# Patient Record
Sex: Male | Born: 1940 | Race: White | Hispanic: No | Marital: Married | Smoking: Never smoker
Health system: Southern US, Community
[De-identification: ages and names within clinical notes are randomized; demographics above are authoritative.]

## PROBLEM LIST (undated history)

## (undated) ENCOUNTER — Emergency Department (HOSPITAL_BASED_OUTPATIENT_CLINIC_OR_DEPARTMENT_OTHER): Payer: Medicare Other | Source: Home / Self Care

## (undated) DIAGNOSIS — I7789 Other specified disorders of arteries and arterioles: Secondary | ICD-10-CM

## (undated) DIAGNOSIS — I499 Cardiac arrhythmia, unspecified: Secondary | ICD-10-CM

## (undated) DIAGNOSIS — F528 Other sexual dysfunction not due to a substance or known physiological condition: Secondary | ICD-10-CM

## (undated) DIAGNOSIS — R0602 Shortness of breath: Secondary | ICD-10-CM

## (undated) DIAGNOSIS — I4891 Unspecified atrial fibrillation: Secondary | ICD-10-CM

## (undated) DIAGNOSIS — R7303 Prediabetes: Secondary | ICD-10-CM

## (undated) DIAGNOSIS — Z952 Presence of prosthetic heart valve: Secondary | ICD-10-CM

## (undated) DIAGNOSIS — I019 Acute rheumatic heart disease, unspecified: Secondary | ICD-10-CM

## (undated) DIAGNOSIS — K5790 Diverticulosis of intestine, part unspecified, without perforation or abscess without bleeding: Secondary | ICD-10-CM

## (undated) DIAGNOSIS — I080 Rheumatic disorders of both mitral and aortic valves: Secondary | ICD-10-CM

## (undated) DIAGNOSIS — G5620 Lesion of ulnar nerve, unspecified upper limb: Secondary | ICD-10-CM

## (undated) DIAGNOSIS — I509 Heart failure, unspecified: Secondary | ICD-10-CM

## (undated) DIAGNOSIS — I712 Thoracic aortic aneurysm, without rupture, unspecified: Secondary | ICD-10-CM

## (undated) DIAGNOSIS — E785 Hyperlipidemia, unspecified: Secondary | ICD-10-CM

## (undated) DIAGNOSIS — I639 Cerebral infarction, unspecified: Secondary | ICD-10-CM

## (undated) DIAGNOSIS — I010 Acute rheumatic pericarditis: Secondary | ICD-10-CM

## (undated) DIAGNOSIS — Z9889 Other specified postprocedural states: Secondary | ICD-10-CM

## (undated) DIAGNOSIS — K644 Residual hemorrhoidal skin tags: Secondary | ICD-10-CM

## (undated) HISTORY — PX: CARDIOVERSION: SHX1299

## (undated) HISTORY — DX: Shortness of breath: R06.02

## (undated) HISTORY — PX: COLONOSCOPY: SHX174

## (undated) HISTORY — DX: Lesion of ulnar nerve, unspecified upper limb: G56.20

## (undated) HISTORY — DX: Hyperlipidemia, unspecified: E78.5

## (undated) HISTORY — DX: Other specified disorders of arteries and arterioles: I77.89

## (undated) HISTORY — PX: AORTIC AND MITRAL VALVE REPLACEMENT: SHX5056

## (undated) HISTORY — DX: Other specified postprocedural states: Z98.890

## (undated) HISTORY — PX: TONSILLECTOMY: SUR1361

## (undated) HISTORY — DX: Diverticulosis of intestine, part unspecified, without perforation or abscess without bleeding: K57.90

## (undated) HISTORY — DX: Rheumatic disorders of both mitral and aortic valves: I08.0

## (undated) HISTORY — DX: Acute rheumatic pericarditis: I01.0

## (undated) HISTORY — DX: Cerebral infarction, unspecified: I63.9

## (undated) HISTORY — DX: Acute rheumatic heart disease, unspecified: I01.9

## (undated) HISTORY — DX: Unspecified atrial fibrillation: I48.91

## (undated) HISTORY — DX: Residual hemorrhoidal skin tags: K64.4

## (undated) HISTORY — DX: Other sexual dysfunction not due to a substance or known physiological condition: F52.8

## (undated) HISTORY — PX: OTHER SURGICAL HISTORY: SHX169

## (undated) SURGERY — CARDIOVERSION
Anesthesia: Monitor Anesthesia Care

---

## 1997-11-13 ENCOUNTER — Ambulatory Visit (HOSPITAL_COMMUNITY): Admission: RE | Admit: 1997-11-13 | Discharge: 1997-11-13 | Payer: Self-pay | Admitting: Cardiology

## 1999-02-10 ENCOUNTER — Other Ambulatory Visit: Admission: RE | Admit: 1999-02-10 | Discharge: 1999-02-10 | Payer: Self-pay | Admitting: Gastroenterology

## 1999-02-10 ENCOUNTER — Encounter (INDEPENDENT_AMBULATORY_CARE_PROVIDER_SITE_OTHER): Payer: Self-pay | Admitting: Specialist

## 2002-07-14 ENCOUNTER — Encounter: Payer: Self-pay | Admitting: Emergency Medicine

## 2002-07-14 ENCOUNTER — Inpatient Hospital Stay (HOSPITAL_COMMUNITY): Admission: EM | Admit: 2002-07-14 | Discharge: 2002-07-16 | Payer: Self-pay | Admitting: Emergency Medicine

## 2002-07-15 ENCOUNTER — Encounter: Payer: Self-pay | Admitting: Internal Medicine

## 2004-01-06 ENCOUNTER — Ambulatory Visit: Payer: Self-pay

## 2004-01-15 ENCOUNTER — Ambulatory Visit: Payer: Self-pay | Admitting: Internal Medicine

## 2004-01-20 ENCOUNTER — Emergency Department (HOSPITAL_COMMUNITY): Admission: EM | Admit: 2004-01-20 | Discharge: 2004-01-20 | Payer: Self-pay | Admitting: Emergency Medicine

## 2004-01-21 ENCOUNTER — Ambulatory Visit: Payer: Self-pay

## 2004-02-25 ENCOUNTER — Ambulatory Visit: Payer: Self-pay | Admitting: Internal Medicine

## 2004-03-09 ENCOUNTER — Ambulatory Visit: Payer: Self-pay

## 2004-03-23 ENCOUNTER — Ambulatory Visit: Payer: Self-pay | Admitting: Internal Medicine

## 2004-03-25 ENCOUNTER — Ambulatory Visit: Payer: Self-pay | Admitting: Cardiology

## 2004-04-01 ENCOUNTER — Ambulatory Visit: Payer: Self-pay | Admitting: Internal Medicine

## 2004-04-27 ENCOUNTER — Ambulatory Visit: Payer: Self-pay | Admitting: *Deleted

## 2004-04-30 ENCOUNTER — Ambulatory Visit: Payer: Self-pay

## 2004-05-11 ENCOUNTER — Ambulatory Visit: Payer: Self-pay | Admitting: Cardiovascular Disease

## 2004-05-27 ENCOUNTER — Ambulatory Visit: Payer: Self-pay | Admitting: Cardiology

## 2004-06-01 ENCOUNTER — Emergency Department (HOSPITAL_COMMUNITY): Admission: EM | Admit: 2004-06-01 | Discharge: 2004-06-02 | Payer: Self-pay | Admitting: Emergency Medicine

## 2004-06-02 ENCOUNTER — Emergency Department (HOSPITAL_COMMUNITY): Admission: EM | Admit: 2004-06-02 | Discharge: 2004-06-02 | Payer: Self-pay | Admitting: Emergency Medicine

## 2004-06-24 ENCOUNTER — Ambulatory Visit: Payer: Self-pay | Admitting: Internal Medicine

## 2004-06-26 ENCOUNTER — Emergency Department (HOSPITAL_COMMUNITY): Admission: EM | Admit: 2004-06-26 | Discharge: 2004-06-26 | Payer: Self-pay | Admitting: Emergency Medicine

## 2004-07-13 ENCOUNTER — Ambulatory Visit: Payer: Self-pay | Admitting: Cardiology

## 2004-07-26 ENCOUNTER — Ambulatory Visit: Payer: Self-pay | Admitting: *Deleted

## 2004-07-27 ENCOUNTER — Observation Stay (HOSPITAL_COMMUNITY): Admission: EM | Admit: 2004-07-27 | Discharge: 2004-07-28 | Payer: Self-pay | Admitting: Emergency Medicine

## 2004-07-28 ENCOUNTER — Encounter: Payer: Self-pay | Admitting: Cardiology

## 2004-08-04 ENCOUNTER — Ambulatory Visit: Payer: Self-pay | Admitting: Cardiology

## 2004-08-18 ENCOUNTER — Ambulatory Visit: Payer: Self-pay | Admitting: Cardiology

## 2004-08-19 ENCOUNTER — Ambulatory Visit: Payer: Self-pay

## 2004-08-25 ENCOUNTER — Ambulatory Visit: Payer: Self-pay | Admitting: Cardiology

## 2004-09-08 ENCOUNTER — Ambulatory Visit: Payer: Self-pay | Admitting: Internal Medicine

## 2004-09-13 ENCOUNTER — Ambulatory Visit: Payer: Self-pay | Admitting: Cardiology

## 2004-09-13 ENCOUNTER — Ambulatory Visit: Payer: Self-pay

## 2004-09-14 ENCOUNTER — Ambulatory Visit: Payer: Self-pay | Admitting: Cardiology

## 2004-09-30 ENCOUNTER — Ambulatory Visit: Payer: Self-pay | Admitting: Internal Medicine

## 2004-10-04 ENCOUNTER — Ambulatory Visit: Payer: Self-pay | Admitting: Internal Medicine

## 2004-10-07 ENCOUNTER — Ambulatory Visit: Payer: Self-pay | Admitting: Internal Medicine

## 2004-10-07 ENCOUNTER — Ambulatory Visit: Payer: Self-pay | Admitting: Cardiology

## 2004-10-12 ENCOUNTER — Ambulatory Visit: Payer: Self-pay | Admitting: Internal Medicine

## 2004-10-14 ENCOUNTER — Ambulatory Visit: Payer: Self-pay | Admitting: Internal Medicine

## 2004-10-18 ENCOUNTER — Ambulatory Visit: Payer: Self-pay | Admitting: Cardiology

## 2004-10-26 ENCOUNTER — Ambulatory Visit: Payer: Self-pay | Admitting: Internal Medicine

## 2004-10-27 ENCOUNTER — Ambulatory Visit: Payer: Self-pay | Admitting: Cardiology

## 2004-11-03 ENCOUNTER — Ambulatory Visit: Payer: Self-pay | Admitting: Cardiology

## 2004-11-03 ENCOUNTER — Ambulatory Visit: Payer: Self-pay | Admitting: *Deleted

## 2004-11-11 ENCOUNTER — Encounter (HOSPITAL_COMMUNITY): Admission: RE | Admit: 2004-11-11 | Discharge: 2005-01-29 | Payer: Self-pay | Admitting: Cardiology

## 2004-11-18 ENCOUNTER — Ambulatory Visit: Payer: Self-pay | Admitting: Internal Medicine

## 2004-12-02 ENCOUNTER — Ambulatory Visit: Payer: Self-pay | Admitting: Cardiology

## 2004-12-07 ENCOUNTER — Ambulatory Visit: Payer: Self-pay | Admitting: Cardiology

## 2004-12-15 ENCOUNTER — Ambulatory Visit: Payer: Self-pay | Admitting: Cardiology

## 2005-01-12 ENCOUNTER — Ambulatory Visit: Payer: Self-pay | Admitting: Cardiology

## 2005-02-16 ENCOUNTER — Ambulatory Visit: Payer: Self-pay | Admitting: Cardiology

## 2005-03-09 ENCOUNTER — Ambulatory Visit (HOSPITAL_COMMUNITY): Admission: RE | Admit: 2005-03-09 | Discharge: 2005-03-10 | Payer: Self-pay | Admitting: Neurosurgery

## 2005-03-14 ENCOUNTER — Encounter: Payer: Self-pay | Admitting: Cardiology

## 2005-03-14 ENCOUNTER — Ambulatory Visit: Payer: Self-pay

## 2005-03-16 ENCOUNTER — Ambulatory Visit: Payer: Self-pay | Admitting: Cardiology

## 2005-05-26 ENCOUNTER — Ambulatory Visit: Payer: Self-pay | Admitting: Cardiology

## 2005-07-28 ENCOUNTER — Ambulatory Visit: Payer: Self-pay | Admitting: Cardiology

## 2005-08-04 ENCOUNTER — Ambulatory Visit: Payer: Self-pay | Admitting: Cardiology

## 2006-02-09 ENCOUNTER — Ambulatory Visit: Payer: Self-pay | Admitting: Cardiology

## 2006-02-17 ENCOUNTER — Ambulatory Visit: Payer: Self-pay | Admitting: Cardiology

## 2006-03-14 ENCOUNTER — Ambulatory Visit: Payer: Self-pay

## 2006-03-14 ENCOUNTER — Encounter: Payer: Self-pay | Admitting: Cardiology

## 2006-08-31 ENCOUNTER — Ambulatory Visit: Payer: Self-pay | Admitting: Cardiology

## 2006-08-31 LAB — CONVERTED CEMR LAB
ALT: 64 units/L — ABNORMAL HIGH (ref 0–53)
AST: 48 units/L — ABNORMAL HIGH (ref 0–37)
Albumin: 4 g/dL (ref 3.5–5.2)
Alkaline Phosphatase: 64 units/L (ref 39–117)
Bilirubin, Direct: 0.1 mg/dL (ref 0.0–0.3)
Total Bilirubin: 1.2 mg/dL (ref 0.3–1.2)
Total Protein: 7.1 g/dL (ref 6.0–8.3)

## 2006-09-07 ENCOUNTER — Ambulatory Visit: Payer: Self-pay | Admitting: Cardiology

## 2006-09-11 ENCOUNTER — Ambulatory Visit: Payer: Self-pay | Admitting: Cardiology

## 2006-09-11 LAB — CONVERTED CEMR LAB
BUN: 22 mg/dL (ref 6–23)
Basophils Absolute: 0.1 10*3/uL (ref 0.0–0.1)
Basophils Relative: 1 % (ref 0.0–1.0)
CO2: 31 meq/L (ref 19–32)
Calcium: 8.9 mg/dL (ref 8.4–10.5)
Chloride: 111 meq/L (ref 96–112)
Creatinine, Ser: 1 mg/dL (ref 0.4–1.5)
Eosinophils Absolute: 0.2 10*3/uL (ref 0.0–0.6)
Eosinophils Relative: 3.1 % (ref 0.0–5.0)
GFR calc Af Amer: 97 mL/min
GFR calc non Af Amer: 80 mL/min
Glucose, Bld: 108 mg/dL — ABNORMAL HIGH (ref 70–99)
HCT: 41.6 % (ref 39.0–52.0)
Hemoglobin: 14.1 g/dL (ref 13.0–17.0)
Lymphocytes Relative: 28.6 % (ref 12.0–46.0)
MCHC: 33.8 g/dL (ref 30.0–36.0)
MCV: 93.3 fL (ref 78.0–100.0)
Monocytes Absolute: 0.6 10*3/uL (ref 0.2–0.7)
Monocytes Relative: 8.9 % (ref 3.0–11.0)
Neutro Abs: 3.6 10*3/uL (ref 1.4–7.7)
Neutrophils Relative %: 58.4 % (ref 43.0–77.0)
Platelets: 259 10*3/uL (ref 150–400)
Potassium: 4.4 meq/L (ref 3.5–5.1)
RBC: 4.46 M/uL (ref 4.22–5.81)
RDW: 11.7 % (ref 11.5–14.6)
Sodium: 145 meq/L (ref 135–145)
WBC: 6.3 10*3/uL (ref 4.5–10.5)

## 2007-03-14 ENCOUNTER — Ambulatory Visit: Payer: Self-pay

## 2007-03-20 ENCOUNTER — Ambulatory Visit: Payer: Self-pay | Admitting: Cardiology

## 2007-03-20 LAB — CONVERTED CEMR LAB
ALT: 49 units/L (ref 0–53)
AST: 43 units/L — ABNORMAL HIGH (ref 0–37)
Albumin: 4.3 g/dL (ref 3.5–5.2)
Alkaline Phosphatase: 78 units/L (ref 39–117)
BUN: 22 mg/dL (ref 6–23)
Bilirubin, Direct: 0.1 mg/dL (ref 0.0–0.3)
CO2: 24 meq/L (ref 19–32)
Calcium: 9.5 mg/dL (ref 8.4–10.5)
Chloride: 105 meq/L (ref 96–112)
Cholesterol: 134 mg/dL (ref 0–200)
Creatinine, Ser: 1.1 mg/dL (ref 0.4–1.5)
GFR calc Af Amer: 87 mL/min
GFR calc non Af Amer: 72 mL/min
Glucose, Bld: 91 mg/dL (ref 70–99)
HDL: 34.4 mg/dL — ABNORMAL LOW (ref >39.0)
LDL Cholesterol: 83 mg/dL (ref 0–99)
Potassium: 4.3 meq/L (ref 3.5–5.1)
Sodium: 139 meq/L (ref 135–145)
Total Bilirubin: 1.5 mg/dL — ABNORMAL HIGH (ref 0.3–1.2)
Total CHOL/HDL Ratio: 3.9
Total Protein: 7.9 g/dL (ref 6.0–8.3)
Triglycerides: 85 mg/dL (ref 0–149)
VLDL: 17 mg/dL (ref 0–40)

## 2007-03-26 ENCOUNTER — Ambulatory Visit: Payer: Self-pay | Admitting: Cardiology

## 2007-05-29 ENCOUNTER — Ambulatory Visit: Payer: Self-pay | Admitting: Gastroenterology

## 2007-06-29 ENCOUNTER — Encounter: Payer: Self-pay | Admitting: Internal Medicine

## 2007-06-29 ENCOUNTER — Ambulatory Visit: Payer: Self-pay | Admitting: Gastroenterology

## 2007-09-24 ENCOUNTER — Ambulatory Visit: Payer: Self-pay | Admitting: Internal Medicine

## 2007-09-24 DIAGNOSIS — I019 Acute rheumatic heart disease, unspecified: Secondary | ICD-10-CM | POA: Insufficient documentation

## 2007-09-24 DIAGNOSIS — I480 Paroxysmal atrial fibrillation: Secondary | ICD-10-CM | POA: Insufficient documentation

## 2007-09-25 ENCOUNTER — Ambulatory Visit: Payer: Self-pay | Admitting: Cardiology

## 2007-09-25 DIAGNOSIS — I080 Rheumatic disorders of both mitral and aortic valves: Secondary | ICD-10-CM | POA: Insufficient documentation

## 2007-09-25 DIAGNOSIS — K644 Residual hemorrhoidal skin tags: Secondary | ICD-10-CM | POA: Insufficient documentation

## 2007-09-25 DIAGNOSIS — G5620 Lesion of ulnar nerve, unspecified upper limb: Secondary | ICD-10-CM | POA: Insufficient documentation

## 2007-09-25 DIAGNOSIS — F528 Other sexual dysfunction not due to a substance or known physiological condition: Secondary | ICD-10-CM | POA: Insufficient documentation

## 2007-09-25 DIAGNOSIS — I010 Acute rheumatic pericarditis: Secondary | ICD-10-CM | POA: Insufficient documentation

## 2008-01-04 ENCOUNTER — Telehealth: Payer: Self-pay | Admitting: Internal Medicine

## 2008-01-09 ENCOUNTER — Ambulatory Visit: Payer: Self-pay | Admitting: Cardiology

## 2008-01-09 LAB — CONVERTED CEMR LAB
BUN: 20 mg/dL (ref 6–23)
CO2: 28 meq/L (ref 19–32)
Calcium: 9.4 mg/dL (ref 8.4–10.5)
Chloride: 106 meq/L (ref 96–112)
Creatinine, Ser: 1 mg/dL (ref 0.4–1.5)
GFR calc Af Amer: 96 mL/min
GFR calc non Af Amer: 80 mL/min
Glucose, Bld: 104 mg/dL — ABNORMAL HIGH (ref 70–99)
Potassium: 4.5 meq/L (ref 3.5–5.1)
Sodium: 140 meq/L (ref 135–145)
TSH: 2.13 microintl units/mL (ref 0.35–5.50)

## 2008-01-10 ENCOUNTER — Ambulatory Visit: Payer: Self-pay

## 2008-01-28 ENCOUNTER — Ambulatory Visit: Payer: Self-pay | Admitting: Cardiology

## 2008-03-27 ENCOUNTER — Encounter: Payer: Self-pay | Admitting: Internal Medicine

## 2008-05-05 ENCOUNTER — Ambulatory Visit: Payer: Self-pay | Admitting: Internal Medicine

## 2008-06-16 DIAGNOSIS — Z86010 Personal history of colonic polyps: Secondary | ICD-10-CM | POA: Insufficient documentation

## 2008-06-17 ENCOUNTER — Ambulatory Visit: Payer: Self-pay | Admitting: Gastroenterology

## 2008-06-17 DIAGNOSIS — K5730 Diverticulosis of large intestine without perforation or abscess without bleeding: Secondary | ICD-10-CM | POA: Insufficient documentation

## 2008-07-03 ENCOUNTER — Ambulatory Visit: Payer: Self-pay

## 2008-07-03 ENCOUNTER — Encounter: Payer: Self-pay | Admitting: Cardiology

## 2008-07-07 DIAGNOSIS — I471 Supraventricular tachycardia, unspecified: Secondary | ICD-10-CM | POA: Insufficient documentation

## 2008-07-07 DIAGNOSIS — Z9889 Other specified postprocedural states: Secondary | ICD-10-CM | POA: Insufficient documentation

## 2008-07-07 DIAGNOSIS — E785 Hyperlipidemia, unspecified: Secondary | ICD-10-CM | POA: Insufficient documentation

## 2008-07-08 ENCOUNTER — Ambulatory Visit: Payer: Self-pay | Admitting: Cardiology

## 2008-07-10 ENCOUNTER — Ambulatory Visit: Payer: Self-pay | Admitting: Cardiology

## 2008-07-10 DIAGNOSIS — I4891 Unspecified atrial fibrillation: Secondary | ICD-10-CM | POA: Insufficient documentation

## 2008-07-10 DIAGNOSIS — I4819 Other persistent atrial fibrillation: Secondary | ICD-10-CM | POA: Insufficient documentation

## 2008-07-10 LAB — CONVERTED CEMR LAB
ALT: 49 units/L (ref 0–53)
AST: 42 units/L — ABNORMAL HIGH (ref 0–37)
Albumin: 3.8 g/dL (ref 3.5–5.2)
Alkaline Phosphatase: 59 units/L (ref 39–117)
Bilirubin, Direct: 0.1 mg/dL (ref 0.0–0.3)
Cholesterol: 144 mg/dL (ref 0–200)
HDL: 39.5 mg/dL (ref >39.00)
LDL Cholesterol: 88 mg/dL (ref 0–99)
Total Bilirubin: 1.1 mg/dL (ref 0.3–1.2)
Total CHOL/HDL Ratio: 4
Total Protein: 6.7 g/dL (ref 6.0–8.3)
Triglycerides: 83 mg/dL (ref 0.0–149.0)
VLDL: 16.6 mg/dL (ref 0.0–40.0)

## 2008-12-25 ENCOUNTER — Ambulatory Visit: Payer: Self-pay | Admitting: Internal Medicine

## 2008-12-26 LAB — CONVERTED CEMR LAB
ALT: 50 units/L (ref 0–53)
AST: 35 units/L (ref 0–37)
Albumin: 4.1 g/dL (ref 3.5–5.2)
Alkaline Phosphatase: 56 units/L (ref 39–117)
BUN: 19 mg/dL (ref 6–23)
Bilirubin, Direct: 0.1 mg/dL (ref 0.0–0.3)
CO2: 28 meq/L (ref 19–32)
Calcium: 8.7 mg/dL (ref 8.4–10.5)
Chloride: 105 meq/L (ref 96–112)
Cholesterol: 147 mg/dL (ref 0–200)
Creatinine, Ser: 0.9 mg/dL (ref 0.4–1.5)
GFR calc non Af Amer: 89.67 mL/min (ref >60)
Glucose, Bld: 103 mg/dL — ABNORMAL HIGH (ref 70–99)
HDL: 43.3 mg/dL (ref >39.00)
LDL Cholesterol: 88 mg/dL (ref 0–99)
PSA: 2.87 ng/mL (ref 0.10–4.00)
Potassium: 4.2 meq/L (ref 3.5–5.1)
Sodium: 141 meq/L (ref 135–145)
TSH: 2.24 microintl units/mL (ref 0.35–5.50)
Total Bilirubin: 1.1 mg/dL (ref 0.3–1.2)
Total CHOL/HDL Ratio: 3
Total Protein: 7 g/dL (ref 6.0–8.3)
Triglycerides: 77 mg/dL (ref 0.0–149.0)
VLDL: 15.4 mg/dL (ref 0.0–40.0)

## 2009-01-21 ENCOUNTER — Ambulatory Visit: Payer: Self-pay | Admitting: Internal Medicine

## 2009-01-21 DIAGNOSIS — J069 Acute upper respiratory infection, unspecified: Secondary | ICD-10-CM | POA: Insufficient documentation

## 2009-01-23 ENCOUNTER — Ambulatory Visit: Payer: Self-pay | Admitting: Internal Medicine

## 2009-01-27 ENCOUNTER — Ambulatory Visit: Payer: Self-pay | Admitting: Cardiology

## 2009-04-10 ENCOUNTER — Encounter: Payer: Self-pay | Admitting: Internal Medicine

## 2009-04-10 ENCOUNTER — Ambulatory Visit: Payer: Self-pay | Admitting: Cardiology

## 2009-04-10 DIAGNOSIS — R5383 Other fatigue: Secondary | ICD-10-CM

## 2009-04-10 DIAGNOSIS — R5381 Other malaise: Secondary | ICD-10-CM | POA: Insufficient documentation

## 2009-04-10 LAB — CONVERTED CEMR LAB
Basophils Absolute: 0 10*3/uL (ref 0.0–0.1)
Basophils Relative: 0 % (ref 0–1)
Eosinophils Absolute: 0.2 10*3/uL (ref 0.0–0.7)
Eosinophils Relative: 3 % (ref 0–5)
HCT: 40.9 % (ref 39.0–52.0)
Hemoglobin: 13.7 g/dL (ref 13.0–17.0)
Lymphocytes Relative: 26 % (ref 12–46)
Lymphs Abs: 2.4 10*3/uL (ref 0.7–4.0)
MCHC: 33.5 g/dL (ref 30.0–36.0)
MCV: 92.1 fL (ref 78.0–100.0)
Monocytes Absolute: 0.9 10*3/uL (ref 0.1–1.0)
Monocytes Relative: 10 % (ref 3–12)
Neutro Abs: 5.6 10*3/uL (ref 1.7–7.7)
Neutrophils Relative %: 61 % (ref 43–77)
Platelets: 255 10*3/uL (ref 150–400)
RBC: 4.44 M/uL (ref 4.22–5.81)
RDW: 13.7 % (ref 11.5–15.5)
WBC: 9.2 10*3/uL (ref 4.0–10.5)

## 2009-05-05 ENCOUNTER — Telehealth: Payer: Self-pay | Admitting: Cardiology

## 2009-07-23 ENCOUNTER — Ambulatory Visit (HOSPITAL_COMMUNITY): Admission: RE | Admit: 2009-07-23 | Discharge: 2009-07-23 | Payer: Self-pay | Admitting: Cardiology

## 2009-07-23 ENCOUNTER — Encounter: Payer: Self-pay | Admitting: Cardiology

## 2009-07-23 ENCOUNTER — Ambulatory Visit: Payer: Self-pay | Admitting: Internal Medicine

## 2009-07-23 ENCOUNTER — Ambulatory Visit: Payer: Self-pay

## 2009-07-28 ENCOUNTER — Ambulatory Visit: Payer: Self-pay | Admitting: Cardiology

## 2009-08-11 ENCOUNTER — Ambulatory Visit: Payer: Self-pay | Admitting: Cardiology

## 2009-08-12 LAB — CONVERTED CEMR LAB
ALT: 34 units/L (ref 0–53)
AST: 33 units/L (ref 0–37)
Albumin: 4 g/dL (ref 3.5–5.2)
Alkaline Phosphatase: 55 units/L (ref 39–117)
BUN: 21 mg/dL (ref 6–23)
Basophils Absolute: 0.1 10*3/uL (ref 0.0–0.1)
Basophils Relative: 0.9 % (ref 0.0–3.0)
Bilirubin, Direct: 0.2 mg/dL (ref 0.0–0.3)
CO2: 26 meq/L (ref 19–32)
Calcium: 9.1 mg/dL (ref 8.4–10.5)
Chloride: 109 meq/L (ref 96–112)
Cholesterol: 168 mg/dL (ref 0–200)
Creatinine, Ser: 0.8 mg/dL (ref 0.4–1.5)
Eosinophils Absolute: 0.3 10*3/uL (ref 0.0–0.7)
Eosinophils Relative: 4.1 % (ref 0.0–5.0)
GFR calc non Af Amer: 102.53 mL/min (ref >60)
Glucose, Bld: 101 mg/dL — ABNORMAL HIGH (ref 70–99)
HCT: 39.5 % (ref 39.0–52.0)
HDL: 48.7 mg/dL (ref >39.00)
Hemoglobin: 13.7 g/dL (ref 13.0–17.0)
LDL Cholesterol: 99 mg/dL (ref 0–99)
Lymphocytes Relative: 25.2 % (ref 12.0–46.0)
Lymphs Abs: 1.9 10*3/uL (ref 0.7–4.0)
MCHC: 34.7 g/dL (ref 30.0–36.0)
MCV: 94.1 fL (ref 78.0–100.0)
Monocytes Absolute: 0.6 10*3/uL (ref 0.1–1.0)
Monocytes Relative: 7.9 % (ref 3.0–12.0)
Neutro Abs: 4.7 10*3/uL (ref 1.4–7.7)
Neutrophils Relative %: 61.9 % (ref 43.0–77.0)
Platelets: 225 10*3/uL (ref 150.0–400.0)
Potassium: 4.2 meq/L (ref 3.5–5.1)
RBC: 4.2 M/uL — ABNORMAL LOW (ref 4.22–5.81)
RDW: 13.3 % (ref 11.5–14.6)
Sodium: 143 meq/L (ref 135–145)
Total Bilirubin: 0.8 mg/dL (ref 0.3–1.2)
Total CHOL/HDL Ratio: 3
Total Protein: 6.3 g/dL (ref 6.0–8.3)
Triglycerides: 104 mg/dL (ref 0.0–149.0)
VLDL: 20.8 mg/dL (ref 0.0–40.0)
WBC: 7.6 10*3/uL (ref 4.5–10.5)

## 2010-01-05 ENCOUNTER — Ambulatory Visit: Payer: Self-pay | Admitting: Internal Medicine

## 2010-01-25 ENCOUNTER — Ambulatory Visit: Payer: Self-pay | Admitting: Cardiology

## 2010-03-12 ENCOUNTER — Telehealth: Payer: Self-pay | Admitting: Cardiology

## 2010-04-08 NOTE — Assessment & Plan Note (Signed)
Summary: f59m   Visit Type:  Follow-up Primary Provider:  Jacques Navy MD   History of Present Illness: Patient is 70 years old and return for management of valvular heart disease. 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 Tissue valves were used. The procedure was done for aortic and mitral insufficiency and recent onset CHF. He had a history of atrial fibrillation preoperatively but has not had any documented atrial fibrillation postoperatively. He has had some episodes of atrial tachycardia and short runs of nonsustained ventricular tachycardia. He has been managed with Toprol for this. We increased his dose to 200 but his heart rate got to slowly cut him back to 150 mg daily.  He has been feeling well and exercising regularly. He does have occasional runs of palpitations but these only last a short time and not very bothersome.  I talked to him about seeing Dr. Shirlee Latch next year after I retire.  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 Alford Highland nephew, Landis Gandy, was a partner in his firm in [place] office. The patient also notes a history of the [place] migration of the [place] to [place].  Current Medications (verified): 1)  Crestor 20 Mg Tabs (Rosuvastatin Calcium) .... Take One Tablet By Mouth Daily. 2)  Metoprolol Succinate 100 Mg Xr24h-Tab (Metoprolol Succinate) .... Take 1 & 1/2 Tabs By Mouth Once Daily 3)  Aspirin 81 Mg  Tabs (Aspirin) .... Take 1 Tablet By Mouth Once A Day 4)  Vitamin-B Complex   Tabs (B Complex Vitamins) .... Take 1 Tablet By Mouth Once A Day 5)  Coq- 10 300 Mg .... Take 1 Tablet By Mouth Once A Day 6)  Bilberry 100 Mg Caps (Bilberry (Vaccinium Myrtillus)) .... Take One Tab By Mouth Once Daily 7)  Saw Palmetto 160 Mg  Tabs (Saw Palmetto (Serenoa Repens)) .... Take One Tablet By Mouth Once A Day 8)  Multivitamins   Tabs (Multiple Vitamin) .... Take 1  Tablet By Mouth Once A Day 9)  Viagra 100 Mg Tabs (Sildenafil Citrate) .... 1/2 To 1 Once Daily As Needed 10)  Fish Oil 1000 Mg Caps (Omega-3 Fatty Acids) .... Take 1 Capsule By Mouth Three Times A Day 11)  Vitamin B-6 100 Mg Tabs (Pyridoxine Hcl) .... Take 1 Tablet By Mouth Once A Day 12)  Pramosone 1-2.5 % Crea (Pramoxine-Hc) .... Apply Two Times A Day As Needed For Hemorrhoids.  Allergies: 1)  ! Naprosyn  Past History:  Past Medical History: Reviewed history from 01/05/2010 and no changes required. 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  1. Atrial tachycardia and NSVT 05/2008 Consult [facility] 2. 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 3. History of atrial fibrillation prior to valvular heart surgery, now     off Coumadin. 4. Hyperlipidemia. 5. History of mild elevation of liver function tests. 6. History of remote pericarditis. 7. Status post lumbar disk surgery.   Physician Roster:  cardiology - Dr. Shirlee Latch               dermatology - Dr. Dorinda Hill               GI- Dr. Jarold Motto               Opthal - Sallie MIller OD               NS - Dr. Trey Sailors  Review of Systems       ROS is negative except as outlined in HPI.   Vital Signs:  Patient profile:   70 year old male Height:      68 inches Weight:      200 pounds BMI:     30.52 Pulse rate:   66 / minute BP sitting:   130 / 80  (left arm)  Vitals Entered By: Laurance Flatten CMA  (January 25, 2010 3:14 PM)  Physical Exam  Additional Exam:  Gen. Well-nourished, in no distress   Neck: No JVD, thyroid not enlarged, no carotid bruits Lungs: No tachypnea, clear without rales, rhonchi or wheezes Cardiovascular: Rhythm regular, PMI not displaced,  heart sounds  normal, grade 2/6 systolic ejection murmur at the left sternal edge, no peripheral edema, pulses normal in all 4 extremities. Abdomen: BS normal, abdomen soft and non-tender without masses or organomegaly, no hepatosplenomegaly. MS: No deformities, no cyanosis or clubbing   Neuro:  No focal sns   Skin:  no lesions    Impression & Recommendations:  Problem # 1:  HEART VALVE REPLACEMENT, HX OF (ICD-V15.1) the patient is status post aortic and mitral valve replacement at the [facility] clinic with tissue valves. His LV function was slightly depressed after surgery but has returned to normal. Is not having any symptoms and this problem appears stable.  Problem # 2:  ATRIAL FIBRILLATION (ICD-427.31) He had a history of atrial fibrillation prior to surgery. He had a Maze procedure during surgery. He has had some short episodes of SVT and very short runs of VT since surgery documented by monitoring. He's had no recent palpitations and this problem appears stable. He is off Coumadin. His updated medication list for this problem includes:    Metoprolol Succinate 100 Mg Xr24h-tab (Metoprolol succinate) .[name]... Take 1 & 1/2 tabs by mouth once daily    Aspirin 81 Mg Tabs (Aspirin) .[name]... Take 1 tablet by mouth once a day  Problem # 3:  HYPERLIPIDEMIA-MIXED (ICD-272.4) This is managed with Crestor. His updated medication list for this problem includes:    Crestor 20 Mg Tabs (Rosuvastatin calcium) .[name]... Take one tablet by mouth daily.  Other Orders: EKG w/ Interpretation (93000)  Patient Instructions: 1)  Your physician wants you to follow-up in: 6 months with Dr. Shirlee Latch.  You will receive a reminder letter in the mail two  months in advance. If you don't receive a letter, please call our office to schedule the follow-up appointment. 2)  Your physician recommends that you continue on your current medications as directed. Please refer to the Current Medication list given to you today. Prescriptions: METOPROLOL SUCCINATE 100 MG XR24H-TAB (METOPROLOL SUCCINATE) take 1 & 1/2 tabs by mouth once daily  #135 x 3   Entered by:   Sherri Rad, RN, BSN   Authorized by:   Lenoria Farrier, MD, [facility]   Signed by:   Sherri Rad, RN, BSN on 01/25/2010   Method used:   Faxed to .Marland KitchenMarland Kitchen  [facility] (mail-order)             , [place]         Ph: [phone]       Fax: [phone]   RxID:   [id number] CRESTOR 20 MG TABS (ROSUVASTATIN CALCIUM) Take one tablet by mouth daily.  #90 x 3   Entered by:   Sherri Rad, RN, BSN   Authorized by:   Lenoria Farrier, MD, [facility]   Signed by:   Sherri Rad, RN, BSN on 01/25/2010   Method used:   Faxed to ...       [facility] (mail-order)             , [place]         Ph: [phone]       Fax: [phone]   RxID:   [id number]

## 2010-04-08 NOTE — Progress Notes (Signed)
Summary: pt rtn call on medication   Phone Note Call from Patient Call back at [phone]   Caller: Patient Reason for Call: Talk to Nurse, Talk to Doctor Summary of Call: pt calling regarding medication. per pt Dr. Juanda Chance called him and asked him to get in touch with heather m/lg Initial call taken by: Omer Jack,  May 05, 2009 10:46 AM  Follow-up for Phone Call        I attempted to call the pt at the number left- VM has not been set up. I will c/b. Sherri Rad, RN, BSN  May 05, 2009 1:09 PM The pt called back today. He stated he did not end up getting the last refill we sent in on Vytorin since he recieved a letter stating that it was no longer covered. He does want to go ahead and change to Crestor. I will verify the dose with Dr. Juanda Chance and send in a new rx to [facility]. I advised the pt he will need a repeat lipid/liver profile 6 weeks after starting Crestor. The pt verbalizes understanding. Sherri Rad, RN, BSN  May 05, 2009 2:05 PM  Per Dr. Juanda Chance, the pt should be on Crestor 20mg  once daily. I will send this to [facility].  Follow-up by: Sherri Rad, RN, BSN,  May 06, 2009 11:04 AM  Additional Follow-up for Phone Call Additional follow up Details #1::        The pt is aware of the dose and that his RX has been sent to [facility]. Additional Follow-up by: Sherri Rad, RN, BSN,  May 06, 2009 11:07 AM    New/Updated Medications: CRESTOR 20 MG TABS (ROSUVASTATIN CALCIUM) Take one tablet by mouth daily. Prescriptions: CRESTOR 20 MG TABS (ROSUVASTATIN CALCIUM) Take one tablet by mouth daily.  #90 x 3   Entered by:   Sherri Rad, RN, BSN   Authorized by:   Lenoria Farrier, MD, [facility]   Signed by:   Sherri Rad, RN, BSN on 05/06/2009   Method used:   Electronically to        [facility] [facility] (mail-order)             ,          Ph: [phone]       Fax: [phone]   RxID:   [id number]

## 2010-04-08 NOTE — Assessment & Plan Note (Signed)
Summary: per check out   Visit Type:  Follow-up Primary Provider:  Jacques Navy MD  CC:  Intermittent episodes of heart racing.[name]  History of Present Illness: Patient is 70 years old and return for management of valvular heart disease. In 2006 he had aortic and mitral valve replacement and a maze procedure at the [facility] clinic. Tissue valves were used. The procedure was done for aortic and mitral insufficiency and recent onset CHF. He had a history of atrial fibrillation preoperatively but has not had any documented atrial fibrillation postoperatively. He has had some episodes of atrial tachycardia and short runs of nonsustained ventricular tachycardia. He has been managed with Toprol for this. We increased his dose to 200 but his heart rate got to slowly cut him back to 150 mg daily.  He has been feeling well and exercising regularly. He does have occasional runs of palpitations but these only last a short time and not very bothersome.  He and his wife are planning a cruise 2 and will stop in [place] on this fall.  I talked to him about seeing Dr. Shirlee Latch next year after I retire.  Current Medications (verified): 1)  Crestor 20 Mg Tabs (Rosuvastatin Calcium) .... Take One Tablet By Mouth Daily. 2)  Metoprolol Succinate 100 Mg Xr24h-Tab (Metoprolol Succinate) .... Take 1 & 1/2 Tabs By Mouth Once Daily 3)  Aspirin 81 Mg  Tabs (Aspirin) .... Take 1 Tablet By Mouth Once A Day 4)  Vitamin-B Complex   Tabs (B Complex Vitamins) .... Take 1 Tablet By Mouth Once A Day 5)  Coq- 10 300 Mg .... Take 1 Tablet By Mouth Once A Day 6)  Bilberry 100 Mg Caps (Bilberry (Vaccinium Myrtillus)) .... Take One Tab By Mouth Once Daily 7)  Saw Palmetto 160 Mg  Tabs (Saw Palmetto (Serenoa Repens)) .... Take One Tablet By Mouth Once A Day 8)  Multivitamins   Tabs (Multiple Vitamin) .... Take 1 Tablet By Mouth Once A Day 9)  Viagra 100 Mg Tabs (Sildenafil Citrate) .... 1/2 To 1 Once Daily As Needed 10)  Fish Oil  1000 Mg Caps (Omega-3 Fatty Acids) .... Take 1 Capsule By Mouth Three Times A Day 11)  Vitamin B-6 100 Mg Tabs (Pyridoxine Hcl) .... Take 1 Tablet By Mouth Once A Day  Allergies (verified): 1)  ! Naprosyn  Past History:  Past Medical History: Reviewed history from 07/07/2008 and no changes required. 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  1. Atrial tachycardia and NSVT 05/2008 Consult [facility] 2. Valvular heart disease status post aortic and mitral valve     replacements with tissue valve and a maze procedure to [facility] in July 2006. 3. History of atrial fibrillation prior to valvular heart surgery, now     off Coumadin. 4. Hyperlipidemia. 5. History of mild elevation of liver function tests. 6. History of remote pericarditis. 7. Status post lumbar disk surgery.   Review of Systems       ROS is negative except as outlined in HPI.   Vital Signs:  Patient profile:   70 year old male Height:      68 inches Weight:  186.50 pounds Pulse rate:   68 / minute BP sitting:   118 / 72  (left arm) Cuff size:   large  Vitals Entered By: Burnett Kanaris, CNA (Jul 28, 2009 11:20 AM)  Physical Exam  Additional Exam:  Gen. Well-nourished, in no distress   Neck: No JVD, thyroid not enlarged, no carotid bruits Lungs: No tachypnea, clear without rales, rhonchi or wheezes Cardiovascular: Rhythm regular, PMI not displaced,  heart sounds  normal, grade 1-2 systolic ejection murmur at the left sternal edge, no peripheral edema, pulses normal in all 4  extremities. Abdomen: BS normal, abdomen soft and non-tender without masses or organomegaly, no hepatosplenomegaly. MS: No deformities, no cyanosis or clubbing   Neuro:  No focal sns   Skin:  no lesions    Impression & Recommendations:  Problem # 1:  HEART VALVE REPLACEMENT, HX OF (ICD-V15.1) He has had aortic and mitral valve replacements with tissue valves and 2005. He was told at the [facility] clinic at the expected these bouts and last about 15 years. He is doing quite well has had no recent symptoms. An echocardiogram prior to this visit which showed an ejection fraction of 60-65% and good valve function. This polyp is stable.  Problem # 2:  ATRIAL FIBRILLATION (ICD-427.31) He has a history of atrial fibrillation preoperatively but none postoperatively. He has had some SVT and does have some symptoms of palpitations but these are not bothersome and are currently being managed with metoprolol. We will continue current therapy. His updated medication list for this problem includes:    Metoprolol Succinate 100 Mg Xr24h-tab (Metoprolol succinate) .[name]... Take 1 & 1/2 tabs by mouth once daily    Aspirin 81 Mg Tabs (Aspirin) .[name]... Take 1 tablet by mouth once a day  Orders: EKG w/ Interpretation (93000)  Problem # 3:  HYPERLIPIDEMIA-MIXED (ICD-272.4) We recently switched him from Vytorin to Crestor because of insurance reasons. We will get a repeat lipid and liver profile in 2 weeks. His updated medication list for this problem includes:    Crestor 20 Mg Tabs (Rosuvastatin calcium) .[name]... Take one tablet by mouth daily.  Patient Instructions: 1)  Your physician recommends that you return for FASTING lab work in 2 weeks- around the week of 6/6: lipid/liver/cbc/bmet (272.2;427.31) 2)  Your physician wants you to follow-up in: 6 months.  You will receive a reminder letter in the mail two months in advance. If you don't receive a letter, please call our office to schedule the follow-up  appointment. 3)  Your physician recommends that you continue on your current medications as directed. Please refer to the Current Medication list given to you today.

## 2010-04-08 NOTE — Progress Notes (Signed)
Summary: rx refill   Phone Note Refill Request Call back at [phone] Message from:  Patient on March 12, 2010 10:59 AM  Refills Requested: Medication #1:  METOPROLOL SUCCINATE 100 MG XR24H-TAB take 1 & 1/2 tabs by mouth once daily  Medication #2:  CRESTOR 20 MG TABS Take one tablet by mouth daily. [facility] #  [id number] 90 days supply and 3 refill. call pt when its done.   Method Requested: Fax to Local Pharmacy Initial call taken by: Roe Coombs,  March 12, 2010 10:59 AM Caller: Patient Initial call taken by: Roe Coombs,  March 12, 2010 10:57 AM    Prescriptions: METOPROLOL SUCCINATE 100 MG XR24H-TAB (METOPROLOL SUCCINATE) take 1 & 1/2 tabs by mouth once daily  #135 x 3   Entered by:   Caralee Ates CMA   Authorized by:   [name], MD   Signed by:   Caralee Ates CMA on 03/12/2010   Method used:   Faxed to ...       [facility] (mail-order)             , [place]         Ph:        Fax: [phone]   RxID:   [id number] CRESTOR 20 MG TABS (ROSUVASTATIN CALCIUM) Take one tablet by mouth daily.  #90 x 3   Entered by:   Caralee Ates CMA   Authorized by:   [name], MD   Signed by:   Caralee Ates CMA on 03/12/2010   Method used:   Faxed to ...       [facility] (mail-order)             , [place]         Ph:        Fax: [phone]   RxID:   [id number]

## 2010-04-08 NOTE — Letter (Signed)
Summary: Letter  Letter   Imported By: Kassie Mends 05/13/2009 09:37:39  _____________________________________________________________________  External Attachment:    Type:   Image     Comment:   External Document

## 2010-04-08 NOTE — Assessment & Plan Note (Signed)
Summary: FU---STC   Vital Signs:  Patient profile:   70 year old male Height:      68 inches Weight:      197 pounds BMI:     30.06 O2 Sat:      96 % on Room air Temp:     98.1 degrees F oral Pulse rate:   75 / minute BP sitting:   120 / 82  (left arm) Cuff size:   large  Vitals Entered By: Ami Bullins CMA (January 05, 2010 1:13 PM)  O2 Flow:  Room air CC: yearly/ ab  Does patient need assistance? Ambulation Normal Comments flu shot today/ ab   Primary Care Provider:  Jacques Navy MD  CC:  yearly/ ab.  History of Present Illness: Patient presents for a wellness exam. In the interval since last visit has been doing well. He has no particular complaints. He does have a flare of hemorrhoids and would like a refill on pramazone.  Patinet is 100% independent in his ADLs. Manages all his own affiars including reitrement accounts, investments and is socially engaged on many fronts. patient has no signs or symptoms of depression. He has had no falls and has no fall risks.   Preventive Screening-Counseling & Management  Alcohol-Tobacco     Alcohol drinks/day: <1     Alcohol type: wine     >5/day in last 3 mos: no     Smoking Status: never  Caffeine-Diet-Exercise     Caffeine use/day: none     Diet Comments: heart healthy     Does Patient Exercise: yes     Type of exercise: cardio     Exercise (avg: min/session): 30-60     Times/week: 7  Hep-HIV-STD-Contraception     Hepatitis Risk: no risk noted     HIV Risk: no risk noted     STD Risk: no risk noted     Dental Visit-last 6 months yes     TSE monthly: no     Sun Exposure-Excessive: no  Safety-Violence-Falls     Seat Belt Use: yes     Helmet Use: n/a     Firearms in the Home: firearms in the home     Smoke Detectors: yes     Violence in the Home: no risk noted     Sexual Abuse: no     Fall Risk: low fall risk      Sexual History:  currently monogamous.        Drug Use:  never.        Blood  Transfusions:  no.    Current Medications (verified): 1)  Crestor 20 Mg Tabs (Rosuvastatin Calcium) .... Take One Tablet By Mouth Daily. 2)  Metoprolol Succinate 100 Mg Xr24h-Tab (Metoprolol Succinate) .... Take 1 & 1/2 Tabs By Mouth Once Daily 3)  Aspirin 81 Mg  Tabs (Aspirin) .... Take 1 Tablet By Mouth Once A Day 4)  Vitamin-B Complex   Tabs (B Complex Vitamins) .... Take 1 Tablet By Mouth Once A Day 5)  Coq- 10 300 Mg .... Take 1 Tablet By Mouth Once A Day 6)  Bilberry 100 Mg Caps (Bilberry (Vaccinium Myrtillus)) .... Take One Tab By Mouth Once Daily 7)  Saw Palmetto 160 Mg  Tabs (Saw Palmetto (Serenoa Repens)) .... Take One Tablet By Mouth Once A Day 8)  Multivitamins   Tabs (Multiple Vitamin) .... Take 1 Tablet By Mouth Once A Day 9)  Viagra 100 Mg Tabs (Sildenafil Citrate) .Marland KitchenMarland KitchenMarland Kitchen  1/2 To 1 Once Daily As Needed 10)  Fish Oil 1000 Mg Caps (Omega-3 Fatty Acids) .... Take 1 Capsule By Mouth Three Times A Day 11)  Vitamin B-6 100 Mg Tabs (Pyridoxine Hcl) .... Take 1 Tablet By Mouth Once A Day 12)  Pramosone 1-2.5 % Crea (Pramoxine-Hc) .... Apply Two Times A Day As Needed For Hemorrhoids.  Allergies (verified): 1)  ! 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  1. Atrial tachycardia and NSVT 05/2008 Consult [facility] 2.  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 3. History of atrial fibrillation prior to valvular heart surgery, now     off Coumadin. 4. Hyperlipidemia. 5. History of mild elevation of liver function tests. 6. History of remote pericarditis. 7. Status post lumbar disk surgery.   Physician Roster:               cardiology - Dr. Shirlee Latch               dermatology - Dr. Dorinda Hill               GI- Dr. Mirna Mires [facility] OD               NS - Dr. 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 Patient has never smoked.  Alcohol Use - yes -occ. End-of-life: discussed issues and provided packet with the MOST form and out of facility order.  Caffeine use/day:  none Dental Care w/in 6 mos.:  yes Sun Exposure-Excessive:  no Seat Belt Use:  yes Fall Risk:  low fall risk Hepatitis Risk:  no risk noted HIV Risk:  no risk noted STD Risk:  no risk noted Sexual History:  currently monogamous Drug Use:  never Blood Transfusions:  no  Review of Systems       The patient complains of fever, weight gain, and decreased hearing.  The patient denies anorexia, weight loss, vision loss, chest pain, syncope, dyspnea on exertion, peripheral edema, prolonged cough, hemoptysis, abdominal pain, hematochezia, severe indigestion/heartburn, incontinence, muscle weakness, suspicious skin lesions, difficulty walking, depression, abnormal bleeding,  angioedema, and testicular masses.         limited illness with rigors on trip to [place]. Cruise related weight gain - temporary.Mild prostatism  Physical Exam  General:  Well-developed,well-nourished,in no acute distress;  alert,appropriate and cooperative throughout examination Head:  Normocephalic and atraumatic without obvious abnormalities. No apparent alopecia or balding. Eyes:  No corneal or conjunctival inflammation noted. EOMI. Perrla. Funduscopic exam benign, without hemorrhages, exudates or papilledema. Vision grossly normal. Ears:  External ear exam shows no significant lesions or deformities.  Otoscopic examination reveals clear canals, tympanic membranes are intact bilaterally without bulging, retraction, inflammation or discharge. Hearing is grossly normal bilaterally. Nose:  no external deformity and no external erythema.   Mouth:  Oral mucosa and oropharynx without lesions or exudates.  Teeth in good repair. Neck:  supple, full ROM, no thyromegaly, and no carotid bruits.   Chest Wall:  No deformities, masses, tenderness or gynecomastia noted. Lungs:  Normal respiratory effort, chest expands symmetrically. Lungs are clear to auscultation, no crackles or wheezes. Heart:  Normal rate and regular rhythm. S1 and S2 normal without gallop, murmur, click, rub or other extra sounds. Abdomen:  soft, non-tender, normal bowel sounds, no guarding, no abdominal hernia, and no hepatomegaly.   Prostate:  deferred Msk:  normal ROM, no joint tenderness, no joint swelling, no joint warmth, no redness over joints, and no joint instability.   Pulses:  2+radial and DP pulses Extremities:  No clubbing, cyanosis, edema, or deformity noted with normal full range of motion of all joints.   Neurologic:  alert & oriented X3, cranial nerves II-XII intact, strength normal in all extremities, gait normal, and DTRs symmetrical and normal.   Skin:  turgor normal, color normal, no rashes, no suspicious lesions,  and no ulcerations.   Cervical Nodes:  no anterior cervical adenopathy and no posterior cervical adenopathy.   Inguinal Nodes:  no R inguinal adenopathy and no L inguinal adenopathy.   Psych:  Oriented X3, normally interactive, good eye contact, and not anxious appearing.     Impression & Recommendations:  Problem # 1:  ATRIAL FIBRILLATION (ICD-427.31) stable and appeared to be in NSR on my exam  Plan - continue present medications          follow-up with cardiology as instructed.  His updated medication list for this problem includes:    Metoprolol Succinate 100 Mg Xr24h-tab (Metoprolol succinate) .[name]... Take 1 & 1/2 tabs by mouth once daily    Aspirin 81 Mg Tabs (Aspirin) .[name]... Take 1 tablet by mouth once a day  Problem # 2:  HYPERLIPIDEMIA-MIXED (ICD-272.4) Stable on present medications with an LDL = 99, at goal for person without CAD/  Plan - continue current medications.  His updated medication list for this problem includes:    Crestor 20 Mg Tabs (Rosuvastatin calcium) .[name]... Take one tablet by mouth daily.  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 Good heart sounds with no click, no murmur.  Problem # 4:  COLONIC POLYPS, ADENOMATOUS, HX OF (ICD-V12.72) Current with follow-up colonoscopy - last study April '09.  Plan - follow-up per GI protocol  Problem # 5:  ERECTILE DYSFUNCTION (ICD-302.72) Patient reports good response with viagra without any complications.  His updated medication list for this problem includes:    Viagra 100 Mg Tabs (Sildenafil citrate) .[name]... 1/2 to 1 once daily as needed  Problem # 6:  Preventive Health Care (ICD-V70.0)  Unremarkable interval history. Physical exam is normal. Lab results are all within normal limits. Current as stated with colorectal cancer screening. PSA is normal. Immunizations: Tetnu Octobet '10, Pneumovax and shingles July '09, flu shot today.  Patient  who  is not depressed, with full cognitive function, 100% independent in ADLs with no fall risk. He is counseled to continue his current medications and exercise regimen. He is counseled to loose 10-15lbs through smart food choices, portion size control and continued exercise. He is counseled on the subtlties of advanced directives and is provided a packet that will serve to stimulate discussion and decision making. He will inform me of any decisions that he makes and can bring documents for signature as appropriate.  In summary - a very nice man who is medically stable at this time. He will return as needed or 1 year.   Orders: Medicare -1st Annual Wellness Visit [phone])  Complete Medication List: 1)  Crestor 20 Mg Tabs (Rosuvastatin calcium) .... Take one tablet by mouth daily. 2)  Metoprolol Succinate 100 Mg Xr24h-tab (Metoprolol succinate) .... Take 1 & 1/2 tabs by mouth once daily 3)  Aspirin 81 Mg Tabs (Aspirin) .... Take 1 tablet by mouth once a day 4)  Vitamin-b Complex Tabs (B complex vitamins) .... Take 1 tablet by mouth once a day 5)  Coq- 10 300 Mg  .... Take 1 tablet by mouth once a day 6)  Bilberry 100 Mg Caps (Bilberry (vaccinium myrtillus)) .... Take one tab by mouth once daily 7)  Saw Palmetto 160 Mg Tabs (Saw palmetto (serenoa repens)) .... Take one tablet by mouth once a day 8)  Multivitamins Tabs (Multiple vitamin) .... Take 1 tablet by mouth once a day 9)  Viagra 100 Mg Tabs (Sildenafil citrate) .... 1/2 to 1 once daily as needed 10)  Fish Oil 1000 Mg Caps (Omega-3 fatty acids) .... Take 1 capsule by mouth three times a day 11)  Vitamin B-6 100 Mg Tabs (Pyridoxine hcl) .... Take 1 tablet by mouth once a day 12)  Pramosone 1-2.5 % Crea (Pramoxine-hc) .... Apply two times a day as needed for hemorrhoids.  Other Orders: Flu Vaccine 42yrs + MEDICARE PATIENTS (U0454) Administration Flu vaccine - MCR (U9811)  Patient: [name] [name] Note: All result statuses are Final unless  otherwise noted.  Tests: (1) Hepatic/Liver Function Panel (HEPATIC)   Total Bilirubin           0.8 mg/dL                   9.1-4.7   Direct Bilirubin          0.2 mg/dL                   8.2-9.5   Alkaline Phosphatase      55 U/L                      39-117   AST                       33 U/L                      0-37   ALT                       34 U/L                      0-53   Total Protein             6.3 g/dL                    6.2-1.3  Albumin                   4.0 g/dL                    7.8-2.9  Tests: (2) BMP (METABOL)   Sodium                    143 mEq/L                   135-145   Potassium                 4.2 mEq/L                   3.5-5.1   Chloride                  109 mEq/L                   96-112   Carbon Dioxide            26 mEq/L                    19-32   Glucose              [H]  101 mg/dL                   56-21   BUN                       21 mg/dL                    3-08   Creatinine                0.8 mg/dL                   6.5-7.8   Calcium                   9.1 mg/dL                   4.6-96.2   GFR                       102.53 mL/min               >60  Tests: (3) Lipid Panel (LIPID)   Cholesterol               168 mg/dL                   9-528     ATP III Classification            Desirable:  < 200 mg/dL                    Borderline High:  200 - 239 mg/dL               High:  > = 240 mg/dL   Triglycerides             104.0 mg/dL                 4.1-324.4     Normal:  <150 mg/dL     Borderline High:  010 - 199 mg/dL   HDL  48.70 mg/dL                 >78.29   VLDL Cholesterol          20.8 mg/dL                  5.6-21.3   LDL Cholesterol           99 mg/dL                    0-86  CHO/HDL Ratio:  CHD Risk                             3                    Men          Women     1/2 Average Risk     3.4          3.3     Average Risk          5.0          4.4     2X Average Risk          9.6          7.1     3X Average Risk           15.0          11.0                           Tests: (4) CBC Platelet w/Diff (CBCD)   White Cell Count          7.6 K/uL                    4.5-10.5   Red Cell Count       [L]  4.20 Mil/uL                 4.22-5.81   Hemoglobin                13.7 g/dL                   57.8-46.9   Hematocrit                39.5 %                      39.0-52.0   MCV                       94.1 fl                     78.0-100.0   MCHC                      34.7 g/dL                   62.9-52.8   RDW                       13.3 %                      11.5-14.6   Platelet Count            225.0 K/uL  150.0-400.0   Neutrophil %              61.9 %                      43.0-77.0   Lymphocyte %              25.2 %                      12.0-46.0   Monocyte %                7.9 %                       3.0-12.0   Eosinophils%              4.1 %                       0.0-5.0   Basophils %               0.9 %                       0.0-3.0   Neutrophill Absolute      4.7 K/uL                    1.4-7.7   Lymphocyte Absolute       1.9 K/uL                    0.7-4.0   Monocyte Absolute         0.6 K/uL                    0.1-1.0  Eosinophils, Absolute                             0.3 K/uL                    0.0-0.7   Basophils Absolute        0.1 K/uL                    0.0-0.1Prescriptions: VIAGRA 100 MG TABS (SILDENAFIL CITRATE) 1/2 to 1 once daily as needed  #6 x 12   Entered and Authorized by:   Jacques Navy MD   Signed by:   Jacques Navy MD on 01/05/2010   Method used:   Electronically to        [facility]* (retail)       [address]       [place], [place]  [id number]       Ph: [phone]       Fax: [phone]   RxID:   [id number] PRAMOSONE 1-2.5 % CREA (PRAMOXINE-HC) apply two times a day as needed for hemorrhoids.  #28.4g x 3   Entered and Authorized by:   Jacques Navy MD   Signed by:   Jacques Navy MD on 01/05/2010   Method used:    Electronically to        [facility]* (retail)       [address]       [place], [place]  [id number]       Ph: [phone]       Fax: [phone]   RxID:   [id number]  Orders Added: 1)  Flu Vaccine 38yrs + MEDICARE PATIENTS [Q2039] 2)  Administration Flu vaccine - MCR [G0008] 3)  Medicare -1st Annual Wellness Visit [G0438]  Flu Vaccine Consent Questions     Do you have a history of severe allergic reactions to this vaccine? no    Any prior history of allergic reactions to egg and/or gelatin? no    Do you have a sensitivity to the preservative Thimersol? no    Do you have a past history of Guillan-Barre Syndrome? no    Do you currently have an acute febrile illness? no    Have you ever had a severe reaction to latex? no    Vaccine information given and explained to patient? yes    Are you currently pregnant? no    Lot Number:[id number]   Exp Date:09/04/2010   Site Given  Left Deltoid IMt-CCC]          .[email or web address]

## 2010-04-08 NOTE — Assessment & Plan Note (Signed)
Summary: rov/appt time is 2:30pm/hm  Medications Added METOPROLOL SUCCINATE 100 MG XR24H-TAB (METOPROLOL SUCCINATE) take 1 & 1/2 tabs by mouth once daily FISH OIL 1000 MG CAPS (OMEGA-3 FATTY ACIDS) Take 1 capsule by mouth three times a day VITAMIN B-6 100 MG TABS (PYRIDOXINE HCL) Take 1 tablet by mouth once a day        Visit Type:  Follow-up Primary Provider:  Jacques Navy MD  CC:  Slow heart beat- Fatigue.  History of Present Illness: The patient is 70 years old and came in for an unscheduled visit today because of bradycardia and fatigue. He has valvular heart disease and underwent aortic and mitral valve replacement with tissue valves and he also had a Maze procedure at [facility] in July of 2006. Headache or fibrillation prior bowel surgery but none since. He did develop an episode of atrial tachycardia and was seen in consultation by Dr. Graciela Husbands. We increased his beta blocker slightly.  Over the past 5 days he has noted that his resting heart rate is slower in the range of 60-62. He also has noted symptoms of fatigue. This is not necessarily with exertion but he feels more fatigued even at rest. His exercise tolerance on the treadmill has not really changed. He has had occasional episodes of tachycardia palpitations which are short-lived but these have not changed over the months.  His other problems include hyperlipidemia and history of pericarditis. He also has a history of mildly elevated of her function tests.  Current Medications (verified): 1)  Vytorin 10-20 Mg  Tabs (Ezetimibe-Simvastatin) .... Take 1 Tablet By Mouth Once A Day 2)  Toprol Xl 200 Mg Xr24h-Tab (Metoprolol Succinate) .... Once Daily 3)  Aspirin 81 Mg  Tabs (Aspirin) .... Take 1 Tablet By Mouth Once A Day 4)  Vitamin-B Complex   Tabs (B Complex Vitamins) .... Take 1 Tablet By Mouth Once A Day 5)  Coq-10 200 Mg  Caps (Coenzyme Q10) .... Take 1 Tablet By Mouth Once A Day 6)  Bilberry 60 Mg  Caps  (Bilberry (Vaccinium Myrtillus)) .... Take 1 Tablet By Mouth Once A Day 7)  Saw Palmetto 160 Mg  Tabs (Saw Palmetto (Serenoa Repens)) .... Take 2 Tablet By Mouth Once A Day 8)  Multivitamins   Tabs (Multiple Vitamin) .... Take 1 Tablet By Mouth Once A Day 9)  Viagra 100 Mg Tabs (Sildenafil Citrate) .... 1/2 To 1 Once Daily As Needed 10)  Fish Oil 1000 Mg Caps (Omega-3 Fatty Acids) .... Take 1 Capsule By Mouth Three Times A Day 11)  Vitamin B-6 100 Mg Tabs (Pyridoxine Hcl) .... Take 1 Tablet By Mouth Once A Day  Allergies: 1)  ! Naprosyn  Past History:  Past Medical History: Reviewed history from 07/07/2008 and no changes required. 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  1. Atrial tachycardia and NSVT 05/2008 Consult [facility] 2. 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 3. History of atrial fibrillation prior to valvular heart surgery, now     off Coumadin. 4. Hyperlipidemia. 5.  History of mild elevation of liver function tests. 6. History of remote pericarditis. 7. Status post lumbar disk surgery.   Review of Systems       ROS is negative except as outlined in HPI.   Vital Signs:  Patient profile:   70 year old male Height:      68 inches Weight:      198.50 pounds BMI:     30.29 Pulse rate:   64 / minute Pulse rhythm:   irregular Resp:     18 per minute BP sitting:   128 / 80  (left arm) Cuff size:   large  Vitals Entered By: Vikki Ports (April 10, 2009 2:41 PM)  Physical Exam  Additional Exam:  Gen. Well-nourished, in no distress   Neck: JVP just at the clavicle, thyroid not enlarged, no carotid bruits Lungs: No tachypnea, clear without rales, rhonchi or wheezes Cardiovascular: Rhythm regular, PMI not displaced,  heart sounds  normal, no murmurs or gallops, no peripheral edema, pulses normal in all 4 extremities. Abdomen: BS normal, abdomen soft and non-tender without masses or organomegaly, no hepatosplenomegaly. MS: No deformities, no cyanosis or clubbing   Neuro:  No focal sns   Skin:  no lesions    Impression & Recommendations:  Problem # 1:  FATIGUE / MALAISE (ICD-780.79) He has developed more fatigue over the past 5 days associated with more bradycardia than usual. The symptoms do not appear to be related to exertion. His echocardiogram shows sinus rhythm with a rate of 64. The echocardiogram was otherwise normal.  I think the symptoms most probably are related to his beta blocker. We will cut back his metoprolol from 200 to 150 a day.  If he has persistent symptoms of fatigue and no worsening of tachycardia palpitations we may cut him back further to 100 mg per day.  We will get a CBC also to evaluate his symptoms of fatigue.  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 His heart valves sound normal and I believe his problem is stable.  Problem # 3:  ATRIAL FIBRILLATION (ICD-427.31) He has had no recurrence of a defibrillation that we know of since his Maze procedure in 2006. His updated medication list for this problem includes:    Metoprolol Succinate 100 Mg Xr24h-tab (Metoprolol succinate) .[name]... Take 1 & 1/2 tabs by mouth once daily    Aspirin 81 Mg Tabs (Aspirin) .[name]... Take 1 tablet by mouth once a day  Orders: EKG w/ Interpretation (93000) T-CBC w/Diff ([id number])  His updated medication list for this problem includes:    Metoprolol  Succinate 100 Mg Xr24h-tab (Metoprolol succinate) .[name]... Take 1 & 1/2 tabs by mouth once daily    Aspirin 81 Mg Tabs (Aspirin) .[name]... Take 1 tablet by mouth once a day  Patient Instructions: 1)  Your physician recommends that you schedule a follow-up appointment in: May 2011. 2)  Your physician recommends that you have lab work today: CBC (780.79;427.31) 3)  Your physician has recommended you make the following change in your medication: 1) decrease toprol to 150mg  once daily  Prescriptions: VYTORIN 10-20 MG  TABS (EZETIMIBE-SIMVASTATIN) Take 1 tablet by mouth once a day  #90 x 3   Entered by:   Sherri Rad, RN, BSN   Authorized by:   Lenoria Farrier, MD, [facility]   Signed by:   Sherri Rad, RN, BSN on 04/10/2009   Method used:  Electronically to        [facility]* (mail-order)             ,          Ph: [phone]       Fax: [phone]   RxID:   [id number] METOPROLOL SUCCINATE 100 MG XR24H-TAB (METOPROLOL SUCCINATE) take 1 & 1/2 tabs by mouth once daily  #135 x 3   Entered by:   Sherri Rad, RN, BSN   Authorized by:   Lenoria Farrier, MD, [facility]   Signed by:   Sherri Rad, RN, BSN on 04/10/2009   Method used:   Electronically to        [facility]* (mail-order)             ,          Ph: [phone]       Fax: [phone]   RxID:   [id number]

## 2010-07-20 NOTE — Assessment & Plan Note (Signed)
[facility]                            CARDIOLOGY OFFICE NOTE   NAME:[name] [name], [name] [name]                          MRN:          [id number]  DATE:01/09/2008                            DOB:          12/30/1940    This is a 70 year old white male patient of Dr. 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  He also has a history of paroxysmal atrial  fibrillation, but has not had problems since his maze procedure.   The patient now presents with several week history of recurrent  palpitations.  He says it occurs in the evening when he is sitting down.  Last night it started at 9:30 and lasted until midnight.  He describes  it as racing heart between 105 and 120 for several minutes and then it  dropped to the 60s for a few minutes and then went back up again.  This  happened off and on for a couple hours.  It does not happen every  evening, but about every other day.  He exercises daily, riding an  exercise bike or a treadmill for 30-45 minutes and has no symptoms  during exercise.  He has an occasional skip during the day, but nothing  sustained like he has in the evening.  He has not done anything  differently over the past several weeks other than help his son move.  He denies any caffeine intake.  He drinks a glass a wine every evening  which is not unusual.  He has not taken any supplements other than  listed in his medications and has never had any thyroid trouble that he  knows of.  He says he does not feel like this is atrial fibrillation  because it is a little different than in the past.   CURRENT MEDICATIONS:  1. Toprol 175 mg daily.  2. Vytorin 10/20 mg daily.  3. Altace 5 mg daily.  4. Multivitamin daily.  5. Fish oil 2000 mg daily.  6. Vitamin B daily.  7. CoQ10 daily.  8. Selenium vitamin daily.  9.  Bilberry vitamin daily.  10.Aspirin 81 mg daily.  11.Saw palmetto 320 mg daily.   PHYSICAL EXAMINATION:  GENERAL:  This is a pleasant 70 year old white  male in no acute distress.  VITAL SIGNS:  Blood pressure 132/72, pulse 68, weight 201.  NECK:  Without JVD, HJR, bruit, or thyroid enlargement.  LUNGS:  Clear anterior, posterior and lateral.  HEART:  Regular rate and rhythm at 66 beats per minute.  Normal S1 and  S2.  No murmur, rub, bruit, thrill, or heave noted.  ABDOMEN:  Soft without organomegaly, masses, lesions, or abnormal  tenderness.  EXTREMITIES:  Without cyanosis, clubbing, or edema.  He has good distal  pulses.   EKG normal sinus rhythm with PVC, no acute change.   IMPRESSION:  1. Tachy palpitations.  2. History paroxysmal atrial fibrillation status post maze procedure  in July 2006.  3. Valvular heart disease status post aortic and mitral valve      replacement with tissue valve in July 2006 at [facility].  4. History of normal left ventricular function.  5. Hyperlipidemia.  6. History of mildly elevated liver function tests.  7. Remote history of pericarditis.  8. Status post lumbar disk surgery.   PLAN:  We will check a BMET and TSH today.  We will place an event  recorder on him.  We will increase his Toprol to 200 mg daily.  He had  an echo in January, which looked good and we do not feel like he needs  repeat at this time.  Dr. Juanda Chance will see him back in 2-3 weeks for  followup.      Jacolyn Reedy, PA-C  Electronically Signed      Everardo Beals. Juanda Chance, MD, [facility]  Electronically Signed   ML/MedQ  DD: 01/09/2008  DT: 01/10/2008  Job #: [id number]

## 2010-07-20 NOTE — Assessment & Plan Note (Signed)
[facility]                         GASTROENTEROLOGY OFFICE NOTE   [name] [name], [name] [name]                          MRN:          [id number]  DATE:05/29/2007                            DOB:          [birth year]    [name] [name] is an extremely pleasant 70 year old white male with valvular  heart disease who had placement of two tissue valves at the [facility] in July, 2006 along with a Maze procedure because of recurrent  atrial fibrillation.  He has done well since that time and is off  Coumadin.  He is really having no cardiovascular symptoms whatsoever.   He has a long history of recurrent polyps with the removal of rather  prominent tubulovillous adenoma in 2000 but had a negative follow-up  colonoscopy five years ago.  He is now due for follow-up colonoscopy  exam.  He is having regular bowel movements without melena,  hematochezia, or any abdominal pain, upper GI, or hepatobiliary  problems.  His appetite is good, and his weight is stable.  He denies  any specific food intolerances.   Besides his valvular heart disease, the patient is in good health.  In  the past, he has had some borderline liver function tests related to  statin therapy.   He currently is taking Toprol 75 mg a day, Vytorin 10/20 mg a day,  Altace 5 mg a day, fish oil 1000 mg a day, vitamin B and coenzyme Q10  daily, and aspirin 81 mg a day.   FAMILY HISTORY:  Noncontributory, without any known colon polyps or  colon carcinoma.   SOCIAL HISTORY:  He is married and lives with his wife and currently  practicing administrative work.  He is a former Clinical research associate but retired.  He  does not smoke and uses ethanol socially.   REVIEW OF SYSTEMS:  Unremarkable without current cardiovascular or  pulmonary complaints.   PHYSICAL EXAMINATION:  He is a healthy-appearing white male appearing  younger than stated age in no acute distress.  He is 5 feet 8 and weighs 200 pounds.  Blood pressure  126/82, pulse 72  and regular.  There was no stigmata of chronic liver disease noted.  CHEST:  Entirely clear.  Regular rhythm without murmurs, rubs or gallops.  ABDOMEN:  No organomegaly, masses, or tenderness.  Bowel sounds were  normal.  Peripheral extremities showed no edema, phlebitis, or swollen joints.  MENTAL STATUS:  Clear.   ASSESSMENT:  1. Patient is scheduled for a follow-up colonoscopy for his history of      adenomatous colon polyps and seems to be well compensated      cardiovascular-wise at this time and is not on anticoagulants.  2. Bivalvular heart disease surgery because of his aortic and mitral      valves.  3. Status post Maze procedure for atrial fibrillation.  4. History of hyperlipidemia.  5. History of previous lumbosacral disk surgery.   RECOMMENDATIONS:  1. Proceed with colonoscopy, on salicylate therapy.  2. Close monitoring during his colonoscopy procedure.  3. We will ask Dr. Juanda Chance to recommend whether or not  he needs      preoperative antibiotics.  We will follow his recommendations. In      the GI literature, this issue is unclear.  4. Continue all other medications listed above.     Vania Rea. Jarold Motto, MD, [facility], FAGA  Electronically Signed    DRP/MedQ  DD: 05/29/2007  DT: 05/29/2007  Job #: [id number]   cc:   Everardo Beals. Juanda Chance, MD, [facility]  Rosalyn Gess. Norins, MD

## 2010-07-20 NOTE — Assessment & Plan Note (Signed)
[facility]                            CARDIOLOGY OFFICE NOTE   NAME:[name], [name]                          MRN:          [id number]  DATE:01/09/2008                            DOB:          1940/08/02    ADDENDUM:  The patient's Toprol; he was on 175 mg daily, and we are  increasing it to 200 mg daily.      Jacolyn Reedy, PA-C       Bruce R. Juanda Chance, MD, [facility]    ML/MedQ  DD: 01/09/2008  DT: 01/10/2008  Job #: [id number]

## 2010-07-20 NOTE — Letter (Signed)
May 05, 2008    [name] R. Juanda Chance, MD, [facility]  [address]  [place], [place] [address]   RE:  [name] [name], [name] [name]  MRN:  [id number]  /  DOB:  01-14-1941   Dear Smitty Cords:   It was a pleasure to see [name] [name] today at your request.  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  He had mitral and aortic valve  involvement and was followed very closely by you and in 2004 developed  atrial fibrillation requiring cardioversion.  This happened once here in  the [place] and once also in [place].  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   Since that time, he has done really very well.  There has been  occasional palpitations but nothing of note until the last month or two  where he has began to develop tachycardia, notable mostly in the  evenings.  He would take heart rate to be 125 beats per minute or so.  These episodes would initially last a few minutes than longer and now it  has been lasting up to 3-4 hours.  There were initially quite infrequent  and is becoming increasingly frequent occurring almost every day until  this last week where they have gradually abated again.  They are  associated with an awareness of the discomfort, but there is no specific  symptoms of lightheadedness, chest pain, or shortness of breath with  them.  He does not note them with exercise.   He is relatively fit.  He is able to exercise an hour or two a day.  He  walks on his treadmill 2-3 miles per hour achieving a peak heart rate of  100 beats per minute.  I should note that with  the first complaint of  this his Toprol, which is already relatively generous, it was further up  titrated up to 200 mg a day.   His related history is notable for pericarditis in the 1990s.   His noncardiac history is broadly negative apart from issues of back  pain for which he underwent laminectomy in 1978.   His review of systems across multiple organ systems is notable only for  his eyeglasses.   Social history is noted primarily as above.  He does not use cigarettes  or recreational drugs.  He drinks alcohol once or twice a day, but he is  not for the last few weeks since he embarked on a [employer].   His medications include:  1. Toprol 200 a day.  2. Fish oil.  3. Aspirin 81.  4. Altace 5.  5. Vytorin 10/20.  6. A variety of nutraceuticals.   On examination, he is an elderly Caucasian male appearing his stated age  of 30.  His blood pressure was 100/74.  His pulse was 72.  His weight  was 196, which represents a 6-pound weight loss over the last 3 months.  His HEENT exam demonstrate no icterus or no xanthoma.  The neck veins  were flat.  The carotids were brisk and full bilaterally without  bruits.  Back was without kyphosis or scoliosis.  Lungs were clear.  Heart sounds  were regular with a 2/6 systolic murmur heard at the right upper sternal  border.  The abdomen was soft with active bowel sounds without midline  pulsation or hepatomegaly.  Femoral pulses were 2+ and distal pulses  were intact.  There is no clubbing, cyanosis, or edema.  The  neurological exam was grossly normal.  The skin was warm and dry.   The heart recording that you obtained was notable for a number of  episodes of atrial tachycardia.  There appeared to be no VA linking,  which would suggest the absence of a 2:1 conduction pattern.  On October 08, 2007, there was also a run-off of 4 beats of wide-complex  tachycardia, which likely represents a nonsustained ventricular  tachycardia;  although, it has the appearance of aberration in channel 3,  which most likely represents lead V1.  Against aberration is the fact  that the preceding RR interval on the first beat, which was somewhat  diffuse and is indeed longer than the preceding RR interval.   His electrocardiogram from January 09, 2008, demonstrates sinus rhythm  at 66 with interval 0.20/0.10/0.38 with an axis of 38 degrees with an  occasional premature beat, which likely represents a PVC.   IMPRESSION:  1. Atrial tachycardia.  2. Prior aortic valve replacement/mitral valve replacement/CryoCath      with left atrial appendage closure from the inside.  3. 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  4. No history of pericarditis.   [name], Mr. Damon has what looks to be an atrial tachycardia in the  context of his prior complex valve replacement surgery and CryoCath  maze.  I would infer the mechanism that it is unlikely related to his  maze procedure, and more likely represents a singular focus.  I cannot  tell from the tracings whether it is the right atrial or a left atrial  focus.  Though with my observation with Channel 3, it is probably  related to the left atrium as it is positive, and what I am referring is  lead V1.  The wide-complex tachycardia is probably nonsustained VT,  although it could be aberration related to accelerated firing.  We see  it twice on page 3, once as couplet and once as nonsustained.  In the  event that it is ventricular tachycardia, its prognostic implications  are slight minimal in the setting of normal left ventricular function;  it does bother me some, however, that it is occurring in the context of  rapid rate.  This observation makes me think that it is more likely to  be more atrial arrhythmia with aberration; though as opposed to  tachycardia, which would be the other explanation.   As relates to anticoagulation, two observations I think  are important.  One is, at the atrial cycle length of this tachycardia and, if in fact  ,it is atrial tachycardia as suggested by the lack of VA linking, then  anticoagulation I do not think is necessary.  Furthermore, Dr. Velia Meyer  sewed the left atrial appendage close at the time of his surgery.  I  will try and contact Alwyn Ren, who is the head EP at [facility] and  ask him what the recurrence has been with left atrial thrombi in  patients whom Dr. Velia Meyer has closed this way.  I suspect that it is  exceedingly low, which  would also make the issue of thromboembolism  probably moot   As relates to the treatment for his tachycardia, then it comes down to  the symptoms.  At this point, he is not particularly bothered by it.  I  did give him prescription for verapamil 120 to take and the event that  his symptoms recurred.  Given the propensity towards his bradycardia,  aggravating his beta-blocker, I told him that if he were do that then  and he should decrease his Toprol to 150 mg a day.   Thanks very much for the consultation.    Sincerely,     Duke Salvia, MD, [facility]  Electronically Signed   SCK/MedQ  DD: 05/05/2008  DT: 05/06/2008  Job #: [id number]

## 2010-07-20 NOTE — Assessment & Plan Note (Signed)
[facility]                            CARDIOLOGY OFFICE NOTE   NAME:[name], [name]                          MRN:          [id number]  DATE:03/26/2007                            DOB:          Jul 19, 1940    PRIMARY CARE PHYSICIAN:  Rosalyn Gess. Norins, MD   CLINICAL HISTORY:  [name] [name] is 70 years old and returned for  management of his valvular heart disease.  He had rheumatic heart  disease and aortic and mitral valve regurgitation and underwent  placement of two tissue valves at [facility] in July 2006.  He has  done extremely well since that time and his heart size has returned to  normal and he has been doing well without any symptoms of chest pain,  shortness of breath or palpitations.   He also has a history of previous atrial fibrillation and he had a MAZE  procedure done at the time of this surgery.  He had no recurrence since  his surgery and we took him off Coumadin after 6 months following  surgery.   PAST MEDICAL HISTORY:  1. Significant for hyperlipidemia.  2. Remote pericarditis.  3. 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  4. He has had borderline elevated liver function tests.   CURRENT MEDICATIONS:  1. Toprol.  2. Altace.  3. Multivitamins.  4. Fish oil.  5. Aspirin.  6. Vytorin.   PHYSICAL EXAMINATION:  Today, blood pressure 113/72, pulse 74 and  regular.  There was no venous tension.  The carotid pulses were full  without bruits.  CHEST:  Was clear.  CARDIAC:  Rhythm is regular.  There were normal sounds of his prosthetic  valve.  There is a grade 2/6 systolic murmur  at the left sternal edge.  ABDOMEN: Was soft with normal bowel sounds.  There is no  hepatosplenomegaly.  Peripheral pulses were full and there was no peripheral edema.   Recent echocardiogram showed a normal LV function and normal prosthetic  valve function.  His left ventricular dimensions had decreased to normal  size.   IMPRESSION:  1. Valvular heart disease status post aortic and mitral valve      replacement tissue valves and a MAZE procedure July 2006 at      [name], now stable.  2. History of prior paroxysmal atrial fibrillation.  3. Hyperlipidemia.  4. Mild elevation of liver function tests.  5. History of pericarditis.  6. Status post lumbar disk surgery.   RECOMMENDATIONS:  I think [name] [name] is doing quite well.  We did not make  any change in his medications.  We talked about Altace and both felt  that keeping him on long-term was best strategy.  I will plan to see him  back in 6 months.  His HDL was somewhat low and he will work on  restricting the bad carbs and losing some weight.   ADDENDUM:  [name] [name] still does have some symptoms of fatigue related to  his bi-valvular heart disease and I think he should continue the same  restrictions as he  has had up to this point.  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     Bruce Elvera Lennox Juanda Chance, MD, [facility]  Electronically Signed    BRB/MedQ  DD: 03/26/2007  DT: 03/26/2007  Job #: [id number]   cc:   Rosalyn Gess. Norins, MD

## 2010-07-20 NOTE — Assessment & Plan Note (Signed)
[facility]                            CARDIOLOGY OFFICE NOTE   NAME:[name], [name]                          MRN:          [id number]  DATE:03/26/2007                            DOB:          03/14/1940    ADDENDUM:  [name] still does have some symptoms of fatigue related to  his bi-valvular heart disease and I think he should continue the same  restrictions as he has had up to this point.  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     Bruce Elvera Lennox Juanda Chance, MD, [facility]     BRB/MedQ  DD: 03/26/2007  DT: 03/26/2007  Job #: [id number]

## 2010-07-20 NOTE — Assessment & Plan Note (Signed)
[facility]                            CARDIOLOGY OFFICE NOTE   NAME:[name] [name], [name] [name]                          MRN:          [id number]  DATE:01/28/2008                            DOB:          15-Aug-1940    PRIMARY CARE PHYSICIAN:  Rosalyn Gess. Norins, MD   CLINICAL HISTORY:  [name] [name] returned for a followup visit for  evaluation and management of palpitations.  Three years ago, he had  aortic and mitral valve replacements with tissue valves by Dr. Sandria Manly in  the [facility].  He also had a maze procedure at that time.  He  had had atrial fibrillation prior to that.  He has done extremely well  since that time.  I saw him recently with symptoms of palpitations.  He  describes his heart as beating up and a heart rate about 110, which he  could monitor on a Watch heart rate monitor.  He also could feel his  heartbeat in his upper chest.  These would last about a minute and he  may have this 4-5 times a week.  We got a TSH and BMP, which were normal  and we put him on an event monitor.  So far, we have not detected any of  these events on the event monitor, but he was not clear about triggering  the monitor and the monitor does not auto trigger and so this may go up  to 130.  I talked to Windell Moulding today and she is going to scan the monitor in  the time period, where he thinks he may have had the arrhythmia such as  9-12 in the evening during the first couple weeks before we increased  his Toprol.  We did increase his Toprol from 175-200 day.   PAST MEDICAL HISTORY:  Significant for remote pericarditis and  hyperlipidemia.   CURRENT MEDICATIONS:  1. Vytorin 10/20.  2. Altace.  3. Aspirin.  4. Toprol 200 mg daily.   PHYSICAL EXAMINATION:  VITAL SIGNS:  The blood pressure was 116/76 and  pulse 70 and regular.  NECK:  There was no venous distension.  The carotid pulses were full  without bruits.  CHEST:  Clear.  HEART:  Rhythm was regular with a sharp  2/6 systolic murmur at the left  sternal edge.  ABDOMEN:  Soft.  Normal bowel sounds.  There was no hepatosplenomegaly.  EXTREMITIES:  Peripheral pulses were full with no peripheral edema.   I reviewed the monitor, I could only find a few isolated PVCs.   IMPRESSION:  1. Palpitations of uncertain etiology.  2. Valvular heart disease status post aortic and mitral valve      replacements with tissue valve and a maze procedure to [facility] in July 2006.  3. History of atrial fibrillation prior to valvular heart surgery, now      off Coumadin.  4. Hyperlipidemia.  5. History of mild elevation of liver function tests.  6. History of remote pericarditis.  7. Status post lumbar disk surgery.   RECOMMENDATIONS:  Unfortunately, we  are unable capture any arrhythmia so  far on the event monitor.  I will try and scan this and see if we can  document some rates in the 110s to see if we can identify what this is.  This may have been atrial fibrillation and may have changed because of  the maze procedure.  It sounds like his atrial fib risk burden will be  quite low and whether to put him back on Coumadin may be a difficult  decision.  We will make some discussion when we identify the arrhythmia.     Bruce Elvera Lennox Juanda Chance, MD, [facility]  Electronically Signed    BRB/MedQ  DD: 01/28/2008  DT: 01/29/2008  Job #: [id number]

## 2010-07-20 NOTE — Assessment & Plan Note (Signed)
[facility]                            CARDIOLOGY OFFICE NOTE   NAME:[name], [name]                          MRN:          [id number]  DATE:09/25/2007                            DOB:          01-30-1941    PRIMARY CARE PHYSICIAN:  Rosalyn Gess. Norins, MD   CLINICAL HISTORY:  [name] [name] returned for followup management of his  valvular heart disease.  Three years ago, he had aortic and mitral valve  replacements with tissue valve and a maze procedure performed by Dr.  Velia Meyer at the [facility].  He has done extremely well since that  time.  He is exercising regularly, and he has had no symptoms of chest  pain, shortness of breath, or palpitations.  As far he can tell, he has  not had any recurrence of his atrial fibrillation since his valve  surgery.   PAST MEDICAL HISTORY:  Significant for remote history of pericarditis.  There is a history of paroxysmal atrial fibrillation.  He also has  hyperlipidemia.   CURRENT MEDICATIONS:  1. Toprol 175 mg daily.  2. Vytorin 10/20 daily.  3. Altace 5 mg daily.  4. Aspirin.   PHYSICAL EXAMINATION:  VITAL SIGNS:  Blood pressure is 117/77 and pulse  76 and regular.  NECK:  There is no venous distension.  The carotid pulses were full  without bruits.  CHEST:  Clear.  CARDIAC:  Rhythm was regular.  I could hear no murmurs or gallops.  ABDOMEN:  Soft with normal bowel sounds.  EXTREMITIES:  Peripheral pulses were full with no peripheral edema.   Electrocardiogram showed sinus rhythm and was normal.   IMPRESSION:  1. Valvular heart disease status post aortic and mitral valve      replacements with tissue valves and a maze procedure, July 2006 at      [facility], stable  2. Good left ventricular function.  3. History of paroxysmal atrial fibrillation, now off Coumadin.  4. Hyperlipidemia.  5. History of mild elevation of liver function tests.  6. Remote history of pericarditis.  7. Status post  lumbar disk surgery.   RECOMMENDATIONS:  I think, [name] [name] is doing very well.  We did not make  any changes in his medications.  We will plan to recheck his valve  function and LV function prior to his visit in a  year.  I will see him back in 1 year.  There is no contraindication to  Viagra.  He is not taking nitroglycerin.     Bruce Elvera Lennox Juanda Chance, MD, [facility]  Electronically Signed    BRB/MedQ  DD: 09/25/2007  DT: 09/26/2007  Job #: [id number]

## 2010-07-20 NOTE — Assessment & Plan Note (Signed)
[facility]                            CARDIOLOGY OFFICE NOTE   NAME:[name], [name]                          MRN:          [id number]  DATE:09/07/2006                            DOB:          [birth year]    PRIMARY CARE PHYSICIAN:  Dr. 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 He also had a maze procedure.  Postoperatively he had some  left ventricular dysfunction but his last echogram in January of this  year showed an ejection fraction of 65% to 70% with good valve function.   He says he has been doing reasonably well.  He does have occasional  palpitations and he still has some symptoms of fatigue.  He has had no  atrial fibrillation that he has been able to recognize from his  symptoms.   PAST MEDICAL HISTORY:  Significant for hyperlipidemia.  History of  remote pericarditis.  He also had a perioperative ulnar nerve injury  following his surgery which resolved. He has also had borderline  elevated liver function tests on his statin therapy.   CURRENT MEDICATIONS:  Include:  1. Toprol 175 mg daily.  2. Vytorin.  3. Altace 5 mg daily.  4. Vitamins.  5. Aspirin.   EXAMINATION:  The blood pressure was 119/79, pulse 73 and regular.  There was no venous distension.  The carotid pulses were full without bruit.  CHEST:  Clear without rales or rhonchi.  CARDIAC:  Rhythm was regular.  There was normal bioprosthetic closure  sounds and there was a short 2/6 systolic ejection murmur  at left  sternal edge.  ABDOMEN:  Soft with normal bowel sounds.  There is no  hepatosplenomegaly.  Peripheral pulses were full and there was no peripheral edema.   An electrocardiogram showed sinus rhythm and was normal.   IMPRESSION:  1. 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  History of paroxysmal atrial      fibrillation, hyperlipidemia, history of remote pericarditis.      Ejection fraction 65% to 70%.  2. Status post lumbar disc surgery January 2007.  3. Mild elevation of liver function tests.   RECOMMENDATIONS:  I think [name] [name]'s cardiac condition is stable.  He still  has symptoms of fatigue and palpitations and with his bi-valvular heart  disease I think he still has the same restrictions as before.  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  I will plan to have him come back for a BNP.  We will  repeat his echocardiogram in 6 months followed by a visit at that time.     Bruce Elvera Lennox Juanda Chance, MD, [facility]  Electronically Signed    BRB/MedQ  DD: 09/07/2006  DT: 09/08/2006  Job #: [id number]

## 2010-07-23 NOTE — Op Note (Signed)
NAME:  [name] [name], [name] [name] NO.:  [id number]   MEDICAL RECORD NO.:  [id number]          PATIENT TYPE:  OIB   LOCATION:  3172                         FACILITY:  [facility]   PHYSICIAN:  Payton Doughty, M.D.      DATE OF BIRTH:  06/24/1940   DATE OF PROCEDURE:  03/09/2005  DATE OF DISCHARGE:                                 OPERATIVE REPORT   PREOPERATIVE DIAGNOSIS:  Herniated disk, L4-5, on the left.   POSTOPERATIVE DIAGNOSIS:  Herniated disk, L4-5, on the left.   OPERATIVE PROCEDURE:  Left L4-5 laminectomy and diskectomy.   SURGEON:  Payton Doughty, M.D.   NURSE ASSISTANT:  [facility].   DOCTOR ASSISTANT:  Clydene Fake, M.D.   ANESTHESIA:  General endotracheal.   PREPARATION:  Sterile Betadine prep and scrub with alcohol wipe.   COMPLICATIONS:  None.   BODY OF TEXT:  A 70 year old gentleman with a herniated disk at L4-5 on the  left.  Taken to the operating room and smoothly anesthetized and intubated,  placed prone on the operating table.  Following shave, prep and drape in the  usual sterile fashion, the skin was infiltrated with 1% lidocaine and  1:400,000 epinephrine.  A skin incision was made from mid-L4 to the top of  L5 and the lamina of L4 was exposed on the left side in the periosteal  plane.  Intraoperative x-ray confirmed correctness of the level.  Having  confirmed correctness of the level, hemisemilaminectomy was done using the  high-speed drill and the Kerrison.  It was carried out to the top of the  ligamentum flavum and it was removed in a retrograde fashion laterally to  the lateral margin of the thecal sac.  The thecal sac was retracted medially  and immediately obvious beneath it was a free fragment of disk.  The annular  fibers were divided over top of it and the fragment was grasped and removed  without difficulty.  The disk space was explored and all graspable fragments  removed.  The wound was irrigated and hemostasis assured, the nerve root  explored in all quadrants.  The laminectomy defect was filled with Depo-  Medrol-soaked.  The fascia and subcutaneous tissues were reapproximated with  2-0 Vicryl in interrupted fashion, the subcuticular tissue was  reapproximated with 3-0 Vicryl in interrupted fashion.  The skin was closed  with 4-0 Vicryl in a running subcuticular fashion.  Benzoin and Steri-Strips  were placed and made occlusive with Telfa and OpSite, and the patient  returned to the recovery room in good condition.          ______________________________  Payton Doughty, M.D.    MWR/MEDQ  D:  03/09/2005  T:  03/09/2005  Job:  [id number]

## 2010-07-23 NOTE — Discharge Summary (Signed)
NAME:  [name] [name], [name] [name]                           ACCOUNT NO.:  [id number]   MEDICAL RECORD NO.:  [id number]                   PATIENT TYPE:  INP   LOCATION:  2039                                 FACILITY:  [facility]   PHYSICIAN:  Charlton Haws, M.D.                  DATE OF BIRTH:  1940/12/05   DATE OF ADMISSION:  07/14/2002  DATE OF DISCHARGE:  07/16/2002                           DISCHARGE SUMMARY - REFERRING   PROCEDURES:  1. Transesophageal echocardiogram.  2. Direct current cardioversion.   HOSPITAL COURSE:  Mr. Glassburn is a 70 year old male patient well known to Dr.  Charlies Constable.  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  He also has a history of pericarditis in 1991  and believes he had some atrial fibrillation at that time.  At approximately  2 o'clock on the day of admission, he had a sudden onset of weakness and  tachypalpitations.  He had a relative take his pulse and it was found to be  rapid and irregular.  He came to the emergency room and was found to be in  atrial fibrillation with a rapid ventricular response rate of 146.  He was  admitted by cardiology for further evaluation and treatment.   Mr. Blancett was started on I.V. Cardizem at 10 mg an hour.  His rate was  fairly well controlled.  It was felt that he had poor tolerance for the  atrial fibrillation and TEE cardioversion was recommended with the emphasis  on maintaining sinus rhythm.  Additionally, he was started on heparin per  pharmacy for anticoagulation.   The TEE cardioversion was performed on Jul 15, 2002.  It showed left atrial  appendage, right atrium and left atrium, without masses.  There was patent  foramen ovale.  He had moderate MR and his aortic valve was trileaflet with  moderate AI.  His tricuspid valve was normal.  He had normal left  ventricular function with no significant atherosclerotic plaquing of the  thoracic aorta.  He was sedated with Versed  and fentanyl for the TEE and  then was cardioverted with 360 joules synchronized energy.  There were no  complications.  Post procedure, he was in sinus rhythm.  There was an  incomplete left bundle branch block noted on the EKG because of a slightly  widened QRS at 116 milliseconds, but otherwise no abnormalities.   Once he was cardioverted and in sinus rhythm, the Cardizem was changed to  p.o. and the eventual dosage was 180 mg q.d.  For anticoagulation, the use  of Lovenox instead of heparin was explored and it was found to be possible.  The patient agreed to give himself the injections and received instruction  on this.  He was successfully converted to Lovenox and is to continue on the  Coumadin with outpatient followup.  The patient has a history of  hyperlipidemia and cholesterol profile checked which showed a total  cholesterol of 175, triglycerides 84, HDL 61, LDL 96.  No medications were  changed.   Mr. Couser was ambulating without chest pain or shortness of breath and  maintaining sinus rhythm on Jul 16, 2002.  He was considered stable for  discharge with outpatient followup to be arranged.   LABORATORY VALUES:  Hemoglobin 12.8.  Hematocrit 37.7.  WBC 10.9.  Platelets  205.  INR at discharge 0.9.  Sodium 141.  Potassium 3.9.  Chloride 107.  CO2  26.  BUN 19.  Creatinine 1.0.  Glucose 125.  Serial CK MB and troponin I  negative for MI.  TSH 2.988 with a free T4 of 0.97.   DISCHARGE CONDITION:  Improved.    DISCHARGE DIAGNOSES:  1. Atrial fibrillation with rapid ventricular response, status post     transesophageal echocardiography cardioversion this admission.  2. 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  3. Hyperlipidemia.  4. Hypertension.  5. Allergy to NAPROSYN and ALEVE.  6. History of back surgery in 1978.  7. History of pericarditis with atrial fibrillation in 1991.  8. Status post tonsillectomy.   DISCHARGE INSTRUCTIONS:   His activity level is to be as tolerated.  He is to  stick to a low fat diet.  He is to use steroid cream as needed to any injury  from the cardioversion.  He is to follow up in the [facility] on Friday  at 2:45 p.m.  He is to get a 2-D echocardiogram on Friday at 1:30.  He is to  follow up with Dr. Debby Bud as scheduled.  He has a P.A. appointment for Dr.  Juanda Chance on May 18th at 4:30 p.m.   DISCHARGE MEDICATIONS:  1. Aspirin 81 mg q.d.  2. Altace 5 mg q.d.  3. Lipitor 40 mg q.d.  4. Coumadin 5 mg q.d. or as directed.  5. Cardizem CD 180 mg q.d.  6. Lovenox 80 mg subcu approximately q.12h until his INR is greater than or     equal to 2.0.     Lavella Hammock, P.A. [facility]                  Charlton Haws, M.D.    RG/MEDQ  D:  07/16/2002  T:  07/17/2002  Job:  [id number]   cc:   Charlies Constable, M.D.   Rosalyn Gess Norins, M.D. [facility]

## 2010-07-23 NOTE — Assessment & Plan Note (Signed)
[facility]                            CARDIOLOGY OFFICE NOTE   NAME:[name] [name], [name] [name]                          MRN:          [id number]  DATE:02/17/2006                            DOB:          [birth year]    PRIMARY CARE PHYSICIAN:  Rosalyn Gess. Norins, M.D.   CLINICAL HISTORY:  [name] [name] is 70 years old and in July 2006,  underwent aortic and mitral valve replacement with tissue valves at the  [facility].  Initially he had some mild left ventricular  dysfunction but this has normalized and on his last echocardiogram his  ejection fraction was 55-65%.  He says he has been doing fairly well.  He does have a history of paroxysmal atrial fibrillation and he has had  some palpations but no symptoms suggestive of fibrillation which he  recognized from his previous experience.  I believe he had a maze  procedure done with his valve replacements.  He has had no chest pain or  shortness of breath.   PAST MEDICAL HISTORY:  1. Hyperlipidemia.  2. He also has a history of pericarditis.  3. He had perioperative nerve injury which I think was the ulnar      nerve.   CURRENT MEDICATIONS:  Toprol, Vytorin, Altace, and multivitamin.   PHYSICAL EXAMINATION:  VITAL SIGNS:  Blood pressure is 119/81, pulse 67  and regular.  NECK:  There was no vein distention.  The carotid pulses were full  without bruits.  CHEST:  Clear.  HEART:  Rhythm was regular.  There were normal tissue prosthetic closure  valve sounds.  I could hear no significant murmur.  ABDOMEN:  Soft with normal bowel sounds.  There is no  hepatosplenomegaly.  EXTREMITIES:  Peripheral pulses are full.  There is no peripheral edema.   An electrocardiogram showed a sinus rhythm.  It was normal.   IMPRESSION:  1. Bi-valvular heart disease, status post aortic and mitral valve      replacement tissue valves and maze procedure, July 2006, at the      [facility].  2. History of paroxysmal atrial  fibrillation.  3. Hyperlipidemia.  4. History of remote pericarditis.  5. Ejection fraction 55-60%, improved by recent echocardiogram.  6. Status post lumbar disk surgery, January 2007.   RECOMMENDATIONS:  I think Noris's cardiac condition is stable.  We will  plan to get an echocardiogram to re-evaluate his valves in January.  We  will get a lipid profile today.  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  I will plan to see  him back in 6 months.     Bruce Elvera Lennox Juanda Chance, MD, [facility]  Electronically Signed    BRB/MedQ  DD: 02/17/2006  DT: 02/17/2006  Job #: [id number]

## 2010-07-23 NOTE — H&P (Signed)
NAME:  [name] [name], [name] [name]                 ACCOUNT NO.:  [id number]   MEDICAL RECORD NO.:  [id number]          PATIENT TYPE:  AMB   LOCATION:  SDS                          FACILITY:  [facility]   PHYSICIAN:  Payton Doughty, M.D.      DATE OF BIRTH:  May 27, 1940   DATE OF ADMISSION:  03/09/2005  DATE OF DISCHARGE:                                HISTORY & PHYSICAL   ADMITTING DIAGNOSES:  Herniated disk L4-5.   This is a 70 year old right-handed white gentleman who had diskectomy in the  late 70's at 5-1.  Did well.  For the past few months he has had increasing  pain in his back down to his left lower extremity from doing his cardiac  rehab after having 2 heart valves replaced.  It has gotten worse as far as  keeping him awake now.  MR is obtained shows a disk at 4-5 diskectomy.   MEDICAL HISTORY:  Both heart valves were replaced.   MEDICATIONS:  1.  Altace 5 mg a day.  2.  Toprol 150 mg a day.  3.  Vytorin 10/20 1 a day.  4.  Coumadin 5 and 7.5 on alternating days.  I talked to Dr. Juanda Chance who said      it is okay to stop his Coumadin.   ALLERGIES:  NAPROSYN.   PAST SURGICAL HISTORY:  Vasectomy.   SOCIAL HISTORY:  He does not smoke.  He is a social drinker.  Retired  Pensions consultant.   FAMILY HISTORY:  Both parents are deceased.  Father had stomach cancer.   REVIEW OF SYSTEMS:  Remarkable for glasses, leg pain and back pain.  HEENT:  Within normal limits.  He has reasonable range of motion in neck.  CHEST:  Clear.  CARDIAC:  Regular rate and rhythm with a slight murmur.  ABDOMEN:  Nontender with no hepatosplenomegaly.  EXTREMITIES:  Without cyanosis or  clubbing.  GU EXAM: Deferred.  Peripheral pulses are good.  NEUROLOGICAL:  He is awake, alert and oriented.  His cranial nerves appear to function  normally.  Motor exam shows 5/5 strength throughout the lower extremity.  The same for the dorsiflexion of left 5-/5.  His left L5 sensory  dysesthesia, reflexes are 2 at the knees, flicker at the ankles  bilaterally.  Straight leg raise is very positive on the left.  MR demonstrates a central  disk at L4-5.   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   PLAN:  Lumbar laminectomy, diskectomy.   He has been off of his Coumadin for 5 days.  His coags are alright.  We will  proceed today.  The risks and benefits have been discussed with him.  He  wishes to proceed.           ______________________________  Payton Doughty, M.D.     MWR/MEDQ  D:  03/09/2005  T:  03/09/2005  Job:  [id number]

## 2010-07-23 NOTE — H&P (Signed)
NAME:  [name] [name], [name] [name]                           ACCOUNT NO.:  [id number]   MEDICAL RECORD NO.:  [id number]                   PATIENT TYPE:  EMS   LOCATION:  MAJO                                 FACILITY:  [facility]   PHYSICIAN:  Charlton Haws, M.D.                  DATE OF BIRTH:  1941/01/14   DATE OF ADMISSION:  07/14/2002  DATE OF DISCHARGE:                                HISTORY & PHYSICAL   HISTORY OF PRESENT ILLNESS:  The patient is a 70 year old patient of Dr.  Charlies Constable.  He has known rheumatic heart disease with aortic and mitral  insufficiency.  He had a bout of pericarditis in 1991 which was fairly  involved, and at that time he may have had some atrial fibrillation.  He has  not had any recurrent problems.  He is not on any anticoagulants.  Approximately 2 o'clock today, he felt weak and somewhat flushed and noted  his pulse to be very fast.  His son, Augusto Gamble, who works for [employer], noted  that he was probably in atrial fibrillation at a rate of 140 and brought him  to the emergency room.  Dr. Juanda Chance has seen the patient twice a year.  His  regular echocardiograms, from the sound of it, he has fairly significant AR  and MR and knows that at some point in the future he may need valve surgery.  He has not had any fevers.  He does follow SBE prophylaxis.  He has not had  any significant chest pain.  He tells me he had a negative stress test in  the last two years.   ALLERGIES:  NAPROSYN and ALEVE which cause hives.   MEDICATIONS:  1. Altace 5 mg a day for blood pressure.  2. Lipitor 40 mg a day for hypercholesterolemia.   REVIEW OF SYSTEMS:  Otherwise benign.   SOCIAL HISTORY:  He is a Clinical research associate in town and enjoys walking, traveling, and  fly fishing.  He is happily married.  His son Augusto Gamble, who works for [employer],  was here in the emergency room with him and his wife.   PHYSICAL EXAMINATION:  VITAL SIGNS:  He is in atrial fibrillation at a rate  of 152.  Blood pressure  150/70.  LUNGS:  Clear.  NECK:  Carotids are normal.  CARDIAC:  There is an S1 and S2.  Heart sounds are somewhat difficult to  hear due to the tachycardia, but he did have an MR murmur.  ABDOMEN:  Benign.  EXTREMITIES:  Intact pulses, no edema.   LABORATORY DATA:  Electrocardiogram shows atrial fibrillation but no acute  changes.   Chest x-ray and labs are pending.   IMPRESSION:  The patient clearly states that he thinks the tachycardia  started at 2 o'clock today.  Theoretically, if he does not convert on his  own, he could be cardioverted without anticoagulation  tomorrow; however, I  would favor watching him and possibly doing a TEE cardioversion on Tuesday  since, on occasion, patients are misled at time of their onset of atrial  fibrillation.   He will be given Cardizem here in the emergency room.  The first 10 mg bolus  did slow him down into the 110 range, and he will be placed 10 mg per hour  drip.   Heparin will be started.   We will do a 2-D echocardiogram in the morning to further assess his  leakiness.  It may be that he will need to have a right and left heart  catheterization as onset of atrial fibrillation can be a sign of worsening  regurgitation.   I will let Dr. Juanda Chance decide in the morning what approach he wants to take  in terms of cardioverting the patient if he does not do it spontaneously.  We will follow through with rate control and anticoagulation today.  I  discussed this with the patient, his son, and his wife, and they are all in  agreement.                                               Charlton Haws, M.D.    PN/MEDQ  D:  07/14/2002  T:  07/14/2002  Job:  [id number]

## 2010-07-23 NOTE — Assessment & Plan Note (Signed)
[facility]                              CARDIOLOGY OFFICE NOTE   NAME:Stetzel, Kabe                          MRN:          [id number]  DATE:10/27/2005                            DOB:          Jul 21, 1940    ATTENDING PHYSICIAN STATEMENT FOR CONTINUED DISABILITY:   CLAIM NUMBER:  [id number].   DIAGNOSES:   A. ACTIVE DIAGNOSES:  1. Valvular heart disease, status post mitral and aortic valve replacement      with tissue valves in July 2006.  2. Paroxysmal atrial fibrillation.  3. Hyperlipidemia.   B. SYMPTOMS:  1. Fatigue and shortness of breath with exertion.  2. Episodic palpitations.   C. CLINICAL FINDINGS/DIAGNOSTIC TESTS:  There is normal valve function on  physical examination and on recent echocardiogram.   D.:  The patient's heart surgery was performed at the [facility].   TREATMENT PLAN:   A. CURRENT MEDICATIONS:  1. Toprol 175 mg daily.  2. Vytorin 10/20 daily.  3. Altace 5 mg daily.  4. Multivitamins.  5. Fish oil.   He is also on a salt-restricted diet.   B.:  The patient is compliant.   PROGRESS:  The patient has improved since surgery and is currently  clinically stable.   DATES AND FREQUENCY OF MEDICAL CARE:  The patient has been seen every 3-4  months.   ACTIVITY AND RESTRICTIONS:  A.  The patient is currently at limited  activities.  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  B.  We have recommended that the patient restrict his professional  activities because of his cardiac condition.  C.  Mr. Gettel can work part time in a non-stressful environment.  We do not  think he should be involved in stressful professional activities and should  not be involved in active law practice or administration.  D.  See progress notes.   LAST PROGRESS NOTE:  E.  The patient is doing part-time staff work at  his  law firm but is not involved in active practice of law and no longer has his  license to Audiological scientist, and has not been involved in administrative  activities at his law firm.   PROGNOSIS:  A.  I anticipate that the patient will have the current  restrictions for the remainder of his life.  B.  Not applicable.  C.  Yes.  The patient can work part time in a non-stressful environment.  D.  Not applicable.  E.  No.   The patient is capable of handling funds.   REMARKS:  The patient has now recovered from open heart surgery with  replacement of aortic and mitral valves with tissue valves and had a maze  procedure performed for the treatment of atrial fibrillation.  He is still  limited by fatigue and decreased exercise tolerance from his underlying  cardiac condition and still has the potential for recurrent cardiac  arrhythmia, specifically atrial fibrillation.  His  condition has stabilized,  and we do not expect it to either improve or deteriorate.  His current  condition does not permit him to be active in law practice or active in  administration of a Scientist, water quality. We have recommended that it would not be  in his medical best interest to be involved in these stressful activities.                               Bruce Elvera Lennox Juanda Chance, MD, [facility]    BRB/MedQ  DD:  10/26/2005  DT:  10/26/2005  Job #:  [id number]

## 2010-07-23 NOTE — H&P (Signed)
NAME:  [name] [name], [name] [name] NO.:  [id number]   MEDICAL RECORD NO.:  [id number]          PATIENT TYPE:  INP   LOCATION:  1824                         FACILITY:  [facility]   PHYSICIAN:  Rollene Rotunda, M.D.   DATE OF BIRTH:  03-31-1940   DATE OF ADMISSION:  07/26/2004  DATE OF DISCHARGE:                                HISTORY & PHYSICAL   PRIMARY CARE PHYSICIAN:  Dr. Debby Bud   CARDIOLOGIST:  Dr. Charlies Constable   REASON FOR ADMISSION:  Evaluate patient with atrial fibrillation.   HISTORY OF PRESENT ILLNESS:  The patient is a pleasant 70 year old gentleman  with a history of rheumatic heart disease and report of mitral  regurgitation/aortic insufficiency.  He has had atrial fibrillation in the  past and required cardioversion in 2004 in May and then again in September.  He has had very mild palpitations since then without any persistent  tachyarrhythmias and no documented atrial fibrillation.  He is maintained on  Coumadin and rate lowering drugs as described.  He is followed by Dr. Juanda Chance  routinely for his valve disease and had his most recent echocardiogram in  January.   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  When  he sat down he noticed that his heart was racing.  He felt fuzzy.  He did  not have presyncope or syncope.  He had no chest discomfort, neck  discomfort, arm discomfort, activity-induced nausea, vomiting, excessive  diaphoresis.  He is otherwise active and enjoys walking without any  limitations.   PAST MEDICAL HISTORY:  1.  Rheumatic heart disease as a child.  2.  Mitral regurgitation.  3.  Aortic insufficiency.  4.  Pericarditis in 1991.  5.  Marginal hypertension.   PAST SURGICAL HISTORY:  Back surgery.   ALLERGIES:  NAPROSYN and ALEVE caused hives.   MEDICATIONS:  1.  Cartia 240 mg daily.  2.  Metoprolol XL 50 mg daily.  3.  Altace 5 mg daily.  4.  Coumadin.   SOCIAL HISTORY:  Patient is married.  Has children,  but no grandchildren.  He is a retired Clinical research associate.  Enjoys history.  Does not smoke cigarettes or drink  alcohol routinely.   FAMILY HISTORY:  Noncontributory for early coronary artery disease.   REVIEW OF SYSTEMS:  As stated in the HPI and otherwise negative for all  other systems.   PHYSICAL EXAMINATION:  GENERAL:  Patient is in no distress.  VITAL SIGNS:  Blood pressure 140/65, heart rate 92 and irregular.  HEENT:  Eyelids unremarkable.  Pupils are equal, round, and reactive to  light.  Fundi not visualized.  Oral mucosa unremarkable.  NECK:  No jugular venous distention.  Wave form within normal limits.  Carotid upstroke brisk and symmetrical.  No bruits, thyromegaly.  LYMPHATICS:  No cervical, axillary, or inguinal adenopathy.  LUNGS:  Clear to auscultation bilaterally.  BACK:  No costovertebral angle tenderness.  CHEST:  Unremarkable.  HEART:  PMI not displaced or sustained.  S1, S2 within normal limits.  No  S3.  3/6 apical and axillary  holosystolic murmur.  No diastolic murmur is  appreciated.  ABDOMEN:  Flat.  Positive bowel sounds.  Normal in frequency and pitch.  No  bruits, rebound, guarding, midline pulse.  No mass, hepatomegaly,  splenomegaly.  SKIN:  No rashes.  No nodules.  EXTREMITIES:  2+ pulses throughout.  No edema.  No cyanosis.  No clubbing.  NEUROLOGIC:  Oriented to person, place, and time.  Cranial nerves II-XII  grossly intact.  Motor grossly intact.   EKG:  Atrial fibrillation, rate 90s, right axis deviation, poor anterior R-  wave progression with a possible anteroseptal myocardial infarction, no  acute ST-T wave changes.   LABORATORIES:  Sodium 139, potassium 4.1, chloride 109, BUN 21.  INR 3.6.  Point of care markers negative.  Creatinine 0.9.  Other laboratories  pending.  Chest x-ray:  Cardiomegaly, double density consistent with left  atrial enlargement.  No air space disease.   ASSESSMENT/PLAN:  1.  Atrial fibrillation.  The patient has recurrent  persistent atrial      fibrillation.  In the past he has needed to be cardioverted with this.      However, unfortunately, he has subtherapeutic INR less than three weeks      ago.  He will need a TEE guided cardioversion.  Will obtain his      echocardiogram from the office.  It was done in January.  He is now      therapeutic on his Coumadin and will remain on this.  He will remain on      his beta blocker and calcium channel blocker.  At this point I think he      has been doing well without specific antiarrhythmic therapy and I will      continue to not employ this.  2.  Dyslipidemia.  Continue his Vytorin.  3.  Hypertension.  He will continue the medications as listed.      JH/MEDQ  D:  07/27/2004  T:  07/27/2004  Job:  [id number]   cc:   Rosalyn Gess. Norins, M.D. [facility]   Charlies Constable, M.D. [facility]

## 2010-07-23 NOTE — Op Note (Signed)
NAME:  [name] [name], [name] [name]                 ACCOUNT NO.:  [id number]   MEDICAL RECORD NO.:  [id number]          PATIENT TYPE:  INP   LOCATION:  6533                         FACILITY:  [facility]   PHYSICIAN:  Olga Millers, M.D. LHCDATE OF BIRTH:  03-Apr-1940   DATE OF PROCEDURE:  07/28/2004  DATE OF DISCHARGE:                                 OPERATIVE REPORT   This is a procedure note concerning cardioversion of atrial fibrillation.  The patient was sedated by anesthesia with Pentothal.  Synchronized  cardioversion with 120 joules (biphasic) resulted in normal sinus rhythm.  There were no immediate complications.  We will recommend continuing his  Coumadin.      BC/MEDQ  D:  07/28/2004  T:  07/28/2004  Job:  [id number]

## 2010-07-23 NOTE — Assessment & Plan Note (Signed)
[facility]                            CARDIOLOGY OFFICE NOTE   NAME:[name], [name]                          MRN:          [id number]  DATE:10/18/2006                            DOB:          Jul 14, 1940    CARDIOLOGY DISABILITY STATEMENT:   DIAGNOSES:  A.  ACTIVE DIAGNOSES:  1. 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  2. Paroxysmal atrial fibrillation.  3. Hyperlipidemia.   B.  SYMPTOMS:  1. Fatigue and shortness of breath with exertion.  2. Episodic palpitations.   C.  CLINICAL  FINDINGS AND DIAGNOSTIC TESTS:  1. Echocardiography in January 2008 showed good left ventricular      function with good functioning aortic Carpentier-Edwards valve      prosthesis and good functioning Carpentier-Edwards mitral valve      prosthesis.  2. There is normal valve function on physical examination.   D.  The patient had heart surgery at [facility] in 2006.   TREATMENT PLAN:  A.  CURRENT MEDICATIONS:  1. Toprol 175 mg daily.  2. Vytorin 10/20 daily.  3. Altace 5 mg daily.  4. Multivitamins.  5. Fish oil 1000 mg daily.  6. Aspirin 81 mg daily.  7. Vitamins.   The patient also is on salt-restricted, heart-healthy diet.   B.  The patient is compliant.   PROGRESS:  His condition has improved.   DATE AND FREQUENCY OF MEDICAL CARE:  The patient has been seen every 3-6  months.   ACTIVITIES AND RESTRICTIONS:  A.  The patient is still at limited  activity. 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   B.  We have recommended the patient restrict his professional activities  because of his cardiac conditions.   C.  [name] [name] can still work part time in a nonstressful environment.  We do not think he should be in stressful professional activities and  should not be involved  in active law practice or administration.   D.  See above.   E.  The medical basis of these recommendations is the patient has  undergone double cardiac valve replacement with symptoms of fatigue and  symptoms of episodic palpitations related to paroxysmal atrial  fibrillation and the need for continued medications.   F.  [name] [name] is working part time but is not involved in active Museum/gallery conservator or administration.   PROGNOSIS:  A.  I anticipate the patient will have the current  restrictions permanently.   B.  Not applicable.   C.  Yes.  The patient can work part time in a nonstressful environment.   D.  Not applicable.   E.  No.   HANDLING FUNDS:  The patient is capable of handling funds.   REASON FOR ADMISSION:  [name] [name] has recovered from open heart surgery  with replacement of both his aortic and mitral valves with  tissue  valves.  He also had a maze procedure performed for the treatment of  atrial fibrillation.  He has a history of episodic palpitations and a  history of atrial fibrillation.  He is still limited by fatigue and  decreased exercise tolerance due to his underlying cardiac condition and  cardiac arrhythmia.  His condition is now stable, and I do not expect  any major improvement or deterioration in the near term.  His condition  does not permit him to be active in law practice or active in  administration of a Scientist, water quality.  We have recommended it would not be  in his medical best interest to be involved in these stressful  activities.     Bruce Elvera Lennox Juanda Chance, MD, [facility]  Electronically Signed    BRB/MedQ  DD: 10/18/2006  DT: 10/20/2006  Job #: [id number]   cc:   Carlyon Shadow, RN

## 2010-07-23 NOTE — Discharge Summary (Signed)
NAME:  [name] [name], VARMA NO.:  [id number]   MEDICAL RECORD NO.:  [id number]          PATIENT TYPE:  INP   LOCATION:  6533                         FACILITY:  [facility]   PHYSICIAN:  Olga Millers, M.D. LHCDATE OF BIRTH:  May 26, 1940   DATE OF ADMISSION:  07/26/2004  DATE OF DISCHARGE:  07/28/2004                           DISCHARGE SUMMARY - REFERRING   HISTORY OF PRESENT ILLNESS:  Mr. Gullion is a 70 year old male who has a  history of atrial fibrillation with cardioversion in 1994 x2 while giving a  talk at a historical meeting.  When he sat down, he noticed that his heart  was racing and he felt fuzzy.  He did not have any syncope or associated  symptoms otherwise.  His medical history is also notable for rheumatic heart  disease as a child, mitral regurgitation, aortic insufficiency, pericarditis  in 1991, marginal hypertension.   LABORATORY DATA:  Admission hemoglobin and hematocrit was 11.6 and 33.6,  normal indices, platelets 300,000.  WBC of 11.4.  PT 25.6, INR 3.4.  On Jul 28, 2004 at the time of discharge, PT was 24.6, INR 3.2.  Admission sodium  was 141, potassium 4.0, BUN 22, creatinine 0.9, glucose 111.  Liver function  tests were normal except for ALT that was slightly elevated at 46.  TSH was  3.164.  EKG on admission showed atrial fibrillation, left ventricular  hypertrophy, delayed R wave progression.  Subsequent EKG showed atrial  fibrillation.  Chest x-ray showed cardiomegaly and vascular congestion.   HOSPITAL COURSE:  Mr. Gahm was admitted to [facility] with  symptomatic atrial fibrillation.  Dr. Antoine Poche had noted that he had had a  subtherapeutic INR within the last three weeks ago, therefore it was felt  that he would need a TEE-guided cardioversion.  He was admitted on his home  medications.  Dr. Graciela Husbands saw the patient on Jul 27, 2004 and agreed with TEE  cardioversion.  He also commented that he may need ________________ when it  comes  time for valve surgery and possibly Mayes procedure.  On Jul 28, 2004,  Dr. Jens Som performed TEE cardioversion.  The TEE showed an ejection  fraction of 55%, moderate left atrial enlargement,  no thrombus or  pericardial effusion, thickened mitral valve with mild rheumatic deformity  of the anterior mitral valve leaflet and restricted posterior leaflet,  severe MR, mild MS, mild TR, trace PI.  Cardioversion was performed using  120 joules biphasic, restoring normal sinus rhythm without complications.  Dr. Jens Som noted that if the patient remained stable, that he would be  discharged home later that evening.  It was noted that while ambulating, he  did have a short episode of sinus tachycardia which was asymptomatic and  resolved within seconds.   DISCHARGE DIAGNOSES:  1.  Recurrent atrial fibrillation with rapid ventricular rate status post      TEE cardioversion.  2.  History as previously.   DISPOSITION:  The patient is discharged home.  His medications include:  1.  Coumadin 7.5 mg daily except for 10 mg Monday and Friday.  2.  Toprol XL 50 mg daily.  3.  Cardia XP 240 mg daily.  4.  Altace 5 mg daily.  5.  Vytorin 10/20 daily.  6.  Aspirin 81 daily.   He is asked to maintain a low salt, fat, cholesterol diet.  He will have a  PT INR checked on Wednesday, Aug 04, 2004 at 10:45 a.m.  He will see Dr.  Jens Som in the office on September 01, 2004 at 9:40 a.m. for follow up.  It is  noted that he is following up with Dr. Jens Som since Dr. Juanda Chance is out of  the office until some time in August.      EW/MEDQ  D:  07/28/2004  T:  07/28/2004  Job:  [id number]   cc:   Rosalyn Gess. Norins, M.D. [facility]   Charlies Constable, M.D. [facility]

## 2011-01-12 ENCOUNTER — Telehealth: Payer: Self-pay | Admitting: Cardiology

## 2011-01-12 NOTE — Telephone Encounter (Signed)
PT Signed Release, Called he will pick up records today 01/12/11/km

## 2011-02-08 ENCOUNTER — Telehealth: Payer: Self-pay | Admitting: Cardiology

## 2011-02-08 NOTE — Telephone Encounter (Signed)
Needs refill crestor 90 day supply, with 1 year refills, uses [facility]

## 2011-02-09 ENCOUNTER — Other Ambulatory Visit: Payer: Self-pay | Admitting: *Deleted

## 2011-02-09 MED ORDER — ROSUVASTATIN CALCIUM 20 MG PO TABS: 20.0000 mg | ORAL_TABLET | Freq: Every day | ORAL | Status: DC

## 2011-02-14 MED ORDER — ROSUVASTATIN CALCIUM 20 MG PO TABS: 20.0000 mg | ORAL_TABLET | Freq: Every day | ORAL | Status: DC

## 2011-02-14 NOTE — Telephone Encounter (Signed)
Fu call Pt said [facility] did not receive the refill request for crestor. He is upset. Please call him back when done.

## 2011-03-18 ENCOUNTER — Ambulatory Visit (INDEPENDENT_AMBULATORY_CARE_PROVIDER_SITE_OTHER): Payer: Medicare Other | Admitting: Cardiology

## 2011-03-18 ENCOUNTER — Encounter: Payer: Self-pay | Admitting: Cardiology

## 2011-03-18 DIAGNOSIS — I359 Nonrheumatic aortic valve disorder, unspecified: Secondary | ICD-10-CM | POA: Diagnosis not present

## 2011-03-18 DIAGNOSIS — Z9889 Other specified postprocedural states: Secondary | ICD-10-CM

## 2011-03-18 DIAGNOSIS — I4891 Unspecified atrial fibrillation: Secondary | ICD-10-CM

## 2011-03-18 DIAGNOSIS — I059 Rheumatic mitral valve disease, unspecified: Secondary | ICD-10-CM

## 2011-03-18 DIAGNOSIS — E785 Hyperlipidemia, unspecified: Secondary | ICD-10-CM | POA: Diagnosis not present

## 2011-03-18 LAB — LIPID PANEL
Cholesterol: 169 mg/dL (ref 0–200)
LDL Cholesterol: 101 mg/dL — ABNORMAL HIGH (ref 0–99)
Total CHOL/HDL Ratio: 4
Triglycerides: 106 mg/dL (ref 0.0–149.0)

## 2011-03-18 LAB — CBC WITH DIFFERENTIAL/PLATELET
Basophils Absolute: 0.1 10*3/uL (ref 0.0–0.1)
Basophils Relative: 0.9 % (ref 0.0–3.0)
Hemoglobin: 14.5 g/dL (ref 13.0–17.0)
Lymphocytes Relative: 20 % (ref 12.0–46.0)
Lymphs Abs: 2.1 10*3/uL (ref 0.7–4.0)
MCV: 95.7 fl (ref 78.0–100.0)
Monocytes Absolute: 0.5 10*3/uL (ref 0.1–1.0)
Monocytes Relative: 5.2 % (ref 3.0–12.0)
Neutro Abs: 7.4 10*3/uL (ref 1.4–7.7)
Neutrophils Relative %: 70.6 % (ref 43.0–77.0)
Platelets: 257 10*3/uL (ref 150.0–400.0)
RBC: 4.44 Mil/uL (ref 4.22–5.81)
RDW: 13.2 % (ref 11.5–14.6)
WBC: 10.4 10*3/uL (ref 4.5–10.5)

## 2011-03-18 LAB — HEPATIC FUNCTION PANEL
ALT: 36 U/L (ref 0–53)
Albumin: 4.2 g/dL (ref 3.5–5.2)
Alkaline Phosphatase: 60 U/L (ref 39–117)
Bilirubin, Direct: 0.1 mg/dL (ref 0.0–0.3)
Total Protein: 7.2 g/dL (ref 6.0–8.3)

## 2011-03-18 LAB — BASIC METABOLIC PANEL
Calcium: 9.2 mg/dL (ref 8.4–10.5)
Chloride: 108 mEq/L (ref 96–112)
Creatinine, Ser: 0.9 mg/dL (ref 0.4–1.5)
GFR: 95.14 mL/min (ref >60.00)
Glucose, Bld: 114 mg/dL — ABNORMAL HIGH (ref 70–99)
Potassium: 4.4 mEq/L (ref 3.5–5.1)
Sodium: 142 mEq/L (ref 135–145)

## 2011-03-18 LAB — TSH: TSH: 2.13 u[IU]/mL (ref 0.35–5.50)

## 2011-03-18 NOTE — Patient Instructions (Signed)
Your physician recommends that you have  a FASTING lipid profile /liver profile/BMET/CBC/TSH today.  Your physician has requested that you have an echocardiogram. Echocardiography is a painless test that uses sound waves to create images of your heart. It provides your doctor with information about the size and shape of your heart and how well your heart's chambers and valves are working. This procedure takes approximately one hour. There are no restrictions for this procedure. November 2013  Your physician wants you to follow-up in: November 2013 with Dr Shirlee Latch after you have the echo. You will receive a reminder letter in the mail two months in advance. If you don't receive a letter, please call our office to schedule the follow-up appointment.

## 2011-03-19 NOTE — Assessment & Plan Note (Signed)
Preserved EF and normal bioprosthetic aortic and mitral valves in 11/12 on echo at [facility].  He needs endocarditis prophylaxis with dental work.  Continue yearly monitoring of valve, next echo in 11/13.

## 2011-03-19 NOTE — Assessment & Plan Note (Signed)
Check lipids/LFTs on Crestor.  

## 2011-03-19 NOTE — Progress Notes (Signed)
PCP: Dr. Debby Bud  71 yo with history of rheumatic fever and AI/MR s/p bioprosthetic AVR and MVR at the [facility] as well as MAZE procedure all in 2006 presents for cardiology followup.  Patient has been seen by Dr. Juanda Chance in the past and is seen by me for the first time today.  He has been doing well symptomatically.  He went for a followup at the [facility] in 11/12 and had an echo that per report showed normal prosthetic valves.  He exercises almost every day (exercise bike, walking, yardwork).  No exertional dyspnea.  No chest pain. Very rare palpitations.  He has not had documented atrial fibrillation since his operation (has had atrial tachycardia).  He is not taking coumadin.    ECG: NSR at 67  PMH: 1. Atrial fibrillation: s/p MAZE and LAA ligation in 2006.  No documented atrial fibrillation since operation.  2. Atrial tachycardia 3. 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  Last echo here in 5/11 with EF 60-65%, normal bioprosthetic aortic valve, normal bioprosthetic mitral valve, moderate LAE.  Echo 11/12 at [facility] looked ok per report.  4. Hyperlipidemia 5. L-spine surgery 6. LHC with no significant coronary disease in 2006 pre-op.   SH: Married, retired Pensions consultant [name], [place], [place]).  2 sons.  Never smoked.   FH: No premature CAD.   ROS: All systems reviewed and negative except as per HPI.   Current Outpatient Prescriptions  Medication Sig Dispense Refill  . aspirin 81 MG tablet Take 160 mg by mouth daily.      . Bilberry 60 MG CAPS Take by mouth daily.      . Coenzyme Q10-Vitamin E 100-300 MG-UNIT WAFR Take by mouth daily.      . fish oil-omega-3 fatty acids 1000 MG capsule Take 3 g by mouth daily.      . metoprolol (TOPROL-XL) 100 MG 24 hr tablet Take 150 mg by mouth daily.      . multivitamin (THERAGRAN) per tablet Take 1 tablet by mouth daily.      . Probiotic Product (PROBIOTIC  FORMULA PO) Take by mouth every other day.      . pyridOXINE (VITAMIN B-6) 100 MG tablet Take 100 mg by mouth daily.      . rosuvastatin (CRESTOR) 20 MG tablet Take 1 tablet (20 mg total) by mouth at bedtime.  90 tablet  0  . sildenafil (VIAGRA) 100 MG tablet Take 100 mg by mouth as needed.      [name] Zn-Pyg Afri-Nettle-Saw Palmet (SAW PALMETTO COMPLEX PO) Take by mouth daily.        BP 136/88  Pulse 71  Ht 5\' 8"  (1.727 m)  Wt 89.812 kg (198 lb)  BMI 30.11 kg/m2 General: NAD Neck: No JVD, no thyromegaly or thyroid nodule.  Lungs: Clear to auscultation bilaterally with normal respiratory effort. CV: Nondisplaced PMI.  Heart regular S1/S2, no S3/S4, 2/6 early SEM RUSB.  No peripheral edema.  No carotid bruit.  Normal pedal pulses.  Abdomen: Soft, nontender, no hepatosplenomegaly, no distention.  Neurologic: Alert and oriented x 3.  Psych: Normal affect. Extremities: No clubbing or cyanosis.

## 2011-03-19 NOTE — Assessment & Plan Note (Signed)
No documented recurrence since his MAZE procedure in 2006.  He had left atrial appendage ligation.  At this time, he is only on aspirin.  He has had documented atrial tachycardia but no recent sustained palpitations.

## 2011-04-13 ENCOUNTER — Encounter: Payer: Self-pay | Admitting: Internal Medicine

## 2011-04-21 DIAGNOSIS — Z23 Encounter for immunization: Secondary | ICD-10-CM | POA: Diagnosis not present

## 2011-04-25 ENCOUNTER — Ambulatory Visit (INDEPENDENT_AMBULATORY_CARE_PROVIDER_SITE_OTHER): Payer: Medicare Other | Admitting: Internal Medicine

## 2011-04-25 ENCOUNTER — Encounter: Payer: Self-pay | Admitting: Internal Medicine

## 2011-04-25 DIAGNOSIS — E785 Hyperlipidemia, unspecified: Secondary | ICD-10-CM

## 2011-04-25 DIAGNOSIS — F528 Other sexual dysfunction not due to a substance or known physiological condition: Secondary | ICD-10-CM

## 2011-04-25 DIAGNOSIS — I4891 Unspecified atrial fibrillation: Secondary | ICD-10-CM

## 2011-04-25 DIAGNOSIS — Z0000 Encounter for general adult medical examination without abnormal findings: Secondary | ICD-10-CM

## 2011-04-25 MED ORDER — SILDENAFIL CITRATE 100 MG PO TABS: 100.0000 mg | ORAL_TABLET | ORAL | Status: DC | PRN

## 2011-04-25 NOTE — Progress Notes (Signed)
Subjective:    Patient ID: [name] [name], male    DOB: 27-Dec-1940, 71 y.o.   MRN: [id number]  HPI The patient is here for annual Medicare wellness examination and management of other chronic and acute problems.   The risk factors are reflected in the social history.  The roster of all physicians providing medical care to patient - is listed in the Snapshot section of the chart.  Activities of daily living:  The patient is 100% inedpendent in all ADLs: dressing, toileting, feeding as well as independent mobility  Home safety : The patient has smoke detectors in the home. Falls - no falls. Discussed fall safe environment, grabs in bathroom. They wear seatbelts. firearms are present in the home, kept in a safe fashion. There is no violence in the home.   There is no risks for hepatitis, STDs or HIV. There is no   history of blood transfusion. They have no travel history to infectious disease endemic areas of the world.  The patient has seen their dentist in the last six month. They have not seen their eye doctor in the last year but is scheduled for 04/26/11. They admit to minor hearing difficulty and have not had audiologic testing in the last year.  They do not  have excessive sun exposure. Discussed the need for sun protection: hats, long sleeves and use of sunscreen if there is significant sun exposure.   Diet: the importance of a healthy diet is discussed. They do have a healthy diet.  The patient has a regular exercise program: treadmill/ bike , 1 hr duration,  5 per week.  The benefits of regular aerobic exercise were discussed.  Depression screen: there are no signs or vegative symptoms of depression- irritability, change in appetite, anhedonia, sadness/tearfullness.  Cognitive assessment: the patient manages all their financial and personal affairs and is actively engaged.   The following portions of the patient's history were reviewed and updated as appropriate: allergies, current  medications, past family history, past medical history,  past surgical history, past social history  and problem list.  Vision, hearing, body mass index were assessed and reviewed.   During the course of the visit the patient was educated and counseled about appropriate screening and preventive services including : fall prevention , diabetes screening, nutrition counseling, colorectal cancer screening, and recommended immunizations.  Past Medical History  Diagnosis Date  . Psychosexual dysfunction with inhibited sexual excitement   . Other and unspecified hyperlipidemia   . Lesion of ulnar nerve   . Lesion of ulnar nerve   . External hemorrhoids without mention of complication   . Acute rheumatic pericarditis   . Acute rheumatic heart disease, unspecified     childhood, age  8 & 10  . 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 Atrial fibrillation     history   No past surgical history on file. Family History  Problem Relation Age of Onset  . Stroke Mother   . Vision loss Mother     macular degeneration  . Cancer Father     intestinal/GI  . Diabetes Paternal Aunt    History   Social History  . Marital Status: Married    Spouse Name: N/A    Number of Children: N/A  . Years of Education: N/A   Occupational History  . Not on file.   Social History Main Topics  . Smoking status: Never Smoker   . Smokeless tobacco: Never Used  . Alcohol Use:  Yes     wine occasionally  . Drug Use: No  . Sexually Active: Yes -- Male partner(s)   Other Topics Concern  . Not on file   Social History Narrative   [name] - BS, [place]. married - '67. 2 sons - '74, '77  one son is a Oceanographer for [employer] - [employer]; 4 grandchildren. 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 marriage in good health. End-of-life: discussed issues and provided packet with the MOST form and out of facility order.   Current  Outpatient Prescriptions on File Prior to Visit  Medication Sig Dispense Refill  . aspirin 81 MG tablet Take 160 mg by mouth daily.      . Bilberry 60 MG CAPS Take by mouth daily.      . Coenzyme Q10-Vitamin E 100-300 MG-UNIT WAFR Take by mouth daily.      . fish oil-omega-3 fatty acids 1000 MG capsule Take 3 g by mouth daily.      . metoprolol (TOPROL-XL) 100 MG 24 hr tablet Take 150 mg by mouth daily.      . multivitamin (THERAGRAN) per tablet Take 1 tablet by mouth daily.      . Probiotic Product (PROBIOTIC FORMULA PO) Take by mouth every other day.      . pyridOXINE (VITAMIN B-6) 100 MG tablet Take 100 mg by mouth daily.      . rosuvastatin (CRESTOR) 20 MG tablet Take 1 tablet (20 mg total) by mouth at bedtime.  90 tablet  0  . Zn-Pyg Afri-Nettle-Saw Palmet (SAW PALMETTO COMPLEX PO) Take by mouth daily.          Review of Systems Constitutional:  Negative for fever, chills, activity change and unexpected weight change.  HEENT:  Negative for hearing loss, ear pain, congestion, neck stiffness and postnasal drip. Negative for sore throat or swallowing problems. Negative for dental complaints.   Eyes: Negative for vision loss or change in visual acuity.  Respiratory: Negative for chest tightness and wheezing. Negative for DOE.   Cardiovascular: Negative for chest pain or palpitations. No decreased exercise tolerance Gastrointestinal: No change in bowel habit. No bloating or gas. No reflux or indigestion Genitourinary: Negative for urgency, frequency, flank pain and difficulty urinating.  Musculoskeletal: Negative for myalgias, back pain, arthralgias and gait problem.  Neurological: Negative for dizziness, tremors, weakness and headaches.  Hematological: Negative for adenopathy.  Psychiatric/Behavioral: Negative for behavioral problems and dysphoric mood.       Objective:   Physical Exam Filed Vitals:   04/25/11 1334  BP: 112/68  Pulse: 71  Temp: 97.1 F (36.2 C)  Resp: 16    Gen'l: Well nourished well developed     male in no acute distress  HEENT: Head: Normocephalic and atraumatic. Right Ear: External ear normal. EAC/TM nl. Left Ear: External ear normal.  EAC/TM nl. Nose: Nose normal. Mouth/Throat: Oropharynx is clear and moist. Dentition - native, in good repair. No buccal or palatal lesions. Posterior pharynx clear. Eyes: Conjunctivae and sclera clear. EOM intact. Pupils are equal, round, and reactive to light. Right eye exhibits no discharge. Left eye exhibits no discharge. Neck: Normal range of motion. Neck supple. No JVD present. No tracheal deviation present. No thyromegaly present.  Cardiovascular: Normal rate, regular rhythm, no gallop, no friction rub, no murmur heard.      Quiet precordium. 2+ radial and DP pulses . No carotid bruits Pulmonary/Chest: Effort normal. No respiratory distress or  increased WOB, no wheezes, no rales. No chest wall deformity or CVAT. Abdominal: Soft. Bowel sounds are normal in all quadrants. He exhibits no distension, no tenderness, no rebound or guarding, No heptosplenomegaly  Genitourinary:   Musculoskeletal: Normal range of motion. He exhibits no edema and no tenderness.       Small and large joints without redness, synovial thickening or deformity. Full range of motion preserved about all small, median and large joints.  Lymphadenopathy:    He has no cervical or supraclavicular adenopathy.  Neurological: He is alert and oriented to person, place, and time. CN II-XII intact. DTRs 2+ and symmetrical biceps, radial and patellar tendons. Cerebellar function normal with no tremor, rigidity, normal gait and station.  Skin: Skin is warm and dry. No rash noted. No erythema.  Psychiatric: He has a normal mood and affect. His behavior is normal. Thought content normal.   Lab Results  Component Value Date   WBC 10.4 03/18/2011   HGB 14.5 03/18/2011   HCT 42.5 03/18/2011   PLT 257.0 03/18/2011   GLUCOSE 114* 03/18/2011   CHOL 169  03/18/2011   TRIG 106.0 03/18/2011   HDL 46.70 03/18/2011   LDLCALC 101* 03/18/2011   ALT 36 03/18/2011   AST 32 03/18/2011   NA 142 03/18/2011   K 4.4 03/18/2011   CL 108 03/18/2011   CREATININE 0.9 03/18/2011   BUN 19 03/18/2011   CO2 25 03/18/2011   TSH 2.13 03/18/2011   PSA 2.87 12/26/2008        Assessment & Plan:

## 2011-04-26 ENCOUNTER — Encounter: Payer: Self-pay | Admitting: Internal Medicine

## 2011-04-26 DIAGNOSIS — H5210 Myopia, unspecified eye: Secondary | ICD-10-CM | POA: Diagnosis not present

## 2011-04-26 DIAGNOSIS — H52229 Regular astigmatism, unspecified eye: Secondary | ICD-10-CM | POA: Diagnosis not present

## 2011-04-26 DIAGNOSIS — H40019 Open angle with borderline findings, low risk, unspecified eye: Secondary | ICD-10-CM | POA: Diagnosis not present

## 2011-04-26 DIAGNOSIS — Z0000 Encounter for general adult medical examination without abnormal findings: Secondary | ICD-10-CM | POA: Insufficient documentation

## 2011-04-26 DIAGNOSIS — H25019 Cortical age-related cataract, unspecified eye: Secondary | ICD-10-CM | POA: Diagnosis not present

## 2011-04-26 NOTE — Assessment & Plan Note (Signed)
Lab Results  Component Value Date   CHOL 169 03/18/2011   HDL 46.70 03/18/2011   LDLCALC 101* 03/18/2011   TRIG 106.0 03/18/2011   CHOLHDL 4 03/18/2011   Good control. On present medication. Will continue the same.

## 2011-04-26 NOTE — Assessment & Plan Note (Signed)
Interval medical history is unremarkable. Physical exam is normal. Labs from earlier are in normal limits. He is current with colorectal cancer screening. Immunizations are all up to date.   IN summary - a very nice man who is medically stable at this time. He is encouraged to continue an exercise regimen. Discussed weight management: smart food choices. PORTION SIZE CONTROL, continued exercise. Target weight 175, goal to loose 1 lb per month.  He is asked to return in 1 year or as needed.

## 2011-04-26 NOTE — Assessment & Plan Note (Signed)
Does well with viagra. Discussed symptoms of testosterone deficiency - he does not have many symptoms. Will not test at this time.

## 2011-04-26 NOTE — Assessment & Plan Note (Signed)
Stable with apparent normal rhythm on exam vs good rate control.

## 2011-06-29 ENCOUNTER — Telehealth: Payer: Self-pay | Admitting: *Deleted

## 2011-06-29 ENCOUNTER — Telehealth: Payer: Self-pay | Admitting: Cardiology

## 2011-06-29 ENCOUNTER — Other Ambulatory Visit: Payer: Self-pay | Admitting: Cardiology

## 2011-06-29 NOTE — Telephone Encounter (Signed)
New Problem:     Patient called in requesting multiple refills for his CRESTOR 20 MG tablet and metoprolol succinate (TOPROL-XL) 100 MG 24 hr tablet.

## 2011-06-29 NOTE — Telephone Encounter (Signed)
Called pt to reassure him that his medications (metoprolol & crestor) were called in this morning to [facility]. He was glad and said thank you.    Jhana Giarratano CMA

## 2011-12-12 ENCOUNTER — Ambulatory Visit (INDEPENDENT_AMBULATORY_CARE_PROVIDER_SITE_OTHER): Payer: Medicare Other | Admitting: Cardiology

## 2011-12-12 ENCOUNTER — Telehealth: Payer: Self-pay | Admitting: *Deleted

## 2011-12-12 ENCOUNTER — Encounter: Payer: Self-pay | Admitting: Cardiology

## 2011-12-12 ENCOUNTER — Telehealth: Payer: Self-pay | Admitting: Cardiology

## 2011-12-12 VITALS — BP 144/86 | HR 100 | Ht 68.0 in | Wt 200.0 lb

## 2011-12-12 DIAGNOSIS — I359 Nonrheumatic aortic valve disorder, unspecified: Secondary | ICD-10-CM | POA: Diagnosis not present

## 2011-12-12 DIAGNOSIS — I019 Acute rheumatic heart disease, unspecified: Secondary | ICD-10-CM

## 2011-12-12 DIAGNOSIS — I4891 Unspecified atrial fibrillation: Secondary | ICD-10-CM

## 2011-12-12 DIAGNOSIS — E785 Hyperlipidemia, unspecified: Secondary | ICD-10-CM | POA: Diagnosis not present

## 2011-12-12 MED ORDER — WARFARIN SODIUM 5 MG PO TABS: ORAL_TABLET | ORAL | Status: DC

## 2011-12-12 MED ORDER — DRONEDARONE HCL 400 MG PO TABS: 400.0000 mg | ORAL_TABLET | Freq: Two times a day (BID) | ORAL | Status: DC

## 2011-12-12 NOTE — Telephone Encounter (Signed)
Message copied by Jeannine Kitten on Mon Dec 12, 2011 12:43 PM ------      Message from: Jacqlyn Krauss      Created: Mon Dec 12, 2011 12:35 PM       Start on warfarin 5mg  today. Needs weekly INRs for possible cardioversion. He has an appt 12/15/11 with you.

## 2011-12-12 NOTE — Telephone Encounter (Signed)
Spoke with pt. Pt states while in [place] last week he developed an irregular pulse. I have given him an appt today at 10 am with Dr Shirlee Latch.

## 2011-12-12 NOTE — Progress Notes (Signed)
Patient ID: [name] [name], male   DOB: 17-Sep-1940, 71 y.o.   MRN: [id number] PCP: Dr. 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  He went for a followup at the [facility] in 11/12 and had an echo that per report showed normal prosthetic valves.  He has been exercising almost every day (exercise bike, walking, yardwork).  No exertional dyspnea.  No chest pain. He and his wife were in [place] for most of September and just came back to the [place] last week.  It was a stressful return as his mother-in-law became ill and ultimately died on 2022-08-25.  Last 08/22/22, patient began to feel runs of tachypalpitations.  He felt his heart beating fast and irregular from time to time.  He thought that he was out of rhythm so came in today for evaluation.  He has felt more fatigued than usual and has an awareness of his heart beat.   ECG: Atypical atrial flutter versus atrial tachycardia with rate around 100 bpm, QTc normal.   PMH: 1. Atrial fibrillation: s/p MAZE and LAA ligation in 2006.  No documented atrial fibrillation since operation.  2. Atrial tachycardia 3. 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  Last echo here in 5/11 with EF 60-65%, normal bioprosthetic aortic valve, normal bioprosthetic mitral valve, moderate LAE.  Echo 11/12 at [facility] looked ok per report.  4. Hyperlipidemia 5. L-spine surgery 6. LHC with no significant coronary disease in 2006 pre-op.   SH: Married, retired Pensions consultant [name], [place], [place]).  2 sons.  Never smoked.   FH: No premature CAD.   ROS: All systems reviewed and negative except as per HPI.   Current Outpatient Prescriptions  Medication Sig Dispense Refill  . Bilberry 60 MG CAPS Take by mouth daily.      . Coenzyme Q10-Vitamin E 100-300 MG-UNIT WAFR Take  by mouth daily.      . CRESTOR 20 MG tablet TAKE 1 TABLET AT BEDTIME  90 tablet  2  . fish oil-omega-3 fatty acids 1000 MG capsule Take 3 g by mouth daily.      . multivitamin (THERAGRAN) per tablet Take 1 tablet by mouth daily.      . Probiotic Product (PROBIOTIC FORMULA PO) Take by mouth every other day.      . pyridOXINE (VITAMIN B-6) 100 MG tablet Take 100 mg by mouth daily.      . sildenafil (VIAGRA) 100 MG tablet Take 1 tablet (100 mg total) by mouth as needed.  10 tablet  6  . Zn-Pyg Afri-Nettle-Saw Palmet (SAW PALMETTO COMPLEX PO) Take by mouth daily.      [name] DISCONTD: metoprolol succinate (TOPROL-XL) 100 MG 24 hr tablet TAKE ONE AND ONE-HALF TABLETS ONCE DAILY  135 tablet  2  . dronedarone (MULTAQ) 400 MG tablet Take 1 tablet (400 mg total) by mouth 2 (two) times daily with a meal.  60 tablet  1  . metoprolol (TOPROL XL) 200 MG 24 hr tablet Pt is taking two 100mg  tablets      . warfarin (COUMADIN) 5 MG tablet 1 daily or as directed  30 tablet  0    BP 144/86  Pulse 100  Ht 5\' 8"  (1.727 m)  Wt 200 lb (90.719 kg)  BMI 30.41 kg/m2 General: NAD Neck: No JVD,  no thyromegaly or thyroid nodule.  Lungs: Clear to auscultation bilaterally with normal respiratory effort. CV: Nondisplaced PMI.  Heart mildly tachycardic, irregular S1/S2, no S3/S4, 2/6 early SEM RUSB.  No peripheral edema.  No carotid bruit.  Normal pedal pulses.  Abdomen: Soft, nontender, no hepatosplenomegaly, no distention.  Neurologic: Alert and oriented x 3.  Psych: Normal affect. Extremities: No clubbing or cyanosis.   Assessment/Plan: 1. 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  Since then, he has had rare episodes of AT versus atypical atrial flutter. This current episode has been more persistent and seems to have started on Thursday.  He is symptomatic but not particularly uncomfortable.   - I will start him on coumadin.   If he remains out of rhythm after > 3 wks therapeutic INR, I will plan on cardioverting him.  If he becomes more symptomatic in the interim, TEE-guided DCCV would be an option.  He is not a candidate for NOAC medications given his valvular heart disease.  I may not continue him on long-term anticoagulation.  He has had trouble with coumadin in the past and has had his left atrial appendage ligated.   - Increase Toprol XL to 200 mg daily.  - I will add an antiarrhythmic.  He may be in and out of NSR and the antiarrhythmic may hold him in NSR without the need for DCCV.  I would avoid class Ic agents given his known structural heart disease (replaced MV and AoV).  His QTc is normal.  I will start dronedarone 400 mg bid to be taken with food (no CHF symptoms).  If this is unsuccessful, dofetilide would be the next option but would require admission.  - Echocardiogram for LV and valvular function.  - Stop ASA when INR is therapeutic.  2. 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  As above, I will get an echocardiogram.  3. Hyperlipidemia: Lipids acceptable in 1/13.   [name] 12/12/2011 1:43 PM

## 2011-12-12 NOTE — Telephone Encounter (Signed)
New Problem:    Patient called in wanting to be seen today because he is having heart rhythm anomalies.  Please call back.

## 2011-12-12 NOTE — Patient Instructions (Addendum)
Start Multaq 400mg  two times a day. Take this with food.  Start warfarin(coumadin) 5mg  daily.  Increase Toprol XL to 200mg  daily. You can take two 100mg  tablets daily at the same time.  Schedule an appointment in the [facility](coumadin) Clinic Thursday October 10,2013. You will need weekly INRs for a cardioversion.  Discontinue aspirin when your INR is therapeutic.  Your physician has requested that you have an echocardiogram. Echocardiography is a painless test that uses sound waves to create images of your heart. It provides your doctor with information about the size and shape of your heart and how well your heart's chambers and valves are working. This procedure takes approximately one hour. There are no restrictions for this procedure.  Your physician recommends that you schedule a follow-up appointment in: 2 weeks with Dr Shirlee Latch.

## 2011-12-15 ENCOUNTER — Ambulatory Visit (INDEPENDENT_AMBULATORY_CARE_PROVIDER_SITE_OTHER): Payer: Medicare Other | Admitting: *Deleted

## 2011-12-15 DIAGNOSIS — I4891 Unspecified atrial fibrillation: Secondary | ICD-10-CM | POA: Diagnosis not present

## 2011-12-15 DIAGNOSIS — Z9889 Other specified postprocedural states: Secondary | ICD-10-CM | POA: Diagnosis not present

## 2011-12-15 DIAGNOSIS — Z7901 Long term (current) use of anticoagulants: Secondary | ICD-10-CM

## 2011-12-15 DIAGNOSIS — I080 Rheumatic disorders of both mitral and aortic valves: Secondary | ICD-10-CM | POA: Diagnosis not present

## 2011-12-15 LAB — POCT INR: INR: 1.2

## 2011-12-15 NOTE — Patient Instructions (Signed)

## 2011-12-20 ENCOUNTER — Ambulatory Visit (HOSPITAL_COMMUNITY): Payer: Medicare Other | Attending: Cardiovascular Disease | Admitting: Radiology

## 2011-12-20 ENCOUNTER — Ambulatory Visit (INDEPENDENT_AMBULATORY_CARE_PROVIDER_SITE_OTHER): Payer: Medicare Other | Admitting: Pharmacist

## 2011-12-20 ENCOUNTER — Other Ambulatory Visit: Payer: Self-pay

## 2011-12-20 DIAGNOSIS — Z7901 Long term (current) use of anticoagulants: Secondary | ICD-10-CM | POA: Diagnosis not present

## 2011-12-20 DIAGNOSIS — E119 Type 2 diabetes mellitus without complications: Secondary | ICD-10-CM | POA: Insufficient documentation

## 2011-12-20 DIAGNOSIS — I379 Nonrheumatic pulmonary valve disorder, unspecified: Secondary | ICD-10-CM | POA: Diagnosis not present

## 2011-12-20 DIAGNOSIS — I059 Rheumatic mitral valve disease, unspecified: Secondary | ICD-10-CM

## 2011-12-20 DIAGNOSIS — Z9889 Other specified postprocedural states: Secondary | ICD-10-CM

## 2011-12-20 DIAGNOSIS — I4891 Unspecified atrial fibrillation: Secondary | ICD-10-CM | POA: Insufficient documentation

## 2011-12-20 DIAGNOSIS — I10 Essential (primary) hypertension: Secondary | ICD-10-CM | POA: Diagnosis not present

## 2011-12-20 DIAGNOSIS — I369 Nonrheumatic tricuspid valve disorder, unspecified: Secondary | ICD-10-CM | POA: Diagnosis not present

## 2011-12-20 DIAGNOSIS — I080 Rheumatic disorders of both mitral and aortic valves: Secondary | ICD-10-CM

## 2011-12-20 DIAGNOSIS — E785 Hyperlipidemia, unspecified: Secondary | ICD-10-CM

## 2011-12-20 DIAGNOSIS — I359 Nonrheumatic aortic valve disorder, unspecified: Secondary | ICD-10-CM

## 2011-12-20 NOTE — Progress Notes (Signed)
Echocardiogram performed.  

## 2011-12-26 ENCOUNTER — Telehealth: Payer: Self-pay

## 2011-12-26 NOTE — Telephone Encounter (Signed)
Pt called requesting MD advisement on with he is a candidate for "high strength" flu vaccination (4 strain). Please advise.

## 2011-12-27 NOTE — Telephone Encounter (Signed)
Pt advised and will call back to schedule nurse visit for flu vaccination.

## 2011-12-27 NOTE — Telephone Encounter (Signed)
Fine to take either trivalent or quadravalent flu vaccine

## 2011-12-28 ENCOUNTER — Ambulatory Visit: Payer: Medicare Other | Admitting: Cardiology

## 2011-12-29 ENCOUNTER — Ambulatory Visit (INDEPENDENT_AMBULATORY_CARE_PROVIDER_SITE_OTHER): Payer: Medicare Other | Admitting: Cardiology

## 2011-12-29 ENCOUNTER — Encounter: Payer: Self-pay | Admitting: Cardiology

## 2011-12-29 ENCOUNTER — Ambulatory Visit (INDEPENDENT_AMBULATORY_CARE_PROVIDER_SITE_OTHER): Payer: Medicare Other | Admitting: *Deleted

## 2011-12-29 VITALS — BP 142/88 | HR 64 | Ht 68.0 in | Wt 199.8 lb

## 2011-12-29 DIAGNOSIS — I080 Rheumatic disorders of both mitral and aortic valves: Secondary | ICD-10-CM | POA: Diagnosis not present

## 2011-12-29 DIAGNOSIS — Z7901 Long term (current) use of anticoagulants: Secondary | ICD-10-CM | POA: Diagnosis not present

## 2011-12-29 DIAGNOSIS — I4891 Unspecified atrial fibrillation: Secondary | ICD-10-CM | POA: Diagnosis not present

## 2011-12-29 DIAGNOSIS — I359 Nonrheumatic aortic valve disorder, unspecified: Secondary | ICD-10-CM | POA: Diagnosis not present

## 2011-12-29 DIAGNOSIS — Z9889 Other specified postprocedural states: Secondary | ICD-10-CM

## 2011-12-29 LAB — POCT INR: INR: 2.3

## 2011-12-29 MED ORDER — ROSUVASTATIN CALCIUM 20 MG PO TABS: 20.0000 mg | ORAL_TABLET | Freq: Every day | ORAL | Status: DC

## 2011-12-29 MED ORDER — METOPROLOL SUCCINATE ER 100 MG PO TB24: ORAL_TABLET | ORAL | Status: DC

## 2011-12-29 MED ORDER — DRONEDARONE HCL 400 MG PO TABS: 400.0000 mg | ORAL_TABLET | Freq: Two times a day (BID) | ORAL | Status: DC

## 2011-12-29 NOTE — Progress Notes (Signed)
Patient ID: [name] [name], male   DOB: [birth year], 71 y.o.   MRN: [id number] PCP: Dr. 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  Earlier in 10/13, he developed palpitations and was found to have atypical atrial flutter on ECG during last office visit.  I started him on dronedarone and coumadin with plan for cardioversion after 4 wks of therapeutic anticoagulation if he did not convert back to NSR on his own.  He feels like he has been back in NSR for over 2 weeks now with no further palpitations.  He is in NSR today.  Only complaint is that he does not have as much energy since starting dronedarone and increasing Toprol XL.  No dyspnea or chest pain.  Echo this month showed EF 55-60% with normal bioprosthetic mitral and aortic valves.    ECG (12/12/11): Atypical atrial flutter versus atrial tachycardia with rate around 100 bpm, QTc normal.  ECG: (12/29/11): NSR, 1st degree AV block PR 242 msec.                                                                                                                                            PMH: 1. Atrial fibrillation: s/p MAZE and LAA ligation in 2006.  No documented atrial fibrillation since operation.  2. Atrial tachycardia 3. 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  Last echo here in 5/11 with EF 60-65%, normal bioprosthetic aortic valve, normal bioprosthetic mitral valve, moderate LAE.  Echo 11/12 at [facility] looked ok per report. Echo (10/13) with EF 55-60%, mild LVH, normal bioprosthetic aortic and mitral valves, moderate LAE, PA systolic pressure 35 mmHg.  4. Hyperlipidemia 5. L-spine surgery 6. LHC with no significant coronary disease in 2006 pre-op.  7. Atypical atrial flutter 10/13.  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  Never smoked.   FH: No premature CAD.   ROS: All systems reviewed and negative except as per HPI.   Current Outpatient Prescriptions  Medication Sig Dispense Refill  . Bilberry 60 MG CAPS Take by mouth daily.      . Coenzyme Q10-Vitamin E 100-300 MG-UNIT WAFR Take by mouth daily.      [name] dronedarone (MULTAQ) 400 MG tablet Take 1 tablet (400 mg total) by mouth 2 (two) times daily with a meal.  180 tablet  3  . fish oil-omega-3 fatty acids 1000 MG capsule Take 3 g by mouth daily.      . metoprolol succinate (TOPROL-XL) 100 MG 24 hr tablet 1 and 1/2 tablets daily  135 tablet  3  . multivitamin (THERAGRAN) per tablet Take 1 tablet by mouth daily.      . Probiotic  Product (PROBIOTIC FORMULA PO) Take by mouth every other day.      . pyridOXINE (VITAMIN B-6) 100 MG tablet Take 100 mg by mouth daily.      . rosuvastatin (CRESTOR) 20 MG tablet Take 1 tablet (20 mg total) by mouth daily.  90 tablet  3  . sildenafil (VIAGRA) 100 MG tablet Take 1 tablet (100 mg total) by mouth as needed.  10 tablet  6  . warfarin (COUMADIN) 5 MG tablet 1 daily or as directed  30 tablet  0  . Zn-Pyg Afri-Nettle-Saw Palmet (SAW PALMETTO COMPLEX PO) Take by mouth daily.      [name] DISCONTD: CRESTOR 20 MG tablet TAKE 1 TABLET AT BEDTIME  90 tablet  2  . DISCONTD: dronedarone (MULTAQ) 400 MG tablet Take 1 tablet (400 mg total) by mouth 2 (two) times daily with a meal.  60 tablet  1  . DISCONTD: metoprolol (TOPROL XL) 200 MG 24 hr tablet Pt is taking two 100mg  tablets      . DISCONTD: metoprolol succinate (TOPROL XL) 100 MG 24 hr tablet 1 and 1/2 tablets daily  45 tablet  11    BP 142/88  Pulse 64  Ht 5\' 8"  (1.727 m)  Wt 199 lb 12.8 oz (90.629 kg)  BMI 30.38 kg/m2 General: NAD Neck: No JVD, no thyromegaly or thyroid nodule.  Lungs: Clear to auscultation bilaterally with normal respiratory effort. CV: Nondisplaced PMI.  Heart mildly tachycardic, irregular S1/S2, no S3/S4, 2/6 early SEM RUSB.  No  peripheral edema.  No carotid bruit.  Normal pedal pulses.  Abdomen: Soft, nontender, no hepatosplenomegaly, no distention.  Neurologic: Alert and oriented x 3.  Psych: Normal affect. Extremities: No clubbing or cyanosis.   Assessment/Plan: 1. Atypical atrial flutter: Patient had a MAZE procedure with LAA ligation at the time of his valve replacement.  Since then, he has had rare episodes of AT versus atypical atrial flutter. The episode this month was more persistent than in the past.  At last appointment, I started him on coumadin in case we needed to cardiovert him to get him back in NSR. He is not a candidate for NOAC medications given his valvular heart disease.  For now, I am going to decrease his INR goal to 2-2.5 given history of severe epistaxis with coumadin.  I will need to review literature on risk of CVA after surgical LAA ligation.  Continue dronedarone to maintain NSR.  I will cut back on Toprol XL to 150 mg daily (prior dose) given increased fatigue.  2. 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  Valves looked normal on recent echo.   [name] [name] 12/29/2011 11:33 PM

## 2011-12-29 NOTE — Patient Instructions (Addendum)
Decrease your Metoprolol succinate to 150mg  daily  ( this will be 1 and 1/2 tablets)  Follow-up in 2 months with Dr. Shirlee Latch  Goal INR is 2-2.5

## 2012-01-05 ENCOUNTER — Other Ambulatory Visit: Payer: Self-pay | Admitting: Cardiology

## 2012-01-10 ENCOUNTER — Ambulatory Visit (INDEPENDENT_AMBULATORY_CARE_PROVIDER_SITE_OTHER): Payer: Medicare Other | Admitting: *Deleted

## 2012-01-10 ENCOUNTER — Ambulatory Visit: Payer: Medicare Other

## 2012-01-10 DIAGNOSIS — Z7901 Long term (current) use of anticoagulants: Secondary | ICD-10-CM

## 2012-01-10 DIAGNOSIS — I4891 Unspecified atrial fibrillation: Secondary | ICD-10-CM | POA: Diagnosis not present

## 2012-01-10 DIAGNOSIS — Z9889 Other specified postprocedural states: Secondary | ICD-10-CM | POA: Diagnosis not present

## 2012-01-10 DIAGNOSIS — I080 Rheumatic disorders of both mitral and aortic valves: Secondary | ICD-10-CM | POA: Diagnosis not present

## 2012-01-10 LAB — POCT INR: INR: 2.6

## 2012-01-17 ENCOUNTER — Ambulatory Visit (INDEPENDENT_AMBULATORY_CARE_PROVIDER_SITE_OTHER): Payer: Medicare Other | Admitting: *Deleted

## 2012-01-17 DIAGNOSIS — Z7901 Long term (current) use of anticoagulants: Secondary | ICD-10-CM

## 2012-01-17 DIAGNOSIS — I080 Rheumatic disorders of both mitral and aortic valves: Secondary | ICD-10-CM | POA: Diagnosis not present

## 2012-01-17 DIAGNOSIS — Z9889 Other specified postprocedural states: Secondary | ICD-10-CM

## 2012-01-17 DIAGNOSIS — I4891 Unspecified atrial fibrillation: Secondary | ICD-10-CM | POA: Diagnosis not present

## 2012-01-17 LAB — POCT INR: INR: 2.6

## 2012-01-31 ENCOUNTER — Encounter: Payer: Self-pay | Admitting: Internal Medicine

## 2012-01-31 ENCOUNTER — Ambulatory Visit (INDEPENDENT_AMBULATORY_CARE_PROVIDER_SITE_OTHER): Payer: Medicare Other | Admitting: Internal Medicine

## 2012-01-31 ENCOUNTER — Ambulatory Visit (INDEPENDENT_AMBULATORY_CARE_PROVIDER_SITE_OTHER): Payer: Medicare Other | Admitting: *Deleted

## 2012-01-31 VITALS — BP 138/84 | HR 77 | Temp 97.6°F | Resp 12 | Wt 198.0 lb

## 2012-01-31 DIAGNOSIS — I4891 Unspecified atrial fibrillation: Secondary | ICD-10-CM | POA: Diagnosis not present

## 2012-01-31 DIAGNOSIS — I080 Rheumatic disorders of both mitral and aortic valves: Secondary | ICD-10-CM

## 2012-01-31 DIAGNOSIS — Z9889 Other specified postprocedural states: Secondary | ICD-10-CM

## 2012-01-31 DIAGNOSIS — Z7901 Long term (current) use of anticoagulants: Secondary | ICD-10-CM | POA: Diagnosis not present

## 2012-01-31 DIAGNOSIS — J069 Acute upper respiratory infection, unspecified: Secondary | ICD-10-CM | POA: Diagnosis not present

## 2012-01-31 LAB — POCT INR: INR: 1.9

## 2012-01-31 NOTE — Progress Notes (Signed)
Patient ID: [name] [name], male   DOB: 1941-02-26, 71 y.o.   MRN: [id number]  HPI [name] [name] is a very pleasant 71 yo gentleman who presents with cough. The cough has been present for 2 weeks. He thinks he caught something from his grandchildren, who were diagnosed with walking pneumonia (1 granddaughter) and bronchitis (1 granddaughter + daughter in law) after spending a few days with the patient. At the time of interaction they had runny noses. His head feels congestive. It feels productive on the level of the sinuses, heavy mucous, but he does not feel like it is coming from his chest or lungs. He has had uncontrollable coughing fits over the last 2 weeks.  He has been taking Mucinex and it seems to help. He has not tried anything else. The coughing seems worse when lying down. Sitting up seems to help.   ROS -Denies fever, chills, itchy eyes, headache, palpitations +Cough, congestion +Clammy feeling +Irritative throat  Past Medical History  Diagnosis Date  . Psychosexual dysfunction with inhibited sexual excitement   . Other and unspecified hyperlipidemia   . Lesion of ulnar nerve   . Lesion of ulnar nerve   . External hemorrhoids without mention of complication   . Acute rheumatic pericarditis   . Acute rheumatic heart disease, unspecified     childhood, age  36 & 10  . 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 Atrial fibrillation     history   History reviewed. No pertinent past surgical history. Family History  Problem Relation Age of Onset  . Stroke Mother   . Vision loss Mother     macular degeneration  . Cancer Father     intestinal/GI  . Diabetes Paternal Aunt    History   Social History  . Marital Status: Married    Spouse Name: N/A    Number of Children: N/A  . Years of Education: N/A   Occupational History  . Not on file.   Social History Main Topics  . Smoking status: Never Smoker   . Smokeless tobacco: Never Used  . Alcohol Use: Yes   Comment: wine occasionally  . Drug Use: No  . Sexually Active: Yes -- Male partner(s)   Other Topics Concern  . Not on file   Social History Narrative   [name] [name] - BS, [place]. married - '67. 2 sons - '74, '77  one son is a Oceanographer for [employer] - [employer]; 4 grandchildren. 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 marriage in good health. End-of-life: discussed issues and provided packet with the MOST form and out of facility order.   Current Outpatient Prescriptions on File Prior to Visit  Medication Sig Dispense Refill  . Bilberry 60 MG CAPS Take by mouth daily.      . Coenzyme Q10-Vitamin E 100-300 MG-UNIT WAFR Take by mouth daily.      [name] dronedarone (MULTAQ) 400 MG tablet Take 1 tablet (400 mg total) by mouth 2 (two) times daily with a meal.  180 tablet  3  . fish oil-omega-3 fatty acids 1000 MG capsule Take 3 g by mouth daily.      . metoprolol succinate (TOPROL-XL) 100 MG 24 hr tablet 1 and 1/2 tablets daily  135 tablet  3  . multivitamin (THERAGRAN) per tablet Take 1 tablet by mouth daily.      . Probiotic Product (PROBIOTIC FORMULA PO) Take by mouth every other  day.      . pyridOXINE (VITAMIN B-6) 100 MG tablet Take 100 mg by mouth daily.      . rosuvastatin (CRESTOR) 20 MG tablet Take 1 tablet (20 mg total) by mouth daily.  90 tablet  3  . sildenafil (VIAGRA) 100 MG tablet Take 1 tablet (100 mg total) by mouth as needed.  10 tablet  6  . warfarin (COUMADIN) 5 MG tablet TAKE 1 TABLET DAILY OR AS DIRECTED  40 tablet  3  . Zn-Pyg Afri-Nettle-Saw Palmet (SAW PALMETTO COMPLEX PO) Take by mouth daily.       PE General: Pleasant man in NAD HEENT: Pupils round and reactive, ear canals clear, oral mucosa moist, oropharynx clear. Nontender sinuses. No lymphadenopathy.  CV: Regular rate and rhythm, S1 and S2 present. Pulse regular. Pulm: Lungs clear to auscultation in upper and lower lobes. Neuro: Alert and  appropriate. No focal deficits.  A/P  [name] is a 71 yo gentleman who presents with cough and congestion.  # Cough and Congestion - likely consistent with upper respiratory illness acquired from sick contacts without evidence of a bacterial infection. Afebrile, stable. 2 weeks duration. -Mucinex 1200mg  twice per day -Cough syrup over the counter -Claritin, Syrtec, Allegra -Hydrate -Call back for purulent drainage, fever, increased pain or sinus tenderness.   Patient interviewed and examined. Agree with assessment and plan per Ms. Tyrell Seifer, MS III

## 2012-01-31 NOTE — Patient Instructions (Addendum)
Thank you for coming to see [place]. Your symptoms are consistent with an upper respiratory viral infection.  Recommendations: 1. Mucinex 1200mg  twice per day 2. Cough syrup of choice 3. Claritin, Syrtec, Allegra 4. Hydrate, hydrate, hydrate!

## 2012-02-07 DIAGNOSIS — D239 Other benign neoplasm of skin, unspecified: Secondary | ICD-10-CM | POA: Diagnosis not present

## 2012-02-07 DIAGNOSIS — D1801 Hemangioma of skin and subcutaneous tissue: Secondary | ICD-10-CM | POA: Diagnosis not present

## 2012-02-07 DIAGNOSIS — L819 Disorder of pigmentation, unspecified: Secondary | ICD-10-CM | POA: Diagnosis not present

## 2012-02-07 DIAGNOSIS — D2320 Other benign neoplasm of skin of unspecified ear and external auricular canal: Secondary | ICD-10-CM | POA: Diagnosis not present

## 2012-02-07 DIAGNOSIS — L578 Other skin changes due to chronic exposure to nonionizing radiation: Secondary | ICD-10-CM | POA: Diagnosis not present

## 2012-02-22 ENCOUNTER — Ambulatory Visit (INDEPENDENT_AMBULATORY_CARE_PROVIDER_SITE_OTHER): Payer: Medicare Other | Admitting: Cardiology

## 2012-02-22 ENCOUNTER — Encounter: Payer: Self-pay | Admitting: Cardiology

## 2012-02-22 ENCOUNTER — Encounter (INDEPENDENT_AMBULATORY_CARE_PROVIDER_SITE_OTHER): Payer: Medicare Other | Admitting: *Deleted

## 2012-02-22 ENCOUNTER — Ambulatory Visit: Payer: Self-pay | Admitting: Cardiology

## 2012-02-22 VITALS — BP 124/72 | HR 66 | Ht 68.0 in | Wt 200.0 lb

## 2012-02-22 DIAGNOSIS — Z9889 Other specified postprocedural states: Secondary | ICD-10-CM | POA: Diagnosis not present

## 2012-02-22 DIAGNOSIS — R0989 Other specified symptoms and signs involving the circulatory and respiratory systems: Secondary | ICD-10-CM

## 2012-02-22 DIAGNOSIS — I359 Nonrheumatic aortic valve disorder, unspecified: Secondary | ICD-10-CM | POA: Diagnosis not present

## 2012-02-22 DIAGNOSIS — I4891 Unspecified atrial fibrillation: Secondary | ICD-10-CM

## 2012-02-22 DIAGNOSIS — I080 Rheumatic disorders of both mitral and aortic valves: Secondary | ICD-10-CM

## 2012-02-22 DIAGNOSIS — Z7901 Long term (current) use of anticoagulants: Secondary | ICD-10-CM

## 2012-02-22 MED ORDER — METOPROLOL SUCCINATE ER 100 MG PO TB24: 100.0000 mg | ORAL_TABLET | Freq: Every day | ORAL | Status: DC

## 2012-02-22 MED ORDER — DRONEDARONE HCL 400 MG PO TABS: 400.0000 mg | ORAL_TABLET | Freq: Two times a day (BID) | ORAL | Status: DC

## 2012-02-22 MED ORDER — ROSUVASTATIN CALCIUM 20 MG PO TABS: 20.0000 mg | ORAL_TABLET | Freq: Every day | ORAL | Status: DC

## 2012-02-22 NOTE — Patient Instructions (Addendum)
Decrease metoprolol succinate (Toprol XL) to 100mg  daily.  Stop coumadin.   Start aspirin 325mg  daily on Friday.   Your physician wants you to follow-up in: 4 months with Dr Shirlee Latch. (April 2014).  You will receive a reminder letter in the mail two months in advance. If you don't receive a letter, please call our office to schedule the follow-up appointment.

## 2012-02-23 NOTE — Progress Notes (Signed)
Patient ID: [name] [name], male   DOB: 11-23-1940, 71 y.o.   MRN: [id number] PCP: Dr. 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  I started him on dronedarone and coumadin with plan for cardioversion after 4 wks of therapeutic anticoagulation if he did not convert back to NSR on his own.  He went back into NSR spontaneously and feels like he has been in NSR since.  He is in NSR by ECG today.  No dyspnea or chest pain.  He feels like his energy level is down somewhat.   ECG (12/12/11): Atypical atrial flutter versus atrial tachycardia with rate around 100 bpm, QTc normal.  ECG (12/29/11): NSR, 1st degree AV block PR 242 msec.    ECG (12/13): NSR, 1st degree AV block PR 244 msec                                                                                                                                         PMH: 1. Atrial fibrillation: s/p MAZE and LAA ligation in 2006.  No documented atrial fibrillation since operation.  2. Atrial tachycardia/atypical atrial flutter 3. 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  Last echo here in 5/11 with EF 60-65%, normal bioprosthetic aortic valve, normal bioprosthetic mitral valve, moderate LAE.  Echo 11/12 at [facility] looked ok per report. Echo (10/13) with EF 55-60%, mild LVH, normal bioprosthetic aortic and mitral valves, moderate LAE, PA systolic pressure 35 mmHg.  4. Hyperlipidemia 5. L-spine surgery 6. LHC with no significant coronary disease in 2006 pre-op.   SH: Married, retired Pensions consultant [name] [name], [place], [place]).  2 sons.  Never smoked.   FH: No premature CAD.   ROS: All systems reviewed and negative except as per HPI.   Current Outpatient  Prescriptions  Medication Sig Dispense Refill  . amoxicillin (AMOXIL) 500 MG capsule       . Bilberry 60 MG CAPS Take by mouth daily.      . Coenzyme Q10-Vitamin E 100-300 MG-UNIT WAFR Take by mouth daily.      [name] dronedarone (MULTAQ) 400 MG tablet Take 1 tablet (400 mg total) by mouth 2 (two) times daily with a meal.  180 tablet  3  . fish oil-omega-3 fatty acids 1000 MG capsule Take 3 g by mouth daily.      . multivitamin (THERAGRAN) per tablet Take 1 tablet by mouth daily.      . Probiotic Product (PROBIOTIC FORMULA PO) Take by mouth every other day.      . pyridOXINE (VITAMIN B-6) 100 MG tablet Take 100 mg by mouth daily.      [name]  rosuvastatin (CRESTOR) 20 MG tablet Take 1 tablet (20 mg total) by mouth daily.  90 tablet  3  . sildenafil (VIAGRA) 100 MG tablet Take 1 tablet (100 mg total) by mouth as needed.  10 tablet  6  . Zn-Pyg Afri-Nettle-Saw Palmet (SAW PALMETTO COMPLEX PO) Take by mouth daily.      . metoprolol succinate (TOPROL-XL) 100 MG 24 hr tablet Take 1 tablet (100 mg total) by mouth daily. Take with or immediately following a meal.  90 tablet  3    BP 124/72  Pulse 66  Ht 5\' 8"  (1.727 m)  Wt 200 lb (90.719 kg)  BMI 30.41 kg/m2 General: NAD Neck: No JVD, no thyromegaly or thyroid nodule.  Lungs: Clear to auscultation bilaterally with normal respiratory effort. CV: Nondisplaced PMI.  Heart mildly tachycardic, regular S1/S2, no S3/S4, 2/6 early SEM RUSB.  No peripheral edema.  No carotid bruit.  Normal pedal pulses.  Abdomen: Soft, nontender, no hepatosplenomegaly, no distention.  Neurologic: Alert and oriented x 3.  Psych: Normal affect. Extremities: No clubbing or cyanosis.   Assessment/Plan: 1. Atypical atrial flutter: Patient had a MAZE procedure with LAA ligation at the time of his valve replacement.  Since then, he has had rare episodes of AT versus atypical atrial flutter. The episode in 10/13 was more persistent than in the past.  He converted back to NSR on  dronedarone and remains in NSR.  At a prior appointment, I started him on coumadin in case we needed to cardiovert him to get him back in NSR. He is not a candidate for NOAC medications given his valvular heart disease.  I reviewed the literature on LAA ligation.  There is not a lot of data available but it does seem to be protective from stroke.  As he is staying in NSR, I will let him stop coumadin at this time and go back on aspirin.  Continue dronedarone to maintain NSR.  I will cut back on Toprol XL to 100 mg daily given increased fatigue.  2. 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  Valves looked normal on recent echo.   [name] 02/23/2012 11:14 PM

## 2012-02-28 ENCOUNTER — Encounter: Payer: Self-pay | Admitting: Internal Medicine

## 2012-02-28 ENCOUNTER — Ambulatory Visit (INDEPENDENT_AMBULATORY_CARE_PROVIDER_SITE_OTHER): Payer: Medicare Other | Admitting: Internal Medicine

## 2012-02-28 VITALS — BP 112/68 | HR 96 | Temp 99.2°F

## 2012-02-28 DIAGNOSIS — J209 Acute bronchitis, unspecified: Secondary | ICD-10-CM

## 2012-02-28 MED ORDER — DOXYCYCLINE HYCLATE 100 MG PO TABS: 100.0000 mg | ORAL_TABLET | Freq: Two times a day (BID) | ORAL | Status: DC

## 2012-02-28 NOTE — Patient Instructions (Addendum)
Acute Bronchitis You have acute bronchitis. This means you have a chest cold. The airways in your lungs are red and sore (inflamed). Acute means it is sudden onset.  CAUSES Bronchitis is most often caused by the same virus that causes a cold. SYMPTOMS   Body aches.  Chest congestion.  Chills.  Cough.  Fever.  Shortness of breath.  Sore throat. TREATMENT  Acute bronchitis is usually treated with rest, fluids, and medicines for relief of fever or cough. Most symptoms should go away after a few days or a week. Increased fluids may help thin your secretions and will prevent dehydration. Your caregiver may give you an inhaler to improve your symptoms. The inhaler reduces shortness of breath and helps control cough. You can take over-the-counter pain relievers or cough medicine to decrease coughing, pain, or fever. A cool-air vaporizer may help thin bronchial secretions and make it easier to clear your chest. Antibiotics are usually not needed but can be prescribed if you smoke, are seriously ill, have chronic lung problems, are elderly, or you are at higher risk for developing complications.Allergies and asthma can make bronchitis worse. Repeated episodes of bronchitis may cause longstanding lung problems. Avoid smoking and secondhand smoke.Exposure to cigarette smoke or irritating chemicals will make bronchitis worse. If you are a cigarette smoker, consider using nicotine gum or skin patches to help control withdrawal symptoms. Quitting smoking will help your lungs heal faster. Recovery from bronchitis is often slow, but you should start feeling better after 2 to 3 days. Cough from bronchitis frequently lasts for 3 to 4 weeks. To prevent another bout of acute bronchitis:  Quit smoking.  Wash your hands frequently to get rid of viruses or use a hand sanitizer.  Avoid other people with cold or virus symptoms.  Try not to touch your hands to your mouth, nose, or eyes. SEEK IMMEDIATE  MEDICAL CARE IF:  You develop increased fever, chills, or chest pain.  You have severe shortness of breath or bloody sputum.  You develop dehydration, fainting, repeated vomiting, or a severe headache.  You have no improvement after 1 week of treatment or you get worse. MAKE SURE YOU:   Understand these instructions.  Will watch your condition.  Will get help right away if you are not doing well or get worse. Document Released: 03/31/2004 Document Revised: 05/16/2011 Document Reviewed: 06/16/2010 ExitCare Patient Information 2013 [employer].  

## 2012-02-28 NOTE — Progress Notes (Signed)
HPI  Pt presents to the clinic today with c/o cough, chest congestion, fever, chills and body aches. This started 1 week ago. He was recently seen in 01/2012 for a viral URI. That never really got better and has now progressed down into his chest. He has taken Mucinex and OTC cough syrup. He has been trying to stay well hydrated. He did have 1 episode of diarrhea this morning but took some imodium. He has not had sick contacts that he knows of. He denies any history of asthma or allergies.  Review of Systems    Past Medical History  Diagnosis Date  . Psychosexual dysfunction with inhibited sexual excitement   . Other and unspecified hyperlipidemia   . Lesion of ulnar nerve   . Lesion of ulnar nerve   . External hemorrhoids without mention of complication   . Acute rheumatic pericarditis   . Acute rheumatic heart disease, unspecified     childhood, age  86 & 10  . 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 Atrial fibrillation     history    Family History  Problem Relation Age of Onset  . Stroke Mother   . Vision loss Mother     macular degeneration  . Cancer Father     intestinal/GI  . Diabetes Paternal Aunt     History   Social History  . Marital Status: Married    Spouse Name: N/A    Number of Children: N/A  . Years of Education: N/A   Occupational History  . Not on file.   Social History Main Topics  . Smoking status: Never Smoker   . Smokeless tobacco: Never Used  . Alcohol Use: Yes     Comment: wine occasionally  . Drug Use: No  . Sexually Active: Yes -- Male partner(s)   Other Topics Concern  . Not on file   Social History Narrative   [name] - BS, [place]. married - '67. 2 sons - '74, '77  one son is a Oceanographer for [employer] - [employer]; 4 grandchildren. 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 marriage in good health. End-of-life: discussed issues and provided packet with the  MOST form and out of facility order.    Allergies  Allergen Reactions  . Naproxen     REACTION: Hives     Constitutional: Positive headache, fatigue and fever. Denies abrupt weight changes.  HEENT:  Positive  nasal congestion and sore throat. Denies eye redness,eye pain, pressure behind the eyes, facial pain, ear pain, ringing in the ears, wax buildup, runny nose or bloody nose. Respiratory: Positive cough and thick green sputum production. Denies difficulty breathing or shortness of breath.  Cardiovascular: Denies chest pain, chest tightness, palpitations or swelling in the hands or feet.   No other specific complaints in a complete review of systems (except as listed in HPI above).  Objective:    General: Appears his stated age, well developed, well nourished in NAD. HEENT: Head: normal shape and size; Eyes: sclera white, no icterus, conjunctiva pink, PERRLA and EOMs intact; Ears: Tm's gray and intact, normal light reflex; Nose: mucosa pink and moist, septum midline; Throat/Mouth: + PND. Teeth present, mucosa pink and moist, no exudate noted, no lesions or ulcerations noted.  Neck: Mild cervical lymphadenopathy. Neck supple, trachea midline. No massses, lumps or thyromegaly present.  Cardiovascular: Normal rate and rhythm. S1,S2 noted.  No murmur, rubs or gallops noted. No  JVD or BLE edema. No carotid bruits noted. Pulmonary/Chest: Normal effort and intermiitent inspiratory wheezing and fine rhonchi noted in the bases. No respiratory distress.      Assessment & Plan:   Acute bronchitis  Continue to take Mucinex and drink plenty of fluids Tylenol as needed for fever/body aches Doxycycline x 10 days  RTC as needed or if symptoms persist.

## 2012-03-02 NOTE — Addendum Note (Signed)
Addended by: Williemae Area on: 03/02/2012 01:18 PM   Modules accepted: Orders

## 2012-04-26 DIAGNOSIS — H2589 Other age-related cataract: Secondary | ICD-10-CM | POA: Diagnosis not present

## 2012-04-26 DIAGNOSIS — H40019 Open angle with borderline findings, low risk, unspecified eye: Secondary | ICD-10-CM | POA: Diagnosis not present

## 2012-04-27 ENCOUNTER — Encounter: Payer: Self-pay | Admitting: Gastroenterology

## 2012-05-15 ENCOUNTER — Telehealth: Payer: Self-pay | Admitting: Cardiology

## 2012-05-15 NOTE — Telephone Encounter (Signed)
Pt calling re heart rate running low, fatigue pls advise started about 2 days ago

## 2012-05-15 NOTE — Telephone Encounter (Signed)
Appt made to see Wende Mott on 05/16/2012.

## 2012-05-16 ENCOUNTER — Encounter: Payer: Self-pay | Admitting: Physician Assistant

## 2012-05-16 ENCOUNTER — Ambulatory Visit (INDEPENDENT_AMBULATORY_CARE_PROVIDER_SITE_OTHER): Payer: Medicare Other | Admitting: Physician Assistant

## 2012-05-16 VITALS — BP 140/80 | HR 62 | Ht 68.0 in | Wt 195.0 lb

## 2012-05-16 DIAGNOSIS — R5383 Other fatigue: Secondary | ICD-10-CM

## 2012-05-16 DIAGNOSIS — I498 Other specified cardiac arrhythmias: Secondary | ICD-10-CM

## 2012-05-16 DIAGNOSIS — I4891 Unspecified atrial fibrillation: Secondary | ICD-10-CM

## 2012-05-16 DIAGNOSIS — R5381 Other malaise: Secondary | ICD-10-CM | POA: Diagnosis not present

## 2012-05-16 DIAGNOSIS — E785 Hyperlipidemia, unspecified: Secondary | ICD-10-CM

## 2012-05-16 DIAGNOSIS — I359 Nonrheumatic aortic valve disorder, unspecified: Secondary | ICD-10-CM

## 2012-05-16 DIAGNOSIS — R001 Bradycardia, unspecified: Secondary | ICD-10-CM

## 2012-05-16 MED ORDER — METOPROLOL SUCCINATE ER 50 MG PO TB24: 50.0000 mg | ORAL_TABLET | Freq: Every day | ORAL | Status: DC

## 2012-05-16 NOTE — Patient Instructions (Addendum)
KEEP APPT WITH DR. Shirlee Latch 06/06/12  DECREASE TOPROL XL TO 50 MG DAILY; NEW RX SENT IN FOR THE 50 MG TAB  LABS 05/17/12; BMET, CBC W/DIFF, TSH, LFT, LIPID

## 2012-05-16 NOTE — Progress Notes (Signed)
[address], [address] [place], [place]  [address] Phone: [phone], Fax:  [phone]  Date:  05/16/2012   ID:  Hershy, Flenner 1941-01-06, MRN [id number]  PCP:  Illene Regulus, MD  Primary Cardiologist:  Dr. [name]     History of Present Illness: [name] [name] is a 72 y.o. male who returns for the evaluation of bradycardia.  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  He was started on dronedarone and coumadin with plans for cardioversion after 4 wks of therapeutic anticoagulation if he did not convert back to NSR on his own. He went back into NSR spontaneously. Ultimately came off of coumadin as he has undergone LAA ligation at the time of his surgery.  Lately he has felt lethargic and with decrease "stamina."  Notes his HR is in the 50s quite often.  He feels like he has to take deeper breaths with activity but denies significant DOE.  No chest pain.  No orthopnea, PND, edema.  No syncope.    Labs (1/13):  K 4.4, creatinine 0.9, ALT 36, Hgb 14.5, TSH 2.13  Wt Readings from Last 3 Encounters:  05/16/12 195 lb (88.451 kg)  02/22/12 200 lb (90.719 kg)  01/31/12 198 lb (89.812 kg)     Past Medical History: 1. Atrial fibrillation: s/p MAZE and LAA ligation in 2006. No documented atrial fibrillation since operation.  2. Atrial tachycardia/atypical atrial flutter  3. 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 Last echo here in 5/11 with EF 60-65%, normal bioprosthetic aortic valve, normal bioprosthetic mitral valve, moderate LAE. Echo 11/12 at [facility] looked ok per report. Echo (10/13) with EF 55-60%, mild LVH, normal bioprosthetic aortic and mitral valves, moderate LAE, PA systolic pressure 35 mmHg.  4.  Hyperlipidemia  5. L-spine surgery  6. LHC with no significant coronary disease in 2006 pre-op.   Current Outpatient Prescriptions  Medication Sig Dispense Refill  . Bilberry 60 MG CAPS Take by mouth daily.      . Coenzyme Q10-Vitamin E 100-300 MG-UNIT WAFR Take by mouth daily.      [name] dronedarone (MULTAQ) 400 MG tablet Take 1 tablet (400 mg total) by mouth 2 (two) times daily with a meal.  180 tablet  3  . fish oil-omega-3 fatty acids 1000 MG capsule Take 3 g by mouth daily.      . metoprolol succinate (TOPROL-XL) 100 MG 24 hr tablet Take 1 tablet (100 mg total) by mouth daily. Take with or immediately following a meal.  90 tablet  3  . multivitamin (THERAGRAN) per tablet Take 1 tablet by mouth daily.      . Probiotic Product (PROBIOTIC FORMULA PO) Take by mouth every other day.      . pyridOXINE (VITAMIN B-6) 100 MG tablet Take 100 mg by mouth daily.      . rosuvastatin (CRESTOR) 20 MG tablet Take 1 tablet (20 mg total) by mouth daily.  90 tablet  3  . sildenafil (VIAGRA) 100 MG tablet Take 1 tablet (100 mg total) by mouth as needed.  10 tablet  6  . Zn-Pyg Afri-Nettle-Saw Palmet (SAW PALMETTO COMPLEX PO) Take by mouth daily.       No current facility-administered medications for  this visit.    Allergies:    Allergies  Allergen Reactions  . Naproxen     REACTION: Hives    Social History:  The patient  reports that he has never smoked. He has never used smokeless tobacco. He reports that  drinks alcohol. He reports that he does not use illicit drugs.   ROS:  Please see the history of present illness.   Lost 5 lbs with dieting.   All other systems reviewed and negative.   PHYSICAL EXAM: VS:  BP 140/80  Pulse 62  Ht 5\' 8"  (1.727 m)  Wt 195 lb (88.451 kg)  BMI 29.66 kg/m2 Well nourished, well developed, in no acute distress HEENT: normal Neck: no JVD Cardiac:  normal S1, S2; RRR; 2/6 systolic murmur LSB Lungs:  clear to auscultation bilaterally, no wheezing, rhonchi or  rales Abd: soft, nontender, no hepatomegaly Ext: no edema Skin: warm and dry Neuro:  CNs 2-12 intact, no focal abnormalities noted  EKG:  NSR, HR 62, normal axis, 1st degree AVB, PR 226 ms, no change from prior     ASSESSMENT AND PLAN:  1. Bradycardia:  Suspect he is symptomatic from this.  Recommend we not change Dronedarone as he remains in NSR.  Decrease Toprol to 50 mg QD to see if this makes him feel better.  Keep follow up as planned with Dr. [name].  Check BMET, CBC, TSH, LFTs. 2. Atrial Flutter:  Remains in NSR.  Continue Dronedarone.   3. Rheumatic Heart Disease:  Last echo demonstrated well functioning bioprosthetic aortic and mitral valves. 4. Hypertension:  BP elevated today but he had soup last night.  Continue to monitor for now. 5. Hyperlipidemia:  Repeat Lipids. 6. Disposition:  Keep follow up next month with Dr. [name].  Luna Glasgow, PA-C  12:09 PM 05/16/2012

## 2012-05-17 ENCOUNTER — Other Ambulatory Visit (INDEPENDENT_AMBULATORY_CARE_PROVIDER_SITE_OTHER): Payer: Medicare Other

## 2012-05-17 ENCOUNTER — Telehealth: Payer: Self-pay | Admitting: *Deleted

## 2012-05-17 DIAGNOSIS — R5381 Other malaise: Secondary | ICD-10-CM | POA: Diagnosis not present

## 2012-05-17 DIAGNOSIS — I4891 Unspecified atrial fibrillation: Secondary | ICD-10-CM | POA: Diagnosis not present

## 2012-05-17 DIAGNOSIS — E785 Hyperlipidemia, unspecified: Secondary | ICD-10-CM

## 2012-05-17 DIAGNOSIS — R5383 Other fatigue: Secondary | ICD-10-CM

## 2012-05-17 LAB — BASIC METABOLIC PANEL
BUN: 21 mg/dL (ref 6–23)
CO2: 24 mEq/L (ref 19–32)
Chloride: 107 mEq/L (ref 96–112)
GFR: 81.42 mL/min (ref >60.00)
Glucose, Bld: 100 mg/dL — ABNORMAL HIGH (ref 70–99)
Potassium: 3.9 mEq/L (ref 3.5–5.1)

## 2012-05-17 LAB — CBC WITH DIFFERENTIAL/PLATELET
Eosinophils Relative: 3 % (ref 0.0–5.0)
HCT: 38.8 % — ABNORMAL LOW (ref 39.0–52.0)
Lymphs Abs: 2 10*3/uL (ref 0.7–4.0)
MCV: 92.4 fl (ref 78.0–100.0)
Monocytes Absolute: 0.7 10*3/uL (ref 0.1–1.0)
Platelets: 228 10*3/uL (ref 150.0–400.0)
RDW: 14.7 % — ABNORMAL HIGH (ref 11.5–14.6)

## 2012-05-17 LAB — HEPATIC FUNCTION PANEL
ALT: 62 U/L — ABNORMAL HIGH (ref 0–53)
Albumin: 3.9 g/dL (ref 3.5–5.2)
Total Bilirubin: 1 mg/dL (ref 0.3–1.2)

## 2012-05-17 LAB — LIPID PANEL
Cholesterol: 119 mg/dL (ref 0–200)
LDL Cholesterol: 64 mg/dL (ref 0–99)
Triglycerides: 88 mg/dL (ref 0.0–149.0)

## 2012-05-17 NOTE — Telephone Encounter (Signed)
Message copied by Tarri Fuller on Thu May 17, 2012  3:17 PM ------      Message from: [place], [place] T      Created: Thu May 17, 2012  2:09 PM       K+, creatinine, LFTs, lipids, Hgb ok.      Sugar borderline elevated.  Recommend he keep an eye on this with his PCP.      Continue with current treatment plan.      Tereso Newcomer, PA-C  2:08 PM 05/17/2012 ------

## 2012-05-17 NOTE — Telephone Encounter (Signed)
lmom labs ok except slightly elevated glucose and to f/u w/PCP for this

## 2012-06-05 ENCOUNTER — Encounter: Payer: Medicare Other | Admitting: Internal Medicine

## 2012-06-06 ENCOUNTER — Ambulatory Visit: Payer: Medicare Other | Admitting: Cardiology

## 2012-06-19 ENCOUNTER — Ambulatory Visit (INDEPENDENT_AMBULATORY_CARE_PROVIDER_SITE_OTHER): Payer: Medicare Other | Admitting: Cardiology

## 2012-06-19 ENCOUNTER — Encounter: Payer: Self-pay | Admitting: Cardiology

## 2012-06-19 VITALS — BP 142/64 | HR 68 | Ht 68.0 in | Wt 197.8 lb

## 2012-06-19 DIAGNOSIS — I019 Acute rheumatic heart disease, unspecified: Secondary | ICD-10-CM | POA: Diagnosis not present

## 2012-06-19 DIAGNOSIS — I471 Supraventricular tachycardia: Secondary | ICD-10-CM

## 2012-06-19 DIAGNOSIS — I10 Essential (primary) hypertension: Secondary | ICD-10-CM | POA: Diagnosis not present

## 2012-06-19 NOTE — Progress Notes (Signed)
Patient ID: [name] [name], male   DOB: 1940/06/07, 72 y.o.   MRN: [id number] PCP: Dr. 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  I started him on dronedarone and coumadin with plan for cardioversion after 4 wks of therapeutic anticoagulation if he did not convert back to NSR on his own.  He went back into NSR spontaneously and feels like he has been in NSR since.  He is in NSR today.  No dyspnea or chest pain.    He saw Tereso Newcomer in the office a couple of months ago with complaint of fatigue.  Toprol XL was decreased to 50 mg daily, and he says that he has been feeling better.  No chest pain.  Mild exertional dyspnea when walking up a hill but otherwise has been doing fine.  BP is running a bit high at 142/64.   ECG (12/12/11): Atypical atrial flutter versus atrial tachycardia with rate around 100 bpm, QTc normal.  ECG (12/29/11): NSR, 1st degree AV block PR 242 msec.    ECG (12/13): NSR, 1st degree AV block PR 244 msec     Labs (3/14): LDL 64, TSH normal, K 3.9, creatinine 1.0                                                                                                                                      PMH: 1. Atrial fibrillation: s/p MAZE and LAA ligation in 2006.  No documented atrial fibrillation since operation.  2. Atrial tachycardia/atypical atrial flutter 3. 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  Last echo here in 5/11 with EF 60-65%, normal bioprosthetic aortic valve, normal bioprosthetic mitral valve, moderate LAE.  Echo 11/12 at [facility] looked ok per report. Echo (10/13) with EF 55-60%, mild LVH, normal bioprosthetic aortic and mitral valves, moderate LAE, PA systolic pressure 35  mmHg.  4. Hyperlipidemia 5. L-spine surgery 6. LHC with no significant coronary disease in 2006 pre-op.   SH: Married, retired Pensions consultant [name] [name], [place], [place]).  2 sons.  Never smoked.   FH: No premature CAD.   ROS: All systems reviewed and negative except as per HPI.   Current Outpatient Prescriptions  Medication Sig Dispense Refill  . aspirin 325 MG tablet Take 325 mg by mouth daily.      . Bilberry 60 MG CAPS Take by mouth daily.      . Coenzyme Q10-Vitamin E 100-300 MG-UNIT WAFR Take by mouth daily.      [name] dronedarone (MULTAQ) 400 MG tablet Take 1 tablet (400 mg total) by mouth 2 (two) times daily with a meal.  180 tablet  3  .  fish oil-omega-3 fatty acids 1000 MG capsule Take 3 g by mouth daily.      . metoprolol succinate (TOPROL-XL) 50 MG 24 hr tablet Take 1 tablet (50 mg total) by mouth daily.    3  . multivitamin (THERAGRAN) per tablet Take 1 tablet by mouth daily.      . Probiotic Product (PROBIOTIC FORMULA PO) Take by mouth every other day.      . pyridOXINE (VITAMIN B-6) 100 MG tablet Take 100 mg by mouth daily.      . rosuvastatin (CRESTOR) 20 MG tablet Take 1 tablet (20 mg total) by mouth daily.  90 tablet  3  . sildenafil (VIAGRA) 100 MG tablet Take 1 tablet (100 mg total) by mouth as needed.  10 tablet  6  . Zn-Pyg Afri-Nettle-Saw Palmet (SAW PALMETTO COMPLEX PO) Take by mouth daily.       No current facility-administered medications for this visit.    BP 142/64  Pulse 68  Ht 5\' 8"  (1.727 m)  Wt 197 lb 12.8 oz (89.721 kg)  BMI 30.08 kg/m2  SpO2 96% General: NAD Neck: No JVD, no thyromegaly or thyroid nodule.  Lungs: Clear to auscultation bilaterally with normal respiratory effort. CV: Nondisplaced PMI.  Heart mildly tachycardic, regular S1/S2, no S3/S4, 2/6 early SEM RUSB.  No peripheral edema.  No carotid bruit.  Normal pedal pulses.  Abdomen: Soft, nontender, no hepatosplenomegaly, no distention.  Neurologic: Alert and oriented x 3.  Psych: Normal  affect. Extremities: No clubbing or cyanosis.   Assessment/Plan: 1. Atypical atrial flutter: Patient had a MAZE procedure with LAA ligation at the time of his valve replacement.  Since then, he has had rare episodes of AT versus atypical atrial flutter. The episode in 10/13 was more persistent than in the past.  He converted back to NSR on dronedarone and remains in NSR.  At a prior appointment, I started him on coumadin in case we needed to cardiovert him to get him back in NSR. He is not a candidate for NOAC medications given his valvular heart disease.  I reviewed the literature on LAA ligation.  There is not a lot of data available but it does seem to be protective from stroke.  I let him stop coumadin, and he is back on aspirin.  Continue dronedarone to maintain NSR.  Continue Toprol XL at 50 mg daily, he seems to be tolerating this dose.    2. 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  Valves looked normal on 10/13 echo.  Will repeat echo in 10/15.  3. HTN: BP elevated today (recently decreased Toprol XL).  I will have him get a BP cuff and check BP daily at home for the next 2 wks.  We will call to see what his BP is running. If consistently elevated, would add ACEI.   [name] 06/19/2012 9:10 AM

## 2012-06-19 NOTE — Patient Instructions (Addendum)
Your physician wants you to follow-up in: 6 MONTHS WITH DR [facility] You will receive a reminder letter in the mail two months in advance. If you don't receive a letter, please call our office to schedule the follow-up appointment.   MONITOR BLOOD PRESSURE ONCE DAILY FOR TWO WEEKS

## 2012-07-09 ENCOUNTER — Telehealth: Payer: Self-pay | Admitting: *Deleted

## 2012-07-09 NOTE — Telephone Encounter (Signed)
Pt advised.

## 2012-07-09 NOTE — Telephone Encounter (Signed)
Those are ok

## 2012-07-09 NOTE — Telephone Encounter (Signed)
06/19/12 HTN: BP elevated today (recently decreased Toprol XL). I will have him get a BP cuff and check BP daily at home for the next 2 wks. We will call to see what his BP is running. If consistently elevated, would add ACEI.  07/09/12 Spoke with patient--BP readings 4-5 days after office visit were in the 140/80 range. Recent BP readings better--07/03/12 128/78    07/04/12  127/ 74 ,115/68    07/05/12 123/75  07/07/12 136/81. Pt will continue to check BP a couple of times a week. I will forward to Dr Shirlee Latch for review.

## 2012-07-13 ENCOUNTER — Encounter: Payer: Self-pay | Admitting: Gastroenterology

## 2012-07-31 ENCOUNTER — Ambulatory Visit (AMBULATORY_SURGERY_CENTER): Payer: Medicare Other

## 2012-07-31 VITALS — Ht 68.0 in | Wt 203.4 lb

## 2012-07-31 DIAGNOSIS — Z1211 Encounter for screening for malignant neoplasm of colon: Secondary | ICD-10-CM

## 2012-07-31 DIAGNOSIS — Z86010 Personal history of colonic polyps: Secondary | ICD-10-CM

## 2012-07-31 MED ORDER — MOVIPREP 100 G PO SOLR: ORAL | Status: DC

## 2012-08-01 ENCOUNTER — Encounter: Payer: Self-pay | Admitting: Internal Medicine

## 2012-08-01 ENCOUNTER — Ambulatory Visit (INDEPENDENT_AMBULATORY_CARE_PROVIDER_SITE_OTHER): Payer: Medicare Other | Admitting: Internal Medicine

## 2012-08-01 VITALS — BP 138/80 | HR 71 | Temp 98.5°F | Ht 68.0 in | Wt 201.4 lb

## 2012-08-01 DIAGNOSIS — E785 Hyperlipidemia, unspecified: Secondary | ICD-10-CM

## 2012-08-01 DIAGNOSIS — I4891 Unspecified atrial fibrillation: Secondary | ICD-10-CM

## 2012-08-01 DIAGNOSIS — Z0000 Encounter for general adult medical examination without abnormal findings: Secondary | ICD-10-CM | POA: Diagnosis not present

## 2012-08-01 MED ORDER — SILDENAFIL CITRATE 100 MG PO TABS: 100.0000 mg | ORAL_TABLET | ORAL | Status: DC | PRN

## 2012-08-01 NOTE — Progress Notes (Signed)
Patient ID: [name] [name], male   DOB: October 06, 1940, 72 y.o.   MRN: [id number]  The patient is here for annual Medicare wellness examination and management of other chronic and acute problems.  Interval history since last annual exam he has been to [place] cut short by the illness of his mother in Social worker. He did develop Atrial fibrillation - he increased toprol and saw Dr. Shirlee Latch on his return to the [place], who put him on Multaq and reduced the dose of beta blocker. With valvular disease he was not a candidate for NOAC. He has held sinus rhythm for several months and is on ASA full dose.   He is enjoying the grandchildren   The risk factors are reflected in the social history.  The roster of all physicians providing medical care to patient - is listed in the Snapshot section of the chart.  Activities of daily living:  The patient is 100% inedpendent in all ADLs: dressing, toileting, feeding as well as independent mobility  Home safety : The patient has smoke detectors in the home. Falls - none. Home is fall safe - grab bar. They wear seatbelts. firearms are present in the home, kept in a safe fashion. There is no violence in the home.   There is no risks for hepatitis, STDs or HIV. There is no history of blood transfusion. They have no travel history to infectious disease endemic areas of the world.  The patient has seen their dentist in the last six month. They have seen their eye doctor in the last year. They deny any hearing difficulty and have not had audiologic testing in the last year.  They do not  have excessive sun exposure. Discussed the need for sun protection: hats, long sleeves and use of sunscreen if there is significant sun exposure.   Diet: the importance of a healthy diet is discussed. They do have a healthy (unhealthy-high fat/fast food) diet.  The patient has a regular exercise program: weight training, aerobics ,  120 min duration, 3_per week.  The benefits of regular aerobic exercise  were discussed.  Depression screen: there are no signs or vegative symptoms of depression- irritability, change in appetite, anhedonia, sadness/tearfullness.  Cognitive assessment: the patient manages all their financial and personal affairs and is actively engaged.   The following portions of the patient's history were reviewed and updated as appropriate: allergies, current medications, past family history, past medical history,  past surgical history, past social history  and problem list.  Vision, hearing, body mass index were assessed and reviewed.   During the course of the visit the patient was educated and counseled about appropriate screening and preventive services including : fall prevention , diabetes screening, nutrition counseling, colorectal cancer screening, and recommended immunizations.  Past Medical History  Diagnosis Date  . Psychosexual dysfunction with inhibited sexual excitement   . Other and unspecified hyperlipidemia   . 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 Lesion of ulnar nerve   . External hemorrhoids without mention of complication   . Acute rheumatic pericarditis   . Acute rheumatic heart disease, unspecified     childhood, age  3 & 10  . 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 Atrial fibrillation     history  . Diverticulosis    Past Surgical History  Procedure Laterality Date  . Laminectomies      10/1975 and in 03/2005  . Aortic and mitral valve replacement  09/2004  . Cardioversion      3 times from 2004-2006  . Tonsillectomy      1950  . Colonoscopy     Family History  Problem Relation Age of Onset  . Stroke Mother   . Vision loss Mother     macular degeneration  . Cancer Father     intestinal/GI  . Diabetes Paternal Aunt    History   Social History  . Marital Status: Married    Spouse Name: N/A    Number of Children: N/A  . Years of Education: N/A   Occupational History  . Not on file.   Social  History Main Topics  . Smoking status: Never Smoker   . Smokeless tobacco: Never Used  . Alcohol Use: 4.2 oz/week    7 Glasses of wine per week     Comment: wine occasionally  . Drug Use: No  . Sexually Active: Yes -- Male partner(s)   Other Topics Concern  . Not on file   Social History Narrative   [name] - BS, [place]. married - '67. 2 sons - '74, '77  one son is a Oceanographer for [employer] - [employer]; 4 grandchildren. 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 marriage in good health. End-of-life: discussed issues and provided packet with the MOST form and out of facility order.    Current Outpatient Prescriptions on File Prior to Visit  Medication Sig Dispense Refill  . aspirin 325 MG tablet Take 325 mg by mouth daily.      . Bilberry 60 MG CAPS Take by mouth daily.      . Coenzyme Q10-Vitamin E 100-300 MG-UNIT WAFR Take by mouth daily.      [name] dronedarone (MULTAQ) 400 MG tablet Take 1 tablet (400 mg total) by mouth 2 (two) times daily with a meal.  180 tablet  3  . fish oil-omega-3 fatty acids 1000 MG capsule Take 3 g by mouth daily.      [name] loratadine (CLARITIN) 10 MG tablet Take 10 mg by mouth as needed for allergies.      . metoprolol succinate (TOPROL-XL) 50 MG 24 hr tablet Take 1 tablet (50 mg total) by mouth daily.    3  . MOVIPREP 100 G SOLR Moviprep as directed/ no substitutions  1 kit  0  . multivitamin (THERAGRAN) per tablet Take 1 tablet by mouth daily.      . Probiotic Product (PROBIOTIC FORMULA PO) Take by mouth every other day.      . pyridOXINE (VITAMIN B-6) 100 MG tablet Take 100 mg by mouth daily.      . rosuvastatin (CRESTOR) 20 MG tablet Take 1 tablet (20 mg total) by mouth daily.  90 tablet  3  . sildenafil (VIAGRA) 100 MG tablet Take 1 tablet (100 mg total) by mouth as needed.  10 tablet  6  . Zn-Pyg Afri-Nettle-Saw Palmet (SAW PALMETTO COMPLEX PO) Take by mouth daily.       No current  facility-administered medications on file prior to visit.     Filed Vitals:   08/01/12 1343  BP: 138/80  Pulse: 71  Temp: 98.5 F (36.9 C)   Wt Readings from Last 3 Encounters:  08/01/12 201 lb 6.4 oz (91.354 kg)  07/31/12 203 lb 6.4 oz (92.262 kg)  06/19/12 197 lb 12.8 oz (89.721 kg)   Gen'l: Well nourished well developed white male in no acute distress  HEENT:  Head: Normocephalic and atraumatic. Right Ear: External ear normal. EAC/TM nl. Left Ear: External ear normal.  EAC/TM nl. Nose: Nose normal. Mouth/Throat: Oropharynx is clear and moist. Dentition - native, in good repair. No buccal or palatal lesions. Posterior pharynx clear. Eyes: Conjunctivae and sclera clear. EOM intact. Pupils are equal, round, and reactive to light. Right eye exhibits no discharge. Left eye exhibits no discharge. Neck: Normal range of motion. Neck supple. No JVD present. No tracheal deviation present. No thyromegaly present.  Cardiovascular: Normal rate, regular rhythm, no gallop, no friction rub, no murmur heard.      Quiet precordium. 2+ radial and DP pulses . No carotid bruits Pulmonary/Chest: Effort normal. No respiratory distress or increased WOB, no wheezes, no rales. No chest wall deformity or CVAT. Abdomen: Soft. Bowel sounds are normal in all quadrants. He exhibits no distension, no tenderness, no rebound or guarding, No heptosplenomegaly  Genitourinary:  deferred Musculoskeletal: Normal range of motion. He exhibits no edema and no tenderness.       Small and large joints without redness, synovial thickening or deformity. Full range of motion preserved about all small, median and large joints.  Lymphadenopathy:    He has no cervical or supraclavicular adenopathy.  Neurological: He is alert and oriented to person, place, and time. CN II-XII intact. DTRs 2+ and symmetrical biceps, radial and patellar tendons. Cerebellar function normal with no tremor, rigidity, normal gait and station.  Skin: Skin is  warm and dry. No rash noted. No erythema.  Psychiatric: He has a normal mood and affect. His behavior is normal. Thought content normal.   Recent Results (from the past 2160 hour(s))  CBC WITH DIFFERENTIAL     Status: Abnormal   Collection Time    05/17/12 10:37 AM      Result Value Range   WBC 8.9  4.5 - 10.5 K/uL   RBC 4.21 (*) 4.22 - 5.81 Mil/uL   Hemoglobin 13.2  13.0 - 17.0 g/dL   HCT 16.1 (*) 09.6 - 04.5 %   MCV 92.4  78.0 - 100.0 fl   MCHC 34.0  30.0 - 36.0 g/dL   RDW 40.9 (*) 81.1 - 91.4 %   Platelets 228.0  150.0 - 400.0 K/uL   Neutrophils Relative % 66.0  43.0 - 77.0 %   Lymphocytes Relative 22.3  12.0 - 46.0 %   Monocytes Relative 8.0  3.0 - 12.0 %   Eosinophils Relative 3.0  0.0 - 5.0 %   Basophils Relative 0.7  0.0 - 3.0 %   Neutro Abs 5.9  1.4 - 7.7 K/uL   Lymphs Abs 2.0  0.7 - 4.0 K/uL   Monocytes Absolute 0.7  0.1 - 1.0 K/uL   Eosinophils Absolute 0.3  0.0 - 0.7 K/uL   Basophils Absolute 0.1  0.0 - 0.1 K/uL  HEPATIC FUNCTION PANEL     Status: Abnormal   Collection Time    05/17/12 10:37 AM      Result Value Range   Total Bilirubin 1.0  0.3 - 1.2 mg/dL   Bilirubin, Direct 0.1  0.0 - 0.3 mg/dL   Alkaline Phosphatase 62  39 - 117 U/L   AST 36  0 - 37 U/L   ALT 62 (*) 0 - 53 U/L   Total Protein 7.0  6.0 - 8.3 g/dL   Albumin 3.9  3.5 - 5.2 g/dL  LIPID PANEL     Status: Abnormal   Collection Time    05/17/12 10:37 AM  Result Value Range   Cholesterol 119  0 - 200 mg/dL   Comment: ATP III Classification       Desirable:  < 200 mg/dL               Borderline High:  200 - 239 mg/dL          High:  > = 161 mg/dL   Triglycerides 09.6  0.0 - 149.0 mg/dL   Comment: Normal:  <045 mg/dLBorderline High:  150 - 199 mg/dL   HDL 40.98 (*) >11.91 mg/dL   VLDL 47.8  0.0 - 29.5 mg/dL   LDL Cholesterol 64  0 - 99 mg/dL   Total CHOL/HDL Ratio 3     Comment:                Men          Women1/2 Average Risk     3.4          3.3Average Risk          5.0          4.42X  Average Risk          9.6          7.13X Average Risk          15.0          11.0                      TSH     Status: None   Collection Time    05/17/12 10:37 AM      Result Value Range   TSH 1.08  0.35 - 5.50 uIU/mL  BASIC METABOLIC PANEL     Status: Abnormal   Collection Time    05/17/12 10:37 AM      Result Value Range   Sodium 139  135 - 145 mEq/L   Potassium 3.9  3.5 - 5.1 mEq/L   Chloride 107  96 - 112 mEq/L   CO2 24  19 - 32 mEq/L   Glucose, Bld 100 (*) 70 - 99 mg/dL   BUN 21  6 - 23 mg/dL   Creatinine, Ser 1.0  0.4 - 1.5 mg/dL   Calcium 8.6  8.4 - 62.1 mg/dL   GFR 30.86  >57.84 mL/min

## 2012-08-01 NOTE — Patient Instructions (Signed)
Thanks for coming in -----you are doing great (48 sec guarantee)

## 2012-08-02 ENCOUNTER — Telehealth: Payer: Self-pay | Admitting: Cardiology

## 2012-08-02 NOTE — Telephone Encounter (Signed)
New Problem  He has a colonscopy scheduled at GI & he is instructed for 2 days to having nothing but clear liquids. His multaq must be taken with food. He wants to know how to manage this?

## 2012-08-02 NOTE — Telephone Encounter (Signed)
I will forward to Dr Shirlee Latch for recommendations

## 2012-08-02 NOTE — Telephone Encounter (Signed)
Reviewed with Dr McLean 

## 2012-08-02 NOTE — Telephone Encounter (Signed)
Dr Shirlee Latch recommended continue multaq as prescribed, drinking fluids when he took the multaq.

## 2012-08-05 NOTE — Assessment & Plan Note (Signed)
Interval history notable for recurrent a. Fib otherwise unremarkable. He has been feeling well. Physical exam is normal. Previous labs reviewed - in normal range. He is current with colorectal cancer screening and has aged out of prostate screening. Immunizations are up to date.  In summary - a very nice man who is doing well and appears medically stable. He will return in 1 year or sooner as needed.

## 2012-08-05 NOTE — Assessment & Plan Note (Signed)
Tolerating statin therapy without adverse effects. LDL is well below goal; HDL is a goal.  Plan Continue present regimen

## 2012-08-05 NOTE — Assessment & Plan Note (Signed)
Had a relapse of atrial fibrillation that was medically managed and he has been holding sinus rhythm. Currently treated with Multaq.  Plan Per cardiology - he may possibly come off medication at his next cardiology visit.

## 2012-08-13 ENCOUNTER — Ambulatory Visit (AMBULATORY_SURGERY_CENTER): Payer: Medicare Other | Admitting: Gastroenterology

## 2012-08-13 ENCOUNTER — Encounter: Payer: Self-pay | Admitting: Gastroenterology

## 2012-08-13 VITALS — BP 126/79 | HR 57 | Temp 98.1°F | Resp 16 | Ht 68.0 in | Wt 203.0 lb

## 2012-08-13 DIAGNOSIS — K5730 Diverticulosis of large intestine without perforation or abscess without bleeding: Secondary | ICD-10-CM | POA: Diagnosis not present

## 2012-08-13 DIAGNOSIS — E669 Obesity, unspecified: Secondary | ICD-10-CM | POA: Diagnosis not present

## 2012-08-13 DIAGNOSIS — Z86010 Personal history of colonic polyps: Secondary | ICD-10-CM | POA: Diagnosis not present

## 2012-08-13 DIAGNOSIS — Z1211 Encounter for screening for malignant neoplasm of colon: Secondary | ICD-10-CM

## 2012-08-13 DIAGNOSIS — I4891 Unspecified atrial fibrillation: Secondary | ICD-10-CM | POA: Diagnosis not present

## 2012-08-13 MED ORDER — SODIUM CHLORIDE 0.9 % IV SOLN: 500.0000 mL | INTRAVENOUS | Status: DC

## 2012-08-13 NOTE — Progress Notes (Signed)
Procedure ends, to recovery, report given and VSS. 

## 2012-08-13 NOTE — Patient Instructions (Signed)
Diverticulosis seen today. Try to follow high fiber diet. Handouts given on diverticulosis and fiber diet. Repeat colonoscopy in 10 years. Resume current medications.  Call us with any questions or concerns. Thank you!!  YOU HAD AN ENDOSCOPIC PROCEDURE TODAY AT THE [facility]: Refer to the procedure report that was given to you for any specific questions about what was found during the examination.  If the procedure report does not answer your questions, please call your gastroenterologist to clarify.  If you requested that your care partner not be given the details of your procedure findings, then the procedure report has been included in a sealed envelope for you to review at your convenience later.  YOU SHOULD EXPECT: Some feelings of bloating in the abdomen. Passage of more gas than usual.  Walking can help get rid of the air that was put into your GI tract during the procedure and reduce the bloating. If you had a lower endoscopy (such as a colonoscopy or flexible sigmoidoscopy) you may notice spotting of blood in your stool or on the toilet paper. If you underwent a bowel prep for your procedure, then you may not have a normal bowel movement for a few days.  DIET: Your first meal following the procedure should be a light meal and then it is ok to progress to your normal diet.  A half-sandwich or bowl of soup is an example of a good first meal.  Heavy or fried foods are harder to digest and may make you feel nauseous or bloated.  Likewise meals heavy in dairy and vegetables can cause extra gas to form and this can also increase the bloating.  Drink plenty of fluids but you should avoid alcoholic beverages for 24 hours.  ACTIVITY: Your care partner should take you home directly after the procedure.  You should plan to take it easy, moving slowly for the rest of the day.  You can resume normal activity the day after the procedure however you should NOT DRIVE or use heavy machinery for 24  hours (because of the sedation medicines used during the test).    SYMPTOMS TO REPORT IMMEDIATELY: A gastroenterologist can be reached at any hour.  During normal business hours, 8:30 AM to 5:00 PM Monday through Friday, call [phone].  After hours and on weekends, please call the GI answering service at [phone] who will take a message and have the physician on call contact you.   Following lower endoscopy (colonoscopy or flexible sigmoidoscopy):  Excessive amounts of blood in the stool  Significant tenderness or worsening of abdominal pains  Swelling of the abdomen that is new, acute  Fever of 100F or higher  Following upper endoscopy (EGD)  Vomiting of blood or coffee ground material  New chest pain or pain under the shoulder blades  Painful or persistently difficult swallowing  New shortness of breath  Fever of 100F or higher  Black, tarry-looking stools  FOLLOW UP: If any biopsies were taken you will be contacted by phone or by letter within the next 1-3 weeks.  Call your gastroenterologist if you have not heard about the biopsies in 3 weeks.  Our staff will call the home number listed on your records the next business day following your procedure to check on you and address any questions or concerns that you may have at that time regarding the information given to you following your procedure. This is a courtesy call and so if there is no answer at the  home number and we have not heard from you through the emergency physician on call, we will assume that you have returned to your regular daily activities without incident.  SIGNATURES/CONFIDENTIALITY: You and/or your care partner have signed paperwork which will be entered into your electronic medical record.  These signatures attest to the fact that that the information above on your After Visit Summary has been reviewed and is understood.  Full responsibility of the confidentiality of this discharge information lies with  you and/or your care-partner.

## 2012-08-13 NOTE — Op Note (Signed)
[facility] [address] [place], [address]   COLONOSCOPY PROCEDURE REPORT  PATIENT: [name] [name], [name] [name]  MR#: [id number] BIRTHDATE: 16-Nov-1940 , 71  yrs. old GENDER: Male ENDOSCOPIST: Mardella Layman, MD, [facility] REFERRED BY: PROCEDURE DATE:  08/13/2012 PROCEDURE:   Colonoscopy, surveillance ASA CLASS:   Class II INDICATIONS:Patient's personal history of adenomatous colon polyps.  MEDICATIONS: Propofol (Diprivan) 170 mg IV  DESCRIPTION OF PROCEDURE:   After the risks and benefits and of the procedure were explained, informed consent was obtained.  A digital rectal exam revealed no abnormalities of the rectum.    The LB [id number]  endoscope was introduced through the anus and advanced to the    .  The quality of the prep was excellent, using MoviPrep .  The instrument was then slowly withdrawn as the colon was fully examined.     COLON FINDINGS: Moderate diverticulosis was noted in the descending colon and sigmoid colon.   The colon was otherwise normal.  There was no diverticulosis, inflammation, polyps or cancers unless previously stated.     Retroflexed views revealed no abnormalities. The scope was then withdrawn from the patient and the procedure completed.  COMPLICATIONS: There were no complications. ENDOSCOPIC IMPRESSION: 1.   Moderate diverticulosis was noted in the descending colon and sigmoid colon 2.   The colon was otherwise normal..no polyps noted.  RECOMMENDATIONS: 1.  High fiber diet 2.  Continue current colorectal screening recommendations for "routine risk" patients with a repeat colonoscopy in 10 years.   REPEAT EXAM:  IH:[id number] E Norins, MD  _______________________________ eSigned:  Mardella Layman, MD, [facility] 08/13/2012 1:54 PM

## 2012-08-13 NOTE — Progress Notes (Signed)
Patient did not experience any of the following events: a burn prior to discharge; a fall within the facility; wrong site/side/patient/procedure/implant event; or a hospital transfer or hospital admission upon discharge from the facility. (G8907) Patient did not have preoperative order for IV antibiotic SSI prophylaxis. (G8918)  

## 2012-08-14 ENCOUNTER — Telehealth: Payer: Self-pay

## 2012-08-14 ENCOUNTER — Telehealth: Payer: Self-pay | Admitting: *Deleted

## 2012-08-14 NOTE — Telephone Encounter (Signed)
  Follow up Call-  Call back number 08/13/2012  Post procedure Call Back phone  # [phone]  Permission to leave phone message Yes     Patient questions:  Do you have a fever, pain , or abdominal swelling? no Pain Score  0 *  Have you tolerated food without any problems? yes  Have you been able to return to your normal activities? yes  Do you have any questions about your discharge instructions: Diet   no Medications  no Follow up visit  no  Do you have questions or concerns about your Care? no  Actions: * If pain score is 4 or above: No action needed, pain <4.   

## 2012-08-14 NOTE — Telephone Encounter (Signed)
  Follow up Call-  Call back number 08/13/2012  Post procedure Call Back phone  # [phone]  Permission to leave phone message Yes     Patient questions:  Do you have a fever, pain , or abdominal swelling? no Pain Score  0 *  Have you tolerated food without any problems? yes  Have you been able to return to your normal activities? yes  Do you have any questions about your discharge instructions: Diet   no Medications  no Follow up visit  no  Do you have questions or concerns about your Care? no  Actions: * If pain score is 4 or above: No action needed, pain <4.

## 2012-12-05 DIAGNOSIS — Z23 Encounter for immunization: Secondary | ICD-10-CM | POA: Diagnosis not present

## 2012-12-26 ENCOUNTER — Encounter: Payer: Self-pay | Admitting: Cardiology

## 2012-12-26 ENCOUNTER — Ambulatory Visit (INDEPENDENT_AMBULATORY_CARE_PROVIDER_SITE_OTHER): Payer: Medicare Other | Admitting: Cardiology

## 2012-12-26 VITALS — BP 137/83 | HR 68 | Wt 207.0 lb

## 2012-12-26 DIAGNOSIS — I4891 Unspecified atrial fibrillation: Secondary | ICD-10-CM

## 2012-12-26 DIAGNOSIS — I10 Essential (primary) hypertension: Secondary | ICD-10-CM

## 2012-12-26 DIAGNOSIS — E785 Hyperlipidemia, unspecified: Secondary | ICD-10-CM | POA: Diagnosis not present

## 2012-12-26 DIAGNOSIS — Z9889 Other specified postprocedural states: Secondary | ICD-10-CM

## 2012-12-26 NOTE — Progress Notes (Signed)
Patient ID: [name] [name], male   DOB: Mar 12, 1940, 72 y.o.   MRN: [id number] PCP: Dr. 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  I started him on dronedarone and coumadin with plan for cardioversion after 4 wks of therapeutic anticoagulation if he did not convert back to NSR on his own.  He went back into NSR spontaneously and feels like he has been in NSR since.  He is in NSR today.  No dyspnea or chest pain.  He is significantly less fatigued since decreasing his beta blocker dose.  He rides his exercise bike 4-5 times a week.  No tachypalpitations or lightheadedness.     ECG (12/12/11): Atypical atrial flutter versus atrial tachycardia with rate around 100 bpm, QTc normal.  ECG (12/29/11): NSR, 1st degree AV block PR 242 msec.    ECG (12/13): NSR, 1st degree AV block PR 244 msec    ECG: (10/14): NSR, 1st degree AV block  Labs (3/14): LDL 64, TSH normal, K 3.9, creatinine 1.0                                                                                                                                      PMH: 1. Atrial fibrillation: s/p MAZE and LAA ligation in 2006.  No documented atrial fibrillation since operation.  2. Atrial tachycardia/atypical atrial flutter 3. 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  Last echo here in 5/11 with EF 60-65%, normal bioprosthetic aortic valve, normal bioprosthetic mitral valve, moderate LAE.  Echo 11/12 at [facility] looked ok per report. Echo (10/13) with EF 55-60%, mild LVH, normal bioprosthetic aortic and mitral valves, moderate LAE, PA systolic pressure 35 mmHg.  4. Hyperlipidemia 5. L-spine surgery 6. LHC with no significant coronary disease in 2006 pre-op.    SH: Married, retired Pensions consultant [name] [name], [name] [name], [name] [name]).  2 sons.  Never smoked.   FH: No premature CAD.   Current Outpatient Prescriptions  Medication Sig Dispense Refill  . aspirin 325 MG tablet Take 325 mg by mouth daily.      . Bilberry 60 MG CAPS Take by mouth daily.      . Coenzyme Q10-Vitamin E 100-300 MG-UNIT WAFR Take by mouth daily.      [name] dronedarone (MULTAQ) 400 MG tablet Take 1 tablet (400 mg total) by mouth 2 (two) times daily with a meal.  180 tablet  3  . fish oil-omega-3 fatty acids 1000 MG capsule Take 3 g by mouth daily.      [name] loratadine (CLARITIN) 10 MG tablet Take 10 mg by mouth as needed for allergies.      . metoprolol  succinate (TOPROL-XL) 50 MG 24 hr tablet Take 1 tablet (50 mg total) by mouth daily.    3  . multivitamin (THERAGRAN) per tablet Take 1 tablet by mouth daily.      . Probiotic Product (PROBIOTIC FORMULA PO) Take by mouth every other day.      . pyridOXINE (VITAMIN B-6) 100 MG tablet Take 100 mg by mouth daily.      . rosuvastatin (CRESTOR) 20 MG tablet Take 1 tablet (20 mg total) by mouth daily.  90 tablet  3  . sildenafil (VIAGRA) 100 MG tablet Take 1 tablet (100 mg total) by mouth as needed.  10 tablet  6  . Zn-Pyg Afri-Nettle-Saw Palmet (SAW PALMETTO COMPLEX PO) Take by mouth daily.       No current facility-administered medications for this visit.    BP 137/83  Pulse 68  Wt 93.895 kg (207 lb)  BMI 31.48 kg/m2 General: NAD Neck: No JVD, no thyromegaly or thyroid nodule.  Lungs: Clear to auscultation bilaterally with normal respiratory effort. CV: Nondisplaced PMI.  Heart mildly tachycardic, regular S1/S2, no S3/S4, 2/6 early SEM RUSB.  No peripheral edema.  No carotid bruit.  Normal pedal pulses.  Abdomen: Soft, nontender, no hepatosplenomegaly, no distention.  Neurologic: Alert and oriented x 3.  Psych: Normal affect. Extremities: No clubbing or cyanosis.   Assessment/Plan: 1. Atypical atrial flutter: Patient had a MAZE procedure  with LAA ligation at the time of his valve replacement.  Since then, he has had rare episodes of AT versus atypical atrial flutter. The episode in 10/13 was more persistent than in the past.  He converted back to NSR on dronedarone and remains in NSR.  At a prior appointment, I started him on coumadin in case we needed to cardiovert him to get him back in NSR. He is not a candidate for NOAC medications given his valvular heart disease.  I reviewed the literature on LAA ligation.  There is not a lot of data available but it does seem to be protective from stroke.  I let him stop coumadin, and he is back on aspirin.  Continue dronedarone to maintain NSR.  Continue Toprol XL at 50 mg daily, he seems to be tolerating this dose.    2. 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  Valves looked normal on 10/13 echo.  Will repeat echo in 10/15.  3. HTN: BP is reasonably controlled.  4. Hyperlipidemia: Patient is going to look into whether atorvastatin would be significantly less expensive for him than Crestor.  I would be ok with him changing if that is the case.   [name] [name] 12/26/2012 10:31 PM

## 2012-12-26 NOTE — Patient Instructions (Signed)
Your physician wants you to follow-up in: 6 months with Dr McLean. (April 2015). You will receive a reminder letter in the mail two months in advance. If you don't receive a letter, please call our office to schedule the follow-up appointment.  

## 2013-02-04 ENCOUNTER — Telehealth: Payer: Self-pay | Admitting: Cardiology

## 2013-02-04 ENCOUNTER — Other Ambulatory Visit: Payer: Self-pay | Admitting: *Deleted

## 2013-02-04 DIAGNOSIS — I4891 Unspecified atrial fibrillation: Secondary | ICD-10-CM

## 2013-02-04 DIAGNOSIS — E785 Hyperlipidemia, unspecified: Secondary | ICD-10-CM

## 2013-02-04 DIAGNOSIS — I10 Essential (primary) hypertension: Secondary | ICD-10-CM

## 2013-02-04 DIAGNOSIS — I359 Nonrheumatic aortic valve disorder, unspecified: Secondary | ICD-10-CM

## 2013-02-04 MED ORDER — DRONEDARONE HCL 400 MG PO TABS: 400.0000 mg | ORAL_TABLET | Freq: Two times a day (BID) | ORAL | Status: DC

## 2013-02-04 MED ORDER — ATORVASTATIN CALCIUM 80 MG PO TABS: 80.0000 mg | ORAL_TABLET | Freq: Every day | ORAL | Status: DC

## 2013-02-04 NOTE — Telephone Encounter (Signed)
Per Dr Shirlee Latch --pt can change from crestor 20mg  to atorvastatin 80mg  daily. Pt will continue his current supply of crestor (enough to last to mid- January )  then change to atorvastatin. He will call back to schedule fasting lipid/liver profile 2 months after he changes over to atorvastatin.

## 2013-02-04 NOTE — Telephone Encounter (Signed)
New Problem  Pt called -- Requests script change// He is requesting a Substitute Crestor to the generic form of  Lipitor.. Please assist

## 2013-02-11 DIAGNOSIS — D1801 Hemangioma of skin and subcutaneous tissue: Secondary | ICD-10-CM | POA: Diagnosis not present

## 2013-02-11 DIAGNOSIS — D239 Other benign neoplasm of skin, unspecified: Secondary | ICD-10-CM | POA: Diagnosis not present

## 2013-02-11 DIAGNOSIS — L821 Other seborrheic keratosis: Secondary | ICD-10-CM | POA: Diagnosis not present

## 2013-02-11 DIAGNOSIS — D2330 Other benign neoplasm of skin of unspecified part of face: Secondary | ICD-10-CM | POA: Diagnosis not present

## 2013-02-11 DIAGNOSIS — L570 Actinic keratosis: Secondary | ICD-10-CM | POA: Diagnosis not present

## 2013-03-08 ENCOUNTER — Telehealth: Payer: Self-pay | Admitting: Internal Medicine

## 2013-03-08 MED ORDER — OSELTAMIVIR PHOSPHATE 75 MG PO CAPS: 75.0000 mg | ORAL_CAPSULE | Freq: Every day | ORAL | Status: DC

## 2013-03-08 NOTE — Telephone Encounter (Signed)
Patient called stating he has been exposed to the flu and would like a prescription for Tamiflu  Please advise

## 2013-03-08 NOTE — Telephone Encounter (Signed)
Patient notified Tamiflu is being sent to [facility]

## 2013-03-08 NOTE — Telephone Encounter (Signed)
tamiflu 75 mg once a day for 10 days

## 2013-05-16 ENCOUNTER — Encounter: Payer: Self-pay | Admitting: Internal Medicine

## 2013-05-16 ENCOUNTER — Ambulatory Visit (INDEPENDENT_AMBULATORY_CARE_PROVIDER_SITE_OTHER): Payer: Medicare Other | Admitting: Internal Medicine

## 2013-05-16 VITALS — BP 140/86 | HR 74 | Temp 97.9°F | Wt 191.4 lb

## 2013-05-16 DIAGNOSIS — Z0000 Encounter for general adult medical examination without abnormal findings: Secondary | ICD-10-CM | POA: Diagnosis not present

## 2013-05-16 DIAGNOSIS — Z9889 Other specified postprocedural states: Secondary | ICD-10-CM | POA: Diagnosis not present

## 2013-05-16 DIAGNOSIS — F528 Other sexual dysfunction not due to a substance or known physiological condition: Secondary | ICD-10-CM | POA: Diagnosis not present

## 2013-05-16 NOTE — Progress Notes (Signed)
Subjective:    Patient ID: [name] [name], male    DOB: May 22, 1940, 73 y.o.   MRN: [id number]  HPI [name] [name] presents for routine follow up general medical problems. He reports he has been feeling well and doing well. He has had no major illness or  since his last visit and has followed with Dr. [name] for arrythmia. In June he did have a colonoscopy - moderate diverticulosis w/o polyps: f/u in 10 years.  ED - he reports that Viagra does not work as well as it used to. Discussed other drugs available and the lack of contra-indications.  Past Medical History  Diagnosis Date  . Psychosexual dysfunction with inhibited sexual excitement   . Other and unspecified hyperlipidemia   . 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 Lesion of ulnar nerve   . External hemorrhoids without mention of complication   . Acute rheumatic pericarditis   . Acute rheumatic heart disease, unspecified     childhood, age  78 & 39  . 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 Atrial fibrillation     history  . Diverticulosis    Past Surgical History  Procedure Laterality Date  . Laminectomies      10/1975 and in 03/2005  . 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 Cardioversion      3 times from 2004-2006  . Tonsillectomy      1950  . Colonoscopy     Family History  Problem Relation Age of Onset  . Stroke Mother   . Vision loss Mother     macular degeneration  . Cancer Father     intestinal/GI  . Diabetes Paternal Aunt    History   Social History  . Marital Status: Married    Spouse Name: N/A    Number of Children: N/A  . Years of Education: N/A   Occupational History  . Not on file.   Social History Main Topics  . Smoking status: Never Smoker   . Smokeless tobacco: Never Used  . Alcohol Use: 4.2 oz/week    7 Glasses of wine per week     Comment: wine occasionally  . Drug Use: No  . Sexual Activity: Yes    Partners: Female   Other Topics Concern  . Not on  file   Social History Narrative   [name] - BS, [place]. married - '67. 2 sons - '74, '77  one son is a Nurse, learning disability for [place]; 4 grandchildren. 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 marriage in good health. End-of-life: discussed issues and provided packet with the MOST form and out of facility order.     Current Outpatient Prescriptions on File Prior to Visit  Medication Sig Dispense Refill  . aspirin 325 MG tablet Take 325 mg by mouth daily.      [name] atorvastatin (LIPITOR) 80 MG tablet Take 1 tablet (80 mg total) by mouth daily.  90 tablet  3  . Bilberry 60 MG CAPS Take by mouth daily.      . Coenzyme Q10-Vitamin E 100-300 MG-UNIT WAFR Take by mouth daily.      [name] dronedarone (MULTAQ) 400 MG tablet Take 1 tablet (400 mg total) by mouth 2 (two) times daily with a meal.  180 tablet  1  . fish oil-omega-3 fatty acids 1000 MG capsule  Take 3 g by mouth daily.      [name] loratadine (CLARITIN) 10 MG tablet Take 10 mg by mouth as needed for allergies.      . metoprolol succinate (TOPROL-XL) 50 MG 24 hr tablet Take 1 tablet (50 mg total) by mouth daily.    3  . multivitamin (THERAGRAN) per tablet Take 1 tablet by mouth daily.      [name] oseltamivir (TAMIFLU) 75 MG capsule Take 1 capsule (75 mg total) by mouth daily.  10 capsule  0  . Probiotic Product (PROBIOTIC FORMULA PO) Take by mouth every other day.      . pyridOXINE (VITAMIN B-6) 100 MG tablet Take 100 mg by mouth daily.      . sildenafil (VIAGRA) 100 MG tablet Take 1 tablet (100 mg total) by mouth as needed.  10 tablet  6  . Zn-Pyg Afri-Nettle-Saw Palmet (SAW PALMETTO COMPLEX PO) Take by mouth daily.       No current facility-administered medications on file prior to visit.      Review of Systems Constitutional:  Negative for fever, chills, activity change and unexpected weight change.  HEENT:  Negative for hearing loss, ear pain, congestion, neck stiffness and  postnasal drip. Negative for sore throat or swallowing problems. Negative for dental complaints.   Eyes: Negative for vision loss or change in visual acuity.  Respiratory: Negative for chest tightness and wheezing. Negative for DOE.   Cardiovascular: Negative for chest pain or palpitations. No decreased exercise tolerance Gastrointestinal: No change in bowel habit. No bloating or gas. No reflux or indigestion Genitourinary: Negative for urgency, frequency, flank pain and difficulty urinating.  Musculoskeletal: Negative for myalgias, back pain, arthralgias and gait problem.  Neurological: Negative for dizziness, tremors, weakness and headaches.  Hematological: Negative for adenopathy.  Psychiatric/Behavioral: Negative for behavioral problems and dysphoric mood.       Objective:   Physical Exam Filed Vitals:   05/16/13 1320  BP: 140/86  Pulse: 74  Temp: 97.9 F (36.6 C)   Wt Readings from Last 3 Encounters:  05/17/13 192 lb (87.091 kg)  05/16/13 191 lb 6.4 oz (86.818 kg)  12/26/12 207 lb (93.895 kg)   BP Readings from Last 3 Encounters:  05/17/13 144/74  05/16/13 140/86  12/26/12 137/83   Gen'l- WNDWD man in no distress HEENT- C&S clear, PERRLA Cor 2+ radial, RRR PUlm - normal respirations, Lungs CTAP Neuro - A&O, CN II-XII grossly intact, normal gait.       Assessment & Plan:

## 2013-05-16 NOTE — Progress Notes (Signed)
Pre visit review using our clinic review tool, if applicable. No additional management support is needed unless otherwise documented below in the visit note. 

## 2013-05-16 NOTE — Patient Instructions (Signed)
Good to see you. For continuing care will refer to Dr. Ronnald Ramp.  ED - will try the levitra. If this doesn't work then a referral to Dr. Diona Fanti is appropriate to consider cavernous injections

## 2013-05-17 ENCOUNTER — Ambulatory Visit (INDEPENDENT_AMBULATORY_CARE_PROVIDER_SITE_OTHER): Payer: Medicare Other | Admitting: Cardiology

## 2013-05-17 ENCOUNTER — Encounter: Payer: Self-pay | Admitting: Cardiology

## 2013-05-17 ENCOUNTER — Telehealth: Payer: Self-pay | Admitting: Cardiology

## 2013-05-17 VITALS — BP 144/74 | HR 87 | Ht 68.0 in | Wt 192.0 lb

## 2013-05-17 DIAGNOSIS — I4891 Unspecified atrial fibrillation: Secondary | ICD-10-CM | POA: Diagnosis not present

## 2013-05-17 DIAGNOSIS — I4892 Unspecified atrial flutter: Secondary | ICD-10-CM | POA: Diagnosis not present

## 2013-05-17 DIAGNOSIS — Z9889 Other specified postprocedural states: Secondary | ICD-10-CM | POA: Diagnosis not present

## 2013-05-17 DIAGNOSIS — I10 Essential (primary) hypertension: Secondary | ICD-10-CM

## 2013-05-17 DIAGNOSIS — E782 Mixed hyperlipidemia: Secondary | ICD-10-CM | POA: Diagnosis not present

## 2013-05-17 NOTE — Telephone Encounter (Signed)
Returned call to patient he stated last night he felt like his heart went out of rhythm.Pulse 100 to 120 beats/min.Stated he is going out of town next Wednesday and would like appointment.Dr.McLean out of office.Spoke to DOD Dr.Taylor he advised take a extra 50 mg toprol now and schedule patient appointment next week.Patient requested to be seen today.Appointment scheduled with Dr.McLean 05/17/13.

## 2013-05-17 NOTE — Telephone Encounter (Signed)
New message   Pt called states that he is experiencing arrythmia.. Requests a same day appt

## 2013-05-17 NOTE — Patient Instructions (Signed)
Your physician recommends that you schedule a follow-up appointment in the first week of April of with Dr. Caryl Comes consult for possible ablation.  Your physician recommends that you schedule a follow-up appointment in: early June  With Dr. [name] with Fasting lipid/hepatic.  Follow up on 05/21/13 for Nurse Visit for EKG.

## 2013-05-17 NOTE — Progress Notes (Signed)
Patient ID: [name] [name], male   DOB: 17-Jan-1941, 73 y.o.   MRN: [id number] PCP: Dr. 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  I started him on dronedarone and coumadin with plan for cardioversion after 4 wks of therapeutic anticoagulation if he did not convert back to NSR on his own.  He went back into NSR spontaneously and later stopped coumadin.   He felt like he was in NSR until last night. Last night, he and his wife ate out with friends and he had 3 glasses of wine.  He had not drunk much alcohol at all for several months, so this was a significant amount for him.  On the drive home, he felt palpitations/irregular heart rhythm and thought that he must be in an atrial arrhythmia.  No lightheadedness, no dyspnea, no chest pain.  He has been taking dronedarone.  He came in for evaluation today and was found to be in rate-controlled atypical atrial flutter, similar to 10/13.    ECG (12/12/11): Atypical atrial flutter versus atrial tachycardia with rate around 100 bpm, QTc normal.  ECG (12/29/11): NSR, 1st degree AV block PR 242 msec.    ECG (12/13): NSR, 1st degree AV block PR 244 msec    ECG: (10/14): NSR, 1st degree AV block ECG: (3/15): Atypical atrial flutter with rate 87, inferior T wave inversions  Labs (3/14): LDL 64, TSH normal, K 3.9, creatinine 1.0                                                                                                                                       PMH: 1. Atrial fibrillation: s/p MAZE and LAA ligation in 2006.  No documented atrial fibrillation since operation.  2. Atypical atrial flutter: 10/13 and again 3/15.  3. 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  Last  echo here in 5/11 with EF 60-65%, normal bioprosthetic aortic valve, normal bioprosthetic mitral valve, moderate LAE.  Echo 11/12 at [facility] looked ok per report. Echo (10/13) with EF 55-60%, mild LVH, normal bioprosthetic aortic and mitral valves, moderate LAE, PA systolic pressure 35 mmHg.  4. Hyperlipidemia 5. L-spine surgery 6. LHC with no significant coronary disease in 2006 pre-op.   SH: Married, retired Forensic psychologist [name], [place], [place]).  2 sons.  Never smoked.   FH: No premature CAD.   ROS: All systems reviewed and negative except as per HPI.  Current Outpatient Prescriptions  Medication Sig Dispense Refill  . aspirin 325 MG tablet Take 325 mg by mouth daily.      [name] atorvastatin (LIPITOR) 80 MG tablet Take 1 tablet (80 mg total)  by mouth daily.  90 tablet  3  . Bilberry 60 MG CAPS Take by mouth daily.      . Coenzyme Q10-Vitamin E 100-300 MG-UNIT WAFR Take by mouth daily.      [name] dronedarone (MULTAQ) 400 MG tablet Take 1 tablet (400 mg total) by mouth 2 (two) times daily with a meal.  180 tablet  1  . fish oil-omega-3 fatty acids 1000 MG capsule Take 3 g by mouth daily.      [name] loratadine (CLARITIN) 10 MG tablet Take 10 mg by mouth as needed for allergies.      . metoprolol succinate (TOPROL-XL) 50 MG 24 hr tablet Take 1 tablet (50 mg total) by mouth daily.    3  . multivitamin (THERAGRAN) per tablet Take 1 tablet by mouth daily.      [name] oseltamivir (TAMIFLU) 75 MG capsule Take 1 capsule (75 mg total) by mouth daily.  10 capsule  0  . Probiotic Product (PROBIOTIC FORMULA PO) Take by mouth every other day.      . pyridOXINE (VITAMIN B-6) 100 MG tablet Take 100 mg by mouth daily.      . sildenafil (VIAGRA) 100 MG tablet Take 1 tablet (100 mg total) by mouth as needed.  10 tablet  6  . Zn-Pyg Afri-Nettle-Saw Palmet (SAW PALMETTO COMPLEX PO) Take by mouth daily.       No current facility-administered medications for this visit.    BP 144/74  Pulse 87  Ht 5\' 8"  (1.727 m)   Wt 87.091 kg (192 lb)  BMI 29.20 kg/m2 General: NAD Neck: No JVD, no thyromegaly or thyroid nodule.  Lungs: Clear to auscultation bilaterally with normal respiratory effort. CV: Nondisplaced PMI.  Heart irregular S1/S2, no S3/S4, 1/6 early SEM RUSB.  No peripheral edema.  No carotid bruit.  Normal pedal pulses.  Abdomen: Soft, nontender, no hepatosplenomegaly, no distention.  Neurologic: Alert and oriented x 3.  Psych: Normal affect. Extremities: No clubbing or cyanosis.   Assessment/Plan: 1. Atypical atrial flutter: Patient had a MAZE procedure with LAA ligation at the time of his valve replacement.  Since then, he has had rare episodes of atypical atrial flutter. He had an episode in 10/13 that converted back to NSR on dronedarone.  Since that time until today, he feels like he has been in NSR.  In 10/13, I started him on coumadin in case he needed to be cardioverted but he converted to NSR spontaneously and I let him stop coumadin.  He is not a NOAC candidate with valvular atrial fibrillation.  CHADSVASC = 1, but thrombotic risk may be higher given valve replacement history.  However, as mentioned above he had LAA ligation.  I have  reviewed the literature on LAA ligation.  There is not a lot of data available but it does seem to be protective from stroke.  He is currently on ASA only.  He is symptomatic with palpitations but does not get short of breath with exertion or lightheaded.  His HR is controlled. - We discussed starting coumadin again and cardioverting him if he does not convert spontaneously.  However, he is going to be traveling to [place] for 10 days then to [place] for 3 weeks back to back, so monitoring will be a problem.  Therefore, we will not start coumadin at this time.  - Increase Toprol XL to 100 mg daily and continue dronedarone.  Hopefully he will convert spontaneously.   - I am going to have him  evaluated by EP in April between his trips.  I would like atrial flutter  ablation to be considered.    - I will have him return next week for an ECG to see if he stays in atrial flutter and to make sure that rate is controlled.  2. 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  Valves looked normal on 10/13 echo.  Will repeat echo in 10/15.  3. HTN: BP is reasonably controlled.  4. Hyperlipidemia: Patient will start atorvastatin when he runs out of Crestor.  Check lipids in 6/15.    Loralie Champagne 05/17/2013 10:21 PM

## 2013-05-19 NOTE — Assessment & Plan Note (Signed)
Appears to be stable. He is pleased with tissue valves and not needing to be anticoagulated.

## 2013-05-19 NOTE — Assessment & Plan Note (Signed)
will try the levitra. If this doesn't work then a referral to Dr. Diona Fanti is appropriate to consider cavernous injections

## 2013-05-20 ENCOUNTER — Ambulatory Visit: Payer: Medicare Other | Admitting: Cardiology

## 2013-05-20 ENCOUNTER — Telehealth: Payer: Self-pay | Admitting: Cardiology

## 2013-05-20 NOTE — Telephone Encounter (Signed)
Pt advised,verbalized understanding. 

## 2013-05-20 NOTE — Telephone Encounter (Signed)
Pt states he is not going to [place] until 05/24/13 mid morning instead of 05/22/13.  He would like to reconsider starting warfarin today and having DCCV prior to trip to [place] on 07/03/13 for 1 month. He feels he is still in at flutter today. Since he is not leaving for [place] until Friday he asked me to schedule EKG for 3/19 AM instead of 3/17 AM. He would like to start warfarin today, plan  INR Friday AM before leaving for [place]. He would be back from [place] and would be able to get another INR in 10 days from this Friday 3/20.  After that he would then be able to get weekly INRs to try to schedule DCCV mid-late April before going to [place] for 1 month 07/03/13. Pt is asking if he should stop aspirin if he starts warfarin today.  If he has DCCV (and converts to [place])  in April before going to [place] for 1 month he is also asking if he would be able to stop warfarin before going to [place]. I will forward to Dr [name] for review.

## 2013-05-20 NOTE — Telephone Encounter (Signed)
He can go ahead and start warfarin.  Needs therapeutic INR x 3 wks at least before DCCV.  He needs warfarin at least 1 month post-DCCV.  He would stop aspirin when starting warfarin.

## 2013-05-20 NOTE — Telephone Encounter (Signed)
New message     Pt saw Dr [name] Friday----want to start coumadin and talk to a nurse about other things

## 2013-05-20 NOTE — Telephone Encounter (Signed)
That would be dicey.  Would depend on what his number are looking like.  If he has been stable, coumadin clinic has been checking out as far as 6 wks but he will have just started. Would discuss with Gay Filler.  If he needs it checked in [place] may be able to be arranged.

## 2013-05-20 NOTE — Telephone Encounter (Signed)
LMTCB

## 2013-05-20 NOTE — Telephone Encounter (Signed)
°  Patient is returning your call, please call back.  °

## 2013-05-20 NOTE — Telephone Encounter (Signed)
He is going to be in [place] for 1 month starting April 29. Can he go a month before checking INR if he checks it before he goes?

## 2013-05-23 ENCOUNTER — Encounter: Payer: Self-pay | Admitting: *Deleted

## 2013-05-23 ENCOUNTER — Ambulatory Visit (INDEPENDENT_AMBULATORY_CARE_PROVIDER_SITE_OTHER): Payer: Medicare Other | Admitting: *Deleted

## 2013-05-23 VITALS — BP 122/78 | HR 90 | Ht 68.0 in | Wt 190.1 lb

## 2013-05-23 DIAGNOSIS — I4892 Unspecified atrial flutter: Secondary | ICD-10-CM

## 2013-05-23 NOTE — Progress Notes (Signed)
Pt comes in today for ekg. Ekg performed & read by DOD Dr. Irish Lack  Atrial flutter rate controlled. Horton Chin RN

## 2013-05-24 ENCOUNTER — Ambulatory Visit (INDEPENDENT_AMBULATORY_CARE_PROVIDER_SITE_OTHER): Payer: Medicare Other | Admitting: Pharmacist

## 2013-05-24 DIAGNOSIS — I4891 Unspecified atrial fibrillation: Secondary | ICD-10-CM | POA: Diagnosis not present

## 2013-05-24 DIAGNOSIS — Z5181 Encounter for therapeutic drug level monitoring: Secondary | ICD-10-CM | POA: Insufficient documentation

## 2013-05-24 LAB — POCT INR: INR: 1.7

## 2013-05-24 MED ORDER — WARFARIN SODIUM 5 MG PO TABS: ORAL_TABLET | ORAL | Status: DC

## 2013-05-29 ENCOUNTER — Ambulatory Visit (INDEPENDENT_AMBULATORY_CARE_PROVIDER_SITE_OTHER): Payer: Medicare Other | Admitting: Pharmacist

## 2013-05-29 ENCOUNTER — Other Ambulatory Visit: Payer: Self-pay | Admitting: Cardiology

## 2013-05-29 DIAGNOSIS — Z5181 Encounter for therapeutic drug level monitoring: Secondary | ICD-10-CM

## 2013-05-29 DIAGNOSIS — I4891 Unspecified atrial fibrillation: Secondary | ICD-10-CM

## 2013-05-29 DIAGNOSIS — I4892 Unspecified atrial flutter: Secondary | ICD-10-CM | POA: Diagnosis not present

## 2013-05-29 LAB — PROTIME-INR: INR: 2.3 — AB (ref 0.9–1.1)

## 2013-06-04 ENCOUNTER — Ambulatory Visit (INDEPENDENT_AMBULATORY_CARE_PROVIDER_SITE_OTHER): Payer: Medicare Other | Admitting: Pharmacist Clinician (PhC)/ Clinical Pharmacy Specialist

## 2013-06-04 DIAGNOSIS — I4891 Unspecified atrial fibrillation: Secondary | ICD-10-CM

## 2013-06-04 DIAGNOSIS — Z5181 Encounter for therapeutic drug level monitoring: Secondary | ICD-10-CM

## 2013-06-04 LAB — POCT INR: INR: 3.8

## 2013-06-05 ENCOUNTER — Ambulatory Visit (INDEPENDENT_AMBULATORY_CARE_PROVIDER_SITE_OTHER): Payer: Medicare Other | Admitting: Internal Medicine

## 2013-06-05 ENCOUNTER — Encounter: Payer: Self-pay | Admitting: Internal Medicine

## 2013-06-05 VITALS — BP 141/88 | HR 64 | Ht 68.0 in | Wt 195.0 lb

## 2013-06-05 DIAGNOSIS — I4892 Unspecified atrial flutter: Secondary | ICD-10-CM

## 2013-06-05 MED ORDER — METOPROLOL SUCCINATE ER 50 MG PO TB24: 50.0000 mg | ORAL_TABLET | Freq: Every day | ORAL | Status: DC

## 2013-06-05 NOTE — Progress Notes (Signed)
ELECTROPHYSIOLOGY CONSULT NOTE  Patient ID: [name] [name], MRN: [id number], DOB/AGE: 05/18/1940 73 y.o. Admit date: (Not on file) Date of Consult: 06/05/2013  Primary Physician: Scarlette Calico, MD Primary Cardiologist: DM  Chief Complaint: Atrial Fluuter   HPI [name] [name] is a 73 y.o. male seen for consideration of treatment options in the setting of atrial flutter following cardiac surgery in 2006.  He is status post bioprosthetic aVR/MVR at the [facility] in conjunction with a Maze Operation and LAA ligation undertaken 2006 He was started on dronaderone and coumadin and then spontaneously reverted to sinus rhythm. It is his impression of late that he has been in out of atrial flutter. He notes this both by symptoms as well as palpation of his carotid pulse. There are only modest symptoms associated exercise intolerance. Is no prior history of a TIA.    Echo (10/13) with EF 55-60%, mild LVH, normal bioprosthetic aortic and mitral valves, moderate LAE, PA systolic pressure 35 mmHg  Past Medical History  Diagnosis Date  . Psychosexual dysfunction with inhibited sexual excitement   . Other and unspecified hyperlipidemia   . 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 Lesion of ulnar nerve   . External hemorrhoids without mention of complication   . Acute rheumatic pericarditis   . Acute rheumatic heart disease, unspecified     childhood, age  86 & 61  . 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 Atrial fibrillation     history  . Diverticulosis       Surgical History:  Past Surgical History  Procedure Laterality Date  . Laminectomies      10/1975 and in 03/2005  . 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 Cardioversion      3 times from 2004-2006  . Tonsillectomy      1950  . Colonoscopy       Home Meds: Prior to Admission medications   Medication Sig Start Date End Date Taking? Authorizing Provider  atorvastatin (LIPITOR) 80 MG  tablet Take 1 tablet (80 mg total) by mouth daily. 02/04/13  Yes Larey Dresser, MD  Bilberry 60 MG CAPS Take by mouth daily.   Yes Historical Provider, MD  Coenzyme Q10-Vitamin E 100-300 MG-UNIT WAFR Take by mouth daily.   Yes Historical Provider, MD  dronedarone (MULTAQ) 400 MG tablet Take 1 tablet (400 mg total) by mouth 2 (two) times daily with a meal. 02/04/13  Yes Larey Dresser, MD  fish oil-omega-3 fatty acids 1000 MG capsule Take 3 g by mouth daily.   Yes Historical Provider, MD  loratadine (CLARITIN) 10 MG tablet Take 10 mg by mouth as needed for allergies.   Yes Historical Provider, MD  metoprolol succinate (TOPROL-XL) 100 MG 24 hr tablet Take 100 mg by mouth daily. Take with or immediately following a meal.   Yes Historical Provider, MD  multivitamin [facility]) per tablet Take 1 tablet by mouth daily.   Yes Historical Provider, MD  Probiotic Product (PROBIOTIC FORMULA PO) Take 2 capsules by mouth daily.    Yes Historical Provider, MD  pyridOXINE (VITAMIN B-6) 100 MG tablet Take 100 mg by mouth daily.   Yes Historical Provider, MD  sildenafil (VIAGRA) 100 MG tablet Take 1 tablet (100 mg total) by mouth as needed. 08/01/12  Yes Neena Rhymes, MD  warfarin (COUMADIN) 5 MG tablet Take as directed by Coumadin Clinic. 05/24/13  Yes Larey Dresser, MD  Zn-Pyg Afri-Nettle-Saw Palmet (SAW PALMETTO COMPLEX PO) Take by mouth daily.   Yes Historical Provider, MD     Allergies:  Allergies  Allergen Reactions  . Naproxen     REACTION: Hives    History   Social History  . Marital Status: Married    Spouse Name: N/A    Number of Children: N/A  . Years of Education: N/A   Occupational History  . Not on file.   Social History Main Topics  . Smoking status: Never Smoker   . Smokeless tobacco: Never Used  . Alcohol Use: 4.2 oz/week    7 Glasses of wine per week     Comment: wine occasionally  . Drug Use: No  . Sexual Activity: Yes    Partners: Female   Other Topics Concern  .  Not on file   Social History Narrative   [name] - BS, [place]. married - '67. 2 sons - '74, '77  one son is a Nurse, learning disability for [place]; 4 grandchildren. 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 marriage in good health. End-of-life: discussed issues and provided packet with the MOST form and out of facility order.     Family History  Problem Relation Age of Onset  . Stroke Mother   . Vision loss Mother     macular degeneration  . Cancer Father     intestinal/GI  . Diabetes Paternal Aunt      ROS:  Please see the history of present illness.     All other systems reviewed and negative.    Physical Exam:   Blood pressure 141/88, pulse 64, height 5\' 8"  (1.727 m), weight 195 lb (88.451 kg). General: Well developed, well nourished male in no acute distress. Head: Normocephalic, atraumatic, sclera non-icteric, no xanthomas, nares are without discharge. EENT: normal Lymph Nodes:  none Back: without scoliosis/kyphosis , no CVA tendersness Neck: Negative for carotid bruits. JVD not elevated. Lungs: Clear bilaterally to auscultation without wheezes, rales, or rhonchi. Breathing is unlabored. Heart: RRR with S1 S2.  2/6 systolic murmur , rubs, or gallops appreciated. Abdomen: Soft, non-tender, non-distended with normoactive bowel sounds. No hepatomegaly. No rebound/guarding. No obvious abdominal masses. Msk:  Strength and tone appear normal for age. Extremities: No clubbing or cyanosis. No edema.  Distal pedal pulses are 2+ and equal bilaterally. Skin: Warm and Dry Neuro: Alert and oriented X 3. CN III-XII intact Grossly normal sensory and motor function . Psych:  Responds to questions appropriately with a normal affect.      Labs: Cardiac Enzymes No results found for this basename: CKTOTAL, CKMB, TROPONINI,  in the last 72 hours CBC Lab Results  Component Value Date   WBC 8.9 05/17/2012   HGB 13.2  05/17/2012   HCT 38.8* 05/17/2012   MCV 92.4 05/17/2012   PLT 228.0 05/17/2012   PROTIME:  Recent Labs  06/04/13 1027  INR 3.8   Chemistry No results found for this basename: NA, K, CL, CO2, BUN, CREATININE, CALCIUM, LABALBU, PROT, BILITOT, ALKPHOS, ALT, AST, GLUCOSE,  in the last 168 hours Lipids Lab Results  Component Value Date   CHOL 119 05/17/2012   HDL 37.80* 05/17/2012   LDLCALC 64 05/17/2012   TRIG 88.0 05/17/2012   BNP No results found for this basename: probnp   Miscellaneous No results found for this basename: DDIMER    Radiology/Studies:  No results found.  EKG:  sinus 64 .25/11/42 Otherwise normal  Assessment and Plan:  Atypical atrial flutter  AVR/MVR/MAZE/LAA ligation  Rheumatic heart disease  The patient has had frequent recurrences of atrial flutter interspersed with sinus rhythm over recent weeks following initiation of Coumadin with the anticipation of cardioversion. It is not clear about the utility of Coumadin in patients with prior left atrial appendage ligation is Dr. DM as noted and so in the absence of planning cardioversion we will have him hold his Coumadin.  Strategies to maintain sinus rhythm in the short-term would include alternative antiarrhythmic therapy or potentially addition of Ranexa to the dronaderone. In the intermediate term however, I would anticipate referral to [facility] for catheter ablation. He is agreeable with the aforementioned plan.    Virl Axe

## 2013-06-05 NOTE — Patient Instructions (Addendum)
Your physician has recommended you make the following change in your medication:  1) Decrease Toprol to 50 mg daily   Dr. Caryl Comes will contact the [facility]

## 2013-06-06 ENCOUNTER — Telehealth: Payer: Self-pay | Admitting: *Deleted

## 2013-06-06 NOTE — Telephone Encounter (Signed)
Patient called and was wanting to follow up concerning the medication that he thought that he was to start taking today, but he did not know the name of it. Please advise, and he can be reached on his cell phone. Thanks, MI

## 2013-06-10 NOTE — Telephone Encounter (Signed)
Dr. Caryl Comes called pt to discuss this. He informed patient that he was waiting to speak with Dr. Boyd Kerbs at [facility] clinic to talk in detail about possibly adding Ranexa. He informed pt that he also talked with Dr. [name] who is enthusiastic about the idea. He explained that we would contact him once discussion has taken place w/ Dr. Boyd Kerbs. Pt is agreeable to plan and thankful for help

## 2013-06-11 ENCOUNTER — Ambulatory Visit: Payer: Self-pay | Admitting: Pharmacist

## 2013-06-11 DIAGNOSIS — I4891 Unspecified atrial fibrillation: Secondary | ICD-10-CM

## 2013-06-11 DIAGNOSIS — Z5181 Encounter for therapeutic drug level monitoring: Secondary | ICD-10-CM

## 2013-06-12 ENCOUNTER — Ambulatory Visit: Payer: Medicare Other | Admitting: Physician Assistant

## 2013-06-19 ENCOUNTER — Telehealth: Payer: Self-pay | Admitting: Internal Medicine

## 2013-06-19 NOTE — Telephone Encounter (Signed)
New problem   Pt want to know status of dr Caryl Comes prescribing heart medication for him. Also status of scheduling appt at [facility] in late June. Please call pt

## 2013-06-20 NOTE — Telephone Encounter (Signed)
Informed pt that Dr. Caryl Comes did speak w/ [name] [facility] enthusiastic about Ranexa), but still waiting to talk w/ Dr. Boyd Kerbs.  He wants Caryl Comes to know that he seems to be in [place] since OV. Pt leaving at end of the month and will be out of the country until end of May but his cell phone will be working while gone. He is aware Caryl Comes is out of the country until the end of April. Pt will call once he returns

## 2013-07-04 NOTE — Telephone Encounter (Signed)
07/03/13, afternoon:  Dr. Caryl Comes called pt and left message asking  If Dr. Boyd Kerbs [facility]) had called them yet.

## 2013-08-05 ENCOUNTER — Inpatient Hospital Stay (HOSPITAL_COMMUNITY)
Admission: EM | Admit: 2013-08-05 | Discharge: 2013-08-07 | DRG: 065 | Disposition: A | Discharge status: Home / Self Care | Payer: Medicare Other | Attending: Internal Medicine | Admitting: Internal Medicine

## 2013-08-05 ENCOUNTER — Other Ambulatory Visit (INDEPENDENT_AMBULATORY_CARE_PROVIDER_SITE_OTHER): Payer: Medicare Other

## 2013-08-05 ENCOUNTER — Encounter (HOSPITAL_COMMUNITY): Payer: Self-pay | Admitting: Emergency Medicine

## 2013-08-05 ENCOUNTER — Emergency Department (HOSPITAL_COMMUNITY): Payer: Medicare Other

## 2013-08-05 ENCOUNTER — Telehealth: Payer: Self-pay | Admitting: Internal Medicine

## 2013-08-05 DIAGNOSIS — R29810 Facial weakness: Secondary | ICD-10-CM | POA: Diagnosis present

## 2013-08-05 DIAGNOSIS — E782 Mixed hyperlipidemia: Secondary | ICD-10-CM | POA: Diagnosis not present

## 2013-08-05 DIAGNOSIS — I6789 Other cerebrovascular disease: Secondary | ICD-10-CM | POA: Diagnosis not present

## 2013-08-05 DIAGNOSIS — Z823 Family history of stroke: Secondary | ICD-10-CM | POA: Diagnosis not present

## 2013-08-05 DIAGNOSIS — I719 Aortic aneurysm of unspecified site, without rupture: Secondary | ICD-10-CM | POA: Diagnosis not present

## 2013-08-05 DIAGNOSIS — Z952 Presence of prosthetic heart valve: Secondary | ICD-10-CM

## 2013-08-05 DIAGNOSIS — I099 Rheumatic heart disease, unspecified: Secondary | ICD-10-CM | POA: Diagnosis present

## 2013-08-05 DIAGNOSIS — R471 Dysarthria and anarthria: Secondary | ICD-10-CM | POA: Diagnosis present

## 2013-08-05 DIAGNOSIS — G8190 Hemiplegia, unspecified affecting unspecified side: Secondary | ICD-10-CM | POA: Diagnosis present

## 2013-08-05 DIAGNOSIS — Z7982 Long term (current) use of aspirin: Secondary | ICD-10-CM

## 2013-08-05 DIAGNOSIS — I4892 Unspecified atrial flutter: Secondary | ICD-10-CM | POA: Diagnosis present

## 2013-08-05 DIAGNOSIS — R4789 Other speech disturbances: Secondary | ICD-10-CM | POA: Diagnosis present

## 2013-08-05 DIAGNOSIS — I6350 Cerebral infarction due to unspecified occlusion or stenosis of unspecified cerebral artery: Principal | ICD-10-CM | POA: Diagnosis present

## 2013-08-05 DIAGNOSIS — G459 Transient cerebral ischemic attack, unspecified: Secondary | ICD-10-CM

## 2013-08-05 DIAGNOSIS — Z7901 Long term (current) use of anticoagulants: Secondary | ICD-10-CM | POA: Diagnosis not present

## 2013-08-05 DIAGNOSIS — I10 Essential (primary) hypertension: Secondary | ICD-10-CM | POA: Diagnosis not present

## 2013-08-05 DIAGNOSIS — I6340 Cerebral infarction due to embolism of unspecified cerebral artery: Secondary | ICD-10-CM | POA: Diagnosis not present

## 2013-08-05 DIAGNOSIS — E785 Hyperlipidemia, unspecified: Secondary | ICD-10-CM | POA: Diagnosis not present

## 2013-08-05 DIAGNOSIS — Z79899 Other long term (current) drug therapy: Secondary | ICD-10-CM | POA: Diagnosis not present

## 2013-08-05 DIAGNOSIS — I639 Cerebral infarction, unspecified: Secondary | ICD-10-CM

## 2013-08-05 DIAGNOSIS — R29818 Other symptoms and signs involving the nervous system: Secondary | ICD-10-CM | POA: Diagnosis not present

## 2013-08-05 DIAGNOSIS — I4891 Unspecified atrial fibrillation: Secondary | ICD-10-CM | POA: Diagnosis present

## 2013-08-05 DIAGNOSIS — I369 Nonrheumatic tricuspid valve disorder, unspecified: Secondary | ICD-10-CM | POA: Diagnosis not present

## 2013-08-05 DIAGNOSIS — R4701 Aphasia: Secondary | ICD-10-CM | POA: Diagnosis not present

## 2013-08-05 HISTORY — DX: Cerebral infarction, unspecified: I63.9

## 2013-08-05 LAB — COMPREHENSIVE METABOLIC PANEL
ALT: 50 U/L (ref 0–53)
AST: 35 U/L (ref 0–37)
Albumin: 3.9 g/dL (ref 3.5–5.2)
Alkaline Phosphatase: 78 U/L (ref 39–117)
BILIRUBIN TOTAL: 0.5 mg/dL (ref 0.3–1.2)
BUN: 30 mg/dL — ABNORMAL HIGH (ref 6–23)
CHLORIDE: 106 meq/L (ref 96–112)
CO2: 22 meq/L (ref 19–32)
Calcium: 9.1 mg/dL (ref 8.4–10.5)
Creatinine, Ser: 0.93 mg/dL (ref 0.50–1.35)
GFR calc Af Amer: >90 mL/min (ref >90)
GFR, EST NON AFRICAN AMERICAN: 83 mL/min — AB (ref >90)
GLUCOSE: 111 mg/dL — AB (ref 70–99)
Potassium: 4.3 mEq/L (ref 3.7–5.3)
SODIUM: 143 meq/L (ref 137–147)
Total Protein: 7.1 g/dL (ref 6.0–8.3)

## 2013-08-05 LAB — DIFFERENTIAL
BASOS ABS: 0 10*3/uL (ref 0.0–0.1)
Basophils Relative: 0 % (ref 0–1)
Eosinophils Absolute: 0.4 10*3/uL (ref 0.0–0.7)
Eosinophils Relative: 4 % (ref 0–5)
LYMPHS ABS: 2.4 10*3/uL (ref 0.7–4.0)
LYMPHS PCT: 25 % (ref 12–46)
Monocytes Absolute: 0.9 10*3/uL (ref 0.1–1.0)
Monocytes Relative: 10 % (ref 3–12)
NEUTROS ABS: 5.7 10*3/uL (ref 1.7–7.7)
Neutrophils Relative %: 61 % (ref 43–77)

## 2013-08-05 LAB — URINALYSIS, ROUTINE W REFLEX MICROSCOPIC
Bilirubin Urine: NEGATIVE
Glucose, UA: NEGATIVE mg/dL
Hgb urine dipstick: NEGATIVE
KETONES UR: NEGATIVE mg/dL
LEUKOCYTES UA: NEGATIVE
Nitrite: NEGATIVE
PH: 6 (ref 5.0–8.0)
Protein, ur: NEGATIVE mg/dL
SPECIFIC GRAVITY, URINE: 1.027 (ref 1.005–1.030)
Urobilinogen, UA: 1 mg/dL (ref 0.0–1.0)

## 2013-08-05 LAB — I-STAT CHEM 8, ED
BUN: 29 mg/dL — ABNORMAL HIGH (ref 6–23)
CALCIUM ION: 1.19 mmol/L (ref 1.13–1.30)
CHLORIDE: 105 meq/L (ref 96–112)
Creatinine, Ser: 1.1 mg/dL (ref 0.50–1.35)
Glucose, Bld: 104 mg/dL — ABNORMAL HIGH (ref 70–99)
HEMATOCRIT: 44 % (ref 39.0–52.0)
Hemoglobin: 15 g/dL (ref 13.0–17.0)
POTASSIUM: 4.1 meq/L (ref 3.7–5.3)
Sodium: 146 mEq/L (ref 137–147)
TCO2: 22 mmol/L (ref 0–100)

## 2013-08-05 LAB — I-STAT TROPONIN, ED: Troponin i, poc: 0 ng/mL (ref 0.00–0.08)

## 2013-08-05 LAB — CBC
HCT: 38.3 % — ABNORMAL LOW (ref 39.0–52.0)
HEMATOCRIT: 40.3 % (ref 39.0–52.0)
Hemoglobin: 13.2 g/dL (ref 13.0–17.0)
Hemoglobin: 13.7 g/dL (ref 13.0–17.0)
MCH: 32.2 pg (ref 26.0–34.0)
MCH: 32.5 pg (ref 26.0–34.0)
MCHC: 34 g/dL (ref 30.0–36.0)
MCHC: 34.5 g/dL (ref 30.0–36.0)
MCV: 94.8 fL (ref 78.0–100.0)
MCV: 94.3 fL (ref 78.0–100.0)
Platelets: 261 10*3/uL (ref 150–400)
Platelets: 268 10*3/uL (ref 150–400)
RBC: 4.25 MIL/uL (ref 4.22–5.81)
RBC: 4.06 MIL/uL — AB (ref 4.22–5.81)
RDW: 14.8 % (ref 11.5–15.5)
RDW: 14.8 % (ref 11.5–15.5)
WBC: 9.3 10*3/uL (ref 4.0–10.5)
WBC: 9.5 10*3/uL (ref 4.0–10.5)

## 2013-08-05 LAB — RAPID URINE DRUG SCREEN, HOSP PERFORMED
Amphetamines: NOT DETECTED
Barbiturates: NOT DETECTED
Benzodiazepines: NOT DETECTED
Cocaine: NOT DETECTED
Opiates: NOT DETECTED
Tetrahydrocannabinol: NOT DETECTED

## 2013-08-05 LAB — HEPATIC FUNCTION PANEL
ALT: 46 U/L (ref 0–53)
AST: 28 U/L (ref 0–37)
Albumin: 3.8 g/dL (ref 3.5–5.2)
Alkaline Phosphatase: 64 U/L (ref 39–117)
BILIRUBIN TOTAL: 1.3 mg/dL — AB (ref 0.2–1.2)
Bilirubin, Direct: 0.2 mg/dL (ref 0.0–0.3)
Total Protein: 6.6 g/dL (ref 6.0–8.3)

## 2013-08-05 LAB — PROTIME-INR
INR: 0.99 (ref 0.00–1.49)
PROTHROMBIN TIME: 12.9 s (ref 11.6–15.2)

## 2013-08-05 LAB — LIPID PANEL
Cholesterol: 111 mg/dL (ref 0–200)
HDL: 48.2 mg/dL (ref >39.00)
LDL Cholesterol: 54 mg/dL (ref 0–99)
TRIGLYCERIDES: 42 mg/dL (ref 0.0–149.0)
Total CHOL/HDL Ratio: 2
VLDL: 8.4 mg/dL (ref 0.0–40.0)

## 2013-08-05 LAB — APTT: APTT: 31 s (ref 24–37)

## 2013-08-05 LAB — ETHANOL: Alcohol, Ethyl (B): <11 mg/dL (ref 0–11)

## 2013-08-05 MED ORDER — ENOXAPARIN SODIUM 40 MG/0.4ML ~~LOC~~ SOLN
40.0000 mg | Freq: Every day | SUBCUTANEOUS | Status: DC
  Administered 2013-08-06 – 2013-08-07 (×2): 40 mg via SUBCUTANEOUS
  Filled 2013-08-05 (×2): qty 0.4

## 2013-08-05 MED ORDER — ASPIRIN 325 MG PO TABS
325.0000 mg | ORAL_TABLET | Freq: Every day | ORAL | Status: DC
  Administered 2013-08-06 (×2): 325 mg via ORAL
  Filled 2013-08-05 (×3): qty 1

## 2013-08-05 MED ORDER — DRONEDARONE HCL 400 MG PO TABS
400.0000 mg | ORAL_TABLET | Freq: Once | ORAL | Status: AC
  Administered 2013-08-05: 400 mg via ORAL
  Filled 2013-08-05: qty 1

## 2013-08-05 MED ORDER — OMEGA-3-ACID ETHYL ESTERS 1 G PO CAPS: 3.0000 g | ORAL_CAPSULE | Freq: Every day | ORAL | Status: DC

## 2013-08-05 MED ORDER — DRONEDARONE HCL 400 MG PO TABS
400.0000 mg | ORAL_TABLET | Freq: Two times a day (BID) | ORAL | Status: DC
  Administered 2013-08-06 – 2013-08-07 (×3): 400 mg via ORAL
  Filled 2013-08-05 (×5): qty 1

## 2013-08-05 MED ORDER — METOPROLOL SUCCINATE ER 50 MG PO TB24
50.0000 mg | ORAL_TABLET | Freq: Every day | ORAL | Status: DC
  Administered 2013-08-06 – 2013-08-07 (×2): 50 mg via ORAL
  Filled 2013-08-05 (×2): qty 1

## 2013-08-05 MED ORDER — ATORVASTATIN CALCIUM 80 MG PO TABS
80.0000 mg | ORAL_TABLET | Freq: Every day | ORAL | Status: DC
  Administered 2013-08-05 – 2013-08-06 (×2): 80 mg via ORAL
  Filled 2013-08-05 (×3): qty 1

## 2013-08-05 MED ORDER — OMEGA-3-ACID ETHYL ESTERS 1 G PO CAPS
1000.0000 mg | ORAL_CAPSULE | Freq: Two times a day (BID) | ORAL | Status: DC
  Administered 2013-08-06 – 2013-08-07 (×4): 1000 mg via ORAL
  Filled 2013-08-05 (×5): qty 1

## 2013-08-05 MED ORDER — VITAMIN B-6 100 MG PO TABS
100.0000 mg | ORAL_TABLET | Freq: Every day | ORAL | Status: DC
  Administered 2013-08-06 – 2013-08-07 (×2): 100 mg via ORAL
  Filled 2013-08-05 (×2): qty 1

## 2013-08-05 MED ORDER — SENNOSIDES-DOCUSATE SODIUM 8.6-50 MG PO TABS: 1.0000 | ORAL_TABLET | Freq: Every evening | ORAL | Status: DC | PRN

## 2013-08-05 MED ORDER — OMEGA-3 FATTY ACIDS 1000 MG PO CAPS: 3.0000 g | ORAL_CAPSULE | Freq: Every day | ORAL | Status: DC

## 2013-08-05 NOTE — Telephone Encounter (Signed)
Left message on machine for pt to contact the office.   

## 2013-08-05 NOTE — H&P (Addendum)
Hospitalist Admission History and Physical  Patient name: [name] [name] Medical record number: [id number] Date of birth: 1940/07/09 Age: 73 y.o. Gender: male  Primary Care Provider: Scarlette Calico, MD  Chief Complaint: TIA  History of Present Illness:This is a 73 y.o. year old male with prior hx/o Afib s/p ablative procedure as well as mitral and aortic valve replacement (bioprosthetic), HTN, presenting with TIA. Pt recently [place] from [place] trip. States that he was having conversation with his brother over the phone when he became acutely confused with R sided facial droop and weakness. Sxs lasted approx 10-15 mins and then self resolved. Pt noted have been recently in and out of afib/aflutter. Has been on antiarrhythmic dronedarone as well as metoprolol for management. Was supposed to be started on coumadin for anticoagulation w/in last 3 months, however was unable to be started because of monitoring as pt has been on several trips. Pt denies any CP, SOB, LE pain   On presentation, neuro sxs resolved. BPs 120s-150s/80s-100s. Labwork otherwise WNL. Head CT with a left parietal infarct that appeared to be subacute or chronic. No evidence of acute infarct. Initial fibrillation with 4-1 block on EKG. Neurology consult. Recommend medical admission.  Assessment and Plan: [name] [name] is a 73 y.o. year old male presenting with TIA  Active Problems:   TIA (transient ischemic attack)   TIA: We'll proceed on stroke pathway including MRI/MRA(bioprosthetic aortic and mitral valve should not be a contraindication to this-will defer to radiology), carotid Dopplers, 2-D echo, risk stratification labs. Appreciate neurology Rex. Continue full dose aspirin. Will need to have a discussion conjunction with cardiology as to appropriate anticoagulation for patient.  Atrial fibrillation: Continue metoprolol and antiarrhythmic. Cardiology consult. Telemetry bed.  FEN/GI: NPO pending bedside swallow eval.   Prophylaxis: lovenox  Disposition: pending further evaluation  Code Status:Full Code    Patient Active Problem List   Diagnosis Date Noted  . TIA (transient ischemic attack) 08/05/2013  . Atrial flutter 05/17/2013  . Essential hypertension 06/19/2012  . Routine health maintenance 04/26/2011  . FATIGUE / MALAISE 04/10/2009  . HYPERLIPIDEMIA-MIXED 07/07/2008  . SVT/ PSVT/ PAT 07/07/2008  . HEART VALVE REPLACEMENT, HX OF 07/07/2008  . DIVERTICULOSIS-COLON 06/17/2008  . COLONIC POLYPS, ADENOMATOUS, HX OF 06/16/2008  . ERECTILE DYSFUNCTION 09/25/2007  . LESION OF ULNAR NERVE 09/25/2007  . ACUTE RHEUMATIC PERICARDITIS 09/25/2007  . MULTIPLE INVOLVEMENT OF MITRAL AND AORTIC VALVES 09/25/2007  . EXTERNAL HEMORRHOIDS WITHOUT MENTION COMP 09/25/2007  . RHEUMATIC FEVER WITH HEART INVOLVEMENT 09/24/2007   Past Medical History: Past Medical History  Diagnosis Date  . Psychosexual dysfunction with inhibited sexual excitement   . Other and unspecified hyperlipidemia   . 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 Lesion of ulnar nerve   . External hemorrhoids without mention of complication   . Acute rheumatic pericarditis   . Acute rheumatic heart disease, unspecified     childhood, age  59 & 14  . 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 Atrial fibrillation     history  . Diverticulosis     Past Surgical History: Past Surgical History  Procedure Laterality Date  . Laminectomies      10/1975 and in 03/2005  . 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 Cardioversion      3 times from 2004-2006  . Tonsillectomy      1950  . Colonoscopy  Social History: History   Social History  . Marital Status: Married    Spouse Name: N/A    Number of Children: N/A  . Years of Education: N/A   Social History Main Topics  . Smoking status: Never Smoker   . Smokeless tobacco: Never Used  . Alcohol Use: 4.2 oz/week    7 Glasses of wine per week      Comment: wine occasionally  . Drug Use: No  . Sexual Activity: Yes    Partners: Female   Other Topics Concern  . None   Social History Narrative   [place] - BS, JD. married - '67. 2 sons - '74, '77  one son is a Nurse, learning disability for [employer] - [employer]; 4 grandchildren. 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 marriage in good health. End-of-life: discussed issues and provided packet with the MOST form and out of facility order.    Family History: Family History  Problem Relation Age of Onset  . Stroke Mother   . Vision loss Mother     macular degeneration  . Cancer Father     intestinal/GI  . Diabetes Paternal Aunt     Allergies: Allergies  Allergen Reactions  . Naproxen     REACTION: Hives    Current Facility-Administered Medications  Medication Dose Route Frequency Provider Last Rate Last Dose  . atorvastatin (LIPITOR) tablet 80 mg  80 mg Oral Daily Elwyn Lade, PA-C   80 mg at 08/05/13 2219  . [START ON 08/06/2013] dronedarone (MULTAQ) tablet 400 mg  400 mg Oral BID WC Elwyn Lade, PA-C      . enoxaparin (LOVENOX) injection 40 mg  40 mg Subcutaneous Q24H Shanda Howells, MD      . Derrill Memo ON 08/06/2013] metoprolol succinate (TOPROL-XL) 24 hr tablet 50 mg  50 mg Oral Daily Elwyn Lade, PA-C      . [START ON 08/06/2013] omega-3 acid ethyl esters (LOVAZA) capsule 3 g  3 g Oral Daily Veryl Speak, MD      . Derrill Memo ON 08/06/2013] pyridOXINE (VITAMIN B-6) tablet 100 mg  100 mg Oral Daily Hannah S Merrell, PA-C      . senna-docusate (Senokot-S) tablet 1 tablet  1 tablet Oral QHS PRN Shanda Howells, MD       Current Outpatient Prescriptions  Medication Sig Dispense Refill  . aspirin 325 MG tablet Take 325 mg by mouth daily.      [name] atorvastatin (LIPITOR) 80 MG tablet Take 1 tablet (80 mg total) by mouth daily.  90 tablet  3  . Bilberry 60 MG CAPS Take by mouth daily.      . Coenzyme Q10-Vitamin E 100-300  MG-UNIT WAFR Take by mouth daily.      . Cyanocobalamin (VITAMIN B 12 PO) Take 1 tablet by mouth daily.      [name] dronedarone (MULTAQ) 400 MG tablet Take 1 tablet (400 mg total) by mouth 2 (two) times daily with a meal.  180 tablet  1  . fish oil-omega-3 fatty acids 1000 MG capsule Take 3 g by mouth daily.      [name] loratadine (CLARITIN) 10 MG tablet Take 10 mg by mouth as needed for allergies.      . metoprolol succinate (TOPROL-XL) 50 MG 24 hr tablet Take 1 tablet (50 mg total) by mouth daily. Take with or immediately following a meal.  30 tablet  6  . multivitamin (THERAGRAN) per tablet  Take 1 tablet by mouth daily.      . Omega-3 Fatty Acids (FISH OIL) 1000 MG CAPS Take 1,000 mg by mouth 2 (two) times daily.      . Probiotic Product (PROBIOTIC FORMULA PO) Take 2 capsules by mouth daily.       [name] Zn-Pyg Afri-Nettle-Saw Palmet (SAW PALMETTO COMPLEX PO) Take by mouth daily.      . sildenafil (VIAGRA) 100 MG tablet Take 1 tablet (100 mg total) by mouth as needed.  10 tablet  6   Review Of Systems: 12 point ROS negative except as noted above in HPI.  Physical Exam: Filed Vitals:   08/05/13 2307  BP:   Pulse: 76  Temp:   Resp: 22    General: alert and cooperative HEENT: PERRLA and extra ocular movement intact Heart: S1, S2 normal, no murmur, rub or gallop, regular rate and rhythm Lungs: clear to auscultation, no wheezes or rales and unlabored breathing Abdomen: abdomen is soft without significant tenderness, masses, organomegaly or guarding Extremities: extremities normal, atraumatic, no cyanosis or edema and Homans sign is negative, no sign of DVT Skin:no rashes, no ecchymoses Neurology: normal without focal findings  Labs and Imaging: Lab Results  Component Value Date/Time   NA 146 08/05/2013  8:41 PM   K 4.1 08/05/2013  8:41 PM   CL 105 08/05/2013  8:41 PM   CO2 22 08/05/2013  8:34 PM   BUN 29* 08/05/2013  8:41 PM   CREATININE 1.10 08/05/2013  8:41 PM   GLUCOSE 104* 08/05/2013  8:41 PM   Lab  Results  Component Value Date   WBC 9.5 08/05/2013   HGB 15.0 08/05/2013   HCT 44.0 08/05/2013   MCV 94.8 08/05/2013   PLT 268 08/05/2013    Ct Head Wo Contrast  08/05/2013   CLINICAL DATA:  Right-sided phase through pain. Difficulty getting words out. Some right arm numbness. Symptoms currently resolved.  EXAM: CT HEAD WITHOUT CONTRAST  TECHNIQUE: Contiguous axial images were obtained from the base of the skull through the vertex without intravenous contrast.  COMPARISON:  None.  FINDINGS: Ventricles are normal in configuration. There is ventricular and sulcal enlargement reflecting mild atrophy. No hydrocephalus.  There is a well-defined area of hypoattenuation in the left parietal lobe involving portions of the gray-white junction. This is not acute, but could reflect a subacute infarct. It may be chronic.  No other evidence of an infarct. No parenchymal masses or mass effect. Mild areas of white matter hypoattenuation are noted most consistent with chronic microvascular ischemic change.  No extra-axial masses or abnormal fluid collections.  No intracranial hemorrhage.  Visualized sinuses and mastoid air cells are clear. No skull lesion.  IMPRESSION: 1. 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 There is no evidence of an acute infarct. 2. Mild atrophy and chronic microvascular ischemic change. 3. No other abnormalities.   Electronically Signed   By: Lajean Manes M.D.   On: 08/05/2013 21:40           Shanda Howells MD  Pager: [phone]

## 2013-08-05 NOTE — Consult Note (Signed)
Neurology Consultation Reason for Consult: TIA Referring Physician: Stark Jock, D  CC: Right sided weakness  History is obtained from:patient  HPI: [name] [name] is a 73 y.o. male history of rheumatic heart disease with involvement of the mitral and aortic valves as well as visual fibrillation. He has had the replacements, and has had intermittent atrial fibrillation most recently in March was planning on having DC cardioversion spontaneously reverted. His cardiologist noted that he had had a left atrial appendage ablation and therefore discontinued his Coumadin. He continues to be in and out of atrial fibrillation.  Tonight, he was talking with his brother when he had sudden onset of difficulty speaking with preserved understanding. He also had right facial droop and right-sided numbness. This completely resolved.   LKW: 6:30 PM tpa given?: no, symptoms resolve    ROS: A 14 point ROS was performed and is negative except as noted in the HPI.  Past Medical History  Diagnosis Date  . Psychosexual dysfunction with inhibited sexual excitement   . Other and unspecified hyperlipidemia   . 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 Lesion of ulnar nerve   . External hemorrhoids without mention of complication   . Acute rheumatic pericarditis   . Acute rheumatic heart disease, unspecified     childhood, age  73 & 25  . 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 Atrial fibrillation     history  . Diverticulosis     Family History: Mother-stroke  Social History: Tob: denies  Exam: Current vital signs: BP 138/82  Pulse 78  Temp(Src) 98.1 F (36.7 C) (Oral)  Resp 17  Ht 5\' 8"  (1.727 m)  Wt 87.091 kg (192 lb)  BMI 29.20 kg/m2  SpO2 98% Vital signs in last 24 hours: Temp:  [98.1 F (36.7 C)] 98.1 F (36.7 C) (06/01 2037) Pulse Rate:  [71-78] 78 (06/01 2002) Resp:  [14-17] 17 (06/01 2002) BP: (131-152)/(82-109) 138/82 mmHg (06/01 2002) SpO2:  [98 %] 98 % (06/01  2002) Weight:  [87.091 kg (192 lb)] 87.091 kg (192 lb) (06/01 1929)  General: in bed, NAD CV: regular rate and rhythm Mental Status: Patient is awake, alert, oriented to person, place, month, year, and situation. Immediate and remote memory are intact. Patient is able to give a clear and coherent history. No signs of aphasia or neglect Cranial Nerves: II: Visual Fields are full. Pupils are equal, round, and reactive to light.  Discs are difficult to visualize. III,IV, VI: EOMI without ptosis or diploplia.  V: Facial sensation is symmetric to temperature VII: Facial movement is symmetric.  VIII: hearing is intact to voice X: Uvula elevates symmetrically XI: Shoulder shrug is symmetric. XII: tongue is midline without atrophy or fasciculations.  Motor: Tone is normal. Bulk is normal. 5/5 strength was present in all four extremities.  Sensory: Sensation is symmetric to light touch and temperature in the arms and legs. Deep Tendon Reflexes: 2+ and symmetric in the biceps and patellae.  Plantars: Toes are downgoing bilaterally.  Cerebellar: FNF intact bilaterally Gait: Not assessed due to  multiple medical monitors in ED setting.    I have reviewed labs in epic and the results pertinent to this consultation are: CMP-unremarkable  I have reviewed the images obtained:CT head-negative  Impression: 73 year old male with a history of atrial fibrillation who had TIA earlier tonight. Given this, I am concerned that atrial fibrillation is the source and anticoagulation is indicated. MRI can help  confirm no stroke to help guide timing of therapy.  He has had a left atrial appendage ligation in the past, but with valvular disease I am not certain if this is as beneficial as those with non-valvular atrial fibrillation. I would, however, pursue a workup to make sure there are no other clear potential sources of of stroke.  Recommendations: 1. HgbA1c, fasting lipid panel 2. MRI, MRA  of the  brain without contrast 3. Frequent neuro checks 4. Echocardiogram 5. Carotid dopplers 6. Prophylactic therapy-Antiplatelet med: Aspirin - dose 325mg  PO or 300mg  PR 7. Risk factor modification 8. Telemetry monitoring 9. PT consult, OT consult, Speech consult    Roland Rack, MD [facility] [phone]  If 7pm- 7am, please page neurology on call as listed in [place].

## 2013-08-05 NOTE — ED Provider Notes (Signed)
CSN: [id number]     Arrival date & time 08/05/13  1921 History   First MD Initiated Contact with Patient 08/05/13 1946     Chief Complaint  Patient presents with  . Transient Ischemic Attack     (Consider location/radiation/quality/duration/timing/severity/associated sxs/prior Treatment) HPI Comments: Patient is a 73 year old male with history of atrial fibrillation, atrial flutter, hyperlipidemia, acute traumatic pericarditis who presents today after having an episode of right-sided facial droop, aphasia, right-sided weakness. This began around 6:30 PM and lasted approximately 10 minutes. He was on the phone with his brother when this began. He states that he knew he was he wanted to say, but could not get it out. He had facial droop and right sided weakness. He currently does not have a deficit. He was taken off Coumadin one month ago. 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 He had was cardioverted in March. He has never had a stroke in the past.   The history is provided by the patient. No language interpreter was used.    Past Medical History  Diagnosis Date  . Psychosexual dysfunction with inhibited sexual excitement   . Other and unspecified hyperlipidemia   . 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 Lesion of ulnar nerve   . External hemorrhoids without mention of complication   . Acute rheumatic pericarditis   . Acute rheumatic heart disease, unspecified     childhood, age  87 & 28  . 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 Atrial fibrillation     history  . Diverticulosis    Past Surgical History  Procedure Laterality Date  . Laminectomies      10/1975 and in 03/2005  . 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 Cardioversion      3 times from 2004-2006  . Tonsillectomy      1950  . Colonoscopy     Family History  Problem Relation Age of Onset  . Stroke Mother   . Vision loss Mother     macular degeneration  . Cancer Father      intestinal/GI  . Diabetes Paternal Aunt    History  Substance Use Topics  . Smoking status: Never Smoker   . Smokeless tobacco: Never Used  . Alcohol Use: 4.2 oz/week    7 Glasses of wine per week     Comment: wine occasionally    Review of Systems  Constitutional: Negative for fever and chills.  Respiratory: Negative for shortness of breath.   Cardiovascular: Negative for chest pain.  Gastrointestinal: Negative for nausea, vomiting and abdominal pain.  Neurological: Positive for weakness, numbness and headaches.  All other systems reviewed and are negative.     Allergies  Naproxen  Home Medications   Prior to Admission medications   Medication Sig Start Date End Date Taking? Authorizing Provider  atorvastatin (LIPITOR) 80 MG tablet Take 1 tablet (80 mg total) by mouth daily. 02/04/13   Larey Dresser, MD  Bilberry 60 MG CAPS Take by mouth daily.    Historical Provider, MD  Coenzyme Q10-Vitamin E 100-300 MG-UNIT WAFR Take by mouth daily.    Historical Provider, MD  dronedarone (MULTAQ) 400 MG tablet Take 1 tablet (400 mg total) by mouth 2 (two) times daily with a meal. 02/04/13   Larey Dresser, MD  fish oil-omega-3 fatty acids 1000 MG capsule Take 3 g by mouth daily.  Historical Provider, MD  loratadine (CLARITIN) 10 MG tablet Take 10 mg by mouth as needed for allergies.    Historical Provider, MD  metoprolol succinate (TOPROL-XL) 50 MG 24 hr tablet Take 1 tablet (50 mg total) by mouth daily. Take with or immediately following a meal. 06/05/13   Deboraha Sprang, MD  multivitamin [facility]) per tablet Take 1 tablet by mouth daily.    Historical Provider, MD  Probiotic Product (PROBIOTIC FORMULA PO) Take 2 capsules by mouth daily.     Historical Provider, MD  pyridOXINE (VITAMIN B-6) 100 MG tablet Take 100 mg by mouth daily.    Historical Provider, MD  sildenafil (VIAGRA) 100 MG tablet Take 1 tablet (100 mg total) by mouth as needed. 08/01/12   Neena Rhymes, MD  Zn-Pyg  Afri-Nettle-Saw Palmet (SAW PALMETTO COMPLEX PO) Take by mouth daily.    Historical Provider, MD   BP 138/82  Pulse 78  Temp(Src) 98.1 F (36.7 C) (Oral)  Resp 17  Ht 5\' 8"  (1.727 m)  Wt 192 lb (87.091 kg)  BMI 29.20 kg/m2  SpO2 98% Physical Exam  Nursing note and vitals reviewed. Constitutional: He is oriented to person, place, and time. He appears well-developed and well-nourished. No distress.  HENT:  Head: Normocephalic and atraumatic.  Right Ear: External ear normal.  Left Ear: External ear normal.  Nose: Nose normal.  Eyes: Conjunctivae and EOM are normal. Pupils are equal, round, and reactive to light.  Neck: Normal range of motion. No spinous process tenderness and no muscular tenderness present. No tracheal deviation present.  Cardiovascular: Normal rate, normal heart sounds and intact distal pulses.  An irregularly irregular rhythm present.  Pulses:      Radial pulses are 2+ on the right side, and 2+ on the left side.       Posterior tibial pulses are 2+ on the right side, and 2+ on the left side.  Pulmonary/Chest: Effort normal and breath sounds normal. No stridor.  Abdominal: Soft. He exhibits no distension. There is no tenderness.  Musculoskeletal: Normal range of motion.  Strength 5/5 in all extremities  Neurological: He is alert and oriented to person, place, and time. He has normal strength. No sensory deficit. Coordination and gait normal. GCS eye subscore is 4. GCS verbal subscore is 5. GCS motor subscore is 6.  Finger nose finger normal. Rapid alternating movements normal. No facial droop.   Skin: Skin is warm and dry. He is not diaphoretic.  Psychiatric: He has a normal mood and affect. His behavior is normal.    ED Course  Procedures (including critical care time) Labs Review Labs Reviewed  ETHANOL  APTT  CBC  DIFFERENTIAL  COMPREHENSIVE METABOLIC PANEL  URINE RAPID DRUG SCREEN (HOSP PERFORMED)  URINALYSIS, ROUTINE W REFLEX MICROSCOPIC  PROTIME-INR   I-STAT CHEM 8, ED  I-STAT TROPOININ, ED  Randolm Idol, ED    Imaging Review Dg Chest 2 View  08/06/2013   CLINICAL DATA:  Stroke.  EXAM: CHEST  2 VIEW  COMPARISON:  07/27/2004  FINDINGS: Since prior study, cardiac surgery and mitral and aortic valve replacement has been performed heart is top-normal in size. No mediastinal or hilar masses or evidence of adenopathy.  Lungs are clear.  No pleural effusion.  No pneumothorax.  Bony thorax is intact.  IMPRESSION: No acute cardiopulmonary disease.   Electronically Signed   By: Lajean Manes M.D.   On: 08/06/2013 00:32   Ct Head Wo Contrast  08/05/2013   CLINICAL DATA:  Right-sided phase through pain. Difficulty getting words out. Some right arm numbness. Symptoms currently resolved.  EXAM: CT HEAD WITHOUT CONTRAST  TECHNIQUE: Contiguous axial images were obtained from the base of the skull through the vertex without intravenous contrast.  COMPARISON:  None.  FINDINGS: Ventricles are normal in configuration. There is ventricular and sulcal enlargement reflecting mild atrophy. No hydrocephalus.  There is a well-defined area of hypoattenuation in the left parietal lobe involving portions of the gray-white junction. This is not acute, but could reflect a subacute infarct. It may be chronic.  No other evidence of an infarct. No parenchymal masses or mass effect. Mild areas of white matter hypoattenuation are noted most consistent with chronic microvascular ischemic change.  No extra-axial masses or abnormal fluid collections.  No intracranial hemorrhage.  Visualized sinuses and mastoid air cells are clear. No skull lesion.  IMPRESSION: 1. 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 There is no evidence of an acute infarct. 2. Mild atrophy and chronic microvascular ischemic change. 3. No other abnormalities.   Electronically Signed   By: Lajean Manes M.D.   On: 08/05/2013 21:40     EKG Interpretation None      MDM   Final diagnoses:  TIA  (transient ischemic attack)    Patient presents to the emergency department after TIA. CT head shows left parietal infarct which appears subacute or chronic. The patient currently does not have a deficit on neuro exam. Dr. Leonel Ramsay from neurology evaluated the patient and recommends medicine admission. MRI ordered. Medicine to admit patient. Consultation and admission appreciated. Patient is hemodynamically stable. Dr. Stark Jock evaluated patient and agrees with plan. Patient / Family / Caregiver informed of clinical course, understand medical decision-making process, and agree with plan.     Elwyn Lade, PA-C 08/06/13 [phone]

## 2013-08-05 NOTE — ED Notes (Signed)
Per EMS, pt was eating dinner and went to make a phone call when he began having some difficulty getting his words out and felt like he was having some right arm numbness and felt like the right side of his face was drooping. Pt has no symptoms at this time. NAD at this time. Pt alert x 4.

## 2013-08-05 NOTE — ED Notes (Signed)
Neurology at the bedside

## 2013-08-05 NOTE — ED Notes (Signed)
Pt being monitored by pulse ox, blood pressure, and 5 lead.

## 2013-08-05 NOTE — Telephone Encounter (Signed)
New message  Pt called requests a call back to discuss private information that he would like to pass on Dr. Caryl Comes in preparation for his appt with Dr. [name] on June 5th. Experiencing atrial flutter.. Would like to pursue an appt wuth Dr. Boyd Kerbs as well at the [facility]. Please call back to assist

## 2013-08-06 ENCOUNTER — Inpatient Hospital Stay (HOSPITAL_COMMUNITY): Payer: Medicare Other

## 2013-08-06 DIAGNOSIS — I4892 Unspecified atrial flutter: Secondary | ICD-10-CM | POA: Diagnosis not present

## 2013-08-06 DIAGNOSIS — E785 Hyperlipidemia, unspecified: Secondary | ICD-10-CM | POA: Diagnosis not present

## 2013-08-06 DIAGNOSIS — I10 Essential (primary) hypertension: Secondary | ICD-10-CM | POA: Diagnosis not present

## 2013-08-06 DIAGNOSIS — I719 Aortic aneurysm of unspecified site, without rupture: Secondary | ICD-10-CM | POA: Diagnosis not present

## 2013-08-06 DIAGNOSIS — G459 Transient cerebral ischemic attack, unspecified: Secondary | ICD-10-CM | POA: Diagnosis not present

## 2013-08-06 DIAGNOSIS — I6350 Cerebral infarction due to unspecified occlusion or stenosis of unspecified cerebral artery: Secondary | ICD-10-CM | POA: Diagnosis not present

## 2013-08-06 DIAGNOSIS — I369 Nonrheumatic tricuspid valve disorder, unspecified: Secondary | ICD-10-CM

## 2013-08-06 LAB — LIPID PANEL
CHOLESTEROL: 114 mg/dL (ref 0–200)
HDL: 48 mg/dL (ref >39)
LDL Cholesterol: 54 mg/dL (ref 0–99)
Total CHOL/HDL Ratio: 2.4 RATIO
Triglycerides: 62 mg/dL (ref <150)
VLDL: 12 mg/dL (ref 0–40)

## 2013-08-06 LAB — CREATININE, SERUM
CREATININE: 0.95 mg/dL (ref 0.50–1.35)
GFR calc Af Amer: >90 mL/min (ref >90)
GFR calc non Af Amer: 82 mL/min — ABNORMAL LOW (ref >90)

## 2013-08-06 LAB — HEMOGLOBIN A1C
Hgb A1c MFr Bld: 5.9 % — ABNORMAL HIGH (ref <5.7)
MEAN PLASMA GLUCOSE: 123 mg/dL — AB (ref <117)

## 2013-08-06 MED ORDER — STROKE: EARLY STAGES OF RECOVERY BOOK
Freq: Once | Status: AC
  Administered 2013-08-06: 04:00:00
  Filled 2013-08-06 (×2): qty 1

## 2013-08-06 MED ORDER — GADOBENATE DIMEGLUMINE 529 MG/ML IV SOLN
20.0000 mL | Freq: Once | INTRAVENOUS | Status: AC
  Administered 2013-08-06: 18 mL via INTRAVENOUS

## 2013-08-06 MED ORDER — RANOLAZINE ER 500 MG PO TB12
500.0000 mg | ORAL_TABLET | Freq: Two times a day (BID) | ORAL | Status: DC
Start: 2013-08-06 — End: 2013-08-07
  Administered 2013-08-06 – 2013-08-07 (×2): 500 mg via ORAL
  Filled 2013-08-06 (×4): qty 1

## 2013-08-06 NOTE — Progress Notes (Signed)
PT Cancellation Note  Patient Details Name: FINIS HENDRICKSEN MRN: [id number] DOB: 05-11-1940   Cancelled Treatment:    Reason Eval/Treat Not Completed: Patient at procedure or test/unavailable. Pt at MRI, will re-attempt as able.   Angelissa Supan M Clydia Nieves 08/06/2013, 10:37 AM  Kittie Plater, PT, DPT Pager #: [phone] Office #: [phone]

## 2013-08-06 NOTE — Progress Notes (Signed)
SLP Cancellation Note  Patient Details Name: [name] [name] MRN: [id number] DOB: [birth year]   Cancelled treatment:       Reason Eval/Treat Not Completed: SLP screened, no needs identified, will sign off. Pt and wife report that he has returned to his baseline, and that all symptoms have resolved. Pt spoke with fluent speech, with no overt evidence of aphasia or dysarthria.     Germain Osgood, M.A. CCC-SLP [phone]  Germain Osgood 08/06/2013, 1:51 PM

## 2013-08-06 NOTE — Progress Notes (Signed)
[facility] PROGRESS NOTE  [name] [name] EPP:[id number] DOB: 06/22/1940 DOA: 08/05/2013 PCP: Scarlette Calico, MD  Assessment/Plan: 1. TIA vs CVA 1. MRI, carotid dopplers, 2d echo pending 2. Neurology following 3. PT/OT/SLP 4. On 325mg  aspirin 2. HTN 1. BP stable and controlled 2. Cont metoprolol 3. Aflutter 1. On ASA 4. HLD 5. DVT prophylaixs 1. On lovenox  Code Status: Full Family Communication: pt and family in room Disposition Plan: Pending  Consultants:  Neurology  Procedures:    Antibiotics:    HPI/Subjective: No complaints. Family reports questionable residual slurred speech. Denies weakness or numbness. Tolerating regular diet  Objective: Filed Vitals:   08/06/13 0346 08/06/13 0400 08/06/13 0616 08/06/13 0800  BP: 113/77 109/83 113/82 143/90  Pulse: 70 83 84 84  Temp: 98.6 F (37 C) 98.5 F (36.9 C) 98.6 F (37 C) 97.5 F (36.4 C)  TempSrc: Oral Oral Oral Oral  Resp: 20     Height:      Weight:      SpO2: 100% 97% 97% 98%    Intake/Output Summary (Last 24 hours) at 08/06/13 1000 Last data filed at 08/06/13 0700  Gross per 24 hour  Intake    360 ml  Output      0 ml  Net    360 ml   Filed Weights   08/05/13 1929 08/06/13 0033  Weight: 87.091 kg (192 lb) 88.179 kg (194 lb 6.4 oz)    Exam:   General:  Awake, in nad  Cardiovascular: regular, s1, s2  Respiratory: irregularly irregular, s1, s2  Abdomen: soft, nondistended, pos BS  Musculoskeletal: perfused, no clubbing  Neuro: 5/5 strength throughout, sensation intact   Data Reviewed: Basic Metabolic Panel:  Recent Labs Lab 08/05/13 2034 08/05/13 2041 08/05/13 2331  NA 143 146  --   K 4.3 4.1  --   CL 106 105  --   CO2 22  --   --   GLUCOSE 111* 104*  --   BUN 30* 29*  --   CREATININE 0.93 1.10 0.95  CALCIUM 9.1  --   --    Liver Function Tests:  Recent Labs Lab 08/05/13 0957 08/05/13 2034  AST 28 35  ALT 46 50  ALKPHOS 64 78  BILITOT 1.3* 0.5  PROT 6.6  7.1  ALBUMIN 3.8 3.9   No results found for this basename: LIPASE, AMYLASE,  in the last 168 hours No results found for this basename: AMMONIA,  in the last 168 hours CBC:  Recent Labs Lab 08/05/13 2034 08/05/13 2041 08/05/13 2331  WBC 9.5  --  9.3  NEUTROABS 5.7  --   --   HGB 13.7 15.0 13.2  HCT 40.3 44.0 38.3*  MCV 94.8  --  94.3  PLT 268  --  261   Cardiac Enzymes: No results found for this basename: CKTOTAL, CKMB, CKMBINDEX, TROPONINI,  in the last 168 hours BNP (last 3 results) No results found for this basename: PROBNP,  in the last 8760 hours CBG: No results found for this basename: GLUCAP,  in the last 168 hours  No results found for this or any previous visit (from the past 240 hour(s)).   Studies: Dg Chest 2 View  08/06/2013   CLINICAL DATA:  Stroke.  EXAM: CHEST  2 VIEW  COMPARISON:  07/27/2004  FINDINGS: Since prior study, cardiac surgery and mitral and aortic valve replacement has been performed heart is top-normal in size. No mediastinal or hilar masses or evidence of adenopathy.  Lungs are clear.  No pleural effusion.  No pneumothorax.  Bony thorax is intact.  IMPRESSION: No acute cardiopulmonary disease.   Electronically Signed   By: Lajean Manes M.D.   On: 08/06/2013 00:32   Ct Head Wo Contrast  08/05/2013   CLINICAL DATA:  Right-sided phase through pain. Difficulty getting words out. Some right arm numbness. Symptoms currently resolved.  EXAM: CT HEAD WITHOUT CONTRAST  TECHNIQUE: Contiguous axial images were obtained from the base of the skull through the vertex without intravenous contrast.  COMPARISON:  None.  FINDINGS: Ventricles are normal in configuration. There is ventricular and sulcal enlargement reflecting mild atrophy. No hydrocephalus.  There is a well-defined area of hypoattenuation in the left parietal lobe involving portions of the gray-white junction. This is not acute, but could reflect a subacute infarct. It may be chronic.  No other evidence of an  infarct. No parenchymal masses or mass effect. Mild areas of white matter hypoattenuation are noted most consistent with chronic microvascular ischemic change.  No extra-axial masses or abnormal fluid collections.  No intracranial hemorrhage.  Visualized sinuses and mastoid air cells are clear. No skull lesion.  IMPRESSION: 1. 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 There is no evidence of an acute infarct. 2. Mild atrophy and chronic microvascular ischemic change. 3. No other abnormalities.   Electronically Signed   By: Lajean Manes M.D.   On: 08/05/2013 21:40    Scheduled Meds: . aspirin  325 mg Oral Daily  . atorvastatin  80 mg Oral Daily  . dronedarone  400 mg Oral BID WC  . enoxaparin (LOVENOX) injection  40 mg Subcutaneous Daily  . metoprolol succinate  50 mg Oral Daily  . omega-3 acid ethyl esters  1,000 mg Oral BID  . pyridOXINE  100 mg Oral Daily   Continuous Infusions:   Principal Problem:   TIA (transient ischemic attack) Active Problems:   HYPERLIPIDEMIA-MIXED   Essential hypertension   Atrial flutter  Time spent: 95min  Stephen K Chiu  [facility] Pager [phone]. If 7PM-7AM, please contact night-coverage at [email or web address], password [facility] 08/06/2013, 10:00 AM  LOS: 1 day

## 2013-08-06 NOTE — Progress Notes (Signed)
UR complete.  Elaine Middleton RN, MSN 

## 2013-08-06 NOTE — Progress Notes (Signed)
  Echocardiogram 2D Echocardiogram has been performed.  Basilia Jumbo 08/06/2013, 11:49 AM

## 2013-08-06 NOTE — Progress Notes (Signed)
VASCULAR LAB PRELIMINARY  PRELIMINARY  PRELIMINARY  PRELIMINARY  Carotid duplex completed.    Preliminary report:  Bilateral:  1-39% ICA stenosis.  Vertebral artery flow is antegrade.     [place], [place] 08/06/2013, 12:29 PM

## 2013-08-06 NOTE — Progress Notes (Signed)
OT Cancellation Note  Patient Details Name: [name] [name] MRN: [id number] DOB: 11/07/1940   Cancelled Treatment:    Reason Eval/Treat Not Completed: OT screened, no needs identified, will sign off OT screening following discussion with PT.  Hortencia Pilar 08/06/2013, 4:02 PM

## 2013-08-06 NOTE — ED Provider Notes (Signed)
Medical screening examination/treatment/procedure(s) were conducted as a shared visit with non-physician practitioner(s) and myself.  I personally evaluated the patient during the encounter. Patient is a 73 year old male with history of atrial fibrillation who presents with complaints of right-sided facial droop, difficulty speaking, and weakness that occurred this evening. His symptoms lasted for approximately 10 minutes then completely resolved. He has never had an episode like this before. He denies any chest pain, difficulty breathing, fevers, chills.  On exam, vitals are stable and the patient is afebrile. Head is atraumatic, normocephalic. Neck is supple. Heart is irregularly irregular. There are no murmurs. Lungs are clear and equal. Neurologically, cranial nerves II through XII are grossly intact without focal deficits. Strength is 5 out of 5 in the bilateral upper and lower extremities.  Patient arrived for evaluation after a 10 minute episode of what appears to be an expressive aphasia and facial droop that are highly suggestive of a TIA. Workup thus far including laboratory studies and CT of the head are unremarkable. In consultation with Dr. Leonel Ramsay, the patient will be admitted to the hospitalist service to undergo MRI and completion of his TIA workup.   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

## 2013-08-06 NOTE — Progress Notes (Signed)
Pt admitted from the ED with a diagnosis of TIA, alert and oriented,denies any discomfort,pt settled in bed with call light within reach, v/s stable, will continue to monitor. Salvatrice Morandi Efe [place]

## 2013-08-06 NOTE — Progress Notes (Signed)
Report received from off-going RN, patient stable. Wife at the bedside.

## 2013-08-06 NOTE — Progress Notes (Signed)
OT Cancellation Note  Patient Details Name: [name] [name] MRN: [id number] DOB: 03-27-1940   Cancelled Treatment:    Reason Eval/Treat Not Completed: Patient at procedure or test/ unavailable. Will reattempt.  Hortencia Pilar 08/06/2013, 9:45 AM

## 2013-08-06 NOTE — Evaluation (Signed)
Physical Therapy Evaluation/Discharge Patient Details Name: [name] [name] MRN: [id number] DOB: 03-Dec-1940 Today's Date: 08/06/2013   History of Present Illness  73 y.o. male admitted to [facility] on 08/05/13 with confusion, right sided facial drooping and weakness.  MRI revealed small acute nonhemorrhagic left frontal lobe infarct, and Subacute left parietal lobe infarct.  Pt with significant PMhx inculding A-fib, mitral and aortic valve replacement, and HTN.  Clinical Impression  Pt is independent with all mobility.  All upper and lower extremity strength is WNL.  He reports no difficulty speaking or cognitively that he can notice.  No changes in vision or balance.  He scored a perfect score on DGI test for dynamic walking balance.  He has no current or f/u PT or OT needs at this time (PT screened for OT needs, none needed, informed OT and signed off).      Follow Up Recommendations No PT follow up    Equipment Recommendations  None recommended by PT    Recommendations for Other Services   NA    Precautions / Restrictions   None     Mobility  Bed Mobility Overal bed mobility: Independent                Transfers Overall transfer level: Independent Equipment used: None                Ambulation/Gait Ambulation/Gait assistance: Independent Ambulation Distance (Feet): 350 Feet Assistive device: None Gait Pattern/deviations: WFL(Within Functional Limits)   Gait velocity interpretation: at or above normal speed for age/gender    Stairs Stairs: Yes Stairs assistance: Independent Stair Management: No rails;Alternating pattern;Forwards Number of Stairs: 5     Modified Rankin (Stroke Patients Only) Modified Rankin (Stroke Patients Only) Pre-Morbid Rankin Score: No symptoms Modified Rankin: No symptoms     Balance Overall balance assessment: Independent                               Standardized Balance Assessment Standardized Balance Assessment :  Dynamic Gait Index   Dynamic Gait Index Level Surface: Normal Change in Gait Speed: Normal Gait with Horizontal Head Turns: Normal Gait with Vertical Head Turns: Normal Gait and Pivot Turn: Normal Step Over Obstacle: Normal Step Around Obstacles: Normal Steps: Normal Total Score: 24       Pertinent Vitals/Pain HR max 116 while walking with PT and monitor reporting A-fib.  RN made aware. Pt was asymptomatic.      Home Living Family/patient expects to be discharged to:: Private residence Living Arrangements: Spouse/significant other Available Help at Discharge: Family Type of Home: House Home Access: Stairs to enter Entrance Stairs-Rails: None Entrance Stairs-Number of Steps: 2 Home Layout: One level Home Equipment: None      Prior Function Level of Independence: Independent         Comments: drives, is a retired Psychologist, forensic.      Hand Dominance   Dominant Hand: Right    Extremity/Trunk Assessment   Upper Extremity Assessment: Overall WFL for tasks assessed (strenth, sensation, and coordination testing WNL)           Lower Extremity Assessment: Overall WFL for tasks assessed (strenth, sensation, and coordination testing WNL)      Cervical / Trunk Assessment: Normal  Communication   Communication: No difficulties  Cognition Arousal/Alertness: Awake/alert Behavior During Therapy: WFL for tasks assessed/performed Overall Cognitive Status: Within Functional Limits for tasks assessed  General Comments General comments (skin integrity, edema, etc.): Educated re: stroke signs and symptoms and importance of getting check out quickly at first signs of a stroke.  Pt/wife verbalized understanding.           Assessment/Plan    PT Assessment Patent does not need any further PT services           PT Goals (Current goals can be found in the Care Plan section) Acute Rehab PT Goals PT Goal Formulation: No goals set, d/c  therapy     End of Session   Activity Tolerance: Patient tolerated treatment well Patient left: in bed;with call bell/phone within reach;with family/visitor present Nurse Communication: Mobility status         Time: 1252-1307 PT Time Calculation (min): 15 min   Charges:   PT Evaluation $Initial PT Evaluation Tier I: 1 Procedure PT Treatments $Gait Training: 8-22 mins        Jairus Tonne B. Maury Groninger, PT, DPT [phone]   08/06/2013, 1:19 PM

## 2013-08-06 NOTE — Consult Note (Addendum)
CARDIOLOGY CONSULT NOTE  Patient ID: [name] [name] MRN: [id number] DOB/AGE: Jul 27, 1940 73 y.o.  Admit date: 08/05/2013 Primary Cardiologist: [name] Reason for Consultation: Atrial flutter, CVA  HPI: 73 yo with history of rheumatic fever and AI/[name] s/p bioprosthetic AVR and MVR at the [facility] as well as MAZE procedure all in 2006 was admitted with CVA. 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 I started him on dronedarone and coumadin with plan for cardioversion after 4 wks of therapeutic anticoagulation if he did not convert back to NSR on his own. He went back into NSR spontaneously and later stopped coumadin.  He again went into atypical atrial flutter in 3/15 and was started on coumadin in preparation for possible cardioversion.  He again went back into NSR and coumadin was stopped prior to a recent long vacation.   He was in his usual state of health until 08/05/13.  At 630p, he was talking with his brother when he had sudden onset of difficulty speaking with preserved understanding. He also had right facial droop and right-sided numbness. This completely resolved. Patient was not administered TPA as symptoms resolved.  MRI of head showed a left frontal lobe small infarct.  Patient was noted to be in atrial flutter at admission.   Review of systems complete and found to be negative unless listed above in HPI  Past Medical History: 1. Atrial fibrillation: s/p MAZE and LAA ligation in 2006. No documented atrial fibrillation since operation.  2. Atypical atrial flutter: 10/13 and again 3/15.  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 Last echo here in 5/11 with EF 60-65%, normal bioprosthetic aortic valve, normal bioprosthetic mitral valve, moderate LAE. Echo 11/12 at [facility] looked ok per report. Echo (10/13) with EF 55-60%, mild LVH, normal bioprosthetic aortic  and mitral valves, moderate LAE, PA systolic pressure 35 mmHg.  Echo (6/15): EF 55-60%, normal-appearing aortic and mitral bioprostheses, moderate to severe LAE.  4. Hyperlipidemia  5. L-spine surgery  6. LHC with no significant coronary disease in 2006 pre-op.    Family History  Problem Relation Age of Onset  . Stroke Mother   . Vision loss Mother     macular degeneration  . Cancer Father     intestinal/GI  . Diabetes Paternal Aunt     History   Social History  . Marital Status: Married    Spouse Name: N/A    Number of Children: N/A  . Years of Education: N/A   Occupational History  . Not on file.   Social History Main Topics  . Smoking status: Never Smoker   . Smokeless tobacco: Never Used  . Alcohol Use: 4.2 oz/week    7 Glasses of wine per week     Comment: wine occasionally  . Drug Use: No  . Sexual Activity: Yes    Partners: Female   Other Topics Concern  . Not on file   Social History Narrative   [name] - BS, [place]. married - '67. 2 sons - '74, '77  one son is a Nurse, learning disability for [employer]; 4 grandchildren. 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 marriage in good health. End-of-life: discussed issues and provided packet with the MOST form and out of facility order.     Prescriptions prior to admission  Medication Sig Dispense Refill  .  aspirin 325 MG tablet Take 325 mg by mouth daily.      [name] atorvastatin (LIPITOR) 80 MG tablet Take 1 tablet (80 mg total) by mouth daily.  90 tablet  3  . Bilberry 60 MG CAPS Take by mouth daily.      . Coenzyme Q10-Vitamin E 100-300 MG-UNIT WAFR Take by mouth daily.      . Cyanocobalamin (VITAMIN B 12 PO) Take 1 tablet by mouth daily.      [name] dronedarone (MULTAQ) 400 MG tablet Take 1 tablet (400 mg total) by mouth 2 (two) times daily with a meal.  180 tablet  1  . fish oil-omega-3 fatty acids 1000 MG capsule Take 3 g by mouth daily.      [name]  loratadine (CLARITIN) 10 MG tablet Take 10 mg by mouth as needed for allergies.      . metoprolol succinate (TOPROL-XL) 50 MG 24 hr tablet Take 1 tablet (50 mg total) by mouth daily. Take with or immediately following a meal.  30 tablet  6  . multivitamin (THERAGRAN) per tablet Take 1 tablet by mouth daily.      . Omega-3 Fatty Acids (FISH OIL) 1000 MG CAPS Take 1,000 mg by mouth 2 (two) times daily.      . Probiotic Product (PROBIOTIC FORMULA PO) Take 2 capsules by mouth daily.       [name] Zn-Pyg Afri-Nettle-Saw Palmet (SAW PALMETTO COMPLEX PO) Take by mouth daily.      . sildenafil (VIAGRA) 100 MG tablet Take 1 tablet (100 mg total) by mouth as needed.  10 tablet  6    Physical exam Blood pressure 118/74, pulse 71, temperature 97.8 F (36.6 C), temperature source Oral, resp. rate 18, height 5\' 8"  (1.727 m), weight 88.179 kg (194 lb 6.4 oz), SpO2 96.00%. General: NAD Neck: No JVD, no thyromegaly or thyroid nodule.  Lungs: Clear to auscultation bilaterally with normal respiratory effort. CV: Nondisplaced PMI.  Heart irregular S1/S2, no S3/S4, no murmur.  No peripheral edema.  No carotid bruit.  Normal pedal pulses.  Abdomen: Soft, nontender, no hepatosplenomegaly, no distention.  Skin: Intact without lesions or rashes.  Neurologic: Alert and oriented x 3.  Psych: Normal affect. Extremities: No clubbing or cyanosis.  HEENT: Normal.   Labs:   Lab Results  Component Value Date   WBC 9.3 08/05/2013   HGB 13.2 08/05/2013   HCT 38.3* 08/05/2013   MCV 94.3 08/05/2013   PLT 261 08/05/2013    Recent Labs Lab 08/05/13 2034 08/05/13 2041 08/05/13 2331  NA 143 146  --   K 4.3 4.1  --   CL 106 105  --   CO2 22  --   --   BUN 30* 29*  --   CREATININE 0.93 1.10 0.95  CALCIUM 9.1  --   --   PROT 7.1  --   --   BILITOT 0.5  --   --   ALKPHOS 78  --   --   ALT 50  --   --   AST 35  --   --   GLUCOSE 111* 104*  --    No results found for this basename: CKTOTAL, CKMB, CKMBINDEX, TROPONINI    Lab  Results  Component Value Date   CHOL 114 08/06/2013   CHOL 111 08/05/2013   CHOL 119 05/17/2012   Lab Results  Component Value Date   HDL 48 08/06/2013   HDL 48.20 08/05/2013   HDL 37.80* 05/17/2012  Lab Results  Component Value Date   LDLCALC 54 08/06/2013   LDLCALC 54 08/05/2013   LDLCALC 64 05/17/2012   Lab Results  Component Value Date   TRIG 62 08/06/2013   TRIG 42.0 08/05/2013   TRIG 88.0 05/17/2012   Lab Results  Component Value Date   CHOLHDL 2.4 08/06/2013   CHOLHDL 2 08/05/2013   CHOLHDL 3 05/17/2012   No results found for this basename: LDLDIRECT      Radiology:  - MRI head: small acute left frontal lobe infarct.   EKG: Atypical atrial flutter, otherwise normal.   ASSESSMENT AND PLAN:  73 yo with history of rheumatic fever and AI/[name] s/p bioprosthetic AVR and MVR at the [facility] as well as MAZE procedure all in 2006 was admitted with CVA. 1. Valvular heart disease: Echo today showed normal-appearing bioprosthetic aortic and mitral valves.  2. Atrial flutter, history of atrial fibrillation: Patient is status post MAZE as well as LA appendage ligation at the time of his initial operation.  He has had runs of atypical atrial flutter since that time.  Given the CVA this admission, he will need long-term anticoagulation.   - I recommend use of warfarin rather than NOAC in this situation.  [name] [name] has "valvular" atrial fibrillation.  He has two bioprosthetic valves.  Patients with prosthetic valves were excluded from the large NOAC trials.  There is no definite evidence other than extrapolation that they can be safely used.  As such, in absence of contraindication would use warfarin. If [name] [name] has a lot of trouble maintaining stable INR with warfarin, could envision switch to NOAC, but would reserve this as second line.  Given the lack of data here, I discussed the situation with Dr. Caryl Comes ([name] [name] electrophysiologist).  He suggests use of warfarin as well.  Would begin warfarin  when deemed safe by neurology, suspect soon.  - I will start Ranexa as adjunct antiarrhythmic to dronedarone.  If patient does not convert to NSR spontaneously, would plan DCCV after 4 weeks therapeutic INR on warfarin.  - We are looking into getting the patient an appointment again at the [facility] for evaluation for atypical atrial flutter ablation.   Larey Dresser 08/06/2013

## 2013-08-06 NOTE — Progress Notes (Signed)
Stroke Team Progress Note  HISTORY [name] [name] is a 73 y.o. 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 He has had the replacements, and has had intermittent atrial fibrillation most recently in March was planning on having DC cardioversion spontaneously reverted. His cardiologist noted that he had had a left atrial appendage ablation and therefore discontinued his Coumadin. He continues to be in and out of atrial fibrillation.  Tonight 08/05/2013 at 630p, he was talking with his brother when he had sudden onset of difficulty speaking with preserved understanding. He also had right facial droop and right-sided numbness. This completely resolved. Patient was not administered TPA secondary to symptoms resolved. He was admitted for further evaluation and treatment.  SUBJECTIVE His son and wife are at the bedside.  The patient was never in the room - we came 3 different times to see him. Dr. [name] did speak extensively to the son and wife.  OBJECTIVE Most recent Vital Signs: Filed Vitals:   08/06/13 0346 08/06/13 0400 08/06/13 0616 08/06/13 0800  BP: 113/77 109/83 113/82 143/90  Pulse: 70 83 84 84  Temp: 98.6 F (37 C) 98.5 F (36.9 C) 98.6 F (37 C) 97.5 F (36.4 C)  TempSrc: Oral Oral Oral Oral  Resp: 20     Height:      Weight:      SpO2: 100% 97% 97% 98%   CBG (last 3)  No results found for this basename: GLUCAP,  in the last 72 hours  IV Fluid Intake:     MEDICATIONS  . aspirin  325 mg Oral Daily  . atorvastatin  80 mg Oral Daily  . dronedarone  400 mg Oral BID WC  . enoxaparin (LOVENOX) injection  40 mg Subcutaneous Daily  . metoprolol succinate  50 mg Oral Daily  . omega-3 acid ethyl esters  1,000 mg Oral BID  . pyridOXINE  100 mg Oral Daily   PRN:  senna-docusate  Diet:  Cardiac thin liquids Activity:   DVT Prophylaxis:  Lovenox 40 mg sq daily   CLINICALLY SIGNIFICANT STUDIES Basic Metabolic  Panel:   Recent Labs Lab 08/05/13 2034 08/05/13 2041 08/05/13 2331  NA 143 146  --   K 4.3 4.1  --   CL 106 105  --   CO2 22  --   --   GLUCOSE 111* 104*  --   BUN 30* 29*  --   CREATININE 0.93 1.10 0.95  CALCIUM 9.1  --   --    Liver Function Tests:   Recent Labs Lab 08/05/13 0957 08/05/13 2034  AST 28 35  ALT 46 50  ALKPHOS 64 78  BILITOT 1.3* 0.5  PROT 6.6 7.1  ALBUMIN 3.8 3.9   CBC:   Recent Labs Lab 08/05/13 2034 08/05/13 2041 08/05/13 2331  WBC 9.5  --  9.3  NEUTROABS 5.7  --   --   HGB 13.7 15.0 13.2  HCT 40.3 44.0 38.3*  MCV 94.8  --  94.3  PLT 268  --  261   Coagulation:   Recent Labs Lab 08/05/13 2034  LABPROT 12.9  INR 0.99   Cardiac Enzymes: No results found for this basename: CKTOTAL, CKMB, CKMBINDEX, TROPONINI,  in the last 168 hours Urinalysis:   Recent Labs Lab 08/05/13 2207  COLORURINE YELLOW  LABSPEC 1.027  PHURINE 6.0  [place]  UROBILINOGEN 1.0  NITRITE NEGATIVE  LEUKOCYTESUR NEGATIVE   Lipid Panel    Component Value Date/Time   CHOL 114 08/06/2013 0647   TRIG 62 08/06/2013 0647   HDL 48 08/06/2013 0647   CHOLHDL 2.4 08/06/2013 0647   VLDL 12 08/06/2013 0647   LDLCALC 54 08/06/2013 0647   HgbA1C  No results found for this basename: HGBA1C    Urine Drug Screen:     Component Value Date/Time   LABOPIA NONE DETECTED 08/05/2013 2207   COCAINSCRNUR NONE DETECTED 08/05/2013 2207   LABBENZ NONE DETECTED 08/05/2013 2207   AMPHETMU NONE DETECTED 08/05/2013 2207   THCU NONE DETECTED 08/05/2013 2207   LABBARB NONE DETECTED 08/05/2013 2207    Alcohol Level:   Recent Labs Lab 08/05/13 2034  ETH <11    CT of the brain  08/05/2013   1. 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 There is no evidence of an acute infarct. 2. Mild atrophy and chronic microvascular ischemic change. 3. No other abnormalities.   MRI of the brain  08/06/2013     Scattered small acute nonhemorrhagic left frontal lobe infarct.  Subacute left parietal lobe infarct with laminar necrosis (less likely gyriform petechial hemorrhage).  Tiny punctate region of blood breakdown products posterior left temporal lobe may reflect result of hemorrhage ischemia without intracranial hemorrhage otherwise noted.  Mild to moderate small vessel disease type changes.  Mild gyriform enhancement of the subacute left parietal lobe infarct otherwise no evidence of intracranial enhancing lesion or mass identified.  Mild mucosal thickening inferior aspect right maxillary sinus.  Transverse ligament hypertrophy with minimal anterior slippage of the C1 ring and mild posterior displacement of the dens contributing to moderate spinal stenosis and cord flattening greater on the left.    MRA of the brain  08/06/2013     Small caliber A2 segment of the left anterior cerebral artery with majority of the A2 segment supplied from the right.  Anterior circulation without medium or large size vessel significant stenosis or occlusion.  Mild middle cerebral artery branch vessel irregularity greater on the left.  Right posterior inferior cerebellar artery and anterior inferior cerebellar artery have a single origin from the proximal basilar artery. Similar appearance on the left slightly distal to this level.  No significant stenosis of the distal vertebral arteries or basilar artery.  Minimal irregularity distal posterior cerebral artery branch vessels.    MRA of the neck 08/06/2013    Ectatic 3 vessel aortic arch.  Ectasia great vessels.  Mild narrowing proximal right subclavian artery.  Minimal narrowing proximal right vertebral artery. Mild ectasia proximal left vertebral artery. No significant stenosis of the vertebral arteries.  No significant stenosis of either carotid artery. Prominent and ectasia of the vertical segment of the internal carotid arteries bilaterally.   2D Echocardiogram  EF 55-60% with no  source of embolus.   Carotid Doppler  No evidence of hemodynamically significant internal carotid artery stenosis. Vertebral artery flow is antegrade.   CXR  08/06/2013   No acute cardiopulmonary disease.   EKG  Atrial flutter, rate 72For complete results please see formal report.   Therapy Recommendations    Physical Exam  **patient was not seen or examined. Case review as documented**   ASSESSMENT [name] is a 73 y.o. male presenting with right hemiparesis and difficulty speaking. 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 Infarcts felt to be embolic secondary to known atrial fibrillation.  On aspirin 325 mg orally every day  prior to admission. Now on aspirin 325 mg orally every day for secondary stroke prevention. Stroke work up underway.  atrial fibrillation, spontaneously revered to normal sinus rhythm. Has had left atrial appendage ablation, therefore, cardiology discontinued his coumadin Acute rheumatic heart disease, unspecified, childhood age 44&8 s/p bioprosthetic valve replacements Hyperlipidemia, LDL 54, on omega 3s PTA, now on no statin, at goal LDL < 100. Statin not indicated given normal LDL.    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 Recommend change to NOAC for secondary stroke prevention  Will follow up tomorrow with patient   SIGNED Burnetta Sabin, MSN, RN, ANVP-BC, ANP-BC, GNP-BC [facility] Pager: [phone] 08/06/2013 6:17 PM   I have personally obtained a history,  , evaluated imaging results, and formulated the assessment and plan of care. I agree with the above. Antony Contras, MD   To contact Stroke Continuity provider, please refer to [email or web address] After hours, contact General Neurology

## 2013-08-07 DIAGNOSIS — I6340 Cerebral infarction due to embolism of unspecified cerebral artery: Secondary | ICD-10-CM | POA: Diagnosis not present

## 2013-08-07 DIAGNOSIS — I10 Essential (primary) hypertension: Secondary | ICD-10-CM | POA: Diagnosis not present

## 2013-08-07 DIAGNOSIS — G459 Transient cerebral ischemic attack, unspecified: Secondary | ICD-10-CM | POA: Diagnosis not present

## 2013-08-07 DIAGNOSIS — Z7901 Long term (current) use of anticoagulants: Secondary | ICD-10-CM

## 2013-08-07 DIAGNOSIS — I4892 Unspecified atrial flutter: Secondary | ICD-10-CM | POA: Diagnosis not present

## 2013-08-07 MED ORDER — RANOLAZINE ER 500 MG PO TB12: 500.0000 mg | ORAL_TABLET | Freq: Two times a day (BID) | ORAL | Status: DC

## 2013-08-07 MED ORDER — ASPIRIN 325 MG PO TABS: 325.0000 mg | ORAL_TABLET | Freq: Every day | ORAL | Status: DC

## 2013-08-07 MED ORDER — WARFARIN SODIUM 7.5 MG PO TABS: 7.5000 mg | ORAL_TABLET | Freq: Every day | ORAL | Status: DC

## 2013-08-07 NOTE — Progress Notes (Signed)
Patient d/ced this afternoon. Walked out accompanied by his wife. Assessments remained unchanged prior to discharge.

## 2013-08-07 NOTE — Progress Notes (Signed)
Patient ID: [name] [name], male   DOB: 1940/10/12, 73 y.o.   MRN: [id number]   SUBJECTIVE: Patient is doing well this morning, no residual neurological signs/symptoms.  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   Scheduled Meds: . aspirin  325 mg Oral Daily  . atorvastatin  80 mg Oral Daily  . dronedarone  400 mg Oral BID WC  . enoxaparin (LOVENOX) injection  40 mg Subcutaneous Daily  . metoprolol succinate  50 mg Oral Daily  . omega-3 acid ethyl esters  1,000 mg Oral BID  . pyridOXINE  100 mg Oral Daily  . ranolazine  500 mg Oral BID   Continuous Infusions:  PRN Meds:.senna-docusate    Filed Vitals:   08/06/13 1405 08/06/13 1745 08/06/13 2100 08/07/13 0525  BP: 125/82 118/74 121/73 126/70  Pulse: 71 71 88 82  Temp: 97.1 F (36.2 C) 97.8 F (36.6 C) 98 F (36.7 C) 97.7 F (36.5 C)  TempSrc: Oral Oral Oral Oral  Resp: 18 18 20 20   Height:      Weight:      SpO2: 96% 96% 95% 98%    Intake/Output Summary (Last 24 hours) at 08/07/13 0847 Last data filed at 08/06/13 1300  Gross per 24 hour  Intake    360 ml  Output      0 ml  Net    360 ml    LABS: Basic Metabolic Panel:  Recent Labs  08/05/13 2034 08/05/13 2041 08/05/13 2331  NA 143 146  --   K 4.3 4.1  --   CL 106 105  --   CO2 22  --   --   GLUCOSE 111* 104*  --   BUN 30* 29*  --   CREATININE 0.93 1.10 0.95  CALCIUM 9.1  --   --    Liver Function Tests:  Recent Labs  08/05/13 0957 08/05/13 2034  AST 28 35  ALT 46 50  ALKPHOS 64 78  BILITOT 1.3* 0.5  PROT 6.6 7.1  ALBUMIN 3.8 3.9   No results found for this basename: LIPASE, AMYLASE,  in the last 72 hours CBC:  Recent Labs  08/05/13 2034 08/05/13 2041 08/05/13 2331  WBC 9.5  --  9.3  NEUTROABS 5.7  --   --   HGB 13.7 15.0 13.2  HCT 40.3 44.0 38.3*  MCV 94.8  --  94.3  PLT 268  --  261   Cardiac Enzymes: No results found for this basename: CKTOTAL, CKMB, CKMBINDEX, TROPONINI,  in the last 72 hours BNP: No components found with this  basename: POCBNP,  D-Dimer: No results found for this basename: DDIMER,  in the last 72 hours Hemoglobin A1C:  Recent Labs  08/06/13 0647  HGBA1C 5.9*   Fasting Lipid Panel:  Recent Labs  08/06/13 0647  CHOL 114  HDL 48  LDLCALC 54  TRIG 62  CHOLHDL 2.4   Thyroid Function Tests: No results found for this basename: TSH, T4TOTAL, FREET3, T3FREE, THYROIDAB,  in the last 72 hours Anemia Panel: No results found for this basename: VITAMINB12, FOLATE, FERRITIN, TIBC, IRON, RETICCTPCT,  in the last 72 hours  RADIOLOGY: Dg Chest 2 View  08/06/2013   CLINICAL DATA:  Stroke.  EXAM: CHEST  2 VIEW  COMPARISON:  07/27/2004  FINDINGS: Since prior study, cardiac surgery and mitral and aortic valve replacement has been performed heart is top-normal in size. No mediastinal or hilar masses or evidence of adenopathy.  Lungs are clear.  No pleural  effusion.  No pneumothorax.  Bony thorax is intact.  IMPRESSION: No acute cardiopulmonary disease.   Electronically Signed   By: Lajean Manes M.D.   On: 08/06/2013 00:32   Ct Head Wo Contrast  08/05/2013   CLINICAL DATA:  Right-sided phase through pain. Difficulty getting words out. Some right arm numbness. Symptoms currently resolved.  EXAM: CT HEAD WITHOUT CONTRAST  TECHNIQUE: Contiguous axial images were obtained from the base of the skull through the vertex without intravenous contrast.  COMPARISON:  None.  FINDINGS: Ventricles are normal in configuration. There is ventricular and sulcal enlargement reflecting mild atrophy. No hydrocephalus.  There is a well-defined area of hypoattenuation in the left parietal lobe involving portions of the gray-white junction. This is not acute, but could reflect a subacute infarct. It may be chronic.  No other evidence of an infarct. No parenchymal masses or mass effect. Mild areas of white matter hypoattenuation are noted most consistent with chronic microvascular ischemic change.  No extra-axial masses or abnormal fluid  collections.  No intracranial hemorrhage.  Visualized sinuses and mastoid air cells are clear. No skull lesion.  IMPRESSION: 1. 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 There is no evidence of an acute infarct. 2. Mild atrophy and chronic microvascular ischemic change. 3. No other abnormalities.   Electronically Signed   By: Lajean Manes M.D.   On: 08/05/2013 21:40   Mr Angiogram Neck W Wo Contrast  08/06/2013   CLINICAL DATA:  73 year old male with history of rheumatic heart disease and atrial fibrillation post mitral and aortic valve replacement. Sudden onset of difficulty speaking and right facial droop with right-sided numbness status completely resolved.  MRI HEAD WITHOUT AND WITH CONTRAST AND MRA HEAD WITH CONTRAST AND MRI NECK WITHOUT AND WITH CONTRAST  TECHNIQUE: Multiplanar, multiecho pulse sequences of the brain and surrounding structures were obtained without and with intravenous contrast. Angiographic images of the head were obtained using MRA technique without contrast. Multiplanar, multiecho pulse sequences of the neck and surrounding structures were obtained without and with intravenous contrast.  CONTRAST:  57mL MULTIHANCE GADOBENATE DIMEGLUMINE 529 MG/ML IV SOLN  COMPARISON:  08/05/2013 head CT.  No comparison MR.  FINDINGS: MRI HEAD FINDINGS  Scattered small acute nonhemorrhagic left frontal lobe infarct.  Subacute left parietal lobe infarct with laminar necrosis (less likely gyriform petechial hemorrhage).  Tiny punctate region of blood breakdown products posterior left temporal lobe may reflect result of hemorrhage ischemia without intracranial hemorrhage otherwise noted.  Mild to moderate small vessel disease type changes.  Mild gyriform enhancement of the subacute left parietal lobe infarct otherwise no evidence of intracranial enhancing lesion or mass identified.  No hydrocephalus.  Major intracranial vascular structures are patent.  Mild mucosal thickening inferior aspect  right maxillary sinus.  Transverse ligament hypertrophy with minimal anterior slippage of the C1 ring and mild posterior displacement of the dens contributing to moderate spinal stenosis and cord flattening greater on the left.  MRA HEAD FINDINGS  Small caliber A2 segment of the left anterior cerebral artery with majority of the A2 segment supplied from the right.  Anterior circulation without medium or large size vessel significant stenosis or occlusion.  Mild middle cerebral artery branch vessel irregularity.  Right posterior inferior cerebellar artery and anterior inferior cerebellar artery have a single origin from the proximal basilar artery. Similar appearance on the left slightly distal to this level.  No significant stenosis of the distal vertebral arteries or basilar artery.  Minimal irregularity distal posterior cerebral  artery branch vessels.  No aneurysm noted.  MRI NECK FINDINGS  Ectatic 3 vessel aortic arch.  Ectasia great vessels.  Mild narrowing proximal right subclavian artery.  Minimal narrowing proximal right vertebral artery. Mild ectasia proximal left vertebral artery. No significant stenosis of the vertebral arteries.  No significant stenosis of either carotid artery. Prominent and ectasia of the vertical segment of the internal carotid arteries bilaterally.  IMPRESSION: MRI HEAD:  Scattered small acute nonhemorrhagic left frontal lobe infarct.  Subacute left parietal lobe infarct with laminar necrosis (less likely gyriform petechial hemorrhage).  Tiny punctate region of blood breakdown products posterior left temporal lobe may reflect result of hemorrhage ischemia without intracranial hemorrhage otherwise noted.  Mild to moderate small vessel disease type changes.  Mild gyriform enhancement of the subacute left parietal lobe infarct otherwise no evidence of intracranial enhancing lesion or mass identified.  Mild mucosal thickening inferior aspect right maxillary sinus.  Transverse ligament  hypertrophy with minimal anterior slippage of the C1 ring and mild posterior displacement of the dens contributing to moderate spinal stenosis and cord flattening greater on the left.  MRA HEAD:  Small caliber A2 segment of the left anterior cerebral artery with majority of the A2 segment supplied from the right.  Anterior circulation without medium or large size vessel significant stenosis or occlusion.  Mild middle cerebral artery branch vessel irregularity greater on the left.  Right posterior inferior cerebellar artery and anterior inferior cerebellar artery have a single origin from the proximal basilar artery. Similar appearance on the left slightly distal to this level.  No significant stenosis of the distal vertebral arteries or basilar artery.  Minimal irregularity distal posterior cerebral artery branch vessels.  MRI NECK :  Ectatic 3 vessel aortic arch.  Ectasia great vessels.  Mild narrowing proximal right subclavian artery.  Minimal narrowing proximal right vertebral artery. Mild ectasia proximal left vertebral artery. No significant stenosis of the vertebral arteries.  No significant stenosis of either carotid artery. Prominent and ectasia of the vertical segment of the internal carotid arteries bilaterally.   Electronically Signed   By: Chauncey Cruel M.D.   On: 08/06/2013 11:39   Mr Jeri Cos GU Contrast  08/06/2013   CLINICAL DATA:  73 year old male with history of rheumatic heart disease and atrial fibrillation post mitral and aortic valve replacement. Sudden onset of difficulty speaking and right facial droop with right-sided numbness status completely resolved.  MRI HEAD WITHOUT AND WITH CONTRAST AND MRA HEAD WITH CONTRAST AND MRI NECK WITHOUT AND WITH CONTRAST  TECHNIQUE: Multiplanar, multiecho pulse sequences of the brain and surrounding structures were obtained without and with intravenous contrast. Angiographic images of the head were obtained using MRA technique without contrast. Multiplanar,  multiecho pulse sequences of the neck and surrounding structures were obtained without and with intravenous contrast.  CONTRAST:  58mL MULTIHANCE GADOBENATE DIMEGLUMINE 529 MG/ML IV SOLN  COMPARISON:  08/05/2013 head CT.  No comparison MR.  FINDINGS: MRI HEAD FINDINGS  Scattered small acute nonhemorrhagic left frontal lobe infarct.  Subacute left parietal lobe infarct with laminar necrosis (less likely gyriform petechial hemorrhage).  Tiny punctate region of blood breakdown products posterior left temporal lobe may reflect result of hemorrhage ischemia without intracranial hemorrhage otherwise noted.  Mild to moderate small vessel disease type changes.  Mild gyriform enhancement of the subacute left parietal lobe infarct otherwise no evidence of intracranial enhancing lesion or mass identified.  No hydrocephalus.  Major intracranial vascular structures are patent.  Mild mucosal thickening inferior aspect  right maxillary sinus.  Transverse ligament hypertrophy with minimal anterior slippage of the C1 ring and mild posterior displacement of the dens contributing to moderate spinal stenosis and cord flattening greater on the left.  MRA HEAD FINDINGS  Small caliber A2 segment of the left anterior cerebral artery with majority of the A2 segment supplied from the right.  Anterior circulation without medium or large size vessel significant stenosis or occlusion.  Mild middle cerebral artery branch vessel irregularity.  Right posterior inferior cerebellar artery and anterior inferior cerebellar artery have a single origin from the proximal basilar artery. Similar appearance on the left slightly distal to this level.  No significant stenosis of the distal vertebral arteries or basilar artery.  Minimal irregularity distal posterior cerebral artery branch vessels.  No aneurysm noted.  MRI NECK FINDINGS  Ectatic 3 vessel aortic arch.  Ectasia great vessels.  Mild narrowing proximal right subclavian artery.  Minimal narrowing  proximal right vertebral artery. Mild ectasia proximal left vertebral artery. No significant stenosis of the vertebral arteries.  No significant stenosis of either carotid artery. Prominent and ectasia of the vertical segment of the internal carotid arteries bilaterally.  IMPRESSION: MRI HEAD:  Scattered small acute nonhemorrhagic left frontal lobe infarct.  Subacute left parietal lobe infarct with laminar necrosis (less likely gyriform petechial hemorrhage).  Tiny punctate region of blood breakdown products posterior left temporal lobe may reflect result of hemorrhage ischemia without intracranial hemorrhage otherwise noted.  Mild to moderate small vessel disease type changes.  Mild gyriform enhancement of the subacute left parietal lobe infarct otherwise no evidence of intracranial enhancing lesion or mass identified.  Mild mucosal thickening inferior aspect right maxillary sinus.  Transverse ligament hypertrophy with minimal anterior slippage of the C1 ring and mild posterior displacement of the dens contributing to moderate spinal stenosis and cord flattening greater on the left.  MRA HEAD:  Small caliber A2 segment of the left anterior cerebral artery with majority of the A2 segment supplied from the right.  Anterior circulation without medium or large size vessel significant stenosis or occlusion.  Mild middle cerebral artery branch vessel irregularity greater on the left.  Right posterior inferior cerebellar artery and anterior inferior cerebellar artery have a single origin from the proximal basilar artery. Similar appearance on the left slightly distal to this level.  No significant stenosis of the distal vertebral arteries or basilar artery.  Minimal irregularity distal posterior cerebral artery branch vessels.  MRI NECK :  Ectatic 3 vessel aortic arch.  Ectasia great vessels.  Mild narrowing proximal right subclavian artery.  Minimal narrowing proximal right vertebral artery. Mild ectasia proximal left  vertebral artery. No significant stenosis of the vertebral arteries.  No significant stenosis of either carotid artery. Prominent and ectasia of the vertical segment of the internal carotid arteries bilaterally.   Electronically Signed   By: Chauncey Cruel M.D.   On: 08/06/2013 11:39   Mr Mra Head/brain Wo Cm  08/06/2013   CLINICAL DATA:  73 year old male with history of rheumatic heart disease and atrial fibrillation post mitral and aortic valve replacement. Sudden onset of difficulty speaking and right facial droop with right-sided numbness status completely resolved.  MRI HEAD WITHOUT AND WITH CONTRAST AND MRA HEAD WITH CONTRAST AND MRI NECK WITHOUT AND WITH CONTRAST  TECHNIQUE: Multiplanar, multiecho pulse sequences of the brain and surrounding structures were obtained without and with intravenous contrast. Angiographic images of the head were obtained using MRA technique without contrast. Multiplanar, multiecho pulse sequences of the neck  and surrounding structures were obtained without and with intravenous contrast.  CONTRAST:  57mL MULTIHANCE GADOBENATE DIMEGLUMINE 529 MG/ML IV SOLN  COMPARISON:  08/05/2013 head CT.  No comparison MR.  FINDINGS: MRI HEAD FINDINGS  Scattered small acute nonhemorrhagic left frontal lobe infarct.  Subacute left parietal lobe infarct with laminar necrosis (less likely gyriform petechial hemorrhage).  Tiny punctate region of blood breakdown products posterior left temporal lobe may reflect result of hemorrhage ischemia without intracranial hemorrhage otherwise noted.  Mild to moderate small vessel disease type changes.  Mild gyriform enhancement of the subacute left parietal lobe infarct otherwise no evidence of intracranial enhancing lesion or mass identified.  No hydrocephalus.  Major intracranial vascular structures are patent.  Mild mucosal thickening inferior aspect right maxillary sinus.  Transverse ligament hypertrophy with minimal anterior slippage of the C1 ring and mild  posterior displacement of the dens contributing to moderate spinal stenosis and cord flattening greater on the left.  MRA HEAD FINDINGS  Small caliber A2 segment of the left anterior cerebral artery with majority of the A2 segment supplied from the right.  Anterior circulation without medium or large size vessel significant stenosis or occlusion.  Mild middle cerebral artery branch vessel irregularity.  Right posterior inferior cerebellar artery and anterior inferior cerebellar artery have a single origin from the proximal basilar artery. Similar appearance on the left slightly distal to this level.  No significant stenosis of the distal vertebral arteries or basilar artery.  Minimal irregularity distal posterior cerebral artery branch vessels.  No aneurysm noted.  MRI NECK FINDINGS  Ectatic 3 vessel aortic arch.  Ectasia great vessels.  Mild narrowing proximal right subclavian artery.  Minimal narrowing proximal right vertebral artery. Mild ectasia proximal left vertebral artery. No significant stenosis of the vertebral arteries.  No significant stenosis of either carotid artery. Prominent and ectasia of the vertical segment of the internal carotid arteries bilaterally.  IMPRESSION: MRI HEAD:  Scattered small acute nonhemorrhagic left frontal lobe infarct.  Subacute left parietal lobe infarct with laminar necrosis (less likely gyriform petechial hemorrhage).  Tiny punctate region of blood breakdown products posterior left temporal lobe may reflect result of hemorrhage ischemia without intracranial hemorrhage otherwise noted.  Mild to moderate small vessel disease type changes.  Mild gyriform enhancement of the subacute left parietal lobe infarct otherwise no evidence of intracranial enhancing lesion or mass identified.  Mild mucosal thickening inferior aspect right maxillary sinus.  Transverse ligament hypertrophy with minimal anterior slippage of the C1 ring and mild posterior displacement of the dens contributing  to moderate spinal stenosis and cord flattening greater on the left.  MRA HEAD:  Small caliber A2 segment of the left anterior cerebral artery with majority of the A2 segment supplied from the right.  Anterior circulation without medium or large size vessel significant stenosis or occlusion.  Mild middle cerebral artery branch vessel irregularity greater on the left.  Right posterior inferior cerebellar artery and anterior inferior cerebellar artery have a single origin from the proximal basilar artery. Similar appearance on the left slightly distal to this level.  No significant stenosis of the distal vertebral arteries or basilar artery.  Minimal irregularity distal posterior cerebral artery branch vessels.  MRI NECK :  Ectatic 3 vessel aortic arch.  Ectasia great vessels.  Mild narrowing proximal right subclavian artery.  Minimal narrowing proximal right vertebral artery. Mild ectasia proximal left vertebral artery. No significant stenosis of the vertebral arteries.  No significant stenosis of either carotid artery. Prominent and ectasia of  the vertical segment of the internal carotid arteries bilaterally.   Electronically Signed   By: Chauncey Cruel M.D.   On: 08/06/2013 11:39    PHYSICAL EXAM General: NAD Neck: No JVD, no thyromegaly or thyroid nodule.  Lungs: Clear to auscultation bilaterally with normal respiratory effort. CV: Nondisplaced PMI.  Heart irregular S1/S2, no S3/S4, no murmur.  No peripheral edema.  No carotid bruit.  Normal pedal pulses.  Abdomen: Soft, nontender, no hepatosplenomegaly, no distention.  Neurologic: Alert and oriented x 3.  Psych: Normal affect. Extremities: No clubbing or cyanosis.   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  1. Valvular heart disease: Echo yesterday  showed normal-appearing bioprosthetic aortic and mitral valves.  2. Atrial flutter, history of atrial fibrillation: Patient is status post MAZE as well as LA appendage ligation at the time of his initial operation. He has had runs of atypical atrial flutter since that time. Given the CVA this admission, he will need long-term anticoagulation.  - I recommend use of warfarin rather than NOAC in this situation. Mr Ra has "valvular" atrial fibrillation. He has two bioprosthetic valves. Patients with prosthetic valves were excluded from the large NOAC trials. There is no definite evidence other than extrapolation that they can be safely used. As such, in absence of contraindication would use warfarin. If Mr Stepanek has a lot of trouble maintaining stable INR with warfarin, could envision switch to NOAC, but would reserve this as second line. Given the lack of data here, I discussed the situation with Dr. Caryl Comes (Mr [facility] electrophysiologist). He suggests use of warfarin as well. Would begin warfarin when deemed safe by neurology, suspect he could start today.  - I will start Ranexa as adjunct antiarrhythmic to dronedarone. If patient does not convert to NSR spontaneously, would plan DCCV after 4 weeks therapeutic INR on warfarin.  - We are looking into getting the patient an appointment again at the [facility] for evaluation for atypical atrial flutter ablation.  3. Disposition: I suspect he can go home today.  I am arranging followup in our coumadin clinic and with me in 2 wks.   Larey Dresser 08/07/2013 8:49 AM

## 2013-08-07 NOTE — Discharge Summary (Signed)
Physician Discharge Summary  [name] [name] [id number] DOB: 1940/08/08 DOA: 08/05/2013  PCP: Scarlette Calico, MD  Admit date: 08/05/2013 Discharge date: 08/07/2013  Time spent: 35 minutes  Recommendations for Outpatient Follow-up:  1. Patient discharged on anticoagulation therapy with Coumadin. 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 year old male with prior hx/o Afib s/p ablative procedure as well as mitral and aortic valve replacement (bioprosthetic), HTN, presenting with TIA. Pt recently [place] from [place]. States that he was having conversation with his brother over the phone when he became acutely confused with R sided facial droop and weakness. Sxs lasted approx 10-15 mins and then self resolved. Pt noted have been recently in and out of afib/aflutter. Has been on antiarrhythmic dronedarone as well as metoprolol for management. Was supposed to be started on coumadin for anticoagulation w/in last 3 months, however was unable to be started because of monitoring as pt has been on several trips. Pt denies any CP, SOB, LE pain  On presentation, neuro sxs resolved. BPs 120s-150s/80s-100s. Labwork otherwise WNL. Head CT with a left parietal infarct that appeared to be subacute or chronic. No evidence of acute infarct. Initial fibrillation with 4-1 block on EKG. Neurology consult. Recommend medical admission.  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 Neurologic symptoms resolving within 24 hours. Patient was seen and evaluated by neurology who recommended continuing aspirin 325 mg by mouth daily. Neurology also recommended initiating anticoagulation therapy given history of A. fib. He was initially worked up with a CT scan of the brain without contrast which showed left parietal infarct appears to be subacute to chronic. There is no evidence of acute infarct. MRI of brain shows scattered small acute nonhemorrhagic left frontal lobe infarct, subacute left parietal lobe infarct with laminar necrosis, tiny punctate region of blood breakdown posterior left temporal lobe which could reflect results of hemorrhage ischemia without intracranial hemorrhage. During this hospitalization he was evaluated by cardiology who recommended utilization of warfarin rather than novel anticoagulants. He was also started on Ranexa as adjunct therapy. Given patient's clinical stability and resolution of neurologic symptoms he was discharged in stable condition on 08/07/2013. Case discussed with Dr. [name] of neurology who agreed with discharge. He was provided with a prescription for Coumadin instructed to followup at the Coumadin clinic in 2 days for a PT/INR check.  Procedures:  Transthoracic echocardiogram performed and 62,015 showed an ejection fraction of 55-60%. Normal appearing AV bile prosthesis, normal appearing mitral bioprosthesis.  Consultations:  Neurology  Cardiology  Discharge Exam: Filed Vitals:   08/07/13 1000  BP: 138/96  Pulse: 80  Temp: 97.9 F (36.6 C)  Resp: 18    General: Patient is awake alert oriented x3 in no acute distress Cardiovascular: Regular rate and rhythm normal S1-S2 Respiratory: Clear to auscultation bilaterally Neurological: Does  not have facial droop or  slurred speech, cranial nerves 2-12 are grossly intact, 5 of 5 muscle strength globally  Discharge Instructions You were cared for by a hospitalist during your hospital stay. If you have any questions about your discharge medications or the care you received while you were in the hospital after you are discharged, you can call the unit and asked to speak with the hospitalist on call if the hospitalist that took care of you is not available. Once you are discharged, your primary care physician will handle any further medical issues. Please note that NO REFILLS for any discharge medications will be authorized once you are discharged, as it is imperative that you return to your primary care physician (or establish a relationship with a primary care physician if you do not have one) for your aftercare needs so that they can reassess your need for medications and monitor your lab values.  Discharge Instructions   Call MD for:  difficulty breathing, headache or visual disturbances    Complete by:  As directed      Call MD for:  extreme fatigue    Complete by:  As directed      Call MD for:  hives    Complete by:  As directed      Call MD for:  persistant dizziness or light-headedness    Complete by:  As directed      Call MD for:  persistant nausea and vomiting    Complete by:  As directed      Call MD for:  redness, tenderness, or signs of infection (pain, swelling, redness, odor or green/yellow discharge around incision site)    Complete by:  As directed      Call MD for:  severe uncontrolled pain    Complete by:  As directed      Call MD for:  temperature >100.4    Complete by:  As directed      Diet - low sodium heart healthy    Complete by:  As directed      Discharge instructions    Complete by:  As directed   You are being discharged on Coumadin which will require period checks of the drug levels. Please follow up with the Coumadin Clinic at [facility] in 2 days to have your INR  checked. They will indicate any changes in dosage.     Increase activity slowly    Complete by:  As directed             Medication List    STOP taking these medications       SAW PALMETTO COMPLEX PO      TAKE these medications       aspirin 325 MG tablet  Take 1 tablet (325 mg total) by mouth daily.     atorvastatin 80 MG tablet  Commonly known as:  LIPITOR  Take 1 tablet (80 mg total) by mouth daily.     Bilberry 60 MG Caps  Take by mouth daily.     Coenzyme Q10-Vitamin E 100-300 MG-UNIT Wafr  Take by mouth daily.     dronedarone 400 MG tablet  Commonly known as:  MULTAQ  Take 1 tablet (400 mg total) by mouth 2 (two) times daily with a meal.     Fish Oil 1000 MG Caps  Take 1,000 mg by mouth 2 (two) times daily.     fish oil-omega-3 fatty acids 1000 MG capsule  Take 3 g by  mouth daily.     loratadine 10 MG tablet  Commonly known as:  CLARITIN  Take 10 mg by mouth as needed for allergies.     metoprolol succinate 50 MG 24 hr tablet  Commonly known as:  TOPROL-XL  Take 1 tablet (50 mg total) by mouth daily. Take with or immediately following a meal.     multivitamin per tablet  Take 1 tablet by mouth daily.     PROBIOTIC FORMULA PO  Take 2 capsules by mouth daily.     ranolazine 500 MG 12 hr tablet  Commonly known as:  RANEXA  Take 1 tablet (500 mg total) by mouth 2 (two) times daily.     sildenafil 100 MG tablet  Commonly known as:  VIAGRA  Take 1 tablet (100 mg total) by mouth as needed.     VITAMIN B 12 PO  Take 1 tablet by mouth daily.     warfarin 7.5 MG tablet  Commonly known as:  COUMADIN  Take 1 tablet (7.5 mg total) by mouth daily.       Allergies  Allergen Reactions  . Naproxen     REACTION: Hives       Follow-up Information   Follow up with Forbes Cellar, MD. Schedule an appointment as soon as possible for a visit in 2 months. (Stroke Clinic)    Specialties:  Neurology, Radiology   Contact information:   [place] [address] [phone]       Follow up with Scarlette Calico, MD In 2 weeks.   Specialty:  Internal Medicine   Contact information:   [address] [address] [phone]       Follow up with [facility] In 2 days.   Specialty:  Cardiology   Contact information:   [address] [place] [address] [phone]       The results of significant diagnostics from this hospitalization (including imaging, microbiology, ancillary and laboratory) are listed below for reference.    Significant Diagnostic Studies: Dg Chest 2 View  08/06/2013   CLINICAL DATA:  Stroke.  EXAM: CHEST  2 VIEW  COMPARISON:  07/27/2004  FINDINGS: Since prior study, cardiac surgery and mitral and aortic valve replacement has been performed heart is top-normal in size. No mediastinal or hilar masses or evidence of adenopathy.  Lungs are clear.  No pleural effusion.  No pneumothorax.  Bony thorax is intact.  IMPRESSION: No acute cardiopulmonary disease.   Electronically Signed   By: Lajean Manes M.D.   On: 08/06/2013 00:32   Ct Head Wo Contrast  08/05/2013   CLINICAL DATA:  Right-sided phase through pain. Difficulty getting words out. Some right arm numbness. Symptoms currently resolved.  EXAM: CT HEAD WITHOUT CONTRAST  TECHNIQUE: Contiguous axial images were obtained from the base of the skull through the vertex without intravenous contrast.  COMPARISON:  None.  FINDINGS: Ventricles are normal in configuration. There is ventricular and sulcal enlargement reflecting mild atrophy. No hydrocephalus.  There is a well-defined area of hypoattenuation in the left parietal lobe involving portions of the gray-white junction. This is not acute, but could reflect a subacute infarct. It may be chronic.  No other evidence of an infarct. No parenchymal masses or mass effect. Mild areas of white matter hypoattenuation are noted most consistent with  chronic microvascular ischemic change.  No extra-axial masses or abnormal fluid collections.  No intracranial hemorrhage.  Visualized  sinuses and mastoid air cells are clear. No skull lesion.  IMPRESSION: 1. 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 There is no evidence of an acute infarct. 2. Mild atrophy and chronic microvascular ischemic change. 3. No other abnormalities.   Electronically Signed   By: Lajean Manes M.D.   On: 08/05/2013 21:40   Mr Angiogram Neck W Wo Contrast  08/06/2013   CLINICAL DATA:  73 year old male with history of rheumatic heart disease and atrial fibrillation post mitral and aortic valve replacement. Sudden onset of difficulty speaking and right facial droop with right-sided numbness status completely resolved.  MRI HEAD WITHOUT AND WITH CONTRAST AND MRA HEAD WITH CONTRAST AND MRI NECK WITHOUT AND WITH CONTRAST  TECHNIQUE: Multiplanar, multiecho pulse sequences of the brain and surrounding structures were obtained without and with intravenous contrast. Angiographic images of the head were obtained using MRA technique without contrast. Multiplanar, multiecho pulse sequences of the neck and surrounding structures were obtained without and with intravenous contrast.  CONTRAST:  40mL MULTIHANCE GADOBENATE DIMEGLUMINE 529 MG/ML IV SOLN  COMPARISON:  08/05/2013 head CT.  No comparison MR.  FINDINGS: MRI HEAD FINDINGS  Scattered small acute nonhemorrhagic left frontal lobe infarct.  Subacute left parietal lobe infarct with laminar necrosis (less likely gyriform petechial hemorrhage).  Tiny punctate region of blood breakdown products posterior left temporal lobe may reflect result of hemorrhage ischemia without intracranial hemorrhage otherwise noted.  Mild to moderate small vessel disease type changes.  Mild gyriform enhancement of the subacute left parietal lobe infarct otherwise no evidence of intracranial enhancing lesion or mass identified.  No hydrocephalus.  Major  intracranial vascular structures are patent.  Mild mucosal thickening inferior aspect right maxillary sinus.  Transverse ligament hypertrophy with minimal anterior slippage of the C1 ring and mild posterior displacement of the dens contributing to moderate spinal stenosis and cord flattening greater on the left.  MRA HEAD FINDINGS  Small caliber A2 segment of the left anterior cerebral artery with majority of the A2 segment supplied from the right.  Anterior circulation without medium or large size vessel significant stenosis or occlusion.  Mild middle cerebral artery branch vessel irregularity.  Right posterior inferior cerebellar artery and anterior inferior cerebellar artery have a single origin from the proximal basilar artery. Similar appearance on the left slightly distal to this level.  No significant stenosis of the distal vertebral arteries or basilar artery.  Minimal irregularity distal posterior cerebral artery branch vessels.  No aneurysm noted.  MRI NECK FINDINGS  Ectatic 3 vessel aortic arch.  Ectasia great vessels.  Mild narrowing proximal right subclavian artery.  Minimal narrowing proximal right vertebral artery. Mild ectasia proximal left vertebral artery. No significant stenosis of the vertebral arteries.  No significant stenosis of either carotid artery. Prominent and ectasia of the vertical segment of the internal carotid arteries bilaterally.  IMPRESSION: MRI HEAD:  Scattered small acute nonhemorrhagic left frontal lobe infarct.  Subacute left parietal lobe infarct with laminar necrosis (less likely gyriform petechial hemorrhage).  Tiny punctate region of blood breakdown products posterior left temporal lobe may reflect result of hemorrhage ischemia without intracranial hemorrhage otherwise noted.  Mild to moderate small vessel disease type changes.  Mild gyriform enhancement of the subacute left parietal lobe infarct otherwise no evidence of intracranial enhancing lesion or mass identified.   Mild mucosal thickening inferior aspect right maxillary sinus.  Transverse ligament hypertrophy with minimal anterior slippage of the C1 ring and mild posterior displacement of the dens contributing to moderate spinal  stenosis and cord flattening greater on the left.  MRA HEAD:  Small caliber A2 segment of the left anterior cerebral artery with majority of the A2 segment supplied from the right.  Anterior circulation without medium or large size vessel significant stenosis or occlusion.  Mild middle cerebral artery branch vessel irregularity greater on the left.  Right posterior inferior cerebellar artery and anterior inferior cerebellar artery have a single origin from the proximal basilar artery. Similar appearance on the left slightly distal to this level.  No significant stenosis of the distal vertebral arteries or basilar artery.  Minimal irregularity distal posterior cerebral artery branch vessels.  MRI NECK :  Ectatic 3 vessel aortic arch.  Ectasia great vessels.  Mild narrowing proximal right subclavian artery.  Minimal narrowing proximal right vertebral artery. Mild ectasia proximal left vertebral artery. No significant stenosis of the vertebral arteries.  No significant stenosis of either carotid artery. Prominent and ectasia of the vertical segment of the internal carotid arteries bilaterally.   Electronically Signed   By: Chauncey Cruel M.D.   On: 08/06/2013 11:39   Mr Jeri Cos [id number] Contrast  08/06/2013   CLINICAL DATA:  73 year old male with history of rheumatic heart disease and atrial fibrillation post mitral and aortic valve replacement. Sudden onset of difficulty speaking and right facial droop with right-sided numbness status completely resolved.  MRI HEAD WITHOUT AND WITH CONTRAST AND MRA HEAD WITH CONTRAST AND MRI NECK WITHOUT AND WITH CONTRAST  TECHNIQUE: Multiplanar, multiecho pulse sequences of the brain and surrounding structures were obtained without and with intravenous contrast. Angiographic  images of the head were obtained using MRA technique without contrast. Multiplanar, multiecho pulse sequences of the neck and surrounding structures were obtained without and with intravenous contrast.  CONTRAST:  64mL MULTIHANCE GADOBENATE DIMEGLUMINE 529 MG/ML IV SOLN  COMPARISON:  08/05/2013 head CT.  No comparison MR.  FINDINGS: MRI HEAD FINDINGS  Scattered small acute nonhemorrhagic left frontal lobe infarct.  Subacute left parietal lobe infarct with laminar necrosis (less likely gyriform petechial hemorrhage).  Tiny punctate region of blood breakdown products posterior left temporal lobe may reflect result of hemorrhage ischemia without intracranial hemorrhage otherwise noted.  Mild to moderate small vessel disease type changes.  Mild gyriform enhancement of the subacute left parietal lobe infarct otherwise no evidence of intracranial enhancing lesion or mass identified.  No hydrocephalus.  Major intracranial vascular structures are patent.  Mild mucosal thickening inferior aspect right maxillary sinus.  Transverse ligament hypertrophy with minimal anterior slippage of the C1 ring and mild posterior displacement of the dens contributing to moderate spinal stenosis and cord flattening greater on the left.  MRA HEAD FINDINGS  Small caliber A2 segment of the left anterior cerebral artery with majority of the A2 segment supplied from the right.  Anterior circulation without medium or large size vessel significant stenosis or occlusion.  Mild middle cerebral artery branch vessel irregularity.  Right posterior inferior cerebellar artery and anterior inferior cerebellar artery have a single origin from the proximal basilar artery. Similar appearance on the left slightly distal to this level.  No significant stenosis of the distal vertebral arteries or basilar artery.  Minimal irregularity distal posterior cerebral artery branch vessels.  No aneurysm noted.  MRI NECK FINDINGS  Ectatic 3 vessel aortic arch.  Ectasia  great vessels.  Mild narrowing proximal right subclavian artery.  Minimal narrowing proximal right vertebral artery. Mild ectasia proximal left vertebral artery. No significant stenosis of the vertebral arteries.  No significant stenosis of  either carotid artery. Prominent and ectasia of the vertical segment of the internal carotid arteries bilaterally.  IMPRESSION: MRI HEAD:  Scattered small acute nonhemorrhagic left frontal lobe infarct.  Subacute left parietal lobe infarct with laminar necrosis (less likely gyriform petechial hemorrhage).  Tiny punctate region of blood breakdown products posterior left temporal lobe may reflect result of hemorrhage ischemia without intracranial hemorrhage otherwise noted.  Mild to moderate small vessel disease type changes.  Mild gyriform enhancement of the subacute left parietal lobe infarct otherwise no evidence of intracranial enhancing lesion or mass identified.  Mild mucosal thickening inferior aspect right maxillary sinus.  Transverse ligament hypertrophy with minimal anterior slippage of the C1 ring and mild posterior displacement of the dens contributing to moderate spinal stenosis and cord flattening greater on the left.  MRA HEAD:  Small caliber A2 segment of the left anterior cerebral artery with majority of the A2 segment supplied from the right.  Anterior circulation without medium or large size vessel significant stenosis or occlusion.  Mild middle cerebral artery branch vessel irregularity greater on the left.  Right posterior inferior cerebellar artery and anterior inferior cerebellar artery have a single origin from the proximal basilar artery. Similar appearance on the left slightly distal to this level.  No significant stenosis of the distal vertebral arteries or basilar artery.  Minimal irregularity distal posterior cerebral artery branch vessels.  MRI NECK :  Ectatic 3 vessel aortic arch.  Ectasia great vessels.  Mild narrowing proximal right subclavian  artery.  Minimal narrowing proximal right vertebral artery. Mild ectasia proximal left vertebral artery. No significant stenosis of the vertebral arteries.  No significant stenosis of either carotid artery. Prominent and ectasia of the vertical segment of the internal carotid arteries bilaterally.   Electronically Signed   By: Chauncey Cruel M.D.   On: 08/06/2013 11:39   Mr Mra Head/brain Wo Cm  08/06/2013   CLINICAL DATA:  73 year old male with history of rheumatic heart disease and atrial fibrillation post mitral and aortic valve replacement. Sudden onset of difficulty speaking and right facial droop with right-sided numbness status completely resolved.  MRI HEAD WITHOUT AND WITH CONTRAST AND MRA HEAD WITH CONTRAST AND MRI NECK WITHOUT AND WITH CONTRAST  TECHNIQUE: Multiplanar, multiecho pulse sequences of the brain and surrounding structures were obtained without and with intravenous contrast. Angiographic images of the head were obtained using MRA technique without contrast. Multiplanar, multiecho pulse sequences of the neck and surrounding structures were obtained without and with intravenous contrast.  CONTRAST:  93mL MULTIHANCE GADOBENATE DIMEGLUMINE 529 MG/ML IV SOLN  COMPARISON:  08/05/2013 head CT.  No comparison MR.  FINDINGS: MRI HEAD FINDINGS  Scattered small acute nonhemorrhagic left frontal lobe infarct.  Subacute left parietal lobe infarct with laminar necrosis (less likely gyriform petechial hemorrhage).  Tiny punctate region of blood breakdown products posterior left temporal lobe may reflect result of hemorrhage ischemia without intracranial hemorrhage otherwise noted.  Mild to moderate small vessel disease type changes.  Mild gyriform enhancement of the subacute left parietal lobe infarct otherwise no evidence of intracranial enhancing lesion or mass identified.  No hydrocephalus.  Major intracranial vascular structures are patent.  Mild mucosal thickening inferior aspect right maxillary sinus.   Transverse ligament hypertrophy with minimal anterior slippage of the C1 ring and mild posterior displacement of the dens contributing to moderate spinal stenosis and cord flattening greater on the left.  MRA HEAD FINDINGS  Small caliber A2 segment of the left anterior cerebral artery with majority of the  A2 segment supplied from the right.  Anterior circulation without medium or large size vessel significant stenosis or occlusion.  Mild middle cerebral artery branch vessel irregularity.  Right posterior inferior cerebellar artery and anterior inferior cerebellar artery have a single origin from the proximal basilar artery. Similar appearance on the left slightly distal to this level.  No significant stenosis of the distal vertebral arteries or basilar artery.  Minimal irregularity distal posterior cerebral artery branch vessels.  No aneurysm noted.  MRI NECK FINDINGS  Ectatic 3 vessel aortic arch.  Ectasia great vessels.  Mild narrowing proximal right subclavian artery.  Minimal narrowing proximal right vertebral artery. Mild ectasia proximal left vertebral artery. No significant stenosis of the vertebral arteries.  No significant stenosis of either carotid artery. Prominent and ectasia of the vertical segment of the internal carotid arteries bilaterally.  IMPRESSION: MRI HEAD:  Scattered small acute nonhemorrhagic left frontal lobe infarct.  Subacute left parietal lobe infarct with laminar necrosis (less likely gyriform petechial hemorrhage).  Tiny punctate region of blood breakdown products posterior left temporal lobe may reflect result of hemorrhage ischemia without intracranial hemorrhage otherwise noted.  Mild to moderate small vessel disease type changes.  Mild gyriform enhancement of the subacute left parietal lobe infarct otherwise no evidence of intracranial enhancing lesion or mass identified.  Mild mucosal thickening inferior aspect right maxillary sinus.  Transverse ligament hypertrophy with minimal  anterior slippage of the C1 ring and mild posterior displacement of the dens contributing to moderate spinal stenosis and cord flattening greater on the left.  MRA HEAD:  Small caliber A2 segment of the left anterior cerebral artery with majority of the A2 segment supplied from the right.  Anterior circulation without medium or large size vessel significant stenosis or occlusion.  Mild middle cerebral artery branch vessel irregularity greater on the left.  Right posterior inferior cerebellar artery and anterior inferior cerebellar artery have a single origin from the proximal basilar artery. Similar appearance on the left slightly distal to this level.  No significant stenosis of the distal vertebral arteries or basilar artery.  Minimal irregularity distal posterior cerebral artery branch vessels.  MRI NECK :  Ectatic 3 vessel aortic arch.  Ectasia great vessels.  Mild narrowing proximal right subclavian artery.  Minimal narrowing proximal right vertebral artery. Mild ectasia proximal left vertebral artery. No significant stenosis of the vertebral arteries.  No significant stenosis of either carotid artery. Prominent and ectasia of the vertical segment of the internal carotid arteries bilaterally.   Electronically Signed   By: Chauncey Cruel M.D.   On: 08/06/2013 11:39    Microbiology: No results found for this or any previous visit (from the past 240 hour(s)).   Labs: Basic Metabolic Panel:  Recent Labs Lab 08/05/13 2034 08/05/13 2041 08/05/13 2331  NA 143 146  --   K 4.3 4.1  --   CL 106 105  --   CO2 22  --   --   GLUCOSE 111* 104*  --   BUN 30* 29*  --   CREATININE 0.93 1.10 0.95  CALCIUM 9.1  --   --    Liver Function Tests:  Recent Labs Lab 08/05/13 0957 08/05/13 2034  AST 28 35  ALT 46 50  ALKPHOS 64 78  BILITOT 1.3* 0.5  PROT 6.6 7.1  ALBUMIN 3.8 3.9   No results found for this basename: LIPASE, AMYLASE,  in the last 168 hours No results found for this basename: AMMONIA,   in the last  168 hours CBC:  Recent Labs Lab 08/05/13 2034 08/05/13 2041 08/05/13 2331  WBC 9.5  --  9.3  NEUTROABS 5.7  --   --   HGB 13.7 15.0 13.2  HCT 40.3 44.0 38.3*  MCV 94.8  --  94.3  PLT 268  --  261   Cardiac Enzymes: No results found for this basename: CKTOTAL, CKMB, CKMBINDEX, TROPONINI,  in the last 168 hours BNP: BNP (last 3 results) No results found for this basename: PROBNP,  in the last 8760 hours CBG: No results found for this basename: GLUCAP,  in the last 168 hours     Signed:  Kelvin Cellar  [facility] 08/07/2013, 11:28 AM

## 2013-08-07 NOTE — Progress Notes (Signed)
Stroke Team Progress Note  HISTORY [name] [name] is a 73 y.o. 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 He has had the replacements, and has had intermittent atrial fibrillation most recently in March was planning on having DC cardioversion spontaneously reverted. His cardiologist noted that he had had a left atrial appendage ablation and therefore discontinued his Coumadin. He continues to be in and out of atrial fibrillation.  Tonight 08/05/2013 at 630p, he was talking with his brother when he had sudden onset of difficulty speaking with preserved understanding. He also had right facial droop and right-sided numbness. This completely resolved. Patient was not administered TPA secondary to symptoms resolved. He was admitted for further evaluation and treatment.  SUBJECTIVE His wife is at the bedside.  OBJECTIVE Most recent Vital Signs: Filed Vitals:   08/06/13 1405 08/06/13 1745 08/06/13 2100 08/07/13 0525  BP: 125/82 118/74 121/73 126/70  Pulse: 71 71 88 82  Temp: 97.1 F (36.2 C) 97.8 F (36.6 C) 98 F (36.7 C) 97.7 F (36.5 C)  TempSrc: Oral Oral Oral Oral  Resp: 18 18 20 20   Height:      Weight:      SpO2: 96% 96% 95% 98%   CBG (last 3)  No results found for this basename: GLUCAP,  in the last 72 hours  IV Fluid Intake:     MEDICATIONS  . aspirin  325 mg Oral Daily  . atorvastatin  80 mg Oral Daily  . dronedarone  400 mg Oral BID WC  . enoxaparin (LOVENOX) injection  40 mg Subcutaneous Daily  . metoprolol succinate  50 mg Oral Daily  . omega-3 acid ethyl esters  1,000 mg Oral BID  . pyridOXINE  100 mg Oral Daily  . ranolazine  500 mg Oral BID   PRN:  senna-docusate  Diet:  Cardiac thin liquids Activity:   DVT Prophylaxis:  Lovenox 40 mg sq daily   CLINICALLY SIGNIFICANT STUDIES Basic Metabolic Panel:   Recent Labs Lab 08/05/13 2034 08/05/13 2041 08/05/13 2331  NA 143 146  --   K 4.3  4.1  --   CL 106 105  --   CO2 22  --   --   GLUCOSE 111* 104*  --   BUN 30* 29*  --   CREATININE 0.93 1.10 0.95  CALCIUM 9.1  --   --    Liver Function Tests:   Recent Labs Lab 08/05/13 0957 08/05/13 2034  AST 28 35  ALT 46 50  ALKPHOS 64 78  BILITOT 1.3* 0.5  PROT 6.6 7.1  ALBUMIN 3.8 3.9   CBC:   Recent Labs Lab 08/05/13 2034 08/05/13 2041 08/05/13 2331  WBC 9.5  --  9.3  NEUTROABS 5.7  --   --   HGB 13.7 15.0 13.2  HCT 40.3 44.0 38.3*  MCV 94.8  --  94.3  PLT 268  --  261   Coagulation:   Recent Labs Lab 08/05/13 2034  LABPROT 12.9  INR 0.99   Cardiac Enzymes: No results found for this basename: CKTOTAL, CKMB, CKMBINDEX, TROPONINI,  in the last 168 hours Urinalysis:   Recent Labs Lab 08/05/13 2207  COLORURINE YELLOW  LABSPEC 1.027  PHURINE 6.0  GLUCOSEU NEGATIVE  HGBUR NEGATIVE  BILIRUBINUR NEGATIVE  KETONESUR NEGATIVE  PROTEINUR NEGATIVE  UROBILINOGEN 1.0  NITRITE NEGATIVE  LEUKOCYTESUR NEGATIVE   Lipid Panel    Component Value Date/Time   CHOL  114 08/06/2013 0647   TRIG 62 08/06/2013 0647   HDL 48 08/06/2013 0647   CHOLHDL 2.4 08/06/2013 0647   VLDL 12 08/06/2013 0647   LDLCALC 54 08/06/2013 0647   HgbA1C  Lab Results  Component Value Date   HGBA1C 5.9* 08/06/2013    Urine Drug Screen:     Component Value Date/Time   LABOPIA NONE DETECTED 08/05/2013 2207   COCAINSCRNUR NONE DETECTED 08/05/2013 2207   LABBENZ NONE DETECTED 08/05/2013 2207   AMPHETMU NONE DETECTED 08/05/2013 2207   THCU NONE DETECTED 08/05/2013 2207   LABBARB NONE DETECTED 08/05/2013 2207    Alcohol Level:   Recent Labs Lab 08/05/13 2034  ETH <11    CT of the brain  08/05/2013   1. 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 There is no evidence of an acute infarct. 2. Mild atrophy and chronic microvascular ischemic change. 3. No other abnormalities.   MRI of the brain  08/06/2013    Scattered small acute nonhemorrhagic left frontal lobe infarct.  Subacute left  parietal lobe infarct with laminar necrosis (less likely gyriform petechial hemorrhage).  Tiny punctate region of blood breakdown products posterior left temporal lobe may reflect result of hemorrhage ischemia without intracranial hemorrhage otherwise noted.  Mild to moderate small vessel disease type changes.  Mild gyriform enhancement of the subacute left parietal lobe infarct otherwise no evidence of intracranial enhancing lesion or mass identified.  Mild mucosal thickening inferior aspect right maxillary sinus.  Transverse ligament hypertrophy with minimal anterior slippage of the C1 ring and mild posterior displacement of the dens contributing to moderate spinal stenosis and cord flattening greater on the left.    MRA of the brain  08/06/2013     Small caliber A2 segment of the left anterior cerebral artery with majority of the A2 segment supplied from the right.  Anterior circulation without medium or large size vessel significant stenosis or occlusion.  Mild middle cerebral artery branch vessel irregularity greater on the left.  Right posterior inferior cerebellar artery and anterior inferior cerebellar artery have a single origin from the proximal basilar artery. Similar appearance on the left slightly distal to this level.  No significant stenosis of the distal vertebral arteries or basilar artery.  Minimal irregularity distal posterior cerebral artery branch vessels.    MRA of the neck 08/06/2013    Ectatic 3 vessel aortic arch.  Ectasia great vessels.  Mild narrowing proximal right subclavian artery.  Minimal narrowing proximal right vertebral artery. Mild ectasia proximal left vertebral artery. No significant stenosis of the vertebral arteries.  No significant stenosis of either carotid artery. Prominent and ectasia of the vertical segment of the internal carotid arteries bilaterally.   2D Echocardiogram  EF 55-60% with no source of embolus.   Carotid Doppler  No evidence of hemodynamically  significant internal carotid artery stenosis. Vertebral artery flow is antegrade.   CXR  08/06/2013   No acute cardiopulmonary disease.   EKG  Atrial flutter, rate 72For complete results please see formal report.   Therapy Recommendations no PT, no OT   Physical Exam   Awake alert. Afebrile. Head is nontraumatic. Neck is supple without bruit. Hearing is normal. Cardiac exam no murmur or gallop. Lungs are clear to auscultation. Distal pulses are well felt. Neurological Exam ;  Awake  Alert oriented x 3. Normal speech and language.eye movements full without nystagmus.fundi were not visualized. Vision acuity and fields appear normal. Hearing is normal. Palatal movements are normal. Face symmetric. Tongue  midline. Normal strength, tone, reflexes and coordination. Normal sensation. Gait deferred. ASSESSMENT [name] [name] is a 73 y.o. male presenting with right hemiparesis and difficulty speaking. 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 Infarcts felt to be embolic secondary to known atrial fibrillation.  On aspirin 325 mg orally every day prior to admission. Now on aspirin 325 mg orally every day for secondary stroke prevention. Stroke work up underway.  atrial fibrillation, spontaneously revered to normal sinus rhythm. S/p MAZE and left atrial appendage ablation, therefore, cardiology discontinued his coumadin. 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 Given valvular nature of atrial fibrillation, cardiology recommends warfarin over NOAC. Acute rheumatic heart disease, unspecified, childhood age 17&8 s/p bioprosthetic valve replacements Hyperlipidemia, LDL 54, on omega 3s PTA, now on no statin, at goal LDL < 100. Statin not indicated given normal LDL.  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   Family hx stroke (mother)  Hospital day # 2  TREATMENT/PLAN  Change to warfarin for secondary stroke prevention. If difficult to control, recommend NOAC  No therapy  needs  Ok for discharge from stroke standpoint  No further stroke workup indicated.  Patient has a 10-15% risk of having another stroke over the next year, the highest risk is within 2 weeks of the most recent stroke/TIA (risk of having a stroke following a stroke or TIA is the same).  Ongoing risk factor control by Primary Care Physician  Stroke Service will sign off. Please call should any needs arise.  Follow up with Dr. [name], [facility], in 2 months.   SIGNED Burnetta Sabin, MSN, RN, ANVP-BC, ANP-BC, GNP-BC [facility] Pager: [phone] 08/07/2013 9:27 AM   I have personally obtained a history, examined the patient, evaluated imaging results, and formulated the assessment and plan of care. I agree with the above. Antony Contras, MD   To contact Stroke Continuity provider, please refer to [email or web address] After hours, contact General Neurology

## 2013-08-08 ENCOUNTER — Ambulatory Visit (INDEPENDENT_AMBULATORY_CARE_PROVIDER_SITE_OTHER): Payer: Medicare Other | Admitting: *Deleted

## 2013-08-08 DIAGNOSIS — I6340 Cerebral infarction due to embolism of unspecified cerebral artery: Secondary | ICD-10-CM

## 2013-08-08 DIAGNOSIS — I4891 Unspecified atrial fibrillation: Secondary | ICD-10-CM

## 2013-08-08 DIAGNOSIS — I4892 Unspecified atrial flutter: Secondary | ICD-10-CM | POA: Diagnosis not present

## 2013-08-08 DIAGNOSIS — Z5181 Encounter for therapeutic drug level monitoring: Secondary | ICD-10-CM | POA: Diagnosis not present

## 2013-08-08 LAB — POCT INR: INR: 1

## 2013-08-08 MED ORDER — WARFARIN SODIUM 5 MG PO TABS: ORAL_TABLET | ORAL | Status: DC

## 2013-08-08 NOTE — Patient Instructions (Signed)

## 2013-08-09 ENCOUNTER — Ambulatory Visit: Payer: Medicare Other | Admitting: Cardiology

## 2013-08-13 ENCOUNTER — Ambulatory Visit (INDEPENDENT_AMBULATORY_CARE_PROVIDER_SITE_OTHER): Payer: Medicare Other | Admitting: *Deleted

## 2013-08-13 DIAGNOSIS — I4891 Unspecified atrial fibrillation: Secondary | ICD-10-CM

## 2013-08-13 DIAGNOSIS — I6340 Cerebral infarction due to embolism of unspecified cerebral artery: Secondary | ICD-10-CM

## 2013-08-13 DIAGNOSIS — I4892 Unspecified atrial flutter: Secondary | ICD-10-CM | POA: Diagnosis not present

## 2013-08-13 DIAGNOSIS — Z5181 Encounter for therapeutic drug level monitoring: Secondary | ICD-10-CM

## 2013-08-13 LAB — POCT INR: INR: 4.5

## 2013-08-16 ENCOUNTER — Ambulatory Visit (INDEPENDENT_AMBULATORY_CARE_PROVIDER_SITE_OTHER): Payer: Medicare Other

## 2013-08-16 DIAGNOSIS — Z5181 Encounter for therapeutic drug level monitoring: Secondary | ICD-10-CM | POA: Diagnosis not present

## 2013-08-16 DIAGNOSIS — I4892 Unspecified atrial flutter: Secondary | ICD-10-CM

## 2013-08-16 DIAGNOSIS — I4891 Unspecified atrial fibrillation: Secondary | ICD-10-CM | POA: Diagnosis not present

## 2013-08-16 DIAGNOSIS — I6340 Cerebral infarction due to embolism of unspecified cerebral artery: Secondary | ICD-10-CM | POA: Diagnosis not present

## 2013-08-16 LAB — POCT INR: INR: 1.7

## 2013-08-19 ENCOUNTER — Encounter: Payer: Self-pay | Admitting: Internal Medicine

## 2013-08-23 ENCOUNTER — Ambulatory Visit (INDEPENDENT_AMBULATORY_CARE_PROVIDER_SITE_OTHER): Payer: Medicare Other | Admitting: *Deleted

## 2013-08-23 DIAGNOSIS — I6340 Cerebral infarction due to embolism of unspecified cerebral artery: Secondary | ICD-10-CM | POA: Diagnosis not present

## 2013-08-23 DIAGNOSIS — Z5181 Encounter for therapeutic drug level monitoring: Secondary | ICD-10-CM | POA: Diagnosis not present

## 2013-08-23 DIAGNOSIS — I4891 Unspecified atrial fibrillation: Secondary | ICD-10-CM

## 2013-08-23 LAB — POCT INR: INR: 2.3

## 2013-08-26 ENCOUNTER — Ambulatory Visit (INDEPENDENT_AMBULATORY_CARE_PROVIDER_SITE_OTHER): Payer: Medicare Other | Admitting: Cardiology

## 2013-08-26 ENCOUNTER — Encounter: Payer: Self-pay | Admitting: Cardiology

## 2013-08-26 VITALS — BP 126/82 | HR 77 | Ht 68.0 in | Wt 193.0 lb

## 2013-08-26 DIAGNOSIS — I10 Essential (primary) hypertension: Secondary | ICD-10-CM

## 2013-08-26 DIAGNOSIS — I6340 Cerebral infarction due to embolism of unspecified cerebral artery: Secondary | ICD-10-CM | POA: Diagnosis not present

## 2013-08-26 DIAGNOSIS — I359 Nonrheumatic aortic valve disorder, unspecified: Secondary | ICD-10-CM | POA: Diagnosis not present

## 2013-08-26 DIAGNOSIS — Z9889 Other specified postprocedural states: Secondary | ICD-10-CM | POA: Diagnosis not present

## 2013-08-26 DIAGNOSIS — I4892 Unspecified atrial flutter: Secondary | ICD-10-CM | POA: Diagnosis not present

## 2013-08-26 MED ORDER — DRONEDARONE HCL 400 MG PO TABS: 400.0000 mg | ORAL_TABLET | Freq: Two times a day (BID) | ORAL | Status: DC

## 2013-08-26 MED ORDER — RANOLAZINE ER 500 MG PO TB12: 500.0000 mg | ORAL_TABLET | Freq: Two times a day (BID) | ORAL | Status: DC

## 2013-08-26 MED ORDER — METOPROLOL SUCCINATE ER 50 MG PO TB24: 50.0000 mg | ORAL_TABLET | Freq: Every day | ORAL | Status: DC

## 2013-08-26 NOTE — Patient Instructions (Signed)
You should have weekly pro-times. Once your pro times have been OK for 4 weeks, Dr [name] will schedule a cardioversion.   Your physician recommends that you schedule a follow-up appointment in: 2 months with Dr [name].

## 2013-08-27 NOTE — Progress Notes (Signed)
Patient ID: [name] [name], male   DOB: 10/02/1940, 73 y.o.   MRN: [id number] PCP: Dr. Linda Hedges  73 yo with history of rheumatic fever and AI/MR s/p bioprosthetic AVR and MVR at the [facility] as well as MAZE procedure all in 2006 presents for cardiology evaluation.  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  I started him on dronedarone and coumadin with plan for cardioversion after 4 wks of therapeutic anticoagulation if he did not convert back to NSR on his own.  He went back into NSR spontaneously and later stopped coumadin. Atrial flutter recurred in 3/15 and again resolved spontaneously.  In 6/15, patient was admitted with transient dysarthria (resolved in about 10 minutes).  However, he was found to have evidence for CVA by MRI.  Therefore, he was started on coumadin. He was noted to be in atypical atrial flutter again.  Ranolazine was added to dronedarone.    Today, patient remains in atrial flutter, rate-controlled.  He has had no further neurological symptoms.  He feels ok overall with no exertional dyspnea though he has not been very active.  No chest pain. No lightheadedness. INR was therapeutic on 6/19.    ECG (12/12/11): Atypical atrial flutter versus atrial tachycardia with rate around 100 bpm, QTc normal.  ECG (12/29/11): NSR, 1st degree AV block PR 242 msec.    ECG (12/13): NSR, 1st degree AV block PR 244 msec    ECG (10/14): NSR, 1st degree AV block ECG (3/15): Atypical atrial flutter with rate 87, inferior T wave inversions ECG (6/15): Atypical atrial flutter with rate 77  Labs (3/14): LDL 64, TSH normal, K 3.9, creatinine 1.0     Labs (6/15): LDL 54, HDL 48, creatinine 0.95                                                                                                                                   PMH: 1. Atrial fibrillation: s/p MAZE and LAA ligation in 2006.  No documented atrial fibrillation since operation.  2. Atypical  atrial flutter: 10/13, 3/15, and again in 6/15.  3. 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  Last echo here in 5/11 with EF 60-65%, normal bioprosthetic aortic valve, normal bioprosthetic mitral valve, moderate LAE.  Echo 11/12 at [facility] looked ok per report. Echo (10/13) with EF 55-60%, mild LVH, normal bioprosthetic aortic and mitral valves, moderate LAE, PA systolic pressure 35 mmHg.  Echo (6/15) with EF 60%, moderate LVH, normal-appearing bioprosthetic MV and AoV, PA systolic pressure 32 mmHg.  4. Hyperlipidemia 5. L-spine surgery 6. LHC with no significant coronary disease in 2006 pre-op.  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  SH: Married, retired Forensic psychologist [name], [place], [place]).  2 sons.  Never smoked.   FH: No premature CAD.   ROS: All systems reviewed and  negative except as per HPI.  Current Outpatient Prescriptions  Medication Sig Dispense Refill  . atorvastatin (LIPITOR) 80 MG tablet Take 1 tablet (80 mg total) by mouth daily.  90 tablet  3  . Bilberry 60 MG CAPS Take by mouth daily.      . Coenzyme Q10-Vitamin E 100-300 MG-UNIT WAFR Take by mouth daily.      . Cyanocobalamin (VITAMIN B 12 PO) Take 1 tablet by mouth daily.      [name] dronedarone (MULTAQ) 400 MG tablet Take 1 tablet (400 mg total) by mouth 2 (two) times daily with a meal.  180 tablet  3  . fish oil-omega-3 fatty acids 1000 MG capsule Take 3 g by mouth daily.      [name] loratadine (CLARITIN) 10 MG tablet Take 10 mg by mouth as needed for allergies.      . metoprolol succinate (TOPROL-XL) 50 MG 24 hr tablet Take 1 tablet (50 mg total) by mouth daily. Take with or immediately following a meal.  90 tablet  3  . multivitamin (THERAGRAN) per tablet Take 1 tablet by mouth daily.      . Omega-3 Fatty Acids (FISH OIL) 1000 MG CAPS Take 1,000 mg by mouth 2 (two) times daily.      . Probiotic Product (PROBIOTIC FORMULA PO) Take 2  capsules by mouth daily.       . ranolazine (RANEXA) 500 MG 12 hr tablet Take 1 tablet (500 mg total) by mouth 2 (two) times daily.  180 tablet  3  . sildenafil (VIAGRA) 100 MG tablet Take 1 tablet (100 mg total) by mouth as needed.  10 tablet  6  . warfarin (COUMADIN) 5 MG tablet Take as directed by coumadin clinic  50 tablet  3   No current facility-administered medications for this visit.    BP 126/82  Pulse 77  Ht 5\' 8"  (1.727 m)  Wt 87.544 kg (193 lb)  BMI 29.35 kg/m2  SpO2 96% General: NAD Neck: No JVD, no thyromegaly or thyroid nodule.  Lungs: Clear to auscultation bilaterally with normal respiratory effort. CV: Nondisplaced PMI.  Heart irregular S1/S2, no S3/S4, 1/6 early SEM RUSB.  No peripheral edema.  No carotid bruit.  Normal pedal pulses.  Abdomen: Soft, nontender, no hepatosplenomegaly, no distention.  Neurologic: Alert and oriented x 3.  Psych: Normal affect. Extremities: No clubbing or cyanosis.   Assessment/Plan: 1. Atypical atrial flutter: Patient had a MAZE procedure with LAA ligation at the time of his valve replacement.  Since then, he has had occasional episodes of atypical atrial flutter. He had episodes in 10/13 and 3/15 that converted back to NSR on dronedarone. He was noted to be back in atrial flutter in 6/15 in the setting of a CVA.  - Continue coumadin (not using NOAC due to valvular atrial fibrillation, do not have good data).  - Continue current Toprol XL. - He will continue dronedarone and ranolazine (ranolazine used for antiarrhythmic properties).  - Check INR weekly, DCCV after 3 more therapeutic INR readings.  - He would like to go to [facility] for atypical atrial flutter ablation (had valve replacements there). Dr. Caryl Comes is arranging.  2. 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  Valves looked normal on 6/15 echo.   3. HTN: BP is reasonably controlled.  4. Hyperlipidemia: Good lipids in 6/15 on atorvastatin.    5. CVA: Suspect this was related to atrial flutter (despite LA appendage ligation).  He is now on  coumadin. No residual neurologic deficits.   Loralie Champagne 08/27/2013

## 2013-08-29 ENCOUNTER — Ambulatory Visit (INDEPENDENT_AMBULATORY_CARE_PROVIDER_SITE_OTHER): Payer: Medicare Other | Admitting: *Deleted

## 2013-08-29 DIAGNOSIS — I6340 Cerebral infarction due to embolism of unspecified cerebral artery: Secondary | ICD-10-CM

## 2013-08-29 DIAGNOSIS — Z5181 Encounter for therapeutic drug level monitoring: Secondary | ICD-10-CM

## 2013-08-29 DIAGNOSIS — I4891 Unspecified atrial fibrillation: Secondary | ICD-10-CM

## 2013-08-29 LAB — POCT INR: INR: 3

## 2013-09-04 ENCOUNTER — Ambulatory Visit (INDEPENDENT_AMBULATORY_CARE_PROVIDER_SITE_OTHER): Payer: Medicare Other | Admitting: Pharmacist

## 2013-09-04 DIAGNOSIS — I4892 Unspecified atrial flutter: Secondary | ICD-10-CM

## 2013-09-04 DIAGNOSIS — I4891 Unspecified atrial fibrillation: Secondary | ICD-10-CM

## 2013-09-04 DIAGNOSIS — Z5181 Encounter for therapeutic drug level monitoring: Secondary | ICD-10-CM | POA: Diagnosis not present

## 2013-09-04 DIAGNOSIS — I6340 Cerebral infarction due to embolism of unspecified cerebral artery: Secondary | ICD-10-CM

## 2013-09-04 LAB — POCT INR: INR: 3.4

## 2013-09-05 LAB — PROTIME-INR
INR: 2.27 — ABNORMAL HIGH (ref 0.08–1.20)
PROTHROMBIN TIME: 24.6 s — AB (ref 11.5–14.9)

## 2013-09-05 NOTE — Telephone Encounter (Signed)
Dr. Caryl Comes spoke with Dr. Cletis Media office and patient this morning.  Faxing 6/23 12 lead EKG, imaging studies from hospital, and Dr. Elby Showers notes. Faxing to [phone]

## 2013-09-12 ENCOUNTER — Ambulatory Visit (INDEPENDENT_AMBULATORY_CARE_PROVIDER_SITE_OTHER): Payer: Medicare Other

## 2013-09-12 DIAGNOSIS — I4892 Unspecified atrial flutter: Secondary | ICD-10-CM

## 2013-09-12 DIAGNOSIS — Z5181 Encounter for therapeutic drug level monitoring: Secondary | ICD-10-CM

## 2013-09-12 DIAGNOSIS — I6340 Cerebral infarction due to embolism of unspecified cerebral artery: Secondary | ICD-10-CM

## 2013-09-12 DIAGNOSIS — I4891 Unspecified atrial fibrillation: Secondary | ICD-10-CM

## 2013-09-12 LAB — POCT INR: INR: 2.1

## 2013-09-17 ENCOUNTER — Telehealth: Payer: Self-pay | Admitting: *Deleted

## 2013-09-17 ENCOUNTER — Encounter (HOSPITAL_COMMUNITY): Payer: Self-pay | Admitting: Pharmacy Technician

## 2013-09-17 ENCOUNTER — Encounter: Payer: Self-pay | Admitting: *Deleted

## 2013-09-17 NOTE — Telephone Encounter (Signed)
DCCV scheduled for 09/26/13 10 AM, PT/INR 09/26/13 7:45AM. Pt advised.

## 2013-09-17 NOTE — Telephone Encounter (Signed)
Message copied by Katrine Coho on Tue Sep 17, 2013 12:13 PM ------      Message from: Larey Dresser      Created: Mon Sep 16, 2013  9:31 AM       That would be fine thanks.       ----- Message -----         From: Katrine Coho, RN         Sent: 09/16/2013   8:14 AM           To: Larey Dresser, MD            First INR > 2 was 08/23/13. He is scheduled for INR 09/19/13. Ysidro Evert said if that was >2 that would be #4. Do you want me to go ahead and schedule DCCV  for next week?       ----- Message -----         From: Larey Dresser, MD         Sent: 09/15/2013   2:45 PM           To: Katrine Coho, RN            Webb Silversmith,      Could you please set him up for DCCV this week.  Will need point of care INR in office before DCCV.  May need to check with Kevan Rosebush for scheduling this week.       ----- Message -----         From: Flossie Dibble, RN         Sent: 09/12/2013   9:25 AM           To: Larey Dresser, MD, Katrine Coho, RN            Pt has had 4 consecutive weekly INR's greater than 2.0, pending DCCV.                   ------

## 2013-09-19 ENCOUNTER — Ambulatory Visit (INDEPENDENT_AMBULATORY_CARE_PROVIDER_SITE_OTHER): Payer: Medicare Other

## 2013-09-19 DIAGNOSIS — Z5181 Encounter for therapeutic drug level monitoring: Secondary | ICD-10-CM | POA: Diagnosis not present

## 2013-09-19 DIAGNOSIS — I4892 Unspecified atrial flutter: Secondary | ICD-10-CM

## 2013-09-19 DIAGNOSIS — I4891 Unspecified atrial fibrillation: Secondary | ICD-10-CM | POA: Diagnosis not present

## 2013-09-19 DIAGNOSIS — I484 Atypical atrial flutter: Secondary | ICD-10-CM

## 2013-09-19 DIAGNOSIS — I6340 Cerebral infarction due to embolism of unspecified cerebral artery: Secondary | ICD-10-CM

## 2013-09-19 LAB — POCT INR: INR: 2.6

## 2013-09-26 ENCOUNTER — Encounter (HOSPITAL_COMMUNITY): Payer: Medicare Other | Admitting: Certified Registered Nurse Anesthetist

## 2013-09-26 ENCOUNTER — Ambulatory Visit (HOSPITAL_COMMUNITY): Payer: Medicare Other | Admitting: Certified Registered Nurse Anesthetist

## 2013-09-26 ENCOUNTER — Encounter (HOSPITAL_COMMUNITY): Payer: Self-pay | Admitting: *Deleted

## 2013-09-26 ENCOUNTER — Other Ambulatory Visit (HOSPITAL_COMMUNITY): Payer: Self-pay | Admitting: Cardiology

## 2013-09-26 ENCOUNTER — Ambulatory Visit (HOSPITAL_COMMUNITY)
Admission: RE | Admit: 2013-09-26 | Discharge: 2013-09-26 | Disposition: A | Discharge status: Home / Self Care | Payer: Medicare Other | Source: Ambulatory Visit | Attending: Cardiology | Admitting: Cardiology

## 2013-09-26 ENCOUNTER — Ambulatory Visit (INDEPENDENT_AMBULATORY_CARE_PROVIDER_SITE_OTHER): Payer: Medicare Other | Admitting: *Deleted

## 2013-09-26 ENCOUNTER — Encounter (HOSPITAL_COMMUNITY)
Admission: RE | Disposition: A | Discharge status: Home / Self Care | Payer: Self-pay | Source: Ambulatory Visit | Attending: Cardiology

## 2013-09-26 DIAGNOSIS — E785 Hyperlipidemia, unspecified: Secondary | ICD-10-CM | POA: Insufficient documentation

## 2013-09-26 DIAGNOSIS — I099 Rheumatic heart disease, unspecified: Secondary | ICD-10-CM | POA: Insufficient documentation

## 2013-09-26 DIAGNOSIS — I4892 Unspecified atrial flutter: Secondary | ICD-10-CM | POA: Diagnosis not present

## 2013-09-26 DIAGNOSIS — I6340 Cerebral infarction due to embolism of unspecified cerebral artery: Secondary | ICD-10-CM

## 2013-09-26 DIAGNOSIS — Z952 Presence of prosthetic heart valve: Secondary | ICD-10-CM | POA: Diagnosis not present

## 2013-09-26 DIAGNOSIS — Z5181 Encounter for therapeutic drug level monitoring: Secondary | ICD-10-CM

## 2013-09-26 DIAGNOSIS — Z79899 Other long term (current) drug therapy: Secondary | ICD-10-CM | POA: Insufficient documentation

## 2013-09-26 DIAGNOSIS — Z8673 Personal history of transient ischemic attack (TIA), and cerebral infarction without residual deficits: Secondary | ICD-10-CM | POA: Insufficient documentation

## 2013-09-26 DIAGNOSIS — I4891 Unspecified atrial fibrillation: Secondary | ICD-10-CM | POA: Diagnosis not present

## 2013-09-26 DIAGNOSIS — I10 Essential (primary) hypertension: Secondary | ICD-10-CM | POA: Insufficient documentation

## 2013-09-26 DIAGNOSIS — I739 Peripheral vascular disease, unspecified: Secondary | ICD-10-CM | POA: Diagnosis not present

## 2013-09-26 HISTORY — PX: CARDIOVERSION: SHX1299

## 2013-09-26 LAB — POCT INR: INR: 2.7

## 2013-09-26 SURGERY — CARDIOVERSION
Anesthesia: Monitor Anesthesia Care

## 2013-09-26 MED ORDER — PROPOFOL 10 MG/ML IV BOLUS
INTRAVENOUS | Status: DC | PRN
  Administered 2013-09-26: 70 mg via INTRAVENOUS

## 2013-09-26 MED ORDER — LIDOCAINE HCL (CARDIAC) 20 MG/ML IV SOLN
INTRAVENOUS | Status: DC | PRN
  Administered 2013-09-26: 20 mg via INTRAVENOUS

## 2013-09-26 MED ORDER — SODIUM CHLORIDE 0.9 % IV SOLN
INTRAVENOUS | Status: DC | PRN
  Administered 2013-09-26: 10:00:00 via INTRAVENOUS

## 2013-09-26 MED ORDER — SODIUM CHLORIDE 0.9 % IV SOLN: INTRAVENOUS | Status: DC

## 2013-09-26 NOTE — Discharge Instructions (Signed)

## 2013-09-26 NOTE — Procedures (Signed)
Electrical Cardioversion Procedure Note [name] [name] [id number] August 25, 1940  Procedure: Electrical Cardioversion Indications:  Atrial Flutter, atypical. Patient's INR today is 2.7 and has been therapeutic over the last month.   Procedure Details Consent: Risks of procedure as well as the alternatives and risks of each were explained to the (patient/caregiver).  Consent for procedure obtained. Time Out: Verified patient identification, verified procedure, site/side was marked, verified correct patient position, special equipment/implants available, medications/allergies/relevent history reviewed, required imaging and test results available.  Performed  Patient placed on cardiac monitor, pulse oximetry, supplemental oxygen as necessary.  Sedation given: Propofol per anesthesiology Pacer pads placed anterior and posterior chest.  Cardioverted 1 time(s).  Cardioverted at [place].  Evaluation Findings: Post procedure EKG shows: NSR Complications: None Patient did tolerate procedure well.   Loralie Champagne 09/26/2013, 10:19 AM

## 2013-09-26 NOTE — Interval H&P Note (Signed)
History and Physical Interval Note:  09/26/2013 10:06 AM  [name] [name]  has presented today for surgery, with the diagnosis of AFIB  The various methods of treatment have been discussed with the patient and family. After consideration of risks, benefits and other options for treatment, the patient has consented to  Procedure(s): CARDIOVERSION (N/A) as a surgical intervention .  The patient's history has been reviewed, patient examined, no change in status, stable for surgery.  I have reviewed the patient's chart and labs.  Questions were answered to the patient's satisfaction.     Dalton [employer]

## 2013-09-26 NOTE — Anesthesia Postprocedure Evaluation (Signed)
  Anesthesia Post-op Note  Patient: [name] [name]  Procedure(s) Performed: Procedure(s): CARDIOVERSION (N/A)  Patient Location: Endoscopy Unit  Anesthesia Type:MAC  Level of Consciousness: awake, alert , oriented and pateint cooperative  Airway and Oxygen Therapy: Patient Spontanous Breathing and Patient connected to nasal cannula oxygen  Post-op Pain: none  Post-op Assessment: Post-op Vital signs reviewed, Patient's Cardiovascular Status Stable, Respiratory Function Stable, Patent Airway and No signs of Nausea or vomiting  Post-op Vital Signs: Reviewed and stable  Last Vitals:  Filed Vitals:   09/26/13 0957  BP: 135/75  Pulse:   Temp:   Resp: 15    Complications: No apparent anesthesia complications

## 2013-09-26 NOTE — H&P (View-Only) (Signed)
Patient ID: [name] [name], male   DOB: March 18, 1940, 73 y.o.   MRN: [id number] PCP: Dr. 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  I started him on dronedarone and coumadin with plan for cardioversion after 4 wks of therapeutic anticoagulation if he did not convert back to NSR on his own.  He went back into NSR spontaneously and later stopped coumadin. Atrial flutter recurred in 3/15 and again resolved spontaneously.  In 6/15, patient was admitted with transient dysarthria (resolved in about 10 minutes).  However, he was found to have evidence for CVA by MRI.  Therefore, he was started on coumadin. He was noted to be in atypical atrial flutter again.  Ranolazine was added to dronedarone.    Today, patient remains in atrial flutter, rate-controlled.  He has had no further neurological symptoms.  He feels ok overall with no exertional dyspnea though he has not been very active.  No chest pain. No lightheadedness. INR was therapeutic on 6/19.    ECG (12/12/11): Atypical atrial flutter versus atrial tachycardia with rate around 100 bpm, QTc normal.  ECG (12/29/11): NSR, 1st degree AV block PR 242 msec.    ECG (12/13): NSR, 1st degree AV block PR 244 msec    ECG (10/14): NSR, 1st degree AV block ECG (3/15): Atypical atrial flutter with rate 87, inferior T wave inversions ECG (6/15): Atypical atrial flutter with rate 77  Labs (3/14): LDL 64, TSH normal, K 3.9, creatinine 1.0     Labs (6/15): LDL 54, HDL 48, creatinine 0.95                                                                                                                                   PMH: 1. Atrial fibrillation: s/p MAZE and LAA ligation in 2006.  No documented atrial fibrillation since operation.  2. Atypical  atrial flutter: 10/13, 3/15, and again in 6/15.  3. 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  Last echo here in 5/11 with EF 60-65%, normal bioprosthetic aortic valve, normal bioprosthetic mitral valve, moderate LAE.  Echo 11/12 at [facility] looked ok per report. Echo (10/13) with EF 55-60%, mild LVH, normal bioprosthetic aortic and mitral valves, moderate LAE, PA systolic pressure 35 mmHg.  Echo (6/15) with EF 60%, moderate LVH, normal-appearing bioprosthetic MV and AoV, PA systolic pressure 32 mmHg.  4. Hyperlipidemia 5. L-spine surgery 6. LHC with no significant coronary disease in 2006 pre-op.  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  SH: Married, retired Forensic psychologist [name] [name], [name] [name], [name] [name]).  2 sons.  Never smoked.   FH: No premature CAD.   ROS: All systems reviewed and  negative except as per HPI.  Current Outpatient Prescriptions  Medication Sig Dispense Refill  . atorvastatin (LIPITOR) 80 MG tablet Take 1 tablet (80 mg total) by mouth daily.  90 tablet  3  . Bilberry 60 MG CAPS Take by mouth daily.      . Coenzyme Q10-Vitamin E 100-300 MG-UNIT WAFR Take by mouth daily.      . Cyanocobalamin (VITAMIN B 12 PO) Take 1 tablet by mouth daily.      [name] dronedarone (MULTAQ) 400 MG tablet Take 1 tablet (400 mg total) by mouth 2 (two) times daily with a meal.  180 tablet  3  . fish oil-omega-3 fatty acids 1000 MG capsule Take 3 g by mouth daily.      [name] loratadine (CLARITIN) 10 MG tablet Take 10 mg by mouth as needed for allergies.      . metoprolol succinate (TOPROL-XL) 50 MG 24 hr tablet Take 1 tablet (50 mg total) by mouth daily. Take with or immediately following a meal.  90 tablet  3  . multivitamin (THERAGRAN) per tablet Take 1 tablet by mouth daily.      . Omega-3 Fatty Acids (FISH OIL) 1000 MG CAPS Take 1,000 mg by mouth 2 (two) times daily.      . Probiotic Product (PROBIOTIC FORMULA PO) Take 2  capsules by mouth daily.       . ranolazine (RANEXA) 500 MG 12 hr tablet Take 1 tablet (500 mg total) by mouth 2 (two) times daily.  180 tablet  3  . sildenafil (VIAGRA) 100 MG tablet Take 1 tablet (100 mg total) by mouth as needed.  10 tablet  6  . warfarin (COUMADIN) 5 MG tablet Take as directed by coumadin clinic  50 tablet  3   No current facility-administered medications for this visit.    BP 126/82  Pulse 77  Ht 5\' 8"  (1.727 m)  Wt 87.544 kg (193 lb)  BMI 29.35 kg/m2  SpO2 96% General: NAD Neck: No JVD, no thyromegaly or thyroid nodule.  Lungs: Clear to auscultation bilaterally with normal respiratory effort. CV: Nondisplaced PMI.  Heart irregular S1/S2, no S3/S4, 1/6 early SEM RUSB.  No peripheral edema.  No carotid bruit.  Normal pedal pulses.  Abdomen: Soft, nontender, no hepatosplenomegaly, no distention.  Neurologic: Alert and oriented x 3.  Psych: Normal affect. Extremities: No clubbing or cyanosis.   Assessment/Plan: 1. Atypical atrial flutter: Patient had a MAZE procedure with LAA ligation at the time of his valve replacement.  Since then, he has had occasional episodes of atypical atrial flutter. He had episodes in 10/13 and 3/15 that converted back to NSR on dronedarone. He was noted to be back in atrial flutter in 6/15 in the setting of a CVA.  - Continue coumadin (not using NOAC due to valvular atrial fibrillation, do not have good data).  - Continue current Toprol XL. - He will continue dronedarone and ranolazine (ranolazine used for antiarrhythmic properties).  - Check INR weekly, DCCV after 3 more therapeutic INR readings.  - He would like to go to [facility] for atypical atrial flutter ablation (had valve replacements there). Dr. Caryl Comes is arranging.  2. 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  Valves looked normal on 6/15 echo.   3. HTN: BP is reasonably controlled.  4. Hyperlipidemia: Good lipids in 6/15 on atorvastatin.    5. CVA: Suspect this was related to atrial flutter (despite LA appendage ligation).  He is now on  coumadin. No residual neurologic deficits.   Loralie Champagne 08/27/2013

## 2013-09-26 NOTE — Anesthesia Preprocedure Evaluation (Signed)
Anesthesia Evaluation  Patient identified by MRN, date of birth, ID band Patient awake    Reviewed: Allergy & Precautions, H&P , NPO status , Patient's Chart, lab work & pertinent test results, reviewed documented beta blocker date and time   History of Anesthesia Complications Negative for: history of anesthetic complications  Airway       Dental   Pulmonary neg pulmonary ROS,          Cardiovascular hypertension, Pt. on medications and Pt. on home beta blockers + Peripheral Vascular Disease + dysrhythmias Atrial Fibrillation     Neuro/Psych CVA, No Residual Symptoms    GI/Hepatic   Endo/Other    Renal/GU      Musculoskeletal   Abdominal   Peds  Hematology   Anesthesia Other Findings 7/06 s/p AVR and MVR  2015 echo:  EF 55-60%    Reproductive/Obstetrics                           Anesthesia Physical Anesthesia Plan  ASA: III  Anesthesia Plan: MAC   Post-op Pain Management:    Induction:   Airway Management Planned: Mask  Additional Equipment:   Intra-op Plan:   Post-operative Plan:   Informed Consent: I have reviewed the patients History and Physical, chart, labs and discussed the procedure including the risks, benefits and alternatives for the proposed anesthesia with the patient or authorized representative who has indicated his/her understanding and acceptance.   Dental advisory given  Plan Discussed with:   Anesthesia Plan Comments:         Anesthesia Quick Evaluation

## 2013-09-26 NOTE — Progress Notes (Signed)
Is it Dr Esperanza Heir at the [facility]?

## 2013-09-26 NOTE — Anesthesia Postprocedure Evaluation (Signed)
  Anesthesia Post-op Note  Patient: [name] [name]  Procedure(s) Performed: Procedure(s): CARDIOVERSION (N/A)  Patient Location: PACU  Anesthesia Type:General  Level of Consciousness: awake, alert  and oriented  Airway and Oxygen Therapy: Patient Spontanous Breathing and Patient connected to nasal cannula oxygen  Post-op Pain: mild  Post-op Assessment: Post-op Vital signs reviewed, Patient's Cardiovascular Status Stable, Respiratory Function Stable, Patent Airway and Pain level controlled  Post-op Vital Signs: stable  Last Vitals:  Filed Vitals:   09/26/13 1055  BP: 119/75  Pulse: 49  Temp:   Resp: 16    Complications: No apparent anesthesia complications

## 2013-09-26 NOTE — Transfer of Care (Signed)
Immediate Anesthesia Transfer of Care Note  Patient: [name] [name]  Procedure(s) Performed: Procedure(s): CARDIOVERSION (N/A)  Patient Location: Endoscopy Unit  Anesthesia Type:MAC  Level of Consciousness: awake, alert , oriented and patient cooperative  Airway & Oxygen Therapy: Patient Spontanous Breathing and Patient connected to nasal cannula oxygen  Post-op Assessment: Report given to PACU RN, Post -op Vital signs reviewed and stable and Patient moving all extremities  Post vital signs: Reviewed and stable  Complications: No apparent anesthesia complications

## 2013-09-27 ENCOUNTER — Encounter (HOSPITAL_COMMUNITY): Payer: Self-pay | Admitting: Cardiology

## 2013-10-07 ENCOUNTER — Ambulatory Visit (INDEPENDENT_AMBULATORY_CARE_PROVIDER_SITE_OTHER): Payer: Medicare Other | Admitting: Pharmacist

## 2013-10-07 DIAGNOSIS — Z5181 Encounter for therapeutic drug level monitoring: Secondary | ICD-10-CM | POA: Diagnosis not present

## 2013-10-07 DIAGNOSIS — I4891 Unspecified atrial fibrillation: Secondary | ICD-10-CM | POA: Diagnosis not present

## 2013-10-07 DIAGNOSIS — I6340 Cerebral infarction due to embolism of unspecified cerebral artery: Secondary | ICD-10-CM | POA: Diagnosis not present

## 2013-10-07 DIAGNOSIS — I4892 Unspecified atrial flutter: Secondary | ICD-10-CM | POA: Diagnosis not present

## 2013-10-07 LAB — POCT INR: INR: 2.5

## 2013-10-14 ENCOUNTER — Ambulatory Visit (INDEPENDENT_AMBULATORY_CARE_PROVIDER_SITE_OTHER): Payer: Medicare Other | Admitting: Neurology

## 2013-10-14 ENCOUNTER — Encounter: Payer: Self-pay | Admitting: Neurology

## 2013-10-14 VITALS — BP 112/78 | HR 76 | Ht 66.5 in | Wt 203.0 lb

## 2013-10-14 DIAGNOSIS — Z7901 Long term (current) use of anticoagulants: Secondary | ICD-10-CM | POA: Diagnosis not present

## 2013-10-14 DIAGNOSIS — I4891 Unspecified atrial fibrillation: Secondary | ICD-10-CM | POA: Diagnosis not present

## 2013-10-14 DIAGNOSIS — I10 Essential (primary) hypertension: Secondary | ICD-10-CM | POA: Diagnosis not present

## 2013-10-14 DIAGNOSIS — I482 Chronic atrial fibrillation, unspecified: Secondary | ICD-10-CM

## 2013-10-14 DIAGNOSIS — I6340 Cerebral infarction due to embolism of unspecified cerebral artery: Secondary | ICD-10-CM | POA: Diagnosis not present

## 2013-10-14 NOTE — Progress Notes (Signed)
STROKE NEUROLOGY FOLLOW UP NOTE  NAME: [name] [name] DOB: 10/28/1940  REASON FOR VISIT: stroke follow up HISTORY FROM:   Today we had the pleasure of seeing [name] [name] in follow-up at our Neurology Clinic. Pt was accompanied by no one.   History Summary [name] [name] is a 73 y.o. 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 He was off coumadin for a while as per cardiology before the admission on 08/05/13 due to acute onset expressive aphasia, right facial droop and right-sided numbness. This was completely resolved before ER arrival. Patient was not administered TPA secondary to symptoms resolved. In ER, he still showing in A flutter rhythm. After admission, MRI showed acute left frontal stroke but also with subacute left parietal infarct, consistent with embolic stroke. Cardiology was consulted and agreed with anticoagulation with coumadin. He was discharged with coumadin.   Interval History During the interval time, the patient has been doing well. He stated that he had cardioversion with [place] 3 weeks ago and for a week his heart rhythm is in sinus but for the last two weeks he was still in Afib or Aflutter. He is going to [place] clinic this week to see if he is a candidate for ablation to treat his heart rhythm but he is aware that he needs to take anticoagulation likely for life. He followed up with INR check and his INR was in good range between 2-3.   REVIEW OF SYSTEMS: Full 14 system review of systems performed and notable only for those listed below and in HPI above, all others are negative:  Constitutional: N/A  Cardiovascular: palpitation  Ear/Nose/Throat: N/A  Skin: N/A  Eyes: N/A  Respiratory: N/A  Gastroitestinal: N/A  Genitourinary: N/A Hematology/Lymphatic: N/A  Endocrine: N/A  Musculoskeletal:  N/A  Allergy/Immunology: N/A  Neurological: N/A  Psychiatric: N/A  The following represents the patient's updated allergies and side effects list: Allergies  Allergen Reactions  . Naproxen     REACTION: Hives    Labs since last visit of relevance include the following: Results for orders placed in visit on 10/07/13  POCT INR      Result Value Ref Range   INR 2.5      The neurologically relevant items on the patient's problem list were reviewed on today's visit.  Neurologic Examination  A problem focused neurological exam (12 or more points of the single system neurologic examination, vital signs counts as 1 point, cranial nerves count for 8 points) was performed.  Blood pressure 112/78, pulse 76, height 5' 6.5" (1.689 m), weight 203 lb (92.08 kg).  General - Well nourished, well developed, in no apparent distress.  Ophthalmologic - Sharp disc margins OU.  Cardiovascular - irregularly irregular heart rhythm.  Mental Status -  Level of arousal and orientation to time, place, and person were intact. Language including expression, naming, repetition, comprehension, reading, and writing was assessed and found intact. Attention span and concentration were normal. Recent and remote memory were 3/3 registration and 2/3 delayed recall. Fund of Knowledge was assessed and was intact.  Cranial Nerves II - XII -  II - Vision intact OU. III, IV, VI - Extraocular movements intact. V - Facial sensation intact bilaterally. VII - Facial movement intact bilaterally. VIII - Hearing & vestibular intact bilaterally. X - Palate elevates symmetrically. XI - Chin turning &  shoulder shrug intact bilaterally. XII - Tongue protrusion intact.  Motor Strength - The patient's strength was normal in all extremities and pronator drift was absent.  Bulk was normal and fasciculations were absent.   Motor Tone - Muscle tone was assessed at the neck and appendages and was normal.  Reflexes - The  patient's reflexes were normal in all extremities and he had no pathological reflexes.  Sensory - Light touch, temperature/pinprick, vibration and proprioception, and Romberg testing were assessed and were normal.    Coordination - The patient had normal movements in the hands and feet with no ataxia or dysmetria.  Tremor was absent.  Gait and Station - The patient's transfers, posture, gait, station, and turns were observed as normal.  Data reviewed: I personally reviewed the images and agree with the radiology interpretations.  CT of the brain 08/05/2013 1. 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 There is no evidence of an acute infarct. 2. Mild atrophy and chronic microvascular ischemic change. 3. No other abnormalities.  MRI of the brain 08/06/2013 Scattered small acute nonhemorrhagic left frontal lobe infarct. Subacute left parietal lobe infarct with laminar necrosis (less likely gyriform petechial hemorrhage). Tiny punctate region of blood breakdown products posterior left temporal lobe may reflect result of hemorrhage ischemia without intracranial hemorrhage otherwise noted. Mild to moderate small vessel disease type changes. Mild gyriform enhancement of the subacute left parietal lobe infarct otherwise no evidence of intracranial enhancing lesion or mass identified. Mild mucosal thickening inferior aspect right maxillary sinus. Transverse ligament hypertrophy with minimal anterior slippage of the C1 ring and mild posterior displacement of the dens contributing to moderate spinal stenosis and cord flattening greater on the left.  MRA of the brain 08/06/2013 Small caliber A2 segment of the left anterior cerebral artery with majority of the A2 segment supplied from the right. Anterior circulation without medium or large size vessel significant stenosis or occlusion. Mild middle cerebral artery branch vessel irregularity greater on the left. Right posterior inferior cerebellar artery and  anterior inferior cerebellar artery have a single origin from the proximal basilar artery. Similar appearance on the left slightly distal to this level. No significant stenosis of the distal vertebral arteries or basilar artery. Minimal irregularity distal posterior cerebral artery branch vessels.  MRA of the neck 08/06/2013 Ectatic 3 vessel aortic arch. Ectasia great vessels. Mild narrowing proximal right subclavian artery. Minimal narrowing proximal right vertebral artery. Mild ectasia proximal left vertebral artery. No significant stenosis of the vertebral arteries. No significant stenosis of either carotid artery. Prominent and ectasia of the vertical segment of the internal carotid arteries bilaterally.  2D Echocardiogram EF 55-60% with no source of embolus.  Carotid Doppler No evidence of hemodynamically significant internal carotid artery stenosis. Vertebral artery flow is antegrade.  CXR 08/06/2013 No acute cardiopulmonary disease.   Assessment: As you may recall, he is a 73 y.o. Caucasian male with a diagnosis of stroke followed up in clinic today. He presented to [facility] on 08/05/13 due to aphasia, right facial droop and right arm numbness. MRI showed acute left frontal stroke with subacute left parietal stroke, consistent with embolic stroke. Given his hx of rheumatic fever with mitral and aortic valve abnormality s/p valvular replacement and on afib with discontinued coumadin by cardiology, his stroke was high likely due to afib not on anticoagulation. Discussed with cardiology, he was put on coumadin. After discharge, he has been follow up with coumadin clinic and INR is on the goal between 2-3. He had cardioversion  3 weeks ago but currently he still in afib. He is going to [place] clinic this week for potential ablation but he understood that he needs coumadin likely for life. He also on high dose lipitor for a long time and his LDL last check was <70.   Plan:  - continue coumadin and INR 2-3.  -  continue high dose lipitor  - continue other meds - follow up with PCP and cardiologist regularly. - He will go to [place] clinic this week for catheter ablation but he needs anticoagulation for life with or without this procedure - check BP at home for stroke risk factor modification - RTC in 3 months.  Diagnoses from this visit: Long term (current) use of anticoagulants  Cerebral embolism with cerebral infarction  Essential hypertension  Chronic a-fib   Patient Instructions  1. Continue coumadin for stroke prevention. Check INR regularly 2. Continue high dose lipitor for stroke risk factor modification. 3. Continue other home meds 4. Check BP regularly at home 5. Continue follow up with cardiology and PCP at regular basis. 6. RTC in 3 months.   Rosalin Hawking, MD PhD [facility] [address] [place], [place] [address] [phone]

## 2013-10-14 NOTE — Patient Instructions (Signed)
1. Continue coumadin for stroke prevention. Check INR regularly 2. Continue high dose lipitor for stroke risk factor modification. 3. Continue other home meds 4. Check BP regularly at home 5. Continue follow up with cardiology and PCP at regular basis. 6. RTC in 3 months.

## 2013-10-17 ENCOUNTER — Encounter: Payer: Self-pay | Admitting: *Deleted

## 2013-10-17 DIAGNOSIS — R9431 Abnormal electrocardiogram [ECG] [EKG]: Secondary | ICD-10-CM | POA: Diagnosis not present

## 2013-10-17 DIAGNOSIS — I4892 Unspecified atrial flutter: Secondary | ICD-10-CM | POA: Diagnosis not present

## 2013-10-17 DIAGNOSIS — Z954 Presence of other heart-valve replacement: Secondary | ICD-10-CM | POA: Diagnosis not present

## 2013-10-17 DIAGNOSIS — I359 Nonrheumatic aortic valve disorder, unspecified: Secondary | ICD-10-CM | POA: Diagnosis not present

## 2013-10-17 DIAGNOSIS — I4891 Unspecified atrial fibrillation: Secondary | ICD-10-CM | POA: Diagnosis not present

## 2013-10-17 DIAGNOSIS — I059 Rheumatic mitral valve disease, unspecified: Secondary | ICD-10-CM | POA: Diagnosis not present

## 2013-10-17 DIAGNOSIS — Z8673 Personal history of transient ischemic attack (TIA), and cerebral infarction without residual deficits: Secondary | ICD-10-CM | POA: Diagnosis not present

## 2013-10-25 ENCOUNTER — Ambulatory Visit: Payer: Medicare Other | Admitting: Cardiology

## 2013-10-28 ENCOUNTER — Ambulatory Visit (INDEPENDENT_AMBULATORY_CARE_PROVIDER_SITE_OTHER): Payer: Medicare Other | Admitting: Cardiology

## 2013-10-28 ENCOUNTER — Encounter: Payer: Self-pay | Admitting: Cardiology

## 2013-10-28 ENCOUNTER — Ambulatory Visit (INDEPENDENT_AMBULATORY_CARE_PROVIDER_SITE_OTHER): Payer: Medicare Other | Admitting: Pharmacist

## 2013-10-28 VITALS — BP 118/78 | HR 82 | Ht 68.0 in | Wt 201.0 lb

## 2013-10-28 DIAGNOSIS — I4892 Unspecified atrial flutter: Secondary | ICD-10-CM

## 2013-10-28 DIAGNOSIS — Z5181 Encounter for therapeutic drug level monitoring: Secondary | ICD-10-CM | POA: Diagnosis not present

## 2013-10-28 DIAGNOSIS — Z9889 Other specified postprocedural states: Secondary | ICD-10-CM

## 2013-10-28 DIAGNOSIS — I4891 Unspecified atrial fibrillation: Secondary | ICD-10-CM | POA: Diagnosis not present

## 2013-10-28 DIAGNOSIS — I6340 Cerebral infarction due to embolism of unspecified cerebral artery: Secondary | ICD-10-CM

## 2013-10-28 LAB — POCT INR: INR: 2.5

## 2013-10-28 NOTE — Patient Instructions (Signed)
Your physician recommends that you schedule a follow-up appointment first of October.

## 2013-10-29 ENCOUNTER — Other Ambulatory Visit: Payer: Medicare Other

## 2013-10-29 ENCOUNTER — Encounter: Payer: Self-pay | Admitting: Nurse Practitioner

## 2013-10-29 ENCOUNTER — Ambulatory Visit (INDEPENDENT_AMBULATORY_CARE_PROVIDER_SITE_OTHER): Payer: Medicare Other | Admitting: Nurse Practitioner

## 2013-10-29 VITALS — BP 138/90 | HR 87 | Temp 98.5°F | Resp 18 | Wt 204.0 lb

## 2013-10-29 DIAGNOSIS — R300 Dysuria: Secondary | ICD-10-CM | POA: Diagnosis not present

## 2013-10-29 DIAGNOSIS — I6340 Cerebral infarction due to embolism of unspecified cerebral artery: Secondary | ICD-10-CM

## 2013-10-29 DIAGNOSIS — N138 Other obstructive and reflux uropathy: Secondary | ICD-10-CM | POA: Insufficient documentation

## 2013-10-29 DIAGNOSIS — R350 Frequency of micturition: Secondary | ICD-10-CM

## 2013-10-29 DIAGNOSIS — N400 Enlarged prostate without lower urinary tract symptoms: Secondary | ICD-10-CM | POA: Diagnosis not present

## 2013-10-29 DIAGNOSIS — N401 Enlarged prostate with lower urinary tract symptoms: Secondary | ICD-10-CM | POA: Insufficient documentation

## 2013-10-29 LAB — POCT URINALYSIS DIPSTICK
Bilirubin, UA: NEGATIVE
Glucose, UA: NEGATIVE
KETONES UA: NEGATIVE
Leukocytes, UA: NEGATIVE
Nitrite, UA: NEGATIVE
PROTEIN UA: 0.15
RBC UA: NEGATIVE
SPEC GRAV UA: 1.025
Urobilinogen, UA: >=8
pH, UA: 6

## 2013-10-29 MED ORDER — CEPHALEXIN 500 MG PO CAPS: 500.0000 mg | ORAL_CAPSULE | Freq: Four times a day (QID) | ORAL | Status: DC

## 2013-10-29 NOTE — Progress Notes (Signed)
Subjective:    Patient ID: [name] [name], male    DOB: 1940-11-12, 73 y.o.   MRN: [id number]  HPI  Patient is seen in office today for complaints of 4 day history of burning with urination.  He has noticed some frequency 2 days ago and mild pain in left lower pelvic area. Denies urgency, low back pain, fever or chills.  Denies nausea or vomiting.   Patient due to have ablation treatment for arrythmia within next 4 weeks.  History of enlarged prostate.    Review of Systems  Constitutional: Negative.  Negative for fever and chills.  Respiratory: Negative.  Negative for cough.   Cardiovascular: Positive for palpitations. Negative for chest pain.       Occasionally notices palpitations  Gastrointestinal: Positive for abdominal pain. Negative for nausea, vomiting and diarrhea.       Left lower pelvic pain noted intermittently.  Genitourinary: Positive for dysuria and urgency. Negative for frequency, flank pain and decreased urine volume.       Negative history for kidney stones. Denies hematuria  Skin: Negative.     Past Medical History  Diagnosis Date  . Psychosexual dysfunction with inhibited sexual excitement   . Other and unspecified hyperlipidemia   . 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 Lesion of ulnar nerve   . External hemorrhoids without mention of complication   . Acute rheumatic pericarditis   . Acute rheumatic heart disease, unspecified     childhood, age  25 & 27  . 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 Atrial fibrillation     history  . Diverticulosis     History   Social History  . Marital Status: Married    Spouse Name: N/A    Number of Children: 2  . Years of Education: BS   Occupational History  . retired    Social History Main Topics  . Smoking status: Never Smoker   . Smokeless tobacco: Never Used  . Alcohol Use: 4.2 oz/week    7 Glasses of wine per week     Comment: wine occasionally  . Drug Use: No  . Sexual Activity:  Yes    Partners: Female   Other Topics Concern  . Not on file   Social History Narrative   [name] - BS, [place]. married - '67. 2 sons - '74, '77  one son is a Nurse, learning disability for [employer]; 4 grandchildren. 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 marriage in good health. End-of-life: discussed issues and provided packet with the MOST form and out of facility order.    Past Surgical History  Procedure Laterality Date  . Laminectomies      10/1975 and in 03/2005  . 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 Cardioversion      3 times from 2004-2006  . Tonsillectomy      1950  . Colonoscopy    . Cardioversion N/A 09/26/2013    Procedure: CARDIOVERSION;  Surgeon: Larey Dresser, MD;  [facility] ENDOSCOPY;  Service: Cardiovascular;  Laterality: N/A;    Family History  Problem Relation Age of Onset  . Stroke Mother   . Macular degeneration Mother     macular degeneration  . Cancer Father     intestinal/GI  . Diabetes Paternal Aunt     Allergies  Allergen Reactions  .  Naproxen     REACTION: Hives    Current Outpatient Prescriptions on File Prior to Visit  Medication Sig Dispense Refill  . atorvastatin (LIPITOR) 80 MG tablet Take 1 tablet (80 mg total) by mouth daily.  90 tablet  3  . Bilberry 60 MG CAPS Take by mouth daily.      . Coenzyme Q10-Vitamin E 100-300 MG-UNIT WAFR Take by mouth daily.      . Cyanocobalamin (VITAMIN B 12 PO) Take 1 tablet by mouth daily.      [name] dronedarone (MULTAQ) 400 MG tablet Take 1 tablet (400 mg total) by mouth 2 (two) times daily with a meal.  180 tablet  3  . fish oil-omega-3 fatty acids 1000 MG capsule Take 3 g by mouth daily.      . fluocinonide cream (LIDEX) 8.50 % Apply 1 application topically 2 (two) times daily as needed (for rash).      . hydrocortisone cream 1 % Apply 1 application topically 2 (two) times daily as needed for itching.      .  loratadine (CLARITIN) 10 MG tablet Take 10 mg by mouth as needed for allergies.      . metoprolol succinate (TOPROL-XL) 50 MG 24 hr tablet Take 1 tablet (50 mg total) by mouth daily. Take with or immediately following a meal.  90 tablet  3  . multivitamin (THERAGRAN) per tablet Take 1 tablet by mouth daily.      . Probiotic Product (PROBIOTIC FORMULA PO) Take 2 capsules by mouth daily.       . ranolazine (RANEXA) 500 MG 12 hr tablet Take 1 tablet (500 mg total) by mouth 2 (two) times daily.  180 tablet  3  . saw palmetto 80 MG capsule Take 80 mg by mouth 2 (two) times daily.      . sildenafil (VIAGRA) 100 MG tablet Take 1 tablet (100 mg total) by mouth as needed.  10 tablet  6  . warfarin (COUMADIN) 5 MG tablet 5-7.5 mg daily at 6 PM. Takes 7.5 mg on Monday and Friday and 5 mg all other days       No current facility-administered medications on file prior to visit.    BP 138/90  Pulse 87  Temp(Src) 98.5 F (36.9 C) (Oral)  Resp 18  Wt 204 lb (92.534 kg)  SpO2 97%       Objective:   Physical Exam  Constitutional: He is oriented to person, place, and time. He appears well-developed and well-nourished. No distress.  HENT:  Head: Normocephalic.  Cardiovascular: Normal rate.   Rhythm irregular  Pulmonary/Chest: Effort normal and breath sounds normal. No respiratory distress. He has no wheezes. He has no rales.  Abdominal: Soft. Bowel sounds are normal. He exhibits no distension and no mass. There is tenderness. There is no rebound and no guarding.  Noted tenderness on palpation over left lower pelvic region pain level 1-2/10  Genitourinary:  Prostate firm, enlarged, non tender on palpation  Neurological: He is alert and oriented to person, place, and time.  Skin: Skin is warm and dry.  Psychiatric: He has a normal mood and affect. His behavior is normal.          Assessment & Plan:  1. Burning with urination Adequtte fluid intake - POCT urinalysis dipstick - Urine Culture;  Future Urine for culture and sensitivity - cephALEXin (KEFLEX) 500 MG capsule; Take 1 capsule (500 mg total) by mouth 4 (four) times daily.  Dispense: 40 capsule; Refill:  0 Consulted with Dr. Jenny Reichmann  Regarding best treatment plan for patient.  Verbal Order give patient Cephalexin 500mg  1 po qid #40.  By Dr. Jenny Reichmann   2. Frequency of urination See above  - cephALEXin (KEFLEX) 500 MG capsule; Take 1 capsule (500 mg total) by mouth 4 (four) times daily.  Dispense: 40 capsule; Refill: 0  3. Prostate enlargement As noted on physical exam.  Patient instructed to call clinic if symptoms worsen, develops chills, fever , nausea, vomiting, or not resolved within 10 days.

## 2013-10-29 NOTE — Patient Instructions (Signed)
Adequate fluid intake Take medications as prescribed Call clinic if symptoms worsen , not resolved.  If you develop a fever , chills, nausea, back pain, vomiting, or blood in urine.  Keep your scheduled appointment with your Primary Care Physicican Call clinic with questions or concerns  Urinary Tract Infection Urinary tract infections (UTIs) can develop anywhere along your urinary tract. Your urinary tract is your body's drainage system for removing wastes and extra water. Your urinary tract includes two kidneys, two ureters, a bladder, and a urethra. Your kidneys are a pair of bean-shaped organs. Each kidney is about the size of your fist. They are located below your ribs, one on each side of your spine. CAUSES Infections are caused by microbes, which are microscopic organisms, including fungi, viruses, and bacteria. These organisms are so small that they can only be seen through a microscope. Bacteria are the microbes that most commonly cause UTIs. SYMPTOMS  Symptoms of UTIs may vary by age and gender of the patient and by the location of the infection. Symptoms in young women typically include a frequent and intense urge to urinate and a painful, burning feeling in the bladder or urethra during urination. Older women and men are more likely to be tired, shaky, and weak and have muscle aches and abdominal pain. A fever may mean the infection is in your kidneys. Other symptoms of a kidney infection include pain in your back or sides below the ribs, nausea, and vomiting. DIAGNOSIS To diagnose a UTI, your caregiver will ask you about your symptoms. Your caregiver also will ask to provide a urine sample. The urine sample will be tested for bacteria and white blood cells. White blood cells are made by your body to help fight infection. TREATMENT  Typically, UTIs can be treated with medication. Because most UTIs are caused by a bacterial infection, they usually can be treated with the use of antibiotics.  The choice of antibiotic and length of treatment depend on your symptoms and the type of bacteria causing your infection. HOME CARE INSTRUCTIONS  If you were prescribed antibiotics, take them exactly as your caregiver instructs you. Finish the medication even if you feel better after you have only taken some of the medication.  Drink enough water and fluids to keep your urine clear or pale yellow.  Avoid caffeine, tea, and carbonated beverages. They tend to irritate your bladder.  Empty your bladder often. Avoid holding urine for long periods of time.  Empty your bladder before and after sexual intercourse.  After a bowel movement, women should cleanse from front to back. Use each tissue only once. SEEK MEDICAL CARE IF:   You have back pain.  You develop a fever.  Your symptoms do not begin to resolve within 3 days. SEEK IMMEDIATE MEDICAL CARE IF:   You have severe back pain or lower abdominal pain.  You develop chills.  You have nausea or vomiting.  You have continued burning or discomfort with urination. MAKE SURE YOU:   Understand these instructions.  Will watch your condition.  Will get help right away if you are not doing well or get worse. Document Released: 12/01/2004 Document Revised: 08/23/2011 Document Reviewed: 04/01/2011 [facility] Patient Information 2015 [place], [place]. This information is not intended to replace advice given to you by your health care provider. Make sure you discuss any questions you have with your health care provider.

## 2013-10-29 NOTE — Progress Notes (Signed)
Pre visit review using our clinic review tool, if applicable. No additional management support is needed unless otherwise documented below in the visit note. 

## 2013-10-29 NOTE — Progress Notes (Signed)
Patient ID: [name] [name], male   DOB: Apr 09, 1940, 73 y.o.   MRN: [id number] PCP: Dr. Linda Hedges  73 yo with history of rheumatic fever and AI/[name] s/p bioprosthetic AVR and MVR at the [facility] as well as MAZE procedure all in 2006 presents for cardiology evaluation.  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  I started him on dronedarone and coumadin with plan for cardioversion after 4 wks of therapeutic anticoagulation if he did not convert back to NSR on his own.  He went back into NSR spontaneously and later stopped coumadin. Atrial flutter recurred in 3/15 and again resolved spontaneously.  In 6/15, patient was admitted with transient dysarthria (resolved in about 10 minutes).  However, he was found to have evidence for CVA by MRI.  Therefore, he was started on coumadin. He was noted to be in atypical atrial flutter again.  Ranolazine was added to dronedarone.    In 8/15, [name] [name] was seen in consultation at the [facility] clinic.  Plan is going to be for him to have atrial flutter ablation there in 9/15.   Today, patient remains in atrial flutter, rate-controlled.  He has had no further neurological symptoms.  He feels ok overall with no exertional dyspnea though he has not been very active.  No chest pain. No lightheadedness.    ECG (12/12/11): Atypical atrial flutter versus atrial tachycardia with rate around 100 bpm, QTc normal.  ECG (12/29/11): NSR, 1st degree AV block PR 242 msec.    ECG (12/13): NSR, 1st degree AV block PR 244 msec    ECG (10/14): NSR, 1st degree AV block ECG (3/15): Atypical atrial flutter with rate 87, inferior T wave inversions ECG (6/15): Atypical atrial flutter with rate 77 ECG: (8/15): atypical atrial flutter rate 82  Labs (3/14): LDL 64, TSH normal, K 3.9, creatinine 1.0     Labs (6/15): LDL 54, HDL 48, creatinine 0.95                                    PMH: 1. Atrial fibrillation: s/p MAZE and LAA ligation in 2006.  No documented atrial fibrillation since operation.  2. Atypical atrial flutter: 10/13, 3/15, and again in 6/15.  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  Last echo here in 5/11 with EF 60-65%, normal bioprosthetic aortic valve, normal bioprosthetic mitral valve, moderate LAE.  Echo 11/12 at [facility] looked ok per report. Echo (10/13) with EF 55-60%, mild LVH, normal bioprosthetic aortic and mitral valves, moderate LAE, PA systolic pressure 35 mmHg.  Echo (6/15) with EF 60%, moderate LVH, normal-appearing bioprosthetic MV and AoV, PA systolic pressure 32 mmHg.  4. Hyperlipidemia 5. L-spine surgery 6. LHC with no significant coronary disease in 2006 pre-op.  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  SH: Married, retired Forensic psychologist [name] [name], [name] [name], [name] [name]).  2 sons.  Never smoked.   FH: No premature CAD.   ROS: All systems reviewed and negative except as per HPI.  Current Outpatient Prescriptions  Medication Sig Dispense Refill  . atorvastatin (LIPITOR) 80 MG tablet Take 1 tablet (80 mg total) by mouth daily.  90 tablet  3  . Bilberry 60 MG CAPS Take by mouth daily.      . Coenzyme Q10-Vitamin E 100-300 MG-UNIT WAFR Take by mouth daily.      [name]  Cyanocobalamin (VITAMIN B 12 PO) Take 1 tablet by mouth daily.      [name] dronedarone (MULTAQ) 400 MG tablet Take 1 tablet (400 mg total) by mouth 2 (two) times daily with a meal.  180 tablet  3  . fish oil-omega-3 fatty acids 1000 MG capsule Take 3 g by mouth daily.      . fluocinonide cream (LIDEX) 5.45 % Apply 1 application topically 2 (two) times daily as needed (for rash).      . hydrocortisone cream 1 % Apply 1 application topically 2 (two) times daily as needed for itching.      . loratadine (CLARITIN) 10 MG tablet Take 10 mg by mouth as needed for allergies.       . metoprolol succinate (TOPROL-XL) 50 MG 24 hr tablet Take 1 tablet (50 mg total) by mouth daily. Take with or immediately following a meal.  90 tablet  3  . multivitamin (THERAGRAN) per tablet Take 1 tablet by mouth daily.      . Probiotic Product (PROBIOTIC FORMULA PO) Take 2 capsules by mouth daily.       . ranolazine (RANEXA) 500 MG 12 hr tablet Take 1 tablet (500 mg total) by mouth 2 (two) times daily.  180 tablet  3  . saw palmetto 80 MG capsule Take 80 mg by mouth 2 (two) times daily.      . sildenafil (VIAGRA) 100 MG tablet Take 1 tablet (100 mg total) by mouth as needed.  10 tablet  6  . warfarin (COUMADIN) 5 MG tablet 5-7.5 mg daily at 6 PM. Takes 7.5 mg on Monday and Friday and 5 mg all other days       No current facility-administered medications for this visit.    BP 118/78  Pulse 82  Ht 5\' 8"  (1.727 m)  Wt 201 lb (91.173 kg)  BMI 30.57 kg/m2 General: NAD Neck: JVP 8 cm, no thyromegaly or thyroid nodule.  Lungs: Clear to auscultation bilaterally with normal respiratory effort. CV: Nondisplaced PMI.  Heart irregular S1/S2, no S3/S4, 2/6 early SEM RUSB.  No peripheral edema.  No carotid bruit.  Normal pedal pulses.  Abdomen: Soft, nontender, no hepatosplenomegaly, no distention.  Neurologic: Alert and oriented x 3.  Psych: Normal affect. Extremities: No clubbing or cyanosis.   Assessment/Plan: 1. Atypical atrial flutter: Patient had a MAZE procedure with LAA ligation at the time of his valve replacement.  Since then, he has had occasional episodes of atypical atrial flutter. He had episodes in 10/13 and 3/15 that converted back to NSR on dronedarone. He was noted to be back in atrial flutter in 6/15 in the setting of a CVA, this has been persistent.  - Continue coumadin (not using NOAC due to valvular atrial fibrillation, do not have good data).  - Continue current Toprol XL. - He will continue dronedarone and ranolazine (ranolazine used for antiarrhythmic properties) for  now.  - Check INR weekly, will have atrial flutter ablation at [facility] in 9/15.  2. 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  Valves looked normal on 6/15 echo.   3. HTN: BP is reasonably controlled.  4. Hyperlipidemia: Good lipids in 6/15 on atorvastatin.   5. CVA: Suspect this was related to atrial flutter (despite LA appendage ligation).  He is now on coumadin. No residual neurologic deficits.   Loralie Champagne 10/29/2013

## 2013-10-30 LAB — URINE CULTURE
COLONY COUNT: NO GROWTH
Organism ID, Bacteria: NO GROWTH

## 2013-10-31 ENCOUNTER — Ambulatory Visit: Payer: Medicare Other | Admitting: Nurse Practitioner

## 2013-11-04 ENCOUNTER — Ambulatory Visit (INDEPENDENT_AMBULATORY_CARE_PROVIDER_SITE_OTHER): Payer: Medicare Other

## 2013-11-04 DIAGNOSIS — I6340 Cerebral infarction due to embolism of unspecified cerebral artery: Secondary | ICD-10-CM | POA: Diagnosis not present

## 2013-11-04 DIAGNOSIS — I4891 Unspecified atrial fibrillation: Secondary | ICD-10-CM | POA: Diagnosis not present

## 2013-11-04 DIAGNOSIS — Z5181 Encounter for therapeutic drug level monitoring: Secondary | ICD-10-CM

## 2013-11-04 DIAGNOSIS — I4892 Unspecified atrial flutter: Secondary | ICD-10-CM | POA: Diagnosis not present

## 2013-11-04 LAB — POCT INR: INR: 3

## 2013-11-12 ENCOUNTER — Ambulatory Visit (INDEPENDENT_AMBULATORY_CARE_PROVIDER_SITE_OTHER): Payer: Medicare Other | Admitting: *Deleted

## 2013-11-12 DIAGNOSIS — I6340 Cerebral infarction due to embolism of unspecified cerebral artery: Secondary | ICD-10-CM | POA: Diagnosis not present

## 2013-11-12 DIAGNOSIS — Z5181 Encounter for therapeutic drug level monitoring: Secondary | ICD-10-CM | POA: Diagnosis not present

## 2013-11-12 DIAGNOSIS — I4891 Unspecified atrial fibrillation: Secondary | ICD-10-CM | POA: Diagnosis not present

## 2013-11-12 LAB — POCT INR: INR: 4

## 2013-11-19 ENCOUNTER — Ambulatory Visit (INDEPENDENT_AMBULATORY_CARE_PROVIDER_SITE_OTHER): Payer: Medicare Other | Admitting: *Deleted

## 2013-11-19 DIAGNOSIS — I4891 Unspecified atrial fibrillation: Secondary | ICD-10-CM | POA: Diagnosis not present

## 2013-11-19 DIAGNOSIS — Z5181 Encounter for therapeutic drug level monitoring: Secondary | ICD-10-CM

## 2013-11-19 DIAGNOSIS — I6340 Cerebral infarction due to embolism of unspecified cerebral artery: Secondary | ICD-10-CM | POA: Diagnosis not present

## 2013-11-19 LAB — POCT INR: INR: 3.3

## 2013-11-22 ENCOUNTER — Telehealth: Payer: Self-pay | Admitting: *Deleted

## 2013-11-22 ENCOUNTER — Ambulatory Visit (INDEPENDENT_AMBULATORY_CARE_PROVIDER_SITE_OTHER): Payer: Medicare Other | Admitting: Family Medicine

## 2013-11-22 ENCOUNTER — Telehealth: Payer: Self-pay | Admitting: Internal Medicine

## 2013-11-22 ENCOUNTER — Encounter: Payer: Self-pay | Admitting: Family Medicine

## 2013-11-22 VITALS — BP 122/80 | HR 110 | Temp 99.2°F | Ht 68.0 in | Wt 206.0 lb

## 2013-11-22 DIAGNOSIS — I6340 Cerebral infarction due to embolism of unspecified cerebral artery: Secondary | ICD-10-CM | POA: Diagnosis not present

## 2013-11-22 DIAGNOSIS — R300 Dysuria: Secondary | ICD-10-CM

## 2013-11-22 LAB — POCT URINALYSIS DIPSTICK
Bilirubin, UA: NEGATIVE
Glucose, UA: NEGATIVE
Ketones, UA: NEGATIVE
NITRITE UA: POSITIVE
Protein, UA: 30
Spec Grav, UA: <=1.005
UROBILINOGEN UA: 0.2
pH, UA: 6.5

## 2013-11-22 MED ORDER — CIPROFLOXACIN HCL 500 MG PO TABS: 500.0000 mg | ORAL_TABLET | Freq: Two times a day (BID) | ORAL | Status: DC

## 2013-11-22 NOTE — Telephone Encounter (Signed)
[facility] Triage Call Report Triage Record Num: [id number] Operator: Argie Ramming Patient Name: [name] [name] Call Date & Time: 11/21/2013 9:13:59PM Patient Phone: [phone] PCP: Scarlette Calico Patient Gender: Male PCP Fax : Patient DOB: [birth year] Practice Name: [facility] Reason for Call: Caller: Youssouf/Patient; PCP: Scarlette Calico (Adults only); CB#: [phone]; Onset: 11/21/13; Temp 99.9 Call regarding reoccuring UTI, last treated with Keflex a few weeks ago. Currently has a fever, burning when urinating and frequent urination. Triaged Urinary Symptoms. + for has one or more urinary tract symptoms and has not been previously evaluated. Disposition: See provider within 24 hours. Care advice: per gudielines. No appointments available for [place] office, advised patient may hae to use an urgent care. He states he will follow up tomorrow with the office. Protocol(s) Used: Urinary Symptoms - Male Recommended Outcome per Protocol: See Provider within 24 hours Reason for Outcome: Has one or more urinary tract symptoms AND has not been previously evaluated Care Advice: ~ Call provider if you develop flank or low back pain, fever, chills, or generally feel sick. Increase intake of fluids. Try to drink 8 oz. (.2 liter) every hour when awake, unless on restricted fluids for other medical reasons. Include at least two 8 oz. (.2 liter) glasses of unsweetened cranberry juice each day. Take sips of fluid or eat ice chips if nauseated or vomiting. ~ 09/

## 2013-11-22 NOTE — Telephone Encounter (Signed)
Pt called to ask how long should he stand on this medication ciprofloxacin (CIPRO) 500 MG tablet  Pt would like a call back

## 2013-11-22 NOTE — Patient Instructions (Signed)
-  As we discussed, we have prescribed a new medication for you at this appointment. We discussed the common and serious potential adverse effects of this medication and you can review these and more with the pharmacist when you pick up your medication.  Please follow the instructions for use carefully and notify us immediately if you have any problems taking this medication.  -We have ordered labs or studies at this visit. It can take up to 1-2 weeks for results and processing. We will contact you with instructions IF your results are abnormal. Normal results will be released to your [facility]. If you have not heard from [place] or can not find your results in [facility] in 2 weeks please contact our office.  -follow up with your doctor in the next 2-4 weeks to ensure resolution or sooner if needed, seek care immediately if any significant bleeding or if worsening

## 2013-11-22 NOTE — Progress Notes (Signed)
No chief complaint on file.   HPI:  Acute visit for:  1) Dysuria: -started a yesterday -reports treated for UTI a few weeks ago with keflex -on [facility] urine culture with no growth at the time -symptoms: dysuria, frequency, urgency, hematuria - mild -denies: fevers, nvd, sl hematuria, abd pr flank pain, rectal pain  ROS: See pertinent positives and negatives per HPI.  Past Medical History  Diagnosis Date  . Psychosexual dysfunction with inhibited sexual excitement   . Other and unspecified hyperlipidemia   . 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 Lesion of ulnar nerve   . External hemorrhoids without mention of complication   . Acute rheumatic pericarditis   . Acute rheumatic heart disease, unspecified     childhood, age  46 & 38  . 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 Atrial fibrillation     history  . Diverticulosis     Past Surgical History  Procedure Laterality Date  . Laminectomies      10/1975 and in 03/2005  . 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 Cardioversion      3 times from 2004-2006  . Tonsillectomy      1950  . Colonoscopy    . Cardioversion N/A 09/26/2013    Procedure: CARDIOVERSION;  Surgeon: Larey Dresser, MD;  Location: [facility] ENDOSCOPY;  Service: Cardiovascular;  Laterality: N/A;    Family History  Problem Relation Age of Onset  . Stroke Mother   . Macular degeneration Mother     macular degeneration  . Cancer Father     intestinal/GI  . Diabetes Paternal Aunt     History   Social History  . Marital Status: Married    Spouse Name: N/A    Number of Children: 2  . Years of Education: BS   Occupational History  . retired    Social History Main Topics  . Smoking status: Never Smoker   . Smokeless tobacco: Never Used  . Alcohol Use: 4.2 oz/week    7 Glasses of wine per week     Comment: wine occasionally  . Drug Use: No  . Sexual Activity: Yes    Partners: Female   Other Topics Concern  . None    Social History Narrative   [place] - BS, JD. married - '67. 2 sons - '74, '77  one son is a Nurse, learning disability for EMS - [employer]; 4 grandchildren. 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 marriage in good health. End-of-life: discussed issues and provided packet with the MOST form and out of facility order.    Current outpatient prescriptions:atorvastatin (LIPITOR) 80 MG tablet, Take 1 tablet (80 mg total) by mouth daily., Disp: 90 tablet, Rfl: 3;  Bilberry 60 MG CAPS, Take by mouth daily., Disp: , Rfl: ;  Coenzyme Q10-Vitamin E 100-300 MG-UNIT WAFR, Take by mouth daily., Disp: , Rfl: ;  Cyanocobalamin (VITAMIN B 12 PO), Take 1 tablet by mouth daily., Disp: , Rfl:  dronedarone (MULTAQ) 400 MG tablet, Take 1 tablet (400 mg total) by mouth 2 (two) times daily with a meal., Disp: 180 tablet, Rfl: 3;  fish oil-omega-3 fatty acids 1000 MG capsule, Take 3 g by mouth daily., Disp: , Rfl: ;  fluocinonide cream (LIDEX) 5.09 %, Apply 1 application topically 2 (two) times daily as needed (for rash)., Disp: , Rfl:  hydrocortisone  cream 1 %, Apply 1 application topically 2 (two) times daily as needed for itching., Disp: , Rfl: ;  loratadine (CLARITIN) 10 MG tablet, Take 10 mg by mouth as needed for allergies., Disp: , Rfl: ;  metoprolol succinate (TOPROL-XL) 50 MG 24 hr tablet, Take 1 tablet (50 mg total) by mouth daily. Take with or immediately following a meal., Disp: 90 tablet, Rfl: 3 multivitamin (THERAGRAN) per tablet, Take 1 tablet by mouth daily., Disp: , Rfl: ;  Probiotic Product (PROBIOTIC FORMULA PO), Take 2 capsules by mouth daily. , Disp: , Rfl: ;  ranolazine (RANEXA) 500 MG 12 hr tablet, Take 1 tablet (500 mg total) by mouth 2 (two) times daily., Disp: 180 tablet, Rfl: 3;  saw palmetto 80 MG capsule, Take 80 mg by mouth 2 (two) times daily., Disp: , Rfl:  sildenafil (VIAGRA) 100 MG tablet, Take 1 tablet (100 mg total) by mouth as  needed., Disp: 10 tablet, Rfl: 6;  warfarin (COUMADIN) 5 MG tablet, 5-7.5 mg daily at 6 PM. Takes 7.5 mg on Monday and Friday and 5 mg all other days, Disp: , Rfl: ;  ciprofloxacin (CIPRO) 500 MG tablet, Take 1 tablet (500 mg total) by mouth 2 (two) times daily., Disp: 6 tablet, Rfl: 0  EXAM:  Filed Vitals:   11/22/13 0843  BP: 122/80  Pulse: 110  Temp: 99.2 F (37.3 C)    Body mass index is 31.33 kg/(m^2).  GENERAL: vitals reviewed and listed above, alert, oriented, appears well hydrated and in no acute distress  HEENT: atraumatic, conjunttiva clear, no obvious abnormalities on inspection of external nose and ears  NECK: no obvious masses on inspection  LUNGS: clear to auscultation bilaterally, no wheezes, rales or rhonchi, good air movement  CV: HRRR, no peripheral edema  ABD: BS+, soft, NTTP, no CVA TTP  MS: moves all extremities without noticeable abnormality  PSYCH: pleasant and cooperative, no obvious depression or anxiety  ASSESSMENT AND PLAN:  Discussed the following assessment and plan:  Dysuria - Plan: POC Urinalysis Dipstick, Urine culture, ciprofloxacin (CIPRO) 500 MG tablet, CULTURE, URINE COMPREHENSIVE  -ua with bl, nit, leuks today -we discussed possible serious and likely etiologies, workup and treatment, treatment risks and return precautions [name] [name] opted for tx with cipro and cx pending - discussed common and serious side effects and potential interactions with his other medication and advised he notify his cardiologist and coumadin clinic of his illness and tx -advised needs to follow up with PCP if any further symptoms or if no bacteria on cx as may need urological evaluation -Patient advised to return or notify a doctor immediately if symptoms worsen or persist or new concerns arise.  Patient Instructions  -As we discussed, we have prescribed a new medication for you at this appointment. We discussed the common and serious potential adverse effects of  this medication and you can review these and more with the pharmacist when you pick up your medication.  Please follow the instructions for use carefully and notify us immediately if you have any problems taking this medication.  -We have ordered labs or studies at this visit. It can take up to 1-2 weeks for results and processing. We will contact you with instructions IF your results are abnormal. Normal results will be released to your [facility]. If you have not heard from [place] or can not find your results in [facility] in 2 weeks please contact our office.  -follow up with your doctor in the next 2-4 weeks to ensure  resolution or sooner if needed, seek care immediately if any significant bleeding or if worsening          [name] [name] R.

## 2013-11-22 NOTE — Progress Notes (Signed)
Pre visit review using our clinic review tool, if applicable. No additional management support is needed unless otherwise documented below in the visit note. 

## 2013-11-22 NOTE — Telephone Encounter (Signed)
I called the pt and he stated he wanted to have another day of Cipro to cover him on Monday while in [place] and I informed him per Dr Maudie Mercury he needs to take the Cipro for 3 days as this usually treats the infection and allows time for the culture to come back just in case he needs to be given another medication. I advised him I will call him Monday with results and he stated he will be in [place] and I advised him to call me on Monday and he agreed.

## 2013-11-26 DIAGNOSIS — Q268 Other congenital malformations of great veins: Secondary | ICD-10-CM | POA: Diagnosis not present

## 2013-11-26 DIAGNOSIS — I4891 Unspecified atrial fibrillation: Secondary | ICD-10-CM | POA: Diagnosis not present

## 2013-11-26 DIAGNOSIS — I712 Thoracic aortic aneurysm, without rupture, unspecified: Secondary | ICD-10-CM | POA: Diagnosis not present

## 2013-11-26 DIAGNOSIS — R9431 Abnormal electrocardiogram [ECG] [EKG]: Secondary | ICD-10-CM | POA: Diagnosis not present

## 2013-11-26 DIAGNOSIS — I714 Abdominal aortic aneurysm, without rupture, unspecified: Secondary | ICD-10-CM | POA: Diagnosis not present

## 2013-11-26 DIAGNOSIS — Z8673 Personal history of transient ischemic attack (TIA), and cerebral infarction without residual deficits: Secondary | ICD-10-CM | POA: Diagnosis not present

## 2013-11-26 DIAGNOSIS — I359 Nonrheumatic aortic valve disorder, unspecified: Secondary | ICD-10-CM | POA: Diagnosis not present

## 2013-11-26 DIAGNOSIS — R911 Solitary pulmonary nodule: Secondary | ICD-10-CM | POA: Diagnosis not present

## 2013-11-26 DIAGNOSIS — I4430 Unspecified atrioventricular block: Secondary | ICD-10-CM | POA: Diagnosis not present

## 2013-11-26 DIAGNOSIS — I5033 Acute on chronic diastolic (congestive) heart failure: Secondary | ICD-10-CM | POA: Diagnosis not present

## 2013-11-26 DIAGNOSIS — I4892 Unspecified atrial flutter: Secondary | ICD-10-CM | POA: Diagnosis not present

## 2013-11-26 DIAGNOSIS — E118 Type 2 diabetes mellitus with unspecified complications: Secondary | ICD-10-CM | POA: Diagnosis not present

## 2013-11-26 DIAGNOSIS — I050 Rheumatic mitral stenosis: Secondary | ICD-10-CM | POA: Diagnosis not present

## 2013-11-26 DIAGNOSIS — N390 Urinary tract infection, site not specified: Secondary | ICD-10-CM | POA: Diagnosis not present

## 2013-11-26 DIAGNOSIS — I059 Rheumatic mitral valve disease, unspecified: Secondary | ICD-10-CM | POA: Diagnosis not present

## 2013-11-27 ENCOUNTER — Telehealth: Payer: Self-pay | Admitting: *Deleted

## 2013-11-27 LAB — URINE CULTURE

## 2013-11-27 NOTE — Telephone Encounter (Signed)
Pt called and requesting notes & urine test to be sent to Dr. Karsten Ro. He has an appt tomorrow @ 12:00. Inform pt will fax over...Johny Chess

## 2013-11-28 ENCOUNTER — Telehealth: Payer: Self-pay | Admitting: Pharmacist

## 2013-11-28 DIAGNOSIS — N410 Acute prostatitis: Secondary | ICD-10-CM | POA: Diagnosis not present

## 2013-11-28 DIAGNOSIS — N452 Orchitis: Secondary | ICD-10-CM | POA: Diagnosis not present

## 2013-11-28 DIAGNOSIS — N390 Urinary tract infection, site not specified: Secondary | ICD-10-CM | POA: Diagnosis not present

## 2013-11-28 NOTE — Telephone Encounter (Signed)
Pt called to report his INR was 3.7 on 9/22.  He has been on Cipro for UTI which is likely the reason for elevation.  His ablation has been rescheduled to November.  Will have him hold Coumadin today then recheck INR on 9/30 as previously scheduled.

## 2013-12-02 ENCOUNTER — Telehealth: Payer: Self-pay | Admitting: Internal Medicine

## 2013-12-02 NOTE — Telephone Encounter (Signed)
Patient states urologist needs to know sensitivity on e coli for culture done at [facility] to be sent over to Urologist at [facility] (Dr. Kathaleen Grinder).  Patient states [facility] has called twice today.  Do not see any note in system of this.  Patient would like to know once this has happened.

## 2013-12-02 NOTE — Telephone Encounter (Signed)
Pt came in needing the abx sensitivities for the bacteria infection he had(s). I printed this information and handed to the pt.

## 2013-12-04 ENCOUNTER — Ambulatory Visit (INDEPENDENT_AMBULATORY_CARE_PROVIDER_SITE_OTHER): Payer: Medicare Other | Admitting: Pharmacist

## 2013-12-04 DIAGNOSIS — I6340 Cerebral infarction due to embolism of unspecified cerebral artery: Secondary | ICD-10-CM | POA: Diagnosis not present

## 2013-12-04 DIAGNOSIS — Z5181 Encounter for therapeutic drug level monitoring: Secondary | ICD-10-CM | POA: Diagnosis not present

## 2013-12-04 DIAGNOSIS — I4891 Unspecified atrial fibrillation: Secondary | ICD-10-CM | POA: Diagnosis not present

## 2013-12-04 LAB — POCT INR: INR: 3.1

## 2013-12-09 ENCOUNTER — Ambulatory Visit (INDEPENDENT_AMBULATORY_CARE_PROVIDER_SITE_OTHER): Payer: Medicare Other | Admitting: *Deleted

## 2013-12-09 DIAGNOSIS — I6340 Cerebral infarction due to embolism of unspecified cerebral artery: Secondary | ICD-10-CM

## 2013-12-09 DIAGNOSIS — Z5181 Encounter for therapeutic drug level monitoring: Secondary | ICD-10-CM | POA: Diagnosis not present

## 2013-12-09 DIAGNOSIS — I4891 Unspecified atrial fibrillation: Secondary | ICD-10-CM

## 2013-12-09 LAB — POCT INR: INR: 3.8

## 2013-12-11 ENCOUNTER — Telehealth: Payer: Self-pay | Admitting: Cardiology

## 2013-12-11 NOTE — Telephone Encounter (Signed)
Walk in Pt Form " sealed Envelope" Dropped off [name] [name] back on 10/8 will give to Her then   10.7.15/km

## 2013-12-12 ENCOUNTER — Encounter: Payer: Self-pay | Admitting: Cardiology

## 2013-12-12 ENCOUNTER — Ambulatory Visit (INDEPENDENT_AMBULATORY_CARE_PROVIDER_SITE_OTHER): Payer: Medicare Other | Admitting: Cardiology

## 2013-12-12 VITALS — BP 115/72 | HR 68 | Ht 68.0 in | Wt 195.6 lb

## 2013-12-12 DIAGNOSIS — I6340 Cerebral infarction due to embolism of unspecified cerebral artery: Secondary | ICD-10-CM

## 2013-12-12 DIAGNOSIS — Z9889 Other specified postprocedural states: Secondary | ICD-10-CM

## 2013-12-12 DIAGNOSIS — I4892 Unspecified atrial flutter: Secondary | ICD-10-CM

## 2013-12-12 MED ORDER — DRONEDARONE HCL 400 MG PO TABS: 400.0000 mg | ORAL_TABLET | Freq: Two times a day (BID) | ORAL | Status: DC

## 2013-12-12 NOTE — Patient Instructions (Signed)
Call when you get back from [place] so we can schedule an EKG. [phone]  You have an appointment with Dr [name] - December 4,2015 10:15PM

## 2013-12-13 ENCOUNTER — Ambulatory Visit: Payer: Medicare Other | Admitting: Internal Medicine

## 2013-12-13 NOTE — Progress Notes (Signed)
Patient ID: [name] [name], male   DOB: Feb 20, 1941, 73 y.o.   MRN: [id number] PCP: Dr. Maudie Mercury  73 yo with history of rheumatic fever and AI/[name] s/p bioprosthetic AVR and MVR at the [facility] as well as MAZE procedure all in 2006 presents for cardiology evaluation.  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  I started him on dronedarone and coumadin with plan for cardioversion after 4 wks of therapeutic anticoagulation if he did not convert back to NSR on his own.  He went back into NSR spontaneously and later stopped coumadin. Atrial flutter recurred in 3/15 and again resolved spontaneously.  In 6/15, patient was admitted with transient dysarthria (resolved in about 10 minutes).  However, he was found to have evidence for CVA by MRI.  Therefore, he was started on coumadin. He was noted to be in atypical atrial flutter again.  Ranolazine was added to dronedarone.    In 8/15, [name] [name] was seen in consultation at the [facility] clinic.  Atrial flutter ablation was planned in 9/15 but patient developed acute prostatitis requiring a prolonged antibiotic course so ablation was put off until 01/08/14.   Today, patient is back in NSR.  He thinks he has been in sinus rhythm for a week or 2.  He has had no further neurological symptoms.  No exertional dyspnea or chest pain.  He is working out at [employer].  No lightheadedness or syncope.  Weight is down 6 lbs.   I reviewed all his [facility] records today.   ECG (12/12/11): Atypical atrial flutter versus atrial tachycardia with rate around 100 bpm, QTc normal.  ECG (12/29/11): NSR, 1st degree AV block PR 242 msec.    ECG (12/13): NSR, 1st degree AV block PR 244 msec    ECG (10/14): NSR, 1st degree AV block ECG (3/15): Atypical atrial flutter with rate 87, inferior T wave inversions ECG (6/15): Atypical atrial flutter with rate 77 ECG (8/15): atypical atrial flutter rate 82 ECG (10/15): NSR, 1st degree AV  block  Labs (3/14): LDL 64, TSH normal, K 3.9, creatinine 1.0     Labs (6/15): LDL 54, HDL 48, creatinine 0.95                                                                                                                                   PMH: 1. Atrial fibrillation: s/p MAZE and LAA ligation in 2006.  No documented atrial fibrillation since operation.  2. Atypical atrial flutter: 10/13, 3/15, and again in 6/15.  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   - Echo 5/11 with EF 60-65%, normal bioprosthetic aortic valve, normal bioprosthetic mitral valve, moderate LAE.   - Echo 11/12 at [facility] looked ok per report.  - Echo (10/13) with EF 55-60%, mild LVH, normal bioprosthetic aortic and mitral  valves, moderate LAE, PA systolic pressure 35 mmHg.   - Echo (6/15) with EF 60%, moderate LVH, normal-appearing bioprosthetic MV and AoV, PA systolic pressure 32 mmHg.   - Echo 9/15 [facility]) with EF 55%, mild LVH, PASP 52 mmHg, severe LAE, bioprosthetic mitral valve with no [name] and mean gradient 11 mmHg, moderate TR, bioprosthetic aortic valve with no AI and mean gradient 9 mmHg, dilated ascending aorta to 5 cm.   4. Hyperlipidemia 5. L-spine surgery 6. LHC with no significant coronary disease in 2006 pre-op.  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  CTA chest 9/15 with dilated sinuses of valsalva to 5.0 cm and ascending aorta 4.9 cm.   SH: Married, retired Forensic psychologist [name] [name], [name] [name], [name] [name]).  2 sons.  Never smoked.   FH: No premature CAD.   ROS: All systems reviewed and negative except as per HPI.  Current Outpatient Prescriptions  Medication Sig Dispense Refill  . atorvastatin (LIPITOR) 80 MG tablet Take 1 tablet (80 mg total) by mouth daily.  90 tablet  3  . Bilberry 60 MG CAPS Take by mouth daily.      . Coenzyme  Q10-Vitamin E 100-300 MG-UNIT WAFR Take by mouth daily.      . Cyanocobalamin (VITAMIN B 12 PO) Take 1 tablet by mouth daily.      [name] dronedarone (MULTAQ) 400 MG tablet Take 1 tablet (400 mg total) by mouth 2 (two) times daily with a meal.  60 tablet  6  . fish oil-omega-3 fatty acids 1000 MG capsule Take 3 g by mouth daily.      . fluocinonide cream (LIDEX) 0.93 % Apply 1 application topically 2 (two) times daily as needed (for rash).      . hydrocortisone cream 1 % Apply 1 application topically 2 (two) times daily as needed for itching.      . loratadine (CLARITIN) 10 MG tablet Take 10 mg by mouth as needed for allergies.      . metoprolol succinate (TOPROL-XL) 50 MG 24 hr tablet Take 50 mg by mouth 2 (two) times daily at 8 am and 10 pm.      . multivitamin (THERAGRAN) per tablet Take 1 tablet by mouth daily.      . Probiotic Product (PROBIOTIC FORMULA PO) Take 2 capsules by mouth daily.       . ranolazine (RANEXA) 500 MG 12 hr tablet Take 1 tablet (500 mg total) by mouth 2 (two) times daily.  180 tablet  3  . saw palmetto 80 MG capsule Take 80 mg by mouth 2 (two) times daily.      . sildenafil (VIAGRA) 100 MG tablet Take 1 tablet (100 mg total) by mouth as needed.  10 tablet  6  . sulfamethoxazole-trimethoprim (BACTRIM DS) 800-160 MG per tablet       . warfarin (COUMADIN) 5 MG tablet 5-7.5 mg daily at 6 PM. Takes 7.5 mg on Monday and Friday and 5 mg all other days       No current facility-administered medications for this visit.    BP 115/72  Pulse 68  Ht 5\' 8"  (1.727 m)  Wt 195 lb 9.6 oz (88.724 kg)  BMI 29.75 kg/m2 General: NAD Neck: JVP 7 cm, no thyromegaly or thyroid nodule.  Lungs: Clear to auscultation bilaterally with normal respiratory effort. CV: Nondisplaced PMI.  Heart regular S1/S2, no S3/S4, 2/6 early SEM RUSB.  No peripheral edema.  No carotid bruit.  Normal pedal pulses.  Abdomen: Soft, nontender, no hepatosplenomegaly, no distention.  Neurologic: Alert and oriented x  3.  Psych: Normal affect. Extremities: No clubbing or cyanosis.   Assessment/Plan: 1. Atypical atrial flutter: Patient had a MAZE procedure with LAA ligation at the time of his valve replacement.  Since then, he has had occasional episodes of atypical atrial flutter. He had episodes in 10/13 and 3/15 that converted back to NSR on dronedarone. He was noted to be back in atrial flutter in 6/15 in the setting of a CVA, this had been persistent until the last few days when it appears he has gone back into NSR.  - Continue coumadin (not using NOAC due to valvular atrial fibrillation, do not have good data). This will need to be continued long term even with ablation given CVA.  - Continue current Toprol XL. - He will continue dronedarone and ranolazine (ranolazine used for antiarrhythmic properties) to maintain NSR.  Transitioning off dronedarone and ranolazine and onto dofetilide would be an option but would require hospitalization.  - Check INR weekly, will have atrial flutter ablation at [facility] in 11/15.  2. 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  Valves looked normal on 6/15 echo but most recent echo at [facility] in 9/15 showed an elevated mean gradient of 11 mmHg across the mitral valve (aortic valve still appeared relatively normal). There was no mitral regurgitation.  I will ask that a hard copy of the echo be sent here. He has no symptoms referrable to the mitral valve.  3. HTN: BP is reasonably controlled.  4. Hyperlipidemia: Good lipids in 6/15 on atorvastatin.   5. CVA: Suspect this was related to atrial flutter (despite LA appendage ligation).  He is now on coumadin. No residual neurologic deficits.  Continue long-term. 6. Thoracic aortic aneurysm: Dilated aortic root at sinuses of valsalva and ascending aorta to 5.0 cm.   - Continue Toprol XL given some evidence that beta blockers may attenuate thoracic aortic aneurysm growth.  - Repeat CTA  chest in 3/16 to follow thoracic aorta.   Loralie Champagne 12/13/2013

## 2013-12-16 ENCOUNTER — Ambulatory Visit (INDEPENDENT_AMBULATORY_CARE_PROVIDER_SITE_OTHER): Payer: Medicare Other | Admitting: *Deleted

## 2013-12-16 DIAGNOSIS — I4892 Unspecified atrial flutter: Secondary | ICD-10-CM | POA: Diagnosis not present

## 2013-12-16 DIAGNOSIS — I4891 Unspecified atrial fibrillation: Secondary | ICD-10-CM | POA: Diagnosis not present

## 2013-12-16 DIAGNOSIS — Z5181 Encounter for therapeutic drug level monitoring: Secondary | ICD-10-CM | POA: Diagnosis not present

## 2013-12-16 DIAGNOSIS — I6340 Cerebral infarction due to embolism of unspecified cerebral artery: Secondary | ICD-10-CM | POA: Diagnosis not present

## 2013-12-16 LAB — POCT INR: INR: 3.9

## 2013-12-19 ENCOUNTER — Other Ambulatory Visit (INDEPENDENT_AMBULATORY_CARE_PROVIDER_SITE_OTHER): Payer: Medicare Other

## 2013-12-19 ENCOUNTER — Encounter: Payer: Self-pay | Admitting: Internal Medicine

## 2013-12-19 ENCOUNTER — Ambulatory Visit (INDEPENDENT_AMBULATORY_CARE_PROVIDER_SITE_OTHER): Payer: Medicare Other | Admitting: Internal Medicine

## 2013-12-19 VITALS — BP 116/72 | HR 74 | Temp 98.3°F | Resp 16 | Ht 68.0 in | Wt 202.0 lb

## 2013-12-19 DIAGNOSIS — F528 Other sexual dysfunction not due to a substance or known physiological condition: Secondary | ICD-10-CM

## 2013-12-19 DIAGNOSIS — I6340 Cerebral infarction due to embolism of unspecified cerebral artery: Secondary | ICD-10-CM | POA: Diagnosis not present

## 2013-12-19 DIAGNOSIS — E785 Hyperlipidemia, unspecified: Secondary | ICD-10-CM

## 2013-12-19 DIAGNOSIS — R739 Hyperglycemia, unspecified: Secondary | ICD-10-CM | POA: Diagnosis not present

## 2013-12-19 DIAGNOSIS — R7303 Prediabetes: Secondary | ICD-10-CM | POA: Insufficient documentation

## 2013-12-19 DIAGNOSIS — Z0000 Encounter for general adult medical examination without abnormal findings: Secondary | ICD-10-CM

## 2013-12-19 DIAGNOSIS — Z23 Encounter for immunization: Secondary | ICD-10-CM | POA: Diagnosis not present

## 2013-12-19 DIAGNOSIS — Z86010 Personal history of colonic polyps: Secondary | ICD-10-CM | POA: Diagnosis not present

## 2013-12-19 DIAGNOSIS — N400 Enlarged prostate without lower urinary tract symptoms: Secondary | ICD-10-CM

## 2013-12-19 DIAGNOSIS — R972 Elevated prostate specific antigen [PSA]: Secondary | ICD-10-CM

## 2013-12-19 DIAGNOSIS — I483 Typical atrial flutter: Secondary | ICD-10-CM

## 2013-12-19 DIAGNOSIS — I10 Essential (primary) hypertension: Secondary | ICD-10-CM

## 2013-12-19 LAB — COMPREHENSIVE METABOLIC PANEL
ALBUMIN: 3.5 g/dL (ref 3.5–5.2)
ALT: 77
ALT: 64 U/L — ABNORMAL HIGH (ref 0–53)
AST: 63 U/L
AST: 45 U/L — ABNORMAL HIGH (ref 0–37)
Albumin: 3.6
Alkaline Phosphatase: 78 U/L
Alkaline Phosphatase: 57 U/L (ref 39–117)
BUN: 26 mg/dL — AB (ref 4–21)
BUN: 26 mg/dL — AB (ref 6–23)
CALCIUM: 9.2 mg/dL (ref 8.4–10.5)
CO2: 22 mmol/L
CO2: 25 mEq/L (ref 19–32)
CREATININE: 1.3 mg/dL (ref 0.4–1.5)
CREATININE: 1.34
Calcium: 8.5 mg/dL
Chloride: 104 mmol/L
Chloride: 103 mEq/L (ref 96–112)
GFR, Est African American: >60
GFR, Est Non African American: 53
GFR: 55.82 mL/min — AB (ref >60.00)
GLUCOSE: 105
GLUCOSE: 92 mg/dL (ref 70–99)
Potassium: 4.1 mmol/L
Potassium: 4.5 mEq/L (ref 3.5–5.1)
Protein: 7
Sodium: 141 mmol/L (ref 137–147)
Sodium: 137 mEq/L (ref 135–145)
Total Bilirubin: 0.7 mg/dL
Total Bilirubin: 0.7 mg/dL (ref 0.2–1.2)
Total Protein: 7.6 g/dL (ref 6.0–8.3)

## 2013-12-19 LAB — CBC WITH DIFFERENTIAL/PLATELET
BASOS PCT: 1.1 % (ref 0.0–3.0)
Basophils Absolute: 0.1 10*3/uL (ref 0.0–0.1)
EOS%: 1 %
Eosinophils Absolute: 0 /uL
Eosinophils Absolute: 0.2 10*3/uL (ref 0.0–0.7)
Eosinophils Relative: 2.6 % (ref 0.0–5.0)
HCT: 36 %
HCT: 38.2 % — ABNORMAL LOW (ref 39.0–52.0)
HEMOGLOBIN: 12.5 g/dL — AB (ref 13.0–17.0)
HGB: 12.1 g/dL
LYMPH%: 17 %
Lymphocytes Absolute: 2 /uL
Lymphocytes Relative: 27.2 % (ref 12.0–46.0)
Lymphs Abs: 2.2 10*3/uL (ref 0.7–4.0)
MCH: 30.7
MCHC: 33.8
MCHC: 32.8 g/dL (ref 30.0–36.0)
MCV: 90.9 fL
MCV: 93.3 fl (ref 78.0–100.0)
MONO%: 13 %
MONOS PCT: 9.4 % (ref 3.0–12.0)
MPV: 10.7 fL (ref 7.5–11.5)
Monocytes Absolute: 2 /uL
Monocytes Absolute: 0.8 10*3/uL (ref 0.1–1.0)
NEUT%: 68 %
NEUTROS ABS: 4.9 10*3/uL (ref 1.4–7.7)
NEUTROS ABS: 8.35
NEUTROS PCT: 59.7 % (ref 43.0–77.0)
Platelets: 242 10*3/uL
Platelets: 277 10*3/uL (ref 150.0–400.0)
RBC: 3.94
RBC: 4.09 Mil/uL — ABNORMAL LOW (ref 4.22–5.81)
RDW: 14.8
RDW: 14.7 % (ref 11.5–15.5)
WBC: 12.21
WBC: 8.1 10*3/uL (ref 4.0–10.5)

## 2013-12-19 LAB — TSH
A1c: 6.2
INR: 3.6
Magnesium: 2.2 mg/dL (ref 1.6–2.4)
PT: 39.6
TSH: 3.06

## 2013-12-19 LAB — LIPID PANEL
CHOLESTEROL, TOTAL: 102
HDL: 25 mg/dL — AB (ref 35–70)
LDL CALC: 58
Triglycerides: 93
VLDL: 19 mg/dL

## 2013-12-19 LAB — HEMOGLOBIN A1C: Hgb A1c MFr Bld: 6.1 % (ref 4.6–6.5)

## 2013-12-19 LAB — PSA: PSA: 10.3 ng/mL — ABNORMAL HIGH (ref 0.10–4.00)

## 2013-12-19 NOTE — Assessment & Plan Note (Signed)
I will check his PSA today to screen for cancer

## 2013-12-19 NOTE — Patient Instructions (Signed)
Hypertension Hypertension, commonly called high blood pressure, is when the force of blood pumping through your arteries is too strong. Your arteries are the blood vessels that carry blood from your heart throughout your body. A blood pressure reading consists of a higher number over a lower number, such as 110/72. The higher number (systolic) is the pressure inside your arteries when your heart pumps. The lower number (diastolic) is the pressure inside your arteries when your heart relaxes. Ideally you want your blood pressure below 120/80. Hypertension forces your heart to work harder to pump blood. Your arteries may become narrow or stiff. Having hypertension puts you at risk for heart disease, stroke, and other problems.  RISK FACTORS Some risk factors for high blood pressure are controllable. Others are not.  Risk factors you cannot control include:   Race. You may be at higher risk if you are African American.  Age. Risk increases with age.  Gender. Men are at higher risk than women before age 45 years. After age 65, women are at higher risk than men. Risk factors you can control include:  Not getting enough exercise or physical activity.  Being overweight.  Getting too much fat, sugar, calories, or salt in your diet.  Drinking too much alcohol. SIGNS AND SYMPTOMS Hypertension does not usually cause signs or symptoms. Extremely high blood pressure (hypertensive crisis) may cause headache, anxiety, shortness of breath, and nosebleed. DIAGNOSIS  To check if you have hypertension, your health care provider will measure your blood pressure while you are seated, with your arm held at the level of your heart. It should be measured at least twice using the same arm. Certain conditions can cause a difference in blood pressure between your right and left arms. A blood pressure reading that is higher than normal on one occasion does not mean that you need treatment. If one blood pressure reading  is high, ask your health care provider about having it checked again. TREATMENT  Treating high blood pressure includes making lifestyle changes and possibly taking medicine. Living a healthy lifestyle can help lower high blood pressure. You may need to change some of your habits. Lifestyle changes may include:  Following the DASH diet. This diet is high in fruits, vegetables, and whole grains. It is low in salt, red meat, and added sugars.  Getting at least 2 hours of brisk physical activity every week.  Losing weight if necessary.  Not smoking.  Limiting alcoholic beverages.  Learning ways to reduce stress. If lifestyle changes are not enough to get your blood pressure under control, your health care provider may prescribe medicine. You may need to take more than one. Work closely with your health care provider to understand the risks and benefits. HOME CARE INSTRUCTIONS  Have your blood pressure rechecked as directed by your health care provider.   Take medicines only as directed by your health care provider. Follow the directions carefully. Blood pressure medicines must be taken as prescribed. The medicine does not work as well when you skip doses. Skipping doses also puts you at risk for problems.   Do not smoke.   Monitor your blood pressure at home as directed by your health care provider. SEEK MEDICAL CARE IF:   You think you are having a reaction to medicines taken.  You have recurrent headaches or feel dizzy.  You have swelling in your ankles.  You have trouble with your vision. SEEK IMMEDIATE MEDICAL CARE IF:  You develop a severe headache or confusion.    You have unusual weakness, numbness, or feel faint.  You have severe chest or abdominal pain.  You vomit repeatedly.  You have trouble breathing. MAKE SURE YOU:   Understand these instructions.  Will watch your condition.  Will get help right away if you are not doing well or get worse. Document  Released: 02/21/2005 Document Revised: 07/08/2013 Document Reviewed: 12/14/2012 [employer] Patient Information 2015 [employer]. This information is not intended to replace advice given to you by your health care provider. Make sure you discuss any questions you have with your health care provider. Health Maintenance A healthy lifestyle and preventative care can promote health and wellness.  Maintain regular health, dental, and eye exams.  Eat a healthy diet. Foods like vegetables, fruits, whole grains, low-fat dairy products, and lean protein foods contain the nutrients you need and are low in calories. Decrease your intake of foods high in solid fats, added sugars, and salt. Get information about a proper diet from your health care provider, if necessary.  Regular physical exercise is one of the most important things you can do for your health. Most adults should get at least 150 minutes of moderate-intensity exercise (any activity that increases your heart rate and causes you to sweat) each week. In addition, most adults need muscle-strengthening exercises on 2 or more days a week.   Maintain a healthy weight. The body mass index (BMI) is a screening tool to identify possible weight problems. It provides an estimate of body fat based on height and weight. Your health care provider can find your BMI and can help you achieve or maintain a healthy weight. For males 20 years and older:  A BMI below 18.5 is considered underweight.  A BMI of 18.5 to 24.9 is normal.  A BMI of 25 to 29.9 is considered overweight.  A BMI of 30 and above is considered obese.  Maintain normal blood lipids and cholesterol by exercising and minimizing your intake of saturated fat. Eat a balanced diet with plenty of fruits and vegetables. Blood tests for lipids and cholesterol should begin at age 20 and be repeated every 5 years. If your lipid or cholesterol levels are high, you are over age 50, or you are at high risk  for heart disease, you may need your cholesterol levels checked more frequently.Ongoing high lipid and cholesterol levels should be treated with medicines if diet and exercise are not working.  If you smoke, find out from your health care provider how to quit. If you do not use tobacco, do not start.  Lung cancer screening is recommended for adults aged 55-80 years who are at high risk for developing lung cancer because of a history of smoking. A yearly low-dose CT scan of the lungs is recommended for people who have at least a 30-pack-year history of smoking and are current smokers or have quit within the past 15 years. A pack year of smoking is smoking an average of 1 pack of cigarettes a day for 1 year (for example, a 30-pack-year history of smoking could mean smoking 1 pack a day for 30 years or 2 packs a day for 15 years). Yearly screening should continue until the smoker has stopped smoking for at least 15 years. Yearly screening should be stopped for people who develop a health problem that would prevent them from having lung cancer treatment.  If you choose to drink alcohol, do not have more than 2 drinks per day. One drink is considered to be 12 oz (  360 mL) of beer, 5 oz (150 mL) of wine, or 1.5 oz (45 mL) of liquor.  Avoid the use of street drugs. Do not share needles with anyone. Ask for help if you need support or instructions about stopping the use of drugs.  High blood pressure causes heart disease and increases the risk of stroke. Blood pressure should be checked at least every 1-2 years. Ongoing high blood pressure should be treated with medicines if weight loss and exercise are not effective.  If you are 45-79 years old, ask your health care provider if you should take aspirin to prevent heart disease.  Diabetes screening involves taking a blood sample to check your fasting blood sugar level. This should be done once every 3 years after age 45 if you are at a normal weight and without  risk factors for diabetes. Testing should be considered at a younger age or be carried out more frequently if you are overweight and have at least 1 risk factor for diabetes.  Colorectal cancer can be detected and often prevented. Most routine colorectal cancer screening begins at the age of 50 and continues through age 75. However, your health care provider may recommend screening at an earlier age if you have risk factors for colon cancer. On a yearly basis, your health care provider may provide home test kits to check for hidden blood in the stool. A small camera at the end of a tube may be used to directly examine the colon (sigmoidoscopy or colonoscopy) to detect the earliest forms of colorectal cancer. Talk to your health care provider about this at age 50 when routine screening begins. A direct exam of the colon should be repeated every 5-10 years through age 75, unless early forms of precancerous polyps or small growths are found.  People who are at an increased risk for hepatitis B should be screened for this virus. You are considered at high risk for hepatitis B if:  You were born in a country where hepatitis B occurs often. Talk with your health care provider about which countries are considered high risk.  Your parents were born in a high-risk country and you have not received a shot to protect against hepatitis B (hepatitis B vaccine).  You have HIV or AIDS.  You use needles to inject street drugs.  You live with, or have sex with, someone who has hepatitis B.  You are a man who has sex with other men (MSM).  You get hemodialysis treatment.  You take certain medicines for conditions like cancer, organ transplantation, and autoimmune conditions.  Hepatitis C blood testing is recommended for all people born from 1945 through 1965 and any individual with known risk factors for hepatitis C.  Healthy men should no longer receive prostate-specific antigen (PSA) blood tests as part of  routine cancer screening. Talk to your health care provider about prostate cancer screening.  Testicular cancer screening is not recommended for adolescents or adult males who have no symptoms. Screening includes self-exam, a health care provider exam, and other screening tests. Consult with your health care provider about any symptoms you have or any concerns you have about testicular cancer.  Practice safe sex. Use condoms and avoid high-risk sexual practices to reduce the spread of sexually transmitted infections (STIs).  You should be screened for STIs, including gonorrhea and chlamydia if:  You are sexually active and are younger than 24 years.  You are older than 24 years, and your health care provider tells you   that you are at risk for this type of infection.  Your sexual activity has changed since you were last screened, and you are at an increased risk for chlamydia or gonorrhea. Ask your health care provider if you are at risk.  If you are at risk of being infected with HIV, it is recommended that you take a prescription medicine daily to prevent HIV infection. This is called pre-exposure prophylaxis (PrEP). You are considered at risk if:  You are a man who has sex with other men (MSM).  You are a heterosexual man who is sexually active with multiple partners.  You take drugs by injection.  You are sexually active with a partner who has HIV.  Talk with your health care provider about whether you are at high risk of being infected with HIV. If you choose to begin PrEP, you should first be tested for HIV. You should then be tested every 3 months for as long as you are taking PrEP.  Use sunscreen. Apply sunscreen liberally and repeatedly throughout the day. You should seek shade when your shadow is shorter than you. Protect yourself by wearing long sleeves, pants, a wide-brimmed hat, and sunglasses year round whenever you are outdoors.  Tell your health care provider of new moles  or changes in moles, especially if there is a change in shape or color. Also, tell your health care provider if a mole is larger than the size of a pencil eraser.  A one-time screening for abdominal aortic aneurysm (AAA) and surgical repair of large AAAs by ultrasound is recommended for men aged 65-75 years who are current or former smokers.  Stay current with your vaccines (immunizations). Document Released: 08/20/2007 Document Revised: 02/26/2013 Document Reviewed: 07/19/2010 [employer] Patient Information 2015 [employer]. This information is not intended to replace advice given to you by your health care provider. Make sure you discuss any questions you have with your health care provider.  

## 2013-12-19 NOTE — Progress Notes (Signed)
Pre visit review using our clinic review tool, if applicable. No additional management support is needed unless otherwise documented below in the visit note. 

## 2013-12-23 ENCOUNTER — Ambulatory Visit (INDEPENDENT_AMBULATORY_CARE_PROVIDER_SITE_OTHER): Payer: Medicare Other

## 2013-12-23 DIAGNOSIS — Z5181 Encounter for therapeutic drug level monitoring: Secondary | ICD-10-CM

## 2013-12-23 DIAGNOSIS — R972 Elevated prostate specific antigen [PSA]: Secondary | ICD-10-CM | POA: Insufficient documentation

## 2013-12-23 DIAGNOSIS — I4891 Unspecified atrial fibrillation: Secondary | ICD-10-CM

## 2013-12-23 DIAGNOSIS — I4892 Unspecified atrial flutter: Secondary | ICD-10-CM | POA: Diagnosis not present

## 2013-12-23 DIAGNOSIS — I6340 Cerebral infarction due to embolism of unspecified cerebral artery: Secondary | ICD-10-CM | POA: Diagnosis not present

## 2013-12-23 LAB — POCT INR: INR: 2.1

## 2013-12-23 NOTE — Progress Notes (Signed)
Subjective:    Patient ID: [name] [name], male    DOB: 03/12/1940, 73 y.o.   MRN: [id number]  Hypertension This is a chronic problem. The current episode started more than 1 year ago. The problem has been gradually improving since onset. The problem is controlled. Pertinent negatives include no anxiety, blurred vision, chest pain, headaches, malaise/fatigue, neck pain, orthopnea, palpitations, peripheral edema, PND, shortness of breath or sweats. There are no associated agents to hypertension. Past treatments include beta blockers. There are no compliance problems.  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      Review of Systems  Constitutional: Negative.  Negative for fever, chills, malaise/fatigue, diaphoresis and fatigue.  HENT: Negative.   Eyes: Negative.  Negative for blurred vision.  Respiratory: Negative.  Negative for cough, choking, chest tightness, shortness of breath and stridor.   Cardiovascular: Negative.  Negative for chest pain, palpitations, orthopnea, leg swelling and PND.  Gastrointestinal: Negative.  Negative for nausea, vomiting, abdominal pain, diarrhea, constipation and blood in stool.  Endocrine: Negative.   Genitourinary: Negative.  Negative for difficulty urinating.  Musculoskeletal: Negative.  Negative for arthralgias, back pain, myalgias, neck pain and neck stiffness.  Skin: Negative.  Negative for rash.  Allergic/Immunologic: Negative.   Neurological: Negative.  Negative for tremors, syncope, weakness, light-headedness and headaches.  Hematological: Negative.  Negative for adenopathy. Does not bruise/bleed easily.  Psychiatric/Behavioral: Negative.        Objective:   Physical Exam  Vitals reviewed. Constitutional: He is oriented to person, place, and time. He appears well-developed and well-nourished. No distress.  HENT:  Head: Normocephalic and atraumatic.  Mouth/Throat: Oropharynx is clear and moist. No  oropharyngeal exudate.  Eyes: Conjunctivae are normal. Right eye exhibits no discharge. Left eye exhibits no discharge. No scleral icterus.  Neck: Normal range of motion. Neck supple. No JVD present. No tracheal deviation present. No thyromegaly present.  Cardiovascular: Normal rate, regular rhythm, S1 normal, S2 normal and intact distal pulses.  Exam reveals no gallop, no S3, no S4 and no friction rub.   Murmur heard.  Systolic murmur is present with a grade of 1/6   Diastolic murmur is present with a grade of 1/6  Pulmonary/Chest: Effort normal and breath sounds normal. No stridor. No respiratory distress. He has no wheezes. He has no rales. He exhibits no tenderness.  Abdominal: Soft. Bowel sounds are normal. He exhibits no distension and no mass. There is no tenderness. There is no rebound and no guarding.  Musculoskeletal: Normal range of motion. He exhibits no edema and no tenderness.  Lymphadenopathy:    He has no cervical adenopathy.  Neurological: He is oriented to person, place, and time.  Skin: Skin is warm and dry. No rash noted. He is not diaphoretic. No erythema. No pallor.  Psychiatric: He has a normal mood and affect. His behavior is normal. Judgment and thought content normal.     Lab Results  Component Value Date   WBC 8.1 12/19/2013   HGB 12.5* 12/19/2013   HCT 38.2* 12/19/2013   PLT 277.0 12/19/2013   GLUCOSE 92 12/19/2013   CHOL 114 08/06/2013   TRIG 93 11/26/2013   HDL 25* 11/26/2013   LDLCALC 58 11/26/2013   ALT 64* 12/19/2013   AST 45* 12/19/2013   NA 137 12/19/2013   K 4.5 12/19/2013   CL 103 12/19/2013   CREATININE 1.3 12/19/2013   BUN 26* 12/19/2013   CO2 25 12/19/2013   TSH 3.060 11/26/2013  PSA 10.30* 12/19/2013   INR 3.9 12/16/2013   HGBA1C 6.1 12/19/2013       Assessment & Plan:

## 2013-12-23 NOTE — Assessment & Plan Note (Signed)
PSA elevation is most likely caused by the acute prostatitis Will cont Bactrim and recheck PSA in about 3 months

## 2013-12-23 NOTE — Assessment & Plan Note (Signed)
His BP is well controlled Lytes and renal function are stable 

## 2013-12-23 NOTE — Assessment & Plan Note (Signed)
He has good rate and rhythm control 

## 2013-12-23 NOTE — Assessment & Plan Note (Addendum)

## 2013-12-30 ENCOUNTER — Ambulatory Visit (INDEPENDENT_AMBULATORY_CARE_PROVIDER_SITE_OTHER): Payer: Medicare Other | Admitting: Pharmacist

## 2013-12-30 DIAGNOSIS — Z5181 Encounter for therapeutic drug level monitoring: Secondary | ICD-10-CM

## 2013-12-30 DIAGNOSIS — I4891 Unspecified atrial fibrillation: Secondary | ICD-10-CM

## 2013-12-30 DIAGNOSIS — I6340 Cerebral infarction due to embolism of unspecified cerebral artery: Secondary | ICD-10-CM | POA: Diagnosis not present

## 2013-12-30 DIAGNOSIS — I4892 Unspecified atrial flutter: Secondary | ICD-10-CM

## 2013-12-30 LAB — POCT INR: INR: 3.6

## 2013-12-31 DIAGNOSIS — N410 Acute prostatitis: Secondary | ICD-10-CM | POA: Diagnosis not present

## 2013-12-31 DIAGNOSIS — N390 Urinary tract infection, site not specified: Secondary | ICD-10-CM | POA: Diagnosis not present

## 2013-12-31 DIAGNOSIS — R972 Elevated prostate specific antigen [PSA]: Secondary | ICD-10-CM | POA: Diagnosis not present

## 2013-12-31 DIAGNOSIS — N453 Epididymo-orchitis: Secondary | ICD-10-CM | POA: Diagnosis not present

## 2014-01-06 DIAGNOSIS — I4891 Unspecified atrial fibrillation: Secondary | ICD-10-CM | POA: Diagnosis not present

## 2014-01-06 DIAGNOSIS — I359 Nonrheumatic aortic valve disorder, unspecified: Secondary | ICD-10-CM | POA: Diagnosis not present

## 2014-01-06 DIAGNOSIS — I77819 Aortic ectasia, unspecified site: Secondary | ICD-10-CM | POA: Diagnosis not present

## 2014-01-06 DIAGNOSIS — I499 Cardiac arrhythmia, unspecified: Secondary | ICD-10-CM | POA: Diagnosis not present

## 2014-01-06 DIAGNOSIS — Q268 Other congenital malformations of great veins: Secondary | ICD-10-CM | POA: Diagnosis not present

## 2014-01-07 DIAGNOSIS — I440 Atrioventricular block, first degree: Secondary | ICD-10-CM | POA: Diagnosis not present

## 2014-01-07 DIAGNOSIS — I061 Rheumatic aortic insufficiency: Secondary | ICD-10-CM | POA: Diagnosis not present

## 2014-01-07 DIAGNOSIS — I080 Rheumatic disorders of both mitral and aortic valves: Secondary | ICD-10-CM | POA: Diagnosis not present

## 2014-01-07 DIAGNOSIS — I484 Atypical atrial flutter: Secondary | ICD-10-CM | POA: Diagnosis not present

## 2014-01-07 DIAGNOSIS — I052 Rheumatic mitral stenosis with insufficiency: Secondary | ICD-10-CM | POA: Diagnosis not present

## 2014-01-07 DIAGNOSIS — I481 Persistent atrial fibrillation: Secondary | ICD-10-CM | POA: Diagnosis not present

## 2014-01-07 DIAGNOSIS — Z8673 Personal history of transient ischemic attack (TIA), and cerebral infarction without residual deficits: Secondary | ICD-10-CM | POA: Diagnosis not present

## 2014-01-07 DIAGNOSIS — R9431 Abnormal electrocardiogram [ECG] [EKG]: Secondary | ICD-10-CM | POA: Diagnosis not present

## 2014-01-07 DIAGNOSIS — Z952 Presence of prosthetic heart valve: Secondary | ICD-10-CM | POA: Diagnosis not present

## 2014-01-08 DIAGNOSIS — I484 Atypical atrial flutter: Secondary | ICD-10-CM | POA: Diagnosis not present

## 2014-01-08 DIAGNOSIS — E785 Hyperlipidemia, unspecified: Secondary | ICD-10-CM | POA: Diagnosis not present

## 2014-01-08 DIAGNOSIS — R9431 Abnormal electrocardiogram [ECG] [EKG]: Secondary | ICD-10-CM | POA: Diagnosis not present

## 2014-01-08 DIAGNOSIS — Z8673 Personal history of transient ischemic attack (TIA), and cerebral infarction without residual deficits: Secondary | ICD-10-CM | POA: Diagnosis not present

## 2014-01-08 DIAGNOSIS — Z7901 Long term (current) use of anticoagulants: Secondary | ICD-10-CM | POA: Diagnosis not present

## 2014-01-08 DIAGNOSIS — I491 Atrial premature depolarization: Secondary | ICD-10-CM | POA: Diagnosis not present

## 2014-01-08 DIAGNOSIS — I480 Paroxysmal atrial fibrillation: Secondary | ICD-10-CM | POA: Diagnosis not present

## 2014-01-08 DIAGNOSIS — I059 Rheumatic mitral valve disease, unspecified: Secondary | ICD-10-CM | POA: Diagnosis not present

## 2014-01-08 DIAGNOSIS — I493 Ventricular premature depolarization: Secondary | ICD-10-CM | POA: Diagnosis not present

## 2014-01-08 DIAGNOSIS — I719 Aortic aneurysm of unspecified site, without rupture: Secondary | ICD-10-CM | POA: Diagnosis not present

## 2014-01-08 DIAGNOSIS — I440 Atrioventricular block, first degree: Secondary | ICD-10-CM | POA: Diagnosis not present

## 2014-01-08 DIAGNOSIS — I359 Nonrheumatic aortic valve disorder, unspecified: Secondary | ICD-10-CM | POA: Diagnosis not present

## 2014-01-08 DIAGNOSIS — I10 Essential (primary) hypertension: Secondary | ICD-10-CM | POA: Diagnosis not present

## 2014-01-08 DIAGNOSIS — I4891 Unspecified atrial fibrillation: Secondary | ICD-10-CM | POA: Diagnosis not present

## 2014-01-08 DIAGNOSIS — Z952 Presence of prosthetic heart valve: Secondary | ICD-10-CM | POA: Diagnosis not present

## 2014-01-09 DIAGNOSIS — Z952 Presence of prosthetic heart valve: Secondary | ICD-10-CM | POA: Diagnosis not present

## 2014-01-09 DIAGNOSIS — I719 Aortic aneurysm of unspecified site, without rupture: Secondary | ICD-10-CM | POA: Diagnosis not present

## 2014-01-09 DIAGNOSIS — I10 Essential (primary) hypertension: Secondary | ICD-10-CM | POA: Diagnosis not present

## 2014-01-09 DIAGNOSIS — I484 Atypical atrial flutter: Secondary | ICD-10-CM | POA: Diagnosis not present

## 2014-01-09 DIAGNOSIS — I4891 Unspecified atrial fibrillation: Secondary | ICD-10-CM | POA: Diagnosis not present

## 2014-01-09 DIAGNOSIS — E785 Hyperlipidemia, unspecified: Secondary | ICD-10-CM | POA: Diagnosis not present

## 2014-01-09 DIAGNOSIS — I480 Paroxysmal atrial fibrillation: Secondary | ICD-10-CM | POA: Diagnosis not present

## 2014-01-14 ENCOUNTER — Other Ambulatory Visit: Payer: Self-pay | Admitting: Cardiology

## 2014-01-16 ENCOUNTER — Ambulatory Visit (INDEPENDENT_AMBULATORY_CARE_PROVIDER_SITE_OTHER): Payer: Medicare Other | Admitting: *Deleted

## 2014-01-16 DIAGNOSIS — I6340 Cerebral infarction due to embolism of unspecified cerebral artery: Secondary | ICD-10-CM

## 2014-01-16 DIAGNOSIS — I4891 Unspecified atrial fibrillation: Secondary | ICD-10-CM | POA: Diagnosis not present

## 2014-01-16 DIAGNOSIS — Z5181 Encounter for therapeutic drug level monitoring: Secondary | ICD-10-CM

## 2014-01-16 DIAGNOSIS — I4892 Unspecified atrial flutter: Secondary | ICD-10-CM | POA: Diagnosis not present

## 2014-01-16 LAB — POCT INR: INR: 2.2

## 2014-01-20 ENCOUNTER — Encounter: Payer: Self-pay | Admitting: Neurology

## 2014-01-20 ENCOUNTER — Ambulatory Visit (INDEPENDENT_AMBULATORY_CARE_PROVIDER_SITE_OTHER): Payer: Medicare Other | Admitting: Neurology

## 2014-01-20 VITALS — BP 140/82 | HR 75 | Ht 68.0 in | Wt 209.2 lb

## 2014-01-20 DIAGNOSIS — I10 Essential (primary) hypertension: Secondary | ICD-10-CM

## 2014-01-20 DIAGNOSIS — I482 Chronic atrial fibrillation, unspecified: Secondary | ICD-10-CM

## 2014-01-20 DIAGNOSIS — I6340 Cerebral infarction due to embolism of unspecified cerebral artery: Secondary | ICD-10-CM

## 2014-01-20 NOTE — Patient Instructions (Addendum)
-   continue coumadin and lipitor for stroke prevention - monitor INR goal 2-3, and bleeding precautions. - even you had ablation procedure, you still need long term coumadin use - check BP at home, avoid hypertension.  - follow up with cardiologist as scheduled - Follow up with your primary care physician for stroke risk factor modification. Recommend maintain blood pressure goal <130/80, diabetes with hemoglobin A1c goal below 6.5% and lipids with LDL cholesterol goal below 70 mg/dL.  - follow up in 6 months.

## 2014-01-20 NOTE — Progress Notes (Signed)
STROKE NEUROLOGY FOLLOW UP NOTE  NAME: [name] [name] DOB: Aug 16, 1940  REASON FOR VISIT: stroke follow up HISTORY FROM: pt and chart  Today we had the pleasure of seeing [name] [name] in follow-up at our Neurology Clinic. Pt was accompanied by no one.   History Summary [name] [name] is a 73 y.o. 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 He was off coumadin for a while as per cardiology before the admission on 08/05/13 due to acute onset expressive aphasia, right facial droop and right-sided numbness. This was completely resolved before ER arrival. Patient was not administered TPA secondary to symptoms resolved. In ER, he still showing in A flutter rhythm. After admission, MRI showed acute left frontal stroke but also with subacute left parietal infarct, consistent with embolic stroke. Cardiology was consulted and agreed with anticoagulation with coumadin. He was discharged with coumadin.   Follow up 10/14/13 - the patient has been doing well. He stated that he had cardioversion with [facility] 3 weeks ago and for a week his heart rhythm is in sinus but for the last two weeks he was still in Afib or Aflutter. He is going to [facility] this week to see if he is a candidate for ablation to treat his heart rhythm but he is aware that he needs to take anticoagulation likely for life. He followed up with INR check and his INR was in good range between 2-3.  Interval History During the interval time, pt was doing well. He had ablation procedure done in [facility] last in September. It was successful and he did not have any symptoms of palpitation or CP. His INR check 4 days ago was 2.2. His is compliant with coumadin. BP stable and today 140/82. He said his BP at home around 120s. He has appointment follow up with his  cardiologist Dr. [name] next month and also with his cardiologist in [place] [facility].   REVIEW OF SYSTEMS: Full 14 system review of systems performed and notable only for those listed below and in HPI above, all others are negative:  Constitutional: N/A  Cardiovascular: palpitation  Ear/Nose/Throat: N/A  Skin: N/A  Eyes: N/A  Respiratory: N/A  Gastroitestinal: N/A  Genitourinary: N/A Hematology/Lymphatic: N/A  Endocrine: N/A  Musculoskeletal: N/A  Allergy/Immunology: N/A  Neurological: N/A  Psychiatric: N/A  The following represents the patient's updated allergies and side effects list: Allergies  Allergen Reactions  . Naproxen     REACTION: Hives    Labs since last visit of relevance include the following: Results for orders placed or performed in visit on 01/16/14  POCT INR  Result Value Ref Range   INR 2.2     The neurologically relevant items on the patient's problem list were reviewed on today's visit.  Neurologic Examination  A problem focused neurological exam (12 or more points of the single system neurologic examination, vital signs counts as 1 point, cranial nerves count for 8 points) was performed.  Blood pressure 140/82, pulse 75, height 5\' 8"  (1.727 m), weight 209 lb 3.2 oz (94.892 kg).  General - Well nourished, well developed, in no apparent distress.  Ophthalmologic - Sharp disc margins OU.  Cardiovascular - regular heart rhythm.  Mental Status -  Level of arousal and orientation to time, place, and person were intact. Language including expression, naming, repetition, comprehension, reading,  and writing was assessed and found intact.  Cranial Nerves II - XII -  II - Vision intact OU. III, IV, VI - Extraocular movements intact. V - Facial sensation intact bilaterally. VII - Facial movement intact bilaterally. VIII - Hearing & vestibular intact bilaterally. X - Palate elevates symmetrically. XI - Chin turning & shoulder shrug intact  bilaterally. XII - Tongue protrusion intact.  Motor Strength - The patient's strength was normal in all extremities and pronator drift was absent.  Bulk was normal and fasciculations were absent.   Motor Tone - Muscle tone was assessed at the neck and appendages and was normal.  Reflexes - The patient's reflexes were normal in all extremities and he had no pathological reflexes.  Sensory - Light touch, temperature/pinprick, vibration and proprioception, and Romberg testing were assessed and were normal.    Coordination - The patient had normal movements in the hands and feet with no ataxia or dysmetria.  Tremor was absent.  Gait and Station - The patient's transfers, posture, gait, station, and turns were observed as normal.  Data reviewed: I personally reviewed the images and agree with the radiology interpretations.  CT of the brain 08/05/2013 1. 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 There is no evidence of an acute infarct. 2. Mild atrophy and chronic microvascular ischemic change. 3. No other abnormalities.  MRI of the brain 08/06/2013 Scattered small acute nonhemorrhagic left frontal lobe infarct. Subacute left parietal lobe infarct with laminar necrosis (less likely gyriform petechial hemorrhage). Tiny punctate region of blood breakdown products posterior left temporal lobe may reflect result of hemorrhage ischemia without intracranial hemorrhage otherwise noted. Mild to moderate small vessel disease type changes. Mild gyriform enhancement of the subacute left parietal lobe infarct otherwise no evidence of intracranial enhancing lesion or mass identified. Mild mucosal thickening inferior aspect right maxillary sinus. Transverse ligament hypertrophy with minimal anterior slippage of the C1 ring and mild posterior displacement of the dens contributing to moderate spinal stenosis and cord flattening greater on the left.  MRA of the brain 08/06/2013 Small caliber A2 segment of the  left anterior cerebral artery with majority of the A2 segment supplied from the right. Anterior circulation without medium or large size vessel significant stenosis or occlusion. Mild middle cerebral artery branch vessel irregularity greater on the left. Right posterior inferior cerebellar artery and anterior inferior cerebellar artery have a single origin from the proximal basilar artery. Similar appearance on the left slightly distal to this level. No significant stenosis of the distal vertebral arteries or basilar artery. Minimal irregularity distal posterior cerebral artery branch vessels.  MRA of the neck 08/06/2013 Ectatic 3 vessel aortic arch. Ectasia great vessels. Mild narrowing proximal right subclavian artery. Minimal narrowing proximal right vertebral artery. Mild ectasia proximal left vertebral artery. No significant stenosis of the vertebral arteries. No significant stenosis of either carotid artery. Prominent and ectasia of the vertical segment of the internal carotid arteries bilaterally.  2D Echocardiogram EF 55-60% with no source of embolus.  Carotid Doppler No evidence of hemodynamically significant internal carotid artery stenosis. Vertebral artery flow is antegrade.  CXR 08/06/2013 No acute cardiopulmonary disease.   Assessment: As you may recall, he is a 73 y.o. Caucasian male with a diagnosis of stroke followed up in clinic today. He presented to [facility] on 08/05/13 due to aphasia, right facial droop and right arm numbness. MRI showed acute left frontal stroke with subacute left parietal stroke, consistent with embolic stroke. Given his hx of rheumatic fever with  mitral and aortic valve abnormality s/p valvular replacement and on afib with discontinued coumadin by cardiology, his stroke was high likely due to afib not on anticoagulation. Discussed with cardiology, he was put on coumadin. After discharge, he has been follow up with coumadin clinic and INR is on the goal between 2-3. He had  cardioversion 3 weeks ago but currently he still in afib. He had ablation procedure in September and now he is in NSR. He continued on coumadin and INR last check 2.2.    Plan:  - continue coumadin and and lipitor for stroke prevention - goal INR 2-3.  - follow up with cardiologist as scheduled. - Follow up with your primary care physician for stroke risk factor modification. Recommend maintain blood pressure goal <130/80, diabetes with hemoglobin A1c goal below 6.5% and lipids with LDL cholesterol goal below 70 mg/dL.  - check BP at home and avoid high BP - RTC in 6 months.  Diagnoses from this visit: No diagnosis found.   Patient Instructions  - continue coumadin and lipitor for stroke prevention - monitor INR goal 2-3, and bleeding precautions. - even you had ablation procedure, you still need long term coumadin use - check BP at home, avoid hypertension.  - follow up with cardiologist as scheduled - Follow up with your primary care physician for stroke risk factor modification. Recommend maintain blood pressure goal <130/80, diabetes with hemoglobin A1c goal below 6.5% and lipids with LDL cholesterol goal below 70 mg/dL.  - follow up in 6 months.   Rosalin Hawking, MD PhD [facility] [address], [place], [place] [address] [phone]

## 2014-02-07 ENCOUNTER — Ambulatory Visit (INDEPENDENT_AMBULATORY_CARE_PROVIDER_SITE_OTHER): Payer: Medicare Other | Admitting: *Deleted

## 2014-02-07 ENCOUNTER — Encounter: Payer: Self-pay | Admitting: Cardiology

## 2014-02-07 ENCOUNTER — Ambulatory Visit (INDEPENDENT_AMBULATORY_CARE_PROVIDER_SITE_OTHER): Payer: Medicare Other | Admitting: Cardiology

## 2014-02-07 VITALS — BP 130/70 | HR 60 | Ht 68.0 in | Wt 205.8 lb

## 2014-02-07 DIAGNOSIS — I6340 Cerebral infarction due to embolism of unspecified cerebral artery: Secondary | ICD-10-CM

## 2014-02-07 DIAGNOSIS — E785 Hyperlipidemia, unspecified: Secondary | ICD-10-CM

## 2014-02-07 DIAGNOSIS — I4891 Unspecified atrial fibrillation: Secondary | ICD-10-CM | POA: Diagnosis not present

## 2014-02-07 DIAGNOSIS — I10 Essential (primary) hypertension: Secondary | ICD-10-CM | POA: Diagnosis not present

## 2014-02-07 DIAGNOSIS — I4892 Unspecified atrial flutter: Secondary | ICD-10-CM

## 2014-02-07 DIAGNOSIS — Z5181 Encounter for therapeutic drug level monitoring: Secondary | ICD-10-CM | POA: Diagnosis not present

## 2014-02-07 LAB — POCT INR: INR: 2.8

## 2014-02-07 NOTE — Patient Instructions (Signed)
Your physician recommends that you have lab work today --liver profile.  Your physician wants you to follow-up in: 5 months (May 2016) with Dr [name]. You will receive a reminder letter in the mail two months in advance. If you don't receive a letter, please call our office to schedule the follow-up appointment.

## 2014-02-09 DIAGNOSIS — I4891 Unspecified atrial fibrillation: Secondary | ICD-10-CM | POA: Diagnosis not present

## 2014-02-09 LAB — HEPATIC FUNCTION PANEL
ALK PHOS: 54 U/L (ref 39–117)
ALT: 45 U/L (ref 0–53)
AST: 35 U/L (ref 0–37)
Albumin: 4.2 g/dL (ref 3.5–5.2)
BILIRUBIN DIRECT: 0.1 mg/dL (ref 0.0–0.3)
BILIRUBIN TOTAL: 0.8 mg/dL (ref 0.2–1.2)
Total Protein: 7.2 g/dL (ref 6.0–8.3)

## 2014-02-09 NOTE — Progress Notes (Signed)
Patient ID: [name] [name], male   DOB: 1940-12-26, 73 y.o.   MRN: [id number] PCP: Dr. Maudie Mercury  73 yo with history of rheumatic fever and AI/[name] s/p bioprosthetic AVR and MVR at the [facility] as well as MAZE procedure all in 2006 presents for cardiology evaluation.  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  I started him on dronedarone and coumadin with plan for cardioversion after 4 wks of therapeutic anticoagulation if he did not convert back to NSR on his own.  He went back into NSR spontaneously and later stopped coumadin. Atrial flutter recurred in 3/15 and again resolved spontaneously.  In 6/15, patient was admitted with transient dysarthria (resolved in about 10 minutes).  However, he was found to have evidence for CVA by MRI.  Therefore, he was started on coumadin. He was noted to be in atypical atrial flutter again.  Ranolazine was added to dronedarone.    In 8/15, [name] [name] was seen in consultation at the [facility] clinic.  He had atrial flutter ablation at [facility] in 11/15.    Today, patient remains in NSR.  He has had no further neurological symptoms.  No exertional dyspnea or chest pain.  He is working out at [employer].  No lightheadedness or syncope.  Transaminases have been mildly elevated.   ECG (12/12/11): Atypical atrial flutter versus atrial tachycardia with rate around 100 bpm, QTc normal.  ECG (12/29/11): NSR, 1st degree AV block PR 242 msec.    ECG (12/13): NSR, 1st degree AV block PR 244 msec    ECG (10/14): NSR, 1st degree AV block ECG (3/15): Atypical atrial flutter with rate 87, inferior T wave inversions ECG (6/15): Atypical atrial flutter with rate 77 ECG (8/15): atypical atrial flutter rate 82 ECG (10/15): NSR, 1st degree AV block ECG (12/15): NSR, 1st degree AV block, QTc 410 msec  Labs (3/14): LDL 64, TSH normal, K 3.9, creatinine 1.0     Labs (6/15): LDL 54, HDL 48, creatinine 0.95   Labs (9/15): LDL 54, HDL  25 Labs (10/15): K 4.5, creatinine 1.3, AST 45, ALT 64                                                                                                                                 PMH: 1. Atrial fibrillation: s/p MAZE and LAA ligation in 2006.  No documented atrial fibrillation since operation.  2. Atypical atrial flutter: 10/13, 3/15, and again in 6/15.  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   - Echo 5/11 with EF 60-65%, normal bioprosthetic aortic valve, normal bioprosthetic mitral valve, moderate LAE.   - Echo 11/12 at [facility] looked ok per report.  - Echo (10/13) with EF 55-60%, mild LVH, normal bioprosthetic aortic and mitral valves, moderate LAE, PA systolic pressure 35 mmHg.   - Echo (  6/15) with EF 60%, moderate LVH, normal-appearing bioprosthetic MV and AoV, PA systolic pressure 32 mmHg.   - Echo 9/15 [facility]) with EF 55%, mild LVH, PASP 52 mmHg, severe LAE, bioprosthetic mitral valve with no [name] and mean gradient 11 mmHg, moderate TR, bioprosthetic aortic valve with no AI and mean gradient 9 mmHg, dilated ascending aorta to 5 cm.   4. Hyperlipidemia 5. L-spine surgery 6. LHC with no significant coronary disease in 2006 pre-op.  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  CTA chest 9/15 with dilated sinuses of valsalva to 5.0 cm and ascending aorta 4.9 cm.   SH: Married, retired Forensic psychologist [name], [place], [place]).  2 sons.  Never smoked.   FH: No premature CAD.   ROS: All systems reviewed and negative except as per HPI.  Current Outpatient Prescriptions  Medication Sig Dispense Refill  . atorvastatin (LIPITOR) 80 MG tablet Take 1 tablet by mouth  daily 90 tablet 1  . Coenzyme Q10-Vitamin E 100-300 MG-UNIT WAFR Take by mouth daily.    . Cyanocobalamin (VITAMIN B 12 PO) Take 1 tablet by mouth daily.     [name] dronedarone (MULTAQ) 400 MG tablet Take 1 tablet (400 mg total) by mouth 2 (two) times daily with a meal. 60 tablet 6  . fish oil-omega-3 fatty acids 1000 MG capsule Take 3 g by mouth daily.    . fluocinonide cream (LIDEX) 0.05 % Apply topically.    . hydrocortisone cream (RA ANTI-ITCH MAXIMUM STRENGTH) 1 % Apply topically.    [name] loratadine (CLARITIN) 10 MG tablet Take 10 mg by mouth as needed for allergies.    . metoprolol succinate (TOPROL-XL) 50 MG 24 hr tablet Take 50 mg by mouth 2 (two) times daily at 8 am and 10 pm.    . multivitamin (THERAGRAN) per tablet Take 1 tablet by mouth daily.    . Probiotic Product (PROBIOTIC FORMULA PO) Take 2 capsules by mouth daily.     . ranolazine (RANEXA) 500 MG 12 hr tablet Take 1 tablet (500 mg total) by mouth 2 (two) times daily. 180 tablet 3  . saw palmetto 80 MG capsule Take 80 mg by mouth 2 (two) times daily.    . sildenafil (VIAGRA) 100 MG tablet Take 1 tablet (100 mg total) by mouth as needed. 10 tablet 6  . warfarin (COUMADIN) 5 MG tablet 5-7.5 mg daily at 6 PM. Takes 7.5 mg on Monday and Friday and 5 mg all other days     No current facility-administered medications for this visit.    BP 130/70 mmHg  Pulse 60  Ht 5\' 8"  (1.727 m)  Wt 205 lb 12.8 oz (93.35 kg)  BMI 31.30 kg/m2  SpO2 97% General: NAD Neck: JVP 7 cm, no thyromegaly or thyroid nodule.  Lungs: Clear to auscultation bilaterally with normal respiratory effort. CV: Nondisplaced PMI.  Heart regular S1/S2, no S3/S4, 2/6 early SEM RUSB.  No peripheral edema.  No carotid bruit.  Normal pedal pulses.  Abdomen: Soft, nontender, no hepatosplenomegaly, no distention.  Neurologic: Alert and oriented x 3.  Psych: Normal affect. Extremities: No clubbing or cyanosis.   Assessment/Plan: 1. Atypical atrial flutter: Patient had a MAZE procedure with LAA ligation at the time of his valve replacement.  Since then, he has had occasional episodes of atypical atrial flutter. He had episodes in  10/13 and 3/15 that converted back to NSR on dronedarone. He was noted to  be back in atrial flutter in 6/15 in the setting of a CVA.  He had ablation at [facility] in 11/15 and is in NSR today. - Continue coumadin (not using NOAC due to valvular atrial fibrillation, do not have good data). This will need to be continued long term even with ablation given CVA.  - Continue current Toprol XL. - He will continue dronedarone and ranolazine (ranolazine used for antiarrhythmic properties) to maintain NSR.  QTc interval ok today.   2. 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  Valves looked normal on 6/15 echo but most recent echo at [facility] in 9/15 showed an elevated mean gradient of 11 mmHg across the mitral valve (aortic valve still appeared relatively normal). There was no mitral regurgitation.  He has no symptoms referrable to the mitral valve.  3. HTN: BP is reasonably controlled.  4. Hyperlipidemia: Good lipids in 9/15 on atorvastatin.  Mildly elevated transaminases when last checked, I will recheck today.  5. CVA: Suspect this was related to atrial flutter (despite LA appendage ligation).  He is now on coumadin. No residual neurologic deficits.  Continue long-term. 6. Thoracic aortic aneurysm: Dilated aortic root at sinuses of valsalva and ascending aorta to 5.0 cm.   - Continue Toprol XL given some evidence that beta blockers may attenuate thoracic aortic aneurysm growth.  - Repeat CTA chest to be done in 2/16 when he follows up at [facility].   Loralie Champagne 02/09/2014

## 2014-02-13 DIAGNOSIS — L812 Freckles: Secondary | ICD-10-CM | POA: Diagnosis not present

## 2014-02-13 DIAGNOSIS — L570 Actinic keratosis: Secondary | ICD-10-CM | POA: Diagnosis not present

## 2014-02-13 DIAGNOSIS — L821 Other seborrheic keratosis: Secondary | ICD-10-CM | POA: Diagnosis not present

## 2014-02-13 DIAGNOSIS — D225 Melanocytic nevi of trunk: Secondary | ICD-10-CM | POA: Diagnosis not present

## 2014-02-13 DIAGNOSIS — L280 Lichen simplex chronicus: Secondary | ICD-10-CM | POA: Diagnosis not present

## 2014-02-13 DIAGNOSIS — L738 Other specified follicular disorders: Secondary | ICD-10-CM | POA: Diagnosis not present

## 2014-02-13 DIAGNOSIS — D1801 Hemangioma of skin and subcutaneous tissue: Secondary | ICD-10-CM | POA: Diagnosis not present

## 2014-02-18 ENCOUNTER — Other Ambulatory Visit: Payer: Self-pay | Admitting: Cardiology

## 2014-02-21 DIAGNOSIS — H40013 Open angle with borderline findings, low risk, bilateral: Secondary | ICD-10-CM | POA: Diagnosis not present

## 2014-02-21 DIAGNOSIS — H5213 Myopia, bilateral: Secondary | ICD-10-CM | POA: Diagnosis not present

## 2014-02-21 DIAGNOSIS — H25013 Cortical age-related cataract, bilateral: Secondary | ICD-10-CM | POA: Diagnosis not present

## 2014-02-21 DIAGNOSIS — H52223 Regular astigmatism, bilateral: Secondary | ICD-10-CM | POA: Diagnosis not present

## 2014-03-10 ENCOUNTER — Ambulatory Visit (INDEPENDENT_AMBULATORY_CARE_PROVIDER_SITE_OTHER): Payer: Medicare Other | Admitting: Pharmacist

## 2014-03-10 DIAGNOSIS — I6340 Cerebral infarction due to embolism of unspecified cerebral artery: Secondary | ICD-10-CM | POA: Diagnosis not present

## 2014-03-10 DIAGNOSIS — I4892 Unspecified atrial flutter: Secondary | ICD-10-CM

## 2014-03-10 DIAGNOSIS — I4891 Unspecified atrial fibrillation: Secondary | ICD-10-CM | POA: Diagnosis not present

## 2014-03-10 DIAGNOSIS — Z5181 Encounter for therapeutic drug level monitoring: Secondary | ICD-10-CM

## 2014-03-10 LAB — POCT INR: INR: 2.3

## 2014-03-13 DIAGNOSIS — I4891 Unspecified atrial fibrillation: Secondary | ICD-10-CM | POA: Diagnosis not present

## 2014-03-18 DIAGNOSIS — R972 Elevated prostate specific antigen [PSA]: Secondary | ICD-10-CM | POA: Diagnosis not present

## 2014-04-14 DIAGNOSIS — I495 Sick sinus syndrome: Secondary | ICD-10-CM | POA: Diagnosis not present

## 2014-04-21 ENCOUNTER — Ambulatory Visit (INDEPENDENT_AMBULATORY_CARE_PROVIDER_SITE_OTHER): Payer: Medicare Other

## 2014-04-21 DIAGNOSIS — I6340 Cerebral infarction due to embolism of unspecified cerebral artery: Secondary | ICD-10-CM | POA: Diagnosis not present

## 2014-04-21 DIAGNOSIS — I4892 Unspecified atrial flutter: Secondary | ICD-10-CM

## 2014-04-21 DIAGNOSIS — I4891 Unspecified atrial fibrillation: Secondary | ICD-10-CM

## 2014-04-21 DIAGNOSIS — Z5181 Encounter for therapeutic drug level monitoring: Secondary | ICD-10-CM | POA: Diagnosis not present

## 2014-04-21 LAB — POCT INR: INR: 2.7

## 2014-04-24 ENCOUNTER — Other Ambulatory Visit: Payer: Self-pay

## 2014-04-24 MED ORDER — METOPROLOL SUCCINATE ER 50 MG PO TB24: 50.0000 mg | ORAL_TABLET | Freq: Two times a day (BID) | ORAL | Status: DC

## 2014-05-01 DIAGNOSIS — R9431 Abnormal electrocardiogram [ECG] [EKG]: Secondary | ICD-10-CM | POA: Diagnosis not present

## 2014-05-01 DIAGNOSIS — Z8679 Personal history of other diseases of the circulatory system: Secondary | ICD-10-CM | POA: Diagnosis not present

## 2014-05-01 DIAGNOSIS — I440 Atrioventricular block, first degree: Secondary | ICD-10-CM | POA: Diagnosis not present

## 2014-05-01 DIAGNOSIS — Z8673 Personal history of transient ischemic attack (TIA), and cerebral infarction without residual deficits: Secondary | ICD-10-CM | POA: Diagnosis not present

## 2014-05-01 DIAGNOSIS — I493 Ventricular premature depolarization: Secondary | ICD-10-CM | POA: Diagnosis not present

## 2014-05-01 DIAGNOSIS — I052 Rheumatic mitral stenosis with insufficiency: Secondary | ICD-10-CM | POA: Diagnosis not present

## 2014-05-01 DIAGNOSIS — I480 Paroxysmal atrial fibrillation: Secondary | ICD-10-CM | POA: Diagnosis not present

## 2014-05-01 DIAGNOSIS — I484 Atypical atrial flutter: Secondary | ICD-10-CM | POA: Diagnosis not present

## 2014-05-01 DIAGNOSIS — I77819 Aortic ectasia, unspecified site: Secondary | ICD-10-CM | POA: Diagnosis not present

## 2014-05-01 DIAGNOSIS — I491 Atrial premature depolarization: Secondary | ICD-10-CM | POA: Diagnosis not present

## 2014-05-01 DIAGNOSIS — Z952 Presence of prosthetic heart valve: Secondary | ICD-10-CM | POA: Diagnosis not present

## 2014-05-20 DIAGNOSIS — L239 Allergic contact dermatitis, unspecified cause: Secondary | ICD-10-CM | POA: Diagnosis not present

## 2014-06-06 ENCOUNTER — Ambulatory Visit (INDEPENDENT_AMBULATORY_CARE_PROVIDER_SITE_OTHER): Payer: Medicare Other | Admitting: *Deleted

## 2014-06-06 DIAGNOSIS — Z5181 Encounter for therapeutic drug level monitoring: Secondary | ICD-10-CM | POA: Diagnosis not present

## 2014-06-06 DIAGNOSIS — I6340 Cerebral infarction due to embolism of unspecified cerebral artery: Secondary | ICD-10-CM

## 2014-06-06 DIAGNOSIS — I4891 Unspecified atrial fibrillation: Secondary | ICD-10-CM | POA: Diagnosis not present

## 2014-06-06 DIAGNOSIS — I4892 Unspecified atrial flutter: Secondary | ICD-10-CM | POA: Diagnosis not present

## 2014-06-06 LAB — POCT INR: INR: 2.7

## 2014-06-09 ENCOUNTER — Telehealth: Payer: Self-pay | Admitting: Cardiology

## 2014-06-09 NOTE — Telephone Encounter (Signed)
Pt states he thinks he has been in at flutter for about 10 days.  Pt states his heart rate seems to be controlled, around 80, and he feels fine.  He states he sent a transmission to [facility] Friday 06/06/14. Pt states he was told by [facility] he was in at flutter, should probably have a cardioversion and recommended pt contact our office.   Pt given appt 06/10/14, 3PM  with Scott Weaver,PA,c  Pt thanked me for my help.

## 2014-06-09 NOTE — Telephone Encounter (Signed)
New Message       Pt calling stating that he is experiencing A-Flutter and thinks he needs to be considered for a cardioversion and wants to speak to Webb Silversmith about this. Please call back and advise.

## 2014-06-10 ENCOUNTER — Encounter: Payer: Self-pay | Admitting: Physician Assistant

## 2014-06-10 ENCOUNTER — Ambulatory Visit (INDEPENDENT_AMBULATORY_CARE_PROVIDER_SITE_OTHER): Payer: Medicare Other | Admitting: Physician Assistant

## 2014-06-10 VITALS — BP 110/72 | HR 82 | Ht 68.0 in | Wt 208.0 lb

## 2014-06-10 DIAGNOSIS — I10 Essential (primary) hypertension: Secondary | ICD-10-CM

## 2014-06-10 DIAGNOSIS — I099 Rheumatic heart disease, unspecified: Secondary | ICD-10-CM

## 2014-06-10 DIAGNOSIS — I6340 Cerebral infarction due to embolism of unspecified cerebral artery: Secondary | ICD-10-CM

## 2014-06-10 DIAGNOSIS — I484 Atypical atrial flutter: Secondary | ICD-10-CM | POA: Diagnosis not present

## 2014-06-10 DIAGNOSIS — E785 Hyperlipidemia, unspecified: Secondary | ICD-10-CM

## 2014-06-10 DIAGNOSIS — I7121 Aneurysm of the ascending aorta, without rupture: Secondary | ICD-10-CM

## 2014-06-10 DIAGNOSIS — Z8673 Personal history of transient ischemic attack (TIA), and cerebral infarction without residual deficits: Secondary | ICD-10-CM

## 2014-06-10 DIAGNOSIS — I712 Thoracic aortic aneurysm, without rupture: Secondary | ICD-10-CM

## 2014-06-10 DIAGNOSIS — Z954 Presence of other heart-valve replacement: Secondary | ICD-10-CM

## 2014-06-10 DIAGNOSIS — Z952 Presence of prosthetic heart valve: Secondary | ICD-10-CM

## 2014-06-10 NOTE — Progress Notes (Signed)
Cardiology Office Note   Date:  06/10/2014   ID:  Lenwood, Balsam Dec 10, 1940, MRN [id number]  PCP:  Scarlette Calico, MD  Cardiologist:  Dr. Loralie Champagne   Cardiologist at [facility]:  Dr. Festus Holts Electrophysiologist at [facility]:  Dr. Esperanza Heir   Chief Complaint  Patient presents with  . Atrial Fibrillation     History of Present Illness: FERMAN BASILIO is a 74 y.o. 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 He was started on dronedarone and coumadin with plans for cardioversion after 4 wks of therapeutic anticoagulation if he did not convert back to NSR on his own. He went back into NSR spontaneously. Ultimately came off of coumadin as he has undergone LAA ligation at the time of his surgery.  In 6/15, patient was admitted with transient dysarthria (resolved in about 10 minutes). However, he was found to have evidence for CVA by MRI. Therefore, he was started on coumadin. He was noted to be in atypical atrial flutter again. Ranolazine was added to dronedarone.   Last seen by Dr. [name] 02/2014. He had undergone RFCA for AFlutter at [facility] in 01/2014.  When he saw Dr. [name] he was in NSR.  Plan was to continue Coumadin even if though patient had an ablation given his history of stroke. He was continued on Dronedarone and ranolazine.  Of note, thoracic aortic aneurysm is followed at the [facility].  He was seen in follow-up at the [facility] in February. His cardiologist there felt that his Ranexa could be stopped as he remained in sinus rhythm after his ablation. Follow-up CT demonstrated stable aortic root aneurysmal dilatation ascending aorta at 5 cm.  Echocardiogram demonstrated normal LV function with stable mitral valve and aortic valve gradients. He went  hiking about 1-1/2 weeks ago. Around that time, he started to notice that his pulse was irregular. He denies any significant symptoms. He denies significant dyspnea with exertion. Overall, he is NYHA 2. He denies chest pain, syncope, orthopnea, PND or edema. He has been compliant with Coumadin. Last several INRs have been therapeutic.   Studies/Reports Reviewed Today:  Chest CT 05/01/14 (at [facility]) IMPRESSION: STABLE INTERVAL EXAM. 1. 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 The dimensions are stable on direct comparison the prior study. 2. Bioprosthetic aortic valve and mitral valve replacements, which appear well-positioned. 3. Normal pulmonary venous anatomy without pulmonary vein stenosis. 4. No left atrial or left atrial appendage thrombus.  5. Stable 3 mm nonspecific, noncalcified nodule in the left upper lobe. Based on current guidelines*, a follow-up unenhanced low-dose CT can be obtained in 12 months or at clinical discretion.  Echo 05/01/14 (at [facility]) - The left ventricle is normal in size. There is mild left ventricular hypertrophy. Left ventricular systolic function is normal. EF = 55  5% (2D biplane) Baseline left ventricular diastolic function was not evaluated due to prosthetic valve. - The right ventricle is normal in size. Right ventricular systolic function is normal. - The left atrial cavity is dilated. 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 Post mitral valve replacement. There is trivial mitral valve regurgitation. The peak gradient is 20 mmHg and the mean gradient is 4  mmHg. - Carpentier-Edwards prosthetic aortic valve (size #27). There is trivial aortic valve regurgitation. The peak gradient is 16 mmHg and the mean gradient is 7 mmHg. - The visualized aorta is dilated. Measurements - Sinus 4.6 cm. Mid ascending aorta 4.6 cm. - Compared with the  prior CC echocardiographic exam performed on 11/26/2013. Similar findings.  Echo 08/2013 - Moderate LVH. EF 55%-60%. - Aortic valve: Normal appearing AV bioprosthesis - Mitral valve: Normal appearing mitral bioprosthetic valve. - Left atrium: The atrium was moderately to severely dilated. - Atrial septum: No defect or patent foramen ovale was identified. - Pulmonary arteries: PA peak pressure: 32 mm Hg (S).  Carotid US 6/15 Bateral - 1% - 39% ICA stenosis.  Past Medical History: 1. Atrial fibrillation: s/p MAZE and LAA ligation in 2006. No documented atrial fibrillation since operation.  2. Atypical atrial flutter: 10/13, 3/15, and again in 6/15.  3. 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  - Echo 5/11 with EF 60-65%, normal bioprosthetic aortic valve, normal bioprosthetic mitral valve, moderate LAE.  - Echo 11/12 at [facility] looked ok per report.  - Echo (10/13) with EF 55-60%, mild LVH, normal bioprosthetic aortic and mitral valves, moderate LAE, PA systolic pressure 35 mmHg.  - Echo (6/15) with EF 60%, moderate LVH, normal-appearing bioprosthetic MV and AoV, PA systolic pressure 32 mmHg.  - Echo 9/15 [facility]) with EF 55%, mild LVH, PASP 52 mmHg, severe LAE, bioprosthetic mitral valve with no MR and mean gradient 11 mmHg, moderate TR, bioprosthetic aortic valve with no AI and mean gradient 9 mmHg, dilated ascending aorta to 5 cm.  4. Hyperlipidemia 5. L-spine surgery 6. LHC with no significant coronary disease in 2006 pre-op.  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 CTA chest 9/15 with dilated sinuses of valsalva to 5.0 cm and ascending aorta 4.9 cm.  Past Medical History  Diagnosis Date  . Psychosexual dysfunction with inhibited sexual excitement   . Other and unspecified hyperlipidemia   .  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 Lesion of ulnar nerve   . External hemorrhoids without mention of complication   . Acute rheumatic pericarditis   . Acute rheumatic heart disease, unspecified     childhood, age  60 & 26  . 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 Atrial fibrillation     history  . Diverticulosis     Past Surgical History  Procedure Laterality Date  . Laminectomies      10/1975 and in 03/2005  . 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 Cardioversion      3 times from 2004-2006  . Tonsillectomy      1950  . Colonoscopy    . Cardioversion N/A 09/26/2013    Procedure: CARDIOVERSION;  Surgeon: Larey Dresser, MD;  Location: [place];  Service: Cardiovascular;  Laterality: N/A;     Current Outpatient Prescriptions  Medication Sig Dispense Refill  . atorvastatin (LIPITOR) 80 MG tablet Take 1 tablet by mouth  daily 90 tablet 1  . clobetasol cream (TEMOVATE) 0.05 % as needed.    . Coenzyme Q10-Vitamin E 100-300 MG-UNIT WAFR Take by mouth daily.    . Cyanocobalamin (VITAMIN B 12 PO) Take 1 tablet by mouth daily.    [name] dronedarone (MULTAQ) 400 MG  tablet Take 1 tablet (400 mg total) by mouth 2 (two) times daily with a meal. 60 tablet 6  . fish oil-omega-3 fatty acids 1000 MG capsule Take 3 g by mouth daily.    . fluocinonide cream (LIDEX) 0.05 % Apply topically as needed (FOR ITCHING).     . hydrocortisone cream (RA ANTI-ITCH MAXIMUM STRENGTH) 1 % Apply 1 application topically as needed for itching.     . loratadine (CLARITIN) 10 MG tablet Take 10 mg by mouth as needed for allergies.    . metoprolol succinate (TOPROL-XL) 50 MG 24 hr tablet Take 1 tablet (50 mg total) by mouth 2 (two) times daily at 8 am and 10 pm. 180 tablet 2  . multivitamin (THERAGRAN) per tablet Take 1 tablet by mouth daily.    . Probiotic Product (PROBIOTIC FORMULA PO) Take 2 capsules by mouth daily.     . ranolazine (RANEXA) 500 MG 12 hr tablet Take 1 tablet  (500 mg total) by mouth 2 (two) times daily. 180 tablet 3  . saw palmetto 80 MG capsule Take 80 mg by mouth 2 (two) times daily.    . sildenafil (VIAGRA) 100 MG tablet Take 1 tablet (100 mg total) by mouth as needed. 10 tablet 6  . warfarin (COUMADIN) 5 MG tablet Take as directed by coumadin clinic 50 tablet 2   No current facility-administered medications for this visit.    Allergies:   Naproxen    Social History:  The patient  reports that he has never smoked. He has never used smokeless tobacco. He reports that he drinks alcohol. He reports that he does not use illicit drugs.   Family History:  The patient's family history includes Cancer in his father; Diabetes in his brother and paternal aunt; Heart attack in his maternal grandfather; Macular degeneration in his mother; Stroke in his mother.    ROS:   Please see the history of present illness.   Review of Systems  Cardiovascular: Positive for dyspnea on exertion and irregular heartbeat.  Respiratory: Positive for snoring.   All other systems reviewed and are negative.     PHYSICAL EXAM: VS:  BP 110/72 mmHg  Pulse 82  Ht 5\' 8"  (1.727 m)  Wt 208 lb (94.348 kg)  BMI 31.63 kg/m2    Wt Readings from Last 3 Encounters:  06/10/14 208 lb (94.348 kg)  02/07/14 205 lb 12.8 oz (93.35 kg)  01/20/14 209 lb 3.2 oz (94.892 kg)     GEN: Well nourished, well developed, in no acute distress HEENT: normal Neck: no JVD, no masses Cardiac:  Normal S1/S2, irregularly irregular rhythm; no murmur ,  no rubs or gallops, no edema  Respiratory:  clear to auscultation bilaterally, no wheezing, rhonchi or rales. GI: soft, nontender, nondistended, + BS MS: no deformity or atrophy Skin: warm and dry  Neuro:  CNs II-XII intact, Strength and sensation are intact Psych: Normal affect   EKG:  EKG is ordered today.  It demonstrates:   Atrial flutter, HR 82, variable AV block   Recent Labs: 11/26/2013: Magnesium 2.2; TSH 3.060 12/19/2013: BUN  26*; Creatinine 1.3; Hemoglobin 12.5*; Platelets 277.0; Potassium 4.5; Sodium 137 02/07/2014: ALT 45    Lipid Panel    Component Value Date/Time   CHOL 102 11/26/2013   CHOL 114 08/06/2013 0647   TRIG 93 11/26/2013   TRIG 62 08/06/2013 0647   HDL 25* 11/26/2013   CHOLHDL 2.4 08/06/2013 0647   VLDL 19 11/26/2013   LDLCALC 58 11/26/2013  LDLCALC 54 08/06/2013 0647     Lab Results  Component Value Date   INR 2.7 06/06/2014   INR 2.7 04/21/2014   INR 2.3 03/10/2014    ASSESSMENT AND PLAN:  Atypical atrial flutter He is back in atrial flutter. For the most part, he is asymptomatic. He was previously stable on a combination of Multaq and Ranexa. I reviewed his case by telephone today with Dr. [name]. We have elected to place him back on Ranexa. He will be brought back in close follow-up. We will plan on arranging a cardioversion next week as well. If he remains in atrial flutter upon follow-up, he will proceed with cardioversion the next day. We will obtain a basic metabolic panel and TSH when he returns. I will also obtain a follow-up INR at that time.  -  Start Ranexa 500 mg twice a day  -  Follow up next week with plans to proceed with cardioversion the next day if he remains in atrial flutter.  Rheumatic heart disease S/P MVR (mitral valve replacement) and S/P AVR (aortic valve replacement) Recent echocardiogram at the [facility] demonstrates stable aortic and mitral valve gradients. Continue SBE prophylaxis.  Essential hypertension Controlled.  Hyperlipidemia Continue statin.  History of CVA (cerebrovascular accident) Continue Coumadin, statin.  Thoracic ascending aortic aneurysm  This is followed by CT scan at the [facility]. Recent measurements have been stable.   Current medicines are reviewed at length with the patient today.  The patient does not have concerns regarding medicines.  The following changes have been made:    Start Ranexa 500 mg twice a  day   Labs/ tests ordered today include:  Orders Placed This Encounter  Procedures  . Basic metabolic panel  . TSH  . EKG 12-Lead    Disposition:   FU with me in 1 week.   Signed, Versie Starks, MHS 06/10/2014 3:40 PM    [facility] [place], [place], [place]  [address] Phone: [phone]; Fax: [phone]

## 2014-06-10 NOTE — Patient Instructions (Signed)
Your physician recommends that you schedule a follow-up appointment next week with Richardson Dopp, PA on a day when Dr [name]   Will also need a Coumadin appointment same day to check INR  Your physician has recommended you make the following change in your medication:  1) Resume Ranexa 500 mg twice daily  Your physician recommends that you return for lab work same day as follow up appointment with Richardson Dopp, PA: BMP/ TSH  Your physician has recommended that you have a Cardioversion (DCCV). Electrical Cardioversion uses a jolt of electricity to your heart either through paddles or wired patches attached to your chest. This is a controlled, usually prescheduled, procedure. Defibrillation is done under light anesthesia in the hospital, and you usually go home the day of the procedure. This is done to get your heart back into a normal rhythm. You are not awake for the procedure. Please see the instruction sheet given to you today.---will schedule this for the day after your follow up appointment.  You will receive instructions in regards to Cardioversion at your follow up

## 2014-06-11 ENCOUNTER — Other Ambulatory Visit: Payer: Self-pay

## 2014-06-11 ENCOUNTER — Telehealth: Payer: Self-pay | Admitting: Physician Assistant

## 2014-06-11 MED ORDER — WARFARIN SODIUM 5 MG PO TABS: ORAL_TABLET | ORAL | Status: DC

## 2014-06-11 NOTE — Telephone Encounter (Signed)
I called pt to schedule appt w/Scott W. PA 4/13, DCCV 4/14. Pt aware will go over DCCV instructions on 06/18/14. Pt agreeable to plan of care.

## 2014-06-11 NOTE — Telephone Encounter (Signed)
New Message  Pt calling to speak w/ Rn about cardioversion. Please call back and discuss.

## 2014-06-16 ENCOUNTER — Encounter: Payer: Self-pay | Admitting: *Deleted

## 2014-06-18 ENCOUNTER — Telehealth: Payer: Self-pay | Admitting: Physician Assistant

## 2014-06-18 ENCOUNTER — Encounter: Payer: Self-pay | Admitting: Physician Assistant

## 2014-06-18 ENCOUNTER — Ambulatory Visit (INDEPENDENT_AMBULATORY_CARE_PROVIDER_SITE_OTHER): Payer: Medicare Other | Admitting: Physician Assistant

## 2014-06-18 ENCOUNTER — Ambulatory Visit (INDEPENDENT_AMBULATORY_CARE_PROVIDER_SITE_OTHER): Payer: Medicare Other | Admitting: *Deleted

## 2014-06-18 VITALS — BP 128/80 | HR 72 | Ht 68.0 in | Wt 214.0 lb

## 2014-06-18 DIAGNOSIS — I10 Essential (primary) hypertension: Secondary | ICD-10-CM | POA: Diagnosis not present

## 2014-06-18 DIAGNOSIS — I6340 Cerebral infarction due to embolism of unspecified cerebral artery: Secondary | ICD-10-CM

## 2014-06-18 DIAGNOSIS — I484 Atypical atrial flutter: Secondary | ICD-10-CM

## 2014-06-18 DIAGNOSIS — Z5181 Encounter for therapeutic drug level monitoring: Secondary | ICD-10-CM

## 2014-06-18 DIAGNOSIS — Z954 Presence of other heart-valve replacement: Secondary | ICD-10-CM

## 2014-06-18 DIAGNOSIS — I4891 Unspecified atrial fibrillation: Secondary | ICD-10-CM | POA: Diagnosis not present

## 2014-06-18 DIAGNOSIS — I712 Thoracic aortic aneurysm, without rupture: Secondary | ICD-10-CM

## 2014-06-18 DIAGNOSIS — E785 Hyperlipidemia, unspecified: Secondary | ICD-10-CM

## 2014-06-18 DIAGNOSIS — Z8673 Personal history of transient ischemic attack (TIA), and cerebral infarction without residual deficits: Secondary | ICD-10-CM

## 2014-06-18 DIAGNOSIS — Z952 Presence of prosthetic heart valve: Secondary | ICD-10-CM

## 2014-06-18 DIAGNOSIS — I099 Rheumatic heart disease, unspecified: Secondary | ICD-10-CM

## 2014-06-18 DIAGNOSIS — I7121 Aneurysm of the ascending aorta, without rupture: Secondary | ICD-10-CM

## 2014-06-18 DIAGNOSIS — I4892 Unspecified atrial flutter: Secondary | ICD-10-CM | POA: Diagnosis not present

## 2014-06-18 LAB — POCT INR: INR: 3.2

## 2014-06-18 NOTE — Patient Instructions (Addendum)
Medication Instructions:  Your physician recommends that you continue on your current medications as directed. Please refer to the Current Medication list given to you today.   Labwork: STAT TODAY; BMET, CBC W/DIFF  Testing/Procedures: YOU HAVE BEEN SCHEDULED FOR A CARDIOVERSION 06/19/14; SEE INSTRUCTION SHEET  Follow-Up: 2 WEEK FOLLOW UP WITH [place], [facility] 07/03/14 @ 12:10   Any Other Special Instructions Will Be Listed Below (If Applicable).

## 2014-06-18 NOTE — Telephone Encounter (Signed)
    Called with results of stat labs drawn in office today. CBC and BMET essentially normal. I called to let the patient know. He is planning to have DCCV tomorrow.  Will forward to [employer] PA-C   Angelena Form PA-C  MHS

## 2014-06-18 NOTE — H&P (Signed)
History and Physical   Date:  06/18/2014   ID:  [name] [name], DOB 1940-05-13, MRN [id number]  PCP:  Scarlette Calico, MD  Cardiologist:  Dr. Loralie Champagne   Cardiologist at [facility]:  Dr. Festus Holts Electrophysiologist at [facility]  Dr. Esperanza Heir   Chief Complaint  Patient presents with  . Atrial Flutter     History of Present Illness: [name] [name] is a 74 y.o. 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 He was started on dronedarone and coumadin with plans for cardioversion after 4 wks of therapeutic anticoagulation if he did not convert back to NSR on his own. He went back into NSR spontaneously. Ultimately came off of coumadin as he has undergone LAA ligation at the time of his surgery.  In 6/15, patient was admitted with transient dysarthria (resolved in about 10 minutes). However, he was found to have evidence for CVA by MRI. Therefore, he was started on coumadin. He was noted to be in atypical atrial flutter again. Ranolazine was added to dronedarone.   Last seen by Dr. [name] 02/2014. He had undergone RFCA for AFlutter at [facility] in 01/2014.  When he saw Dr. [name] he was in NSR.  Plan was to continue Coumadin even though patient had an ablation given his history of stroke. He was continued on Dronedarone and ranolazine.  Of note, thoracic aortic aneurysm is followed at the [facility] clinic.  He was seen in follow-up at the [facility] in February. His cardiologist there felt that his Ranexa could be stopped as he remained in sinus rhythm after his ablation. Follow-up CT demonstrated stable aortic root aneurysmal dilatation ascending aorta at 5 cm.  Echocardiogram demonstrated normal LV function with stable mitral valve and aortic valve gradients.  I saw him last  week. He was in atrial flutter. Heart rate was controlled. I discussed his case with Dr. [name]. We elected to place him back on Ranexa. He was brought back today for follow-up with an eye towards proceeding with cardioversion tomorrow if he remains in atrial flutter.  He continues to do well.  The patient denies chest pain, shortness of breath, syncope, orthopnea, PND or significant pedal edema.    Studies/Reports Reviewed Today:  Chest CT 05/01/14 (at [facility]) IMPRESSION: STABLE INTERVAL EXAM. 1. 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 The dimensions are stable on direct comparison the prior study. 2. Bioprosthetic aortic valve and mitral valve replacements, which appear well-positioned. 3. Normal pulmonary venous anatomy without pulmonary vein stenosis. 4. No left atrial or left atrial appendage thrombus.  5. Stable 3 mm nonspecific, noncalcified nodule in the left upper lobe. Based on current guidelines*, a follow-up unenhanced low-dose CT can be obtained in 12 months or at clinical discretion.  Echo 05/01/14 (at [facility]) - The left ventricle is normal in size. There is mild left ventricular hypertrophy. Left ventricular systolic function is normal. EF = 55  5% (2D biplane) Baseline left ventricular diastolic function was not evaluated due to prosthetic valve. - The right ventricle is normal in size. Right ventricular systolic function is normal. - The left atrial cavity is dilated. 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 Post mitral valve replacement. There  is trivial mitral valve regurgitation. The peak gradient is 20 mmHg and the mean gradient is 4 mmHg. - Carpentier-Edwards prosthetic aortic valve (size #27). There is trivial aortic valve regurgitation. The peak gradient is 16 mmHg and the mean gradient is 7 mmHg. - The visualized aorta is dilated. Measurements -  Sinus 4.6 cm. Mid ascending aorta 4.6 cm. - Compared with the prior CC echocardiographic exam performed on 11/26/2013. Similar findings.  Echo 08/2013 - Moderate LVH. EF 55%-60%. - Aortic valve: Normal appearing AV bioprosthesis - Mitral valve: Normal appearing mitral bioprosthetic valve. - Left atrium: The atrium was moderately to severely dilated. - Atrial septum: No defect or patent foramen ovale was identified. - Pulmonary arteries: PA peak pressure: 32 mm Hg (S).  Carotid US 6/15 Bateral - 1% - 39% ICA stenosis.  Past Medical History: 1. Atrial fibrillation: s/p MAZE and LAA ligation in 2006. No documented atrial fibrillation since operation.  2. Atypical atrial flutter: 10/13, 3/15, and again in 6/15.  3. 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  - Echo 5/11 with EF 60-65%, normal bioprosthetic aortic valve, normal bioprosthetic mitral valve, moderate LAE.  - Echo 11/12 at [facility] looked ok per report.  - Echo (10/13) with EF 55-60%, mild LVH, normal bioprosthetic aortic and mitral valves, moderate LAE, PA systolic pressure 35 mmHg.  - Echo (6/15) with EF 60%, moderate LVH, normal-appearing bioprosthetic MV and AoV, PA systolic pressure 32 mmHg.  - Echo 9/15 [facility]) with EF 55%, mild LVH, PASP 52 mmHg, severe LAE, bioprosthetic mitral valve with no MR and mean gradient 11 mmHg, moderate TR, bioprosthetic aortic valve with no AI and mean gradient 9 mmHg, dilated ascending aorta to 5 cm.  4. Hyperlipidemia 5. L-spine surgery 6. LHC with no significant coronary disease in 2006 pre-op.  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 CTA chest 9/15 with dilated sinuses of valsalva to 5.0 cm and ascending aorta 4.9 cm.  Past Medical History  Diagnosis Date  . Psychosexual dysfunction with inhibited  sexual excitement   . Other and unspecified hyperlipidemia   . 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 Lesion of ulnar nerve   . External hemorrhoids without mention of complication   . Acute rheumatic pericarditis   . Acute rheumatic heart disease, unspecified     childhood, age  77 & 64  . 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 Atrial fibrillation     history  . Diverticulosis     Past Surgical History  Procedure Laterality Date  . Laminectomies      10/1975 and in 03/2005  . 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 Cardioversion      3 times from 2004-2006  . Tonsillectomy      1950  . Colonoscopy    . Cardioversion N/A 09/26/2013    Procedure: CARDIOVERSION;  Surgeon: Larey Dresser, MD;  Location: [place];  Service: Cardiovascular;  Laterality: N/A;     Current Outpatient Prescriptions  Medication Sig Dispense Refill  . atorvastatin (LIPITOR) 80 MG tablet Take 1 tablet by mouth  daily 90 tablet 1  . clobetasol cream (TEMOVATE) 0.05 % as needed.    . Coenzyme Q10-Vitamin E 100-300 MG-UNIT WAFR Take by mouth daily.    . Cyanocobalamin (VITAMIN  B 12 PO) Take 1 tablet by mouth daily.    [name] dronedarone (MULTAQ) 400 MG tablet Take 1 tablet (400 mg total) by mouth 2 (two) times daily with a meal. 60 tablet 6  . fish oil-omega-3 fatty acids 1000 MG capsule Take 3 g by mouth daily.    . fluocinonide cream (LIDEX) 0.05 % Apply topically as needed (FOR ITCHING).     . hydrocortisone cream (RA ANTI-ITCH MAXIMUM STRENGTH) 1 % Apply 1 application topically as needed for itching.     . loratadine (CLARITIN) 10 MG tablet Take 10 mg by mouth as needed for allergies.    . metoprolol succinate (TOPROL-XL) 50 MG 24 hr tablet Take 1 tablet (50 mg total) by mouth 2 (two) times daily at 8 am and 10 pm. 180 tablet 2  . multivitamin (THERAGRAN) per tablet Take 1 tablet by mouth daily.    . Probiotic Product (PROBIOTIC FORMULA PO) Take 2 capsules by mouth  daily.     . ranolazine (RANEXA) 500 MG 12 hr tablet Take 1 tablet (500 mg total) by mouth 2 (two) times daily. 180 tablet 3  . saw palmetto 80 MG capsule Take 80 mg by mouth 2 (two) times daily.    . sildenafil (VIAGRA) 100 MG tablet Take 1 tablet (100 mg total) by mouth as needed. 10 tablet 6  . warfarin (COUMADIN) 5 MG tablet Take as directed by coumadin clinic 120 tablet 1   No current facility-administered medications for this visit.    Allergies:   Naproxen    Social History:  The patient  reports that he has never smoked. He has never used smokeless tobacco. He reports that he drinks alcohol. He reports that he does not use illicit drugs.   Family History:  The patient's family history includes Cancer in his father; Diabetes in his brother and paternal aunt; Heart attack in his maternal grandfather; Macular degeneration in his mother; Stroke in his mother.    ROS:   Please see the history of present illness.   Review of Systems  Cardiovascular: Positive for dyspnea on exertion and irregular heartbeat.  Respiratory: Positive for snoring.   All other systems reviewed and are negative.     PHYSICAL EXAM: VS:  BP 128/80 mmHg  Pulse 72  Ht 5\' 8"  (1.727 m)  Wt 214 lb (97.07 kg)  BMI 32.55 kg/m2    Wt Readings from Last 3 Encounters:  06/10/14 208 lb (94.348 kg)  02/07/14 205 lb 12.8 oz (93.35 kg)  01/20/14 209 lb 3.2 oz (94.892 kg)     GEN: Well nourished, well developed, in no acute distress HEENT: normal Neck: no JVD, no masses Cardiac:  Normal S1/S2, irregularly irregular rhythm; no murmur ,  no rubs or gallops, no edema  Respiratory:  clear to auscultation bilaterally, no wheezing, rhonchi or rales. GI: soft, nontender, nondistended, + BS MS: no deformity or atrophy Skin: warm and dry  Neuro:  CNs II-XII intact, Strength and sensation are intact Psych: Normal affect   EKG:  EKG is ordered today.  It demonstrates:   Atrial flutter with variable AV block, HR  72   Recent Labs: 11/26/2013: Magnesium 2.2; TSH 3.060 12/19/2013: BUN 26*; Creatinine 1.3; Hemoglobin 12.5*; Platelets 277.0; Potassium 4.5; Sodium 137 02/07/2014: ALT 45    Lipid Panel    Component Value Date/Time   CHOL 102 11/26/2013   CHOL 114 08/06/2013 0647   TRIG 93 11/26/2013   TRIG 62 08/06/2013 0647   HDL  25* 11/26/2013   CHOLHDL 2.4 08/06/2013 0647   VLDL 19 11/26/2013   LDLCALC 58 11/26/2013   LDLCALC 54 08/06/2013 0647     Lab Results  Component Value Date   INR 3.2 06/18/2014   INR 2.7 06/06/2014   INR 2.7 04/21/2014    ASSESSMENT AND PLAN:  Atypical atrial flutter He remains in AFlutter.  Rate is controlled. He has been taking Ranexa as prescribed.  INR is therapeutic today. Will proceed with DCCV tomorrow as planned.  Risks and benefits have been discussed with the patient and he agrees to proceed.    Rheumatic heart disease S/P MVR (mitral valve replacement) and S/P AVR (aortic valve replacement) Recent echocardiogram at the [facility] demonstrates stable aortic and mitral valve gradients. Continue SBE prophylaxis.  Essential hypertension Controlled.  Hyperlipidemia Continue statin.  History of CVA (cerebrovascular accident) Continue Coumadin, statin.  Thoracic ascending aortic aneurysm  This is followed by CT scan at the [facility]. Recent measurements have been stable.   Current medicines are reviewed at length with the patient today.  The patient does not have concerns regarding medicines.  The following changes have been made:    None    Labs/ tests ordered today include:   Orders Placed This Encounter  Procedures  . Basic Metabolic Panel (BMET)  . CBC w/Diff  . EKG 12-Lead    Disposition:   FU with Dr. Loralie Champagne or me 2 weeks.    Signed, Versie Starks, MHS 06/18/2014 3:03 PM    [facility] [place], [place],   [address] Phone: [phone]; Fax: [phone]

## 2014-06-18 NOTE — Progress Notes (Signed)
Cardiology Office Note   Date:  06/18/2014   ID:  [name] [name] May 09, 1940, MRN [id number]  PCP:  Scarlette Calico, MD  Cardiologist:  Dr. Loralie Champagne   Cardiologist at [facility]:  Dr. Festus Holts Electrophysiologist at [facility]:  Dr. Esperanza Heir   Chief Complaint  Patient presents with  . Atrial Flutter     History of Present Illness: [name] [name] is a 74 y.o. 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 He was started on dronedarone and coumadin with plans for cardioversion after 4 wks of therapeutic anticoagulation if he did not convert back to NSR on his own. He went back into NSR spontaneously. Ultimately came off of coumadin as he has undergone LAA ligation at the time of his surgery.  In 6/15, patient was admitted with transient dysarthria (resolved in about 10 minutes). However, he was found to have evidence for CVA by MRI. Therefore, he was started on coumadin. He was noted to be in atypical atrial flutter again. Ranolazine was added to dronedarone.   Last seen by Dr. [name] 02/2014. He had undergone RFCA for AFlutter at [facility] in 01/2014.  When he saw Dr. [name] he was in NSR.  Plan was to continue Coumadin even though patient had an ablation given his history of stroke. He was continued on Dronedarone and ranolazine.  Of note, thoracic aortic aneurysm is followed at the [facility] clinic.  He was seen in follow-up at the [facility] in February. His cardiologist there felt that his Ranexa could be stopped as he remained in sinus rhythm after his ablation. Follow-up CT demonstrated stable aortic root aneurysmal dilatation ascending aorta at 5 cm.  Echocardiogram demonstrated normal LV function with stable mitral valve and aortic valve gradients.  I saw him last  week. He was in atrial flutter. Heart rate was controlled. I discussed his case with Dr. [name]. We elected to place him back on Ranexa. He was brought back today for follow-up with an eye towards proceeding with cardioversion tomorrow if he remains in atrial flutter.  He continues to do well.  The patient denies chest pain, shortness of breath, syncope, orthopnea, PND or significant pedal edema.    Studies/Reports Reviewed Today:  Chest CT 05/01/14 (at [facility]) IMPRESSION: STABLE INTERVAL EXAM. 1. 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 The dimensions are stable on direct comparison the prior study. 2. Bioprosthetic aortic valve and mitral valve replacements, which appear well-positioned. 3. Normal pulmonary venous anatomy without pulmonary vein stenosis. 4. No left atrial or left atrial appendage thrombus.  5. Stable 3 mm nonspecific, noncalcified nodule in the left upper lobe. Based on current guidelines*, a follow-up unenhanced low-dose CT can be obtained in 12 months or at clinical discretion.  Echo 05/01/14 (at [facility]) - The left ventricle is normal in size. There is mild left ventricular hypertrophy. Left ventricular systolic function is normal. EF = 55  5% (2D biplane) Baseline left ventricular diastolic function was not evaluated due to prosthetic valve. - The right ventricle is normal in size. Right ventricular systolic function is normal. - The left atrial cavity is dilated. 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 Post mitral valve replacement. There  is trivial mitral valve regurgitation. The peak gradient is 20 mmHg and the mean gradient is 4 mmHg. - Carpentier-Edwards prosthetic aortic valve (size #27). There is trivial aortic valve regurgitation. The peak gradient is 16 mmHg and the mean gradient is 7 mmHg. - The visualized aorta is dilated. Measurements -  Sinus 4.6 cm. Mid ascending aorta 4.6 cm. - Compared with the prior CC echocardiographic exam performed on 11/26/2013. Similar findings.  Echo 08/2013 - Moderate LVH. EF 55%-60%. - Aortic valve: Normal appearing AV bioprosthesis - Mitral valve: Normal appearing mitral bioprosthetic valve. - Left atrium: The atrium was moderately to severely dilated. - Atrial septum: No defect or patent foramen ovale was identified. - Pulmonary arteries: PA peak pressure: 32 mm Hg (S).  Carotid US 6/15 Bateral - 1% - 39% ICA stenosis.  Past Medical History: 1. Atrial fibrillation: s/p MAZE and LAA ligation in 2006. No documented atrial fibrillation since operation.  2. Atypical atrial flutter: 10/13, 3/15, and again in 6/15.  3. 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  - Echo 5/11 with EF 60-65%, normal bioprosthetic aortic valve, normal bioprosthetic mitral valve, moderate LAE.  - Echo 11/12 at [facility] looked ok per report.  - Echo (10/13) with EF 55-60%, mild LVH, normal bioprosthetic aortic and mitral valves, moderate LAE, PA systolic pressure 35 mmHg.  - Echo (6/15) with EF 60%, moderate LVH, normal-appearing bioprosthetic MV and AoV, PA systolic pressure 32 mmHg.  - Echo 9/15 [facility]) with EF 55%, mild LVH, PASP 52 mmHg, severe LAE, bioprosthetic mitral valve with no MR and mean gradient 11 mmHg, moderate TR, bioprosthetic aortic valve with no AI and mean gradient 9 mmHg, dilated ascending aorta to 5 cm.  4. Hyperlipidemia 5. L-spine surgery 6. LHC with no significant coronary disease in 2006 pre-op.  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 CTA chest 9/15 with dilated sinuses of valsalva to 5.0 cm and ascending aorta 4.9 cm.  Past Medical History  Diagnosis Date  . Psychosexual dysfunction with inhibited  sexual excitement   . Other and unspecified hyperlipidemia   . 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 Lesion of ulnar nerve   . External hemorrhoids without mention of complication   . Acute rheumatic pericarditis   . Acute rheumatic heart disease, unspecified     childhood, age  88 & 84  . 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 Atrial fibrillation     history  . Diverticulosis     Past Surgical History  Procedure Laterality Date  . Laminectomies      10/1975 and in 03/2005  . 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 Cardioversion      3 times from 2004-2006  . Tonsillectomy      1950  . Colonoscopy    . Cardioversion N/A 09/26/2013    Procedure: CARDIOVERSION;  Surgeon: Larey Dresser, MD;  Location: [place];  Service: Cardiovascular;  Laterality: N/A;     Current Outpatient Prescriptions  Medication Sig Dispense Refill  . atorvastatin (LIPITOR) 80 MG tablet Take 1 tablet by mouth  daily 90 tablet 1  . clobetasol cream (TEMOVATE) 0.05 % as needed.    . Coenzyme Q10-Vitamin E 100-300 MG-UNIT WAFR Take by mouth daily.    . Cyanocobalamin (VITAMIN  B 12 PO) Take 1 tablet by mouth daily.    [name] dronedarone (MULTAQ) 400 MG tablet Take 1 tablet (400 mg total) by mouth 2 (two) times daily with a meal. 60 tablet 6  . fish oil-omega-3 fatty acids 1000 MG capsule Take 3 g by mouth daily.    . fluocinonide cream (LIDEX) 0.05 % Apply topically as needed (FOR ITCHING).     . hydrocortisone cream (RA ANTI-ITCH MAXIMUM STRENGTH) 1 % Apply 1 application topically as needed for itching.     . loratadine (CLARITIN) 10 MG tablet Take 10 mg by mouth as needed for allergies.    . metoprolol succinate (TOPROL-XL) 50 MG 24 hr tablet Take 1 tablet (50 mg total) by mouth 2 (two) times daily at 8 am and 10 pm. 180 tablet 2  . multivitamin (THERAGRAN) per tablet Take 1 tablet by mouth daily.    . Probiotic Product (PROBIOTIC FORMULA PO) Take 2 capsules by mouth  daily.     . ranolazine (RANEXA) 500 MG 12 hr tablet Take 1 tablet (500 mg total) by mouth 2 (two) times daily. 180 tablet 3  . saw palmetto 80 MG capsule Take 80 mg by mouth 2 (two) times daily.    . sildenafil (VIAGRA) 100 MG tablet Take 1 tablet (100 mg total) by mouth as needed. 10 tablet 6  . warfarin (COUMADIN) 5 MG tablet Take as directed by coumadin clinic 120 tablet 1   No current facility-administered medications for this visit.    Allergies:   Naproxen    Social History:  The patient  reports that he has never smoked. He has never used smokeless tobacco. He reports that he drinks alcohol. He reports that he does not use illicit drugs.   Family History:  The patient's family history includes Cancer in his father; Diabetes in his brother and paternal aunt; Heart attack in his maternal grandfather; Macular degeneration in his mother; Stroke in his mother.    ROS:   Please see the history of present illness.   Review of Systems  Cardiovascular: Positive for dyspnea on exertion and irregular heartbeat.  Respiratory: Positive for snoring.   All other systems reviewed and are negative.     PHYSICAL EXAM: VS:  BP 128/80 mmHg  Pulse 72  Ht 5\' 8"  (1.727 m)  Wt 214 lb (97.07 kg)  BMI 32.55 kg/m2    Wt Readings from Last 3 Encounters:  06/10/14 208 lb (94.348 kg)  02/07/14 205 lb 12.8 oz (93.35 kg)  01/20/14 209 lb 3.2 oz (94.892 kg)     GEN: Well nourished, well developed, in no acute distress HEENT: normal Neck: no JVD, no masses Cardiac:  Normal S1/S2, irregularly irregular rhythm; no murmur ,  no rubs or gallops, no edema  Respiratory:  clear to auscultation bilaterally, no wheezing, rhonchi or rales. GI: soft, nontender, nondistended, + BS MS: no deformity or atrophy Skin: warm and dry  Neuro:  CNs II-XII intact, Strength and sensation are intact Psych: Normal affect   EKG:  EKG is ordered today.  It demonstrates:   Atrial flutter with variable AV block, HR  72   Recent Labs: 11/26/2013: Magnesium 2.2; TSH 3.060 12/19/2013: BUN 26*; Creatinine 1.3; Hemoglobin 12.5*; Platelets 277.0; Potassium 4.5; Sodium 137 02/07/2014: ALT 45    Lipid Panel    Component Value Date/Time   CHOL 102 11/26/2013   CHOL 114 08/06/2013 0647   TRIG 93 11/26/2013   TRIG 62 08/06/2013 0647   HDL  25* 11/26/2013   CHOLHDL 2.4 08/06/2013 0647   VLDL 19 11/26/2013   LDLCALC 58 11/26/2013   LDLCALC 54 08/06/2013 0647     Lab Results  Component Value Date   INR 3.2 06/18/2014   INR 2.7 06/06/2014   INR 2.7 04/21/2014    ASSESSMENT AND PLAN:  Atypical atrial flutter He remains in AFlutter.  Rate is controlled. He has been taking Ranexa as prescribed.  INR is therapeutic today. Will proceed with DCCV tomorrow as planned.  Risks and benefits have been discussed with the patient and he agrees to proceed.    Rheumatic heart disease S/P MVR (mitral valve replacement) and S/P AVR (aortic valve replacement) Recent echocardiogram at the [facility] demonstrates stable aortic and mitral valve gradients. Continue SBE prophylaxis.  Essential hypertension Controlled.  Hyperlipidemia Continue statin.  History of CVA (cerebrovascular accident) Continue Coumadin, statin.  Thoracic ascending aortic aneurysm  This is followed by CT scan at the [facility]. Recent measurements have been stable.   Current medicines are reviewed at length with the patient today.  The patient does not have concerns regarding medicines.  The following changes have been made:    None    Labs/ tests ordered today include:   Orders Placed This Encounter  Procedures  . Basic Metabolic Panel (BMET)  . CBC w/Diff  . EKG 12-Lead    Disposition:   FU with Dr. Loralie Champagne or me 2 weeks.    Signed, Versie Starks, MHS 06/18/2014 3:03 PM    [facility] [place], [place], [place]  [address] Phone: [phone]; Fax: [phone]

## 2014-06-19 ENCOUNTER — Ambulatory Visit (HOSPITAL_COMMUNITY): Payer: Medicare Other | Admitting: Anesthesiology

## 2014-06-19 ENCOUNTER — Encounter (HOSPITAL_COMMUNITY)
Admission: RE | Disposition: A | Discharge status: Home Health | Payer: Medicare Other | Source: Ambulatory Visit | Attending: Cardiology

## 2014-06-19 ENCOUNTER — Ambulatory Visit (HOSPITAL_COMMUNITY)
Admission: RE | Admit: 2014-06-19 | Discharge: 2014-06-19 | Disposition: A | Discharge status: Home Health | Payer: Medicare Other | Source: Ambulatory Visit | Attending: Cardiology | Admitting: Cardiology

## 2014-06-19 ENCOUNTER — Encounter (HOSPITAL_COMMUNITY): Payer: Self-pay | Admitting: *Deleted

## 2014-06-19 DIAGNOSIS — I712 Thoracic aortic aneurysm, without rupture: Secondary | ICD-10-CM | POA: Insufficient documentation

## 2014-06-19 DIAGNOSIS — I482 Chronic atrial fibrillation, unspecified: Secondary | ICD-10-CM | POA: Diagnosis present

## 2014-06-19 DIAGNOSIS — Z8673 Personal history of transient ischemic attack (TIA), and cerebral infarction without residual deficits: Secondary | ICD-10-CM | POA: Insufficient documentation

## 2014-06-19 DIAGNOSIS — I10 Essential (primary) hypertension: Secondary | ICD-10-CM | POA: Insufficient documentation

## 2014-06-19 DIAGNOSIS — Z952 Presence of prosthetic heart valve: Secondary | ICD-10-CM | POA: Insufficient documentation

## 2014-06-19 DIAGNOSIS — E785 Hyperlipidemia, unspecified: Secondary | ICD-10-CM | POA: Diagnosis not present

## 2014-06-19 DIAGNOSIS — Z7901 Long term (current) use of anticoagulants: Secondary | ICD-10-CM | POA: Insufficient documentation

## 2014-06-19 DIAGNOSIS — Z888 Allergy status to other drugs, medicaments and biological substances status: Secondary | ICD-10-CM | POA: Diagnosis not present

## 2014-06-19 DIAGNOSIS — I484 Atypical atrial flutter: Secondary | ICD-10-CM

## 2014-06-19 DIAGNOSIS — I099 Rheumatic heart disease, unspecified: Secondary | ICD-10-CM | POA: Diagnosis not present

## 2014-06-19 DIAGNOSIS — I4891 Unspecified atrial fibrillation: Secondary | ICD-10-CM | POA: Diagnosis present

## 2014-06-19 DIAGNOSIS — I4892 Unspecified atrial flutter: Secondary | ICD-10-CM | POA: Insufficient documentation

## 2014-06-19 HISTORY — PX: CARDIOVERSION: SHX1299

## 2014-06-19 LAB — BASIC METABOLIC PANEL
BUN: 21 mg/dL (ref 6–23)
CHLORIDE: 106 meq/L (ref 96–112)
CO2: 27 mEq/L (ref 19–32)
CREATININE: 1.28 mg/dL (ref 0.50–1.35)
Calcium: 8.8 mg/dL (ref 8.4–10.5)
Glucose, Bld: 101 mg/dL — ABNORMAL HIGH (ref 70–99)
Potassium: 4.3 mEq/L (ref 3.5–5.3)
Sodium: 141 mEq/L (ref 135–145)

## 2014-06-19 LAB — CBC WITH DIFFERENTIAL/PLATELET
BASOS ABS: 0 10*3/uL (ref 0.0–0.1)
Basophils Relative: 0 % (ref 0–1)
EOS PCT: 2 % (ref 0–5)
Eosinophils Absolute: 0.2 10*3/uL (ref 0.0–0.7)
HEMATOCRIT: 40.7 % (ref 39.0–52.0)
HEMOGLOBIN: 13.3 g/dL (ref 13.0–17.0)
LYMPHS ABS: 2.2 10*3/uL (ref 0.7–4.0)
Lymphocytes Relative: 23 % (ref 12–46)
MCH: 30.8 pg (ref 26.0–34.0)
MCHC: 32.7 g/dL (ref 30.0–36.0)
MCV: 94.2 fL (ref 78.0–100.0)
MONO ABS: 1.1 10*3/uL — AB (ref 0.1–1.0)
Monocytes Relative: 12 % (ref 3–12)
Neutro Abs: 6 10*3/uL (ref 1.7–7.7)
Neutrophils Relative %: 63 % (ref 43–77)
Platelets: 241 10*3/uL (ref 150–400)
RBC: 4.32 MIL/uL (ref 4.22–5.81)
RDW: 13.9 % (ref 11.5–15.5)
WBC: 9.5 10*3/uL (ref 4.0–10.5)

## 2014-06-19 SURGERY — CARDIOVERSION
Anesthesia: Monitor Anesthesia Care

## 2014-06-19 MED ORDER — SODIUM CHLORIDE 0.9 % IJ SOLN: 3.0000 mL | INTRAMUSCULAR | Status: DC | PRN

## 2014-06-19 MED ORDER — PROPOFOL 10 MG/ML IV BOLUS
INTRAVENOUS | Status: DC | PRN
  Administered 2014-06-19: 80 mg via INTRAVENOUS

## 2014-06-19 MED ORDER — LIDOCAINE HCL (CARDIAC) 20 MG/ML IV SOLN
INTRAVENOUS | Status: DC | PRN
  Administered 2014-06-19: 80 mg via INTRAVENOUS

## 2014-06-19 MED ORDER — SODIUM CHLORIDE 0.9 % IJ SOLN: 3.0000 mL | Freq: Two times a day (BID) | INTRAMUSCULAR | Status: DC

## 2014-06-19 MED ORDER — SODIUM CHLORIDE 0.9 % IV SOLN
250.0000 mL | INTRAVENOUS | Status: DC
  Administered 2014-06-19: 250 mL via INTRAVENOUS

## 2014-06-19 NOTE — Discharge Instructions (Signed)
Electrical Cardioversion, Care After °Refer to this sheet in the next few weeks. These instructions provide you with information on caring for yourself after your procedure. Your health care provider may also give you more specific instructions. Your treatment has been planned according to current medical practices, but problems sometimes occur. Call your health care provider if you have any problems or questions after your procedure. °WHAT TO EXPECT AFTER THE PROCEDURE °After your procedure, it is typical to have the following sensations: °· Some redness on the skin where the shocks were delivered. If this is tender, a sunburn lotion or hydrocortisone cream may help. °· Possible return of an abnormal heart rhythm within hours or days after the procedure. °HOME CARE INSTRUCTIONS °· Take medicines only as directed by your health care provider. Be sure you understand how and when to take your medicine. °· Learn how to feel your pulse and check it often. °· Limit your activity for 48 hours after the procedure or as directed by your health care provider. °· Avoid or minimize caffeine and other stimulants as directed by your health care provider. °SEEK MEDICAL CARE IF: °· You feel like your heart is beating too fast or your pulse is not regular. °· You have any questions about your medicines. °· You have bleeding that will not stop. °SEEK IMMEDIATE MEDICAL CARE IF: °· You are dizzy or feel faint. °· It is hard to breathe or you feel short of breath. °· There is a change in discomfort in your chest. °· Your speech is slurred or you have trouble moving an arm or leg on one side of your body. °· You get a serious muscle cramp that does not go away. °· Your fingers or toes turn cold or blue. °Document Released: 12/12/2012 Document Revised: 07/08/2013 Document Reviewed: 12/12/2012 °ExitCare® Patient Information ©2015 [employer]. This information is not intended to replace advice given to you by your health care provider.  Make sure you discuss any questions you have with your health care provider. ° ° °Conscious Sedation, Adult, Care After °Refer to this sheet in the next few weeks. These instructions provide you with information on caring for yourself after your procedure. Your health care provider may also give you more specific instructions. Your treatment has been planned according to current medical practices, but problems sometimes occur. Call your health care provider if you have any problems or questions after your procedure. °WHAT TO EXPECT AFTER THE PROCEDURE  °After your procedure: °· You may feel sleepy, clumsy, and have poor balance for several hours. °· Vomiting may occur if you eat too soon after the procedure. °HOME CARE INSTRUCTIONS °· Do not participate in any activities where you could become injured for at least 24 hours. Do not: °¨ Drive. °¨ Swim. °¨ Ride a bicycle. °¨ Operate heavy machinery. °¨ Cook. °¨ Use power tools. °¨ Climb ladders. °¨ Work from a high place. °· Do not make important decisions or sign legal documents until you are improved. °· If you vomit, drink water, juice, or soup when you can drink without vomiting. Make sure you have little or no nausea before eating solid foods. °· Only take over-the-counter or prescription medicines for pain, discomfort, or fever as directed by your health care provider. °· Make sure you and your family fully understand everything about the medicines given to you, including what side effects may occur. °· You should not drink alcohol, take sleeping pills, or take medicines that cause drowsiness for at least 24   hours. °· If you smoke, do not smoke without supervision. °· If you are feeling better, you may resume normal activities 24 hours after you were sedated. °· Keep all appointments with your health care provider. °SEEK MEDICAL CARE IF: °· Your skin is pale or bluish in color. °· You continue to feel nauseous or vomit. °· Your pain is getting worse and is not  helped by medicine. °· You have bleeding or swelling. °· You are still sleepy or feeling clumsy after 24 hours. °SEEK IMMEDIATE MEDICAL CARE IF: °· You develop a rash. °· You have difficulty breathing. °· You develop any type of allergic problem. °· You have a fever. °MAKE SURE YOU: °· Understand these instructions. °· Will watch your condition. °· Will get help right away if you are not doing well or get worse. °Document Released: 12/12/2012 Document Reviewed: 12/12/2012 °ExitCare® Patient Information ©2015 [employer]. This information is not intended to replace advice given to you by your health care provider. Make sure you discuss any questions you have with your health care provider. ° °

## 2014-06-19 NOTE — Anesthesia Postprocedure Evaluation (Signed)
  Anesthesia Post-op Note  Patient: [name] [name]  Procedure(s) Performed: Procedure(s) (LRB): CARDIOVERSION (N/A)  Patient Location: PACU  Anesthesia Type: MAC  Level of Consciousness: awake and alert   Airway and Oxygen Therapy: Patient Spontanous Breathing  Post-op Pain: mild  Post-op Assessment: Post-op Vital signs reviewed, Patient's Cardiovascular Status Stable, Respiratory Function Stable, Patent Airway and No signs of Nausea or vomiting  Last Vitals:  Filed Vitals:   06/19/14 1050  BP:   Pulse: 56  Temp:   Resp: 17    Post-op Vital Signs: stable   Complications: No apparent anesthesia complications

## 2014-06-19 NOTE — Anesthesia Preprocedure Evaluation (Addendum)
Anesthesia Evaluation  Patient identified by MRN, date of birth, ID band Patient awake    Reviewed: Allergy & Precautions, NPO status , Patient's Chart, lab work & pertinent test results  Airway Mallampati: II  TM Distance: >3 FB Neck ROM: Full    Dental no notable dental hx.    Pulmonary neg pulmonary ROS,  breath sounds clear to auscultation  Pulmonary exam normal       Cardiovascular hypertension, Rhythm:Irregular Rate:Normal  AORTIC AND MITRAL VALVE REPLACEMENT  Study Conclusions  - Left ventricle: The cavity size was normal. Wall thickness was increased in a pattern of moderate LVH. Systolic function was normal. The estimated ejection fraction was in the range of 55% to 60%. - Aortic valve: Normal appearing AV bioprosthesis - Mitral valve: Normal appearing mitral bioprosthetic valve. - Left atrium: The atrium was moderately to severely dilated. - Atrial septum: No defect or patent foramen ovale was identified. - Pulmonary arteries: PA peak pressure: 32 mm Hg (S).    Neuro/Psych CVA, No Residual Symptoms negative psych ROS   GI/Hepatic negative GI ROS, Neg liver ROS,   Endo/Other  negative endocrine ROS  Renal/GU negative Renal ROS  negative genitourinary   Musculoskeletal negative musculoskeletal ROS (+)   Abdominal   Peds negative pediatric ROS (+)  Hematology negative hematology ROS (+)   Anesthesia Other Findings   Reproductive/Obstetrics negative OB ROS                            Anesthesia Physical Anesthesia Plan  ASA: III  Anesthesia Plan: MAC   Post-op Pain Management:    Induction: Intravenous  Airway Management Planned: Mask  Additional Equipment:   Intra-op Plan:   Post-operative Plan:   Informed Consent: I have reviewed the patients History and Physical, chart, labs and discussed the procedure including the risks, benefits and alternatives for the  proposed anesthesia with the patient or authorized representative who has indicated his/her understanding and acceptance.   Dental advisory given  Plan Discussed with: CRNA and Surgeon  Anesthesia Plan Comments:         Anesthesia Quick Evaluation

## 2014-06-19 NOTE — Interval H&P Note (Signed)
History and Physical Interval Note:  06/19/2014 10:24 AM  [name] [name]  has presented today for surgery, with the diagnosis of aflutter  The various methods of treatment have been discussed with the patient and family. After consideration of risks, benefits and other options for treatment, the patient has consented to  Procedure(s): CARDIOVERSION (N/A) as a surgical intervention .  The patient's history has been reviewed, patient examined, no change in status, stable for surgery.  I have reviewed the patient's chart and labs.  Questions were answered to the patient's satisfaction.     Yigit Norkus

## 2014-06-19 NOTE — CV Procedure (Signed)
    Electrical Cardioversion Procedure Note [name] [name] [id number] 08-Aug-1940  Procedure: Electrical Cardioversion Indications:  Atrial Fibrillation  Time Out: Verified patient identification, verified procedure,medications/allergies/relevent history reviewed, required imaging and test results available.  Performed  Procedure Details  The patient was NPO after midnight. Anesthesia was administered at the beside  by Dr.Rose with 80mg  of propofol.  Cardioversion was performed with synchronized biphasic defibrillation via AP pads with 120 joules.  1 attempt(s) were performed.  The patient converted to normal sinus rhythm. The patient tolerated the procedure well   IMPRESSION:  Successful cardioversion of atrial fibrillation    [name] [name] 06/19/2014, 10:42 AM

## 2014-06-19 NOTE — Transfer of Care (Signed)
Immediate Anesthesia Transfer of Care Note  Patient: [name] [name]  Procedure(s) Performed: Procedure(s): CARDIOVERSION (N/A)  Patient Location: Endoscopy Unit  Anesthesia Type:MAC  Level of Consciousness: awake, alert  and oriented  Airway & Oxygen Therapy: Patient Spontanous Breathing and Patient connected to nasal cannula oxygen  Post-op Assessment: Report given to RN and Post -op Vital signs reviewed and stable  Post vital signs: Reviewed and stable  Last Vitals:  Filed Vitals:   06/19/14 0914  BP: 141/92  Pulse: 69  Resp: 21    Complications: No apparent anesthesia complications

## 2014-06-19 NOTE — Anesthesia Postprocedure Evaluation (Signed)
  Anesthesia Post-op Note  Patient: [name] [name]  Procedure(s) Performed: Procedure(s): CARDIOVERSION (N/A)  Patient Location: Endoscopy Unit  Anesthesia Type:MAC  Level of Consciousness: awake, alert  and oriented  Airway and Oxygen Therapy: Patient Spontanous Breathing and Patient connected to nasal cannula oxygen  Post-op Pain: none  Post-op Assessment: Post-op Vital signs reviewed, Patient's Cardiovascular Status Stable, Respiratory Function Stable, Patent Airway and No signs of Nausea or vomiting  Post-op Vital Signs: Reviewed and stable  Last Vitals:  Filed Vitals:   06/19/14 0914  BP: 141/92  Pulse: 69  Resp: 21    Complications: No apparent anesthesia complications

## 2014-06-20 ENCOUNTER — Encounter (HOSPITAL_COMMUNITY): Payer: Self-pay | Admitting: Cardiology

## 2014-06-23 ENCOUNTER — Telehealth: Payer: Self-pay | Admitting: *Deleted

## 2014-06-23 NOTE — Telephone Encounter (Signed)
pt notified about lab results with verbal understanding  

## 2014-06-27 ENCOUNTER — Ambulatory Visit (INDEPENDENT_AMBULATORY_CARE_PROVIDER_SITE_OTHER): Payer: Medicare Other | Admitting: Pharmacist

## 2014-06-27 DIAGNOSIS — Z5181 Encounter for therapeutic drug level monitoring: Secondary | ICD-10-CM

## 2014-06-27 DIAGNOSIS — I4892 Unspecified atrial flutter: Secondary | ICD-10-CM | POA: Diagnosis not present

## 2014-06-27 DIAGNOSIS — I6340 Cerebral infarction due to embolism of unspecified cerebral artery: Secondary | ICD-10-CM | POA: Diagnosis not present

## 2014-06-27 DIAGNOSIS — I4891 Unspecified atrial fibrillation: Secondary | ICD-10-CM | POA: Diagnosis not present

## 2014-06-27 LAB — POCT INR: INR: 3.7

## 2014-07-02 NOTE — Progress Notes (Signed)
Cardiology Office Note   Date:  07/03/2014   ID:  [name], [name] 1940/12/02, MRN [id number]  PCP:  Scarlette Calico, MD  Cardiologist:  Dr. Loralie Champagne   Cardiologist at [facility]:  Dr. Festus Holts Electrophysiologist at [facility]:  Dr. Esperanza Heir   Chief Complaint  Patient presents with  . Atrial Flutter     History of Present Illness: [name] [name] is a 74 y.o. 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 He was started on dronedarone and coumadin with plans for cardioversion after 4 wks of therapeutic anticoagulation if he did not convert back to NSR on his own. He went back into NSR spontaneously. Ultimately came off of coumadin as he had undergone LAA ligation at the time of his surgery.  In 6/15, patient was admitted with transient dysarthria (resolved in about 10 minutes). However, he was found to have evidence for CVA by MRI. Therefore, he was started on coumadin. He was noted to be in atypical atrial flutter again. Ranolazine was added to dronedarone.   Last seen by Dr. [name] 02/2014. He had undergone RFCA for AFlutter at [facility] in 01/2014.  When he saw Dr. [name] he was in NSR.  Plan was to continue Coumadin even though patient had an ablation given his history of stroke. He was continued on Dronedarone and ranolazine.  Of note, thoracic aortic aneurysm is followed at the [facility] clinic.  He was seen in follow-up at the [facility] in February. His cardiologist there felt that his Ranexa could be stopped as he remained in sinus rhythm after his ablation. Follow-up CT demonstrated stable aortic root aneurysmal dilatation ascending aorta at 5 cm.  Echocardiogram demonstrated normal LV function with stable mitral valve and aortic valve gradients.  I saw him last  week. He was in atrial flutter. Heart rate was controlled. I discussed his case with Dr. [name]. We elected to place him back on Ranexa. He was brought back for follow-up and remained in AFlutter.  We arranged DCCV for 06/19/14.  He returns for FU.  He remains in sinus rhythm. He is feeling well. The patient denies any chest pain, significant dyspnea, syncope, orthopnea, PND, edema.   Studies/Reports Reviewed Today:  Chest CT 05/01/14 (at [facility]) IMPRESSION: STABLE INTERVAL EXAM. 1. 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 The dimensions are stable on direct comparison the prior study. 2. Bioprosthetic aortic valve and mitral valve replacements, which appear well-positioned. 3. Normal pulmonary venous anatomy without pulmonary vein stenosis. 4. No left atrial or left atrial appendage thrombus.  5. Stable 3 mm nonspecific, noncalcified nodule in the left upper lobe. Based on current guidelines*, a follow-up unenhanced low-dose CT can be obtained in 12 months or at clinical discretion.  Echo 05/01/14 (at [facility]) - The left ventricle is normal in size. There is mild left ventricular hypertrophy. Left ventricular systolic function is normal. EF = 55  5% (2D biplane) Baseline left ventricular diastolic function was not evaluated due to prosthetic valve. - The right ventricle is normal in size. Right ventricular systolic function is normal. - The left atrial cavity is dilated. 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 Post mitral valve replacement. There is  trivial mitral valve regurgitation. The peak gradient is 20 mmHg and the mean gradient is 4 mmHg. - Carpentier-Edwards prosthetic aortic valve (size #27). There is trivial aortic valve regurgitation. The peak gradient is 16 mmHg and the mean gradient is 7 mmHg. - The visualized aorta is dilated. Measurements - Sinus 4.6 cm.  Mid ascending aorta 4.6 cm. - Compared with the prior CC echocardiographic exam performed on 11/26/2013. Similar findings.  Echo 08/2013 - Moderate LVH. EF 55%-60%. - Aortic valve: Normal appearing AV bioprosthesis - Mitral valve: Normal appearing mitral bioprosthetic valve. - Left atrium: The atrium was moderately to severely dilated. - Atrial septum: No defect or patent foramen ovale was identified. - Pulmonary arteries: PA peak pressure: 32 mm Hg (S).  Carotid US 6/15 Bateral - 1% - 39% ICA stenosis.  Past Medical History: 1. Atrial fibrillation: s/p MAZE and LAA ligation in 2006. No documented atrial fibrillation since operation.  2. Atypical atrial flutter: 10/13, 3/15, and again in 6/15.  3. 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  - Echo 5/11 with EF 60-65%, normal bioprosthetic aortic valve, normal bioprosthetic mitral valve, moderate LAE.  - Echo 11/12 at [facility] looked ok per report.  - Echo (10/13) with EF 55-60%, mild LVH, normal bioprosthetic aortic and mitral valves, moderate LAE, PA systolic pressure 35 mmHg.  - Echo (6/15) with EF 60%, moderate LVH, normal-appearing bioprosthetic MV and AoV, PA systolic pressure 32 mmHg.  - Echo 9/15 [facility]) with EF 55%, mild LVH, PASP 52 mmHg, severe LAE, bioprosthetic mitral valve with no MR and mean gradient 11 mmHg, moderate TR, bioprosthetic aortic valve with no AI and mean gradient 9 mmHg, dilated ascending aorta to 5 cm.  4. Hyperlipidemia 5. L-spine surgery 6. LHC with no significant coronary disease in 2006 pre-op.  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 CTA chest 9/15 with dilated sinuses of valsalva to 5.0 cm and ascending aorta 4.9 cm.  Past Medical History  Diagnosis Date  . Psychosexual dysfunction with inhibited sexual excitement    . Other and unspecified hyperlipidemia   . 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 Lesion of ulnar nerve   . External hemorrhoids without mention of complication   . Acute rheumatic pericarditis   . Acute rheumatic heart disease, unspecified     childhood, age  80 & 86  . 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 Atrial fibrillation     history  . Diverticulosis     Past Surgical History  Procedure Laterality Date  . Laminectomies      10/1975 and in 03/2005  . 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 Cardioversion      3 times from 2004-2006  . Tonsillectomy      1950  . Colonoscopy    . Cardioversion N/A 09/26/2013    Procedure: CARDIOVERSION;  Surgeon: Larey Dresser, MD;  Location: [facility];  Service: Cardiovascular;  Laterality: N/A;  . Cardioversion N/A 06/19/2014    Procedure: CARDIOVERSION;  Surgeon: Jerline Pain, MD;  Location: [facility] ENDOSCOPY;  Service: Cardiovascular;  Laterality: N/A;     Current Outpatient Prescriptions  Medication Sig Dispense Refill  . atorvastatin (LIPITOR) 80 MG tablet Take 1 tablet by mouth  daily 90 tablet 1  . clobetasol cream (  TEMOVATE) 0.05 % as needed (skin irritation).     . Coenzyme Q10-Vitamin E 100-300 MG-UNIT WAFR Take by mouth daily.    . Cyanocobalamin (VITAMIN B 12 PO) Take 1 tablet by mouth daily.    [name] dronedarone (MULTAQ) 400 MG tablet Take 1 tablet (400 mg total) by mouth 2 (two) times daily with a meal. 60 tablet 6  . fish oil-omega-3 fatty acids 1000 MG capsule Take 3 g by mouth daily.    . fluocinonide cream (LIDEX) 0.05 % Apply topically as needed (FOR ITCHING).     . hydrocortisone cream (RA ANTI-ITCH MAXIMUM STRENGTH) 1 % Apply 1 application topically as needed for itching.     . LevOCARNitine (CARNITINE PO) Take 750 mg by mouth daily.    [name] loratadine (CLARITIN) 10 MG tablet Take 10 mg by mouth as needed for allergies.    . Magnesium 400 MG TABS Take 400 mg by mouth daily.    .  metoprolol succinate (TOPROL-XL) 50 MG 24 hr tablet Take 1 tablet (50 mg total) by mouth 2 (two) times daily at 8 am and 10 pm. 180 tablet 2  . multivitamin (THERAGRAN) per tablet Take 1 tablet by mouth daily.    . Probiotic Product (PROBIOTIC FORMULA PO) Take 2 capsules by mouth daily.     . ranolazine (RANEXA) 500 MG 12 hr tablet Take 1 tablet (500 mg total) by mouth 2 (two) times daily. 180 tablet 3  . saw palmetto 80 MG capsule Take 80 mg by mouth 2 (two) times daily.    . sildenafil (VIAGRA) 100 MG tablet Take 1 tablet (100 mg total) by mouth as needed. (Patient taking differently: Take 100 mg by mouth as needed for erectile dysfunction. ) 10 tablet 6  . warfarin (COUMADIN) 5 MG tablet Take as directed by coumadin clinic 120 tablet 1   No current facility-administered medications for this visit.    Allergies:   Naproxen    Social History:  The patient  reports that he has never smoked. He has never used smokeless tobacco. He reports that he drinks alcohol. He reports that he does not use illicit drugs.   Family History:  The patient's family history includes Cancer in his father; Diabetes in his brother and paternal aunt; Heart attack in his maternal grandfather; Macular degeneration in his mother; Stroke in his mother.    ROS:   Please see the history of present illness.   Review of Systems  Respiratory: Positive for snoring.   All other systems reviewed and are negative.    PHYSICAL EXAM: VS:  BP 120/80 mmHg  Pulse 60  Ht 5\' 8"  (1.727 m)  Wt 203 lb 6.4 oz (92.262 kg)  BMI 30.93 kg/m2    Wt Readings from Last 3 Encounters:  07/03/14 203 lb 6.4 oz (92.262 kg)  06/18/14 214 lb (97.07 kg)  06/10/14 208 lb (94.348 kg)     GEN: Well nourished, well developed, in no acute distress HEENT: normal Neck: no JVD, no masses Cardiac:  Normal S1/S2, RRR; no murmur ,  no rubs or gallops, no edema  Respiratory:  clear to auscultation bilaterally, no wheezing, rhonchi or rales. GI:  soft, nontender, nondistended, + BS MS: no deformity or atrophy Skin: warm and dry  Neuro:  CNs II-XII intact, Strength and sensation are intact Psych: Normal affect   EKG:  EKG is ordered today.  It demonstrates:   NSR, HR 60, Normal axis, 1st degree AVB, PR 278, no change from  prior tracings.   Recent Labs: 11/26/2013: Magnesium 2.2; TSH 3.060 02/07/2014: ALT 45 06/18/2014: BUN 21; Creatinine 1.28; Hemoglobin 13.3; Platelets 241; Potassium 4.3; Sodium 141    Lipid Panel    Component Value Date/Time   CHOL 102 11/26/2013   CHOL 114 08/06/2013 0647   TRIG 93 11/26/2013   TRIG 62 08/06/2013 0647   HDL 25* 11/26/2013   CHOLHDL 2.4 08/06/2013 0647   VLDL 19 11/26/2013   LDLCALC 58 11/26/2013   LDLCALC 54 08/06/2013 0647     Lab Results  Component Value Date   INR 3.7 06/27/2014   INR 3.2 06/18/2014   INR 2.7 06/06/2014    ASSESSMENT AND PLAN:  Atypical atrial flutter Maintaining NSR after recent DCCV.  He is maintained on Ranolazine and Dronedarone.  Coumadin is managed in our CVRR clinic.    Rheumatic heart disease S/P MVR (mitral valve replacement) and S/P AVR (aortic valve replacement) Recent echocardiogram at the [facility] demonstrates stable aortic and mitral valve gradients. Continue SBE prophylaxis.     Essential hypertension Controlled.     Hyperlipidemia Continue statin.   LDL optimal in 9/15.  History of CVA (cerebrovascular accident) Continue Coumadin, statin.     Thoracic ascending aortic aneurysm  This is followed by CT [facility] at the [facility]. Recent measurements have been stable.      Current medicines are reviewed at length with the patient today.  The patient does not have concerns regarding medicines.  The following changes have been made:    None     Labs/ tests ordered today include:   Orders Placed This Encounter  Procedures  . EKG 12-Lead    Disposition:   FU Dr. Loralie Champagne next month as planned.     Signed, Versie Starks, MHS 07/03/2014 12:39 PM    [facility] [place], [place], [place]  [address] Phone: [phone]; Fax: [phone]

## 2014-07-03 ENCOUNTER — Ambulatory Visit (INDEPENDENT_AMBULATORY_CARE_PROVIDER_SITE_OTHER): Payer: Medicare Other | Admitting: Physician Assistant

## 2014-07-03 ENCOUNTER — Encounter: Payer: Self-pay | Admitting: Physician Assistant

## 2014-07-03 VITALS — BP 120/80 | HR 60 | Ht 68.0 in | Wt 203.4 lb

## 2014-07-03 DIAGNOSIS — E785 Hyperlipidemia, unspecified: Secondary | ICD-10-CM

## 2014-07-03 DIAGNOSIS — Z954 Presence of other heart-valve replacement: Secondary | ICD-10-CM | POA: Diagnosis not present

## 2014-07-03 DIAGNOSIS — I6340 Cerebral infarction due to embolism of unspecified cerebral artery: Secondary | ICD-10-CM | POA: Diagnosis not present

## 2014-07-03 DIAGNOSIS — I4892 Unspecified atrial flutter: Secondary | ICD-10-CM

## 2014-07-03 DIAGNOSIS — I099 Rheumatic heart disease, unspecified: Secondary | ICD-10-CM

## 2014-07-03 DIAGNOSIS — Z952 Presence of prosthetic heart valve: Secondary | ICD-10-CM

## 2014-07-03 DIAGNOSIS — I10 Essential (primary) hypertension: Secondary | ICD-10-CM

## 2014-07-03 DIAGNOSIS — I712 Thoracic aortic aneurysm, without rupture: Secondary | ICD-10-CM

## 2014-07-03 DIAGNOSIS — I7121 Aneurysm of the ascending aorta, without rupture: Secondary | ICD-10-CM

## 2014-07-03 MED ORDER — RANOLAZINE ER 500 MG PO TB12: 500.0000 mg | ORAL_TABLET | Freq: Two times a day (BID) | ORAL | Status: DC

## 2014-07-03 NOTE — Patient Instructions (Addendum)
Medication Instructions:  No changes.  Labwork: None   Testing/Procedures: None   Follow-Up: Dr. Loralie Champagne as scheduled 07/31/14 at 3 pm.  Any Other Special Instructions Will Be Listed Below (If Applicable). None

## 2014-07-04 ENCOUNTER — Other Ambulatory Visit: Payer: Self-pay

## 2014-07-04 DIAGNOSIS — I4892 Unspecified atrial flutter: Secondary | ICD-10-CM

## 2014-07-04 MED ORDER — RANOLAZINE ER 500 MG PO TB12: 500.0000 mg | ORAL_TABLET | Freq: Two times a day (BID) | ORAL | Status: DC

## 2014-07-09 DIAGNOSIS — R972 Elevated prostate specific antigen [PSA]: Secondary | ICD-10-CM | POA: Diagnosis not present

## 2014-07-10 ENCOUNTER — Other Ambulatory Visit: Payer: Self-pay | Admitting: *Deleted

## 2014-07-10 DIAGNOSIS — I4892 Unspecified atrial flutter: Secondary | ICD-10-CM

## 2014-07-10 MED ORDER — RANOLAZINE ER 500 MG PO TB12
500.0000 mg | ORAL_TABLET | Freq: Two times a day (BID) | ORAL | Status: DC
Start: 2014-07-10 — End: 2015-07-08

## 2014-07-17 ENCOUNTER — Ambulatory Visit (INDEPENDENT_AMBULATORY_CARE_PROVIDER_SITE_OTHER): Payer: Medicare Other

## 2014-07-17 DIAGNOSIS — Z5181 Encounter for therapeutic drug level monitoring: Secondary | ICD-10-CM | POA: Diagnosis not present

## 2014-07-17 DIAGNOSIS — I4892 Unspecified atrial flutter: Secondary | ICD-10-CM | POA: Diagnosis not present

## 2014-07-17 DIAGNOSIS — I4891 Unspecified atrial fibrillation: Secondary | ICD-10-CM | POA: Diagnosis not present

## 2014-07-17 DIAGNOSIS — I6340 Cerebral infarction due to embolism of unspecified cerebral artery: Secondary | ICD-10-CM

## 2014-07-17 LAB — POCT INR: INR: 4.4

## 2014-07-21 ENCOUNTER — Encounter: Payer: Self-pay | Admitting: Neurology

## 2014-07-21 ENCOUNTER — Ambulatory Visit (INDEPENDENT_AMBULATORY_CARE_PROVIDER_SITE_OTHER): Payer: Medicare Other | Admitting: Neurology

## 2014-07-21 VITALS — BP 123/83 | HR 62 | Wt 200.2 lb

## 2014-07-21 DIAGNOSIS — I10 Essential (primary) hypertension: Secondary | ICD-10-CM

## 2014-07-21 DIAGNOSIS — I482 Chronic atrial fibrillation, unspecified: Secondary | ICD-10-CM

## 2014-07-21 DIAGNOSIS — I6340 Cerebral infarction due to embolism of unspecified cerebral artery: Secondary | ICD-10-CM | POA: Diagnosis not present

## 2014-07-21 DIAGNOSIS — R972 Elevated prostate specific antigen [PSA]: Secondary | ICD-10-CM | POA: Diagnosis not present

## 2014-07-21 NOTE — Patient Instructions (Signed)
-   continue coumadin and and lipitor for stroke prevention - goal INR 2-3.  - follow up with cardiologist as scheduled. - Follow up with your primary care physician for stroke risk factor modification. Recommend maintain blood pressure goal <130/80, diabetes with hemoglobin A1c goal below 6.5% and lipids with LDL cholesterol goal below 70 mg/dL.  - check BP at home - follow up in 6 months.

## 2014-07-22 NOTE — Progress Notes (Signed)
STROKE NEUROLOGY FOLLOW UP NOTE  NAME: [name] [name] DOB: [birth year]  REASON FOR VISIT: stroke follow up HISTORY FROM: pt and chart  Today we had the pleasure of seeing [name] [name] in follow-up at our Neurology Clinic. Pt was accompanied by no one.   History Summary [name] [name] is a 74 y.o. 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 He was off coumadin for a while as per cardiology before the admission on 08/05/13 due to acute onset expressive aphasia, right facial droop and right-sided numbness. This was completely resolved before ER arrival. Patient was not administered TPA secondary to symptoms resolved. In ER, he still showing in A flutter rhythm. After admission, MRI showed acute left frontal stroke but also with subacute left parietal infarct, consistent with embolic stroke. Cardiology was consulted and agreed with anticoagulation with coumadin. He was discharged with coumadin.   Follow up 10/14/13 - the patient has been doing well. He stated that he had cardioversion with [facility] 3 weeks ago and for a week his heart rhythm is in sinus but for the last two weeks he was still in Afib or Aflutter. He is going to [facility] this week to see if he is a candidate for ablation to treat his heart rhythm but he is aware that he needs to take anticoagulation likely for life. He followed up with INR check and his INR was in good range between 2-3.  Follow up 01/20/14 - pt was doing well. He had ablation procedure done in [facility] last in September. It was successful and he did not have any symptoms of palpitation or CP. His INR check 4 days ago was 2.2. His is compliant with coumadin. BP stable and today 140/82. He said his BP at home around 120s. He has appointment follow up with his cardiologist Dr. [name] next  month and also with his cardiologist in [place] clinic.  Interval History During the interval time, pt has been doing well. He had another ablation procedure done during the interval time and currently feels good and no palpitations. BP 123/83, in good control. Last INR 4.4 about 4 days ago.   REVIEW OF SYSTEMS: Full 14 system review of systems performed and notable only for those listed below and in HPI above, all others are negative:  Constitutional: N/A  Cardiovascular: palpitation  Ear/Nose/Throat: N/A  Skin: rash  Eyes: N/A  Respiratory: SOB  Gastroitestinal: N/A  Genitourinary: N/A Hematology/Lymphatic: N/A  Endocrine: N/A  Musculoskeletal: N/A  Allergy/Immunology: N/A  Neurological: N/A  Psychiatric: N/A Sleep: snoring  The following represents the patient's updated allergies and side effects list: Allergies  Allergen Reactions  . Naproxen     REACTION: Hives    Labs since last visit of relevance include the following: Results for orders placed or performed in visit on 07/17/14  POCT INR  Result Value Ref Range   INR 4.4     The neurologically relevant items on the patient's problem list were reviewed on today's visit.  Neurologic Examination  A problem focused neurological exam (12 or more points of the single system neurologic examination, vital signs counts as 1 point, cranial nerves count for 8 points) was performed.  Blood pressure 123/83, pulse 62, weight 200 lb 3.2 oz (90.81 kg).  General - Well nourished, well developed, in no apparent distress.  Ophthalmologic - Sharp disc margins OU.  Cardiovascular - regular heart rhythm.  Mental Status -  Level of arousal and orientation to time, place, and person were intact. Language including expression, naming, repetition, comprehension, reading, and writing was assessed and found intact.  Cranial Nerves II - XII -  II - Vision intact OU. III, IV, VI - Extraocular movements intact. V - Facial sensation  intact bilaterally. VII - Facial movement intact bilaterally. VIII - Hearing & vestibular intact bilaterally. X - Palate elevates symmetrically. XI - Chin turning & shoulder shrug intact bilaterally. XII - Tongue protrusion intact.  Motor Strength - The patient's strength was normal in all extremities and pronator drift was absent.  Bulk was normal and fasciculations were absent.   Motor Tone - Muscle tone was assessed at the neck and appendages and was normal.  Reflexes - The patient's reflexes were normal in all extremities and he had no pathological reflexes.  Sensory - Light touch, temperature/pinprick, vibration and proprioception, and Romberg testing were assessed and were normal.    Coordination - The patient had normal movements in the hands and feet with no ataxia or dysmetria.  Tremor was absent.  Gait and Station - The patient's transfers, posture, gait, station, and turns were observed as normal.  Data reviewed: I personally reviewed the images and agree with the radiology interpretations.  CT of the brain 08/05/2013 1. 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 There is no evidence of an acute infarct. 2. Mild atrophy and chronic microvascular ischemic change. 3. No other abnormalities.  MRI of the brain 08/06/2013 Scattered small acute nonhemorrhagic left frontal lobe infarct. Subacute left parietal lobe infarct with laminar necrosis (less likely gyriform petechial hemorrhage). Tiny punctate region of blood breakdown products posterior left temporal lobe may reflect result of hemorrhage ischemia without intracranial hemorrhage otherwise noted. Mild to moderate small vessel disease type changes. Mild gyriform enhancement of the subacute left parietal lobe infarct otherwise no evidence of intracranial enhancing lesion or mass identified. Mild mucosal thickening inferior aspect right maxillary sinus. Transverse ligament hypertrophy with minimal anterior slippage of the C1  ring and mild posterior displacement of the dens contributing to moderate spinal stenosis and cord flattening greater on the left.  MRA of the brain 08/06/2013 Small caliber A2 segment of the left anterior cerebral artery with majority of the A2 segment supplied from the right. Anterior circulation without medium or large size vessel significant stenosis or occlusion. Mild middle cerebral artery branch vessel irregularity greater on the left. Right posterior inferior cerebellar artery and anterior inferior cerebellar artery have a single origin from the proximal basilar artery. Similar appearance on the left slightly distal to this level. No significant stenosis of the distal vertebral arteries or basilar artery. Minimal irregularity distal posterior cerebral artery branch vessels.  MRA of the neck 08/06/2013 Ectatic 3 vessel aortic arch. Ectasia great vessels. Mild narrowing proximal right subclavian artery. Minimal narrowing proximal right vertebral artery. Mild ectasia proximal left vertebral artery. No significant stenosis of the vertebral arteries. No significant stenosis of either carotid artery. Prominent and ectasia of the vertical segment of the internal carotid arteries bilaterally.  2D Echocardiogram EF 55-60% with no source of embolus.  Carotid Doppler No evidence of hemodynamically significant internal carotid artery stenosis. Vertebral artery flow is antegrade.  CXR 08/06/2013 No acute cardiopulmonary disease.   Assessment: As you may recall, he is a 74 y.o. Caucasian male with a diagnosis of stroke followed up in clinic today. He presented  to [facility]13 due to aphasia, right facial droop and right arm numbness. MRI showed acute left frontal stroke with subacute left parietal stroke, consistent with embolic stroke. Given his hx of rheumatic fever with mitral and aortic valve abnormality s/p valvular replacement and on afib with discontinued coumadin by cardiology, his stroke was high likely due to  afib not on anticoagulation. Discussed with cardiology, he was put on coumadin. After discharge, he has been follow up with coumadin clinic and INR is on the goal between 2-3. He had cardioversion 3 weeks ago but currently he still in afib. He had ablation procedure in September and now he is in NSR. He continued on coumadin and INR last check 2.2.    Plan:  - continue coumadin and lipitor for stroke prevention - goal INR 2-3.  - follow up with cardiologist as scheduled. - Follow up with your primary care physician for stroke risk factor modification. Recommend maintain blood pressure goal <130/80, diabetes with hemoglobin A1c goal below 6.5% and lipids with LDL cholesterol goal below 70 mg/dL.  - check BP at home - RTC in 6 months.  Diagnoses from this visit: No diagnosis found.   Patient Instructions  - continue coumadin and and lipitor for stroke prevention - goal INR 2-3.  - follow up with cardiologist as scheduled. - Follow up with your primary care physician for stroke risk factor modification. Recommend maintain blood pressure goal <130/80, diabetes with hemoglobin A1c goal below 6.5% and lipids with LDL cholesterol goal below 70 mg/dL.  - check BP at home - follow up in 6 months.   Rosalin Hawking, MD PhD [facility] [address], [place], [place] [address] [phone]

## 2014-07-31 ENCOUNTER — Ambulatory Visit (INDEPENDENT_AMBULATORY_CARE_PROVIDER_SITE_OTHER): Payer: Medicare Other | Admitting: Cardiology

## 2014-07-31 ENCOUNTER — Encounter: Payer: Self-pay | Admitting: Cardiology

## 2014-07-31 ENCOUNTER — Ambulatory Visit (INDEPENDENT_AMBULATORY_CARE_PROVIDER_SITE_OTHER): Payer: Medicare Other

## 2014-07-31 VITALS — BP 122/60 | HR 63 | Ht 68.0 in | Wt 195.0 lb

## 2014-07-31 DIAGNOSIS — I10 Essential (primary) hypertension: Secondary | ICD-10-CM | POA: Diagnosis not present

## 2014-07-31 DIAGNOSIS — I4891 Unspecified atrial fibrillation: Secondary | ICD-10-CM

## 2014-07-31 DIAGNOSIS — I482 Chronic atrial fibrillation, unspecified: Secondary | ICD-10-CM

## 2014-07-31 DIAGNOSIS — I6340 Cerebral infarction due to embolism of unspecified cerebral artery: Secondary | ICD-10-CM

## 2014-07-31 DIAGNOSIS — I484 Atypical atrial flutter: Secondary | ICD-10-CM

## 2014-07-31 DIAGNOSIS — E785 Hyperlipidemia, unspecified: Secondary | ICD-10-CM | POA: Diagnosis not present

## 2014-07-31 DIAGNOSIS — Z5181 Encounter for therapeutic drug level monitoring: Secondary | ICD-10-CM | POA: Diagnosis not present

## 2014-07-31 DIAGNOSIS — I4892 Unspecified atrial flutter: Secondary | ICD-10-CM

## 2014-07-31 LAB — POCT INR: INR: 3.5

## 2014-07-31 NOTE — Progress Notes (Signed)
Patient ID: [name] [name], male   DOB: 26-Aug-1940, 74 y.o.   MRN: [id number] PCP: Dr. Maudie Mercury  74 yo with history of rheumatic fever and AI/[name] s/p bioprosthetic AVR and MVR at the [facility] as well as MAZE procedure all in 2006 presents for cardiology evaluation.  He also has an ascending aortic aneurysm.   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  I started him on dronedarone and coumadin with plan for cardioversion after 4 wks of therapeutic anticoagulation if he did not convert back to NSR on his own.  He went back into NSR spontaneously and later stopped coumadin. Atrial flutter recurred in 3/15 and again resolved spontaneously.  In 6/15, patient was admitted with transient dysarthria (resolved in about 10 minutes).  However, he was found to have evidence for CVA by MRI.  Therefore, he was started on coumadin. He was noted to be in atypical atrial flutter again.  Ranolazine was added to dronedarone.    In 8/15, [name] [name] was seen in consultation at the [name].  He had atrial flutter ablation at [facility] in 11/15.    He was seen in followup at [facility] in 2/16.  Ranolazine was stopped.  Echo in 2/16 showed EF 55% with stable bioprosthetic mitral and aortic valves.  CTA chest showed stable 5 cm ascending aortic aneurysm.  He went back into atypical atrial flutter in 4/16.  Ranolazine was restarted and he was cardioverted back to NSR.    Today, patient remains in NSR.  He has had no further neurological symptoms.  No exertional dyspnea or chest pain.  He is working out at [employer].  No lightheadedness or syncope.  He generally feels good. Weight is down 8 lbs.   ECG (12/12/11): Atypical atrial flutter versus atrial tachycardia with rate around 100 bpm, QTc normal.  ECG (12/29/11): NSR, 1st degree AV block PR 242 msec.    ECG (12/13): NSR, 1st degree AV block PR 244 msec    ECG (10/14): NSR, 1st degree AV block ECG (3/15):  Atypical atrial flutter with rate 87, inferior T wave inversions ECG (6/15): Atypical atrial flutter with rate 77 ECG (8/15): atypical atrial flutter rate 82 ECG (10/15): NSR, 1st degree AV block ECG (12/15): NSR, 1st degree AV block, QTc 410 msec ECG (5/16): NSR, 1st degree AV block, QTc 419 msec  Labs (3/14): LDL 64, TSH normal, K 3.9, creatinine 1.0     Labs (6/15): LDL 54, HDL 48, creatinine 0.95   Labs (9/15): LDL 54, HDL 25 Labs (10/15): K 4.5, creatinine 1.3, AST 45, ALT 64 Labs (4/16): K 4.3, creatinine 1.28, HCT 40.7                                                                                                                                 PMH: 1. Atrial fibrillation: s/p MAZE and LAA ligation in  2006.  No documented atrial fibrillation since operation.  2. Atypical atrial flutter: 10/13, 3/15, and again in 6/15.  Recurrent 4/16 with DCCV.  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   - Echo 5/11 with EF 60-65%, normal bioprosthetic aortic valve, normal bioprosthetic mitral valve, moderate LAE.   - Echo 11/12 at [facility] looked ok per report.  - Echo (10/13) with EF 55-60%, mild LVH, normal bioprosthetic aortic and mitral valves, moderate LAE, PA systolic pressure 35 mmHg.   - Echo (6/15) with EF 60%, moderate LVH, normal-appearing bioprosthetic MV and AoV, PA systolic pressure 32 mmHg.   - Echo 9/15 [facility]) with EF 55%, mild LVH, PASP 52 mmHg, severe LAE, bioprosthetic mitral valve with no [name] and mean gradient 11 mmHg, moderate TR, bioprosthetic aortic valve with no AI and mean gradient 9 mmHg, dilated ascending aorta to 5 cm.   - Echo (2/16, [facility]) with EF 55%, stable bioprosthetic aortic and mitral valves (AoV mean gradient 7 mmHg, MV mean gradient 4 mmHg), normal RV size and systolic function.  4. Hyperlipidemia 5. L-spine surgery 6. LHC with no significant coronary disease in 2006  pre-op.  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  CTA chest 9/15 with dilated sinuses of valsalva to 5.0 cm and ascending aorta 4.9 cm. CTA chest (2/16) with 5.0 cm ascending aorta.   SH: Married, retired Forensic psychologist [name] [name], [name] [name], [name] [name]).  2 sons.  Never smoked.   FH: No premature CAD.   ROS: All systems reviewed and negative except as per HPI.  Current Outpatient Prescriptions  Medication Sig Dispense Refill  . atorvastatin (LIPITOR) 80 MG tablet Take 1 tablet by mouth  daily 90 tablet 1  . clobetasol cream (TEMOVATE) 0.05 % as needed (skin irritation).     . Coenzyme Q10-Vitamin E 100-300 MG-UNIT WAFR Take by mouth daily.    . Cyanocobalamin (VITAMIN B 12 PO) Take 1 tablet by mouth daily.    [name] dronedarone (MULTAQ) 400 MG tablet Take 1 tablet (400 mg total) by mouth 2 (two) times daily with a meal. 60 tablet 6  . fish oil-omega-3 fatty acids 1000 MG capsule Take 3 g by mouth daily.    . fluocinonide cream (LIDEX) 0.05 % Apply topically as needed (FOR ITCHING).     . hydrocortisone cream (RA ANTI-ITCH MAXIMUM STRENGTH) 1 % Apply 1 application topically as needed for itching.     . LevOCARNitine (CARNITINE PO) Take 750 mg by mouth daily.    [name] loratadine (CLARITIN) 10 MG tablet Take 10 mg by mouth as needed for allergies.    . Magnesium 400 MG TABS Take 400 mg by mouth daily.    . metoprolol succinate (TOPROL-XL) 50 MG 24 hr tablet Take 1 tablet (50 mg total) by mouth 2 (two) times daily at 8 am and 10 pm. 180 tablet 2  . multivitamin (THERAGRAN) per tablet Take 1 tablet by mouth daily.    . Probiotic Product (PROBIOTIC FORMULA PO) Take 2 capsules by mouth daily.     . ranolazine (RANEXA) 500 MG 12 hr tablet Take 1 tablet (500 mg total) by mouth 2 (two) times daily. 180 tablet 3  . saw palmetto 80 MG capsule Take 80 mg by mouth 2 (two) times daily.    . sildenafil (VIAGRA) 100 MG tablet Take  1 tablet (100 mg total) by mouth  as needed. (Patient taking differently: Take 100 mg by mouth as needed for erectile dysfunction. ) 10 tablet 6  . warfarin (COUMADIN) 5 MG tablet Take as directed by coumadin clinic 120 tablet 1   No current facility-administered medications for this visit.    BP 122/60 mmHg  Pulse 63  Ht 5\' 8"  (1.727 m)  Wt 195 lb (88.451 kg)  BMI 29.66 kg/m2 General: NAD Neck: JVP 7 cm, no thyromegaly or thyroid nodule.  Lungs: Clear to auscultation bilaterally with normal respiratory effort. CV: Nondisplaced PMI.  Heart regular S1/S2, no S3/S4, 2/6 early SEM RUSB.  No peripheral edema.  No carotid bruit.  Normal pedal pulses.  Abdomen: Soft, nontender, no hepatosplenomegaly, no distention.  Neurologic: Alert and oriented x 3.  Psych: Normal affect. Extremities: No clubbing or cyanosis.   Assessment/Plan: 1. Atypical atrial flutter: Patient had a MAZE procedure with LAA ligation at the time of his valve replacement.  Since then, he has had occasional episodes of atypical atrial flutter. He had episodes in 10/13 and 3/15 that converted back to NSR on dronedarone. He was noted to be back in atrial flutter in 6/15 in the setting of a CVA.  He had ablation at [facility] in 11/15.  Atrial flutter recurred after ranolazine was stopped and he had DCCV again in 4/16.  He remains in NSR today. - Continue coumadin (not using NOAC due to valvular atrial fibrillation, do not have good data). This will need to be continued long term even with ablation given CVA.  - Continue current Toprol XL. - He will continue dronedarone and ranolazine to maintain NSR.  QTc interval ok today.  He had recurrent atrial flutter when he tried stopping ranolazine.   2. 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  Valve looked stable on 2/16 echo at [facility]. 3. HTN: BP is reasonably controlled.  4. Hyperlipidemia: Good lipids in 9/15 on atorvastatin.   5. CVA:  Suspect this was related to atrial flutter (despite LA appendage ligation).  He is now on coumadin. No residual neurologic deficits.  Continue long-term. 6. Thoracic aortic aneurysm: Dilated aortic root at sinuses of valsalva and ascending aorta to 5.0 cm, last CTA in 2/16.   - Continue Toprol XL given some evidence that beta blockers may attenuate thoracic aortic aneurysm growth.  - Repeat CTA chest will need to be done in 2/17.   Followup with me in 6 months.    Loralie Champagne 07/31/2014

## 2014-07-31 NOTE — Patient Instructions (Signed)
Medication Instructions:  No changes today  Labwork: None today  Testing/Procedures: None today  Follow-Up: Your physician wants you to follow-up in: 6 months with Dr [name]. (November 2016).  You will receive a reminder letter in the mail two months in advance. If you don't receive a letter, please call our office to schedule the follow-up appointment.

## 2014-08-03 DIAGNOSIS — Z23 Encounter for immunization: Secondary | ICD-10-CM | POA: Diagnosis not present

## 2014-08-18 ENCOUNTER — Ambulatory Visit (INDEPENDENT_AMBULATORY_CARE_PROVIDER_SITE_OTHER): Payer: Medicare Other | Admitting: *Deleted

## 2014-08-18 DIAGNOSIS — I4891 Unspecified atrial fibrillation: Secondary | ICD-10-CM

## 2014-08-18 DIAGNOSIS — I4892 Unspecified atrial flutter: Secondary | ICD-10-CM | POA: Diagnosis not present

## 2014-08-18 DIAGNOSIS — Z5181 Encounter for therapeutic drug level monitoring: Secondary | ICD-10-CM

## 2014-08-18 DIAGNOSIS — I6340 Cerebral infarction due to embolism of unspecified cerebral artery: Secondary | ICD-10-CM | POA: Diagnosis not present

## 2014-08-18 LAB — POCT INR: INR: 1.8

## 2014-08-29 ENCOUNTER — Ambulatory Visit (INDEPENDENT_AMBULATORY_CARE_PROVIDER_SITE_OTHER): Payer: Medicare Other

## 2014-08-29 DIAGNOSIS — I6340 Cerebral infarction due to embolism of unspecified cerebral artery: Secondary | ICD-10-CM | POA: Diagnosis not present

## 2014-08-29 DIAGNOSIS — I4892 Unspecified atrial flutter: Secondary | ICD-10-CM

## 2014-08-29 DIAGNOSIS — I4891 Unspecified atrial fibrillation: Secondary | ICD-10-CM

## 2014-08-29 DIAGNOSIS — Z5181 Encounter for therapeutic drug level monitoring: Secondary | ICD-10-CM

## 2014-08-29 LAB — POCT INR: INR: 2.3

## 2014-09-26 ENCOUNTER — Ambulatory Visit (INDEPENDENT_AMBULATORY_CARE_PROVIDER_SITE_OTHER): Payer: Medicare Other | Admitting: *Deleted

## 2014-09-26 DIAGNOSIS — I4891 Unspecified atrial fibrillation: Secondary | ICD-10-CM | POA: Diagnosis not present

## 2014-09-26 DIAGNOSIS — I4892 Unspecified atrial flutter: Secondary | ICD-10-CM

## 2014-09-26 DIAGNOSIS — I6340 Cerebral infarction due to embolism of unspecified cerebral artery: Secondary | ICD-10-CM

## 2014-09-26 DIAGNOSIS — Z5181 Encounter for therapeutic drug level monitoring: Secondary | ICD-10-CM

## 2014-09-26 LAB — POCT INR: INR: 2.7

## 2014-10-07 ENCOUNTER — Other Ambulatory Visit: Payer: Self-pay | Admitting: Cardiology

## 2014-10-27 ENCOUNTER — Ambulatory Visit (INDEPENDENT_AMBULATORY_CARE_PROVIDER_SITE_OTHER): Payer: Medicare Other | Admitting: Internal Medicine

## 2014-10-27 ENCOUNTER — Encounter: Payer: Self-pay | Admitting: Internal Medicine

## 2014-10-27 VITALS — BP 130/80 | HR 66 | Temp 97.9°F | Resp 16 | Ht 68.0 in | Wt 182.0 lb

## 2014-10-27 DIAGNOSIS — I482 Chronic atrial fibrillation, unspecified: Secondary | ICD-10-CM

## 2014-10-27 DIAGNOSIS — B029 Zoster without complications: Secondary | ICD-10-CM | POA: Insufficient documentation

## 2014-10-27 DIAGNOSIS — I6340 Cerebral infarction due to embolism of unspecified cerebral artery: Secondary | ICD-10-CM

## 2014-10-27 DIAGNOSIS — F528 Other sexual dysfunction not due to a substance or known physiological condition: Secondary | ICD-10-CM

## 2014-10-27 DIAGNOSIS — I10 Essential (primary) hypertension: Secondary | ICD-10-CM

## 2014-10-27 MED ORDER — PREGABALIN 50 MG PO CAPS: 50.0000 mg | ORAL_CAPSULE | Freq: Three times a day (TID) | ORAL | Status: DC

## 2014-10-27 MED ORDER — VALACYCLOVIR HCL 1 G PO TABS: 1000.0000 mg | ORAL_TABLET | Freq: Two times a day (BID) | ORAL | Status: DC

## 2014-10-27 MED ORDER — SILDENAFIL CITRATE 100 MG PO TABS: 100.0000 mg | ORAL_TABLET | ORAL | Status: DC | PRN

## 2014-10-27 NOTE — Patient Instructions (Signed)

## 2014-10-27 NOTE — Assessment & Plan Note (Signed)
His blood pressure is adequately well controlled. 

## 2014-10-27 NOTE — Assessment & Plan Note (Signed)
He has good rate and rhythm control today. 

## 2014-10-27 NOTE — Progress Notes (Signed)
Pre visit review using our clinic review tool, if applicable. No additional management support is needed unless otherwise documented below in the visit note. 

## 2014-10-27 NOTE — Progress Notes (Signed)
Subjective:  Patient ID: [name] [name], male    DOB: 06-02-1940  Age: 74 y.o. MRN: [id number]  CC: Rash   HPI [name] [name] presents for a painful rash around his left back, left flank and left abdomen for 2 weeks. He has been trying a topical steroid with no relief from the symptoms.  Outpatient Prescriptions Prior to Visit  Medication Sig Dispense Refill  . atorvastatin (LIPITOR) 80 MG tablet Take 1 tablet by mouth  daily 90 tablet 2  . clobetasol cream (TEMOVATE) 0.05 % as needed (skin irritation).     . Coenzyme Q10-Vitamin E 100-300 MG-UNIT WAFR Take by mouth daily.    . Cyanocobalamin (VITAMIN B 12 PO) Take 1 tablet by mouth daily.    [name] dronedarone (MULTAQ) 400 MG tablet Take 1 tablet (400 mg total) by mouth 2 (two) times daily with a meal. 60 tablet 6  . fish oil-omega-3 fatty acids 1000 MG capsule Take 3 g by mouth daily.    . fluocinonide cream (LIDEX) 0.05 % Apply topically as needed (FOR ITCHING).     . hydrocortisone cream (RA ANTI-ITCH MAXIMUM STRENGTH) 1 % Apply 1 application topically as needed for itching.     . LevOCARNitine (CARNITINE PO) Take 750 mg by mouth daily.    [name] loratadine (CLARITIN) 10 MG tablet Take 10 mg by mouth as needed for allergies.    . Magnesium 400 MG TABS Take 400 mg by mouth daily.    . metoprolol succinate (TOPROL-XL) 50 MG 24 hr tablet Take 1 tablet (50 mg total) by mouth 2 (two) times daily at 8 am and 10 pm. 180 tablet 2  . MULTAQ 400 MG tablet Take 1 tablet  by mouth 2  times daily with a meal. 180 tablet 2  . multivitamin (THERAGRAN) per tablet Take 1 tablet by mouth daily.    . Probiotic Product (PROBIOTIC FORMULA PO) Take 2 capsules by mouth daily.     . ranolazine (RANEXA) 500 MG 12 hr tablet Take 1 tablet (500 mg total) by mouth 2 (two) times daily. 180 tablet 3  . saw palmetto 80 MG capsule Take 80 mg by mouth 2 (two) times daily.    [name] warfarin (COUMADIN) 5 MG tablet Take as directed by  [facility] 120 tablet 0  . sildenafil  (VIAGRA) 100 MG tablet Take 1 tablet (100 mg total) by mouth as needed. (Patient taking differently: Take 100 mg by mouth as needed for erectile dysfunction. ) 10 tablet 6   No facility-administered medications prior to visit.    ROS Review of Systems  Constitutional: Negative.  Negative for fever, chills, diaphoresis, appetite change and fatigue.  HENT: Negative.  Negative for sore throat and trouble swallowing.   Eyes: Negative.   Respiratory: Negative.  Negative for cough, choking, chest tightness, shortness of breath and stridor.   Cardiovascular: Negative.  Negative for chest pain, palpitations and leg swelling.  Gastrointestinal: Negative.  Negative for nausea, abdominal pain and diarrhea.  Endocrine: Negative.   Genitourinary: Negative.   Musculoskeletal: Negative.   Skin: Positive for rash.  Allergic/Immunologic: Negative.   Neurological: Negative.  Negative for dizziness, tremors, weakness, light-headedness, numbness and headaches.  Hematological: Negative.  Negative for adenopathy. Does not bruise/bleed easily.  Psychiatric/Behavioral: Negative.     Objective:  BP 130/80 mmHg  Pulse 66  Temp(Src) 97.9 F (36.6 C) (Oral)  Resp 16  Ht 5\' 8"  (1.727 m)  Wt 182 lb (82.555 kg)  BMI 27.68 kg/m2  SpO2 96%  BP Readings from Last 3 Encounters:  10/27/14 130/80  07/31/14 122/60  07/21/14 123/83    Wt Readings from Last 3 Encounters:  10/27/14 182 lb (82.555 kg)  07/31/14 195 lb (88.451 kg)  07/21/14 200 lb 3.2 oz (90.81 kg)    Physical Exam  Constitutional: He appears well-developed and well-nourished. No distress.  HENT:  Mouth/Throat: Oropharynx is clear and moist. No oropharyngeal exudate.  Eyes: Conjunctivae are normal. Right eye exhibits no discharge. Left eye exhibits no discharge. No scleral icterus.  Neck: Normal range of motion. Neck supple. No JVD present. No tracheal deviation present. No thyromegaly present.  Cardiovascular: Normal rate, regular rhythm,  normal heart sounds and intact distal pulses.  Exam reveals no gallop and no friction rub.   No murmur heard. Pulmonary/Chest: Effort normal and breath sounds normal. No stridor. No respiratory distress. He has no wheezes. He has no rales. He exhibits no tenderness.  Abdominal: Soft. Bowel sounds are normal. He exhibits no distension and no mass. There is no tenderness. There is no rebound and no guarding.  Musculoskeletal: Normal range of motion. He exhibits no edema or tenderness.  Lymphadenopathy:    He has no cervical adenopathy.       Right cervical: No superficial cervical adenopathy present.      Left cervical: No superficial cervical adenopathy present.    He has no axillary adenopathy.       Right axillary: No pectoral and no lateral adenopathy present.       Left axillary: No pectoral and no lateral adenopathy present.      Right: No supraclavicular adenopathy present.       Left: No supraclavicular adenopathy present.  Skin: Skin is warm and dry. Rash noted. No abrasion, no bruising, no burn, no ecchymosis, no laceration, no lesion, no petechiae and no purpura noted. Rash is vesicular. Rash is not macular, not papular, not maculopapular, not nodular, not pustular and not urticarial. He is not diaphoretic. There is erythema. No cyanosis. Nails show no clubbing.     Vitals reviewed.   Lab Results  Component Value Date   WBC 9.5 06/18/2014   HGB 13.3 06/18/2014   HCT 40.7 06/18/2014   PLT 241 06/18/2014   GLUCOSE 101* 06/18/2014   CHOL 102 11/26/2013   TRIG 93 11/26/2013   HDL 25* 11/26/2013   LDLCALC 58 11/26/2013   ALT 45 02/07/2014   AST 35 02/07/2014   NA 141 06/18/2014   K 4.3 06/18/2014   CL 106 06/18/2014   CREATININE 1.28 06/18/2014   BUN 21 06/18/2014   CO2 27 06/18/2014   TSH 3.060 11/26/2013   PSA 10.30* 12/19/2013   INR 2.7 09/26/2014   HGBA1C 6.1 12/19/2013    No results found.  Assessment & Plan:    I have changed [name]'s sildenafil. I am  also having him start on valACYclovir and pregabalin. Additionally, I am having him maintain his Coenzyme Q10-Vitamin E, fish oil-omega-3 fatty acids, Probiotic Product (PROBIOTIC FORMULA PO), multivitamin, loratadine, Cyanocobalamin (VITAMIN B 12 PO), saw palmetto, dronedarone, fluocinonide cream, hydrocortisone cream, metoprolol succinate, clobetasol cream, LevOCARNitine (CARNITINE PO), Magnesium, ranolazine, atorvastatin, warfarin, and MULTAQ.  Meds ordered this encounter  Medications  . valACYclovir (VALTREX) 1000 MG tablet    Sig: Take 1 tablet (1,000 mg total) by mouth 2 (two) times daily.    Dispense:  20 tablet    Refill:  0  . pregabalin (LYRICA) 50 MG capsule    Sig: Take  1 capsule (50 mg total) by mouth 3 (three) times daily.    Dispense:  63 capsule    Refill:  0  . sildenafil (VIAGRA) 100 MG tablet    Sig: Take 1 tablet (100 mg total) by mouth as needed for erectile dysfunction.    Dispense:  10 tablet    Refill:  6     Follow-up: Return in about 4 weeks (around 11/24/2014).  Scarlette Calico, MD

## 2014-10-27 NOTE — Assessment & Plan Note (Signed)
I will treat the infection with valacyclovir. I recommended that he try Lyrica for pain relief.

## 2014-10-28 DIAGNOSIS — R972 Elevated prostate specific antigen [PSA]: Secondary | ICD-10-CM | POA: Diagnosis not present

## 2014-10-29 ENCOUNTER — Ambulatory Visit (INDEPENDENT_AMBULATORY_CARE_PROVIDER_SITE_OTHER): Payer: Medicare Other | Admitting: *Deleted

## 2014-10-29 DIAGNOSIS — I4892 Unspecified atrial flutter: Secondary | ICD-10-CM | POA: Diagnosis not present

## 2014-10-29 DIAGNOSIS — I6340 Cerebral infarction due to embolism of unspecified cerebral artery: Secondary | ICD-10-CM

## 2014-10-29 DIAGNOSIS — Z5181 Encounter for therapeutic drug level monitoring: Secondary | ICD-10-CM | POA: Diagnosis not present

## 2014-10-29 DIAGNOSIS — I4891 Unspecified atrial fibrillation: Secondary | ICD-10-CM | POA: Diagnosis not present

## 2014-10-29 LAB — POCT INR: INR: 2.5

## 2014-11-20 ENCOUNTER — Telehealth: Payer: Self-pay | Admitting: Internal Medicine

## 2014-11-20 NOTE — Telephone Encounter (Signed)
Please advise 

## 2014-11-20 NOTE — Telephone Encounter (Signed)
Pt called in and said that he has been around strep from his grand kids and wanted to know if Dr Waynard Reeds would send in some antibiotics for him to prevent him from getting it?

## 2014-11-23 NOTE — Telephone Encounter (Signed)
He should not take antibiotics unless he develops symptoms

## 2014-11-25 NOTE — Telephone Encounter (Signed)
LMOVM> If pt calls back please inform of msg below

## 2014-11-26 ENCOUNTER — Ambulatory Visit (INDEPENDENT_AMBULATORY_CARE_PROVIDER_SITE_OTHER): Payer: Medicare Other | Admitting: *Deleted

## 2014-11-26 DIAGNOSIS — I4892 Unspecified atrial flutter: Secondary | ICD-10-CM | POA: Diagnosis not present

## 2014-11-26 DIAGNOSIS — Z5181 Encounter for therapeutic drug level monitoring: Secondary | ICD-10-CM | POA: Diagnosis not present

## 2014-11-26 DIAGNOSIS — I4891 Unspecified atrial fibrillation: Secondary | ICD-10-CM | POA: Diagnosis not present

## 2014-11-26 DIAGNOSIS — I6340 Cerebral infarction due to embolism of unspecified cerebral artery: Secondary | ICD-10-CM

## 2014-11-26 LAB — POCT INR: INR: 2.4

## 2014-12-16 DIAGNOSIS — Z23 Encounter for immunization: Secondary | ICD-10-CM | POA: Diagnosis not present

## 2015-01-01 ENCOUNTER — Ambulatory Visit (INDEPENDENT_AMBULATORY_CARE_PROVIDER_SITE_OTHER): Payer: Medicare Other | Admitting: *Deleted

## 2015-01-01 DIAGNOSIS — Z5181 Encounter for therapeutic drug level monitoring: Secondary | ICD-10-CM | POA: Diagnosis not present

## 2015-01-01 DIAGNOSIS — I4892 Unspecified atrial flutter: Secondary | ICD-10-CM | POA: Diagnosis not present

## 2015-01-01 DIAGNOSIS — I4891 Unspecified atrial fibrillation: Secondary | ICD-10-CM

## 2015-01-01 LAB — POCT INR: INR: 1.9

## 2015-01-13 DIAGNOSIS — R972 Elevated prostate specific antigen [PSA]: Secondary | ICD-10-CM | POA: Diagnosis not present

## 2015-01-19 ENCOUNTER — Ambulatory Visit (INDEPENDENT_AMBULATORY_CARE_PROVIDER_SITE_OTHER): Payer: Medicare Other | Admitting: Neurology

## 2015-01-19 ENCOUNTER — Encounter: Payer: Self-pay | Admitting: Neurology

## 2015-01-19 VITALS — BP 131/81 | HR 67 | Ht 68.0 in | Wt 195.2 lb

## 2015-01-19 DIAGNOSIS — I63412 Cerebral infarction due to embolism of left middle cerebral artery: Secondary | ICD-10-CM | POA: Diagnosis not present

## 2015-01-19 DIAGNOSIS — I482 Chronic atrial fibrillation, unspecified: Secondary | ICD-10-CM

## 2015-01-19 DIAGNOSIS — Z7901 Long term (current) use of anticoagulants: Secondary | ICD-10-CM | POA: Insufficient documentation

## 2015-01-19 DIAGNOSIS — I6340 Cerebral infarction due to embolism of unspecified cerebral artery: Secondary | ICD-10-CM

## 2015-01-19 DIAGNOSIS — I10 Essential (primary) hypertension: Secondary | ICD-10-CM

## 2015-01-19 DIAGNOSIS — R972 Elevated prostate specific antigen [PSA]: Secondary | ICD-10-CM | POA: Diagnosis not present

## 2015-01-19 NOTE — Progress Notes (Signed)
STROKE NEUROLOGY FOLLOW UP NOTE  NAME: [name] [name] DOB: [birth year]  REASON FOR VISIT: stroke follow up HISTORY FROM: pt and chart  Today we had the pleasure of seeing [name] in follow-up at our Neurology Clinic. Pt was accompanied by no one.   History Summary [name] [name] is a 74 y.o. 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 He was off coumadin for a while as per cardiology before the admission on 08/05/13 due to acute onset expressive aphasia, right facial droop and right-sided numbness. This was completely resolved before ER arrival. Patient was not administered TPA secondary to symptoms resolved. In ER, he still showing in A flutter rhythm. After admission, MRI showed acute left frontal stroke but also with subacute left parietal infarct, consistent with embolic stroke. Cardiology was consulted and agreed with anticoagulation with coumadin. He was discharged with coumadin.   Follow up 10/14/13 - the patient has been doing well. He stated that he had cardioversion with [facility] 3 weeks ago and for a week his heart rhythm is in sinus but for the last two weeks he was still in Afib or Aflutter. He is going to [facility] this week to see if he is a candidate for ablation to treat his heart rhythm but he is aware that he needs to take anticoagulation likely for life. He followed up with INR check and his INR was in good range between 2-3.  Follow up 01/20/14 - pt was doing well. He had ablation procedure done in [facility] last in September. It was successful and he did not have any symptoms of palpitation or CP. His INR check 4 days ago was 2.2. His is compliant with coumadin. BP stable and today 140/82. He said his BP at home around 120s. He has appointment follow up with his cardiologist Dr. [name] next  month and also with his cardiologist in [facility].  Follow up 07/21/14 - pt has been doing well. He had another ablation procedure done during the interval time and currently feels good and no palpitations. BP 123/83, in good control. Last INR 4.4 about 4 days ago.   Interval History During the interval time, pt has been doing well. No recurrent stroke like symptoms. Last INR 1.9 and his coumadin increased on dose. But he stated that his INR usually in good control. He is going to [facility] next Monday for follow up after ablation. BP in clinic 131/81.  REVIEW OF SYSTEMS: Full 14 system review of systems performed and notable only for those listed below and in HPI above, all others are negative:  Constitutional: N/A  Cardiovascular:   Ear/Nose/Throat: N/A  Skin: Eyes: N/A  Respiratory:  Gastroitestinal: N/A  Genitourinary: N/A Hematology/Lymphatic: bruise easily Endocrine: N/A  Musculoskeletal: N/A  Allergy/Immunology: N/A  Neurological: N/A  Psychiatric: N/A Sleep: snoring  The following represents the patient's updated allergies and side effects list: Allergies  Allergen Reactions  . Naproxen     REACTION: Hives    Labs since last visit of relevance include the following: Results for orders placed or performed in visit on 01/01/15  POCT INR  Result Value Ref Range   INR 1.9     The neurologically relevant items on the patient's problem list were reviewed on today's visit.  Neurologic Examination  A problem focused neurological exam (12  or more points of the single system neurologic examination, vital signs counts as 1 point, cranial nerves count for 8 points) was performed.  Blood pressure 131/81, pulse 67, height 5\' 8"  (1.727 m), weight 195 lb 3.2 oz (88.542 kg).  General - Well nourished, well developed, in no apparent distress.  Ophthalmologic - Sharp disc margins OU.  Cardiovascular - regular heart rhythm.  Mental Status -  Level of arousal and  orientation to time, place, and person were intact. Language including expression, naming, repetition, comprehension, reading, and writing was assessed and found intact.  Cranial Nerves II - XII -  II - Vision intact OU. III, IV, VI - Extraocular movements intact. V - Facial sensation intact bilaterally. VII - Facial movement intact bilaterally. VIII - Hearing & vestibular intact bilaterally. X - Palate elevates symmetrically. XI - Chin turning & shoulder shrug intact bilaterally. XII - Tongue protrusion intact.  Motor Strength - The patient's strength was normal in all extremities and pronator drift was absent.  Bulk was normal and fasciculations were absent.   Motor Tone - Muscle tone was assessed at the neck and appendages and was normal.  Reflexes - The patient's reflexes were normal in all extremities and he had no pathological reflexes.  Sensory - Light touch, temperature/pinprick, vibration and proprioception, and Romberg testing were assessed and were normal.    Coordination - The patient had normal movements in the hands and feet with no ataxia or dysmetria.  Tremor was absent.  Gait and Station - The patient's transfers, posture, gait, station, and turns were observed as normal.  Data reviewed: I personally reviewed the images and agree with the radiology interpretations.  CT of the brain 08/05/2013 1. 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 There is no evidence of an acute infarct. 2. Mild atrophy and chronic microvascular ischemic change. 3. No other abnormalities.  MRI of the brain 08/06/2013 Scattered small acute nonhemorrhagic left frontal lobe infarct. Subacute left parietal lobe infarct with laminar necrosis (less likely gyriform petechial hemorrhage). Tiny punctate region of blood breakdown products posterior left temporal lobe may reflect result of hemorrhage ischemia without intracranial hemorrhage otherwise noted. Mild to moderate small vessel disease  type changes. Mild gyriform enhancement of the subacute left parietal lobe infarct otherwise no evidence of intracranial enhancing lesion or mass identified. Mild mucosal thickening inferior aspect right maxillary sinus. Transverse ligament hypertrophy with minimal anterior slippage of the C1 ring and mild posterior displacement of the dens contributing to moderate spinal stenosis and cord flattening greater on the left.  MRA of the brain 08/06/2013 Small caliber A2 segment of the left anterior cerebral artery with majority of the A2 segment supplied from the right. Anterior circulation without medium or large size vessel significant stenosis or occlusion. Mild middle cerebral artery branch vessel irregularity greater on the left. Right posterior inferior cerebellar artery and anterior inferior cerebellar artery have a single origin from the proximal basilar artery. Similar appearance on the left slightly distal to this level. No significant stenosis of the distal vertebral arteries or basilar artery. Minimal irregularity distal posterior cerebral artery branch vessels.  MRA of the neck 08/06/2013 Ectatic 3 vessel aortic arch. Ectasia great vessels. Mild narrowing proximal right subclavian artery. Minimal narrowing proximal right vertebral artery. Mild ectasia proximal left vertebral artery. No significant stenosis of the vertebral arteries. No significant stenosis of either carotid artery. Prominent and ectasia of the vertical segment of the internal carotid arteries bilaterally.  2D Echocardiogram EF 55-60% with no  source of embolus.  Carotid Doppler No evidence of hemodynamically significant internal carotid artery stenosis. Vertebral artery flow is antegrade.  CXR 08/06/2013 No acute cardiopulmonary disease.   Assessment: As you may recall, he is a 74 y.o. Caucasian male with a diagnosis of stroke followed up in clinic today. He presented to [facility] on 08/[facility]/13 due to aphasia, right facial droop and right arm  numbness. MRI showed acute left frontal stroke with subacute left parietal stroke, consistent with embolic stroke. Given his hx of rheumatic fever with mitral and aortic valve abnormality s/p valvular replacement and on afib with discontinued coumadin by cardiology, his stroke was high likely due to afib not on anticoagulation. Discussed with cardiology, he was put on coumadin. After discharge, he has been follow up with coumadin clinic and INR is on the goal between 2-3. He had cardioversion with cardiology and ablation procedure in [facility] and now he is in NSR. Last INR 1.9.    Plan:  - continue coumadin and and lipitor for stroke prevention - goal INR 2-3.  - follow up with cardiologists as scheduled. - Follow up with your primary care physician for stroke risk factor modification. Recommend maintain blood pressure goal <130/80, diabetes with hemoglobin A1c goal below 6.5% and lipids with LDL cholesterol goal below 70 mg/dL.  - check BP at home - follow up in one year.    Patient Instructions  - continue coumadin and and lipitor for stroke prevention - goal INR 2-3.  - follow up with cardiologists as scheduled. - Follow up with your primary care physician for stroke risk factor modification. Recommend maintain blood pressure goal <130/80, diabetes with hemoglobin A1c goal below 6.5% and lipids with LDL cholesterol goal below 70 mg/dL.  - check BP at home - follow up in one year.   Rosalin Hawking, MD PhD [facility] [address] [place],  [address] [phone]

## 2015-01-19 NOTE — Patient Instructions (Signed)
-   continue coumadin and and lipitor for stroke prevention - goal INR 2-3.  - follow up with cardiologists as scheduled. - Follow up with your primary care physician for stroke risk factor modification. Recommend maintain blood pressure goal <130/80, diabetes with hemoglobin A1c goal below 6.5% and lipids with LDL cholesterol goal below 70 mg/dL.  - check BP at home - follow up in one year.

## 2015-02-11 ENCOUNTER — Ambulatory Visit (INDEPENDENT_AMBULATORY_CARE_PROVIDER_SITE_OTHER): Payer: Medicare Other | Admitting: Pharmacist

## 2015-02-11 DIAGNOSIS — I4891 Unspecified atrial fibrillation: Secondary | ICD-10-CM

## 2015-02-11 DIAGNOSIS — Z5181 Encounter for therapeutic drug level monitoring: Secondary | ICD-10-CM

## 2015-02-11 DIAGNOSIS — I4892 Unspecified atrial flutter: Secondary | ICD-10-CM | POA: Diagnosis not present

## 2015-02-11 LAB — POCT INR: INR: 1.9

## 2015-02-12 DIAGNOSIS — L239 Allergic contact dermatitis, unspecified cause: Secondary | ICD-10-CM | POA: Diagnosis not present

## 2015-02-12 DIAGNOSIS — D1801 Hemangioma of skin and subcutaneous tissue: Secondary | ICD-10-CM | POA: Diagnosis not present

## 2015-02-12 DIAGNOSIS — L723 Sebaceous cyst: Secondary | ICD-10-CM | POA: Diagnosis not present

## 2015-02-12 DIAGNOSIS — L821 Other seborrheic keratosis: Secondary | ICD-10-CM | POA: Diagnosis not present

## 2015-02-12 DIAGNOSIS — L570 Actinic keratosis: Secondary | ICD-10-CM | POA: Diagnosis not present

## 2015-02-12 DIAGNOSIS — D225 Melanocytic nevi of trunk: Secondary | ICD-10-CM | POA: Diagnosis not present

## 2015-02-19 DIAGNOSIS — I712 Thoracic aortic aneurysm, without rupture: Secondary | ICD-10-CM | POA: Diagnosis not present

## 2015-02-19 DIAGNOSIS — I482 Chronic atrial fibrillation: Secondary | ICD-10-CM | POA: Diagnosis not present

## 2015-02-19 DIAGNOSIS — E785 Hyperlipidemia, unspecified: Secondary | ICD-10-CM | POA: Diagnosis not present

## 2015-02-19 DIAGNOSIS — I517 Cardiomegaly: Secondary | ICD-10-CM | POA: Diagnosis not present

## 2015-02-19 DIAGNOSIS — I484 Atypical atrial flutter: Secondary | ICD-10-CM | POA: Diagnosis not present

## 2015-02-19 DIAGNOSIS — I480 Paroxysmal atrial fibrillation: Secondary | ICD-10-CM | POA: Diagnosis not present

## 2015-02-19 DIAGNOSIS — Q268 Other congenital malformations of great veins: Secondary | ICD-10-CM | POA: Diagnosis not present

## 2015-02-19 DIAGNOSIS — I059 Rheumatic mitral valve disease, unspecified: Secondary | ICD-10-CM | POA: Diagnosis not present

## 2015-02-19 DIAGNOSIS — I359 Nonrheumatic aortic valve disorder, unspecified: Secondary | ICD-10-CM | POA: Diagnosis not present

## 2015-02-19 DIAGNOSIS — I77810 Thoracic aortic ectasia: Secondary | ICD-10-CM | POA: Diagnosis not present

## 2015-02-19 DIAGNOSIS — I4891 Unspecified atrial fibrillation: Secondary | ICD-10-CM | POA: Diagnosis not present

## 2015-02-19 DIAGNOSIS — R799 Abnormal finding of blood chemistry, unspecified: Secondary | ICD-10-CM | POA: Diagnosis not present

## 2015-02-19 DIAGNOSIS — I440 Atrioventricular block, first degree: Secondary | ICD-10-CM | POA: Diagnosis not present

## 2015-02-19 DIAGNOSIS — Z952 Presence of prosthetic heart valve: Secondary | ICD-10-CM | POA: Diagnosis not present

## 2015-03-04 ENCOUNTER — Other Ambulatory Visit: Payer: Self-pay | Admitting: *Deleted

## 2015-03-04 MED ORDER — WARFARIN SODIUM 5 MG PO TABS: ORAL_TABLET | ORAL | Status: DC

## 2015-03-04 MED ORDER — METOPROLOL SUCCINATE ER 50 MG PO TB24: 50.0000 mg | ORAL_TABLET | Freq: Two times a day (BID) | ORAL | Status: DC

## 2015-03-13 ENCOUNTER — Ambulatory Visit (INDEPENDENT_AMBULATORY_CARE_PROVIDER_SITE_OTHER): Payer: Medicare Other | Admitting: Pharmacist

## 2015-03-13 DIAGNOSIS — Z5181 Encounter for therapeutic drug level monitoring: Secondary | ICD-10-CM | POA: Diagnosis not present

## 2015-03-13 DIAGNOSIS — I4891 Unspecified atrial fibrillation: Secondary | ICD-10-CM | POA: Diagnosis not present

## 2015-03-13 LAB — POCT INR: INR: 1.9

## 2015-04-10 ENCOUNTER — Ambulatory Visit (INDEPENDENT_AMBULATORY_CARE_PROVIDER_SITE_OTHER): Payer: Medicare Other | Admitting: *Deleted

## 2015-04-10 DIAGNOSIS — I4891 Unspecified atrial fibrillation: Secondary | ICD-10-CM | POA: Diagnosis not present

## 2015-04-10 DIAGNOSIS — Z5181 Encounter for therapeutic drug level monitoring: Secondary | ICD-10-CM

## 2015-04-10 LAB — POCT INR: INR: 2.3

## 2015-05-08 ENCOUNTER — Ambulatory Visit (INDEPENDENT_AMBULATORY_CARE_PROVIDER_SITE_OTHER): Payer: Medicare Other | Admitting: *Deleted

## 2015-05-08 DIAGNOSIS — Z5181 Encounter for therapeutic drug level monitoring: Secondary | ICD-10-CM | POA: Diagnosis not present

## 2015-05-08 DIAGNOSIS — I4891 Unspecified atrial fibrillation: Secondary | ICD-10-CM

## 2015-05-08 LAB — POCT INR: INR: 2.8

## 2015-06-05 ENCOUNTER — Ambulatory Visit (INDEPENDENT_AMBULATORY_CARE_PROVIDER_SITE_OTHER): Payer: Medicare Other | Admitting: *Deleted

## 2015-06-05 DIAGNOSIS — H2513 Age-related nuclear cataract, bilateral: Secondary | ICD-10-CM | POA: Diagnosis not present

## 2015-06-05 DIAGNOSIS — H40003 Preglaucoma, unspecified, bilateral: Secondary | ICD-10-CM | POA: Diagnosis not present

## 2015-06-05 DIAGNOSIS — I4891 Unspecified atrial fibrillation: Secondary | ICD-10-CM

## 2015-06-05 DIAGNOSIS — Z83511 Family history of glaucoma: Secondary | ICD-10-CM | POA: Diagnosis not present

## 2015-06-05 DIAGNOSIS — H5213 Myopia, bilateral: Secondary | ICD-10-CM | POA: Diagnosis not present

## 2015-06-05 DIAGNOSIS — Z5181 Encounter for therapeutic drug level monitoring: Secondary | ICD-10-CM | POA: Diagnosis not present

## 2015-06-05 DIAGNOSIS — H40013 Open angle with borderline findings, low risk, bilateral: Secondary | ICD-10-CM | POA: Diagnosis not present

## 2015-06-05 DIAGNOSIS — H52223 Regular astigmatism, bilateral: Secondary | ICD-10-CM | POA: Diagnosis not present

## 2015-06-05 DIAGNOSIS — H524 Presbyopia: Secondary | ICD-10-CM | POA: Diagnosis not present

## 2015-06-05 LAB — POCT INR: INR: 2.8

## 2015-07-08 ENCOUNTER — Ambulatory Visit (INDEPENDENT_AMBULATORY_CARE_PROVIDER_SITE_OTHER): Payer: Medicare Other | Admitting: *Deleted

## 2015-07-08 ENCOUNTER — Ambulatory Visit (INDEPENDENT_AMBULATORY_CARE_PROVIDER_SITE_OTHER): Payer: Medicare Other | Admitting: Cardiology

## 2015-07-08 ENCOUNTER — Encounter: Payer: Self-pay | Admitting: Cardiology

## 2015-07-08 VITALS — BP 122/70 | HR 64 | Ht 68.0 in | Wt 206.8 lb

## 2015-07-08 DIAGNOSIS — Z5181 Encounter for therapeutic drug level monitoring: Secondary | ICD-10-CM

## 2015-07-08 DIAGNOSIS — E785 Hyperlipidemia, unspecified: Secondary | ICD-10-CM | POA: Diagnosis not present

## 2015-07-08 DIAGNOSIS — I4892 Unspecified atrial flutter: Secondary | ICD-10-CM | POA: Diagnosis not present

## 2015-07-08 DIAGNOSIS — I359 Nonrheumatic aortic valve disorder, unspecified: Secondary | ICD-10-CM

## 2015-07-08 DIAGNOSIS — I484 Atypical atrial flutter: Secondary | ICD-10-CM | POA: Diagnosis not present

## 2015-07-08 DIAGNOSIS — I4891 Unspecified atrial fibrillation: Secondary | ICD-10-CM | POA: Diagnosis not present

## 2015-07-08 DIAGNOSIS — I059 Rheumatic mitral valve disease, unspecified: Secondary | ICD-10-CM

## 2015-07-08 LAB — POCT INR: INR: 3

## 2015-07-08 MED ORDER — ATORVASTATIN CALCIUM 80 MG PO TABS
ORAL_TABLET | ORAL | Status: DC
Start: 2015-07-08 — End: 2016-04-20

## 2015-07-08 MED ORDER — LOSARTAN POTASSIUM 25 MG PO TABS: ORAL_TABLET | ORAL | Status: DC

## 2015-07-08 MED ORDER — DRONEDARONE HCL 400 MG PO TABS: ORAL_TABLET | ORAL | Status: DC

## 2015-07-08 MED ORDER — METOPROLOL SUCCINATE ER 50 MG PO TB24: 50.0000 mg | ORAL_TABLET | Freq: Two times a day (BID) | ORAL | Status: DC

## 2015-07-08 MED ORDER — RANOLAZINE ER 500 MG PO TB12: 500.0000 mg | ORAL_TABLET | Freq: Two times a day (BID) | ORAL | Status: DC

## 2015-07-08 NOTE — Patient Instructions (Signed)
Medication Instructions:  Start losartan 12.5mg  daily. This will be 1/2 of a 25mg  tablet daily  Labwork: Your physician recommends that you return for a FASTING lipid profile /BMET/CBCd. This is scheduled for May 12,2017. DO not eat or drink after midnight the night before the lab. The lab is open from 7:30AM-5PM.   Testing/Procedures: None   Follow-Up: Your physician wants you to follow-up in: 6 months with Dr [name]. (November 2017). You will receive a reminder letter in the mail two months in advance. If you don't receive a letter, please call our office to schedule the follow-up appointment.        If you need a refill on your cardiac medications before your next appointment, please call your pharmacy.

## 2015-07-09 DIAGNOSIS — I359 Nonrheumatic aortic valve disorder, unspecified: Secondary | ICD-10-CM | POA: Insufficient documentation

## 2015-07-09 DIAGNOSIS — I059 Rheumatic mitral valve disease, unspecified: Secondary | ICD-10-CM | POA: Insufficient documentation

## 2015-07-09 NOTE — Progress Notes (Signed)
Patient ID: [name] [name], male   DOB: [birth year], 74 y.o.   MRN: [id number] PCP: Dr. Maudie Mercury  74 yo with history of rheumatic fever and AI/[name] s/p bioprosthetic AVR and MVR at the [facility] as well as MAZE procedure all in 2006 presents for cardiology evaluation.  He also has an ascending aortic aneurysm.   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  I started him on dronedarone and coumadin with plan for cardioversion after 4 wks of therapeutic anticoagulation if he did not convert back to NSR on his own.  He went back into NSR spontaneously and later stopped coumadin. Atrial flutter recurred in 3/15 and again resolved spontaneously.  In 6/15, patient was admitted with transient dysarthria (resolved in about 10 minutes).  However, he was found to have evidence for CVA by MRI.  Therefore, he was started on coumadin. He was noted to be in atypical atrial flutter again.  Ranolazine was added to dronedarone.    In 8/15, [name] [name] was seen in consultation at the [facility] clinic.  He had atrial flutter ablation at [facility] in 11/15.    He was seen in followup at [facility] in 2/16.  Ranolazine was stopped.  Echo in 2/16 showed EF 55% with stable bioprosthetic mitral and aortic valves.  CTA chest showed stable 5 cm ascending aortic aneurysm.  He went back into atypical atrial flutter in 4/16.  Ranolazine was restarted and he was cardioverted back to NSR.    Last visit to [facility] was in 12/16.  Echo at that time showed EF 58% with stable bioprosthetic mitral and aortic valves.  CTA showed 4.9 cm aortic root at sinuses of valsalva.  Today, patient remains in NSR.  He has had no further neurological symptoms.  No exertional dyspnea or chest pain.  He is working out at [employer].  No lightheadedness or syncope.  He generally feels good. No palpitations. No BRBPR or melena.   ECG (12/12/11): Atypical atrial flutter versus atrial tachycardia with  rate around 100 bpm, QTc normal.  ECG (12/29/11): NSR, 1st degree AV block PR 242 msec.    ECG (12/13): NSR, 1st degree AV block PR 244 msec    ECG (10/14): NSR, 1st degree AV block ECG (3/15): Atypical atrial flutter with rate 87, inferior T wave inversions ECG (6/15): Atypical atrial flutter with rate 77 ECG (8/15): atypical atrial flutter rate 82 ECG (10/15): NSR, 1st degree AV block ECG (12/15): NSR, 1st degree AV block, QTc 410 msec ECG (5/16): NSR, 1st degree AV block, QTc 419 msec ECG (5/17): NSR, 1st degree AV block 270 msec, QTc 433  Labs (3/14): LDL 64, TSH normal, K 3.9, creatinine 1.0     Labs (6/15): LDL 54, HDL 48, creatinine 0.95   Labs (9/15): LDL 54, HDL 25 Labs (10/15): K 4.5, creatinine 1.3, AST 45, ALT 64 Labs (4/16): K 4.3, creatinine 1.28, HCT 40.7  PMH: 1. Atrial fibrillation: s/p MAZE and LAA ligation in 2006.  No documented atrial fibrillation since operation.  2. Atypical atrial flutter: 10/13, 3/15, and again in 6/15.  Recurrent 4/16 with DCCV.  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   - Echo 5/11 with EF 60-65%, normal bioprosthetic aortic valve, normal bioprosthetic mitral valve, moderate LAE.   - Echo 11/12 at [facility] looked ok per report.  - Echo (10/13) with EF 55-60%, mild LVH, normal bioprosthetic aortic and mitral valves, moderate LAE, PA systolic pressure 35 mmHg.   - Echo (6/15) with EF 60%, moderate LVH, normal-appearing bioprosthetic MV and AoV, PA systolic pressure 32 mmHg.   - Echo 9/15 [facility]) with EF 55%, mild LVH, PASP 52 mmHg, severe LAE, bioprosthetic mitral valve with no [name] and mean gradient 11 mmHg, moderate TR, bioprosthetic aortic valve with no AI and mean gradient 9 mmHg, dilated ascending aorta to 5 cm.   - Echo (2/16,  [facility]) with EF 55%, stable bioprosthetic aortic and mitral valves (AoV mean gradient 7 mmHg, MV mean gradient 4 mmHg), normal RV size and systolic function.  - Echo (12/16, [facility]) with EF 58%, mild RV dilation with low normal systolic function, PA systolic pressure 34 mmHg, bioprosthetic MV and AoV functioning normally.  4. Hyperlipidemia 5. L-spine surgery 6. LHC with no significant coronary disease in 2006 pre-op.  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  CTA chest 9/15 with dilated sinuses of valsalva to 5.0 cm and ascending aorta 4.9 cm. CTA chest (2/16) with 5.0 cm ascending aorta.  CTA chest (12/16) with 4.9 cm aortic root at SOV, 4.8 cm ascending aorta.   SH: Married, retired Forensic psychologist [name], [place], [place]).  2 sons.  Never smoked.   FH: No premature CAD.   ROS: All systems reviewed and negative except as per HPI.  Current Outpatient Prescriptions  Medication Sig Dispense Refill  . atorvastatin (LIPITOR) 80 MG tablet Take 1 tablet by mouth  daily 90 tablet 3  . clobetasol cream (TEMOVATE) 0.05 % as needed (skin irritation).     . Coenzyme Q10-Vitamin E 100-300 MG-UNIT WAFR Take by mouth daily.    . Cyanocobalamin (VITAMIN B 12 PO) Take 1 tablet by mouth daily.    [name] dronedarone (MULTAQ) 400 MG tablet Take 1 tablet  by mouth 2  times daily with a meal. 180 tablet 3  . fish oil-omega-3 fatty acids 1000 MG capsule Take 3 g by mouth daily.    . fluocinonide cream (LIDEX) 0.05 % Apply topically as needed (FOR ITCHING).     . hydrocortisone cream (RA ANTI-ITCH MAXIMUM STRENGTH) 1 % Apply 1 application topically as needed for itching.     . LevOCARNitine (CARNITINE PO) Take 750 mg by mouth daily.    [name] loratadine (CLARITIN) 10 MG tablet Take 10 mg by mouth as needed for allergies.    . Magnesium 400 MG TABS Take 400 mg by mouth daily.    . metoprolol succinate  (TOPROL-XL) 50 MG 24 hr tablet Take 1 tablet (50 mg total) by mouth 2 (two) times daily at 8 am and 10 pm. 180 tablet 3  . multivitamin (THERAGRAN) per tablet Take 1 tablet by mouth daily.    . pregabalin (LYRICA) 50 MG capsule Take 1 capsule (50 mg total) by mouth 3 (three) times daily. 63 capsule 0  .  Probiotic Product (PROBIOTIC FORMULA PO) Take 2 capsules by mouth daily.     . ranolazine (RANEXA) 500 MG 12 hr tablet Take 1 tablet (500 mg total) by mouth 2 (two) times daily. 180 tablet 3  . saw palmetto 80 MG capsule Take 80 mg by mouth 2 (two) times daily.    . sildenafil (VIAGRA) 100 MG tablet Take 1 tablet (100 mg total) by mouth as needed for erectile dysfunction. 10 tablet 6  . valACYclovir (VALTREX) 1000 MG tablet Take 1 tablet (1,000 mg total) by mouth 2 (two) times daily. 20 tablet 0  . warfarin (COUMADIN) 5 MG tablet Take as directed by  Coumadin Clinic 120 tablet 0  . losartan (COZAAR) 25 MG tablet 1/2 tablet (12.5mg ) daily 15 tablet 11   No current facility-administered medications for this visit.    BP 122/70 mmHg  Pulse 64  Ht 5\' 8"  (1.727 m)  Wt 206 lb 12.8 oz (93.804 kg)  BMI 31.45 kg/m2 General: NAD Neck: JVP 7 cm, no thyromegaly or thyroid nodule.  Lungs: Clear to auscultation bilaterally with normal respiratory effort. CV: Nondisplaced PMI.  Heart regular S1/S2, no S3/S4, 2/6 early SEM RUSB.  No peripheral edema.  No carotid bruit.  Normal pedal pulses.  Abdomen: Soft, nontender, no hepatosplenomegaly, no distention.  Neurologic: Alert and oriented x 3.  Psych: Normal affect. Extremities: No clubbing or cyanosis.   Assessment/Plan: 1. Atypical atrial flutter: Patient had a MAZE procedure with LAA ligation at the time of his valve replacement.  Since then, he has had occasional episodes of atypical atrial flutter. He had episodes in 10/13 and 3/15 that converted back to NSR on dronedarone. He was noted to be back in atrial flutter in 6/15 in the setting of a CVA.  He  had ablation at [facility] in 11/15.  Atrial flutter recurred after ranolazine was stopped and he had DCCV again in 4/16.  He remains in NSR today. - Continue coumadin. This will need to be continued long term even with ablation given CVA. Check CBC.  - Continue current Toprol XL. - He will continue dronedarone and ranolazine to maintain NSR.  QTc interval ok today.  He had recurrent atrial flutter when he tried stopping ranolazine.   2. 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  Valve looked stable on 12/16 echo at [facility]. 3. HTN: BP is reasonably controlled.  4. Hyperlipidemia: Check lipids today.    5. CVA: Suspect this was related to atrial flutter (despite LA appendage ligation).  He is now on coumadin. No residual neurologic deficits.  Continue long-term. 6. Thoracic aortic aneurysm: Dilated aortic root at sinuses of valsalva and ascending aorta to 4.9 cm, last CTA in 12/16 (stable).   - Continue Toprol XL given some evidence that beta blockers may attenuate thoracic aortic aneurysm growth.  I will add losartan 12.5 mg daily given some evidence for ARBs attenuating thoracic aortic aneurysm growth.  Repeat BMET in 10 days.  - Repeat CTA chest will need to be done in 12/17.   Followup with me in 6 months.    Loralie Champagne 07/09/2015

## 2015-07-15 DIAGNOSIS — R972 Elevated prostate specific antigen [PSA]: Secondary | ICD-10-CM | POA: Diagnosis not present

## 2015-07-17 ENCOUNTER — Other Ambulatory Visit (INDEPENDENT_AMBULATORY_CARE_PROVIDER_SITE_OTHER): Payer: Medicare Other | Admitting: *Deleted

## 2015-07-17 DIAGNOSIS — I4892 Unspecified atrial flutter: Secondary | ICD-10-CM | POA: Diagnosis not present

## 2015-07-17 DIAGNOSIS — E785 Hyperlipidemia, unspecified: Secondary | ICD-10-CM

## 2015-07-17 LAB — CBC WITH DIFFERENTIAL/PLATELET
BASOS PCT: 0 %
Basophils Absolute: 0 cells/uL (ref 0–200)
Eosinophils Absolute: 152 cells/uL (ref 15–500)
Eosinophils Relative: 2 %
HEMATOCRIT: 41.9 % (ref 38.5–50.0)
Hemoglobin: 14.2 g/dL (ref 13.2–17.1)
LYMPHS PCT: 30 %
Lymphs Abs: 2280 cells/uL (ref 850–3900)
MCH: 31.6 pg (ref 27.0–33.0)
MCHC: 33.9 g/dL (ref 32.0–36.0)
MCV: 93.1 fL (ref 80.0–100.0)
MONO ABS: 760 {cells}/uL (ref 200–950)
MONOS PCT: 10 %
MPV: 10.5 fL (ref 7.5–12.5)
Neutro Abs: 4408 cells/uL (ref 1500–7800)
Neutrophils Relative %: 58 %
PLATELETS: 249 10*3/uL (ref 140–400)
RBC: 4.5 MIL/uL (ref 4.20–5.80)
RDW: 14 % (ref 11.0–15.0)
WBC: 7.6 10*3/uL (ref 3.8–10.8)

## 2015-07-17 LAB — BASIC METABOLIC PANEL
BUN: 21 mg/dL (ref 7–25)
CO2: 24 mmol/L (ref 20–31)
Calcium: 9 mg/dL (ref 8.6–10.3)
Chloride: 106 mmol/L (ref 98–110)
Creat: 1.16 mg/dL (ref 0.70–1.18)
Glucose, Bld: 93 mg/dL (ref 65–99)
Potassium: 4.1 mmol/L (ref 3.5–5.3)
SODIUM: 139 mmol/L (ref 135–146)

## 2015-07-17 LAB — LIPID PANEL
CHOL/HDL RATIO: 2.9 ratio (ref <=5.0)
Cholesterol: 134 mg/dL (ref 125–200)
HDL: 47 mg/dL (ref >=40)
LDL Cholesterol: 66 mg/dL (ref <130)
Triglycerides: 105 mg/dL (ref <150)
VLDL: 21 mg/dL (ref <30)

## 2015-07-17 NOTE — Addendum Note (Signed)
Addended by: Eulis Foster on: 07/17/2015 09:56 AM   Modules accepted: Orders

## 2015-07-20 DIAGNOSIS — R972 Elevated prostate specific antigen [PSA]: Secondary | ICD-10-CM | POA: Diagnosis not present

## 2015-07-20 DIAGNOSIS — Z0000 Encounter for general adult medical examination without abnormal findings: Secondary | ICD-10-CM | POA: Diagnosis not present

## 2015-08-10 ENCOUNTER — Ambulatory Visit (INDEPENDENT_AMBULATORY_CARE_PROVIDER_SITE_OTHER): Payer: Medicare Other | Admitting: *Deleted

## 2015-08-10 DIAGNOSIS — I484 Atypical atrial flutter: Secondary | ICD-10-CM

## 2015-08-10 DIAGNOSIS — Z5181 Encounter for therapeutic drug level monitoring: Secondary | ICD-10-CM

## 2015-08-10 DIAGNOSIS — I4891 Unspecified atrial fibrillation: Secondary | ICD-10-CM | POA: Diagnosis not present

## 2015-08-10 LAB — POCT INR: INR: 2.8

## 2015-09-24 ENCOUNTER — Ambulatory Visit (INDEPENDENT_AMBULATORY_CARE_PROVIDER_SITE_OTHER): Payer: Medicare Other | Admitting: *Deleted

## 2015-09-24 DIAGNOSIS — I4891 Unspecified atrial fibrillation: Secondary | ICD-10-CM

## 2015-09-24 DIAGNOSIS — Z5181 Encounter for therapeutic drug level monitoring: Secondary | ICD-10-CM

## 2015-09-24 DIAGNOSIS — I484 Atypical atrial flutter: Secondary | ICD-10-CM

## 2015-09-24 LAB — POCT INR: INR: 3.5

## 2015-10-05 ENCOUNTER — Other Ambulatory Visit: Payer: Self-pay | Admitting: *Deleted

## 2015-10-05 MED ORDER — WARFARIN SODIUM 5 MG PO TABS: ORAL_TABLET | ORAL | 1 refills | Status: DC

## 2015-10-05 NOTE — Telephone Encounter (Signed)
Rx sent to [facility] per pt's request

## 2015-10-07 ENCOUNTER — Ambulatory Visit (INDEPENDENT_AMBULATORY_CARE_PROVIDER_SITE_OTHER): Payer: Medicare Other | Admitting: *Deleted

## 2015-10-07 DIAGNOSIS — I4891 Unspecified atrial fibrillation: Secondary | ICD-10-CM | POA: Diagnosis not present

## 2015-10-07 DIAGNOSIS — Z5181 Encounter for therapeutic drug level monitoring: Secondary | ICD-10-CM | POA: Diagnosis not present

## 2015-10-07 DIAGNOSIS — I484 Atypical atrial flutter: Secondary | ICD-10-CM | POA: Diagnosis not present

## 2015-10-07 LAB — POCT INR: INR: 2.2

## 2015-10-23 ENCOUNTER — Ambulatory Visit (INDEPENDENT_AMBULATORY_CARE_PROVIDER_SITE_OTHER): Payer: Medicare Other | Admitting: Pharmacist

## 2015-10-23 ENCOUNTER — Encounter (INDEPENDENT_AMBULATORY_CARE_PROVIDER_SITE_OTHER): Payer: Self-pay

## 2015-10-23 DIAGNOSIS — I4891 Unspecified atrial fibrillation: Secondary | ICD-10-CM | POA: Diagnosis not present

## 2015-10-23 DIAGNOSIS — Z5181 Encounter for therapeutic drug level monitoring: Secondary | ICD-10-CM | POA: Diagnosis not present

## 2015-10-23 LAB — POCT INR: INR: 3.2

## 2015-11-13 ENCOUNTER — Encounter (INDEPENDENT_AMBULATORY_CARE_PROVIDER_SITE_OTHER): Payer: Self-pay

## 2015-11-13 ENCOUNTER — Ambulatory Visit (INDEPENDENT_AMBULATORY_CARE_PROVIDER_SITE_OTHER): Payer: Medicare Other | Admitting: *Deleted

## 2015-11-13 DIAGNOSIS — I4891 Unspecified atrial fibrillation: Secondary | ICD-10-CM | POA: Diagnosis not present

## 2015-11-13 DIAGNOSIS — Z5181 Encounter for therapeutic drug level monitoring: Secondary | ICD-10-CM | POA: Diagnosis not present

## 2015-11-13 LAB — POCT INR: INR: 2.9

## 2015-12-14 DIAGNOSIS — Z23 Encounter for immunization: Secondary | ICD-10-CM | POA: Diagnosis not present

## 2015-12-15 ENCOUNTER — Ambulatory Visit (INDEPENDENT_AMBULATORY_CARE_PROVIDER_SITE_OTHER): Payer: Medicare Other | Admitting: *Deleted

## 2015-12-15 DIAGNOSIS — I4891 Unspecified atrial fibrillation: Secondary | ICD-10-CM | POA: Diagnosis not present

## 2015-12-15 DIAGNOSIS — Z5181 Encounter for therapeutic drug level monitoring: Secondary | ICD-10-CM

## 2015-12-15 LAB — POCT INR: INR: 3.2

## 2016-01-01 ENCOUNTER — Ambulatory Visit (INDEPENDENT_AMBULATORY_CARE_PROVIDER_SITE_OTHER): Payer: Medicare Other | Admitting: Pharmacist

## 2016-01-01 DIAGNOSIS — Z5181 Encounter for therapeutic drug level monitoring: Secondary | ICD-10-CM

## 2016-01-01 DIAGNOSIS — I4891 Unspecified atrial fibrillation: Secondary | ICD-10-CM | POA: Diagnosis not present

## 2016-01-01 LAB — POCT INR: INR: 2.8

## 2016-01-19 ENCOUNTER — Encounter: Payer: Self-pay | Admitting: Neurology

## 2016-01-19 ENCOUNTER — Ambulatory Visit (INDEPENDENT_AMBULATORY_CARE_PROVIDER_SITE_OTHER): Payer: Medicare Other | Admitting: Neurology

## 2016-01-19 VITALS — BP 128/74 | HR 62 | Ht 68.0 in | Wt 217.6 lb

## 2016-01-19 DIAGNOSIS — I359 Nonrheumatic aortic valve disorder, unspecified: Secondary | ICD-10-CM

## 2016-01-19 DIAGNOSIS — I059 Rheumatic mitral valve disease, unspecified: Secondary | ICD-10-CM | POA: Diagnosis not present

## 2016-01-19 DIAGNOSIS — Z7901 Long term (current) use of anticoagulants: Secondary | ICD-10-CM

## 2016-01-19 DIAGNOSIS — I63412 Cerebral infarction due to embolism of left middle cerebral artery: Secondary | ICD-10-CM

## 2016-01-19 DIAGNOSIS — R972 Elevated prostate specific antigen [PSA]: Secondary | ICD-10-CM | POA: Diagnosis not present

## 2016-01-19 DIAGNOSIS — I4892 Unspecified atrial flutter: Secondary | ICD-10-CM | POA: Diagnosis not present

## 2016-01-19 DIAGNOSIS — I10 Essential (primary) hypertension: Secondary | ICD-10-CM

## 2016-01-19 NOTE — Progress Notes (Signed)
STROKE NEUROLOGY FOLLOW UP NOTE  NAME: [name] [name] DOB: 03/12/1940  REASON FOR VISIT: stroke follow up HISTORY FROM: pt and chart  Today we had the pleasure of seeing [name] [name] in follow-up at our Neurology Clinic. Pt was accompanied by no one.   History Summary [name] [name] is a 75 y.o. 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 He was off coumadin for a while as per cardiology before the admission on 08/05/13 due to acute onset expressive aphasia, right facial droop and right-sided numbness. This was completely resolved before ER arrival. Patient was not administered TPA secondary to symptoms resolved. In ER, he still showing in A flutter rhythm. After admission, MRI showed acute left frontal stroke but also with subacute left parietal infarct, consistent with embolic stroke. Cardiology was consulted and agreed with anticoagulation with coumadin. He was discharged with coumadin.   Follow up 10/14/13 - the patient has been doing well. He stated that he had cardioversion with [facility] 3 weeks ago and for a week his heart rhythm is in sinus but for the last two weeks he was still in Afib or Aflutter. He is going to [place] [facility] this week to see if he is a candidate for ablation to treat his heart rhythm but he is aware that he needs to take anticoagulation likely for life. He followed up with INR check and his INR was in good range between 2-3.  Follow up 01/20/14 - pt was doing well. He had ablation procedure done in [place] [facility] last in September. It was successful and he did not have any symptoms of palpitation or CP. His INR check 4 days ago was 2.2. His is compliant with coumadin. BP stable and today 140/82. He said his BP at home around 120s. He has appointment follow up with his cardiologist Dr. [name] next  month and also with his cardiologist in [place] clinic.  Follow up 07/21/14 - pt has been doing well. He had another ablation procedure done during the interval time and currently feels good and no palpitations. BP 123/83, in good control. Last INR 4.4 about 4 days ago.   Follow up 01/19/15 - pt has been doing well. No recurrent stroke like symptoms. Last INR 1.9 and his coumadin increased on dose. But he stated that his INR usually in good control. He is going to [facility] next Monday for follow up after ablation. BP in clinic 131/81.  Interval History During the interval time, pt has been doing well. No recurrent stroke symptoms. Last INR 2.8 and his INR controlled well as per pt. He has been following with cardiology regularly and continued on coumadin. BP today in clinic 128/74.   REVIEW OF SYSTEMS: Full 14 system review of systems performed and notable only for those listed below and in HPI above, all others are negative:  Constitutional: N/A  Cardiovascular:   Ear/Nose/Throat: mild hearing loss  Skin: Eyes: N/A  Respiratory:  Gastroitestinal: N/A  Genitourinary: N/A Hematology/Lymphatic:  Endocrine: N/A  Musculoskeletal: N/A  Allergy/Immunology: N/A  Neurological: N/A  Psychiatric: N/A Sleep: snoring  The following represents the patient's updated allergies and side effects list: Allergies  Allergen Reactions  . Naproxen Hives    Aleve REACTION: Hives    Labs since last visit of relevance include the following: Results for orders placed or performed  in visit on 01/01/16  POCT INR  Result Value Ref Range   INR 2.8     The neurologically relevant items on the patient's problem list were reviewed on today's visit.  Neurologic Examination  A problem focused neurological exam (12 or more points of the single system neurologic examination, vital signs counts as 1 point, cranial nerves count for 8 points) was performed.  Blood pressure 128/74, pulse 62, height 5'  8" (1.727 m), weight 217 lb 9.6 oz (98.7 kg).  General - Well nourished, well developed, in no apparent distress.  Ophthalmologic - Sharp disc margins OU.  Cardiovascular - regular heart rhythm.  Mental Status -  Level of arousal and orientation to time, place, and person were intact. Language including expression, naming, repetition, comprehension, reading, and writing was assessed and found intact.  Cranial Nerves II - XII -  II - Vision intact OU. III, IV, VI - Extraocular movements intact. V - Facial sensation intact bilaterally. VII - Facial movement intact bilaterally. VIII - Hearing & vestibular intact bilaterally. X - Palate elevates symmetrically. XI - Chin turning & shoulder shrug intact bilaterally. XII - Tongue protrusion intact.  Motor Strength - The patient's strength was normal in all extremities and pronator drift was absent.  Bulk was normal and fasciculations were absent.   Motor Tone - Muscle tone was assessed at the neck and appendages and was normal.  Reflexes - The patient's reflexes were normal in all extremities and he had no pathological reflexes.  Sensory - Light touch, temperature/pinprick, vibration and proprioception, and Romberg testing were assessed and were normal.    Coordination - The patient had normal movements in the hands and feet with no ataxia or dysmetria.  Tremor was absent.  Gait and Station - The patient's transfers, posture, gait, station, and turns were observed as normal.  Data reviewed: I personally reviewed the images and agree with the radiology interpretations.  CT of the brain 08/05/2013 1. 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 There is no evidence of an acute infarct. 2. Mild atrophy and chronic microvascular ischemic change. 3. No other abnormalities.  MRI of the brain 08/06/2013 Scattered small acute nonhemorrhagic left frontal lobe infarct. Subacute left parietal lobe infarct with laminar necrosis (less likely  gyriform petechial hemorrhage). Tiny punctate region of blood breakdown products posterior left temporal lobe may reflect result of hemorrhage ischemia without intracranial hemorrhage otherwise noted. Mild to moderate small vessel disease type changes. Mild gyriform enhancement of the subacute left parietal lobe infarct otherwise no evidence of intracranial enhancing lesion or mass identified. Mild mucosal thickening inferior aspect right maxillary sinus. Transverse ligament hypertrophy with minimal anterior slippage of the C1 ring and mild posterior displacement of the dens contributing to moderate spinal stenosis and cord flattening greater on the left.  MRA of the brain 08/06/2013 Small caliber A2 segment of the left anterior cerebral artery with majority of the A2 segment supplied from the right. Anterior circulation without medium or large size vessel significant stenosis or occlusion. Mild middle cerebral artery branch vessel irregularity greater on the left. Right posterior inferior cerebellar artery and anterior inferior cerebellar artery have a single origin from the proximal basilar artery. Similar appearance on the left slightly distal to this level. No significant stenosis of the distal vertebral arteries or basilar artery. Minimal irregularity distal posterior cerebral artery branch vessels.  MRA of the neck 08/06/2013 Ectatic 3 vessel aortic arch. Ectasia great vessels. Mild narrowing proximal right subclavian artery. Minimal narrowing  proximal right vertebral artery. Mild ectasia proximal left vertebral artery. No significant stenosis of the vertebral arteries. No significant stenosis of either carotid artery. Prominent and ectasia of the vertical segment of the internal carotid arteries bilaterally.  2D Echocardiogram EF 55-60% with no source of embolus.  Carotid Doppler No evidence of hemodynamically significant internal carotid artery stenosis. Vertebral artery flow is antegrade.  CXR 08/06/2013 No  acute cardiopulmonary disease.   Assessment: As you may recall, he is a 75 y.o. Caucasian male with a diagnosis of stroke followed up in clinic today. He presented to [facility] on 08/05/13 due to aphasia, right facial droop and right arm numbness. MRI showed acute left frontal stroke with subacute left parietal stroke, consistent with embolic stroke. Given his hx of rheumatic fever with mitral and aortic valve abnormality s/p valvular replacement and on afib with discontinued coumadin by cardiology, his stroke was high likely due to afib not on anticoagulation. Discussed with cardiology, he was put on coumadin. After discharge, he has been follow up with coumadin clinic and INR is on the goal between 2-3. He had cardioversion with cardiology and ablation procedure in [facility] and now he is in NSR. Continue follow up with cardiology here in [place]. Last INR 2.8.    Plan:  - continue coumadin and and lipitor for stroke prevention - goal INR 2-3.  - follow up with cardiologists as scheduled. - Follow up with your primary care physician for stroke risk factor modification. Recommend maintain blood pressure goal <130/80, diabetes with hemoglobin A1c goal below 6.5% and lipids with LDL cholesterol goal below 70 mg/dL.  - check BP at home - follow up as needed.    Patient Instructions  - continue coumadin and lipitor for stroke prevention. - goal INR 2-3.  - follow up with cardiologists as scheduled. - Follow up with your primary care physician for stroke risk factor modification. Recommend maintain blood pressure goal <130/80, diabetes with hemoglobin A1c goal below 6.5% and lipids with LDL cholesterol goal below 70 mg/dL.  - check BP at home. - follow up as needed.  [name] Hawking, MD PhD [facility] [address], [place], [place] [address] [phone]

## 2016-01-19 NOTE — Patient Instructions (Signed)
-   continue coumadin and lipitor for stroke prevention. - goal INR 2-3.  - follow up with cardiologists as scheduled. - Follow up with your primary care physician for stroke risk factor modification. Recommend maintain blood pressure goal <130/80, diabetes with hemoglobin A1c goal below 6.5% and lipids with LDL cholesterol goal below 70 mg/dL.  - check BP at home. - follow up as needed.

## 2016-02-02 ENCOUNTER — Ambulatory Visit (INDEPENDENT_AMBULATORY_CARE_PROVIDER_SITE_OTHER): Payer: Medicare Other | Admitting: Pharmacist

## 2016-02-02 DIAGNOSIS — N5201 Erectile dysfunction due to arterial insufficiency: Secondary | ICD-10-CM | POA: Diagnosis not present

## 2016-02-02 DIAGNOSIS — I4892 Unspecified atrial flutter: Secondary | ICD-10-CM

## 2016-02-02 DIAGNOSIS — I63412 Cerebral infarction due to embolism of left middle cerebral artery: Secondary | ICD-10-CM

## 2016-02-02 DIAGNOSIS — Z5181 Encounter for therapeutic drug level monitoring: Secondary | ICD-10-CM

## 2016-02-02 DIAGNOSIS — I4891 Unspecified atrial fibrillation: Secondary | ICD-10-CM

## 2016-02-02 DIAGNOSIS — R972 Elevated prostate specific antigen [PSA]: Secondary | ICD-10-CM | POA: Diagnosis not present

## 2016-02-02 DIAGNOSIS — N400 Enlarged prostate without lower urinary tract symptoms: Secondary | ICD-10-CM | POA: Diagnosis not present

## 2016-02-02 LAB — POCT INR: INR: 3.7

## 2016-02-19 ENCOUNTER — Ambulatory Visit (INDEPENDENT_AMBULATORY_CARE_PROVIDER_SITE_OTHER): Payer: Medicare Other

## 2016-02-19 DIAGNOSIS — I4892 Unspecified atrial flutter: Secondary | ICD-10-CM | POA: Diagnosis not present

## 2016-02-19 DIAGNOSIS — Z5181 Encounter for therapeutic drug level monitoring: Secondary | ICD-10-CM | POA: Diagnosis not present

## 2016-02-19 DIAGNOSIS — I4891 Unspecified atrial fibrillation: Secondary | ICD-10-CM

## 2016-02-19 DIAGNOSIS — I63412 Cerebral infarction due to embolism of left middle cerebral artery: Secondary | ICD-10-CM

## 2016-02-19 LAB — POCT INR: INR: 2.8

## 2016-03-14 DIAGNOSIS — D1801 Hemangioma of skin and subcutaneous tissue: Secondary | ICD-10-CM | POA: Diagnosis not present

## 2016-03-14 DIAGNOSIS — D225 Melanocytic nevi of trunk: Secondary | ICD-10-CM | POA: Diagnosis not present

## 2016-03-14 DIAGNOSIS — L821 Other seborrheic keratosis: Secondary | ICD-10-CM | POA: Diagnosis not present

## 2016-03-14 DIAGNOSIS — L812 Freckles: Secondary | ICD-10-CM | POA: Diagnosis not present

## 2016-03-14 DIAGNOSIS — D692 Other nonthrombocytopenic purpura: Secondary | ICD-10-CM | POA: Diagnosis not present

## 2016-03-14 DIAGNOSIS — L570 Actinic keratosis: Secondary | ICD-10-CM | POA: Diagnosis not present

## 2016-03-15 ENCOUNTER — Telehealth: Payer: Self-pay | Admitting: Cardiology

## 2016-03-15 NOTE — Telephone Encounter (Signed)
See phone note from earlier today. 

## 2016-03-15 NOTE — Telephone Encounter (Signed)
°  New Prob  Calling regarding his part B coverage. States it has removed dronedarone (MULTAQ) 400 MG tablet from list of covered medications. Requesting to speak to a nurse.

## 2016-03-15 NOTE — Telephone Encounter (Signed)
I discussed with patient.  Pt is going to contact his insurance company to find out more about appeal process for Multaq and if appeal is successful how much the medication would cost him. Pt knows to call back if he needs help/information for appeal process.   Pt is due to see Dr [name] and would like to continue to see Dr [name] in [facility], I will forward message to [facility] to contact pt to schedule an appointment for pt with Dr [name].

## 2016-03-15 NOTE — Telephone Encounter (Signed)
°  Follow Up  Pt states he spoke with you earlier and would like to speak with you again to provided requested information. Please call.

## 2016-03-15 NOTE — Telephone Encounter (Signed)
I would recommend Multaq as it has been effective and has less side effects compared to amiodarone.  Would try to appeal to get him Multaq.  If that does not work, can switch to amiodarone but will need to follow LFTs, TSH, lung functions, etc.  It will likely be at least as effective as Multaq.  He can see me in followup in the [facility] clinic when due.

## 2016-03-15 NOTE — Telephone Encounter (Signed)
Pt states Multaq has been removed from his prescription formulary as of January 1,2018. Pt states he has been told by insurance that amiodarone would be an alternative. Pt asking if Dr [name] would recommend changing from Multaq to amiodarone. Pt is concerned about changing medications because he has maintained SR on Multaq and is concerned that amiodarone may not be as effective. Pt states he would agree with Dr Claris Gladden recommendation if Dr Claris Gladden felt amiodarone would be a reasonable alternative to Multaq. Pt states he does not recall ever taking amiodarone. Pt states out of pocket cost for Multaq would be $720/month.  Pt states there is an appeal process with his insurance company if Dr [name] recommends continuing Multaq.  I will forward to Dr [name] for review.

## 2016-03-15 NOTE — Telephone Encounter (Signed)
Pt providing information for requesting exception to cover Multaq for 2018[facility] [phone], confirmation number for request for exception to cover Multaq for 2018 [place] [id number], member number [id number].  Pt states [facility] requesting Dr [name] contact them at [phone] to request exception to cover Multaq for year 2018.  Pt advised I will forward to [place] for help with getting exception to cover Multaq for year 2018.

## 2016-03-16 ENCOUNTER — Telehealth: Payer: Self-pay

## 2016-03-16 NOTE — Telephone Encounter (Signed)
Prior auth for Multaq 400mg  submitted to [facility].

## 2016-03-16 NOTE — Telephone Encounter (Signed)
Prior auth submitted to [employer]. Patient aware.

## 2016-03-17 ENCOUNTER — Telehealth: Payer: Self-pay | Admitting: Cardiology

## 2016-03-17 NOTE — Telephone Encounter (Signed)
Routing to [place] who is back in the office tomorrow. She should be able to get the medical appeals person's information.

## 2016-03-17 NOTE — Telephone Encounter (Signed)
Patient called and said that he has been speaking to Webb Silversmith about his insurance not covering his Multaq starting the beginning of this year. Patient states that Webb Silversmith has been working with Vaughan Basta to get this problem taken care of. He states that he has been notified today again that his insurance will not cover this medication despite the information submitted by Vaughan Basta . Patient states that he has been on this medication for the past 13 years and it is working for him and he does not want to have to switch to something different. Patient states that he only has enough medication to get him through the end of this month and that he would like to take care of this before he runs out. Patient states that he would like to have Dr. [name] involved and would actually like to speak to him directly. I informed him that Dr. [name] is now at the CHF clinic now but I would route him this message. I let him know that Webb Silversmith would be in the office tomorrow and that I would send a message to her and Vaughan Basta as well since they have been working on this. Patient is in agreement with this plan and states that he would liked to be called on his cell at [phone]. I let the patient know that I would send the message and if he did not here from [place] by the beginning of next week to give [place] a call back. Patient verbalized understanding and was in agreement with this plan.

## 2016-03-17 NOTE — Telephone Encounter (Signed)
[name] [name], could you please get me connected to a medical appeals person at his insurance to see if I can convince them to let [name] continue Multaq? Thanks.

## 2016-03-17 NOTE — Telephone Encounter (Signed)
New Message: ° ° ° ° °Please call. °

## 2016-03-17 NOTE — Telephone Encounter (Signed)
New Message:   Pt was told [name] [name] was not in today.He wanted  to talk to [name] [name] about his Multaq. He did not want to talk to anybody but Anne,he said she was familiar with the issues.I offered him a triage nurse twice.

## 2016-03-18 ENCOUNTER — Ambulatory Visit (INDEPENDENT_AMBULATORY_CARE_PROVIDER_SITE_OTHER): Payer: Medicare Other | Admitting: *Deleted

## 2016-03-18 DIAGNOSIS — I4891 Unspecified atrial fibrillation: Secondary | ICD-10-CM | POA: Diagnosis not present

## 2016-03-18 DIAGNOSIS — I4892 Unspecified atrial flutter: Secondary | ICD-10-CM

## 2016-03-18 DIAGNOSIS — Z5181 Encounter for therapeutic drug level monitoring: Secondary | ICD-10-CM

## 2016-03-18 LAB — POCT INR: INR: 2.1

## 2016-03-18 NOTE — Telephone Encounter (Signed)
Spoke with patient today. Explained that I would send Dr. [name] all the pertinent info for his appeal. He is a bit anxious, and does not want to start a new drug.

## 2016-03-18 NOTE — Telephone Encounter (Signed)
To do an expedited appeal, the number is [phone] [facility]) They will ask for our Tax ID. It is [id number]. You will then get a Menu that has option for a Peer to Peer review. His ID # [id number].  Request reference # [id number] ( case no.) I will fax this denial paperwork to you as well.

## 2016-03-22 NOTE — Telephone Encounter (Signed)
I spent 45 minutes on the phone trying to get this patient his Multaq.  Vaughan Basta, that information you gave me regarding peer to peer does not work.  Please talk to them again and figure out what needs to be done.  Apparently more information has to be faxed in.

## 2016-03-25 NOTE — Telephone Encounter (Signed)
Please let him know what is going on.

## 2016-03-25 NOTE — Telephone Encounter (Signed)
Appeal for Multaq documentation sent to [employer] [facility] Appeals /Grievances today.

## 2016-03-25 NOTE — Telephone Encounter (Signed)
Patient notified of this.

## 2016-03-30 ENCOUNTER — Telehealth: Payer: Self-pay | Admitting: Physician Assistant

## 2016-03-30 NOTE — Telephone Encounter (Signed)
New Message     Returning [name] [name] call about prescription problem

## 2016-04-01 ENCOUNTER — Telehealth: Payer: Self-pay

## 2016-04-01 NOTE — Telephone Encounter (Signed)
Done

## 2016-04-01 NOTE — Telephone Encounter (Signed)
Please let him know

## 2016-04-01 NOTE — Telephone Encounter (Signed)
Noted  

## 2016-04-01 NOTE — Telephone Encounter (Signed)
Multaq approved through 12/31/208.

## 2016-04-01 NOTE — Telephone Encounter (Signed)
Follow Up  Pt called to notify us that he received letter from [employer] approving [place] through 03/06/17.

## 2016-04-13 ENCOUNTER — Other Ambulatory Visit: Payer: Self-pay | Admitting: Cardiology

## 2016-04-14 ENCOUNTER — Ambulatory Visit (INDEPENDENT_AMBULATORY_CARE_PROVIDER_SITE_OTHER): Payer: Medicare Other | Admitting: Pharmacist

## 2016-04-14 DIAGNOSIS — I4892 Unspecified atrial flutter: Secondary | ICD-10-CM | POA: Diagnosis not present

## 2016-04-14 LAB — POCT INR: INR: 1.9

## 2016-04-20 ENCOUNTER — Ambulatory Visit (HOSPITAL_COMMUNITY)
Admission: RE | Admit: 2016-04-20 | Discharge: 2016-04-20 | Disposition: A | Discharge status: Home / Self Care | Payer: Medicare Other | Source: Ambulatory Visit | Attending: Cardiology | Admitting: Cardiology

## 2016-04-20 ENCOUNTER — Encounter (HOSPITAL_COMMUNITY): Payer: Self-pay

## 2016-04-20 VITALS — BP 132/76 | HR 73 | Ht 68.0 in | Wt 204.0 lb

## 2016-04-20 DIAGNOSIS — I059 Rheumatic mitral valve disease, unspecified: Secondary | ICD-10-CM | POA: Diagnosis not present

## 2016-04-20 DIAGNOSIS — I482 Chronic atrial fibrillation, unspecified: Secondary | ICD-10-CM

## 2016-04-20 DIAGNOSIS — E785 Hyperlipidemia, unspecified: Secondary | ICD-10-CM | POA: Insufficient documentation

## 2016-04-20 LAB — LIPID PANEL
CHOL/HDL RATIO: 2.4 ratio
Cholesterol: 105 mg/dL (ref 0–200)
HDL: 43 mg/dL (ref >40)
LDL CALC: 42 mg/dL (ref 0–99)
Triglycerides: 101 mg/dL (ref <150)
VLDL: 20 mg/dL (ref 0–40)

## 2016-04-20 LAB — CBC
HCT: 38.8 % — ABNORMAL LOW (ref 39.0–52.0)
Hemoglobin: 13.2 g/dL (ref 13.0–17.0)
MCH: 32 pg (ref 26.0–34.0)
MCHC: 34 g/dL (ref 30.0–36.0)
MCV: 93.9 fL (ref 78.0–100.0)
PLATELETS: 244 10*3/uL (ref 150–400)
RBC: 4.13 MIL/uL — ABNORMAL LOW (ref 4.22–5.81)
RDW: 13.2 % (ref 11.5–15.5)
WBC: 10.9 10*3/uL — ABNORMAL HIGH (ref 4.0–10.5)

## 2016-04-20 LAB — BASIC METABOLIC PANEL
Anion gap: 10 (ref 5–15)
BUN: 19 mg/dL (ref 6–20)
CALCIUM: 9.1 mg/dL (ref 8.9–10.3)
CO2: 24 mmol/L (ref 22–32)
Chloride: 105 mmol/L (ref 101–111)
Creatinine, Ser: 1.48 mg/dL — ABNORMAL HIGH (ref 0.61–1.24)
GFR calc Af Amer: 52 mL/min — ABNORMAL LOW (ref >60)
GFR, EST NON AFRICAN AMERICAN: 45 mL/min — AB (ref >60)
GLUCOSE: 115 mg/dL — AB (ref 65–99)
Potassium: 4.4 mmol/L (ref 3.5–5.1)
Sodium: 139 mmol/L (ref 135–145)

## 2016-04-20 MED ORDER — DRONEDARONE HCL 400 MG PO TABS: ORAL_TABLET | ORAL | 3 refills | Status: DC

## 2016-04-20 MED ORDER — ATORVASTATIN CALCIUM 80 MG PO TABS: ORAL_TABLET | ORAL | 3 refills | Status: DC

## 2016-04-20 MED ORDER — LOSARTAN POTASSIUM 25 MG PO TABS
12.5000 mg | ORAL_TABLET | Freq: Every day | ORAL | 3 refills | Status: DC
Start: 2016-04-20 — End: 2016-06-16

## 2016-04-20 MED ORDER — LOSARTAN POTASSIUM 25 MG PO TABS: 12.5000 mg | ORAL_TABLET | Freq: Every day | ORAL | 3 refills | Status: DC

## 2016-04-20 NOTE — Patient Instructions (Signed)
Labs today (will call for abnormal results, otherwise no news is good news)  Follow up in 6 months 

## 2016-04-21 NOTE — Progress Notes (Signed)
Patient ID: [name] [name], male   DOB: Feb 22, 1941, 76 y.o.   MRN: [id number] PCP: Dr. Maudie Mercury Cardiology: Dr. [name]  76 yo with history of rheumatic fever and AI/[name] s/p bioprosthetic AVR and MVR at the [facility] as well as MAZE procedure all in 2006 presents for cardiology evaluation.  He also has an ascending aortic aneurysm.   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  I started him on dronedarone and coumadin with plan for cardioversion after 4 wks of therapeutic anticoagulation if he did not convert back to NSR on his own.  He went back into NSR spontaneously and later stopped coumadin. Atrial flutter recurred in 3/15 and again resolved spontaneously.  In 6/15, patient was admitted with transient dysarthria (resolved in about 10 minutes).  However, he was found to have evidence for CVA by MRI.  Therefore, he was started on coumadin. He was noted to be in atypical atrial flutter again.  Ranolazine was added to dronedarone.    In 8/15, [name] [name] was seen in consultation at the [facility] clinic.  He had atrial flutter ablation at [facility] in 11/15.    He was seen in followup at [facility] in 2/16.  Ranolazine was stopped.  Echo in 2/16 showed EF 55% with stable bioprosthetic mitral and aortic valves.  CTA chest showed stable 5 cm ascending aortic aneurysm.  He went back into atypical atrial flutter in 4/16.  Ranolazine was restarted and he was cardioverted back to NSR.    Last visit to [facility] was in 12/16.  Echo at that time showed EF 58% with stable bioprosthetic mitral and aortic valves.  CTA showed 4.9 cm aortic root at sinuses of valsalva.  Today, patient remains in NSR.  He has had no further neurological symptoms.  No exertional dyspnea or chest pain.  He is working out at [employer].  No lightheadedness or syncope.  He generally feels good. No palpitations, does not feel like he has been out of rhythm. No BRBPR or melena.    ECG (12/12/11): Atypical atrial flutter versus atrial tachycardia with rate around 100 bpm, QTc normal.  ECG (12/29/11): NSR, 1st degree AV block PR 242 msec.    ECG (12/13): NSR, 1st degree AV block PR 244 msec    ECG (10/14): NSR, 1st degree AV block ECG (3/15): Atypical atrial flutter with rate 87, inferior T wave inversions ECG (6/15): Atypical atrial flutter with rate 77 ECG (8/15): atypical atrial flutter rate 82 ECG (10/15): NSR, 1st degree AV block ECG (12/15): NSR, 1st degree AV block, QTc 410 msec ECG (5/16): NSR, 1st degree AV block, QTc 419 msec ECG (5/17): NSR, 1st degree AV block 270 msec, QTc 433 ECG (2/18): NSR, 1st degree AVB, nonspecific ST-T changes, QTc 435 msec  Labs (3/14): LDL 64, TSH normal, K 3.9, creatinine 1.0     Labs (6/15): LDL 54, HDL 48, creatinine 0.95   Labs (9/15): LDL 54, HDL 25 Labs (10/15): K 4.5, creatinine 1.3, AST 45, ALT 64 Labs (4/16): K 4.3, creatinine 1.28, HCT 40.7 Labs (5/17): K 4.1, creatinine 1.16, LDL 66, HDL 47,  PMH: 1. Atrial fibrillation: s/p MAZE and LAA ligation in 2006.  No documented atrial fibrillation since operation.  2. Atypical atrial flutter: 10/13, 3/15, and again in 6/15.  Recurrent 4/16 with DCCV.  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   - Echo 5/11 with EF 60-65%, normal bioprosthetic aortic valve, normal bioprosthetic mitral valve, moderate LAE.   - Echo 11/12 at [facility] looked ok per report.  - Echo (10/13) with EF 55-60%, mild LVH, normal bioprosthetic aortic and mitral valves, moderate LAE, PA systolic pressure 35 mmHg.   - Echo (6/15) with EF 60%, moderate LVH, normal-appearing bioprosthetic MV and AoV, PA systolic pressure 32 mmHg.   - Echo 9/15 [facility]) with EF 55%, mild LVH, PASP 52 mmHg, severe  LAE, bioprosthetic mitral valve with no [name] and mean gradient 11 mmHg, moderate TR, bioprosthetic aortic valve with no AI and mean gradient 9 mmHg, dilated ascending aorta to 5 cm.   - Echo (2/16, [facility]) with EF 55%, stable bioprosthetic aortic and mitral valves (AoV mean gradient 7 mmHg, MV mean gradient 4 mmHg), normal RV size and systolic function.  - Echo (12/16, [facility]) with EF 58%, mild RV dilation with low normal systolic function, PA systolic pressure 34 mmHg, bioprosthetic MV and AoV functioning normally.  4. Hyperlipidemia 5. L-spine surgery 6. LHC with no significant coronary disease in 2006 pre-op.  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  CTA chest 9/15 with dilated sinuses of valsalva to 5.0 cm and ascending aorta 4.9 cm. CTA chest (2/16) with 5.0 cm ascending aorta.  CTA chest (12/16) with 4.9 cm aortic root at SOV, 4.8 cm ascending aorta.   SH: Married, retired Forensic psychologist [name], [place], [place]).  2 sons.  Never smoked.   FH: No premature CAD.   ROS: All systems reviewed and negative except as per HPI.  Current Outpatient Prescriptions  Medication Sig Dispense Refill  . atorvastatin (LIPITOR) 80 MG tablet Take 1 tablet by mouth  daily 90 tablet 3  . clobetasol cream (TEMOVATE) 0.05 % as needed (skin irritation).     . Coenzyme Q10 (SM COENZYME Q-10) 100 MG capsule Take by mouth.    . Coenzyme Q10-Vitamin E 100-300 MG-UNIT WAFR Take by mouth daily.    . Cyanocobalamin (VITAMIN B 12 PO) Take 1 tablet by mouth daily.    [name] dronedarone (MULTAQ) 400 MG tablet Take 1 tablet  by mouth 2  times daily with a meal. 180 tablet 3  . fish oil-omega-3 fatty acids 1000 MG capsule Take 3 g by mouth daily.    . fluocinonide cream (LIDEX) 0.05 % Apply topically as needed (FOR ITCHING).     . hydrocortisone cream (RA ANTI-ITCH MAXIMUM STRENGTH) 1 % Apply 1 application topically  as needed for itching.     . LevOCARNitine (CARNITINE PO) Take 750 mg by mouth daily.    [name] loratadine (CLARITIN) 10 MG tablet Take 10 mg by mouth as needed for allergies.    [name] losartan (COZAAR) 25 MG tablet Take 0.5 tablets (12.5 mg total) by mouth daily. 45 tablet 3  . Magnesium 400 MG TABS Take 400 mg by mouth daily.    . metoprolol succinate (TOPROL-XL) 50 MG 24 hr tablet Take 1 tablet (50 mg total) by mouth 2 (two) times daily at 8 am and 10 pm. 180 tablet 3  .  multivitamin (THERAGRAN) per tablet Take 1 tablet by mouth daily.    . pregabalin (LYRICA) 50 MG capsule Take 1 capsule (50 mg total) by mouth 3 (three) times daily. 63 capsule 0  . Probiotic Product (PROBIOTIC FORMULA PO) Take 2 capsules by mouth daily.     . ranolazine (RANEXA) 500 MG 12 hr tablet Take 1 tablet (500 mg total) by mouth 2 (two) times daily. 180 tablet 3  . saw palmetto 80 MG capsule Take 80 mg by mouth 2 (two) times daily.    . sildenafil (VIAGRA) 100 MG tablet Take 1 tablet (100 mg total) by mouth as needed for erectile dysfunction. 10 tablet 6  . warfarin (COUMADIN) 5 MG tablet Take as directed by Coumadin Clinic 120 tablet 1   No current facility-administered medications for this encounter.     BP 132/76 (BP Location: Right Arm, Patient Position: Sitting, Cuff Size: Normal)   Pulse 73   Ht 5\' 8"  (1.727 m)   Wt 204 lb (92.5 kg)   SpO2 97%   BMI 31.02 kg/m  General: NAD Neck: JVP 7 cm, no thyromegaly or thyroid nodule.  Lungs: Clear to auscultation bilaterally with normal respiratory effort. CV: Nondisplaced PMI.  Heart regular S1/S2, no S3/S4, 2/6 early SEM RUSB.  No peripheral edema.  No carotid bruit.  Normal pedal pulses.  Abdomen: Soft, nontender, no hepatosplenomegaly, no distention.  Neurologic: Alert and oriented x 3.  Psych: Normal affect. Extremities: No clubbing or cyanosis.   Assessment/Plan: 1. Atypical atrial flutter: Patient had a MAZE procedure with LAA ligation at the time of his valve  replacement.  Since then, he has had occasional episodes of atypical atrial flutter. He had episodes in 10/13 and 3/15 that converted back to NSR on dronedarone. He was noted to be back in atrial flutter in 6/15 in the setting of a CVA.  He had ablation at [facility] in 11/15.  Atrial flutter recurred after ranolazine was stopped and he had DCCV again in 4/16.  He remains in NSR today. - Continue coumadin. This will need to be continued long term even with ablation given CVA. Check CBC.  - Continue current Toprol XL. - He will continue dronedarone and ranolazine to maintain NSR.  QTc interval ok today.  He had recurrent atrial flutter when he tried stopping ranolazine.   2. 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  Valve looked stable on 12/16 echo at [facility].  He will go up to the [facility] again in 5/18.  He will get a repeat echo at that time.  3. HTN: BP is reasonably controlled.  4. Hyperlipidemia: Check lipids today.    5. CVA: Suspect this was related to atrial flutter (despite LA appendage ligation).  He is now on coumadin. No residual neurologic deficits.  Continue long-term. 6. Thoracic aortic aneurysm: Dilated aortic root at sinuses of valsalva and ascending aorta to 4.9 cm, last CTA in 12/16 (stable).  He will have repeat CTA in 5/18 when he goes to [facility].   - Continue Toprol XL given some evidence that beta blockers may attenuate thoracic aortic aneurysm growth.  - Continue losartan 12.5 mg daily given some evidence for ARBs attenuating thoracic aortic aneurysm growth.    Followup with me in 6 months.    Loralie Champagne 04/21/2016

## 2016-04-28 ENCOUNTER — Other Ambulatory Visit: Payer: Self-pay | Admitting: Cardiology

## 2016-04-28 DIAGNOSIS — E785 Hyperlipidemia, unspecified: Secondary | ICD-10-CM

## 2016-04-28 DIAGNOSIS — I4892 Unspecified atrial flutter: Secondary | ICD-10-CM

## 2016-05-19 ENCOUNTER — Ambulatory Visit (INDEPENDENT_AMBULATORY_CARE_PROVIDER_SITE_OTHER): Payer: Medicare Other | Admitting: *Deleted

## 2016-05-19 ENCOUNTER — Encounter: Payer: Self-pay | Admitting: Internal Medicine

## 2016-05-19 ENCOUNTER — Ambulatory Visit (INDEPENDENT_AMBULATORY_CARE_PROVIDER_SITE_OTHER): Payer: Medicare Other | Admitting: Internal Medicine

## 2016-05-19 ENCOUNTER — Other Ambulatory Visit (INDEPENDENT_AMBULATORY_CARE_PROVIDER_SITE_OTHER): Payer: Medicare Other

## 2016-05-19 VITALS — BP 136/86 | HR 67 | Temp 98.5°F | Wt 201.0 lb

## 2016-05-19 DIAGNOSIS — D539 Nutritional anemia, unspecified: Secondary | ICD-10-CM

## 2016-05-19 DIAGNOSIS — R739 Hyperglycemia, unspecified: Secondary | ICD-10-CM

## 2016-05-19 DIAGNOSIS — I4892 Unspecified atrial flutter: Secondary | ICD-10-CM

## 2016-05-19 DIAGNOSIS — G8929 Other chronic pain: Secondary | ICD-10-CM | POA: Insufficient documentation

## 2016-05-19 DIAGNOSIS — K648 Other hemorrhoids: Secondary | ICD-10-CM | POA: Diagnosis not present

## 2016-05-19 DIAGNOSIS — Z0000 Encounter for general adult medical examination without abnormal findings: Secondary | ICD-10-CM | POA: Diagnosis not present

## 2016-05-19 DIAGNOSIS — M5440 Lumbago with sciatica, unspecified side: Secondary | ICD-10-CM

## 2016-05-19 LAB — CBC WITH DIFFERENTIAL/PLATELET
BASOS ABS: 0 10*3/uL (ref 0.0–0.1)
BASOS PCT: 0.3 % (ref 0.0–3.0)
EOS PCT: 2.1 % (ref 0.0–5.0)
Eosinophils Absolute: 0.2 10*3/uL (ref 0.0–0.7)
HEMATOCRIT: 38.1 % — AB (ref 39.0–52.0)
Hemoglobin: 12.8 g/dL — ABNORMAL LOW (ref 13.0–17.0)
LYMPHS ABS: 1.9 10*3/uL (ref 0.7–4.0)
LYMPHS PCT: 19.7 % (ref 12.0–46.0)
MCHC: 33.6 g/dL (ref 30.0–36.0)
MCV: 94.6 fl (ref 78.0–100.0)
MONOS PCT: 8.6 % (ref 3.0–12.0)
Monocytes Absolute: 0.8 10*3/uL (ref 0.1–1.0)
NEUTROS ABS: 6.5 10*3/uL (ref 1.4–7.7)
Neutrophils Relative %: 69.3 % (ref 43.0–77.0)
PLATELETS: 330 10*3/uL (ref 150.0–400.0)
RBC: 4.02 Mil/uL — ABNORMAL LOW (ref 4.22–5.81)
RDW: 13.5 % (ref 11.5–15.5)
WBC: 9.4 10*3/uL (ref 4.0–10.5)

## 2016-05-19 LAB — FERRITIN: Ferritin: 226.4 ng/mL (ref 22.0–322.0)

## 2016-05-19 LAB — IBC PANEL
Iron: 71 ug/dL (ref 42–165)
Saturation Ratios: 24.3 % (ref 20.0–50.0)
TRANSFERRIN: 209 mg/dL — AB (ref 212.0–360.0)

## 2016-05-19 LAB — FOLATE: FOLATE: 22.5 ng/mL (ref >5.9)

## 2016-05-19 LAB — VITAMIN B12: Vitamin B-12: >1500 pg/mL — ABNORMAL HIGH (ref 211–911)

## 2016-05-19 LAB — POCT INR: INR: 3.3

## 2016-05-19 MED ORDER — HYDROCORTISONE ACETATE 25 MG RE SUPP: 25.0000 mg | Freq: Two times a day (BID) | RECTAL | 3 refills | Status: DC

## 2016-05-19 MED ORDER — METAXALONE 800 MG PO TABS: 800.0000 mg | ORAL_TABLET | Freq: Three times a day (TID) | ORAL | 2 refills | Status: DC

## 2016-05-19 NOTE — Progress Notes (Signed)
Pre visit review using our clinic review tool, if applicable. No additional management support is needed unless otherwise documented below in the visit note. 

## 2016-05-19 NOTE — Progress Notes (Signed)
Subjective:  Patient ID: [name] [name], male    DOB: 1941-01-11  Age: 76 y.o. MRN: [id number]  CC: Medicare Wellness; Anemia; and Back Pain   HPI [name] [name] presents for an AWV/CPX.  He complains of intermittent hemorrhoidal bleeding and discomfort and wants to try a hydrocortisone suppository. He also has low back pain and is getting symptom relief with Skelaxin. He requests a refill. He otherwise feels well and offers no other complaints.  Past Medical History:  Diagnosis Date  . Acute rheumatic heart disease, unspecified    childhood, age  68 & 88  . Acute rheumatic pericarditis   . Atrial fibrillation ([place])    history  . Diverticulosis   . External hemorrhoids without mention of complication   . 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 Lesion of ulnar nerve   . 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 Other and unspecified hyperlipidemia   . Previous back surgery 1978, jan 2007  . Psychosexual dysfunction with inhibited sexual excitement   . SOB (shortness of breath)    "with heavy exercise"  . Stroke [facility])    Past Surgical History:  Procedure Laterality Date  . AORTIC AND MITRAL VALVE REPLACEMENT     09/2004  . CARDIOVERSION     3 times from 2004-2006  . CARDIOVERSION N/A 09/26/2013   Procedure: CARDIOVERSION;  Surgeon: Larey Dresser, MD;  Location: [facility];  Service: Cardiovascular;  Laterality: N/A;  . CARDIOVERSION N/A 06/19/2014   Procedure: CARDIOVERSION;  Surgeon: Jerline Pain, MD;  Location: [facility] ENDOSCOPY;  Service: Cardiovascular;  Laterality: N/A;  . COLONOSCOPY    . laminectomies     10/1975 and in 03/2005  . TONSILLECTOMY     1950    reports that he has never smoked. He has never used smokeless tobacco. He reports that he drinks about 1.8 oz of alcohol per week . He reports that he does not use drugs. family history includes Cancer in his father; Diabetes in his brother and paternal aunt; Heart attack in his maternal  grandfather; Macular degeneration in his mother. Allergies  Allergen Reactions  . Naproxen Hives    Aleve REACTION: Hives    Outpatient Medications Prior to Visit  Medication Sig Dispense Refill  . atorvastatin (LIPITOR) 80 MG tablet Take 1 tablet by mouth  daily 90 tablet 3  . clobetasol cream (TEMOVATE) 0.05 % as needed (skin irritation).     . Cyanocobalamin (VITAMIN B 12 PO) Take 1 tablet by mouth daily.    [name] dronedarone (MULTAQ) 400 MG tablet Take 1 tablet  by mouth 2  times daily with a meal. 180 tablet 3  . fish oil-omega-3 fatty acids 1000 MG capsule Take 3 g by mouth daily.    . hydrocortisone cream (RA ANTI-ITCH MAXIMUM STRENGTH) 1 % Apply 1 application topically as needed for itching.     . LevOCARNitine (CARNITINE PO) Take 750 mg by mouth daily.    [name] loratadine (CLARITIN) 10 MG tablet Take 10 mg by mouth as needed for allergies.    [name] losartan (COZAAR) 25 MG tablet Take 0.5 tablets (12.5 mg total) by mouth daily. 45 tablet 3  . Magnesium 400 MG TABS Take 400 mg by mouth daily.    . metoprolol succinate (TOPROL-XL) 50 MG 24 hr tablet TAKE 1 TABLET BY MOUTH 2  TIMES DAILY AT 8 AM AND 10  PM. 180 tablet 3  . pregabalin (  LYRICA) 50 MG capsule Take 1 capsule (50 mg total) by mouth 3 (three) times daily. 63 capsule 0  . Probiotic Product (PROBIOTIC FORMULA PO) Take 2 capsules by mouth daily.     . ranolazine (RANEXA) 500 MG 12 hr tablet Take 1 tablet (500 mg total) by mouth 2 (two) times daily. 180 tablet 3  . sildenafil (VIAGRA) 100 MG tablet Take 1 tablet (100 mg total) by mouth as needed for erectile dysfunction. 10 tablet 6  . warfarin (COUMADIN) 5 MG tablet TAKE AS DIRECTED BY  COUMADIN CLINIC 120 tablet 1  . Coenzyme Q10 (SM COENZYME Q-10) 100 MG capsule Take by mouth.    . Coenzyme Q10-Vitamin E 100-300 MG-UNIT WAFR Take by mouth daily.    . fluocinonide cream (LIDEX) 0.05 % Apply topically as needed (FOR ITCHING).     [name] multivitamin (THERAGRAN) per tablet Take 1 tablet by  mouth daily.    . saw palmetto 80 MG capsule Take 80 mg by mouth 2 (two) times daily.     No facility-administered medications prior to visit.     ROS Review of Systems  Constitutional: Negative.  Negative for appetite change, diaphoresis, fatigue and unexpected weight change.  HENT: Negative.   Eyes: Negative for visual disturbance.  Respiratory: Negative for cough, chest tightness, shortness of breath and wheezing.   Cardiovascular: Negative for chest pain, palpitations and leg swelling.  Gastrointestinal: Positive for anal bleeding. Negative for abdominal pain, blood in stool, constipation, diarrhea, nausea and vomiting.  Endocrine: Negative.  Negative for cold intolerance and heat intolerance.  Genitourinary: Negative.  Negative for difficulty urinating, frequency, genital sores, scrotal swelling, testicular pain and urgency.  Musculoskeletal: Negative.  Negative for back pain, joint swelling, myalgias and neck pain.  Skin: Negative.  Negative for color change and rash.  Allergic/Immunologic: Negative.   Neurological: Negative.  Negative for dizziness, syncope, weakness, light-headedness and headaches.  Hematological: Negative.  Negative for adenopathy. Does not bruise/bleed easily.  Psychiatric/Behavioral: Negative.     Objective:  BP 136/86 (BP Location: Left Arm, Patient Position: Sitting)   Pulse 67   Temp 98.5 F (36.9 C) (Oral)   Wt 201 lb (91.2 kg)   SpO2 98%   BMI 30.56 kg/m   BP Readings from Last 3 Encounters:  05/19/16 136/86  04/20/16 132/76  01/19/16 128/74    Wt Readings from Last 3 Encounters:  05/19/16 201 lb (91.2 kg)  04/20/16 204 lb (92.5 kg)  01/19/16 217 lb 9.6 oz (98.7 kg)    Physical Exam  Constitutional: He is oriented to person, place, and time. No distress.  HENT:  Mouth/Throat: Oropharynx is clear and moist. No oropharyngeal exudate.  Eyes: Conjunctivae are normal. Right eye exhibits no discharge. Left eye exhibits no discharge. No  scleral icterus.  Neck: Normal range of motion. Neck supple. No JVD present. No tracheal deviation present. No thyromegaly present.  Cardiovascular: Normal rate, regular rhythm, normal heart sounds and intact distal pulses.  Exam reveals no gallop and no friction rub.   No murmur heard. Pulmonary/Chest: Effort normal and breath sounds normal. No stridor. No respiratory distress. He has no wheezes. He has no rales. He exhibits no tenderness.  Abdominal: Soft. Bowel sounds are normal. He exhibits no distension and no mass. There is no tenderness. There is no rebound and no guarding.  Genitourinary: Rectal exam shows internal hemorrhoid. Rectal exam shows no external hemorrhoid, no fissure, no mass, no tenderness, anal tone normal and guaiac negative stool. Prostate is enlarged (  3+ smooth symm BPH). Prostate is not tender.  Genitourinary Comments: There are uncomplicated internal anal hemorrhoids  Musculoskeletal: Normal range of motion. He exhibits no edema, tenderness or deformity.  Lymphadenopathy:    He has no cervical adenopathy.  Neurological: He is oriented to person, place, and time.  Skin: Skin is warm and dry. No rash noted. He is not diaphoretic. No erythema. No pallor.  Psychiatric: He has a normal mood and affect. His behavior is normal. Judgment and thought content normal.  Vitals reviewed.   Lab Results  Component Value Date   WBC 9.4 05/19/2016   HGB 12.8 (L) 05/19/2016   HCT 38.1 (L) 05/19/2016   PLT 330.0 05/19/2016   GLUCOSE 115 (H) 04/20/2016   CHOL 105 04/20/2016   TRIG 101 04/20/2016   HDL 43 04/20/2016   LDLCALC 42 04/20/2016   ALT 45 02/07/2014   AST 35 02/07/2014   NA 139 04/20/2016   K 4.4 04/20/2016   CL 105 04/20/2016   CREATININE 1.48 (H) 04/20/2016   BUN 19 04/20/2016   CO2 24 04/20/2016   TSH 3.060 11/26/2013   PSA 10.30 (H) 12/19/2013   INR 3.3 05/19/2016   HGBA1C 6.1 12/19/2013    No results found.  Assessment & Plan:   [name] [name] was seen today  for medicare wellness, anemia and back pain.  Diagnoses and all orders for this visit:  Hyperglycemia- he is prediabetic, no medications are needed to treat this.  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 -     CBC with Differential/Platelet; Future -     Vitamin B12; Future -     IBC panel; Future -     Ferritin; Future -     Folate; Future  Internal hemorrhoid, bleeding -     hydrocortisone (ANUSOL-HC) 25 MG suppository; Place 1 suppository (25 mg total) rectally 2 (two) times daily.  Chronic left-sided low back pain with sciatica, sciatica laterality unspecified -     metaxalone (SKELAXIN) 800 MG tablet; Take 1 tablet (800 mg total) by mouth 3 (three) times daily.   I have discontinued Mr. Clowers Coenzyme Q10-Vitamin E, multivitamin, saw palmetto, fluocinonide cream, and Coenzyme Q10. I am also having him start on hydrocortisone and metaxalone. Additionally, I am having him maintain his fish oil-omega-3 fatty acids, Probiotic Product (PROBIOTIC FORMULA PO), loratadine, Cyanocobalamin (VITAMIN B 12 PO), hydrocortisone cream, clobetasol cream, LevOCARNitine (CARNITINE PO), Magnesium, pregabalin, sildenafil, ranolazine, atorvastatin, losartan, dronedarone, warfarin, and metoprolol succinate.  Meds ordered this encounter  Medications  . hydrocortisone (ANUSOL-HC) 25 MG suppository    Sig: Place 1 suppository (25 mg total) rectally 2 (two) times daily.    Dispense:  12 suppository    Refill:  3  . metaxalone (SKELAXIN) 800 MG tablet    Sig: Take 1 tablet (800 mg total) by mouth 3 (three) times daily.    Dispense:  90 tablet    Refill:  2   See AVS for instructions about healthy living and anticipatory guidance.  Follow-up: Return in about 6 months (around 11/19/2016).  Scarlette Calico, MD

## 2016-05-19 NOTE — Patient Instructions (Signed)
 Health Maintenance, Male A healthy lifestyle and preventive care is important for your health and wellness. Ask your health care provider about what schedule of regular examinations is right for you. What should I know about weight and diet?  Eat a Healthy Diet  Eat plenty of vegetables, fruits, whole grains, low-fat dairy products, and lean protein.  Do not eat a lot of foods high in solid fats, added sugars, or salt. Maintain a Healthy Weight  Regular exercise can help you achieve or maintain a healthy weight. You should:  Do at least 150 minutes of exercise each week. The exercise should increase your heart rate and make you sweat (moderate-intensity exercise).  Do strength-training exercises at least twice a week. Watch Your Levels of Cholesterol and Blood Lipids  Have your blood tested for lipids and cholesterol every 5 years starting at 74 years of age. If you are at high risk for heart disease, you should start having your blood tested when you are 74 years old. You may need to have your cholesterol levels checked more often if:  Your lipid or cholesterol levels are high.  You are older than 74 years of age.  You are at high risk for heart disease. What should I know about cancer screening? Many types of cancers can be detected early and may often be prevented. Lung Cancer  You should be screened every year for lung cancer if:  You are a current smoker who has smoked for at least 30 years.  You are a former smoker who has quit within the past 15 years.  Talk to your health care provider about your screening options, when you should start screening, and how often you should be screened. Colorectal Cancer  Routine colorectal cancer screening usually begins at 74 years of age and should be repeated every 5-10 years until you are 75 years old. You may need to be screened more often if early forms of precancerous polyps or small growths are found. Your health care provider  may recommend screening at an earlier age if you have risk factors for colon cancer.  Your health care provider may recommend using home test kits to check for hidden blood in the stool.  A small camera at the end of a tube can be used to examine your colon (sigmoidoscopy or colonoscopy). This checks for the earliest forms of colorectal cancer. Prostate and Testicular Cancer  Depending on your age and overall health, your health care provider may do certain tests to screen for prostate and testicular cancer.  Talk to your health care provider about any symptoms or concerns you have about testicular or prostate cancer. Skin Cancer  Check your skin from head to toe regularly.  Tell your health care provider about any new moles or changes in moles, especially if:  There is a change in a mole's size, shape, or color.  You have a mole that is larger than a pencil eraser.  Always use sunscreen. Apply sunscreen liberally and repeat throughout the day.  Protect yourself by wearing long sleeves, pants, a wide-brimmed hat, and sunglasses when outside. What should I know about heart disease, diabetes, and high blood pressure?  If you are 18-39 years of age, have your blood pressure checked every 3-5 years. If you are 40 years of age or older, have your blood pressure checked every year. You should have your blood pressure measured twice-once when you are at a hospital or clinic, and once when you are not at   a hospital or clinic. Record the average of the two measurements. To check your blood pressure when you are not at a hospital or clinic, you can use:  An automated blood pressure machine at a pharmacy.  A home blood pressure monitor.  Talk to your health care provider about your target blood pressure.  If you are between 45-79 years old, ask your health care provider if you should take aspirin to prevent heart disease.  Have regular diabetes screenings by checking your fasting blood sugar  level.  If you are at a normal weight and have a low risk for diabetes, have this test once every three years after the age of 45.  If you are overweight and have a high risk for diabetes, consider being tested at a younger age or more often.  A one-time screening for abdominal aortic aneurysm (AAA) by ultrasound is recommended for men aged 65-75 years who are current or former smokers. What should I know about preventing infection? Hepatitis B  If you have a higher risk for hepatitis B, you should be screened for this virus. Talk with your health care provider to find out if you are at risk for hepatitis B infection. Hepatitis C  Blood testing is recommended for:  Everyone born from 1945 through 1965.  Anyone with known risk factors for hepatitis C. Sexually Transmitted Diseases (STDs)  You should be screened each year for STDs including gonorrhea and chlamydia if:  You are sexually active and are younger than 74 years of age.  You are older than 74 years of age and your health care provider tells you that you are at risk for this type of infection.  Your sexual activity has changed since you were last screened and you are at an increased risk for chlamydia or gonorrhea. Ask your health care provider if you are at risk.  Talk with your health care provider about whether you are at high risk of being infected with HIV. Your health care provider may recommend a prescription medicine to help prevent HIV infection. What else can I do?  Schedule regular health, dental, and eye exams.  Stay current with your vaccines (immunizations).  Do not use any tobacco products, such as cigarettes, chewing tobacco, and e-cigarettes. If you need help quitting, ask your health care provider.  Limit alcohol intake to no more than 2 drinks per day. One drink equals 12 ounces of beer, 5 ounces of wine, or 1 ounces of hard liquor.  Do not use street drugs.  Do not share needles.  Ask your health  care provider for help if you need support or information about quitting drugs.  Tell your health care provider if you often feel depressed.  Tell your health care provider if you have ever been abused or do not feel safe at home. This information is not intended to replace advice given to you by your health care provider. Make sure you discuss any questions you have with your health care provider. Document Released: 08/20/2007 Document Revised: 10/21/2015 Document Reviewed: 11/25/2014 Elsevier Interactive Patient Education  2017 [employer]vier Inc.  

## 2016-05-20 ENCOUNTER — Encounter: Payer: Self-pay | Admitting: Internal Medicine

## 2016-05-20 ENCOUNTER — Telehealth: Payer: Self-pay

## 2016-05-20 NOTE — Telephone Encounter (Signed)
Fax received from [facility] regarding rx sent for metaxalone 800mg  and Anusol Suppository.   Insurance prefers meloxicam or tizanidine instead of meloxicam Insurance prefers the cream instead of suppository.  PA can be done on both if needed. Please advise

## 2016-05-22 NOTE — Assessment & Plan Note (Signed)

## 2016-05-25 NOTE — Telephone Encounter (Signed)
Please advise in PCP absence.  

## 2016-05-26 MED ORDER — TIZANIDINE HCL 4 MG PO TABS: 2.0000 mg | ORAL_TABLET | Freq: Four times a day (QID) | ORAL | 0 refills | Status: DC | PRN

## 2016-05-26 MED ORDER — HYDROCORTISONE 2.5 % RE CREA: 1.0000 "application " | TOPICAL_CREAM | Freq: Two times a day (BID) | RECTAL | 0 refills | Status: DC

## 2016-05-26 NOTE — Telephone Encounter (Signed)
Medications sent to pharmacy

## 2016-06-16 ENCOUNTER — Ambulatory Visit (INDEPENDENT_AMBULATORY_CARE_PROVIDER_SITE_OTHER): Payer: Medicare Other | Admitting: Pharmacist

## 2016-06-16 ENCOUNTER — Other Ambulatory Visit: Payer: Self-pay | Admitting: Cardiology

## 2016-06-16 DIAGNOSIS — I4892 Unspecified atrial flutter: Secondary | ICD-10-CM | POA: Diagnosis not present

## 2016-06-16 LAB — POCT INR: INR: 3.5

## 2016-07-06 ENCOUNTER — Ambulatory Visit (INDEPENDENT_AMBULATORY_CARE_PROVIDER_SITE_OTHER): Payer: Medicare Other | Admitting: Pharmacist

## 2016-07-06 DIAGNOSIS — I4892 Unspecified atrial flutter: Secondary | ICD-10-CM | POA: Diagnosis not present

## 2016-07-06 LAB — POCT INR: INR: 2.6

## 2016-07-07 ENCOUNTER — Ambulatory Visit (INDEPENDENT_AMBULATORY_CARE_PROVIDER_SITE_OTHER): Payer: Medicare Other | Admitting: Orthopaedic Surgery

## 2016-07-07 ENCOUNTER — Ambulatory Visit (INDEPENDENT_AMBULATORY_CARE_PROVIDER_SITE_OTHER): Payer: Medicare Other

## 2016-07-07 ENCOUNTER — Encounter (INDEPENDENT_AMBULATORY_CARE_PROVIDER_SITE_OTHER): Payer: Self-pay | Admitting: Orthopaedic Surgery

## 2016-07-07 VITALS — BP 136/65 | HR 65 | Ht 67.0 in | Wt 200.0 lb

## 2016-07-07 DIAGNOSIS — M1611 Unilateral primary osteoarthritis, right hip: Secondary | ICD-10-CM

## 2016-07-07 DIAGNOSIS — M25551 Pain in right hip: Secondary | ICD-10-CM

## 2016-07-07 NOTE — Progress Notes (Signed)
Office Visit Note   Patient: [name] [name]           Date of Birth: [birth year]           MRN: [id number] Visit Date: 07/07/2016              Requested by: [name] Lima, MD [address] [place], [place] [address] PCP: [name] Calico, MD   Assessment & Plan: Visit Diagnoses:  1. Unilateral primary osteoarthritis, right hip   2. Pain in right hip     Plan:  #1:at this time we'll remaining conservative in our treatment plan. He has noted loss of internal and external rotation has difficulty getting his shoes on. But is not interested in any procedures at this time. #2: We have discussed cortisone steroid injections and his understands possibility. He is on Coumadin for 2 prosthetic heart valves.he can give [place] a call if he desires and we can plan on having seen by Dr. Ernestina [name].  Follow-Up Instructions: No Follow-up on file.   Orders:  Orders Placed This Encounter  Procedures  . XR HIP UNILAT W OR W/O PELVIS 2-3 VIEWS RIGHT  . Ambulatory referral to Physical Medicine Rehab   No orders of the defined types were placed in this encounter.     Procedures: No procedures performed   Clinical Data: No additional findings.   Subjective: Chief Complaint  Patient presents with  . Right Hip - Pain    [name] [name] is a 76 y o that presents with R hip pain and R knee pain x 5 weeks. He relates he was working in the yard using both hip and knee more than usual. He also has right groin pain and limited ROM when lifting leg. Denies injury, no fever, chills.  . Right Knee - Pain     [name] [name] is a 76 year-old white male that presents with right hip and groin pain with some pain in the lateral aspect of the knee.pain which started 5 weeks ago.[name] He relates he was working in the yard using both hips and knees more than usual. He also has right groin pain and limited ROM f the right hip. He finds it difficult to tie his shoes now. He does not have any problems with the left hip at  this time. Denies injury,  fever, or chills.     Review of Systems  Constitutional: Negative.  Negative for appetite change, diaphoresis, fatigue and unexpected weight change.  HENT: Negative.   Eyes: Negative for visual disturbance.  Respiratory: Negative for cough, chest tightness, shortness of breath and wheezing.   Cardiovascular: Negative for chest pain, palpitations and leg swelling.  Gastrointestinal: Positive for anal bleeding. Negative for abdominal pain, blood in stool, constipation, diarrhea, nausea and vomiting.  Endocrine: Negative.  Negative for cold intolerance and heat intolerance.  Genitourinary: Negative.  Negative for difficulty urinating, frequency, genital sores, scrotal swelling, testicular pain and urgency.  Musculoskeletal: Negative.  Negative for back pain, joint swelling, myalgias and neck pain.  Skin: Negative.  Negative for color change and rash.  Allergic/Immunologic: Negative.   Neurological: Negative.  Negative for dizziness, syncope, weakness, light-headedness and headaches.  Hematological: Negative.  Negative for adenopathy. Does not bruise/bleed easily.    Objective: Vital Signs: BP 136/65   Pulse 65   Ht 5\' 7"  (1.702 m)   Wt 200 lb (90.7 kg)   BMI 31.32 kg/m   Physical Exam  Constitutional: He is oriented to person, place,  and time. He appears well-developed and well-nourished.  HENT:  Head: Normocephalic and atraumatic.  Eyes: EOM are normal. Pupils are equal, round, and reactive to light.  Pulmonary/Chest: Effort normal.  Neurological: He is alert and oriented to person, place, and time.  Skin: Skin is warm and dry.  Psychiatric: He has a normal mood and affect. His behavior is normal. Judgment and thought content normal.    Ortho Exam  He has negative straight leg raising at this time. He does have marked decreased range of motion with internal and external rotation with the hip at 90 of forward flexion. He does have pain at maximum of  external rotation which does have a little better referred pain to the lateral knee. The  Knee exam is quite benign.no effusion. No ligamentous instability. Specialty Comments:  No specialty comments available.  Imaging: Xr Hip Unilat W Or W/o Pelvis 2-3 Views Right  Result Date: 07/07/2016 Two-view x-ray of the pelvis and rip reveals joint space narrowing in the medial and superior aspect of the femoral acetabular joint. Does have some spurring noted at the superior margin of the acetabulum was some cystic changes there also. Sclerotic border and on the acetabulum superiorly. There appears to be some cystic changes in the femoral head. On the lateral the right hip but does have some spurs and subcapital area of the femoral head. Does have some calcification at the superior neck on the AP pelvis.  Current Outpatient Prescriptions  Medication Sig Dispense Refill  . atorvastatin (LIPITOR) 80 MG tablet Take 1 tablet by mouth  daily 90 tablet 3  . clobetasol cream (TEMOVATE) 0.05 % as needed (skin irritation).     . Cyanocobalamin (VITAMIN B 12 PO) Take 1 tablet by mouth daily.    [name] dronedarone (MULTAQ) 400 MG tablet Take 1 tablet  by mouth 2  times daily with a meal. 180 tablet 3  . fish oil-omega-3 fatty acids 1000 MG capsule Take 3 g by mouth daily.    . hydrocortisone (ANUSOL-HC) 2.5 % rectal cream Place 1 application rectally 2 (two) times daily. 30 g 0  . hydrocortisone cream (RA ANTI-ITCH MAXIMUM STRENGTH) 1 % Apply 1 application topically as needed for itching.     . LevOCARNitine (CARNITINE PO) Take 750 mg by mouth daily.    [name] loratadine (CLARITIN) 10 MG tablet Take 10 mg by mouth as needed for allergies.    [name] losartan (COZAAR) 25 MG tablet TAKE (1/2) TABLET DAILY. 15 tablet 6  . Magnesium 400 MG TABS Take 400 mg by mouth daily.    . metoprolol succinate (TOPROL-XL) 50 MG 24 hr tablet TAKE 1 TABLET BY MOUTH 2  TIMES DAILY AT 8 AM AND 10  PM. 180 tablet 3  . pregabalin (LYRICA) 50 MG  capsule Take 1 capsule (50 mg total) by mouth 3 (three) times daily. 63 capsule 0  . Probiotic Product (PROBIOTIC FORMULA PO) Take 2 capsules by mouth daily.     . ranolazine (RANEXA) 500 MG 12 hr tablet Take 1 tablet (500 mg total) by mouth 2 (two) times daily. 180 tablet 3  . sildenafil (VIAGRA) 100 MG tablet Take 1 tablet (100 mg total) by mouth as needed for erectile dysfunction. 10 tablet 6  . tiZANidine (ZANAFLEX) 4 MG tablet Take 0.5-1 tablets (2-4 mg total) by mouth every 6 (six) hours as needed for muscle spasms. 30 tablet 0  . warfarin (COUMADIN) 5 MG tablet TAKE AS DIRECTED BY  COUMADIN CLINIC 120 tablet  1   No current facility-administered medications for this visit.      PMFS History: Patient Active Problem List   Diagnosis Date Noted  . Deficiency anemia 05/19/2016  . Internal hemorrhoid, bleeding 05/19/2016  . 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  . 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  . Aortic valve disorder 07/09/2015  . Long term current use of anticoagulant therapy 01/19/2015  . Chronic a-fib ([place]) 01/20/2014  . Elevated PSA 12/23/2013  . Hyperglycemia 12/19/2013  . BPH (benign prostatic hypertrophy) with urinary obstruction 10/29/2013  . 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 Atrial flutter ([place]) 05/17/2013  . Essential hypertension 06/19/2012  . Routine health maintenance 04/26/2011  . Hyperlipidemia with target LDL less than 130 07/07/2008  . History of colonic polyps 06/16/2008  . ERECTILE DYSFUNCTION 09/25/2007   Past Medical History:  Diagnosis Date  . Acute rheumatic heart disease, unspecified    childhood, age  65 & 13  . Acute rheumatic pericarditis   . Atrial fibrillation ([place])    history  . Diverticulosis   . External hemorrhoids without mention of complication   . 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 Lesion of ulnar nerve   . 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 Other and  unspecified hyperlipidemia   . Previous back surgery 1978, jan 2007  . Psychosexual dysfunction with inhibited sexual excitement   . SOB (shortness of breath)    "with heavy exercise"  . Stroke [facility])     Family History  Problem Relation Age of Onset  . Macular degeneration Mother     macular degeneration  . Cancer Father     intestinal/GI  . Diabetes Paternal Aunt   . Heart attack Maternal Grandfather   . Diabetes Brother     Past Surgical History:  Procedure Laterality Date  . AORTIC AND MITRAL VALVE REPLACEMENT     09/2004  . CARDIOVERSION     3 times from 2004-2006  . CARDIOVERSION N/A 09/26/2013   Procedure: CARDIOVERSION;  Surgeon: Larey Dresser, MD;  Location: [place];  Service: Cardiovascular;  Laterality: N/A;  . CARDIOVERSION N/A 06/19/2014   Procedure: CARDIOVERSION;  Surgeon: Jerline Pain, MD;  Location: [facility] ENDOSCOPY;  Service: Cardiovascular;  Laterality: N/A;  . COLONOSCOPY    . laminectomies     10/1975 and in 03/2005  . TONSILLECTOMY     1950   Social History   Occupational History  . retired    Social History Main Topics  . Smoking status: Never Smoker  . Smokeless tobacco: Never Used  . Alcohol use 1.8 oz/week    1 Glasses of wine, 1 Cans of beer, 1 Shots of liquor per week     Comment: wine occasionally  . Drug use: No  . Sexual activity: Yes    Partners: Female

## 2016-07-19 DIAGNOSIS — H2513 Age-related nuclear cataract, bilateral: Secondary | ICD-10-CM | POA: Diagnosis not present

## 2016-07-19 DIAGNOSIS — H5213 Myopia, bilateral: Secondary | ICD-10-CM | POA: Diagnosis not present

## 2016-07-19 DIAGNOSIS — H524 Presbyopia: Secondary | ICD-10-CM | POA: Diagnosis not present

## 2016-07-19 DIAGNOSIS — H52223 Regular astigmatism, bilateral: Secondary | ICD-10-CM | POA: Diagnosis not present

## 2016-07-19 DIAGNOSIS — Z83511 Family history of glaucoma: Secondary | ICD-10-CM | POA: Diagnosis not present

## 2016-07-19 DIAGNOSIS — H40013 Open angle with borderline findings, low risk, bilateral: Secondary | ICD-10-CM | POA: Diagnosis not present

## 2016-07-22 ENCOUNTER — Ambulatory Visit (INDEPENDENT_AMBULATORY_CARE_PROVIDER_SITE_OTHER): Payer: Medicare Other | Admitting: Orthopaedic Surgery

## 2016-08-02 ENCOUNTER — Ambulatory Visit (HOSPITAL_COMMUNITY)
Admission: RE | Admit: 2016-08-02 | Discharge: 2016-08-02 | Disposition: A | Discharge status: Home / Self Care | Payer: Medicare Other | Source: Ambulatory Visit | Attending: Cardiology | Admitting: Cardiology

## 2016-08-02 DIAGNOSIS — I4891 Unspecified atrial fibrillation: Secondary | ICD-10-CM | POA: Diagnosis present

## 2016-08-02 DIAGNOSIS — I440 Atrioventricular block, first degree: Secondary | ICD-10-CM | POA: Diagnosis not present

## 2016-08-03 DIAGNOSIS — R972 Elevated prostate specific antigen [PSA]: Secondary | ICD-10-CM | POA: Diagnosis not present

## 2016-08-03 LAB — PSA: PSA: 4.94

## 2016-08-04 ENCOUNTER — Ambulatory Visit (HOSPITAL_COMMUNITY)
Admission: RE | Admit: 2016-08-04 | Discharge: 2016-08-04 | Disposition: A | Discharge status: Home / Self Care | Payer: Medicare Other | Source: Ambulatory Visit | Attending: Cardiology | Admitting: Cardiology

## 2016-08-04 ENCOUNTER — Ambulatory Visit (INDEPENDENT_AMBULATORY_CARE_PROVIDER_SITE_OTHER): Payer: Medicare Other | Admitting: *Deleted

## 2016-08-04 DIAGNOSIS — I4892 Unspecified atrial flutter: Secondary | ICD-10-CM | POA: Insufficient documentation

## 2016-08-04 DIAGNOSIS — R002 Palpitations: Secondary | ICD-10-CM

## 2016-08-04 LAB — POCT INR: INR: 1.8

## 2016-08-04 NOTE — Progress Notes (Signed)
EKG NSR, reviewed by Dr. [name]. Patient could tell he was "out of bad rhythm, I could feel it fluttering while working outside in the heat for 2 hrs." Patient states he is scheduled to see his rhythm spoecialist at the [facility] next week. Advised per Dr. [name] to keep that apt and call us with updates/recommendations from that provider./ Advised also to let us know if/when he would like a cardiac event monitor placed and Dr. [name] would be happy to order and set up. Patient will also call us with any further palpitations.  Renee Pain, RN

## 2016-08-11 DIAGNOSIS — I480 Paroxysmal atrial fibrillation: Secondary | ICD-10-CM | POA: Diagnosis not present

## 2016-08-11 DIAGNOSIS — I440 Atrioventricular block, first degree: Secondary | ICD-10-CM | POA: Diagnosis not present

## 2016-08-11 DIAGNOSIS — Z952 Presence of prosthetic heart valve: Secondary | ICD-10-CM | POA: Diagnosis not present

## 2016-08-11 DIAGNOSIS — I281 Aneurysm of pulmonary artery: Secondary | ICD-10-CM | POA: Diagnosis not present

## 2016-08-11 DIAGNOSIS — I484 Atypical atrial flutter: Secondary | ICD-10-CM | POA: Diagnosis not present

## 2016-08-11 DIAGNOSIS — I359 Nonrheumatic aortic valve disorder, unspecified: Secondary | ICD-10-CM | POA: Diagnosis not present

## 2016-08-11 DIAGNOSIS — I061 Rheumatic aortic insufficiency: Secondary | ICD-10-CM | POA: Diagnosis not present

## 2016-08-11 DIAGNOSIS — I712 Thoracic aortic aneurysm, without rupture: Secondary | ICD-10-CM | POA: Diagnosis not present

## 2016-08-11 DIAGNOSIS — I38 Endocarditis, valve unspecified: Secondary | ICD-10-CM | POA: Diagnosis not present

## 2016-08-11 DIAGNOSIS — I052 Rheumatic mitral stenosis with insufficiency: Secondary | ICD-10-CM | POA: Diagnosis not present

## 2016-08-11 DIAGNOSIS — Z8673 Personal history of transient ischemic attack (TIA), and cerebral infarction without residual deficits: Secondary | ICD-10-CM | POA: Diagnosis not present

## 2016-08-17 ENCOUNTER — Ambulatory Visit (INDEPENDENT_AMBULATORY_CARE_PROVIDER_SITE_OTHER): Payer: Medicare Other | Admitting: Physical Medicine and Rehabilitation

## 2016-08-23 ENCOUNTER — Ambulatory Visit (INDEPENDENT_AMBULATORY_CARE_PROVIDER_SITE_OTHER): Payer: Medicare Other | Admitting: *Deleted

## 2016-08-23 DIAGNOSIS — R972 Elevated prostate specific antigen [PSA]: Secondary | ICD-10-CM | POA: Diagnosis not present

## 2016-08-23 DIAGNOSIS — I4892 Unspecified atrial flutter: Secondary | ICD-10-CM | POA: Diagnosis not present

## 2016-08-23 DIAGNOSIS — R351 Nocturia: Secondary | ICD-10-CM | POA: Diagnosis not present

## 2016-08-23 DIAGNOSIS — N401 Enlarged prostate with lower urinary tract symptoms: Secondary | ICD-10-CM | POA: Diagnosis not present

## 2016-08-23 LAB — POCT INR: INR: 3.1

## 2016-08-26 ENCOUNTER — Other Ambulatory Visit: Payer: Self-pay | Admitting: Cardiology

## 2016-08-26 DIAGNOSIS — I4892 Unspecified atrial flutter: Secondary | ICD-10-CM

## 2016-08-26 DIAGNOSIS — E785 Hyperlipidemia, unspecified: Secondary | ICD-10-CM

## 2016-09-14 ENCOUNTER — Ambulatory Visit (INDEPENDENT_AMBULATORY_CARE_PROVIDER_SITE_OTHER): Payer: Medicare Other | Admitting: Pharmacist

## 2016-09-14 DIAGNOSIS — I4892 Unspecified atrial flutter: Secondary | ICD-10-CM | POA: Diagnosis not present

## 2016-09-14 LAB — POCT INR: INR: 2.5

## 2016-10-19 ENCOUNTER — Ambulatory Visit (INDEPENDENT_AMBULATORY_CARE_PROVIDER_SITE_OTHER): Payer: Medicare Other | Admitting: *Deleted

## 2016-10-19 DIAGNOSIS — I4892 Unspecified atrial flutter: Secondary | ICD-10-CM

## 2016-10-19 LAB — POCT INR: INR: 3.4

## 2016-10-20 ENCOUNTER — Encounter: Payer: Self-pay | Admitting: Internal Medicine

## 2016-11-16 ENCOUNTER — Ambulatory Visit (INDEPENDENT_AMBULATORY_CARE_PROVIDER_SITE_OTHER): Payer: Medicare Other | Admitting: *Deleted

## 2016-11-16 DIAGNOSIS — I4892 Unspecified atrial flutter: Secondary | ICD-10-CM | POA: Diagnosis not present

## 2016-11-16 DIAGNOSIS — Z7901 Long term (current) use of anticoagulants: Secondary | ICD-10-CM | POA: Diagnosis not present

## 2016-11-16 LAB — POCT INR: INR: 2.6

## 2016-11-28 ENCOUNTER — Other Ambulatory Visit: Payer: Self-pay | Admitting: Cardiology

## 2016-12-14 DIAGNOSIS — Z23 Encounter for immunization: Secondary | ICD-10-CM | POA: Diagnosis not present

## 2016-12-27 ENCOUNTER — Ambulatory Visit (INDEPENDENT_AMBULATORY_CARE_PROVIDER_SITE_OTHER): Payer: Medicare Other | Admitting: *Deleted

## 2016-12-27 DIAGNOSIS — I4892 Unspecified atrial flutter: Secondary | ICD-10-CM

## 2016-12-27 DIAGNOSIS — Z5181 Encounter for therapeutic drug level monitoring: Secondary | ICD-10-CM

## 2016-12-27 LAB — POCT INR: INR: 2.9

## 2017-01-18 ENCOUNTER — Other Ambulatory Visit: Payer: Self-pay | Admitting: Internal Medicine

## 2017-01-23 ENCOUNTER — Encounter (HOSPITAL_COMMUNITY): Payer: Self-pay | Admitting: Cardiology

## 2017-01-23 ENCOUNTER — Ambulatory Visit (HOSPITAL_COMMUNITY)
Admission: RE | Admit: 2017-01-23 | Discharge: 2017-01-23 | Disposition: A | Discharge status: Home / Self Care | Payer: Medicare Other | Source: Ambulatory Visit | Attending: Cardiology | Admitting: Cardiology

## 2017-01-23 VITALS — BP 144/88 | HR 73 | Wt 208.1 lb

## 2017-01-23 DIAGNOSIS — Z79899 Other long term (current) drug therapy: Secondary | ICD-10-CM | POA: Insufficient documentation

## 2017-01-23 DIAGNOSIS — I10 Essential (primary) hypertension: Secondary | ICD-10-CM | POA: Diagnosis not present

## 2017-01-23 DIAGNOSIS — I059 Rheumatic mitral valve disease, unspecified: Secondary | ICD-10-CM

## 2017-01-23 DIAGNOSIS — Z952 Presence of prosthetic heart valve: Secondary | ICD-10-CM | POA: Insufficient documentation

## 2017-01-23 DIAGNOSIS — R002 Palpitations: Secondary | ICD-10-CM | POA: Insufficient documentation

## 2017-01-23 DIAGNOSIS — R0609 Other forms of dyspnea: Secondary | ICD-10-CM | POA: Diagnosis not present

## 2017-01-23 DIAGNOSIS — I484 Atypical atrial flutter: Secondary | ICD-10-CM | POA: Diagnosis not present

## 2017-01-23 DIAGNOSIS — Z8673 Personal history of transient ischemic attack (TIA), and cerebral infarction without residual deficits: Secondary | ICD-10-CM | POA: Diagnosis not present

## 2017-01-23 DIAGNOSIS — I712 Thoracic aortic aneurysm, without rupture: Secondary | ICD-10-CM | POA: Diagnosis not present

## 2017-01-23 DIAGNOSIS — I4892 Unspecified atrial flutter: Secondary | ICD-10-CM

## 2017-01-23 DIAGNOSIS — E785 Hyperlipidemia, unspecified: Secondary | ICD-10-CM

## 2017-01-23 DIAGNOSIS — I359 Nonrheumatic aortic valve disorder, unspecified: Secondary | ICD-10-CM | POA: Diagnosis not present

## 2017-01-23 MED ORDER — LOSARTAN POTASSIUM 25 MG PO TABS: 25.0000 mg | ORAL_TABLET | Freq: Every day | ORAL | 3 refills | Status: DC

## 2017-01-23 NOTE — Patient Instructions (Signed)
Increase losartan 25 mg (1 tab) daily   Your physician has requested that you have a stress echocardiogram. For further information please visit [email or web address]. Please follow instruction sheet as given.  Your physician has recommended that you wear a holter monitor. Holter monitors are medical devices that record the heart's electrical activity. Doctors most often use these monitors to diagnose arrhythmias. Arrhythmias are problems with the speed or rhythm of the heartbeat. The monitor is a small, portable device. You can wear one while you do your normal daily activities. This is usually used to diagnose what is causing palpitations/syncope (passing out).  Your physician recommends that you schedule a follow-up appointment in: 2 months with Dr. [name]

## 2017-01-24 NOTE — Progress Notes (Signed)
Patient ID: [name] [name], male   DOB: 1940/12/03, 76 y.o.   MRN: [id number] PCP: Dr. Maudie Mercury Cardiology: Dr. [name]  76 y.o. 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  He also has an ascending aortic aneurysm.   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  I started him on dronedarone and coumadin with plan for cardioversion after 4 wks of therapeutic anticoagulation if he did not convert back to NSR on his own.  He went back into NSR spontaneously and later stopped coumadin. Atrial flutter recurred in 3/15 and again resolved spontaneously.  In 6/15, patient was admitted with transient dysarthria (resolved in about 10 minutes).  However, he was found to have evidence for CVA by MRI.  Therefore, he was started on coumadin. He was noted to be in atypical atrial flutter again.  Ranolazine was added to dronedarone.    In 8/15, Mr Kisling was seen in consultation at the [facility] clinic.  He had atrial flutter ablation at [facility] in 11/15.    He was seen in followup at [facility] in 2/16.  Ranolazine was stopped.  Echo in 2/16 showed EF 55% with stable bioprosthetic mitral and aortic valves.  CTA chest showed stable 5 cm ascending aortic aneurysm.  He went back into atypical atrial flutter in 4/16.  Ranolazine was restarted and he was cardioverted back to NSR.    Last visit to [facility] was in 5/18.  Echo at that time showed EF 60-65% with stable bioprosthetic mitral and aortic valves.  CTA showed 5.0 cm aortic root and ascending aorta.  He is in NSR today.  He has been noticing palpitations/tachycardia with heavy yardwork and heavy exercise.  No lightheadedness/syncope.  He will feel tired/fatigued when he feels the fluttering.  No dyspnea walking on flat ground, but he does  get short of breath walking up 2 flights of stairs, which is new for him.  No chest pain or tightness.  Weight is up about 4 lbs.  BP is mildly elevated.  SBP at home runs 120s-140s when he checks.   ECG (12/12/11): Atypical atrial flutter versus atrial tachycardia with rate around 100 bpm, QTc normal.  ECG (12/29/11): NSR, 1st degree AV block PR 242 msec.    ECG (12/13): NSR, 1st degree AV block PR 244 msec    ECG (10/14): NSR, 1st degree AV block ECG (3/15): Atypical atrial flutter with rate 87, inferior T wave inversions ECG (6/15): Atypical atrial flutter with rate 77 ECG (8/15): atypical atrial flutter rate 82 ECG (10/15): NSR, 1st degree AV block ECG (12/15): NSR, 1st degree AV block, QTc 410 msec ECG (5/16): NSR, 1st degree AV block, QTc 419 msec ECG (5/17): NSR, 1st degree AV block 270 msec, QTc 433 ECG (2/18): NSR, 1st degree AVB, nonspecific ST-T changes, QTc 435 msec ECG (11/18, personally reviewed): NSR, 1st degree AVB, QTc 424 msec  Labs (3/14): LDL 64, TSH normal, K 3.9, creatinine 1.0     Labs (6/15): LDL 54, HDL 48, creatinine 0.95   Labs (9/15): LDL 54, HDL 25 Labs (10/15): K 4.5, creatinine 1.3, AST 45, ALT 64 Labs (4/16): K 4.3, creatinine 1.28, HCT 40.7 Labs (5/17): K 4.1, creatinine 1.16, LDL 66, HDL 47 Labs (2/18): LDL 42, HDL 43, K 4.4, creatinine 1.48  PMH: 1. Atrial fibrillation: s/p MAZE and LAA ligation in 2006.  No documented atrial fibrillation since operation.  2. Atypical atrial flutter: 10/13, 3/15, and again in 6/15.  Recurrent 4/16 with DCCV.  3. 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   - Echo 5/11 with EF 60-65%, normal bioprosthetic aortic valve, normal bioprosthetic mitral valve, moderate LAE.   - Echo 11/12 at [facility] looked ok per report.  - Echo  (10/13) with EF 55-60%, mild LVH, normal bioprosthetic aortic and mitral valves, moderate LAE, PA systolic pressure 35 mmHg.   - Echo (6/15) with EF 60%, moderate LVH, normal-appearing bioprosthetic MV and AoV, PA systolic pressure 32 mmHg.   - Echo 9/15 [facility]) with EF 55%, mild LVH, PASP 52 mmHg, severe LAE, bioprosthetic mitral valve with no MR and mean gradient 11 mmHg, moderate TR, bioprosthetic aortic valve with no AI and mean gradient 9 mmHg, dilated ascending aorta to 5 cm.   - Echo (2/16, [facility]) with EF 55%, stable bioprosthetic aortic and mitral valves (AoV mean gradient 7 mmHg, MV mean gradient 4 mmHg), normal RV size and systolic function.  - Echo (12/16, [facility]) with EF 58%, mild RV dilation with low normal systolic function, PA systolic pressure 34 mmHg, bioprosthetic MV and AoV functioning normally.  - Echo (5/18, [facility]) with EF 60-65%, RV normal size and systolic function, moderate LAE, bioprosthetic MV mean gradient 6 with no MR, bioprosthetic aortic valve with mean gradient 11 and no AI, PASP 41 mmHg.  4. Hyperlipidemia 5. L-spine surgery 6. LHC with no significant coronary disease in 2006 pre-op.  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  CTA chest 9/15 with dilated sinuses of valsalva to 5.0 cm and ascending aorta 4.9 cm. CTA chest (2/16) with 5.0 cm ascending aorta.  CTA chest (12/16) with 4.9 cm aortic root at SOV, 4.8 cm ascending aorta.  - CT chest (5/18, [facility]): 5.0 cm aortic root, 5.0 cm ascending aorta.   SH: Married, retired Forensic psychologist [name], [place], [place]).  2 sons.  Never smoked.   FH: No premature CAD.   ROS: All systems reviewed and negative except as per HPI.  Current Outpatient Medications  Medication Sig Dispense Refill  . atorvastatin (LIPITOR) 80 MG tablet Take 1 tablet by mouth  daily 90 tablet 3  .  clobetasol cream (TEMOVATE) 0.05 % as needed (skin irritation).     . Cyanocobalamin (VITAMIN B 12 PO) Take 1 tablet by mouth daily.    [name] dronedarone (MULTAQ) 400 MG tablet Take 1 tablet  by mouth 2  times daily with a meal. 180 tablet 3  . fish oil-omega-3 fatty acids 1000 MG capsule Take 3 g by mouth daily.    . hydrocortisone (ANUSOL-HC) 2.5 % rectal cream Place 1 application rectally 2 (two) times daily. 30 g 0  . hydrocortisone cream (RA ANTI-ITCH MAXIMUM STRENGTH) 1 % Apply 1 application topically as needed for itching.     . LevOCARNitine (CARNITINE PO) Take 750 mg by mouth daily.    [name] loratadine (CLARITIN) 10 MG tablet Take 10 mg by mouth as needed for allergies.    [name] losartan (COZAAR) 25 MG tablet Take 1 tablet (25 mg total) daily by mouth. 30 tablet 3  . Magnesium 400 MG TABS Take 400 mg by mouth daily.    . metoprolol succinate (TOPROL-XL) 50  MG 24 hr tablet TAKE 1 TABLET BY MOUTH 2  TIMES DAILY AT 8 AM AND 10  PM. 180 tablet 3  . pregabalin (LYRICA) 50 MG capsule Take 1 capsule (50 mg total) by mouth 3 (three) times daily. 63 capsule 0  . Probiotic Product (PROBIOTIC FORMULA PO) Take 2 capsules by mouth daily.     [name] RANEXA 500 MG 12 hr tablet TAKE 1 TABLET BY MOUTH TWO  TIMES DAILY 180 tablet 3  . sildenafil (VIAGRA) 100 MG tablet Take 1 tablet (100 mg total) by mouth as needed for erectile dysfunction. 10 tablet 6  . tiZANidine (ZANAFLEX) 4 MG tablet Take 0.5-1 tablets (2-4 mg total) by mouth every 6 (six) hours as needed for muscle spasms. 30 tablet 0  . warfarin (COUMADIN) 5 MG tablet Take as directed by Coumadin Clinic 120 tablet 1   No current facility-administered medications for this encounter.     BP (!) 144/88   Pulse 73   Wt 208 lb 1.9 oz (94.4 kg)   SpO2 96%   BMI 32.60 kg/m  General: NAD Neck: No JVP 8 cm, no thyromegaly or thyroid nodule.  Lungs: Clear to auscultation bilaterally with normal respiratory effort. CV: Nondisplaced PMI.  Heart regular S1/S2, no  S3/S4, 1/6 SEM RUSB.  No peripheral edema.  No carotid bruit.  Normal pedal pulses.  Abdomen: Soft, nontender, no hepatosplenomegaly, no distention.  Skin: Intact without lesions or rashes.  Neurologic: Alert and oriented x 3.  Psych: Normal affect. Extremities: No clubbing or cyanosis.  HEENT: Normal.    Assessment/Plan: 1. Atypical atrial flutter: Patient had a MAZE procedure with LAA ligation at the time of his valve replacement.  Since then, he has had occasional episodes of atypical atrial flutter. He had episodes in 10/13 and 3/15 that converted back to NSR on dronedarone. He was noted to be back in atrial flutter in 6/15 in the setting of a CVA.  He had ablation at [facility] in 11/15.  Atrial flutter recurred after ranolazine was stopped and he had DCCV again in 4/16.  He remains in NSR today but has felt palpitations more frequently recently. - Continue coumadin. This will need to be continued long term even with ablation given CVA history. Check CBC.  - Continue current Toprol XL. - He will continue dronedarone and ranolazine for now to maintain NSR.  QTc interval ok on ECG today.  He had recurrent atrial flutter when he tried stopping ranolazine.   - I will get a 30 day monitor to assess atrial flutter borden.  If there is a significant burden, could consider stopping ranolazine/dronedarone and starting dofetilide.  2. 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  Valve looked stable on 5/18 echo at [facility].   3. HTN: BP is high at times.  Given ascending aortic aneurysm, need better control.  - Increase losartan to 25 mg daily (some data for ARBs in slowing growth of ascending aortic aneurysms).  BMET in 10 days.   4. Hyperlipidemia: Good lipids in 2/18.   5. CVA: Suspect this was related to atrial flutter (despite LA appendage ligation).  He is now on coumadin. No residual neurologic deficits.  Continue long-term. 6. Thoracic aortic aneurysm:  Dilated aortic root at sinuses of valsalva and ascending aorta to 5 cm, last CTA in 5/18 (stable).   - I will arrange for CTA chest again in 5/19 to follow aneurysm.    - Continue Toprol XL given some  evidence that beta blockers may attenuate thoracic aortic aneurysm growth.  - Increase losartan to 25 mg dailiy.  7. Exertional palpitations and dyspnea: This is new over the last few months.  May be arrhythmia-related, but given exertional component, I will arrange for ETT-Cardiolite.   Loralie Champagne 01/24/2017

## 2017-01-31 ENCOUNTER — Other Ambulatory Visit (HOSPITAL_COMMUNITY): Payer: Self-pay

## 2017-02-01 ENCOUNTER — Other Ambulatory Visit (HOSPITAL_COMMUNITY): Payer: Self-pay

## 2017-02-01 DIAGNOSIS — R0600 Dyspnea, unspecified: Secondary | ICD-10-CM

## 2017-02-07 NOTE — Addendum Note (Signed)
Encounter addended by: Scarlette Calico, RN on: 02/07/2017 5:00 PM  Actions taken: Order list changed, Diagnosis association updated

## 2017-02-08 ENCOUNTER — Ambulatory Visit (INDEPENDENT_AMBULATORY_CARE_PROVIDER_SITE_OTHER): Payer: Medicare Other | Admitting: *Deleted

## 2017-02-08 ENCOUNTER — Ambulatory Visit: Payer: Medicare Other

## 2017-02-08 ENCOUNTER — Ambulatory Visit (INDEPENDENT_AMBULATORY_CARE_PROVIDER_SITE_OTHER): Payer: Medicare Other

## 2017-02-08 DIAGNOSIS — I059 Rheumatic mitral valve disease, unspecified: Secondary | ICD-10-CM

## 2017-02-08 DIAGNOSIS — I4892 Unspecified atrial flutter: Secondary | ICD-10-CM

## 2017-02-08 DIAGNOSIS — Z5181 Encounter for therapeutic drug level monitoring: Secondary | ICD-10-CM

## 2017-02-08 DIAGNOSIS — I482 Chronic atrial fibrillation, unspecified: Secondary | ICD-10-CM

## 2017-02-08 LAB — POCT INR: INR: 2.6

## 2017-02-15 ENCOUNTER — Telehealth (HOSPITAL_COMMUNITY): Payer: Self-pay | Admitting: *Deleted

## 2017-02-15 NOTE — Telephone Encounter (Signed)
Left message on voicemail in reference to upcoming appointment scheduled for 02/20/17 Phone number given for a call back so details instructions can be given. Kirstie Peri

## 2017-02-16 ENCOUNTER — Telehealth (HOSPITAL_COMMUNITY): Payer: Self-pay

## 2017-02-16 NOTE — Telephone Encounter (Signed)
Patient given detailed instructions per Myocardial Perfusion Study Information Sheet for the test on 02/20/2017 at 9:45. Patient notified to arrive 15 minutes early and that it is imperative to arrive on time for appointment to keep from having the test rescheduled.  If you need to cancel or reschedule your appointment, please call the office within 24 hours of your appointment. . Patient verbalized understanding.EHK

## 2017-02-20 ENCOUNTER — Encounter (HOSPITAL_COMMUNITY): Payer: Self-pay | Admitting: Cardiology

## 2017-02-20 ENCOUNTER — Ambulatory Visit (HOSPITAL_COMMUNITY): Payer: Medicare Other | Attending: Cardiovascular Disease

## 2017-02-20 DIAGNOSIS — R0609 Other forms of dyspnea: Secondary | ICD-10-CM | POA: Insufficient documentation

## 2017-02-20 LAB — MYOCARDIAL PERFUSION IMAGING
CHL CUP NUCLEAR SRS: 2
CHL CUP RESTING HR STRESS: 63 {beats}/min
CSEPPHR: 77 {beats}/min
LVDIAVOL: 132 mL (ref 62–150)
LVSYSVOL: 70 mL
NUC STRESS TID: 1.09
RATE: 0.32
SDS: 0
SSS: 2

## 2017-02-20 MED ORDER — TECHNETIUM TC 99M TETROFOSMIN IV KIT
32.0000 | PACK | Freq: Once | INTRAVENOUS | Status: AC | PRN
  Administered 2017-02-20: 32 via INTRAVENOUS
  Filled 2017-02-20: qty 32

## 2017-02-20 MED ORDER — REGADENOSON 0.4 MG/5ML IV SOLN
0.4000 mg | Freq: Once | INTRAVENOUS | Status: AC
  Administered 2017-02-20: 0.4 mg via INTRAVENOUS

## 2017-02-20 MED ORDER — TECHNETIUM TC 99M TETROFOSMIN IV KIT
10.4000 | PACK | Freq: Once | INTRAVENOUS | Status: AC | PRN
  Administered 2017-02-20: 10.4 via INTRAVENOUS
  Filled 2017-02-20: qty 11

## 2017-02-21 ENCOUNTER — Telehealth (HOSPITAL_COMMUNITY): Payer: Self-pay

## 2017-02-21 DIAGNOSIS — R972 Elevated prostate specific antigen [PSA]: Secondary | ICD-10-CM | POA: Diagnosis not present

## 2017-02-21 DIAGNOSIS — Z952 Presence of prosthetic heart valve: Secondary | ICD-10-CM

## 2017-02-21 LAB — PSA: PSA: 6.06

## 2017-02-21 NOTE — Telephone Encounter (Signed)
Notes recorded by Shirley Muscat, RN on 02/21/2017 at 2:02 PM EST Pt aware of results and echo scheduled with f/u appt ------  Notes recorded by Shirley Muscat, RN on 02/21/2017 at 1:15 PM EST Left VM  ------  Notes recorded by Larey Dresser, MD on 02/20/2017 at 2:48 PM EST No ischemia so suspect no significant new blockage in his coronaries. EF read as 47%, which is mildly decreased. Would recommend we get an echocardiogram to re-check his EF (has been normal in the past). I do not think that he needs a cath at this point.

## 2017-02-21 NOTE — Telephone Encounter (Signed)
Message routed as an Pharmacist, hospital to [employer]. Patient would like a call about MYOCARDIAL PERFUSION IMAGING resulted on 02/20/17, 1:41 PM.

## 2017-03-07 DIAGNOSIS — I7789 Other specified disorders of arteries and arterioles: Secondary | ICD-10-CM

## 2017-03-07 HISTORY — DX: Other specified disorders of arteries and arterioles: I77.89

## 2017-03-09 ENCOUNTER — Other Ambulatory Visit (HOSPITAL_COMMUNITY): Payer: Self-pay | Admitting: *Deleted

## 2017-03-09 DIAGNOSIS — I4892 Unspecified atrial flutter: Secondary | ICD-10-CM

## 2017-03-09 DIAGNOSIS — E785 Hyperlipidemia, unspecified: Secondary | ICD-10-CM

## 2017-03-09 MED ORDER — DRONEDARONE HCL 400 MG PO TABS: ORAL_TABLET | ORAL | 3 refills | Status: DC

## 2017-03-09 MED ORDER — RANOLAZINE ER 500 MG PO TB12: 500.0000 mg | ORAL_TABLET | Freq: Two times a day (BID) | ORAL | 3 refills | Status: DC

## 2017-03-09 MED ORDER — LOSARTAN POTASSIUM 25 MG PO TABS: 25.0000 mg | ORAL_TABLET | Freq: Every day | ORAL | 3 refills | Status: DC

## 2017-03-09 MED ORDER — METOPROLOL SUCCINATE ER 50 MG PO TB24: ORAL_TABLET | ORAL | 3 refills | Status: DC

## 2017-03-09 MED ORDER — WARFARIN SODIUM 5 MG PO TABS: ORAL_TABLET | ORAL | 1 refills | Status: DC

## 2017-03-09 MED ORDER — ATORVASTATIN CALCIUM 80 MG PO TABS: ORAL_TABLET | ORAL | 3 refills | Status: DC

## 2017-03-22 ENCOUNTER — Ambulatory Visit (INDEPENDENT_AMBULATORY_CARE_PROVIDER_SITE_OTHER): Payer: Medicare Other | Admitting: *Deleted

## 2017-03-22 DIAGNOSIS — I4892 Unspecified atrial flutter: Secondary | ICD-10-CM

## 2017-03-22 DIAGNOSIS — Z5181 Encounter for therapeutic drug level monitoring: Secondary | ICD-10-CM

## 2017-03-22 LAB — POCT INR: INR: 2.2

## 2017-03-22 NOTE — Patient Instructions (Addendum)
Description   Continue taking 1 tablet every day except 1 and 1/2 tablets on Fridays. Recheck INR in 5-6 weeks.

## 2017-03-31 ENCOUNTER — Encounter (HOSPITAL_COMMUNITY): Payer: Medicare Other | Admitting: Cardiology

## 2017-03-31 ENCOUNTER — Other Ambulatory Visit (HOSPITAL_COMMUNITY): Payer: Medicare Other

## 2017-04-18 ENCOUNTER — Telehealth (HOSPITAL_COMMUNITY): Payer: Self-pay | Admitting: *Deleted

## 2017-04-18 NOTE — Telephone Encounter (Signed)
Pt called to report he had a stomach virus over the weekend but is starting to feel better. Patient feels like his heart is out of rhythm and is just tired. He doesn't feel like the fatigue is from the stomach virus. Per Dr.McLean have patient come in Thursday for EKG. If patient is in rhythm no change. If Afib/Aflutter will need DCCV.

## 2017-04-20 ENCOUNTER — Other Ambulatory Visit (HOSPITAL_COMMUNITY): Payer: Self-pay | Admitting: *Deleted

## 2017-04-20 ENCOUNTER — Other Ambulatory Visit: Payer: Self-pay

## 2017-04-20 ENCOUNTER — Telehealth (HOSPITAL_COMMUNITY): Payer: Self-pay | Admitting: *Deleted

## 2017-04-20 ENCOUNTER — Encounter (HOSPITAL_COMMUNITY): Payer: Self-pay

## 2017-04-20 ENCOUNTER — Encounter (HOSPITAL_COMMUNITY): Payer: Self-pay | Admitting: *Deleted

## 2017-04-20 ENCOUNTER — Ambulatory Visit (HOSPITAL_COMMUNITY)
Admission: RE | Admit: 2017-04-20 | Discharge: 2017-04-20 | Disposition: A | Discharge status: Home / Self Care | Payer: Medicare Other | Source: Ambulatory Visit | Attending: Internal Medicine | Admitting: Internal Medicine

## 2017-04-20 VITALS — BP 132/88 | HR 102 | Wt 192.0 lb

## 2017-04-20 DIAGNOSIS — I482 Chronic atrial fibrillation, unspecified: Secondary | ICD-10-CM

## 2017-04-20 LAB — PROTIME-INR
INR: 3.36
Prothrombin Time: 33.8 seconds — ABNORMAL HIGH (ref 11.4–15.2)

## 2017-04-20 NOTE — Patient Instructions (Signed)
You have been scheduled for a Cardioversion with Dr. [name], please see attached instruction sheet.

## 2017-04-20 NOTE — Telephone Encounter (Signed)
Patient's Cardioversion rescheduled for Monday February 18th @ 1:00pm.  Per Dr. [name] patient has been instructed to go to the coumadin clinic for INR check before procedure.  Patient understands.

## 2017-04-21 ENCOUNTER — Telehealth: Payer: Self-pay | Admitting: *Deleted

## 2017-04-21 NOTE — Telephone Encounter (Signed)
Pt called wanting to know what he needed to do as his INR was checked  by Dr [name] yesterday and was 3.36 Pt instructed that he needs to continue coumadin dose as usual and to eat good serving of greens this weekend and to keep scheduled appt for Monday to have INR rechecked and he states understanding

## 2017-04-24 ENCOUNTER — Ambulatory Visit (HOSPITAL_COMMUNITY): Payer: Medicare Other | Admitting: Anesthesiology

## 2017-04-24 ENCOUNTER — Other Ambulatory Visit: Payer: Self-pay

## 2017-04-24 ENCOUNTER — Encounter (HOSPITAL_COMMUNITY)
Admission: RE | Disposition: A | Discharge status: Home / Self Care | Payer: Self-pay | Source: Ambulatory Visit | Attending: Cardiology

## 2017-04-24 ENCOUNTER — Ambulatory Visit (INDEPENDENT_AMBULATORY_CARE_PROVIDER_SITE_OTHER): Payer: Medicare Other

## 2017-04-24 ENCOUNTER — Encounter (HOSPITAL_COMMUNITY): Payer: Self-pay | Admitting: *Deleted

## 2017-04-24 ENCOUNTER — Ambulatory Visit (HOSPITAL_COMMUNITY)
Admission: RE | Admit: 2017-04-24 | Discharge: 2017-04-24 | Disposition: A | Discharge status: Home / Self Care | Payer: Medicare Other | Source: Ambulatory Visit | Attending: Cardiology | Admitting: Cardiology

## 2017-04-24 DIAGNOSIS — I4892 Unspecified atrial flutter: Secondary | ICD-10-CM

## 2017-04-24 DIAGNOSIS — Z8673 Personal history of transient ischemic attack (TIA), and cerebral infarction without residual deficits: Secondary | ICD-10-CM | POA: Insufficient documentation

## 2017-04-24 DIAGNOSIS — I4891 Unspecified atrial fibrillation: Secondary | ICD-10-CM | POA: Diagnosis not present

## 2017-04-24 DIAGNOSIS — I484 Atypical atrial flutter: Secondary | ICD-10-CM | POA: Diagnosis not present

## 2017-04-24 DIAGNOSIS — I482 Chronic atrial fibrillation: Secondary | ICD-10-CM | POA: Diagnosis not present

## 2017-04-24 DIAGNOSIS — Z7901 Long term (current) use of anticoagulants: Secondary | ICD-10-CM | POA: Insufficient documentation

## 2017-04-24 DIAGNOSIS — Z5181 Encounter for therapeutic drug level monitoring: Secondary | ICD-10-CM | POA: Diagnosis not present

## 2017-04-24 DIAGNOSIS — Z79899 Other long term (current) drug therapy: Secondary | ICD-10-CM | POA: Diagnosis not present

## 2017-04-24 DIAGNOSIS — I10 Essential (primary) hypertension: Secondary | ICD-10-CM | POA: Diagnosis not present

## 2017-04-24 DIAGNOSIS — I349 Nonrheumatic mitral valve disorder, unspecified: Secondary | ICD-10-CM | POA: Diagnosis not present

## 2017-04-24 HISTORY — PX: CARDIOVERSION: SHX1299

## 2017-04-24 LAB — POCT I-STAT 4, (NA,K, GLUC, HGB,HCT)
GLUCOSE: 103 mg/dL — AB (ref 65–99)
HEMATOCRIT: 35 % — AB (ref 39.0–52.0)
Hemoglobin: 11.9 g/dL — ABNORMAL LOW (ref 13.0–17.0)
POTASSIUM: 4.3 mmol/L (ref 3.5–5.1)
SODIUM: 142 mmol/L (ref 135–145)

## 2017-04-24 LAB — POCT INR: INR: 4.7

## 2017-04-24 SURGERY — CARDIOVERSION
Anesthesia: General

## 2017-04-24 MED ORDER — PROPOFOL 10 MG/ML IV BOLUS
INTRAVENOUS | Status: DC | PRN
  Administered 2017-04-24: 90 mg via INTRAVENOUS

## 2017-04-24 MED ORDER — SODIUM CHLORIDE 0.9 % IV SOLN
INTRAVENOUS | Status: AC | PRN
  Administered 2017-04-24: 12:00:00 via INTRAVENOUS
  Administered 2017-04-24: 500 mL via INTRAVENOUS

## 2017-04-24 MED ORDER — LIDOCAINE HCL (CARDIAC) 20 MG/ML IV SOLN
INTRAVENOUS | Status: DC | PRN
  Administered 2017-04-24: 60 mg via INTRAVENOUS

## 2017-04-24 NOTE — Anesthesia Postprocedure Evaluation (Signed)
Anesthesia Post Note  Patient: [name] [name]  Procedure(s) Performed: CARDIOVERSION (N/A )     Patient location during evaluation: PACU Anesthesia Type: General Level of consciousness: awake and alert Pain management: pain level controlled Vital Signs Assessment: post-procedure vital signs reviewed and stable Respiratory status: spontaneous breathing, nonlabored ventilation, respiratory function stable and patient connected to nasal cannula oxygen Cardiovascular status: blood pressure returned to baseline and stable Postop Assessment: no apparent nausea or vomiting Anesthetic complications: no    Last Vitals:  Vitals:   04/24/17 1239 04/24/17 1249  BP: 101/65 109/69  Pulse: (!) 58 (!) 58  Resp: 20 13  Temp: 36.6 C   SpO2: 100% 100%    Last Pain:  Vitals:   04/24/17 1239  TempSrc: Oral                 Ryan P Ellender

## 2017-04-24 NOTE — Procedures (Signed)
Electrical Cardioversion Procedure Note YEHONATAN GRANDISON [id number] Mar 29, 1940  Procedure: Electrical Cardioversion Indications:  Atrial flutter  Procedure Details Consent: Risks of procedure as well as the alternatives and risks of each were explained to the (patient/caregiver).  Consent for procedure obtained. Time Out: Verified patient identification, verified procedure, site/side was marked, verified correct patient position, special equipment/implants available, medications/allergies/relevent history reviewed, required imaging and test results available.  Performed  Patient placed on cardiac monitor, pulse oximetry, supplemental oxygen as necessary.  Sedation given: Propofol Pacer pads placed anterior and posterior chest.  Cardioverted 2 time(s).  Cardioverted at 200J with sternal pressure.   Evaluation Findings: Post procedure EKG shows: NSR Complications: None Patient did tolerate procedure well.   Loralie Champagne 04/24/2017, 12:28 PM

## 2017-04-24 NOTE — Anesthesia Preprocedure Evaluation (Addendum)
Anesthesia Evaluation  Patient identified by MRN, date of birth, ID band Patient awake    Reviewed: Allergy & Precautions, NPO status , Patient's Chart, lab work & pertinent test results, reviewed documented beta blocker date and time   Airway Mallampati: III  TM Distance: >3 FB Neck ROM: Full    Dental no notable dental hx.    Pulmonary neg pulmonary ROS,    Pulmonary exam normal breath sounds clear to auscultation       Cardiovascular hypertension, Pt. on medications and Pt. on home beta blockers (-) CABG Normal cardiovascular exam+ dysrhythmias Atrial Fibrillation + Valvular Problems/Murmurs (s/p AVR, MVR)  Rhythm:Regular Rate:Normal  ECG: SR, 1st degree AV block  Nuclear stress EF: 47%. No wall motion abnormalities There was no ST segment deviation noted during stress. Defect 1: There is a small defect of mild severity present in the apex location. No ischemia identified The study is normal. This is a low risk study.   Neuro/Psych CVA, No Residual Symptoms negative psych ROS   GI/Hepatic negative GI ROS, Neg liver ROS,   Endo/Other  negative endocrine ROS  Renal/GU negative Renal ROS     Musculoskeletal negative musculoskeletal ROS (+)   Abdominal   Peds  Hematology HLD   Anesthesia Other Findings A-FLUTTER  Reproductive/Obstetrics                            Anesthesia Physical Anesthesia Plan  ASA: III  Anesthesia Plan: General   Post-op Pain Management:    Induction: Intravenous  PONV Risk Score and Plan: 2 and Propofol infusion and Treatment may vary due to age or medical condition  Airway Management Planned: Natural Airway and Mask  Additional Equipment:   Intra-op Plan:   Post-operative Plan:   Informed Consent: I have reviewed the patients History and Physical, chart, labs and discussed the procedure including the risks, benefits and alternatives for the proposed  anesthesia with the patient or authorized representative who has indicated his/her understanding and acceptance.   Dental advisory given  Plan Discussed with: CRNA  Anesthesia Plan Comments:         Anesthesia Quick Evaluation

## 2017-04-24 NOTE — Transfer of Care (Signed)
Immediate Anesthesia Transfer of Care Note  Patient: [name] [name]  Procedure(s) Performed: CARDIOVERSION (N/A )  Patient Location: Endoscopy Unit  Anesthesia Type:General  Level of Consciousness: awake, alert  and sedated  Airway & Oxygen Therapy: Patient connected to nasal cannula oxygen  Post-op Assessment: Post -op Vital signs reviewed and stable  Post vital signs: stable  Last Vitals:  Vitals:   04/24/17 1133  BP: 134/90  Resp: 16  Temp: 36.6 C  SpO2: 98%    Last Pain:  Vitals:   04/24/17 1133  TempSrc: Oral         Complications: No apparent anesthesia complications

## 2017-04-24 NOTE — H&P (Signed)
PCP:  [name], MD  PCP-Cardiology: [name] Reason for Admission:  HPI:    Patient with history of atypical atrial flutter s/p ablation has had recurrence of atrial flutter.  Here for DCCV.     Review of Systems: All systems reviewed and negative except as per HPI.   Home Medications Prior to Admission medications   Medication Sig Start Date End Date Taking? Authorizing Provider  atorvastatin (LIPITOR) 80 MG tablet Take 1 tablet by mouth  daily Patient taking differently: Take 80 mg by mouth daily.  03/09/17  Yes Larey Dresser, MD  clobetasol cream (TEMOVATE) 8.41 % Apply 1 application topically daily as needed (skin irritation).  05/20/14  Yes [provider]  Coenzyme Q10 (CO Q 10 PO) Take 1 capsule by mouth daily.   Yes [provider]  Cyanocobalamin (VITAMIN B 12 PO) Take 1 tablet by mouth daily.   Yes [provider]  docusate sodium (COLACE) 100 MG capsule Take 100 mg by mouth daily.   Yes [provider]  dronedarone (MULTAQ) 400 MG tablet Take 1 tablet  by mouth 2  times daily with a meal. Patient taking differently: Take 400 mg by mouth 2 (two) times daily with a meal.  03/09/17  Yes Larey Dresser, MD  fish oil-omega-3 fatty acids 1000 MG capsule Take 1 g by mouth 2 (two) times daily.    Yes [provider]  glucosamine-chondroitin 500-400 MG tablet Take 1 tablet by mouth 2 (two) times daily.   Yes [provider]  hydrocortisone (ANUSOL-HC) 2.5 % rectal cream Place 1 application rectally 2 (two) times daily. Patient taking differently: Place 1 application rectally 2 (two) times daily as needed for hemorrhoids or anal itching.  05/26/16  Yes Golden Circle, FNP  losartan (COZAAR) 25 MG tablet Take 1 tablet (25 mg total) by mouth daily. 03/09/17  Yes Larey Dresser, MD  metoprolol succinate (TOPROL-XL) 50 MG 24 hr tablet TAKE 1 TABLET BY MOUTH 2  TIMES DAILY AT 8 AM AND 10  PM. Patient taking differently:  Take 50 mg by mouth 2 (two) times daily.  03/09/17  Yes Larey Dresser, MD  Multiple Vitamins-Minerals (MULTIVITAMIN PO) Take 1 tablet by mouth daily.   Yes [provider]  Probiotic Product (PROBIOTIC FORMULA PO) Take 2 capsules by mouth daily.    Yes [provider]  ranolazine (RANEXA) 500 MG 12 hr tablet Take 1 tablet (500 mg total) by mouth 2 (two) times daily. 03/09/17  Yes Larey Dresser, MD  sildenafil (VIAGRA) 100 MG tablet Take 1 tablet (100 mg total) by mouth as needed for erectile dysfunction. 10/27/14  Yes [name], MD  warfarin (COUMADIN) 5 MG tablet Take as directed by Coumadin Clinic Patient taking differently: Take 5-7.5 mg by mouth See admin instructions. Take 7.5 mg by mouth daily on Friday. Take 5 mg by mouth daily on all other days 03/09/17  Yes Larey Dresser, MD  loratadine (CLARITIN) 10 MG tablet Take 10 mg by mouth daily as needed for allergies.     [provider]  pregabalin (LYRICA) 50 MG capsule Take 1 capsule (50 mg total) by mouth 3 (three) times daily. Patient not taking: Reported on 04/21/2017 10/27/14   [name], MD  tiZANidine (ZANAFLEX) 4 MG tablet Take 0.5-1 tablets (2-4 mg total) by mouth every 6 (six) hours as needed for muscle spasms. 05/26/16   Golden Circle, FNP  Past Medical History: Past Medical History:  Diagnosis Date  . Acute rheumatic heart disease, unspecified    childhood, age  34 & 7  . Acute rheumatic pericarditis   . Atrial fibrillation ([place])    history  . Diverticulosis   . External hemorrhoids without mention of complication   . 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 Lesion of ulnar nerve   . 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 Other and unspecified hyperlipidemia   . Previous back surgery 1978, jan 2007  . Psychosexual dysfunction with inhibited sexual excitement   . SOB (shortness of breath)    "with heavy exercise"  . Stroke [facility])     Past Surgical  History: Past Surgical History:  Procedure Laterality Date  . AORTIC AND MITRAL VALVE REPLACEMENT     09/2004  . CARDIOVERSION     3 times from 2004-2006  . CARDIOVERSION N/A 09/26/2013   Procedure: CARDIOVERSION;  Surgeon: Larey Dresser, MD;  Location: [place];  Service: Cardiovascular;  Laterality: N/A;  . CARDIOVERSION N/A 06/19/2014   Procedure: CARDIOVERSION;  Surgeon: Jerline Pain, MD;  Location: [facility] ENDOSCOPY;  Service: Cardiovascular;  Laterality: N/A;  . COLONOSCOPY    . laminectomies     10/1975 and in 03/2005  . TONSILLECTOMY     1950    Family History:  Family History  Problem Relation Age of Onset  . Macular degeneration Mother        macular degeneration  . Cancer Father        intestinal/GI  . Diabetes Paternal Aunt   . Heart attack Maternal Grandfather   . Diabetes Brother     Social History: Social History   Socioeconomic History  . Marital status: Married    Spouse name: None  . Number of children: 2  . Years of education: BS  . Highest education level: None  Social Needs  . Financial resource strain: None  . Food insecurity - worry: None  . Food insecurity - inability: None  . Transportation needs - medical: None  . Transportation needs - non-medical: None  Occupational History  . Occupation: retired  Tobacco Use  . Smoking status: Never Smoker  . Smokeless tobacco: Never Used  Substance and Sexual Activity  . Alcohol use: Yes    Alcohol/week: 1.8 oz    Types: 1 Glasses of wine, 1 Cans of beer, 1 Shots of liquor per week    Comment: wine occasionally  . Drug use: No  . Sexual activity: Yes    Partners: Female  Other Topics Concern  . None  Social History Narrative   [employer] - BS, JD. married - '67. 2 sons - '74, '77  one son is a Nurse, learning disability for EMS - [employer]; 4 grandchildren. 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 marriage in good health. End-of-life:  discussed issues and provided packet with the MOST form and out of facility order.    Allergies:  Allergies  Allergen Reactions  . Naproxen Hives and Other (See Comments)    Aleve    Objective:    Vital Signs:   Temp:  [97.9 F (36.6 C)] 97.9 F (36.6 C) (02/18 1133) Resp:  [16] 16 (02/18 1133) BP: (134)/(90) 134/90 (02/18 1133) SpO2:  [98 %] 98 % (02/18 1133) Weight:  [192 lb (87.1 kg)] 192 lb (87.1 kg) (02/18 1133)   Filed  Weights   04/24/17 1133  Weight: 192 lb (87.1 kg)     Physical Exam     General:  Well appearing. No respiratory difficulty HEENT: Normal Neck: Supple. no JVD. Carotids 2+ bilat; no bruits. No lymphadenopathy or thyromegaly appreciated. Cor: PMI nondisplaced. Regular rate & rhythm. No rubs, gallops or murmurs. Lungs: Clear Abdomen: Soft, nontender, nondistended. No hepatosplenomegaly. No bruits or masses. Good bowel sounds. Extremities: No cyanosis, clubbing, rash, edema Neuro: Alert & oriented x 3, cranial nerves grossly intact. moves all 4 extremities w/o difficulty. Affect pleasant.   Telemetry   Atypical atrial flutter   Labs     Basic Metabolic Panel: No results for input(s): NA, K, CL, CO2, GLUCOSE, BUN, CREATININE, CALCIUM, MG, PHOS in the last 168 hours.  Liver Function Tests: No results for input(s): AST, ALT, ALKPHOS, BILITOT, PROT, ALBUMIN in the last 168 hours. No results for input(s): LIPASE, AMYLASE in the last 168 hours. No results for input(s): AMMONIA in the last 168 hours.  CBC: No results for input(s): WBC, NEUTROABS, HGB, HCT, MCV, PLT in the last 168 hours.  Cardiac Enzymes: No results for input(s): CKTOTAL, CKMB, CKMBINDEX, TROPONINI in the last 168 hours.  BNP: BNP (last 3 results) No results for input(s): BNP in the last 8760 hours.  ProBNP (last 3 results) No results for input(s): PROBNP in the last 8760 hours.   CBG: No results for input(s): GLUCAP in the last 168 hours.  Coagulation  Studies: Recent Labs    04/24/17 1005  INR 4.7    Imaging:  No results found.   Assessment/Plan   Patient here for DCCV.  INR has been therapeutic on warfarin and is supratherapeutic today.  Plan DCCV.    Loralie Champagne, MD 04/24/2017, 12:06 PM  Advanced Heart Failure Team Pager [phone] (M-F; 7a - 4p)  Please contact [facility] for night-coverage after hours (4p -7a ) and weekends on [email or web address]

## 2017-04-24 NOTE — Patient Instructions (Signed)
Description   Skip today and tomorrow's dosage of Coumadin, then resume same dosage 1 tablet every day except 1.5 tablets on Fridays. Recheck INR in 7-10 days.

## 2017-04-24 NOTE — Discharge Instructions (Signed)
Electrical Cardioversion, Care After °This sheet gives you information about how to care for yourself after your procedure. Your health care provider may also give you more specific instructions. If you have problems or questions, contact your health care provider. °What can I expect after the procedure? °After the procedure, it is common to have: °· Some redness on the skin where the shocks were given. ° °Follow these instructions at home: °· Do not drive for 24 hours if you were given a medicine to help you relax (sedative). °· Take over-the-counter and prescription medicines only as told by your health care provider. °· Ask your health care provider how to check your pulse. Check it often. °· Rest for 48 hours after the procedure or as told by your health care provider. °· Avoid or limit your caffeine use as told by your health care provider. °Contact a health care provider if: °· You feel like your heart is beating too quickly or your pulse is not regular. °· You have a serious muscle cramp that does not go away. °Get help right away if: °· You have discomfort in your chest. °· You are dizzy or you feel faint. °· You have trouble breathing or you are short of breath. °· Your speech is slurred. °· You have trouble moving an arm or leg on one side of your body. °· Your fingers or toes turn cold or blue. °This information is not intended to replace advice given to you by your health care provider. Make sure you discuss any questions you have with your health care provider. °Document Released: 12/12/2012 Document Revised: 09/25/2015 Document Reviewed: 08/28/2015 °Elsevier Interactive Patient Education © 2018 Elsevier Inc. ° °

## 2017-04-25 ENCOUNTER — Encounter (HOSPITAL_COMMUNITY): Payer: Self-pay | Admitting: Cardiology

## 2017-04-28 ENCOUNTER — Telehealth (HOSPITAL_COMMUNITY): Payer: Self-pay | Admitting: *Deleted

## 2017-04-28 ENCOUNTER — Other Ambulatory Visit: Payer: Self-pay

## 2017-04-28 ENCOUNTER — Ambulatory Visit (HOSPITAL_BASED_OUTPATIENT_CLINIC_OR_DEPARTMENT_OTHER)
Admission: RE | Admit: 2017-04-28 | Discharge: 2017-04-28 | Disposition: A | Discharge status: Home / Self Care | Payer: Medicare Other | Source: Ambulatory Visit | Attending: Cardiology | Admitting: Cardiology

## 2017-04-28 ENCOUNTER — Ambulatory Visit (HOSPITAL_COMMUNITY)
Admission: RE | Admit: 2017-04-28 | Discharge: 2017-04-28 | Disposition: A | Discharge status: Home / Self Care | Payer: Medicare Other | Source: Ambulatory Visit | Attending: Cardiology | Admitting: Cardiology

## 2017-04-28 VITALS — BP 123/83 | HR 96 | Ht 68.0 in | Wt 197.8 lb

## 2017-04-28 DIAGNOSIS — I77810 Thoracic aortic ectasia: Secondary | ICD-10-CM | POA: Insufficient documentation

## 2017-04-28 DIAGNOSIS — Z48812 Encounter for surgical aftercare following surgery on the circulatory system: Secondary | ICD-10-CM | POA: Diagnosis not present

## 2017-04-28 DIAGNOSIS — I080 Rheumatic disorders of both mitral and aortic valves: Secondary | ICD-10-CM | POA: Diagnosis present

## 2017-04-28 DIAGNOSIS — I4819 Other persistent atrial fibrillation: Secondary | ICD-10-CM

## 2017-04-28 DIAGNOSIS — I480 Paroxysmal atrial fibrillation: Secondary | ICD-10-CM | POA: Diagnosis not present

## 2017-04-28 DIAGNOSIS — I481 Persistent atrial fibrillation: Secondary | ICD-10-CM | POA: Diagnosis not present

## 2017-04-28 DIAGNOSIS — Z952 Presence of prosthetic heart valve: Secondary | ICD-10-CM

## 2017-04-28 DIAGNOSIS — E785 Hyperlipidemia, unspecified: Secondary | ICD-10-CM

## 2017-04-28 DIAGNOSIS — I482 Chronic atrial fibrillation, unspecified: Secondary | ICD-10-CM

## 2017-04-28 DIAGNOSIS — I10 Essential (primary) hypertension: Secondary | ICD-10-CM | POA: Diagnosis not present

## 2017-04-28 LAB — BASIC METABOLIC PANEL
Anion gap: 12 (ref 5–15)
BUN: 15 mg/dL (ref 6–20)
CHLORIDE: 108 mmol/L (ref 101–111)
CO2: 20 mmol/L — AB (ref 22–32)
Calcium: 8.8 mg/dL — ABNORMAL LOW (ref 8.9–10.3)
Creatinine, Ser: 1.01 mg/dL (ref 0.61–1.24)
GFR calc Af Amer: >60 mL/min (ref >60)
GLUCOSE: 111 mg/dL — AB (ref 65–99)
POTASSIUM: 4.2 mmol/L (ref 3.5–5.1)
Sodium: 140 mmol/L (ref 135–145)

## 2017-04-28 LAB — LIPID PANEL
CHOL/HDL RATIO: 3.4 ratio
CHOLESTEROL: 110 mg/dL (ref 0–200)
HDL: 32 mg/dL — ABNORMAL LOW (ref >40)
LDL CALC: 59 mg/dL (ref 0–99)
Triglycerides: 97 mg/dL (ref <150)
VLDL: 19 mg/dL (ref 0–40)

## 2017-04-28 LAB — CBC
HEMATOCRIT: 39.4 % (ref 39.0–52.0)
HEMOGLOBIN: 13.5 g/dL (ref 13.0–17.0)
MCH: 32.3 pg (ref 26.0–34.0)
MCHC: 34.3 g/dL (ref 30.0–36.0)
MCV: 94.3 fL (ref 78.0–100.0)
Platelets: 371 10*3/uL (ref 150–400)
RBC: 4.18 MIL/uL — AB (ref 4.22–5.81)
RDW: 13.5 % (ref 11.5–15.5)
WBC: 8.8 10*3/uL (ref 4.0–10.5)

## 2017-04-28 LAB — MAGNESIUM: Magnesium: 2 mg/dL (ref 1.7–2.4)

## 2017-04-28 MED ORDER — LOSARTAN POTASSIUM 25 MG PO TABS: 25.0000 mg | ORAL_TABLET | Freq: Every day | ORAL | 3 refills | Status: DC

## 2017-04-28 NOTE — Patient Instructions (Signed)
You have been referred to AFIB Clinic (they will call you)  Labs drawn today (if we do not call you, then your lab work was stable)   Your physician recommends that you schedule a follow-up appointment in: 6 weeks with Dr. [name]

## 2017-04-28 NOTE — Progress Notes (Signed)
  Echocardiogram 2D Echocardiogram has been performed.  [name] [name] 04/28/2017, 11:05 AM

## 2017-04-28 NOTE — Telephone Encounter (Signed)
Left msg for pt to clbk to sched follow up appt in afib clinic per Dr. [name] - 1 week

## 2017-04-30 NOTE — Progress Notes (Signed)
Patient ID: [name] [name], male   DOB: [birth year], 77 y.o.   MRN: [id number] PCP: Dr. Maudie Mercury Cardiology: Dr. [name]  77 y.o. 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  He also has an ascending aortic aneurysm.   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  I started him on dronedarone and coumadin with plan for cardioversion after 4 wks of therapeutic anticoagulation if he did not convert back to NSR on his own.  He went back into NSR spontaneously and later stopped coumadin. Atrial flutter recurred in 3/15 and again resolved spontaneously.  In 6/15, patient was admitted with transient dysarthria (resolved in about 10 minutes).  However, he was found to have evidence for CVA by MRI.  Therefore, he was started on coumadin. He was noted to be in atypical atrial flutter again.  Ranolazine was added to dronedarone.    In 8/15, [name] was seen in consultation at the [facility] clinic.  He had atrial flutter ablation at [facility] in 11/15.    He was seen in followup at [facility] in 2/16.  Ranolazine was stopped.  Echo in 2/16 showed EF 55% with stable bioprosthetic mitral and aortic valves.  CTA chest showed stable 5 cm ascending aortic aneurysm.  He went back into atypical atrial flutter in 4/16.  Ranolazine was restarted and he was cardioverted back to NSR.    Last visit to [facility] was in 5/18.  Echo at that time showed EF 60-65% with stable bioprosthetic mitral and aortic valves.  CTA showed 5.0 cm aortic root and ascending aorta.  Cardiolite in 12/18 showed no ischemia. He wore a 30 day monitor in 12/18 with no arrhythmia.    A couple of weeks ago, he went into atrial flutter again during a respiratory illness.  On 04/24/17, he was cardioverted back to NSR.  He thinks  that he went into atrial flutter again later on that day. He is in atrial flutter today.   He returns for followup of atrial flutter and valvular disease.  He is more fatigued when out of rhythm, notes that exercise is harder and wears him out faster though he is not short of breath per se.  No chest pain. No lightheadedness.  He does not feel palpitations, just notes the fatigue.   ECG (12/12/11): Atypical atrial flutter versus atrial tachycardia with rate around 100 bpm, QTc normal.  ECG (12/29/11): NSR, 1st degree AV block PR 242 msec.    ECG (12/13): NSR, 1st degree AV block PR 244 msec    ECG (10/14): NSR, 1st degree AV block ECG (3/15): Atypical atrial flutter with rate 87, inferior T wave inversions ECG (6/15): Atypical atrial flutter with rate 77 ECG (8/15): atypical atrial flutter rate 82 ECG (10/15): NSR, 1st degree AV block ECG (12/15): NSR, 1st degree AV block, QTc 410 msec ECG (5/16): NSR, 1st degree AV block, QTc 419 msec ECG (5/17): NSR, 1st degree AV block 270 msec, QTc 433 ECG (2/18): NSR, 1st degree AVB, nonspecific ST-T changes, QTc 435 msec ECG (11/18): NSR, 1st degree AVB, QTc 424 msec ECG (2/19, personally reviewed): atypical atrial flutter at 87, QTc 442 msec  Labs (3/14): LDL 64, TSH normal, K 3.9, creatinine 1.0     Labs (6/15): LDL 54, HDL 48, creatinine 0.95  Labs (9/15): LDL 54, HDL 25 Labs (10/15): K 4.5, creatinine 1.3, AST 45, ALT 64 Labs (4/16): K 4.3, creatinine 1.28, HCT 40.7 Labs (5/17): K 4.1, creatinine 1.16, LDL 66, HDL 47 Labs (2/18): LDL 42, HDL 43, K 4.4, creatinine 1.48 Labs (3/18): hgb 13.8                                                                                                                                 PMH: 1. Atrial fibrillation: s/p MAZE and LAA ligation in 2006.  No documented atrial fibrillation since operation.  2. Atypical atrial flutter: 10/13, 3/15, and again in 6/15.  Recurrent 4/16 with DCCV.  - Event monitor (12/18):  no significant arrhythmia.  - Recurrence in 2/19: DCCV to NSR but quickly recurred.  3. 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   - Echo 5/11 with EF 60-65%, normal bioprosthetic aortic valve, normal bioprosthetic mitral valve, moderate LAE.   - Echo 11/12 at [facility] looked ok per report.  - Echo (10/13) with EF 55-60%, mild LVH, normal bioprosthetic aortic and mitral valves, moderate LAE, PA systolic pressure 35 mmHg.   - Echo (6/15) with EF 60%, moderate LVH, normal-appearing bioprosthetic MV and AoV, PA systolic pressure 32 mmHg.   - Echo 9/15 [facility]) with EF 55%, mild LVH, PASP 52 mmHg, severe LAE, bioprosthetic mitral valve with no MR and mean gradient 11 mmHg, moderate TR, bioprosthetic aortic valve with no AI and mean gradient 9 mmHg, dilated ascending aorta to 5 cm.   - Echo (2/16, [facility]) with EF 55%, stable bioprosthetic aortic and mitral valves (AoV mean gradient 7 mmHg, MV mean gradient 4 mmHg), normal RV size and systolic function.  - Echo (12/16, [facility]) with EF 58%, mild RV dilation with low normal systolic function, PA systolic pressure 34 mmHg, bioprosthetic MV and AoV functioning normally.  - Echo (5/18, [facility]) with EF 60-65%, RV normal size and systolic function, moderate LAE, bioprosthetic MV mean gradient 6 with no MR, bioprosthetic aortic valve with mean gradient 11 and no AI, PASP 41 mmHg.  - Echo (2/19): EF 60-65%, moderate LVH, bioprosthetic aortic and mitral valves functioning normally, PASP 23 mmHg, aortic root 4.5 cm.  4. Hyperlipidemia 5. L-spine surgery 6. LHC with no significant coronary disease in 2006 pre-op.  - Cardiolite (12/18): No ischemia.  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  CTA chest 9/15 with dilated sinuses of valsalva to 5.0 cm and ascending  aorta 4.9 cm. CTA chest (2/16) with 5.0 cm ascending aorta.  CTA chest (12/16) with 4.9 cm aortic root at SOV, 4.8 cm ascending aorta.  - CT chest (5/18, [facility]): 5.0 cm aortic root, 5.0 cm ascending aorta.   SH: Married, retired Forensic psychologist [name], [place], [place]).  2 sons.  Never smoked.  FH: No premature CAD.   ROS: All systems reviewed and negative except as per HPI.  Current Outpatient Medications  Medication Sig Dispense Refill  . atorvastatin (LIPITOR) 80 MG tablet Take 1 tablet by mouth  daily (Patient taking differently: Take 80 mg by mouth daily. ) 90 tablet 3  . clobetasol cream (TEMOVATE) 4.43 % Apply 1 application topically daily as needed (skin irritation).     . Coenzyme Q10 (CO Q 10 PO) Take 1 capsule by mouth daily.    . Cyanocobalamin (VITAMIN B 12 PO) Take 1 tablet by mouth daily.    [name] docusate sodium (COLACE) 100 MG capsule Take 100 mg by mouth daily.    [name] dronedarone (MULTAQ) 400 MG tablet Take 1 tablet  by mouth 2  times daily with a meal. (Patient taking differently: Take 400 mg by mouth 2 (two) times daily with a meal. ) 180 tablet 3  . fish oil-omega-3 fatty acids 1000 MG capsule Take 1 g by mouth 2 (two) times daily.     [name] glucosamine-chondroitin 500-400 MG tablet Take 1 tablet by mouth 2 (two) times daily.    . hydrocortisone (ANUSOL-HC) 2.5 % rectal cream Place 1 application rectally 2 (two) times daily. (Patient taking differently: Place 1 application rectally 2 (two) times daily as needed for hemorrhoids or anal itching. ) 30 g 0  . loratadine (CLARITIN) 10 MG tablet Take 10 mg by mouth daily as needed for allergies.     [name] losartan (COZAAR) 25 MG tablet Take 1 tablet (25 mg total) by mouth daily. 90 tablet 3  . metoprolol succinate (TOPROL-XL) 50 MG 24 hr tablet TAKE 1 TABLET BY MOUTH 2  TIMES DAILY AT 8 AM AND 10  PM. (Patient taking differently: Take 50 mg by mouth 2 (two) times daily. ) 180 tablet 3  . Multiple Vitamins-Minerals (MULTIVITAMIN PO)  Take 1 tablet by mouth daily.    . pregabalin (LYRICA) 50 MG capsule Take 1 capsule (50 mg total) by mouth 3 (three) times daily. 63 capsule 0  . Probiotic Product (PROBIOTIC FORMULA PO) Take 2 capsules by mouth daily.     . ranolazine (RANEXA) 500 MG 12 hr tablet Take 1 tablet (500 mg total) by mouth 2 (two) times daily. 180 tablet 3  . sildenafil (VIAGRA) 100 MG tablet Take 1 tablet (100 mg total) by mouth as needed for erectile dysfunction. 10 tablet 6  . tiZANidine (ZANAFLEX) 4 MG tablet Take 0.5-1 tablets (2-4 mg total) by mouth every 6 (six) hours as needed for muscle spasms. 30 tablet 0  . warfarin (COUMADIN) 5 MG tablet Take as directed by Coumadin Clinic (Patient taking differently: Take 5-7.5 mg by mouth See admin instructions. Take 7.5 mg by mouth daily on Friday. Take 5 mg by mouth daily on all other days) 120 tablet 1   No current facility-administered medications for this encounter.     BP 123/83 (BP Location: Right Arm, Patient Position: Sitting, Cuff Size: Normal)   Pulse 96   Ht 5\' 8"  (1.727 m)   Wt 197 lb 12.8 oz (89.7 kg)   SpO2 99%   BMI 30.08 kg/m  General: NAD Neck: No JVD, no thyromegaly or thyroid nodule.  Lungs: Clear to auscultation bilaterally with normal respiratory effort. CV: Nondisplaced PMI.  Heart irregular S1/S2, no S3/S4, 1/6 SEM RUSB.  No peripheral edema.  No carotid bruit.  Normal pedal pulses.  Abdomen: Soft, nontender, no hepatosplenomegaly, no distention.  Skin: Intact without lesions or rashes.  Neurologic: Alert and oriented x 3.  Psych: Normal affect. Extremities: No clubbing or cyanosis.  HEENT: Normal.   Assessment/Plan: 1. Atypical atrial flutter: Patient had a MAZE procedure with LAA ligation at the time of his valve replacement.  Since then, he has had occasional episodes of atypical atrial flutter. He had episodes in 10/13 and 3/15 that converted back to NSR on dronedarone and ranolazine. He was noted to be back in atrial flutter in 6/15  in the setting of a CVA.  He had ablation at [facility] in 11/15.  Atrial flutter recurred after ranolazine was stopped and he had DCCV again in 4/16.  Atrial flutter recurred in 2/19, DCCV back to NSR but atrial flutter quickly recurred. He is in atrial flutter today.  - Continue coumadin. This will need to be continued long term even with ablation given CVA history. Check CBC.  - Continue current Toprol XL. - QTc is ok on ECG today.  I am going to refer him to atrial fibrillation clinic.  I would like to take him off dronedarone/ranolazine and put him on Tikosyn.  He will have to be admitted for this and will be cardioverted if he does not convert to NSR with dofetilide. This will be arranged from atrial fibrillation clinic.  - Another flutter ablation is a consideration, he is going to go to the [facility] again some time this Spring, will re-address with his EP physician there.   2. 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  I reviewed today's echo, EF is normal and the prosthetic valves are functioning normally.  3. HTN: BP needs good control given ascending aortic aneurysm.  - Continue losartan and Toprol XL.    4. Hyperlipidemia: Check lipids today.    5. CVA: Suspect this was related to atrial flutter (despite LA appendage ligation).  He is now on coumadin. No residual neurologic deficits.  Continue long-term anticoagulation. 6. Thoracic aortic aneurysm: Dilated aortic root at sinuses of valsalva and ascending aorta to 5 cm, last CTA in 5/18 (stable).   - He will need CTA chest again in 5/19 to follow aneurysm.    - Continue Toprol XL given some evidence that beta blockers may attenuate thoracic aortic aneurysm growth.   Labs today, BMET, CBC, lipids, Mg.   Followup 6 wks.   Loralie Champagne 04/30/2017

## 2017-05-01 ENCOUNTER — Ambulatory Visit (INDEPENDENT_AMBULATORY_CARE_PROVIDER_SITE_OTHER): Payer: Medicare Other | Admitting: *Deleted

## 2017-05-01 ENCOUNTER — Encounter (HOSPITAL_COMMUNITY): Payer: Self-pay

## 2017-05-01 DIAGNOSIS — Z5181 Encounter for therapeutic drug level monitoring: Secondary | ICD-10-CM | POA: Diagnosis not present

## 2017-05-01 DIAGNOSIS — I482 Chronic atrial fibrillation, unspecified: Secondary | ICD-10-CM

## 2017-05-01 DIAGNOSIS — I4892 Unspecified atrial flutter: Secondary | ICD-10-CM | POA: Diagnosis not present

## 2017-05-01 LAB — POCT INR: INR: 1.9

## 2017-05-01 NOTE — Patient Instructions (Signed)
Description   Today Feb 25th take 1 and 1/2 tablets (7.5mg ) then continue same dosage 1 tablet every day except 1and 1/2 tablets on Fridays. Recheck INR in 2 weeks.

## 2017-05-02 ENCOUNTER — Encounter (HOSPITAL_COMMUNITY): Payer: Self-pay | Admitting: Nurse Practitioner

## 2017-05-02 ENCOUNTER — Ambulatory Visit (HOSPITAL_COMMUNITY)
Admission: RE | Admit: 2017-05-02 | Discharge: 2017-05-02 | Disposition: A | Discharge status: Home / Self Care | Payer: Medicare Other | Source: Ambulatory Visit | Attending: Nurse Practitioner | Admitting: Nurse Practitioner

## 2017-05-02 ENCOUNTER — Telehealth: Payer: Self-pay | Admitting: Pharmacist

## 2017-05-02 VITALS — BP 132/74 | HR 75 | Ht 68.0 in | Wt 196.0 lb

## 2017-05-02 DIAGNOSIS — Z7901 Long term (current) use of anticoagulants: Secondary | ICD-10-CM | POA: Diagnosis not present

## 2017-05-02 DIAGNOSIS — Z886 Allergy status to analgesic agent status: Secondary | ICD-10-CM | POA: Diagnosis not present

## 2017-05-02 DIAGNOSIS — Z8673 Personal history of transient ischemic attack (TIA), and cerebral infarction without residual deficits: Secondary | ICD-10-CM | POA: Diagnosis not present

## 2017-05-02 DIAGNOSIS — I480 Paroxysmal atrial fibrillation: Secondary | ICD-10-CM | POA: Insufficient documentation

## 2017-05-02 DIAGNOSIS — Z79899 Other long term (current) drug therapy: Secondary | ICD-10-CM | POA: Insufficient documentation

## 2017-05-02 DIAGNOSIS — E785 Hyperlipidemia, unspecified: Secondary | ICD-10-CM | POA: Diagnosis not present

## 2017-05-02 DIAGNOSIS — I4892 Unspecified atrial flutter: Secondary | ICD-10-CM | POA: Diagnosis not present

## 2017-05-02 DIAGNOSIS — Z952 Presence of prosthetic heart valve: Secondary | ICD-10-CM | POA: Diagnosis not present

## 2017-05-02 LAB — PROTIME-INR
INR: 1.77
PROTHROMBIN TIME: 20.5 s — AB (ref 11.4–15.2)

## 2017-05-02 NOTE — Telephone Encounter (Signed)
Medication list reviewed in anticipation of upcoming Tikosyn initiation. Patient is taking Ranexa which can slightly increase concentrations of Tikosyn however is not contraindicated. Per Butch Penny, will plan to stop Multaq 3 days prior to Tikosyn start.  Patient is anticoagulated on warfarin. INR was subtherapeutic today at 1.77. Pt has been advised to take an extra half tablet today then resume his normal dose. Follow up INR scheduled for next Monday. Will need 4 therapeutic weekly readings prior to Tikosyn start.  Patient will need to be counseled to avoid use of Benadryl while on Tikosyn and in the 2-3 days prior to Tikosyn initiation.

## 2017-05-03 ENCOUNTER — Ambulatory Visit (HOSPITAL_COMMUNITY): Payer: Medicare Other | Admitting: Nurse Practitioner

## 2017-05-03 NOTE — Progress Notes (Signed)
Primary Care Physician: [name], MD Referring Physician: Dr. Lynn Ito [name] [name] is a 77 y.o. 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     He was seen in followup at [facility] in 2/16.  Ranolazine was stopped.  Echo in 2/16 showed EF 55% with stable bioprosthetic mitral and aortic valves.  CTA chest showed stable 5 cm ascending aortic aneurysm.  He went back into atypical atrial flutter in 4/16.  Ranolazine was restarted and he was cardioverted back to NSR.    A couple of weeks ago, he went into atrial flutter again during a respiratory illness.  On 04/24/17, he was cardioverted back to NSR.  He thinks that he went into atrial flutter again later on that day. He was in atrial flutter on f/u with Dr. [name] 2/22.  He is in the afib clinic today to discuss admit for tikosyn. He is in SR today. He continues on multaq and ranolazine. He is on warfarin and was subtherapeutic yesterday at 1.9 and today at 1.7 Coumadin clinic is adjusting coumadin..  Today, he denies symptoms of palpitations, chest pain, shortness of breath, orthopnea, PND, lower extremity edema, dizziness, presyncope, syncope, or neurologic sequela. The patient is tolerating medications without difficulties and is otherwise without complaint today.   Past Medical History:  Diagnosis Date  . Acute rheumatic heart disease, unspecified    childhood, age  20 & 87  . Acute rheumatic pericarditis   . Atrial fibrillation ([place])    history  . Diverticulosis   . External hemorrhoids without mention of complication   . 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 Lesion of ulnar nerve   . 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 Other and unspecified hyperlipidemia   . Previous back surgery 1978, jan 2007  . Psychosexual dysfunction with inhibited sexual  excitement   . SOB (shortness of breath)    "with heavy exercise"  . Stroke [facility])    Past Surgical History:  Procedure Laterality Date  . AORTIC AND MITRAL VALVE REPLACEMENT     09/2004  . CARDIOVERSION     3 times from 2004-2006  . CARDIOVERSION N/A 09/26/2013   Procedure: CARDIOVERSION;  Surgeon: Larey Dresser, MD;  Location: [place];  Service: Cardiovascular;  Laterality: N/A;  . CARDIOVERSION N/A 06/19/2014   Procedure: CARDIOVERSION;  Surgeon: Jerline Pain, MD;  Location: [facility] ENDOSCOPY;  Service: Cardiovascular;  Laterality: N/A;  . CARDIOVERSION N/A 04/24/2017   Procedure: CARDIOVERSION;  Surgeon: Larey Dresser, MD;  Location: [facility] ENDOSCOPY;  Service: Cardiovascular;  Laterality: N/A;  . COLONOSCOPY    . laminectomies     10/1975 and in 03/2005  . TONSILLECTOMY     1950    Current Outpatient Medications  Medication Sig Dispense Refill  . atorvastatin (LIPITOR) 80 MG tablet Take 1 tablet by mouth  daily (Patient taking differently: Take 80 mg by mouth daily. ) 90 tablet 3  . clobetasol cream (TEMOVATE) 2.77 % Apply 1 application topically daily as needed (skin irritation).     . Coenzyme Q10 (CO Q 10 PO) Take 1 capsule by mouth daily.    . Cyanocobalamin (VITAMIN B 12 PO) Take 1 tablet by mouth daily.    [name] docusate sodium (COLACE) 100 MG capsule Take  100 mg by mouth daily.    [name] dronedarone (MULTAQ) 400 MG tablet Take 1 tablet  by mouth 2  times daily with a meal. (Patient taking differently: Take 400 mg by mouth 2 (two) times daily with a meal. ) 180 tablet 3  . fish oil-omega-3 fatty acids 1000 MG capsule Take 1 g by mouth 2 (two) times daily.     [name] glucosamine-chondroitin 500-400 MG tablet Take 1 tablet by mouth 2 (two) times daily.    . hydrocortisone (ANUSOL-HC) 2.5 % rectal cream Place 1 application rectally 2 (two) times daily. (Patient taking differently: Place 1 application rectally 2 (two) times daily as needed for hemorrhoids or anal itching. ) 30 g 0  .  losartan (COZAAR) 25 MG tablet Take 1 tablet (25 mg total) by mouth daily. 90 tablet 3  . metoprolol succinate (TOPROL-XL) 50 MG 24 hr tablet TAKE 1 TABLET BY MOUTH 2  TIMES DAILY AT 8 AM AND 10  PM. (Patient taking differently: Take 50 mg by mouth 2 (two) times daily. ) 180 tablet 3  . Multiple Vitamins-Minerals (MULTIVITAMIN PO) Take 1 tablet by mouth daily.    . Probiotic Product (PROBIOTIC FORMULA PO) Take 2 capsules by mouth daily.     . ranolazine (RANEXA) 500 MG 12 hr tablet Take 1 tablet (500 mg total) by mouth 2 (two) times daily. 180 tablet 3  . sildenafil (VIAGRA) 100 MG tablet Take 1 tablet (100 mg total) by mouth as needed for erectile dysfunction. 10 tablet 6  . warfarin (COUMADIN) 5 MG tablet Take as directed by Coumadin Clinic (Patient taking differently: Take 5-7.5 mg by mouth See admin instructions. Take 7.5 mg by mouth daily on Friday. Take 5 mg by mouth daily on all other days) 120 tablet 1  . loratadine (CLARITIN) 10 MG tablet Take 10 mg by mouth daily as needed for allergies.     . pregabalin (LYRICA) 50 MG capsule Take 1 capsule (50 mg total) by mouth 3 (three) times daily. (Patient not taking: Reported on 05/02/2017) 63 capsule 0  . tiZANidine (ZANAFLEX) 4 MG tablet Take 0.5-1 tablets (2-4 mg total) by mouth every 6 (six) hours as needed for muscle spasms. (Patient not taking: Reported on 05/02/2017) 30 tablet 0   No current facility-administered medications for this encounter.     Allergies  Allergen Reactions  . Naproxen Hives and Other (See Comments)    Aleve    Social History   Socioeconomic History  . Marital status: Married    Spouse name: Not on file  . Number of children: 2  . Years of education: BS  . Highest education level: Not on file  Social Needs  . Financial resource strain: Not on file  . Food insecurity - worry: Not on file  . Food insecurity - inability: Not on file  . Transportation needs - medical: Not on file  . Transportation needs -  non-medical: Not on file  Occupational History  . Occupation: retired  Tobacco Use  . Smoking status: Never Smoker  . Smokeless tobacco: Never Used  Substance and Sexual Activity  . Alcohol use: Yes    Alcohol/week: 1.8 oz    Types: 1 Glasses of wine, 1 Cans of beer, 1 Shots of liquor per week    Comment: wine occasionally  . Drug use: No  . Sexual activity: Yes    Partners: Female  Other Topics Concern  . Not on file  Social History Narrative   [employer] -  BS, JD. married - '67. 2 sons - '74, '77  one son is a Nurse, learning disability for [employer]; 4 grandchildren. 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 marriage in good health. End-of-life: discussed issues and provided packet with the MOST form and out of facility order.    Family History  Problem Relation Age of Onset  . Macular degeneration Mother        macular degeneration  . Cancer Father        intestinal/GI  . Diabetes Paternal Aunt   . Heart attack Maternal Grandfather   . Diabetes Brother     ROS- All systems are reviewed and negative except as per the HPI above  Physical Exam: Vitals:   05/02/17 1411  BP: 132/74  Pulse: 75  Weight: 196 lb (88.9 kg)  Height: 5\' 8"  (1.727 m)   Wt Readings from Last 3 Encounters:  05/02/17 196 lb (88.9 kg)  04/28/17 197 lb 12.8 oz (89.7 kg)  04/24/17 192 lb (87.1 kg)    Labs: Lab Results  Component Value Date   NA 140 04/28/2017   K 4.2 04/28/2017   CL 108 04/28/2017   CO2 20 (L) 04/28/2017   GLUCOSE 111 (H) 04/28/2017   BUN 15 04/28/2017   CREATININE 1.01 04/28/2017   CALCIUM 8.8 (L) 04/28/2017   MG 2.0 04/28/2017   Lab Results  Component Value Date   INR 1.77 05/02/2017   Lab Results  Component Value Date   CHOL 110 04/28/2017   HDL 32 (L) 04/28/2017   LDLCALC 59 04/28/2017   TRIG 97 04/28/2017     GEN- The patient is well appearing, alert and oriented x 3 today.   Head-  normocephalic, atraumatic Eyes-  Sclera clear, conjunctiva pink Ears- hearing intact Oropharynx- clear Neck- supple, no JVP Lymph- no cervical lymphadenopathy Lungs- Clear to ausculation bilaterally, normal work of breathing Heart- Regular rate and rhythm, no murmurs, rubs or gallops, PMI not laterally displaced GI- soft, NT, ND, + BS Extremities- no clubbing, cyanosis, or edema MS- no significant deformity or atrophy Skin- no rash or lesion Psych- euthymic mood, full affect Neuro- strength and sensation are intact  EKG-Sinus rhythm at 75 bpm, first degree AV block, pr int 248 ms, qrs int 104 ms, qtc 451 ms Epic records reviewed    Assessment and Plan: 1. Paroxysmal afib/flutter Currently back in SR Discussed admission for tikosyn, risk vrs benefit of drug He will check on price of drug but does not think it will be a problem He will need 4 therapeutic INR levels before admit and will coordinate with the Coumadin clinic  I offered TEE to shorten the process but he deferred that option For now continue Multaq, but he will need to stop at least 3 days before admit He was asked not to take any benadryl for 3 days prior to admit and after tikosyn  qtc ok at 451 ms   Drugs to be screened by PharmD Last mag/k+ at good levels for Tikosyn   Will arrange day for admit as his therapeutic warfarin levels are received  Butch Penny C. Tahirah Sara, [facility] [address] [place], [place] [address] [phone]

## 2017-05-08 ENCOUNTER — Ambulatory Visit (INDEPENDENT_AMBULATORY_CARE_PROVIDER_SITE_OTHER): Payer: Medicare Other | Admitting: *Deleted

## 2017-05-08 DIAGNOSIS — Z5181 Encounter for therapeutic drug level monitoring: Secondary | ICD-10-CM | POA: Diagnosis not present

## 2017-05-08 DIAGNOSIS — I4892 Unspecified atrial flutter: Secondary | ICD-10-CM

## 2017-05-08 LAB — POCT INR: INR: 2.6

## 2017-05-08 NOTE — Patient Instructions (Signed)
Description   Change your dose to 1 tablet daily except 1.5 tablets on Mondays and Fridays. Recheck INR in 1 week. Pending Tikosyn admission.

## 2017-05-15 ENCOUNTER — Ambulatory Visit (INDEPENDENT_AMBULATORY_CARE_PROVIDER_SITE_OTHER): Payer: Medicare Other | Admitting: Pharmacist

## 2017-05-15 DIAGNOSIS — I4892 Unspecified atrial flutter: Secondary | ICD-10-CM

## 2017-05-15 LAB — POCT INR: INR: 3.7

## 2017-05-15 NOTE — Patient Instructions (Signed)
Description   Take 1/2 tablet today, then continue same dose of 1 tablet daily except 1.5 tablets on Mondays and Fridays. Already ate greens today. Pending Tikosyn admission.

## 2017-05-17 NOTE — Telephone Encounter (Signed)
Patient instructed to stop multaq 3 days prior to admission for tikosyn. Will stop on 3/29 for 4/2 admit. Pt verbalized understanding.

## 2017-05-22 ENCOUNTER — Encounter: Payer: Self-pay | Admitting: Internal Medicine

## 2017-05-22 ENCOUNTER — Ambulatory Visit (INDEPENDENT_AMBULATORY_CARE_PROVIDER_SITE_OTHER): Payer: Medicare Other | Admitting: Internal Medicine

## 2017-05-22 ENCOUNTER — Other Ambulatory Visit (INDEPENDENT_AMBULATORY_CARE_PROVIDER_SITE_OTHER): Payer: Medicare Other

## 2017-05-22 ENCOUNTER — Ambulatory Visit (INDEPENDENT_AMBULATORY_CARE_PROVIDER_SITE_OTHER): Payer: Medicare Other | Admitting: *Deleted

## 2017-05-22 VITALS — BP 110/60 | HR 65 | Temp 98.1°F | Resp 16 | Ht 68.0 in | Wt 198.8 lb

## 2017-05-22 DIAGNOSIS — N401 Enlarged prostate with lower urinary tract symptoms: Secondary | ICD-10-CM

## 2017-05-22 DIAGNOSIS — I482 Chronic atrial fibrillation, unspecified: Secondary | ICD-10-CM

## 2017-05-22 DIAGNOSIS — I10 Essential (primary) hypertension: Secondary | ICD-10-CM | POA: Diagnosis not present

## 2017-05-22 DIAGNOSIS — I4892 Unspecified atrial flutter: Secondary | ICD-10-CM | POA: Diagnosis not present

## 2017-05-22 DIAGNOSIS — Z5181 Encounter for therapeutic drug level monitoring: Secondary | ICD-10-CM | POA: Diagnosis not present

## 2017-05-22 DIAGNOSIS — R7303 Prediabetes: Secondary | ICD-10-CM

## 2017-05-22 DIAGNOSIS — R972 Elevated prostate specific antigen [PSA]: Secondary | ICD-10-CM

## 2017-05-22 DIAGNOSIS — N138 Other obstructive and reflux uropathy: Secondary | ICD-10-CM | POA: Diagnosis not present

## 2017-05-22 DIAGNOSIS — Z23 Encounter for immunization: Secondary | ICD-10-CM | POA: Diagnosis not present

## 2017-05-22 DIAGNOSIS — Z0000 Encounter for general adult medical examination without abnormal findings: Secondary | ICD-10-CM

## 2017-05-22 LAB — THYROID PANEL WITH TSH
Free Thyroxine Index: 1.8 (ref 1.4–3.8)
T3 UPTAKE: 28 % (ref 22–35)
T4, Total: 6.3 ug/dL (ref 4.9–10.5)
TSH: 2.82 mIU/L (ref 0.40–4.50)

## 2017-05-22 LAB — POCT INR: INR: 2.6

## 2017-05-22 LAB — HEMOGLOBIN A1C: Hgb A1c MFr Bld: 5.9 % (ref 4.6–6.5)

## 2017-05-22 NOTE — Progress Notes (Signed)
Subjective:  Patient ID: [name] [name], male    DOB: [birth year]  Age: 77 y.o. MRN: [id number]  CC: Atrial Fibrillation; Annual Exam; and Hypertension   HPI [name] [name] presents for a CPX.  He feels well today and offers no complaints.  He tells me his blood pressure has been well controlled.  He said no recent episodes of palpitations, shortness of breath, chest pain, edema, or fatigue.  He recently saw his cardiologist and his lipids were excellent.  His renal function and electrolytes were also normal.  He has not had TFTs done for over 3 years.  He saw his urologist about 3 months ago and he tells me his prostate exam was normal and his PSA was at 6.  He was told he does not need to go undergo a prostate biopsy.  Past Medical History:  Diagnosis Date  . Acute rheumatic heart disease, unspecified    childhood, age  30 & 48  . Acute rheumatic pericarditis   . Atrial fibrillation ([place])    history  . Diverticulosis   . External hemorrhoids without mention of complication   . 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 Lesion of ulnar nerve   . 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 Other and unspecified hyperlipidemia   . Previous back surgery 1978, jan 2007  . Psychosexual dysfunction with inhibited sexual excitement   . SOB (shortness of breath)    "with heavy exercise"  . Stroke [facility])    Past Surgical History:  Procedure Laterality Date  . AORTIC AND MITRAL VALVE REPLACEMENT     09/2004  . CARDIOVERSION     3 times from 2004-2006  . CARDIOVERSION N/A 09/26/2013   Procedure: CARDIOVERSION;  Surgeon: Larey Dresser, MD;  Location: [place];  Service: Cardiovascular;  Laterality: N/A;  . CARDIOVERSION N/A 06/19/2014   Procedure: CARDIOVERSION;  Surgeon: Jerline Pain, MD;  Location: [facility] ENDOSCOPY;  Service: Cardiovascular;  Laterality: N/A;  . CARDIOVERSION N/A 04/24/2017   Procedure: CARDIOVERSION;  Surgeon: Larey Dresser, MD;  Location: [facility] ENDOSCOPY;   Service: Cardiovascular;  Laterality: N/A;  . COLONOSCOPY    . laminectomies     10/1975 and in 03/2005  . TONSILLECTOMY     1950    reports that  has never smoked. he has never used smokeless tobacco. He reports that he drinks about 1.8 oz of alcohol per week. He reports that he does not use drugs. family history includes Cancer in his father; Diabetes in his brother and paternal aunt; Heart attack in his maternal grandfather; Macular degeneration in his mother. Allergies  Allergen Reactions  . Naproxen Hives and Other (See Comments)    Aleve    Outpatient Medications Prior to Visit  Medication Sig Dispense Refill  . atorvastatin (LIPITOR) 80 MG tablet Take 1 tablet by mouth  daily (Patient taking differently: Take 80 mg by mouth daily. ) 90 tablet 3  . clobetasol cream (TEMOVATE) 1.24 % Apply 1 application topically daily as needed (skin irritation).     . Coenzyme Q10 (CO Q 10 PO) Take 1 capsule by mouth daily.    . Cyanocobalamin (VITAMIN B 12 PO) Take 1 tablet by mouth daily.    [name] docusate sodium (COLACE) 100 MG capsule Take 100 mg by mouth daily.    [name] dronedarone (MULTAQ) 400 MG tablet Take 1 tablet  by mouth 2  times daily with a meal. (Patient  taking differently: Take 400 mg by mouth 2 (two) times daily with a meal. ) 180 tablet 3  . fish oil-omega-3 fatty acids 1000 MG capsule Take 1 g by mouth 2 (two) times daily.     [name] glucosamine-chondroitin 500-400 MG tablet Take 1 tablet by mouth 2 (two) times daily.    . hydrocortisone (ANUSOL-HC) 2.5 % rectal cream Place 1 application rectally 2 (two) times daily. (Patient taking differently: Place 1 application rectally 2 (two) times daily as needed for hemorrhoids or anal itching. ) 30 g 0  . loratadine (CLARITIN) 10 MG tablet Take 10 mg by mouth daily as needed for allergies.     [name] losartan (COZAAR) 25 MG tablet Take 1 tablet (25 mg total) by mouth daily. 90 tablet 3  . metoprolol succinate (TOPROL-XL) 50 MG 24 hr tablet TAKE 1 TABLET  BY MOUTH 2  TIMES DAILY AT 8 AM AND 10  PM. (Patient taking differently: Take 50 mg by mouth 2 (two) times daily. ) 180 tablet 3  . pregabalin (LYRICA) 50 MG capsule Take 1 capsule (50 mg total) by mouth 3 (three) times daily. 63 capsule 0  . Probiotic Product (PROBIOTIC FORMULA PO) Take 2 capsules by mouth daily.     . ranolazine (RANEXA) 500 MG 12 hr tablet Take 1 tablet (500 mg total) by mouth 2 (two) times daily. 180 tablet 3  . sildenafil (VIAGRA) 100 MG tablet Take 1 tablet (100 mg total) by mouth as needed for erectile dysfunction. 10 tablet 6  . tiZANidine (ZANAFLEX) 4 MG tablet Take 0.5-1 tablets (2-4 mg total) by mouth every 6 (six) hours as needed for muscle spasms. 30 tablet 0  . warfarin (COUMADIN) 5 MG tablet Take as directed by Coumadin Clinic (Patient taking differently: Take 5-7.5 mg by mouth See admin instructions. Take 7.5 mg by mouth daily on Friday. Take 5 mg by mouth daily on all other days) 120 tablet 1  . Multiple Vitamins-Minerals (MULTIVITAMIN PO) Take 1 tablet by mouth daily.     No facility-administered medications prior to visit.     ROS Review of Systems  Constitutional: Negative for appetite change, diaphoresis, fatigue and unexpected weight change.  HENT: Negative.   Eyes: Negative for visual disturbance.  Respiratory: Negative for cough, chest tightness, shortness of breath and wheezing.   Cardiovascular: Negative for chest pain, palpitations and leg swelling.  Gastrointestinal: Negative for abdominal pain, constipation, diarrhea, nausea and vomiting.  Genitourinary: Negative.  Negative for difficulty urinating, scrotal swelling, testicular pain and urgency.  Musculoskeletal: Negative.  Negative for arthralgias and myalgias.  Skin: Negative for color change and pallor.  Allergic/Immunologic: Negative.   Neurological: Negative.  Negative for dizziness, weakness and light-headedness.  Hematological: Negative for adenopathy. Does not bruise/bleed easily.    Psychiatric/Behavioral: Negative.     Objective:  BP 110/60 (BP Location: Left Arm, Patient Position: Sitting, Cuff Size: Normal)   Pulse 65   Temp 98.1 F (36.7 C) (Oral)   Resp 16   Ht 5\' 8"  (1.727 m)   Wt 198 lb 12 oz (90.2 kg)   SpO2 96%   BMI 30.22 kg/m   BP Readings from Last 3 Encounters:  05/22/17 110/60  05/02/17 132/74  04/28/17 123/83    Wt Readings from Last 3 Encounters:  05/22/17 198 lb 12 oz (90.2 kg)  05/02/17 196 lb (88.9 kg)  04/28/17 197 lb 12.8 oz (89.7 kg)    Physical Exam  Constitutional: He is oriented to person, place, and time.  No distress.  HENT:  Mouth/Throat: Oropharynx is clear and moist. No oropharyngeal exudate.  Eyes: Conjunctivae are normal. Left eye exhibits no discharge. No scleral icterus.  Neck: Normal range of motion. Neck supple. No JVD present. No thyromegaly present.  Cardiovascular: Normal rate and normal heart sounds. Exam reveals no gallop.  No murmur heard. Pulmonary/Chest: Effort normal and breath sounds normal. No stridor. No respiratory distress. He has no wheezes. He has no rales.  Abdominal: Soft. Bowel sounds are normal. He exhibits no distension and no mass. There is no tenderness. There is no guarding.  Musculoskeletal: Normal range of motion. He exhibits no edema, tenderness or deformity.  Lymphadenopathy:    He has no cervical adenopathy.  Neurological: He is alert and oriented to person, place, and time.  Skin: Skin is warm and dry. No rash noted. He is not diaphoretic. No erythema. No pallor.  Vitals reviewed.   Lab Results  Component Value Date   WBC 8.8 04/28/2017   HGB 13.5 04/28/2017   HCT 39.4 04/28/2017   PLT 371 04/28/2017   GLUCOSE 111 (H) 04/28/2017   CHOL 110 04/28/2017   TRIG 97 04/28/2017   HDL 32 (L) 04/28/2017   LDLCALC 59 04/28/2017   ALT 45 02/07/2014   AST 35 02/07/2014   NA 140 04/28/2017   K 4.2 04/28/2017   CL 108 04/28/2017   CREATININE 1.01 04/28/2017   BUN 15 04/28/2017    CO2 20 (L) 04/28/2017   TSH 2.82 05/22/2017   PSA 6.06 02/21/2017   INR 2.6 05/22/2017   HGBA1C 5.9 05/22/2017    No results found.  Assessment & Plan:   Brance was seen today for atrial fibrillation, annual exam and hypertension.  Diagnoses and all orders for this visit:  Need for pneumococcal vaccination -     Pneumococcal polysaccharide vaccine 23-valent greater than or equal to 2yo subcutaneous/IM  Elevated PSA -     Cancel: PSA, total and free; Future  Benign prostatic hyperplasia with urinary obstruction -     Cancel: PSA, total and free; Future  Prediabetes- His A1c is down to 5.9%.  This is lower than it was last time.  He was praised for his lifestyle modifications.  Medical therapy is not indicated. -     Hemoglobin A1c; Future  Essential hypertension- His blood pressure is adequately well controlled.  Recent electrolytes and renal function are normal. -     Thyroid Panel With TSH; Future  Chronic a-fib ([place])- He has good rate and rhythm control.  His TFTs are normal. -     Thyroid Panel With TSH; Future  Routine health maintenance   I have discontinued Jarvis Sawa. Buehring's Multiple Vitamins-Minerals (MULTIVITAMIN PO). I am also having him maintain his fish oil-omega-3 fatty acids, Probiotic Product (PROBIOTIC FORMULA PO), loratadine, Cyanocobalamin (VITAMIN B 12 PO), clobetasol cream, pregabalin, sildenafil, hydrocortisone, tiZANidine, atorvastatin, metoprolol succinate, dronedarone, ranolazine, warfarin, Coenzyme Q10 (CO Q 10 PO), glucosamine-chondroitin, docusate sodium, and losartan.  No orders of the defined types were placed in this encounter.  See AVS for instructions about healthy living and anticipatory guidance.  Follow-up: Return in about 6 months (around 11/22/2017).  Scarlette Calico, MD

## 2017-05-22 NOTE — Patient Instructions (Signed)

## 2017-05-22 NOTE — Patient Instructions (Signed)
Description   Continue same dose of coumadin  1 tablet daily except 1.5 tablets on Mondays and Fridays. . Pending Tikosyn admission. Check INR weekly

## 2017-05-23 ENCOUNTER — Encounter: Payer: Self-pay | Admitting: Internal Medicine

## 2017-05-23 NOTE — Assessment & Plan Note (Signed)

## 2017-05-29 ENCOUNTER — Ambulatory Visit (INDEPENDENT_AMBULATORY_CARE_PROVIDER_SITE_OTHER): Payer: Medicare Other | Admitting: *Deleted

## 2017-05-29 DIAGNOSIS — I4892 Unspecified atrial flutter: Secondary | ICD-10-CM

## 2017-05-29 DIAGNOSIS — Z5181 Encounter for therapeutic drug level monitoring: Secondary | ICD-10-CM

## 2017-05-29 LAB — POCT INR: INR: 3.3

## 2017-05-29 NOTE — Patient Instructions (Signed)
Description   Today take 1 tablet then continue same dose of coumadin  1 tablet daily except 1.5 tablets on Mondays and Fridays. Pending Tikosyn admission. Check INR weekly.

## 2017-06-06 ENCOUNTER — Encounter (HOSPITAL_COMMUNITY): Payer: Self-pay | Admitting: Nurse Practitioner

## 2017-06-06 ENCOUNTER — Other Ambulatory Visit: Payer: Self-pay

## 2017-06-06 ENCOUNTER — Inpatient Hospital Stay (HOSPITAL_COMMUNITY)
Admission: AD | Admit: 2017-06-06 | Discharge: 2017-06-09 | DRG: 310 | Disposition: A | Discharge status: Home / Self Care | Payer: Medicare Other | Source: Ambulatory Visit | Attending: Internal Medicine | Admitting: Internal Medicine

## 2017-06-06 ENCOUNTER — Ambulatory Visit (HOSPITAL_COMMUNITY)
Admission: RE | Admit: 2017-06-06 | Discharge: 2017-06-06 | Disposition: A | Discharge status: Home / Self Care | Payer: Medicare Other | Source: Ambulatory Visit | Attending: Nurse Practitioner | Admitting: Nurse Practitioner

## 2017-06-06 ENCOUNTER — Ambulatory Visit (INDEPENDENT_AMBULATORY_CARE_PROVIDER_SITE_OTHER): Payer: Medicare Other | Admitting: *Deleted

## 2017-06-06 VITALS — BP 140/88 | HR 83 | Ht 68.0 in | Wt 201.4 lb

## 2017-06-06 DIAGNOSIS — I712 Thoracic aortic aneurysm, without rupture: Secondary | ICD-10-CM | POA: Diagnosis present

## 2017-06-06 DIAGNOSIS — Z7901 Long term (current) use of anticoagulants: Secondary | ICD-10-CM | POA: Diagnosis not present

## 2017-06-06 DIAGNOSIS — Z886 Allergy status to analgesic agent status: Secondary | ICD-10-CM | POA: Diagnosis not present

## 2017-06-06 DIAGNOSIS — E785 Hyperlipidemia, unspecified: Secondary | ICD-10-CM | POA: Diagnosis present

## 2017-06-06 DIAGNOSIS — I428 Other cardiomyopathies: Secondary | ICD-10-CM

## 2017-06-06 DIAGNOSIS — I480 Paroxysmal atrial fibrillation: Secondary | ICD-10-CM | POA: Diagnosis present

## 2017-06-06 DIAGNOSIS — I481 Persistent atrial fibrillation: Secondary | ICD-10-CM | POA: Diagnosis not present

## 2017-06-06 DIAGNOSIS — Z5181 Encounter for therapeutic drug level monitoring: Secondary | ICD-10-CM

## 2017-06-06 DIAGNOSIS — I484 Atypical atrial flutter: Secondary | ICD-10-CM | POA: Diagnosis not present

## 2017-06-06 DIAGNOSIS — I4892 Unspecified atrial flutter: Secondary | ICD-10-CM | POA: Diagnosis not present

## 2017-06-06 DIAGNOSIS — I10 Essential (primary) hypertension: Secondary | ICD-10-CM | POA: Diagnosis not present

## 2017-06-06 DIAGNOSIS — I739 Peripheral vascular disease, unspecified: Secondary | ICD-10-CM | POA: Diagnosis not present

## 2017-06-06 DIAGNOSIS — I4891 Unspecified atrial fibrillation: Secondary | ICD-10-CM | POA: Diagnosis not present

## 2017-06-06 DIAGNOSIS — I482 Chronic atrial fibrillation, unspecified: Secondary | ICD-10-CM

## 2017-06-06 DIAGNOSIS — Z79899 Other long term (current) drug therapy: Secondary | ICD-10-CM | POA: Diagnosis not present

## 2017-06-06 DIAGNOSIS — Z953 Presence of xenogenic heart valve: Secondary | ICD-10-CM | POA: Diagnosis not present

## 2017-06-06 DIAGNOSIS — I38 Endocarditis, valve unspecified: Secondary | ICD-10-CM | POA: Diagnosis not present

## 2017-06-06 DIAGNOSIS — Z8673 Personal history of transient ischemic attack (TIA), and cerebral infarction without residual deficits: Secondary | ICD-10-CM | POA: Diagnosis not present

## 2017-06-06 LAB — BASIC METABOLIC PANEL
ANION GAP: 8 (ref 5–15)
BUN: 18 mg/dL (ref 6–20)
CO2: 23 mmol/L (ref 22–32)
Calcium: 8.9 mg/dL (ref 8.9–10.3)
Chloride: 107 mmol/L (ref 101–111)
Creatinine, Ser: 0.99 mg/dL (ref 0.61–1.24)
Glucose, Bld: 128 mg/dL — ABNORMAL HIGH (ref 65–99)
Potassium: 4.3 mmol/L (ref 3.5–5.1)
SODIUM: 138 mmol/L (ref 135–145)

## 2017-06-06 LAB — MAGNESIUM: MAGNESIUM: 2.2 mg/dL (ref 1.7–2.4)

## 2017-06-06 LAB — POCT INR: INR: 2.4

## 2017-06-06 MED ORDER — DOFETILIDE 500 MCG PO CAPS
500.0000 ug | ORAL_CAPSULE | Freq: Two times a day (BID) | ORAL | Status: DC
  Administered 2017-06-06 – 2017-06-09 (×6): 500 ug via ORAL
  Filled 2017-06-06 (×6): qty 1

## 2017-06-06 MED ORDER — DOCUSATE SODIUM 100 MG PO CAPS
100.0000 mg | ORAL_CAPSULE | Freq: Every day | ORAL | Status: DC
Start: 2017-06-06 — End: 2017-06-09
  Administered 2017-06-07 – 2017-06-09 (×3): 100 mg via ORAL
  Filled 2017-06-06 (×3): qty 1

## 2017-06-06 MED ORDER — OMEGA-3-ACID ETHYL ESTERS 1 G PO CAPS
1.0000 g | ORAL_CAPSULE | Freq: Two times a day (BID) | ORAL | Status: DC
  Administered 2017-06-06 – 2017-06-09 (×6): 1 g via ORAL
  Filled 2017-06-06 (×6): qty 1

## 2017-06-06 MED ORDER — DOFETILIDE 500 MCG PO CAPS: 500.0000 ug | ORAL_CAPSULE | Freq: Two times a day (BID) | ORAL | Status: DC

## 2017-06-06 MED ORDER — WARFARIN - PHARMACIST DOSING INPATIENT: Freq: Every day | Status: DC

## 2017-06-06 MED ORDER — WARFARIN SODIUM 5 MG PO TABS
5.0000 mg | ORAL_TABLET | ORAL | Status: DC
  Administered 2017-06-06 – 2017-06-07 (×2): 5 mg via ORAL
  Filled 2017-06-06 (×2): qty 1

## 2017-06-06 MED ORDER — METOPROLOL SUCCINATE ER 50 MG PO TB24
50.0000 mg | ORAL_TABLET | Freq: Two times a day (BID) | ORAL | Status: DC
  Administered 2017-06-06 – 2017-06-09 (×6): 50 mg via ORAL
  Filled 2017-06-06 (×6): qty 1

## 2017-06-06 MED ORDER — OMEGA-3 FATTY ACIDS 1000 MG PO CAPS: 1.0000 g | ORAL_CAPSULE | Freq: Two times a day (BID) | ORAL | Status: DC

## 2017-06-06 MED ORDER — WARFARIN SODIUM 7.5 MG PO TABS: 7.5000 mg | ORAL_TABLET | ORAL | Status: DC

## 2017-06-06 MED ORDER — VITAMIN B-12 1000 MCG PO TABS
5000.0000 ug | ORAL_TABLET | Freq: Every day | ORAL | Status: DC
  Administered 2017-06-06 – 2017-06-08 (×2): 5000 ug via ORAL
  Filled 2017-06-06 (×2): qty 5

## 2017-06-06 MED ORDER — SODIUM CHLORIDE 0.9% FLUSH: 3.0000 mL | INTRAVENOUS | Status: DC | PRN

## 2017-06-06 MED ORDER — SODIUM CHLORIDE 0.9% FLUSH
3.0000 mL | Freq: Two times a day (BID) | INTRAVENOUS | Status: DC
  Administered 2017-06-06 – 2017-06-07 (×2): 3 mL via INTRAVENOUS

## 2017-06-06 MED ORDER — LOSARTAN POTASSIUM 25 MG PO TABS
25.0000 mg | ORAL_TABLET | Freq: Every evening | ORAL | Status: DC
  Administered 2017-06-06 – 2017-06-08 (×3): 25 mg via ORAL
  Filled 2017-06-06 (×3): qty 1

## 2017-06-06 MED ORDER — SODIUM CHLORIDE 0.9 % IV SOLN: 250.0000 mL | INTRAVENOUS | Status: DC | PRN

## 2017-06-06 MED ORDER — VITAMIN B-12 5000 MCG SL SUBL: 5000.0000 ug | SUBLINGUAL_TABLET | Freq: Every day | SUBLINGUAL | Status: DC

## 2017-06-06 MED ORDER — ATORVASTATIN CALCIUM 80 MG PO TABS
80.0000 mg | ORAL_TABLET | Freq: Every evening | ORAL | Status: DC
  Administered 2017-06-06 – 2017-06-08 (×3): 80 mg via ORAL
  Filled 2017-06-06 (×3): qty 1

## 2017-06-06 MED ORDER — CO Q-10 300 MG PO CAPS: 300.0000 mg | ORAL_CAPSULE | Freq: Every day | ORAL | Status: DC

## 2017-06-06 MED ORDER — RANOLAZINE ER 500 MG PO TB12
500.0000 mg | ORAL_TABLET | Freq: Two times a day (BID) | ORAL | Status: DC
  Administered 2017-06-06 – 2017-06-09 (×6): 500 mg via ORAL
  Filled 2017-06-06 (×6): qty 1

## 2017-06-06 NOTE — Progress Notes (Signed)
ANTICOAGULATION CONSULT NOTE - Initial Consult  Pharmacy Consult for Coumadin Indication: atrial fibrillation  Allergies  Allergen Reactions  . Naproxen Hives and Other (See Comments)    Aleve    Patient Measurements: Height: 5\' 8"  (172.7 cm) Weight: 200 lb 9.6 oz (91 kg) IBW/kg (Calculated) : 68.4  Vital Signs: Temp: 97.8 F (36.6 C) (04/02 1453) Temp Source: Oral (04/02 1453) BP: 121/89 (04/02 1453) Pulse Rate: 80 (04/02 1453)  Labs: Recent Labs    06/06/17 0855 06/06/17 1007  INR 2.4  --   CREATININE  --  0.99    Estimated Creatinine Clearance: 71.7 mL/min (by C-G formula based on SCr of 0.99 mg/dL).   Medical History: Past Medical History:  Diagnosis Date  . Acute rheumatic heart disease, unspecified    childhood, age  29 & 73  . Acute rheumatic pericarditis   . Atrial fibrillation ([place])    history  . Diverticulosis   . External hemorrhoids without mention of complication   . 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 Lesion of ulnar nerve   . 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 Other and unspecified hyperlipidemia   . Previous back surgery 1978, jan 2007  . Psychosexual dysfunction with inhibited sexual excitement   . SOB (shortness of breath)    "with heavy exercise"  . Stroke [facility])    Assessment:  77 yr old male admitted for Tikosyn initiation, on Coumadin for atrial fibrillation.    INR is therapeutic (2.4).     Home regimen: 5 mg daily, except 7.5 mg on Mondays and Fridays.    Last dose 4/1 at home.  Goal of Therapy:  INR 2-3 Monitor platelets by anticoagulation protocol: Yes   Plan:   Continue Coumadin 5 mg daily, expect 7.5 mg on Mondays and Fridays.  Coumadin 5 mg due today.  Daily PT/INR for now.  Arty Baumgartner, [place] Pager: [phone] 06/06/2017,6:02 PM

## 2017-06-06 NOTE — Discharge Instructions (Addendum)
You have an appointment set up with the [facility].  Multiple studies have shown that being followed by a dedicated atrial fibrillation clinic in addition to the standard care you receive from your other physicians improves health. We believe that enrollment in the atrial fibrillation clinic will allow [place] to better care for you.   The phone number to the [facility] is [phone]. The clinic is staffed Monday through Friday from 8:30am to 5pm.  Parking Directions: The clinic is located in the Heart and Vascular Building connected to [facility]. 1)From [address] turn on to [address] and go to the 3rd entrance  (Heart and Vascular entrance) on the right. 2)Look to the right for Heart &Vascular Parking Garage. 3)A code for the entrance is required please call the clinic to receive this.   4)Take the elevators to the 1st floor. Registration is in the room with the glass walls at the end of the hallway.  If you have any trouble parking or locating the clinic, please dont hesitate to call [phone]. Information on my medicine - Coumadin   (Warfarin)  Why was Coumadin prescribed for you? Coumadin was prescribed for you because you have a blood clot or a medical condition that can cause an increased risk of forming blood clots. Blood clots can cause serious health problems by blocking the flow of blood to the heart, lung, or brain. Coumadin can prevent harmful blood clots from forming. As a reminder your indication for Coumadin is:   Stroke Prevention Because Of Atrial Fibrillation  What test will check on my response to Coumadin? While on Coumadin (warfarin) you will need to have an INR test regularly to ensure that your dose is keeping you in the desired range. The INR (international normalized ratio) number is calculated from the result of the laboratory test called prothrombin time (PT).  If an INR APPOINTMENT HAS NOT ALREADY BEEN MADE FOR YOU  please schedule an appointment to have this lab work done by your health care provider within 7 days. Your INR goal is usually a number between:  2 to 3 or your provider may give you a more narrow range like 2-2.5.  Ask your health care provider during an office visit what your goal INR is.  What  do you need to  know  About  COUMADIN? Take Coumadin (warfarin) exactly as prescribed by your healthcare provider about the same time each day.  DO NOT stop taking without talking to the doctor who prescribed the medication.  Stopping without other blood clot prevention medication to take the place of Coumadin may increase your risk of developing a new clot or stroke.  Get refills before you run out.  What do you do if you miss a dose? If you miss a dose, take it as soon as you remember on the same day then continue your regularly scheduled regimen the next day.  Do not take two doses of Coumadin at the same time.  Important Safety Information A possible side effect of Coumadin (Warfarin) is an increased risk of bleeding. You should call your healthcare provider right away if you experience any of the following: ? Bleeding from an injury or your nose that does not stop. ? Unusual colored urine (red or dark brown) or unusual colored stools (red or black). ? Unusual bruising for unknown reasons. ? A serious fall or if you hit your head (even if there is no bleeding).  Some foods or medicines interact  with Coumadin (warfarin) and might alter your response to warfarin. To help avoid this: ? Eat a balanced diet, maintaining a consistent amount of Vitamin K. ? Notify your provider about major diet changes you plan to make. ? Avoid alcohol or limit your intake to 1 drink for women and 2 drinks for men per day. (1 drink is 5 oz. wine, 12 oz. beer, or 1.5 oz. liquor.)  Make sure that ANY health care provider who prescribes medication for you knows that you are taking Coumadin (warfarin).  Also make sure the  healthcare provider who is monitoring your Coumadin knows when you have started a new medication including herbals and non-prescription products.  Coumadin (Warfarin)  Major Drug Interactions  Increased Warfarin Effect Decreased Warfarin Effect  Alcohol (large quantities) Antibiotics (esp. Septra/Bactrim, Flagyl, Cipro) Amiodarone (Cordarone) Aspirin (ASA) Cimetidine (Tagamet) Megestrol (Megace) NSAIDs (ibuprofen, naproxen, etc.) Piroxicam (Feldene) Propafenone (Rythmol SR) Propranolol (Inderal) Isoniazid (INH) Posaconazole (Noxafil) Barbiturates (Phenobarbital) Carbamazepine (Tegretol) Chlordiazepoxide (Librium) Cholestyramine (Questran) Griseofulvin Oral Contraceptives Rifampin Sucralfate (Carafate) Vitamin K   Coumadin (Warfarin) Major Herbal Interactions  Increased Warfarin Effect Decreased Warfarin Effect  Garlic Ginseng Ginkgo biloba Coenzyme Q10 Green tea St. Johns wort    Coumadin (Warfarin) FOOD Interactions  Eat a consistent number of servings per week of foods HIGH in Vitamin K (1 serving =  cup)  Collards (cooked, or boiled & drained) Kale (cooked, or boiled & drained) Mustard greens (cooked, or boiled & drained) Parsley *serving size only =  cup Spinach (cooked, or boiled & drained) Swiss chard (cooked, or boiled & drained) Turnip greens (cooked, or boiled & drained)  Eat a consistent number of servings per week of foods MEDIUM-HIGH in Vitamin K (1 serving = 1 cup)  Asparagus (cooked, or boiled & drained) Broccoli (cooked, boiled & drained, or raw & chopped) Brussel sprouts (cooked, or boiled & drained) *serving size only =  cup Lettuce, raw (green leaf, endive, romaine) Spinach, raw Turnip greens, raw & chopped   These websites have more information on Coumadin (warfarin):  FailFactory.se; VeganReport.com.au;  Dofetilide capsules What is this medicine? DOFETILIDE (doe FET il ide) is an antiarrhythmic drug. It helps make  your heart beat regularly. This medicine also helps to slow rapid heartbeats. This medicine may be used for other purposes; ask your health care provider or pharmacist if you have questions. COMMON BRAND NAME(S): Tikosyn What should I tell my health care provider before I take this medicine? They need to know if you have any of these conditions: -heart disease -history of low levels of potassium or magnesium -kidney disease -liver disease -an unusual or allergic reaction to dofetilide, other medicines, foods, dyes, or preservatives -pregnant or trying to get pregnant -breast-feeding How should I use this medicine? Take this medicine by mouth with a glass of water. Follow the directions on the prescription label. You can take this medicine with or without food. Do not drink grapefruit juice with this medicine. Take your doses at regular intervals. Do not take your medicine more often than directed. Do not stop taking this medicine suddenly. This may cause serious, heart-related side effects. Your doctor will tell you how much medicine to take. If your doctor wants you to stop the medicine, the dose will be slowly lowered over time to avoid any side effects. Talk to your pediatrician regarding the use of this medicine in children. Special care may be needed. Overdosage: If you think you have taken too much of this medicine contact a poison control  center or emergency room at once. NOTE: This medicine is only for you. Do not share this medicine with others. What if I miss a dose? If you miss a dose, take it as soon as you can. If it is almost time for your next dose, take only that dose. Do not take double or extra doses. What may interact with this medicine? Do not take this medicine with any of the following medications: -cimetidine -dolutegravir -hydrochlorothiazide alone or in combination with other medicines -isavuconazonium -ketoconazole -megestrol -other medicines that prolong the QT  interval (cause an abnormal heart rhythm) -prochlorperazine -trimethoprim alone or in combination with sulfamethoxazole -verapamil This medicine may also interact with the following medications: -amiloride -certain antidepressants like fluvoxamine or paroxetine -certain antiviral medicines for HIV or AIDS like atazanavir or darunavir -certain medicines for fungal infections like clotrimazole or miconazole -digoxin -diltiazem -dronabinol, THC -grapefruit juice -metformin -nefazodone -triamterene -zafirlukast This list may not describe all possible interactions. Give your health care provider a list of all the medicines, herbs, non-prescription drugs, or dietary supplements you use. Also tell them if you smoke, drink alcohol, or use illegal drugs. Some items may interact with your medicine. What should I watch for while using this medicine? Visit your doctor or health care professional for regular checks on your progress. Wear a medical ID bracelet or chain, and carry a card that describes your disease and details of your medicine and dosage times. Check your heart rate and blood pressure regularly while you are taking this medicine. Ask your doctor or health care professional what your heart rate and blood pressure should be, and when you should contact him or her. Your doctor or health care professional also may schedule regular tests to check your progress. You will be started on this medicine in a specialized facility for at least three days. You will be monitored to find the right dose of medicine for you. It is very important that you take your medicine exactly as prescribed when you leave the hospital. The correct dosing of this medicine is very important to treat your condition and prevent possible serious side effects. What side effects may I notice from receiving this medicine? Side effects that you should report to your doctor or health care professional as soon as  possible: -allergic reactions like skin rash, itching or hives, swelling of the face, lips, or tongue -breathing problems -dizziness -fast or rapid beating of the heart -feeling faint or lightheaded -swelling of the ankles -unusually weak or tired -vomiting Side effects that usually do not require medical attention (report to your doctor or health care professional if they continue or are bothersome): -cough -diarrhea -difficulty sleeping -headache -nausea -stomach pain This list may not describe all possible side effects. Call your doctor for medical advice about side effects. You may report side effects to FDA at 1-800-FDA-1088. Where should I keep my medicine? Keep out of the reach of children. Store at room temperature between 15 and 30 degrees C (59 and 86 degrees F). Protect the medicine from moisture or humidity. Keep container tightly closed. Throw away any unused medicine after the expiration date. NOTE: This sheet is a summary. It may not cover all possible information. If you have questions about this medicine, talk to your doctor, pharmacist, or health care provider.  2018 Elsevier/Gold Standard (2016-01-04 16:10:96)

## 2017-06-06 NOTE — Patient Instructions (Signed)
Description   Continue same dose of coumadin  1 tablet daily except 1.5 tablets on Mondays and Fridays. Pending Tikosyn admission. Today  Check INR weekly.

## 2017-06-06 NOTE — Care Management Note (Addendum)
Case Management Note  Patient Details  Name: [name] [name] MRN: [id number] Date of Birth: Jan 30, 1941  Subjective/Objective:  Pt presented for Tikosyn Load: Benefits check in process and CM will make the patient aware of cost once completed.                   Action/Plan: CM will assist with Rx for 7 day supply no refills and pt will need Original Rx with Refills. No further needs at this time.    Expected Discharge Date:                  Expected Discharge Plan:  Home/Self Care  In-House Referral:  NA  Discharge planning Services  CM Consult, Medication Assistance  Post Acute Care Choice:  NA Choice offered to:  NA  DME Arranged:  N/A DME Agency:  NA  HH Arranged:    [place] Agency:  NA  Status of Service:  Completed, signed off  If discussed at [place] of Stay Meetings, dates discussed:    Additional Comments: 1107 06-07-17 Jacqlyn Krauss, RN,BSN [phone] CM did speak with pt in regards to disposition needs. CM called [facility] and Mail order Rx for 90 day supply will be $413.00. Pt is willing to pay this amount. Rx for 90 day supply to be e-scribed to [facility]. CM will assist with 14 day supply Rx. No further needs from CM at this time.    1238 06-07-17 Jacqlyn Krauss, [place] [phone] S/W [name] @ [address] [place] [address] RX [facility] PDP # [phone] OPT- 2    1. TIKOSYN  500 MCG BID  COVER- NOT COVER AT ALL  PRIOR APPROVAL- NO EXCEPTION    2,DOFETILIDE  500 MCG BID  COVER- YES  CO-PAY- $ 134.85  PRIOR APPROVAL- NO   PREFERRED PHARMACY ; [facility] Bethena Roys, RN 06/06/2017, 4:03 PM

## 2017-06-06 NOTE — Progress Notes (Signed)
Primary Care Physician: [name], MD Referring Physician: Dr. Lynn Ito [name] [name] is a 77 y.o. 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     He was seen in followup at [facility] in 2/16.  Ranolazine was stopped.  Echo in 2/16 showed EF 55% with stable bioprosthetic mitral and aortic valves.  CTA chest showed stable 5 cm ascending aortic aneurysm.  He went back into atypical atrial flutter in 4/16.  Ranolazine was restarted and he was cardioverted back to NSR.    A couple of weeks ago, he went into atrial flutter again during a respiratory illness.  On 04/24/17, he was cardioverted back to NSR.  He thinks that he went into atrial flutter again later on that day. He was in atrial flutter on f/u with Dr. [name] 2/22, who referred to afib clinic for discussion of tikosyn use.[name]  He presented to afib clinic 05/02/17, to discuss admit for tikosyn. He is in SR today. He continues on multaq and ranolazine. He is on warfarin and was subtherapeutic yesterday at 1.9 and today at 1.7 Coumadin clinic is adjusting coumadin. Pt was in favor of pursing Tikosyn after 4 therapeutic INR's are obtained..  F/u in afib clinic, 4/2 for tikosyn admit. He has had 5 therapeutic INR's. He has been off multaq since last Friday. No benadryl use. He is in atrial flutter today, rate controlled. Aware of cost of drug..  Today, he denies symptoms of  chest pain, shortness of breath, orthopnea, PND, lower extremity edema, dizziness, presyncope, syncope, or neurologic sequela. The patient is tolerating medications without difficulties and is otherwise without complaint today.   Past Medical History:  Diagnosis Date  . Acute rheumatic heart disease, unspecified    childhood, age  37 & 89  . Acute rheumatic pericarditis   . Atrial fibrillation ([place])    history  .  Diverticulosis   . External hemorrhoids without mention of complication   . 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 Lesion of ulnar nerve   . 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 Other and unspecified hyperlipidemia   . Previous back surgery 1978, jan 2007  . Psychosexual dysfunction with inhibited sexual excitement   . SOB (shortness of breath)    "with heavy exercise"  . Stroke [facility])    Past Surgical History:  Procedure Laterality Date  . AORTIC AND MITRAL VALVE REPLACEMENT     09/2004  . CARDIOVERSION     3 times from 2004-2006  . CARDIOVERSION N/A 09/26/2013   Procedure: CARDIOVERSION;  Surgeon: Larey Dresser, MD;  Location: [place];  Service: Cardiovascular;  Laterality: N/A;  . CARDIOVERSION N/A 06/19/2014   Procedure: CARDIOVERSION;  Surgeon: Jerline Pain, MD;  Location[facility] ENDOSCOPY;  Service: Cardiovascular;  Laterality: N/A;  . CARDIOVERSION N/A 04/24/2017   Procedure: CARDIOVERSION;  Surgeon: Larey Dresser, MD;  Location: [facility] ENDOSCOPY;  Service: Cardiovascular;  Laterality: N/A;  . COLONOSCOPY    . laminectomies     10/1975 and in 03/2005  . TONSILLECTOMY     1950    Current Outpatient Medications  Medication Sig Dispense Refill  . atorvastatin (LIPITOR) 80 MG tablet Take 1 tablet by mouth  daily (Patient taking differently: Take 80 mg by mouth every evening. )  90 tablet 3  . clobetasol cream (TEMOVATE) 1.27 % Apply 1 application topically daily as needed (skin irritation).     . Coenzyme Q10 (CO Q-10) 300 MG CAPS Take 300 mg by mouth daily.    . Cyanocobalamin (VITAMIN B-12) 5000 MCG SUBL Place 5,000 mcg under the tongue daily.    [name] docusate sodium (COLACE) 100 MG capsule Take 100 mg by mouth daily.    . fish oil-omega-3 fatty acids 1000 MG capsule Take 1 g by mouth 2 (two) times daily.     . Glucosamine-Chondroitin (COSAMIN DS PO) Take 1 tablet by mouth 2 (two) times daily.    . hydrocortisone (ANUSOL-HC) 2.5 % rectal cream Place 1  application rectally 2 (two) times daily. (Patient taking differently: Place 1 application rectally 2 (two) times daily as needed for hemorrhoids or anal itching. ) 30 g 0  . loratadine (CLARITIN) 10 MG tablet Take 10 mg by mouth daily as needed for allergies.     [name] losartan (COZAAR) 25 MG tablet Take 1 tablet (25 mg total) by mouth daily. (Patient taking differently: Take 25 mg by mouth every evening. ) 90 tablet 3  . metoprolol succinate (TOPROL-XL) 50 MG 24 hr tablet TAKE 1 TABLET BY MOUTH 2  TIMES DAILY AT 8 AM AND 10  PM. (Patient taking differently: Take 50 mg by mouth 2 (two) times daily. ) 180 tablet 3  . Multiple Vitamins-Minerals (MULTIVITAL-M) TABS Take by mouth.    . Probiotic Product (PROBIOTIC FORMULA PO) Take 1 capsule by mouth 2 (two) times daily.     . ranolazine (RANEXA) 500 MG 12 hr tablet Take 1 tablet (500 mg total) by mouth 2 (two) times daily. 180 tablet 3  . sildenafil (VIAGRA) 100 MG tablet Take 1 tablet (100 mg total) by mouth as needed for erectile dysfunction. 10 tablet 6  . warfarin (COUMADIN) 5 MG tablet Take as directed by Coumadin Clinic (Patient taking differently: Take 5-7.5 mg by mouth See admin instructions. Take 7.5 mg by mouth daily on Monday & Friday. Take 5 mg by mouth daily on all other days) 120 tablet 1   No current facility-administered medications for this encounter.     Allergies  Allergen Reactions  . Naproxen Hives and Other (See Comments)    Aleve    Social History   Socioeconomic History  . Marital status: Married    Spouse name: Not on file  . Number of children: 2  . Years of education: BS  . Highest education level: Not on file  Occupational History  . Occupation: retired  Scientific laboratory technician  . Financial resource strain: Not on file  . Food insecurity:    Worry: Not on file    Inability: Not on file  . Transportation needs:    Medical: Not on file    Non-medical: Not on file  Tobacco Use  . Smoking status: Never Smoker  . Smokeless  tobacco: Never Used  Substance and Sexual Activity  . Alcohol use: Yes    Alcohol/week: 1.8 oz    Types: 1 Glasses of wine, 1 Cans of beer, 1 Shots of liquor per week    Comment: wine occasionally  . Drug use: No  . Sexual activity: Yes    Partners: Female  Lifestyle  . Physical activity:    Days per week: Not on file    Minutes per session: Not on file  . Stress: Not on file  Relationships  . Social connections:  Talks on phone: Not on file    Gets together: Not on file    Attends religious service: Not on file    Active member of club or organization: Not on file    Attends meetings of clubs or organizations: Not on file    Relationship status: Not on file  . Intimate partner violence:    Fear of current or ex partner: Not on file    Emotionally abused: Not on file    Physically abused: Not on file    Forced sexual activity: Not on file  Other Topics Concern  . Not on file  Social History Narrative   [name] - BS, [place]. married - '67. 2 sons - '74, '77  one son is a Nurse, learning disability for [place]; 4 grandchildren. 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 marriage in good health. End-of-life: discussed issues and provided packet with the MOST form and out of facility order.    Family History  Problem Relation Age of Onset  . Macular degeneration Mother        macular degeneration  . Cancer Father        intestinal/GI  . Diabetes Paternal Aunt   . Heart attack Maternal Grandfather   . Diabetes Brother     ROS- All systems are reviewed and negative except as per the HPI above  Physical Exam: Vitals:   06/06/17 1009  BP: 140/88  Pulse: 83  Weight: 201 lb 6.4 oz (91.4 kg)  Height: 5\' 8"  (1.727 m)   Wt Readings from Last 3 Encounters:  06/06/17 201 lb 6.4 oz (91.4 kg)  05/22/17 198 lb 12 oz (90.2 kg)  05/02/17 196 lb (88.9 kg)    Labs: Lab Results  Component Value Date   NA 140  04/28/2017   K 4.2 04/28/2017   CL 108 04/28/2017   CO2 20 (L) 04/28/2017   GLUCOSE 111 (H) 04/28/2017   BUN 15 04/28/2017   CREATININE 1.01 04/28/2017   CALCIUM 8.8 (L) 04/28/2017   MG 2.0 04/28/2017   Lab Results  Component Value Date   INR 2.4 06/06/2017   Lab Results  Component Value Date   CHOL 110 04/28/2017   HDL 32 (L) 04/28/2017   LDLCALC 59 04/28/2017   TRIG 97 04/28/2017     GEN- The patient is well appearing, alert and oriented x 3 today.   Head- normocephalic, atraumatic Eyes-  Sclera clear, conjunctiva pink Ears- hearing intact Oropharynx- clear Neck- supple, no JVP Lymph- no cervical lymphadenopathy Lungs- Clear to ausculation bilaterally, normal work of breathing Heart- irregular rate and rhythm, no murmurs, rubs or gallops, PMI not laterally displaced GI- soft, NT, ND, + BS Extremities- no clubbing, cyanosis, or edema MS- no significant deformity or atrophy Skin- no rash or lesion Psych- euthymic mood, full affect Neuro- strength and sensation are intact  EKG- atrial flutter at 83 bpm, qrs int 100 ms, qtc 430 ms Epic records reviewed Echo-Left ventricle: The cavity size was normal. Wall thickness was   increased in a pattern of moderate LVH. Systolic function was   normal. The estimated ejection fraction was in the range of 60%   to 65%. Wall motion was normal; there were no regional wall   motion abnormalities. Indeterminant diastolic function. - Aortic valve: Bioprosthetic aortic valve. No significant   stenosis. There was trivial regurgitation. Mean gradient (S): 6   mm Hg. - Aorta: Moderately dilated aortic  root. Aortic root dimension: 45   mm (ED). - Mitral valve: Bioprosthetic mitral valve. No significant   stenosis. There was no significant regurgitation. Mean gradient   (D): 6 mm Hg. Valve area by pressure half-time: 3.01 cm^2. - Left atrium: The atrium was moderately dilated. - Right ventricle: The cavity size was normal. Systolic  function   was normal. - Pulmonary arteries: PA peak pressure: 23 mm Hg (S). - Inferior vena cava: The vessel was normal in size. The   respirophasic diameter changes were in the normal range (= 50%),   consistent with normal central venous pressure.  Impressions:  - Normal LV size with moderate LV hypertrophy. EF 60-65%. Normal RV   size and systolic function. Bioprosthetic aortic valve with   normal function. Bioprosthetic mitral valve with normal function.   Moderately dilated aortic root.    Assessment and Plan: 1. Paroxysmal afib/flutter In for Tikosyn admit, failed most recently multaq, off since last Friday Discussed admission for tikosyn, risk vrs benefit of drug No benadryl use He is aware of cost of drug He has had 5 therapeutic INR levels since last seen qtc looks acceptable, today in atrial  flutter at 430 ms Drugs screened by PharmD, expressed caution with ranolazine, not contraindicated but can potentially increase concentrations of tikosyn Bmet/mag today show K+/Mag at good levels for Tikosyn admit, cr cl cal at 84.42 To [facility]. [place] [address] [place], [place] [address] [phone]

## 2017-06-06 NOTE — H&P (Addendum)
Cardiology Admission History and Physical:   Patient ID: [name] [name]; MRN: [id number]; DOB: 11-10-1940   Admission date: 06/06/2017  Primary Care Provider: [name], MD Primary Cardiologist: Dr. Aundra [name]   Chief Complaint:  Tikosyn initiation   Patient Profile:   [name] [name] is a 77 y.o. male with a history of Rheumatic heart disease,  bioprosthetic AVR and MVR at the [facility] as well as MAZE procedure all in 2006, HLD, CVA, 5.0cm ascending aortic aneurysm, atrial flutter/fibrillation,2015 with recurrent atrial arrhythmia started on Multaq,  After CVA ranolazine was added and he had atrial flutter ablation at [facility] in 11/15 his ranolazine stopped,  back into atypical atrial flutter in 4/16 and ranolazine restarted, returned to [place].  Had recurrent atrial arrhythmia, 04/24/17, he was cardioverted back to [facility].  Record notes he thinks that he went into atrial flutter again later on that day.  Dr. Aundra [name] referred him to the AFib clinic for arrangement of Tikosyn initiation.   History of Present Illness:   [name] [name] presents today for Tikosyn initiation.  He was seen in the AFib clinic in Feb to plan Tikosyn initiation, he was set up for weekly INR's, his Multaq planned to stop in anticipation of Tikosyn.  Today at his AF clinic visit he confirmed off Multaq as of 06/02/17, no benadryl use, and has had 5 therapeutic weekly INR's.  Med list reviewed by [facility] RPH, Ranexa can slightly increase concentrations of Tikosyn however is not contraindicated.  Multaq planned to stop 3 days prior to initiation.  He is feeling well, no CP, palpitations, or SOB.  When in AFib he feels a decrease in his exertional capacity, fatigued.  Past Medical History:  Diagnosis Date  . Acute rheumatic heart disease, unspecified    childhood, age  88 & 55  . Acute rheumatic pericarditis   . Atrial fibrillation ([place])    history  . Diverticulosis   . External hemorrhoids without mention of  complication   . 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 Lesion of ulnar nerve   . 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 Other and unspecified hyperlipidemia   . Previous back surgery 1978, jan 2007  . Psychosexual dysfunction with inhibited sexual excitement   . SOB (shortness of breath)    "with heavy exercise"  . Stroke [facility])     Past Surgical History:  Procedure Laterality Date  . AORTIC AND MITRAL VALVE REPLACEMENT     09/2004  . CARDIOVERSION     3 times from 2004-2006  . CARDIOVERSION N/A 09/26/2013   Procedure: CARDIOVERSION;  Surgeon: Larey Dresser, MD;  Location: [place];  Service: Cardiovascular;  Laterality: N/A;  . CARDIOVERSION N/A 06/19/2014   Procedure: CARDIOVERSION;  Surgeon: Jerline Pain, MD;  Location: [facility] ENDOSCOPY;  Service: Cardiovascular;  Laterality: N/A;  . CARDIOVERSION N/A 04/24/2017   Procedure: CARDIOVERSION;  Surgeon: Larey Dresser, MD;  Location: [facility] ENDOSCOPY;  Service: Cardiovascular;  Laterality: N/A;  . COLONOSCOPY    . laminectomies     10/1975 and in 03/2005  . TONSILLECTOMY     1950     Medications Prior to Admission: Prior to Admission medications   Medication Sig Start Date End Date Taking? Authorizing Provider  atorvastatin (LIPITOR) 80 MG tablet Take 1 tablet by mouth  daily Patient taking differently: Take 80 mg by mouth every evening.  03/09/17   Larey Dresser, MD  clobetasol  cream (TEMOVATE) 8.12 % Apply 1 application topically daily as needed (skin irritation).  05/20/14   [provider]  Coenzyme Q10 (CO Q-10) 300 MG CAPS Take 300 mg by mouth daily.    [provider]  Cyanocobalamin (VITAMIN B-12) 5000 MCG SUBL Place 5,000 mcg under the tongue daily.    [provider]  docusate sodium (COLACE) 100 MG capsule Take 100 mg by mouth daily.    [provider]  fish oil-omega-3 fatty acids 1000 MG capsule Take 1 g by mouth 2 (two) times daily.     [provider]  Glucosamine-Chondroitin (COSAMIN DS PO) Take 1 tablet by mouth 2 (two) times daily.    [provider]  hydrocortisone (ANUSOL-HC) 2.5 % rectal cream Place 1 application rectally 2 (two) times daily. Patient taking differently: Place 1 application rectally 2 (two) times daily as needed for hemorrhoids or anal itching.  05/26/16   Golden Circle, FNP  loratadine (CLARITIN) 10 MG tablet Take 10 mg by mouth daily as needed for allergies.     [provider]  losartan (COZAAR) 25 MG tablet Take 1 tablet (25 mg total) by mouth daily. Patient taking differently: Take 25 mg by mouth every evening.  04/28/17   Larey Dresser, MD  metoprolol succinate (TOPROL-XL) 50 MG 24 hr tablet TAKE 1 TABLET BY MOUTH 2  TIMES DAILY AT 8 AM AND 10  PM. Patient taking differently: Take 50 mg by mouth 2 (two) times daily.  03/09/17   Larey Dresser, MD  Multiple Vitamins-Minerals (MULTIVITAL-M) TABS Take by mouth.    [provider]  Probiotic Product (PROBIOTIC FORMULA PO) Take 1 capsule by mouth 2 (two) times daily.     [provider]  ranolazine (RANEXA) 500 MG 12 hr tablet Take 1 tablet (500 mg total) by mouth 2 (two) times daily. 03/09/17   Larey Dresser, MD  sildenafil (VIAGRA) 100 MG tablet Take 1 tablet (100 mg total) by mouth as needed for erectile dysfunction. 10/27/14   [name] Lima, MD  warfarin (COUMADIN) 5 MG tablet Take as directed by Coumadin Clinic Patient taking differently: Take 5-7.5 mg by mouth See admin instructions. Take 7.5 mg by mouth daily on Monday & Friday. Take 5 mg by mouth daily on all other days 03/09/17   Larey Dresser, MD     Allergies:    Allergies  Allergen Reactions  . Naproxen Hives and Other (See Comments)    Aleve    Social History:   Social History   Socioeconomic History  . Marital status: Married    Spouse name: Not on file  . Number of children: 2  . Years of education: BS  . Highest education level: Not on file    Occupational History  . Occupation: retired  Scientific laboratory technician  . Financial resource strain: Not on file  . Food insecurity:    Worry: Not on file    Inability: Not on file  . Transportation needs:    Medical: Not on file    Non-medical: Not on file  Tobacco Use  . Smoking status: Never Smoker  . Smokeless tobacco: Never Used  Substance and Sexual Activity  . Alcohol use: Yes    Alcohol/week: 1.8 oz    Types: 1 Glasses of wine, 1 Cans of beer, 1 Shots of liquor per week    Comment: wine occasionally  . Drug use: No  . Sexual activity: Yes    Partners:  Female  Lifestyle  . Physical activity:    Days per week: Not on file    Minutes per session: Not on file  . Stress: Not on file  Relationships  . Social connections:    Talks on phone: Not on file    Gets together: Not on file    Attends religious service: Not on file    Active member of club or organization: Not on file    Attends meetings of clubs or organizations: Not on file    Relationship status: Not on file  . Intimate partner violence:    Fear of current or ex partner: Not on file    Emotionally abused: Not on file    Physically abused: Not on file    Forced sexual activity: Not on file  Other Topics Concern  . Not on file  Social History Narrative   [name] - BS, [place]. married - '67. 2 sons - '74, '77  one son is a Nurse, learning disability for [place]; 4 grandchildren. 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 marriage in good health. End-of-life: discussed issues and provided packet with the MOST form and out of facility order.    Family History:   The patient's family history includes Cancer in his father; Diabetes in his brother and paternal aunt; Heart attack in his maternal grandfather; Macular degeneration in his mother.    ROS:  Please see the history of present illness.  All other ROS reviewed and negative.     Physical Exam/Data:    Vitals:   06/06/17 1214  BP: 135/86  Pulse: 87  Temp: 98.2 F (36.8 C)  TempSrc: Oral  SpO2: 93%  Weight: 200 lb 9.6 oz (91 kg)  Height: 5\' 8"  (1.727 m)   No intake or output data in the 24 hours ending 06/06/17 1250 Filed Weights   06/06/17 1214  Weight: 200 lb 9.6 oz (91 kg)   Body mass index is 30.5 kg/m.  General:  Well nourished, well developed, in no acute distress HEENT: normal Lymph: no adenopathy Neck: no JVD Endocrine:  No thryomegaly Vascular: No carotid bruits  Cardiac:  iRRR; soft SM, no gallops or rubs Lungs:  CTA b/l, no wheezing, rhonchi or rales  Abd: soft, nontender, no hepatomegaly, obese  Ext: no edema Musculoskeletal:  No deformities Skin: warm and dry  Neuro:  No gross focal abnormalities noted Psych:  Normal affect    EKG:  The ECG that was done today was personally reviewed and demonstrates atypical AFlutter, 83bpm, QTc 46ms  Relevant CV Studies:  04/28/17: TTE Study Conclusions - Left ventricle: The cavity size was normal. Wall thickness was   increased in a pattern of moderate LVH. Systolic function was   normal. The estimated ejection fraction was in the range of 60%   to 65%. Wall motion was normal; there were no regional wall   motion abnormalities. Indeterminant diastolic function. - Aortic valve: Bioprosthetic aortic valve. No significant   stenosis. There was trivial regurgitation. Mean gradient (S): 6   mm Hg. - Aorta: Moderately dilated aortic root. Aortic root dimension: 45   mm (ED). - Mitral valve: Bioprosthetic mitral valve. No significant   stenosis. There was no significant regurgitation. Mean gradient   (D): 6 mm Hg. Valve area by pressure half-time: 3.01 cm^2. - Left atrium: The atrium was moderately dilated. - Right ventricle: The cavity size was normal. Systolic function   was  normal. - Pulmonary arteries: PA peak pressure: 23 mm Hg (S). - Inferior vena cava: The vessel was normal in size. The   respirophasic  diameter changes were in the normal range (= 50%),   consistent with normal central venous pressure. Impressions: - Normal LV size with moderate LV hypertrophy. EF 60-65%. Normal RV   size and systolic function. Bioprosthetic aortic valve with   normal function. Bioprosthetic mitral valve with normal function.   Moderately dilated aortic root.   Laboratory Data:  Chemistry Recent Labs  Lab 06/06/17 1007  NA 138  K 4.3  CL 107  CO2 23  GLUCOSE 128*  BUN 18  CREATININE 0.99  CALCIUM 8.9  GFRNONAA >60  GFRAA >60  ANIONGAP 8    No results for input(s): PROT, ALBUMIN, AST, ALT, ALKPHOS, BILITOT in the last 168 hours. HematologyNo results for input(s): WBC, RBC, HGB, HCT, MCV, MCH, MCHC, RDW, PLT in the last 168 hours. Cardiac EnzymesNo results for input(s): TROPONINI in the last 168 hours. No results for input(s): TROPIPOC in the last 168 hours.  BNPNo results for input(s): BNP, PROBNP in the last 168 hours.  DDimer No results for input(s): DDIMER in the last 168 hours.  Radiology/Studies:  No results found.  Assessment and Plan:   1. Persistent AFib     CHA2DS2Vasc is 3, on warfarin     LA measured 28mm on his last echo, this may affect ability to obtain/maintain SR     K+ 4.3     Mag 2.2     Creat 0.99 (Calc CrCl is 84)     INR 2.4     QTc is OK  2. VHD w/hx of bioprosthetic AVR/MVR     Last echo with both functioning well  3. Hx of Ascending Ao aneurysm     Follows with Dr.McLean          For questions or updates, please contact [place] Please consult [email or web address] for contact info under Cardiology/STEMI.    Signed, Baldwin Jamaica, PA-C  06/06/2017 12:50 PM    I have seen, examined the patient, and reviewed the above assessment and plan.  Changes to above are made where necessary.  On exam, iRRR.  Pt with severe LA enlargement ( 42mm by echo), prior MAZE and atypical atrial flutter.  He has had therapeutic INR for the past 4 weeks.  Labs from  today are reviewed as well as ekg.  Will initiate tikosyn 500 mcg BID and follow closely.  Co Sign: Thompson Grayer, MD 06/06/2017 2:31 PM

## 2017-06-06 NOTE — Progress Notes (Signed)
Pharmacy Review for Dofetilide (Tikosyn) Initiation  Admit Complaint: 77 y.o. male admitted 06/06/2017 with atrial fibrillation to be initiated on dofetilide.   Assessment:  Patient Exclusion Criteria: If any screening criteria checked as "Yes", then  patient  should NOT receive dofetilide until criteria item is corrected. If "Yes" please indicate correction plan.  YES  NO Patient  Exclusion Criteria Correction Plan  []  [x]  Baseline QTc interval is greater than or equal to 440 msec. IF above YES box checked dofetilide contraindicated unless patient has ICD; then may proceed if QTc 500-550 msec or with known ventricular conduction abnormalities may proceed with QTc 550-600 msec. QTc = 0.46   []  [x]  Magnesium level is less than 1.8 mEq/l : Last magnesium:  Lab Results  Component Value Date   MG 2.2 06/06/2017         []  [x]  Potassium level is less than 4 mEq/l : Last potassium:  Lab Results  Component Value Date   K 4.3 06/06/2017         []  [x]  Patient is known or suspected to have a digoxin level greater than 2 ng/ml: No results found for: DIGOXIN    []  [x]  Creatinine clearance less than 20 ml/min (calculated using Cockcroft-Gault, actual body weight and serum creatinine): Estimated Creatinine Clearance: 71.7 mL/min (by C-G formula based on SCr of 0.99 mg/dL).    []  [x]  Patient has received drugs known to prolong the QT intervals within the last 48 hours (phenothiazines, tricyclics or tetracyclic antidepressants, erythromycin, H-1 antihistamines, cisapride, fluoroquinolones, azithromycin). Drugs not listed above may have an, as yet, undetected potential to prolong the QT interval, updated information on QT prolonging agents is available at this website:QT prolonging agents Multaq stopped after 06/02/17 doses. On Ranolazine, which can potentially decrease Tikosyn clearance, but not contraindicated.  []  [x]  Patient received a dose of hydrochlorothiazide (Oretic) alone or in any  combination including triamterene (Dyazide, Maxzide) in the last 48 hours.   []  [x]  Patient received a medication known to increase dofetilide plasma concentrations prior to initial dofetilide dose:  . Trimethoprim (Primsol, Proloprim) in the last 36 hours . Verapamil (Calan, Verelan) in the last 36 hours or a sustained release dose in the last 72 hours . Megestrol (Megace) in the last 5 days  . Cimetidine (Tagamet) in the last 6 hours . Ketoconazole (Nizoral) in the last 24 hours . Itraconazole (Sporanox) in the last 48 hours  . Prochlorperazine (Compazine) in the last 36 hours  On Ranolazine, which can potentially decrease Tikosyn clearance, but not contraindicated  []  [x]  Patient is known to have a history of torsades de pointes; congenital or acquired long QT syndromes.   []  [x]  Patient has received a Class 1 antiarrhythmic with less than 2 half-lives since last dose. (Disopyramide, Quinidine, Procainamide, Lidocaine, Mexiletine, Flecainide, Propafenone)   []  [x]  Patient has received amiodarone therapy in the past 3 months or amiodarone level is greater than 0.3 ng/ml.    Patient has been appropriately anticoagulated with Coumadin.  Ordering provider was confirmed at [email or web address] if they are not listed on the [place] Prescribers list.  Goal of Therapy: Follow renal function, electrolytes, potential drug interactions, and dose adjustment. Provide education and 1 week supply at discharge.  Plan:  [x]   Physician selected initial dose within range recommended for patients level of renal function - will monitor for response.  []   Physician selected initial dose outside of range recommended for patients level of renal function - will discuss if the  dose should be altered at this time.   Select One Calculated CrCl  Dose q12h  [x]  > 60 ml/min 500 mcg  []  40-60 ml/min 250 mcg  []  20-40 ml/min 125 mcg   2. Follow up QTc after the first 5 doses, renal function, electrolytes  (K & Mg) daily x 3     days, dose adjustment, success of initiation and facilitate 1 week discharge supply as     clinically indicated.  3. Tikosyn education video. Patient to call [phone] from bedside phone. Video #116  4.  May need two-week supply at discharge; prefers to get meds via [facility] mail order.   Arty Baumgartner , [place] Pager: [phone]  5:46 PM 06/06/2017

## 2017-06-06 NOTE — Progress Notes (Signed)
PHARMACIST - PHYSICIAN ORDER COMMUNICATION  CONCERNING: P&T Medication Policy on Herbal Medications  DESCRIPTION:  This patient's order for:  Co Q-10 capshas been noted.  This product(s) is classified as an "herbal" or natural product. Due to a lack of definitive safety studies or FDA approval, nonstandard manufacturing practices, plus the potential risk of unknown drug-drug interactions while on inpatient medications, the Pharmacy and Therapeutics Committee does not permit the use of "herbal" or natural products of this type within [facility].   ACTION TAKEN: The pharmacy department is unable to verify this order at this time and your patient has been informed of this safety policy. Please reevaluate patient's clinical condition at discharge and address if the herbal or natural product(s) should be resumed at that time.

## 2017-06-07 ENCOUNTER — Encounter (HOSPITAL_COMMUNITY): Payer: Self-pay | Admitting: *Deleted

## 2017-06-07 ENCOUNTER — Other Ambulatory Visit: Payer: Self-pay

## 2017-06-07 LAB — CBC
HCT: 36.6 % — ABNORMAL LOW (ref 39.0–52.0)
Hemoglobin: 12 g/dL — ABNORMAL LOW (ref 13.0–17.0)
MCH: 31.2 pg (ref 26.0–34.0)
MCHC: 32.8 g/dL (ref 30.0–36.0)
MCV: 95.1 fL (ref 78.0–100.0)
PLATELETS: 276 10*3/uL (ref 150–400)
RBC: 3.85 MIL/uL — ABNORMAL LOW (ref 4.22–5.81)
RDW: 14.1 % (ref 11.5–15.5)
WBC: 8.9 10*3/uL (ref 4.0–10.5)

## 2017-06-07 LAB — MAGNESIUM: MAGNESIUM: 2 mg/dL (ref 1.7–2.4)

## 2017-06-07 LAB — BASIC METABOLIC PANEL
ANION GAP: 12 (ref 5–15)
BUN: 14 mg/dL (ref 6–20)
CO2: 23 mmol/L (ref 22–32)
CREATININE: 0.97 mg/dL (ref 0.61–1.24)
Calcium: 8.7 mg/dL — ABNORMAL LOW (ref 8.9–10.3)
Chloride: 103 mmol/L (ref 101–111)
Glucose, Bld: 104 mg/dL — ABNORMAL HIGH (ref 65–99)
Potassium: 4.1 mmol/L (ref 3.5–5.1)
SODIUM: 138 mmol/L (ref 135–145)

## 2017-06-07 LAB — PROTIME-INR
INR: 2.19
Prothrombin Time: 24.2 seconds — ABNORMAL HIGH (ref 11.4–15.2)

## 2017-06-07 MED ORDER — SODIUM CHLORIDE 0.9% FLUSH
3.0000 mL | INTRAVENOUS | Status: DC | PRN
Start: 2017-06-07 — End: 2017-06-09

## 2017-06-07 MED ORDER — SODIUM CHLORIDE 0.9 % IV SOLN: 250.0000 mL | INTRAVENOUS | Status: DC

## 2017-06-07 MED ORDER — SODIUM CHLORIDE 0.9% FLUSH
3.0000 mL | Freq: Two times a day (BID) | INTRAVENOUS | Status: DC
Start: 2017-06-07 — End: 2017-06-09
  Administered 2017-06-07 – 2017-06-08 (×2): 3 mL via INTRAVENOUS

## 2017-06-07 NOTE — Progress Notes (Signed)
ANTICOAGULATION CONSULT NOTE - Follow Up Consult  Pharmacy Consult for Coumadin Indication: atrial fibrillation  Allergies  Allergen Reactions  . Naproxen Hives and Other (See Comments)    Aleve    Patient Measurements: Height: 5\' 8"  (172.7 cm) Weight: 199 lb 4.7 oz (90.4 kg) IBW/kg (Calculated) : 68.4  Vital Signs: Temp: 97.9 F (36.6 C) (04/03 1432) Temp Source: Oral (04/03 1432) BP: 117/88 (04/03 1432) Pulse Rate: 75 (04/03 1432)  Labs: Recent Labs    06/06/17 0855 06/06/17 1007 06/07/17 0351  HGB  --   --  12.0*  HCT  --   --  36.6*  PLT  --   --  276  LABPROT  --   --  24.2*  INR 2.4  --  2.19  CREATININE  --  0.99 0.97    Estimated Creatinine Clearance: 73 mL/min (by C-G formula based on SCr of 0.97 mg/dL).   Medical History: Past Medical History:  Diagnosis Date  . Acute rheumatic heart disease, unspecified    childhood, age  50 & 79  . Acute rheumatic pericarditis   . Atrial fibrillation ([place])    history  . Diverticulosis   . External hemorrhoids without mention of complication   . 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 Lesion of ulnar nerve   . 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 Other and unspecified hyperlipidemia   . Previous back surgery 1978, jan 2007  . Psychosexual dysfunction with inhibited sexual excitement   . SOB (shortness of breath)    "with heavy exercise"  . Stroke [facility])    Assessment:  77 yr old male admitted for Tikosyn initiation, on Coumadin for atrial fibrillation.    INR is therapeutic (2.2).     Home regimen: 5 mg daily, except 7.5 mg on Mondays and Fridays.     Plan for DCCV 4/4 if patient dose not convert on own lytes ok  Goal of Therapy:  INR 2-3 Monitor platelets by anticoagulation protocol: Yes   Plan:   Continue home dose Coumadin 5 mg daily, expect 7.5 mg on Mondays and Fridays.  Daily PT/INR for now.  Bonnita Nasuti Pharm.D. CPP, BCPS Clinical Pharmacist [phone] 06/07/2017 2:47  PM

## 2017-06-07 NOTE — Progress Notes (Signed)
Primary Care Physician: [name], MD Referring Physician: Dr. Lynn Ito [name] [name] is a 77 y.o. 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     He was seen in followup at [facility] in 2/16.  Ranolazine was stopped.  Echo in 2/16 showed EF 55% with stable bioprosthetic mitral and aortic valves.  CTA chest showed stable 5 cm ascending aortic aneurysm.  He went back into atypical atrial flutter in 4/16.  Ranolazine was restarted and he was cardioverted back to NSR.    A couple of weeks ago, he went into atrial flutter again during a respiratory illness.  On 04/24/17, he was cardioverted back to NSR.  He thinks that he went into atrial flutter again later on that day. He was in atrial flutter on f/u with Dr. [name] 2/22, who referred to afib clinic for discussion of tikosyn use.[name]  He presented to afib clinic 05/02/17, to discuss admit for tikosyn. He is in SR today. He continues on multaq and ranolazine. He is on warfarin and was subtherapeutic yesterday at 1.9 and today at 1.7 Coumadin clinic is adjusting coumadin. Pt was in favor of pursing Tikosyn after 4 therapeutic INR's are obtained..  F/u in afib clinic, 4/2 for tikosyn admit. He has had 5 therapeutic INR's. He has been off multaq since last Friday. No benadryl use. He is in atrial flutter today, rate controlled. Aware of cost of drug..  Today, he denies symptoms of  chest pain, shortness of breath, orthopnea, PND, lower extremity edema, dizziness, presyncope, syncope, or neurologic sequela. The patient is tolerating medications without difficulties and is otherwise without complaint today.   Past Medical History:  Diagnosis Date  . Acute rheumatic heart disease, unspecified    childhood, age  89 & 46  . Acute rheumatic pericarditis   . Atrial fibrillation ([place])    history  .  Diverticulosis   . External hemorrhoids without mention of complication   . 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 Lesion of ulnar nerve   . 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 Other and unspecified hyperlipidemia   . Previous back surgery 1978, jan 2007  . Psychosexual dysfunction with inhibited sexual excitement   . SOB (shortness of breath)    "with heavy exercise"  . Stroke [facility])    Past Surgical History:  Procedure Laterality Date  . AORTIC AND MITRAL VALVE REPLACEMENT     09/2004  . CARDIOVERSION     3 times from 2004-2006  . CARDIOVERSION N/A 09/26/2013   Procedure: CARDIOVERSION;  Surgeon: Larey Dresser, MD;  Location: [place];  Service: Cardiovascular;  Laterality: N/A;  . CARDIOVERSION N/A 06/19/2014   Procedure: CARDIOVERSION;  Surgeon: Jerline Pain, MD;  Location: [facility] ENDOSCOPY;  Service: Cardiovascular;  Laterality: N/A;  . CARDIOVERSION N/A 04/24/2017   Procedure: CARDIOVERSION;  Surgeon: Larey Dresser, MD;  Location: [facility] ENDOSCOPY;  Service: Cardiovascular;  Laterality: N/A;  . COLONOSCOPY    . laminectomies     10/1975 and in 03/2005  . TONSILLECTOMY     1950    Current Facility-Administered Medications  Medication Dose Route Frequency Provider Last Rate Last Dose  . 0.9 %  sodium chloride infusion  250 mL Intravenous PRN Baldwin Jamaica, PA-C      .  0.9 %  sodium chloride infusion  250 mL Intravenous Continuous Baldwin Jamaica, PA-C      . atorvastatin (LIPITOR) tablet 80 mg  80 mg Oral QPM Baldwin Jamaica, PA-C   80 mg at 06/07/17 1748  . docusate sodium (COLACE) capsule 100 mg  100 mg Oral Daily Baldwin Jamaica, PA-C   100 mg at 06/07/17 0805  . dofetilide (TIKOSYN) capsule 500 mcg  500 mcg Oral BID Baldwin Jamaica, PA-C   500 mcg at 06/07/17 2015  . losartan (COZAAR) tablet 25 mg  25 mg Oral QPM Baldwin Jamaica, PA-C   25 mg at 06/07/17 1749  . metoprolol succinate (TOPROL-XL) 24 hr tablet 50 mg  50 mg Oral BID  Baldwin Jamaica, PA-C   50 mg at 06/07/17 0805  . omega-3 acid ethyl esters (LOVAZA) capsule 1 g  1 g Oral BID Baldwin Jamaica, PA-C   1 g at 06/07/17 0805  . ranolazine (RANEXA) 12 hr tablet 500 mg  500 mg Oral BID Baldwin Jamaica, PA-C   500 mg at 06/07/17 1950  . sodium chloride flush (NS) 0.9 % injection 3 mL  3 mL Intravenous Q12H Baldwin Jamaica, PA-C   3 mL at 06/07/17 1000  . sodium chloride flush (NS) 0.9 % injection 3 mL  3 mL Intravenous PRN Baldwin Jamaica, PA-C      . sodium chloride flush (NS) 0.9 % injection 3 mL  3 mL Intravenous Q12H Baldwin Jamaica, PA-C      . sodium chloride flush (NS) 0.9 % injection 3 mL  3 mL Intravenous PRN Baldwin Jamaica, PA-C      . vitamin B-12 (CYANOCOBALAMIN) tablet 5,000 mcg  5,000 mcg Oral Daily Baldwin Jamaica, PA-C   5,000 mcg at 06/06/17 1715  . warfarin (COUMADIN) tablet 5 mg  5 mg Oral Once per day on Sun Tue Wed Thu Sat Skeet Simmer, RPH   5 mg at 06/07/17 1749  . [START ON 06/09/2017] warfarin (COUMADIN) tablet 7.5 mg  7.5 mg Oral Once per day on Mon Fri Skeet Simmer, [facility]      . Warfarin - Pharmacist Dosing Inpatient   Does not apply q1800 Skeet Simmer, [place]        Allergies  Allergen Reactions  . Naproxen Hives and Other (See Comments)    Aleve    Social History   Socioeconomic History  . Marital status: Married    Spouse name: Not on file  . Number of children: 2  . Years of education: BS  . Highest education level: Not on file  Occupational History  . Occupation: retired  Scientific laboratory technician  . Financial resource strain: Not on file  . Food insecurity:    Worry: Not on file    Inability: Not on file  . Transportation needs:    Medical: Not on file    Non-medical: Not on file  Tobacco Use  . Smoking status: Never Smoker  . Smokeless tobacco: Never Used  Substance and Sexual Activity  . Alcohol use: Yes    Alcohol/week: 1.8 oz    Types: 1 Glasses of wine, 1 Cans of beer, 1 Shots of liquor per week     Comment: wine occasionally  . Drug use: No  . Sexual activity: Yes    Partners: Female  Lifestyle  . Physical activity:    Days per week: Not on file    Minutes per session:  Not on file  . Stress: Not on file  Relationships  . Social connections:    Talks on phone: Not on file    Gets together: Not on file    Attends religious service: Not on file    Active member of club or organization: Not on file    Attends meetings of clubs or organizations: Not on file    Relationship status: Not on file  . Intimate partner violence:    Fear of current or ex partner: Not on file    Emotionally abused: Not on file    Physically abused: Not on file    Forced sexual activity: Not on file  Other Topics Concern  . Not on file  Social History Narrative   [name] - BS, [place]. married - '67. 2 sons - '74, '77  one son is a Nurse, learning disability for [employer]; 4 grandchildren. 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 marriage in good health. End-of-life: discussed issues and provided packet with the MOST form and out of facility order.    Family History  Problem Relation Age of Onset  . Macular degeneration Mother        macular degeneration  . Cancer Father        intestinal/GI  . Diabetes Paternal Aunt   . Heart attack Maternal Grandfather   . Diabetes Brother     ROS- All systems are reviewed and negative except as per the HPI above  Physical Exam: Vitals:   06/06/17 2040 06/07/17 0500 06/07/17 1432 06/07/17 2019  BP: (!) 146/93 103/67 117/88 (!) 120/98  Pulse: 78 77 75 (!) 105  Resp: 18 18  18   Temp: (!) 97.5 F (36.4 C) 97.9 F (36.6 C) 97.9 F (36.6 C) 97.7 F (36.5 C)  TempSrc: Oral Oral Oral Oral  SpO2: 96% 98% 97% 98%  Weight:  90.4 kg (199 lb 4.7 oz)    Height:       Wt Readings from Last 3 Encounters:  06/07/17 90.4 kg (199 lb 4.7 oz)  06/06/17 91.4 kg (201 lb 6.4 oz)  05/22/17 90.2 kg (198 lb 12  oz)    Labs: Lab Results  Component Value Date   NA 138 06/07/2017   K 4.1 06/07/2017   CL 103 06/07/2017   CO2 23 06/07/2017   GLUCOSE 104 (H) 06/07/2017   BUN 14 06/07/2017   CREATININE 0.97 06/07/2017   CALCIUM 8.7 (L) 06/07/2017   MG 2.0 06/07/2017   Lab Results  Component Value Date   INR 2.19 06/07/2017   Lab Results  Component Value Date   CHOL 110 04/28/2017   HDL 32 (L) 04/28/2017   LDLCALC 59 04/28/2017   TRIG 97 04/28/2017     GEN- The patient is well appearing, alert and oriented x 3 today.   Head- normocephalic, atraumatic Eyes-  Sclera clear, conjunctiva pink Ears- hearing intact Oropharynx- clear Neck- supple, no JVP Lymph- no cervical lymphadenopathy Lungs- Clear to ausculation bilaterally, normal work of breathing Heart- irregular rate and rhythm, no murmurs, rubs or gallops, PMI not laterally displaced GI- soft, NT, ND, + BS Extremities- no clubbing, cyanosis, or edema MS- no significant deformity or atrophy Skin- no rash or lesion Psych- euthymic mood, full affect Neuro- strength and sensation are intact  EKG- atrial flutter at 83 bpm, qrs int 100 ms, qtc 430 ms Epic records reviewed Echo-Left ventricle: The cavity size was normal. Wall thickness  was   increased in a pattern of moderate LVH. Systolic function was   normal. The estimated ejection fraction was in the range of 60%   to 65%. Wall motion was normal; there were no regional wall   motion abnormalities. Indeterminant diastolic function. - Aortic valve: Bioprosthetic aortic valve. No significant   stenosis. There was trivial regurgitation. Mean gradient (S): 6   mm Hg. - Aorta: Moderately dilated aortic root. Aortic root dimension: 45   mm (ED). - Mitral valve: Bioprosthetic mitral valve. No significant   stenosis. There was no significant regurgitation. Mean gradient   (D): 6 mm Hg. Valve area by pressure half-time: 3.01 cm^2. - Left atrium: The atrium was moderately dilated. -  Right ventricle: The cavity size was normal. Systolic function   was normal. - Pulmonary arteries: PA peak pressure: 23 mm Hg (S). - Inferior vena cava: The vessel was normal in size. The   respirophasic diameter changes were in the normal range (= 50%),   consistent with normal central venous pressure.  Impressions:  - Normal LV size with moderate LV hypertrophy. EF 60-65%. Normal RV   size and systolic function. Bioprosthetic aortic valve with   normal function. Bioprosthetic mitral valve with normal function.   Moderately dilated aortic root.    Assessment and Plan: 1. Paroxysmal afib/flutter In for Tikosyn admit, failed most recently multaq, off since last Friday Discussed admission for tikosyn, risk vrs benefit of drug No benadryl use He is aware of cost of drug He has had 5 therapeutic INR levels since last seen qtc looks acceptable, today in atrial  flutter at 430 ms Drugs screened by PharmD, expressed caution with ranolazine, not contraindicated but can potentially increase concentrations of tikosyn Bmet/mag today show K+/Mag at good levels for Tikosyn admit, cr cl cal at 84.42 To [place]. [place], [facility] [address] [place], [place] [address] [phone]   I have seen, examined the patient, and reviewed the above assessment and plan.  Changes to above are made where necessary.  On exam, iRRR.  Will admit for initiation of tikosyn.  Given severe LA enlargement, I am not optimistic that he will maintain sinus rhythm long term.   Co Sign: Thompson Grayer, MD

## 2017-06-07 NOTE — Progress Notes (Addendum)
Progress Note  Patient Name: [name] [name] Date of Encounter: 06/07/2017  Primary Cardiologist: No primary care provider on file.   Subjective   Feels well, tolerating drug.  No CP, SOB  Inpatient Medications    Scheduled Meds: . atorvastatin  80 mg Oral QPM  . docusate sodium  100 mg Oral Daily  . dofetilide  500 mcg Oral BID  . losartan  25 mg Oral QPM  . metoprolol succinate  50 mg Oral BID  . omega-3 acid ethyl esters  1 g Oral BID  . ranolazine  500 mg Oral BID  . sodium chloride flush  3 mL Intravenous Q12H  . vitamin B-12  5,000 mcg Oral Daily  . warfarin  5 mg Oral Once per day on Sun Tue Wed Thu Sat  . [START ON 06/09/2017] warfarin  7.5 mg Oral Once per day on Mon Fri  . Warfarin - Pharmacist Dosing Inpatient   Does not apply q1800   Continuous Infusions: . sodium chloride     PRN Meds: sodium chloride, sodium chloride flush   Vital Signs    Vitals:   06/06/17 1214 06/06/17 1453 06/06/17 2040 06/07/17 0500  BP: 135/86 121/89 (!) 146/93 103/67  Pulse: 87 80 78 77  Resp:   18 18  Temp: 98.2 F (36.8 C) 97.8 F (36.6 C) (!) 97.5 F (36.4 C) 97.9 F (36.6 C)  TempSrc: Oral Oral Oral Oral  SpO2: 93% 98% 96% 98%  Weight: 200 lb 9.6 oz (91 kg)   199 lb 4.7 oz (90.4 kg)  Height: 5\' 8"  (1.727 m)       Intake/Output Summary (Last 24 hours) at 06/07/2017 0919 Last data filed at 06/06/2017 1816 Gross per 24 hour  Intake 240 ml  Output -  Net 240 ml   Filed Weights   06/06/17 1214 06/07/17 0500  Weight: 200 lb 9.6 oz (91 kg) 199 lb 4.7 oz (90.4 kg)    Telemetry    AFlutter CVR, - Personally Reviewed  ECG     AFib 83bpm, QTc looks OK - Personally Reviewed  Physical Exam   GEN: No acute distress, was observed ambulating in the hallway without difficulty  Neck: No JVD Cardiac: iRRR, no murmurs, rubs, or gallops.  Respiratory: CTA b/l. GI: Soft, nontender, non-distended  MS: No edema; No deformity. Neuro:  Nonfocal  Psych: Normal affect   Labs    Chemistry Recent Labs  Lab 06/06/17 1007 06/07/17 0351  NA 138 138  K 4.3 4.1  CL 107 103  CO2 23 23  GLUCOSE 128* 104*  BUN 18 14  CREATININE 0.99 0.97  CALCIUM 8.9 8.7*  GFRNONAA >60 >60  GFRAA >60 >60  ANIONGAP 8 12     Hematology Recent Labs  Lab 06/07/17 0351  WBC 8.9  RBC 3.85*  HGB 12.0*  HCT 36.6*  MCV 95.1  MCH 31.2  MCHC 32.8  RDW 14.1  PLT 276    Cardiac EnzymesNo results for input(s): TROPONINI in the last 168 hours. No results for input(s): TROPIPOC in the last 168 hours.   BNPNo results for input(s): BNP, PROBNP in the last 168 hours.   DDimer No results for input(s): DDIMER in the last 168 hours.   Radiology    No results found.  Cardiac Studies   04/28/17: TTE Study Conclusions - Left ventricle: The cavity size was normal. Wall thickness was increased in a pattern of moderate LVH. Systolic function was normal. The estimated ejection fraction  was in the range of 60% to 65%. Wall motion was normal; there were no regional wall motion abnormalities. Indeterminant diastolic function. - Aortic valve: Bioprosthetic aortic valve. No significant stenosis. There was trivial regurgitation. Mean gradient (S): 6 mm Hg. - Aorta: Moderately dilated aortic root. Aortic root dimension: 45 mm (ED). - Mitral valve: Bioprosthetic mitral valve. No significant stenosis. There was no significant regurgitation. Mean gradient (D): 6 mm Hg. Valve area by pressure half-time: 3.01 cm^2. - Left atrium: The atrium was moderately dilated. - Right ventricle: The cavity size was normal. Systolic function was normal. - Pulmonary arteries: PA peak pressure: 23 mm Hg (S). - Inferior vena cava: The vessel was normal in size. The respirophasic diameter changes were in the normal range (= 50%), consistent with normal central venous pressure. Impressions: - Normal LV size with moderate LV hypertrophy. EF 60-65%. Normal RV size and systolic  function. Bioprosthetic aortic valve with normal function. Bioprosthetic mitral valve with normal function. Moderately dilated aortic root.    Patient Profile     77 y.o. male with a history of Rheumatic heart disease,  bioprosthetic AVR and MVR at the [facility] as well as MAZE procedure all in 2006, HLD, CVA, 5.0cm ascending aortic aneurysm, atrial flutter/fibrillation,2015 with recurrent atrial arrhythmia started on Multaq,  After CVA ranolazine was added and he had atrial flutter ablation at [facility] in 11/15 his ranolazine stopped,  back into atypical atrial flutter in 4/16 and ranolazine restarted, returned to [place].  Had recurrent atrial arrhythmia, 04/24/17, he was cardioverted back to NSR. Record notes he thinks that he went into atrial flutter again later on that day, admitted for tikosyn initiation.  Assessment & Plan    1. Persistent AFib/atypical AFlutter     CHA2DS2Vasc is 3, on warfarin     LA measured 28mm on his last echo, this may affect ability to obtain/maintain SR     K+ 4.1     Mag 2.0     Creat 0.97 (Calc CrCl is 84)     INR 2.19     QTc is OK  DCCV tomorrow is not in SR, patient familiar with the procedure is agreeable   2. VHD w/hx of bioprosthetic AVR/MVR     Last echo with both functioning well  3. Hx of Ascending Ao aneurysm     Follows with Dr.McLean         For questions or updates, please contact [place] Please consult [email or web address] for contact info under Cardiology/STEMI.      Signed, Baldwin Jamaica, PA-C  06/07/2017, 9:19 AM    I have seen, examined the patient, and reviewed the above assessment and plan.  Changes to above are made where necessary.  On exam, iRRR.  Anticipate cardioversion tomorrow.  Given severe LA enlargement, I am not optimistic that he will maintain sinus rhythm.  Co Sign: Thompson Grayer, MD 06/07/2017

## 2017-06-08 ENCOUNTER — Encounter (HOSPITAL_COMMUNITY)
Admission: AD | Disposition: A | Discharge status: Home / Self Care | Payer: Self-pay | Source: Ambulatory Visit | Attending: Internal Medicine

## 2017-06-08 ENCOUNTER — Ambulatory Visit (HOSPITAL_COMMUNITY): Admission: RE | Admit: 2017-06-08 | Payer: Medicare Other | Source: Ambulatory Visit | Admitting: Cardiology

## 2017-06-08 ENCOUNTER — Other Ambulatory Visit: Payer: Self-pay

## 2017-06-08 ENCOUNTER — Inpatient Hospital Stay (HOSPITAL_COMMUNITY): Payer: Medicare Other | Admitting: Certified Registered Nurse Anesthetist

## 2017-06-08 ENCOUNTER — Encounter (HOSPITAL_COMMUNITY): Payer: Self-pay | Admitting: Emergency Medicine

## 2017-06-08 DIAGNOSIS — I38 Endocarditis, valve unspecified: Secondary | ICD-10-CM

## 2017-06-08 DIAGNOSIS — I4891 Unspecified atrial fibrillation: Secondary | ICD-10-CM

## 2017-06-08 HISTORY — PX: CARDIOVERSION: SHX1299

## 2017-06-08 LAB — BASIC METABOLIC PANEL
ANION GAP: 7 (ref 5–15)
BUN: 15 mg/dL (ref 6–20)
CALCIUM: 8.7 mg/dL — AB (ref 8.9–10.3)
CO2: 25 mmol/L (ref 22–32)
Chloride: 106 mmol/L (ref 101–111)
Creatinine, Ser: 0.97 mg/dL (ref 0.61–1.24)
GFR calc Af Amer: >60 mL/min (ref >60)
GLUCOSE: 110 mg/dL — AB (ref 65–99)
POTASSIUM: 4 mmol/L (ref 3.5–5.1)
SODIUM: 138 mmol/L (ref 135–145)

## 2017-06-08 LAB — PROTIME-INR
INR: 2
PROTHROMBIN TIME: 22.5 s — AB (ref 11.4–15.2)

## 2017-06-08 LAB — MAGNESIUM: MAGNESIUM: 2 mg/dL (ref 1.7–2.4)

## 2017-06-08 SURGERY — CARDIOVERSION
Anesthesia: General

## 2017-06-08 MED ORDER — PROPOFOL 10 MG/ML IV BOLUS
INTRAVENOUS | Status: DC | PRN
  Administered 2017-06-08: 70 mg via INTRAVENOUS

## 2017-06-08 MED ORDER — LIDOCAINE 2% (20 MG/ML) 5 ML SYRINGE
INTRAMUSCULAR | Status: DC | PRN
  Administered 2017-06-08: 80 mg via INTRAVENOUS

## 2017-06-08 MED ORDER — WARFARIN SODIUM 5 MG PO TABS: 5.0000 mg | ORAL_TABLET | ORAL | Status: DC

## 2017-06-08 MED ORDER — WARFARIN SODIUM 7.5 MG PO TABS
7.5000 mg | ORAL_TABLET | Freq: Once | ORAL | Status: AC
  Administered 2017-06-08: 7.5 mg via ORAL
  Filled 2017-06-08: qty 1

## 2017-06-08 NOTE — Discharge Summary (Signed)
ELECTROPHYSIOLOGY PROCEDURE DISCHARGE SUMMARY    Patient ID: [name] [name],  MRN: [id number], DOB/AGE: 1940/05/16 77 y.o.  Admit date: 06/06/2017 Discharge date: 06/09/2017  Primary Care Physician: [name], MD  Primary Cardiologist: Dr. [name] Electrophysiologist: new to Dr. Rayann Heman, this admission  Primary Discharge Diagnosis:  1.   Persistent atrial fibrillation status post Tikosyn loading this admission       CHA2D2Vasc is 3, on Warfarin        Secondary Discharge Diagnosis:  1. Rheumatic heart disease s/p bioposthetic AVr/MVR 2. Ascending Aortic aneurysm     Followed byu Dr. [name] 3. CVA (old)  Allergies  Allergen Reactions  . Naproxen Hives and Other (See Comments)    Aleve     Procedures This Admission:  1.  Tikosyn loading 2.  Direct current cardioversion on 06/08/17 by Dr Debara Pickett which successfully restored SR.  There were no early apparent complications.   Brief HPI: [name] [name] is a 77 y.o. male with a past medical history as noted above.  He was referred to the AFib clinic in the outpatient setting for treatment options of atrial fibrillation.  Risks, benefits, and alternatives to Tikosyn were reviewed with the patient who wished to proceed.    Hospital Course:  The patient was admitted and Tikosyn was initiated.  Renal function and electrolytes were followed during the hospitalization. His QTc remained stable.  On 06/08/17 the patient underwent direct current cardioversion which restored sinus rhythm.  He was monitored until discharge on telemetry which demonstrated NSR.  On the day of discharge, the patient was examined by Dr Rayann Heman who considered him stable for discharge to home.  Follow-up has been arranged with the AFib clinic in 1 week and with Dr Rayann Heman in 4 weeks.    Physical Exam: Vitals:   06/08/17 1414 06/08/17 2000 06/08/17 2100 06/09/17 0541  BP: 121/82 125/87 138/90 124/70  Pulse: 83 84  68  Resp: 16   15  Temp: 98.2 F (36.8 C)  97.9 F  (36.6 C) 97.8 F (36.6 C)  TempSrc: Oral  Oral Oral  SpO2: 96%   97%  Weight:    196 lb 11.2 oz (89.2 kg)  Height:        GEN- The patient is well appearing, alert and oriented x 3 today.   HEENT: normocephalic, atraumatic; sclera clear, conjunctiva pink; hearing intact; oropharynx clear; neck supple, no JVP Lymph- no cervical lymphadenopathy Lungs-  CTA b/l, normal work of breathing.  No wheezes, rales, rhonchi Heart-  RRR, no murmurs, rubs or gallops, PMI not laterally displaced GI- soft, non-tender, non-distended Extremities- no clubbing, cyanosis, or edema MS- no significant deformity or atrophy Skin- warm and dry, no rash or lesion Psych- euthymic mood, full affect Neuro- strength and sensation are intact   Labs:   Lab Results  Component Value Date   WBC 8.9 06/07/2017   HGB 12.0 (L) 06/07/2017   HCT 36.6 (L) 06/07/2017   MCV 95.1 06/07/2017   PLT 276 06/07/2017    Recent Labs  Lab 06/09/17 0401  NA 139  K 3.9  CL 105  CO2 24  BUN 20  CREATININE 0.97  CALCIUM 8.7*  GLUCOSE 95     Discharge Medications:  Allergies as of 06/09/2017      Reactions   Naproxen Hives, Other (See Comments)   Aleve      Medication List    TAKE these medications   atorvastatin 80 MG tablet Commonly known  as:  LIPITOR Take 1 tablet by mouth  daily What changed:    how much to take  how to take this  when to take this  additional instructions   clobetasol cream 0.05 % Commonly known as:  TEMOVATE Apply 1 application topically daily as needed (skin irritation).   Co Q-10 300 MG Caps Take 300 mg by mouth daily.   COSAMIN DS PO Take 1 tablet by mouth 2 (two) times daily.   docusate sodium 100 MG capsule Commonly known as:  COLACE Take 100 mg by mouth daily.   dofetilide 500 MCG capsule Commonly known as:  TIKOSYN Take 1 capsule (500 mcg total) by mouth 2 (two) times daily.   fish oil-omega-3 fatty acids 1000 MG capsule Take 1 g by mouth 2 (two) times  daily.   hydrocortisone 2.5 % rectal cream Commonly known as:  ANUSOL-HC Place 1 application rectally 2 (two) times daily. What changed:    when to take this  reasons to take this   loratadine 10 MG tablet Commonly known as:  CLARITIN Take 10 mg by mouth daily as needed for allergies.   losartan 25 MG tablet Commonly known as:  COZAAR Take 1 tablet (25 mg total) by mouth daily. What changed:  when to take this   metoprolol succinate 50 MG 24 hr tablet Commonly known as:  TOPROL-XL TAKE 1 TABLET BY MOUTH 2  TIMES DAILY AT 8 AM AND 10  PM. What changed:    how much to take  how to take this  when to take this  additional instructions   MULTIVITAL-M Tabs Take by mouth.   PROBIOTIC FORMULA PO Take 1 capsule by mouth 2 (two) times daily.   ranolazine 500 MG 12 hr tablet Commonly known as:  RANEXA Take 1 tablet (500 mg total) by mouth 2 (two) times daily.   sildenafil 100 MG tablet Commonly known as:  VIAGRA Take 1 tablet (100 mg total) by mouth as needed for erectile dysfunction.   Vitamin B-12 5000 MCG Subl Place 5,000 mcg under the tongue daily.   warfarin 5 MG tablet Commonly known as:  COUMADIN Take as directed. If you are unsure how to take this medication, talk to your nurse or doctor. Original instructions:  Take as directed by Coumadin Clinic What changed:    how much to take  how to take this  when to take this  additional instructions       Disposition:  Home  Follow-up Information    [facility] Follow up on 06/16/2017.   Specialty:  Cardiology Why:  9:30AM Contact information: [address] [id number] [place] [phone]       Thompson Grayer, MD Follow up on 07/17/2017.   Specialty:  Cardiology Why:  8:45AM Contact information: [place] [address] [phone]        [place] [facility] Follow up on 06/13/2017.    Specialty:  Cardiology Why:  3:15PM, coumadin clinic Contact information: [address] [place] [address] [phone]          Duration of Discharge Encounter: Greater than 30 minutes including physician time.  Angelena Form PA-C 06/09/2017 11:24 AM  06/09/2017 11:23 AM

## 2017-06-08 NOTE — Consult Note (Signed)
            [facility] CM Primary Care Navigator  06/08/2017  [name] [name] [birth year] [id number]   Seen patient at the bedsideto identify possible discharge needs. Patientreports having "irregular heartbeats"that had led to this admission (status post cardioversion, Tikosyn monitoring).  PatientendorsesDr.Thomas Jones with [employer] [facility] [employer] primary care provider.   Patientshared using [employer] [facility] [employer] Order service to obtainmedications without any problem.   Patientstatesthat wife has been mostly managinghis medications at [employer] use of "pill box" system filled once a week.  Patient reports that he has been driving prior to admission but his wife will be able toprovidetransportation to hisdoctors'appointments if needed after discharge.  Patientlives with wife at home whowill serve ashisprimary caregiverif needed whendischarged.   Anticipated discharge plan ishomeperpatient.  Patient voiced understanding to call primary care provider's office whenhereturns home, for a post discharge follow-up appointment within1- 2 weeksor sooner if needed.Patient letter (with PCP's contact number) was provided asareminder.  Explained topatientregardingTHN CM services available for health managementat homebut hedeniesany needs or concernsat this time and states being able to manage himself so far.  Patientexpressedunderstandingto seek referral from primary care provider to [facility] care management ifnecessaryand deemed appropriate for anyservices in the future.  [facility] care management information provided for future needs thathemay have.  Patienthowever,verbally agreedand opted forEMMIcalls tofollowup his recoveryat home.  Referral made for [facility] General calls after discharge.   For additional questions please contact:  Edwena Felty A. Aniyia Rane, BSN, RN-BC [facility] PRIMARY CARE Navigator Cell:  [phone]

## 2017-06-08 NOTE — Transfer of Care (Signed)
Immediate Anesthesia Transfer of Care Note  Patient: [name] [name]  Procedure(s) Performed: CARDIOVERSION (N/A )  Patient Location: Endoscopy Unit  Anesthesia Type:General  Level of Consciousness: awake, alert  and oriented  Airway & Oxygen Therapy: Patient Spontanous Breathing  Post-op Assessment: Report given to RN and Post -op Vital signs reviewed and stable  Post vital signs: Reviewed and stable  Last Vitals:  Vitals Value Taken Time  BP    Temp    Pulse    Resp    SpO2      Last Pain:  Vitals:   06/08/17 0908  TempSrc:   PainSc: 0-No pain      Patients Stated Pain Goal: 0 (15/04/13 6438)  Complications: No apparent anesthesia complications

## 2017-06-08 NOTE — Anesthesia Preprocedure Evaluation (Addendum)
Anesthesia Evaluation  Patient identified by MRN, date of birth, ID band Patient awake    Reviewed: Allergy & Precautions, NPO status , Patient's Chart, lab work & pertinent test results, reviewed documented beta blocker date and time   Airway Mallampati: III  TM Distance: >3 FB Neck ROM: Full    Dental no notable dental hx. (+) Teeth Intact   Pulmonary neg pulmonary ROS,    Pulmonary exam normal breath sounds clear to auscultation       Cardiovascular hypertension, Pt. on medications and Pt. on home beta blockers + Peripheral Vascular Disease  Normal cardiovascular exam+ dysrhythmias Atrial Fibrillation + Valvular Problems/Murmurs (s/p AVR, MVR)  Rhythm:Irregular Rate:Normal  ECG: SR, 1st degree AV block  Nuclear stress EF: 47%. No wall motion abnormalities There was no ST segment deviation noted during stress. Defect 1: There is a small defect of mild severity present in the apex location. No ischemia identified. The study is normal. This is a low risk study.   Neuro/Psych PSYCHIATRIC DISORDERS Psychosexual dysfunction with inhibited sexual excitementCVA, No Residual Symptoms    GI/Hepatic negative GI ROS, Neg liver ROS,   Endo/Other  Obesity  Renal/GU negative Renal ROS     Musculoskeletal negative musculoskeletal ROS (+)   Abdominal   Peds  Hematology HLD   Anesthesia Other Findings   Reproductive/Obstetrics                            Anesthesia Physical  Anesthesia Plan  ASA: III  Anesthesia Plan: General   Post-op Pain Management:    Induction: Intravenous  PONV Risk Score and Plan: 2 and Propofol infusion and Treatment may vary due to age or medical condition  Airway Management Planned: Natural Airway and Mask  Additional Equipment: None  Intra-op Plan:   Post-operative Plan:   Informed Consent: I have reviewed the patients History and Physical, chart, labs and  discussed the procedure including the risks, benefits and alternatives for the proposed anesthesia with the patient or authorized representative who has indicated his/her understanding and acceptance.   Dental advisory given  Plan Discussed with: CRNA and Anesthesiologist  Anesthesia Plan Comments:         Anesthesia Quick Evaluation

## 2017-06-08 NOTE — Progress Notes (Signed)
Progress Note   Subjective   Doing well today, the patient denies CP or SOB.  No new concerns  Inpatient Medications    Scheduled Meds: . atorvastatin  80 mg Oral QPM  . docusate sodium  100 mg Oral Daily  . dofetilide  500 mcg Oral BID  . losartan  25 mg Oral QPM  . metoprolol succinate  50 mg Oral BID  . omega-3 acid ethyl esters  1 g Oral BID  . ranolazine  500 mg Oral BID  . sodium chloride flush  3 mL Intravenous Q12H  . sodium chloride flush  3 mL Intravenous Q12H  . vitamin B-12  5,000 mcg Oral Daily  . warfarin  5 mg Oral Once per day on Sun Tue Wed Thu Sat  . [START ON 06/09/2017] warfarin  7.5 mg Oral Once per day on Mon Fri  . Warfarin - Pharmacist Dosing Inpatient   Does not apply q1800   Continuous Infusions: . sodium chloride    . sodium chloride     PRN Meds: sodium chloride, sodium chloride flush, sodium chloride flush   Vital Signs    Vitals:   06/07/17 0500 06/07/17 1432 06/07/17 2019 06/08/17 0554  BP: 103/67 117/88 (!) 120/98 (!) 120/93  Pulse: 77 75 (!) 105 88  Resp: 18  18 17   Temp: 97.9 F (36.6 C) 97.9 F (36.6 C) 97.7 F (36.5 C) 97.9 F (36.6 C)  TempSrc: Oral Oral Oral Oral  SpO2: 98% 97% 98% 98%  Weight: 90.4 kg (199 lb 4.7 oz)   88.9 kg (195 lb 14.4 oz)  Height:        Intake/Output Summary (Last 24 hours) at 06/08/2017 0740 Last data filed at 06/08/2017 0558 Gross per 24 hour  Intake -  Output 2 ml  Net -2 ml   Filed Weights   06/06/17 1214 06/07/17 0500 06/08/17 0554  Weight: 91 kg (200 lb 9.6 oz) 90.4 kg (199 lb 4.7 oz) 88.9 kg (195 lb 14.4 oz)    Telemetry    Atypical atrial flutter - Personally Reviewed  Physical Exam   GEN- The patient is well appearing, alert and oriented x 3 today.   Head- normocephalic, atraumatic Eyes-  Sclera clear, conjunctiva pink Ears- hearing intact Oropharynx- clear Neck- supple, Lungs- Clear to ausculation bilaterally, normal work of breathing Heart- irregular rate and rhythm  GI-  soft, NT, ND, + BS Extremities- no clubbing, cyanosis, or edema  MS- no significant deformity or atrophy Skin- no rash or lesion Psych- euthymic mood, full affect Neuro- strength and sensation are intact   Labs    Chemistry Recent Labs  Lab 06/06/17 1007 06/07/17 0351 06/08/17 0401  NA 138 138 138  K 4.3 4.1 4.0  CL 107 103 106  CO2 23 23 25   GLUCOSE 128* 104* 110*  BUN 18 14 15   CREATININE 0.99 0.97 0.97  CALCIUM 8.9 8.7* 8.7*  GFRNONAA >60 >60 >60  GFRAA >60 >60 >60  ANIONGAP 8 12 7      Hematology Recent Labs  Lab 06/07/17 0351  WBC 8.9  RBC 3.85*  HGB 12.0*  HCT 36.6*  MCV 95.1  MCH 31.2  MCHC 32.8  RDW 14.1  PLT 276    Cardiac EnzymesNo results for input(s): TROPONINI in the last 168 hours. No results for input(s): TROPIPOC in the last 168 hours.      Assessment & Plan    1.  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 Valvular heart disease Stable No change required today  3. History of ascending aortic aneurysm Followed by Dr [name]  Hopefully home tomorrow if qt remains stable post cardioversion  Thompson Grayer MD, [facility] 06/08/2017 7:40 AM

## 2017-06-08 NOTE — Progress Notes (Signed)
ANTICOAGULATION CONSULT NOTE - Follow Up Consult  Pharmacy Consult for Coumadin Indication: atrial fibrillation  Allergies  Allergen Reactions  . Naproxen Hives and Other (See Comments)    Aleve    Patient Measurements: Height: 5\' 8"  (172.7 cm) Weight: 195 lb (88.5 kg) IBW/kg (Calculated) : 68.4  Vital Signs: Temp: 97.8 F (36.6 C) (04/04 1028) Temp Source: Oral (04/04 1028) BP: 130/88 (04/04 1028) Pulse Rate: 60 (04/04 1000)  Labs: Recent Labs    06/06/17 0855 06/06/17 1007 06/07/17 0351 06/08/17 0401  HGB  --   --  12.0*  --   HCT  --   --  36.6*  --   PLT  --   --  276  --   LABPROT  --   --  24.2* 22.5*  INR 2.4  --  2.19 2.00  CREATININE  --  0.99 0.97 0.97    Estimated Creatinine Clearance: 72.2 mL/min (by C-G formula based on SCr of 0.97 mg/dL).  Assessment:  77 yr old male admitted for Tikosyn initiation, on Coumadin for atrial fibrillation.  Continue on Tikosyn 500 mcg q12h. S/p successful DCCV this am.    INR is therapeutic, but has trended down from 2.4 on admit to 2.0 today.   Home regimen: 5 mg daily, except 7.5 mg on Mondays and Fridays.   Goal of Therapy:  INR 2-3 Monitor platelets by anticoagulation protocol: Yes   Plan:   Increase today's Coumadin dose to 7.5 mg to try to avoid further INR decline.  Resume home regimen on 4/5: 5 mg daily, except 7.5 mg on Mondays and Fridays.  PT/INR in am.  To provide 14-days of Tikosyn at discharge.  Arty Baumgartner, [place] Pager: [phone] or [phone] 06/08/2017,12:14 PM

## 2017-06-08 NOTE — H&P (Signed)
   INTERVAL PROCEDURE H&P  History and Physical Interval Note:  06/08/2017 9:31 AM  [name] [name] has presented today for their planned procedure. The various methods of treatment have been discussed with the patient and family. After consideration of risks, benefits and other options for treatment, the patient has consented to the procedure.  The patients' outpatient history has been reviewed, patient examined, and no change in status from most recent office note within the past 30 days. I have reviewed the patients' chart and labs and will proceed as planned. Questions were answered to the patient's satisfaction.   Pixie Casino, MD, [facility], [place] Director of the Advanced Lipid Disorders &  Cardiovascular Risk Reduction Clinic Diplomate of the American Board of Clinical Lipidology Attending Cardiologist  Direct Dial: [phone]  Fax: [phone]  Website:  [email or web address] 06/08/2017, 9:31 AM

## 2017-06-08 NOTE — Anesthesia Postprocedure Evaluation (Signed)
Anesthesia Post Note  Patient: [name] [name]  Procedure(s) Performed: CARDIOVERSION (N/A )     Patient location during evaluation: PACU Anesthesia Type: General Level of consciousness: awake and alert Pain management: pain level controlled Vital Signs Assessment: post-procedure vital signs reviewed and stable Respiratory status: spontaneous breathing, nonlabored ventilation and respiratory function stable Cardiovascular status: blood pressure returned to baseline and stable Postop Assessment: no apparent nausea or vomiting Anesthetic complications: no    Last Vitals:  Vitals:   06/08/17 0945 06/08/17 1000  BP: 120/73 116/79  Pulse: (!) 58 60  Resp: 16 18  Temp: 36.5 C   SpO2: 99% 95%    Last Pain:  Vitals:   06/08/17 1000  TempSrc:   PainSc: 0-No pain                 Audry Pili

## 2017-06-08 NOTE — CV Procedure (Signed)
   CARDIOVERSION NOTE  Procedure: Electrical Cardioversion Indications:  Atrial Fibrillation  Procedure Details:  Consent: Risks of procedure as well as the alternatives and risks of each were explained to the (patient/caregiver).  Consent for procedure obtained.  Time Out: Verified patient identification, verified procedure, site/side was marked, verified correct patient position, special equipment/implants available, medications/allergies/relevent history reviewed, required imaging and test results available.  Performed  Patient placed on cardiac monitor, pulse oximetry, supplemental oxygen as necessary.  Sedation given: Propofol per anesthesia Pacer pads placed anterior and posterior chest.  Cardioverted 1 time(s).  Cardioverted at 150J biphasic.  Impression: Findings: Post procedure EKG shows: NSR Complications: None Patient did tolerate procedure well.  Plan: 1. Successful DCCV to NSR with a single 150J biphasic shock. 2. Further plans per cardiac EP service.  Time Spent Directly with the Patient:  30 minutes   Pixie Casino, MD, [facility], [place] Director of the Advanced Lipid Disorders &  Cardiovascular Risk Reduction Clinic Diplomate of the American Board of Clinical Lipidology Attending Cardiologist  Direct Dial: [phone]  Fax: [phone]  Website:  [email or web address] 06/08/2017, 9:37 AM

## 2017-06-09 ENCOUNTER — Encounter (HOSPITAL_COMMUNITY): Payer: Self-pay | Admitting: Internal Medicine

## 2017-06-09 ENCOUNTER — Other Ambulatory Visit: Payer: Self-pay

## 2017-06-09 LAB — BASIC METABOLIC PANEL
ANION GAP: 10 (ref 5–15)
BUN: 20 mg/dL (ref 6–20)
CO2: 24 mmol/L (ref 22–32)
Calcium: 8.7 mg/dL — ABNORMAL LOW (ref 8.9–10.3)
Chloride: 105 mmol/L (ref 101–111)
Creatinine, Ser: 0.97 mg/dL (ref 0.61–1.24)
GFR calc non Af Amer: >60 mL/min (ref >60)
GLUCOSE: 95 mg/dL (ref 65–99)
POTASSIUM: 3.9 mmol/L (ref 3.5–5.1)
Sodium: 139 mmol/L (ref 135–145)

## 2017-06-09 LAB — PROTIME-INR
INR: 1.77
Prothrombin Time: 20.4 seconds — ABNORMAL HIGH (ref 11.4–15.2)

## 2017-06-09 LAB — MAGNESIUM: Magnesium: 2 mg/dL (ref 1.7–2.4)

## 2017-06-09 MED ORDER — DOFETILIDE 500 MCG PO CAPS: 500.0000 ug | ORAL_CAPSULE | Freq: Two times a day (BID) | ORAL | 3 refills | Status: DC

## 2017-06-09 MED ORDER — DOFETILIDE 500 MCG PO CAPS: 500.0000 ug | ORAL_CAPSULE | Freq: Two times a day (BID) | ORAL | 11 refills | Status: DC

## 2017-06-09 NOTE — Progress Notes (Signed)
Discharge order obtained.  IV removed intact, telemetry monitor removed.  Reviewed AVS with patient/family, including medications, activity/restrictions, follow-up appointments.  Patient and family verbalized understanding.  Questions asked and answered.  Belongings given to family/patient.  Copy of AVS signature form placed in chart.   

## 2017-06-09 NOTE — Progress Notes (Addendum)
Progress Note   Subjective   Doing well today, the patient denies CP or SOB.  No new concerns  Inpatient Medications    Scheduled Meds: . atorvastatin  80 mg Oral QPM  . docusate sodium  100 mg Oral Daily  . dofetilide  500 mcg Oral BID  . losartan  25 mg Oral QPM  . metoprolol succinate  50 mg Oral BID  . omega-3 acid ethyl esters  1 g Oral BID  . ranolazine  500 mg Oral BID  . sodium chloride flush  3 mL Intravenous Q12H  . sodium chloride flush  3 mL Intravenous Q12H  . vitamin B-12  5,000 mcg Oral Daily  . [START ON 06/10/2017] warfarin  5 mg Oral Once per day on Sun Tue Wed Thu Sat  . warfarin  7.5 mg Oral Once per day on Mon Fri  . Warfarin - Pharmacist Dosing Inpatient   Does not apply q1800   Continuous Infusions: . sodium chloride    . sodium chloride     PRN Meds: sodium chloride, sodium chloride flush, sodium chloride flush   Vital Signs    Vitals:   06/08/17 1414 06/08/17 2000 06/08/17 2100 06/09/17 0541  BP: 121/82 125/87 138/90 124/70  Pulse: 83 84  68  Resp: 16   15  Temp: 98.2 F (36.8 C)  97.9 F (36.6 C) 97.8 F (36.6 C)  TempSrc: Oral  Oral Oral  SpO2: 96%   97%  Weight:    196 lb 11.2 oz (89.2 kg)  Height:        Intake/Output Summary (Last 24 hours) at 06/09/2017 0851 Last data filed at 06/09/2017 0542 Gross per 24 hour  Intake -  Output 2 ml  Net -2 ml   Filed Weights   06/08/17 0554 06/08/17 0856 06/09/17 0541  Weight: 195 lb 14.4 oz (88.9 kg) 195 lb (88.5 kg) 196 lb 11.2 oz (89.2 kg)    Telemetry    NSR- Personally Reviewed  Physical Exam   GEN- The patient is well appearing, alert and oriented x 3 today.   Head- normocephalic, atraumatic Eyes-  Sclera clear, conjunctiva pink Ears- hearing intact Oropharynx- clear Neck- supple, Lungs- Clear to ausculation bilaterally, normal work of breathing Heart- irregular rate and rhythm  GI- soft, NT, ND, + BS Extremities- no clubbing, cyanosis, or edema  MS- no significant  deformity or atrophy Skin- no rash or lesion Psych- euthymic mood, full affect Neuro- strength and sensation are intact   Labs    Chemistry Recent Labs  Lab 06/07/17 0351 06/08/17 0401 06/09/17 0401  NA 138 138 139  K 4.1 4.0 3.9  CL 103 106 105  CO2 23 25 24   GLUCOSE 104* 110* 95  BUN 14 15 20   CREATININE 0.97 0.97 0.97  CALCIUM 8.7* 8.7* 8.7*  GFRNONAA >60 >60 >60  GFRAA >60 >60 >60  ANIONGAP 12 7 10      Hematology Recent Labs  Lab 06/07/17 0351  WBC 8.9  RBC 3.85*  HGB 12.0*  HCT 36.6*  MCV 95.1  MCH 31.2  MCHC 32.8  RDW 14.1  PLT 276    Cardiac EnzymesNo results for input(s): TROPONINI in the last 168 hours. No results for input(s): TROPIPOC in the last 168 hours.      Assessment & Plan    1.  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 Valvular heart disease S/P h/o of tissue AVR and MVR at [facility] Stable  3. History of ascending aortic aneurysm Followed by Dr [name]  4. Coumadin Rx INR 1.77 today  Plan: Will discuss discharge with Dr Rayann Heman- hopefully home this am. Not sure why he is on Ranexa- no significant CAD by cath 2006, low risk Nuc Dec 2018. Consider stopping this if QTc becomes prolonged.    Thompson Grayer MD, [facility] 06/09/2017 8:51 AM   I have seen, examined the patient, and reviewed the above assessment and plan.  Changes to above are made where necessary.  On exam, RRR.  ekg reveals sinus rhythm with first degree AV block,  QTc is stable  DC to home Will need follow-up in 1 week in the AF clinic  Co Sign: Thompson Grayer, MD 06/09/2017 1:29 PM

## 2017-06-13 ENCOUNTER — Ambulatory Visit (HOSPITAL_COMMUNITY)
Admission: RE | Admit: 2017-06-13 | Discharge: 2017-06-13 | Disposition: A | Discharge status: Home / Self Care | Payer: Medicare Other | Source: Ambulatory Visit | Attending: Cardiology | Admitting: Cardiology

## 2017-06-13 ENCOUNTER — Other Ambulatory Visit: Payer: Self-pay

## 2017-06-13 ENCOUNTER — Ambulatory Visit (INDEPENDENT_AMBULATORY_CARE_PROVIDER_SITE_OTHER): Payer: Medicare Other | Admitting: *Deleted

## 2017-06-13 VITALS — BP 140/80 | HR 73 | Wt 199.8 lb

## 2017-06-13 DIAGNOSIS — E785 Hyperlipidemia, unspecified: Secondary | ICD-10-CM | POA: Diagnosis not present

## 2017-06-13 DIAGNOSIS — Z8673 Personal history of transient ischemic attack (TIA), and cerebral infarction without residual deficits: Secondary | ICD-10-CM | POA: Insufficient documentation

## 2017-06-13 DIAGNOSIS — I482 Chronic atrial fibrillation, unspecified: Secondary | ICD-10-CM

## 2017-06-13 DIAGNOSIS — I059 Rheumatic mitral valve disease, unspecified: Secondary | ICD-10-CM

## 2017-06-13 DIAGNOSIS — Z7901 Long term (current) use of anticoagulants: Secondary | ICD-10-CM | POA: Insufficient documentation

## 2017-06-13 DIAGNOSIS — Z5181 Encounter for therapeutic drug level monitoring: Secondary | ICD-10-CM | POA: Diagnosis not present

## 2017-06-13 DIAGNOSIS — I10 Essential (primary) hypertension: Secondary | ICD-10-CM | POA: Insufficient documentation

## 2017-06-13 DIAGNOSIS — I712 Thoracic aortic aneurysm, without rupture: Secondary | ICD-10-CM | POA: Insufficient documentation

## 2017-06-13 DIAGNOSIS — I359 Nonrheumatic aortic valve disorder, unspecified: Secondary | ICD-10-CM

## 2017-06-13 DIAGNOSIS — Z79899 Other long term (current) drug therapy: Secondary | ICD-10-CM | POA: Diagnosis not present

## 2017-06-13 DIAGNOSIS — I4892 Unspecified atrial flutter: Secondary | ICD-10-CM | POA: Diagnosis not present

## 2017-06-13 DIAGNOSIS — I484 Atypical atrial flutter: Secondary | ICD-10-CM | POA: Diagnosis not present

## 2017-06-13 DIAGNOSIS — Z953 Presence of xenogenic heart valve: Secondary | ICD-10-CM | POA: Insufficient documentation

## 2017-06-13 LAB — BASIC METABOLIC PANEL
Anion gap: 9 (ref 5–15)
BUN: 19 mg/dL (ref 6–20)
CHLORIDE: 105 mmol/L (ref 101–111)
CO2: 23 mmol/L (ref 22–32)
CREATININE: 0.96 mg/dL (ref 0.61–1.24)
Calcium: 9.1 mg/dL (ref 8.9–10.3)
GFR calc non Af Amer: >60 mL/min (ref >60)
Glucose, Bld: 108 mg/dL — ABNORMAL HIGH (ref 65–99)
Potassium: 4 mmol/L (ref 3.5–5.1)
Sodium: 137 mmol/L (ref 135–145)

## 2017-06-13 LAB — MAGNESIUM: Magnesium: 2 mg/dL (ref 1.7–2.4)

## 2017-06-13 LAB — POCT INR: INR: 2

## 2017-06-13 NOTE — Patient Instructions (Signed)
Description   Continue same dose of coumadin  1 tablet daily except 1.5 tablets on Mondays and Fridays. Recheck INR in 2 weeks

## 2017-06-13 NOTE — Patient Instructions (Signed)
Labs drawn today (if we do not call you, then your lab work was stable)   Your physician recommends that you schedule a follow-up appointment in: 6 months (October., 2019) with Dr. [name].Please Call an Schedule Appointment  (august,2019)

## 2017-06-14 ENCOUNTER — Other Ambulatory Visit (HOSPITAL_COMMUNITY): Payer: Self-pay | Admitting: *Deleted

## 2017-06-14 MED ORDER — DOFETILIDE 500 MCG PO CAPS: 500.0000 ug | ORAL_CAPSULE | Freq: Two times a day (BID) | ORAL | 1 refills | Status: DC

## 2017-06-14 NOTE — Progress Notes (Signed)
Patient ID: [name] [name], male   DOB: 1940/11/07, 77 y.o.   MRN: [id number] PCP: Dr. Maudie Mercury Cardiology: Dr. [name]  77 y.o. with history of rheumatic fever and AI/[name] s/p bioprosthetic AVR and MVR at the [facility] as well as MAZE procedure all in 2006 presents for followup of valvular heart disease and atrial flutter/fibrillation.  He also has an ascending aortic aneurysm.   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  I started him on dronedarone and coumadin with plan for cardioversion after 4 wks of therapeutic anticoagulation if he did not convert back to NSR on his own.  He went back into NSR spontaneously and later stopped coumadin. Atrial flutter recurred in 3/15 and again resolved spontaneously.  In 6/15, patient was admitted with transient dysarthria (resolved in about 10 minutes).  However, he was found to have evidence for CVA by MRI.  Therefore, he was started on coumadin. He was noted to be in atypical atrial flutter again.  Ranolazine was added to dronedarone.    In 8/15, [name] [name] was seen in consultation at the [facility] clinic.  He had atrial flutter ablation at [facility] in 11/15.    He was seen in followup at [facility] in 2/16.  Ranolazine was stopped.  Echo in 2/16 showed EF 55% with stable bioprosthetic mitral and aortic valves.  CTA chest showed stable 5 cm ascending aortic aneurysm.  He went back into atypical atrial flutter in 4/16.  Ranolazine was restarted and he was cardioverted back to NSR.    Last visit to [facility] was in 5/18.  Echo at that time showed EF 60-65% with stable bioprosthetic mitral and aortic valves.  CTA showed 5.0 cm aortic root and ascending aorta.  Cardiolite in 12/18 showed no ischemia. He wore a 30 day monitor in 12/18 with no arrhythmia.    He went into atrial flutter again during a respiratory illness in 2/19.  On 04/24/17, he was cardioverted back to NSR.  He thinks that he went  into atrial flutter again later on that day. He was back in atrial flutter at followup appointment. He was then admitted for Tikosyn loading in 4/19 and was cardioverted back to NSR after the load.   He returns for followup of atrial flutter and valvular disease.  He feels like he has been in NSR since leaving the hospital from Tikosyn load.  He feels better in NSR.  No exertional dyspnea or fatigue.  No chest pain.  No palpitations or lightheadedness. He is still on ranolazine.   ECG (12/12/11): Atypical atrial flutter versus atrial tachycardia with rate around 100 bpm, QTc normal.  ECG (12/29/11): NSR, 1st degree AV block PR 242 msec.    ECG (12/13): NSR, 1st degree AV block PR 244 msec    ECG (10/14): NSR, 1st degree AV block ECG (3/15): Atypical atrial flutter with rate 87, inferior T wave inversions ECG (6/15): Atypical atrial flutter with rate 77 ECG (8/15): atypical atrial flutter rate 82 ECG (10/15): NSR, 1st degree AV block ECG (12/15): NSR, 1st degree AV block, QTc 410 msec ECG (5/16): NSR, 1st degree AV block, QTc 419 msec ECG (5/17): NSR, 1st degree AV block 270 msec, QTc 433 ECG (2/18): NSR, 1st degree AVB, nonspecific ST-T changes, QTc 435 msec ECG (11/18): NSR, 1st degree AVB, QTc 424 msec ECG (2/19, personally reviewed): atypical atrial flutter at 87, QTc 442 msec ECG (4/19, personally reviewed): NSR, 1st degree  AVB, QTc 453 msec  Labs (3/14): LDL 64, TSH normal, K 3.9, creatinine 1.0     Labs (6/15): LDL 54, HDL 48, creatinine 0.95   Labs (9/15): LDL 54, HDL 25 Labs (10/15): K 4.5, creatinine 1.3, AST 45, ALT 64 Labs (4/16): K 4.3, creatinine 1.28, HCT 40.7 Labs (5/17): K 4.1, creatinine 1.16, LDL 66, HDL 47 Labs (2/18): LDL 42, HDL 43, K 4.4, creatinine 1.48 Labs (3/18): hgb 13.8 Labs (2/19): LDL 59 Labs (4/19): K 3.9, creatinine 0.97, Mg 2.0                                                                                                                                  PMH: 1. Atrial fibrillation: s/p MAZE and LAA ligation in 2006.  No documented atrial fibrillation since operation.  2. Atypical atrial flutter: 10/13, 3/15, and again in 6/15.  Recurrent 4/16 with DCCV.  - Event monitor (12/18): no significant arrhythmia.  - Recurrence in 2/19: DCCV to NSR but quickly recurred.  - Admitted in 4/19 for Tikosyn loading and cardioverted to NSR.  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   - Echo 5/11 with EF 60-65%, normal bioprosthetic aortic valve, normal bioprosthetic mitral valve, moderate LAE.   - Echo 11/12 at [facility] looked ok per report.  - Echo (10/13) with EF 55-60%, mild LVH, normal bioprosthetic aortic and mitral valves, moderate LAE, PA systolic pressure 35 mmHg.   - Echo (6/15) with EF 60%, moderate LVH, normal-appearing bioprosthetic MV and AoV, PA systolic pressure 32 mmHg.   - Echo 9/15 [facility]) with EF 55%, mild LVH, PASP 52 mmHg, severe LAE, bioprosthetic mitral valve with no [name] and mean gradient 11 mmHg, moderate TR, bioprosthetic aortic valve with no AI and mean gradient 9 mmHg, dilated ascending aorta to 5 cm.   - Echo (2/16, [facility]) with EF 55%, stable bioprosthetic aortic and mitral valves (AoV mean gradient 7 mmHg, MV mean gradient 4 mmHg), normal RV size and systolic function.  - Echo (12/16, [facility]) with EF 58%, mild RV dilation with low normal systolic function, PA systolic pressure 34 mmHg, bioprosthetic MV and AoV functioning normally.  - Echo (5/18, [name]) with EF 60-65%, RV normal size and systolic function, moderate LAE, bioprosthetic MV mean gradient 6 with no [name], bioprosthetic aortic valve with mean gradient 11 and no AI, PASP 41 mmHg.  - Echo (2/19): EF 60-65%, moderate LVH, bioprosthetic aortic and mitral valves functioning normally, PASP 23 mmHg, aortic root 4.5 cm.  4. Hyperlipidemia 5. L-spine surgery 6. LHC  with no significant coronary disease in 2006 pre-op.  - Cardiolite (12/18): No ischemia.  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  CTA chest 9/15 with dilated sinuses of valsalva to 5.0 cm and ascending aorta 4.9 cm.  CTA chest (2/16) with 5.0 cm ascending aorta.  CTA chest (12/16) with 4.9 cm aortic root at SOV, 4.8 cm ascending aorta.  - CT chest (5/18, [facility]): 5.0 cm aortic root, 5.0 cm ascending aorta.   SH: Married, retired Forensic psychologist [name], [place], [place]).  2 sons.  Never smoked.   FH: No premature CAD.   ROS: All systems reviewed and negative except as per HPI.  Current Outpatient Medications  Medication Sig Dispense Refill  . atorvastatin (LIPITOR) 80 MG tablet Take 1 tablet by mouth  daily (Patient taking differently: Take 80 mg by mouth every evening. ) 90 tablet 3  . clobetasol cream (TEMOVATE) 1.24 % Apply 1 application topically daily as needed (skin irritation).     . Coenzyme Q10 (CO Q-10) 300 MG CAPS Take 300 mg by mouth daily.    . Cyanocobalamin (VITAMIN B-12) 5000 MCG SUBL Place 5,000 mcg under the tongue daily.    [name] docusate sodium (COLACE) 100 MG capsule Take 100 mg by mouth daily.    [name] dofetilide (TIKOSYN) 500 MCG capsule Take 1 capsule (500 mcg total) by mouth 2 (two) times daily. 180 capsule 3  . fish oil-omega-3 fatty acids 1000 MG capsule Take 1 g by mouth 2 (two) times daily.     . Glucosamine-Chondroitin (COSAMIN DS PO) Take 1 tablet by mouth 2 (two) times daily.    . hydrocortisone (ANUSOL-HC) 2.5 % rectal cream Place 1 application rectally 2 (two) times daily. (Patient taking differently: Place 1 application rectally 2 (two) times daily as needed for hemorrhoids or anal itching. ) 30 g 0  . loratadine (CLARITIN) 10 MG tablet Take 10 mg by mouth daily as needed for allergies.     [name] losartan (COZAAR) 25 MG tablet Take 1 tablet (25 mg total) by mouth daily.  90 tablet 3  . metoprolol succinate (TOPROL-XL) 50 MG 24 hr tablet TAKE 1 TABLET BY MOUTH 2  TIMES DAILY AT 8 AM AND 10  PM. 180 tablet 3  . Multiple Vitamins-Minerals (MULTIVITAL-M) TABS Take by mouth.    . Probiotic Product (PROBIOTIC FORMULA PO) Take 1 capsule by mouth 2 (two) times daily.     . ranolazine (RANEXA) 500 MG 12 hr tablet Take 1 tablet (500 mg total) by mouth 2 (two) times daily. 180 tablet 3  . sildenafil (VIAGRA) 100 MG tablet Take 1 tablet (100 mg total) by mouth as needed for erectile dysfunction. 10 tablet 6  . warfarin (COUMADIN) 5 MG tablet Take as directed by Coumadin Clinic (Patient taking differently: Take 5-7.5 mg by mouth See admin instructions. Take 7.5 mg by mouth daily on Monday & Friday. Take 5 mg by mouth daily on all other days) 120 tablet 1   No current facility-administered medications for this encounter.     BP 140/80   Pulse 73   Wt 199 lb 12 oz (90.6 kg)   SpO2 98%   BMI 30.37 kg/m  General: NAD Neck: No JVD, no thyromegaly or thyroid nodule.  Lungs: Clear to auscultation bilaterally with normal respiratory effort. CV: Nondisplaced PMI.  Heart regular S1/S2, no S3/S4, 2/6 early SEM RUSB.  No peripheral edema.  No carotid bruit.  Normal pedal pulses.  Abdomen: Soft, nontender, no hepatosplenomegaly, no distention.  Skin: Intact without lesions or rashes.  Neurologic: Alert and oriented x 3.  Psych: Normal affect. Extremities: No clubbing or cyanosis.  HEENT: Normal.   Assessment/Plan: 1. Atypical atrial flutter: Patient had a MAZE procedure with LAA ligation  at the time of his valve replacement.  Since then, he has had occasional episodes of atypical atrial flutter. He had episodes in 10/13 and 3/15 that converted back to NSR on dronedarone and ranolazine. He was noted to be back in atrial flutter in 6/15 in the setting of a CVA.  He had ablation at [facility] in 11/15.  Atrial flutter recurred after ranolazine was stopped and he had DCCV again  in 4/16.  Atrial flutter recurred in 2/19, DCCV back to NSR but atrial flutter quickly recurred. Tikosyn was then loaded and he was cardioverted back to NSR in 4/19.  He remains in NSR today.  - Continue coumadin.  - Continue current Toprol XL. - Continue Tikosyn.  QTc ok on ECG today. Check BMET/Mg.  - He is still on ranolazine, will ask Dr. Rayann Heman about this => probably should stop it at this point.  - Another flutter ablation is a consideration, he is going to go to the [facility] again some time this Spring, will re-address with his EP physician there.   2. 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  I reviewed today's echo, EF is normal and the prosthetic valves are functioning normally.  3. HTN: BP needs good control given ascending aortic aneurysm. BP high today but rushed in, SBP 120s when he checks at home.  - Continue losartan and Toprol XL.    4. Hyperlipidemia: Good lipids in 2/19.     5. CVA: Suspect this was related to atrial flutter (despite LA appendage ligation).  He is now on coumadin. No residual neurologic deficits.  Continue long-term anticoagulation. 6. Thoracic aortic aneurysm: Dilated aortic root at sinuses of valsalva and ascending aorta to 5 cm, last CTA in 5/18 (stable).   - He will need CTA chest again in 5/19 to follow aneurysm => will have this done during his trip to [facility].    - Continue Toprol XL given some evidence that beta blockers may attenuate thoracic aortic aneurysm growth.   Followup  6 months but will need BMET/Mg in 3 months.   Loralie Champagne 06/14/2017

## 2017-06-16 ENCOUNTER — Ambulatory Visit (HOSPITAL_COMMUNITY): Payer: Medicare Other | Admitting: Nurse Practitioner

## 2017-06-19 ENCOUNTER — Telehealth (HOSPITAL_COMMUNITY): Payer: Self-pay | Admitting: *Deleted

## 2017-06-19 NOTE — Telephone Encounter (Signed)
Patient called in stating he noticed his HR was elevated today - sent in a EKG from his phone app that confirmed aflutter HR 93. Will try increasing metoprolol to 75mg  BID and see if this will convert back to NSR. He will call back tomorrow with update.

## 2017-06-23 ENCOUNTER — Encounter (HOSPITAL_COMMUNITY): Payer: Self-pay | Admitting: Nurse Practitioner

## 2017-06-23 ENCOUNTER — Telehealth: Payer: Self-pay | Admitting: Pharmacist

## 2017-06-23 ENCOUNTER — Ambulatory Visit (HOSPITAL_COMMUNITY)
Admission: RE | Admit: 2017-06-23 | Discharge: 2017-06-23 | Disposition: A | Discharge status: Home / Self Care | Payer: Medicare Other | Source: Ambulatory Visit | Attending: Nurse Practitioner | Admitting: Nurse Practitioner

## 2017-06-23 VITALS — BP 126/82 | HR 100 | Ht 68.0 in | Wt 198.6 lb

## 2017-06-23 DIAGNOSIS — Z952 Presence of prosthetic heart valve: Secondary | ICD-10-CM | POA: Insufficient documentation

## 2017-06-23 DIAGNOSIS — I484 Atypical atrial flutter: Secondary | ICD-10-CM

## 2017-06-23 DIAGNOSIS — K5790 Diverticulosis of intestine, part unspecified, without perforation or abscess without bleeding: Secondary | ICD-10-CM | POA: Diagnosis not present

## 2017-06-23 DIAGNOSIS — I480 Paroxysmal atrial fibrillation: Secondary | ICD-10-CM | POA: Insufficient documentation

## 2017-06-23 DIAGNOSIS — Z79899 Other long term (current) drug therapy: Secondary | ICD-10-CM | POA: Diagnosis not present

## 2017-06-23 DIAGNOSIS — Z8673 Personal history of transient ischemic attack (TIA), and cerebral infarction without residual deficits: Secondary | ICD-10-CM | POA: Insufficient documentation

## 2017-06-23 DIAGNOSIS — Z7901 Long term (current) use of anticoagulants: Secondary | ICD-10-CM | POA: Insufficient documentation

## 2017-06-23 DIAGNOSIS — E785 Hyperlipidemia, unspecified: Secondary | ICD-10-CM | POA: Diagnosis not present

## 2017-06-23 LAB — CBC
HCT: 42.4 % (ref 39.0–52.0)
Hemoglobin: 14 g/dL (ref 13.0–17.0)
MCH: 31.4 pg (ref 26.0–34.0)
MCHC: 33 g/dL (ref 30.0–36.0)
MCV: 95.1 fL (ref 78.0–100.0)
PLATELETS: 254 10*3/uL (ref 150–400)
RBC: 4.46 MIL/uL (ref 4.22–5.81)
RDW: 13.8 % (ref 11.5–15.5)
WBC: 8.5 10*3/uL (ref 4.0–10.5)

## 2017-06-23 LAB — BASIC METABOLIC PANEL
ANION GAP: 11 (ref 5–15)
BUN: 21 mg/dL — ABNORMAL HIGH (ref 6–20)
CALCIUM: 9.3 mg/dL (ref 8.9–10.3)
CO2: 23 mmol/L (ref 22–32)
CREATININE: 1.04 mg/dL (ref 0.61–1.24)
Chloride: 107 mmol/L (ref 101–111)
GFR calc Af Amer: >60 mL/min (ref >60)
GLUCOSE: 112 mg/dL — AB (ref 65–99)
Potassium: 4.3 mmol/L (ref 3.5–5.1)
Sodium: 141 mmol/L (ref 135–145)

## 2017-06-23 LAB — PROTIME-INR
INR: 1.87
PROTHROMBIN TIME: 21.3 s — AB (ref 11.4–15.2)

## 2017-06-23 LAB — MAGNESIUM: Magnesium: 2.1 mg/dL (ref 1.7–2.4)

## 2017-06-23 NOTE — Telephone Encounter (Signed)
INR drawn at afib clinic today pending DCCV - low at 1.87. Pt aware to take an extra 1/2 tablet today then continue normal dose. Rechecking INR in 1 week in clinic and will wait for 4 weekly therapeutic readings prior to DCCV.

## 2017-06-23 NOTE — Progress Notes (Addendum)
Primary Care Physician: [name], MD Referring Physician: Dr. [name] EP: Dr. Michail Sermon [name] [name] is a 77 y.o. male with a h/o  AI/MR s/p bioprosthetic AVR and MVR at the [facility] as well as MAZE procedure for paroxysmal afib all in 2006,atrial flutter ablation at [facility] in 11/15.     He was seen in followup at [facility] in 2/16.  Ranolazine was stopped.  Echo in 2/16 showed EF 55% with stable bioprosthetic mitral and aortic valves.  CTA chest showed stable 5 cm ascending aortic aneurysm.  He went back into atypical atrial flutter in 4/16.  Ranolazine was restarted and he was cardioverted back to NSR.    A couple of weeks ago, he went into atrial flutter again during a respiratory illness.  On 04/24/17, he was cardioverted back to NSR.  He thinks that he went into atrial flutter again later on that day. He was in atrial flutter on f/u with Dr. [name] 2/22, who referred to afib clinic for discussion of tikosyn use.[name]  He presented to afib clinic 05/02/17, to discuss admit for tikosyn. He is in SR today. He continues on multaq and ranolazine. He is on warfarin and was subtherapeutic yesterday at 1.9 and today at 1.7 Coumadin clinic is adjusting coumadin. Pt was in favor of pursing Tikosyn after 4 therapeutic INR's are obtained..  F/u in afib clinic, 4/2 for tikosyn admit. He has had 5 therapeutic INR's. He has been off multaq since last Friday. No benadryl use. He is in atrial flutter today, rate controlled. Aware of cost of drug.  F/u in fib clinic,4/19, unfortunately, pt went back into atrial flutter last Sunday. EKG continues to show atypical atrial flutter. BB was increased for v rates 90-110. HR is 100 today. Does not feel bad but noted higher HR when checking V/S at home. He plans to be seen at the [name] in the next 6-8 weeks and will discuss with them repeat ablation.  Today, he denies symptoms of  chest pain, shortness of breath, orthopnea, PND,  lower extremity edema, dizziness, presyncope, syncope, or neurologic sequela. The patient is tolerating medications without difficulties and is otherwise without complaint today.   Past Medical History:  Diagnosis Date  . Acute rheumatic heart disease, unspecified    childhood, age  9 & 10  . Acute rheumatic pericarditis   . Atrial fibrillation (HCC)    history  . Diverticulosis   . External hemorrhoids without mention of complication   . 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 Lesion of ulnar nerve   . 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 Other and unspecified hyperlipidemia   . Previous back surgery 1978, jan 2007  . Psychosexual dysfunction with inhibited sexual excitement   . SOB (shortness of breath)    "with heavy exercise"  . Stroke (HCC)    Past Surgical History:  Procedure Laterality Date  . AORTIC AND MITRAL VALVE REPLACEMENT     07 /2006  . CARDIOVERSION     3 times from 2004-2006  . CARDIOVERSION N/A 09/26/2013   Procedure: CARDIOVERSION;  Surgeon: Larey Dresser, MD;  Location: [place];  Service: Cardiovascular;  Laterality: N/A;  . CARDIOVERSION N/A 06/19/2014   Procedure: CARDIOVERSION;  Surgeon: Jerline Pain, MD;  Location: [facility] ENDOSCOPY;  Service: Cardiovascular;  Laterality: N/A;  . CARDIOVERSION N/A 04/24/2017   Procedure: CARDIOVERSION;  Surgeon: Larey Dresser, MD;  Location: [place];  Service: Cardiovascular;  Laterality: N/A;  . CARDIOVERSION N/A 06/08/2017   Procedure: CARDIOVERSION;  Surgeon: Pixie Casino, MD;  Location: [facility] ENDOSCOPY;  Service: Cardiovascular;  Laterality: N/A;  . COLONOSCOPY    . laminectomies     10/1975 and in 03/2005  . TONSILLECTOMY     1950    Current Outpatient Medications  Medication Sig Dispense Refill  . atorvastatin (LIPITOR) 80 MG tablet Take 1 tablet by mouth  daily (Patient taking differently: Take 80 mg by mouth every evening. ) 90 tablet 3  . clobetasol cream (TEMOVATE) 3.78 % Apply  1 application topically daily as needed (skin irritation).     . Coenzyme Q10 (CO Q-10) 300 MG CAPS Take 300 mg by mouth daily.    . Cyanocobalamin (VITAMIN B-12) 5000 MCG SUBL Place 5,000 mcg under the tongue daily.    [name] docusate sodium (COLACE) 100 MG capsule Take 100 mg by mouth daily.    [name] dofetilide (TIKOSYN) 500 MCG capsule Take 1 capsule (500 mcg total) by mouth 2 (two) times daily. 14 capsule 1  . fish oil-omega-3 fatty acids 1000 MG capsule Take 1 g by mouth 2 (two) times daily.     . Glucosamine-Chondroitin (COSAMIN DS PO) Take 1 tablet by mouth 2 (two) times daily.    [name] loratadine (CLARITIN) 10 MG tablet Take 10 mg by mouth daily as needed for allergies.     [name] losartan (COZAAR) 25 MG tablet Take 1 tablet (25 mg total) by mouth daily. 90 tablet 3  . metoprolol succinate (TOPROL-XL) 50 MG 24 hr tablet TAKE 1 TABLET BY MOUTH 2  TIMES DAILY AT 8 AM AND 10  PM. (Patient taking differently: 75 mg. TAKE 1 TABLET BY MOUTH 2  TIMES DAILY AT 8 AM AND 10  PM.) 180 tablet 3  . Multiple Vitamins-Minerals (MULTIVITAL-M) TABS Take by mouth.    . Probiotic Product (PROBIOTIC FORMULA PO) Take 1 capsule by mouth 2 (two) times daily.     . sildenafil (VIAGRA) 100 MG tablet Take 1 tablet (100 mg total) by mouth as needed for erectile dysfunction. 10 tablet 6  . warfarin (COUMADIN) 5 MG tablet Take as directed by Coumadin Clinic (Patient taking differently: Take 5-7.5 mg by mouth See admin instructions. Take 7.5 mg by mouth daily on Monday & Friday. Take 5 mg by mouth daily on all other days) 120 tablet 1  . hydrocortisone (ANUSOL-HC) 2.5 % rectal cream Place 1 application rectally 2 (two) times daily. (Patient not taking: Reported on 06/23/2017) 30 g 0   No current facility-administered medications for this encounter.     Allergies  Allergen Reactions  . Naproxen Hives and Other (See Comments)    Aleve    Social History   Socioeconomic History  . Marital status: Married    Spouse name: Not on file   . Number of children: 2  . Years of education: BS  . Highest education level: Not on file  Occupational History  . Occupation: retired  Scientific laboratory technician  . Financial resource strain: Not on file  . Food insecurity:    Worry: Not on file    Inability: Not on file  . Transportation needs:    Medical: Not on file    Non-medical: Not on file  Tobacco Use  . Smoking status: Never Smoker  . Smokeless tobacco: Never Used  Substance and Sexual Activity  . Alcohol use: Yes    Alcohol/week: 1.8 oz  Types: 1 Glasses of wine, 1 Cans of beer, 1 Shots of liquor per week    Comment: wine occasionally  . Drug use: No  . Sexual activity: Yes    Partners: Female  Lifestyle  . Physical activity:    Days per week: Not on file    Minutes per session: Not on file  . Stress: Not on file  Relationships  . Social connections:    Talks on phone: Not on file    Gets together: Not on file    Attends religious service: Not on file    Active member of club or organization: Not on file    Attends meetings of clubs or organizations: Not on file    Relationship status: Not on file  . Intimate partner violence:    Fear of current or ex partner: Not on file    Emotionally abused: Not on file    Physically abused: Not on file    Forced sexual activity: Not on file  Other Topics Concern  . Not on file  Social History Narrative   [name] [name] - BS, [place]. married - '67. 2 sons - '74, '77  one son is a Nurse, learning disability for [employer]; 4 grandchildren. 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 marriage in good health. End-of-life: discussed issues and provided packet with the MOST form and out of facility order.    Family History  Problem Relation Age of Onset  . Macular degeneration Mother        macular degeneration  . Cancer Father        intestinal/GI  . Diabetes Paternal Aunt   . Heart attack Maternal Grandfather   . Diabetes  Brother     ROS- All systems are reviewed and negative except as per the HPI above  Physical Exam: Vitals:   06/23/17 0934  BP: 126/82  Pulse: 100  Weight: 198 lb 9.6 oz (90.1 kg)  Height: 5\' 8"  (1.727 m)   Wt Readings from Last 3 Encounters:  06/23/17 198 lb 9.6 oz (90.1 kg)  06/13/17 199 lb 12 oz (90.6 kg)  06/09/17 196 lb 11.2 oz (89.2 kg)    Labs: Lab Results  Component Value Date   NA 137 06/13/2017   K 4.0 06/13/2017   CL 105 06/13/2017   CO2 23 06/13/2017   GLUCOSE 108 (H) 06/13/2017   BUN 19 06/13/2017   CREATININE 0.96 06/13/2017   CALCIUM 9.1 06/13/2017   MG 2.0 06/13/2017   Lab Results  Component Value Date   INR 2.0 06/13/2017   Lab Results  Component Value Date   CHOL 110 04/28/2017   HDL 32 (L) 04/28/2017   LDLCALC 59 04/28/2017   TRIG 97 04/28/2017     GEN- The patient is well appearing, alert and oriented x 3 today.   Head- normocephalic, atraumatic Eyes-  Sclera clear, conjunctiva pink Ears- hearing intact Oropharynx- clear Neck- supple, no JVP Lymph- no cervical lymphadenopathy Lungs- Clear to ausculation bilaterally, normal work of breathing Heart- irregular rate and rhythm, no murmurs, rubs or gallops, PMI not laterally displaced GI- soft, NT, ND, + BS Extremities- no clubbing, cyanosis, or edema MS- no significant deformity or atrophy Skin- no rash or lesion Psych- euthymic mood, full affect Neuro- strength and sensation are intact  EKG- atypical  atrial flutter at 100 bpm, qrs int 96 ms, qtc 477 ms Epic records reviewed Echo-Left ventricle: The cavity size was  normal. Wall thickness was   increased in a pattern of moderate LVH. Systolic function was   normal. The estimated ejection fraction was in the range of 60%   to 65%. Wall motion was normal; there were no regional wall   motion abnormalities. Indeterminant diastolic function. - Aortic valve: Bioprosthetic aortic valve. No significant   stenosis. There was trivial  regurgitation. Mean gradient (S): 6   mm Hg. - Aorta: Moderately dilated aortic root. Aortic root dimension: 45   mm (ED). - Mitral valve: Bioprosthetic mitral valve. No significant   stenosis. There was no significant regurgitation. Mean gradient   (D): 6 mm Hg. Valve area by pressure half-time: 3.01 cm^2. - Left atrium: The atrium was moderately dilated. - Right ventricle: The cavity size was normal. Systolic function   was normal. - Pulmonary arteries: PA peak pressure: 23 mm Hg (S). - Inferior vena cava: The vessel was normal in size. The   respirophasic diameter changes were in the normal range (= 50%),   consistent with normal central venous pressure.  Impressions:  - Normal LV size with moderate LV hypertrophy. EF 60-65%. Normal RV   size and systolic function. Bioprosthetic aortic valve with   normal function. Bioprosthetic mitral valve with normal function.   Moderately dilated aortic root.    Assessment and Plan: 1. Paroxysmal afib/flutter  Despite loading of tikosyn, now back in atypical atrial flutter 4/14 Continue  dofetilide 500 mg bid Discussed cardioversion with pt and Dr. [name] and he is wanting to purse qtc looks acceptable, today in atrial  flutter at 477 ms Will stop ranolazine as Dr. [name] questioned if still needed, was started by Dr. Caryl Comes remotely, to help encourage regular rhythm.  Continue warfarin INR therapeutic 4/9 at 2.0, will obtain today 4/19, again on 4/25, again on 4/30 and if all therapeutic will purse cardioversion on that day. Pt defers a TEE F/u's with [facility] clinic, in the near futrue, will discuss if another ablation in needed Bmet/mag, cbc, INR today   Butch Penny C. Magdalene Tardiff, [facility] [address] [place], [place] [address] [phone]

## 2017-06-23 NOTE — Patient Instructions (Addendum)
Stop Ranexa  Cardioversion scheduled for Tuesday, April 30th  - Go to the coumadin clinic prior to arrival   - Arrive at the [facility] and go to admitting at 11:30AM  -Do not eat or drink anything after midnight the night prior to your procedure.  - Take all your medication with a sip of water prior to arrival.  - You will not be able to drive home after your procedure.

## 2017-06-29 ENCOUNTER — Ambulatory Visit (INDEPENDENT_AMBULATORY_CARE_PROVIDER_SITE_OTHER): Payer: Medicare Other | Admitting: *Deleted

## 2017-06-29 ENCOUNTER — Encounter: Payer: Self-pay | Admitting: Internal Medicine

## 2017-06-29 DIAGNOSIS — I482 Chronic atrial fibrillation, unspecified: Secondary | ICD-10-CM

## 2017-06-29 DIAGNOSIS — Z5181 Encounter for therapeutic drug level monitoring: Secondary | ICD-10-CM

## 2017-06-29 DIAGNOSIS — I4892 Unspecified atrial flutter: Secondary | ICD-10-CM

## 2017-06-29 LAB — POCT INR: INR: 2.3

## 2017-06-29 NOTE — Patient Instructions (Signed)
Description   Today take 1.5 tablet, then change your dose to  1 tablet daily except 1.5 tablets on Mondays, Wednesdays  and Fridays. Recheck INR in 1 week.

## 2017-07-04 ENCOUNTER — Ambulatory Visit (HOSPITAL_COMMUNITY): Admission: RE | Admit: 2017-07-04 | Payer: Medicare Other | Source: Ambulatory Visit | Admitting: Cardiovascular Disease

## 2017-07-04 ENCOUNTER — Encounter (HOSPITAL_COMMUNITY): Admission: RE | Payer: Self-pay | Source: Ambulatory Visit

## 2017-07-04 SURGERY — CARDIOVERSION
Anesthesia: General

## 2017-07-06 ENCOUNTER — Ambulatory Visit (INDEPENDENT_AMBULATORY_CARE_PROVIDER_SITE_OTHER): Payer: Medicare Other | Admitting: *Deleted

## 2017-07-06 DIAGNOSIS — I482 Chronic atrial fibrillation, unspecified: Secondary | ICD-10-CM

## 2017-07-06 DIAGNOSIS — I4892 Unspecified atrial flutter: Secondary | ICD-10-CM | POA: Diagnosis not present

## 2017-07-06 DIAGNOSIS — Z5181 Encounter for therapeutic drug level monitoring: Secondary | ICD-10-CM | POA: Diagnosis not present

## 2017-07-06 LAB — POCT INR: INR: 2.6

## 2017-07-06 NOTE — Patient Instructions (Signed)
Description   Continue taking 1 tablet daily except 1.5 tablets on Mondays, Wednesdays  and Fridays. Recheck INR in 1 week.

## 2017-07-07 ENCOUNTER — Telehealth (HOSPITAL_COMMUNITY): Payer: Self-pay | Admitting: *Deleted

## 2017-07-07 ENCOUNTER — Other Ambulatory Visit (HOSPITAL_COMMUNITY): Payer: Self-pay | Admitting: *Deleted

## 2017-07-07 DIAGNOSIS — I4819 Other persistent atrial fibrillation: Secondary | ICD-10-CM

## 2017-07-07 NOTE — Telephone Encounter (Signed)
Cardioversion is set up for 5/17 with Dr. [name] per pt request after 4 weekly INR checks. He will have INR check morning of procedure in the coumadin clinic. He will have his labs drawn at his appt with Dr. Rayann Heman on 5/13. NPO after MN. Meds with sip of water. No driving day of procedure reviewed and verbalized understanding. Dr. Rayann Heman to direct follow up appointment after cardioversion. Pt states he understands.

## 2017-07-14 ENCOUNTER — Ambulatory Visit (INDEPENDENT_AMBULATORY_CARE_PROVIDER_SITE_OTHER): Payer: Medicare Other | Admitting: *Deleted

## 2017-07-14 DIAGNOSIS — I4892 Unspecified atrial flutter: Secondary | ICD-10-CM | POA: Diagnosis not present

## 2017-07-14 DIAGNOSIS — Z5181 Encounter for therapeutic drug level monitoring: Secondary | ICD-10-CM | POA: Diagnosis not present

## 2017-07-14 DIAGNOSIS — I482 Chronic atrial fibrillation, unspecified: Secondary | ICD-10-CM

## 2017-07-14 LAB — POCT INR: INR: 2.7

## 2017-07-14 NOTE — Patient Instructions (Signed)
Description   Continue taking 1 tablet daily except 1.5 tablets on Mondays, Wednesdays  and Fridays. Recheck INR in 1 week.

## 2017-07-17 ENCOUNTER — Ambulatory Visit (INDEPENDENT_AMBULATORY_CARE_PROVIDER_SITE_OTHER): Payer: Medicare Other | Admitting: Internal Medicine

## 2017-07-17 ENCOUNTER — Encounter: Payer: Self-pay | Admitting: Internal Medicine

## 2017-07-17 VITALS — BP 132/72 | HR 68 | Ht 68.0 in | Wt 200.0 lb

## 2017-07-17 DIAGNOSIS — I484 Atypical atrial flutter: Secondary | ICD-10-CM | POA: Diagnosis not present

## 2017-07-17 DIAGNOSIS — I481 Persistent atrial fibrillation: Secondary | ICD-10-CM | POA: Diagnosis not present

## 2017-07-17 DIAGNOSIS — I4819 Other persistent atrial fibrillation: Secondary | ICD-10-CM

## 2017-07-17 NOTE — Patient Instructions (Addendum)
Medication Instructions:  Your physician recommends that you continue on your current medications as directed. Please refer to the Current Medication list given to you today.  Labwork: None ordered.  Testing/Procedures: None ordered.  Follow-Up: Your physician wants you to follow-up in: as needed with Dr. Allred.       Any Other Special Instructions Will Be Listed Below (If Applicable).  If you need a refill on your cardiac medications before your next appointment, please call your pharmacy.   

## 2017-07-17 NOTE — Progress Notes (Signed)
Electrophysiology Office Note   Date:  07/17/2017   ID:  [name] [name], [name] [name] Jun 08, 1940, MRN [id number]  PCP:  [name] Lima, MD  Cardiologist:  Dr [name] Primary Electrophysiologist: Dr Boyd Kerbs ([facility])  CC: afib   History of Present Illness: [name] [name] is a 77 y.o. male who presents today for electrophysiology evaluation.   He was previously seen by me 06/06/17 for initiation of tikosyn (my note reviewed).  He has a h/o rheumatic fever.  He has a bioprosthetic AVR and MVR at [facility] as well as MAZE in 2006.  He has also had prior CVA and a 5.0 cm ascending aortic aneurysm.  He has had both afib and atrial flutter post MAZE and was failed medical therapy with multaq and ranolazine.  He underwent atrial flutter ablation at [facility] 11/15.  He has continued to have recurrent atrial arrhythmias since that time.  He is followed by Dr [name].  Recently he was admitted 06/06/17 for initiation of tikosyn.  Upon follow-up in the AF clinic 06/23/17, he was noted to be back in atypical atrial flutter.  He is now seen in EP clinic and has converted to sinus rhythm about 36 hours ago.  He feels well at this time.  Today, he denies symptoms of palpitations, chest pain, shortness of breath, orthopnea, PND, lower extremity edema, claudication, dizziness, presyncope, syncope, bleeding, or neurologic sequela. The patient is tolerating medications without difficulties and is otherwise without complaint today.    Past Medical History:  Diagnosis Date  . Acute rheumatic heart disease, unspecified    childhood, age  8 & 38  . Acute rheumatic pericarditis   . Atrial fibrillation ([place])    history  . Diverticulosis   . External hemorrhoids without mention of complication   . 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 Lesion of ulnar nerve   . 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 Other and unspecified hyperlipidemia   . Previous back surgery 1978, jan 2007  . Psychosexual dysfunction with  inhibited sexual excitement   . SOB (shortness of breath)    "with heavy exercise"  . Stroke [facility])    Past Surgical History:  Procedure Laterality Date  . AORTIC AND MITRAL VALVE REPLACEMENT     09/2004  . CARDIOVERSION     3 times from 2004-2006  . CARDIOVERSION N/A 09/26/2013   Procedure: CARDIOVERSION;  Surgeon: Larey Dresser, MD;  Location: [place];  Service: Cardiovascular;  Laterality: N/A;  . CARDIOVERSION N/A 06/19/2014   Procedure: CARDIOVERSION;  Surgeon: Jerline Pain, MD;  Location: [facility] ENDOSCOPY;  Service: Cardiovascular;  Laterality: N/A;  . CARDIOVERSION N/A 04/24/2017   Procedure: CARDIOVERSION;  Surgeon: Larey Dresser, MD;  Location: [facility] ENDOSCOPY;  Service: Cardiovascular;  Laterality: N/A;  . CARDIOVERSION N/A 06/08/2017   Procedure: CARDIOVERSION;  Surgeon: Pixie Casino, MD;  Location[facility] ENDOSCOPY;  Service: Cardiovascular;  Laterality: N/A;  . COLONOSCOPY    . laminectomies     10/1975 and in 03/2005  . TONSILLECTOMY     1950     Current Outpatient Medications  Medication Sig Dispense Refill  . atorvastatin (LIPITOR) 80 MG tablet Take 1 tablet by mouth  daily (Patient taking differently: Take 80 mg by mouth every evening. ) 90 tablet 3  . clobetasol cream (TEMOVATE) 8.09 % Apply 1 application topically daily as needed (skin irritation).     . Coenzyme Q10 (CO Q-10) 300  MG CAPS Take 300 mg by mouth daily.    . Cyanocobalamin (VITAMIN B-12) 5000 MCG SUBL Place 5,000 mcg under the tongue daily.    [name] docusate sodium (COLACE) 100 MG capsule Take 100 mg by mouth daily.    [name] dofetilide (TIKOSYN) 500 MCG capsule Take 1 capsule (500 mcg total) by mouth 2 (two) times daily. 14 capsule 1  . fish oil-omega-3 fatty acids 1000 MG capsule Take 1 g by mouth 2 (two) times daily.     . Glucosamine-Chondroitin (COSAMIN DS PO) Take 1 tablet by mouth 2 (two) times daily.    . hydrocortisone (ANUSOL-HC) 2.5 % rectal cream Place 1 application rectally 2 (two) times daily.  30 g 0  . loratadine (CLARITIN) 10 MG tablet Take 10 mg by mouth daily as needed for allergies.     [name] losartan (COZAAR) 25 MG tablet Take 1 tablet (25 mg total) by mouth daily. 90 tablet 3  . metoprolol succinate (TOPROL-XL) 50 MG 24 hr tablet Take 75 mg by mouth 2 (two) times daily. Take with or immediately following a meal.     . Multiple Vitamins-Minerals (MULTIVITAL-M) TABS Take by mouth.    . Probiotic Product (PROBIOTIC FORMULA PO) Take 1 capsule by mouth 2 (two) times daily.     . sildenafil (VIAGRA) 100 MG tablet Take 1 tablet (100 mg total) by mouth as needed for erectile dysfunction. 10 tablet 6  . warfarin (COUMADIN) 5 MG tablet Take as directed by Coumadin Clinic (Patient taking differently: Take 5-7.5 mg by mouth See admin instructions. Take 7.5 mg by mouth daily on Monday & Friday. Take 5 mg by mouth daily on all other days) 120 tablet 1   No current facility-administered medications for this visit.     Allergies:   Naproxen   Social History:  The patient  reports that he has never smoked. He has never used smokeless tobacco. He reports that he drinks about 1.8 oz of alcohol per week. He reports that he does not use drugs.   Family History:  The patient's  family history includes Cancer in his father; Diabetes in his brother and paternal aunt; Heart attack in his maternal grandfather; Macular degeneration in his mother.    ROS:  Please see the history of present illness.   All other systems are personally reviewed and negative.    PHYSICAL EXAM: VS:  BP 132/72   Pulse 68   Ht 5\' 8"  (1.727 m)   Wt 200 lb (90.7 kg)   BMI 30.41 kg/m  , BMI Body mass index is 30.41 kg/m. GEN: Well nourished, well developed, in no acute distress  HEENT: normal  Neck: no JVD, carotid bruits, or masses Cardiac: RRR; no murmurs, rubs, or gallops,no edema  Respiratory:  clear to auscultation bilaterally, normal work of breathing GI: soft, nontender, nondistended, + BS MS: no deformity or  atrophy  Skin: warm and dry  Neuro:  Strength and sensation are intact Psych: euthymic mood, full affect  EKG:  EKG is ordered today. The ekg ordered today is personally reviewed and shows sinus rhythm 68 bpm, PR 268 msec, QRS 110 msec, QTc 457 msec   Recent Labs: 05/22/2017: TSH 2.82 06/23/2017: BUN 21; Creatinine, Ser 1.04; Hemoglobin 14.0; Magnesium 2.1; Platelets 254; Potassium 4.3; Sodium 141  personally reviewed   Lipid Panel     Component Value Date/Time   CHOL 110 04/28/2017 1203   CHOL 102 11/26/2013   TRIG 97 04/28/2017 1203   TRIG 93  11/26/2013   HDL 32 (L) 04/28/2017 1203   CHOLHDL 3.4 04/28/2017 1203   VLDL 19 04/28/2017 1203   LDLCALC 59 04/28/2017 1203   LDLCALC 58 11/26/2013   personally reviewed   Wt Readings from Last 3 Encounters:  07/17/17 200 lb (90.7 kg)  06/23/17 198 lb 9.6 oz (90.1 kg)  06/13/17 199 lb 12 oz (90.6 kg)    06/23/17 labs are reviewed  Other studies personally reviewed: Additional studies/ records that were reviewed today include: my recent hospital notes, AF clinic records Review of the above records today demonstrates: as above  ASSESSMENT AND PLAN:  1.  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 S/p prior MAZE and subsequent atrial flutter ablation at [facility].  He has failed medical therapy with multaq and ranolazine and recently was admitted for tikosyn load.  On coumadin for stroke prevention. He has converted back to sinus within the past 36 hours.  I will therefore cancel cardioversion for Friday. He has scheduled follow with Dr Boyd Kerbs at [facility] in July.  He will need bmet, mg at that time.  They will have discussions about consideration of additional ablation vs continued tikosyn vs amiodarone. No changes are made today   Follow-up:  He wishes to see me as needed.  He will follow-up in AF clinic with any problems.  Current medicines are reviewed at length with the patient  today.   The patient does not have concerns regarding his medicines.  The following changes were made today:  none  Labs/ tests ordered today include:  Orders Placed This Encounter  Procedures  . EKG 12-Lead     Signed, Thompson Grayer, MD  07/17/2017 9:11 AM     [facility] [address] [place] [address] [phone] (office) [phone] (fax)

## 2017-07-19 ENCOUNTER — Telehealth (HOSPITAL_COMMUNITY): Payer: Self-pay | Admitting: *Deleted

## 2017-07-19 NOTE — Telephone Encounter (Signed)
   Pt sent this ekg from his Jodelle Red - states the rhythm started last PM. He feels ok just tired. Discussed with Roderic Palau NP - - she recommends trying to move up appointment with [facility] with Dr. Boyd Kerbs. Pt in agreement with plan as he does not want to do cardioversion since he is going in and out frequently. Pt asked that I forward recent office notes to [place] clinic for upcoming visit as well. He will monitor his HR to ensure rate control.

## 2017-07-21 ENCOUNTER — Ambulatory Visit (HOSPITAL_COMMUNITY): Admission: RE | Admit: 2017-07-21 | Payer: Medicare Other | Source: Ambulatory Visit | Admitting: Cardiology

## 2017-07-21 ENCOUNTER — Encounter (HOSPITAL_COMMUNITY): Admission: RE | Payer: Self-pay | Source: Ambulatory Visit

## 2017-07-21 SURGERY — CARDIOVERSION
Anesthesia: General

## 2017-07-26 DIAGNOSIS — H5213 Myopia, bilateral: Secondary | ICD-10-CM | POA: Diagnosis not present

## 2017-07-26 DIAGNOSIS — H25813 Combined forms of age-related cataract, bilateral: Secondary | ICD-10-CM | POA: Diagnosis not present

## 2017-07-26 DIAGNOSIS — H52223 Regular astigmatism, bilateral: Secondary | ICD-10-CM | POA: Diagnosis not present

## 2017-07-26 DIAGNOSIS — H40013 Open angle with borderline findings, low risk, bilateral: Secondary | ICD-10-CM | POA: Diagnosis not present

## 2017-08-03 ENCOUNTER — Ambulatory Visit (INDEPENDENT_AMBULATORY_CARE_PROVIDER_SITE_OTHER): Payer: Medicare Other | Admitting: *Deleted

## 2017-08-03 DIAGNOSIS — I4892 Unspecified atrial flutter: Secondary | ICD-10-CM

## 2017-08-03 DIAGNOSIS — Z5181 Encounter for therapeutic drug level monitoring: Secondary | ICD-10-CM

## 2017-08-03 DIAGNOSIS — I482 Chronic atrial fibrillation, unspecified: Secondary | ICD-10-CM

## 2017-08-03 LAB — POCT INR: INR: 2.3 (ref 2.0–3.0)

## 2017-08-03 NOTE — Patient Instructions (Signed)
Description   Continue taking 1 tablet daily except 1.5 tablets on Mondays, Wednesdays  and Fridays. Recheck INR in 3 weeks  Is going to [place] clinic and will call and let us know if scheduled for any procedures [phone]

## 2017-08-10 ENCOUNTER — Telehealth (HOSPITAL_COMMUNITY): Payer: Self-pay | Admitting: *Deleted

## 2017-08-10 DIAGNOSIS — I4430 Unspecified atrioventricular block: Secondary | ICD-10-CM | POA: Diagnosis not present

## 2017-08-10 DIAGNOSIS — I712 Thoracic aortic aneurysm, without rupture: Secondary | ICD-10-CM | POA: Diagnosis not present

## 2017-08-10 DIAGNOSIS — I4892 Unspecified atrial flutter: Secondary | ICD-10-CM | POA: Diagnosis not present

## 2017-08-10 DIAGNOSIS — I052 Rheumatic mitral stenosis with insufficiency: Secondary | ICD-10-CM | POA: Diagnosis not present

## 2017-08-10 DIAGNOSIS — R9431 Abnormal electrocardiogram [ECG] [EKG]: Secondary | ICD-10-CM | POA: Diagnosis not present

## 2017-08-10 DIAGNOSIS — I061 Rheumatic aortic insufficiency: Secondary | ICD-10-CM | POA: Diagnosis not present

## 2017-08-10 DIAGNOSIS — I484 Atypical atrial flutter: Secondary | ICD-10-CM | POA: Diagnosis not present

## 2017-08-10 DIAGNOSIS — Z8673 Personal history of transient ischemic attack (TIA), and cerebral infarction without residual deficits: Secondary | ICD-10-CM | POA: Diagnosis not present

## 2017-08-10 DIAGNOSIS — I480 Paroxysmal atrial fibrillation: Secondary | ICD-10-CM | POA: Diagnosis not present

## 2017-08-10 NOTE — Telephone Encounter (Signed)
Patient called in stating he saw EP at the [facility] clinic per recommendations. Dr Boyd Kerbs at [facility] clinic is recommending to try another cardioversion with plan for ablation down the road. Pt would like to have cardioversion in [place] -  Pt has been having weekly INRs. Told pt I would discuss with Dr. Rayann Heman and set up accordingly.

## 2017-08-10 NOTE — Telephone Encounter (Signed)
We probably need to get him in to do an H&P prior to his cardioversion.  Im happy to see on Monday if you want.  Shouldn't take long.

## 2017-08-11 ENCOUNTER — Telehealth: Payer: Self-pay | Admitting: *Deleted

## 2017-08-11 ENCOUNTER — Other Ambulatory Visit (HOSPITAL_COMMUNITY): Payer: Self-pay | Admitting: *Deleted

## 2017-08-11 DIAGNOSIS — I712 Thoracic aortic aneurysm, without rupture: Secondary | ICD-10-CM | POA: Diagnosis not present

## 2017-08-11 DIAGNOSIS — I061 Rheumatic aortic insufficiency: Secondary | ICD-10-CM | POA: Diagnosis not present

## 2017-08-11 DIAGNOSIS — I359 Nonrheumatic aortic valve disorder, unspecified: Secondary | ICD-10-CM | POA: Diagnosis not present

## 2017-08-11 DIAGNOSIS — I052 Rheumatic mitral stenosis with insufficiency: Secondary | ICD-10-CM | POA: Diagnosis not present

## 2017-08-11 DIAGNOSIS — I484 Atypical atrial flutter: Secondary | ICD-10-CM | POA: Diagnosis not present

## 2017-08-11 DIAGNOSIS — Z952 Presence of prosthetic heart valve: Secondary | ICD-10-CM | POA: Diagnosis not present

## 2017-08-11 DIAGNOSIS — I361 Nonrheumatic tricuspid (valve) insufficiency: Secondary | ICD-10-CM | POA: Diagnosis not present

## 2017-08-11 NOTE — Telephone Encounter (Signed)
After receiving message from [name] [name] called patient to move appt from 08/23/17 to 08/24/17 as they would like an INR the day of DCCV; however had to leave a message to tell pt to call to Anticoagulation [name] regarding this. Pt needs appt rescheduled to 08/24/17 prior to 11am appt with AFIB Clinc and Procedure.

## 2017-08-11 NOTE — Telephone Encounter (Signed)
Per pt request scheduled for 6/20 - H&P visit prior to procedure. Pt has been having weekly INRs. Pt in agreement. NPO after MN, no driving home after procedure.

## 2017-08-11 NOTE — Telephone Encounter (Signed)
-----   Message from Juluis Mire, RN sent at 08/11/2017 10:16 AM EDT ----- Regarding: inr morning of dccv Pt is scheduled for dccv on 6/20 - will need inr prior to arrival at [place] clinic at 29. Please help arrange.  Thank you! Stacy RN AFib Clinic

## 2017-08-14 NOTE — Telephone Encounter (Signed)
Spoke with pt and scheduled INR check for June 20th prior to cardioversion

## 2017-08-16 ENCOUNTER — Ambulatory Visit (INDEPENDENT_AMBULATORY_CARE_PROVIDER_SITE_OTHER): Payer: Medicare Other | Admitting: *Deleted

## 2017-08-16 DIAGNOSIS — Z5181 Encounter for therapeutic drug level monitoring: Secondary | ICD-10-CM | POA: Diagnosis not present

## 2017-08-16 DIAGNOSIS — I4892 Unspecified atrial flutter: Secondary | ICD-10-CM

## 2017-08-16 DIAGNOSIS — I482 Chronic atrial fibrillation, unspecified: Secondary | ICD-10-CM

## 2017-08-16 LAB — POCT INR: INR: 3.6 — AB (ref 2.0–3.0)

## 2017-08-16 NOTE — Patient Instructions (Signed)
Description   Today only take 1/2 tablet, then Continue taking 1 tablet daily except 1.5 tablets on Mondays, Wednesdays  and Fridays. Recheck INR in 1 week.  Call and let us know if scheduled for any procedures [phone] or medication changes.

## 2017-08-24 ENCOUNTER — Encounter (HOSPITAL_COMMUNITY)
Admission: RE | Disposition: A | Discharge status: Home / Self Care | Payer: Self-pay | Source: Ambulatory Visit | Attending: Cardiovascular Disease

## 2017-08-24 ENCOUNTER — Encounter (HOSPITAL_COMMUNITY): Payer: Self-pay | Admitting: Certified Registered Nurse Anesthetist

## 2017-08-24 ENCOUNTER — Other Ambulatory Visit: Payer: Self-pay

## 2017-08-24 ENCOUNTER — Ambulatory Visit (HOSPITAL_BASED_OUTPATIENT_CLINIC_OR_DEPARTMENT_OTHER)
Admission: RE | Admit: 2017-08-24 | Discharge: 2017-08-24 | Disposition: A | Discharge status: Home / Self Care | Payer: Medicare Other | Source: Ambulatory Visit | Attending: Nurse Practitioner | Admitting: Nurse Practitioner

## 2017-08-24 ENCOUNTER — Ambulatory Visit (HOSPITAL_COMMUNITY)
Admission: RE | Admit: 2017-08-24 | Discharge: 2017-08-24 | Disposition: A | Discharge status: Home / Self Care | Payer: Medicare Other | Source: Ambulatory Visit | Attending: Cardiovascular Disease | Admitting: Cardiovascular Disease

## 2017-08-24 ENCOUNTER — Ambulatory Visit (HOSPITAL_COMMUNITY): Payer: Medicare Other | Admitting: Certified Registered Nurse Anesthetist

## 2017-08-24 ENCOUNTER — Ambulatory Visit (INDEPENDENT_AMBULATORY_CARE_PROVIDER_SITE_OTHER): Payer: Medicare Other

## 2017-08-24 ENCOUNTER — Encounter (HOSPITAL_COMMUNITY): Payer: Self-pay | Admitting: Nurse Practitioner

## 2017-08-24 ENCOUNTER — Encounter (HOSPITAL_COMMUNITY): Payer: Self-pay | Admitting: Cardiovascular Disease

## 2017-08-24 VITALS — BP 142/84 | HR 72 | Ht 68.0 in | Wt 200.0 lb

## 2017-08-24 DIAGNOSIS — I482 Chronic atrial fibrillation, unspecified: Secondary | ICD-10-CM

## 2017-08-24 DIAGNOSIS — I059 Rheumatic mitral valve disease, unspecified: Secondary | ICD-10-CM | POA: Diagnosis not present

## 2017-08-24 DIAGNOSIS — I4892 Unspecified atrial flutter: Secondary | ICD-10-CM | POA: Diagnosis not present

## 2017-08-24 DIAGNOSIS — I10 Essential (primary) hypertension: Secondary | ICD-10-CM | POA: Insufficient documentation

## 2017-08-24 DIAGNOSIS — Z8673 Personal history of transient ischemic attack (TIA), and cerebral infarction without residual deficits: Secondary | ICD-10-CM | POA: Insufficient documentation

## 2017-08-24 DIAGNOSIS — I481 Persistent atrial fibrillation: Secondary | ICD-10-CM

## 2017-08-24 DIAGNOSIS — E785 Hyperlipidemia, unspecified: Secondary | ICD-10-CM | POA: Insufficient documentation

## 2017-08-24 DIAGNOSIS — Z952 Presence of prosthetic heart valve: Secondary | ICD-10-CM | POA: Insufficient documentation

## 2017-08-24 DIAGNOSIS — Z7901 Long term (current) use of anticoagulants: Secondary | ICD-10-CM | POA: Diagnosis not present

## 2017-08-24 DIAGNOSIS — Z5181 Encounter for therapeutic drug level monitoring: Secondary | ICD-10-CM

## 2017-08-24 DIAGNOSIS — I719 Aortic aneurysm of unspecified site, without rupture: Secondary | ICD-10-CM | POA: Insufficient documentation

## 2017-08-24 DIAGNOSIS — I484 Atypical atrial flutter: Secondary | ICD-10-CM | POA: Diagnosis not present

## 2017-08-24 DIAGNOSIS — I4819 Other persistent atrial fibrillation: Secondary | ICD-10-CM

## 2017-08-24 HISTORY — PX: CARDIOVERSION: SHX1299

## 2017-08-24 LAB — BASIC METABOLIC PANEL
Anion gap: 8 (ref 5–15)
BUN: 21 mg/dL — AB (ref 6–20)
CHLORIDE: 111 mmol/L (ref 101–111)
CO2: 23 mmol/L (ref 22–32)
Calcium: 9 mg/dL (ref 8.9–10.3)
Creatinine, Ser: 0.94 mg/dL (ref 0.61–1.24)
GFR calc Af Amer: >60 mL/min (ref >60)
GFR calc non Af Amer: >60 mL/min (ref >60)
GLUCOSE: 119 mg/dL — AB (ref 65–99)
Potassium: 4.4 mmol/L (ref 3.5–5.1)
Sodium: 142 mmol/L (ref 135–145)

## 2017-08-24 LAB — CBC
HEMATOCRIT: 42.9 % (ref 39.0–52.0)
HEMOGLOBIN: 14 g/dL (ref 13.0–17.0)
MCH: 31 pg (ref 26.0–34.0)
MCHC: 32.6 g/dL (ref 30.0–36.0)
MCV: 94.9 fL (ref 78.0–100.0)
Platelets: 225 10*3/uL (ref 150–400)
RBC: 4.52 MIL/uL (ref 4.22–5.81)
RDW: 13.3 % (ref 11.5–15.5)
WBC: 10.1 10*3/uL (ref 4.0–10.5)

## 2017-08-24 LAB — POCT INR: INR: 2.3 (ref 2.0–3.0)

## 2017-08-24 SURGERY — CARDIOVERSION
Anesthesia: General

## 2017-08-24 MED ORDER — LIDOCAINE 2% (20 MG/ML) 5 ML SYRINGE
INTRAMUSCULAR | Status: DC | PRN
  Administered 2017-08-24: 100 mg via INTRAVENOUS

## 2017-08-24 MED ORDER — PROPOFOL 10 MG/ML IV BOLUS
INTRAVENOUS | Status: DC | PRN
  Administered 2017-08-24: 45 mg via INTRAVENOUS

## 2017-08-24 MED ORDER — SODIUM CHLORIDE 0.9 % IV SOLN
INTRAVENOUS | Status: DC
  Administered 2017-08-24: 500 mL via INTRAVENOUS

## 2017-08-24 NOTE — CV Procedure (Signed)
Electrical Cardioversion Procedure Note [name] [name] [id number] 1941/01/29  Procedure: Electrical Cardioversion Indications:  Atrial Flutter  Procedure Details Consent: Risks of procedure as well as the alternatives and risks of each were explained to the (patient/caregiver).  Consent for procedure obtained. Time Out: Verified patient identification, verified procedure, site/side was marked, verified correct patient position, special equipment/implants available, medications/allergies/relevent history reviewed, required imaging and test results available.  Performed  Patient placed on cardiac monitor, pulse oximetry, supplemental oxygen as necessary.  Sedation given: propofol 40mg , lidocaine 100 mg Pacer pads placed anterior and posterior chest.  Cardioverted 1 time(s).  Cardioverted at 150J.  Evaluation Findings: Post procedure EKG shows: sinus bradycardia Complications: None Patient did tolerate procedure well.   Skeet Latch, MD 08/24/2017, 12:39 PM

## 2017-08-24 NOTE — H&P (View-Only) (Signed)
Primary Care Physician: [name], MD Primary Cardiologist: Dr Laurann Montana ([facility]) Primary Electrophysiologist: Dr Boyd Kerbs ([facility])  [name] [name] is a 77 y.o. male with a history of persistent atrial fibrillation, atypical atrial flutter, aortic aneurysm, valvular heart disease with prior surgery with MAZE at [facility] 2006 and subsequent ablation at [facility] 2015 who presents for follow up in the [facility].  Since last being seen in clinic, the patient reports doing reasonably well.  He was recently seen at [facility] by Dr Laurann Montana (cardiology) and Dr Boyd Kerbs (EP) (notes personally reviewed).  He has aortic enlargement as well as worsening valvular disease.  There have been discussions about additional aortic root repair with additional AVR/MVR.  He is also being considered for repeat complex left atrial ablation.  Ultimately, the decision was made to proceed with cardioversion here and then follow-up for further discussions at [facility] in September.  He presents today for follow-up and to arrange cardioversion.  Today, he denies symptoms of palpitations, chest pain, shortness of breath, orthopnea, PND, lower extremity edema, dizziness, presyncope, syncope, snoring, daytime somnolence, bleeding, or neurologic sequela. The patient is tolerating medications without difficulties and is otherwise without complaint today.    Atrial Fibrillation Risk Factors:  he does not have symptoms or diagnosis of sleep apnea.  he does have a history of rheumatic fever.  he does not have a history of alcohol use.  he has a BMI of Body mass index is 30.41 kg/m.[name] Filed Weights   08/24/17 1103  Weight: 200 lb (90.7 kg)    LA size: 58mm!   Atrial Fibrillation Management history:  Previous antiarrhythmic drugs: multaq, ranolazine, tikosyn  Previous cardioversions: multiple  Previous ablations: MAZE in 2006, AF ablation at [facility] 2015  CHADS2VASC score: 4  Anticoagulation history: coumadin   Past  Medical History:  Diagnosis Date  . Acute rheumatic heart disease, unspecified    childhood, age  36 & 60  . Acute rheumatic pericarditis   . Atrial fibrillation ([place])    history  . Diverticulosis   . External hemorrhoids without mention of complication   . 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 Lesion of ulnar nerve   . 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 Other and unspecified hyperlipidemia   . Previous back surgery 1978, jan 2007  . Psychosexual dysfunction with inhibited sexual excitement   . SOB (shortness of breath)    "with heavy exercise"  . Stroke [facility])    Past Surgical History:  Procedure Laterality Date  . AORTIC AND MITRAL VALVE REPLACEMENT     09/2004  . CARDIOVERSION     3 times from 2004-2006  . CARDIOVERSION N/A 09/26/2013   Procedure: CARDIOVERSION;  Surgeon: Larey Dresser, MD;  Location: [place];  Service: Cardiovascular;  Laterality: N/A;  . CARDIOVERSION N/A 06/19/2014   Procedure: CARDIOVERSION;  Surgeon: Jerline Pain, MD;  Location: [facility] ENDOSCOPY;  Service: Cardiovascular;  Laterality: N/A;  . CARDIOVERSION N/A 04/24/2017   Procedure: CARDIOVERSION;  Surgeon: Larey Dresser, MD;  Location: [facility] ENDOSCOPY;  Service: Cardiovascular;  Laterality: N/A;  . CARDIOVERSION N/A 06/08/2017   Procedure: CARDIOVERSION;  Surgeon: Pixie Casino, MD;  Location: [facility] ENDOSCOPY;  Service: Cardiovascular;  Laterality: N/A;  . COLONOSCOPY    . laminectomies     10/1975 and in 03/2005  . TONSILLECTOMY     1950    Current Outpatient Medications  Medication Sig Dispense  Refill  . atorvastatin (LIPITOR) 80 MG tablet Take 1 tablet by mouth  daily 90 tablet 3  . clobetasol cream (TEMOVATE) 4.00 % Apply 1 application topically daily as needed (skin irritation).     . Coenzyme Q10 (CO Q-10) 300 MG CAPS Take 300 mg by mouth daily.    . Cyanocobalamin (VITAMIN B-12) 5000 MCG SUBL Place 5,000 mcg under the tongue daily.    [name] docusate sodium (COLACE) 100  MG capsule Take 100 mg by mouth daily.    [name] dofetilide (TIKOSYN) 500 MCG capsule Take 1 capsule (500 mcg total) by mouth 2 (two) times daily. 14 capsule 1  . fish oil-omega-3 fatty acids 1000 MG capsule Take 1 g by mouth 2 (two) times daily.     . Glucosamine-Chondroitin (COSAMIN DS PO) Take 1 tablet by mouth 2 (two) times daily.    . hydrocortisone (ANUSOL-HC) 2.5 % rectal cream Place 1 application rectally 2 (two) times daily. (Patient taking differently: Place 1 application rectally 2 (two) times daily as needed for hemorrhoids. ) 30 g 0  . loratadine (CLARITIN) 10 MG tablet Take 10 mg by mouth daily as needed for allergies.     [name] losartan (COZAAR) 25 MG tablet Take 1 tablet (25 mg total) by mouth daily. 90 tablet 3  . metoprolol succinate (TOPROL-XL) 50 MG 24 hr tablet Take 75 mg by mouth 2 (two) times daily. Take with or immediately following a meal.     . Multiple Vitamins-Minerals (MULTIVITAL-M) TABS Take by mouth.    . Probiotic Product (PROBIOTIC FORMULA PO) Take 1 capsule by mouth 2 (two) times daily.     . sildenafil (VIAGRA) 100 MG tablet Take 1 tablet (100 mg total) by mouth as needed for erectile dysfunction. 10 tablet 6  . warfarin (COUMADIN) 5 MG tablet Take as directed by Coumadin Clinic (Patient taking differently: Take 5-7.5 mg by mouth See admin instructions. Take 7.5 mg by mouth daily on Monday, Wednesday and Friday. Take 5 mg by mouth daily on all other days) 120 tablet 1   No current facility-administered medications for this encounter.     Allergies  Allergen Reactions  . Naproxen Hives and Other (See Comments)    Aleve    Social History   Socioeconomic History  . Marital status: Married    Spouse name: Not on file  . Number of children: 2  . Years of education: BS  . Highest education level: Not on file  Occupational History  . Occupation: retired  Scientific laboratory technician  . Financial resource strain: Not on file  . Food insecurity:    Worry: Not on file     Inability: Not on file  . Transportation needs:    Medical: Not on file    Non-medical: Not on file  Tobacco Use  . Smoking status: Never Smoker  . Smokeless tobacco: Never Used  Substance and Sexual Activity  . Alcohol use: Yes    Alcohol/week: 1.8 oz    Types: 1 Glasses of wine, 1 Cans of beer, 1 Shots of liquor per week    Comment: wine occasionally  . Drug use: No  . Sexual activity: Yes    Partners: Female  Lifestyle  . Physical activity:    Days per week: Not on file    Minutes per session: Not on file  . Stress: Not on file  Relationships  . Social connections:    Talks on phone: Not on file    Gets together: Not on  file    Attends religious service: Not on file    Active member of club or organization: Not on file    Attends meetings of clubs or organizations: Not on file    Relationship status: Not on file  . Intimate partner violence:    Fear of current or ex partner: Not on file    Emotionally abused: Not on file    Physically abused: Not on file    Forced sexual activity: Not on file  Other Topics Concern  . Not on file  Social History Narrative   [name] - BS, [place]. married - '67. 2 sons - '74, '77  one son is a Nurse, learning disability for [employer]; 4 grandchildren. 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 marriage in good health. End-of-life: discussed issues and provided packet with the MOST form and out of facility order.    Family History  Problem Relation Age of Onset  . Macular degeneration Mother        macular degeneration  . Cancer Father        intestinal/GI  . Diabetes Paternal Aunt   . Heart attack Maternal Grandfather   . Diabetes Brother     ROS- All systems are reviewed and negative except as per the HPI above.  Physical Exam: Vitals:   08/24/17 1103  BP: (!) 142/84  Pulse: 72  Weight: 200 lb (90.7 kg)  Height: 5\' 8"  (1.727 m)    GEN- The patient is well  appearing, alert and oriented x 3 today.   Head- normocephalic, atraumatic Eyes-  Sclera clear, conjunctiva pink Ears- hearing intact Oropharynx- clear Neck- supple  Lungs- Clear to ausculation bilaterally, normal work of breathing Heart- irregular rate and rhythm,  GI- soft, NT, ND, + BS Extremities- no clubbing, cyanosis, or edema MS- no significant deformity or atrophy Skin- no rash or lesion Psych- euthymic mood, full affect Neuro- strength and sensation are intact  Wt Readings from Last 3 Encounters:  08/24/17 200 lb (90.7 kg)  07/17/17 200 lb (90.7 kg)  06/23/17 198 lb 9.6 oz (90.1 kg)    EKG today demonstrates afib, V rate 72 bpm  Epic records are reviewed at length today  Assessment and Plan:  1. persistent atrial fibrillation with atypical atrial flutter, severe LA enlargement, prior AVR/MVR and MAZE as well as AF ablation at [facility]. Notes from [facility] are reviewed.  Plan is for cardioversion at this time with follow-up studies at [facility] in September to consider aortic root repair, MVR and AVR. Risks and benefits of cardioversion were discussed at length today.  INRs are reviewed and have been therapeutic.  Continue tikosyn (QT stable) Continue coumadin for CHADS2VASC of 4  Return to AF clinic in 1 week Follow-up at [facility] in September as scheduled  Thompson Grayer, MD 08/24/2017 11:13 AM

## 2017-08-24 NOTE — Anesthesia Preprocedure Evaluation (Signed)
Anesthesia Evaluation  Patient identified by MRN, date of birth, ID band Patient awake    Reviewed: Allergy & Precautions, NPO status , Patient's Chart, lab work & pertinent test results  Airway Mallampati: II  TM Distance: >3 FB Neck ROM: Full    Dental no notable dental hx.    Pulmonary neg pulmonary ROS,    Pulmonary exam normal breath sounds clear to auscultation       Cardiovascular hypertension, Normal cardiovascular exam+ dysrhythmias Atrial Fibrillation  Rhythm:Regular Rate:Normal     Neuro/Psych CVA negative psych ROS   GI/Hepatic negative GI ROS, Neg liver ROS,   Endo/Other  negative endocrine ROS  Renal/GU negative Renal ROS  negative genitourinary   Musculoskeletal negative musculoskeletal ROS (+)   Abdominal   Peds negative pediatric ROS (+)  Hematology negative hematology ROS (+)   Anesthesia Other Findings   Reproductive/Obstetrics negative OB ROS                             Anesthesia Physical Anesthesia Plan  ASA: III  Anesthesia Plan: General   Post-op Pain Management:    Induction: Intravenous  PONV Risk Score and Plan: 0 and Treatment may vary due to age or medical condition  Airway Management Planned: Mask  Additional Equipment:   Intra-op Plan:   Post-operative Plan:   Informed Consent: I have reviewed the patients History and Physical, chart, labs and discussed the procedure including the risks, benefits and alternatives for the proposed anesthesia with the patient or authorized representative who has indicated his/her understanding and acceptance.   Dental advisory given  Plan Discussed with: CRNA  Anesthesia Plan Comments:         Anesthesia Quick Evaluation

## 2017-08-24 NOTE — Interval H&P Note (Signed)
History and Physical Interval Note:  08/24/2017 12:34 PM  [name] [name]  has presented today for surgery, with the diagnosis of AFLUTTER  The various methods of treatment have been discussed with the patient and family. After consideration of risks, benefits and other options for treatment, the patient has consented to  Procedure(s): CARDIOVERSION (N/A) as a surgical intervention .  The patient's history has been reviewed, patient examined, no change in status, stable for surgery.  I have reviewed the patient's chart and labs.  Questions were answered to the patient's satisfaction.     Skeet Latch, MD

## 2017-08-24 NOTE — Transfer of Care (Signed)
Immediate Anesthesia Transfer of Care Note  Patient: [name] [name]  Procedure(s) Performed: CARDIOVERSION (N/A )  Patient Location: Endoscopy Unit  Anesthesia Type:General  Level of Consciousness: drowsy and patient cooperative  Airway & Oxygen Therapy: Patient Spontanous Breathing  Post-op Assessment: Report given to RN and Post -op Vital signs reviewed and stable  Post vital signs: Reviewed and stable  Last Vitals:  Vitals Value Taken Time  BP 119/83 08/24/2017 12:39 PM  Temp    Pulse 56 08/24/2017 12:41 PM  Resp 23 08/24/2017 12:41 PM  SpO2 100 % 08/24/2017 12:41 PM    Last Pain:  Vitals:   08/24/17 1208  TempSrc: Oral  PainSc: 0-No pain         Complications: No apparent anesthesia complications

## 2017-08-24 NOTE — Anesthesia Postprocedure Evaluation (Signed)
Anesthesia Post Note  Patient: [name] [name]  Procedure(s) Performed: CARDIOVERSION (N/A )     Patient location during evaluation: Endoscopy Anesthesia Type: General Level of consciousness: awake and alert Pain management: pain level controlled Vital Signs Assessment: post-procedure vital signs reviewed and stable Respiratory status: spontaneous breathing, nonlabored ventilation, respiratory function stable and patient connected to nasal cannula oxygen Cardiovascular status: blood pressure returned to baseline and stable Postop Assessment: no apparent nausea or vomiting Anesthetic complications: no    Last Vitals:  Vitals:   08/24/17 1247 08/24/17 1255  BP: 100/68 108/75  Pulse: (!) 57 (!) 58  Resp: (!) 23 17  Temp: 36.4 C   SpO2: 97% 96%    Last Pain:  Vitals:   08/24/17 1255  TempSrc:   PainSc: 0-No pain                 Montez Hageman

## 2017-08-24 NOTE — Anesthesia Procedure Notes (Signed)
Procedure Name: General with mask airway Date/Time: 08/24/2017 12:36 PM Performed by: Colin Benton, CRNA Pre-anesthesia Checklist: Patient identified, Emergency Drugs available, Suction available, Patient being monitored and Timeout performed Patient Re-evaluated:Patient Re-evaluated prior to induction Oxygen Delivery Method: Ambu bag Preoxygenation: Pre-oxygenation with 100% oxygen Induction Type: IV induction Ventilation: Mask ventilation without difficulty

## 2017-08-24 NOTE — Progress Notes (Signed)
Primary Care Physician: [name], MD Primary Cardiologist: Dr Laurann Montana ([facility]) Primary Electrophysiologist: Dr Boyd Kerbs ([facility])  AHMAAD [name] is a 77 y.o. male with a history of persistent atrial fibrillation, atypical atrial flutter, aortic aneurysm, valvular heart disease with prior surgery with MAZE at [facility] 2006 and subsequent ablation at [facility] 2015 who presents for follow up in the [facility].  Since last being seen in clinic, the patient reports doing reasonably well.  He was recently seen at [facility] by Dr Laurann Montana (cardiology) and Dr Boyd Kerbs (EP) (notes personally reviewed).  He has aortic enlargement as well as worsening valvular disease.  There have been discussions about additional aortic root repair with additional AVR/MVR.  He is also being considered for repeat complex left atrial ablation.  Ultimately, the decision was made to proceed with cardioversion here and then follow-up for further discussions at [facility] in September.  He presents today for follow-up and to arrange cardioversion.  Today, he denies symptoms of palpitations, chest pain, shortness of breath, orthopnea, PND, lower extremity edema, dizziness, presyncope, syncope, snoring, daytime somnolence, bleeding, or neurologic sequela. The patient is tolerating medications without difficulties and is otherwise without complaint today.    Atrial Fibrillation Risk Factors:  he does not have symptoms or diagnosis of sleep apnea.  he does have a history of rheumatic fever.  he does not have a history of alcohol use.  he has a BMI of Body mass index is 30.41 kg/m.[name] Filed Weights   08/24/17 1103  Weight: 200 lb (90.7 kg)    LA size: 72mm!   Atrial Fibrillation Management history:  Previous antiarrhythmic drugs: multaq, ranolazine, tikosyn  Previous cardioversions: multiple  Previous ablations: MAZE in 2006, AF ablation at [facility] 2015  CHADS2VASC score: 4  Anticoagulation history: coumadin   Past  Medical History:  Diagnosis Date  . Acute rheumatic heart disease, unspecified    childhood, age  56 & 64  . Acute rheumatic pericarditis   . Atrial fibrillation ([place] )    history  . Diverticulosis   . External hemorrhoids without mention of complication   . 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 Lesion of ulnar nerve   . 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 Other and unspecified hyperlipidemia   . Previous back surgery 1978, jan 2007  . Psychosexual dysfunction with inhibited sexual excitement   . SOB (shortness of breath)    "with heavy exercise"  . Stroke [facility])    Past Surgical History:  Procedure Laterality Date  . AORTIC AND MITRAL VALVE REPLACEMENT     09/2004  . CARDIOVERSION     3 times from 2004-2006  . CARDIOVERSION N/A 09/26/2013   Procedure: CARDIOVERSION;  Surgeon: Larey Dresser, MD;  Location: [place];  Service: Cardiovascular;  Laterality: N/A;  . CARDIOVERSION N/A 06/19/2014   Procedure: CARDIOVERSION;  Surgeon: Jerline Pain, MD;  Location: [facility] ENDOSCOPY;  Service: Cardiovascular;  Laterality: N/A;  . CARDIOVERSION N/A 04/24/2017   Procedure: CARDIOVERSION;  Surgeon: Larey Dresser, MD;  Location: [facility] ENDOSCOPY;  Service: Cardiovascular;  Laterality: N/A;  . CARDIOVERSION N/A 06/08/2017   Procedure: CARDIOVERSION;  Surgeon: Pixie Casino, MD;  Location: [facility] ENDOSCOPY;  Service: Cardiovascular;  Laterality: N/A;  . COLONOSCOPY    . laminectomies     10/1975 and in 03/2005  . TONSILLECTOMY     1950    Current Outpatient Medications  Medication Sig Dispense  Refill  . atorvastatin (LIPITOR) 80 MG tablet Take 1 tablet by mouth  daily 90 tablet 3  . clobetasol cream (TEMOVATE) 0.86 % Apply 1 application topically daily as needed (skin irritation).     . Coenzyme Q10 (CO Q-10) 300 MG CAPS Take 300 mg by mouth daily.    . Cyanocobalamin (VITAMIN B-12) 5000 MCG SUBL Place 5,000 mcg under the tongue daily.    [name] docusate sodium (COLACE) 100  MG capsule Take 100 mg by mouth daily.    [name] dofetilide (TIKOSYN) 500 MCG capsule Take 1 capsule (500 mcg total) by mouth 2 (two) times daily. 14 capsule 1  . fish oil-omega-3 fatty acids 1000 MG capsule Take 1 g by mouth 2 (two) times daily.     . Glucosamine-Chondroitin (COSAMIN DS PO) Take 1 tablet by mouth 2 (two) times daily.    . hydrocortisone (ANUSOL-HC) 2.5 % rectal cream Place 1 application rectally 2 (two) times daily. (Patient taking differently: Place 1 application rectally 2 (two) times daily as needed for hemorrhoids. ) 30 g 0  . loratadine (CLARITIN) 10 MG tablet Take 10 mg by mouth daily as needed for allergies.     [name] losartan (COZAAR) 25 MG tablet Take 1 tablet (25 mg total) by mouth daily. 90 tablet 3  . metoprolol succinate (TOPROL-XL) 50 MG 24 hr tablet Take 75 mg by mouth 2 (two) times daily. Take with or immediately following a meal.     . Multiple Vitamins-Minerals (MULTIVITAL-M) TABS Take by mouth.    . Probiotic Product (PROBIOTIC FORMULA PO) Take 1 capsule by mouth 2 (two) times daily.     . sildenafil (VIAGRA) 100 MG tablet Take 1 tablet (100 mg total) by mouth as needed for erectile dysfunction. 10 tablet 6  . warfarin (COUMADIN) 5 MG tablet Take as directed by [facility]mad[facility] Clinic (Patient taking differently: Take 5-7.5 mg by mouth See admin instructions. Take 7.5 mg by mouth daily on Monday, Wednesday and Friday. Take 5 mg by mouth daily on all other days) 120 tablet 1   No current facility-administered medications for this encounter.     Allergies  Allergen Reactions  . Naproxen Hives and Other (See Comments)    Aleve    Social History   Socioeconomic History  . Marital status: Married    Spouse name: Not on file  . Number of children: 2  . Years of education: BS  . Highest education level: Not on file  Occupational History  . Occupation: retired  Scientific laboratory technician  . Financial resource strain: Not on file  . Food insecurity:    Worry: Not on file     Inability: Not on file  . Transportation needs:    Medical: Not on file    Non-medical: Not on file  Tobacco Use  . Smoking status: Never Smoker  . Smokeless tobacco: Never Used  Substance and Sexual Activity  . Alcohol use: Yes    Alcohol/week: 1.8 oz    Types: 1 Glasses of wine, 1 Cans of beer, 1 Shots of liquor per week    Comment: wine occasionally  . Drug use: No  . Sexual activity: Yes    Partners: Female  Lifestyle  . Physical activity:    Days per week: Not on file    Minutes per session: Not on file  . Stress: Not on file  Relationships  . Social connections:    Talks on phone: Not on file    Gets together: Not on  file    Attends religious service: Not on file    Active member of club or organization: Not on file    Attends meetings of clubs or organizations: Not on file    Relationship status: Not on file  . Intimate partner violence:    Fear of current or ex partner: Not on file    Emotionally abused: Not on file    Physically abused: Not on file    Forced sexual activity: Not on file  Other Topics Concern  . Not on file  Social History Narrative   [name] - BS, [place]. married - '67. 2 sons - '74, '77  one son is a Nurse, learning disability for [employer]; 4 grandchildren. 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 marriage in good health. End-of-life: discussed issues and provided packet with the MOST form and out of facility order.    Family History  Problem Relation Age of Onset  . Macular degeneration Mother        macular degeneration  . Cancer Father        intestinal/GI  . Diabetes Paternal Aunt   . Heart attack Maternal Grandfather   . Diabetes Brother     ROS- All systems are reviewed and negative except as per the HPI above.  Physical Exam: Vitals:   08/24/17 1103  BP: (!) 142/84  Pulse: 72  Weight: 200 lb (90.7 kg)  Height: 5\' 8"  (1.727 m)    GEN- The patient is well  appearing, alert and oriented x 3 today.   Head- normocephalic, atraumatic Eyes-  Sclera clear, conjunctiva pink Ears- hearing intact Oropharynx- clear Neck- supple  Lungs- Clear to ausculation bilaterally, normal work of breathing Heart- irregular rate and rhythm,  GI- soft, NT, ND, + BS Extremities- no clubbing, cyanosis, or edema MS- no significant deformity or atrophy Skin- no rash or lesion Psych- euthymic mood, full affect Neuro- strength and sensation are intact  Wt Readings from Last 3 Encounters:  08/24/17 200 lb (90.7 kg)  07/17/17 200 lb (90.7 kg)  06/23/17 198 lb 9.6 oz (90.1 kg)    EKG today demonstrates afib, V rate 72 bpm  Epic records are reviewed at length today  Assessment and Plan:  1. persistent atrial fibrillation with atypical atrial flutter, severe LA enlargement, prior AVR/MVR and MAZE as well as AF ablation at [facility]. Notes from [facility] are reviewed.  Plan is for cardioversion at this time with follow-up studies at [facility] in September to consider aortic root repair, MVR and AVR. Risks and benefits of cardioversion were discussed at length today.  INRs are reviewed and have been therapeutic.  Continue tikosyn (QT stable) Continue coumadin for CHADS2VASC of 4  Return to AF clinic in 1 week Follow-up at [facility] in September as scheduled  Thompson Grayer, MD 08/24/2017 11:13 AM

## 2017-08-24 NOTE — Patient Instructions (Signed)
Description   Continue on same dosage 1 tablet daily except 1.5 tablets on Mondays, Wednesdays, and Fridays. Recheck INR in 1 week after cardioversion.  Call and let us know if scheduled for any procedures [phone] or medication changes.

## 2017-08-31 ENCOUNTER — Encounter (HOSPITAL_COMMUNITY): Payer: Self-pay | Admitting: Nurse Practitioner

## 2017-08-31 ENCOUNTER — Ambulatory Visit (HOSPITAL_COMMUNITY)
Admission: RE | Admit: 2017-08-31 | Discharge: 2017-08-31 | Disposition: A | Discharge status: Home / Self Care | Payer: Medicare Other | Source: Ambulatory Visit | Attending: Nurse Practitioner | Admitting: Nurse Practitioner

## 2017-08-31 ENCOUNTER — Ambulatory Visit (INDEPENDENT_AMBULATORY_CARE_PROVIDER_SITE_OTHER): Payer: Medicare Other | Admitting: *Deleted

## 2017-08-31 VITALS — BP 146/82 | HR 64 | Ht 68.0 in | Wt 198.0 lb

## 2017-08-31 DIAGNOSIS — I4892 Unspecified atrial flutter: Secondary | ICD-10-CM | POA: Diagnosis not present

## 2017-08-31 DIAGNOSIS — Z79899 Other long term (current) drug therapy: Secondary | ICD-10-CM | POA: Insufficient documentation

## 2017-08-31 DIAGNOSIS — Z800 Family history of malignant neoplasm of digestive organs: Secondary | ICD-10-CM | POA: Insufficient documentation

## 2017-08-31 DIAGNOSIS — I481 Persistent atrial fibrillation: Secondary | ICD-10-CM

## 2017-08-31 DIAGNOSIS — I482 Chronic atrial fibrillation, unspecified: Secondary | ICD-10-CM

## 2017-08-31 DIAGNOSIS — Z886 Allergy status to analgesic agent status: Secondary | ICD-10-CM | POA: Insufficient documentation

## 2017-08-31 DIAGNOSIS — Z5181 Encounter for therapeutic drug level monitoring: Secondary | ICD-10-CM | POA: Diagnosis not present

## 2017-08-31 DIAGNOSIS — E785 Hyperlipidemia, unspecified: Secondary | ICD-10-CM | POA: Insufficient documentation

## 2017-08-31 DIAGNOSIS — Z8673 Personal history of transient ischemic attack (TIA), and cerebral infarction without residual deficits: Secondary | ICD-10-CM | POA: Diagnosis not present

## 2017-08-31 DIAGNOSIS — Z9889 Other specified postprocedural states: Secondary | ICD-10-CM | POA: Insufficient documentation

## 2017-08-31 DIAGNOSIS — I4819 Other persistent atrial fibrillation: Secondary | ICD-10-CM

## 2017-08-31 DIAGNOSIS — Z8249 Family history of ischemic heart disease and other diseases of the circulatory system: Secondary | ICD-10-CM | POA: Insufficient documentation

## 2017-08-31 DIAGNOSIS — I484 Atypical atrial flutter: Secondary | ICD-10-CM | POA: Diagnosis not present

## 2017-08-31 DIAGNOSIS — Z952 Presence of prosthetic heart valve: Secondary | ICD-10-CM | POA: Insufficient documentation

## 2017-08-31 DIAGNOSIS — Z833 Family history of diabetes mellitus: Secondary | ICD-10-CM | POA: Insufficient documentation

## 2017-08-31 DIAGNOSIS — Z7901 Long term (current) use of anticoagulants: Secondary | ICD-10-CM | POA: Diagnosis not present

## 2017-08-31 LAB — POCT INR: INR: 3 (ref 2.0–3.0)

## 2017-08-31 NOTE — Progress Notes (Signed)
Primary Care Physician: [name], MD Referring Physician: Dr. Lynn Ito [name] [name] is a 77 y.o. male with a h/o persistent atrial fibrillation with atypical atrial flutter, severe LA enlargement, prior AVR/MVR and MAZE as well as AF ablation at [facility]. HE recently had successful cardioversion, 6/20, and is pending f/u in [facility] in September to discuss further surgery/ ablation for persistent afib. He has INR lab later today. He feels well.  Today, he denies symptoms of palpitations, chest pain, shortness of breath, orthopnea, PND, lower extremity edema, dizziness, presyncope, syncope, or neurologic sequela. The patient is tolerating medications without difficulties and is otherwise without complaint today.   Past Medical History:  Diagnosis Date  . Acute rheumatic heart disease, unspecified    childhood, age  56 & 40  . Acute rheumatic pericarditis   . Atrial fibrillation ([place])    history  . Diverticulosis   . External hemorrhoids without mention of complication   . 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 Lesion of ulnar nerve   . 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 Other and unspecified hyperlipidemia   . Previous back surgery 1978, jan 2007  . Psychosexual dysfunction with inhibited sexual excitement   . SOB (shortness of breath)    "with heavy exercise"  . Stroke [facility])    Past Surgical History:  Procedure Laterality Date  . AORTIC AND MITRAL VALVE REPLACEMENT     09/2004  . CARDIOVERSION     3 times from 2004-2006  . CARDIOVERSION N/A 09/26/2013   Procedure: CARDIOVERSION;  Surgeon: Larey Dresser, MD;  Location: [place];  Service: Cardiovascular;  Laterality: N/A;  . CARDIOVERSION N/A 06/19/2014   Procedure: CARDIOVERSION;  Surgeon: Jerline Pain, MD;  Location: [place];  Service: Cardiovascular;  Laterality: N/A;  . CARDIOVERSION N/A 04/24/2017   Procedure: CARDIOVERSION;  Surgeon: Larey Dresser, MD;  Location: [facility] ENDOSCOPY;  Service:  Cardiovascular;  Laterality: N/A;  . CARDIOVERSION N/A 06/08/2017   Procedure: CARDIOVERSION;  Surgeon: Pixie Casino, MD;  Location: [facility] ENDOSCOPY;  Service: Cardiovascular;  Laterality: N/A;  . CARDIOVERSION N/A 08/24/2017   Procedure: CARDIOVERSION;  Surgeon: Skeet Latch, MD;  Location: [place];  Service: Cardiovascular;  Laterality: N/A;  . COLONOSCOPY    . laminectomies     10/1975 and in 03/2005  . TONSILLECTOMY     1950    Current Outpatient Medications  Medication Sig Dispense Refill  . atorvastatin (LIPITOR) 80 MG tablet Take 1 tablet by mouth  daily 90 tablet 3  . clobetasol cream (TEMOVATE) 3.47 % Apply 1 application topically daily as needed (skin irritation).     . Coenzyme Q10 (CO Q-10) 300 MG CAPS Take 300 mg by mouth daily.    . Cyanocobalamin (VITAMIN B-12) 5000 MCG SUBL Place 5,000 mcg under the tongue daily.    [name] docusate sodium (COLACE) 100 MG capsule Take 100 mg by mouth daily.    [name] dofetilide (TIKOSYN) 500 MCG capsule Take 1 capsule (500 mcg total) by mouth 2 (two) times daily. 14 capsule 1  . fish oil-omega-3 fatty acids 1000 MG capsule Take 1 g by mouth 2 (two) times daily.     . Glucosamine-Chondroitin (COSAMIN DS PO) Take 1 tablet by mouth 2 (two) times daily.    . hydrocortisone (ANUSOL-HC) 2.5 % rectal cream Place 1 application rectally 2 (two) times daily. (Patient taking differently: Place 1 application rectally 2 (two) times  daily as needed for hemorrhoids. ) 30 g 0  . loratadine (CLARITIN) 10 MG tablet Take 10 mg by mouth daily as needed for allergies.     [name] losartan (COZAAR) 25 MG tablet Take 1 tablet (25 mg total) by mouth daily. 90 tablet 3  . metoprolol succinate (TOPROL-XL) 50 MG 24 hr tablet Take 75 mg by mouth 2 (two) times daily. Take with or immediately following a meal.     . Multiple Vitamins-Minerals (MULTIVITAL-M) TABS Take by mouth.    . Probiotic Product (PROBIOTIC FORMULA PO) Take 1 capsule by mouth 2 (two) times daily.     .  sildenafil (VIAGRA) 100 MG tablet Take 1 tablet (100 mg total) by mouth as needed for erectile dysfunction. 10 tablet 6  . warfarin (COUMADIN) 5 MG tablet Take as directed by [facility]madin Clinic (Patient taking differently: Take 5-7.5 mg by mouth See admin instructions. Take 7.5 mg by mouth daily on Monday, Wednesday and Friday. Take 5 mg by mouth daily on all other days) 120 tablet 1   No current facility-administered medications for this encounter.     Allergies  Allergen Reactions  . Naproxen Hives and Other (See Comments)    Aleve    Social History   Socioeconomic History  . Marital status: Married    Spouse name: Not on file  . Number of children: 2  . Years of education: BS  . Highest education level: Not on file  Occupational History  . Occupation: retired  Scientific laboratory technician  . Financial resource strain: Not on file  . Food insecurity:    Worry: Not on file    Inability: Not on file  . Transportation needs:    Medical: Not on file    Non-medical: Not on file  Tobacco Use  . Smoking status: Never Smoker  . Smokeless tobacco: Never Used  Substance and Sexual Activity  . Alcohol use: Yes    Alcohol/week: 1.8 oz    Types: 1 Glasses of wine, 1 Cans of beer, 1 Shots of liquor per week    Comment: wine occasionally  . Drug use: No  . Sexual activity: Yes    Partners: Female  Lifestyle  . Physical activity:    Days per week: Not on file    Minutes per session: Not on file  . Stress: Not on file  Relationships  . Social connections:    Talks on phone: Not on file    Gets together: Not on file    Attends religious service: Not on file    Active member of club or organization: Not on file    Attends meetings of clubs or organizations: Not on file    Relationship status: Not on file  . Intimate partner violence:    Fear of current or ex partner: Not on file    Emotionally abused: Not on file    Physically abused: Not on file    Forced sexual activity: Not on file  Other  Topics Concern  . Not on file  Social History Narrative   [name] - BS, [place]. married - '67. 2 sons - '74, '77  one son is a Nurse, learning disability for [employer]; 4 grandchildren. 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 marriage in good health. End-of-life: discussed issues and provided packet with the MOST form and out of facility order.    Family History  Problem Relation Age of Onset  .  Macular degeneration Mother        macular degeneration  . Cancer Father        intestinal/GI  . Diabetes Paternal Aunt   . Heart attack Maternal Grandfather   . Diabetes Brother     ROS- All systems are reviewed and negative except as per the HPI above  Physical Exam: Vitals:   08/31/17 0927  BP: (!) 146/82  Pulse: 64  Weight: 198 lb (89.8 kg)  Height: 5\' 8"  (1.727 m)   Wt Readings from Last 3 Encounters:  08/31/17 198 lb (89.8 kg)  08/24/17 200 lb (90.7 kg)  07/17/17 200 lb (90.7 kg)    Labs: Lab Results  Component Value Date   NA 142 08/24/2017   K 4.4 08/24/2017   CL 111 08/24/2017   CO2 23 08/24/2017   GLUCOSE 119 (H) 08/24/2017   BUN 21 (H) 08/24/2017   CREATININE 0.94 08/24/2017   CALCIUM 9.0 08/24/2017   MG 2.1 06/23/2017   Lab Results  Component Value Date   INR 2.3 08/24/2017   Lab Results  Component Value Date   CHOL 110 04/28/2017   HDL 32 (L) 04/28/2017   LDLCALC 59 04/28/2017   TRIG 97 04/28/2017     GEN- The patient is well appearing, alert and oriented x 3 today.   Head- normocephalic, atraumatic Eyes-  Sclera clear, conjunctiva pink Ears- hearing intact Oropharynx- clear Neck- supple, no JVP Lymph- no cervical lymphadenopathy Lungs- Clear to ausculation bilaterally, normal work of breathing Heart- Regular rate and rhythm, no murmurs, rubs or gallops, PMI not laterally displaced GI- soft, NT, ND, + BS Extremities- no clubbing, cyanosis, or edema MS- no significant deformity  or atrophy Skin- no rash or lesion Psych- euthymic mood, full affect Neuro- strength and sensation are intact  EKG-SR with first degree AV block at 64 bpm, Pr int 258 ms, qrs int 108 ms, qtc 451 ms Epic records reviewed    Assessment and Plan: 1. Persistent atrial fibrillation with atypical atrial flutter, severe LA enlargement, prior AVR/MVR and MAZE as well as AF ablation at [facility]. Successful cardioversion  Plan at this time is for  follow-up studies at [facility] in September to consider aortic root repair, MVR and AVR.  INRs are reviewed and have been therapeutic.  Continue tikosyn (QT stable) Continue coumadin for CHADS2VASC of 4  F/u in afib clinic as needed  Butch Penny C. Juanisha Bautch, [facility] [address] [place], [place] [address] [phone]

## 2017-08-31 NOTE — Patient Instructions (Addendum)
Description   Continue on same dosage 1 tablet daily except 1.5 tablets on Mondays, Wednesdays, and Fridays. Recheck INR in 3 weeks per request.  Call and let us know if scheduled for any procedures [phone] or medication changes.

## 2017-09-19 ENCOUNTER — Encounter (INDEPENDENT_AMBULATORY_CARE_PROVIDER_SITE_OTHER): Payer: Self-pay

## 2017-09-21 ENCOUNTER — Ambulatory Visit (INDEPENDENT_AMBULATORY_CARE_PROVIDER_SITE_OTHER): Payer: Medicare Other | Admitting: *Deleted

## 2017-09-21 DIAGNOSIS — Z5181 Encounter for therapeutic drug level monitoring: Secondary | ICD-10-CM

## 2017-09-21 DIAGNOSIS — I482 Chronic atrial fibrillation, unspecified: Secondary | ICD-10-CM

## 2017-09-21 DIAGNOSIS — I4892 Unspecified atrial flutter: Secondary | ICD-10-CM

## 2017-09-21 LAB — POCT INR: INR: 2.6 (ref 2.0–3.0)

## 2017-09-21 NOTE — Patient Instructions (Signed)
Description   Continue on same dosage 1 tablet daily except 1.5 tablets on Mondays, Wednesdays, and Fridays. Recheck INR in 4 weeks per request.  Call and let us know if scheduled for any procedures [phone] or medication changes.

## 2017-10-12 DIAGNOSIS — R972 Elevated prostate specific antigen [PSA]: Secondary | ICD-10-CM | POA: Diagnosis not present

## 2017-10-12 LAB — PSA: PSA: 5.87

## 2017-10-17 ENCOUNTER — Telehealth (HOSPITAL_COMMUNITY): Payer: Self-pay | Admitting: *Deleted

## 2017-10-17 DIAGNOSIS — T451X1A Poisoning by antineoplastic and immunosuppressive drugs, accidental (unintentional), initial encounter: Secondary | ICD-10-CM | POA: Diagnosis not present

## 2017-10-17 DIAGNOSIS — T462X1A Poisoning by other antidysrhythmic drugs, accidental (unintentional), initial encounter: Secondary | ICD-10-CM | POA: Diagnosis not present

## 2017-10-17 DIAGNOSIS — Z79899 Other long term (current) drug therapy: Secondary | ICD-10-CM | POA: Diagnosis not present

## 2017-10-17 DIAGNOSIS — Z7901 Long term (current) use of anticoagulants: Secondary | ICD-10-CM | POA: Diagnosis not present

## 2017-10-17 DIAGNOSIS — T447X1A Poisoning by beta-adrenoreceptor antagonists, accidental (unintentional), initial encounter: Secondary | ICD-10-CM | POA: Diagnosis not present

## 2017-10-17 DIAGNOSIS — I10 Essential (primary) hypertension: Secondary | ICD-10-CM | POA: Diagnosis not present

## 2017-10-17 NOTE — Telephone Encounter (Signed)
Patient called in today at 1145 stating he just realized he took 532mcg of tikosyn 2 hours in a row as well as 75mg  of metoprolol 2 hours in a row. He forgot he took his morning medications and repeated them in a hour by mistake. He is currently feeling ok - HR is 59. Patient is currently out of town in [place], [place]. Discussed with Roderic Palau NP -- she recommends patient proceed to the closest ER for several hours of monitoring. He will hold his dose of tikosyn and metoprolol tonight and resume as normal tomorrow. Pt verbalized understanding. He did call back at 1pm stating he was currently in the [facility] waiting to be checked in for observation - will call back if any issues.

## 2017-10-19 ENCOUNTER — Ambulatory Visit (INDEPENDENT_AMBULATORY_CARE_PROVIDER_SITE_OTHER): Payer: Medicare Other | Admitting: Pharmacist

## 2017-10-19 DIAGNOSIS — I4892 Unspecified atrial flutter: Secondary | ICD-10-CM | POA: Diagnosis not present

## 2017-10-19 DIAGNOSIS — Z5181 Encounter for therapeutic drug level monitoring: Secondary | ICD-10-CM | POA: Diagnosis not present

## 2017-10-19 DIAGNOSIS — I482 Chronic atrial fibrillation, unspecified: Secondary | ICD-10-CM

## 2017-10-19 DIAGNOSIS — R972 Elevated prostate specific antigen [PSA]: Secondary | ICD-10-CM | POA: Diagnosis not present

## 2017-10-19 DIAGNOSIS — N5201 Erectile dysfunction due to arterial insufficiency: Secondary | ICD-10-CM | POA: Diagnosis not present

## 2017-10-19 LAB — POCT INR: INR: 2.2 (ref 2.0–3.0)

## 2017-10-19 NOTE — Patient Instructions (Signed)
Description   Continue on same dosage 1 tablet daily except 1.5 tablets on Mondays, Wednesdays, and Fridays. Recheck INR in 5 weeks per request.  Call and let us know if scheduled for any procedures [phone] or medication changes.

## 2017-10-20 ENCOUNTER — Encounter (INDEPENDENT_AMBULATORY_CARE_PROVIDER_SITE_OTHER): Payer: Self-pay | Admitting: Orthopaedic Surgery

## 2017-10-20 ENCOUNTER — Other Ambulatory Visit (INDEPENDENT_AMBULATORY_CARE_PROVIDER_SITE_OTHER): Payer: Self-pay | Admitting: Radiology

## 2017-10-20 ENCOUNTER — Ambulatory Visit (INDEPENDENT_AMBULATORY_CARE_PROVIDER_SITE_OTHER): Payer: Medicare Other | Admitting: Orthopaedic Surgery

## 2017-10-20 VITALS — BP 144/82 | HR 62 | Ht 68.0 in | Wt 195.0 lb

## 2017-10-20 DIAGNOSIS — M25551 Pain in right hip: Secondary | ICD-10-CM

## 2017-10-20 DIAGNOSIS — M1611 Unilateral primary osteoarthritis, right hip: Secondary | ICD-10-CM | POA: Diagnosis not present

## 2017-10-20 NOTE — Progress Notes (Signed)
Office Visit Note   Patient: [name] [name]           Date of Birth: 08-Jun-1940           MRN: [id number] Visit Date: 10/20/2017              Requested by: [name], MD [address] [place], [place] [address] PCP: [name], MD   Assessment & Plan: Visit Diagnoses:  1. Unilateral primary osteoarthritis, right hip     Plan: Primary osteoarthritis right hip long discussion regarding diagnosis and treatment options.  Has been using a cane.  Multiple cardiac comorbidities which at this point might preclude surgery.  Also has a history of TIA presently taking Coumadin.  Will try physical therapy.  Consider intra-articular cortisone injection.  Have discussed total hip replacement but I really think at this point would better off considering nonoperative treatment as long as he can tolerate the discomfort.  Will consider follow-up films at some point in the future as particularly if therapy does not help.  He can call about an intra-articular cortisone injection  Follow-Up Instructions: Return if symptoms worsen or fail to improve.   Orders:  No orders of the defined types were placed in this encounter.  No orders of the defined types were placed in this encounter.     Procedures: No procedures performed   Clinical Data: No additional findings.   Subjective: Chief Complaint  Patient presents with  . New Patient (Initial Visit)    R HIP PAIN FOR OVER 1 YR JUST GETTING WORSE. WOULD LIKE TO DISCUSS PT.  [name] [name] is 77 years old and has been seen in the past with a diagnosis of osteoarthritis of his right hip.  For the past year he has had some increase in his pain and certainly limitation of motion.  He has a number of cardiac issues including atrial fibrillation.  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  He is being followed through the cardiology department at [facility] clinic.  He is  on chronic Coumadin.  He has had a prior TIA and atrial fibrillation.  Presently he is using a cane relative to his right hip pain.  Prior films demonstrated primary osteoarthritis.  He is not having any pain distal to his knee.  No numbness or tingling.  Back pain is presently quiet  HPI  Review of Systems  Constitutional: Negative for fatigue and fever.  HENT: Negative for ear pain.   Eyes: Negative for pain.  Respiratory: Negative for cough and shortness of breath.   Cardiovascular: Negative for leg swelling.  Gastrointestinal: Negative for constipation and diarrhea.  Genitourinary: Positive for difficulty urinating.  Musculoskeletal: Positive for neck pain. Negative for back pain.  Skin: Negative for rash.  Allergic/Immunologic: Negative for food allergies.  Neurological: Positive for weakness. Negative for numbness.  Hematological: Bruises/bleeds easily.  Psychiatric/Behavioral: Negative for sleep disturbance.     Objective: Vital Signs: BP (!) 144/82 (BP Location: Left Arm, Patient Position: Sitting, Cuff Size: Normal)   Pulse 62   Ht 5\' 8"  (1.727 m)   Wt 195 lb (88.5 kg)   BMI 29.65 kg/m   Physical Exam  Constitutional: He is oriented to person, place, and time. He appears well-developed and well-nourished.  HENT:  Mouth/Throat: Oropharynx is clear and moist.  Eyes: Pupils are equal, round, and reactive to light. EOM are normal.  Pulmonary/Chest:  Effort normal.  Neurological: He is alert and oriented to person, place, and time.  Skin: Skin is warm and dry.  Psychiatric: He has a normal mood and affect. His behavior is normal.    Ortho Exam awake alert and oriented x3.  Comfortable sitting.  Accompanied by his son, Jeral Fruit.  Significant decreased internal/external rotation with pain right hip compared to the left consistent with his osteoarthritis.  He is some atrophy of his right thigh compared to his left.  No knee pain.  No knee effusion.  Neurovascular exam intact.  No  distal edema.  Straight leg raise negative for any back pain.  Specialty Comments:  No specialty comments available.  Imaging: No results found.   PMFS History: Patient Active Problem List   Diagnosis Date Noted  . Visit for monitoring Tikosyn therapy 06/06/2017  . Internal hemorrhoid, bleeding 05/19/2016  . 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  . 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  . Aortic valve disorder 07/09/2015  . Long term current use of anticoagulant therapy 01/19/2015  . Chronic a-fib ([place]) 01/20/2014  . Elevated PSA 12/23/2013  . Prediabetes 12/19/2013  . Benign prostatic hyperplasia with urinary obstruction 10/29/2013  . 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 Atrial flutter ([place]) 05/17/2013  . Essential hypertension 06/19/2012  . Routine health maintenance 04/26/2011  . Hyperlipidemia with target LDL less than 130 07/07/2008  . History of colonic polyps 06/16/2008  . ERECTILE DYSFUNCTION 09/25/2007   Past Medical History:  Diagnosis Date  . Acute rheumatic heart disease, unspecified    childhood, age  77 & 89  . Acute rheumatic pericarditis   . Atrial fibrillation ([place])    history  . Diverticulosis   . 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 External hemorrhoids without mention of complication   . 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 Lesion of ulnar nerve   . 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 Other and unspecified hyperlipidemia   . Previous back surgery 1978, jan 2007  . Psychosexual dysfunction with inhibited sexual excitement   . SOB (shortness of breath)    "with heavy exercise"  . Stroke [facility])     Family History  Problem Relation Age of Onset  . Macular degeneration Mother        macular degeneration  . Cancer Father        intestinal/GI  . Diabetes Paternal Aunt   . Heart attack Maternal Grandfather   . Diabetes Brother     Past Surgical History:  Procedure Laterality Date  .  AORTIC AND MITRAL VALVE REPLACEMENT     09/2004  . CARDIOVERSION     3 times from 2004-2006  . CARDIOVERSION N/A 09/26/2013   Procedure: CARDIOVERSION;  Surgeon: Larey Dresser, MD;  Location: [place];  Service: Cardiovascular;  Laterality: N/A;  . CARDIOVERSION N/A 06/19/2014   Procedure: CARDIOVERSION;  Surgeon: Jerline Pain, MD;  Location: [place];  Service: Cardiovascular;  Laterality: N/A;  . CARDIOVERSION N/A 04/24/2017   Procedure: CARDIOVERSION;  Surgeon: Larey Dresser, MD;  Location: [facility] ENDOSCOPY;  Service: Cardiovascular;  Laterality: N/A;  . CARDIOVERSION N/A 06/08/2017   Procedure: CARDIOVERSION;  Surgeon: Pixie Casino, MD;  Location: [facility] ENDOSCOPY;  Service: Cardiovascular;  Laterality: N/A;  . CARDIOVERSION N/A 08/24/2017   Procedure: CARDIOVERSION;  Surgeon: Skeet Latch, MD;  Location: [place];  Service: Cardiovascular;  Laterality: N/A;  . COLONOSCOPY    .  laminectomies     10/1975 and in 03/2005  . TONSILLECTOMY     1950   Social History   Occupational History  . Occupation: retired  Tobacco Use  . Smoking status: Never Smoker  . Smokeless tobacco: Never Used  Substance and Sexual Activity  . Alcohol use: Yes    Alcohol/week: 3.0 standard drinks    Types: 1 Glasses of wine, 1 Cans of beer, 1 Shots of liquor per week    Comment: wine occasionally  . Drug use: No  . Sexual activity: Yes    Partners: Female

## 2017-11-09 ENCOUNTER — Ambulatory Visit (HOSPITAL_COMMUNITY): Payer: Medicare Other | Admitting: Nurse Practitioner

## 2017-11-09 DIAGNOSIS — M6281 Muscle weakness (generalized): Secondary | ICD-10-CM | POA: Diagnosis not present

## 2017-11-09 DIAGNOSIS — R262 Difficulty in walking, not elsewhere classified: Secondary | ICD-10-CM | POA: Diagnosis not present

## 2017-11-09 DIAGNOSIS — M1611 Unilateral primary osteoarthritis, right hip: Secondary | ICD-10-CM | POA: Diagnosis not present

## 2017-11-09 DIAGNOSIS — M25551 Pain in right hip: Secondary | ICD-10-CM | POA: Diagnosis not present

## 2017-11-14 DIAGNOSIS — M1611 Unilateral primary osteoarthritis, right hip: Secondary | ICD-10-CM | POA: Diagnosis not present

## 2017-11-14 DIAGNOSIS — M25551 Pain in right hip: Secondary | ICD-10-CM | POA: Diagnosis not present

## 2017-11-14 DIAGNOSIS — M6281 Muscle weakness (generalized): Secondary | ICD-10-CM | POA: Diagnosis not present

## 2017-11-14 DIAGNOSIS — R262 Difficulty in walking, not elsewhere classified: Secondary | ICD-10-CM | POA: Diagnosis not present

## 2017-11-16 DIAGNOSIS — R262 Difficulty in walking, not elsewhere classified: Secondary | ICD-10-CM | POA: Diagnosis not present

## 2017-11-16 DIAGNOSIS — M1611 Unilateral primary osteoarthritis, right hip: Secondary | ICD-10-CM | POA: Diagnosis not present

## 2017-11-16 DIAGNOSIS — M25551 Pain in right hip: Secondary | ICD-10-CM | POA: Diagnosis not present

## 2017-11-16 DIAGNOSIS — Z23 Encounter for immunization: Secondary | ICD-10-CM | POA: Diagnosis not present

## 2017-11-16 DIAGNOSIS — M6281 Muscle weakness (generalized): Secondary | ICD-10-CM | POA: Diagnosis not present

## 2017-11-17 ENCOUNTER — Other Ambulatory Visit: Payer: Self-pay | Admitting: *Deleted

## 2017-11-17 MED ORDER — WARFARIN SODIUM 5 MG PO TABS: ORAL_TABLET | ORAL | 1 refills | Status: DC

## 2017-11-21 DIAGNOSIS — M25551 Pain in right hip: Secondary | ICD-10-CM | POA: Diagnosis not present

## 2017-11-21 DIAGNOSIS — R262 Difficulty in walking, not elsewhere classified: Secondary | ICD-10-CM | POA: Diagnosis not present

## 2017-11-21 DIAGNOSIS — M1611 Unilateral primary osteoarthritis, right hip: Secondary | ICD-10-CM | POA: Diagnosis not present

## 2017-11-21 DIAGNOSIS — M6281 Muscle weakness (generalized): Secondary | ICD-10-CM | POA: Diagnosis not present

## 2017-11-23 DIAGNOSIS — R262 Difficulty in walking, not elsewhere classified: Secondary | ICD-10-CM | POA: Diagnosis not present

## 2017-11-23 DIAGNOSIS — M25551 Pain in right hip: Secondary | ICD-10-CM | POA: Diagnosis not present

## 2017-11-23 DIAGNOSIS — M6281 Muscle weakness (generalized): Secondary | ICD-10-CM | POA: Diagnosis not present

## 2017-11-23 DIAGNOSIS — M1611 Unilateral primary osteoarthritis, right hip: Secondary | ICD-10-CM | POA: Diagnosis not present

## 2017-11-24 ENCOUNTER — Ambulatory Visit (INDEPENDENT_AMBULATORY_CARE_PROVIDER_SITE_OTHER): Payer: Medicare Other | Admitting: *Deleted

## 2017-11-24 DIAGNOSIS — I482 Chronic atrial fibrillation, unspecified: Secondary | ICD-10-CM

## 2017-11-24 DIAGNOSIS — Z5181 Encounter for therapeutic drug level monitoring: Secondary | ICD-10-CM | POA: Diagnosis not present

## 2017-11-24 DIAGNOSIS — I4892 Unspecified atrial flutter: Secondary | ICD-10-CM | POA: Diagnosis not present

## 2017-11-24 LAB — POCT INR: INR: 1.7 — AB (ref 2.0–3.0)

## 2017-11-24 NOTE — Patient Instructions (Signed)
Description   Today take 2 tablets then continue on same dosage 1 tablet daily except 1.5 tablets on Mondays, Wednesdays, and Fridays. Recheck INR in 3 weeks.  Call and let us know if scheduled for any procedures [phone] or medication changes.

## 2017-11-29 DIAGNOSIS — I484 Atypical atrial flutter: Secondary | ICD-10-CM | POA: Diagnosis not present

## 2017-11-29 DIAGNOSIS — I359 Nonrheumatic aortic valve disorder, unspecified: Secondary | ICD-10-CM | POA: Diagnosis not present

## 2017-11-29 DIAGNOSIS — I712 Thoracic aortic aneurysm, without rupture: Secondary | ICD-10-CM | POA: Diagnosis not present

## 2017-11-29 DIAGNOSIS — I350 Nonrheumatic aortic (valve) stenosis: Secondary | ICD-10-CM | POA: Diagnosis not present

## 2017-11-29 DIAGNOSIS — I059 Rheumatic mitral valve disease, unspecified: Secondary | ICD-10-CM | POA: Diagnosis not present

## 2017-11-29 DIAGNOSIS — Z952 Presence of prosthetic heart valve: Secondary | ICD-10-CM | POA: Diagnosis not present

## 2017-12-04 DIAGNOSIS — M6281 Muscle weakness (generalized): Secondary | ICD-10-CM | POA: Diagnosis not present

## 2017-12-04 DIAGNOSIS — R262 Difficulty in walking, not elsewhere classified: Secondary | ICD-10-CM | POA: Diagnosis not present

## 2017-12-04 DIAGNOSIS — M1611 Unilateral primary osteoarthritis, right hip: Secondary | ICD-10-CM | POA: Diagnosis not present

## 2017-12-04 DIAGNOSIS — M25551 Pain in right hip: Secondary | ICD-10-CM | POA: Diagnosis not present

## 2017-12-06 DIAGNOSIS — M1611 Unilateral primary osteoarthritis, right hip: Secondary | ICD-10-CM | POA: Diagnosis not present

## 2017-12-06 DIAGNOSIS — R262 Difficulty in walking, not elsewhere classified: Secondary | ICD-10-CM | POA: Diagnosis not present

## 2017-12-06 DIAGNOSIS — M25551 Pain in right hip: Secondary | ICD-10-CM | POA: Diagnosis not present

## 2017-12-06 DIAGNOSIS — M6281 Muscle weakness (generalized): Secondary | ICD-10-CM | POA: Diagnosis not present

## 2017-12-11 ENCOUNTER — Encounter: Payer: Self-pay | Admitting: Allergy and Immunology

## 2017-12-11 ENCOUNTER — Ambulatory Visit (INDEPENDENT_AMBULATORY_CARE_PROVIDER_SITE_OTHER): Payer: Medicare Other | Admitting: Allergy and Immunology

## 2017-12-11 VITALS — BP 142/80 | HR 72 | Resp 16 | Ht 68.0 in | Wt 200.4 lb

## 2017-12-11 DIAGNOSIS — R1110 Vomiting, unspecified: Secondary | ICD-10-CM | POA: Diagnosis not present

## 2017-12-11 DIAGNOSIS — T7840XD Allergy, unspecified, subsequent encounter: Secondary | ICD-10-CM | POA: Diagnosis not present

## 2017-12-11 DIAGNOSIS — T7800XD Anaphylactic reaction due to unspecified food, subsequent encounter: Secondary | ICD-10-CM

## 2017-12-11 DIAGNOSIS — Z91018 Allergy to other foods: Secondary | ICD-10-CM | POA: Insufficient documentation

## 2017-12-11 DIAGNOSIS — T7840XA Allergy, unspecified, initial encounter: Secondary | ICD-10-CM | POA: Insufficient documentation

## 2017-12-11 NOTE — Progress Notes (Addendum)
New Patient Note  RE: [name] [name] MRN: [id number] DOB: 04-01-1940 Date of Office Visit: 12/11/2017  Referring provider: [name], MD Primary care provider: [name], MD  Chief Complaint: Allergic Reaction   History of present illness: [name] [name] is a 77 y.o. male seen today in consultation requested by Scarlette Calico, MD.  He reports that in February 2019 he consumed 6 scallops and a salad.  Approximately an hour and 1/2 to 2 hours later he experienced "a severe gastric reaction" including vomiting and diarrhea.  The GI symptoms lasted for approximately 24 hours.  He did not experience concomitant urticaria, angioedema, or cardiopulmonary symptoms.  Approximately 1 month ago he consumed a pork chop, roasted vegetables, and 1 scallop and had a similar, though less severe, reaction.  He experienced vomiting and diarrhea.  Again, he did not experience cutaneous or cardiopulmonary symptoms.  Assessment and plan: History of food allergy Patient's history suggest the possibility of food allergy, however skin tests to select food allergens were negative today. The negative predictive value of food allergen skin testing is excellent however there is still a 5% chance that the allergy exists. Skin testing does not rule out food intolerances or cell-mediated enteropathies which may lend to GI symptoms. These etiologies are suggested when elimination of the responsible food leads to symptom resolution and re-introduction of the food is followed by the return of symptoms.   A laboratory form has been provided for serum specific IgE against shellfish panel and alpha-gal panel.  For now, continue careful avoidance of shellfish.  The patient has been encouraged to keep a careful symptom/food journal and eliminate any food suspected of correlating with symptoms. Should symptoms concerning for anaphylaxis arise, 911 is to be called immediately.  If GI symptoms persist or progress without  a clear trigger, gastroenterologist evaluation may be warranted.   Diagnostics: Food allergen skin testing: Negative despite a positive histamine control.    Physical examination: Blood pressure (!) 142/80, pulse 72, resp. rate 16, height 5\' 8"  (1.727 m), weight 200 lb 6.4 oz (90.9 kg).  General: Alert, interactive, in no acute distress. Neck: Supple without lymphadenopathy. Lungs: Clear to auscultation without wheezing, rhonchi or rales. CV: Normal S1, S2 without murmurs. Abdomen: Nondistended, nontender. Skin: Warm and dry, without lesions or rashes. Extremities:  No clubbing, cyanosis or edema. Neuro:   Grossly intact.  Review of systems:  Review of systems negative except as noted in HPI / PMHx or noted below: Review of Systems  Constitutional: Negative.   HENT: Negative.   Eyes: Negative.   Respiratory: Negative.   Cardiovascular: Negative.   Gastrointestinal: Negative.   Genitourinary: Negative.   Musculoskeletal: Negative.   Skin: Negative.   Neurological: Negative.   Endo/Heme/Allergies: Negative.   Psychiatric/Behavioral: Negative.     Past medical history:  Past Medical History:  Diagnosis Date  . Acute rheumatic heart disease, unspecified    childhood, age  41 & 18  . Acute rheumatic pericarditis   . Atrial fibrillation ([place])    history  . Diverticulosis   . 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 External hemorrhoids without mention of complication   . 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 Lesion of ulnar nerve   . 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 Other and unspecified hyperlipidemia   . Previous back surgery 1978, jan 2007  . Psychosexual dysfunction with inhibited sexual excitement   . SOB (  shortness of breath)    "with heavy exercise"  . Stroke [facility])     Past surgical history:  Past Surgical History:  Procedure Laterality Date  . AORTIC AND MITRAL VALVE REPLACEMENT     09/2004  . CARDIOVERSION     3 times from 2004-2006    . CARDIOVERSION N/A 09/26/2013   Procedure: CARDIOVERSION;  Surgeon: Larey Dresser, MD;  Location: [place];  Service: Cardiovascular;  Laterality: N/A;  . CARDIOVERSION N/A 06/19/2014   Procedure: CARDIOVERSION;  Surgeon: Jerline Pain, MD;  Location: [place];  Service: Cardiovascular;  Laterality: N/A;  . CARDIOVERSION N/A 04/24/2017   Procedure: CARDIOVERSION;  Surgeon: Larey Dresser, MD;  Location: [facility] ENDOSCOPY;  Service: Cardiovascular;  Laterality: N/A;  . CARDIOVERSION N/A 06/08/2017   Procedure: CARDIOVERSION;  Surgeon: Pixie Casino, MD;  Location: [facility] ENDOSCOPY;  Service: Cardiovascular;  Laterality: N/A;  . CARDIOVERSION N/A 08/24/2017   Procedure: CARDIOVERSION;  Surgeon: Skeet Latch, MD;  Location: [place];  Service: Cardiovascular;  Laterality: N/A;  . COLONOSCOPY    . laminectomies     10/1975 and in 03/2005  . TONSILLECTOMY     1950    Family history: Family History  Problem Relation Age of Onset  . Macular degeneration Mother        macular degeneration  . Cancer Father        intestinal/GI  . Diabetes Paternal Aunt   . Heart attack Maternal Grandfather   . Diabetes Brother     Social history: Social History   Socioeconomic History  . Marital status: Married    Spouse name: Not on file  . Number of children: 2  . Years of education: BS  . Highest education level: Not on file  Occupational History  . Occupation: retired  Scientific laboratory technician  . Financial resource strain: Not on file  . Food insecurity:    Worry: Not on file    Inability: Not on file  . Transportation needs:    Medical: Not on file    Non-medical: Not on file  Tobacco Use  . Smoking status: Never Smoker  . Smokeless tobacco: Never Used  Substance and Sexual Activity  . Alcohol use: Yes    Alcohol/week: 3.0 standard drinks    Types: 1 Glasses of wine, 1 Cans of beer, 1 Shots of liquor per week    Comment: wine occasionally  . Drug use: No  . Sexual activity: Yes     Partners: Female  Lifestyle  . Physical activity:    Days per week: Not on file    Minutes per session: Not on file  . Stress: Not on file  Relationships  . Social connections:    Talks on phone: Not on file    Gets together: Not on file    Attends religious service: Not on file    Active member of club or organization: Not on file    Attends meetings of clubs or organizations: Not on file    Relationship status: Not on file  . Intimate partner violence:    Fear of current or ex partner: Not on file    Emotionally abused: Not on file    Physically abused: Not on file    Forced sexual activity: Not on file  Other Topics Concern  . Not on file  Social History Narrative   [name] - BS, [place]. married - '67. 2 sons - '74, '77  one son is a Nurse, learning disability for  [employer] - [employer]; 4 grandchildren. 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 marriage in good health. End-of-life: discussed issues and provided packet with the MOST form and out of facility order.   Environmental History: Patient lives in a 75 year old house with carpeting the bedroom, gas heat, and central air.  He is a non-smoker without pets.  There is no known mold/water damage in the home.  Allergies as of 12/11/2017      Reactions   Naproxen Hives, Other (See Comments)   Aleve      Medication List        Accurate as of 12/11/17 11:59 PM. Always use your most recent med list.          atorvastatin 80 MG tablet Commonly known as:  LIPITOR Take 1 tablet by mouth  daily   clobetasol cream 0.05 % Commonly known as:  TEMOVATE Apply 1 application topically daily as needed (skin irritation).   Co Q-10 300 MG Caps Take 300 mg by mouth daily.   COSAMIN DS PO Take 1 tablet by mouth 2 (two) times daily.   docusate sodium 100 MG capsule Commonly known as:  COLACE Take 100 mg by mouth daily.   dofetilide 500 MCG capsule Commonly known as:   TIKOSYN Take 1 capsule (500 mcg total) by mouth 2 (two) times daily.   fish oil-omega-3 fatty acids 1000 MG capsule Take 1 g by mouth 2 (two) times daily.   hydrocortisone 2.5 % rectal cream Commonly known as:  ANUSOL-HC Place 1 application rectally 2 (two) times daily.   loratadine 10 MG tablet Commonly known as:  CLARITIN Take 10 mg by mouth daily as needed for allergies.   losartan 25 MG tablet Commonly known as:  COZAAR Take 1 tablet (25 mg total) by mouth daily.   metoprolol succinate 50 MG 24 hr tablet Commonly known as:  TOPROL-XL Take 75 mg by mouth 2 (two) times daily. Take with or immediately following a meal.   MULTIVITAL-M Tabs Take by mouth.   PROBIOTIC FORMULA PO Take 1 capsule by mouth 2 (two) times daily.   sildenafil 100 MG tablet Commonly known as:  VIAGRA Take 1 tablet (100 mg total) by mouth as needed for erectile dysfunction.   Vitamin B-12 5000 MCG Subl Place 5,000 mcg under the tongue daily.   warfarin 5 MG tablet Commonly known as:  COUMADIN Take as directed by the anticoagulation clinic. If you are unsure how to take this medication, talk to your nurse or doctor. Original instructions:  Take as directed by Coumadin Clinic       Known medication allergies: Allergies  Allergen Reactions  . Naproxen Hives and Other (See Comments)    Aleve    I appreciate the opportunity to take part in Hansen's care. Please do not hesitate to contact me with questions.  Sincerely,   R. Edgar Frisk, MD

## 2017-12-11 NOTE — Assessment & Plan Note (Addendum)
Patient's history suggest the possibility of food allergy, however skin tests to select food allergens were negative today. The negative predictive value of food allergen skin testing is excellent however there is still a 5% chance that the allergy exists. Skin testing does not rule out food intolerances or cell-mediated enteropathies which may lend to GI symptoms. These etiologies are suggested when elimination of the responsible food leads to symptom resolution and re-introduction of the food is followed by the return of symptoms.   A laboratory form has been provided for serum specific IgE against shellfish panel and alpha-gal panel.  For now, continue careful avoidance of shellfish.  The patient has been encouraged to keep a careful symptom/food journal and eliminate any food suspected of correlating with symptoms. Should symptoms concerning for anaphylaxis arise, 911 is to be called immediately.  If GI symptoms persist or progress without a clear trigger, gastroenterologist evaluation may be warranted.

## 2017-12-11 NOTE — Patient Instructions (Addendum)
History of food allergy Patient's history suggest the possibility of food allergy, however skin tests to select food allergens were negative today. The negative predictive value of food allergen skin testing is excellent however there is still a 5% chance that the allergy exists. Skin testing does not rule out food intolerances or cell-mediated enteropathies which may lend to GI symptoms. These etiologies are suggested when elimination of the responsible food leads to symptom resolution and re-introduction of the food is followed by the return of symptoms.   A laboratory form has been provided for serum specific IgE against shellfish panel and alpha-gal panel.  For now, continue careful avoidance of shellfish.  The patient has been encouraged to keep a careful symptom/food journal and eliminate any food suspected of correlating with symptoms. Should symptoms concerning for anaphylaxis arise, 911 is to be called immediately.  If GI symptoms persist or progress without a clear trigger, gastroenterologist evaluation may be warranted.   When lab results have returned the patient will be called with further recommendations and follow up instructions.

## 2017-12-12 ENCOUNTER — Telehealth: Payer: Self-pay

## 2017-12-12 DIAGNOSIS — M1611 Unilateral primary osteoarthritis, right hip: Secondary | ICD-10-CM | POA: Diagnosis not present

## 2017-12-12 DIAGNOSIS — R262 Difficulty in walking, not elsewhere classified: Secondary | ICD-10-CM | POA: Diagnosis not present

## 2017-12-12 DIAGNOSIS — M6281 Muscle weakness (generalized): Secondary | ICD-10-CM | POA: Diagnosis not present

## 2017-12-12 DIAGNOSIS — M25551 Pain in right hip: Secondary | ICD-10-CM | POA: Diagnosis not present

## 2017-12-12 NOTE — Telephone Encounter (Signed)
Alpha-gal panel added to patients blood work per Dr. Starling Manns.

## 2017-12-12 NOTE — Telephone Encounter (Signed)
-----   Message from Adelina Mings, MD sent at 12/11/2017 10:28 PM EDT ----- Please add alpha-gal panel to the patient's blood work. Thanks.

## 2017-12-12 NOTE — Addendum Note (Signed)
Addended by: Lynnae Sandhoff on: 12/12/2017 08:33 AM   Modules accepted: Orders

## 2017-12-13 ENCOUNTER — Ambulatory Visit (INDEPENDENT_AMBULATORY_CARE_PROVIDER_SITE_OTHER): Payer: Medicare Other | Admitting: *Deleted

## 2017-12-13 DIAGNOSIS — Z5181 Encounter for therapeutic drug level monitoring: Secondary | ICD-10-CM | POA: Diagnosis not present

## 2017-12-13 DIAGNOSIS — I4892 Unspecified atrial flutter: Secondary | ICD-10-CM

## 2017-12-13 DIAGNOSIS — I482 Chronic atrial fibrillation, unspecified: Secondary | ICD-10-CM

## 2017-12-13 LAB — POCT INR: INR: 2.1 (ref 2.0–3.0)

## 2017-12-13 NOTE — Patient Instructions (Addendum)
Description   Continue on same dosage 1 tablet daily except 1.5 tablets on Mondays, Wednesdays, and Fridays. Recheck INR in 5 weeks per pt request.  Call and let us know if scheduled for any procedures [phone] or medication changes.

## 2017-12-15 LAB — ALPHA-GAL PANEL
Alpha Gal IgE*: <0.1 kU/L (ref <0.10)
Beef (Bos spp) IgE: <0.1 kU/L (ref <0.35)
Class Interpretation: 0
Class Interpretation: 0
Class Interpretation: 0
Lamb/Mutton (Ovis spp) IgE: <0.1 kU/L (ref <0.35)
Pork (Sus spp) IgE: <0.1 kU/L (ref <0.35)

## 2017-12-15 LAB — ALLERGEN PROFILE, SHELLFISH
Clam IgE: <0.1 kU/L
F023-IgE Crab: <0.1 kU/L
F080-IgE Lobster: <0.1 kU/L
F290-IgE Oyster: <0.1 kU/L
Scallop IgE: <0.1 kU/L
Shrimp IgE: <0.1 kU/L

## 2017-12-15 LAB — SPECIMEN STATUS REPORT

## 2017-12-21 DIAGNOSIS — M1611 Unilateral primary osteoarthritis, right hip: Secondary | ICD-10-CM | POA: Diagnosis not present

## 2017-12-21 DIAGNOSIS — M25551 Pain in right hip: Secondary | ICD-10-CM | POA: Diagnosis not present

## 2017-12-21 DIAGNOSIS — M6281 Muscle weakness (generalized): Secondary | ICD-10-CM | POA: Diagnosis not present

## 2017-12-21 DIAGNOSIS — R262 Difficulty in walking, not elsewhere classified: Secondary | ICD-10-CM | POA: Diagnosis not present

## 2017-12-29 ENCOUNTER — Telehealth (HOSPITAL_COMMUNITY): Payer: Self-pay

## 2017-12-29 ENCOUNTER — Ambulatory Visit (HOSPITAL_COMMUNITY)
Admission: RE | Admit: 2017-12-29 | Discharge: 2017-12-29 | Disposition: A | Discharge status: Home / Self Care | Payer: Medicare Other | Source: Ambulatory Visit | Attending: Cardiology | Admitting: Cardiology

## 2017-12-29 VITALS — BP 142/86 | HR 67 | Wt 202.8 lb

## 2017-12-29 DIAGNOSIS — I5032 Chronic diastolic (congestive) heart failure: Secondary | ICD-10-CM | POA: Insufficient documentation

## 2017-12-29 DIAGNOSIS — Z79899 Other long term (current) drug therapy: Secondary | ICD-10-CM | POA: Insufficient documentation

## 2017-12-29 DIAGNOSIS — Z953 Presence of xenogenic heart valve: Secondary | ICD-10-CM | POA: Diagnosis not present

## 2017-12-29 DIAGNOSIS — Z8673 Personal history of transient ischemic attack (TIA), and cerebral infarction without residual deficits: Secondary | ICD-10-CM | POA: Insufficient documentation

## 2017-12-29 DIAGNOSIS — I484 Atypical atrial flutter: Secondary | ICD-10-CM | POA: Diagnosis not present

## 2017-12-29 DIAGNOSIS — I10 Essential (primary) hypertension: Secondary | ICD-10-CM

## 2017-12-29 DIAGNOSIS — I110 Hypertensive heart disease with heart failure: Secondary | ICD-10-CM | POA: Diagnosis not present

## 2017-12-29 DIAGNOSIS — Z7901 Long term (current) use of anticoagulants: Secondary | ICD-10-CM | POA: Insufficient documentation

## 2017-12-29 DIAGNOSIS — I482 Chronic atrial fibrillation, unspecified: Secondary | ICD-10-CM

## 2017-12-29 DIAGNOSIS — I272 Pulmonary hypertension, unspecified: Secondary | ICD-10-CM | POA: Insufficient documentation

## 2017-12-29 DIAGNOSIS — I712 Thoracic aortic aneurysm, without rupture: Secondary | ICD-10-CM | POA: Insufficient documentation

## 2017-12-29 DIAGNOSIS — I059 Rheumatic mitral valve disease, unspecified: Secondary | ICD-10-CM | POA: Diagnosis not present

## 2017-12-29 DIAGNOSIS — E785 Hyperlipidemia, unspecified: Secondary | ICD-10-CM | POA: Insufficient documentation

## 2017-12-29 LAB — BASIC METABOLIC PANEL
Anion gap: 6 (ref 5–15)
BUN: 23 mg/dL (ref 8–23)
CHLORIDE: 107 mmol/L (ref 98–111)
CO2: 25 mmol/L (ref 22–32)
CREATININE: 0.94 mg/dL (ref 0.61–1.24)
Calcium: 9 mg/dL (ref 8.9–10.3)
GFR calc Af Amer: >60 mL/min (ref >60)
GFR calc non Af Amer: >60 mL/min (ref >60)
GLUCOSE: 108 mg/dL — AB (ref 70–99)
POTASSIUM: 4.9 mmol/L (ref 3.5–5.1)
Sodium: 138 mmol/L (ref 135–145)

## 2017-12-29 LAB — CBC
HCT: 43.1 % (ref 39.0–52.0)
Hemoglobin: 13.5 g/dL (ref 13.0–17.0)
MCH: 29.9 pg (ref 26.0–34.0)
MCHC: 31.3 g/dL (ref 30.0–36.0)
MCV: 95.4 fL (ref 80.0–100.0)
NRBC: 0 % (ref 0.0–0.2)
PLATELETS: 251 10*3/uL (ref 150–400)
RBC: 4.52 MIL/uL (ref 4.22–5.81)
RDW: 13.7 % (ref 11.5–15.5)
WBC: 8.8 10*3/uL (ref 4.0–10.5)

## 2017-12-29 LAB — LIPID PANEL
CHOL/HDL RATIO: 3.7 ratio
CHOLESTEROL: 150 mg/dL (ref 0–200)
HDL: 41 mg/dL (ref >40)
LDL CALC: 84 mg/dL (ref 0–99)
Triglycerides: 124 mg/dL (ref <150)
VLDL: 25 mg/dL (ref 0–40)

## 2017-12-29 LAB — MAGNESIUM: MAGNESIUM: 2.4 mg/dL (ref 1.7–2.4)

## 2017-12-29 MED ORDER — FUROSEMIDE 20 MG PO TABS: 20.0000 mg | ORAL_TABLET | Freq: Every day | ORAL | 3 refills | Status: DC

## 2017-12-29 MED ORDER — POTASSIUM CHLORIDE CRYS ER 20 MEQ PO TBCR: 20.0000 meq | EXTENDED_RELEASE_TABLET | Freq: Every day | ORAL | 3 refills | Status: DC

## 2017-12-29 NOTE — Patient Instructions (Signed)
Start Lasix 20mg  (1 tab) daily.  Start Potassium 52meq (1 tab) daily.  Your physician ordered for you to have labs drawn in 10 days.  Your physician recommends that you schedule a follow-up appointment in: 2 months

## 2017-12-29 NOTE — Telephone Encounter (Signed)
Pt called with results no answer voice mail was left for pt to call back Lipids a little higher than prior, watch diet and make sure he is taking atorvastatin regularly.

## 2017-12-31 NOTE — Progress Notes (Signed)
Patient ID: [name], male   DOB: Sep 25, 1940, 77 y.o.   MRN: [id number] PCP: Dr. Maudie Mercury Cardiology: Dr. [name]  77 y.o. with history of rheumatic fever and AI/[name] s/p bioprosthetic AVR and MVR at the [name] as well as MAZE procedure all in 2006 presents for followup of valvular heart disease and atrial flutter/fibrillation.  He also has an ascending aortic aneurysm.   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  I started him on dronedarone and coumadin with plan for cardioversion after 4 wks of therapeutic anticoagulation if he did not convert back to NSR on his own.  He went back into NSR spontaneously and later stopped coumadin. Atrial flutter recurred in 3/15 and again resolved spontaneously.  In 6/15, patient was admitted with transient dysarthria (resolved in about 10 minutes).  However, he was found to have evidence for CVA by MRI.  Therefore, he was started on coumadin. He was noted to be in atypical atrial flutter again.  Ranolazine was added to dronedarone.    In 8/15, [name] was seen in consultation at the [name].  He had atrial flutter ablation at [name] in 11/15.    He was seen in followup at [name] in 2/16.  Ranolazine was stopped.  Echo in 2/16 showed EF 55% with stable bioprosthetic mitral and aortic valves.  CTA chest showed stable 5 cm ascending aortic aneurysm.  He went back into atypical atrial flutter in 4/16.  Ranolazine was restarted and he was cardioverted back to NSR.    Last visit to [name] was in 5/18.  Echo at that time showed EF 60-65% with stable bioprosthetic mitral and aortic valves.  CTA showed 5.0 cm aortic root and ascending aorta.  Cardiolite in 12/18 showed no ischemia. He wore a 30 day monitor in 12/18 with no arrhythmia.    He went into atrial flutter again during a respiratory illness in 2/19.  On 04/24/17, he was cardioverted back to NSR.  He thinks that he went  into atrial flutter again later on that day. He was back in atrial flutter at followup appointment. He was then admitted for Tikosyn loading in 4/19 and was cardioverted back to NSR after the load.   He had atrial fibrillation again in 6/19 with DCCV.   He was seen at the [name] in 9/19.  Echo showed normal LV and RV systolic function, bioprosthetic MV with mean gradient up to 9 mmHg, normal bioprosthetic AoV.  CTA chest showed aortic root up to 5.3 cm.   He returns for followup of atrial flutter and valvular disease.  He is in NSR today.  Overall feeling good. No significant exertional dyspnea and no chest pain. No orthopnea/PND. Main limitation is hip pain, needs right THR. BP his high in the office today but normally runs in the 510C systolic when he checks at home.    ECG (12/12/11): Atypical atrial flutter versus atrial tachycardia with rate around 100 bpm, QTc normal.  ECG (12/29/11): NSR, 1st degree AV block PR 242 msec.    ECG (12/13): NSR, 1st degree AV block PR 244 msec    ECG (10/14): NSR, 1st degree AV block ECG (3/15): Atypical atrial flutter with rate 87, inferior T wave inversions ECG (6/15): Atypical atrial flutter with rate 77 ECG (8/15): atypical atrial flutter rate 82 ECG (10/15): NSR, 1st degree AV block ECG (12/15): NSR, 1st degree AV block, QTc 410 msec ECG (5/16):  NSR, 1st degree AV block, QTc 419 msec ECG (5/17): NSR, 1st degree AV block 270 msec, QTc 433 ECG (2/18): NSR, 1st degree AVB, nonspecific ST-T changes, QTc 435 msec ECG (11/18): NSR, 1st degree AVB, QTc 424 msec ECG (2/19, personally reviewed): atypical atrial flutter at 87, QTc 442 msec ECG (4/19, personally reviewed): NSR, 1st degree AVB, QTc 453 msec ECG (10/19, personally reviewed): NSR, 1st degree AVB, QTc 448 msec  Labs (3/14): LDL 64, TSH normal, K 3.9, creatinine 1.0     Labs (6/15): LDL 54, HDL 48, creatinine 0.95   Labs (9/15): LDL 54, HDL 25 Labs (10/15): K 4.5, creatinine 1.3, AST 45,  ALT 64 Labs (4/16): K 4.3, creatinine 1.28, HCT 40.7 Labs (5/17): K 4.1, creatinine 1.16, LDL 66, HDL 47 Labs (2/18): LDL 42, HDL 43, K 4.4, creatinine 1.48 Labs (3/18): hgb 13.8 Labs (2/19): LDL 59 Labs (4/19): K 3.9, creatinine 0.97, Mg 2.0 Labs (6/19): K 4.4, creatinine 0.9                                                                                                                                 PMH: 1. Atrial fibrillation: s/p MAZE and LAA ligation in 2006.  No documented atrial fibrillation since operation.  2. Atypical atrial flutter: 10/13, 3/15, and again in 6/15.  Recurrent 4/16 with DCCV.  - Event monitor (12/18): no significant arrhythmia.  - Recurrence in 2/19: DCCV to NSR but quickly recurred.  - Admitted in 4/19 for Tikosyn loading and cardioverted to NSR.  - DCCV to NSR in 6/19.  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   - Echo 5/11 with EF 60-65%, normal bioprosthetic aortic valve, normal bioprosthetic mitral valve, moderate LAE.   - Echo 11/12 at [name] looked ok per report.  - Echo (10/13) with EF 55-60%, mild LVH, normal bioprosthetic aortic and mitral valves, moderate LAE, PA systolic pressure 35 mmHg.   - Echo (6/15) with EF 60%, moderate LVH, normal-appearing bioprosthetic MV and AoV, PA systolic pressure 32 mmHg.   - Echo 9/15 [name]) with EF 55%, mild LVH, PASP 52 mmHg, severe LAE, bioprosthetic mitral valve with no [name] and mean gradient 11 mmHg, moderate TR, bioprosthetic aortic valve with no AI and mean gradient 9 mmHg, dilated ascending aorta to 5 cm.   - Echo (2/16, [name]) with EF 55%, stable bioprosthetic aortic and mitral valves (AoV mean gradient 7 mmHg, MV mean gradient 4 mmHg), normal RV size and systolic function.  - Echo (12/16, [name]) with EF 58%, mild RV dilation with low normal systolic function, PA systolic pressure 34 mmHg, bioprosthetic MV  and AoV functioning normally.  - Echo (5/18, [name]) with EF 60-65%, RV normal size and systolic function, moderate LAE, bioprosthetic MV mean gradient 6 with no [name], bioprosthetic aortic valve with mean gradient 11 and no AI, PASP  41 mmHg.  - Echo (2/19): EF 60-65%, moderate LVH, bioprosthetic aortic and mitral valves functioning normally, PASP 23 mmHg, aortic root 4.5 cm.  - Echo (9/19): EF 56%, normal RV size and systolic function, bioprosthetic MV with mean gradient 9 mmHg, trivial [name], bioprosthetic aortic valve with mean gradient 12 mmHg, PASP 51 mmHg.  4. Hyperlipidemia 5. L-spine [name] 6. LHC with no significant coronary disease in 2006 pre-op.  - Cardiolite (12/18): No ischemia.  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  CTA chest 9/15 with dilated sinuses of valsalva to 5.0 cm and ascending aorta 4.9 cm. CTA chest (2/16) with 5.0 cm ascending aorta.  CTA chest (12/16) with 4.9 cm aortic root at SOV, 4.8 cm ascending aorta.  - CT chest (5/18, [name]): 5.0 cm aortic root, 5.0 cm ascending aorta.  - CTA chest (10/19, [name]): 5.3 cm aortic root, 5.2 cm ascending aorta.  9. Chronic diastolic CHF  SH: Married, retired Forensic psychologist [name], [place], [place]).  2 sons.  Never smoked.   FH: No premature CAD.   ROS: All systems reviewed and negative except as per HPI.  Current Outpatient Medications  Medication Sig Dispense Refill  . atorvastatin (LIPITOR) 80 MG tablet Take 1 tablet by mouth  daily 90 tablet 3  . clobetasol cream (TEMOVATE) 5.80 % Apply 1 application topically daily as needed (skin irritation).     . Coenzyme Q10 (CO Q-10) 300 MG CAPS Take 300 mg by mouth daily.    . Cyanocobalamin (VITAMIN B-12) 5000 MCG SUBL Place 5,000 mcg under the tongue daily.    [name] docusate sodium (COLACE) 100 MG capsule Take 100 mg by mouth daily.    [name] dofetilide (TIKOSYN) 500  MCG capsule Take 1 capsule (500 mcg total) by mouth 2 (two) times daily. 14 capsule 1  . fish oil-omega-3 fatty acids 1000 MG capsule Take 1 g by mouth 2 (two) times daily.     . Glucosamine-Chondroitin (COSAMIN DS PO) Take 1 tablet by mouth 2 (two) times daily.    . hydrocortisone (ANUSOL-HC) 2.5 % rectal cream Place 1 application rectally 2 (two) times daily. (Patient taking differently: Place 1 application rectally 2 (two) times daily as needed for hemorrhoids. ) 30 g 0  . loratadine (CLARITIN) 10 MG tablet Take 10 mg by mouth daily as needed for allergies.     [name] losartan (COZAAR) 25 MG tablet Take 1 tablet (25 mg total) by mouth daily. 90 tablet 3  . metoprolol succinate (TOPROL-XL) 50 MG 24 hr tablet Take 75 mg by mouth 2 (two) times daily. Take with or immediately following a meal.     . Multiple Vitamins-Minerals (MULTIVITAL-M) TABS Take by mouth.    . Probiotic Product (PROBIOTIC FORMULA PO) Take 1 capsule by mouth 2 (two) times daily.     . sildenafil (VIAGRA) 100 MG tablet Take 1 tablet (100 mg total) by mouth as needed for erectile dysfunction. 10 tablet 6  . warfarin (COUMADIN) 5 MG tablet Take as directed by [name] 120 tablet 1  . furosemide (LASIX) 20 MG tablet Take 1 tablet (20 mg total) by mouth daily. 30 tablet 3  . potassium chloride SA (K-DUR,KLOR-CON) 20 MEQ tablet Take 1 tablet (20 mEq total) by mouth daily. 30 tablet 3   No current facility-administered medications for this encounter.     BP (!) 142/86   Pulse 67   Wt 92 kg (202 lb  12.8 oz)   SpO2 96%   BMI 30.84 kg/m  General: NAD Neck: JVP 8 cm with HJR, no thyromegaly or thyroid nodule.  Lungs: Clear to auscultation bilaterally with normal respiratory effort. CV: Nondisplaced PMI.  Heart regular S1/S2, no S3/S4, 2/6 early SEM RUSB.  1+ ankle edema.  No carotid bruit.  Normal pedal pulses.  Abdomen: Soft, nontender, no hepatosplenomegaly, no distention.  Skin: Intact without lesions or rashes.   Neurologic: Alert and oriented x 3.  Psych: Normal affect. Extremities: No clubbing or cyanosis.  HEENT: Normal.   Assessment/Plan: 1. Atypical atrial flutter: Patient had a MAZE procedure with LAA ligation at the time of his valve replacement.  Since then, he has had occasional episodes of atypical atrial flutter. He had episodes in 10/13 and 3/15 that converted back to NSR on dronedarone and ranolazine. He was noted to be back in atrial flutter in 6/15 in the setting of a CVA.  He had ablation at [name] in 11/15.  Atrial flutter recurred after ranolazine was stopped and he had DCCV again in 4/16.  Atrial flutter recurred in 2/19, DCCV back to NSR but atrial flutter quickly recurred. Tikosyn was then loaded and he was cardioverted back to NSR in 4/19.  He required DCCV again in 6/19.  He remains in NSR today.  - Continue coumadin.  - Continue current Toprol XL. - Continue Tikosyn.  QTc ok on ECG today. Check BMET/Mg.  2. 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  Last echo at [name] in 9/19 showed normal bioprosthetic aortic valve, but mean gradient was up to 9 mmHg across the mitral valve.  If the aortic root/ascending aorta have to be replaced, will likely need both valves replaced.  3. HTN: BP needs good control given ascending aortic aneurysm. BP high today but rushed in, SBP 120s when he checks at home.  - Continue losartan and Toprol XL.  - I asked him to check BP daily for a couple of weeks and call us if SBP is consistently > 130.    4. Hyperlipidemia: Check lipids today.     5. CVA: Suspect this was related to atrial flutter (despite LA appendage ligation).  He is now on coumadin. No residual neurologic deficits.  Continue long-term anticoagulation. - If has to stop coumadin in the future, should get Lovenox bridging.  6. Thoracic aortic aneurysm: Dilated aortic root at sinuses of valsalva and ascending aorta to 5.3 cm, last CTA in 9/19.    - Close followup, may need aortic root/ascending aorta replacement if ascending aorta expands further.  Getting serial CTs at [name].    - Continue Toprol XL given some evidence that beta blockers may attenuate thoracic aortic aneurysm growth.  7. Chronic diastolic CHF: Patient is volume overloaded on exam.  Weight is up 3 lbs. Moderate pulmonary hypertension on recent echo, likely pulmonary venous hypertension.  - Start Lasix 20 mg daily along with KCl 20 mEq daily.  BMET today and again in 10 days.    Followup 2 months.    Loralie Champagne 12/31/2017

## 2018-01-01 DIAGNOSIS — R262 Difficulty in walking, not elsewhere classified: Secondary | ICD-10-CM | POA: Diagnosis not present

## 2018-01-01 DIAGNOSIS — M6281 Muscle weakness (generalized): Secondary | ICD-10-CM | POA: Diagnosis not present

## 2018-01-01 DIAGNOSIS — M1611 Unilateral primary osteoarthritis, right hip: Secondary | ICD-10-CM | POA: Diagnosis not present

## 2018-01-01 DIAGNOSIS — M25551 Pain in right hip: Secondary | ICD-10-CM | POA: Diagnosis not present

## 2018-01-03 ENCOUNTER — Telehealth (HOSPITAL_COMMUNITY): Payer: Self-pay | Admitting: *Deleted

## 2018-01-03 NOTE — Telephone Encounter (Signed)
Pt called in stating he went back in AF overnight. His HR is controlled in the 70-90s just feels fatigued. Pt is planning to go out of town for about 10 days as of this weekend. Will start weekly INR checks tomorrow as well as check BMET/MG since newly started on lasix daily. Per Roderic Palau NP can start INRs weekly to plan for cardioversion. Pt has historically converted to NSR while awaiting cardioversion. Pt in agreement.

## 2018-01-04 ENCOUNTER — Telehealth (HOSPITAL_COMMUNITY): Payer: Self-pay

## 2018-01-04 ENCOUNTER — Encounter (HOSPITAL_COMMUNITY): Payer: Self-pay | Admitting: Nurse Practitioner

## 2018-01-04 ENCOUNTER — Encounter: Payer: Self-pay | Admitting: Allergy and Immunology

## 2018-01-04 ENCOUNTER — Telehealth: Payer: Self-pay | Admitting: *Deleted

## 2018-01-04 ENCOUNTER — Ambulatory Visit (HOSPITAL_COMMUNITY)
Admission: RE | Admit: 2018-01-04 | Discharge: 2018-01-04 | Disposition: A | Discharge status: Home / Self Care | Payer: Medicare Other | Source: Ambulatory Visit | Attending: Nurse Practitioner | Admitting: Nurse Practitioner

## 2018-01-04 ENCOUNTER — Telehealth (INDEPENDENT_AMBULATORY_CARE_PROVIDER_SITE_OTHER): Payer: Self-pay | Admitting: Orthopaedic Surgery

## 2018-01-04 DIAGNOSIS — I4439 Other atrioventricular block: Secondary | ICD-10-CM | POA: Insufficient documentation

## 2018-01-04 DIAGNOSIS — I4892 Unspecified atrial flutter: Secondary | ICD-10-CM | POA: Diagnosis not present

## 2018-01-04 LAB — BASIC METABOLIC PANEL
Anion gap: 11 (ref 5–15)
BUN: 25 mg/dL — AB (ref 8–23)
CHLORIDE: 104 mmol/L (ref 98–111)
CO2: 24 mmol/L (ref 22–32)
CREATININE: 1.26 mg/dL — AB (ref 0.61–1.24)
Calcium: 9.3 mg/dL (ref 8.9–10.3)
GFR calc Af Amer: >60 mL/min (ref >60)
GFR calc non Af Amer: 54 mL/min — ABNORMAL LOW (ref >60)
GLUCOSE: 168 mg/dL — AB (ref 70–99)
POTASSIUM: 4.3 mmol/L (ref 3.5–5.1)
Sodium: 139 mmol/L (ref 135–145)

## 2018-01-04 LAB — PROTIME-INR
INR: 1.9
Prothrombin Time: 21.6 seconds — ABNORMAL HIGH (ref 11.4–15.2)

## 2018-01-04 LAB — MAGNESIUM: MAGNESIUM: 2.3 mg/dL (ref 1.7–2.4)

## 2018-01-04 NOTE — Telephone Encounter (Signed)
Patient called wanting to check status of his request for records to go to [facility]. I  Called him back and lmvm advised records were faxed 10/30 and I received confirmation that fax went through.

## 2018-01-04 NOTE — Telephone Encounter (Signed)
Called patient, advised paper work was completed and signed by Dr. Marigene Ehlers.  Pt will pick up paper work this afternoon.  Copy of paper work to be scanned into patient chart.

## 2018-01-04 NOTE — Telephone Encounter (Signed)
Called pt and informed him that he needs weekly appts for INR checks per AFIB clinic. Pt was willing to move appt to 01/16/18 from 01/17/18, pt aware following appts will be set at next appts in the office.

## 2018-01-04 NOTE — Progress Notes (Signed)
Pt called to clinic yesterday for feeling he was out of rhythm. He is planning to go out of town for a week in a couple of days. He is rate controlled today at 96 bpm, with qtc at 432 ms. Will check bmet as he recently increased diuretics. WIll begin weekly INR's if he does require cardioversion. He will have another  INR  when he returns to town 11/12.

## 2018-01-05 ENCOUNTER — Ambulatory Visit (INDEPENDENT_AMBULATORY_CARE_PROVIDER_SITE_OTHER): Payer: Medicare Other

## 2018-01-05 DIAGNOSIS — Z7901 Long term (current) use of anticoagulants: Secondary | ICD-10-CM | POA: Insufficient documentation

## 2018-01-05 DIAGNOSIS — Z5181 Encounter for therapeutic drug level monitoring: Secondary | ICD-10-CM

## 2018-01-05 DIAGNOSIS — I4892 Unspecified atrial flutter: Secondary | ICD-10-CM | POA: Diagnosis not present

## 2018-01-05 NOTE — Patient Instructions (Signed)
Description   Pt had INR checked at afib clinic, spoke with pt advised to increase Coumadin dosage to 1.5 tablets daily except 1 tablet on Tuesdays, Thursdays and Sundays. Recheck INR in 1 week.  Pt needs weekly checks pending possible DCCV.   Call and let us know if scheduled for any procedures [phone] or medication changes.

## 2018-01-16 ENCOUNTER — Ambulatory Visit (HOSPITAL_COMMUNITY)
Admission: RE | Admit: 2018-01-16 | Discharge: 2018-01-16 | Disposition: A | Discharge status: Home / Self Care | Payer: Medicare Other | Source: Ambulatory Visit | Attending: Internal Medicine | Admitting: Internal Medicine

## 2018-01-16 ENCOUNTER — Ambulatory Visit (INDEPENDENT_AMBULATORY_CARE_PROVIDER_SITE_OTHER): Payer: Medicare Other | Admitting: *Deleted

## 2018-01-16 DIAGNOSIS — I10 Essential (primary) hypertension: Secondary | ICD-10-CM | POA: Diagnosis not present

## 2018-01-16 DIAGNOSIS — Z7901 Long term (current) use of anticoagulants: Secondary | ICD-10-CM

## 2018-01-16 DIAGNOSIS — Z5181 Encounter for therapeutic drug level monitoring: Secondary | ICD-10-CM

## 2018-01-16 DIAGNOSIS — I482 Chronic atrial fibrillation, unspecified: Secondary | ICD-10-CM | POA: Diagnosis not present

## 2018-01-16 DIAGNOSIS — I4892 Unspecified atrial flutter: Secondary | ICD-10-CM | POA: Diagnosis not present

## 2018-01-16 LAB — BASIC METABOLIC PANEL
ANION GAP: 7 (ref 5–15)
BUN: 20 mg/dL (ref 8–23)
CALCIUM: 9 mg/dL (ref 8.9–10.3)
CHLORIDE: 107 mmol/L (ref 98–111)
CO2: 25 mmol/L (ref 22–32)
Creatinine, Ser: 0.92 mg/dL (ref 0.61–1.24)
GFR calc Af Amer: >60 mL/min (ref >60)
GFR calc non Af Amer: >60 mL/min (ref >60)
GLUCOSE: 119 mg/dL — AB (ref 70–99)
Potassium: 4.3 mmol/L (ref 3.5–5.1)
Sodium: 139 mmol/L (ref 135–145)

## 2018-01-16 LAB — POCT INR: INR: 3.2 — AB (ref 2.0–3.0)

## 2018-01-16 NOTE — Patient Instructions (Signed)
Description   Today only take 1/2 tablet, then continue taking 1.5 tablets daily except 1 tablet on Tuesdays, Thursdays and Sundays. Recheck INR in 1 week.  Pt needs weekly checks pending possible DCCV.   Call and let us know if scheduled for any procedures [phone] or medication changes.

## 2018-01-23 ENCOUNTER — Ambulatory Visit (INDEPENDENT_AMBULATORY_CARE_PROVIDER_SITE_OTHER): Payer: Medicare Other | Admitting: *Deleted

## 2018-01-23 DIAGNOSIS — Z5181 Encounter for therapeutic drug level monitoring: Secondary | ICD-10-CM

## 2018-01-23 DIAGNOSIS — Z7901 Long term (current) use of anticoagulants: Secondary | ICD-10-CM

## 2018-01-23 DIAGNOSIS — I482 Chronic atrial fibrillation, unspecified: Secondary | ICD-10-CM | POA: Diagnosis not present

## 2018-01-23 DIAGNOSIS — I4892 Unspecified atrial flutter: Secondary | ICD-10-CM

## 2018-01-23 LAB — POCT INR: INR: 2.3 (ref 2.0–3.0)

## 2018-01-23 NOTE — Patient Instructions (Signed)
Description   Continue taking 1.5 tablets daily except 1 tablet on Tuesdays, Thursdays and Sundays. Recheck INR in 1 week.  Pt needs weekly checks pending possible DCCV.   Call and let us know if scheduled for any procedures [phone] or medication changes.

## 2018-01-29 ENCOUNTER — Other Ambulatory Visit (HOSPITAL_COMMUNITY): Payer: Self-pay | Admitting: Cardiology

## 2018-01-29 ENCOUNTER — Ambulatory Visit (INDEPENDENT_AMBULATORY_CARE_PROVIDER_SITE_OTHER): Payer: Medicare Other

## 2018-01-29 DIAGNOSIS — I482 Chronic atrial fibrillation, unspecified: Secondary | ICD-10-CM

## 2018-01-29 DIAGNOSIS — I4892 Unspecified atrial flutter: Secondary | ICD-10-CM

## 2018-01-29 DIAGNOSIS — Z5181 Encounter for therapeutic drug level monitoring: Secondary | ICD-10-CM | POA: Diagnosis not present

## 2018-01-29 LAB — POCT INR: INR: 3 (ref 2.0–3.0)

## 2018-01-29 NOTE — Patient Instructions (Signed)
Description   Continue taking 1.5 tablets daily except 1 tablet on Tuesdays, Thursdays and Sundays. Recheck INR in 1 week.  Pt needs weekly checks pending possible DCCV.   Call and let us know if scheduled for any procedures [phone] or medication changes.

## 2018-02-06 ENCOUNTER — Ambulatory Visit (HOSPITAL_COMMUNITY)
Admission: RE | Admit: 2018-02-06 | Discharge: 2018-02-06 | Disposition: A | Discharge status: Home / Self Care | Payer: Medicare Other | Source: Ambulatory Visit | Attending: Nurse Practitioner | Admitting: Nurse Practitioner

## 2018-02-06 ENCOUNTER — Ambulatory Visit (INDEPENDENT_AMBULATORY_CARE_PROVIDER_SITE_OTHER): Payer: Medicare Other | Admitting: *Deleted

## 2018-02-06 ENCOUNTER — Encounter (HOSPITAL_COMMUNITY): Payer: Self-pay | Admitting: Nurse Practitioner

## 2018-02-06 VITALS — BP 116/80 | HR 82 | Ht 68.0 in | Wt 203.0 lb

## 2018-02-06 DIAGNOSIS — Z886 Allergy status to analgesic agent status: Secondary | ICD-10-CM | POA: Diagnosis not present

## 2018-02-06 DIAGNOSIS — Z8673 Personal history of transient ischemic attack (TIA), and cerebral infarction without residual deficits: Secondary | ICD-10-CM | POA: Diagnosis not present

## 2018-02-06 DIAGNOSIS — I484 Atypical atrial flutter: Secondary | ICD-10-CM | POA: Diagnosis not present

## 2018-02-06 DIAGNOSIS — Z952 Presence of prosthetic heart valve: Secondary | ICD-10-CM | POA: Insufficient documentation

## 2018-02-06 DIAGNOSIS — Z5181 Encounter for therapeutic drug level monitoring: Secondary | ICD-10-CM | POA: Diagnosis not present

## 2018-02-06 DIAGNOSIS — I4892 Unspecified atrial flutter: Secondary | ICD-10-CM | POA: Diagnosis not present

## 2018-02-06 DIAGNOSIS — Z79899 Other long term (current) drug therapy: Secondary | ICD-10-CM | POA: Diagnosis not present

## 2018-02-06 DIAGNOSIS — Z7901 Long term (current) use of anticoagulants: Secondary | ICD-10-CM | POA: Diagnosis not present

## 2018-02-06 DIAGNOSIS — I482 Chronic atrial fibrillation, unspecified: Secondary | ICD-10-CM | POA: Diagnosis not present

## 2018-02-06 DIAGNOSIS — I4819 Other persistent atrial fibrillation: Secondary | ICD-10-CM | POA: Insufficient documentation

## 2018-02-06 LAB — CBC
HCT: 42.4 % (ref 39.0–52.0)
Hemoglobin: 13.9 g/dL (ref 13.0–17.0)
MCH: 30.8 pg (ref 26.0–34.0)
MCHC: 32.8 g/dL (ref 30.0–36.0)
MCV: 94 fL (ref 80.0–100.0)
NRBC: 0 % (ref 0.0–0.2)
PLATELETS: 236 10*3/uL (ref 150–400)
RBC: 4.51 MIL/uL (ref 4.22–5.81)
RDW: 13.4 % (ref 11.5–15.5)
WBC: 8.5 10*3/uL (ref 4.0–10.5)

## 2018-02-06 LAB — BASIC METABOLIC PANEL
ANION GAP: 9 (ref 5–15)
BUN: 16 mg/dL (ref 8–23)
CHLORIDE: 106 mmol/L (ref 98–111)
CO2: 22 mmol/L (ref 22–32)
Calcium: 9.1 mg/dL (ref 8.9–10.3)
Creatinine, Ser: 1.13 mg/dL (ref 0.61–1.24)
GFR calc Af Amer: >60 mL/min (ref >60)
Glucose, Bld: 114 mg/dL — ABNORMAL HIGH (ref 70–99)
POTASSIUM: 4.2 mmol/L (ref 3.5–5.1)
SODIUM: 137 mmol/L (ref 135–145)

## 2018-02-06 LAB — POCT INR: INR: 2.8 (ref 2.0–3.0)

## 2018-02-06 LAB — MAGNESIUM: MAGNESIUM: 2.2 mg/dL (ref 1.7–2.4)

## 2018-02-06 NOTE — Patient Instructions (Signed)
Description   Continue taking 1.5 tablets daily except 1 tablet on Tuesdays, Thursdays and Sundays. Recheck INR in 1 week.  Pt needs weekly checks pending possible DCCV.   Call and let us know if scheduled for any procedures [phone] or medication changes.

## 2018-02-06 NOTE — Progress Notes (Addendum)
Primary Care Physician: [name], MD Referring Physician: Dr. Lynn Ito [name] [name] is a 77 y.o. 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 He had successful cardioversion, 6/20, and had f/u in [facility] in September to discuss further surgery/ ablation for persistent afib.    He is in the afib clinic to get set up for cardioversion for return of atrial flutter at the end of October. He now has had 4 weekly INR's and will schedule. He does continue on dofetilide.  Today, he denies symptoms of palpitations, chest pain, shortness of breath, orthopnea, PND, lower extremity edema, dizziness, presyncope, syncope, or neurologic sequela. The patient is tolerating medications without difficulties and is otherwise without complaint today.   Past Medical History:  Diagnosis Date  . Acute rheumatic heart disease, unspecified    childhood, age  46 & 55  . Acute rheumatic pericarditis   . Atrial fibrillation ([place])    history  . Diverticulosis   . 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 External hemorrhoids without mention of complication   . 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 Lesion of ulnar nerve   . 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 Other and unspecified hyperlipidemia   . Previous back surgery 1978, jan 2007  . Psychosexual dysfunction with inhibited sexual excitement   . SOB (shortness of breath)    "with heavy exercise"  . Stroke [facility])    Past Surgical History:  Procedure Laterality Date  . AORTIC AND MITRAL VALVE REPLACEMENT     09/2004  . CARDIOVERSION     3 times from 2004-2006  . CARDIOVERSION N/A 09/26/2013   Procedure: CARDIOVERSION;  Surgeon: Larey Dresser, MD;  Location: [place];  Service: Cardiovascular;  Laterality: N/A;  . CARDIOVERSION N/A 06/19/2014   Procedure: CARDIOVERSION;  Surgeon: Jerline Pain, MD;  Location: [place];   Service: Cardiovascular;  Laterality: N/A;  . CARDIOVERSION N/A 04/24/2017   Procedure: CARDIOVERSION;  Surgeon: Larey Dresser, MD;  Location: [facility] ENDOSCOPY;  Service: Cardiovascular;  Laterality: N/A;  . CARDIOVERSION N/A 06/08/2017   Procedure: CARDIOVERSION;  Surgeon: Pixie Casino, MD;  Location: [facility] ENDOSCOPY;  Service: Cardiovascular;  Laterality: N/A;  . CARDIOVERSION N/A 08/24/2017   Procedure: CARDIOVERSION;  Surgeon: Skeet Latch, MD;  Location: [place];  Service: Cardiovascular;  Laterality: N/A;  . COLONOSCOPY    . laminectomies     10/1975 and in 03/2005  . TONSILLECTOMY     1950    Current Outpatient Medications  Medication Sig Dispense Refill  . atorvastatin (LIPITOR) 80 MG tablet Take 1 tablet by mouth  daily 90 tablet 3  . clobetasol cream (TEMOVATE) 4.65 % Apply 1 application topically daily as needed (skin irritation).     . Coenzyme Q10 (CO Q-10) 300 MG CAPS Take 300 mg by mouth daily.    . Cyanocobalamin (VITAMIN B-12) 5000 MCG SUBL Place 5,000 mcg under the tongue daily.    [name] docusate sodium (COLACE) 100 MG capsule Take 100 mg by mouth daily.    [name] dofetilide (TIKOSYN) 500 MCG capsule Take 1 capsule (500 mcg total) by mouth 2 (two) times daily. 14 capsule 1  . fish oil-omega-3 fatty acids 1000 MG capsule Take 1 g by mouth 2 (two) times daily.     . furosemide (LASIX) 20 MG tablet Take  1 tablet (20 mg total) by mouth daily. 30 tablet 3  . Glucosamine-Chondroitin (COSAMIN DS PO) Take 1 tablet by mouth 2 (two) times daily.    . hydrocortisone (ANUSOL-HC) 2.5 % rectal cream Place 1 application rectally 2 (two) times daily. (Patient taking differently: Place 1 application rectally 2 (two) times daily as needed for hemorrhoids. ) 30 g 0  . loratadine (CLARITIN) 10 MG tablet Take 10 mg by mouth daily as needed for allergies.     [name] losartan (COZAAR) 25 MG tablet TAKE 1 TABLET DAILY 90 tablet 3  . metoprolol succinate (TOPROL-XL) 50 MG 24 hr tablet Take 75 mg by  mouth 2 (two) times daily. Take with or immediately following a meal.     . Multiple Vitamins-Minerals (MULTIVITAL-M) TABS Take by mouth.    . potassium chloride SA (K-DUR,KLOR-CON) 20 MEQ tablet Take 1 tablet (20 mEq total) by mouth daily. 30 tablet 3  . Probiotic Product (PROBIOTIC FORMULA PO) Take 1 capsule by mouth 2 (two) times daily.     . sildenafil (VIAGRA) 100 MG tablet Take 1 tablet (100 mg total) by mouth as needed for erectile dysfunction. 10 tablet 6  . warfarin (COUMADIN) 5 MG tablet Take as directed by [facility] 120 tablet 1   No current facility-administered medications for this encounter.     Allergies  Allergen Reactions  . Naproxen Hives and Other (See Comments)    Aleve    Social History   Socioeconomic History  . Marital status: Married    Spouse name: Not on file  . Number of children: 2  . Years of education: BS  . Highest education level: Not on file  Occupational History  . Occupation: retired  Scientific laboratory technician  . Financial resource strain: Not on file  . Food insecurity:    Worry: Not on file    Inability: Not on file  . Transportation needs:    Medical: Not on file    Non-medical: Not on file  Tobacco Use  . Smoking status: Never Smoker  . Smokeless tobacco: Never Used  Substance and Sexual Activity  . Alcohol use: Yes    Alcohol/week: 3.0 standard drinks    Types: 1 Glasses of wine, 1 Cans of beer, 1 Shots of liquor per week    Comment: wine occasionally  . Drug use: No  . Sexual activity: Yes    Partners: Female  Lifestyle  . Physical activity:    Days per week: Not on file    Minutes per session: Not on file  . Stress: Not on file  Relationships  . Social connections:    Talks on phone: Not on file    Gets together: Not on file    Attends religious service: Not on file    Active member of club or organization: Not on file    Attends meetings of clubs or organizations: Not on file    Relationship status: Not on file  . Intimate  partner violence:    Fear of current or ex partner: Not on file    Emotionally abused: Not on file    Physically abused: Not on file    Forced sexual activity: Not on file  Other Topics Concern  . Not on file  Social History Narrative   [name] - BS, [place]. married - '67. 2 sons - '74, '77  one son is a Nurse, learning disability for [place]; 4 grandchildren. 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 marriage in good health. End-of-life: discussed issues and provided packet with the MOST form and out of facility order.    Family History  Problem Relation Age of Onset  . Macular degeneration Mother        macular degeneration  . Cancer Father        intestinal/GI  . Diabetes Paternal Aunt   . Heart attack Maternal Grandfather   . Diabetes Brother     ROS- All systems are reviewed and negative except as per the HPI above  Physical Exam: Vitals:   02/06/18 1332  BP: 116/80  Pulse: 82  Weight: 92.1 kg  Height: 5\' 8"  (1.727 m)   Wt Readings from Last 3 Encounters:  02/06/18 92.1 kg  01/04/18 91.1 kg  12/29/17 92 kg    Labs: Lab Results  Component Value Date   NA 139 01/16/2018   K 4.3 01/16/2018   CL 107 01/16/2018   CO2 25 01/16/2018   GLUCOSE 119 (H) 01/16/2018   BUN 20 01/16/2018   CREATININE 0.92 01/16/2018   CALCIUM 9.0 01/16/2018   MG 2.3 01/04/2018   Lab Results  Component Value Date   INR 2.8 02/06/2018   Lab Results  Component Value Date   CHOL 150 12/29/2017   HDL 41 12/29/2017   LDLCALC 84 12/29/2017   TRIG 124 12/29/2017     GEN- The patient is well appearing, alert and oriented x 3 today.   Head- normocephalic, atraumatic Eyes-  Sclera clear, conjunctiva pink Ears- hearing intact Oropharynx- clear Neck- supple, no JVP Lymph- no cervical lymphadenopathy Lungs- Clear to ausculation bilaterally, normal work of breathing Heart- irregular rate and rhythm, no murmurs, rubs or  gallops, PMI not laterally displaced GI- soft, NT, ND, + BS Extremities- no clubbing, cyanosis, or edema MS- no significant deformity or atrophy Skin- no rash or lesion Psych- euthymic mood, full affect Neuro- strength and sensation are intact  EKG-atrial flutter with variable AV block at 82 bpm, qrs int 106 ms, qtc 455 ms Epic records reviewed    Assessment and Plan: 1. Persistent atrial fibrillation with atypical atrial flutter, severe LA enlargement, prior AVR/MVR and MAZE as well as AF ablation at [facility] Has returned to atrial flutter since the end of October despite use of Tikosyn  and has now had 4 weekly therapeutic INR's and can  proceed with cardioversion. Will need INR am of cardioversion  Continue coumadin for CHADS2VASC of 4  Scheduled 12 /11 with Dr. [name] for cardioversion F/u with Dr. [name] 1/10 2019  Butch Penny C. Montravious Weigelt, [facility] [address] [place], [place] [address] [phone]

## 2018-02-06 NOTE — Patient Instructions (Signed)
Cardioversion scheduled for Wednesday, December 11th  -Have INR drawn prior to presenting to admissions.  - Arrive at the [facility] and go to admitting at 12:45PM  -Do not eat or drink anything after midnight the night prior to your procedure.  - Take all your medication with a sip of water prior to arrival.  - You will not be able to drive home after your procedure.

## 2018-02-06 NOTE — H&P (View-Only) (Signed)
Primary Care Physician: [name], MD Referring Physician: Dr. Lynn Ito [name] [name] is a 77 y.o. male with a h/o persistent atrial fibrillation with atypical atrial flutter, severe LA enlargement, prior AVR/MVR and MAZE as well as AF ablation at [facility]. He had successful cardioversion, 6/20, and had f/u in [facility] in September to discuss further surgery/ ablation for persistent afib.    He is in the afib clinic to get set up for cardioversion for return of atrial flutter at the end of October. He now has had 4 weekly INR's and will schedule. He does continue on dofetilide.  Today, he denies symptoms of palpitations, chest pain, shortness of breath, orthopnea, PND, lower extremity edema, dizziness, presyncope, syncope, or neurologic sequela. The patient is tolerating medications without difficulties and is otherwise without complaint today.   Past Medical History:  Diagnosis Date  . Acute rheumatic heart disease, unspecified    childhood, age  8 & 31  . Acute rheumatic pericarditis   . Atrial fibrillation ([place])    history  . Diverticulosis   . 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 External hemorrhoids without mention of complication   . 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 Lesion of ulnar nerve   . 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 Other and unspecified hyperlipidemia   . Previous back surgery 1978, jan 2007  . Psychosexual dysfunction with inhibited sexual excitement   . SOB (shortness of breath)    "with heavy exercise"  . Stroke [facility])    Past Surgical History:  Procedure Laterality Date  . AORTIC AND MITRAL VALVE REPLACEMENT     09/2004  . CARDIOVERSION     3 times from 2004-2006  . CARDIOVERSION N/A 09/26/2013   Procedure: CARDIOVERSION;  Surgeon: Larey Dresser, MD;  Location: [place];  Service: Cardiovascular;  Laterality: N/A;  . CARDIOVERSION N/A 06/19/2014   Procedure: CARDIOVERSION;  Surgeon: Jerline Pain, MD;  Location: [place];   Service: Cardiovascular;  Laterality: N/A;  . CARDIOVERSION N/A 04/24/2017   Procedure: CARDIOVERSION;  Surgeon: Larey Dresser, MD;  Location: [facility] ENDOSCOPY;  Service: Cardiovascular;  Laterality: N/A;  . CARDIOVERSION N/A 06/08/2017   Procedure: CARDIOVERSION;  Surgeon: Pixie Casino, MD;  Location: [facility] ENDOSCOPY;  Service: Cardiovascular;  Laterality: N/A;  . CARDIOVERSION N/A 08/24/2017   Procedure: CARDIOVERSION;  Surgeon: Skeet Latch, MD;  Location: [place];  Service: Cardiovascular;  Laterality: N/A;  . COLONOSCOPY    . laminectomies     10/1975 and in 03/2005  . TONSILLECTOMY     1950    Current Outpatient Medications  Medication Sig Dispense Refill  . atorvastatin (LIPITOR) 80 MG tablet Take 1 tablet by mouth  daily 90 tablet 3  . clobetasol cream (TEMOVATE) 2.87 % Apply 1 application topically daily as needed (skin irritation).     . Coenzyme Q10 (CO Q-10) 300 MG CAPS Take 300 mg by mouth daily.    . Cyanocobalamin (VITAMIN B-12) 5000 MCG SUBL Place 5,000 mcg under the tongue daily.    [name] docusate sodium (COLACE) 100 MG capsule Take 100 mg by mouth daily.    [name] dofetilide (TIKOSYN) 500 MCG capsule Take 1 capsule (500 mcg total) by mouth 2 (two) times daily. 14 capsule 1  . fish oil-omega-3 fatty acids 1000 MG capsule Take 1 g by mouth 2 (two) times daily.     . furosemide (LASIX) 20 MG tablet Take  1 tablet (20 mg total) by mouth daily. 30 tablet 3  . Glucosamine-Chondroitin (COSAMIN DS PO) Take 1 tablet by mouth 2 (two) times daily.    . hydrocortisone (ANUSOL-HC) 2.5 % rectal cream Place 1 application rectally 2 (two) times daily. (Patient taking differently: Place 1 application rectally 2 (two) times daily as needed for hemorrhoids. ) 30 g 0  . loratadine (CLARITIN) 10 MG tablet Take 10 mg by mouth daily as needed for allergies.     [name] losartan (COZAAR) 25 MG tablet TAKE 1 TABLET DAILY 90 tablet 3  . metoprolol succinate (TOPROL-XL) 50 MG 24 hr tablet Take 75 mg by  mouth 2 (two) times daily. Take with or immediately following a meal.     . Multiple Vitamins-Minerals (MULTIVITAL-M) TABS Take by mouth.    . potassium chloride SA (K-DUR,KLOR-CON) 20 MEQ tablet Take 1 tablet (20 mEq total) by mouth daily. 30 tablet 3  . Probiotic Product (PROBIOTIC FORMULA PO) Take 1 capsule by mouth 2 (two) times daily.     . sildenafil (VIAGRA) 100 MG tablet Take 1 tablet (100 mg total) by mouth as needed for erectile dysfunction. 10 tablet 6  . warfarin (COUMADIN) 5 MG tablet Take as directed by [facility]madin Clinic 120 tablet 1   No current facility-administered medications for this encounter.     Allergies  Allergen Reactions  . Naproxen Hives and Other (See Comments)    Aleve    Social History   Socioeconomic History  . Marital status: Married    Spouse name: Not on file  . Number of children: 2  . Years of education: BS  . Highest education level: Not on file  Occupational History  . Occupation: retired  Scientific laboratory technician  . Financial resource strain: Not on file  . Food insecurity:    Worry: Not on file    Inability: Not on file  . Transportation needs:    Medical: Not on file    Non-medical: Not on file  Tobacco Use  . Smoking status: Never Smoker  . Smokeless tobacco: Never Used  Substance and Sexual Activity  . Alcohol use: Yes    Alcohol/week: 3.0 standard drinks    Types: 1 Glasses of wine, 1 Cans of beer, 1 Shots of liquor per week    Comment: wine occasionally  . Drug use: No  . Sexual activity: Yes    Partners: Female  Lifestyle  . Physical activity:    Days per week: Not on file    Minutes per session: Not on file  . Stress: Not on file  Relationships  . Social connections:    Talks on phone: Not on file    Gets together: Not on file    Attends religious service: Not on file    Active member of club or organization: Not on file    Attends meetings of clubs or organizations: Not on file    Relationship status: Not on file  . Intimate  partner violence:    Fear of current or ex partner: Not on file    Emotionally abused: Not on file    Physically abused: Not on file    Forced sexual activity: Not on file  Other Topics Concern  . Not on file  Social History Narrative   [name] - BS, [place]. married - '67. 2 sons - '74, '77  one son is a Nurse, learning disability for [place]; 4 grandchildren. 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 marriage in good health. End-of-life: discussed issues and provided packet with the MOST form and out of facility order.    Family History  Problem Relation Age of Onset  . Macular degeneration Mother        macular degeneration  . Cancer Father        intestinal/GI  . Diabetes Paternal Aunt   . Heart attack Maternal Grandfather   . Diabetes Brother     ROS- All systems are reviewed and negative except as per the HPI above  Physical Exam: Vitals:   02/06/18 1332  BP: 116/80  Pulse: 82  Weight: 92.1 kg  Height: 5\' 8"  (1.727 m)   Wt Readings from Last 3 Encounters:  02/06/18 92.1 kg  01/04/18 91.1 kg  12/29/17 92 kg    Labs: Lab Results  Component Value Date   NA 139 01/16/2018   K 4.3 01/16/2018   CL 107 01/16/2018   CO2 25 01/16/2018   GLUCOSE 119 (H) 01/16/2018   BUN 20 01/16/2018   CREATININE 0.92 01/16/2018   CALCIUM 9.0 01/16/2018   MG 2.3 01/04/2018   Lab Results  Component Value Date   INR 2.8 02/06/2018   Lab Results  Component Value Date   CHOL 150 12/29/2017   HDL 41 12/29/2017   LDLCALC 84 12/29/2017   TRIG 124 12/29/2017     GEN- The patient is well appearing, alert and oriented x 3 today.   Head- normocephalic, atraumatic Eyes-  Sclera clear, conjunctiva pink Ears- hearing intact Oropharynx- clear Neck- supple, no JVP Lymph- no cervical lymphadenopathy Lungs- Clear to ausculation bilaterally, normal work of breathing Heart- irregular rate and rhythm, no murmurs, rubs or  gallops, PMI not laterally displaced GI- soft, NT, ND, + BS Extremities- no clubbing, cyanosis, or edema MS- no significant deformity or atrophy Skin- no rash or lesion Psych- euthymic mood, full affect Neuro- strength and sensation are intact  EKG-atrial flutter with variable AV block at 82 bpm, qrs int 106 ms, qtc 455 ms Epic records reviewed    Assessment and Plan: 1. Persistent atrial fibrillation with atypical atrial flutter, severe LA enlargement, prior AVR/MVR and MAZE as well as AF ablation at [facility] Has returned to atrial flutter since the end of October despite use of Tikosyn  and has now had 4 weekly therapeutic INR's and can  proceed with cardioversion. Will need INR am of cardioversion  Continue coumadin for CHADS2VASC of 4  Scheduled 12 /11 with Dr. [name] for cardioversion F/u with Dr. [name] 1/10 2019  Butch Penny C. Roxanna Mcever, [facility] [address] [place], [place] [address] [phone]

## 2018-02-07 DIAGNOSIS — M25551 Pain in right hip: Secondary | ICD-10-CM | POA: Diagnosis not present

## 2018-02-07 DIAGNOSIS — M1611 Unilateral primary osteoarthritis, right hip: Secondary | ICD-10-CM | POA: Diagnosis not present

## 2018-02-14 ENCOUNTER — Other Ambulatory Visit (HOSPITAL_COMMUNITY): Payer: Self-pay

## 2018-02-14 ENCOUNTER — Encounter (HOSPITAL_COMMUNITY)
Admission: RE | Disposition: A | Discharge status: Home / Self Care | Payer: Self-pay | Source: Home / Self Care | Attending: Cardiology

## 2018-02-14 ENCOUNTER — Telehealth (HOSPITAL_COMMUNITY): Payer: Self-pay

## 2018-02-14 ENCOUNTER — Ambulatory Visit (INDEPENDENT_AMBULATORY_CARE_PROVIDER_SITE_OTHER): Payer: Medicare Other | Admitting: *Deleted

## 2018-02-14 ENCOUNTER — Encounter (HOSPITAL_COMMUNITY): Payer: Self-pay | Admitting: *Deleted

## 2018-02-14 ENCOUNTER — Ambulatory Visit (HOSPITAL_COMMUNITY): Payer: Medicare Other | Admitting: Certified Registered"

## 2018-02-14 ENCOUNTER — Ambulatory Visit (HOSPITAL_COMMUNITY)
Admission: RE | Admit: 2018-02-14 | Discharge: 2018-02-14 | Disposition: A | Discharge status: Home / Self Care | Payer: Medicare Other | Attending: Cardiology | Admitting: Cardiology

## 2018-02-14 DIAGNOSIS — I4819 Other persistent atrial fibrillation: Secondary | ICD-10-CM | POA: Insufficient documentation

## 2018-02-14 DIAGNOSIS — R0602 Shortness of breath: Secondary | ICD-10-CM | POA: Diagnosis not present

## 2018-02-14 DIAGNOSIS — I482 Chronic atrial fibrillation, unspecified: Secondary | ICD-10-CM | POA: Diagnosis not present

## 2018-02-14 DIAGNOSIS — Z952 Presence of prosthetic heart valve: Secondary | ICD-10-CM | POA: Insufficient documentation

## 2018-02-14 DIAGNOSIS — Z5181 Encounter for therapeutic drug level monitoring: Secondary | ICD-10-CM | POA: Diagnosis not present

## 2018-02-14 DIAGNOSIS — I484 Atypical atrial flutter: Secondary | ICD-10-CM | POA: Insufficient documentation

## 2018-02-14 DIAGNOSIS — Z886 Allergy status to analgesic agent status: Secondary | ICD-10-CM | POA: Diagnosis not present

## 2018-02-14 DIAGNOSIS — I4892 Unspecified atrial flutter: Secondary | ICD-10-CM

## 2018-02-14 DIAGNOSIS — E785 Hyperlipidemia, unspecified: Secondary | ICD-10-CM | POA: Diagnosis not present

## 2018-02-14 DIAGNOSIS — Z8673 Personal history of transient ischemic attack (TIA), and cerebral infarction without residual deficits: Secondary | ICD-10-CM | POA: Insufficient documentation

## 2018-02-14 DIAGNOSIS — Z7901 Long term (current) use of anticoagulants: Secondary | ICD-10-CM | POA: Insufficient documentation

## 2018-02-14 DIAGNOSIS — Z8249 Family history of ischemic heart disease and other diseases of the circulatory system: Secondary | ICD-10-CM | POA: Insufficient documentation

## 2018-02-14 DIAGNOSIS — I10 Essential (primary) hypertension: Secondary | ICD-10-CM | POA: Diagnosis not present

## 2018-02-14 DIAGNOSIS — Z79899 Other long term (current) drug therapy: Secondary | ICD-10-CM | POA: Diagnosis not present

## 2018-02-14 HISTORY — PX: CARDIOVERSION: SHX1299

## 2018-02-14 LAB — POCT INR: INR: 3.9 — AB (ref 2.0–3.0)

## 2018-02-14 SURGERY — CARDIOVERSION
Anesthesia: General

## 2018-02-14 MED ORDER — PROPOFOL 10 MG/ML IV BOLUS
INTRAVENOUS | Status: DC | PRN
  Administered 2018-02-14: 60 mg via INTRAVENOUS
  Administered 2018-02-14: 30 mg via INTRAVENOUS

## 2018-02-14 MED ORDER — SODIUM CHLORIDE 0.9 % IV SOLN
INTRAVENOUS | Status: DC | PRN
  Administered 2018-02-14: 13:00:00 via INTRAVENOUS

## 2018-02-14 MED ORDER — LIDOCAINE 2% (20 MG/ML) 5 ML SYRINGE
INTRAMUSCULAR | Status: DC | PRN
  Administered 2018-02-14: 60 mg via INTRAVENOUS

## 2018-02-14 NOTE — Anesthesia Postprocedure Evaluation (Signed)
Anesthesia Post Note  Patient: [name] [name]  Procedure(s) Performed: CARDIOVERSION (N/A )     Patient location during evaluation: PACU Anesthesia Type: General Level of consciousness: awake and alert Pain management: pain level controlled Vital Signs Assessment: post-procedure vital signs reviewed and stable Respiratory status: spontaneous breathing, nonlabored ventilation and respiratory function stable Cardiovascular status: blood pressure returned to baseline and stable Postop Assessment: no apparent nausea or vomiting Anesthetic complications: no    Last Vitals:  Vitals:   02/14/18 1351 02/14/18 1400  BP: 129/82 113/81  Pulse: 61 63  Resp: 16 17  Temp: 36.9 C   SpO2: 100% 98%    Last Pain:  Vitals:   02/14/18 1400  TempSrc:   PainSc: 0-No pain                 Brittian Renaldo,W. EDMOND

## 2018-02-14 NOTE — Procedures (Signed)
Electrical Cardioversion Procedure Note KARL ERWAY [id number] 1940/05/25  Procedure: Electrical Cardioversion Indications:  Atrial Flutter  Procedure Details Consent: Risks of procedure as well as the alternatives and risks of each were explained to the (patient/caregiver).  Consent for procedure obtained. Time Out: Verified patient identification, verified procedure, site/side was marked, verified correct patient position, special equipment/implants available, medications/allergies/relevent history reviewed, required imaging and test results available.  Performed  Patient placed on cardiac monitor, pulse oximetry, supplemental oxygen as necessary.  Sedation given: Propofol per anesthesiology Pacer pads placed anterior and posterior chest.  Cardioverted 4 time(s).  Cardioverted at 200J, the last 2 times with sternal pressure.  He initially did not convert but went back into NSR as pads were being pulled off.   Evaluation Findings: Post procedure EKG shows: NSR Complications: None Patient did tolerate procedure well.   Loralie Champagne 02/14/2018, 1:45 PM

## 2018-02-14 NOTE — Discharge Instructions (Signed)
Electrical Cardioversion, Care After °This sheet gives you information about how to care for yourself after your procedure. Your health care provider may also give you more specific instructions. If you have problems or questions, contact your health care provider. °What can I expect after the procedure? °After the procedure, it is common to have: °· Some redness on the skin where the shocks were given. ° °Follow these instructions at home: °· Do not drive for 24 hours if you were given a medicine to help you relax (sedative). °· Take over-the-counter and prescription medicines only as told by your health care provider. °· Ask your health care provider how to check your pulse. Check it often. °· Rest for 48 hours after the procedure or as told by your health care provider. °· Avoid or limit your caffeine use as told by your health care provider. °Contact a health care provider if: °· You feel like your heart is beating too quickly or your pulse is not regular. °· You have a serious muscle cramp that does not go away. °Get help right away if: °· You have discomfort in your chest. °· You are dizzy or you feel faint. °· You have trouble breathing or you are short of breath. °· Your speech is slurred. °· You have trouble moving an arm or leg on one side of your body. °· Your fingers or toes turn cold or blue. °This information is not intended to replace advice given to you by your health care provider. Make sure you discuss any questions you have with your health care provider. °Document Released: 12/12/2012 Document Revised: 09/25/2015 Document Reviewed: 08/28/2015 °Elsevier Interactive Patient Education © 2018 Elsevier Inc. ° °

## 2018-02-14 NOTE — Patient Instructions (Signed)
Description   Tonight only take 1/2 tablet then continue taking 1.5 tablets daily except 1 tablet on Tuesdays, Thursdays and Sundays. Recheck INR in 1 week. Call and let us know if scheduled for any procedures [phone] or medication changes.

## 2018-02-14 NOTE — Telephone Encounter (Signed)
Echo order placed.

## 2018-02-14 NOTE — Transfer of Care (Signed)
Immediate Anesthesia Transfer of Care Note  Patient: [name] [name]  Procedure(s) Performed: CARDIOVERSION (N/A )  Patient Location: Endoscopy Unit  Anesthesia Type:General  Level of Consciousness: drowsy and patient cooperative  Airway & Oxygen Therapy: Patient Spontanous Breathing and Patient connected to nasal cannula oxygen  Post-op Assessment: Report given to RN, Post -op Vital signs reviewed and stable and Patient moving all extremities  Post vital signs: Reviewed and stable  Last Vitals:  Vitals Value Taken Time  BP    Temp    Pulse    Resp    SpO2      Last Pain:  Vitals:   02/14/18 1248  TempSrc: Oral  PainSc: 0-No pain         Complications: No apparent anesthesia complications

## 2018-02-14 NOTE — Interval H&P Note (Signed)
History and Physical Interval Note:  02/14/2018 1:45 PM  [name] [name]  has presented today for surgery, with the diagnosis of AFIB  The various methods of treatment have been discussed with the patient and family. After consideration of risks, benefits and other options for treatment, the patient has consented to  Procedure(s): CARDIOVERSION (N/A) as a surgical intervention .  The patient's history has been reviewed, patient examined, no change in status, stable for surgery.  I have reviewed the patient's chart and labs.  Questions were answered to the patient's satisfaction.     [name] [name]

## 2018-02-14 NOTE — Anesthesia Preprocedure Evaluation (Addendum)
Anesthesia Evaluation  Patient identified by MRN, date of birth, ID band Patient awake    Reviewed: Allergy & Precautions, H&P , NPO status , Patient's Chart, lab work & pertinent test results  Airway Mallampati: II  TM Distance: >3 FB Neck ROM: Full    Dental no notable dental hx. (+) Teeth Intact, Dental Advisory Given   Pulmonary neg pulmonary ROS,    Pulmonary exam normal breath sounds clear to auscultation       Cardiovascular hypertension, Pt. on medications and Pt. on home beta blockers + dysrhythmias Atrial Fibrillation  Rhythm:Irregular Rate:Normal     Neuro/Psych CVA, No Residual Symptoms negative psych ROS   GI/Hepatic negative GI ROS, Neg liver ROS,   Endo/Other  negative endocrine ROS  Renal/GU negative Renal ROS  negative genitourinary   Musculoskeletal   Abdominal   Peds  Hematology negative hematology ROS (+)   Anesthesia Other Findings   Reproductive/Obstetrics negative OB ROS                            Anesthesia Physical Anesthesia Plan  ASA: III  Anesthesia Plan: General   Post-op Pain Management:    Induction: Intravenous  PONV Risk Score and Plan: 3 and Treatment may vary due to age or medical condition  Airway Management Planned: Mask  Additional Equipment:   Intra-op Plan:   Post-operative Plan:   Informed Consent: I have reviewed the patients History and Physical, chart, labs and discussed the procedure including the risks, benefits and alternatives for the proposed anesthesia with the patient or authorized representative who has indicated his/her understanding and acceptance.   Dental advisory given  Plan Discussed with: CRNA  Anesthesia Plan Comments:         Anesthesia Quick Evaluation

## 2018-02-14 NOTE — Progress Notes (Signed)
Opened in error

## 2018-02-16 ENCOUNTER — Encounter (HOSPITAL_COMMUNITY): Payer: Self-pay | Admitting: Cardiology

## 2018-02-16 ENCOUNTER — Telehealth (HOSPITAL_COMMUNITY): Payer: Self-pay

## 2018-02-16 NOTE — Telephone Encounter (Signed)
Surgical clearance faxed to [facility] .  1.Pt needs lovenox bridge off warfarin, 2.Needs echo with f/u before surgery echo scheduled 12/19 follow up scheduled 03/16/18

## 2018-02-22 ENCOUNTER — Ambulatory Visit (HOSPITAL_COMMUNITY)
Admission: RE | Admit: 2018-02-22 | Discharge: 2018-02-22 | Disposition: A | Discharge status: Home / Self Care | Payer: Medicare Other | Source: Ambulatory Visit | Attending: Cardiology | Admitting: Cardiology

## 2018-02-22 ENCOUNTER — Ambulatory Visit (INDEPENDENT_AMBULATORY_CARE_PROVIDER_SITE_OTHER): Payer: Medicare Other | Admitting: Pharmacist

## 2018-02-22 ENCOUNTER — Telehealth (HOSPITAL_COMMUNITY): Payer: Self-pay

## 2018-02-22 ENCOUNTER — Telehealth (HOSPITAL_COMMUNITY): Payer: Self-pay | Admitting: *Deleted

## 2018-02-22 DIAGNOSIS — Z5181 Encounter for therapeutic drug level monitoring: Secondary | ICD-10-CM

## 2018-02-22 DIAGNOSIS — I482 Chronic atrial fibrillation, unspecified: Secondary | ICD-10-CM

## 2018-02-22 DIAGNOSIS — R0600 Dyspnea, unspecified: Secondary | ICD-10-CM | POA: Insufficient documentation

## 2018-02-22 DIAGNOSIS — I4892 Unspecified atrial flutter: Secondary | ICD-10-CM

## 2018-02-22 DIAGNOSIS — I099 Rheumatic heart disease, unspecified: Secondary | ICD-10-CM | POA: Insufficient documentation

## 2018-02-22 DIAGNOSIS — Z7901 Long term (current) use of anticoagulants: Secondary | ICD-10-CM | POA: Diagnosis not present

## 2018-02-22 DIAGNOSIS — Z8673 Personal history of transient ischemic attack (TIA), and cerebral infarction without residual deficits: Secondary | ICD-10-CM | POA: Insufficient documentation

## 2018-02-22 DIAGNOSIS — Z952 Presence of prosthetic heart valve: Secondary | ICD-10-CM | POA: Insufficient documentation

## 2018-02-22 LAB — POCT INR: INR: 2.2 (ref 2.0–3.0)

## 2018-02-22 NOTE — Telephone Encounter (Signed)
surgical clearance for total right hip clearance faxed to [facility] at [phone] pt stop coumadin 7 day prior to surgery, pt needs echo prior to surgery and needs Lovenox bridge through coumadin clinic

## 2018-02-22 NOTE — Patient Instructions (Signed)
Description   Continue taking 1.5 tablets daily except 1 tablet on Tuesdays, Thursdays and Sundays. Recheck INR in 4 weeks. Call and let us know if scheduled for any procedures [phone] or medication changes.

## 2018-02-22 NOTE — Telephone Encounter (Signed)
Patient called in stating after 4 days he was back in aflutter. Hr is controlled in the 70-90s. He is pending hip surgery on 04/03/18. Sees dr. [name] in early January. Per donna carroll np - have patient touch base with [facility] EP Dr. Boyd Kerbs as far as recommendations with newly failed cardioversion with tikosyn on board. Will also check with Dr. Rayann Heman regarding continued tikosyn with ERAF.

## 2018-02-22 NOTE — Progress Notes (Signed)
  Echocardiogram 2D Echocardiogram has been performed.  [name] [name] G Jaria Conway 02/22/2018, 10:07 AM

## 2018-02-26 ENCOUNTER — Telehealth (HOSPITAL_COMMUNITY): Payer: Self-pay

## 2018-02-26 NOTE — Telephone Encounter (Signed)
Pt called no answer voice mail left for pt to call back  Normal EF, bioprosthetic valves look ok. 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 He should be stable for his orthopedic surgery.

## 2018-03-02 ENCOUNTER — Telehealth (HOSPITAL_COMMUNITY): Payer: Self-pay | Admitting: *Deleted

## 2018-03-02 NOTE — Telephone Encounter (Signed)
Echo results with note clearing patient for orthopedic surgery faxed. Pt will need to stop coumadin 7 days prior to surgery and will need lovenox bridge through coumadin clinic.

## 2018-03-09 NOTE — H&P (Signed)
TOTAL HIP ADMISSION H&P  Patient is admitted for right total hip arthroplasty, anterior approach.  Subjective:  Chief Complaint:  Right hip primary OA / pain  HPI: [name] [name], 78 y.o. male, has a history of pain and functional disability in the right hip(s) due to arthritis and patient has failed non-surgical conservative treatments for greater than 12 weeks to include NSAID's and/or analgesics, use of assistive devices and activity modification.  Onset of symptoms was gradual starting 1-1.5 years ago with gradually worsening course since that time.The patient noted no past surgery on the right hip(s).  Patient currently rates pain in the right hip at 8 out of 10 with activity. Patient has worsening of pain with activity and weight bearing, trendelenberg gait, pain that interfers with activities of daily living and pain with passive range of motion. Patient has evidence of periarticular osteophytes and joint space narrowing by imaging studies. This condition presents safety issues increasing the risk of falls.  There is no current active infection.  Risks, benefits and expectations were discussed with the patient.  Risks including but not limited to the risk of anesthesia, blood clots, nerve damage, blood vessel damage, failure of the prosthesis, infection and up to and including death.  Patient understand the risks, benefits and expectations and wishes to proceed with surgery.   PCP: [name], MD  D/C Plans:       Home   Post-op Meds:       No Rx given   Tranexamic Acid:      To be given - IV   Decadron:      Is to be given  FYI:      Coumadin with Lovenox  Norco  DME:   Pt already has equipment   PT:   No PT     Patient Active Problem List   Diagnosis Date Noted  . Long term (current) use of anticoagulants 01/05/2018  . History of food allergy 12/11/2017  . Allergic reaction 12/11/2017  . Emesis 12/11/2017  . Visit for monitoring Tikosyn therapy 06/06/2017  . Internal  hemorrhoid, bleeding 05/19/2016  . 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  . 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  . Aortic valve disorder 07/09/2015  . Long term current use of anticoagulant therapy 01/19/2015  . Chronic a-fib 01/20/2014  . Elevated PSA 12/23/2013  . Prediabetes 12/19/2013  . Benign prostatic hyperplasia with urinary obstruction 10/29/2013  . 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 Atrial flutter ([place]) 05/17/2013  . Essential hypertension 06/19/2012  . Routine health maintenance 04/26/2011  . Hyperlipidemia with target LDL less than 130 07/07/2008  . History of colonic polyps 06/16/2008  . ERECTILE DYSFUNCTION 09/25/2007   Past Medical History:  Diagnosis Date  . Acute rheumatic heart disease, unspecified    childhood, age  89 & 33  . Acute rheumatic pericarditis   . Atrial fibrillation ([place])    history  . Diverticulosis   . 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 External hemorrhoids without mention of complication   . 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 Lesion of ulnar nerve   . 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 Other and unspecified hyperlipidemia   . Previous back surgery 1978, jan 2007  . Psychosexual dysfunction with inhibited sexual excitement   . SOB (shortness of breath)    "with heavy exercise"  . Stroke [facility])     Past Surgical  History:  Procedure Laterality Date  . AORTIC AND MITRAL VALVE REPLACEMENT     09/2004  . CARDIOVERSION     3 times from 2004-2006  . CARDIOVERSION N/A 09/26/2013   Procedure: CARDIOVERSION;  Surgeon: Larey Dresser, MD;  Location: [place];  Service: Cardiovascular;  Laterality: N/A;  . CARDIOVERSION N/A 06/19/2014   Procedure: CARDIOVERSION;  Surgeon: Jerline Pain, MD;  Location: [place];  Service: Cardiovascular;  Laterality: N/A;  . CARDIOVERSION N/A 04/24/2017   Procedure: CARDIOVERSION;  Surgeon: Larey Dresser, MD;  Location: [facility] ENDOSCOPY;   Service: Cardiovascular;  Laterality: N/A;  . CARDIOVERSION N/A 06/08/2017   Procedure: CARDIOVERSION;  Surgeon: Pixie Casino, MD;  Location: [facility] ENDOSCOPY;  Service: Cardiovascular;  Laterality: N/A;  . CARDIOVERSION N/A 08/24/2017   Procedure: CARDIOVERSION;  Surgeon: Skeet Latch, MD;  Location: [facility] ENDOSCOPY;  Service: Cardiovascular;  Laterality: N/A;  . CARDIOVERSION N/A 02/14/2018   Procedure: CARDIOVERSION;  Surgeon: Larey Dresser, MD;  Location: [facility] ENDOSCOPY;  Service: Cardiovascular;  Laterality: N/A;  . COLONOSCOPY    . laminectomies     10/1975 and in 03/2005  . TONSILLECTOMY     1950    No current facility-administered medications for this encounter.    Current Outpatient Medications  Medication Sig Dispense Refill Last Dose  . acetaminophen (TYLENOL) 325 MG tablet Take 325 mg by mouth every 6 (six) hours as needed for moderate pain or headache.   Past Week at Unknown time  . atorvastatin (LIPITOR) 80 MG tablet Take 1 tablet by mouth  daily (Patient taking differently: Take 80 mg by mouth every evening. ) 90 tablet 3 02/13/2018 at Unknown time  . clobetasol cream (TEMOVATE) 2.63 % Apply 1 application topically daily as needed (skin irritation).    Past Week at Unknown time  . Coenzyme Q10 (CO Q-10) 300 MG CAPS Take 300 mg by mouth daily.   02/13/2018 at Unknown time  . Cyanocobalamin (VITAMIN B-12) 5000 MCG SUBL Place 5,000 mcg under the tongue daily at 2 PM.    02/13/2018 at Unknown time  . docusate sodium (COLACE) 100 MG capsule Take 100 mg by mouth daily.   02/13/2018 at Unknown time  . dofetilide (TIKOSYN) 500 MCG capsule Take 1 capsule (500 mcg total) by mouth 2 (two) times daily. 14 capsule 1 02/14/2018 at Unknown time  . fish oil-omega-3 fatty acids 1000 MG capsule Take 1 g by mouth 2 (two) times daily.    02/13/2018 at Unknown time  . furosemide (LASIX) 20 MG tablet Take 1 tablet (20 mg total) by mouth daily. 30 tablet 3 02/14/2018 at Unknown time  .  Glucosamine-Chondroitin (COSAMIN DS PO) Take 1 tablet by mouth 2 (two) times daily.   02/13/2018 at Unknown time  . hydrocortisone (ANUSOL-HC) 2.5 % rectal cream Place 1 application rectally 2 (two) times daily. (Patient taking differently: Place 1 application rectally 2 (two) times daily as needed for hemorrhoids. ) 30 g 0 Past Week at Unknown time  . loratadine (CLARITIN) 10 MG tablet Take 10 mg by mouth daily as needed for allergies.    Past Week at Unknown time  . losartan (COZAAR) 25 MG tablet TAKE 1 TABLET DAILY (Patient taking differently: Take 25 mg by mouth every evening. ) 90 tablet 3 02/13/2018 at Unknown time  . metoprolol succinate (TOPROL-XL) 50 MG 24 hr tablet Take 75 mg by mouth 2 (two) times daily. Take with or immediately following a meal.    02/14/2018 at Unknown time  .  Multiple Vitamins-Minerals (MULTIVITAL-M) TABS Take 1 tablet by mouth daily.    02/13/2018 at Unknown time  . potassium chloride SA (K-DUR,KLOR-CON) 20 MEQ tablet Take 1 tablet (20 mEq total) by mouth daily. 30 tablet 3 02/13/2018 at Unknown time  . Probiotic Product (PROBIOTIC FORMULA PO) Take 1 capsule by mouth 2 (two) times daily.    02/13/2018 at Unknown time  . warfarin (COUMADIN) 5 MG tablet Take as directed by Coumadin Clinic (Patient taking differently: Take 5-7.5 mg by mouth See admin instructions. Take 7.5 mg by mouth daily in the evening on Mon, Wed, Fri, and Sat and take 5 mg daily in the evening on Sun, Tues, and Thur) 120 tablet 1 02/13/2018 at Unknown time   Allergies  Allergen Reactions  . Naproxen Hives    Social History   Tobacco Use  . Smoking status: Never Smoker  . Smokeless tobacco: Never Used  Substance Use Topics  . Alcohol use: Yes    Alcohol/week: 3.0 standard drinks    Types: 1 Glasses of wine, 1 Cans of beer, 1 Shots of liquor per week    Comment: wine occasionally    Family History  Problem Relation Age of Onset  . Macular degeneration Mother        macular degeneration  .  Cancer Father        intestinal/GI  . Diabetes Paternal Aunt   . Heart attack Maternal Grandfather   . Diabetes Brother      Review of Systems  Constitutional: Negative.   HENT: Negative.   Eyes: Negative.   Respiratory: Negative.   Cardiovascular: Negative.   Gastrointestinal: Negative.   Genitourinary: Positive for frequency.  Musculoskeletal: Positive for joint pain.  Skin: Negative.   Neurological: Negative.   Endo/Heme/Allergies: Negative.   Psychiatric/Behavioral: Negative.     Objective:  Physical Exam  Constitutional: He is oriented to person, place, and time. He appears well-developed.  HENT:  Head: Normocephalic.  Eyes: Pupils are equal, round, and reactive to light.  Neck: Neck supple. No JVD present. No tracheal deviation present. No thyromegaly present.  Cardiovascular: Normal rate, regular rhythm and intact distal pulses.  Murmur (2 prosthetic valves) heard. Respiratory: Effort normal and breath sounds normal. No respiratory distress. He has no wheezes.  GI: Soft. There is no abdominal tenderness. There is no guarding.  Musculoskeletal:     Right hip: He exhibits decreased range of motion, decreased strength, tenderness and bony tenderness. He exhibits no swelling, no deformity and no laceration.  Lymphadenopathy:    He has no cervical adenopathy.  Neurological: He is alert and oriented to person, place, and time.  Skin: Skin is warm and dry.  Psychiatric: He has a normal mood and affect.      Labs:  Estimated body mass index is 30.87 kg/m as calculated from the following:   Height as of 02/06/18: 5\' 8"  (1.727 m).   Weight as of 02/06/18: 92.1 kg.   Imaging Review Plain radiographs demonstrate severe degenerative joint disease of the right hip. The bone quality appears to be good for age and reported activity level.    Preoperative templating of the joint replacement has been completed, documented, and submitted to the Operating Room personnel in  order to optimize intra-operative equipment management.     Assessment/Plan:  End stage arthritis, right hip  The patient history, physical examination, clinical judgement of the provider and imaging studies are consistent with end stage degenerative joint disease of the right hip and total hip  arthroplasty is deemed medically necessary. The treatment options including medical management, injection therapy, arthroscopy and arthroplasty were discussed at length. The risks and benefits of total hip arthroplasty were presented and reviewed. The risks due to aseptic loosening, infection, stiffness, dislocation/subluxation,  thromboembolic complications and other imponderables were discussed.  The patient acknowledged the explanation, agreed to proceed with the plan and consent was signed. Patient is being admitted for inpatient treatment for surgery, pain control, PT, OT, prophylactic antibiotics, VTE prophylaxis, progressive ambulation and ADL's and discharge planning.The patient is planning to be discharged home.   [name] Pugh Corby Vandenberghe   PA-C  03/09/2018, 1:24 PM

## 2018-03-12 ENCOUNTER — Encounter (HOSPITAL_COMMUNITY): Payer: Self-pay

## 2018-03-12 NOTE — Telephone Encounter (Signed)
Discussed with Dr. Rayann Heman - wants patient to come in for appt. Scheduling made aware.

## 2018-03-16 ENCOUNTER — Ambulatory Visit (HOSPITAL_COMMUNITY)
Admission: RE | Admit: 2018-03-16 | Discharge: 2018-03-16 | Disposition: A | Discharge status: Home / Self Care | Payer: Medicare Other | Source: Ambulatory Visit | Attending: Cardiology | Admitting: Cardiology

## 2018-03-16 ENCOUNTER — Encounter (HOSPITAL_COMMUNITY): Payer: Self-pay | Admitting: Cardiology

## 2018-03-16 VITALS — BP 140/80 | HR 99 | Wt 205.6 lb

## 2018-03-16 DIAGNOSIS — I4891 Unspecified atrial fibrillation: Secondary | ICD-10-CM | POA: Insufficient documentation

## 2018-03-16 DIAGNOSIS — I059 Rheumatic mitral valve disease, unspecified: Secondary | ICD-10-CM | POA: Diagnosis not present

## 2018-03-16 DIAGNOSIS — Z952 Presence of prosthetic heart valve: Secondary | ICD-10-CM | POA: Insufficient documentation

## 2018-03-16 DIAGNOSIS — Z8673 Personal history of transient ischemic attack (TIA), and cerebral infarction without residual deficits: Secondary | ICD-10-CM | POA: Insufficient documentation

## 2018-03-16 DIAGNOSIS — Z7901 Long term (current) use of anticoagulants: Secondary | ICD-10-CM | POA: Diagnosis not present

## 2018-03-16 DIAGNOSIS — I484 Atypical atrial flutter: Secondary | ICD-10-CM | POA: Insufficient documentation

## 2018-03-16 DIAGNOSIS — I110 Hypertensive heart disease with heart failure: Secondary | ICD-10-CM | POA: Insufficient documentation

## 2018-03-16 DIAGNOSIS — I4892 Unspecified atrial flutter: Secondary | ICD-10-CM | POA: Diagnosis not present

## 2018-03-16 DIAGNOSIS — I5032 Chronic diastolic (congestive) heart failure: Secondary | ICD-10-CM

## 2018-03-16 DIAGNOSIS — I712 Thoracic aortic aneurysm, without rupture: Secondary | ICD-10-CM | POA: Insufficient documentation

## 2018-03-16 DIAGNOSIS — Z79899 Other long term (current) drug therapy: Secondary | ICD-10-CM | POA: Insufficient documentation

## 2018-03-16 DIAGNOSIS — E785 Hyperlipidemia, unspecified: Secondary | ICD-10-CM | POA: Diagnosis not present

## 2018-03-16 DIAGNOSIS — I10 Essential (primary) hypertension: Secondary | ICD-10-CM | POA: Diagnosis not present

## 2018-03-16 LAB — CBC
HCT: 43.9 % (ref 39.0–52.0)
Hemoglobin: 14.2 g/dL (ref 13.0–17.0)
MCH: 30.5 pg (ref 26.0–34.0)
MCHC: 32.3 g/dL (ref 30.0–36.0)
MCV: 94.2 fL (ref 80.0–100.0)
Platelets: 247 10*3/uL (ref 150–400)
RBC: 4.66 MIL/uL (ref 4.22–5.81)
RDW: 13.2 % (ref 11.5–15.5)
WBC: 8.7 10*3/uL (ref 4.0–10.5)
nRBC: 0 % (ref 0.0–0.2)

## 2018-03-16 LAB — BASIC METABOLIC PANEL
Anion gap: 8 (ref 5–15)
BUN: 16 mg/dL (ref 8–23)
CO2: 25 mmol/L (ref 22–32)
Calcium: 9.1 mg/dL (ref 8.9–10.3)
Chloride: 105 mmol/L (ref 98–111)
Creatinine, Ser: 1.06 mg/dL (ref 0.61–1.24)
GFR calc Af Amer: >60 mL/min (ref >60)
Glucose, Bld: 123 mg/dL — ABNORMAL HIGH (ref 70–99)
POTASSIUM: 4.2 mmol/L (ref 3.5–5.1)
Sodium: 138 mmol/L (ref 135–145)

## 2018-03-16 MED ORDER — FUROSEMIDE 20 MG PO TABS: 20.0000 mg | ORAL_TABLET | Freq: Every day | ORAL | 1 refills | Status: DC

## 2018-03-16 MED ORDER — DOFETILIDE 500 MCG PO CAPS: 500.0000 ug | ORAL_CAPSULE | Freq: Two times a day (BID) | ORAL | 1 refills | Status: DC

## 2018-03-16 MED ORDER — ATORVASTATIN CALCIUM 80 MG PO TABS: 80.0000 mg | ORAL_TABLET | Freq: Every evening | ORAL | 1 refills | Status: DC

## 2018-03-16 MED ORDER — POTASSIUM CHLORIDE CRYS ER 20 MEQ PO TBCR: 20.0000 meq | EXTENDED_RELEASE_TABLET | Freq: Every day | ORAL | 1 refills | Status: DC

## 2018-03-16 MED ORDER — METOPROLOL SUCCINATE ER 50 MG PO TB24: 75.0000 mg | ORAL_TABLET | Freq: Two times a day (BID) | ORAL | 1 refills | Status: DC

## 2018-03-16 MED ORDER — LOSARTAN POTASSIUM 25 MG PO TABS: 25.0000 mg | ORAL_TABLET | Freq: Every day | ORAL | 3 refills | Status: DC

## 2018-03-16 NOTE — Patient Instructions (Signed)
Labs done today  Follow up with the coumadin clinic in order to start Lovenox before your surgery.  Follow up with Dr. [name] in 2 months

## 2018-03-18 NOTE — Progress Notes (Signed)
Patient ID: [name] [name], male   DOB: [birth year], 78 y.o.   MRN: [id number] PCP: Dr. Ronnald Ramp Cardiology: Dr. [name]  78 y.o. with history of rheumatic fever and AI/[name] s/p bioprosthetic AVR and MVR at the [facility] as well as MAZE procedure all in 2006 presents for followup of valvular heart disease and atrial flutter/fibrillation.  He also has an ascending aortic aneurysm.   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  I started him on dronedarone and coumadin with plan for cardioversion after 4 wks of therapeutic anticoagulation if he did not convert back to NSR on his own.  He went back into NSR spontaneously and later stopped coumadin. Atrial flutter recurred in 3/15 and again resolved spontaneously.  In 6/15, patient was admitted with transient dysarthria (resolved in about 10 minutes).  However, he was found to have evidence for CVA by MRI.  Therefore, he was started on coumadin. He was noted to be in atypical atrial flutter again.  Ranolazine was added to dronedarone.    In 8/15, [name] [name] was seen in consultation at the [facility] clinic.  He had atrial flutter ablation at [facility] in 11/15.    He was seen in followup at [facility] in 2/16.  Ranolazine was stopped.  Echo in 2/16 showed EF 55% with stable bioprosthetic mitral and aortic valves.  CTA chest showed stable 5 cm ascending aortic aneurysm.  He went back into atypical atrial flutter in 4/16.  Ranolazine was restarted and he was cardioverted back to NSR.    Last visit to [facility] was in 5/18.  Echo at that time showed EF 60-65% with stable bioprosthetic mitral and aortic valves.  CTA showed 5.0 cm aortic root and ascending aorta.  Cardiolite in 12/18 showed no ischemia. He wore a 30 day monitor in 12/18 with no arrhythmia.    He went into atrial flutter again during a respiratory illness in 2/19.  On 04/24/17, he was cardioverted back to NSR.  He thinks that he went  into atrial flutter again later on that day. He was back in atrial flutter at followup appointment. He was then admitted for Tikosyn loading in 4/19 and was cardioverted back to NSR after the load.   He had atrial fibrillation again in 6/19 with DCCV.   He was seen at the [facility] in 9/19.  Echo showed normal LV and RV systolic function, bioprosthetic MV with mean gradient up to 9 mmHg, normal bioprosthetic AoV.  CTA chest showed aortic root up to 5.3 cm.   He was back in atrial flutter in 12/19, had DCCV back to NSR.  Echo in 12/19 showed EF 60-65%, normal bioprosthetic aortic valve, bioprosthetic mitral valve with mean gradient 4 mmHg and no [name], severe LAE, aortic root 4.8 cm.   He returns for followup of atrial flutter and valvular disease. He is back in atrial flutter today, thinks he only stayed in NSR for about 3 days after DCCV.  The last DCCV was more difficult, he was shocked 4 times.  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  No significant exertional dyspnea.  No orthopnea/PND.  No chest pain.   ECG (12/12/11): Atypical atrial flutter versus atrial tachycardia with rate around 100 bpm, QTc normal.  ECG (12/29/11): NSR, 1st degree AV block PR 242 msec.    ECG (12/13): NSR, 1st degree AV block PR 244 msec    ECG (10/14):  NSR, 1st degree AV block ECG (3/15): Atypical atrial flutter with rate 87, inferior T wave inversions ECG (6/15): Atypical atrial flutter with rate 77 ECG (8/15): atypical atrial flutter rate 82 ECG (10/15): NSR, 1st degree AV block ECG (12/15): NSR, 1st degree AV block, QTc 410 msec ECG (5/16): NSR, 1st degree AV block, QTc 419 msec ECG (5/17): NSR, 1st degree AV block 270 msec, QTc 433 ECG (2/18): NSR, 1st degree AVB, nonspecific ST-T changes, QTc 435 msec ECG (11/18): NSR, 1st degree AVB, QTc 424 msec ECG (2/19, personally reviewed): atypical atrial flutter at 87, QTc 442 msec ECG (4/19, personally reviewed): NSR, 1st degree AVB, QTc 453  msec ECG (10/19, personally reviewed): NSR, 1st degree AVB, QTc 448 msec ECG (1/20, personally reviewed): atypical atrial flutter in 90s, QTc 472 msec.   Labs (3/14): LDL 64, TSH normal, K 3.9, creatinine 1.0     Labs (6/15): LDL 54, HDL 48, creatinine 0.95   Labs (9/15): LDL 54, HDL 25 Labs (10/15): K 4.5, creatinine 1.3, AST 45, ALT 64 Labs (4/16): K 4.3, creatinine 1.28, HCT 40.7 Labs (5/17): K 4.1, creatinine 1.16, LDL 66, HDL 47 Labs (2/18): LDL 42, HDL 43, K 4.4, creatinine 1.48 Labs (3/18): hgb 13.8 Labs (2/19): LDL 59 Labs (4/19): K 3.9, creatinine 0.97, Mg 2.0 Labs (6/19): K 4.4, creatinine 0.9 Labs (10/19): LDL 84 Labs (12/19): K 4.2, creatinine 1.13, hgb 13.9                                                                                                                                 PMH: 1. Atrial fibrillation: s/p MAZE and LAA ligation in 2006.  No documented atrial fibrillation since operation.  2. Atypical atrial flutter: 10/13, 3/15, and again in 6/15.  Recurrent 4/16 with DCCV.  - Event monitor (12/18): no significant arrhythmia.  - Recurrence in 2/19: DCCV to NSR but quickly recurred.  - Admitted in 4/19 for Tikosyn loading and cardioverted to NSR.  - DCCV to NSR in 6/19.  - DCCV to NSR in 12/19.  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   - Echo 5/11 with EF 60-65%, normal bioprosthetic aortic valve, normal bioprosthetic mitral valve, moderate LAE.   - Echo 11/12 at [facility] looked ok per report.  - Echo (10/13) with EF 55-60%, mild LVH, normal bioprosthetic aortic and mitral valves, moderate LAE, PA systolic pressure 35 mmHg.   - Echo (6/15) with EF 60%, moderate LVH, normal-appearing bioprosthetic MV and AoV, PA systolic pressure 32 mmHg.   - Echo 9/15 [facility]) with EF 55%, mild LVH, PASP 52 mmHg, severe LAE, bioprosthetic mitral valve with no [name] and mean gradient 11 mmHg,  moderate TR, bioprosthetic aortic valve with no AI and mean gradient 9 mmHg, dilated ascending aorta to 5 cm.   - Echo (2/16, [facility]) with EF 55%, stable bioprosthetic aortic  and mitral valves (AoV mean gradient 7 mmHg, MV mean gradient 4 mmHg), normal RV size and systolic function.  - Echo (12/16, [facility]) with EF 58%, mild RV dilation with low normal systolic function, PA systolic pressure 34 mmHg, bioprosthetic MV and AoV functioning normally.  - Echo (5/18, [facility]) with EF 60-65%, RV normal size and systolic function, moderate LAE, bioprosthetic MV mean gradient 6 with no [name], bioprosthetic aortic valve with mean gradient 11 and no AI, PASP 41 mmHg.  - Echo (2/19): EF 60-65%, moderate LVH, bioprosthetic aortic and mitral valves functioning normally, PASP 23 mmHg, aortic root 4.5 cm.  - Echo (9/19): EF 56%, normal RV size and systolic function, bioprosthetic MV with mean gradient 9 mmHg, trivial [name], bioprosthetic aortic valve with mean gradient 12 mmHg, PASP 51 mmHg.  - Echo (12/19): EF 60-65%, normal bioprosthetic aortic valve, bioprosthetic mitral valve with mean gradient 4 mmHg and no [name], severe LAE, aortic root 4.8 cm.  4. Hyperlipidemia 5. L-spine surgery 6. LHC with no significant coronary disease in 2006 pre-op.  - Cardiolite (12/18): No ischemia.  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  CTA chest 9/15 with dilated sinuses of valsalva to 5.0 cm and ascending aorta 4.9 cm. CTA chest (2/16) with 5.0 cm ascending aorta.  CTA chest (12/16) with 4.9 cm aortic root at SOV, 4.8 cm ascending aorta.  - CT chest (5/18, [facility]): 5.0 cm aortic root, 5.0 cm ascending aorta.  - CTA chest (10/19, [facility]): 5.3 cm aortic root, 5.2 cm ascending aorta.  9. Chronic diastolic CHF  SH: Married, retired Forensic psychologist [name], [place], [place]).  2 sons.  Never smoked.    FH: No premature CAD.   ROS: All systems reviewed and negative except as per HPI.  Current Outpatient Medications  Medication Sig Dispense Refill  . acetaminophen (TYLENOL) 325 MG tablet Take 325 mg by mouth every 6 (six) hours as needed for moderate pain or headache.    [name] atorvastatin (LIPITOR) 80 MG tablet Take 1 tablet (80 mg total) by mouth every evening. 90 tablet 1  . atorvastatin (LIPITOR) 80 MG tablet Take 80 mg by mouth daily.    . clobetasol cream (TEMOVATE) 3.35 % Apply 1 application topically daily as needed (skin irritation).     . Coenzyme Q10 (CO Q-10) 300 MG CAPS Take 300 mg by mouth daily.    . Cyanocobalamin (VITAMIN B-12) 5000 MCG SUBL Place 5,000 mcg under the tongue daily at 2 PM.     . docusate sodium (COLACE) 100 MG capsule Take 100 mg by mouth daily.    [name] dofetilide (TIKOSYN) 500 MCG capsule Take 1 capsule (500 mcg total) by mouth 2 (two) times daily. 180 capsule 1  . dofetilide (TIKOSYN) 500 MCG capsule Take 500 mcg by mouth 2 (two) times daily.    . fish oil-omega-3 fatty acids 1000 MG capsule Take 1 g by mouth 2 (two) times daily.     . furosemide (LASIX) 20 MG tablet Take 1 tablet (20 mg total) by mouth daily. 90 tablet 1  . furosemide (LASIX) 20 MG tablet Take 20 mg by mouth daily.    . Glucosamine-Chondroitin (COSAMIN DS PO) Take 1 tablet by mouth 2 (two) times daily.    [name] losartan (COZAAR) 25 MG tablet Take 1 tablet (25 mg total) by mouth daily. 90 tablet 3  . losartan (COZAAR) 25 MG tablet Take 25 mg by  mouth daily.    . metoprolol succinate (TOPROL-XL) 50 MG 24 hr tablet Take 2 tablets (100 mg total) by mouth 2 (two) times daily. Take with or immediately following a meal. 210 tablet 1  . metoprolol succinate (TOPROL-XL) 50 MG 24 hr tablet Take 75 mg by mouth daily. Take with or immediately following a meal.    . Multiple Vitamins-Minerals (MULTIVITAL-M) TABS Take 1 tablet by mouth daily.     . Potassium Chloride ER 20 MEQ TBCR Take 20 mEq by mouth daily.     . potassium chloride SA (K-DUR,KLOR-CON) 20 MEQ tablet Take 1 tablet (20 mEq total) by mouth daily. 90 tablet 1  . Probiotic Product (PROBIOTIC FORMULA PO) Take 1 capsule by mouth 2 (two) times daily.     [name] warfarin (COUMADIN) 5 MG tablet Take as directed by Coumadin Clinic (Patient taking differently: Take 5-7.5 mg by mouth See admin instructions. Take 7.5 mg by mouth daily in the evening on Mon, Wed, Fri, and Sat and take 5 mg daily in the evening on Sun, Tues, and Thur) 120 tablet 1  . hydrocortisone (ANUSOL-HC) 2.5 % rectal cream Place 1 application rectally 2 (two) times daily. (Patient not taking: Reported on 03/16/2018) 30 g 0  . loratadine (CLARITIN) 10 MG tablet Take 10 mg by mouth daily as needed for allergies.      No current facility-administered medications for this encounter.     BP 140/80   Pulse 99   Wt 93.3 kg (205 lb 9.6 oz)   SpO2 98%   BMI 31.26 kg/m  General: NAD Neck: No JVD, no thyromegaly or thyroid nodule.  Lungs: Clear to auscultation bilaterally with normal respiratory effort. CV: Nondisplaced PMI.  Heart irregular S1/S2, no S3/S4, 2/6 early SEM RUSB.  No peripheral edema.  No carotid bruit.  Normal pedal pulses.  Abdomen: Soft, nontender, no hepatosplenomegaly, no distention.  Skin: Intact without lesions or rashes.  Neurologic: Alert and oriented x 3.  Psych: Normal affect. Extremities: No clubbing or cyanosis.  HEENT: Normal.   Assessment/Plan: 1. Atypical atrial flutter: Patient had a MAZE procedure with LAA ligation at the time of his valve replacement.  Since then, he has had occasional episodes of atypical atrial flutter. He had episodes in 10/13 and 3/15 that converted back to NSR on dronedarone and ranolazine. He was noted to be back in atrial flutter in 6/15 in the setting of a CVA.  He had ablation at [facility] in 11/15.  Atrial flutter recurred after ranolazine was stopped and he had DCCV again in 4/16.  Atrial flutter recurred in 2/19, DCCV  back to NSR but atrial flutter quickly recurred. Tikosyn was then loaded and he was cardioverted back to NSR in 4/19.  He required DCCV again in 6/19 and in 12/19 (required 4 shocks).  He went back to atrial flutter about 3 days after 12/19 DCCV and is in rate-controlled flutter today.  - Continue coumadin.  - Continue current Toprol XL. - Continue Tikosyn for now.  QTc ok on ECG today. I think he is going to be unlikely to hold NSR without an ablation.  His left atrium is severely enlarged.  He wants to have evaluation for ablation at [facility] where he had his 11/15 ablation.  I discussed the situation with Dr. Rayann Heman.  We will have him continue Tikosyn and get an appointment as soon as feasible at [facility].  If they do not offer him repeat ablation, he will need to  stop Tikosyn.  2. 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  Last echo at [facility] in 9/19 showed normal bioprosthetic aortic valve, but mean gradient was up to 9 mmHg across the mitral valve.  Our echo done here in 12/19 showed normal bioprosthetic aortic valve and bioprosthetic mitral valve with mean gradient 4 mmHg and no [name].    3. HTN: BP needs good control given ascending aortic aneurysm. BP high today but rushed in, SBP 120s when he checks at home.  - Continue losartan and Toprol XL.  4. Hyperlipidemia: Good lipids in 10/19.      5. CVA: Suspect this was related to atrial flutter (despite LA appendage ligation).  He is now on coumadin. No residual neurologic deficits.  Continue long-term anticoagulation. - If has to stop coumadin in the future, should get Lovenox bridging.  6. Thoracic aortic aneurysm: Dilated aortic root at sinuses of valsalva and ascending aorta to 5.3 cm, last CTA in 9/19.   - Close followup, may need aortic root/ascending aorta replacement if ascending aorta expands further.  Getting serial CTs at [facility].    - Continue Toprol XL given some evidence  that beta blockers may attenuate thoracic aortic aneurysm growth.  7. Chronic diastolic CHF: Patient is not volume overloaded on exam.   - Continue Lasix 20 mg daily.  8. Pre-operative evaluation: Patient will need right THR with Dr Alvan Dame later this month.  I think that his cardiac disease is stable enough that surgical risk is not prohibitive.  - He will need a Lovenox bridge while off warfarin.  - Continue Toprol XL peri-operatively.   Followup 2 months.    Loralie Champagne 03/18/2018

## 2018-03-19 ENCOUNTER — Telehealth (HOSPITAL_COMMUNITY): Payer: Self-pay

## 2018-03-19 NOTE — Telephone Encounter (Signed)
Pt called to report that he has 2 appts coming up in March 2020 with Dr. Laurann Montana and Dr. Boyd Kerbs at the [facility].

## 2018-03-21 ENCOUNTER — Ambulatory Visit (INDEPENDENT_AMBULATORY_CARE_PROVIDER_SITE_OTHER): Payer: Medicare Other | Admitting: Pharmacist

## 2018-03-21 DIAGNOSIS — I4892 Unspecified atrial flutter: Secondary | ICD-10-CM

## 2018-03-21 DIAGNOSIS — Z7901 Long term (current) use of anticoagulants: Secondary | ICD-10-CM | POA: Diagnosis not present

## 2018-03-21 DIAGNOSIS — I482 Chronic atrial fibrillation, unspecified: Secondary | ICD-10-CM

## 2018-03-21 DIAGNOSIS — Z5181 Encounter for therapeutic drug level monitoring: Secondary | ICD-10-CM

## 2018-03-21 LAB — POCT INR: INR: 3.7 — AB (ref 2.0–3.0)

## 2018-03-21 MED ORDER — ENOXAPARIN SODIUM 150 MG/ML ~~LOC~~ SOLN: 150.0000 mg | SUBCUTANEOUS | 1 refills | Status: DC

## 2018-03-21 NOTE — Patient Instructions (Addendum)
Description   Skip your Coumadin today, then continue taking 1.5 tablets daily except 1 tablet on Tuesdays, Thursdays and Sundays. Your last dose of warfarin is on 1/22, then follow separate Lovenox instructions. Recheck INR in 4 weeks. Call and let us know if scheduled for any procedures [phone] or medication changes.    1/22: Last dose of Coumadin.  1/23: Inject Lovenox 150mg  in the fatty abdominal tissue at least 2 inches from the belly button at 11pm, rotate sites. No Coumadin.  1/24: Inject Lovenox in the fatty tissue at 11pm. No Coumadin.  1/25: Inject Lovenox in the fatty tissue at 11pm. No Coumadin.  1/26: Inject Lovenox in the fatty tissue 11pm. No Coumadin.  1/27: No Coumadin or Lovenox.  1/28: Procedure Day - No Lovenox - Resume Coumadin in the evening or as directed by doctor.  1/29: Resume Lovenox inject in the fatty tissue at 8am and take Coumadin.  1/30: Inject Lovenox in the fatty tissue at 8am and take Coumadin.  1/31: Inject Lovenox in the fatty tissue at 8am and take Coumadin.  2/1: Inject Lovenox in the fatty tissue at 8am and take Coumadin.  2/2: Inject Lovenox in the fatty tissue at 8am and take Coumadin..  2/3: Coumadin appt to check INR.

## 2018-03-22 ENCOUNTER — Encounter: Payer: Self-pay | Admitting: Internal Medicine

## 2018-03-22 ENCOUNTER — Encounter

## 2018-03-22 ENCOUNTER — Ambulatory Visit (INDEPENDENT_AMBULATORY_CARE_PROVIDER_SITE_OTHER): Payer: Medicare Other | Admitting: Internal Medicine

## 2018-03-22 VITALS — BP 130/80 | HR 99 | Temp 98.6°F | Resp 16 | Ht 68.0 in | Wt 206.5 lb

## 2018-03-22 DIAGNOSIS — Z23 Encounter for immunization: Secondary | ICD-10-CM | POA: Diagnosis not present

## 2018-03-22 DIAGNOSIS — I10 Essential (primary) hypertension: Secondary | ICD-10-CM

## 2018-03-22 DIAGNOSIS — R7303 Prediabetes: Secondary | ICD-10-CM | POA: Diagnosis not present

## 2018-03-22 DIAGNOSIS — I482 Chronic atrial fibrillation, unspecified: Secondary | ICD-10-CM

## 2018-03-22 LAB — POCT GLYCOSYLATED HEMOGLOBIN (HGB A1C): Hemoglobin A1C: 6.1 % — AB (ref 4.0–5.6)

## 2018-03-22 NOTE — Progress Notes (Signed)
Subjective:  Patient ID: BAY WAYSON, male    DOB: [birth year]  Age: 78 y.o. MRN: [id number]  CC: Hypertension and Atrial Fibrillation   HPI CHARVIS LIGHTNER presents for f/up - He complains of chronic, worsening hip plain and is scheduled to have surgery soon.  He recently got cardiac clearance to have the surgery.   Outpatient Medications Prior to Visit  Medication Sig Dispense Refill  . atorvastatin (LIPITOR) 80 MG tablet Take 1 tablet (80 mg total) by mouth every evening. 90 tablet 1  . clobetasol cream (TEMOVATE) 0.35 % Apply 1 application topically daily as needed (for skin irritation).     . Coenzyme Q10 (CO Q-10) 300 MG CAPS Take 300 mg by mouth daily.    . Cyanocobalamin (VITAMIN B-12) 5000 MCG SUBL Place 5,000 mcg under the tongue daily at 2 PM.     . docusate sodium (COLACE) 100 MG capsule Take 100 mg by mouth daily.    [name] dofetilide (TIKOSYN) 500 MCG capsule Take 1 capsule (500 mcg total) by mouth 2 (two) times daily. 180 capsule 1  . enoxaparin (LOVENOX) 150 MG/ML injection Inject 1 mL (150 mg total) into the skin daily. 10 Syringe 1  . fish oil-omega-3 fatty acids 1000 MG capsule Take 1 g by mouth 2 (two) times daily.     . furosemide (LASIX) 20 MG tablet Take 1 tablet (20 mg total) by mouth daily. 90 tablet 1  . Glucosamine-Chondroitin (COSAMIN DS PO) Take 1 tablet by mouth 2 (two) times daily.    [name] loratadine (CLARITIN) 10 MG tablet Take 10 mg by mouth daily as needed for allergies.     [name] losartan (COZAAR) 25 MG tablet Take 1 tablet (25 mg total) by mouth daily. 90 tablet 3  . metoprolol succinate (TOPROL-XL) 50 MG 24 hr tablet Take 75 mg by mouth 2 (two) times daily. Take with or immediately following a meal.    . Multiple Vitamins-Minerals (MULTIVITAL-M) TABS Take 1 tablet by mouth daily.     . potassium chloride SA (K-DUR,KLOR-CON) 20 MEQ tablet Take 1 tablet (20 mEq total) by mouth daily. 90 tablet 1  . Probiotic Product (PROBIOTIC FORMULA PO) Take 1 capsule by mouth 2  (two) times daily.     [name] warfarin (COUMADIN) 5 MG tablet Take as directed by Coumadin Clinic (Patient taking differently: Take 5-7.5 mg by mouth See admin instructions. Take 7.5 mg by mouth daily in the evening on Monday, Wednesday, Friday, and Saturday. Take 5 mg by mouth daily on all other days in the evening) 120 tablet 1   No facility-administered medications prior to visit.     ROS Review of Systems  Constitutional: Negative for diaphoresis and fatigue.  HENT: Negative.   Respiratory: Negative for cough, shortness of breath and wheezing.   Cardiovascular: Negative for chest pain, palpitations and leg swelling.  Gastrointestinal: Negative for abdominal pain, blood in stool, diarrhea, nausea and vomiting.  Endocrine: Negative.   Genitourinary: Negative.  Negative for difficulty urinating, dysuria, hematuria and urgency.  Musculoskeletal: Positive for arthralgias. Negative for back pain and myalgias.  Skin: Negative.  Negative for pallor.  Neurological: Negative.  Negative for dizziness, weakness, light-headedness and headaches.  Hematological: Negative for adenopathy. Does not bruise/bleed easily.  Psychiatric/Behavioral: Negative.     Objective:  BP 130/80 (BP Location: Left Arm, Patient Position: Sitting, Cuff Size: Normal)   Pulse 99   Temp 98.6 F (37 C) (Oral)   Resp 16   Ht 5\' 8"  (  1.727 m)   Wt 206 lb 8 oz (93.7 kg)   SpO2 96%   BMI 31.40 kg/m   BP Readings from Last 3 Encounters:  03/22/18 130/80  03/16/18 140/80  02/14/18 106/68    Wt Readings from Last 3 Encounters:  03/22/18 206 lb 8 oz (93.7 kg)  03/16/18 205 lb 9.6 oz (93.3 kg)  02/06/18 203 lb (92.1 kg)    Physical Exam Constitutional:      Appearance: He is obese.  HENT:     Nose: Nose normal. No congestion or rhinorrhea.     Mouth/Throat:     Mouth: Mucous membranes are moist.     Pharynx: Oropharynx is clear. No oropharyngeal exudate or posterior oropharyngeal erythema.  Eyes:     General: No  scleral icterus.    Conjunctiva/sclera: Conjunctivae normal.  Neck:     Musculoskeletal: Normal range of motion and neck supple.  Cardiovascular:     Rate and Rhythm: Normal rate. Rhythm irregularly irregular.     Heart sounds: Murmur present. Systolic murmur present with a grade of 1/6. No diastolic murmur. No gallop.   Pulmonary:     Effort: Pulmonary effort is normal.     Breath sounds: No wheezing, rhonchi or rales.  Abdominal:     General: Bowel sounds are normal.     Palpations: There is no hepatomegaly, splenomegaly or mass.     Tenderness: There is no abdominal tenderness.     Hernia: No hernia is present.  Musculoskeletal:     Right lower leg: No edema.     Left lower leg: No edema.  Skin:    General: Skin is warm and dry.     Coloration: Skin is not pale.  Neurological:     General: No focal deficit present.     Mental Status: He is alert and oriented to person, place, and time. Mental status is at baseline.     Lab Results  Component Value Date   WBC 8.7 03/16/2018   HGB 14.2 03/16/2018   HCT 43.9 03/16/2018   PLT 247 03/16/2018   GLUCOSE 123 (H) 03/16/2018   CHOL 150 12/29/2017   TRIG 124 12/29/2017   HDL 41 12/29/2017   LDLCALC 84 12/29/2017   ALT 45 02/07/2014   AST 35 02/07/2014   NA 138 03/16/2018   K 4.2 03/16/2018   CL 105 03/16/2018   CREATININE 1.06 03/16/2018   BUN 16 03/16/2018   CO2 25 03/16/2018   TSH 2.82 05/22/2017   PSA 5.87 10/12/2017   INR 3.7 (A) 03/21/2018   HGBA1C 6.1 (A) 03/22/2018    No results found.  Assessment & Plan:   Demetrias was seen today for hypertension and atrial fibrillation.  Diagnoses and all orders for this visit:  Prediabetes- His A1c is at 6.1%.  Medical therapy is not indicated.  He was encouraged to improve his lifestyle modifications. -     POCT glycosylated hemoglobin (Hb A1C)  Need for Tdap vaccination -     Tdap vaccine greater than or equal to 7yo IM  Chronic a-fib- He has good rate control.  He  is anticoagulated with Coumadin.  He will stop Coumadin 5 days prior to the surgery and will do a Lovenox bridge.  Essential hypertension- His blood pressure is adequately well controlled.  Recent electrolytes and renal function were normal.   I am having Youlanda Roys. Ledet maintain his fish oil-omega-3 fatty acids, Probiotic Product (PROBIOTIC FORMULA PO), loratadine, clobetasol cream, docusate sodium,  Glucosamine-Chondroitin (COSAMIN DS PO), Vitamin B-12, Co Q-10, MULTIVITAL-M, warfarin, atorvastatin, dofetilide, furosemide, losartan, potassium chloride SA, metoprolol succinate, and enoxaparin.  No orders of the defined types were placed in this encounter.    Follow-up: No follow-ups on file.  Scarlette Calico, MD

## 2018-03-23 ENCOUNTER — Encounter: Payer: Self-pay | Admitting: Internal Medicine

## 2018-03-23 NOTE — Patient Instructions (Signed)

## 2018-03-28 NOTE — Progress Notes (Signed)
CARDIAC CLEARANCE DR. Spindale DR. Iredell JONES 03-22-2018 ON CHART  EKG 03-16-2018 EPIC HGBA1C 03-22-18, PTINR 03-21-18 EPIC ECHO 02-22-18 Epic  STRESS TEST 02-20-17 EPIC

## 2018-03-28 NOTE — Patient Instructions (Signed)
[name] [name]  03/28/2018   Your procedure is scheduled on: 04-03-2018   Report to [facility] Main  Entrance    Report to admitting at 9:00AM    Call this number if you have problems the morning of surgery [phone]      Remember: Do not eat food or drink liquids :After Midnight. BRUSH YOUR TEETH MORNING OF SURGERY AND RINSE YOUR MOUTH OUT, NO CHEWING GUM CANDY OR MINTS.     Take these medicines the morning of surgery with A SIP OF WATER: TIKOSYN, CLARITIN, METOPROLOL                                You may not have any metal on your body including hair pins and              piercings  Do not wear jewelry, make-up, lotions, powders or perfumes, deodorant                    Men may shave face and neck.   Do not bring valuables to the hospital. [name].  Contacts, dentures or bridgework may not be worn into surgery.  Leave suitcase in the car. After surgery it may be brought to your room.                Please read over the following fact sheets you were given: _____________________________________________________________________             [facility] - Preparing for Surgery Before surgery, you can play an important role.  Because skin is not sterile, your skin needs to be as free of germs as possible.  You can reduce the number of germs on your skin by washing with CHG (chlorahexidine gluconate) soap before surgery.  CHG is an antiseptic cleaner which kills germs and bonds with the skin to continue killing germs even after washing. Please DO NOT use if you have an allergy to CHG or antibacterial soaps.  If your skin becomes reddened/irritated stop using the CHG and inform your nurse when you arrive at Short Stay. Do not shave (including legs and underarms) for at least 48 hours prior to the first CHG shower.  You may shave your face/neck. Please follow these instructions carefully:  1.  Shower with  CHG Soap the night before surgery and the  morning of Surgery.  2.  If you choose to wash your hair, wash your hair first as usual with your  normal  shampoo.  3.  After you shampoo, rinse your hair and body thoroughly to remove the  shampoo.                           4.  Use CHG as you would any other liquid soap.  You can apply chg directly  to the skin and wash                       Gently with a scrungie or clean washcloth.  5.  Apply the CHG Soap to your body ONLY FROM THE NECK DOWN.   Do not use on face/ open  Wound or open sores. Avoid contact with eyes, ears mouth and genitals (private parts).                       Wash face,  Genitals (private parts) with your normal soap.             6.  Wash thoroughly, paying special attention to the area where your surgery  will be performed.  7.  Thoroughly rinse your body with warm water from the neck down.  8.  DO NOT shower/wash with your normal soap after using and rinsing off  the CHG Soap.                9.  Pat yourself dry with a clean towel.            10.  Wear clean pajamas.            11.  Place clean sheets on your bed the night of your first shower and do not  sleep with pets. Day of Surgery : Do not apply any lotions/deodorants the morning of surgery.  Please wear clean clothes to the hospital/surgery center.  FAILURE TO FOLLOW THESE INSTRUCTIONS MAY RESULT IN THE CANCELLATION OF YOUR SURGERY PATIENT SIGNATURE_________________________________  NURSE SIGNATURE__________________________________  ________________________________________________________________________   [name] [name]  An incentive spirometer is a tool that can help keep your lungs clear and active. This tool measures how well you are filling your lungs with each breath. Taking long deep breaths may help reverse or decrease the chance of developing breathing (pulmonary) problems (especially infection) following:  A long period of  time when you are unable to move or be active. BEFORE THE PROCEDURE   If the spirometer includes an indicator to show your best effort, your nurse or respiratory therapist will set it to a desired goal.  If possible, sit up straight or lean slightly forward. Try not to slouch.  Hold the incentive spirometer in an upright position. INSTRUCTIONS FOR USE  1. Sit on the edge of your bed if possible, or sit up as far as you can in bed or on a chair. 2. Hold the incentive spirometer in an upright position. 3. Breathe out normally. 4. Place the mouthpiece in your mouth and seal your lips tightly around it. 5. Breathe in slowly and as deeply as possible, raising the piston or the ball toward the top of the column. 6. Hold your breath for 3-5 seconds or for as long as possible. Allow the piston or ball to fall to the bottom of the column. 7. Remove the mouthpiece from your mouth and breathe out normally. 8. Rest for a few seconds and repeat Steps 1 through 7 at least 10 times every 1-2 hours when you are awake. Take your time and take a few normal breaths between deep breaths. 9. The spirometer may include an indicator to show your best effort. Use the indicator as a goal to work toward during each repetition. 10. After each set of 10 deep breaths, practice coughing to be sure your lungs are clear. If you have an incision (the cut made at the time of surgery), support your incision when coughing by placing a pillow or rolled up towels firmly against it. Once you are able to get out of bed, walk around indoors and cough well. You may stop using the incentive spirometer when instructed by your caregiver.  RISKS AND COMPLICATIONS  Take your time so you do not get  dizzy or light-headed.  If you are in pain, you may need to take or ask for pain medication before doing incentive spirometry. It is harder to take a deep breath if you are having pain. AFTER USE  Rest and breathe slowly and easily.  It can  be helpful to keep track of a log of your progress. Your caregiver can provide you with a simple table to help with this. If you are using the spirometer at home, follow these instructions: [place] IF:   You are having difficultly using the spirometer.  You have trouble using the spirometer as often as instructed.  Your pain medication is not giving enough relief while using the spirometer.  You develop fever of 100.5 F (38.1 C) or higher. SEEK IMMEDIATE MEDICAL CARE IF:   You cough up bloody sputum that had not been present before.  You develop fever of 102 F (38.9 C) or greater.  You develop worsening pain at or near the incision site. MAKE SURE YOU:   Understand these instructions.  Will watch your condition.  Will get help right away if you are not doing well or get worse. Document Released: 07/04/2006 Document Revised: 05/16/2011 Document Reviewed: 09/04/2006 ExitCare Patient Information 2014 ExitCare, [place].   ________________________________________________________________________  WHAT IS A BLOOD TRANSFUSION? Blood Transfusion Information  A transfusion is the replacement of blood or some of its parts. Blood is made up of multiple cells which provide different functions.  Red blood cells carry oxygen and are used for blood loss replacement.  White blood cells fight against infection.  Platelets control bleeding.  Plasma helps clot blood.  Other blood products are available for specialized needs, such as hemophilia or other clotting disorders. BEFORE THE TRANSFUSION  Who gives blood for transfusions?   Healthy volunteers who are fully evaluated to make sure their blood is safe. This is blood bank blood. Transfusion therapy is the safest it has ever been in the practice of medicine. Before blood is taken from a donor, a complete history is taken to make sure that person has no history of diseases nor engages in risky social behavior (examples are  intravenous drug use or sexual activity with multiple partners). The donor's travel history is screened to minimize risk of transmitting infections, such as malaria. The donated blood is tested for signs of infectious diseases, such as HIV and hepatitis. The blood is then tested to be sure it is compatible with you in order to minimize the chance of a transfusion reaction. If you or a relative donates blood, this is often done in anticipation of surgery and is not appropriate for emergency situations. It takes many days to process the donated blood. RISKS AND COMPLICATIONS Although transfusion therapy is very safe and saves many lives, the main dangers of transfusion include:   Getting an infectious disease.  Developing a transfusion reaction. This is an allergic reaction to something in the blood you were given. Every precaution is taken to prevent this. The decision to have a blood transfusion has been considered carefully by your caregiver before blood is given. Blood is not given unless the benefits outweigh the risks. AFTER THE TRANSFUSION  Right after receiving a blood transfusion, you will usually feel much better and more energetic. This is especially true if your red blood cells have gotten low (anemic). The transfusion raises the level of the red blood cells which carry oxygen, and this usually causes an energy increase.  The nurse administering the transfusion will  monitor you carefully for complications. HOME CARE INSTRUCTIONS  No special instructions are needed after a transfusion. You may find your energy is better. Speak with your caregiver about any limitations on activity for underlying diseases you may have. SEEK MEDICAL CARE IF:   Your condition is not improving after your transfusion.  You develop redness or irritation at the intravenous (IV) site. SEEK IMMEDIATE MEDICAL CARE IF:  Any of the following symptoms occur over the next 12 hours:  Shaking chills.  You have a  temperature by mouth above 102 F (38.9 C), not controlled by medicine.  Chest, back, or muscle pain.  People around you feel you are not acting correctly or are confused.  Shortness of breath or difficulty breathing.  Dizziness and fainting.  You get a rash or develop hives.  You have a decrease in urine output.  Your urine turns a dark color or changes to pink, red, or brown. Any of the following symptoms occur over the next 10 days:  You have a temperature by mouth above 102 F (38.9 C), not controlled by medicine.  Shortness of breath.  Weakness after normal activity.  The white part of the eye turns yellow (jaundice).  You have a decrease in the amount of urine or are urinating less often.  Your urine turns a dark color or changes to pink, red, or brown. Document Released: 02/19/2000 Document Revised: 05/16/2011 Document Reviewed: 10/08/2007 [facility] Patient Information 2014 [place], [place].  _______________________________________________________________________

## 2018-03-29 ENCOUNTER — Encounter (HOSPITAL_COMMUNITY)
Admission: RE | Admit: 2018-03-29 | Discharge: 2018-03-29 | Disposition: A | Discharge status: Home / Self Care | Payer: Medicare Other | Source: Ambulatory Visit | Attending: Orthopedic Surgery | Admitting: Orthopedic Surgery

## 2018-03-29 ENCOUNTER — Encounter (HOSPITAL_COMMUNITY): Payer: Self-pay

## 2018-03-29 ENCOUNTER — Other Ambulatory Visit: Payer: Self-pay

## 2018-03-29 DIAGNOSIS — M1611 Unilateral primary osteoarthritis, right hip: Secondary | ICD-10-CM | POA: Diagnosis not present

## 2018-03-29 DIAGNOSIS — Z01812 Encounter for preprocedural laboratory examination: Secondary | ICD-10-CM | POA: Diagnosis not present

## 2018-03-29 HISTORY — DX: Thoracic aortic aneurysm, without rupture: I71.2

## 2018-03-29 HISTORY — DX: Prediabetes: R73.03

## 2018-03-29 HISTORY — DX: Thoracic aortic aneurysm, without rupture, unspecified: I71.20

## 2018-03-29 LAB — BASIC METABOLIC PANEL
Anion gap: 8 (ref 5–15)
BUN: 19 mg/dL (ref 8–23)
CHLORIDE: 105 mmol/L (ref 98–111)
CO2: 26 mmol/L (ref 22–32)
Calcium: 9.3 mg/dL (ref 8.9–10.3)
Creatinine, Ser: 0.94 mg/dL (ref 0.61–1.24)
GFR calc Af Amer: >60 mL/min (ref >60)
GFR calc non Af Amer: >60 mL/min (ref >60)
Glucose, Bld: 119 mg/dL — ABNORMAL HIGH (ref 70–99)
POTASSIUM: 4.5 mmol/L (ref 3.5–5.1)
Sodium: 139 mmol/L (ref 135–145)

## 2018-03-29 LAB — CBC
HCT: 44.8 % (ref 39.0–52.0)
Hemoglobin: 14.5 g/dL (ref 13.0–17.0)
MCH: 30.8 pg (ref 26.0–34.0)
MCHC: 32.4 g/dL (ref 30.0–36.0)
MCV: 95.1 fL (ref 80.0–100.0)
Platelets: 259 10*3/uL (ref 150–400)
RBC: 4.71 MIL/uL (ref 4.22–5.81)
RDW: 13.2 % (ref 11.5–15.5)
WBC: 7.8 10*3/uL (ref 4.0–10.5)
nRBC: 0 % (ref 0.0–0.2)

## 2018-03-29 LAB — SURGICAL PCR SCREEN
MRSA, PCR: POSITIVE — AB
Staphylococcus aureus: POSITIVE — AB

## 2018-03-29 LAB — ABO/RH: ABO/RH(D): A POS

## 2018-03-30 ENCOUNTER — Other Ambulatory Visit: Payer: Self-pay | Admitting: *Deleted

## 2018-03-30 MED ORDER — WARFARIN SODIUM 5 MG PO TABS: ORAL_TABLET | ORAL | 1 refills | Status: DC

## 2018-03-30 MED ORDER — METOPROLOL SUCCINATE ER 25 MG PO TB24: 75.0000 mg | ORAL_TABLET | Freq: Two times a day (BID) | ORAL | 3 refills | Status: DC

## 2018-03-30 NOTE — Progress Notes (Signed)
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 Paralee Cancel.   Pt last seen by PCP, Dr. Scarlette Calico, on 03/22/18. Dr. Ronnald Ramp aware of upcoming surgery.  Per his note HTN well controlled, A-fib with good rate control, stable.    Plast seen by cardiology, Dr. Loralie Champagne, on 03/16/18.  Per his note, "Pre-operative evaluation: Patient will need right THR with Dr Alvan Dame later this month.  I think that his cardiac disease is stable enough that surgical risk is not prohibitive. He will need a Lovenox bridge while off warfarin. Continue Toprol XL peri-operatively."  Instructions for stopping coumadin and started Lovenox bridge given to pt as outlined by Fuller Canada, [place] on 03/21/18.   Per results on Echo 02/22/18 Dr. Loralie Champagne states, "Normal EF, bioprosthetic valves look ok. 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 He should be stable for his orthopedic surgery."  Pt can proceed with planned procedure barring acute status change.  VS: BP 102/85   Pulse 89   Temp 36.9 C (Oral)   Resp 16   SpO2 96%   PROVIDERS: [name], MD is PCP  Loralie Champagne, MD is Cardiologist  LABS: Labs reviewed: Acceptable for surgery. (all labs ordered are listed, but only abnormal results are displayed)  Labs Reviewed  SURGICAL PCR SCREEN - Abnormal; Notable for the following components:      Result Value   MRSA, PCR POSITIVE (*)    Staphylococcus aureus POSITIVE (*)    All other components within normal limits  BASIC METABOLIC PANEL - Abnormal; Notable for the following components:   Glucose, Bld 119 (*)    All other components within normal limits  CBC  TYPE AND SCREEN  ABO/RH     IMAGES:   EKG: 03/16/2018 Rate 90bpm Atrial flutter with variable A-V block  Nonspecific ST abnormaility Abnormal ECG Since previous tracing atrial flutter is new  CV: Echo 02/22/18 Study Conclusions  - Left ventricle: The cavity size was normal. Wall thickness was   increased in a pattern of mild LVH. Systolic function was normal.   The estimated ejection fraction was in the range of 60% to 65%.   Wall motion was normal; there were no regional wall motion   abnormalities. - Aortic valve: A bioprosthesis was present. - Aortic root: The aortic root was moderately dilated. - Ascending aorta: The ascending aorta was mildly dilated. - Mitral valve: A bioprosthesis was present. - Left atrium: The atrium was severely dilated.  Impressions:  - Normal LV systolic function; mild LVH; s/p AVR with no AS or AI;   moderately dilated aortic root (4.8 cm); suggest CTA to further   assess; s/p MVR with normal mean gradient (4 mmHg) and no MR;   severe LAE.  Stress Test 02/20/17  Nuclear stress EF: 47%. No wall motion abnormalities  There was no ST segment deviation noted during stress.  Defect 1: There  is a small defect of mild severity present in the apex location. No ischemia identified  The study is normal.  This is a low risk study. Past Medical History:  Diagnosis Date  . Acute rheumatic heart disease, unspecified    childhood, age  64 & 71  . Acute rheumatic pericarditis   . Atrial fibrillation ([place])    history  . Diverticulosis   . 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 External hemorrhoids without mention of complication   . 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 Lesion of ulnar nerve   . 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 Other and unspecified hyperlipidemia   . Pre-diabetes   . Previous back surgery 1978, jan 2007  . Psychosexual dysfunction with inhibited sexual excitement   . SOB (shortness of breath)    "with heavy exercise"  . Stroke ([place]) 08/2013   "I WAS IN AFIB AND THREW A CLOT, THE EFFECTS WERE TRANSITORY AND DIDNT LAST BUT FOR 30 MINUTES"   . Thoracic aortic aneurysm [facility])     Past Surgical History:  Procedure Laterality Date  . AORTIC AND MITRAL VALVE REPLACEMENT     09/2004  . CARDIOVERSION     3 times from 2004-2006  . CARDIOVERSION N/A 09/26/2013   Procedure: CARDIOVERSION;  Surgeon: Larey Dresser, MD;  Location: [place];  Service: Cardiovascular;  Laterality: N/A;  . CARDIOVERSION N/A 06/19/2014   Procedure: CARDIOVERSION;  Surgeon: Jerline Pain, MD;  Location: [place];  Service: Cardiovascular;  Laterality: N/A;  . CARDIOVERSION N/A 04/24/2017   Procedure: CARDIOVERSION;  Surgeon: Larey Dresser, MD;  Location: [facility] ENDOSCOPY;  Service: Cardiovascular;  Laterality: N/A;  . CARDIOVERSION N/A 06/08/2017   Procedure: CARDIOVERSION;  Surgeon: Pixie Casino, MD;  Location: [facility] ENDOSCOPY;  Service: Cardiovascular;  Laterality: N/A;  . CARDIOVERSION N/A 08/24/2017   Procedure: CARDIOVERSION;  Surgeon: Skeet Latch, MD;  Location: [facility] ENDOSCOPY;  Service: Cardiovascular;  Laterality: N/A;  . CARDIOVERSION N/A 02/14/2018   Procedure: CARDIOVERSION;  Surgeon: Larey Dresser, MD;  Location: [facility] ENDOSCOPY;  Service: Cardiovascular;  Laterality: N/A;  . COLONOSCOPY    . laminectomies     10/1975 and in 03/2005  . TONSILLECTOMY     1950    MEDICATIONS: . atorvastatin (LIPITOR) 80 MG tablet  . clobetasol cream (TEMOVATE) 0.05 %  . Coenzyme Q10 (CO Q-10) 300 MG CAPS  . Cyanocobalamin (VITAMIN B-12) 5000 MCG SUBL  . docusate sodium (COLACE) 100 MG capsule  . dofetilide (TIKOSYN) 500 MCG capsule  . enoxaparin (LOVENOX) 150 MG/ML  injection  . fish oil-omega-3 fatty acids 1000 MG capsule  . furosemide (LASIX) 20 MG tablet  . Glucosamine-Chondroitin (COSAMIN DS PO)  . loratadine (CLARITIN) 10 MG tablet  . losartan (COZAAR) 25 MG tablet  . metoprolol succinate (TOPROL-XL) 25 MG 24 hr tablet  . Multiple Vitamins-Minerals (MULTIVITAL-M) TABS  . potassium chloride SA (K-DUR,KLOR-CON) 20 MEQ tablet  . Probiotic Product (PROBIOTIC FORMULA PO)  . warfarin (COUMADIN) 5 MG tablet   No current facility-administered medications for this encounter.     Maia Plan [facility] Pre-Surgical Testing [phone] 03/30/18 2:12 PM

## 2018-03-30 NOTE — Anesthesia Preprocedure Evaluation (Addendum)
Anesthesia Evaluation  Patient identified by MRN, date of birth, ID band Patient awake    Reviewed: Allergy & Precautions, H&P , NPO status , Patient's Chart, lab work & pertinent test results  Airway Mallampati: III  TM Distance: >3 FB Neck ROM: Full    Dental no notable dental hx. (+) Teeth Intact, Dental Advisory Given   Pulmonary neg pulmonary ROS,    Pulmonary exam normal breath sounds clear to auscultation       Cardiovascular hypertension, Pt. on medications and Pt. on home beta blockers + dysrhythmias Atrial Fibrillation  Rhythm:Irregular Rate:Normal     Neuro/Psych CVA, No Residual Symptoms negative psych ROS   GI/Hepatic negative GI ROS, Neg liver ROS,   Endo/Other  negative endocrine ROS  Renal/GU negative Renal ROS  negative genitourinary   Musculoskeletal   Abdominal   Peds  Hematology negative hematology ROS (+)   Anesthesia Other Findings   Reproductive/Obstetrics negative OB ROS                            Anesthesia Physical Anesthesia Plan  ASA: III  Anesthesia Plan: Spinal and MAC   Post-op Pain Management:    Induction: Intravenous  PONV Risk Score and Plan: 2 and Ondansetron, Dexamethasone and Propofol infusion  Airway Management Planned: Simple Face Mask  Additional Equipment:   Intra-op Plan:   Post-operative Plan:   Informed Consent: I have reviewed the patients History and Physical, chart, labs and discussed the procedure including the risks, benefits and alternatives for the proposed anesthesia with the patient or authorized representative who has indicated his/her understanding and acceptance.     Dental advisory given  Plan Discussed with: CRNA  Anesthesia Plan Comments: (See PST note 03/29/2018, Konrad Felix, PA-C)       Anesthesia Quick Evaluation

## 2018-04-02 ENCOUNTER — Other Ambulatory Visit (HOSPITAL_COMMUNITY): Payer: Self-pay | Admitting: *Deleted

## 2018-04-03 ENCOUNTER — Observation Stay (HOSPITAL_COMMUNITY): Payer: Medicare Other

## 2018-04-03 ENCOUNTER — Inpatient Hospital Stay (HOSPITAL_COMMUNITY): Payer: Medicare Other | Admitting: Certified Registered"

## 2018-04-03 ENCOUNTER — Inpatient Hospital Stay (HOSPITAL_COMMUNITY): Payer: Medicare Other

## 2018-04-03 ENCOUNTER — Encounter (HOSPITAL_COMMUNITY)
Admission: RE | Disposition: A | Discharge status: Home / Self Care | Payer: Self-pay | Source: Home / Self Care | Attending: Orthopedic Surgery

## 2018-04-03 ENCOUNTER — Other Ambulatory Visit: Payer: Self-pay

## 2018-04-03 ENCOUNTER — Other Ambulatory Visit (HOSPITAL_COMMUNITY): Payer: Self-pay

## 2018-04-03 ENCOUNTER — Encounter (HOSPITAL_COMMUNITY): Payer: Self-pay | Admitting: *Deleted

## 2018-04-03 ENCOUNTER — Observation Stay (HOSPITAL_COMMUNITY)
Admission: RE | Admit: 2018-04-03 | Discharge: 2018-04-05 | Disposition: A | Discharge status: Home / Self Care | Payer: Medicare Other | Attending: Orthopedic Surgery | Admitting: Orthopedic Surgery

## 2018-04-03 ENCOUNTER — Inpatient Hospital Stay (HOSPITAL_COMMUNITY): Payer: Medicare Other | Admitting: Physician Assistant

## 2018-04-03 DIAGNOSIS — Z6831 Body mass index (BMI) 31.0-31.9, adult: Secondary | ICD-10-CM | POA: Insufficient documentation

## 2018-04-03 DIAGNOSIS — Z96649 Presence of unspecified artificial hip joint: Secondary | ICD-10-CM

## 2018-04-03 DIAGNOSIS — Z96641 Presence of right artificial hip joint: Secondary | ICD-10-CM

## 2018-04-03 DIAGNOSIS — Z886 Allergy status to analgesic agent status: Secondary | ICD-10-CM | POA: Diagnosis not present

## 2018-04-03 DIAGNOSIS — Z471 Aftercare following joint replacement surgery: Secondary | ICD-10-CM | POA: Diagnosis not present

## 2018-04-03 DIAGNOSIS — I4891 Unspecified atrial fibrillation: Secondary | ICD-10-CM | POA: Insufficient documentation

## 2018-04-03 DIAGNOSIS — I4892 Unspecified atrial flutter: Secondary | ICD-10-CM | POA: Diagnosis not present

## 2018-04-03 DIAGNOSIS — Z7901 Long term (current) use of anticoagulants: Secondary | ICD-10-CM | POA: Insufficient documentation

## 2018-04-03 DIAGNOSIS — I10 Essential (primary) hypertension: Secondary | ICD-10-CM | POA: Insufficient documentation

## 2018-04-03 DIAGNOSIS — M1611 Unilateral primary osteoarthritis, right hip: Secondary | ICD-10-CM | POA: Diagnosis not present

## 2018-04-03 DIAGNOSIS — Z79899 Other long term (current) drug therapy: Secondary | ICD-10-CM | POA: Insufficient documentation

## 2018-04-03 DIAGNOSIS — E785 Hyperlipidemia, unspecified: Secondary | ICD-10-CM | POA: Diagnosis not present

## 2018-04-03 DIAGNOSIS — Z419 Encounter for procedure for purposes other than remedying health state, unspecified: Secondary | ICD-10-CM

## 2018-04-03 DIAGNOSIS — E669 Obesity, unspecified: Secondary | ICD-10-CM | POA: Insufficient documentation

## 2018-04-03 DIAGNOSIS — Z8673 Personal history of transient ischemic attack (TIA), and cerebral infarction without residual deficits: Secondary | ICD-10-CM | POA: Diagnosis not present

## 2018-04-03 DIAGNOSIS — R7303 Prediabetes: Secondary | ICD-10-CM | POA: Insufficient documentation

## 2018-04-03 DIAGNOSIS — Z952 Presence of prosthetic heart valve: Secondary | ICD-10-CM | POA: Insufficient documentation

## 2018-04-03 HISTORY — PX: TOTAL HIP ARTHROPLASTY: SHX124

## 2018-04-03 HISTORY — DX: Cardiac arrhythmia, unspecified: I49.9

## 2018-04-03 LAB — PROTIME-INR
INR: 0.88
Prothrombin Time: 11.9 seconds (ref 11.4–15.2)

## 2018-04-03 LAB — CREATININE, SERUM
Creatinine, Ser: 1.02 mg/dL (ref 0.61–1.24)
GFR calc Af Amer: >60 mL/min (ref >60)
GFR calc non Af Amer: >60 mL/min (ref >60)

## 2018-04-03 LAB — CBC
HCT: 40 % (ref 39.0–52.0)
Hemoglobin: 12.8 g/dL — ABNORMAL LOW (ref 13.0–17.0)
MCH: 31.6 pg (ref 26.0–34.0)
MCHC: 32 g/dL (ref 30.0–36.0)
MCV: 98.8 fL (ref 80.0–100.0)
Platelets: 204 10*3/uL (ref 150–400)
RBC: 4.05 MIL/uL — ABNORMAL LOW (ref 4.22–5.81)
RDW: 13.3 % (ref 11.5–15.5)
WBC: 15.9 10*3/uL — ABNORMAL HIGH (ref 4.0–10.5)
nRBC: 0 % (ref 0.0–0.2)

## 2018-04-03 LAB — TYPE AND SCREEN
ABO/RH(D): A POS
Antibody Screen: NEGATIVE

## 2018-04-03 SURGERY — ARTHROPLASTY, HIP, TOTAL, ANTERIOR APPROACH
Anesthesia: Monitor Anesthesia Care | Site: Hip | Laterality: Right

## 2018-04-03 MED ORDER — LORATADINE 10 MG PO TABS: 10.0000 mg | ORAL_TABLET | Freq: Every day | ORAL | Status: DC | PRN

## 2018-04-03 MED ORDER — VANCOMYCIN HCL IN DEXTROSE 1-5 GM/200ML-% IV SOLN
1000.0000 mg | Freq: Once | INTRAVENOUS | Status: AC
  Administered 2018-04-03: 1000 mg via INTRAVENOUS
  Filled 2018-04-03: qty 200

## 2018-04-03 MED ORDER — LACTATED RINGERS IV SOLN
INTRAVENOUS | Status: DC
  Administered 2018-04-03 (×3): via INTRAVENOUS

## 2018-04-03 MED ORDER — DIPHENHYDRAMINE HCL 12.5 MG/5ML PO ELIX: 12.5000 mg | ORAL_SOLUTION | ORAL | Status: DC | PRN

## 2018-04-03 MED ORDER — FENTANYL CITRATE (PF) 100 MCG/2ML IJ SOLN
INTRAMUSCULAR | Status: AC
  Filled 2018-04-03: qty 2

## 2018-04-03 MED ORDER — METOPROLOL SUCCINATE ER 25 MG PO TB24: 75.0000 mg | ORAL_TABLET | Freq: Two times a day (BID) | ORAL | 3 refills | Status: DC

## 2018-04-03 MED ORDER — METHOCARBAMOL 500 MG PO TABS
500.0000 mg | ORAL_TABLET | Freq: Four times a day (QID) | ORAL | Status: DC | PRN
  Administered 2018-04-04 – 2018-04-05 (×3): 500 mg via ORAL
  Filled 2018-04-03 (×3): qty 1

## 2018-04-03 MED ORDER — STERILE WATER FOR IRRIGATION IR SOLN
Status: DC | PRN
  Administered 2018-04-03: 2000 mL

## 2018-04-03 MED ORDER — ONDANSETRON HCL 4 MG PO TABS: 4.0000 mg | ORAL_TABLET | Freq: Four times a day (QID) | ORAL | Status: DC | PRN

## 2018-04-03 MED ORDER — HYDROCODONE-ACETAMINOPHEN 5-325 MG PO TABS
1.0000 | ORAL_TABLET | ORAL | Status: DC | PRN
  Administered 2018-04-03 (×2): 2 via ORAL
  Administered 2018-04-03: 1 via ORAL
  Administered 2018-04-04 – 2018-04-05 (×3): 2 via ORAL
  Filled 2018-04-03 (×4): qty 2
  Filled 2018-04-03: qty 1
  Filled 2018-04-03: qty 2

## 2018-04-03 MED ORDER — CHLORHEXIDINE GLUCONATE 4 % EX LIQD: 60.0000 mL | Freq: Once | CUTANEOUS | Status: DC

## 2018-04-03 MED ORDER — DOCUSATE SODIUM 100 MG PO CAPS
100.0000 mg | ORAL_CAPSULE | Freq: Two times a day (BID) | ORAL | Status: DC
  Administered 2018-04-03 – 2018-04-05 (×4): 100 mg via ORAL
  Filled 2018-04-03 (×4): qty 1

## 2018-04-03 MED ORDER — SODIUM CHLORIDE 0.9 % IV SOLN
INTRAVENOUS | Status: DC | PRN
  Administered 2018-04-03: 10 ug/min via INTRAVENOUS

## 2018-04-03 MED ORDER — PHENYLEPHRINE 40 MCG/ML (10ML) SYRINGE FOR IV PUSH (FOR BLOOD PRESSURE SUPPORT)
PREFILLED_SYRINGE | INTRAVENOUS | Status: AC
  Filled 2018-04-03: qty 20

## 2018-04-03 MED ORDER — PHENYLEPHRINE 40 MCG/ML (10ML) SYRINGE FOR IV PUSH (FOR BLOOD PRESSURE SUPPORT)
PREFILLED_SYRINGE | INTRAVENOUS | Status: AC
  Filled 2018-04-03: qty 10

## 2018-04-03 MED ORDER — ENOXAPARIN SODIUM 40 MG/0.4ML ~~LOC~~ SOLN
40.0000 mg | SUBCUTANEOUS | Status: DC
  Administered 2018-04-04 – 2018-04-05 (×2): 40 mg via SUBCUTANEOUS
  Filled 2018-04-03 (×2): qty 0.4

## 2018-04-03 MED ORDER — DEXAMETHASONE SODIUM PHOSPHATE 10 MG/ML IJ SOLN
10.0000 mg | Freq: Once | INTRAMUSCULAR | Status: AC
  Administered 2018-04-03: 10 mg via INTRAVENOUS

## 2018-04-03 MED ORDER — PROPOFOL 500 MG/50ML IV EMUL
INTRAVENOUS | Status: DC | PRN
  Administered 2018-04-03: 50 ug/kg/min via INTRAVENOUS

## 2018-04-03 MED ORDER — DEXAMETHASONE SODIUM PHOSPHATE 10 MG/ML IJ SOLN
INTRAMUSCULAR | Status: AC
  Filled 2018-04-03: qty 1

## 2018-04-03 MED ORDER — WARFARIN - PHARMACIST DOSING INPATIENT: Freq: Every day | Status: DC

## 2018-04-03 MED ORDER — METHOCARBAMOL 500 MG IVPB - SIMPLE MED
500.0000 mg | Freq: Four times a day (QID) | INTRAVENOUS | Status: DC | PRN
  Filled 2018-04-03: qty 50

## 2018-04-03 MED ORDER — MAGNESIUM CITRATE PO SOLN: 1.0000 | Freq: Once | ORAL | Status: DC | PRN

## 2018-04-03 MED ORDER — ACETAMINOPHEN 500 MG PO TABS
1000.0000 mg | ORAL_TABLET | Freq: Once | ORAL | Status: AC
  Administered 2018-04-03: 1000 mg via ORAL

## 2018-04-03 MED ORDER — SODIUM CHLORIDE 0.9 % IV SOLN
INTRAVENOUS | Status: DC
  Administered 2018-04-03: 16:00:00 via INTRAVENOUS

## 2018-04-03 MED ORDER — HYDROMORPHONE HCL 1 MG/ML IJ SOLN
0.2500 mg | INTRAMUSCULAR | Status: DC | PRN
  Administered 2018-04-03 (×3): 0.5 mg via INTRAVENOUS

## 2018-04-03 MED ORDER — ATORVASTATIN CALCIUM 40 MG PO TABS
80.0000 mg | ORAL_TABLET | Freq: Every evening | ORAL | Status: DC
  Administered 2018-04-03 – 2018-04-04 (×2): 80 mg via ORAL
  Filled 2018-04-03 (×2): qty 2

## 2018-04-03 MED ORDER — HYDROMORPHONE HCL 1 MG/ML IJ SOLN
INTRAMUSCULAR | Status: AC
  Filled 2018-04-03: qty 1

## 2018-04-03 MED ORDER — ACETAMINOPHEN 500 MG PO TABS
ORAL_TABLET | ORAL | Status: AC
  Filled 2018-04-03: qty 2

## 2018-04-03 MED ORDER — TRANEXAMIC ACID-NACL 1000-0.7 MG/100ML-% IV SOLN
1000.0000 mg | Freq: Once | INTRAVENOUS | Status: AC
  Administered 2018-04-03: 1000 mg via INTRAVENOUS
  Filled 2018-04-03: qty 100

## 2018-04-03 MED ORDER — MENTHOL 3 MG MT LOZG: 1.0000 | LOZENGE | OROMUCOSAL | Status: DC | PRN

## 2018-04-03 MED ORDER — METOCLOPRAMIDE HCL 5 MG PO TABS: 5.0000 mg | ORAL_TABLET | Freq: Three times a day (TID) | ORAL | Status: DC | PRN

## 2018-04-03 MED ORDER — CEFAZOLIN SODIUM-DEXTROSE 2-4 GM/100ML-% IV SOLN
2.0000 g | INTRAVENOUS | Status: AC
  Administered 2018-04-03: 2 g via INTRAVENOUS
  Filled 2018-04-03: qty 100

## 2018-04-03 MED ORDER — FERROUS SULFATE 325 (65 FE) MG PO TABS
325.0000 mg | ORAL_TABLET | Freq: Three times a day (TID) | ORAL | Status: DC
  Administered 2018-04-03 – 2018-04-05 (×5): 325 mg via ORAL
  Filled 2018-04-03 (×5): qty 1

## 2018-04-03 MED ORDER — DOFETILIDE 500 MCG PO CAPS: 500.0000 ug | ORAL_CAPSULE | Freq: Two times a day (BID) | ORAL | 1 refills | Status: DC

## 2018-04-03 MED ORDER — ONDANSETRON HCL 4 MG/2ML IJ SOLN: 4.0000 mg | Freq: Four times a day (QID) | INTRAMUSCULAR | Status: DC | PRN

## 2018-04-03 MED ORDER — CELECOXIB 200 MG PO CAPS
200.0000 mg | ORAL_CAPSULE | Freq: Two times a day (BID) | ORAL | Status: AC
  Administered 2018-04-03 (×2): 200 mg via ORAL
  Filled 2018-04-03 (×2): qty 1

## 2018-04-03 MED ORDER — HYDROMORPHONE HCL 1 MG/ML IJ SOLN
INTRAMUSCULAR | Status: AC
  Administered 2018-04-03: 0.5 mg via INTRAVENOUS
  Filled 2018-04-03: qty 1

## 2018-04-03 MED ORDER — PHENYLEPHRINE HCL 10 MG/ML IJ SOLN
INTRAMUSCULAR | Status: DC | PRN
  Administered 2018-04-03: 60 ug via INTRAVENOUS

## 2018-04-03 MED ORDER — ALUM & MAG HYDROXIDE-SIMETH 200-200-20 MG/5ML PO SUSP: 15.0000 mL | ORAL | Status: DC | PRN

## 2018-04-03 MED ORDER — PROPOFOL 10 MG/ML IV BOLUS
INTRAVENOUS | Status: AC
  Filled 2018-04-03: qty 40

## 2018-04-03 MED ORDER — HYDROMORPHONE HCL 1 MG/ML IJ SOLN: 0.5000 mg | INTRAMUSCULAR | Status: DC | PRN

## 2018-04-03 MED ORDER — TRANEXAMIC ACID-NACL 1000-0.7 MG/100ML-% IV SOLN
1000.0000 mg | INTRAVENOUS | Status: AC
  Administered 2018-04-03: 1000 mg via INTRAVENOUS
  Filled 2018-04-03: qty 100

## 2018-04-03 MED ORDER — ONDANSETRON HCL 4 MG/2ML IJ SOLN
INTRAMUSCULAR | Status: AC
  Filled 2018-04-03: qty 2

## 2018-04-03 MED ORDER — METHOCARBAMOL 500 MG IVPB - SIMPLE MED
INTRAVENOUS | Status: AC
  Filled 2018-04-03: qty 50

## 2018-04-03 MED ORDER — LIDOCAINE 2% (20 MG/ML) 5 ML SYRINGE
INTRAMUSCULAR | Status: AC
  Filled 2018-04-03: qty 10

## 2018-04-03 MED ORDER — FUROSEMIDE 20 MG PO TABS
20.0000 mg | ORAL_TABLET | Freq: Every day | ORAL | Status: DC
  Administered 2018-04-03 – 2018-04-05 (×3): 20 mg via ORAL
  Filled 2018-04-03 (×3): qty 1

## 2018-04-03 MED ORDER — PHENOL 1.4 % MT LIQD: 1.0000 | OROMUCOSAL | Status: DC | PRN

## 2018-04-03 MED ORDER — WARFARIN SODIUM 5 MG PO TABS
7.5000 mg | ORAL_TABLET | Freq: Once | ORAL | Status: AC
  Administered 2018-04-03: 7.5 mg via ORAL
  Filled 2018-04-03: qty 1

## 2018-04-03 MED ORDER — METOPROLOL SUCCINATE ER 50 MG PO TB24
75.0000 mg | ORAL_TABLET | Freq: Two times a day (BID) | ORAL | Status: DC
  Administered 2018-04-03 – 2018-04-05 (×4): 75 mg via ORAL
  Filled 2018-04-03 (×4): qty 1

## 2018-04-03 MED ORDER — POTASSIUM CHLORIDE CRYS ER 20 MEQ PO TBCR
20.0000 meq | EXTENDED_RELEASE_TABLET | Freq: Every day | ORAL | Status: DC
  Administered 2018-04-03 – 2018-04-05 (×3): 20 meq via ORAL
  Filled 2018-04-03 (×3): qty 1

## 2018-04-03 MED ORDER — ACETAMINOPHEN 325 MG PO TABS: 325.0000 mg | ORAL_TABLET | Freq: Four times a day (QID) | ORAL | Status: DC | PRN

## 2018-04-03 MED ORDER — HYDROCODONE-ACETAMINOPHEN 7.5-325 MG PO TABS: 1.0000 | ORAL_TABLET | ORAL | Status: DC | PRN

## 2018-04-03 MED ORDER — ONDANSETRON HCL 4 MG/2ML IJ SOLN
INTRAMUSCULAR | Status: DC | PRN
  Administered 2018-04-03: 4 mg via INTRAVENOUS

## 2018-04-03 MED ORDER — BISACODYL 10 MG RE SUPP: 10.0000 mg | Freq: Every day | RECTAL | Status: DC | PRN

## 2018-04-03 MED ORDER — DEXAMETHASONE SODIUM PHOSPHATE 10 MG/ML IJ SOLN
10.0000 mg | Freq: Once | INTRAMUSCULAR | Status: AC
  Administered 2018-04-04: 10 mg via INTRAVENOUS
  Filled 2018-04-03: qty 1

## 2018-04-03 MED ORDER — POLYETHYLENE GLYCOL 3350 17 G PO PACK
17.0000 g | PACK | Freq: Two times a day (BID) | ORAL | Status: DC
  Administered 2018-04-03 – 2018-04-05 (×4): 17 g via ORAL
  Filled 2018-04-03 (×4): qty 1

## 2018-04-03 MED ORDER — LIDOCAINE 2% (20 MG/ML) 5 ML SYRINGE
INTRAMUSCULAR | Status: AC
  Filled 2018-04-03: qty 5

## 2018-04-03 MED ORDER — METOCLOPRAMIDE HCL 5 MG/ML IJ SOLN: 5.0000 mg | Freq: Three times a day (TID) | INTRAMUSCULAR | Status: DC | PRN

## 2018-04-03 MED ORDER — DOFETILIDE 250 MCG PO CAPS
500.0000 ug | ORAL_CAPSULE | Freq: Two times a day (BID) | ORAL | Status: DC
  Administered 2018-04-03 – 2018-04-05 (×4): 500 ug via ORAL
  Filled 2018-04-03 (×4): qty 2

## 2018-04-03 MED ORDER — FENTANYL CITRATE (PF) 100 MCG/2ML IJ SOLN
INTRAMUSCULAR | Status: DC | PRN
  Administered 2018-04-03: 25 ug via INTRAVENOUS

## 2018-04-03 MED ORDER — SODIUM CHLORIDE 0.9 % IR SOLN
Status: DC | PRN
  Administered 2018-04-03: 1000 mL

## 2018-04-03 MED ORDER — CEFAZOLIN SODIUM-DEXTROSE 2-4 GM/100ML-% IV SOLN
2.0000 g | Freq: Four times a day (QID) | INTRAVENOUS | Status: AC
  Administered 2018-04-03 (×2): 2 g via INTRAVENOUS
  Filled 2018-04-03 (×2): qty 100

## 2018-04-03 MED ORDER — LOSARTAN POTASSIUM 25 MG PO TABS
25.0000 mg | ORAL_TABLET | Freq: Every day | ORAL | Status: DC
  Administered 2018-04-04 – 2018-04-05 (×2): 25 mg via ORAL
  Filled 2018-04-03 (×3): qty 1

## 2018-04-03 SURGICAL SUPPLY — 44 items
ADH SKN CLS APL DERMABOND .7 (GAUZE/BANDAGES/DRESSINGS) ×1
BAG SPEC THK2 15X12 ZIP CLS (MISCELLANEOUS) ×1
BAG ZIPLOCK 12X15 (MISCELLANEOUS) ×1 IMPLANT
BLADE SAG 18X100X1.27 (BLADE) ×2 IMPLANT
BLADE SURG SZ10 CARB STEEL (BLADE) ×4 IMPLANT
COVER PERINEAL POST (MISCELLANEOUS) ×2 IMPLANT
COVER SURGICAL LIGHT HANDLE (MISCELLANEOUS) ×2 IMPLANT
CUP ACETBLR 54 OD PINNACLE (Hips) ×1 IMPLANT
DERMABOND ADVANCED (GAUZE/BANDAGES/DRESSINGS) ×1
DERMABOND ADVANCED .7 DNX12 (GAUZE/BANDAGES/DRESSINGS) ×1 IMPLANT
DRAPE STERI IOBAN 125X83 (DRAPES) ×2 IMPLANT
DRAPE U-SHAPE 47X51 STRL (DRAPES) ×4 IMPLANT
DRESSING AQUACEL AG SP 3.5X10 (GAUZE/BANDAGES/DRESSINGS) ×1 IMPLANT
DRSG AQUACEL AG SP 3.5X10 (GAUZE/BANDAGES/DRESSINGS) ×2
DURAPREP 26ML APPLICATOR (WOUND CARE) ×2 IMPLANT
ELECT REM PT RETURN 15FT ADLT (MISCELLANEOUS) ×2 IMPLANT
ELIMINATOR HOLE APEX DEPUY (Hips) ×1 IMPLANT
GLOVE BIO SURGEON STRL SZ 6.5 (GLOVE) ×1 IMPLANT
GLOVE BIOGEL PI IND STRL 6.5 (GLOVE) IMPLANT
GLOVE BIOGEL PI IND STRL 7.5 (GLOVE) ×1 IMPLANT
GLOVE BIOGEL PI IND STRL 8.5 (GLOVE) ×1 IMPLANT
GLOVE BIOGEL PI INDICATOR 6.5 (GLOVE) ×1
GLOVE BIOGEL PI INDICATOR 7.5 (GLOVE) ×4
GLOVE BIOGEL PI INDICATOR 8.5 (GLOVE) ×1
GLOVE ECLIPSE 8.0 STRL XLNG CF (GLOVE) ×4 IMPLANT
GLOVE ORTHO TXT STRL SZ7.5 (GLOVE) ×2 IMPLANT
GOWN STRL REUS W/TWL 2XL LVL3 (GOWN DISPOSABLE) ×2 IMPLANT
GOWN STRL REUS W/TWL LRG LVL3 (GOWN DISPOSABLE) ×3 IMPLANT
GOWN STRL REUS W/TWL XL LVL3 (GOWN DISPOSABLE) ×1 IMPLANT
HEAD M SROM 36MM PLUS 1.5 (Hips) IMPLANT
HOLDER FOLEY CATH W/STRAP (MISCELLANEOUS) ×2 IMPLANT
LINER NEUTRAL 54X36MM PLUS 4 (Hips) ×1 IMPLANT
PACK ANTERIOR HIP CUSTOM (KITS) ×2 IMPLANT
SCREW PINN CAN 6.5X20 (Screw) ×1 IMPLANT
SROM M HEAD 36MM PLUS 1.5 (Hips) ×2 IMPLANT
STEM FEMORAL SZ 6MM STD ACTIS (Stem) ×1 IMPLANT
SUT MNCRL AB 4-0 PS2 18 (SUTURE) ×2 IMPLANT
SUT STRATAFIX 0 PDS 27 VIOLET (SUTURE) ×2
SUT VIC AB 1 CT1 36 (SUTURE) ×6 IMPLANT
SUT VIC AB 2-0 CT1 27 (SUTURE) ×4
SUT VIC AB 2-0 CT1 TAPERPNT 27 (SUTURE) ×2 IMPLANT
SUTURE STRATFX 0 PDS 27 VIOLET (SUTURE) ×1 IMPLANT
TRAY FOLEY MTR SLVR 16FR STAT (SET/KITS/TRAYS/PACK) ×1 IMPLANT
YANKAUER SUCT BULB TIP 10FT TU (MISCELLANEOUS) ×1 IMPLANT

## 2018-04-03 NOTE — Progress Notes (Signed)
[place] for warfarin Indication: h/o atrial fibrillation  Allergies  Allergen Reactions  . Naproxen Hives    Patient Measurements:   Vital Signs: Temp: 97.9 F (36.6 C) (01/28 1412) Temp Source: Oral (01/28 0913) BP: 134/91 (01/28 1412) Pulse Rate: 84 (01/28 1412)  Labs: Recent Labs    04/03/18 0909  LABPROT 11.9  INR 0.88    Estimated Creatinine Clearance: 75.4 mL/min (by C-G formula based on SCr of 0.94 mg/dL).   Medical History: Past Medical History:  Diagnosis Date  . Acute rheumatic heart disease, unspecified    childhood, age  55 & 89  . Acute rheumatic pericarditis   . Atrial fibrillation ([place])    history  . Diverticulosis   . Dysrhythmia   . 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 External hemorrhoids without mention of complication   . 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 Lesion of ulnar nerve   . 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 Other and unspecified hyperlipidemia   . Pre-diabetes   . Previous back surgery 1978, jan 2007  . Psychosexual dysfunction with inhibited sexual excitement   . SOB (shortness of breath)    "with heavy exercise"  . Stroke ([place]) 08/2013   "I WAS IN AFIB AND THREW A CLOT, THE EFFECTS WERE TRANSITORY AND DIDNT LAST BUT FOR 30 MINUTES"   . Thoracic aortic aneurysm ([facility])     Medications:  Warfarin maintenance plan:  5 mg (5 mg x 1) every Sun, Tue, Thu; 7.5 mg (5 mg x 1.5) all other days  Weekly warfarin total:  45 mg   Assessment: 78 y/o M on chronic warfarin for atrial fibrillation now s/p THA to resume warfarin. Patient was instructed by warfarin clinic to take last dose on 1/22 prior to surgery and bridge with Lovenox 1.5 mg/kg daily. Of note, INR was supra-therapeutic 1/15 at 3.7.   CHA2DS2/VAS=6   Goal of Therapy:  INR 2-3 Monitor platelets by anticoagulation protocol: Yes   Plan:   Will order warfarin 7.5 mg once tonight.   Lovenox 40 mg daily ordered to  start 1/29  Daily INR  Monitor for bleeding/thrombosis   Ulice Dash D 04/03/2018,2:43 PM

## 2018-04-03 NOTE — Transfer of Care (Signed)
Immediate Anesthesia Transfer of Care Note  Patient: [name] [name]  Procedure(s) Performed: Procedure(s) with comments: TOTAL HIP ARTHROPLASTY ANTERIOR APPROACH (Right) - 70 mins  Patient Location: PACU  Anesthesia Type:Spinal  Level of Consciousness:  sedated, patient cooperative and responds to stimulation  Airway & Oxygen Therapy:Patient Spontanous Breathing and Patient connected to face mask oxgen  Post-op Assessment:  Report given to PACU RN and Post -op Vital signs reviewed and stable  Post vital signs:  Reviewed and stable  Last Vitals:  Vitals:   04/03/18 0913  BP: 112/80  Pulse: 84  Temp: 36.5 C  SpO2: 32%    Complications: No apparent anesthesia complications

## 2018-04-03 NOTE — Op Note (Signed)
NAME:  [name] [name]                ACCOUNT NO.: [id number]      MEDICAL RECORD NO.: [id number]      FACILITY:  [facility]      PHYSICIAN:  Mauri Pole  DATE OF BIRTH:  [birth year]     DATE OF PROCEDURE:  04/03/2018                                 OPERATIVE REPORT         PREOPERATIVE DIAGNOSIS: Right  hip osteoarthritis.      POSTOPERATIVE DIAGNOSIS:  Right hip osteoarthritis.      PROCEDURE:  Right total hip replacement through an anterior approach   utilizing DePuy THR system, component size 54 mm pinnacle cup, a size 36+4 neutral   Altrex liner, a size 6 standard Actis stem with a 36+1.5 Articuleze metal head ball.      SURGEON:  Pietro Cassis. Alvan Dame, M.D.      ASSISTANT:  Danae Orleans, PA-C     ANESTHESIA:  Spinal.      SPECIMENS:  None.      COMPLICATIONS:  None.      BLOOD LOSS:  600 cc     DRAINS:  None.      INDICATION OF THE PROCEDURE:  [name] [name] is a 78 y.o. male who had   presented to office for evaluation of right hip pain.  Radiographs revealed   progressive degenerative changes with bone-on-bone   articulation of the  hip joint, including subchondral cystic changes and osteophytes.  The patient had painful limited range of   motion significantly affecting their overall quality of life and function.  The patient was failing to    respond to conservative measures including medications and/or injections and activity modification and at this point was ready   to proceed with more definitive measures.  Consent was obtained for   benefit of pain relief.  Specific risks of infection, DVT, component   failure, dislocation, neurovascular injury, and need for revision surgery were reviewed in the office as well discussion of   the anterior versus posterior approach were reviewed.     PROCEDURE IN DETAIL:  The patient was brought to operative theater.   Once adequate anesthesia, preoperative antibiotics, 2 gm of Ancef, 1 gm of Tranexamic Acid,  and 10 mg of Decadron were administered, the patient was positioned supine on the [employer] table.  Once the patient was safely positioned with adequate padding of boney prominences we predraped out the hip, and used fluoroscopy to confirm orientation of the pelvis.      The right hip was then prepped and draped from proximal iliac crest to   mid thigh with a shower curtain technique.      Time-out was performed identifying the patient, planned procedure, and the appropriate extremity.     An incision was then made 2 cm lateral to the   anterior superior iliac spine extending over the orientation of the   tensor fascia lata muscle and sharp dissection was carried down to the   fascia of the muscle.      The fascia was then incised.  The muscle belly was identified and swept   laterally and retractor placed along the superior neck.  Following   cauterization of the circumflex vessels and removing some pericapsular  fat, a second cobra retractor was placed on the inferior neck.  A T-capsulotomy was made along the line of the   superior neck to the trochanteric fossa, then extended proximally and   distally.  Tag sutures were placed and the retractors were then placed   intracapsular.  We then identified the trochanteric fossa and   orientation of my neck cut and then made a neck osteotomy with the femur on traction.  The femoral   head was removed without difficulty or complication.  Traction was let   off and retractors were placed posterior and anterior around the   acetabulum.      The labrum and foveal tissue were debrided.  I began reaming with a 48 mm   reamer and reamed up to 53 mm reamer with good bony bed preparation and a 54 mm  cup was chosen.  Two large cysts within the superior acetabulum were debrided and packed with autograft bone from his femoral head.  The final 54 mm Pinnacle cup was then impacted under fluoroscopy to confirm the depth of penetration and orientation with  respect to   Abduction and forward flexion.  A screw was placed into the ilium followed by the hole eliminator.  The final   36+4 neutral Altrex liner was impacted with good visualized rim fit.  The cup was positioned anatomically within the acetabular portion of the pelvis.      At this point, the femur was rolled to 100 degrees.  Further capsule was   released off the inferior aspect of the femoral neck.  I then   released the superior capsule proximally.  With the leg in a neutral position the hook was placed laterally   along the femur under the vastus lateralis origin and elevated manually and then held in position using the hook attachment on the bed.  The leg was then extended and adducted with the leg rolled to 100   degrees of external rotation.  Retractors were placed along the medial calcar and posteriorly over the greater trochanter.  Once the proximal femur was fully   exposed, I used a box osteotome to set orientation.  I then began   broaching with the starting chili pepper broach and passed this by hand and then broached up to 6.  With the 6 broach in place I chose a standard offset neck and did several trial reductions.  The offset was appropriate, leg lengths   appeared to be equal best matched with the +1.5 head ball trial confirmed radiographically.   Given these findings, I went ahead and dislocated the hip, repositioned all   retractors and positioned the right hip in the extended and abducted position.  The final 6 standard Atcis stem was   chosen and it was impacted down to the level of neck cut.  Based on this   and the trial reductions, a final 36+1.5 Articuleze metal head ball was chosen and   impacted onto a clean and dry trunnion, and the hip was reduced.  The   hip had been irrigated throughout the case again at this point.  I did   reapproximate the superior capsular leaflet to the anterior leaflet   using #1 Vicryl.  The fascia of the   tensor fascia lata muscle  was then reapproximated using #1 Vicryl and #0 Stratafix sutures.  The   remaining wound was closed with 2-0 Vicryl and running 4-0 Monocryl.   The hip was cleaned, dried, and dressed  sterilely using Dermabond and   Aquacel dressing.  The patient was then brought   to recovery room in stable condition tolerating the procedure well.    Danae Orleans, PA-C was present for the entirety of the case involved from   preoperative positioning, perioperative retractor management, general   facilitation of the case, as well as primary wound closure as assistant.            Pietro Cassis Alvan Dame, M.D.        04/03/2018 12:35 PM

## 2018-04-03 NOTE — Evaluation (Signed)
Physical Therapy Evaluation Patient Details Name: [name] [name] MRN: [id number] DOB: 08/31/1940 Today's Date: 04/03/2018   History of Present Illness  78 yo male s/p R DA-THA on 04/03/18. PMH includes LBP, mitral and aortic valve replacement, afib and aflutter on chronic anticoag, pre-DM, L frontal CVA 2015, ShOB, cardioversion multiple times.  Clinical Impression   Pt presents with R hip pain, post-operative R hip weakness, difficulty performing bed mobility, and decreased tolerance for ambulation due to R hip pain. Pt to benefit from acute PT to address deficits. Pt ambulated 50 ft with RW with min guard assist, verbal cuing provided for form and safety. Pt educated on ankle pumps (20/hour) to perform this afternoon/evening to increase circulation, to pt's tolerance and limited by pain. PT to progress mobility as tolerated, and will continue to follow acutely.        Follow Up Recommendations Follow surgeon's recommendation for DC plan and follow-up therapies;Supervision for mobility/OOB    Equipment Recommendations  None recommended by PT    Recommendations for Other Services       Precautions / Restrictions Precautions Precautions: Fall Restrictions Weight Bearing Restrictions: No Other Position/Activity Restrictions: WBAT       Mobility  Bed Mobility Overal bed mobility: Needs Assistance Bed Mobility: Supine to Sit     Supine to sit: Min assist;HOB elevated     General bed mobility comments: Min assist for RLE lifting and translation to EOB. Increased time to perform. Posterior leaning with forward scooting.   Transfers Overall transfer level: Needs assistance Equipment used: Rolling walker (2 wheeled) Transfers: Sit to/from Stand Sit to Stand: Min guard;From elevated surface         General transfer comment: Min guard for safety, Verbal cuing for hand placement (one hand on bed, one hand on RW)   Ambulation/Gait Ambulation/Gait assistance: Min guard Gait  Distance (Feet): 50 Feet Assistive device: Rolling walker (2 wheeled) Gait Pattern/deviations: Step-to pattern;Decreased stance time - right;Decreased weight shift to right;Antalgic;Trunk flexed Gait velocity: decr    General Gait Details: Min guard for safety. Verbal cuing for placement in RW, turning, sequencing with step-to gait and RLE first.  Stairs            Wheelchair Mobility    Modified Rankin (Stroke Patients Only)       Balance Overall balance assessment: Mild deficits observed, not formally tested                                           Pertinent Vitals/Pain Pain Assessment: 0-10 Pain Score: 3  Pain Location: R hip  Pain Descriptors / Indicators: Sore Pain Intervention(s): Limited activity within patient's tolerance;Repositioned;Ice applied;Monitored during session;Premedicated before session    Home Living Family/patient expects to be discharged to:: Private residence Living Arrangements: Spouse/significant other Available Help at Discharge: Family;Available 24 hours/day Type of Home: House Home Access: Stairs to enter   [employer] of Steps: 0 Home Layout: One level Home Equipment: Bedside commode;Walker - 2 wheels;Cane - single point      Prior Function Level of Independence: Independent with assistive device(s)         Comments: using cane PTA      Hand Dominance   Dominant Hand: Right    Extremity/Trunk Assessment   Upper Extremity Assessment Upper Extremity Assessment: Overall WFL for tasks assessed    Lower Extremity Assessment Lower Extremity Assessment: Overall  WFL for tasks assessed;RLE deficits/detail RLE Deficits / Details: suspected post-surgical hip weakness; able to perform ankle pumps, quad set, heel slide RLE Sensation: WNL    Cervical / Trunk Assessment Cervical / Trunk Assessment: Normal  Communication   Communication: No difficulties  Cognition Arousal/Alertness:  Awake/alert Behavior During Therapy: WFL for tasks assessed/performed Overall Cognitive Status: Within Functional Limits for tasks assessed                                        General Comments      Exercises     Assessment/Plan    PT Assessment Patient needs continued PT services  PT Problem List Decreased strength;Pain;Decreased activity tolerance;Decreased knowledge of use of DME;Decreased balance;Decreased mobility       PT Treatment Interventions DME instruction;Therapeutic activities;Gait training;Therapeutic exercise;Patient/family education;Stair training;Balance training;Functional mobility training    PT Goals (Current goals can be found in the Care Plan section)  Acute Rehab PT Goals Patient Stated Goal: none stated  PT Goal Formulation: With patient Time For Goal Achievement: 04/10/18 Potential to Achieve Goals: Good    Frequency 7X/week   Barriers to discharge        Co-evaluation               AM-PAC PT "6 Clicks" Mobility  Outcome Measure Help needed turning from your back to your side while in a flat bed without using bedrails?: A Little Help needed moving from lying on your back to sitting on the side of a flat bed without using bedrails?: A Little Help needed moving to and from a bed to a chair (including a wheelchair)?: A Little Help needed standing up from a chair using your arms (e.g., wheelchair or bedside chair)?: A Little Help needed to walk in hospital room?: A Little Help needed climbing 3-5 steps with a railing? : A Little 6 Click Score: 18    End of Session Equipment Utilized During Treatment: Gait belt Activity Tolerance: Patient tolerated treatment well;No increased pain Patient left: in chair;with chair alarm set;with call bell/phone within reach;with family/visitor present;with SCD's reapplied Nurse Communication: Mobility status PT Visit Diagnosis: Other abnormalities of gait and mobility (R26.89);Difficulty  in walking, not elsewhere classified (R26.2)    Time: 1610-9604 PT Time Calculation (min) (ACUTE ONLY): 18 min   Charges:   PT Evaluation $PT Eval Low Complexity: 1 Low         Julien Girt, PT Acute Rehabilitation Services Pager [phone]  Office [phone]   Roxine Caddy D Elonda Husky 04/03/2018, 7:28 PM

## 2018-04-03 NOTE — Anesthesia Procedure Notes (Addendum)
Spinal  Start time: 04/03/2018 10:59 AM End time: 04/03/2018 11:03 AM Staffing Anesthesiologist: Roderic Palau, MD Resident/CRNA: Adalberto Ill, CRNA Performed: resident/CRNA  Preanesthetic Checklist Completed: patient identified, site marked, surgical consent, pre-op evaluation, timeout performed, IV checked, risks and benefits discussed and monitors and equipment checked Spinal Block Patient position: sitting Prep: ChloraPrep, site prepped and draped, DuraPrep and alcohol swabs Patient monitoring: heart rate, cardiac monitor, continuous pulse ox and blood pressure Approach: midline Location: L4-5 Injection technique: single-shot Needle Needle type: Pencan  Needle gauge: 24 G Needle length: 10 cm Needle insertion depth: 8 cm Assessment Sensory level: T10 Additional Notes Risks benefits alternatives and plan discussed with patient by MD and CRNA all questions answered verbalizes consent and desires plan, to OR on stretcher and positioned as appropriate per protocol for a SAB, sterile prep by Dr. Ola Spurr allowed to dry in 3 minutes, sterile gloves and FM worn throughout and sterile drape, MD Ola Spurr present throughout placement of spinal, lidocaine 1% MPF to L4-5, pencan 24G advanced via introducer needle one attempt brief, positive CSF 0.75% mpf spinal bupivicaine intrathecal 1.6 mLor 12.5 mg placed easily barbotage at beginning and end, after block immediately placed on back patient verbalizes legs warm and motor loss of lower ext progressive, sedation initiated as patient moved from stretcher over to OR bed maintaining head midline neutral padded and supported, no paresthesias noted during the placement of the block

## 2018-04-03 NOTE — Care Plan (Signed)
Ortho Bundle Case Management Note  Patient Details  Name: CAMAURI CRATON MRN: [id number] Date of Birth: 1940-06-05  R THA on 04-03-2018 DCP:  Home with spouse.  2 story home with 0 ste. DME:  No needs.  Has a RW and 3-in-1. PT:  HEP                   DME Arranged:  N/A DME Agency:  NA  HH Arranged:  NA HH Agency:  NA  Additional Comments: Please contact me with any questions of if this plan should need to change.  Marianne Sofia, RN,CCM [facility]  [phone] 04/03/2018, 11:43 AM

## 2018-04-03 NOTE — Plan of Care (Signed)

## 2018-04-03 NOTE — Discharge Instructions (Signed)

## 2018-04-03 NOTE — Interval H&P Note (Signed)
History and Physical Interval Note:  04/03/2018 9:29 AM  [name] [name]  has presented today for surgery, with the diagnosis of Right hip osteoarthritis  The various methods of treatment have been discussed with the patient and family. After consideration of risks, benefits and other options for treatment, the patient has consented to  Procedure(s) with comments: TOTAL HIP ARTHROPLASTY ANTERIOR APPROACH (Right) - 70 mins as a surgical intervention .  The patient's history has been reviewed, patient examined, no change in status, stable for surgery.  I have reviewed the patient's chart and labs.  Questions were answered to the patient's satisfaction.     Mauri Pole

## 2018-04-03 NOTE — Anesthesia Postprocedure Evaluation (Signed)
Anesthesia Post Note  Patient: [name] [name]  Procedure(s) Performed: TOTAL HIP ARTHROPLASTY ANTERIOR APPROACH (Right Hip)     Patient location during evaluation: PACU Anesthesia Type: MAC and Spinal Level of consciousness: oriented and awake and alert Pain management: pain level controlled Vital Signs Assessment: post-procedure vital signs reviewed and stable Respiratory status: spontaneous breathing, respiratory function stable and patient connected to nasal cannula oxygen Cardiovascular status: blood pressure returned to baseline and stable Postop Assessment: no headache, no backache, no apparent nausea or vomiting and spinal receding Anesthetic complications: no    Last Vitals:  Vitals:   04/03/18 1345 04/03/18 1357  BP: 124/85   Pulse: 89 78  Resp: 12 12  Temp: 36.7 C 37 C  SpO2: 99% 98%    Last Pain:  Vitals:   04/03/18 1357  TempSrc:   PainSc: Asleep    LLE Motor Response: Purposeful movement (04/03/18 1357) LLE Sensation: Decreased (04/03/18 1357) RLE Motor Response: Purposeful movement (04/03/18 1357) RLE Sensation: Decreased (04/03/18 1357) L Sensory Level: L5-Outer lower leg, top of foot, great toe (04/03/18 1357) R Sensory Level: S1-Sole of foot, small toes (04/03/18 1357)  Jerald Villalona,W. EDMOND

## 2018-04-04 ENCOUNTER — Encounter (HOSPITAL_COMMUNITY): Payer: Self-pay | Admitting: Orthopedic Surgery

## 2018-04-04 DIAGNOSIS — E669 Obesity, unspecified: Secondary | ICD-10-CM | POA: Diagnosis not present

## 2018-04-04 DIAGNOSIS — I4891 Unspecified atrial fibrillation: Secondary | ICD-10-CM | POA: Diagnosis not present

## 2018-04-04 DIAGNOSIS — E785 Hyperlipidemia, unspecified: Secondary | ICD-10-CM | POA: Diagnosis not present

## 2018-04-04 DIAGNOSIS — I10 Essential (primary) hypertension: Secondary | ICD-10-CM | POA: Diagnosis not present

## 2018-04-04 DIAGNOSIS — R7303 Prediabetes: Secondary | ICD-10-CM | POA: Diagnosis not present

## 2018-04-04 DIAGNOSIS — M1611 Unilateral primary osteoarthritis, right hip: Secondary | ICD-10-CM | POA: Diagnosis not present

## 2018-04-04 LAB — CBC
HCT: 35.7 % — ABNORMAL LOW (ref 39.0–52.0)
HEMOGLOBIN: 11.4 g/dL — AB (ref 13.0–17.0)
MCH: 30.8 pg (ref 26.0–34.0)
MCHC: 31.9 g/dL (ref 30.0–36.0)
MCV: 96.5 fL (ref 80.0–100.0)
Platelets: 192 10*3/uL (ref 150–400)
RBC: 3.7 MIL/uL — ABNORMAL LOW (ref 4.22–5.81)
RDW: 13 % (ref 11.5–15.5)
WBC: 15.9 10*3/uL — ABNORMAL HIGH (ref 4.0–10.5)
nRBC: 0 % (ref 0.0–0.2)

## 2018-04-04 LAB — PROTIME-INR
INR: 0.98
Prothrombin Time: 12.8 seconds (ref 11.4–15.2)

## 2018-04-04 LAB — BASIC METABOLIC PANEL
Anion gap: 6 (ref 5–15)
BUN: 21 mg/dL (ref 8–23)
CO2: 23 mmol/L (ref 22–32)
Calcium: 8.5 mg/dL — ABNORMAL LOW (ref 8.9–10.3)
Chloride: 106 mmol/L (ref 98–111)
Creatinine, Ser: 0.97 mg/dL (ref 0.61–1.24)
GFR calc Af Amer: >60 mL/min (ref >60)
GFR calc non Af Amer: >60 mL/min (ref >60)
Glucose, Bld: 172 mg/dL — ABNORMAL HIGH (ref 70–99)
Potassium: 4.4 mmol/L (ref 3.5–5.1)
Sodium: 135 mmol/L (ref 135–145)

## 2018-04-04 MED ORDER — DOCUSATE SODIUM 100 MG PO CAPS: 100.0000 mg | ORAL_CAPSULE | Freq: Two times a day (BID) | ORAL | 0 refills | Status: DC

## 2018-04-04 MED ORDER — WARFARIN SODIUM 5 MG PO TABS
7.5000 mg | ORAL_TABLET | Freq: Once | ORAL | Status: AC
  Administered 2018-04-04: 7.5 mg via ORAL
  Filled 2018-04-04: qty 1

## 2018-04-04 MED ORDER — METHOCARBAMOL 500 MG PO TABS: 500.0000 mg | ORAL_TABLET | Freq: Four times a day (QID) | ORAL | 0 refills | Status: DC | PRN

## 2018-04-04 MED ORDER — POLYETHYLENE GLYCOL 3350 17 G PO PACK: 17.0000 g | PACK | Freq: Two times a day (BID) | ORAL | 0 refills | Status: DC

## 2018-04-04 MED ORDER — HYDROCODONE-ACETAMINOPHEN 7.5-325 MG PO TABS: 1.0000 | ORAL_TABLET | ORAL | 0 refills | Status: DC | PRN

## 2018-04-04 MED ORDER — FERROUS SULFATE 325 (65 FE) MG PO TABS: 325.0000 mg | ORAL_TABLET | Freq: Three times a day (TID) | ORAL | 3 refills | Status: DC

## 2018-04-04 NOTE — Progress Notes (Signed)
[place] for warfarin Indication: h/o atrial fibrillation  Allergies  Allergen Reactions  . Naproxen Hives    Patient Measurements: Height: 5\' 8"  (172.7 cm) Weight: 205 lb 11 oz (93.3 kg) IBW/kg (Calculated) : 68.4 Vital Signs: Temp: 98 F (36.7 C) (01/29 1036) Temp Source: Oral (01/29 1036) BP: 103/71 (01/29 1036) Pulse Rate: 84 (01/29 1036)  Labs: Recent Labs    04/03/18 0909 04/03/18 1500 04/04/18 0522  HGB  --  12.8* 11.4*  HCT  --  40.0 35.7*  PLT  --  204 192  LABPROT 11.9  --  12.8  INR 0.88  --  0.98  CREATININE  --  1.02 0.97    Estimated Creatinine Clearance: 73 mL/min (by C-G formula based on SCr of 0.97 mg/dL).   Medical History: Past Medical History:  Diagnosis Date  . Acute rheumatic heart disease, unspecified    childhood, age  16 & 19  . Acute rheumatic pericarditis   . Atrial fibrillation ([place])    history  . Diverticulosis   . Dysrhythmia   . 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 External hemorrhoids without mention of complication   . 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 Lesion of ulnar nerve   . 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 Other and unspecified hyperlipidemia   . Pre-diabetes   . Previous back surgery 1978, jan 2007  . Psychosexual dysfunction with inhibited sexual excitement   . SOB (shortness of breath)    "with heavy exercise"  . Stroke ([place]) 08/2013   "I WAS IN AFIB AND THREW A CLOT, THE EFFECTS WERE TRANSITORY AND DIDNT LAST BUT FOR 30 MINUTES"   . Thoracic aortic aneurysm ([facility])     Medications:  Warfarin maintenance plan:  5 mg (5 mg x 1) every Sun, Tue, Thu; 7.5 mg (5 mg x 1.5) all other days  Weekly warfarin total:  45 mg   Assessment: 78 y/o M on chronic warfarin for atrial fibrillation now s/p THA to resume warfarin. Patient was instructed by warfarin clinic to take last dose on 1/22 prior to surgery and bridge with Lovenox 1.5 mg/kg daily. Of note, INR was  supra-therapeutic 1/15 at 3.7.   CHA2DS2/VAS=6  Today 04/04/18 12:22 PM   POD#1  INR 0.88 > 0.98 after initial dose    No bleeding reported  CBC ok   Goal of Therapy:  INR 2-3 Monitor platelets by anticoagulation protocol: Yes   Plan:   Will order warfarin 7.5 mg once tonight.   Lovenox 40 mg daily ordered to start 1/29  Daily INR  Monitor for bleeding/thrombosis   Napoleon Form 04/04/2018,12:22 PM

## 2018-04-04 NOTE — Progress Notes (Signed)
     Subjective: 1 Day Post-Op Procedure(s) (LRB): TOTAL HIP ARTHROPLASTY ANTERIOR APPROACH (Right)   Patient reports pain as mild, pain controlled.  No events throughout the night. He has had issue after previous surgeries with his heart.  Plan on keeping him here to make sure he has no incidents throughout the day.  Plan for discharge tomorrow due to underlying medical co-morbidities, pain control and need for inpatient therapy to meet goal of being discharged home safely with family/caregiver.  Past Medical History:  Diagnosis Date  . Acute rheumatic heart disease, unspecified    childhood, age  41 & 89  . Acute rheumatic pericarditis   . Atrial fibrillation ([place])    history  . Diverticulosis   . Dysrhythmia   . 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 External hemorrhoids without mention of complication   . 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 Lesion of ulnar nerve   . 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 Other and unspecified hyperlipidemia   . Pre-diabetes   . Previous back surgery 1978, jan 2007  . Psychosexual dysfunction with inhibited sexual excitement   . SOB (shortness of breath)    "with heavy exercise"  . Stroke ([place]) 08/2013   "I WAS IN AFIB AND THREW A CLOT, THE EFFECTS WERE TRANSITORY AND DIDNT LAST BUT FOR 30 MINUTES"   . Thoracic aortic aneurysm ([facility])         Objective:   VITALS:   Vitals:   04/04/18 0149 04/04/18 0555  BP: 108/80 117/78  Pulse: 93 84  Resp: 16 14  Temp: 98 F (36.7 C) (!) 97.5 F (36.4 C)  SpO2: 98% 99%    Dorsiflexion/Plantar flexion intact Incision: dressing C/D/I No cellulitis present Compartment soft  LABS Recent Labs    04/03/18 1500 04/04/18 0522  HGB 12.8* 11.4*  HCT 40.0 35.7*  WBC 15.9* 15.9*  PLT 204 192    Recent Labs    04/03/18 1500 04/04/18 0522  NA  --  135  K  --  4.4  BUN  --  21  CREATININE 1.02 0.97  GLUCOSE  --  172*     Assessment/Plan: 1 Day Post-Op Procedure(s)  (LRB): TOTAL HIP ARTHROPLASTY ANTERIOR APPROACH (Right) Foley cath d/c'ed Advance diet Up with therapy D/C IV fluids Discharge home when ready, probably tomorrow  Obese (BMI 30-39.9) Estimated body mass index is 31.27 kg/m as calculated from the following:   Height as of this encounter: 5\' 8"  (1.727 m).   Weight as of this encounter: 93.3 kg. Patient also counseled that weight may inhibit the healing process Patient counseled that losing weight will help with future health issues      [place] Pugh. [employer]   PAC  04/04/2018, 8:53 AM

## 2018-04-04 NOTE — Progress Notes (Addendum)
Physical Therapy Treatment Patient Details Name: [name] [name] MRN: [id number] DOB: March 28, 1940 Today's Date: 04/04/2018    History of Present Illness 78 yo male s/p R DA-THA on 04/03/18. PMH includes LBP, mitral and aortic valve replacement, afib and aflutter on chronic anticoag, pre-DM, L frontal CVA 2015, ShOB, cardioversion multiple times.    PT Comments    The patient is mobilizing well. Plans DC tomorrow. Continue  PT for mobility   Follow Up Recommendations  Follow surgeon's recommendation for DC plan and follow-up therapies;Supervision for mobility/OOB     Equipment Recommendations  None recommended by PT    Recommendations for Other Services       Precautions / Restrictions Precautions Precautions: Fall Precaution Comments: has atrial  flutter    Mobility  Bed Mobility   Bed Mobility: Supine to Sit     Supine to sit: Supervision     General bed mobility comments: patient  self assisted  to sitting. HOB raised.  Transfers Overall transfer level: Needs assistance Equipment used: Rolling walker (2 wheeled) Transfers: Sit to/from Stand Sit to Stand: Min guard;From elevated surface         General transfer comment: not tested, to eat.  Ambulation/Gait Ambulation/Gait assistance: Min guard Gait Distance (Feet): 200 Feet Assistive device: Rolling walker (2 wheeled) Gait Pattern/deviations: Step-through pattern     General Gait Details: gait is smoothe, not antalgic   Stairs--             Wheelchair Mobility    Modified Rankin (Stroke Patients Only)       Balance                                            Cognition Arousal/Alertness: Awake/alert                                            Exercises Total Joint Exercises Ankle Circles/Pumps: AROM;10 reps Short Arc Quad: AROM;Right;10 reps Heel Slides: AAROM;Right;10 reps Hip ABduction/ADduction: AAROM;Right;10 reps Straight Leg Raises:  AROM;Right;10 reps    General Comments        Pertinent Vitals/Pain Pain Score: 2  Pain Location: R hip  Pain Descriptors / Indicators: Sore Pain Intervention(s): Premedicated before session    Home Living                      Prior Function            PT Goals (current goals can now be found in the care plan section) Progress towards PT goals: Progressing toward goals    Frequency    7X/week      PT Plan Current plan remains appropriate    Co-evaluation              AM-PAC PT "6 Clicks" Mobility   Outcome Measure  Help needed turning from your back to your side while in a flat bed without using bedrails?: A Little Help needed moving from lying on your back to sitting on the side of a flat bed without using bedrails?: A Little Help needed moving to and from a bed to a chair (including a wheelchair)?: A Little Help needed standing up from a chair using your arms (e.g., wheelchair or bedside chair)?: A Little Help  needed to walk in hospital room?: A Little Help needed climbing 3-5 steps with a railing? : A Lot 6 Click Score: 17    End of Session   Activity Tolerance: Patient tolerated treatment well;No increased pain Patient left: in chair;with call bell/phone within reach;with family/visitor present Nurse Communication: Mobility status PT Visit Diagnosis: Unsteadiness on feet (R26.81);Pain Pain - Right/Left: Right     Time: 4159-0172 PT Time Calculation (min) (ACUTE ONLY): 16 min  Charges:  $Gait Training: 8-22 mins                    [place] Pager [phone] Office [phone]    Claretha Cooper 04/04/2018, 1:19 PM

## 2018-04-04 NOTE — Progress Notes (Signed)
Physical Therapy Treatment Patient Details Name: [name] [name] MRN: [id number] DOB: 07-Aug-1940 Today's Date: 04/04/2018    History of Present Illness 78 yo male s/p R DA-THA on 04/03/18. PMH includes LBP, mitral and aortic valve replacement, afib and aflutter on chronic anticoag, pre-DM, L frontal CVA 2015, ShOB, cardioversion multiple times.    PT Comments    Patient progressing well. Plans Dc tomorrow.   Follow Up Recommendations  Follow surgeon's recommendation for DC plan and follow-up therapies;Supervision for mobility/OOB     Equipment Recommendations  None recommended by PT    Recommendations for Other Services       Precautions / Restrictions Precautions Precautions: Fall Precaution Comments: has atrial  flutter    Mobility  Bed Mobility               General bed mobility comments: oob  Transfers Overall transfer level: Needs assistance Equipment used: Rolling walker (2 wheeled) Transfers: Sit to/from Stand Sit to Stand: From elevated surface;Supervision            Ambulation/Gait Ambulation/Gait assistance: Supervision Gait Distance (Feet): 300 Feet Assistive device: Rolling walker (2 wheeled) Gait Pattern/deviations: Step-through pattern     General Gait Details: gait is smoothe, not antalgic   Stairs Stairs: Yes Stairs assistance: Min assist Stair Management: Step to pattern;Forwards;With cane Number of Stairs: 5 General stair comments: practiced with 2 canes and wal and a cane simulation   Wheelchair Mobility    Modified Rankin (Stroke Patients Only)       Balance                                            Cognition Arousal/Alertness: Awake/alert                                            Exercises      General Comments        Pertinent Vitals/Pain Pain Score: 1  Pain Descriptors / Indicators: Sore Pain Intervention(s): Monitored during session;Ice applied    Home Living                       Prior Function            PT Goals (current goals can now be found in the care plan section) Progress towards PT goals: Progressing toward goals    Frequency    7X/week      PT Plan Current plan remains appropriate    Co-evaluation              AM-PAC PT "6 Clicks" Mobility   Outcome Measure  Help needed turning from your back to your side while in a flat bed without using bedrails?: A Little Help needed moving from lying on your back to sitting on the side of a flat bed without using bedrails?: A Little Help needed moving to and from a bed to a chair (including a wheelchair)?: A Little Help needed standing up from a chair using your arms (e.g., wheelchair or bedside chair)?: A Little Help needed to walk in hospital room?: A Little Help needed climbing 3-5 steps with a railing? : A Lot 6 Click Score: 17    End of Session   Activity Tolerance: Patient tolerated  treatment well;No increased pain Patient left: in chair;with call bell/phone within reach;with family/visitor present Nurse Communication: Mobility status PT Visit Diagnosis: Unsteadiness on feet (R26.81);Pain     Time: 1557-1620 PT Time Calculation (min) (ACUTE ONLY): 23 min  Charges:  $Gait Training: 23-37 mins                     [place] Pager [phone] Office [phone]    Claretha Cooper 04/04/2018, 5:33 PM

## 2018-04-04 NOTE — Plan of Care (Signed)
  Problem: Education: Goal: Knowledge of General Education information will improve Description: Including pain rating scale, medication(s)/side effects and non-pharmacologic comfort measures Outcome: Progressing   Problem: Clinical Measurements: Goal: Respiratory complications will improve Outcome: Progressing   Problem: Nutrition: Goal: Adequate nutrition will be maintained Outcome: Progressing   Problem: Coping: Goal: Level of anxiety will decrease Outcome: Progressing   Problem: Pain Managment: Goal: General experience of comfort will improve Outcome: Progressing   

## 2018-04-04 NOTE — Progress Notes (Signed)
Physical Therapy Treatment Patient Details Name: [name] [name] MRN: [id number] DOB: 08-Mar-1940 Today's Date: 04/04/2018    History of Present Illness 78 yo male s/p R DA-THA on 04/03/18. PMH includes LBP, mitral and aortic valve replacement, afib and aflutter on chronic anticoag, pre-DM, L frontal CVA 2015, ShOB, cardioversion multiple times.    PT Comments    Patient assisted self to sitting on bed edge to eat. Patient will stay another night due to cardiac history. Continue PT.  Follow Up Recommendations  Follow surgeon's recommendation for DC plan and follow-up therapies;Supervision for mobility/OOB     Equipment Recommendations  None recommended by PT    Recommendations for Other Services       Precautions / Restrictions Precautions Precautions: Fall Precaution Comments: has a flutter    Mobility  Bed Mobility   Bed Mobility: Supine to Sit     Supine to sit: Min guard;HOB elevated     General bed mobility comments: patient  self assisted  to sitting. HOB raised.  Transfers                 General transfer comment: not tested, to eat.  Ambulation/Gait                 Stairs             Wheelchair Mobility    Modified Rankin (Stroke Patients Only)       Balance                                            Cognition Arousal/Alertness: Awake/alert                                            Exercises      General Comments        Pertinent Vitals/Pain Pain Score: 2  Pain Location: R hip  Pain Descriptors / Indicators: Sore Pain Intervention(s): Patient requesting pain meds-RN notified;Ice applied    Home Living                      Prior Function            PT Goals (current goals can now be found in the care plan section) Progress towards PT goals: Progressing toward goals    Frequency    7X/week      PT Plan Current plan remains appropriate    Co-evaluation               AM-PAC PT "6 Clicks" Mobility   Outcome Measure  Help needed turning from your back to your side while in a flat bed without using bedrails?: A Little Help needed moving from lying on your back to sitting on the side of a flat bed without using bedrails?: A Little Help needed moving to and from a bed to a chair (including a wheelchair)?: A Little Help needed standing up from a chair using your arms (e.g., wheelchair or bedside chair)?: A Little Help needed to walk in hospital room?: A Little Help needed climbing 3-5 steps with a railing? : A Lot 6 Click Score: 17    End of Session   Activity Tolerance: Patient tolerated treatment well;No increased pain Patient left: in bed;with  call bell/phone within reach Nurse Communication: Mobility status PT Visit Diagnosis: Unsteadiness on feet (R26.81);Pain     Time: 7829-5621 PT Time Calculation (min) (ACUTE ONLY): 10 min  Charges:  $Self Care/Home Management: [place] Pager [phone] Office [phone]    Claretha Cooper 04/04/2018, 9:37 AM

## 2018-04-05 ENCOUNTER — Telehealth: Payer: Self-pay | Admitting: *Deleted

## 2018-04-05 DIAGNOSIS — M1611 Unilateral primary osteoarthritis, right hip: Secondary | ICD-10-CM | POA: Diagnosis not present

## 2018-04-05 DIAGNOSIS — E785 Hyperlipidemia, unspecified: Secondary | ICD-10-CM | POA: Diagnosis not present

## 2018-04-05 DIAGNOSIS — E669 Obesity, unspecified: Secondary | ICD-10-CM | POA: Diagnosis not present

## 2018-04-05 DIAGNOSIS — I4891 Unspecified atrial fibrillation: Secondary | ICD-10-CM | POA: Diagnosis not present

## 2018-04-05 DIAGNOSIS — R7303 Prediabetes: Secondary | ICD-10-CM | POA: Diagnosis not present

## 2018-04-05 DIAGNOSIS — I10 Essential (primary) hypertension: Secondary | ICD-10-CM | POA: Diagnosis not present

## 2018-04-05 LAB — CBC
HCT: 34.2 % — ABNORMAL LOW (ref 39.0–52.0)
HEMOGLOBIN: 10.9 g/dL — AB (ref 13.0–17.0)
MCH: 31.1 pg (ref 26.0–34.0)
MCHC: 31.9 g/dL (ref 30.0–36.0)
MCV: 97.7 fL (ref 80.0–100.0)
Platelets: 183 10*3/uL (ref 150–400)
RBC: 3.5 MIL/uL — ABNORMAL LOW (ref 4.22–5.81)
RDW: 13.2 % (ref 11.5–15.5)
WBC: 20.5 10*3/uL — ABNORMAL HIGH (ref 4.0–10.5)
nRBC: 0 % (ref 0.0–0.2)

## 2018-04-05 LAB — BASIC METABOLIC PANEL
ANION GAP: 11 (ref 5–15)
BUN: 24 mg/dL — ABNORMAL HIGH (ref 8–23)
CO2: 19 mmol/L — ABNORMAL LOW (ref 22–32)
Calcium: 8.7 mg/dL — ABNORMAL LOW (ref 8.9–10.3)
Chloride: 112 mmol/L — ABNORMAL HIGH (ref 98–111)
Creatinine, Ser: 0.94 mg/dL (ref 0.61–1.24)
GFR calc Af Amer: >60 mL/min (ref >60)
GFR calc non Af Amer: >60 mL/min (ref >60)
Glucose, Bld: 169 mg/dL — ABNORMAL HIGH (ref 70–99)
Potassium: 4.3 mmol/L (ref 3.5–5.1)
Sodium: 142 mmol/L (ref 135–145)

## 2018-04-05 LAB — PROTIME-INR
INR: 1.16
Prothrombin Time: 14.7 seconds (ref 11.4–15.2)

## 2018-04-05 MED ORDER — WARFARIN SODIUM 5 MG PO TABS: 7.5000 mg | ORAL_TABLET | Freq: Once | ORAL | Status: DC

## 2018-04-05 NOTE — Progress Notes (Signed)
[place] for warfarin Indication: h/o atrial fibrillation  Allergies  Allergen Reactions  . Naproxen Hives    Patient Measurements: Height: 5\' 8"  (172.7 cm) Weight: 205 lb 11 oz (93.3 kg) IBW/kg (Calculated) : 68.4 Vital Signs: Temp: 97.8 F (36.6 C) (01/30 0516) Temp Source: Oral (01/30 0516) BP: 134/92 (01/30 0516) Pulse Rate: 109 (01/30 0516)  Labs: Recent Labs    04/03/18 0909  04/03/18 1500 04/04/18 0522 04/05/18 0527  HGB  --    < > 12.8* 11.4* 10.9*  HCT  --   --  40.0 35.7* 34.2*  PLT  --   --  204 192 183  LABPROT 11.9  --   --  12.8 14.7  INR 0.88  --   --  0.98 1.16  CREATININE  --   --  1.02 0.97 0.94   < > = values in this interval not displayed.    Estimated Creatinine Clearance: 75.3 mL/min (by C-G formula based on SCr of 0.94 mg/dL).   Medical History: Past Medical History:  Diagnosis Date  . Acute rheumatic heart disease, unspecified    childhood, age  28 & 40  . Acute rheumatic pericarditis   . Atrial fibrillation ([place])    history  . Diverticulosis   . Dysrhythmia   . 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 External hemorrhoids without mention of complication   . 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 Lesion of ulnar nerve   . 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 Other and unspecified hyperlipidemia   . Pre-diabetes   . Previous back surgery 1978, jan 2007  . Psychosexual dysfunction with inhibited sexual excitement   . SOB (shortness of breath)    "with heavy exercise"  . Stroke ([place]) 08/2013   "I WAS IN AFIB AND THREW A CLOT, THE EFFECTS WERE TRANSITORY AND DIDNT LAST BUT FOR 30 MINUTES"   . Thoracic aortic aneurysm ([facility])     Medications:  Warfarin maintenance plan:  5 mg (5 mg x 1) every Sun, Tue, Thu; 7.5 mg (5 mg x 1.5) all other days  Weekly warfarin total:  45 mg   Assessment: 78 y/o M on chronic warfarin for atrial fibrillation now s/p THA to resume warfarin. Patient was  instructed by warfarin clinic to take last dose on 1/22 prior to surgery and bridge with Lovenox 1.5 mg/kg daily. Of note, INR was supra-therapeutic 1/15 at 3.7.   CHA2DS2/VAS=6  Today 04/05/18 10:26 AM   POD#2  INR 1.16  No bleeding reported  CBC ok   Goal of Therapy:  INR 2-3 Monitor platelets by anticoagulation protocol: Yes   Plan:   Will order warfarin 7.5 mg once tonight.   Lovenox 40 mg daily ordered to start 1/29  Daily INR  Monitor for bleeding/thrombosis   Ulice Dash D 04/05/2018,10:26 AM

## 2018-04-05 NOTE — Progress Notes (Signed)
Physical Therapy Treatment Patient Details Name: [name] [name] MRN: [id number] DOB: 21-Aug-1940 Today's Date: 04/05/2018    History of Present Illness 78 yo male s/p R DA-THA on 04/03/18. PMH includes LBP, mitral and aortic valve replacement, afib and aflutter on chronic anticoag, pre-DM, L frontal CVA 2015, ShOB, cardioversion multiple times.    PT Comments    Patient is ready for Dc.   Follow Up Recommendations  Follow surgeon's recommendation for DC plan and follow-up therapies;Supervision for mobility/OOB     Equipment Recommendations  None recommended by PT    Recommendations for Other Services       Precautions / Restrictions Precautions Precautions: Fall Precaution Comments: has atrial  flutter    Mobility  Bed Mobility Overal bed mobility: Modified Independent                Transfers Overall transfer level: Needs assistance Equipment used: Rolling walker (2 wheeled) Transfers: Sit to/from Stand Sit to Stand: Modified independent (Device/Increase time)            Ambulation/Gait Ambulation/Gait assistance: Supervision Gait Distance (Feet): 400 Feet Assistive device: Rolling walker (2 wheeled) Gait Pattern/deviations: Step-through pattern     General Gait Details: gait is smoothe, not antalgic, patient did ambulate  x 10' with 2 canes.   Stairs   Stairs assistance: Min guard Stair Management: Step to pattern;Forwards;With cane Number of Stairs: 2 General stair comments: practiced with 2 canes , demonstrated to  use RW, place it at top and bottom of steps  and reach forward    Wheelchair Mobility    Modified Rankin (Stroke Patients Only)       Balance                                            Cognition Arousal/Alertness: Awake/alert                                            Exercises Total Joint Exercises Ankle Circles/Pumps: AROM;10 reps Quad Sets: AROM;Both;10 reps Short Arc Quad:  AROM;Right;10 reps Heel Slides: AAROM;Right;10 reps Hip ABduction/ADduction: AAROM;Right;10 reps Straight Leg Raises: AROM;Right;10 reps    General Comments        Pertinent Vitals/Pain Pain Score: 1  Pain Location: R hip  Pain Descriptors / Indicators: Sore Pain Intervention(s): Ice applied;Premedicated before session    Home Living                      Prior Function            PT Goals (current goals can now be found in the care plan section) Progress towards PT goals: Progressing toward goals    Frequency    7X/week      PT Plan Current plan remains appropriate    Co-evaluation              AM-PAC PT "6 Clicks" Mobility   Outcome Measure  Help needed turning from your back to your side while in a flat bed without using bedrails?: None Help needed moving from lying on your back to sitting on the side of a flat bed without using bedrails?: None Help needed moving to and from a bed to a chair (including a wheelchair)?: A Little Help  needed standing up from a chair using your arms (e.g., wheelchair or bedside chair)?: A Little Help needed to walk in hospital room?: A Little Help needed climbing 3-5 steps with a railing? : A Little 6 Click Score: 20    End of Session   Activity Tolerance: Patient tolerated treatment well;No increased pain Patient left: in bed;with call bell/phone within reach Nurse Communication: Mobility status PT Visit Diagnosis: Unsteadiness on feet (R26.81) Pain - Right/Left: Right Pain - part of body: Hip     Time: 1071-2524 PT Time Calculation (min) (ACUTE ONLY): 26 min  Charges:  $Gait Training: 8-22 mins $Therapeutic Exercise: 8-22 mins                     Tresa Endo PT Acute Rehabilitation Services Pager [phone] Office [phone]    Claretha Cooper 04/05/2018, 9:53 AM

## 2018-04-05 NOTE — Op Note (Signed)
Subjective: 2 Days Post-Op Procedure(s) (LRB): TOTAL HIP ARTHROPLASTY ANTERIOR APPROACH (Right)    Patient reports pain as mild.  Doing well this am.  No events, specifically from cardiac standpoint.  Making good progress  Objective:   VITALS:   Vitals:   04/04/18 2152 04/05/18 0516  BP: 96/68 (!) 134/92  Pulse: 96 (!) 109  Resp: 17 18  Temp: 97.9 F (36.6 C) 97.8 F (36.6 C)  SpO2: 98% 100%    Neurovascular intact Incision: dressing C/D/I  LABS Recent Labs    04/03/18 1500 04/04/18 0522 04/05/18 0527  HGB 12.8* 11.4* 10.9*  HCT 40.0 35.7* 34.2*  WBC 15.9* 15.9* 20.5*  PLT 204 192 183    Recent Labs    04/03/18 1500 04/04/18 0522  NA  --  135  K  --  4.4  BUN  --  21  CREATININE 1.02 0.97  GLUCOSE  --  172*    Recent Labs    04/04/18 0522 04/05/18 0527  INR 0.98 1.16     Assessment/Plan: 2 Days Post-Op Procedure(s) (LRB): TOTAL HIP ARTHROPLASTY ANTERIOR APPROACH (Right)   Up with therapy  Home today after therapy RTC in 2 weeks Goals reviewed Rx provided

## 2018-04-05 NOTE — Telephone Encounter (Signed)
Pt was on TCM report admitted 04/03/18 for Right hip primary OA / pain. Pt proceed to have an right total hip arthroplasty procedure. Pt tolerated procedure well. D/C 04/05/18 and will follow-up w/surgeon in 2 wks w/ [place]. [place]  PA-C.Marland KitchenJohny Chess

## 2018-04-09 ENCOUNTER — Ambulatory Visit (INDEPENDENT_AMBULATORY_CARE_PROVIDER_SITE_OTHER): Payer: Medicare Other

## 2018-04-09 DIAGNOSIS — Z5181 Encounter for therapeutic drug level monitoring: Secondary | ICD-10-CM

## 2018-04-09 DIAGNOSIS — Z7901 Long term (current) use of anticoagulants: Secondary | ICD-10-CM | POA: Diagnosis not present

## 2018-04-09 DIAGNOSIS — I4892 Unspecified atrial flutter: Secondary | ICD-10-CM

## 2018-04-09 DIAGNOSIS — I482 Chronic atrial fibrillation, unspecified: Secondary | ICD-10-CM

## 2018-04-09 LAB — POCT INR: INR: 1.9 — AB (ref 2.0–3.0)

## 2018-04-09 NOTE — Patient Instructions (Signed)
Description   Continue taking 1.5 tablets daily except 1 tablet on Tuesdays, Thursdays and Sundays. Stop Lovenox. Recheck INR in 2 weeks. Call and let us know if scheduled for any procedures [phone] or medication changes.

## 2018-04-10 NOTE — Discharge Summary (Signed)
Physician Discharge Summary  Patient ID: [name] [name] MRN: [id number] DOB/AGE: February 08, 1941 78 y.o.  Admit date: 04/03/2018 Discharge date: 04/05/2018   Procedures:  Procedure(s) (LRB): TOTAL HIP ARTHROPLASTY ANTERIOR APPROACH (Right)  Attending Physician:  Dr. Paralee Cancel   Admission Diagnoses:   Right hip primary OA / pain  Discharge Diagnoses:  Principal Problem:   S/P right THA, AA Active Problems:   Obese  Past Medical History:  Diagnosis Date  . Acute rheumatic heart disease, unspecified    childhood, age  89 & 25  . Acute rheumatic pericarditis   . Atrial fibrillation ([place])    history  . Diverticulosis   . Dysrhythmia   . 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 External hemorrhoids without mention of complication   . 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 Lesion of ulnar nerve   . 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 Other and unspecified hyperlipidemia   . Pre-diabetes   . Previous back surgery 1978, jan 2007  . Psychosexual dysfunction with inhibited sexual excitement   . SOB (shortness of breath)    "with heavy exercise"  . Stroke ([place]) 08/2013   "I WAS IN AFIB AND THREW A CLOT, THE EFFECTS WERE TRANSITORY AND DIDNT LAST BUT FOR 30 MINUTES"   . Thoracic aortic aneurysm [facility])     HPI:    [name] [name], 78 y.o. male, has a history of pain and functional disability in the right hip(s) due to arthritis and patient has failed non-surgical conservative treatments for greater than 12 weeks to include NSAID's and/or analgesics, use of assistive devices and activity modification.  Onset of symptoms was gradual starting 1-1.5 years ago with gradually worsening course since that time.The patient noted no past surgery on the right hip(s).  Patient currently rates pain in the right hip at 8 out of 10 with activity. Patient has worsening of pain with activity and weight bearing, trendelenberg gait, pain that interfers with activities of daily living and pain  with passive range of motion. Patient has evidence of periarticular osteophytes and joint space narrowing by imaging studies. This condition presents safety issues increasing the risk of falls. There is no current active infection.  Risks, benefits and expectations were discussed with the patient.  Risks including but not limited to the risk of anesthesia, blood clots, nerve damage, blood vessel damage, failure of the prosthesis, infection and up to and including death.  Patient understand the risks, benefits and expectations and wishes to proceed with surgery.   PCP: [name], MD   Discharged Condition: good  Hospital Course:  Patient underwent the above stated procedure on 04/03/2018. Patient tolerated the procedure well and brought to the recovery room in good condition and subsequently to the floor.  POD #1 BP: 117/78 ; Pulse: 84 ; Temp: 97.5 F (36.4 C) ; Resp: 14 Patient reports pain as mild, pain controlled.  No events throughout the night. He has had issue after previous surgeries with his heart.  Plan on keeping him here to make sure he has no incidents throughout the day.  Plan for discharge tomorrowdue to underlying medical co-morbidities, pain control and need for inpatient therapy to meet goal of being discharged home safely with family/caregiver. Dorsiflexion/plantar flexion intact, incision: dressing C/D/I, no cellulitis present and compartment soft.   LABS  Basename    HGB     11.4  HCT     35.7  POD #2  BP: 134/92 ; Pulse: 109 ; Temp: 97.8 F (36.6 C) ; Resp: 18 Patient reports pain as mild, pain controlled.  No events throughout the night. He has had issue after previous surgeries with his heart.  Feels that he is progressing well and ready to be discharged home.  Neurovascular intact and incision: dressing C/D/I.   LABS  Basename    HGB     10.9  HCT     34.2    Discharge Exam: General appearance: alert, cooperative and no distress Extremities: Homans sign  is negative, no sign of DVT, no edema, redness or tenderness in the calves or thighs and no ulcers, gangrene or trophic changes  Disposition:  Home with follow up in 2 weeks   Follow-up Information    Danae Orleans, PA-C. Go on 04/18/2018.   Specialty:  Orthopedic Surgery Why:  You are scheduled for a post-operative appointment on 04-18-2018 at 2:15 pm. Contact information: [address] [place] [address] [phone]           Discharge Instructions    Call MD / Call 911   Complete by:  As directed    If you experience chest pain or shortness of breath, CALL 911 and be transported to the hospital emergency room.  If you develope a fever above 101 F, pus (white drainage) or increased drainage or redness at the wound, or calf pain, call your surgeon's office.   Change dressing   Complete by:  As directed    Maintain surgical dressing until follow up in the clinic. If the edges start to pull up, may reinforce with tape. If the dressing is no longer working, may remove and cover with gauze and tape, but must keep the area dry and clean.  Call with any questions or concerns.   Constipation Prevention   Complete by:  As directed    Drink plenty of fluids.  Prune juice may be helpful.  You may use a stool softener, such as Colace (over the counter) 100 mg twice a day.  Use MiraLax (over the counter) for constipation as needed.   Diet - low sodium heart healthy   Complete by:  As directed    Discharge instructions   Complete by:  As directed    Maintain surgical dressing until follow up in the clinic. If the edges start to pull up, may reinforce with tape. If the dressing is no longer working, may remove and cover with gauze and tape, but must keep the area dry and clean.  Follow up in 2 weeks at [facility]. Call with any questions or concerns.   Increase activity slowly as tolerated   Complete by:  As directed    Weight bearing as tolerated with assist  device (walker, cane, etc) as directed, use it as long as suggested by your surgeon or therapist, typically at least 4-6 weeks.   TED hose   Complete by:  As directed    Use stockings (TED hose) for 2 weeks on both leg(s).  You may remove them at night for sleeping.      Allergies as of 04/05/2018      Reactions   Naproxen Hives      Medication List    TAKE these medications   atorvastatin 80 MG tablet Commonly known as:  LIPITOR Take 1 tablet (80 mg total) by mouth every evening.   clobetasol cream 0.05 % Commonly known as:  TEMOVATE Apply 1 application  topically daily as needed (for skin irritation).   Co Q-10 300 MG Caps Take 300 mg by mouth daily.   COSAMIN DS PO Take 1 tablet by mouth 2 (two) times daily.   docusate sodium 100 MG capsule Commonly known as:  COLACE Take 1 capsule (100 mg total) by mouth 2 (two) times daily. What changed:  when to take this   dofetilide 500 MCG capsule Commonly known as:  TIKOSYN Take 1 capsule (500 mcg total) by mouth 2 (two) times daily.   enoxaparin 150 MG/ML injection Commonly known as:  LOVENOX Inject 1 mL (150 mg total) into the skin daily.   ferrous sulfate 325 (65 FE) MG tablet Commonly known as:  FERROUSUL Take 1 tablet (325 mg total) by mouth 3 (three) times daily with meals.   fish oil-omega-3 fatty acids 1000 MG capsule Take 1 g by mouth 2 (two) times daily.   furosemide 20 MG tablet Commonly known as:  LASIX Take 1 tablet (20 mg total) by mouth daily.   HYDROcodone-acetaminophen 7.5-325 MG tablet Commonly known as:  NORCO Take 1-2 tablets by mouth every 4 (four) hours as needed for moderate pain.   loratadine 10 MG tablet Commonly known as:  CLARITIN Take 10 mg by mouth daily as needed for allergies.   losartan 25 MG tablet Commonly known as:  COZAAR Take 1 tablet (25 mg total) by mouth daily.   methocarbamol 500 MG tablet Commonly known as:  ROBAXIN Take 1 tablet (500 mg total) by mouth every 6 (six)  hours as needed for muscle spasms.   metoprolol succinate 25 MG 24 hr tablet Commonly known as:  TOPROL-XL Take 3 tablets (75 mg total) by mouth 2 (two) times daily. Take with or immediately following a meal.   MULTIVITAL-M Tabs Take 1 tablet by mouth daily.   polyethylene glycol packet Commonly known as:  MIRALAX / GLYCOLAX Take 17 g by mouth 2 (two) times daily.   potassium chloride SA 20 MEQ tablet Commonly known as:  K-DUR,KLOR-CON Take 1 tablet (20 mEq total) by mouth daily.   PROBIOTIC FORMULA PO Take 1 capsule by mouth 2 (two) times daily.   Vitamin B-12 5000 MCG Subl Place 5,000 mcg under the tongue daily at 2 PM.   warfarin 5 MG tablet Commonly known as:  COUMADIN Take as directed. If you are unsure how to take this medication, talk to your nurse or doctor. Original instructions:  Take as directed by Coumadin Clinic            Discharge Care Instructions  (From admission, onward)         Start     Ordered   04/04/18 0000  Change dressing    Comments:  Maintain surgical dressing until follow up in the clinic. If the edges start to pull up, may reinforce with tape. If the dressing is no longer working, may remove and cover with gauze and tape, but must keep the area dry and clean.  Call with any questions or concerns.   04/04/18 [id number]           Signed: [place] Pugh. Ascencion Coye   PA-C  04/10/2018, 8:41 AM

## 2018-04-20 DIAGNOSIS — R972 Elevated prostate specific antigen [PSA]: Secondary | ICD-10-CM | POA: Diagnosis not present

## 2018-04-23 ENCOUNTER — Ambulatory Visit (INDEPENDENT_AMBULATORY_CARE_PROVIDER_SITE_OTHER): Payer: Medicare Other

## 2018-04-23 DIAGNOSIS — Z5181 Encounter for therapeutic drug level monitoring: Secondary | ICD-10-CM | POA: Diagnosis not present

## 2018-04-23 DIAGNOSIS — I4892 Unspecified atrial flutter: Secondary | ICD-10-CM | POA: Diagnosis not present

## 2018-04-23 DIAGNOSIS — I482 Chronic atrial fibrillation, unspecified: Secondary | ICD-10-CM

## 2018-04-23 DIAGNOSIS — Z7901 Long term (current) use of anticoagulants: Secondary | ICD-10-CM

## 2018-04-23 LAB — POCT INR: INR: 2.7 (ref 2.0–3.0)

## 2018-04-23 NOTE — Patient Instructions (Signed)
Description   Continue on same dosage 1.5 tablets daily except 1 tablet on Tuesdays, Thursdays and Sundays. Recheck INR in 4 weeks. Call and let us know if scheduled for any procedures [phone] or medication changes.

## 2018-05-02 ENCOUNTER — Other Ambulatory Visit (HOSPITAL_COMMUNITY): Payer: Self-pay | Admitting: Cardiology

## 2018-05-02 MED ORDER — METOPROLOL SUCCINATE ER 25 MG PO TB24: 75.0000 mg | ORAL_TABLET | Freq: Two times a day (BID) | ORAL | 3 refills | Status: DC

## 2018-05-02 NOTE — Telephone Encounter (Signed)
Pt called stating that he received 15 day supply of Toprol from mail order pharmacy.  Quantity adjusted and submitted to mail order pharmacy. Pt aware and appreciative.

## 2018-05-14 DIAGNOSIS — Z96641 Presence of right artificial hip joint: Secondary | ICD-10-CM | POA: Diagnosis not present

## 2018-05-14 DIAGNOSIS — Z471 Aftercare following joint replacement surgery: Secondary | ICD-10-CM | POA: Diagnosis not present

## 2018-05-15 ENCOUNTER — Ambulatory Visit (HOSPITAL_COMMUNITY)
Admission: RE | Admit: 2018-05-15 | Discharge: 2018-05-15 | Disposition: A | Discharge status: Home / Self Care | Payer: Medicare Other | Source: Ambulatory Visit | Attending: Cardiology | Admitting: Cardiology

## 2018-05-15 VITALS — BP 132/80 | HR 91 | Wt 208.2 lb

## 2018-05-15 DIAGNOSIS — Z8673 Personal history of transient ischemic attack (TIA), and cerebral infarction without residual deficits: Secondary | ICD-10-CM | POA: Insufficient documentation

## 2018-05-15 DIAGNOSIS — I110 Hypertensive heart disease with heart failure: Secondary | ICD-10-CM | POA: Diagnosis not present

## 2018-05-15 DIAGNOSIS — I484 Atypical atrial flutter: Secondary | ICD-10-CM | POA: Insufficient documentation

## 2018-05-15 DIAGNOSIS — I4892 Unspecified atrial flutter: Secondary | ICD-10-CM | POA: Diagnosis not present

## 2018-05-15 DIAGNOSIS — R9431 Abnormal electrocardiogram [ECG] [EKG]: Secondary | ICD-10-CM | POA: Diagnosis not present

## 2018-05-15 DIAGNOSIS — E785 Hyperlipidemia, unspecified: Secondary | ICD-10-CM | POA: Diagnosis not present

## 2018-05-15 DIAGNOSIS — Z7901 Long term (current) use of anticoagulants: Secondary | ICD-10-CM | POA: Insufficient documentation

## 2018-05-15 DIAGNOSIS — I712 Thoracic aortic aneurysm, without rupture: Secondary | ICD-10-CM | POA: Insufficient documentation

## 2018-05-15 DIAGNOSIS — Z79899 Other long term (current) drug therapy: Secondary | ICD-10-CM | POA: Insufficient documentation

## 2018-05-15 DIAGNOSIS — I059 Rheumatic mitral valve disease, unspecified: Secondary | ICD-10-CM | POA: Diagnosis not present

## 2018-05-15 DIAGNOSIS — Z952 Presence of prosthetic heart valve: Secondary | ICD-10-CM

## 2018-05-15 DIAGNOSIS — I099 Rheumatic heart disease, unspecified: Secondary | ICD-10-CM | POA: Diagnosis not present

## 2018-05-15 LAB — MAGNESIUM: Magnesium: 2.2 mg/dL (ref 1.7–2.4)

## 2018-05-15 LAB — BASIC METABOLIC PANEL
Anion gap: 10 (ref 5–15)
BUN: 20 mg/dL (ref 8–23)
CO2: 24 mmol/L (ref 22–32)
CREATININE: 0.98 mg/dL (ref 0.61–1.24)
Calcium: 9.1 mg/dL (ref 8.9–10.3)
Chloride: 104 mmol/L (ref 98–111)
GFR calc Af Amer: >60 mL/min (ref >60)
GFR calc non Af Amer: >60 mL/min (ref >60)
Glucose, Bld: 121 mg/dL — ABNORMAL HIGH (ref 70–99)
Potassium: 4.1 mmol/L (ref 3.5–5.1)
SODIUM: 138 mmol/L (ref 135–145)

## 2018-05-15 NOTE — Progress Notes (Signed)
Patient ID: [name] [name], male   DOB: 01-27-1941, 78 y.o.   MRN: [id number] PCP: Dr. Ronnald Ramp Cardiology: Dr. [name]  78 y.o. 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  He also has an ascending aortic aneurysm.   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  I started him on dronedarone and coumadin with plan for cardioversion after 4 wks of therapeutic anticoagulation if he did not convert back to NSR on his own.  He went back into NSR spontaneously and later stopped coumadin. Atrial flutter recurred in 3/15 and again resolved spontaneously.  In 6/15, patient was admitted with transient dysarthria (resolved in about 10 minutes).  However, he was found to have evidence for CVA by MRI.  Therefore, he was started on coumadin. He was noted to be in atypical atrial flutter again.  Ranolazine was added to dronedarone.    In 8/15, Mr Bostock was seen in consultation at the [facility] clinic.  He had atrial flutter ablation at [facility] in 11/15.    He was seen in followup at [facility] in 2/16.  Ranolazine was stopped.  Echo in 2/16 showed EF 55% with stable bioprosthetic mitral and aortic valves.  CTA chest showed stable 5 cm ascending aortic aneurysm.  He went back into atypical atrial flutter in 4/16.  Ranolazine was restarted and he was cardioverted back to NSR.    Last visit to [facility] was in 5/18.  Echo at that time showed EF 60-65% with stable bioprosthetic mitral and aortic valves.  CTA showed 5.0 cm aortic root and ascending aorta.  Cardiolite in 12/18 showed no ischemia. He wore a 30 day monitor in 12/18 with no arrhythmia.    He went into atrial flutter again during a respiratory illness in 2/19.  On 04/24/17, he was cardioverted back to NSR.  He thinks that he went  into atrial flutter again later on that day. He was back in atrial flutter at followup appointment. He was then admitted for Tikosyn loading in 4/19 and was cardioverted back to NSR after the load.   He had atrial fibrillation again in 6/19 with DCCV.   He was seen at the [facility] in 9/19.  Echo showed normal LV and RV systolic function, bioprosthetic MV with mean gradient up to 9 mmHg, normal bioprosthetic AoV.  CTA chest showed aortic root up to 5.3 cm.   He was back in atrial flutter in 12/19, had DCCV back to NSR.  Echo in 12/19 showed EF 60-65%, normal bioprosthetic aortic valve, bioprosthetic mitral valve with mean gradient 4 mmHg and no MR, severe LAE, aortic root 4.8 cm.   He had right THR in 3/50 with no complications.   He returns for followup of atrial flutter and valvular disease. He has been in atrial flutter for a couple of months now. He is recovering well from hip surgery and has started exercising.  No exertional dyspnea.  No chest pain.  He does not feel palpitations but knows he has been in atrial flutter because of his Apple watch.   ECG (12/12/11): Atypical atrial flutter versus atrial tachycardia with rate around 100 bpm, QTc normal.  ECG (12/29/11): NSR, 1st degree AV block PR 242 msec.    ECG (12/13): NSR, 1st degree AV block PR 244 msec  ECG (10/14): NSR, 1st degree AV block ECG (3/15): Atypical atrial flutter with rate 87, inferior T wave inversions ECG (6/15): Atypical atrial flutter with rate 77 ECG (8/15): atypical atrial flutter rate 82 ECG (10/15): NSR, 1st degree AV block ECG (12/15): NSR, 1st degree AV block, QTc 410 msec ECG (5/16): NSR, 1st degree AV block, QTc 419 msec ECG (5/17): NSR, 1st degree AV block 270 msec, QTc 433 ECG (2/18): NSR, 1st degree AVB, nonspecific ST-T changes, QTc 435 msec ECG (11/18): NSR, 1st degree AVB, QTc 424 msec ECG (2/19, personally reviewed): atypical atrial flutter at 87, QTc 442 msec ECG (4/19, personally  reviewed): NSR, 1st degree AVB, QTc 453 msec ECG (10/19, personally reviewed): NSR, 1st degree AVB, QTc 448 msec ECG (1/20, personally reviewed): atypical atrial flutter in 90s, QTc 472 msec.  ECG (3/20, personally reviewed): atypical flutter QTc 480 msec  Labs (3/14): LDL 64, TSH normal, K 3.9, creatinine 1.0     Labs (6/15): LDL 54, HDL 48, creatinine 0.95   Labs (9/15): LDL 54, HDL 25 Labs (10/15): K 4.5, creatinine 1.3, AST 45, ALT 64 Labs (4/16): K 4.3, creatinine 1.28, HCT 40.7 Labs (5/17): K 4.1, creatinine 1.16, LDL 66, HDL 47 Labs (2/18): LDL 42, HDL 43, K 4.4, creatinine 1.48 Labs (3/18): hgb 13.8 Labs (2/19): LDL 59 Labs (4/19): K 3.9, creatinine 0.97, Mg 2.0 Labs (6/19): K 4.4, creatinine 0.9 Labs (10/19): LDL 84 Labs (12/19): K 4.2, creatinine 1.13, hgb 13.9 Labs (1/20): K 4.3, creatinine 0.94                                                                                                                                 PMH: 1. Atrial fibrillation: s/p MAZE and LAA ligation in 2006.  No documented atrial fibrillation since operation.  2. Atypical atrial flutter: 10/13, 3/15, and again in 6/15.  Recurrent 4/16 with DCCV.  - Event monitor (12/18): no significant arrhythmia.  - Recurrence in 2/19: DCCV to NSR but quickly recurred.  - Admitted in 4/19 for Tikosyn loading and cardioverted to NSR.  - DCCV to NSR in 6/19.  - DCCV to NSR in 12/19.  3. 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   - Echo 5/11 with EF 60-65%, normal bioprosthetic aortic valve, normal bioprosthetic mitral valve, moderate LAE.   - Echo 11/12 at [facility] looked ok per report.  - Echo (10/13) with EF 55-60%, mild LVH, normal bioprosthetic aortic and mitral valves, moderate LAE, PA systolic pressure 35 mmHg.   - Echo (6/15) with EF 60%, moderate LVH, normal-appearing bioprosthetic MV and AoV, PA systolic pressure 32 mmHg.   - Echo  9/15 [facility]) with EF 55%, mild LVH, PASP 52 mmHg, severe LAE, bioprosthetic mitral valve with no MR and mean gradient 11 mmHg, moderate TR, bioprosthetic aortic valve with no AI and mean gradient 9 mmHg, dilated ascending  aorta to 5 cm.   - Echo (2/16, [facility]) with EF 55%, stable bioprosthetic aortic and mitral valves (AoV mean gradient 7 mmHg, MV mean gradient 4 mmHg), normal RV size and systolic function.  - Echo (12/16, [facility]) with EF 58%, mild RV dilation with low normal systolic function, PA systolic pressure 34 mmHg, bioprosthetic MV and AoV functioning normally.  - Echo (5/18, [facility]) with EF 60-65%, RV normal size and systolic function, moderate LAE, bioprosthetic MV mean gradient 6 with no MR, bioprosthetic aortic valve with mean gradient 11 and no AI, PASP 41 mmHg.  - Echo (2/19): EF 60-65%, moderate LVH, bioprosthetic aortic and mitral valves functioning normally, PASP 23 mmHg, aortic root 4.5 cm.  - Echo (9/19): EF 56%, normal RV size and systolic function, bioprosthetic MV with mean gradient 9 mmHg, trivial MR, bioprosthetic aortic valve with mean gradient 12 mmHg, PASP 51 mmHg.  - Echo (12/19): EF 60-65%, normal bioprosthetic aortic valve, bioprosthetic mitral valve with mean gradient 4 mmHg and no MR, severe LAE, aortic root 4.8 cm.  4. Hyperlipidemia 5. L-spine surgery 6. LHC with no significant coronary disease in 2006 pre-op.  - Cardiolite (12/18): No ischemia.  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  CTA chest 9/15 with dilated sinuses of valsalva to 5.0 cm and ascending aorta 4.9 cm. CTA chest (2/16) with 5.0 cm ascending aorta.  CTA chest (12/16) with 4.9 cm aortic root at SOV, 4.8 cm ascending aorta.  - CT chest (5/18, [facility]): 5.0 cm aortic root, 5.0 cm ascending aorta.  - CTA chest (10/19, [facility]): 5.3 cm aortic root, 5.2  cm ascending aorta.  9. Chronic diastolic CHF 10. Right THR (1/20)  SH: Married, retired Forensic psychologist [name], [place], [place]).  2 sons.  Never smoked.   FH: No premature CAD.   ROS: All systems reviewed and negative except as per HPI.  Current Outpatient Medications  Medication Sig Dispense Refill  . atorvastatin (LIPITOR) 80 MG tablet Take 1 tablet (80 mg total) by mouth every evening. 90 tablet 1  . clobetasol cream (TEMOVATE) 1.94 % Apply 1 application topically daily as needed (for skin irritation).     . Coenzyme Q10 (CO Q-10) 300 MG CAPS Take 300 mg by mouth daily.    . Cyanocobalamin (VITAMIN B-12) 5000 MCG SUBL Place 5,000 mcg under the tongue daily at 2 PM.     . docusate sodium (COLACE) 100 MG capsule Take 1 capsule (100 mg total) by mouth 2 (two) times daily. 10 capsule 0  . dofetilide (TIKOSYN) 500 MCG capsule Take 1 capsule (500 mcg total) by mouth 2 (two) times daily. 180 capsule 1  . enoxaparin (LOVENOX) 150 MG/ML injection Inject 1 mL (150 mg total) into the skin daily. 10 Syringe 1  . ferrous sulfate (FERROUSUL) 325 (65 FE) MG tablet Take 1 tablet (325 mg total) by mouth 3 (three) times daily with meals.  3  . fish oil-omega-3 fatty acids 1000 MG capsule Take 1 g by mouth 2 (two) times daily.     . furosemide (LASIX) 20 MG tablet TAKE 1 TABLET ONCE DAILY. 30 tablet 0  . Glucosamine-Chondroitin (COSAMIN DS PO) Take 1 tablet by mouth 2 (two) times daily.    [name] HYDROcodone-acetaminophen (NORCO) 7.5-325 MG tablet Take 1-2 tablets by mouth every 4 (four) hours as needed for moderate pain. 60 tablet 0  . loratadine (CLARITIN) 10 MG tablet Take  10 mg by mouth daily as needed for allergies.     [name] losartan (COZAAR) 25 MG tablet Take 1 tablet (25 mg total) by mouth daily. 90 tablet 3  . methocarbamol (ROBAXIN) 500 MG tablet Take 1 tablet (500 mg total) by mouth every 6 (six) hours as needed for muscle spasms. 40 tablet 0  . metoprolol succinate (TOPROL-XL) 25 MG 24 hr tablet Take 3  tablets (75 mg total) by mouth 2 (two) times daily. Take with or immediately following a meal. 540 tablet 3  . Multiple Vitamins-Minerals (MULTIVITAL-M) TABS Take 1 tablet by mouth daily.     . polyethylene glycol (MIRALAX / GLYCOLAX) packet Take 17 g by mouth 2 (two) times daily. 14 each 0  . potassium chloride SA (K-DUR,KLOR-CON) 20 MEQ tablet TAKE 1 TABLET ONCE DAILY. 30 tablet 0  . Probiotic Product (PROBIOTIC FORMULA PO) Take 1 capsule by mouth 2 (two) times daily.     [name] warfarin (COUMADIN) 5 MG tablet Take as directed by Coumadin Clinic 120 tablet 1   No current facility-administered medications for this encounter.     BP 132/80   Pulse 91   Wt 94.4 kg (208 lb 3.2 oz)   SpO2 99%   BMI 31.66 kg/m  General: NAD Neck: No JVD, no thyromegaly or thyroid nodule.  Lungs: Clear to auscultation bilaterally with normal respiratory effort. CV: Nondisplaced PMI.  Heart irregular S1/S2, no S3/S4, no murmur.  No peripheral edema.  No carotid bruit.  Normal pedal pulses.  Abdomen: Soft, nontender, no hepatosplenomegaly, no distention.  Skin: Intact without lesions or rashes.  Neurologic: Alert and oriented x 3.  Psych: Normal affect. Extremities: No clubbing or cyanosis.  HEENT: Normal.   Assessment/Plan: 1. Atypical atrial flutter: Patient had a MAZE procedure with LAA ligation at the time of his valve replacement.  Since then, he has had occasional episodes of atypical atrial flutter. He had episodes in 10/13 and 3/15 that converted back to NSR on dronedarone and ranolazine. He was noted to be back in atrial flutter in 6/15 in the setting of a CVA.  He had ablation at [facility] in 11/15.  Atrial flutter recurred after ranolazine was stopped and he had DCCV again in 4/16.  Atrial flutter recurred in 2/19, DCCV back to NSR but atrial flutter quickly recurred. Tikosyn was then loaded and he was cardioverted back to NSR in 4/19.  He required DCCV again in 6/19 and in 12/19 (required 4  shocks).  He went back to atrial flutter about 3 days after 12/19 DCCV and has been in rate-controlled flutter since then.  - Continue coumadin.  - Continue current Toprol XL. - Continue Tikosyn for now.  QTc ok on ECG today. I think he is going to be unlikely to hold NSR without an ablation.  His left atrium is severely enlarged.  He is going to be seen at the [facility] next week where he had his 11/15 ablation.  I will have him continue Tikosyn for now.  If the [facility] does not offer him repeat ablation, he will need to stop Tikosyn.  2. 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  Last echo at [facility] in 9/19 showed normal bioprosthetic aortic valve, but mean gradient was up to 9 mmHg across the mitral valve.  Our echo done here in 12/19 showed normal bioprosthetic aortic valve and bioprosthetic mitral valve with mean gradient 4 mmHg and no MR.    3. HTN:  BP needs good control given ascending aortic aneurysm. BP controlled.  - Continue losartan and Toprol XL.  4. Hyperlipidemia: Good lipids in 10/19.      5. CVA: Suspect this was related to atrial flutter (despite LA appendage ligation).  He is now on coumadin. No residual neurologic deficits.  Continue long-term anticoagulation. - If has to stop coumadin in the future, should get Lovenox bridging.  6. Thoracic aortic aneurysm: Dilated aortic root at sinuses of valsalva and ascending aorta to 5.3 cm, last CTA in 9/19.   - Close followup, may need aortic root/ascending aorta replacement if ascending aorta expands further.  Getting serial CTs at [facility].    - Continue Toprol XL given some evidence that beta blockers may attenuate thoracic aortic aneurysm growth.  7. Chronic diastolic CHF: Patient is not volume overloaded on exam.   - Continue Lasix 20 mg daily. BMET today.   Followup 3 months.    Loralie Champagne 05/15/2018

## 2018-05-15 NOTE — Patient Instructions (Signed)
No medication changes!   Labs today We will only contact you if something comes back abnormal or we need to make some changes. Otherwise no news is good news!   Your physician recommends that you schedule a follow-up appointment in: 3 months with Dr.McLean   Do the following things EVERYDAY: 1) Weigh yourself in the morning before breakfast. Write it down and keep it in a log. 2) Take your medicines as prescribed 3) Eat low salt foods-Limit salt (sodium) to 2000 mg per day.  4) Stay as active as you can everyday 5) Limit all fluids for the day to less than 2 liters   

## 2018-05-18 ENCOUNTER — Other Ambulatory Visit: Payer: Self-pay

## 2018-05-18 ENCOUNTER — Ambulatory Visit (INDEPENDENT_AMBULATORY_CARE_PROVIDER_SITE_OTHER): Payer: Medicare Other | Admitting: Pharmacist

## 2018-05-18 DIAGNOSIS — I482 Chronic atrial fibrillation, unspecified: Secondary | ICD-10-CM | POA: Diagnosis not present

## 2018-05-18 DIAGNOSIS — I4892 Unspecified atrial flutter: Secondary | ICD-10-CM

## 2018-05-18 DIAGNOSIS — Z7901 Long term (current) use of anticoagulants: Secondary | ICD-10-CM | POA: Diagnosis not present

## 2018-05-18 DIAGNOSIS — Z5181 Encounter for therapeutic drug level monitoring: Secondary | ICD-10-CM | POA: Diagnosis not present

## 2018-05-18 LAB — POCT INR: INR: 3.4 — AB (ref 2.0–3.0)

## 2018-05-18 NOTE — Patient Instructions (Signed)
Description   Take one tablet tonight and eat a salad, then continue on same dosage 1.5 tablets daily except 1 tablet on Tuesdays, Thursdays and Sundays. Recheck INR in 3 weeks. Call and let us know if scheduled for any procedures [phone] or medication changes.

## 2018-05-22 DIAGNOSIS — I059 Rheumatic mitral valve disease, unspecified: Secondary | ICD-10-CM | POA: Diagnosis not present

## 2018-05-22 DIAGNOSIS — I712 Thoracic aortic aneurysm, without rupture: Secondary | ICD-10-CM | POA: Diagnosis not present

## 2018-05-22 DIAGNOSIS — I350 Nonrheumatic aortic (valve) stenosis: Secondary | ICD-10-CM | POA: Diagnosis not present

## 2018-05-22 DIAGNOSIS — I484 Atypical atrial flutter: Secondary | ICD-10-CM | POA: Diagnosis not present

## 2018-05-22 DIAGNOSIS — Z952 Presence of prosthetic heart valve: Secondary | ICD-10-CM | POA: Diagnosis not present

## 2018-05-22 DIAGNOSIS — I361 Nonrheumatic tricuspid (valve) insufficiency: Secondary | ICD-10-CM | POA: Diagnosis not present

## 2018-05-23 DIAGNOSIS — I059 Rheumatic mitral valve disease, unspecified: Secondary | ICD-10-CM | POA: Diagnosis not present

## 2018-05-23 DIAGNOSIS — Z8673 Personal history of transient ischemic attack (TIA), and cerebral infarction without residual deficits: Secondary | ICD-10-CM | POA: Diagnosis not present

## 2018-05-23 DIAGNOSIS — I712 Thoracic aortic aneurysm, without rupture: Secondary | ICD-10-CM | POA: Diagnosis not present

## 2018-05-23 DIAGNOSIS — I359 Nonrheumatic aortic valve disorder, unspecified: Secondary | ICD-10-CM | POA: Diagnosis not present

## 2018-05-23 DIAGNOSIS — I484 Atypical atrial flutter: Secondary | ICD-10-CM | POA: Diagnosis not present

## 2018-05-24 DIAGNOSIS — I059 Rheumatic mitral valve disease, unspecified: Secondary | ICD-10-CM | POA: Diagnosis not present

## 2018-05-24 DIAGNOSIS — I052 Rheumatic mitral stenosis with insufficiency: Secondary | ICD-10-CM | POA: Diagnosis not present

## 2018-05-24 DIAGNOSIS — I061 Rheumatic aortic insufficiency: Secondary | ICD-10-CM | POA: Diagnosis not present

## 2018-05-24 DIAGNOSIS — R9431 Abnormal electrocardiogram [ECG] [EKG]: Secondary | ICD-10-CM | POA: Diagnosis not present

## 2018-05-24 DIAGNOSIS — Z8673 Personal history of transient ischemic attack (TIA), and cerebral infarction without residual deficits: Secondary | ICD-10-CM | POA: Diagnosis not present

## 2018-05-24 DIAGNOSIS — I4819 Other persistent atrial fibrillation: Secondary | ICD-10-CM | POA: Diagnosis not present

## 2018-05-24 DIAGNOSIS — I712 Thoracic aortic aneurysm, without rupture: Secondary | ICD-10-CM | POA: Diagnosis not present

## 2018-05-24 DIAGNOSIS — I484 Atypical atrial flutter: Secondary | ICD-10-CM | POA: Diagnosis not present

## 2018-06-06 ENCOUNTER — Telehealth: Payer: Self-pay | Admitting: Pharmacist

## 2018-06-06 NOTE — Telephone Encounter (Signed)

## 2018-06-08 ENCOUNTER — Ambulatory Visit (INDEPENDENT_AMBULATORY_CARE_PROVIDER_SITE_OTHER): Payer: Medicare Other | Admitting: Pharmacist

## 2018-06-08 ENCOUNTER — Other Ambulatory Visit: Payer: Self-pay

## 2018-06-08 DIAGNOSIS — Z7901 Long term (current) use of anticoagulants: Secondary | ICD-10-CM

## 2018-06-08 DIAGNOSIS — I482 Chronic atrial fibrillation, unspecified: Secondary | ICD-10-CM

## 2018-06-08 DIAGNOSIS — I4892 Unspecified atrial flutter: Secondary | ICD-10-CM | POA: Diagnosis not present

## 2018-06-08 DIAGNOSIS — Z5181 Encounter for therapeutic drug level monitoring: Secondary | ICD-10-CM

## 2018-06-08 LAB — POCT INR: INR: 2.8 (ref 2.0–3.0)

## 2018-06-14 ENCOUNTER — Other Ambulatory Visit (HOSPITAL_COMMUNITY): Payer: Self-pay | Admitting: Cardiology

## 2018-06-26 NOTE — Progress Notes (Addendum)
Subjective:   [name] [name] is a 78 y.o. male who presents for Medicare Annual/Subsequent preventive examination.  I connected with patient 06/27/18 at  2:00 PM EDT by a video enabled telemedicine application and verified that I am speaking with the correct person using two identifiers. Patient stated full name and DOB. Patient gave permission to continue with virtual visit. Patient's location was at home and Nurse's location was at [place] office.   Review of Systems:  No ROS.  Medicare Wellness Visit. Additional risk factors are reflected in the social history.  Cardiac Risk Factors include: advanced age (>30men, >93 women);hypertension;male gender Sleep patterns: feels rested on waking, gets up 0-1 times nightly to void and sleeps 7-8 hours nightly.   Home Safety/Smoke Alarms: Feels safe in home. Smoke alarms in place.  Living environment; residence and Firearm Safety: 2-story house. Lives with wife, no needs for DME, good support system Seat Belt Safety/Bike Helmet: Wears seat belt.   PSA-  Lab Results  Component Value Date   PSA 5.87 10/12/2017   PSA 6.06 02/21/2017   PSA 4.94 08/03/2016       Objective:    Vitals: There were no vitals taken for this visit.  There is no height or weight on file to calculate BMI.  Advanced Directives 06/27/2018 04/03/2018 04/03/2018 03/29/2018 08/24/2017 06/08/2017 06/08/2017  Does Patient Have a Medical Advance Directive? Yes Yes Yes Yes Yes Yes Yes  Type of Paramedic of [place];Living will [place];Living will [place];Living will Living will;Healthcare Power of [place];Living will [place];Living will [place];Living will  Does patient want to make changes to medical advance directive? - No - Patient declined No - Patient declined No - Patient declined - No - Patient declined -  Copy of [place]  in Chart? No - copy requested No - copy requested No - copy requested - Yes No - copy requested -  Would patient like information on creating a medical advance directive? - - - - - No - Patient declined -  Pre-existing out of facility DNR order (yellow form or pink MOST form) - - - - - - -    Tobacco Social History   Tobacco Use  Smoking Status Never Smoker  Smokeless Tobacco Never Used     Counseling given: Not Answered  Past Medical History:  Diagnosis Date  . Acute rheumatic heart disease, unspecified    childhood, age  62 & 48  . Acute rheumatic pericarditis   . Atrial fibrillation ([place])    history  . Diverticulosis   . Dysrhythmia   . 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 External hemorrhoids without mention of complication   . 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 Lesion of ulnar nerve   . 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 Other and unspecified hyperlipidemia   . Pre-diabetes   . Previous back surgery 1978, jan 2007  . Psychosexual dysfunction with inhibited sexual excitement   . SOB (shortness of breath)    "with heavy exercise"  . Stroke ([place]) 08/2013   "I WAS IN AFIB AND THREW A CLOT, THE EFFECTS WERE TRANSITORY AND DIDNT LAST BUT FOR 30 MINUTES"   . Thoracic aortic aneurysm [facility])    Past Surgical History:  Procedure Laterality Date  . AORTIC AND MITRAL VALVE REPLACEMENT     09/2004  .  CARDIOVERSION     3 times from 2004-2006  . CARDIOVERSION N/A 09/26/2013   Procedure: CARDIOVERSION;  Surgeon: Larey Dresser, MD;  Location: [facility];  Service: Cardiovascular;  Laterality: N/A;  . CARDIOVERSION N/A 06/19/2014   Procedure: CARDIOVERSION;  Surgeon: Jerline Pain, MD;  Location: [facility];  Service: Cardiovascular;  Laterality: N/A;  . CARDIOVERSION N/A 04/24/2017   Procedure: CARDIOVERSION;  Surgeon: Larey Dresser, MD;  Location: [facility] ENDOSCOPY;  Service: Cardiovascular;  Laterality: N/A;  . CARDIOVERSION N/A 06/08/2017   Procedure:  CARDIOVERSION;  Surgeon: Pixie Casino, MD;  Location: [facility] ENDOSCOPY;  Service: Cardiovascular;  Laterality: N/A;  . CARDIOVERSION N/A 08/24/2017   Procedure: CARDIOVERSION;  Surgeon: Skeet Latch, MD;  Location: [facility] ENDOSCOPY;  Service: Cardiovascular;  Laterality: N/A;  . CARDIOVERSION N/A 02/14/2018   Procedure: CARDIOVERSION;  Surgeon: Larey Dresser, MD;  Location: [name] ENDOSCOPY;  Service: Cardiovascular;  Laterality: N/A;  . COLONOSCOPY    . laminectomies     10/1975 and in 03/2005  . TONSILLECTOMY     1950  . TOTAL HIP ARTHROPLASTY Right 04/03/2018   Procedure: TOTAL HIP ARTHROPLASTY ANTERIOR APPROACH;  Surgeon: Paralee Cancel, MD;  Location: [facility] ORS;  Service: Orthopedics;  Laterality: Right;  70 mins   Family History  Problem Relation Age of Onset  . Macular degeneration Mother        macular degeneration  . Cancer Father        intestinal/GI  . Diabetes Paternal Aunt   . Heart attack Maternal Grandfather   . Diabetes Brother    Social History   Socioeconomic History  . Marital status: Married    Spouse name: Not on file  . Number of children: 2  . Years of education: BS  . Highest education level: Not on file  Occupational History  . Occupation: retired  Scientific laboratory technician  . Financial resource strain: Not hard at all  . Food insecurity:    Worry: Never true    Inability: Never true  . Transportation needs:    Medical: No    Non-medical: No  Tobacco Use  . Smoking status: Never Smoker  . Smokeless tobacco: Never Used  Substance and Sexual Activity  . Alcohol use: Yes    Alcohol/week: 3.0 standard drinks    Types: 1 Glasses of [place], 1 Cans of beer, 1 Shots of liquor per week    Comment: Gertrude Tarbet occasionally  . Drug use: No  . Sexual activity: Yes    Partners: Female  Lifestyle  . Physical activity:    Days per week: Not on file    Minutes per session: Not on file  . Stress: Not on file  Relationships  . Social connections:    Talks on phone: Not on file     Gets together: Not on file    Attends religious service: Not on file    Active member of club or organization: Not on file    Attends meetings of clubs or organizations: Not on file    Relationship status: Not on file  Other Topics Concern  . Not on file  Social History Narrative   [name] - BS, [place]. married - '67. 2 sons - '74, '77  one son is a Nurse, learning disability for [place]; 4 grandchildren. 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 marriage in good health. End-of-life: discussed issues and provided packet with the MOST form and out of  facility order.    Outpatient Encounter Medications as of 06/27/2018  Medication Sig  . atorvastatin (LIPITOR) 80 MG tablet Take 1 tablet (80 mg total) by mouth every evening.  . clobetasol cream (TEMOVATE) 7.16 % Apply 1 application topically daily as needed (for skin irritation).   . Coenzyme Q10 (CO Q-10) 300 MG CAPS Take 300 mg by mouth daily.  . Cyanocobalamin (VITAMIN B-12) 5000 MCG SUBL Place 5,000 mcg under the tongue daily at 2 PM.   . docusate sodium (COLACE) 100 MG capsule Take 1 capsule (100 mg total) by mouth 2 (two) times daily.  [name] dofetilide (TIKOSYN) 500 MCG capsule Take 1 capsule (500 mcg total) by mouth 2 (two) times daily.  . fish oil-omega-3 fatty acids 1000 MG capsule Take 1 g by mouth 2 (two) times daily.   . furosemide (LASIX) 20 MG tablet TAKE 1 TABLET ONCE DAILY.  [name] Glucosamine-Chondroitin (COSAMIN DS PO) Take 1 tablet by mouth 2 (two) times daily.  [name] loratadine (CLARITIN) 10 MG tablet Take 10 mg by mouth daily as needed for allergies.   [name] losartan (COZAAR) 25 MG tablet Take 1 tablet (25 mg total) by mouth daily.  . metoprolol succinate (TOPROL-XL) 25 MG 24 hr tablet Take 3 tablets (75 mg total) by mouth 2 (two) times daily. Take with or immediately following a meal.  . Multiple Vitamins-Minerals (MULTIVITAL-M) TABS Take 1 tablet by mouth daily.   .  potassium chloride SA (K-DUR,KLOR-CON) 20 MEQ tablet TAKE 1 TABLET ONCE DAILY.  . Probiotic Product (PROBIOTIC FORMULA PO) Take 1 capsule by mouth 2 (two) times daily.   [name] warfarin (COUMADIN) 5 MG tablet Take as directed by [facility]mad[facility]  . [DISCONTINUED] enoxaparin (LOVENOX) 150 MG/ML injection Inject 1 mL (150 mg total) into the skin daily. (Patient not taking: Reported on 06/27/2018)  . [DISCONTINUED] ferrous sulfate (FERROUSUL) 325 (65 FE) MG tablet Take 1 tablet (325 mg total) by mouth 3 (three) times daily with meals. (Patient not taking: Reported on 06/27/2018)  . [DISCONTINUED] HYDROcodone-acetaminophen (NORCO) 7.5-325 MG tablet Take 1-2 tablets by mouth every 4 (four) hours as needed for moderate pain. (Patient not taking: Reported on 06/27/2018)  . [DISCONTINUED] methocarbamol (ROBAXIN) 500 MG tablet Take 1 tablet (500 mg total) by mouth every 6 (six) hours as needed for muscle spasms. (Patient not taking: Reported on 06/27/2018)  . [DISCONTINUED] polyethylene glycol (MIRALAX / GLYCOLAX) packet Take 17 g by mouth 2 (two) times daily. (Patient not taking: Reported on 06/27/2018)   No facility-administered encounter medications on file as of 06/27/2018.     Activities of Daily Living In your present state of health, do you have any difficulty performing the following activities: 06/27/2018 04/03/2018  Hearing? N N  Vision? N N  Difficulty concentrating or making decisions? N N  Walking or climbing stairs? N N  Dressing or bathing? N N  Doing errands, shopping? N N  Preparing Food and eating ? N -  Using the Toilet? N -  In the past six months, have you accidently leaked urine? N -  Do you have problems with loss of bowel control? N -  Managing your Medications? N -  Managing your Finances? N -  Housekeeping or managing your Housekeeping? N -  Some recent data might be hidden    Patient Care Team: [name], MD as PCP - General (Internal Medicine)   Assessment:   This is a  routine wellness examination for [name] [name]. Physical assessment deferred to PCP.  Exercise Activities and Dietary recommendations Current Exercise Habits: Home exercise routine, Type of exercise: walking, Time (Minutes): 50, Frequency (Times/Week): 5, Weekly Exercise (Minutes/Week): 250, Exercise limited by: orthopedic condition(s)  Diet (meal preparation, eat out, water intake, caffeinated beverages, dairy products, fruits and vegetables): in general, a "healthy" diet  , well balanced   Reviewed heart healthy diet. Encouraged patient to maintain daily water and healthy fluid intake.  Goals   None     Fall Risk Fall Risk  06/27/2018 05/23/2017 05/22/2017 05/19/2016 01/19/2016  Falls in the past year? 0 No No No No  Number falls in past yr: 0 - - - -    Depression Screen PHQ 2/9 Scores 06/27/2018 05/23/2017 05/22/2017 05/19/2016  PHQ - 2 Score 0 0 0 0    Cognitive Function       Ad8 score reviewed for issues:  Issues making decisions: no  Less interest in hobbies / activities: no  Repeats questions, stories (family complaining): no  Trouble using ordinary gadgets (microwave, computer, phone):no  Forgets the month or year: no  Mismanaging finances: no  Remembering appts: no  Daily problems with thinking and/or memory: no Ad8 score is= 0  Immunization History  Administered Date(s) Administered  . Influenza Whole 12/25/2008, 01/05/2010, 12/30/2011  . Influenza, High Dose Seasonal PF 12/14/2016  . Influenza,inj,Quad PF,6+ Mos 12/19/2013  . Influenza,inj,quad, With Preservative 11/22/2017  . Influenza-Unspecified 12/14/2015  . Pneumococcal Conjugate-13 12/19/2013  . Pneumococcal Polysaccharide-23 09/24/2007, 05/22/2017  . Td 12/25/2008  . Tdap 03/22/2018  . Zoster 09/24/2007   Screening Tests Health Maintenance  Topic Date Due  . INFLUENZA VACCINE  10/06/2018  . COLONOSCOPY  08/14/2022  . TETANUS/TDAP  03/22/2028  . PNA vac Low Risk Adult  Completed        Plan:     Reviewed health maintenance screenings with patient today and relevant education, vaccines, and/or referrals were provided.   Continue to eat heart healthy diet (full of fruits, vegetables, whole grains, lean protein, water--limit salt, fat, and sugar intake) and increase physical activity as tolerated.  Continue doing brain stimulating activities (puzzles, reading, adult coloring books, staying active) to keep memory sharp.   I have personally reviewed and noted the following in the patient's chart:   . Medical and social history . Use of alcohol, tobacco or illicit drugs  . Current medications and supplements . Functional ability and status . Nutritional status . Physical activity . Advanced directives . List of other physicians . Vitals . Screenings to include cognitive, depression, and falls . Referrals and appointments  In addition, I have reviewed and discussed with patient certain preventive protocols, quality metrics, and best practice recommendations. A written personalized care plan for preventive services as well as general preventive health recommendations were provided to patient.     Medical screening examination/treatment/procedure(s) were performed by non-physician practitioner and as supervising physician I was immediately available for consultation/collaboration. I agree with above. Scarlette Calico, MD   Michiel Cowboy, RN  06/27/2018

## 2018-06-27 ENCOUNTER — Ambulatory Visit (INDEPENDENT_AMBULATORY_CARE_PROVIDER_SITE_OTHER): Payer: Medicare Other | Admitting: *Deleted

## 2018-06-27 DIAGNOSIS — Z0000 Encounter for general adult medical examination without abnormal findings: Secondary | ICD-10-CM

## 2018-06-27 NOTE — Patient Instructions (Addendum)
Continue doing brain stimulating activities (puzzles, reading, adult coloring books, staying active) to keep memory sharp.   Continue to eat heart healthy diet (full of fruits, vegetables, whole grains, lean protein, water--limit salt, fat, and sugar intake) and increase physical activity as tolerated.   [name] [name] , Thank you for taking time to come for your Medicare Wellness Visit. I appreciate your ongoing commitment to your health goals. Please review the following plan we discussed and let me know if I can assist you in the future.   These are the goals we discussed: Goals    . Patient Stated     Continue to keep moving and be active. Stay as healthy and as independent as possible.       This is a list of the screening recommended for you and due dates:  Health Maintenance  Topic Date Due  . Flu Shot  10/06/2018  . Colon Cancer Screening  08/14/2022  . Tetanus Vaccine  03/22/2028  . Pneumonia vaccines  Completed   Zoster Vaccine, Recombinant injection What is this medicine? ZOSTER VACCINE (ZOS ter vak SEEN) is used to prevent shingles in adults 76 years old and over. This vaccine is not used to treat shingles or nerve pain from shingles. This medicine may be used for other purposes; ask your health care provider or pharmacist if you have questions. COMMON BRAND NAME(S): [facility] What should I tell my health care provider before I take this medicine? They need to know if you have any of these conditions: -blood disorders or disease -cancer like leukemia or lymphoma -immune system problems or therapy -an unusual or allergic reaction to vaccines, other medications, foods, dyes, or preservatives -pregnant or trying to get pregnant -breast-feeding How should I use this medicine? This vaccine is for injection in a muscle. It is given by a health care professional. Talk to your pediatrician regarding the use of this medicine in children. This medicine is not approved for use in  children. Overdosage: If you think you have taken too much of this medicine contact a poison control center or emergency room at once. NOTE: This medicine is only for you. Do not share this medicine with others. What if I miss a dose? Keep appointments for follow-up (booster) doses as directed. It is important not to miss your dose. Call your doctor or health care professional if you are unable to keep an appointment. What may interact with this medicine? -medicines that suppress your immune system -medicines to treat cancer -steroid medicines like prednisone or cortisone This list may not describe all possible interactions. Give your health care provider a list of all the medicines, herbs, non-prescription drugs, or dietary supplements you use. Also tell them if you smoke, drink alcohol, or use illegal drugs. Some items may interact with your medicine. What should I watch for while using this medicine? Visit your doctor for regular check ups. This vaccine, like all vaccines, may not fully protect everyone. What side effects may I notice from receiving this medicine? Side effects that you should report to your doctor or health care professional as soon as possible: -allergic reactions like skin rash, itching or hives, swelling of the face, lips, or tongue -breathing problems Side effects that usually do not require medical attention (report these to your doctor or health care professional if they continue or are bothersome): -chills -headache -fever -nausea, vomiting -redness, warmth, pain, swelling or itching at site where injected -tiredness This list may not describe all possible side effects.  Call your doctor for medical advice about side effects. You may report side effects to FDA at 1-800-FDA-1088. Where should I keep my medicine? This vaccine is only given in a clinic, pharmacy, doctor's office, or other health care setting and will not be stored at home. NOTE: This sheet is a  summary. It may not cover all possible information. If you have questions about this medicine, talk to your doctor, pharmacist, or health care provider.  2019 Elsevier/Gold Standard (2016-10-03 13:20:30)   Preventive Care 51 Years and Older, Male Preventive care refers to lifestyle choices and visits with your health care provider that can promote health and wellness. What does preventive care include?   A yearly physical exam. This is also called an annual well check.  Dental exams once or twice a year.  Routine eye exams. Ask your health care provider how often you should have your eyes checked.  Personal lifestyle choices, including: ? Daily care of your teeth and gums. ? Regular physical activity. ? Eating a healthy diet. ? Avoiding tobacco and drug use. ? Limiting alcohol use. ? Practicing safe sex. ? Taking low doses of aspirin every day. ? Taking vitamin and mineral supplements as recommended by your health care provider. What happens during an annual well check? The services and screenings done by your health care provider during your annual well check will depend on your age, overall health, lifestyle risk factors, and family history of disease. Counseling Your health care provider may ask you questions about your:  Alcohol use.  Tobacco use.  Drug use.  Emotional well-being.  Home and relationship well-being.  Sexual activity.  Eating habits.  History of falls.  Memory and ability to understand (cognition).  Work and work Statistician. Screening You may have the following tests or measurements:  Height, weight, and BMI.  Blood pressure.  Lipid and cholesterol levels. These may be checked every 5 years, or more frequently if you are over 47 years old.  Skin check.  Lung cancer screening. You may have this screening every year starting at age 74 if you have a 30-pack-year history of smoking and currently smoke or have quit within the past 15  years.  Colorectal cancer screening. All adults should have this screening starting at age 46 and continuing until age 40. You will have tests every 1-10 years, depending on your results and the type of screening test. People at increased risk should start screening at an earlier age. Screening tests may include: ? Guaiac-based fecal occult blood testing. ? Fecal immunochemical test (FIT). ? Stool DNA test. ? Virtual colonoscopy. ? Sigmoidoscopy. During this test, a flexible tube with a tiny camera (sigmoidoscope) is used to examine your rectum and lower colon. The sigmoidoscope is inserted through your anus into your rectum and lower colon. ? Colonoscopy. During this test, a long, thin, flexible tube with a tiny camera (colonoscope) is used to examine your entire colon and rectum.  Prostate cancer screening. Recommendations will vary depending on your family history and other risks.  Hepatitis C blood test.  Hepatitis B blood test.  Sexually transmitted disease (STD) testing.  Diabetes screening. This is done by checking your blood sugar (glucose) after you have not eaten for a while (fasting). You may have this done every 1-3 years.  Abdominal aortic aneurysm (AAA) screening. You may need this if you are a current or former smoker.  Osteoporosis. You may be screened starting at age 56 if you are at high risk. Talk with  your health care provider about your test results, treatment options, and if necessary, the need for more tests. Vaccines Your health care provider may recommend certain vaccines, such as:  Influenza vaccine. This is recommended every year.  Tetanus, diphtheria, and acellular pertussis (Tdap, Td) vaccine. You may need a Td booster every 10 years.  Varicella vaccine. You may need this if you have not been vaccinated.  Zoster vaccine. You may need this after age 49.  Measles, mumps, and rubella (MMR) vaccine. You may need at least one dose of MMR if you were born in  1955 or later. You may also need a second dose.  Pneumococcal 13-valent conjugate (PCV13) vaccine. One dose is recommended after age 71.  Pneumococcal polysaccharide (PPSV23) vaccine. One dose is recommended after age 20.  Meningococcal vaccine. You may need this if you have certain conditions.  Hepatitis A vaccine. You may need this if you have certain conditions or if you travel or work in places where you may be exposed to hepatitis A.  Hepatitis B vaccine. You may need this if you have certain conditions or if you travel or work in places where you may be exposed to hepatitis B.  Haemophilus influenzae type b (Hib) vaccine. You may need this if you have certain risk factors. Talk to your health care provider about which screenings and vaccines you need and how often you need them. This information is not intended to replace advice given to you by your health care provider. Make sure you discuss any questions you have with your health care provider. Document Released: 03/20/2015 Document Revised: 04/13/2017 Document Reviewed: 12/23/2014 Elsevier Interactive Patient Education  2019 [employer].

## 2018-07-18 ENCOUNTER — Telehealth: Payer: Self-pay

## 2018-07-18 NOTE — Telephone Encounter (Signed)
lmom for prescreen/drive thru aware 

## 2018-07-19 ENCOUNTER — Other Ambulatory Visit: Payer: Self-pay

## 2018-07-19 ENCOUNTER — Ambulatory Visit (INDEPENDENT_AMBULATORY_CARE_PROVIDER_SITE_OTHER): Payer: Medicare Other | Admitting: *Deleted

## 2018-07-19 DIAGNOSIS — I4892 Unspecified atrial flutter: Secondary | ICD-10-CM

## 2018-07-19 DIAGNOSIS — I482 Chronic atrial fibrillation, unspecified: Secondary | ICD-10-CM | POA: Diagnosis not present

## 2018-07-19 DIAGNOSIS — Z5181 Encounter for therapeutic drug level monitoring: Secondary | ICD-10-CM | POA: Diagnosis not present

## 2018-07-19 DIAGNOSIS — Z7901 Long term (current) use of anticoagulants: Secondary | ICD-10-CM

## 2018-07-19 LAB — POCT INR: INR: 2.7 (ref 2.0–3.0)

## 2018-07-19 NOTE — Patient Instructions (Signed)
Description   Spoke with pt and instructed to continue on same dosage 1.5 tablets daily except 1 tablet on Tuesdays, Thursdays and Sundays. Recheck INR in 6 weeks. Call and let us know if scheduled for any procedures [phone] or medication changes.

## 2018-07-20 DIAGNOSIS — R972 Elevated prostate specific antigen [PSA]: Secondary | ICD-10-CM | POA: Diagnosis not present

## 2018-07-20 LAB — PSA: PSA: 7.05

## 2018-07-31 ENCOUNTER — Encounter (HOSPITAL_COMMUNITY): Payer: Medicare Other | Admitting: Cardiology

## 2018-08-03 ENCOUNTER — Encounter (HOSPITAL_COMMUNITY): Payer: Self-pay

## 2018-08-03 ENCOUNTER — Ambulatory Visit (HOSPITAL_COMMUNITY)
Admission: RE | Admit: 2018-08-03 | Discharge: 2018-08-03 | Disposition: A | Discharge status: Home / Self Care | Payer: Medicare Other | Source: Ambulatory Visit | Attending: Cardiology | Admitting: Cardiology

## 2018-08-03 ENCOUNTER — Other Ambulatory Visit: Payer: Self-pay

## 2018-08-03 DIAGNOSIS — I5032 Chronic diastolic (congestive) heart failure: Secondary | ICD-10-CM

## 2018-08-03 DIAGNOSIS — Z0000 Encounter for general adult medical examination without abnormal findings: Secondary | ICD-10-CM

## 2018-08-03 DIAGNOSIS — I482 Chronic atrial fibrillation, unspecified: Secondary | ICD-10-CM

## 2018-08-03 DIAGNOSIS — E785 Hyperlipidemia, unspecified: Secondary | ICD-10-CM

## 2018-08-03 DIAGNOSIS — I484 Atypical atrial flutter: Secondary | ICD-10-CM

## 2018-08-03 MED ORDER — WARFARIN SODIUM 5 MG PO TABS: ORAL_TABLET | ORAL | 1 refills | Status: DC

## 2018-08-03 MED ORDER — ATORVASTATIN CALCIUM 80 MG PO TABS: 80.0000 mg | ORAL_TABLET | Freq: Every evening | ORAL | 1 refills | Status: DC

## 2018-08-03 NOTE — Patient Instructions (Addendum)
Lab work will need to be done in 2 weeks with a nurse visit. At this appointment you will have an EKG done.   REFILLED Coumadin and Atorvastatin to [facility]  Please Follow up with Dr. [name] in 3 months with an office visit.

## 2018-08-03 NOTE — Progress Notes (Signed)
Spoke to pt to review after visit summary. Refills sent in. F/u made. No further needs at this time.

## 2018-08-03 NOTE — Progress Notes (Signed)
Heart Failure TeleHealth Note  Due to national recommendations of social distancing due to [place] 19, Audio/video telehealth visit is felt to be most appropriate for this patient at this time.  See MyChart message from today for patient consent regarding telehealth for [facility].  Date:  08/03/2018   ID:  [name] [name], DOB 11-22-1940, MRN [id number]  Location: Home  Provider location: [facility] Type of Visit: Established patient  PCP:  [name], MD  Cardiologist:  Dr. [name]  Chief Complaint: Shortness of breath   History of Present Illness: [name] [name] is a 78 y.o. male who presents via audio/video conferencing for a telehealth visit today.     he denies symptoms worrisome for COVID 19.   Patient has a history of rheumatic fever and AI/[name] s/p bioprosthetic AVR and MVR at the [facility] as well as MAZE procedure all in 2006.  He also has an ascending aortic aneurysm.   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  I started him on dronedarone and coumadin with plan for cardioversion after 4 wks of therapeutic anticoagulation if he did not convert back to NSR on his own.  He went back into NSR spontaneously and later stopped coumadin. Atrial flutter recurred in 3/15 and again resolved spontaneously.  In 6/15, patient was admitted with transient dysarthria (resolved in about 10 minutes).  However, he was found to have evidence for CVA by MRI.  Therefore, he was started on coumadin. He was noted to be in atypical atrial flutter again.  Ranolazine was added to dronedarone.    In 8/15, [name] [name] was seen in consultation at the [facility] clinic.  He had atrial flutter ablation at [facility] in 11/15.    He was seen in followup at [facility] in 2/16.  Ranolazine was stopped.  Echo in 2/16 showed EF 55% with stable bioprosthetic mitral and aortic valves.  CTA chest showed stable 5 cm  ascending aortic aneurysm.  He went back into atypical atrial flutter in 4/16.  Ranolazine was restarted and he was cardioverted back to NSR.    Cardiolite in 12/18 showed no ischemia. He wore a 30 day monitor in 12/18 with no arrhythmia.    He went into atrial flutter again during a respiratory illness in 2/19.  On 04/24/17, he was cardioverted back to NSR.  He thinks that he went into atrial flutter again later on that day. He was back in atrial flutter at followup appointment. He was then admitted for Tikosyn loading in 4/19 and was cardioverted back to NSR after the load.   He had atrial fibrillation again in 6/19 with DCCV.   He was seen at the [facility] in 9/19.  Echo showed normal LV and RV systolic function, bioprosthetic MV with mean gradient up to 9 mmHg, normal bioprosthetic AoV.  CTA chest showed aortic root up to 5.3 cm.   He was back in atrial flutter in 12/19, had DCCV back to NSR.  Echo in 12/19 showed EF 60-65%, normal bioprosthetic aortic valve, bioprosthetic mitral valve with mean gradient 4 mmHg and no [name], severe LAE, aortic root 4.8 cm.   Last visit to [facility] was in 3/20.  Echo at that time showed EF 60-65% with stable bioprosthetic mitral and aortic valves (mean gradient 7 mmHg across MV and 9 mmHg across AoV).  CTA showed 5.0 cm aortic root and ascending aorta, stable.  He has been following his heart rhythm with his Apple Watch.  He thinks that he has been in atrial flutter since around 1/20.  Average HR in 80s.  He walks for 30 minutes daily around a park, notes dyspnea only with inclines.  He does not have as much stamina as in the past.  No chest pain, no orthopnea/PND.  Endurance seems worse in atrial flutter/fibrillation but not markedly worse.  When he was last seen at the [facility], he was offered redo ablation but quoted a <50% chance of long-term maintenance of NSR.    ECG (12/12/11): Atypical atrial flutter versus atrial tachycardia with  rate around 100 bpm, QTc normal.  ECG (12/29/11): NSR, 1st degree AV block PR 242 msec.    ECG (12/13): NSR, 1st degree AV block PR 244 msec    ECG (10/14): NSR, 1st degree AV block ECG (3/15): Atypical atrial flutter with rate 87, inferior T wave inversions ECG (6/15): Atypical atrial flutter with rate 77 ECG (8/15): atypical atrial flutter rate 82 ECG (10/15): NSR, 1st degree AV block ECG (12/15): NSR, 1st degree AV block, QTc 410 msec ECG (5/16): NSR, 1st degree AV block, QTc 419 msec ECG (5/17): NSR, 1st degree AV block 270 msec, QTc 433 ECG (2/18): NSR, 1st degree AVB, nonspecific ST-T changes, QTc 435 msec ECG (11/18): NSR, 1st degree AVB, QTc 424 msec ECG (2/19, personally reviewed): atypical atrial flutter at 87, QTc 442 msec ECG (4/19, personally reviewed): NSR, 1st degree AVB, QTc 453 msec ECG (10/19, personally reviewed): NSR, 1st degree AVB, QTc 448 msec ECG (1/20, personally reviewed): atypical atrial flutter in 90s, QTc 472 msec.   Labs (3/14): LDL 64, TSH normal, K 3.9, creatinine 1.0     Labs (6/15): LDL 54, HDL 48, creatinine 0.95   Labs (9/15): LDL 54, HDL 25 Labs (10/15): K 4.5, creatinine 1.3, AST 45, ALT 64 Labs (4/16): K 4.3, creatinine 1.28, HCT 40.7 Labs (5/17): K 4.1, creatinine 1.16, LDL 66, HDL 47 Labs (2/18): LDL 42, HDL 43, K 4.4, creatinine 1.48 Labs (3/18): hgb 13.8 Labs (2/19): LDL 59 Labs (4/19): K 3.9, creatinine 0.97, Mg 2.0 Labs (6/19): K 4.4, creatinine 0.9 Labs (10/19): LDL 84 Labs (12/19): K 4.2, creatinine 1.13, hgb 13.9 Labs (3/20): K 4.1, creatinine 0.98                                                                                                                                 PMH: 1. Atrial fibrillation: s/p MAZE and LAA ligation in 2006.  No documented atrial fibrillation since operation.  2. Atypical atrial flutter: 10/13, 3/15, and again in 6/15.  Recurrent 4/16 with DCCV.  - Event monitor (12/18): no significant arrhythmia.  -  Recurrence in 2/19: DCCV to NSR but quickly recurred.  - Admitted in 4/19 for Tikosyn loading and cardioverted to NSR.  - DCCV to NSR in 6/19.  - DCCV to NSR in  12/19.  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   - Echo 5/11 with EF 60-65%, normal bioprosthetic aortic valve, normal bioprosthetic mitral valve, moderate LAE.   - Echo 11/12 at [facility] looked ok per report.  - Echo (10/13) with EF 55-60%, mild LVH, normal bioprosthetic aortic and mitral valves, moderate LAE, PA systolic pressure 35 mmHg.   - Echo (6/15) with EF 60%, moderate LVH, normal-appearing bioprosthetic MV and AoV, PA systolic pressure 32 mmHg.   - Echo 9/15 [facility]) with EF 55%, mild LVH, PASP 52 mmHg, severe LAE, bioprosthetic mitral valve with no [name] and mean gradient 11 mmHg, moderate TR, bioprosthetic aortic valve with no AI and mean gradient 9 mmHg, dilated ascending aorta to 5 cm.   - Echo (2/16, [facility]) with EF 55%, stable bioprosthetic aortic and mitral valves (AoV mean gradient 7 mmHg, MV mean gradient 4 mmHg), normal RV size and systolic function.  - Echo (12/16, [facility]) with EF 58%, mild RV dilation with low normal systolic function, PA systolic pressure 34 mmHg, bioprosthetic MV and AoV functioning normally.  - Echo (5/18, [facility]) with EF 60-65%, RV normal size and systolic function, moderate LAE, bioprosthetic MV mean gradient 6 with no [name], bioprosthetic aortic valve with mean gradient 11 and no AI, PASP 41 mmHg.  - Echo (2/19): EF 60-65%, moderate LVH, bioprosthetic aortic and mitral valves functioning normally, PASP 23 mmHg, aortic root 4.5 cm.  - Echo (9/19): EF 56%, normal RV size and systolic function, bioprosthetic MV with mean gradient 9 mmHg, trivial [name], bioprosthetic aortic valve with mean gradient 12 mmHg, PASP 51 mmHg.  - Echo (12/19): EF 60-65%, normal bioprosthetic aortic valve, bioprosthetic  mitral valve with mean gradient 4 mmHg and no [name], severe LAE, aortic root 4.8 cm.  - Echo (3/20, [facility]): EF 60-65%, normal RV, severe LAE, 5.0 cm ascending aorta, bioprosthetic MV with mean gradient 7 mmHg, bioprosthetic aortic valve with mean gradient 9 mmHg.  4. Hyperlipidemia 5. L-spine surgery 6. LHC with no significant coronary disease in 2006 pre-op.  - Cardiolite (12/18): No ischemia.  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  CTA chest 9/15 with dilated sinuses of valsalva to 5.0 cm and ascending aorta 4.9 cm. CTA chest (2/16) with 5.0 cm ascending aorta.  CTA chest (12/16) with 4.9 cm aortic root at SOV, 4.8 cm ascending aorta.  - CT chest (5/18, [facility]): 5.0 cm aortic root, 5.0 cm ascending aorta.  - CTA chest (10/19, [facility]): 5.3 cm aortic root, 5.2 cm ascending aorta.  - CTA chest (3/20, [facility]): 5.0 cm aortic root and ascending aorta 9. Chronic diastolic CHF  Current Outpatient Medications  Medication Sig Dispense Refill   atorvastatin (LIPITOR) 80 MG tablet Take 1 tablet (80 mg total) by mouth every evening. 90 tablet 1   clobetasol cream (TEMOVATE) 7.37 % Apply 1 application topically daily as needed (for skin irritation).      Coenzyme Q10 (CO Q-10) 300 MG CAPS Take 300 mg by mouth daily.     Cyanocobalamin (VITAMIN B-12) 5000 MCG SUBL Place 5,000 mcg under the tongue daily at 2 PM.      docusate sodium (COLACE) 100 MG capsule Take 1 capsule (100 mg total) by mouth 2 (two) times daily. 10 capsule 0   dofetilide (TIKOSYN) 500 MCG capsule Take 1 capsule (500  mcg total) by mouth 2 (two) times daily. 180 capsule 1   fish oil-omega-3 fatty acids 1000 MG capsule Take 1 g by mouth 2 (two) times daily.      furosemide (LASIX) 20 MG tablet TAKE 1 TABLET ONCE DAILY. 90 tablet 0   Glucosamine-Chondroitin (COSAMIN DS PO) Take 1 tablet by mouth 2  (two) times daily.     loratadine (CLARITIN) 10 MG tablet Take 10 mg by mouth daily as needed for allergies.      losartan (COZAAR) 25 MG tablet Take 1 tablet (25 mg total) by mouth daily. 90 tablet 3   metoprolol succinate (TOPROL-XL) 25 MG 24 hr tablet Take 3 tablets (75 mg total) by mouth 2 (two) times daily. Take with or immediately following a meal. 540 tablet 3   Multiple Vitamins-Minerals (MULTIVITAL-M) TABS Take 1 tablet by mouth daily.      potassium chloride SA (K-DUR,KLOR-CON) 20 MEQ tablet TAKE 1 TABLET ONCE DAILY. 90 tablet 0   Probiotic Product (PROBIOTIC FORMULA PO) Take 1 capsule by mouth 2 (two) times daily.      warfarin (COUMADIN) 5 MG tablet Take as directed by [facility]madin Clinic 120 tablet 1   No current facility-administered medications for this encounter.     Allergies:   Naproxen   Social History:  The patient  reports that he has never smoked. He has never used smokeless tobacco. He reports current alcohol use of about 3.0 standard drinks of alcohol per week. He reports that he does not use drugs.   Family History:  The patient's family history includes Cancer in his father; Diabetes in his brother and paternal aunt; Heart attack in his maternal grandfather; Macular degeneration in his mother.   ROS:  Please see the history of present illness.   All other systems are personally reviewed and negative.   Exam:  (Video/Tele Health Call; Exam is subjective and or/visual.) HR 80s General:  Speaks in full sentences. No resp difficulty. Neck: No JVD Lungs: Normal respiratory effort with conversation.  Abdomen: Non-distended per patient report Extremities: Pt denies edema. Neuro: Alert & oriented x 3.   Recent Labs: 04/05/2018: Hemoglobin 10.9; Platelets 183 05/15/2018: BUN 20; Creatinine, Ser 0.98; Magnesium 2.2; Potassium 4.1; Sodium 138  Personally reviewed   Wt Readings from Last 3 Encounters:  05/15/18 94.4 kg (208 lb 3.2 oz)  04/03/18 93.3 kg (205 lb 11  oz)  03/22/18 93.7 kg (206 lb 8 oz)      ASSESSMENT AND PLAN:  1. Atypical atrial flutter: Patient had a MAZE procedure with LAA ligation at the time of his valve replacement.  Since then, he has had occasional episodes of atypical atrial flutter. He had episodes in 10/13 and 3/15 that converted back to NSR on dronedarone and ranolazine. He was noted to be back in atrial flutter in 6/15 in the setting of a CVA.  He had ablation at [facility] in 11/15.  Atrial flutter recurred after ranolazine was stopped and he had DCCV again in 4/16.  Atrial flutter recurred in 2/19, DCCV back to NSR but atrial flutter quickly recurred. Tikosyn was then loaded and he was cardioverted back to NSR in 4/19.  He required DCCV again in 6/19 and in 12/19 (required 4 shocks).  He went back to atrial flutter about 3 days after 12/19 DCCV and appears to have been in atrial flutter since that time. He was offered redo ablation at [facility] in July, but due to severely dilated atria and suspected  significant scarring from prior procedures, he was quoted < 50% chance of NSR maintenance.  - Continue coumadin.  - Continue current Toprol XL. - Continue Tikosyn for now.  He will need to come by the office for ECG and BMET/Mg/CBC.  We talked at length about redo ablation.  I told him that it might be worthwhile to try again if his symptoms are limiting, but if they are manageable, may be best to stop Tikosyn and treat with rate control/anticoagulation rather than another ablation.  He is going to think about it, it depends on how manageable he feels that his symptoms are.   2. 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  Last echo at [facility] in 3/20 showed normal bioprosthetic aortic valve, with stable mean gradient 7 mmHg across the mitral valve.   3. HTN: BP needs good control given ascending aortic aneurysm. BP has been good recently per his report. - Continue losartan and Toprol  XL.  4. Hyperlipidemia: Good lipids in 10/19.      5. CVA: Suspect this was related to atrial flutter (despite LA appendage ligation).  He is now on coumadin. No residual neurologic deficits.  Continue long-term anticoagulation. - If has to stop coumadin in the future, should get Lovenox bridging.  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   - Close followup, may need aortic root/ascending aorta replacement if ascending aorta expands further but not yet.  Getting serial CTs at [facility].    - Continue Toprol XL given some evidence that beta blockers may attenuate thoracic aortic aneurysm growth.  7. Chronic diastolic CHF: Patient is not volume overloaded on exam.   - Continue Lasix 20 mg daily, will be getting BMET.   COVID screen The patient does not have any symptoms that suggest any further testing/ screening at this time.  Social distancing reinforced today.  Patient Risk: After full review of this patients clinical status, I feel that they are at moderate risk for cardiac decompensation at this time.  Relevant cardiac medications were reviewed at length with the patient today. The patient does not have concerns regarding their medications at this time.   Recommended follow-up:  3 months  Today, I have spent 22 minutes with the patient with telehealth technology discussing the above issues .    Signed, Loralie Champagne, MD  08/03/2018  [place] [address] [place] [address] [phone] (office) [phone] (fax)

## 2018-08-17 ENCOUNTER — Ambulatory Visit (HOSPITAL_COMMUNITY)
Admission: RE | Admit: 2018-08-17 | Discharge: 2018-08-17 | Disposition: A | Discharge status: Home / Self Care | Payer: Medicare Other | Source: Ambulatory Visit | Attending: Cardiology | Admitting: Cardiology

## 2018-08-17 ENCOUNTER — Other Ambulatory Visit: Payer: Self-pay

## 2018-08-17 DIAGNOSIS — I4430 Unspecified atrioventricular block: Secondary | ICD-10-CM | POA: Insufficient documentation

## 2018-08-17 DIAGNOSIS — I484 Atypical atrial flutter: Secondary | ICD-10-CM | POA: Diagnosis not present

## 2018-08-17 DIAGNOSIS — R9431 Abnormal electrocardiogram [ECG] [EKG]: Secondary | ICD-10-CM | POA: Diagnosis not present

## 2018-08-17 DIAGNOSIS — E785 Hyperlipidemia, unspecified: Secondary | ICD-10-CM | POA: Diagnosis not present

## 2018-08-17 LAB — BASIC METABOLIC PANEL
Anion gap: 9 (ref 5–15)
BUN: 21 mg/dL (ref 8–23)
CO2: 22 mmol/L (ref 22–32)
Calcium: 9.1 mg/dL (ref 8.9–10.3)
Chloride: 109 mmol/L (ref 98–111)
Creatinine, Ser: 0.91 mg/dL (ref 0.61–1.24)
GFR calc Af Amer: >60 mL/min (ref >60)
GFR calc non Af Amer: >60 mL/min (ref >60)
Glucose, Bld: 164 mg/dL — ABNORMAL HIGH (ref 70–99)
Potassium: 4.1 mmol/L (ref 3.5–5.1)
Sodium: 140 mmol/L (ref 135–145)

## 2018-08-17 LAB — LIPID PANEL
Cholesterol: 168 mg/dL (ref 0–200)
HDL: 38 mg/dL — ABNORMAL LOW (ref >40)
LDL Cholesterol: 103 mg/dL — ABNORMAL HIGH (ref 0–99)
Total CHOL/HDL Ratio: 4.4 RATIO
Triglycerides: 135 mg/dL (ref <150)
VLDL: 27 mg/dL (ref 0–40)

## 2018-08-17 LAB — CBC
HCT: 45.6 % (ref 39.0–52.0)
Hemoglobin: 15.1 g/dL (ref 13.0–17.0)
MCH: 30.6 pg (ref 26.0–34.0)
MCHC: 33.1 g/dL (ref 30.0–36.0)
MCV: 92.5 fL (ref 80.0–100.0)
Platelets: 230 10*3/uL (ref 150–400)
RBC: 4.93 MIL/uL (ref 4.22–5.81)
RDW: 13.8 % (ref 11.5–15.5)
WBC: 8.2 10*3/uL (ref 4.0–10.5)
nRBC: 0 % (ref 0.0–0.2)

## 2018-08-17 LAB — MAGNESIUM: Magnesium: 2.1 mg/dL (ref 1.7–2.4)

## 2018-08-17 NOTE — Addendum Note (Signed)
Encounter addended by: Kerry Dory, CMA on: 08/17/2018 2:17 PM  Actions taken: Order list changed

## 2018-08-21 ENCOUNTER — Telehealth (HOSPITAL_COMMUNITY): Payer: Self-pay | Admitting: *Deleted

## 2018-08-21 NOTE — Telephone Encounter (Signed)
Patient calling for advice regarding continued usage of tikosyn -- he has been in AF for several months - been to the [facility] did not seem to be in the direction for repeat ablation. He is wondering is it beneficial to continue tikosyn at this point. Does he need visit to discuss.

## 2018-08-22 ENCOUNTER — Encounter (HOSPITAL_COMMUNITY): Payer: Self-pay

## 2018-08-23 ENCOUNTER — Telehealth: Payer: Self-pay

## 2018-08-23 ENCOUNTER — Other Ambulatory Visit: Payer: Self-pay

## 2018-08-23 ENCOUNTER — Ambulatory Visit (HOSPITAL_COMMUNITY)
Admission: RE | Admit: 2018-08-23 | Discharge: 2018-08-23 | Disposition: A | Discharge status: Home / Self Care | Payer: Medicare Other | Source: Ambulatory Visit | Attending: Nurse Practitioner | Admitting: Nurse Practitioner

## 2018-08-23 ENCOUNTER — Encounter (HOSPITAL_COMMUNITY): Payer: Self-pay

## 2018-08-23 DIAGNOSIS — I4819 Other persistent atrial fibrillation: Secondary | ICD-10-CM

## 2018-08-23 NOTE — Telephone Encounter (Signed)

## 2018-08-23 NOTE — Telephone Encounter (Signed)
Pt called states he is scheduled for an ablation at the [facility] on 09/12/18.  Pt states he needs weekly INR's prior to procedure.  Scheduled pt for weekly appts in [facility]madin Clinic on 6/19, 6/25 and 7/2.  Prescreened pt for upcoming appt tomorrow.   1. COVID-19 Pre-Screening Questions:  . In the past 7 to 10 days have you had a cough,  shortness of breath, headache, congestion, fever (100 or greater) body aches, chills, sore throat, or sudden loss of taste or sense of smell?  No . Have you been around anyone with known Covid 19.  No . Have you been around anyone who is awaiting Covid 19 test results in the past 7 to 10 days?  No . Have you been around anyone who has been exposed to Covid 19, or has mentioned symptoms of Covid 19 within the past 7 to 10 days?  No    2. Pt advised of visitor restrictions (no visitors allowed except if needed to conduct the visit). Also advised to arrive at appointment time and wear a mask.

## 2018-08-23 NOTE — Progress Notes (Signed)
Electrophysiology TeleHealth Note   Due to national recommendations of social distancing due to [place] 19, Audio/video telehealth visit is felt to be most appropriate for this patient at this time.  See MyChart message/consent below from today for patient consent regarding telehealth for the [facility].    Date:  08/23/2018   ID:  [name] [name], DOB 04-10-1940, MRN [id number]  Location: home  Provider location: [address] [place], [place] [address] Evaluation Performed: Follow up  PCP:  [name], MD  Primary Cardiologist: Dr. [name] Primary Electrophysiologist: Dr. Rayann Heman  CC: Should I continue Tikosyn?   History of Present Illness: [name] [name] is a 78 y.o. male who presents via video conferencing for a telehealth visit today.   The pt has been in persistent afib since March, despite prior ablations and tikosyn. He was seen in [facility] in March. He had CT and echo with normal EF and severely dilated left atrium.His AAA was stable. He was offered ablation but because of severely dilated left atrium hs chances were quoted 50/50 to be able to [facility] SR after ablaton. He is pendng ablton 7/7 but after today will make up hs mnd if he wants to proceed. He is wanting to know if he should continue his Tikosyn.   Today, he denies symptoms of palpitations, chest pain, shortness of breath, orthopnea, PND, lower extremity edema, claudication, dizziness, presyncope, syncope, bleeding, or neurologic sequela. The patient is tolerating medications without difficulties and is otherwise without complaint today.   he denies symptoms of cough, fevers, chills, or new SOB worrisome for COVID 19.      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    Other and unspecified hyperlipidemia    Pre-diabetes    Previous back surgery 1978, jan 2007   Psychosexual dysfunction with inhibited sexual excitement    SOB (shortness of breath)    "with heavy exercise"   Stroke ([place]) 08/2013   "I WAS IN AFIB AND THREW A CLOT, THE EFFECTS WERE TRANSITORY AND DIDNT LAST BUT FOR 30 MINUTES"    Thoracic aortic aneurysm [facility])    Past Surgical History:  Procedure Laterality Date   AORTIC AND MITRAL VALVE REPLACEMENT     09/2004   CARDIOVERSION     3 times from 2004-2006   CARDIOVERSION N/A 09/26/2013   Procedure: CARDIOVERSION;  Surgeon: Larey Dresser, MD;  Location: [place];  Service: Cardiovascular;  Laterality: N/A;   CARDIOVERSION N/A 06/19/2014   Procedure: CARDIOVERSION;  Surgeon: Jerline Pain, MD;  Location: [place];  Service: Cardiovascular;  Laterality: N/A;   CARDIOVERSION N/A 04/24/2017   Procedure: CARDIOVERSION;  Surgeon: Larey Dresser, MD;  Location: [facility] ENDOSCOPY;  Service: Cardiovascular;  Laterality: N/A;   CARDIOVERSION N/A 06/08/2017   Procedure: CARDIOVERSION;  Surgeon: Pixie Casino, MD;  Location: [facility] ENDOSCOPY;  Service: Cardiovascular;  Laterality: N/A;   CARDIOVERSION N/A 08/24/2017   Procedure: CARDIOVERSION;  Surgeon: Skeet Latch, MD;  Location: [place];  Service: Cardiovascular;  Laterality: N/A;   CARDIOVERSION N/A 02/14/2018   Procedure: CARDIOVERSION;  Surgeon: Larey Dresser, MD;  Location: [facility] ENDOSCOPY;  Service: Cardiovascular;  Laterality: N/A;   COLONOSCOPY     laminectomies     10/1975 and in 03/2005   [place] Right 04/03/2018   Procedure: [place];  Surgeon: Paralee Cancel, MD;  Location: [facility];  Service: Orthopedics;   Laterality: Right;  70 mins     Current Outpatient Medications  Medication Sig Dispense Refill   atorvastatin (LIPITOR) 80 MG tablet Take 1 tablet (80 mg total) by mouth every evening. 90 tablet 1   clobetasol cream (TEMOVATE) 7.51 % Apply 1 application topically daily as needed (for skin irritation).      Coenzyme Q10 (CO Q-10) 300 MG CAPS Take 300 mg by mouth daily.     Cyanocobalamin (VITAMIN B-12) 5000 MCG SUBL Place 5,000 mcg under the tongue daily at 2 PM.      docusate sodium (COLACE) 100 MG capsule Take 1 capsule (100 mg total) by mouth 2 (two) times daily. 10 capsule 0   dofetilide (TIKOSYN) 500 MCG capsule Take 1 capsule (500 mcg total) by mouth 2 (two) times daily. 180 capsule 1   fish oil-omega-3 fatty acids 1000 MG capsule Take 1 g by mouth 2 (two) times daily.      furosemide (LASIX) 20 MG tablet TAKE 1 TABLET ONCE DAILY. 90 tablet 0   Glucosamine-Chondroitin (COSAMIN DS PO) Take 1 tablet by mouth 2 (two) times daily.     loratadine (CLARITIN) 10 MG tablet Take 10 mg by mouth daily as needed for allergies.      losartan (COZAAR) 25 MG tablet Take 1 tablet (25 mg total) by mouth daily. 90 tablet 3   metoprolol succinate (TOPROL-XL) 25 MG 24 hr tablet Take 3 tablets (75 mg total) by mouth 2 (two) times daily. Take with or immediately following a meal. 540 tablet 3   Multiple Vitamins-Minerals (MULTIVITAL-M) TABS Take 1 tablet by mouth daily.      potassium chloride SA (K-DUR,KLOR-CON) 20 MEQ tablet TAKE 1 TABLET ONCE DAILY. 90 tablet 0   Probiotic Product (PROBIOTIC FORMULA PO) Take 1 capsule by mouth 2 (two) times daily.      warfarin (COUMADIN) 5 MG tablet Take as directed by [facility] Clinic 120 tablet 1   No current facility-administered medications for this encounter.     Allergies:   Naproxen   Social History:  The patient  reports that he has never smoked. He has never used smokeless tobacco. He reports current alcohol use of about 3.0 standard drinks of  alcohol per week. He reports that he does not use drugs.   Family History:  The patient's  family history includes Cancer in his father; Diabetes in his brother and paternal aunt; Heart attack in his maternal grandfather; Macular degeneration in his mother.    ROS:  Please see the history of present illness.   All other systems are personally reviewed and negative.   Exam: Well appearing, alert and conversant, regular work of breathing,  good skin color  Recent Labs: 08/17/2018: BUN 21; Creatinine, Ser 0.91; Hemoglobin 15.1; Magnesium 2.1; Platelets 230; Potassium 4.1; Sodium 140  personally reviewed    Other studies personally reviewed: ePICRECORDS AND CARE EVERYWHERE REVIEWED    ASSESSMENT AND PLAN:  1.Perssitent atrial  flutter/ atrial fibrillation Pt is trying to decide if he wants  to go through with ablation scheduled 7/7 at [facility] with Dr. Esperanza Heir I would advise if he goes to ablation then continue tikosyn as it will help to keep in SR afterward If he does not go to ablation then  would stop Tikosyn and continue with rate control, as he appears minimally symptomatc He is tolerating afib very well, basically as been in afib since last December and his EF is remaining  Normal Continue warfain   This patients CHA2DS2-VASc Score and unadjusted Ischemic Stroke Rate (% per year) is equal to 4.8 % stroke rate/year from a score of 4  Above score calculated as 1 point each if present [CHF, HTN, DM, Vascular=MI/PAD/Aortic Plaque, Age if 65-74, or Male] Above score calculated as 2 points each if present [Age > 75, or Stroke/TIA/TE]    COVID screen The patient does not have any symptoms that suggest any further testing/ screening at this time.  Social distancing reinforced today.    Follow-up per with [facility], Dr. [name] as scheduled  Current medicines are reviewed at length with the patient today.   The patient has concerns regarding his medicines(tikosyn).   The following changes were made today:  none  Labs/ tests ordered today include: none No orders of the defined types were placed in this encounter.   Patient Risk:  after full review of this patients clinical status, I feel that they are at [facility] at this time.   Today, I have spent  64minutes with the patient with telehealth technology discussing  above  Signed, Roderic Palau NP  08/23/2018 10:53 AM  Afib [facility] [address] [place], [place] [address] [phone]   I hereby voluntarily request, consent and authorize the [facility] and its employed or contracted physicians, physician assistants, nurse practitioners or other licensed health care professionals (the Practitioner), to provide me with telemedicine health care services (the "Services") as deemed necessary by the treating Practitioner. I acknowledge and consent to receive the Services by the Practitioner via telemedicine. I understand that the telemedicine visit will involve communicating with the Practitioner through live audiovisual communication technology and the disclosure of certain medical information by electronic transmission. I acknowledge that I have been given the opportunity to request an in-person assessment or other available alternative prior to the telemedicine visit and am voluntarily participating in the telemedicine visit.   I understand that I have the right to withhold or withdraw my consent to the use of telemedicine in the course of my care at any time, without affecting my right to future care or treatment, and that the Practitioner or I may terminate the telemedicine visit at any time. I understand that I have the right to inspect all information obtained and/or recorded in the course of the telemedicine visit and may receive copies of available information for a reasonable fee.  I understand that some of the potential risks of receiving the Services via  telemedicine include:   Delay or interruption in medical evaluation due to technological equipment failure or disruption;  Information transmitted may not be sufficient (e.g. poor resolution of images) to allow for appropriate medical decision making by the Practitioner; and/or  In rare instances, security protocols could fail, causing a breach of personal health information.   Furthermore, I acknowledge that it is my responsibility to provide information about my medical history, conditions and care that is complete and accurate to the best of my ability. I acknowledge that Practitioner's advice, recommendations, and/or decision  may be based on factors not within their control, such as incomplete or inaccurate data provided by me or distortions of diagnostic images or specimens that may result from electronic transmissions. I understand that the practice of medicine is not an exact science and that Practitioner makes no warranties or guarantees regarding treatment outcomes. I acknowledge that I will receive a copy of this consent concurrently upon execution via email to the email address I last provided but may also request a printed copy by calling the office of the [facility].  I understand that my insurance will be billed for this visit.   I have read or had this consent read to me.  I understand the contents of this consent, which adequately explains the benefits and risks of the Services being provided via telemedicine.  I have been provided ample opportunity to ask questions regarding this consent and the Services and have had my questions answered to my satisfaction.  I give my informed consent for the services to be provided through the use of telemedicine in my medical care  By participating in this telemedicine visit I agree to the above.

## 2018-08-24 ENCOUNTER — Other Ambulatory Visit: Payer: Self-pay

## 2018-08-24 ENCOUNTER — Ambulatory Visit (INDEPENDENT_AMBULATORY_CARE_PROVIDER_SITE_OTHER): Payer: Medicare Other

## 2018-08-24 DIAGNOSIS — I482 Chronic atrial fibrillation, unspecified: Secondary | ICD-10-CM

## 2018-08-24 DIAGNOSIS — I4892 Unspecified atrial flutter: Secondary | ICD-10-CM | POA: Diagnosis not present

## 2018-08-24 DIAGNOSIS — Z5181 Encounter for therapeutic drug level monitoring: Secondary | ICD-10-CM

## 2018-08-24 DIAGNOSIS — Z7901 Long term (current) use of anticoagulants: Secondary | ICD-10-CM | POA: Diagnosis not present

## 2018-08-24 LAB — POCT INR: INR: 3.2 — AB (ref 2.0–3.0)

## 2018-08-24 NOTE — Patient Instructions (Signed)
Description   Take 1 tablet today, then resume same dosage 1.5 tablets daily except 1 tablet on Tuesdays, Thursdays and Sundays. Recheck INR in 1 week. Call and let us know if scheduled for any procedures [phone] or medication changes.

## 2018-08-30 ENCOUNTER — Ambulatory Visit (INDEPENDENT_AMBULATORY_CARE_PROVIDER_SITE_OTHER): Payer: Medicare Other | Admitting: *Deleted

## 2018-08-30 ENCOUNTER — Other Ambulatory Visit: Payer: Self-pay

## 2018-08-30 ENCOUNTER — Encounter: Payer: Self-pay | Admitting: Internal Medicine

## 2018-08-30 ENCOUNTER — Ambulatory Visit (INDEPENDENT_AMBULATORY_CARE_PROVIDER_SITE_OTHER): Payer: Medicare Other | Admitting: Internal Medicine

## 2018-08-30 DIAGNOSIS — I4892 Unspecified atrial flutter: Secondary | ICD-10-CM | POA: Diagnosis not present

## 2018-08-30 DIAGNOSIS — R42 Dizziness and giddiness: Secondary | ICD-10-CM | POA: Diagnosis not present

## 2018-08-30 DIAGNOSIS — I10 Essential (primary) hypertension: Secondary | ICD-10-CM | POA: Diagnosis not present

## 2018-08-30 DIAGNOSIS — Z5181 Encounter for therapeutic drug level monitoring: Secondary | ICD-10-CM | POA: Diagnosis not present

## 2018-08-30 DIAGNOSIS — Z7901 Long term (current) use of anticoagulants: Secondary | ICD-10-CM

## 2018-08-30 DIAGNOSIS — I482 Chronic atrial fibrillation, unspecified: Secondary | ICD-10-CM

## 2018-08-30 LAB — POCT INR: INR: 3.1 — AB (ref 2.0–3.0)

## 2018-08-30 NOTE — Assessment & Plan Note (Signed)
Mild at this time, given epley maneuver to use. Should not take meclizine at this time given mild nature to his symptoms.

## 2018-08-30 NOTE — Progress Notes (Signed)
Virtual Visit via Video Note  I connected with [name] [name] on 08/30/18 at  3:20 PM EDT by a video enabled telemedicine application and verified that I am speaking with the correct person using two identifiers.  The patient and the provider were at separate locations throughout the entire encounter.   I discussed the limitations of evaluation and management by telemedicine and the availability of in person appointments. The patient expressed understanding and agreed to proceed.  History of Present Illness: The patient is a 78 y.o. man with visit for room spinning. Started about 2 weeks ago when he turned his head while getting out of bed in the morning. This cleared within a few minutes. He has had this sensation again with turning his head a certain way since that time. Overall it is improving. Denies lightheadedness or pre-syncope. Denies blood in stool or bruising. Does have A fib and has been in persistent A fib since December. He is supposed to get ablation at [facility] in a couple of weeks and wants to make sure he is still okay for that. Denies fevers or chills. Denies SOB. Denies cough. Has no other symptoms including nausea. Denies ear pain or sinus pain or pressure. Overall it is improving and now is happening rarely and lasting a minute or less. Has tried nothing  Observations/Objective: Appearance: normal, breathing appears normal, casual grooming, abdomen does not appear distended, throat normal, memory normal, mental status is A and O times 3  Assessment and Plan: See problem oriented charting  Follow Up Instructions: given epley maneuver instructions, not candidate for meclizine at this time, okay to proceed with ablation if desired without delay  I discussed the assessment and treatment plan with the patient. The patient was provided an opportunity to ask questions and all were answered. The patient agreed with the plan and demonstrated an understanding of the instructions.    The patient was advised to call back or seek an in-person evaluation if the symptoms worsen or if the condition fails to improve as anticipated.  Hoyt Koch, MD

## 2018-08-30 NOTE — Assessment & Plan Note (Signed)
BP running stable at home and at goal. HR not too low. Taking losartan, tikosyn, metoprolol and lasix. No adjustment at this time.

## 2018-08-30 NOTE — Patient Instructions (Signed)
Description   Take 1/2 tablet today, then resume same dosage 1.5 tablets daily except 1 tablet on Tuesdays, Thursdays and Sundays. Recheck INR in 1 week. Call and let us know if scheduled for any procedures [phone] or medication changes.

## 2018-08-30 NOTE — Assessment & Plan Note (Signed)
Discussed with patient that this does not affect his upcoming ablation procedure and he can proceed with this. Continue meds as per cardiology including tikosyn, metoprolol, warfarin. INR at goal on last check.

## 2018-09-06 ENCOUNTER — Ambulatory Visit (INDEPENDENT_AMBULATORY_CARE_PROVIDER_SITE_OTHER): Payer: Medicare Other | Admitting: *Deleted

## 2018-09-06 ENCOUNTER — Other Ambulatory Visit: Payer: Self-pay

## 2018-09-06 DIAGNOSIS — I4892 Unspecified atrial flutter: Secondary | ICD-10-CM | POA: Diagnosis not present

## 2018-09-06 DIAGNOSIS — Z5181 Encounter for therapeutic drug level monitoring: Secondary | ICD-10-CM | POA: Diagnosis not present

## 2018-09-06 DIAGNOSIS — I482 Chronic atrial fibrillation, unspecified: Secondary | ICD-10-CM

## 2018-09-06 LAB — POCT INR: INR: 1.9 — AB (ref 2.0–3.0)

## 2018-09-06 NOTE — Patient Instructions (Signed)
Description   Take 1.5 tablets today, then resume same dosage 1.5 tablets daily except for 1 tablet on Sunday, Tuesday and Thursday. Recheck INR in 2 weeks. Call and let us know if scheduled for any procedures [phone] or medication changes.

## 2018-09-10 DIAGNOSIS — I480 Paroxysmal atrial fibrillation: Secondary | ICD-10-CM | POA: Diagnosis not present

## 2018-09-11 DIAGNOSIS — I4819 Other persistent atrial fibrillation: Secondary | ICD-10-CM | POA: Diagnosis not present

## 2018-09-11 DIAGNOSIS — I459 Conduction disorder, unspecified: Secondary | ICD-10-CM | POA: Diagnosis not present

## 2018-09-11 DIAGNOSIS — I484 Atypical atrial flutter: Secondary | ICD-10-CM | POA: Diagnosis not present

## 2018-09-11 DIAGNOSIS — I517 Cardiomegaly: Secondary | ICD-10-CM | POA: Diagnosis not present

## 2018-09-11 DIAGNOSIS — R9431 Abnormal electrocardiogram [ECG] [EKG]: Secondary | ICD-10-CM | POA: Diagnosis not present

## 2018-09-11 DIAGNOSIS — I441 Atrioventricular block, second degree: Secondary | ICD-10-CM | POA: Diagnosis not present

## 2018-09-11 DIAGNOSIS — I712 Thoracic aortic aneurysm, without rupture: Secondary | ICD-10-CM | POA: Diagnosis not present

## 2018-09-11 DIAGNOSIS — Z952 Presence of prosthetic heart valve: Secondary | ICD-10-CM | POA: Diagnosis not present

## 2018-09-11 DIAGNOSIS — I491 Atrial premature depolarization: Secondary | ICD-10-CM | POA: Diagnosis not present

## 2018-09-11 DIAGNOSIS — Z8673 Personal history of transient ischemic attack (TIA), and cerebral infarction without residual deficits: Secondary | ICD-10-CM | POA: Diagnosis not present

## 2018-09-12 DIAGNOSIS — I484 Atypical atrial flutter: Secondary | ICD-10-CM | POA: Diagnosis not present

## 2018-09-12 DIAGNOSIS — R9431 Abnormal electrocardiogram [ECG] [EKG]: Secondary | ICD-10-CM | POA: Diagnosis not present

## 2018-09-12 DIAGNOSIS — I4892 Unspecified atrial flutter: Secondary | ICD-10-CM | POA: Diagnosis not present

## 2018-09-12 DIAGNOSIS — I4811 Longstanding persistent atrial fibrillation: Secondary | ICD-10-CM | POA: Diagnosis not present

## 2018-09-13 ENCOUNTER — Telehealth: Payer: Self-pay

## 2018-09-13 DIAGNOSIS — I440 Atrioventricular block, first degree: Secondary | ICD-10-CM | POA: Diagnosis not present

## 2018-09-13 DIAGNOSIS — R339 Retention of urine, unspecified: Secondary | ICD-10-CM | POA: Diagnosis not present

## 2018-09-13 DIAGNOSIS — I480 Paroxysmal atrial fibrillation: Secondary | ICD-10-CM | POA: Diagnosis not present

## 2018-09-13 DIAGNOSIS — I484 Atypical atrial flutter: Secondary | ICD-10-CM | POA: Diagnosis not present

## 2018-09-13 DIAGNOSIS — R9431 Abnormal electrocardiogram [ECG] [EKG]: Secondary | ICD-10-CM | POA: Diagnosis not present

## 2018-09-13 DIAGNOSIS — N179 Acute kidney failure, unspecified: Secondary | ICD-10-CM | POA: Diagnosis not present

## 2018-09-13 NOTE — Telephone Encounter (Signed)
lmom for prescreen  

## 2018-09-14 DIAGNOSIS — E785 Hyperlipidemia, unspecified: Secondary | ICD-10-CM | POA: Diagnosis present

## 2018-09-14 DIAGNOSIS — Z20828 Contact with and (suspected) exposure to other viral communicable diseases: Secondary | ICD-10-CM | POA: Diagnosis present

## 2018-09-14 DIAGNOSIS — Z8673 Personal history of transient ischemic attack (TIA), and cerebral infarction without residual deficits: Secondary | ICD-10-CM | POA: Diagnosis not present

## 2018-09-14 DIAGNOSIS — R339 Retention of urine, unspecified: Secondary | ICD-10-CM | POA: Diagnosis not present

## 2018-09-14 DIAGNOSIS — I712 Thoracic aortic aneurysm, without rupture: Secondary | ICD-10-CM | POA: Diagnosis present

## 2018-09-14 DIAGNOSIS — Z96641 Presence of right artificial hip joint: Secondary | ICD-10-CM | POA: Diagnosis present

## 2018-09-14 DIAGNOSIS — I4819 Other persistent atrial fibrillation: Secondary | ICD-10-CM | POA: Diagnosis present

## 2018-09-14 DIAGNOSIS — Z7901 Long term (current) use of anticoagulants: Secondary | ICD-10-CM | POA: Diagnosis not present

## 2018-09-14 DIAGNOSIS — I484 Atypical atrial flutter: Secondary | ICD-10-CM | POA: Diagnosis present

## 2018-09-14 DIAGNOSIS — R338 Other retention of urine: Secondary | ICD-10-CM | POA: Diagnosis not present

## 2018-09-14 DIAGNOSIS — N179 Acute kidney failure, unspecified: Secondary | ICD-10-CM | POA: Diagnosis not present

## 2018-09-14 DIAGNOSIS — N139 Obstructive and reflux uropathy, unspecified: Secondary | ICD-10-CM | POA: Diagnosis present

## 2018-09-14 DIAGNOSIS — Z953 Presence of xenogenic heart valve: Secondary | ICD-10-CM | POA: Diagnosis not present

## 2018-09-14 DIAGNOSIS — N138 Other obstructive and reflux uropathy: Secondary | ICD-10-CM | POA: Diagnosis not present

## 2018-09-14 DIAGNOSIS — I441 Atrioventricular block, second degree: Secondary | ICD-10-CM | POA: Diagnosis not present

## 2018-09-14 DIAGNOSIS — I491 Atrial premature depolarization: Secondary | ICD-10-CM | POA: Diagnosis not present

## 2018-09-14 DIAGNOSIS — N401 Enlarged prostate with lower urinary tract symptoms: Secondary | ICD-10-CM | POA: Diagnosis not present

## 2018-09-14 DIAGNOSIS — I480 Paroxysmal atrial fibrillation: Secondary | ICD-10-CM | POA: Diagnosis not present

## 2018-09-15 MED ORDER — ATORVASTATIN CALCIUM 80 MG PO TABS
80.00 | ORAL_TABLET | ORAL | Status: DC
Start: 2018-09-15 — End: 2018-09-15

## 2018-09-15 MED ORDER — GENERIC EXTERNAL MEDICATION
75.00 | Status: DC
Start: 2018-09-14 — End: 2018-09-15

## 2018-09-15 MED ORDER — DOFETILIDE 500 MCG PO CAPS
500.00 | ORAL_CAPSULE | ORAL | Status: DC
Start: 2018-09-14 — End: 2018-09-15

## 2018-09-15 MED ORDER — TAMSULOSIN HCL 0.4 MG PO CAPS
0.40 | ORAL_CAPSULE | ORAL | Status: DC
Start: ? — End: 2018-09-15

## 2018-09-15 MED ORDER — GENERIC EXTERNAL MEDICATION
Status: DC
Start: ? — End: 2018-09-15

## 2018-09-15 MED ORDER — ACETAMINOPHEN 325 MG PO TABS
650.00 | ORAL_TABLET | ORAL | Status: DC
Start: ? — End: 2018-09-15

## 2018-09-17 DIAGNOSIS — R972 Elevated prostate specific antigen [PSA]: Secondary | ICD-10-CM | POA: Diagnosis not present

## 2018-09-17 DIAGNOSIS — R3914 Feeling of incomplete bladder emptying: Secondary | ICD-10-CM | POA: Diagnosis not present

## 2018-09-17 DIAGNOSIS — N401 Enlarged prostate with lower urinary tract symptoms: Secondary | ICD-10-CM | POA: Diagnosis not present

## 2018-09-17 NOTE — Telephone Encounter (Signed)
1. COVID-19 Pre-Screening Questions:  . In the past 7 to 10 days have you had a cough,  shortness of breath, headache, congestion, fever (100 or greater) body aches, chills, sore throat, or sudden loss of taste or sense of smell?  No . Have you been around anyone with known Covid 19.  No . Have you been around anyone who is awaiting Covid 19 test results in the past 7 to 10 days?  No . Have you been around anyone who has been exposed to Covid 19, or has mentioned symptoms of Covid 19 within the past 7 to 10 days?  No Pt Covid tested 09/10/18-Neg prior to ablation at [facility] clinic    2. Pt advised of visitor restrictions (no visitors allowed except if needed to conduct the visit). Also advised to arrive at appointment time and wear a mask.

## 2018-09-20 ENCOUNTER — Ambulatory Visit (INDEPENDENT_AMBULATORY_CARE_PROVIDER_SITE_OTHER): Payer: Medicare Other | Admitting: *Deleted

## 2018-09-20 ENCOUNTER — Other Ambulatory Visit: Payer: Self-pay

## 2018-09-20 DIAGNOSIS — I482 Chronic atrial fibrillation, unspecified: Secondary | ICD-10-CM | POA: Diagnosis not present

## 2018-09-20 DIAGNOSIS — I4892 Unspecified atrial flutter: Secondary | ICD-10-CM | POA: Diagnosis not present

## 2018-09-20 DIAGNOSIS — Z5181 Encounter for therapeutic drug level monitoring: Secondary | ICD-10-CM | POA: Diagnosis not present

## 2018-09-20 LAB — POCT INR: INR: 1.6 — AB (ref 2.0–3.0)

## 2018-09-20 NOTE — Patient Instructions (Signed)
Description   Take 1.5 tablets today and 2 tablets tomorrow, then resume same dosage 1.5 tablets daily except for 1 tablet on Sunday, Tuesday and Thursday. Recheck INR in 2 weeks. Call and let us know if scheduled for any procedures [phone] or medication changes.

## 2018-09-24 ENCOUNTER — Other Ambulatory Visit (HOSPITAL_COMMUNITY): Payer: Self-pay | Admitting: Cardiology

## 2018-09-24 DIAGNOSIS — R310 Gross hematuria: Secondary | ICD-10-CM | POA: Diagnosis not present

## 2018-09-24 DIAGNOSIS — R338 Other retention of urine: Secondary | ICD-10-CM | POA: Diagnosis not present

## 2018-09-24 DIAGNOSIS — R3914 Feeling of incomplete bladder emptying: Secondary | ICD-10-CM | POA: Diagnosis not present

## 2018-09-24 DIAGNOSIS — N401 Enlarged prostate with lower urinary tract symptoms: Secondary | ICD-10-CM | POA: Diagnosis not present

## 2018-10-02 ENCOUNTER — Telehealth: Payer: Self-pay | Admitting: Pharmacist

## 2018-10-02 NOTE — Telephone Encounter (Signed)

## 2018-10-04 ENCOUNTER — Other Ambulatory Visit: Payer: Self-pay

## 2018-10-04 ENCOUNTER — Ambulatory Visit (INDEPENDENT_AMBULATORY_CARE_PROVIDER_SITE_OTHER): Payer: Medicare Other | Admitting: *Deleted

## 2018-10-04 DIAGNOSIS — I482 Chronic atrial fibrillation, unspecified: Secondary | ICD-10-CM | POA: Diagnosis not present

## 2018-10-04 DIAGNOSIS — Z5181 Encounter for therapeutic drug level monitoring: Secondary | ICD-10-CM | POA: Diagnosis not present

## 2018-10-04 DIAGNOSIS — I4892 Unspecified atrial flutter: Secondary | ICD-10-CM

## 2018-10-04 DIAGNOSIS — Z7901 Long term (current) use of anticoagulants: Secondary | ICD-10-CM

## 2018-10-04 LAB — POCT INR: INR: 2.1 (ref 2.0–3.0)

## 2018-10-04 NOTE — Patient Instructions (Signed)
Description   Continue taking 1.5 tablets daily except for 1 tablet on Sunday, Tuesday and Thursday. Recheck INR in 3 weeks. Call and let us know if scheduled for any procedures [phone] or medication changes.

## 2018-10-09 ENCOUNTER — Other Ambulatory Visit (HOSPITAL_COMMUNITY): Payer: Self-pay | Admitting: Cardiology

## 2018-10-15 DIAGNOSIS — N401 Enlarged prostate with lower urinary tract symptoms: Secondary | ICD-10-CM | POA: Diagnosis not present

## 2018-10-15 DIAGNOSIS — I480 Paroxysmal atrial fibrillation: Secondary | ICD-10-CM | POA: Diagnosis not present

## 2018-10-15 DIAGNOSIS — Z950 Presence of cardiac pacemaker: Secondary | ICD-10-CM | POA: Diagnosis not present

## 2018-10-19 DIAGNOSIS — R3914 Feeling of incomplete bladder emptying: Secondary | ICD-10-CM | POA: Diagnosis not present

## 2018-10-19 DIAGNOSIS — N401 Enlarged prostate with lower urinary tract symptoms: Secondary | ICD-10-CM | POA: Diagnosis not present

## 2018-10-25 ENCOUNTER — Ambulatory Visit (INDEPENDENT_AMBULATORY_CARE_PROVIDER_SITE_OTHER): Payer: Medicare Other | Admitting: Pharmacist

## 2018-10-25 ENCOUNTER — Other Ambulatory Visit: Payer: Self-pay

## 2018-10-25 DIAGNOSIS — I4892 Unspecified atrial flutter: Secondary | ICD-10-CM | POA: Diagnosis not present

## 2018-10-25 DIAGNOSIS — Z5181 Encounter for therapeutic drug level monitoring: Secondary | ICD-10-CM

## 2018-10-25 DIAGNOSIS — Z7901 Long term (current) use of anticoagulants: Secondary | ICD-10-CM | POA: Diagnosis not present

## 2018-10-25 DIAGNOSIS — I482 Chronic atrial fibrillation, unspecified: Secondary | ICD-10-CM | POA: Diagnosis not present

## 2018-10-25 LAB — POCT INR: INR: 2.4 (ref 2.0–3.0)

## 2018-10-25 NOTE — Patient Instructions (Signed)
Continue taking 1.5 tablets daily except for 1 tablet on Sunday, Tuesday and Thursday. Recheck INR in 4 weeks. Call and let us know if scheduled for any procedures [phone] or medication changes.

## 2018-11-02 DIAGNOSIS — N3000 Acute cystitis without hematuria: Secondary | ICD-10-CM | POA: Diagnosis not present

## 2018-11-06 ENCOUNTER — Other Ambulatory Visit: Payer: Self-pay

## 2018-11-06 ENCOUNTER — Ambulatory Visit (HOSPITAL_COMMUNITY)
Admission: RE | Admit: 2018-11-06 | Discharge: 2018-11-06 | Disposition: A | Discharge status: Home / Self Care | Payer: Medicare Other | Source: Ambulatory Visit | Attending: Cardiology | Admitting: Cardiology

## 2018-11-06 ENCOUNTER — Encounter (HOSPITAL_COMMUNITY): Payer: Self-pay | Admitting: Cardiology

## 2018-11-06 VITALS — BP 120/80 | HR 75 | Wt 206.8 lb

## 2018-11-06 DIAGNOSIS — I080 Rheumatic disorders of both mitral and aortic valves: Secondary | ICD-10-CM | POA: Insufficient documentation

## 2018-11-06 DIAGNOSIS — Z833 Family history of diabetes mellitus: Secondary | ICD-10-CM | POA: Diagnosis not present

## 2018-11-06 DIAGNOSIS — Z809 Family history of malignant neoplasm, unspecified: Secondary | ICD-10-CM | POA: Insufficient documentation

## 2018-11-06 DIAGNOSIS — Z79899 Other long term (current) drug therapy: Secondary | ICD-10-CM | POA: Diagnosis not present

## 2018-11-06 DIAGNOSIS — I484 Atypical atrial flutter: Secondary | ICD-10-CM | POA: Insufficient documentation

## 2018-11-06 DIAGNOSIS — I059 Rheumatic mitral valve disease, unspecified: Secondary | ICD-10-CM

## 2018-11-06 DIAGNOSIS — I110 Hypertensive heart disease with heart failure: Secondary | ICD-10-CM | POA: Insufficient documentation

## 2018-11-06 DIAGNOSIS — Z8673 Personal history of transient ischemic attack (TIA), and cerebral infarction without residual deficits: Secondary | ICD-10-CM | POA: Insufficient documentation

## 2018-11-06 DIAGNOSIS — I712 Thoracic aortic aneurysm, without rupture: Secondary | ICD-10-CM | POA: Diagnosis not present

## 2018-11-06 DIAGNOSIS — Z953 Presence of xenogenic heart valve: Secondary | ICD-10-CM | POA: Diagnosis not present

## 2018-11-06 DIAGNOSIS — Z8249 Family history of ischemic heart disease and other diseases of the circulatory system: Secondary | ICD-10-CM | POA: Insufficient documentation

## 2018-11-06 DIAGNOSIS — I5032 Chronic diastolic (congestive) heart failure: Secondary | ICD-10-CM

## 2018-11-06 DIAGNOSIS — Z886 Allergy status to analgesic agent status: Secondary | ICD-10-CM | POA: Diagnosis not present

## 2018-11-06 DIAGNOSIS — E785 Hyperlipidemia, unspecified: Secondary | ICD-10-CM | POA: Diagnosis not present

## 2018-11-06 DIAGNOSIS — I4892 Unspecified atrial flutter: Secondary | ICD-10-CM | POA: Diagnosis not present

## 2018-11-06 DIAGNOSIS — Z7901 Long term (current) use of anticoagulants: Secondary | ICD-10-CM | POA: Diagnosis not present

## 2018-11-06 LAB — BASIC METABOLIC PANEL
Anion gap: 9 (ref 5–15)
BUN: 17 mg/dL (ref 8–23)
CO2: 23 mmol/L (ref 22–32)
Calcium: 8.9 mg/dL (ref 8.9–10.3)
Chloride: 106 mmol/L (ref 98–111)
Creatinine, Ser: 0.95 mg/dL (ref 0.61–1.24)
GFR calc Af Amer: >60 mL/min (ref >60)
GFR calc non Af Amer: >60 mL/min (ref >60)
Glucose, Bld: 168 mg/dL — ABNORMAL HIGH (ref 70–99)
Potassium: 4 mmol/L (ref 3.5–5.1)
Sodium: 138 mmol/L (ref 135–145)

## 2018-11-06 LAB — CBC
HCT: 39.2 % (ref 39.0–52.0)
Hemoglobin: 12.7 g/dL — ABNORMAL LOW (ref 13.0–17.0)
MCH: 31.3 pg (ref 26.0–34.0)
MCHC: 32.4 g/dL (ref 30.0–36.0)
MCV: 96.6 fL (ref 80.0–100.0)
Platelets: 282 10*3/uL (ref 150–400)
RBC: 4.06 MIL/uL — ABNORMAL LOW (ref 4.22–5.81)
RDW: 13.7 % (ref 11.5–15.5)
WBC: 8.9 10*3/uL (ref 4.0–10.5)
nRBC: 0 % (ref 0.0–0.2)

## 2018-11-06 LAB — MAGNESIUM: Magnesium: 2.2 mg/dL (ref 1.7–2.4)

## 2018-11-06 NOTE — Patient Instructions (Addendum)
Labs done today  Your physician recommends that you schedule a follow-up appointment in: 4 months  At the [facility], you and your health needs are our priority. As part of our continuing mission to provide you with exceptional heart care, we have created designated Provider Care Teams. These Care Teams include your primary Cardiologist (physician) and Advanced Practice Providers (APPs- Physician Assistants and Nurse Practitioners) who all work together to provide you with the care you need, when you need it.   You may see any of the following providers on your designated Care Team at your next follow up: [name] Dr Glori Bickers . Dr Loralie Champagne . Darrick Grinder, NP   Please be sure to bring in all your medications bottles to every appointment.

## 2018-11-07 NOTE — Progress Notes (Signed)
Date:  11/07/2018   ID:  [name] [name], DOB 04/06/1940, MRN [id number]    Provider location: [place] [facility] Type of Visit: Established patient  PCP:  [name], MD  Cardiologist:  Dr. [name]   History of Present Illness: [name] [name] is a 78 y.o. 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  He also has an ascending aortic aneurysm.   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  I started him on dronedarone and coumadin with plan for cardioversion after 4 wks of therapeutic anticoagulation if he did not convert back to NSR on his own.  He went back into NSR spontaneously and later stopped coumadin. Atrial flutter recurred in 3/15 and again resolved spontaneously.  In 6/15, patient was admitted with transient dysarthria (resolved in about 10 minutes).  However, he was found to have evidence for CVA by MRI.  Therefore, he was started on coumadin. He was noted to be in atypical atrial flutter again.  Ranolazine was added to dronedarone.    In 8/15, Mr [name] was seen in consultation at the [facility] clinic.  He had atrial flutter ablation at [facility] in 11/15.    He was seen in followup at [facility] in 2/16.  Ranolazine was stopped.  Echo in 2/16 showed EF 55% with stable bioprosthetic mitral and aortic valves.  CTA chest showed stable 5 cm ascending aortic aneurysm.  He went back into atypical atrial flutter in 4/16.  Ranolazine was restarted and he was cardioverted back to NSR.    Cardiolite in 12/18 showed no ischemia. He wore a 30 day monitor in 12/18 with no arrhythmia.    He went into atrial flutter again during a respiratory illness in 2/19.  On 04/24/17, he was cardioverted back to NSR.  He thinks that he went into atrial flutter again later on that day. He was back in atrial flutter at followup  appointment. He was then admitted for Tikosyn loading in 4/19 and was cardioverted back to NSR after the load.   He had atrial fibrillation again in 6/19 with DCCV.   He was seen at the [facility] in 9/19.  Echo showed normal LV and RV systolic function, bioprosthetic MV with mean gradient up to 9 mmHg, normal bioprosthetic AoV.  CTA chest showed aortic root up to 5.3 cm.   He was back in atrial flutter in 12/19, had DCCV back to NSR.  Echo in 12/19 showed EF 60-65%, normal bioprosthetic aortic valve, bioprosthetic mitral valve with mean gradient 4 mmHg and no MR, severe LAE, aortic root 4.8 cm.   He went to the [facility] in 3/20.  Echo at that time showed EF 60-65% with stable bioprosthetic mitral and aortic valves (mean gradient 7 mmHg across MV and 9 mmHg across AoV).  CTA showed 5.0 cm aortic root and ascending aorta, stable.  He returned to the [facility] in 7/20.  He had atrial fibrillation/flutter ablation again and thinks he has been in NSR since that time.  CTA chest showed 5.1 cm ascending thoracic aorta.   He returns for followup of diastolic CHF, atrial fibrillation, and bioprosthetic AVR and MVR.  He is in  NSR today, and he feels like he has been in NSR since 7/20 ablation.  No dyspnea walking on  flat ground.  Hip is getting better over time post-surgery.  No BRBPR/melena.  Some generalized fatigue.     ECG (12/12/11): Atypical atrial flutter versus atrial tachycardia with rate around 100 bpm, QTc normal.  ECG (12/29/11): NSR, 1st degree AV block PR 242 msec.    ECG (12/13): NSR, 1st degree AV block PR 244 msec    ECG (10/14): NSR, 1st degree AV block ECG (3/15): Atypical atrial flutter with rate 87, inferior T wave inversions ECG (6/15): Atypical atrial flutter with rate 77 ECG (8/15): atypical atrial flutter rate 82 ECG (10/15): NSR, 1st degree AV block ECG (12/15): NSR, 1st degree AV block, QTc 410 msec ECG (5/16): NSR, 1st degree AV block, QTc 419 msec  ECG (5/17): NSR, 1st degree AV block 270 msec, QTc 433 ECG (2/18): NSR, 1st degree AVB, nonspecific ST-T changes, QTc 435 msec ECG (11/18): NSR, 1st degree AVB, QTc 424 msec ECG (2/19, personally reviewed): atypical atrial flutter at 87, QTc 442 msec ECG (4/19, personally reviewed): NSR, 1st degree AVB, QTc 453 msec ECG (10/19, personally reviewed): NSR, 1st degree AVB, QTc 448 msec ECG (1/20, personally reviewed): atypical atrial flutter in 90s, QTc 472 msec.  ECG (8/20): NSR, 1st degree AVB, QTc 448 msec  Labs (3/14): LDL 64, TSH normal, K 3.9, creatinine 1.0     Labs (6/15): LDL 54, HDL 48, creatinine 0.95   Labs (9/15): LDL 54, HDL 25 Labs (10/15): K 4.5, creatinine 1.3, AST 45, ALT 64 Labs (4/16): K 4.3, creatinine 1.28, HCT 40.7 Labs (5/17): K 4.1, creatinine 1.16, LDL 66, HDL 47 Labs (2/18): LDL 42, HDL 43, K 4.4, creatinine 1.48 Labs (3/18): hgb 13.8 Labs (2/19): LDL 59 Labs (4/19): K 3.9, creatinine 0.97, Mg 2.0 Labs (6/19): K 4.4, creatinine 0.9 Labs (10/19): LDL 84 Labs (12/19): K 4.2, creatinine 1.13, hgb 13.9 Labs (3/20): K 4.1, creatinine 0.98 Labs (6/20): K 4.1, creatinine 0.91, LDL 103                                                                                                                                 PMH: 1. Atrial fibrillation: s/p MAZE and LAA ligation in 2006.  No documented atrial fibrillation since operation.  2. Atypical atrial flutter: 10/13, 3/15, and again in 6/15.  Recurrent 4/16 with DCCV.  - Event monitor (12/18): no significant arrhythmia.  - Recurrence in 2/19: DCCV to NSR but quickly recurred.  - Admitted in 4/19 for Tikosyn loading and cardioverted to NSR.  - DCCV to NSR in 6/19.  - DCCV to NSR in 12/19. - 7/20 redo atrial flutter/fibrillation ablation.   3. 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   - Echo 5/11 with EF 60-65%, normal bioprosthetic aortic valve, normal  bioprosthetic mitral valve, moderate LAE.   - Echo 11/12 at [facility] looked ok per report.  - Echo (10/13) with EF 55-60%,  mild LVH, normal bioprosthetic aortic and mitral valves, moderate LAE, PA systolic pressure 35 mmHg.   - Echo (6/15) with EF 60%, moderate LVH, normal-appearing bioprosthetic MV and AoV, PA systolic pressure 32 mmHg.   - Echo 9/15 [facility]) with EF 55%, mild LVH, PASP 52 mmHg, severe LAE, bioprosthetic mitral valve with no MR and mean gradient 11 mmHg, moderate TR, bioprosthetic aortic valve with no AI and mean gradient 9 mmHg, dilated ascending aorta to 5 cm.   - Echo (2/16, [facility]) with EF 55%, stable bioprosthetic aortic and mitral valves (AoV mean gradient 7 mmHg, MV mean gradient 4 mmHg), normal RV size and systolic function.  - Echo (12/16, [facility]) with EF 58%, mild RV dilation with low normal systolic function, PA systolic pressure 34 mmHg, bioprosthetic MV and AoV functioning normally.  - Echo (5/18, [facility]) with EF 60-65%, RV normal size and systolic function, moderate LAE, bioprosthetic MV mean gradient 6 with no MR, bioprosthetic aortic valve with mean gradient 11 and no AI, PASP 41 mmHg.  - Echo (2/19): EF 60-65%, moderate LVH, bioprosthetic aortic and mitral valves functioning normally, PASP 23 mmHg, aortic root 4.5 cm.  - Echo (9/19): EF 56%, normal RV size and systolic function, bioprosthetic MV with mean gradient 9 mmHg, trivial MR, bioprosthetic aortic valve with mean gradient 12 mmHg, PASP 51 mmHg.  - Echo (12/19): EF 60-65%, normal bioprosthetic aortic valve, bioprosthetic mitral valve with mean gradient 4 mmHg and no MR, severe LAE, aortic root 4.8 cm.  - Echo (3/20, [facility]): EF 60-65%, normal RV, severe LAE, 5.0 cm ascending aorta, bioprosthetic MV with mean gradient 7 mmHg, bioprosthetic aortic valve with mean gradient 9 mmHg.  - Echo (7/20, [facility]): EF 55-60%, moderate septal hypertrophy, normal  RV size and systolic function, bioprosthetic MV with trace MR and mean gradient 4, bioprosthetic aortic valve with mean gradient 14 mmHg, severe LAE.  4. Hyperlipidemia 5. L-spine surgery 6. LHC with no significant coronary disease in 2006 pre-op.  - Cardiolite (12/18): No ischemia.  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  CTA chest 9/15 with dilated sinuses of valsalva to 5.0 cm and ascending aorta 4.9 cm. CTA chest (2/16) with 5.0 cm ascending aorta.  CTA chest (12/16) with 4.9 cm aortic root at SOV, 4.8 cm ascending aorta.  - CT chest (5/18, [facility]): 5.0 cm aortic root, 5.0 cm ascending aorta.  - CTA chest (10/19, [facility]): 5.3 cm aortic root, 5.2 cm ascending aorta.  - CTA chest (3/20, [facility]): 5.0 cm aortic root and ascending aorta - CTA chest (7/20, [facility]): 5.1 cm ascending aorta 9. Chronic diastolic CHF  Current Outpatient Medications  Medication Sig Dispense Refill  . atorvastatin (LIPITOR) 80 MG tablet Take 1 tablet (80 mg total) by mouth every evening. 90 tablet 1  . clobetasol cream (TEMOVATE) [id number] % Apply 1 application topically daily as needed (for skin irritation).     . Coenzyme Q10 (CO Q-10) 300 MG CAPS Take 300 mg by mouth daily.    . Cyanocobalamin (VITAMIN B-12) 5000 MCG SUBL Place 5,000 mcg under the tongue daily at 2 PM.     . docusate sodium (COLACE) 100 MG capsule Take 1 capsule (100 mg total) by mouth 2 (two) times daily. (Patient taking differently: Take 100 mg by mouth daily. ) 10 capsule 0  . dofetilide (TIKOSYN) 500 MCG capsule TAKE 1 CAPSULE BY  MOUTH TWICE A DAY 180 capsule 0  . finasteride (PROSCAR) 5 MG tablet Take 5 mg by mouth daily.    . fish oil-omega-3 fatty acids 1000 MG capsule Take 1 g by mouth 2 (two) times daily.     . furosemide (LASIX) 20 MG tablet TAKE 1 TABLET ONCE DAILY. 30 tablet 0  . Glucosamine-Chondroitin  (COSAMIN DS PO) Take 1 tablet by mouth 2 (two) times daily.    [name] loratadine (CLARITIN) 10 MG tablet Take 10 mg by mouth daily as needed for allergies.     [name] losartan (COZAAR) 25 MG tablet Take 1 tablet (25 mg total) by mouth daily. 90 tablet 3  . metoprolol succinate (TOPROL-XL) 25 MG 24 hr tablet Take 3 tablets (75 mg total) by mouth 2 (two) times daily. Take with or immediately following a meal. 540 tablet 3  . Multiple Vitamins-Minerals (MULTIVITAL-M) TABS Take 1 tablet by mouth daily.     . potassium chloride SA (K-DUR) 20 MEQ tablet TAKE 1 TABLET ONCE DAILY. 30 tablet 0  . Probiotic Product (PROBIOTIC FORMULA PO) Take 1 capsule by mouth 2 (two) times daily.     [name] warfarin (COUMADIN) 5 MG tablet Take as directed by [facility] 120 tablet 1  . tamsulosin (FLOMAX) 0.4 MG CAPS capsule Take 0.4 mg by mouth 2 (two) times daily.     No current facility-administered medications for this encounter.     Allergies:   Naproxen   Social History:  The patient  reports that he has never smoked. He has never used smokeless tobacco. He reports current alcohol use of about 3.0 standard drinks of alcohol per week. He reports that he does not use drugs.   Family History:  The patient's family history includes Cancer in his father; Diabetes in his brother and paternal aunt; Heart attack in his maternal grandfather; Macular degeneration in his mother.   ROS:  Please see the history of present illness.   All other systems are personally reviewed and negative.   Exam:   BP 120/80   Pulse 75   Wt 93.8 kg (206 lb 12.8 oz)   SpO2 97%   BMI 31.44 kg/m  General: NAD Neck: No JVD, no thyromegaly or thyroid nodule.  Lungs: Clear to auscultation bilaterally with normal respiratory effort. CV: Nondisplaced PMI.  Heart regular S1/S2, no S3/S4, 2/6 SEM RUSB.  No peripheral edema.  No carotid bruit.  Normal pedal pulses.  Abdomen: Soft, nontender, no hepatosplenomegaly, no distention.  Skin: Intact without  lesions or rashes.  Neurologic: Alert and oriented x 3.  Psych: Normal affect. Extremities: No clubbing or cyanosis.  HEENT: Normal.    Recent Labs: 11/06/2018: BUN 17; Creatinine, Ser 0.95; Hemoglobin 12.7; Magnesium 2.2; Platelets 282; Potassium 4.0; Sodium 138  Personally reviewed   Wt Readings from Last 3 Encounters:  11/06/18 93.8 kg (206 lb 12.8 oz)  05/15/18 94.4 kg (208 lb 3.2 oz)  04/03/18 93.3 kg (205 lb 11 oz)      ASSESSMENT AND PLAN:  1. Atypical atrial flutter: Patient had a MAZE procedure with LAA ligation at the time of his valve replacement.  Since then, he has had occasional episodes of atypical atrial flutter. He had episodes in 10/13 and 3/15 that converted back to NSR on dronedarone and ranolazine. He was noted to be back in atrial flutter in 6/15 in the setting of a CVA.  He had ablation at [facility] in 11/15.  Atrial flutter recurred after ranolazine was stopped  and he had DCCV again in 4/16.  Atrial flutter recurred in 2/19, DCCV back to NSR but atrial flutter quickly recurred. Tikosyn was then loaded and he was cardioverted back to NSR in 4/19.  He required DCCV again in 6/19 and in 12/19 (required 4 shocks).  He went back to atrial flutter about 3 days after 12/19 DCCV.  He had redo flutter/fibrillation ablation at [facility] in 7/20 and has been in NSR since that time.  - Continue coumadin.  - Continue current Toprol XL. - Continue Tikosyn.  ECG showed stable QTc today.  Check BMET, Mg.  2. 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  Last echo at [facility] in 7/20 showed normal bioprosthetic aortic valve, with stable mean gradient 14 mmHg across the mitral valve.   3. HTN: BP needs good control given ascending aortic aneurysm. BP at goal. - Continue losartan and Toprol XL.  4. Hyperlipidemia: Good lipids in 6/20.     5. CVA: Suspect this was related to atrial flutter (despite LA appendage ligation).  He is now on  coumadin. No residual neurologic deficits.  Continue long-term anticoagulation. - If has to stop coumadin in the future, should get Lovenox bridging.  - CBC today.  6. Thoracic aortic aneurysm: Dilated aortic root at sinuses of valsalva and ascending aorta to 5.1 cm, last CTA in 7/20.   - Close followup, may need aortic root/ascending aorta replacement if ascending aorta expands further but not yet.  Getting serial CTs at [facility].  Next CTA chest will be due in 7/21.    - Continue Toprol XL given some evidence that beta blockers may attenuate thoracic aortic aneurysm growth.  7. Chronic diastolic CHF: Patient is not volume overloaded on exam.   - Continue Lasix 20 mg daily, will be getting BMET.  Followup in 4 months.    Signed, Loralie Champagne, MD  11/07/2018  [place] [address] [facility] and [place] [address] [phone] (office) [phone] (fax)

## 2018-11-16 DIAGNOSIS — I480 Paroxysmal atrial fibrillation: Secondary | ICD-10-CM | POA: Diagnosis not present

## 2018-11-22 ENCOUNTER — Other Ambulatory Visit: Payer: Self-pay

## 2018-11-22 ENCOUNTER — Ambulatory Visit (INDEPENDENT_AMBULATORY_CARE_PROVIDER_SITE_OTHER): Payer: Medicare Other | Admitting: *Deleted

## 2018-11-22 DIAGNOSIS — I482 Chronic atrial fibrillation, unspecified: Secondary | ICD-10-CM

## 2018-11-22 DIAGNOSIS — Z5181 Encounter for therapeutic drug level monitoring: Secondary | ICD-10-CM | POA: Diagnosis not present

## 2018-11-22 DIAGNOSIS — I4892 Unspecified atrial flutter: Secondary | ICD-10-CM

## 2018-11-22 LAB — POCT INR: INR: 2.4 (ref 2.0–3.0)

## 2018-11-22 NOTE — Patient Instructions (Signed)
Description   Continue taking 1.5 tablets daily except for 1 tablet on Sunday, Tuesday and Thursday. Recheck INR in 5 weeks. Call and let us know if scheduled for any procedures [phone] or medication changes.

## 2018-11-27 ENCOUNTER — Other Ambulatory Visit (HOSPITAL_COMMUNITY): Payer: Self-pay | Admitting: Cardiology

## 2018-12-06 ENCOUNTER — Other Ambulatory Visit: Payer: Self-pay

## 2018-12-06 ENCOUNTER — Ambulatory Visit (INDEPENDENT_AMBULATORY_CARE_PROVIDER_SITE_OTHER): Payer: Medicare Other

## 2018-12-06 DIAGNOSIS — Z23 Encounter for immunization: Secondary | ICD-10-CM | POA: Diagnosis not present

## 2018-12-24 ENCOUNTER — Ambulatory Visit (HOSPITAL_COMMUNITY)
Admission: RE | Admit: 2018-12-24 | Discharge: 2018-12-24 | Disposition: A | Discharge status: Home / Self Care | Payer: Medicare Other | Source: Ambulatory Visit | Attending: Nurse Practitioner | Admitting: Nurse Practitioner

## 2018-12-24 ENCOUNTER — Other Ambulatory Visit: Payer: Self-pay

## 2018-12-24 ENCOUNTER — Encounter (HOSPITAL_COMMUNITY): Payer: Self-pay | Admitting: Nurse Practitioner

## 2018-12-24 VITALS — BP 122/70 | HR 65 | Ht 68.0 in | Wt 205.5 lb

## 2018-12-24 DIAGNOSIS — I4819 Other persistent atrial fibrillation: Secondary | ICD-10-CM | POA: Insufficient documentation

## 2018-12-24 DIAGNOSIS — R7303 Prediabetes: Secondary | ICD-10-CM | POA: Insufficient documentation

## 2018-12-24 DIAGNOSIS — E785 Hyperlipidemia, unspecified: Secondary | ICD-10-CM | POA: Insufficient documentation

## 2018-12-24 DIAGNOSIS — I019 Acute rheumatic heart disease, unspecified: Secondary | ICD-10-CM | POA: Diagnosis not present

## 2018-12-24 DIAGNOSIS — Z79899 Other long term (current) drug therapy: Secondary | ICD-10-CM | POA: Insufficient documentation

## 2018-12-24 DIAGNOSIS — I4892 Unspecified atrial flutter: Secondary | ICD-10-CM | POA: Insufficient documentation

## 2018-12-24 DIAGNOSIS — Z7901 Long term (current) use of anticoagulants: Secondary | ICD-10-CM | POA: Diagnosis not present

## 2018-12-24 DIAGNOSIS — Z8673 Personal history of transient ischemic attack (TIA), and cerebral infarction without residual deficits: Secondary | ICD-10-CM | POA: Insufficient documentation

## 2018-12-24 NOTE — Progress Notes (Signed)
Date:  12/24/2018   ID:  [name] [name], DOB 02/27/1941, MRN [id number]  Location: home  Provider location: [address] [place], [place] [address] Evaluation Performed: Follow up  PCP:  [name], MD  Primary Cardiologist: Dr. [name] Primary Electrophysiologist: Dr. Rayann Heman  CC:  I been having afib.   History of Present Illness: [name] [name] is a 78 y.o. male who presents  for recent strips via apple phone of possible afib  The pt had been in persistent afib since March, despite prior ablations and tikosyn. He was seen in [place] [facility] in March. He underwent repeat ablation in July of this year. He has been sending strips tp [facility] weekly. It has not shown any afib. His apple watch though,  has been showing some strips with possible afib since early October. He is in SR today. Some of the strips on his phone s had to read. He has been haivng some PAC's. First degree AV block at baseline. He feels well. He does not feel afib just going off watch. .   Today, he denies symptoms of palpitations, chest pain, shortness of breath, orthopnea, PND, lower extremity edema, claudication, dizziness, presyncope, syncope, bleeding, or neurologic sequela. The patient is tolerating medications without difficulties and is otherwise without complaint today.   he denies symptoms of cough, fevers, chills, or new SOB worrisome for COVID 19.      Past Medical History:  Diagnosis Date  . Acute rheumatic heart disease, unspecified    childhood, age  37 & 29  . Acute rheumatic pericarditis   . Atrial fibrillation ([place])    history  . Diverticulosis   . Dysrhythmia   . 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 External hemorrhoids without mention of complication   . 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 Lesion of ulnar nerve   . 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 Other and unspecified hyperlipidemia   . Pre-diabetes   . Previous back surgery 1978, jan 2007  .  Psychosexual dysfunction with inhibited sexual excitement   . SOB (shortness of breath)    "with heavy exercise"  . Stroke ([place]) 08/2013   "I WAS IN AFIB AND THREW A CLOT, THE EFFECTS WERE TRANSITORY AND DIDNT LAST BUT FOR 30 MINUTES"   . Thoracic aortic aneurysm [facility])    Past Surgical History:  Procedure Laterality Date  . AORTIC AND MITRAL VALVE REPLACEMENT     09/2004  . CARDIOVERSION     3 times from 2004-2006  . CARDIOVERSION N/A 09/26/2013   Procedure: CARDIOVERSION;  Surgeon: Larey Dresser, MD;  Location: [place];  Service: Cardiovascular;  Laterality: N/A;  . CARDIOVERSION N/A 06/19/2014   Procedure: CARDIOVERSION;  Surgeon: Jerline Pain, MD;  Location: [place];  Service: Cardiovascular;  Laterality: N/A;  . CARDIOVERSION N/A 04/24/2017   Procedure: CARDIOVERSION;  Surgeon: Larey Dresser, MD;  Location: [facility] ENDOSCOPY;  Service: Cardiovascular;  Laterality: N/A;  . CARDIOVERSION N/A 06/08/2017   Procedure: CARDIOVERSION;  Surgeon: Pixie Casino, MD;  Location: [facility] ENDOSCOPY;  Service: Cardiovascular;  Laterality: N/A;  . CARDIOVERSION N/A 08/24/2017   Procedure: CARDIOVERSION;  Surgeon: Skeet Latch, MD;  Location: [facility] ENDOSCOPY;  Service: Cardiovascular;  Laterality: N/A;  . CARDIOVERSION N/A 02/14/2018   Procedure: CARDIOVERSION;  Surgeon: Larey Dresser, MD;  Location: [facility] ENDOSCOPY;  Service: Cardiovascular;  Laterality: N/A;  . COLONOSCOPY    .  laminectomies     10/1975 and in 03/2005  . TONSILLECTOMY     1950  . TOTAL HIP ARTHROPLASTY Right 04/03/2018   Procedure: TOTAL HIP ARTHROPLASTY ANTERIOR APPROACH;  Surgeon: Paralee Cancel, MD;  Location: [facility] ORS;  Service: Orthopedics;  Laterality: Right;  70 mins     Current Outpatient Medications  Medication Sig Dispense Refill  . atorvastatin (LIPITOR) 80 MG tablet Take 1 tablet (80 mg total) by mouth every evening. 90 tablet 1  . clobetasol cream (TEMOVATE) [id number] % Apply 1 application topically daily as  needed (for skin irritation).     . Coenzyme Q10 (CO Q-10) 300 MG CAPS Take 300 mg by mouth daily.    . Cyanocobalamin (VITAMIN B-12) 5000 MCG SUBL Place 5,000 mcg under the tongue daily at 2 PM.     . docusate sodium (COLACE) 100 MG capsule Take 1 capsule (100 mg total) by mouth 2 (two) times daily. (Patient taking differently: Take 100 mg by mouth daily. ) 10 capsule 0  . dofetilide (TIKOSYN) 500 MCG capsule TAKE 1 CAPSULE BY MOUTH TWICE A DAY 180 capsule 0  . finasteride (PROSCAR) 5 MG tablet Take 5 mg by mouth daily.    . fish oil-omega-3 fatty acids 1000 MG capsule Take 1 g by mouth 2 (two) times daily.     . furosemide (LASIX) 20 MG tablet TAKE 1 TABLET ONCE DAILY. 30 tablet 0  . Glucosamine-Chondroitin (COSAMIN DS PO) Take 1 tablet by mouth 2 (two) times daily.    [name] loratadine (CLARITIN) 10 MG tablet Take 10 mg by mouth daily as needed for allergies.     [name] losartan (COZAAR) 25 MG tablet Take 1 tablet (25 mg total) by mouth daily. 90 tablet 3  . metoprolol succinate (TOPROL-XL) 25 MG 24 hr tablet Take 3 tablets (75 mg total) by mouth 2 (two) times daily. Take with or immediately following a meal. 540 tablet 3  . Multiple Vitamins-Minerals (MULTIVITAL-M) TABS Take 1 tablet by mouth daily.     . potassium chloride SA (K-DUR) 20 MEQ tablet TAKE 1 TABLET ONCE DAILY. 30 tablet 0  . Probiotic Product (PROBIOTIC FORMULA PO) Take 1 capsule by mouth 2 (two) times daily.     . tamsulosin (FLOMAX) 0.4 MG CAPS capsule Take 0.4 mg by mouth 2 (two) times daily.    [name] warfarin (COUMADIN) 5 MG tablet Take as directed by [facility] Clinic 120 tablet 1   No current facility-administered medications for this encounter.     Allergies:   Naproxen   Social History:  The patient  reports that he has never smoked. He has never used smokeless tobacco. He reports current alcohol use of about 3.0 standard drinks of alcohol per week. He reports that he does not use drugs.   Family History:  The patient's  family  history includes Cancer in his father; Diabetes in his brother and paternal aunt; Heart attack in his maternal grandfather; Macular degeneration in his mother.    ROS:  Please see the history of present illness.   All other systems are personally reviewed and negative.   Exam: 122/70, HR  65 and regular, BMI 31.25, 205 Lbs General: NAD Neck: No JVD, no thyromegaly or thyroid nodule.  Lungs: Clear to auscultation bilaterally with normal respiratory effort. CV: Nondisplaced PMI.  Heart regular S1/S2, no S3/S4, 2/6 SEM RUSB.  No peripheral edema.  No carotid bruit.  Normal pedal pulses.  Abdomen: Soft, nontender, no hepatosplenomegaly, no distention.  Skin: Intact  without lesions or rashes.  Neurologic: Alert and oriented x 3.  Psych: Normal affect. Extremities: No clubbing or cyanosis.  HEENT: Normal.     Recent Labs: Bmet/mag checked in September and in range   Other studies personally reviewed: Epic records and care everywhere reviewed    ASSESSMENT AND PLAN: 1. Persistent afib S/p ablation in July at [facility] He is in [place] strips reviewed and is having some afib, mostly enjoying SR   He may need repeat labs and EKG for [facility], he may ask to have here and send to them He will let me know  Continue Tikosyn at 500 mcg bid  Continue toprol 75 mg bid  Continue  Coumadin for CHA2DS2VASc score of at least 3   1.Perssitent atrial  flutter/ atrial fibrillation  Pt had repeat ablation at [facility] July 2020. He is having some intermittent afib but appears to be mostly in SR.  Continue Tikosyn 500 mcg bid  Bmet/mag done in September and in range Continue warfain He was reassured that he is in SR today  This patients CHA2DS2-VASc Score and unadjusted Ischemic Stroke Rate (% per year) is equal to 4.8 % stroke rate/year from a score of 4  Above score calculated as 1 point each if present [CHF, HTN, DM, Vascular=MI/PAD/Aortic Plaque, Age if 65-74, or  Male] Above score calculated as 2 points each if present [Age > 75, or Stroke/TIA/TE]  COVID screen The patient does not have any symptoms that suggest any further testing/ screening at this time.  Social distancing reinforced today.    Follow-up per with [facility], Dr. [name] as scheduled  Current medicines are reviewed at length with the patient today.   The patient has concerns regarding his medicines(tikosyn).  The following changes were made today:  none  Labs/ tests ordered today include: none Orders Placed This Encounter  Procedures  . EKG 12-Lead    Patient Risk:  after full review of this patients clinical status, I feel that they are at moderate risk at this time.    Signed, Roderic Palau NP  12/24/2018 2:21 PM  [facility] [address] [place], [name] [address] [phone]

## 2018-12-27 ENCOUNTER — Ambulatory Visit (INDEPENDENT_AMBULATORY_CARE_PROVIDER_SITE_OTHER): Payer: Medicare Other | Admitting: *Deleted

## 2018-12-27 ENCOUNTER — Other Ambulatory Visit: Payer: Self-pay

## 2018-12-27 DIAGNOSIS — Z5181 Encounter for therapeutic drug level monitoring: Secondary | ICD-10-CM | POA: Diagnosis not present

## 2018-12-27 DIAGNOSIS — I4892 Unspecified atrial flutter: Secondary | ICD-10-CM

## 2018-12-27 DIAGNOSIS — I482 Chronic atrial fibrillation, unspecified: Secondary | ICD-10-CM | POA: Diagnosis not present

## 2018-12-27 LAB — POCT INR: INR: 2.7 (ref 2.0–3.0)

## 2018-12-27 NOTE — Patient Instructions (Signed)
Description   °Continue taking 1.5 tablets daily except for 1 tablet on Sunday, Tuesday and Thursday. Recheck INR in 6 weeks. Call and let us know if scheduled for any procedures [phone] or medication changes.  °  ° °

## 2019-02-07 ENCOUNTER — Other Ambulatory Visit: Payer: Self-pay

## 2019-02-07 ENCOUNTER — Ambulatory Visit (INDEPENDENT_AMBULATORY_CARE_PROVIDER_SITE_OTHER): Payer: Medicare Other | Admitting: *Deleted

## 2019-02-07 DIAGNOSIS — I4892 Unspecified atrial flutter: Secondary | ICD-10-CM | POA: Diagnosis not present

## 2019-02-07 DIAGNOSIS — I482 Chronic atrial fibrillation, unspecified: Secondary | ICD-10-CM | POA: Diagnosis not present

## 2019-02-07 DIAGNOSIS — Z5181 Encounter for therapeutic drug level monitoring: Secondary | ICD-10-CM

## 2019-02-07 LAB — POCT INR: INR: 2.7 (ref 2.0–3.0)

## 2019-02-07 NOTE — Patient Instructions (Signed)
Description   °Continue taking 1.5 tablets daily except for 1 tablet on Sunday, Tuesday and Thursday. Recheck INR in 6 weeks. Call and let us know if scheduled for any procedures [phone] or medication changes.  °  ° °

## 2019-03-20 ENCOUNTER — Ambulatory Visit: Payer: Medicare Other

## 2019-03-21 ENCOUNTER — Ambulatory Visit (INDEPENDENT_AMBULATORY_CARE_PROVIDER_SITE_OTHER): Payer: Medicare Other | Admitting: *Deleted

## 2019-03-21 ENCOUNTER — Other Ambulatory Visit: Payer: Self-pay

## 2019-03-21 ENCOUNTER — Telehealth (HOSPITAL_COMMUNITY): Payer: Self-pay

## 2019-03-21 DIAGNOSIS — I4892 Unspecified atrial flutter: Secondary | ICD-10-CM

## 2019-03-21 DIAGNOSIS — I482 Chronic atrial fibrillation, unspecified: Secondary | ICD-10-CM

## 2019-03-21 DIAGNOSIS — Z5181 Encounter for therapeutic drug level monitoring: Secondary | ICD-10-CM

## 2019-03-21 LAB — POCT INR: INR: 3.1 — AB (ref 2.0–3.0)

## 2019-03-21 NOTE — Patient Instructions (Signed)
Description   Continue taking 1.5 tablets daily except for 1 tablet on Sunday, Tuesday and Thursday. Recheck INR in 6 weeks. Eat an extra serving of greens today. Call and let us know if scheduled for any procedures [phone] or medication changes.

## 2019-03-21 NOTE — Telephone Encounter (Signed)
         COVID-19 pre-appointment screening questions:   Do you have a history of COVID-19 or a positive test result in the past 7-10 days? no  To the best of your knowledge, have you been in close contact with anyone with a confirmed diagnosis of COVID 19? no  Have you had any one or more of the following: Fever, chills, cough, shortness of breath (out of the normal for you) or any flu-like symptoms? no  Are you experiencing any of the following symptoms that is new or out of usual for you: no  . Ear, nose or throat discomfort . Sore throat . Headache . Muscle Pain . Diarrhea . Loss of taste or smell   Reviewed all the following with patient: . Use of hand sanitizer when entering the building . Everyone is required to wear a mask in the building, if you do not have a mask we are happy to provide you with one when you arrive . NO Visitor guidelines   If patient answers YES to any of questions they must change to a virtual visit and place note in comments about symptoms   

## 2019-03-22 ENCOUNTER — Ambulatory Visit (HOSPITAL_COMMUNITY)
Admission: RE | Admit: 2019-03-22 | Discharge: 2019-03-22 | Disposition: A | Discharge status: Home / Self Care | Payer: Medicare Other | Source: Ambulatory Visit | Attending: Cardiology | Admitting: Cardiology

## 2019-03-22 ENCOUNTER — Encounter (HOSPITAL_COMMUNITY): Payer: Self-pay | Admitting: Cardiology

## 2019-03-22 ENCOUNTER — Other Ambulatory Visit: Payer: Self-pay

## 2019-03-22 VITALS — BP 118/79 | HR 70 | Wt 209.2 lb

## 2019-03-22 DIAGNOSIS — I484 Atypical atrial flutter: Secondary | ICD-10-CM | POA: Diagnosis not present

## 2019-03-22 DIAGNOSIS — I482 Chronic atrial fibrillation, unspecified: Secondary | ICD-10-CM

## 2019-03-22 DIAGNOSIS — I4891 Unspecified atrial fibrillation: Secondary | ICD-10-CM | POA: Insufficient documentation

## 2019-03-22 DIAGNOSIS — Z7901 Long term (current) use of anticoagulants: Secondary | ICD-10-CM | POA: Diagnosis not present

## 2019-03-22 DIAGNOSIS — Z953 Presence of xenogenic heart valve: Secondary | ICD-10-CM | POA: Insufficient documentation

## 2019-03-22 DIAGNOSIS — Z833 Family history of diabetes mellitus: Secondary | ICD-10-CM | POA: Insufficient documentation

## 2019-03-22 DIAGNOSIS — Z886 Allergy status to analgesic agent status: Secondary | ICD-10-CM | POA: Diagnosis not present

## 2019-03-22 DIAGNOSIS — I059 Rheumatic mitral valve disease, unspecified: Secondary | ICD-10-CM

## 2019-03-22 DIAGNOSIS — E785 Hyperlipidemia, unspecified: Secondary | ICD-10-CM | POA: Insufficient documentation

## 2019-03-22 DIAGNOSIS — I712 Thoracic aortic aneurysm, without rupture: Secondary | ICD-10-CM | POA: Insufficient documentation

## 2019-03-22 DIAGNOSIS — I5032 Chronic diastolic (congestive) heart failure: Secondary | ICD-10-CM | POA: Diagnosis not present

## 2019-03-22 DIAGNOSIS — I110 Hypertensive heart disease with heart failure: Secondary | ICD-10-CM | POA: Insufficient documentation

## 2019-03-22 DIAGNOSIS — I4892 Unspecified atrial flutter: Secondary | ICD-10-CM | POA: Diagnosis not present

## 2019-03-22 DIAGNOSIS — I099 Rheumatic heart disease, unspecified: Secondary | ICD-10-CM | POA: Insufficient documentation

## 2019-03-22 DIAGNOSIS — Z8673 Personal history of transient ischemic attack (TIA), and cerebral infarction without residual deficits: Secondary | ICD-10-CM | POA: Insufficient documentation

## 2019-03-22 DIAGNOSIS — Z79899 Other long term (current) drug therapy: Secondary | ICD-10-CM | POA: Insufficient documentation

## 2019-03-22 LAB — CBC
HCT: 41.9 % (ref 39.0–52.0)
Hemoglobin: 13.5 g/dL (ref 13.0–17.0)
MCH: 30.6 pg (ref 26.0–34.0)
MCHC: 32.2 g/dL (ref 30.0–36.0)
MCV: 95 fL (ref 80.0–100.0)
Platelets: 266 10*3/uL (ref 150–400)
RBC: 4.41 MIL/uL (ref 4.22–5.81)
RDW: 13.6 % (ref 11.5–15.5)
WBC: 12.2 10*3/uL — ABNORMAL HIGH (ref 4.0–10.5)
nRBC: 0 % (ref 0.0–0.2)

## 2019-03-22 LAB — BASIC METABOLIC PANEL
Anion gap: 10 (ref 5–15)
BUN: 18 mg/dL (ref 8–23)
CO2: 22 mmol/L (ref 22–32)
Calcium: 8.8 mg/dL — ABNORMAL LOW (ref 8.9–10.3)
Chloride: 105 mmol/L (ref 98–111)
Creatinine, Ser: 0.94 mg/dL (ref 0.61–1.24)
GFR calc Af Amer: >60 mL/min (ref >60)
GFR calc non Af Amer: >60 mL/min (ref >60)
Glucose, Bld: 156 mg/dL — ABNORMAL HIGH (ref 70–99)
Potassium: 4 mmol/L (ref 3.5–5.1)
Sodium: 137 mmol/L (ref 135–145)

## 2019-03-22 LAB — LIPID PANEL
Cholesterol: 157 mg/dL (ref 0–200)
HDL: 39 mg/dL — ABNORMAL LOW (ref >40)
LDL Cholesterol: 80 mg/dL (ref 0–99)
Total CHOL/HDL Ratio: 4 RATIO
Triglycerides: 191 mg/dL — ABNORMAL HIGH (ref <150)
VLDL: 38 mg/dL (ref 0–40)

## 2019-03-22 MED ORDER — DOFETILIDE 500 MCG PO CAPS: 500.0000 ug | ORAL_CAPSULE | Freq: Two times a day (BID) | ORAL | 3 refills | Status: DC

## 2019-03-22 MED ORDER — WARFARIN SODIUM 5 MG PO TABS: ORAL_TABLET | ORAL | 1 refills | Status: DC

## 2019-03-22 MED ORDER — LOSARTAN POTASSIUM 25 MG PO TABS: 25.0000 mg | ORAL_TABLET | Freq: Every day | ORAL | 3 refills | Status: DC

## 2019-03-22 MED ORDER — ATORVASTATIN CALCIUM 80 MG PO TABS: 80.0000 mg | ORAL_TABLET | Freq: Every evening | ORAL | 3 refills | Status: DC

## 2019-03-22 MED ORDER — METOPROLOL SUCCINATE ER 25 MG PO TB24: 75.0000 mg | ORAL_TABLET | Freq: Two times a day (BID) | ORAL | 3 refills | Status: DC

## 2019-03-22 NOTE — Patient Instructions (Signed)
No medication changes today!  Labs today We will only contact you if something comes back abnormal or we need to make some changes. Otherwise no news is good news!   Your physician recommends that you schedule a follow-up appointment in: 4 months with Dr Mclean.    Please call office at [phone] option [phone] if you have any questions or concerns.    At the [facility], you and your health needs are our priority. As part of our continuing mission to provide you with exceptional heart care, we have created designated Provider Care Teams. These Care Teams include your primary Cardiologist (physician) and Advanced Practice Providers (APPs- Physician Assistants and Nurse Practitioners) who all work together to provide you with the care you need, when you need it.   You may see any of the following providers on your designated Care Team at your next follow up: . Dr Daniel Bensimhon . Dr Dalton McLean . Amy Clegg, NP . Brittainy Simmons, PA . Lauren Kemp, PharmD   Please be sure to bring in all your medications bottles to every appointment.     

## 2019-03-23 NOTE — Progress Notes (Signed)
Date:  03/23/2019   ID:  [name] [name], DOB 1940-12-18, MRN [id number]    Provider location: [place] [facility] Type of Visit: Established patient  PCP:  [name] Lima, MD  Cardiologist:  Dr. [name]   History of Present Illness: [name] [name] is a 79 y.o. 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  He also has an ascending aortic aneurysm.   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  I started him on dronedarone and coumadin with plan for cardioversion after 4 wks of therapeutic anticoagulation if he did not convert back to NSR on his own.  He went back into NSR spontaneously and later stopped coumadin. Atrial flutter recurred in 3/15 and again resolved spontaneously.  In 6/15, patient was admitted with transient dysarthria (resolved in about 10 minutes).  However, he was found to have evidence for CVA by MRI.  Therefore, he was started on coumadin. He was noted to be in atypical atrial flutter again.  Ranolazine was added to dronedarone.    In 8/15, [name] was seen in consultation at the [facility] clinic.  He had atrial flutter ablation at [facility] in 11/15.    He was seen in followup at [facility] in 2/16.  Ranolazine was stopped.  Echo in 2/16 showed EF 55% with stable bioprosthetic mitral and aortic valves.  CTA chest showed stable 5 cm ascending aortic aneurysm.  He went back into atypical atrial flutter in 4/16.  Ranolazine was restarted and he was cardioverted back to NSR.    Cardiolite in 12/18 showed no ischemia. He wore a 30 day monitor in 12/18 with no arrhythmia.    He went into atrial flutter again during a respiratory illness in 2/19.  On 04/24/17, he was cardioverted back to NSR.  He thinks that he went into atrial flutter again later on that day. He was back in atrial flutter at followup  appointment. He was then admitted for Tikosyn loading in 4/19 and was cardioverted back to NSR after the load.   He had atrial fibrillation again in 6/19 with DCCV.   He was seen at the [facility] in 9/19.  Echo showed normal LV and RV systolic function, bioprosthetic MV with mean gradient up to 9 mmHg, normal bioprosthetic AoV.  CTA chest showed aortic root up to 5.3 cm.   He was back in atrial flutter in 12/19, had DCCV back to NSR.  Echo in 12/19 showed EF 60-65%, normal bioprosthetic aortic valve, bioprosthetic mitral valve with mean gradient 4 mmHg and no MR, severe LAE, aortic root 4.8 cm.   He went to the [facility] in 3/20.  Echo at that time showed EF 60-65% with stable bioprosthetic mitral and aortic valves (mean gradient 7 mmHg across MV and 9 mmHg across AoV).  CTA showed 5.0 cm aortic root and ascending aorta, stable.  He returned to the [facility] in 7/20.  He had atrial fibrillation/flutter ablation again and thinks he has been in NSR since that time.  CTA chest showed 5.1 cm ascending thoracic aorta.   He returns for followup of diastolic CHF, atrial fibrillation, and bioprosthetic AVR and MVR.  He is in  NSR today, and he feels like he has been in NSR since 7/20 ablation.  He walks regularly for  exercise, about 1 and 1/3 miles/day.  No chest pain.  No orthopnea/PND.  No significant exertional dyspnea.  No BRBPR/melena.  Weight up 3 lbs.   ECG (12/12/11): Atypical atrial flutter versus atrial tachycardia with rate around 100 bpm, QTc normal.  ECG (12/29/11): NSR, 1st degree AV block PR 242 msec.    ECG (12/13): NSR, 1st degree AV block PR 244 msec    ECG (10/14): NSR, 1st degree AV block ECG (3/15): Atypical atrial flutter with rate 87, inferior T wave inversions ECG (6/15): Atypical atrial flutter with rate 77 ECG (8/15): atypical atrial flutter rate 82 ECG (10/15): NSR, 1st degree AV block ECG (12/15): NSR, 1st degree AV block, QTc 410 msec ECG (5/16):  NSR, 1st degree AV block, QTc 419 msec ECG (5/17): NSR, 1st degree AV block 270 msec, QTc 433 ECG (2/18): NSR, 1st degree AVB, nonspecific ST-T changes, QTc 435 msec ECG (11/18): NSR, 1st degree AVB, QTc 424 msec ECG (2/19, personally reviewed): atypical atrial flutter at 87, QTc 442 msec ECG (4/19, personally reviewed): NSR, 1st degree AVB, QTc 453 msec ECG (10/19, personally reviewed): NSR, 1st degree AVB, QTc 448 msec ECG (1/20, personally reviewed): atypical atrial flutter in 90s, QTc 472 msec.  ECG (8/20): NSR, 1st degree AVB, QTc 448 msec  ECG (1/21, personally reviewed): NSR, 1st degree AVB, QTc 434 msec  Labs (3/14): LDL 64, TSH normal, K 3.9, creatinine 1.0     Labs (6/15): LDL 54, HDL 48, creatinine 0.95   Labs (9/15): LDL 54, HDL 25 Labs (10/15): K 4.5, creatinine 1.3, AST 45, ALT 64 Labs (4/16): K 4.3, creatinine 1.28, HCT 40.7 Labs (5/17): K 4.1, creatinine 1.16, LDL 66, HDL 47 Labs (2/18): LDL 42, HDL 43, K 4.4, creatinine 1.48 Labs (3/18): hgb 13.8 Labs (2/19): LDL 59 Labs (4/19): K 3.9, creatinine 0.97, Mg 2.0 Labs (6/19): K 4.4, creatinine 0.9 Labs (10/19): LDL 84 Labs (12/19): K 4.2, creatinine 1.13, hgb 13.9 Labs (3/20): K 4.1, creatinine 0.98 Labs (6/20): K 4.1, creatinine 0.91, LDL 103  Labs (9/20): K 4, creatinine 0.95                                                                                                                                 PMH: 1. Atrial fibrillation: s/p MAZE and LAA ligation in 2006.  No documented atrial fibrillation since operation.  2. Atypical atrial flutter: 10/13, 3/15, and again in 6/15.  Recurrent 4/16 with DCCV.  - Event monitor (12/18): no significant arrhythmia.  - Recurrence in 2/19: DCCV to NSR but quickly recurred.  - Admitted in 4/19 for Tikosyn loading and cardioverted to NSR.  - DCCV to NSR in 6/19.  - DCCV to NSR in 12/19. - 7/20 redo atrial flutter/fibrillation ablation.   3. 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   - Echo 5/11 with EF 60-65%, normal bioprosthetic aortic  valve, normal bioprosthetic mitral valve, moderate LAE.   - Echo 11/12 at [facility] looked ok per report.  - Echo (10/13) with EF 55-60%, mild LVH, normal bioprosthetic aortic and mitral valves, moderate LAE, PA systolic pressure 35 mmHg.   - Echo (6/15) with EF 60%, moderate LVH, normal-appearing bioprosthetic MV and AoV, PA systolic pressure 32 mmHg.   - Echo 9/15 [facility]) with EF 55%, mild LVH, PASP 52 mmHg, severe LAE, bioprosthetic mitral valve with no MR and mean gradient 11 mmHg, moderate TR, bioprosthetic aortic valve with no AI and mean gradient 9 mmHg, dilated ascending aorta to 5 cm.   - Echo (2/16, [facility]) with EF 55%, stable bioprosthetic aortic and mitral valves (AoV mean gradient 7 mmHg, MV mean gradient 4 mmHg), normal RV size and systolic function.  - Echo (12/16, [facility]) with EF 58%, mild RV dilation with low normal systolic function, PA systolic pressure 34 mmHg, bioprosthetic MV and AoV functioning normally.  - Echo (5/18, [facility]) with EF 60-65%, RV normal size and systolic function, moderate LAE, bioprosthetic MV mean gradient 6 with no MR, bioprosthetic aortic valve with mean gradient 11 and no AI, PASP 41 mmHg.  - Echo (2/19): EF 60-65%, moderate LVH, bioprosthetic aortic and mitral valves functioning normally, PASP 23 mmHg, aortic root 4.5 cm.  - Echo (9/19): EF 56%, normal RV size and systolic function, bioprosthetic MV with mean gradient 9 mmHg, trivial MR, bioprosthetic aortic valve with mean gradient 12 mmHg, PASP 51 mmHg.  - Echo (12/19): EF 60-65%, normal bioprosthetic aortic valve, bioprosthetic mitral valve with mean gradient 4 mmHg and no MR, severe LAE, aortic root 4.8 cm.  - Echo (3/20, [facility]): EF 60-65%, normal RV, severe LAE, 5.0 cm ascending aorta, bioprosthetic MV with mean  gradient 7 mmHg, bioprosthetic aortic valve with mean gradient 9 mmHg.  - Echo (7/20, [facility]): EF 55-60%, moderate septal hypertrophy, normal RV size and systolic function, bioprosthetic MV with trace MR and mean gradient 4, bioprosthetic aortic valve with mean gradient 14 mmHg, severe LAE.  4. Hyperlipidemia 5. L-spine surgery 6. LHC with no significant coronary disease in 2006 pre-op.  - Cardiolite (12/18): No ischemia.  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  CTA chest 9/15 with dilated sinuses of valsalva to 5.0 cm and ascending aorta 4.9 cm. CTA chest (2/16) with 5.0 cm ascending aorta.  CTA chest (12/16) with 4.9 cm aortic root at SOV, 4.8 cm ascending aorta.  - CT chest (5/18, [facility]): 5.0 cm aortic root, 5.0 cm ascending aorta.  - CTA chest (10/19, [facility]): 5.3 cm aortic root, 5.2 cm ascending aorta.  - CTA chest (3/20, [facility]): 5.0 cm aortic root and ascending aorta - CTA chest (7/20, [facility]): 5.1 cm ascending aorta 9. Chronic diastolic CHF  Current Outpatient Medications  Medication Sig Dispense Refill  . atorvastatin (LIPITOR) 80 MG tablet Take 1 tablet (80 mg total) by mouth every evening. 90 tablet 3  . clobetasol cream (TEMOVATE) [id number] % Apply 1 application topically daily as needed (for skin irritation).     . Coenzyme Q10 (CO Q-10) 300 MG CAPS Take 300 mg by mouth daily.    . Cyanocobalamin (VITAMIN B-12) 5000 MCG SUBL Place 5,000 mcg under the tongue daily at 2 PM.     . docusate sodium (COLACE) 100 MG capsule Take 1 capsule (100 mg total) by mouth 2 (two)  times daily. 10 capsule 0  . dofetilide (TIKOSYN) 500 MCG capsule Take 1 capsule (500 mcg total) by mouth 2 (two) times daily. 180 capsule 3  . finasteride (PROSCAR) 5 MG tablet Take 5 mg by mouth daily.    . fish oil-omega-3 fatty acids 1000 MG capsule Take 1 g by mouth 2 (two)  times daily.     . furosemide (LASIX) 20 MG tablet TAKE 1 TABLET ONCE DAILY. 30 tablet 0  . Glucosamine-Chondroitin (COSAMIN DS PO) Take 1 tablet by mouth 2 (two) times daily.    [name] loratadine (CLARITIN) 10 MG tablet Take 10 mg by mouth daily as needed for allergies.     [name] losartan (COZAAR) 25 MG tablet Take 1 tablet (25 mg total) by mouth daily. 90 tablet 3  . metoprolol succinate (TOPROL-XL) 25 MG 24 hr tablet Take 3 tablets (75 mg total) by mouth 2 (two) times daily. Take with or immediately following a meal. 540 tablet 3  . Multiple Vitamins-Minerals (MULTIVITAL-M) TABS Take 1 tablet by mouth daily.     . potassium chloride SA (K-DUR) 20 MEQ tablet TAKE 1 TABLET ONCE DAILY. 30 tablet 0  . Probiotic Product (PROBIOTIC FORMULA PO) Take 1 capsule by mouth 2 (two) times daily.     . tamsulosin (FLOMAX) 0.4 MG CAPS capsule Take 0.4 mg by mouth 2 (two) times daily.    [name] warfarin (COUMADIN) 5 MG tablet Take as directed by [facility] 120 tablet 1   No current facility-administered medications for this encounter.    Allergies:   Naproxen   Social History:  The patient  reports that he has never smoked. He has never used smokeless tobacco. He reports current alcohol use of about 3.0 standard drinks of alcohol per week. He reports that he does not use drugs.   Family History:  The patient's family history includes Cancer in his father; Diabetes in his brother and paternal aunt; Heart attack in his maternal grandfather; Macular degeneration in his mother.   ROS:  Please see the history of present illness.   All other systems are personally reviewed and negative.   Exam:   BP 118/79   Pulse 70   Wt 94.9 kg (209 lb 3.2 oz)   SpO2 96%   BMI 31.81 kg/m  General: NAD Neck: No JVD, no thyromegaly or thyroid nodule.  Lungs: Clear to auscultation bilaterally with normal respiratory effort. CV: Nondisplaced PMI.  Heart regular S1/S2, no S3/S4, 2/6 SEM RUSB.  No peripheral edema.  No carotid  bruit.  Normal pedal pulses.  Abdomen: Soft, nontender, no hepatosplenomegaly, no distention.  Skin: Intact without lesions or rashes.  Neurologic: Alert and oriented x 3.  Psych: Normal affect. Extremities: No clubbing or cyanosis.  HEENT: Normal.   Recent Labs: 11/06/2018: Magnesium 2.2 03/22/2019: BUN 18; Creatinine, Ser 0.94; Hemoglobin 13.5; Platelets 266; Potassium 4.0; Sodium 137  Personally reviewed   Wt Readings from Last 3 Encounters:  03/22/19 94.9 kg (209 lb 3.2 oz)  12/24/18 93.2 kg (205 lb 8 oz)  11/06/18 93.8 kg (206 lb 12.8 oz)     ASSESSMENT AND PLAN:  1. Atypical atrial flutter: Patient had a MAZE procedure with LAA ligation at the time of his valve replacement.  Since then, he has had occasional episodes of atypical atrial flutter. He had episodes in 10/13 and 3/15 that converted back to NSR on dronedarone and ranolazine. He was noted to be back in atrial flutter in 6/15 in the setting of a  CVA.  He had ablation at [facility] in 11/15.  Atrial flutter recurred after ranolazine was stopped and he had DCCV again in 4/16.  Atrial flutter recurred in 2/19, DCCV back to NSR but atrial flutter quickly recurred. Tikosyn was then loaded and he was cardioverted back to NSR in 4/19.  He required DCCV again in 6/19 and in 12/19 (required 4 shocks).  He went back to atrial flutter about 3 days after 12/19 DCCV.  He had redo flutter/fibrillation ablation at [facility] in 7/20 and has been in NSR since that time.  - Continue coumadin.  - Continue current Toprol XL. - Continue Tikosyn.  ECG showed stable QTc today.  BMET, Mg.   2. 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  Last echo at [facility] in 7/20 showed normal bioprosthetic aortic valve, with stable mean gradient 14 mmHg across the mitral valve.   - He will need repeat echo in 7/21.  3. HTN: BP needs good control given ascending aortic aneurysm. BP at goal. - Continue losartan and  Toprol XL.  4. Hyperlipidemia: Check lipids today, continue atorvastatin.      5. CVA: Suspect this was related to atrial flutter (despite LA appendage ligation).  He is now on coumadin. No residual neurologic deficits. Continue long-term anticoagulation. - If has to stop coumadin in the future, should get Lovenox bridging.  - CBC today.  6. Thoracic aortic aneurysm: Dilated aortic root at sinuses of valsalva and ascending aorta to 5.1 cm, last CTA in 7/20.   - Close followup, may need aortic root/ascending aorta replacement if ascending aorta expands further but not yet.  Getting serial CTs. Next CTA chest will be due in 7/21.    - Continue Toprol XL given some evidence that beta blockers may attenuate thoracic aortic aneurysm growth.  7. Chronic diastolic CHF: Patient is not volume overloaded on exam.   - Continue Lasix 20 mg daily, will be getting BMET.  Followup in 4 months.    Signed, Loralie Champagne, MD  03/23/2019  [facility] [address] [facility] and [place] [address] [phone] (office) [phone] (fax)

## 2019-03-28 ENCOUNTER — Telehealth (HOSPITAL_COMMUNITY): Payer: Self-pay | Admitting: Pharmacist

## 2019-03-28 MED ORDER — METOPROLOL SUCCINATE ER 50 MG PO TB24: ORAL_TABLET | ORAL | 3 refills | Status: DC

## 2019-03-28 NOTE — Telephone Encounter (Signed)
Sent in prescription for metoprolol succinate to [facility] mail order pharmacy.   Audry Riles, PharmD, BCPS, BCCP, CPP [facility] Pharmacist [phone]

## 2019-04-04 ENCOUNTER — Other Ambulatory Visit (HOSPITAL_COMMUNITY): Payer: Self-pay | Admitting: Cardiology

## 2019-04-23 ENCOUNTER — Other Ambulatory Visit (HOSPITAL_COMMUNITY): Payer: Self-pay | Admitting: *Deleted

## 2019-04-23 MED ORDER — METOPROLOL SUCCINATE ER 50 MG PO TB24: ORAL_TABLET | ORAL | 3 refills | Status: DC

## 2019-05-01 DIAGNOSIS — L821 Other seborrheic keratosis: Secondary | ICD-10-CM | POA: Diagnosis not present

## 2019-05-01 DIAGNOSIS — L570 Actinic keratosis: Secondary | ICD-10-CM | POA: Diagnosis not present

## 2019-05-01 DIAGNOSIS — D225 Melanocytic nevi of trunk: Secondary | ICD-10-CM | POA: Diagnosis not present

## 2019-05-01 DIAGNOSIS — D692 Other nonthrombocytopenic purpura: Secondary | ICD-10-CM | POA: Diagnosis not present

## 2019-05-01 DIAGNOSIS — D1801 Hemangioma of skin and subcutaneous tissue: Secondary | ICD-10-CM | POA: Diagnosis not present

## 2019-05-01 DIAGNOSIS — L218 Other seborrheic dermatitis: Secondary | ICD-10-CM | POA: Diagnosis not present

## 2019-05-02 ENCOUNTER — Ambulatory Visit (INDEPENDENT_AMBULATORY_CARE_PROVIDER_SITE_OTHER): Payer: Medicare Other | Admitting: *Deleted

## 2019-05-02 ENCOUNTER — Other Ambulatory Visit: Payer: Self-pay

## 2019-05-02 DIAGNOSIS — Z7901 Long term (current) use of anticoagulants: Secondary | ICD-10-CM

## 2019-05-02 DIAGNOSIS — I4892 Unspecified atrial flutter: Secondary | ICD-10-CM

## 2019-05-02 DIAGNOSIS — Z5181 Encounter for therapeutic drug level monitoring: Secondary | ICD-10-CM | POA: Diagnosis not present

## 2019-05-02 DIAGNOSIS — I482 Chronic atrial fibrillation, unspecified: Secondary | ICD-10-CM

## 2019-05-02 LAB — POCT INR: INR: 2.6 (ref 2.0–3.0)

## 2019-05-02 NOTE — Patient Instructions (Signed)
Description   °Continue taking 1.5 tablets daily except for 1 tablet on Sunday, Tuesday and Thursday. Recheck INR in 6 weeks. Call and let us know if scheduled for any procedures [phone] or medication changes.  °  ° °

## 2019-05-15 ENCOUNTER — Telehealth (HOSPITAL_COMMUNITY): Payer: Self-pay | Admitting: Pharmacist

## 2019-05-15 NOTE — Telephone Encounter (Signed)
Patient Advocate Encounter   Received notification from [facility] that prior authorization for metoprolol succinate for quantity limit exception is required.   PA submitted on CoverMyMeds Key [id number] Status is pending   Will continue to follow.  Audry Riles, PharmD, BCPS, BCCP, CPP [facility] Pharmacist [phone]

## 2019-05-15 NOTE — Telephone Encounter (Signed)
Advanced [facility] Failure Patient Advocate Encounter  Prior Authorization for metoprolol succinate has been approved.    Effective dates: 05/15/19 through 03/06/20  Audry Riles, PharmD, BCPS, BCCP, CPP [facility] Pharmacist [phone]

## 2019-05-16 DIAGNOSIS — R972 Elevated prostate specific antigen [PSA]: Secondary | ICD-10-CM | POA: Diagnosis not present

## 2019-05-29 DIAGNOSIS — N401 Enlarged prostate with lower urinary tract symptoms: Secondary | ICD-10-CM | POA: Diagnosis not present

## 2019-05-29 DIAGNOSIS — R3914 Feeling of incomplete bladder emptying: Secondary | ICD-10-CM | POA: Diagnosis not present

## 2019-05-29 DIAGNOSIS — R972 Elevated prostate specific antigen [PSA]: Secondary | ICD-10-CM | POA: Diagnosis not present

## 2019-06-04 DIAGNOSIS — H2513 Age-related nuclear cataract, bilateral: Secondary | ICD-10-CM | POA: Diagnosis not present

## 2019-06-04 DIAGNOSIS — H1033 Unspecified acute conjunctivitis, bilateral: Secondary | ICD-10-CM | POA: Diagnosis not present

## 2019-06-04 DIAGNOSIS — H1045 Other chronic allergic conjunctivitis: Secondary | ICD-10-CM | POA: Diagnosis not present

## 2019-06-04 DIAGNOSIS — H25813 Combined forms of age-related cataract, bilateral: Secondary | ICD-10-CM | POA: Diagnosis not present

## 2019-06-13 ENCOUNTER — Other Ambulatory Visit: Payer: Self-pay

## 2019-06-13 ENCOUNTER — Ambulatory Visit (INDEPENDENT_AMBULATORY_CARE_PROVIDER_SITE_OTHER): Payer: Medicare Other | Admitting: Pharmacist

## 2019-06-13 DIAGNOSIS — I4892 Unspecified atrial flutter: Secondary | ICD-10-CM | POA: Diagnosis not present

## 2019-06-13 DIAGNOSIS — Z5181 Encounter for therapeutic drug level monitoring: Secondary | ICD-10-CM | POA: Diagnosis not present

## 2019-06-13 DIAGNOSIS — Z7901 Long term (current) use of anticoagulants: Secondary | ICD-10-CM | POA: Diagnosis not present

## 2019-06-13 DIAGNOSIS — I482 Chronic atrial fibrillation, unspecified: Secondary | ICD-10-CM | POA: Diagnosis not present

## 2019-06-13 LAB — POCT INR: INR: 2.2 (ref 2.0–3.0)

## 2019-06-13 NOTE — Patient Instructions (Signed)
Description   °Continue taking 1.5 tablets daily except for 1 tablet on Sunday, Tuesday and Thursday. Recheck INR in 6 weeks. Call and let us know if scheduled for any procedures [phone] or medication changes.  °  ° °

## 2019-07-02 ENCOUNTER — Ambulatory Visit: Payer: Medicare Other

## 2019-07-09 DIAGNOSIS — H25043 Posterior subcapsular polar age-related cataract, bilateral: Secondary | ICD-10-CM | POA: Diagnosis not present

## 2019-07-09 DIAGNOSIS — H2513 Age-related nuclear cataract, bilateral: Secondary | ICD-10-CM | POA: Diagnosis not present

## 2019-07-09 DIAGNOSIS — H25013 Cortical age-related cataract, bilateral: Secondary | ICD-10-CM | POA: Diagnosis not present

## 2019-07-09 DIAGNOSIS — H18413 Arcus senilis, bilateral: Secondary | ICD-10-CM | POA: Diagnosis not present

## 2019-07-09 DIAGNOSIS — H2512 Age-related nuclear cataract, left eye: Secondary | ICD-10-CM | POA: Diagnosis not present

## 2019-07-09 DIAGNOSIS — H353131 Nonexudative age-related macular degeneration, bilateral, early dry stage: Secondary | ICD-10-CM | POA: Diagnosis not present

## 2019-07-11 ENCOUNTER — Ambulatory Visit (INDEPENDENT_AMBULATORY_CARE_PROVIDER_SITE_OTHER): Payer: Medicare Other

## 2019-07-11 ENCOUNTER — Other Ambulatory Visit: Payer: Self-pay

## 2019-07-11 ENCOUNTER — Telehealth: Payer: Self-pay | Admitting: Internal Medicine

## 2019-07-11 VITALS — BP 118/70 | HR 64 | Temp 98.2°F | Resp 16 | Ht 68.0 in | Wt 196.0 lb

## 2019-07-11 DIAGNOSIS — Z0000 Encounter for general adult medical examination without abnormal findings: Secondary | ICD-10-CM | POA: Diagnosis not present

## 2019-07-11 MED ORDER — ZOSTER VAC RECOMB ADJUVANTED 50 MCG/0.5ML IM SUSR: 0.5000 mL | Freq: Once | INTRAMUSCULAR | 1 refills | Status: AC

## 2019-07-11 NOTE — Telephone Encounter (Signed)
  Patient states health coach advised him to get shingles vaccine Patient is requesting order for Shingix to be faxed to [name]

## 2019-07-11 NOTE — Telephone Encounter (Signed)
Pt informed that vaccine has been sent.

## 2019-07-11 NOTE — Progress Notes (Signed)
Subjective:   [name] [name] is a 79 y.o. male who presents for Medicare Annual/Subsequent preventive examination.  Review of Systems:  No ROS. Medicare Wellness Visit. Additional risk factors are reflected in social history. Cardiac Risk Factors include: advanced age (>100men, >16 women);family history of premature cardiovascular disease;hypertension;male gender  Sleep Patterns: No sleep issues, feels rested on waking and sleeps 8 hours nightly; gets up once to void. Home Safety/Smoke Alarms: Feels safe in home; uses home alarm. Smoke alarms in place. Living environment: Lives in a 2-story home with wife; 2 sons and grandchildren; no needs for DME, good support system. Seat Belt Safety/Bike Helmet: Wears seat belt.    Objective:    Vitals: BP 118/70   Pulse 64   Temp 98.2 F (36.8 C)   Resp 16   Ht 5\' 8"  (1.727 m)   Wt 196 lb (88.9 kg)   SpO2 96%   BMI 29.80 kg/m   Body mass index is 29.8 kg/m.  Advanced Directives 07/11/2019 06/27/2018 04/03/2018 04/03/2018 03/29/2018 08/24/2017 06/08/2017  Does Patient Have a Medical Advance Directive? Yes Yes Yes Yes Yes Yes Yes  Type of Paramedic of [place];Living will [place];Living will [place];Living will [place];Living will Living will;Healthcare Power of [place];Living will [place];Living will  Does patient want to make changes to medical advance directive? No - Patient declined - No - Patient declined No - Patient declined No - Patient declined - No - Patient declined  Copy of [place] in Chart? No - copy requested No - copy requested No - copy requested No - copy requested - Yes No - copy requested  Would patient like information on creating a medical advance directive? - - - - - - No - Patient declined  Pre-existing out of facility DNR order (yellow form or pink MOST form) - - - - - -  -    Tobacco Social History   Tobacco Use  Smoking Status Never Smoker  Smokeless Tobacco Never Used     Counseling given: No   Clinical Intake:  Pre-visit preparation completed: Yes  [name] : No/denies [name] [name] Score: 0-No [name]     BMI - recorded: 29.8 Nutritional Status: BMI 25 -29 Overweight Nutritional Risks: None Diabetes: No  How often do you need to have someone help you when you read instructions, pamphlets, or other written materials from your doctor or pharmacy?: 1 - Never What is the last grade level you completed in school?: 3years of Graduate School  Interpreter Needed?: No  Information entered by :: [name] Fissel N. Lowell Guitar, LPN  Past Medical History:  Diagnosis Date  . Acute rheumatic heart disease, unspecified    childhood, age  84 & 51  . Acute rheumatic pericarditis   . Atrial fibrillation ([place])    history  . Diverticulosis   . Dysrhythmia   . 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 External hemorrhoids without mention of complication   . 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 Lesion of ulnar nerve   . 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 Other and unspecified hyperlipidemia   . Pre-diabetes   . Previous back surgery 1978, jan 2007  . Psychosexual dysfunction with inhibited sexual excitement   . SOB (shortness of breath)    "with heavy exercise"  . Stroke ([place]) 08/2013   "I WAS  IN AFIB AND THREW A CLOT, THE EFFECTS WERE TRANSITORY AND DIDNT LAST BUT FOR 30 MINUTES"   . Thoracic aortic aneurysm [facility])    Past Surgical History:  Procedure Laterality Date  . AORTIC AND MITRAL VALVE REPLACEMENT     09/2004  . CARDIOVERSION     3 times from 2004-2006  . CARDIOVERSION N/A 09/26/2013   Procedure: CARDIOVERSION;  Surgeon: [name] Dresser, MD;  Location: [place];  Service: Cardiovascular;  Laterality: N/A;  . CARDIOVERSION N/A 06/19/2014   Procedure: CARDIOVERSION;  Surgeon: [name] Pain, MD;  Location: [place];  Service:  Cardiovascular;  Laterality: N/A;  . CARDIOVERSION N/A 04/24/2017   Procedure: CARDIOVERSION;  Surgeon: [name] Dresser, MD;  Location: [facility] ENDOSCOPY;  Service: Cardiovascular;  Laterality: N/A;  . CARDIOVERSION N/A 06/08/2017   Procedure: CARDIOVERSION;  Surgeon: [name] Casino, MD;  Location: [facility] ENDOSCOPY;  Service: Cardiovascular;  Laterality: N/A;  . CARDIOVERSION N/A 08/24/2017   Procedure: CARDIOVERSION;  Surgeon: [name] Latch, MD;  Location: [facility] ENDOSCOPY;  Service: Cardiovascular;  Laterality: N/A;  . CARDIOVERSION N/A 02/14/2018   Procedure: CARDIOVERSION;  Surgeon: [name] Dresser, MD;  Location: [facility] ENDOSCOPY;  Service: Cardiovascular;  Laterality: N/A;  . COLONOSCOPY    . laminectomies     10/1975 and in 03/2005  . TONSILLECTOMY     1950  . TOTAL HIP ARTHROPLASTY Right 04/03/2018   Procedure: TOTAL HIP ARTHROPLASTY ANTERIOR APPROACH;  Surgeon: [name] Cancel, MD;  Location: [facility] ORS;  Service: Orthopedics;  Laterality: Right;  70 mins   Family History  Problem Relation Age of Onset  . Macular degeneration Mother        macular degeneration  . Cancer Father        intestinal/GI  . Diabetes Paternal Aunt   . Heart attack Maternal Grandfather   . Diabetes Brother    Social History   Socioeconomic History  . Marital status: Married    Spouse name: Not on file  . Number of children: 2  . Years of education: BS  . Highest education level: Not on file  Occupational History  . Occupation: retired  Tobacco Use  . Smoking status: Never Smoker  . Smokeless tobacco: Never Used  Substance and Sexual Activity  . Alcohol use: Yes    Alcohol/week: 3.0 standard drinks    Types: 1 Glasses of wine, 1 Cans of beer, 1 Shots of liquor per week    Comment: wine occasionally  . Drug use: No  . Sexual activity: Yes    Partners: Female  Other Topics Concern  . Not on file  Social History Narrative   [name] - BS, [place]. married - '67. 2 sons - '74, '77  one son is a Nurse, learning disability  for [place]; 4 grandchildren. 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 marriage in good health. End-of-life: discussed issues and provided packet with the MOST form and out of facility order.   Social Determinants of Health   Financial Resource Strain:   . Difficulty of Paying Living Expenses:   Food Insecurity:   . Worried About Charity fundraiser in the Last Year:   . Arboriculturist in the Last Year:   Transportation Needs:   . Film/video editor (Medical):   [name] Lack of Transportation (Non-Medical):   Physical Activity:   . Days of Exercise per Week:   . Minutes of Exercise per Session:  Stress:   . Feeling of Stress :   Social Connections:   . Frequency of Communication with Friends and Family:   . Frequency of Social Gatherings with Friends and Family:   . Attends Religious Services:   . Active Member of Clubs or Organizations:   . Attends Archivist Meetings:   [name] Marital Status:     Outpatient Encounter Medications as of 07/11/2019  Medication Sig  . atorvastatin (LIPITOR) 80 MG tablet Take 1 tablet (80 mg total) by mouth every evening.  . clobetasol cream (TEMOVATE) [id number] % Apply 1 application topically daily as needed (for skin irritation).   . Coenzyme Q10 (CO Q-10) 300 MG CAPS Take 300 mg by mouth daily.  . Cyanocobalamin (VITAMIN B-12) 5000 MCG SUBL Place 5,000 mcg under the tongue daily at 2 PM.   . docusate sodium (COLACE) 100 MG capsule Take 1 capsule (100 mg total) by mouth 2 (two) times daily.  [name] dofetilide (TIKOSYN) 500 MCG capsule TAKE 1 CAPSULE BY MOUTH TWICE A DAY  . finasteride (PROSCAR) 5 MG tablet Take 5 mg by mouth daily.  . fish oil-omega-3 fatty acids 1000 MG capsule Take 1 g by mouth 2 (two) times daily.   . furosemide (LASIX) 20 MG tablet TAKE 1 TABLET ONCE DAILY.  [name] Glucosamine-Chondroitin (COSAMIN DS PO) Take 1 tablet by mouth 2 (two) times daily.  [name]  loratadine (CLARITIN) 10 MG tablet Take 10 mg by mouth daily as needed for allergies.   [name] losartan (COZAAR) 25 MG tablet Take 1 tablet (25 mg total) by mouth daily.  . metoprolol succinate (TOPROL-XL) 50 MG 24 hr tablet Take 75 mg (1.5 tabs) by mouth twice daily. Take with or immediately following a meal.  . Multiple Vitamins-Minerals (MULTIVITAL-M) TABS Take 1 tablet by mouth daily.   . potassium chloride SA (K-DUR) 20 MEQ tablet TAKE 1 TABLET ONCE DAILY.  . Probiotic Product (PROBIOTIC FORMULA PO) Take 1 capsule by mouth 2 (two) times daily.   . tamsulosin (FLOMAX) 0.4 MG CAPS capsule Take 0.4 mg by mouth 2 (two) times daily.  [name] warfarin (COUMADIN) 5 MG tablet Take as directed by [facility]mad[facility]   No facility-administered encounter medications on file as of 07/11/2019.    Activities of Daily Living In your present state of health, do you have any difficulty performing the following activities: 07/11/2019  Hearing? N  Vision? N  Difficulty concentrating or making decisions? N  Walking or climbing stairs? N  Dressing or bathing? N  Doing errands, shopping? N  Preparing Food and eating ? N  Using the Toilet? N  In the past six months, have you accidently leaked urine? N  Do you have problems with loss of bowel control? N  Managing your Medications? N  Managing your Finances? N  Housekeeping or managing your Housekeeping? N  Some recent data might be hidden    Patient Care Team: [name], MD as PCP - General (Internal Medicine)   Assessment:   This is a routine wellness examination for [name] [name].  Exercise Activities and Dietary recommendations Current Exercise Habits: Home exercise routine, Type of exercise: walking(yard work and walks half a mile per day), Time (Minutes): 30, Frequency (Times/Week): 5, Weekly Exercise (Minutes/Week): 150, Intensity: Moderate, Exercise limited by: cardiac condition(s);orthopedic condition(s)  Goals    . Patient Stated     Continue to keep moving  and be active. Stay as healthy and as independent as possible.       Fall  Risk Fall Risk  07/11/2019 06/27/2018 05/23/2017 05/22/2017 05/19/2016  Falls in the past year? 0 0 No No No  Number falls in past yr: 0 0 - - -  Injury with Fall? 0 - - - -  Risk for fall due to : Orthopedic patient - - - -  Risk for fall due to: Comment left hip [name]; seeing ortho - - - -  Follow up Falls evaluation completed;Education provided - - - -   Is the patient's home free of loose throw rugs in walkways, pet beds, electrical cords, etc?   yes      Grab bars in the bathroom? yes      Handrails on the stairs?   yes      Adequate lighting?   yes   Depression Screen PHQ 2/9 Scores 07/11/2019 06/27/2018 05/23/2017 05/22/2017  PHQ - 2 Score 0 0 0 0          Immunization History  Administered Date(s) Administered  . Fluad Quad(high Dose 65+) 12/06/2018  . Influenza Whole 12/25/2008, 01/05/2010, 12/30/2011  . Influenza, High Dose Seasonal PF 12/14/2016  . Influenza,inj,Quad PF,6+ Mos 12/19/2013  . Influenza,inj,quad, With Preservative 11/22/2017  . Influenza-Unspecified 12/14/2015  . Pneumococcal Conjugate-13 12/19/2013  . Pneumococcal Polysaccharide-23 09/24/2007, 05/22/2017  . Td 12/25/2008  . Tdap 03/22/2018  . Zoster 09/24/2007    Qualifies for Shingles Vaccine?  Yes, will check with local pharmacy  Screening Tests Health Maintenance  Topic Date Due  . COVID-19 Vaccine (1) Never done  . INFLUENZA VACCINE  10/06/2019  . TETANUS/TDAP  03/22/2028  . PNA vac Low Risk Adult  Completed   Cancer Screenings: Lung: Low Dose CT Chest recommended if Age 53-80 years, 30 pack-year currently smoking OR have quit w/in 15years. Patient does not qualify. Colorectal: Yes     Plan:     Reviewed health maintenance screenings with patient today and relevant education, vaccines, and/or referrals were provided.    Continue doing brain stimulating activities (puzzles, reading, adult coloring books, staying  active) to keep memory sharp.    Continue to eat heart healthy diet (full of fruits, vegetables, whole grains, lean protein, water--limit salt, fat, and sugar intake) and increase physical activity as tolerated.  I have personally reviewed and noted the following in the patient's chart:   . Medical and social history . Use of alcohol, tobacco or illicit drugs  . Current medications and supplements . Functional ability and status . Nutritional status . Physical activity . Advanced directives . List of other physicians . Hospitalizations, surgeries, and ER visits in previous 12 months . Vitals . Screenings to include cognitive, depression, and falls . Referrals and appointments  In addition, I have reviewed and discussed with patient certain preventive protocols, quality metrics, and best practice recommendations. A written personalized care plan for preventive services as well as general preventive health recommendations were provided to patient.     Sheral Flow, LPN  [id number]  Nurse Health Advisor

## 2019-07-11 NOTE — Patient Instructions (Addendum)
[name] [name] , Thank you for taking time to come for your Medicare Wellness Visit. I appreciate your ongoing commitment to your health goals. Please review the following plan we discussed and let me know if I can assist you in the future.   Screening recommendations/referrals: Colorectal Screening: due 08/2022  Vision and Dental Exams: Recommended annual ophthalmology exams for early detection of glaucoma and other disorders of the eye Recommended annual dental exams for proper oral hygiene  Vaccinations: Influenza vaccine: 12/06/2018 Pneumococcal vaccine: completed; Prevnar 12/19/2013, Pneumovax 3/18/201921 Tdap vaccine: 03/22/2018 Shingles vaccine: Please call your insurance company to determine your out of pocket expense for the Shingrix vaccine. You may receive this vaccine at your local pharmacy. Covid vaccine: completed, Moderna 03/2019, 04/2019 at [place] directives: Advance directives discussed with you today. Please bring a copy of your POA (Power of [place]) and/or Living Will to your next appointment.  Goals:  Recommend to drink at least 6-8 8oz glasses of water per day.  Recommend to exercise for at least 150 minutes per week.  Recommend to remove any items from the home that may cause slips or trips.  Recommend to decrease portion sizes by eating 3 small healthy meals and at least 2 healthy snacks per day.  Recommend to begin DASH diet as directed below  Recommend to continue efforts to reduce smoking habits until no longer smoking. Smoking Cessation literature is attached below.  Next appointment: Please schedule your Annual Wellness Visit with your Nurse Health Advisor in one year.  Preventive Care 77 Years and Older, Male Preventive care refers to lifestyle choices and visits with your health care provider that can promote health and wellness. What does preventive care include?  A yearly physical exam. This is also called an annual well check.  Dental  exams once or twice a year.  Routine eye exams. Ask your health care provider how often you should have your eyes checked.  Personal lifestyle choices, including:  Daily care of your teeth and gums.  Regular physical activity.  Eating a healthy diet.  Avoiding tobacco and drug use.  Limiting alcohol use.  Practicing safe sex.  Taking low doses of aspirin every day if recommended by your health care provider..  Taking vitamin and mineral supplements as recommended by your health care provider. What happens during an annual well check? The services and screenings done by your health care provider during your annual well check will depend on your age, overall health, lifestyle risk factors, and family history of disease. Counseling  Your health care provider may ask you questions about your:  Alcohol use.  Tobacco use.  Drug use.  Emotional well-being.  Home and relationship well-being.  Sexual activity.  Eating habits.  History of falls.  Memory and ability to understand (cognition).  Work and work Statistician. Screening  You may have the following tests or measurements:  Height, weight, and BMI.  Blood pressure.  Lipid and cholesterol levels. These may be checked every 5 years, or more frequently if you are over 3 years old.  Skin check.  Lung cancer screening. You may have this screening every year starting at age 18 if you have a 30-pack-year history of smoking and currently smoke or have quit within the past 15 years.  Fecal occult blood test (FOBT) of the stool. You may have this test every year starting at age 77.  Flexible sigmoidoscopy or colonoscopy. You may have a sigmoidoscopy every 5 years or a colonoscopy every 10  years starting at age 77.  Prostate cancer screening. Recommendations will vary depending on your family history and other risks.  Hepatitis C blood test.  Hepatitis B blood test.  Sexually transmitted disease (STD)  testing.  Diabetes screening. This is done by checking your blood sugar (glucose) after you have not eaten for a while (fasting). You may have this done every 1-3 years.  Abdominal aortic aneurysm (AAA) screening. You may need this if you are a current or former smoker.  Osteoporosis. You may be screened starting at age 5 if you are at high risk. Talk with your health care provider about your test results, treatment options, and if necessary, the need for more tests. Vaccines  Your health care provider may recommend certain vaccines, such as:  Influenza vaccine. This is recommended every year.  Tetanus, diphtheria, and acellular pertussis (Tdap, Td) vaccine. You may need a Td booster every 10 years.  Zoster vaccine. You may need this after age 35.  Pneumococcal 13-valent conjugate (PCV13) vaccine. One dose is recommended after age 77.  Pneumococcal polysaccharide (PPSV23) vaccine. One dose is recommended after age 77. Talk to your health care provider about which screenings and vaccines you need and how often you need them. This information is not intended to replace advice given to you by your health care provider. Make sure you discuss any questions you have with your health care provider. Document Released: 03/20/2015 Document Revised: 11/11/2015 Document Reviewed: 12/23/2014 Elsevier Interactive Patient Education  2017 [place] Prevention in the Home Falls can cause injuries. They can happen to people of all ages. There are many things you can do to make your home safe and to help prevent falls. What can I do on the outside of my home?  Regularly fix the edges of walkways and driveways and fix any cracks.  Remove anything that might make you trip as you walk through a door, such as a raised step or threshold.  Trim any bushes or trees on the path to your home.  Use bright outdoor lighting.  Clear any walking paths of anything that might make someone trip, such as  rocks or tools.  Regularly check to see if handrails are loose or broken. Make sure that both sides of any steps have handrails.  Any raised decks and porches should have guardrails on the edges.  Have any leaves, snow, or ice cleared regularly.  Use sand or salt on walking paths during winter.  Clean up any spills in your garage right away. This includes oil or grease spills. What can I do in the bathroom?  Use night lights.  Install grab bars by the toilet and in the tub and shower. Do not use towel bars as grab bars.  Use non-skid mats or decals in the tub or shower.  If you need to sit down in the shower, use a plastic, non-slip stool.  Keep the floor dry. Clean up any water that spills on the floor as soon as it happens.  Remove soap buildup in the tub or shower regularly.  Attach bath mats securely with double-sided non-slip rug tape.  Do not have throw rugs and other things on the floor that can make you trip. What can I do in the bedroom?  Use night lights.  Make sure that you have a light by your bed that is easy to reach.  Do not use any sheets or blankets that are too big for your bed. They should not hang  down onto the floor.  Have a firm chair that has side arms. You can use this for support while you get dressed.  Do not have throw rugs and other things on the floor that can make you trip. What can I do in the kitchen?  Clean up any spills right away.  Avoid walking on wet floors.  Keep items that you use a lot in easy-to-reach places.  If you need to reach something above you, use a strong step stool that has a grab bar.  Keep electrical cords out of the way.  Do not use floor polish or wax that makes floors slippery. If you must use wax, use non-skid floor wax.  Do not have throw rugs and other things on the floor that can make you trip. What can I do with my stairs?  Do not leave any items on the stairs.  Make sure that there are handrails on  both sides of the stairs and use them. Fix handrails that are broken or loose. Make sure that handrails are as long as the stairways.  Check any carpeting to make sure that it is firmly attached to the stairs. Fix any carpet that is loose or worn.  Avoid having throw rugs at the top or bottom of the stairs. If you do have throw rugs, attach them to the floor with carpet tape.  Make sure that you have a light switch at the top of the stairs and the bottom of the stairs. If you do not have them, ask someone to add them for you. What else can I do to help prevent falls?  Wear shoes that:  Do not have high heels.  Have rubber bottoms.  Are comfortable and fit you well.  Are closed at the toe. Do not wear sandals.  If you use a stepladder:  Make sure that it is fully opened. Do not climb a closed stepladder.  Make sure that both sides of the stepladder are locked into place.  Ask someone to hold it for you, if possible.  Clearly mark and make sure that you can see:  Any grab bars or handrails.  First and last steps.  Where the edge of each step is.  Use tools that help you move around (mobility aids) if they are needed. These include:  Canes.  Walkers.  Scooters.  Crutches.  Turn on the lights when you go into a dark area. Replace any light bulbs as soon as they burn out.  Set up your furniture so you have a clear path. Avoid moving your furniture around.  If any of your floors are uneven, fix them.  If there are any pets around you, be aware of where they are.  Review your medicines with your doctor. Some medicines can make you feel dizzy. This can increase your chance of falling. Ask your doctor what other things that you can do to help prevent falls. This information is not intended to replace advice given to you by your health care provider. Make sure you discuss any questions you have with your health care provider. Document Released: 12/18/2008 Document  Revised: 07/30/2015 Document Reviewed: 03/28/2014 Elsevier Interactive Patient Education  2017 [employer].

## 2019-07-24 ENCOUNTER — Other Ambulatory Visit: Payer: Self-pay

## 2019-07-24 ENCOUNTER — Ambulatory Visit (HOSPITAL_COMMUNITY)
Admission: RE | Admit: 2019-07-24 | Discharge: 2019-07-24 | Disposition: A | Discharge status: Home / Self Care | Payer: Medicare Other | Source: Ambulatory Visit | Attending: Cardiology | Admitting: Cardiology

## 2019-07-24 VITALS — BP 114/68 | HR 68 | Wt 188.6 lb

## 2019-07-24 DIAGNOSIS — I5032 Chronic diastolic (congestive) heart failure: Secondary | ICD-10-CM | POA: Insufficient documentation

## 2019-07-24 DIAGNOSIS — Z952 Presence of prosthetic heart valve: Secondary | ICD-10-CM | POA: Diagnosis not present

## 2019-07-24 DIAGNOSIS — Z7901 Long term (current) use of anticoagulants: Secondary | ICD-10-CM | POA: Diagnosis not present

## 2019-07-24 DIAGNOSIS — I4819 Other persistent atrial fibrillation: Secondary | ICD-10-CM

## 2019-07-24 DIAGNOSIS — I712 Thoracic aortic aneurysm, without rupture: Secondary | ICD-10-CM | POA: Insufficient documentation

## 2019-07-24 DIAGNOSIS — E785 Hyperlipidemia, unspecified: Secondary | ICD-10-CM | POA: Insufficient documentation

## 2019-07-24 DIAGNOSIS — Z79899 Other long term (current) drug therapy: Secondary | ICD-10-CM | POA: Diagnosis not present

## 2019-07-24 DIAGNOSIS — Z886 Allergy status to analgesic agent status: Secondary | ICD-10-CM | POA: Diagnosis not present

## 2019-07-24 DIAGNOSIS — I484 Atypical atrial flutter: Secondary | ICD-10-CM | POA: Insufficient documentation

## 2019-07-24 DIAGNOSIS — R7303 Prediabetes: Secondary | ICD-10-CM

## 2019-07-24 DIAGNOSIS — Z8673 Personal history of transient ischemic attack (TIA), and cerebral infarction without residual deficits: Secondary | ICD-10-CM | POA: Insufficient documentation

## 2019-07-24 DIAGNOSIS — Z8249 Family history of ischemic heart disease and other diseases of the circulatory system: Secondary | ICD-10-CM | POA: Diagnosis not present

## 2019-07-24 DIAGNOSIS — I110 Hypertensive heart disease with heart failure: Secondary | ICD-10-CM | POA: Diagnosis not present

## 2019-07-24 LAB — BASIC METABOLIC PANEL
Anion gap: 11 (ref 5–15)
BUN: 15 mg/dL (ref 8–23)
CO2: 23 mmol/L (ref 22–32)
Calcium: 8.9 mg/dL (ref 8.9–10.3)
Chloride: 103 mmol/L (ref 98–111)
Creatinine, Ser: 0.92 mg/dL (ref 0.61–1.24)
GFR calc Af Amer: >60 mL/min (ref >60)
GFR calc non Af Amer: >60 mL/min (ref >60)
Glucose, Bld: 140 mg/dL — ABNORMAL HIGH (ref 70–99)
Potassium: 4.3 mmol/L (ref 3.5–5.1)
Sodium: 137 mmol/L (ref 135–145)

## 2019-07-24 LAB — CBC
HCT: 40.2 % (ref 39.0–52.0)
Hemoglobin: 13 g/dL (ref 13.0–17.0)
MCH: 30.5 pg (ref 26.0–34.0)
MCHC: 32.3 g/dL (ref 30.0–36.0)
MCV: 94.4 fL (ref 80.0–100.0)
Platelets: 249 10*3/uL (ref 150–400)
RBC: 4.26 MIL/uL (ref 4.22–5.81)
RDW: 13.3 % (ref 11.5–15.5)
WBC: 6.7 10*3/uL (ref 4.0–10.5)
nRBC: 0 % (ref 0.0–0.2)

## 2019-07-24 LAB — HEMOGLOBIN A1C
Hgb A1c MFr Bld: 6.3 % — ABNORMAL HIGH (ref 4.8–5.6)
Mean Plasma Glucose: 134.11 mg/dL

## 2019-07-24 LAB — MAGNESIUM: Magnesium: 2 mg/dL (ref 1.7–2.4)

## 2019-07-24 MED ORDER — FUROSEMIDE 20 MG PO TABS: 20.0000 mg | ORAL_TABLET | Freq: Every day | ORAL | 3 refills | Status: DC

## 2019-07-24 MED ORDER — POTASSIUM CHLORIDE CRYS ER 20 MEQ PO TBCR: 20.0000 meq | EXTENDED_RELEASE_TABLET | Freq: Every day | ORAL | 3 refills | Status: DC

## 2019-07-24 NOTE — Patient Instructions (Signed)
Labs done today. We will contact you only if your labs are abnormal.  No medication changes were made. Please continue all current medications as prescribed.  Your physician recommends that you schedule a follow-up appointment in: 6 months. Please contact our office in October to schedule a November appointment.  At the [facility], you and your health needs are our priority. As part of our continuing mission to provide you with exceptional heart care, we have created designated Provider Care Teams. These Care Teams include your primary Cardiologist (physician) and Advanced Practice Providers (APPs- Physician Assistants and Nurse Practitioners) who all work together to provide you with the care you need, when you need it.   You may see any of the following providers on your designated Care Team at your next follow up: [name] Dr Glori Bickers . Dr Loralie Champagne . Darrick Grinder, NP . Lyda Jester, PA . Audry Riles, PharmD   Please be sure to bring in all your medications bottles to every appointment.

## 2019-07-25 ENCOUNTER — Telehealth (HOSPITAL_COMMUNITY): Payer: Self-pay | Admitting: *Deleted

## 2019-07-25 NOTE — Telephone Encounter (Signed)
Harvie Junior, [place]  07/25/2019 2:12 PM EDT    Pt aware and asked that I forward his labs to his pcp Dr.Thomas Jones. Labs forwarded.    Shonna Chock, [place]  [id number] 3:02 PM EDT    Left message to return call    Larey Dresser, MD  07/24/2019 2:50 PM EDT    HgbA1c mildly elevated at 6.3

## 2019-07-25 NOTE — Progress Notes (Signed)
Date:  07/25/2019   ID:  [name] [name], DOB 1940/10/25, MRN [id number]    Provider location: [place] [facility] Type of Visit: Established patient  PCP:  [name], MD  Cardiologist:  Dr. [name]   History of Present Illness: [name] [name] is a 79 y.o. 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  He also has an ascending aortic aneurysm.   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  I started him on dronedarone and coumadin with plan for cardioversion after 4 wks of therapeutic anticoagulation if he did not convert back to NSR on his own.  He went back into NSR spontaneously and later stopped coumadin. Atrial flutter recurred in 3/15 and again resolved spontaneously.  In 6/15, patient was admitted with transient dysarthria (resolved in about 10 minutes).  However, he was found to have evidence for CVA by MRI.  Therefore, he was started on coumadin. He was noted to be in atypical atrial flutter again.  Ranolazine was added to dronedarone.    In 8/15, Mr [name] [name] was seen in consultation at the [facility] clinic.  He had atrial flutter ablation at [facility] in 11/15.    He was seen in followup at [facility] in 2/16.  Ranolazine was stopped.  Echo in 2/16 showed EF 55% with stable bioprosthetic mitral and aortic valves.  CTA chest showed stable 5 cm ascending aortic aneurysm.  He went back into atypical atrial flutter in 4/16.  Ranolazine was restarted and he was cardioverted back to NSR.    Cardiolite in 12/18 showed no ischemia. He wore a 30 day monitor in 12/18 with no arrhythmia.    He went into atrial flutter again during a respiratory illness in 2/19.  On 04/24/17, he was cardioverted back to NSR.  He thinks that he went into atrial flutter again later on that day. He was back in atrial flutter at followup  appointment. He was then admitted for Tikosyn loading in 4/19 and was cardioverted back to NSR after the load.   He had atrial fibrillation again in 6/19 with DCCV.   He was seen at the [facility] in 9/19.  Echo showed normal LV and RV systolic function, bioprosthetic MV with mean gradient up to 9 mmHg, normal bioprosthetic AoV.  CTA chest showed aortic root up to 5.3 cm.   He was back in atrial flutter in 12/19, had DCCV back to NSR.  Echo in 12/19 showed EF 60-65%, normal bioprosthetic aortic valve, bioprosthetic mitral valve with mean gradient 4 mmHg and no MR, severe LAE, aortic root 4.8 cm.   He went to the [facility] in 3/20.  Echo at that time showed EF 60-65% with stable bioprosthetic mitral and aortic valves (mean gradient 7 mmHg across MV and 9 mmHg across AoV).  CTA showed 5.0 cm aortic root and ascending aorta, stable.  He returned to the [facility] in 7/20.  He had atrial fibrillation/flutter ablation again and thinks he has been in NSR since that time.  CTA chest showed 5.1 cm ascending thoracic aorta.   He returns for followup of diastolic CHF, atrial fibrillation, and bioprosthetic AVR and MVR.  He is in NSR today, and he feels like he has been in NSR since 7/20 ablation.  He has been trying to  lose weight, and is down 21 lbs today. He walks about 1.5 miles on most days without exertional dyspnea or chest pain.  No lightheadedness.  No palpitations.   ECG (12/12/11): Atypical atrial flutter versus atrial tachycardia with rate around 100 bpm, QTc normal.  ECG (12/29/11): NSR, 1st degree AV block PR 242 msec.    ECG (12/13): NSR, 1st degree AV block PR 244 msec    ECG (10/14): NSR, 1st degree AV block ECG (3/15): Atypical atrial flutter with rate 87, inferior T wave inversions ECG (6/15): Atypical atrial flutter with rate 77 ECG (8/15): atypical atrial flutter rate 82 ECG (10/15): NSR, 1st degree AV block ECG (12/15): NSR, 1st degree AV block, QTc 410  msec ECG (5/16): NSR, 1st degree AV block, QTc 419 msec ECG (5/17): NSR, 1st degree AV block 270 msec, QTc 433 ECG (2/18): NSR, 1st degree AVB, nonspecific ST-T changes, QTc 435 msec ECG (11/18): NSR, 1st degree AVB, QTc 424 msec ECG (2/19, personally reviewed): atypical atrial flutter at 87, QTc 442 msec ECG (4/19, personally reviewed): NSR, 1st degree AVB, QTc 453 msec ECG (10/19, personally reviewed): NSR, 1st degree AVB, QTc 448 msec ECG (1/20, personally reviewed): atypical atrial flutter in 90s, QTc 472 msec.  ECG (8/20): NSR, 1st degree AVB, QTc 448 msec  ECG (1/21, personally reviewed): NSR, 1st degree AVB, QTc 434 msec ECG (5/21, personally reviewed): NSR, 1st degree AVB, QTc 447  Labs (3/14): LDL 64, TSH normal, K 3.9, creatinine 1.0     Labs (6/15): LDL 54, HDL 48, creatinine 0.95   Labs (9/15): LDL 54, HDL 25 Labs (10/15): K 4.5, creatinine 1.3, AST 45, ALT 64 Labs (4/16): K 4.3, creatinine 1.28, HCT 40.7 Labs (5/17): K 4.1, creatinine 1.16, LDL 66, HDL 47 Labs (2/18): LDL 42, HDL 43, K 4.4, creatinine 1.48 Labs (3/18): hgb 13.8 Labs (2/19): LDL 59 Labs (4/19): K 3.9, creatinine 0.97, Mg 2.0 Labs (6/19): K 4.4, creatinine 0.9 Labs (10/19): LDL 84 Labs (12/19): K 4.2, creatinine 1.13, hgb 13.9 Labs (3/20): K 4.1, creatinine 0.98 Labs (6/20): K 4.1, creatinine 0.91, LDL 103  Labs (9/20): K 4, creatinine 0.95 Labs (1/21): K 4, creatinine 0.94, LDL 80                                                                                                                                 PMH: 1. Atrial fibrillation: s/p MAZE and LAA ligation in 2006.  No documented atrial fibrillation since operation.  2. Atypical atrial flutter: 10/13, 3/15, and again in 6/15.  Recurrent 4/16 with DCCV.  - Event monitor (12/18): no significant arrhythmia.  - Recurrence in 2/19: DCCV to NSR but quickly recurred.  - Admitted in 4/19 for Tikosyn loading and cardioverted to NSR.  - DCCV to NSR in  6/19.  - DCCV to NSR in 12/19. - 7/20 redo atrial flutter/fibrillation ablation.   3. 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   - Echo 5/11 with EF 60-65%, normal bioprosthetic aortic valve, normal bioprosthetic mitral valve, moderate LAE.   - Echo 11/12 at [facility] looked ok per report.  - Echo (10/13) with EF 55-60%, mild LVH, normal bioprosthetic aortic and mitral valves, moderate LAE, PA systolic pressure 35 mmHg.   - Echo (6/15) with EF 60%, moderate LVH, normal-appearing bioprosthetic MV and AoV, PA systolic pressure 32 mmHg.   - Echo 9/15 [facility]) with EF 55%, mild LVH, PASP 52 mmHg, severe LAE, bioprosthetic mitral valve with no MR and mean gradient 11 mmHg, moderate TR, bioprosthetic aortic valve with no AI and mean gradient 9 mmHg, dilated ascending aorta to 5 cm.   - Echo (2/16, [facility]) with EF 55%, stable bioprosthetic aortic and mitral valves (AoV mean gradient 7 mmHg, MV mean gradient 4 mmHg), normal RV size and systolic function.  - Echo (12/16, [facility]) with EF 58%, mild RV dilation with low normal systolic function, PA systolic pressure 34 mmHg, bioprosthetic MV and AoV functioning normally.  - Echo (5/18, [facility]) with EF 60-65%, RV normal size and systolic function, moderate LAE, bioprosthetic MV mean gradient 6 with no MR, bioprosthetic aortic valve with mean gradient 11 and no AI, PASP 41 mmHg.  - Echo (2/19): EF 60-65%, moderate LVH, bioprosthetic aortic and mitral valves functioning normally, PASP 23 mmHg, aortic root 4.5 cm.  - Echo (9/19): EF 56%, normal RV size and systolic function, bioprosthetic MV with mean gradient 9 mmHg, trivial MR, bioprosthetic aortic valve with mean gradient 12 mmHg, PASP 51 mmHg.  - Echo (12/19): EF 60-65%, normal bioprosthetic aortic valve, bioprosthetic mitral valve with mean gradient 4 mmHg and no MR, severe LAE, aortic root 4.8 cm.  -  Echo (3/20, [facility]): EF 60-65%, normal RV, severe LAE, 5.0 cm ascending aorta, bioprosthetic MV with mean gradient 7 mmHg, bioprosthetic aortic valve with mean gradient 9 mmHg.  - Echo (7/20, [facility]): EF 55-60%, moderate septal hypertrophy, normal RV size and systolic function, bioprosthetic MV with trace MR and mean gradient 4, bioprosthetic aortic valve with mean gradient 14 mmHg, severe LAE.  4. Hyperlipidemia 5. L-spine surgery 6. LHC with no significant coronary disease in 2006 pre-op.  - Cardiolite (12/18): No ischemia.  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  CTA chest 9/15 with dilated sinuses of valsalva to 5.0 cm and ascending aorta 4.9 cm. CTA chest (2/16) with 5.0 cm ascending aorta.  CTA chest (12/16) with 4.9 cm aortic root at SOV, 4.8 cm ascending aorta.  - CT chest (5/18, [facility]): 5.0 cm aortic root, 5.0 cm ascending aorta.  - CTA chest (10/19, [facility]): 5.3 cm aortic root, 5.2 cm ascending aorta.  - CTA chest (3/20, [facility]): 5.0 cm aortic root and ascending aorta - CTA chest (7/20, [facility]): 5.1 cm ascending aorta 9. Chronic diastolic CHF  Current Outpatient Medications  Medication Sig Dispense Refill  . atorvastatin (LIPITOR) 80 MG tablet Take 1 tablet (80 mg total) by mouth every evening. 90 tablet 3  . clobetasol cream (TEMOVATE) [id number] % Apply 1 application topically daily as needed (for skin irritation).     . Coenzyme Q10 (CO Q-10) 300 MG CAPS Take 300 mg by mouth daily.    . Cyanocobalamin (VITAMIN B-12) 5000 MCG SUBL Place 5,000 mcg under the tongue daily at 2 PM.     .  docusate sodium (COLACE) 100 MG capsule Take 1 capsule (100 mg total) by mouth 2 (two) times daily. 10 capsule 0  . dofetilide (TIKOSYN) 500 MCG capsule TAKE 1 CAPSULE BY MOUTH TWICE A DAY 180 capsule 0  . finasteride (PROSCAR) 5 MG tablet Take 5 mg by  mouth daily.    . fish oil-omega-3 fatty acids 1000 MG capsule Take 1 g by mouth 2 (two) times daily.     . furosemide (LASIX) 20 MG tablet Take 1 tablet (20 mg total) by mouth daily. 90 tablet 3  . Glucosamine-Chondroitin (COSAMIN DS PO) Take 1 tablet by mouth 2 (two) times daily.    [name] loratadine (CLARITIN) 10 MG tablet Take 10 mg by mouth daily as needed for allergies.     [name] losartan (COZAAR) 25 MG tablet Take 1 tablet (25 mg total) by mouth daily. 90 tablet 3  . metoprolol succinate (TOPROL-XL) 50 MG 24 hr tablet Take 75 mg (1.5 tabs) by mouth twice daily. Take with or immediately following a meal. 270 tablet 3  . Multiple Vitamins-Minerals (MULTIVITAL-M) TABS Take 1 tablet by mouth daily.     . potassium chloride SA (KLOR-CON) 20 MEQ tablet Take 1 tablet (20 mEq total) by mouth daily. 90 tablet 3  . Probiotic Product (PROBIOTIC FORMULA PO) Take 1 capsule by mouth 2 (two) times daily.     . tamsulosin (FLOMAX) 0.4 MG CAPS capsule Take 0.4 mg by mouth 2 (two) times daily.    [name] warfarin (COUMADIN) 5 MG tablet Take as directed by [facility] 120 tablet 1   No current facility-administered medications for this encounter.    Allergies:   Naproxen   Social History:  The patient  reports that he has never smoked. He has never used smokeless tobacco. He reports current alcohol use of about 3.0 standard drinks of alcohol per week. He reports that he does not use drugs.   Family History:  The patient's family history includes Cancer in his father; Diabetes in his brother and paternal aunt; Heart attack in his maternal grandfather; Macular degeneration in his mother.   ROS:  Please see the history of present illness.   All other systems are personally reviewed and negative.   Exam:   BP 114/68   Pulse 68   Wt 85.5 kg (188 lb 9.6 oz)   SpO2 96%   BMI 28.68 kg/m  General: NAD Neck: No JVD, no thyromegaly or thyroid nodule.  Lungs: Clear to auscultation bilaterally with normal respiratory  effort. CV: Nondisplaced PMI.  Heart regular S1/S2, no S3/S4, 2/6 SEM RUSB.  No peripheral edema.  No carotid bruit.  Normal pedal pulses.  Abdomen: Soft, nontender, no hepatosplenomegaly, no distention.  Skin: Intact without lesions or rashes.  Neurologic: Alert and oriented x 3.  Psych: Normal affect. Extremities: No clubbing or cyanosis.  HEENT: Normal.   Recent Labs: 07/24/2019: BUN 15; Creatinine, Ser 0.92; Hemoglobin 13.0; Magnesium 2.0; Platelets 249; Potassium 4.3; Sodium 137  Personally reviewed   Wt Readings from Last 3 Encounters:  07/24/19 85.5 kg (188 lb 9.6 oz)  07/11/19 88.9 kg (196 lb)  03/22/19 94.9 kg (209 lb 3.2 oz)     ASSESSMENT AND PLAN:  1. Atypical atrial flutter: Patient had a MAZE procedure with LAA ligation at the time of his valve replacement.  Since then, he has had occasional episodes of atypical atrial flutter. He had episodes in 10/13 and 3/15 that converted back to NSR on dronedarone and ranolazine. He was  noted to be back in atrial flutter in 6/15 in the setting of a CVA.  He had ablation at [facility] in 11/15.  Atrial flutter recurred after ranolazine was stopped and he had DCCV again in 4/16.  Atrial flutter recurred in 2/19, DCCV back to NSR but atrial flutter quickly recurred. Tikosyn was then loaded and he was cardioverted back to NSR in 4/19.  He required DCCV again in 6/19 and in 12/19 (required 4 shocks).  He went back to atrial flutter about 3 days after 12/19 DCCV.  He had redo flutter/fibrillation ablation at [facility] in 7/20 and has been in NSR since that time.  - Continue coumadin.  - Continue current Toprol XL. - Continue Tikosyn.  ECG showed stable QTc today.  Check BMET, Mg.   2. 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  Last echo at [facility] in 7/20 showed normal bioprosthetic aortic valve, with stable mean gradient 14 mmHg across the mitral valve.   - He will need repeat echo in 7/21,  this will be done during his visit to the [facility]  3. HTN: BP needs good control given ascending aortic aneurysm. BP at goal. - Continue losartan and Toprol XL.  4. Hyperlipidemia: Good lipids in 1/21, continue atorvastatin.      5. CVA: Suspect this was related to atrial flutter (despite LA appendage ligation).  He is now on coumadin. No residual neurologic deficits. Continue long-term anticoagulation. - If has to stop coumadin in the future, should get Lovenox bridging.  - CBC today.  6. Thoracic aortic aneurysm: Dilated aortic root at sinuses of valsalva and ascending aorta to 5.1 cm, last CTA in 7/20.   - Close followup, may need aortic root/ascending aorta replacement if ascending aorta expands further but not yet.  Getting serial CTs. Next CTA chest will be due in 7/21, will have this done when he goes to the [facility] this summer.    - Continue Toprol XL given some evidence that beta blockers may attenuate thoracic aortic aneurysm growth.  7. Chronic diastolic CHF: Patient is not volume overloaded on exam.   - Continue Lasix 20 mg daily, will be getting BMET.  Followup in 6 months.    Signed, Loralie Champagne, MD  07/25/2019  [place] [address] [facility] [place] [address] [phone] (office) [phone] (fax)

## 2019-07-25 NOTE — Telephone Encounter (Signed)
-----   Message from Larey Dresser, MD sent at 07/24/2019  2:50 PM EDT ----- HgbA1c mildly elevated at 6.3

## 2019-07-26 ENCOUNTER — Other Ambulatory Visit: Payer: Self-pay

## 2019-07-26 ENCOUNTER — Ambulatory Visit (INDEPENDENT_AMBULATORY_CARE_PROVIDER_SITE_OTHER): Payer: Medicare Other | Admitting: *Deleted

## 2019-07-26 DIAGNOSIS — I4892 Unspecified atrial flutter: Secondary | ICD-10-CM

## 2019-07-26 DIAGNOSIS — Z5181 Encounter for therapeutic drug level monitoring: Secondary | ICD-10-CM

## 2019-07-26 DIAGNOSIS — I482 Chronic atrial fibrillation, unspecified: Secondary | ICD-10-CM | POA: Diagnosis not present

## 2019-07-26 LAB — POCT INR: INR: 2.8 (ref 2.0–3.0)

## 2019-07-26 NOTE — Patient Instructions (Signed)
Description   °Continue taking 1.5 tablets daily except for 1 tablet on Sunday, Tuesday and Thursday. Recheck INR in 6 weeks. Call and let us know if scheduled for any procedures [phone] or medication changes.  °  ° °

## 2019-08-19 DIAGNOSIS — H5212 Myopia, left eye: Secondary | ICD-10-CM | POA: Diagnosis not present

## 2019-08-19 DIAGNOSIS — H2512 Age-related nuclear cataract, left eye: Secondary | ICD-10-CM | POA: Diagnosis not present

## 2019-08-19 DIAGNOSIS — H52202 Unspecified astigmatism, left eye: Secondary | ICD-10-CM | POA: Diagnosis not present

## 2019-08-20 DIAGNOSIS — Z961 Presence of intraocular lens: Secondary | ICD-10-CM | POA: Diagnosis not present

## 2019-08-20 DIAGNOSIS — H2511 Age-related nuclear cataract, right eye: Secondary | ICD-10-CM | POA: Diagnosis not present

## 2019-09-02 DIAGNOSIS — H2511 Age-related nuclear cataract, right eye: Secondary | ICD-10-CM | POA: Diagnosis not present

## 2019-09-02 DIAGNOSIS — H52223 Regular astigmatism, bilateral: Secondary | ICD-10-CM | POA: Diagnosis not present

## 2019-09-02 DIAGNOSIS — Z961 Presence of intraocular lens: Secondary | ICD-10-CM | POA: Diagnosis not present

## 2019-09-02 DIAGNOSIS — H524 Presbyopia: Secondary | ICD-10-CM | POA: Diagnosis not present

## 2019-09-06 ENCOUNTER — Ambulatory Visit (INDEPENDENT_AMBULATORY_CARE_PROVIDER_SITE_OTHER): Payer: Medicare Other | Admitting: *Deleted

## 2019-09-06 ENCOUNTER — Other Ambulatory Visit: Payer: Self-pay

## 2019-09-06 DIAGNOSIS — I482 Chronic atrial fibrillation, unspecified: Secondary | ICD-10-CM | POA: Diagnosis not present

## 2019-09-06 DIAGNOSIS — I4892 Unspecified atrial flutter: Secondary | ICD-10-CM | POA: Diagnosis not present

## 2019-09-06 DIAGNOSIS — Z5181 Encounter for therapeutic drug level monitoring: Secondary | ICD-10-CM | POA: Diagnosis not present

## 2019-09-06 LAB — POCT INR: INR: 2.2 (ref 2.0–3.0)

## 2019-09-06 NOTE — Patient Instructions (Signed)
Description   Continue taking 1.5 tablets daily except for 1 tablet on Sunday, Tuesday and Thursday. Recheck INR in 6 weeks. Call and let us know if scheduled for any procedures [phone] or medication changes.

## 2019-09-30 DIAGNOSIS — I712 Thoracic aortic aneurysm, without rupture: Secondary | ICD-10-CM | POA: Diagnosis not present

## 2019-09-30 DIAGNOSIS — Z1389 Encounter for screening for other disorder: Secondary | ICD-10-CM | POA: Diagnosis not present

## 2019-09-30 DIAGNOSIS — Z8679 Personal history of other diseases of the circulatory system: Secondary | ICD-10-CM | POA: Diagnosis not present

## 2019-09-30 DIAGNOSIS — Z952 Presence of prosthetic heart valve: Secondary | ICD-10-CM | POA: Diagnosis not present

## 2019-09-30 DIAGNOSIS — I700 Atherosclerosis of aorta: Secondary | ICD-10-CM | POA: Diagnosis not present

## 2019-09-30 DIAGNOSIS — I361 Nonrheumatic tricuspid (valve) insufficiency: Secondary | ICD-10-CM | POA: Diagnosis not present

## 2019-10-01 DIAGNOSIS — Z952 Presence of prosthetic heart valve: Secondary | ICD-10-CM | POA: Diagnosis not present

## 2019-10-01 DIAGNOSIS — I484 Atypical atrial flutter: Secondary | ICD-10-CM | POA: Diagnosis not present

## 2019-10-01 DIAGNOSIS — I059 Rheumatic mitral valve disease, unspecified: Secondary | ICD-10-CM | POA: Diagnosis not present

## 2019-10-01 DIAGNOSIS — Z9889 Other specified postprocedural states: Secondary | ICD-10-CM | POA: Diagnosis not present

## 2019-10-01 DIAGNOSIS — I359 Nonrheumatic aortic valve disorder, unspecified: Secondary | ICD-10-CM | POA: Diagnosis not present

## 2019-10-01 DIAGNOSIS — I712 Thoracic aortic aneurysm, without rupture: Secondary | ICD-10-CM | POA: Diagnosis not present

## 2019-10-22 ENCOUNTER — Other Ambulatory Visit: Payer: Self-pay

## 2019-10-22 ENCOUNTER — Ambulatory Visit (INDEPENDENT_AMBULATORY_CARE_PROVIDER_SITE_OTHER): Payer: Medicare Other | Admitting: *Deleted

## 2019-10-22 DIAGNOSIS — Z7901 Long term (current) use of anticoagulants: Secondary | ICD-10-CM | POA: Diagnosis not present

## 2019-10-22 DIAGNOSIS — I482 Chronic atrial fibrillation, unspecified: Secondary | ICD-10-CM

## 2019-10-22 DIAGNOSIS — I4892 Unspecified atrial flutter: Secondary | ICD-10-CM | POA: Diagnosis not present

## 2019-10-22 DIAGNOSIS — Z5181 Encounter for therapeutic drug level monitoring: Secondary | ICD-10-CM | POA: Diagnosis not present

## 2019-10-22 LAB — POCT INR: INR: 3.4 — AB (ref 2.0–3.0)

## 2019-10-22 NOTE — Patient Instructions (Signed)
Description   Do not take any Warfarin today then continue taking 1.5 tablets daily except for 1 tablet on Sunday, Tuesday and Thursday. Recheck INR in 4 weeks. Call and let us know if scheduled for any procedures [phone] or medication changes.

## 2019-11-28 ENCOUNTER — Other Ambulatory Visit: Payer: Self-pay

## 2019-11-28 ENCOUNTER — Ambulatory Visit (INDEPENDENT_AMBULATORY_CARE_PROVIDER_SITE_OTHER): Payer: Medicare Other

## 2019-11-28 DIAGNOSIS — I4892 Unspecified atrial flutter: Secondary | ICD-10-CM

## 2019-11-28 DIAGNOSIS — Z7901 Long term (current) use of anticoagulants: Secondary | ICD-10-CM | POA: Diagnosis not present

## 2019-11-28 DIAGNOSIS — Z5181 Encounter for therapeutic drug level monitoring: Secondary | ICD-10-CM | POA: Diagnosis not present

## 2019-11-28 DIAGNOSIS — I482 Chronic atrial fibrillation, unspecified: Secondary | ICD-10-CM | POA: Diagnosis not present

## 2019-11-28 LAB — POCT INR: INR: 2.2 (ref 2.0–3.0)

## 2019-11-28 NOTE — Patient Instructions (Signed)
Description   Continue on same dosage 1.5 tablets daily except for 1 tablet on Sundays, Tuesdays and Thursdays. Recheck INR in 4 weeks. Call and let us know if scheduled for any procedures [phone] or medication changes.

## 2019-11-29 DIAGNOSIS — R972 Elevated prostate specific antigen [PSA]: Secondary | ICD-10-CM | POA: Diagnosis not present

## 2019-12-06 DIAGNOSIS — N401 Enlarged prostate with lower urinary tract symptoms: Secondary | ICD-10-CM | POA: Diagnosis not present

## 2019-12-06 DIAGNOSIS — R3914 Feeling of incomplete bladder emptying: Secondary | ICD-10-CM | POA: Diagnosis not present

## 2019-12-06 DIAGNOSIS — R972 Elevated prostate specific antigen [PSA]: Secondary | ICD-10-CM | POA: Diagnosis not present

## 2019-12-24 ENCOUNTER — Ambulatory Visit (INDEPENDENT_AMBULATORY_CARE_PROVIDER_SITE_OTHER): Payer: Medicare Other | Admitting: *Deleted

## 2019-12-24 ENCOUNTER — Other Ambulatory Visit: Payer: Self-pay

## 2019-12-24 DIAGNOSIS — I482 Chronic atrial fibrillation, unspecified: Secondary | ICD-10-CM

## 2019-12-24 DIAGNOSIS — I4892 Unspecified atrial flutter: Secondary | ICD-10-CM | POA: Diagnosis not present

## 2019-12-24 DIAGNOSIS — Z7901 Long term (current) use of anticoagulants: Secondary | ICD-10-CM | POA: Diagnosis not present

## 2019-12-24 DIAGNOSIS — Z5181 Encounter for therapeutic drug level monitoring: Secondary | ICD-10-CM | POA: Diagnosis not present

## 2019-12-24 DIAGNOSIS — Z23 Encounter for immunization: Secondary | ICD-10-CM | POA: Diagnosis not present

## 2019-12-24 LAB — POCT INR: INR: 2.7 (ref 2.0–3.0)

## 2019-12-24 NOTE — Patient Instructions (Signed)
Description   Continue taking Warfarin 1.5 tablets daily except for 1 tablet on Sundays, Tuesdays and Thursdays. Recheck INR in 6 weeks. Call and let us know if scheduled for any procedures [phone] or medication changes.

## 2019-12-25 ENCOUNTER — Other Ambulatory Visit (HOSPITAL_COMMUNITY): Payer: Self-pay | Admitting: Cardiology

## 2020-01-02 DIAGNOSIS — Z23 Encounter for immunization: Secondary | ICD-10-CM | POA: Diagnosis not present

## 2020-02-04 ENCOUNTER — Ambulatory Visit (INDEPENDENT_AMBULATORY_CARE_PROVIDER_SITE_OTHER): Payer: Medicare Other

## 2020-02-04 ENCOUNTER — Other Ambulatory Visit: Payer: Self-pay

## 2020-02-04 DIAGNOSIS — I4892 Unspecified atrial flutter: Secondary | ICD-10-CM | POA: Diagnosis not present

## 2020-02-04 DIAGNOSIS — Z7901 Long term (current) use of anticoagulants: Secondary | ICD-10-CM | POA: Diagnosis not present

## 2020-02-04 DIAGNOSIS — Z5181 Encounter for therapeutic drug level monitoring: Secondary | ICD-10-CM

## 2020-02-04 DIAGNOSIS — I482 Chronic atrial fibrillation, unspecified: Secondary | ICD-10-CM

## 2020-02-04 LAB — POCT INR: INR: 2.4 (ref 2.0–3.0)

## 2020-02-04 NOTE — Patient Instructions (Signed)
Description   Continue taking Warfarin 1.5 tablets daily except for 1 tablet on Sundays, Tuesdays and Thursdays. Recheck INR in 6 weeks. Call and let us know if scheduled for any procedures [phone] or medication changes.

## 2020-03-17 ENCOUNTER — Ambulatory Visit (INDEPENDENT_AMBULATORY_CARE_PROVIDER_SITE_OTHER): Payer: Medicare Other | Admitting: *Deleted

## 2020-03-17 ENCOUNTER — Other Ambulatory Visit: Payer: Self-pay

## 2020-03-17 DIAGNOSIS — Z5181 Encounter for therapeutic drug level monitoring: Secondary | ICD-10-CM

## 2020-03-17 DIAGNOSIS — I4892 Unspecified atrial flutter: Secondary | ICD-10-CM | POA: Diagnosis not present

## 2020-03-17 DIAGNOSIS — I482 Chronic atrial fibrillation, unspecified: Secondary | ICD-10-CM

## 2020-03-17 LAB — POCT INR: INR: 2.2 (ref 2.0–3.0)

## 2020-03-17 NOTE — Patient Instructions (Signed)
Description   Continue taking Warfarin 1.5 tablets daily except for 1 tablet on Sundays, Tuesdays and Thursdays. Recheck INR in 6 weeks. Call and let us know if scheduled for any procedures [phone] or medication changes.      

## 2020-03-26 ENCOUNTER — Ambulatory Visit (HOSPITAL_COMMUNITY)
Admission: RE | Admit: 2020-03-26 | Discharge: 2020-03-26 | Disposition: A | Discharge status: Home / Self Care | Payer: Medicare Other | Source: Ambulatory Visit | Attending: Cardiology | Admitting: Cardiology

## 2020-03-26 ENCOUNTER — Encounter (HOSPITAL_COMMUNITY): Payer: Self-pay | Admitting: Cardiology

## 2020-03-26 ENCOUNTER — Other Ambulatory Visit: Payer: Self-pay

## 2020-03-26 VITALS — BP 148/80 | HR 63 | Wt 197.2 lb

## 2020-03-26 DIAGNOSIS — Z886 Allergy status to analgesic agent status: Secondary | ICD-10-CM | POA: Diagnosis not present

## 2020-03-26 DIAGNOSIS — I110 Hypertensive heart disease with heart failure: Secondary | ICD-10-CM | POA: Insufficient documentation

## 2020-03-26 DIAGNOSIS — I712 Thoracic aortic aneurysm, without rupture: Secondary | ICD-10-CM | POA: Diagnosis not present

## 2020-03-26 DIAGNOSIS — Z8673 Personal history of transient ischemic attack (TIA), and cerebral infarction without residual deficits: Secondary | ICD-10-CM | POA: Diagnosis not present

## 2020-03-26 DIAGNOSIS — Z8249 Family history of ischemic heart disease and other diseases of the circulatory system: Secondary | ICD-10-CM | POA: Diagnosis not present

## 2020-03-26 DIAGNOSIS — Z953 Presence of xenogenic heart valve: Secondary | ICD-10-CM | POA: Insufficient documentation

## 2020-03-26 DIAGNOSIS — Z7901 Long term (current) use of anticoagulants: Secondary | ICD-10-CM | POA: Insufficient documentation

## 2020-03-26 DIAGNOSIS — I484 Atypical atrial flutter: Secondary | ICD-10-CM | POA: Diagnosis not present

## 2020-03-26 DIAGNOSIS — I5032 Chronic diastolic (congestive) heart failure: Secondary | ICD-10-CM | POA: Insufficient documentation

## 2020-03-26 DIAGNOSIS — Z79899 Other long term (current) drug therapy: Secondary | ICD-10-CM | POA: Insufficient documentation

## 2020-03-26 DIAGNOSIS — E785 Hyperlipidemia, unspecified: Secondary | ICD-10-CM | POA: Insufficient documentation

## 2020-03-26 DIAGNOSIS — I482 Chronic atrial fibrillation, unspecified: Secondary | ICD-10-CM | POA: Diagnosis not present

## 2020-03-26 HISTORY — DX: Heart failure, unspecified: I50.9

## 2020-03-26 LAB — CBC
HCT: 39.2 % (ref 39.0–52.0)
Hemoglobin: 13.3 g/dL (ref 13.0–17.0)
MCH: 31.7 pg (ref 26.0–34.0)
MCHC: 33.9 g/dL (ref 30.0–36.0)
MCV: 93.6 fL (ref 80.0–100.0)
Platelets: 266 10*3/uL (ref 150–400)
RBC: 4.19 MIL/uL — ABNORMAL LOW (ref 4.22–5.81)
RDW: 13.9 % (ref 11.5–15.5)
WBC: 8 10*3/uL (ref 4.0–10.5)
nRBC: 0 % (ref 0.0–0.2)

## 2020-03-26 LAB — LIPID PANEL
Cholesterol: 141 mg/dL (ref 0–200)
HDL: 44 mg/dL (ref >40)
LDL Cholesterol: 75 mg/dL (ref 0–99)
Total CHOL/HDL Ratio: 3.2 RATIO
Triglycerides: 110 mg/dL (ref <150)
VLDL: 22 mg/dL (ref 0–40)

## 2020-03-26 LAB — BASIC METABOLIC PANEL
Anion gap: 9 (ref 5–15)
BUN: 21 mg/dL (ref 8–23)
CO2: 26 mmol/L (ref 22–32)
Calcium: 9 mg/dL (ref 8.9–10.3)
Chloride: 104 mmol/L (ref 98–111)
Creatinine, Ser: 1 mg/dL (ref 0.61–1.24)
GFR, Estimated: >60 mL/min (ref >60)
Glucose, Bld: 116 mg/dL — ABNORMAL HIGH (ref 70–99)
Potassium: 4.6 mmol/L (ref 3.5–5.1)
Sodium: 139 mmol/L (ref 135–145)

## 2020-03-26 MED ORDER — WARFARIN SODIUM 5 MG PO TABS: ORAL_TABLET | ORAL | 1 refills | Status: DC

## 2020-03-26 MED ORDER — METOPROLOL SUCCINATE ER 50 MG PO TB24: ORAL_TABLET | ORAL | 3 refills | Status: DC

## 2020-03-26 MED ORDER — POTASSIUM CHLORIDE CRYS ER 20 MEQ PO TBCR: 20.0000 meq | EXTENDED_RELEASE_TABLET | Freq: Every day | ORAL | 3 refills | Status: DC

## 2020-03-26 MED ORDER — ATORVASTATIN CALCIUM 80 MG PO TABS: 80.0000 mg | ORAL_TABLET | Freq: Every evening | ORAL | 3 refills | Status: DC

## 2020-03-26 MED ORDER — DOFETILIDE 500 MCG PO CAPS: 500.0000 ug | ORAL_CAPSULE | Freq: Two times a day (BID) | ORAL | 3 refills | Status: DC

## 2020-03-26 MED ORDER — LOSARTAN POTASSIUM 25 MG PO TABS: 25.0000 mg | ORAL_TABLET | Freq: Every day | ORAL | 3 refills | Status: DC

## 2020-03-26 MED ORDER — FUROSEMIDE 20 MG PO TABS: 20.0000 mg | ORAL_TABLET | Freq: Every day | ORAL | 3 refills | Status: DC

## 2020-03-26 NOTE — Patient Instructions (Signed)
Labs done today, your results will be available in MyChart, we will contact you for abnormal readings.  Please call our office in June 2022 to schedule your follow up appointment  If you have any questions or concerns before your next appointment please send us a message through mychart or call our office at [phone].    TO LEAVE A MESSAGE FOR THE NURSE SELECT OPTION 2, PLEASE LEAVE A MESSAGE INCLUDING: . YOUR NAME . DATE OF BIRTH . CALL BACK NUMBER . REASON FOR CALL**this is important as we prioritize the call backs  YOU WILL RECEIVE A CALL BACK THE SAME DAY AS LONG AS YOU CALL BEFORE 4:00 PM  At the [facility], you and your health needs are our priority. As part of our continuing mission to provide you with exceptional heart care, we have created designated Provider Care Teams. These Care Teams include your primary Cardiologist (physician) and Advanced Practice Providers (APPs- Physician Assistants and Nurse Practitioners) who all work together to provide you with the care you need, when you need it.   You may see any of the following providers on your designated Care Team at your next follow up: . Dr Daniel Bensimhon . Dr Dalton McLean . Amy Clegg, NP . Brittainy Simmons, PA . Lauren Kemp, PharmD   Please be sure to bring in all your medications bottles to every appointment.    

## 2020-03-28 NOTE — Progress Notes (Signed)
Date:  03/28/2020   ID:  [name] [name], DOB [birth year], MRN [id number]    Provider location: [place] [facility] Type of Visit: Established patient  PCP:  [name], MD  Cardiologist:  Dr. [name]   History of Present Illness: [name] [name] is a 80 y.o. male who has a history of rheumatic fever and AI/[name] s/p bioprosthetic AVR and MVR at the [facility] as well as MAZE procedure all in 2006.  He also has an ascending aortic aneurysm.   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  I started him on dronedarone and coumadin with plan for cardioversion after 4 wks of therapeutic anticoagulation if he did not convert back to NSR on his own.  He went back into NSR spontaneously and later stopped coumadin. Atrial flutter recurred in 3/15 and again resolved spontaneously.  In 6/15, patient was admitted with transient dysarthria (resolved in about 10 minutes).  However, he was found to have evidence for CVA by MRI.  Therefore, he was started on coumadin. He was noted to be in atypical atrial flutter again.  Ranolazine was added to dronedarone.    In 8/15, [name] [name] was seen in consultation at the [facility] clinic.  He had atrial flutter ablation at [facility] in 11/15.    He was seen in followup at [facility] in 2/16.  Ranolazine was stopped.  Echo in 2/16 showed EF 55% with stable bioprosthetic mitral and aortic valves.  CTA chest showed stable 5 cm ascending aortic aneurysm.  He went back into atypical atrial flutter in 4/16.  Ranolazine was restarted and he was cardioverted back to NSR.    Cardiolite in 12/18 showed no ischemia. He wore a 30 day monitor in 12/18 with no arrhythmia.    He went into atrial flutter again during a respiratory illness in 2/19.  On 04/24/17, he was cardioverted back to NSR.  He thinks that he went into atrial flutter again later on that day. He was back in atrial flutter at followup  appointment. He was then admitted for Tikosyn loading in 4/19 and was cardioverted back to NSR after the load.   He had atrial fibrillation again in 6/19 with DCCV.   He was seen at the [facility] in 9/19.  Echo showed normal LV and RV systolic function, bioprosthetic MV with mean gradient up to 9 mmHg, normal bioprosthetic AoV.  CTA chest showed aortic root up to 5.3 cm.   He was back in atrial flutter in 12/19, had DCCV back to NSR.  Echo in 12/19 showed EF 60-65%, normal bioprosthetic aortic valve, bioprosthetic mitral valve with mean gradient 4 mmHg and no [name], severe LAE, aortic root 4.8 cm.   He went to the [facility] in 3/20.  Echo at that time showed EF 60-65% with stable bioprosthetic mitral and aortic valves (mean gradient 7 mmHg across MV and 9 mmHg across AoV).  CTA showed 5.0 cm aortic root and ascending aorta, stable.  He returned to the [facility] in 7/20.  He had atrial fibrillation/flutter ablation again and thinks he has been in NSR since that time.  CTA chest showed 5.1 cm ascending thoracic aorta. Seen at [facility] in 7/21.  Echo showed stable bioprosthetic mitral and aortic valves with normal EF.  CTA chest showed 5.0 cm ascending aortic aneurysm.   He returns for followup of diastolic CHF, atrial fibrillation, and bioprosthetic  AVR and MVR.  He is in NSR today, no palpitations.  He will get mildly short of breath after walking up 2 flights of stairs.  No dyspnea walking on flat ground.  He exercises regularly. No chest pain.  No lightheadedness.  He is working on losing weight.  BP is high in the office today but SBP has been in 120s when he checks at home.   ECG (12/12/11): Atypical atrial flutter versus atrial tachycardia with rate around 100 bpm, QTc normal.  ECG (12/29/11): NSR, 1st degree AV block PR 242 msec.    ECG (12/13): NSR, 1st degree AV block PR 244 msec    ECG (10/14): NSR, 1st degree AV block ECG (3/15): Atypical atrial flutter with  rate 87, inferior T wave inversions ECG (6/15): Atypical atrial flutter with rate 77 ECG (8/15): atypical atrial flutter rate 82 ECG (10/15): NSR, 1st degree AV block ECG (12/15): NSR, 1st degree AV block, QTc 410 msec ECG (5/16): NSR, 1st degree AV block, QTc 419 msec ECG (5/17): NSR, 1st degree AV block 270 msec, QTc 433 ECG (2/18): NSR, 1st degree AVB, nonspecific ST-T changes, QTc 435 msec ECG (11/18): NSR, 1st degree AVB, QTc 424 msec ECG (2/19, personally reviewed): atypical atrial flutter at 87, QTc 442 msec ECG (4/19, personally reviewed): NSR, 1st degree AVB, QTc 453 msec ECG (10/19, personally reviewed): NSR, 1st degree AVB, QTc 448 msec ECG (1/20, personally reviewed): atypical atrial flutter in 90s, QTc 472 msec.  ECG (8/20): NSR, 1st degree AVB, QTc 448 msec  ECG (1/21, personally reviewed): NSR, 1st degree AVB, QTc 434 msec ECG (5/21, personally reviewed): NSR, 1st degree AVB, QTc 447 ECG (1/22, personally reviewed): NSR, 1st degree AVB (306 msec), QTc 446 msec  Labs (3/14): LDL 64, TSH normal, K 3.9, creatinine 1.0     Labs (6/15): LDL 54, HDL 48, creatinine 0.95   Labs (9/15): LDL 54, HDL 25 Labs (10/15): K 4.5, creatinine 1.3, AST 45, ALT 64 Labs (4/16): K 4.3, creatinine 1.28, HCT 40.7 Labs (5/17): K 4.1, creatinine 1.16, LDL 66, HDL 47 Labs (2/18): LDL 42, HDL 43, K 4.4, creatinine 1.48 Labs (3/18): hgb 13.8 Labs (2/19): LDL 59 Labs (4/19): K 3.9, creatinine 0.97, Mg 2.0 Labs (6/19): K 4.4, creatinine 0.9 Labs (10/19): LDL 84 Labs (12/19): K 4.2, creatinine 1.13, hgb 13.9 Labs (3/20): K 4.1, creatinine 0.98 Labs (6/20): K 4.1, creatinine 0.91, LDL 103  Labs (9/20): K 4, creatinine 0.95 Labs (1/21): K 4, creatinine 0.94, LDL 80 Labs (5/21): K 4.3, creatinine 0.92                                                                                                                                 PMH: 1. Atrial fibrillation: s/p MAZE and LAA ligation in 2006.  No  documented atrial fibrillation since operation.  2. Atypical atrial flutter: 10/13, 3/15, and again in 6/15.  Recurrent 4/16 with DCCV.  -  Event monitor (12/18): no significant arrhythmia.  - Recurrence in 2/19: DCCV to NSR but quickly recurred.  - Admitted in 4/19 for Tikosyn loading and cardioverted to NSR.  - DCCV to NSR in 6/19.  - DCCV to NSR in 12/19. - 7/20 redo atrial flutter/fibrillation ablation.   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   - Echo 5/11 with EF 60-65%, normal bioprosthetic aortic valve, normal bioprosthetic mitral valve, moderate LAE.   - Echo 11/12 at [facility] looked ok per report.  - Echo (10/13) with EF 55-60%, mild LVH, normal bioprosthetic aortic and mitral valves, moderate LAE, PA systolic pressure 35 mmHg.   - Echo (6/15) with EF 60%, moderate LVH, normal-appearing bioprosthetic MV and AoV, PA systolic pressure 32 mmHg.   - Echo 9/15 [facility]) with EF 55%, mild LVH, PASP 52 mmHg, severe LAE, bioprosthetic mitral valve with no [name] and mean gradient 11 mmHg, moderate TR, bioprosthetic aortic valve with no AI and mean gradient 9 mmHg, dilated ascending aorta to 5 cm.   - Echo (2/16, [facility]) with EF 55%, stable bioprosthetic aortic and mitral valves (AoV mean gradient 7 mmHg, MV mean gradient 4 mmHg), normal RV size and systolic function.  - Echo (12/16, [facility]) with EF 58%, mild RV dilation with low normal systolic function, PA systolic pressure 34 mmHg, bioprosthetic MV and AoV functioning normally.  - Echo (5/18, [facility]) with EF 60-65%, RV normal size and systolic function, moderate LAE, bioprosthetic MV mean gradient 6 with no [name], bioprosthetic aortic valve with mean gradient 11 and no AI, PASP 41 mmHg.  - Echo (2/19): EF 60-65%, moderate LVH, bioprosthetic aortic and mitral valves functioning normally, PASP 23 mmHg, aortic root 4.5 cm.  - Echo (9/19):  EF 56%, normal RV size and systolic function, bioprosthetic MV with mean gradient 9 mmHg, trivial [name], bioprosthetic aortic valve with mean gradient 12 mmHg, PASP 51 mmHg.  - Echo (12/19): EF 60-65%, normal bioprosthetic aortic valve, bioprosthetic mitral valve with mean gradient 4 mmHg and no [name], severe LAE, aortic root 4.8 cm.  - Echo (3/20, [facility]): EF 60-65%, normal RV, severe LAE, 5.0 cm ascending aorta, bioprosthetic MV with mean gradient 7 mmHg, bioprosthetic aortic valve with mean gradient 9 mmHg.  - Echo (7/20, [facility]): EF 55-60%, moderate septal hypertrophy, normal RV size and systolic function, bioprosthetic MV with trace [name] and mean gradient 4, bioprosthetic aortic valve with mean gradient 14 mmHg, severe LAE.  - Echo (7/21, [facility]): EF 60%, normal RV, s/p bioprosthetic mitral valve with trace [name] and mean gradient 5, s/p bioprosthetic aortic valve with trace AI and mean gradient 15 mmHg, ascending aorta 4.9 cm.  4. Hyperlipidemia 5. L-spine surgery 6. LHC with no significant coronary disease in 2006 pre-op.  - Cardiolite (12/18): No ischemia.  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  CTA chest 9/15 with dilated sinuses of valsalva to 5.0 cm and ascending aorta 4.9 cm. CTA chest (2/16) with 5.0 cm ascending aorta.  CTA chest (12/16) with 4.9 cm aortic root at SOV, 4.8 cm ascending aorta.  - CT chest (5/18, [facility]): 5.0 cm aortic root, 5.0 cm ascending aorta.  - CTA chest (10/19, [facility]): 5.3 cm aortic root, 5.2 cm ascending aorta.  - CTA chest (3/20, [facility]): 5.0 cm aortic root and ascending aorta - CTA chest (  7/20, [facility]): 5.1 cm ascending aorta - CTA chest (7/21, [facility]): 5.1 cm aortic root, 5.0 cm ascending aorta.  9. Chronic diastolic CHF  Current Outpatient Medications  Medication Sig Dispense Refill  .  clobetasol cream (TEMOVATE) [id number] % Apply 1 application topically daily as needed (for skin irritation).     . Coenzyme Q10 (CO Q-10) 300 MG CAPS Take 300 mg by mouth daily.    . Cyanocobalamin (VITAMIN B-12) 5000 MCG SUBL Place 5,000 mcg under the tongue daily at 2 PM.     . docusate sodium (COLACE) 100 MG capsule Take 100 mg by mouth daily.    . finasteride (PROSCAR) 5 MG tablet Take 5 mg by mouth daily.    . Glucosamine-Chondroitin (COSAMIN DS PO) Take 1 tablet by mouth 2 (two) times daily.    [name] loratadine (CLARITIN) 10 MG tablet Take 10 mg by mouth daily as needed for allergies.     . Multiple Vitamins-Minerals (MULTIVITAL-M) TABS Take 1 tablet by mouth daily.     . Probiotic Product (PROBIOTIC FORMULA PO) Take 1 capsule by mouth 2 (two) times daily.     . tamsulosin (FLOMAX) 0.4 MG CAPS capsule Take 0.4 mg by mouth 2 (two) times daily.    [name] atorvastatin (LIPITOR) 80 MG tablet Take 1 tablet (80 mg total) by mouth every evening. 90 tablet 3  . dofetilide (TIKOSYN) 500 MCG capsule Take 1 capsule (500 mcg total) by mouth 2 (two) times daily. 180 capsule 3  . furosemide (LASIX) 20 MG tablet Take 1 tablet (20 mg total) by mouth daily. 90 tablet 3  . losartan (COZAAR) 25 MG tablet Take 1 tablet (25 mg total) by mouth daily. 90 tablet 3  . metoprolol succinate (TOPROL-XL) 50 MG 24 hr tablet Take 75 mg (1.5 tabs) by mouth twice daily. Take with or immediately following a meal. 270 tablet 3  . potassium chloride SA (KLOR-CON) 20 MEQ tablet Take 1 tablet (20 mEq total) by mouth daily. 90 tablet 3  . warfarin (COUMADIN) 5 MG tablet As directed 120 tablet 1   No current facility-administered medications for this encounter.    Allergies:   Naproxen   Social History:  The patient  reports that he has never smoked. He has never used smokeless tobacco. He reports current alcohol use of about 3.0 standard drinks of alcohol per week. He reports that he does not use drugs.   Family History:  The patient's  family history includes Cancer in his father; Diabetes in his brother and paternal aunt; Heart attack in his maternal grandfather; Macular degeneration in his mother.   ROS:  Please see the history of present illness.   All other systems are personally reviewed and negative.   Exam:   BP (!) 148/80   Pulse 63   Wt 89.4 kg (197 lb 3.2 oz)   SpO2 98%   BMI 29.98 kg/m  General: NAD Neck: No JVD, no thyromegaly or thyroid nodule.  Lungs: Clear to auscultation bilaterally with normal respiratory effort. CV: Nondisplaced PMI.  Heart regular S1/S2, no S3/S4, 2/6 early SEM RUSB.  No peripheral edema.  No carotid bruit.  Normal pedal pulses.  Abdomen: Soft, nontender, no hepatosplenomegaly, no distention.  Skin: Intact without lesions or rashes.  Neurologic: Alert and oriented x 3.  Psych: Normal affect. Extremities: No clubbing or cyanosis.  HEENT: Normal.   Recent Labs: 07/24/2019: Magnesium 2.0 03/26/2020: BUN 21; Creatinine, Ser 1.00; Hemoglobin 13.3; Platelets 266; Potassium 4.6; Sodium 139  Personally reviewed   Wt Readings from Last 3 Encounters:  03/26/20 89.4 kg (197 lb 3.2 oz)  07/24/19 85.5 kg (188 lb 9.6 oz)  07/11/19 88.9 kg (196 lb)     ASSESSMENT AND PLAN:  1. Atypical atrial flutter: Patient had a MAZE procedure with LAA ligation at the time of his valve replacement.  Since then, he has had occasional episodes of atypical atrial flutter. He had episodes in 10/13 and 3/15 that converted back to NSR on dronedarone and ranolazine. He was noted to be back in atrial flutter in 6/15 in the setting of a CVA.  He had ablation at [facility] in 11/15.  Atrial flutter recurred after ranolazine was stopped and he had DCCV again in 4/16.  Atrial flutter recurred in 2/19, DCCV back to NSR but atrial flutter quickly recurred. Tikosyn was then loaded and he was cardioverted back to NSR in 4/19.  He required DCCV again in 6/19 and in 12/19 (required 4 shocks).  He went back to atrial  flutter about 3 days after 12/19 DCCV.  He had redo flutter/fibrillation ablation at [facility] in 7/20 and has been in NSR since that time.  - Continue coumadin.  - Continue current Toprol XL. - Continue Tikosyn.  ECG showed stable QTc today.  Check BMET, Mg.   2. 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  Last echo at [facility] in 7/21 showed bioprosthetic aortic valve with stable mean gradient 15 mmHg and bioprosthetic mitral valve with stable mean gradient 5 mmHg.   - He will need repeat echo in 7/22, this will be done during his visit to the [facility].  3. HTN: BP needs good control given ascending aortic aneurysm. BP at goal when he checks at home though high in office today.  - Check BP daily at home and call in readings. - Continue losartan and Toprol XL.  4. Hyperlipidemia:  Continue atorvastatin, check lipids today.      5. CVA: Suspect this was related to atrial flutter (despite LA appendage ligation).  He is now on coumadin. No residual neurologic deficits. Continue long-term anticoagulation. - If has to stop coumadin in the future, should get Lovenox bridging.  - CBC today.  6. Thoracic aortic aneurysm: Dilated aortic root at sinuses of valsalva and ascending aorta to 5.0 cm, last CTA in 7/21 (stable).   - Close followup, may need aortic root/ascending aorta replacement if ascending aorta expands further but not yet.  Getting serial CTs. Next CTA chest will be due in 7/22, will have this done when he goes to the [facility] this summer.    - Continue Toprol XL given some evidence that beta blockers may attenuate thoracic aortic aneurysm growth.  7. Chronic diastolic CHF: Patient is not volume overloaded on exam.   - Continue Lasix 20 mg daily, will be getting BMET.  Followup in 6 months.    Signed, Loralie Champagne, MD  03/28/2020  [facility] [address] [facility] [place]  [place] [address] [phone] (office) [phone] (fax)

## 2020-04-07 ENCOUNTER — Other Ambulatory Visit (HOSPITAL_COMMUNITY): Payer: Self-pay | Admitting: Cardiology

## 2020-04-28 ENCOUNTER — Ambulatory Visit (INDEPENDENT_AMBULATORY_CARE_PROVIDER_SITE_OTHER): Payer: Medicare Other | Admitting: *Deleted

## 2020-04-28 DIAGNOSIS — I4892 Unspecified atrial flutter: Secondary | ICD-10-CM | POA: Diagnosis not present

## 2020-04-28 DIAGNOSIS — Z7901 Long term (current) use of anticoagulants: Secondary | ICD-10-CM

## 2020-04-28 LAB — POCT INR: INR: 3.4 — AB (ref 2.0–3.0)

## 2020-04-28 NOTE — Patient Instructions (Signed)
Description   Do not take any Warfarin today then continue taking Warfarin 1.5 tablets daily except for 1 tablet on Sundays, Tuesdays and Thursdays. Recheck INR in 4 weeks. Call and let us know if scheduled for any procedures [phone] or medication changes.

## 2020-05-06 DIAGNOSIS — D485 Neoplasm of uncertain behavior of skin: Secondary | ICD-10-CM | POA: Diagnosis not present

## 2020-05-06 DIAGNOSIS — L812 Freckles: Secondary | ICD-10-CM | POA: Diagnosis not present

## 2020-05-06 DIAGNOSIS — L218 Other seborrheic dermatitis: Secondary | ICD-10-CM | POA: Diagnosis not present

## 2020-05-06 DIAGNOSIS — L821 Other seborrheic keratosis: Secondary | ICD-10-CM | POA: Diagnosis not present

## 2020-05-06 DIAGNOSIS — D0461 Carcinoma in situ of skin of right upper limb, including shoulder: Secondary | ICD-10-CM | POA: Diagnosis not present

## 2020-05-06 DIAGNOSIS — D1801 Hemangioma of skin and subcutaneous tissue: Secondary | ICD-10-CM | POA: Diagnosis not present

## 2020-05-11 ENCOUNTER — Telehealth (HOSPITAL_COMMUNITY): Payer: Self-pay | Admitting: Pharmacy Technician

## 2020-05-11 NOTE — Telephone Encounter (Signed)
Patient Advocate Encounter   Received notification from [facility] that prior authorization for Metoprolol ER 50mg  is required.   PA submitted on CoverMyMeds Key [id number] Status is pending   Will continue to follow.

## 2020-05-18 NOTE — Telephone Encounter (Signed)
Advanced Heart Failure Patient Advocate Encounter  Prior Authorization for Metoprolol ER has been approved.    PA# [id number] Effective dates: 05/11/20 through 03/06/21  Charlann Boxer, CPhT

## 2020-05-22 ENCOUNTER — Other Ambulatory Visit (HOSPITAL_COMMUNITY): Payer: Self-pay

## 2020-05-22 MED ORDER — METOPROLOL SUCCINATE ER 50 MG PO TB24: ORAL_TABLET | ORAL | 3 refills | Status: DC

## 2020-05-28 ENCOUNTER — Other Ambulatory Visit: Payer: Self-pay

## 2020-05-28 ENCOUNTER — Ambulatory Visit (INDEPENDENT_AMBULATORY_CARE_PROVIDER_SITE_OTHER): Payer: Medicare Other | Admitting: *Deleted

## 2020-05-28 DIAGNOSIS — Z5181 Encounter for therapeutic drug level monitoring: Secondary | ICD-10-CM

## 2020-05-28 DIAGNOSIS — I482 Chronic atrial fibrillation, unspecified: Secondary | ICD-10-CM

## 2020-05-28 DIAGNOSIS — I4892 Unspecified atrial flutter: Secondary | ICD-10-CM | POA: Diagnosis not present

## 2020-05-28 LAB — POCT INR: INR: 2.8 (ref 2.0–3.0)

## 2020-05-28 NOTE — Patient Instructions (Signed)
Description   Continue taking Warfarin 1.5 tablets daily except for 1 tablet on Sundays, Tuesdays and Thursdays. Recheck INR in 5 weeks. Call and let us know if scheduled for any procedures [phone] or medication changes.

## 2020-06-10 DIAGNOSIS — N401 Enlarged prostate with lower urinary tract symptoms: Secondary | ICD-10-CM | POA: Diagnosis not present

## 2020-06-26 ENCOUNTER — Other Ambulatory Visit (HOSPITAL_BASED_OUTPATIENT_CLINIC_OR_DEPARTMENT_OTHER): Payer: Self-pay

## 2020-06-26 ENCOUNTER — Other Ambulatory Visit: Payer: Self-pay

## 2020-06-26 ENCOUNTER — Ambulatory Visit: Payer: Self-pay | Attending: Internal Medicine

## 2020-06-26 DIAGNOSIS — Z23 Encounter for immunization: Secondary | ICD-10-CM

## 2020-06-26 MED ORDER — COVID-19 MRNA VACC (MODERNA) 100 MCG/0.5ML IM SUSP
INTRAMUSCULAR | 0 refills | Status: DC
  Filled 2020-06-26: qty 0.25, 1d supply, fill #0

## 2020-06-26 NOTE — Progress Notes (Signed)
   Covid-19 Vaccination Clinic  Name:  [name] [name]    MRN: [id number] DOB: 05-10-1940  06/26/2020  [name] was observed post Covid-19 immunization for 15 minutes without incident. He was provided with Vaccine Information Sheet and instruction to access the V-Safe system.   [name] was instructed to call [phone] with any severe reactions post vaccine: [name] Difficulty breathing  . Swelling of face and throat  . A fast heartbeat  . A bad rash all over body  . Dizziness and weakness   Immunizations Administered    Name Date Dose VIS Date Route   PFIZER Comrnaty(Gray TOP) Covid-19 Vaccine 06/26/2020  2:41 PM 0.3 mL 02/13/2020 Intramuscular   Manufacturer: [place]   Lot: [id number]   [place]: [phone]

## 2020-06-29 ENCOUNTER — Ambulatory Visit (INDEPENDENT_AMBULATORY_CARE_PROVIDER_SITE_OTHER): Payer: Medicare Other | Admitting: *Deleted

## 2020-06-29 ENCOUNTER — Other Ambulatory Visit: Payer: Self-pay

## 2020-06-29 DIAGNOSIS — I4892 Unspecified atrial flutter: Secondary | ICD-10-CM | POA: Diagnosis not present

## 2020-06-29 DIAGNOSIS — I482 Chronic atrial fibrillation, unspecified: Secondary | ICD-10-CM | POA: Diagnosis not present

## 2020-06-29 DIAGNOSIS — Z5181 Encounter for therapeutic drug level monitoring: Secondary | ICD-10-CM

## 2020-06-29 LAB — POCT INR: INR: 3.3 — AB (ref 2.0–3.0)

## 2020-06-29 NOTE — Patient Instructions (Signed)
Description   Hold today, then continue taking Warfarin 1.5 tablets daily except for 1 tablet on Sundays, Tuesdays and Thursdays. Recheck INR in 3 weeks. Call and let us know if scheduled for any procedures [phone] or medication changes.

## 2020-07-08 DIAGNOSIS — R7303 Prediabetes: Secondary | ICD-10-CM | POA: Diagnosis not present

## 2020-07-08 DIAGNOSIS — Z9889 Other specified postprocedural states: Secondary | ICD-10-CM | POA: Diagnosis not present

## 2020-07-08 DIAGNOSIS — I480 Paroxysmal atrial fibrillation: Secondary | ICD-10-CM | POA: Diagnosis not present

## 2020-07-08 DIAGNOSIS — I484 Atypical atrial flutter: Secondary | ICD-10-CM | POA: Diagnosis not present

## 2020-07-08 DIAGNOSIS — I10 Essential (primary) hypertension: Secondary | ICD-10-CM | POA: Diagnosis not present

## 2020-07-08 DIAGNOSIS — I440 Atrioventricular block, first degree: Secondary | ICD-10-CM | POA: Diagnosis not present

## 2020-07-08 DIAGNOSIS — I350 Nonrheumatic aortic (valve) stenosis: Secondary | ICD-10-CM | POA: Diagnosis not present

## 2020-07-08 DIAGNOSIS — I359 Nonrheumatic aortic valve disorder, unspecified: Secondary | ICD-10-CM | POA: Diagnosis not present

## 2020-07-08 DIAGNOSIS — I712 Thoracic aortic aneurysm, without rupture: Secondary | ICD-10-CM | POA: Diagnosis not present

## 2020-07-08 DIAGNOSIS — I059 Rheumatic mitral valve disease, unspecified: Secondary | ICD-10-CM | POA: Diagnosis not present

## 2020-07-08 DIAGNOSIS — Z952 Presence of prosthetic heart valve: Secondary | ICD-10-CM | POA: Diagnosis not present

## 2020-07-08 DIAGNOSIS — E785 Hyperlipidemia, unspecified: Secondary | ICD-10-CM | POA: Diagnosis not present

## 2020-07-08 DIAGNOSIS — I4891 Unspecified atrial fibrillation: Secondary | ICD-10-CM | POA: Diagnosis not present

## 2020-07-17 ENCOUNTER — Other Ambulatory Visit: Payer: Self-pay

## 2020-07-17 ENCOUNTER — Ambulatory Visit (INDEPENDENT_AMBULATORY_CARE_PROVIDER_SITE_OTHER): Payer: Medicare Other | Admitting: *Deleted

## 2020-07-17 DIAGNOSIS — Z5181 Encounter for therapeutic drug level monitoring: Secondary | ICD-10-CM

## 2020-07-17 DIAGNOSIS — I4892 Unspecified atrial flutter: Secondary | ICD-10-CM | POA: Diagnosis not present

## 2020-07-17 DIAGNOSIS — Z7901 Long term (current) use of anticoagulants: Secondary | ICD-10-CM

## 2020-07-17 DIAGNOSIS — I482 Chronic atrial fibrillation, unspecified: Secondary | ICD-10-CM

## 2020-07-17 LAB — POCT INR: INR: 2.6 (ref 2.0–3.0)

## 2020-07-17 NOTE — Patient Instructions (Signed)
Description   Continue taking Warfarin 1.5 tablets daily except for 1 tablet on Sundays, Tuesdays and Thursdays. Recheck INR in 4 weeks. Call and let us know if scheduled for any procedures [phone] or medication changes.

## 2020-07-26 ENCOUNTER — Encounter (HOSPITAL_COMMUNITY): Payer: Self-pay

## 2020-07-26 ENCOUNTER — Emergency Department (HOSPITAL_COMMUNITY): Payer: Medicare Other

## 2020-07-26 ENCOUNTER — Emergency Department (HOSPITAL_COMMUNITY)
Admission: EM | Admit: 2020-07-26 | Discharge: 2020-07-26 | Disposition: A | Discharge status: Home / Self Care | Payer: Medicare Other | Source: Home / Self Care | Attending: Emergency Medicine | Admitting: Emergency Medicine

## 2020-07-26 DIAGNOSIS — Z7901 Long term (current) use of anticoagulants: Secondary | ICD-10-CM | POA: Insufficient documentation

## 2020-07-26 DIAGNOSIS — S065X9A Traumatic subdural hemorrhage with loss of consciousness of unspecified duration, initial encounter: Secondary | ICD-10-CM | POA: Diagnosis not present

## 2020-07-26 DIAGNOSIS — I639 Cerebral infarction, unspecified: Secondary | ICD-10-CM | POA: Diagnosis not present

## 2020-07-26 DIAGNOSIS — J9601 Acute respiratory failure with hypoxia: Secondary | ICD-10-CM | POA: Diagnosis not present

## 2020-07-26 DIAGNOSIS — Z66 Do not resuscitate: Secondary | ICD-10-CM | POA: Diagnosis not present

## 2020-07-26 DIAGNOSIS — G9349 Other encephalopathy: Secondary | ICD-10-CM | POA: Diagnosis not present

## 2020-07-26 DIAGNOSIS — R4020 Unspecified coma: Secondary | ICD-10-CM | POA: Diagnosis not present

## 2020-07-26 DIAGNOSIS — R55 Syncope and collapse: Secondary | ICD-10-CM

## 2020-07-26 DIAGNOSIS — Y9222 Religious institution as the place of occurrence of the external cause: Secondary | ICD-10-CM | POA: Insufficient documentation

## 2020-07-26 DIAGNOSIS — R21 Rash and other nonspecific skin eruption: Secondary | ICD-10-CM | POA: Diagnosis not present

## 2020-07-26 DIAGNOSIS — R011 Cardiac murmur, unspecified: Secondary | ICD-10-CM | POA: Insufficient documentation

## 2020-07-26 DIAGNOSIS — J3489 Other specified disorders of nose and nasal sinuses: Secondary | ICD-10-CM | POA: Diagnosis not present

## 2020-07-26 DIAGNOSIS — R4182 Altered mental status, unspecified: Secondary | ICD-10-CM | POA: Diagnosis not present

## 2020-07-26 DIAGNOSIS — S0003XA Contusion of scalp, initial encounter: Secondary | ICD-10-CM | POA: Diagnosis not present

## 2020-07-26 DIAGNOSIS — R410 Disorientation, unspecified: Secondary | ICD-10-CM | POA: Diagnosis not present

## 2020-07-26 DIAGNOSIS — S065X0A Traumatic subdural hemorrhage without loss of consciousness, initial encounter: Secondary | ICD-10-CM | POA: Diagnosis not present

## 2020-07-26 DIAGNOSIS — I4819 Other persistent atrial fibrillation: Secondary | ICD-10-CM | POA: Diagnosis not present

## 2020-07-26 DIAGNOSIS — M542 Cervicalgia: Secondary | ICD-10-CM | POA: Insufficient documentation

## 2020-07-26 DIAGNOSIS — Z20822 Contact with and (suspected) exposure to covid-19: Secondary | ICD-10-CM | POA: Insufficient documentation

## 2020-07-26 DIAGNOSIS — I517 Cardiomegaly: Secondary | ICD-10-CM | POA: Diagnosis not present

## 2020-07-26 DIAGNOSIS — M5030 Other cervical disc degeneration, unspecified cervical region: Secondary | ICD-10-CM | POA: Diagnosis not present

## 2020-07-26 DIAGNOSIS — W01198A Fall on same level from slipping, tripping and stumbling with subsequent striking against other object, initial encounter: Secondary | ICD-10-CM | POA: Insufficient documentation

## 2020-07-26 DIAGNOSIS — S0101XA Laceration without foreign body of scalp, initial encounter: Secondary | ICD-10-CM | POA: Insufficient documentation

## 2020-07-26 DIAGNOSIS — R404 Transient alteration of awareness: Secondary | ICD-10-CM | POA: Diagnosis not present

## 2020-07-26 DIAGNOSIS — S0990XA Unspecified injury of head, initial encounter: Secondary | ICD-10-CM | POA: Diagnosis not present

## 2020-07-26 DIAGNOSIS — G935 Compression of brain: Secondary | ICD-10-CM | POA: Diagnosis not present

## 2020-07-26 DIAGNOSIS — J811 Chronic pulmonary edema: Secondary | ICD-10-CM | POA: Diagnosis not present

## 2020-07-26 DIAGNOSIS — Z043 Encounter for examination and observation following other accident: Secondary | ICD-10-CM | POA: Diagnosis not present

## 2020-07-26 HISTORY — DX: Presence of prosthetic heart valve: Z95.2

## 2020-07-26 LAB — TROPONIN I (HIGH SENSITIVITY)
Troponin I (High Sensitivity): 7 ng/L (ref <18)
Troponin I (High Sensitivity): 8 ng/L (ref <18)

## 2020-07-26 LAB — BASIC METABOLIC PANEL
Anion gap: 9 (ref 5–15)
BUN: 22 mg/dL (ref 8–23)
CO2: 22 mmol/L (ref 22–32)
Calcium: 9.1 mg/dL (ref 8.9–10.3)
Chloride: 107 mmol/L (ref 98–111)
Creatinine, Ser: 0.98 mg/dL (ref 0.61–1.24)
GFR, Estimated: >60 mL/min (ref >60)
Glucose, Bld: 152 mg/dL — ABNORMAL HIGH (ref 70–99)
Potassium: 4.3 mmol/L (ref 3.5–5.1)
Sodium: 138 mmol/L (ref 135–145)

## 2020-07-26 LAB — CBC WITH DIFFERENTIAL/PLATELET
Abs Immature Granulocytes: 0.04 10*3/uL (ref 0.00–0.07)
Basophils Absolute: 0.1 10*3/uL (ref 0.0–0.1)
Basophils Relative: 1 %
Eosinophils Absolute: 0.2 10*3/uL (ref 0.0–0.5)
Eosinophils Relative: 3 %
HCT: 42.1 % (ref 39.0–52.0)
Hemoglobin: 13.7 g/dL (ref 13.0–17.0)
Immature Granulocytes: 1 %
Lymphocytes Relative: 30 %
Lymphs Abs: 2.6 10*3/uL (ref 0.7–4.0)
MCH: 31.1 pg (ref 26.0–34.0)
MCHC: 32.5 g/dL (ref 30.0–36.0)
MCV: 95.5 fL (ref 80.0–100.0)
Monocytes Absolute: 0.8 10*3/uL (ref 0.1–1.0)
Monocytes Relative: 9 %
Neutro Abs: 4.8 10*3/uL (ref 1.7–7.7)
Neutrophils Relative %: 56 %
Platelets: 203 10*3/uL (ref 150–400)
RBC: 4.41 MIL/uL (ref 4.22–5.81)
RDW: 14.3 % (ref 11.5–15.5)
WBC: 8.5 10*3/uL (ref 4.0–10.5)
nRBC: 0 % (ref 0.0–0.2)

## 2020-07-26 LAB — RESP PANEL BY RT-PCR (FLU A&B, COVID) ARPGX2
Influenza A by PCR: NEGATIVE
Influenza B by PCR: NEGATIVE
SARS Coronavirus 2 by RT PCR: NEGATIVE

## 2020-07-26 LAB — PROTIME-INR
INR: 3.4 — ABNORMAL HIGH (ref 0.8–1.2)
Prothrombin Time: 34.2 seconds — ABNORMAL HIGH (ref 11.4–15.2)

## 2020-07-26 LAB — CBG MONITORING, ED: Glucose-Capillary: 160 mg/dL — ABNORMAL HIGH (ref 70–99)

## 2020-07-26 MED ORDER — DOFETILIDE 500 MCG PO CAPS
500.0000 ug | ORAL_CAPSULE | Freq: Two times a day (BID) | ORAL | Status: DC
  Filled 2020-07-26 (×2): qty 1

## 2020-07-26 MED ORDER — ACETAMINOPHEN 500 MG PO TABS
1000.0000 mg | ORAL_TABLET | Freq: Once | ORAL | Status: AC
  Administered 2020-07-26: 1000 mg via ORAL
  Filled 2020-07-26: qty 2

## 2020-07-26 MED ORDER — SODIUM CHLORIDE 0.9 % IV BOLUS
1000.0000 mL | Freq: Once | INTRAVENOUS | Status: AC
  Administered 2020-07-26: 1000 mL via INTRAVENOUS

## 2020-07-26 NOTE — ED Provider Notes (Signed)
[facility] EMERGENCY DEPARTMENT Provider Note   CSN: [id number] Arrival date & time: 07/26/20  1210   Note that patient's chart today requires merger with his other medical record - MRN: [id number]    History Chief Complaint  Patient presents with  . level 2 fall    [name] [name] is a 80 y.o. 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  History is supplemented by the patient's wife at bedside.  The patient reports he was in his usual state health today and was at church today.   While sitting in the pew he momentarily felt dizzy and lightheaded, then recovered.  He went to stand near the back with his wife, and she reports he suddenly seemed pale and lost consciousness.  He fell backwards striking his head.  She reports there was no generalized shaking or eye rolling, and he was not responding to voice for about 1-2 minutes.  Some other churchgoers including physicians came to assess him.  He recovered afterwards but was groggy.  EMS reports that the patient was awake on their arrival.  He did have a small amount of bleeding from the back of the head.  He was placed in a C-spine collar, and brought into the ER.   Patient currently reports some mild headache pain and pain in the back of his head where he struck it.  He reports some neck pain bilaterally in the back that he feels is related to his C-spine collar.  He denies mid or lower back pain, chest pain, abdominal pain.  Per patient and family report, he does not have prior history of syndrome.  The patient says he was feeling well all week.    He follows with Dr Loralie Champagne from cardiology.  He was seen by Dr Laurann Montana from cardiovascular imaging this month on 5/4 - per care everywhere records (note that patient's chart today requires merger with his other MRN: [id number]), he  has had a stable thoracic aneurysm for 5 years.  He had recent echocardiogram this month at [facility] clinic. Imaging reports as noted below from Dr Delene Ruffini office evaluation:  "Diagnosis: SINUS RHYTHM WITH 1ST DEGREE AV BLOCK  OTHERWISE NORMAL ECG  Echocardiogram: - Exam indication: AVR/MVR  - The left ventricle is normal in size. There is mild septal left ventricular  hypertrophy. Left ventricular systolic function is normal. EF = 54  5% (2D  biplane) Left ventricular diastolic function was not evaluated due to mitral valve surgery.  - The right ventricle is normal in size. Right ventricular systolic function is normal.  - The left atrial cavity is mildly dilated.  - The visualized aorta is dilated with a maximal dimension of 5.0 cm.  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 There is trace mitral  valve regurgitation. The peak gradient is 23 mmHg and the mean gradient is 6 mmHg.  Prior peak/mean gradients 19/5 mmHg.  - Carpentier-Edwards prosthetic aortic valve (size #27). There is trace aortic  valve regurgitation. The peak gradient is 19 mmHg, the mean gradient is 11 mmHg  and the dimensionless valve index is 0.31. Prior peak/mean gradients 26/15 mmHg.   - Exam was compared with the prior [facility] echocardiographic exam performed on  09/30/2019. Similar findings noted   CT CHEST 1. Stable aneurysmal aortic root (5.1 cm, 17.3 cm2, 10.0 cm2/m) and mid ascending aorta (5.1  cm, 18.8 cm2, 10.9 cm2/m), with subsequent tapering.  The sinotubular junction is partially effaced. The arch and descending  thoracic aorta are normal in course, caliber with mild atherosclerotic  changes. No acute aortic pathology identified.   2. Moderate left and mild right atrial dilation. Left atrial appendage  again demonstrates hypoattenuated filling defect of the distal portion,  which may represent slow flow or thrombus (delayed phase imaging not  performed to characterize this). Correlate  clinically.   3. 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 Moderate mitral annular calcifications  predominantly posteriorly. S/P AVR (CE#27) in stable position, with mild leaflet calcification.     HPI     Past Medical History:  Diagnosis Date  . H/O aortic valve replacement   . H/O mitral valve replacement     There are no problems to display for this patient.  No family history on file.  Social History   Tobacco Use  . Smoking status: Never Smoker  . Smokeless tobacco: Never Used  Vaping Use  . Vaping Use: Never used  Substance Use Topics  . Alcohol use: Never  . Drug use: Never    Home Medications Prior to Admission medications   Medication Sig Start Date End Date Taking? Authorizing Provider  atorvastatin (LIPITOR) 80 MG tablet Take 1 tablet by mouth daily. 06/29/20  Yes [provider]  dofetilide (TIKOSYN) 500 MCG capsule Take 500 mcg by mouth every 12 (twelve) hours. 05/20/20  Yes [provider]  losartan (COZAAR) 25 MG tablet Take 25 mg by mouth daily. 04/06/20  Yes [provider]  metoprolol succinate (TOPROL-XL) 50 MG 24 hr tablet Take 50 mg by mouth every 12 (twelve) hours. 05/31/20  Yes [provider]  potassium chloride SA (KLOR-CON) 20 MEQ tablet Take 20 mEq by mouth daily. 04/22/20  Yes [provider]  tamsulosin (FLOMAX) 0.4 MG CAPS capsule Take 0.8 mg by mouth every 12 (twelve) hours. 07/07/20  Yes [provider]  warfarin (COUMADIN) 5 MG tablet Take 5 mg by mouth daily. 07/22/20  Yes [provider]    Allergies    Naproxen and No healthtouch food allergies  Review of Systems   Review of Systems  Constitutional: Negative for chills and fever.  Eyes: Negative for pain and visual disturbance.  Respiratory: Negative for cough and shortness of breath.   Cardiovascular: Negative for chest pain and palpitations.  Gastrointestinal:  Negative for abdominal pain, nausea and vomiting.  Musculoskeletal: Positive for neck pain. Negative for arthralgias and back pain.  Skin: Positive for wound. Negative for rash.  Neurological: Positive for syncope and headaches.  Psychiatric/Behavioral: Negative for agitation and confusion.  All other systems reviewed and are negative.   Physical Exam Updated Vital Signs BP (!) 191/91 (BP Location: Right Arm)   Pulse 72   Temp 98.5 F (36.9 C) (Oral)   Resp (!) 21   Ht 5\' 7"  (1.702 m)   Wt 86.2 kg   SpO2 97%   BMI 29.76 kg/m   Physical Exam Constitutional:      General: He is not in acute distress. HENT:     Head: Normocephalic.      Comments: Small 0.75 cm to occiput, no significant underlying hematoma, no active bleed Eyes:     Conjunctiva/sclera: Conjunctivae normal.     Pupils: Pupils are equal, round, and reactive to light.  Neck:     Comments: C spine collar in place No spinal midline  tenderness Bilateral posterior neck tenderness to touch Cardiovascular:     Rate and Rhythm: Normal rate and regular rhythm.     Pulses: Normal pulses.     Heart sounds: Murmur heard.    Pulmonary:     Effort: Pulmonary effort is normal. No respiratory distress.     Breath sounds: Normal breath sounds.  Abdominal:     General: There is no distension.     Tenderness: There is no abdominal tenderness.  Skin:    General: Skin is warm and dry.  Neurological:     General: No focal deficit present.     Mental Status: He is alert and oriented to person, place, and time. Mental status is at baseline.     Cranial Nerves: No cranial nerve deficit.     Sensory: No sensory deficit.     Motor: No weakness.  Psychiatric:        Mood and Affect: Mood normal.        Behavior: Behavior normal.     ED Results / Procedures / Treatments   Labs (all labs ordered are listed, but only abnormal results are displayed) Labs Reviewed  BASIC METABOLIC PANEL - Abnormal; Notable for the  following components:      Result Value   Glucose, Bld 152 (*)    All other components within normal limits  PROTIME-INR - Abnormal; Notable for the following components:   Prothrombin Time 34.2 (*)    INR 3.4 (*)    All other components within normal limits  CBG MONITORING, ED - Abnormal; Notable for the following components:   Glucose-Capillary 160 (*)    All other components within normal limits  RESP PANEL BY RT-PCR (FLU A&B, COVID) ARPGX2  CBC WITH DIFFERENTIAL/PLATELET  TROPONIN I (HIGH SENSITIVITY)  TROPONIN I (HIGH SENSITIVITY)    EKG EKG Interpretation  Date/Time:  Sunday Jul 26 2020 12:18:11 EDT Ventricular Rate:  67 PR Interval:  287 QRS Duration: 112 QT Interval:  422 QTC Calculation: 446 R Axis:   71 Text Interpretation: Sinus rhythm Prolonged PR interval Borderline intraventricular conduction delay Minimal ST depression, inferior leads Confirmed by Octaviano Glow [phone]) on 07/26/2020 12:21:17 PM   Radiology CT Head Wo Contrast  Result Date: 07/26/2020 CLINICAL DATA:  Fall, on blood thinners.  Posterior hematoma EXAM: CT HEAD WITHOUT CONTRAST TECHNIQUE: Contiguous axial images were obtained from the base of the skull through the vertex without intravenous contrast. COMPARISON:  08/05/2013 FINDINGS: Brain: Old infarct in the posterior left parietal region, stable. No acute intracranial abnormality. Specifically, no hemorrhage, hydrocephalus, mass lesion, acute infarction, or significant intracranial injury. Vascular: No hyperdense vessel or unexpected calcification. Skull: No acute calvarial abnormality. Sinuses/Orbits: Mucosal thickening in the right maxillary sinus. No air-fluid levels. Other: Posterior scalp hematoma in the left occipital region. 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 No acute intracranial abnormality. Electronically Signed   By: Rolm Baptise M.D.   On: 07/26/2020 13:14   CT Cervical Spine Wo Contrast  Result Date: 07/26/2020 CLINICAL  DATA:  Fall, on blood thinners. EXAM: CT CERVICAL SPINE WITHOUT CONTRAST TECHNIQUE: Multidetector CT imaging of the cervical spine was performed without intravenous contrast. Multiplanar CT image reconstructions were also generated. COMPARISON:  None FINDINGS: Alignment: No subluxation. Skull base and vertebrae: No acute fracture. No primary bone lesion or focal pathologic process. Soft tissues and spinal canal: No prevertebral fluid or swelling. No visible canal hematoma. Disc levels: Diffuse degenerative disc disease with disc space narrowing and spurring. Diffuse degenerative facet  disease bilaterally. Upper chest: No acute findings. Other: None IMPRESSION: Diffuse degenerative disc and facet disease. No acute bony abnormality. Electronically Signed   By: Rolm Baptise M.D.   On: 07/26/2020 13:15   DG Chest Portable 1 View  Result Date: 07/26/2020 CLINICAL DATA:  Syncope EXAM: PORTABLE CHEST 1 VIEW COMPARISON:  08/06/2013 FINDINGS: Prior sternotomy and cardiac valve replacement. Cardiomegaly. Atherosclerotic calcification of the aortic knob. Mild pulmonary vascular congestion. No focal airspace consolidation, pleural effusion, or pneumothorax. IMPRESSION: Cardiomegaly and mild pulmonary vascular congestion. Electronically Signed   By: Davina Poke D.O.   On: 07/26/2020 13:08    Procedures Procedures   Medications Ordered in ED Medications  dofetilide (TIKOSYN) capsule 500 mcg (500 mcg Oral Not Given 07/26/20 1458)  sodium chloride 0.9 % bolus 1,000 mL (0 mLs Intravenous Stopped 07/26/20 1429)  acetaminophen (TYLENOL) tablet 1,000 mg (1,000 mg Oral Given 07/26/20 1645)    ED Course  I have reviewed the triage vital signs and the nursing notes.  Pertinent labs & imaging results that were available during my care of the patient were reviewed by me and considered in my medical decision making (see chart for details).  This patient presents with syncope vs near syncope.  This involves an  extensive number of treatment options, and is a complaint that carries with it a high risk of complications and morbidity.  The differential diagnosis includes, but is not limited to, arrhythmia vs anemia vs ACS vs dehydration vs orthostasis vs other  The wound to back of scalp is quite superficial - too small for staples.  Bleeding is controlled.  We will wrap it here.  He reports some mild lightheadedness and headache which may be a mild concussion. I doubt a stroke clinically with this presentation.  CTH and C-spine ordered and reviewed  - no acute brain bleed, no acute cervical fracture. C-spine collar subsequently removed with some relief of his neck pain.  I ordered, reviewed, and interpreted labs.  No evidence of acute anemia, electrolyte derangement, hypoglycemia.  Covid/flu is negative. Troponins are low and flat on repeat.  DG chest reviewed with some mild cardiomegaly.  No evident PNA.  ECG on arrival reviewed showing 1st degree AV block, PR interval prolonged, but no evidence of acute ischemia or heart block.  Telemetry reviewed with regular HR.  Based on this clinical exam, I have a lower suspicion for acute PE, stroke, vascular dissection, ruptured aneurysm, sepsis, anemia, or other life-threatening surgical emergencies.  His INR is therapeutic, which lowers the risk of clotting.  He has no hypoxia or respiratory complaints to suggest PE, and flat/low troponins do not suggest a large PE capable of causing syndrome.  His orthostatic vital signs are unremarkable.  I reviewed his prior medical workup including recent cardiac evaluation.  His EF was normal and his valves appeared to function appropriately on these images.  I doubt this is new onset CHF or valve rupture.  He does not show signs of shock or HD instability.  I cannot exclude an arrhythmia.  However he is on two arrhythmic medications and has a stable rhythm here.  We engaged in shared medical decision making with the  patient and his son & wife regarding observation in the hospital vs close outpatient follow up, and he would prefer to go home.  I do believe he is clinically stable to do so at this time.  Clinical Course as of 07/26/20 1743  Sun Jul 26, 2020  1346 C-spine collar removed.  Patient remains asymptomatic. [MT]  [id number] Patient's orthostatics are negative.  His blood pressure remains slightly high but stable.  He denies lightheadedness.  He has neck soreness from where he was wearing a C-spine collar, which reproducible with palpation of the posterior neck.  He was given Tylenol for this.  His son and wife are now at the bedside.  We are able to ambulate the patient.  We discussed observation in the hospital overnight versus close monitoring at home with cardiology follow-up, the patient would prefer to go home.  At this time I do not see any emergent indication for admission, and have a low suspicion for aortic dissection, pulmonary embolism, acute coronary syndrome, anemia, stroke, or other emergent medical condition.  However return precautions were discussed [MT]    Clinical Course User Index [MT] Kaylei Frink, Carola Rhine, MD    Final Clinical Impression(s) / ED Diagnoses Final diagnoses:  Syncope, unspecified syncope type    Rx / DC Orders ED Discharge Orders    None       Langston Masker Carola Rhine, MD 07/26/20 1745

## 2020-07-26 NOTE — ED Triage Notes (Signed)
Was at church walking in hallway, passed out fell backwards and hit back of head with hematoma left occipital  noted. Conscious/alert when medic arrived. On coumadin. c-collar in place. BP 180/85, HR 60's, 98% RA, CBG 142, 12 lead unremarkable. C/o neck pain, pupils equal/reactive,

## 2020-07-26 NOTE — Discharge Instructions (Addendum)
You may have a mild concussion from where you hit your head.  The symptoms of being dazed or lightheaded can last a few days.  You have a very small cut on the back of your head which should close on its own.  Leave the bandages in place for the next 24 hours.  After that you can shower normally.  Drink plenty of water at home.  Try to rest, take your time when standing up (particularly after using the toilet), and avoid exertion for the next 7 days. Stay out of the heat.  Please reach out to Dr. Claris Gladden office tomorrow morning to discuss this episode and arrange for a follow-up visit.  If you begin to feel very lightheaded again at home, or have another episode where you lose consciousness, please call 911 and return back to the emergency department.

## 2020-07-26 NOTE — ED Notes (Signed)
Patient transported to CT 

## 2020-07-26 NOTE — ED Notes (Signed)
Soft gauze dressing to posterior head, hematoma site with soft kerlex gauze to cover, no active bleeding noted. Patient tolerated procedure well.

## 2020-07-26 NOTE — ED Notes (Signed)
c collar in place

## 2020-07-26 NOTE — ED Notes (Signed)
Dr Lorenza Chick to bedside

## 2020-07-26 NOTE — ED Notes (Signed)
TRN note:  TRN at bedside on arrival  Reason for activation: Fall/syncopal episode while at church. Pt is on Coumadin. C-Collar intact, small abrasion oozing on back of head. Pt is alert/oriented x 4. Transported to CT with this TRN CONSULTS- none as of yet.   Rolene Arbour, RN Trauma Response Nurse

## 2020-07-26 NOTE — ED Notes (Signed)
Wife -- at bedside, pt is due for Ticosin and Toprol now, needs to take with food. Explained NPO to pt and family.

## 2020-07-27 ENCOUNTER — Encounter (HOSPITAL_COMMUNITY): Payer: Self-pay | Admitting: Cardiology

## 2020-07-27 ENCOUNTER — Emergency Department (HOSPITAL_COMMUNITY): Payer: Medicare Other

## 2020-07-27 ENCOUNTER — Encounter (HOSPITAL_COMMUNITY): Payer: Self-pay | Admitting: Certified Registered Nurse Anesthetist

## 2020-07-27 ENCOUNTER — Encounter (HOSPITAL_COMMUNITY)
Admission: EM | Disposition: A | Discharge status: Rehabilitation Facility | Payer: Self-pay | Source: Home / Self Care | Attending: Neurological Surgery

## 2020-07-27 ENCOUNTER — Inpatient Hospital Stay (HOSPITAL_COMMUNITY)
Admission: EM | Admit: 2020-07-27 | Discharge: 2020-07-31 | DRG: 025 | Disposition: A | Discharge status: Rehabilitation Facility | Payer: Medicare Other | Attending: Neurological Surgery | Admitting: Neurological Surgery

## 2020-07-27 ENCOUNTER — Inpatient Hospital Stay (HOSPITAL_COMMUNITY): Admit: 2020-07-27 | Discharge: 2020-07-27 | Disposition: A | Discharge status: Home / Self Care | Payer: Medicare Other

## 2020-07-27 DIAGNOSIS — S065X0A Traumatic subdural hemorrhage without loss of consciousness, initial encounter: Secondary | ICD-10-CM | POA: Diagnosis not present

## 2020-07-27 DIAGNOSIS — S066X9A Traumatic subarachnoid hemorrhage with loss of consciousness of unspecified duration, initial encounter: Secondary | ICD-10-CM | POA: Diagnosis not present

## 2020-07-27 DIAGNOSIS — W1839XA Other fall on same level, initial encounter: Secondary | ICD-10-CM | POA: Diagnosis present

## 2020-07-27 DIAGNOSIS — I4819 Other persistent atrial fibrillation: Secondary | ICD-10-CM | POA: Diagnosis not present

## 2020-07-27 DIAGNOSIS — I6601 Occlusion and stenosis of right middle cerebral artery: Secondary | ICD-10-CM | POA: Diagnosis not present

## 2020-07-27 DIAGNOSIS — S066X0A Traumatic subarachnoid hemorrhage without loss of consciousness, initial encounter: Secondary | ICD-10-CM | POA: Diagnosis not present

## 2020-07-27 DIAGNOSIS — Z888 Allergy status to other drugs, medicaments and biological substances status: Secondary | ICD-10-CM | POA: Diagnosis not present

## 2020-07-27 DIAGNOSIS — R4189 Other symptoms and signs involving cognitive functions and awareness: Secondary | ICD-10-CM | POA: Diagnosis present

## 2020-07-27 DIAGNOSIS — R400 Somnolence: Secondary | ICD-10-CM | POA: Diagnosis not present

## 2020-07-27 DIAGNOSIS — R791 Abnormal coagulation profile: Secondary | ICD-10-CM | POA: Diagnosis present

## 2020-07-27 DIAGNOSIS — S065X9A Traumatic subdural hemorrhage with loss of consciousness of unspecified duration, initial encounter: Principal | ICD-10-CM | POA: Diagnosis present

## 2020-07-27 DIAGNOSIS — Z8673 Personal history of transient ischemic attack (TIA), and cerebral infarction without residual deficits: Secondary | ICD-10-CM | POA: Diagnosis not present

## 2020-07-27 DIAGNOSIS — I484 Atypical atrial flutter: Secondary | ICD-10-CM | POA: Diagnosis present

## 2020-07-27 DIAGNOSIS — Y9222 Religious institution as the place of occurrence of the external cause: Secondary | ICD-10-CM

## 2020-07-27 DIAGNOSIS — I4891 Unspecified atrial fibrillation: Secondary | ICD-10-CM | POA: Diagnosis not present

## 2020-07-27 DIAGNOSIS — R21 Rash and other nonspecific skin eruption: Secondary | ICD-10-CM | POA: Diagnosis not present

## 2020-07-27 DIAGNOSIS — E876 Hypokalemia: Secondary | ICD-10-CM | POA: Diagnosis not present

## 2020-07-27 DIAGNOSIS — Z91013 Allergy to seafood: Secondary | ICD-10-CM

## 2020-07-27 DIAGNOSIS — R4701 Aphasia: Secondary | ICD-10-CM | POA: Diagnosis present

## 2020-07-27 DIAGNOSIS — S065XAA Traumatic subdural hemorrhage with loss of consciousness status unknown, initial encounter: Secondary | ICD-10-CM | POA: Diagnosis present

## 2020-07-27 DIAGNOSIS — Z20822 Contact with and (suspected) exposure to covid-19: Secondary | ICD-10-CM | POA: Diagnosis not present

## 2020-07-27 DIAGNOSIS — S065X2S Traumatic subdural hemorrhage with loss of consciousness of 31 minutes to 59 minutes, sequela: Secondary | ICD-10-CM | POA: Diagnosis not present

## 2020-07-27 DIAGNOSIS — G9349 Other encephalopathy: Secondary | ICD-10-CM | POA: Diagnosis not present

## 2020-07-27 DIAGNOSIS — I110 Hypertensive heart disease with heart failure: Secondary | ICD-10-CM | POA: Diagnosis not present

## 2020-07-27 DIAGNOSIS — I10 Essential (primary) hypertension: Secondary | ICD-10-CM | POA: Diagnosis not present

## 2020-07-27 DIAGNOSIS — D72829 Elevated white blood cell count, unspecified: Secondary | ICD-10-CM | POA: Diagnosis not present

## 2020-07-27 DIAGNOSIS — I63511 Cerebral infarction due to unspecified occlusion or stenosis of right middle cerebral artery: Secondary | ICD-10-CM | POA: Diagnosis not present

## 2020-07-27 DIAGNOSIS — Z7901 Long term (current) use of anticoagulants: Secondary | ICD-10-CM | POA: Diagnosis not present

## 2020-07-27 DIAGNOSIS — W19XXXD Unspecified fall, subsequent encounter: Secondary | ICD-10-CM | POA: Diagnosis present

## 2020-07-27 DIAGNOSIS — R4182 Altered mental status, unspecified: Secondary | ICD-10-CM | POA: Diagnosis not present

## 2020-07-27 DIAGNOSIS — S065X3S Traumatic subdural hemorrhage with loss of consciousness of 1 hour to 5 hours 59 minutes, sequela: Secondary | ICD-10-CM | POA: Diagnosis not present

## 2020-07-27 DIAGNOSIS — T380X5A Adverse effect of glucocorticoids and synthetic analogues, initial encounter: Secondary | ICD-10-CM | POA: Diagnosis not present

## 2020-07-27 DIAGNOSIS — S065X9S Traumatic subdural hemorrhage with loss of consciousness of unspecified duration, sequela: Secondary | ICD-10-CM | POA: Diagnosis not present

## 2020-07-27 DIAGNOSIS — I517 Cardiomegaly: Secondary | ICD-10-CM | POA: Diagnosis not present

## 2020-07-27 DIAGNOSIS — I5032 Chronic diastolic (congestive) heart failure: Secondary | ICD-10-CM | POA: Diagnosis present

## 2020-07-27 DIAGNOSIS — Z96641 Presence of right artificial hip joint: Secondary | ICD-10-CM | POA: Diagnosis present

## 2020-07-27 DIAGNOSIS — Z79899 Other long term (current) drug therapy: Secondary | ICD-10-CM | POA: Diagnosis not present

## 2020-07-27 DIAGNOSIS — I509 Heart failure, unspecified: Secondary | ICD-10-CM | POA: Diagnosis not present

## 2020-07-27 DIAGNOSIS — G8194 Hemiplegia, unspecified affecting left nondominant side: Secondary | ICD-10-CM | POA: Diagnosis not present

## 2020-07-27 DIAGNOSIS — N390 Urinary tract infection, site not specified: Secondary | ICD-10-CM | POA: Diagnosis not present

## 2020-07-27 DIAGNOSIS — J9601 Acute respiratory failure with hypoxia: Secondary | ICD-10-CM | POA: Diagnosis not present

## 2020-07-27 DIAGNOSIS — D62 Acute posthemorrhagic anemia: Secondary | ICD-10-CM | POA: Diagnosis not present

## 2020-07-27 DIAGNOSIS — Z66 Do not resuscitate: Secondary | ICD-10-CM | POA: Diagnosis not present

## 2020-07-27 DIAGNOSIS — W19XXXS Unspecified fall, sequela: Secondary | ICD-10-CM | POA: Diagnosis present

## 2020-07-27 DIAGNOSIS — Z833 Family history of diabetes mellitus: Secondary | ICD-10-CM

## 2020-07-27 DIAGNOSIS — A499 Bacterial infection, unspecified: Secondary | ICD-10-CM | POA: Diagnosis not present

## 2020-07-27 DIAGNOSIS — I63311 Cerebral infarction due to thrombosis of right middle cerebral artery: Secondary | ICD-10-CM | POA: Diagnosis not present

## 2020-07-27 DIAGNOSIS — Z953 Presence of xenogenic heart valve: Secondary | ICD-10-CM

## 2020-07-27 DIAGNOSIS — R58 Hemorrhage, not elsewhere classified: Secondary | ICD-10-CM | POA: Diagnosis not present

## 2020-07-27 DIAGNOSIS — I712 Thoracic aortic aneurysm, without rupture: Secondary | ICD-10-CM | POA: Diagnosis present

## 2020-07-27 DIAGNOSIS — Y92239 Unspecified place in hospital as the place of occurrence of the external cause: Secondary | ICD-10-CM | POA: Diagnosis present

## 2020-07-27 DIAGNOSIS — S065X0D Traumatic subdural hemorrhage without loss of consciousness, subsequent encounter: Secondary | ICD-10-CM | POA: Diagnosis not present

## 2020-07-27 DIAGNOSIS — R1312 Dysphagia, oropharyngeal phase: Secondary | ICD-10-CM | POA: Diagnosis not present

## 2020-07-27 DIAGNOSIS — Z9889 Other specified postprocedural states: Secondary | ICD-10-CM

## 2020-07-27 DIAGNOSIS — I616 Nontraumatic intracerebral hemorrhage, multiple localized: Secondary | ICD-10-CM | POA: Diagnosis not present

## 2020-07-27 DIAGNOSIS — I63031 Cerebral infarction due to thrombosis of right carotid artery: Secondary | ICD-10-CM | POA: Diagnosis not present

## 2020-07-27 DIAGNOSIS — Z9181 History of falling: Secondary | ICD-10-CM | POA: Diagnosis not present

## 2020-07-27 DIAGNOSIS — I714 Abdominal aortic aneurysm, without rupture: Secondary | ICD-10-CM | POA: Diagnosis present

## 2020-07-27 DIAGNOSIS — I639 Cerebral infarction, unspecified: Secondary | ICD-10-CM | POA: Diagnosis not present

## 2020-07-27 DIAGNOSIS — Z886 Allergy status to analgesic agent status: Secondary | ICD-10-CM | POA: Diagnosis not present

## 2020-07-27 DIAGNOSIS — J9811 Atelectasis: Secondary | ICD-10-CM | POA: Diagnosis not present

## 2020-07-27 DIAGNOSIS — E785 Hyperlipidemia, unspecified: Secondary | ICD-10-CM | POA: Diagnosis not present

## 2020-07-27 DIAGNOSIS — I6201 Nontraumatic acute subdural hemorrhage: Secondary | ICD-10-CM | POA: Diagnosis not present

## 2020-07-27 DIAGNOSIS — N138 Other obstructive and reflux uropathy: Secondary | ICD-10-CM | POA: Diagnosis present

## 2020-07-27 DIAGNOSIS — R7303 Prediabetes: Secondary | ICD-10-CM | POA: Diagnosis present

## 2020-07-27 DIAGNOSIS — S0003XA Contusion of scalp, initial encounter: Secondary | ICD-10-CM | POA: Diagnosis not present

## 2020-07-27 DIAGNOSIS — I482 Chronic atrial fibrillation, unspecified: Secondary | ICD-10-CM | POA: Diagnosis not present

## 2020-07-27 DIAGNOSIS — N400 Enlarged prostate without lower urinary tract symptoms: Secondary | ICD-10-CM | POA: Diagnosis present

## 2020-07-27 DIAGNOSIS — I440 Atrioventricular block, first degree: Secondary | ICD-10-CM | POA: Diagnosis present

## 2020-07-27 DIAGNOSIS — G479 Sleep disorder, unspecified: Secondary | ICD-10-CM | POA: Diagnosis not present

## 2020-07-27 DIAGNOSIS — J9600 Acute respiratory failure, unspecified whether with hypoxia or hypercapnia: Secondary | ICD-10-CM | POA: Diagnosis not present

## 2020-07-27 DIAGNOSIS — J3489 Other specified disorders of nose and nasal sinuses: Secondary | ICD-10-CM | POA: Diagnosis not present

## 2020-07-27 DIAGNOSIS — G935 Compression of brain: Secondary | ICD-10-CM | POA: Diagnosis not present

## 2020-07-27 DIAGNOSIS — R414 Neurologic neglect syndrome: Secondary | ICD-10-CM | POA: Diagnosis not present

## 2020-07-27 DIAGNOSIS — S069X3S Unspecified intracranial injury with loss of consciousness of 1 hour to 5 hours 59 minutes, sequela: Secondary | ICD-10-CM | POA: Diagnosis not present

## 2020-07-27 DIAGNOSIS — T45515A Adverse effect of anticoagulants, initial encounter: Secondary | ICD-10-CM | POA: Diagnosis present

## 2020-07-27 DIAGNOSIS — R55 Syncope and collapse: Secondary | ICD-10-CM | POA: Diagnosis not present

## 2020-07-27 LAB — I-STAT CHEM 8, ED
BUN: 17 mg/dL (ref 8–23)
Calcium, Ion: 1.14 mmol/L — ABNORMAL LOW (ref 1.15–1.40)
Chloride: 107 mmol/L (ref 98–111)
Creatinine, Ser: 0.9 mg/dL (ref 0.61–1.24)
Glucose, Bld: 138 mg/dL — ABNORMAL HIGH (ref 70–99)
HCT: 37 % — ABNORMAL LOW (ref 39.0–52.0)
Hemoglobin: 12.6 g/dL — ABNORMAL LOW (ref 13.0–17.0)
Potassium: 4 mmol/L (ref 3.5–5.1)
Sodium: 141 mmol/L (ref 135–145)
TCO2: 25 mmol/L (ref 22–32)

## 2020-07-27 LAB — CBC
HCT: 38.4 % — ABNORMAL LOW (ref 39.0–52.0)
Hemoglobin: 12.7 g/dL — ABNORMAL LOW (ref 13.0–17.0)
MCH: 31.1 pg (ref 26.0–34.0)
MCHC: 33.1 g/dL (ref 30.0–36.0)
MCV: 94.1 fL (ref 80.0–100.0)
Platelets: 216 10*3/uL (ref 150–400)
RBC: 4.08 MIL/uL — ABNORMAL LOW (ref 4.22–5.81)
RDW: 14.4 % (ref 11.5–15.5)
WBC: 14.7 10*3/uL — ABNORMAL HIGH (ref 4.0–10.5)
nRBC: 0 % (ref 0.0–0.2)

## 2020-07-27 LAB — I-STAT ARTERIAL BLOOD GAS, ED
Acid-Base Excess: 2 mmol/L (ref 0.0–2.0)
Bicarbonate: 27 mmol/L (ref 20.0–28.0)
Calcium, Ion: 1.16 mmol/L (ref 1.15–1.40)
HCT: 34 % — ABNORMAL LOW (ref 39.0–52.0)
Hemoglobin: 11.6 g/dL — ABNORMAL LOW (ref 13.0–17.0)
O2 Saturation: 100 %
Patient temperature: 98.1
Potassium: 3.8 mmol/L (ref 3.5–5.1)
Sodium: 140 mmol/L (ref 135–145)
TCO2: 28 mmol/L (ref 22–32)
pCO2 arterial: 40.2 mmHg (ref 32.0–48.0)
pH, Arterial: 7.434 (ref 7.350–7.450)
pO2, Arterial: 504 mmHg — ABNORMAL HIGH (ref 83.0–108.0)

## 2020-07-27 LAB — DIFFERENTIAL
Abs Immature Granulocytes: 0.05 10*3/uL (ref 0.00–0.07)
Basophils Absolute: 0 10*3/uL (ref 0.0–0.1)
Basophils Relative: 0 %
Eosinophils Absolute: 0 10*3/uL (ref 0.0–0.5)
Eosinophils Relative: 0 %
Immature Granulocytes: 0 %
Lymphocytes Relative: 10 %
Lymphs Abs: 1.4 10*3/uL (ref 0.7–4.0)
Monocytes Absolute: 1.5 10*3/uL — ABNORMAL HIGH (ref 0.1–1.0)
Monocytes Relative: 10 %
Neutro Abs: 11.7 10*3/uL — ABNORMAL HIGH (ref 1.7–7.7)
Neutrophils Relative %: 80 %

## 2020-07-27 LAB — PROTIME-INR
INR: 3.4 — ABNORMAL HIGH (ref 0.8–1.2)
Prothrombin Time: 34.7 seconds — ABNORMAL HIGH (ref 11.4–15.2)

## 2020-07-27 LAB — COMPREHENSIVE METABOLIC PANEL
ALT: 33 U/L (ref 0–44)
AST: 34 U/L (ref 15–41)
Albumin: 3.8 g/dL (ref 3.5–5.0)
Alkaline Phosphatase: 65 U/L (ref 38–126)
Anion gap: 8 (ref 5–15)
BUN: 16 mg/dL (ref 8–23)
CO2: 23 mmol/L (ref 22–32)
Calcium: 8.8 mg/dL — ABNORMAL LOW (ref 8.9–10.3)
Chloride: 107 mmol/L (ref 98–111)
Creatinine, Ser: 0.94 mg/dL (ref 0.61–1.24)
GFR, Estimated: >60 mL/min (ref >60)
Glucose, Bld: 140 mg/dL — ABNORMAL HIGH (ref 70–99)
Potassium: 4.1 mmol/L (ref 3.5–5.1)
Sodium: 138 mmol/L (ref 135–145)
Total Bilirubin: 0.9 mg/dL (ref 0.3–1.2)
Total Protein: 7 g/dL (ref 6.5–8.1)

## 2020-07-27 LAB — CBG MONITORING, ED: Glucose-Capillary: 125 mg/dL — ABNORMAL HIGH (ref 70–99)

## 2020-07-27 LAB — RESP PANEL BY RT-PCR (FLU A&B, COVID) ARPGX2
Influenza A by PCR: NEGATIVE
Influenza B by PCR: NEGATIVE
SARS Coronavirus 2 by RT PCR: NEGATIVE

## 2020-07-27 LAB — APTT: aPTT: 45 seconds — ABNORMAL HIGH (ref 24–36)

## 2020-07-27 SURGERY — CRANIOTOMY HEMATOMA EVACUATION SUBDURAL
Anesthesia: General | Site: Head | Laterality: Right

## 2020-07-27 MED ORDER — PROPOFOL 1000 MG/100ML IV EMUL
INTRAVENOUS | Status: AC | PRN
  Administered 2020-07-27: 2790 ug via INTRAVENOUS

## 2020-07-27 MED ORDER — THROMBIN 20000 UNITS EX SOLR
CUTANEOUS | Status: AC
  Filled 2020-07-27: qty 20000

## 2020-07-27 MED ORDER — THROMBIN 5000 UNITS EX SOLR
CUTANEOUS | Status: AC
  Filled 2020-07-27: qty 5000

## 2020-07-27 MED ORDER — LIDOCAINE-EPINEPHRINE 1 %-1:100000 IJ SOLN
INTRAMUSCULAR | Status: AC
  Filled 2020-07-27: qty 1

## 2020-07-27 MED ORDER — SUCCINYLCHOLINE CHLORIDE 20 MG/ML IJ SOLN
INTRAMUSCULAR | Status: AC | PRN
  Administered 2020-07-27: 120 mg via INTRAVENOUS

## 2020-07-27 MED ORDER — BACITRACIN ZINC 500 UNIT/GM EX OINT
TOPICAL_OINTMENT | CUTANEOUS | Status: AC
  Filled 2020-07-27: qty 28.35

## 2020-07-27 MED ORDER — PROPOFOL 1000 MG/100ML IV EMUL
INTRAVENOUS | Status: AC
  Administered 2020-07-27: 50 ug/kg/min via INTRAVENOUS
  Filled 2020-07-27: qty 100

## 2020-07-27 MED ORDER — SODIUM CHLORIDE 0.9% FLUSH: 3.0000 mL | Freq: Once | INTRAVENOUS | Status: DC

## 2020-07-27 MED ORDER — ETOMIDATE 2 MG/ML IV SOLN
INTRAVENOUS | Status: AC | PRN
  Administered 2020-07-27: 20 mg via INTRAVENOUS

## 2020-07-27 MED ORDER — VITAMIN K1 10 MG/ML IJ SOLN
10.0000 mg | INTRAVENOUS | Status: AC
  Administered 2020-07-27: 10 mg via INTRAVENOUS
  Filled 2020-07-27: qty 1

## 2020-07-27 MED ORDER — PROPOFOL 1000 MG/100ML IV EMUL
5.0000 ug/kg/min | INTRAVENOUS | Status: DC
  Administered 2020-07-27 – 2020-07-28 (×2): 30 ug/kg/min via INTRAVENOUS
  Administered 2020-07-28: 10 ug/kg/min via INTRAVENOUS
  Filled 2020-07-27 (×2): qty 100

## 2020-07-27 MED ORDER — PROTHROMBIN COMPLEX CONC HUMAN 500 UNITS IV KIT
1594.0000 [IU] | PACK | Status: AC
  Administered 2020-07-27: 1594 [IU] via INTRAVENOUS
  Filled 2020-07-27: qty 1094

## 2020-07-27 NOTE — ED Provider Notes (Signed)
[facility] EMERGENCY DEPARTMENT Provider Note   CSN: [id number] Arrival date & time: 07/27/20  1826  An emergency department physician performed an initial assessment on this suspected stroke patient at [facility] (Simultaneous filing. User may not have seen previous data.).  History No chief complaint on file.   [name] [name] is a 80 y.o. male.  80 year old male with prior medical history as detailed below presents as a code stroke.  Patient was seen yesterday after fall with head injury.  Scans yesterday were without evidence of acute bleed.  Patient apparently was fine through the course of today.  Around 4 PM he went to sleep for a nap.  Upon awakening from the nap family report that he was significantly altered.  EMS was called for transport.  EMS reports significant decline during transport.  Patient now unresponsive with poor airway reflexes upon initial evaluation.  The history is provided by the patient and medical records.  Illness Location:  AMS, on Coumadin, head injury 24 hours prior. Severity:  Severe Onset quality:  Sudden Duration:  3 hours Timing:  Constant Progression:  Worsening Chronicity:  New      Past Medical History:  Diagnosis Date  . Acute rheumatic heart disease, unspecified    childhood, age  82 & 41  . Acute rheumatic pericarditis   . Atrial fibrillation ([place])    history  . CHF (congestive heart failure) ([place])   . Diverticulosis   . Dysrhythmia   . 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 External hemorrhoids without mention of complication   . H/O aortic valve replacement   . H/O mitral valve replacement   . 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 Lesion of ulnar nerve   . 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 Other and unspecified hyperlipidemia   . Pre-diabetes   . Previous back surgery 1978, jan 2007  . Psychosexual dysfunction with inhibited sexual excitement   . SOB (shortness of breath)    "with heavy  exercise"  . Stroke ([place]) 08/2013   "I WAS IN AFIB AND THREW A CLOT, THE EFFECTS WERE TRANSITORY AND DIDNT LAST BUT FOR 30 MINUTES"   . Thoracic aortic aneurysm [facility])     Patient Active Problem List   Diagnosis Date Noted  . Subdural hematoma ([place]) 07/27/2020  . Vertigo 08/30/2018  . Obese 04/04/2018  . 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 Long term (current) use of anticoagulants 01/05/2018  . History of food allergy 12/11/2017  . Allergic reaction 12/11/2017  . Emesis 12/11/2017  . Visit for monitoring Tikosyn therapy 06/06/2017  . Internal hemorrhoid, bleeding 05/19/2016  . 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  . 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  . Aortic valve disorder 07/09/2015  . Long term current use of anticoagulant therapy 01/19/2015  . Chronic a-fib ([place]) 01/20/2014  . Elevated PSA 12/23/2013  . Prediabetes 12/19/2013  . Benign prostatic hyperplasia with urinary obstruction 10/29/2013  . 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 Atrial flutter ([place]) 05/17/2013  . Essential hypertension 06/19/2012  . Routine health maintenance 04/26/2011  . Hyperlipidemia with target LDL less than 130 07/07/2008  . History of colonic polyps 06/16/2008  . ERECTILE DYSFUNCTION 09/25/2007    Past Surgical History:  Procedure Laterality Date  . AORTIC AND MITRAL VALVE REPLACEMENT     09/2004  . CARDIOVERSION     3 times from 2004-2006  .  CARDIOVERSION N/A 09/26/2013   Procedure: CARDIOVERSION;  Surgeon: Larey Dresser, MD;  Location: [facility];  Service: Cardiovascular;  Laterality: N/A;  . CARDIOVERSION N/A 06/19/2014   Procedure: CARDIOVERSION;  Surgeon: Jerline Pain, MD;  Location: [facility];  Service: Cardiovascular;  Laterality: N/A;  . CARDIOVERSION N/A 04/24/2017   Procedure: CARDIOVERSION;  Surgeon: Larey Dresser, MD;  Location: [facility] ENDOSCOPY;  Service: Cardiovascular;  Laterality: N/A;  . CARDIOVERSION N/A 06/08/2017   Procedure:  CARDIOVERSION;  Surgeon: Pixie Casino, MD;  Location: [facility] ENDOSCOPY;  Service: Cardiovascular;  Laterality: N/A;  . CARDIOVERSION N/A 08/24/2017   Procedure: CARDIOVERSION;  Surgeon: Skeet Latch, MD;  Location: [facility] ENDOSCOPY;  Service: Cardiovascular;  Laterality: N/A;  . CARDIOVERSION N/A 02/14/2018   Procedure: CARDIOVERSION;  Surgeon: Larey Dresser, MD;  Location: [facility] ENDOSCOPY;  Service: Cardiovascular;  Laterality: N/A;  . COLONOSCOPY    . laminectomies     10/1975 and in 03/2005  . TONSILLECTOMY     1950  . TOTAL HIP ARTHROPLASTY Right 04/03/2018   Procedure: TOTAL HIP ARTHROPLASTY ANTERIOR APPROACH;  Surgeon: Paralee Cancel, MD;  Location: [facility] ORS;  Service: Orthopedics;  Laterality: Right;  70 mins       Family History  Problem Relation Age of Onset  . Macular degeneration Mother        macular degeneration  . Cancer Father        intestinal/GI  . Diabetes Paternal Aunt   . Heart attack Maternal Grandfather   . Diabetes Brother     Social History   Tobacco Use  . Smoking status: Never Smoker  . Smokeless tobacco: Never Used  Vaping Use  . Vaping Use: Never used  Substance Use Topics  . Alcohol use: Never    Comment: wine occasionally  . Drug use: Never    Home Medications Prior to Admission medications   Medication Sig Start Date End Date Taking? Authorizing Provider  atorvastatin (LIPITOR) 80 MG tablet TAKE 1 TABLET (80 MG TOTAL) BY MOUTH EVERY EVENING. 04/07/20   Larey Dresser, MD  atorvastatin (LIPITOR) 80 MG tablet Take 1 tablet by mouth daily. 06/29/20   [provider]  clobetasol cream (TEMOVATE) [id number] % Apply 1 application topically daily as needed (for skin irritation).  05/20/14   [provider]  Coenzyme Q10 (CO Q-10) 300 MG CAPS Take 300 mg by mouth daily.    [provider]  COVID-19 mRNA vaccine, Moderna, 100 MCG/0.5ML injection Inject into the muscle. 06/26/20   Carlyle Basques, MD  Cyanocobalamin (VITAMIN B-12)  5000 MCG SUBL Place 5,000 mcg under the tongue daily at 2 PM.     [provider]  docusate sodium (COLACE) 100 MG capsule Take 100 mg by mouth daily.    [provider]  dofetilide (TIKOSYN) 500 MCG capsule Take 1 capsule (500 mcg total) by mouth 2 (two) times daily. 03/26/20   Larey Dresser, MD  dofetilide (TIKOSYN) 500 MCG capsule Take 500 mcg by mouth every 12 (twelve) hours. 05/20/20   [provider]  finasteride (PROSCAR) 5 MG tablet Take 5 mg by mouth daily.    [provider]  furosemide (LASIX) 20 MG tablet Take 1 tablet (20 mg total) by mouth daily. 03/26/20   Larey Dresser, MD  Glucosamine-Chondroitin (COSAMIN DS PO) Take 1 tablet by mouth 2 (two) times daily.    [provider]  loratadine (CLARITIN) 10 MG tablet Take 10 mg by mouth daily as needed for  allergies.     [provider]  losartan (COZAAR) 25 MG tablet Take 1 tablet (25 mg total) by mouth daily. 03/26/20   Larey Dresser, MD  losartan (COZAAR) 25 MG tablet Take 25 mg by mouth daily. 04/06/20   [provider]  metoprolol succinate (TOPROL-XL) 50 MG 24 hr tablet Take 75 mg (1.5 tabs) by mouth twice daily. Take with or immediately following a meal. 05/22/20   Larey Dresser, MD  metoprolol succinate (TOPROL-XL) 50 MG 24 hr tablet Take 50 mg by mouth every 12 (twelve) hours. 05/31/20   [provider]  Multiple Vitamins-Minerals (MULTIVITAL-M) TABS Take 1 tablet by mouth daily.     [provider]  potassium chloride SA (KLOR-CON) 20 MEQ tablet Take 1 tablet (20 mEq total) by mouth daily. 03/26/20   Larey Dresser, MD  potassium chloride SA (KLOR-CON) 20 MEQ tablet Take 20 mEq by mouth daily. 04/22/20   [provider]  Probiotic Product (PROBIOTIC FORMULA PO) Take 1 capsule by mouth 2 (two) times daily.     [provider]  tamsulosin (FLOMAX) 0.4 MG CAPS capsule Take 0.4 mg by mouth 2 (two) times daily. 09/14/18   [provider]  tamsulosin (FLOMAX) 0.4 MG CAPS capsule Take 0.8 mg by mouth every 12 (twelve) hours. 07/07/20   [provider]  warfarin (COUMADIN) 5 MG tablet As directed 03/26/20   Larey Dresser, MD  warfarin (COUMADIN) 5 MG tablet Take 5 mg by mouth daily. 07/22/20   [provider]    Allergies    Naproxen and No healthtouch food allergies  Review of Systems   Review of Systems  Unable to perform ROS: Acuity of condition    Physical Exam Updated Vital Signs BP (!) 156/92   Pulse 67   Temp 98.1 F (36.7 C) (Oral)   Resp (!) 22   Wt 93 kg   SpO2 98%   BMI 32.11 kg/m   Physical Exam Vitals and nursing note reviewed.  Constitutional:      General: He is not in acute distress.    Appearance: He is well-developed.     Comments: obtunded, some pooling secretions noted in posterior pharynx  HENT:     Head: Normocephalic and atraumatic.  Eyes:     Conjunctiva/sclera: Conjunctivae normal.     Pupils: Pupils are equal, round, and reactive to light.  Cardiovascular:     Rate and Rhythm: Normal rate and regular rhythm.     Heart sounds: Normal heart sounds.  Pulmonary:     Effort: Pulmonary effort is normal. No respiratory distress.     Breath sounds: Normal breath sounds.  Abdominal:     General: There is no distension.     Palpations: Abdomen is soft.     Tenderness: There is no abdominal tenderness.  Musculoskeletal:        General: No deformity.     Cervical back: Normal range of motion and neck supple.  Skin:    General: Skin is warm and dry.  Neurological:     Comments: Obtunded, not following commands, eyes closed   Minimal gag reflex  Increased pain in his right upper extremity with flexion withdrawal to noxious stimuli  Withdrawal to noxious stimuli in all 4 extremities       ED Results / Procedures / Treatments   Labs (all labs ordered are listed, but only abnormal results are displayed) Labs Reviewed  PROTIME-INR - Abnormal;  Notable for  the following components:      Result Value   Prothrombin Time 34.7 (*)    INR 3.4 (*)    All other components within normal limits  APTT - Abnormal; Notable for the following components:   aPTT 45 (*)    All other components within normal limits  CBC - Abnormal; Notable for the following components:   WBC 14.7 (*)    RBC 4.08 (*)    Hemoglobin 12.7 (*)    HCT 38.4 (*)    All other components within normal limits  DIFFERENTIAL - Abnormal; Notable for the following components:   Neutro Abs 11.7 (*)    Monocytes Absolute 1.5 (*)    All other components within normal limits  COMPREHENSIVE METABOLIC PANEL - Abnormal; Notable for the following components:   Glucose, Bld 140 (*)    Calcium 8.8 (*)    All other components within normal limits  CBG MONITORING, ED - Abnormal; Notable for the following components:   Glucose-Capillary 125 (*)    All other components within normal limits  I-STAT CHEM 8, ED - Abnormal; Notable for the following components:   Glucose, Bld 138 (*)    Calcium, Ion 1.14 (*)    Hemoglobin 12.6 (*)    HCT 37.0 (*)    All other components within normal limits  I-STAT ARTERIAL BLOOD GAS, ED - Abnormal; Notable for the following components:   pO2, Arterial 504 (*)    HCT 34.0 (*)    Hemoglobin 11.6 (*)    All other components within normal limits  RESP PANEL BY RT-PCR (FLU A&B, COVID) ARPGX2  TRIGLYCERIDES  PROTIME-INR  BLOOD GAS, ARTERIAL  CBG MONITORING, ED    EKG None  Radiology CT Head Wo Contrast  Result Date: 07/26/2020 CLINICAL DATA:  Fall, on blood thinners.  Posterior hematoma EXAM: CT HEAD WITHOUT CONTRAST TECHNIQUE: Contiguous axial images were obtained from the base of the skull through the vertex without intravenous contrast. COMPARISON:  08/05/2013 FINDINGS: Brain: Old infarct in the posterior left parietal region, stable. No acute intracranial abnormality. Specifically, no hemorrhage, hydrocephalus, mass lesion, acute infarction,  or significant intracranial injury. Vascular: No hyperdense vessel or unexpected calcification. Skull: No acute calvarial abnormality. Sinuses/Orbits: Mucosal thickening in the right maxillary sinus. No air-fluid levels. Other: Posterior scalp hematoma in the left occipital region. 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 No acute intracranial abnormality. Electronically Signed   By: Rolm Baptise M.D.   On: 07/26/2020 13:14   CT Cervical Spine Wo Contrast  Result Date: 07/26/2020 CLINICAL DATA:  Fall, on blood thinners. EXAM: CT CERVICAL SPINE WITHOUT CONTRAST TECHNIQUE: Multidetector CT imaging of the cervical spine was performed without intravenous contrast. Multiplanar CT image reconstructions were also generated. COMPARISON:  None FINDINGS: Alignment: No subluxation. Skull base and vertebrae: No acute fracture. No primary bone lesion or focal pathologic process. Soft tissues and spinal canal: No prevertebral fluid or swelling. No visible canal hematoma. Disc levels: Diffuse degenerative disc disease with disc space narrowing and spurring. Diffuse degenerative facet disease bilaterally. Upper chest: No acute findings. Other: None IMPRESSION: Diffuse degenerative disc and facet disease. No acute bony abnormality. Electronically Signed   By: Rolm Baptise M.D.   On: 07/26/2020 13:15   DG Chest Portable 1 View  Result Date: 07/27/2020 CLINICAL DATA:  80 year old male status post intubation. EXAM: PORTABLE CHEST 1 VIEW COMPARISON:  Chest radiograph dated 07/26/2020. FINDINGS: Endotracheal tube with tip approximately 4.5 cm above the carina. Enteric tube extends below the  diaphragm with tip beyond the inferior margin of the image. Left lung base atelectasis versus less likely infiltrate. There is no pleural effusion pneumothorax. Stable cardiomegaly. 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 A battery pack noted over the left chest. Atherosclerotic calcification of the aorta. No acute  osseous pathology. IMPRESSION: 1. Endotracheal tube above the carina. 2. Left lung base atelectasis versus less likely infiltrate. Electronically Signed   By: Anner Crete M.D.   On: 07/27/2020 20:31   DG Chest Portable 1 View  Result Date: 07/26/2020 CLINICAL DATA:  Syncope EXAM: PORTABLE CHEST 1 VIEW COMPARISON:  08/06/2013 FINDINGS: Prior sternotomy and cardiac valve replacement. Cardiomegaly. Atherosclerotic calcification of the aortic knob. Mild pulmonary vascular congestion. No focal airspace consolidation, pleural effusion, or pneumothorax. IMPRESSION: Cardiomegaly and mild pulmonary vascular congestion. Electronically Signed   By: Davina Poke D.O.   On: 07/26/2020 13:08   CT HEAD CODE STROKE WO CONTRAST  Result Date: 07/27/2020 CLINICAL DATA:  Code stroke. EXAM: CT HEAD WITHOUT CONTRAST TECHNIQUE: Contiguous axial images were obtained from the base of the skull through the vertex without intravenous contrast. COMPARISON:  07/26/2020 FINDINGS: Brain: Acute, mixed density right cerebral convexity subdural hematoma measuring up to 2.2 cm in thickness. Areas of low density likely due to presence of anticoagulation. Additional acute subdural hematoma underlying the posterior left cerebral convexity measuring up to 1.1 cm in thickness. Small volume subdural hemorrhage is present along the right aspect of the falx and tentorium. There is mass effect with regional sulcal effacement. Subfalcine herniation is present with leftward midline shift measuring up to 1.8 cm. There is early trapping of the left lateral ventricle. Partial effacement of the suprasellar cistern. No acute appearing loss of gray-white differentiation. Chronic left parietotemporal infarct with expected dilatation of the posterior left lateral ventricle. Vascular: Atherosclerotic calcification at the skull base. Skull: Calvarium is intact. Sinuses/Orbits: Right maxillary sinus mucosal thickening. Other: Mastoid air cells are clear.  Left posterior scalp soft tissue swelling. IMPRESSION: Acute right much larger than left subdural hematomas. Additional small volume subdural hemorrhage along the falx and tentorium. Mass effect including subfalcine herniation with leftward midline shift of 1.8 cm and early trapping of the left lateral ventricle. These results were communicated to Dr. Leonel Ramsay at 6:46 pm on 07/27/2020 by text page via the [facility] messaging system. Electronically Signed   By: Macy Mis M.D.   On: 07/27/2020 18:54    Procedures Procedure Name: Intubation Date/Time: 07/27/2020 8:52 PM Performed by: Valarie Merino, MD Pre-anesthesia Checklist: Patient identified, Patient being monitored, Emergency Drugs available, Timeout performed and Suction available Oxygen Delivery Method: Non-rebreather mask Preoxygenation: Pre-oxygenation with 100% oxygen Induction Type: Rapid sequence Ventilation: Mask ventilation without difficulty Laryngoscope Size: Glidescope and 4 Grade View: Grade I Tube size: 7.5 mm Number of attempts: 1 Airway Equipment and Method: Patient positioned with wedge pillow Placement Confirmation: ETT inserted through vocal cords under direct vision,  CO2 detector and Breath sounds checked- equal and bilateral Secured at: 24 cm Tube secured with: ETT holder       CRITICAL CARE Performed by: Valarie Merino   Total critical care time: 45 minutes  Critical care time was exclusive of separately billable procedures and treating other patients.  Critical care was necessary to treat or prevent imminent or life-threatening deterioration.  Critical care was time spent personally by me on the following activities: development of treatment plan with patient and/or surrogate as well as nursing, discussions with consultants, evaluation of patient's response to treatment,  examination of patient, obtaining history from patient or surrogate, ordering and performing treatments and interventions, ordering  and review of laboratory studies, ordering and review of radiographic studies, pulse oximetry and re-evaluation of patient's condition.    Medications Ordered in ED Medications  sodium chloride flush (NS) 0.9 % injection 3 mL (has no administration in time range)  propofol (DIPRIVAN) 1000 MG/100ML infusion (50 mcg/kg/min  86.2 kg Intravenous Rate/Dose Change 07/27/20 2031)  prothrombin complex conc human (KCENTRA) IVPB 1,594 Units (0 Units Intravenous Stopped 07/27/20 1944)  phytonadione (VITAMIN K) 10 mg in dextrose 5 % 50 mL IVPB (0 mg Intravenous Stopped 07/27/20 1953)  etomidate (AMIDATE) injection (20 mg Intravenous Given 07/27/20 1849)  succinylcholine (ANECTINE) injection (120 mg Intravenous Given 07/27/20 1850)  propofol (DIPRIVAN) 1000 MG/100ML infusion (2,790 mcg Intravenous New Bag/Given 07/27/20 1904)    ED Course  I have reviewed the triage vital signs and the nursing notes.  Pertinent labs & imaging results that were available during my care of the patient were reviewed by me and considered in my medical decision making (see chart for details).    MDM Rules/Calculators/A&P                          MDM  MSE complete  [name] [name] was evaluated in Emergency Department on 07/27/2020 for the symptoms described in the history of present illness. He was evaluated in the context of the global COVID-19 pandemic, which necessitated consideration that the patient might be at risk for infection with the SARS-CoV-2 virus that causes COVID-19. Institutional protocols and algorithms that pertain to the evaluation of patients at risk for COVID-19 are in a state of rapid change based on information released by regulatory bodies including the CDC and federal and state organizations. These policies and algorithms were followed during the patient's care in the ED.  Patient is presenting for evaluation of sudden decline in mental status.  Patient is on Coumadin.  Patient with recent head injury 24  hours prior.  Patient seen on arrival by neuro team.  CT imaging reveals evidence of intracranial hemorrhage.  Patient was intubated for airway protection immediately after CT.  Dr. Ronnald Ramp of neurosurgery made aware of case immediately after CT.  Administration of vitamin K and Kcentra initiated immediately after intubation.  Family is aware of critical nature of patient's pathology.     Final Clinical Impression(s) / ED Diagnoses Final diagnoses:  Subdural hematoma [facility])    Rx / DC Orders ED Discharge Orders    None       Valarie Merino, MD 07/27/20 2054

## 2020-07-27 NOTE — Consult Note (Signed)
Neurology Consultation Reason for Consult: Aphasia Referring Physician: Demaris Callander  CC: Aphasia  History is obtained from:Chart review  HPI: [name] [name] is a 80 y.o. male who was in his normal state of health when he laid down at 4 PM per EMS.  On awakening, he was found to not be speaking properly and EMS was called.  Over the course of their evaluation, his mental status has been progressively worsening and he appears to have some right-sided difficulty.  Code stroke was activated by EMS and he was taken emergently for a CT scan on arrival which demonstrates a large subdural hematoma.  Of note, he fell yesterday, and had a head CT which was negative.  LKW: 4 PM   ROS:  Unable to obtain due to altered mental status.   Past Medical History:  Diagnosis Date  . Acute rheumatic heart disease, unspecified    childhood, age  89 & 75  . Acute rheumatic pericarditis   . Atrial fibrillation ([place])    history  . CHF (congestive heart failure) ([place])   . Diverticulosis   . Dysrhythmia   . 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 External hemorrhoids without mention of complication   . H/O aortic valve replacement   . H/O mitral valve replacement   . 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 Lesion of ulnar nerve   . 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 Other and unspecified hyperlipidemia   . Pre-diabetes   . Previous back surgery 1978, jan 2007  . Psychosexual dysfunction with inhibited sexual excitement   . SOB (shortness of breath)    "with heavy exercise"  . Stroke ([place]) 08/2013   "I WAS IN AFIB AND THREW A CLOT, THE EFFECTS WERE TRANSITORY AND DIDNT LAST BUT FOR 30 MINUTES"   . Thoracic aortic aneurysm ([facility])      Family History  Problem Relation Age of Onset  . Macular degeneration Mother        macular degeneration  . Cancer Father        intestinal/GI  . Diabetes Paternal Aunt   . Heart attack Maternal Grandfather   . Diabetes Brother      Social History:   reports that he has never smoked. He has never used smokeless tobacco. He reports that he does not drink alcohol and does not use drugs.   Exam: Current vital signs: There were no vitals taken for this visit. Vital signs in last 24 hours:     Physical Exam  Constitutional: Appears well-developed and well-nourished.  Psych: Does not speak Eyes: No scleral injection HENT: No OP obstruction MSK: no joint deformities.  Cardiovascular: Normal rate and regular rhythm.  Respiratory: Effort normal, non-labored breathing GI: Soft.  No distension. There is no tenderness.  Skin: WDI  Neuro: Mental Status: Patient is obtunded, opens eyes minimally, but quickly closes them again despite continued noxious stimulation Cranial Nerves: II: Does not blink to threat pupils are equal, round, and reactive to light.   III,IV, VI: He does look both to the right and left, but seems more light roving eye movements then intentional gaze V,VII: Facial movement with decreased nasolabial fold on the right, corneals are intact Motor: He has increased tone in his right upper extremity with flexion to noxious stimulation, withdrawal versus flexion in the right lower extremity, moves the left side spontaneously  .  sensory: He does respond to noxious stimulation in all four  extremities Deep Tendon Cerebellar: Does not perform     I have reviewed labs in epic and the results pertinent to this consultation are: INR 3.4  I have reviewed the images obtained: CT head-large subdural hematoma  Impression: 80 year old male with likely delayed subdural hematoma following a fall on anticoagulation.  Neurosurgery will need to be consulted, and his anticoagulation will need to be reversed.  Recommendations: 1) anticoagulation reversal 2) neurosurgical consultation 3) neurology will be available on an as-needed basis.   Roland Rack, MD [facility] [phone]  If 7pm- 7am, please page  neurology on call as listed in [place].

## 2020-07-27 NOTE — ED Triage Notes (Signed)
Pt BIB [facility] for fall yesterday with clear scans, but today he took a nap and woke up this afternoon altered. Code stroke was called at 1814. Pt is on Coumadin.

## 2020-07-27 NOTE — H&P (Addendum)
[name] [name] is an 80 y.o. male.   HPI:  80 year old male presented to the ED tonight with altered mental status. He was just seen yesterday after sustaining a fall at church and hitting his head. He lost consciousness briefly as reported by his wife. Wife brought him to the ED yesterday where he had a CT head done which was benign. He was then sent home. He is on coumadin for a previous stroke. He also has an AVR and MVR. Today he laid down for a nap this afternoon because his neck was hurting. When he woke up his wife states that he had garbled speech and difficulty moving. She called their son, who is an EMS, who came within 8 minutes. EMS was called and the patient decompensated quickly. He was intubated in the ED. 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   Past Medical History:  Diagnosis Date  . Acute rheumatic heart disease, unspecified    childhood, age  35 & 8  . Acute rheumatic pericarditis   . Atrial fibrillation ([place])    history  . CHF (congestive heart failure) ([place])   . Diverticulosis   . Dysrhythmia   . 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 External hemorrhoids without mention of complication   . H/O aortic valve replacement   . H/O mitral valve replacement   . 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 Lesion of ulnar nerve   . 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 Other and unspecified hyperlipidemia   . Pre-diabetes   . Previous back surgery 1978, jan 2007  . Psychosexual dysfunction with inhibited sexual excitement   . SOB (shortness of breath)    "with heavy exercise"  . Stroke ([place]) 08/2013   "I WAS IN AFIB AND THREW A CLOT, THE EFFECTS WERE TRANSITORY AND DIDNT LAST BUT FOR 30 MINUTES"   . Thoracic aortic aneurysm [facility]     Past Surgical History:  Procedure Laterality Date  . AORTIC AND MITRAL VALVE REPLACEMENT     09/2004  . CARDIOVERSION     3 times from 2004-2006  .  CARDIOVERSION N/A 09/26/2013   Procedure: CARDIOVERSION;  Surgeon: Larey Dresser, MD;  Location: [place];  Service: Cardiovascular;  Laterality: N/A;  . CARDIOVERSION N/A 06/19/2014   Procedure: CARDIOVERSION;  Surgeon: Jerline Pain, MD;  Location: [place];  Service: Cardiovascular;  Laterality: N/A;  . CARDIOVERSION N/A 04/24/2017   Procedure: CARDIOVERSION;  Surgeon: Larey Dresser, MD;  Location: [facility] ENDOSCOPY;  Service: Cardiovascular;  Laterality: N/A;  . CARDIOVERSION N/A 06/08/2017   Procedure: CARDIOVERSION;  Surgeon: Pixie Casino, MD;  Location: [facility] ENDOSCOPY;  Service: Cardiovascular;  Laterality: N/A;  . CARDIOVERSION N/A 08/24/2017   Procedure: CARDIOVERSION;  Surgeon: Skeet Latch, MD;  Location: [name] ENDOSCOPY;  Service: Cardiovascular;  Laterality: N/A;  . CARDIOVERSION N/A 02/14/2018   Procedure: CARDIOVERSION;  Surgeon: Larey Dresser, MD;  Location: [facility] ENDOSCOPY;  Service: Cardiovascular;  Laterality: N/A;  . COLONOSCOPY    . laminectomies     10/1975 and in 03/2005  . TONSILLECTOMY     1950  . TOTAL HIP ARTHROPLASTY Right 04/03/2018   Procedure: TOTAL HIP ARTHROPLASTY ANTERIOR APPROACH;  Surgeon: Paralee Cancel, MD;  Location: [facility] ORS;  Service: Orthopedics;  Laterality: Right;  70 mins    Allergies  Allergen  Reactions  . Naproxen Hives  . No [employer] Food Allergies Other (See Comments)    Scallops - distress, nausea and vomitting    Social History   Tobacco Use  . Smoking status: Never Smoker  . Smokeless tobacco: Never Used  Substance Use Topics  . Alcohol use: Never    Comment: wine occasionally    Family History  Problem Relation Age of Onset  . Macular degeneration Mother        macular degeneration  . Cancer Father        intestinal/GI  . Diabetes Paternal Aunt   . Heart attack Maternal Grandfather   . Diabetes Brother      Review of Systems  Positive ROS: patient comatose  All other systems have been reviewed and were  otherwise negative with the exception of those mentioned in the HPI and as above.  Objective: Vital signs in last 24 hours: Temp:  [98.1 F (36.7 C)] 98.1 F (36.7 C) (05/23 1930) Pulse Rate:  [66-67] 67 (05/23 2000) Resp:  [22-23] 22 (05/23 2000) BP: (146-156)/(80-92) 156/92 (05/23 2000) SpO2:  [98 %-100 %] 98 % (05/23 2000) FiO2 (%):  [40 %-100 %] 40 % (05/23 1958) Weight:  [93 kg] 93 kg (05/23 1903)  General Appearance: obtunded Head: Normocephalic, without obvious abnormality, atraumatic Eyes: Right: 3 nonreactive, Left: 2 nonreactive, no corneal reflex    Throat: ETT Lungs: respirations unlabored, ventilated Heart: Regular rate and rhythm Extremities: Extremities normal, atraumatic, no cyanosis or edema Pulses: 2+ and symmetric all extremities Skin: Skin color, texture, turgor normal, no rashes or lesions  NEUROLOGIC:   Mental status: obtunded Motor Exam - decerebrate posturing  Sensory Exam - none Reflexes: no reflexes Coordination - unable to test Gait - unable to test Balance - unable to test Cranial Nerves: I: smell Not tested  II: visual acuity  OS: na    OD: na  II: visual fields UTA  II: pupils R: round, 3, nonreactive, L: round, 2, nonreactive  III,VII: ptosis UTA  III,IV,VI: extraocular muscles  UTA  V: mastication UTA  V: facial light touch sensation  UTA  V,VII: corneal reflex  No corneal reflex  VII: facial muscle function - upper  UTA  VII: facial muscle function - lower UTA  VIII: hearing UTA  IX: soft palate elevation  UTA  IX,X: gag reflex No gag reflex  XI: trapezius strength  UTA  XI: sternocleidomastoid strength UTA  XI: neck flexion strength  UTA  XII: tongue strength  UTA    Data Review Lab Results  Component Value Date   WBC 14.7 (H) 07/27/2020   HGB 11.6 (L) 07/27/2020   HCT 34.0 (L) 07/27/2020   MCV 94.1 07/27/2020   PLT 216 07/27/2020   Lab Results  Component Value Date   NA 140 07/27/2020   K 3.8 07/27/2020   CL 107  07/27/2020   CO2 23 07/27/2020   BUN 17 07/27/2020   CREATININE 0.90 07/27/2020   GLUCOSE 138 (H) 07/27/2020   Lab Results  Component Value Date   INR 3.4 (H) 07/27/2020    Radiology: DG Chest Portable 1 View  Result Date: 07/27/2020 CLINICAL DATA:  80 year old male status post intubation. EXAM: PORTABLE CHEST 1 VIEW COMPARISON:  Chest radiograph dated 07/26/2020. FINDINGS: Endotracheal tube with tip approximately 4.5 cm above the carina. Enteric tube extends below the diaphragm with tip beyond the inferior margin of the image. Left lung base atelectasis versus less likely infiltrate. There is no pleural effusion  pneumothorax. Stable cardiomegaly. 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 A battery pack noted over the left chest. Atherosclerotic calcification of the aorta. No acute osseous pathology. IMPRESSION: 1. Endotracheal tube above the carina. 2. Left lung base atelectasis versus less likely infiltrate. Electronically Signed   By: Anner Crete M.D.   On: 07/27/2020 20:31   CT HEAD CODE STROKE WO CONTRAST  Result Date: 07/27/2020 CLINICAL DATA:  Code stroke. EXAM: CT HEAD WITHOUT CONTRAST TECHNIQUE: Contiguous axial images were obtained from the base of the skull through the vertex without intravenous contrast. COMPARISON:  07/26/2020 FINDINGS: Brain: Acute, mixed density right cerebral convexity subdural hematoma measuring up to 2.2 cm in thickness. Areas of low density likely due to presence of anticoagulation. Additional acute subdural hematoma underlying the posterior left cerebral convexity measuring up to 1.1 cm in thickness. Small volume subdural hemorrhage is present along the right aspect of the falx and tentorium. There is mass effect with regional sulcal effacement. Subfalcine herniation is present with leftward midline shift measuring up to 1.8 cm. There is early trapping of the left lateral ventricle. Partial effacement of the suprasellar cistern. No acute  appearing loss of gray-white differentiation. Chronic left parietotemporal infarct with expected dilatation of the posterior left lateral ventricle. Vascular: Atherosclerotic calcification at the skull base. Skull: Calvarium is intact. Sinuses/Orbits: Right maxillary sinus mucosal thickening. Other: Mastoid air cells are clear. Left posterior scalp soft tissue swelling. IMPRESSION: Acute right much larger than left subdural hematomas. Additional small volume subdural hemorrhage along the falx and tentorium. Mass effect including subfalcine herniation with leftward midline shift of 1.8 cm and early trapping of the left lateral ventricle. These results were communicated to Dr. Leonel Ramsay at 6:46 pm on 07/27/2020 by text page via the [facility] messaging system. Electronically Signed   By: Macy Mis M.D.   On: 07/27/2020 18:54     Assessment/Plan: 80 year old male presented to the ED tonight with altered mental status after sustaining a fall yesterday. CT head showed a very large right sided acute SDH and a small left sided SDH with a large midline shift measuring 1.8cm and mass effect. When called about the patient we immediately called the OR to have them book the case. However, upon arrival to the ED and further examination it was very clear that the patient had decompensated quickly. He did get [place] in the ED. The patient had no cough, gag, corneal reflexes, pupils unequal, and decerebrate posturing. We had a very long conversation with the wife and two sons. We explained in detail the prognosis even if we took him to the OR for evacuation of the hematoma. I do  think that at this point with the extent of brain damage he has had based on his physical exam, that his prognosis is very poor. He would likely end up with a trach/PEG and in a nursing home with no quality of life even if we evacuated the SDH. We discussed this with his family who understood and has agreed that the patient would never want to live  like this. They do not want to move forward with any intervention and would like to make him comfort care when the other son gets in town from [place].     Ocie Cornfield [facility] 07/27/2020 8:38 PM

## 2020-07-28 ENCOUNTER — Inpatient Hospital Stay (HOSPITAL_COMMUNITY): Payer: Medicare Other | Admitting: Anesthesiology

## 2020-07-28 ENCOUNTER — Ambulatory Visit: Payer: BLUE CROSS/BLUE SHIELD | Admitting: Internal Medicine

## 2020-07-28 ENCOUNTER — Inpatient Hospital Stay (HOSPITAL_COMMUNITY): Payer: Medicare Other

## 2020-07-28 ENCOUNTER — Encounter (HOSPITAL_COMMUNITY)
Admission: EM | Disposition: A | Discharge status: Rehabilitation Facility | Payer: Self-pay | Source: Home / Self Care | Attending: Neurological Surgery

## 2020-07-28 ENCOUNTER — Encounter (HOSPITAL_COMMUNITY): Payer: Self-pay | Admitting: Neurological Surgery

## 2020-07-28 DIAGNOSIS — S065X9A Traumatic subdural hemorrhage with loss of consciousness of unspecified duration, initial encounter: Secondary | ICD-10-CM | POA: Diagnosis not present

## 2020-07-28 DIAGNOSIS — Z9889 Other specified postprocedural states: Secondary | ICD-10-CM

## 2020-07-28 HISTORY — PX: CRANIOTOMY: SHX93

## 2020-07-28 LAB — PROTIME-INR
INR: 1.4 — ABNORMAL HIGH (ref 0.8–1.2)
Prothrombin Time: 17.5 seconds — ABNORMAL HIGH (ref 11.4–15.2)

## 2020-07-28 LAB — BASIC METABOLIC PANEL
Anion gap: 5 (ref 5–15)
BUN: 14 mg/dL (ref 8–23)
CO2: 24 mmol/L (ref 22–32)
Calcium: 8.2 mg/dL — ABNORMAL LOW (ref 8.9–10.3)
Chloride: 111 mmol/L (ref 98–111)
Creatinine, Ser: 0.88 mg/dL (ref 0.61–1.24)
GFR, Estimated: >60 mL/min (ref >60)
Glucose, Bld: 141 mg/dL — ABNORMAL HIGH (ref 70–99)
Potassium: 3.8 mmol/L (ref 3.5–5.1)
Sodium: 140 mmol/L (ref 135–145)

## 2020-07-28 LAB — MRSA PCR SCREENING: MRSA by PCR: NEGATIVE

## 2020-07-28 LAB — PREPARE RBC (CROSSMATCH)

## 2020-07-28 LAB — TRIGLYCERIDES: Triglycerides: 70 mg/dL (ref <150)

## 2020-07-28 LAB — MAGNESIUM: Magnesium: 2.1 mg/dL (ref 1.7–2.4)

## 2020-07-28 SURGERY — CRANIOTOMY HEMATOMA EVACUATION SUBDURAL
Anesthesia: General | Site: Head

## 2020-07-28 MED ORDER — LABETALOL HCL 5 MG/ML IV SOLN
10.0000 mg | INTRAVENOUS | Status: DC | PRN
  Administered 2020-07-29: 20 mg via INTRAVENOUS
  Administered 2020-07-29: 10 mg via INTRAVENOUS
  Filled 2020-07-28: qty 4
  Filled 2020-07-28: qty 8

## 2020-07-28 MED ORDER — FUROSEMIDE 20 MG PO TABS: 20.0000 mg | ORAL_TABLET | Freq: Every day | ORAL | Status: DC

## 2020-07-28 MED ORDER — DEXAMETHASONE SODIUM PHOSPHATE 10 MG/ML IJ SOLN
6.0000 mg | Freq: Four times a day (QID) | INTRAMUSCULAR | Status: AC
  Administered 2020-07-28 – 2020-07-29 (×4): 6 mg via INTRAVENOUS
  Filled 2020-07-28 (×4): qty 1

## 2020-07-28 MED ORDER — LIDOCAINE-EPINEPHRINE 1 %-1:100000 IJ SOLN
INTRAMUSCULAR | Status: DC | PRN
  Administered 2020-07-28: 10 mL

## 2020-07-28 MED ORDER — DEXAMETHASONE SODIUM PHOSPHATE 4 MG/ML IJ SOLN
4.0000 mg | Freq: Three times a day (TID) | INTRAMUSCULAR | Status: DC
  Administered 2020-07-30 – 2020-07-31 (×3): 4 mg via INTRAVENOUS
  Filled 2020-07-28 (×3): qty 1

## 2020-07-28 MED ORDER — POTASSIUM CHLORIDE IN NACL 20-0.9 MEQ/L-% IV SOLN
INTRAVENOUS | Status: DC
  Filled 2020-07-28: qty 1000

## 2020-07-28 MED ORDER — ONDANSETRON HCL 4 MG PO TABS: 4.0000 mg | ORAL_TABLET | ORAL | Status: DC | PRN

## 2020-07-28 MED ORDER — LEVETIRACETAM IN NACL 500 MG/100ML IV SOLN
500.0000 mg | Freq: Two times a day (BID) | INTRAVENOUS | Status: DC
  Administered 2020-07-28 – 2020-07-29 (×3): 500 mg via INTRAVENOUS
  Filled 2020-07-28 (×3): qty 100

## 2020-07-28 MED ORDER — POTASSIUM CHLORIDE 20 MEQ PO PACK
40.0000 meq | PACK | Freq: Once | ORAL | Status: AC
  Administered 2020-07-28: 40 meq
  Filled 2020-07-28: qty 2

## 2020-07-28 MED ORDER — THROMBIN 5000 UNITS EX SOLR: OROMUCOSAL | Status: DC | PRN

## 2020-07-28 MED ORDER — THROMBIN 20000 UNITS EX SOLR
CUTANEOUS | Status: AC
  Filled 2020-07-28: qty 20000

## 2020-07-28 MED ORDER — SENNA 8.6 MG PO TABS
1.0000 | ORAL_TABLET | Freq: Two times a day (BID) | ORAL | Status: DC
  Filled 2020-07-28 (×2): qty 1

## 2020-07-28 MED ORDER — THROMBIN 5000 UNITS EX SOLR
CUTANEOUS | Status: AC
  Filled 2020-07-28: qty 5000

## 2020-07-28 MED ORDER — PROMETHAZINE HCL 25 MG PO TABS: 12.5000 mg | ORAL_TABLET | ORAL | Status: DC | PRN

## 2020-07-28 MED ORDER — FINASTERIDE 5 MG PO TABS: 5.0000 mg | ORAL_TABLET | Freq: Every day | ORAL | Status: DC

## 2020-07-28 MED ORDER — LIDOCAINE-EPINEPHRINE 1 %-1:100000 IJ SOLN
INTRAMUSCULAR | Status: AC
  Filled 2020-07-28: qty 1

## 2020-07-28 MED ORDER — CHLORHEXIDINE GLUCONATE CLOTH 2 % EX PADS
6.0000 | MEDICATED_PAD | Freq: Every day | CUTANEOUS | Status: DC
  Administered 2020-07-28 – 2020-07-31 (×4): 6 via TOPICAL

## 2020-07-28 MED ORDER — HYDROCODONE-ACETAMINOPHEN 5-325 MG PO TABS: 1.0000 | ORAL_TABLET | ORAL | Status: DC | PRN

## 2020-07-28 MED ORDER — METOPROLOL SUCCINATE ER 50 MG PO TB24: 50.0000 mg | ORAL_TABLET | Freq: Two times a day (BID) | ORAL | Status: DC

## 2020-07-28 MED ORDER — 0.9 % SODIUM CHLORIDE (POUR BTL) OPTIME
TOPICAL | Status: DC | PRN
  Administered 2020-07-28: 3000 mL

## 2020-07-28 MED ORDER — LOSARTAN POTASSIUM 50 MG PO TABS
25.0000 mg | ORAL_TABLET | Freq: Every day | ORAL | Status: DC
  Administered 2020-07-29: 25 mg
  Filled 2020-07-28 (×2): qty 1

## 2020-07-28 MED ORDER — LEVETIRACETAM IN NACL 500 MG/100ML IV SOLN
500.0000 mg | Freq: Two times a day (BID) | INTRAVENOUS | Status: DC
  Administered 2020-07-28 (×2): 500 mg via INTRAVENOUS
  Filled 2020-07-28 (×2): qty 100

## 2020-07-28 MED ORDER — PROPOFOL 10 MG/ML IV BOLUS
INTRAVENOUS | Status: DC | PRN
  Administered 2020-07-28: 50 mg via INTRAVENOUS

## 2020-07-28 MED ORDER — FUROSEMIDE 20 MG PO TABS
20.0000 mg | ORAL_TABLET | Freq: Every day | ORAL | Status: DC
  Administered 2020-07-29: 20 mg
  Filled 2020-07-28 (×2): qty 1

## 2020-07-28 MED ORDER — PROPOFOL 1000 MG/100ML IV EMUL
5.0000 ug/kg/min | INTRAVENOUS | Status: DC
  Administered 2020-07-29: 15 ug/kg/min via INTRAVENOUS
  Filled 2020-07-28: qty 100

## 2020-07-28 MED ORDER — SODIUM CHLORIDE 0.9% IV SOLUTION: Freq: Once | INTRAVENOUS | Status: DC

## 2020-07-28 MED ORDER — CEFAZOLIN SODIUM-DEXTROSE 1-4 GM/50ML-% IV SOLN
1.0000 g | Freq: Three times a day (TID) | INTRAVENOUS | Status: AC
  Administered 2020-07-28 – 2020-07-29 (×2): 1 g via INTRAVENOUS
  Filled 2020-07-28 (×2): qty 50

## 2020-07-28 MED ORDER — CEFAZOLIN SODIUM-DEXTROSE 2-3 GM-%(50ML) IV SOLR
INTRAVENOUS | Status: DC | PRN
  Administered 2020-07-28: 2 g via INTRAVENOUS

## 2020-07-28 MED ORDER — PROPOFOL 10 MG/ML IV BOLUS
INTRAVENOUS | Status: AC
  Filled 2020-07-28: qty 20

## 2020-07-28 MED ORDER — KETOROLAC TROMETHAMINE 30 MG/ML IJ SOLN
INTRAMUSCULAR | Status: AC
  Filled 2020-07-28: qty 1

## 2020-07-28 MED ORDER — BACITRACIN ZINC 500 UNIT/GM EX OINT
TOPICAL_OINTMENT | CUTANEOUS | Status: AC
  Filled 2020-07-28: qty 28.35

## 2020-07-28 MED ORDER — PROPOFOL 1000 MG/100ML IV EMUL
INTRAVENOUS | Status: AC
  Filled 2020-07-28: qty 100

## 2020-07-28 MED ORDER — PHENYLEPHRINE HCL-NACL 10-0.9 MG/250ML-% IV SOLN
INTRAVENOUS | Status: DC | PRN
  Administered 2020-07-28: 40 ug/min via INTRAVENOUS

## 2020-07-28 MED ORDER — FENTANYL CITRATE (PF) 250 MCG/5ML IJ SOLN
INTRAMUSCULAR | Status: AC
  Filled 2020-07-28: qty 5

## 2020-07-28 MED ORDER — MORPHINE SULFATE (PF) 2 MG/ML IV SOLN
1.0000 mg | INTRAVENOUS | Status: DC | PRN
  Administered 2020-07-28: 2 mg via INTRAVENOUS
  Filled 2020-07-28: qty 1

## 2020-07-28 MED ORDER — POTASSIUM CHLORIDE IN NACL 20-0.9 MEQ/L-% IV SOLN
INTRAVENOUS | Status: DC
  Filled 2020-07-28 (×5): qty 1000

## 2020-07-28 MED ORDER — CEFAZOLIN SODIUM 1 G IJ SOLR
INTRAMUSCULAR | Status: AC
  Filled 2020-07-28: qty 20

## 2020-07-28 MED ORDER — PANTOPRAZOLE SODIUM 40 MG IV SOLR
40.0000 mg | INTRAVENOUS | Status: DC
  Administered 2020-07-28: 40 mg via INTRAVENOUS
  Filled 2020-07-28: qty 40

## 2020-07-28 MED ORDER — ROCURONIUM BROMIDE 10 MG/ML (PF) SYRINGE
PREFILLED_SYRINGE | INTRAVENOUS | Status: DC | PRN
  Administered 2020-07-28: 80 mg via INTRAVENOUS
  Administered 2020-07-28: 20 mg via INTRAVENOUS

## 2020-07-28 MED ORDER — THROMBIN 20000 UNITS EX SOLR: CUTANEOUS | Status: DC | PRN

## 2020-07-28 MED ORDER — METOPROLOL TARTRATE 50 MG PO TABS
50.0000 mg | ORAL_TABLET | Freq: Two times a day (BID) | ORAL | Status: DC
  Administered 2020-07-28 – 2020-07-29 (×2): 50 mg
  Filled 2020-07-28 (×3): qty 1

## 2020-07-28 MED ORDER — FENTANYL CITRATE (PF) 250 MCG/5ML IJ SOLN
INTRAMUSCULAR | Status: DC | PRN
  Administered 2020-07-28 (×2): 100 ug via INTRAVENOUS
  Administered 2020-07-28: 50 ug via INTRAVENOUS

## 2020-07-28 MED ORDER — ACETAMINOPHEN 650 MG RE SUPP: 650.0000 mg | RECTAL | Status: DC | PRN

## 2020-07-28 MED ORDER — ONDANSETRON HCL 4 MG/2ML IJ SOLN: 4.0000 mg | INTRAMUSCULAR | Status: DC | PRN

## 2020-07-28 MED ORDER — DEXAMETHASONE SODIUM PHOSPHATE 4 MG/ML IJ SOLN
4.0000 mg | Freq: Four times a day (QID) | INTRAMUSCULAR | Status: AC
  Administered 2020-07-29 – 2020-07-30 (×4): 4 mg via INTRAVENOUS
  Filled 2020-07-28 (×4): qty 1

## 2020-07-28 MED ORDER — PANTOPRAZOLE SODIUM 40 MG IV SOLR
40.0000 mg | Freq: Every day | INTRAVENOUS | Status: DC
  Administered 2020-07-29: 40 mg via INTRAVENOUS
  Filled 2020-07-28: qty 40

## 2020-07-28 MED ORDER — EPHEDRINE 5 MG/ML INJ
INTRAVENOUS | Status: AC
  Filled 2020-07-28: qty 10

## 2020-07-28 MED ORDER — CHLORHEXIDINE GLUCONATE 0.12% ORAL RINSE (MEDLINE KIT)
15.0000 mL | Freq: Two times a day (BID) | OROMUCOSAL | Status: DC
  Administered 2020-07-28 (×2): 15 mL via OROMUCOSAL

## 2020-07-28 MED ORDER — DOFETILIDE 500 MCG PO CAPS
500.0000 ug | ORAL_CAPSULE | Freq: Two times a day (BID) | ORAL | Status: DC
  Administered 2020-07-28 – 2020-07-29 (×2): 500 ug
  Filled 2020-07-28 (×5): qty 1

## 2020-07-28 MED ORDER — ACETAMINOPHEN 325 MG PO TABS
650.0000 mg | ORAL_TABLET | ORAL | Status: DC | PRN
  Filled 2020-07-28: qty 2

## 2020-07-28 MED ORDER — ARTIFICIAL TEARS OPHTHALMIC OINT
TOPICAL_OINTMENT | OPHTHALMIC | Status: AC
  Filled 2020-07-28: qty 3.5

## 2020-07-28 MED ORDER — PHENYLEPHRINE 40 MCG/ML (10ML) SYRINGE FOR IV PUSH (FOR BLOOD PRESSURE SUPPORT)
PREFILLED_SYRINGE | INTRAVENOUS | Status: AC
  Filled 2020-07-28: qty 10

## 2020-07-28 MED ORDER — SODIUM CHLORIDE 0.9 % IV SOLN: INTRAVENOUS | Status: DC | PRN

## 2020-07-28 MED ORDER — ROCURONIUM BROMIDE 10 MG/ML (PF) SYRINGE
PREFILLED_SYRINGE | INTRAVENOUS | Status: AC
  Filled 2020-07-28: qty 20

## 2020-07-28 MED ORDER — LOSARTAN POTASSIUM 25 MG PO TABS: 25.0000 mg | ORAL_TABLET | Freq: Every day | ORAL | Status: DC

## 2020-07-28 MED ORDER — CHLORHEXIDINE GLUCONATE CLOTH 2 % EX PADS
6.0000 | MEDICATED_PAD | Freq: Every day | CUTANEOUS | Status: DC
  Administered 2020-07-28: 6 via TOPICAL

## 2020-07-28 MED ORDER — ORAL CARE MOUTH RINSE
15.0000 mL | OROMUCOSAL | Status: DC
  Administered 2020-07-28 (×6): 15 mL via OROMUCOSAL

## 2020-07-28 MED ORDER — TAMSULOSIN HCL 0.4 MG PO CAPS: 0.8000 mg | ORAL_CAPSULE | Freq: Two times a day (BID) | ORAL | Status: DC

## 2020-07-28 SURGICAL SUPPLY — 42 items
BUR SPIRAL ROUTER 2.3 (BUR) ×2 IMPLANT
CANISTER SUCT 3000ML PPV (MISCELLANEOUS) ×2 IMPLANT
DRAIN JP 7F FLT 3/4 PRF SI HBL (DRAIN) ×1 IMPLANT
DRAPE NEUROLOGICAL W/INCISE (DRAPES) ×2 IMPLANT
DRAPE WARM FLUID 44X44 (DRAPES) ×2 IMPLANT
DURAPREP 6ML APPLICATOR 50/CS (WOUND CARE) ×2 IMPLANT
ELECT REM PT RETURN 9FT ADLT (ELECTROSURGICAL) ×2
ELECTRODE REM PT RTRN 9FT ADLT (ELECTROSURGICAL) ×1 IMPLANT
EVACUATOR 1/8 PVC DRAIN (DRAIN) ×1 IMPLANT
EVACUATOR SILICONE 100CC (DRAIN) ×1 IMPLANT
GAUZE SPONGE 4X4 12PLY STRL (GAUZE/BANDAGES/DRESSINGS) ×2 IMPLANT
GLOVE BIO SURGEON STRL SZ7 (GLOVE) ×2 IMPLANT
GLOVE BIO SURGEON STRL SZ8 (GLOVE) ×3 IMPLANT
GLOVE SURG UNDER POLY LF SZ7 (GLOVE) ×2 IMPLANT
GOWN STRL REUS W/ TWL LRG LVL3 (GOWN DISPOSABLE) IMPLANT
GOWN STRL REUS W/ TWL XL LVL3 (GOWN DISPOSABLE) IMPLANT
GOWN STRL REUS W/TWL 2XL LVL3 (GOWN DISPOSABLE) IMPLANT
GOWN STRL REUS W/TWL LRG LVL3 (GOWN DISPOSABLE) ×4
GOWN STRL REUS W/TWL XL LVL3 (GOWN DISPOSABLE) ×4
HEMOSTAT POWDER KIT SURGIFOAM (HEMOSTASIS) ×1 IMPLANT
KIT BASIN OR (CUSTOM PROCEDURE TRAY) ×2 IMPLANT
KIT TURNOVER KIT B (KITS) ×2 IMPLANT
NEEDLE HYPO 22GX1.5 SAFETY (NEEDLE) ×2 IMPLANT
NS IRRIG 1000ML POUR BTL (IV SOLUTION) ×2 IMPLANT
PACK CRANIOTOMY CUSTOM (CUSTOM PROCEDURE TRAY) ×2 IMPLANT
PATTIES SURGICAL 1X1 (DISPOSABLE) IMPLANT
PERFORATOR LRG  14-11MM (BIT) ×2
PERFORATOR LRG 14-11MM (BIT) ×1 IMPLANT
PIN MAYFIELD SKULL DISP (PIN) ×1 IMPLANT
PLATE CRANIAL 12 2H RIGID UNI (Plate) ×4 IMPLANT
SCREW UNIII AXS SD 1.5X4 (Screw) ×8 IMPLANT
SPONGE NEURO XRAY DETECT 1X3 (DISPOSABLE) IMPLANT
SPONGE SURGIFOAM ABS GEL 100 (HEMOSTASIS) ×2 IMPLANT
STAPLER VISISTAT 35W (STAPLE) ×2 IMPLANT
SUT NURALON 4 0 TR CR/8 (SUTURE) ×3 IMPLANT
SUT VIC AB 2-0 CP2 18 (SUTURE) ×2 IMPLANT
TAPE CLOTH SURG 4X10 WHT LF (GAUZE/BANDAGES/DRESSINGS) ×1 IMPLANT
TOWEL GREEN STERILE (TOWEL DISPOSABLE) ×2 IMPLANT
TOWEL GREEN STERILE FF (TOWEL DISPOSABLE) ×2 IMPLANT
TRAY FOLEY MTR SLVR 16FR STAT (SET/KITS/TRAYS/PACK) ×1 IMPLANT
UNDERPAD 30X36 HEAVY ABSORB (UNDERPADS AND DIAPERS) IMPLANT
WATER STERILE IRR 1000ML POUR (IV SOLUTION) ×2 IMPLANT

## 2020-07-28 NOTE — Progress Notes (Addendum)
Surgical consent form filled out per order. I will put the consent form at bedside for Dr. Ronnald Ramp to sign. I did not witness the conversation with family about the procedure so I will let the Dr sign the form. Patient intubated with no family at bedside at this time.

## 2020-07-28 NOTE — Progress Notes (Addendum)
NAME:  [name] [name], MRN:  [id number], DOB:  03/07/1941, LOS: 1 ADMISSION DATE:  07/27/2020, CONSULTATION DATE: 07/28/2020 REFERRING MD: Dr. Ronnald Ramp, CHIEF COMPLAINT: Bilateral subdural hematoma  History of Present Illness:  80 year old male with PMHx Afib (on Coumadin) with prior CVA, s/p AVR/MVR (bioprosthestic), CHF, TAA, who presented to the emergency room from home after a fall at church. CT Head in ED negative; however, patient went home to take a nap and woke up with garbled speech. Repeat CT Head demonstrated a large R subdural hematoma and smaller L subdural hematoma. Admitted to ICU and intubated for respiratory failure in the setting of AMS/inability to protect airway. NSGY initially evaluated the patient and felt even with surgical intervention, prognosis was extremely poor. Patient showed significant improvement in neurologic status 5/24AM with revamped plan for craniotomy/hematoma evacuation with NSGY.  PCCM consulted for assistance with ventilator management.  Pertinent Medical History:  Atrial fibrillation S/p AVR and MVR CHF Diverticulosis CVA Thoracic aortic aneurysm  Significant Hospital Events: Including procedures, antibiotic start and stop dates in addition to other pertinent events    5/23 - Presented to [facility] ED with garbled speech after a nap. Fell at church 5/23AM, on Coumadin for AF/prior CVA. CT Head demonstrated large R SDH, smaller L SDH. AMS with inability to protect airway, intubated. Per NSGY, poor prognosis and plan for comfort/Palliative consult.  5/24 - Significantly improved neurologic exam - opening eyes, following commands. Plan for crani/surgical evacuation per NSGY.  Objective   Blood pressure (!) 160/84, pulse 76, temperature (!) 100.4 F (38 C), temperature source Axillary, resp. rate 19, weight 93 kg, SpO2 100 %.    Vent Mode: PRVC FiO2 (%):  [40 %-100 %] 40 % Set Rate:  [18 bmp] 18 bmp Vt Set:  [55 mL-550 mL] 550 mL PEEP:  [5 cmH20] 5  cmH20 Plateau Pressure:  [11 cmH20-21 cmH20] 19 cmH20   Intake/Output Summary (Last 24 hours) at 07/28/2020 0834 Last data filed at 07/28/2020 0600 Gross per 24 hour  Intake 668.16 ml  Output 900 ml  Net -231.84 ml   Filed Weights   07/27/20 1903  Weight: 93 kg   Physical Examination: General: Acutely ill-appearing man in NAD. HEENT: [place]/AT, anicteric sclera, PERRL, moist mucous membranes. Neuro: Lethargic. Responds to verbal stimuli. Following commands consistently. Moves all 4 extremities spontaneously. CV: RRR, no m/g/r. PULM: Breathing even and unlabored on vent (PEEP 5, FiO2 40%). Lung fields CTAB. GI: Soft, nontender, nondistended. Normoactive bowel sounds. Extremities: Trace BLE edema noted. Skin: Warm/dry, no rashes.  Labs/imaging that I have personally reviewed: (right click and "Reselect all SmartList Selections" daily)  5/23 1800 WBC 14.7 (8.5), H&H 12.7/38.4 (13.7/42.1), Plt 215 (203)  Na 138, K 4.1, CO2 23, BUN 16, Cr 0.94 Ca 8.8  ABG 7.434/40.2/504/27  INR 3.4  5/24 0140 INR 1.4  CT Head 5/24 0826 - Persistent bilateral subdural hematomas, slightly smaller than prior exam; lesser degree of midline shift compared to prior. Small amount of IVH in L lateral ventricle. Atrophic changes and findings of prior L temporoparietal infarct.  Assessment & Plan:  Acute hypoxic respiratory failure in the setting of altered mental status - Continue full vent support (4-8cc/kg IBW) - Wean FiO2 for O2 sat > 90% - Daily WUA/SBT - VAP bundle - Pulmonary hygiene - PAD protocol for sedation: Propofol for goal RASS 0 to -1  Large right subdural hematoma Small left subdural hematoma - Management per NSGY - Continue Keppra for seizure prophylaxis - Plan for  craniotomy/hematoma evacuation today, 5/24 - Postsurgical care per NSGY  Coumadin-induced coagulopathy History of Afib S/p AVR and MVR (bioprosthetic) S/p Vitamin K, KCentra administration - INR normalized on 5/24AM  labs - Will need in-depth discussion of candidacy and risk vs. benefit, timing and choice of AC agent between Cards, NSGY  Best practice (right click and "Reselect all SmartList Selections" daily)   Per primary team  Critical care time: 35 minutes   Lestine Mount, PA-C [facility] 07/28/20 8:48 AM  Please see [email or web address] for pager details.  From 7A-7P if no response, please call [phone] After hours, please call ELink [phone]

## 2020-07-28 NOTE — Progress Notes (Signed)
Initial Nutrition Assessment  DOCUMENTATION CODES:  Not applicable  INTERVENTION:  Place Cortrak tomorrow if pt still intubated and start TF:  Initiate tube feeding via Cortrak: Vital 1.5 at 55 ml/h (1320 ml per day) Prosource TF 45 ml TID  Provides 2100 kcal, 122 gm protein, 1003 ml free water daily.  NUTRITION DIAGNOSIS:  Increased nutrient needs related to acute illness as evidenced by estimated needs.  GOAL:  Patient will meet greater than or equal to 90% of their needs  MONITOR:  TF tolerance,Diet advancement,Labs,Weight trends,I & O's  REASON FOR ASSESSMENT:  Ventilator    ASSESSMENT:  80 yo male with a PMH of A-fib, diverticulosis, enlarged aorta, prediabetes, and CHF who presents with subdural hematoma.  Patient is currently intubated on ventilator support MV: 9.8 L/min Temp (24hrs), Avg:99.1 F (37.3 C), Min:98 F (36.7 C), Max:100.4 F (38 C)  Propofol: not running  Listened to pt's rounds this morning. Given pt's radical improvement overnight, RN and team hopeful that pt will make a good recovery. Plan for craniotomy this afternoon to evacuate the hematoma.  RD to leave TF recommendations if pt does not extubate within the next 24 hrs.  Spoke with wife and son at bedside. Wife and son report that pt eats very well at home and minds his Vitamin K intake very carefully. They deny any noticeable weight loss recently as well.  Medications: reviewed; IVF with KCl 20 mEq, propofol  Labs: reviewed; CBG 125-160  NUTRITION - FOCUSED PHYSICAL EXAM: Flowsheet Row Most Recent Value  Orbital Region Mild depletion  Upper Arm Region No depletion  Thoracic and Lumbar Region No depletion  Buccal Region Unable to assess  Temple Region No depletion  Clavicle Bone Region No depletion  Clavicle and Acromion Bone Region No depletion  Scapular Bone Region Unable to assess  Dorsal Hand No depletion  Patellar Region No depletion  Anterior Thigh Region No depletion   Posterior Calf Region No depletion  Edema (RD Assessment) None  Hair Reviewed  Eyes Unable to assess  Mouth Unable to assess  Skin Reviewed  Nails Reviewed     Diet Order:   Diet Order            Diet NPO time specified  Diet effective now                EDUCATION NEEDS:  No education needs have been identified at this time  Skin:  Skin Assessment: Reviewed RN Assessment  Last BM:  PTA/unknown  Height:  Ht Readings from Last 1 Encounters:  07/26/20 5\' 7"  (1.702 m)   Weight:  Wt Readings from Last 1 Encounters:  07/27/20 93 kg   Ideal Body Weight:  67.3 kg  BMI:  Body mass index is 32.11 kg/m.  Estimated Nutritional Needs:  Kcal:  2000-2200 Protein:  110-125 grams Fluid:  >2 L  Derrel Nip, RD, LDN Registered Dietitian After Hours/Weekend Pager # in [place]

## 2020-07-28 NOTE — Progress Notes (Signed)
Pt transported to CT and back to 4N 19 on full vent support. No complications noted.

## 2020-07-28 NOTE — Progress Notes (Signed)
Spoke to patient's wife - Herbert Pun at bedside and discussed patient's improved condition. Explained to her that patient will be going to surgery this afternoon after 1500 per Dr. Ronnald Ramp.

## 2020-07-28 NOTE — Progress Notes (Signed)
Subjective: Patient intubated but not on any sedation now. Follows commands now and has completely changed his neurological assessment early this morning.   Objective: Vital signs in last 24 hours: Temp:  [98 F (36.7 C)-99.7 F (37.6 C)] 99.5 F (37.5 C) (05/24 0400) Pulse Rate:  [57-76] 76 (05/24 0748) Resp:  [0-23] 19 (05/24 0748) BP: (128-163)/(73-92) 160/84 (05/24 0700) SpO2:  [98 %-100 %] 99 % (05/24 0748) FiO2 (%):  [40 %-100 %] 40 % (05/24 0748) Weight:  [93 kg] 93 kg (05/23 1903)  Intake/Output from previous day: 05/23 0701 - 05/24 0700 In: 668.2 [I.V.:562.2; IV Piggyback:106] Out: 900 [Urine:900] Intake/Output this shift: No intake/output data recorded.  Neuro: MAE, FC, opens eyes to command.   Lab Results: Lab Results  Component Value Date   WBC 14.7 (H) 07/27/2020   HGB 11.6 (L) 07/27/2020   HCT 34.0 (L) 07/27/2020   MCV 94.1 07/27/2020   PLT 216 07/27/2020   Lab Results  Component Value Date   INR 1.4 (H) 07/28/2020   BMET Lab Results  Component Value Date   NA 140 07/27/2020   K 3.8 07/27/2020   CL 107 07/27/2020   CO2 23 07/27/2020   GLUCOSE 138 (H) 07/27/2020   BUN 17 07/27/2020   CREATININE 0.90 07/27/2020   CALCIUM 8.8 (L) 07/27/2020    Studies/Results: CT Head Wo Contrast  Result Date: 07/26/2020 CLINICAL DATA:  Fall, on blood thinners.  Posterior hematoma EXAM: CT HEAD WITHOUT CONTRAST TECHNIQUE: Contiguous axial images were obtained from the base of the skull through the vertex without intravenous contrast. COMPARISON:  08/05/2013 FINDINGS: Brain: Old infarct in the posterior left parietal region, stable. No acute intracranial abnormality. Specifically, no hemorrhage, hydrocephalus, mass lesion, acute infarction, or significant intracranial injury. Vascular: No hyperdense vessel or unexpected calcification. Skull: No acute calvarial abnormality. Sinuses/Orbits: Mucosal thickening in the right maxillary sinus. No air-fluid levels. Other:  Posterior scalp hematoma in the left occipital region. 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 No acute intracranial abnormality. Electronically Signed   By: Rolm Baptise M.D.   On: 07/26/2020 13:14   CT Cervical Spine Wo Contrast  Result Date: 07/26/2020 CLINICAL DATA:  Fall, on blood thinners. EXAM: CT CERVICAL SPINE WITHOUT CONTRAST TECHNIQUE: Multidetector CT imaging of the cervical spine was performed without intravenous contrast. Multiplanar CT image reconstructions were also generated. COMPARISON:  None FINDINGS: Alignment: No subluxation. Skull base and vertebrae: No acute fracture. No primary bone lesion or focal pathologic process. Soft tissues and spinal canal: No prevertebral fluid or swelling. No visible canal hematoma. Disc levels: Diffuse degenerative disc disease with disc space narrowing and spurring. Diffuse degenerative facet disease bilaterally. Upper chest: No acute findings. Other: None IMPRESSION: Diffuse degenerative disc and facet disease. No acute bony abnormality. Electronically Signed   By: Rolm Baptise M.D.   On: 07/26/2020 13:15   DG Chest Portable 1 View  Result Date: 07/27/2020 CLINICAL DATA:  80 year old male status post intubation. EXAM: PORTABLE CHEST 1 VIEW COMPARISON:  Chest radiograph dated 07/26/2020. FINDINGS: Endotracheal tube with tip approximately 4.5 cm above the carina. Enteric tube extends below the diaphragm with tip beyond the inferior margin of the image. Left lung base atelectasis versus less likely infiltrate. There is no pleural effusion pneumothorax. Stable cardiomegaly. 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 A battery pack noted over the left chest. Atherosclerotic calcification of the aorta. No acute osseous pathology. IMPRESSION: 1. Endotracheal tube above the carina. 2. Left lung base atelectasis versus less likely infiltrate.  Electronically Signed   By: Anner Crete M.D.   On: 07/27/2020 20:31   DG Chest Portable 1  View  Result Date: 07/26/2020 CLINICAL DATA:  Syncope EXAM: PORTABLE CHEST 1 VIEW COMPARISON:  08/06/2013 FINDINGS: Prior sternotomy and cardiac valve replacement. Cardiomegaly. Atherosclerotic calcification of the aortic knob. Mild pulmonary vascular congestion. No focal airspace consolidation, pleural effusion, or pneumothorax. IMPRESSION: Cardiomegaly and mild pulmonary vascular congestion. Electronically Signed   By: Davina Poke D.O.   On: 07/26/2020 13:08   CT HEAD CODE STROKE WO CONTRAST  Result Date: 07/27/2020 CLINICAL DATA:  Code stroke. EXAM: CT HEAD WITHOUT CONTRAST TECHNIQUE: Contiguous axial images were obtained from the base of the skull through the vertex without intravenous contrast. COMPARISON:  07/26/2020 FINDINGS: Brain: Acute, mixed density right cerebral convexity subdural hematoma measuring up to 2.2 cm in thickness. Areas of low density likely due to presence of anticoagulation. Additional acute subdural hematoma underlying the posterior left cerebral convexity measuring up to 1.1 cm in thickness. Small volume subdural hemorrhage is present along the right aspect of the falx and tentorium. There is mass effect with regional sulcal effacement. Subfalcine herniation is present with leftward midline shift measuring up to 1.8 cm. There is early trapping of the left lateral ventricle. Partial effacement of the suprasellar cistern. No acute appearing loss of gray-white differentiation. Chronic left parietotemporal infarct with expected dilatation of the posterior left lateral ventricle. Vascular: Atherosclerotic calcification at the skull base. Skull: Calvarium is intact. Sinuses/Orbits: Right maxillary sinus mucosal thickening. Other: Mastoid air cells are clear. Left posterior scalp soft tissue swelling. IMPRESSION: Acute right much larger than left subdural hematomas. Additional small volume subdural hemorrhage along the falx and tentorium. Mass effect including subfalcine herniation  with leftward midline shift of 1.8 cm and early trapping of the left lateral ventricle. These results were communicated to Dr. Leonel Ramsay at 6:46 pm on 07/27/2020 by text page via the [facility] messaging system. Electronically Signed   By: Macy Mis M.D.   On: 07/27/2020 18:54    Assessment/Plan: Much to our surprise, the patient has drastically improved early this morning. We are going to order a CT head this morning and plan on doing a right sided craniotomy for evacuation of subdural hematoma. We have spoken with the family and they are agreeable.    LOS: 1 day    Ocie Cornfield Ormond Lazo 07/28/2020, 8:06 AM

## 2020-07-28 NOTE — Progress Notes (Signed)
Per night shift RN Edwena Felty and neurosurgery progress notes, patient had been extensor posturing in all four extremities all night. Edwena Felty and I assessed him together this morning and we noticed localization of pain in left upper and lower extremities and withdrawal to pain in right upper and lower extremities.  Patient has obvious cough, gag, and equal, round and brisk pupillary response. I will forward this information to the neuro rounding team this AM.

## 2020-07-28 NOTE — Anesthesia Preprocedure Evaluation (Signed)
Anesthesia Evaluation  Patient identified by MRN, date of birth, ID band Patient awake    Reviewed: Allergy & Precautions, H&P , NPO status , Patient's Chart, lab work & pertinent test results  Airway Mallampati: Intubated       Dental no notable dental hx. (+) Dental Advisory Given   Pulmonary  Intubated and sedated   Pulmonary exam normal breath sounds clear to auscultation       Cardiovascular hypertension, On Medications and On Home Beta Blockers +CHF  + dysrhythmias Atrial Fibrillation  Rhythm:Regular Rate:Normal     Neuro/Psych CVA negative psych ROS   GI/Hepatic negative GI ROS, Neg liver ROS,   Endo/Other  negative endocrine ROS  Renal/GU negative Renal ROS  negative genitourinary   Musculoskeletal   Abdominal   Peds  Hematology negative hematology ROS (+)   Anesthesia Other Findings   Reproductive/Obstetrics negative OB ROS                             Anesthesia Physical Anesthesia Plan  ASA: IV  Anesthesia Plan: General   Post-op Pain Management:    Induction: Intravenous  PONV Risk Score and Plan: 2 and Treatment may vary due to age or medical condition  Airway Management Planned: Oral ETT  Additional Equipment:   Intra-op Plan:   Post-operative Plan: Post-operative intubation/ventilation  Informed Consent: I have reviewed the patients History and Physical, chart, labs and discussed the procedure including the risks, benefits and alternatives for the proposed anesthesia with the patient or authorized representative who has indicated his/her understanding and acceptance.     Dental advisory given  Plan Discussed with: CRNA  Anesthesia Plan Comments:         Anesthesia Quick Evaluation

## 2020-07-28 NOTE — Anesthesia Postprocedure Evaluation (Signed)
Anesthesia Post Note  Patient: [name] [name]  Procedure(s) Performed: CRANIOTOMY FOR EVACUATION OF SUBDURAL HEMATOMA (N/A Head)     Patient location during evaluation: SICU Anesthesia Type: General Level of consciousness: sedated Pain management: pain level controlled Vital Signs Assessment: post-procedure vital signs reviewed and stable Respiratory status: patient remains intubated per anesthesia plan Cardiovascular status: stable Postop Assessment: no apparent nausea or vomiting Anesthetic complications: no   No complications documented.  Last Vitals:  Vitals:   07/28/20 1717 07/28/20 1800  BP:  (!) 148/79  Pulse: 70 72  Resp: 18 18  Temp:    SpO2: 100% 100%    Last Pain:  Vitals:   07/28/20 1200  TempSrc: Axillary                 Tyanna Hach,W. EDMOND

## 2020-07-28 NOTE — Progress Notes (Signed)
Patient looks much better this morning as he is opening his eyes to voice, following commands with all 4 extremities, remains intubated.  CT scan reviewed the report is not done.  It looks very similar to me.  Large right-sided subdural hematoma with mass-effect and shift, with a small left-sided posterior parietal subdural hematoma without significant mass-effect or shift.  I have called and spoken with the son and told him that the patient has improved significantly compared to last night when he had decerebrate posturing.  I cannot explain the improvement but we are all thrilled.  We will plan on craniotomy for evacuation of the subdural hematoma today.  They understand the risk of the surgery include but are not limited to bleeding, infection, need for further surgery, reaccumulation, lack of relief of symptoms, worsening symptoms, stroke, weakness, numbness, loss of vision, and anesthesia risk including DVT pneumonia MI and death.  They agree to proceed.

## 2020-07-28 NOTE — Progress Notes (Signed)
Pharmacy: Dofetilide (Tikosyn) - Follow Up Assessment and Electrolyte Replacement  Pharmacy consulted to assist in monitoring and replacing electrolytes in this 80 y.o. male admitted on 07/27/2020 and to resume Tikosyn.  Labs:    Component Value Date/Time   K 3.8 07/28/2020 1848   K 4.1 11/26/2013 0000   MG 2.1 07/28/2020 1848     Plan: Potassium: K 3.8-3.9:  Give KCl 40 mEq po x1   Magnesium: Mg > 2: No additional supplementation needed   Dynasti Kerman D. Mina Marble, PharmD, BCPS, [place] 07/28/2020, 7:44 PM

## 2020-07-28 NOTE — Transfer of Care (Signed)
Immediate Anesthesia Transfer of Care Note  Patient: [name] [name]  Procedure(s) Performed: CRANIOTOMY FOR EVACUATION OF SUBDURAL HEMATOMA (N/A Head)  Patient Location: ICU  Anesthesia Type:General  Level of Consciousness: sedated and Patient remains intubated per anesthesia plan  Airway & Oxygen Therapy: Patient remains intubated per anesthesia plan and Patient placed on Ventilator (see vital sign flow sheet for setting)  Post-op Assessment: Report given to RN and Post -op Vital signs reviewed and stable  Post vital signs: Reviewed and stable  Last Vitals:  Vitals Value Taken Time  BP 165/83 07/28/20 1722  Temp    Pulse 71 07/28/20 1724  Resp 18 07/28/20 1724  SpO2 100 % 07/28/20 1724  Vitals shown include unvalidated device data.  Last Pain:  Vitals:   07/28/20 1200  TempSrc: Axillary         Complications: No complications documented.

## 2020-07-28 NOTE — Consult Note (Signed)
NAME:  [name] [name], MRN:  [id number], DOB:  02-12-1941, LOS: 1 ADMISSION DATE:  07/27/2020, CONSULTATION DATE: 05/20/222 REFERRING MD: Dr. Ronnald Ramp, CHIEF COMPLAINT: Bilateral subdural hematoma  History of Present Illness:  This is a 80 year old male with presented to the emergency room from home.  Patient fell while at church.  Patient was seen in the emergency room with a negative head CT.  Patient then went home took a nap and woke up with garbled speech.  CT of the head was obtained which demonstrated a large right subdural hematoma and smaller left subdural hematoma.  Patient was on Coumadin for prior strokes.  Patient was admitted to the intensive care unit.  He continues intubated  for airway protection.  Neurosurgery evaluated the patient and felt even with surgical intervention prognosis was extremely poor.   Pertinent  Medical History  Atrial fibrillation CHF Diverticulosis Pain CVA Thoracic aortic aneurysm  Significant Hospital Events: Including procedures, antibiotic start and stop dates in addition to other pertinent events   .     Objective   Blood pressure (!) 150/76, pulse 63, temperature 99.7 F (37.6 C), temperature source Oral, resp. rate 19, weight 93 kg, SpO2 100 %.    Vent Mode: PRVC FiO2 (%):  [40 %-100 %] 40 % Set Rate:  [18 bmp] 18 bmp Vt Set:  [55 mL-550 mL] 55 mL PEEP:  [5 cmH20] 5 cmH20 Plateau Pressure:  [11 CBJ62-83 cmH20] 18 cmH20   Intake/Output Summary (Last 24 hours) at 07/28/2020 0234 Last data filed at 07/27/2020 2031 Gross per 24 hour  Intake 45.03 ml  Output --  Net 45.03 ml   Filed Weights   07/27/20 1903  Weight: 93 kg    Examination: General: No acute distress HENT: Orally intubated with endotracheal tube Lungs: Clear to auscultation bilaterally.  No wheezing rales or rhonchi Cardiovascular: Regular rate no murmur no gallop Abdomen: Soft, nondistended, no rebound/rigidity/guarding and limited by neuro status.  Positive bowel  sounds Extremities: Distal pulse intact and equal.  No significant edema or cyanosis.   Neuro: Patient is unconscious/unresponsive.  Positive decerebrate posturing.  No gag or cough GU: Foley catheter intact  Labs/imaging that I havepersonally reviewed  (right click and "Reselect all SmartList Selections" daily)      Assessment & Plan:  Acute respiratory failure  Large right subdural hematoma Small left subdural hematoma Coumadin induced coagulopathy  Plan: Patient is currently a DNR.  Family is gathering later today to make further decisions concerning comfort measures only. We will continue current ventilator management}. Slowly weaning propofol as tolerated.  Titrate for patient ventilator interaction. Monitor I/os. Blood pressure is currently appropriately controlled. Continue serial neurochecks   Best practice (right click and "Reselect all SmartList Selections" daily)  Diet:  NPO Pain/Anxiety/Delirium protocol (if indicated): Yes (RASS goal -1) VAP protocol (if indicated): Yes DVT prophylaxis: SCD GI prophylaxis: PPI Glucose control:  SSI No Central venous access:  N/A Arterial line:  N/A Foley:  Yes, and it is still needed Mobility:  bed rest  PT consulted: N/A Last date of multidisciplinary goals of care discussion []  Code Status:  DNR Disposition: Admitted to the intensive care unit  Labs   CBC: Recent Labs  Lab 07/26/20 1227 07/27/20 1833 07/27/20 1837 07/27/20 1949  WBC 8.5 14.7*  --   --   NEUTROABS 4.8 11.7*  --   --   HGB 13.7 12.7* 12.6* 11.6*  HCT 42.1 38.4* 37.0* 34.0*  MCV 95.5 94.1  --   --  PLT 203 216  --   --     Basic Metabolic Panel: Recent Labs  Lab 07/26/20 1227 07/27/20 1833 07/27/20 1837 07/27/20 1949  NA 138 138 141 140  K 4.3 4.1 4.0 3.8  CL 107 107 107  --   CO2 22 23  --   --   GLUCOSE 152* 140* 138*  --   BUN 22 16 17   --   CREATININE 0.98 0.94 0.90  --   CALCIUM 9.1 8.8*  --   --    GFR: Estimated Creatinine  Clearance: 74.8 mL/min (by C-G formula based on SCr of 0.9 mg/dL). Recent Labs  Lab 07/26/20 1227 07/27/20 1833  WBC 8.5 14.7*    Liver Function Tests: Recent Labs  Lab 07/27/20 1833  AST 34  ALT 33  ALKPHOS 65  BILITOT 0.9  PROT 7.0  ALBUMIN 3.8   No results for input(s): LIPASE, AMYLASE in the last 168 hours. No results for input(s): AMMONIA in the last 168 hours.  ABG    Component Value Date/Time   PHART 7.434 07/27/2020 1949   PCO2ART 40.2 07/27/2020 1949   PO2ART 504 (H) 07/27/2020 1949   HCO3 27.0 07/27/2020 1949   TCO2 28 07/27/2020 1949   O2SAT 100.0 07/27/2020 1949     Coagulation Profile: Recent Labs  Lab 07/26/20 1227 07/27/20 1833  INR 3.4* 3.4*    Cardiac Enzymes: No results for input(s): CKTOTAL, CKMB, CKMBINDEX, TROPONINI in the last 168 hours.  HbA1C: Hemoglobin A1C  Date/Time Value Ref Range Status  03/22/2018 03:04 PM 6.1 (A) 4.0 - 5.6 % Final   Hgb A1c MFr Bld  Date/Time Value Ref Range Status  07/24/2019 02:14 PM 6.3 (H) 4.8 - 5.6 % Final    Comment:    (NOTE) Pre diabetes:          5.7%-6.4% Diabetes:              >6.4% Glycemic control for   <7.0% adults with diabetes   05/22/2017 09:30 AM 5.9 4.6 - 6.5 % Final    Comment:    Glycemic Control Guidelines for People with Diabetes:Non Diabetic:  <6%Goal of Therapy: <7%Additional Action Suggested:  >8%     CBG: Recent Labs  Lab 07/26/20 1222 07/27/20 [place] 160* 125*    Review of Systems:   Unable to see complete secondary to patient's neurologic condition  Past Medical History:  He,  has a past medical history of Acute rheumatic heart disease, unspecified, Acute rheumatic pericarditis, Atrial fibrillation ([place]), CHF (congestive heart failure) ([place]), Diverticulosis, Dysrhythmia, 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   Surgical History:   Past Surgical History:  Procedure Laterality Date  . AORTIC AND MITRAL VALVE REPLACEMENT     09/2004  . CARDIOVERSION     3 times from 2004-2006  . CARDIOVERSION N/A 09/26/2013   Procedure: CARDIOVERSION;  Surgeon: Larey Dresser, MD;  Location: [place];  Service: Cardiovascular;  Laterality: N/A;  . CARDIOVERSION N/A 06/19/2014   Procedure: CARDIOVERSION;  Surgeon: Jerline Pain, MD;  Location: [place];  Service: Cardiovascular;  Laterality: N/A;  . CARDIOVERSION N/A 04/24/2017   Procedure:  CARDIOVERSION;  Surgeon: Larey Dresser, MD;  Location[facility] END[facility]COPY;  Service: Cardiovascular;  Laterality: N/A;  . CARDIOVERSION N/A 06/08/2017   Procedure: CARDIOVERSION;  Surgeon: Pixie Casino, MD;  Location[facility] ENDOSCOPY;  Service: Cardiovascular;  Laterality: N/A;  . CARDIOVERSION N/A 08/24/2017   Procedure: CARDIOVERSION;  Surgeon: Skeet Latch, MD;  Location[facility] ENDOSCOPY;  Service: Cardiovascular;  Laterality: N/A;  . CARDIOVERSION N/A 02/14/2018   Procedure: CARDIOVERSION;  Surgeon: Larey Dresser, MD;  Location: [facility] ENDOSCOPY;  Service: Cardiovascular;  Laterality: N/A;  . COLONOSCOPY    . laminectomies     10/1975 and in 03/2005  . TONSILLECTOMY     1950  . TOTAL HIP ARTHROPLASTY Right 04/03/2018   Procedure: TOTAL HIP ARTHROPLASTY ANTERIOR APPROACH;  Surgeon: Paralee Cancel, MD;  Location: [facility] ORS;  Service: Orthopedics;  Laterality: Right;  70 mins     Social History:   reports that he has never smoked. He has never used smokeless tobacco. He reports that he does not drink alcohol and does not use drugs.   Family History:  His family history includes Cancer in  his father; Diabetes in his brother and paternal aunt; Heart attack in his maternal grandfather; Macular degeneration in his mother.   Allergies Allergies  Allergen Reactions  . Naproxen Hives  . No [employer] Food Allergies Other (See Comments)    Scallops - distress, nausea and vomitting     Home Medications  Prior to Admission medications   Medication Sig Start Date End Date Taking? Authorizing Provider  atorvastatin (LIPITOR) 80 MG tablet TAKE 1 TABLET (80 MG TOTAL) BY MOUTH EVERY EVENING. 04/07/20   Larey Dresser, MD  atorvastatin (LIPITOR) 80 MG tablet Take 1 tablet by mouth daily. 06/29/20   [provider]  clobetasol cream (TEMOVATE) 0.09 % Apply 1 application topically daily as needed (for skin irritation).  05/20/14   [provider]  Coenzyme Q10 (CO Q-10) 300 MG CAPS Take 300 mg by mouth daily.    [provider]  COVID-19 mRNA vaccine, Moderna, 100 MCG/0.5ML injection Inject into the muscle. 06/26/20   Carlyle Basques, MD  Cyanocobalamin (VITAMIN B-12) 5000 MCG SUBL Place 5,000 mcg under the tongue daily at 2 PM.     [provider]  docusate sodium (COLACE) 100 MG capsule Take 100 mg by mouth daily.    [provider]  dofetilide (TIKOSYN) 500 MCG capsule Take 1 capsule (500 mcg total) by mouth 2 (two) times daily. 03/26/20   Larey Dresser, MD  dofetilide (TIKOSYN) 500 MCG capsule Take 500 mcg by mouth every 12 (twelve) hours. 05/20/20   [provider]  finasteride (PROSCAR) 5 MG tablet Take 5 mg by mouth daily.    [provider]  furosemide (LASIX) 20 MG tablet Take 1 tablet (20 mg total) by mouth daily. 03/26/20   Larey Dresser, MD  Glucosamine-Chondroitin (COSAMIN DS PO) Take 1 tablet by mouth 2 (two) times daily.    [provider]  loratadine (CLARITIN) 10 MG tablet Take 10 mg by mouth daily as needed for allergies.     [provider]  losartan (COZAAR) 25 MG tablet Take 1 tablet (25 mg  total) by mouth daily. 03/26/20   Larey Dresser, MD  losartan (COZAAR) 25 MG tablet Take 25 mg by mouth daily. 04/06/20   [provider]  metoprolol succinate (TOPROL-XL) 50 MG 24 hr tablet Take 75 mg (1.5 tabs) by mouth twice daily. Take with or  immediately following a meal. 05/22/20   Larey Dresser, MD  metoprolol succinate (TOPROL-XL) 50 MG 24 hr tablet Take 50 mg by mouth every 12 (twelve) hours. 05/31/20   [provider]  Multiple Vitamins-Minerals (MULTIVITAL-M) TABS Take 1 tablet by mouth daily.     [provider]  potassium chloride SA (KLOR-CON) 20 MEQ tablet Take 1 tablet (20 mEq total) by mouth daily. 03/26/20   Larey Dresser, MD  potassium chloride SA (KLOR-CON) 20 MEQ tablet Take 20 mEq by mouth daily. 04/22/20   [provider]  Probiotic Product (PROBIOTIC FORMULA PO) Take 1 capsule by mouth 2 (two) times daily.     [provider]  tamsulosin (FLOMAX) 0.4 MG CAPS capsule Take 0.4 mg by mouth 2 (two) times daily. 09/14/18   [provider]  tamsulosin (FLOMAX) 0.4 MG CAPS capsule Take 0.8 mg by mouth every 12 (twelve) hours. 07/07/20   [provider]  warfarin (COUMADIN) 5 MG tablet As directed 03/26/20   Larey Dresser, MD  warfarin (COUMADIN) 5 MG tablet Take 5 mg by mouth daily. 07/22/20   [provider]     Critical care time: 45 minutes

## 2020-07-28 NOTE — Progress Notes (Signed)
Dr. Ronnald Ramp and Margo Aye NP explained surgical procedure to patient's son and wife at this time. I signed the consent form as a witness and the wife, Herbert Pun, signed for the patient. Will prepare patient for OR at this time.

## 2020-07-28 NOTE — Op Note (Signed)
07/28/2020  5:06 PM  PATIENT:  [name] [name]  80 y.o. male  PRE-OPERATIVE DIAGNOSIS: Large acute right subdural hematoma  POST-OPERATIVE DIAGNOSIS:  same  PROCEDURE: Right craniotomy for evacuation of subdural hematoma  SURGEON:  Sherley Bounds, MD  ASSISTANTS: Glenford Peers FNP  ANESTHESIA:   General  EBL: 300 ml  Total I/O In: 1249.8 [I.V.:1099.8; IV Piggyback:150] Out: 4580 [Urine:1050; Blood:300]  BLOOD ADMINISTERED: none  DRAINS: Subdural drain, subgaleal drain  SPECIMEN:  none  INDICATION FOR PROCEDURE: This patient presented with mental status changes and decerebrate posturing with loss of some brainstem reflexes. Imaging showed large right acute subdural hematoma.  Originally we recommended no surgical intervention because of his decerebrate posturing and poor prognosis.  It appeared acutely he improved this morning to open his eyes and follow commands recommended right craniotomy for evacuation of the subdural hematoma in hopes that he would have a good outcome. Patient understood the risks, benefits, and alternatives and potential outcomes and wished to proceed.  PROCEDURE DETAILS: The patient was taken to the operating room and after induction of adequate generalized endotracheal anesthesia, the head was affixed in a 3 point [place] head rest, and turned to the left to expose the right frontotemporal parietal region. The head was shaved and then cleaned and then prepped with DuraPrep and draped in the usual sterile fashion. 10 cc of local anesthetic was injected, and a curvilinear incision was made on the right of the head right above the superior temporal line. Raney clips were placed to establish hemostasis of the scalp, the muscle was reflected with the scalp flap, to expose the right frontotemporal parietal region. A burr hole was placed in the temporal region, and a craniotomy flap was turned utilizing the high-speed, electric drill. The flap was then placed in  bacitracin-containing saline solution, and the dura was opened to expose large right sided acute subdural hematoma. A hematoma was then removed with a combination of irrigation and suction. I continued to irrigate until the irrigant was clear to, and dried any bleeding with bipolar cautery.  The brain was pulsatile and looked healthy.  We worked up under the bone flap to remove large amounts of subdural from the frontal region, temporal region and parietal and even occipital region.  We then irrigated with large amounts of warm saline until the irrigant was clear.  We then checked for any bleeding on the surface of the brain and found none.  I then placed a subdural drain through separate stab incision and close the dura with a running 4-0 Nurolon suture. Dural tack up sutures were placed. The dura was lined with Gelfoam, a medium Hemovac drain was placed in the subgaleal space and the craniotomy flap was replaced with doggie-bone plates. The wound was copiously irrigated. A subgaleal drain was placed, and the galea was then closed with interrupted 2-0 Vicryl suture. The skin was then closed with staples a sterile dressing was applied. The patient was then taken out of the 3-point [place] headrest and taken to the ICU in guarded condition. At the end of the procedure all sponge, needle, and instrument counts were correct.   PLAN OF CARE: Admit to inpatient   PATIENT DISPOSITION:  ICU - intubated and hemodynamically stable.   Delay start of Pharmacological VTE agent (>24hrs) due to surgical blood loss or risk of bleeding:  yes

## 2020-07-28 NOTE — Progress Notes (Signed)
Patient transported from ED Room 22 to 9C78 with no complications.

## 2020-07-29 ENCOUNTER — Inpatient Hospital Stay (HOSPITAL_COMMUNITY): Payer: Medicare Other

## 2020-07-29 DIAGNOSIS — S065X9A Traumatic subdural hemorrhage with loss of consciousness of unspecified duration, initial encounter: Principal | ICD-10-CM

## 2020-07-29 LAB — BASIC METABOLIC PANEL
Anion gap: 7 (ref 5–15)
BUN: 17 mg/dL (ref 8–23)
CO2: 22 mmol/L (ref 22–32)
Calcium: 8.2 mg/dL — ABNORMAL LOW (ref 8.9–10.3)
Chloride: 109 mmol/L (ref 98–111)
Creatinine, Ser: 0.85 mg/dL (ref 0.61–1.24)
GFR, Estimated: >60 mL/min (ref >60)
Glucose, Bld: 161 mg/dL — ABNORMAL HIGH (ref 70–99)
Potassium: 4.1 mmol/L (ref 3.5–5.1)
Sodium: 138 mmol/L (ref 135–145)

## 2020-07-29 LAB — PROTIME-INR
INR: 1.1 (ref 0.8–1.2)
Prothrombin Time: 14.7 seconds (ref 11.4–15.2)

## 2020-07-29 LAB — MAGNESIUM: Magnesium: 2 mg/dL (ref 1.7–2.4)

## 2020-07-29 MED ORDER — ACETAMINOPHEN 325 MG PO TABS
650.0000 mg | ORAL_TABLET | ORAL | Status: DC | PRN
  Administered 2020-07-29: 650 mg

## 2020-07-29 MED ORDER — HYDROCODONE-ACETAMINOPHEN 5-325 MG PO TABS
1.0000 | ORAL_TABLET | ORAL | Status: DC | PRN
Start: 2020-07-29 — End: 2020-07-31
  Filled 2020-07-29: qty 1

## 2020-07-29 MED ORDER — METOPROLOL TARTRATE 25 MG/10 ML ORAL SUSPENSION: 50.0000 mg | Freq: Two times a day (BID) | ORAL | Status: DC

## 2020-07-29 MED ORDER — ORAL CARE MOUTH RINSE
15.0000 mL | OROMUCOSAL | Status: DC
  Administered 2020-07-29 (×6): 15 mL via OROMUCOSAL

## 2020-07-29 MED ORDER — DOFETILIDE 500 MCG PO CAPS
500.0000 ug | ORAL_CAPSULE | Freq: Two times a day (BID) | ORAL | Status: DC
  Administered 2020-07-29 – 2020-07-31 (×4): 500 ug via ORAL
  Filled 2020-07-29 (×5): qty 1

## 2020-07-29 MED ORDER — ACETAMINOPHEN 650 MG RE SUPP: 650.0000 mg | RECTAL | Status: DC | PRN

## 2020-07-29 MED ORDER — CHLORHEXIDINE GLUCONATE 0.12% ORAL RINSE (MEDLINE KIT)
15.0000 mL | Freq: Two times a day (BID) | OROMUCOSAL | Status: DC
  Administered 2020-07-29 – 2020-07-31 (×5): 15 mL via OROMUCOSAL

## 2020-07-29 MED ORDER — SENNOSIDES 8.8 MG/5ML PO SYRP: 5.0000 mL | ORAL_SOLUTION | Freq: Two times a day (BID) | ORAL | Status: DC

## 2020-07-29 MED ORDER — PROMETHAZINE HCL 25 MG PO TABS: 12.5000 mg | ORAL_TABLET | ORAL | Status: DC | PRN

## 2020-07-29 MED ORDER — METHOCARBAMOL 1000 MG/10ML IJ SOLN
500.0000 mg | Freq: Three times a day (TID) | INTRAVENOUS | Status: DC | PRN
  Administered 2020-07-29: 500 mg via INTRAVENOUS
  Filled 2020-07-29: qty 5

## 2020-07-29 MED ORDER — LOSARTAN POTASSIUM 50 MG PO TABS
25.0000 mg | ORAL_TABLET | Freq: Every day | ORAL | Status: DC
  Administered 2020-07-30 – 2020-07-31 (×2): 25 mg via ORAL
  Filled 2020-07-29 (×2): qty 1

## 2020-07-29 MED ORDER — METOPROLOL TARTRATE 50 MG PO TABS
50.0000 mg | ORAL_TABLET | Freq: Two times a day (BID) | ORAL | Status: DC
  Administered 2020-07-29 – 2020-07-31 (×4): 50 mg via ORAL
  Filled 2020-07-29 (×4): qty 1

## 2020-07-29 MED ORDER — SENNA 8.6 MG PO TABS
1.0000 | ORAL_TABLET | Freq: Two times a day (BID) | ORAL | Status: DC
  Administered 2020-07-29 – 2020-07-30 (×2): 8.6 mg via ORAL
  Filled 2020-07-29 (×2): qty 1

## 2020-07-29 MED ORDER — ONDANSETRON HCL 4 MG PO TABS: 4.0000 mg | ORAL_TABLET | ORAL | Status: DC | PRN

## 2020-07-29 MED ORDER — ACETAMINOPHEN 325 MG PO TABS
650.0000 mg | ORAL_TABLET | ORAL | Status: DC | PRN
  Administered 2020-07-31: 650 mg via ORAL
  Filled 2020-07-29: qty 2

## 2020-07-29 MED ORDER — HYDROCODONE-ACETAMINOPHEN 5-325 MG PO TABS
1.0000 | ORAL_TABLET | ORAL | Status: DC | PRN
Start: 2020-07-29 — End: 2020-07-29

## 2020-07-29 MED ORDER — ONDANSETRON HCL 4 MG/2ML IJ SOLN: 4.0000 mg | INTRAMUSCULAR | Status: DC | PRN

## 2020-07-29 MED ORDER — FUROSEMIDE 20 MG PO TABS
20.0000 mg | ORAL_TABLET | Freq: Every day | ORAL | Status: DC
  Administered 2020-07-30 – 2020-07-31 (×2): 20 mg via ORAL
  Filled 2020-07-29 (×2): qty 1

## 2020-07-29 NOTE — Care Management (Signed)
CM Consult for Tikosyn initiation:  Have requested benefit check to check insurance coverage for med.  Results to follow.   Reinaldo Raddle, RN, BSN  Trauma/Neuro ICU Case Manager [phone]

## 2020-07-29 NOTE — Progress Notes (Signed)
Subjective: Patient still intubated but off of propofol, open eyes to command  Objective: Vital signs in last 24 hours: Temp:  [98.5 F (36.9 C)-99.7 F (37.6 C)] 98.5 F (36.9 C) (05/25 0800) Pulse Rate:  [61-83] 81 (05/25 0900) Resp:  [15-20] 19 (05/25 0900) BP: (143-175)/(67-116) 147/81 (05/25 0900) SpO2:  [96 %-100 %] 99 % (05/25 0900) FiO2 (%):  [30 %-40 %] 30 % (05/25 0805) Weight:  [92.7 kg] 92.7 kg (05/24 1840)  Intake/Output from previous day: 05/24 0701 - 05/25 0700 In: 2401.6 [I.V.:2101.6; IV Piggyback:300] Out: 2230 [Urine:1750; Drains:180; Blood:300] Intake/Output this shift: Total I/O In: 357 [I.V.:157; NG/GT:200] Out: -   Neurologic: Opens eyes to voice and follows commands, moving all extremties   Lab Results: Lab Results  Component Value Date   WBC 14.7 (H) 07/27/2020   HGB 11.6 (L) 07/27/2020   HCT 34.0 (L) 07/27/2020   MCV 94.1 07/27/2020   PLT 216 07/27/2020   Lab Results  Component Value Date   INR 1.1 07/29/2020   BMET Lab Results  Component Value Date   NA 138 07/29/2020   K 4.1 07/29/2020   CL 109 07/29/2020   CO2 22 07/29/2020   GLUCOSE 161 (H) 07/29/2020   BUN 17 07/29/2020   CREATININE 0.85 07/29/2020   CALCIUM 8.2 (L) 07/29/2020    Studies/Results: CT HEAD WO CONTRAST  Result Date: 07/29/2020 CLINICAL DATA:  Subdural hemorrhage. EXAM: CT HEAD WITHOUT CONTRAST TECHNIQUE: Contiguous axial images were obtained from the base of the skull through the vertex without intravenous contrast. COMPARISON:  Jul 28, 2020. FINDINGS: Brain: Interval right craniotomy for evacuation of a right subdural hemorrhage with Gel-Foam and small areas of pneumocephalus subjacent to the craniotomy site. Drain is noted, entering at the site of craniotomy and coursing along the right frontal parietal convexity, terminating at the posterior right vertex. Subdural hemorrhage is significantly decreased in size. At the site of craniectomy, extra-axial material  measures up to approximately 1 cm; however, this is largely from Gel-Foam with small amount hemorrhage at this site. More anteriorly, subdural hemorrhage measures up to approximately 7 mm in thickness. Mass effect is improved with approximately 5 mm of leftward midline shift at the foramina Leon. Leftward subfalcine herniation is also improved. Basal cisterns are patent. Hyperdense hemorrhage layers along the falx and right tentorial leaflet, not substantially changed. Similar small volume of subarachnoid hemorrhage along the high left frontal parasagittal convexity. Additional site of extra-axial hemorrhage along the left parietotemporal convexity, measuring up to 1 cm in thickness (series 3, image 19) with similar adjacent area of hypodensity. Similar small volume of intraventricular hemorrhage layering in the left occipital horn. No progressive ventriculomegaly. No evidence of interval large vascular territory infarct. Similar punctate nonspecific areas of mineralization in bilateral frontal lobes. Vascular: No hyperdense vessel identified. Calcific atherosclerosis. Skull: Posterior left scalp contusion without acute fracture. Sinuses/Orbits: Mucosal thickening of the right greater than left maxillary sinuses with frothy secretions in the right maxillary sinus. Other: No mastoid effusions. IMPRESSION: 1. Significantly decreased size of an acute right subdural hemorrhage status post craniotomy and drainage, as detailed above. Improved mass effect with 5 mm of leftward midline shift. 2. Similar subdural hemorrhage along the falx and right tentorial leaflet, small volume high left frontal parasagittal subarachnoid hemorrhage, small volume intraventricular hemorrhage, and additional 1.1 cm thick extra-axial hemorrhage along the left parietotemporal convexity. Electronically Signed   By: Margaretha Sheffield MD   On: 07/29/2020 06:46   CT HEAD WO CONTRAST  Result  Date: 07/28/2020 CLINICAL DATA:  Altered mental  status EXAM: CT HEAD WITHOUT CONTRAST TECHNIQUE: Contiguous axial images were obtained from the base of the skull through the vertex without intravenous contrast. COMPARISON:  07/27/2020 FINDINGS: Brain: There are again noted bilateral subdural hematomas right greater than left. Mixed attenuation is noted particularly on the right. Significant mass effect upon the right cerebral hemisphere is noted with evidence of midline shift. The degree of shift is approximately 10 mm chest improved when compared with the prior exam. Additionally the right lateral ventricle appears more prominent when compared with the previous day. Hemorrhage along the tentorium on the right as well as the falx cerebri is again seen. Mild atrophic changes are noted. A small amount of intraventricular hemorrhage in the left lateral ventricle is noted posteriorly. No other definitive intraventricular hemorrhage is seen. 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 No new infarct is seen at this time. Vascular: No hyperdense vessel or unexpected calcification. Skull: Normal. Negative for fracture or focal lesion. Sinuses/Orbits: Air-fluid level is noted within the right maxillary antrum. Other: Mild scalp swelling is noted on the left posteriorly consistent with the recent injury. IMPRESSION: Persistent bilateral subdural hematomas slightly smaller than that seen on the prior exam with a lesser degree of midline shift when compared with the prior study. Small amount of intraventricular hemorrhage is noted in the left lateral ventricle. Atrophic changes and findings of prior left temporoparietal infarct are noted. Electronically Signed   By: Inez Catalina M.D.   On: 07/28/2020 08:26   DG Chest Portable 1 View  Result Date: 07/27/2020 CLINICAL DATA:  80 year old male status post intubation. EXAM: PORTABLE CHEST 1 VIEW COMPARISON:  Chest radiograph dated 07/26/2020. FINDINGS: Endotracheal tube with tip approximately 4.5 cm above  the carina. Enteric tube extends below the diaphragm with tip beyond the inferior margin of the image. Left lung base atelectasis versus less likely infiltrate. There is no pleural effusion pneumothorax. Stable cardiomegaly. 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 A battery pack noted over the left chest. Atherosclerotic calcification of the aorta. No acute osseous pathology. IMPRESSION: 1. Endotracheal tube above the carina. 2. Left lung base atelectasis versus less likely infiltrate. Electronically Signed   By: Anner Crete M.D.   On: 07/27/2020 20:31   CT HEAD CODE STROKE WO CONTRAST  Result Date: 07/27/2020 CLINICAL DATA:  Code stroke. EXAM: CT HEAD WITHOUT CONTRAST TECHNIQUE: Contiguous axial images were obtained from the base of the skull through the vertex without intravenous contrast. COMPARISON:  07/26/2020 FINDINGS: Brain: Acute, mixed density right cerebral convexity subdural hematoma measuring up to 2.2 cm in thickness. Areas of low density likely due to presence of anticoagulation. Additional acute subdural hematoma underlying the posterior left cerebral convexity measuring up to 1.1 cm in thickness. Small volume subdural hemorrhage is present along the right aspect of the falx and tentorium. There is mass effect with regional sulcal effacement. Subfalcine herniation is present with leftward midline shift measuring up to 1.8 cm. There is early trapping of the left lateral ventricle. Partial effacement of the suprasellar cistern. No acute appearing loss of gray-white differentiation. Chronic left parietotemporal infarct with expected dilatation of the posterior left lateral ventricle. Vascular: Atherosclerotic calcification at the skull base. Skull: Calvarium is intact. Sinuses/Orbits: Right maxillary sinus mucosal thickening. Other: Mastoid air cells are clear. Left posterior scalp soft tissue swelling. IMPRESSION: Acute right much larger than left subdural hematomas. Additional  small volume subdural hemorrhage along the falx and tentorium. Mass effect  including subfalcine herniation with leftward midline shift of 1.8 cm and early trapping of the left lateral ventricle. These results were communicated to Dr. Leonel Ramsay at 6:46 pm on 07/27/2020 by text page via the [facility] messaging system. Electronically Signed   By: Macy Mis M.D.   On: 07/27/2020 18:54    Assessment/Plan: Postop day 1 crani for subdural hematoma doing really well. CT head this morning looked good with resolution of sdh and significant decrease in midline shift. Really pleased with where hes at right now. Vent management per ccm.   LOS: 2 days    Ocie Cornfield [facility] 07/29/2020, 10:23 AM

## 2020-07-29 NOTE — Progress Notes (Signed)
NAME:  [name] [name], MRN:  [id number], DOB:  1940-06-10, LOS: 2 ADMISSION DATE:  07/27/2020, CONSULTATION DATE: 07/28/2020 REFERRING MD: Dr. Ronnald Ramp, CHIEF COMPLAINT: Bilateral subdural hematoma  History of Present Illness:  80 year old male with PMHx Afib (on Coumadin) with prior CVA, s/p AVR/MVR (bioprosthestic), CHF, TAA, who presented to the emergency room from home after a fall at church. CT Head in ED negative; however, patient went home to take a nap and woke up with garbled speech. Repeat CT Head demonstrated a large R subdural hematoma and smaller L subdural hematoma. Admitted to ICU and intubated for respiratory failure in the setting of AMS/inability to protect airway. NSGY initially evaluated the patient and felt even with surgical intervention, prognosis was extremely poor. Patient showed significant improvement in neurologic status 5/24AM with revamped plan for craniotomy/hematoma evacuation with NSGY.  Patient underwent evacuation of bilateral subdural hematomas on afternoon of 5/24.    PCCM consulted for assistance with ventilator management.  Pertinent Medical History:  Atrial fibrillation S/p AVR and MVR CHF Diverticulosis CVA Thoracic aortic aneurysm   Significant Hospital Events: Including procedures, antibiotic start and stop dates in addition to other pertinent events    5/23 - Presented to [facility] ED with garbled speech after a nap. [place]ell at church 5/23AM, on Coumadin for AF/prior CVA. CT Head demonstrated large R SDH, smaller L SDH. AMS with inability to protect airway, intubated. Per NSGY, poor prognosis and plan for comfort/Palliative consult.  5/24 - Significantly improved neurologic exam - opening eyes, following commands. Underwent craniotomy/hematoma evacuation at 1500.   Interim History / Subjective:  5/25: Wife, Herbert Pun, is present in room this morning.  She reports that he is even more responsive this morning than yesterday.  Patient shakes head "no" when asked if  in pain and moves arms, wiggles toes on command.  Propofol off at time of exam and pt transitioned to PSV.     Objective   Blood pressure (!) 146/92, pulse 75, temperature 98.7 F (37.1 C), temperature source Axillary, resp. rate 18, height 5\' 8"  (1.727 m), weight 92.7 kg, SpO2 98 %.    Vent Mode: PSV;CPAP FiO2 (%):  [30 %-40 %] 30 % Set Rate:  [18 bmp] 18 bmp Vt Set:  [550 mL] 550 mL PEEP:  [5 cmH20] 5 cmH20 Pressure Support:  [5 cmH20] 5 cmH20 Plateau Pressure:  [18 cmH20-19 cmH20] 19 cmH20   Intake/Output Summary (Last 24 hours) at 07/29/2020 0852 Last data filed at 07/29/2020 0600 Gross per 24 hour  Intake 2351.55 ml  Output 2230 ml  Net 121.55 ml   Filed Weights   07/27/20 1903 07/28/20 1840  Weight: 93 kg 92.7 kg   Physical Examination: General: Acutely ill-appearing man in NAD, lying in hospital bed with eyes closed. HEENT: Normocephalic, anicteric sclera, PERRL, moist mucous membranes. Neuro: Lethargic. Responds to verbal stimuli. Following commands consistently. Moves all 4 extremities spontaneously. CV: RRR, no m/g/r. PULM: Breathing even and unlabored on PSV (PEEP 5, FiO2 30%). Diffuse rhonchi present GI: Soft, nontender, nondistended. Normoactive bowel sounds. Extremities: Trace BLE edema noted. Skin: Warm/dry, no rashes, incision on right side of head with overlying dressing, dressing c/d/i, drain in place  Labs/imaging that I have personally reviewed: (right click and "Reselect all SmartList Selections" daily)  5/25 0157 Na 138, K 4.1, CO2 22, BUN 17, Cr 0.85 Ca 8.2, Mg 2.0 INR: 1.1  CT Head 5/25 0327 - Significantly decreased size of an acute right subdural hemorrhage status post craniotomy and drainage.  Improved mass effect with 5 mm of leftward midline shift. Similar subdural hemorrhage along the falx and right tentorial leaflet, small volume high left frontal parasagittal subarachnoid hemorrhage, small volume intraventricular hemorrhage, and additional 1.1  cm thick extra-axial hemorrhage along the left parietotemporal convexity.   Assessment & Plan:  Acute hypoxic respiratory failure in the setting of altered mental status - Continue full vent support (4-8cc/kg IBW) - Wean FiO2 for O2 sat > 90% - Daily WUA/SBT, possible extubation in next 24-48h - VAP bundle - Pulmonary hygiene - PAD protocol for sedation: Propofol for goal RASS 0 to -1  Large right subdural hematoma Small left subdural hematoma POD1 from craniotomy/hematoma evacuation, drain in place - Management per NSGY - Continue Keppra for seizure prophylaxis -Continue Decadron for prevention of cerebral edema - Postsurgical care per NSGY  Coumadin-induced coagulopathy History of Afib S/p AVR and MVR (bioprosthetic) S/p Vitamin K, KCentra administration - INR normalized on 5/24AM labs (today 1.1) - Will need in-depth discussion of candidacy and risk vs. benefit, timing and choice of AC agent between Cards, NSGY  Best practice (right click and "Reselect all SmartList Selections" daily)   Per primary team  Critical care time: 35 minutes   Critical care time: The patient is critically ill with multiple organ systems failure and requires high complexity decision making for assessment and support, frequent evaluation and titration of therapies, application of advanced monitoring technologies and extensive interpretation of multiple databases.  Critical care time 35 mins. This represents my time independent of the NPs time taking care of the pt. This is excluding procedures.    Audria Nine DO [facility] 07/29/2020, 3:20 PM See Amion for pager If no response to pager, please call [phone] until 1900 After 1900 please call [place] [phone] Please see [email or web address] for pager details.  From 7A-7P if no response, please call [phone] After hours, please call ELink [phone]

## 2020-07-29 NOTE — Progress Notes (Signed)
PT Cancellation Note  Patient Details Name: DERRAL COLUCCI MRN: [id number] DOB: 10-28-1940   Cancelled Treatment:    Reason Eval/Treat Not Completed: Patient not medically ready (Bedrest. Intubated).  Wyona Almas, PT, DPT Acute Rehabilitation Services Pager [phone] Office [phone]    Deno Etienne 07/29/2020, 7:50 AM

## 2020-07-29 NOTE — Progress Notes (Signed)
Transported pt on vent to CT and back to room 4N19 with no issues.

## 2020-07-29 NOTE — Procedures (Signed)
Extubation Procedure Note  Patient Details:   Name: [name] [name] DOB: 1940-06-20 MRN: [id number]   Airway Documentation:    Vent end date: 07/29/20 Vent end time: 1604   Evaluation  O2 sats: stable throughout Complications: No apparent complications Patient did tolerate procedure well. Bilateral Breath Sounds: Rhonchi   Pt extubated to 4L [name] per MD order. Pt had positive cuff leak prior to extubation. No stridor noted. Pt able to voice his name.   Vilinda Blanks 07/29/2020, 4:04 PM

## 2020-07-29 NOTE — Progress Notes (Signed)
Pt placed on PSV 5/5 per wean protocol. Pt is tolerating well at this time. RT to continue to monitor. 

## 2020-07-30 ENCOUNTER — Encounter (HOSPITAL_COMMUNITY): Payer: Self-pay | Admitting: Neurological Surgery

## 2020-07-30 DIAGNOSIS — Z9889 Other specified postprocedural states: Secondary | ICD-10-CM

## 2020-07-30 LAB — BASIC METABOLIC PANEL
Anion gap: 6 (ref 5–15)
BUN: 21 mg/dL (ref 8–23)
CO2: 21 mmol/L — ABNORMAL LOW (ref 22–32)
Calcium: 8 mg/dL — ABNORMAL LOW (ref 8.9–10.3)
Chloride: 111 mmol/L (ref 98–111)
Creatinine, Ser: 0.76 mg/dL (ref 0.61–1.24)
GFR, Estimated: >60 mL/min (ref >60)
Glucose, Bld: 166 mg/dL — ABNORMAL HIGH (ref 70–99)
Potassium: 3.9 mmol/L (ref 3.5–5.1)
Sodium: 138 mmol/L (ref 135–145)

## 2020-07-30 LAB — MAGNESIUM: Magnesium: 2.2 mg/dL (ref 1.7–2.4)

## 2020-07-30 MED ORDER — LEVETIRACETAM 500 MG PO TABS
500.0000 mg | ORAL_TABLET | Freq: Two times a day (BID) | ORAL | Status: DC
  Administered 2020-07-30 – 2020-07-31 (×3): 500 mg via ORAL
  Filled 2020-07-30 (×3): qty 1

## 2020-07-30 MED ORDER — POTASSIUM CHLORIDE CRYS ER 20 MEQ PO TBCR
40.0000 meq | EXTENDED_RELEASE_TABLET | Freq: Once | ORAL | Status: AC
  Administered 2020-07-30: 40 meq via ORAL
  Filled 2020-07-30: qty 2

## 2020-07-30 MED ORDER — PANTOPRAZOLE SODIUM 40 MG PO TBEC: 40.0000 mg | DELAYED_RELEASE_TABLET | Freq: Every day | ORAL | Status: DC

## 2020-07-30 NOTE — TOC Benefit Eligibility Note (Signed)
Transition of Care [facility]) Benefit Eligibility Note    Patient Details  Name: [name] [name] MRN: [id number] Date of Birth: 25-Apr-1940   Medication/Dose: Dofetilide 537mcg.bid for 30 day supply (Generic for TIKOSYN)  Covered?: Yes  Tier:  (4)  Prescription Coverage Preferred Pharmacy: [facility] with Person/Company/Phone Number:: [facility] [facility] [phone]  Co-Pay: $237.40  Prior Approval: No  Deductible: Unmet       [name] Phone Number: 07/30/2020, 9:21 AM

## 2020-07-30 NOTE — Progress Notes (Signed)
Pharmacy: Dofetilide (Tikosyn) - Follow Up Assessment and Electrolyte Replacement  Pharmacy consulted to assist in monitoring and replacing electrolytes in this 80 y.o. male admitted on 07/27/2020 and to continue Tikosyn.  Labs:    Component Value Date/Time   K 3.9 07/30/2020 0302   K 4.1 11/26/2013 0000   MG 2.2 07/30/2020 0302     Plan: Potassium: K 3.8-3.9:  Give KCl 40 mEq po x1   Magnesium: Mg > 2: No additional supplementation needed   [name] [name] D. Mina Marble, PharmD, BCPS, [place] 07/30/2020, 9:04 AM

## 2020-07-30 NOTE — Evaluation (Signed)
Physical Therapy Evaluation Patient Details Name: [name] [name] MRN: [id number] DOB: 06-26-1940 Today's Date: 07/30/2020   History of Present Illness  This 80 y.o. maled admitted 5/22 after awakening from nap with AMS.  EMS was activated, however pt decompenstated quickly.  CT of head showed acute Rt much larger than Lt SDH with mass effect and 1.8 cm midline shift with trapping of lateral ventricle.  Pt was decerebrate posturing with non reactive pupils and decsion was made not to proceed with craniotomy. He was intubated with plan for son to arrive from [place].  He improved over next 24 hours with increased command following and responsiveness.  He underwent craniotomy on 5/24 for evacuation of SDH and was extubated  5/25.  He  had sustained a fall the day PTA and hit the back of his head, seen in ED with negative head CT).  PMH includes:  thoracic aortic aneurysm; stroke 2015; h/o MVR, CHF, A-Fib with multiple cardioversions    Clinical Impression  Pt presents with condition above and deficits mentioned below, see PT Problem List. PTA, he was mod I with use of a SPC for mobility. Pt and his wife just sold their home and are in the process of moving. Currently, pt demonstrates bil UE and lower extremity weakness (L weaker than R), impaired visual tracking laterally bil, decreased cognitive status, impaired balance, gross incoordination, and mild sensation deficits that impact his safety and independence with all functional mobility. He is at high risk for falls demonstrating a posterior bias, needing modAx2 for transfers and to ambulate short bedroom distances with bil UE support. Will continue to follow acutely. Pt would greatly benefit from intensive therapy in the CIR setting to maximize his safety and independence with all functional mobility.    Follow Up Recommendations CIR;Supervision/Assistance - 24 hour    Equipment Recommendations  Rolling walker with 5" wheels;3in1 (PT)    Recommendations  for Other Services Rehab consult     Precautions / Restrictions Precautions Precautions: Fall Restrictions Weight Bearing Restrictions: No      Mobility  Bed Mobility Overal bed mobility: Needs Assistance Bed Mobility: Supine to Sit     Supine to sit: Mod assist;HOB elevated     General bed mobility comments: Pt needing cues to remain on task to transition to sit EOB, extra time. Pt requesting therapist's hand to pull trunk to ascend at EOB.    Transfers Overall transfer level: Needs assistance Equipment used: 2 person hand held assist Transfers: Sit to/from [facility] Sit to Stand: Mod assist;+2 physical assistance;+2 safety/equipment Stand pivot transfers: Mod assist;+2 physical assistance;+2 safety/equipment       General transfer comment: Pt requires assist for balance as he demonstrates a significant posterior bias. ModAx2 with UE support to steady and direct pt with coming to stand and taking side steps to transfer bed > chair, chair > commode, commode > chair.  Ambulation/Gait Ambulation/Gait assistance: Mod assist;+2 physical assistance;+2 safety/equipment Gait Distance (Feet): 15 Feet (x3 bouts of ~15 ft > ~3 ft > ~3 ft) Assistive device: 2 person hand held assist Gait Pattern/deviations: Step-through pattern;Decreased step length - right;Decreased step length - left;Decreased stride length;Decreased weight shift to right;Decreased weight shift to left;Shuffle;Leaning posteriorly;Narrow base of support Gait velocity: reduced Gait velocity interpretation: <1.31 ft/sec, indicative of household ambulator General Gait Details: Pt with strong posterior bias, needing cues to shift weight anteriorly into toes intermittently. Cues for upright posture. ModAx2 with bil HHA to direct and sequence weight shoft to facilitate  steps anteriorly, posteriorly, and laterally.  Stairs            Wheelchair Mobility    Modified Rankin (Stroke Patients  Only) Modified Rankin (Stroke Patients Only) Pre-Morbid Rankin Score: No significant disability Modified Rankin: Moderately severe disability     Balance Overall balance assessment: Needs assistance Sitting-balance support: Feet supported Sitting balance-Leahy Scale: Poor Sitting balance - Comments: Pt requires up to mod A for static sitting.  As he fatigues or is distracted, he leans posteriorly Postural control: Posterior lean Standing balance support: Bilateral upper extremity supported;Single extremity supported;During functional activity Standing balance-Leahy Scale: Poor Standing balance comment: pt requires mod A for static standing.  He did progress to min A by end of session. Posterior lean needing cues to correct.                             Pertinent Vitals/Pain Pain Assessment: No/denies pain    Home Living Family/patient expects to be discharged to:: Unsure Living Arrangements: Spouse/significant other Available Help at Discharge: Family;Available 24 hours/day   Home Access: Stairs to enter         Additional Comments: Pt and wife just sold their home and are in process of moving to [facility] on 6/6, but are currently transient due to quicker than expected sale of their home    Prior Function Level of Independence: Independent with assistive device(s)         Comments: Pt utilized a SPC for ambulation.  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[employer]   Dominant Hand: Right    Extremity/Trunk Assessment   Upper Extremity Assessment Upper Extremity Assessment: Defer to OT evaluation LUE Deficits / Details: grossly 4-/5 - 4/5 LUE Sensation: decreased light touch (mildly impaired) LUE Coordination: decreased gross motor;decreased fine motor    Lower Extremity Assessment Lower Extremity Assessment: Generalized weakness;RLE deficits/detail;LLE deficits/detail RLE Deficits / Details: MMT scores of the following: hip  flexion 4-, knee extension 5, knee flexion 4+; decreased R hip flexion AROM with hx of surgery RLE Sensation: decreased light touch (decreased accuracy of location of light touch) RLE Coordination: decreased fine motor;decreased gross motor LLE Deficits / Details: MMT scores of the following: hip flexion 4-, knee extension 4+, knee flexion 4 LLE Sensation: decreased light touch (decreased accuracy of location of light touch) LLE Coordination: decreased fine motor;decreased gross motor    Cervical / Trunk Assessment Cervical / Trunk Assessment: Kyphotic  Communication   Communication: No difficulties  Cognition Arousal/Alertness: Awake/alert Behavior During Therapy: WFL for tasks assessed/performed;Flat affect Overall Cognitive Status: Impaired/Different from baseline Area of Impairment: Orientation;Attention;Memory;Following commands;Safety/judgement;Problem solving;Awareness;Rancho level               Rancho Levels of Cognitive Functioning Ranch[place] [employer] Scales of Cognitive Functioning: Confused/appropriate Orientation Level: Disoriented to;Place;Time Current Attention Level: Sustained Memory: Decreased short-term memory Following Commands: Follows one step commands consistently (requires increased time to process info and cues due to decreased attention) Safety/Judgement: Decreased awareness of deficits;Decreased awareness of safety Awareness: Intellectual Problem Solving: Slow processing;Decreased initiation;Difficulty sequencing;Requires verbal cues;Requires tactile cues General Comments: Pt oriented to self and situation.  He thinks it's April 2016.   He intermittently knows that he is in [place] and at [facility] hospital, but in next statement says he's in [place].  He is slow to respond to questions, and slow to initiate. Pt unable to perform basic math skills. Pt with no  awareness into his deficits or safety, stating he thinks he did well and was close to his normal.      General  Comments General comments (skin integrity, edema, etc.): VSS.  Wife present, and with multiple questions re: progression expectations and post acute rehab.  Discussed with her recommendations for post acute rehab and she is in agreement    Exercises     Assessment/Plan    PT Assessment Patient needs continued PT services  PT Problem List Decreased strength;Decreased range of motion;Decreased activity tolerance;Decreased balance;Decreased mobility;Decreased coordination;Decreased cognition;Decreased knowledge of use of DME;Decreased safety awareness;Decreased knowledge of precautions;Impaired sensation       PT Treatment Interventions DME instruction;Gait training;Stair training;Functional mobility training;Therapeutic activities;Therapeutic exercise;Balance training;Neuromuscular re-education;Cognitive remediation;Patient/family education    PT Goals (Current goals can be found in the Care Plan section)  Acute Rehab PT Goals Patient Stated Goal: for pt to go to rehab and to get better PT Goal Formulation: With patient/family Time For Goal Achievement: 08/13/20 Potential to Achieve Goals: Good    Frequency Min 3X/week   Barriers to discharge        Co-evaluation PT/OT/SLP Co-Evaluation/Treatment: Yes Reason for Co-Treatment: Necessary to address cognition/behavior during functional activity;For patient/therapist safety;To address functional/ADL transfers PT goals addressed during session: Mobility/safety with mobility;Balance OT goals addressed during session: ADL's and self-care       AM-PAC PT "6 Clicks" Mobility  Outcome Measure Help needed turning from your back to your side while in a flat bed without using bedrails?: A Lot Help needed moving from lying on your back to sitting on the side of a flat bed without using bedrails?: A Lot Help needed moving to and from a bed to a chair (including a wheelchair)?: A Lot Help needed standing up from a chair using your arms (e.g.,  wheelchair or bedside chair)?: A Lot Help needed to walk in hospital room?: A Lot Help needed climbing 3-5 steps with a railing? : Total 6 Click Score: 11    End of Session Equipment Utilized During Treatment: Gait belt Activity Tolerance: Patient tolerated treatment well Patient left: in chair;with call bell/phone within reach;with chair alarm set Nurse Communication: Mobility status PT Visit Diagnosis: Unsteadiness on feet (R26.81);Other abnormalities of gait and mobility (R26.89);Muscle weakness (generalized) (M62.81);History of falling (Z91.81);Difficulty in walking, not elsewhere classified (R26.2);Other symptoms and signs involving the nervous system (R29.898)    Time: 8657-8469 PT Time Calculation (min) (ACUTE ONLY): 54 min   Charges:   PT Evaluation $PT Eval Moderate Complexity: 1 Mod PT Treatments $Therapeutic Activity: 8-22 mins        Moishe Spice, PT, DPT Acute Rehabilitation Services  Pager: [phone] Office: [phone]   Orvan Falconer 07/30/2020, 12:56 PM

## 2020-07-30 NOTE — Progress Notes (Signed)
Rehab Admissions Coordinator Note:  Patient was screened by Cleatrice Burke for appropriateness for an Inpatient Acute Rehab Consult per OT recs. .  At this time, we are recommending Inpatient Rehab consult. I will place order per protocol.  Cleatrice Burke RN MSN 07/30/2020, 12:09 PM  I can be reached at [phone].

## 2020-07-30 NOTE — Progress Notes (Signed)
Looking good, arouses easily, some conversation, moves all ext, incision cdi, drains removed, will mobilize today, PT/OT, advance diet

## 2020-07-30 NOTE — Progress Notes (Signed)
Pt extubated successfully yesterday. He is stable and neurologically intact. All rest per neurosx.   Ccm will sign off at this time. Recommend consulting [facility] if further medical management is req.   Please contact us with any further questions.

## 2020-07-30 NOTE — Progress Notes (Signed)
Nutrition Follow-up  DOCUMENTATION CODES:   Not applicable  INTERVENTION:   Magic cup TID with meals, each supplement provides 290 kcal and 9 grams of protein   Encourage PO intake    NUTRITION DIAGNOSIS:   Increased nutrient needs related to acute illness as evidenced by estimated needs. Ongoing.   GOAL:   Patient will meet greater than or equal to 90% of their needs Progressing.   MONITOR:   PO intake,Supplement acceptance  REASON FOR ASSESSMENT:   Ventilator    ASSESSMENT:   80 yo male with a PMH of A-fib, diverticulosis, enlarged aorta, prediabetes, and CHF who presents with subdural hematoma.  Pt discussed during ICU rounds and with RN.  Spoke with pt who states he is hungry and wants to eat.  Unable to answer questions about intake PTA. Pt willing to try supplements.   5/25 extubated  Medications reviewed and include: decadron, lasix, senokot  NS with 20 mEq KCl/L @ 75 ml/hr Labs reviewed: glucose: 166  Diet Order:   Diet Order            Diet Heart Room service appropriate? Yes with Assist; Fluid consistency: Thin  Diet effective now                 EDUCATION NEEDS:   No education needs have been identified at this time  Skin:  Skin Assessment: Reviewed RN Assessment  Last BM:  unknown  Height:   Ht Readings from Last 1 Encounters:  07/28/20 5\' 8"  (1.727 m)    Weight:   Wt Readings from Last 1 Encounters:  07/28/20 92.7 kg    Ideal Body Weight:  67.3 kg  BMI:  Body mass index is 31.07 kg/m.  Estimated Nutritional Needs:   Kcal:  2000-2200  Protein:  110-125 grams  Fluid:  >2 L  Glynn Yepes P., RD, LDN, CNSC See AMiON for contact information

## 2020-07-30 NOTE — Consult Note (Signed)
Physical Medicine and Rehabilitation Consult Reason for Consult: Altered mental status Referring Physician: Dr. Sherley Bounds   HPI: [name] [name] is a 80 y.o. right-handed male with history of rheumatic heart disease, atrial fibrillation, diastolic congestive heart failure, aortic valve replacement, mitral valve replacement, dysrhythmia maintained on Tikosyn, prediabetes.  Per chart review patient lives with spouse.  Independent with assistive device.  He is a retired Forensic psychologist.  They have just sold their home and moving to [place] independent living facility on 08/10/2020.  Presented 07/27/2020 with altered mental status after a fall while at church.  Positive loss of consciousness.  Cranial CT scan initially negative and patient decompensated quickly with follow-up showing acute right much larger than left subdural hematomas.  Additional small volume subdural hemorrhage along the falx and tentorium.  Mass-effect including subfalcine herniation with leftward midline shift of 1.8 cm and early trapping of the left lateral ventricle.  Patient underwent right craniotomy for evacuation of subdural hematoma 07/28/2020 per Dr. Sherley Bounds.  He was extubated 07/29/2020.  Decadron protocol as indicated.  Maintained on Keppra for seizure prophylaxis.  Tolerating a regular diet.  Therapy evaluations completed due to patient's decreased functional ability was recommended physical medicine rehab consult.  Wife mentioned pt is a little foggy still, but MUCH better than initially.  Has condom catheter- foley removed yesterday.    Review of Systems  Constitutional: Negative for chills and fever.  HENT: Negative for hearing loss.   Eyes: Negative for blurred vision and double vision.  Respiratory: Negative for cough and shortness of breath.   Cardiovascular: Positive for leg swelling. Negative for chest pain and palpitations.  Gastrointestinal: Positive for constipation. Negative for heartburn, nausea and  vomiting.  Genitourinary: Negative for dysuria, flank pain and hematuria.  Musculoskeletal: Positive for falls.  Skin: Negative for rash.  Neurological: Positive for weakness.  All other systems reviewed and are negative.  Past Medical History:  Diagnosis Date  . Acute rheumatic heart disease, unspecified    childhood, age  8 & 75  . Acute rheumatic pericarditis   . Atrial fibrillation ([place])    history  . CHF (congestive heart failure) ([place])   . Diverticulosis   . Dysrhythmia   . 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 External hemorrhoids without mention of complication   . H/O aortic valve replacement   . H/O mitral valve replacement   . 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 Lesion of ulnar nerve   . 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 Other and unspecified hyperlipidemia   . Pre-diabetes   . Previous back surgery 1978, jan 2007  . Psychosexual dysfunction with inhibited sexual excitement   . SOB (shortness of breath)    "with heavy exercise"  . Stroke ([place]) 08/2013   "I WAS IN AFIB AND THREW A CLOT, THE EFFECTS WERE TRANSITORY AND DIDNT LAST BUT FOR 30 MINUTES"   . Thoracic aortic aneurysm [facility])    Past Surgical History:  Procedure Laterality Date  . AORTIC AND MITRAL VALVE REPLACEMENT     09/2004  . CARDIOVERSION     3 times from 2004-2006  . CARDIOVERSION N/A 09/26/2013   Procedure: CARDIOVERSION;  Surgeon: Larey Dresser, MD;  Location: Home;  Service: Cardiovascular;  Laterality: N/A;  . CARDIOVERSION N/A 06/19/2014   Procedure: CARDIOVERSION;  Surgeon: Jerline Pain, MD;  Location: [place];  Service: Cardiovascular;  Laterality: N/A;  .  CARDIOVERSION N/A 04/24/2017   Procedure: CARDIOVERSION;  Surgeon: Larey Dresser, MD;  Location[facility] ENDOSCOPY;  Service: Cardiovascular;  Laterality: N/A;  . CARDIOVERSION N/A 06/08/2017   Procedure: CARDIOVERSION;  Surgeon: Pixie Casino, MD;  Location: [facility] ENDOSCOPY;  Service: Cardiovascular;   Laterality: N/A;  . CARDIOVERSION N/A 08/24/2017   Procedure: CARDIOVERSION;  Surgeon: Skeet Latch, MD;  Location: [facility] ENDOSCOPY;  Service: Cardiovascular;  Laterality: N/A;  . CARDIOVERSION N/A 02/14/2018   Procedure: CARDIOVERSION;  Surgeon: Larey Dresser, MD;  Location: [facility] ENDOSCOPY;  Service: Cardiovascular;  Laterality: N/A;  . COLONOSCOPY    . CRANIOTOMY N/A 07/28/2020   Procedure: CRANIOTOMY FOR EVACUATION OF SUBDURAL HEMATOMA;  Surgeon: Eustace Wilemon, MD;  Location: [place];  Service: Neurosurgery;  Laterality: N/A;  . laminectomies     10/1975 and in 03/2005  . TONSILLECTOMY     1950  . TOTAL HIP ARTHROPLASTY Right 04/03/2018   Procedure: TOTAL HIP ARTHROPLASTY ANTERIOR APPROACH;  Surgeon: Paralee Cancel, MD;  Location: [facility];  Service: Orthopedics;  Laterality: Right;  70 mins   Family History  Problem Relation Age of Onset  . Macular degeneration Mother        macular degeneration  . Cancer Father        intestinal/GI  . Diabetes Paternal Aunt   . Heart attack Maternal Grandfather   . Diabetes Brother    Social History:  reports that he has never smoked. He has never used smokeless tobacco. He reports that he does not drink alcohol and does not use drugs. Allergies:  Allergies  Allergen Reactions  . Naproxen Hives  . No [employer] Food Allergies Other (See Comments)    Scallops - distress, nausea and vomitting   Medications Prior to Admission  Medication Sig Dispense Refill  . atorvastatin (LIPITOR) 80 MG tablet TAKE 1 TABLET (80 MG TOTAL) BY MOUTH EVERY EVENING. 90 tablet 3  . atorvastatin (LIPITOR) 80 MG tablet Take 1 tablet by mouth daily.    . clobetasol cream (TEMOVATE) 8.25 % Apply 1 application topically daily as needed (for skin irritation).     . Coenzyme Q10 (CO Q-10) 300 MG CAPS Take 300 mg by mouth daily.    [name] COVID-19 mRNA vaccine, Moderna, 100 MCG/0.5ML injection Inject into the muscle. 0.25 mL 0  . Cyanocobalamin (VITAMIN B-12) 5000 MCG SUBL  Place 5,000 mcg under the tongue daily at 2 PM.     . docusate sodium (COLACE) 100 MG capsule Take 100 mg by mouth daily.    [name] dofetilide (TIKOSYN) 500 MCG capsule Take 1 capsule (500 mcg total) by mouth 2 (two) times daily. 180 capsule 3  . dofetilide (TIKOSYN) 500 MCG capsule Take 500 mcg by mouth every 12 (twelve) hours.    . finasteride (PROSCAR) 5 MG tablet Take 5 mg by mouth daily.    . furosemide (LASIX) 20 MG tablet Take 1 tablet (20 mg total) by mouth daily. 90 tablet 3  . Glucosamine-Chondroitin (COSAMIN DS PO) Take 1 tablet by mouth 2 (two) times daily.    [name] loratadine (CLARITIN) 10 MG tablet Take 10 mg by mouth daily as needed for allergies.     [name] losartan (COZAAR) 25 MG tablet Take 1 tablet (25 mg total) by mouth daily. 90 tablet 3  . losartan (COZAAR) 25 MG tablet Take 25 mg by mouth daily.    . metoprolol succinate (TOPROL-XL) 50 MG 24 hr tablet Take 75 mg (1.5 tabs) by mouth twice daily. Take with  or immediately following a meal. 90 tablet 3  . metoprolol succinate (TOPROL-XL) 50 MG 24 hr tablet Take 50 mg by mouth every 12 (twelve) hours.    . Multiple Vitamins-Minerals (MULTIVITAL-M) TABS Take 1 tablet by mouth daily.     . potassium chloride SA (KLOR-CON) 20 MEQ tablet Take 1 tablet (20 mEq total) by mouth daily. 90 tablet 3  . potassium chloride SA (KLOR-CON) 20 MEQ tablet Take 20 mEq by mouth daily.    . Probiotic Product (PROBIOTIC FORMULA PO) Take 1 capsule by mouth 2 (two) times daily.     . tamsulosin (FLOMAX) 0.4 MG CAPS capsule Take 0.4 mg by mouth 2 (two) times daily.    . tamsulosin (FLOMAX) 0.4 MG CAPS capsule Take 0.8 mg by mouth every 12 (twelve) hours.    [name] warfarin (COUMADIN) 5 MG tablet As directed 120 tablet 1  . warfarin (COUMADIN) 5 MG tablet Take 5 mg by mouth daily.      Home: Home Living Family/patient expects to be discharged to:: Unsure Living Arrangements: Spouse/significant other Available Help at Discharge: Family,Available 24 hours/day Home  Access: Stairs to enter Additional Comments: Pt and wife just sold their home and are in process of moving to [facility] on 6/6, but are currently transient due to quicker than expected sale of their home  Functional History: Prior Function Level of Independence: Independent with assistive device(s) Comments: Pt utilized a SPC for ambulation.  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[employer] Status:  Mobility: Bed Mobility Overal bed mobility: Needs Assistance Bed Mobility: Supine to Sit Supine to sit: Mod assist,HOB elevated Transfers Overall transfer level: Needs assistance Equipment used: 2 person hand held assist Transfers: Sit to/from [place] to Stand: Mod assist,+2 physical assistance,+2 safety/equipment Stand pivot transfers: Mod assist,+2 physical assistance,+2 safety/equipment General transfer comment: Pt requires assist for balance as he demonstrates a significant posterior bias      ADL: ADL Overall ADL's : Needs assistance/impaired Eating/Feeding: Set up,Supervision/ safety,Sitting Grooming: Wash/dry hands,Wash/dry face,Minimal assistance,Sitting Grooming Details (indicate cue type and reason): assist for sitting balance Upper Body Bathing: Moderate assistance,Sitting Lower Body Bathing: Maximal assistance,Sit to/from stand Upper Body Dressing : Maximal assistance,Sitting Lower Body Dressing: Maximal assistance,Sit to/from stand Lower Body Dressing Details (indicate cue type and reason): min A to don Lt sock and max A for Rt sock Toilet Transfer: Moderate assistance,+2 for physical assistance,+2 for safety/equipment,Stand-pivot,BSC Toileting- Clothing Manipulation and Hygiene: Total assistance,Sit to/from stand Functional mobility during ADLs: Moderate assistance,+2 for safety/equipment,+2 for physical assistance  Cognition: Cognition Overall Cognitive Status: Impaired/Different from baseline Orientation Level: Oriented to  person,Oriented to place,Oriented to situation,Disoriented to time [employer] Scales of Cognitive Functioning: Confused/appropriate Cognition Arousal/Alertness: Awake/alert Behavior During Therapy: WFL for tasks assessed/performed,Flat affect Overall Cognitive Status: Impaired/Different from baseline Area of Impairment: Orientation,Attention,Memory,Following commands,Safety/judgement,Problem solving,Awareness,[place] level Orientation Level: Disoriented to,Place,Time Current Attention Level: Sustained Memory: Decreased short-term memory Following Commands: Follows one step commands consistently (requires increased time to process info and cues due to decreased attention) Safety/Judgement: Decreased awareness of deficits,Decreased awareness of safety Awareness: Intellectual Problem Solving: Slow processing,Decreased initiation,Difficulty sequencing,Requires verbal cues,Requires tactile cues General Comments: Pt oriented to self and situation.  He thinks it's April 2016.   He intermittently knows that he is in [place] and at [facility] hospital, but in next statement says he's in [place].  He is slow to respond to questions, and slow to initiate.  Blood pressure (!) 142/77, pulse (!) 59, temperature 98.1 F (36.7  C), temperature source Oral, resp. rate (!) 24, height 5\' 8"  (1.727 m), weight 92.7 kg, SpO2 100 %. Physical Exam Vitals and nursing note reviewed. Exam conducted with a chaperone present.  Constitutional:      General: He is not in acute distress.    Comments: Pt is an older gentleman sitting up slightly in bed; wife at bedside- son on phone- confused, but appropriate, NAD  HENT:     Head:     Comments: Craniotomy site is dressed. On R side and L posterior head is covered with dressing from initial fall Staples intact and oozing slightly- drains pulled this AM    Right Ear: External ear normal.     Left Ear: External ear normal.     Nose: Nose normal. No congestion.     Mouth/Throat:      Mouth: Mucous membranes are dry.     Pharynx: Oropharynx is clear. No oropharyngeal exudate.  Eyes:     General:        Right eye: No discharge.        Left eye: No discharge.     Comments: Kept eyes closed- but talking  Cardiovascular:     Rate and Rhythm: Rhythm irregular.     Comments: Borderline bradycardia- irregular rhythm- no JVD Pulmonary:     Comments: CTA B/L- no W/R/R- good air movement  Abdominal:     Comments: Soft, NT, ND, (+)BS    Genitourinary:    Comments: Condom catheter- medium to dark amber urine Musculoskeletal:     Cervical back: Neck supple. No rigidity.     Comments: Strength at least 5-/5 in all extremities, but hard to comply with exam  Skin:    General: Skin is warm and dry.     Comments: On IVFs- R forearm IV- OK  Neurological:     Comments: Patient is a bit somnolent but arousable.  Makes eye contact with examiner.  Follows some simple commands.  Provided name but would fall back to sleep. Kept falling asleep- thought was in [place] at home, but then with logic, figured out in hospital, but still thought was in [place]- finally got him to know where we were- then thought year was 19-2020; also thought was married 20 years, but been married 7 years per wife.  Of note passed bedside swallow and on regular diet- hasn't eaten first meal yet  Psychiatric:     Comments: Sweet, but lethargic     Results for orders placed or performed during the hospital encounter of 07/27/20 (from the past 24 hour(s))  Magnesium     Status: None   Collection Time: 07/30/20  3:02 AM  Result Value Ref Range   Magnesium 2.2 1.7 - 2.4 mg/dL  Basic metabolic panel     Status: Abnormal   Collection Time: 07/30/20  3:02 AM  Result Value Ref Range   Sodium 138 135 - 145 mmol/L   Potassium 3.9 3.5 - 5.1 mmol/L   Chloride 111 98 - 111 mmol/L   CO2 21 (L) 22 - 32 mmol/L   Glucose, Bld 166 (H) 70 - 99 mg/dL   BUN 21 8 - 23 mg/dL   Creatinine, Ser 0.76 0.61 - 1.24 mg/dL    Calcium 8.0 (L) 8.9 - 10.3 mg/dL   GFR, Estimated >60 >60 mL/min   Anion gap 6 5 - 15   CT HEAD WO CONTRAST  Result Date: 07/29/2020 CLINICAL DATA:  Subdural hemorrhage. EXAM: CT HEAD WITHOUT CONTRAST TECHNIQUE: Contiguous  axial images were obtained from the base of the skull through the vertex without intravenous contrast. COMPARISON:  Jul 28, 2020. FINDINGS: Brain: Interval right craniotomy for evacuation of a right subdural hemorrhage with Gel-Foam and small areas of pneumocephalus subjacent to the craniotomy site. Drain is noted, entering at the site of craniotomy and coursing along the right frontal parietal convexity, terminating at the posterior right vertex. Subdural hemorrhage is significantly decreased in size. At the site of craniectomy, extra-axial material measures up to approximately 1 cm; however, this is largely from Gel-Foam with small amount hemorrhage at this site. More anteriorly, subdural hemorrhage measures up to approximately 7 mm in thickness. Mass effect is improved with approximately 5 mm of leftward midline shift at the foramina [place]. Leftward subfalcine herniation is also improved. Basal cisterns are patent. Hyperdense hemorrhage layers along the falx and right tentorial leaflet, not substantially changed. Similar small volume of subarachnoid hemorrhage along the high left frontal parasagittal convexity. Additional site of extra-axial hemorrhage along the left parietotemporal convexity, measuring up to 1 cm in thickness (series 3, image 19) with similar adjacent area of hypodensity. Similar small volume of intraventricular hemorrhage layering in the left occipital horn. No progressive ventriculomegaly. No evidence of interval large vascular territory infarct. Similar punctate nonspecific areas of mineralization in bilateral frontal lobes. Vascular: No hyperdense vessel identified. Calcific atherosclerosis. Skull: Posterior left scalp contusion without acute fracture.  Sinuses/Orbits: Mucosal thickening of the right greater than left maxillary sinuses with frothy secretions in the right maxillary sinus. Other: No mastoid effusions. IMPRESSION: 1. Significantly decreased size of an acute right subdural hemorrhage status post craniotomy and drainage, as detailed above. Improved mass effect with 5 mm of leftward midline shift. 2. Similar subdural hemorrhage along the falx and right tentorial leaflet, small volume high left frontal parasagittal subarachnoid hemorrhage, small volume intraventricular hemorrhage, and additional 1.1 cm thick extra-axial hemorrhage along the left parietotemporal convexity. Electronically Signed   By: Margaretha Sheffield MD   On: 07/29/2020 06:46     Assessment/Plan: Diagnosis: B/L SDHs s/p crani 1. Does the need for close, 24 hr/day medical supervision in concert with the patient's rehab needs make it unreasonable for this patient to be served in a less intensive setting? Yes 2. Co-Morbidities requiring supervision/potential complications: SDHs, afib- not on coumadin currently, CHF, prior CVA, s/p crani; confusion 3. Due to bladder management, bowel management, safety, skin/wound care, disease management, medication administration, pain management and patient education, does the patient require 24 hr/day rehab nursing? Yes 4. Does the patient require coordinated care of a physician, rehab nurse, therapy disciplines of PT, OT, SLP to address physical and functional deficits in the context of the above medical diagnosis(es)? Yes Addressing deficits in the following areas: balance, endurance, locomotion, strength, transferring, bathing, dressing, feeding, grooming, toileting, cognition and language 5. Can the patient actively participate in an intensive therapy program of at least 3 hrs of therapy per day at least 5 days per week? Yes 6. The potential for patient to make measurable gains while on inpatient rehab is good 7. Anticipated functional  outcomes upon discharge from inpatient rehab are supervision and min assist  with PT, supervision and min assist with OT, supervision and min assist with SLP. 8. Estimated rehab length of stay to reach the above functional goals is: 2-3 weeks 9. Anticipated discharge destination: Home 10. Overall Rehab/Functional Prognosis: good  RECOMMENDATIONS: This patient's condition is appropriate for continued rehabilitative care in the following setting: CIR Patient has agreed to participate in  recommended program. Potentially Note that insurance prior authorization may be required for reimbursement for recommended care.  Comment:  1. Pt is an appropriate candidate for inpt rehab-  2. Will d/w admissions coordinators and have insurance sent for approval 3. Of note, still in ICU- will need to know when medically ready since drains were removed this AM 4. Pt needs PT, OT and SLP- still confused, but appropriate   Cathlyn Parsons, PA-C 07/30/2020

## 2020-07-30 NOTE — Evaluation (Signed)
Occupational Therapy Evaluation Patient Details Name: [name] [name] MRN: [id number] DOB: 05/11/1940 Today's Date: 07/30/2020    History of Present Illness This 80 y.o. maled admitted after awakening from nap with AMS.  EMS was activated, however pt decompenstated quickly.  CT of head showed acute Rt much larger than Lt SDH with mass effect and 1.8 cm midline shift with trapping of lateral ventricle.  Pt was decerebrate posturing with non reactive pupils and decsion was made not to proceed with craniotomy. He was intubated with plan for son to arrive from [place].  He improved over next 24 hours with increased command following and responsiveness.  He underwent craniotomy on 5/24 for evacuation of SDH and was extubated  5/25.  He  had sustained a fall the day PTA and hit the back of his head, seen in ED with negative head CT).  PMH includes:  thoracic aortic aneurysm; stroke 2015; h/o MVR, CHF, A-Fib with multiple cardioversions   Clinical Impression   Pt admitted with above. He demonstrates the below listed deficits and will benefit from continued OT to maximize safety and independence with BADLs.  Pt presents to OT with generalized weakness, mild Lt UE weakness, impaired balance, visual and cognitive deficits.  He currently requires min A - max A for ADLs and mod A +2 for functional mobility.  He lives with his wife and was fully independent PTA.   Recommend CIR level rehab.       Follow Up Recommendations  CIR;Supervision/Assistance - 24 hour    Equipment Recommendations  None recommended by OT    Recommendations for Other Services Rehab consult     Precautions / Restrictions Precautions Precautions: Fall Restrictions Weight Bearing Restrictions: No      Mobility Bed Mobility Overal bed mobility: Needs Assistance Bed Mobility: Supine to Sit     Supine to sit: Mod assist;HOB elevated          Transfers Overall transfer level: Needs assistance Equipment used: 2 person hand held  assist Transfers: Sit to/from [employer] Transfers Sit to Stand: Mod assist;+2 physical assistance;+2 safety/equipment Stand pivot transfers: Mod assist;+2 physical assistance;+2 safety/equipment       General transfer comment: Pt requires assist for balance as he demonstrates a significant posterior bias    Balance Overall balance assessment: Needs assistance Sitting-balance support: Feet supported Sitting balance-Leahy Scale: Poor Sitting balance - Comments: Pt requires up to mod A for static sitting.  As he fatigues or is distracted, he leans posteriorly Postural control: Posterior lean Standing balance support: Bilateral upper extremity supported;Single extremity supported;During functional activity Standing balance-Leahy Scale: Poor Standing balance comment: pt requires mod A for static standing.  He did progress to min A by end of session                           ADL either performed or assessed with clinical judgement   ADL Overall ADL's : Needs assistance/impaired Eating/Feeding: Set up;Supervision/ safety;Sitting   Grooming: Wash/dry hands;Wash/dry face;Minimal assistance;Sitting Grooming Details (indicate cue type and reason): assist for sitting balance Upper Body Bathing: Moderate assistance;Sitting   Lower Body Bathing: Maximal assistance;Sit to/from stand   Upper Body Dressing : Maximal assistance;Sitting   Lower Body Dressing: Maximal assistance;Sit to/from stand Lower Body Dressing Details (indicate cue type and reason): min A to don Lt sock and max A for Rt sock Toilet Transfer: Moderate assistance;+2 for physical assistance;+2 for safety/equipment;Stand-pivot;BSC   Toileting- Clothing Manipulation and Hygiene:  Total assistance;Sit to/from stand       Functional mobility during ADLs: Moderate assistance;+2 for safety/equipment;+2 for physical assistance       Vision Baseline Vision/History: Wears glasses Wears Glasses: Reading  only Patient Visual Report: No change from baseline Vision Assessment?: Yes Ocular Range of Motion: Other (comment) Tracking/Visual Pursuits: Impaired - to be further tested in functional context Additional Comments: Pt does not hold gaze fully Lt and Rt.  Mild nystagmus noted with Rt gaze initially.  He does not effectively scan his environment.  He is unable to accurately participate in visual field testing     Perception Perception Perception Tested?: Yes   Praxis Praxis Praxis tested?: Deficits Deficits: Initiation;Perseveration    Pertinent Vitals/Pain Pain Assessment: No/denies pain     Hand Dominance Right   Extremity/Trunk Assessment Upper Extremity Assessment Upper Extremity Assessment: Generalized weakness;LUE deficits/detail LUE Deficits / Details: grossly 4-/5 - 4/5 LUE Sensation: decreased light touch (mildly impaired) LUE Coordination: decreased gross motor;decreased fine motor   Lower Extremity Assessment Lower Extremity Assessment: Defer to PT evaluation   Cervical / Trunk Assessment Cervical / Trunk Assessment: Kyphotic   Communication Communication Communication: No difficulties   Cognition Arousal/Alertness: Awake/alert Behavior During Therapy: WFL for tasks assessed/performed;Flat affect Overall Cognitive Status: Impaired/Different from baseline Area of Impairment: Orientation;Attention;Memory;Following commands;Safety/judgement;Problem solving;Awareness;Rancho level               Rancho Levels of Cognitive Functioning [place] [employer] Scales of Cognitive Functioning: Confused/appropriate Orientation Level: Disoriented to;Place;Time Current Attention Level: Sustained Memory: Decreased short-term memory Following Commands: Follows one step commands consistently (requires increased time to process info and cues due to decreased attention) Safety/Judgement: Decreased awareness of deficits;Decreased awareness of safety Awareness:  Intellectual Problem Solving: Slow processing;Decreased initiation;Difficulty sequencing;Requires verbal cues;Requires tactile cues General Comments: Pt oriented to self and situation.  He thinks it's April 2016.   He intermittently knows that he is in [place] and at [facility] hospital, but in next statement says he's in [place].  He is slow to respond to questions, and slow to initiate.   General Comments  VSS.  Wife present, and with multiple questions re: progression expectations and post acute rehab.  Discussed with her recommendations for post acute rehab and she is in agreement    Exercises     Shoulder Instructions      Home Living Family/patient expects to be discharged to:: Unsure Living Arrangements: Spouse/significant other Available Help at Discharge: Family;Available 24 hours/day   Home Access: Stairs to enter                         Additional Comments: Pt and wife just sold their home and are in process of moving to [place] [facility] on 6/6, but are currently transient due to quicker than expected sale of their home      Prior Functioning/Environment Level of Independence: Independent with assistive device(s)        Comments: Pt utilized a SPC for ambulation.  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[employer], educational Problem List: Decreased strength;Decreased activity tolerance;Impaired balance (sitting and/or standing);Impaired vision/perception;Decreased coordination;Decreased cognition;Decreased safety awareness;Decreased knowledge of use of DME or AE;Decreased knowledge of precautions;Impaired sensation;Impaired UE functional use      OT Treatment/Interventions: Self-care/ADL training;Neuromuscular education;DME and/or AE instruction;Therapeutic activities;Cognitive remediation/compensation;Visual/perceptual remediation/compensation;Patient/family education;Balance training    OT Goals(Current goals can be found in the care plan section) Acute Rehab OT  Goals Patient Stated  Goal: for pt to go to rehab and to get better OT Goal Formulation: With patient/family Time For Goal Achievement: 08/13/20 Potential to Achieve Goals: Good ADL Goals Pt Will Perform Eating: with modified independence;sitting Pt Will Perform Grooming: with min guard assist;standing Pt Will Perform Upper Body Bathing: with set-up;with supervision;sitting Pt Will Perform Lower Body Bathing: with min assist;sit to/from stand Pt Will Perform Upper Body Dressing: with supervision;sitting Pt Will Perform Lower Body Dressing: with min assist;sit to/from stand Pt Will Transfer to Toilet: with min assist;ambulating;regular height toilet;bedside commode;grab bars Pt Will Perform Toileting - Clothing Manipulation and hygiene: with min assist;sit to/from stand Additional ADL Goal #1: Pt will be able to seletively attend to familiar ADL task with min distraction x 5 mins without cues  OT Frequency: Min 2X/week   Barriers to D/C:            Co-evaluation PT/OT/SLP Co-Evaluation/Treatment: Yes Reason for Co-Treatment: Necessary to address cognition/behavior during functional activity;For patient/therapist safety;To address functional/ADL transfers   OT goals addressed during session: ADL's and self-care      AM-PAC OT "6 Clicks" Daily Activity     Outcome Measure Help from another person eating meals?: A Little Help from another person taking care of personal grooming?: A Little Help from another person toileting, which includes using toliet, bedpan, or urinal?: A Lot Help from another person bathing (including washing, rinsing, drying)?: A Lot Help from another person to put on and taking off regular upper body clothing?: A Lot Help from another person to put on and taking off regular lower body clothing?: A Lot 6 Click Score: 14   End of Session Equipment Utilized During Treatment: Gait belt Nurse Communication: Mobility status  Activity Tolerance: Patient tolerated  treatment well Patient left: in chair;with call bell/phone within reach;with chair alarm set;with family/visitor present  OT Visit Diagnosis: Unsteadiness on feet (R26.81);Cognitive communication deficit (R41.841)                Time: 5638-9373 OT Time Calculation (min): 54 min Charges:  OT General Charges $OT Visit: 1 Visit OT Evaluation $OT Eval Moderate Complexity: 1 Mod OT Treatments $Self Care/Home Management : 8-22 mins  Nilsa Nutting., OTR/L Acute Rehabilitation Services Pager [phone] Office [place], [place] 07/30/2020, 11:54 AM

## 2020-07-31 ENCOUNTER — Encounter (HOSPITAL_COMMUNITY): Payer: Self-pay | Admitting: Physical Medicine & Rehabilitation

## 2020-07-31 ENCOUNTER — Other Ambulatory Visit: Payer: Self-pay

## 2020-07-31 ENCOUNTER — Inpatient Hospital Stay (HOSPITAL_COMMUNITY)
Admission: RE | Admit: 2020-07-31 | Discharge: 2020-08-07 | DRG: 025 | Disposition: A | Discharge status: Other Acute Inpatient Hospital | Payer: Medicare Other | Source: Intra-hospital | Attending: Physical Medicine & Rehabilitation | Admitting: Physical Medicine & Rehabilitation

## 2020-07-31 DIAGNOSIS — W19XXXS Unspecified fall, sequela: Secondary | ICD-10-CM | POA: Diagnosis present

## 2020-07-31 DIAGNOSIS — I5032 Chronic diastolic (congestive) heart failure: Secondary | ICD-10-CM | POA: Diagnosis not present

## 2020-07-31 DIAGNOSIS — W19XXXD Unspecified fall, subsequent encounter: Secondary | ICD-10-CM | POA: Diagnosis present

## 2020-07-31 DIAGNOSIS — G8194 Hemiplegia, unspecified affecting left nondominant side: Secondary | ICD-10-CM | POA: Diagnosis not present

## 2020-07-31 DIAGNOSIS — R414 Neurologic neglect syndrome: Secondary | ICD-10-CM | POA: Diagnosis not present

## 2020-07-31 DIAGNOSIS — D72828 Other elevated white blood cell count: Secondary | ICD-10-CM | POA: Diagnosis not present

## 2020-07-31 DIAGNOSIS — R4189 Other symptoms and signs involving cognitive functions and awareness: Secondary | ICD-10-CM | POA: Diagnosis present

## 2020-07-31 DIAGNOSIS — I63311 Cerebral infarction due to thrombosis of right middle cerebral artery: Secondary | ICD-10-CM | POA: Diagnosis not present

## 2020-07-31 DIAGNOSIS — I4891 Unspecified atrial fibrillation: Secondary | ICD-10-CM | POA: Diagnosis present

## 2020-07-31 DIAGNOSIS — I6601 Occlusion and stenosis of right middle cerebral artery: Secondary | ICD-10-CM | POA: Diagnosis present

## 2020-07-31 DIAGNOSIS — I110 Hypertensive heart disease with heart failure: Secondary | ICD-10-CM | POA: Diagnosis present

## 2020-07-31 DIAGNOSIS — E870 Hyperosmolality and hypernatremia: Secondary | ICD-10-CM | POA: Diagnosis not present

## 2020-07-31 DIAGNOSIS — N138 Other obstructive and reflux uropathy: Secondary | ICD-10-CM | POA: Diagnosis present

## 2020-07-31 DIAGNOSIS — Z7901 Long term (current) use of anticoagulants: Secondary | ICD-10-CM | POA: Diagnosis not present

## 2020-07-31 DIAGNOSIS — Z888 Allergy status to other drugs, medicaments and biological substances status: Secondary | ICD-10-CM | POA: Diagnosis not present

## 2020-07-31 DIAGNOSIS — Z952 Presence of prosthetic heart valve: Secondary | ICD-10-CM

## 2020-07-31 DIAGNOSIS — Z96641 Presence of right artificial hip joint: Secondary | ICD-10-CM | POA: Diagnosis present

## 2020-07-31 DIAGNOSIS — N400 Enlarged prostate without lower urinary tract symptoms: Secondary | ICD-10-CM | POA: Diagnosis present

## 2020-07-31 DIAGNOSIS — S065XAA Traumatic subdural hemorrhage with loss of consciousness status unknown, initial encounter: Secondary | ICD-10-CM | POA: Diagnosis present

## 2020-07-31 DIAGNOSIS — G935 Compression of brain: Secondary | ICD-10-CM | POA: Diagnosis present

## 2020-07-31 DIAGNOSIS — Z833 Family history of diabetes mellitus: Secondary | ICD-10-CM | POA: Diagnosis not present

## 2020-07-31 DIAGNOSIS — I714 Abdominal aortic aneurysm, without rupture: Secondary | ICD-10-CM | POA: Diagnosis present

## 2020-07-31 DIAGNOSIS — G936 Cerebral edema: Secondary | ICD-10-CM | POA: Diagnosis not present

## 2020-07-31 DIAGNOSIS — Y92239 Unspecified place in hospital as the place of occurrence of the external cause: Secondary | ICD-10-CM | POA: Diagnosis present

## 2020-07-31 DIAGNOSIS — Z79899 Other long term (current) drug therapy: Secondary | ICD-10-CM | POA: Diagnosis not present

## 2020-07-31 DIAGNOSIS — N401 Enlarged prostate with lower urinary tract symptoms: Secondary | ICD-10-CM | POA: Diagnosis present

## 2020-07-31 DIAGNOSIS — I484 Atypical atrial flutter: Secondary | ICD-10-CM | POA: Diagnosis not present

## 2020-07-31 DIAGNOSIS — Z20822 Contact with and (suspected) exposure to covid-19: Secondary | ICD-10-CM | POA: Diagnosis not present

## 2020-07-31 DIAGNOSIS — D62 Acute posthemorrhagic anemia: Secondary | ICD-10-CM

## 2020-07-31 DIAGNOSIS — S0001XD Abrasion of scalp, subsequent encounter: Secondary | ICD-10-CM

## 2020-07-31 DIAGNOSIS — T380X5A Adverse effect of glucocorticoids and synthetic analogues, initial encounter: Secondary | ICD-10-CM | POA: Diagnosis present

## 2020-07-31 DIAGNOSIS — R7303 Prediabetes: Secondary | ICD-10-CM | POA: Diagnosis present

## 2020-07-31 DIAGNOSIS — Z91013 Allergy to seafood: Secondary | ICD-10-CM

## 2020-07-31 DIAGNOSIS — S065X0D Traumatic subdural hemorrhage without loss of consciousness, subsequent encounter: Secondary | ICD-10-CM | POA: Diagnosis not present

## 2020-07-31 DIAGNOSIS — I4892 Unspecified atrial flutter: Secondary | ICD-10-CM | POA: Diagnosis present

## 2020-07-31 DIAGNOSIS — Z8673 Personal history of transient ischemic attack (TIA), and cerebral infarction without residual deficits: Secondary | ICD-10-CM

## 2020-07-31 DIAGNOSIS — S065X3S Traumatic subdural hemorrhage with loss of consciousness of 1 hour to 5 hours 59 minutes, sequela: Secondary | ICD-10-CM | POA: Diagnosis not present

## 2020-07-31 DIAGNOSIS — I63031 Cerebral infarction due to thrombosis of right carotid artery: Secondary | ICD-10-CM | POA: Diagnosis not present

## 2020-07-31 DIAGNOSIS — I6200 Nontraumatic subdural hemorrhage, unspecified: Secondary | ICD-10-CM | POA: Diagnosis not present

## 2020-07-31 DIAGNOSIS — R400 Somnolence: Secondary | ICD-10-CM | POA: Diagnosis not present

## 2020-07-31 DIAGNOSIS — I712 Thoracic aortic aneurysm, without rupture: Secondary | ICD-10-CM | POA: Diagnosis present

## 2020-07-31 DIAGNOSIS — I639 Cerebral infarction, unspecified: Secondary | ICD-10-CM

## 2020-07-31 DIAGNOSIS — E7849 Other hyperlipidemia: Secondary | ICD-10-CM | POA: Diagnosis present

## 2020-07-31 DIAGNOSIS — S06310A Contusion and laceration of right cerebrum without loss of consciousness, initial encounter: Secondary | ICD-10-CM | POA: Diagnosis not present

## 2020-07-31 DIAGNOSIS — Z8249 Family history of ischemic heart disease and other diseases of the circulatory system: Secondary | ICD-10-CM

## 2020-07-31 DIAGNOSIS — I69354 Hemiplegia and hemiparesis following cerebral infarction affecting left non-dominant side: Secondary | ICD-10-CM | POA: Diagnosis not present

## 2020-07-31 DIAGNOSIS — B961 Klebsiella pneumoniae [K. pneumoniae] as the cause of diseases classified elsewhere: Secondary | ICD-10-CM | POA: Diagnosis not present

## 2020-07-31 DIAGNOSIS — I10 Essential (primary) hypertension: Secondary | ICD-10-CM | POA: Diagnosis not present

## 2020-07-31 DIAGNOSIS — S065X9S Traumatic subdural hemorrhage with loss of consciousness of unspecified duration, sequela: Principal | ICD-10-CM

## 2020-07-31 DIAGNOSIS — S065X9A Traumatic subdural hemorrhage with loss of consciousness of unspecified duration, initial encounter: Secondary | ICD-10-CM | POA: Diagnosis present

## 2020-07-31 DIAGNOSIS — I618 Other nontraumatic intracerebral hemorrhage: Secondary | ICD-10-CM | POA: Diagnosis not present

## 2020-07-31 DIAGNOSIS — S065X2S Traumatic subdural hemorrhage with loss of consciousness of 31 minutes to 59 minutes, sequela: Secondary | ICD-10-CM | POA: Diagnosis not present

## 2020-07-31 DIAGNOSIS — G3184 Mild cognitive impairment, so stated: Secondary | ICD-10-CM | POA: Diagnosis present

## 2020-07-31 DIAGNOSIS — D72829 Elevated white blood cell count, unspecified: Secondary | ICD-10-CM

## 2020-07-31 DIAGNOSIS — E876 Hypokalemia: Secondary | ICD-10-CM | POA: Diagnosis not present

## 2020-07-31 LAB — BASIC METABOLIC PANEL
Anion gap: 5 (ref 5–15)
BUN: 29 mg/dL — ABNORMAL HIGH (ref 8–23)
CO2: 22 mmol/L (ref 22–32)
Calcium: 8.4 mg/dL — ABNORMAL LOW (ref 8.9–10.3)
Chloride: 112 mmol/L — ABNORMAL HIGH (ref 98–111)
Creatinine, Ser: 0.74 mg/dL (ref 0.61–1.24)
GFR, Estimated: >60 mL/min (ref >60)
Glucose, Bld: 141 mg/dL — ABNORMAL HIGH (ref 70–99)
Potassium: 4.1 mmol/L (ref 3.5–5.1)
Sodium: 139 mmol/L (ref 135–145)

## 2020-07-31 LAB — GLUCOSE, CAPILLARY
Glucose-Capillary: 218 mg/dL — ABNORMAL HIGH (ref 70–99)
Glucose-Capillary: 188 mg/dL — ABNORMAL HIGH (ref 70–99)

## 2020-07-31 LAB — TRIGLYCERIDES: Triglycerides: 70 mg/dL (ref <150)

## 2020-07-31 LAB — MAGNESIUM: Magnesium: 2.4 mg/dL (ref 1.7–2.4)

## 2020-07-31 MED ORDER — DOFETILIDE 500 MCG PO CAPS
500.0000 ug | ORAL_CAPSULE | Freq: Two times a day (BID) | ORAL | Status: DC
  Administered 2020-07-31 – 2020-08-06 (×13): 500 ug via ORAL
  Filled 2020-07-31 (×14): qty 1

## 2020-07-31 MED ORDER — EXERCISE FOR HEART AND HEALTH BOOK
Freq: Once | Status: DC
  Filled 2020-07-31: qty 1

## 2020-07-31 MED ORDER — POLYETHYLENE GLYCOL 3350 17 G PO PACK
17.0000 g | PACK | Freq: Every day | ORAL | Status: DC | PRN
Start: 2020-07-31 — End: 2020-08-07

## 2020-07-31 MED ORDER — METOPROLOL TARTRATE 50 MG PO TABS
50.0000 mg | ORAL_TABLET | Freq: Two times a day (BID) | ORAL | Status: DC
  Administered 2020-07-31 – 2020-08-06 (×13): 50 mg via ORAL
  Filled 2020-07-31 (×14): qty 1

## 2020-07-31 MED ORDER — PROCHLORPERAZINE 25 MG RE SUPP: 12.5000 mg | Freq: Four times a day (QID) | RECTAL | Status: DC | PRN

## 2020-07-31 MED ORDER — ALUM & MAG HYDROXIDE-SIMETH 200-200-20 MG/5ML PO SUSP: 30.0000 mL | ORAL | Status: DC | PRN

## 2020-07-31 MED ORDER — LEVETIRACETAM 500 MG PO TABS
500.0000 mg | ORAL_TABLET | Freq: Two times a day (BID) | ORAL | Status: DC
  Administered 2020-07-31 – 2020-08-06 (×13): 500 mg via ORAL
  Filled 2020-07-31 (×14): qty 1

## 2020-07-31 MED ORDER — BISACODYL 10 MG RE SUPP
10.0000 mg | Freq: Every day | RECTAL | Status: DC | PRN
  Administered 2020-08-03: 10 mg via RECTAL
  Filled 2020-07-31: qty 1

## 2020-07-31 MED ORDER — LIVING BETTER WITH HEART FAILURE BOOK: Freq: Once | Status: DC

## 2020-07-31 MED ORDER — LORATADINE 10 MG PO TABS
10.0000 mg | ORAL_TABLET | Freq: Every day | ORAL | Status: DC
  Administered 2020-07-31 – 2020-08-06 (×7): 10 mg via ORAL
  Filled 2020-07-31 (×8): qty 1

## 2020-07-31 MED ORDER — HYDRALAZINE HCL 20 MG/ML IJ SOLN
5.0000 mg | Freq: Once | INTRAMUSCULAR | Status: AC
  Administered 2020-07-31: 5 mg via INTRAVENOUS
  Filled 2020-07-31: qty 1

## 2020-07-31 MED ORDER — RISAQUAD PO CAPS
1.0000 | ORAL_CAPSULE | Freq: Two times a day (BID) | ORAL | Status: DC
  Administered 2020-07-31 – 2020-08-06 (×12): 1 via ORAL
  Filled 2020-07-31 (×15): qty 1

## 2020-07-31 MED ORDER — GUAIFENESIN-DM 100-10 MG/5ML PO SYRP: 5.0000 mL | ORAL_SOLUTION | Freq: Four times a day (QID) | ORAL | Status: DC | PRN

## 2020-07-31 MED ORDER — SENNA 8.6 MG PO TABS
1.0000 | ORAL_TABLET | Freq: Two times a day (BID) | ORAL | Status: DC
  Administered 2020-07-31 – 2020-08-06 (×11): 8.6 mg via ORAL
  Filled 2020-07-31 (×13): qty 1

## 2020-07-31 MED ORDER — INSULIN ASPART 100 UNIT/ML IJ SOLN
0.0000 [IU] | Freq: Every day | INTRAMUSCULAR | Status: DC
  Administered 2020-08-03 – 2020-08-05 (×2): 2 [IU] via SUBCUTANEOUS

## 2020-07-31 MED ORDER — LIVING WELL WITH DIABETES BOOK
Freq: Once | Status: AC
  Filled 2020-07-31: qty 1

## 2020-07-31 MED ORDER — ADULT MULTIVITAMIN W/MINERALS CH
1.0000 | ORAL_TABLET | Freq: Every day | ORAL | Status: DC
  Administered 2020-08-01 – 2020-08-06 (×6): 1 via ORAL
  Filled 2020-07-31 (×7): qty 1

## 2020-07-31 MED ORDER — FUROSEMIDE 20 MG PO TABS
20.0000 mg | ORAL_TABLET | Freq: Every day | ORAL | Status: DC
  Administered 2020-08-01 – 2020-08-06 (×6): 20 mg via ORAL
  Filled 2020-07-31 (×7): qty 1

## 2020-07-31 MED ORDER — TAMSULOSIN HCL 0.4 MG PO CAPS
0.8000 mg | ORAL_CAPSULE | Freq: Every day | ORAL | Status: DC
  Administered 2020-07-31 – 2020-08-06 (×7): 0.8 mg via ORAL
  Filled 2020-07-31 (×7): qty 2

## 2020-07-31 MED ORDER — HYDROCODONE-ACETAMINOPHEN 5-325 MG PO TABS: 1.0000 | ORAL_TABLET | ORAL | Status: DC | PRN

## 2020-07-31 MED ORDER — FLEET ENEMA 7-19 GM/118ML RE ENEM
1.0000 | ENEMA | Freq: Once | RECTAL | Status: DC | PRN
Start: 2020-07-31 — End: 2020-08-07

## 2020-07-31 MED ORDER — LOSARTAN POTASSIUM 50 MG PO TABS
25.0000 mg | ORAL_TABLET | Freq: Every day | ORAL | Status: DC
  Administered 2020-08-01 – 2020-08-06 (×6): 25 mg via ORAL
  Filled 2020-07-31 (×7): qty 1

## 2020-07-31 MED ORDER — HYDRALAZINE HCL 10 MG PO TABS
10.0000 mg | ORAL_TABLET | Freq: Four times a day (QID) | ORAL | Status: DC | PRN
  Administered 2020-08-01: 10 mg via ORAL
  Filled 2020-07-31: qty 1

## 2020-07-31 MED ORDER — INSULIN ASPART 100 UNIT/ML IJ SOLN
0.0000 [IU] | Freq: Three times a day (TID) | INTRAMUSCULAR | Status: DC
  Administered 2020-07-31: 3 [IU] via SUBCUTANEOUS
  Administered 2020-08-01 – 2020-08-02 (×4): 1 [IU] via SUBCUTANEOUS
  Administered 2020-08-02: 2 [IU] via SUBCUTANEOUS
  Administered 2020-08-02: 1 [IU] via SUBCUTANEOUS
  Administered 2020-08-03: 2 [IU] via SUBCUTANEOUS
  Administered 2020-08-03: 1 [IU] via SUBCUTANEOUS
  Administered 2020-08-04 (×2): 2 [IU] via SUBCUTANEOUS
  Administered 2020-08-04: 3 [IU] via SUBCUTANEOUS
  Administered 2020-08-05 (×2): 1 [IU] via SUBCUTANEOUS
  Administered 2020-08-06: 2 [IU] via SUBCUTANEOUS
  Administered 2020-08-07: 1 [IU] via SUBCUTANEOUS

## 2020-07-31 MED ORDER — TRAZODONE HCL 50 MG PO TABS
25.0000 mg | ORAL_TABLET | Freq: Every evening | ORAL | Status: DC | PRN
  Administered 2020-08-01 – 2020-08-06 (×6): 50 mg via ORAL
  Filled 2020-07-31 (×6): qty 1

## 2020-07-31 MED ORDER — DIPHENHYDRAMINE HCL 12.5 MG/5ML PO ELIX: 12.5000 mg | ORAL_SOLUTION | Freq: Four times a day (QID) | ORAL | Status: DC | PRN

## 2020-07-31 MED ORDER — POTASSIUM CHLORIDE CRYS ER 20 MEQ PO TBCR
20.0000 meq | EXTENDED_RELEASE_TABLET | Freq: Every day | ORAL | Status: DC
  Administered 2020-08-01 – 2020-08-06 (×6): 20 meq via ORAL
  Filled 2020-07-31 (×7): qty 1

## 2020-07-31 MED ORDER — FINASTERIDE 5 MG PO TABS
5.0000 mg | ORAL_TABLET | Freq: Every day | ORAL | Status: DC
  Administered 2020-08-01 – 2020-08-06 (×6): 5 mg via ORAL
  Filled 2020-07-31 (×8): qty 1

## 2020-07-31 MED ORDER — DEXAMETHASONE SODIUM PHOSPHATE 4 MG/ML IJ SOLN
4.0000 mg | Freq: Three times a day (TID) | INTRAMUSCULAR | Status: DC
  Administered 2020-07-31 – 2020-08-02 (×5): 4 mg via INTRAVENOUS
  Filled 2020-07-31 (×5): qty 1

## 2020-07-31 MED ORDER — ACETAMINOPHEN 325 MG PO TABS
325.0000 mg | ORAL_TABLET | ORAL | Status: DC | PRN
  Administered 2020-08-03 – 2020-08-04 (×2): 650 mg via ORAL
  Filled 2020-07-31 (×2): qty 2

## 2020-07-31 MED ORDER — VITAMIN B-12 1000 MCG PO TABS
5000.0000 ug | ORAL_TABLET | Freq: Every day | ORAL | Status: DC
  Administered 2020-08-01 – 2020-08-06 (×6): 5000 ug via ORAL
  Filled 2020-07-31 (×7): qty 5

## 2020-07-31 MED ORDER — PROCHLORPERAZINE MALEATE 5 MG PO TABS: 5.0000 mg | ORAL_TABLET | Freq: Four times a day (QID) | ORAL | Status: DC | PRN

## 2020-07-31 MED ORDER — PROCHLORPERAZINE EDISYLATE 10 MG/2ML IJ SOLN: 5.0000 mg | Freq: Four times a day (QID) | INTRAMUSCULAR | Status: DC | PRN

## 2020-07-31 MED ORDER — LIDOCAINE HCL URETHRAL/MUCOSAL 2 % EX GEL: CUTANEOUS | Status: DC | PRN

## 2020-07-31 NOTE — Progress Notes (Signed)
Patient arrived on unit and made comfortable. Patient and family educated to unit routines. Patient verbalized understanding that he needs to call for assistance and not get out of bed on his own, but he may not be able to remember, bed alarm on. Call bell in reach, resting with family at bedside.

## 2020-07-31 NOTE — H&P (Signed)
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 Of note, patient had sustained a fall at church the day before where he had LOC, struck the back of his head, was evaluated in ED with negative CT head.  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  He was intubated for airway protection, Coumadin reversed and  NS consulted for input. Patient had decrebrate posturing with unequal and unreactive pupils and minimal gag reflex. Family elected DNR with comfort care due to poor prognosis but on 05/24, patient was showing improvement with ability to follow some commands and repeat CT head showing persistent bilateral SDH with slightly smaller size and lesser degree of shift.  [name]    He was taken to OR on 05/24 for right craniotomy for evacuation of R-SDH by Dr. Ronnald Ramp. Pharmacy was consulted to manage Tikosyn dose/electrolyte abnormalities--daily EKG X 5 days recommended.   He tolerated extubation without difficulty the next day and therapy evaluations initiated.  Patient noted to be limited by strong posterior bias, visual deficits with difficulty scanning environment, mild sensory deficits, left>right sided weakness as well as poor safety awareness. CIR recommended due to functional deficits.      Review of Systems  Constitutional: Negative for chills and fever.  HENT: Negative for hearing loss.   Eyes: Positive for double vision.  Respiratory: Positive for cough and sputum production.   Cardiovascular: Negative for chest pain and  palpitations.  Gastrointestinal: Negative for abdominal pain, nausea and vomiting.  Genitourinary: Negative for dysuria.  Musculoskeletal: Positive for falls and neck pain.  Skin: Negative for rash.  Neurological: Positive for dizziness, focal weakness and headaches.  Endo/Heme/Allergies: Bruises/bleeds easily.  Psychiatric/Behavioral: Negative for depression and suicidal ideas.        Past Medical History:  Diagnosis Date  . Acute rheumatic heart disease, unspecified      childhood, age  55 & 41  . Acute rheumatic pericarditis    . Atrial fibrillation ([place])      history  . CHF (congestive heart failure) ([place])    . Diverticulosis    . Dysrhythmia    . 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 External hemorrhoids without mention of complication    . H/O aortic valve replacement    . H/O mitral valve replacement    . 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 Lesion of ulnar nerve    . 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 Other and unspecified hyperlipidemia    . Pre-diabetes    . Previous back surgery 1978, jan 2007  . Psychosexual dysfunction with inhibited sexual excitement    . SOB (shortness of breath)      "with heavy exercise"  . Stroke ([place]) 08/2013    "I WAS IN AFIB AND THREW A CLOT, THE EFFECTS WERE TRANSITORY AND DIDNT LAST BUT FOR 30 MINUTES"   . Thoracic aortic aneurysm ([place])  Past Surgical History:  Procedure Laterality Date  . AORTIC AND MITRAL VALVE REPLACEMENT        09/2004  . CARDIOVERSION        3 times from 2004-2006  . CARDIOVERSION N/A 09/26/2013    Procedure: CARDIOVERSION;  Surgeon: Larey Dresser, MD;  Location: [place];  Service: Cardiovascular;  Laterality: N/A;  . CARDIOVERSION N/A 06/19/2014    Procedure: CARDIOVERSION;  Surgeon: Jerline Pain, MD;  Location: [place];  Service: Cardiovascular;  Laterality: N/A;  . CARDIOVERSION N/A 04/24/2017    Procedure: CARDIOVERSION;  Surgeon: Larey Dresser, MD;   Location: [facility] ENDOSCOPY;  Service: Cardiovascular;  Laterality: N/A;  . CARDIOVERSION N/A 06/08/2017    Procedure: CARDIOVERSION;  Surgeon: Pixie Casino, MD;  Location: [facility] ENDOSCOPY;  Service: Cardiovascular;  Laterality: N/A;  . CARDIOVERSION N/A 08/24/2017    Procedure: CARDIOVERSION;  Surgeon: Skeet Latch, MD;  Location: [facility] ENDOSCOPY;  Service: Cardiovascular;  Laterality: N/A;  . CARDIOVERSION N/A 02/14/2018    Procedure: CARDIOVERSION;  Surgeon: Larey Dresser, MD;  Location: [facility] ENDOSCOPY;  Service: Cardiovascular;  Laterality: N/A;  . COLONOSCOPY      . CRANIOTOMY N/A 07/28/2020    Procedure: CRANIOTOMY FOR EVACUATION OF SUBDURAL HEMATOMA;  Surgeon: Eustace Chuba, MD;  Location: [place];  Service: Neurosurgery;  Laterality: N/A;  . laminectomies        10/1975 and in 03/2005  . TONSILLECTOMY        1950  . TOTAL HIP ARTHROPLASTY Right 04/03/2018    Procedure: TOTAL HIP ARTHROPLASTY ANTERIOR APPROACH;  Surgeon: Paralee Cancel, MD;  Location: [facility];  Service: Orthopedics;  Laterality: Right;  70 mins           Family History  Problem Relation Age of Onset  . Macular degeneration Mother          macular degeneration  . Cancer Father          intestinal/GI  . Diabetes Paternal Aunt    . Heart attack Maternal Grandfather    . Diabetes Brother        Social History:  Married. 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 Active and in process of moving to [facility] on 06/06.  Per reports that he has never smoked. He has never used smokeless tobacco. Per reports uses alcohol and does not use drugs.          Allergies  Allergen Reactions  . Naproxen Hives  . No [employer] Food Allergies Other (See Comments)      Scallops - distress, nausea and vomitting            Medications Prior to Admission  Medication Sig Dispense Refill  . atorvastatin (LIPITOR) 80 MG tablet TAKE 1 TABLET (80 MG TOTAL) BY MOUTH EVERY EVENING. 90 tablet 3  . atorvastatin (LIPITOR) 80 MG tablet Take 1  tablet by mouth daily.      . clobetasol cream (TEMOVATE) 5.63 % Apply 1 application topically daily as needed (for skin irritation).       . Coenzyme Q10 (CO Q-10) 300 MG CAPS Take 300 mg by mouth daily.      [name] COVID-19 mRNA vaccine, Moderna, 100 MCG/0.5ML injection Inject into the muscle. 0.25 mL 0  . Cyanocobalamin (VITAMIN B-12) 5000 MCG SUBL Place 5,000 mcg under the tongue daily at 2 PM.       . docusate sodium (COLACE) 100 MG capsule Take 100 mg by mouth daily.      [name]  dofetilide (TIKOSYN) 500 MCG capsule Take 1 capsule (500 mcg total) by mouth 2 (two) times daily. 180 capsule 3  . dofetilide (TIKOSYN) 500 MCG capsule Take 500 mcg by mouth every 12 (twelve) hours.      . finasteride (PROSCAR) 5 MG tablet Take 5 mg by mouth daily.      . furosemide (LASIX) 20 MG tablet Take 1 tablet (20 mg total) by mouth daily. 90 tablet 3  . Glucosamine-Chondroitin (COSAMIN DS PO) Take 1 tablet by mouth 2 (two) times daily.      [name] loratadine (CLARITIN) 10 MG tablet Take 10 mg by mouth daily as needed for allergies.       [name] losartan (COZAAR) 25 MG tablet Take 1 tablet (25 mg total) by mouth daily. 90 tablet 3  . losartan (COZAAR) 25 MG tablet Take 25 mg by mouth daily.      . metoprolol succinate (TOPROL-XL) 50 MG 24 hr tablet Take 75 mg (1.5 tabs) by mouth twice daily. Take with or immediately following a meal. 90 tablet 3  . metoprolol succinate (TOPROL-XL) 50 MG 24 hr tablet Take 50 mg by mouth every 12 (twelve) hours.      . Multiple Vitamins-Minerals (MULTIVITAL-M) TABS Take 1 tablet by mouth daily.       . potassium chloride SA (KLOR-CON) 20 MEQ tablet Take 1 tablet (20 mEq total) by mouth daily. 90 tablet 3  . potassium chloride SA (KLOR-CON) 20 MEQ tablet Take 20 mEq by mouth daily.      . Probiotic Product (PROBIOTIC FORMULA PO) Take 1 capsule by mouth 2 (two) times daily.       . tamsulosin (FLOMAX) 0.4 MG CAPS capsule Take 0.4 mg by mouth 2 (two) times daily.      . tamsulosin (FLOMAX) 0.4 MG  CAPS capsule Take 0.8 mg by mouth every 12 (twelve) hours.      [name] warfarin (COUMADIN) 5 MG tablet As directed 120 tablet 1  . warfarin (COUMADIN) 5 MG tablet Take 5 mg by mouth daily.          Drug Regimen Review  Drug regimen was reviewed and remains appropriate with no significant issues identified   Home: Home Living Family/patient expects to be discharged to:: Unsure Living Arrangements: Spouse/significant other Available Help at Discharge: Family,Available 24 hours/day Home Access: Stairs to enter Additional Comments: Pt and wife just sold their home and are in process of moving to [facility] on 6/6, but are currently transient due to quicker than expected sale of their home   Functional History: Prior Function Level of Independence: Independent with assistive device(s) Comments: Pt utilized a SPC for ambulation.  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[employer] Status:  Mobility: Bed Mobility Overal bed mobility: Needs Assistance Bed Mobility: Supine to Sit Supine to sit: Mod assist,HOB elevated General bed mobility comments: Pt needing cues to remain on task to transition to sit EOB, extra time. Pt requesting therapist's hand to pull trunk to ascend at EOB. Transfers Overall transfer level: Needs assistance Equipment used: 2 person hand held assist Transfers: Sit to/from [place] to Stand: Mod assist,+2 physical assistance,+2 safety/equipment Stand pivot transfers: Mod assist,+2 physical assistance,+2 safety/equipment General transfer comment: Pt requires assist for balance as he demonstrates a significant posterior bias. ModAx2 with UE support to steady and direct pt with coming to stand and taking side steps to transfer bed > chair, chair > commode, commode > chair. Ambulation/Gait Ambulation/Gait assistance:  Mod assist,+2 physical assistance,+2 safety/equipment Gait Distance (Feet): 15 Feet (x3 bouts of ~15 ft > ~3 ft > ~3  ft) Assistive device: 2 person hand held assist Gait Pattern/deviations: Step-through pattern,Decreased step length - right,Decreased step length - left,Decreased stride length,Decreased weight shift to right,Decreased weight shift to left,Shuffle,Leaning posteriorly,Narrow base of support General Gait Details: Pt with strong posterior bias, needing cues to shift weight anteriorly into toes intermittently. Cues for upright posture. ModAx2 with bil HHA to direct and sequence weight shoft to facilitate steps anteriorly, posteriorly, and laterally. Gait velocity: reduced Gait velocity interpretation: <1.31 ft/sec, indicative of household ambulator   ADL: ADL Overall ADL's : Needs assistance/impaired Eating/Feeding: Set up,Supervision/ safety,Sitting Grooming: Wash/dry hands,Wash/dry face,Minimal assistance,Sitting Grooming Details (indicate cue type and reason): assist for sitting balance Upper Body Bathing: Moderate assistance,Sitting Lower Body Bathing: Maximal assistance,Sit to/from stand Upper Body Dressing : Maximal assistance,Sitting Lower Body Dressing: Maximal assistance,Sit to/from stand Lower Body Dressing Details (indicate cue type and reason): min A to don Lt sock and max A for Rt sock Toilet Transfer: Moderate assistance,+2 for physical assistance,+2 for safety/equipment,Stand-pivot,BSC Toileting- Clothing Manipulation and Hygiene: Total assistance,Sit to/from stand Functional mobility during ADLs: Moderate assistance,+2 for safety/equipment,+2 for physical assistance   Cognition: Cognition Overall Cognitive Status: Impaired/Different from baseline Orientation Level: Oriented to person,Disoriented to place,Disoriented to time,Disoriented to situation [employer] Scales of Cognitive Functioning: Confused/appropriate Cognition Arousal/Alertness: Awake/alert Behavior During Therapy: WFL for tasks assessed/performed,Flat affect Overall Cognitive Status: Impaired/Different  from baseline Area of Impairment: Orientation,Attention,Memory,Following commands,Safety/judgement,Problem solving,Awareness,[place] level Orientation Level: Disoriented to,Place,Time Current Attention Level: Sustained Memory: Decreased short-term memory Following Commands: Follows one step commands consistently (requires increased time to process info and cues due to decreased attention) Safety/Judgement: Decreased awareness of deficits,Decreased awareness of safety Awareness: Intellectual Problem Solving: Slow processing,Decreased initiation,Difficulty sequencing,Requires verbal cues,Requires tactile cues General Comments: Pt oriented to self and situation.  He thinks it's April 2016.   He intermittently knows that he is in [place] and at [facility], but in next statement says he's in [place].  He is slow to respond to questions, and slow to initiate. Pt unable to perform basic math skills. Pt with no awareness into his deficits or safety, stating he thinks he did well and was close to his normal.     Blood pressure (!) 158/83, pulse 61, temperature (!) 97.5 F (36.4 C), temperature source Axillary, resp. rate 19, height 5\' 8"  (1.727 m), weight 92.7 kg, SpO2 98 %. Physical Exam Constitutional:      General: He is not in acute distress. HENT:     Head:     Comments: Right crani site CDI with scab, staples, abrasion on occiput with foam dressing    Nose: Nose normal.     Mouth/Throat:     Mouth: Mucous membranes are moist.  Eyes:     Pupils: Pupils are equal, round, and reactive to light.     Comments: Doesn't track as well to the right  Cardiovascular:     Rate and Rhythm: Regular rhythm. Bradycardia present.     Pulses: Normal pulses.     Heart sounds: No murmur heard. No gallop.   Pulmonary:     Effort: Pulmonary effort is normal. No respiratory distress.     Breath sounds: No wheezing.  Abdominal:     General: Bowel sounds are normal. There is distension.     Palpations: Abdomen is  soft.     Tenderness: There is no abdominal tenderness.  Musculoskeletal:  General: No swelling or tenderness.     Cervical back: Rigidity present.  Skin:    General: Skin is warm.     Comments: Scattered bruises bilateral UE's  Neurological:     Mental Status: He is alert and oriented to person, place, and time.     Comments: Fair insight and awareness. Speech generally clear. Normal language. STM fair. Doesn't track as well to right but no focal CN abnormalities. UE 3+ to 4/5. R>L, decreased [place] LUE, depth perception abnl.[name] BLE 3+ to 4/5 also. No focal sensory abnl.   Psychiatric:        Mood and Affect: Mood normal.        Behavior: Behavior normal.        Lab Results Last 48 Hours        Results for orders placed or performed during the hospital encounter of 07/27/20 (from the past 48 hour(s))  Magnesium     Status: None    Collection Time: 07/30/20  3:02 AM  Result Value Ref Range    Magnesium 2.2 1.7 - 2.4 mg/dL      Comment: Performed at [facility], [place] [address], [place], [place] [address]  Basic metabolic panel     Status: Abnormal    Collection Time: 07/30/20  3:02 AM  Result Value Ref Range    Sodium 138 135 - 145 mmol/L    Potassium 3.9 3.5 - 5.1 mmol/L    Chloride 111 98 - 111 mmol/L    CO2 21 (L) 22 - 32 mmol/L    Glucose, Bld 166 (H) 70 - 99 mg/dL      Comment: Glucose reference range applies only to samples taken after fasting for at least 8 hours.    BUN 21 8 - 23 mg/dL    Creatinine, Ser 0.76 0.61 - 1.24 mg/dL    Calcium 8.0 (L) 8.9 - 10.3 mg/dL    GFR, Estimated >60 >60 mL/min      Comment: (NOTE) Calculated using the CKD-EPI Creatinine Equation (2021)      Anion gap 6 5 - 15      Comment: Performed at [facility] [address], [place], [place] [address]  Triglycerides     Status: None    Collection Time: 07/31/20  4:45 AM  Result Value Ref Range    Triglycerides 70 <150 mg/dL      Comment: Performed at [facility] [address], [place], [place] [address]  Magnesium     Status: None    Collection Time: 07/31/20  4:45 AM  Result Value Ref Range    Magnesium 2.4 1.7 - 2.4 mg/dL      Comment: Performed at [facility] [address] [place], [place] [address]  Basic metabolic panel     Status: Abnormal    Collection Time: 07/31/20  4:45 AM  Result Value Ref Range    Sodium 139 135 - 145 mmol/L    Potassium 4.1 3.5 - 5.1 mmol/L    Chloride 112 (H) 98 - 111 mmol/L    CO2 22 22 - 32 mmol/L    Glucose, Bld 141 (H) 70 - 99 mg/dL      Comment: Glucose reference range applies only to samples taken after fasting for at least 8 hours.    BUN 29 (H) 8 - 23 mg/dL    Creatinine, Ser 0.74 0.61 - 1.24 mg/dL    Calcium 8.4 (L) 8.9 -  10.3 mg/dL    GFR, Estimated >60 >60 mL/min      Comment: (NOTE) Calculated using the CKD-EPI Creatinine Equation (2021)      Anion gap 5 5 - 15      Comment: Performed at [facility], [place] [address] [place], [place] [address]      Imaging Results (Last 48 hours)  No results found.           Medical Problem List and Plan: 1.  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 Pt s/p Right-sided craniotomy 5/24             -patient may shower             -ELOS/Goals: 18-21 days, supervision to min assist goals with PT, OT, SLP 2.  Antithrombotics: -DVT/anticoagulation:  Mechanical: Sequential compression devices, below knee Bilateral lower extremities-pt to resume coumadin at some point over the next week or so. Will f/u with NS next week.             -antiplatelet therapy: N/A 3. Pain Management: Hydrocodone prn.  4. Mood: LCSW to follow for evaluation and support.              -antipsychotic agents: NA 5. Neuropsych: This patient is not capable of making decisions on his own behalf. 6. Skin/Wound Care: Routine pressure relief measures.  7. Fluids/Electrolytes/Nutrition: Monitor I/O. Check lytes in am; 8. Atypical Atrial  flutter: On Toprol, Tikosyn and coumadin PTA--coumadin on hold at present             --monitor HR tid. Need to Keep K+>4.0 and Mg.1.8.             -monitor for syncopal episodes, symptomatic arhythmia              -cards f/u as needed 9. 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              --continue Lasix, Cozaar, metoprolol and prn hydralazine.   10. BPH: Resume Flomax and Proscar.  11. Diastolic CHF: Monitor weights daily and for any signs of overload.              --continue Lasix, hydralazine and Lipitor.  12. Pre-diabetes: Hgb A1c-6.3 07/2019. Will monitor BS ac/hs while on steroids and use SSI for elevated BS.              --Add dietary restrictions.  13. Acute blood loss anemia: Recheck CBC in am.              Bary Leriche, PA-C 07/31/2020   I have personally performed a face to face diagnostic evaluation of this patient and formulated the key components of the plan.  Additionally, I have personally reviewed laboratory data, imaging studies, as well as relevant notes and concur with the physician assistant's documentation above.  The patient's status has not changed from the original H&P.  Any changes in documentation from the acute care chart have been noted above.  Meredith Staggers, MD, Mellody Drown

## 2020-07-31 NOTE — Progress Notes (Signed)
Inpatient Rehab Admissions Coordinator:   I met with pt and his spouse at bedside.  Pt is alert and following conversation, though he does fatigue easily and drifts off throughout some of our conversation.  We discussed goals of supervision to min assist with estimated length of stay 18-21 days, dependent upon progress.  Pt would be an excellent candidate for CIR and we do have a bed available to admit him today.  Awaiting final confirmation from neurosurgery team that pt is ready.   Shann Medal, PT, DPT Admissions Coordinator [phone] 07/31/20  10:39 AM

## 2020-07-31 NOTE — Discharge Summary (Signed)
Physician Discharge Summary  Patient ID: [name] [name] MRN: [id number] DOB/AGE: [id number] y.o.  Admit date: 07/27/2020 Discharge date: 07/31/2020  Admission Diagnoses: right subdural hematoma   Discharge Diagnoses: same   Discharged Condition: good  Hospital Course: The patient was admitted on 07/27/2020 and taken to the ICU for comfort care measures. on 5/24 that patient had a significant improvement in his neurologic function so he was then taken to operating room on 07/28/2020 where the patient underwent a right craniotomy for subdural hematoma evacuation. The patient tolerated the procedure well and was taken to the recovery room and then to the ICU in stable condition. The hospital course was routine. There were no complications. The wound remained clean dry and intact. Pt had appropriate head soreness. No complaints of new pain or new N/T/W. The patient remained afebrile with stable vital signs, and tolerated a regular diet. The patient continued to increase activities, and pain was well controlled with oral pain medications.   Consults: pulmonary/intensive care  Significant Diagnostic Studies:  Results for orders placed or performed during the hospital encounter of 07/27/20  Resp Panel by RT-PCR (Flu A&B, Covid) Nasopharyngeal Swab   Specimen: Nasopharyngeal Swab; Nasopharyngeal(NP) swabs in vial transport medium  Result Value Ref Range   SARS Coronavirus 2 by RT PCR NEGATIVE NEGATIVE   Influenza A by PCR NEGATIVE NEGATIVE   Influenza B by PCR NEGATIVE NEGATIVE  MRSA PCR Screening   Specimen: Nasal Mucosa; Nasopharyngeal  Result Value Ref Range   MRSA by PCR NEGATIVE NEGATIVE  Protime-INR  Result Value Ref Range   Prothrombin Time 34.7 (H) 11.4 - 15.2 seconds   INR 3.4 (H) 0.8 - 1.2  APTT  Result Value Ref Range   aPTT 45 (H) 24 - 36 seconds  CBC  Result Value Ref Range   WBC 14.7 (H) 4.0 - 10.5 K/uL   RBC 4.08 (L) 4.22 - 5.81 MIL/uL   Hemoglobin 12.7 (L) 13.0 - 17.0  g/dL   HCT 38.4 (L) 39.0 - 52.0 %   MCV 94.1 80.0 - 100.0 fL   MCH 31.1 26.0 - 34.0 pg   MCHC 33.1 30.0 - 36.0 g/dL   RDW 14.4 11.5 - 15.5 %   Platelets 216 150 - 400 K/uL   nRBC 0.0 0.0 - 0.2 %  Differential  Result Value Ref Range   Neutrophils Relative % 80 %   Neutro Abs 11.7 (H) 1.7 - 7.7 K/uL   Lymphocytes Relative 10 %   Lymphs Abs 1.4 0.7 - 4.0 K/uL   Monocytes Relative 10 %   Monocytes Absolute 1.5 (H) 0.1 - 1.0 K/uL   Eosinophils Relative 0 %   Eosinophils Absolute 0.0 0.0 - 0.5 K/uL   Basophils Relative 0 %   Basophils Absolute 0.0 0.0 - 0.1 K/uL   Immature Granulocytes 0 %   Abs Immature Granulocytes 0.05 0.00 - 0.07 K/uL  Comprehensive metabolic panel  Result Value Ref Range   Sodium 138 135 - 145 mmol/L   Potassium 4.1 3.5 - 5.1 mmol/L   Chloride 107 98 - 111 mmol/L   CO2 23 22 - 32 mmol/L   Glucose, Bld 140 (H) 70 - 99 mg/dL   BUN 16 8 - 23 mg/dL   Creatinine, Ser 0.94 0.61 - 1.24 mg/dL   Calcium 8.8 (L) 8.9 - 10.3 mg/dL   Total Protein 7.0 6.5 - 8.1 g/dL   Albumin 3.8 3.5 - 5.0 g/dL   AST 34 15 - 41 U/L  ALT 33 0 - 44 U/L   Alkaline Phosphatase 65 38 - 126 U/L   Total Bilirubin 0.9 0.3 - 1.2 mg/dL   GFR, Estimated >60 >60 mL/min   Anion gap 8 5 - 15  Triglycerides  Result Value Ref Range   Triglycerides 70 <150 mg/dL  Protime-INR  Result Value Ref Range   Prothrombin Time 17.5 (H) 11.4 - 15.2 seconds   INR 1.4 (H) 0.8 - 1.2  Basic metabolic panel  Result Value Ref Range   Sodium 140 135 - 145 mmol/L   Potassium 3.8 3.5 - 5.1 mmol/L   Chloride 111 98 - 111 mmol/L   CO2 24 22 - 32 mmol/L   Glucose, Bld 141 (H) 70 - 99 mg/dL   BUN 14 8 - 23 mg/dL   Creatinine, Ser 0.88 0.61 - 1.24 mg/dL   Calcium 8.2 (L) 8.9 - 10.3 mg/dL   GFR, Estimated >60 >60 mL/min   Anion gap 5 5 - 15  Magnesium  Result Value Ref Range   Magnesium 2.1 1.7 - 2.4 mg/dL  Magnesium  Result Value Ref Range   Magnesium 2.0 1.7 - 2.4 mg/dL  Basic metabolic panel  Result  Value Ref Range   Sodium 138 135 - 145 mmol/L   Potassium 4.1 3.5 - 5.1 mmol/L   Chloride 109 98 - 111 mmol/L   CO2 22 22 - 32 mmol/L   Glucose, Bld 161 (H) 70 - 99 mg/dL   BUN 17 8 - 23 mg/dL   Creatinine, Ser 0.85 0.61 - 1.24 mg/dL   Calcium 8.2 (L) 8.9 - 10.3 mg/dL   GFR, Estimated >60 >60 mL/min   Anion gap 7 5 - 15  Protime-INR  Result Value Ref Range   Prothrombin Time 14.7 11.4 - 15.2 seconds   INR 1.1 0.8 - 1.2  Magnesium  Result Value Ref Range   Magnesium 2.2 1.7 - 2.4 mg/dL  Basic metabolic panel  Result Value Ref Range   Sodium 138 135 - 145 mmol/L   Potassium 3.9 3.5 - 5.1 mmol/L   Chloride 111 98 - 111 mmol/L   CO2 21 (L) 22 - 32 mmol/L   Glucose, Bld 166 (H) 70 - 99 mg/dL   BUN 21 8 - 23 mg/dL   Creatinine, Ser 0.76 0.61 - 1.24 mg/dL   Calcium 8.0 (L) 8.9 - 10.3 mg/dL   GFR, Estimated >60 >60 mL/min   Anion gap 6 5 - 15  Triglycerides  Result Value Ref Range   Triglycerides 70 <150 mg/dL  Magnesium  Result Value Ref Range   Magnesium 2.4 1.7 - 2.4 mg/dL  Basic metabolic panel  Result Value Ref Range   Sodium 139 135 - 145 mmol/L   Potassium 4.1 3.5 - 5.1 mmol/L   Chloride 112 (H) 98 - 111 mmol/L   CO2 22 22 - 32 mmol/L   Glucose, Bld 141 (H) 70 - 99 mg/dL   BUN 29 (H) 8 - 23 mg/dL   Creatinine, Ser 0.74 0.61 - 1.24 mg/dL   Calcium 8.4 (L) 8.9 - 10.3 mg/dL   GFR, Estimated >60 >60 mL/min   Anion gap 5 5 - 15  CBG monitoring, ED  Result Value Ref Range   Glucose-Capillary 125 (H) 70 - 99 mg/dL  I-stat chem 8, ED  Result Value Ref Range   Sodium 141 135 - 145 mmol/L   Potassium 4.0 3.5 - 5.1 mmol/L   Chloride 107 98 - 111 mmol/L   BUN  17 8 - 23 mg/dL   Creatinine, Ser 0.90 0.61 - 1.24 mg/dL   Glucose, Bld 138 (H) 70 - 99 mg/dL   Calcium, Ion 1.14 (L) 1.15 - 1.40 mmol/L   TCO2 25 22 - 32 mmol/L   Hemoglobin 12.6 (L) 13.0 - 17.0 g/dL   HCT 37.0 (L) 39.0 - 52.0 %  I-Stat arterial blood gas, ED  Result Value Ref Range   pH, Arterial 7.434 7.350  - 7.450   pCO2 arterial 40.2 32.0 - 48.0 mmHg   pO2, Arterial 504 (H) 83.0 - 108.0 mmHg   Bicarbonate 27.0 20.0 - 28.0 mmol/L   TCO2 28 22 - 32 mmol/L   O2 Saturation 100.0 %   Acid-Base Excess 2.0 0.0 - 2.0 mmol/L   Sodium 140 135 - 145 mmol/L   Potassium 3.8 3.5 - 5.1 mmol/L   Calcium, Ion 1.16 1.15 - 1.40 mmol/L   HCT 34.0 (L) 39.0 - 52.0 %   Hemoglobin 11.6 (L) 13.0 - 17.0 g/dL   Patient temperature 98.1 F    Collection site Radial    Drawn by RT    Sample type ARTERIAL   Type and screen  Result Value Ref Range   ABO/RH(D) A POS    Antibody Screen NEG    Sample Expiration 07/31/2020,2359    Unit Number [id number]    Blood Component Type RED CELLS,LR    Unit division 00    Status of Unit ALLOCATED    Transfusion Status OK TO TRANSFUSE    Crossmatch Result Compatible    Unit Number [id number]    Blood Component Type RED CELLS,LR    Unit division 00    Status of Unit REL FROM [facility]    Transfusion Status OK TO TRANSFUSE    Crossmatch Result      Compatible Performed at [facility], [address] [place], [place] [address]    Unit Number [id number]    Blood Component Type RED CELLS,LR    Unit division 00    Status of Unit ALLOCATED    Transfusion Status OK TO TRANSFUSE    Crossmatch Result Compatible    Unit Number [id number]    Blood Component Type RED CELLS,LR    Unit division 00    Status of Unit ALLOCATED    Transfusion Status OK TO TRANSFUSE    Crossmatch Result Compatible   Prepare RBC (crossmatch)  Result Value Ref Range   Order Confirmation      ORDER PROCESSED BY BLOOD BANK Performed at [facility], [place] [address] [place], [place] [address]   BP[facility]  Result Value Ref Range   ISSUE DATE / TIME 301601093235    Blood Product Unit Number [id number]    PRODUCT CODE C3762G31    Unit Type and Rh 5176    Blood Product Expiration Date 160737106269    ISSUE DATE / TIME 485462703500    Blood Product Unit Number [id number]     PRODUCT CODE C7893Y10    Unit Type and Rh 1751    Blood Product Expiration Date [id number]    ISSUE DATE / TIME 353614431540    Blood Product Unit Number [id number]    PRODUCT CODE Z1245Y09    Unit Type and Rh 6200    Blood Product Expiration Date 983382505397    ISSUE DATE / TIME 673419379024    Blood Product Unit Number [id number]    PRODUCT CODE S3419Q22    Unit Type and Rh 2979  Blood Product Expiration Date 539767341937     CT HEAD WO CONTRAST  Result Date: 07/29/2020 CLINICAL DATA:  Subdural hemorrhage. EXAM: CT HEAD WITHOUT CONTRAST TECHNIQUE: Contiguous axial images were obtained from the base of the skull through the vertex without intravenous contrast. COMPARISON:  Jul 28, 2020. FINDINGS: Brain: Interval right craniotomy for evacuation of a right subdural hemorrhage with Gel-Foam and small areas of pneumocephalus subjacent to the craniotomy site. Drain is noted, entering at the site of craniotomy and coursing along the right frontal parietal convexity, terminating at the posterior right vertex. Subdural hemorrhage is significantly decreased in size. At the site of craniectomy, extra-axial material measures up to approximately 1 cm; however, this is largely from Gel-Foam with small amount hemorrhage at this site. More anteriorly, subdural hemorrhage measures up to approximately 7 mm in thickness. Mass effect is improved with approximately 5 mm of leftward midline shift at the foramina [place]. Leftward subfalcine herniation is also improved. Basal cisterns are patent. Hyperdense hemorrhage layers along the falx and right tentorial leaflet, not substantially changed. Similar small volume of subarachnoid hemorrhage along the high left frontal parasagittal convexity. Additional site of extra-axial hemorrhage along the left parietotemporal convexity, measuring up to 1 cm in thickness (series 3, image 19) with similar adjacent area of hypodensity. Similar small volume of  intraventricular hemorrhage layering in the left occipital horn. No progressive ventriculomegaly. No evidence of interval large vascular territory infarct. Similar punctate nonspecific areas of mineralization in bilateral frontal lobes. Vascular: No hyperdense vessel identified. Calcific atherosclerosis. Skull: Posterior left scalp contusion without acute fracture. Sinuses/Orbits: Mucosal thickening of the right greater than left maxillary sinuses with frothy secretions in the right maxillary sinus. Other: No mastoid effusions. IMPRESSION: 1. Significantly decreased size of an acute right subdural hemorrhage status post craniotomy and drainage, as detailed above. Improved mass effect with 5 mm of leftward midline shift. 2. Similar subdural hemorrhage along the falx and right tentorial leaflet, small volume high left frontal parasagittal subarachnoid hemorrhage, small volume intraventricular hemorrhage, and additional 1.1 cm thick extra-axial hemorrhage along the left parietotemporal convexity. Electronically Signed   By: Margaretha Sheffield MD   On: 07/29/2020 06:46   CT HEAD WO CONTRAST  Result Date: 07/28/2020 CLINICAL DATA:  Altered mental status EXAM: CT HEAD WITHOUT CONTRAST TECHNIQUE: Contiguous axial images were obtained from the base of the skull through the vertex without intravenous contrast. COMPARISON:  07/27/2020 FINDINGS: Brain: There are again noted bilateral subdural hematomas right greater than left. Mixed attenuation is noted particularly on the right. Significant mass effect upon the right cerebral hemisphere is noted with evidence of midline shift. The degree of shift is approximately 10 mm chest improved when compared with the prior exam. Additionally the right lateral ventricle appears more prominent when compared with the previous day. Hemorrhage along the tentorium on the right as well as the falx cerebri is again seen. Mild atrophic changes are noted. A small amount of intraventricular  hemorrhage in the left lateral ventricle is noted posteriorly. No other definitive intraventricular hemorrhage is seen. 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 No new infarct is seen at this time. Vascular: No hyperdense vessel or unexpected calcification. Skull: Normal. Negative for fracture or focal lesion. Sinuses/Orbits: Air-fluid level is noted within the right maxillary antrum. Other: Mild scalp swelling is noted on the left posteriorly consistent with the recent injury. IMPRESSION: Persistent bilateral subdural hematomas slightly smaller than that seen on the prior exam with a lesser degree of midline shift  when compared with the prior study. Small amount of intraventricular hemorrhage is noted in the left lateral ventricle. Atrophic changes and findings of prior left temporoparietal infarct are noted. Electronically Signed   By: Inez Catalina M.D.   On: 07/28/2020 08:26   CT Head Wo Contrast  Result Date: 07/26/2020 CLINICAL DATA:  Fall, on blood thinners.  Posterior hematoma EXAM: CT HEAD WITHOUT CONTRAST TECHNIQUE: Contiguous axial images were obtained from the base of the skull through the vertex without intravenous contrast. COMPARISON:  08/05/2013 FINDINGS: Brain: Old infarct in the posterior left parietal region, stable. No acute intracranial abnormality. Specifically, no hemorrhage, hydrocephalus, mass lesion, acute infarction, or significant intracranial injury. Vascular: No hyperdense vessel or unexpected calcification. Skull: No acute calvarial abnormality. Sinuses/Orbits: Mucosal thickening in the right maxillary sinus. No air-fluid levels. Other: Posterior scalp hematoma in the left occipital region. 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 No acute intracranial abnormality. Electronically Signed   By: Rolm Baptise M.D.   On: 07/26/2020 13:14   CT Cervical Spine Wo Contrast  Result Date: 07/26/2020 CLINICAL DATA:  Fall, on blood thinners. EXAM: CT CERVICAL  SPINE WITHOUT CONTRAST TECHNIQUE: Multidetector CT imaging of the cervical spine was performed without intravenous contrast. Multiplanar CT image reconstructions were also generated. COMPARISON:  None FINDINGS: Alignment: No subluxation. Skull base and vertebrae: No acute fracture. No primary bone lesion or focal pathologic process. Soft tissues and spinal canal: No prevertebral fluid or swelling. No visible canal hematoma. Disc levels: Diffuse degenerative disc disease with disc space narrowing and spurring. Diffuse degenerative facet disease bilaterally. Upper chest: No acute findings. Other: None IMPRESSION: Diffuse degenerative disc and facet disease. No acute bony abnormality. Electronically Signed   By: Rolm Baptise M.D.   On: 07/26/2020 13:15   DG Chest Portable 1 View  Result Date: 07/27/2020 CLINICAL DATA:  80 year old male status post intubation. EXAM: PORTABLE CHEST 1 VIEW COMPARISON:  Chest radiograph dated 07/26/2020. FINDINGS: Endotracheal tube with tip approximately 4.5 cm above the carina. Enteric tube extends below the diaphragm with tip beyond the inferior margin of the image. Left lung base atelectasis versus less likely infiltrate. There is no pleural effusion pneumothorax. Stable cardiomegaly. 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 A battery pack noted over the left chest. Atherosclerotic calcification of the aorta. No acute osseous pathology. IMPRESSION: 1. Endotracheal tube above the carina. 2. Left lung base atelectasis versus less likely infiltrate. Electronically Signed   By: Anner Crete M.D.   On: 07/27/2020 20:31   DG Chest Portable 1 View  Result Date: 07/26/2020 CLINICAL DATA:  Syncope EXAM: PORTABLE CHEST 1 VIEW COMPARISON:  08/06/2013 FINDINGS: Prior sternotomy and cardiac valve replacement. Cardiomegaly. Atherosclerotic calcification of the aortic knob. Mild pulmonary vascular congestion. No focal airspace consolidation, pleural effusion, or pneumothorax.  IMPRESSION: Cardiomegaly and mild pulmonary vascular congestion. Electronically Signed   By: Davina Poke D.O.   On: 07/26/2020 13:08   CT HEAD CODE STROKE WO CONTRAST  Result Date: 07/27/2020 CLINICAL DATA:  Code stroke. EXAM: CT HEAD WITHOUT CONTRAST TECHNIQUE: Contiguous axial images were obtained from the base of the skull through the vertex without intravenous contrast. COMPARISON:  07/26/2020 FINDINGS: Brain: Acute, mixed density right cerebral convexity subdural hematoma measuring up to 2.2 cm in thickness. Areas of low density likely due to presence of anticoagulation. Additional acute subdural hematoma underlying the posterior left cerebral convexity measuring up to 1.1 cm in thickness. Small volume subdural hemorrhage is present along the right aspect of the falx and  tentorium. There is mass effect with regional sulcal effacement. Subfalcine herniation is present with leftward midline shift measuring up to 1.8 cm. There is early trapping of the left lateral ventricle. Partial effacement of the suprasellar cistern. No acute appearing loss of gray-white differentiation. Chronic left parietotemporal infarct with expected dilatation of the posterior left lateral ventricle. Vascular: Atherosclerotic calcification at the skull base. Skull: Calvarium is intact. Sinuses/Orbits: Right maxillary sinus mucosal thickening. Other: Mastoid air cells are clear. Left posterior scalp soft tissue swelling. IMPRESSION: Acute right much larger than left subdural hematomas. Additional small volume subdural hemorrhage along the falx and tentorium. Mass effect including subfalcine herniation with leftward midline shift of 1.8 cm and early trapping of the left lateral ventricle. These results were communicated to Dr. Leonel Ramsay at 6:46 pm on 07/27/2020 by text page via the [employer] messaging system. Electronically Signed   By: Macy Mis M.D.   On: 07/27/2020 18:54    Antibiotics:  Anti-infectives (From admission,  onward)   Start     Dose/Rate Route Frequency Ordered Stop   07/28/20 2330  ceFAZolin (ANCEF) IVPB 1 g/50 mL premix        1 g 100 mL/hr over 30 Minutes Intravenous Every 8 hours 07/28/20 1732 07/29/20 0955      Discharge Exam: Blood pressure (!) 158/83, pulse 61, temperature (!) 97.5 F (36.4 C), temperature source Axillary, resp. rate 19, height 5\' 8"  (1.727 m), weight 92.7 kg, SpO2 98 %. Neurologic: Grossly normal Ambulating and voiding well, incision cdi   Discharge Medications:   Allergies as of 07/31/2020      Reactions   Naproxen Hives   No [employer] Food Allergies Other (See Comments)   Scallops - distress, nausea and vomitting      Medication List    TAKE these medications   atorvastatin 80 MG tablet Commonly known as: LIPITOR TAKE 1 TABLET (80 MG TOTAL) BY MOUTH EVERY EVENING.   atorvastatin 80 MG tablet Commonly known as: LIPITOR Take 1 tablet by mouth daily.   clobetasol cream 0.05 % Commonly known as: TEMOVATE Apply 1 application topically daily as needed (for skin irritation).   Co Q-10 300 MG Caps Take 300 mg by mouth daily.   COSAMIN DS PO Take 1 tablet by mouth 2 (two) times daily.   docusate sodium 100 MG capsule Commonly known as: COLACE Take 100 mg by mouth daily.   dofetilide 500 MCG capsule Commonly known as: TIKOSYN Take 1 capsule (500 mcg total) by mouth 2 (two) times daily.   dofetilide 500 MCG capsule Commonly known as: TIKOSYN Take 500 mcg by mouth every 12 (twelve) hours.   finasteride 5 MG tablet Commonly known as: PROSCAR Take 5 mg by mouth daily.   furosemide 20 MG tablet Commonly known as: LASIX Take 1 tablet (20 mg total) by mouth daily.   loratadine 10 MG tablet Commonly known as: CLARITIN Take 10 mg by mouth daily as needed for allergies.   losartan 25 MG tablet Commonly known as: COZAAR Take 1 tablet (25 mg total) by mouth daily.   losartan 25 MG tablet Commonly known as: COZAAR Take 25 mg by mouth daily.    metoprolol succinate 50 MG 24 hr tablet Commonly known as: TOPROL-XL Take 75 mg (1.5 tabs) by mouth twice daily. Take with or immediately following a meal.   metoprolol succinate 50 MG 24 hr tablet Commonly known as: TOPROL-XL Take 50 mg by mouth every 12 (twelve) hours.   Moderna COVID-19 Vaccine 100 MCG/0.5ML  injection Generic drug: COVID-19 mRNA vaccine (Moderna) Inject into the muscle.   Multivital-M Tabs Take 1 tablet by mouth daily.   potassium chloride SA 20 MEQ tablet Commonly known as: KLOR-CON Take 1 tablet (20 mEq total) by mouth daily.   potassium chloride SA 20 MEQ tablet Commonly known as: KLOR-CON Take 20 mEq by mouth daily.   PROBIOTIC FORMULA PO Take 1 capsule by mouth 2 (two) times daily.   tamsulosin 0.4 MG Caps capsule Commonly known as: FLOMAX Take 0.4 mg by mouth 2 (two) times daily.   tamsulosin 0.4 MG Caps capsule Commonly known as: FLOMAX Take 0.8 mg by mouth every 12 (twelve) hours.   Vitamin B-12 5000 MCG Subl Place 5,000 mcg under the tongue daily at 2 PM.   warfarin 5 MG tablet Commonly known as: COUMADIN Take as directed. If you are unsure how to take this medication, talk to your nurse or doctor. Original instructions: As directed   warfarin 5 MG tablet Commonly known as: COUMADIN Take as directed. If you are unsure how to take this medication, talk to your nurse or doctor. Original instructions: Take 5 mg by mouth daily.       Disposition: CIR   Final Dx: Right sided Subdural hematoma  Discharge Instructions    Call MD for:  difficulty breathing, headache or visual disturbances   Complete by: As directed    Call MD for:  hives   Complete by: As directed    Call MD for:  persistant nausea and vomiting   Complete by: As directed    Call MD for:  redness, tenderness, or signs of infection (pain, swelling, redness, odor or green/yellow discharge around incision site)   Complete by: As directed    Call MD for:  severe  uncontrolled pain   Complete by: As directed    Call MD for:  temperature >100.4   Complete by: As directed    Diet - low sodium heart healthy   Complete by: As directed    Increase activity slowly   Complete by: As directed    No wound care   Complete by: As directed          Signed: Ocie Cornfield [name] [name] 07/31/2020, 10:54 AM

## 2020-07-31 NOTE — IPOC Note (Signed)
Overall Plan of Care [name]) Patient Details Name: [name] [name] MRN: [id number] DOB: Sep 03, 1940  Admitting Diagnosis: Subdural hemorrhage following injury [facility])  Hospital Problems: Principal Problem:   Subdural hemorrhage following injury [facility])     Functional Problem List: Nursing Behavior,Bladder,Bowel,Endurance,Medication Management,Nutrition,Pain,Safety,Skin Integrity  PT Balance,Perception,Behavior,Safety,Edema,Sensory,Endurance,Skin Integrity,Motor,Nutrition,Pain  OT Balance,Cognition,Endurance,Motor,Vision,Sensory,Safety,Perception  SLP Cognition  TR         Basic ADL's: OT Grooming,Bathing,Dressing,Toileting     Advanced  ADL's: OT       Transfers: PT Bed Mobility,Bed to [place]  OT Toilet,Tub/Shower     Locomotion: PT Ambulation,Stairs     Additional Impairments: OT Fuctional Use of Upper Extremity  SLP        TR      Anticipated Outcomes Item Anticipated Outcome  Self Feeding independent  Swallowing      Basic self-care  supervision  Toileting  supervision   Bathroom Transfers supervision  Bowel/Bladder  supervision  Transfers  supervision using LRAD  Locomotion  supervision using LRAD  Communication     Cognition  Supervision  Pain  < 3  Safety/Judgment  supervision and no falls   Therapy Plan: PT Intensity: Minimum of 1-2 x/day ,45 to 90 minutes PT Frequency: 5 out of 7 days PT Duration Estimated Length of Stay: 2-2.5 weeks OT Intensity: Minimum of 1-2 x/day, 45 to 90 minutes OT Frequency: 5 out of 7 days OT Duration/Estimated Length of Stay: 14-17 days SLP Intensity: Minumum of 1-2 x/day, 30 to 90 minutes SLP Frequency: 3 to 5 out of 7 days SLP Duration/Estimated Length of Stay: 2-3 weeks   Due to the current state of emergency, patients may not be receiving their 3-hours of Medicare-mandated therapy.   Team Interventions: Nursing Interventions Patient/Family Education,Bladder Management,Bowel  Management,Disease Management/Prevention,Pain Management,Medication Management,Skin Care/Wound Management,Cognitive Remediation/Compensation,Discharge Planning,Psychosocial Support  PT interventions Ambulation/gait training,Community Corporate treasurer re-education,Psychosocial support,Stair training,UE/LE Strength taining/ROM,Balance/vestibular training,Functional electrical stimulation,Discharge planning,Pain management,Skin care/wound management,Therapeutic Activities,UE/LE Coordination activities,Cognitive remediation/compensation,Disease management/prevention,Functional mobility training,Patient/family education,Splinting/orthotics,Therapeutic Exercise,Visual/perceptual remediation/compensation  OT Interventions Balance/vestibular training,Cognitive remediation/compensation,Community reintegration,DME/adaptive equipment instruction,Discharge planning,Functional electrical stimulation,Functional mobility training,Neuromuscular re-education,Patient/family education,Pain management,Self Care/advanced ADL retraining,Therapeutic Activities,UE/LE Coordination activities,Therapeutic Exercise,UE/LE Strength taining/ROM,Visual/perceptual remediation/compensation  SLP Interventions Cognitive remediation/compensation,Therapeutic Activities,Therapeutic Exercise,Internal/external aids,Cueing hierarchy,Functional tasks,Patient/family education,Medication managment  TR Interventions    SW/CM Interventions     Barriers to Discharge MD  Medical stability  Nursing Decreased caregiver support,Home environment access/layout,Incontinence,Lack of/limited family support,Weight,Medication compliance,Behavior,Nutrition means    PT      OT      SLP      SW       Team Discharge Planning: Destination: PT-Home ,OT- Home , SLP-Home Projected Follow-up: PT-Outpatient PT,24 hour supervision/assistance, OT-  24 hour supervision/assistance,Home health OT, SLP-Other (comment) (tbd  based on rehab progress) Projected Equipment Needs: PT-To be determined, OT- To be determined, SLP-None recommended by SLP Equipment Details: PT- , OT-  Patient/family involved in discharge planning: PT- Patient,Family member/caregiver,  OT-Patient,Family member/caregiver, SLP-Patient,Family member/caregiver  MD ELOS: 14-17 days Medical Rehab Prognosis:  Excellent Assessment: The patient has been admitted for CIR therapies with the diagnosis of bilateral SDH's after fall s/p right craniotomy. The team will be addressing functional mobility, strength, stamina, balance, safety, adaptive techniques and equipment, self-care, bowel and bladder mgt, patient and caregiver education, cognition, communication, community reentry. Goals have been set at supervision for mobility, self-care, cognition.   Due to the current state of emergency, patients may not be receiving their 3 hours per day of Medicare-mandated therapy.    Meredith Staggers, MD, Mellody Drown  See Team Conference Notes for weekly updates to the plan of care

## 2020-07-31 NOTE — Progress Notes (Signed)
Secure chatted with Dr. Kingsley Callander recommends holding coumadin for at least a week or so. Also recommended repeating CT head on Mon or Tues-->orders written for Monday.

## 2020-07-31 NOTE — Progress Notes (Addendum)
Inpatient Rehabilitation Medication Review by a Pharmacist  A complete drug regimen review was completed for this patient to identify any potential clinically significant medication issues.  Clinically significant medication issues were identified:  yes   Type of Medication Issue Identified Description of Issue Urgent (address now) Non-Urgent (address on AM team rounds) Plan Plan Accepted by Provider? (Yes / No / Pending AM Rounds)                                Additional Drug Therapy Needed  atorvastatin, clobetasol, docusate, loratadine, potassium - on dc summary but not on tx Non-urgent Will need to determine if these will need to be restarted Team will assess and resume as need per Algis Liming  Other         Name of provider notified for urgent issues identified: Algis Liming, PA  Provider Method of Notification: Secure chat   For non-urgent medication issues to be resolved on team rounds tomorrow morning a [facility] Secure Chat Handoff was sent to:    Pharmacist comments: Discharged summary said to continue coumadin but pt with recent SDH. Rehab is aware and plan to discuss with NS.   Time spent performing this drug regimen review (minutes): 10

## 2020-07-31 NOTE — Progress Notes (Signed)
PMR Admission Coordinator Pre-Admission Assessment   Patient: [name] [name] is an 80 y.o., male MRN: [id number] DOB: 05-25-1940 Height: 5' 8" (172.7 cm) Weight: 92.7 kg                                                                                                                                                  Insurance Information HMO:     PPO:      PCP:      IPA:      80/20:      OTHER:  PRIMARY: Medicare A and B      Policy#: [id number]      Subscriber: pt CM Name:       Phone#:      Fax#:  Pre-Cert#: verified Civil engineer, contracting:  Benefits:  Phone #:      Name:  Eff. Date: A 09/05/07, B 03/07/08     Deduct: $1556      Out of Pocket Max: n/a      Life Max: n/a  CIR: 100%      SNF: 20 full days Outpatient: 80%     Co-Ins: 20% Home Health: 100%      Co-Pay:  DME: 80%     Co-Ins: 20% Providers:  SECONDARY: BCBS Supplemental      Policy#: [id number]      Phone#:    Development worker, community:       Phone#:    The Engineer, petroleum" for patients in Inpatient Rehabilitation Facilities with attached "Privacy Act [place] Records" was provided and verbally reviewed with: Patient and Family   Emergency Contact Information         Contact Information     Name Relation Home Work Mobile    [name] [name] [name] [name]     [place] Son [name] [name], [name] [name] Son     [phone]       Current Medical History  Patient Admitting Diagnosis: SDH [name]/p craniotomy   History of Present Illness: [name] [name] is a 80 y.o. right-handed male with history of rheumatic heart disease, atrial fibrillation, diastolic congestive heart failure, aortic valve replacement, mitral valve replacement, dysrhythmia maintained on Tikosyn, prediabetes.  Pt had a fall at church on 5/22 with positive loss of consciousness.  Cranial CT scan initially negative on presentation to ED on 5/22 so he was discharged home.  On 5/23 pt developed AMS and patient decompensated  quickly with EMS.  [facility] to [facility] on [facility] with follow-up CT showing acute right much larger than left subdural hematomas.  Additional small volume subdural hemorrhage along the falx and tentorium.  Mass-effect including subfalcine herniation with leftward midline shift of 1.8 cm and early trapping of the left lateral ventricle.  Initially pt without corneal  or gag reflex, and posturing with poor prognosis.  Overnight, pt improved significantly with return of reflexes and following commands.  Patient underwent right craniotomy for evacuation of subdural hematoma 07/28/2020 per Dr. Sherley Bounds.  He was extubated 07/29/2020.  Decadron protocol as indicated.  Maintained on Keppra for seizure prophylaxis.  Tolerating a regular diet.  Therapy evaluations completed and pt was recommended for CIR.    Complete NIHSS TOTAL: 10 Glasgow Coma Scale Score: 14   Past Medical History      Past Medical History:  Diagnosis Date  . Acute rheumatic heart disease, unspecified      childhood, age  72 & 64  . Acute rheumatic pericarditis    . Atrial fibrillation ([place])      history  . CHF (congestive heart failure) ([place])    . Diverticulosis    . Dysrhythmia    . 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 External hemorrhoids without mention of complication    . H/O aortic valve replacement    . H/O mitral valve replacement    . 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 Lesion of ulnar nerve    . 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 Other and unspecified hyperlipidemia    . Pre-diabetes    . Previous back surgery 1978, jan 2007  . Psychosexual dysfunction with inhibited sexual excitement    . SOB (shortness of breath)      "with heavy exercise"  . Stroke ([place]) 08/2013    "I WAS IN AFIB AND THREW A CLOT, THE EFFECTS WERE TRANSITORY AND DIDNT LAST BUT FOR 30 MINUTES"   . Thoracic aortic aneurysm ([facility])        Family History  family history includes Cancer in his father; Diabetes in his  brother and paternal aunt; Heart attack in his maternal grandfather; Macular degeneration in his mother.   Prior Rehab/Hospitalizations:  Has the patient had prior rehab or hospitalizations prior to admission? No   Has the patient had major surgery during 100 days prior to admission? Yes   Current Medications    Current Facility-Administered Medications:  .  0.9 % NaCl with KCl 20 mEq/ L  infusion, , Intravenous, Continuous, Eustace Woolridge, MD, Last Rate: 75 mL/hr at 07/31/20 1000, Infusion Verify at 07/31/20 1000 .  acetaminophen (TYLENOL) tablet 650 mg, 650 mg, Oral, Q4H PRN, 650 mg at 07/31/20 1035 **OR** acetaminophen (TYLENOL) suppository 650 mg, 650 mg, Rectal, Q4H PRN, Eustace Stender, MD .  chlorhexidine gluconate (MEDLINE KIT) (PERIDEX) 0.12 % solution 15 mL, 15 mL, Mouth Rinse, BID, Anders Simmonds, MD, 15 mL at 07/31/20 0904 .  Chlorhexidine Gluconate Cloth 2 % PADS 6 each, 6 each, Topical, Daily, Eustace Elmore, MD, 6 each at 07/31/20 1030 .  [COMPLETED] dexamethasone (DECADRON) injection 6 mg, 6 mg, Intravenous, Q6H, 6 mg at 07/29/20 1228 **FOLLOWED BY** [COMPLETED] dexamethasone (DECADRON) injection 4 mg, 4 mg, Intravenous, Q6H, 4 mg at 07/30/20 1250 **FOLLOWED BY** dexamethasone (DECADRON) injection 4 mg, 4 mg, Intravenous, Q8H, Eustace Nafziger, MD, 4 mg at 07/31/20 6440 .  dofetilide (TIKOSYN) capsule 500 mcg, 500 mcg, Oral, BID, Eustace Schetter, MD, 500 mcg at 07/31/20 3474 .  furosemide (LASIX) tablet 20 mg, 20 mg, Oral, Daily, Eustace Rung, MD, 20 mg at 07/31/20 1035 .  HYDROcodone-acetaminophen (NORCO/VICODIN) 5-325 MG per tablet 1 tablet, 1 tablet, Oral, Q4H PRN, Eustace Carusone, MD .  labetalol (NORMODYNE) injection 10-40 mg, 10-40 mg, Intravenous, Q10 min PRN, Eustace Moody, MD, 20 mg at 07/29/20 1435 .  levETIRAcetam (KEPPRA) tablet 500 mg, 500 mg, Oral, BID, Audria Nine, DO, 500 mg at 07/31/20 1035 .  losartan (COZAAR) tablet 25 mg, 25 mg, Oral, Daily, Eustace Haq, MD, 25 mg at 07/31/20 1034 .  methocarbamol (ROBAXIN) 500 mg in dextrose 5 % 50 mL IVPB, 500 mg, Intravenous, Q8H PRN, Meyran, Ocie Cornfield, NP, Stopped at 07/29/20 2341 .  metoprolol tartrate (LOPRESSOR) tablet 50 mg, 50 mg, Oral, BID, Eustace Strout, MD, 50 mg at 07/31/20 1034 .  morphine 2 MG/ML injection 1-2 mg, 1-2 mg, Intravenous, Q2H PRN, Eustace Custis, MD, 2 mg at 07/28/20 2141 .  ondansetron (ZOFRAN) tablet 4 mg, 4 mg, Oral, Q4H PRN **OR** ondansetron (ZOFRAN) injection 4 mg, 4 mg, Intravenous, Q4H PRN, Eustace Kaczmarczyk, MD .  promethazine (PHENERGAN) tablet 12.5-25 mg, 12.5-25 mg, Oral, Q4H PRN, Eustace Holten, MD .  senna [facility]) tablet 8.6 mg, 1 tablet, Oral, BID, Eustace Tarbell, MD, 8.6 mg at 07/30/20 1030   Patients Current Diet:     Diet Order                      Diet Heart Room service appropriate? Yes with Assist; Fluid consistency: Thin  Diet effective now                      Precautions / Restrictions Precautions Precautions: Fall Restrictions Weight Bearing Restrictions: No    Has the patient had 2 or more falls or a fall with injury in the past year?Yes   Prior Activity Level Community (5-7x/wk): using a SPC prior to admission, active per family, retired Chief Executive Officer   Prior Functional Level Prior Function Level of Independence: Independent with assistive device([name]) Comments: Pt utilized a SPC for ambulation.  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  Independent   Indoor Mobility: Did the patient need assistance with walking from room to room (with or without device)? Independent   Stairs: Did the patient need assistance with internal or external stairs (with or without device)? Independent   Functional Cognition: Did the patient need help planning regular tasks such as shopping or remembering to take medications? Independent   Home Assistive Devices /  Equipment   Prior Device Use: Indicate devices/aids used by the patient prior to current illness, exacerbation or injury? SPC   Current Functional Level Cognition   Overall Cognitive Status: Impaired/Different from baseline Current Attention Level: Sustained Orientation Level: Oriented to person,Disoriented to place,Disoriented to time,Disoriented to situation Following Commands: Follows one step commands consistently (requires increased time to process info and cues due to decreased attention) Safety/Judgement: Decreased awareness of deficits,Decreased awareness of safety General Comments: Pt oriented to self and situation.  He thinks it'[name] April 2016.   He intermittently knows that he is in [place] and at [facility] hospital, but in next statement says he'[name] in [place].  He is slow to respond to questions, and slow to initiate. Pt unable to perform basic math skills. Pt with no awareness into his deficits or safety, stating he thinks he did well and was close to his normal. [facility] Scales of Cognitive Functioning: Confused/appropriate    Extremity Assessment (includes Sensation/Coordination)   Upper Extremity Assessment: Defer to OT evaluation LUE  Deficits / Details: grossly 4-/5 - 4/5 LUE Sensation: decreased light touch (mildly impaired) LUE Coordination: decreased gross motor,decreased fine motor  Lower Extremity Assessment: Generalized weakness,RLE deficits/detail,LLE deficits/detail RLE Deficits / Details: MMT scores of the following: hip flexion 4-, knee extension 5, knee flexion 4+; decreased R hip flexion AROM with hx of surgery RLE Sensation: decreased light touch (decreased accuracy of location of light touch) RLE Coordination: decreased fine motor,decreased gross motor LLE Deficits / Details: MMT scores of the following: hip flexion 4-, knee extension 4+, knee flexion 4 LLE Sensation: decreased light touch (decreased accuracy of location of light touch) LLE Coordination: decreased  fine motor,decreased gross motor     ADLs   Overall ADL'[name] : Needs assistance/impaired Eating/Feeding: Set up,Supervision/ safety,Sitting Grooming: Wash/dry hands,Wash/dry face,Minimal assistance,Sitting Grooming Details (indicate cue type and reason): assist for sitting balance Upper Body Bathing: Moderate assistance,Sitting Lower Body Bathing: Maximal assistance,Sit to/from stand Upper Body Dressing : Maximal assistance,Sitting Lower Body Dressing: Maximal assistance,Sit to/from stand Lower Body Dressing Details (indicate cue type and reason): min A to don Lt sock and max A for Rt sock Toilet Transfer: Moderate assistance,+2 for physical assistance,+2 for safety/equipment,Stand-pivot,BSC Toileting- Clothing Manipulation and Hygiene: Total assistance,Sit to/from stand Functional mobility during ADLs: Moderate assistance,+2 for safety/equipment,+2 for physical assistance     Mobility   Overal bed mobility: Needs Assistance Bed Mobility: Supine to Sit Supine to sit: Mod assist,HOB elevated General bed mobility comments: Pt needing cues to remain on task to transition to sit EOB, extra time. Pt requesting therapist'[name] hand to pull trunk to ascend at EOB.     Transfers   Overall transfer level: Needs assistance Equipment used: 2 person hand held assist Transfers: Sit to/from [place] to Stand: Mod assist,+2 physical assistance,+2 safety/equipment Stand pivot transfers: Mod assist,+2 physical assistance,+2 safety/equipment General transfer comment: Pt requires assist for balance as he demonstrates a significant posterior bias. ModAx2 with UE support to steady and direct pt with coming to stand and taking side steps to transfer bed > chair, chair > commode, commode > chair.     Ambulation / Gait / Stairs / Wheelchair Mobility   Ambulation/Gait Ambulation/Gait assistance: Mod assist,+2 physical assistance,+2 safety/equipment Gait Distance (Feet): 15 Feet (x3 bouts of  ~15 ft > ~3 ft > ~3 ft) Assistive device: 2 person hand held assist Gait Pattern/deviations: Step-through pattern,Decreased step length - right,Decreased step length - left,Decreased stride length,Decreased weight shift to right,Decreased weight shift to left,Shuffle,Leaning posteriorly,Narrow base of support General Gait Details: Pt with strong posterior bias, needing cues to shift weight anteriorly into toes intermittently. Cues for upright posture. ModAx2 with bil HHA to direct and sequence weight shoft to facilitate steps anteriorly, posteriorly, and laterally. Gait velocity: reduced Gait velocity interpretation: <1.31 ft/sec, indicative of household ambulator     Posture / Balance Dynamic Sitting Balance Sitting balance - Comments: Pt requires up to mod A for static sitting.  As he fatigues or is distracted, he leans posteriorly Balance Overall balance assessment: Needs assistance Sitting-balance support: Feet supported Sitting balance-Leahy Scale: Poor Sitting balance - Comments: Pt requires up to mod A for static sitting.  As he fatigues or is distracted, he leans posteriorly Postural control: Posterior lean Standing balance support: Bilateral upper extremity supported,Single extremity supported,During functional activity Standing balance-Leahy Scale: Poor Standing balance comment: pt requires mod A for static standing.  He did progress to min A by end of session. Posterior lean needing cues to correct.     Special needs/care  consideration Continuous Drip IV  Robaxin 500 mg (100 mL/hr), Skin incision to scalp and Diabetic management pre-diabetic        Previous Home Environment (from acute therapy documentation) Living Arrangements: [name] [name]/significant other Available Help at Discharge: Family,Available 24 hours/day Home Access: Stairs to enter Additional Comments: Pt and wife just sold their home and are in process of moving to [facility] on 6/6, but are currently transient due  to quicker than expected sale of their home   Discharge Living Setting Plans for Discharge Living Setting: Other (Comment) (ILF) Type of Home at Discharge: [place] Name at Discharge: [facility] Discharge Home Layout: One level Discharge Home Access: Level entry Discharge Bathroom Shower/Tub:  Walk in shower Discharge Bathroom Toilet: Handicapped height Discharge Bathroom Accessibility: Yes How Accessible: Accessible via walker Does the patient have any problems obtaining your medications?: No   Social/Family/Support Systems Patient Roles: [name] [name] Anticipated Caregiver: [name] [name] Albi R[place], and sons Broadus John and [place] Anticipated Caregiver'[name] Contact Information: Herbert Pun [phone]; Broadus John [phone]; Warner Mccreedy [phone] Ability/Limitations of Caregiver: n/a Caregiver Availability: 24/7 Discharge Plan Discussed with Primary Caregiver: Yes Is Caregiver In Agreement with Plan?: Yes Does Caregiver/Family have Issues with Lodging/Transportation while Pt is in Rehab?: No     Goals Patient/Family Goal for Rehab: PT/OT/SLP supervision to min assist Expected length of stay: 18-21 days Pt/Family Agrees to Admission and willing to participate: Yes Program Orientation Provided & Reviewed with Pt/Caregiver Including Roles  & Responsibilities: Yes  Barriers to Discharge: Other (comments) (level of assist)     Decrease burden of Care through IP rehab admission: n/a   Possible need for SNF placement upon discharge: Possibly.  If pt does progress to a level that family can support at [place], then may need short term SNF for more rehab.       Patient Condition: This patient'[name] condition remains as documented in the consult dated 5/, in which the Rehabilitation Physician determined and documented that the patient'[name] condition is appropriate for intensive rehabilitative care in an inpatient rehabilitation facility. Will admit to inpatient rehab today.   Preadmission Screen  Completed By:  Michel Santee, PT, DPT 07/31/2020 10:40 AM ______________________________________________________________________   Discussed status with Dr. Naaman Plummer on 07/31/20 at 10:40 AM  and received approval for admission today.   Admission Coordinator:  Michel Santee, PT, DPT time 10:40 AM Sudie Grumbling 07/31/20              Cosigned by: Meredith Staggers, MD at 07/31/2020 11:01 AM

## 2020-07-31 NOTE — Progress Notes (Signed)
  Speech Language Pathology   Patient Details Name: [name] [name] MRN: [id number] DOB: 1940/11/16 Today's Date: 07/31/2020 Time: 0859-    Attempted speech-lang-cog eval however pt eating breakfast. Noted pt should be admitted to CIR today per coordinator's note. Will defer assessment to CIR                       GO                Houston Siren 07/31/2020, 11:14 AM

## 2020-07-31 NOTE — PMR Pre-admission (Signed)
PMR Admission Coordinator Pre-Admission Assessment  Patient: [name] [name] is an 80 y.o., male MRN: [id number] DOB: [birth year] Height: $RemoveBeforeDE'5\' 8"'[id number]$  (172.7 cm) Weight: 92.7 kg              Insurance Information HMO:     PPO:      PCP:      IPA:      80/20:      OTHER:  PRIMARY: Medicare A and B      Policy#: [id number]      Subscriber: pt CM Name:       Phone#:      Fax#:  Pre-Cert#: verified Civil engineer, contracting:  Benefits:  Phone #:      Name:  Eff. Date: A 09/05/07, B 03/07/08     Deduct: $1556      Out of Pocket Max: n/a      Life Max: n/a  CIR: 100%      SNF: 20 full days Outpatient: 80%     Co-Ins: 20% Home Health: 100%      Co-Pay:  DME: 80%     Co-Ins: 20% Providers:  SECONDARY: BCBS Supplemental      Policy#: [id number]      Phone#:   Development worker, community:       Phone#:   The Engineer, petroleum" for patients in Inpatient Rehabilitation Facilities with attached "Privacy Act [place] Records" was provided and verbally reviewed with: Patient and Family  Emergency Contact Information Contact Information    Name Relation Home Work Mobile   [place] S Spouse   [phone]   [name], [name] [phone]     [name], [name]   [phone]     Current Medical History  Patient Admitting Diagnosis: SDH s/p craniotomy  History of Present Illness: [name] [name] is a 80 y.o. right-handed male with history of rheumatic heart disease, atrial fibrillation, diastolic congestive heart failure, aortic valve replacement, mitral valve replacement, dysrhythmia maintained on Tikosyn, prediabetes.  Pt had a fall at church on 5/22 with positive loss of consciousness.  Cranial CT scan initially negative on presentation to ED on 5/22 so he was discharged home.  On 5/23 pt developed AMS and patient decompensated quickly with EMS.  [facility]ed to [facility] on 5/23 with follow-up CT showing acute right much larger than left subdural hematomas.  Additional small volume  subdural hemorrhage along the falx and tentorium.  Mass-effect including subfalcine herniation with leftward midline shift of 1.8 cm and early trapping of the left lateral ventricle.  Initially pt without corneal or gag reflex, and posturing with poor prognosis.  Overnight, pt improved significantly with return of reflexes and following commands.  Patient underwent right craniotomy for evacuation of subdural hematoma 07/28/2020 per Dr. Sherley Bounds.  He was extubated 07/29/2020.  Decadron protocol as indicated.  Maintained on Keppra for seizure prophylaxis.  Tolerating a regular diet.  Therapy evaluations completed and pt was recommended for CIR.   Complete NIHSS TOTAL: 10 Glasgow Coma Scale Score: 14  Past Medical History  Past Medical History:  Diagnosis Date  . Acute rheumatic heart disease, unspecified    childhood, age  75 & 17  . Acute rheumatic pericarditis   . Atrial fibrillation ([place])    history  . CHF (congestive heart failure) ([place])   . Diverticulosis   . Dysrhythmia   . 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 External hemorrhoids without mention of complication   . H/O aortic valve replacement   .  H/O mitral valve replacement   . 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 Lesion of ulnar nerve   . 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 Other and unspecified hyperlipidemia   . Pre-diabetes   . Previous back surgery 1978, jan 2007  . Psychosexual dysfunction with inhibited sexual excitement   . SOB (shortness of breath)    "with heavy exercise"  . Stroke ([place]) 08/2013   "I WAS IN AFIB AND THREW A CLOT, THE EFFECTS WERE TRANSITORY AND DIDNT LAST BUT FOR 30 MINUTES"   . Thoracic aortic aneurysm ([facility])     Family History  family history includes Cancer in his father; Diabetes in his brother and paternal aunt; Heart attack in his maternal grandfather; Macular degeneration in his mother.  Prior Rehab/Hospitalizations:  Has the patient had prior rehab or hospitalizations  prior to admission? No  Has the patient had major surgery during 100 days prior to admission? Yes  Current Medications   Current Facility-Administered Medications:  .  0.9 % NaCl with KCl 20 mEq/ L  infusion, , Intravenous, Continuous, Eustace Kapfer, MD, Last Rate: 75 mL/hr at 07/31/20 1000, Infusion Verify at 07/31/20 1000 .  acetaminophen (TYLENOL) tablet 650 mg, 650 mg, Oral, Q4H PRN, 650 mg at 07/31/20 1035 **OR** acetaminophen (TYLENOL) suppository 650 mg, 650 mg, Rectal, Q4H PRN, Eustace Lamour, MD .  chlorhexidine gluconate (MEDLINE KIT) (PERIDEX) 0.12 % solution 15 mL, 15 mL, Mouth Rinse, BID, Anders Simmonds, MD, 15 mL at 07/31/20 0904 .  Chlorhexidine Gluconate Cloth 2 % PADS 6 each, 6 each, Topical, Daily, Eustace Verville, MD, 6 each at 07/31/20 1030 .  [COMPLETED] dexamethasone (DECADRON) injection 6 mg, 6 mg, Intravenous, Q6H, 6 mg at 07/29/20 1228 **FOLLOWED BY** [COMPLETED] dexamethasone (DECADRON) injection 4 mg, 4 mg, Intravenous, Q6H, 4 mg at 07/30/20 1250 **FOLLOWED BY** dexamethasone (DECADRON) injection 4 mg, 4 mg, Intravenous, Q8H, Eustace Dorsey, MD, 4 mg at 07/31/20 7353 .  dofetilide (TIKOSYN) capsule 500 mcg, 500 mcg, Oral, BID, Eustace Repsher, MD, 500 mcg at 07/31/20 2992 .  furosemide (LASIX) tablet 20 mg, 20 mg, Oral, Daily, Eustace Sasaki, MD, 20 mg at 07/31/20 1035 .  HYDROcodone-acetaminophen (NORCO/VICODIN) 5-325 MG per tablet 1 tablet, 1 tablet, Oral, Q4H PRN, Eustace Ayon, MD .  labetalol (NORMODYNE) injection 10-40 mg, 10-40 mg, Intravenous, Q10 min PRN, Eustace Lawal, MD, 20 mg at 07/29/20 1435 .  levETIRAcetam (KEPPRA) tablet 500 mg, 500 mg, Oral, BID, Audria Nine, DO, 500 mg at 07/31/20 1035 .  losartan (COZAAR) tablet 25 mg, 25 mg, Oral, Daily, Eustace Moise, MD, 25 mg at 07/31/20 1034 .  methocarbamol (ROBAXIN) 500 mg in dextrose 5 % 50 mL IVPB, 500 mg, Intravenous, Q8H PRN, Meyran, Ocie Cornfield, NP, Stopped at 07/29/20 2341 .  metoprolol  tartrate (LOPRESSOR) tablet 50 mg, 50 mg, Oral, BID, Eustace Sharman, MD, 50 mg at 07/31/20 1034 .  morphine 2 MG/ML injection 1-2 mg, 1-2 mg, Intravenous, Q2H PRN, Eustace Leist, MD, 2 mg at 07/28/20 2141 .  ondansetron (ZOFRAN) tablet 4 mg, 4 mg, Oral, Q4H PRN **OR** ondansetron (ZOFRAN) injection 4 mg, 4 mg, Intravenous, Q4H PRN, Eustace Hamor, MD .  promethazine (PHENERGAN) tablet 12.5-25 mg, 12.5-25 mg, Oral, Q4H PRN, Eustace Sabedra, MD .  senna [facility]) tablet 8.6 mg, 1 tablet, Oral, BID, Eustace Argueta, MD, 8.6 mg at 07/30/20 1030  Patients Current Diet:  Diet Order            Diet Heart Room service appropriate? Yes with Assist; Fluid consistency: Thin  Diet effective now                 Precautions / Restrictions Precautions Precautions: Fall Restrictions Weight Bearing Restrictions: No   Has the patient had 2 or more falls or a fall with injury in the past year?Yes  Prior Activity Level Community (5-7x/wk): using a SPC prior to admission, active per family, retired Chief Executive Officer  Prior Functional Level Prior Function Level of Independence: Independent with assistive device(s) Comments: Pt utilized a SPC for ambulation.  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[employer] Care: Did the patient need help bathing, dressing, using the toilet or eating?  Independent  Indoor Mobility: Did the patient need assistance with walking from room to room (with or without device)? Independent  Stairs: Did the patient need assistance with internal or external stairs (with or without device)? Independent  Functional Cognition: Did the patient need help planning regular tasks such as shopping or remembering to take medications? Independent  Home Assistive Devices / Equipment    Prior Device Use: Indicate devices/aids used by the patient prior to current illness, exacerbation or injury? SPC  Current Functional Level Cognition  Overall Cognitive Status: Impaired/Different from  baseline Current Attention Level: Sustained Orientation Level: Oriented to person,Disoriented to place,Disoriented to time,Disoriented to situation Following Commands: Follows one step commands consistently (requires increased time to process info and cues due to decreased attention) Safety/Judgement: Decreased awareness of deficits,Decreased awareness of safety General Comments: Pt oriented to self and situation.  He thinks it's April 2016.   He intermittently knows that he is in [place] and at [facility] hospital, but in next statement says he's in [place].  He is slow to respond to questions, and slow to initiate. Pt unable to perform basic math skills. Pt with no awareness into his deficits or safety, stating he thinks he did well and was close to his normal. [facility] Scales of Cognitive Functioning: Confused/appropriate    Extremity Assessment (includes Sensation/Coordination)  Upper Extremity Assessment: Defer to OT evaluation LUE Deficits / Details: grossly 4-/5 - 4/5 LUE Sensation: decreased light touch (mildly impaired) LUE Coordination: decreased gross motor,decreased fine motor  Lower Extremity Assessment: Generalized weakness,RLE deficits/detail,LLE deficits/detail RLE Deficits / Details: MMT scores of the following: hip flexion 4-, knee extension 5, knee flexion 4+; decreased R hip flexion AROM with hx of surgery RLE Sensation: decreased light touch (decreased accuracy of location of light touch) RLE Coordination: decreased fine motor,decreased gross motor LLE Deficits / Details: MMT scores of the following: hip flexion 4-, knee extension 4+, knee flexion 4 LLE Sensation: decreased light touch (decreased accuracy of location of light touch) LLE Coordination: decreased fine motor,decreased gross motor    ADLs  Overall ADL's : Needs assistance/impaired Eating/Feeding: Set up,Supervision/ safety,Sitting Grooming: Wash/dry hands,Wash/dry face,Minimal assistance,Sitting Grooming Details  (indicate cue type and reason): assist for sitting balance Upper Body Bathing: Moderate assistance,Sitting Lower Body Bathing: Maximal assistance,Sit to/from stand Upper Body Dressing : Maximal assistance,Sitting Lower Body Dressing: Maximal assistance,Sit to/from stand Lower Body Dressing Details (indicate cue type and reason): min A to don Lt sock and max A for Rt sock Toilet Transfer: Moderate assistance,+2 for physical assistance,+2 for safety/equipment,Stand-pivot,BSC Toileting- Clothing Manipulation and Hygiene: Total assistance,Sit to/from stand Functional mobility during ADLs: Moderate assistance,+2 for safety/equipment,+2 for physical assistance    Mobility  Overal bed mobility: Needs Assistance Bed Mobility: Supine to Sit Supine to sit: Mod assist,HOB elevated General bed mobility comments: Pt needing cues to remain on task to transition to sit EOB, extra time. Pt requesting therapist's hand to pull trunk to ascend at EOB.    Transfers  Overall transfer level: Needs assistance Equipment used: 2 person hand held assist Transfers: Sit to/from Margaret to Stand: Mod assist,+2 physical assistance,+2 safety/equipment Stand pivot transfers: Mod assist,+2 physical assistance,+2 safety/equipment General transfer comment: Pt requires assist for balance as he demonstrates a significant posterior bias. ModAx2 with UE support to steady and direct pt with coming to stand and taking side steps to transfer bed > chair, chair > commode, commode > chair.    Ambulation / Gait / Stairs / Wheelchair Mobility  Ambulation/Gait Ambulation/Gait assistance: Mod assist,+2 physical assistance,+2 safety/equipment Gait Distance (Feet): 15 Feet (x3 bouts of ~15 ft > ~3 ft > ~3 ft) Assistive device: 2 person hand held assist Gait Pattern/deviations: Step-through pattern,Decreased step length - right,Decreased step length - left,Decreased stride length,Decreased weight shift to  right,Decreased weight shift to left,Shuffle,Leaning posteriorly,Narrow base of support General Gait Details: Pt with strong posterior bias, needing cues to shift weight anteriorly into toes intermittently. Cues for upright posture. ModAx2 with bil HHA to direct and sequence weight shoft to facilitate steps anteriorly, posteriorly, and laterally. Gait velocity: reduced Gait velocity interpretation: <1.31 ft/sec, indicative of household ambulator    Posture / Balance Dynamic Sitting Balance Sitting balance - Comments: Pt requires up to mod A for static sitting.  As he fatigues or is distracted, he leans posteriorly Balance Overall balance assessment: Needs assistance Sitting-balance support: Feet supported Sitting balance-Leahy Scale: Poor Sitting balance - Comments: Pt requires up to mod A for static sitting.  As he fatigues or is distracted, he leans posteriorly Postural control: Posterior lean Standing balance support: Bilateral upper extremity supported,Single extremity supported,During functional activity Standing balance-Leahy Scale: Poor Standing balance comment: pt requires mod A for static standing.  He did progress to min A by end of session. Posterior lean needing cues to correct.    Special needs/care consideration Continuous Drip IV  Robaxin 500 mg (100 mL/hr), Skin incision to scalp and Diabetic management pre-diabetic     Previous Home Environment (from acute therapy documentation) Living Arrangements: Spouse/significant other Available Help at Discharge: Family,Available 24 hours/day Home Access: Stairs to enter Additional Comments: Pt and wife just sold their home and are in process of moving to [place] [facility] on 6/6, but are currently transient due to quicker than expected sale of their home  Discharge Living Setting Plans for Discharge Living Setting: Other (Comment) (ILF) Type of Home at Discharge: [place] Name at Discharge:  [facility] Discharge Home Layout: One level Discharge Home Access: Level entry Discharge Bathroom Shower/Tub:  Walk in shower Discharge Bathroom Toilet: Handicapped height Discharge Bathroom Accessibility: Yes How Accessible: Accessible via walker Does the patient have any problems obtaining your medications?: No  Social/Family/Support Systems Patient Roles: Spouse Anticipated Caregiver: spouse [name] [name], and sons [name] [name] and [place] Anticipated Caregiver's Contact Information: Herbert Pun [phone]; [name] [name] [phone]; Warner Mccreedy [phone] Ability/Limitations of Caregiver: n/a Caregiver Availability: 24/7 Discharge Plan Discussed with Primary Caregiver: Yes Is Caregiver In Agreement with Plan?: Yes Does Caregiver/Family have Issues with Lodging/Transportation while Pt is in Rehab?: No   Goals Patient/Family Goal for Rehab: PT/OT/SLP supervision to min assist Expected length of stay: 18-21 days Pt/Family Agrees to Admission and willing to participate: Yes Program  Orientation Provided & Reviewed with Pt/Caregiver Including Roles  & Responsibilities: Yes  Barriers to Discharge: Other (comments) (level of assist)   Decrease burden of Care through IP rehab admission: n/a  Possible need for SNF placement upon discharge: Possibly.  If pt does progress to a level that family can support at [facility], then may need short term SNF for more rehab.     Patient Condition: This patient's condition remains as documented in the consult dated 5/, in which the Rehabilitation Physician determined and documented that the patient's condition is appropriate for intensive rehabilitative care in an inpatient rehabilitation facility. Will admit to inpatient rehab today.  Preadmission Screen Completed By:  Michel Santee, PT, DPT 07/31/2020 10:40 AM ______________________________________________________________________   Discussed status with Dr. Naaman Plummer on 07/31/20 at 10:40 AM  and received approval for admission  today.  Admission Coordinator:  Michel Santee, PT, DPT time 10:40 AM Sudie Grumbling 07/31/20

## 2020-07-31 NOTE — Progress Notes (Signed)
Patient ID: [name] [name], male   DOB: 10-01-1940, 80 y.o.   MRN: [id number]  Met with patient, spouse, and son, introduced myself, and explained my role in patient's care. Explaine the rehab process, rehab schedule, team conference, and what the TBI Team consist of and who. His wife was very concerned that he was going to fall so I place both fall mats down, suggested a tele-sitter, placed the bed-alarm on the middle setting, and placed the patient in the [facility] alarm. The alarm box was turned on and wife and son felt much better. Patient's RN place order for tele-sitter. Also have given additional educational information on diabetes, counting carbohydrates, CHF, heart failure and exercise, heart healthy eating, DASH diet, eating with less salt, and DM self care. I will continue to monitor patient's progress.  [name], RN3, BSN, CBIS, [place], [facility], Inpatient Rehabilitation Office [phone] Cell [phone]

## 2020-07-31 NOTE — Progress Notes (Addendum)
Physical Therapy Treatment Patient Details Name: [name] [name] MRN: [id number] DOB: 12/27/1940 Today's Date: 07/31/2020    History of Present Illness This 80 y.o. maled admitted 5/22 after awakening from nap with AMS.  EMS was activated, however pt decompenstated quickly.  CT of head showed acute Rt much larger than Lt SDH with mass effect and 1.8 cm midline shift with trapping of lateral ventricle.  Pt was decerebrate posturing with non reactive pupils and decsion was made not to proceed with craniotomy. He was intubated with plan for son to arrive from [place].  He improved over next 24 hours with increased command following and responsiveness.  He underwent craniotomy on 5/24 for evacuation of SDH and was extubated  5/25.  He  had sustained a fall the day PTA and hit the back of his head, seen in ED with negative head CT).  PMH includes:  thoracic aortic aneurysm; stroke 2015; h/o MVR, CHF, A-Fib with multiple cardioversions    PT Comments    Pt ambulating an increased distance of up to ~20 ft with a RW and modAx1 to physically steady, +2 for safety and chair follow. Pt displays some possible L neglect as he tends to bump into obstacles on his L, needing cues to attend to and correct. Further mobility limited by onset of bowel movement during gait with pt thinking he was entering the bathroom when he was entering the hallway. Extended period of time for transferring pt to and from the commode to complete bowel movement and clean pt. Pt impulsive and needs cues often to pause and redirect/follow commands. Will continue to follow acutely. Current recommendations remain appropriate.   Follow Up Recommendations  CIR;Supervision/Assistance - 24 hour     Equipment Recommendations  Rolling walker with 5" wheels;3in1 (PT)    Recommendations for Other Services       Precautions / Restrictions Precautions Precautions: Fall Precaution Comments: posey belt Restrictions Weight Bearing Restrictions: No     Mobility  Bed Mobility Overal bed mobility: Needs Assistance Bed Mobility: Supine to Sit     Supine to sit: Mod assist;HOB elevated     General bed mobility comments: Pt needing cues to remain on task to transition to sit EOB, extra time. Pt requesting therapist's hand to pull trunk to ascend at EOB.    Transfers Overall transfer level: Needs assistance Equipment used: Rolling walker (2 wheeled) Transfers: Sit to/from [facility] Sit to Stand: Mod assist;+2 safety/equipment Stand pivot transfers: Mod assist       General transfer comment: ModAx2 for safety with sit to stand transfers, 1x from EOB, 1x from commode, and 1x from recliner, pt tending to pull up on RW and needs cues to correct hand placement. ModA to steady with stand step transfer recliner <> commode.  Ambulation/Gait Ambulation/Gait assistance: Mod assist;+2 safety/equipment Gait Distance (Feet): 20 Feet (x3 bouts of ~20 ft > ~3 ft > ~3 ft) Assistive device: Rolling walker (2 wheeled) Gait Pattern/deviations: Step-through pattern;Decreased step length - right;Decreased step length - left;Decreased stride length;Decreased weight shift to right;Decreased weight shift to left;Shuffle;Leaning posteriorly;Narrow base of support Gait velocity: reduced Gait velocity interpretation: <1.31 ft/sec, indicative of household ambulator General Gait Details: Pt with posterior bias, needing cues to shift weight anteriorly into toes intermittently. Cues for upright posture. ModAx2 for safety with chair follow. Pt bumps into obstacles on his L often, needing cues to attend to and correct.   Stairs  Wheelchair Mobility    Modified Rankin (Stroke Patients Only) Modified Rankin (Stroke Patients Only) Pre-Morbid Rankin Score: No significant disability Modified Rankin: Moderately severe disability     Balance Overall balance assessment: Needs assistance Sitting-balance support: Feet  supported Sitting balance-Leahy Scale: Fair Sitting balance - Comments: Min guard to sit EOB. Postural control: Posterior lean Standing balance support: Bilateral upper extremity supported;During functional activity Standing balance-Leahy Scale: Poor Standing balance comment: Requires UE support and modA.                            Cognition Arousal/Alertness: Awake/alert Behavior During Therapy: WFL for tasks assessed/performed;Flat affect Overall Cognitive Status: Impaired/Different from baseline Area of Impairment: Orientation;Attention;Memory;Following commands;Safety/judgement;Problem solving;Awareness;Rancho level               Rancho Levels of Cognitive Functioning [place] [employer] Scales of Cognitive Functioning: Confused/appropriate Orientation Level: Disoriented to;Place;Time Current Attention Level: Sustained Memory: Decreased short-term memory Following Commands: Follows one step commands consistently (requires increased time to process info and cues due to decreased attention) Safety/Judgement: Decreased awareness of deficits;Decreased awareness of safety Awareness: Intellectual Problem Solving: Slow processing;Decreased initiation;Difficulty sequencing;Requires verbal cues;Requires tactile cues General Comments: Pt oriented to self and situation.  He thinks it's 1966 and he is in [place], despite being re-oriented multiple times cuing pt to verbally copy the info earlier. He is slow to respond to questions, and slow to initiate. Pt with poor problem-solving or spatial awareness, thinking he was going to bathroom while going into the hallway. Pt starting to have bowel movement during gait bout into hall.      Exercises General Exercises - Lower Extremity Straight Leg Raises: Strengthening;Both;10 reps (reclined)    General Comments General comments (skin integrity, edema, etc.): SBP up to low 170s end of session, diastolic in 16X, notfiied RN       Pertinent Vitals/Pain Pain Assessment: Faces Faces Pain Scale: No hurt Pain Intervention(s): Monitored during session    Home Living                      Prior Function            PT Goals (current goals can now be found in the care plan section) Acute Rehab PT Goals Patient Stated Goal: to go to the bathroom PT Goal Formulation: With patient/family Time For Goal Achievement: 08/13/20 Potential to Achieve Goals: Good Progress towards PT goals: Progressing toward goals    Frequency    Min 3X/week      PT Plan Current plan remains appropriate    Co-evaluation              AM-PAC PT "6 Clicks" Mobility   Outcome Measure  Help needed turning from your back to your side while in a flat bed without using bedrails?: A Lot Help needed moving from lying on your back to sitting on the side of a flat bed without using bedrails?: A Lot Help needed moving to and from a bed to a chair (including a wheelchair)?: A Lot Help needed standing up from a chair using your arms (e.g., wheelchair or bedside chair)?: A Lot Help needed to walk in hospital room?: A Lot Help needed climbing 3-5 steps with a railing? : Total 6 Click Score: 11    End of Session Equipment Utilized During Treatment: Gait belt Activity Tolerance: Patient tolerated treatment well Patient left: in chair;with call bell/phone within reach;with chair alarm set;with  restraints reapplied;with family/visitor present Nurse Communication: Mobility status PT Visit Diagnosis: Unsteadiness on feet (R26.81);Other abnormalities of gait and mobility (R26.89);Muscle weakness (generalized) (M62.81);History of falling (Z91.81);Difficulty in walking, not elsewhere classified (R26.2);Other symptoms and signs involving the nervous system (R29.898)     Time: 4709-6283 PT Time Calculation (min) (ACUTE ONLY): 54 min  Charges:  $Gait Training: 8-22 mins $Therapeutic Activity: 38-52 mins                     Moishe Spice, PT, DPT Acute Rehabilitation Services  Pager: [phone] Office: [place] 07/31/2020, 2:18 PM

## 2020-07-31 NOTE — Progress Notes (Signed)
Physical Medicine and Rehabilitation Consult Reason for Consult: Altered mental status Referring Physician: Dr. Sherley Bounds     HPI: [name] [name] is a 80 y.o. right-handed male with history of rheumatic heart disease, atrial fibrillation, diastolic congestive heart failure, aortic valve replacement, mitral valve replacement, dysrhythmia maintained on Tikosyn, prediabetes.  Per chart review patient lives with spouse.  Independent with assistive device.  He is a retired Forensic psychologist.  They have just sold their home and moving to [place] independent living facility on 08/10/2020.  Presented 07/27/2020 with altered mental status after a fall while at church.  Positive loss of consciousness.  Cranial CT scan initially negative and patient decompensated quickly with follow-up showing acute right much larger than left subdural hematomas.  Additional small volume subdural hemorrhage along the falx and tentorium.  Mass-effect including subfalcine herniation with leftward midline shift of 1.8 cm and early trapping of the left lateral ventricle.  Patient underwent right craniotomy for evacuation of subdural hematoma 07/28/2020 per Dr. Sherley Bounds.  He was extubated 07/29/2020.  Decadron protocol as indicated.  Maintained on Keppra for seizure prophylaxis.  Tolerating a regular diet.  Therapy evaluations completed due to patient's decreased functional ability was recommended physical medicine rehab consult.   Wife mentioned pt is a little foggy still, but MUCH better than initially.  Has condom catheter- foley removed yesterday.      Review of Systems  Constitutional: Negative for chills and fever.  HENT: Negative for hearing loss.   Eyes: Negative for blurred vision and double vision.  Respiratory: Negative for cough and shortness of breath.   Cardiovascular: Positive for leg swelling. Negative for chest pain and palpitations.  Gastrointestinal: Positive for constipation. Negative for heartburn,  nausea and vomiting.  Genitourinary: Negative for dysuria, flank pain and hematuria.  Musculoskeletal: Positive for falls.  Skin: Negative for rash.  Neurological: Positive for weakness.  All other systems reviewed and are negative.       Past Medical History:  Diagnosis Date  . Acute rheumatic heart disease, unspecified      childhood, age  48 & 33  . Acute rheumatic pericarditis    . Atrial fibrillation ([place])      history  . CHF (congestive heart failure) ([place])    . Diverticulosis    . Dysrhythmia    . 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 External hemorrhoids without mention of complication    . H/O aortic valve replacement    . H/O mitral valve replacement    . 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 Lesion of ulnar nerve    . 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 Other and unspecified hyperlipidemia    . Pre-diabetes    . Previous back surgery 1978, jan 2007  . Psychosexual dysfunction with inhibited sexual excitement    . SOB (shortness of breath)      "with heavy exercise"  . Stroke ([place]) 08/2013    "I WAS IN AFIB AND THREW A CLOT, THE EFFECTS WERE TRANSITORY AND DIDNT LAST BUT FOR 30 MINUTES"   . Thoracic aortic aneurysm [facility])           Past Surgical History:  Procedure Laterality Date  . AORTIC AND MITRAL VALVE REPLACEMENT        09/2004  . CARDIOVERSION        3 times from 2004-2006  . CARDIOVERSION N/A  09/26/2013    Procedure: CARDIOVERSION;  Surgeon: Larey Dresser, MD;  Location: [place];  Service: Cardiovascular;  Laterality: N/A;  . CARDIOVERSION N/A 06/19/2014    Procedure: CARDIOVERSION;  Surgeon: Jerline Pain, MD;  Location: [place];  Service: Cardiovascular;  Laterality: N/A;  . CARDIOVERSION N/A 04/24/2017    Procedure: CARDIOVERSION;  Surgeon: Larey Dresser, MD;  Location: [facility] ENDOSCOPY;  Service: Cardiovascular;  Laterality: N/A;  . CARDIOVERSION N/A 06/08/2017    Procedure: CARDIOVERSION;  Surgeon: Pixie Casino, MD;   Location: [facility] ENDOSCOPY;  Service: Cardiovascular;  Laterality: N/A;  . CARDIOVERSION N/A 08/24/2017    Procedure: CARDIOVERSION;  Surgeon: Skeet Latch, MD;  Location: [facility] ENDOSCOPY;  Service: Cardiovascular;  Laterality: N/A;  . CARDIOVERSION N/A 02/14/2018    Procedure: CARDIOVERSION;  Surgeon: Larey Dresser, MD;  Location: [facility] ENDOSCOPY;  Service: Cardiovascular;  Laterality: N/A;  . COLONOSCOPY      . CRANIOTOMY N/A 07/28/2020    Procedure: CRANIOTOMY FOR EVACUATION OF SUBDURAL HEMATOMA;  Surgeon: Eustace Scarlata, MD;  Location: [place];  Service: Neurosurgery;  Laterality: N/A;  . laminectomies        10/1975 and in 03/2005  . TONSILLECTOMY        1950  . TOTAL HIP ARTHROPLASTY Right 04/03/2018    Procedure: TOTAL HIP ARTHROPLASTY ANTERIOR APPROACH;  Surgeon: Paralee Cancel, MD;  Location: [facility];  Service: Orthopedics;  Laterality: Right;  70 mins         Family History  Problem Relation Age of Onset  . Macular degeneration Mother          macular degeneration  . Cancer Father          intestinal/GI  . Diabetes Paternal Aunt    . Heart attack Maternal Grandfather    . Diabetes Brother      Social History:  reports that he has never smoked. He has never used smokeless tobacco. He reports that he does not drink alcohol and does not use drugs. Allergies:  Allergies  Allergen Reactions  . Naproxen Hives  . No [employer] Food Allergies Other (See Comments)      Scallops - distress, nausea and vomitting          Medications Prior to Admission  Medication Sig Dispense Refill  . atorvastatin (LIPITOR) 80 MG tablet TAKE 1 TABLET (80 MG TOTAL) BY MOUTH EVERY EVENING. 90 tablet 3  . atorvastatin (LIPITOR) 80 MG tablet Take 1 tablet by mouth daily.      . clobetasol cream (TEMOVATE) 9.76 % Apply 1 application topically daily as needed (for skin irritation).       . Coenzyme Q10 (CO Q-10) 300 MG CAPS Take 300 mg by mouth daily.      [name] COVID-19 mRNA vaccine, Moderna, 100 MCG/0.5ML  injection Inject into the muscle. 0.25 mL 0  . Cyanocobalamin (VITAMIN B-12) 5000 MCG SUBL Place 5,000 mcg under the tongue daily at 2 PM.       . docusate sodium (COLACE) 100 MG capsule Take 100 mg by mouth daily.      [name] dofetilide (TIKOSYN) 500 MCG capsule Take 1 capsule (500 mcg total) by mouth 2 (two) times daily. 180 capsule 3  . dofetilide (TIKOSYN) 500 MCG capsule Take 500 mcg by mouth every 12 (twelve) hours.      . finasteride (PROSCAR) 5 MG tablet Take 5 mg by mouth daily.      . furosemide (LASIX) 20 MG tablet Take 1 tablet (  20 mg total) by mouth daily. 90 tablet 3  . Glucosamine-Chondroitin (COSAMIN DS PO) Take 1 tablet by mouth 2 (two) times daily.      [name] loratadine (CLARITIN) 10 MG tablet Take 10 mg by mouth daily as needed for allergies.       [name] losartan (COZAAR) 25 MG tablet Take 1 tablet (25 mg total) by mouth daily. 90 tablet 3  . losartan (COZAAR) 25 MG tablet Take 25 mg by mouth daily.      . metoprolol succinate (TOPROL-XL) 50 MG 24 hr tablet Take 75 mg (1.5 tabs) by mouth twice daily. Take with or immediately following a meal. 90 tablet 3  . metoprolol succinate (TOPROL-XL) 50 MG 24 hr tablet Take 50 mg by mouth every 12 (twelve) hours.      . Multiple Vitamins-Minerals (MULTIVITAL-M) TABS Take 1 tablet by mouth daily.       . potassium chloride SA (KLOR-CON) 20 MEQ tablet Take 1 tablet (20 mEq total) by mouth daily. 90 tablet 3  . potassium chloride SA (KLOR-CON) 20 MEQ tablet Take 20 mEq by mouth daily.      . Probiotic Product (PROBIOTIC FORMULA PO) Take 1 capsule by mouth 2 (two) times daily.       . tamsulosin (FLOMAX) 0.4 MG CAPS capsule Take 0.4 mg by mouth 2 (two) times daily.      . tamsulosin (FLOMAX) 0.4 MG CAPS capsule Take 0.8 mg by mouth every 12 (twelve) hours.      [name] warfarin (COUMADIN) 5 MG tablet As directed 120 tablet 1  . warfarin (COUMADIN) 5 MG tablet Take 5 mg by mouth daily.          Home: Home Living Family/patient expects to be discharged to::  Unsure Living Arrangements: Spouse/significant other Available Help at Discharge: Family,Available 24 hours/day Home Access: Stairs to enter Additional Comments: Pt and wife just sold their home and are in process of moving to [place] [facility] on 6/6, but are currently transient due to quicker than expected sale of their home  Functional History: Prior Function Level of Independence: Independent with assistive device(s) Comments: Pt utilized a SPC for ambulation.  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[employer] Status:  Mobility: Bed Mobility Overal bed mobility: Needs Assistance Bed Mobility: Supine to Sit Supine to sit: Mod assist,HOB elevated Transfers Overall transfer level: Needs assistance Equipment used: 2 person hand held assist Transfers: Sit to/from [place] to Stand: Mod assist,+2 physical assistance,+2 safety/equipment Stand pivot transfers: Mod assist,+2 physical assistance,+2 safety/equipment General transfer comment: Pt requires assist for balance as he demonstrates a significant posterior bias   ADL: ADL Overall ADL's : Needs assistance/impaired Eating/Feeding: Set up,Supervision/ safety,Sitting Grooming: Wash/dry hands,Wash/dry face,Minimal assistance,Sitting Grooming Details (indicate cue type and reason): assist for sitting balance Upper Body Bathing: Moderate assistance,Sitting Lower Body Bathing: Maximal assistance,Sit to/from stand Upper Body Dressing : Maximal assistance,Sitting Lower Body Dressing: Maximal assistance,Sit to/from stand Lower Body Dressing Details (indicate cue type and reason): min A to don Lt sock and max A for Rt sock Toilet Transfer: Moderate assistance,+2 for physical assistance,+2 for safety/equipment,Stand-pivot,BSC Toileting- Clothing Manipulation and Hygiene: Total assistance,Sit to/from stand Functional mobility during ADLs: Moderate assistance,+2 for safety/equipment,+2 for physical assistance    Cognition: Cognition Overall Cognitive Status: Impaired/Different from baseline Orientation Level: Oriented to person,Oriented to place,Oriented to situation,Disoriented to time [employer] Scales of Cognitive Functioning: Confused/appropriate Cognition Arousal/Alertness: Awake/alert Behavior During Therapy: WFL for tasks assessed/performed,Flat affect Overall Cognitive Status:  Impaired/Different from baseline Area of Impairment: Orientation,Attention,Memory,Following commands,Safety/judgement,Problem solving,Awareness,[place] level Orientation Level: Disoriented to,Place,Time Current Attention Level: Sustained Memory: Decreased short-term memory Following Commands: Follows one step commands consistently (requires increased time to process info and cues due to decreased attention) Safety/Judgement: Decreased awareness of deficits,Decreased awareness of safety Awareness: Intellectual Problem Solving: Slow processing,Decreased initiation,Difficulty sequencing,Requires verbal cues,Requires tactile cues General Comments: Pt oriented to self and situation.  He thinks it's April 2016.   He intermittently knows that he is in  and at [facility] hospital, but in next statement says he's in [place].  He is slow to respond to questions, and slow to initiate.   Blood pressure (!) 142/77, pulse (!) 59, temperature 98.1 F (36.7 C), temperature source Oral, resp. rate (!) 24, height 5\' 8"  (1.727 m), weight 92.7 kg, SpO2 100 %. Physical Exam Vitals and nursing note reviewed. Exam conducted with a chaperone present.  Constitutional:      General: He is not in acute distress.    Comments: Pt is an older gentleman sitting up slightly in bed; wife at bedside- son on phone- confused, but appropriate, NAD  HENT:     Head:     Comments: Craniotomy site is dressed. On R side and L posterior head is covered with dressing from initial fall Staples intact and oozing slightly- drains pulled this AM    Right Ear:  External ear normal.     Left Ear: External ear normal.     Nose: Nose normal. No congestion.     Mouth/Throat:     Mouth: Mucous membranes are dry.     Pharynx: Oropharynx is clear. No oropharyngeal exudate.  Eyes:     General:        Right eye: No discharge.        Left eye: No discharge.     Comments: Kept eyes closed- but talking  Cardiovascular:     Rate and Rhythm: Rhythm irregular.     Comments: Borderline bradycardia- irregular rhythm- no JVD Pulmonary:     Comments: CTA B/L- no W/R/R- good air movement  Abdominal:     Comments: Soft, NT, ND, (+)BS    Genitourinary:    Comments: Condom catheter- medium to dark amber urine Musculoskeletal:     Cervical back: Neck supple. No rigidity.     Comments: Strength at least 5-/5 in all extremities, but hard to comply with exam  Skin:    General: Skin is warm and dry.     Comments: On IVFs- R forearm IV- OK  Neurological:     Comments: Patient is a bit somnolent but arousable.  Makes eye contact with examiner.  Follows some simple commands.  Provided name but would fall back to sleep. Kept falling asleep- thought was in [place] at home, but then with logic, figured out in hospital, but still thought was in [place]- finally got him to know where we were- then thought year was 19-2020; also thought was married 20 years, but been married 40 years per wife.  Of note passed bedside swallow and on regular diet- hasn't eaten first meal yet  Psychiatric:     Comments: Sweet, but lethargic        Lab Results Last 24 Hours       Results for orders placed or performed during the hospital encounter of 07/27/20 (from the past 24 hour(s))  Magnesium     Status: None    Collection Time: 07/30/20  3:02 AM  Result Value Ref Range  Magnesium 2.2 1.7 - 2.4 mg/dL  Basic metabolic panel     Status: Abnormal    Collection Time: 07/30/20  3:02 AM  Result Value Ref Range    Sodium 138 135 - 145 mmol/L    Potassium 3.9 3.5 - 5.1 mmol/L    Chloride  111 98 - 111 mmol/L    CO2 21 (L) 22 - 32 mmol/L    Glucose, Bld 166 (H) 70 - 99 mg/dL    BUN 21 8 - 23 mg/dL    Creatinine, Ser 0.76 0.61 - 1.24 mg/dL    Calcium 8.0 (L) 8.9 - 10.3 mg/dL    GFR, Estimated >60 >60 mL/min    Anion gap 6 5 - 15       Imaging Results (Last 48 hours)  CT HEAD WO CONTRAST   Result Date: 07/29/2020 CLINICAL DATA:  Subdural hemorrhage. EXAM: CT HEAD WITHOUT CONTRAST TECHNIQUE: Contiguous axial images were obtained from the base of the skull through the vertex without intravenous contrast. COMPARISON:  Jul 28, 2020. FINDINGS: Brain: Interval right craniotomy for evacuation of a right subdural hemorrhage with Gel-Foam and small areas of pneumocephalus subjacent to the craniotomy site. Drain is noted, entering at the site of craniotomy and coursing along the right frontal parietal convexity, terminating at the posterior right vertex. Subdural hemorrhage is significantly decreased in size. At the site of craniectomy, extra-axial material measures up to approximately 1 cm; however, this is largely from Gel-Foam with small amount hemorrhage at this site. More anteriorly, subdural hemorrhage measures up to approximately 7 mm in thickness. Mass effect is improved with approximately 5 mm of leftward midline shift at the foramina [place]. Leftward subfalcine herniation is also improved. Basal cisterns are patent. Hyperdense hemorrhage layers along the falx and right tentorial leaflet, not substantially changed. Similar small volume of subarachnoid hemorrhage along the high left frontal parasagittal convexity. Additional site of extra-axial hemorrhage along the left parietotemporal convexity, measuring up to 1 cm in thickness (series 3, image 19) with similar adjacent area of hypodensity. Similar small volume of intraventricular hemorrhage layering in the left occipital horn. No progressive ventriculomegaly. No evidence of interval large vascular territory infarct. Similar punctate  nonspecific areas of mineralization in bilateral frontal lobes. Vascular: No hyperdense vessel identified. Calcific atherosclerosis. Skull: Posterior left scalp contusion without acute fracture. Sinuses/Orbits: Mucosal thickening of the right greater than left maxillary sinuses with frothy secretions in the right maxillary sinus. Other: No mastoid effusions. IMPRESSION: 1. Significantly decreased size of an acute right subdural hemorrhage status post craniotomy and drainage, as detailed above. Improved mass effect with 5 mm of leftward midline shift. 2. Similar subdural hemorrhage along the falx and right tentorial leaflet, small volume high left frontal parasagittal subarachnoid hemorrhage, small volume intraventricular hemorrhage, and additional 1.1 cm thick extra-axial hemorrhage along the left parietotemporal convexity. Electronically Signed   By: Margaretha Sheffield MD   On: 07/29/2020 06:46         Assessment/Plan: Diagnosis: B/L SDHs s/p crani 1. Does the need for close, 24 hr/day medical supervision in concert with the patient's rehab needs make it unreasonable for this patient to be served in a less intensive setting? Yes 2. Co-Morbidities requiring supervision/potential complications: SDHs, afib- not on coumadin currently, CHF, prior CVA, s/p crani; confusion 3. Due to bladder management, bowel management, safety, skin/wound care, disease management, medication administration, pain management and patient education, does the patient require 24 hr/day rehab nursing? Yes 4. Does the patient require coordinated care of a  physician, rehab nurse, therapy disciplines of PT, OT, SLP to address physical and functional deficits in the context of the above medical diagnosis(es)? Yes Addressing deficits in the following areas: balance, endurance, locomotion, strength, transferring, bathing, dressing, feeding, grooming, toileting, cognition and language 5. Can the patient actively participate in an intensive  therapy program of at least 3 hrs of therapy per day at least 5 days per week? Yes 6. The potential for patient to make measurable gains while on inpatient rehab is good 7. Anticipated functional outcomes upon discharge from inpatient rehab are supervision and min assist  with PT, supervision and min assist with OT, supervision and min assist with SLP. 8. Estimated rehab length of stay to reach the above functional goals is: 2-3 weeks 9. Anticipated discharge destination: Home 10. Overall Rehab/Functional Prognosis: good   RECOMMENDATIONS: This patient's condition is appropriate for continued rehabilitative care in the following setting: CIR Patient has agreed to participate in recommended program. Potentially Note that insurance prior authorization may be required for reimbursement for recommended care.   Comment:  1. Pt is an appropriate candidate for inpt rehab-  2. Will d/w admissions coordinators and have insurance sent for approval 3. Of note, still in ICU- will need to know when medically ready since drains were removed this AM 4. Pt needs PT, OT and SLP- still confused, but appropriate     Cathlyn Parsons, PA-C 07/30/2020

## 2020-07-31 NOTE — H&P (Signed)
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 Of note, patient had sustained a fall at church the day before where he had LOC, struck the back of his head, was evaluated in ED with negative CT head.  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  He was intubated for airway protection, Coumadin reversed and  NS consulted for input. Patient had decrebrate posturing with unequal and unreactive pupils and minimal gag reflex. Family elected DNR with comfort care due to poor prognosis but on 05/24, patient was showing improvement with ability to follow some commands and repeat CT head showing persistent bilateral SDH with slightly smaller size and lesser degree of shift.  [name]   He was taken to OR on 05/24 for right craniotomy for evacuation of R-SDH by Dr. Ronnald Ramp. Pharmacy was consulted to manage Tikosyn dose/electrolyte abnormalities--daily EKG X 5 days recommended.   He tolerated extubation without difficulty the next day and therapy evaluations initiated.  Patient noted to be limited by strong posterior bias, visual deficits with difficulty scanning environment, mild sensory deficits, left>right sided weakness as well as poor safety awareness. CIR recommended due to functional deficits.    Review of Systems  Constitutional: Negative for chills and fever.  HENT: Negative for hearing loss.   Eyes: Positive for double vision.  Respiratory: Positive for cough and sputum production.   Cardiovascular: Negative for chest pain and  palpitations.  Gastrointestinal: Negative for abdominal pain, nausea and vomiting.  Genitourinary: Negative for dysuria.  Musculoskeletal: Positive for falls and neck pain.  Skin: Negative for rash.  Neurological: Positive for dizziness, focal weakness and headaches.  Endo/Heme/Allergies: Bruises/bleeds easily.  Psychiatric/Behavioral: Negative for depression and suicidal ideas.      Past Medical History:  Diagnosis Date  . Acute rheumatic heart disease, unspecified    childhood, age  48 & 66  . Acute rheumatic pericarditis   . Atrial fibrillation ([place])    history  . CHF (congestive heart failure) ([place])   . Diverticulosis   . Dysrhythmia   . 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 External hemorrhoids without mention of complication   . H/O aortic valve replacement   . H/O mitral valve replacement   . 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 Lesion of ulnar nerve   . 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 Other and unspecified hyperlipidemia   . Pre-diabetes   . Previous back surgery 1978, jan 2007  . Psychosexual dysfunction with inhibited sexual excitement   . SOB (shortness of breath)    "with heavy exercise"  . Stroke ([place]) 08/2013   "I WAS IN AFIB AND THREW A CLOT, THE EFFECTS WERE TRANSITORY AND DIDNT LAST BUT FOR 30 MINUTES"   . Thoracic aortic aneurysm [facility])     Past Surgical History:  Procedure Laterality Date  . AORTIC AND MITRAL VALVE REPLACEMENT     09/2004  . CARDIOVERSION     3 times from 2004-2006  . CARDIOVERSION N/A 09/26/2013  Procedure: CARDIOVERSION;  Surgeon: Larey Dresser, MD;  Location: [place];  Service: Cardiovascular;  Laterality: N/A;  . CARDIOVERSION N/A 06/19/2014   Procedure: CARDIOVERSION;  Surgeon: Jerline Pain, MD;  Location: [place];  Service: Cardiovascular;  Laterality: N/A;  . CARDIOVERSION N/A 04/24/2017   Procedure: CARDIOVERSION;  Surgeon: Larey Dresser, MD;  Location: [facility] ENDOSCOPY;  Service:  Cardiovascular;  Laterality: N/A;  . CARDIOVERSION N/A 06/08/2017   Procedure: CARDIOVERSION;  Surgeon: Pixie Casino, MD;  Location: [facility] ENDOSCOPY;  Service: Cardiovascular;  Laterality: N/A;  . CARDIOVERSION N/A 08/24/2017   Procedure: CARDIOVERSION;  Surgeon: Skeet Latch, MD;  Location: [facility] ENDOSCOPY;  Service: Cardiovascular;  Laterality: N/A;  . CARDIOVERSION N/A 02/14/2018   Procedure: CARDIOVERSION;  Surgeon: Larey Dresser, MD;  Location: [facility] ENDOSCOPY;  Service: Cardiovascular;  Laterality: N/A;  . COLONOSCOPY    . CRANIOTOMY N/A 07/28/2020   Procedure: CRANIOTOMY FOR EVACUATION OF SUBDURAL HEMATOMA;  Surgeon: Eustace Dech, MD;  Location: [place];  Service: Neurosurgery;  Laterality: N/A;  . laminectomies     10/1975 and in 03/2005  . TONSILLECTOMY     1950  . TOTAL HIP ARTHROPLASTY Right 04/03/2018   Procedure: TOTAL HIP ARTHROPLASTY ANTERIOR APPROACH;  Surgeon: Paralee Cancel, MD;  Location: [facility];  Service: Orthopedics;  Laterality: Right;  70 mins    Family History  Problem Relation Age of Onset  . Macular degeneration Mother        macular degeneration  . Cancer Father        intestinal/GI  . Diabetes Paternal Aunt   . Heart attack Maternal Grandfather   . Diabetes Brother     Social History:  Married. 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 Active and in process of moving to [facility] on 06/06.  Per reports that he has never smoked. He has never used smokeless tobacco. Per reports uses alcohol and does not use drugs.   Allergies  Allergen Reactions  . Naproxen Hives  . No [employer] Food Allergies Other (See Comments)    Scallops - distress, nausea and vomitting    Medications Prior to Admission  Medication Sig Dispense Refill  . atorvastatin (LIPITOR) 80 MG tablet TAKE 1 TABLET (80 MG TOTAL) BY MOUTH EVERY EVENING. 90 tablet 3  . atorvastatin (LIPITOR) 80 MG tablet Take 1 tablet by mouth daily.    . clobetasol cream (TEMOVATE) 9.50 % Apply 1 application  topically daily as needed (for skin irritation).     . Coenzyme Q10 (CO Q-10) 300 MG CAPS Take 300 mg by mouth daily.    [name] COVID-19 mRNA vaccine, Moderna, 100 MCG/0.5ML injection Inject into the muscle. 0.25 mL 0  . Cyanocobalamin (VITAMIN B-12) 5000 MCG SUBL Place 5,000 mcg under the tongue daily at 2 PM.     . docusate sodium (COLACE) 100 MG capsule Take 100 mg by mouth daily.    [name] dofetilide (TIKOSYN) 500 MCG capsule Take 1 capsule (500 mcg total) by mouth 2 (two) times daily. 180 capsule 3  . dofetilide (TIKOSYN) 500 MCG capsule Take 500 mcg by mouth every 12 (twelve) hours.    . finasteride (PROSCAR) 5 MG tablet Take 5 mg by mouth daily.    . furosemide (LASIX) 20 MG tablet Take 1 tablet (20 mg total) by mouth daily. 90 tablet 3  . Glucosamine-Chondroitin (COSAMIN DS PO) Take 1 tablet by mouth 2 (two) times daily.    [name] loratadine (CLARITIN) 10 MG tablet Take 10 mg by  mouth daily as needed for allergies.     [name] losartan (COZAAR) 25 MG tablet Take 1 tablet (25 mg total) by mouth daily. 90 tablet 3  . losartan (COZAAR) 25 MG tablet Take 25 mg by mouth daily.    . metoprolol succinate (TOPROL-XL) 50 MG 24 hr tablet Take 75 mg (1.5 tabs) by mouth twice daily. Take with or immediately following a meal. 90 tablet 3  . metoprolol succinate (TOPROL-XL) 50 MG 24 hr tablet Take 50 mg by mouth every 12 (twelve) hours.    . Multiple Vitamins-Minerals (MULTIVITAL-M) TABS Take 1 tablet by mouth daily.     . potassium chloride SA (KLOR-CON) 20 MEQ tablet Take 1 tablet (20 mEq total) by mouth daily. 90 tablet 3  . potassium chloride SA (KLOR-CON) 20 MEQ tablet Take 20 mEq by mouth daily.    . Probiotic Product (PROBIOTIC FORMULA PO) Take 1 capsule by mouth 2 (two) times daily.     . tamsulosin (FLOMAX) 0.4 MG CAPS capsule Take 0.4 mg by mouth 2 (two) times daily.    . tamsulosin (FLOMAX) 0.4 MG CAPS capsule Take 0.8 mg by mouth every 12 (twelve) hours.    [name] warfarin (COUMADIN) 5 MG tablet As directed 120  tablet 1  . warfarin (COUMADIN) 5 MG tablet Take 5 mg by mouth daily.      Drug Regimen Review  Drug regimen was reviewed and remains appropriate with no significant issues identified  Home: Home Living Family/patient expects to be discharged to:: Unsure Living Arrangements: Spouse/significant other Available Help at Discharge: Family,Available 24 hours/day Home Access: Stairs to enter Additional Comments: Pt and wife just sold their home and are in process of moving to [facility] on 6/6, but are currently transient due to quicker than expected sale of their home   Functional History: Prior Function Level of Independence: Independent with assistive device(s) Comments: Pt utilized a SPC for ambulation.  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 Pt requesting therapist's hand to pull trunk to ascend at EOB. Transfers Overall transfer level: Needs assistance Equipment used: 2 person hand held assist Transfers: Sit to/from [place] to Stand: Mod assist,+2 physical assistance,+2 safety/equipment Stand pivot transfers: Mod assist,+2 physical assistance,+2 safety/equipment General transfer comment: Pt requires assist for balance as he demonstrates a significant posterior bias. ModAx2 with UE support to steady and direct pt with coming to stand and taking side steps to transfer bed > chair, chair > commode, commode > chair. Ambulation/Gait Ambulation/Gait assistance: Mod assist,+2 physical assistance,+2 safety/equipment Gait Distance (Feet): 15 Feet (x3 bouts of ~15 ft > ~3 ft > ~3 ft) Assistive device: 2 person hand held assist Gait Pattern/deviations: Step-through pattern,Decreased step length -  right,Decreased step length - left,Decreased stride length,Decreased weight shift to right,Decreased weight shift to left,Shuffle,Leaning posteriorly,Narrow base of support General Gait Details: Pt with strong posterior bias, needing cues to shift weight anteriorly into toes intermittently. Cues for upright posture. ModAx2 with bil HHA to direct and sequence weight shoft to facilitate steps anteriorly, posteriorly, and laterally. Gait velocity: reduced Gait velocity interpretation: <1.31 ft/sec, indicative of household ambulator    ADL: ADL Overall ADL's : Needs assistance/impaired Eating/Feeding: Set up,Supervision/ safety,Sitting Grooming: Wash/dry hands,Wash/dry face,Minimal assistance,Sitting Grooming Details (indicate cue  type and reason): assist for sitting balance Upper Body Bathing: Moderate assistance,Sitting Lower Body Bathing: Maximal assistance,Sit to/from stand Upper Body Dressing : Maximal assistance,Sitting Lower Body Dressing: Maximal assistance,Sit to/from stand Lower Body Dressing Details (indicate cue type and reason): min A to don Lt sock and max A for Rt sock Toilet Transfer: Moderate assistance,+2 for physical assistance,+2 for safety/equipment,Stand-pivot,BSC Toileting- Clothing Manipulation and Hygiene: Total assistance,Sit to/from stand Functional mobility during ADLs: Moderate assistance,+2 for safety/equipment,+2 for physical assistance  Cognition: Cognition Overall Cognitive Status: Impaired/Different from baseline Orientation Level: Oriented to person,Disoriented to place,Disoriented to time,Disoriented to situation [employer] Scales of Cognitive Functioning: Confused/appropriate Cognition Arousal/Alertness: Awake/alert Behavior During Therapy: WFL for tasks assessed/performed,Flat affect Overall Cognitive Status: Impaired/Different from baseline Area of Impairment: Orientation,Attention,Memory,Following commands,Safety/judgement,Problem  solving,Awareness,[place] level Orientation Level: Disoriented to,Place,Time Current Attention Level: Sustained Memory: Decreased short-term memory Following Commands: Follows one step commands consistently (requires increased time to process info and cues due to decreased attention) Safety/Judgement: Decreased awareness of deficits,Decreased awareness of safety Awareness: Intellectual Problem Solving: Slow processing,Decreased initiation,Difficulty sequencing,Requires verbal cues,Requires tactile cues General Comments: Pt oriented to self and situation.  He thinks it's April 2016.   He intermittently knows that he is in [place] and at [facility] hospital, but in next statement says he's in [place].  He is slow to respond to questions, and slow to initiate. Pt unable to perform basic math skills. Pt with no awareness into his deficits or safety, stating he thinks he did well and was close to his normal.   Blood pressure (!) 158/83, pulse 61, temperature (!) 97.5 F (36.4 C), temperature source Axillary, resp. rate 19, height 5\' 8"  (1.727 m), weight 92.7 kg, SpO2 98 %. Physical Exam Constitutional:      General: He is not in acute distress. HENT:     Head:     Comments: Right crani site CDI with scab, staples, abrasion on occiput with foam dressing    Nose: Nose normal.     Mouth/Throat:     Mouth: Mucous membranes are moist.  Eyes:     Pupils: Pupils are equal, round, and reactive to light.     Comments: Doesn't track as well to the right  Cardiovascular:     Rate and Rhythm: Regular rhythm. Bradycardia present.     Pulses: Normal pulses.     Heart sounds: No murmur heard. No gallop.   Pulmonary:     Effort: Pulmonary effort is normal. No respiratory distress.     Breath sounds: No wheezing.  Abdominal:     General: Bowel sounds are normal. There is distension.     Palpations: Abdomen is soft.     Tenderness: There is no abdominal tenderness.  Musculoskeletal:        General: No swelling or  tenderness.     Cervical back: Rigidity present.  Skin:    General: Skin is warm.     Comments: Scattered bruises bilateral UE's  Neurological:     Mental Status: He is alert and oriented to person, place, and time.     Comments: Fair insight and awareness. Speech generally clear. Normal language. STM fair. Doesn't track as well to right but no focal CN abnormalities. UE 3+ to 4/5. R>L, decreased [place] LUE, depth perception abnl.[name] BLE 3+ to 4/5 also. No focal sensory abnl.   Psychiatric:        Mood and Affect: Mood normal.        Behavior: Behavior normal.  Results for orders placed or performed during the hospital encounter of 07/27/20 (from the past 48 hour(s))  Magnesium     Status: None   Collection Time: 07/30/20  3:02 AM  Result Value Ref Range   Magnesium 2.2 1.7 - 2.4 mg/dL    Comment: Performed at [facility], [place] [address] [place], [place] [address]  Basic metabolic panel     Status: Abnormal   Collection Time: 07/30/20  3:02 AM  Result Value Ref Range   Sodium 138 135 - 145 mmol/L   Potassium 3.9 3.5 - 5.1 mmol/L   Chloride 111 98 - 111 mmol/L   CO2 21 (L) 22 - 32 mmol/L   Glucose, Bld 166 (H) 70 - 99 mg/dL    Comment: Glucose reference range applies only to samples taken after fasting for at least 8 hours.   BUN 21 8 - 23 mg/dL   Creatinine, Ser 0.76 0.61 - 1.24 mg/dL   Calcium 8.0 (L) 8.9 - 10.3 mg/dL   GFR, Estimated >60 >60 mL/min    Comment: (NOTE) Calculated using the CKD-EPI Creatinine Equation (2021)    Anion gap 6 5 - 15    Comment: Performed at [facility] [address], [place], [place] [address]  Triglycerides     Status: None   Collection Time: 07/31/20  4:45 AM  Result Value Ref Range   Triglycerides 70 <150 mg/dL    Comment: Performed at [facility] [address] [place], [place] [address]  Magnesium     Status: None   Collection Time: 07/31/20  4:45 AM  Result Value Ref Range   Magnesium 2.4 1.7 - 2.4 mg/dL     Comment: Performed at [facility] [address] [place], [place] [address]  Basic metabolic panel     Status: Abnormal   Collection Time: 07/31/20  4:45 AM  Result Value Ref Range   Sodium 139 135 - 145 mmol/L   Potassium 4.1 3.5 - 5.1 mmol/L   Chloride 112 (H) 98 - 111 mmol/L   CO2 22 22 - 32 mmol/L   Glucose, Bld 141 (H) 70 - 99 mg/dL    Comment: Glucose reference range applies only to samples taken after fasting for at least 8 hours.   BUN 29 (H) 8 - 23 mg/dL   Creatinine, Ser 0.74 0.61 - 1.24 mg/dL   Calcium 8.4 (L) 8.9 - 10.3 mg/dL   GFR, Estimated >60 >60 mL/min    Comment: (NOTE) Calculated using the CKD-EPI Creatinine Equation (2021)    Anion gap 5 5 - 15    Comment: Performed at [facility] [address], [place], [place] [address]   No results found.     Medical Problem List and Plan: 1.  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 Pt s/p Right-sided craniotomy 5/24  -patient may shower  -ELOS/Goals: 18-21 days, supervision to min assist goals with PT, OT, SLP 2.  Antithrombotics: -DVT/anticoagulation:  Mechanical: Sequential compression devices, below knee Bilateral lower extremities-pt to resume coumadin at some point over the next week or so. Will f/u with NS next week.  -antiplatelet therapy: N/A 3. Pain Management: Hydrocodone prn.  4. Mood: LCSW to follow for evaluation and support.   -antipsychotic agents: NA 5. Neuropsych: This patient is not capable of making decisions on his own behalf. 6. Skin/Wound Care: Routine pressure relief measures.  7. Fluids/Electrolytes/Nutrition: Monitor I/O. Check lytes in  am; 8. Atypical Atrial flutter: On Toprol, Tikosyn and coumadin PTA--coumadin on hold at present  --monitor HR tid. Need to Keep K+>4.0 and Mg.1.8.  -monitor for syncopal episodes, symptomatic arhythmia   -cards f/u as needed 9. HTN/AAA 5.1 cm/Thoracic aortic aneurysm 5cm: BP control important w/SBP  goal<160.[name]   --Monitor BP tid--has been elevated and labile likely due to decadron on board.   --continue Lasix, Cozaar, metoprolol and prn hydralazine.   10. BPH: Resume Flomax and Proscar.  11. Diastolic CHF: Monitor weights daily and for any signs of overload.   --continue Lasix, hydralazine and Lipitor.  12. Pre-diabetes: Hgb A1c-6.3 07/2019. Will monitor BS ac/hs while on steroids and use SSI for elevated BS.   --Add dietary restrictions.  13. Acute blood loss anemia: Recheck CBC in am.         Bary Leriche, PA-C 07/31/2020

## 2020-07-31 NOTE — Progress Notes (Signed)
Pt's BP >160, unable to give PRN labetalol due to HR. Called MD Sherley Bounds: verbal order obtained to administer one time dose hydalazine 5 mg IV.

## 2020-07-31 NOTE — Progress Notes (Signed)
Initial assessment patient resting in semi fowler position eating dinner wife and son at bedside. Denies pain at this time, respiration , coloration    [id number] Follow up call from wife questions regarding current medication to be taken with food especially his cardiac medication

## 2020-08-01 DIAGNOSIS — I10 Essential (primary) hypertension: Secondary | ICD-10-CM

## 2020-08-01 DIAGNOSIS — S065X2S Traumatic subdural hemorrhage with loss of consciousness of 31 minutes to 59 minutes, sequela: Secondary | ICD-10-CM

## 2020-08-01 DIAGNOSIS — S065X3S Traumatic subdural hemorrhage with loss of consciousness of 1 hour to 5 hours 59 minutes, sequela: Secondary | ICD-10-CM

## 2020-08-01 DIAGNOSIS — E876 Hypokalemia: Secondary | ICD-10-CM

## 2020-08-01 LAB — BPAM RBC
Blood Product Expiration Date: 202205262359
Blood Product Expiration Date: 202206042359
Blood Product Expiration Date: 202206102359
Blood Product Expiration Date: 202206102359
ISSUE DATE / TIME: 202205241629
ISSUE DATE / TIME: 202205241629
ISSUE DATE / TIME: 202205241629
ISSUE DATE / TIME: 202205260042
Unit Type and Rh: 6200
Unit Type and Rh: 6200
Unit Type and Rh: 6200
Unit Type and Rh: 6200

## 2020-08-01 LAB — TYPE AND SCREEN
ABO/RH(D): A POS
Antibody Screen: NEGATIVE
Unit division: 0
Unit division: 0
Unit division: 0
Unit division: 0

## 2020-08-01 LAB — CBC WITH DIFFERENTIAL/PLATELET
Abs Immature Granulocytes: 0.09 10*3/uL — ABNORMAL HIGH (ref 0.00–0.07)
Basophils Absolute: 0 10*3/uL (ref 0.0–0.1)
Basophils Relative: 0 %
Eosinophils Absolute: 0 10*3/uL (ref 0.0–0.5)
Eosinophils Relative: 0 %
HCT: 31.1 % — ABNORMAL LOW (ref 39.0–52.0)
Hemoglobin: 10.4 g/dL — ABNORMAL LOW (ref 13.0–17.0)
Immature Granulocytes: 1 %
Lymphocytes Relative: 6 %
Lymphs Abs: 0.8 10*3/uL (ref 0.7–4.0)
MCH: 31.4 pg (ref 26.0–34.0)
MCHC: 33.4 g/dL (ref 30.0–36.0)
MCV: 94 fL (ref 80.0–100.0)
Monocytes Absolute: 0.7 10*3/uL (ref 0.1–1.0)
Monocytes Relative: 6 %
Neutro Abs: 11.7 10*3/uL — ABNORMAL HIGH (ref 1.7–7.7)
Neutrophils Relative %: 87 %
Platelets: 214 10*3/uL (ref 150–400)
RBC: 3.31 MIL/uL — ABNORMAL LOW (ref 4.22–5.81)
RDW: 14.2 % (ref 11.5–15.5)
WBC: 13.4 10*3/uL — ABNORMAL HIGH (ref 4.0–10.5)
nRBC: 0 % (ref 0.0–0.2)

## 2020-08-01 LAB — COMPREHENSIVE METABOLIC PANEL
ALT: 52 U/L — ABNORMAL HIGH (ref 0–44)
AST: 39 U/L (ref 15–41)
Albumin: 2.8 g/dL — ABNORMAL LOW (ref 3.5–5.0)
Alkaline Phosphatase: 46 U/L (ref 38–126)
Anion gap: 8 (ref 5–15)
BUN: 26 mg/dL — ABNORMAL HIGH (ref 8–23)
CO2: 21 mmol/L — ABNORMAL LOW (ref 22–32)
Calcium: 8.6 mg/dL — ABNORMAL LOW (ref 8.9–10.3)
Chloride: 109 mmol/L (ref 98–111)
Creatinine, Ser: 0.74 mg/dL (ref 0.61–1.24)
GFR, Estimated: >60 mL/min (ref >60)
Glucose, Bld: 145 mg/dL — ABNORMAL HIGH (ref 70–99)
Potassium: 4.1 mmol/L (ref 3.5–5.1)
Sodium: 138 mmol/L (ref 135–145)
Total Bilirubin: 1.1 mg/dL (ref 0.3–1.2)
Total Protein: 5.9 g/dL — ABNORMAL LOW (ref 6.5–8.1)

## 2020-08-01 LAB — GLUCOSE, CAPILLARY
Glucose-Capillary: 147 mg/dL — ABNORMAL HIGH (ref 70–99)
Glucose-Capillary: 135 mg/dL — ABNORMAL HIGH (ref 70–99)
Glucose-Capillary: 133 mg/dL — ABNORMAL HIGH (ref 70–99)
Glucose-Capillary: 123 mg/dL — ABNORMAL HIGH (ref 70–99)
Glucose-Capillary: 172 mg/dL — ABNORMAL HIGH (ref 70–99)

## 2020-08-01 LAB — HEMOGLOBIN A1C
Hgb A1c MFr Bld: 6.2 % — ABNORMAL HIGH (ref 4.8–5.6)
Mean Plasma Glucose: 131 mg/dL

## 2020-08-01 LAB — MAGNESIUM: Magnesium: 2.2 mg/dL (ref 1.7–2.4)

## 2020-08-01 MED ORDER — ATORVASTATIN CALCIUM 80 MG PO TABS
80.0000 mg | ORAL_TABLET | Freq: Every day | ORAL | Status: DC
  Administered 2020-08-02 – 2020-08-06 (×5): 80 mg via ORAL
  Filled 2020-08-01 (×6): qty 1

## 2020-08-01 NOTE — Progress Notes (Addendum)
PROGRESS NOTE   Subjective/Complaints: Appears to have had a fairly uneventful night. Side lying in bed when I came today. Awoke for me.   ROS: Limited due to cognitive/behavioral    Objective:   No results found. Recent Labs    08/01/20 0610  WBC 13.4*  HGB 10.4*  HCT 31.1*  PLT 214   Recent Labs    07/31/20 0445 08/01/20 0610  NA 139 138  K 4.1 4.1  CL 112* 109  CO2 22 21*  GLUCOSE 141* 145*  BUN 29* 26*  CREATININE 0.74 0.74  CALCIUM 8.4* 8.6*    Intake/Output Summary (Last 24 hours) at 08/01/2020 1001 Last data filed at 08/01/2020 0200 Gross per 24 hour  Intake --  Output 900 ml  Net -900 ml        Physical Exam: Vital Signs Blood pressure (!) 157/79, pulse (!) 58, temperature 97.6 F (36.4 C), temperature source Oral, resp. rate 16, SpO2 99 %.  General: Alert and oriented x 3, No apparent distress HEENT: right crani incision, occipital wound,  Neck: Supple without JVD or lymphadenopathy Heart: Reg rate and rhythm. No murmurs rubs or gallops Chest: CTA bilaterally without wheezes, rales, or rhonchi; no distress Abdomen: Soft, non-tender, non-distended, bowel sounds positive. Extremities: No clubbing, cyanosis, or edema. Pulses are 2+ Psych: Pt's affect is appropriate. Pt is cooperative Skin: Clean and intact without signs of breakdown Neuro:arouses. Speech clear. LUE and LLE 3/5. RUE and RLE 4-/5. Decreased FMC LUE, impaired depth perception.   Musculoskeletal: cervical discomfort     Assessment/Plan: 1. Functional deficits which require 3+ hours per day of interdisciplinary therapy in a comprehensive inpatient rehab setting.  Physiatrist is providing close team supervision and 24 hour management of active medical problems listed below.  Physiatrist and rehab team continue to assess barriers to discharge/monitor patient progress toward functional and medical goals  Care Tool:  Bathing         Body parts bathed by helper: Buttocks,Front perineal area     Bathing assist Assist Level: Total Assistance - Patient < 25%     Upper Body Dressing/Undressing Upper body dressing   What is the patient wearing?: Hospital gown only    Upper body assist Assist Level: Maximal Assistance - Patient 25 - 49%    Lower Body Dressing/Undressing Lower body dressing      What is the patient wearing?: Hospital gown only,Incontinence brief     Lower body assist Assist for lower body dressing: Maximal Assistance - Patient 25 - 49%     Toileting Toileting    Toileting assist Assist for toileting: Total Assistance - Patient < 25%     Transfers Chair/bed transfer  Transfers assist     Chair/bed transfer assist level: 2 Helpers     Locomotion Ambulation   Ambulation assist              Walk 10 feet activity   Assist           Walk 50 feet activity   Assist           Walk 150 feet activity   Assist  Walk 10 feet on uneven surface  activity   Assist           Wheelchair     Assist               Wheelchair 50 feet with 2 turns activity    Assist            Wheelchair 150 feet activity     Assist          Blood pressure (!) 157/79, pulse (!) 58, temperature 97.6 F (36.4 C), temperature source Oral, resp. rate 16, SpO2 99 %.  Medical Problem List and Plan: 1.  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 Pt s/p Right-sided craniotomy 5/24             -patient may shower             -ELOS/Goals: 18-21 days, supervision to min assist goals with PT, OT, SLP  -Patient is beginning CIR therapies today including PT, OT, and SLP  2.  Antithrombotics: -DVT/anticoagulation:  Mechanical: Sequential compression devices, below knee Bilateral lower extremities-pt to resume coumadin at some point over the next week or so. Will f/u with NS next week.             -antiplatelet  therapy: N/A 3. Pain Management: Hydrocodone prn.  4. Mood: LCSW to follow for evaluation and support.              -antipsychotic agents: NA 5. Neuropsych: This patient is not capable of making decisions on his own behalf. 6. Skin/Wound Care: Routine pressure relief measures.  7. Fluids/Electrolytes/Nutrition: I personally reviewed the patient's labs today.    -encourage fluids 8. Atypical Atrial flutter: On Toprol, Tikosyn and coumadin PTA--coumadin on hold at present             --monitor HR tid. Need to Keep K+>4.0 and Mg.1.8.--K+ within range--recheck Monday   -mg++ pending today             -monitor for syncopal episodes, symptomatic arhythmia              -cards f/u as needed 9. 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              --continue Lasix, Cozaar, metoprolol and prn hydralazine.    -5/28 fair control 10. BPH: Resume Flomax and Proscar.  11. Diastolic CHF: Monitor weights daily and for any signs of overload.              --continue Lasix, hydralazine and Lipitor.  12. Pre-diabetes: Hgb A1c-6.3 07/2019. Will monitor BS ac/hs while on steroids and use SSI for elevated BS.   -steroid related             --Added dietary restrictions.  13. Acute blood loss anemia: hgb 10.4 5/28 14. Leukocytosis: steroid related-no signs of infection      LOS: 1 days A FACE TO FACE EVALUATION WAS PERFORMED  Meredith Staggers 08/01/2020, 10:01 AM

## 2020-08-01 NOTE — Evaluation (Signed)
Occupational Therapy Assessment and Plan  Patient Details  Name: [name] [name] MRN: [id number] Date of Birth: 28-Sep-1940  OT Diagnosis: abnormal posture, altered mental status, cognitive deficits, hemiplegia affecting non-dominant side and muscle weakness (generalized) Rehab Potential: Rehab Potential (ACUTE ONLY): Excellent ELOS: 14-17 days   Today's Date: 08/01/2020 OT Individual Time: 0800-0904 OT Individual Time Calculation (min): 64 min     Hospital Problem: Principal Problem:   Subdural hemorrhage following injury [facility])   Past Medical History:  Past Medical History:  Diagnosis Date  . Acute rheumatic heart disease, unspecified    childhood, age  38 & 10  . Acute rheumatic pericarditis   . Atrial fibrillation ([place])    history  . CHF (congestive heart failure) ([place])   . Diverticulosis   . Dysrhythmia   . 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 External hemorrhoids without mention of complication   . H/O aortic valve replacement   . H/O mitral valve replacement   . 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 Lesion of ulnar nerve   . 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 Other and unspecified hyperlipidemia   . Pre-diabetes   . Previous back surgery 1978, jan 2007  . Psychosexual dysfunction with inhibited sexual excitement   . SOB (shortness of breath)    "with heavy exercise"  . Stroke ([place]) 08/2013   "I WAS IN AFIB AND THREW A CLOT, THE EFFECTS WERE TRANSITORY AND DIDNT LAST BUT FOR 30 MINUTES"   . Thoracic aortic aneurysm [facility])    Past Surgical History:  Past Surgical History:  Procedure Laterality Date  . AORTIC AND MITRAL VALVE REPLACEMENT     09/2004  . CARDIOVERSION     3 times from 2004-2006  . CARDIOVERSION N/A 09/26/2013   Procedure: CARDIOVERSION;  Surgeon: Larey Dresser, MD;  Location: [place];  Service: Cardiovascular;  Laterality: N/A;  . CARDIOVERSION N/A 06/19/2014   Procedure: CARDIOVERSION;  Surgeon: Jerline Pain, MD;  Location:  [place];  Service: Cardiovascular;  Laterality: N/A;  . CARDIOVERSION N/A 04/24/2017   Procedure: CARDIOVERSION;  Surgeon: Larey Dresser, MD;  Location: [facility] ENDOSCOPY;  Service: Cardiovascular;  Laterality: N/A;  . CARDIOVERSION N/A 06/08/2017   Procedure: CARDIOVERSION;  Surgeon: Pixie Casino, MD;  Location: [facility] ENDOSCOPY;  Service: Cardiovascular;  Laterality: N/A;  . CARDIOVERSION N/A 08/24/2017   Procedure: CARDIOVERSION;  Surgeon: Skeet Latch, MD;  Location: [facility] ENDOSCOPY;  Service: Cardiovascular;  Laterality: N/A;  . CARDIOVERSION N/A 02/14/2018   Procedure: CARDIOVERSION;  Surgeon: Larey Dresser, MD;  Location: [facility] ENDOSCOPY;  Service: Cardiovascular;  Laterality: N/A;  . COLONOSCOPY    . CRANIOTOMY N/A 07/28/2020   Procedure: CRANIOTOMY FOR EVACUATION OF SUBDURAL HEMATOMA;  Surgeon: Eustace Bilyeu, MD;  Location: [place];  Service: Neurosurgery;  Laterality: N/A;  . laminectomies     10/1975 and in 03/2005  . TONSILLECTOMY     1950  . TOTAL HIP ARTHROPLASTY Right 04/03/2018   Procedure: TOTAL HIP ARTHROPLASTY ANTERIOR APPROACH;  Surgeon: Paralee Cancel, MD;  Location: [facility] ORS;  Service: Orthopedics;  Laterality: Right;  70 mins    Assessment & Plan Clinical Impression: Patient is a 80 y.o. year old male with recent admission to the hospital on 07/27/20 after waking up with difficulty speaking, decreased movement on right sided. Of note, patient had sustained a fall at church the day before where he had LOC, struck the back of his head,  was evaluated in ED with negative CT head. 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 He was intubated for airway protection, Coumadin reversed and NS consulted for input. Patient had decrebrate posturing with unequal and unreactive pupils and minimal gag reflex. Family elected DNR with  comfort care due to poor prognosis but on 05/24, patient was showing improvement with ability to follow some commands and repeat CT head showing persistent bilateral SDH with slightly smaller size and lesser degree of shift. [name] [name]   He was taken to OR on 05/24 for right craniotomy for evacuation of R-SDH by Dr. [name]  Patient transferred to [facility] on 07/31/2020 .    Patient currently requires mod with basic self-care skills secondary to muscle weakness and muscle paralysis, impaired timing and sequencing, unbalanced muscle activation and decreased coordination, decreased visual motor skills and field cut, decreased attention to left, decreased attention, decreased awareness, decreased problem solving, decreased safety awareness, decreased memory and delayed processing and decreased sitting balance, decreased standing balance, decreased postural control, hemiplegia and decreased balance strategies.  Prior to hospitalization, patient could complete ADLs with independent .  Patient will benefit from skilled intervention to decrease level of assist with basic self-care skills and increase independence with basic self-care skills prior to discharge home with care partner.  Anticipate patient will require 24 hour supervision and follow up home health.  OT - End of Session Activity Tolerance: Decreased this session Endurance Deficit: Yes Endurance Deficit Description: Pt requesting to lay down after completion of ADL tasks secondary to fatigue. OT Assessment Rehab Potential (ACUTE ONLY): Excellent OT Patient demonstrates impairments in the following area(s): Balance;Cognition;Endurance;Motor;Vision;Sensory;Safety;Perception OT Basic ADL's Functional Problem(s): Grooming;Bathing;Dressing;Toileting OT Transfers Functional Problem(s): Toilet;Tub/Shower OT Additional Impairment(s): Fuctional Use of Upper Extremity OT Plan OT Intensity: Minimum of 1-2 x/day, 45 to 90 minutes OT Frequency: 5 out of 7 days OT  Duration/Estimated Length of Stay: 14-17 days OT Treatment/Interventions: Balance/vestibular training;Cognitive remediation/compensation;Community reintegration;DME/adaptive equipment instruction;Discharge planning;Functional electrical stimulation;Functional mobility training;Neuromuscular re-education;Patient/family education;Pain management;Self Care/advanced ADL retraining;Therapeutic Activities;UE/LE Coordination activities;Therapeutic Exercise;UE/LE Strength taining/ROM;Visual/perceptual remediation/compensation OT Self Feeding Anticipated Outcome(s): independent OT Basic Self-Care Anticipated Outcome(s): supervision OT Toileting Anticipated Outcome(s): supervision OT Bathroom Transfers Anticipated Outcome(s): supervision OT Recommendation Recommendations for Other Services: Neuropsych consult Patient destination: Home Follow Up Recommendations: 24 hour supervision/assistance;Home health OT Equipment Recommended: To be determined   OT Evaluation Precautions/Restrictions  Precautions Precautions: Fall Precaution Comments: posey belt, mild LUE and LLE weakness, confusion Restrictions Weight Bearing Restrictions: No   Pain Pain Assessment Pain Scale: 0-10 Pain Score: 0-No pain Home Living/Prior Functioning Home Living Family/patient expects to be discharged to:: Private residence Living Arrangements: Spouse/significant other Available Help at Discharge: Family,Available 24 hours/day Type of Home: Apartment Home Layout: One level Bathroom Shower/Tub: Health visitor: Standard Additional Comments: Pt and wife just sold their home and are in process of moving to [place] [facility] on 6/6, but are currently transient due to quicker than expected sale of their home  Lives With: Spouse IADL History Homemaking Responsibilities: Yes Meal Prep Responsibility: Secondary Laundry Responsibility: Secondary Cleaning Responsibility: Secondary Current License: Yes Education:  Law school Occupation: Retired 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[place] Baseline Vision/History: Wears glasses Wears Glasses: Reading only Patient Visual Report: No change from baseline Vision Assessment?: Yes Eye Alignment: Within Functional Limits Tracking/Visual Pursuits: Decreased smoothness of horizontal tracking;Decreased smoothness of vertical tracking;Other (comment) (Lost fixation when tracking to the left, but inconsistent seconddary to fatigue and pt slowly closing his eyes.  Will continue to assess in treatment.) Convergence: Impaired - to be further tested in functional context Diplopia Assessment:  (With gross assessment he was able to correctly identify therapist's fingers moving in both lateral fields, but will need to be further examined.) Perception  Perception: Impaired Inattention/Neglect: Does not attend to left side of body Praxis Praxis: Intact Cognition Overall Cognitive Status: Impaired/Different from baseline Arousal/Alertness: Awake/alert Orientation Level: Person;Place;Situation Person: Disoriented Place: Disoriented Situation: Oriented (able to state fall and brain bleed) Year:  (1966) Month: July Day of Week: Incorrect Memory: Impaired Memory Impairment: Decreased recall of new information;Decreased short term memory Decreased Short Term Memory: Verbal basic Immediate Memory Recall: Sock;Blue;Bed Memory Recall Sock: Not able to recall Memory Recall Blue: Not able to recall Memory Recall Bed: Without Cue Attention: Focused;Sustained;Selective Focused Attention: Appears intact Sustained Attention: Impaired Sustained Attention Impairment: Functional basic Selective  Attention: Impaired Selective Attention Impairment: Functional basic Awareness: Impaired Awareness Impairment: Emergent impairment;Anticipatory impairment Problem Solving: Impaired Executive Function: Sequencing;Organizing Sequencing: Impaired Sequencing Impairment: Event organiser: Impaired Organizing Impairment: Verbal basic Safety/Judgment: Impaired [employer] Scales of Cognitive Functioning: Confused/appropriate Sensation Sensation Light Touch: Appears Intact Hot/Cold: Appears Intact Proprioception: Appears Intact Stereognosis: Not tested Additional Comments: Sensation appears to be intact in BUEs with gross testing Coordination Gross Motor Movements are Fluid and Coordinated: No Fine Motor Movements are Fluid and Coordinated: No Coordination and Movement Description: Mild coordination deficits noted in the LUE with selfcare tasks and finger to nose testing.  Increased difficulty with opening the soap bottle Motor  Motor Motor: Hemiplegia;Abnormal postural alignment and control Motor - Skilled Clinical Observations: mild left hemiparesis  Trunk/Postural Assessment  Cervical Assessment Cervical Assessment: Exceptions to [facility] (forward head) Thoracic Assessment Thoracic Assessment: Exceptions to [facility] (rounded shoulders with thoracic kyphosis) Lumbar Assessment Lumbar Assessment: Exceptions to [facility] (posterior pelvic tilt) Postural Control Postural Control: Deficits on evaluation Righting Reactions: posterior LOB in standing  Balance Balance Balance Assessed: Yes Static Sitting Balance Static Sitting - Balance Support: Feet supported Static Sitting - Level of Assistance: 5: Stand by assistance Dynamic Sitting Balance Dynamic Sitting - Balance Support: Feet unsupported;During functional activity Dynamic Sitting - Level of Assistance: 4: Min assist Static Standing Balance Static Standing - Balance Support: During functional activity Static Standing - Level of  Assistance: 4: Min assist Dynamic Standing Balance Dynamic Standing - Balance Support: During functional activity Dynamic Standing - Level of Assistance: 3: Mod assist Extremity/Trunk Assessment RUE Assessment RUE Assessment: Within Functional Limits Passive Range of Motion (PROM) Comments: PROM WFLs for all joints Active Range of Motion (AROM) Comments: AROM WFLS for all joints General Strength Comments: 4/5 throughout LUE Assessment LUE Assessment: Exceptions to [facility] Passive Range of Motion (PROM) Comments: PROM WFLs Active Range of Motion (AROM) Comments: WFLS for gross assessment General Strength Comments: 3+/5 throughout with slightly impaired finger to nose testing compared to RUE.  Decreased FM coordination as well but uses the LUE functionally at a diminshed level.  Care Tool Care Tool Self Care Eating   Eating Assist Level: Set up assist    Oral Care    Oral Care Assist Level: Supervision/Verbal cueing  Bathing   Body parts bathed by patient: Right arm;Left arm;Chest;Abdomen;Front perineal area;Buttocks;Right upper leg;Left upper leg;Left lower leg;Face Body parts bathed by helper: Right lower leg   Assist Level: Minimal Assistance - Patient > 75%    Upper Body Dressing(including orthotics)   What is the patient wearing?: Pull over shirt   Assist Level: Moderate Assistance - Patient 50 - 74%    Lower Body Dressing (excluding footwear)   What is the patient wearing?: Underwear/pull up;Pants Assist for lower body dressing: Moderate Assistance - Patient 50 - 74%    Putting on/Taking off footwear   What is the patient wearing?: Non-skid slipper socks Assist for footwear: Maximal Assistance - Patient 25 - 49%       Care Tool Toileting Toileting activity   Assist for toileting: Moderate Assistance - Patient 50 - 74%     Care Tool Bed Mobility Roll left and right activity   Roll left and right assist level: Minimal Assistance - Patient > 75%    Sit to lying  activity   Sit to lying assist level: Minimal Assistance - Patient > 75%    Lying to sitting edge of bed activity   Lying to sitting edge of bed assist level: Moderate Assistance - Patient 50 - 74%     Care Tool Transfers Sit to stand transfer   Sit to stand assist level: Minimal Assistance - Patient > 75%    Chair/bed transfer   Chair/bed transfer assist level: Moderate Assistance - Patient 50 - 74%     Toilet transfer   Assist Level: Moderate Assistance - Patient 50 - 74%     Care Tool Cognition Expression of Ideas and Wants Expression of Ideas and Wants: Some difficulty - exhibits some difficulty with expressing needs and ideas (e.g, some words or finishing thoughts) or speech is not clear   Understanding Verbal and Non-Verbal Content Understanding Verbal and Non-Verbal Content: Usually understands - understands most conversations, but misses some part/intent of message. Requires cues at times to understand   Memory/Recall Ability *first 3 days only Memory/Recall Ability *first 3 days only: That he or she is in a hospital/hospital unit    Refer to Care Plan for [place] 1 OT Short Term Goal 1 (Week 1): Pt will complete UB dressing at supervision for two consecutive sessions. OT Short Term Goal 2 (Week 1): Pt will complete LB bathing sit to stand with min guard assist for two sessions. OT Short Term Goal 3 (Week 1): Pt will complete LB dressing sit to stand with min assist. OT Short Term Goal 4 (Week 1): Pt will perform toilet transfers to the elevated toilet with use of the RW or LRAD and min guard assist.  Recommendations for other services: Neuropsych   Skilled Therapeutic Intervention ADL ADL Eating: Set up Where Assessed-Eating: Edge of bed Grooming: Setup Where Assessed-Grooming: Edge of bed Upper Body Bathing: Supervision/safety Where Assessed-Upper Body Bathing: Edge of bed Lower Body Bathing: Minimal assistance Where Assessed-Lower  Body Bathing: Edge of bed Upper Body Dressing: Moderate assistance Where Assessed-Upper Body Dressing: Edge of bed Lower Body Dressing: Maximal assistance Where Assessed-Lower Body Dressing: Edge of bed Toileting: Moderate assistance Where Assessed-Toileting: Glass blower/designer: Moderate assistance Toilet Transfer Method: Counselling psychologist: Raised toilet seat Tub/Shower Transfer: Not assessed Social research officer, government: Not assessed Mobility  Bed Mobility Bed Mobility: Supine to Sit;Sit to Supine Supine to Sit: Moderate Assistance - Patient 50-74% Sit to Supine: Minimal  Assistance - Patient > 75% Transfers Sit to Stand: Minimal Assistance - Patient > 75% Stand to Sit: Minimal Assistance - Patient > 75%  Session Note:  Pt in bed to start with spouse present.  He needed mod assist for transfer to the bed with decreased orientation of place and time noted as he stated "[facility].  He also reported the day of the week as Tuesday and the month as July.  He was able to complete bathing sit to stand EOB with decreased overall thoroughness and mod questioning cueing.  Min assist for sit to stand during LB selfcare with supervision for washing his UB.  He attempted to donn his pullover shirt backwards with one arm through the neck opening without any awareness, until therapist provided him direct cueing.  He then needed mod assist for pulling it down in the front and back.  He was unable to donn his right sock secondary to history of hip replacement and inability to cross it over the opposite knee.  Mod assist for functional mobility to the bathroom without an assistive device and back to the bed.  Pt with increased fatigue and requesting to lay down.  Min assist for sit to supine with discussion with pt and spouse on expected LOS and supervision goals.  She voices understanding but is hoping for longer stay if possible.  Call button and phone in reach with [place]  belt, safety bed alarm, and tele-sitter in place.    Discharge Criteria: Patient will be discharged from OT if patient refuses treatment 3 consecutive times without medical reason, if treatment goals not met, if there is a change in medical status, if patient makes no progress towards goals or if patient is discharged from hospital.  The above assessment, treatment plan, treatment alternatives and goals were discussed and mutually agreed upon: by patient and by family  Casara Perrier OTR/L 08/01/2020, 3:56 PM

## 2020-08-01 NOTE — Evaluation (Signed)
Speech Language Pathology Assessment and Plan  Patient Details  Name: [name] [name] MRN: [id number] Date of Birth: 1940-04-26  SLP Diagnosis: Cognitive Impairments  Rehab Potential: Good ELOS: 2-3 weeks   Today's Date: 08/01/2020 SLP Individual Time: 2409-7353 SLP Individual Time Calculation (min): 60 min  Hospital Problem: Principal Problem:   Subdural hemorrhage following injury [facility])  Past Medical History:  Past Medical History:  Diagnosis Date  . Acute rheumatic heart disease, unspecified    childhood, age  55 & 22  . Acute rheumatic pericarditis   . Atrial fibrillation ([place])    history  . CHF (congestive heart failure) ([place])   . Diverticulosis   . Dysrhythmia   . 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 External hemorrhoids without mention of complication   . H/O aortic valve replacement   . H/O mitral valve replacement   . 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 Lesion of ulnar nerve   . 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 Other and unspecified hyperlipidemia   . Pre-diabetes   . Previous back surgery 1978, jan 2007  . Psychosexual dysfunction with inhibited sexual excitement   . SOB (shortness of breath)    "with heavy exercise"  . Stroke ([place]) 08/2013   "I WAS IN AFIB AND THREW A CLOT, THE EFFECTS WERE TRANSITORY AND DIDNT LAST BUT FOR 30 MINUTES"   . Thoracic aortic aneurysm [facility])    Past Surgical History:  Past Surgical History:  Procedure Laterality Date  . AORTIC AND MITRAL VALVE REPLACEMENT     09/2004  . CARDIOVERSION     3 times from 2004-2006  . CARDIOVERSION N/A 09/26/2013   Procedure: CARDIOVERSION;  Surgeon: Larey Dresser, MD;  Location: [place];  Service: Cardiovascular;  Laterality: N/A;  . CARDIOVERSION N/A 06/19/2014   Procedure: CARDIOVERSION;  Surgeon: Jerline Pain, MD;  Location: [place];  Service: Cardiovascular;  Laterality: N/A;  . CARDIOVERSION N/A 04/24/2017   Procedure: CARDIOVERSION;  Surgeon: Larey Dresser, MD;  Location: [facility] ENDOSCOPY;  Service: Cardiovascular;  Laterality: N/A;  . CARDIOVERSION N/A 06/08/2017   Procedure: CARDIOVERSION;  Surgeon: Pixie Casino, MD;  Location: [facility] ENDOSCOPY;  Service: Cardiovascular;  Laterality: N/A;  . CARDIOVERSION N/A 08/24/2017   Procedure: CARDIOVERSION;  Surgeon: Skeet Latch, MD;  Location: [facility] ENDOSCOPY;  Service: Cardiovascular;  Laterality: N/A;  . CARDIOVERSION N/A 02/14/2018   Procedure: CARDIOVERSION;  Surgeon: Larey Dresser, MD;  Location: [facility] ENDOSCOPY;  Service: Cardiovascular;  Laterality: N/A;  . COLONOSCOPY    . CRANIOTOMY N/A 07/28/2020   Procedure: CRANIOTOMY FOR EVACUATION OF SUBDURAL HEMATOMA;  Surgeon: Eustace Wolfinger, MD;  Location: [place];  Service: Neurosurgery;  Laterality: N/A;  . laminectomies     10/1975 and in 03/2005  . TONSILLECTOMY     1950  . TOTAL HIP ARTHROPLASTY Right 04/03/2018   Procedure: TOTAL HIP ARTHROPLASTY ANTERIOR APPROACH;  Surgeon: Paralee Cancel, MD;  Location: [facility];  Service: Orthopedics;  Laterality: Right;  70 mins    Assessment / Plan / Recommendation Clinical Impression  Patient is a 80 y.o. year old male with history of dCHF, AVR/MVR, aortic aneurysm, A flutter/ Afib s/p multiple CV with recurrence on Tikosyn/Coumadin, CVT who was admitted on 07/27/20 after waking up with difficulty speaking, decreased movement on right sided. Of note, patient had sustained a fall at church the day before where he had LOC, struck the back of his head, was  evaluated in ED with negative CT head. 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 He was intubated for airway protection, Coumadin reversed and NS consulted for input. Patient had decrebrate posturing with unequal and unreactive pupils and minimal gag reflex. Family elected DNR with comfort care due to poor  prognosis but on 05/24, patient was showing improvement with ability to follow some commands and repeat CT head showing persistent bilateral SDH with slightly smaller size and lesser degree of shift. [name]   He was taken to OR on 05/24 for right craniotomy for evacuation of R-SDH by Dr. Ronnald Ramp. Pharmacy was consulted to manage Tikosyn dose/electrolyte abnormalities--daily EKG X 5 days recommended. He tolerated extubation without difficulty the next day and therapy evaluations initiated. Patient noted to be limited by strong posterior bias, visual deficits with difficulty scanning environment, mild sensory deficits, left>right sided weakness as well as poor safety awareness. CIR recommended due to functional deficits.   Patient transferred to CIR on 07/31/2020 .  Pt presents with mild cognitive impairment as evidenced by SLUMS score of 14/30 [facility] = 27+). Pt with most notable deficits in orientation, immediate/delayed recall, working memory, Astronomer and executive function. Pt generally oriented to situation, however perseverative thinking it was mid-July and unable to recall/carryover orientation throughout tx session (SLP providing calendars to assist). Pt demonstrates impaired sustained attention impacting encoding phase of memory during session. Wife reports she is responsible for money/medication management at home (they are currently moving to [employer]) but pt is generally independent with daily living tasks at home. Pt and wife report no change in speech, language or swallow, pt with decreased insight into current cognitive deficits. Wife reports cognition is greatest impairment at this time and pt is a high fall risk due to decreased awareness and mild agitation at times. Pt will benefit from skilled ST to increase safety and independence with daily living tasks for d/c environment.   Skilled Therapeutic Interventions          Pt participating in [place] Mental Status  Examination (SLUMS) as well as further non-standardized assessments of cognitive function.   SLP Assessment  Patient will need skilled [facility] during CIR admission    Recommendations  Oral Care Recommendations: Oral care BID Recommendations for Other Services: Neuropsych consult Patient destination: Home Follow up Recommendations: Other (comment) (tbd based on rehab progress) Equipment Recommended: None recommended by SLP    SLP Frequency 3 to 5 out of 7 days   SLP Duration  SLP Intensity  SLP Treatment/Interventions 2-3 weeks  Minumum of 1-2 x/day, 30 to 90 minutes  Cognitive remediation/compensation;Therapeutic Activities;Therapeutic Exercise;Internal/external aids;Cueing hierarchy;Functional tasks;Patient/family education;Medication managment    Pain Pain Assessment Pain Scale: 0-10 Pain Score: 0-No pain  Prior Functioning Cognitive/Linguistic Baseline: Within functional limits Type of Home: House  Lives With: Spouse Available Help at Discharge: Family;Available 24 hours/day Education: Law school Vocation: Retired  Programmer, systems Overall Cognitive Status: Impaired/Different from baseline Arousal/Alertness: Awake/alert Orientation Level: Oriented to person;Oriented to place;Oriented to situation;Disoriented to time Attention: Sustained;Focused Focused Attention: Appears intact Sustained Attention: Impaired Sustained Attention Impairment: Verbal basic Memory: Impaired Memory Impairment: Decreased recall of new information;Decreased short term memory Decreased Short Term Memory: Verbal basic Executive Function: Sequencing;Organizing Sequencing: Impaired Sequencing Impairment: Verbal basic Organizing: Impaired Organizing Impairment: Verbal basic Safety/Judgment: Impaired [employer]  of Cognitive Functioning: Confused/appropriate  Comprehension Auditory Comprehension Overall Auditory Comprehension: Appears within  functional limits for tasks assessed Yes/No Questions: Within Functional Limits Commands: Within Functional Limits Conversation: Simple Interfering Components: Attention Expression Expression Primary Mode of Expression: Verbal Verbal Expression Overall Verbal Expression: Appears within functional limits for tasks assessed Oral Motor Oral Motor/Sensory Function Overall Oral Motor/Sensory Function: Within functional limits Motor Speech Overall Motor Speech: Appears within functional limits for tasks assessed  Care Tool Care Tool Cognition Expression of Ideas and Wants Expression of Ideas and Wants: Some difficulty - exhibits some difficulty with expressing needs and ideas (e.g, some words or finishing thoughts) or speech is not clear   Understanding Verbal and Non-Verbal Content Understanding Verbal and Non-Verbal Content: Usually understands - understands most conversations, but misses some part/intent of message. Requires cues at times to understand   Memory/Recall Ability *first 3 days only Memory/Recall Ability *first 3 days only: That he or she is in a hospital/hospital unit    Short Term Goals: Week 1: SLP Short Term Goal 1 (Week 1): Pt will increased orientation to time, situation and recent medical events with 100% accuracy min A cues SLP Short Term Goal 2 (Week 1): Pt will complete mildly complex problem solving tasks with 75% accuracy mod A cues SLP Short Term Goal 3 (Week 1): Pt will increase recall of functional and novel information with 70% accuracy provided mod A cues SLP Short Term Goal 4 (Week 1): Pt will increase sustained attention for functional tasks to 10 minutes provided min A cues  Refer to Care Plan for Long Term Goals  Recommendations for other services: None   Discharge Criteria: Patient will be discharged from SLP if patient refuses treatment 3 consecutive times without medical reason, if treatment goals not met, if there is a change in medical status, if  patient makes no progress towards goals or if patient is discharged from hospital.  The above assessment, treatment plan, treatment alternatives and goals were discussed and mutually agreed upon: by patient and by family  Dewaine Conger 08/01/2020, 11:29 AM

## 2020-08-01 NOTE — Progress Notes (Signed)
Inpatient Rehabilitation  Patient information reviewed and entered into eRehab system by Ted Leonhart M. Gresham Caetano, M.A., CCC/SLP, PPS Coordinator.  Information including medical coding, functional ability and quality indicators will be reviewed and updated through discharge.    

## 2020-08-01 NOTE — Evaluation (Signed)
Physical Therapy Assessment and Plan  Patient Details  Name: [name] [name] MRN: [id number] Date of Birth: May 02, 1940  PT Diagnosis: Abnormal posture, Abnormality of gait, Cognitive deficits, Difficulty walking, Impaired sensation, Muscle weakness and Pain in neck Rehab Potential: Good ELOS: 2-2.5 weeks   Today's Date: 08/01/2020 PT Individual Time: 3570-1779 PT Individual Time Calculation (min): 62 min    Hospital Problem: Principal Problem:   Subdural hemorrhage following injury [facility])   Past Medical History:  Past Medical History:  Diagnosis Date  . Acute rheumatic heart disease, unspecified    childhood, age  26 & 70  . Acute rheumatic pericarditis   . Atrial fibrillation ([place])    history  . CHF (congestive heart failure) ([place])   . Diverticulosis   . Dysrhythmia   . 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 External hemorrhoids without mention of complication   . H/O aortic valve replacement   . H/O mitral valve replacement   . 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 Lesion of ulnar nerve   . 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 Other and unspecified hyperlipidemia   . Pre-diabetes   . Previous back surgery 1978, jan 2007  . Psychosexual dysfunction with inhibited sexual excitement   . SOB (shortness of breath)    "with heavy exercise"  . Stroke ([place]) 08/2013   "I WAS IN AFIB AND THREW A CLOT, THE EFFECTS WERE TRANSITORY AND DIDNT LAST BUT FOR 30 MINUTES"   . Thoracic aortic aneurysm [facility])    Past Surgical History:  Past Surgical History:  Procedure Laterality Date  . AORTIC AND MITRAL VALVE REPLACEMENT     09/2004  . CARDIOVERSION     3 times from 2004-2006  . CARDIOVERSION N/A 09/26/2013   Procedure: CARDIOVERSION;  Surgeon: Larey Dresser, MD;  Location: [place];  Service: Cardiovascular;  Laterality: N/A;  . CARDIOVERSION N/A 06/19/2014   Procedure: CARDIOVERSION;  Surgeon: Jerline Pain, MD;  Location: [place];  Service: Cardiovascular;   Laterality: N/A;  . CARDIOVERSION N/A 04/24/2017   Procedure: CARDIOVERSION;  Surgeon: Larey Dresser, MD;  Location: [facility] ENDOSCOPY;  Service: Cardiovascular;  Laterality: N/A;  . CARDIOVERSION N/A 06/08/2017   Procedure: CARDIOVERSION;  Surgeon: Pixie Casino, MD;  Location: [facility] ENDOSCOPY;  Service: Cardiovascular;  Laterality: N/A;  . CARDIOVERSION N/A 08/24/2017   Procedure: CARDIOVERSION;  Surgeon: Skeet Latch, MD;  Location: [facility] ENDOSCOPY;  Service: Cardiovascular;  Laterality: N/A;  . CARDIOVERSION N/A 02/14/2018   Procedure: CARDIOVERSION;  Surgeon: Larey Dresser, MD;  Location: [facility] ENDOSCOPY;  Service: Cardiovascular;  Laterality: N/A;  . COLONOSCOPY    . CRANIOTOMY N/A 07/28/2020   Procedure: CRANIOTOMY FOR EVACUATION OF SUBDURAL HEMATOMA;  Surgeon: Eustace Oscarson, MD;  Location: [place];  Service: Neurosurgery;  Laterality: N/A;  . laminectomies     10/1975 and in 03/2005  . TONSILLECTOMY     1950  . TOTAL HIP ARTHROPLASTY Right 04/03/2018   Procedure: TOTAL HIP ARTHROPLASTY ANTERIOR APPROACH;  Surgeon: Paralee Cancel, MD;  Location: [facility];  Service: Orthopedics;  Laterality: Right;  70 mins    Assessment & Plan Clinical Impression: Patient is a 80 y.o. year old male with history of dCHF, AVR/MVR, aortic aneurysm, A flutter/ Afib  s/p multiple CV with recurrence on Tikosyn/Coumadin, CVT who was admitted on 07/27/20 after waking up with difficulty speaking, decreased movement on right sided. Of note, patient had sustained a fall at church  the day before where he had LOC, struck the back of his head, was evaluated in ED with negative CT head.  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  He was intubated for airway protection, Coumadin reversed and  NS consulted for input. Patient had decrebrate posturing with unequal and  unreactive pupils and minimal gag reflex. Family elected DNR with comfort care due to poor prognosis but on 05/24, patient was showing improvement with ability to follow some commands and repeat CT head showing persistent bilateral SDH with slightly smaller size and lesser degree of shift.  [name]   He was taken to OR on 05/24 for right craniotomy for evacuation of R-SDH by Dr. Ronnald Ramp. Pharmacy was consulted to manage Tikosyn dose/electrolyte abnormalities--daily EKG X 5 days recommended.   He tolerated extubation without difficulty the next day and therapy evaluations initiated.  Patient noted to be limited by strong posterior bias, visual deficits with difficulty scanning environment, mild sensory deficits, left>right sided weakness as well as poor safety awareness. CIR recommended due to functional deficits.   Patient transferred to CIR on 07/31/2020 .   Patient currently requires min assist with mobility secondary to muscle weakness and muscle joint tightness, decreased cardiorespiratoy endurance, unbalanced muscle activation and decreased coordination, decreased midline orientation, decreased awareness, decreased problem solving, decreased memory and delayed processing and decreased sitting balance, decreased standing balance, decreased postural control and decreased balance strategies.  Prior to hospitalization, patient was modified independent  with mobility and lived with Spouse in a House home.  Home access is  Level entry.  Patient will benefit from skilled PT intervention to maximize safe functional mobility, minimize fall risk and decrease caregiver burden for planned discharge home with 24 hour supervision.  Anticipate patient will benefit from follow up OP at discharge.  PT - End of Session Activity Tolerance: Tolerates 30+ min activity with multiple rests Endurance Deficit: Yes Endurance Deficit Description: requires seated rest breaks and is falling asleep at end of session PT Assessment Rehab  Potential (ACUTE/IP ONLY): Good PT Patient demonstrates impairments in the following area(s): Balance;Perception;Behavior;Safety;Edema;Sensory;Endurance;Skin Integrity;Motor;Nutrition;Pain PT Transfers Functional Problem(s): Bed Mobility;Bed to Chair;Car;Furniture;Floor PT Locomotion Functional Problem(s): Ambulation;Stairs PT Plan PT Intensity: Minimum of 1-2 x/day ,45 to 90 minutes PT Frequency: 5 out of 7 days PT Duration Estimated Length of Stay: 2-2.5 weeks PT Treatment/Interventions: Ambulation/gait training;Community reintegration;DME/adaptive equipment instruction;Neuromuscular re-education;Psychosocial support;Stair training;UE/LE Strength taining/ROM;Balance/vestibular training;Functional electrical stimulation;Discharge planning;Pain management;Skin care/wound management;Therapeutic Activities;UE/LE Coordination activities;Cognitive remediation/compensation;Disease management/prevention;Functional mobility training;Patient/family education;Splinting/orthotics;Therapeutic Exercise;Visual/perceptual remediation/compensation PT Transfers Anticipated Outcome(s): supervision using LRAD PT Locomotion Anticipated Outcome(s): supervision using LRAD PT Recommendation Follow Up Recommendations: Outpatient PT;24 hour supervision/assistance Patient destination: Home Equipment Recommended: To be determined   PT Evaluation Precautions/Restrictions Precautions Precautions: Fall;Other (comment) Precaution Comments: posey belt, mild LUE weakness, confusion, R lean in standing, SBP <160 Restrictions Weight Bearing Restrictions: No Pain Pain Assessment Pain Scale: 0-10 Pain Score: 0-No pain Home Living/Prior Functioning Home Living Available Help at Discharge: Family;Available 24 hours/day Type of Home: Other(Comment) (ILF apartment) Home Access: Level entry Home Layout: One level Bathroom Shower/Tub: Multimedia programmer: Standard Additional Comments: Pt and wife just sold  their home and are in process of moving to [place] ILF on 6/6, but are currently transient due to quicker than expected sale of their home  Lives With: Spouse (wife, Agnus) Prior Function Level of  Independence: Requires assistive device for independence;Independent with gait;Independent with transfers (used walking cane in community on unlevel surfaces, otherwise independent for mobility)  Able to Take Stairs?: Yes Driving: Yes Vocation: Retired Comments: Pt utilized a walking stick for ambulation on unlevel surfaces.  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: Impaired Inattention/Neglect: Does not attend to left side of body Spatial Orientation: impaired midline orientation in standing Praxis Praxis: Intact  Cognition  Overall Cognitive Status: Impaired/Different from baseline Arousal/Alertness: Awake/alert Orientation Level: Oriented to person;Oriented to place;Oriented to situation;Disoriented to time (states it is "june 2022") Attention: Focused Focused Attention: Appears intact Sustained Attention: Impaired Memory: Impaired Awareness: Impaired Problem Solving: Impaired Safety/Judgment: Impaired [employer] Scales of Cognitive Functioning: Confused/appropriate Sensation  Sensation Light Touch: Appears Intact (initially states R/L exactly opposite with impaired laterality but when oreinted to correct side states them accurately) Hot/Cold: Not tested Proprioception: Impaired by gross assessment (noted in B LEs during gait) Stereognosis: Not tested Coordination Gross Motor Movements are Fluid and Coordinated: No Coordination and Movement Description: noted impaired B LE coordination during gait training Motor  Motor Motor: Hemiplegia;Abnormal postural alignment and control Motor - Skilled Clinical Observations: mild left hemiparesis (UE>LE)   Trunk/Postural Assessment  Cervical Assessment Cervical Assessment: Exceptions to [facility] (pt  holds cervical spine very stiff with reports of neck pain since his fall) Thoracic Assessment Thoracic Assessment: Exceptions to [facility] (rounded shoulders with mild kyphosis) Lumbar Assessment Lumbar Assessment: Exceptions to [facility] (posterior pelvic tilt in sitting) Postural Control Postural Control: Deficits on evaluation Righting Reactions: R lateral lean during gait  Balance Balance Balance Assessed: Yes Static Sitting Balance Static Sitting - Balance Support: Feet supported Static Sitting - Level of Assistance: 5: Stand by assistance Dynamic Sitting Balance Dynamic Sitting - Balance Support: Feet supported Dynamic Sitting - Level of Assistance: 4: Min assist Static Standing Balance Static Standing - Balance Support: During functional activity Static Standing - Level of Assistance: 4: Min assist Dynamic Standing Balance Dynamic Standing - Balance Support: During functional activity Dynamic Standing - Level of Assistance: 3: Mod assist Extremity Assessment      RLE Assessment RLE Assessment: Exceptions to [facility] Active Range of Motion (AROM) Comments: WFL General Strength Comments: grossly 4+/5 to 5/5 assessed in supine; however, demonstrates some functional strength deficits during mobility tasks LLE Assessment LLE Assessment: Exceptions to [facility] Active Range of Motion (AROM) Comments: WFL General Strength Comments: grossly 4+/5 to 5/5 assessed in supine; however, demonstrates some functional strength deficits during mobility tasks  Care Tool Care Tool Bed Mobility Roll left and right activity   Roll left and right assist level: Minimal Assistance - Patient > 75%    Sit to lying activity   Sit to lying assist level: Minimal Assistance - Patient > 75%    Lying to sitting edge of bed activity   Lying to sitting edge of bed assist level: Moderate Assistance - Patient 50 - 74%     Care Tool Transfers Sit to stand transfer   Sit to stand assist level: Minimal Assistance - Patient  > 75%    Chair/bed transfer   Chair/bed transfer assist level: Moderate Assistance - Patient 50 - 74%     Psychologist, counselling transfer activity did not occur: Safety/medical concerns (pt fatigue)        Care Tool Locomotion Ambulation   Assist level: Moderate Assistance - Patient 50 - 74% Assistive device: No Device Max distance:  236f  Walk 10 feet activity   Assist level: Moderate Assistance - Patient - 50 - 74% Assistive device: No Device   Walk 50 feet with 2 turns activity   Assist level: Moderate Assistance - Patient - 50 - 74% Assistive device: No Device  Walk 150 feet activity   Assist level: Moderate Assistance - Patient - 50 - 74% Assistive device: No Device  Walk 10 feet on uneven surfaces activity Walk 10 feet on uneven surfaces activity did not occur: Safety/medical concerns (pt fatigue)      Stairs   Assist level: Minimal Assistance - Patient > 75% Stairs assistive device: 2 hand rails Max number of stairs: 8  Walk up/down 1 step activity   Walk up/down 1 step (curb) assist level: Minimal Assistance - Patient > 75% Walk up/down 1 step or curb assistive device: 2 hand rails    Walk up/down 4 steps activity Walk up/down 4 steps assist level: Minimal Assistance - Patient > 75% Walk up/down 4 steps assistive device: 2 hand rails  Walk up/down 12 steps activity Walk up/down 12 steps activity did not occur: Safety/medical concerns (pt fatigue)      Pick up small objects from floor Pick up small object from the floor (from standing position) activity did not occur: Safety/medical concerns (fall risk)      Wheelchair Will patient use wheelchair at discharge?: No (TBD but do not anticipate)          Wheel 50 feet with 2 turns activity      Wheel 150 feet activity        Refer to Care Plan for Long Term Goals  SHORT TERM GOAL WEEK 1 PT Short Term Goal 1 (Week 1): Pt will perform supine<>sit with CGA PT Short Term Goal 2 (Week 1): Pt  will perform sit<>stands using LRAD with CGA PT Short Term Goal 3 (Week 1): Pt will perform bed<>chair transfers using LRAD with CGA PT Short Term Goal 4 (Week 1): Pt will ambulate at least 1580fusing LRAD with CGA PT Short Term Goal 5 (Week 1): Pt will participate in standardized outcome measure to assess fall risk  Recommendations for other services: None   Skilled Therapeutic Intervention Pt received supine in bed with his wife, Agnus, present and his son, JoJeral Fruitshortly arriving. Pt agreeable to therapy session. Evaluation completed (see details above) with patient education regarding purpose of PT evaluation, PT POC and goals, therapy schedule, weekly team meetings, and other CIR information including safety plan and fall risk safety. Pt's son informed therapist that pt has been experiencing cervical pain since pt's fall - per chart review CT scan performed on 5/22 with no acute findings. Pt performed the below mobility tasks with the specified levels of assistance. Therapist cued pt through logroll technique for bed mobility due to recent cervical pain; however, no complaints of pain during session. During gait training pt noted to have mild R lean, narrow BOS, decreased B LE foot clearance, and impaired balance. During stair navigation training pt selected reciprocal pattern on ascent and step-to pattern on descent with pt demoing slightly impaired eccentric control on descent. At end of session pt left supine in bed quickly falling asleep with pts family present - posey belt alarm donned and telesitter in place.   Mobility Bed Mobility Bed Mobility: Supine to Sit;Sit to Supine Supine to Sit: Minimal Assistance - Patient > 75%;Moderate Assistance - Patient 50-74% Sit to Supine: Minimal Assistance - Patient > 75% Transfers Transfers: Sit  to Stand;Stand to Sit;Stand Pivot Transfers Sit to Stand: Minimal Assistance - Patient > 75% Stand to Sit: Minimal Assistance - Patient > 75% Stand Pivot  Transfers: Minimal Assistance - Patient > 75%;Moderate Assistance - Patient 50 - 74% Stand Pivot Transfer Details: Verbal cues for precautions/safety;Verbal cues for gait pattern;Verbal cues for technique;Tactile cues for weight shifting;Tactile cues for sequencing;Tactile cues for posture Transfer (Assistive device): None Locomotion  Gait Ambulation: Yes Gait Assistance: Minimal Assistance - Patient > 75%;Moderate Assistance - Patient 50-74% Gait Distance (Feet): 214 Feet Assistive device: None Gait Assistance Details: Verbal cues for sequencing;Verbal cues for gait pattern;Manual facilitation for weight shifting;Verbal cues for technique;Tactile cues for posture;Tactile cues for weight shifting Gait Gait: Yes Gait Pattern: Impaired Gait Pattern: Step-through pattern;Poor foot clearance - left;Poor foot clearance - right;Narrow base of support;Lateral trunk lean to right;Decreased dorsiflexion - right;Decreased dorsiflexion - left;Decreased stride length (right lateral trunk lean) Gait velocity: reduced Stairs / Additional Locomotion Stairs: Yes Stairs Assistance: Minimal Assistance - Patient > 75% Stair Management Technique: Two rails Number of Stairs: 8 Height of Stairs: 6 Wheelchair Mobility Wheelchair Mobility: No   Discharge Criteria: Patient will be discharged from PT if patient refuses treatment 3 consecutive times without medical reason, if treatment goals not met, if there is a change in medical status, if patient makes no progress towards goals or if patient is discharged from hospital.  The above assessment, treatment plan, treatment alternatives and goals were discussed and mutually agreed upon: by patient and by family  Tawana Scale , PT, DPT, CSRS 08/01/2020, 12:14 PM

## 2020-08-02 LAB — GLUCOSE, CAPILLARY
Glucose-Capillary: 136 mg/dL — ABNORMAL HIGH (ref 70–99)
Glucose-Capillary: 179 mg/dL — ABNORMAL HIGH (ref 70–99)
Glucose-Capillary: 140 mg/dL — ABNORMAL HIGH (ref 70–99)
Glucose-Capillary: 199 mg/dL — ABNORMAL HIGH (ref 70–99)

## 2020-08-02 LAB — MAGNESIUM: Magnesium: 2.1 mg/dL (ref 1.7–2.4)

## 2020-08-02 LAB — POTASSIUM: Potassium: 3.9 mmol/L (ref 3.5–5.1)

## 2020-08-02 MED ORDER — DEXAMETHASONE 4 MG PO TABS
4.0000 mg | ORAL_TABLET | Freq: Two times a day (BID) | ORAL | Status: DC
  Administered 2020-08-02 – 2020-08-05 (×6): 4 mg via ORAL
  Filled 2020-08-02 (×6): qty 1

## 2020-08-02 NOTE — Progress Notes (Addendum)
PROGRESS NOTE   Subjective/Complaints: Slept well again. Just waking up when we met this morning. Mild headache  ROS: Limited due to cognitive/behavioral     Objective:   No results found. Recent Labs    08/01/20 0610  WBC 13.4*  HGB 10.4*  HCT 31.1*  PLT 214   Recent Labs    07/31/20 0445 08/01/20 0610  NA 139 138  K 4.1 4.1  CL 112* 109  CO2 22 21*  GLUCOSE 141* 145*  BUN 29* 26*  CREATININE 0.74 0.74  CALCIUM 8.4* 8.6*    Intake/Output Summary (Last 24 hours) at 08/02/2020 0828 Last data filed at 08/02/2020 3300 Gross per 24 hour  Intake 755 ml  Output 3625 ml  Net -2870 ml        Physical Exam: Vital Signs Blood pressure (!) 175/85, pulse (!) 53, temperature 97.8 F (36.6 C), temperature source Oral, resp. rate 18, SpO2 97 %.  Constitutional: No distress . Vital signs reviewed. HEENT: EOMI, oral membranes moist Neck: supple Cardiovascular: RRR without murmur. No JVD    Respiratory/Chest: CTA Bilaterally without wheezes or rales. Normal effort    GI/Abdomen: BS +, non-tender, non-distended Ext: no clubbing, cyanosis, or edema Psych: sl flat, cooperative Skin: right crani incision CI with staples, occipital wound with scab, minimal drainage Neuro:arouses. Speech clear. LUE and LLE 3/5. RUE and RLE 4-/5. Decreased FMC LUE, impaired depth perception.   Musculoskeletal: cervical discomfort with ROM     Assessment/Plan: 1. Functional deficits which require 3+ hours per day of interdisciplinary therapy in a comprehensive inpatient rehab setting.  Physiatrist is providing close team supervision and 24 hour management of active medical problems listed below.  Physiatrist and rehab team continue to assess barriers to discharge/monitor patient progress toward functional and medical goals  Care Tool:  Bathing    Body parts bathed by patient: Right arm,Left arm,Chest,Abdomen,Front perineal  area,Buttocks,Right upper leg,Left upper leg,Left lower leg,Face   Body parts bathed by helper: Right lower leg     Bathing assist Assist Level: Minimal Assistance - Patient > 75%     Upper Body Dressing/Undressing Upper body dressing   What is the patient wearing?: Pull over shirt    Upper body assist Assist Level: Moderate Assistance - Patient 50 - 74%    Lower Body Dressing/Undressing Lower body dressing      What is the patient wearing?: Underwear/pull up,Pants     Lower body assist Assist for lower body dressing: Moderate Assistance - Patient 50 - 74%     Toileting Toileting    Toileting assist Assist for toileting: Maximal Assistance - Patient 25 - 49%     Transfers Chair/bed transfer  Transfers assist     Chair/bed transfer assist level: Moderate Assistance - Patient 50 - 74%     Locomotion Ambulation   Ambulation assist      Assist level: Moderate Assistance - Patient 50 - 74% Assistive device: No Device Max distance: 216f   Walk 10 feet activity   Assist     Assist level: Moderate Assistance - Patient - 50 - 74% Assistive device: No Device   Walk 50 feet activity   Assist  Assist level: Moderate Assistance - Patient - 50 - 74% Assistive device: No Device    Walk 150 feet activity   Assist    Assist level: Moderate Assistance - Patient - 50 - 74% Assistive device: No Device    Walk 10 feet on uneven surface  activity   Assist Walk 10 feet on uneven surfaces activity did not occur: Safety/medical concerns (pt fatigue)         Wheelchair     Assist Will patient use wheelchair at discharge?: No (TBD but do not anticipate)             Wheelchair 50 feet with 2 turns activity    Assist            Wheelchair 150 feet activity     Assist          Blood pressure (!) 175/85, pulse (!) 53, temperature 97.8 F (36.6 C), temperature source Oral, resp. rate 18, SpO2 97 %.  Medical Problem List  and Plan: 1.  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 Pt s/p Right-sided craniotomy 5/24             -patient may shower             -ELOS/Goals: 18-21 days, supervision to min assist goals with PT, OT, SLP  -Continue CIR therapies including PT, OT, and SLP   -no instructions for steroid taper--will change decadron to PO and begin to taper off. 2.  Antithrombotics: -DVT/anticoagulation:  Mechanical: Sequential compression devices, below knee Bilateral lower extremities-pt to resume coumadin at some point over the next week or so. Will f/u with NS next week.             -antiplatelet therapy: N/A 3. Pain Management: Hydrocodone prn.  4. Mood: LCSW to follow for evaluation and support.              -antipsychotic agents: NA 5. Neuropsych: This patient is not capable of making decisions on his own behalf. 6. Skin/Wound Care: Routine pressure relief measures.  7. Fluids/Electrolytes/Nutrition: I personally reviewed the patient's labs today.    5/29-continue to encourage fluids         -maintain NSL for now 8. Atypical Atrial flutter: On Toprol, Tikosyn and coumadin PTA--coumadin on hold at present             --monitor HR tid. Need to Keep K+>4.0 and Mg.1.8.--K+ within range--recheck Monday   -mg++ 2.1 5/29             -monitor for syncopal episodes, symptomatic arhythmia              -cards f/u as needed 9. 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              --continue Lasix, Cozaar, metoprolol and prn hydralazine.    -5/29 fair control 10. BPH: Resume Flomax and Proscar.  11. Diastolic CHF: Monitor weights daily and for any signs of overload.              --continue Lasix, hydralazine and Lipitor.  There were no vitals filed for this visit.---needs weights   12. Pre-diabetes: Hgb A1c-6.3 07/2019. Will monitor BS ac/hs  while on steroids and use SSI for elevated BS.   -steroid related             --  Added dietary restrictions.  13. Acute blood loss anemia: hgb 10.4 5/28 14. Leukocytosis: steroid related-no signs of infection      LOS: 2 days A FACE TO FACE EVALUATION WAS PERFORMED  Meredith Staggers 08/02/2020, 8:28 AM

## 2020-08-03 ENCOUNTER — Inpatient Hospital Stay (HOSPITAL_COMMUNITY): Payer: Medicare Other

## 2020-08-03 DIAGNOSIS — S065X0D Traumatic subdural hemorrhage without loss of consciousness, subsequent encounter: Secondary | ICD-10-CM

## 2020-08-03 LAB — BASIC METABOLIC PANEL
Anion gap: 6 (ref 5–15)
BUN: 24 mg/dL — ABNORMAL HIGH (ref 8–23)
CO2: 27 mmol/L (ref 22–32)
Calcium: 8.8 mg/dL — ABNORMAL LOW (ref 8.9–10.3)
Chloride: 101 mmol/L (ref 98–111)
Creatinine, Ser: 0.81 mg/dL (ref 0.61–1.24)
GFR, Estimated: >60 mL/min (ref >60)
Glucose, Bld: 151 mg/dL — ABNORMAL HIGH (ref 70–99)
Potassium: 4 mmol/L (ref 3.5–5.1)
Sodium: 134 mmol/L — ABNORMAL LOW (ref 135–145)

## 2020-08-03 LAB — GLUCOSE, CAPILLARY
Glucose-Capillary: 140 mg/dL — ABNORMAL HIGH (ref 70–99)
Glucose-Capillary: 106 mg/dL — ABNORMAL HIGH (ref 70–99)
Glucose-Capillary: 155 mg/dL — ABNORMAL HIGH (ref 70–99)
Glucose-Capillary: 202 mg/dL — ABNORMAL HIGH (ref 70–99)

## 2020-08-03 LAB — MAGNESIUM: Magnesium: 2 mg/dL (ref 1.7–2.4)

## 2020-08-03 MED ORDER — MAGNESIUM SULFATE 2 GM/50ML IV SOLN
2.0000 g | Freq: Once | INTRAVENOUS | Status: AC
  Administered 2020-08-03: 2 g via INTRAVENOUS
  Filled 2020-08-03: qty 50

## 2020-08-03 NOTE — Progress Notes (Signed)
Patient ID: [name] [name], male   DOB: 05/29/1940, 80 y.o.   MRN: [id number]   SW made efforts to meet with pt to complete assessment, however pt was tired. SW met pt, pt wife, and pt son. SW introduce self, explained role, and discussed discharge process. Wife reports that they are moving into [place] next week on 6/6 and will not be available as often to be in. Pt son reports he is also moving into a new home on 6/7 and will have limited time here. Family aware SW to follow-up with [facility] to discuss possible options based on pt care needs.   SW informed will complete assessment when pt is more awake. SW to follow-up tomorrow after team conference.   SW spoke with Maryan Puls at [facility] in healthcare dept [phone]) to inquire about options for pt based on pt care needs. Reports pt can have home health setup independently, and is eligible for skilled care if needed. If family would like home care services, can contact- [facility] home [facility] on [phone]. SW to follow-up with [facility] later on in week to discuss pt care needs if any.   Loralee Pacas, MSW, [place] Office: [phone] Cell: [phone] Fax: [phone]

## 2020-08-03 NOTE — Progress Notes (Signed)
PROGRESS NOTE   Subjective/Complaints:   Spoke with Pt/family regarding CT head  Pt having difficulty with smart watch   ROS: Limited due to cognitive/behavioral     Objective:   CT HEAD WO CONTRAST  Result Date: 08/03/2020 CLINICAL DATA:  Follow-up subdural hematoma EXAM: CT HEAD WITHOUT CONTRAST TECHNIQUE: Contiguous axial images were obtained from the base of the skull through the vertex without intravenous contrast. COMPARISON:  Five days ago FINDINGS: Brain: A subdural drain has been removed. Decreased subdural hemorrhage on the right with diminishing cortical mass effect. Decreased postoperative gas. Inter hemispheric subdural and subarachnoid blood is also regressed. Mildly decreased left parietal extra-axial hemorrhage with subjacent edema, hematoma measuring 7 mm in thickness. Minimal intraventricular hemorrhage is non progressed. Contusion like appearance in the right temporal lobe without worrisome mass effect. No acute infarct, hydrocephalus, or masslike finding. Vascular: Negative Skull: Unremarkable right craniotomy. 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 IMPRESSION: Decreasing intracranial hemorrhage.  No new abnormality. Electronically Signed   By: Monte Fantasia M.D.   On: 08/03/2020 09:54   Recent Labs    08/01/20 0610  WBC 13.4*  HGB 10.4*  HCT 31.1*  PLT 214   Recent Labs    08/01/20 0610 08/02/20 0548 08/03/20 0428  NA 138  --  134*  K 4.1 3.9 4.0  CL 109  --  101  CO2 21*  --  27  GLUCOSE 145*  --  151*  BUN 26*  --  24*  CREATININE 0.74  --  0.81  CALCIUM 8.6*  --  8.8*    Intake/Output Summary (Last 24 hours) at 08/03/2020 1248 Last data filed at 08/03/2020 1026 Gross per 24 hour  Intake 1178 ml  Output 2550 ml  Net -1372 ml        Physical  Exam: Vital Signs Blood pressure 122/80, pulse 82, temperature 98.5 F (36.9 C), resp. rate 16, SpO2 99 %.   General: No acute distress Mood and affect are appropriate Heart: Regular rate and rhythm no rubs murmurs or extra sounds Lungs: Clear to auscultation, breathing unlabored, no rales or wheezes Abdomen: Positive bowel sounds, soft nontender to palpation, nondistended Extremities: No clubbing, cyanosis, or edema Skin: No evidence of breakdown, no evidence of rash   Neuro:arouses.oriented to person, place , not time  Speech clear. LUE and LLE 4/5. RUE and RLE 4/5. Decreased FMC LUE, impaired depth perception.   Musculoskeletal: cervical discomfort with ROM     Assessment/Plan: 1. Functional deficits which require 3+ hours per day of interdisciplinary therapy in a comprehensive inpatient rehab setting.  Physiatrist is providing close team supervision and 24 hour management of active medical problems listed below.  Physiatrist and rehab team continue to assess barriers to discharge/monitor patient progress toward functional and medical goals  Care Tool:  Bathing    Body parts bathed by patient: Right arm,Left arm,Chest,Abdomen,Front perineal area,Buttocks,Right upper leg,Left upper leg,Left lower leg,Face   Body parts bathed by helper: Right lower leg     Bathing assist Assist Level: Minimal Assistance - Patient > 75%  Upper Body Dressing/Undressing Upper body dressing   What is the patient wearing?: Pull over shirt    Upper body assist Assist Level: Moderate Assistance - Patient 50 - 74%    Lower Body Dressing/Undressing Lower body dressing      What is the patient wearing?: Underwear/pull up,Pants     Lower body assist Assist for lower body dressing: Moderate Assistance - Patient 50 - 74%     Toileting Toileting    Toileting assist Assist for toileting: Maximal Assistance - Patient 25 - 49%     Transfers Chair/bed transfer  Transfers assist      Chair/bed transfer assist level: Moderate Assistance - Patient 50 - 74%     Locomotion Ambulation   Ambulation assist      Assist level: Moderate Assistance - Patient 50 - 74% Assistive device: No Device Max distance: 267ft   Walk 10 feet activity   Assist     Assist level: Moderate Assistance - Patient - 50 - 74% Assistive device: No Device   Walk 50 feet activity   Assist    Assist level: Moderate Assistance - Patient - 50 - 74% Assistive device: No Device    Walk 150 feet activity   Assist    Assist level: Moderate Assistance - Patient - 50 - 74% Assistive device: No Device    Walk 10 feet on uneven surface  activity   Assist Walk 10 feet on uneven surfaces activity did not occur: Safety/medical concerns (pt fatigue)         Wheelchair     Assist Will patient use wheelchair at discharge?: No (TBD but do not anticipate)             Wheelchair 50 feet with 2 turns activity    Assist            Wheelchair 150 feet activity     Assist          Blood pressure 122/80, pulse 82, temperature 98.5 F (36.9 C), resp. rate 16, SpO2 99 %.  Medical Problem List and Plan: 1.  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 Pt s/p Right-sided craniotomy 5/24             -patient may shower             -ELOS/Goals: 18-21 days, supervision to min assist goals with PT, OT, SLP  -Continue CIR therapies including PT, OT, and SLP   -no instructions for steroid taper--will change decadron to PO and begin to taper off. Decreasing R to Left shift on CT head today  2.  Antithrombotics: -DVT/anticoagulation:  Mechanical: Sequential compression devices, below knee Bilateral lower extremities-pt to resume coumadin at some point over the next week or so. Will f/u with [facility] next week.             -antiplatelet therapy: N/A 3. Pain Management: Hydrocodone prn.  4. Mood: LCSW to follow for evaluation and support.               -antipsychotic agents: NA 5. Neuropsych: This patient is not capable of making decisions on his own behalf. 6. Skin/Wound Care: Routine pressure relief measures.  7. Fluids/Electrolytes/Nutrition: I personally reviewed the patient's labs today.    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 Need to Keep K+>4.0 and Mg.1.8.--K+ within range--recheck Monday   -mg++ 2.1 5/29             -monitor for syncopal episodes, symptomatic arhythmia              -cards f/u as needed 9. 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              --continue Lasix, Cozaar, metoprolol and prn hydralazine.     Vitals:   08/03/20 0416 08/03/20 0828  BP: (!) 160/87 122/80  Pulse: (!) 50 82  Resp: 16   Temp: 98.5 F (36.9 C)   SpO2: 99%   Normotensive this am  10. BPH: Resume Flomax and Proscar.  11. Diastolic CHF: Monitor weights daily and for any signs of overload.              --continue Lasix, hydralazine and Lipitor.  There were no vitals filed for this visit.---needs weights   12. Pre-diabetes: Hgb A1c-6.3 07/2019. Will monitor BS ac/hs while on steroids and use SSI for elevated BS.   -steroid related             --Added dietary restrictions.  13. Acute blood loss anemia: hgb 10.4 5/28 14. Leukocytosis: afebrile, steroid related-no signs of infection      LOS: 3 days A FACE TO FACE EVALUATION WAS PERFORMED  Charlett Blake 08/03/2020, 12:48 PM

## 2020-08-03 NOTE — Progress Notes (Signed)
Pharmacy: Dofetilide (Tikosyn) - Follow Up Assessment and Electrolyte Replacement  Pharmacy consulted to assist in monitoring and replacing electrolytes in this 79 y.o. male admitted on 07/31/2020 and to continue Tikosyn.  Labs:    Component Value Date/Time   K 4.0 08/03/2020 0428   MG 2.0 08/03/2020 0428     Plan: Potassium: K >/= 4: No additional supplementation needed  Magnesium: Mg 1.8-2: Give Mg 2 gm IV x1   Thank you for allowing pharmacy to participate in this patient's care   Fara Olden, PharmD PGY-1 Pharmacy Resident 08/03/2020 8:56 AM Please see AMION for all pharmacy numbers

## 2020-08-03 NOTE — Progress Notes (Signed)
Occupational Therapy TBI Note  Patient Details  Name: [name] [name] MRN: [id number] Date of Birth: 1940/08/19  Today's Date: 08/03/2020 OT Individual Time: 2956-2130 OT Individual Time Calculation (min): 55 min    Short Term Goals: Week 1:  OT Short Term Goal 1 (Week 1): Pt will complete UB dressing at supervision for two consecutive sessions. OT Short Term Goal 2 (Week 1): Pt will complete LB bathing sit to stand with min guard assist for two sessions. OT Short Term Goal 3 (Week 1): Pt will complete LB dressing sit to stand with min assist. OT Short Term Goal 4 (Week 1): Pt will perform toilet transfers to the elevated toilet with use of the RW or LRAD and min guard assist.  Skilled Therapeutic Interventions/Progress Updates:    Pt received supine with his wife present, no c/o pain. Pt agreeable to take shower this session with head covered. Pt completed bed mobility with min A to EOB. BP assessed EOB 117/78. Pt completed sit > stand with min A from EOB. He completed functional mobility to the bathroom with the RW with min A overall. He required cueing for sequencing through turns to walk in shower. BP assessed and stable throughout session. Pt completed bathing with min cueing for sequencing and attention to task. Min A for standing balance when washing peri areas. He required frequent cueing to not try and wash his head d/t staples intact. He transferred back to EOB where he donned shirt with poor awareness of error and min A. Pants donned EOB with min A, again poor awareness of putting both feet in one pant leg. Pt stood at the sink to complete oral care with close CGA while standing. Pt was left sitting in w/c with LPN present, safely belt on.  Therapy Documentation Precautions:  Precautions Precautions: Fall,Other (comment) Precaution Comments: posey belt, mild LUE weakness, confusion, R lean in standing, SBP <160 Restrictions Weight Bearing Restrictions: No    Agitated Behavior  Scale: TBI Observation Details Observation Environment: CIR Start of observation period - Date: 08/03/20 Start of observation period - Time: 0730 End of observation period - Date: 08/03/20 End of observation period - Time: 0839 Agitated Behavior Scale (DO NOT LEAVE BLANKS) Short attention span, easy distractibility, inability to concentrate: Present to a slight degree Impulsive, impatient, low tolerance for pain or frustration: Absent Uncooperative, resistant to care, demanding: Absent Violent and/or threatening violence toward people or property: Absent Explosive and/or unpredictable anger: Absent Rocking, rubbing, moaning, or other self-stimulating behavior: Absent Pulling at tubes, restraints, etc.: Absent Wandering from treatment areas: Absent Restlessness, pacing, excessive movement: Absent Repetitive behaviors, motor, and/or verbal: Absent Rapid, loud, or excessive talking: Absent Sudden changes of mood: Absent Easily initiated or excessive crying and/or laughter: Absent Self-abusiveness, physical and/or verbal: Absent Agitated behavior scale total score: 15   Therapy/Group: Individual Therapy  Curtis Sites 08/03/2020, 12:16 PM

## 2020-08-03 NOTE — Progress Notes (Signed)
Physical Therapy TBI Note  Patient Details  Name: [name] [name] MRN: [id number] Date of Birth: [birth year]  Today's Date: 08/03/2020 PT Individual Time: 1415-1540 PT Individual Time Calculation (min): 85 min   Short Term Goals: Week 1:  PT Short Term Goal 1 (Week 1): Pt will perform supine<>sit with CGA PT Short Term Goal 2 (Week 1): Pt will perform sit<>stands using LRAD with CGA PT Short Term Goal 3 (Week 1): Pt will perform bed<>chair transfers using LRAD with CGA PT Short Term Goal 4 (Week 1): Pt will ambulate at least 126ft using LRAD with CGA PT Short Term Goal 5 (Week 1): Pt will participate in standardized outcome measure to assess fall risk  Skilled Therapeutic Interventions/Progress Updates:     Patient in bed asleep with his wife in the room upon PT arrival. Patient alert and agreeable to PT session. Patient denied pain during session. Reported mild "light headedness" with initial mobility that resolved during session. Patient initially difficult to arouse to verbal and tactile stimulation. Sat HOB up to 80 degrees and provided gentle sternal rub and patient slowly aroused. Patient non-verbal initially then became more verbal as session progressed. Noted moderate impulsivity and difficulty following >1 step commands throughout session. Patient oriented x4 using wall calendar and watch as strategies for date and day of the week without cues.   Therapeutic Activity: Bed Mobility: Patient attempted to use the urinal with his wife assisting, as patient has urinary frequency and urgency. Patient unsuccessful with voiding with notable external distraction when attempting to perform task. Donned shorts with total A bed level. Patient attempted to bridge x2, unable to clear R buttock and performed rolling with supervision to pull pants up. Patient performed supine to/from sit with min A-close supervision in a flat bed with min use of bed rail. Provided verbal cues for rolling his shoulders  forward prior to sitting up to improved trunk control to come to sitting.  Transfers: Patient performed sit to/from stand x12 with CGA-close supervision for balance/safety/impulsivity. Patient stood impulsively x2. Provided cues to wait for therapist prior to standing and patient in agreement and no longer standing impulsively during session.  Patient performed an ambulatory transfer to the bathroom with min A without AD. Patient successfully voided in standing with min A for standing balance. Performed lower-body clothing management with supervision for cues to manage button and zipper. Patient's wife provided patient with toilet tissue and patient performed peri-care with supervision. Doffed shorts in standing at end of session due to urinary frequency/urgency. Donned/doffed L sock with supervision for cues for figure fore sitting and trunk control due to posterior bias and R sock with total A due to patient being unable to achieve figure four sitting with R leg. Donned/doffed B tennis shoes with max A with patient attempting to use his personal shoe horn with facilitation for placement.    Gait Training:  Provided patient with RW with increased hight for improved posture and mechanics with gait training. Patient ambulated 267 feet using RW with CGA-min A and w/c follow for safety. Ambulated with forward trunk lean, downward head gaze, veering L with poor L attention and awareness, decreased R foot clearance due to reduced hamstring activation in pre-swing, decreased R knee/hip extension in stance, decreased R step length, and significantly decreased gait speed. Provided verbal cues for looking ahead, attending to gait due to external distraction in high stim environment, increased R knee flexion/hamstring activation in swing for improved foot clearance, and attention to L side to  avoid bumping into the wall and increased awareness when veering L.  Attempted to initiate 6 Minute Walk Test during gait  training, however, patient unable to attend to instructions for testing and initiate gait prior to therapist initiating the test.   Neuromuscular Re-ed: Patient performed the Berg Balance Test: Patient demonstrates increased fall risk as noted by score of 22/56 on Berg Balance Scale.  (<36= high risk for falls, close to 100%; 37-45 significant >80%; 46-51 moderate >50%; 52-55 lower >25%) Educated patient and his wife on results and interpretation of assessment score and goals to demonstrate improvement prior to d/c. Also educated on PT goals, POC, scheduling, and team conference during session. Provided general TBI education with patient identifying some of his deficits appropriately and discussed interventions and coping strategies that would be used throughout the patient's stay.   Patient in bed with his wife in the room at end of session with breaks locked, bed and Posey belt alarm set, Telesitter in the room, and all needs within reach.    Therapy Documentation Precautions:  Precautions Precautions: Fall,Other (comment) Precaution Comments: posey belt, mild LUE weakness, confusion, R lean in standing, SBP <160 Restrictions Weight Bearing Restrictions: No Agitated Behavior Scale: TBI Observation Details Observation Environment: CIR Start of observation period - Date: 08/03/20 Start of observation period - Time: 1415 End of observation period - Date: 08/03/20 End of observation period - Time: 1540 Agitated Behavior Scale (DO NOT LEAVE BLANKS) Short attention span, easy distractibility, inability to concentrate: Present to a moderate degree Impulsive, impatient, low tolerance for pain or frustration: Present to a slight degree Uncooperative, resistant to care, demanding: Absent Violent and/or threatening violence toward people or property: Absent Explosive and/or unpredictable anger: Absent Rocking, rubbing, moaning, or other self-stimulating behavior: Absent Pulling at tubes,  restraints, etc.: Absent Wandering from treatment areas: Absent Restlessness, pacing, excessive movement: Absent Repetitive behaviors, motor, and/or verbal: Absent Rapid, loud, or excessive talking: Absent Sudden changes of mood: Absent Easily initiated or excessive crying and/or laughter: Absent Self-abusiveness, physical and/or verbal: Absent Agitated behavior scale total score: 17  Balance: Standardized Balance Assessment Standardized Balance Assessment: Berg Balance Test Berg Balance Test Sit to Stand: Able to stand  independently using hands Standing Unsupported: Able to stand 2 minutes with supervision Sitting with Back Unsupported but Feet Supported on Floor or Stool: Able to sit 2 minutes under supervision Stand to Sit: Controls descent by using hands Transfers: Needs one person to assist Standing Unsupported with Eyes Closed: Able to stand 10 seconds with supervision Standing Ubsupported with Feet Together: Needs help to attain position but able to stand for 30 seconds with feet together From Standing, Reach Forward with Outstretched Arm: Can reach forward >12 cm safely (5") From Standing Position, Pick up Object from Floor: Unable to try/needs assist to keep balance From Standing Position, Turn to Look Behind Over each Shoulder: Needs assist to keep from losing balance and falling Turn 360 Degrees: Needs assistance while turning Standing Unsupported, Alternately Place Feet on Step/Stool: Needs assistance to keep from falling or unable to try Standing Unsupported, One Foot in Front: Needs help to step but can hold 15 seconds Standing on One Leg: Tries to lift leg/unable to hold 3 seconds but remains standing independently Total Score: 22   Therapy/Group: Individual Therapy  Jadasia Haws L Cindie Rajagopalan PT, DPT  08/03/2020, 5:11 PM

## 2020-08-03 NOTE — Care Management (Signed)
[facility] Individual Statement of Services  Patient Name:  [name] [name]  Date:  08/03/2020  Welcome to the [facility].  Our goal is to provide you with an individualized program based on your diagnosis and situation, designed to meet your specific needs.  With this comprehensive rehabilitation program, you will be expected to participate in at least 3 hours of rehabilitation therapies Monday-Friday, with modified therapy programming on the weekends.  Your rehabilitation program will include the following services:  Physical Therapy (PT), Occupational Therapy (OT), Speech Therapy (ST), 24 hour per day rehabilitation nursing, Therapeutic Recreaction (TR), Psychology, Neuropsychology, Care Coordinator, Rehabilitation Medicine, Nutrition Services, Pharmacy Services and Other  Weekly team conferences will be held on Tuesdays to discuss your progress.  Your Inpatient Rehabilitation Care Coordinator will talk with you frequently to get your input and to update you on team discussions.  Team conferences with you and your family in attendance may also be held.  Expected length of stay: 14-17 days   Overall anticipated outcome: Supervision  Depending on your progress and recovery, your program may change. Your Inpatient Rehabilitation Care Coordinator will coordinate services and will keep you informed of any changes. Your Inpatient Rehabilitation Care Coordinator's name and contact numbers are listed  below.  The following services may also be recommended but are not provided by the [facility] will be made to provide these services after discharge if needed.  Arrangements include referral to agencies that provide these services.  Your insurance has been verified to be:  Medicare A/B  Your  primary doctor is:  Scarlette Calico  Pertinent information will be shared with your doctor and your insurance company.  Inpatient Rehabilitation Care Coordinator:  Cathleen Corti [phone] or [phone]  Information discussed with and copy given to patient by: Rana Snare, 08/03/2020, 9:49 AM

## 2020-08-03 NOTE — Progress Notes (Signed)
Speech Language Pathology TBI Note  Patient Details  Name: [name] [name] MRN: [id number] Date of Birth: August 12, 1940  Today's Date: 08/03/2020 SLP Individual Time: 1018-1100 SLP Individual Time Calculation (min): 42 min  Short Term Goals: Week 1: SLP Short Term Goal 1 (Week 1): Pt will increased orientation to time, situation and recent medical events with 100% accuracy min A cues SLP Short Term Goal 2 (Week 1): Pt will complete mildly complex problem solving tasks with 75% accuracy mod A cues SLP Short Term Goal 3 (Week 1): Pt will increase recall of functional and novel information with 70% accuracy provided mod A cues SLP Short Term Goal 4 (Week 1): Pt will increase sustained attention for functional tasks to 10 minutes provided min A cues  Skilled Therapeutic Interventions:Skilled ST services focused on cognitive skills. Pt's wife was present and reports fatigue further impacting cognitive skills compare to am session with OT. Pt was orientated to person only. SLP posted location sign along side calendar in room. Pt demonstrated recall of orientation with mod A verbal cues for visual aids to recall locate and time. SLP also facilitated sustained attention and basic problem solving skills in sorting change task, pt was able to sort coins with min A verbal cues and count/display simple change with appropriate problem solving however due to poor attention and awareness would continue to add coins until directed. Pt was able to sort cards by color in a field of 3 up to a field of 6 with supervision A verbal cues for problem solving, total A for error awareness and sustained attention in 5 minute intervals. Pt was left in room with wife, call bell within reach and bed alarm set. SLP recommends to continue skilled services.     Pain Pain Assessment Pain Score: 0-No pain  Agitated Behavior Scale: TBI Observation Details Observation Environment: CIR Start of observation period - Date:  08/03/20 Start of observation period - Time: 0730 End of observation period - Date: 08/03/20 End of observation period - Time: 0839 Agitated Behavior Scale (DO NOT LEAVE BLANKS) Short attention span, easy distractibility, inability to concentrate: Present to a slight degree Impulsive, impatient, low tolerance for pain or frustration: Absent Uncooperative, resistant to care, demanding: Absent Violent and/or threatening violence toward people or property: Absent Explosive and/or unpredictable anger: Absent Rocking, rubbing, moaning, or other self-stimulating behavior: Absent Pulling at tubes, restraints, etc.: Absent Wandering from treatment areas: Absent Restlessness, pacing, excessive movement: Absent Repetitive behaviors, motor, and/or verbal: Absent Rapid, loud, or excessive talking: Absent Sudden changes of mood: Absent Easily initiated or excessive crying and/or laughter: Absent Self-abusiveness, physical and/or verbal: Absent Agitated behavior scale total score: 15  Therapy/Group: Individual Therapy  [name] [name]  [facility] 08/03/2020, 2:09 PM

## 2020-08-04 LAB — MAGNESIUM: Magnesium: 2.2 mg/dL (ref 1.7–2.4)

## 2020-08-04 LAB — HEMOGLOBIN A1C
Hgb A1c MFr Bld: 6.2 % — ABNORMAL HIGH (ref 4.8–5.6)
Mean Plasma Glucose: 131 mg/dL

## 2020-08-04 LAB — GLUCOSE, CAPILLARY
Glucose-Capillary: 145 mg/dL — ABNORMAL HIGH (ref 70–99)
Glucose-Capillary: 166 mg/dL — ABNORMAL HIGH (ref 70–99)
Glucose-Capillary: 227 mg/dL — ABNORMAL HIGH (ref 70–99)
Glucose-Capillary: 145 mg/dL — ABNORMAL HIGH (ref 70–99)

## 2020-08-04 LAB — POTASSIUM: Potassium: 4.3 mmol/L (ref 3.5–5.1)

## 2020-08-04 NOTE — Patient Care Conference (Signed)
Inpatient RehabilitationTeam Conference and Plan of Care Update Date: 08/04/2020   Time: 10:11 AM    Patient Name: [name] [name]      Medical Record Number: [id number]  Date of Birth: 11-Feb-1941 Sex: Male         Room/Bed: 4W10C/4W10C-01 Payor Info: Payor: MEDICARE / Plan: MEDICARE PART A AND B / Product Type: *No Product type* /    Admit Date/Time:  07/31/2020  4:08 PM  Primary Diagnosis:  Subdural hemorrhage following injury [facility])  Hospital Problems: Principal Problem:   Subdural hemorrhage following injury [facility])    Expected Discharge Date: Expected Discharge Date: 08/14/20  Team Members Present: Physician leading conference: Dr. Alger Simons Care Coodinator Present: Loralee Pacas, LCSWA;Joanna Hall Creig Hines, RN, BSN, CRRN Nurse Present: [name], RN PT Present: Apolinar Junes, PT OT Present: Laverle Hobby, OT SLP Present: Weston Anna, SLP PPS Coordinator present : Gunnar Fusi, SLP     Current Status/Progress Goal Weekly Team Focus  Bowel/Bladder   continent vs incontinent bladder; continent of bowel. last BM- 5/30  regain cont of bladder  assess q shift and PRN   Swallow/Nutrition/ Hydration             ADL's   Min A overall with deficits in sequencing, attention, orientation. RLAS VI  supervision  d/c planning, ADLs, cognitive retraining, transfers, family edu   Mobility   Min A overall, gait >250 ft with RW, stairs mod A  Supervision overall  Activity tolerance, functional mobility, gait and stair training, balance, attention, path finding, dual task training, patient/caregiver education   Communication             Safety/Cognition/ Behavioral Observations  max-mod A  Min-Supervision A  orientation, sustained attention, basic problem solving and error awareness   Pain   c/o pain 4 of 10- headache. Tylenol is effective  pain<3  assess pain q shift and PRN   Skin   surg insicion to head, staples intact.  proper healing, free of infection.  assess  skin q shift and PRN     Discharge Planning:      Team Discussion: BP and HR controlled, pain controlled. Continent with incontinent episodes of bladder, continent of bowel. Sleeps during day shift. Time orientation is a problem at times. Incision to head is closed with staples and dried blood. He and his wife are scheduled to move to [place] this week, so she will be busy with the move. His son is also moving and he will also be busy with his move. Patient on target to meet rehab goals: yes, min assist overall with ADL's, consistent with a [place] VI. Has supervision goals. Fatigues in between sessions. Walking >200 ft, will practice stairs due to his son's new home having stairs. Min assist gait and bed mobility. Voice is hoarse, A&Ox4, able to set-up own tray. Per his wife there is a cognitive change in the afternoon so SLP will schedule a session in the afternoon.  *See Care Plan and progress notes for long and short-term goals.   Revisions to Treatment Plan:  None at this time.  Teaching Needs: Family education, medication management, pain management, skin/wound care, transfer training, gait training, balance training, endurance training, stair training, safety awareness.  Current Barriers to Discharge: Decreased caregiver support, Medical stability, Home enviroment access/layout, Incontinence, Wound care, Lack of/limited family support, Weight, Medication compliance and Behavior  Possible Resolutions to Barriers: Continue current medications, provide emotional support.     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 bp and hr controlled. pain generally under control.  Barriers to Discharge: Behavior;Medical stability   Possible Resolutions to [employer]: maximize sleep-wake cycle, day time arousal., manage cv, wound issues   Continued Need for Acute Rehabilitation Level of Care: The patient requires daily medical  management by a physician with specialized training in physical medicine and rehabilitation for the following reasons: Direction of a multidisciplinary physical rehabilitation program to maximize functional independence : Yes Medical management of patient stability for increased activity during participation in an intensive rehabilitation regime.: Yes Analysis of laboratory values and/or radiology reports with any subsequent need for medication adjustment and/or medical intervention. : Yes   I attest that I was present, lead the team conference, and concur with the assessment and plan of the team.   Cristi Loron 08/04/2020, 2:53 PM

## 2020-08-04 NOTE — Progress Notes (Signed)
Speech Language Pathology TBI Note  Patient Details  Name: [name] [name] MRN: [id number] Date of Birth: 07-09-1940  Today's Date: 08/04/2020 SLP Individual Time: 6962-9528 SLP Individual Time Calculation (min): 45 min  Short Term Goals: Week 1: SLP Short Term Goal 1 (Week 1): Pt will increased orientation to time, situation and recent medical events with 100% accuracy min A cues SLP Short Term Goal 2 (Week 1): Pt will complete mildly complex problem solving tasks with 75% accuracy mod A cues SLP Short Term Goal 3 (Week 1): Pt will increase recall of functional and novel information with 70% accuracy provided mod A cues SLP Short Term Goal 4 (Week 1): Pt will increase sustained attention for functional tasks to 10 minutes provided min A cues  Skilled Therapeutic Interventions: Skilled treatment session focused on cognitive goals. Upon arrival, patient was asleep but easily awakened. With prompting, patient requested to use the urinal and required Mild verbal cues for problem solving with task. Patient was continent of bladder. SLP facilitated session by providing overall Min A verbal cues for tray set-up with breakfast meal of regular textures with thin liquids. Patient demonstrated alternating attention between self-feeding and a functional conversation for ~30 minutes with supervision level verbal cues. Patient was oriented to person, place and situation independently but required Min verbal and visual cues for orientation to time. Patient also recalled functional information with Mod I. Patient's vocal intensity was initially difficult to understand due to a hoarse vocal quality with low vocal intensity. However, as session progressed, patient's overall vocal intensity improved and patient was ~100% intelligible. Patient left upright in bed with alarm on and all needs within reach. Continue with current plan of care.      Pain No/Denies Pain   Agitated Behavior Scale: TBI Observation  Details Observation Environment: CIR Start of observation period - Date: 08/04/20 Start of observation period - Time: 0730 End of observation period - Date: 08/04/20 End of observation period - Time: 0815 Agitated Behavior Scale (DO NOT LEAVE BLANKS) Short attention span, easy distractibility, inability to concentrate: Present to a slight degree Impulsive, impatient, low tolerance for pain or frustration: Absent Uncooperative, resistant to care, demanding: Absent Violent and/or threatening violence toward people or property: Absent Explosive and/or unpredictable anger: Absent Rocking, rubbing, moaning, or other self-stimulating behavior: Absent Pulling at tubes, restraints, etc.: Absent Wandering from treatment areas: Absent Restlessness, pacing, excessive movement: Absent Repetitive behaviors, motor, and/or verbal: Absent Rapid, loud, or excessive talking: Absent Sudden changes of mood: Absent Easily initiated or excessive crying and/or laughter: Absent Self-abusiveness, physical and/or verbal: Absent Agitated behavior scale total score: 15  Therapy/Group: Individual Therapy  Kimi Kroft 08/04/2020, 9:40 AM

## 2020-08-04 NOTE — Progress Notes (Signed)
PROGRESS NOTE   Subjective/Complaints:  Pt with quiet evening. Slow to arouse this morning per usual.  ROS: Limited due to cognitive/behavioral    Objective:   CT HEAD WO CONTRAST  Result Date: 08/03/2020 CLINICAL DATA:  Follow-up subdural hematoma EXAM: CT HEAD WITHOUT CONTRAST TECHNIQUE: Contiguous axial images were obtained from the base of the skull through the vertex without intravenous contrast. COMPARISON:  Five days ago FINDINGS: Brain: A subdural drain has been removed. Decreased subdural hemorrhage on the right with diminishing cortical mass effect. Decreased postoperative gas. Inter hemispheric subdural and subarachnoid blood is also regressed. Mildly decreased left parietal extra-axial hemorrhage with subjacent edema, hematoma measuring 7 mm in thickness. Minimal intraventricular hemorrhage is non progressed. Contusion like appearance in the right temporal lobe without worrisome mass effect. No acute infarct, hydrocephalus, or masslike finding. Vascular: Negative Skull: Unremarkable right craniotomy. 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 IMPRESSION: Decreasing intracranial hemorrhage.  No new abnormality. Electronically Signed   By: Monte Fantasia M.D.   On: 08/03/2020 09:54   No results for input(s): WBC, HGB, HCT, PLT in the last 72 hours. Recent Labs    08/03/20 0428 08/04/20 0455  NA 134*  --   K 4.0 4.3  CL 101  --   CO2 27  --   GLUCOSE 151*  --   BUN 24*  --   CREATININE 0.81  --   CALCIUM 8.8*  --     Intake/Output Summary (Last 24 hours) at 08/04/2020 1209 Last data filed at 08/04/2020 1125 Gross per 24 hour  Intake 920 ml  Output 726 ml  Net 194 ml        Physical Exam: Vital Signs Blood pressure 131/88, pulse (!) 57, temperature 97.8 F (36.6 C),  temperature source Oral, resp. rate 19, SpO2 98 %.   Constitutional: No distress . Vital signs reviewed. HEENT: EOMI, oral membranes moist Neck: supple Cardiovascular: RRR without murmur. No JVD    Respiratory/Chest: CTA Bilaterally without wheezes or rales. Normal effort    GI/Abdomen: BS +, non-tender, non-distended Ext: no clubbing, cyanosis, or edema Psych: pleasant and cooperative Skin: right crani cdi. Occipital abrasion generally dry.  Neuro:arouses.oriented to person, place , not time. Follows basic commands.  Speech clear. LUE and LLE 4/5. RUE and RLE 4/5. Depth perception deficits.  Musculoskeletal: cervical discomfort with ROM     Assessment/Plan: 1. Functional deficits which require 3+ hours per day of interdisciplinary therapy in a comprehensive inpatient rehab setting.  Physiatrist is providing close team supervision and 24 hour management of active medical problems listed below.  Physiatrist and rehab team continue to assess barriers to discharge/monitor patient progress toward functional and medical goals  Care Tool:  Bathing    Body parts bathed by patient: Right arm,Left arm,Chest,Abdomen,Front perineal area,Buttocks,Right upper leg,Left upper leg,Left lower leg,Face   Body parts bathed by helper: Right lower leg     Bathing assist Assist Level: Minimal Assistance - Patient > 75%     Upper Body Dressing/Undressing Upper body dressing   What is the patient wearing?: Pull over  shirt    Upper body assist Assist Level: Moderate Assistance - Patient 50 - 74%    Lower Body Dressing/Undressing Lower body dressing      What is the patient wearing?: Underwear/pull up,Pants     Lower body assist Assist for lower body dressing: Moderate Assistance - Patient 50 - 74%     Toileting Toileting    Toileting assist Assist for toileting: Maximal Assistance - Patient 25 - 49%     Transfers Chair/bed transfer  Transfers assist     Chair/bed transfer  assist level: Minimal Assistance - Patient > 75%     Locomotion Ambulation   Ambulation assist      Assist level: Minimal Assistance - Patient > 75% Assistive device: Walker-rolling Max distance: 267 ft   Walk 10 feet activity   Assist     Assist level: Minimal Assistance - Patient > 75% Assistive device: Walker-rolling   Walk 50 feet activity   Assist    Assist level: Minimal Assistance - Patient > 75% Assistive device: Walker-rolling    Walk 150 feet activity   Assist    Assist level: Minimal Assistance - Patient > 75% Assistive device: Walker-rolling    Walk 10 feet on uneven surface  activity   Assist Walk 10 feet on uneven surfaces activity did not occur: Safety/medical concerns (pt fatigue)         Wheelchair     Assist Will patient use wheelchair at discharge?: No (TBD but do not anticipate)             Wheelchair 50 feet with 2 turns activity    Assist            Wheelchair 150 feet activity     Assist          Blood pressure 131/88, pulse (!) 57, temperature 97.8 F (36.6 C), temperature source Oral, resp. rate 19, SpO2 98 %.  Medical Problem List and Plan: 1.  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 Pt s/p Right-sided craniotomy 5/24             -patient may shower             -ELOS/Goals: 18-21 days, supervision to min assist goals with PT, OT, SLP  -Continue CIR therapies including PT, OT, and SLP   -Head CT from 5/30 with improvement  -decadron taper 2.  Antithrombotics: -DVT/anticoagulation:  Mechanical: Sequential compression devices, below knee Bilateral lower extremities-pt to resume coumadin at some point over the next week or so. Will f/u with NS later this week. HCT encouraging yesterday             -antiplatelet therapy: N/A 3. Pain Management: Hydrocodone prn.  4. Mood: LCSW to follow for evaluation and support.              -antipsychotic agents: NA 5.  Neuropsych: This patient is not capable of making decisions on his own behalf. 6. Skin/Wound Care: Routine pressure relief measures.  7. Fluids/Electrolytes/Nutrition: I personally reviewed the patient's labs today.    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 Need to Keep K+>4.0 and Mg.1.8.--K+ within range--recheck Monday   -mg++ 2.0 5/30 s/p run iv mg++             -  monitor for syncopal episodes, symptomatic arhythmia              -cards f/u as needed 9. 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              --continue Lasix, Cozaar, metoprolol and prn hydralazine.     Vitals:   08/03/20 2003 08/04/20 0428  BP: 138/83 131/88  Pulse: 68 (!) 57  Resp: 16 19  Temp: 98.5 F (36.9 C) 97.8 F (36.6 C)  SpO2: 99% 98%  fair control 5/31 10. BPH: Resume Flomax and Proscar.  11. Diastolic CHF: Monitor weights daily and for any signs of overload.              --continue Lasix, hydralazine and Lipitor.  There were no vitals filed for this visit.--weights ordered again   12. Pre-diabetes: Hgb A1c-6.3 07/2019. Will monitor BS ac/hs while on steroids and use SSI for elevated BS.   -steroid related             --Added dietary restrictions.  13. Acute blood loss anemia: hgb 10.4 5/28 14. Leukocytosis: afebrile, steroid related-no signs of infection      LOS: 4 days A FACE TO FACE EVALUATION WAS PERFORMED  Meredith Staggers 08/04/2020, 12:09 PM

## 2020-08-04 NOTE — Progress Notes (Addendum)
Patient ID: [name] [name], male   DOB: 04-29-1940, 80 y.o.   MRN: [id number]  SW met with pt, pt wife, and pt son Jeral Fruit called while SW in room providing updates from team conference, and d/c date 6/10. SW discussed coming in for family education on Sunday (6/5) to ensure she will be comfortable with providing care to pt since the d/c Goal is supervision. Pt and her son will be here for family edu on Sunday 1pm-3pm in efforts to make a more informed decision. SW to follow-up on Monday to discuss further. Both are aware SW will follow-up with SW over Independent Living to explore SNF option if needed. Both wife and son are moving into their homes on Monday 6/6.  Loralee Pacas, MSW, [place] Office: [phone] Cell: [phone] Fax: [phone]

## 2020-08-04 NOTE — Progress Notes (Signed)
Occupational Therapy TBI Note  Patient Details  Name: [name] [name] MRN: [id number] Date of Birth: September 18, 1940  Today's Date: 08/04/2020 OT Individual Time: 9892-1194 OT Individual Time Calculation (min): 60 min    Short Term Goals: Week 1:  OT Short Term Goal 1 (Week 1): Pt will complete UB dressing at supervision for two consecutive sessions. OT Short Term Goal 2 (Week 1): Pt will complete LB bathing sit to stand with min guard assist for two sessions. OT Short Term Goal 3 (Week 1): Pt will complete LB dressing sit to stand with min assist. OT Short Term Goal 4 (Week 1): Pt will perform toilet transfers to the elevated toilet with use of the RW or LRAD and min guard assist.  Skilled Therapeutic Interventions/Progress Updates:    Pt received supine with his wife present, no c/o pain. He was agreeable to donning clothes but declined shower. Pt completed bed mobility with min A to EOB. He sat unsupported with supervision and donned shirt with supervision. CGA to don shorts, better sequencing to orient shorts. He donned socks with very poor error recognition, requiring mod cueing for identifying he had not pulled up his R sock all the way. He was able to use a long shoe horn to don his shoes with min A. He stood at the sink and completed oral care for 10+ min (flossing thoroughly, brushing), and began having slight B knee flexion however when OT questioned fatigue he stated no. Eventually he required cueing for termination and to take a seated rest break. Pt ended session with 2 trials of 100 ft of functional mobility at CGA-min A level. He had slight LLE scissoring and a very narrow BOS that he was cued for but had difficulty correcting. Pt returned to his room and was left sitting up in the w/c with the chair alarm belt on.   Therapy Documentation Precautions:  Precautions Precautions: Fall,Other (comment) Precaution Comments: posey belt, mild LUE weakness, confusion, R lean in standing, SBP  <160 Restrictions Weight Bearing Restrictions: No Agitated Behavior Scale: TBI Observation Details Observation Environment: Pt room Start of observation period - Date: 08/04/20 Start of observation period - Time: 0830 End of observation period - Date: 08/04/20 End of observation period - Time: 0930 Agitated Behavior Scale (DO NOT LEAVE BLANKS) Short attention span, easy distractibility, inability to concentrate: Present to a slight degree Impulsive, impatient, low tolerance for pain or frustration: Absent Uncooperative, resistant to care, demanding: Absent Violent and/or threatening violence toward people or property: Absent Explosive and/or unpredictable anger: Absent Rocking, rubbing, moaning, or other self-stimulating behavior: Absent Pulling at tubes, restraints, etc.: Absent Wandering from treatment areas: Absent Restlessness, pacing, excessive movement: Absent Repetitive behaviors, motor, and/or verbal: Absent Rapid, loud, or excessive talking: Absent Sudden changes of mood: Absent Easily initiated or excessive crying and/or laughter: Absent Self-abusiveness, physical and/or verbal: Absent Agitated behavior scale total score: 15   Therapy/Group: Individual Therapy  Curtis Sites 08/04/2020, 12:38 PM

## 2020-08-04 NOTE — Progress Notes (Signed)
Inpatient Rehabilitation  Patient information reviewed and entered into eRehab system by Lilyian Quayle M. Rhyse Loux, M.A., CCC/SLP, PPS Coordinator.  Information including medical coding, functional ability and quality indicators will be reviewed and updated through discharge.    

## 2020-08-04 NOTE — Progress Notes (Addendum)
Physical Therapy Session Note  Patient Details  Name: [name] [name] MRN: [id number] Date of Birth: 1940-04-11  Today's Date: 08/04/2020 PT Individual Time: 5366-4403 PT Individual Time Calculation (min): 82 min   Short Term Goals: Week 1:  PT Short Term Goal 1 (Week 1): Pt will perform supine<>sit with CGA PT Short Term Goal 2 (Week 1): Pt will perform sit<>stands using LRAD with CGA PT Short Term Goal 3 (Week 1): Pt will perform bed<>chair transfers using LRAD with CGA PT Short Term Goal 4 (Week 1): Pt will ambulate at least 111ft using LRAD with CGA PT Short Term Goal 5 (Week 1): Pt will participate in standardized outcome measure to assess fall risk  Skilled Therapeutic Interventions/Progress Updates:     Patient in bed with his wife in the room upon PT arrival. Patient alert and agreeable to PT session. Patient denied pain during session.  Therapeutic Activity: Bed Mobility: Patient performed supine to/from sit with CGA-supervision in a flat bed without use of bed rails. Provided verbal cues for brining legs off the bed and rolling to L side-lying to use upper extremities to push up to sitting and reverse for lying. Patient donned shoes with min A and increased time using personal shoe horn sitting EOB. Provided cues for sequencing x2 and placement of shoe horn x1. Transfers: Patient performed sit to/from stand x6 with CGA-close supervision with RW and min A without RW. Provided verbal cues for hand placement, reaching back to control descent, safety awareness and impulsivity, and forward weight shift.  Gait Training:  Patient ambulated >75 feet and 404 feet using RW with CGA. Ambulated with narrow BOS with feet brushing together, significantly decreased gait speed, step-height R>L, and step length R>L, with intermittent toe drag on R, and forward flexed posture . Provided verbal cues for erect posture, increased BOS, increased hamstring activation in pre-swing for improved R foot  clearance, and increased gait speed (utilized upbeat music of patient's selection on second trial for improved gait speed). Between gait trials patient performed ambulation without and AD with min A with 8 cones lined up and patient cued to place one foot on either side of the cones while walking forward without hitting the cones for improved BOS with giat 4x52ft. Patient also performed R foot placement on 8" step 3x10 without upper extremity support focused on R hamstring activation to clear the step and when lifting his foot back off for improved R foot clearance during gait. Noted improvement with these gait deviations the first 50% of second gait trial, returned to original gait pattern with fatigue and decreased attention to task.  Vitals following second gait trial: BP 118/82, HR 72, SPO2 99% (asymptomatic)  Patient in bed with his wife in the room at end of session with breaks locked, bed and Posey belt alarm set, Telesitter in place, and all needs within reach.   Therapy Documentation Precautions:  Precautions Precautions: Fall,Other (comment) Precaution Comments: posey belt, mild LUE weakness, confusion, R lean in standing, SBP <160 Restrictions Weight Bearing Restrictions: No Agitated Behavior Scale: TBI Observation Details Observation Environment: Pt room Start of observation period - Date: 08/04/20 Start of observation period - Time: 1435 End of observation period - Date: 08/04/20 End of observation period - Time: 1557 Agitated Behavior Scale (DO NOT LEAVE BLANKS) Short attention span, easy distractibility, inability to concentrate: Present to a slight degree Impulsive, impatient, low tolerance for pain or frustration: Absent Uncooperative, resistant to care, demanding: Absent Violent and/or threatening violence toward people  or property: Absent Explosive and/or unpredictable anger: Absent Rocking, rubbing, moaning, or other self-stimulating behavior: Absent Pulling at tubes,  restraints, etc.: Absent Wandering from treatment areas: Absent Restlessness, pacing, excessive movement: Absent Repetitive behaviors, motor, and/or verbal: Absent Rapid, loud, or excessive talking: Absent Sudden changes of mood: Absent Easily initiated or excessive crying and/or laughter: Absent Self-abusiveness, physical and/or verbal: Absent Agitated behavior scale total score: 15   Therapy/Group: Individual Therapy  Caylei Sperry L Cuca Benassi PT, DPT  08/04/2020, 8:24 PM

## 2020-08-05 LAB — GLUCOSE, CAPILLARY
Glucose-Capillary: 142 mg/dL — ABNORMAL HIGH (ref 70–99)
Glucose-Capillary: 122 mg/dL — ABNORMAL HIGH (ref 70–99)
Glucose-Capillary: 147 mg/dL — ABNORMAL HIGH (ref 70–99)
Glucose-Capillary: 225 mg/dL — ABNORMAL HIGH (ref 70–99)

## 2020-08-05 LAB — POTASSIUM: Potassium: 4.4 mmol/L (ref 3.5–5.1)

## 2020-08-05 LAB — MAGNESIUM: Magnesium: 2.1 mg/dL (ref 1.7–2.4)

## 2020-08-05 MED ORDER — DEXAMETHASONE 2 MG PO TABS
2.0000 mg | ORAL_TABLET | Freq: Two times a day (BID) | ORAL | Status: DC
  Administered 2020-08-05 – 2020-08-06 (×3): 2 mg via ORAL
  Filled 2020-08-05 (×4): qty 1

## 2020-08-05 NOTE — Plan of Care (Signed)
Behavioral Plan   [place]o Level: VI  Behavior to decrease/ eliminate:  -impulsivity -lethargy -perseveration on phone  Changes to environment:  -blinds-up, lights on during the day -music on, no TV  Interventions: -Posey belt -Telesitter  Recommendations for interactions with patient: -provide orientation when appropriate  Attendees: Apolinar Junes, PT, Laverle Hobby, OT, Mariane Masters, Weston Anna, SLP

## 2020-08-05 NOTE — Progress Notes (Signed)
Speech Language Pathology TBI Note  Patient Details  Name: [name] [name] MRN: [id number] Date of Birth: Sep 07, 1940  Today's Date: 08/05/2020 SLP Individual Time: 1300-1345 SLP Individual Time Calculation (min): 45 min  Short Term Goals: Week 1: SLP Short Term Goal 1 (Week 1): Pt will increased orientation to time, situation and recent medical events with 100% accuracy min A cues SLP Short Term Goal 2 (Week 1): Pt will complete mildly complex problem solving tasks with 75% accuracy mod A cues SLP Short Term Goal 3 (Week 1): Pt will increase recall of functional and novel information with 70% accuracy provided mod A cues SLP Short Term Goal 4 (Week 1): Pt will increase sustained attention for functional tasks to 10 minutes provided min A cues  Skilled Therapeutic Interventions: Skilled treatment session focused on cognitive goals. SLP facilitated session by providing extra time and overall Mod-Max a verbal and visual cues for organization, problem solving and error awareness during a mildly complex calendar task. Patient reported that he usually utilizes the phone to maximize recall of appointments, therefore, will attempt using his phone tomorrow when his wife present (wife has phone). Patient handed off to PT. Continue with current plan of care.      Pain No/Denies Pain   Agitated Behavior Scale: TBI Observation Details Observation Environment: Patient's room Start of observation period - Date: 08/05/20 Start of observation period - Time: 1300 End of observation period - Date: 08/05/20 End of observation period - Time: 1345 Agitated Behavior Scale (DO NOT LEAVE BLANKS) Short attention span, easy distractibility, inability to concentrate: Present to a slight degree Impulsive, impatient, low tolerance for pain or frustration: Absent Uncooperative, resistant to care, demanding: Absent Violent and/or threatening violence toward people or property: Absent Explosive and/or unpredictable  anger: Absent Rocking, rubbing, moaning, or other self-stimulating behavior: Absent Pulling at tubes, restraints, etc.: Absent Wandering from treatment areas: Absent Restlessness, pacing, excessive movement: Absent Repetitive behaviors, motor, and/or verbal: Absent Rapid, loud, or excessive talking: Absent Sudden changes of mood: Absent Easily initiated or excessive crying and/or laughter: Absent Self-abusiveness, physical and/or verbal: Absent Agitated behavior scale total score: 15  Therapy/Group: Individual Therapy  Lasheika Ortloff 08/05/2020, 4:16 PM

## 2020-08-05 NOTE — Progress Notes (Signed)
Physical Therapy TBI Note  Patient Details  Name: [name] [name] MRN: [id number] Date of Birth: September 15, 1940  Today's Date: 08/05/2020 PT Individual Time: [id number] and 1345-1400 PT Individual Time Calculation (min): 75 min and 15 min   Short Term Goals: Week 1:  PT Short Term Goal 1 (Week 1): Pt will perform supine<>sit with CGA PT Short Term Goal 2 (Week 1): Pt will perform sit<>stands using LRAD with CGA PT Short Term Goal 3 (Week 1): Pt will perform bed<>chair transfers using LRAD with CGA PT Short Term Goal 4 (Week 1): Pt will ambulate at least 163ft using LRAD with CGA PT Short Term Goal 5 (Week 1): Pt will participate in standardized outcome measure to assess fall risk  Skilled Therapeutic Interventions/Progress Updates:     Session 1: Patient in bed finishing breakfast with RN providing supervision upon PT arrival. Patient alert and agreeable to PT session. Patient denied pain during session. Patient's wife arrived shortly after the start of the session.   Patient's wife expressed questions/concerns about patient perseverating on having his phone, to the point of mild agitation with her yesterday. Also, described a second moment of agitation with bladder incontinence and waiting for nursing staff to come to assist. PT educated on TBI agitation and behaviors and use of redirection rather then engaging the behaviors. Provided example for how to redirect patient effectively during these times. Patient did perseverate on asking for his phone x1 during session, able to redirect with an explanation of the phone being a distraction and over stimulation with use and need for focus on therapies while in rehab.  Therapeutic Activity: Bed Mobility: Patient performed supine to sit with supervision with min use of bed rail with bed flat. Provided verbal cues for rolling his top shoulder forward to improved balance with coming to sitting. Patient sat EOB and threaded his legs through his pants and  donned socks and shoes with a shoe horn sitting EOB with supervision. Patient lost his balance in R figure fore sitting falling to the R, able to return to sitting with supervision. PT blocked the patient's R leg in figure four sitting due to decreased R hip ROM.  Transfers: Patient performed sit to/from stand from bed x3, mat table x5, and toilet x2 with CGA progressing to supervision without AD. Provided verbal cues for forward weight shift. Patient with improved safety awareness and impulsivity throughout session. Patient pulled up his pants in standing managing button and zipper x3 with increased time without cues. Patient able to don/doff a belt with mod A to initiate then supervision with min cues. Patient was continent of bladder x1 and bowl x1 during session. Patient performed peri-care with supervision and mod cues for thoroughness and cleanliness. Donned incontinence brief with total A and shorts with supervision.    Gait Training:  Patient ambulated >150 feet x2 without AD with CGA-min A. Ambulated with narrow BOS with feet brushing together, significantly decreased gait speed, step-height R>L, and step length R>L, with intermittent toe drag on R. Patient able to teach back focusing on increased BOS and needed max cues to recall working on R foot clearance prior to gait training. Provided verbal cues for erect posture, increased BOS, increased hamstring activation in pre-swing for improved R foot clearance, and increased gait speed.  Performed the following interventions between gait trials: -ambulating forwards and backwards 15 ft in front of a mirror x5 focused on visual feedback for increased BOS, increased step length and height, increased gait speed, and B  arm swing Patient with improved gait deviations on second gait trial following interventions above with only 1 LOB requiring min A to correct.  Patient in w/c with his wife in the room at end of session with breaks locked, seat belt alarm  set, Telesitter in place and all needs within reach.   Session 2: Patient in w/c in the room and handed off from [place], [place], upon PT arrival. Patient alert and agreeable to PT session. Patient denied pain during session. Reported mild fatigue from sitting up in the w/c since this morning  Therapeutic Activity: Bed Mobility: Patient performed sit to supine with supervision in a flat bed without use of bedrails. Provided verbal cues for scooting back from the EOB before initiating  Mobility for safety. Transfers: Patient performed sit to/from stand x1 with close supervision. Provided verbal cues for forward weight shift. Patient donned belt through his shorts in standing with max cues for placing the belt through the loops and total A for clasping buckle this session, despite several attempts with max cues.  Gait Training:  Patient ambulated >200 feet as above with increased R lean, patient reaching out with R hand for wall rails when able. R lean increased on return to room and required under arm assist on R with mod A for balance due to increased fatigue. Patient retrieved a paper form the nursing station during ambulation and held the paper in his L hand on return to his room.  Patient in bed at end of session with breaks locked, bed and Posey belt alarm set, Telesitter in place, and all needs within reach.   Therapy Documentation Precautions:  Precautions Precautions: Fall,Other (comment) Precaution Comments: posey belt, mild LUE weakness, confusion, R lean in standing, SBP <160 Restrictions Weight Bearing Restrictions: No Agitated Behavior Scale: TBI Observation Details Observation Environment: Patient room Start of observation period - Date: 08/05/20 Start of observation period - Time: 0800 End of observation period - Date: 08/05/20 End of observation period - Time: 0915 Agitated Behavior Scale (DO NOT LEAVE BLANKS) Short attention span, easy distractibility, inability to  concentrate: Present to a slight degree Impulsive, impatient, low tolerance for pain or frustration: Absent Uncooperative, resistant to care, demanding: Absent Violent and/or threatening violence toward people or property: Absent Explosive and/or unpredictable anger: Absent Rocking, rubbing, moaning, or other self-stimulating behavior: Absent Pulling at tubes, restraints, etc.: Absent Wandering from treatment areas: Absent Restlessness, pacing, excessive movement: Absent Repetitive behaviors, motor, and/or verbal: Absent Rapid, loud, or excessive talking: Absent Sudden changes of mood: Absent Easily initiated or excessive crying and/or laughter: Absent Self-abusiveness, physical and/or verbal: Absent Agitated behavior scale total score: 15  Agitated Behavior Scale: TBI Observation Details Observation Environment: Patient room Start of observation period - Date: 08/05/20 Start of observation period - Time: 1345 End of observation period - Date: 08/05/20 End of observation period - Time: 1400 Agitated Behavior Scale (DO NOT LEAVE BLANKS) Short attention span, easy distractibility, inability to concentrate: Present to a slight degree Impulsive, impatient, low tolerance for pain or frustration: Absent Uncooperative, resistant to care, demanding: Absent Violent and/or threatening violence toward people or property: Absent Explosive and/or unpredictable anger: Absent Rocking, rubbing, moaning, or other self-stimulating behavior: Absent Pulling at tubes, restraints, etc.: Absent Wandering from treatment areas: Absent Restlessness, pacing, excessive movement: Absent Repetitive behaviors, motor, and/or verbal: Absent Rapid, loud, or excessive talking: Absent Sudden changes of mood: Absent Easily initiated or excessive crying and/or laughter: Absent Self-abusiveness, physical and/or verbal: Absent Agitated behavior scale total score:  15  Therapy/Group: Individual Therapy  Shakayla Hickox L  Chanoch Mccleery PT, DPT  08/05/2020, 4:07 PM

## 2020-08-05 NOTE — Progress Notes (Signed)
Occupational Therapy TBI Note  Patient Details  Name: [name] [name] MRN: [id number] Date of Birth: [birth year]  Today's Date: 08/05/2020 OT Individual Time: 0930-1040 OT Individual Time Calculation (min): 70 min    Short Term Goals: Week 1:  OT Short Term Goal 1 (Week 1): Pt will complete UB dressing at supervision for two consecutive sessions. OT Short Term Goal 2 (Week 1): Pt will complete LB bathing sit to stand with min guard assist for two sessions. OT Short Term Goal 3 (Week 1): Pt will complete LB dressing sit to stand with min assist. OT Short Term Goal 4 (Week 1): Pt will perform toilet transfers to the elevated toilet with use of the RW or LRAD and min guard assist.  Skilled Therapeutic Interventions/Progress Updates:    Pt received sitting up in the w/c with his wife present, pt lethargic initially but quickly perked up with activity. He requested to take a shower. Ambulatory transfer in room with RW with CGA- supervision. Pt with poor sequencing, attention, and spatial awareness, becoming confused when ambulating to the bathroom and unable to correct without mod cueing for reorientation to task at hand. Min A to doff socks/shoes. Bathing completed at shower level with CGA for standing balance support but no further assist. Pt did require frequent intervention to not attempt to wash his hair. Pt completed transfer to [facility] to dress following shower. He donned shirt with set up assist. Min A to don shorts. He returned to w/c and OT spent extra time getting dried blood off of his head for hygiene and skin care. Pt returned to supine in bed with CGA. He was left with all needs met, posey belt on and bed alarm set.   Therapy Documentation Precautions:  Precautions Precautions: Fall,Other (comment) Precaution Comments: posey belt, mild LUE weakness, confusion, R lean in standing, SBP <160 Restrictions Weight Bearing Restrictions: No   Agitated Behavior Scale: TBI Observation  Details Observation Environment: Patient room Start of observation period - Date: 08/05/20 Start of observation period - Time: 0930 End of observation period - Date: 08/05/20 End of observation period - Time: 1040 Agitated Behavior Scale (DO NOT LEAVE BLANKS) Short attention span, easy distractibility, inability to concentrate: Present to a slight degree Impulsive, impatient, low tolerance for pain or frustration: Absent Uncooperative, resistant to care, demanding: Absent Violent and/or threatening violence toward people or property: Absent Explosive and/or unpredictable anger: Absent Rocking, rubbing, moaning, or other self-stimulating behavior: Absent Pulling at tubes, restraints, etc.: Absent Wandering from treatment areas: Absent Restlessness, pacing, excessive movement: Absent Repetitive behaviors, motor, and/or verbal: Absent Rapid, loud, or excessive talking: Absent Sudden changes of mood: Absent Easily initiated or excessive crying and/or laughter: Absent Self-abusiveness, physical and/or verbal: Absent Agitated behavior scale total score: 15   Therapy/Group: Individual Therapy  Curtis Sites 08/05/2020, 12:29 PM

## 2020-08-05 NOTE — Progress Notes (Signed)
PROGRESS NOTE   Subjective/Complaints:  Pt awake. Says he slept well. Denies any new aches or pains.   ROS: Limited due to cognitive/behavioral    Objective:   CT HEAD WO CONTRAST  Result Date: 08/03/2020 CLINICAL DATA:  Follow-up subdural hematoma EXAM: CT HEAD WITHOUT CONTRAST TECHNIQUE: Contiguous axial images were obtained from the base of the skull through the vertex without intravenous contrast. COMPARISON:  Five days ago FINDINGS: Brain: A subdural drain has been removed. Decreased subdural hemorrhage on the right with diminishing cortical mass effect. Decreased postoperative gas. Inter hemispheric subdural and subarachnoid blood is also regressed. Mildly decreased left parietal extra-axial hemorrhage with subjacent edema, hematoma measuring 7 mm in thickness. Minimal intraventricular hemorrhage is non progressed. Contusion like appearance in the right temporal lobe without worrisome mass effect. No acute infarct, hydrocephalus, or masslike finding. Vascular: Negative Skull: Unremarkable right craniotomy. 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 IMPRESSION: Decreasing intracranial hemorrhage.  No new abnormality. Electronically Signed   By: Monte Fantasia M.D.   On: 08/03/2020 09:54   No results for input(s): WBC, HGB, HCT, PLT in the last 72 hours. Recent Labs    08/03/20 0428 08/04/20 0455 08/05/20 0503  NA 134*  --   --   K 4.0 4.3 4.4  CL 101  --   --   CO2 27  --   --   GLUCOSE 151*  --   --   BUN 24*  --   --   CREATININE 0.81  --   --   CALCIUM 8.8*  --   --     Intake/Output Summary (Last 24 hours) at 08/05/2020 0829 Last data filed at 08/05/2020 0500 Gross per 24 hour  Intake 600 ml  Output 1176 ml  Net -576 ml        Physical Exam: Vital Signs Blood pressure 115/65,  pulse 60, temperature 98 F (36.7 C), resp. rate 18, SpO2 96 %.  Constitutional: No distress . Vital signs reviewed. HEENT: EOMI, oral membranes moist Neck: supple Cardiovascular: RRR without murmur. No JVD    Respiratory/Chest: CTA Bilaterally without wheezes or rales. Normal effort    GI/Abdomen: BS +, non-tender, non-distended Ext: no clubbing, cyanosis, or edema Psych: pleasant and cooperative, fairly engaging today. Skin: right crani cdi. Still small abrasion/drainage behind crani incision but improved.  Neuro:arouses.oriented to person, place , not time. Follows basic commands.  Speech clear. LUE and LLE 4/5. RUE and RLE 4/5. Depth perception deficits.  Musculoskeletal: mild cervical discomfort     Assessment/Plan: 1. Functional deficits which require 3+ hours per day of interdisciplinary therapy in a comprehensive inpatient rehab setting.  Physiatrist is providing close team supervision and 24 hour management of active medical problems listed below.  Physiatrist and rehab team continue to assess barriers to discharge/monitor patient progress toward functional and medical goals  Care Tool:  Bathing    Body parts bathed by patient: Right arm,Left arm,Chest,Abdomen,Front perineal area,Buttocks,Right upper leg,Left upper leg,Left lower leg,Face   Body parts bathed by helper: Right lower leg     Bathing assist  Assist Level: Minimal Assistance - Patient > 75%     Upper Body Dressing/Undressing Upper body dressing   What is the patient wearing?: Pull over shirt    Upper body assist Assist Level: Moderate Assistance - Patient 50 - 74%    Lower Body Dressing/Undressing Lower body dressing      What is the patient wearing?: Underwear/pull up,Pants     Lower body assist Assist for lower body dressing: Moderate Assistance - Patient 50 - 74%     Toileting Toileting    Toileting assist Assist for toileting: Maximal Assistance - Patient 25 - 49%      Transfers Chair/bed transfer  Transfers assist     Chair/bed transfer assist level: Minimal Assistance - Patient > 75%     Locomotion Ambulation   Ambulation assist      Assist level: Minimal Assistance - Patient > 75% Assistive device: Walker-rolling Max distance: 267 ft   Walk 10 feet activity   Assist     Assist level: Minimal Assistance - Patient > 75% Assistive device: Walker-rolling   Walk 50 feet activity   Assist    Assist level: Minimal Assistance - Patient > 75% Assistive device: Walker-rolling    Walk 150 feet activity   Assist    Assist level: Minimal Assistance - Patient > 75% Assistive device: Walker-rolling    Walk 10 feet on uneven surface  activity   Assist Walk 10 feet on uneven surfaces activity did not occur: Safety/medical concerns (pt fatigue)         Wheelchair     Assist Will patient use wheelchair at discharge?: No (TBD but do not anticipate)             Wheelchair 50 feet with 2 turns activity    Assist            Wheelchair 150 feet activity     Assist          Blood pressure 115/65, pulse 60, temperature 98 F (36.7 C), resp. rate 18, SpO2 96 %.  Medical Problem List and Plan: 1.  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 Pt s/p Right-sided craniotomy 5/24             -patient may shower             -ELOS/Goals: 18-21 days, supervision to min assist goals with PT, OT, SLP  -Continue CIR therapies including PT, OT, and SLP   -Head CT from 5/30 with improvement  -decadron taper--decreae further today 2.  Antithrombotics: -DVT/anticoagulation:  Mechanical: Sequential compression devices, below knee Bilateral lower extremities-pt to resume coumadin at some point over the next week or so. Will f/u with NS today. HCT encouraging yesterday             -antiplatelet therapy: N/A 3. Pain Management: Hydrocodone prn.  4. Mood: LCSW to follow for evaluation  and support.              -antipsychotic agents: NA 5. Neuropsych: This patient is not capable of making decisions on his own behalf. 6. Skin/Wound Care: Routine pressure relief measures.  7. Fluids/Electrolytes/Nutrition: I personally reviewed the patient's labs today.     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 Need to Keep K+>4.0  and Mg.1.8.--K+ within range--   6/1 Mg++ being checked daily by pharmacy. Is this needed?             -monitor for syncopal episodes, symptomatic arhythmia              -cards f/u as needed 9. 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              --continue Lasix, Cozaar, metoprolol and prn hydralazine.     Vitals:   08/04/20 1946 08/05/20 0453  BP: (!) 144/90 115/65  Pulse: 70 60  Resp: 18 18  Temp: 97.7 F (36.5 C) 98 F (36.7 C)  SpO2: 96%   fair control 6/1 10. BPH: Resume Flomax and Proscar.  11. Diastolic CHF: Monitor weights daily and for any signs of overload.              --continue Lasix, hydralazine and Lipitor.  There were no vitals filed for this visit.--weights ordered again   12. Pre-diabetes: Hgb A1c-6.3 07/2019. Will monitor BS ac/hs while on steroids and use SSI for elevated BS.   -steroid related             --continue dietary restrictions.  13. Acute blood loss anemia: hgb 10.4 5/28 14. Leukocytosis: afebrile, steroid related-no signs of infection      LOS: 5 days A FACE TO FACE EVALUATION WAS PERFORMED  Meredith Staggers 08/05/2020, 8:29 AM

## 2020-08-06 LAB — GLUCOSE, CAPILLARY
Glucose-Capillary: 111 mg/dL — ABNORMAL HIGH (ref 70–99)
Glucose-Capillary: 102 mg/dL — ABNORMAL HIGH (ref 70–99)
Glucose-Capillary: 153 mg/dL — ABNORMAL HIGH (ref 70–99)
Glucose-Capillary: 134 mg/dL — ABNORMAL HIGH (ref 70–99)

## 2020-08-06 MED ORDER — MAGNESIUM GLUCONATE 500 MG PO TABS
250.0000 mg | ORAL_TABLET | Freq: Every day | ORAL | Status: DC
  Administered 2020-08-06: 250 mg via ORAL
  Filled 2020-08-06 (×2): qty 1

## 2020-08-06 NOTE — Progress Notes (Addendum)
PROGRESS NOTE   Subjective/Complaints:  Up in bed. Wife at bedside. Some confusion last night but improved this morning. Denies any pain except for along scalp, incision  ROS: Patient denies fever, rash, sore throat, blurred vision, nausea, vomiting, diarrhea, cough, shortness of breath or chest pain, joint or back pain.   Objective:   No results found. No results for input(s): WBC, HGB, HCT, PLT in the last 72 hours. Recent Labs    08/04/20 0455 08/05/20 0503  K 4.3 4.4    Intake/Output Summary (Last 24 hours) at 08/06/2020 1039 Last data filed at 08/06/2020 0742 Gross per 24 hour  Intake 520 ml  Output 1475 ml  Net -955 ml        Physical Exam: Vital Signs Blood pressure 134/81, pulse (!) 59, temperature 97.7 F (36.5 C), resp. rate 18, SpO2 96 %.  Constitutional: No distress . Vital signs reviewed. HEENT: EOMI, oral membranes moist Neck: supple Cardiovascular: RRR without murmur. No JVD    Respiratory/Chest: CTA Bilaterally without wheezes or rales. Normal effort    GI/Abdomen: BS +, non-tender, non-distended Ext: no clubbing, cyanosis, or edema Psych: pleasant and cooperative,  Skin: right crani cdi. No drainage  Neuro:arouses.oriented to person, place , follows commands. Fair awareness and insight. Speech clear. LUE and LLE 4/5. RUE and RLE 4/5.    Musculoskeletal: mild cervical discomfort     Assessment/Plan: 1. Functional deficits which require 3+ hours per day of interdisciplinary therapy in a comprehensive inpatient rehab setting.  Physiatrist is providing close team supervision and 24 hour management of active medical problems listed below.  Physiatrist and rehab team continue to assess barriers to discharge/monitor patient progress toward functional and medical goals  Care Tool:  Bathing    Body parts bathed by patient: Right arm,Left arm,Chest,Abdomen,Front perineal area,Buttocks,Right upper  leg,Left upper leg,Left lower leg,Face   Body parts bathed by helper: Right lower leg     Bathing assist Assist Level: Minimal Assistance - Patient > 75%     Upper Body Dressing/Undressing Upper body dressing   What is the patient wearing?: Pull over shirt    Upper body assist Assist Level: Moderate Assistance - Patient 50 - 74%    Lower Body Dressing/Undressing Lower body dressing      What is the patient wearing?: Underwear/pull up,Pants     Lower body assist Assist for lower body dressing: Moderate Assistance - Patient 50 - 74%     Toileting Toileting    Toileting assist Assist for toileting: Maximal Assistance - Patient 25 - 49%     Transfers Chair/bed transfer  Transfers assist     Chair/bed transfer assist level: Minimal Assistance - Patient > 75%     Locomotion Ambulation   Ambulation assist      Assist level: Minimal Assistance - Patient > 75% Assistive device: Walker-rolling Max distance: 267 ft   Walk 10 feet activity   Assist     Assist level: Minimal Assistance - Patient > 75% Assistive device: Walker-rolling   Walk 50 feet activity   Assist    Assist level: Minimal Assistance - Patient > 75% Assistive device: Walker-rolling    Walk 150 feet activity  Assist    Assist level: Minimal Assistance - Patient > 75% Assistive device: Walker-rolling    Walk 10 feet on uneven surface  activity   Assist Walk 10 feet on uneven surfaces activity did not occur: Safety/medical concerns (pt fatigue)         Wheelchair     Assist Will patient use wheelchair at discharge?: No (TBD but do not anticipate)             Wheelchair 50 feet with 2 turns activity    Assist            Wheelchair 150 feet activity     Assist          Blood pressure 134/81, pulse (!) 59, temperature 97.7 F (36.5 C), resp. rate 18, SpO2 96 %.  Medical Problem List and Plan: 1.  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 Pt s/p Right-sided craniotomy 5/24             -patient may shower             -ELOS/Goals: 18-21 days, supervision to min assist goals with PT, OT, SLP  -Continue CIR therapies including PT, OT, and SLP   -Head CT from 5/30 with improvement  -decadron taper--decreased to 2mg  bid yesterday 2.  Antithrombotics: -DVT/anticoagulation:  Mechanical: Sequential compression devices, below knee Bilateral lower extremities-  -HCT demonstrated improvement. I spoke with Dr. Ronnald Ramp who agrees to start coumadin tomorrow. Will not cross cover with heparin or lovenox.             -antiplatelet therapy: N/A 3. Pain Management: Hydrocodone prn.  4. Mood: LCSW to follow for evaluation and support.              -antipsychotic agents: NA 5. Neuropsych: This patient is not capable of making decisions on his own behalf. 6. Skin/Wound Care: Routine pressure relief measures.  7. Fluids/Electrolytes/Nutrition: I personally reviewed the patient's labs today.     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 Need to Keep K+>4.0 and Mg 2.1.--K+ within range--   6/2- add magnesium and continue potassium supplementation             -monitor for syncopal episodes, symptomatic arhythmia              -cards f/u as needed  -recheck labs Monday 9. 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              --continue Lasix, Cozaar, metoprolol and prn hydralazine.     Vitals:   08/05/20 1916 08/06/20 0422  BP: 106/74 134/81  Pulse: 70 (!) 59  Resp: 16 18  Temp: 98.7 F (37.1 C) 97.7 F (36.5 C)  SpO2: 99% 96%  fair control 6/2 10. BPH: Resume Flomax and Proscar.  11. Diastolic CHF: Monitor weights daily and for any signs of  overload.              --continue Lasix, hydralazine and Lipitor.  There were no vitals filed for this visit.--will request weights again   12. Pre-diabetes: Hgb A1c-6.3  07/2019. Will monitor BS ac/hs while on steroids and use SSI for elevated BS.   -steroid related             --continue dietary restrictions.  13. Acute blood loss anemia: hgb 10.4 5/28 14. Leukocytosis: afebrile, steroid related-no signs of infection      LOS: 6 days A FACE TO FACE EVALUATION WAS PERFORMED  Meredith Staggers 08/06/2020, 10:39 AM

## 2020-08-06 NOTE — Progress Notes (Signed)
Patient ID: [name] [name], male   DOB: 1941-01-30, 80 y.o.   MRN: [id number]  SW spoke with Donna/SW with [facility] [phone]) who reported that pt is able to go into healthcare at time of discharge if needed versus going home.   SW met with pt and pt wife on above. Wife reports hospital bed may be needed if their bed does not arrive in time. SW informed will discuss with medical team. She reports that their other son will be coming as well, and possible her son Jody's wife since she is an Therapist, sports. SW informed will share with medical team on concerns related to hospital bed and fam edu.   Loralee Pacas, MSW, [place] Office: [phone] Cell: [phone] Fax: [phone]

## 2020-08-06 NOTE — Progress Notes (Signed)
Physical Therapy TBI Note  Patient Details  Name: [name] [name] MRN: [id number] Date of Birth: 1940-11-10  Today's Date: 08/06/2020 PT Individual Time: 1015-1120 and 800-830 PT Individual Time Calculation (min): 65 min and 30 min  Short Term Goals: Week 1:  PT Short Term Goal 1 (Week 1): Pt will perform supine<>sit with CGA PT Short Term Goal 2 (Week 1): Pt will perform sit<>stands using LRAD with CGA PT Short Term Goal 3 (Week 1): Pt will perform bed<>chair transfers using LRAD with CGA PT Short Term Goal 4 (Week 1): Pt will ambulate at least 167ft using LRAD with CGA PT Short Term Goal 5 (Week 1): Pt will participate in standardized outcome measure to assess fall risk Week 2:    Week 3:    c Therapy Documentation Precautions:  Precautions Precautions: Fall,Other (comment) Precaution Comments: posey belt, mild LUE weakness, confusion, R lean in standing, SBP <160 Restrictions Weight Bearing Restrictions: No SESSION ONE: Pain: C/O SLIGHT HEADACHE 2/5  TREATMENT TO TOLERANCE. Pain Assessment Pain Scale: 0-10 Pain Score: 0-No pain Agitated Behavior Scale: 16 TBI   Pt dons shorts w/set up and min assist.  Supine to sit w/cues for sequencing. Dons shoes in sitting w/set up only via wiggling feet into shoes on floor. Sit to stand w/cga and pt zips/buttons pants. Gait 179ft w/min assist, mild R lean, decreased step length RLE.  Cues to focus on heelstrike RLE improves gait pattern/decreased lean but shortlived attention to task. Standing balance/dual task:  Pt worked on wt shiftting first to L and back to midline via reaching for randomly called numbered tiles on wall, then progressed to crossbody reaching w/randomly called numbers and alternating use of L/R UE for wt shifting L/R/midline, cga for all, slowed w/reaching to far L esp w/crossbody but able to perform w/cga only. Gait 111ft w/min assist, cues/deviations as above.  Pt attempts to reach for rail on L during gait, discouraged  and discussed importance of re-learning midline w/gait to decrease risk of falls. Pt transferred back to edge of bed w/min assist, sit to supine w/cues for safety.   Pt left supine w/rails up x 3, pevic restraint fastened, alarm set, bed in lowest position, and needs in reach.  Wife at bedside.   Session Two PAIN DENIES PAIN THIS SESSION  Agitated Behavior Scale: 16  Pt initially oob in wc w/wife at side.  Pt dons shoes using shoe horn w/set up assist. Transported to main gym for NMRE, gait, vbalance activities.  In parallel bars worked on sidestepping, pt sidesteps L/R w/cga to mod assist, cues to attend to task in busy environment/easily distracted.  Difficulty w/sidestepping to L>R, pt grabs for bars prematurely therefore insrturcted to hold handw up at shoulder height to deter.  Standing tapping 5in cones alternating fashion first w/bilat UE support - decreased coordination noted, cga Single UE support cga No Ue support - max assist  Gait 11ft w/significant increase in RLE clearance deficit, decreased step length, min to occasional mod assist for balance, decreased attention to task.  stand pivot transfer to couch w/min assist, mod cues for safety.  Pt rested in sitting then repeated gait as above, w/decreased attention/carryover of wt shifting performance min assist and use of rail on L/pt unable to deter pt.  Standing widestepping over 5-6in wide obstacle (8lb cuffwt) initially unable to step wide enough for bilat foot clearance due to difficulty fully wt shifting  but improved w/mult repetitions, uses parallel bar for balance.  Gait w/RW x 214ft  w/increased R lean, varying cues to promote clearance, step length of RLE including counting "10 steps" to aid attention to task, marching, increasing gait speed to targets at 14ft intervals.  Pt easily distracted w/very short attention span.  Wife observes this portion of session.    Wife w/questions regarding evening TV viewing.  States  pt enjoys history channel.  Describes this as a relaxing activity for pt.  Discussed timing during low stim hours, viewing for short periods/single show, only when pt not appearing agitated/confused/oversimluated.  Suggested limiting to < 1 hr to avoid overstimulation and to monitor content.  Pt left supine w/rails up x 3, waist restraint fastened for safety, alarm set, bed in lowest position, and needs in reach.    Therapy/Group: Individual Therapy  Callie Fielding, [place] 08/06/2020, 12:36 PM

## 2020-08-06 NOTE — Progress Notes (Signed)
Speech Language Pathology TBI Note  Patient Details  Name: [name] [name] MRN: [id number] Date of Birth: 09-Jul-1940  Today's Date: 08/06/2020 SLP Individual Time: 1345-1430 SLP Individual Time Calculation (min): 45 min  Short Term Goals: Week 1: SLP Short Term Goal 1 (Week 1): Pt will increased orientation to time, situation and recent medical events with 100% accuracy min A cues SLP Short Term Goal 2 (Week 1): Pt will complete mildly complex problem solving tasks with 75% accuracy mod A cues SLP Short Term Goal 3 (Week 1): Pt will increase recall of functional and novel information with 70% accuracy provided mod A cues SLP Short Term Goal 4 (Week 1): Pt will increase sustained attention for functional tasks to 10 minutes provided min A cues  Skilled Therapeutic Interventions:   Skilled treatment performed with focus on cognitive goals. SLP facilitated calendar task with patient's personal mobile device to improve organization, problem solving, planning, and error awareness. Patient reports utilizing his phone to schedule appointments versus written calendar. Spouse reports patient required no assistance at baseline. Task involved adding new appointment by 1. Selecting date, 2. Adding title, 3. Selecting appropriate time, 4. Modifying information as needed. Patient required Max verbal/visual cues for all steps and consistent repetition for accuracy. Patient exhibited minimal awareness of errors and was unable to recognize he had difficulty performing task. Patient easily distractible to functions of phone. SLP educated spouse of patient's performance on this date following session. Patient was left in bed with alarm activated and needs within reach. Continue with current plan of care.  Pain Pain Assessment Pain Scale: 0-10 Pain Score: 2  Faces Pain Scale: Hurts a little bit Pain Type: Acute pain Pain Location: Neck Pain Descriptors / Indicators: Discomfort Pain Intervention(s): Cold  applied;Repositioned  Agitated Behavior Scale: TBI Observation Details Observation Environment: patient room Start of observation period - Date: 08/06/20 Start of observation period - Time: 1345 End of observation period - Date: 08/06/20 End of observation period - Time: 1430 Agitated Behavior Scale (DO NOT LEAVE BLANKS) Short attention span, easy distractibility, inability to concentrate: Present to a moderate degree Impulsive, impatient, low tolerance for pain or frustration: Absent Uncooperative, resistant to care, demanding: Absent Violent and/or threatening violence toward people or property: Absent Explosive and/or unpredictable anger: Absent Rocking, rubbing, moaning, or other self-stimulating behavior: Absent Pulling at tubes, restraints, etc.: Absent Wandering from treatment areas: Absent Restlessness, pacing, excessive movement: Absent Repetitive behaviors, motor, and/or verbal: Absent Rapid, loud, or excessive talking: Absent Sudden changes of mood: Absent Easily initiated or excessive crying and/or laughter: Absent Self-abusiveness, physical and/or verbal: Absent Agitated behavior scale total score: 16  Therapy/Group: Individual Therapy  Patty Sermons 08/06/2020, 3:12 PM

## 2020-08-06 NOTE — Progress Notes (Signed)
Occupational Therapy TBI Note  Patient Details  Name: [name] [name] MRN: [id number] Date of Birth: January 24, 1941  Today's Date: 08/06/2020 OT Individual Time: 1430-1530 OT Individual Time Calculation (min): 60 min    Short Term Goals: Week 1:  OT Short Term Goal 1 (Week 1): Pt will complete UB dressing at supervision for two consecutive sessions. OT Short Term Goal 2 (Week 1): Pt will complete LB bathing sit to stand with min guard assist for two sessions. OT Short Term Goal 3 (Week 1): Pt will complete LB dressing sit to stand with min assist. OT Short Term Goal 4 (Week 1): Pt will perform toilet transfers to the elevated toilet with use of the RW or LRAD and min guard assist.  Skilled Therapeutic Interventions/Progress Updates:    1:1. Pt received in bed. Pt completes toileting with MIN A for placement of urinal/mangement of pants d.t urgency. Pt dons shoes with shoe horn with S and increased times with cuing to scoot back as pt very close to EOB. Pt stands at sink for >15 min requring multimodal cuing for termination of flossing and oral care tasks d/t perseverative nature. Wife confirms pt with long oral hygiene habit prior to TBI. Pt completes functional mobility with CGA-MIN A with fatigue in hallway with no AD and VC for longer stride length on R. Pt stands for 3 rounds of cornhole with reaching in B directions across midline ot obtain bean bags in mod ranges outside BOS. Item retrieval from board at standing level with and without reacher with MIN A. Pt returned to room. Exited session with pt seated in bed, exit alarm on and call light in reach   Therapy Documentation Precautions:  Precautions Precautions: Fall,Other (comment) Precaution Comments: posey belt, mild LUE weakness, confusion, R lean in standing, SBP <160 Restrictions Weight Bearing Restrictions: No General:   Vital Signs: Therapy Vitals Temp: 98.6 F (37 C) Temp Source: Oral Pulse Rate: (!) 49 Resp: 17 BP:  114/69 Patient Position (if appropriate): Lying Oxygen Therapy SpO2: 99 % O2 Device: Room Air Pain:   Agitated Behavior Scale: TBI  Observation Details Observation Environment: CIR Start of observation period - Date: 08/06/20 Start of observation period - Time: 1430 End of observation period - Date: 08/06/20 End of observation period - Time: 1530 Agitated Behavior Scale (DO NOT LEAVE BLANKS) Short attention span, easy distractibility, inability to concentrate: Present to a slight degree Impulsive, impatient, low tolerance for pain or frustration: Present to a slight degree Uncooperative, resistant to care, demanding: Absent Violent and/or threatening violence toward people or property: Absent Explosive and/or unpredictable anger: Absent Rocking, rubbing, moaning, or other self-stimulating behavior: Absent Pulling at tubes, restraints, etc.: Absent Wandering from treatment areas: Absent Restlessness, pacing, excessive movement: Absent Repetitive behaviors, motor, and/or verbal: Absent Rapid, loud, or excessive talking: Absent Sudden changes of mood: Absent Easily initiated or excessive crying and/or laughter: Absent Self-abusiveness, physical and/or verbal: Absent Agitated behavior scale total score: 16  ADL: ADL Eating: Set up Where Assessed-Eating: Edge of bed Grooming: Setup Where Assessed-Grooming: Edge of bed Upper Body Bathing: Supervision/safety Where Assessed-Upper Body Bathing: Edge of bed Lower Body Bathing: Minimal assistance Where Assessed-Lower Body Bathing: Edge of bed Upper Body Dressing: Moderate assistance Where Assessed-Upper Body Dressing: Edge of bed Lower Body Dressing: Maximal assistance Where Assessed-Lower Body Dressing: Edge of bed Toileting: Moderate assistance Where Assessed-Toileting: Glass blower/designer: Moderate assistance Toilet Transfer Method: Counselling psychologist: Raised toilet seat Tub/Shower Transfer: Not  assessed Social research officer, government:  Not assessed Vision   Perception    Praxis   Exercises:   Other Treatments:     Therapy/Group: Individual Therapy  Tonny Branch 08/06/2020, 2:44 PM

## 2020-08-07 ENCOUNTER — Inpatient Hospital Stay (HOSPITAL_COMMUNITY): Payer: Medicare Other

## 2020-08-07 ENCOUNTER — Inpatient Hospital Stay (HOSPITAL_COMMUNITY): Payer: Medicare Other | Admitting: Registered Nurse

## 2020-08-07 ENCOUNTER — Other Ambulatory Visit (HOSPITAL_COMMUNITY): Payer: Self-pay | Admitting: Interventional Radiology

## 2020-08-07 ENCOUNTER — Encounter (HOSPITAL_COMMUNITY)
Admission: RE | Disposition: A | Discharge status: Other Acute Inpatient Hospital | Payer: Self-pay | Source: Intra-hospital | Attending: Physical Medicine & Rehabilitation

## 2020-08-07 ENCOUNTER — Encounter (HOSPITAL_COMMUNITY): Payer: Self-pay | Admitting: Anesthesiology

## 2020-08-07 ENCOUNTER — Other Ambulatory Visit (HOSPITAL_COMMUNITY): Payer: BLUE CROSS/BLUE SHIELD

## 2020-08-07 ENCOUNTER — Encounter (HOSPITAL_COMMUNITY)
Admission: AD | Disposition: A | Discharge status: Rehabilitation Facility | Payer: Self-pay | Source: Ambulatory Visit | Attending: Neurology

## 2020-08-07 ENCOUNTER — Inpatient Hospital Stay (HOSPITAL_COMMUNITY)
Admission: AD | Admit: 2020-08-07 | Discharge: 2020-08-15 | DRG: 023 | Disposition: A | Discharge status: Rehabilitation Facility | Payer: Medicare Other | Source: Ambulatory Visit | Attending: Internal Medicine | Admitting: Internal Medicine

## 2020-08-07 DIAGNOSIS — J9600 Acute respiratory failure, unspecified whether with hypoxia or hypercapnia: Secondary | ICD-10-CM

## 2020-08-07 DIAGNOSIS — I69391 Dysphagia following cerebral infarction: Secondary | ICD-10-CM | POA: Diagnosis not present

## 2020-08-07 DIAGNOSIS — S065X3S Traumatic subdural hemorrhage with loss of consciousness of 1 hour to 5 hours 59 minutes, sequela: Secondary | ICD-10-CM | POA: Diagnosis not present

## 2020-08-07 DIAGNOSIS — D72829 Elevated white blood cell count, unspecified: Secondary | ICD-10-CM | POA: Diagnosis not present

## 2020-08-07 DIAGNOSIS — I63311 Cerebral infarction due to thrombosis of right middle cerebral artery: Secondary | ICD-10-CM | POA: Diagnosis not present

## 2020-08-07 DIAGNOSIS — I482 Chronic atrial fibrillation, unspecified: Secondary | ICD-10-CM

## 2020-08-07 DIAGNOSIS — I69354 Hemiplegia and hemiparesis following cerebral infarction affecting left non-dominant side: Secondary | ICD-10-CM | POA: Diagnosis present

## 2020-08-07 DIAGNOSIS — Z978 Presence of other specified devices: Secondary | ICD-10-CM

## 2020-08-07 DIAGNOSIS — I110 Hypertensive heart disease with heart failure: Secondary | ICD-10-CM | POA: Diagnosis not present

## 2020-08-07 DIAGNOSIS — E870 Hyperosmolality and hypernatremia: Secondary | ICD-10-CM | POA: Diagnosis not present

## 2020-08-07 DIAGNOSIS — Z953 Presence of xenogenic heart valve: Secondary | ICD-10-CM | POA: Diagnosis not present

## 2020-08-07 DIAGNOSIS — Z7901 Long term (current) use of anticoagulants: Secondary | ICD-10-CM | POA: Diagnosis not present

## 2020-08-07 DIAGNOSIS — Z833 Family history of diabetes mellitus: Secondary | ICD-10-CM | POA: Diagnosis not present

## 2020-08-07 DIAGNOSIS — G8194 Hemiplegia, unspecified affecting left nondominant side: Secondary | ICD-10-CM | POA: Diagnosis not present

## 2020-08-07 DIAGNOSIS — I69392 Facial weakness following cerebral infarction: Secondary | ICD-10-CM | POA: Diagnosis not present

## 2020-08-07 DIAGNOSIS — Z8249 Family history of ischemic heart disease and other diseases of the circulatory system: Secondary | ICD-10-CM

## 2020-08-07 DIAGNOSIS — R4182 Altered mental status, unspecified: Secondary | ICD-10-CM | POA: Diagnosis not present

## 2020-08-07 DIAGNOSIS — I700 Atherosclerosis of aorta: Secondary | ICD-10-CM | POA: Diagnosis not present

## 2020-08-07 DIAGNOSIS — Z8673 Personal history of transient ischemic attack (TIA), and cerebral infarction without residual deficits: Secondary | ICD-10-CM

## 2020-08-07 DIAGNOSIS — I69318 Other symptoms and signs involving cognitive functions following cerebral infarction: Secondary | ICD-10-CM | POA: Diagnosis not present

## 2020-08-07 DIAGNOSIS — I6601 Occlusion and stenosis of right middle cerebral artery: Secondary | ICD-10-CM | POA: Diagnosis present

## 2020-08-07 DIAGNOSIS — G935 Compression of brain: Secondary | ICD-10-CM | POA: Diagnosis not present

## 2020-08-07 DIAGNOSIS — I4891 Unspecified atrial fibrillation: Secondary | ICD-10-CM | POA: Diagnosis not present

## 2020-08-07 DIAGNOSIS — Z79899 Other long term (current) drug therapy: Secondary | ICD-10-CM | POA: Diagnosis not present

## 2020-08-07 DIAGNOSIS — E876 Hypokalemia: Secondary | ICD-10-CM | POA: Diagnosis present

## 2020-08-07 DIAGNOSIS — R400 Somnolence: Secondary | ICD-10-CM | POA: Diagnosis not present

## 2020-08-07 DIAGNOSIS — S065X9S Traumatic subdural hemorrhage with loss of consciousness of unspecified duration, sequela: Secondary | ICD-10-CM | POA: Diagnosis not present

## 2020-08-07 DIAGNOSIS — S065XAA Traumatic subdural hemorrhage with loss of consciousness status unknown, initial encounter: Secondary | ICD-10-CM | POA: Diagnosis present

## 2020-08-07 DIAGNOSIS — Z781 Physical restraint status: Secondary | ICD-10-CM

## 2020-08-07 DIAGNOSIS — R1310 Dysphagia, unspecified: Secondary | ICD-10-CM | POA: Diagnosis present

## 2020-08-07 DIAGNOSIS — N390 Urinary tract infection, site not specified: Secondary | ICD-10-CM | POA: Diagnosis not present

## 2020-08-07 DIAGNOSIS — I6523 Occlusion and stenosis of bilateral carotid arteries: Secondary | ICD-10-CM | POA: Diagnosis not present

## 2020-08-07 DIAGNOSIS — Z809 Family history of malignant neoplasm, unspecified: Secondary | ICD-10-CM

## 2020-08-07 DIAGNOSIS — I6931 Attention and concentration deficit following cerebral infarction: Secondary | ICD-10-CM | POA: Diagnosis not present

## 2020-08-07 DIAGNOSIS — Z4659 Encounter for fitting and adjustment of other gastrointestinal appliance and device: Secondary | ICD-10-CM

## 2020-08-07 DIAGNOSIS — W1830XD Fall on same level, unspecified, subsequent encounter: Secondary | ICD-10-CM

## 2020-08-07 DIAGNOSIS — E119 Type 2 diabetes mellitus without complications: Secondary | ICD-10-CM | POA: Diagnosis present

## 2020-08-07 DIAGNOSIS — R471 Dysarthria and anarthria: Secondary | ICD-10-CM | POA: Diagnosis present

## 2020-08-07 DIAGNOSIS — I63511 Cerebral infarction due to unspecified occlusion or stenosis of right middle cerebral artery: Secondary | ICD-10-CM

## 2020-08-07 DIAGNOSIS — Z4682 Encounter for fitting and adjustment of non-vascular catheter: Secondary | ICD-10-CM | POA: Diagnosis not present

## 2020-08-07 DIAGNOSIS — I63411 Cerebral infarction due to embolism of right middle cerebral artery: Principal | ICD-10-CM | POA: Diagnosis present

## 2020-08-07 DIAGNOSIS — I4892 Unspecified atrial flutter: Secondary | ICD-10-CM | POA: Diagnosis not present

## 2020-08-07 DIAGNOSIS — Z96641 Presence of right artificial hip joint: Secondary | ICD-10-CM | POA: Diagnosis present

## 2020-08-07 DIAGNOSIS — R29810 Facial weakness: Secondary | ICD-10-CM | POA: Diagnosis present

## 2020-08-07 DIAGNOSIS — I6389 Other cerebral infarction: Secondary | ICD-10-CM | POA: Diagnosis not present

## 2020-08-07 DIAGNOSIS — I63031 Cerebral infarction due to thrombosis of right carotid artery: Secondary | ICD-10-CM | POA: Diagnosis present

## 2020-08-07 DIAGNOSIS — B961 Klebsiella pneumoniae [K. pneumoniae] as the cause of diseases classified elsewhere: Secondary | ICD-10-CM | POA: Diagnosis not present

## 2020-08-07 DIAGNOSIS — S065X9A Traumatic subdural hemorrhage with loss of consciousness of unspecified duration, initial encounter: Secondary | ICD-10-CM

## 2020-08-07 DIAGNOSIS — J9601 Acute respiratory failure with hypoxia: Secondary | ICD-10-CM | POA: Diagnosis not present

## 2020-08-07 DIAGNOSIS — I480 Paroxysmal atrial fibrillation: Secondary | ICD-10-CM | POA: Diagnosis present

## 2020-08-07 DIAGNOSIS — Z952 Presence of prosthetic heart valve: Secondary | ICD-10-CM | POA: Diagnosis not present

## 2020-08-07 DIAGNOSIS — I6521 Occlusion and stenosis of right carotid artery: Secondary | ICD-10-CM | POA: Diagnosis present

## 2020-08-07 DIAGNOSIS — I5032 Chronic diastolic (congestive) heart failure: Secondary | ICD-10-CM | POA: Diagnosis not present

## 2020-08-07 DIAGNOSIS — B965 Pseudomonas (aeruginosa) (mallei) (pseudomallei) as the cause of diseases classified elsewhere: Secondary | ICD-10-CM | POA: Diagnosis not present

## 2020-08-07 DIAGNOSIS — I10 Essential (primary) hypertension: Secondary | ICD-10-CM | POA: Diagnosis not present

## 2020-08-07 DIAGNOSIS — R531 Weakness: Secondary | ICD-10-CM | POA: Diagnosis not present

## 2020-08-07 DIAGNOSIS — R29720 NIHSS score 20: Secondary | ICD-10-CM | POA: Diagnosis present

## 2020-08-07 DIAGNOSIS — D62 Acute posthemorrhagic anemia: Secondary | ICD-10-CM | POA: Diagnosis not present

## 2020-08-07 DIAGNOSIS — I712 Thoracic aortic aneurysm, without rupture: Secondary | ICD-10-CM | POA: Diagnosis not present

## 2020-08-07 DIAGNOSIS — I639 Cerebral infarction, unspecified: Secondary | ICD-10-CM | POA: Diagnosis present

## 2020-08-07 DIAGNOSIS — R4701 Aphasia: Secondary | ICD-10-CM | POA: Diagnosis present

## 2020-08-07 DIAGNOSIS — Z87820 Personal history of traumatic brain injury: Secondary | ICD-10-CM | POA: Diagnosis not present

## 2020-08-07 DIAGNOSIS — R414 Neurologic neglect syndrome: Secondary | ICD-10-CM | POA: Diagnosis not present

## 2020-08-07 DIAGNOSIS — J0100 Acute maxillary sinusitis, unspecified: Secondary | ICD-10-CM | POA: Diagnosis not present

## 2020-08-07 DIAGNOSIS — Z20822 Contact with and (suspected) exposure to covid-19: Secondary | ICD-10-CM | POA: Diagnosis present

## 2020-08-07 DIAGNOSIS — R32 Unspecified urinary incontinence: Secondary | ICD-10-CM | POA: Diagnosis present

## 2020-08-07 DIAGNOSIS — E785 Hyperlipidemia, unspecified: Secondary | ICD-10-CM | POA: Diagnosis present

## 2020-08-07 DIAGNOSIS — D72828 Other elevated white blood cell count: Secondary | ICD-10-CM | POA: Diagnosis present

## 2020-08-07 DIAGNOSIS — I6200 Nontraumatic subdural hemorrhage, unspecified: Secondary | ICD-10-CM | POA: Diagnosis not present

## 2020-08-07 DIAGNOSIS — I484 Atypical atrial flutter: Secondary | ICD-10-CM | POA: Diagnosis not present

## 2020-08-07 DIAGNOSIS — J3489 Other specified disorders of nose and nasal sinuses: Secondary | ICD-10-CM | POA: Diagnosis not present

## 2020-08-07 DIAGNOSIS — G9389 Other specified disorders of brain: Secondary | ICD-10-CM | POA: Diagnosis not present

## 2020-08-07 DIAGNOSIS — I619 Nontraumatic intracerebral hemorrhage, unspecified: Secondary | ICD-10-CM | POA: Diagnosis not present

## 2020-08-07 DIAGNOSIS — N400 Enlarged prostate without lower urinary tract symptoms: Secondary | ICD-10-CM | POA: Diagnosis present

## 2020-08-07 DIAGNOSIS — S0003XA Contusion of scalp, initial encounter: Secondary | ICD-10-CM | POA: Diagnosis not present

## 2020-08-07 DIAGNOSIS — Z9181 History of falling: Secondary | ICD-10-CM | POA: Diagnosis not present

## 2020-08-07 DIAGNOSIS — S065X9D Traumatic subdural hemorrhage with loss of consciousness of unspecified duration, subsequent encounter: Secondary | ICD-10-CM

## 2020-08-07 DIAGNOSIS — R569 Unspecified convulsions: Secondary | ICD-10-CM | POA: Diagnosis not present

## 2020-08-07 DIAGNOSIS — G936 Cerebral edema: Secondary | ICD-10-CM | POA: Diagnosis present

## 2020-08-07 DIAGNOSIS — R918 Other nonspecific abnormal finding of lung field: Secondary | ICD-10-CM | POA: Diagnosis not present

## 2020-08-07 HISTORY — PX: IR ANGIO VERTEBRAL SEL SUBCLAVIAN INNOMINATE UNI R MOD SED: IMG5365

## 2020-08-07 HISTORY — PX: IR CT HEAD LTD: IMG2386

## 2020-08-07 HISTORY — PX: IR PERCUTANEOUS ART THROMBECTOMY/INFUSION INTRACRANIAL INC DIAG ANGIO: IMG6087

## 2020-08-07 HISTORY — PX: RADIOLOGY WITH ANESTHESIA: SHX6223

## 2020-08-07 LAB — CBC WITH DIFFERENTIAL/PLATELET
Abs Immature Granulocytes: 0.18 10*3/uL — ABNORMAL HIGH (ref 0.00–0.07)
Basophils Absolute: 0 10*3/uL (ref 0.0–0.1)
Basophils Relative: 0 %
Eosinophils Absolute: 0 10*3/uL (ref 0.0–0.5)
Eosinophils Relative: 0 %
HCT: 38.3 % — ABNORMAL LOW (ref 39.0–52.0)
Hemoglobin: 13.1 g/dL (ref 13.0–17.0)
Immature Granulocytes: 1 %
Lymphocytes Relative: 9 %
Lymphs Abs: 1.6 10*3/uL (ref 0.7–4.0)
MCH: 31.5 pg (ref 26.0–34.0)
MCHC: 34.2 g/dL (ref 30.0–36.0)
MCV: 92.1 fL (ref 80.0–100.0)
Monocytes Absolute: 1.4 10*3/uL — ABNORMAL HIGH (ref 0.1–1.0)
Monocytes Relative: 8 %
Neutro Abs: 14.4 10*3/uL — ABNORMAL HIGH (ref 1.7–7.7)
Neutrophils Relative %: 82 %
Platelets: 311 10*3/uL (ref 150–400)
RBC: 4.16 MIL/uL — ABNORMAL LOW (ref 4.22–5.81)
RDW: 13.9 % (ref 11.5–15.5)
WBC: 17.7 10*3/uL — ABNORMAL HIGH (ref 4.0–10.5)
nRBC: 0 % (ref 0.0–0.2)

## 2020-08-07 LAB — BASIC METABOLIC PANEL
Anion gap: 6 (ref 5–15)
BUN: 29 mg/dL — ABNORMAL HIGH (ref 8–23)
CO2: 24 mmol/L (ref 22–32)
Calcium: 8.8 mg/dL — ABNORMAL LOW (ref 8.9–10.3)
Chloride: 104 mmol/L (ref 98–111)
Creatinine, Ser: 0.92 mg/dL (ref 0.61–1.24)
GFR, Estimated: >60 mL/min (ref >60)
Glucose, Bld: 116 mg/dL — ABNORMAL HIGH (ref 70–99)
Potassium: 4.1 mmol/L (ref 3.5–5.1)
Sodium: 134 mmol/L — ABNORMAL LOW (ref 135–145)

## 2020-08-07 LAB — POCT I-STAT 7, (LYTES, BLD GAS, ICA,H+H)
Acid-Base Excess: 2 mmol/L (ref 0.0–2.0)
Bicarbonate: 27.1 mmol/L (ref 20.0–28.0)
Calcium, Ion: 1.18 mmol/L (ref 1.15–1.40)
HCT: 36 % — ABNORMAL LOW (ref 39.0–52.0)
Hemoglobin: 12.2 g/dL — ABNORMAL LOW (ref 13.0–17.0)
O2 Saturation: 100 %
Patient temperature: 97.5
Potassium: 4 mmol/L (ref 3.5–5.1)
Sodium: 136 mmol/L (ref 135–145)
TCO2: 28 mmol/L (ref 22–32)
pCO2 arterial: 41.7 mmHg (ref 32.0–48.0)
pH, Arterial: 7.419 (ref 7.350–7.450)
pO2, Arterial: 560 mmHg — ABNORMAL HIGH (ref 83.0–108.0)

## 2020-08-07 LAB — ECHOCARDIOGRAM COMPLETE
AR max vel: 1.83 cm2
AV Area VTI: 1.67 cm2
AV Area mean vel: 1.8 cm2
AV Mean grad: 12 mmHg
AV Peak grad: 21 mmHg
Ao pk vel: 2.29 m/s
MV VTI: 1.64 cm2
S' Lateral: 2.7 cm

## 2020-08-07 LAB — GLUCOSE, CAPILLARY
Glucose-Capillary: 146 mg/dL — ABNORMAL HIGH (ref 70–99)
Glucose-Capillary: 112 mg/dL — ABNORMAL HIGH (ref 70–99)
Glucose-Capillary: 125 mg/dL — ABNORMAL HIGH (ref 70–99)
Glucose-Capillary: 110 mg/dL — ABNORMAL HIGH (ref 70–99)

## 2020-08-07 LAB — PROCALCITONIN: Procalcitonin: <0.1 ng/mL

## 2020-08-07 SURGERY — IR WITH ANESTHESIA
Anesthesia: General

## 2020-08-07 MED ORDER — ACETAMINOPHEN 650 MG RE SUPP: 650.0000 mg | RECTAL | Status: DC | PRN

## 2020-08-07 MED ORDER — STROKE: EARLY STAGES OF RECOVERY BOOK
Freq: Once | Status: AC
  Filled 2020-08-07: qty 1

## 2020-08-07 MED ORDER — DOFETILIDE 500 MCG PO CAPS
500.0000 ug | ORAL_CAPSULE | Freq: Two times a day (BID) | ORAL | Status: DC
  Administered 2020-08-07 – 2020-08-15 (×16): 500 ug
  Filled 2020-08-07 (×19): qty 1

## 2020-08-07 MED ORDER — VERAPAMIL HCL 2.5 MG/ML IV SOLN
INTRAVENOUS | Status: AC
  Filled 2020-08-07: qty 2

## 2020-08-07 MED ORDER — ACETAMINOPHEN 160 MG/5ML PO SOLN: 650.0000 mg | ORAL | Status: DC | PRN

## 2020-08-07 MED ORDER — NITROGLYCERIN 1 MG/10 ML FOR IR/CATH LAB
INTRA_ARTERIAL | Status: DC | PRN
  Administered 2020-08-07: 25 ug via INTRA_ARTERIAL

## 2020-08-07 MED ORDER — PHENYLEPHRINE HCL-NACL 10-0.9 MG/250ML-% IV SOLN
INTRAVENOUS | Status: DC | PRN
  Administered 2020-08-07: 35 ug/min via INTRAVENOUS

## 2020-08-07 MED ORDER — ASPIRIN 81 MG PO CHEW
CHEWABLE_TABLET | ORAL | Status: AC
  Filled 2020-08-07: qty 1

## 2020-08-07 MED ORDER — IOHEXOL 300 MG/ML  SOLN: 50.0000 mL | Freq: Once | INTRAMUSCULAR | Status: DC | PRN

## 2020-08-07 MED ORDER — FENTANYL CITRATE (PF) 100 MCG/2ML IJ SOLN
25.0000 ug | INTRAMUSCULAR | Status: DC | PRN
Start: 2020-08-07 — End: 2020-08-15
  Administered 2020-08-08 – 2020-08-11 (×2): 50 ug via INTRAVENOUS
  Filled 2020-08-07 (×3): qty 2

## 2020-08-07 MED ORDER — CEFAZOLIN SODIUM-DEXTROSE 2-4 GM/100ML-% IV SOLN
INTRAVENOUS | Status: AC
  Filled 2020-08-07: qty 100

## 2020-08-07 MED ORDER — SENNOSIDES-DOCUSATE SODIUM 8.6-50 MG PO TABS
1.0000 | ORAL_TABLET | Freq: Every evening | ORAL | Status: DC | PRN
  Administered 2020-08-13: 1

## 2020-08-07 MED ORDER — DOXAZOSIN MESYLATE 2 MG PO TABS
2.0000 mg | ORAL_TABLET | Freq: Every day | ORAL | Status: DC
  Administered 2020-08-08 – 2020-08-15 (×8): 2 mg
  Filled 2020-08-07 (×8): qty 1

## 2020-08-07 MED ORDER — TICAGRELOR 90 MG PO TABS
ORAL_TABLET | ORAL | Status: AC
  Filled 2020-08-07: qty 2

## 2020-08-07 MED ORDER — FENTANYL CITRATE (PF) 100 MCG/2ML IJ SOLN
INTRAMUSCULAR | Status: AC
  Filled 2020-08-07: qty 2

## 2020-08-07 MED ORDER — NITROGLYCERIN 1 MG/10 ML FOR IR/CATH LAB
INTRA_ARTERIAL | Status: AC
  Filled 2020-08-07: qty 10

## 2020-08-07 MED ORDER — PHENYLEPHRINE 40 MCG/ML (10ML) SYRINGE FOR IV PUSH (FOR BLOOD PRESSURE SUPPORT)
PREFILLED_SYRINGE | INTRAVENOUS | Status: DC | PRN
  Administered 2020-08-07: 40 ug via INTRAVENOUS

## 2020-08-07 MED ORDER — LACTATED RINGERS IV SOLN: INTRAVENOUS | Status: DC | PRN

## 2020-08-07 MED ORDER — FINASTERIDE 5 MG PO TABS: 5.0000 mg | ORAL_TABLET | Freq: Every day | ORAL | Status: DC

## 2020-08-07 MED ORDER — FENTANYL CITRATE (PF) 100 MCG/2ML IJ SOLN: 25.0000 ug | INTRAMUSCULAR | Status: DC | PRN

## 2020-08-07 MED ORDER — EPHEDRINE SULFATE-NACL 50-0.9 MG/10ML-% IV SOSY
PREFILLED_SYRINGE | INTRAVENOUS | Status: DC | PRN
  Administered 2020-08-07 (×2): 10 mg via INTRAVENOUS

## 2020-08-07 MED ORDER — INSULIN ASPART 100 UNIT/ML IJ SOLN
0.0000 [IU] | INTRAMUSCULAR | Status: DC
  Administered 2020-08-08: 1 [IU] via SUBCUTANEOUS
  Administered 2020-08-08 – 2020-08-09 (×2): 2 [IU] via SUBCUTANEOUS
  Administered 2020-08-09: 1 [IU] via SUBCUTANEOUS
  Administered 2020-08-09: 2 [IU] via SUBCUTANEOUS
  Administered 2020-08-09 – 2020-08-10 (×3): 1 [IU] via SUBCUTANEOUS
  Administered 2020-08-10 (×2): 2 [IU] via SUBCUTANEOUS
  Administered 2020-08-10 – 2020-08-11 (×6): 1 [IU] via SUBCUTANEOUS
  Administered 2020-08-11 – 2020-08-12 (×3): 2 [IU] via SUBCUTANEOUS
  Administered 2020-08-12: 1 [IU] via SUBCUTANEOUS
  Administered 2020-08-12: 2 [IU] via SUBCUTANEOUS
  Administered 2020-08-12: 1 [IU] via SUBCUTANEOUS
  Administered 2020-08-12 – 2020-08-13 (×4): 2 [IU] via SUBCUTANEOUS
  Administered 2020-08-13 (×3): 1 [IU] via SUBCUTANEOUS
  Administered 2020-08-13 – 2020-08-14 (×2): 2 [IU] via SUBCUTANEOUS
  Administered 2020-08-14 (×3): 1 [IU] via SUBCUTANEOUS
  Administered 2020-08-14 – 2020-08-15 (×4): 2 [IU] via SUBCUTANEOUS
  Administered 2020-08-15: 1 [IU] via SUBCUTANEOUS

## 2020-08-07 MED ORDER — IOHEXOL 350 MG/ML SOLN
100.0000 mL | Freq: Once | INTRAVENOUS | Status: AC | PRN
  Administered 2020-08-07: 100 mL via INTRAVENOUS

## 2020-08-07 MED ORDER — CANGRELOR TETRASODIUM 50 MG IV SOLR
INTRAVENOUS | Status: AC
  Filled 2020-08-07: qty 50

## 2020-08-07 MED ORDER — PERFLUTREN LIPID MICROSPHERE
1.0000 mL | INTRAVENOUS | Status: AC | PRN
  Administered 2020-08-07: 3 mL via INTRAVENOUS
  Filled 2020-08-07: qty 10

## 2020-08-07 MED ORDER — PROPOFOL 10 MG/ML IV BOLUS
INTRAVENOUS | Status: DC | PRN
  Administered 2020-08-07: 140 mg via INTRAVENOUS

## 2020-08-07 MED ORDER — CEFAZOLIN SODIUM-DEXTROSE 2-3 GM-%(50ML) IV SOLR
INTRAVENOUS | Status: DC | PRN
  Administered 2020-08-07: 2 g via INTRAVENOUS

## 2020-08-07 MED ORDER — TAMSULOSIN HCL 0.4 MG PO CAPS: 0.4000 mg | ORAL_CAPSULE | Freq: Two times a day (BID) | ORAL | Status: DC

## 2020-08-07 MED ORDER — ATORVASTATIN CALCIUM 80 MG PO TABS
80.0000 mg | ORAL_TABLET | Freq: Every evening | ORAL | Status: DC
  Administered 2020-08-07 – 2020-08-14 (×8): 80 mg
  Filled 2020-08-07 (×2): qty 1
  Filled 2020-08-07: qty 2
  Filled 2020-08-07 (×5): qty 1

## 2020-08-07 MED ORDER — VITAMIN B-12 1000 MCG PO TABS
5000.0000 ug | ORAL_TABLET | Freq: Every day | ORAL | Status: DC
  Administered 2020-08-08 – 2020-08-12 (×5): 5000 ug
  Filled 2020-08-07 (×5): qty 5

## 2020-08-07 MED ORDER — ACETAMINOPHEN 325 MG PO TABS: 650.0000 mg | ORAL_TABLET | ORAL | Status: DC | PRN

## 2020-08-07 MED ORDER — SODIUM CHLORIDE 0.9 % IV SOLN: INTRAVENOUS | Status: DC

## 2020-08-07 MED ORDER — PROPOFOL 1000 MG/100ML IV EMUL
0.0000 ug/kg/min | INTRAVENOUS | Status: DC
  Administered 2020-08-07: 30 ug/kg/min via INTRAVENOUS
  Administered 2020-08-07: 25 ug/kg/min via INTRAVENOUS
  Administered 2020-08-08 (×2): 15 ug/kg/min via INTRAVENOUS
  Administered 2020-08-08: 10 ug/kg/min via INTRAVENOUS
  Filled 2020-08-07 (×7): qty 100

## 2020-08-07 MED ORDER — ASPIRIN 325 MG PO TABS
325.0000 mg | ORAL_TABLET | Freq: Every day | ORAL | Status: DC
  Administered 2020-08-07 – 2020-08-15 (×9): 325 mg
  Filled 2020-08-07 (×8): qty 1

## 2020-08-07 MED ORDER — METOPROLOL TARTRATE 25 MG/10 ML ORAL SUSPENSION
12.5000 mg | Freq: Four times a day (QID) | ORAL | Status: DC
  Administered 2020-08-07 – 2020-08-15 (×32): 12.5 mg
  Filled 2020-08-07 (×3): qty 10
  Filled 2020-08-07: qty 5
  Filled 2020-08-07: qty 10
  Filled 2020-08-07 (×2): qty 5
  Filled 2020-08-07 (×4): qty 10
  Filled 2020-08-07 (×2): qty 5
  Filled 2020-08-07 (×7): qty 10
  Filled 2020-08-07: qty 5
  Filled 2020-08-07 (×4): qty 10
  Filled 2020-08-07: qty 5
  Filled 2020-08-07: qty 10
  Filled 2020-08-07: qty 5
  Filled 2020-08-07 (×3): qty 10

## 2020-08-07 MED ORDER — VITAMIN B-12 5000 MCG SL SUBL: 5000.0000 ug | SUBLINGUAL_TABLET | Freq: Every day | SUBLINGUAL | Status: DC

## 2020-08-07 MED ORDER — PANTOPRAZOLE SODIUM 40 MG IV SOLR
40.0000 mg | Freq: Every day | INTRAVENOUS | Status: DC
  Administered 2020-08-08: 40 mg via INTRAVENOUS
  Filled 2020-08-07 (×2): qty 40

## 2020-08-07 MED ORDER — SODIUM CHLORIDE 0.9 % IV SOLN
3.0000 g | Freq: Four times a day (QID) | INTRAVENOUS | Status: AC
  Administered 2020-08-07 – 2020-08-10 (×12): 3 g via INTRAVENOUS
  Filled 2020-08-07 (×6): qty 3
  Filled 2020-08-07: qty 8
  Filled 2020-08-07: qty 3
  Filled 2020-08-07: qty 8
  Filled 2020-08-07 (×2): qty 3
  Filled 2020-08-07: qty 8

## 2020-08-07 MED ORDER — IOHEXOL 240 MG/ML SOLN
INTRAMUSCULAR | Status: AC
  Filled 2020-08-07: qty 200

## 2020-08-07 MED ORDER — POLYETHYLENE GLYCOL 3350 17 G PO PACK
17.0000 g | PACK | Freq: Every day | ORAL | Status: DC
  Administered 2020-08-08 – 2020-08-13 (×6): 17 g
  Filled 2020-08-07 (×7): qty 1

## 2020-08-07 MED ORDER — DOCUSATE SODIUM 50 MG/5ML PO LIQD
100.0000 mg | Freq: Two times a day (BID) | ORAL | Status: DC
  Administered 2020-08-07 – 2020-08-10 (×7): 100 mg
  Filled 2020-08-07 (×7): qty 10

## 2020-08-07 MED ORDER — EPTIFIBATIDE 20 MG/10ML IV SOLN
INTRAVENOUS | Status: AC
  Filled 2020-08-07: qty 10

## 2020-08-07 MED ORDER — SUCCINYLCHOLINE CHLORIDE 200 MG/10ML IV SOSY
PREFILLED_SYRINGE | INTRAVENOUS | Status: DC | PRN
  Administered 2020-08-07: 130 mg via INTRAVENOUS

## 2020-08-07 MED ORDER — CLEVIDIPINE BUTYRATE 0.5 MG/ML IV EMUL
0.0000 mg/h | INTRAVENOUS | Status: AC
  Filled 2020-08-07: qty 50

## 2020-08-07 MED ORDER — LACTATED RINGERS IV SOLN: INTRAVENOUS | Status: DC

## 2020-08-07 MED ORDER — CHLORHEXIDINE GLUCONATE 0.12% ORAL RINSE (MEDLINE KIT)
15.0000 mL | Freq: Two times a day (BID) | OROMUCOSAL | Status: DC
  Administered 2020-08-07 – 2020-08-15 (×17): 15 mL via OROMUCOSAL

## 2020-08-07 MED ORDER — ASPIRIN 325 MG PO TABS
325.0000 mg | ORAL_TABLET | Freq: Every day | ORAL | Status: DC
  Filled 2020-08-07: qty 1

## 2020-08-07 MED ORDER — CLOPIDOGREL BISULFATE 300 MG PO TABS
ORAL_TABLET | ORAL | Status: AC
  Filled 2020-08-07: qty 1

## 2020-08-07 MED ORDER — ONDANSETRON HCL 4 MG/2ML IJ SOLN
INTRAMUSCULAR | Status: DC | PRN
  Administered 2020-08-07: 4 mg via INTRAVENOUS

## 2020-08-07 MED ORDER — ORAL CARE MOUTH RINSE
15.0000 mL | OROMUCOSAL | Status: DC
  Administered 2020-08-07 – 2020-08-12 (×52): 15 mL via OROMUCOSAL

## 2020-08-07 MED ORDER — PROPOFOL 500 MG/50ML IV EMUL
INTRAVENOUS | Status: DC | PRN
  Administered 2020-08-07: 50 ug/kg/min via INTRAVENOUS

## 2020-08-07 MED ORDER — IOHEXOL 240 MG/ML SOLN
150.0000 mL | Freq: Once | INTRAMUSCULAR | Status: AC | PRN
  Administered 2020-08-07: 88 mL via INTRAVENOUS

## 2020-08-07 MED ORDER — ROCURONIUM BROMIDE 10 MG/ML (PF) SYRINGE
PREFILLED_SYRINGE | INTRAVENOUS | Status: DC | PRN
  Administered 2020-08-07: 20 mg via INTRAVENOUS
  Administered 2020-08-07: 50 mg via INTRAVENOUS

## 2020-08-07 MED ORDER — TIROFIBAN HCL IN NACL 5-0.9 MG/100ML-% IV SOLN
INTRAVENOUS | Status: AC
  Filled 2020-08-07: qty 100

## 2020-08-07 NOTE — Progress Notes (Signed)
Occupational Therapy Session Note  Patient Details  Name: [name] [name] MRN: [id number] Date of Birth: [birth year]  Today's Date: 08/07/2020 OT Individual Time: 7591-6384 OT Individual Time Calculation (min): 25 min    Short Term Goals: Week 1:  OT Short Term Goal 1 (Week 1): Pt will complete UB dressing at supervision for two consecutive sessions. OT Short Term Goal 2 (Week 1): Pt will complete LB bathing sit to stand with min guard assist for two sessions. OT Short Term Goal 3 (Week 1): Pt will complete LB dressing sit to stand with min assist. OT Short Term Goal 4 (Week 1): Pt will perform toilet transfers to the elevated toilet with use of the RW or LRAD and min guard assist.  Skilled Therapeutic Interventions/Progress Updates:    1:1. Pt received in bed with wife at bedside. Pt with very poor arousal this AM and while RN delivers medication OT gathers supplies for shower. Despite attempts to allow pt to arouse using cool wash cloth and lights. Pt unresponsive to noxious stimuli on L and does not move L extremoties. Pt vitals WNL. and requires total A to assume EOB with immediate L LOB. Pt returned to supine and scooted to [facility]. Pt RN, PA and MD alerted as concern for CVA. Exited session with pt seated in bed, exit alarm on and call light in reach.  Later CT confirmed CVA and pt to return to acute care.    Therapy Documentation Precautions:  Precautions Precautions: Fall,Other (comment) Precaution Comments: posey belt, mild LUE weakness, confusion, R lean in standing, SBP <160 Restrictions Weight Bearing Restrictions: No General: General OT Amount of Missed Time: 30 Minutes Vital Signs: Therapy Vitals Pulse Rate: 71 BP: 127/80 Oxygen Therapy SpO2: 97 % Pain:   ADL: ADL Eating: Set up Where Assessed-Eating: Edge of bed Grooming: Setup Where Assessed-Grooming: Edge of bed Upper Body Bathing: Supervision/safety Where Assessed-Upper Body Bathing: Edge of bed Lower Body  Bathing: Minimal assistance Where Assessed-Lower Body Bathing: Edge of bed Upper Body Dressing: Moderate assistance Where Assessed-Upper Body Dressing: Edge of bed Lower Body Dressing: Maximal assistance Where Assessed-Lower Body Dressing: Edge of bed Toileting: Moderate assistance Where Assessed-Toileting: Glass blower/designer: Moderate assistance Toilet Transfer Method: Counselling psychologist: Raised toilet seat Tub/Shower Transfer: Not assessed Social research officer, government: Not assessed Vision   Perception    Praxis   Exercises:   Other Treatments:     Therapy/Group: Individual Therapy  Tonny Branch 08/07/2020, 10:39 AM

## 2020-08-07 NOTE — Progress Notes (Signed)
Speech Language Pathology Weekly Progress Note  Patient Details  Name: [name] [name] MRN: [id number] Date of Birth: 06-21-1940  Beginning of progress report period: Aug 01, 2020 End of progress report period: August 07, 2020  Short Term Goals: Week 1: SLP Short Term Goal 1 (Week 1): Pt will increased orientation to time, situation and recent medical events with 100% accuracy min A cues SLP Short Term Goal 1 - Progress (Week 1): Met SLP Short Term Goal 2 (Week 1): Pt will complete mildly complex problem solving tasks with 75% accuracy mod A cues SLP Short Term Goal 2 - Progress (Week 1): Not met SLP Short Term Goal 3 (Week 1): Pt will increase recall of functional and novel information with 70% accuracy provided mod A cues SLP Short Term Goal 3 - Progress (Week 1): Met SLP Short Term Goal 4 (Week 1): Pt will increase sustained attention for functional tasks to 10 minutes provided min A cues SLP Short Term Goal 4 - Progress (Week 1): Met    New Short Term Goals: Week 2: SLP Short Term Goal 1 (Week 2): STGs=LTGs due to ELOS  Weekly Progress Updates: Patient has made functional gains and has met 3 of 4 STGs this reporting period. Currently, patient demonstrates behaviors consistent with a Rancho Level VI-VII and requires overall Mod A multimodal cues to complete functional and familiar tasks safely in regards to problem solving, attention, recall and orientation. However, Max-Total A multimodal cues are needed for complex problem solving and awareness of cognitive deficits. Patient and family education ongoing. Patient would benefit from continued skilled SLP intervention to maximize their cognitive functioning and overall functional independence prior to discharge.      Intensity: Minumum of 1-2 x/day, 30 to 90 minutes Frequency: 3 to 5 out of 7 days Duration/Length of Stay: 6/10 Treatment/Interventions: Cognitive remediation/compensation;Therapeutic Activities;Therapeutic  Exercise;Internal/external aids;Cueing hierarchy;Functional tasks;Patient/family education;Environmental controls    Anderson, [name] 08/07/2020, 6:32 AM

## 2020-08-07 NOTE — Progress Notes (Signed)
Patient ID: [name] [name], male   DOB: 07-07-1940, 80 y.o.   MRN: [id number] RT M LSW ? 8 PM LN. New onset decreased LOC with Lt sided weakness and Lt sided neglect. CT brain No ICH ASPECTS  ? CTA occluded  Rt  ICA to the terminus .and RT MCA M2 branch. CTP . Core of 45 ml versus Tma>6s 162 ml Mismatch of 117 ml .. . Endovascular treatment D/W spouse and son. Reasons,procedure and alternatives discussed. Risks of ICH of 10 % ,worsening neuro deficit death and inability to revascularize reviewed. They both expressed understanding and provided consent to proceed with the treatment. S.Zareena Willis MD

## 2020-08-07 NOTE — Discharge Summary (Signed)
Physician Discharge Summary  Patient ID: [name] [name] MRN: [id number] DOB/AGE: 03/08/1940 80 y.o.  Admit date: 07/31/2020 Discharge date: 08/07/2020  Discharge Diagnoses:  Principal Problem:   Subdural hemorrhage following injury [facility]) Active Problems:   Atrial flutter ([place])   Benign prostatic hyperplasia with urinary obstruction   Prediabetes   Middle cerebral artery embolism, right   Leucocytosis   Discharged Condition: Critical   Significant Diagnostic Studies: CT HEAD WO CONTRAST  Addendum Date: 08/07/2020   ADDENDUM REPORT: 08/07/2020 09:36 ADDENDUM: These results were called by telephone at the time of interpretation on 08/07/2020 at 9:36 am to provider Dr. Tessa Lerner, who verbally acknowledged these results. Electronically Signed   By: Kellie Simmering DO   On: 08/07/2020 09:36   Result Date: 08/07/2020 CLINICAL DATA:  Mental status change, unknown cause. Additional history provided: Patient nonverbal and not moving left side, last known normal 8 p.m. last night. EXAM: CT HEAD WITHOUT CONTRAST TECHNIQUE: Contiguous axial images were obtained from the base of the skull through the vertex without intravenous contrast. COMPARISON:  Prior head CT examinations 08/03/2020 and earlier. FINDINGS: Brain: New from the prior examination of 08/03/2020, there is a large acute cortical/subcortical infarct within the right middle cerebral artery vascular territory, affecting the right frontal, parietal and temporal lobes as well as right insula and lateral right occipital lobe. Unchanged subdural hemorrhage overlying the right cerebral hemisphere. Continued interval decrease in conspicuity of interhemispheric subdural and subarachnoid hemorrhage. Unchanged size of an extra-axial hemorrhage overlying the left parietal lobe, again measuring up to 10 mm in thickness (remeasured on prior). Persistent trace intraventricular hemorrhage. No evidence of hydrocephalus at this time. Unchanged mass effect with 4 mm  leftward midline shift measured at the level of the septum pellucidum. Redemonstrated chronic left parietal temporal infarct with ex vacuo dilatation of the posterior left lateral ventricle. Persistent although decreased pneumocephalus overlying the right cerebral hemisphere. Vascular: Asymmetric hyperdensity of the M1 and M2 right middle cerebral artery vessels, likely reflecting endoluminal thrombus. Atherosclerotic calcifications. Skull: Right-sided cranioplasty with overlying scalp staples. Sinuses/Orbits: Visualized orbits show no acute finding. Frothy secretions and moderate mucosal thickening within the right maxillary sinus. Trace bilateral ethmoid sinus mucosal thickening. IMPRESSION: 1. 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 Asymmetric hyperdensity of the M1 and M2 right middle cerebral artery vessels, likely reflecting endoluminal thrombus. 2. Unchanged subdural hemorrhage overlying the right cerebral hemisphere. 3. Continued interval decrease in conspicuity of interhemispheric subdural and subarachnoid hemorrhage. 4. Unchanged size of an extra-axial hemorrhage overlying the left parietal lobe, again measuring up to 10 mm in thickness. 5. Persistent trace intraventricular hemorrhage. No evidence of hydrocephalus. 6. Unchanged mass effect with 4 mm leftward midline shift. 7. Right maxillary sinusitis. Electronically Signed: By: Kellie Simmering DO On: 08/07/2020 09:32    CT ANGIO HEAD NECK W WO CM W PERF (CODE STROKE)  Addendum Date: 08/07/2020   ADDENDUM REPORT: 08/07/2020 10:13 ADDENDUM: These results were called by telephone at the time of interpretation on 08/07/2020 at 10:05 am to provider [facility] , who verbally acknowledged these results. Electronically Signed   By: Kellie Simmering DO   On: 08/07/2020 10:13   Result Date: 08/07/2020 CLINICAL DATA:  Mental status change, unknown cause. EXAM: CT  ANGIOGRAPHY HEAD AND NECK CT PERFUSION BRAIN TECHNIQUE: Multidetector CT imaging of the head and neck was performed using the standard protocol during bolus administration of intravenous contrast. Multiplanar CT image  reconstructions and MIPs were obtained to evaluate the vascular anatomy. Carotid stenosis measurements (when applicable) are obtained utilizing NASCET criteria, using the distal internal carotid diameter as the denominator. Multiphase CT imaging of the brain was performed following IV bolus contrast injection. Subsequent parametric perfusion maps were calculated using RAPID software. CONTRAST:  130mL OMNIPAQUE IOHEXOL 350 MG/ML SOLN COMPARISON:  Noncontrast head CT examinations 08/07/2020 and earlier. MRA neck 08/06/2013, MRA head 08/06/2013. FINDINGS: CTA NECK FINDINGS Aortic arch: The visualized ascending thoracic aorta measures up to 4 mm in diameter. Standard aortic branching. Atherosclerotic plaque within the visualized aortic arch and proximal major branch vessels of the neck. No hemodynamically significant innominate or proximal subclavian artery stenosis. Right carotid system: The CCA is patent to the bifurcation. Mild to moderate calcified plaque within the distal CCA and carotid bifurcation. The ICA becomes occluded shortly beyond its origin and remains occluded throughout the remainder of the neck. Left carotid system: CCA and ICA patent within the neck without stenosis. Minimal calcified plaque within the carotid bifurcation. Tortuosity of the distal cervical ICA. Vertebral arteries: Vertebral arteries patent within the neck. Nonstenotic calcified plaque at the origin of the right vertebral artery. Calcified plaque results in moderate stenosis at the origin of the left vertebral artery. Skeleton: Cervical spondylosis. No acute bony abnormality or aggressive osseous lesion. Other neck: No neck mass or cervical lymphadenopathy. Thyroid unremarkable. Upper chest: No consolidation within the  imaged lung apices. Prior median sternotomy. Review of the MIP images confirms the above findings CTA HEAD FINDINGS Anterior circulation: The right ICA remains occluded intracranially with the exception of the very distal right ICA terminus. The M1 middle cerebral artery is patent, likely due to cross flow via the anterior communicating artery and right A1 segment. However, there is occlusion of a proximal M2 right MCA vessel (series 11, image 20) (series 12, image 19). The intracranial left ICA is patent. Mild calcified plaque within this vessel with no more than mild stenosis. The M1 left middle cerebral artery is patent. No left M2 proximal branch occlusion or proximal high-grade arterial stenosis. The anterior cerebral arteries are patent. Strongly dominant right anterior cerebral artery which supplies the bilateral ACA territories distally. No intracranial aneurysm is identified. Posterior circulation: The intracranial vertebral arteries are patent. The basilar artery is patent. Apparent flow gaps within a right PCA P3/P4 branch, which may reflect short segment occlusions or severe stenoses. The left PCA is patent. Posterior communicating arteries are hypoplastic or absent bilaterally. Venous sinuses: The dural venous sinuses are poorly assessed due to contrast timing. Anatomic variants: As described Review of the MIP images confirms the above findings CT Brain Perfusion Findings: Please note the reliability of the perfusion data is limited due to bolus quality and motion degradation. CBF (<30%) Volume: 56mL Perfusion (Tmax>6.0s) volume: 185mL (predominantly within the right MCA vascular territory). Mismatch Volume: 165mL Infarction Location:Right MCA vascular territory These results were called by telephone at the time of interpretation on 08/07/2020 at 9:37 am to provider Dr. Tessa Lerner, who verbally acknowledged these results. IMPRESSION: CTA neck: 1. The right internal carotid artery becomes occluded shortly  beyond its origin and remains occluded throughout the remainder of the neck. 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 2. The left common carotid and internal carotid arteries are patent within the neck without stenosis. Mild calcified plaque within the left carotid bifurcation. 3. Vertebral arteries patent within the neck. Moderate atherosclerotic narrowing at the origin of the left vertebral artery. 4. The partially imaged  ascending thoracic aorta measures up to 4 cm in diameter. Nonemergent CTA of the chest is recommended to more fully assess the degree of acid ending thoracic aortic dilation, and to determine appropriate follow-up. CTA head: 1. The intracranial right internal carotid artery is occluded with the exception of the very distal right ICA terminus. 2. The M1 right middle cerebral artery is patent, likely due to cross flow via the anterior communicating artery and right A1 segment. However, there is occlusion of a proximal right M2 MCA vessel. 3. Apparent flow gaps within a right PCA P3/P4 branch, which may reflect short-segment occlusions or high-grade stenoses. CT perfusion head: 1. Please note the reliability of the perfusion data is limited due to bolus quality and motion degradation. 2. The perfusion software identifies a 45 mL core infarct within the right MCA vascular territory. However, the size of the core infarct is acutely greater than 3000 songs likely underestimated when correlating with the concurrently performed noncontrast head CT. 3. The perfusion software identifies a 162 mL region of hypoperfusion predominantly within the right MCA vascular territory. Reported mismatch volume: 117 mL. Electronically Signed: By: Kellie Simmering DO On: 08/07/2020 10:03    Labs:  Basic Metabolic Panel: Lab 62/70/35 0610 08/02/20 0093 08/03/20 8182 08/04/20 0455 08/05/20 0503 08/07/20 0854  NA 138  --  134*  --   --  134*  K 4.1 3.9 4.0 4.3 4.4 4.1  CL 109  --  101  --   --  104   CO2 21*  --  27  --   --  24  GLUCOSE 145*  --  151*  --   --  116*  BUN 26*  --  24*  --   --  29*  CREATININE 0.74  --  0.81  --   --  0.92  CALCIUM 8.6*  --  8.8*  --   --  8.8*  MG 2.2 2.1 2.0 2.2 2.1  --     CBC: Lab 08/01/20 0610 08/07/20 0854  WBC 13.4* 17.7*  NEUTROABS 11.7* 14.4*  HGB 10.4* 13.1  HCT 31.1* 38.3*  MCV 94.0 92.1  PLT 214 311    CBG: Recent Labs  Lab 08/06/20 1611 08/06/20 2101 08/07/20 0625 08/07/20 0845 08/07/20 0859  GLUCAP 153* 134* 146* 125* 112*    Brief HPI:   [name] [name] is a 80 y.o. male with history of diastolic CHF, AVR/MVR, aortic aneurysm, A. fib/A. fib flutter with recurrence-on Tikosyn/Coumadin; who was admitted on 07/27/2020 after waking up with difficulty speaking and decreased movement on right side.  Of note patient has sustained a fall at church today prior to admission and had struck the back of his head with negative work-up in ED.  At admission patient was noted to be obtunded and, had INR 3.4 and CT of head done revealing large right greater than left subdural hemorrhage with mass-effect, subfalcine herniation and leftward midline shift with early left lateral ventricle entrapment.  He was intubated for airway protection and Coumadin was reversed.  Neurosurgery felt that patient with poor prognosis due to unequal pupils and minimal gag reflex.   Family elected DNR with comfort care however on 05/24 patient was showing improvement in ability to follow commands with repeat CT of head showing persistent bilateral SDH.  He was taken to the OR that day for right craniotomy for evacuation of right SDH by Dr. Ronnald Ramp.  He tolerated extubation without difficulty 05/25 and noted to be limited by  left greater than right-sided weakness, visual deficits with difficulty scanning environment, strong posterior bias, mild sensory deficits as well as poor safety awareness.  CIR was recommended to functional decline.  Hospital Course: [name] [name]  was admitted to rehab 07/31/2020 for inpatient therapies to consist of PT, ST and OT at least three hours five days a week. Past admission physiatrist, therapy team and rehab RN have worked together to provide customized collaborative inpatient rehab.  At admission required min assist for mobility and mod assist closely for ADL tasks.  He exhibited mild cognitive impairments with SLUMS score 14/30 with perseverative thinking and problems with recall and carryover.  Coumadin was held with plans for repeat CT in a few days and follow-up with neurosurgery for input.  His blood pressure and heart rate were monitored on 3 times daily basis and were relatively stable.  Headaches were managed with as needed use of hydrocodone.  Follow-up labs done revealing renal azotemia and he was encouraged to increase fluid intake.  Flomax and Proscar was resumed due to history of BPH.  Steroid-induced leukocytosis was monitored without any signs of infection. He was noted to have elevated blood sugars due to prediabetes and blood sugars were monitored on ac/hs CBG checks with SSI was use prn for tighter BS control. Pharmacy was consulted to follow for monitoring and dosing of Tikosyn.  Follow-up CT head on 05/30 showed decreased subdural hemorrhage with diminishing cortical mass-effect and mildly decreased left parietal hemorrhage with adjacent edema and hematoma.  He was started on slow Decadron taper.He was making progress with therapy and required min to max assist with ADL tasks. On am of 06/03, he was noted have decrease in LOC with decreased movement on the left. Stat CT done revealing large endoluminal thrombus stroke.  Code stroke was initiated and patient was discharged to acute for management.   Diet: NPO till cleared by ST.   Medications at discharge: 1.  Acidophilus 1 capsule  twice daily. 2. Decadron 2 mg twice daily. 3.  Tikosyn 500 mg twice daily. 4.  Lasix 20 mg/day 5.  Proscar 5 mg daily 6.  Keppra 500 mg  twice daily 7.  Losartan 25 mg daily 8.  Claritin 10 mg daily 9.  Metoprolol 50 mg twice daily 10.  Multivitamin 1 daily 11. K dur  20 mEq/day 12.  Senna 1 twice daily 13.  Flomax 0.8 mg nightly 14.  Trazodone 50 mg nightly as needed 15.  Vitamin B12 5000 mcg 20 16.  Lipitor 80 mg daily   Disposition: Acute Hospital for management.    Signed: Bary Leriche 08/10/2020, 1:27 AM

## 2020-08-07 NOTE — Progress Notes (Signed)
Spoke with Dr. Estanislado Pandy about pt's systolic BP being lower than 120 (112/80). IR MD said that he was content with systolic bp 028-902.

## 2020-08-07 NOTE — H&P (Signed)
Neurology H&P Reason for Consult: Code stroke Requesting Physician: Alger Simons   CC: Left sided weakness  History is obtained from: Chart review with key details and wife at bedside  HPI: [name] [name] is a 80 y.o. male with past medical history significant for atrial fibrillation on warfarin, aortic valve replacement, mitral valve replacement, acute rheumatic pericarditis (at age 55/7), stroke (2015, secondary to A. fib no residual)  On 07/26/2020 he was in church when he momentarily slumped over, after which he recovered finished singing a hymn and was moving to stand towards the back of the church with his wife when he suddenly became pale, lost consciousness and fell backwards striking his head.  Work-up including head CT was negative and options for observation in the hospital versus close outpatient follow-up was discussed with patient preferring to go home.   His wife noted that he did not sleep well that night secondary to bilateral neck pain attributed to his fall as well as due to the cervical collar.  This was notably not unilateral but bilateral neck pain.  Otherwise he had been in his normal state of health and function until he lay down for a nap at 4 PM and then on awakening was found to be not speaking.  He arrived to the hospital as a code stroke shortly after 6 PM on 5/23 and was found to have a large subdural hematoma.  Examination at the time was notable for obtunded patient, with minimal eye-opening to sustained noxious stimulation, equal round and reactive pupils, roving eye movements, right nasolabial fold flattening, flexor posturing in the right upper extremity with withdrawal versus flexion in the right lower extremity and spontaneous movement of the left side.  Initially on neurosurgical evaluation the prognosis was felt to be extremely poor and decision was made for comfort care.  However the next day the patient was noted to be opening his eyes more and following commands  at which time decision was made to evacuate the hematoma.  He was recovering well, with some intermittent confusion but able to converse, follow commands and 4/5 strength in all 4 extremities per PM&R note.  There was a plan to restart Coumadin today with clearance per Dr. Ronnald Ramp of neurosurgery.  Unfortunately, this morning the patient was noted to be somnolent and not moving his left side.  Code stroke was activated after head CT revealed hypodensity in the left MCA area.  CTA/CTP were obtained and demonstrated significant tissue at risk despite large core, right carotid occlusion.  Risks and benefits were discussed with the patient's family by Dr. Estanislado Pandy and myself and family elected to proceed with revascularization procedure.  Specifically we discussed that there is already significant area of the brain that was damaged but given the extent of the blockage there was likely to be more progressive damage if no intervention was made.  We discussed that in the best case scenario he will continue to have significant left-sided weakness, and likely a change in personality and memory, difficulty paying attention to things on the left side, difficulty seeing things on the left side, but would retain ability to understand verbal communication and to communicate his own needs given that he is right-handed and the right side of the brain is unaffected.  LKW: 8 PM on 6/2 tPA given?: No, due to out of the window and recent SDH with evacuation IA performed?:  Yes, TICI2c reperfusion achieved, with contrast staining in the right parietal region Premorbid modified rankin scale: 0  ROS: Unable to obtain due to altered mental status.   Past Medical History:  Diagnosis Date  . Acute rheumatic heart disease, unspecified    childhood, age  3 & 29  . Acute rheumatic pericarditis   . Atrial fibrillation ([place])    history  . CHF (congestive heart failure) ([place])   . Diverticulosis   . Dysrhythmia   . 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 External hemorrhoids without mention of complication   . H/O aortic valve replacement   . H/O mitral valve replacement   . 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 Lesion of ulnar nerve   . 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 Other and unspecified hyperlipidemia   . Pre-diabetes   . Previous back surgery 1978, jan 2007  . Psychosexual dysfunction with inhibited sexual excitement   . SOB (shortness of breath)    "with heavy exercise"  . Stroke ([place]) 08/2013   "I WAS IN AFIB AND THREW A CLOT, THE EFFECTS WERE TRANSITORY AND DIDNT LAST BUT FOR 30 MINUTES"   . Thoracic aortic aneurysm [facility])    Past Surgical History:  Procedure Laterality Date  . AORTIC AND MITRAL VALVE REPLACEMENT     09/2004  . CARDIOVERSION     3 times from 2004-2006  . CARDIOVERSION N/A 09/26/2013   Procedure: CARDIOVERSION;  Surgeon: Larey Dresser, MD;  Location: [place];  Service: Cardiovascular;  Laterality: N/A;  . CARDIOVERSION N/A 06/19/2014   Procedure: CARDIOVERSION;  Surgeon: Jerline Pain, MD;  Location: [place];  Service: Cardiovascular;  Laterality: N/A;  . CARDIOVERSION N/A 04/24/2017   Procedure: CARDIOVERSION;  Surgeon: Larey Dresser, MD;  Location: [facility] ENDOSCOPY;  Service: Cardiovascular;  Laterality: N/A;  . CARDIOVERSION N/A 06/08/2017   Procedure: CARDIOVERSION;  Surgeon: Pixie Casino, MD;  Location: [facility] ENDOSCOPY;  Service: Cardiovascular;  Laterality: N/A;  . CARDIOVERSION N/A 08/24/2017   Procedure: CARDIOVERSION;  Surgeon: Skeet Latch, MD;  Location: [facility] ENDOSCOPY;  Service: Cardiovascular;  Laterality: N/A;  . CARDIOVERSION N/A 02/14/2018   Procedure: CARDIOVERSION;  Surgeon: Larey Dresser, MD;  Location: [facility] ENDOSCOPY;  Service: Cardiovascular;  Laterality: N/A;  . COLONOSCOPY    . CRANIOTOMY N/A 07/28/2020   Procedure: CRANIOTOMY FOR EVACUATION OF SUBDURAL HEMATOMA;  Surgeon: Eustace Lesch, MD;  Location: [place];  Service:  Neurosurgery;  Laterality: N/A;  . laminectomies     10/1975 and in 03/2005  . TONSILLECTOMY     1950  . TOTAL HIP ARTHROPLASTY Right 04/03/2018   Procedure: TOTAL HIP ARTHROPLASTY ANTERIOR APPROACH;  Surgeon: Paralee Cancel, MD;  Location: [facility];  Service: Orthopedics;  Laterality: Right;  70 mins   Current Outpatient Medications  Medication Instructions  . atorvastatin (LIPITOR) 80 MG tablet 1 tablet, Oral, Daily  . atorvastatin (LIPITOR) 80 mg, Oral, Every evening  . clobetasol cream (TEMOVATE) 2.62 % 1 application, Topical, Daily PRN  . Co Q-10 300 mg, Oral, Daily  . COVID-19 mRNA vaccine, Moderna, 100 MCG/0.5ML injection Intramuscular  . docusate sodium (COLACE) 100 mg, Oral, Daily  . dofetilide (TIKOSYN) 500 mcg, Oral, 2 times daily  . dofetilide (TIKOSYN) 500 mcg, Oral, Every 12 hours  . finasteride (PROSCAR) 5 mg, Oral, Daily  . furosemide (LASIX) 20 mg, Oral, Daily  . Glucosamine-Chondroitin (COSAMIN DS PO) 1 tablet, Oral, 2 times daily  . loratadine (CLARITIN) 10 mg, Oral, Daily PRN  . losartan (COZAAR) 25 mg, Oral, Daily  .  losartan (COZAAR) 25 mg, Oral, Daily  . metoprolol succinate (TOPROL-XL) 50 MG 24 hr tablet Take 75 mg (1.5 tabs) by mouth twice daily. Take with or immediately following a meal.  . metoprolol succinate (TOPROL-XL) 50 mg, Oral, Every 12 hours  . Multiple Vitamins-Minerals (MULTIVITAL-M) TABS 1 tablet, Oral, Daily  . potassium chloride SA (KLOR-CON) 20 MEQ tablet 20 mEq, Oral, Daily  . potassium chloride SA (KLOR-CON) 20 MEQ tablet 20 mEq, Oral, Daily  . Probiotic Product (PROBIOTIC FORMULA PO) 1 capsule, Oral, 2 times daily  . tamsulosin (FLOMAX) 0.4 mg, Oral, 2 times daily  . tamsulosin (FLOMAX) 0.8 mg, Oral, Every 12 hours  . Vitamin B-12 5,000 mcg, Sublingual, Daily  . warfarin (COUMADIN) 5 MG tablet As directed  . warfarin (COUMADIN) 5 mg, Oral, Daily     Family History  Problem Relation Age of Onset  . Macular degeneration Mother         macular degeneration  . Cancer Father        intestinal/GI  . Diabetes Paternal Aunt   . Heart attack Maternal Grandfather   . Diabetes Brother    Social History:  reports that he has never smoked. He has never used smokeless tobacco. He reports that he does not drink alcohol and does not use drugs.  Exam: Current vital signs: BP 136/86   Pulse 65   Temp (!) 97.5 F (36.4 C) (Axillary)   Resp 16   SpO2 100%  Vital signs in last 24 hours: Temp:  [97.5 F (36.4 C)-97.6 F (36.4 C)] 97.5 F (36.4 C) (06/03 1200) Pulse Rate:  [62-71] 65 (06/03 1245) Resp:  [16-22] 16 (06/03 1245) BP: (121-138)/(73-86) 136/86 (06/03 1245) SpO2:  [97 %-100 %] 100 % (06/03 1245) FiO2 (%):  [100 %] 100 % (06/03 1209)   Physical Exam  Constitutional: Appears well-developed and well-nourished, acutely ill  Psych: Affect flat, intermittently interactive when more awake  Eyes: No scleral injection HENT: No oropharyngeal obstruction.  MSK: no joint deformities.  Cardiovascular: Normal rate and regular rhythm.  Respiratory: Effort normal, non-labored breathing GI: Soft.  No distension. There is no tenderness.  Skin: Warm dry and intact visible skin  Neuro: Mental Status: Eyes are closed and patient seems somnolent but briskly follows commands to show me 2 fingers, 5 fingers, thumbs up, sticks out his tongue but has trouble opening his eyes. Non-verbal even with noxious stimulation.  Cranial Nerves: II: Visual Fields -- no blink to threat, when I ask patient to hold up the same number of fingers he sees he perseverates on holding up 2 fingers. Pupils are equal, round, and reactive to light 2 mm to 1 mm III,IV, VI: EOMI with possible eyelid opening apraxia. Roving eye movements at times, very slow to look bilaterally but then also does appear to look bilaterally to command with slightly restricted vertical gaze.  V: Facial sensation is symmetric to eyelash brush VII: Facial movement is notable for a  left facial droop VIII: hearing is intact to voice Sensory/Motor: Briskly reactive to noxious stimulation grossly equally in all 4 extremities.  He is able to maintain the right arm antigravity for full, and without drift.  The right leg does drift to the bed, as well as the left leg.  Left upper extremity has only extensor posturing. Cerebellar: Unable to assess secondary to patient's mental status   NIHSS total 20 Score breakdown: 1 point for drowsiness, 2 points for not answering questions, 3 points for apparent cortical blindness,  2 points for partial left facial droop, 3 points for extensor posturing of the left upper extremity, 2 points for drift down to the bed of the left lower extremity, 2 points for drift down to the bed of the right lower extremity, 3 points for being mute, 2 points for unintelligible speech  I have reviewed labs in epic and the results pertinent to this consultation are: Creatinine 0.92 leukocytosis to 17.7 platelets 311  I have reviewed the images obtained: Head CT with hypodensity right MCA vessel sign, aspects 5 (hypodensities in the insula, M2, M3, M5, M6 areas) "1. 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 Asymmetric hyperdensity of the M1 and M2 right middle cerebral artery vessels, likely reflecting endoluminal thrombus. 2. Unchanged subdural hemorrhage overlying the right cerebral hemisphere. 3. Continued interval decrease in conspicuity of interhemispheric subdural and subarachnoid hemorrhage. 4. Unchanged size of an extra-axial hemorrhage overlying the left parietal lobe, again measuring up to 10 mm in thickness. 5. Persistent trace intraventricular hemorrhage. No evidence of hydrocephalus. 6. Unchanged mass effect with 4 mm leftward midline shift. 7. Right maxillary sinusitis."  CTA neck: 1. The right internal carotid artery becomes occluded shortly  beyond its origin and remains occluded throughout the remainder of the neck. 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  2. The left common carotid and internal carotid arteries are patent within the neck without stenosis. Mild calcified plaque within the left carotid bifurcation.  3. Vertebral arteries patent within the neck. Moderate atherosclerotic narrowing at the origin of the left vertebral artery. 4. The partially imaged ascending thoracic aorta measures up to 4 cm in diameter. Nonemergent CTA of the chest is recommended to more fully assess the degree of acid ending thoracic aortic dilation, and to determine appropriate follow-up.  CTA head: 1. The intracranial right internal carotid artery is occluded with the exception of the very distal right ICA terminus. 2. The M1 right middle cerebral artery is patent, likely due to cross flow via the anterior communicating artery and right A1 segment. However, there is occlusion of a proximal right M2 MCA vessel. 3. Apparent flow gaps within a right PCA P3/P4 branch, which may reflect short-segment occlusions or high-grade stenoses.  CT perfusion head: 1. Please note the reliability of the perfusion data is limited due to bolus quality and motion degradation. 2. The perfusion software identifies a 45 mL core infarct within the right MCA vascular territory. However, the size of the core infarct is likely underestimated when correlating with the concurrently performed noncontrast head CT. 3. The perfusion software identifies a 162 mL region of hypoperfusion predominantly within the right MCA vascular territory. Reported mismatch volume: 117 mL.  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  Decision to proceed despite aspects of 5 was based on  baseline functional status, examination showing preserved ability to understand speech and move 3 out of 4 limbs, as well as large penumbra.    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 -Hyperglycemia management per SSI to maintain glucose 140-180mg /dL. -PT/OT/ST therapies and recommendations when able  CNS Cerebral edema -Close neuro monitoring  Dysarthria Dysphagia following cerebral infarction  -NPO until cleared by speech -ST -Advance diet as tolerated -May need PEG  Hemiplegia and hemiparesis following cerebral infarction affecting left non-dominant side  -PT/OT -PM&R consult  RESP Acute Respiratory Failure  -Appreciate vent management per ICU -wean when able  CV Essential (primary) hypertension -Aggressive BP control, goal SBP < 140 -Titrate oral agents  Heart failure, unspecified -TTE  Hyperlipidemia, unspecified  - Statin for goal LDL < 70  Paroxysmal atrial fibrillation -Rate control, continue metoprolol and home dofetilide -Hold anticoagulation for now pending clinical course, minimum 4 days unless life-threatening indication arises   Thoracic aortic aneurysm -Known problem is being followed in [place], recent imaging in May -Follow-up outside records  HEME No active anemia -Monitor -transfuse for hgb < 7  ENDO Type 2 diabetes mellitus w/o complications. Type 2 diabetes mellitus with hyperglycemia  -SSI -goal HgbA1c < 7  GI/GU BPH -Gentle hydration -avoid nephrotoxic agents -Continue home medications  Fluid/Electrolyte Disorders -Monitor -Replete  as needed  ID Possible Aspiration PNA -CXR -NPO -Monitor  Nutrition N.p.o. overnight -diet consult  Prophylaxis DVT: Start subcutaneous prophylactic heparin in 24 hours if patient remains stable, SCDs for now GI: Pantoprazole Bowel: Senna as needed  Diet: NPO until cleared by speech  Code Status: Full Code    THE FOLLOWING WERE PRESENT ON ADMISSION: CNS -  Acute Ischemic Stroke, Cerebral Edema, SAH, SDH, ICH, IVH, Hemiparesis Respiratory - Probable Aspiration Pneumonia Trauma - Head trauma with traumatic intracerebral hemorrhage as above s/p recent evacuation  Lesleigh Noe MD-PhD [facility] [phone]  Total critical care time: 95 minutes   Critical care time was exclusive of separately billable procedures and treating other patients.   Critical care was necessary to treat or prevent imminent or life-threatening deterioration.   Critical care was time spent personally by me on the following activities: development of treatment plan with patient and/or surrogate as well as nursing, discussions with consultants/primary team, evaluation of patient's response to treatment, examination of patient, obtaining history from patient or surrogate, ordering and performing treatments and interventions, ordering and review of laboratory studies, ordering and review of radiographic studies, and re-evaluation of patient's condition as needed, as documented above.

## 2020-08-07 NOTE — Progress Notes (Signed)
PROGRESS NOTE   Subjective/Complaints:  Pt somnolent this morning. Unable to arouse. Nurse reported that he was awake and alert earlier in the morning. Has been very alert and more appropriate the last few AM's. Wife at bedside  ROS: Limited due to cognitive/behavioral   Objective:   No results found. No results for input(s): WBC, HGB, HCT, PLT in the last 72 hours. Recent Labs    08/05/20 0503  K 4.4    Intake/Output Summary (Last 24 hours) at 08/07/2020 0912 Last data filed at 08/06/2020 2059 Gross per 24 hour  Intake 440 ml  Output 700 ml  Net -260 ml        Physical Exam: Vital Signs Blood pressure 127/80, pulse 71, temperature 97.6 F (36.4 C), temperature source Oral, resp. rate 18, SpO2 97 %.  Constitutional: somnolent.  Vital signs reviewed. HEENT: EOMI, oral membranes moist Neck: supple Cardiovascular: RRR without murmur. No JVD    Respiratory/Chest: CTA Bilaterally without wheezes or rales. Normal effort    GI/Abdomen: BS +, non-tender, non-distended Ext: no clubbing, cyanosis, or edema Psych: somnolent  Skin: right crani CDI  Neuro: follows basic commands with right arm, "squeeze my hand". Moves right spontaneously and left side only in response to pain. Keeps eye closed. Non-verbal. Pupils 2-26mm. Musculoskeletal: mild cervical discomfort     Assessment/Plan: 1. Functional deficits which require 3+ hours per day of interdisciplinary therapy in a comprehensive inpatient rehab setting.  Physiatrist is providing close team supervision and 24 hour management of active medical problems listed below.  Physiatrist and rehab team continue to assess barriers to discharge/monitor patient progress toward functional and medical goals  Care Tool:  Bathing    Body parts bathed by patient: Right arm,Left arm,Chest,Abdomen,Front perineal area,Buttocks,Right upper leg,Left upper leg,Left lower leg,Face   Body  parts bathed by helper: Right lower leg     Bathing assist Assist Level: Minimal Assistance - Patient > 75%     Upper Body Dressing/Undressing Upper body dressing   What is the patient wearing?: Pull over shirt    Upper body assist Assist Level: Moderate Assistance - Patient 50 - 74%    Lower Body Dressing/Undressing Lower body dressing      What is the patient wearing?: Underwear/pull up,Pants     Lower body assist Assist for lower body dressing: Moderate Assistance - Patient 50 - 74%     Toileting Toileting    Toileting assist Assist for toileting: Maximal Assistance - Patient 25 - 49%     Transfers Chair/bed transfer  Transfers assist     Chair/bed transfer assist level: Minimal Assistance - Patient > 75%     Locomotion Ambulation   Ambulation assist      Assist level: Minimal Assistance - Patient > 75% Assistive device: Walker-rolling Max distance: 267 ft   Walk 10 feet activity   Assist     Assist level: Minimal Assistance - Patient > 75% Assistive device: Walker-rolling   Walk 50 feet activity   Assist    Assist level: Minimal Assistance - Patient > 75% Assistive device: Walker-rolling    Walk 150 feet activity   Assist    Assist level: Minimal Assistance -  Patient > 75% Assistive device: Walker-rolling    Walk 10 feet on uneven surface  activity   Assist Walk 10 feet on uneven surfaces activity did not occur: Safety/medical concerns (pt fatigue)         Wheelchair     Assist Will patient use wheelchair at discharge?: No (TBD but do not anticipate)             Wheelchair 50 feet with 2 turns activity    Assist            Wheelchair 150 feet activity     Assist          Blood pressure 127/80, pulse 71, temperature 97.6 F (36.4 C), temperature source Oral, resp. rate 18, SpO2 97 %.  Medical Problem List and Plan: 1.  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 Pt s/p Right-sided craniotomy 5/24             -patient may shower             -ELOS/Goals: 18-21 days, supervision to min assist goals with PT, OT, SLP  -Continue CIR therapies including PT, OT, and SLP   -Head CT from 5/30 with improvement  -decadron taper--continue at 2mg  bid  -pt with acute mental status change this morning   -STAT Head CT ordered   -STAT labs   -VSS, CBG's reasonable.   -supportive care, close neuro-monitoring   -reviewed plan with Mrs. Meenach 2.  Antithrombotics: -DVT/anticoagulation:  Mechanical: Sequential compression devices, below knee Bilateral lower extremities-  -HCT demonstrated improvement. I spoke with Dr. Ronnald Ramp who agrees to start coumadin today. Given changes above will hold coumadin for now pending work up.             -antiplatelet therapy: N/A 3. Pain Management: Hydrocodone prn.  4. Mood: LCSW to follow for evaluation and support.              -antipsychotic agents: NA 5. Neuropsych: This patient is not capable of making decisions on his own behalf. 6. Skin/Wound Care: Routine pressure relief measures.  7. Fluids/Electrolytes/Nutrition: I personally reviewed the patient's labs today.     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 Need to Keep K+>4.0 and Mg 2.1.--K+ within range--   6/2- added magnesium and continue potassium supplementation             -monitor for syncopal episodes, symptomatic arhythmia              -cards f/u as needed  -recheck labs Monday 9. 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              --continue Lasix, Cozaar, metoprolol and prn hydralazine.     Vitals:   08/07/20 0513 08/07/20 0814  BP: 137/73 127/80  Pulse: 63 71  Resp: 18   Temp: 97.6 F  (36.4 C)   SpO2: 98% 97%  reasonable control 6/3 10. BPH: Resume Flomax and Proscar.  11. Diastolic CHF: Monitor weights daily and for any signs of overload.              --  continue Lasix, hydralazine and Lipitor.  There were no vitals filed for this visit.--daily weights needed   12. Pre-diabetes: Hgb A1c-6.3 07/2019. Will monitor BS ac/hs while on steroids and use SSI for elevated BS.   -steroid related             --continue dietary restrictions.  13. Acute blood loss anemia: hgb 10.4 5/28 14. Leukocytosis: afebrile, steroid related-no signs of infection      LOS: 7 days A FACE TO FACE EVALUATION WAS PERFORMED  Meredith Staggers 08/07/2020, 9:12 AM

## 2020-08-07 NOTE — Progress Notes (Signed)
Groin and pulses assess at bedside with Merrilee Seashore, RN. No change, see flowsheet.

## 2020-08-07 NOTE — Progress Notes (Signed)
RT NOTES: Transported patient to MRI and back to room 4N30 on vent without incident.

## 2020-08-07 NOTE — Sedation Documentation (Signed)
Groin and pulses assess upon arrival to 4N with Merrilee Seashore, RN. No changes, see flowsheet.

## 2020-08-07 NOTE — Progress Notes (Signed)
Patient presented this morning able to open eyes and wipe face with right hand but unable to talk.Preseneted lethargic with nurse, wife and therapy. Vitals taken within range. Morning medications held due lethargy and no verbal response. Felt unsafe to administer. Patient presented to favor RUE and left sided lean and LUE flaccid. Patient able to open eyes. Therapy reported to MD. MD and PA assessed patient. Stat orders placed  And rapid response called. Nurse and rapid accompanied patient to CT. Stoke called on patient transferred to IR then admitted to 4N ICU. Sanda Linger, LPN

## 2020-08-07 NOTE — Procedures (Signed)
S/P RT common carotid arteriogram  RT CFA approach  S/P revascularization of occluded RT ICA to the terminus with x1 pass with solitaire 60mm x 40 mm retriever and x 1 pass with 6 mm x 47 mm embotrap retriever and  contact aspiration  Achieving a TICI 2C revascularization. Post brain mild hyper attenuation in the parietal convexity region and contrast stain in the RT parietal temp region . No gross mass effect 67F angioseal for C FA hemostasis. Distal pulses present bilaterally. Left intubated. Pupils 9mm RT = LT. S.Kail Fraley MD

## 2020-08-07 NOTE — Significant Event (Signed)
Rapid Response Event Note   Reason for Call :  Acute change in mental status, patient somnolent  Initial Focused Assessment:  Patient responds to painful stimulation.  He is moving his right side purposefully.  He is mute, posturing with his left arm and moves his left leg a little.  Per staff and wife he was able to walk and speak yesterday no focal left side weakness.  LKW about 8pm  BP 127/80  HR 71  RR 20  O2 sat 97% on RA CBG 112  NIHSS: 20 LOC 1 Questions: 2 Commands: 0 Gaze: 0 Visual: 3 Facial: 1 Left Arm: 3, posturing Right Arm: 0 Left Leg: 2 Right Leg: 2 Ataxia: 0 Sensory: 0 Language: 3 Dysarthria: 2 Extinction: 0  Interventions:  Stat Head CT Code Stroke called Dr Curly Shores at bedside to assess patient IV placed right AC by CT  CTA and CTP done IR Code Stroke activated   Plan of Care:  IR intervention Admit to ICU post intervention   Event Summary:   MD Notified: Dr Naaman Plummer prior to my arrival Call Time: 0852 Arrival Time: 0102 End Time:  [place]  Raliegh Ip, RN

## 2020-08-07 NOTE — Consult Note (Signed)
NAME:  [name] [name], MRN:  [id number], DOB:  Jul 26, 1940, LOS: 0 ADMISSION DATE:  08/07/2020, CONSULTATION DATE:  08/07/20 REFERRING MD:  Estanislado Pandy - NIR, CHIEF COMPLAINT:  AMS - R MCA CVA - code stroke  History of Present Illness:  80 yo M PMH Afib (on warfarin), thoracic aortic aneurysm, s/p AVR/MVR, CHF who recently admitted for traumatic SDH following a fall while on warfarin. Pt was admitted 5/23 and taken for crani/evac, ultimately discharged to [facility] 5/27 where he has remained. Warfarin continued to be held in CIR with plan to resume 6/3.  On 6/3 pt noted to be somnolent, and ultimately a code stroke was initiated. CT H reveals large R MCA CVA. Pt taken urgently for thrombectomy, with TICI 2c revascularization. He remains intubated following the case and PCCM is asked to consult in this setting.   Labs at time of admission  BMP - Na 134 Cr 0.92 BUN 29  CBC - WBC 17.7 hgb 13.1 Glu 116  Pertinent  Medical History  Traumatic SDH (07/2020. Ground level fall, on warfarin)  Afib/flutter Aortic aneurysm  CHF Hemorrhoids TIA  HLD Rheumatic pericarditis  S/p MVR AVR   Significant Hospital Events: Including procedures, antibiotic start and stop dates in addition to other pertinent events   . AMS found to have large R MCA CVA taken to [facility] and admitted to neuro ICU following procedure, remains intubated. WBC 17, LLL opacities -- starting unasyn   Interim History / Subjective:  Remains intubated following NIR, where pt was taken urgently for management of R MCA occlusion  Objective   Blood pressure 126/82, pulse 62, resp. rate (!) 22, SpO2 100 %.    Vent Mode: PRVC FiO2 (%):  [100 %] 100 % Set Rate:  [18 bmp] 18 bmp Vt Set:  [540 mL] 540 mL PEEP:  [5 cmH20] 5 cmH20 Plateau Pressure:  [17 cmH20] 17 cmH20   Intake/Output Summary (Last 24 hours) at 08/07/2020 1212 Last data filed at 08/07/2020 1146 Gross per 24 hour  Intake 450 ml  Output 475 ml  Net -25 ml   There were no vitals filed  for this visit.  Examination: General: Critically ill appearing elderly M intubated sedated NAD HENT: NCAT pink mmm anicteric sclera ETT secure Lungs: CTAb symmetrical chest expansion, mechanically ventialted Cardiovascular: s1s2 no rgm cap refill brisk  Abdomen: soft round ndnt + bowel sounds  Extremities: No acute joint deformity no cyanosis or clubbing no edema Neuro: Sedated. Moving BLE RUE spontaneously and to command. PERRL GU: yellow urine   Labs/imaging that I havepersonally reviewed  (right click and "Reselect all SmartList Selections" daily)  6/3 CTH> New large R MCA infarct. 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 Cleviprex available.  -anticoag per neuro -statin   Acute respiratory failure requiring mechanical MV -in setting of CVA and procedure P -CXR, ABG -wean vent as able -will eval for SBTs when mobility restrictions have concluded   Leukocytosis -WBC 17.7 Unclear etiology but consider infectious. Could also be r/t stable blood in brain. If infectious, aspiration possible given CVA P -PCT -will start unasyn, short course and possibly dc if PCT and repeat WBC reassuring -send tracheal aspirate  -trend WBC and fever curve  Hx [place] -stable on imaging 6/3  -admitted 5/23, taken for evac discharged to [place] 5/27  -plan was to start back warfarin 6/3   Hx Afib/flutter  S/p AVR and MVR (bioprosthetic)  HTN P -anticoag per neuro  -plan from CIR notes was to start 6/3  -Scheduled metop, follow CVA SBP goal 120-140 -- as above, cleviprex available if needed -tikosyn   BPH -proscar -flomax   Best practice (right click and "Reselect all SmartList Selections" daily)  Diet:   NPO Pain/Anxiety/Delirium protocol (if indicated): Yes (RASS goal -1) VAP protocol (if indicated): Yes DVT prophylaxis: SCD GI prophylaxis: PPI Glucose control:  SSI No Central venous access:  N/A Arterial line:  N/A Foley:  Yes, and it is still needed Mobility:  bed rest  PT consulted: N/A Last date of multidisciplinary goals of care discussion [per primary] Code Status:  full code Disposition: ICU   Labs   CBC: Recent Labs  Lab 08/01/20 0610 08/07/20 0854  WBC 13.4* 17.7*  NEUTROABS 11.7* 14.4*  HGB 10.4* 13.1  HCT 31.1* 38.3*  MCV 94.0 92.1  PLT 214 096    Basic Metabolic Panel: Recent Labs  Lab 08/01/20 0610 08/02/20 0548 08/03/20 0428 08/04/20 0455 08/05/20 0503 08/07/20 0854  NA 138  --  134*  --   --  134*  K 4.1 3.9 4.0 4.3 4.4 4.1  CL 109  --  101  --   --  104  CO2 21*  --  27  --   --  24  GLUCOSE 145*  --  151*  --   --  116*  BUN 26*  --  24*  --   --  29*  CREATININE 0.74  --  0.81  --   --  0.92  CALCIUM 8.6*  --  8.8*  --   --  8.8*  MG 2.2 2.1 2.0 2.2 2.1  --    GFR: Estimated Creatinine Clearance: 74.3 mL/min (by C-G formula based on SCr of 0.92 mg/dL). Recent Labs  Lab 08/01/20 0610 08/07/20 0854  WBC 13.4* 17.7*    Liver Function Tests: Recent Labs  Lab 08/01/20 0610  AST 39  ALT 52*  ALKPHOS 46  BILITOT 1.1  PROT 5.9*  ALBUMIN 2.8*   No results for input(s): LIPASE, AMYLASE in the last 168 hours. No results for input(s): AMMONIA in the last 168 hours.  ABG    Component Value Date/Time   PHART 7.434 07/27/2020 1949   PCO2ART 40.2 07/27/2020 1949   PO2ART 504 (H) 07/27/2020 1949   HCO3 27.0 07/27/2020 1949   TCO2 28 07/27/2020 1949   O2SAT 100.0 07/27/2020 1949     Coagulation Profile: No results for input(s): INR, PROTIME in the last 168 hours.  Cardiac Enzymes: No results for input(s): CKTOTAL, CKMB, CKMBINDEX, TROPONINI in the last 168 hours.  HbA1C: Hgb A1c MFr Bld  Date/Time Value Ref Range Status   08/01/2020 06:10 AM 6.2 (H) 4.8 - 5.6 % Final    Comment:    (NOTE)         Prediabetes: 5.7 - 6.4         Diabetes: >6.4         Glycemic control for adults with diabetes: <7.0   07/31/2020 04:12 PM 6.2 (H) 4.8 - 5.6 % Final    Comment:    (NOTE)         Prediabetes: 5.7 - 6.4  Diabetes: >6.4         Glycemic control for adults with diabetes: <7.0     CBG: Recent Labs  Lab 08/06/20 1611 08/06/20 2101 08/07/20 0625 08/07/20 0845 08/07/20 0859  GLUCAP 153* 134* 146* 125* 112*    Review of Systems:   Unable to obtain, intubated and sedated   Past Medical History:  He,  has a past medical history of Acute rheumatic heart disease, unspecified, Acute rheumatic pericarditis, Atrial fibrillation ([place]), CHF (congestive heart failure) ([place]), Diverticulosis, Dysrhythmia, 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   Surgical History:   Past Surgical History:  Procedure Laterality Date  . AORTIC AND MITRAL VALVE REPLACEMENT     09/2004  . CARDIOVERSION     3 times from 2004-2006  . CARDIOVERSION N/A 09/26/2013   Procedure: CARDIOVERSION;  Surgeon: Larey Dresser, MD;  Location: [place];  Service: Cardiovascular;  Laterality: N/A;  . CARDIOVERSION N/A 06/19/2014   Procedure: CARDIOVERSION;  Surgeon: Jerline Pain, MD;  Location: [place];  Service: Cardiovascular;  Laterality: N/A;  . CARDIOVERSION N/A 04/24/2017   Procedure: CARDIOVERSION;  Surgeon: Larey Dresser, MD;  Location: [facility] ENDOSCOPY;  Service: Cardiovascular;  Laterality: N/A;   . CARDIOVERSION N/A 06/08/2017   Procedure: CARDIOVERSION;  Surgeon: Pixie Casino, MD;  Location[facility] ENDOSCOPY;  Service: Cardiovascular;  Laterality: N/A;  . CARDIOVERSION N/A 08/24/2017   Procedure: CARDIOVERSION;  Surgeon: Skeet Latch, MD;  Location: [facility] ENDOSCOPY;  Service: Cardiovascular;  Laterality: N/A;  . CARDIOVERSION N/A 02/14/2018   Procedure: CARDIOVERSION;  Surgeon: Larey Dresser, MD;  Location: [facility] ENDOSCOPY;  Service: Cardiovascular;  Laterality: N/A;  . COLONOSCOPY    . CRANIOTOMY N/A 07/28/2020   Procedure: CRANIOTOMY FOR EVACUATION OF SUBDURAL HEMATOMA;  Surgeon: Eustace Selinger, MD;  Location: [place];  Service: Neurosurgery;  Laterality: N/A;  . laminectomies     10/1975 and in 03/2005  . TONSILLECTOMY     1950  . TOTAL HIP ARTHROPLASTY Right 04/03/2018   Procedure: TOTAL HIP ARTHROPLASTY ANTERIOR APPROACH;  Surgeon: Paralee Cancel, MD;  Location: [facility] ORS;  Service: Orthopedics;  Laterality: Right;  70 mins     Social History:   reports that he has never smoked. He has never used smokeless tobacco. He reports that he does not drink alcohol and does not use drugs.   Family History:  His family history includes Cancer in his father; Diabetes in his brother and paternal aunt; Heart attack in his maternal grandfather; Macular degeneration in his mother.   Allergies Allergies  Allergen Reactions  . Naproxen Hives  . No Healtht[employer] Food Allergies Other (See Comments)    Scallops - distress, nausea and vomitting     Home Medications  Prior to Admission medications   Medication Sig Start Date End Date Taking? Authorizing Provider  atorvastatin (LIPITOR) 80 MG tablet TAKE 1 TABLET (80 MG TOTAL) BY MOUTH EVERY EVENING. 04/07/20   Larey Dresser, MD  atorvastatin (LIPITOR) 80 MG tablet Take 1 tablet by mouth daily. 06/29/20   [provider]  clobetasol cream (TEMOVATE) 9.79 % Apply 1 application topically daily as needed (for skin irritation).  05/20/14  [provider]  Coenzyme Q10 (CO Q-10) 300 MG CAPS Take 300 mg by mouth daily.    [provider]  COVID-19 mRNA vaccine, Moderna, 100 MCG/0.5ML injection Inject into the muscle. 06/26/20   Carlyle Basques, MD  Cyanocobalamin (VITAMIN B-12) 5000 MCG SUBL Place 5,000 mcg under the tongue daily at 2 PM.     [provider]  docusate sodium (COLACE) 100 MG capsule Take 100 mg by mouth daily.    [provider]  dofetilide (TIKOSYN) 500 MCG capsule Take 1 capsule (500 mcg total) by mouth 2 (two) times daily. 03/26/20   Larey Dresser, MD  dofetilide (TIKOSYN) 500 MCG capsule Take 500 mcg by mouth every 12 (twelve) hours. 05/20/20   [provider]  finasteride (PROSCAR) 5 MG tablet Take 5 mg by mouth daily.    [provider]  furosemide (LASIX) 20 MG tablet Take 1 tablet (20 mg total) by mouth daily. 03/26/20   Larey Dresser, MD  Glucosamine-Chondroitin (COSAMIN DS PO) Take 1 tablet by mouth 2 (two) times daily.    [provider]  loratadine (CLARITIN) 10 MG tablet Take 10 mg by mouth daily as needed for allergies.     [provider]  losartan (COZAAR) 25 MG tablet Take 1 tablet (25 mg total) by mouth daily. 03/26/20   Larey Dresser, MD  losartan (COZAAR) 25 MG tablet Take 25 mg by mouth daily. 04/06/20   [provider]  metoprolol succinate (TOPROL-XL) 50 MG 24 hr tablet Take 75 mg (1.5 tabs) by mouth twice daily. Take with or immediately following a meal. 05/22/20   Larey Dresser, MD  metoprolol succinate (TOPROL-XL) 50 MG 24 hr tablet Take 50 mg by mouth every 12 (twelve) hours. 05/31/20   [provider]  Multiple Vitamins-Minerals (MULTIVITAL-M) TABS Take 1 tablet by mouth daily.     [provider]  potassium chloride SA (KLOR-CON) 20 MEQ tablet Take 1 tablet (20 mEq total) by mouth daily. 03/26/20   Larey Dresser, MD  potassium chloride SA (KLOR-CON) 20 MEQ tablet Take 20 mEq by mouth  daily. 04/22/20   [provider]  Probiotic Product (PROBIOTIC FORMULA PO) Take 1 capsule by mouth 2 (two) times daily.     [provider]  tamsulosin (FLOMAX) 0.4 MG CAPS capsule Take 0.4 mg by mouth 2 (two) times daily. 09/14/18   [provider]  tamsulosin (FLOMAX) 0.4 MG CAPS capsule Take 0.8 mg by mouth every 12 (twelve) hours. 07/07/20   [provider]  warfarin (COUMADIN) 5 MG tablet As directed 03/26/20   Larey Dresser, MD  warfarin (COUMADIN) 5 MG tablet Take 5 mg by mouth daily. 07/22/20   [provider]     Critical care time: 52 min      CRITICAL CARE Performed by: Cristal Generous   Total critical care time: 52 minutes  Critical care time was exclusive of separately billable procedures and treating other patients.  Critical care was necessary to treat or prevent imminent or life-threatening deterioration.  Critical care was time spent personally by me on the following activities: development of treatment plan with patient and/or surrogate as well as nursing, discussions with consultants, evaluation of patient's response to treatment, examination of patient, obtaining history from patient or surrogate, ordering and performing treatments and interventions, ordering and review of laboratory studies, ordering and review of radiographic studies, pulse oximetry and re-evaluation of patient's condition.  Eliseo Gum MSN, AGACNP-BC  [place] for pager  08/07/2020, 2:03 PM

## 2020-08-07 NOTE — Progress Notes (Signed)
Physical Therapy Note  Patient Details  Name: [name] [name] MRN: [id number] Date of Birth: [birth year] Today's Date: 08/07/2020  Attempted to treat pt for 60 min session, but pt out for testing and possible medical change per staff.   Missed 60 min.     Ladoris Gene 08/07/2020, 10:07 AM

## 2020-08-07 NOTE — Progress Notes (Signed)
Inpatient Rehabilitation Care Coordinator Discharge Note  The overall goal for the admission was met for:   Discharge location: D/c to acute hospital due to medical issues.   Length of Stay: 6 days.   Discharge activity level: Mod A  Home/community participation: Yes. Limited  Services provided included: MD, RD, PT, OT, SLP, RN, CM, TR, Pharmacy, Neuropsych and SW  Financial Services: Medicare and Private Insurance: [place] offered to/list presented to:N/A  Follow-up services arranged: N/A  Comments (or additional information):  Patient/Family verbalized understanding of follow-up arrangements: Yes  Individual responsible for coordination of the follow-up plan: contact pt wife Herbert Pun [phone]  Confirmed correct DME delivered: Rana Snare 08/07/2020    Rana Snare

## 2020-08-07 NOTE — Hospital Course (Signed)
6/3  AMS found to have large R MCA CVA taken to [facility] and admitted to neuro ICU following procedure, remains intubated. WBC 17, LLL opacities -- started Unasyn

## 2020-08-07 NOTE — Transfer of Care (Signed)
Immediate Anesthesia Transfer of Care Note  Patient: [name] [name]  Procedure(s) Performed: IR WITH ANESTHESIA (N/A )  Patient Location: PACU and ICU  Anesthesia Type:General  Level of Consciousness: sedated and Patient remains intubated per anesthesia plan  Airway & Oxygen Therapy: Patient remains intubated per anesthesia plan and Patient placed on Ventilator (see vital sign flow sheet for setting)  Post-op Assessment: Report given to RN and Post -op Vital signs reviewed and stable  Post vital signs: Reviewed and stable  Last Vitals:  Vitals Value Taken Time  BP 126/82 08/07/20 1209  Temp    Pulse 62 08/07/20 1209  Resp 22 08/07/20 1209  SpO2 100 % 08/07/20 1209    Last Pain:  Vitals:   08/07/20 0513  TempSrc: Oral  PainSc:          Complications: No complications documented.

## 2020-08-07 NOTE — Progress Notes (Signed)
The scanner in 4N30 is not working.  Alerted IT to this.  I was not able to scan the propofol or cleviprex infusions.

## 2020-08-07 NOTE — Anesthesia Procedure Notes (Signed)
Procedure Name: Intubation Date/Time: 08/07/2020 10:20 AM Performed by: Trinna Post., CRNA Pre-anesthesia Checklist: Patient identified, Emergency Drugs available, Suction available, Patient being monitored and Timeout performed Patient Re-evaluated:Patient Re-evaluated prior to induction Oxygen Delivery Method: Circle system utilized Preoxygenation: Pre-oxygenation with 100% oxygen Induction Type: IV induction, Rapid sequence and Cricoid Pressure applied Laryngoscope Size: Mac and 4 Grade View: Grade I Tube type: Oral Tube size: 7.5 mm Number of attempts: 1 Airway Equipment and Method: Stylet Placement Confirmation: ETT inserted through vocal cords under direct vision,  positive ETCO2 and breath sounds checked- equal and bilateral Secured at: 22 cm Tube secured with: Tape Dental Injury: Teeth and Oropharynx as per pre-operative assessment

## 2020-08-07 NOTE — Progress Notes (Signed)
The patient for 4N30 arrived with 3 pairs of shoed, one shirt, and a suitcase that the wife brought in.

## 2020-08-07 NOTE — Anesthesia Preprocedure Evaluation (Signed)
Anesthesia Evaluation  Patient identified by MRN, date of birth, ID bandGeneral Assessment Comment:Non verbal, Acute stroke  Reviewed: Allergy & Precautions, Patient's Chart, lab work & pertinent test results, Unable to perform ROS - Chart review onlyPreop documentation limited or incomplete due to emergent nature of procedure.  Airway        Dental   Pulmonary    breath sounds clear to auscultation       Cardiovascular hypertension, +CHF  + dysrhythmias  Rhythm:Irregular  80 year old male with history of dCHF, AVR/MVR, aortic aneurysm, A flutter/ Afib  s/p multiple CV with recurrence on Tikosyn/Coumadin, CVT who was admitted on 07/27/20  Left ventricle: The cavity size was normal. Wall thickness was  increased in a pattern of mild LVH. Systolic function was normal.  The estimated ejection fraction was in the range of 60% to 65%.  Wall motion was normal; there were no regional wall motion  abnormalities.  - Aortic valve: A bioprosthesis was present.  - Aortic root: The aortic root was moderately dilated.  - Ascending aorta: The ascending aorta was mildly dilated.  - Mitral valve: A bioprosthesis was present.  - Left atrium: The atrium was severely dilated.      Nuclear stress EF: 47%. No wall motion abnormalities  There was no ST segment deviation noted during stress.  Defect 1: There is a small defect of mild severity present in the apex location. No ischemia identified  The study is normal.  This is a low risk study.      Neuro/Psych  Neuromuscular disease CVA    GI/Hepatic negative GI ROS, Neg liver ROS,   Endo/Other  negative endocrine ROS  Renal/GU negative Renal ROS     Musculoskeletal   Abdominal   Peds  Hematology   Anesthesia Other Findings   Reproductive/Obstetrics                             Anesthesia Physical Anesthesia Plan  ASA: IV and emergent  Anesthesia  Plan: General   Post-op Pain Management:    Induction: Intravenous, Rapid sequence and Cricoid pressure planned  PONV Risk Score and Plan: 2 and Ondansetron and Treatment may vary due to age or medical condition  Airway Management Planned: Oral ETT  Additional Equipment: Arterial line  Intra-op Plan:   Post-operative Plan: Post-operative intubation/ventilation  Informed Consent:     History available from chart only and Only emergency history available  Plan Discussed with: CRNA  Anesthesia Plan Comments:         Anesthesia Quick Evaluation

## 2020-08-07 NOTE — Progress Notes (Signed)
Pharmacy Antibiotic Note  [name] [name] is a 80 y.o. male admitted on 08/07/2020 with acute CVA and concern for aspiration. Pharmacy has been consulted for Unasyn dosing.  Plan: - Start Unasyn 3g IV every 6 hours - Will continue to follow renal function, culture results, LOT, and antibiotic de-escalation plans      Temp (24hrs), Avg:97.6 F (36.4 C), Min:97.5 F (36.4 C), Max:97.6 F (36.4 C)  Recent Labs  Lab 08/01/20 0610 08/03/20 0428 08/07/20 0854  WBC 13.4*  --  17.7*  CREATININE 0.74 0.81 0.92    Estimated Creatinine Clearance: 74.3 mL/min (by C-G formula based on SCr of 0.92 mg/dL).    Allergies  Allergen Reactions  . Naproxen Hives  . No Healthtouch Food Allergies Other (See Comments)    Scallops - distress, nausea and vomitting    Thank you for allowing pharmacy to be a part of this patient's care.  Alycia Rossetti, PharmD, BCPS Clinical Pharmacist Clinical phone for 08/07/2020: [phone] 08/07/2020 2:28 PM   **Pharmacist phone directory can now be found on [email or web address] ([email or web address]).  Listed under [place].

## 2020-08-07 NOTE — Progress Notes (Signed)
Occupational Therapy Note  Patient Details  Name: [name] [name] MRN: [id number] Date of Birth: Feb 28, 1941  Today's Date: 08/07/2020 OT Individual Time   Attempted to see pt for 30 min OT session, however not available secondary to being out of the room for procedure.  Also note medical change as well this am per chart and other therapists.    Shelby Peltz OTR/L 08/07/2020, 9:44 AM

## 2020-08-07 NOTE — Progress Notes (Signed)
Anesthesia present for case 

## 2020-08-07 NOTE — Progress Notes (Signed)
  Echocardiogram 2D Echocardiogram has been performed with Definity.  [name] [name] 08/07/2020, 5:37 PM

## 2020-08-07 NOTE — Progress Notes (Signed)
Echo was attempted at 2:20, patient moving to MRI.   [name] [name]

## 2020-08-08 ENCOUNTER — Inpatient Hospital Stay (HOSPITAL_COMMUNITY): Payer: Medicare Other

## 2020-08-08 ENCOUNTER — Other Ambulatory Visit: Payer: Self-pay

## 2020-08-08 DIAGNOSIS — I639 Cerebral infarction, unspecified: Secondary | ICD-10-CM

## 2020-08-08 LAB — BASIC METABOLIC PANEL
Anion gap: 8 (ref 5–15)
BUN: 25 mg/dL — ABNORMAL HIGH (ref 8–23)
CO2: 24 mmol/L (ref 22–32)
Calcium: 8.1 mg/dL — ABNORMAL LOW (ref 8.9–10.3)
Chloride: 102 mmol/L (ref 98–111)
Creatinine, Ser: 0.93 mg/dL (ref 0.61–1.24)
GFR, Estimated: >60 mL/min (ref >60)
Glucose, Bld: 114 mg/dL — ABNORMAL HIGH (ref 70–99)
Potassium: 3.8 mmol/L (ref 3.5–5.1)
Sodium: 134 mmol/L — ABNORMAL LOW (ref 135–145)

## 2020-08-08 LAB — MAGNESIUM
Magnesium: 2 mg/dL (ref 1.7–2.4)
Magnesium: 2.1 mg/dL (ref 1.7–2.4)
Magnesium: 1.9 mg/dL (ref 1.7–2.4)

## 2020-08-08 LAB — LIPID PANEL
Cholesterol: 184 mg/dL (ref 0–200)
Cholesterol: 119 mg/dL (ref 0–200)
HDL: 36 mg/dL — ABNORMAL LOW (ref >40)
HDL: 42 mg/dL (ref >40)
LDL Cholesterol: 106 mg/dL — ABNORMAL HIGH (ref 0–99)
LDL Cholesterol: 56 mg/dL (ref 0–99)
Total CHOL/HDL Ratio: 5.1 RATIO
Total CHOL/HDL Ratio: 2.8 RATIO
Triglycerides: 208 mg/dL — ABNORMAL HIGH (ref <150)
Triglycerides: 105 mg/dL (ref <150)
VLDL: 42 mg/dL — ABNORMAL HIGH (ref 0–40)
VLDL: 21 mg/dL (ref 0–40)

## 2020-08-08 LAB — PHOSPHORUS
Phosphorus: 3.3 mg/dL (ref 2.5–4.6)
Phosphorus: 3.2 mg/dL (ref 2.5–4.6)

## 2020-08-08 LAB — CBC WITH DIFFERENTIAL/PLATELET
Abs Immature Granulocytes: 0.17 10*3/uL — ABNORMAL HIGH (ref 0.00–0.07)
Basophils Absolute: 0 10*3/uL (ref 0.0–0.1)
Basophils Relative: 0 %
Eosinophils Absolute: 0.1 10*3/uL (ref 0.0–0.5)
Eosinophils Relative: 1 %
HCT: 36.7 % — ABNORMAL LOW (ref 39.0–52.0)
Hemoglobin: 12.3 g/dL — ABNORMAL LOW (ref 13.0–17.0)
Immature Granulocytes: 1 %
Lymphocytes Relative: 10 %
Lymphs Abs: 1.8 10*3/uL (ref 0.7–4.0)
MCH: 31.5 pg (ref 26.0–34.0)
MCHC: 33.5 g/dL (ref 30.0–36.0)
MCV: 94.1 fL (ref 80.0–100.0)
Monocytes Absolute: 1.9 10*3/uL — ABNORMAL HIGH (ref 0.1–1.0)
Monocytes Relative: 11 %
Neutro Abs: 13.5 10*3/uL — ABNORMAL HIGH (ref 1.7–7.7)
Neutrophils Relative %: 77 %
Platelets: 254 10*3/uL (ref 150–400)
RBC: 3.9 MIL/uL — ABNORMAL LOW (ref 4.22–5.81)
RDW: 14.2 % (ref 11.5–15.5)
WBC: 17.5 10*3/uL — ABNORMAL HIGH (ref 4.0–10.5)
nRBC: 0 % (ref 0.0–0.2)

## 2020-08-08 LAB — GLUCOSE, CAPILLARY
Glucose-Capillary: 105 mg/dL — ABNORMAL HIGH (ref 70–99)
Glucose-Capillary: 107 mg/dL — ABNORMAL HIGH (ref 70–99)
Glucose-Capillary: 109 mg/dL — ABNORMAL HIGH (ref 70–99)
Glucose-Capillary: 111 mg/dL — ABNORMAL HIGH (ref 70–99)
Glucose-Capillary: 115 mg/dL — ABNORMAL HIGH (ref 70–99)
Glucose-Capillary: 123 mg/dL — ABNORMAL HIGH (ref 70–99)
Glucose-Capillary: 156 mg/dL — ABNORMAL HIGH (ref 70–99)

## 2020-08-08 LAB — TRIGLYCERIDES: Triglycerides: 212 mg/dL — ABNORMAL HIGH (ref <150)

## 2020-08-08 LAB — PROCALCITONIN: Procalcitonin: <0.1 ng/mL

## 2020-08-08 MED ORDER — CHLORHEXIDINE GLUCONATE CLOTH 2 % EX PADS
6.0000 | MEDICATED_PAD | Freq: Every day | CUTANEOUS | Status: DC
  Administered 2020-08-08 – 2020-08-15 (×8): 6 via TOPICAL

## 2020-08-08 MED ORDER — PROSOURCE TF PO LIQD
45.0000 mL | Freq: Two times a day (BID) | ORAL | Status: DC
  Administered 2020-08-08 – 2020-08-10 (×5): 45 mL
  Filled 2020-08-08 (×4): qty 45

## 2020-08-08 MED ORDER — POTASSIUM CHLORIDE 10 MEQ/100ML IV SOLN
10.0000 meq | INTRAVENOUS | Status: AC
  Administered 2020-08-08 (×3): 10 meq via INTRAVENOUS
  Filled 2020-08-08 (×3): qty 100

## 2020-08-08 MED ORDER — VITAL HIGH PROTEIN PO LIQD
1000.0000 mL | ORAL | Status: DC
  Administered 2020-08-08 – 2020-08-10 (×3): 1000 mL

## 2020-08-08 NOTE — Progress Notes (Signed)
PT Cancellation Note  Patient Details Name: ED MANDICH MRN: [id number] DOB: 02-May-1940   Cancelled Treatment:    Reason Eval/Treat Not Completed: Medical issues which prohibited therapy Remains intubated following NIR.  Wyona Almas, PT, DPT Acute Rehabilitation Services Pager [phone] Office [place] 08/08/2020, 8:00 AM

## 2020-08-08 NOTE — Progress Notes (Signed)
OT Cancellation Note  Patient Details Name: XAVIER MUNGER MRN: [id number] DOB: 05-Apr-1940   Cancelled Treatment:    Reason Eval/Treat Not Completed: Patient not medically ready (Remains intubated following NIR. OT to continue to follow.)    Jefferey Pica, OTR/L Acute Rehabilitation Services Pager: [phone] Office: [phone]   Anvika Gashi C 08/08/2020, 8:16 AM

## 2020-08-08 NOTE — Progress Notes (Signed)
Brief Nutrition Note  Consult received for enteral/tube feeding initiation and management.  Adult Enteral Nutrition Protocol initiated. Full assessment to follow.  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 Pt meets criteria for overweight based on current BMI.  Labs:  Recent Labs  Lab 08/03/20 0428 08/04/20 0455 08/05/20 0503 08/07/20 0854 08/07/20 1337 08/08/20 0625  NA 134*  --   --  134* 136 134*  K 4.0 4.3 4.4 4.1 4.0 3.8  CL 101  --   --  104  --  102  CO2 27  --   --  24  --  24  BUN 24*  --   --  29*  --  25*  CREATININE 0.81  --   --  0.92  --  0.93  CALCIUM 8.8*  --   --  8.8*  --  8.1*  MG 2.0 2.2 2.1  --   --  2.0  GLUCOSE 151*  --   --  116*  --  114*    Clayton Bibles, MS, RD, LDN Inpatient Clinical Dietitian Contact information available via Amion

## 2020-08-08 NOTE — Progress Notes (Signed)
SLP Cancellation Note  Patient Details Name: [name] [name] MRN: [id number] DOB: [birth year]   Cancelled treatment:        Pt remains intubated - will await readiness.  Jamayia Croker L. Tivis Ringer, [place] CCC/SLP Acute Rehabilitation Services Office number [phone] Pager [phone]    Juan Quam Laurice 08/08/2020, 8:27 AM

## 2020-08-08 NOTE — Progress Notes (Signed)
Venous duplex lower ext  has been completed. Refer to [facility] under chart review to view preliminary results.   08/08/2020  1:41 PM Tashana Haberl, Bonnye Fava

## 2020-08-08 NOTE — Progress Notes (Signed)
INTERVAL HISTORY His wife is at the bedside.  No change in neurologic status. Pt is still intubated. No following commands on Propofol.   Vitals:   08/08/20 0700 08/08/20 0734 08/08/20 0800 08/08/20 0900  BP: 126/90 126/90 (!) 140/97 130/81  Pulse: 74 72 73 74  Resp: 13 16 15 14   Temp:   98.9 F (37.2 C)   TempSrc:   Axillary   SpO2: 98% 98% 98% 98%  Weight:      Height:       CBC:  Recent Labs  Lab 08/07/20 0854 08/07/20 1337 08/08/20 0625  WBC 17.7*  --  17.5*  NEUTROABS 14.4*  --  13.5*  HGB 13.1 12.2* 12.3*  HCT 38.3* 36.0* 36.7*  MCV 92.1  --  94.1  PLT 311  --  625   Basic Metabolic Panel:  Recent Labs  Lab 08/05/20 0503 08/07/20 0854 08/07/20 1337 08/08/20 0625  NA  --  134* 136 134*  K 4.4 4.1 4.0 3.8  CL  --  104  --  102  CO2  --  24  --  24  GLUCOSE  --  116*  --  114*  BUN  --  29*  --  25*  CREATININE  --  0.92  --  0.93  CALCIUM  --  8.8*  --  8.1*  MG 2.1  --   --  2.0   Lipid Panel:  Recent Labs  Lab 08/08/20 0625  CHOL 119  TRIG 105  HDL 42  CHOLHDL 2.8  VLDL 21  LDLCALC 56   HgbA1c: No results for input(s): HGBA1C in the last 168 hours. Urine Drug Screen: No results for input(s): LABOPIA, COCAINSCRNUR, LABBENZ, AMPHETMU, THCU, LABBARB in the last 168 hours.  Alcohol Level No results for input(s): ETH in the last 168 hours.  IMAGING past 24 hours MR ANGIO HEAD WO CONTRAST  Result Date: 08/07/2020 CLINICAL DATA:  Follow-up stroke.  Right MCA territory infarction EXAM: MRI HEAD WITHOUT CONTRAST MRA HEAD WITHOUT CONTRAST TECHNIQUE: Multiplanar, multi-echo pulse sequences of the brain and surrounding structures were acquired without intravenous contrast. Angiographic images of the Circle of Willis were acquired using MRA technique without intravenous contrast. COMPARISON: No pertinent prior exam. COMPARISON:  CT studies earlier today. FINDINGS: MRI HEAD FINDINGS Brain: Diffusion imaging shows confluent acute infarction within the right  middle cerebral artery territory with some sparing of the anterior insula and frontal operculum and only spotty involvement the caudate and putamen. Mild swelling of the infarction with minimal petechial blood products but no frank hematoma. No focal abnormality affects the brainstem or cerebellum. There has been right parietal craniotomy for evacuation of a large right subdural hematoma. Small amount subdural blood does persist, maximal thickness in the frontal region 8 mm. Small amount of subdural blood also in the left parietal and occipital region, maximal thickness 1 cm without significant mass effect. Right-to-left midline shift presently of 7.5 mm. Mild chronic small-vessel ischemic changes of the white matter. Old infarction at the left parietooccipital junction with chronic volume loss. Small amount of hemorrhagic contusion in this region. Small amount of blood dependent within the occipital horns of the lateral ventricles, without evidence of obstructive hydrocephalus. Vascular: Major vessels at the base of the brain show flow. Skull and upper cervical spine: No primary calvarial finding. Sinuses/Orbits: Rhinosinusitis of the right maxillary sinus. Orbits negative. Other: Left parietal scalp bruising. MRA HEAD FINDINGS Anterior circulation: Both internal carotid arteries widely patent through the skull base  and siphon regions. The anterior and middle cerebral vessels are patent. No evidence of large or medium vessel occlusion in the right MCA territory. Probable luxury perfusion throughout the right MCA territory. Azygos anterior cerebral artery incidentally noted. Posterior circulation: Both vertebral arteries widely patent to the basilar. No basilar stenosis. Posterior circulation branch vessels are patent. Anatomic variants: None other significant. IMPRESSION: Confluent acute infarction throughout the majority of the right middle cerebral artery territory, with some sparing of the anterior insula and  frontal operculum. Spotty involvement of the right caudate and putamen. Swelling and petechial blood products but no frank hematoma. Mass effect with right-to-left shift of 7.5 mm. Recent right parietal craniectomy for evacuation of subdural hematoma. Residual subdural blood and fluid on the right, maximal thickness 8 mm. Residual subdural blood in the left parieto-occipital region, maximal thickness 1 cm. Small amount of blood dependent in the occipital horns of the lateral ventricles without evidence of change in ventricular size. Small hemorrhagic contusion in the region of subacute infarction in the right parietooccipital junction. MR angiography negative for large or medium vessel occlusion presently. Luxury perfusion in the MCA branches. Electronically Signed   By: Nelson Chimes M.D.   On: 08/07/2020 16:40   MR BRAIN WO CONTRAST  Result Date: 08/07/2020 CLINICAL DATA:  Follow-up stroke.  Right MCA territory infarction EXAM: MRI HEAD WITHOUT CONTRAST MRA HEAD WITHOUT CONTRAST TECHNIQUE: Multiplanar, multi-echo pulse sequences of the brain and surrounding structures were acquired without intravenous contrast. Angiographic images of the Circle of Willis were acquired using MRA technique without intravenous contrast. COMPARISON: No pertinent prior exam. COMPARISON:  CT studies earlier today. FINDINGS: MRI HEAD FINDINGS Brain: Diffusion imaging shows confluent acute infarction within the right middle cerebral artery territory with some sparing of the anterior insula and frontal operculum and only spotty involvement the caudate and putamen. Mild swelling of the infarction with minimal petechial blood products but no frank hematoma. No focal abnormality affects the brainstem or cerebellum. There has been right parietal craniotomy for evacuation of a large right subdural hematoma. Small amount subdural blood does persist, maximal thickness in the frontal region 8 mm. Small amount of subdural blood also in the left  parietal and occipital region, maximal thickness 1 cm without significant mass effect. Right-to-left midline shift presently of 7.5 mm. Mild chronic small-vessel ischemic changes of the white matter. Old infarction at the left parietooccipital junction with chronic volume loss. Small amount of hemorrhagic contusion in this region. Small amount of blood dependent within the occipital horns of the lateral ventricles, without evidence of obstructive hydrocephalus. Vascular: Major vessels at the base of the brain show flow. Skull and upper cervical spine: No primary calvarial finding. Sinuses/Orbits: Rhinosinusitis of the right maxillary sinus. Orbits negative. Other: Left parietal scalp bruising. MRA HEAD FINDINGS Anterior circulation: Both internal carotid arteries widely patent through the skull base and siphon regions. The anterior and middle cerebral vessels are patent. No evidence of large or medium vessel occlusion in the right MCA territory. Probable luxury perfusion throughout the right MCA territory. Azygos anterior cerebral artery incidentally noted. Posterior circulation: Both vertebral arteries widely patent to the basilar. No basilar stenosis. Posterior circulation branch vessels are patent. Anatomic variants: None other significant. IMPRESSION: Confluent acute infarction throughout the majority of the right middle cerebral artery territory, with some sparing of the anterior insula and frontal operculum. Spotty involvement of the right caudate and putamen. Swelling and petechial blood products but no frank hematoma. Mass effect with right-to-left shift of 7.5  mm. Recent right parietal craniectomy for evacuation of subdural hematoma. Residual subdural blood and fluid on the right, maximal thickness 8 mm. Residual subdural blood in the left parieto-occipital region, maximal thickness 1 cm. Small amount of blood dependent in the occipital horns of the lateral ventricles without evidence of change in  ventricular size. Small hemorrhagic contusion in the region of subacute infarction in the right parietooccipital junction. MR angiography negative for large or medium vessel occlusion presently. Luxury perfusion in the MCA branches. Electronically Signed   By: Nelson Chimes M.D.   On: 08/07/2020 16:40   Portable Chest x-ray  Result Date: 08/07/2020 CLINICAL DATA:  Status post ET tube placement. EXAM: PORTABLE CHEST 1 VIEW COMPARISON:  Jul 27, 2020. FINDINGS: Endotracheal tube tip is at the level of the clavicular heads. Gastric tube courses below the diaphragm with the tip outside the field of view. The side port is approximately above the gastroesophageal junction. Similar left basilar opacities. No visible pneumothorax or large pleural effusion on this single AP semi erect radiograph. Similar left basilar opacities. Cardiomediastinal silhouette is similar. 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 Aortic atherosclerosis. IMPRESSION: 1. Endotracheal tube tip is at the level of the clavicular heads. 2. Gastric tube courses below the diaphragm with the tip outside the field of view and the side port 3 cm above the gastroesophageal junction. Recommend advancement if intragastric position of the side port is desired. 3. Similar left basilar opacities, which could represent atelectasis or pneumonia. Electronically Signed   By: Margaretha Sheffield MD   On: 08/07/2020 13:02   DG Abd Portable 1V  Result Date: 08/07/2020 CLINICAL DATA:  Gastric tube placement EXAM: PORTABLE ABDOMEN - 1 VIEW COMPARISON:  None. FINDINGS: NG tube in the stomach with the tip in the lateral body of the stomach. Normal bowel gas pattern. Contrast in the renal collecting system from proceeding angiography. Normal bowel gas pattern. IMPRESSION: NG tube in the stomach. Electronically Signed   By: Franchot Gallo M.D.   On: 08/07/2020 14:10   ECHOCARDIOGRAM COMPLETE  Result Date: 08/07/2020    ECHOCARDIOGRAM REPORT   Patient Name:    Jaymien ROBI DEWOLFE Date of Exam: 08/07/2020 Medical Rec #:  [id number]     Height:       68.0 in Accession #:    [id number]    Weight:       204.4 lb Date of Birth:  [birth year]     BSA:          2.063 m Patient Age:    42 years      BP:           116/80 mmHg Patient Gender: M             HR:           70 bpm. Exam Location:  Inpatient Procedure: 2D Echo, Cardiac Doppler, Color Doppler and Intracardiac            Opacification Agent Indications:    Stroke  History:        Patient has prior history of Echocardiogram examinations, most                 recent 02/22/2018. 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 Hx rheumatic heart                 disease. S/p AVR. S/p MVR.  Sonographer:    Clayton Lefort RDCS (AE) Referring Phys: [id number] Lorenza Chick  Sonographer Comments: Suboptimal apical window. IMPRESSIONS  1. Left ventricular ejection fraction, by estimation, is 60 to 65%. The left ventricle has normal function. The left ventricle has no regional wall motion abnormalities. There is severe concentric left ventricular hypertrophy. Diastolic function indeterminant due to mitral valve repair.  2. Right ventricular systolic function is normal. The right ventricular size is normal.  3. Left atrial size was severely dilated.  4. Right atrial size was mildly dilated.  5. The mitral valve has been repaired/replaced. A bioprosthetic mitral valve is present. The valve appears well seated with normal function by doppler interrogation. Mean gradient 59mmHg at HR 67bpm. There is no mitral valve regurgitation.  6. The aortic valve has been repaired/replaced. A bioprosthetic aortic valve is present and appears well seated with normal function by doppler interrogation. Mean gradient 107mmHg, peak gradient 80mmHg, Vmax 2.37, DI 0.4. Trace aortic regurgitation visualized.  7. Aortic dilatation noted. 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 There is  moderate-to-severe dilatation of the ascending aorta, measuring 49 mm. Comparison(s): Compared to prior TTE in our system (2019), the aortic root now measures 56mm (previously 40mm) and the ascending aorta now measures 83mm (previously 68mm). Notably, the patient has had subsequent TTEs after 2019 at [facility] clinic. FINDINGS  Left Ventricle: Left ventricular ejection fraction, by estimation, is 60 to 65%. The left ventricle has normal function. The left ventricle has no regional wall motion abnormalities. Definity contrast agent was given IV to delineate the left ventricular  endocardial borders. The left ventricular internal cavity size was normal in size. There is severe concentric left ventricular hypertrophy. Diastolic function indeterminant due to mitral valve repair. Right Ventricle: The right ventricular size is normal. No increase in right ventricular wall thickness. Right ventricular systolic function is normal. Left Atrium: Left atrial size was severely dilated. Right Atrium: Right atrial size was mildly dilated. Pericardium: There is no evidence of pericardial effusion. Mitral Valve: The mitral valve has been repaired/replaced. A bioprosthetic mitral valve is present. The valve appears well seated with normal function by doppler interrogation. Mean gradient 10mmHg at HR 67bpm. There is no mitral valve regurgitation. Mean  gradient 36mmHg at HR 67bpm. No mitral regurgitation mitral valve regurgitation. MV peak gradient, 12.0 mmHg. The mean mitral valve gradient is 4.5 mmHg. Tricuspid Valve: The tricuspid valve is normal in structure. Tricuspid valve regurgitation is trivial. Aortic Valve: The aortic valve has been repaired/replaced. Aortic valve regurgitation is trivial. A bioprosthetic aortic valve is present and appears well seated with normal function by doppler interrogation.Aortic valve mean gradient measures 12.0 mmHg.  Aortic valve peak gradient measures 21.0 mmHg. Aortic valve area, by VTI measures  1.67 cm. Pulmonic Valve: The pulmonic valve was normal in structure. Pulmonic valve regurgitation is trivial. Aorta: Aortic dilatation noted. 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 Venous: IVC assessment for right atrial pressure unable to be performed due to mechanical ventilation. IAS/Shunts: No atrial level shunt detected by color flow Doppler.  LEFT VENTRICLE PLAX 2D LVIDd:         3.90 cm LVIDs:         2.70 cm LV PW:         1.80 cm LV IVS:        2.00 cm LVOT diam:     2.40 cm LV  SV:         76 LV SV Index:   37 LVOT Area:     4.52 cm  RIGHT VENTRICLE             IVC RV Basal diam:  3.40 cm     IVC diam: 2.00 cm RV S prime:     12.50 cm/s TAPSE (M-mode): 1.5 cm LEFT ATRIUM            Index       RIGHT ATRIUM           Index LA diam:      5.00 cm  2.42 cm/m  RA Area:     22.30 cm LA Vol (A2C): 88.8 ml  43.05 ml/m RA Volume:   68.00 ml  32.97 ml/m LA Vol (A4C): 126.0 ml 61.09 ml/m  AORTIC VALVE AV Area (Vmax):    1.83 cm AV Area (Vmean):   1.80 cm AV Area (VTI):     1.67 cm AV Vmax:           229.25 cm/s AV Vmean:          162.250 cm/s AV VTI:            0.453 m AV Peak Grad:      21.0 mmHg AV Mean Grad:      12.0 mmHg LVOT Vmax:         92.77 cm/s LVOT Vmean:        64.667 cm/s LVOT VTI:          0.168 m LVOT/AV VTI ratio: 0.37  AORTA Ao Root diam: 4.90 cm Ao Asc diam:  4.90 cm MITRAL VALVE            TRICUSPID VALVE MV Area VTI:  1.64 cm  TR Peak grad:   15.2 mmHg MV Peak grad: 12.0 mmHg TR Vmax:        195.00 cm/s MV Mean grad: 4.5 mmHg MV Vmax:      1.74 m/s  SHUNTS MV Vmean:     97.3 cm/s Systemic VTI:  0.17 m                         Systemic Diam: 2.40 cm Gwyndolyn Kaufman MD Electronically signed by Gwyndolyn Kaufman MD Signature Date/Time: 08/07/2020/6:59:58 PM    Final     PHYSICAL EXAM GENERAL: intubated HEENT: Head is atraumatic, normocephalic. Eyes: PERRL.  LUNGS: deep  breathing sounds CARDIOVASCULAR: tachycardic  ABDOMEN: Bowel sounds present.  Ext : normal pulse distally  NEUROLOGICAL EXAM MS intubated, on propofol, not following commands or answering questions.   CN 2-12:  R gaze deviation, L facial asymmetry and weakness noted, no facial sensory deficits  Motor: : L sided weakness noted  - No Abnormal movements identified.   Sensory:  Unable to assess  -Toes: equivocal -No clonus;   Coordination:  Unable to assess  Gait was not tested considering pt's condition     ASSESSMENT/PLAN  1. 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 S/P thrombectomy with TICI2C  Etiology   Pt was deemed not a candidate for TPA being outside the TPA window. Not an endovascular candidate with no evidence of LVO.   Vascular risk factors:  HTN, DM, afib HLD   CT head  hypodensity right MCA vessel sign, aspects 5, R SDH unchanged, decreased interhemispheric [facility] SAH. Stable extraaxial hemorrhage over the L parietal lobe measuring 10 mm. 4 mm left midline shift  CTA head & neck RICA occlusion shortely beyond origin with R M2 occlusion. Moderate L vert stenosis. ascending thoracic aorta measures up to 4 cm in diameter, severe right PCA P3/P4 branch stenosis.   CT perfusion   2D Echo lvef 60-65% SEVERELY dilated LA  LDL 56  HgbA1c 6.2   Ok for Lovenox for VTE prophylaxis Diet Order            Diet NPO time specified  Diet effective now                   ASA 300 mg PR for now.   Therapy recommendations:  Pending  Disposition:  pending  Hypertension . Maintain SBP <180 for now. Avoid hypotension.  [name] Restart Oral meds when pt is able to get oral meds at half doses.  . Long-term BP goal <!40/90  Hyperlipidemia  Home meds:   Lipitor 80 mg qhs  LDL 56, goal < 70  Restart Liptior when able to swallow  Continue statin at discharge  Diabetes type II   HgbA1c 6.2, goal < 7.0  Maintain BG 80-180  CBGs Recent Labs    08/08/20 0021 08/08/20 0402 08/08/20 0825  GLUCAP 115* 107* 109*      SSI    Cerebral edema -Close neuro monitoring - Repeat CT head in AM   Dysarthria Dysphagia following cerebral infarction  -NPO until cleared by speech -ST -Advance diet as tolerated -May need PEG  Hemiplegia and hemiparesis following cerebral infarction affecting left non-dominant side  -PT/OT -PM&R consult  RESP Acute Respiratory Failure  -Appreciate vent management per ICU -wean when able   Paroxysmal atrial fibrillation -Rate control, continue metoprolol and home dofetilide -Hold anticoagulation for now pending clinical course  Thoracic aortic aneurysm -Known problem is being followed in [place], recent imaging in May -Follow-up outside records  HEME No active anemia -Monitor -transfuse for hgb < 7   GI/GU BPH -Gentle hydration -avoid nephrotoxic agents -Continue home medications  Fluid/Electrolyte Disorders -Monitor -Replete as needed  ID Possible   PNA, WBC 17.5, CXR with L basilar consolidation  -On Unasyn  -NPO -Monitor  Nutrition N.p.o. overnight -diet consult  Prophylaxis DVT: Start subcutaneous prophylactic  SCDs for now GI: Pantoprazole Bowel: Senna as needed  Diet: NPO until cleared by speech  Code Status: Full Code   Hospital day # 1   To contact Stroke Continuity provider, please refer to [email or web address] After hours, contact General Neurology

## 2020-08-08 NOTE — Progress Notes (Signed)
Referring Physician(s): Code stroke- Bhagat, Srishti L (neurology)  Supervising Physician: Luanne Bras  Patient Status:  [facility] - In-pt  Chief Complaint:  History of acute CVA s/p cerebral arteriogram with emergent mechanical thrombectomy of right ICA terminus occlusion achieving a TICI 2c revascularization via right femoral approach 08/07/2020 by Dr. Estanislado Pandy.  Subjective:  Patient laying in bed intubated/sedated (weaning). Wife at bedside. Moving all extremities spontaneously. Wiggles toes on command. Right femoral puncture site c/d/i.   Allergies: Naproxen and No healthtouch food allergies  Medications: Prior to Admission medications   Medication Sig Start Date End Date Taking? Authorizing Provider  atorvastatin (LIPITOR) 80 MG tablet TAKE 1 TABLET (80 MG TOTAL) BY MOUTH EVERY EVENING. 04/07/20   Larey Dresser, MD  atorvastatin (LIPITOR) 80 MG tablet Take 1 tablet by mouth daily. 06/29/20   [provider]  clobetasol cream (TEMOVATE) 1.61 % Apply 1 application topically daily as needed (for skin irritation).  05/20/14   [provider]  Coenzyme Q10 (CO Q-10) 300 MG CAPS Take 300 mg by mouth daily.    [provider]  COVID-19 mRNA vaccine, Moderna, 100 MCG/0.5ML injection Inject into the muscle. 06/26/20   Carlyle Basques, MD  Cyanocobalamin (VITAMIN B-12) 5000 MCG SUBL Place 5,000 mcg under the tongue daily at 2 PM.     [provider]  docusate sodium (COLACE) 100 MG capsule Take 100 mg by mouth daily.    [provider]  dofetilide (TIKOSYN) 500 MCG capsule Take 1 capsule (500 mcg total) by mouth 2 (two) times daily. 03/26/20   Larey Dresser, MD  dofetilide (TIKOSYN) 500 MCG capsule Take 500 mcg by mouth every 12 (twelve) hours. 05/20/20   [provider]  finasteride (PROSCAR) 5 MG tablet Take 5 mg by mouth daily.    [provider]  furosemide (LASIX) 20 MG tablet Take 1 tablet (20 mg total) by mouth  daily. 03/26/20   Larey Dresser, MD  Glucosamine-Chondroitin (COSAMIN DS PO) Take 1 tablet by mouth 2 (two) times daily.    [provider]  loratadine (CLARITIN) 10 MG tablet Take 10 mg by mouth daily as needed for allergies.     [provider]  losartan (COZAAR) 25 MG tablet Take 1 tablet (25 mg total) by mouth daily. 03/26/20   Larey Dresser, MD  losartan (COZAAR) 25 MG tablet Take 25 mg by mouth daily. 04/06/20   [provider]  metoprolol succinate (TOPROL-XL) 50 MG 24 hr tablet Take 75 mg (1.5 tabs) by mouth twice daily. Take with or immediately following a meal. 05/22/20   Larey Dresser, MD  metoprolol succinate (TOPROL-XL) 50 MG 24 hr tablet Take 50 mg by mouth every 12 (twelve) hours. 05/31/20   [provider]  Multiple Vitamins-Minerals (MULTIVITAL-M) TABS Take 1 tablet by mouth daily.     [provider]  potassium chloride SA (KLOR-CON) 20 MEQ tablet Take 1 tablet (20 mEq total) by mouth daily. 03/26/20   Larey Dresser, MD  potassium chloride SA (KLOR-CON) 20 MEQ tablet Take 20 mEq by mouth daily. 04/22/20   [provider]  Probiotic Product (PROBIOTIC FORMULA PO) Take 1 capsule by mouth 2 (two) times daily.     [provider]  tamsulosin (FLOMAX) 0.4 MG CAPS capsule Take 0.4 mg by mouth 2 (two) times daily. 09/14/18   [provider]  tamsulosin (FLOMAX) 0.4 MG CAPS capsule Take 0.8 mg by mouth every 12 (twelve) hours. 07/07/20  [provider]  warfarin (COUMADIN) 5 MG tablet As directed 03/26/20   Larey Dresser, MD  warfarin (COUMADIN) 5 MG tablet Take 5 mg by mouth daily. 07/22/20   [provider]     Vital Signs: BP 130/81   Pulse 74   Temp 98.9 F (37.2 C) (Axillary)   Resp 14   Ht 5\' 7"  (1.702 m)   Wt 186 lb 4.6 oz (84.5 kg)   SpO2 98%   BMI 29.18 kg/m   Physical Exam Vitals and nursing note reviewed.  Constitutional:      General: He is not in acute distress.     Comments: Intubated/sedated (weaning).  Pulmonary:     Effort: Pulmonary effort is normal. No respiratory distress.     Comments: Intubated/sedated (weaning). Skin:    General: Skin is warm and dry.     Comments: Right femoral puncture site soft without active bleeding or hematoma.  Neurological:     Comments: Intubated/sedated (weaning). Follows simple commands (wiggles toes on command). Does not open eyes to voice/painful stimuli. PERRL bilaterally. Can spontaneously move all extremities. Distal pulses (DPs) palpable bilaterally.     Imaging: CT HEAD WO CONTRAST  Addendum Date: 08/07/2020   ADDENDUM REPORT: 08/07/2020 09:36 ADDENDUM: These results were called by telephone at the time of interpretation on 08/07/2020 at 9:36 am to provider Dr. Tessa Lerner, who verbally acknowledged these results. Electronically Signed   By: Kellie Simmering DO   On: 08/07/2020 09:36   Result Date: 08/07/2020 CLINICAL DATA:  Mental status change, unknown cause. Additional history provided: Patient nonverbal and not moving left side, last known normal 8 p.m. last night. EXAM: CT HEAD WITHOUT CONTRAST TECHNIQUE: Contiguous axial images were obtained from the base of the skull through the vertex without intravenous contrast. COMPARISON:  Prior head CT examinations 08/03/2020 and earlier. FINDINGS: Brain: New from the prior examination of 08/03/2020, there is a large acute cortical/subcortical infarct within the right middle cerebral artery vascular territory, affecting the right frontal, parietal and temporal lobes as well as right insula and lateral right occipital lobe. Unchanged subdural hemorrhage overlying the right cerebral hemisphere. Continued interval decrease in conspicuity of interhemispheric subdural and subarachnoid hemorrhage. Unchanged size of an extra-axial hemorrhage overlying the left parietal lobe, again measuring up to 10 mm in thickness (remeasured on prior). Persistent trace intraventricular hemorrhage.  No evidence of hydrocephalus at this time. Unchanged mass effect with 4 mm leftward midline shift measured at the level of the septum pellucidum. Redemonstrated chronic left parietal temporal infarct with ex vacuo dilatation of the posterior left lateral ventricle. Persistent although decreased pneumocephalus overlying the right cerebral hemisphere. Vascular: Asymmetric hyperdensity of the M1 and M2 right middle cerebral artery vessels, likely reflecting endoluminal thrombus. Atherosclerotic calcifications. Skull: Right-sided cranioplasty with overlying scalp staples. Sinuses/Orbits: Visualized orbits show no acute finding. Frothy secretions and moderate mucosal thickening within the right maxillary sinus. Trace bilateral ethmoid sinus mucosal thickening. IMPRESSION: 1. 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 Asymmetric hyperdensity of the M1 and M2 right middle cerebral artery vessels, likely reflecting endoluminal thrombus. 2. Unchanged subdural hemorrhage overlying the right cerebral hemisphere. 3. Continued interval decrease in conspicuity of interhemispheric subdural and subarachnoid hemorrhage. 4. Unchanged size of an extra-axial hemorrhage overlying the left parietal lobe, again measuring up to 10 mm in thickness. 5. Persistent trace intraventricular hemorrhage. No evidence of hydrocephalus. 6. Unchanged mass effect with 4 mm leftward  midline shift. 7. Right maxillary sinusitis. Electronically Signed: By: Kellie Simmering DO On: 08/07/2020 09:32   MR ANGIO HEAD WO CONTRAST  Result Date: 08/07/2020 CLINICAL DATA:  Follow-up stroke.  Right MCA territory infarction EXAM: MRI HEAD WITHOUT CONTRAST MRA HEAD WITHOUT CONTRAST TECHNIQUE: Multiplanar, multi-echo pulse sequences of the brain and surrounding structures were acquired without intravenous contrast. Angiographic images of the Circle of Willis were acquired  using MRA technique without intravenous contrast. COMPARISON: No pertinent prior exam. COMPARISON:  CT studies earlier today. FINDINGS: MRI HEAD FINDINGS Brain: Diffusion imaging shows confluent acute infarction within the right middle cerebral artery territory with some sparing of the anterior insula and frontal operculum and only spotty involvement the caudate and putamen. Mild swelling of the infarction with minimal petechial blood products but no frank hematoma. No focal abnormality affects the brainstem or cerebellum. There has been right parietal craniotomy for evacuation of a large right subdural hematoma. Small amount subdural blood does persist, maximal thickness in the frontal region 8 mm. Small amount of subdural blood also in the left parietal and occipital region, maximal thickness 1 cm without significant mass effect. Right-to-left midline shift presently of 7.5 mm. Mild chronic small-vessel ischemic changes of the white matter. Old infarction at the left parietooccipital junction with chronic volume loss. Small amount of hemorrhagic contusion in this region. Small amount of blood dependent within the occipital horns of the lateral ventricles, without evidence of obstructive hydrocephalus. Vascular: Major vessels at the base of the brain show flow. Skull and upper cervical spine: No primary calvarial finding. Sinuses/Orbits: Rhinosinusitis of the right maxillary sinus. Orbits negative. Other: Left parietal scalp bruising. MRA HEAD FINDINGS Anterior circulation: Both internal carotid arteries widely patent through the skull base and siphon regions. The anterior and middle cerebral vessels are patent. No evidence of large or medium vessel occlusion in the right MCA territory. Probable luxury perfusion throughout the right MCA territory. Azygos anterior cerebral artery incidentally noted. Posterior circulation: Both vertebral arteries widely patent to the basilar. No basilar stenosis. Posterior  circulation branch vessels are patent. Anatomic variants: None other significant. IMPRESSION: Confluent acute infarction throughout the majority of the right middle cerebral artery territory, with some sparing of the anterior insula and frontal operculum. Spotty involvement of the right caudate and putamen. Swelling and petechial blood products but no frank hematoma. Mass effect with right-to-left shift of 7.5 mm. Recent right parietal craniectomy for evacuation of subdural hematoma. Residual subdural blood and fluid on the right, maximal thickness 8 mm. Residual subdural blood in the left parieto-occipital region, maximal thickness 1 cm. Small amount of blood dependent in the occipital horns of the lateral ventricles without evidence of change in ventricular size. Small hemorrhagic contusion in the region of subacute infarction in the right parietooccipital junction. MR angiography negative for large or medium vessel occlusion presently. Luxury perfusion in the MCA branches. Electronically Signed   By: Nelson Chimes M.D.   On: 08/07/2020 16:40   MR BRAIN WO CONTRAST  Result Date: 08/07/2020 CLINICAL DATA:  Follow-up stroke.  Right MCA territory infarction EXAM: MRI HEAD WITHOUT CONTRAST MRA HEAD WITHOUT CONTRAST TECHNIQUE: Multiplanar, multi-echo pulse sequences of the brain and surrounding structures were acquired without intravenous contrast. Angiographic images of the Circle of Willis were acquired using MRA technique without intravenous contrast. COMPARISON: No pertinent prior exam. COMPARISON:  CT studies earlier today. FINDINGS: MRI HEAD FINDINGS Brain: Diffusion imaging shows confluent acute infarction within the right middle cerebral artery territory with some sparing of  the anterior insula and frontal operculum and only spotty involvement the caudate and putamen. Mild swelling of the infarction with minimal petechial blood products but no frank hematoma. No focal abnormality affects the brainstem or  cerebellum. There has been right parietal craniotomy for evacuation of a large right subdural hematoma. Small amount subdural blood does persist, maximal thickness in the frontal region 8 mm. Small amount of subdural blood also in the left parietal and occipital region, maximal thickness 1 cm without significant mass effect. Right-to-left midline shift presently of 7.5 mm. Mild chronic small-vessel ischemic changes of the white matter. Old infarction at the left parietooccipital junction with chronic volume loss. Small amount of hemorrhagic contusion in this region. Small amount of blood dependent within the occipital horns of the lateral ventricles, without evidence of obstructive hydrocephalus. Vascular: Major vessels at the base of the brain show flow. Skull and upper cervical spine: No primary calvarial finding. Sinuses/Orbits: Rhinosinusitis of the right maxillary sinus. Orbits negative. Other: Left parietal scalp bruising. MRA HEAD FINDINGS Anterior circulation: Both internal carotid arteries widely patent through the skull base and siphon regions. The anterior and middle cerebral vessels are patent. No evidence of large or medium vessel occlusion in the right MCA territory. Probable luxury perfusion throughout the right MCA territory. Azygos anterior cerebral artery incidentally noted. Posterior circulation: Both vertebral arteries widely patent to the basilar. No basilar stenosis. Posterior circulation branch vessels are patent. Anatomic variants: None other significant. IMPRESSION: Confluent acute infarction throughout the majority of the right middle cerebral artery territory, with some sparing of the anterior insula and frontal operculum. Spotty involvement of the right caudate and putamen. Swelling and petechial blood products but no frank hematoma. Mass effect with right-to-left shift of 7.5 mm. Recent right parietal craniectomy for evacuation of subdural hematoma. Residual subdural blood and fluid on  the right, maximal thickness 8 mm. Residual subdural blood in the left parieto-occipital region, maximal thickness 1 cm. Small amount of blood dependent in the occipital horns of the lateral ventricles without evidence of change in ventricular size. Small hemorrhagic contusion in the region of subacute infarction in the right parietooccipital junction. MR angiography negative for large or medium vessel occlusion presently. Luxury perfusion in the MCA branches. Electronically Signed   By: Nelson Chimes M.D.   On: 08/07/2020 16:40   Portable Chest x-ray  Result Date: 08/07/2020 CLINICAL DATA:  Status post ET tube placement. EXAM: PORTABLE CHEST 1 VIEW COMPARISON:  Jul 27, 2020. FINDINGS: Endotracheal tube tip is at the level of the clavicular heads. Gastric tube courses below the diaphragm with the tip outside the field of view. The side port is approximately above the gastroesophageal junction. Similar left basilar opacities. No visible pneumothorax or large pleural effusion on this single AP semi erect radiograph. Similar left basilar opacities. Cardiomediastinal silhouette is similar. 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 Aortic atherosclerosis. IMPRESSION: 1. Endotracheal tube tip is at the level of the clavicular heads. 2. Gastric tube courses below the diaphragm with the tip outside the field of view and the side port 3 cm above the gastroesophageal junction. Recommend advancement if intragastric position of the side port is desired. 3. Similar left basilar opacities, which could represent atelectasis or pneumonia. Electronically Signed   By: Margaretha Sheffield MD   On: 08/07/2020 13:02   DG Abd Portable 1V  Result Date: 08/07/2020 CLINICAL DATA:  Gastric tube placement EXAM: PORTABLE ABDOMEN - 1 VIEW COMPARISON:  None. FINDINGS: NG tube in the stomach with  the tip in the lateral body of the stomach. Normal bowel gas pattern. Contrast in the renal collecting system from proceeding  angiography. Normal bowel gas pattern. IMPRESSION: NG tube in the stomach. Electronically Signed   By: Franchot Gallo M.D.   On: 08/07/2020 14:10   ECHOCARDIOGRAM COMPLETE  Result Date: 08/07/2020    ECHOCARDIOGRAM REPORT   Patient Name:   [name] [name] Date of Exam: 08/07/2020 Medical Rec #:  [id number]     Height:       68.0 in Accession #:    [id number]    Weight:       204.4 lb Date of Birth:  1940/05/31     BSA:          2.063 m Patient Age:    80 years      BP:           116/80 mmHg Patient Gender: M             HR:           70 bpm. Exam Location:  Inpatient Procedure: 2D Echo, Cardiac Doppler, Color Doppler and Intracardiac            Opacification Agent Indications:    Stroke  History:        Patient has prior history of Echocardiogram examinations, most                 recent 02/22/2018. 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 Hx rheumatic heart                 disease. S/p AVR. S/p MVR.  Sonographer:    Clayton Lefort RDCS (AE) Referring Phys: [id number] Lorenza Chick  Sonographer Comments: Suboptimal apical window. IMPRESSIONS  1. Left ventricular ejection fraction, by estimation, is 60 to 65%. The left ventricle has normal function. The left ventricle has no regional wall motion abnormalities. There is severe concentric left ventricular hypertrophy. Diastolic function indeterminant due to mitral valve repair.  2. Right ventricular systolic function is normal. The right ventricular size is normal.  3. Left atrial size was severely dilated.  4. Right atrial size was mildly dilated.  5. The mitral valve has been repaired/replaced. A bioprosthetic mitral valve is present. The valve appears well seated with normal function by doppler interrogation. Mean gradient 22mmHg at HR 67bpm. There is no mitral valve regurgitation.  6. The aortic valve has been repaired/replaced. A bioprosthetic aortic valve is present and appears well seated with normal function by  doppler interrogation. Mean gradient 79mmHg, peak gradient 64mmHg, Vmax 2.37, DI 0.4. Trace aortic regurgitation visualized.  7. Aortic dilatation noted. 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 Comparison(s): Compared to prior TTE in our system (2019), the aortic root now measures 34mm (previously 62mm) and the ascending aorta now measures 19mm (previously 67mm). Notably, the patient has had subsequent TTEs after 2019 at [facility] clinic. FINDINGS  Left Ventricle: Left ventricular ejection fraction, by estimation, is 60 to 65%. The left ventricle has normal function. The left ventricle has no regional wall motion abnormalities. Definity contrast agent was given IV to delineate the left ventricular  endocardial borders. The left ventricular internal cavity size was normal in size. There is severe concentric left ventricular hypertrophy. Diastolic function indeterminant due to mitral valve  repair. Right Ventricle: The right ventricular size is normal. No increase in right ventricular wall thickness. Right ventricular systolic function is normal. Left Atrium: Left atrial size was severely dilated. Right Atrium: Right atrial size was mildly dilated. Pericardium: There is no evidence of pericardial effusion. Mitral Valve: The mitral valve has been repaired/replaced. A bioprosthetic mitral valve is present. The valve appears well seated with normal function by doppler interrogation. Mean gradient 41mmHg at HR 67bpm. There is no mitral valve regurgitation. Mean  gradient 57mmHg at HR 67bpm. No mitral regurgitation mitral valve regurgitation. MV peak gradient, 12.0 mmHg. The mean mitral valve gradient is 4.5 mmHg. Tricuspid Valve: The tricuspid valve is normal in structure. Tricuspid valve regurgitation is trivial. Aortic Valve: The aortic valve has been repaired/replaced. Aortic valve regurgitation is trivial.  A bioprosthetic aortic valve is present and appears well seated with normal function by doppler interrogation.Aortic valve mean gradient measures 12.0 mmHg.  Aortic valve peak gradient measures 21.0 mmHg. Aortic valve area, by VTI measures 1.67 cm. Pulmonic Valve: The pulmonic valve was normal in structure. Pulmonic valve regurgitation is trivial. Aorta: Aortic dilatation noted. 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 Venous: IVC assessment for right atrial pressure unable to be performed due to mechanical ventilation. IAS/Shunts: No atrial level shunt detected by color flow Doppler.  LEFT VENTRICLE PLAX 2D LVIDd:         3.90 cm LVIDs:         2.70 cm LV PW:         1.80 cm LV IVS:        2.00 cm LVOT diam:     2.40 cm LV SV:         76 LV SV Index:   37 LVOT Area:     4.52 cm  RIGHT VENTRICLE             IVC RV Basal diam:  3.40 cm     IVC diam: 2.00 cm RV S prime:     12.50 cm/s TAPSE (M-mode): 1.5 cm LEFT ATRIUM            Index       RIGHT ATRIUM           Index LA diam:      5.00 cm  2.42 cm/m  RA Area:     22.30 cm LA Vol (A2C): 88.8 ml  43.05 ml/m RA Volume:   68.00 ml  32.97 ml/m LA Vol (A4C): 126.0 ml 61.09 ml/m  AORTIC VALVE AV Area (Vmax):    1.83 cm AV Area (Vmean):   1.80 cm AV Area (VTI):     1.67 cm AV Vmax:           229.25 cm/s AV Vmean:          162.250 cm/s AV VTI:            0.453 m AV Peak Grad:      21.0 mmHg AV Mean Grad:      12.0 mmHg LVOT Vmax:         92.77 cm/s LVOT Vmean:        64.667 cm/s LVOT VTI:          0.168 m LVOT/AV VTI ratio: 0.37  AORTA Ao Root diam: 4.90 cm Ao Asc diam:  4.90 cm MITRAL VALVE            TRICUSPID  VALVE MV Area VTI:  1.64 cm  TR Peak grad:   15.2 mmHg MV Peak grad: 12.0 mmHg TR Vmax:        195.00 cm/s MV Mean grad: 4.5 mmHg MV Vmax:      1.74 m/s  SHUNTS MV Vmean:     97.3 cm/s Systemic VTI:  0.17 m                         Systemic  Diam: 2.40 cm Gwyndolyn Kaufman MD Electronically signed by Gwyndolyn Kaufman MD Signature Date/Time: 08/07/2020/6:59:58 PM    Final    CT ANGIO HEAD NECK W WO CM W PERF (CODE STROKE)  Addendum Date: 08/07/2020   ADDENDUM REPORT: 08/07/2020 10:13 ADDENDUM: These results were called by telephone at the time of interpretation on 08/07/2020 at 10:05 am to provider [facility] , who verbally acknowledged these results. Electronically Signed   By: Kellie Simmering DO   On: 08/07/2020 10:13   Result Date: 08/07/2020 CLINICAL DATA:  Mental status change, unknown cause. EXAM: CT ANGIOGRAPHY HEAD AND NECK CT PERFUSION BRAIN TECHNIQUE: Multidetector CT imaging of the head and neck was performed using the standard protocol during bolus administration of intravenous contrast. Multiplanar CT image reconstructions and MIPs were obtained to evaluate the vascular anatomy. Carotid stenosis measurements (when applicable) are obtained utilizing NASCET criteria, using the distal internal carotid diameter as the denominator. Multiphase CT imaging of the brain was performed following IV bolus contrast injection. Subsequent parametric perfusion maps were calculated using RAPID software. CONTRAST:  136mL OMNIPAQUE IOHEXOL 350 MG/ML SOLN COMPARISON:  Noncontrast head CT examinations 08/07/2020 and earlier. MRA neck 08/06/2013, MRA head 08/06/2013. FINDINGS: CTA NECK FINDINGS Aortic arch: The visualized ascending thoracic aorta measures up to 4 mm in diameter. Standard aortic branching. Atherosclerotic plaque within the visualized aortic arch and proximal major branch vessels of the neck. No hemodynamically significant innominate or proximal subclavian artery stenosis. Right carotid system: The CCA is patent to the bifurcation. Mild to moderate calcified plaque within the distal CCA and carotid bifurcation. The ICA becomes occluded shortly beyond its origin and remains occluded throughout the remainder of the neck. Left carotid system: CCA  and ICA patent within the neck without stenosis. Minimal calcified plaque within the carotid bifurcation. Tortuosity of the distal cervical ICA. Vertebral arteries: Vertebral arteries patent within the neck. Nonstenotic calcified plaque at the origin of the right vertebral artery. Calcified plaque results in moderate stenosis at the origin of the left vertebral artery. Skeleton: Cervical spondylosis. No acute bony abnormality or aggressive osseous lesion. Other neck: No neck mass or cervical lymphadenopathy. Thyroid unremarkable. Upper chest: No consolidation within the imaged lung apices. Prior median sternotomy. Review of the MIP images confirms the above findings CTA HEAD FINDINGS Anterior circulation: The right ICA remains occluded intracranially with the exception of the very distal right ICA terminus. The M1 middle cerebral artery is patent, likely due to cross flow via the anterior communicating artery and right A1 segment. However, there is occlusion of a proximal M2 right MCA vessel (series 11, image 20) (series 12, image 19). The intracranial left ICA is patent. Mild calcified plaque within this vessel with no more than mild stenosis. The M1 left middle cerebral artery is patent. No left M2 proximal branch occlusion or proximal high-grade arterial stenosis. The anterior cerebral arteries are patent. Strongly dominant right anterior cerebral artery which supplies the bilateral ACA territories distally. No intracranial aneurysm is identified. Posterior circulation:  The intracranial vertebral arteries are patent. The basilar artery is patent. Apparent flow gaps within a right PCA P3/P4 branch, which may reflect short segment occlusions or severe stenoses. The left PCA is patent. Posterior communicating arteries are hypoplastic or absent bilaterally. Venous sinuses: The dural venous sinuses are poorly assessed due to contrast timing. Anatomic variants: As described Review of the MIP images confirms the above  findings CT Brain Perfusion Findings: Please note the reliability of the perfusion data is limited due to bolus quality and motion degradation. CBF (<30%) Volume: 50mL Perfusion (Tmax>6.0s) volume: 172mL (predominantly within the right MCA vascular territory). Mismatch Volume: 126mL Infarction Location:Right MCA vascular territory These results were called by telephone at the time of interpretation on 08/07/2020 at 9:37 am to provider Dr. Tessa Lerner, who verbally acknowledged these results. IMPRESSION: CTA neck: 1. The right internal carotid artery becomes occluded shortly beyond its origin and remains occluded throughout the remainder of the neck. 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 2. The left common carotid and internal carotid arteries are patent within the neck without stenosis. Mild calcified plaque within the left carotid bifurcation. 3. Vertebral arteries patent within the neck. Moderate atherosclerotic narrowing at the origin of the left vertebral artery. 4. The partially imaged ascending thoracic aorta measures up to 4 cm in diameter. Nonemergent CTA of the chest is recommended to more fully assess the degree of acid ending thoracic aortic dilation, and to determine appropriate follow-up. CTA head: 1. The intracranial right internal carotid artery is occluded with the exception of the very distal right ICA terminus. 2. The M1 right middle cerebral artery is patent, likely due to cross flow via the anterior communicating artery and right A1 segment. However, there is occlusion of a proximal right M2 MCA vessel. 3. Apparent flow gaps within a right PCA P3/P4 branch, which may reflect short-segment occlusions or high-grade stenoses. CT perfusion head: 1. Please note the reliability of the perfusion data is limited due to bolus quality and motion degradation. 2. The perfusion software identifies a 45 mL core infarct within the right MCA vascular territory. However, the size of the core infarct  is likely underestimated when correlating with the concurrently performed noncontrast head CT. 3. The perfusion software identifies a 162 mL region of hypoperfusion predominantly within the right MCA vascular territory. Reported mismatch volume: 117 mL. Electronically Signed: By: Kellie Simmering DO On: 08/07/2020 10:03    Labs:  CBC: Recent Labs    07/27/20 1833 07/27/20 1837 08/01/20 0610 08/07/20 0854 08/07/20 1337 08/08/20 0625  WBC 14.7*  --  13.4* 17.7*  --  17.5*  HGB 12.7*   < > 10.4* 13.1 12.2* 12.3*  HCT 38.4*   < > 31.1* 38.3* 36.0* 36.7*  PLT 216  --  214 311  --  254   < > = values in this interval not displayed.    COAGS: Recent Labs    07/26/20 1227 07/27/20 1833 07/28/20 0141 07/29/20 0157  INR 3.4* 3.4* 1.4* 1.1  APTT  --  45*  --   --     BMP: Recent Labs    08/01/20 0610 08/02/20 0548 08/03/20 0428 08/04/20 0455 08/05/20 0503 08/07/20 0854 08/07/20 1337 08/08/20 0625  NA 138  --  134*  --   --  134* 136 134*  K 4.1   < > 4.0   < > 4.4 4.1 4.0 3.8  CL 109  --  101  --   --  104  --  102  CO2 21*  --  27  --   --  24  --  24  GLUCOSE 145*  --  151*  --   --  116*  --  114*  BUN 26*  --  24*  --   --  29*  --  25*  CALCIUM 8.6*  --  8.8*  --   --  8.8*  --  8.1*  CREATININE 0.74  --  0.81  --   --  0.92  --  0.93  GFRNONAA >60  --  >60  --   --  >60  --  >60   < > = values in this interval not displayed.    LIVER FUNCTION TESTS: Recent Labs    07/27/20 1833 08/01/20 0610  BILITOT 0.9 1.1  AST 34 39  ALT 33 52*  ALKPHOS 65 46  PROT 7.0 5.9*  ALBUMIN 3.8 2.8*    Assessment and Plan:  History of acute CVA s/p cerebral arteriogram with emergent mechanical thrombectomy of right ICA terminus occlusion achieving a TICI 2c revascularization via right femoral approach 08/07/2020 by Dr. Estanislado Pandy. Patient intubated/sedated (weaning), moving all extremities, follows simple commands (wiggles toes). Right femoral puncture site stable, distal pulses  (DPs) palpable bilaterally. Further plans per neurology/CCM- appreciate and agree with management. NIR to follow.   Electronically Signed: Earley Abide, PA-C 08/08/2020, 11:24 AM   I spent a total of 15 Minutes at the the patient's bedside AND on the patient's hospital floor or unit, greater than 50% of which was counseling/coordinating care for CVA s/p revascularization.

## 2020-08-08 NOTE — Progress Notes (Addendum)
NAME:  [name] [name], MRN:  [id number], DOB:  1940/07/27, LOS: 1 ADMISSION DATE:  08/07/2020, CONSULTATION DATE:  08/07/20 REFERRING MD:  Estanislado Pandy - NIR, CHIEF COMPLAINT:  AMS - R MCA CVA - code stroke  History of Present Illness:  80 yo M PMH Afib (on warfarin), thoracic aortic aneurysm, s/p AVR/MVR, CHF who recently admitted for traumatic SDH following a fall while on warfarin. Pt was admitted 5/23 and taken for crani/evac, ultimately discharged to [facility] 5/27 where he has remained. Warfarin continued to be held in CIR with plan to resume 6/3.  On 6/3 pt noted to be somnolent, and ultimately a code stroke was initiated. CT H reveals large R MCA CVA. Pt taken urgently for thrombectomy, with TICI 2c revascularization. He remains intubated following the case and PCCM is asked to consult in this setting.   Labs at time of admission  BMP - Na 134 Cr 0.92 BUN 29  CBC - WBC 17.7 hgb 13.1 Glu 116  Pertinent  Medical History  Traumatic SDH (07/2020. Ground level fall, on warfarin)  Afib/flutter Aortic aneurysm  CHF Hemorrhoids TIA  HLD Rheumatic pericarditis  S/p MVR AVR   Significant Hospital Events: Including procedures, antibiotic start and stop dates in addition to other pertinent events   . AMS found to have large R MCA CVA taken [facility] and admitted to neuro ICU following procedure, remains intubated. WBC 17, LLL opacities -- starting unasyn   Interim History / Subjective:  Remains intubated following NIR, where pt was taken urgently for management of R MCA occlusion  Objective   Blood pressure 130/81, pulse 74, temperature 98.9 F (37.2 C), temperature source Axillary, resp. rate 14, height 5\' 7"  (1.702 m), weight 84.5 kg, SpO2 98 %.    Vent Mode: CPAP;PSV FiO2 (%):  [40 %-100 %] 40 % Set Rate:  [18 bmp] 18 bmp Vt Set:  [540 mL] 540 mL PEEP:  [5 cmH20] 5 cmH20 Pressure Support:  [10 cmH20] 10 cmH20 Plateau Pressure:  [14 cmH20-17 cmH20] 14 cmH20   Intake/Output Summary (Last 24  hours) at 08/08/2020 1206 Last data filed at 08/08/2020 0900 Gross per 24 hour  Intake 1803.94 ml  Output 875 ml  Net 928.94 ml   Filed Weights   08/07/20 2020  Weight: 84.5 kg    Examination: General: Critically ill appearing elderly M intubated sedated NAD HENT: NCAT pink mmm anicteric sclera ETT secure Lungs: CTAb symmetrical chest expansion, mechanically ventialted Cardiovascular: s1s2 no rgm cap refill brisk  Abdomen: soft round ndnt + bowel sounds  Extremities: No acute joint deformity no cyanosis or clubbing no edema Neuro: Following commands R hand bilateral toes with sedation pause. R gaze deviation  GU: yellow urine   Labs/imaging that I havepersonally reviewed  (right click and "Reselect all SmartList Selections" daily)  6/3 CTH> New large R MCA infarct. Unchanged right cerebral SDH and left parietal extra-axial hemorrhage, unchanged mass effect and 10mm leftward shift   6/3 MRI> large R MCA infarct, mass effect, 7.81mm R-L midline shift   6/3 ECHO> LVEF 60-65% severe LVH. Indeterminate diastolic function due to MVR. Normal RV systolic function. Severely dilated LA mildly dilated RA. Bioprosthetic MV. Bioprosthetic AV. Moderate-severe dilation of aortic root 51mm  6/4 BMP CBC PCT    Resolved Hospital Problem list     Assessment & Plan:    Acute R MCA CVA s/p thrombectomy with TICI 2c revascularization with mass effect, 7.103mm R-L midline shift  P -per neuro and NIR -  SBP goal 120-140. Cleviprex available.  -anticoagulation plan per neurology, right now just on ASA 325mg  -planning for repeat imaging 6/5  Acute respiratory failure requiring mechanical ventilation  -in setting of CVA and procedure -6/4 without demonstration of sufficient airway protection to extubate  LLL opacity - ? PNA  P -Tolerating PSV/CPAP -Mentation limiting extubation -- cannot cough on command, lift head off bed, stick out tongue   -VAP, pulm hygiene   Leukocytosis - stable  -WBC 17.7  infectious vs related to blood in brain v possible blood clot. Clinically I favor against infection  -Has a LLL opacity, but PCT <0.1 x2 and afebrile  P -short course of unasyn given LLL opacitiy and aspiration risk with acute MCA  -trach aspirate sent 6/4, follow  -trend WBC and fever curve -check lower extremity dopplers   Hx recent [place]) -stable on imaging 6/3  -admitted 5/23, taken for evac discharged to [place] 5/27  -plan was to start back warfarin 6/3   Hx Afib/flutter  S/p AVR and MVR (bioprosthetic)  HTN -anticoagulation held following SDH, plan was restart warfarin 6/3 before requiring ICU admission  P -anticoag per neuro  -Scheduled metop, follow CVA SBP goal 120-140 -- as above, cleviprex available if needed -tikosyn   BPH -proscar flomax -foley  Inadequate PO intake P -EN per RDN  Best practice (right click and "Reselect all SmartList Selections" daily)  Diet:  NPO Pain/Anxiety/Delirium protocol (if indicated): Yes (RASS goal -1) VAP protocol (if indicated): Yes DVT prophylaxis: SCD GI prophylaxis: PPI Glucose control:  SSI No Central venous access:  N/A Arterial line:  N/A Foley:  Yes, and it is still needed Mobility:  bed rest  PT consulted: N/A Last date of multidisciplinary goals of care discussion [per primary] Code Status:  full code Disposition: ICU   Labs   CBC: Recent Labs  Lab 08/07/20 0854 08/07/20 1337 08/08/20 0625  WBC 17.7*  --  17.5*  NEUTROABS 14.4*  --  13.5*  HGB 13.1 12.2* 12.3*  HCT 38.3* 36.0* 36.7*  MCV 92.1  --  94.1  PLT 311  --  675    Basic Metabolic Panel: Recent Labs  Lab 08/02/20 0548 08/03/20 0428 08/04/20 0455 08/05/20 0503 08/07/20 0854 08/07/20 1337 08/08/20 0625  NA  --  134*  --   --  134* 136 134*  K 3.9 4.0 4.3 4.4 4.1 4.0 3.8  CL  --  101  --   --  104  --  102  CO2  --  27  --   --  24  --  24  GLUCOSE  --  151*  --   --  116*  --  114*  BUN  --  24*  --    --  29*  --  25*  CREATININE  --  0.81  --   --  0.92  --  0.93  CALCIUM  --  8.8*  --   --  8.8*  --  8.1*  MG 2.1 2.0 2.2 2.1  --   --  2.0   GFR: Estimated Creatinine Clearance: 69.2 mL/min (by C-G formula based on SCr of 0.93 mg/dL). Recent Labs  Lab 08/07/20 0854 08/08/20 0625  PROCALCITON <0.10 <0.10  WBC 17.7* 17.5*    Liver Function Tests: No results for input(s): AST, ALT, ALKPHOS, BILITOT, PROT, ALBUMIN in the last 168 hours. No results for input(s): LIPASE, AMYLASE in the last 168 hours. No  results for input(s): AMMONIA in the last 168 hours.  ABG    Component Value Date/Time   PHART 7.419 08/07/2020 1337   PCO2ART 41.7 08/07/2020 1337   PO2ART 560 (H) 08/07/2020 1337   HCO3 27.1 08/07/2020 1337   TCO2 28 08/07/2020 1337   O2SAT 100.0 08/07/2020 1337     Coagulation Profile: No results for input(s): INR, PROTIME in the last 168 hours.  Cardiac Enzymes: No results for input(s): CKTOTAL, CKMB, CKMBINDEX, TROPONINI in the last 168 hours.  HbA1C: Hgb A1c MFr Bld  Date/Time Value Ref Range Status  08/01/2020 06:10 AM 6.2 (H) 4.8 - 5.6 % Final    Comment:    (NOTE)         Prediabetes: 5.7 - 6.4         Diabetes: >6.4         Glycemic control for adults with diabetes: <7.0   07/31/2020 04:12 PM 6.2 (H) 4.8 - 5.6 % Final    Comment:    (NOTE)         Prediabetes: 5.7 - 6.4         Diabetes: >6.4         Glycemic control for adults with diabetes: <7.0     CBG: Recent Labs  Lab 08/07/20 0859 08/07/20 2050 08/08/20 0021 08/08/20 0402 08/08/20 0825  GLUCAP 112* 110* 115* 107* 109*     CRITICAL CARE Performed by: Cristal Generous   Total critical care time: 40 minutes  Critical care time was exclusive of separately billable procedures and treating other patients. Critical care was necessary to treat or prevent imminent or life-threatening deterioration.  Critical care was time spent personally by me on the following activities: development of  treatment plan with patient and/or surrogate as well as nursing, discussions with consultants, evaluation of patient's response to treatment, examination of patient, obtaining history from patient or surrogate, ordering and performing treatments and interventions, ordering and review of laboratory studies, ordering and review of radiographic studies, pulse oximetry and re-evaluation of patient's condition.  Eliseo Gum MSN, AGACNP-BC [place] for pager 08/08/2020, 12:27 PM

## 2020-08-08 NOTE — Progress Notes (Signed)
Pharmacy: Dofetilide (Tikosyn) - Follow Up Assessment and Electrolyte Replacement  Pharmacy consulted to assist in monitoring and replacing electrolytes in this 80 y.o. male admitted on 08/07/2020 undergoing dofetilide continuation.  Labs:    Component Value Date/Time   K 3.8 08/08/2020 0625   K 4.1 11/26/2013 0000   MG 2.0 08/08/2020 0625     Plan: Potassium: 3.8 >> KCL x 3 runs   [name] [name] D. Mina Marble, PharmD, BCPS, [place] 08/08/2020, 1:21 PM

## 2020-08-08 NOTE — Progress Notes (Signed)
[place] Progress Note Patient Name: [name] [name] DOB: [birth year] MRN: [id number]   Date of Service  08/08/2020  HPI/Events of Note  Agitation - Patient attempting to pull at ETT with R hand. Nurse request for soft R wrist restraint.  eICU Interventions  Will order soft R wrist restraint X 6 hours.      Intervention Category Major Interventions: Delirium, psychosis, severe agitation - evaluation and management  Ebon Ketchum [place] 08/08/2020, 2:52 AM

## 2020-08-09 ENCOUNTER — Inpatient Hospital Stay: Payer: Self-pay

## 2020-08-09 ENCOUNTER — Inpatient Hospital Stay (HOSPITAL_COMMUNITY): Payer: Medicare Other

## 2020-08-09 LAB — BASIC METABOLIC PANEL
Anion gap: 5 (ref 5–15)
BUN: 20 mg/dL (ref 8–23)
CO2: 24 mmol/L (ref 22–32)
Calcium: 8.1 mg/dL — ABNORMAL LOW (ref 8.9–10.3)
Chloride: 106 mmol/L (ref 98–111)
Creatinine, Ser: 0.81 mg/dL (ref 0.61–1.24)
GFR, Estimated: >60 mL/min (ref >60)
Glucose, Bld: 144 mg/dL — ABNORMAL HIGH (ref 70–99)
Potassium: 3.7 mmol/L (ref 3.5–5.1)
Sodium: 135 mmol/L (ref 135–145)

## 2020-08-09 LAB — SODIUM
Sodium: 136 mmol/L (ref 135–145)
Sodium: 140 mmol/L (ref 135–145)
Sodium: 141 mmol/L (ref 135–145)

## 2020-08-09 LAB — GLUCOSE, CAPILLARY
Glucose-Capillary: 124 mg/dL — ABNORMAL HIGH (ref 70–99)
Glucose-Capillary: 131 mg/dL — ABNORMAL HIGH (ref 70–99)
Glucose-Capillary: 146 mg/dL — ABNORMAL HIGH (ref 70–99)
Glucose-Capillary: 156 mg/dL — ABNORMAL HIGH (ref 70–99)
Glucose-Capillary: 156 mg/dL — ABNORMAL HIGH (ref 70–99)
Glucose-Capillary: 158 mg/dL — ABNORMAL HIGH (ref 70–99)

## 2020-08-09 LAB — CBC WITH DIFFERENTIAL/PLATELET
Abs Immature Granulocytes: 0.11 10*3/uL — ABNORMAL HIGH (ref 0.00–0.07)
Basophils Absolute: 0 10*3/uL (ref 0.0–0.1)
Basophils Relative: 0 %
Eosinophils Absolute: 0.2 10*3/uL (ref 0.0–0.5)
Eosinophils Relative: 1 %
HCT: 34.6 % — ABNORMAL LOW (ref 39.0–52.0)
Hemoglobin: 11.6 g/dL — ABNORMAL LOW (ref 13.0–17.0)
Immature Granulocytes: 1 %
Lymphocytes Relative: 10 %
Lymphs Abs: 1.7 10*3/uL (ref 0.7–4.0)
MCH: 31.4 pg (ref 26.0–34.0)
MCHC: 33.5 g/dL (ref 30.0–36.0)
MCV: 93.5 fL (ref 80.0–100.0)
Monocytes Absolute: 1.7 10*3/uL — ABNORMAL HIGH (ref 0.1–1.0)
Monocytes Relative: 11 %
Neutro Abs: 12.6 10*3/uL — ABNORMAL HIGH (ref 1.7–7.7)
Neutrophils Relative %: 77 %
Platelets: 229 10*3/uL (ref 150–400)
RBC: 3.7 MIL/uL — ABNORMAL LOW (ref 4.22–5.81)
RDW: 14.1 % (ref 11.5–15.5)
WBC: 16.4 10*3/uL — ABNORMAL HIGH (ref 4.0–10.5)
nRBC: 0 % (ref 0.0–0.2)

## 2020-08-09 LAB — PHOSPHORUS
Phosphorus: 2.4 mg/dL — ABNORMAL LOW (ref 2.5–4.6)
Phosphorus: 2.7 mg/dL (ref 2.5–4.6)

## 2020-08-09 LAB — MAGNESIUM
Magnesium: 2.1 mg/dL (ref 1.7–2.4)
Magnesium: 2 mg/dL (ref 1.7–2.4)

## 2020-08-09 LAB — PROCALCITONIN: Procalcitonin: <0.1 ng/mL

## 2020-08-09 MED ORDER — SODIUM CHLORIDE 0.9% FLUSH
10.0000 mL | Freq: Two times a day (BID) | INTRAVENOUS | Status: DC
  Administered 2020-08-09 – 2020-08-11 (×4): 10 mL
  Administered 2020-08-11: 20 mL
  Administered 2020-08-12 – 2020-08-15 (×7): 10 mL

## 2020-08-09 MED ORDER — POTASSIUM CHLORIDE 20 MEQ PO PACK
20.0000 meq | PACK | Freq: Once | ORAL | Status: AC
  Administered 2020-08-09: 20 meq
  Filled 2020-08-09: qty 1

## 2020-08-09 MED ORDER — PANTOPRAZOLE SODIUM 40 MG PO PACK
40.0000 mg | PACK | Freq: Every day | ORAL | Status: DC
  Administered 2020-08-09 – 2020-08-15 (×7): 40 mg
  Filled 2020-08-09 (×6): qty 20

## 2020-08-09 MED ORDER — SODIUM CHLORIDE 3 % IV SOLN
INTRAVENOUS | Status: DC
  Filled 2020-08-09 (×8): qty 500

## 2020-08-09 MED ORDER — SODIUM CHLORIDE 3 % IV BOLUS
250.0000 mL | Freq: Once | INTRAVENOUS | Status: AC
  Administered 2020-08-09: 250 mL via INTRAVENOUS
  Filled 2020-08-09: qty 500

## 2020-08-09 MED ORDER — POTASSIUM CHLORIDE 20 MEQ PO PACK
40.0000 meq | PACK | Freq: Once | ORAL | Status: AC
  Administered 2020-08-09: 40 meq
  Filled 2020-08-09: qty 2

## 2020-08-09 MED ORDER — SODIUM CHLORIDE 0.9% FLUSH
10.0000 mL | INTRAVENOUS | Status: DC | PRN
Start: 2020-08-09 — End: 2020-08-15
  Administered 2020-08-09: 10 mL

## 2020-08-09 MED ORDER — ENOXAPARIN SODIUM 40 MG/0.4ML IJ SOSY
40.0000 mg | PREFILLED_SYRINGE | Freq: Every day | INTRAMUSCULAR | Status: DC
  Administered 2020-08-09 – 2020-08-15 (×7): 40 mg via SUBCUTANEOUS
  Filled 2020-08-09 (×7): qty 0.4

## 2020-08-09 MED ORDER — POTASSIUM & SODIUM PHOSPHATES 280-160-250 MG PO PACK
2.0000 | PACK | ORAL | Status: AC
  Administered 2020-08-09 (×2): 2 via ORAL
  Filled 2020-08-09 (×2): qty 1

## 2020-08-09 NOTE — Progress Notes (Signed)
Peripherally Inserted Central Catheter Placement  The IV Nurse has discussed with the patient and/or persons authorized to consent for the patient, the purpose of this procedure and the potential benefits and risks involved with this procedure.  The benefits include less needle sticks, lab draws from the catheter, and the patient may be discharged home with the catheter. Risks include, but not limited to, infection, bleeding, blood clot (thrombus formation), and puncture of an artery; nerve damage and irregular heartbeat and possibility to perform a PICC exchange if needed/ordered by physician.  Alternatives to this procedure were also discussed.  Bard Power PICC patient education guide, fact sheet on infection prevention and patient information card has been provided to patient /or left at bedside.  Wife at bedside gave consent.   PICC Placement Documentation  PICC Triple Lumen 08/09/20 PICC Right Cephalic 40 cm 0 cm (Active)  Indication for Insertion or Continuance of Line Administration of hyperosmolar/irritating solutions (i.e. TPN, Vancomycin, etc.) 08/09/20 1745  Exposed Catheter (cm) 0 cm 08/09/20 1745  Site Assessment Dry;Clean;Intact 08/09/20 1745  Lumen #1 Status Flushed;Saline locked;Blood return noted 08/09/20 1745  Lumen #2 Status Flushed;Saline locked;Blood return noted 08/09/20 1745  Lumen #3 Status Flushed;Saline locked;Blood return noted 08/09/20 1745  Dressing Type Transparent 08/09/20 1745  Dressing Status Clean;Dry;Intact 08/09/20 1745  Antimicrobial disc in place? Yes 08/09/20 1745  Safety Lock Not Applicable 84/72/07 2182  Dressing Intervention New dressing 08/09/20 1745  Dressing Change Due 08/16/20 08/09/20 1745       [name] [name] 08/09/2020, 5:47 PM

## 2020-08-09 NOTE — Progress Notes (Signed)
NAME:  [name] [name], MRN:  [id number], DOB:  [birth year], LOS: 2 ADMISSION DATE:  08/07/2020, CONSULTATION DATE:  08/07/20 REFERRING MD:  Estanislado Pandy - NIR, CHIEF COMPLAINT:  AMS - R MCA CVA - code stroke  History of Present Illness:  80 yo M PMH Afib (on warfarin), thoracic aortic aneurysm, s/p AVR/MVR, CHF who recently admitted for traumatic SDH following a fall while on warfarin. Pt was admitted 5/23 and taken for crani/evac, ultimately discharged to [facility] 5/27 where he has remained. Warfarin continued to be held in CIR with plan to resume 6/3.  On 6/3 pt noted to be somnolent, and ultimately a code stroke was initiated. CT H reveals large R MCA CVA. Pt taken urgently for thrombectomy, with TICI 2c revascularization. He remains intubated following the case and PCCM is asked to consult in this setting.   Labs at time of admission  BMP - Na 134 Cr 0.92 BUN 29  CBC - WBC 17.7 hgb 13.1 Glu 116  Pertinent  Medical History  Traumatic SDH (07/2020. Ground level fall, on warfarin)  Afib/flutter Aortic aneurysm  CHF Hemorrhoids TIA  HLD Rheumatic pericarditis  S/p MVR AVR   Significant Hospital Events: Including procedures, antibiotic start and stop dates in addition to other pertinent events   . AMS found to have large R MCA CVA taken to [facility] and admitted to neuro ICU following procedure, remains intubated. WBC 17, LLL opacities -- starting unasyn   Interim History / Subjective:  PSV - tolerating well.  NAEO  CT this morning   Answered wife's questions about pts prior ECHOs  Objective   Blood pressure 117/69, pulse 81, temperature (!) 97.4 F (36.3 C), temperature source Axillary, resp. rate 16, height 5\' 7"  (1.702 m), weight 89.6 kg, SpO2 99 %.    Vent Mode: CPAP;PSV FiO2 (%):  [40 %] 40 % Set Rate:  [18 bmp] 18 bmp Vt Set:  [540 mL] 540 mL PEEP:  [5 cmH20] 5 cmH20 Pressure Support:  [5 cmH20-10 cmH20] 5 cmH20 Plateau Pressure:  [12 cmH20] 12 cmH20   Intake/Output Summary (Last  24 hours) at 08/09/2020 1403 Last data filed at 08/09/2020 0600 Gross per 24 hour  Intake 1199.98 ml  Output 2500 ml  Net -1300.02 ml   Filed Weights   08/07/20 2020 08/09/20 0400  Weight: 84.5 kg 89.6 kg    Examination: General: Critically ill older adult M, intubated lightly sedated NAD HENT: NCAT pink mm anicteric sclera ETT secure  Lungs: CTAb symmetrical chest expansion, even unlabored on PSV Cardiovascular: rr s1s2 no rgm  Abdomen: soft round ndnt + bowel sounds  Extremities: No acute joint deformity no cyanosis or clubbing Neuro: Following R sided commands. Stronger grip than prior. Raises eyebrows and can slightly open eyes to commands.  GU: yellow urine   Labs/imaging that I havepersonally reviewed  (right click and "Reselect all SmartList Selections" daily)  6/3 CTH> New large R MCA infarct. Unchanged right cerebral SDH and left parietal extra-axial hemorrhage, unchanged mass effect and 64mm leftward shift   6/3 MRI> large R MCA infarct, mass effect, 7.2mm R-L midline shift   6/3 ECHO> LVEF 60-65% severe LVH. Indeterminate diastolic function due to MVR. Normal RV systolic function. Severely dilated LA mildly dilated RA. Bioprosthetic MV. Bioprosthetic AV. Moderate-severe dilation of aortic root 90mm  6/4 BLE doppler> no dvt  6/5 CT H> large R MCA infarct with increased cytotoxic edema and increasing mass effect   6/5 BMP CBC PCT   Resolved Hospital  Problem list     Assessment & Plan:    Acute right MCA CVA s/p thrombectomy with TICI 2c revascularization, mass effect, 7.36mm R-L shift Cytotoxic edema P -per neuro and NIR -SBP goal 120-140. Cleviprex available.  -starting hypertonic 6/5  -anticoagulation plan per neurology, right now just on ASA 325mg   Acute respiratory failure requiring mechanical ventilation  -in setting of CVA and procedure -6/4 without demonstration of sufficient airway protection to extubate  P -Tolerating PSV/CPAP -Mentation limiting  extubation -- cannot cough on command, lift head off bed, stick out tongue   -VAP, pulm hygiene   Leukocytosis -WBC 16 -- infectious vs related to blood in brain v possible blood clot. Clinically I favor against infection  -Has a LLL opacity, but PCT <0.1 x2 and afebrile  -no DVT P -short course of unasyn given LLL opacitiy and aspiration risk with acute MCA  -trach aspirate neg  -trend WBC and fever curve  Hx recent [place] SD[place]  [place])  -stable on imaging 6/3  -admitted 5/23, taken for evac discharged to [place] 5/27  -plan was to start back warfarin 6/3  P -per neuro/nsgy   Hx afib/aflutter S/p AVR MVR (bioprosthetic) HTN -anticoagulation held following SDH, plan was restart warfarin 6/3 before requiring ICU admission  P -anticoag per neuro  -Scheduled metop, follow CVA SBP goal 120-140 -- as above, cleviprex available if needed -tikosyn   BPH -proscar flomax -foley  Inadequate PO intake  P -EN per RDN  Best practice (right click and "Reselect all SmartList Selections" daily)  Diet:  NPO Pain/Anxiety/Delirium protocol (if indicated): Yes (RASS goal -1) VAP protocol (if indicated): Yes DVT prophylaxis: SCD GI prophylaxis: PPI Glucose control:  SSI No Central venous access:  N/A Arterial line:  N/A Foley:  Yes, and it is still needed Mobility:  bed rest  PT consulted: N/A Last date of multidisciplinary goals of care discussion [per primary]  Code Status:  full code Disposition: ICU   Labs   CBC: Recent Labs  Lab 08/07/20 0854 08/07/20 1337 08/08/20 0625 08/09/20 0451  WBC 17.7*  --  17.5* 16.4*  NEUTROABS 14.4*  --  13.5* 12.6*  HGB 13.1 12.2* 12.3* 11.6*  HCT 38.3* 36.0* 36.7* 34.6*  MCV 92.1  --  94.1 93.5  PLT 311  --  254 681    Basic Metabolic Panel: Recent Labs  Lab 08/03/20 0428 08/04/20 0455 08/05/20 0503 08/07/20 0854 08/07/20 1337 08/08/20 0625 08/08/20 1431 08/08/20 1705 08/09/20 0451 08/09/20 1039  NA  134*  --   --  134* 136 134*  --   --  135 136  K 4.0   < > 4.4 4.1 4.0 3.8  --   --  3.7  --   CL 101  --   --  104  --  102  --   --  106  --   CO2 27  --   --  24  --  24  --   --  24  --   GLUCOSE 151*  --   --  116*  --  114*  --   --  144*  --   BUN 24*  --   --  29*  --  25*  --   --  20  --   CREATININE 0.81  --   --  0.92  --  0.93  --   --  0.81  --   CALCIUM 8.8*  --   --  8.8*  --  8.1*  --   --  8.1*  --   MG 2.0   < > 2.1  --   --  2.0 2.1 1.9 2.1  --   PHOS  --   --   --   --   --   --  3.3 3.2 2.4*  --    < > = values in this interval not displayed.   GFR: Estimated Creatinine Clearance: 81.6 mL/min (by C-G formula based on SCr of 0.81 mg/dL). Recent Labs  Lab 08/07/20 0854 08/08/20 0625 08/09/20 0451  PROCALCITON <0.10 <0.10 <0.10  WBC 17.7* 17.5* 16.4*    Liver Function Tests: No results for input(s): AST, ALT, ALKPHOS, BILITOT, PROT, ALBUMIN in the last 168 hours. No results for input(s): LIPASE, AMYLASE in the last 168 hours. No results for input(s): AMMONIA in the last 168 hours.  ABG    Component Value Date/Time   PHART 7.419 08/07/2020 1337   PCO2ART 41.7 08/07/2020 1337   PO2ART 560 (H) 08/07/2020 1337   HCO3 27.1 08/07/2020 1337   TCO2 28 08/07/2020 1337   O2SAT 100.0 08/07/2020 1337     Coagulation Profile: No results for input(s): INR, PROTIME in the last 168 hours.  Cardiac Enzymes: No results for input(s): CKTOTAL, CKMB, CKMBINDEX, TROPONINI in the last 168 hours.  HbA1C: Hgb A1c MFr Bld  Date/Time Value Ref Range Status  08/01/2020 06:10 AM 6.2 (H) 4.8 - 5.6 % Final    Comment:    (NOTE)         Prediabetes: 5.7 - 6.4         Diabetes: >6.4         Glycemic control for adults with diabetes: <7.0   07/31/2020 04:12 PM 6.2 (H) 4.8 - 5.6 % Final    Comment:    (NOTE)         Prediabetes: 5.7 - 6.4         Diabetes: >6.4         Glycemic control for adults with diabetes: <7.0     CBG: Recent Labs  Lab 08/08/20 1954  08/08/20 2328 08/09/20 0337 08/09/20 0823 08/09/20 1109  GLUCAP 156* 123* 146* 156* 124*     CRITICAL CARE Performed by: Cristal Generous   Total critical care time: 42 minutes  Critical care time was exclusive of separately billable procedures and treating other patients. Critical care was necessary to treat or prevent imminent or life-threatening deterioration.  Critical care was time spent personally by me on the following activities: development of treatment plan with patient and/or surrogate as well as nursing, discussions with consultants, evaluation of patient's response to treatment, examination of patient, obtaining history from patient or surrogate, ordering and performing treatments and interventions, ordering and review of laboratory studies, ordering and review of radiographic studies, pulse oximetry and re-evaluation of patient's condition.  Eliseo Gum MSN, AGACNP-BC [place] for pager  08/09/2020, 2:18 PM

## 2020-08-09 NOTE — Progress Notes (Signed)
SLP Cancellation Note  Patient Details Name: [name] [name] MRN: [id number] DOB: 1940-12-01   Cancelled treatment:       Reason Eval/Treat Not Completed: Patient not medically ready (remains on vent). Will continue to follow.    Osie Bond., M.A. [place] [facility] Pager [phone] Office [phone]  08/09/2020, 7:19 AM

## 2020-08-09 NOTE — Progress Notes (Addendum)
INTERVAL HISTORY His wife and son are at the bedside.  Pt opens his eyes today and follows simple commands on the R side, with L sided neglect. Repeated CT head today showed cytotoxic edema with 9 mm midline shift.   Pt is still intubated.    Vitals:   08/09/20 0500 08/09/20 0600 08/09/20 0745 08/09/20 0800  BP: 114/65 108/71 117/69   Pulse: 81 69 81   Resp: 18 18 16    Temp:    (!) 97.3 F (36.3 C)  TempSrc:    Axillary  SpO2: 97% 97% 99%   Weight:      Height:       CBC:  Recent Labs  Lab 08/08/20 0625 08/09/20 0451  WBC 17.5* 16.4*  NEUTROABS 13.5* 12.6*  HGB 12.3* 11.6*  HCT 36.7* 34.6*  MCV 94.1 93.5  PLT 254 924   Basic Metabolic Panel:  Recent Labs  Lab 08/08/20 0625 08/08/20 1431 08/08/20 1705 08/09/20 0451  NA 134*  --   --  135  K 3.8  --   --  3.7  CL 102  --   --  106  CO2 24  --   --  24  GLUCOSE 114*  --   --  144*  BUN 25*  --   --  20  CREATININE 0.93  --   --  0.81  CALCIUM 8.1*  --   --  8.1*  MG 2.0   < > 1.9 2.1  PHOS  --    < > 3.2 2.4*   < > = values in this interval not displayed.   Lipid Panel:  Recent Labs  Lab 08/08/20 0625  CHOL 119  TRIG 105  HDL 42  CHOLHDL 2.8  VLDL 21  LDLCALC 56   HgbA1c: No results for input(s): HGBA1C in the last 168 hours. Urine Drug Screen: No results for input(s): LABOPIA, COCAINSCRNUR, LABBENZ, AMPHETMU, THCU, LABBARB in the last 168 hours.  Alcohol Level No results for input(s): ETH in the last 168 hours.  IMAGING past 24 hours CT HEAD WO CONTRAST  Result Date: 08/09/2020 CLINICAL DATA:  80 year old male with altered mental status. Right MCA infarct with right ICA occlusion in the neck on CTA, occluded right MCA M2 branch. Status post endovascular reperfusion on 08/07/2020. EXAM: CT HEAD WITHOUT CONTRAST TECHNIQUE: Contiguous axial images were obtained from the base of the skull through the vertex without intravenous contrast. COMPARISON:  Posttreatment brain MRI 08/07/2020. Pretreatment head CT  08/07/2020. FINDINGS: Brain: Pre-existing right greater than left subdural hematomas, with lobulated blood along the left lateral parieto-occipital junction. Trace IVH was also present prior to treatment. Relatively large right MCA territory infarct with progressed cytotoxic edema and parenchymal heterogeneity from the prior CT. No malignant hemorrhagic transformation identified, but increasing intracranial mass effect now with leftward midline shift up to 9 mm (previously 4 mm series 3, image 18 today). Effaced right lateral ventricle. Stable left lateral ventricle size. Basilar cisterns remain patent. Stable gray-white matter differentiation elsewhere, including periventricular hypodensity and focal encephalomalacia underlying the left posterior convexity extra-axial blood. Vascular: Calcified atherosclerosis at the skull base. Skull: Recent right side craniotomy. No acute osseous abnormality identified. Sinuses/Orbits: Increased paranasal sinus fluid levels and opacification, intubated on the scout view. Tympanic cavities and mastoids remain clear. Other: Sequelae of recent craniotomy, skin staples in place. Orbits soft tissues appear stable and negative. IMPRESSION: 1. Evolved relatively large right MCA infarcts since 08/07/2020 with increased intracranial mass effect and increased leftward  midline shift now 9 mm. 2. No malignant hemorrhagic transformation. Pre-existing right greater than left subdural hematomas and trace IVH. Electronically Signed   By: Genevie Ann M.D.   On: 08/09/2020 09:32   VAS [place] LOWER EXTREMITY VENOUS (DVT)  Result Date: 08/08/2020  Lower Venous DVT Study Patient Name:  [name] [name]  Date of Exam:   08/08/2020 Medical Rec #: [id number]      Accession #:    [id number] Date of Birth: [birth year]      Patient Gender: M Patient Age:   79Y Exam Location:  [facility] Procedure:      VAS [place] LOWER EXTREMITY VENOUS (DVT) Referring Phys: [id number] [place]  --------------------------------------------------------------------------------  Indications: Stroke.  Comparison Study: No prior Performing Technologist: Oda Cogan RDMS, RVT  Examination Guidelines: A complete evaluation includes B-mode imaging, spectral Doppler, color Doppler, and power Doppler as needed of all accessible portions of each vessel. Bilateral testing is considered an integral part of a complete examination. Limited examinations for reoccurring indications may be performed as noted. The reflux portion of the exam is performed with the patient in reverse Trendelenburg.  +---------+---------------+---------+-----------+----------+--------------+ RIGHT    CompressibilityPhasicitySpontaneityPropertiesThrombus Aging +---------+---------------+---------+-----------+----------+--------------+ CFV      Full           Yes      Yes                                 +---------+---------------+---------+-----------+----------+--------------+ SFJ      Full                                                        +---------+---------------+---------+-----------+----------+--------------+ FV Prox  Full                                                        +---------+---------------+---------+-----------+----------+--------------+ FV Mid   Full                                                        +---------+---------------+---------+-----------+----------+--------------+ FV DistalFull                                                        +---------+---------------+---------+-----------+----------+--------------+ PFV      Full                                                        +---------+---------------+---------+-----------+----------+--------------+ POP      Full           Yes      Yes                                 +---------+---------------+---------+-----------+----------+--------------+  PTV      Full                                                         +---------+---------------+---------+-----------+----------+--------------+ PERO     Full                                                        +---------+---------------+---------+-----------+----------+--------------+   +---------+---------------+---------+-----------+----------+--------------+ LEFT     CompressibilityPhasicitySpontaneityPropertiesThrombus Aging +---------+---------------+---------+-----------+----------+--------------+ CFV      Full           Yes      Yes                                 +---------+---------------+---------+-----------+----------+--------------+ SFJ      Full                                                        +---------+---------------+---------+-----------+----------+--------------+ FV Prox  Full                                                        +---------+---------------+---------+-----------+----------+--------------+ FV Mid   Full                                                        +---------+---------------+---------+-----------+----------+--------------+ FV DistalFull                                                        +---------+---------------+---------+-----------+----------+--------------+ PFV      Full                                                        +---------+---------------+---------+-----------+----------+--------------+ POP      Full           Yes      Yes                                 +---------+---------------+---------+-----------+----------+--------------+ PTV      Full                                                        +---------+---------------+---------+-----------+----------+--------------+  PERO     Full                                                        +---------+---------------+---------+-----------+----------+--------------+     Summary: BILATERAL: - No evidence of deep vein thrombosis seen in the lower extremities, bilaterally.  -No evidence of popliteal cyst, bilaterally.   *See table(s) above for measurements and observations.    Preliminary     PHYSICAL EXAM GENERAL: intubated HEENT: Head is atraumatic, normocephalic. Eyes: PERRL.  LUNGS: deep breathing sounds CARDIOVASCULAR: tachycardic  ABDOMEN: Bowel sounds present.  Ext : normal pulse distally  NEUROLOGICAL EXAM MS intubated, opens his eyes to noxious stimuli, follows simple commands on the R side CN 2-12:  R gaze deviation, L facial asymmetry and weakness noted, no facial sensory deficits  Motor: : L sided weakness noted  - No Abnormal movements identified.   Sensory:  Unable to assess  -Toes: equivocal -No clonus;   Coordination:  Unable to assess  Gait was not tested considering pt's condition     ASSESSMENT/PLAN  1. 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 S/P thrombectomy with TICI2C   Pt was deemed not a candidate for TPA being outside the TPA window.   Vascular risk factors: HTN, DM, afib HLD   CT head  hypodensity right MCA vessel sign, aspects 5, R SDH unchanged, decreased interhemispheric [facility] SAH. Stable extraaxial hemorrhage over the L parietal lobe measuring 10 mm. 4 mm left midline shift  CTA head & neck RICA occlusion shortely beyond origin with R M2 occlusion. Moderate L vert stenosis. ascending thoracic aorta measures up to 4 cm in diameter, severe right PCA P3/P4 branch stenosis.   CT perfusion 45 mL core infarct within the right MCA vascular territory 162 mL region of hypoperfusion predominantly within the right MCA vascular territory.Reported mismatch volume: 117 mL.   Repeated CT head today showed Evolved relatively large right MCA infarcts since 08/07/2020 with increased intracranial mass effect and  increased leftward midline shift now 9 mm.    2D Echo lvef 60-65% SEVERELY dilated LA  LDL 56  HgbA1c 6.2   Lovenox for VTE prophylaxis Diet Order            Diet NPO time specified  Diet effective now                  ASA 325 mg per tube . [name]   Therapy recommendations:  Pending  Disposition:  Pending    Cerebral edema:   - Repeated CT head today showed increased cerebral edema with 9 mm mildline shift. Today is day 3 post stroke. Expected to be close to the peak. Will start pt on Hypertonic saline for the next 2 days and if stable can wean off. Baseline Na 135  -Close neuro monitoring - Will start pt on Hypertonic saline 3% NS at 40 cc/h, NA goal 150-155 - Check Na q6h - Will repeat CT head in am    Hypertension . Maintain SBP <160 for now.  . Restart Oral meds when pt is able to get.  Pt is on Toprol 75 mg bid and losartan 25 mg po daily and lasix 20 mg po daily . Long-term BP goal <!40/90  Hyperlipidemia  Home meds:  Lipitor 80 mg qhs  LDL 56, goal < 70  Restart Liptior when able to swallow  Continue statin at discharge  Diabetes type II   HgbA1c 6.2, goal < 7.0  Maintain BG 80-180  CBGs Recent Labs    08/08/20 2328 08/09/20 0337 08/09/20 0823  GLUCAP 123* 146* 156*      SSI    Dysarthria Dysphagia following cerebral infarction  -NPO until cleared by speech -ST -Advance diet as tolerated -May need PEG  Hemiplegia and hemiparesis following cerebral infarction affecting left non-dominant side  -PT/OT -PM&R consult  RESP Acute Respiratory Failure  -Appreciate vent management per ICU -wean when able   Paroxysmal atrial fibrillation -Rate control, continue metoprolol and home dofetilide -Hold anticoagulation for now pending clinical course. On ASA   Thoracic aortic aneurysm -Known problem is being followed in [place], recent imaging in May -Follow-up outside records  HEME No active anemia -Monitor -transfuse for  hgb < 7   GI/GU BPH -Gentle hydration -avoid nephrotoxic agents -Continue home medications  Fluid/Electrolyte Disorders -Monitor -Replete as needed  ID Possible   PNA, WBC 17.5 >16.4, CXR with L basilar consolidation  -On Unasyn  -NPO -Monitor  Nutrition N.p.o. overnight -diet consult  Prophylaxis DVT: Start subcutaneous prophylactic  SCDs for now GI: Pantoprazole Bowel: Senna as needed  Diet: NPO until cleared by speech  Code Status: Full Code   Hospital day # 2   To contact Stroke Continuity provider, please refer to [email or web address] After hours, contact General Neurology

## 2020-08-09 NOTE — Progress Notes (Signed)
PT Cancellation Note  Patient Details Name: [name] [name] MRN: [id number] DOB: [birth year]   Cancelled Treatment:    Reason Eval/Treat Not Completed: Patient not medically ready (remains on vent).  Wyona Almas, PT, DPT Acute Rehabilitation Services Pager [phone] Office [phone]    Deno Etienne 08/09/2020, 9:37 AM

## 2020-08-09 NOTE — Progress Notes (Signed)
Transported patient to CT scan and back to 4N30.  Patient was on full support, 100% FIO2 and tolerated it well.

## 2020-08-09 NOTE — Progress Notes (Signed)
Pharmacy: Dofetilide (Tikosyn) - Follow Up Assessment and Electrolyte Replacement  Pharmacy consulted to assist in monitoring and replacing electrolytes in this 80 y.o. male admitted on 08/07/2020 undergoing dofetilide continuation.  Labs:    Component Value Date/Time   K 3.7 08/09/2020 0451   K 4.1 11/26/2013 0000   MG 2.1 08/09/2020 0451     Plan: Potassium: down to 3.7 post 3 runs.  Phos also low at 2.4.  KCL 70mEq PT today PhosNAK 2 packets PT x 2 doses   Ryna Beckstrom D. Mina Marble, PharmD, BCPS, [place] 08/09/2020, 8:26 AM

## 2020-08-09 NOTE — Discharge Instructions (Signed)
Femoral Site Care This sheet gives you information about how to care for yourself after your procedure. Your health care provider may also give you more specific instructions. If you have problems or questions, contact your health care provider. What can I expect after the procedure? After the procedure, it is common to have:  Bruising that usually fades within 1-2 weeks.  Tenderness at the site. Follow these instructions at home: Wound care 1. Follow instructions from your health care provider about how to take care of your insertion site. Make sure you: ? Wash your hands with soap and water before you change your bandage (dressing). If soap and water are not available, use hand sanitizer. ? Change your dressing as directed- pressure dressing removed 24 hours post-procedure (and switch for bandaid), bandaid removed 72 hours post-procedure 2. Do not take baths, swim, or use a hot tub for 7 days post-procedure. 3. You may shower 48 hours after the procedure or as told by your health care provider. ? Gently wash the site with plain soap and water. ? Pat the area dry with a clean towel. ? Do not rub the site. This may cause bleeding. 4. Check your site every day for signs of infection. Check for: ? Redness, swelling, or pain. ? Fluid or blood. ? Warmth. ? Pus or a bad smell. Activity  Do not stoop, bend, or lift anything that is heavier than 10 lb (4.5 kg) for 2 weeks post-procedure.  Do not drive self for 2 weeks post-procedure. Contact a health care provider if you have:  A fever or chills.  You have redness, swelling, or pain around your insertion site. Get help right away if:  The catheter insertion area swells very fast.  You pass out.  You suddenly start to sweat or your skin gets clammy.  The catheter insertion area is bleeding, and the bleeding does not stop when you hold steady pressure on the area.  The area near or just beyond the catheter insertion site becomes  pale, cool, tingly, or numb. These symptoms may represent a serious problem that is an emergency. Do not wait to see if the symptoms will go away. Get medical help right away. Call your local emergency services (911 in the [place] Do not drive yourself to the hospital.  This information is not intended to replace advice given to you by your health care provider. Make sure you discuss any questions you have with your health care provider. Document Revised: 03/06/2017 Document Reviewed: 03/06/2017 Elsevier Patient Education  2020 Elsevier Inc. 

## 2020-08-10 ENCOUNTER — Inpatient Hospital Stay (HOSPITAL_COMMUNITY): Payer: Medicare Other

## 2020-08-10 ENCOUNTER — Encounter (HOSPITAL_COMMUNITY): Payer: Self-pay | Admitting: Interventional Radiology

## 2020-08-10 DIAGNOSIS — I63031 Cerebral infarction due to thrombosis of right carotid artery: Secondary | ICD-10-CM

## 2020-08-10 DIAGNOSIS — I6601 Occlusion and stenosis of right middle cerebral artery: Secondary | ICD-10-CM

## 2020-08-10 DIAGNOSIS — D72829 Elevated white blood cell count, unspecified: Secondary | ICD-10-CM

## 2020-08-10 DIAGNOSIS — J9601 Acute respiratory failure with hypoxia: Secondary | ICD-10-CM

## 2020-08-10 LAB — BASIC METABOLIC PANEL
Anion gap: 4 — ABNORMAL LOW (ref 5–15)
BUN: 21 mg/dL (ref 8–23)
CO2: 23 mmol/L (ref 22–32)
Calcium: 8 mg/dL — ABNORMAL LOW (ref 8.9–10.3)
Chloride: 115 mmol/L — ABNORMAL HIGH (ref 98–111)
Creatinine, Ser: 0.69 mg/dL (ref 0.61–1.24)
GFR, Estimated: >60 mL/min (ref >60)
Glucose, Bld: 166 mg/dL — ABNORMAL HIGH (ref 70–99)
Potassium: 3.9 mmol/L (ref 3.5–5.1)
Sodium: 142 mmol/L (ref 135–145)

## 2020-08-10 LAB — GLUCOSE, CAPILLARY
Glucose-Capillary: 121 mg/dL — ABNORMAL HIGH (ref 70–99)
Glucose-Capillary: 126 mg/dL — ABNORMAL HIGH (ref 70–99)
Glucose-Capillary: 139 mg/dL — ABNORMAL HIGH (ref 70–99)
Glucose-Capillary: 143 mg/dL — ABNORMAL HIGH (ref 70–99)
Glucose-Capillary: 149 mg/dL — ABNORMAL HIGH (ref 70–99)
Glucose-Capillary: 167 mg/dL — ABNORMAL HIGH (ref 70–99)

## 2020-08-10 LAB — SODIUM
Sodium: 143 mmol/L (ref 135–145)
Sodium: 144 mmol/L (ref 135–145)
Sodium: 145 mmol/L (ref 135–145)

## 2020-08-10 LAB — CULTURE, RESPIRATORY W GRAM STAIN: Culture: NORMAL

## 2020-08-10 LAB — HEMOGLOBIN A1C
Hgb A1c MFr Bld: 6.2 % — ABNORMAL HIGH (ref 4.8–5.6)
Mean Plasma Glucose: 131 mg/dL

## 2020-08-10 LAB — MAGNESIUM: Magnesium: 2.2 mg/dL (ref 1.7–2.4)

## 2020-08-10 MED ORDER — OSMOLITE 1.5 CAL PO LIQD
1000.0000 mL | ORAL | Status: DC
  Administered 2020-08-11 – 2020-08-13 (×4): 1000 mL
  Filled 2020-08-10 (×5): qty 1000

## 2020-08-10 MED ORDER — VITAL HIGH PROTEIN PO LIQD
1000.0000 mL | ORAL | Status: AC
  Administered 2020-08-10: 1000 mL

## 2020-08-10 MED ORDER — VITAL HIGH PROTEIN PO LIQD: 1000.0000 mL | ORAL | Status: DC

## 2020-08-10 MED ORDER — PROSOURCE TF PO LIQD
45.0000 mL | Freq: Three times a day (TID) | ORAL | Status: DC
  Administered 2020-08-10 – 2020-08-15 (×14): 45 mL
  Filled 2020-08-10 (×13): qty 45

## 2020-08-10 NOTE — Progress Notes (Signed)
OT Cancellation Note  Patient Details Name: [name] [name] MRN: [id number] DOB: 02-03-1941   Cancelled Treatment:    Reason Eval/Treat Not Completed: Patient at procedure or test/ unavailable (CT scan)  Billey Chang, OTR/L  Acute Rehabilitation Services Pager: [phone] Office: [phone] .  08/10/2020, 9:02 AM

## 2020-08-10 NOTE — Progress Notes (Addendum)
INTERVAL HISTORY His wife is at the bedside. Pt opens his eyes and follows simple commands on the R side, with L sided neglect. He is getting hypertonic IVF for cytotoxic edema with 9 mm midline shift seen on f/u CTH yesterday. Pt is still intubated. Will place cortrak for nutrition/meds.  Blood pressure adequately controlled.  Serum sodium is low at 143 only this morning and will increase hypertonic saline drip rate to 60 cc an hour  Vitals:   08/10/20 1129 08/10/20 1200 08/10/20 1300 08/10/20 1400  BP: 125/65 125/70 126/62 124/68  Pulse: 70 79 63 62  Resp:  18 16 18   Temp:  98.2 F (36.8 C)    TempSrc:  Axillary    SpO2: 98% 98% 96% 99%  Weight:      Height:       CBC:  Recent Labs  Lab 08/08/20 0625 08/09/20 0451  WBC 17.5* 16.4*  NEUTROABS 13.5* 12.6*  HGB 12.3* 11.6*  HCT 36.7* 34.6*  MCV 94.1 93.5  PLT 254 597   Basic Metabolic Panel:  Recent Labs  Lab 08/09/20 0451 08/09/20 1039 08/09/20 1611 08/09/20 2158 08/10/20 0553 08/10/20 0905  NA 135   < > 140   < > 142 143  K 3.7  --   --   --  3.9  --   CL 106  --   --   --  115*  --   CO2 24  --   --   --  23  --   GLUCOSE 144*  --   --   --  166*  --   BUN 20  --   --   --  21  --   CREATININE 0.81  --   --   --  0.69  --   CALCIUM 8.1*  --   --   --  8.0*  --   MG 2.1  --  2.0  --  2.2  --   PHOS 2.4*  --  2.7  --   --   --    < > = values in this interval not displayed.   Lipid Panel:  Recent Labs  Lab 08/08/20 0625  CHOL 119  TRIG 105  HDL 42  CHOLHDL 2.8  VLDL 21  LDLCALC 56   HgbA1c:  Recent Labs  Lab 08/08/20 0625  HGBA1C 6.2*   Urine Drug Screen: No results for input(s): LABOPIA, COCAINSCRNUR, LABBENZ, AMPHETMU, THCU, LABBARB in the last 168 hours.  Alcohol Level No results for input(s): ETH in the last 168 hours.  IMAGING past 24 hours CT HEAD WO CONTRAST  Result Date: 08/10/2020 CLINICAL DATA:  Right MCA territory infarction. Bilateral subdural hematomas. EXAM: CT HEAD WITHOUT  CONTRAST TECHNIQUE: Contiguous axial images were obtained from the base of the skull through the vertex without intravenous contrast. COMPARISON:  CT head without contrast 08/09/2020. MR head without contrast 08/07/2020 FINDINGS: Brain: Mass effect associated with the right MCA territory infarct is decreasing. Midline shift still measures 8 mm. Bilateral extra-axial collections remain. Left parietal infarct and extra-axial hemorrhage is stable. Left scalp contusion stable. No new infarct is present. Involvement of the right basal ganglia is stable. No new hemorrhage is present. Vascular: Atherosclerotic calcifications are present within the cavernous internal carotid arteries bilaterally. Right MCA branch hyperdensities again noted. Skull: Right frontal craniotomy.  Calvarium otherwise intact. 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 Fluid is present in the nasopharynx. Bilateral lens  replacements are noted. Globes and orbits are otherwise unremarkable. IMPRESSION: 1. Decreased mass effect associated with the right MCA territory infarct. 2. Stable bilateral extra-axial collections. 3. Stable left parietal infarct and extra-axial hemorrhage. 4. No new infarct. Electronically Signed   By: San Morelle M.D.   On: 08/10/2020 10:53    PHYSICAL EXAM GENERAL: intubated elderly Caucasian male HEENT: Head is atraumatic, normocephalic. Eyes: PERRL.  LUNGS: deep breathing sounds CARDIOVASCULAR: tachycardic  ABDOMEN: Bowel sounds present.  Ext : normal pulse distally  NEUROLOGICAL EXAM MS intubated, opens his eyes to tactile/verbal stimuli, follows simple commands on the R side CN 2-12:  R gaze deviation, L facial asymmetry and weakness noted, no facial sensory deficits Motor: : L sided weakness noted. No Abnormal movements identified.  Sensory:  Unable to assess; seems intact. Toes: equivocal. No clonus Coordination: Unable to assess Gait was not  tested considering pt's condition   ASSESSMENT/PLAN 1. 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 S/P thrombectomy with TICI2C. Pt was deemed not a candidate for TPA being outside the TPA window. Vascular risk factors: HTN, DM, afib HLD   CT head  hypodensity right MCA vessel sign, aspects 5, R SDH unchanged, decreased interhemispheric [facility] SAH. Stable extraaxial hemorrhage over the L parietal lobe measuring 10 mm. 4 mm left midline shift  CTA head & neck RICA occlusion shortely beyond origin with R M2 occlusion. Moderate L vert stenosis. ascending thoracic aorta measures up to 4 cm in diameter, severe right PCA P3/P4 branch stenosis.   CT perfusion 45 mL core infarct within the right MCA vascular territory 162 mL region of hypoperfusion predominantly within the right MCA vascular territory.Reported mismatch volume: 117 mL.   Repeated CT head today showed Evolved relatively large right MCA infarcts since 08/07/2020 with increased intracranial mass effect and increased leftward midline shift now 9 mm.   2D Echo lvef 60-65% SEVERELY dilated LA  LDL 56  HgbA1c 6.2   Lovenox for VTE prophylaxis Diet Order            Diet NPO time specified  Diet effective now                 No antiplatelet prior to hospitalization; currently on ASA 325 mg per tube   Therapy recommendations:  Pending  Disposition:  Pending  Hypertension . Maintain SBP <160 for now.  . Restart Oral meds when pt is able to get. Pt is on Toprol 75 mg bid and losartan 25 mg po daily and lasix 20 mg po daily . Long-term BP goal <!40/90  Hyperlipidemia  Home meds:  Lipitor 80 mg qhs  LDL 56, goal < 70  Lipitor restarted per tube  Continue statin at discharge  Diabetes type II   HgbA1c 6.2,  goal < 7.0  Maintain BG 80-180  CBGs Recent Labs    08/10/20 0336 08/10/20 0812 08/10/20 1149  GLUCAP 167* 139* 149*      SSI  Dysarthria, aphasia, dysphasia- d/t stroke Dysphagia following cerebral infarction  -NPO until cleared by speech once extubated -ST following -Advance diet as tolerated -May need PEG; will place Cortrak now for nutriton as currently OG is being used for meds only.  Hemiplegia and hemiparesis following cerebral infarction affecting left non-dominant side  -  PT/OT -PM&R consult  RESP Acute Respiratory Failure  -Appreciate vent management per ICU -wean when able  Paroxysmal atrial fibrillation -Rate control, continue metoprolol and home dofetilide -Hold anticoagulation for now pending clinical course. On ASA   Thoracic aortic aneurysm -Known problem is being followed in [place], recent imaging in May -Follow-up outside records  HEME No active anemia -Monitor -transfuse for hgb < 7   GI/GU BPH -Gentle hydration -avoid nephrotoxic agents -Continue home medications  Fluid/Electrolyte Disorders -Monitor -Replete as needed  ID Possible PNA, WBC 17.5 >16.4, CXR with L basilar consolidation  -On Unasyn  IV -NPO -Monitor  OTHER ISSUES:  Cerebral edema:  -CT head 6/5 (post stroke day 3, which will be near peak) showed increased cerebral edema with 9 mm mildline shift. Hypertonic saline started for 2 days and if stable can wean off. Baseline Na 135 -CTH 6/6-stable, decreased mass effect. [id number]  -Increase Hypertonic saline 3% NS to 60 cc/h, NA goal 150-155  - Check Na q6h  Hospital day # 3 Desiree Metzger-Cihelka, ARNP-C, ANVP-BC Pager: [phone] I have personally obtained history,examined this patient, reviewed notes, independently viewed imaging studies, participated in medical decision making and plan of care.ROS completed by me personally and pertinent positives fully documented  I have made any additions or  clarifications directly to the above note. Agree with note above. .  Patient continues to require ventilatory support for respiratory failure due to cytotoxic edema from his large stroke.  Increase hypertonic saline drip to 60 cc an hour for serum sodium goal between 150-155.  Long discussion with his wife at the bedside and answered questions.  Discussed with Dr. Tacy Learn critical care medicine. This patient is critically ill and at significant risk of neurological worsening, death and care requires constant monitoring of vital signs, hemodynamics,respiratory and cardiac monitoring, extensive review of multiple databases, frequent neurological assessment, discussion with family, other specialists and medical decision making of high complexity.I have made any additions or clarifications directly to the above note.This critical care time does not reflect procedure time, or teaching time or supervisory time of PA/NP/Med Resident etc but could involve care discussion time.  I spent 30 minutes of neurocritical care time  in the care of  this patient.     Antony Contras, MD Medical Director [place] Pager: [phone] 08/10/2020 4:06 PM  To contact Stroke Continuity provider, please refer to [email or web address] After hours, contact General Neurology

## 2020-08-10 NOTE — Progress Notes (Signed)
Initial Nutrition Assessment  DOCUMENTATION CODES:   Not applicable  INTERVENTION:   Initiate tube feeding via OG tube: Osmolite 1.5 at 55 ml/h (1320 ml per day) Prosource TF 45 ml TID  Provides 2100 kcal, 115 gm protein, 1003 ml free water daily   NUTRITION DIAGNOSIS:   Inadequate oral intake related to inability to eat as evidenced by NPO status.  GOAL:   Patient will meet greater than or equal to 90% of their needs  MONITOR:   TF tolerance,Diet advancement  REASON FOR ASSESSMENT:   Consult,Ventilator Enteral/tube feeding initiation and management  ASSESSMENT:   Pt with PMH of Afib on coumadin, thoracic aortic aneurysm, rheumatic pericarditis s/p AVR/MVR and CHF admitted 5/23 with SDH after a fall.    Pt discussed during ICU rounds and with RN.  Family at bedside. Pt familiar from recent ICU stay. Pt had progressed and was working with therapies during rehab stay.   5/23 s/p craniotomy/hematoma evacuation 5/27 d/c to CIR 6/3 pt somnolent and found to have large R MCA stroke off coumadin; taken emergently for thrombectomy with TICI 2 revascularization   Patient is currently intubated on ventilator support MV: 8.7 L/min Temp (24hrs), Avg:98.1 F (36.7 C), Min:97.7 F (36.5 C), Max:98.6 F (37 C)   Medications reviewed and include: colace, SSI, protonix, miralax, vitamin B12 Hypertonic saline Labs reviewed:  CBG's: 139-167   18 F OG tube: tip in the body of the stomach   NUTRITION - FOCUSED PHYSICAL EXAM:  Flowsheet Row Most Recent Value  Orbital Region No depletion  Upper Arm Region Mild depletion  Thoracic and Lumbar Region No depletion  Buccal Region Unable to assess  Temple Region No depletion  Clavicle Bone Region Mild depletion  Clavicle and Acromion Bone Region Moderate depletion  Scapular Bone Region Unable to assess  Dorsal Hand No depletion  Patellar Region No depletion  Anterior Thigh Region No depletion  Posterior Calf Region Mild  depletion  Edema (RD Assessment) None  Hair Reviewed  Eyes Unable to assess  Mouth Unable to assess  Skin Reviewed  Nails Reviewed       Diet Order:   Diet Order            Diet NPO time specified  Diet effective now                 EDUCATION NEEDS:   No education needs have been identified at this time  Skin:  Skin Assessment: Reviewed RN Assessment  Last BM:  6/2  Height:   Ht Readings from Last 1 Encounters:  08/07/20 5\' 7"  (1.702 m)    Weight:   Wt Readings from Last 1 Encounters:  08/09/20 89.6 kg    Ideal Body Weight:  (P) 67.3 kg  BMI:  Body mass index is 30.94 kg/m.  Estimated Nutritional Needs:   Kcal:  2000-2200  Protein:  115-130 grams  Fluid:  > 2 L/day  Lockie Pares., RD, LDN, CNSC See AMiON for contact information

## 2020-08-10 NOTE — Plan of Care (Signed)
Transfer to acute due to medical.

## 2020-08-10 NOTE — Progress Notes (Signed)
Pt transported to CT and back to 4N30 without any complications.

## 2020-08-10 NOTE — Progress Notes (Signed)
SLP Cancellation Note  Patient Details Name: [name] [name] MRN: [id number] DOB: March 03, 1941   Cancelled treatment:       Reason Eval/Treat Not Completed: Patient not medically ready, remains on vent. Will continue to follow.    Osie Bond., M.A. [facility] Acute Rehabilitation Services Pager [phone] Office [phone]  08/10/2020, 7:24 AM

## 2020-08-10 NOTE — Procedures (Signed)
Cortrak  Person Inserting Tube:  [name] [name], RD Tube Type:  Cortrak - 43 inches Tube Location:  Right nare Initial Placement:  Stomach Secured by: Bridle Technique Used to Measure Tube Placement:  Documented cm marking at nare/ corner of mouth Cortrak Secured At:  68 cm    Cortrak Tube Team Note:  Consult received to place a Cortrak feeding tube.   X-ray is required, abdominal x-ray has been ordered by the Cortrak team. Please confirm tube placement before using the Cortrak tube.   If the tube becomes dislodged please keep the tube and contact the Cortrak team at [email or web address] ([email or web address]) for replacement.  If after hours and replacement cannot be delayed, place a NG tube and confirm placement with an abdominal x-ray.    [name] [name] RD, LDN Clinical Nutrition Pager listed in [place]

## 2020-08-10 NOTE — Progress Notes (Signed)
NAME:  [name] [name], MRN:  [id number], DOB:  10-03-1940, LOS: 3 ADMISSION DATE:  08/07/2020, CONSULTATION DATE:  08/07/2020 REFERRING MD:  Estanislado Pandy - NIR, CHIEF COMPLAINT:  AMS - R MCA CVA - Code Stroke  History of Present Illness:  80 year old male with PMHx significant for AFib (on warfarin), thoracic aortic aneurysm, rheumatic pericarditis s/p AVR/MVR and CHF recently admitted for traumatic SDH following a fall while on warfarin. Patient was admitted 5/23 and taken for craniotomy/hematoma evaculation, ultimately discharged to [facility] 5/27 where he has remained. Warfarin continued to be held in CIR with plan to resume 6/3.   On 6/3, patient was noted to be somnolent and Code Stroke was initiated. CT Head revealed a large R MCA CVA. Patient was taken urgently for thrombectomy with TICI 2c revascularization and remained intubated following the case. LLL opacities were noted on CXR prompting coverage for PNA (Unasyn).  PCCM is asked to consult for ventilator management.  Pertinent  Medical History  Traumatic SDH (07/2020, ground level fall, on warfarin)  AFib/flutter Aortic aneurysm  CHF Hemorrhoids TIA  HLD Rheumatic pericarditis  S/p MVR AVR   Significant Hospital Events: Including procedures, antibiotic start and stop dates in addition to other pertinent events   . 6/3 - AMS found to have large R MCA CVA taken to [facility] and admitted to neuro ICU following procedure, remains intubated. WBC 17, LLL opacities -- starting Unasyn . 6/5 - Increased somnolence, CT Head demonstrating 74mm midline shift and increased IC mass effect/edema, hypertonic 3% started. Tolerated short PSV/CPAP wean. . 6/6 - Repeat CT Head  Interim History / Subjective:  Opens eyes to voice this AM, following commands primarily with R side Repeat CT Head 6/5 demonstrated increased edema/mass effect with 85mm midline shift Hypertonic saline 3% initiated Per wife, very interactive with sons yesterday PM, less so today Repeat CT  Head pending  Objective   Blood pressure (!) 146/90, pulse 85, temperature 98.6 F (37 C), temperature source Axillary, resp. rate 18, height 5\' 7"  (1.702 m), weight 89.6 kg, SpO2 99 %.    Vent Mode: PRVC FiO2 (%):  [30 %-40 %] 30 % Set Rate:  [18 bmp] 18 bmp Vt Set:  [540 mL] 540 mL PEEP:  [5 cmH20] 5 cmH20 Plateau Pressure:  [11 cmH20-12 cmH20] 11 cmH20   Intake/Output Summary (Last 24 hours) at 08/10/2020 0918 Last data filed at 08/10/2020 0800 Gross per 24 hour  Intake 2351.07 ml  Output 1875 ml  Net 476.07 ml   Filed Weights   08/07/20 2020 08/09/20 0400  Weight: 84.5 kg 89.6 kg   Physical Examination: General: Acutely ill-appearing older male in NAD. HEENT: [place]/AT, anicteric sclera, PERRL, moist mucous membranes.  Neuro: Lethargic. Responds to verbal stimuli. Following commands consistently, more so with R. Moves all 4 extremities spontaneously. +Cough and +Gag  CV: Irregularly irregular rhythm, no m/g/r. PULM: Breathing even and unlabored on vent (PEEP 5, FiO2 30%). Lung fields CTAB. GI: Soft, nontender, nondistended. Normoactive bowel sounds. Extremities: Trace BLE edema noted. Skin: Warm/dry, no rashes.  Imaging that I have personally reviewed: (right click and "Reselect all SmartList Selections" daily)  6/3 CTH - New large R MCA infarct. Unchanged right cerebral SDH and left parietal extra-axial hemorrhage, unchanged mass effect and 6mm leftward shift   6/3 MRI -  large R MCA infarct, mass effect, 7.41mm R-L midline shift   6/3 ECHO -  LVEF 60-65% severe LVH. Indeterminate diastolic function due to MVR. Normal RV systolic function. Severely dilated  LA mildly dilated RA. Bioprosthetic MV. Bioprosthetic AV. Moderate-severe dilation of aortic root 94mm  6/4 BLE Doppler - no dvt   6/5 CTH - Large R MCA infarct with increased cytotoxic edema and increasing mass effect   6/6 CTH - pending  Resolved Hospital Problem list     Assessment & Plan:   Acute right MCA CVA  s/p thrombectomy with TICI 2c revascularization, mass effect, 7.70mm R-L shift Cytotoxic edema - Per Neuro/NIR - Goal SBP 120-140, Cleviprex available as needed - Continue hypertonic saline 3% while awaiting CTH results - Trend Na - AC plan per Neuro, continue ASA for now - F/u repeat CTH 6/6  Acute respiratory failure requiring mechanical ventilation  In the setting of CVA and procedure. - Continue full vent support at present (4-8cc/kg IBW) - Ongoing attempts at PSV/CPAP, tolerating well - Wean FiO2 for O2 sat > 90% - Daily WUA/SBT - VAP bundle - Pulmonary hygiene - Extubation continues to be limited by mental status (weak cough, unable to stick out tongue/lift head off of bed consistently - Continue to wean as able  Leukocytosis WBC 16, reactive vs. infectious vs. related to blood in brain. Has a LLL opacity, but PCT < 0.1 x 2 and afebrile, covered with Unasyn empirically. No DVT. - Continue Unasyn (short course) given LLL opacity/aspiration risk with stroke - TA negative for growth - Trend WBC, fever curve - Follow intermittent CXR  Hx recent traumatic SDH  [place] with +LOC. Admitted 5/23, taken for evac discharged to [facility] 5/27. Initial plan to resume warfarin 6/3 - SDH remains stable on imaging - AC resumption per Neuro/NSGY  Hx Afib/Aflutter S/p AVR MVR (bioprosthetic) HTN Anticoagulation held following SDH, plan was restart warfarin 6/3 before requiring ICU admission. - AC plan per Neuro - Continue metoprolol (scheduled) - Goal SBP 120-140 - Continue Tikosyn  BPH - Continue Flomax, Proscar - Foley in place  Inadequate PO intake  - TF per RDN, appreciate assistance  Best practice (right click and "Reselect all SmartList Selections" daily)  Diet:  NPO Pain/Anxiety/Delirium protocol (if indicated): Yes (RASS goal 0) VAP protocol (if indicated): Yes DVT prophylaxis: SCD GI prophylaxis: PPI Glucose control:  SSI No Central venous access:   N/A Arterial line:  N/A Foley:  Yes, and it is still needed Mobility:  bed rest  PT consulted: N/A Last date of multidisciplinary goals of care discussion [per primary]  Code Status:  full code Disposition: ICU   Critical care time: 39 minutes   Lestine Mount, PA-C [facility] 08/10/20 9:49 AM  Please see [email or web address] for pager details.  From 7A-7P if no response, please call [phone] After hours, please call ELink [phone]

## 2020-08-10 NOTE — Progress Notes (Addendum)
Referring Physician(s): Code stroke- Bhagat, Dutch Quint (neurology)  Supervising Physician: Luanne Bras  Patient Status:  [facility] - In-pt  Chief Complaint:  Stroke  Brief History:  [name] [name] is s/p acute CVA.  He underwent cerebral arteriogram with emergent mechanical thrombectomy of right ICA terminus occlusion achieving a TICI 2c revascularization via right femoral approach 08/07/2020 by Dr. Estanislado Pandy.  Subjective:  Remains intubated. Wife at bedside. Wife and RN report patient is moving all 61's.  Allergies: Naproxen and No healthtouch food allergies  Medications: Prior to Admission medications   Medication Sig Start Date End Date Taking? Authorizing Provider  atorvastatin (LIPITOR) 80 MG tablet TAKE 1 TABLET (80 MG TOTAL) BY MOUTH EVERY EVENING. Patient taking differently: Take 80 mg by mouth daily. 04/07/20  Yes Larey Dresser, MD  clobetasol cream (TEMOVATE) 0.62 % Apply 1 application topically daily as needed (for skin irritation).  05/20/14  Yes [provider]  Coenzyme Q10 (CO Q-10) 300 MG CAPS Take 300 mg by mouth daily.   Yes [provider]  Cyanocobalamin (VITAMIN B-12) 5000 MCG SUBL Place 5,000 mcg under the tongue daily at 2 PM.    Yes [provider]  docusate sodium (COLACE) 100 MG capsule Take 100 mg by mouth daily.   Yes [provider]  dofetilide (TIKOSYN) 500 MCG capsule Take 1 capsule (500 mcg total) by mouth 2 (two) times daily. 03/26/20  Yes Larey Dresser, MD  finasteride (PROSCAR) 5 MG tablet Take 5 mg by mouth daily.   Yes [provider]  furosemide (LASIX) 20 MG tablet Take 1 tablet (20 mg total) by mouth daily. 03/26/20  Yes Larey Dresser, MD  Glucosamine-Chondroitin (COSAMIN DS PO) Take 1 tablet by mouth 2 (two) times daily.   Yes [provider]  loratadine (CLARITIN) 10 MG tablet Take 10 mg by mouth daily as needed for allergies.    Yes [provider]  losartan (COZAAR) 25  MG tablet Take 1 tablet (25 mg total) by mouth daily. 03/26/20  Yes Larey Dresser, MD  metoprolol succinate (TOPROL-XL) 50 MG 24 hr tablet Take 75 mg (1.5 tabs) by mouth twice daily. Take with or immediately following a meal. Patient taking differently: Take 75 mg by mouth in the morning and at bedtime. 05/22/20  Yes Larey Dresser, MD  Multiple Vitamins-Minerals (MULTIVITAL-M) TABS Take 1 tablet by mouth daily.    Yes [provider]  potassium chloride SA (KLOR-CON) 20 MEQ tablet Take 1 tablet (20 mEq total) by mouth daily. 03/26/20  Yes Larey Dresser, MD  Probiotic Product (PROBIOTIC FORMULA PO) Take 1 capsule by mouth daily.   Yes [provider]  tamsulosin (FLOMAX) 0.4 MG CAPS capsule Take 0.4 mg by mouth 2 (two) times daily. 09/14/18  Yes [provider]  warfarin (COUMADIN) 5 MG tablet As directed Patient taking differently: Take 5 mg by mouth See admin instructions. 1 tablet everyday with an addition of 1 1/2 tablet on MWF and Saturday. 03/26/20  Yes Larey Dresser, MD  COVID-19 mRNA vaccine, Moderna, 100 MCG/0.5ML injection Inject into the muscle. 06/26/20   Carlyle Basques, MD     Vital Signs: BP 124/68   Pulse 62   Temp 98.2 F (36.8 C) (Axillary)   Resp 18   Ht 5\' 7"  (1.702 m)   Wt 89.6 kg   SpO2 99%   BMI 30.94 kg/m   Physical Exam Constitutional:      Appearance: He is ill-appearing.  Comments: Raises head. Remains intubated. Attempting to wean vent.  HENT:     Head: Normocephalic.  Eyes:     Comments: Opens eyes to voice  Cardiovascular:     Rate and Rhythm: Normal rate.  Abdominal:     Palpations: Abdomen is soft.  Musculoskeletal:     Comments: Puncture site looks good  Skin:    General: Skin is warm and dry.  Neurological:     Comments: Moves all 4's per nurse and wife. I only witnessed him open his eyes.     Imaging: CT HEAD WO CONTRAST  Result Date: 08/10/2020 CLINICAL DATA:  Right MCA territory infarction.  Bilateral subdural hematomas. EXAM: CT HEAD WITHOUT CONTRAST TECHNIQUE: Contiguous axial images were obtained from the base of the skull through the vertex without intravenous contrast. COMPARISON:  CT head without contrast 08/09/2020. MR head without contrast 08/07/2020 FINDINGS: Brain: Mass effect associated with the right MCA territory infarct is decreasing. Midline shift still measures 8 mm. Bilateral extra-axial collections remain. Left parietal infarct and extra-axial hemorrhage is stable. Left scalp contusion stable. No new infarct is present. Involvement of the right basal ganglia is stable. No new hemorrhage is present. Vascular: Atherosclerotic calcifications are present within the cavernous internal carotid arteries bilaterally. Right MCA branch hyperdensities again noted. Skull: Right frontal craniotomy.  Calvarium otherwise intact. 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 Fluid is present in the nasopharynx. Bilateral lens replacements are noted. Globes and orbits are otherwise unremarkable. IMPRESSION: 1. Decreased mass effect associated with the right MCA territory infarct. 2. Stable bilateral extra-axial collections. 3. Stable left parietal infarct and extra-axial hemorrhage. 4. No new infarct. Electronically Signed   By: San Morelle M.D.   On: 08/10/2020 10:53   CT HEAD WO CONTRAST  Result Date: 08/09/2020 CLINICAL DATA:  80 year old male with altered mental status. Right MCA infarct with right ICA occlusion in the neck on CTA, occluded right MCA M2 branch. Status post endovascular reperfusion on 08/07/2020. EXAM: CT HEAD WITHOUT CONTRAST TECHNIQUE: Contiguous axial images were obtained from the base of the skull through the vertex without intravenous contrast. COMPARISON:  Posttreatment brain MRI 08/07/2020. Pretreatment head CT 08/07/2020. FINDINGS: Brain: Pre-existing right greater than left subdural hematomas, with lobulated blood  along the left lateral parieto-occipital junction. Trace IVH was also present prior to treatment. Relatively large right MCA territory infarct with progressed cytotoxic edema and parenchymal heterogeneity from the prior CT. No malignant hemorrhagic transformation identified, but increasing intracranial mass effect now with leftward midline shift up to 9 mm (previously 4 mm series 3, image 18 today). Effaced right lateral ventricle. Stable left lateral ventricle size. Basilar cisterns remain patent. Stable gray-white matter differentiation elsewhere, including periventricular hypodensity and focal encephalomalacia underlying the left posterior convexity extra-axial blood. Vascular: Calcified atherosclerosis at the skull base. Skull: Recent right side craniotomy. No acute osseous abnormality identified. Sinuses/Orbits: Increased paranasal sinus fluid levels and opacification, intubated on the scout view. Tympanic cavities and mastoids remain clear. Other: Sequelae of recent craniotomy, skin staples in place. Orbits soft tissues appear stable and negative. IMPRESSION: 1. Evolved relatively large right MCA infarcts since 08/07/2020 with increased intracranial mass effect and increased leftward midline shift now 9 mm. 2. No malignant hemorrhagic transformation. Pre-existing right greater than left subdural hematomas and trace IVH. Electronically Signed   By: Genevie Ann M.D.   On: 08/09/2020 09:32   CT HEAD WO CONTRAST  Addendum Date: 08/07/2020   ADDENDUM REPORT: 08/07/2020 09:36  ADDENDUM: These results were called by telephone at the time of interpretation on 08/07/2020 at 9:36 am to provider Dr. Tessa Lerner, who verbally acknowledged these results. Electronically Signed   By: Kellie Simmering DO   On: 08/07/2020 09:36   Result Date: 08/07/2020 CLINICAL DATA:  Mental status change, unknown cause. Additional history provided: Patient nonverbal and not moving left side, last known normal 8 p.m. last night. EXAM: CT HEAD WITHOUT  CONTRAST TECHNIQUE: Contiguous axial images were obtained from the base of the skull through the vertex without intravenous contrast. COMPARISON:  Prior head CT examinations 08/03/2020 and earlier. FINDINGS: Brain: New from the prior examination of 08/03/2020, there is a large acute cortical/subcortical infarct within the right middle cerebral artery vascular territory, affecting the right frontal, parietal and temporal lobes as well as right insula and lateral right occipital lobe. Unchanged subdural hemorrhage overlying the right cerebral hemisphere. Continued interval decrease in conspicuity of interhemispheric subdural and subarachnoid hemorrhage. Unchanged size of an extra-axial hemorrhage overlying the left parietal lobe, again measuring up to 10 mm in thickness (remeasured on prior). Persistent trace intraventricular hemorrhage. No evidence of hydrocephalus at this time. Unchanged mass effect with 4 mm leftward midline shift measured at the level of the septum pellucidum. Redemonstrated chronic left parietal temporal infarct with ex vacuo dilatation of the posterior left lateral ventricle. Persistent although decreased pneumocephalus overlying the right cerebral hemisphere. Vascular: Asymmetric hyperdensity of the M1 and M2 right middle cerebral artery vessels, likely reflecting endoluminal thrombus. Atherosclerotic calcifications. Skull: Right-sided cranioplasty with overlying scalp staples. Sinuses/Orbits: Visualized orbits show no acute finding. Frothy secretions and moderate mucosal thickening within the right maxillary sinus. Trace bilateral ethmoid sinus mucosal thickening. IMPRESSION: 1. 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 Asymmetric hyperdensity of the M1 and M2 right middle cerebral artery vessels, likely reflecting endoluminal thrombus. 2. Unchanged subdural hemorrhage overlying the right  cerebral hemisphere. 3. Continued interval decrease in conspicuity of interhemispheric subdural and subarachnoid hemorrhage. 4. Unchanged size of an extra-axial hemorrhage overlying the left parietal lobe, again measuring up to 10 mm in thickness. 5. Persistent trace intraventricular hemorrhage. No evidence of hydrocephalus. 6. Unchanged mass effect with 4 mm leftward midline shift. 7. Right maxillary sinusitis. Electronically Signed: By: Kellie Simmering DO On: 08/07/2020 09:32   MR ANGIO HEAD WO CONTRAST  Result Date: 08/07/2020 CLINICAL DATA:  Follow-up stroke.  Right MCA territory infarction EXAM: MRI HEAD WITHOUT CONTRAST MRA HEAD WITHOUT CONTRAST TECHNIQUE: Multiplanar, multi-echo pulse sequences of the brain and surrounding structures were acquired without intravenous contrast. Angiographic images of the Circle of Willis were acquired using MRA technique without intravenous contrast. COMPARISON: No pertinent prior exam. COMPARISON:  CT studies earlier today. FINDINGS: MRI HEAD FINDINGS Brain: Diffusion imaging shows confluent acute infarction within the right middle cerebral artery territory with some sparing of the anterior insula and frontal operculum and only spotty involvement the caudate and putamen. Mild swelling of the infarction with minimal petechial blood products but no frank hematoma. No focal abnormality affects the brainstem or cerebellum. There has been right parietal craniotomy for evacuation of a large right subdural hematoma. Small amount subdural blood does persist, maximal thickness in the frontal region 8 mm. Small amount of subdural blood also in the left parietal and occipital region, maximal thickness 1 cm without significant mass effect. Right-to-left midline shift presently of 7.5 mm. Mild chronic small-vessel ischemic changes of the white matter. Old infarction at  the left parietooccipital junction with chronic volume loss. Small amount of hemorrhagic contusion in this region. Small  amount of blood dependent within the occipital horns of the lateral ventricles, without evidence of obstructive hydrocephalus. Vascular: Major vessels at the base of the brain show flow. Skull and upper cervical spine: No primary calvarial finding. Sinuses/Orbits: Rhinosinusitis of the right maxillary sinus. Orbits negative. Other: Left parietal scalp bruising. MRA HEAD FINDINGS Anterior circulation: Both internal carotid arteries widely patent through the skull base and siphon regions. The anterior and middle cerebral vessels are patent. No evidence of large or medium vessel occlusion in the right MCA territory. Probable luxury perfusion throughout the right MCA territory. Azygos anterior cerebral artery incidentally noted. Posterior circulation: Both vertebral arteries widely patent to the basilar. No basilar stenosis. Posterior circulation branch vessels are patent. Anatomic variants: None other significant. IMPRESSION: Confluent acute infarction throughout the majority of the right middle cerebral artery territory, with some sparing of the anterior insula and frontal operculum. Spotty involvement of the right caudate and putamen. Swelling and petechial blood products but no frank hematoma. Mass effect with right-to-left shift of 7.5 mm. Recent right parietal craniectomy for evacuation of subdural hematoma. Residual subdural blood and fluid on the right, maximal thickness 8 mm. Residual subdural blood in the left parieto-occipital region, maximal thickness 1 cm. Small amount of blood dependent in the occipital horns of the lateral ventricles without evidence of change in ventricular size. Small hemorrhagic contusion in the region of subacute infarction in the right parietooccipital junction. MR angiography negative for large or medium vessel occlusion presently. Luxury perfusion in the MCA branches. Electronically Signed   By: Nelson Chimes M.D.   On: 08/07/2020 16:40   MR BRAIN WO CONTRAST  Result Date:  08/07/2020 CLINICAL DATA:  Follow-up stroke.  Right MCA territory infarction EXAM: MRI HEAD WITHOUT CONTRAST MRA HEAD WITHOUT CONTRAST TECHNIQUE: Multiplanar, multi-echo pulse sequences of the brain and surrounding structures were acquired without intravenous contrast. Angiographic images of the Circle of Willis were acquired using MRA technique without intravenous contrast. COMPARISON: No pertinent prior exam. COMPARISON:  CT studies earlier today. FINDINGS: MRI HEAD FINDINGS Brain: Diffusion imaging shows confluent acute infarction within the right middle cerebral artery territory with some sparing of the anterior insula and frontal operculum and only spotty involvement the caudate and putamen. Mild swelling of the infarction with minimal petechial blood products but no frank hematoma. No focal abnormality affects the brainstem or cerebellum. There has been right parietal craniotomy for evacuation of a large right subdural hematoma. Small amount subdural blood does persist, maximal thickness in the frontal region 8 mm. Small amount of subdural blood also in the left parietal and occipital region, maximal thickness 1 cm without significant mass effect. Right-to-left midline shift presently of 7.5 mm. Mild chronic small-vessel ischemic changes of the white matter. Old infarction at the left parietooccipital junction with chronic volume loss. Small amount of hemorrhagic contusion in this region. Small amount of blood dependent within the occipital horns of the lateral ventricles, without evidence of obstructive hydrocephalus. Vascular: Major vessels at the base of the brain show flow. Skull and upper cervical spine: No primary calvarial finding. Sinuses/Orbits: Rhinosinusitis of the right maxillary sinus. Orbits negative. Other: Left parietal scalp bruising. MRA HEAD FINDINGS Anterior circulation: Both internal carotid arteries widely patent through the skull base and siphon regions. The anterior and middle cerebral  vessels are patent. No evidence of large or medium vessel occlusion in the right MCA territory. Probable luxury  perfusion throughout the right MCA territory. Azygos anterior cerebral artery incidentally noted. Posterior circulation: Both vertebral arteries widely patent to the basilar. No basilar stenosis. Posterior circulation branch vessels are patent. Anatomic variants: None other significant. IMPRESSION: Confluent acute infarction throughout the majority of the right middle cerebral artery territory, with some sparing of the anterior insula and frontal operculum. Spotty involvement of the right caudate and putamen. Swelling and petechial blood products but no frank hematoma. Mass effect with right-to-left shift of 7.5 mm. Recent right parietal craniectomy for evacuation of subdural hematoma. Residual subdural blood and fluid on the right, maximal thickness 8 mm. Residual subdural blood in the left parieto-occipital region, maximal thickness 1 cm. Small amount of blood dependent in the occipital horns of the lateral ventricles without evidence of change in ventricular size. Small hemorrhagic contusion in the region of subacute infarction in the right parietooccipital junction. MR angiography negative for large or medium vessel occlusion presently. Luxury perfusion in the MCA branches. Electronically Signed   By: Nelson Chimes M.D.   On: 08/07/2020 16:40   Portable Chest x-ray  Result Date: 08/07/2020 CLINICAL DATA:  Status post ET tube placement. EXAM: PORTABLE CHEST 1 VIEW COMPARISON:  Jul 27, 2020. FINDINGS: Endotracheal tube tip is at the level of the clavicular heads. Gastric tube courses below the diaphragm with the tip outside the field of view. The side port is approximately above the gastroesophageal junction. Similar left basilar opacities. No visible pneumothorax or large pleural effusion on this single AP semi erect radiograph. Similar left basilar opacities. Cardiomediastinal silhouette is similar.  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 Aortic atherosclerosis. IMPRESSION: 1. Endotracheal tube tip is at the level of the clavicular heads. 2. Gastric tube courses below the diaphragm with the tip outside the field of view and the side port 3 cm above the gastroesophageal junction. Recommend advancement if intragastric position of the side port is desired. 3. Similar left basilar opacities, which could represent atelectasis or pneumonia. Electronically Signed   By: Margaretha Sheffield MD   On: 08/07/2020 13:02   DG Abd Portable 1V  Result Date: 08/07/2020 CLINICAL DATA:  Gastric tube placement EXAM: PORTABLE ABDOMEN - 1 VIEW COMPARISON:  None. FINDINGS: NG tube in the stomach with the tip in the lateral body of the stomach. Normal bowel gas pattern. Contrast in the renal collecting system from proceeding angiography. Normal bowel gas pattern. IMPRESSION: NG tube in the stomach. Electronically Signed   By: Franchot Gallo M.D.   On: 08/07/2020 14:10   ECHOCARDIOGRAM COMPLETE  Result Date: 08/07/2020    ECHOCARDIOGRAM REPORT   Patient Name:   [name] [name] Date of Exam: 08/07/2020 Medical Rec #:  [id number]     Height:       68.0 in Accession #:    [id number]    Weight:       204.4 lb Date of Birth:  December 15, 1940     BSA:          2.063 m Patient Age:    42 years      BP:           116/80 mmHg Patient Gender: M             HR:           70 bpm. Exam Location:  Inpatient Procedure: 2D Echo, Cardiac Doppler, Color Doppler and Intracardiac            Opacification Agent Indications:    Stroke  History:        Patient has prior history of Echocardiogram examinations, most                 recent 02/22/2018. 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 Hx rheumatic heart                 disease. S/p AVR. S/p MVR.  Sonographer:    Clayton Lefort RDCS (AE) Referring Phys: [id number] Lorenza Chick  Sonographer Comments: Suboptimal apical window. IMPRESSIONS  1. Left  ventricular ejection fraction, by estimation, is 60 to 65%. The left ventricle has normal function. The left ventricle has no regional wall motion abnormalities. There is severe concentric left ventricular hypertrophy. Diastolic function indeterminant due to mitral valve repair.  2. Right ventricular systolic function is normal. The right ventricular size is normal.  3. Left atrial size was severely dilated.  4. Right atrial size was mildly dilated.  5. The mitral valve has been repaired/replaced. A bioprosthetic mitral valve is present. The valve appears well seated with normal function by doppler interrogation. Mean gradient 57mmHg at HR 67bpm. There is no mitral valve regurgitation.  6. The aortic valve has been repaired/replaced. A bioprosthetic aortic valve is present and appears well seated with normal function by doppler interrogation. Mean gradient 73mmHg, peak gradient 84mmHg, Vmax 2.37, DI 0.4. Trace aortic regurgitation visualized.  7. Aortic dilatation noted. 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 Comparison(s): Compared to prior TTE in our system (2019), the aortic root now measures 35mm (previously 68mm) and the ascending aorta now measures 26mm (previously 75mm). Notably, the patient has had subsequent TTEs after 2019 at [facility] clinic. FINDINGS  Left Ventricle: Left ventricular ejection fraction, by estimation, is 60 to 65%. The left ventricle has normal function. The left ventricle has no regional wall motion abnormalities. Definity contrast agent was given IV to delineate the left ventricular  endocardial borders. The left ventricular internal cavity size was normal in size. There is severe concentric left ventricular hypertrophy. Diastolic function indeterminant due to mitral valve repair. Right Ventricle: The right ventricular size is normal. No increase in right ventricular wall  thickness. Right ventricular systolic function is normal. Left Atrium: Left atrial size was severely dilated. Right Atrium: Right atrial size was mildly dilated. Pericardium: There is no evidence of pericardial effusion. Mitral Valve: The mitral valve has been repaired/replaced. A bioprosthetic mitral valve is present. The valve appears well seated with normal function by doppler interrogation. Mean gradient 43mmHg at HR 67bpm. There is no mitral valve regurgitation. Mean  gradient 52mmHg at HR 67bpm. No mitral regurgitation mitral valve regurgitation. MV peak gradient, 12.0 mmHg. The mean mitral valve gradient is 4.5 mmHg. Tricuspid Valve: The tricuspid valve is normal in structure. Tricuspid valve regurgitation is trivial. Aortic Valve: The aortic valve has been repaired/replaced. Aortic valve regurgitation is trivial. A bioprosthetic aortic valve is present and appears well seated with normal function by doppler interrogation.Aortic valve mean gradient measures 12.0 mmHg.  Aortic valve peak gradient measures 21.0 mmHg. Aortic valve area, by VTI measures 1.67 cm. Pulmonic Valve: The pulmonic valve was normal in structure. Pulmonic valve regurgitation is trivial. Aorta: Aortic dilatation noted. 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  Venous: IVC assessment for right atrial pressure unable to be performed due to mechanical ventilation. IAS/Shunts: No atrial level shunt detected by color flow Doppler.  LEFT VENTRICLE PLAX 2D LVIDd:         3.90 cm LVIDs:         2.70 cm LV PW:         1.80 cm LV IVS:        2.00 cm LVOT diam:     2.40 cm LV SV:         76 LV SV Index:   37 LVOT Area:     4.52 cm  RIGHT VENTRICLE             IVC RV Basal diam:  3.40 cm     IVC diam: 2.00 cm RV S prime:     12.50 cm/s TAPSE (M-mode): 1.5 cm LEFT ATRIUM            Index       RIGHT ATRIUM           Index LA diam:      5.00  cm  2.42 cm/m  RA Area:     22.30 cm LA Vol (A2C): 88.8 ml  43.05 ml/m RA Volume:   68.00 ml  32.97 ml/m LA Vol (A4C): 126.0 ml 61.09 ml/m  AORTIC VALVE AV Area (Vmax):    1.83 cm AV Area (Vmean):   1.80 cm AV Area (VTI):     1.67 cm AV Vmax:           229.25 cm/s AV Vmean:          162.250 cm/s AV VTI:            0.453 m AV Peak Grad:      21.0 mmHg AV Mean Grad:      12.0 mmHg LVOT Vmax:         92.77 cm/s LVOT Vmean:        64.667 cm/s LVOT VTI:          0.168 m LVOT/AV VTI ratio: 0.37  AORTA Ao Root diam: 4.90 cm Ao Asc diam:  4.90 cm MITRAL VALVE            TRICUSPID VALVE MV Area VTI:  1.64 cm  TR Peak grad:   15.2 mmHg MV Peak grad: 12.0 mmHg TR Vmax:        195.00 cm/s MV Mean grad: 4.5 mmHg MV Vmax:      1.74 m/s  SHUNTS MV Vmean:     97.3 cm/s Systemic VTI:  0.17 m                         Systemic Diam: 2.40 cm Gwyndolyn Kaufman MD Electronically signed by Gwyndolyn Kaufman MD Signature Date/Time: 08/07/2020/6:59:58 PM    Final    VAS [place] LOWER EXTREMITY VENOUS (DVT)  Result Date: 08/09/2020  Lower Venous DVT Study Patient Name:  [name] [name]  Date of Exam:   08/08/2020 Medical Rec #: [id number]      Accession #:    [id number] Date of Birth: 1940/10/01      Patient Gender: M Patient Age:   79Y Exam Location:  [facility] Procedure:      VAS [place] LOWER EXTREMITY VENOUS (DVT) Referring Phys: [id number] GRACE E BOWSER --------------------------------------------------------------------------------  Indications: Stroke.  Comparison Study: No prior Performing Technologist: Oda Cogan RDMS, RVT  Examination Guidelines: A complete evaluation includes  B-mode imaging, spectral Doppler, color Doppler, and power Doppler as needed of all accessible portions of each vessel. Bilateral testing is considered an integral part of a complete examination. Limited examinations for reoccurring indications may be performed as noted. The reflux portion of the exam is performed with the patient in reverse  Trendelenburg.  +---------+---------------+---------+-----------+----------+--------------+ RIGHT    CompressibilityPhasicitySpontaneityPropertiesThrombus Aging +---------+---------------+---------+-----------+----------+--------------+ CFV      Full           Yes      Yes                                 +---------+---------------+---------+-----------+----------+--------------+ SFJ      Full                                                        +---------+---------------+---------+-----------+----------+--------------+ FV Prox  Full                                                        +---------+---------------+---------+-----------+----------+--------------+ FV Mid   Full                                                        +---------+---------------+---------+-----------+----------+--------------+ FV DistalFull                                                        +---------+---------------+---------+-----------+----------+--------------+ PFV      Full                                                        +---------+---------------+---------+-----------+----------+--------------+ POP      Full           Yes      Yes                                 +---------+---------------+---------+-----------+----------+--------------+ PTV      Full                                                        +---------+---------------+---------+-----------+----------+--------------+ PERO     Full                                                        +---------+---------------+---------+-----------+----------+--------------+   +---------+---------------+---------+-----------+----------+--------------+  LEFT     CompressibilityPhasicitySpontaneityPropertiesThrombus Aging +---------+---------------+---------+-----------+----------+--------------+ CFV      Full           Yes      Yes                                  +---------+---------------+---------+-----------+----------+--------------+ SFJ      Full                                                        +---------+---------------+---------+-----------+----------+--------------+ FV Prox  Full                                                        +---------+---------------+---------+-----------+----------+--------------+ FV Mid   Full                                                        +---------+---------------+---------+-----------+----------+--------------+ FV DistalFull                                                        +---------+---------------+---------+-----------+----------+--------------+ PFV      Full                                                        +---------+---------------+---------+-----------+----------+--------------+ POP      Full           Yes      Yes                                 +---------+---------------+---------+-----------+----------+--------------+ PTV      Full                                                        +---------+---------------+---------+-----------+----------+--------------+ PERO     Full                                                        +---------+---------------+---------+-----------+----------+--------------+     Summary: BILATERAL: - No evidence of deep vein thrombosis seen in the lower extremities, bilaterally. -No evidence of popliteal cyst, bilaterally.   *See table(s) above for measurements and observations. Electronically signed by Ruta Hinds MD on 08/09/2020 at 5:11:11 PM.  Final    [place] EKG SITE RITE  Result Date: 08/09/2020 If Site Rite image not attached, placement could not be confirmed due to current cardiac rhythm.  CT ANGIO HEAD NECK W WO CM W PERF (CODE STROKE)  Addendum Date: 08/07/2020   ADDENDUM REPORT: 08/07/2020 10:13 ADDENDUM: These results were called by telephone at the time of interpretation on 08/07/2020 at 10:05 am to  provider [facility] , who verbally acknowledged these results. Electronically Signed   By: Kellie Simmering DO   On: 08/07/2020 10:13   Result Date: 08/07/2020 CLINICAL DATA:  Mental status change, unknown cause. EXAM: CT ANGIOGRAPHY HEAD AND NECK CT PERFUSION BRAIN TECHNIQUE: Multidetector CT imaging of the head and neck was performed using the standard protocol during bolus administration of intravenous contrast. Multiplanar CT image reconstructions and MIPs were obtained to evaluate the vascular anatomy. Carotid stenosis measurements (when applicable) are obtained utilizing NASCET criteria, using the distal internal carotid diameter as the denominator. Multiphase CT imaging of the brain was performed following IV bolus contrast injection. Subsequent parametric perfusion maps were calculated using RAPID software. CONTRAST:  129mL OMNIPAQUE IOHEXOL 350 MG/ML SOLN COMPARISON:  Noncontrast head CT examinations 08/07/2020 and earlier. MRA neck 08/06/2013, MRA head 08/06/2013. FINDINGS: CTA NECK FINDINGS Aortic arch: The visualized ascending thoracic aorta measures up to 4 mm in diameter. Standard aortic branching. Atherosclerotic plaque within the visualized aortic arch and proximal major branch vessels of the neck. No hemodynamically significant innominate or proximal subclavian artery stenosis. Right carotid system: The CCA is patent to the bifurcation. Mild to moderate calcified plaque within the distal CCA and carotid bifurcation. The ICA becomes occluded shortly beyond its origin and remains occluded throughout the remainder of the neck. Left carotid system: CCA and ICA patent within the neck without stenosis. Minimal calcified plaque within the carotid bifurcation. Tortuosity of the distal cervical ICA. Vertebral arteries: Vertebral arteries patent within the neck. Nonstenotic calcified plaque at the origin of the right vertebral artery. Calcified plaque results in moderate stenosis at the origin of the left  vertebral artery. Skeleton: Cervical spondylosis. No acute bony abnormality or aggressive osseous lesion. Other neck: No neck mass or cervical lymphadenopathy. Thyroid unremarkable. Upper chest: No consolidation within the imaged lung apices. Prior median sternotomy. Review of the MIP images confirms the above findings CTA HEAD FINDINGS Anterior circulation: The right ICA remains occluded intracranially with the exception of the very distal right ICA terminus. The M1 middle cerebral artery is patent, likely due to cross flow via the anterior communicating artery and right A1 segment. However, there is occlusion of a proximal M2 right MCA vessel (series 11, image 20) (series 12, image 19). The intracranial left ICA is patent. Mild calcified plaque within this vessel with no more than mild stenosis. The M1 left middle cerebral artery is patent. No left M2 proximal branch occlusion or proximal high-grade arterial stenosis. The anterior cerebral arteries are patent. Strongly dominant right anterior cerebral artery which supplies the bilateral ACA territories distally. No intracranial aneurysm is identified. Posterior circulation: The intracranial vertebral arteries are patent. The basilar artery is patent. Apparent flow gaps within a right PCA P3/P4 branch, which may reflect short segment occlusions or severe stenoses. The left PCA is patent. Posterior communicating arteries are hypoplastic or absent bilaterally. Venous sinuses: The dural venous sinuses are poorly assessed due to contrast timing. Anatomic variants: As described Review of the MIP images confirms the above findings CT Brain Perfusion Findings: Please note the reliability of the perfusion  data is limited due to bolus quality and motion degradation. CBF (<30%) Volume: 42mL Perfusion (Tmax>6.0s) volume: 12mL (predominantly within the right MCA vascular territory). Mismatch Volume: 183mL Infarction Location:Right MCA vascular territory These results were  called by telephone at the time of interpretation on 08/07/2020 at 9:37 am to provider Dr. Tessa Lerner, who verbally acknowledged these results. IMPRESSION: CTA neck: 1. The right internal carotid artery becomes occluded shortly beyond its origin and remains occluded throughout the remainder of the neck. 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 2. The left common carotid and internal carotid arteries are patent within the neck without stenosis. Mild calcified plaque within the left carotid bifurcation. 3. Vertebral arteries patent within the neck. Moderate atherosclerotic narrowing at the origin of the left vertebral artery. 4. The partially imaged ascending thoracic aorta measures up to 4 cm in diameter. Nonemergent CTA of the chest is recommended to more fully assess the degree of acid ending thoracic aortic dilation, and to determine appropriate follow-up. CTA head: 1. The intracranial right internal carotid artery is occluded with the exception of the very distal right ICA terminus. 2. The M1 right middle cerebral artery is patent, likely due to cross flow via the anterior communicating artery and right A1 segment. However, there is occlusion of a proximal right M2 MCA vessel. 3. Apparent flow gaps within a right PCA P3/P4 branch, which may reflect short-segment occlusions or high-grade stenoses. CT perfusion head: 1. Please note the reliability of the perfusion data is limited due to bolus quality and motion degradation. 2. The perfusion software identifies a 45 mL core infarct within the right MCA vascular territory. However, the size of the core infarct is likely underestimated when correlating with the concurrently performed noncontrast head CT. 3. The perfusion software identifies a 162 mL region of hypoperfusion predominantly within the right MCA vascular territory. Reported mismatch volume: 117 mL. Electronically Signed: By: Kellie Simmering DO On: 08/07/2020 10:03    Labs:  CBC: Recent Labs     08/01/20 0610 08/07/20 0854 08/07/20 1337 08/08/20 0625 08/09/20 0451  WBC 13.4* 17.7*  --  17.5* 16.4*  HGB 10.4* 13.1 12.2* 12.3* 11.6*  HCT 31.1* 38.3* 36.0* 36.7* 34.6*  PLT 214 311  --  254 229    COAGS: Recent Labs    07/26/20 1227 07/27/20 1833 07/28/20 0141 07/29/20 0157  INR 3.4* 3.4* 1.4* 1.1  APTT  --  45*  --   --     BMP: Recent Labs    08/07/20 0854 08/07/20 1337 08/08/20 0625 08/09/20 0451 08/09/20 1039 08/09/20 1611 08/09/20 2158 08/10/20 0553 08/10/20 0905  NA 134* 136 134* 135   < > 140 141 142 143  K 4.1 4.0 3.8 3.7  --   --   --  3.9  --   CL 104  --  102 106  --   --   --  115*  --   CO2 24  --  24 24  --   --   --  23  --   GLUCOSE 116*  --  114* 144*  --   --   --  166*  --   BUN 29*  --  25* 20  --   --   --  21  --   CALCIUM 8.8*  --  8.1* 8.1*  --   --   --  8.0*  --   CREATININE 0.92  --  0.93 0.81  --   --   --  0.69  --   GFRNONAA >60  --  >60 >60  --   --   --  >60  --    < > = values in this interval not displayed.    LIVER FUNCTION TESTS: Recent Labs    07/27/20 1833 08/01/20 0610  BILITOT 0.9 1.1  AST 34 39  ALT 33 52*  ALKPHOS 65 46  PROT 7.0 5.9*  ALBUMIN 3.8 2.8*    Assessment and Plan:  Acute CVA s/p cerebral arteriogram with emergent mechanical thrombectomy of right ICA terminus occlusion achieving a TICI 2c revascularization via right femoral approach 08/07/2020 by Dr. Estanislado Pandy.  Further plans per neurology/CCM- appreciate and agree with management.  NIR to follow.   Electronically Signed: Murrell Redden, PA-C 08/10/2020, 2:32 PM    I spent a total of 15 Minutes at the the patient's bedside AND on the patient's hospital floor or unit, greater than 50% of which was counseling/coordinating care for f/u stroke with revasc.

## 2020-08-11 DIAGNOSIS — E785 Hyperlipidemia, unspecified: Secondary | ICD-10-CM

## 2020-08-11 LAB — GLUCOSE, CAPILLARY
Glucose-Capillary: 130 mg/dL — ABNORMAL HIGH (ref 70–99)
Glucose-Capillary: 132 mg/dL — ABNORMAL HIGH (ref 70–99)
Glucose-Capillary: 150 mg/dL — ABNORMAL HIGH (ref 70–99)
Glucose-Capillary: 179 mg/dL — ABNORMAL HIGH (ref 70–99)
Glucose-Capillary: 186 mg/dL — ABNORMAL HIGH (ref 70–99)
Glucose-Capillary: 187 mg/dL — ABNORMAL HIGH (ref 70–99)

## 2020-08-11 LAB — SODIUM
Sodium: 147 mmol/L — ABNORMAL HIGH (ref 135–145)
Sodium: 150 mmol/L — ABNORMAL HIGH (ref 135–145)
Sodium: 151 mmol/L — ABNORMAL HIGH (ref 135–145)
Sodium: 151 mmol/L — ABNORMAL HIGH (ref 135–145)

## 2020-08-11 MED ORDER — SENNOSIDES-DOCUSATE SODIUM 8.6-50 MG PO TABS
1.0000 | ORAL_TABLET | Freq: Two times a day (BID) | ORAL | Status: DC
  Administered 2020-08-11 – 2020-08-13 (×6): 1
  Filled 2020-08-11 (×8): qty 1

## 2020-08-11 NOTE — Progress Notes (Signed)
NAME:  [name] [name], MRN:  [id number], DOB:  [birth year], LOS: 4 ADMISSION DATE:  08/07/2020, CONSULTATION DATE:  08/07/2020 REFERRING MD:  Estanislado Pandy - NIR, CHIEF COMPLAINT:  AMS - R MCA CVA - Code Stroke  History of Present Illness:  80 year old male with PMHx significant for AFib (on warfarin), thoracic aortic aneurysm, rheumatic pericarditis s/p AVR/MVR and CHF recently admitted for traumatic SDH following a fall while on warfarin. Patient was admitted 5/23 and taken for craniotomy/hematoma evaculation, ultimately discharged to [facility] 5/27 where he has remained. Warfarin continued to be held in CIR with plan to resume 6/3.   On 6/3, patient was noted to be somnolent and Code Stroke was initiated. CT Head revealed a large R MCA CVA. Patient was taken urgently for thrombectomy with TICI 2c revascularization and remained intubated following the case. LLL opacities were noted on CXR prompting coverage for PNA (Unasyn).  PCCM is asked to consult for ventilator management.  Pertinent  Medical History  Traumatic SDH (07/2020, ground level fall, on warfarin)  AFib/flutter Aortic aneurysm  CHF Hemorrhoids TIA  HLD Rheumatic pericarditis  S/p MVR AVR   Significant Hospital Events: Including procedures, antibiotic start and stop dates in addition to other pertinent events   . 6/3 - AMS found to have large R MCA CVA taken to [facility] and admitted to neuro ICU following procedure, remains intubated. WBC 17, LLL opacities -- starting Unasyn . 6/5 - Increased somnolence, CT Head demonstrating 99mm midline shift and increased IC mass effect/edema, hypertonic 3% started. Tolerated short PSV/CPAP wean. . 6/6 -CT Head: decreased mass effect, right MCA infarct again seen.   Interim History / Subjective:  No acute events overnight.   Objective   Blood pressure (!) 159/113, pulse 71, temperature 98.3 F (36.8 C), temperature source Axillary, resp. rate 19, height 5\' 7"  (1.702 m), weight 89.6 kg, SpO2 99 %.     Vent Mode: PRVC FiO2 (%):  [30 %] 30 % Set Rate:  [18 bmp] 18 bmp Vt Set:  [540 mL] 540 mL PEEP:  [5 cmH20] 5 cmH20 Plateau Pressure:  [13 cmH20-16 cmH20] 13 cmH20   Intake/Output Summary (Last 24 hours) at 08/11/2020 0816 Last data filed at 08/11/2020 0630 Gross per 24 hour  Intake 2835.33 ml  Output 950 ml  Net 1885.33 ml   Filed Weights   08/07/20 2020 08/09/20 0400  Weight: 84.5 kg 89.6 kg   Physical Examination:  General:  Elderly appearing male  Neuro:  Alert to verbal. Follows commands in the lower extremities with 4/5 strength. RUE 4/5 and follows. No command following LUE.  HEENT:  /AT, No JVD noted, PERRL. ETT. NGT.  Cardiovascular:  RRR, no MRG Lungs:  Clear bilateral breath sounds Abdomen:  Soft, non-distended, non-tender.  Musculoskeletal:  No acute deformity Skin:  Intact, MMM   Imaging that I have personally reviewed: (right click and "Reselect all SmartList Selections" daily)   6/6 CTH - improved mass effect  Resolved Hospital Problem list     Assessment & Plan:   Acute right MCA CVA s/p thrombectomy with TICI 2c revascularization, mass effect, 7.44mm R-L shift Cytotoxic edema - Per Neuro/NIR - Goal SBP 120-140 - Hypertonic saline @ 50mL/hr - Trend Na - AC plan per Neuro  Acute respiratory failure requiring mechanical ventilation  In the setting of CVA and procedure. - Continue full vent support - Daily WUA/SBT - VAP bundle - Marginal candidate for extubation. Good lung mechanics. Cough rather weak, not lifting head on command. Will  discuss further.  - Continue to wean as able  Leukocytosis WBC 16, reactive vs. infectious vs. related to [place]. Has a LLL opacity, but PCT < 0.1 x 2 and afebrile, covered with Unasyn empirically. No DVT. - Unasyn off - Trend WBC, fever curve  Hx recent traumatic SDH  [place] 5[place] with +LOC. Admitted 5/23, taken for evac discharged to [facility] 5/27. Initial plan to resume warfarin 6/3 - SDH remains stable on  imaging - AC resumption per Neuro/NSGY  Hx Afib/Aflutter S/p AVR MVR (bioprosthetic) HTN Anticoagulation held following SDH, plan was restart warfarin 6/3 before requiring ICU admission. - AC plan per Neuro - Continue metoprolol (scheduled) - Goal SBP 120-140 - Continue Tikosyn  BPH - Continue Flomax, Proscar - Foley in place  Inadequate PO intake  - TF per RDN  Best practice (right click and "Reselect all SmartList Selections" daily)  Diet:  NPO Pain/Anxiety/Delirium protocol (if indicated): Yes (RASS goal 0) VAP protocol (if indicated): Yes DVT prophylaxis: SCD GI prophylaxis: PPI Glucose control:  SSI No Central venous access:  N/A Arterial line:  N/A Foley:  Yes, and it is still needed Mobility:  bed rest  PT consulted: N/A Last date of multidisciplinary goals of care discussion [per primary]  Code Status:  full code Disposition: ICU   Critical care time: 39 minutes     Georgann Housekeeper, AGACNP-BC [place] for personal pager PCCM on call pager [phone] until 7pm. Please call Elink 7p-7a. [phone]  08/11/2020 8:20 AM

## 2020-08-11 NOTE — Progress Notes (Signed)
SLP Cancellation Note  Patient Details Name: NICKI GRACY MRN: [id number] DOB: [birth year]   Cancelled treatment:       Reason Eval/Treat Not Completed: Patient not medically ready (remains on vent). Will f/u as able.    Osie Bond., M.A. [place] [facility] Pager [phone] Office [phone]  08/11/2020, 7:24 AM

## 2020-08-11 NOTE — Progress Notes (Signed)
INTERVAL HISTORY His wife is at the bedside. Pt opens his eyes and follows simple commands on the R side, with L sided neglect. He is getting hypertonic IVF for cytotoxic edema serum sodium today is 147 with hypertonic drip at 60 cc an hour.  Pt is still intubated. Will place cortrak for nutrition/meds.  Blood pressure adequately controlled.    Neurological exam is unchanged.  Vital signs are stable. Vitals:   08/11/20 1400 08/11/20 1459 08/11/20 1500 08/11/20 1600  BP: 132/71 132/71 137/75 (!) 154/77  Pulse: 69 73 71 84  Resp: 18 17 17  (!) 21  Temp:    98.2 F (36.8 C)  TempSrc:    Axillary  SpO2: 97% 100% 100% 100%  Weight:      Height:       CBC:  Recent Labs  Lab 08/08/20 0625 08/09/20 0451  WBC 17.5* 16.4*  NEUTROABS 13.5* 12.6*  HGB 12.3* 11.6*  HCT 36.7* 34.6*  MCV 94.1 93.5  PLT 254 841   Basic Metabolic Panel:  Recent Labs  Lab 08/09/20 0451 08/09/20 1039 08/09/20 1611 08/09/20 2158 08/10/20 0553 08/10/20 0905 08/11/20 1041 08/11/20 1621  NA 135   < > 140   < > 142   < > 150* 151*  K 3.7  --   --   --  3.9  --   --   --   CL 106  --   --   --  115*  --   --   --   CO2 24  --   --   --  23  --   --   --   GLUCOSE 144*  --   --   --  166*  --   --   --   BUN 20  --   --   --  21  --   --   --   CREATININE 0.81  --   --   --  0.69  --   --   --   CALCIUM 8.1*  --   --   --  8.0*  --   --   --   MG 2.1  --  2.0  --  2.2  --   --   --   PHOS 2.4*  --  2.7  --   --   --   --   --    < > = values in this interval not displayed.   Lipid Panel:  Recent Labs  Lab 08/08/20 0625  CHOL 119  TRIG 105  HDL 42  CHOLHDL 2.8  VLDL 21  LDLCALC 56   HgbA1c:  Recent Labs  Lab 08/08/20 0625  HGBA1C 6.2*   Urine Drug Screen: No results for input(s): LABOPIA, COCAINSCRNUR, LABBENZ, AMPHETMU, THCU, LABBARB in the last 168 hours.  Alcohol Level No results for input(s): ETH in the last 168 hours.  IMAGING past 24 hours No results found.  PHYSICAL  EXAM GENERAL: intubated elderly Caucasian male HEENT: Head is atraumatic, normocephalic. Eyes: PERRL.  LUNGS: deep breathing sounds CARDIOVASCULAR: tachycardic  ABDOMEN: Bowel sounds present.  Ext : normal pulse distally  NEUROLOGICAL EXAM MS intubated, opens his eyes to tactile/verbal stimuli, follows simple commands on the R side CN 2-12:  R gaze deviation, L facial asymmetry and weakness noted, no facial sensory deficits Motor: : L sided weakness noted. No Abnormal movements identified.  Sensory:  Unable to assess; seems intact. Toes: equivocal. No clonus Coordination: Unable to  assess Gait was not tested considering pt's condition   ASSESSMENT/PLAN 1. 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 S/P thrombectomy with TICI2C. Pt was deemed not a candidate for TPA being outside the TPA window. Vascular risk factors: HTN, DM, afib HLD   CT head  hypodensity right MCA vessel sign, aspects 5, R SDH unchanged, decreased interhemispheric [facility] SAH. Stable extraaxial hemorrhage over the L parietal lobe measuring 10 mm. 4 mm left midline shift  CTA head & neck RICA occlusion shortely beyond origin with R M2 occlusion. Moderate L vert stenosis. ascending thoracic aorta measures up to 4 cm in diameter, severe right PCA P3/P4 branch stenosis.   CT perfusion 45 mL core infarct within the right MCA vascular territory 162 mL region of hypoperfusion predominantly within the right MCA vascular territory.Reported mismatch volume: 117 mL.   Repeated CT head today showed Evolved relatively large right MCA infarcts since 08/07/2020 with increased intracranial mass effect and increased leftward midline shift now 9 mm.   2D Echo lvef 60-65% SEVERELY dilated LA  LDL 56  HgbA1c 6.2    Lovenox for VTE prophylaxis Diet Order            Diet NPO time specified  Diet effective now                 No antiplatelet prior to hospitalization; currently on ASA 325 mg per tube   Therapy recommendations:  Pending  Disposition:  Pending  Hypertension . Maintain SBP <160 for now.  . Restart Oral meds when pt is able to get. Pt is on Toprol 75 mg bid and losartan 25 mg po daily and lasix 20 mg po daily . Long-term BP goal <!40/90  Hyperlipidemia  Home meds:  Lipitor 80 mg qhs  LDL 56, goal < 70  Lipitor restarted per tube  Continue statin at discharge  Diabetes type II   HgbA1c 6.2, goal < 7.0  Maintain BG 80-180  CBGs Recent Labs    08/11/20 0723 08/11/20 1156 08/11/20 1557  GLUCAP 150* 132* 187*      SSI  Dysarthria, aphasia, dysphasia- d/t stroke Dysphagia following cerebral infarction  -NPO until cleared by speech once extubated -ST following -Advance diet as tolerated -May need PEG; will place Cortrak now for nutriton as currently OG is being used for meds only.  Hemiplegia and hemiparesis following cerebral infarction affecting left non-dominant side  -PT/OT -PM&R consult  RESP Acute Respiratory Failure  -Appreciate vent management per ICU -wean when able  Paroxysmal atrial fibrillation -Rate control, continue metoprolol and home dofetilide -Hold anticoagulation for now pending clinical course. On ASA   Thoracic aortic aneurysm -Known problem is being followed in [place], recent imaging in May -Follow-up outside records  HEME No active anemia -Monitor -transfuse for hgb < 7   GI/GU BPH -Gentle hydration -avoid nephrotoxic agents -Continue home medications  Fluid/Electrolyte Disorders -Monitor -Replete as needed  ID Possible PNA, WBC 17.5 >16.4, CXR with L basilar consolidation  -On Unasyn  IV -NPO -Monitor  OTHER ISSUES:  Cerebral edema:  -CT head 6/5 (post stroke day 3, which will be near  peak) showed increased cerebral edema with 9 mm mildline shift. Hypertonic saline started for 2 days and if stable can wean off. Baseline  Na 135 -CTH 6/6-stable, decreased mass effect. [id number]  -Increase Hypertonic saline 3% NS to 60 cc/h, NA goal 150-155  - Check Na q6h    .  Patient continues to require ventilatory support for respiratory failure due to cytotoxic edema from his large stroke.  Continue hypertonic saline drip at 60 cc an hour for serum sodium goal between 150-155.  Plan check CT scan of the head tomorrow morning and if midline shift is reduced may consider tapering hypertonic saline drip and extubation if tolerated.  Long discussion with his wife at the bedside and answered questions.  Discussed with Dr. Tacy Learn critical care medicine. This patient is critically ill and at significant risk of neurological worsening, death and care requires constant monitoring of vital signs, hemodynamics,respiratory and cardiac monitoring, extensive review of multiple databases, frequent neurological assessment, discussion with family, other specialists and medical decision making of high complexity.I have made any additions or clarifications directly to the above note.This critical care time does not reflect procedure time, or teaching time or supervisory time of PA/NP/Med Resident etc but could involve care discussion time.  I spent 30 minutes of neurocritical care time  in the care of  this patient.     Antony Contras, MD Medical Director [place] Pager: [phone] 08/11/2020 5:44 PM  To contact Stroke Continuity provider, please refer to [email or web address] After hours, contact General Neurology

## 2020-08-11 NOTE — Progress Notes (Signed)
Referring Physician(s): Dr. Lyn Records  Supervising Physician: Luanne Bras  Patient Status:  [facility] - In-pt  Chief Complaint:  Code Stroke. CVA s/p cerebral arteriogram with emergent mechanical thrombectomy of right ICA terminus occlusion achieving a TICI 2c revascularization via right femoral approach 08/07/2020 by Dr. Estanislado Pandy.  Subjective:  Patient continues to be on the intubated. No longer on propofol.Wife is at bedside. Follows commands. Shakes head when asked if he has a headache.   Allergies: Naproxen and No healthtouch food allergies  Medications: Prior to Admission medications   Medication Sig Start Date End Date Taking? Authorizing Provider  atorvastatin (LIPITOR) 80 MG tablet TAKE 1 TABLET (80 MG TOTAL) BY MOUTH EVERY EVENING. Patient taking differently: Take 80 mg by mouth daily. 04/07/20  Yes Larey Dresser, MD  clobetasol cream (TEMOVATE) 2.99 % Apply 1 application topically daily as needed (for skin irritation).  05/20/14  Yes [provider]  Coenzyme Q10 (CO Q-10) 300 MG CAPS Take 300 mg by mouth daily.   Yes [provider]  Cyanocobalamin (VITAMIN B-12) 5000 MCG SUBL Place 5,000 mcg under the tongue daily at 2 PM.    Yes [provider]  docusate sodium (COLACE) 100 MG capsule Take 100 mg by mouth daily.   Yes [provider]  dofetilide (TIKOSYN) 500 MCG capsule Take 1 capsule (500 mcg total) by mouth 2 (two) times daily. 03/26/20  Yes Larey Dresser, MD  finasteride (PROSCAR) 5 MG tablet Take 5 mg by mouth daily.   Yes [provider]  furosemide (LASIX) 20 MG tablet Take 1 tablet (20 mg total) by mouth daily. 03/26/20  Yes Larey Dresser, MD  Glucosamine-Chondroitin (COSAMIN DS PO) Take 1 tablet by mouth 2 (two) times daily.   Yes [provider]  loratadine (CLARITIN) 10 MG tablet Take 10 mg by mouth daily as needed for allergies.    Yes [provider]  losartan (COZAAR) 25 MG tablet Take 1  tablet (25 mg total) by mouth daily. 03/26/20  Yes Larey Dresser, MD  metoprolol succinate (TOPROL-XL) 50 MG 24 hr tablet Take 75 mg (1.5 tabs) by mouth twice daily. Take with or immediately following a meal. Patient taking differently: Take 75 mg by mouth in the morning and at bedtime. 05/22/20  Yes Larey Dresser, MD  Multiple Vitamins-Minerals (MULTIVITAL-M) TABS Take 1 tablet by mouth daily.    Yes [provider]  potassium chloride SA (KLOR-CON) 20 MEQ tablet Take 1 tablet (20 mEq total) by mouth daily. 03/26/20  Yes Larey Dresser, MD  Probiotic Product (PROBIOTIC FORMULA PO) Take 1 capsule by mouth daily.   Yes [provider]  tamsulosin (FLOMAX) 0.4 MG CAPS capsule Take 0.4 mg by mouth 2 (two) times daily. 09/14/18  Yes [provider]  warfarin (COUMADIN) 5 MG tablet As directed Patient taking differently: Take 5 mg by mouth See admin instructions. 1 tablet everyday with an addition of 1 1/2 tablet on MWF and Saturday. 03/26/20  Yes Larey Dresser, MD  COVID-19 mRNA vaccine, Moderna, 100 MCG/0.5ML injection Inject into the muscle. 06/26/20   Carlyle Basques, MD     Vital Signs: BP 140/77   Pulse 79   Temp 98.3 F (36.8 C) (Axillary)   Resp 16   Ht 5\' 7"  (1.702 m)   Wt 197 lb 8.5 oz (89.6 kg)   SpO2 99%   BMI 30.94 kg/m   Physical Exam Vitals and nursing note reviewed.  Constitutional:  Appearance: He is well-developed.  Pulmonary:     Comments: vented Musculoskeletal:        General: Normal range of motion.  Skin:    General: Skin is dry.  Neurological:     Mental Status: He is alert.     Comments: Patient alert. Remains intubated no propofol running. [name] denies a headache. Can spontaneously move all 4 extremities. Left leg 3/5  Persistent left sided weakness. Left sided facial droop. Follows simple commands     Imaging: CT HEAD WO CONTRAST  Result Date: 08/10/2020 CLINICAL DATA:  Right MCA territory infarction. Bilateral  subdural hematomas. EXAM: CT HEAD WITHOUT CONTRAST TECHNIQUE: Contiguous axial images were obtained from the base of the skull through the vertex without intravenous contrast. COMPARISON:  CT head without contrast 08/09/2020. MR head without contrast 08/07/2020 FINDINGS: Brain: Mass effect associated with the right MCA territory infarct is decreasing. Midline shift still measures 8 mm. Bilateral extra-axial collections remain. Left parietal infarct and extra-axial hemorrhage is stable. Left scalp contusion stable. No new infarct is present. Involvement of the right basal ganglia is stable. No new hemorrhage is present. Vascular: Atherosclerotic calcifications are present within the cavernous internal carotid arteries bilaterally. Right MCA branch hyperdensities again noted. Skull: Right frontal craniotomy.  Calvarium otherwise intact. 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 Fluid is present in the nasopharynx. Bilateral lens replacements are noted. Globes and orbits are otherwise unremarkable. IMPRESSION: 1. Decreased mass effect associated with the right MCA territory infarct. 2. Stable bilateral extra-axial collections. 3. Stable left parietal infarct and extra-axial hemorrhage. 4. No new infarct. Electronically Signed   By: San Morelle M.D.   On: 08/10/2020 10:53   CT HEAD WO CONTRAST  Result Date: 08/09/2020 CLINICAL DATA:  80 year old male with altered mental status. Right MCA infarct with right ICA occlusion in the neck on CTA, occluded right MCA M2 branch. Status post endovascular reperfusion on 08/07/2020. EXAM: CT HEAD WITHOUT CONTRAST TECHNIQUE: Contiguous axial images were obtained from the base of the skull through the vertex without intravenous contrast. COMPARISON:  Posttreatment brain MRI 08/07/2020. Pretreatment head CT 08/07/2020. FINDINGS: Brain: Pre-existing right greater than left subdural hematomas, with lobulated blood along the  left lateral parieto-occipital junction. Trace IVH was also present prior to treatment. Relatively large right MCA territory infarct with progressed cytotoxic edema and parenchymal heterogeneity from the prior CT. No malignant hemorrhagic transformation identified, but increasing intracranial mass effect now with leftward midline shift up to 9 mm (previously 4 mm series 3, image 18 today). Effaced right lateral ventricle. Stable left lateral ventricle size. Basilar cisterns remain patent. Stable gray-white matter differentiation elsewhere, including periventricular hypodensity and focal encephalomalacia underlying the left posterior convexity extra-axial blood. Vascular: Calcified atherosclerosis at the skull base. Skull: Recent right side craniotomy. No acute osseous abnormality identified. Sinuses/Orbits: Increased paranasal sinus fluid levels and opacification, intubated on the scout view. Tympanic cavities and mastoids remain clear. Other: Sequelae of recent craniotomy, skin staples in place. Orbits soft tissues appear stable and negative. IMPRESSION: 1. Evolved relatively large right MCA infarcts since 08/07/2020 with increased intracranial mass effect and increased leftward midline shift now 9 mm. 2. No malignant hemorrhagic transformation. Pre-existing right greater than left subdural hematomas and trace IVH. Electronically Signed   By: Genevie Ann M.D.   On: 08/09/2020 09:32   MR ANGIO HEAD WO CONTRAST  Result Date: 08/07/2020 CLINICAL DATA:  Follow-up stroke.  Right MCA territory infarction EXAM: MRI HEAD WITHOUT  CONTRAST MRA HEAD WITHOUT CONTRAST TECHNIQUE: Multiplanar, multi-echo pulse sequences of the brain and surrounding structures were acquired without intravenous contrast. Angiographic images of the Circle of Willis were acquired using MRA technique without intravenous contrast. COMPARISON: No pertinent prior exam. COMPARISON:  CT studies earlier today. FINDINGS: MRI HEAD FINDINGS Brain: Diffusion  imaging shows confluent acute infarction within the right middle cerebral artery territory with some sparing of the anterior insula and frontal operculum and only spotty involvement the caudate and putamen. Mild swelling of the infarction with minimal petechial blood products but no frank hematoma. No focal abnormality affects the brainstem or cerebellum. There has been right parietal craniotomy for evacuation of a large right subdural hematoma. Small amount subdural blood does persist, maximal thickness in the frontal region 8 mm. Small amount of subdural blood also in the left parietal and occipital region, maximal thickness 1 cm without significant mass effect. Right-to-left midline shift presently of 7.5 mm. Mild chronic small-vessel ischemic changes of the white matter. Old infarction at the left parietooccipital junction with chronic volume loss. Small amount of hemorrhagic contusion in this region. Small amount of blood dependent within the occipital horns of the lateral ventricles, without evidence of obstructive hydrocephalus. Vascular: Major vessels at the base of the brain show flow. Skull and upper cervical spine: No primary calvarial finding. Sinuses/Orbits: Rhinosinusitis of the right maxillary sinus. Orbits negative. Other: Left parietal scalp bruising. MRA HEAD FINDINGS Anterior circulation: Both internal carotid arteries widely patent through the skull base and siphon regions. The anterior and middle cerebral vessels are patent. No evidence of large or medium vessel occlusion in the right MCA territory. Probable luxury perfusion throughout the right MCA territory. Azygos anterior cerebral artery incidentally noted. Posterior circulation: Both vertebral arteries widely patent to the basilar. No basilar stenosis. Posterior circulation branch vessels are patent. Anatomic variants: None other significant. IMPRESSION: Confluent acute infarction throughout the majority of the right middle cerebral artery  territory, with some sparing of the anterior insula and frontal operculum. Spotty involvement of the right caudate and putamen. Swelling and petechial blood products but no frank hematoma. Mass effect with right-to-left shift of 7.5 mm. Recent right parietal craniectomy for evacuation of subdural hematoma. Residual subdural blood and fluid on the right, maximal thickness 8 mm. Residual subdural blood in the left parieto-occipital region, maximal thickness 1 cm. Small amount of blood dependent in the occipital horns of the lateral ventricles without evidence of change in ventricular size. Small hemorrhagic contusion in the region of subacute infarction in the right parietooccipital junction. MR angiography negative for large or medium vessel occlusion presently. Luxury perfusion in the MCA branches. Electronically Signed   By: Nelson Chimes M.D.   On: 08/07/2020 16:40   MR BRAIN WO CONTRAST  Result Date: 08/07/2020 CLINICAL DATA:  Follow-up stroke.  Right MCA territory infarction EXAM: MRI HEAD WITHOUT CONTRAST MRA HEAD WITHOUT CONTRAST TECHNIQUE: Multiplanar, multi-echo pulse sequences of the brain and surrounding structures were acquired without intravenous contrast. Angiographic images of the Circle of Willis were acquired using MRA technique without intravenous contrast. COMPARISON: No pertinent prior exam. COMPARISON:  CT studies earlier today. FINDINGS: MRI HEAD FINDINGS Brain: Diffusion imaging shows confluent acute infarction within the right middle cerebral artery territory with some sparing of the anterior insula and frontal operculum and only spotty involvement the caudate and putamen. Mild swelling of the infarction with minimal petechial blood products but no frank hematoma. No focal abnormality affects the brainstem or cerebellum. There has been right parietal  craniotomy for evacuation of a large right subdural hematoma. Small amount subdural blood does persist, maximal thickness in the frontal region  8 mm. Small amount of subdural blood also in the left parietal and occipital region, maximal thickness 1 cm without significant mass effect. Right-to-left midline shift presently of 7.5 mm. Mild chronic small-vessel ischemic changes of the white matter. Old infarction at the left parietooccipital junction with chronic volume loss. Small amount of hemorrhagic contusion in this region. Small amount of blood dependent within the occipital horns of the lateral ventricles, without evidence of obstructive hydrocephalus. Vascular: Major vessels at the base of the brain show flow. Skull and upper cervical spine: No primary calvarial finding. Sinuses/Orbits: Rhinosinusitis of the right maxillary sinus. Orbits negative. Other: Left parietal scalp bruising. MRA HEAD FINDINGS Anterior circulation: Both internal carotid arteries widely patent through the skull base and siphon regions. The anterior and middle cerebral vessels are patent. No evidence of large or medium vessel occlusion in the right MCA territory. Probable luxury perfusion throughout the right MCA territory. Azygos anterior cerebral artery incidentally noted. Posterior circulation: Both vertebral arteries widely patent to the basilar. No basilar stenosis. Posterior circulation branch vessels are patent. Anatomic variants: None other significant. IMPRESSION: Confluent acute infarction throughout the majority of the right middle cerebral artery territory, with some sparing of the anterior insula and frontal operculum. Spotty involvement of the right caudate and putamen. Swelling and petechial blood products but no frank hematoma. Mass effect with right-to-left shift of 7.5 mm. Recent right parietal craniectomy for evacuation of subdural hematoma. Residual subdural blood and fluid on the right, maximal thickness 8 mm. Residual subdural blood in the left parieto-occipital region, maximal thickness 1 cm. Small amount of blood dependent in the occipital horns of the  lateral ventricles without evidence of change in ventricular size. Small hemorrhagic contusion in the region of subacute infarction in the right parietooccipital junction. MR angiography negative for large or medium vessel occlusion presently. Luxury perfusion in the MCA branches. Electronically Signed   By: Nelson Chimes M.D.   On: 08/07/2020 16:40   DG Abd Portable 1V  Result Date: 08/10/2020 CLINICAL DATA:  Feeding tube placement EXAM: PORTABLE ABDOMEN - 1 VIEW COMPARISON:  08/07/2020. FINDINGS: The weighted feeding tube tip is well below the level of the GE junction in the projection of the central abdomen. This is likely within the body of stomach. Bowel gas pattern appears normal. IMPRESSION: Feeding tube is in the stomach. Electronically Signed   By: Kerby Moors M.D.   On: 08/10/2020 16:26   ECHOCARDIOGRAM COMPLETE  Result Date: 08/07/2020    ECHOCARDIOGRAM REPORT   Patient Name:   [name] [name] Date of Exam: 08/07/2020 Medical Rec #:  [id number]     Height:       68.0 in Accession #:    [id number]    Weight:       204.4 lb Date of Birth:  May 10, 1940     BSA:          2.063 m Patient Age:    47 years      BP:           116/80 mmHg Patient Gender: M             HR:           70 bpm. Exam Location:  Inpatient Procedure: 2D Echo, Cardiac Doppler, Color Doppler and Intracardiac            Opacification Agent Indications:  Stroke  History:        Patient has prior history of Echocardiogram examinations, most                 recent 02/22/2018. 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 Hx rheumatic heart                 disease. S/p AVR. S/p MVR.  Sonographer:    Clayton Lefort RDCS (AE) Referring Phys: [id number] Lorenza Chick  Sonographer Comments: Suboptimal apical window. IMPRESSIONS  1. Left ventricular ejection fraction, by estimation, is 60 to 65%. The left ventricle has normal function. The left ventricle has no regional wall motion abnormalities. There  is severe concentric left ventricular hypertrophy. Diastolic function indeterminant due to mitral valve repair.  2. Right ventricular systolic function is normal. The right ventricular size is normal.  3. Left atrial size was severely dilated.  4. Right atrial size was mildly dilated.  5. The mitral valve has been repaired/replaced. A bioprosthetic mitral valve is present. The valve appears well seated with normal function by doppler interrogation. Mean gradient 60mmHg at HR 67bpm. There is no mitral valve regurgitation.  6. The aortic valve has been repaired/replaced. A bioprosthetic aortic valve is present and appears well seated with normal function by doppler interrogation. Mean gradient 27mmHg, peak gradient 48mmHg, Vmax 2.37, DI 0.4. Trace aortic regurgitation visualized.  7. Aortic dilatation noted. 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 Comparison(s): Compared to prior TTE in our system (2019), the aortic root now measures 49mm (previously 59mm) and the ascending aorta now measures 78mm (previously 29mm). Notably, the patient has had subsequent TTEs after 2019 at [facility] clinic. FINDINGS  Left Ventricle: Left ventricular ejection fraction, by estimation, is 60 to 65%. The left ventricle has normal function. The left ventricle has no regional wall motion abnormalities. Definity contrast agent was given IV to delineate the left ventricular  endocardial borders. The left ventricular internal cavity size was normal in size. There is severe concentric left ventricular hypertrophy. Diastolic function indeterminant due to mitral valve repair. Right Ventricle: The right ventricular size is normal. No increase in right ventricular wall thickness. Right ventricular systolic function is normal. Left Atrium: Left atrial size was severely dilated. Right Atrium: Right atrial size was mildly dilated.  Pericardium: There is no evidence of pericardial effusion. Mitral Valve: The mitral valve has been repaired/replaced. A bioprosthetic mitral valve is present. The valve appears well seated with normal function by doppler interrogation. Mean gradient 28mmHg at HR 67bpm. There is no mitral valve regurgitation. Mean  gradient 86mmHg at HR 67bpm. No mitral regurgitation mitral valve regurgitation. MV peak gradient, 12.0 mmHg. The mean mitral valve gradient is 4.5 mmHg. Tricuspid Valve: The tricuspid valve is normal in structure. Tricuspid valve regurgitation is trivial. Aortic Valve: The aortic valve has been repaired/replaced. Aortic valve regurgitation is trivial. A bioprosthetic aortic valve is present and appears well seated with normal function by doppler interrogation.Aortic valve mean gradient measures 12.0 mmHg.  Aortic valve peak gradient measures 21.0 mmHg. Aortic valve area, by VTI measures 1.67 cm. Pulmonic Valve: The pulmonic valve was normal in structure. Pulmonic valve regurgitation is trivial. Aorta: Aortic dilatation noted. 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 There is moderate-to-severe dilatation of the ascending aorta, measuring  49 mm. Venous: IVC assessment for right atrial pressure unable to be performed due to mechanical ventilation. IAS/Shunts: No atrial level shunt detected by color flow Doppler.  LEFT VENTRICLE PLAX 2D LVIDd:         3.90 cm LVIDs:         2.70 cm LV PW:         1.80 cm LV IVS:        2.00 cm LVOT diam:     2.40 cm LV SV:         76 LV SV Index:   37 LVOT Area:     4.52 cm  RIGHT VENTRICLE             IVC RV Basal diam:  3.40 cm     IVC diam: 2.00 cm RV S prime:     12.50 cm/s TAPSE (M-mode): 1.5 cm LEFT ATRIUM            Index       RIGHT ATRIUM           Index LA diam:      5.00 cm  2.42 cm/m  RA Area:     22.30 cm LA Vol (A2C): 88.8 ml  43.05 ml/m RA Volume:   68.00 ml  32.97 ml/m LA Vol (A4C): 126.0 ml 61.09 ml/m  AORTIC VALVE AV  Area (Vmax):    1.83 cm AV Area (Vmean):   1.80 cm AV Area (VTI):     1.67 cm AV Vmax:           229.25 cm/s AV Vmean:          162.250 cm/s AV VTI:            0.453 m AV Peak Grad:      21.0 mmHg AV Mean Grad:      12.0 mmHg LVOT Vmax:         92.77 cm/s LVOT Vmean:        64.667 cm/s LVOT VTI:          0.168 m LVOT/AV VTI ratio: 0.37  AORTA Ao Root diam: 4.90 cm Ao Asc diam:  4.90 cm MITRAL VALVE            TRICUSPID VALVE MV Area VTI:  1.64 cm  TR Peak grad:   15.2 mmHg MV Peak grad: 12.0 mmHg TR Vmax:        195.00 cm/s MV Mean grad: 4.5 mmHg MV Vmax:      1.74 m/s  SHUNTS MV Vmean:     97.3 cm/s Systemic VTI:  0.17 m                         Systemic Diam: 2.40 cm Gwyndolyn Kaufman MD Electronically signed by Gwyndolyn Kaufman MD Signature Date/Time: 08/07/2020/6:59:58 PM    Final    VAS [place] LOWER EXTREMITY VENOUS (DVT)  Result Date: 08/09/2020  Lower Venous DVT Study Patient Name:  [name] [name]  Date of Exam:   08/08/2020 Medical Rec #: [id number]      Accession #:    [id number] Date of Birth: April 28, 1940      Patient Gender: M Patient Age:   79Y Exam Location:  [facility] Procedure:      VAS [place] LOWER EXTREMITY VENOUS (DVT) Referring Phys: [id number] GRACE E BOWSER --------------------------------------------------------------------------------  Indications: Stroke.  Comparison Study: No prior Performing Technologist: Oda Cogan RDMS, RVT  Examination Guidelines: A complete  evaluation includes B-mode imaging, spectral Doppler, color Doppler, and power Doppler as needed of all accessible portions of each vessel. Bilateral testing is considered an integral part of a complete examination. Limited examinations for reoccurring indications may be performed as noted. The reflux portion of the exam is performed with the patient in reverse Trendelenburg.  +---------+---------------+---------+-----------+----------+--------------+ RIGHT    CompressibilityPhasicitySpontaneityPropertiesThrombus Aging  +---------+---------------+---------+-----------+----------+--------------+ CFV      Full           Yes      Yes                                 +---------+---------------+---------+-----------+----------+--------------+ SFJ      Full                                                        +---------+---------------+---------+-----------+----------+--------------+ FV Prox  Full                                                        +---------+---------------+---------+-----------+----------+--------------+ FV Mid   Full                                                        +---------+---------------+---------+-----------+----------+--------------+ FV DistalFull                                                        +---------+---------------+---------+-----------+----------+--------------+ PFV      Full                                                        +---------+---------------+---------+-----------+----------+--------------+ POP      Full           Yes      Yes                                 +---------+---------------+---------+-----------+----------+--------------+ PTV      Full                                                        +---------+---------------+---------+-----------+----------+--------------+ PERO     Full                                                        +---------+---------------+---------+-----------+----------+--------------+   +---------+---------------+---------+-----------+----------+--------------+  LEFT     CompressibilityPhasicitySpontaneityPropertiesThrombus Aging +---------+---------------+---------+-----------+----------+--------------+ CFV      Full           Yes      Yes                                 +---------+---------------+---------+-----------+----------+--------------+ SFJ      Full                                                         +---------+---------------+---------+-----------+----------+--------------+ FV Prox  Full                                                        +---------+---------------+---------+-----------+----------+--------------+ FV Mid   Full                                                        +---------+---------------+---------+-----------+----------+--------------+ FV DistalFull                                                        +---------+---------------+---------+-----------+----------+--------------+ PFV      Full                                                        +---------+---------------+---------+-----------+----------+--------------+ POP      Full           Yes      Yes                                 +---------+---------------+---------+-----------+----------+--------------+ PTV      Full                                                        +---------+---------------+---------+-----------+----------+--------------+ PERO     Full                                                        +---------+---------------+---------+-----------+----------+--------------+     Summary: BILATERAL: - No evidence of deep vein thrombosis seen in the lower extremities, bilaterally. -No evidence of popliteal cyst, bilaterally.   *See table(s) above for measurements and observations. Electronically signed by Ruta Hinds MD on 08/09/2020 at 5:11:11 PM.  Final    [place] EKG SITE RITE  Result Date: 08/09/2020 If Site Rite image not attached, placement could not be confirmed due to current cardiac rhythm.   Labs:  CBC: Recent Labs    08/01/20 0610 08/07/20 0854 08/07/20 1337 08/08/20 0625 08/09/20 0451  WBC 13.4* 17.7*  --  17.5* 16.4*  HGB 10.4* 13.1 12.2* 12.3* 11.6*  HCT 31.1* 38.3* 36.0* 36.7* 34.6*  PLT 214 311  --  254 229    COAGS: Recent Labs    07/26/20 1227 07/27/20 1833 07/28/20 0141 07/29/20 0157  INR 3.4* 3.4* 1.4* 1.1  APTT  --  45*   --   --     BMP: Recent Labs    08/07/20 0854 08/07/20 1337 08/08/20 0625 08/09/20 0451 08/09/20 1039 08/10/20 0553 08/10/20 0905 08/10/20 1559 08/10/20 2117 08/11/20 0225 08/11/20 1041  NA 134* 136 134* 135   < > 142   < > 144 145 147* 150*  K 4.1 4.0 3.8 3.7  --  3.9  --   --   --   --   --   CL 104  --  102 106  --  115*  --   --   --   --   --   CO2 24  --  24 24  --  23  --   --   --   --   --   GLUCOSE 116*  --  114* 144*  --  166*  --   --   --   --   --   BUN 29*  --  25* 20  --  21  --   --   --   --   --   CALCIUM 8.8*  --  8.1* 8.1*  --  8.0*  --   --   --   --   --   CREATININE 0.92  --  0.93 0.81  --  0.69  --   --   --   --   --   GFRNONAA >60  --  >60 >60  --  >60  --   --   --   --   --    < > = values in this interval not displayed.    LIVER FUNCTION TESTS: Recent Labs    07/27/20 1833 08/01/20 0610  BILITOT 0.9 1.1  AST 34 39  ALT 33 52*  ALKPHOS 65 46  PROT 7.0 5.9*  ALBUMIN 3.8 2.8*    Assessment and Plan:  80 y.o. male inpatient. History of HTN, CHF AAA, a fib/flutter ( on coumadin). Admitted on 5.23.22 with difficulty speaking and right sided weakness after suffering a fall the day prior hitting the back of his head with LOC.  Found to have a leftward midline shift s.p craniotomy on 5.24.22. Found to have a decrease in LOC and left sided movement on 6.3.22 found to have a large endoluminal thrombus stroke. CVA s/p cerebral arteriogram with emergent mechanical thrombectomy of right ICA terminus occlusion achieving a TICI 2c revascularization via right femoral approach 08/07/2020 by Dr. Estanislado Pandy. Patient seen with Dr. Dr. Luanne Bras. Wife at Bedside. Patient clinically improving. Patient alert. Remains intubated no propofol running. Mr. [place] denies a headache. Can spontaneously move all 4 extremities. Left leg 3/5  Persistent left sided weakness. Left sided facial droop. Follows simple commands. No additional imaging since procedure.    IR  will continue to follow along - plans per  neurology   Electronically Signed: Jacqualine Mau, NP 08/11/2020, 2:06 PM   I spent a total of 15 Minutes at the patient's bedside AND on the patient's hospital floor or unit, greater than 50% of which was counseling/coordinating care for CVA s/p revascularization.

## 2020-08-11 NOTE — Progress Notes (Signed)
Pt placed on full ventilator support for night time rest.  Will continue with vent wean in the am following patients wake up assessment.

## 2020-08-11 NOTE — Progress Notes (Signed)
PT Cancellation Note  Patient Details Name: [name] [name] MRN: [id number] DOB: 17-Aug-1940   Cancelled Treatment:    Reason Eval/Treat Not Completed: Patient not medically ready (weaning pending extubation).  Wyona Almas, PT, DPT Acute Rehabilitation Services Pager [phone] Office [phone]    Deno Etienne 08/11/2020, 1:16 PM

## 2020-08-11 NOTE — Progress Notes (Signed)
OT Cancellation Note  Patient Details Name: [name] [name] MRN: [id number] DOB: 06-Jun-1940   Cancelled Treatment:    Reason Eval/Treat Not Completed: Patient not medically ready (weaning pending extubation-OT following for the appropriate time to evaluate)  Billey Chang, OTR/L  Acute Rehabilitation Services Pager: [phone] Office: [phone] .  08/11/2020, 9:38 AM

## 2020-08-12 ENCOUNTER — Inpatient Hospital Stay (HOSPITAL_COMMUNITY): Payer: Medicare Other

## 2020-08-12 ENCOUNTER — Other Ambulatory Visit (HOSPITAL_COMMUNITY): Payer: Self-pay | Admitting: Cardiology

## 2020-08-12 LAB — BASIC METABOLIC PANEL
Anion gap: 4 — ABNORMAL LOW (ref 5–15)
BUN: 33 mg/dL — ABNORMAL HIGH (ref 8–23)
CO2: 26 mmol/L (ref 22–32)
Calcium: 7.8 mg/dL — ABNORMAL LOW (ref 8.9–10.3)
Chloride: 122 mmol/L — ABNORMAL HIGH (ref 98–111)
Creatinine, Ser: 0.73 mg/dL (ref 0.61–1.24)
GFR, Estimated: >60 mL/min (ref >60)
Glucose, Bld: 174 mg/dL — ABNORMAL HIGH (ref 70–99)
Potassium: 3.3 mmol/L — ABNORMAL LOW (ref 3.5–5.1)
Sodium: 152 mmol/L — ABNORMAL HIGH (ref 135–145)

## 2020-08-12 LAB — GLUCOSE, CAPILLARY
Glucose-Capillary: 196 mg/dL — ABNORMAL HIGH (ref 70–99)
Glucose-Capillary: 177 mg/dL — ABNORMAL HIGH (ref 70–99)
Glucose-Capillary: 141 mg/dL — ABNORMAL HIGH (ref 70–99)
Glucose-Capillary: 150 mg/dL — ABNORMAL HIGH (ref 70–99)
Glucose-Capillary: 169 mg/dL — ABNORMAL HIGH (ref 70–99)

## 2020-08-12 LAB — SODIUM
Sodium: 153 mmol/L — ABNORMAL HIGH (ref 135–145)
Sodium: 164 mmol/L (ref 135–145)
Sodium: 156 mmol/L — ABNORMAL HIGH (ref 135–145)

## 2020-08-12 LAB — MAGNESIUM: Magnesium: 2.3 mg/dL (ref 1.7–2.4)

## 2020-08-12 LAB — PHOSPHORUS: Phosphorus: 1.9 mg/dL — ABNORMAL LOW (ref 2.5–4.6)

## 2020-08-12 MED ORDER — BISACODYL 10 MG RE SUPP
10.0000 mg | Freq: Once | RECTAL | Status: AC
  Administered 2020-08-12: 10 mg via RECTAL
  Filled 2020-08-12: qty 1

## 2020-08-12 MED ORDER — HYDRALAZINE HCL 20 MG/ML IJ SOLN: 20.0000 mg | Freq: Four times a day (QID) | INTRAMUSCULAR | Status: DC | PRN

## 2020-08-12 MED ORDER — ORAL CARE MOUTH RINSE
15.0000 mL | Freq: Two times a day (BID) | OROMUCOSAL | Status: DC
  Administered 2020-08-12 – 2020-08-15 (×6): 15 mL via OROMUCOSAL

## 2020-08-12 MED ORDER — LABETALOL HCL 5 MG/ML IV SOLN: 20.0000 mg | INTRAVENOUS | Status: DC | PRN

## 2020-08-12 MED ORDER — POTASSIUM CHLORIDE 10 MEQ/50ML IV SOLN
10.0000 meq | INTRAVENOUS | Status: AC
  Administered 2020-08-12 – 2020-08-13 (×4): 10 meq via INTRAVENOUS
  Filled 2020-08-12: qty 50

## 2020-08-12 MED ORDER — LOSARTAN POTASSIUM 25 MG PO TABS
25.0000 mg | ORAL_TABLET | Freq: Every day | ORAL | Status: DC
  Administered 2020-08-12 – 2020-08-15 (×4): 25 mg
  Filled 2020-08-12 (×4): qty 1

## 2020-08-12 MED ORDER — POTASSIUM CHLORIDE 20 MEQ PO PACK
20.0000 meq | PACK | ORAL | Status: AC
  Administered 2020-08-12 – 2020-08-13 (×2): 20 meq
  Filled 2020-08-12 (×2): qty 1

## 2020-08-12 MED ORDER — CHLORHEXIDINE GLUCONATE 0.12 % MT SOLN
15.0000 mL | Freq: Two times a day (BID) | OROMUCOSAL | Status: DC
  Administered 2020-08-14 (×3): 15 mL via OROMUCOSAL
  Filled 2020-08-12 (×5): qty 15

## 2020-08-12 NOTE — Progress Notes (Addendum)
INTERVAL HISTORY Wife is at bedside, all questions answered regarding possible extubation today. Patients physical exam is stable; opens eyes and follows simple commands on the right side. Remains intubated, hopefully will extubate later today given he is stable on the vent.  Blood pressure adequately controlled.  White count is coming down to 16.4 today.  He remains on hypertonic saline at 60 cc an hour and serum sodium is optimal at 153.  Repeat CT scan of the head shows evolving large right MCA infarct with similar cytotoxic edema and stable 8 mm right left shift. Vitals:   08/12/20 1216 08/12/20 1300 08/12/20 1400 08/12/20 1403  BP: (!) 147/66 (!) 147/73 (!) 163/84 131/85  Pulse: 70 64 67 69  Resp: 18 17 (!) 21 (!) 22  Temp:      TempSrc:      SpO2: 100% 100% 100% 100%  Weight:      Height:       CBC:  Recent Labs  Lab 08/08/20 0625 08/09/20 0451  WBC 17.5* 16.4*  NEUTROABS 13.5* 12.6*  HGB 12.3* 11.6*  HCT 36.7* 34.6*  MCV 94.1 93.5  PLT 254 937   Basic Metabolic Panel:  Recent Labs  Lab 08/09/20 0451 08/09/20 1039 08/09/20 1611 08/09/20 2158 08/10/20 0553 08/10/20 0905 08/12/20 0359 08/12/20 0930  NA 135   < > 140   < > 142   < > 153* 164*  K 3.7  --   --   --  3.9  --   --   --   CL 106  --   --   --  115*  --   --   --   CO2 24  --   --   --  23  --   --   --   GLUCOSE 144*  --   --   --  166*  --   --   --   BUN 20  --   --   --  21  --   --   --   CREATININE 0.81  --   --   --  0.69  --   --   --   CALCIUM 8.1*  --   --   --  8.0*  --   --   --   MG 2.1  --  2.0  --  2.2  --   --   --   PHOS 2.4*  --  2.7  --   --   --   --   --    < > = values in this interval not displayed.   Lipid Panel:  Recent Labs  Lab 08/08/20 0625  CHOL 119  TRIG 105  HDL 42  CHOLHDL 2.8  VLDL 21  LDLCALC 56   HgbA1c:  Recent Labs  Lab 08/08/20 0625  HGBA1C 6.2*   Urine Drug Screen: No results for input(s): LABOPIA, COCAINSCRNUR, LABBENZ, AMPHETMU, THCU, LABBARB in  the last 168 hours.  Alcohol Level No results for input(s): ETH in the last 168 hours.  IMAGING past 24 hours CT Head Wo Contrast  Result Date: 08/12/2020 CLINICAL DATA:  Stroke, follow-up EXAM: CT HEAD WITHOUT CONTRAST TECHNIQUE: Contiguous axial images were obtained from the base of the skull through the vertex without intravenous contrast. COMPARISON:  08/10/2020 FINDINGS: Brain: Evolving large right MCA territory infarction again identified. Areas of relative hyperdensity probably represent petechial hemorrhage. Remains partial effacement of the right lateral ventricle with stable 8 mm  leftward midline shift. Residual trace layering hemorrhage within the left occipital horn. Stable mixed density extra-axial hemorrhage along the posterior left cerebral convexity with adjacent chronic infarct. Thin residual mixed density subdural hematoma with a few small foci of air underlying the right craniotomy. No new loss of gray-white differentiation.  No new hemorrhage. Vascular: No new finding. Skull: Right craniotomy.  The calvarium is otherwise unremarkable. Sinuses/Orbits: Paranasal sinus mucosal thickening. No acute orbital abnormality. Other: Mastoid air cells are clear. IMPRESSION: Evolving large right MCA territory infarction with similar mass effect. Petechial hemorrhage is present without discrete hematoma. Similar posterior left cerebral convexity mixed density subdural hematoma and remaining mixed density subdural hematoma underlying craniotomy. No new hemorrhage. Electronically Signed   By: Macy Mis M.D.   On: 08/12/2020 08:28    PHYSICAL EXAM GENERAL: intubated elderly Caucasian male HEENT: Head is atraumatic, normocephalic. Eyes: PERRL.  LUNGS: deep breathing sounds CARDIOVASCULAR: tachycardic  ABDOMEN: Bowel sounds present.  Ext : normal pulse distally  NEUROLOGICAL EXAM MS intubated, opens his eyes to tactile/verbal stimuli, follows simple commands on the R side CN 2-12:  R gaze  deviation, L facial asymmetry and weakness noted, no facial sensory deficits.  Does not blink to threat on the left but does on the right. Motor: : L sided hemiparesis with 2/5 strength.  Purposeful antigravity movements on the right side.  No Abnormal movements identified.  Sensory:  Unable to assess; seems intact. Toes: equivocal. No clonus Coordination: Unable to assess Gait was not tested considering pt's condition   ASSESSMENT/PLAN 1. 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 S/P thrombectomy with TICI2C. Pt was deemed not a candidate for TPA being outside the TPA window. Vascular risk factors: HTN, DM, afib HLD   CT head  hypodensity right MCA vessel sign, aspects 5, R SDH unchanged, decreased interhemispheric [facility] SAH. Stable extraaxial hemorrhage over the L parietal lobe measuring 10 mm. 4 mm left midline shift  CTA head & neck RICA occlusion shortely beyond origin with R M2 occlusion. Moderate L vert stenosis. ascending thoracic aorta measures up to 4 cm in diameter, severe right PCA P3/P4 branch stenosis.   CT perfusion 45 mL core infarct within the right MCA vascular territory 162 mL region of hypoperfusion predominantly within the right MCA vascular territory.Reported mismatch volume: 117 mL.   Repeated CT head today showed Evolved relatively large right MCA infarcts since 08/07/2020 with increased intracranial mass effect and increased leftward midline shift now 9 mm.   2D Echo lvef 60-65% SEVERELY dilated LA  LDL 56  HgbA1c 6.2   Lovenox for VTE prophylaxis Diet Order            Diet NPO time specified  Diet effective now                 No antiplatelet prior to hospitalization; currently on ASA 325 mg per tube   Therapy recommendations:   Pending  Disposition:  Pending  Hypertension . Maintain SBP <160 for now.  . Restart Oral meds when pt is able to get. Pt is on Toprol 75 mg bid and losartan 25 mg po daily and lasix 20 mg po daily . Long-term BP goal <!40/90  Hyperlipidemia  Home meds:  Lipitor 80 mg qhs  LDL  47, goal < 70  Lipitor restarted per tube  Continue statin at discharge  Diabetes type II   HgbA1c 6.2, goal < 7.0  Maintain BG 80-180  CBGs Recent Labs    08/12/20 0351 08/12/20 0819 08/12/20 1203  GLUCAP 196* 177* 141*      SSI  Dysarthria, aphasia, dysphasia- d/t stroke Dysphagia following cerebral infarction  -NPO until cleared by speech once extubated -ST following -Advance diet as tolerated -May need PEG; will place Cortrak now for nutriton as currently OG is being used for meds only.  Hemiplegia and hemiparesis following cerebral infarction affecting left non-dominant side  -PT/OT -PM&R consult  RESP Acute Respiratory Failure  -Appreciate vent management per ICU -wean when able  Paroxysmal atrial fibrillation -Rate control, continue metoprolol and home dofetilide -Hold anticoagulation for now pending clinical course. On ASA   Thoracic aortic aneurysm -Known problem is being followed in [place], recent imaging in May -Follow-up outside records  HEME No active anemia -Monitor -transfuse for hgb < 7   GI/GU BPH -Gentle hydration -avoid nephrotoxic agents -Continue home medications  Fluid/Electrolyte Disorders -Monitor -Replete as needed  ID Possible PNA, WBC 17.5 >16.4, CXR with L basilar consolidation  -On Unasyn  IV -NPO -Monitor  OTHER ISSUES:  Cerebral edema:  -CT head 6/5 (post stroke day 3, which will be near peak) showed increased cerebral edema with 9 mm mildline shift. Hypertonic saline started for 2 days and if stable can wean off. Baseline Na 135 -CTH 6/6-stable, decreased mass effect. [id number]  -Increase Hypertonic saline 3% NS to 60  cc/h, NA goal 150-155  - Check Na q6h  Ruta Hinds, NP  [facility] Nurse Practitioner  Patient seen and discussed with attending physician Dr. [name]     .  Patient continues to require ventilatory support for respiratory failure due to cytotoxic edema from his large stroke.  Continue hypertonic saline drip at 60 cc an hour for serum sodium goal between 150-155.  Plan to start weaning hypertonic saline from tomorrow over 24 hours and stopping.  May consider extubation as tolerated if CCM agrees.  Long discussion with his wife at the bedside and answered questions.  Discussed with Dr. Tacy Learn critical care medicine. This patient is critically ill and at significant risk of neurological worsening, death and care requires constant monitoring of vital signs, hemodynamics,respiratory and cardiac monitoring, extensive review of multiple databases, frequent neurological assessment, discussion with family, other specialists and medical decision making of high complexity.I have made any additions or clarifications directly to the above note.This critical care time does not reflect procedure time, or teaching time or supervisory time of PA/NP/Med Resident etc but could involve care discussion time.  I spent 32 minutes of neurocritical care time  in the care of  this patient.     Antony Contras, MD Medical Director [place] Pager: [phone] 08/12/2020 2:12 PM  To contact Stroke Continuity provider, please refer to [email or web address] After hours, contact General Neurology

## 2020-08-12 NOTE — Progress Notes (Addendum)
Orthopedic Tech Progress Note Patient Details:  [name] [name] 1940/08/20 [id number] Sorry charting for the wrong patient. Patient ID: [name] [name], male   DOB: 1940/06/11, 80 y.o.   MRN: [id number]   [name] 08/12/2020, 8:50 AM

## 2020-08-12 NOTE — Progress Notes (Signed)
NAME:  [name] [name], MRN:  [id number], DOB:  03-Nov-1940, LOS: 5 ADMISSION DATE:  08/07/2020, CONSULTATION DATE:  08/07/2020 REFERRING MD:  Estanislado Pandy - NIR, CHIEF COMPLAINT:  AMS - R MCA CVA - Code Stroke  History of Present Illness:  80 year old male with PMHx significant for AFib (on warfarin), thoracic aortic aneurysm, rheumatic pericarditis s/p AVR/MVR and CHF recently admitted for traumatic SDH following a fall while on warfarin. Patient was admitted 5/23 and taken for craniotomy/hematoma evaculation, ultimately discharged to [facility] 5/27 where he has remained. Warfarin continued to be held in CIR with plan to resume 6/3.   On 6/3, patient was noted to be somnolent and Code Stroke was initiated. CT Head revealed a large R MCA CVA. Patient was taken urgently for thrombectomy with TICI 2c revascularization and remained intubated following the case. LLL opacities were noted on CXR prompting coverage for PNA (Unasyn).  PCCM is asked to consult for ventilator management.  Pertinent  Medical History  Traumatic SDH (07/2020, ground level fall, on warfarin)  AFib/flutter Aortic aneurysm  CHF Hemorrhoids TIA  HLD Rheumatic pericarditis  S/p MVR AVR   Significant Hospital Events: Including procedures, antibiotic start and stop dates in addition to other pertinent events   . 6/3 - AMS found to have large R MCA CVA taken to [facility] and admitted to neuro ICU following procedure, remains intubated. WBC 17, LLL opacities -- starting Unasyn . 6/5 - Increased somnolence, CT Head demonstrating 69mm midline shift and increased IC mass effect/edema, hypertonic 3% started. Tolerated short PSV/CPAP wean. . 6/6 - CT Head: decreased mass effect, right MCA infarct again seen.  . 6/8 - Tolerating vent wean, CT with evolving large R MCA infarct, similar mass effect, no new hemorrhage. Continues on hypertonic 3%.  Interim History / Subjective:  No significant overnight events Wife at bedside this AM Nodding  appropriately to questions, following commands Denies pain Weaning on rounds, possible extubation today CT Head repeated today  Objective   Blood pressure (!) 144/70, pulse 61, temperature 98.6 F (37 C), temperature source Axillary, resp. rate 19, height 5\' 7"  (1.702 m), weight 89.6 kg, SpO2 100 %.    Vent Mode: PSV;CPAP FiO2 (%):  [30 %] 30 % Set Rate:  [18 bmp] 18 bmp Vt Set:  [530 mL-540 mL] 530 mL PEEP:  [5 cmH20] 5 cmH20 Pressure Support:  [5 cmH20] 5 cmH20 Plateau Pressure:  [12 cmH20-16 cmH20] 15 cmH20   Intake/Output Summary (Last 24 hours) at 08/12/2020 0929 Last data filed at 08/12/2020 0600 Gross per 24 hour  Intake 1092.73 ml  Output 1100 ml  Net -7.27 ml   Filed Weights   08/07/20 2020 08/09/20 0400  Weight: 84.5 kg 89.6 kg   Physical Examination: General: Acutely ill-appearing elderly gentleman in NAD. HEENT: [place]/AT, anicteric sclera, PERRL, moist mucous membranes. ETT in place. Neuro: Awake, alert to voice. Responds to verbal stimuli. Following commands consistently with RUE. Moves all 4 extremities spontaneously. +Cough and +Gag  CV: RRR, no m/g/r. PULM: Breathing even and unlabored on vent (PS 5/5). Lung fields CTAB. GI: Soft, nontender, nondistended. Normoactive bowel sounds. Extremities: Trace LE edema noted. Skin: Warm/dry, no rashes.  Imaging that I have personally reviewed: (right click and "Reselect all SmartList Selections" daily)   CT Head 6/8  IMPRESSION: Evolving large right MCA territory infarction with similar mass effect. Petechial hemorrhage is present without discrete hematoma.  Similar posterior left cerebral convexity mixed density subdural hematoma and remaining mixed density subdural hematoma underlying craniotomy.  No new hemorrhage.  Resolved Hospital Problem list     Assessment & Plan:   Acute right MCA CVA s/p thrombectomy with TICI 2c revascularization, mass effect, 7.33mm R-L shift Cytotoxic edema Last CT Head 6/8 with  evolving large R MCA territory infarct and similar mass effect - Management per Neuro/NIR - Goal SBP 120-140 - Continue hypertonic saline 3% - Goal Na 150-155, trend Q4H - AC plan per Neuro  Acute respiratory failure requiring mechanical ventilation  In the setting of CVA and procedure. - Wean vent support as able today, 6/8 - Mental status/lung mechanics improving, may extubate if tolerating well - Wean FiO2 for O2 sat > 90% - Daily WUA/SBT - VAP bundle - Pulmonary hygiene - Off of sedation at present  Leukocytosis WBC 16, reactive vs. infectious vs. related to [place]. Has a LLL opacity, but PCT < 0.1 x 2 and afebrile, covered with Unasyn empirically. No DVT. - Trend WBC, fever curve - S/p empiric Unasyn course  Hx recent traumatic SDH  [place] +[place]. Admitted 5/23, taken for evac discharged to [facility] 5/27. Initial plan to resume warfarin 6/3 - SDH appears to be mixed density of latest CT [place] 6/8, grossly stable - AC resumption per Neuro/NSGY  Hx Afib/Aflutter S/p AVR MVR (bioprosthetic) HTN Anticoagulation held following SDH, plan was restart warfarin 6/3 before requiring ICU admission. - AC per Neuro - Continue metoprolol, goal SBP 120-149 - Continue Tikosyn  BPH - Continue Flomax, Proscar - Foley in situ  Inadequate PO intake  - TF per RDN  Best practice (right click and "Reselect all SmartList Selections" daily)  Diet:  NPO Pain/Anxiety/Delirium protocol (if indicated): Yes (RASS goal 0) VAP protocol (if indicated): Yes DVT prophylaxis: SCD GI prophylaxis: PPI Glucose control:  SSI No Central venous access:  N/A Arterial line:  N/A Foley:  Yes, and it is still needed Mobility:  bed rest  PT consulted: N/A Last date of multidisciplinary goals of care discussion [per primary]  Code Status:  full code Disposition: ICU   Critical care time: 38 minutes    Lestine Mount, PA-C [facility] 08/12/20 9:42 AM  Please  see [email or web address] for pager details.  From 7A-7P if no response, please call [phone] After hours, please call ELink [phone]

## 2020-08-12 NOTE — Evaluation (Signed)
Physical Therapy Evaluation Patient Details Name: [name] [name] MRN: [id number] DOB: 12-27-1940 Today's Date: 08/12/2020   History of Present Illness  Pt is a 80 y.o. male who presented 6/3 from CIR with acute L sided weakness after being admitted there 5/27. 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 Intubated 6/3. Of note, pt recently in hospital recently admitted 5/22 after awakening from nap with AMS.  EMS was activated, however pt decompenstated quickly.  CT of head showed acute Rt much larger than Lt SDH with mass effect and 1.8 cm midline shift with trapping of lateral ventricle.  Pt was decerebrate posturing with non reactive pupils and decsion was made not to proceed with craniotomy. He was intubated with plan for son to arrive from [place].  He improved over next 24 hours with increased command following and responsiveness.  He underwent craniotomy on 5/24 for evacuation of SDH and was extubated  5/25.  He  had sustained a fall the day PTA and hit the back of his head, seen in ED with negative head CT).  PMH includes:  thoracic aortic aneurysm; stroke 2015; h/o MVR, CHF, A-Fib with multiple cardioversions.    Clinical Impression  Pt presents with condition above and deficits mentioned below, see PT Problem List. Pt is known to this PT from his prior admission 5/22. Currently, it is difficult to formally assess his cognitive status due to his lethargic state and being intubated, but he is following simple single step multi-modal commands inconsistently with delayed response. Pt is demonstrating increased difficulty attending to his L side, inability to track across midline towards his L, and L sided weakness. Pt also with poor overall coordination and balance placing him at high risk for falls. Pt needing modAx2 for bed mobility and maxAx2 for sit <> stand transfers at EOB this date. Pt's wife reports they just moved into a level entry 1-level home at an ILF.  Pt would greatly benefit from intensive therapy in the CIR setting to maximize his independence and safety with all functional mobility prior to return home. Will continue to follow acutely.     Follow Up Recommendations CIR;Supervision/Assistance - 24 hour    Equipment Recommendations  Rolling walker with 5" wheels;3in1 (PT);Wheelchair (measurements PT);Wheelchair cushion (measurements PT) (may change with progression)    Recommendations for Other Services Rehab consult     Precautions / Restrictions Precautions Precautions: Fall Precaution Comments: R wrist restraint, L mitten, cortrak, intubated on vent Restrictions Weight Bearing Restrictions: No      Mobility  Bed Mobility Overal bed mobility: Needs Assistance Bed Mobility: Supine to Sit;Sit to Supine     Supine to sit: Mod assist;+2 for physical assistance;+2 for safety/equipment;HOB elevated Sit to supine: Mod assist;+2 for physical assistance;+2 for safety/equipment;HOB elevated   General bed mobility comments: Pt needing modAx2 to initiate and manage legs off EOB and ascend trunk while managing lines/tubes. ModAx2 to cue pt to lean laterally onto R elbow to descend trunk and lift legs to return to supine.    Transfers Overall transfer level: Needs assistance Equipment used: 2 person hand held assist Transfers: Sit to/from Stand Sit to Stand: Max assist;+2 physical assistance;+2 safety/equipment;From elevated surface         General transfer comment: x2 sit <> stand from elevated EOB with bil HHA, clearing buttocks but not coming to full hip extension the first rep but achieving full upright standing posture the second rep with maxAx2 at  buttocks and intermittent bil knee block for safety.  Ambulation/Gait             General Gait Details: Unable at this time.  Stairs            Wheelchair Mobility    Modified Rankin (Stroke Patients Only) Modified Rankin (Stroke Patients Only) Pre-Morbid Rankin  Score: Moderately severe disability Modified Rankin: Severe disability     Balance Overall balance assessment: Needs assistance Sitting-balance support: Feet supported;Single extremity supported;Bilateral upper extremity supported Sitting balance-Leahy Scale: Poor Sitting balance - Comments: 1-2 UE support with modA posteriorly and laterally at L initially, progressing to min guard-minA for static sitting balance at EOB. Postural control: Left lateral lean;Posterior lean Standing balance support: Bilateral upper extremity supported Standing balance-Leahy Scale: Poor Standing balance comment: MaxAx2 and bil UE support to statically stand for a brief period, x2 bouts.                             Pertinent Vitals/Pain Pain Assessment: Faces Faces Pain Scale: Hurts a little bit Pain Location: generalized with movement and when laying supine Pain Descriptors / Indicators: Grimacing Pain Intervention(s): Limited activity within patient's tolerance;Monitored during session;Repositioned    Home Living Family/patient expects to be discharged to:: Other (Comment) (ILF) Living Arrangements: Spouse/significant other Available Help at Discharge: Family;Available 24 hours/day Type of Home: Other(Comment) (ILF apartment) Home Access: Level entry     Home Layout: One level   Additional Comments: Pt has a shower bench built-in to the side of the handheld shower head. x1 vertical grab bar at site of handheld shower head.    Prior Function Level of Independence: Independent with assistive device(s)         Comments: Prior to his admission 07/26/20, pt utilized a walking stick for ambulation on unlevel surfaces.  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[employer]   Dominant Hand: Right    Extremity/Trunk Assessment   Upper Extremity Assessment Upper Extremity Assessment: Defer to OT evaluation    Lower Extremity Assessment Lower Extremity Assessment:  Generalized weakness;LLE deficits/detail LLE Deficits / Details: Spontaneous movement of L lower extremity in supine and sitting against gravity, activating his L leg muscles for functional mobility but having difficulty following commands LLE Coordination: decreased gross motor    Cervical / Trunk Assessment Cervical / Trunk Assessment: Kyphotic  Communication   Communication: Other (comment) (intubated)  Cognition Arousal/Alertness: Lethargic Behavior During Therapy: Impulsive Overall Cognitive Status: Difficult to assess Area of Impairment: Following commands;Safety/judgement;Awareness;Problem solving                       Following Commands: Follows one step commands inconsistently;Follows one step commands with increased time Safety/Judgement: Decreased awareness of safety;Decreased awareness of deficits Awareness: Intellectual Problem Solving: Slow processing;Decreased initiation;Difficulty sequencing;Requires verbal cues;Requires tactile cues General Comments: Pt lethargic upon arrival, needing stimulation to open eyes. Pt maintaining eyes open better in sitting position EOB, wuickly falling back to sleep once returned to supine again end of session. Pt needing increased time to process and follow single step multi-modal commands inconsistently, especially with his L side. Poor awareness into his deficits and safety as he tends to not attend to his L side and unable to cross midline with his eyes towards his L. Pt also impulsively reaching towards his mouth and tube with his R hand, needing cues to avoid intermittently. Pt utilizes his L  side and follows cues to the L more when his wife is able to assist during the session.      General Comments General comments (skin integrity, edema, etc.): Intubated on vent settings: CPAP 30% PEEP 5; BP 158/79 MAP 102 supine > sit; HR 65-85; educated pt's wife to begin touching pt's L side and cuing pt to use L side and attend to L     Exercises     Assessment/Plan    PT Assessment Patient needs continued PT services  PT Problem List Decreased strength;Decreased range of motion;Decreased activity tolerance;Decreased balance;Decreased mobility;Decreased coordination;Decreased cognition;Decreased knowledge of use of DME;Decreased safety awareness;Decreased knowledge of precautions;Cardiopulmonary status limiting activity       PT Treatment Interventions DME instruction;Gait training;Stair training;Functional mobility training;Therapeutic activities;Therapeutic exercise;Balance training;Neuromuscular re-education;Cognitive remediation;Patient/family education    PT Goals (Current goals can be found in the Care Plan section)  Acute Rehab PT Goals Patient Stated Goal: did not state; wife desires for pt to improve PT Goal Formulation: With patient/family Time For Goal Achievement: 08/26/20 Potential to Achieve Goals: Good    Frequency Min 4X/week   Barriers to discharge        Co-evaluation PT/OT/SLP Co-Evaluation/Treatment: Yes Reason for Co-Treatment: Complexity of the patient's impairments (multi-system involvement);Necessary to address cognition/behavior during functional activity;To address functional/ADL transfers;For patient/therapist safety PT goals addressed during session: Mobility/safety with mobility;Balance OT goals addressed during session: ADL's and self-care;Proper use of Adaptive equipment and DME;Strengthening/ROM       AM-PAC PT "6 Clicks" Mobility  Outcome Measure Help needed turning from your back to your side while in a flat bed without using bedrails?: A Lot Help needed moving from lying on your back to sitting on the side of a flat bed without using bedrails?: A Lot Help needed moving to and from a bed to a chair (including a wheelchair)?: A Lot Help needed standing up from a chair using your arms (e.g., wheelchair or bedside chair)?: A Lot Help needed to walk in hospital room?: Total Help  needed climbing 3-5 steps with a railing? : Total 6 Click Score: 10    End of Session Equipment Utilized During Treatment: Gait belt;Oxygen Activity Tolerance: Patient tolerated treatment well Patient left: in bed;with call bell/phone within reach;with bed alarm set;with family/visitor present;with restraints reapplied;with SCD's reapplied Nurse Communication: Mobility status;Other (comment) (wife to watch pt with L mitten doffed and ask for RN to re-donn if not present to supervise pt) PT Visit Diagnosis: Unsteadiness on feet (R26.81);Muscle weakness (generalized) (M62.81);History of falling (Z91.81);Difficulty in walking, not elsewhere classified (R26.2);Other symptoms and signs involving the nervous system (R29.898);Hemiplegia and hemiparesis Hemiplegia - Right/Left: Left Hemiplegia - dominant/non-dominant: Non-dominant Hemiplegia - caused by: Cerebral infarction    Time: 1025-1055 PT Time Calculation (min) (ACUTE ONLY): 30 min   Charges:   PT Evaluation $PT Eval Moderate Complexity: 1 Mod          Moishe Spice, PT, DPT Acute Rehabilitation Services  Pager: [phone] Office: [phone]   Orvan Falconer 08/12/2020, 11:32 AM

## 2020-08-12 NOTE — Progress Notes (Signed)
SLP Cancellation Note  Patient Details Name: KAAN TOSH MRN: [id number] DOB: 08/12/1940   Cancelled treatment:       Reason Eval/Treat Not Completed: Patient not medically ready. Pt intubated   Hudsyn Champine, Katherene Ponto 08/12/2020, 9:02 AM

## 2020-08-12 NOTE — Progress Notes (Signed)
[facility] ADULT ICU REPLACEMENT PROTOCOL   The patient does apply for the [facility] Adult ICU Electrolyte Replacment Protocol based on the criteria listed below:   1. Is GFR >/= 30 ml/min? Yes.    Patient's GFR today is >60 2. Is SCr </= 2? Yes.   Patient's SCr is 0.73 ml/kg/hr 3. Did SCr increase >/= 0.5 in 24 hours? No. 4. Abnormal electrolyte(s): *k 3.3 5. Ordered repletion with: protocol 6. If a panic level lab has been reported, has the CCM MD in charge been notified? No..   Physician:    Ronda Fairly A 08/12/2020 11:31 PM

## 2020-08-12 NOTE — Procedures (Signed)
Extubation Procedure Note  Patient Details:   Name: [name] [name] DOB: 02-08-1941 MRN: [id number]   Airway Documentation:    Vent end date: 08/12/20 Vent end time: 1215   Evaluation  O2 sats: stable throughout Complications: No apparent complications Patient did tolerate procedure well. Bilateral Breath Sounds: Clear,Diminished   No   Pt extubated per order to 3L [place] with no appearant complications. RN and MD at bedside. positive cuff leak prior to extubation. Vitals stable and RT will continue to monitor.   Carren Rang 08/12/2020, 12:28 PM

## 2020-08-12 NOTE — Progress Notes (Signed)
Patient transported to CT for scan and back to 4N30 with no events to report.

## 2020-08-12 NOTE — Evaluation (Signed)
Occupational Therapy Evaluation Patient Details Name: [name] [name] MRN: [id number] DOB: 1940-07-03 Today's Date: 08/12/2020    History of Present Illness 80 y.o. 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 pt revascularized TICI 2 6/3.  Intubated 6/3. Of note, recently admitted 5/22 CT of head showed acute Rt much larger than Lt SDH with mass effect and 1.8 cm midline shift with trapping of lateral ventricle.  Pt was decerebrate posturing with non reactive pupils. He underwent craniotomy on 5/24 for evacuation of SDH and was extubated  5/25. pt went to CIR on 5/27.   PMH includes:  thoracic aortic aneurysm; stroke 2015; h/o MVR, CHF, A-Fib with multiple cardioversions.   Clinical Impression   PT admitted with from CIR with RMCA revascularized. Pt currently with functional limitiations due to the deficits listed below (see OT problem list). Pt currently on vent following 1 step commands with R gaze preference. Pt completed sit<>Stand MAx +2 (A). Wife with a lot of questions about if pt will talk and if he will go back to cir. Wife educated that today was good session and recommendations are for CIR but will have to wait for extubation for any language speech evaluations.  Pt will benefit from skilled OT to increase their independence and safety with adls and balance to allow discharge CIR.     Follow Up Recommendations  CIR;Supervision/Assistance - 24 hour    Equipment Recommendations  Other (comment) (TBA)    Recommendations for Other Services Rehab consult     Precautions / Restrictions Precautions Precautions: Fall Precaution Comments: R wrist restraint, L mitten, cortrak, intubated on vent Restrictions Weight Bearing Restrictions: No      Mobility Bed Mobility Overal bed mobility: Needs Assistance Bed Mobility: Supine to Sit;Sit to Supine     Supine to sit: Mod assist;+2 for physical  assistance;+2 for safety/equipment;HOB elevated Sit to supine: Mod assist;+2 for physical assistance;+2 for safety/equipment;HOB elevated   General bed mobility comments: Pt needing modAx2 to initiate and manage legs off EOB and ascend trunk while managing lines/tubes. ModAx2 to cue pt to lean laterally onto R elbow to descend trunk and lift legs to return to supine.    Transfers Overall transfer level: Needs assistance Equipment used: 2 person hand held assist Transfers: Sit to/from Stand Sit to Stand: Max assist;+2 physical assistance;+2 safety/equipment;From elevated surface         General transfer comment: x2 sit <> stand from elevated EOB with bil HHA, clearing buttocks but not coming to full hip extension the first rep but achieving full upright standing posture the second rep with maxAx2 at buttocks and intermittent bil knee block for safety.    Balance Overall balance assessment: Needs assistance Sitting-balance support: Feet supported;Single extremity supported;Bilateral upper extremity supported Sitting balance-Leahy Scale: Poor Sitting balance - Comments: 1-2 UE support with modA posteriorly and laterally at L initially, progressing to min guard-minA for static sitting balance at EOB. Postural control: Left lateral lean;Posterior lean Standing balance support: Bilateral upper extremity supported Standing balance-Leahy Scale: Poor Standing balance comment: MaxAx2 and bil UE support to statically stand for a brief period, x2 bouts.                           ADL either performed or assessed with clinical judgement   ADL Overall ADL's : Needs assistance/impaired Eating/Feeding: NPO  General ADL Comments: Pt require (A) for all adls at this time     Vision Baseline Vision/History: Wears glasses Wears Glasses: Reading only Vision Assessment?: Vision impaired- to be further tested in functional context Ocular  Range of Motion: Impaired-to be further tested in functional context Additional Comments: R gaze preference does not pass midline     Perception Perception Perception Tested?: Yes Perception Deficits: Inattention/neglect Inattention/Neglect: Does not attend to left visual field;Does not attend to left side of body   Praxis      Pertinent Vitals/Pain Pain Assessment: Faces Faces Pain Scale: Hurts a little bit Pain Location: generalized Pain Descriptors / Indicators: Grimacing Pain Intervention(s): Monitored during session;Repositioned     Hand Dominance Right   Extremity/Trunk Assessment Upper Extremity Assessment Upper Extremity Assessment: LUE deficits/detail LUE Deficits / Details: 4 / 5 grossly when activated. pt will spontaneously use arm but can not get to follow command. pt reaching for wife with L hand during session LUE Coordination: decreased fine motor;decreased gross motor   Lower Extremity Assessment Lower Extremity Assessment: Defer to PT evaluation LLE Deficits / Details: Spontaneous movement of L lower extremity in supine and sitting against gravity, activating his L leg muscles for functional mobility but having difficulty following commands LLE Coordination: decreased gross motor   Cervical / Trunk Assessment Cervical / Trunk Assessment: Kyphotic   Communication Communication Communication: Other (comment) (intubated)   Cognition Arousal/Alertness: Lethargic Behavior During Therapy: Impulsive Overall Cognitive Status: Difficult to assess Area of Impairment: Following commands;Safety/judgement;Awareness;Problem solving                       Following Commands: Follows one step commands inconsistently;Follows one step commands with increased time Safety/Judgement: Decreased awareness of safety;Decreased awareness of deficits Awareness: Intellectual Problem Solving: Slow processing;Decreased initiation;Difficulty sequencing;Requires verbal  cues;Requires tactile cues General Comments: Pt with increased arousal with EOB sitting and opening eyes with R gaze preference. Pt requires increased time to respond to commands. L inattention   General Comments  vent CPap 30% peep 5 VSS    Exercises Exercises: Other exercises Other Exercises Other Exercises: encouraged RN staff to allow wife to have mitten off L hand to help with activation of L UE with full supervision   Shoulder Instructions      Home Living Family/patient expects to be discharged to::  ([place] living at [place]) Living Arrangements: Spouse/significant other Available Help at Discharge: Family;Available 24 hours/day Type of Home: Other(Comment) Home Access: Level entry     Home Layout: One level     Bathroom Shower/Tub: Occupational psychologist: Standard         Additional Comments: Pt has a shower bench built-in to the side of the handheld shower head. x1 vertical grab bar at site of handheld shower head.  Lives With: Spouse    Prior Functioning/Environment Level of Independence: Independent with assistive device(s)        Comments: Prior to his admission 07/26/20, pt utilized a walking stick for ambulation on unlevel surfaces.  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[employer], educational Problem List: Decreased strength;Decreased activity tolerance;Impaired balance (sitting and/or standing);Decreased cognition;Decreased safety awareness;Decreased knowledge of use of DME or AE;Cardiopulmonary status limiting activity;Decreased knowledge of precautions;Obesity;Impaired UE functional use;Impaired vision/perception;Decreased coordination      OT Treatment/Interventions: Self-care/ADL training;Therapeutic exercise;Energy conservation;Neuromuscular education;DME and/or AE instruction;Manual therapy;Modalities;Therapeutic activities;Cognitive remediation/compensation;Visual/perceptual remediation/compensation;Patient/family education;Balance  training    OT Goals(Current goals can be  found in the care plan section) Acute Rehab OT Goals Patient Stated Goal: intubated wife wants him to go back to CIR OT Goal Formulation: With patient/family Time For Goal Achievement: 08/26/20 Potential to Achieve Goals: Good  OT Frequency: Min 2X/week   Barriers to D/C:            Co-evaluation PT/OT/SLP Co-Evaluation/Treatment: Yes Reason for Co-Treatment: Complexity of the patient's impairments (multi-system involvement);Necessary to address cognition/behavior during functional activity;To address functional/ADL transfers;For patient/therapist safety PT goals addressed during session: Mobility/safety with mobility;Balance OT goals addressed during session: ADL's and self-care;Proper use of Adaptive equipment and DME;Strengthening/ROM      AM-PAC OT "6 Clicks" Daily Activity     Outcome Measure Help from another person eating meals?: Total Help from another person taking care of personal grooming?: A Lot Help from another person toileting, which includes using toliet, bedpan, or urinal?: A Lot Help from another person bathing (including washing, rinsing, drying)?: A Lot Help from another person to put on and taking off regular upper body clothing?: Total Help from another person to put on and taking off regular lower body clothing?: Total 6 Click Score: 9   End of Session Equipment Utilized During Treatment: Oxygen;Gait belt Nurse Communication: Mobility status;Precautions  Activity Tolerance: Patient tolerated treatment well Patient left: in bed;with call bell/phone within reach;with bed alarm set;with SCD's reapplied;with restraints reapplied;with family/visitor present  OT Visit Diagnosis: Unsteadiness on feet (R26.81);Cognitive communication deficit (R41.841) Symptoms and signs involving cognitive functions: Cerebral infarction                Time: 1025-1055 OT Time Calculation (min): 30 min Charges:  OT General Charges $OT  Visit: 1 Visit OT Evaluation $OT Eval Moderate Complexity: 1 Mod   Brynn, OTR/L  Acute Rehabilitation Services Pager: [phone] Office: [phone] .   Jeri Modena 08/12/2020, 11:39 AM

## 2020-08-12 NOTE — Progress Notes (Signed)
This patient was noted to have three episodes of ventricular tachycardia. A 7 beat run at [place], a 10 beat run at 2022, and a 13 beat run at 2118. Patient did not appear to be bothered. It was noted that electrolytes had not been taken in two days.  Notified Gretchen with [facility] of assessment findings and a STAT BMP was ordered by her.   Labs came back with no critical values. Potassium slightly low at 3.3. Notified [facility] of results of BMP, and potassium chloride packet 75mEq per tube and potassium chloride 86mEq IVPB every 1hrx4hrs was ordered. Will continue with treatment and continue to monitor patient.   Wyn Quaker, RN

## 2020-08-13 LAB — GLUCOSE, CAPILLARY
Glucose-Capillary: 141 mg/dL — ABNORMAL HIGH (ref 70–99)
Glucose-Capillary: 146 mg/dL — ABNORMAL HIGH (ref 70–99)
Glucose-Capillary: 154 mg/dL — ABNORMAL HIGH (ref 70–99)
Glucose-Capillary: 158 mg/dL — ABNORMAL HIGH (ref 70–99)
Glucose-Capillary: 158 mg/dL — ABNORMAL HIGH (ref 70–99)
Glucose-Capillary: 164 mg/dL — ABNORMAL HIGH (ref 70–99)

## 2020-08-13 LAB — PHOSPHORUS: Phosphorus: 2.2 mg/dL — ABNORMAL LOW (ref 2.5–4.6)

## 2020-08-13 LAB — MAGNESIUM: Magnesium: 2.4 mg/dL (ref 1.7–2.4)

## 2020-08-13 LAB — VITAMIN B12: Vitamin B-12: 3210 pg/mL — ABNORMAL HIGH (ref 180–914)

## 2020-08-13 MED ORDER — FLEET ENEMA 7-19 GM/118ML RE ENEM
1.0000 | ENEMA | Freq: Once | RECTAL | Status: DC
  Filled 2020-08-13: qty 1

## 2020-08-13 MED ORDER — K PHOS MONO-SOD PHOS DI & MONO 155-852-130 MG PO TABS
500.0000 mg | ORAL_TABLET | Freq: Three times a day (TID) | ORAL | Status: AC
  Administered 2020-08-13 – 2020-08-14 (×3): 500 mg
  Filled 2020-08-13 (×4): qty 2

## 2020-08-13 MED ORDER — AMANTADINE HCL 100 MG PO CAPS
100.0000 mg | ORAL_CAPSULE | Freq: Two times a day (BID) | ORAL | Status: DC
  Filled 2020-08-13 (×2): qty 1

## 2020-08-13 MED ORDER — AMANTADINE HCL 100 MG PO CAPS
100.0000 mg | ORAL_CAPSULE | Freq: Two times a day (BID) | ORAL | Status: DC
  Administered 2020-08-13 – 2020-08-15 (×4): 100 mg
  Filled 2020-08-13 (×6): qty 1

## 2020-08-13 NOTE — Evaluation (Signed)
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   External hemorrhoids without mention of complication    H/O aortic valve replacement    H/O mitral valve replacement    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   Lesion of ulnar nerve    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    Other and unspecified hyperlipidemia    Pre-diabetes    Previous back surgery 1978, jan 2007   Psychosexual dysfunction with inhibited sexual excitement    SOB (shortness of breath)    "with heavy exercise"   Stroke ([place]) 08/2013   "I WAS IN AFIB AND THREW A CLOT, THE EFFECTS WERE TRANSITORY AND DIDNT LAST BUT FOR 30 MINUTES"    Thoracic aortic aneurysm [facility])    Past Surgical History:  Past Surgical History:  Procedure Laterality Date   AORTIC AND MITRAL VALVE REPLACEMENT     09/2004   CARDIOVERSION     3 times from 2004-2006   CARDIOVERSION N/A 09/26/2013   Procedure: CARDIOVERSION;  Surgeon: Larey Dresser, MD;  Location: [place];  Service: Cardiovascular;  Laterality: N/A;   CARDIOVERSION N/A 06/19/2014   Procedure: CARDIOVERSION;  Surgeon: Jerline Pain, MD;  Location: [place];  Service: Cardiovascular;  Laterality: N/A;   CARDIOVERSION N/A 04/24/2017   Procedure: CARDIOVERSION;  Surgeon: Larey Dresser, MD;  Location: [place];  Service: Cardiovascular;  Laterality: N/A;   CARDIOVERSION N/A 06/08/2017   Procedure: CARDIOVERSION;  Surgeon: Pixie Casino,  MD;  Location: [facility] ENDOSCOPY;  Service: Cardiovascular;  Laterality: N/A;   CARDIOVERSION N/A 08/24/2017   Procedure: CARDIOVERSION;  Surgeon: Skeet Latch, MD;  Location: [facility] ENDOSCOPY;  Service: Cardiovascular;  Laterality: N/A;   CARDIOVERSION N/A 02/14/2018   Procedure: CARDIOVERSION;  Surgeon: Larey Dresser, MD;  Location: [facility] ENDOSCOPY;  Service: Cardiovascular;  Laterality: N/A;   COLONOSCOPY     CRANIOTOMY N/A 07/28/2020   Procedure: CRANIOTOMY FOR EVACUATION OF SUBDURAL HEMATOMA;  Surgeon: Eustace Coreas, MD;  Location: [place];  Service: Neurosurgery;  Laterality: N/A;   IR ANGIO VERTEBRAL SEL SUBCLAVIAN INNOMINATE UNI R MOD SED  08/07/2020   IR CT HEAD LTD  08/07/2020   IR PERCUTANEOUS ART THROMBECTOMY/INFUSION INTRACRANIAL INC DIAG ANGIO  08/07/2020   laminectomies     10/1975 and in 03/2005   RADIOLOGY WITH ANESTHESIA N/A 08/07/2020   Procedure: IR WITH ANESTHESIA;  Surgeon: Luanne Bras, MD;  Location: [place];  Service: Radiology;  Laterality: N/A;   TONSILLECTOMY     1950   TOTAL HIP ARTHROPLASTY Right 04/03/2018   Procedure: TOTAL HIP ARTHROPLASTY ANTERIOR APPROACH;  Surgeon: Paralee Cancel, MD;  [facility];  Service: Orthopedics;  Laterality: Right;  70 mins   HPI:  80 y.o. 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 pt  revascularized TICI 2 6/3.  Intubated 6/3-6/8. Of note, recently admitted 5/22 CT of head showed acute Rt much larger than Lt SDH with mass effect and 1.8 cm midline shift with trapping of lateral ventricle.  Pt was decerebrate posturing with non reactive pupils. He underwent craniotomy on 5/24 for evacuation of SDH and was extubated  5/25. pt went to CIR on 5/27.   PMH includes:  thoracic aortic aneurysm; stroke 2015; h/o MVR, CHF, A-Fib with multiple cardioversions.   Assessment / Plan / Recommendation Clinical Impression  Abbreviated swallow evaluation with wife  present. We reviewed pt's function from initial admission to CIR and transfer back to acute. Goals of today's evaluation discussed from standpoint of pt's alertness, status of oral cavity, cognition, respiratory status and endurance. Eyes closed, vocalizing in hoarse low intensity, no verbalizations and unable to cough/throat clear. Weakness on left facial but did not follow commands for oral motor. He did, however wash face, hand cloth to his wife and rub gel on lips when asked, Lethargy only allowed for ice chip x 2 and 1/4 tsp water via spoon more to moisten oral cavity. Delayed swallow and throat clear noted on one occasion. Continue oral care, encourage cough and ST will continue moving towards po. SLP Visit Diagnosis: Dysphagia, unspecified (R13.10)    Aspiration Risk  Moderate aspiration risk;Severe aspiration risk    Diet Recommendation NPO   Medication Administration: Via alternative means    Other  Recommendations Oral Care Recommendations: Oral care QID   Follow up Recommendations Inpatient Rehab      Frequency and Duration min 2x/week  2 weeks       Prognosis Prognosis for Safe Diet Advancement: Good Barriers to Reach Goals: Cognitive deficits      Swallow Study   General HPI: 80 y.o. 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 pt revascularized TICI 2 6/3.  Intubated 6/3-6/8. Of note, recently admitted 5/22 CT of head showed acute Rt much larger than Lt SDH with mass effect and 1.8 cm midline shift with trapping of lateral ventricle.  Pt was decerebrate posturing with non reactive pupils. He underwent craniotomy on 5/24 for evacuation of SDH and was extubated  5/25. pt went to CIR on 5/27.   PMH includes:  thoracic aortic aneurysm; stroke 2015; h/o MVR, CHF, A-Fib with multiple cardioversions. Type of Study: Bedside Swallow Evaluation Previous Swallow Assessment: none Diet Prior to this  Study: NPO;NG Tube Temperature Spikes Noted: No Respiratory Status: Room air History of Recent Intubation: Yes Length of Intubations (days): 5 days Date extubated: 08/12/20 Behavior/Cognition: Lethargic/Drowsy;Requires cueing;Cooperative Oral Cavity Assessment: Dry Oral Care Completed by SLP: Yes Oral Cavity - Dentition: Adequate natural dentition Vision: Impaired for self-feeding Self-Feeding Abilities: Total assist Patient Positioning: Upright in bed Baseline Vocal Quality: Hoarse;Low vocal intensity Volitional Cough: Cognitively unable to elicit Volitional Swallow: Unable to elicit    Oral/Motor/Sensory Function Overall Oral Motor/Sensory Function: Mild impairment Facial ROM: Reduced left;Suspected CN VII (facial) dysfunction Facial Symmetry: Abnormal symmetry left;Suspected CN VII (facial) dysfunction Facial Strength: Reduced left;Suspected CN VII (facial) dysfunction Lingual ROM:  (unable)   Ice Chips Ice chips: Impaired Presentation: Spoon Oral Phase Impairments: Reduced labial seal;Reduced lingual movement/coordination;Poor awareness of bolus Oral Phase Functional Implications: Oral holding (slight manipulation) Pharyngeal Phase Impairments:  (no swallow observed)   Thin Liquid Thin Liquid: Impaired Presentation: Spoon Oral Phase Impairments: Reduced labial seal;Poor awareness of  bolus;Reduced lingual movement/coordination Oral Phase Functional Implications: Oral holding Pharyngeal  Phase Impairments: Suspected delayed Swallow    Nectar Thick Nectar Thick Liquid: Not tested   Honey Thick Honey Thick Liquid: Not tested   Puree Puree: Not tested   Solid     Solid: Not tested      Houston Siren 08/13/2020,11:57 AM  Orbie Pyo Colvin Caroli.Ed Risk analyst [phone] Office [phone]

## 2020-08-13 NOTE — Progress Notes (Signed)
Pt transferred to [address]. Wife at bedside.

## 2020-08-13 NOTE — Progress Notes (Signed)
NAME:  [name] [name], MRN:  [id number], DOB:  21-Apr-1940, LOS: 6 ADMISSION DATE:  08/07/2020, CONSULTATION DATE:  08/07/2020 REFERRING MD:  Genevie Cheshire, CHIEF COMPLAINT:  AMS - R MCA CVA - Code Stroke  Brief Narrative:  80 year old male originally admitted 5/23 for traumatic SDH following a fall while on warfarin and taken for craniotomy/hematoma evaculation, ultimately discharged to [facility] 5/27   6/3, patient seen somnolent and Code Stroke was initiated. CT Head revealed a large R MCA CVA. Patient was taken urgently for thrombectomy with TICI 2c revascularization and remained intubated following the case. LLL opacities were noted on CXR prompting coverage for PNA (Unasyn).  Pertinent  Medical History  Traumatic SDH (07/2020, ground level fall, on warfarin)  AFib/flutter Aortic aneurysm  CHF Hemorrhoids TIA  HLD Rheumatic pericarditis  S/p MVR AVR   Significant Hospital Events: Including procedures, antibiotic start and stop dates in addition to other pertinent events   6/3 - AMS found to have large R MCA CVA taken to [facility] and admitted to neuro ICU following procedure, remains intubated. WBC 17, LLL opacities -- starting Unasyn 6/5 - Increased somnolence, CT Head demonstrating 35mm midline shift and increased IC mass effect/edema, hypertonic 3% started. Tolerated short PSV/CPAP wean. 6/6 - CT Head: decreased mass effect, right MCA infarct again seen.  6/8 - Tolerating vent wean resulting in extubation , CT with evolving large R MCA infarct, similar mass effect, no new hemorrhage. Continues on hypertonic 3%. 6/9 Stable overnight in 3%, Na 150, K elevated at 5.7  Interim History / Subjective:  No acute issues overnight  Bedside nurse report episodes of VT overnight   Objective   Blood pressure 127/67, pulse 61, temperature 98.2 F (36.8 C), temperature source Axillary, resp. rate (!) 22, height 5\' 7"  (1.702 m), weight 90.3 kg, SpO2 95 %.    FiO2 (%):  [32 %] 32 %   Intake/Output  Summary (Last 24 hours) at 08/13/2020 0746 Last data filed at 08/13/2020 0600 Gross per 24 hour  Intake 2305.26 ml  Output 1130 ml  Net 1175.26 ml    Filed Weights   08/07/20 2020 08/09/20 0400 08/13/20 0500  Weight: 84.5 kg 89.6 kg 90.3 kg   Physical Examination: General: Acute on chronically ill appearing elderly male lying in bed, in NAD HEENT: [place].AT, MM pink/moist, PERRL,  Neuro: Sleepy but will arouse to physical stimuli, unable to follow commands this AM CV: s1s2 regular rate and rhythm, no murmur, rubs, or gallops,  PULM:  Clear to ascultation., no added breath sounds, no increased work of breathing  GI: soft, bowel sounds active in all 4 quadrants, non-tender, non-distended, tolerating TF Extremities: warm/dry, no edema  Skin: no rashes or lesions  Imaging that I have personally reviewed:  CT Head 6/8 >Evolving large right MCA territory infarction with similar mass effect. Petechial hemorrhage is present without discrete hematoma. Similar posterior left cerebral convexity mixed density subdural hematoma and remaining mixed density subdural hematoma underlying craniotomy.  Resolved Hospital Problem list   Acute respiratory failure requiring mechanical ventilation  -Extubated 6/9  Assessment & Plan:   Acute right MCA CVA  -s/p thrombectomy with TICI 2c revascularization, mass effect, 7.30mm R-L shift Cytotoxic edema -Last CT Head 6/8 with evolving large R MCA territory infarct and similar mass effect P: Management per neurology  Maintain neuro protective measures; goal for eurothermia, euglycemia, eunatermia, normoxia, and PCO2 goal of 35-40 Nutrition and bowel regiment  Seizure precautions  Aspirations precautions  SBP goal 120-140  3% saline wean off 6/8  Leukocytosis -Reactive vs. infectious vs. related to [place]. Has a LLL opacity, but PCT < 0.1 x 2 and afebrile, covered with Unasyn empirically. No DVT P: Trend CBC and fever curve  WBC improving  Remains off  ABT  Hx recent traumatic SDH  -[place] 5[place] with +LOC. Admitted 5/23, taken for evac discharged to [facility] 5/27. Initial plan to resume warfarin 6/3 -SDH appears to be mixed density of latest CT Hest 6/8, grossly stable P: AC resumption per Neuro and NSGY  Hx Afib/Aflutter S/p AVR MVR (bioprosthetic) HTN -Anticoagulation held following SDH, plan was restart warfarin 6/3 before requiring ICU admission. -Telemetry strip reviewed overnight 6/8-6/9 with multiple PVC otherwise rhythm is NSR P: Continuous telemetry  Continue Metoprolol SBP goal 120-140 Continue Tikosyn  AC resumption per Neuro   BPH P: Continue Flomac and Proscar  External urinary device in place   Inadequate PO intake  P: Continue TF  Hyperkalemia  -Potassium of 3.3 6/8 supplemented overnight of 6/9 with last run ending around 0415. Bmet was resulted at [place] concern for falsely elevated  P: Repeat Bmet at 1200 Continuous telemetry   Best practice (  Diet:  Tube Feed  Pain/Anxiety/Delirium protocol (if indicated): No VAP protocol (if indicated): Not indicated DVT prophylaxis: SCD GI prophylaxis: PPI Glucose control:  SSI No Central venous access:  N/A Arterial line:  N/A Foley:  N/A Mobility:  OOB  PT consulted: N/A Last date of multidisciplinary goals of care discussion per primary Code Status:  full code Disposition: Stable for transfer out of ICU per primary team - Critical care team can likely sign off soon   Critical care time:   Performed by: Johnsie Cancel  Total critical care time: 36 minutes  Critical care time was exclusive of separately billable procedures and treating other patients.  Critical care was necessary to treat or prevent imminent or life-threatening deterioration.  Critical care was time spent personally by me on the following activities: development of treatment plan with patient and/or surrogate as well as nursing, discussions with consultants, evaluation of  patient's response to treatment, examination of patient, obtaining history from patient or surrogate, ordering and performing treatments and interventions, ordering and review of laboratory studies, ordering and review of radiographic studies, pulse oximetry and re-evaluation of patient's condition.  Johnsie Cancel, NP-C [facility] Personal contact information can be found on Amion  08/13/2020, 8:04 AM

## 2020-08-13 NOTE — Progress Notes (Signed)
Staples to rt scalp removed per order from Birdena Jubilee NP.

## 2020-08-13 NOTE — Evaluation (Signed)
Speech Language Pathology Evaluation Patient Details Name: [name] [name] MRN: [id number] DOB: 12/05/1940 Today's Date: 08/13/2020 Time: 5462-7035 SLP Time Calculation (min) (ACUTE ONLY): 13 min  Problem List:  Patient Active Problem List   Diagnosis Date Noted   Leucocytosis 08/10/2020   CVA (cerebral vascular accident) ([place]) 08/07/2020   Middle cerebral artery embolism, 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   Subdural hemorrhage following injury ([place]) 07/31/2020   S/P craniotomy 07/28/2020   Subdural hematoma ([place]) 07/27/2020   Vertigo 08/30/2018   Obese 04/04/2018   S/P right THA, 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   Long term (current) use of anticoagulants 01/05/2018   History of food allergy 12/11/2017   Allergic reaction 12/11/2017   Emesis 12/11/2017   Visit for monitoring Tikosyn therapy 06/06/2017   Internal hemorrhoid, bleeding 05/19/2016   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   Aortic valve disorder 07/09/2015   Long term current use of anticoagulant therapy 01/19/2015   Chronic a-fib ([place]) 01/20/2014   Elevated PSA 12/23/2013   Prediabetes 12/19/2013   Benign prostatic hyperplasia with urinary obstruction 10/29/2013   left frontal cerebral infarct secondary to afib 08/05/2013   Atrial flutter ([place]) 05/17/2013   Essential hypertension 06/19/2012   Routine health maintenance 04/26/2011   Hyperlipidemia with target LDL less than 130 07/07/2008   History of colonic polyps 06/16/2008   ERECTILE DYSFUNCTION 09/25/2007   Past Medical History:  Past Medical History:  Diagnosis Date   Acute rheumatic heart disease, unspecified    childhood, age  52 & 69   Acute rheumatic pericarditis    Atrial fibrillation ([place])    history   CHF (congestive heart failure) ([place])    Diverticulosis    Dysrhythmia    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   External hemorrhoids without mention of complication    H/O aortic valve replacement    H/O mitral valve replacement    Lesion  of ulnar nerve    injury / left arm   Lesion of ulnar nerve    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    Other and unspecified hyperlipidemia    Pre-diabetes    Previous back surgery 1978, jan 2007   Psychosexual dysfunction with inhibited sexual excitement    SOB (shortness of breath)    "with heavy exercise"   Stroke ([place]) 08/2013   "I WAS IN AFIB AND THREW A CLOT, THE EFFECTS WERE TRANSITORY AND DIDNT LAST BUT FOR 30 MINUTES"    Thoracic aortic aneurysm [facility])    Past Surgical History:  Past Surgical History:  Procedure Laterality Date   AORTIC AND MITRAL VALVE REPLACEMENT     09/2004   CARDIOVERSION     3 times from 2004-2006   CARDIOVERSION N/A 09/26/2013   Procedure: CARDIOVERSION;  Surgeon: Larey Dresser, MD;  Location: [place];  Service: Cardiovascular;  Laterality: N/A;   CARDIOVERSION N/A 06/19/2014   Procedure: CARDIOVERSION;  Surgeon: Jerline Pain, MD;  Location: [place];  Service: Cardiovascular;  Laterality: N/A;   CARDIOVERSION N/A 04/24/2017   Procedure: CARDIOVERSION;  Surgeon: Larey Dresser, MD;  Location: [facility] ENDOSCOPY;  Service: Cardiovascular;  Laterality: N/A;   CARDIOVERSION N/A 06/08/2017   Procedure: CARDIOVERSION;  Surgeon: Pixie Casino, MD;  Location: [facility] ENDOSCOPY;  Service: Cardiovascular;  Laterality: N/A;   CARDIOVERSION N/A 08/24/2017   Procedure: CARDIOVERSION;  Surgeon: Skeet Latch, MD;  Location: [facility] ENDOSCOPY;  Service: Cardiovascular;  Laterality: N/A;   CARDIOVERSION N/A 02/14/2018   Procedure: CARDIOVERSION;  Surgeon: Larey Dresser, MD;  Location: [facility] ENDOSCOPY;  Service: Cardiovascular;  Laterality: N/A;   COLONOSCOPY     CRANIOTOMY N/A 07/28/2020   Procedure: CRANIOTOMY FOR EVACUATION OF SUBDURAL HEMATOMA;  Surgeon: Eustace Mohabir, MD;  Location: [place];  Service: Neurosurgery;  Laterality: N/A;   IR ANGIO VERTEBRAL SEL SUBCLAVIAN INNOMINATE UNI R MOD SED  08/07/2020   IR CT HEAD LTD  08/07/2020   IR PERCUTANEOUS ART  THROMBECTOMY/INFUSION INTRACRANIAL INC DIAG ANGIO  08/07/2020   laminectomies     10/1975 and in 03/2005   RADIOLOGY WITH ANESTHESIA N/A 08/07/2020   Procedure: IR WITH ANESTHESIA;  Surgeon: Luanne Bras, MD;  Location: [place];  Service: Radiology;  Laterality: N/A;   TONSILLECTOMY     1950   TOTAL HIP ARTHROPLASTY Right 04/03/2018   Procedure: TOTAL HIP ARTHROPLASTY ANTERIOR APPROACH;  Surgeon: Paralee Cancel, MD;  [facility];  Service: Orthopedics;  Laterality: Right;  70 mins   HPI:  80 y.o. 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 pt revascularized TICI 2 6/3.  Intubated 6/3-6/8. Of note, recently admitted 5/22 CT of head showed acute Rt much larger than Lt SDH with mass effect and 1.8 cm midline shift with trapping of lateral ventricle.  Pt was decerebrate posturing with non reactive pupils. He underwent craniotomy on 5/24 for evacuation of SDH and was extubated  5/25. pt went to CIR on 5/27.   PMH includes:  thoracic aortic aneurysm; stroke 2015; h/o MVR, CHF, A-Fib with multiple cardioversions.   Assessment / Plan / Recommendation Clinical Impression  Pt's participation in speech-language-cognitive evaluation was limited given level of alertness. Eyes closed and able to demonstrate automatocity during familiar activities such when handed manipulatives. He washed his face, in addition to left side when asked, held cup and initiated bringing to mouth, handed cloth to wife and rubbed gel on lips. He did not verbalize on request but was able to imitate varying intonation during vocalic vocalization. Neglecting left side - prompted wife to stand on right side. ST will continue to evaluate and treat his communicative-cognitive abilities as pt able with improved alertness.    SLP Assessment  SLP Recommendation/Assessment: Patient needs continued Speech Lanaguage Pathology Services SLP Visit Diagnosis:  Dysarthria and anarthria (R47.1);Cognitive communication deficit (R41.841)    Follow Up Recommendations  Inpatient Rehab    Frequency and Duration min 2x/week  2 weeks      SLP Evaluation Cognition  Overall Cognitive Status: Impaired/Different from baseline Arousal/Alertness: Lethargic Orientation Level:  (did not answer yes/no questions) Attention: Sustained Focused Attention: Impaired Focused Attention Impairment: Functional basic;Verbal basic Sustained Attention: Impaired Sustained Attention Impairment: Verbal basic;Functional basic Memory:  (will continue to assess) Awareness: Impaired Awareness Impairment: Intellectual impairment;Emergent impairment;Anticipatory impairment Problem Solving: Impaired Problem Solving Impairment: Functional basic Safety/Judgment: Impaired       Comprehension  Auditory Comprehension Overall Auditory Comprehension:  (followed simple one step automatic with manipulatives, will continue to assess) Yes/No Questions:  (no response today) Commands: Impaired One Step Basic Commands: 25-49% accurate (suspect more due to attention, lethargy) Visual Recognition/Discrimination Discrimination: Not tested Reading Comprehension Reading Status: Not tested    Expression Expression Primary Mode of Expression: Verbal Verbal Expression Initiation: Impaired Repetition:  (imitated varying intonation x 2) Naming: Not tested Pragmatics: Impairment Impairments: Abnormal affect;Eye contact Interfering Components: Attention Written Expression Dominant Hand: Right Written Expression: Not tested  Oral / Motor  Oral Motor/Sensory Function Overall Oral Motor/Sensory Function: Mild impairment Facial ROM: Reduced left;Suspected CN VII (facial) dysfunction Facial Symmetry: Abnormal symmetry left;Suspected CN VII (facial) dysfunction Facial Strength: Reduced left;Suspected CN VII (facial) dysfunction Lingual ROM:  (unable) Motor Speech Overall Motor Speech:  Impaired Phonation: Hoarse;Low vocal intensity Resonance:  (TBA) Articulation:  (TBA) Intelligibility: Not tested Motor Planning:  (TBA)   GO                    [place] Siren 08/13/2020, 12:11 PM  Orbie Pyo Colvin Caroli.Ed Risk analyst [phone] Office [phone]

## 2020-08-13 NOTE — Progress Notes (Signed)
Received from 4 [address] ICU in bed accompanied by nurse and tech and wife.  Oriented to room and surroundings.

## 2020-08-13 NOTE — Progress Notes (Addendum)
Physical Therapy Treatment Patient Details Name: [name] [name] MRN: [id number] DOB: [birth year] Today's Date: 08/13/2020    History of Present Illness 80 y.o. male from [place] on 6/3 acute L sided weakness CT revealed R MCA CVA due to embolism of R middle cerebral artery and R carotid artery. pt revascularized TICI 2 6/3.  Intubated 6/3-6/8. Of note, recently admitted 5/22 CT of head showed acute Rt much larger than Lt SDH with mass effect and 1.8 cm midline shift with trapping of lateral ventricle.  Pt was decerebrate posturing with non reactive pupils. He underwent craniotomy on 5/24 for evacuation of SDH and was extubated  5/25. pt went to CIR on 5/27.   PMH includes:  thoracic aortic aneurysm; stroke 2015; h/o MVR, CHF, A-Fib with multiple cardioversions.    PT Comments    Pt was able to progress to a stand step transfer towards the R this date with maxAx2, initiating a couple small steps with his R foot but not his L. Pt limited this date by his lethargy and inattention to his L side. Attempted to facilitate increased attention to his L through cuing pt to look at his L UE and lower extremity while being cued to move them. Attempted neuro crossover through cuing pt to move bil upper or lower extremities simultaneously, but pt continued to not follow many cued on his L. Will continue to follow acutely. Current recommendations remain appropriate.   Follow Up Recommendations  CIR;Supervision/Assistance - 24 hour     Equipment Recommendations  Rolling walker with 5" wheels;3in1 (PT);Wheelchair (measurements PT);Wheelchair cushion (measurements PT) (may change with progression)    Recommendations for Other Services Rehab consult     Precautions / Restrictions Precautions Precautions: Fall Precaution Comments: cortrak, R mitten Restrictions Weight Bearing Restrictions: No    Mobility  Bed Mobility Overal bed mobility: Needs Assistance Bed Mobility: Supine to Sit     Supine to sit: +2  for physical assistance;+2 for safety/equipment;Total assist;HOB elevated     General bed mobility comments: Pt lethargic, minimally opening eyes and not initiating when supine, thus TAx2 for bed mobility.    Transfers Overall transfer level: Needs assistance Equipment used: 2 person hand held assist Transfers: Sit to/from [facility] Sit to Stand: Max assist;+2 physical assistance;+2 safety/equipment;From elevated surface Stand pivot transfers: Max assist;+2 physical assistance;+2 safety/equipment       General transfer comment: x2 sit <> stand from elevated EOB with bil HHA, achieving full upright standing posture with maxAx2 at buttocks and intermittent bil knee block for safety. MaxAx2 to stand step to R with pt initiating small R foot steps but not L.  Ambulation/Gait Ambulation/Gait assistance: Max assist;+2 physical assistance;+2 safety/equipment Gait Distance (Feet): 1 Feet Assistive device: 2 person hand held assist Gait Pattern/deviations: Step-to pattern;Decreased step length - left;Decreased stride length;Decreased weight shift to right;Decreased weight shift to left;Decreased dorsiflexion - right;Decreased dorsiflexion - left;Trunk flexed Gait velocity: reduced Gait velocity interpretation: <1.31 ft/sec, indicative of household ambulator General Gait Details: Pt taking small staggering steps with R foot but not initiating L to side step to the R for transfer to the recliner with maxAx2 and bil HHA.   Stairs             Wheelchair Mobility    Modified Rankin (Stroke Patients Only) Modified Rankin (Stroke Patients Only) Pre-Morbid Rankin Score: Moderately severe disability Modified Rankin: Severe disability     Balance Overall balance assessment: Needs assistance Sitting-balance support: Feet supported;Single extremity supported;Bilateral upper extremity  supported Sitting balance-Leahy Scale: Poor Sitting balance - Comments: 1-2 UE support  with min-modA support at trunk. Postural control: Left lateral lean;Posterior lean;Other (comment) (eventually anterior lean sitting EOB as pt fatigued) Standing balance support: Bilateral upper extremity supported Standing balance-Leahy Scale: Poor Standing balance comment: MaxAx2 and bil UE support to stand.                            Cognition Arousal/Alertness: Lethargic Behavior During Therapy: Impulsive;Flat affect Overall Cognitive Status: Impaired/Different from baseline Area of Impairment: Following commands;Safety/judgement;Awareness;Problem solving;Attention;Memory                   Current Attention Level: Alternating Memory: Decreased short-term memory Following Commands: Follows one step commands inconsistently;Follows one step commands with increased time Safety/Judgement: Decreased awareness of safety;Decreased awareness of deficits Awareness: Intellectual Problem Solving: Slow processing;Decreased initiation;Difficulty sequencing;Requires verbal cues;Requires tactile cues General Comments: Pt very lethargic, minimally opening eyes briefly with stimulation in supine. Opened eyes and maintained them open when positioned in sitting. Pt needing increased time to process and follow single step multi-modal commands inconsistently, especially with his L side. Poor awareness into his deficits and safety as he tends to not attend to his L side and unable to cross midline with his eyes towards his L. Pt also impulsively reaching towards his cortrak as the session progressed, needing cues to avoid intermittently.      Exercises General Exercises - Lower Extremity Ankle Circles/Pumps: Both;10 reps;Seated Straight Leg Raises: Left;Seated (cued pt to lift L leg in recliner but no success) Other Exercises Other Exercises: cued pt to squeeze bil hands and reach to head or face with bil hands, but pt only moving R side    General Comments General comments (skin  integrity, edema, etc.): VSS on RA      Pertinent Vitals/Pain Pain Assessment: Faces Faces Pain Scale: Hurts a little bit Pain Location: generalized with movement and with cortrak tube Pain Descriptors / Indicators: Grimacing Pain Intervention(s): Limited activity within patient's tolerance;Monitored during session;Repositioned    Home Living                      Prior Function            PT Goals (current goals can now be found in the care plan section) Acute Rehab PT Goals Patient Stated Goal: did not state; wife desires for pt to improve PT Goal Formulation: With patient/family Time For Goal Achievement: 08/26/20 Potential to Achieve Goals: Good Progress towards PT goals: Progressing toward goals    Frequency    Min 4X/week      PT Plan Current plan remains appropriate    Co-evaluation              AM-PAC PT "6 Clicks" Mobility   Outcome Measure  Help needed turning from your back to your side while in a flat bed without using bedrails?: Total Help needed moving from lying on your back to sitting on the side of a flat bed without using bedrails?: Total Help needed moving to and from a bed to a chair (including a wheelchair)?: A Lot Help needed standing up from a chair using your arms (e.g., wheelchair or bedside chair)?: A Lot Help needed to walk in hospital room?: Total Help needed climbing 3-5 steps with a railing? : Total 6 Click Score: 8    End of Session Equipment Utilized During Treatment: Gait belt Activity Tolerance:  Patient tolerated treatment well Patient left: with call bell/phone within reach;in chair;with chair alarm set;with family/visitor present;with restraints reapplied Nurse Communication: Mobility status;Need for lift equipment PT Visit Diagnosis: Unsteadiness on feet (R26.81);Muscle weakness (generalized) (M62.81);History of falling (Z91.81);Difficulty in walking, not elsewhere classified (R26.2);Other symptoms and signs  involving the nervous system (R29.898);Hemiplegia and hemiparesis Hemiplegia - Right/Left: Left Hemiplegia - dominant/non-dominant: Non-dominant Hemiplegia - caused by: Cerebral infarction     Time: 3559-7416 PT Time Calculation (min) (ACUTE ONLY): 32 min  Charges:  $Therapeutic Activity: 8-22 mins $Neuromuscular Re-education: 8-22 mins                     Moishe Spice, PT, DPT Acute Rehabilitation Services  Pager: [phone] Office: [phone]    Orvan Falconer 08/13/2020, 3:35 PM

## 2020-08-13 NOTE — Progress Notes (Addendum)
INTERVAL HISTORY He was successfully extubated yesterday. Neurological exam remains stable. Given Na increase from 153 to 164 yesterday, HTS was stopped. Will continue to monitor Na levels.   Given continued drowsiness on exam today, to help with alertness, Amantadine 100 BID was ordered   Vitals:   08/13/20 0510 08/13/20 0600 08/13/20 0800 08/13/20 1200  BP: 127/71 127/67 137/86 129/67  Pulse: 63 61 61 (!) 57  Resp:  (!) 22 (!) 23 20  Temp:   97.7 F (36.5 C) 98.6 F (37 C)  TempSrc:   Axillary Axillary  SpO2:  95% 94% 94%  Weight:      Height:       CBC:  Recent Labs  Lab 08/08/20 0625 08/09/20 0451  WBC 17.5* 16.4*  NEUTROABS 13.5* 12.6*  HGB 12.3* 11.6*  HCT 36.7* 34.6*  MCV 94.1 93.5  PLT 254 673   Basic Metabolic Panel:  Recent Labs  Lab 08/12/20 2154 08/13/20 0513 08/13/20 1200  NA 152* 150*  --   K 3.3* 5.7*  --   CL 122* 122*  --   CO2 26 26  --   GLUCOSE 174* 175*  --   BUN 33* 35*  --   CREATININE 0.73 0.69  --   CALCIUM 7.8* 7.8*  --   MG 2.3 2.4  --   PHOS 1.9*  --  2.2*   Lipid Panel:  Recent Labs  Lab 08/08/20 0625  CHOL 119  TRIG 105  HDL 42  CHOLHDL 2.8  VLDL 21  LDLCALC 56   HgbA1c:  Recent Labs  Lab 08/08/20 0625  HGBA1C 6.2*   Urine Drug Screen: No results for input(s): LABOPIA, COCAINSCRNUR, LABBENZ, AMPHETMU, THCU, LABBARB in the last 168 hours.  Alcohol Level No results for input(s): ETH in the last 168 hours.  IMAGING past 24 hours No results found.   PHYSICAL EXAM GENERAL: Obese elderly Caucasian male HEENT: Head is atraumatic, normocephalic. Eyes: PERRL.  LUNGS: deep breathing sounds CARDIOVASCULAR: tachycardic  ABDOMEN: Bowel sounds present.  Ext : normal pulse distally  NEUROLOGICAL EXAM MS: Drowsy with eyes closed but partially opens eyes to voice. Follows commands; sticks out tongue, shows two fingers CN 2-12:  R gaze deviation, L facial asymmetry and weakness noted, no facial sensory deficits.  Does not  blink to threat on the left but does on the right. Motor: : L sided hemiparesis with 2/5 strength.  Purposeful antigravity movements on the right side.  No abnormal movements identified.  Sensory:  Unable to assess; seems intact. Toes: equivocal. 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 S/P thrombectomy with TICI2C. Pt was deemed not a candidate for TPA being outside the TPA window. Vascular risk factors: HTN, DM, afib HLD  CT head  hypodensity right MCA vessel sign, aspects 5, R SDH unchanged, decreased interhemispheric [facility] SAH. Stable extraaxial hemorrhage over the L parietal lobe measuring 10 mm. 4 mm left midline shift CTA head & neck RICA occlusion shortely beyond origin with R M2 occlusion. Moderate L vert stenosis. ascending thoracic aorta measures up to 4 cm in diameter, severe right PCA P3/P4 branch stenosis.  CT perfusion 45 mL core  infarct within the right MCA vascular territory 162 mL region of hypoperfusion predominantly within the right MCA vascular territory.Reported mismatch volume: 117 mL.  Repeated CT head today showed Evolved relatively large right MCA infarcts since 08/07/2020 with increased intracranial mass effect and increased leftward midline shift now 9 mm.  2D Echo lvef 60-65% SEVERELY dilated LA LDL 56 HgbA1c 6.2  Lovenox for VTE prophylaxis Diet Order             Diet NPO time specified  Diet effective now                 Amantadine 100 BID initiated 08/13/20 to help with alertness  No antiplatelet prior to hospitalization; currently on ASA 325 mg per tube  Therapy recommendations:  Pending Disposition:   Pending  Hypertension Maintain SBP <160 for now.  For HTN continue Losartan 25 mg QD, Metoprolol 12.5 Q6,  Long-term BP goal <140/90  Hyperlipidemia Home meds:  Lipitor 80 mg qhs continued this hospitalization  LDL 56, goal < 70 Continue statin at discharge  Diabetes type II  HgbA1c 6.2, goal < 7.0 Maintain BG 80-180 CBGs Recent Labs    08/13/20 0401 08/13/20 0907 08/13/20 1215  GLUCAP 146* 158* 158*    SSI   Dysarthria, aphasia, dysphasia- d/t stroke Dysphagia following cerebral infarction  -NPO until cleared by speech once extubated -ST following -Advance diet as tolerated -May need PEG; will place Cortrak now for nutriton as currently OG is being used for meds only.   Hemiplegia and hemiparesis following cerebral infarction affecting left non-dominant side  -PT/OT -PM&R consult   RESP Acute Respiratory Failure  -Appreciate vent management per ICU -wean when able  Paroxysmal atrial fibrillation -Rate control, continue metoprolol and home dofetilide -Hold anticoagulation for now pending clinical course. On ASA    Thoracic aortic aneurysm -Known problem is being followed in [place], recent imaging in May -Follow-up outside records   HEME No active anemia -Monitor -transfuse for hgb < 7   GI/GU BPH -Gentle hydration -avoid nephrotoxic agents -Continue home medications   Fluid/Electrolyte Disorders -Monitor -Replete as needed   ID Possible PNA, WBC 17.5 >16.4, CXR with L basilar consolidation  -On Unasyn  IV -NPO -Monitor   OTHER ISSUES:  Cerebral edema:  Have been obtaining serial CTH to monitor cerebral edema. Most recent on 08/12/20 was stable. HTS was stopped on 08/12/20. Are continuing to monitor Na levels  Ruta Hinds, NP  [facility] Nurse Practitioner  Patient seen and discussed with attending physician Dr. [name]     .  Patient was extubated yesterday and seems to be breathing all right.  He remains drowsy but arousable  with left hemiparesis.  His blood pressure is still suboptimally controlled.  Recommend change coverage to labetalol and continue to use as needed IV hydralazine to achieve blood pressure goal.  Start amantadine 100 twice daily to increase wakefulness and participation in therapy.  Long discussion with his wife at the bedside and answered questions.  Discussed with Dr. Tacy Learn critical care medicine. This patient is critically ill and at significant risk of neurological worsening, death and care requires constant monitoring of vital signs, hemodynamics,respiratory and cardiac monitoring, extensive review of multiple databases, frequent neurological assessment, discussion with family, other specialists and medical decision making of high complexity.I have made any additions or clarifications directly to the above note.This critical care time does not reflect procedure time, or teaching time or supervisory time of PA/NP/Med Resident etc but could involve care discussion  time.  I spent 30 minutes of neurocritical care time  in the care of  this patient.     Antony Contras, MD Medical Director [facility] Pager: [phone] 08/13/2020 2:15 PM  To contact Stroke Continuity provider, please refer to [email or web address] After hours, contact General Neurology

## 2020-08-14 LAB — BASIC METABOLIC PANEL
Anion gap: 6 (ref 5–15)
BUN: 37 mg/dL — ABNORMAL HIGH (ref 8–23)
CO2: 26 mmol/L (ref 22–32)
Calcium: 8.1 mg/dL — ABNORMAL LOW (ref 8.9–10.3)
Chloride: 118 mmol/L — ABNORMAL HIGH (ref 98–111)
Creatinine, Ser: 0.69 mg/dL (ref 0.61–1.24)
GFR, Estimated: >60 mL/min (ref >60)
Glucose, Bld: 173 mg/dL — ABNORMAL HIGH (ref 70–99)
Potassium: 3.4 mmol/L — ABNORMAL LOW (ref 3.5–5.1)
Sodium: 150 mmol/L — ABNORMAL HIGH (ref 135–145)

## 2020-08-14 LAB — GLUCOSE, CAPILLARY
Glucose-Capillary: 112 mg/dL — ABNORMAL HIGH (ref 70–99)
Glucose-Capillary: 141 mg/dL — ABNORMAL HIGH (ref 70–99)
Glucose-Capillary: 143 mg/dL — ABNORMAL HIGH (ref 70–99)
Glucose-Capillary: 148 mg/dL — ABNORMAL HIGH (ref 70–99)
Glucose-Capillary: 151 mg/dL — ABNORMAL HIGH (ref 70–99)
Glucose-Capillary: 170 mg/dL — ABNORMAL HIGH (ref 70–99)
Glucose-Capillary: 176 mg/dL — ABNORMAL HIGH (ref 70–99)

## 2020-08-14 MED ORDER — POTASSIUM CHLORIDE 20 MEQ PO PACK
40.0000 meq | PACK | Freq: Once | ORAL | Status: AC
  Administered 2020-08-14: 40 meq
  Filled 2020-08-14: qty 2

## 2020-08-14 MED ORDER — FREE WATER
175.0000 mL | Status: DC
  Administered 2020-08-14 – 2020-08-15 (×7): 175 mL

## 2020-08-14 MED ORDER — POTASSIUM CHLORIDE 20 MEQ PO PACK: 20.0000 meq | PACK | Freq: Once | ORAL | Status: DC

## 2020-08-14 MED ORDER — POTASSIUM CHLORIDE 20 MEQ PO PACK: 40.0000 meq | PACK | Freq: Once | ORAL | Status: DC

## 2020-08-14 NOTE — Progress Notes (Signed)
PROGRESS NOTE  MUKUND WEINREB ZOX:[id number] DOB: March 15, 1940 DOA: 08/07/2020 PCP: [name], MD   LOS: 7 days   Brief narrative:  [name] [name] is a 80 y.o. male with past medical history significant for atrial fibrillation on warfarin, history of aortic valve replacement, mitral valve replacement, acute rheumatic pericarditis (at age 29/7), stroke (2015, secondary to A. fib no residual) presented to hospital after he slumped over.  He suddenly became pale and lost consciousness and fell backwards hitting his head.  Patient [facility] with code stroke on 5/23 and was found to have large subdural hematoma.  He was also noted to have weakness of his left side and a CT scan showed hypodensity in the MCA area.  There was right-sided carotid occlusion.  Patient underwent revascularization procedure.  Patient was in the ICU for the same reason and subsequently was transferred out of the ICU.   Assessment/Plan:  Active Problems:   Hyperlipidemia with target LDL less than 130   Essential hypertension   Chronic a-fib (HCC)   Subdural hematoma (HCC)   CVA (cerebral vascular accident) ([place])   Middle cerebral artery embolism, right   Cerebral infarction due to embolism of right middle cerebral artery and R carotid artery Occlusion.  Likely secondary to atrial fibrillation.  Patient had subdural hematoma so anticoagulation was not given which could have predisposed to atrial fibrillation.  Patient underwent thrombectomy for the same..  Latest 2D echocardiogram with LV ejection fraction of 60 to 65%, LDL of 56, hemoglobin A1c of 6.2.  Continue full dose aspirin for now.  Continue Lipitor.  Dysphagia, aphasia, dysarthria due to stroke. Patient is currently on a cortrak tube tube.  Might need PEG tube placement.   Acute Respiratory Failure Patient was a intubated and recently extubated.  Continue supportive care.   Essential (primary) hypertension Plan is to keep the blood pressure less than  140 long-term..  Continue losartan and metoprolol.   Heart failure, unspecified Compensated at this time   Hyperlipidemia, unspecified - Statin for goal LDL < 70.  Continue Lipitor 80 mg daily.   Paroxysmal atrial fibrillation Continue metoprolol and dofetilide.    Thoracic aortic aneurysm Patient had a recent CT scan done in May.  [facility] at [facility].  Type 2 diabetes mellitus continue sliding-scale insulin, Accu-Cheks, diabetic diet.  Goal A1c of less than 7.  BPH Continue doxazosin.   DVT prophylaxis: enoxaparin (LOVENOX) injection 40 mg Start: 08/09/20 1245 SCDs Start: 08/09/20 1233 SCD's Start: 08/07/20 1242    Code Status: Full code  Family Communication: None  Status is: Inpatient  Remains inpatient appropriate because:Unsafe d/c plan, IV treatments appropriate due to intensity of illness or inability to take PO, Inpatient level of care appropriate due to severity of illness, and need for rehabilitation.  Dispo: The patient is from: Home              Anticipated d/c is to: CIR              Patient currently is not medically stable to d/c.   Difficult to place patient No   Consultants: Neurology PCCM   Procedures: Status post thrombectomy.  Anti-infectives:  None at present  Anti-infectives (From admission, onward)    Start     Dose/Rate Route Frequency Ordered Stop   08/07/20 1600  Ampicillin-Sulbactam (UNASYN) 3 g in sodium chloride 0.9 % 100 mL IVPB        3 g 200 mL/hr over 30 Minutes Intravenous  Every 6 hours 08/07/20 1422 08/10/20 1015        Subjective: Today, patient was seen and examined at bedside.  Nonverbal, grunting and moaning only.  Objective: Vitals:   08/14/20 0438 08/14/20 0721  BP: (!) 129/97 (!) 145/76  Pulse: 66 60  Resp: 18 18  Temp: 98.6 F (37 C) 97.7 F (36.5 C)  SpO2: 97% 95%    Intake/Output Summary (Last 24 hours) at 08/14/2020 1030 Last data filed at 08/14/2020 0600 Gross per 24 hour  Intake 615 ml   Output 800 ml  Net -185 ml   Filed Weights   08/09/20 0400 08/13/20 0500 08/14/20 0434  Weight: 89.6 kg 90.3 kg 91.8 kg   Body mass index is 31.7 kg/m.   Physical Exam: GENERAL: Patient is alert awake, nonverbal, grunting and moaning, not in obvious distress. HENT: No scleral pallor or icterus. Pupils equally reactive to light. Oral mucosa is moist, cortrak tube tube in place. NECK: is supple, no gross swelling noted. CHEST: Clear to auscultation. No crackles or wheezes.  Diminished breath sounds bilaterally. CVS: S1 and S2 heard, no murmur. Regular rate and rhythm.  ABDOMEN: Soft, non-tender, bowel sounds are present. EXTREMITIES: Moves all left-sided extremities  CNS: Moving left extremities. SKIN: warm and dry without rashes.  Data Review: I have personally reviewed the following laboratory data and studies,  CBC: Recent Labs  Lab 08/07/20 1337 08/08/20 0625 08/09/20 0451  WBC  --  17.5* 16.4*  NEUTROABS  --  13.5* 12.6*  HGB 12.2* 12.3* 11.6*  HCT 36.0* 36.7* 34.6*  MCV  --  94.1 93.5  PLT  --  254 244   Basic Metabolic Panel: Recent Labs  Lab 08/08/20 1705 08/09/20 0451 08/09/20 1039 08/09/20 1611 08/09/20 2158 08/10/20 0553 08/10/20 0905 08/12/20 0930 08/12/20 1457 08/12/20 2154 08/13/20 0513 08/13/20 1200 08/14/20 0414  NA  --  135   < > 140   < > 142   < > 164* 156* 152* 150*  --  150*  K  --  3.7  --   --   --  3.9  --   --   --  3.3* 5.7*  --  3.4*  CL  --  106  --   --   --  115*  --   --   --  122* 122*  --  118*  CO2  --  24  --   --   --  23  --   --   --  26 26  --  26  GLUCOSE  --  144*  --   --   --  166*  --   --   --  174* 175*  --  173*  BUN  --  20  --   --   --  21  --   --   --  33* 35*  --  37*  CREATININE  --  0.81  --   --   --  0.69  --   --   --  0.73 0.69  --  0.69  CALCIUM  --  8.1*  --   --   --  8.0*  --   --   --  7.8* 7.8*  --  8.1*  MG 1.9 2.1  --  2.0  --  2.2  --   --   --  2.3 2.4  --   --   PHOS 3.2 2.4*  --  2.7   --   --   --   --   --  1.9*  --  2.2*  --    < > = values in this interval not displayed.   Liver Function Tests: No results for input(s): AST, ALT, ALKPHOS, BILITOT, PROT, ALBUMIN in the last 168 hours. No results for input(s): LIPASE, AMYLASE in the last 168 hours. No results for input(s): AMMONIA in the last 168 hours. Cardiac Enzymes: No results for input(s): CKTOTAL, CKMB, CKMBINDEX, TROPONINI in the last 168 hours. BNP (last 3 results) No results for input(s): BNP in the last 8760 hours.  ProBNP (last 3 results) No results for input(s): PROBNP in the last 8760 hours.  CBG: Recent Labs  Lab 08/13/20 1638 08/13/20 2027 08/14/20 0054 08/14/20 0433 08/14/20 0807  GLUCAP 164* 141* 176* 170* 143*   Recent Results (from the past 240 hour(s))  Culture, Respiratory w Gram Stain     Status: None   Collection Time: 08/07/20  2:00 PM   Specimen: Tracheal Aspirate; Respiratory  Result Value Ref Range Status   Specimen Description TRACHEAL ASPIRATE  Final   Special Requests NONE  Final   Gram Stain   Final    FEW WBC PRESENT,BOTH PMN AND MONONUCLEAR NO ORGANISMS SEEN    Culture   Final    RARE Normal respiratory flora-no Staph aureus or Pseudomonas seen Performed at [facility], [address] [place], [place] [address]    Report Status 08/10/2020 FINAL  Final     Studies: No results found.    Flora Lipps, MD  [facility] 08/14/2020  If 7PM-7AM, please contact night-coverage

## 2020-08-14 NOTE — Progress Notes (Signed)
  Speech Language Pathology Treatment: Dysphagia;Cognitive-Linquistic  Patient Details Name: [name] [name] MRN: [id number] DOB: 1940-03-29 Today's Date: 08/14/2020 Time: 1025-1100 SLP Time Calculation (min) (ACUTE ONLY): 35 min  Assessment / Plan / Recommendation Clinical Impression  Pt seen with wife at bedside, though lethargic, pt was able to open eyes follow verbal only commands, brush his own teeth with assist. Pt did not attempt to phonate, did not make communicative attempt except for one slight head nod after tactile cues to do so. Pt is able to take sips of water with hand over hand assist. There is anterior spillage and some oral pocketing. Pt does initiate a swallow and prognosis for swallowing is fair to good though arousal will need to improve. Education provided to wife.  HPI HPI: 80 y.o. 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 pt revascularized TICI 2 6/3.  Intubated 6/3-6/8. Of note, recently admitted 5/22 CT of head showed acute Rt much larger than Lt SDH with mass effect and 1.8 cm midline shift with trapping of lateral ventricle.  Pt was decerebrate posturing with non reactive pupils. He underwent craniotomy on 5/24 for evacuation of SDH and was extubated  5/25. pt went to CIR on 5/27.   PMH includes:  thoracic aortic aneurysm; stroke 2015; h/o MVR, CHF, A-Fib with multiple cardioversions.      SLP Plan  Continue with current plan of care       Recommendations  Diet recommendations: NPO Medication Administration: Via alternative means                General recommendations: Rehab consult Oral Care Recommendations: Oral care QID Follow up Recommendations: Inpatient Rehab SLP Visit Diagnosis: Dysarthria and anarthria (R47.1);Cognitive communication deficit [phone]) Plan: Continue with current plan of care       GO                Kori Goins, Katherene Ponto 08/14/2020,  1:25 PM

## 2020-08-14 NOTE — Progress Notes (Addendum)
INTERVAL HISTORY Drowsy but subtly more alert on todays examination- Amantadine initiated yesterday.  Is participating well with therapy and inpatient rehab is interested and may have a bed for him tomorrow.  Speech therapy has seen him and recommended n.p.o. continue tube feeding   Vitals:   08/14/20 0434 08/14/20 0438 08/14/20 0721 08/14/20 1145  BP:  (!) 129/97 (!) 145/76 (!) (P) 143/76  Pulse:  66 60 (P) 60  Resp:  18 18 (P) 18  Temp:  98.6 F (37 C) 97.7 F (36.5 C) (P) 98.3 F (36.8 C)  TempSrc:  Oral Oral (P) Oral  SpO2:  97% 95% (P) 96%  Weight: 91.8 kg     Height:       CBC:  Recent Labs  Lab 08/08/20 0625 08/09/20 0451  WBC 17.5* 16.4*  NEUTROABS 13.5* 12.6*  HGB 12.3* 11.6*  HCT 36.7* 34.6*  MCV 94.1 93.5  PLT 254 979   Basic Metabolic Panel:  Recent Labs  Lab 08/12/20 2154 08/13/20 0513 08/13/20 1200 08/14/20 0414  NA 152* 150*  --  150*  K 3.3* 5.7*  --  3.4*  CL 122* 122*  --  118*  CO2 26 26  --  26  GLUCOSE 174* 175*  --  173*  BUN 33* 35*  --  37*  CREATININE 0.73 0.69  --  0.69  CALCIUM 7.8* 7.8*  --  8.1*  MG 2.3 2.4  --   --   PHOS 1.9*  --  2.2*  --    Lipid Panel:  Recent Labs  Lab 08/08/20 0625  CHOL 119  TRIG 105  HDL 42  CHOLHDL 2.8  VLDL 21  LDLCALC 56   HgbA1c:  Recent Labs  Lab 08/08/20 0625  HGBA1C 6.2*   Urine Drug Screen: No results for input(s): LABOPIA, COCAINSCRNUR, LABBENZ, AMPHETMU, THCU, LABBARB in the last 168 hours.  Alcohol Level No results for input(s): ETH in the last 168 hours.  IMAGING past 24 hours No results found.   PHYSICAL EXAM GENERAL: Obese elderly Caucasian male HEENT: Head is atraumatic, normocephalic. Eyes: PERRL.  LUNGS: deep breathing sounds CARDIOVASCULAR: tachycardic  ABDOMEN: Bowel sounds present.  Ext : normal pulse distally  NEUROLOGICAL EXAM MS: Drowsy with eyes closed but partially opens eyes to voice. Follows commands; sticks out tongue, shows two fingers CN 2-12:  R  gaze deviation, L facial asymmetry and weakness noted, no facial sensory deficits.  Does not blink to threat on the left but does on the right. Motor: : L sided hemiparesis with 2/5 strength.  Purposeful antigravity movements on the right side.  No abnormal movements identified.  Sensory:  Unable to assess; seems intact. Toes: equivocal. 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 S/P thrombectomy with TICI2C. Pt was deemed not a candidate for TPA being outside the TPA window. Vascular risk factors: HTN, DM, afib HLD  CT head  hypodensity right MCA vessel sign, aspects 5, R SDH unchanged, decreased interhemispheric [facility] SAH. Stable extraaxial hemorrhage over the L parietal lobe measuring 10 mm. 4 mm left midline shift CTA head & neck RICA occlusion shortely beyond origin with R M2 occlusion. Moderate  L vert stenosis. ascending thoracic aorta measures up to 4 cm in diameter, severe right PCA P3/P4 branch stenosis.  CT perfusion 45 mL core infarct within the right MCA vascular territory 162 mL region of hypoperfusion predominantly within the right MCA vascular territory.Reported mismatch volume: 117 mL.  Repeated CT head today showed Evolved relatively large right MCA infarcts since 08/07/2020 with increased intracranial mass effect and increased leftward midline shift now 9 mm.  2D Echo lvef 60-65% SEVERELY dilated LA LDL 56 HgbA1c 6.2  Lovenox for VTE prophylaxis Diet Order             Diet NPO time specified  Diet effective now                 Amantadine 100 BID initiated 08/13/20 to help with alertness  No antiplatelet prior to hospitalization; currently on  ASA 325 mg per tube  Therapy recommendations:  CIR  Disposition:  Pending  Hypertension Maintain SBP <160 for now.  For HTN continue Losartan 25 mg QD and Metoprolol 12.5 Q6  Long-term BP goal <140/90  Hyperlipidemia Home meds:  Lipitor 80 mg qhs continued this hospitalization  LDL 56, goal < 70 Continue statin at discharge  Diabetes type II  HgbA1c 6.2, goal < 7.0 Maintain BG 80-180 CBGs Recent Labs    08/14/20 0433 08/14/20 0807 08/14/20 1144  GLUCAP 170* 143* 148*    SSI   Dysarthria, aphasia, dysphasia- d/t stroke Dysphagia following cerebral infarction, ST recommending NPO as of 6/9 will likely need a peg tube. Currently has Cortrak. ST to continue to follow.    Hemiplegia and hemiparesis following cerebral infarction affecting left non-dominant side  -PT/OT -PM&R consult   RESP Acute Respiratory Failure  -Appreciate vent management per ICU -wean when able  Paroxysmal atrial fibrillation -Rate control, continue metoprolol and home dofetilide -Hold anticoagulation for now pending clinical course. On ASA. Will consider resuming AC 10 days out from stroke.    Thoracic aortic aneurysm -Known problem is being followed in [place], recent imaging in May -Follow-up outside records   HEME No active anemia -Monitor -transfuse for hgb < 7   GI/GU BPH -Gentle hydration -avoid nephrotoxic agents -Continue home medications   Fluid/Electrolyte Disorders -Monitor -Replete as needed   ID #Leukocytosis  #Pneumonia  Possible PNA, WBC 17.5 >16.4, CXR with L basilar consolidation. Was treated with IV Unasyn empirically.  -FFWU if he fevers    OTHER ISSUES:  Cerebral edema:  Have been obtaining serial CTH to monitor cerebral edema. Most recent on 08/12/20 was stable. HTS was stopped on 08/12/20. Are continuing to monitor Na levels- stable at 150 today.   Ruta Hinds, NP  [facility] Nurse Practitioner  Patient seen and discussed with attending  physician Dr. [name]    Continue tube feeding with core track for now.  Continue ongoing therapy.  Transfer to inpatient rehab over the next few days when bed available.  Discussed with Dr. Louanne Belton.  Stroke team will sign off.  Kindly call for questions.  Greater than 50% time during this 25-minute visit were spent in counseling and coordination of care and discussion with care team.    Antony Contras, MD Medical Director [place] Pager: [phone] 08/14/2020 12:28 PM  To contact Stroke Continuity provider, please refer to [email or web address] After hours, contact General Neurology

## 2020-08-14 NOTE — Progress Notes (Signed)
Nutrition Follow-up  DOCUMENTATION CODES:   Not applicable  INTERVENTION:  Continue TF via Cortrak: Osmolite 1.5 at 55 ml/h (1320 ml per day) Prosource TF 45 ml TID 138ml free water Q4H (may need to be increased given hypernatremia; to be adjusted per MD recommendations)   Provides 2100 kcal, 115 gm protein, 1005 ml free water daily (2098ml total free water with flushes)  NUTRITION DIAGNOSIS:   Inadequate oral intake related to inability to eat as evidenced by NPO status.  ongoing  GOAL:   Patient will meet greater than or equal to 90% of their needs  Met with TF  MONITOR:   TF tolerance, Diet advancement  REASON FOR ASSESSMENT:   Consult, Ventilator Enteral/tube feeding initiation and management  ASSESSMENT:   Pt with PMH of Afib on coumadin, thoracic aortic aneurysm, rheumatic pericarditis s/p AVR/MVR and CHF admitted 5/23 with SDH after a fall.  5/23 s/p craniotomy/hematoma evacuation 5/27 d/c to CIR 6/3 pt somnolent and found to have large R MCA stroke off coumadin; taken emergently for thrombectomy with TICI 2 revascularization  6/5 increased somnolence, CT Head demonstrating 77mm midline shift and increased IC mass effect/edema, hypertonic 3% started; tolerated short PSV/CPAP wean 6/6 cortrak placed (tip gastric per xray); CT Head w/ decreased mass effect, right MCA infarct again seen 6/8 extubated; CT with evolving large R MCA infarct, similar mass effect, no new hemorrhage; remains on hypertonic 3% 6/9 stable overnight on 3%, Na 150, K elevated at 5.7, plan to wean, pt tx to floor  Pt sleeping at time of RD visit. Pt remains on TF via Cortrak, current regimen as follows: Osmolite 1.5 @ 64ml/hr w/ Prosource TF 47ml TID. Tolerating TF per RN. Recommend free water flushes given hypernatremia.   UOP: 838ml x24 hours Stool: 4x unmeasured occurrences x24 hours I/O: +3379ml snice admit  Medications:  amantadine  100 mg Per Tube BID   aspirin  325 mg Per Tube  Daily   atorvastatin  80 mg Per Tube QPM   chlorhexidine  15 mL Mouth Rinse BID   chlorhexidine gluconate (MEDLINE KIT)  15 mL Mouth Rinse BID   Chlorhexidine Gluconate Cloth  6 each Topical Daily   dofetilide  500 mcg Per Tube BID   doxazosin  2 mg Per Tube Daily   enoxaparin (LOVENOX) injection  40 mg Subcutaneous Daily   feeding supplement (PROSource TF)  45 mL Per Tube TID   free water  175 mL Per Tube Q4H   insulin aspart  0-9 Units Subcutaneous Q4H   losartan  25 mg Per Tube Daily   mouth rinse  15 mL Mouth Rinse q12n4p   metoprolol tartrate  12.5 mg Per Tube Q6H   pantoprazole sodium  40 mg Per Tube Daily   polyethylene glycol  17 g Per Tube Daily   potassium chloride  40 mEq Per Tube Once   senna-docusate  1 tablet Per Tube BID   sodium chloride flush  10-40 mL Intracatheter Q12H   sodium phosphate  1 enema Rectal Once   Labs: Recent Labs  Lab 08/09/20 1611 08/09/20 2158 08/10/20 0553 08/10/20 0905 08/12/20 2154 08/13/20 0513 08/13/20 1200 08/14/20 0414  NA 140   < > 142   < > 152* 150*  --  150*  K  --   --  3.9  --  3.3* 5.7*  --  3.4*  CL  --   --  115*  --  122* 122*  --  118*  CO2  --   --  23  --  26 26  --  26  BUN  --   --  21  --  33* 35*  --  37*  CREATININE  --   --  0.69  --  0.73 0.69  --  0.69  CALCIUM  --   --  8.0*  --  7.8* 7.8*  --  8.1*  MG 2.0  --  2.2  --  2.3 2.4  --   --   PHOS 2.7  --   --   --  1.9*  --  2.2*  --   GLUCOSE  --   --  166*  --  174* 175*  --  173*   < > = values in this interval not displayed.  CBGs [phone]  Diet Order:   Diet Order             Diet NPO time specified  Diet effective now                   EDUCATION NEEDS:   No education needs have been identified at this time  Skin:  Skin Assessment: Reviewed RN Assessment  Last BM:  6/10 type 7  Height:   Ht Readings from Last 1 Encounters:  08/07/20 5' 7" (1.702 m)    Weight:   Wt Readings from Last 1 Encounters:  08/14/20 91.8 kg   BMI:   Body mass index is 31.7 kg/m.  Estimated Nutritional Needs:   Kcal:  2000-2200  Protein:  115-130 grams  Fluid:  > 2 L/day    [name] Ina, MS, RD, LDN RD pager number and weekend/on-call pager number located in [place].

## 2020-08-14 NOTE — H&P (Signed)
Physical Medicine and Rehabilitation Admission H&P    CC: Stroke with functional deficits.    HPI: [name] [name] is a 80 year old male with history of diastolic CHF, AVR/MVR, A fib/A flutter -on coumadin/Tiksoyn who was admitted on 07/27/20 with difficulty speaking and right sided weakness after sustaining a fall at church the day before. 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 He was intubated for airway protection and family elected on DNR with comfort care due to poor prognosis.  However, patient started showing ability to follow commands on 05/24 with repeat CT showing persistent bilateral SDH.  He was taken to the OR for right craniotomy for evacuation of SDH by Dr. Ronnald Ramp.  He tolerated extubation without difficulty but was limited by left greater than right-sided weakness with visual deficits, strong posterior bias as well as poor safety awareness with cognitive deficits.  He was admitted to rehab on 05/27 for intensive rehab program to consist of PT OT and speech therapy.  On 06/03 am, he was found to have decreased in [place] with left sided weakness and right inattention.  Code stroke activated and  CTA showed occluded R-ICA with exception of distal terminus and occlusion of proximal right M2 MCAts and perfusion study showed core infarct with hypoperfusion within R-MCA territory. with He underwent cerebral angio with revascularization of occluded R-ICA terminus. MRI brain done revealing confluent acute infarct throughout majority of R-MCA territory with some sparing of anterior insula and frontal operculum, spotty involvement right caudate improvement, petechial blood products and mass-effect with right-to-left 7.5 mm shift residual subdural blood and fluid on right as well as left parieto-occipital region.  Mentation was slowly improving with repeat CT showing 9 mm midline shift.    He was started on hypertonic saline for management of edema and core track  placed for nutritional support.  Pharmacy consulted to assist with monitoring electrolytes secondary to Tikosyn on board.  He tolerated extubation on 06/08 and mentation continue to fluctuate with bouts of lethargy.  He was kept n.p.o. and amantadine was added to help with activation.  Dr. 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 fib with interruption of AC due to recent SAH/SDH.  ASA added with recommendations to consider DOAC in 10 days.  Therapy has been ongoing and patient limited by lethargy, does not attempt to phonate or communicate, required hand overhand assist to take sips of water, has delayed in processing, left inattention with impulsivity and poor safety awareness.  CIR was recommended due to functional deficits.   Per pt's wife- pt is waking up a little in late afternoon/evening- Neuro thinks his days/nights mixed up.  On Tfs 55cc/hour continuous Wife took off mittens, but requiring at all times, because pulling cortrak.    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    Other and unspecified hyperlipidemia    Pre-diabetes    Previous back surgery 1978, jan 2007   Psychosexual dysfunction with inhibited sexual excitement    SOB (shortness of breath)    "with heavy exercise"   Stroke ([place]) 08/2013   "I WAS IN AFIB AND THREW A CLOT, THE EFFECTS WERE TRANSITORY AND DIDNT LAST BUT FOR 30 MINUTES"    Thoracic aortic aneurysm [facility])      Past Surgical History:  Procedure Laterality Date   AORTIC AND MITRAL VALVE REPLACEMENT     09/2004   CARDIOVERSION     3 times from 2004-2006   CARDIOVERSION N/A 09/26/2013   Procedure: CARDIOVERSION;  Surgeon: Larey Dresser, MD;  Location: [facility];  Service: Cardiovascular;  Laterality: N/A;   CARDIOVERSION N/A 06/19/2014   Procedure: CARDIOVERSION;  Surgeon: Jerline Pain, MD;  Location: [place];  Service: Cardiovascular;  Laterality: N/A;   CARDIOVERSION N/A 04/24/2017   Procedure: CARDIOVERSION;  Surgeon: Larey Dresser, MD;  Location: [place];  Service: Cardiovascular;  Laterality: N/A;   CARDIOVERSION N/A 06/08/2017   Procedure: CARDIOVERSION;  Surgeon: Pixie Casino, MD;  Location: [facility] ENDOSCOPY;  Service: Cardiovascular;  Laterality: N/A;   CARDIOVERSION N/A 08/24/2017   Procedure: CARDIOVERSION;  Surgeon: Skeet Latch, MD;  Location: [facility] ENDOSCOPY;  Service: Cardiovascular;  Laterality: N/A;   CARDIOVERSION N/A 02/14/2018   Procedure: CARDIOVERSION;  Surgeon: Larey Dresser, MD;  Location: [facility] ENDOSCOPY;  Service: Cardiovascular;  Laterality: N/A;   COLONOSCOPY     CRANIOTOMY N/A 07/28/2020   Procedure: CRANIOTOMY FOR EVACUATION OF SUBDURAL HEMATOMA;  Surgeon: Eustace Hehir, MD;  Location: [place];  Service: Neurosurgery;  Laterality: N/A;   IR ANGIO VERTEBRAL SEL SUBCLAVIAN INNOMINATE UNI R MOD SED  08/07/2020   IR CT HEAD LTD  08/07/2020   IR PERCUTANEOUS ART THROMBECTOMY/INFUSION INTRACRANIAL INC DIAG ANGIO  08/07/2020   laminectomies     10/1975 and in 03/2005   RADIOLOGY WITH ANESTHESIA N/A 08/07/2020   Procedure: IR WITH ANESTHESIA;  Surgeon: Luanne Bras, MD;  Location: [place];  Service: Radiology;  Laterality: N/A;   TONSILLECTOMY     1950   TOTAL HIP ARTHROPLASTY Right 04/03/2018   Procedure: TOTAL HIP ARTHROPLASTY ANTERIOR APPROACH;  Surgeon: Paralee Cancel, MD;  Location: [facility];  Service: Orthopedics;  Laterality: Right;  70 mins    Family  History  Problem Relation Age of Onset   Macular degeneration Mother        macular degeneration   Cancer Father        intestinal/GI   Diabetes Paternal Aunt    Heart attack Maternal Grandfather    Diabetes Brother     Social History:  reports that he has never smoked. He has never used smokeless tobacco. He reports that he does not drink alcohol and does not use drugs.   Allergies  Allergen Reactions   Naproxen Hives   No [employer] Food Allergies Other (See Comments)    Scallops - distress, nausea and vomitting    Medications Prior to Admission  Medication Sig Dispense Refill   atorvastatin (LIPITOR) 80 MG tablet TAKE 1 TABLET (80 MG TOTAL) BY MOUTH EVERY EVENING. (Patient taking differently: Take 80 mg by mouth daily.) 90 tablet 3   clobetasol cream (TEMOVATE) 5.17 % Apply 1 application topically daily as needed (for skin irritation).      Coenzyme Q10 (CO Q-10) 300 MG CAPS Take 300  mg by mouth daily.     Cyanocobalamin (VITAMIN B-12) 5000 MCG SUBL Place 5,000 mcg under the tongue daily at 2 PM.      docusate sodium (COLACE) 100 MG capsule Take 100 mg by mouth daily.     dofetilide (TIKOSYN) 500 MCG capsule Take 1 capsule (500 mcg total) by mouth 2 (two) times daily. 180 capsule 3   finasteride (PROSCAR) 5 MG tablet Take 5 mg by mouth daily.     furosemide (LASIX) 20 MG tablet Take 1 tablet (20 mg total) by mouth daily. 90 tablet 3   Glucosamine-Chondroitin (COSAMIN DS PO) Take 1 tablet by mouth 2 (two) times daily.     loratadine (CLARITIN) 10 MG tablet Take 10 mg by mouth daily as needed for allergies.      losartan (COZAAR) 25 MG tablet Take 1 tablet (25 mg total) by mouth daily. 90 tablet 3   metoprolol succinate (TOPROL-XL) 50 MG 24 hr tablet Take 75 mg (1.5 tabs) by mouth twice daily. Take with or immediately following a meal. (Patient taking differently: Take 75 mg by mouth in the morning and at bedtime.) 90 tablet 3   Multiple Vitamins-Minerals (MULTIVITAL-M) TABS Take  1 tablet by mouth daily.      potassium chloride SA (KLOR-CON) 20 MEQ tablet Take 1 tablet (20 mEq total) by mouth daily. 90 tablet 3   Probiotic Product (PROBIOTIC FORMULA PO) Take 1 capsule by mouth daily.     tamsulosin (FLOMAX) 0.4 MG CAPS capsule Take 0.4 mg by mouth 2 (two) times daily.     warfarin (COUMADIN) 5 MG tablet As directed (Patient taking differently: Take 5 mg by mouth See admin instructions. 1 tablet everyday with an addition of 1 1/2 tablet on MWF and Saturday.) 120 tablet 1   COVID-19 mRNA vaccine, Moderna, 100 MCG/0.5ML injection Inject into the muscle. 0.25 mL 0    Drug Regimen Review  Drug regimen was reviewed and remains appropriate with no significant issues identified  Home: Home Living Family/patient expects to be discharged to::  ([facility] living [facility]) Living Arrangements: Spouse/significant other Available Help at Discharge: Family, Available 24 hours/day Type of Home: House Home Access: Level entry Home Layout: One level Bathroom Shower/Tub: Multimedia programmer: Standard Additional Comments: Pt has a shower bench built-in to the side of the handheld shower head. x1 vertical grab bar at site of handheld shower head.  Lives With: Spouse   Functional History: Prior Function Level of Independence: Independent with assistive device(s) Comments: Prior to his admission 07/26/20, pt utilized a walking stick for ambulation on unlevel surfaces.  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[employer] Status:  Mobility: Bed Mobility Overal bed mobility: Needs Assistance Bed Mobility: Supine to Sit Supine to sit: +2 for physical assistance, +2 for safety/equipment, Total assist, HOB elevated Sit to supine: Mod assist, +2 for physical assistance, +2 for safety/equipment, HOB elevated General bed mobility comments: Pt lethargic, minimally opening eyes and not initiating when supine, thus TAx2 for bed mobility. Transfers Overall  transfer level: Needs assistance Equipment used: 2 person hand held assist Transfers: Sit to/from Stand, Stand Pivot Transfers Sit to Stand: Max assist, +2 physical assistance, +2 safety/equipment, From elevated surface Stand pivot transfers: Max assist, +2 physical assistance, +2 safety/equipment General transfer comment: x2 sit <> stand from elevated EOB with bil HHA, achieving full upright standing posture with maxAx2 at buttocks and intermittent bil knee block for safety. MaxAx2 to stand step to R with  pt initiating small R foot steps but not L. Ambulation/Gait Ambulation/Gait assistance: Max assist, +2 physical assistance, +2 safety/equipment Gait Distance (Feet): 1 Feet Assistive device: 2 person hand held assist Gait Pattern/deviations: Step-to pattern, Decreased step length - left, Decreased stride length, Decreased weight shift to right, Decreased weight shift to left, Decreased dorsiflexion - right, Decreased dorsiflexion - left, Trunk flexed General Gait Details: Pt taking small staggering steps with R foot but not initiating L to side step to the R for transfer to the recliner with maxAx2 and bil HHA. Gait velocity: reduced Gait velocity interpretation: <1.31 ft/sec, indicative of household ambulator    ADL: ADL Overall ADL's : Needs assistance/impaired Eating/Feeding: NPO General ADL Comments: Pt require (A) for all adls at this time  Cognition: Cognition Overall Cognitive Status: Impaired/Different from baseline Arousal/Alertness: Lethargic Orientation Level: Disoriented X4 Attention: Sustained Focused Attention: Impaired Focused Attention Impairment: Functional basic, Verbal basic Sustained Attention: Impaired Sustained Attention Impairment: Verbal basic, Functional basic Memory:  (will continue to assess) Awareness: Impaired Awareness Impairment: Intellectual impairment, Emergent impairment, Anticipatory impairment Problem Solving: Impaired Problem Solving  Impairment: Functional basic Safety/Judgment: Impaired Cognition Arousal/Alertness: Lethargic Behavior During Therapy: Impulsive, Flat affect Overall Cognitive Status: Impaired/Different from baseline Area of Impairment: Following commands, Safety/judgement, Awareness, Problem solving, Attention, Memory Current Attention Level: Alternating Memory: Decreased short-term memory Following Commands: Follows one step commands inconsistently, Follows one step commands with increased time Safety/Judgement: Decreased awareness of safety, Decreased awareness of deficits Awareness: Intellectual Problem Solving: Slow processing, Decreased initiation, Difficulty sequencing, Requires verbal cues, Requires tactile cues General Comments: Pt very lethargic, minimally opening eyes briefly with stimulation in supine. Opened eyes and maintained them open when positioned in sitting. Pt needing increased time to process and follow single step multi-modal commands inconsistently, especially with his L side. Poor awareness into his deficits and safety as he tends to not attend to his L side and unable to cross midline with his eyes towards his L. Pt also impulsively reaching towards his cortrak as the session progressed, needing cues to avoid intermittently. Difficult to assess due to: Level of arousal   Blood pressure (!) (P) 143/76, pulse (P) 60, temperature (P) 98.3 F (36.8 C), temperature source (P) Oral, resp. rate (P) 18, height _0  (1.702 m), weight 91.8 kg, SpO2 (P) 96 %. Physical Exam Vitals and nursing note reviewed. Exam conducted with a chaperone present.  Constitutional:      Comments: Lethargic and needed stimulation to get patient awake. Mittens on left hand and right was off with patient constantly rubbing incision on scalp v/s cortack  Pt absolutely exhausted- lethargic- woke briefly to tickling his feet, but went right back to sleep- wife at bedside, has mittens B/L , NAD  HENT:     Head:  Normocephalic.     Comments: R crani site- staples out- healing well; eyes closed; L facial droop noted    Right Ear: External ear normal.     Left Ear: External ear normal.     Nose: Nose normal.     Comments: Cortrak in place on R>L    Mouth/Throat:     Mouth: Mucous membranes are dry.     Pharynx: Oropharynx is clear. No oropharyngeal exudate.  Eyes:     Comments: Kept eyes closed B/L  Cardiovascular:     Rate and Rhythm: Normal rate and regular rhythm.     Heart sounds: Normal heart sounds. No murmur heard.   No gallop.  Pulmonary:     Comments:  Few rhonchi noted; coarse; no Wheezes, rales; adequate air movement noted Abdominal:     Comments: .Soft, NT, ND, (+)BS     Genitourinary:    Comments: Condom catheter placed- medium amber urine noted Musculoskeletal:     Cervical back: Normal range of motion and neck supple.     Comments: Responded to tickling- B/L in feet Grabbed our hands on R, not well on L No other compliance with MS exam  Skin:    Comments: Some ecchymoses on arms B/L - no backside breakdown  Neurological:     Mental Status: He is lethargic.     Comments: Non-verbal except for grunting sounds. Left facial weakness with puffing breaths. Right in attention? Does move all four with mac tactile/verbal cues.  Was completely asleep- woke very briefly- wearing mittens B/L  Psychiatric:     Comments: sedated    Results for orders placed or performed during the hospital encounter of 08/07/20 (from the past 48 hour(s))  Sodium     Status: Abnormal   Collection Time: 08/12/20  2:57 PM  Result Value Ref Range   Sodium 156 (H) 135 - 145 mmol/L    Comment: DELTA CHECK NOTED Performed at [facility] [address], [place], [place] [address]   Glucose, capillary     Status: Abnormal   Collection Time: 08/12/20  3:47 PM  Result Value Ref Range   Glucose-Capillary 150 (H) 70 - 99 mg/dL    Comment: Glucose reference range applies only to samples taken after  fasting for at least 8 hours.  Glucose, capillary     Status: Abnormal   Collection Time: 08/12/20  8:01 PM  Result Value Ref Range   Glucose-Capillary 169 (H) 70 - 99 mg/dL    Comment: Glucose reference range applies only to samples taken after fasting for at least 8 hours.  Basic metabolic panel     Status: Abnormal   Collection Time: 08/12/20  9:54 PM  Result Value Ref Range   Sodium 152 (H) 135 - 145 mmol/L   Potassium 3.3 (L) 3.5 - 5.1 mmol/L   Chloride 122 (H) 98 - 111 mmol/L   CO2 26 22 - 32 mmol/L   Glucose, Bld 174 (H) 70 - 99 mg/dL    Comment: Glucose reference range applies only to samples taken after fasting for at least 8 hours.   BUN 33 (H) 8 - 23 mg/dL   Creatinine, Ser 0.73 0.61 - 1.24 mg/dL   Calcium 7.8 (L) 8.9 - 10.3 mg/dL   GFR, Estimated >60 >60 mL/min    Comment: (NOTE) Calculated using the CKD-EPI Creatinine Equation (2021)    Anion gap 4 (L) 5 - 15    Comment: Performed at [facility] [address] [place], [place] [address]  Magnesium     Status: None   Collection Time: 08/12/20  9:54 PM  Result Value Ref Range   Magnesium 2.3 1.7 - 2.4 mg/dL    Comment: Performed at [facility], [place] [address], [place], [place] [address]  Phosphorus     Status: Abnormal   Collection Time: 08/12/20  9:54 PM  Result Value Ref Range   Phosphorus 1.9 (L) 2.5 - 4.6 mg/dL    Comment: Performed at [facility] [address] [place], [place] [address]  Glucose, capillary     Status: Abnormal   Collection Time: 08/12/20 11:59 PM  Result Value Ref Range   Glucose-Capillary 154 (H) 70 -  99 mg/dL    Comment: Glucose reference range applies only to samples taken after fasting for at least 8 hours.  Glucose, capillary     Status: Abnormal   Collection Time: 08/13/20  4:01 AM  Result Value Ref Range   Glucose-Capillary 146 (H) 70 - 99 mg/dL    Comment: Glucose reference range applies only to samples taken after fasting for at least 8 hours.  Basic  metabolic panel     Status: Abnormal   Collection Time: 08/13/20  5:13 AM  Result Value Ref Range   Sodium 150 (H) 135 - 145 mmol/L   Potassium 5.7 (H) 3.5 - 5.1 mmol/L   Chloride 122 (H) 98 - 111 mmol/L   CO2 26 22 - 32 mmol/L   Glucose, Bld 175 (H) 70 - 99 mg/dL    Comment: Glucose reference range applies only to samples taken after fasting for at least 8 hours.   BUN 35 (H) 8 - 23 mg/dL   Creatinine, Ser 0.69 0.61 - 1.24 mg/dL   Calcium 7.8 (L) 8.9 - 10.3 mg/dL   GFR, Estimated >60 >60 mL/min    Comment: (NOTE) Calculated using the CKD-EPI Creatinine Equation (2021)    Anion gap 2 (L) 5 - 15    Comment: Performed at [facility] [address], [place], [place] [address]  Vitamin B12     Status: Abnormal   Collection Time: 08/13/20  5:13 AM  Result Value Ref Range   Vitamin B-12 3,210 (H) 180 - 914 pg/mL    Comment: (NOTE) This assay is not validated for testing neonatal or myeloproliferative syndrome specimens for Vitamin B12 levels. Performed at [facility], [place] [address] [place], [place] [address]   Magnesium     Status: None   Collection Time: 08/13/20  5:13 AM  Result Value Ref Range   Magnesium 2.4 1.7 - 2.4 mg/dL    Comment: Performed at [facility] [address] [place], [place] [address]  Glucose, capillary     Status: Abnormal   Collection Time: 08/13/20  9:07 AM  Result Value Ref Range   Glucose-Capillary 158 (H) 70 - 99 mg/dL    Comment: Glucose reference range applies only to samples taken after fasting for at least 8 hours.   Comment 1 Notify RN    Comment 2 Document in Chart   Phosphorus     Status: Abnormal   Collection Time: 08/13/20 12:00 PM  Result Value Ref Range   Phosphorus 2.2 (L) 2.5 - 4.6 mg/dL    Comment: Performed at [facility] [address] [place], [place] [address]  Glucose, capillary     Status: Abnormal   Collection Time: 08/13/20 12:15 PM  Result Value Ref Range   Glucose-Capillary 158 (H) 70 - 99  mg/dL    Comment: Glucose reference range applies only to samples taken after fasting for at least 8 hours.   Comment 1 Notify RN    Comment 2 Document in Chart   Glucose, capillary     Status: Abnormal   Collection Time: 08/13/20  4:38 PM  Result Value Ref Range   Glucose-Capillary 164 (H) 70 - 99 mg/dL    Comment: Glucose reference range applies only to samples taken after fasting for at least 8 hours.   Comment 1 Notify RN    Comment 2 Document in Chart   Glucose, capillary     Status: Abnormal   Collection Time: 08/13/20  8:27 PM  Result Value Ref Range   Glucose-Capillary 141 (H) 70 - 99 mg/dL    Comment: Glucose reference range applies only to samples taken after fasting for at least 8 hours.  Glucose, capillary     Status: Abnormal   Collection Time: 08/14/20 12:54 AM  Result Value Ref Range   Glucose-Capillary 176 (H) 70 - 99 mg/dL    Comment: Glucose reference range applies only to samples taken after fasting for at least 8 hours.  Basic metabolic panel     Status: Abnormal   Collection Time: 08/14/20  4:14 AM  Result Value Ref Range   Sodium 150 (H) 135 - 145 mmol/L   Potassium 3.4 (L) 3.5 - 5.1 mmol/L   Chloride 118 (H) 98 - 111 mmol/L   CO2 26 22 - 32 mmol/L   Glucose, Bld 173 (H) 70 - 99 mg/dL    Comment: Glucose reference range applies only to samples taken after fasting for at least 8 hours.   BUN 37 (H) 8 - 23 mg/dL   Creatinine, Ser 0.69 0.61 - 1.24 mg/dL   Calcium 8.1 (L) 8.9 - 10.3 mg/dL   GFR, Estimated >60 >60 mL/min    Comment: (NOTE) Calculated using the CKD-EPI Creatinine Equation (2021)    Anion gap 6 5 - 15    Comment: Performed at [facility] [address], [place], [place] [address]  Glucose, capillary     Status: Abnormal   Collection Time: 08/14/20  4:33 AM  Result Value Ref Range   Glucose-Capillary 170 (H) 70 - 99 mg/dL    Comment: Glucose reference range applies only to samples taken after fasting for at least 8 hours.  Glucose,  capillary     Status: Abnormal   Collection Time: 08/14/20  8:07 AM  Result Value Ref Range   Glucose-Capillary 143 (H) 70 - 99 mg/dL    Comment: Glucose reference range applies only to samples taken after fasting for at least 8 hours.  Glucose, capillary     Status: Abnormal   Collection Time: 08/14/20 11:44 AM  Result Value Ref Range   Glucose-Capillary 148 (H) 70 - 99 mg/dL    Comment: Glucose reference range applies only to samples taken after fasting for at least 8 hours.   No results found.     Medical Problem List and Plan: 1.  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  Antithrombotics: -DVT/anticoagulation:  SQ Lovenox  -antiplatelet therapy: ASA 325 mg/day 3. Pain Management: tylenol prn 4. Mood: LCSW to follow for evaluation and support.   -antipsychotic agents: N/A 5. Neuropsych: This patient is not fully capable of making decisions on his own behalf. 6. Skin/Wound Care: Routine pressure relief measures.  7. Fluids/Electrolytes/Nutrition: NPO with tube feeds.   -water flushes 175 cc every 4 hours 8. HTN: Monitor BP tid with SBP goal <140/90.  --continue losartan/day, cardura/day and Metoprolol every 6 hours 9. T2DM: Hgb A1C- 6.2. Monitor BS evr 10. Lethargy: On Amantadine since 06/09 for activation. Might require Ritalin as well.  11. PAF: Monitor HR tid--question DOAC 10 days post stroke (On 06/13?).  --SDH post fall 07/27/20  --continue Tikosyn bid  --Will consult pharmacy to help keep to keep K+>4.0/Mg levels supplemented. 12. Thoracic aortic aneurysm: Followed by [facility].  13. Induced hypernatremia: Improving with increase water flushes 14. Hypokalemia:  Question dilutional-- pharmacy assisting with management.         Bary Leriche, PA-C 08/14/2020   I have personally performed a face to face  diagnostic evaluation of this patient and formulated the key components of the plan.  Additionally, I have personally reviewed laboratory data, imaging studies, as well as relevant notes and concur with the physician assistant's documentation above.

## 2020-08-14 NOTE — PMR Pre-admission (Signed)
PMR Admission Coordinator Pre-Admission Assessment  Patient: [name] [name] is an 80 y.o., male MRN: [id number] DOB: 1940-07-04 Height: _0  (170.2 cm) Weight: 91.8 kg  Insurance Information HMO:     PPO:      PCP:      IPA:      80/20:      OTHER:  PRIMARY: Medicare A and B      Policy#: [id number]            Subscriber:  CM Name:       Phone#:      Fax#:  Pre-Cert#:       Employer:  Benefits:  Phone #:      Name:  Eff. Date: A 09/05/07, B 03/07/08     Deduct: $1556      Out of Pocket Max: n/a      Life Max: n/a CIR: 100%      SNF: 20 full days Outpatient: 80%     Co-Ins: 20% Home Health: 100%      Co-Pay:  DME: 80%     Co-Ins: 20% Providers:  SECONDARY: BCBS Supplemental       Policy#: [id number]     Phone#:   Development worker, community:       Phone#:   The Engineer, petroleum" for patients in Inpatient Rehabilitation Facilities with attached "Privacy Act [place] Records" was provided and verbally reviewed with: Patient and Family  Emergency Contact Information Contact Information     Name Relation Home Work Mobile   Aiea S Spouse   [phone]   Carmen, Vallecillo [phone]     Kwali, Wrinkle   [phone]       Current Medical History  Patient Admitting Diagnosis: SDH s/p crani 5/24 and CVA on 6/3 History of Present Illness: [name] [name] is a 80 y.o. right-handed male with history of rheumatic heart disease, atrial fibrillation, diastolic congestive heart failure, aortic valve replacement, mitral valve replacement, dysrhythmia maintained on Tikosyn, prediabetes.  Pt had a fall at church on 5/22 with positive loss of consciousness.  Cranial CT scan initially negative on presentation to ED on 5/22 so he was discharged home.  On 5/23 pt developed AMS and patient decompensated quickly with EMS.  Presented to [facility] again on 5/23 with follow-up CT showing acute right much larger than left subdural hematomas.  Additional small volume  subdural hemorrhage along the falx and tentorium.  Mass-effect including subfalcine herniation with leftward midline shift of 1.8 cm and early trapping of the left lateral ventricle.  Initially pt without corneal or gag reflex, and posturing with poor prognosis.  Overnight, pt improved significantly with return of reflexes and following commands.  Patient underwent right craniotomy for evacuation of subdural hematoma 07/28/2020 per Dr. Sherley Bounds.  He was extubated 07/29/2020.  Decadron protocol as indicated.  Maintained on Keppra for seizure prophylaxis.  Tolerating a regular diet.  [facility] and progressing well with therapies.  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  Code stroke was initiated.  Pt transferred back to acute and underwent revascularization per Dr. DEstanislado Pandyon 6/3.  Therapy evaluations were completed and pt was again recommended for CIR.    Complete NIHSS TOTAL: 24  [facility] by the [facility] admission coordinator and physician.  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  Prior Rehab/Hospitalizations Has the patient had prior rehab or hospitalizations prior to admission? Yes  Has the patient had major surgery during 100 days prior to admission? Yes   Current Medications  Current Facility-Administered Medications:    acetaminophen (TYLENOL) tablet 650 mg, 650 mg, Oral, Q4H PRN **OR** acetaminophen (TYLENOL) 160 MG/5ML solution 650 mg, 650 mg, Per Tube, Q4H PRN **OR** acetaminophen (TYLENOL) suppository 650 mg, 650 mg, Rectal, Q4H PRN, Bhagat, Srishti L, MD   amantadine (SYMMETREL) capsule 100 mg, 100 mg, Per Tube, BID, Henri Medal, RPH, 100 mg at 08/14/20 0160   aspirin tablet 325 mg, 325 mg, Per Tube, Daily, Hammons, Kimberly B, RPH, 325 mg at 08/14/20 1027   atorvastatin (LIPITOR) tablet 80 mg, 80 mg, Per Tube, QPM, Bhagat, Srishti L, MD, 80 mg at 08/13/20 1742   chlorhexidine (PERIDEX) 0.12 % solution 15 mL, 15 mL, Mouth Rinse, BID, [name], Pramod S, MD, 15 mL at 08/14/20 1029   chlorhexidine gluconate (MEDLINE KIT) (PERIDEX) 0.12 % solution 15 mL, 15 mL, Mouth Rinse, BID, Bhagat, Srishti L, MD, 15 mL at 08/14/20 1027   Chlorhexidine Gluconate Cloth 2 % PADS 6 each, 6 each, Topical, Daily, Agarwala, Ravi, MD, 6 each at 08/14/20 1029   dofetilide (TIKOSYN) capsule 500 mcg, 500 mcg, Per Tube, BID, Bhagat, Srishti L, MD, 500 mcg at 08/14/20 1205   doxazosin (CARDURA) tablet 2 mg, 2 mg, Per Tube, Daily, Bhagat, Srishti L, MD, 2 mg at 08/14/20 1026   enoxaparin (LOVENOX) injection 40 mg, 40 mg, Subcutaneous, Daily, Al Masry, Mahmoud, MD, 40 mg at 08/14/20 1028   feeding  supplement (OSMOLITE 1.5 CAL) liquid 1,000 mL, 1,000 mL, Per Tube, Continuous, Chand, Sudham, MD, Last Rate: 55 mL/hr at 08/13/20 1900, 1,000 mL at 08/13/20 1900   feeding supplement (PROSource TF) liquid 45 mL, 45 mL, Per Tube, TID, Jacky Kindle, MD, 45 mL at 08/14/20 1028   fentaNYL (SUBLIMAZE) injection 25 mcg, 25 mcg, Intravenous, Q15 min PRN, Bhagat, Srishti L, MD   fentaNYL (SUBLIMAZE) injection 25-100 mcg, 25-100 mcg, Intravenous, Q30 min PRN, Bhagat, Srishti L, MD, 50 mcg at 08/11/20 1302   free water 175 mL, 175 mL, Per Tube, Q4H,  Pokhrel, Laxman, MD, 175 mL at 08/14/20 1159   hydrALAZINE (APRESOLINE) injection 20 mg, 20 mg, Intravenous, Q6H PRN, [name], Pramod S, MD   insulin aspart (novoLOG) injection 0-9 Units, 0-9 Units, Subcutaneous, Q4H, Bhagat, Srishti L, MD, 1 Units at 08/14/20 1206   labetalol (NORMODYNE) injection 20-40 mg, 20-40 mg, Intravenous, Q2H PRN, [name], Pramod S, MD   losartan (COZAAR) tablet 25 mg, 25 mg, Per Tube, Daily, [place] Crane M, PA-C, 25 mg at 08/14/20 1027   MEDLINE mouth rinse, 15 mL, Mouth Rinse, q12n4p, [name], Pramod S, MD, 15 mL at 08/14/20 1159   metoprolol tartrate (LOPRESSOR) 25 mg/10 mL oral suspension 12.5 mg, 12.5 mg, Per Tube, Q6H, Bhagat, Srishti L, MD, 12.5 mg at 08/14/20 1205   pantoprazole sodium (PROTONIX) 40 mg/20 mL oral suspension 40 mg, 40 mg, Per Tube, Daily, Bowser, Grace E, NP, 40 mg at 08/14/20 1028   polyethylene glycol (MIRALAX / GLYCOLAX) packet 17 g, 17 g, Per Tube, Daily, Bhagat, Srishti L, MD, 17 g at 08/13/20 1041   senna-docusate (Senokot-S) tablet 1 tablet, 1 tablet, Per Tube, QHS PRN, Bhagat, Srishti L, MD, 1 tablet at 08/13/20 2038   senna-docusate (Senokot-S) tablet 1 tablet, 1 tablet, Per Tube, BID, Corey Harold, NP, 1 tablet at 08/13/20 2227   sodium chloride flush (NS) 0.9 % injection 10-40 mL, 10-40 mL, Intracatheter, Q12H, Bowser, Grace E, NP, 10 mL at 08/14/20 1025   sodium chloride flush (NS) 0.9 % injection  10-40 mL, 10-40 mL, Intracatheter, PRN, Bowser, Grace E, NP, 10 mL at 08/09/20 2157   sodium phosphate (FLEET) 7-19 GM/118ML enema 1 enema, 1 enema, Rectal, Once, Merlene Laughter F, NP  Patients Current Diet:  Diet Order             Diet NPO time specified  Diet effective now                   Precautions / Restrictions Precautions Precautions: Fall Precaution Comments: cortrak, R mitten Restrictions Weight Bearing Restrictions: No   Has the patient had 2 or more falls or a fall with injury in the past year? 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 Independent  Indoor Mobility: Did the patient need assistance with walking from room to room (with or without device)? Needed some help  Stairs: Did the patient need assistance with internal or external stairs (with or without device)? Independent  Functional Cognition: Did the patient need help planning regular tasks such as shopping or remembering to take medications? Needed some help  Home Assistive Devices / [place] Devices/Equipment: None  Prior Device Use: Indicate devices/aids used by the patient prior to current illness, exacerbation or injury?  cane  Current Functional Level Cognition  Arousal/Alertness: Lethargic Overall Cognitive Status: Impaired/Different from baseline Difficult to assess due to: Level of arousal Current Attention Level: Alternating Orientation Level: Disoriented X4 Following Commands: Follows one step commands inconsistently, Follows one step commands with increased time Safety/Judgement: Decreased awareness of safety, Decreased awareness of deficits General Comments: Pt very lethargic, minimally opening eyes briefly with stimulation in supine. Opened eyes and maintained them open when positioned in  sitting. Pt needing increased time to process and follow single step multi-modal commands inconsistently, especially with his L side. Poor awareness into his deficits and safety as he tends to not  attend to his L side and unable to cross midline with his eyes towards his L. Pt also impulsively reaching towards his cortrak as the session progressed, needing cues to avoid intermittently. Attention: Sustained Focused Attention: Impaired Focused Attention Impairment: Functional basic, Verbal basic Sustained Attention: Impaired Sustained Attention Impairment: Verbal basic, Functional basic Memory:  (will continue to assess) Awareness: Impaired Awareness Impairment: Intellectual impairment, Emergent impairment, Anticipatory impairment Problem Solving: Impaired Problem Solving Impairment: Functional basic Safety/Judgment: Impaired    Extremity Assessment (includes Sensation/Coordination)  Upper Extremity Assessment: LUE deficits/detail LUE Deficits / Details: 4 / 5 grossly when activated. pt will spontaneously use arm but can not get to follow command. pt reaching for wife with L hand during session LUE Coordination: decreased fine motor, decreased gross motor  Lower Extremity Assessment: Defer to PT evaluation LLE Deficits / Details: Spontaneous movement of L lower extremity in supine and sitting against gravity, activating his L leg muscles for functional mobility but having difficulty following commands LLE Coordination: decreased gross motor    ADLs  Overall ADL's : Needs assistance/impaired Eating/Feeding: NPO General ADL Comments: Pt require (A) for all adls at this time    Mobility  Overal bed mobility: Needs Assistance Bed Mobility: Supine to Sit Supine to sit: +2 for physical assistance, +2 for safety/equipment, Total assist, HOB elevated Sit to supine: Mod assist, +2 for physical assistance, +2 for safety/equipment, HOB elevated General bed mobility comments: Pt lethargic,  minimally opening eyes and not initiating when supine, thus TAx2 for bed mobility.    Transfers  Overall transfer level: Needs assistance Equipment used: 2 person hand held assist Transfers: Sit to/from Stand, Stand Pivot Transfers Sit to Stand: Max assist, +2 physical assistance, +2 safety/equipment, From elevated surface Stand pivot transfers: Max assist, +2 physical assistance, +2 safety/equipment General transfer comment: x2 sit <> stand from elevated EOB with bil HHA, achieving full upright standing posture with maxAx2 at buttocks and intermittent bil knee block for safety. MaxAx2 to stand step to R with pt initiating small R foot steps but not L.    Ambulation / Gait / Stairs / Wheelchair Mobility  Ambulation/Gait Ambulation/Gait assistance: Max assist, +2 physical assistance, +2 safety/equipment Gait Distance (Feet): 1 Feet Assistive device: 2 person hand held assist Gait Pattern/deviations: Step-to pattern, Decreased step length - left, Decreased stride length, Decreased weight shift to right, Decreased weight shift to left, Decreased dorsiflexion - right, Decreased dorsiflexion - left, Trunk flexed General Gait Details: Pt taking small staggering steps with R foot but not initiating L to side step to the R for transfer to the recliner with maxAx2 and bil HHA. Gait velocity: reduced Gait velocity interpretation: <1.31 ft/sec, indicative of household ambulator    Posture / Balance Dynamic Sitting Balance Sitting balance - Comments: 1-2 UE support with min-modA support at trunk. Balance Overall balance assessment: Needs assistance Sitting-balance support: Feet supported, Single extremity supported, Bilateral upper extremity supported Sitting balance-Leahy Scale: Poor Sitting balance - Comments: 1-2 UE support with min-modA support at trunk. Postural control: Left lateral lean, Posterior lean, Other (comment) (eventually anterior lean sitting EOB as pt fatigued) Standing balance  support: Bilateral upper extremity supported Standing balance-Leahy Scale: Poor Standing balance comment: MaxAx2 and bil UE support to stand.    Special needs/care consideration Continuous Drip IV  tube feeds and Diabetic management yes   Previous Home Environment (from acute therapy documentation) Living Arrangements: Spouse/significant other  Lives With: Spouse Available Help at Discharge: Family, Available 24 hours/day Type of Home: House  Home Layout: One level Home Access: Level entry Bathroom Shower/Tub: Multimedia programmer: Standard Home Care Services: No Additional Comments: Pt has a shower bench built-in to the side of the handheld shower head. x1 vertical grab bar at site of handheld shower head.  Discharge Living Setting Plans for Discharge Living Setting: Other (Comment) (ILF) Type of Home at Discharge: [place] Name at Discharge: [facility] Discharge Home Layout: One level Discharge Home Access: Level entry Discharge Bathroom Toilet: Handicapped height Discharge Bathroom Accessibility: Yes How Accessible: Accessible via walker Does the patient have any problems obtaining your medications?: No  Social/Family/Support Systems Patient Roles: Spouse Anticipated Caregiver: spouse Terrall Bley, and sons Broadus John and Donahue Anticipated Caregiver's Contact Information: Herbert Pun [phone]; Broadus John [phone]; Warner Mccreedy [phone] Ability/Limitations of Caregiver: n/a Caregiver Availability: 24/7 Discharge Plan Discussed with Primary Caregiver: Yes Is Caregiver In Agreement with Plan?: Yes Does Caregiver/Family have Issues with Lodging/Transportation while Pt is in Rehab?: No  Goals Patient/Family Goal for Rehab: PT/OT/SLP supervision to min assist Expected length of stay: 18-21 days Pt/Family Agrees to Admission and willing to participate: Yes Program Orientation Provided & Reviewed with Pt/Caregiver Including Roles  &  Responsibilities: Yes  Barriers to Discharge: Other (comments) (level of assist)  Decrease burden of Care through IP rehab admission: n/a  Possible need for SNF placement upon discharge: Not anticipated.   Patient Condition: I have reviewed medical records from [facility], spoken with CM, and patient and spouse. I met with patient at the bedside for inpatient rehabilitation assessment.  Patient will benefit from ongoing PT, OT, and SLP, can actively participate in 3 hours of therapy a day 5 days of the week, and can make measurable gains during the admission.  Patient will also benefit from the coordinated team approach during an Inpatient Acute Rehabilitation admission.  The patient will receive intensive therapy as well as Rehabilitation physician, nursing, social worker, and care management interventions.  Due to bladder management, bowel management, safety, skin/wound care, disease management, medication administration, pain management, and patient education the patient requires 24 hour a day rehabilitation nursing.  The patient is currently max +2 with mobility and basic ADLs.  Discharge setting and therapy post discharge at home with home health is anticipated.  Patient has agreed to participate in the Acute Inpatient Rehabilitation Program and will admit Saturday, 6/11.  Preadmission Screen Completed By:  Michel Santee, PT, DPT 08/14/2020 2:58 PM ______________________________________________________________________   Discussed status with Dr. Dagoberto Ligas on 08/14/20  at 3:03 PM  and received approval for admission today.  Admission Coordinator:  Michel Santee, PT, DPT time 3:04 PM Sudie Grumbling 08/14/20   Assessment/Plan: Diagnosis: Does the need for close, 24 hr/day Medical supervision in concert with the patient's rehab needs make it unreasonable for this patient to be served in a less intensive setting? Yes Co-Morbidities requiring supervision/potential complications: Afib, dCHF, pre-DM,  rheumatic heart diease; R>>L SDH's s/p crani; valve replacements and new R MCA stroke Due to bladder management, bowel management, safety, skin/wound care, disease management, medication administration, pain management, and patient education, does the patient require 24 hr/day rehab nursing? Yes Does the patient require coordinated care of a physician, rehab nurse, PT, OT, and SLP to address physical and functional deficits in the context of the above medical diagnosis(es)? Yes Addressing deficits in the following areas: balance, endurance, locomotion, strength, transferring, bowel/bladder control, bathing, dressing, feeding, grooming, toileting, cognition, speech, and swallowing Can the patient actively participate in an intensive therapy program of at least  3 hrs of therapy 5 days a week? Yes The potential for patient to make measurable gains while on inpatient rehab is good Anticipated functional outcomes upon discharge from inpatient rehab: supervision and min assist PT, supervision and min assist OT, supervision and min assist SLP Estimated rehab length of stay to reach the above functional goals is: 18-21 days Anticipated discharge destination: Home 10. Overall Rehab/Functional Prognosis: good   MD Signature:

## 2020-08-14 NOTE — Progress Notes (Signed)
Inpatient Rehab Admissions Coordinator:   I have a bed for this pt to admit to CIR on Saturday (6/11).  Dr. Louanne Belton in agreement.  Rehab MD (Dr. Dagoberto Ligas) to assess pt and confirm admission on Saturday.  Floor RN can call CIR at [phone] for report after 12pm on Saturday.  I have let pt/family and case manager know.    Shann Medal, PT, DPT Admissions Coordinator [phone] 08/14/20  2:57 PM

## 2020-08-14 NOTE — Progress Notes (Signed)
PT Cancellation Note  Patient Details Name: [name] [name] MRN: [id number] DOB: 1940/04/04   Cancelled Treatment:    Reason Eval/Treat Not Completed: Other (comment) Patient currently being cleaned from BM by NT. PT will re-attempt as time allows.   Michayla Mcneil A. Gilford Rile PT, DPT Acute Rehabilitation Services Pager [phone] Office [phone]    Linna Hoff 08/14/2020, 3:51 PM

## 2020-08-14 NOTE — Progress Notes (Signed)
Pharmacy: Dofetilide (Tikosyn) - Follow Up Assessment and Electrolyte Replacement  Pharmacy consulted to assist in monitoring and replacing electrolytes in this 80 y.o. male admitted on 08/07/2020 undergoing dofetilide continuation.   Labs:    Component Value Date/Time   K 3.4 (L) 08/14/2020 0414   K 4.1 11/26/2013 0000   MG 2.4 08/13/2020 0513     Plan: Potassium: K < 3.5:  Give KCl 40 mEq q4hr x2   Magnesium: Mg > 2: No additional supplementation needed   Thank you for allowing pharmacy to participate in this patient's care   Renold Genta, PharmD, BCPS Clinical Pharmacist Clinical phone for 08/14/2020 until 3p is [phone] 08/14/2020 9:58 AM  **Pharmacist phone directory can be found on [email or web address] listed under [place]**

## 2020-08-14 NOTE — Care Management Important Message (Signed)
Important Message  Patient Details  Name: [name] [name] MRN: [id number] Date of Birth: 10-29-1940   Medicare Important Message Given:  Yes     Orbie Pyo 08/14/2020, 11:35 AM

## 2020-08-14 NOTE — Progress Notes (Signed)
Inpatient Rehab Admissions Coordinator:   Met with patient and his wife at the bedside.  They are hopeful to return to CIR as soon as possible.  I explained that with readmit to acute, goals and estimated length of stay would likely "re-start" once readmitted to CIR.  I do have a bed available for tomorrow and will reach out to Dr. Louanne Belton to see if pt will be ready to admit.   Shann Medal, PT, DPT Admissions Coordinator [phone] 08/14/20  1:20 PM

## 2020-08-15 ENCOUNTER — Inpatient Hospital Stay (HOSPITAL_COMMUNITY)
Admission: RE | Admit: 2020-08-15 | Discharge: 2020-09-17 | DRG: 057 | Disposition: A | Discharge status: Skilled Nursing Facility | Payer: Medicare Other | Source: Intra-hospital | Attending: Physical Medicine & Rehabilitation | Admitting: Physical Medicine & Rehabilitation

## 2020-08-15 ENCOUNTER — Encounter (HOSPITAL_COMMUNITY): Payer: Self-pay | Admitting: Physical Medicine & Rehabilitation

## 2020-08-15 ENCOUNTER — Other Ambulatory Visit: Payer: Self-pay

## 2020-08-15 DIAGNOSIS — I482 Chronic atrial fibrillation, unspecified: Secondary | ICD-10-CM

## 2020-08-15 DIAGNOSIS — E119 Type 2 diabetes mellitus without complications: Secondary | ICD-10-CM | POA: Diagnosis present

## 2020-08-15 DIAGNOSIS — I6931 Attention and concentration deficit following cerebral infarction: Secondary | ICD-10-CM | POA: Diagnosis not present

## 2020-08-15 DIAGNOSIS — Z952 Presence of prosthetic heart valve: Secondary | ICD-10-CM | POA: Diagnosis not present

## 2020-08-15 DIAGNOSIS — Z7901 Long term (current) use of anticoagulants: Secondary | ICD-10-CM | POA: Diagnosis not present

## 2020-08-15 DIAGNOSIS — I69354 Hemiplegia and hemiparesis following cerebral infarction affecting left non-dominant side: Secondary | ICD-10-CM | POA: Diagnosis not present

## 2020-08-15 DIAGNOSIS — D72828 Other elevated white blood cell count: Secondary | ICD-10-CM | POA: Diagnosis present

## 2020-08-15 DIAGNOSIS — I6521 Occlusion and stenosis of right carotid artery: Secondary | ICD-10-CM | POA: Diagnosis present

## 2020-08-15 DIAGNOSIS — S065XAA Traumatic subdural hemorrhage with loss of consciousness status unknown, initial encounter: Secondary | ICD-10-CM | POA: Diagnosis present

## 2020-08-15 DIAGNOSIS — Z96641 Presence of right artificial hip joint: Secondary | ICD-10-CM | POA: Diagnosis present

## 2020-08-15 DIAGNOSIS — I63511 Cerebral infarction due to unspecified occlusion or stenosis of right middle cerebral artery: Secondary | ICD-10-CM

## 2020-08-15 DIAGNOSIS — E876 Hypokalemia: Secondary | ICD-10-CM | POA: Diagnosis present

## 2020-08-15 DIAGNOSIS — I69392 Facial weakness following cerebral infarction: Secondary | ICD-10-CM | POA: Diagnosis not present

## 2020-08-15 DIAGNOSIS — Z452 Encounter for adjustment and management of vascular access device: Secondary | ICD-10-CM | POA: Diagnosis not present

## 2020-08-15 DIAGNOSIS — S065X9A Traumatic subdural hemorrhage with loss of consciousness of unspecified duration, initial encounter: Secondary | ICD-10-CM | POA: Diagnosis not present

## 2020-08-15 DIAGNOSIS — Z9181 History of falling: Secondary | ICD-10-CM | POA: Diagnosis not present

## 2020-08-15 DIAGNOSIS — S065X0S Traumatic subdural hemorrhage without loss of consciousness, sequela: Secondary | ICD-10-CM

## 2020-08-15 DIAGNOSIS — B961 Klebsiella pneumoniae [K. pneumoniae] as the cause of diseases classified elsewhere: Secondary | ICD-10-CM | POA: Diagnosis not present

## 2020-08-15 DIAGNOSIS — I480 Paroxysmal atrial fibrillation: Secondary | ICD-10-CM | POA: Diagnosis present

## 2020-08-15 DIAGNOSIS — I6601 Occlusion and stenosis of right middle cerebral artery: Secondary | ICD-10-CM

## 2020-08-15 DIAGNOSIS — R140 Abdominal distension (gaseous): Secondary | ICD-10-CM | POA: Diagnosis not present

## 2020-08-15 DIAGNOSIS — Z87820 Personal history of traumatic brain injury: Secondary | ICD-10-CM | POA: Diagnosis not present

## 2020-08-15 DIAGNOSIS — I5032 Chronic diastolic (congestive) heart failure: Secondary | ICD-10-CM | POA: Diagnosis present

## 2020-08-15 DIAGNOSIS — I6200 Nontraumatic subdural hemorrhage, unspecified: Secondary | ICD-10-CM | POA: Diagnosis not present

## 2020-08-15 DIAGNOSIS — I110 Hypertensive heart disease with heart failure: Secondary | ICD-10-CM | POA: Diagnosis present

## 2020-08-15 DIAGNOSIS — R569 Unspecified convulsions: Secondary | ICD-10-CM | POA: Diagnosis not present

## 2020-08-15 DIAGNOSIS — R32 Unspecified urinary incontinence: Secondary | ICD-10-CM | POA: Diagnosis present

## 2020-08-15 DIAGNOSIS — Z743 Need for continuous supervision: Secondary | ICD-10-CM | POA: Diagnosis not present

## 2020-08-15 DIAGNOSIS — E785 Hyperlipidemia, unspecified: Secondary | ICD-10-CM | POA: Diagnosis present

## 2020-08-15 DIAGNOSIS — R1312 Dysphagia, oropharyngeal phase: Secondary | ICD-10-CM

## 2020-08-15 DIAGNOSIS — I69318 Other symptoms and signs involving cognitive functions following cerebral infarction: Secondary | ICD-10-CM | POA: Diagnosis not present

## 2020-08-15 DIAGNOSIS — Z8673 Personal history of transient ischemic attack (TIA), and cerebral infarction without residual deficits: Secondary | ICD-10-CM | POA: Diagnosis not present

## 2020-08-15 DIAGNOSIS — E870 Hyperosmolality and hypernatremia: Secondary | ICD-10-CM | POA: Diagnosis present

## 2020-08-15 DIAGNOSIS — I712 Thoracic aortic aneurysm, without rupture: Secondary | ICD-10-CM | POA: Diagnosis present

## 2020-08-15 DIAGNOSIS — Z833 Family history of diabetes mellitus: Secondary | ICD-10-CM

## 2020-08-15 DIAGNOSIS — Z4682 Encounter for fitting and adjustment of non-vascular catheter: Secondary | ICD-10-CM | POA: Diagnosis not present

## 2020-08-15 DIAGNOSIS — G8929 Other chronic pain: Secondary | ICD-10-CM | POA: Diagnosis present

## 2020-08-15 DIAGNOSIS — N390 Urinary tract infection, site not specified: Secondary | ICD-10-CM | POA: Diagnosis not present

## 2020-08-15 DIAGNOSIS — G9389 Other specified disorders of brain: Secondary | ICD-10-CM | POA: Diagnosis not present

## 2020-08-15 DIAGNOSIS — I69391 Dysphagia following cerebral infarction: Secondary | ICD-10-CM

## 2020-08-15 DIAGNOSIS — Z4659 Encounter for fitting and adjustment of other gastrointestinal appliance and device: Secondary | ICD-10-CM

## 2020-08-15 DIAGNOSIS — I639 Cerebral infarction, unspecified: Secondary | ICD-10-CM | POA: Diagnosis not present

## 2020-08-15 DIAGNOSIS — Z8249 Family history of ischemic heart disease and other diseases of the circulatory system: Secondary | ICD-10-CM

## 2020-08-15 DIAGNOSIS — Z20822 Contact with and (suspected) exposure to covid-19: Secondary | ICD-10-CM | POA: Diagnosis present

## 2020-08-15 DIAGNOSIS — B965 Pseudomonas (aeruginosa) (mallei) (pseudomallei) as the cause of diseases classified elsewhere: Secondary | ICD-10-CM | POA: Diagnosis not present

## 2020-08-15 DIAGNOSIS — S069X3S Unspecified intracranial injury with loss of consciousness of 1 hour to 5 hours 59 minutes, sequela: Secondary | ICD-10-CM | POA: Diagnosis not present

## 2020-08-15 DIAGNOSIS — Z9889 Other specified postprocedural states: Secondary | ICD-10-CM | POA: Diagnosis not present

## 2020-08-15 DIAGNOSIS — G479 Sleep disorder, unspecified: Secondary | ICD-10-CM | POA: Diagnosis not present

## 2020-08-15 DIAGNOSIS — A499 Bacterial infection, unspecified: Secondary | ICD-10-CM | POA: Diagnosis not present

## 2020-08-15 DIAGNOSIS — S069X3A Unspecified intracranial injury with loss of consciousness of 1 hour to 5 hours 59 minutes, initial encounter: Secondary | ICD-10-CM

## 2020-08-15 DIAGNOSIS — G4700 Insomnia, unspecified: Secondary | ICD-10-CM | POA: Diagnosis present

## 2020-08-15 DIAGNOSIS — G936 Cerebral edema: Secondary | ICD-10-CM | POA: Diagnosis not present

## 2020-08-15 DIAGNOSIS — M5442 Lumbago with sciatica, left side: Secondary | ICD-10-CM | POA: Diagnosis present

## 2020-08-15 DIAGNOSIS — I4891 Unspecified atrial fibrillation: Secondary | ICD-10-CM | POA: Diagnosis not present

## 2020-08-15 LAB — BASIC METABOLIC PANEL
Anion gap: 7 (ref 5–15)
BUN: 34 mg/dL — ABNORMAL HIGH (ref 8–23)
CO2: 25 mmol/L (ref 22–32)
Calcium: 8.2 mg/dL — ABNORMAL LOW (ref 8.9–10.3)
Chloride: 114 mmol/L — ABNORMAL HIGH (ref 98–111)
Creatinine, Ser: 0.74 mg/dL (ref 0.61–1.24)
GFR, Estimated: >60 mL/min (ref >60)
Glucose, Bld: 158 mg/dL — ABNORMAL HIGH (ref 70–99)
Potassium: 3.6 mmol/L (ref 3.5–5.1)
Sodium: 146 mmol/L — ABNORMAL HIGH (ref 135–145)

## 2020-08-15 LAB — GLUCOSE, CAPILLARY
Glucose-Capillary: 143 mg/dL — ABNORMAL HIGH (ref 70–99)
Glucose-Capillary: 178 mg/dL — ABNORMAL HIGH (ref 70–99)
Glucose-Capillary: 155 mg/dL — ABNORMAL HIGH (ref 70–99)
Glucose-Capillary: 121 mg/dL — ABNORMAL HIGH (ref 70–99)
Glucose-Capillary: 123 mg/dL — ABNORMAL HIGH (ref 70–99)

## 2020-08-15 LAB — MAGNESIUM: Magnesium: 2.1 mg/dL (ref 1.7–2.4)

## 2020-08-15 MED ORDER — AMANTADINE HCL 100 MG PO CAPS
100.0000 mg | ORAL_CAPSULE | Freq: Two times a day (BID) | ORAL | Status: DC
  Filled 2020-08-15: qty 1

## 2020-08-15 MED ORDER — ASPIRIN 325 MG PO TABS: 325.0000 mg | ORAL_TABLET | Freq: Every day | ORAL | Status: DC

## 2020-08-15 MED ORDER — ATORVASTATIN CALCIUM 80 MG PO TABS: 80.0000 mg | ORAL_TABLET | Freq: Every day | ORAL | Status: DC

## 2020-08-15 MED ORDER — ATORVASTATIN CALCIUM 80 MG PO TABS
80.0000 mg | ORAL_TABLET | Freq: Every evening | ORAL | Status: DC
  Administered 2020-08-15 – 2020-09-14 (×31): 80 mg
  Filled 2020-08-15 (×32): qty 1

## 2020-08-15 MED ORDER — ACETAMINOPHEN 325 MG PO TABS
325.0000 mg | ORAL_TABLET | ORAL | Status: DC | PRN
  Administered 2020-09-02 – 2020-09-08 (×5): 650 mg via NASOGASTRIC
  Filled 2020-08-15 (×5): qty 2

## 2020-08-15 MED ORDER — BISACODYL 10 MG RE SUPP: 10.0000 mg | Freq: Every day | RECTAL | Status: DC | PRN

## 2020-08-15 MED ORDER — CHLORHEXIDINE GLUCONATE 0.12 % MT SOLN
15.0000 mL | Freq: Two times a day (BID) | OROMUCOSAL | Status: DC
  Administered 2020-08-15 – 2020-09-17 (×62): 15 mL via OROMUCOSAL
  Filled 2020-08-15 (×58): qty 15

## 2020-08-15 MED ORDER — ENOXAPARIN SODIUM 40 MG/0.4ML IJ SOSY
40.0000 mg | PREFILLED_SYRINGE | Freq: Every day | INTRAMUSCULAR | Status: DC
  Administered 2020-08-16 – 2020-08-25 (×10): 40 mg via SUBCUTANEOUS
  Filled 2020-08-15 (×10): qty 0.4

## 2020-08-15 MED ORDER — AMANTADINE HCL 100 MG PO CAPS: 100.0000 mg | ORAL_CAPSULE | Freq: Two times a day (BID) | ORAL | Status: DC

## 2020-08-15 MED ORDER — CHLORHEXIDINE GLUCONATE 0.12% ORAL RINSE (MEDLINE KIT): 15.0000 mL | Freq: Two times a day (BID) | OROMUCOSAL | Status: DC

## 2020-08-15 MED ORDER — OSMOLITE 1.5 CAL PO LIQD
1000.0000 mL | ORAL | 0 refills | Status: DC
Start: 2020-08-15 — End: 2020-09-17

## 2020-08-15 MED ORDER — SENNOSIDES-DOCUSATE SODIUM 8.6-50 MG PO TABS: 1.0000 | ORAL_TABLET | Freq: Every evening | ORAL | Status: DC | PRN

## 2020-08-15 MED ORDER — DIPHENHYDRAMINE HCL 12.5 MG/5ML PO ELIX
12.5000 mg | ORAL_SOLUTION | Freq: Four times a day (QID) | ORAL | Status: DC | PRN
  Administered 2020-08-30: 25 mg
  Filled 2020-08-15 (×2): qty 10

## 2020-08-15 MED ORDER — SENNOSIDES-DOCUSATE SODIUM 8.6-50 MG PO TABS
1.0000 | ORAL_TABLET | Freq: Two times a day (BID) | ORAL | Status: DC
  Administered 2020-08-17 – 2020-08-20 (×6): 1
  Filled 2020-08-15 (×7): qty 1

## 2020-08-15 MED ORDER — ENOXAPARIN SODIUM 40 MG/0.4ML IJ SOSY: 40.0000 mg | PREFILLED_SYRINGE | INTRAMUSCULAR | Status: DC

## 2020-08-15 MED ORDER — POTASSIUM CHLORIDE 20 MEQ PO PACK
20.0000 meq | PACK | Freq: Once | ORAL | Status: AC
  Administered 2020-08-15: 20 meq
  Filled 2020-08-15: qty 1

## 2020-08-15 MED ORDER — OSMOLITE 1.5 CAL PO LIQD
1000.0000 mL | ORAL | Status: DC
  Administered 2020-08-15 – 2020-08-17 (×3): 1000 mL
  Filled 2020-08-15 (×4): qty 1000

## 2020-08-15 MED ORDER — PROCHLORPERAZINE 25 MG RE SUPP: 12.5000 mg | Freq: Four times a day (QID) | RECTAL | Status: DC | PRN

## 2020-08-15 MED ORDER — ORAL CARE MOUTH RINSE
15.0000 mL | Freq: Two times a day (BID) | OROMUCOSAL | Status: DC
  Administered 2020-08-15 – 2020-09-17 (×55): 15 mL via OROMUCOSAL

## 2020-08-15 MED ORDER — ENOXAPARIN SODIUM 40 MG/0.4ML IJ SOSY: 40.0000 mg | PREFILLED_SYRINGE | Freq: Every day | INTRAMUSCULAR | Status: DC

## 2020-08-15 MED ORDER — PANTOPRAZOLE SODIUM 40 MG PO PACK
40.0000 mg | PACK | Freq: Every day | ORAL | Status: DC
  Administered 2020-08-16 – 2020-09-15 (×31): 40 mg
  Filled 2020-08-15 (×31): qty 20

## 2020-08-15 MED ORDER — ASPIRIN 325 MG PO TABS
325.0000 mg | ORAL_TABLET | Freq: Every day | ORAL | Status: DC
  Administered 2020-08-16 – 2020-09-11 (×27): 325 mg
  Filled 2020-08-15 (×27): qty 1

## 2020-08-15 MED ORDER — PANTOPRAZOLE SODIUM 40 MG PO PACK: 40.0000 mg | PACK | Freq: Every day | ORAL | Status: DC

## 2020-08-15 MED ORDER — METOPROLOL TARTRATE 25 MG/10 ML ORAL SUSPENSION: 12.5000 mg | Freq: Four times a day (QID) | ORAL | Status: DC

## 2020-08-15 MED ORDER — SODIUM CHLORIDE 0.9% FLUSH: 10.0000 mL | Freq: Two times a day (BID) | INTRAVENOUS | Status: DC

## 2020-08-15 MED ORDER — INSULIN ASPART 100 UNIT/ML IJ SOLN
0.0000 [IU] | INTRAMUSCULAR | Status: DC
Start: 2020-08-15 — End: 2020-09-15
  Administered 2020-08-15 (×2): 1 [IU] via SUBCUTANEOUS
  Administered 2020-08-16: 2 [IU] via SUBCUTANEOUS
  Administered 2020-08-16 (×4): 1 [IU] via SUBCUTANEOUS
  Administered 2020-08-16: 2 [IU] via SUBCUTANEOUS
  Administered 2020-08-17 (×4): 1 [IU] via SUBCUTANEOUS
  Administered 2020-08-17: 2 [IU] via SUBCUTANEOUS
  Administered 2020-08-18 (×2): 1 [IU] via SUBCUTANEOUS
  Administered 2020-08-18: 2 [IU] via SUBCUTANEOUS
  Administered 2020-08-18 – 2020-08-20 (×8): 1 [IU] via SUBCUTANEOUS
  Administered 2020-08-20: 2 [IU] via SUBCUTANEOUS
  Administered 2020-08-21 (×2): 1 [IU] via SUBCUTANEOUS
  Administered 2020-08-22 (×2): 2 [IU] via SUBCUTANEOUS
  Administered 2020-08-22 – 2020-08-23 (×2): 1 [IU] via SUBCUTANEOUS
  Administered 2020-08-23: 2 [IU] via SUBCUTANEOUS
  Administered 2020-08-23 – 2020-08-24 (×3): 1 [IU] via SUBCUTANEOUS
  Administered 2020-08-24: 2 [IU] via SUBCUTANEOUS
  Administered 2020-08-24 – 2020-08-25 (×4): 1 [IU] via SUBCUTANEOUS
  Administered 2020-08-25: 2 [IU] via SUBCUTANEOUS
  Administered 2020-08-25: 1 [IU] via SUBCUTANEOUS
  Administered 2020-08-25: 2 [IU] via SUBCUTANEOUS
  Administered 2020-08-26: 1 [IU] via SUBCUTANEOUS
  Administered 2020-08-26: 2 [IU] via SUBCUTANEOUS
  Administered 2020-08-26 – 2020-08-27 (×5): 1 [IU] via SUBCUTANEOUS
  Administered 2020-08-27 (×3): 2 [IU] via SUBCUTANEOUS
  Administered 2020-08-27 – 2020-08-28 (×2): 1 [IU] via SUBCUTANEOUS
  Administered 2020-08-28: 2 [IU] via SUBCUTANEOUS
  Administered 2020-08-28 (×3): 1 [IU] via SUBCUTANEOUS
  Administered 2020-08-28: 2 [IU] via SUBCUTANEOUS
  Administered 2020-08-29: 1 [IU] via SUBCUTANEOUS
  Administered 2020-08-29 (×3): 2 [IU] via SUBCUTANEOUS
  Administered 2020-08-30: 1 [IU] via SUBCUTANEOUS
  Administered 2020-08-30 – 2020-08-31 (×6): 2 [IU] via SUBCUTANEOUS
  Administered 2020-08-31 (×2): 1 [IU] via SUBCUTANEOUS
  Administered 2020-08-31: 2 [IU] via SUBCUTANEOUS
  Administered 2020-09-01: 1 [IU] via SUBCUTANEOUS
  Administered 2020-09-01: 2 [IU] via SUBCUTANEOUS
  Administered 2020-09-01: 1 [IU] via SUBCUTANEOUS
  Administered 2020-09-01 – 2020-09-02 (×4): 2 [IU] via SUBCUTANEOUS
  Administered 2020-09-02: 1 [IU] via SUBCUTANEOUS
  Administered 2020-09-02: 2 [IU] via SUBCUTANEOUS
  Administered 2020-09-02: 1 [IU] via SUBCUTANEOUS
  Administered 2020-09-03: 2 [IU] via SUBCUTANEOUS
  Administered 2020-09-03 – 2020-09-04 (×4): 1 [IU] via SUBCUTANEOUS
  Administered 2020-09-04 (×3): 2 [IU] via SUBCUTANEOUS
  Administered 2020-09-04: 1 [IU] via SUBCUTANEOUS
  Administered 2020-09-05 (×2): 2 [IU] via SUBCUTANEOUS
  Administered 2020-09-05 (×3): 1 [IU] via SUBCUTANEOUS
  Administered 2020-09-06 (×3): 2 [IU] via SUBCUTANEOUS
  Administered 2020-09-06 – 2020-09-07 (×4): 1 [IU] via SUBCUTANEOUS
  Administered 2020-09-07: 2 [IU] via SUBCUTANEOUS
  Administered 2020-09-07: 1 [IU] via SUBCUTANEOUS
  Administered 2020-09-07: 2 [IU] via SUBCUTANEOUS
  Administered 2020-09-08: 1 [IU] via SUBCUTANEOUS
  Administered 2020-09-08 (×2): 2 [IU] via SUBCUTANEOUS
  Administered 2020-09-08: 1 [IU] via SUBCUTANEOUS
  Administered 2020-09-09: 2 [IU] via SUBCUTANEOUS
  Administered 2020-09-09: 1 [IU] via SUBCUTANEOUS
  Administered 2020-09-09 – 2020-09-10 (×3): 2 [IU] via SUBCUTANEOUS
  Administered 2020-09-10 – 2020-09-12 (×7): 1 [IU] via SUBCUTANEOUS
  Administered 2020-09-12: 2 [IU] via SUBCUTANEOUS
  Administered 2020-09-13: 1 [IU] via SUBCUTANEOUS
  Administered 2020-09-13: 2 [IU] via SUBCUTANEOUS
  Administered 2020-09-13: 1 [IU] via SUBCUTANEOUS
  Administered 2020-09-13: 2 [IU] via SUBCUTANEOUS
  Administered 2020-09-14: 1 [IU] via SUBCUTANEOUS
  Administered 2020-09-14: 2 [IU] via SUBCUTANEOUS
  Administered 2020-09-14: 1 [IU] via SUBCUTANEOUS
  Administered 2020-09-14: 2 [IU] via SUBCUTANEOUS

## 2020-08-15 MED ORDER — DOXAZOSIN MESYLATE 2 MG PO TABS: 2.0000 mg | ORAL_TABLET | Freq: Every day | ORAL | Status: DC

## 2020-08-15 MED ORDER — SODIUM CHLORIDE 0.9% FLUSH
10.0000 mL | INTRAVENOUS | Status: DC | PRN
  Administered 2020-08-20 – 2020-08-21 (×2): 30 mL
  Administered 2020-08-29 – 2020-09-15 (×3): 10 mL

## 2020-08-15 MED ORDER — PROSOURCE TF PO LIQD
45.0000 mL | Freq: Three times a day (TID) | ORAL | Status: DC
  Administered 2020-08-15 – 2020-09-10 (×75): 45 mL
  Filled 2020-08-15 (×72): qty 45

## 2020-08-15 MED ORDER — ALUM & MAG HYDROXIDE-SIMETH 200-200-20 MG/5ML PO SUSP: 30.0000 mL | ORAL | Status: DC | PRN

## 2020-08-15 MED ORDER — PROSOURCE TF PO LIQD: 45.0000 mL | Freq: Three times a day (TID) | ORAL | Status: DC

## 2020-08-15 MED ORDER — LOSARTAN POTASSIUM 25 MG PO TABS
25.0000 mg | ORAL_TABLET | Freq: Every day | ORAL | Status: DC
  Administered 2020-08-16 – 2020-09-15 (×31): 25 mg
  Filled 2020-08-15 (×31): qty 1

## 2020-08-15 MED ORDER — POTASSIUM CHLORIDE 20 MEQ PO PACK
40.0000 meq | PACK | Freq: Once | ORAL | Status: AC
  Administered 2020-08-15: 40 meq
  Filled 2020-08-15: qty 2

## 2020-08-15 MED ORDER — DOXAZOSIN MESYLATE 2 MG PO TABS
2.0000 mg | ORAL_TABLET | Freq: Every day | ORAL | Status: DC
  Administered 2020-08-16 – 2020-09-15 (×31): 2 mg
  Filled 2020-08-15 (×32): qty 1

## 2020-08-15 MED ORDER — POLYETHYLENE GLYCOL 3350 17 G PO PACK: 17.0000 g | PACK | Freq: Every day | ORAL | 0 refills | Status: DC

## 2020-08-15 MED ORDER — FREE WATER: 175.0000 mL | Status: DC

## 2020-08-15 MED ORDER — CHLORHEXIDINE GLUCONATE CLOTH 2 % EX PADS
6.0000 | MEDICATED_PAD | Freq: Every day | CUTANEOUS | Status: DC
  Administered 2020-08-15 – 2020-08-21 (×8): 6 via TOPICAL

## 2020-08-15 MED ORDER — LOSARTAN POTASSIUM 25 MG PO TABS: 25.0000 mg | ORAL_TABLET | Freq: Every day | ORAL | 3 refills | Status: DC

## 2020-08-15 MED ORDER — TRAZODONE HCL 50 MG PO TABS
25.0000 mg | ORAL_TABLET | Freq: Every evening | ORAL | Status: DC | PRN
  Administered 2020-08-26: 50 mg via NASOGASTRIC
  Filled 2020-08-15: qty 1

## 2020-08-15 MED ORDER — SODIUM CHLORIDE 0.9% FLUSH
10.0000 mL | Freq: Two times a day (BID) | INTRAVENOUS | Status: DC
  Administered 2020-08-15: 10 mL

## 2020-08-15 MED ORDER — POLYETHYLENE GLYCOL 3350 17 G PO PACK
17.0000 g | PACK | Freq: Every day | ORAL | Status: DC
  Administered 2020-08-17 – 2020-09-01 (×12): 17 g
  Filled 2020-08-15 (×13): qty 1

## 2020-08-15 MED ORDER — PROCHLORPERAZINE EDISYLATE 10 MG/2ML IJ SOLN: 5.0000 mg | Freq: Four times a day (QID) | INTRAMUSCULAR | Status: DC | PRN

## 2020-08-15 MED ORDER — AMANTADINE HCL 50 MG/5ML PO SOLN
100.0000 mg | Freq: Two times a day (BID) | ORAL | Status: DC
  Administered 2020-08-15 – 2020-08-24 (×18): 100 mg
  Filled 2020-08-15 (×20): qty 10

## 2020-08-15 MED ORDER — FLEET ENEMA 7-19 GM/118ML RE ENEM: 1.0000 | ENEMA | Freq: Once | RECTAL | Status: DC | PRN

## 2020-08-15 MED ORDER — GUAIFENESIN-DM 100-10 MG/5ML PO SYRP: 5.0000 mL | ORAL_SOLUTION | Freq: Four times a day (QID) | ORAL | Status: DC | PRN

## 2020-08-15 MED ORDER — METOPROLOL TARTRATE 25 MG/10 ML ORAL SUSPENSION
12.5000 mg | Freq: Four times a day (QID) | ORAL | Status: DC
  Administered 2020-08-15 – 2020-08-28 (×51): 12.5 mg
  Filled 2020-08-15 (×57): qty 5

## 2020-08-15 MED ORDER — DOFETILIDE 250 MCG PO CAPS
500.0000 ug | ORAL_CAPSULE | Freq: Two times a day (BID) | ORAL | Status: DC
  Administered 2020-08-15 – 2020-09-15 (×62): 500 ug
  Filled 2020-08-15 (×3): qty 2
  Filled 2020-08-15 (×2): qty 1
  Filled 2020-08-15 (×2): qty 2
  Filled 2020-08-15: qty 1
  Filled 2020-08-15: qty 2
  Filled 2020-08-15 (×3): qty 1
  Filled 2020-08-15: qty 2
  Filled 2020-08-15: qty 1
  Filled 2020-08-15: qty 2
  Filled 2020-08-15: qty 1
  Filled 2020-08-15 (×2): qty 2
  Filled 2020-08-15 (×3): qty 1
  Filled 2020-08-15 (×3): qty 2
  Filled 2020-08-15 (×3): qty 1
  Filled 2020-08-15 (×2): qty 2
  Filled 2020-08-15 (×2): qty 1
  Filled 2020-08-15: qty 2
  Filled 2020-08-15 (×2): qty 1
  Filled 2020-08-15: qty 2
  Filled 2020-08-15: qty 1
  Filled 2020-08-15: qty 2
  Filled 2020-08-15: qty 1
  Filled 2020-08-15 (×2): qty 2
  Filled 2020-08-15: qty 1
  Filled 2020-08-15 (×2): qty 2
  Filled 2020-08-15 (×3): qty 1
  Filled 2020-08-15: qty 2
  Filled 2020-08-15 (×5): qty 1
  Filled 2020-08-15 (×3): qty 2
  Filled 2020-08-15: qty 1
  Filled 2020-08-15 (×6): qty 2
  Filled 2020-08-15 (×2): qty 1
  Filled 2020-08-15: qty 2

## 2020-08-15 MED ORDER — PROCHLORPERAZINE MALEATE 5 MG PO TABS: 5.0000 mg | ORAL_TABLET | Freq: Four times a day (QID) | ORAL | Status: DC | PRN

## 2020-08-15 NOTE — Progress Notes (Addendum)
Inpatient Rehabilitation Medication Review by a Pharmacist  A complete drug regimen review was completed for this patient to identify any potential clinically significant medication issues.  Clinically significant medication issues were identified:  yes   Type of Medication Issue Identified Description of Issue Urgent (address now) Non-Urgent (address on AM team rounds) Plan Plan Accepted by Provider? (Yes / No / Pending AM Rounds)  Drug Interaction(s) (clinically significant)  Tikosyn and PRN prochlorperazine contraindicated per FDA packaging - prochlorperazine may reduce renal elimination of dofetilide by inhibiting therenal cation transport system, leading to Qtc prolongation Non-urgent Message PA - recommend discontinue prochlorperazine Pending response  Duplicate Therapy       Allergy       No Medication Administration End Date       Incorrect Dose       Additional Drug Therapy Needed  Discharge med list to resume but not ordered: - free water 175 ml q4h - clobetasol cream as needed for skin irritation Non-urgent Message PA - recommend continuing free water Pending response  Other  Neurology has recommended no anticoagulation for up to 10 days from the day of stroke (6/3).  Might need to reestablish anticoagulation after discussing with neurology (was on warfarin PTA).  Currently on aspirin and Lovenox.  F/u anticoagulation plan (day 10 from stroke is 6/13) NA    Provider Method of Notification: Secure chat to Reesa Chew, PA-C   For non-urgent medication issues to be resolved on team rounds tomorrow morning a [facility] Secure Chat Handoff was sent to: NA   Pharmacist comments:   Time spent performing this drug regimen review (minutes):  [place], PharmD PGY-1 Pharmacy Resident 08/15/2020 2:57 PM Please see [facility] for all pharmacy numbers

## 2020-08-15 NOTE — H&P (Signed)
Physical Medicine and Rehabilitation Admission H&P     CC: Stroke with functional deficits.      HPI: [name] [name]. [name] [name] is a 80 year old male with history of diastolic CHF, AVR/MVR, A fib/A flutter -on coumadin/Tiksoyn who was admitted on 07/27/20 with difficulty speaking and right sided weakness after sustaining a fall at church the day before. 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 He was intubated for airway protection and family elected on DNR with comfort care due to poor prognosis.  However, patient started showing ability to follow commands on 05/24 with repeat CT showing persistent bilateral SDH.  He was taken to the OR for right craniotomy for evacuation of SDH by Dr. Ronnald Ramp.  He tolerated extubation without difficulty but was limited by left greater than right-sided weakness with visual deficits, strong posterior bias as well as poor safety awareness with cognitive deficits.  He was admitted to rehab on 05/27 for intensive rehab program to consist of PT OT and speech therapy.   On 06/03 am, he was found to have decreased in [place] with left sided weakness and right inattention.  Code stroke activated and  CTA showed occluded R-ICA with exception of distal terminus and occlusion of proximal right M2 MCAts and perfusion study showed core infarct with hypoperfusion within R-MCA territory. with He underwent cerebral angio with revascularization of occluded R-ICA terminus. MRI brain done revealing confluent acute infarct throughout majority of R-MCA territory with some sparing of anterior insula and frontal operculum, spotty involvement right caudate improvement, petechial blood products and mass-effect with right-to-left 7.5 mm shift residual subdural blood and fluid on right as well as left parieto-occipital region.  Mentation was slowly improving with repeat CT showing 9 mm midline shift.     He was started on hypertonic saline for management of edema and core  track placed for nutritional support.  Pharmacy consulted to assist with monitoring electrolytes secondary to Tikosyn on board.  He tolerated extubation on 06/08 and mentation continue to fluctuate with bouts of lethargy.  He was kept n.p.o. and amantadine was added to help with activation.  Dr. 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 fib with interruption of AC due to recent SAH/SDH.  ASA added with recommendations to consider DOAC in 10 days.  Therapy has been ongoing and patient limited by lethargy, does not attempt to phonate or communicate, required hand overhand assist to take sips of water, has delayed in processing, left inattention with impulsivity and poor safety awareness.  CIR was recommended due to functional deficits.     Per pt's wife- pt is waking up a little in late afternoon/evening- Neuro thinks his days/nights mixed up. On Tfs 55cc/hour continuous Wife took off mittens, but requiring at all times, because pulling cortrak.      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     Other and unspecified hyperlipidemia     Pre-diabetes     Previous back surgery 1978, jan 2007   Psychosexual dysfunction with inhibited sexual excitement     SOB (shortness of breath)      "with heavy exercise"   Stroke ([place]) 08/2013    "I WAS IN AFIB AND THREW A CLOT, THE EFFECTS WERE TRANSITORY AND DIDNT LAST BUT FOR 30  MINUTES"   Thoracic aortic aneurysm [facility])             Past Surgical History:  Procedure Laterality Date   AORTIC AND MITRAL VALVE REPLACEMENT        09/2004   CARDIOVERSION        3 times from 2004-2006   CARDIOVERSION N/A 09/26/2013    Procedure: CARDIOVERSION;  Surgeon: Larey Dresser, MD;  Location: [place];  Service: Cardiovascular;  Laterality: N/A;   CARDIOVERSION N/A 06/19/2014    Procedure: CARDIOVERSION;  Surgeon: Jerline Pain, MD;  Location: [place];  Service: Cardiovascular;  Laterality: N/A;   CARDIOVERSION N/A 04/24/2017    Procedure: CARDIOVERSION;  Surgeon: Larey Dresser, MD;  Location: [place];  Service: Cardiovascular;  Laterality: N/A;   CARDIOVERSION N/A 06/08/2017    Procedure: CARDIOVERSION;  Surgeon: Pixie Casino, MD;  Location: [facility]COPY;  Service: Cardiovascular;  Laterality: N/A;   CARDIOVERSION N/A 08/24/2017    Procedure: CARDIOVERSION;  Surgeon: Skeet Latch, MD;  Location: [facility] ENDOSCOPY;  Service: Cardiovascular;  Laterality: N/A;   CARDIOVERSION N/A 02/14/2018    Procedure: CARDIOVERSION;  Surgeon: Larey Dresser, MD;  Location: [facility] ENDOSCOPY;  Service: Cardiovascular;  Laterality: N/A;   COLONOSCOPY       CRANIOTOMY N/A 07/28/2020    Procedure: CRANIOTOMY FOR EVACUATION OF SUBDURAL HEMATOMA;  Surgeon: Eustace Wessell, MD;  Location: [place];  Service: Neurosurgery;  Laterality: N/A;   IR ANGIO VERTEBRAL SEL SUBCLAVIAN INNOMINATE UNI R MOD SED   08/07/2020   IR CT HEAD LTD   08/07/2020   IR PERCUTANEOUS ART THROMBECTOMY/INFUSION INTRACRANIAL INC DIAG ANGIO   08/07/2020   laminectomies        10/1975 and in 03/2005   RADIOLOGY WITH ANESTHESIA N/A 08/07/2020    Procedure: IR WITH ANESTHESIA;  Surgeon: Luanne Bras, MD;  Location: [place];  Service: Radiology;  Laterality: N/A;   TONSILLECTOMY        1950   TOTAL HIP ARTHROPLASTY Right 04/03/2018    Procedure: TOTAL HIP ARTHROPLASTY ANTERIOR APPROACH;  Surgeon: Paralee Cancel, MD;   Location: [facility];  Service: Orthopedics;  Laterality: Right;  70 mins           Family History  Problem Relation Age of Onset   Macular degeneration Mother          macular degeneration   Cancer Father          intestinal/GI   Diabetes Paternal Aunt     Heart attack Maternal Grandfather     Diabetes Brother        Social History:  reports that he has never smoked. He has never used smokeless tobacco. He reports that he does not drink alcohol and does not use drugs.          Allergies  Allergen Reactions   Naproxen Hives   No  [facility] Food Allergies Other (See Comments)      Scallops - distress, nausea and vomitting            Medications Prior to Admission  Medication Sig Dispense Refill   atorvastatin (LIPITOR) 80 MG tablet TAKE 1 TABLET (80 MG TOTAL) BY MOUTH EVERY EVENING. (Patient taking differently: Take 80 mg by mouth daily.) 90 tablet 3   clobetasol cream (TEMOVATE) 1.91 % Apply 1 application topically daily as needed (for skin irritation).       Coenzyme Q10 (CO Q-10) 300 MG CAPS Take 300 mg by mouth daily.       Cyanocobalamin (VITAMIN B-12) 5000 MCG SUBL Place 5,000 mcg under the tongue daily at 2 PM.       docusate sodium (COLACE) 100 MG capsule Take 100 mg by mouth daily.       dofetilide (TIKOSYN) 500 MCG capsule Take 1 capsule (500 mcg total) by mouth 2 (two) times daily. 180 capsule 3   finasteride (PROSCAR) 5 MG tablet Take 5 mg by mouth daily.       furosemide (LASIX) 20 MG tablet Take 1 tablet (20 mg total) by mouth daily. 90 tablet 3   Glucosamine-Chondroitin (COSAMIN DS PO) Take 1 tablet by mouth 2 (two) times daily.       loratadine (CLARITIN) 10 MG tablet Take 10 mg by mouth daily as needed for allergies.       losartan (COZAAR) 25 MG tablet Take 1 tablet (25 mg total) by mouth daily. 90 tablet 3   metoprolol succinate (TOPROL-XL) 50 MG 24 hr tablet Take 75 mg (1.5 tabs) by mouth twice daily. Take with or immediately following a meal. (Patient taking  differently: Take 75 mg by mouth in the morning and at bedtime.) 90 tablet 3   Multiple Vitamins-Minerals (MULTIVITAL-M) TABS Take 1 tablet by mouth daily.       potassium chloride SA (KLOR-CON) 20 MEQ tablet Take 1 tablet (20 mEq total) by mouth daily. 90 tablet 3   Probiotic Product (PROBIOTIC FORMULA PO) Take 1 capsule by mouth daily.       tamsulosin (FLOMAX) 0.4 MG CAPS capsule Take 0.4 mg by mouth 2 (two) times daily.       warfarin (COUMADIN) 5 MG tablet As directed (Patient taking differently: Take 5 mg by mouth See admin instructions. 1 tablet everyday with an addition of 1 1/2 tablet on MWF and Saturday.) 120 tablet 1   COVID-19 mRNA vaccine, Moderna, 100 MCG/0.5ML injection Inject into the muscle. 0.25 mL 0      Drug Regimen Review  Drug regimen was reviewed and remains appropriate with no significant issues identified   Home: Home Living Family/patient expects to be discharged to::  ([facility]) Living Arrangements: Spouse/significant other Available Help at Discharge: Family, Available 24 hours/day Type of Home: House Home Access: Level entry Home Layout: One level Bathroom Shower/Tub: Multimedia programmer: Standard Additional Comments: Pt has a shower bench built-in to the side of the handheld shower head. x1 vertical grab bar at site of handheld shower head.  Lives With: Spouse   Functional History: Prior Function Level of Independence: Independent with assistive device(s) Comments: Prior to his admission 07/26/20, pt utilized a walking stick for ambulation on unlevel surfaces.  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[place] Status:  Mobility: Bed Mobility Overal bed mobility: Needs Assistance Bed Mobility: Supine to Sit Supine to sit: +2 for physical assistance, +2 for safety/equipment,  Total assist, HOB elevated Sit to supine: Mod assist, +2 for physical assistance, +2 for safety/equipment, HOB elevated General bed  mobility comments: Pt lethargic, minimally opening eyes and not initiating when supine, thus TAx2 for bed mobility. Transfers Overall transfer level: Needs assistance Equipment used: 2 person hand held assist Transfers: Sit to/from Stand, Stand Pivot Transfers Sit to Stand: Max assist, +2 physical assistance, +2 safety/equipment, From elevated surface Stand pivot transfers: Max assist, +2 physical assistance, +2 safety/equipment General transfer comment: x2 sit <> stand from elevated EOB with bil HHA, achieving full upright standing posture with maxAx2 at buttocks and intermittent bil knee block for safety. MaxAx2 to stand step to R with pt initiating small R foot steps but not L. Ambulation/Gait Ambulation/Gait assistance: Max assist, +2 physical assistance, +2 safety/equipment Gait Distance (Feet): 1 Feet Assistive device: 2 person hand held assist Gait Pattern/deviations: Step-to pattern, Decreased step length - left, Decreased stride length, Decreased weight shift to right, Decreased weight shift to left, Decreased dorsiflexion - right, Decreased dorsiflexion - left, Trunk flexed General Gait Details: Pt taking small staggering steps with R foot but not initiating L to side step to the R for transfer to the recliner with maxAx2 and bil HHA. Gait velocity: reduced Gait velocity interpretation: <1.31 ft/sec, indicative of household ambulator   ADL: ADL Overall ADL's : Needs assistance/impaired Eating/Feeding: NPO General ADL Comments: Pt require (A) for all adls at this time   Cognition: Cognition Overall Cognitive Status: Impaired/Different from baseline Arousal/Alertness: Lethargic Orientation Level: Disoriented X4 Attention: Sustained Focused Attention: Impaired Focused Attention Impairment: Functional basic, Verbal basic Sustained Attention: Impaired Sustained Attention Impairment: Verbal basic, Functional basic Memory:  (will continue to assess) Awareness: Impaired Awareness  Impairment: Intellectual impairment, Emergent impairment, Anticipatory impairment Problem Solving: Impaired Problem Solving Impairment: Functional basic Safety/Judgment: Impaired Cognition Arousal/Alertness: Lethargic Behavior During Therapy: Impulsive, Flat affect Overall Cognitive Status: Impaired/Different from baseline Area of Impairment: Following commands, Safety/judgement, Awareness, Problem solving, Attention, Memory Current Attention Level: Alternating Memory: Decreased short-term memory Following Commands: Follows one step commands inconsistently, Follows one step commands with increased time Safety/Judgement: Decreased awareness of safety, Decreased awareness of deficits Awareness: Intellectual Problem Solving: Slow processing, Decreased initiation, Difficulty sequencing, Requires verbal cues, Requires tactile cues General Comments: Pt very lethargic, minimally opening eyes briefly with stimulation in supine. Opened eyes and maintained them open when positioned in sitting. Pt needing increased time to process and follow single step multi-modal commands inconsistently, especially with his L side. Poor awareness into his deficits and safety as he tends to not attend to his L side and unable to cross midline with his eyes towards his L. Pt also impulsively reaching towards his cortrak as the session progressed, needing cues to avoid intermittently. Difficult to assess due to: Level of arousal     Blood pressure (!) (P) 143/76, pulse (P) 60, temperature (P) 98.3 F (36.8 C), temperature source (P) Oral, resp. rate (P) 18, height _0  (1.702 m), weight 91.8 kg, SpO2 (P) 96 %. Physical Exam Vitals and nursing note reviewed. Exam conducted with a chaperone present. Constitutional:      Comments: Lethargic and needed stimulation to get patient awake. Mittens on left hand and right was off with patient constantly rubbing incision on scalp v/s cortack Pt absolutely exhausted- lethargic-  woke briefly to tickling his feet, but went right back to sleep- wife at bedside, has mittens B/L , NAD  HENT:    Head: Normocephalic.    Comments: R crani site- staples  out- healing well; eyes closed; L facial droop noted    Right Ear: External ear normal.    Left Ear: External ear normal.    Nose: Nose normal.    Comments: Cortrak in place on R>L    Mouth/Throat:    Mouth: Mucous membranes are dry.    Pharynx: Oropharynx is clear. No oropharyngeal exudate. Eyes:    Comments: Kept eyes closed B/L  Cardiovascular:    Rate and Rhythm: Normal rate and regular rhythm.    Heart sounds: Normal heart sounds. No murmur heard.   No gallop. Pulmonary:    Comments: Few rhonchi noted; coarse; no Wheezes, rales; adequate air movement noted Abdominal:    Comments: .Soft, NT, ND, (+)BS      Genitourinary:    Comments: Condom catheter placed- medium amber urine noted Musculoskeletal:    Cervical back: Normal range of motion and neck supple.    Comments: Responded to tickling- B/L in feet Grabbed our hands on R, not well on L No other compliance with MS exam  Skin:    Comments: Some ecchymoses on arms B/L - no backside breakdown  Neurological:    Mental Status: He is lethargic.    Comments: Non-verbal except for grunting sounds. Left facial weakness with puffing breaths. Right in attention? Does move all four with mac tactile/verbal cues. Was completely asleep- woke very briefly- wearing mittens B/L  Psychiatric:    Comments: sedated      Lab Results Last 48 Hours        Results for orders placed or performed during the hospital encounter of 08/07/20 (from the past 48 hour(s))  Sodium     Status: Abnormal    Collection Time: 08/12/20  2:57 PM  Result Value Ref Range    Sodium 156 (H) 135 - 145 mmol/L      Comment: DELTA CHECK NOTED Performed at [facility] [address], [place], [place] [address]    Glucose, capillary     Status: Abnormal    Collection Time: 08/12/20   3:47 PM  Result Value Ref Range    Glucose-Capillary 150 (H) 70 - 99 mg/dL      Comment: Glucose reference range applies only to samples taken after fasting for at least 8 hours.  Glucose, capillary     Status: Abnormal    Collection Time: 08/12/20  8:01 PM  Result Value Ref Range    Glucose-Capillary 169 (H) 70 - 99 mg/dL      Comment: Glucose reference range applies only to samples taken after fasting for at least 8 hours.  Basic metabolic panel     Status: Abnormal    Collection Time: 08/12/20  9:54 PM  Result Value Ref Range    Sodium 152 (H) 135 - 145 mmol/L    Potassium 3.3 (L) 3.5 - 5.1 mmol/L    Chloride 122 (H) 98 - 111 mmol/L    CO2 26 22 - 32 mmol/L    Glucose, Bld 174 (H) 70 - 99 mg/dL      Comment: Glucose reference range applies only to samples taken after fasting for at least 8 hours.    BUN 33 (H) 8 - 23 mg/dL    Creatinine, Ser 0.73 0.61 - 1.24 mg/dL    Calcium 7.8 (L) 8.9 - 10.3 mg/dL    GFR, Estimated >60 >60 mL/min      Comment: (NOTE) Calculated using the CKD-EPI Creatinine Equation (2021)      Anion gap 4 (  L) 5 - 15      Comment: Performed at [facility], [place] [address] [place], [place] [address]  Magnesium     Status: None    Collection Time: 08/12/20  9:54 PM  Result Value Ref Range    Magnesium 2.3 1.7 - 2.4 mg/dL      Comment: Performed at [facility], [place] [address] [place], [place] [address]  Phosphorus     Status: Abnormal    Collection Time: 08/12/20  9:54 PM  Result Value Ref Range    Phosphorus 1.9 (L) 2.5 - 4.6 mg/dL      Comment: Performed at [facility] [address], [place], [place] [address]  Glucose, capillary     Status: Abnormal    Collection Time: 08/12/20 11:59 PM  Result Value Ref Range    Glucose-Capillary 154 (H) 70 - 99 mg/dL      Comment: Glucose reference range applies only to samples taken after fasting for at least 8 hours.  Glucose, capillary     Status: Abnormal    Collection Time: 08/13/20  4:01  AM  Result Value Ref Range    Glucose-Capillary 146 (H) 70 - 99 mg/dL      Comment: Glucose reference range applies only to samples taken after fasting for at least 8 hours.  Basic metabolic panel     Status: Abnormal    Collection Time: 08/13/20  5:13 AM  Result Value Ref Range    Sodium 150 (H) 135 - 145 mmol/L    Potassium 5.7 (H) 3.5 - 5.1 mmol/L    Chloride 122 (H) 98 - 111 mmol/L    CO2 26 22 - 32 mmol/L    Glucose, Bld 175 (H) 70 - 99 mg/dL      Comment: Glucose reference range applies only to samples taken after fasting for at least 8 hours.    BUN 35 (H) 8 - 23 mg/dL    Creatinine, Ser 0.69 0.61 - 1.24 mg/dL    Calcium 7.8 (L) 8.9 - 10.3 mg/dL    GFR, Estimated >60 >60 mL/min      Comment: (NOTE) Calculated using the CKD-EPI Creatinine Equation (2021)      Anion gap 2 (L) 5 - 15      Comment: Performed at [facility] [address], [place], [place] [address]  Vitamin B12     Status: Abnormal    Collection Time: 08/13/20  5:13 AM  Result Value Ref Range    Vitamin B-12 3,210 (H) 180 - 914 pg/mL      Comment: (NOTE) This assay is not validated for testing neonatal or myeloproliferative syndrome specimens for Vitamin B12 levels. Performed at [facility], [place] [address], [place], [place] [address]    Magnesium     Status: None    Collection Time: 08/13/20  5:13 AM  Result Value Ref Range    Magnesium 2.4 1.7 - 2.4 mg/dL      Comment: Performed at [facility] [address] [place], [place] [address]  Glucose, capillary     Status: Abnormal    Collection Time: 08/13/20  9:07 AM  Result Value Ref Range    Glucose-Capillary 158 (H) 70 - 99 mg/dL      Comment: Glucose reference range applies only to samples taken after fasting for at least 8 hours.    Comment 1 Notify RN      Comment 2  Document in Chart    Phosphorus     Status: Abnormal    Collection Time: 08/13/20 12:00 PM  Result Value Ref Range    Phosphorus 2.2 (L) 2.5 - 4.6 mg/dL       Comment: Performed at [facility] [address] [place], [place] [address]  Glucose, capillary     Status: Abnormal    Collection Time: 08/13/20 12:15 PM  Result Value Ref Range    Glucose-Capillary 158 (H) 70 - 99 mg/dL      Comment: Glucose reference range applies only to samples taken after fasting for at least 8 hours.    Comment 1 Notify RN      Comment 2 Document in Chart    Glucose, capillary     Status: Abnormal    Collection Time: 08/13/20  4:38 PM  Result Value Ref Range    Glucose-Capillary 164 (H) 70 - 99 mg/dL      Comment: Glucose reference range applies only to samples taken after fasting for at least 8 hours.    Comment 1 Notify RN      Comment 2 Document in Chart    Glucose, capillary     Status: Abnormal    Collection Time: 08/13/20  8:27 PM  Result Value Ref Range    Glucose-Capillary 141 (H) 70 - 99 mg/dL      Comment: Glucose reference range applies only to samples taken after fasting for at least 8 hours.  Glucose, capillary     Status: Abnormal    Collection Time: 08/14/20 12:54 AM  Result Value Ref Range    Glucose-Capillary 176 (H) 70 - 99 mg/dL      Comment: Glucose reference range applies only to samples taken after fasting for at least 8 hours.  Basic metabolic panel     Status: Abnormal    Collection Time: 08/14/20  4:14 AM  Result Value Ref Range    Sodium 150 (H) 135 - 145 mmol/L    Potassium 3.4 (L) 3.5 - 5.1 mmol/L    Chloride 118 (H) 98 - 111 mmol/L    CO2 26 22 - 32 mmol/L    Glucose, Bld 173 (H) 70 - 99 mg/dL      Comment: Glucose reference range applies only to samples taken after fasting for at least 8 hours.    BUN 37 (H) 8 - 23 mg/dL    Creatinine, Ser 0.69 0.61 - 1.24 mg/dL    Calcium 8.1 (L) 8.9 - 10.3 mg/dL    GFR, Estimated >60 >60 mL/min      Comment: (NOTE) Calculated using the CKD-EPI Creatinine Equation (2021)      Anion gap 6 5 - 15      Comment: Performed at [facility] [address], [place], [place]  [address]  Glucose, capillary     Status: Abnormal    Collection Time: 08/14/20  4:33 AM  Result Value Ref Range    Glucose-Capillary 170 (H) 70 - 99 mg/dL      Comment: Glucose reference range applies only to samples taken after fasting for at least 8 hours.  Glucose, capillary     Status: Abnormal    Collection Time: 08/14/20  8:07 AM  Result Value Ref Range    Glucose-Capillary 143 (H) 70 - 99 mg/dL      Comment: Glucose reference range applies only to samples taken after fasting for at least 8 hours.  Glucose, capillary  Status: Abnormal    Collection Time: 08/14/20 11:44 AM  Result Value Ref Range    Glucose-Capillary 148 (H) 70 - 99 mg/dL      Comment: Glucose reference range applies only to samples taken after fasting for at least 8 hours.      Imaging Results (Last 48 hours)  No results found.           Medical Problem List and Plan: 1.  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  Antithrombotics: -DVT/anticoagulation:  SQ Lovenox             -antiplatelet therapy: ASA 325 mg/day 3. Pain Management: tylenol prn 4. Mood: LCSW to follow for evaluation and support.              -antipsychotic agents: N/A 5. Neuropsych: This patient is not fully capable of making decisions on his own behalf. 6. Skin/Wound Care: Routine pressure relief measures.  7. Fluids/Electrolytes/Nutrition: NPO with tube feeds.             -water flushes 175 cc every 4 hours 8. HTN: Monitor BP tid with SBP goal <140/90.             --continue losartan/day, cardura/day and Metoprolol every 6 hours 9. T2DM: Hgb A1C- 6.2. Monitor BS evr 10. Lethargy: On Amantadine since 06/09 for activation. Might require Ritalin as well.  11. PAF: Monitor HR tid--question DOAC 10 days post stroke (On 06/13?). --SDH post fall 07/27/20              --continue Tikosyn bid             --Will consult pharmacy to help keep to keep K+>4.0/Mg levels supplemented. 12. Thoracic aortic aneurysm: Followed by [facility]. 13. Induced hypernatremia: Improving with increase water flushes 14. Hypokalemia: Question dilutional-- pharmacy assisting with management.                Bary Leriche, PA-C 08/14/2020    I have personally performed a face to face diagnostic evaluation of this patient and formulated the key components of the plan.  Additionally, I have personally reviewed laboratory data, imaging studies, as well as relevant notes and concur with the physician assistant's documentation above.

## 2020-08-15 NOTE — Plan of Care (Signed)
  Problem: Nutrition: Goal: Risk of aspiration will decrease Outcome: Progressing Goal: Dietary intake will improve Outcome: Progressing   Problem: Ischemic Stroke/TIA Tissue Perfusion: Goal: Complications of ischemic stroke/TIA will be minimized Outcome: Progressing

## 2020-08-15 NOTE — Discharge Summary (Signed)
Physician Discharge Summary  [name] ION:[id number] DOB: 03-13-1940 DOA: 08/07/2020  PCP: [name], MD  Admit date: 08/07/2020 Discharge date: 08/15/2020  Admitted From: Home  Discharge disposition: CIR  Recommendations for Outpatient Follow-Up:   Follow up with your primary care provider after discharge from rehabilitation. Check CBC, BMP, magnesium in the next couple of days. Neurology has recommended no anticoagulation for up to 10 days from the day of stroke.  Might need to reestablish anticoagulation after discussing with neurology.  Currently on aspirin and Lovenox.  Discharge Diagnosis:   Active Problems:   Hyperlipidemia with target LDL less than 130   Essential hypertension   Chronic a-fib (HCC)   Subdural hematoma (HCC)   CVA (cerebral vascular accident) ([place])   Middle cerebral artery embolism, right   Discharge Condition: Improved.  Diet recommendation: NPO.  Tube feeding to be continued  Wound care: None.  Code status: Full.   History of Present Illness:   [name] [name] is a 80 y.o. male with past medical history significant for atrial fibrillation on warfarin, history of aortic valve replacement, mitral valve replacement, acute rheumatic pericarditis (at age 38/7), stroke (2015, secondary to A. fib no residual) presented to hospital after he slumped over.  He suddenly became pale and lost consciousness and fell backwards hitting his head.  Patient [facility] with code stroke on 5/23 and was found to have large subdural hematoma.  He was also noted to have weakness of his left side and a CT scan showed hypodensity in the MCA area.  There was right-sided carotid occlusion.  Patient underwent revascularization procedure.  Patient was in the ICU for the same reason and subsequently was transferred out of the ICU.  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  Likely secondary to atrial fibrillation.  Patient had subdural hematoma so anticoagulation was not given which could have predisposed to atrial fibrillation.  Patient underwent thrombectomy for the same..  Latest 2D echocardiogram with LV ejection fraction of 60 to 65%, LDL of 56, hemoglobin A1c of 6.2.  Continue full dose aspirin for now.  Continue Lipitor.  As per neurology anticoagulation to be started to 10 days from the onset of stroke.  Dysphagia, aphasia, dysarthria due to stroke. Patient is currently on a cortrak tube tube.  Might need PEG tube placement if not improved.   Acute Respiratory Failure Patient was a intubated and recently extubated.  Continue supportive care.  [place] on room air   Essential (primary) hypertension Plan is to keep the blood pressure less than 140 long-term..  Continue losartan and metoprolol on discharge.   Heart failure, unspecified Compensated at this time.  Lasix is currently on hold.  Resume as necessary.   Hyperlipidemia, unspecified - Statin for goal LDL < 70.  Continue Lipitor 80 mg daily.   Paroxysmal atrial fibrillation Continue metoprolol and dofetilide.  Rate controlled  Thoracic aortic aneurysm Patient had a recent CT scan done in May.  Follows up at [facility].  Type 2 diabetes mellitus Goal A1c of less than 7.  Likely need continuation of sliding scale insulin  BPH Continue doxazosin.  Hypokalemia.  60 mEq of potassium given today.  Disposition.  At this time, patient is stable for disposition to CIR.  Medical Consultants:   Neurology PCCM  Procedures:    Status Post thrombectomy Subjective:  Today, patient was seen and examined at bedside.  Nonverbal.  Grunting and moaning only.  Patient's family at bedside.  As per the family patient is more alert today.  Discharge Exam:   Vitals:   08/15/20 0318 08/15/20 0728  BP: 136/68 (!) 145/70  Pulse: (!) 59 (!)  59  Resp: 20 20  Temp: 98.5 F (36.9 C) 98.3 F (36.8 C)  SpO2: 96% 99%   Vitals:   08/14/20 2300 08/15/20 0318 08/15/20 0500 08/15/20 0728  BP: 136/72 136/68  (!) 145/70  Pulse: 66 (!) 59  (!) 59  Resp: 20 20  20   Temp: 98.4 F (36.9 C) 98.5 F (36.9 C)  98.3 F (36.8 C)  TempSrc: Axillary   Oral  SpO2: 94% 96%  99%  Weight:   90.9 kg   Height:        General: Alert awake, not in obvious distress, nonverbal.  Grunting and moaning. HENT: pupils equally reacting to light,  No scleral pallor or icterus noted. Oral mucosa is moist.  Cortrak tube tube in place. Chest:  Clear breath sounds.  Diminished breath sounds bilaterally. No crackles or wheezes.  CVS: S1 &S2 heard. No murmur.  Regular rate and rhythm. Abdomen: Soft, nontender, nondistended.  Bowel sounds are heard.   Extremities: No cyanosis, clubbing or edema.  Peripheral pulses are palpable. Psych: Alert, awake  CNS: Moves left extremities.  Right-sided weakness. Skin: Warm and dry.  No rashes noted.  The results of significant diagnostics from this hospitalization (including imaging, microbiology, ancillary and laboratory) are listed below for reference.     Diagnostic Studies:   CT HEAD WO CONTRAST  Addendum Date: 08/07/2020   ADDENDUM REPORT: 08/07/2020 09:36 ADDENDUM: These results were called by telephone at the time of interpretation on 08/07/2020 at 9:36 am to provider Dr. Tessa Lerner, who verbally acknowledged these results. Electronically Signed   By: Kellie Simmering DO   On: 08/07/2020 09:36   Result Date: 08/07/2020 CLINICAL DATA:  Mental status change, unknown cause. Additional history provided: Patient nonverbal and not moving left side, last known normal 8 p.m. last night. EXAM: CT HEAD WITHOUT CONTRAST TECHNIQUE: Contiguous axial images were obtained from the base of the skull through the vertex without intravenous contrast. COMPARISON:  Prior head CT examinations 08/03/2020 and earlier. FINDINGS: Brain: New from the  prior examination of 08/03/2020, there is a large acute cortical/subcortical infarct within the right middle cerebral artery vascular territory, affecting the right frontal, parietal and temporal lobes as well as right insula and lateral right occipital lobe. Unchanged subdural hemorrhage overlying the right cerebral hemisphere. Continued interval decrease in conspicuity of interhemispheric subdural and subarachnoid hemorrhage. Unchanged size of an extra-axial hemorrhage overlying the left parietal lobe, again measuring up to 10 mm in thickness (remeasured on prior). Persistent trace intraventricular hemorrhage. No evidence of hydrocephalus at this time. Unchanged mass effect with 4 mm leftward midline shift measured at the level of the septum pellucidum. Redemonstrated chronic left parietal temporal infarct with ex vacuo dilatation of the posterior left lateral ventricle. Persistent although decreased pneumocephalus overlying the right cerebral hemisphere. Vascular: Asymmetric hyperdensity of the M1 and M2 right middle cerebral artery vessels, likely reflecting endoluminal thrombus. Atherosclerotic calcifications. Skull: Right-sided cranioplasty with overlying scalp staples. Sinuses/Orbits: Visualized orbits show no acute finding. Frothy secretions and moderate mucosal thickening within the right maxillary sinus. Trace bilateral ethmoid sinus mucosal thickening. IMPRESSION: 1. Large acute cortical/subcortical infarct within the right middle cerebral artery vascular territory, new from the prior  head CT of 08/03/2020. Asymmetric hyperdensity of the M1 and M2 right middle cerebral artery vessels, likely reflecting endoluminal thrombus. 2. Unchanged subdural hemorrhage overlying the right cerebral hemisphere. 3. Continued interval decrease in conspicuity of interhemispheric subdural and subarachnoid hemorrhage. 4. Unchanged size of an extra-axial hemorrhage overlying the left parietal lobe, again measuring up to 10 mm  in thickness. 5. Persistent trace intraventricular hemorrhage. No evidence of hydrocephalus. 6. Unchanged mass effect with 4 mm leftward midline shift. 7. Right maxillary sinusitis. Electronically Signed: By: Kellie Simmering DO On: 08/07/2020 09:32   MR ANGIO HEAD WO CONTRAST  Result Date: 08/07/2020 CLINICAL DATA:  Follow-up stroke.  Right MCA territory infarction EXAM: MRI HEAD WITHOUT CONTRAST MRA HEAD WITHOUT CONTRAST TECHNIQUE: Multiplanar, multi-echo pulse sequences of the brain and surrounding structures were acquired without intravenous contrast. Angiographic images of the Circle of Willis were acquired using MRA technique without intravenous contrast. COMPARISON: No pertinent prior exam. COMPARISON:  CT studies earlier today. FINDINGS: MRI HEAD FINDINGS Brain: Diffusion imaging shows confluent acute infarction within the right middle cerebral artery territory with some sparing of the anterior insula and frontal operculum and only spotty involvement the caudate and putamen. Mild swelling of the infarction with minimal petechial blood products but no frank hematoma. No focal abnormality affects the brainstem or cerebellum. There has been right parietal craniotomy for evacuation of a large right subdural hematoma. Small amount subdural blood does persist, maximal thickness in the frontal region 8 mm. Small amount of subdural blood also in the left parietal and occipital region, maximal thickness 1 cm without significant mass effect. Right-to-left midline shift presently of 7.5 mm. Mild chronic small-vessel ischemic changes of the white matter. Old infarction at the left parietooccipital junction with chronic volume loss. Small amount of hemorrhagic contusion in this region. Small amount of blood dependent within the occipital horns of the lateral ventricles, without evidence of obstructive hydrocephalus. Vascular: Major vessels at the base of the brain show flow. Skull and upper cervical spine: No primary  calvarial finding. Sinuses/Orbits: Rhinosinusitis of the right maxillary sinus. Orbits negative. Other: Left parietal scalp bruising. MRA HEAD FINDINGS Anterior circulation: Both internal carotid arteries widely patent through the skull base and siphon regions. The anterior and middle cerebral vessels are patent. No evidence of large or medium vessel occlusion in the right MCA territory. Probable luxury perfusion throughout the right MCA territory. Azygos anterior cerebral artery incidentally noted. Posterior circulation: Both vertebral arteries widely patent to the basilar. No basilar stenosis. Posterior circulation branch vessels are patent. Anatomic variants: None other significant. IMPRESSION: Confluent acute infarction throughout the majority of the right middle cerebral artery territory, with some sparing of the anterior insula and frontal operculum. Spotty involvement of the right caudate and putamen. Swelling and petechial blood products but no frank hematoma. Mass effect with right-to-left shift of 7.5 mm. Recent right parietal craniectomy for evacuation of subdural hematoma. Residual subdural blood and fluid on the right, maximal thickness 8 mm. Residual subdural blood in the left parieto-occipital region, maximal thickness 1 cm. Small amount of blood dependent in the occipital horns of the lateral ventricles without evidence of change in ventricular size. Small hemorrhagic contusion in the region of subacute infarction in the right parietooccipital junction. MR angiography negative for large or medium vessel occlusion presently. Luxury perfusion in the MCA branches. Electronically Signed   By: Nelson Chimes M.D.   On: 08/07/2020 16:40   MR BRAIN WO CONTRAST  Result Date: 08/07/2020 CLINICAL DATA:  Follow-up stroke.  Right MCA territory infarction EXAM: MRI HEAD WITHOUT CONTRAST MRA HEAD WITHOUT CONTRAST TECHNIQUE: Multiplanar, multi-echo pulse sequences of the brain and surrounding structures were  acquired without intravenous contrast. Angiographic images of the Circle of Willis were acquired using MRA technique without intravenous contrast. COMPARISON: No pertinent prior exam. COMPARISON:  CT studies earlier today. FINDINGS: MRI HEAD FINDINGS Brain: Diffusion imaging shows confluent acute infarction within the right middle cerebral artery territory with some sparing of the anterior insula and frontal operculum and only spotty involvement the caudate and putamen. Mild swelling of the infarction with minimal petechial blood products but no frank hematoma. No focal abnormality affects the brainstem or cerebellum. There has been right parietal craniotomy for evacuation of a large right subdural hematoma. Small amount subdural blood does persist, maximal thickness in the frontal region 8 mm. Small amount of subdural blood also in the left parietal and occipital region, maximal thickness 1 cm without significant mass effect. Right-to-left midline shift presently of 7.5 mm. Mild chronic small-vessel ischemic changes of the white matter. Old infarction at the left parietooccipital junction with chronic volume loss. Small amount of hemorrhagic contusion in this region. Small amount of blood dependent within the occipital horns of the lateral ventricles, without evidence of obstructive hydrocephalus. Vascular: Major vessels at the base of the brain show flow. Skull and upper cervical spine: No primary calvarial finding. Sinuses/Orbits: Rhinosinusitis of the right maxillary sinus. Orbits negative. Other: Left parietal scalp bruising. MRA HEAD FINDINGS Anterior circulation: Both internal carotid arteries widely patent through the skull base and siphon regions. The anterior and middle cerebral vessels are patent. No evidence of large or medium vessel occlusion in the right MCA territory. Probable luxury perfusion throughout the right MCA territory. Azygos anterior cerebral artery incidentally noted. Posterior  circulation: Both vertebral arteries widely patent to the basilar. No basilar stenosis. Posterior circulation branch vessels are patent. Anatomic variants: None other significant. IMPRESSION: Confluent acute infarction throughout the majority of the right middle cerebral artery territory, with some sparing of the anterior insula and frontal operculum. Spotty involvement of the right caudate and putamen. Swelling and petechial blood products but no frank hematoma. Mass effect with right-to-left shift of 7.5 mm. Recent right parietal craniectomy for evacuation of subdural hematoma. Residual subdural blood and fluid on the right, maximal thickness 8 mm. Residual subdural blood in the left parieto-occipital region, maximal thickness 1 cm. Small amount of blood dependent in the occipital horns of the lateral ventricles without evidence of change in ventricular size. Small hemorrhagic contusion in the region of subacute infarction in the right parietooccipital junction. MR angiography negative for large or medium vessel occlusion presently. Luxury perfusion in the MCA branches. Electronically Signed   By: Nelson Chimes M.D.   On: 08/07/2020 16:40   IR CT Head Ltd  Result Date: 08/11/2020 INDICATION: Decreased level of consciousness, left-sided weakness. 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 EXAM: 1. EMERGENT LARGE VESSEL OCCLUSION THROMBOLYSIS (anterior CIRCULATION) COMPARISON:  CT angiogram of the head and neck of August 07, 2020. MEDICATIONS: Ancef 2 g IV antibiotic was administered within 1 hour of the procedure. ANESTHESIA/SEDATION: General anesthesia. CONTRAST:  Omnipaque 300 approximately 88 mL FLUOROSCOPY TIME:  Fluoroscopy Time: 48 minutes 56 seconds (1957 mGy). COMPLICATIONS: None immediate. TECHNIQUE: Following a full explanation of the procedure along with the potential associated complications, an informed  witnessed consent was obtained. The risks of intracranial hemorrhage of 10%, worsening neurological deficit,  ventilator dependency, death and inability to revascularize were all reviewed in detail with the patient's wife. The patient was then put under general anesthesia by the Department of Anesthesiology at [facility]. The right groin was prepped and draped in the usual sterile fashion. Thereafter using modified Seldinger technique, transfemoral access into the right common femoral artery was obtained without difficulty. Over a 0.035 inch guidewire an 8 [place] Pinnacle 25 cm sheath was inserted. Through this, and also over a 0.035 inch guidewire a 5.5 [place] Berenstein support catheter inside of an 087 balloon guide catheter was advanced to the aortic arch, and selectively positioned in the right common carotid artery. The guidewire, and the support catheter removed. Good aspiration was obtained from the hub of balloon guide catheter. FINDINGS: An arteriogram performed through the right common carotid artery demonstrates the right external carotid artery and its major branches to be widely patent. 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 No evidence of a delayed string sign is seen angiographically. More distally no reconstitution was seen from the right-sided external carotid artery branches. ENDOVASCULAR TREATMENT Through the balloon guide catheter in the right internal carotid artery, a combination of an 021 160 cm microcatheter inside of a 71 Zoom aspiration catheter was advanced over a 0.014 inch standard Synchro micro guidewire. With a mild J configuration, the combination was navigated without difficulty to the cavernous segment of the right internal carotid artery followed by the Zoom aspiration catheter more proximally. The guidewire was removed. Good aspiration obtained from the hub of the microcatheter. A gentle control arteriogram performed  through microcatheter demonstrated complete angiographic occlusion of the supraclinoid right ICA in the terminus with multiple large filling defects noted more proximally. Over a 0.014 inch Synchro standard micro guidewire with a J configuration, access was obtained into the right middle cerebral artery and advanced into the inferior division followed by the microcatheter. This was followed by advancement of an 071 Zoom aspiration catheter into right internal carotid artery terminus. The micro guidewire was removed. Good aspiration obtained from the hub of the 021 microcatheter and the distal right inferior division right middle cerebral artery. A 6 mm x 40 mm Solitaire X retrieval device was then advanced distally into the microcatheter. The O ring on the delivery microcatheter was loosened. This was then deployed by slight forward gentle traction with the right hand was delivery micro guidewire, with the left hand unsheathing the retrieval device. With proximal flow arrest in the right internal carotid artery, and constant aspiration being applied at the hub of the balloon guide catheter, and with a Penumbra aspiration device at the hub of the 071 Zoom aspiration catheter, for approximately 2-1/2 minutes, the combination of the retrieval device, the Zoom aspiration catheter, and microcatheter were retrieved and removed. Prominent amount of clot was entangled in the retrieval device, and also the [employer]. A control arteriogram performed following reversal of proximal flow arrest demonstrated proximal cavernous segment, with filling defect involving the cavernous and supraclinoid segments with contrast and noted static in the supraclinoid segment. A second pass was then made again using the 071 Zoom aspiration catheter inside of which was an 021 Trevo ProVue microcatheter advanced over a 0.014 inch standard Synchro micro guidewire with a J configuration to the inferior division of the right middle cerebral artery  M2 region. The guidewire was removed. Good aspiration obtained from the hub of the microcatheter. A gentle control arteriogram performed through the microcatheter demonstrated safe position of  the tip of the microcatheter with good distal antegrade flow. This was then connected to continuous heparinized saline infusion. A 6.5 mm x 45 mm Embotrap retrieval device was then advanced to the distal end of the microcatheter. The O ring on the delivery microcatheter was loosened. With slight forward gentle traction with the right hand on the delivery micro guidewire, with the left hand the microcatheter was retrieved deploying the retrieval device. Again with proximal flow arrest in the right internal carotid artery, and with constant aspiration applied at the hub of the 071 Zoom aspiration catheter in the supraclinoid right ICA, and a 20 mL syringe at the hub of the balloon guide catheter for approximately 2 minutes, the combination of the retrieval device, the Zoom aspiration catheter, and the microcatheter were retrieved and removed. Copious amounts of clot were now seen within the retrieval device, and also in the canister. Following reversal of flow arrest, a control arteriogram performed through the balloon guide catheter in the right internal carotid artery now demonstrated complete revascularization of right internal carotid artery intracranially and extracranially. Complete revascularization of the right anterior in middle cerebral artery territories was established. A TICI 2c revascularization was noted. The balloon guide catheter was retrieved and removed. An 8 French Angio-Seal closure device applied for hemostasis at the right groin puncture site. Distal pulses remained present in both feet unchanged. Patient was intubated to protect airway. A CT of the brain demonstrated contrast stain in the right parietal cortical region associated with an adjacent subarachnoid contrast stain. No gross change was seen in  the convexity subdural collection. The patient was then transferred to the neuro ICU for post revascularization management. 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 PLAN: Follow-up in the clinic 4 weeks post discharge. Electronically Signed   By: Luanne Bras M.D.   On: 08/10/2020 11:39   Portable Chest x-ray  Result Date: 08/07/2020 CLINICAL DATA:  Status post ET tube placement. EXAM: PORTABLE CHEST 1 VIEW COMPARISON:  Jul 27, 2020. FINDINGS: Endotracheal tube tip is at the level of the clavicular heads. Gastric tube courses below the diaphragm with the tip outside the field of view. The side port is approximately above the gastroesophageal junction. Similar left basilar opacities. No visible pneumothorax or large pleural effusion on this single AP semi erect radiograph. Similar left basilar opacities. Cardiomediastinal silhouette is similar. 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 Aortic atherosclerosis. IMPRESSION: 1. Endotracheal tube tip is at the level of the clavicular heads. 2. Gastric tube courses below the diaphragm with the tip outside the field of view and the side port 3 cm above the gastroesophageal junction. Recommend advancement if intragastric position of the side port is desired. 3. Similar left basilar opacities, which could represent atelectasis or pneumonia. Electronically Signed   By: Margaretha Sheffield MD   On: 08/07/2020 13:02   DG Abd Portable 1V  Result Date: 08/07/2020 CLINICAL DATA:  Gastric tube placement EXAM: PORTABLE ABDOMEN - 1 VIEW COMPARISON:  None. FINDINGS: NG tube in the stomach with the tip in the  lateral body of the stomach. Normal bowel gas pattern. Contrast in the renal collecting system from proceeding angiography. Normal bowel gas pattern.  IMPRESSION: NG tube in the stomach. Electronically Signed   By: Franchot Gallo M.D.   On: 08/07/2020 14:10   ECHOCARDIOGRAM COMPLETE  Result Date: 08/07/2020    ECHOCARDIOGRAM REPORT   Patient Name:   Marlin TOD ABRAHAMSEN Date of Exam: 08/07/2020 Medical Rec #:  [id number]     Height:       68.0 in Accession #:    [id number]    Weight:       204.4 lb Date of Birth:  10-17-1940     BSA:          2.063 m Patient Age:    44 years      BP:           116/80 mmHg Patient Gender: M             HR:           70 bpm. Exam Location:  Inpatient Procedure: 2D Echo, Cardiac Doppler, Color Doppler and Intracardiac            Opacification Agent Indications:    Stroke  History:        Patient has prior history of Echocardiogram examinations, most                 recent 02/22/2018. 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 Hx rheumatic heart                 disease. S/p AVR. S/p MVR.  Sonographer:    Clayton Lefort RDCS (AE) Referring Phys: [id number] Lorenza Chick  Sonographer Comments: Suboptimal apical window. IMPRESSIONS  1. Left ventricular ejection fraction, by estimation, is 60 to 65%. The left ventricle has normal function. The left ventricle has no regional wall motion abnormalities. There is severe concentric left ventricular hypertrophy. Diastolic function indeterminant due to mitral valve repair.  2. Right ventricular systolic function is normal. The right ventricular size is normal.  3. Left atrial size was severely dilated.  4. Right atrial size was mildly dilated.  5. The mitral valve has been repaired/replaced. A bioprosthetic mitral valve is present. The valve appears well seated with normal function by doppler interrogation. Mean gradient 18mmHg at HR 67bpm. There is no mitral valve regurgitation.  6. The aortic valve has been  repaired/replaced. A bioprosthetic aortic valve is present and appears well seated with normal function by doppler interrogation. Mean gradient 63mmHg, peak gradient 62mmHg, Vmax 2.37, DI 0.4. Trace aortic regurgitation visualized.  7. Aortic dilatation noted. 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 Comparison(s): Compared to prior TTE in our system (2019), the aortic root now measures 43mm (previously 98mm) and the ascending aorta now measures 68mm (previously 37mm). Notably, the patient has had subsequent TTEs after 2019 at [facility] clinic. FINDINGS  Left Ventricle: Left ventricular ejection fraction, by estimation, is 60 to 65%. The left ventricle has normal function. The left ventricle has no regional wall motion abnormalities. Definity contrast agent was given IV to delineate the left ventricular  endocardial borders. The left ventricular internal cavity size was normal in size. There is severe concentric left ventricular hypertrophy. Diastolic function indeterminant due to mitral valve repair. Right Ventricle: The right ventricular size is normal. No increase in right ventricular wall thickness. Right ventricular systolic function is normal. Left Atrium: Left atrial size  was severely dilated. Right Atrium: Right atrial size was mildly dilated. Pericardium: There is no evidence of pericardial effusion. Mitral Valve: The mitral valve has been repaired/replaced. A bioprosthetic mitral valve is present. The valve appears well seated with normal function by doppler interrogation. Mean gradient 10mmHg at HR 67bpm. There is no mitral valve regurgitation. Mean  gradient 58mmHg at HR 67bpm. No mitral regurgitation mitral valve regurgitation. MV peak gradient, 12.0 mmHg. The mean mitral valve gradient is 4.5 mmHg. Tricuspid Valve: The tricuspid valve is normal in structure. Tricuspid valve regurgitation is  trivial. Aortic Valve: The aortic valve has been repaired/replaced. Aortic valve regurgitation is trivial. A bioprosthetic aortic valve is present and appears well seated with normal function by doppler interrogation.Aortic valve mean gradient measures 12.0 mmHg.  Aortic valve peak gradient measures 21.0 mmHg. Aortic valve area, by VTI measures 1.67 cm. Pulmonic Valve: The pulmonic valve was normal in structure. Pulmonic valve regurgitation is trivial. Aorta: Aortic dilatation noted. 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 Venous: IVC assessment for right atrial pressure unable to be performed due to mechanical ventilation. IAS/Shunts: No atrial level shunt detected by color flow Doppler.  LEFT VENTRICLE PLAX 2D LVIDd:         3.90 cm LVIDs:         2.70 cm LV PW:         1.80 cm LV IVS:        2.00 cm LVOT diam:     2.40 cm LV SV:         76 LV SV Index:   37 LVOT Area:     4.52 cm  RIGHT VENTRICLE             IVC RV Basal diam:  3.40 cm     IVC diam: 2.00 cm RV S prime:     12.50 cm/s TAPSE (M-mode): 1.5 cm LEFT ATRIUM            Index       RIGHT ATRIUM           Index LA diam:      5.00 cm  2.42 cm/m  RA Area:     22.30 cm LA Vol (A2C): 88.8 ml  43.05 ml/m RA Volume:   68.00 ml  32.97 ml/m LA Vol (A4C): 126.0 ml 61.09 ml/m  AORTIC VALVE AV Area (Vmax):    1.83 cm AV Area (Vmean):   1.80 cm AV Area (VTI):     1.67 cm AV Vmax:           229.25 cm/s AV Vmean:          162.250 cm/s AV VTI:            0.453 m AV Peak Grad:      21.0 mmHg AV Mean Grad:      12.0 mmHg LVOT Vmax:         92.77 cm/s LVOT Vmean:        64.667 cm/s LVOT VTI:          0.168 m LVOT/AV VTI ratio: 0.37  AORTA Ao Root diam: 4.90 cm Ao Asc diam:  4.90 cm MITRAL VALVE            TRICUSPID VALVE MV Area VTI:  1.64 cm  TR Peak grad:   15.2 mmHg MV Peak grad: 12.0 mmHg TR Vmax:  195.00 cm/s MV Mean grad: 4.5 mmHg MV  Vmax:      1.74 m/s  SHUNTS MV Vmean:     97.3 cm/s Systemic VTI:  0.17 m                         Systemic Diam: 2.40 cm Gwyndolyn Kaufman MD Electronically signed by Gwyndolyn Kaufman MD Signature Date/Time: 08/07/2020/6:59:58 PM    Final    IR PERCUTANEOUS ART THROMBECTOMY/INFUSION INTRACRANIAL INC DIAG ANGIO  Result Date: 08/11/2020 INDICATION: Decreased level of consciousness, left-sided weakness. 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 EXAM: 1. EMERGENT LARGE VESSEL OCCLUSION THROMBOLYSIS (anterior CIRCULATION) COMPARISON:  CT angiogram of the head and neck of August 07, 2020. MEDICATIONS: Ancef 2 g IV antibiotic was administered within 1 hour of the procedure. ANESTHESIA/SEDATION: General anesthesia. CONTRAST:  Omnipaque 300 approximately 88 mL FLUOROSCOPY TIME:  Fluoroscopy Time: 48 minutes 56 seconds (1957 mGy). COMPLICATIONS: None immediate. TECHNIQUE: Following a full explanation of the procedure along with the potential associated complications, an informed witnessed consent was obtained. The risks of intracranial hemorrhage of 10%, worsening neurological deficit, ventilator dependency, death and inability to revascularize were all reviewed in detail with the patient's wife. The patient was then put under general anesthesia by the Department of Anesthesiology at [facility]. The right groin was prepped and draped in the usual sterile fashion. Thereafter using modified Seldinger technique, transfemoral access into the right common femoral artery was obtained without difficulty. Over a 0.035 inch guidewire an 8 [place] Pinnacle 25 cm sheath was inserted. Through this, and also over a 0.035 inch guidewire a 5.5 [place] Berenstein support catheter inside of an 087 balloon guide catheter was advanced to the aortic arch, and selectively positioned in the right common carotid artery. The guidewire, and the  support catheter removed. Good aspiration was obtained from the hub of balloon guide catheter. FINDINGS: An arteriogram performed through the right common carotid artery demonstrates the right external carotid artery and its major branches to be widely patent. 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 No evidence of a delayed string sign is seen angiographically. More distally no reconstitution was seen from the right-sided external carotid artery branches. ENDOVASCULAR TREATMENT Through the balloon guide catheter in the right internal carotid artery, a combination of an 021 160 cm microcatheter inside of a 71 Zoom aspiration catheter was advanced over a 0.014 inch standard Synchro micro guidewire. With a mild J configuration, the combination was navigated without difficulty to the cavernous segment of the right internal carotid artery followed by the Zoom aspiration catheter more proximally. The guidewire was removed. Good aspiration obtained from the hub of the microcatheter. 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 Over a 0.014 inch Synchro standard micro guidewire with a J configuration, access was obtained into the right middle cerebral artery and advanced into the inferior division followed by the microcatheter. This was followed by advancement of an 071 Zoom aspiration catheter into right internal carotid artery terminus. The micro guidewire was removed. Good aspiration obtained from the hub of the 021 microcatheter and the distal right inferior division right middle cerebral artery. A 6 mm x 40 mm Solitaire X retrieval device was then advanced distally into the microcatheter. The O ring on the  delivery microcatheter was loosened. This was then deployed by slight forward gentle traction with  the right hand was delivery micro guidewire, with the left hand unsheathing the retrieval device. With proximal flow arrest in the right internal carotid artery, and constant aspiration being applied at the hub of the balloon guide catheter, and with a Penumbra aspiration device at the hub of the 071 Zoom aspiration catheter, for approximately 2-1/2 minutes, the combination of the retrieval device, the Zoom aspiration catheter, and microcatheter were retrieved and removed. Prominent amount of clot was entangled in the retrieval device, and also the [employer]. A control arteriogram performed following reversal of proximal flow arrest demonstrated proximal cavernous segment, with filling defect involving the cavernous and supraclinoid segments with contrast and noted static in the supraclinoid segment. A second pass was then made again using the 071 Zoom aspiration catheter inside of which was an 021 Trevo ProVue microcatheter advanced over a 0.014 inch standard Synchro micro guidewire with a J configuration to the inferior division of the right middle cerebral artery M2 region. The guidewire was removed. Good aspiration obtained from the hub of the microcatheter. A gentle control arteriogram performed through the microcatheter demonstrated safe position of the tip of the microcatheter with good distal antegrade flow. This was then connected to continuous heparinized saline infusion. A 6.5 mm x 45 mm Embotrap retrieval device was then advanced to the distal end of the microcatheter. The O ring on the delivery microcatheter was loosened. With slight forward gentle traction with the right hand on the delivery micro guidewire, with the left hand the microcatheter was retrieved deploying the retrieval device. Again with proximal flow arrest in the right internal carotid artery, and with constant aspiration applied at the hub of the 071 Zoom aspiration catheter in the supraclinoid right ICA, and a 20 mL syringe at the hub  of the balloon guide catheter for approximately 2 minutes, the combination of the retrieval device, the Zoom aspiration catheter, and the microcatheter were retrieved and removed. Copious amounts of clot were now seen within the retrieval device, and also in the canister. Following reversal of flow arrest, a control arteriogram performed through the balloon guide catheter in the right internal carotid artery now demonstrated complete revascularization of right internal carotid artery intracranially and extracranially. Complete revascularization of the right anterior in middle cerebral artery territories was established. A TICI 2c revascularization was noted. The balloon guide catheter was retrieved and removed. An 8 French Angio-Seal closure device applied for hemostasis at the right groin puncture site. Distal pulses remained present in both feet unchanged. Patient was intubated to protect airway. A CT of the brain demonstrated contrast stain in the right parietal cortical region associated with an adjacent subarachnoid contrast stain. No gross change was seen in the convexity subdural collection. The patient was then transferred to the neuro ICU for post revascularization management. 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 PLAN: Follow-up in the clinic 4 weeks post discharge. Electronically Signed   By: Luanne Bras M.D.   On: 08/10/2020 11:39   VAS [place] LOWER EXTREMITY VENOUS (DVT)  Result Date: 08/09/2020  Lower Venous DVT Study Patient Name:  Garlan JAYVIEN ROWLETTE  Date of Exam:   08/08/2020 Medical Rec #: [id number]      Accession #:    [id number] Date of  Birth: [birth year]      Patient Gender: M Patient Age:   79Y Exam Location:  [facility] Procedure:      VAS [place] LOWER EXTREMITY VENOUS (DVT) Referring Phys: [id number] [place] --------------------------------------------------------------------------------  Indications: Stroke.  Comparison Study: No prior Performing Technologist: Oda Cogan RDMS, RVT  Examination Guidelines: A complete evaluation includes B-mode imaging, spectral Doppler, color Doppler, and power Doppler as needed of all accessible portions of each vessel. Bilateral testing is considered an integral part of a complete examination. Limited examinations for reoccurring indications may be performed as noted. The reflux portion of the exam is performed with the patient in reverse Trendelenburg.  +---------+---------------+---------+-----------+----------+--------------+ RIGHT    CompressibilityPhasicitySpontaneityPropertiesThrombus Aging +---------+---------------+---------+-----------+----------+--------------+ CFV      Full           Yes      Yes                                 +---------+---------------+---------+-----------+----------+--------------+ SFJ      Full                                                        +---------+---------------+---------+-----------+----------+--------------+ FV Prox  Full                                                        +---------+---------------+---------+-----------+----------+--------------+ FV Mid   Full                                                        +---------+---------------+---------+-----------+----------+--------------+ FV DistalFull                                                        +---------+---------------+---------+-----------+----------+--------------+ PFV      Full                                                        +---------+---------------+---------+-----------+----------+--------------+ POP      Full            Yes      Yes                                 +---------+---------------+---------+-----------+----------+--------------+ PTV      Full                                                        +---------+---------------+---------+-----------+----------+--------------+  PERO     Full                                                        +---------+---------------+---------+-----------+----------+--------------+   +---------+---------------+---------+-----------+----------+--------------+ LEFT     CompressibilityPhasicitySpontaneityPropertiesThrombus Aging +---------+---------------+---------+-----------+----------+--------------+ CFV      Full           Yes      Yes                                 +---------+---------------+---------+-----------+----------+--------------+ SFJ      Full                                                        +---------+---------------+---------+-----------+----------+--------------+ FV Prox  Full                                                        +---------+---------------+---------+-----------+----------+--------------+ FV Mid   Full                                                        +---------+---------------+---------+-----------+----------+--------------+ FV DistalFull                                                        +---------+---------------+---------+-----------+----------+--------------+ PFV      Full                                                        +---------+---------------+---------+-----------+----------+--------------+ POP      Full           Yes      Yes                                 +---------+---------------+---------+-----------+----------+--------------+ PTV      Full                                                        +---------+---------------+---------+-----------+----------+--------------+ PERO     Full                                                         +---------+---------------+---------+-----------+----------+--------------+  Summary: BILATERAL: - No evidence of deep vein thrombosis seen in the lower extremities, bilaterally. -No evidence of popliteal cyst, bilaterally.   *See table(s) above for measurements and observations. Electronically signed by Ruta Hinds MD on 08/09/2020 at 5:11:11 PM.    Final    CT ANGIO HEAD NECK W WO CM W PERF (CODE STROKE)  Addendum Date: 08/07/2020   ADDENDUM REPORT: 08/07/2020 10:13 ADDENDUM: These results were called by telephone at the time of interpretation on 08/07/2020 at 10:05 am to provider [facility] , who verbally acknowledged these results. Electronically Signed   By: Kellie Simmering DO   On: 08/07/2020 10:13   Result Date: 08/07/2020 CLINICAL DATA:  Mental status change, unknown cause. EXAM: CT ANGIOGRAPHY HEAD AND NECK CT PERFUSION BRAIN TECHNIQUE: Multidetector CT imaging of the head and neck was performed using the standard protocol during bolus administration of intravenous contrast. Multiplanar CT image reconstructions and MIPs were obtained to evaluate the vascular anatomy. Carotid stenosis measurements (when applicable) are obtained utilizing NASCET criteria, using the distal internal carotid diameter as the denominator. Multiphase CT imaging of the brain was performed following IV bolus contrast injection. Subsequent parametric perfusion maps were calculated using RAPID software. CONTRAST:  189mL OMNIPAQUE IOHEXOL 350 MG/ML SOLN COMPARISON:  Noncontrast head CT examinations 08/07/2020 and earlier. MRA neck 08/06/2013, MRA head 08/06/2013. FINDINGS: CTA NECK FINDINGS Aortic arch: The visualized ascending thoracic aorta measures up to 4 mm in diameter. Standard aortic branching. Atherosclerotic plaque within the visualized aortic arch and proximal major branch vessels of the neck. No hemodynamically significant innominate or proximal subclavian artery stenosis. Right carotid system: The CCA is patent to  the bifurcation. Mild to moderate calcified plaque within the distal CCA and carotid bifurcation. The ICA becomes occluded shortly beyond its origin and remains occluded throughout the remainder of the neck. Left carotid system: CCA and ICA patent within the neck without stenosis. Minimal calcified plaque within the carotid bifurcation. Tortuosity of the distal cervical ICA. Vertebral arteries: Vertebral arteries patent within the neck. Nonstenotic calcified plaque at the origin of the right vertebral artery. Calcified plaque results in moderate stenosis at the origin of the left vertebral artery. Skeleton: Cervical spondylosis. No acute bony abnormality or aggressive osseous lesion. Other neck: No neck mass or cervical lymphadenopathy. Thyroid unremarkable. Upper chest: No consolidation within the imaged lung apices. Prior median sternotomy. Review of the MIP images confirms the above findings CTA HEAD FINDINGS Anterior circulation: The right ICA remains occluded intracranially with the exception of the very distal right ICA terminus. The M1 middle cerebral artery is patent, likely due to cross flow via the anterior communicating artery and right A1 segment. However, there is occlusion of a proximal M2 right MCA vessel (series 11, image 20) (series 12, image 19). The intracranial left ICA is patent. Mild calcified plaque within this vessel with no more than mild stenosis. The M1 left middle cerebral artery is patent. No left M2 proximal branch occlusion or proximal high-grade arterial stenosis. The anterior cerebral arteries are patent. Strongly dominant right anterior cerebral artery which supplies the bilateral ACA territories distally. No intracranial aneurysm is identified. Posterior circulation: The intracranial vertebral arteries are patent. The basilar artery is patent. Apparent flow gaps within a right PCA P3/P4 branch, which may reflect short segment occlusions or severe stenoses. The left PCA is patent.  Posterior communicating arteries are hypoplastic or absent bilaterally. Venous sinuses: The dural venous sinuses are poorly assessed due to contrast timing. Anatomic variants: As described Review  of the MIP images confirms the above findings CT Brain Perfusion Findings: Please note the reliability of the perfusion data is limited due to bolus quality and motion degradation. CBF (<30%) Volume: 7mL Perfusion (Tmax>6.0s) volume: 167mL (predominantly within the right MCA vascular territory). Mismatch Volume: 1107mL Infarction Location:Right MCA vascular territory These results were called by telephone at the time of interpretation on 08/07/2020 at 9:37 am to provider Dr. Tessa Lerner, who verbally acknowledged these results. IMPRESSION: CTA neck: 1. The right internal carotid artery becomes occluded shortly beyond its origin and remains occluded throughout the remainder of the neck. 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 2. The left common carotid and internal carotid arteries are patent within the neck without stenosis. Mild calcified plaque within the left carotid bifurcation. 3. Vertebral arteries patent within the neck. Moderate atherosclerotic narrowing at the origin of the left vertebral artery. 4. The partially imaged ascending thoracic aorta measures up to 4 cm in diameter. Nonemergent CTA of the chest is recommended to more fully assess the degree of acid ending thoracic aortic dilation, and to determine appropriate follow-up. CTA head: 1. The intracranial right internal carotid artery is occluded with the exception of the very distal right ICA terminus. 2. The M1 right middle cerebral artery is patent, likely due to cross flow via the anterior communicating artery and right A1 segment. However, there is occlusion of a proximal right M2 MCA vessel. 3. Apparent flow gaps within a right PCA P3/P4 branch, which may reflect short-segment occlusions or high-grade stenoses. CT perfusion head: 1. Please  note the reliability of the perfusion data is limited due to bolus quality and motion degradation. 2. The perfusion software identifies a 45 mL core infarct within the right MCA vascular territory. However, the size of the core infarct is likely underestimated when correlating with the concurrently performed noncontrast head CT. 3. The perfusion software identifies a 162 mL region of hypoperfusion predominantly within the right MCA vascular territory. Reported mismatch volume: 117 mL. Electronically Signed: By: Kellie Simmering DO On: 08/07/2020 10:03   IR ANGIO VERTEBRAL SEL SUBCLAVIAN INNOMINATE UNI R MOD SED  Result Date: 08/11/2020 INDICATION: Decreased level of consciousness, left-sided weakness. 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 EXAM: 1. EMERGENT LARGE VESSEL OCCLUSION THROMBOLYSIS (anterior CIRCULATION) COMPARISON:  CT angiogram of the head and neck of August 07, 2020. MEDICATIONS: Ancef 2 g IV antibiotic was administered within 1 hour of the procedure. ANESTHESIA/SEDATION: General anesthesia. CONTRAST:  Omnipaque 300 approximately 88 mL FLUOROSCOPY TIME:  Fluoroscopy Time: 48 minutes 56 seconds (1957 mGy). COMPLICATIONS: None immediate. TECHNIQUE: Following a full explanation of the procedure along with the potential associated complications, an informed witnessed consent was obtained. The risks of intracranial hemorrhage of 10%, worsening neurological deficit, ventilator dependency, death and inability to revascularize were all reviewed in detail with the patient's wife. The patient was then put under general anesthesia by the Department of Anesthesiology at [facility]. The right groin was prepped and draped in the usual sterile fashion. Thereafter using modified Seldinger technique, transfemoral access into the right common femoral artery was obtained without difficulty. Over a 0.035 inch  guidewire an 8 [place] Pinnacle 25 cm sheath was inserted. Through this, and also over a 0.035 inch guidewire a 5.5 [place] Berenstein support catheter inside of an 087 balloon guide catheter was advanced to the aortic arch, and selectively positioned in the right common carotid artery. The guidewire,  and the support catheter removed. Good aspiration was obtained from the hub of balloon guide catheter. FINDINGS: An arteriogram performed through the right common carotid artery demonstrates the right external carotid artery and its major branches to be widely patent. 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 No evidence of a delayed string sign is seen angiographically. More distally no reconstitution was seen from the right-sided external carotid artery branches. ENDOVASCULAR TREATMENT Through the balloon guide catheter in the right internal carotid artery, a combination of an 021 160 cm microcatheter inside of a 71 Zoom aspiration catheter was advanced over a 0.014 inch standard Synchro micro guidewire. With a mild J configuration, the combination was navigated without difficulty to the cavernous segment of the right internal carotid artery followed by the Zoom aspiration catheter more proximally. The guidewire was removed. Good aspiration obtained from the hub of the microcatheter. 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 Over a 0.014 inch Synchro standard micro guidewire with a J configuration, access was obtained into the right middle cerebral artery and advanced into the inferior division followed by the microcatheter. This was followed by advancement of an 071 Zoom aspiration catheter into right internal carotid artery terminus. The micro guidewire was removed. Good aspiration obtained from the  hub of the 021 microcatheter and the distal right inferior division right middle cerebral artery. A 6 mm x 40 mm Solitaire X retrieval device was then advanced distally into the microcatheter. The O ring on the delivery microcatheter was loosened. This was then deployed by slight forward gentle traction with the right hand was delivery micro guidewire, with the left hand unsheathing the retrieval device. With proximal flow arrest in the right internal carotid artery, and constant aspiration being applied at the hub of the balloon guide catheter, and with a Penumbra aspiration device at the hub of the 071 Zoom aspiration catheter, for approximately 2-1/2 minutes, the combination of the retrieval device, the Zoom aspiration catheter, and microcatheter were retrieved and removed. Prominent amount of clot was entangled in the retrieval device, and also the SYSCO. A control arteriogram performed following reversal of proximal flow arrest demonstrated proximal cavernous segment, with filling defect involving the cavernous and supraclinoid segments with contrast and noted static in the supraclinoid segment. A second pass was then made again using the 071 Zoom aspiration catheter inside of which was an 021 Trevo ProVue microcatheter advanced over a 0.014 inch standard Synchro micro guidewire with a J configuration to the inferior division of the right middle cerebral artery M2 region. The guidewire was removed. Good aspiration obtained from the hub of the microcatheter. A gentle control arteriogram performed through the microcatheter demonstrated safe position of the tip of the microcatheter with good distal antegrade flow. This was then connected to continuous heparinized saline infusion. A 6.5 mm x 45 mm Embotrap retrieval device was then advanced to the distal end of the microcatheter. The O ring on the delivery microcatheter was loosened. With slight forward gentle traction with the right hand on the delivery micro  guidewire, with the left hand the microcatheter was retrieved deploying the retrieval device. Again with proximal flow arrest in the right internal carotid artery, and with constant aspiration applied at the hub of the 071 Zoom aspiration catheter in the supraclinoid right ICA, and a 20 mL syringe at the hub of the balloon guide  catheter for approximately 2 minutes, the combination of the retrieval device, the Zoom aspiration catheter, and the microcatheter were retrieved and removed. Copious amounts of clot were now seen within the retrieval device, and also in the canister. Following reversal of flow arrest, a control arteriogram performed through the balloon guide catheter in the right internal carotid artery now demonstrated complete revascularization of right internal carotid artery intracranially and extracranially. Complete revascularization of the right anterior in middle cerebral artery territories was established. A TICI 2c revascularization was noted. The balloon guide catheter was retrieved and removed. An 8 French Angio-Seal closure device applied for hemostasis at the right groin puncture site. Distal pulses remained present in both feet unchanged. Patient was intubated to protect airway. A CT of the brain demonstrated contrast stain in the right parietal cortical region associated with an adjacent subarachnoid contrast stain. No gross change was seen in the convexity subdural collection. The patient was then transferred to the neuro ICU for post revascularization management. 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 PLAN: Follow-up in the clinic 4  weeks post discharge. Electronically Signed   By: Luanne Bras M.D.   On: 08/10/2020 11:39     Labs:   Basic Metabolic Panel: Recent Labs  Lab 08/08/20 1705 08/08/20 1705 08/09/20 0451 08/09/20 1039 08/09/20 1611 08/09/20 2158 08/10/20 0553 08/10/20 0905 08/12/20 1457 08/12/20 2154 08/13/20 0513 08/13/20 1200 08/14/20 0414 08/15/20 0112 08/15/20 0212  NA  --   --  135   < > 140   < > 142   < > 156* 152* 150*  --  150* 146*  --   K  --    < > 3.7  --   --   --  3.9  --   --  3.3* 5.7*  --  3.4* 3.6  --   CL  --    < > 106  --   --   --  115*  --   --  122* 122*  --  118* 114*  --   CO2  --    < > 24  --   --   --  23  --   --  26 26  --  26 25  --   GLUCOSE  --    < > 144*  --   --   --  166*  --   --  174* 175*  --  173* 158*  --   BUN  --    < > 20  --   --   --  21  --   --  33* 35*  --  37* 34*  --   CREATININE  --    < > 0.81  --   --   --  0.69  --   --  0.73 0.69  --  0.69 0.74  --   CALCIUM  --    < > 8.1*  --   --   --  8.0*  --   --  7.8* 7.8*  --  8.1* 8.2*  --   MG 1.9  --  2.1  --  2.0  --  2.2  --   --  2.3 2.4  --   --   --  2.1  PHOS 3.2  --  2.4*  --  2.7  --   --   --   --  1.9*  --  2.2*  --   --   --    < > = values in this interval not displayed.   GFR Estimated Creatinine Clearance: 83.1 mL/min (by C-G formula based on SCr of 0.74 mg/dL). Liver Function Tests: No results for input(s): AST, ALT, ALKPHOS, BILITOT, PROT, ALBUMIN in the last 168 hours. No results for input(s): LIPASE, AMYLASE in the last 168 hours. No results for input(s): AMMONIA in the last 168 hours. Coagulation profile No results for input(s): INR, PROTIME in the last 168 hours.  CBC: Recent Labs  Lab 08/09/20 0451  WBC 16.4*  NEUTROABS 12.6*  HGB 11.6*  HCT 34.6*  MCV 93.5  PLT 229   Cardiac Enzymes: No results for input(s): CKTOTAL, CKMB, CKMBINDEX, TROPONINI in the last 168 hours. BNP: Invalid input(s): POCBNP CBG: Recent Labs  Lab 08/14/20 1531  08/14/20 1941 08/14/20 2348 08/15/20 0329 08/15/20 0728  GLUCAP 112* 151* 141* 143* 178*   D-Dimer No results for input(s): DDIMER in the last 72 hours. Hgb A1c No results for input(s): HGBA1C in the last 72 hours. Lipid Profile No results for input(s): CHOL, HDL, LDLCALC, TRIG, CHOLHDL, LDLDIRECT in the last 72 hours. Thyroid function studies No results for input(s): TSH, T4TOTAL, T3FREE, THYROIDAB in the last 72 hours.  Invalid input(s): FREET3 Anemia work up Recent Labs    08/13/20 [place] 3,210*   Microbiology Recent Results (from the past 240 hour(s))  Culture, Respiratory w Gram Stain     Status: None   Collection Time: 08/07/20  2:00 PM   Specimen: Tracheal Aspirate; Respiratory  Result Value Ref Range Status   Specimen Description TRACHEAL ASPIRATE  Final   Special Requests NONE  Final   Gram Stain   Final    FEW WBC PRESENT,BOTH PMN AND MONONUCLEAR NO ORGANISMS SEEN    Culture   Final    RARE Normal respiratory flora-no Staph aureus or Pseudomonas seen Performed at [facility], [address], [place], [place] [address]    Report Status 08/10/2020 FINAL  Final     Discharge Instructions:   Discharge Instructions     Discharge instructions   Complete by: As directed    Check blood work in 5 days.  Follow-up with provider at the rehabilitation facility.   Increase activity slowly   Complete by: As directed    No wound care   Complete by: As directed       Allergies as of 08/15/2020       Reactions   Naproxen Hives   No [facility] Food Allergies Other (See Comments)   Scallops - distress, nausea and vomitting        Medication List     STOP taking these medications    Co Q-10 300 MG Caps   COSAMIN DS PO   docusate sodium 100 MG capsule Commonly known as: COLACE   finasteride 5 MG tablet Commonly known as: PROSCAR   furosemide 20 MG tablet Commonly known as: LASIX   loratadine 10 MG tablet Commonly known as:  CLARITIN   metoprolol succinate 50 MG 24 hr tablet Commonly known as: TOPROL-XL Replaced by: metoprolol tartrate 25 mg/10 mL Susp   Moderna COVID-19 Vaccine 100 MCG/0.5ML injection Generic drug: COVID-19 mRNA vaccine (Moderna)  Multivital-M Tabs   potassium chloride SA 20 MEQ tablet Commonly known as: KLOR-CON   PROBIOTIC FORMULA PO   tamsulosin 0.4 MG Caps capsule Commonly known as: FLOMAX   Vitamin B-12 5000 MCG Subl   warfarin 5 MG tablet Commonly known as: COUMADIN       TAKE these medications    amantadine 100 MG capsule Commonly known as: SYMMETREL Place 1 capsule (100 mg total) into feeding tube 2 (two) times daily.   aspirin 325 MG tablet Place 1 tablet (325 mg total) into feeding tube daily. Start taking on: August 16, 2020   atorvastatin 80 MG tablet Commonly known as: LIPITOR Place 1 tablet (80 mg total) into feeding tube daily. What changed:  how to take this when to take this   clobetasol cream 0.05 % Commonly known as: TEMOVATE Apply 1 application topically daily as needed (for skin irritation).   dofetilide 500 MCG capsule Commonly known as: TIKOSYN Take 1 capsule (500 mcg total) by mouth 2 (two) times daily.   doxazosin 2 MG tablet Commonly known as: CARDURA Place 1 tablet (2 mg total) into feeding tube daily. Start taking on: August 16, 2020   enoxaparin 40 MG/0.4ML injection Commonly known as: LOVENOX Inject 0.4 mLs (40 mg total) into the skin daily. Start taking on: August 16, 2020   feeding supplement (PROSource TF) liquid Place 45 mLs into feeding tube 3 (three) times daily.   feeding supplement (OSMOLITE 1.5 CAL) Liqd Place 1,000 mLs into feeding tube continuous.   free water Soln Place 175 mLs into feeding tube every 4 (four) hours.   losartan 25 MG tablet Commonly known as: COZAAR Place 1 tablet (25 mg total) into feeding tube daily. What changed: how to take this   metoprolol tartrate 25 mg/10 mL Susp Commonly known as:  LOPRESSOR Place 5 mLs (12.5 mg total) into feeding tube every 6 (six) hours. Replaces: metoprolol succinate 50 MG 24 hr tablet   pantoprazole sodium 40 mg/20 mL Pack Commonly known as: PROTONIX Place 20 mLs (40 mg total) into feeding tube daily. Start taking on: August 16, 2020   polyethylene glycol 17 g packet Commonly known as: MIRALAX / GLYCOLAX Place 17 g into feeding tube daily. Start taking on: August 16, 2020   senna-docusate 8.6-50 MG tablet Commonly known as: Senokot-S Place 1 tablet into feeding tube at bedtime as needed for moderate constipation.          Time coordinating discharge: 39 minutes  Signed:  Keniah Klemmer  [facility] 08/15/2020, 11:32 AM

## 2020-08-15 NOTE — Progress Notes (Addendum)
Patient admitted to 4M11 about 1420. Wife at bedside. Rehab process reviewed with family. Patient opens eyes and follows some simple commands but nonverbal. Fall precautions reviewed with family. Bed alarm on

## 2020-08-15 NOTE — Progress Notes (Addendum)
Pharmacy: Dofetilide (Tikosyn) - Follow Up Assessment and Electrolyte Replacement  Pharmacy consulted to assist in monitoring and replacing electrolytes in this 80 y.o. male admitted on 08/07/2020 undergoing dofetilide continuation.   Labs:    Component Value Date/Time   K 3.6 08/15/2020 0112   K 4.1 11/26/2013 0000   MG 2.4 08/13/2020 0513     Plan: Potassium: K 3.6>>55meq x1  Magnesium: Mg>>2.1 no replacement needed   Onnie Boer, PharmD, BCIDP, AAHIVP, CPP Infectious Disease Pharmacist 08/15/2020 7:47 AM

## 2020-08-16 LAB — COMPREHENSIVE METABOLIC PANEL
ALT: 100 U/L — ABNORMAL HIGH (ref 0–44)
AST: 74 U/L — ABNORMAL HIGH (ref 15–41)
Albumin: 2.2 g/dL — ABNORMAL LOW (ref 3.5–5.0)
Alkaline Phosphatase: 63 U/L (ref 38–126)
Anion gap: 5 (ref 5–15)
BUN: 29 mg/dL — ABNORMAL HIGH (ref 8–23)
CO2: 26 mmol/L (ref 22–32)
Calcium: 8 mg/dL — ABNORMAL LOW (ref 8.9–10.3)
Chloride: 109 mmol/L (ref 98–111)
Creatinine, Ser: 0.69 mg/dL (ref 0.61–1.24)
GFR, Estimated: >60 mL/min (ref >60)
Glucose, Bld: 160 mg/dL — ABNORMAL HIGH (ref 70–99)
Potassium: 3.8 mmol/L (ref 3.5–5.1)
Sodium: 140 mmol/L (ref 135–145)
Total Bilirubin: 0.8 mg/dL (ref 0.3–1.2)
Total Protein: 5.2 g/dL — ABNORMAL LOW (ref 6.5–8.1)

## 2020-08-16 LAB — CBC WITH DIFFERENTIAL/PLATELET
Abs Immature Granulocytes: 0.03 10*3/uL (ref 0.00–0.07)
Basophils Absolute: 0 10*3/uL (ref 0.0–0.1)
Basophils Relative: 0 %
Eosinophils Absolute: 0.4 10*3/uL (ref 0.0–0.5)
Eosinophils Relative: 5 %
HCT: 28.2 % — ABNORMAL LOW (ref 39.0–52.0)
Hemoglobin: 9 g/dL — ABNORMAL LOW (ref 13.0–17.0)
Immature Granulocytes: 0 %
Lymphocytes Relative: 16 %
Lymphs Abs: 1.3 10*3/uL (ref 0.7–4.0)
MCH: 31.5 pg (ref 26.0–34.0)
MCHC: 31.9 g/dL (ref 30.0–36.0)
MCV: 98.6 fL (ref 80.0–100.0)
Monocytes Absolute: 0.4 10*3/uL (ref 0.1–1.0)
Monocytes Relative: 5 %
Neutro Abs: 5.7 10*3/uL (ref 1.7–7.7)
Neutrophils Relative %: 74 %
Platelets: 177 10*3/uL (ref 150–400)
RBC: 2.86 MIL/uL — ABNORMAL LOW (ref 4.22–5.81)
RDW: 14.2 % (ref 11.5–15.5)
WBC: 7.9 10*3/uL (ref 4.0–10.5)
nRBC: 0 % (ref 0.0–0.2)

## 2020-08-16 LAB — GLUCOSE, CAPILLARY
Glucose-Capillary: 126 mg/dL — ABNORMAL HIGH (ref 70–99)
Glucose-Capillary: 126 mg/dL — ABNORMAL HIGH (ref 70–99)
Glucose-Capillary: 131 mg/dL — ABNORMAL HIGH (ref 70–99)
Glucose-Capillary: 146 mg/dL — ABNORMAL HIGH (ref 70–99)
Glucose-Capillary: 160 mg/dL — ABNORMAL HIGH (ref 70–99)
Glucose-Capillary: 173 mg/dL — ABNORMAL HIGH (ref 70–99)

## 2020-08-16 MED ORDER — POTASSIUM CHLORIDE 20 MEQ PO PACK
40.0000 meq | PACK | Freq: Once | ORAL | Status: AC
  Administered 2020-08-16: 40 meq
  Filled 2020-08-16: qty 2

## 2020-08-16 NOTE — Evaluation (Signed)
Occupational Therapy Assessment and Plan  Patient Details  Name: [name] [name] MRN: [id number] Date of Birth: June 23, 1940  OT Diagnosis: abnormal posture, apraxia, hemiplegia affecting non-dominant side, and muscle weakness (generalized) Rehab Potential: Rehab Potential (ACUTE ONLY): Fair ELOS: 3-4 weeks   Today's Date: 08/16/2020 OT Individual Time: 0100-7121 OT Individual Time Calculation (min): 60 min     Hospital Problem: Principal Problem:   Acute ischemic right MCA stroke ([place])   Past Medical History:  Past Medical History:  Diagnosis Date   Acute rheumatic heart disease, unspecified    childhood, age  44 & 25   Acute rheumatic pericarditis    Atrial fibrillation ([place])    history   CHF (congestive heart failure) ([place])    Diverticulosis    Dysrhythmia    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   External hemorrhoids without mention of complication    H/O aortic valve replacement    H/O mitral valve replacement    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   Lesion of ulnar nerve    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    Other and unspecified hyperlipidemia    Pre-diabetes    Previous back surgery 1978, jan 2007   Psychosexual dysfunction with inhibited sexual excitement    SOB (shortness of breath)    "with heavy exercise"   Stroke ([place]) 08/2013   "I WAS IN AFIB AND THREW A CLOT, THE EFFECTS WERE TRANSITORY AND DIDNT LAST BUT FOR 30 MINUTES"    Thoracic aortic aneurysm [facility])    Past Surgical History:  Past Surgical History:  Procedure Laterality Date   AORTIC AND MITRAL VALVE REPLACEMENT     09/2004   CARDIOVERSION     3 times from 2004-2006   CARDIOVERSION N/A 09/26/2013   Procedure: CARDIOVERSION;  Surgeon: Larey Dresser, MD;  Location: [place];  Service: Cardiovascular;  Laterality: N/A;   CARDIOVERSION N/A 06/19/2014   Procedure: CARDIOVERSION;  Surgeon: Jerline Pain, MD;  Location: [place];  Service: Cardiovascular;  Laterality: N/A;    CARDIOVERSION N/A 04/24/2017   Procedure: CARDIOVERSION;  Surgeon: Larey Dresser, MD;  Location: [place];  Service: Cardiovascular;  Laterality: N/A;   CARDIOVERSION N/A 06/08/2017   Procedure: CARDIOVERSION;  Surgeon: Pixie Casino, MD;  Location: [facility] ENDOSCOPY;  Service: Cardiovascular;  Laterality: N/A;   CARDIOVERSION N/A 08/24/2017   Procedure: CARDIOVERSION;  Surgeon: Skeet Latch, MD;  Location: [facility] ENDOSCOPY;  Service: Cardiovascular;  Laterality: N/A;   CARDIOVERSION N/A 02/14/2018   Procedure: CARDIOVERSION;  Surgeon: Larey Dresser, MD;  Location: [facility] ENDOSCOPY;  Service: Cardiovascular;  Laterality: N/A;   COLONOSCOPY     CRANIOTOMY N/A 07/28/2020   Procedure: CRANIOTOMY FOR EVACUATION OF SUBDURAL HEMATOMA;  Surgeon: Eustace Fries, MD;  Location: [place];  Service: Neurosurgery;  Laterality: N/A;   IR ANGIO VERTEBRAL SEL SUBCLAVIAN INNOMINATE UNI R MOD SED  08/07/2020   IR CT HEAD LTD  08/07/2020   IR PERCUTANEOUS ART THROMBECTOMY/INFUSION INTRACRANIAL INC DIAG ANGIO  08/07/2020   laminectomies     10/1975 and in 03/2005   RADIOLOGY WITH ANESTHESIA N/A 08/07/2020   Procedure: IR WITH ANESTHESIA;  Surgeon: Luanne Bras, MD;  Location: [place];  Service: Radiology;  Laterality: N/A;   TONSILLECTOMY     1950   TOTAL HIP ARTHROPLASTY Right 04/03/2018   Procedure: TOTAL HIP ARTHROPLASTY ANTERIOR APPROACH;  Surgeon: Paralee Cancel, MD;  Location: [facility];  Service: Orthopedics;  Laterality: Right;  70 mins    Assessment & Plan Clinical Impression: [name] [name]. [name] [name] is a 80 year old male with history of diastolic CHF, AVR/MVR, A fib/A flutter -on coumadin/Tiksoyn who was admitted on 07/27/20 with difficulty speaking and right sided weakness after sustaining a fall at church the day before. 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 He was intubated for airway protection and family elected on DNR with comfort care due to poor prognosis.  However,  patient started showing ability to follow commands on 05/24 with repeat CT showing persistent bilateral SDH.  He was taken to the OR for right craniotomy for evacuation of SDH by Dr. Ronnald Ramp.  He tolerated extubation without difficulty but was limited by left greater than right-sided weakness with visual deficits, strong posterior bias as well as poor safety awareness with cognitive deficits.  He was admitted to rehab on 05/27 for intensive rehab program to consist of PT OT and speech therapy.   On 06/03 am, he was found to have decreased in [place] with left sided weakness and right inattention.  Code stroke activated and  CTA showed occluded R-ICA with exception of distal terminus and occlusion of proximal right M2 MCAts and perfusion study showed core infarct with hypoperfusion within R-MCA territory. with He underwent cerebral angio with revascularization of occluded R-ICA terminus. MRI brain done revealing confluent acute infarct throughout majority of R-MCA territory with some sparing of anterior insula and frontal operculum, spotty involvement right caudate improvement, petechial blood products and mass-effect with right-to-left 7.5 mm shift residual subdural blood and fluid on right as well as left parieto-occipital region.  Mentation was slowly improving with repeat CT showing 9 mm midline shift.     He was started on hypertonic saline for management of edema and core track placed for nutritional support.  Pharmacy consulted to assist with monitoring electrolytes secondary to Tikosyn on board.  He tolerated extubation on 06/08 and mentation continue to fluctuate with bouts of lethargy.  He was kept n.p.o. and amantadine was added to help with activation.  Dr. 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 fib with interruption of AC due to recent SAH/SDH.  ASA added with recommendations to consider DOAC in 10 days.  Therapy has been ongoing and patient limited by lethargy, does not attempt to phonate  or communicate, required hand overhand assist to take sips of water, has delayed in processing, left inattention with impulsivity and poor safety awareness.  CIR was recommended due to functional deficits.   Patient transferred to CIR on 08/15/2020 .    Patient currently requires total with basic self-care skills secondary to muscle weakness, abnormal tone, motor apraxia, decreased coordination, and decreased motor planning, decreased midline orientation, decreased attention to left, left side neglect, and decreased motor planning, decreased initiation, decreased attention, decreased awareness, decreased problem solving, decreased safety awareness, decreased memory, and delayed processing, and decreased sitting balance, decreased postural control, hemiplegia, and decreased balance strategies.  Prior to hospitalization, patient could complete ADLs with modified independent .  Patient will benefit from skilled intervention to decrease level of assist with basic self-care skills prior to discharge home with care partner.  Anticipate patient will require 24 hour supervision and minimal physical assistance and follow up home health vs SNF.  OT - End of Session Activity Tolerance: Decreased this session;Tolerates 10 - 20 min activity with multiple rests Endurance Deficit: Yes (Simultaneous filing. User may not have seen previous data.) Endurance Deficit  Description: lethargis and difficult to arouse in supine, requires cues to maintain arousal and attention (Simultaneous filing. User may not have seen previous data.) OT Assessment Rehab Potential (ACUTE ONLY): Fair OT Patient demonstrates impairments in the following area(s): Balance;Perception;Behavior;Safety;Cognition;Sensory;Skin Integrity;Edema;Endurance;Vision;Motor;Nutrition;Pain OT Basic ADL's Functional Problem(s): Eating;Grooming;Dressing;Toileting;Bathing OT Transfers Functional Problem(s): Toilet;Tub/Shower OT Additional Impairment(s): Fuctional  Use of Upper Extremity OT Plan OT Intensity: Minimum of 1-2 x/day, 45 to 90 minutes OT Frequency: 5 out of 7 days OT Duration/Estimated Length of Stay: 3-4 weeks OT Treatment/Interventions: Balance/vestibular training;Cognitive remediation/compensation;Community reintegration;DME/adaptive equipment instruction;Discharge planning;Functional electrical stimulation;Functional mobility training;Neuromuscular re-education;Patient/family education;Pain management;Self Care/advanced ADL retraining;Therapeutic Activities;UE/LE Coordination activities;Therapeutic Exercise;UE/LE Strength taining/ROM;Visual/perceptual remediation/compensation;Wheelchair propulsion/positioning OT Self Feeding Anticipated Outcome(s): set up assist OT Basic Self-Care Anticipated Outcome(s): min A OT Toileting Anticipated Outcome(s): min A OT Bathroom Transfers Anticipated Outcome(s): min A OT Recommendation Recommendations for Other Services: Speech consult Patient destination: Home Follow Up Recommendations: Home health OT;24 hour supervision/assistance Equipment Recommended: To be determined   OT Evaluation Precautions/Restrictions  Precautions Precautions: Fall Precaution Comments: cortrak Restrictions Weight Bearing Restrictions: No General Chart Reviewed: Yes Family/Caregiver Present: No   Pain Pain Assessment Pain Scale: Faces Faces Pain Scale: No hurt Home Living/Prior Functioning Home Living Family/patient expects to be discharged to:: Private residence Living Arrangements: Spouse/significant other Available Help at Discharge: Family, Available 24 hours/day Type of Home: Other(Comment) (ILF apartment) Home Access: Level entry Home Layout: One level Bathroom Shower/Tub: Multimedia programmer: Standard Bathroom Accessibility: Yes Additional Comments: Recently moved to [place] With: Spouse (wife, Optometrist) IADL History Homemaking Responsibilities: Yes Meal Prep Responsibility:  Secondary Laundry Responsibility: Secondary Cleaning Responsibility: Secondary Bill Paying/Finance Responsibility: Secondary Shopping Responsibility: Secondary Current License: Yes Education: Law school Occupation: Retired Type of Occupation: Retired Engineer, maintenance (IT) Level of Independence: [place] for independence, Independent with gait, Independent with transfers  Able to [place]?: Yes Driving: Yes Vocation: Retired Comments: Pt utilized a walking stick for ambulation on unlevel surfaces.  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[place] Baseline Vision/History: Wears glasses (Electrical engineer. User may not have seen previous data.) Wears Glasses: Reading only (Simultaneous filing. User may not have seen previous data.) Patient Visual Report:  (unable to report  Simultaneous filing. User may not have seen previous data.) Vision Assessment?: Vision impaired- to be further tested in functional context;Yes (Simultaneous filing. User may not have seen previous data.) Eye Alignment: Impaired (comment) (Simultaneous filing. User may not have seen previous data.) Ocular Range of Motion: Restricted on the left (Simultaneous filing. User may not have seen previous data.) Alignment/Gaze Preference: Gaze right Tracking/Visual Pursuits:  (unable to formally assess, could not track L) Additional Comments: R gaze preference, did not observe pt tracking past midline to the L at all. Did not turn head with multimodal cueing to stimuli on the L Perception  Perception: Impaired (Simultaneous filing. User may not have seen previous data.) Inattention/Neglect: Does not attend to left side of body (Simultaneous filing. User may not have seen previous data.) Figure Ground: Impaired, inattention vs vision vs sensation deficits in L Praxis Praxis: Impaired (perseveration, motor planning impaired) Praxis Impairment Details: Motor  planning;Initiation;Perseveration Cognition Overall Cognitive Status: Impaired/Different from baseline Arousal/Alertness: Lethargic Orientation Level: Nonverbal/unable to assess Memory: Impaired Immediate Memory Recall:  (unable to administer) Attention: Focused Focused Attention: Impaired Focused Attention Impairment: Verbal basic;Functional basic Awareness: Impaired Problem Solving: Impaired Problem Solving Impairment: Verbal basic;Functional basic Executive Function:  (all impaired 2/2 lower level deficits) Behaviors: Perseveration Safety/Judgment: Impaired Comments: pt aphasic,  very lethargic, following 25% of commands. Cognition will continue to be assessed as lethargy hopefully clears Sensation Sensation Light Touch: Not tested (unable to test, suspect some deficits in the LUE. responds to pain per PT) Proprioception: Impaired by gross assessment Coordination Gross Motor Movements are Fluid and Coordinated: No Fine Motor Movements are Fluid and Coordinated: No Coordination and Movement Description: L hemi, inattention and lethargy impacted coordination grossly Motor  Motor Motor: Hemiplegia;Abnormal postural alignment and control;Abnormal tone;Motor apraxia Motor - Skilled Clinical Observations: Tone felt in the L elbow, limited by lethargy and L hemi  Trunk/Postural Assessment  Cervical Assessment Cervical Assessment: Exceptions to [facility] Thoracic Assessment Thoracic Assessment: Exceptions to [facility] Lumbar Assessment Lumbar Assessment: Exceptions to [facility] Postural Control Postural Control: Deficits on evaluation  Balance Balance Balance Assessed: Yes Static Sitting Balance Static Sitting - Balance Support: No upper extremity supported;Feet supported Static Sitting - Level of Assistance: 1: +1 Total assist Static Standing Balance Static Standing - Balance Support: Bilateral upper extremity supported;During functional activity Static Standing - Level of Assistance: 2: Max  assist (in the stedy) Static Standing - Comment/# of Minutes: unable to attain full upright position Extremity/Trunk Assessment RUE Assessment RUE Assessment: Within Functional Limits Active Range of Motion (AROM) Comments: able to spontaneously reach out for objects and to his nose without restriction LUE Assessment LUE Assessment: Exceptions to [facility] General Strength Comments: Unable to formally test. Pt able to bring LUE to his nose without restriction and reach out for object, ~20 degrees off from full extension. Will continue to assess LUE Body System: Neuro Brunstrum levels for arm and hand: Arm;Hand Brunstrum level for arm: Stage IV Movement is deviating from synergy Brunstrum level for hand: Stage V Independence from basic synergies  Care Tool Care Tool Self Care Eating Eating activity did not occur: Safety/medical concerns (NPO, cortrak) Eating Assist Level: Dependent - Patient 0%    Oral Care    Oral Care Assist Level: Dependent - Patient 0%)    Bathing     Body parts bathed by helper: Right arm;Left arm;Chest;Right lower leg;Left lower leg;Face;Abdomen;Front perineal area;Buttocks;Right upper leg;Left upper leg   Assist Level: Dependent - Patient 0%    Upper Body Dressing(including orthotics)   What is the patient wearing?: Pull over shirt   Assist Level: Dependent - Patient 0%    Lower Body Dressing (excluding footwear)   What is the patient wearing?: Incontinence brief Assist for lower body dressing: 2 Helpers    Putting on/Taking off footwear   What is the patient wearing?: Non-skid slipper socks Assist for footwear: Dependent - Patient 0%       Care Tool Toileting Toileting activity   Assist for toileting: 2 Helpers     Care Tool Bed Mobility Roll left and right activity   Roll left and right assist level: Maximal Assistance - Patient 25 - 49%    Sit to lying activity   Sit to lying assist level: Maximal Assistance - Patient 25 - 49%    Lying to  sitting edge of bed activity   Lying to sitting edge of bed assist level: Maximal Assistance - Patient 25 - 49%     Care Tool Transfers Sit to stand transfer   Sit to stand assist level: Dependent - Patient 0% (Stedy)    Chair/bed transfer   Chair/bed transfer assist level: Dependent - Patient 0% Charlaine Dalton)     Materials engineer transfer activity did not occur: Safety/medical concerns       Care Tool  Cognition Expression of Ideas and Wants Expression of Ideas and Wants: Rarely/Never expressess or very difficult - rarely/never expresses self or speech is very difficult to understand   Understanding Verbal and Non-Verbal Content Understanding Verbal and Non-Verbal Content: Rarely/never understands   Memory/Recall Ability *first 3 days only Memory/Recall Ability *first 3 days only: None of the above were recalled    Refer to Care Plan for Long Term Goals  SHORT TERM GOAL WEEK 1 OT Short Term Goal 1 (Week 1): Pt will attend to ADL task for 1 minute with mod cueing OT Short Term Goal 2 (Week 1): Pt will don shirt with max A OT Short Term Goal 3 (Week 1): Pt will use BUE to bathe UB with mod A  Recommendations for other services: None   Skilled Therapeutic Intervention  Pt received supine with his wife present. Reviewed CLOF vs PLOF with Herbert Pun, pt's wife, and OT POC moving forward. Eval limited by lethargy, inattention to L, as well as global attention deficits, and L hemi/deficits in sitting balance. EOB pt required heavy max-total A for sitting balance. Slight increase in arousal EOB. See above for full evaluation. Pt left supine with all needs met, RN attending to needs.   ADL ADL Eating: NPO Grooming: Dependent Where Assessed-Grooming: Bed level Upper Body Bathing: Dependent Where Assessed-Upper Body Bathing: Edge of bed Lower Body Bathing: Dependent Where Assessed-Lower Body Bathing: Bed level Upper Body Dressing: Dependent Lower Body Dressing: Dependent Where  Assessed-Lower Body Dressing: Bed level Toileting: Unable to assess Where Assessed-Toileting: Glass blower/designer: Unable to assess Toilet Transfer Method: Unable to assess Mobility  Bed Mobility Bed Mobility: Rolling Right;Rolling Left Rolling Right: 2 Helpers Rolling Left: 2 Helpers Supine to Sit: Dependent - Patient equal 0% Sit to Supine: Dependent - Patient equal 0% Transfers Sit to Stand: 2 Helpers;Maximal Assistance - Patient 25-49% (Stedy from elevated surface) Stand to Sit: Maximal Assistance - Patient 25-49%;2 Helpers [employer])   Discharge Criteria: Patient will be discharged from OT if patient refuses treatment 3 consecutive times without medical reason, if treatment goals not met, if there is a change in medical status, if patient makes no progress towards goals or if patient is discharged from hospital.  The above assessment, treatment plan, treatment alternatives and goals were discussed and mutually agreed upon: by family  Curtis Sites 08/16/2020, 12:45 PM

## 2020-08-16 NOTE — Progress Notes (Signed)
PROGRESS NOTE   Subjective/Complaints:  Pt asleep- woke briefly to stimulation, but no speech or hand signals given.   Has condom cath- per wife, gets agitated if gets wet and cannot use Urinal at this time.    ROS: Limited by sedation/cognition  Objective:   No results found. Recent Labs    08/16/20 0427  WBC 7.9  HGB 9.0*  HCT 28.2*  PLT 177   Recent Labs    08/15/20 0112 08/16/20 0427  NA 146* 140  K 3.6 3.8  CL 114* 109  CO2 25 26  GLUCOSE 158* 160*  BUN 34* 29*  CREATININE 0.74 0.69  CALCIUM 8.2* 8.0*   No intake or output data in the 24 hours ending 08/16/20 1140      Physical Exam: Vital Signs Blood pressure 140/65, pulse 62, temperature 97.9 F (36.6 C), temperature source Oral, resp. rate 20, SpO2 100 %.    General: asleep- woke briefly to tactile stimuli-in bed supine;  NAD HENT: conjugate gaze; oropharynx moist CV: regular rate; no JVD Pulmonary: CTA B/L; no W/R/R- good air movement GI: soft, NT, ND, (+)BS; protuberant Psychiatric: sedated Neurological: sleepy Genitourinary:    Comments: Condom catheter placed- medium amber urine noted Musculoskeletal:    Cervical back: Normal range of motion and neck supple.    Comments: Responded to tickling- B/L in feet Grabbed our hands on R, not well on L No other compliance with MS exam  Skin:    Comments: Some ecchymoses on arms B/L - no backside breakdown  Neurological:    Mental Status: He is lethargic.    Comments: Non-verbal except for grunting sounds. Left facial weakness with puffing breaths. Right in attention? Does move all four with mac tactile/verbal cues. Was completely asleep- woke very briefly- wearing mittens B/L  Psychiatric:    Comments: sedated     Assessment/Plan: 1. Functional deficits which require 3+ hours per day of interdisciplinary therapy in a comprehensive inpatient rehab setting. Physiatrist is providing  close team supervision and 24 hour management of active medical problems listed below. Physiatrist and rehab team continue to assess barriers to discharge/monitor patient progress toward functional and medical goals  Care Tool:  Bathing              Bathing assist       Upper Body Dressing/Undressing Upper body dressing   What is the patient wearing?: Hospital gown only    Upper body assist Assist Level: Total Assistance - Patient < 25%    Lower Body Dressing/Undressing Lower body dressing      What is the patient wearing?: Incontinence brief     Lower body assist Assist for lower body dressing: Dependent - Patient 0%     Toileting Toileting    Toileting assist Assist for toileting: Dependent - Patient 0%     Transfers Chair/bed transfer  Transfers assist     Chair/bed transfer assist level: 2 Helpers     Locomotion Ambulation   Ambulation assist              Walk 10 feet activity   Assist           Walk 50  feet activity   Assist           Walk 150 feet activity   Assist           Walk 10 feet on uneven surface  activity   Assist           Wheelchair     Assist               Wheelchair 50 feet with 2 turns activity    Assist            Wheelchair 150 feet activity     Assist          Blood pressure 140/65, pulse 62, temperature 97.9 F (36.6 C), temperature source Oral, resp. rate 20, SpO2 100 %.  Medical Problem List and Plan: 1.  Lethargy and L hemiparesis with cognitive impairments secondary to R MCA stroke s/p previous/recent crani for R>>L SDHs             -patient may shower             -ELOS/Goals: `18-21 Days min- mod assist  First day of PT, OT and SLP- con't CIR milieu  2.  Antithrombotics: -DVT/anticoagulation:  SQ Lovenox             -antiplatelet therapy: ASA 325 mg/day 3. Pain Management: tylenol prn 4. Mood: LCSW to follow for evaluation and support.               -antipsychotic agents: N/A 5. Neuropsych: This patient is not capable of making decisions on his own behalf. 6. Skin/Wound Care: Routine pressure relief measures.  7. Fluids/Electrolytes/Nutrition: NPO with tube feeds.             -water flushes 175 cc every 4 hours 8. HTN: Monitor BP tid with SBP goal <140/90.             --continue losartan/day, cardura/day and Metoprolol every 6 hours  6/12- BP is borderline high- con't nitor for trend before changing meds.  9. T2DM: Hgb A1C- 6.2. Monitor BS evr  6/12- BG 120s-160s -con't regimen for now.  10. Lethargy: On Amantadine since 06/09 for activation. Might require Ritalin as well.  11. PAF: Monitor HR tid--question DOAC 10 days post stroke (On 06/13?). --SDH post fall 07/27/20             --continue Tikosyn bid             --Will consult pharmacy to help keep to keep K+>4.0/Mg levels supplemented. 12. Thoracic aortic aneurysm: [facility]ed by [facility] 13. Induced hypernatremia: Improving with increase water flushes  6/12- Down to 140- con't to monitor 14. Hypokalemia: Question dilutional-- pharmacy assisting with management.   15. Urinary incontinence-  6/12- will keep condom cath per pt's wife- gets agitated if gets wet at all-         LOS: 1 days A FACE TO FACE EVALUATION WAS PERFORMED  Aleia Larocca 08/16/2020, 11:40 AM

## 2020-08-16 NOTE — Plan of Care (Signed)
  Problem: RH BOWEL ELIMINATION Goal: RH STG MANAGE BOWEL WITH ASSISTANCE Description: STG Manage Bowel with Min Assistance. Outcome: Progressing Goal: RH STG MANAGE BOWEL W/MEDICATION W/ASSISTANCE Description: STG Manage Bowel with Medication with [place]. Outcome: Progressing   Problem: RH SAFETY Goal: RH STG ADHERE TO SAFETY PRECAUTIONS W/ASSISTANCE/DEVICE Description: STG Adhere to Safety Precautions With min Assistance/Device. Outcome: Progressing Goal: RH STG DECREASED RISK OF FALL WITH ASSISTANCE Description: STG Decreased Risk of Fall With min Assistance. Outcome: Progressing   Problem: RH PAIN MANAGEMENT Goal: RH STG PAIN MANAGED AT OR BELOW PT'S PAIN GOAL Description: < 3 on a 0-10 pain scale. Outcome: Progressing

## 2020-08-16 NOTE — Evaluation (Signed)
Speech Language Pathology Assessment and Plan  Patient Details  Name: [name] [name] MRN: [id number] Date of Birth: 02-Aug-1940  SLP Diagnosis: Cognitive Impairments;Dysphagia;Speech and Language deficits  Rehab Potential: Fair ELOS: 21-28 days    Today's Date: 08/16/2020 SLP Individual Time: 1300-1400 SLP Individual Time Calculation (min): 58 min   Hospital Problem: Principal Problem:   Acute ischemic right MCA stroke [facility])  Past Medical History:  Past Medical History:  Diagnosis Date   Acute rheumatic heart disease, unspecified    childhood, age  33 & 55   Acute rheumatic pericarditis    Atrial fibrillation ([place])    history   CHF (congestive heart failure) ([place])    Diverticulosis    Dysrhythmia    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   External hemorrhoids without mention of complication    H/O aortic valve replacement    H/O mitral valve replacement    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   Lesion of ulnar nerve    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    Other and unspecified hyperlipidemia    Pre-diabetes    Previous back surgery 1978, jan 2007   Psychosexual dysfunction with inhibited sexual excitement    SOB (shortness of breath)    "with heavy exercise"   Stroke ([place]) 08/2013   "I WAS IN AFIB AND THREW A CLOT, THE EFFECTS WERE TRANSITORY AND DIDNT LAST BUT FOR 30 MINUTES"    Thoracic aortic aneurysm [facility])    Past Surgical History:  Past Surgical History:  Procedure Laterality Date   AORTIC AND MITRAL VALVE REPLACEMENT     09/2004   CARDIOVERSION     3 times from 2004-2006   CARDIOVERSION N/A 09/26/2013   Procedure: CARDIOVERSION;  Surgeon: Larey Dresser, MD;  Location: [place];  Service: Cardiovascular;  Laterality: N/A;   CARDIOVERSION N/A 06/19/2014   Procedure: CARDIOVERSION;  Surgeon: Jerline Pain, MD;  Location: [place];  Service: Cardiovascular;  Laterality: N/A;   CARDIOVERSION N/A 04/24/2017   Procedure: CARDIOVERSION;  Surgeon:  Larey Dresser, MD;  Location: [place];  Service: Cardiovascular;  Laterality: N/A;   CARDIOVERSION N/A 06/08/2017   Procedure: CARDIOVERSION;  Surgeon: Pixie Casino, MD;  Location: [facility] ENDOSCOPY;  Service: Cardiovascular;  Laterality: N/A;   CARDIOVERSION N/A 08/24/2017   Procedure: CARDIOVERSION;  Surgeon: Skeet Latch, MD;  Location: [facility] ENDOSCOPY;  Service: Cardiovascular;  Laterality: N/A;   CARDIOVERSION N/A 02/14/2018   Procedure: CARDIOVERSION;  Surgeon: Larey Dresser, MD;  Location: [facility] ENDOSCOPY;  Service: Cardiovascular;  Laterality: N/A;   COLONOSCOPY     CRANIOTOMY N/A 07/28/2020   Procedure: CRANIOTOMY FOR EVACUATION OF SUBDURAL HEMATOMA;  Surgeon: Eustace Petree, MD;  Location: [place];  Service: Neurosurgery;  Laterality: N/A;   IR ANGIO VERTEBRAL SEL SUBCLAVIAN INNOMINATE UNI R MOD SED  08/07/2020   IR CT HEAD LTD  08/07/2020   IR PERCUTANEOUS ART THROMBECTOMY/INFUSION INTRACRANIAL INC DIAG ANGIO  08/07/2020   laminectomies     10/1975 and in 03/2005   RADIOLOGY WITH ANESTHESIA N/A 08/07/2020   Procedure: IR WITH ANESTHESIA;  Surgeon: Luanne Bras, MD;  Location: [place];  Service: Radiology;  Laterality: N/A;   TONSILLECTOMY     1950   TOTAL HIP ARTHROPLASTY Right 04/03/2018   Procedure: TOTAL HIP ARTHROPLASTY ANTERIOR APPROACH;  Surgeon: Paralee Cancel, MD;  Location: [facility];  Service: Orthopedics;  Laterality: Right;  70 mins    Assessment / Plan / Recommendation  Clinical Impression  [name] is a 80 year old male with history of diastolic CHF, AVR/MVR, A fib/A flutter -on coumadin/Tiksoyn who was admitted on 07/27/20 with difficulty speaking and right sided weakness after sustaining a fall at church the day before. 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 He was intubated for airway protection and family elected on DNR with comfort care due to poor prognosis.  However, patient started showing ability to follow commands on  05/24 with repeat CT showing persistent bilateral SDH.  He was taken to the OR for right craniotomy for evacuation of SDH by Dr. Ronnald Ramp.  He tolerated extubation without difficulty but was limited by left greater than right-sided weakness with visual deficits, strong posterior bias as well as poor safety awareness with cognitive deficits.  He was admitted to rehab on 05/27 for intensive rehab program to consist of PT OT and speech therapy.   On 06/03 am, he was found to have decreased in [place] with left sided weakness and right inattention.  Code stroke activated and  CTA showed occluded R-ICA with exception of distal terminus and occlusion of proximal right M2 MCAts and perfusion study showed core infarct with hypoperfusion within R-MCA territory. with He underwent cerebral angio with revascularization of occluded R-ICA terminus. MRI brain done revealing confluent acute infarct throughout majority of R-MCA territory with some sparing of anterior insula and frontal operculum, spotty involvement right caudate improvement, petechial blood products and mass-effect with right-to-left 7.5 mm shift residual subdural blood and fluid on right as well as left parieto-occipital region.  Mentation was slowly improving with repeat CT showing 9 mm midline shift.     He was started on hypertonic saline for management of edema and core track placed for nutritional support.  Pharmacy consulted to assist with monitoring electrolytes secondary to Tikosyn on board.  He tolerated extubation on 06/08 and mentation continue to fluctuate with bouts of lethargy.  He was kept n.p.o. and amantadine was added to help with activation.  Dr. 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 fib with interruption of AC due to recent SAH/SDH.  ASA added with recommendations to consider DOAC in 10 days.  Therapy has been ongoing and patient limited by lethargy, does not attempt to phonate or communicate, required hand overhand assist to take  sips of water, has delayed in processing, left inattention with impulsivity and poor safety awareness.  CIR was recommended due to functional deficits. SLP evaluation was completed on 08/16/2020 with results as follows:  Pt presents with severe multifactorial dysphagia characterized by decreased alertness for safe PO intake and left sided oral motor weakness.  Concomitantly he also exhibited decreased management of his secretions and required suctioning outside of PO intake during today's evaluation to mitigate wet vocal quality and weak coughing.  With limited trials of ice chips and thin liquids via teaspoon and cup sips, pt demonstrated minimal and inefficient oral manipulation of boluses, anterior loss of boluses in the setting of decreased labial seal, as well as both immediate and delayed coughing.  His risk of aspiration remains high at this time due to the abovementioned deficits and I recommend that pt remain NPO with alternative means of nutrition and trials of POs with SLP only following thorough oral care.   Pt also presents with significant and multifactorial cognitive-linguistic deficits.  As mentioned above, pt exhibited decreased alertness during today's evaluation in addition to decreased focused attention to stimuli and  a susceptibility to both internal and external distractions.  Pt needed almost constant redirection from wiping his nose around his NG tube, pulling at his compression devices, or adjusting the blankets on his bed.  He was nonverbal at the time of today's evaluation and was unable to vocalize on command but did spontaneously produce wet, congested vocalizations in response to stimulation.  At this time it is difficult to determine to what extent pt's communication challenges are linguistic in nature versus motor planning versus motor weakness versus influenced by cognition.   Pt did respond sporadically with gestures (hand squeeze or subtle head nod) to simple, immediately  relevant choice of two questions with max to total assist multimodal cues (stressing words, slowing speech, gestural cues, repetition of questions) but this does not yet appear a reliable means for communicating with caregivers.  He currently requires total assist for basic communication and cognition.   Given the deficits mentioned above, pt would benefit from skilled ST while inpatient in order to maximize functional independence and reduce burden of care prior to discharge.  Anticipate that pt will need 24/7 supervision at discharge in addition to [place] follow up at next level of care.     Skilled Therapeutic Interventions          Cognitive-linguistic and bedside swallow evaluation completed with results and recommendations reviewed with family.    SLP Assessment  Patient will need skilled [place] [facility] during CIR admission    Recommendations  SLP Diet Recommendations: NPO;Alternative means - temporary Medication Administration: Via alternative means Oral Care Recommendations: Oral care QID Patient destination: Home Follow up Recommendations: 24 hour supervision/assistance;Home Health SLP Equipment Recommended: To be determined    SLP Frequency 3 to 5 out of 7 days   SLP Duration  SLP Intensity  SLP Treatment/Interventions 21-28 days  Minumum of 1-2 x/day, 30 to 90 minutes  Cognitive remediation/compensation;Cueing hierarchy;Dysphagia/aspiration precaution training;Internal/external aids;Speech/Language facilitation;Environmental controls;Multimodal communication approach;Patient/family education    Pain Pain Assessment Pain Scale: 0-10 Pain Score: 0-No pain Faces Pain Scale: No hurt  Prior Functioning Cognitive/Linguistic Baseline: Within functional limits Type of Home: Other(Comment) (ILF)  Lives With: Spouse Available Help at Discharge: Family;Available 24 hours/day Education: Law school Vocation: Retired  Programmer, systems Overall Cognitive  Status: Impaired/Different from baseline Arousal/Alertness: Lethargic Orientation Level: Oriented to person Attention: Focused Focused Attention: Impaired Focused Attention Impairment: Verbal basic;Functional basic Memory: Impaired Memory Impairment: Storage deficit Immediate Memory Recall:  (unable to administer) Awareness: Impaired Awareness Impairment: Intellectual impairment Problem Solving: Impaired Problem Solving Impairment: Functional basic;Verbal basic Executive Function:  (all impaired 2/2 lower level deficits) Behaviors: Perseveration Safety/Judgment: Impaired Comments: pt aphasic, very lethargic, following 25% of commands. Cognition will continue to be assessed as lethargy hopefully clears  Comprehension Auditory Comprehension Overall Auditory Comprehension: Impaired Yes/No Questions: Impaired Basic Biographical Questions: 0-25% accurate Basic Immediate Environment Questions: 0-24% accurate Commands: Impaired One Step Basic Commands: 0-24% accurate Interfering Components: Attention EffectiveTechniques: Repetition;Visual/Gestural cues;Stressing words;Slowed speech Expression Expression Primary Mode of Expression: Nonverbal - gestures Verbal Expression Overall Verbal Expression: Impaired Other Verbal Expression Comments: pt nonverbal at this time Written Expression Dominant Hand: Right Oral Motor Oral Motor/Sensory Function Overall Oral Motor/Sensory Function: Moderate impairment Facial ROM: Reduced left;Suspected CN VII (facial) dysfunction Facial Symmetry: Abnormal symmetry left;Suspected CN VII (facial) dysfunction Facial Strength: Reduced left;Suspected CN VII (facial) dysfunction  Care Tool Care Tool Cognition Expression of Ideas and Wants Expression of Ideas and Wants: Rarely/Never expressess or very difficult - rarely/never expresses self or speech  is very difficult to understand   Understanding Verbal and Non-Verbal Content Understanding Verbal and  Non-Verbal Content: Rarely/never understands   Memory/Recall Ability *first 3 days only Memory/Recall Ability *first 3 days only: None of the above were recalled     Bedside Swallowing Assessment General Previous Swallow Assessment: BSE 08/13/2020 Diet Prior to this Study: NPO;NG Tube Temperature Spikes Noted: No Respiratory Status: Room air History of Recent Intubation: Yes Length of Intubations (days): 5 days Date extubated: 08/12/20 Behavior/Cognition: Lethargic/Drowsy;Distractible;Requires cueing;Doesn't follow directions Oral Cavity - Dentition: Adequate natural dentition Self-Feeding Abilities: Total assist Vision: Impaired for self-feeding Patient Positioning: Upright in bed Baseline Vocal Quality: Wet Volitional Cough: Cognitively unable to elicit Volitional Swallow: Unable to elicit  Oral Care Assessment   Ice Chips Ice chips: Impaired Presentation: Spoon Oral Phase Impairments: Reduced labial seal;Reduced lingual movement/coordination;Poor awareness of bolus Oral Phase Functional Implications: Oral holding Pharyngeal Phase Impairments: Cough - Immediate;Unable to trigger swallow Thin Liquid Thin Liquid: Impaired Presentation: Spoon;Cup Oral Phase Impairments: Reduced labial seal;Poor awareness of bolus;Reduced lingual movement/coordination Oral Phase Functional Implications: Oral holding Pharyngeal  Phase Impairments: Suspected delayed Swallow;Cough - Delayed Nectar Thick   Honey Thick   Puree   Solid   BSE Assessment Risk for Aspiration Impact on safety and function: Severe aspiration risk Other Related Risk Factors: Deconditioning;Prolonged intubation;Lethargy;Cognitive impairment  Short Term Goals: Week 1: SLP Short Term Goal 1 (Week 1): Pt will consume therapeutic trials of ice chips and/or small amounts of water with minimal overt s/s of aspiration and max assist for use of swallowing precautions. SLP Short Term Goal 2 (Week 1): Pt will focus his  attention to a targeted stimulus in >50% of opportunities with max assist multimodal cues. SLP Short Term Goal 3 (Week 1): Pt will maintain alertness for periods of >5 minutes to participate in basic ADLs with max assist multimodal cues. SLP Short Term Goal 4 (Week 1): Pt will use multimodal means of communication (gestures, head nod/shake, pointing, verbalizing, vocalizing) to convey his immediate needs and wants to caregivers with max assist multimodal cues.  Refer to Care Plan for Long Term Goals  Recommendations for other services: None   Discharge Criteria: Patient will be discharged from SLP if patient refuses treatment 3 consecutive times without medical reason, if treatment goals not met, if there is a change in medical status, if patient makes no progress towards goals or if patient is discharged from hospital.  The above assessment, treatment plan, treatment alternatives and goals were discussed and mutually agreed upon: by patient and by family  Kamaury Cutbirth, Selinda Orion 08/16/2020, 3:06 PM

## 2020-08-16 NOTE — Evaluation (Signed)
Physical Therapy Assessment and Plan  Patient Details  Name: [name] [name] MRN: [id number] Date of Birth: 1940/03/30  PT Diagnosis: Abnormal posture, Abnormality of gait, Cognitive deficits, Coordination disorder, Difficulty walking, Edema, Hemiplegia non-dominant, Impaired cognition, and Muscle weakness Rehab Potential: Good ELOS: 3-4 weeks   Today's Date: 08/16/2020 PT Individual Time: 0800-0900 PT Individual Time Calculation (min): 60 min    Hospital Problem: Principal Problem:   Acute ischemic right MCA stroke ([place])   Past Medical History:  Past Medical History:  Diagnosis Date   Acute rheumatic heart disease, unspecified    childhood, age  80 & 12   Acute rheumatic pericarditis    Atrial fibrillation ([place])    history   CHF (congestive heart failure) ([place])    Diverticulosis    Dysrhythmia    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   External hemorrhoids without mention of complication    H/O aortic valve replacement    H/O mitral valve replacement    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   Lesion of ulnar nerve    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    Other and unspecified hyperlipidemia    Pre-diabetes    Previous back surgery 1978, jan 2007   Psychosexual dysfunction with inhibited sexual excitement    SOB (shortness of breath)    "with heavy exercise"   Stroke ([place]) 08/2013   "I WAS IN AFIB AND THREW A CLOT, THE EFFECTS WERE TRANSITORY AND DIDNT LAST BUT FOR 30 MINUTES"    Thoracic aortic aneurysm [facility])    Past Surgical History:  Past Surgical History:  Procedure Laterality Date   AORTIC AND MITRAL VALVE REPLACEMENT     09/2004   CARDIOVERSION     3 times from 2004-2006   CARDIOVERSION N/A 09/26/2013   Procedure: CARDIOVERSION;  Surgeon: Larey Dresser, MD;  Location: [place];  Service: Cardiovascular;  Laterality: N/A;   CARDIOVERSION N/A 06/19/2014   Procedure: CARDIOVERSION;  Surgeon: Jerline Pain, MD;  Location: [place];  Service:  Cardiovascular;  Laterality: N/A;   CARDIOVERSION N/A 04/24/2017   Procedure: CARDIOVERSION;  Surgeon: Larey Dresser, MD;  Location: [place];  Service: Cardiovascular;  Laterality: N/A;   CARDIOVERSION N/A 06/08/2017   Procedure: CARDIOVERSION;  Surgeon: Pixie Casino, MD;  Location: [facility] ENDOSCOPY;  Service: Cardiovascular;  Laterality: N/A;   CARDIOVERSION N/A 08/24/2017   Procedure: CARDIOVERSION;  Surgeon: Skeet Latch, MD;  Location: [facility] ENDOSCOPY;  Service: Cardiovascular;  Laterality: N/A;   CARDIOVERSION N/A 02/14/2018   Procedure: CARDIOVERSION;  Surgeon: Larey Dresser, MD;  Location: [facility] ENDOSCOPY;  Service: Cardiovascular;  Laterality: N/A;   COLONOSCOPY     CRANIOTOMY N/A 07/28/2020   Procedure: CRANIOTOMY FOR EVACUATION OF SUBDURAL HEMATOMA;  Surgeon: Eustace Schiraldi, MD;  Location: [place];  Service: Neurosurgery;  Laterality: N/A;   IR ANGIO VERTEBRAL SEL SUBCLAVIAN INNOMINATE UNI R MOD SED  08/07/2020   IR CT HEAD LTD  08/07/2020   IR PERCUTANEOUS ART THROMBECTOMY/INFUSION INTRACRANIAL INC DIAG ANGIO  08/07/2020   laminectomies     10/1975 and in 03/2005   RADIOLOGY WITH ANESTHESIA N/A 08/07/2020   Procedure: IR WITH ANESTHESIA;  Surgeon: Luanne Bras, MD;  Location: [place];  Service: Radiology;  Laterality: N/A;   TONSILLECTOMY     1950   TOTAL HIP ARTHROPLASTY Right 04/03/2018   Procedure: TOTAL HIP ARTHROPLASTY ANTERIOR APPROACH;  Surgeon: Paralee Cancel, MD;  Location: [facility];  Service: Orthopedics;  Laterality: Right;  70 mins    Assessment & Plan Clinical Impression: Patient is a 80 y.o. year old male with history of diastolic CHF, AVR/MVR, A fib/A flutter -on coumadin/Tiksoyn who was admitted on 07/27/20 with difficulty speaking and right sided weakness after sustaining a fall at church the day before. 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 He was intubated for airway protection and family elected on DNR with comfort care due  to poor prognosis.  However, patient started showing ability to follow commands on 05/24 with repeat CT showing persistent bilateral SDH.  He was taken to the OR for right craniotomy for evacuation of SDH by Dr. Ronnald Ramp.  He tolerated extubation without difficulty but was limited by left greater than right-sided weakness with visual deficits, strong posterior bias as well as poor safety awareness with cognitive deficits.  He was admitted to rehab on 05/27 for intensive rehab program to consist of PT OT and speech therapy.   On 06/03 am, he was found to have decreased in [place] with left sided weakness and right inattention.  Code stroke activated and  CTA showed occluded R-ICA with exception of distal terminus and occlusion of proximal right M2 MCAts and perfusion study showed core infarct with hypoperfusion within R-MCA territory. with He underwent cerebral angio with revascularization of occluded R-ICA terminus. MRI brain done revealing confluent acute infarct throughout majority of R-MCA territory with some sparing of anterior insula and frontal operculum, spotty involvement right caudate improvement, petechial blood products and mass-effect with right-to-left 7.5 mm shift residual subdural blood and fluid on right as well as left parieto-occipital region.  Mentation was slowly improving with repeat CT showing 9 mm midline shift.     He was started on hypertonic saline for management of edema and core track placed for nutritional support.  Pharmacy consulted to assist with monitoring electrolytes secondary to Tikosyn on board.  He tolerated extubation on 06/08 and mentation continue to fluctuate with bouts of lethargy.  He was kept n.p.o. and amantadine was added to help with activation.  Dr. 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 fib with interruption of AC due to recent SAH/SDH.  ASA added with recommendations to consider DOAC in 10 days.  Therapy has been ongoing and patient limited by lethargy,  does not attempt to phonate or communicate, required hand overhand assist to take sips of water, has delayed in processing, left inattention with impulsivity and poor safety awareness.  CIR was recommended due to functional deficits. Patient transferred to CIR on 08/15/2020 .   Patient currently requires max with mobility secondary to muscle weakness and muscle joint tightness, decreased cardiorespiratoy endurance, impaired timing and sequencing, abnormal tone, unbalanced muscle activation, decreased coordination, and decreased motor planning, decreased visual motor skills, decreased midline orientation and decreased attention to left, decreased initiation, decreased attention, decreased awareness, decreased problem solving, decreased safety awareness, decreased memory, and delayed processing, and decreased sitting balance, decreased standing balance, decreased postural control, hemiplegia, and decreased balance strategies.  Prior to hospitalization, patient was modified independent  with mobility and lived with Spouse (wife, Herbert Pun) in a Other(Comment) (ILF apartment) home.  Home access is  Level entry.  Patient will benefit from skilled PT intervention to maximize safe functional mobility, minimize fall risk, and decrease caregiver burden for planned discharge home with 24 hour assist.  Anticipate patient will benefit from follow up [facility] at discharge.  PT - End of Session Activity Tolerance:  Tolerates 10 - 20 min activity with multiple rests Endurance Deficit: Yes (Simultaneous filing. User may not have seen previous data.) Endurance Deficit Description: lethargis and difficult to arouse in supine, requires cues to maintain arousal and attention (Simultaneous filing. User may not have seen previous data.) PT Assessment Rehab Potential (ACUTE/IP ONLY): Good PT Barriers to Discharge: Incontinence;Neurogenic Bowel & Bladder;Behavior PT Patient demonstrates impairments in the following area(s):  Balance;Perception;Safety;Behavior;Edema;Sensory;Skin Integrity;Endurance;Motor;Nutrition;Pain PT Transfers Functional Problem(s): Bed Mobility;Bed to Chair;Car;Furniture PT Locomotion Functional Problem(s): Ambulation;Wheelchair Mobility;Stairs PT Plan PT Intensity: Minimum of 1-2 x/day ,45 to 90 minutes PT Frequency: 5 out of 7 days PT Duration Estimated Length of Stay: 3-4 weeks PT Treatment/Interventions: Ambulation/gait training;Community reintegration;DME/adaptive equipment instruction;Neuromuscular re-education;Psychosocial support;Stair training;UE/LE Strength taining/ROM;Balance/vestibular training;Functional electrical stimulation;Discharge planning;Pain management;Skin care/wound management;Therapeutic Activities;UE/LE Coordination activities;Cognitive remediation/compensation;Disease management/prevention;Functional mobility training;Patient/family education;Splinting/orthotics;Therapeutic Exercise;Visual/perceptual remediation/compensation;Wheelchair propulsion/positioning PT Transfers Anticipated Outcome(s): min A using LRAD PT Locomotion Anticipated Outcome(s): max A using LRAD PT Recommendation Follow Up Recommendations: Home health PT;Skilled nursing facility (may need skilled depending on patient progress) Patient destination: Home Equipment Recommended: To be determined   PT Evaluation Precautions/Restrictions Precautions Precautions: Fall Precaution Comments: cortrak Restrictions Weight Bearing Restrictions: No Home Living/Prior Functioning Home Living Available Help at Discharge: Family;Available 24 hours/day Type of Home: Other(Comment) (ILF apartment) Home Access: Level entry Home Layout: One level Bathroom Shower/Tub: Multimedia programmer: Standard Bathroom Accessibility: Yes Additional Comments: Recently moved to [place] With: Spouse (wife, Herbert Pun) Prior Function Level of Independence: Requires assistive device for  independence;Independent with gait;Independent with transfers  Able to Take Stairs?: Yes Driving: Yes Vocation: Retired Comments: Pt utilized a walking stick for ambulation on unlevel surfaces.  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[employer] - Assessment Eye Alignment: Impaired (comment) (Simultaneous filing. User may not have seen previous data.) Ocular Range of Motion: Restricted on the left (Simultaneous filing. User may not have seen previous data.) Alignment/Gaze Preference: Gaze right Tracking/Visual Pursuits:  (unable to formally assess, could not track L) Additional Comments: R gaze preference, did not observe pt tracking past midline to the L at all. Did not turn head with multimodal cueing to stimuli on the L Perception Perception: Impaired (Simultaneous filing. User may not have seen previous data.) Inattention/Neglect: Does not attend to left side of body (Simultaneous filing. User may not have seen previous data.) Figure Ground: Impaired, inattention vs vision vs sensation deficits in L Praxis Praxis: Impaired (perseveration, motor planning impaired) Praxis Impairment Details: Motor planning;Initiation;Perseveration  Cognition Overall Cognitive Status: Impaired/Different from baseline Arousal/Alertness: Lethargic Orientation Level: Oriented to person Attention: Focused Focused Attention: Impaired Focused Attention Impairment: Verbal basic;Functional basic Memory: Impaired Memory Impairment: Storage deficit Immediate Memory Recall:  (unable to administer) Awareness: Impaired Awareness Impairment: Intellectual impairment Problem Solving: Impaired Problem Solving Impairment: Functional basic;Verbal basic Executive Function:  (all impaired 2/2 lower level deficits) Behaviors: Perseveration Safety/Judgment: Impaired Comments: pt aphasic Sensation Sensation Light Touch: Not tested (unable to test, suspect some deficits in the LUE. responds  to pain per PT) Proprioception: Impaired by gross assessment Coordination Gross Motor Movements are Fluid and Coordinated: No Fine Motor Movements are Fluid and Coordinated: No Coordination and Movement Description: L hemi, inattention and lethargy impacted coordination grossly Motor  Motor Motor: Hemiplegia;Abnormal postural alignment and control;Abnormal tone;Motor apraxia Motor - Skilled Clinical Observations: Tone felt in the L elbow, limited by lethargy and L hemi   Trunk/Postural Assessment  Cervical Assessment Cervical Assessment: Exceptions to [facility] Thoracic Assessment Thoracic Assessment: Exceptions to [facility] Lumbar Assessment  Lumbar Assessment: Exceptions to [facility] Postural Control Postural Control: Deficits on evaluation  Balance Balance Balance Assessed: Yes Static Sitting Balance Static Sitting - Balance Support: No upper extremity supported;Feet supported Static Sitting - Level of Assistance: 1: +1 Total assist Static Standing Balance Static Standing - Balance Support: Bilateral upper extremity supported;During functional activity Static Standing - Level of Assistance: 2: Max assist (in the stedy) Static Standing - Comment/# of Minutes: unable to attain full upright position Extremity Assessment  RUE Assessment RUE Assessment: Within Functional Limits Active Range of Motion (AROM) Comments: able to spontaneously reach out for objects and to his nose without restriction LUE Assessment LUE Assessment: Exceptions to [facility] General Strength Comments: Unable to formally test. Pt able to bring LUE to his nose without restriction and reach out for object, ~20 degrees off from full extension. Will continue to assess LUE Body System: Neuro Brunstrum levels for arm and hand: Arm;Hand Brunstrum level for arm: Stage IV Movement is deviating from synergy Brunstrum level for hand: Stage V Independence from basic synergies RLE Assessment RLE Assessment: Exceptions to [facility] Passive Range  of Motion (PROM) Comments: Tight DF, able to get to neutral General Strength Comments: Grossly at least 2+/5 throughout, able to initiation all motions with functional mobility LLE Assessment LLE Assessment: Exceptions to [facility] Passive Range of Motion (PROM) Comments: tight DF, able to get to neutral with prolonged stretch General Strength Comments: grossly 0-1/5 with functional mobility, limited by L inattention  Care Tool Care Tool Bed Mobility Roll left and right activity   Roll left and right assist level: Maximal Assistance - Patient 25 - 49%    Sit to lying activity   Sit to lying assist level: Maximal Assistance - Patient 25 - 49%    Lying to sitting edge of bed activity   Lying to sitting edge of bed assist level: Maximal Assistance - Patient 25 - 49%     Care Tool Transfers Sit to stand transfer   Sit to stand assist level: Dependent - Patient 0% (Stedy)    Chair/bed transfer   Chair/bed transfer assist level: Dependent - Patient 0% Customer service manager)     Materials engineer transfer activity did not occur: Safety/medical Personal assistant transfer activity did not occur: Safety/medical concerns (decreased strength/activity tolerance)        Care Tool Locomotion Ambulation Ambulation activity did not occur: Safety/medical concerns        Walk 10 feet activity Walk 10 feet activity did not occur: Safety/medical concerns       Walk 50 feet with 2 turns activity Walk 50 feet with 2 turns activity did not occur: Safety/medical concerns      Walk 150 feet activity Walk 150 feet activity did not occur: Safety/medical concerns      Walk 10 feet on uneven surfaces activity Walk 10 feet on uneven surfaces activity did not occur: Safety/medical concerns      Stairs Stair activity did not occur: Safety/medical concerns        Walk up/down 1 step activity Walk up/down 1 step or curb (drop down) activity did not occur: Safety/medical concerns     Walk up/down  4 steps activity did not occuR: Safety/medical concerns  Walk up/down 4 steps activity      Walk up/down 12 steps activity Walk up/down 12 steps activity did not occur: Safety/medical concerns      Pick up small objects from floor Pick up small object from the floor (  from standing position) activity did not occur: Safety/medical Arts administrator activity did not occur: Safety/medical concerns      Wheel 50 feet with 2 turns activity Wheelchair 50 feet with 2 turns activity did not occur: Safety/medical concerns    Wheel 150 feet activity Wheelchair 150 feet activity did not occur: Safety/medical concerns      Refer to Care Plan for Long Term Goals  SHORT TERM GOAL WEEK 1 PT Short Term Goal 1 (Week 1): Pt will perform supine<>sit with mod-max A of 1 person consistently. PT Short Term Goal 2 (Week 1): Pt will perform sit<>stands using LRAD with mod A +2 PT Short Term Goal 3 (Week 1): Pt will perform bed<>chair transfers using LRAD with max A of 1 person PT Short Term Goal 4 (Week 1): Patient will initiation gait training PT Short Term Goal 5 (Week 1): Patient will perform sitting balance with mod A consistently  Recommendations for other services: None   Skilled Therapeutic Intervention Evaluation completed (see details above and below) with education on PT POC and goals and individual treatment initiated with focus on functional mobility/transfers, LE strength, dynamic standing balance/coordination, ambulation, stair navigation, simulated car transfers, and improved endurance with activity. Patient provided with TIS wheelchair with Roho cushion due to incontinence and adjustments made to promote optimal seating posture and pressure distribution.  Patient in bed upon PT arrival. Patient asleep and difficult to arouse initially. Responded to painful stimulus then vocal cues to attend to therapist. Remained intermittently lethargic throughout evaluation,  demonstrated significant deficits in attention and initiation requiring max multi-modal cues for >1 step commands. Able to follow commands consistently when able to attend. Patient's wife arrived 10 min into evaluation and confirmed home set-up.   Therapeutic Activity: Bed Mobility: Patient performed rolling R/L and supine to/from sit with max A with increased time for initiation and hand over hand assist for reaching for bed rail and bending opposite lower extremity. Provided verbal cues for 1-2-3 count to initiate pushing up and sustain R elbow to the bed to control lowering his trunk when returning to supine. Transfers: Patient attempted sit to/from stand with max A x1, patient unable to initiate standing without AD. Patient then performed sit to/from stand x2 and transfer to TIS w/c in the [place] with max A after multi-modal cues for B hand placement on the bar with hand-over-hand assist (patient able to complete independently with R when attending initiation 50% of reach with L), sustaining attention to keeping his hands on the bars to stand, and multiple attempts due to attention and motor planning deficits.   Neuromuscular Re-ed: Patient performed the following sitting balance activities: -sitting balance >5 min focused on midline orientation for posterior trunk lean and mild L lean using visual target for R shoulder to bring his trunk forward and R, provided hand over hand assist to place his hands in his lap, and performed reciprocal scooting with mod-max A x2 due to patient sliding forward on the bed with posterior lean, progressed from max A to mod-min A for sitting balance -forward weight shift for pre-stand 2x5 focused on forward trunk lean and initiation of a functional task  Instructed patient and his wife in results of PT evaluation as detailed above, PT POC, rehab potential, rehab goals, and discharge recommendations. Additionally discussed CIR's policies regarding fall safety and use of  chair alarm and/or quick release belt. Pt verbalized understanding and in agreement.   Patient  in TIS w/c with his wife in the room at end of session with breaks locked and all needs within reach.   Session 2: Returned 1 hour after session to return patient to the bed. Performed Stedy transfer back to bed with max A +2, after 3 attempts with 1 person assist limited by patient fatigue. Patient performed sit to supine as described above. Patient in bed with his wife and son in the room at end of session. Instructed patient's son in results of PT evaluation as detailed above, PT POC, rehab potential, rehab goals, and discharge recommendations. Patient with increased lethargy/fatigue during second session, utilized patient's son as stimulation to assist with patient arousal during mobility.   Discharge Criteria: Patient will be discharged from PT if patient refuses treatment 3 consecutive times without medical reason, if treatment goals not met, if there is a change in medical status, if patient makes no progress towards goals or if patient is discharged from hospital.  The above assessment, treatment plan, treatment alternatives and goals were discussed and mutually agreed upon: by family  Doreene Burke PT, DPT  08/16/2020, 12:47 PM

## 2020-08-16 NOTE — Progress Notes (Addendum)
Pharmacy: Dofetilide (Tikosyn) - Follow Up Assessment and Electrolyte Replacement  Pharmacy consulted to assist in monitoring and replacing electrolytes in this 80 y.o. male admitted on 08/15/2020 continuing dofetilide.   Labs:    Component Value Date/Time   K 3.8 08/16/2020 0427   MG 2.1 08/15/2020 0212    Plan: Potassium: K 3.8-3.9:  Give KCl 40 mEq per tube x1   Magnesium: Mg > 2: No additional supplementation needed. No Mg lab available today, has been >2 since 6/6, will order daily Mg starting tomorrow with AM labs.  Thank you for allowing pharmacy to participate in this patient's care.  Fara Olden, PharmD PGY-1 Pharmacy Resident 08/16/2020 8:03 AM Please see AMION for all pharmacy numbers

## 2020-08-17 ENCOUNTER — Inpatient Hospital Stay (HOSPITAL_COMMUNITY): Payer: Medicare Other

## 2020-08-17 LAB — BASIC METABOLIC PANEL
Anion gap: 5 (ref 5–15)
BUN: 23 mg/dL (ref 8–23)
CO2: 26 mmol/L (ref 22–32)
Calcium: 8 mg/dL — ABNORMAL LOW (ref 8.9–10.3)
Chloride: 106 mmol/L (ref 98–111)
Creatinine, Ser: 0.74 mg/dL (ref 0.61–1.24)
GFR, Estimated: >60 mL/min (ref >60)
Glucose, Bld: 145 mg/dL — ABNORMAL HIGH (ref 70–99)
Potassium: 3.6 mmol/L (ref 3.5–5.1)
Sodium: 137 mmol/L (ref 135–145)

## 2020-08-17 LAB — CBC
HCT: 27.5 % — ABNORMAL LOW (ref 39.0–52.0)
Hemoglobin: 8.7 g/dL — ABNORMAL LOW (ref 13.0–17.0)
MCH: 31.2 pg (ref 26.0–34.0)
MCHC: 31.6 g/dL (ref 30.0–36.0)
MCV: 98.6 fL (ref 80.0–100.0)
Platelets: 205 10*3/uL (ref 150–400)
RBC: 2.79 MIL/uL — ABNORMAL LOW (ref 4.22–5.81)
RDW: 13.9 % (ref 11.5–15.5)
WBC: 7.9 10*3/uL (ref 4.0–10.5)
nRBC: 0 % (ref 0.0–0.2)

## 2020-08-17 LAB — GLUCOSE, CAPILLARY
Glucose-Capillary: 110 mg/dL — ABNORMAL HIGH (ref 70–99)
Glucose-Capillary: 125 mg/dL — ABNORMAL HIGH (ref 70–99)
Glucose-Capillary: 133 mg/dL — ABNORMAL HIGH (ref 70–99)
Glucose-Capillary: 140 mg/dL — ABNORMAL HIGH (ref 70–99)
Glucose-Capillary: 145 mg/dL — ABNORMAL HIGH (ref 70–99)
Glucose-Capillary: 156 mg/dL — ABNORMAL HIGH (ref 70–99)

## 2020-08-17 LAB — MAGNESIUM: Magnesium: 1.9 mg/dL (ref 1.7–2.4)

## 2020-08-17 MED ORDER — POTASSIUM CHLORIDE 20 MEQ PO PACK
60.0000 meq | PACK | Freq: Once | ORAL | Status: AC
  Administered 2020-08-17: 60 meq
  Filled 2020-08-17: qty 3

## 2020-08-17 MED ORDER — JEVITY 1.5 CAL/FIBER PO LIQD
1000.0000 mL | ORAL | Status: DC
  Filled 2020-08-17 (×5): qty 1000

## 2020-08-17 MED ORDER — SODIUM CHLORIDE 0.9 % IV SOLN
INTRAVENOUS | Status: DC | PRN
  Administered 2020-08-17: 5 mL/h via INTRAVENOUS

## 2020-08-17 MED ORDER — METHYLPHENIDATE HCL 5 MG PO TABS
5.0000 mg | ORAL_TABLET | Freq: Every day | ORAL | Status: DC
  Administered 2020-08-18 – 2020-08-21 (×4): 5 mg via ORAL
  Filled 2020-08-17 (×4): qty 1

## 2020-08-17 MED ORDER — MAGNESIUM SULFATE 2 GM/50ML IV SOLN
2.0000 g | Freq: Once | INTRAVENOUS | Status: AC
  Administered 2020-08-17: 2 g via INTRAVENOUS
  Filled 2020-08-17: qty 50

## 2020-08-17 NOTE — Progress Notes (Signed)
Physical Therapy Session Note  Patient Details  Name: [name] [name] MRN: [id number] Date of Birth: [birth year]  Today's Date: 08/17/2020 PT Individual Time: 0915-1015 PT Individual Time Calculation (min): 60 min   Short Term Goals: Week 1:  PT Short Term Goal 1 (Week 1): Pt will perform supine<>sit with mod-max A of 1 person consistently. PT Short Term Goal 2 (Week 1): Pt will perform sit<>stands using LRAD with mod A +2 PT Short Term Goal 3 (Week 1): Pt will perform bed<>chair transfers using LRAD with max A of 1 person PT Short Term Goal 4 (Week 1): Patient will initiation gait training PT Short Term Goal 5 (Week 1): Patient will perform sitting balance with mod A consistently  Skilled Therapeutic Interventions/Progress Updates:    Pain:  Pt reports no pain.  Treatment to tolerance.  Rest breaks and repositioning as needed.  Pt initially sleeping, slow to awaken, nonverbal thru session, moans occasionally, wife at bedside and eager for treatment session.  Wife very concerned w/delay in session due to brief change expressing concern that first PT session consumed entirely by incontinence/her perception that pt "not ready" for PT.  Relayed concern to pt nurse. Pt found again to be incontinent in wet brief.  Therapist + nurse provided mod to max assist w/rolling, total assist for hygiene and brief change. Pt lifts feet w/mod assist for donning pants, max for rolling to raise pants.   Supine to sit w/max assist.  Pt mod assist for static sitting.  Sit to stand in Steady w/mod assist.  Worked on .perched sitting to standing x 4 in Steady.  Bed to wc via Steady transfer.   Pt transported to gym, wife accompanied pt to provide assist w/wc follow during gait training. Sit to stand w/max assist of 2.  Gait 60ft w/max of 2 via 3 muskateers style guarding.  Pt w/significant tone LUE and LLE but can initiate stepping on L, max assist to complete advancement, limb relatively stable thru stance due to  tone,   Progresses RLE w/occasional min assist to complete, very short step length Following several min seated rest, attempted second trial of gait, but w/attempted Sit to stand pt not able to initiate task despite heavy multimodal cueing.   Pt transported to room.  Wc to bed via Steady transfer as above.  Sit to supine max assit of 2.  Total assist of 2 for scooting, repositioning, and donning clean shirt per wife request. Pt left supine w/rails up x 3, alarm set, bed in lowest position, and needs in reach.    Therapy Documentation Precautions:  Precautions Precautions: Fall Precaution Comments: cortrak Restrictions Weight Bearing Restrictions: No   Therapy/Group: Individual Therapy Callie Fielding, [place] 08/17/2020, 12:41 PM

## 2020-08-17 NOTE — Progress Notes (Signed)
Speech Language Patholo Pgy Daily Session Note  Patient Details  Name: [name] [name] MRN: [id number] Date of Birth: 05/08/1940  Today's Date: 08/17/2020 SLP Individual Time: 1315-1330 SLP Individual Time Calculation (min): 15 min  Short Term Goals: Week 1: SLP Short Term Goal 1 (Week 1): Pt will consume therapeutic trials of ice chips and/or small amounts of water with minimal overt s/s of aspiration and max assist for use of swallowing precautions. SLP Short Term Goal 2 (Week 1): Pt will focus his attention to a targeted stimulus in >50% of opportunities with max assist multimodal cues. SLP Short Term Goal 3 (Week 1): Pt will maintain alertness for periods of >5 minutes to participate in basic ADLs with max assist multimodal cues. SLP Short Term Goal 4 (Week 1): Pt will use multimodal means of communication (gestures, head nod/shake, pointing, verbalizing, vocalizing) to convey his immediate needs and wants to caregivers with max assist multimodal cues.  Skilled Therapeutic Interventions:Skilled ST services focused on education. Pt initially missed 15 minutes of treatment due to nursing care and request for ST not to enter.  Pt's eyes were fluttering open when ST entered but unable to demonstrate focused attention. Pt was able to allow ST to complete oral care on surface of teeth but would not open oral cavity despite max A multimodal cues. Pt eventually fell asleep and cold compress/sternal rub were unsuccessful in initiating arousal. SLP was unable to continue or oral nor planned ice chip trials. SLP provided education to pt's wife pertaining types of automatic language tasks and cuing levels, she had mentioned noting a partial verbal greeting. All questions answered to satisfaction. Pt was left in room with call bell within reach and bed alarm set. SLP recommends to continue skilled services.     Pain Pain Assessment Pain Score: Asleep  Therapy/Group: Individual Therapy  Ethel Veronica   [facility] 08/17/2020, 3:55 PM

## 2020-08-17 NOTE — Progress Notes (Signed)
Occupational Therapy Session Note  Patient Details  Name: [name] [name] MRN: [id number] Date of Birth: December 03, 1940  Today's Date: 08/17/2020 OT Individual Time: 1415-1540 OT Individual Time Calculation (min): 85 min    Short Term Goals: Week 1:  OT Short Term Goal 1 (Week 1): Pt will attend to ADL task for 1 minute with mod cueing OT Short Term Goal 2 (Week 1): Pt will don shirt with max A OT Short Term Goal 3 (Week 1): Pt will use BUE to bathe UB with mod A  Skilled Therapeutic Interventions/Progress Updates:    Patient in bed, wife present for session.   Patient is non-verbal but does not appear to be in any distress.  He appears sleepy but alert with stimulation and able to follow basic one step directions, Yes/No responses with head nods approx 50% and occ gestures to indicate needs such as wiping nose and mouth.  Able to attend to stimulation at midline with max cues but does not attend to left side.  Eyes grossly symmetrical, no squinting with attempts to read or inspect objects.  Incontinent of bladder x2 during session requiring changing of clothing at bed level - he assists with washing peri area when handed wash cloth - dependent for buttocks and to place clean brief.  Rolling in bed max A both directions.  Dependent to donn slipper socks, max A/dep to donn shorts and pull over hips.   Side lying to sitting edge of bed with max A.  Mod A to maintain unsupported sitting.  Sit to stand in stedy mod/max A.  Utilized stedy to TIS w/c.  Patient with hand to mouth appears to be trying to brush teeth - prepped suction toothbrush and he initiated oral care with [facility] to ensure suction.  Trials of reaching to target at midline without success - he randomly reaches for objects on the right side and attempts to use them appropriately.  Holds pen and makes marks on paper but difficult to assess ability to recognize/read.  Holds and manipulates objects in right hand, holds items in left hand but unable to  sustain.  Returned to bed via stedy, sit to supine max A (completed 2nd brief change).  Patient remained in bed at close of session - sound asleep by the time bed was adjusted, mitts on and linens cleaned up.  HOB >30, bed alarm set.    Therapy Documentation Precautions:  Precautions Precautions: Fall Precaution Comments: cortrak Restrictions Weight Bearing Restrictions: No  Therapy/Group: Individual Therapy  Carlos Levering 08/17/2020, 7:46 AM

## 2020-08-17 NOTE — Progress Notes (Signed)
CT head ordered for follow up prior to decision on [facility] per discussion with Dr. Erlinda Hong. Await results to decide on timing of medication initiation.

## 2020-08-17 NOTE — Progress Notes (Signed)
Pharmacy: Dofetilide (Tikosyn) - Follow Up Assessment and Electrolyte Replacement  Pharmacy consulted to assist in monitoring and replacing electrolytes in this 80 y.o. male admitted on 08/15/2020 continuing dofetilide.  Labs:    Component Value Date/Time   K 3.6 08/17/2020 0340   K 4.1 11/26/2013 0000   MG 1.9 08/17/2020 0340     Plan: Potassium: K 3.5-3.7:  Give KCl 60 mEq po x1   Magnesium: Mg 1.8-2: Give Mg 2 gm IV x1    Thank you for allowing pharmacy to participate in this patient's care.  Renold Genta, PharmD, BCPS Clinical Pharmacist Clinical phone for 08/17/2020 until 3p is [phone] 08/17/2020 12:49 PM  **Pharmacist phone directory can be found on [email or web address] listed under [place]**

## 2020-08-17 NOTE — Progress Notes (Signed)
Physical Therapy Session Note  Patient Details  Name: [name] [name] MRN: [id number] Date of Birth: August 25, 1940  Today's Date: 08/17/2020 PT Individual Time: 0800-0830 PT Individual Time Calculation (min): 30 min   Short Term Goals: Week 1:  PT Short Term Goal 1 (Week 1): Pt will perform supine<>sit with mod-max A of 1 person consistently. PT Short Term Goal 2 (Week 1): Pt will perform sit<>stands using LRAD with mod A +2 PT Short Term Goal 3 (Week 1): Pt will perform bed<>chair transfers using LRAD with max A of 1 person PT Short Term Goal 4 (Week 1): Patient will initiation gait training PT Short Term Goal 5 (Week 1): Patient will perform sitting balance with mod A consistently  Skilled Therapeutic Interventions/Progress Updates:    Pt received seated in bed, somewhat lethargic and non-verbal throughout session. Paused feed during therapy session and restarted at end of session. Removed R mitt and placed wet wash cloth in hand, with cues and some hand-over-hand assist patient able to wash his face and appears somewhat more awake. Attempted to assist patient with donning pants however pt found to be incontinent of urine due to condom cath not being fully in place. Pt's brief and bed linens soiled with urine. Rolling L/R with max to total A x 2 for dependent brief change and pericare. Nursing aware that condom cath needs to be replaced, not available to reattach during session. Pt left seated in bed with B hand mitts in place, wife present, bed alarm in place.  Therapy Documentation Precautions:  Precautions Precautions: Fall Precaution Comments: cortrak Restrictions Weight Bearing Restrictions: No    Therapy/Group: Individual Therapy   Excell Seltzer, PT, DPT, CSRS  08/17/2020, 12:02 PM

## 2020-08-17 NOTE — Progress Notes (Signed)
Initial Nutrition Assessment  DOCUMENTATION CODES:  Not applicable  INTERVENTION:  Change TF to: Jevity 1.5 at 55 ml/h (1320 ml per day) Prosource TF 45 ml TID Provides 2100 kcal, 115 gm protein, 1005 ml free water daily  NUTRITION DIAGNOSIS:  Inadequate oral intake related to inability to eat as evidenced by NPO status.  GOAL:  Patient will meet greater than or equal to 90% of their needs  MONITOR:  TF tolerance, Diet advancement, Labs, Weight trends, I & O's  REASON FOR ASSESSMENT:  New TF    ASSESSMENT:  80 yo male with a PMH of diastolic CHF, AVR/MVR, A-fib on coumadin/Tiksoyn who was admitted on 5/23 for difficulty speaking and R sided weakness after a fall the day before. 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 He was intubated for airway protection and family elected on DNR with comfort care due to poor prognosis.  However, patient started showing ability to follow commands on 5/24 with repeat CT showing persistent bilateral SDH.  He was taken to the OR for right craniotomy for evacuation of SDH. He tolerated extubation without difficulty but was limited by left greater than right-sided weakness with visual deficits, strong posterior bias as well as poor safety awareness with cognitive deficits.  He was admitted to rehab on 5/27 for intensive rehab program to consist of PT OT and speech therapy. 6/6 - cortrak placed - tip gastric  Current TF order: Osmolite 1.5 at 55 ml/h (1320 ml per day) Prosource TF 45 ml TID Provides 2100 kcal, 115 gm protein, 1005 ml free water daily  Spoke with family at bedside. She reports that pt has been having loose stools, so she was wondering if pt could try a different TF formula. She reports that pt takes fiber supplement at home.  RD to change formula to Jevity 1.5 to help with loose stools. Recommendations in intervention above.  Pt with minimal depletions on exam.  Medications: reviewed; SSI,  Protonix, miralax, Senokot BID  Labs: reviewed; CBG 131-173  NUTRITION - FOCUSED PHYSICAL EXAM: Flowsheet Row Most Recent Value  Orbital Region No depletion  Upper Arm Region Mild depletion  Thoracic and Lumbar Region No depletion  Buccal Region No depletion  Temple Region No depletion  Clavicle Bone Region Mild depletion  Clavicle and Acromion Bone Region Moderate depletion  Scapular Bone Region No depletion  Dorsal Hand No depletion  Patellar Region No depletion  Anterior Thigh Region No depletion  Posterior Calf Region Mild depletion  Edema (RD Assessment) Mild  [Generalized]  Hair Reviewed  Eyes Reviewed  Mouth Reviewed  Skin Reviewed  Nails Reviewed   Diet Order:   Diet Order             Diet NPO time specified  Diet effective now                  EDUCATION NEEDS:  Education needs have been addressed  Skin:  Skin Assessment: Reviewed RN Assessment  Last BM:  08/16/20 - Type 6, medium  Height:  Ht Readings from Last 1 Encounters:  08/17/20 5\' 7"  (1.702 m)   Weight:  Wt Readings from Last 1 Encounters:  08/15/20 90.9 kg   Ideal Body Weight:  67.3 kg  BMI:  Body mass index is 31.39 kg/m.  Estimated Nutritional Needs:  Kcal:  2000-2200 Protein:  115-230 grams Fluid:  >2 L  Derrel Nip, RD, LDN Registered Dietitian I After-Hours/Weekend Pager # in [place]

## 2020-08-17 NOTE — Progress Notes (Signed)
PROGRESS NOTE   Subjective/Complaints: Patient opens eyes and makes contact which wife says is better than weekend. Getting Amantadine 17m BID. Wife would like him to work with his prior therapy team whom she feels knows him very well.    ROS: Limited by sedation/cognition  Objective:   No results found. Recent Labs    08/16/20 0427 08/17/20 0340  WBC 7.9 7.9  HGB 9.0* 8.7*  HCT 28.2* 27.5*  PLT 177 205   Recent Labs    08/16/20 0427 08/17/20 0340  NA 140 137  K 3.8 3.6  CL 109 106  CO2 26 26  GLUCOSE 160* 145*  BUN 29* 23  CREATININE 0.69 0.74  CALCIUM 8.0* 8.0*    Intake/Output Summary (Last 24 hours) at 08/17/2020 1214 Last data filed at 08/17/2020 0401 Gross per 24 hour  Intake --  Output 1075 ml  Net -1075 ml        Physical Exam: Vital Signs Blood pressure 118/66, pulse (!) 58, temperature 97.6 F (36.4 C), temperature source Oral, resp. rate 20, height _0  (1.702 m), SpO2 98 %. Gen: no distress, normal appearing HEENT: oral mucosa pink and moist, NCAT Cardio: Bradycardia Chest: normal effort, normal rate of breathing Abd: soft, non-distended Ext: no edema Psych: pleasant, normal affect  Genitourinary:    Comments: Condom catheter placed- medium amber urine noted Musculoskeletal:    Cervical back: Normal range of motion and neck supple.    Comments: Responded to tickling- B/L in feet Grabbed our hands on R, not well on L No other compliance with MS exam  Skin:    Comments: Some ecchymoses on arms B/L - no backside breakdown  Neurological:    Mental Status: He is lethargic.    Comments: Non-verbal except for grunting sounds. Left facial weakness with puffing breaths. Right in attention? Does move all four with mac tactile/verbal cues. Was completely asleep- woke very briefly- wearing mittens B/L  Psychiatric:    Comments: sedated     Assessment/Plan: 1. Functional deficits  which require 3+ hours per day of interdisciplinary therapy in a comprehensive inpatient rehab setting. Physiatrist is providing close team supervision and 24 hour management of active medical problems listed below. Physiatrist and rehab team continue to assess barriers to discharge/monitor patient progress toward functional and medical goals  Care Tool:  Bathing        Body parts bathed by helper: Right arm, Left arm, Chest, Right lower leg, Left lower leg, Face, Abdomen, Front perineal area, Buttocks, Right upper leg, Left upper leg     Bathing assist Assist Level: Dependent - Patient 0%     Upper Body Dressing/Undressing Upper body dressing   What is the patient wearing?: Pull over shirt    Upper body assist Assist Level: Dependent - Patient 0%    Lower Body Dressing/Undressing Lower body dressing      What is the patient wearing?: Incontinence brief     Lower body assist Assist for lower body dressing: 2 Helpers     Toileting Toileting    Toileting assist Assist for toileting: 2 Helpers     Transfers Chair/bed transfer  Transfers assist  Chair/bed transfer assist level: Dependent - Patient 0% Charlaine Dalton)     Locomotion Ambulation   Ambulation assist   Ambulation activity did not occur: Safety/medical concerns          Walk 10 feet activity   Assist  Walk 10 feet activity did not occur: Safety/medical concerns        Walk 50 feet activity   Assist Walk 50 feet with 2 turns activity did not occur: Safety/medical concerns         Walk 150 feet activity   Assist Walk 150 feet activity did not occur: Safety/medical concerns         Walk 10 feet on uneven surface  activity   Assist Walk 10 feet on uneven surfaces activity did not occur: Safety/medical concerns         Wheelchair     Assist     Wheelchair activity did not occur: Safety/medical concerns         Wheelchair 50 feet with 2 turns  activity    Assist    Wheelchair 50 feet with 2 turns activity did not occur: Safety/medical concerns       Wheelchair 150 feet activity     Assist  Wheelchair 150 feet activity did not occur: Safety/medical concerns       Blood pressure 118/66, pulse (!) 58, temperature 97.6 F (36.4 C), temperature source Oral, resp. rate 20, height _0  (1.702 m), SpO2 98 %.  Medical Problem List and Plan: 1.  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  Antithrombotics: -DVT/anticoagulation:  SQ Lovenox             -antiplatelet therapy: ASA 325 mg/day 3. Pain Management: tylenol prn 4. Mood: LCSW to follow for evaluation and support.              -antipsychotic agents: N/A 5. Neuropsych: This patient is not capable of making decisions on his own behalf. 6. Skin/Wound Care: Routine pressure relief measures.  7. Fluids/Electrolytes/Nutrition: NPO with tube feeds.             -water flushes 175 cc every 4 hours 8. HTN: Monitor BP tid with SBP goal <140/90.             --continue losartan/day, cardura/day and Metoprolol every 6 hours  6/12- BP is borderline high- con't nitor for trend before changing meds.  9. T2DM: Hgb A1C- 6.2. Monitor BS evr  6/12- BG 120s-160s -con't regimen for now.  10. Lethargy: On Amantadine since 06/09 for activation. Add 25m Ritalin daily.  11. PAF: Monitor HR tid--question DOAC 10 days post stroke (On 06/13?). --SDH post fall 07/27/20             --continue Tikosyn bid             --Will consult pharmacy to help keep to keep K+>4.0/Mg levels supplemented. 12. [facility] 13. Induced hypernatremia: Improving with increase water flushes  6/13- Down to 137- continue to monitor 14. Hypokalemia: Question dilutional-- pharmacy assisting  with management.   15. Urinary incontinence-  6/13 continue condom cath per pt's wife- gets agitated if gets wet at all-  LOS: 2 days A FACE TO FACE EVALUATION WAS PERFORMED  Clide Deutscher Rane Dumm 08/17/2020, 12:14 PM

## 2020-08-17 NOTE — Progress Notes (Signed)
PMR Admission Coordinator Pre-Admission Assessment  Patient: [name] [name] is an 80 y.o., male MRN: [id number] DOB: 12/25/1940 Height: 5' 7" (170.2 cm) Weight: 91.8 kg  Insurance Information HMO:     PPO:      PCP:      IPA:      80/20:      OTHER:  PRIMARY: Medicare A and B      Policy#: [id number]            Subscriber:  CM Name:       Phone#:      Fax#:  Pre-Cert#:       Employer:  Benefits:  Phone #:      Name:  Eff. Date: A 09/05/07, B 03/07/08     Deduct: $1556      Out of Pocket Max: n/a      Life Max: n/a CIR: 100%      SNF: 20 full days Outpatient: 80%     Co-Ins: 20% Home Health: 100%      Co-Pay:  DME: 80%     Co-Ins: 20% Providers:  SECONDARY: BCBS Supplemental       Policy#: [id number]     Phone#:   Financial Counselor:       Phone#:   The "Data Collection Information Summary" for patients in Inpatient Rehabilitation Facilities with attached "Privacy Act Statement-Health Care Records" was provided and verbally reviewed with: Patient and Family  Emergency Contact Information Contact Information     Name Relation Home Work Mobile   Kail,Agnes S Spouse   [phone]   Guardia,Joseph Son [phone]     Douse, Mack Son   [phone]       Current Medical History  Patient Admitting Diagnosis: SDH s/p crani 5/24 and CVA on 6/3 History of Present Illness: [name] [name] is a 80 y.o. right-handed male with history of rheumatic heart disease, atrial fibrillation, diastolic congestive heart failure, aortic valve replacement, mitral valve replacement, dysrhythmia maintained on Tikosyn, prediabetes.  Pt had a fall at church on 5/22 with positive loss of consciousness.  Cranial CT scan initially negative on presentation to ED on 5/22 so he was discharged home.  On 5/23 pt developed AMS and patient decompensated quickly with EMS.  Presented to [facility] again on 5/23 with follow-up CT showing acute right much larger than left subdural hematomas.  Additional small volume  subdural hemorrhage along the falx and tentorium.  Mass-effect including subfalcine herniation with leftward midline shift of 1.8 cm and early trapping of the left lateral ventricle.  Initially pt without corneal or gag reflex, and posturing with poor prognosis.  Overnight, pt improved significantly with return of reflexes and following commands.  Patient underwent right craniotomy for evacuation of subdural hematoma 07/28/2020 per Dr. Rodarius Jones.  He was extubated 07/29/2020.  Decadron protocol as indicated.  Maintained on Keppra for seizure prophylaxis.  Tolerating a regular diet.  [facility] and progressing well with therapies.  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  Code stroke was initiated.  Pt transferred back to acute and underwent revascularization per Dr. Deveshwar on 6/3.  Therapy evaluations were completed and pt was again recommended for CIR.    Complete NIHSS TOTAL: 24  Patient's medical record from [facility] has been reviewed by the rehabilitation admission coordinator and physician.    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  Prior Rehab/Hospitalizations Has the patient had prior rehab or hospitalizations prior to admission? Yes  Has the patient had major surgery during 100 days prior to admission? Yes   Current Medications  Current Facility-Administered Medications:    acetaminophen (TYLENOL) tablet 650 mg, 650 mg, Oral, Q4H PRN **OR** acetaminophen (TYLENOL) 160 MG/5ML solution 650 mg, 650 mg, Per Tube, Q4H PRN **OR** acetaminophen (TYLENOL) suppository 650 mg, 650 mg, Rectal, Q4H PRN, Bhagat, Srishti L, MD   amantadine (SYMMETREL) capsule 100 mg, 100 mg, Per Tube, BID, Barr, Grace E, RPH, 100 mg at 08/14/20 0637   aspirin tablet 325 mg, 325 mg, Per Tube, Daily, Hammons, Kimberly B, RPH, 325 mg at 08/14/20 1027   atorvastatin (LIPITOR) tablet 80 mg, 80 mg, Per Tube, QPM, Bhagat, Srishti L, MD, 80 mg at 08/13/20 1742   chlorhexidine (PERIDEX) 0.12 % solution 15 mL, 15 mL, Mouth Rinse, BID, Sethi, Pramod S, MD, 15 mL at 08/14/20 1029   chlorhexidine gluconate (MEDLINE KIT) (PERIDEX) 0.12 % solution 15 mL, 15 mL, Mouth Rinse, BID, Bhagat, Srishti L, MD, 15 mL at 08/14/20 1027   Chlorhexidine Gluconate Cloth 2 % PADS 6 each, 6 each, Topical, Daily, Agarwala, Ravi, MD, 6 each at 08/14/20 1029   dofetilide (TIKOSYN) capsule 500 mcg, 500 mcg, Per Tube, BID, Bhagat, Srishti L, MD, 500 mcg at 08/14/20 1205   doxazosin (CARDURA) tablet 2 mg, 2 mg, Per Tube, Daily, Bhagat, Srishti L, MD, 2 mg at 08/14/20 1026   enoxaparin (LOVENOX) injection 40 mg, 40 mg, Subcutaneous, Daily, Al Masry, Mahmoud, MD, 40 mg at 08/14/20 1028   feeding  supplement (OSMOLITE 1.5 CAL) liquid 1,000 mL, 1,000 mL, Per Tube, Continuous, Chand, Sudham, MD, Last Rate: 55 mL/hr at 08/13/20 1900, 1,000 mL at 08/13/20 1900   feeding supplement (PROSource TF) liquid 45 mL, 45 mL, Per Tube, TID, Chand, Sudham, MD, 45 mL at 08/14/20 1028   fentaNYL (SUBLIMAZE) injection 25 mcg, 25 mcg, Intravenous, Q15 min PRN, Bhagat, Srishti L, MD   fentaNYL (SUBLIMAZE) injection 25-100 mcg, 25-100 mcg, Intravenous, Q30 min PRN, Bhagat, Srishti L, MD, 50 mcg at 08/11/20 1302   free water 175 mL, 175 mL, Per Tube, Q4H,   Pokhrel, Laxman, MD, 175 mL at 08/14/20 1159   hydrALAZINE (APRESOLINE) injection 20 mg, 20 mg, Intravenous, Q6H PRN, Sethi, Pramod S, MD   insulin aspart (novoLOG) injection 0-9 Units, 0-9 Units, Subcutaneous, Q4H, Bhagat, Srishti L, MD, 1 Units at 08/14/20 1206   labetalol (NORMODYNE) injection 20-40 mg, 20-40 mg, Intravenous, Q2H PRN, Sethi, Pramod S, MD   losartan (COZAAR) tablet 25 mg, 25 mg, Per Tube, Daily, Reese, Stephanie M, PA-C, 25 mg at 08/14/20 1027   MEDLINE mouth rinse, 15 mL, Mouth Rinse, q12n4p, Sethi, Pramod S, MD, 15 mL at 08/14/20 1159   metoprolol tartrate (LOPRESSOR) 25 mg/10 mL oral suspension 12.5 mg, 12.5 mg, Per Tube, Q6H, Bhagat, Srishti L, MD, 12.5 mg at 08/14/20 1205   pantoprazole sodium (PROTONIX) 40 mg/20 mL oral suspension 40 mg, 40 mg, Per Tube, Daily, Bowser, Grace E, NP, 40 mg at 08/14/20 1028   polyethylene glycol (MIRALAX / GLYCOLAX) packet 17 g, 17 g, Per Tube, Daily, Bhagat, Srishti L, MD, 17 g at 08/13/20 1041   senna-docusate (Senokot-S) tablet 1 tablet, 1 tablet, Per Tube, QHS PRN, Bhagat, Srishti L, MD, 1 tablet at 08/13/20 2038   senna-docusate (Senokot-S) tablet 1 tablet, 1 tablet, Per Tube, BID, Hoffman, Paul W, NP, 1 tablet at 08/13/20 2227   sodium chloride flush (NS) 0.9 % injection 10-40 mL, 10-40 mL, Intracatheter, Q12H, Bowser, Grace E, NP, 10 mL at 08/14/20 1025   sodium chloride flush (NS) 0.9 % injection  10-40 mL, 10-40 mL, Intracatheter, PRN, Bowser, Grace E, NP, 10 mL at 08/09/20 2157   sodium phosphate (FLEET) 7-19 GM/118ML enema 1 enema, 1 enema, Rectal, Once, Davis, Whitney F, NP  Patients Current Diet:  Diet Order             Diet NPO time specified  Diet effective now                   Precautions / Restrictions Precautions Precautions: Fall Precaution Comments: cortrak, R mitten Restrictions Weight Bearing Restrictions: No   Has the patient had 2 or more falls or a fall with injury in the past year? 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 Independent  Indoor Mobility: Did the patient need assistance with walking from room to room (with or without device)? Needed some help  Stairs: Did the patient need assistance with internal or external stairs (with or without device)? Independent  Functional Cognition: Did the patient need help planning regular tasks such as shopping or remembering to take medications? Needed some help  Home Assistive Devices / Equipment Home Assistive Devices/Equipment: None  Prior Device Use: Indicate devices/aids used by the patient prior to current illness, exacerbation or injury?  cane  Current Functional Level Cognition  Arousal/Alertness: Lethargic Overall Cognitive Status: Impaired/Different from baseline Difficult to assess due to: Level of arousal Current Attention Level: Alternating Orientation Level: Disoriented X4 Following Commands: Follows one step commands inconsistently, Follows one step commands with increased time Safety/Judgement: Decreased awareness of safety, Decreased awareness of deficits General Comments: Pt very lethargic, minimally opening eyes briefly with stimulation in supine. Opened eyes and maintained them open when positioned in  sitting. Pt needing increased time to process and follow single step multi-modal commands inconsistently, especially with his L side. Poor awareness into his deficits and safety as he tends to not   attend to his L side and unable to cross midline with his eyes towards his L. Pt also impulsively reaching towards his cortrak as the session progressed, needing cues to avoid intermittently. Attention: Sustained Focused Attention: Impaired Focused Attention Impairment: Functional basic, Verbal basic Sustained Attention: Impaired Sustained Attention Impairment: Verbal basic, Functional basic Memory:  (will continue to assess) Awareness: Impaired Awareness Impairment: Intellectual impairment, Emergent impairment, Anticipatory impairment Problem Solving: Impaired Problem Solving Impairment: Functional basic Safety/Judgment: Impaired    Extremity Assessment (includes Sensation/Coordination)  Upper Extremity Assessment: LUE deficits/detail LUE Deficits / Details: 4 / 5 grossly when activated. pt will spontaneously use arm but can not get to follow command. pt reaching for wife with L hand during session LUE Coordination: decreased fine motor, decreased gross motor  Lower Extremity Assessment: Defer to PT evaluation LLE Deficits / Details: Spontaneous movement of L lower extremity in supine and sitting against gravity, activating his L leg muscles for functional mobility but having difficulty following commands LLE Coordination: decreased gross motor    ADLs  Overall ADL's : Needs assistance/impaired Eating/Feeding: NPO General ADL Comments: Pt require (A) for all adls at this time    Mobility  Overal bed mobility: Needs Assistance Bed Mobility: Supine to Sit Supine to sit: +2 for physical assistance, +2 for safety/equipment, Total assist, HOB elevated Sit to supine: Mod assist, +2 for physical assistance, +2 for safety/equipment, HOB elevated General bed mobility comments: Pt lethargic,  minimally opening eyes and not initiating when supine, thus TAx2 for bed mobility.    Transfers  Overall transfer level: Needs assistance Equipment used: 2 person hand held assist Transfers: Sit to/from Stand, Stand Pivot Transfers Sit to Stand: Max assist, +2 physical assistance, +2 safety/equipment, From elevated surface Stand pivot transfers: Max assist, +2 physical assistance, +2 safety/equipment General transfer comment: x2 sit <> stand from elevated EOB with bil HHA, achieving full upright standing posture with maxAx2 at buttocks and intermittent bil knee block for safety. MaxAx2 to stand step to R with pt initiating small R foot steps but not L.    Ambulation / Gait / Stairs / Wheelchair Mobility  Ambulation/Gait Ambulation/Gait assistance: Max assist, +2 physical assistance, +2 safety/equipment Gait Distance (Feet): 1 Feet Assistive device: 2 person hand held assist Gait Pattern/deviations: Step-to pattern, Decreased step length - left, Decreased stride length, Decreased weight shift to right, Decreased weight shift to left, Decreased dorsiflexion - right, Decreased dorsiflexion - left, Trunk flexed General Gait Details: Pt taking small staggering steps with R foot but not initiating L to side step to the R for transfer to the recliner with maxAx2 and bil HHA. Gait velocity: reduced Gait velocity interpretation: <1.31 ft/sec, indicative of household ambulator    Posture / Balance Dynamic Sitting Balance Sitting balance - Comments: 1-2 UE support with min-modA support at trunk. Balance Overall balance assessment: Needs assistance Sitting-balance support: Feet supported, Single extremity supported, Bilateral upper extremity supported Sitting balance-Leahy Scale: Poor Sitting balance - Comments: 1-2 UE support with min-modA support at trunk. Postural control: Left lateral lean, Posterior lean, Other (comment) (eventually anterior lean sitting EOB as pt fatigued) Standing balance  support: Bilateral upper extremity supported Standing balance-Leahy Scale: Poor Standing balance comment: MaxAx2 and bil UE support to stand.    Special needs/care consideration Continuous Drip IV  tube feeds and Diabetic management yes   Previous Home Environment (from acute therapy documentation) Living Arrangements: Spouse/significant other  Lives With: Spouse Available Help at Discharge: Family, Available 24 hours/day Type of Home: House   Home Layout: One level Home Access: Level entry Bathroom Shower/Tub: Walk-in shower Bathroom Toilet: Standard Home Care Services: No Additional Comments: Pt has a shower bench built-in to the side of the handheld shower head. x1 vertical grab bar at site of handheld shower head.  Discharge Living Setting Plans for Discharge Living Setting: Other (Comment) (ILF) Type of Home at Discharge: Independent living facility Care Facility Name at Discharge: [facility] Discharge Home Layout: One level Discharge Home Access: Level entry Discharge Bathroom Toilet: Handicapped height Discharge Bathroom Accessibility: Yes How Accessible: Accessible via walker Does the patient have any problems obtaining your medications?: No  Social/Family/Support Systems Patient Roles: Spouse Anticipated Caregiver: spouse Agnes Hernon, and sons Joseph and Mack Anticipated Caregiver's Contact Information: Agnes [phone]; Joseph [phone]; Mack [phone] Ability/Limitations of Caregiver: n/a Caregiver Availability: 24/7 Discharge Plan Discussed with Primary Caregiver: Yes Is Caregiver In Agreement with Plan?: Yes Does Caregiver/Family have Issues with Lodging/Transportation while Pt is in Rehab?: No  Goals Patient/Family Goal for Rehab: PT/OT/SLP supervision to min assist Expected length of stay: 18-21 days Pt/Family Agrees to Admission and willing to participate: Yes Program Orientation Provided & Reviewed with Pt/Caregiver Including Roles  &  Responsibilities: Yes  Barriers to Discharge: Other (comments) (level of assist)  Decrease burden of Care through IP rehab admission: n/a  Possible need for SNF placement upon discharge: Not anticipated.   Patient Condition: I have reviewed medical records from [facility], spoken with CM, and patient and spouse. I met with patient at the bedside for inpatient rehabilitation assessment.  Patient will benefit from ongoing PT, OT, and SLP, can actively participate in 3 hours of therapy a day 5 days of the week, and can make measurable gains during the admission.  Patient will also benefit from the coordinated team approach during an Inpatient Acute Rehabilitation admission.  The patient will receive intensive therapy as well as Rehabilitation physician, nursing, social worker, and care management interventions.  Due to bladder management, bowel management, safety, skin/wound care, disease management, medication administration, pain management, and patient education the patient requires 24 hour a day rehabilitation nursing.  The patient is currently max +2 with mobility and basic ADLs.  Discharge setting and therapy post discharge at home with home health is anticipated.  Patient has agreed to participate in the Acute Inpatient Rehabilitation Program and will admit Saturday, 6/11.  Preadmission Screen Completed By:  Annalaura Sauseda E Arriel Victor, PT, DPT 08/14/2020 2:58 PM ______________________________________________________________________   Discussed status with Dr. Lovorn on 08/14/20  at 3:03 PM  and received approval for admission today.  Admission Coordinator:  Janaiah Vetrano E Harald Quevedo, PT, DPT time 3:04 PM /Date 08/14/20   Assessment/Plan: Diagnosis: Does the need for close, 24 hr/day Medical supervision in concert with the patient's rehab needs make it unreasonable for this patient to be served in a less intensive setting? Yes Co-Morbidities requiring supervision/potential complications: Afib, dCHF, pre-DM,  rheumatic heart diease; R>>L SDH's s/p crani; valve replacements and new R MCA stroke Due to bladder management, bowel management, safety, skin/wound care, disease management, medication administration, pain management, and patient education, does the patient require 24 hr/day rehab nursing? Yes Does the patient require coordinated care of a physician, rehab nurse, PT, OT, and SLP to address physical and functional deficits in the context of the above medical diagnosis(es)? Yes Addressing deficits in the following areas: balance, endurance, locomotion, strength, transferring, bowel/bladder control, bathing, dressing, feeding, grooming, toileting, cognition, speech, and swallowing Can the patient actively participate in an intensive therapy program of at least   3 hrs of therapy 5 days a week? Yes The potential for patient to make measurable gains while on inpatient rehab is good Anticipated functional outcomes upon discharge from inpatient rehab: supervision and min assist PT, supervision and min assist OT, supervision and min assist SLP Estimated rehab length of stay to reach the above functional goals is: 18-21 days Anticipated discharge destination: Home 10. Overall Rehab/Functional Prognosis: good   MD Signature:  

## 2020-08-17 NOTE — Plan of Care (Signed)
  Problem: RH BOWEL ELIMINATION Goal: RH STG MANAGE BOWEL WITH ASSISTANCE Description: STG Manage Bowel with Min Assistance. Outcome: Not Progressing Goal: RH STG MANAGE BOWEL W/MEDICATION W/ASSISTANCE Description: STG Manage Bowel with Medication with Rosine. Outcome: Not Progressing   Problem: RH BLADDER ELIMINATION Goal: RH STG MANAGE BLADDER WITH ASSISTANCE Description: STG Manage Bladder With min Assistance Outcome: Not Progressing Goal: RH STG MANAGE BLADDER WITH MEDICATION WITH ASSISTANCE Description: STG Manage Bladder With Medication With min Assistance. Outcome: Not Progressing

## 2020-08-17 NOTE — Progress Notes (Signed)
[facility] Individual Statement of Services  Patient Name:  [name] [name]  Date:  08/17/2020  Welcome to the [facility].  Our goal is to provide you with an individualized program based on your diagnosis and situation, designed to meet your specific needs.  With this comprehensive rehabilitation program, you will be expected to participate in at least 3 hours of rehabilitation therapies Monday-Friday, with modified therapy programming on the weekends.  Your rehabilitation program will include the following services:  Physical Therapy (PT), Occupational Therapy (OT), Speech Therapy (ST), 24 hour per day rehabilitation nursing, Therapeutic Recreaction (TR), Neuropsychology, Care Coordinator, Rehabilitation Medicine, Nutrition Services, Pharmacy Services, and Other  Weekly team conferences will be held on Tuesdays to discuss your progress.  Your Inpatient Rehabilitation Care Coordinator will talk with you frequently to get your input and to update you on team discussions.  Team conferences with you and your family in attendance may also be held.  Expected length of stay: 18-21 Days  Overall anticipated outcome: Supervision to Min A  Depending on your progress and recovery, your program may change. Your Inpatient Rehabilitation Care Coordinator will coordinate services and will keep you informed of any changes. Your Inpatient Rehabilitation Care Coordinator's name and contact numbers are listed  below.  The following services may also be recommended but are not provided by the [facility]:   [place] will be made to provide these services after discharge if needed.  Arrangements include referral to agencies that provide these services.  Your insurance has been verified to be:  Medicare and [employer]  Your primary doctor is:  Scarlette Calico,  MD  Pertinent information will be shared with your doctor and your insurance company.  Inpatient Rehabilitation Care Coordinator:  Cathleen Corti [phone] or ([phone]  Information discussed with and copy given to patient by: Dyanne Iha, 08/17/2020, 11:28 AM

## 2020-08-17 NOTE — Progress Notes (Signed)
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   External hemorrhoids without mention of complication    H/O aortic valve replacement    H/O mitral valve replacement    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   Lesion of ulnar nerve    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    Other and unspecified hyperlipidemia    Pre-diabetes    Previous back surgery 1978, jan 2007   Psychosexual dysfunction with inhibited sexual excitement    SOB (shortness of breath)    "with heavy exercise"   Stroke ([place]) 08/2013   "I WAS IN AFIB AND THREW A CLOT, THE EFFECTS WERE TRANSITORY AND DIDNT LAST BUT FOR 30 MINUTES"    Thoracic aortic aneurysm [facility])    Past Surgical History:  Past Surgical History:  Procedure Laterality Date   AORTIC AND MITRAL VALVE REPLACEMENT     09/2004   CARDIOVERSION     3 times from 2004-2006   CARDIOVERSION N/A 09/26/2013   Procedure: CARDIOVERSION;  Surgeon: Larey Dresser, MD;  Location: [place];  Service: Cardiovascular;  Laterality: N/A;   CARDIOVERSION N/A 06/19/2014   Procedure: CARDIOVERSION;  Surgeon: Jerline Pain, MD;  Location: [place];  Service: Cardiovascular;  Laterality: N/A;   CARDIOVERSION N/A 04/24/2017   Procedure: CARDIOVERSION;  Surgeon: Larey Dresser, MD;  Location: [place];  Service: Cardiovascular;  Laterality: N/A;   CARDIOVERSION N/A 06/08/2017   Procedure: CARDIOVERSION;  Surgeon: Pixie Casino, MD;   Location: [facility] ENDOSCOPY;  Service: Cardiovascular;  Laterality: N/A;   CARDIOVERSION N/A 08/24/2017   Procedure: CARDIOVERSION;  Surgeon: Skeet Latch, MD;  Location: [facility] ENDOSCOPY;  Service: Cardiovascular;  Laterality: N/A;   CARDIOVERSION N/A 02/14/2018   Procedure: CARDIOVERSION;  Surgeon: Larey Dresser, MD;  Location: [facility] ENDOSCOPY;  Service: Cardiovascular;  Laterality: N/A;   COLONOSCOPY     CRANIOTOMY N/A 07/28/2020   Procedure: CRANIOTOMY FOR EVACUATION OF SUBDURAL HEMATOMA;  Surgeon: Eustace Band, MD;  Location: [place];  Service: Neurosurgery;  Laterality: N/A;   IR ANGIO VERTEBRAL SEL SUBCLAVIAN INNOMINATE UNI R MOD SED  08/07/2020   IR CT HEAD LTD  08/07/2020   IR PERCUTANEOUS ART THROMBECTOMY/INFUSION INTRACRANIAL INC DIAG ANGIO  08/07/2020   laminectomies     10/1975 and in 03/2005   RADIOLOGY WITH ANESTHESIA N/A 08/07/2020   Procedure: IR WITH ANESTHESIA;  Surgeon: Luanne Bras, MD;  Location: [place];  Service: Radiology;  Laterality: N/A;   TONSILLECTOMY     1950   TOTAL HIP ARTHROPLASTY Right 04/03/2018   Procedure: TOTAL HIP ARTHROPLASTY ANTERIOR APPROACH;  Surgeon: Paralee Cancel, MD;  Location: [facility];  Service: Orthopedics;  Laterality: Right;  70 mins   Social History:  reports that he has never smoked. He has never used smokeless tobacco. He reports that he does not drink alcohol and does not use drugs.  Family / Support Systems Marital Status:  Married Patient Roles: Spouse Spouse/Significant Other: Agnes Children: Broadus John (Son), Warner Mccreedy (Son) Anticipated Caregiver: Gwenyth Bender, MAck Ability/Limitations of Caregiver: n/a Caregiver Availability: 24/7 Family Dynamics: Very supportive family  Social History Preferred language: English Religion: Baptist Read: Yes Write: Yes Employment Status: Retired Biochemist, clinical) Public relations account executive Issues: n/a Guardian/Conservator: Beverely Low   Abuse/Neglect Abuse/Neglect Assessment Can Be Completed:  Yes Physical Abuse: Denies Verbal Abuse: Denies Sexual Abuse: Denies Exploitation of patient/patient's resources: Denies Self-Neglect: Denies  Emotional Status Recent Psychosocial Issues: n/a Psychiatric History: n/a Substance Abuse History: n/a  Patient / Family Perceptions, Expectations & Goals Pt/Family understanding of illness & functional limitations: yes Premorbid pt/family roles/activities: Independent with ADL care, assistance with mobility and cognition Anticipated changes in roles/activities/participation: Family able to assist Pt/family expectations/goals: Supervision to [place]: None Premorbid Home Care/DME Agencies: None Transportation available at discharge: Family able to transport Resource referrals recommended: Neuropsychology  Discharge Planning Living Arrangements: Spouse/significant other Support Systems: Spouse/significant other, Children Type of Residence: Independent Living Environmental education officer (ILF), 1 level, level entry) Insurance Resources: [employer], Multimedia programmer (specify) (BCBS supplement) Museum/gallery curator Resources: Fish farm manager, SSD Living Expenses: Rent Money Management: Patient, Spouse Does the patient have any problems obtaining your medications?: No Home Management: Assistance from spouse Patient/Family Preliminary Plans: spouse able to assist Care Coordinator Barriers to Discharge: IV antibiotics Care Coordinator Anticipated Follow Up Needs: ALF/IL, HH/OP Expected length of stay: 18-21 Days  Clinical Impression Covering for primary SW, Auria C.  SW met with patient and spouse in room. Spouse report patient has been admitted previously and familiar with Auria. Introduced self, addressed questions and concerns. Pt and spouse currently live in [place] and plan to d/c back, will make determination based on patient progress. No additional questions or concerns, sw will cont to follow up.  Dyanne Iha 08/17/2020, 12:42 PM

## 2020-08-18 DIAGNOSIS — G936 Cerebral edema: Secondary | ICD-10-CM

## 2020-08-18 DIAGNOSIS — S065X9A Traumatic subdural hemorrhage with loss of consciousness of unspecified duration, initial encounter: Secondary | ICD-10-CM

## 2020-08-18 LAB — BASIC METABOLIC PANEL
Anion gap: 7 (ref 5–15)
BUN: 19 mg/dL (ref 8–23)
CO2: 26 mmol/L (ref 22–32)
Calcium: 8.4 mg/dL — ABNORMAL LOW (ref 8.9–10.3)
Chloride: 106 mmol/L (ref 98–111)
Creatinine, Ser: 0.73 mg/dL (ref 0.61–1.24)
GFR, Estimated: >60 mL/min (ref >60)
Glucose, Bld: 141 mg/dL — ABNORMAL HIGH (ref 70–99)
Potassium: 4 mmol/L (ref 3.5–5.1)
Sodium: 139 mmol/L (ref 135–145)

## 2020-08-18 LAB — GLUCOSE, CAPILLARY
Glucose-Capillary: 121 mg/dL — ABNORMAL HIGH (ref 70–99)
Glucose-Capillary: 124 mg/dL — ABNORMAL HIGH (ref 70–99)
Glucose-Capillary: 127 mg/dL — ABNORMAL HIGH (ref 70–99)
Glucose-Capillary: 132 mg/dL — ABNORMAL HIGH (ref 70–99)
Glucose-Capillary: 136 mg/dL — ABNORMAL HIGH (ref 70–99)
Glucose-Capillary: 163 mg/dL — ABNORMAL HIGH (ref 70–99)

## 2020-08-18 LAB — MAGNESIUM: Magnesium: 2.2 mg/dL (ref 1.7–2.4)

## 2020-08-18 MED ORDER — FREE WATER
175.0000 mL | Status: DC
  Administered 2020-08-19 (×4): 175 mL

## 2020-08-18 MED ORDER — POTASSIUM CHLORIDE 20 MEQ PO PACK
20.0000 meq | PACK | Freq: Every day | ORAL | Status: DC
  Administered 2020-08-18 – 2020-08-20 (×3): 20 meq
  Filled 2020-08-18 (×3): qty 1

## 2020-08-18 NOTE — Progress Notes (Signed)
Speech Language Pathology Daily Session Note  Patient Details  Name: [name] [name] MRN: [id number] Date of Birth: 03-May-1940  Today's Date: 08/18/2020 SLP Individual Time: 1345-1430 SLP Individual Time Calculation (min): 45 min  Short Term Goals: Week 1: SLP Short Term Goal 1 (Week 1): Pt will consume therapeutic trials of ice chips and/or small amounts of water with minimal overt s/s of aspiration and max assist for use of swallowing precautions. SLP Short Term Goal 2 (Week 1): Pt will focus his attention to a targeted stimulus in >50% of opportunities with max assist multimodal cues. SLP Short Term Goal 3 (Week 1): Pt will maintain alertness for periods of >5 minutes to participate in basic ADLs with max assist multimodal cues. SLP Short Term Goal 4 (Week 1): Pt will use multimodal means of communication (gestures, head nod/shake, pointing, verbalizing, vocalizing) to convey his immediate needs and wants to caregivers with max assist multimodal cues.  Skilled Therapeutic Interventions: Skilled treatment session focused on cognitive and dysphagia goals. Upon arrival, patient was upright in the tilt-in-space wheelchair and appeared lethargic. SLP performed oral care with Mod A verbal and tactile cues to maintain the suction with patient moving the suction toothbrush throughout his oral cavity. Patient self-fed 2 ice chips with hand over hand assist and was able to orally manipulate and masticate one ice chip. The second ice chip had to be suctioned from this oral cavity, suspect due to fatigue. SLP attempted to increase arousal by playing familiar music with minimal success. Due to lethargy, patient was transferred back to bed via the [facility] with +2 assist for safety and Mod-Max verbal and tactile cues needed for initiation of movement. Patient answered basic yes/no questions in 25% of opportunities but did answer some open ended questions appropriately in ~10% of opportunities. Patient's voice was low  in intensity with a wet vocal quality. Patient unable to increase vocal intensity despite cues. Patient's wife present and educated regarding current goals of care. Patient left upright in bed with alarm on, all needs within reach and wife present. Continue with current plan of care.      Pain No/Denies Pain   Therapy/Group: Individual Therapy  Deneisha Dade 08/18/2020, 3:12 PM

## 2020-08-18 NOTE — IPOC Note (Signed)
Overall Plan of Care [facility]) Patient Details Name: [name] [name] MRN: [id number] DOB: 11-Sep-1940  Admitting Diagnosis: Acute ischemic right MCA stroke [facility])  Hospital Problems: Principal Problem:   Acute ischemic right MCA stroke [facility])     Functional Problem List: Nursing Behavior, Bladder, Bowel, Edema, Endurance, Medication Management, Nutrition, Pain, Safety  PT Balance, Perception, Safety, Behavior, Edema, Sensory, Skin Integrity, Endurance, Motor, Nutrition, Pain  OT Balance, Perception, Behavior, Safety, Cognition, Sensory, Skin Integrity, Edema, Endurance, Vision, Motor, Nutrition, Pain  SLP Cognition, Nutrition, Linguistic  TR         Basic ADL's: OT Eating, Grooming, Dressing, Toileting, Bathing     Advanced  ADL's: OT       Transfers: PT Bed Mobility, Bed to Chair, Car, Manufacturing systems engineer, Metallurgist: PT Ambulation, Emergency planning/management officer, Stairs     Additional Impairments: OT Fuctional Use of Upper Extremity  SLP Swallowing, Communication, Social Cognition comprehension, expression Attention  TR      Anticipated Outcomes Item Anticipated Outcome  Self Feeding set up assist  Swallowing  min assist   Basic self-care  min A  Toileting  min A   Bathroom Transfers min A  Bowel/Bladder  Min assist  Transfers  min A using LRAD  Locomotion  max A using LRAD  Communication  min assist  Cognition  min assist  Pain  <3  Safety/Judgment  Min assist with cues, reminders and no falls   Therapy Plan: PT Intensity: Minimum of 1-2 x/day ,45 to 90 minutes PT Frequency: 5 out of 7 days PT Duration Estimated Length of Stay: 3-4 weeks OT Intensity: Minimum of 1-2 x/day, 45 to 90 minutes OT Frequency: 5 out of 7 days OT Duration/Estimated Length of Stay: 3-4 weeks SLP Intensity: Minumum of 1-2 x/day, 30 to 90 minutes SLP Frequency: 3 to 5 out of 7 days SLP Duration/Estimated Length of Stay: 21-28 days   Due to the current state of  emergency, patients may not be receiving their 3-hours of Medicare-mandated therapy.   Team Interventions: Nursing Interventions Patient/Family Education, Bladder Management, Bowel Management, Disease Management/Prevention, Pain Management, Medication Management, Dysphagia/Aspiration Precaution Training, Cognitive Remediation/Compensation, Discharge Planning, Psychosocial Support  PT interventions Ambulation/gait training, Community reintegration, DME/adaptive equipment instruction, Neuromuscular re-education, Psychosocial support, Stair training, UE/LE Strength taining/ROM, Training and development officer, Functional electrical stimulation, Discharge planning, Pain management, Skin care/wound management, Therapeutic Activities, UE/LE Coordination activities, Cognitive remediation/compensation, Disease management/prevention, Functional mobility training, Patient/family education, Splinting/orthotics, Therapeutic Exercise, Visual/perceptual remediation/compensation, Wheelchair propulsion/positioning  OT Interventions Training and development officer, Cognitive remediation/compensation, Academic librarian, Engineer, drilling, Discharge planning, Functional electrical stimulation, Functional mobility training, Neuromuscular re-education, Patient/family education, Pain management, Self Care/advanced ADL retraining, Therapeutic Activities, UE/LE Coordination activities, Therapeutic Exercise, UE/LE Strength taining/ROM, Visual/perceptual remediation/compensation, Wheelchair propulsion/positioning  SLP Interventions Cognitive remediation/compensation, Cueing hierarchy, Dysphagia/aspiration precaution training, Internal/external aids, Speech/Language facilitation, Environmental controls, Multimodal communication approach, Patient/family education  TR Interventions    SW/CM Interventions Discharge Planning, Psychosocial Support, Patient/Family Education, Disease Management/Prevention   Barriers to  Discharge MD  Medical stability  Nursing Decreased caregiver support, Home environment access/layout, Incontinence, Lack of/limited family support, Weight, Medication compliance, Behavior, Nutrition means Independent living at [place], 1 level, level entry, handicap unit  PT Incontinence, Neurogenic Bowel & Bladder, Behavior    OT      SLP      SW IV antibiotics     Team Discharge Planning: Destination: PT-Home ,OT- Home , SLP-Home Projected Follow-up: PT-Home health PT, Skilled nursing facility (may need skilled depending  on patient progress), OT-  Home health OT, 24 hour supervision/assistance, SLP-24 hour supervision/assistance, Home Health SLP Projected Equipment Needs: PT-To be determined, OT- To be determined, SLP-To be determined Equipment Details: PT- , OT-  Patient/family involved in discharge planning: PT- Family member/caregiver,  OT-Family member/caregiver, SLP-Patient, Family member/caregiver  MD ELOS: 18-21 days Min-ModA Medical Rehab Prognosis:  Excellent Assessment: [name] [name] is a 80 year old man admitted to CIR with lethargy and L hemiparesis with cognitive impairments secondary to R MCA stroke s/p previous/recent crani for R>>L SDHs. Medications are being adjusted, vitals and labs monitored regularly.    See Team Conference Notes for weekly updates to the plan of care

## 2020-08-18 NOTE — Progress Notes (Signed)
Pharmacy: Dofetilide (Tikosyn) - Follow Up Assessment and Electrolyte Replacement  Pharmacy consulted to assist in monitoring and replacing electrolytes in this 80 y.o. male admitted on 08/15/2020 continuing dofetilide. Pharmacy consulted to keep potassium above 4 and magnesium above 2.   Labs:    Component Value Date/Time   K 4.0 08/18/2020 0428   K 4.1 11/26/2013 0000   MG 2.2 08/18/2020 0428     Plan: Potassium: K 4.0 - after receiving an average to 50 mEq daily in the last 5 days. Discussed with MD, agreed with scheduling Kcl 20 mEq daily   Magnesium:  Mg 2.2, no repletion   F/u K and Mg on 6/16    Thank you for allowing pharmacy to participate in this patient's care.  Benetta Spar, PharmD, BCPS, BCCP Clinical Pharmacist  Please check AMION for all [place] phone numbers After 10:00 PM, call [place] [phone]

## 2020-08-18 NOTE — Patient Care Conference (Signed)
Inpatient RehabilitationTeam Conference and Plan of Care Update Date: 08/18/2020   Time: 10:03 AM    Patient Name: [name] [name]      Medical Record Number: [id number]  Date of Birth: [birth year] Sex: Male         Room/Bed: 4M11C/4M11C-01 Payor Info: Payor: MEDICARE / Plan: MEDICARE PART A AND B / Product Type: *No Product type* /    Admit Date/Time:  08/15/2020  2:18 PM  Primary Diagnosis:  Acute ischemic right MCA stroke [facility])  Hospital Problems: Principal Problem:   Acute ischemic right MCA stroke [facility])    Expected Discharge Date: Expected Discharge Date:  (3-4 Weeks)  Team Members Present: Physician leading conference: Dr. Leeroy Cha Care Coodinator Present: Erlene Quan, BSW;Shahida Schnackenberg Creig Hines, RN, BSN, CRRN Nurse Present: [name], RN PT Present: Apolinar Junes, PT OT Present: Laverle Hobby, OT SLP Present: Weston [place], SLP PPS Coordinator present : Ileana Ladd, PT     Current Status/Progress Goal Weekly Team Focus  Bowel/Bladder   Incontinent Bowel and Bladder. Using condom cath for comfort  Become continent of bowel and bladder  Assess q shift. Start q2h timed toileting once patient more alert   Swallow/Nutrition/ Hydration   NPO with cortrak  Min A  arousal, management of secretions, ice chip and water trials after oral care   ADL's   Max-total A for all ADLs. Limited by initiation, attention, arousal. Tone in LUE/LLE  min A overall  ADLs, cognitive retraining, transfers, initiation, attention to task   Mobility   max A overall, Stedy transfers, max +2 gait x15 ft  Min A overall  Arousal, attention, initiation, functional mobility, activity tolerance, balance, L inattention, L side NMR, gait training, patient/caregiver education   Communication   Total A, nonverbal and will occasionaly use gestures  MIn A  multimodal communication, automatic language tasks   Safety/Cognition/ Behavioral Observations  Max-Total A  Min A  arousal, focused  attention, initiation   Pain   No distress noted. Patient nonverbal  pain<3 on faces scale  Assess for signs of discomfort/distress q shift and PRN   Skin   Surgical incision to head OTA/scabbed. Blister to bottom left buttocks-foam. Prophylactic foam to sacrum.  Skin remain free of breakdown  Assess skin q shift and PRN     Discharge Planning:  Patient and spouse plan to return to ILF pending patient progress. Additional assistance is avaliable   Team Discussion: Started 5mg  Ritalin, will continue to titrate it up if it works. Will continue the condom cath but switch over to the [place] cath. Patient is incontinent of B/B, no complaints of pain, no sleep issues. Blister to the left buttock, scab to the left hip, crani site OTA. Patient on target to meet rehab goals: yes, big improvement with OT, overall max/total assist. Mod assist today with upper body dressing. Has min assist goals. Struggling with arousal, attention, and initiation. Performed STS with PT yesterday with min assist and walked 15 ft. Patient is NPO with a cortrak in place. Working on [employer] and water trails after oral care.  *See Care Plan and progress notes for long and short-term goals.   Revisions to Treatment Plan:  MD to continue Amantadine and Ritalin.  Teaching Needs: Family education, medication management, skin/wound care, transfer training, gait training, balance training, endurance training, safety awareness.  Current Barriers to Discharge: Decreased caregiver support, Medical stability, Home enviroment access/layout, Incontinence, Lack of/limited family support, Weight, Medication compliance, Behavior, and Nutritional means  Possible Resolutions to  Barriers: Continue current medications, provide emotional support.     Medical Summary Current Status: Restless, urinary incontinence, hypokalemia, hyponatremia, impaired arousal and initiation, dysphagia, hypernatremia, pressure injury  Barriers to Discharge:  Medical stability  Barriers to Discharge Comments: Restless, urinary incontinence, hypokalemia, hyponatremia, impaired arousal and initiation, dysphagia, hypernatremia, pressure injury Possible Resolutions to [employer] Focus: Continue Amantadine and Ritalin and uptitrate as tolerated, continue condom cath, continue to monitor potassium and sodium levels with increased free water flushes, wound monitoring   Continued Need for Acute Rehabilitation Level of Care: The patient requires daily medical management by a physician with specialized training in physical medicine and rehabilitation for the following reasons: Direction of a multidisciplinary physical rehabilitation program to maximize functional independence : Yes Medical management of patient stability for increased activity during participation in an intensive rehabilitation regime.: Yes Analysis of laboratory values and/or radiology reports with any subsequent need for medication adjustment and/or medical intervention. : Yes   I attest that I was present, lead the team conference, and concur with the assessment and plan of the team.   Cristi Loron 08/18/2020, 12:10 PM

## 2020-08-18 NOTE — Progress Notes (Signed)
Occupational Therapy Session Note  Patient Details  Name: [name] [name] MRN: [id number] Date of Birth: 1940-12-10  Today's Date: 08/18/2020 OT Individual Time: 0906-1000 OT Individual Time Calculation (min): 54 min   Session 2: OT Individual Time: 1245-1345 OT Individual Time Calculation (min): 60 min    Short Term Goals: Week 1:  OT Short Term Goal 1 (Week 1): Pt will attend to ADL task for 1 minute with mod cueing OT Short Term Goal 2 (Week 1): Pt will don shirt with max A OT Short Term Goal 3 (Week 1): Pt will use BUE to bathe UB with mod A  Skilled Therapeutic Interventions/Progress Updates:    Pt received supine with no c/o pain. Pt found to have BM smear so brief was changed dependently at bed level. Pt required max A +2 to roll R and L in bed. He was able to maintain grasp on the bedrail during rolling with max facilitation. Total A +2 transfer to EOB. Max A to maintain sitting balance EOB. Pt more restless this session, reaching forward to grab/hold anything within reach if his hands were not preoccupied. Pt completed sit > stand in the stedy with mod A once he understood the motor plan to stand. He remained perched in the stedy during UB ADLs at the sink. Frequent cueing for attention to task, pt was able to briefly focus attention on task, but unable to sustain with multimodal cueing. He did demonstrate more automatic initiation and carryover of motor planning during ADLs- washing his chest and under arms with only mod cueing. Min facilitation required at the LUE when reaching across body. Pt completed oral care with great carryover of motor plan, difficulty terminating. Noticed pt to be incontinent of urine (condom cath fell off) and pants now soiled. He completed several sit <> stands in the stedy with mod A for OT to change brief. Pt was returned to supine so condom cath could be re-donned. Wife present, all needs met.    Session 2:  Pt received supine, mostly non-verbal  throughout session. He completed rolling R and L with max cueing for initiation but pt able to participate functionally with max A to roll. Pt completed sit > stand with mod +2 assist using the RW. He began automatically taking steps, mod +2 for 20 ft of functional mobility with the RW, occasional assist required to facilitate L swing through. BM odor became evident and pt was incontinent upon check. Pt completed a stand pivot transfer to the [facility] over the toilet with max + 2 assist. He required frequent redirection for restless hands and for upright posture. Pt completed sit > stand with max A for total A peri hygiene. This took an extended amount of time as pt required frequent redirection to task, max facilitation for upright posture and for proper hand placement. Used stedy to transfer pt back to TIS w/c. Total A required to scoot hip back in the chair. Pt was passed off to SLP in room.    Therapy Documentation Precautions:  Precautions Precautions: Fall Precaution Comments: cortrak Restrictions Weight Bearing Restrictions: No   Therapy/Group: Individual Therapy  Curtis Sites 08/18/2020, 7:27 AM

## 2020-08-18 NOTE — Progress Notes (Signed)
PROGRESS NOTE   Subjective/Complaints: Patient much more responsive this morning. Says "How are you?" "Thank you" Makes good eye contact. Continue amantadine and ritaline Team conference today   ROS: Limited by sedation/cognition  Objective:   CT HEAD WO CONTRAST  Result Date: 08/17/2020 CLINICAL DATA:  Stroke follow-up EXAM: CT HEAD WITHOUT CONTRAST TECHNIQUE: Contiguous axial images were obtained from the base of the skull through the vertex without intravenous contrast. COMPARISON:  08/12/2020 FINDINGS: Brain: Large, recent right MCA territory infarct is unchanged. There is slightly increased leftward midline shift measuring 7 mm. Small right hemispheric subdural hematoma is unchanged in thickness. No new site of hemorrhage. Hypodensity in the posterior left parietal lobe is unchanged. Vascular: No abnormal hyperdensity of the major intracranial arteries or dural venous sinuses. No intracranial atherosclerosis. Skull: Recent right sided craniotomy. Sinuses/Orbits: No fluid levels or advanced mucosal thickening of the visualized paranasal sinuses. No mastoid or middle ear effusion. The orbits are normal. IMPRESSION: 1. Slightly increased leftward midline shift measuring 7 mm. Otherwise unchanged appearance of right MCA territory infarct. 2. Unchanged small right hemispheric subdural hematoma. Electronically Signed   By: Ulyses Jarred M.D.   On: 08/17/2020 21:05   Recent Labs    08/16/20 0427 08/17/20 0340  WBC 7.9 7.9  HGB 9.0* 8.7*  HCT 28.2* 27.5*  PLT 177 205   Recent Labs    08/17/20 0340 08/18/20 0428  NA 137 139  K 3.6 4.0  CL 106 106  CO2 26 26  GLUCOSE 145* 141*  BUN 23 19  CREATININE 0.74 0.73  CALCIUM 8.0* 8.4*    Intake/Output Summary (Last 24 hours) at 08/18/2020 0908 Last data filed at 08/18/2020 0744 Gross per 24 hour  Intake 1389.95 ml  Output 1550 ml  Net -160.05 ml        Physical Exam: Vital  Signs Blood pressure 135/75, pulse 62, temperature (!) 97.5 F (36.4 C), resp. rate 17, height _0  (1.702 m), SpO2 99 %. Gen: no distress, normal appearing HEENT: oral mucosa pink and moist, NCAT Cardio: Reg rate Chest: normal effort, normal rate of breathing Abd: soft, non-distended Ext: no edema Psych: pleasant, normal affect Genitourinary:    Comments: Condom catheter placed- medium amber urine noted Musculoskeletal:    Cervical back: Normal range of motion and neck supple.    Comments: Responded to tickling- B/L in feet Grabbed our hands on R, not well on L No other compliance with MS exam  Skin:    Comments: Some ecchymoses on arms B/L - no backside breakdown  Neurological:    Mental Status: He is lethargic.    Comments: Non-verbal except for grunting sounds. Left facial weakness with puffing breaths. Right in attention? Does move all four with mac tactile/verbal cues. Was completely asleep- woke very briefly- wearing mittens B/L  Psychiatric:    Comments: sedated     Assessment/Plan: 1. Functional deficits which require 3+ hours per day of interdisciplinary therapy in a comprehensive inpatient rehab setting. Physiatrist is providing close team supervision and 24 hour management of active medical problems listed below. Physiatrist and rehab team continue to assess barriers to discharge/monitor patient progress toward functional and medical goals  Care Tool:  Bathing        Body parts bathed by helper: Right arm, Left arm, Chest, Right lower leg, Left lower leg, Face, Abdomen, Front perineal area, Buttocks, Right upper leg, Left upper leg     Bathing assist Assist Level: Dependent - Patient 0%     Upper Body Dressing/Undressing Upper body dressing   What is the patient wearing?: Hospital gown only    Upper body assist Assist Level: Dependent - Patient 0%    Lower Body Dressing/Undressing Lower body dressing      What is the patient wearing?: Pants,  Incontinence brief     Lower body assist Assist for lower body dressing: Total Assistance - Patient < 25%     Toileting Toileting    Toileting assist Assist for toileting: Dependent - Patient 0%     Transfers Chair/bed transfer  Transfers assist     Chair/bed transfer assist level: Dependent - Patient 0% (Steady)     Locomotion Ambulation   Ambulation assist   Ambulation activity did not occur: Safety/medical concerns  Assist level: 2 helpers Assistive device: No Device Max distance: 15   Walk 10 feet activity   Assist  Walk 10 feet activity did not occur: Safety/medical concerns  Assist level: 2 helpers Assistive device: No Device   Walk 50 feet activity   Assist Walk 50 feet with 2 turns activity did not occur: Safety/medical concerns         Walk 150 feet activity   Assist Walk 150 feet activity did not occur: Safety/medical concerns         Walk 10 feet on uneven surface  activity   Assist Walk 10 feet on uneven surfaces activity did not occur: Safety/medical concerns         Wheelchair     Assist     Wheelchair activity did not occur: Safety/medical concerns         Wheelchair 50 feet with 2 turns activity    Assist    Wheelchair 50 feet with 2 turns activity did not occur: Safety/medical concerns       Wheelchair 150 feet activity     Assist  Wheelchair 150 feet activity did not occur: Safety/medical concerns       Blood pressure 135/75, pulse 62, temperature (!) 97.5 F (36.4 C), resp. rate 17, height _0  (1.702 m), SpO2 99 %.  Medical Problem List and Plan: 1.  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   Impaired mobility -DVT/anticoagulation:  Continue SQ Lovenox             -antiplatelet therapy: ASA 325 mg/day 3. Pain Management: tylenol prn 4. Mood: LCSW to follow for evaluation and support.              -antipsychotic agents: N/A 5. Neuropsych: This patient is not capable of making decisions on his own behalf. 6. Skin/Wound Care: Routine pressure relief measures.  7. Fluids/Electrolytes/Nutrition: NPO with tube feeds.             -water flushes 175 cc every 4 hours 8. HTN: Monitor BP tid  with SBP goal <140/90.             --continue losartan/day, cardura/day and Metoprolol every 6 hours 9. T2DM: Hgb A1C- 6.2. Continue checking CBGs and ISS 10. Lethargy: On Amantadine since 06/09 for activation. Add 62m Ritalin daily--appears to have helped.  11. PAF: Monitor HR tid--question DOAC 10 days post stroke (On 06/13?). --SDH post fall 07/27/20             --continue Tikosyn bid             --Will consult pharmacy to help keep to keep K+>4.0/Mg levels supplemented. 12. [facility] 13. Induced hypernatremia: Improving with increase water flushes  6/13- Down to 137- continue to monitor 14. Hypokalemia: Resolved, monitor weekly  15. Urinary incontinence-  6/14 continue condom cath per pt's wife- gets agitated if gets wet at all-         LOS: 3 days A FACE TO FACE EVALUATION WAS PHarold6/14/2022, 9:08 AM

## 2020-08-18 NOTE — Progress Notes (Signed)
Patient ID: [name] [name], male   DOB: [birth year], 80 y.o.   MRN: [id number] Team Conference Report to Patient/Family  Team Conference discussion was reviewed with the patient and caregiver, including goals, any changes in plan of care and target discharge date.  Patient and caregiver express understanding and are in agreement.  The patient has a target discharge  date of  (3-4 Weeks).  overing for primary SW, Auria   SW met with patient and spouse, introduced self, provided patient with updates. Spouse informed patient would be provided a d/c date on conference next Tuesday. No additional questions or concerns   Dyanne Iha 08/18/2020, 1:50 PM

## 2020-08-18 NOTE — Progress Notes (Signed)
Physical Therapy Session Note  Patient Details  Name: [name] [name] MRN: [id number] Date of Birth: 11-Feb-1941  Today's Date: 08/18/2020 PT Individual Time: 1030-1130 PT Individual Time Calculation (min): 60 min   Short Term Goals: Week 1:  PT Short Term Goal 1 (Week 1): Pt will perform supine<>sit with mod-max A of 1 person consistently. PT Short Term Goal 2 (Week 1): Pt will perform sit<>stands using LRAD with mod A +2 PT Short Term Goal 3 (Week 1): Pt will perform bed<>chair transfers using LRAD with max A of 1 person PT Short Term Goal 4 (Week 1): Patient will initiation gait training PT Short Term Goal 5 (Week 1): Patient will perform sitting balance with mod A consistently  Skilled Therapeutic Interventions/Progress Updates:     Patient in bed with his wife at bedside upon PT arrival. Patient alert and agreeable to PT session. Patient showed no outward signs of pain throughout session. Patient able to verbalize "yes" x3 and "comb," with cues for increased volume. Attempted to have patient name items (clock, sock) without success, and asked patient to say his name several times without success as well, noted significant external and internal distraction limiting initiation with speech and mobility throughout session.   Patient's wife reported that the patient's condom catheter had come off earlier this morning. RN made aware and applied a new condom cath at beginning of session. Donned patient's pants with total A with patient lifting each leg with multiple cues for patient to lift his L leg off the bed. Patient performed bridging x3 to pull up pant, even used his L hand to pull his pants up on the L side.  Therapeutic Activity: Bed Mobility: Patient performed rolling R/L with max A with heavy cue for initiation and attending to the L. He performed supine to sit with mod-max A and sit to supine with min-mod A +2 for trunk and lower extremity management. Provided max verbal cues for  initiation and sequencing. Transfers: Patient performed sit to/from stand x1 with max A +2, patient unable to activate his L leg to push to full knee/hip extension. Provided verbal cues for forward weight shift and hip/knee/trunk extension. Performed squat pivot bed>TIS w/c with max-total A +2 to the L with significant motor planning challenges to pivot. Performed Stedy transfer TIS w/c>bed with mod A of 1 person with a second person CGA for safety.   Neuromuscular Re-ed: Patient performed the following sitting balance activities: -sitting EOB >8 min progressing from max A to min/mod A with L lean, focused on midline orientation and L attention with multimodal cues, increased L lean with attention on a secondary task, wiping his nose, looking ahead, looking to the L, patient able to look past midline to the L x2, but unable to sustain  Patient in sitting up in the bed with his wife at bedside and NT in the room at end of session with breaks locked, bed alarm set, and all needs within reach.   Therapy Documentation Precautions:  Precautions Precautions: Fall Precaution Comments: cortrak Restrictions Weight Bearing Restrictions: No    Therapy/Group: Individual Therapy  Ying Blankenhorn L Honi Name PT, DPT  08/18/2020, 12:28 PM

## 2020-08-18 NOTE — Consult Note (Addendum)
Stroke Neurology Consultation Note  Consult Requested by: Dr. Naaman Plummer  Reason for Consult: Anticoagulation Timing   Consult Date:  08/18/20  History of Present Illness:  [name] [name] is an 80 y.o. w/pmh of atrial fibrillation on warfarin, aortic valve replacement, mitral valve replacement, acute rheumatic pericarditis (at age 45/7), stroke (2015, secondary to A. 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 The etiology of his stroke is afib with interruption of AC due to recent traumatic SDH.   Neurology has been consulted for recommendations regarding when to initiate anticoagulation.    Past Surgical History:  Procedure Laterality Date   AORTIC AND MITRAL VALVE REPLACEMENT     09/2004   CARDIOVERSION     3 times from 2004-2006   CARDIOVERSION N/A 09/26/2013   Procedure: CARDIOVERSION;  Surgeon: Larey Dresser, MD;  Location: [employer];  Service: Cardiovascular;  Laterality: N/A;   CARDIOVERSION N/A 06/19/2014   Procedure: CARDIOVERSION;  Surgeon: Jerline Pain, MD;  Location: [place];  Service: Cardiovascular;  Laterality: N/A;   CARDIOVERSION N/A 04/24/2017   Procedure: CARDIOVERSION;  Surgeon: Larey Dresser, MD;  Location: [place];  Service: Cardiovascular;  Laterality: N/A;   CARDIOVERSION N/A 06/08/2017   Procedure: CARDIOVERSION;  Surgeon: Pixie Casino, MD;  Location: [facility] ENDOSCOPY;  Service: Cardiovascular;  Laterality: N/A;   CARDIOVERSION N/A 08/24/2017   Procedure: CARDIOVERSION;  Surgeon: Skeet Latch, MD;  Location: [facility] ENDOSCOPY;  Service: Cardiovascular;  Laterality: N/A;   CARDIOVERSION N/A 02/14/2018   Procedure: CARDIOVERSION;  Surgeon: Larey Dresser, MD;  Location: [facility] ENDOSCOPY;  Service: Cardiovascular;  Laterality:  N/A;   COLONOSCOPY     CRANIOTOMY N/A 07/28/2020   Procedure: CRANIOTOMY FOR EVACUATION OF SUBDURAL HEMATOMA;  Surgeon: Eustace Nudelman, MD;  Location: [place];  Service: Neurosurgery;  Laterality: N/A;   IR ANGIO VERTEBRAL SEL SUBCLAVIAN INNOMINATE UNI R MOD SED  08/07/2020   IR CT HEAD LTD  08/07/2020   IR PERCUTANEOUS ART THROMBECTOMY/INFUSION INTRACRANIAL INC DIAG ANGIO  08/07/2020   laminectomies     10/1975 and in 03/2005   RADIOLOGY WITH ANESTHESIA N/A 08/07/2020   Procedure: IR WITH ANESTHESIA;  Surgeon: Luanne Bras, MD;  Location: [place];  Service: Radiology;  Laterality: N/A;   TONSILLECTOMY     1950   TOTAL HIP ARTHROPLASTY Right 04/03/2018   Procedure: TOTAL HIP ARTHROPLASTY ANTERIOR APPROACH;  Surgeon: Paralee Cancel, MD;  Location: [facility];  Service: Orthopedics;  Laterality: Right;  70 mins    Family History  Problem Relation Age of Onset   Macular degeneration Mother        macular degeneration   Cancer Father        intestinal/GI   Diabetes Paternal Aunt    Heart attack Maternal Grandfather    Diabetes Brother      Social History:  reports that he has never smoked. He has never used smokeless tobacco. He reports that he does not drink alcohol and does not use drugs.  Review of Systems: ROS negative except for HPI  Allergies:  Allergies  Allergen Reactions   Naproxen Hives   No [employer] Food Allergies Other (See Comments)    Scallops - distress, nausea and vomitting  Medications: I have reviewed the patient's current medications.  Physical Examination: Temp:  [97.5 F (36.4 C)-97.9 F (36.6 C)] 97.7 F (36.5 C) (06/14 1242) Pulse Rate:  [58-64] 58 (06/14 1242) Resp:  [16-18] 16 (06/14 1242) BP: (119-135)/(70-85) 130/70 (06/14 1242) SpO2:  [97 %-99 %] 97 % (06/14 1242)   Assessment:   [name] is a 80 y.o. male w/pmh of atrial fibrillation on warfarin, aortic valve replacement, mitral valve replacement, acute rheumatic pericarditis (at age  65/7), stroke (2015, secondary to A. fib no residual) who presents with R SDH + RMCA stroke.   Neurology has been consulted for recommendations regarding when to initiate anticoagulation for his atrial fibrillation.  Given repeat Cameron on 6/13 with slightly increased leftward midline shift measuring 7 mm recommend repeating a CTH on 08/24/20 to decide if it is ok to initiate anticoagulation.   Please reach out to neurology after [facility] and we will give further recommendations on starting anticoagulation.   Ruta Hinds, NP  Stroke Nurse Practitioner  Patient discussed with attending physician Dr. Princess Bruins day # 3   ATTENDING NOTE: I reviewed above note and agree with the assessment and plan. Pt was seen and examined.   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  Coumadin was on hold due to [place].  Patient improved well after surgery initially, however on 6 05/2020 patient was found to have altered mental status and left-sided weakness.  CT showed left MCA large infarct.  CTA head and neck showed right ICA and right M2 occlusion.  Status post thrombectomy with TICI2c reperfusion.  EF 60 to 65%.  LDL 56, A1c 6.2.  Patient has dysphagia status post core track placement and on tube feeding.  Given large infarct and recent SDH, we recommended to consider restart anticoagulation 10 days post stroke.  Patient was later admitted to [facility] for therapy.  We will called back on this patient for anticoagulation management yesterday.  Repeat CT head today showed slightly increased right to left midline shift at 7 mm, unchanged right MCA infarct and small right SDH.  On exam, no  family at bedside, patient lying in bed, eyes open, awake alert, still has expressive aphasia, no spontaneous speech, however able to follow simple commands.  Not able to name or repeat.  Still has right gaze preference, barely cross midline.  Not blinking to visual threat bilaterally, left mild facial droop. RUE at least 4/5, left upper extremity proximal 3+/5, distal wrist extension and finger movement 1-2/5.  Right lower extremity at least 3/5, left lower extremity 2/5 with pain summation.  Sensation on the coordination not corporative, gait not tested.  Patient neurologically stable with slow improvement over time.  However, CT still showing significant midline shift and small amount of SDH.  Although it has been 11 days post stroke, we still feel it is risky to resume anticoagulation at this time based on CT findings.  Recommend repeat CT next Monday and will revisit anticoagulation choices at that time.  Please call us back once CT repeat done.  Discussed with CIR PA Olin Hauser.   Rosalin Hawking, MD PhD Stroke Neurology 08/18/2020 7:51 PM    To contact Stroke  Continuity provider, please refer to [email or web address] After hours, contact General Neurology

## 2020-08-19 ENCOUNTER — Telehealth (HOSPITAL_COMMUNITY): Payer: Self-pay | Admitting: *Deleted

## 2020-08-19 DIAGNOSIS — S069X3A Unspecified intracranial injury with loss of consciousness of 1 hour to 5 hours 59 minutes, initial encounter: Secondary | ICD-10-CM

## 2020-08-19 DIAGNOSIS — S069X3S Unspecified intracranial injury with loss of consciousness of 1 hour to 5 hours 59 minutes, sequela: Secondary | ICD-10-CM

## 2020-08-19 DIAGNOSIS — I482 Chronic atrial fibrillation, unspecified: Secondary | ICD-10-CM

## 2020-08-19 DIAGNOSIS — R1312 Dysphagia, oropharyngeal phase: Secondary | ICD-10-CM

## 2020-08-19 LAB — GLUCOSE, CAPILLARY
Glucose-Capillary: 101 mg/dL — ABNORMAL HIGH (ref 70–99)
Glucose-Capillary: 119 mg/dL — ABNORMAL HIGH (ref 70–99)
Glucose-Capillary: 121 mg/dL — ABNORMAL HIGH (ref 70–99)
Glucose-Capillary: 121 mg/dL — ABNORMAL HIGH (ref 70–99)
Glucose-Capillary: 126 mg/dL — ABNORMAL HIGH (ref 70–99)
Glucose-Capillary: 135 mg/dL — ABNORMAL HIGH (ref 70–99)
Glucose-Capillary: 93 mg/dL (ref 70–99)

## 2020-08-19 MED ORDER — FREE WATER
100.0000 mL | Status: DC
  Administered 2020-08-19 – 2020-09-08 (×116): 100 mL

## 2020-08-19 MED ORDER — ALTEPLASE 2 MG IJ SOLR
2.0000 mg | Freq: Once | INTRAMUSCULAR | Status: AC
  Administered 2020-08-19: 2 mg
  Filled 2020-08-19: qty 2

## 2020-08-19 NOTE — Telephone Encounter (Signed)
Pts wife left vm stating pt is still in the hospital for stroke and is about to start rehab. The inpatient doctor is talking about restarting warfarin and wife wants to discuss this with Dr.McLean.   Routed to [place]

## 2020-08-19 NOTE — Progress Notes (Signed)
PROGRESS NOTE   Subjective/Complaints: Pt alert, had a reasonable night. Lying in bed, confused.   ROS: Limited due to cognitive/behavioral     Objective:   CT HEAD WO CONTRAST  Result Date: 08/17/2020 CLINICAL DATA:  Stroke follow-up EXAM: CT HEAD WITHOUT CONTRAST TECHNIQUE: Contiguous axial images were obtained from the base of the skull through the vertex without intravenous contrast. COMPARISON:  08/12/2020 FINDINGS: Brain: Large, recent right MCA territory infarct is unchanged. There is slightly increased leftward midline shift measuring 7 mm. Small right hemispheric subdural hematoma is unchanged in thickness. No new site of hemorrhage. Hypodensity in the posterior left parietal lobe is unchanged. Vascular: No abnormal hyperdensity of the major intracranial arteries or dural venous sinuses. No intracranial atherosclerosis. Skull: Recent right sided craniotomy. Sinuses/Orbits: No fluid levels or advanced mucosal thickening of the visualized paranasal sinuses. No mastoid or middle ear effusion. The orbits are normal. IMPRESSION: 1. Slightly increased leftward midline shift measuring 7 mm. Otherwise unchanged appearance of right MCA territory infarct. 2. Unchanged small right hemispheric subdural hematoma. Electronically Signed   By: Ulyses Jarred M.D.   On: 08/17/2020 21:05   Recent Labs    08/17/20 0340  WBC 7.9  HGB 8.7*  HCT 27.5*  PLT 205   Recent Labs    08/17/20 0340 08/18/20 0428  NA 137 139  K 3.6 4.0  CL 106 106  CO2 26 26  GLUCOSE 145* 141*  BUN 23 19  CREATININE 0.74 0.73  CALCIUM 8.0* 8.4*    Intake/Output Summary (Last 24 hours) at 08/19/2020 0843 Last data filed at 08/19/2020 0730 Gross per 24 hour  Intake 1370 ml  Output 2250 ml  Net -880 ml        Physical Exam: Vital Signs Blood pressure 126/76, pulse 64, temperature 98.6 F (37 C), resp. rate 18, height 5\' 7"  (1.702 m), SpO2 100  %. Constitutional: No distress . Vital signs reviewed. HEENT: EOMI, oral membranes moist Neck: supple Cardiovascular: RRR without murmur. No JVD    Respiratory/Chest: CTA Bilaterally without wheezes or rales. Normal effort    GI/Abdomen: BS +, non-tender, non-distended Ext: no clubbing, cyanosis, or edema Psych: confused   Musculoskeletal:    Cervical back: Normal range of motion and neck supple.    Comments: Responded to tickling- B/L in feet Grabbed our hands on R, not well on L Inconsistent cooperation with exam Skin:    Comments: Some ecchymoses on arms B/L - no backside breakdown  Neurological:    Mental Status: awoke easily and somewhat alert for me    Comments: left central 7, left inattention Moves right side preferentially over left.       Assessment/Plan: 1. Functional deficits which require 3+ hours per day of interdisciplinary therapy in a comprehensive inpatient rehab setting. Physiatrist is providing close team supervision and 24 hour management of active medical problems listed below. Physiatrist and rehab team continue to assess barriers to discharge/monitor patient progress toward functional and medical goals  Care Tool:  Bathing    Body parts bathed by patient: Right arm, Left arm, Chest, Abdomen, Front perineal area, Buttocks, Right upper leg, Left upper leg, Left lower leg, Face  Body parts bathed by helper: Right arm, Left arm, Chest, Right lower leg, Left lower leg, Face, Abdomen, Front perineal area, Buttocks, Right upper leg, Left upper leg     Bathing assist Assist Level: Total Assistance - Patient < 25%     Upper Body Dressing/Undressing Upper body dressing   What is the patient wearing?: Pull over shirt    Upper body assist Assist Level: Maximal Assistance - Patient 25 - 49%    Lower Body Dressing/Undressing Lower body dressing      What is the patient wearing?: Pants, Incontinence brief     Lower body assist Assist for lower body  dressing: Total Assistance - Patient < 25%     Toileting Toileting    Toileting assist Assist for toileting: Dependent - Patient 0%     Transfers Chair/bed transfer  Transfers assist     Chair/bed transfer assist level: 2 Helpers Customer service manager)     Locomotion Ambulation   Ambulation assist   Ambulation activity did not occur: Safety/medical concerns  Assist level: 2 helpers Assistive device: No Device Max distance: 15   Walk 10 feet activity   Assist  Walk 10 feet activity did not occur: Safety/medical concerns  Assist level: 2 helpers Assistive device: No Device   Walk 50 feet activity   Assist Walk 50 feet with 2 turns activity did not occur: Safety/medical concerns         Walk 150 feet activity   Assist Walk 150 feet activity did not occur: Safety/medical concerns         Walk 10 feet on uneven surface  activity   Assist Walk 10 feet on uneven surfaces activity did not occur: Safety/medical concerns         Wheelchair     Assist     Wheelchair activity did not occur: Safety/medical concerns         Wheelchair 50 feet with 2 turns activity    Assist    Wheelchair 50 feet with 2 turns activity did not occur: Safety/medical concerns       Wheelchair 150 feet activity     Assist  Wheelchair 150 feet activity did not occur: Safety/medical concerns       Blood pressure 126/76, pulse 64, temperature 98.6 F (37 C), resp. rate 18, height 5\' 7"  (1.702 m), SpO2 100 %.  Medical Problem List and Plan: 1.  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  Impaired mobility -DVT/anticoagulation:  Continue SQ Lovenox             -antiplatelet  therapy: ASA 325 mg/day 3. Pain Management: tylenol prn 4. Mood: LCSW to follow for evaluation and support.              -antipsychotic agents: N/A 5. Neuropsych: This patient is not capable of making decisions on his own behalf. 6. Skin/Wound Care: Routine pressure relief measures.  7. Fluids/Electrolytes/Nutrition: NPO with tube feeds.             -continue water flushes 175 cc every 4 hours 8. HTN: Monitor BP tid with SBP goal <140/90.             --  continue losartan/day, cardura/day and Metoprolol every 6 hours  -bp controlled 6/15 9. T2DM: Hgb A1C- 6.2. Continue checking CBGs and ISS 10. Lethargy: On Amantadine since 06/09 for activation.continue ritalin also, conservative dosing d/t a fib,cards 11. PAF: Monitor HR tid--question DOAC 10 days post stroke (On 06/13?). --SDH post fall 07/27/20             --continue Tikosyn bid             --Will consult pharmacy to help keep to keep K+>4.0/Mg levels supplemented. 12. Thoracic aortic aneurysm: Follow[facility] by [facility]. 13. Induced hypernatremia: Improving with increase water flushes  6/13- Down to 137- continue to monitor--recheck over next few days 14. Hypokalemia: Resolved, monitor weekly  15. Urinary incontinence-  6/15 continue condom cath per pt's wife- gets agitated if gets wet at all-         LOS: 4 days A FACE TO [place] 08/19/2020, 8:43 AM

## 2020-08-19 NOTE — Progress Notes (Signed)
Speech Language Pathology Daily Session Note  Patient Details  Name: [name] [name] MRN: [id number] Date of Birth: Sep 05, 1940  Today's Date: 08/19/2020 SLP Individual Time: 0815-0900 SLP Individual Time Calculation (min): 45 min  Short Term Goals: Week 1: SLP Short Term Goal 1 (Week 1): Pt will consume therapeutic trials of ice chips and/or small amounts of water with minimal overt s/s of aspiration and max assist for use of swallowing precautions. SLP Short Term Goal 2 (Week 1): Pt will focus his attention to a targeted stimulus in >50% of opportunities with max assist multimodal cues. SLP Short Term Goal 3 (Week 1): Pt will maintain alertness for periods of >5 minutes to participate in basic ADLs with max assist multimodal cues. SLP Short Term Goal 4 (Week 1): Pt will use multimodal means of communication (gestures, head nod/shake, pointing, verbalizing, vocalizing) to convey his immediate needs and wants to caregivers with max assist multimodal cues.  Skilled Therapeutic Interventions: Skilled treatment session focused on dysphagia and cognitive goals. Upon arrival, patient was restless while awake in bed. SLP provided hand over hand for initiation, sustained attention and thoroughness for washing his face as well as oral care via the suction toothbrush. Patient with audible breathing due to suspected secretions in his upper airway that he was unable to clear despite Max A multimodal cues. Patient administered 3 ice chips and initiated a swallow X 1 with extra time and Max A multimodal cues. Other trials resulted in left anterior spillage and residuals were suctioned from oral cavity. Patient with intermittent fluctuations in arousal resulting in Max tactile and verbal cues for focused attention. Patient was essentially unintelligible throughout session but could verbalize his name clearly at end of session. Patient left upright in bed with alarm on and all needs within reach. Continue with current  plan of care.      Pain No/Denies Pain   Therapy/Group: Individual Therapy  Dow Blahnik, [place] 08/19/2020, 10:12 AM

## 2020-08-19 NOTE — Progress Notes (Signed)
Occupational Therapy Session Note  Patient Details  Name: [name] [name] MRN: [id number] Date of Birth: [birth year]  Today's Date: 08/19/2020 OT Individual Time: 1255-1410 OT Individual Time Calculation (min): 75 min    Short Term Goals: Week 1:  OT Short Term Goal 1 (Week 1): Pt will attend to ADL task for 1 minute with mod cueing OT Short Term Goal 2 (Week 1): Pt will don shirt with max A OT Short Term Goal 3 (Week 1): Pt will use BUE to bathe UB with mod A  Skilled Therapeutic Interventions/Progress Updates:    Pt received supine with no indications of pain. Wife present. Pt incontinent of urine, condom cath off once again. Pt rolled R with max A, initiating with increased time and multimodal cueing. Total A for brief change and hygiene. Pt transferred to EOB with max +2 assist. He required max multimodal cueing to initiate stand in the stedy, very restless and poor focused attention to commands. Pt was eventually able to stand with max A+2. He was taken into the bathroom via stedy and transferred to specialty shower chair. He required max A to doff clothing. Pt completed UB bathing with max A (pt 25%) with only mod cueing, washing chest and initiating under arms before losing attention to task and reaching for grab bar instead. Pt completed peri hygiene with mod A. Slow processing time often evident with pt responding to cues 30 sec + later. Pt completed sit > stand following the shower with max A, again requiring multimodal cueing. Pt perseveratively wiping at nose and NG tube but never pulling out. Pt was returned to EOB via stedy. He required max A for sitting balance with posterior support. Pt donned shirt with appropriate motor plan initiation but requiring max A overall. Total A to don shorts and brief. Pt completed a stand pivot to the TIS w/c with max +2- he required heavy max to initiate and then once he was able to conceptualize the motor plan he required only mod A of one. He completed  grooming tasks in the TIS w/c at the sink with mod A overall. Pt was left supine with all needs met, bed alarm set and B hand mitts on.   Extensive discussion with nursing staff re condom cath vs foley cath vs no cath. Nursing to consult director re pt unique situation and high frequency/therapy interruption d/t urinary incontinence.   Therapy Documentation Precautions:  Precautions Precautions: Fall Precaution Comments: cortrak Restrictions Weight Bearing Restrictions: No   Therapy/Group: Individual Therapy  Curtis Sites 08/19/2020, 7:30 AM

## 2020-08-19 NOTE — Progress Notes (Signed)
IV Team at bedside for routine line care as ordered. Lumen #3 (white port) occluded. RN notified. MD does not want to pull PICC at this time, will order TPA per protocol.

## 2020-08-19 NOTE — Progress Notes (Signed)
Physical Therapy Session Note  Patient Details  Name: [name] [name] MRN: [id number] Date of Birth: 09/20/1940  Today's Date: 08/19/2020 PT Individual Time: 1005-1105 PT Individual Time Calculation (min): 60 min   Short Term Goals: Week 1:  PT Short Term Goal 1 (Week 1): Pt will perform supine<>sit with mod-max A of 1 person consistently. PT Short Term Goal 2 (Week 1): Pt will perform sit<>stands using LRAD with mod A +2 PT Short Term Goal 3 (Week 1): Pt will perform bed<>chair transfers using LRAD with max A of 1 person PT Short Term Goal 4 (Week 1): Patient will initiation gait training PT Short Term Goal 5 (Week 1): Patient will perform sitting balance with mod A consistently  Skilled Therapeutic Interventions/Progress Updates:     Patient in bed with his wife at bedside upon PT arrival. Patient with eyes closed intermittent at beginning of session with 1x1-2 min bout of blankly staring ahead without responding to therapist's cues or looking at therapist. Vitals : BP 136/68, HR 78, SPO2 100%.   Once in sitting patient more alert and restless and able to participate in automatic functional activities throughout session. Patient did not verbalize throughout session today despite many attempts and cues from therapits. Patient did display discomfort/pain with neck mobility with grimacing during session, provided massage, repositioning, and PROM stretch as interventions during session.   Patient perseverated on wiping and blowing his nose during session, therapist protected feeding tube and cleaned patient's nose x2 using tissues during session.   Therapeutic Activity: Bed Mobility: Donned shorts and threaded catheter bag through shorts with total A bed level. Performed rolling R/L with max A with patient initiating reaching for the rail with his R hand on both sides. Patient performed supine to/from sit with max A +2. Provided verbal cues for rolling R then using his R arm to push up and lower  his trunk. Transfers: Patient performed sit to/from stand x4 with mod progressing to min A +2 using RW. Provided verbal cues and hand-over-hand assist for B hand placement L>R and initiation, provided facilitation for forward weight shift and hip extension to come to standing L>R.  Gait Training:  Patient ambulated 40 feet and 60 feet using RW with Min-mod A +2 with a his wife providing w/c follow for safety. Ambulated with shuffling gait with decreased foot clearance bilaterally and decreased step length on R with decreased L weight shift, increased B knee/hip flexion L>R, R abducted gait with fatigue, and intermittent downward head gaze. Provided verbal cues for visual target for looking ahead and increased distance with good response from patient, staying close to the RW and increased step height and step length with facilitation for L weight shift for patient to increased R step length. He also ambulated 8 feet as above with max A +2 to complete 180 deg turn to sit on the bed due to motor planning deficits.   Neuromuscular Re-ed: Patient performed the following sitting balance activities: -sitting EOB focused on midline orientation due to posterior lean with visual targets and use of forward reaching x5 min progressing from max A to min A with intermittent supervision -standing balance x2 min focused on L knee/hip extension standing in the RW, initially with L lean in standing progressing to midline from max A +2 to min A +2  Patient in bed with his wife at bedside with urinary incontinence due to seal coming off of catheter at end of session, RN made aware, with breaks locked, bed alarm set,  and all needs within reach.   Therapy Documentation Precautions:  Precautions Precautions: Fall Precaution Comments: cortrak Restrictions Weight Bearing Restrictions: No    Therapy/Group: Individual Therapy  Jyden Kromer L Arnaldo Heffron PT, DPT  08/19/2020, 7:03 PM

## 2020-08-19 NOTE — Plan of Care (Signed)
  Problem: Consults Goal: RH STROKE PATIENT EDUCATION Description: See Patient Education module for education specifics  Outcome: Not Progressing Goal: Nutrition Consult-if indicated Outcome: Not Progressing Goal: Diabetes Guidelines if Diabetic/Glucose > 140 Description: If diabetic or lab glucose is > 140 mg/dl - Initiate Diabetes/Hyperglycemia Guidelines & Document Interventions  Outcome: Not Progressing   Problem: RH BOWEL ELIMINATION Goal: RH STG MANAGE BOWEL WITH ASSISTANCE Description: STG Manage Bowel with Min Assistance. Outcome: Not Progressing Goal: RH STG MANAGE BOWEL W/MEDICATION W/ASSISTANCE Description: STG Manage Bowel with Medication with [place]. Outcome: Not Progressing   Problem: RH BLADDER ELIMINATION Goal: RH STG MANAGE BLADDER WITH ASSISTANCE Description: STG Manage Bladder With min Assistance Outcome: Not Progressing Goal: RH STG MANAGE BLADDER WITH MEDICATION WITH ASSISTANCE Description: STG Manage Bladder With Medication With min Assistance. Outcome: Not Progressing   Problem: RH SKIN INTEGRITY Goal: RH STG ABLE TO PERFORM INCISION/WOUND CARE W/ASSISTANCE Description: STG Able To Perform Incision/Wound Care With min Assistance. Outcome: Not Progressing   Problem: RH SAFETY Goal: RH STG ADHERE TO SAFETY PRECAUTIONS W/ASSISTANCE/DEVICE Description: STG Adhere to Safety Precautions With min Assistance/Device. Outcome: Not Progressing Goal: RH STG DECREASED RISK OF FALL WITH ASSISTANCE Description: STG Decreased Risk of Fall With min Assistance. Outcome: Not Progressing   Problem: RH COGNITION-NURSING Goal: RH STG USES MEMORY AIDS/STRATEGIES W/ASSIST TO PROBLEM SOLVE Description: STG Uses Memory Aids/Strategies With cues and reminders to Problem Solve. Outcome: Not Progressing Goal: RH STG ANTICIPATES NEEDS/CALLS FOR ASSIST W/ASSIST/CUES Description: STG Anticipates Needs/Calls for Assist With Cues and reminders. Outcome: Not Progressing

## 2020-08-19 NOTE — Progress Notes (Signed)
Physical Therapy Session Note  Patient Details  Name: [name] [name] MRN: [id number] Date of Birth: 04/28/1940  Today's Date: 08/19/2020 PT Individual Time: 1530-1600 PT Individual Time Calculation (min): 30 min   Short Term Goals: Week 1:  PT Short Term Goal 1 (Week 1): Pt will perform supine<>sit with mod-max A of 1 person consistently. PT Short Term Goal 2 (Week 1): Pt will perform sit<>stands using LRAD with mod A +2 PT Short Term Goal 3 (Week 1): Pt will perform bed<>chair transfers using LRAD with max A of 1 person PT Short Term Goal 4 (Week 1): Patient will initiation gait training PT Short Term Goal 5 (Week 1): Patient will perform sitting balance with mod A consistently Week 2:    Week 3:    Week 4:     Skilled Therapeutic Interventions/Progress Updates:  Pt initially receiving nursing care, therapist returned later in day and pt able to participate in session.   Pain:  Pt did not apprear to be in any pain during session. Not able to give meaningful response to inquiry at time of treatment.  Treatment to tolerance.  Rest breaks and repositioning as needed.  Pt initially supine, wife at bedside and eager for session.  Pt rolls R w/max assist, side to sit w/max assist.  Initially leaning heavily posteriorly but gradually able to flex pt at hips and pt adjusts to upright/midline, sits w/min to cga.   Pt appears to possibly be hallucinating during session.  Reaches into air to grasp hallucinated objects w/L hand, transfers to R hand, then transfers into air to his R side.  This occurs frequently during 30 min session.  Pt awake and alert during session.   Pt Sit to stand in Steady w/bed elevated, heavy mod assist, and quickly transitions to perched sitting in Steady.  Pt intermittently scoots to L w/hips in Steady, reaches toward bed w/RUE as if attempting to lie down.  At times grabs bedding and pulls off bed, tends to lean to L/therapist positions to L and blocks pt for safety in  Steady. Pt Sit to stand mult times thru session w/moderate cueing to initiate, assist to place L hand, attempted counting aloud but pt unable to verbalize along w/therapist, sits after several seconds. Perched on Steady, pt engaged in brushing hair w/R hand only and brushes only R side of head, mult efforts, washing face w/warm washcloth - pt perseverates on nose/attempts to blow and pick nose w/washcloth/therapist protects tube. Dual hand task of holding tissue box w/hand over hand L hand to hold box while pulling out tissues w/R hand.   Pt sit to stand to sit on bed from [facility] w/mod assist.  Sit to supine w/max assist and manual facilitation for sequencing.  Mits applied by therapist.  Pt left supine w/rails up x 4, alarm set, bed in lowest position, wife at bedside and needs in reach.   Therapy Documentation Precautions:  Precautions Precautions: Fall Precaution Comments: cortrak Restrictions Weight Bearing Restrictions: No     Therapy/Group: Individual Therapy Callie Fielding, [place] 08/19/2020, 4:16 PM

## 2020-08-20 ENCOUNTER — Inpatient Hospital Stay (HOSPITAL_COMMUNITY): Payer: Medicare Other

## 2020-08-20 LAB — BASIC METABOLIC PANEL
Anion gap: 6 (ref 5–15)
BUN: 16 mg/dL (ref 8–23)
CO2: 27 mmol/L (ref 22–32)
Calcium: 8.4 mg/dL — ABNORMAL LOW (ref 8.9–10.3)
Chloride: 105 mmol/L (ref 98–111)
Creatinine, Ser: 0.75 mg/dL (ref 0.61–1.24)
GFR, Estimated: >60 mL/min (ref >60)
Glucose, Bld: 164 mg/dL — ABNORMAL HIGH (ref 70–99)
Potassium: 3.6 mmol/L (ref 3.5–5.1)
Sodium: 138 mmol/L (ref 135–145)

## 2020-08-20 LAB — GLUCOSE, CAPILLARY
Glucose-Capillary: 151 mg/dL — ABNORMAL HIGH (ref 70–99)
Glucose-Capillary: 134 mg/dL — ABNORMAL HIGH (ref 70–99)
Glucose-Capillary: 99 mg/dL (ref 70–99)
Glucose-Capillary: 113 mg/dL — ABNORMAL HIGH (ref 70–99)
Glucose-Capillary: 107 mg/dL — ABNORMAL HIGH (ref 70–99)

## 2020-08-20 LAB — MAGNESIUM: Magnesium: 2 mg/dL (ref 1.7–2.4)

## 2020-08-20 MED ORDER — MAGNESIUM GLUCONATE 500 MG PO TABS
500.0000 mg | ORAL_TABLET | Freq: Every day | ORAL | Status: DC
  Administered 2020-08-20 – 2020-09-15 (×27): 500 mg
  Filled 2020-08-20 (×27): qty 1

## 2020-08-20 MED ORDER — POTASSIUM CHLORIDE 20 MEQ PO PACK
40.0000 meq | PACK | Freq: Once | ORAL | Status: AC
  Administered 2020-08-20: 40 meq
  Filled 2020-08-20: qty 2

## 2020-08-20 MED ORDER — POTASSIUM CHLORIDE 20 MEQ PO PACK
40.0000 meq | PACK | Freq: Every day | ORAL | Status: DC
  Administered 2020-08-21 – 2020-08-24 (×4): 40 meq
  Filled 2020-08-20 (×4): qty 2

## 2020-08-20 MED ORDER — MAGNESIUM GLUCONATE 500 MG PO TABS: 500.0000 mg | ORAL_TABLET | Freq: Every day | ORAL | Status: DC

## 2020-08-20 MED ORDER — JEVITY 1.5 CAL/FIBER PO LIQD
1000.0000 mL | ORAL | Status: DC
  Administered 2020-08-20 – 2020-08-22 (×4): 1000 mL
  Filled 2020-08-20 (×5): qty 1000

## 2020-08-20 NOTE — Progress Notes (Signed)
Pharmacy: Dofetilide (Tikosyn) - Follow Up Assessment and Electrolyte Replacement  Pharmacy consulted to assist in monitoring and replacing electrolytes in this 80 y.o. male admitted on 08/15/2020 continuing dofetilide. Pharmacy consulted to keep potassium above 4 and magnesium above 2.   Labs:    Component Value Date/Time   K 3.6 08/20/2020 0405   K 4.1 11/26/2013 0000   MG 2.0 08/20/2020 0405     Plan: Potassium: K 3.6 while on 20 mEq daily. Noted patient received an average of 50 mEq daily 6/10-6/14. Will give an extra 40 mEq today and increase scheduled Kcl to 40 mEq daily   Magnesium:  Mg down to 2.0, start schedule magnesium gluconate  F/u K and Mg on 6/20  Thank you for allowing pharmacy to participate in this patient's care.  Benetta Spar, PharmD, BCPS, BCCP Clinical Pharmacist  Please check AMION for all [place] phone numbers After 10:00 PM, call [place] [phone]

## 2020-08-20 NOTE — Progress Notes (Signed)
Speech Language Pathology Daily Session Note  Patient Details  Name: [name] [name] MRN: [id number] Date of Birth: 04/12/1940  Today's Date: 08/20/2020  Session 1: SLP Individual Time: 0900-0930 SLP Individual Time Calculation (min): 30 min  Session 2: SLP Individual Time: 1145-1200 SLP Individual Calculation (min): 15 min  Short Term Goals: Week 1: SLP Short Term Goal 1 (Week 1): Pt will consume therapeutic trials of ice chips and/or small amounts of water with minimal overt s/s of aspiration and max assist for use of swallowing precautions. SLP Short Term Goal 2 (Week 1): Pt will focus his attention to a targeted stimulus in >50% of opportunities with max assist multimodal cues. SLP Short Term Goal 3 (Week 1): Pt will maintain alertness for periods of >5 minutes to participate in basic ADLs with max assist multimodal cues. SLP Short Term Goal 4 (Week 1): Pt will use multimodal means of communication (gestures, head nod/shake, pointing, verbalizing, vocalizing) to convey his immediate needs and wants to caregivers with max assist multimodal cues.  Skilled Therapeutic Interventions:  Session 1: Skilled treatment session focused on dysphagia and cognitive goals. Patient received from PT and appeared lethargic requiring Max verbal and tactile cues throughout session for arousal. The maximum amount of time the patient kept his eyes open was ~5 minutes. Patient attempting to utilize gestures to express wants/needs, however, with Max encouragement, patient could verbalize needs at the word level. Intelligibility reduced due to low vocal intensity. Patient consumed minimal trials of ice chips with hand over hand assist needed for self-feeding. Patient with moderate anterior spillage but was initiated a swallow with all of trials resulting in a subtle throat clear. Due to patient's minimal arousal, session was ended early and SLP discussed with the patient's wife the possibility of 2 shorter sessions to  maximize treatment time. She verbalized understanding and agreement. Patient left upright in bed with alarm on, wife present and all needs within reach. Continue with current plan of care.   Session 2: Skilled co-treatment with OT was utilized to maximize arousal to make-up time missed minutes from previous SLP session due to fatigue and lethargy. Patient sat EOB with OT and eyes stayed open throughout the entirety of the session. Patient was extremely restless with multiple attempts to stand and attempting to grab anything within his reach. With Max A multimodal cues, patient verbalized X3 at the word level by identifying colors of flowers and reporting who brought them to him. Max verbal cues were also needed for patient to scan to midline while attempting to wipe water from the table with a washcloth. Patient with anterior spillage of saliva resulting in requiting the oral suction multiple times. Patient returned to supine and patient's son present towards end of session and educated on current goals and progress. Patient left supine in bed with mitts and alarm on. Continue with current plan of care.   Pain No/Denies Pain   Therapy/Group: Individual Therapy  Ulisses Vondrak 08/20/2020, 10:14 AM

## 2020-08-20 NOTE — Progress Notes (Signed)
Physical Therapy Session Note  Patient Details  Name: [name] [name] MRN: [id number] Date of Birth: [birth year]  Today's Date: 08/20/2020 PT Individual Time: 0800-0900 PT Individual Time Calculation (min): 60 min   Short Term Goals: Week 1:  PT Short Term Goal 1 (Week 1): Pt will perform supine<>sit with mod-max A of 1 person consistently. PT Short Term Goal 2 (Week 1): Pt will perform sit<>stands using LRAD with mod A +2 PT Short Term Goal 3 (Week 1): Pt will perform bed<>chair transfers using LRAD with max A of 1 person PT Short Term Goal 4 (Week 1): Patient will initiation gait training PT Short Term Goal 5 (Week 1): Patient will perform sitting balance with mod A consistently  Skilled Therapeutic Interventions/Progress Updates:     Patient in bed alert and waving to therapist to verbal stimulus, demonstrating improved arousal and focused attention this morning. Patient indicated neck pain only with mobility of his neck with grimacing during session, RN made aware. PT provided repositioning, rest breaks, and distraction as pain interventions throughout session.   Patient with dried stool around buttocks and catheter cover from previous BM on inspection for incontinence. Performed peri-care and cleaned catheter with total A during session and RN made aware. Noted blanchable redness on R buttock and blister at R gluteal cleft uncovered. Placed foam sacral dressing and foam 2"x2" dressing over blister.   Therapeutic Activity: Bed Mobility: Patient performed rolling R/L with mod-max A +2 and supine to/from sit with max A of 1 person and SBA from a second person for safety. Provided verbal cues and hand-over-hand assist for reaching to the opposite rail and bend lower extremities to hook-lying. Transfers: Patient performed sit to/from stand x2 with min A +2 using RW and stand pivot with max A of 1 person and a second person steadying the RW. Provided verbal and tactile cue for initiation and  tactile cues for hip extension. Provided hand over hand assist for hand placement on RW required mod cues for reaching and max cues and facilitation for opening L hand to grasp.  Gait Training:  Patient ambulated 15 feet and 106 feet using RW with mod A +2 using RW. Ambulated with R pushing causing moderate L lean with R foot abducted, increased L knee/hip flexion, R posterior pelvic rotation, and shuffling gait pattern. Provided verbal cues for locating visual target (Exit sign) to promote erect posture, patient able to sustain attention 5-15 sec x4 on visual target during gait training before looking away.  Neuromuscular Re-ed: Patient performed the following activities seated in the TIS w/c at the sink for improved motor planning and sequencing with a functional task: -Focused on intellectual awareness with multimodal cues to incorporate L hand during hand washing at the sink. Patient turned on the cold water and wet his R hand automatically and took soap from therapist's hand with his R hand, required 3 cues and hand-over-hand assist after initiation to bring his L hand to his R to rub them together, patient began bringing his hands to his face and required max tactile and verbal cues to redirect to the original task. He was able to turn off the water, then perseverated on turning the water back on until therapist provided him with paper towels. Again, required 3 cues to incorporate L hand in drying his hands. -Attempted to have patient wash his face at the sink, however, patient unable to focus his attention on the task using a washcloth, but did splash water on his face x2  with his R hand  Therapeutic Exercise:  -cervical rotation PROM with gentle over pressure and upper trap stretch with facilitation at the opposite acromion 2x1 min R/L with improved ROM from ~20 deg to ~45 deg on each side  Patient in bed with his wife at bedside at end of session with breaks locked, bed alarm set, and all needs  within reach.   Therapy Documentation Precautions:  Precautions Precautions: Fall Precaution Comments: cortrak Restrictions Weight Bearing Restrictions: No    Therapy/Group: Individual Therapy  Vinita Prentiss L Davyon Fisch PT, DPT  08/20/2020, 4:30 PM

## 2020-08-20 NOTE — Progress Notes (Addendum)
Nutrition Follow-up  DOCUMENTATION CODES:   Not applicable  INTERVENTION:  Once able to advance past 35 ml/hr:  Increase Jevity 1.5 cal formula via Cortrak NGT to goal rate of 65 ml/hr x 20 hours (may hold TF for up to 4 hours for therapy)  Provide 45 ml Prosource TF TID per tube.   Free water flush of 100 ml q 4 hours per tube. (MD to adjust as appropriate)  Tube feeding regimen provides 2070 kcal, 116 grams of protein, 1588 ml free water.   NUTRITION DIAGNOSIS:   Inadequate oral intake related to inability to eat as evidenced by NPO status; ongoing  GOAL:   Patient will meet greater than or equal to 90% of their needs; met with TF  MONITOR:   TF tolerance, Diet advancement, Labs, Weight trends, I & O's  REASON FOR ASSESSMENT:   New TF    ASSESSMENT:   80 yo male with a PMH of diastolic CHF, AVR/MVR, A-fib on coumadin/Tiksoyn who was admitted on 5/23 for difficulty speaking and R sided weakness after a fall the day before. 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 He was intubated for airway protection and family elected on DNR with comfort care due to poor prognosis.  However, patient started showing ability to follow commands on 5/24 with repeat CT showing persistent bilateral SDH.  He was taken to the OR for right craniotomy for evacuation of SDH. He tolerated extubation without difficulty but was limited by left greater than right-sided weakness with visual deficits, strong posterior bias as well as poor safety awareness with cognitive deficits.  He was admitted to rehab on 5/27 for intensive rehab program to consist of PT OT and speech therapy. 6/6 - cortrak placed - tip gastric   Pt continues on NPO status on tube feeds via Cortrak NGT.  RD to modify tube feeding orders to infuse over 20 hours to allow for feeds to be held for up to 4 hours a day for therapy. Noted pt with slight abdominal distention today. MD has decrease goal rate of  35 ml/hr. Once able to advance tube feeding rate, recommend increasing to goal rate of 65 ml/hr. Labs and medications reviewed.   Diet Order:   Diet Order             Diet NPO time specified  Diet effective now                   EDUCATION NEEDS:   Education needs have been addressed  Skin:  Skin Assessment: Reviewed RN Assessment  Last BM:  6/15  Height:   Ht Readings from Last 1 Encounters:  08/17/20 _0  (1.702 m)    Weight:   Wt Readings from Last 1 Encounters:  08/20/20 86 kg    Ideal Body Weight:  67.3 kg  BMI:  Body mass index is 29.69 kg/m.  Estimated Nutritional Needs:   Kcal:  2000-2200  Protein:  115-230 grams  Fluid:  >2 L  Corrin Parker, MS, RD, LDN RD pager number/after hours weekend pager number on Amion.

## 2020-08-20 NOTE — Progress Notes (Signed)
On initial assessment pt's coretrack is marked at 58 at the nare. Previous charting shows coretrack placed at 68. Stopped Tfing. VS taken and noted in chart. Pt's RR is even and unlabored. Oxygen saturation is 100 on RA. Abdomen is soft with active BS. Call made to provider on call Reesa Chew PA. Order's rec'd.

## 2020-08-20 NOTE — Progress Notes (Signed)
Occupational Therapy Session Note  Patient Details  Name: EMET RAFANAN MRN: [id number] Date of Birth: 1940-12-27  Today's Date: 08/20/2020 OT Individual Time: 1145-1200 OT Individual Time Calculation (min): 15 min   Short Term Goals: Week 1:  OT Short Term Goal 1 (Week 1): Pt will attend to ADL task for 1 minute with mod cueing OT Short Term Goal 2 (Week 1): Pt will don shirt with max A OT Short Term Goal 3 (Week 1): Pt will use BUE to bathe UB with mod A  Skilled Therapeutic Interventions/Progress Updates:    Pt greeted semi-reclined in bed asleep. Co-treat with SLP. Pt needed total A to come to sitting EOB to achieve alertness. Once seated EOB, pt grabbing for every object in site and pulling at cortrak. OT provided max/total A for sitting balance, while SLP assisted with oral care, naming of objects, and trying to get him to use his voice. Pt EXTREMELY distracted and needed constant cues to try to attend to functional task. Used bright colored flowers in room with pt able to attend to those for a few seconds. Pt was alert while sitting EOB. Total A to return to supine. Pt left semi-reclined in bed with bed alarm on, call bell in reach, and needs met.   Therapy Documentation Precautions:  Precautions Precautions: Fall Precaution Comments: cortrak Restrictions Weight Bearing Restrictions: No Pain:  Denies pain   Therapy/Group: Individual Therapy  Valma Cava 08/20/2020, 12:58 PM

## 2020-08-20 NOTE — Progress Notes (Signed)
Occupational Therapy TBI Note  Patient Details  Name: [name] [name] MRN: [id number] Date of Birth: 02-19-1941  Today's Date: 08/20/2020 OT Individual Time: 1300-1400 OT Individual Time Calculation (min): 60 min    Short Term Goals: Week 1:  OT Short Term Goal 1 (Week 1): Pt will attend to ADL task for 1 minute with mod cueing OT Short Term Goal 2 (Week 1): Pt will don shirt with max A OT Short Term Goal 3 (Week 1): Pt will use BUE to bathe UB with mod A  Skilled Therapeutic Interventions/Progress Updates:     Pt received in bed with no indication of pain. Pt very internally and externally distracted and unable to attend to task >3 seconds at a time before pulling on NG tube, attempting to blow nose on various surfaces and/or grabbing at pillows. Pt requires total A for bed mobility and MAX-total A stand pivot transfers with +2 for guiding A during pivot. When Pt able to initiate more of a MAX, however if not attending or loses attention during transfer transitions to total A. Pt sits EOM with total-S level sitting balance however default position is posterior L lean. Pt requires max VC for locating cone on R for  far reach to R to shift to midline orientation but then loses midline whengiving to OT on L. Pt rests leaning on ball to attempt to facilitate anterior pelvic tilt and chest wall expansion. Exited session with pt seated in bed, exit alarm on and call light in reach     Therapy/Group: Individual Therapy  Tonny Branch 08/20/2020, 6:53 AM

## 2020-08-20 NOTE — Progress Notes (Signed)
PROGRESS NOTE   Subjective/Complaints: Pt more alert today. Soiled bed, therapy cleaning him up. Wife at bedside. Wife notes that belly is sl distended.  ROS: Limited due to cognitive/behavioral     Objective:   No results found. No results for input(s): WBC, HGB, HCT, PLT in the last 72 hours.  Recent Labs    08/18/20 0428 08/20/20 0405  NA 139 138  K 4.0 3.6  CL 106 105  CO2 26 27  GLUCOSE 141* 164*  BUN 19 16  CREATININE 0.73 0.75  CALCIUM 8.4* 8.4*    Intake/Output Summary (Last 24 hours) at 08/20/2020 1005 Last data filed at 08/20/2020 0533 Gross per 24 hour  Intake 550 ml  Output 1500 ml  Net -950 ml        Physical Exam: Vital Signs Blood pressure 122/73, pulse 66, temperature 98 F (36.7 C), resp. rate 18, height 5\' 7"  (1.702 m), weight 86 kg, SpO2 96 %. Constitutional: No distress . Vital signs reviewed. HEENT: EOMI, oral membranes moist, NGT Neck: supple Cardiovascular: RRR without murmur. No JVD    Respiratory/Chest: CTA Bilaterally without wheezes or rales. Normal effort    GI/Abdomen: BS +, non-tender,  distended but not overly tense, gaseous sounds Ext: no clubbing, cyanosis, or edema Psych: confused Musculoskeletal:    Cervical back: Normal range of motion and neck supple.     Skin:    Comments: Some ecchymoses on arms B/L - no backside breakdown,scalp incision closed Neurological: alert, left central 7.      Comments:moves left side with tactile and verbal cues. Moves right side spontaneously, mosly non-verbal with intermittent grunts or short phrases. Makes eye contact when cued.       Assessment/Plan: 1. Functional deficits which require 3+ hours per day of interdisciplinary therapy in a comprehensive inpatient rehab setting. Physiatrist is providing close team supervision and 24 hour management of active medical problems listed below. Physiatrist and rehab team continue to assess  barriers to discharge/monitor patient progress toward functional and medical goals  Care Tool:  Bathing    Body parts bathed by patient: Chest, Abdomen, Front perineal area, Face   Body parts bathed by helper: Right arm, Left arm, Right lower leg, Left lower leg, Buttocks, Right upper leg, Left upper leg     Bathing assist Assist Level: Maximal Assistance - Patient 24 - 49%     Upper Body Dressing/Undressing Upper body dressing   What is the patient wearing?: Pull over shirt    Upper body assist Assist Level: Maximal Assistance - Patient 25 - 49%    Lower Body Dressing/Undressing Lower body dressing      What is the patient wearing?: Pants, Incontinence brief     Lower body assist Assist for lower body dressing: Total Assistance - Patient < 25%     Toileting Toileting    Toileting assist Assist for toileting: Dependent - Patient 0%     Transfers Chair/bed transfer  Transfers assist     Chair/bed transfer assist level: 2 Helpers (Stedy)     Locomotion Ambulation   Ambulation assist   Ambulation activity did not occur: Safety/medical concerns  Assist level: 2 helpers Assistive device:  No Device Max distance: 15   Walk 10 feet activity   Assist  Walk 10 feet activity did not occur: Safety/medical concerns  Assist level: 2 helpers Assistive device: No Device   Walk 50 feet activity   Assist Walk 50 feet with 2 turns activity did not occur: Safety/medical concerns         Walk 150 feet activity   Assist Walk 150 feet activity did not occur: Safety/medical concerns         Walk 10 feet on uneven surface  activity   Assist Walk 10 feet on uneven surfaces activity did not occur: Safety/medical concerns         Wheelchair     Assist     Wheelchair activity did not occur: Safety/medical concerns         Wheelchair 50 feet with 2 turns activity    Assist    Wheelchair 50 feet with 2 turns activity did not occur:  Safety/medical concerns       Wheelchair 150 feet activity     Assist  Wheelchair 150 feet activity did not occur: Safety/medical concerns       Blood pressure 122/73, pulse 66, temperature 98 F (36.7 C), resp. rate 18, height 5\' 7"  (1.702 m), weight 86 kg, SpO2 96 %.  Medical Problem List and Plan: 1.  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  Impaired mobility -DVT/anticoagulation:  Continue SQ Lovenox             -antiplatelet therapy: ASA 325 mg/day 3. Pain Management: tylenol prn 4. Mood: LCSW to follow for evaluation and support.              -antipsychotic agents: N/A 5. Neuropsych: This patient is not capable of making decisions on his own behalf. 6. Skin/Wound Care: Routine pressure relief measures.  7. Fluids/Electrolytes/Nutrition: NPO with tube feeds.             -reduced water flushes to 100 cc every 4 hours  8. HTN: Monitor BP tid with SBP goal <140/90.             --continue losartan/day, cardura/day and Metoprolol every 6 hours  -bp controlled 6/16 9. T2DM: Hgb A1C- 6.2. well controlled 10. Lethargy: improved today.  continue Amantadine since 06/09 for activation.continue ritalin also, conservative dosing d/t a fib,cards 11. PAF: Monitor HR tid--question DOAC 10 days post stroke (On 06/13?). --SDH post fall 07/27/20             --continue Tikosyn bid             --appreciate pharmacy help to keep K+>4.0/Mg levels  . 12. Thoracic aortic aneurysm: Followed by [facility]. 13. Induced hypernatremia: Improving with increase water flushes  6/13- Down to 137- continue to monitor--recheck over next few days 14. Hypokalemia: see  #11  15. Urinary incontinence-  6/16 continue condom cath per pt's wife- gets agitated if gets wet at all-  16. Abdominal distention:  -check KUB  -moving bowels, actually have been loose  LOS: 5 days A FACE TO FACE EVALUATION WAS PERFORMED  Meredith Staggers 08/20/2020, 10:05 AM

## 2020-08-20 NOTE — Progress Notes (Signed)
Called provider on call, Reesa Chew PA, about KUB results. Order's rec'd. Will hold Tfing and water flushes through the night, but give medications.

## 2020-08-21 ENCOUNTER — Inpatient Hospital Stay (HOSPITAL_COMMUNITY): Payer: Medicare Other

## 2020-08-21 LAB — GLUCOSE, CAPILLARY
Glucose-Capillary: 104 mg/dL — ABNORMAL HIGH (ref 70–99)
Glucose-Capillary: 107 mg/dL — ABNORMAL HIGH (ref 70–99)
Glucose-Capillary: 107 mg/dL — ABNORMAL HIGH (ref 70–99)
Glucose-Capillary: 111 mg/dL — ABNORMAL HIGH (ref 70–99)
Glucose-Capillary: 124 mg/dL — ABNORMAL HIGH (ref 70–99)
Glucose-Capillary: 149 mg/dL — ABNORMAL HIGH (ref 70–99)

## 2020-08-21 MED ORDER — ACETAMINOPHEN 325 MG PO TABS: 325.0000 mg | ORAL_TABLET | ORAL | Status: DC | PRN

## 2020-08-21 MED ORDER — CHLORHEXIDINE GLUCONATE CLOTH 2 % EX PADS
6.0000 | MEDICATED_PAD | Freq: Two times a day (BID) | CUTANEOUS | Status: DC
  Administered 2020-08-22 – 2020-09-17 (×50): 6 via TOPICAL

## 2020-08-21 NOTE — Progress Notes (Signed)
PROGRESS NOTE   Subjective/Complaints: Pt getting up with PT this morning. Wife feels that he's a little restless. Asked if it could be ritalin causing. NGT was pulled out partially.   ROS: Limited due to cognitive/behavioral     Objective:   DG Abd 1 View  Result Date: 08/20/2020 CLINICAL DATA:  Abdominal distension EXAM: ABDOMEN - 1 VIEW COMPARISON:  08/10/2020 FINDINGS: Soft feeding tube tip in the region of the antrum or proximal duodenum. No evidence of bowel obstruction. Possible mild ileus pattern. No acute bone finding. IMPRESSION: Soft feeding tube in place. Question mild ileus pattern. Findings not suggestive of obstruction. Electronically Signed   By: Nelson Chimes M.D.   On: 08/20/2020 11:35   DG Abd Portable 1V  Result Date: 08/21/2020 CLINICAL DATA:  NG placement EXAM: PORTABLE ABDOMEN - 1 VIEW COMPARISON:  08/20/2020 FINDINGS: Feeding tube is been advanced with the tip now in the region of the gastric antrum. Normal bowel gas pattern. IMPRESSION: Feeding tube tip advanced to the gastric antral region. Electronically Signed   By: Franchot Gallo M.D.   On: 08/21/2020 10:07   DG Abd Portable 1V  Result Date: 08/20/2020 CLINICAL DATA:  OG tube placement EXAM: PORTABLE ABDOMEN - 1 VIEW COMPARISON:  08/20/2020 FINDINGS: Enteric tube tip is in the left upper quadrant consistent with location in the upper stomach. Scattered gas in normal caliber small and large bowel. IMPRESSION: Enteric tube tip is in the left upper quadrant consistent with location in the upper stomach. Electronically Signed   By: Lucienne Capers M.D.   On: 08/20/2020 20:45   No results for input(s): WBC, HGB, HCT, PLT in the last 72 hours.  Recent Labs    08/20/20 0405  NA 138  K 3.6  CL 105  CO2 27  GLUCOSE 164*  BUN 16  CREATININE 0.75  CALCIUM 8.4*    Intake/Output Summary (Last 24 hours) at 08/21/2020 1024 Last data filed at 08/21/2020  1005 Gross per 24 hour  Intake --  Output 2425 ml  Net -2425 ml        Physical Exam: Vital Signs Blood pressure 132/84, pulse 67, temperature 97.6 F (36.4 C), resp. rate 18, height 5\' 7"  (1.702 m), weight 86 kg, SpO2 97 %. Constitutional: No distress . Vital signs reviewed. HEENT: EOMI, oral membranes moist, NGT appears to have been pulled partially Neck: supple Cardiovascular: RRR without murmur. No JVD    Respiratory/Chest: CTA Bilaterally without wheezes or rales. Normal effort    GI/Abdomen: BS +, non-tender, non-distended Ext: no clubbing, cyanosis, or edema Psych: flat, slow to engage today Musculoskeletal:    Cervical back: no axial or limb pain     Skin:    Comments: Some ecchymoses on arms B/L - no backside breakdown,scalp incision closed Neurological: alert, left central 7.      Comments:activated left arm/leg with tactile/verbal cues from PT. Was able to bear weight LLE. But appears a little lethargic this morning. Right gaze preference.. Moves right side spontaneously, remains mostly non-verbal with intermittent grunts or short phrases.         Assessment/Plan: 1. Functional deficits which require 3+ hours per day of interdisciplinary  therapy in a comprehensive inpatient rehab setting. Physiatrist is providing close team supervision and 24 hour management of active medical problems listed below. Physiatrist and rehab team continue to assess barriers to discharge/monitor patient progress toward functional and medical goals  Care Tool:  Bathing    Body parts bathed by patient: Chest, Abdomen, Front perineal area, Face   Body parts bathed by helper: Right arm, Left arm, Right lower leg, Left lower leg, Buttocks, Right upper leg, Left upper leg     Bathing assist Assist Level: Maximal Assistance - Patient 24 - 49%     Upper Body Dressing/Undressing Upper body dressing   What is the patient wearing?: Pull over shirt    Upper body assist Assist Level:  Maximal Assistance - Patient 25 - 49%    Lower Body Dressing/Undressing Lower body dressing      What is the patient wearing?: Pants, Incontinence brief     Lower body assist Assist for lower body dressing: Total Assistance - Patient < 25%     Toileting Toileting    Toileting assist Assist for toileting: Dependent - Patient 0%     Transfers Chair/bed transfer  Transfers assist     Chair/bed transfer assist level: 2 Helpers (HHA and Stedy for return to bed, fatigue.)     Locomotion Ambulation   Ambulation assist   Ambulation activity did not occur: Safety/medical concerns  Assist level: 2 helpers Assistive device: Walker-rolling Max distance: 3   Walk 10 feet activity   Assist  Walk 10 feet activity did not occur: Safety/medical concerns  Assist level: 2 helpers Assistive device: No Device   Walk 50 feet activity   Assist Walk 50 feet with 2 turns activity did not occur: Safety/medical concerns         Walk 150 feet activity   Assist Walk 150 feet activity did not occur: Safety/medical concerns         Walk 10 feet on uneven surface  activity   Assist Walk 10 feet on uneven surfaces activity did not occur: Safety/medical Armed forces technical officer activity did not occur: Safety/medical concerns         Wheelchair 50 feet with 2 turns activity    Assist    Wheelchair 50 feet with 2 turns activity did not occur: Safety/medical concerns       Wheelchair 150 feet activity     Assist  Wheelchair 150 feet activity did not occur: Safety/medical concerns       Blood pressure 132/84, pulse 67, temperature 97.6 F (36.4 C), resp. rate 18, height 5\' 7"  (1.702 m), weight 86 kg, SpO2 97 %.  Medical Problem List and Plan: 1.  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  Impaired mobility -DVT/anticoagulation:  Continue SQ Lovenox             -antiplatelet therapy: ASA 325 mg/day 3. Pain Management: tylenol prn 4. Mood: LCSW to follow for evaluation and  support.              -antipsychotic agents: N/A 5. Neuropsych: This patient is not capable of making decisions on his own behalf. 6. Skin/Wound Care: Routine pressure relief measures.  7. Fluids/Electrolytes/Nutrition: NPO with tube feeds.             -reduced water flushes to 100 cc every 4 hours-- -recheck BMET monday  8. HTN: Monitor BP tid with SBP goal <140/90.             --continue losartan/day, cardura/day and Metoprolol every 6 hours  -bp controlled 6/17 9. T2DM: Hgb A1C- 6.2. well controlled 10. Lethargy: seems to wax and wane a bit which is expected given his CT scan  -continue Amantadine since 06/09 for activation. -will dc ritalin given wife's concerns. Wouldn't advise increasing it any further regardless given his CV hx. 11. PAF: Monitor HR tid--question DOAC 10 days post stroke (On 06/13?). --SDH post fall 07/27/20             --continue Tikosyn bid             --appreciate pharmacy help to keep K+>4.0/Mg levels  . 12. Thoracic aortic aneurysm: Followed by [facility]. 13. Induced hypernatremia: Improving with increase water flushes  6/16 138---f/u Monday 14. Hypokalemia: see #11  15. Urinary incontinence-  6/16 continue condom cath per pt's wife- gets agitated if gets wet at all-  16. Abdominal distention:  -improved with downward adjustment of TF/flushes  -moving bowels, actually have been loose--holding softeners, laxs        LOS: 6 days A FACE TO FACE EVALUATION WAS PERFORMED  Meredith Staggers 08/21/2020, 10:24 AM

## 2020-08-21 NOTE — Progress Notes (Signed)
Occupational Therapy TBI Note  Patient Details  Name: [name] [name] MRN: [id number] Date of Birth: 1940-07-06  Today's Date: 08/21/2020 OT Individual Time: 1325-1400 OT Individual Time Calculation (min): 35 min  and Today's Date: 08/21/2020 OT Missed Time: 25 Minutes Missed Time Reason: Nursing care   Short Term Goals: Week 1:  OT Short Term Goal 1 (Week 1): Pt will attend to ADL task for 1 minute with mod cueing OT Short Term Goal 2 (Week 1): Pt will don shirt with max A OT Short Term Goal 3 (Week 1): Pt will use BUE to bathe UB with mod A  Skilled Therapeutic Interventions/Progress Updates:     Pt received in  bed after missing 25 min of skilled OT d/t peri care and bed change. Pt continues to be severely internally and externally distracted and fixated on NG tube. Pt requires total A to assume EOB to assist with arousal. MAX A +2 to stand pivot transfer EOB<>TIS with short concise cues to improve with task understanding and initiation. Pt changes shirt with MAX A to doff and don. Good sequencing of task however d/t cog/perceptual deficits/motor planning/L innattention pt requires HOH A for dressing. Returned to bed exit alarm on and call light in reach  Pt left at end of session in bed with exit alarm on, call light in reach and all needs met   Therapy Documentation Precautions:  Precautions Precautions: Fall Precaution Comments: cortrak Restrictions Weight Bearing Restrictions: No General:   Vital Signs:   Pain:   Agitated Behavior Scale: TBI  Observation Details Observation Environment: pt room Start of observation period - Date: 08/21/20 Start of observation period - Time: 1335 End of observation period - Date: 08/21/20 End of observation period - Time: 1400 Agitated Behavior Scale (DO NOT LEAVE BLANKS) Short attention span, easy distractibility, inability to concentrate: Present to a moderate degree Impulsive, impatient, low tolerance for pain or frustration:  Present to a slight degree Uncooperative, resistant to care, demanding: Absent Violent and/or threatening violence toward people or property: Absent Explosive and/or unpredictable anger: Absent Rocking, rubbing, moaning, or other self-stimulating behavior: Present to a slight degree Pulling at tubes, restraints, etc.: Present to a moderate degree Wandering from treatment areas: Absent Restlessness, pacing, excessive movement: Present to a moderate degree Repetitive behaviors, motor, and/or verbal: Present to a moderate degree Rapid, loud, or excessive talking: Absent Sudden changes of mood: Absent Easily initiated or excessive crying and/or laughter: Absent Self-abusiveness, physical and/or verbal: Absent Agitated behavior scale total score: 24  ADL: ADL Eating: NPO Grooming: Dependent Where Assessed-Grooming: Bed level Upper Body Bathing: Dependent Where Assessed-Upper Body Bathing: Edge of bed Lower Body Bathing: Dependent Where Assessed-Lower Body Bathing: Bed level Upper Body Dressing: Dependent Lower Body Dressing: Dependent Where Assessed-Lower Body Dressing: Bed level Toileting: Unable to assess Where Assessed-Toileting: Glass blower/designer: Unable to assess Toilet Transfer Method: Unable to assess Vision   Perception    Praxis   Exercises:   Other Treatments:     Therapy/Group: Individual Therapy  Tonny Branch 08/21/2020, 9:00 AM

## 2020-08-21 NOTE — Progress Notes (Signed)
Cortrak Team Note:   Received page from RN stating that [place] tube had been pulled out somewhat and needed to be re-advanced.   Cortrak tube successfully re-advanced and re-bridled at 70 cm. RD used previously placed Cortrak and bridle.   X-ray is required, abdominal x-ray has been ordered by the Cortrak team. Please confirm tube placement before using the Cortrak tube.   If the tube becomes dislodged please keep the tube and contact the Cortrak team at [email or web address] (password [email or web address]) for replacement.  If after hours and replacement cannot be delayed, place a NG tube and confirm placement with an abdominal x-ray.    Kerman Passey MS, RDN, LDN, CNSC Registered Dietitian III Clinical Nutrition RD Pager and On-Call Pager Number Located in [place]

## 2020-08-21 NOTE — Progress Notes (Signed)
Speech Language Pathology Daily Session Note  Patient Details  Name: [name] [name] MRN: [id number] Date of Birth: 10-03-1940  Today's Date: 08/21/2020 SLP Individual Time: 0930-1015 SLP Individual Time Calculation (min): 45 min  Short Term Goals: Week 1: SLP Short Term Goal 1 (Week 1): Pt will consume therapeutic trials of ice chips and/or small amounts of water with minimal overt s/s of aspiration and max assist for use of swallowing precautions. SLP Short Term Goal 2 (Week 1): Pt will focus his attention to a targeted stimulus in >50% of opportunities with max assist multimodal cues. SLP Short Term Goal 3 (Week 1): Pt will maintain alertness for periods of >5 minutes to participate in basic ADLs with max assist multimodal cues. SLP Short Term Goal 4 (Week 1): Pt will use multimodal means of communication (gestures, head nod/shake, pointing, verbalizing, vocalizing) to convey his immediate needs and wants to caregivers with max assist multimodal cues.  Skilled Therapeutic Interventions: Skilled SLP treatment performed with focus on dysphagia and cognitive goals. Patient was sleeping in bed on arrival. Spouse was at bedside and reported patient has had a sleepy morning with difficulty staying awake. He required max verbal and tactile cues throughout session for arousal. A warm washcloth on face appeared to enhance arousal for short periods of time (~3-4 minutes). Patient consumed 4 trials of ice chips (1 small ice chip per trial) and dependent for therapist to facilitate presentation of bolus. Patient initiated a swallow during 2/4 trials. Although it did not appear a swallow was initiated during 2 of the trials, patient displayed attempts as evidenced by lingual pumping. Residuals were removed by suction. Coughing occurred x1. Mild anterior spillage on L. Due to minimal arousal, ice chip trials were discontinued for safety. Patient nodded head yes/no in response to questions about  wants/needs/preferences. He was unintelligible at word level. Patient was able to keep his eyes open during familiar song for approximately 1 minute intervals. SLP educated spouse to keep head of bed elevated secondary to visible drooling and oral secretions in oral cavity to reduce risk for aspiration. Spouse verbalized understanding through teach back. Patient was left upright in bed with alarm activated, spouse at bedside, and all needs within reach. Continue with current plan of care.    Pain Pain Assessment Pain Scale: 0-10 Pain Score: 0-No pain Faces Pain Scale: No hurt  Therapy/Group: Individual Therapy  Patty Sermons 08/21/2020, 12:22 PM

## 2020-08-21 NOTE — Progress Notes (Addendum)
Physical Therapy Session Note  Patient Details  Name: [name] [name] MRN: [id number] Date of Birth: 29-Apr-1940  Today's Date: 08/21/2020 PT Individual Time: 0800-0915 PT Individual Time Calculation (min): 75 min   Short Term Goals: Week 1:  PT Short Term Goal 1 (Week 1): Pt will perform supine<>sit with mod-max A of 1 person consistently. PT Short Term Goal 2 (Week 1): Pt will perform sit<>stands using LRAD with mod A +2 PT Short Term Goal 3 (Week 1): Pt will perform bed<>chair transfers using LRAD with max A of 1 person PT Short Term Goal 4 (Week 1): Patient will initiation gait training PT Short Term Goal 5 (Week 1): Patient will perform sitting balance with mod A consistently  Skilled Therapeutic Interventions/Progress Updates: Pt presents supine in bed and appears agreeable to therapy.  Pt attempting to answer questions.  Pt performed sup to sit transfer w/ max A and then required assist of 2 to maintain balance, gradually improving flexion to achieve upright status.  Pt performed sit to stand w/ assist of 2 using HHA.  Pt then performed SPT w/ assist of 2 to w/c w/ pushing to right.  Pt turned to sink as pantomimes brushing teeth.  Pt leaned forward to brush teeth, verbal cues for performing back teeth.  Pt attempting to reach water but not reaching , given warm washcloth and washes mouth and eyes, but manual assist for complete face.  Pt continue to pull at L finger.  Pt performed sit to stand w/ assist of 2 at RW, but only amb x 3-4', unable to grasp L hand today w/o manual placement.  Pt returned to bed via Stedy and then max A for sit to supine.  Spouse present in room during therapy session.  350 ml of urine drained from catheter and charted by NT.     Therapy Documentation Precautions:  Precautions Precautions: Fall Precaution Comments: cortrak Restrictions Weight Bearing Restrictions: No General:   Vital Signs:   Pain: appears pain-free.       Therapy/Group: Individual  Therapy  Ladoris Gene 08/21/2020, 9:16 AM

## 2020-08-21 NOTE — Progress Notes (Signed)
Cortrak marking at 56 at the nare. Will hold melds at this time until cortrak team arrives. Sheela Stack, LPN

## 2020-08-21 NOTE — Discharge Instructions (Addendum)
Inpatient Rehab Discharge Instructions  [name] [name] Discharge date and time:    Activities/Precautions/ Functional Status: Activity: no lifting, driving, or strenuous exercise till  cleared by MD Diet:  Wound Care: keep wound clean and dry   Functional status:  ___ No restrictions     ___ Walk up steps independently _X__ 24/7 supervision/assistance   ___ Walk up steps with assistance ___ Intermittent supervision/assistance  ___ Bathe/dress independently ___ Walk with walker     __X_ Bathe/dress with assistance ___ Walk Independently    ___ Shower independently ___ Walk with assistance    ___ Shower with assistance _X__ No alcohol     ___ Return to work/school ________  Special Instructions:    My questions have been answered and I understand these instructions. I will adhere to these goals and the provided educational materials after my discharge from the hospital.  Patient/Caregiver Signature _______________________________ Date __________  Clinician Signature _______________________________________ Date __________  Please bring this form and your medication list with you to all your follow-up doctor's appointments.     ------------------------------------------------------------------------------------------------------------------------------------------- Information on my medicine - ELIQUIS (apixaban)  This medication education was reviewed with me or my healthcare representative as part of my discharge preparation.     Why was Eliquis prescribed for you? Eliquis was prescribed for you to reduce the risk of a blood clot forming that can cause a stroke if you have a medical condition called atrial fibrillation (a type of irregular heartbeat).  What do You need to know about Eliquis ? Take your Eliquis TWICE DAILY - one tablet in the morning and one tablet in the evening with or without food. If you have difficulty swallowing the tablet whole please discuss with  your pharmacist how to take the medication safely.  Take Eliquis exactly as prescribed by your doctor and DO NOT stop taking Eliquis without talking to the doctor who prescribed the medication.  Stopping may increase your risk of developing a stroke.  Refill your prescription before you run out.  After discharge, you should have regular check-up appointments with your healthcare provider that is prescribing your Eliquis.  In the future your dose may need to be changed if your kidney function or weight changes by a significant amount or as you get older.  What do you do if you miss a dose? If you miss a dose, take it as soon as you remember on the same day and resume taking twice daily.  Do not take more than one dose of ELIQUIS at the same time to make up a missed dose.  Important Safety Information A possible side effect of Eliquis is bleeding. You should call your healthcare provider right away if you experience any of the following: Bleeding from an injury or your nose that does not stop. Unusual colored urine (red or dark brown) or unusual colored stools (red or black). Unusual bruising for unknown reasons. A serious fall or if you hit your head (even if there is no bleeding).  Some medicines may interact with Eliquis and might increase your risk of bleeding or clotting while on Eliquis. To help avoid this, consult your healthcare provider or pharmacist prior to using any new prescription or non-prescription medications, including herbals, vitamins, non-steroidal anti-inflammatory drugs (NSAIDs) and supplements.  This website has more information on Eliquis (apixaban): http://www.eliquis.com/eliquis/home

## 2020-08-22 ENCOUNTER — Inpatient Hospital Stay (HOSPITAL_COMMUNITY): Payer: Medicare Other

## 2020-08-22 LAB — URINALYSIS, ROUTINE W REFLEX MICROSCOPIC
Bacteria, UA: NONE SEEN
Bilirubin Urine: NEGATIVE
Glucose, UA: NEGATIVE mg/dL
Hgb urine dipstick: NEGATIVE
Ketones, ur: NEGATIVE mg/dL
Nitrite: POSITIVE — AB
Protein, ur: NEGATIVE mg/dL
Specific Gravity, Urine: 1.01 (ref 1.005–1.030)
pH: 7 (ref 5.0–8.0)

## 2020-08-22 LAB — GLUCOSE, CAPILLARY
Glucose-Capillary: 106 mg/dL — ABNORMAL HIGH (ref 70–99)
Glucose-Capillary: 107 mg/dL — ABNORMAL HIGH (ref 70–99)
Glucose-Capillary: 119 mg/dL — ABNORMAL HIGH (ref 70–99)
Glucose-Capillary: 146 mg/dL — ABNORMAL HIGH (ref 70–99)
Glucose-Capillary: 151 mg/dL — ABNORMAL HIGH (ref 70–99)
Glucose-Capillary: 153 mg/dL — ABNORMAL HIGH (ref 70–99)

## 2020-08-22 NOTE — Progress Notes (Addendum)
PROGRESS NOTE   Subjective/Complaints:  Pt in bed awake, trying to remove mitts  ROS: Limited due to cognitive/behavioral     Objective:   DG Abd 1 View  Result Date: 08/20/2020 CLINICAL DATA:  Abdominal distension EXAM: ABDOMEN - 1 VIEW COMPARISON:  08/10/2020 FINDINGS: Soft feeding tube tip in the region of the antrum or proximal duodenum. No evidence of bowel obstruction. Possible mild ileus pattern. No acute bone finding. IMPRESSION: Soft feeding tube in place. Question mild ileus pattern. Findings not suggestive of obstruction. Electronically Signed   By: Nelson Chimes M.D.   On: 08/20/2020 11:35   DG Abd Portable 1V  Result Date: 08/21/2020 CLINICAL DATA:  NG placement EXAM: PORTABLE ABDOMEN - 1 VIEW COMPARISON:  08/20/2020 FINDINGS: Feeding tube is been advanced with the tip now in the region of the gastric antrum. Normal bowel gas pattern. IMPRESSION: Feeding tube tip advanced to the gastric antral region. Electronically Signed   By: Franchot Gallo M.D.   On: 08/21/2020 10:07   DG Abd Portable 1V  Result Date: 08/20/2020 CLINICAL DATA:  OG tube placement EXAM: PORTABLE ABDOMEN - 1 VIEW COMPARISON:  08/20/2020 FINDINGS: Enteric tube tip is in the left upper quadrant consistent with location in the upper stomach. Scattered gas in normal caliber small and large bowel. IMPRESSION: Enteric tube tip is in the left upper quadrant consistent with location in the upper stomach. Electronically Signed   By: Lucienne Capers M.D.   On: 08/20/2020 20:45   No results for input(s): WBC, HGB, HCT, PLT in the last 72 hours.  Recent Labs    08/20/20 0405  NA 138  K 3.6  CL 105  CO2 27  GLUCOSE 164*  BUN 16  CREATININE 0.75  CALCIUM 8.4*     Intake/Output Summary (Last 24 hours) at 08/22/2020 0850 Last data filed at 08/22/2020 0730 Gross per 24 hour  Intake 0 ml  Output 700 ml  Net -700 ml         Physical Exam: Vital  Signs Blood pressure (!) 147/86, pulse 84, temperature 98 F (36.7 C), temperature source Oral, resp. rate (!) 22, height 5\' 7"  (1.702 m), weight 86 kg, SpO2 98 %.  General: No acute distress Mood and affect are appropriate Heart: Regular rate and rhythm no rubs murmurs or extra sounds Lungs: Clear to auscultation, breathing unlabored, no rales or wheezes Abdomen: Positive bowel sounds, soft nontender to palpation, nondistended Extremities: No clubbing, cyanosis, or edema Skin: No evidence of breakdown, no evidence of rash   Musculoskeletal:    Cervical back: no axial or limb pain     Skin:    Comments: Some ecchymoses on arms B/L - no backside breakdown,scalp incision closed Neurological: alert, restless, does not respond to greeting and commands, trying to remove mitts Right gaze preference.. Moves right side spontaneously, remains mostly non-verbal with intermittent grunts or short phrases.  Unchanged   Moves all 4s vs gravity but does not cooperate with MMT     Assessment/Plan: 1. Functional deficits which require 3+ hours per day of interdisciplinary therapy in a comprehensive inpatient rehab setting. Physiatrist is providing close team supervision and 24 hour management of  active medical problems listed below. Physiatrist and rehab team continue to assess barriers to discharge/monitor patient progress toward functional and medical goals  Care Tool:  Bathing    Body parts bathed by patient: Chest, Abdomen, Front perineal area, Face   Body parts bathed by helper: Right arm, Left arm, Right lower leg, Left lower leg, Buttocks, Right upper leg, Left upper leg     Bathing assist Assist Level: Maximal Assistance - Patient 24 - 49%     Upper Body Dressing/Undressing Upper body dressing   What is the patient wearing?: Pull over shirt    Upper body assist Assist Level: Maximal Assistance - Patient 25 - 49%    Lower Body Dressing/Undressing Lower body dressing      What  is the patient wearing?: Pants, Incontinence brief     Lower body assist Assist for lower body dressing: Total Assistance - Patient < 25%     Toileting Toileting    Toileting assist Assist for toileting: Dependent - Patient 0%     Transfers Chair/bed transfer  Transfers assist     Chair/bed transfer assist level: 2 Helpers (HHA and Stedy for return to bed, fatigue.)     Locomotion Ambulation   Ambulation assist   Ambulation activity did not occur: Safety/medical concerns  Assist level: 2 helpers Assistive device: Walker-rolling Max distance: 3   Walk 10 feet activity   Assist  Walk 10 feet activity did not occur: Safety/medical concerns  Assist level: 2 helpers Assistive device: No Device   Walk 50 feet activity   Assist Walk 50 feet with 2 turns activity did not occur: Safety/medical concerns         Walk 150 feet activity   Assist Walk 150 feet activity did not occur: Safety/medical concerns         Walk 10 feet on uneven surface  activity   Assist Walk 10 feet on uneven surfaces activity did not occur: Safety/medical Armed forces technical officer activity did not occur: Safety/medical concerns         Wheelchair 50 feet with 2 turns activity    Assist    Wheelchair 50 feet with 2 turns activity did not occur: Safety/medical concerns       Wheelchair 150 feet activity     Assist  Wheelchair 150 feet activity did not occur: Safety/medical concerns       Blood pressure (!) 147/86, pulse 84, temperature 98 F (36.7 C), temperature source Oral, resp. rate (!) 22, height 5\' 7"  (1.702 m), weight 86 kg, SpO2 98 %.  Medical Problem List and Plan: 1.  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  Impaired mobility -DVT/anticoagulation:  Continue SQ Lovenox             -antiplatelet therapy: ASA 325 mg/day 3. Pain Management: tylenol prn 4. Mood: LCSW to follow for evaluation and support.              -  antipsychotic agents: N/A 5. Neuropsych: This patient is not capable of making decisions on his own behalf. 6. Skin/Wound Care: Routine pressure relief measures.  7. Fluids/Electrolytes/Nutrition: NPO with tube feeds.             -reduced water flushes to 100 cc every 4 hours-- -recheck BMET monday  8. HTN: Monitor BP tid with SBP goal <140/90.             --continue losartan/day, cardura/day and Metoprolol every 6 hours  - Vitals:   08/21/20 1912 08/22/20 0429  BP: 114/61 (!) 147/86  Pulse: 74 84  Resp: 20 (!) 22  Temp: 98.8 F (37.1 C) 98 F (36.7 C)  SpO2: 93% 98%   Controlled 6/18 9. T2DM: Hgb A1C- 6.2. well controlled 6/18 CBG (last 3)  Recent Labs    08/22/20 0015 08/22/20 0422 08/22/20 0818  GLUCAP 119* 146* 153*    10. Lethargy: seems to wax and wane a bit which is expected given his CT scan  -continue Amantadine since 06/09 for activation. -will dc ritalin given wife's concerns. Wouldn't advise increasing it any further regardless given his CV hx.  11. PAF: Monitor HR tid--question date DOAC resumption hold off until repeat CT scan on Monday reviewed by neuro --SDH post fall 07/27/20             --continue Tikosyn bid             --appreciate pharmacy help to keep K+>4.0/Mg levels  . 12. Thoracic aortic aneurysm: Followed by [facility]. 13. Induced hypernatremia: Improving with increase water flushes  6/16 138---f/u Monday 14. Hypokalemia: see #11  15. Urinary incontinence-  6/16 continue condom cath per pt's wife- gets agitated if gets wet at all-  Urine cx, UA today may cath 16. Abdominal  distention:  -improved with downward adjustment of TF/flushes  -moving bowels, actually have been loose--holding softeners, laxs    Spoke with wife reviewed chart     LOS: 7 days A FACE TO FACE EVALUATION WAS PERFORMED  Charlett Blake 08/22/2020, 8:50 AM

## 2020-08-22 NOTE — Progress Notes (Signed)
Speech Language Pathology Daily Session Note  Patient Details  Name: [name] [name] MRN: [id number] Date of Birth: [birth year]  Today's Date: 08/22/2020 SLP Individual Time: 1433-1500 SLP Individual Time Calculation (min): 27 min  Short Term Goals: Week 1: SLP Short Term Goal 1 (Week 1): Pt will consume therapeutic trials of ice chips and/or small amounts of water with minimal overt s/s of aspiration and max assist for use of swallowing precautions. SLP Short Term Goal 2 (Week 1): Pt will focus his attention to a targeted stimulus in >50% of opportunities with max assist multimodal cues. SLP Short Term Goal 3 (Week 1): Pt will maintain alertness for periods of >5 minutes to participate in basic ADLs with max assist multimodal cues. SLP Short Term Goal 4 (Week 1): Pt will use multimodal means of communication (gestures, head nod/shake, pointing, verbalizing, vocalizing) to convey his immediate needs and wants to caregivers with max assist multimodal cues.  Skilled Therapeutic Interventions: Pt seen for skilled ST with focus on swallow and communication goals. Pt was awake upon entry with wife and son present. Pt re-adjusted in bed to perform oral care and PO trials. SLP initiating oral care, pt grabbing suction swab to complete with mod A for thoroughness. Pt consuming 9 ice chips with delayed, wet cough following first 3 trials, likely loosening secretions that patient eventually seemed to clear. No further s/s aspiration with ice chip trials, however pt very distracted with blankets and NG tube resulting in poor bolus awareness and delayed swallow at times. Pt able to follow 1-step directions in 50% of opportunities and demonstrated spontaneous language such as "it was a pleasure working with you", "yes ma'am" and "I'm ready". Vocal quality breathy and hoarse. Pt left in bed with family present and all needs met. Cont ST POC.   Pain Pain Assessment Pain Scale: 0-10 Pain Score: 0-No  pain  Therapy/Group: Individual Therapy  Dewaine Conger 08/22/2020, 3:43 PM

## 2020-08-22 NOTE — Progress Notes (Signed)
Occupational Therapy TBI Note  Patient Details  Name: [name] [name] MRN: [id number] Date of Birth: Feb 07, 1941  Today's Date: 08/22/2020 OT Individual Time: 3338-3291 OT Individual Time Calculation (min): 44 min    Short Term Goals: Week 1:  OT Short Term Goal 1 (Week 1): Pt will attend to ADL task for 1 minute with mod cueing OT Short Term Goal 2 (Week 1): Pt will don shirt with max A OT Short Term Goal 3 (Week 1): Pt will use BUE to bathe UB with mod A  Skilled Therapeutic Interventions/Progress Updates:     Pt received in bed with 0 out of 10 pain.    Therapeutic activity Pt requires sternal rub for arousal and total A to assume sitting EOB. Pt completes sitting balance activity reaching R away from posterior/L bias with total cuing and initiation of reaching 30% of time d/t internal and external distraction and perseverative pattern of crossing Les into figure 4. Pt requires max-total 1-2 for sitting EOB. 6 sit to stands initially with MAX A to power up and decreasing to MOD A to power up with wife cuing for cervical extension and OT facilitating hip extension.  Pt left at end of session in bed with exit alarm on, call light in reach and all needs met   Therapy Documentation Precautions:  Precautions Precautions: Fall Precaution Comments: cortrak Restrictions Weight Bearing Restrictions: No General:   Vital Signs: Therapy Vitals Temp: 98 F (36.7 C) Temp Source: Oral Pulse Rate: 84 Resp: (!) 22 BP: (!) 147/86 Patient Position (if appropriate): Lying Oxygen Therapy SpO2: 98 % O2 Device: Room Air Pain:   Agitated Behavior Scale: TBI  Observation Details Observation Environment: pt room Start of observation period - Date: 08/22/20 Start of observation period - Time: 0915 End of observation period - Date: 08/22/20 End of observation period - Time: 1000 Agitated Behavior Scale (DO NOT LEAVE BLANKS) Short attention span, easy distractibility, inability to  concentrate: Present to a moderate degree Impulsive, impatient, low tolerance for pain or frustration: Present to a slight degree Uncooperative, resistant to care, demanding: Absent Violent and/or threatening violence toward people or property: Absent Explosive and/or unpredictable anger: Absent Rocking, rubbing, moaning, or other self-stimulating behavior: Absent Pulling at tubes, restraints, etc.: Present to a slight degree Wandering from treatment areas: Absent Restlessness, pacing, excessive movement: Present to a slight degree Repetitive behaviors, motor, and/or verbal: Present to a slight degree Rapid, loud, or excessive talking: Absent Sudden changes of mood: Absent Easily initiated or excessive crying and/or laughter: Absent Self-abusiveness, physical and/or verbal: Absent Agitated behavior scale total score: 20  ADL: ADL Eating: NPO Grooming: Dependent Where Assessed-Grooming: Bed level Upper Body Bathing: Dependent Where Assessed-Upper Body Bathing: Edge of bed Lower Body Bathing: Dependent Where Assessed-Lower Body Bathing: Bed level Upper Body Dressing: Dependent Lower Body Dressing: Dependent Where Assessed-Lower Body Dressing: Bed level Toileting: Unable to assess Where Assessed-Toileting: Glass blower/designer: Unable to assess Toilet Transfer Method: Unable to assess Vision   Perception    Praxis   Exercises:   Other Treatments:     Therapy/Group: Individual Therapy  Tonny Branch 08/22/2020, 6:49 AM

## 2020-08-23 LAB — GLUCOSE, CAPILLARY
Glucose-Capillary: 137 mg/dL — ABNORMAL HIGH (ref 70–99)
Glucose-Capillary: 162 mg/dL — ABNORMAL HIGH (ref 70–99)
Glucose-Capillary: 101 mg/dL — ABNORMAL HIGH (ref 70–99)
Glucose-Capillary: 140 mg/dL — ABNORMAL HIGH (ref 70–99)
Glucose-Capillary: 114 mg/dL — ABNORMAL HIGH (ref 70–99)

## 2020-08-23 MED ORDER — MELATONIN 3 MG PO TABS: 3.0000 mg | ORAL_TABLET | Freq: Every day | ORAL | Status: DC

## 2020-08-23 MED ORDER — CEPHALEXIN 250 MG PO CAPS
250.0000 mg | ORAL_CAPSULE | Freq: Four times a day (QID) | ORAL | Status: DC
  Administered 2020-08-23 – 2020-08-30 (×27): 250 mg
  Filled 2020-08-23 (×28): qty 1

## 2020-08-23 MED ORDER — MELATONIN 3 MG PO TABS
3.0000 mg | ORAL_TABLET | Freq: Every day | ORAL | Status: DC
  Administered 2020-08-23 – 2020-08-27 (×5): 3 mg
  Filled 2020-08-23 (×5): qty 1

## 2020-08-23 MED ORDER — CEPHALEXIN 250 MG PO CAPS
250.0000 mg | ORAL_CAPSULE | Freq: Four times a day (QID) | ORAL | Status: DC
  Administered 2020-08-23: 250 mg via ORAL
  Filled 2020-08-23: qty 1

## 2020-08-23 NOTE — Progress Notes (Signed)
PROGRESS NOTE   Subjective/Complaints:  Family states pt more interactive since yesterday am, discussed UA results Son and wife at bedise Pt has occasionally mad a short sentence per family   ROS: Limited due to cognitive/behavioral     Objective:   CT HEAD WO CONTRAST  Result Date: 08/22/2020 CLINICAL DATA:  History of right MCA infarct and right-sided subdural hematoma. EXAM: CT HEAD WITHOUT CONTRAST TECHNIQUE: Contiguous axial images were obtained from the base of the skull through the vertex without intravenous contrast. COMPARISON:  08/17/2020 FINDINGS: Brain: There is continued evolution of the right MCA territory infarct seen previously. Decreased cortical edema and sulcal effacement. Continued leftward midline shift measuring approximately 7 mm at the level of the septum pellucidum. Chronic small vessel ischemic changes are seen throughout the periventricular white matter. Lateral ventricles and midline structures are stable. The small residual right-sided subdural hematoma deep to the craniotomy site is unchanged, measuring less than 5 mm. Vascular: No hyperdense vessel or unexpected calcification. Skull: Stable postsurgical changes from right-sided craniotomy. No acute bony abnormality. Sinuses/Orbits: Small gas fluid level right maxillary sinus. Remaining sinuses are clear. Other: None. IMPRESSION: 1. Continued evolution of the right MCA territory infarct, with decreasing sulcal effacement but persistent leftward midline shift. 2. Stable small residual right hemispheric subdural hematoma, measuring less than 5 mm in thickness. Electronically Signed   By: Randa Ngo M.D.   On: 08/22/2020 21:22   No results for input(s): WBC, HGB, HCT, PLT in the last 72 hours.  No results for input(s): NA, K, CL, CO2, GLUCOSE, BUN, CREATININE, CALCIUM in the last 72 hours.   Intake/Output Summary (Last 24 hours) at 08/23/2020 1105 Last data  filed at 08/23/2020 9798 Gross per 24 hour  Intake 0 ml  Output 2750 ml  Net -2750 ml         Physical Exam: Vital Signs Blood pressure 97/70, pulse 76, temperature 98.1 F (36.7 C), temperature source Oral, resp. rate 16, height 5\' 7"  (1.702 m), weight 86 kg, SpO2 98 %.   General: No acute distress Mood and affect are appropriate Heart: Regular rate and rhythm no rubs murmurs or extra sounds Lungs: Clear to auscultation, breathing unlabored, no rales or wheezes Abdomen: Positive bowel sounds, soft nontender to palpation, nondistended, no suprapubic tenderness  Extremities: No clubbing, cyanosis, or edema Skin: No evidence of breakdown, no evidence of rash  Musculoskeletal:    Cervical back: no axial or limb pain     Skin:    Comments: Some ecchymoses on arms B/L - no backside breakdown,scalp incision closed Neurological: alert, restless, follows simple commands for MMT today  Right gaze preference.. Moves right side spontaneously, remains mostly non-verbal with intermittent grunts or short phrases.  Unchanged     Assessment/Plan: 1. Functional deficits which require 3+ hours per day of interdisciplinary therapy in a comprehensive inpatient rehab setting. Physiatrist is providing close team supervision and 24 hour management of active medical problems listed below. Physiatrist and rehab team continue to assess barriers to discharge/monitor patient progress toward functional and medical goals  Care Tool:  Bathing    Body parts bathed by patient: Chest, Abdomen, Front perineal area, Face  Body parts bathed by helper: Right arm, Left arm, Right lower leg, Left lower leg, Buttocks, Right upper leg, Left upper leg     Bathing assist Assist Level: Maximal Assistance - Patient 24 - 49%     Upper Body Dressing/Undressing Upper body dressing   What is the patient wearing?: Pull over shirt    Upper body assist Assist Level: Maximal Assistance - Patient 25 - 49%    Lower  Body Dressing/Undressing Lower body dressing      What is the patient wearing?: Pants, Incontinence brief     Lower body assist Assist for lower body dressing: Total Assistance - Patient < 25%     Toileting Toileting    Toileting assist Assist for toileting: Dependent - Patient 0%     Transfers Chair/bed transfer  Transfers assist     Chair/bed transfer assist level: 2 Helpers (HHA and Stedy for return to bed, fatigue.)     Locomotion Ambulation   Ambulation assist   Ambulation activity did not occur: Safety/medical concerns  Assist level: 2 helpers Assistive device: Walker-rolling Max distance: 3   Walk 10 feet activity   Assist  Walk 10 feet activity did not occur: Safety/medical concerns  Assist level: 2 helpers Assistive device: No Device   Walk 50 feet activity   Assist Walk 50 feet with 2 turns activity did not occur: Safety/medical concerns         Walk 150 feet activity   Assist Walk 150 feet activity did not occur: Safety/medical concerns         Walk 10 feet on uneven surface  activity   Assist Walk 10 feet on uneven surfaces activity did not occur: Safety/medical Armed forces technical officer activity did not occur: Safety/medical concerns         Wheelchair 50 feet with 2 turns activity    Assist    Wheelchair 50 feet with 2 turns activity did not occur: Safety/medical concerns       Wheelchair 150 feet activity     Assist  Wheelchair 150 feet activity did not occur: Safety/medical concerns       Blood pressure 97/70, pulse 76, temperature 98.1 F (36.7 C), temperature source Oral, resp. rate 16, height 5\' 7"  (1.702 m), weight 86 kg, SpO2 98 %.  Medical Problem List and Plan: 1.  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  Impaired mobility -DVT/anticoagulation:  Continue SQ Lovenox             -antiplatelet therapy: ASA 325 mg/day 3. Pain Management: tylenol prn 4. Mood: LCSW to follow for evaluation and support.              -antipsychotic agents: N/A 5. Neuropsych: This patient is not capable of making decisions on his own behalf. 6. Skin/Wound Care: Routine pressure relief measures.  7. Fluids/Electrolytes/Nutrition: NPO with tube feeds.             -  reduced water flushes to 100 cc every 4 hours-- -recheck BMET monday  8. HTN: Monitor BP tid with SBP goal <140/90.             --continue losartan/day, cardura/day and Metoprolol every 6 hours  - Vitals:   08/23/20 0000 08/23/20 0426  BP: 115/82 97/70  Pulse: 76 76  Resp:  16  Temp:  98.1 F (36.7 C)  SpO2:  98%   Controlled 6/19 9. T2DM: Hgb A1C- 6.2. well controlled 6/18 CBG (last 3)  Recent Labs    08/22/20 2001 08/23/20 0422 08/23/20 0832  GLUCAP 107* 137* 162*     10. Lethargy: seems to wax and wane a bit which is expected given his CT scan  -continue Amantadine since 06/09 for activation. -will dc ritalin given wife's concerns. Wouldn't advise increasing it any further regardless given his CV hx.  11. PAF: Monitor HR tid--question date DOAC resumption hold off until repeat CT scan on Monday reviewed by neuro --SDH post fall 07/27/20             --continue Tikosyn bid             --appreciate pharmacy help to keep K+>4.0/Mg levels  . 12. Thoracic aortic aneurysm: Followed by [facility]. 13. Induced hypernatremia: Improving with increase water flushes  6/16 138---f/u Monday 14. Hypokalemia: see #11  15. Urinary incontinence-   Await Urine cx, UA + start Keflex 16. Abdominal  distention:  -improved with downward adjustment of TF/flushes  -moving bowels, actually have been loose--holding softeners, laxs    Spoke with wife reviewed chart   17.  Insomnia trial melatonin  LOS: 8 days A FACE TO FACE EVALUATION WAS PERFORMED  Charlett Blake 08/23/2020, 11:05 AM

## 2020-08-23 NOTE — Progress Notes (Signed)
Physical Therapy Weekly Progress Note  Patient Details  Name: [name] [name] MRN: [id number] Date of Birth: March 15, 1940  Beginning of progress report period: August 16, 2020 End of progress report period: August 23, 2020  Today's Date: 08/23/2020 PT Individual Time: 1110-1230 PT Individual Time Calculation (min): 80 min   Patient has met 4 of 5 short term goals.  Patient with steady progress this week. Currently performing bed mobility with max a to sit up and mod-min A to return to lying, sit to stand transfers with mod A +1-2 using RW, max A without AD, lateral scoot transfer with mod-min A to the R today, and ambulating ~100 ft with mod A +1-2 using RW with w/c follow and visual stimulation. Presents with L lean in sitting and standing with pushing from R lower extremity in standing. Demonstrated poor motor planning with turning in standing requiring max A +2 to complete 90 deg turns. Demonstrates upper extremity apraxia and fisting with L hand with increased physical demand with functional tasks.Patient demonstrates improved arousal, attention, and initiation throughout the week requiring mod-max cues to attend to tasks with 0-10 sec for initiation once attention obtained for familiar functional tasks, unable to initiate or follow cues for novel tasks at this time. Patient with intermittent verbalizations often needs max encouragement to verbalize with <25% success and spontaneous verbalizations 1-2x per session.   Patient continues to demonstrate the following deficits muscle weakness, decreased cardiorespiratoy endurance, impaired timing and sequencing, unbalanced muscle activation, motor apraxia, decreased coordination, and decreased motor planning, decreased midline orientation and decreased attention to left, decreased initiation, decreased attention, decreased awareness, decreased problem solving, decreased safety awareness, decreased memory, and delayed processing, and decreased sitting balance,  decreased standing balance, decreased postural control, hemiplegia, decreased balance strategies, and difficulty maintaining precautions and therefore will continue to benefit from skilled PT intervention to increase functional independence with mobility.  Patient progressing toward long term goals..  Continue plan of care.  PT Short Term Goals Week 1:  PT Short Term Goal 1 (Week 1): Pt will perform supine<>sit with mod-max A of 1 person consistently. PT Short Term Goal 1 - Progress (Week 1): Met PT Short Term Goal 2 (Week 1): Pt will perform sit<>stands using LRAD with mod A +2 PT Short Term Goal 2 - Progress (Week 1): Met PT Short Term Goal 3 (Week 1): Pt will perform bed<>chair transfers using LRAD with max A of 1 person PT Short Term Goal 3 - Progress (Week 1): Met PT Short Term Goal 4 (Week 1): Patient will initiation gait training PT Short Term Goal 4 - Progress (Week 1): Met PT Short Term Goal 5 (Week 1): Patient will perform sitting balance with mod A consistently PT Short Term Goal 5 - Progress (Week 1): Progressing toward goal Week 2:  PT Short Term Goal 1 (Week 2): Patient will perform sitting balance with mod A consistently. PT Short Term Goal 2 (Week 2): Patient will tolerate safely sitting OOB >30 min 3/7 days this week. PT Short Term Goal 3 (Week 2): Patient will perform basic transfers with mod A and mod cues consistently. PT Short Term Goal 4 (Week 2): Patient will ambulate >100 ft with mod A using LRAD consistently.  Skilled Therapeutic Interventions/Progress Updates:     Patient in bed with is wife and son at bed side upon PT arrival. Patient alert for >75% of session, then required min-max cues to maintain arousal due to fatigue. Patient did not display any signs of pain  throughout session, patient did not provide response when asked due to cognitive deficits.   MD rounded at beginning of session. Patient's wife expressed concerns about the patient's decline over the past  2 days and poor sleep and was educated on UTI and treatment as well as sleep aids to assist patient at night. PT followed-up on MD education with changes in function with brain injury when the body is under stress, such as fighting an infection, or with fatigue, due to lack of sleep. Discussed posting a sleep chart outside the patient's door with LPN at end of session.   Patient perseverated on wiping his nose and brushing his teeth throughout session. Provided a washcloth for patient to wipe his nose or mouth and suction for secretions several times during session.   Therapeutic Activity: Bed Mobility: Patient performed supine to sit with mod-max A for lower extremity management and trunk control due to posterior lean. Patient self-initiated rolling toward his family to the L then required max cues for bringing his legs off the bed and min cues to push through his arms to come to sitting. Patient sat EOB with mod A. Patient pushed away polo shirt that was provided by his wife, then selected, by grabbing, a blue t-shirt from two options held in front of him. Donned shirt with mod-max a due to upper extremity apraxia, demonstrating follow-through using backwards chaining techniques with mod cues. Required mod-max A for sitting balance from patient's son while donning shirt with PT. Transfers: Patient performed sit to/from stand x1 with max A due to posterior lean without AD, able to progress to mod A in standing, but unable to initiate turning to sit in TIS w/c despite max verbal, tactile, and visual cues. He performed scooting back on the bed x2 with mod-min A due to landing close to EOB following transfer attempt. He then performed lateral scoot bed<>TIS w/c with mod-min A to the R, using R hand to reach for the arm rest or bed rail to promote forward trunk flexion and reduce pushing with R leg. Provided mod multimodal cues for sequencing, hand placement with hand over hand facilitation, and head-hips  relationship.  Neuromuscular Re-ed: Patient performed the following activities: -Patient performed sitting balance 2x3-4 min sitting EOB. First trial, progressed from max-mod/min A with focus on midline orientation with mod cues to move his R shoulder toward a visual target on the R to correct L lean and reaching to grab therapist's knee to promote forward flexion to reduce posterior bias. Patient would follow cues, but was unable to sustain his attention on the task to maintain balance >5 sec. On second trial, patient performed sitting balance with min A progressing to supervision focused on visual scanning from R to L and reaching with either upper extremity to locate the named family member or PT, correct 3/4 tials. Patient unable to attend to cues on last trial. -Patient ambulated 100 feet using RW with mod-min A with +2 for w/c follow and his son standing in front to provide visual stimulation and verbal encouragement to keep patient engaged and attentive during activity. Ambulated with improved reciprocal gait patter, mild L lean, decreased gait speed, step height and step length, increased L knee flexion, reduced L stance time, and forward trunk flexion. Provided mod-max verbal cues for erect posture by looking at his son, manual facilitation for weight shift L>R, and mod-max a for advancing RW to promote forward propulsion during gait.  Manual Therapy: -cervical rotation PROM with gentle over pressure  and upper trap stretch with facilitation at the opposite acromion 2x1 min R/L with improved ROM from ~20 deg to ~45 deg on each side -cervical grade III/IV P/A mobilization to C2-7 x45 sec bouts -cervical protraction/retraction PROM x5 -soft tissue mobilization of suboccipitals, upper traps, and splenius capitis for improved tissue mobility with PROM performed  Patient in bed with his wife and son at bedside at end of session with breaks locked, bed alarm set, and all needs within reach.    Therapy Documentation Precautions:  Precautions Precautions: Fall Precaution Comments: cortrak Restrictions Weight Bearing Restrictions: No    Therapy/Group: Individual Therapy  Keatin Benham L Hinley Brimage PT, DPT  08/23/2020, 3:44 PM

## 2020-08-24 ENCOUNTER — Inpatient Hospital Stay (HOSPITAL_COMMUNITY): Payer: Medicare Other

## 2020-08-24 LAB — GLUCOSE, CAPILLARY
Glucose-Capillary: 115 mg/dL — ABNORMAL HIGH (ref 70–99)
Glucose-Capillary: 130 mg/dL — ABNORMAL HIGH (ref 70–99)
Glucose-Capillary: 135 mg/dL — ABNORMAL HIGH (ref 70–99)
Glucose-Capillary: 138 mg/dL — ABNORMAL HIGH (ref 70–99)
Glucose-Capillary: 145 mg/dL — ABNORMAL HIGH (ref 70–99)
Glucose-Capillary: 146 mg/dL — ABNORMAL HIGH (ref 70–99)
Glucose-Capillary: 147 mg/dL — ABNORMAL HIGH (ref 70–99)
Glucose-Capillary: 160 mg/dL — ABNORMAL HIGH (ref 70–99)

## 2020-08-24 LAB — CBC
HCT: 37.3 % — ABNORMAL LOW (ref 39.0–52.0)
Hemoglobin: 11.8 g/dL — ABNORMAL LOW (ref 13.0–17.0)
MCH: 30.4 pg (ref 26.0–34.0)
MCHC: 31.6 g/dL (ref 30.0–36.0)
MCV: 96.1 fL (ref 80.0–100.0)
Platelets: 428 10*3/uL — ABNORMAL HIGH (ref 150–400)
RBC: 3.88 MIL/uL — ABNORMAL LOW (ref 4.22–5.81)
RDW: 13.5 % (ref 11.5–15.5)
WBC: 9.1 10*3/uL (ref 4.0–10.5)
nRBC: 0 % (ref 0.0–0.2)

## 2020-08-24 LAB — BASIC METABOLIC PANEL
Anion gap: 9 (ref 5–15)
BUN: 20 mg/dL (ref 8–23)
CO2: 25 mmol/L (ref 22–32)
Calcium: 9 mg/dL (ref 8.9–10.3)
Chloride: 104 mmol/L (ref 98–111)
Creatinine, Ser: 0.85 mg/dL (ref 0.61–1.24)
GFR, Estimated: >60 mL/min (ref >60)
Glucose, Bld: 147 mg/dL — ABNORMAL HIGH (ref 70–99)
Potassium: 3.9 mmol/L (ref 3.5–5.1)
Sodium: 138 mmol/L (ref 135–145)

## 2020-08-24 LAB — MAGNESIUM: Magnesium: 2.1 mg/dL (ref 1.7–2.4)

## 2020-08-24 MED ORDER — POTASSIUM CHLORIDE 20 MEQ PO PACK
40.0000 meq | PACK | Freq: Every day | ORAL | Status: DC
  Administered 2020-08-25 – 2020-08-26 (×2): 40 meq
  Filled 2020-08-24 (×2): qty 2

## 2020-08-24 MED ORDER — POTASSIUM CHLORIDE 20 MEQ PO PACK: 40.0000 meq | PACK | Freq: Every day | ORAL | Status: DC

## 2020-08-24 MED ORDER — POTASSIUM CHLORIDE 20 MEQ PO PACK: 20.0000 meq | PACK | Freq: Once | ORAL | Status: DC

## 2020-08-24 MED ORDER — POTASSIUM CHLORIDE 20 MEQ PO PACK
20.0000 meq | PACK | Freq: Once | ORAL | Status: AC
  Administered 2020-08-24: 20 meq
  Filled 2020-08-24: qty 1

## 2020-08-24 MED ORDER — METHYLPHENIDATE HCL 5 MG PO TABS: 5.0000 mg | ORAL_TABLET | Freq: Every day | ORAL | Status: DC

## 2020-08-24 MED ORDER — AMANTADINE HCL 50 MG/5ML PO SOLN
100.0000 mg | Freq: Every day | ORAL | Status: DC
  Administered 2020-08-25: 100 mg
  Filled 2020-08-24: qty 10

## 2020-08-24 MED ORDER — METHYLPHENIDATE HCL 5 MG PO TABS
5.0000 mg | ORAL_TABLET | Freq: Every day | ORAL | Status: DC
  Administered 2020-08-25: 5 mg via ORAL
  Filled 2020-08-24: qty 1

## 2020-08-24 NOTE — Progress Notes (Signed)
PROGRESS NOTE   Subjective/Complaints: Wife says she saw improvement with Ritalin but not Amantadine. Did not want Ritalin to be discontinued- restarted 5mg  daily.  Discussed putting pictures of family along bed.   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 EXAM: CT HEAD WITHOUT CONTRAST TECHNIQUE: Contiguous axial images were obtained from the base of the skull through the vertex without intravenous contrast. COMPARISON:  08/17/2020 FINDINGS: Brain: There is continued evolution of the right MCA territory infarct seen previously. Decreased cortical edema and sulcal effacement. Continued leftward midline shift measuring approximately 7 mm at the level of the septum pellucidum. Chronic small vessel ischemic changes are seen throughout the periventricular white matter. Lateral ventricles and midline structures are stable. The small residual right-sided subdural hematoma deep to the craniotomy site is unchanged, measuring less than 5 mm. Vascular: No hyperdense vessel or unexpected calcification. Skull: Stable postsurgical changes from right-sided craniotomy. No acute bony abnormality. Sinuses/Orbits: Small gas fluid level right maxillary sinus. Remaining sinuses are clear. Other: None. IMPRESSION: 1. Continued evolution of the right MCA territory infarct, with decreasing sulcal effacement but persistent leftward midline shift. 2. Stable small residual right hemispheric subdural hematoma, measuring less than 5 mm in thickness. Electronically Signed   By: Randa Ngo M.D.   On: 08/22/2020 21:22   Recent Labs    08/24/20 0340  WBC 9.1  HGB 11.8*  HCT 37.3*  PLT 428*    Recent Labs    08/24/20 0340  NA 138  K 3.9  CL 104  CO2 25  GLUCOSE 147*  BUN 20  CREATININE  0.85  CALCIUM 9.0    Intake/Output Summary (Last 24 hours) at 08/24/2020 1247 Last data filed at 08/24/2020 0443 Gross per 24 hour  Intake 0 ml  Output 850 ml  Net -850 ml        Physical Exam: Vital Signs Blood pressure 121/74, pulse 78, temperature (!) 97 F (36.1 C), temperature source Oral, resp. rate (!) 22, height 5\' 7"  (1.702 m), weight 86 kg, SpO2 100 %. Gen: no distress, normal appearing HEENT: oral mucosa pink and moist, NCAT Cardio: Reg rate, tachpneic Chest: normal effort, normal rate of breathing Abd: soft, non-distended Ext: no edema Psych: pleasant, normal affect   Musculoskeletal:    Cervical back: no axial or limb pain     Skin:    Comments: Some ecchymoses on arms B/L - no backside breakdown,scalp incision closed Neurological: alert, restless, follows simple commands for MMT today  Right gaze preference.. Moves right side spontaneously, remains mostly non-verbal with intermittent grunts or short phrases.  Unchanged     Assessment/Plan: 1. Functional deficits which require 3+ hours per day of interdisciplinary therapy in a comprehensive inpatient rehab setting. Physiatrist is providing close team supervision and 24 hour management of active medical problems listed below. Physiatrist and rehab team continue to assess barriers to discharge/monitor patient progress toward functional and medical goals  Care Tool:  Bathing    Body parts bathed by patient: Chest, Abdomen, Front  perineal area, Face   Body parts bathed by helper: Right arm, Left arm, Right lower leg, Left lower leg, Buttocks, Right upper leg, Left upper leg     Bathing assist Assist Level: Maximal Assistance - Patient 24 - 49%     Upper Body Dressing/Undressing Upper body dressing   What is the patient wearing?: Pull over shirt    Upper body assist Assist Level: Maximal Assistance - Patient 25 - 49%    Lower Body Dressing/Undressing Lower body dressing      What is the patient  wearing?: Pants, Incontinence brief     Lower body assist Assist for lower body dressing: Total Assistance - Patient < 25%     Toileting Toileting    Toileting assist Assist for toileting: Dependent - Patient 0%     Transfers Chair/bed transfer  Transfers assist     Chair/bed transfer assist level: Moderate Assistance - Patient 50 - 74%     Locomotion Ambulation   Ambulation assist   Ambulation activity did not occur: Safety/medical concerns  Assist level: Moderate Assistance - Patient 50 - 74% Assistive device: Walker-rolling Max distance: 100 ft   Walk 10 feet activity   Assist  Walk 10 feet activity did not occur: Safety/medical concerns  Assist level: Moderate Assistance - Patient - 50 - 74% Assistive device: Walker-rolling   Walk 50 feet activity   Assist Walk 50 feet with 2 turns activity did not occur: Safety/medical concerns  Assist level: Moderate Assistance - Patient - 50 - 74% Assistive device: Walker-rolling    Walk 150 feet activity   Assist Walk 150 feet activity did not occur: Safety/medical concerns         Walk 10 feet on uneven surface  activity   Assist Walk 10 feet on uneven surfaces activity did not occur: Safety/medical concerns         Wheelchair     Assist     Wheelchair activity did not occur: Safety/medical concerns         Wheelchair 50 feet with 2 turns activity    Assist    Wheelchair 50 feet with 2 turns activity did not occur: Safety/medical concerns       Wheelchair 150 feet activity     Assist  Wheelchair 150 feet activity did not occur: Safety/medical concerns       Blood pressure 121/74, pulse 78, temperature (!) 97 F (36.1 C), temperature source Oral, resp. rate (!) 22, height 5\' 7"  (1.702 m), weight 86 kg, SpO2 100 %.  Medical Problem List and Plan: 1.  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  Impaired mobility -DVT/anticoagulation:  Continue SQ Lovenox             -antiplatelet therapy: ASA 325 mg/day 3. Pain Management: tylenol prn 4. Mood: LCSW to follow for evaluation and support.              -antipsychotic agents: N/A 5. Neuropsych: This patient is not capable of making decisions  on his own behalf. 6. Skin/Wound Care: Routine pressure relief measures.  7. Fluids/Electrolytes/Nutrition: NPO with tube feeds.             -reduced water flushes to 100 cc every 4 hours-- -Na stable 6/20, repeat tomorrow.  8. HTN: Monitor BP tid with SBP goal <140/90.             --continue losartan/day, cardura/day and Metoprolol every 6 hours  - Vitals:   08/23/20 1913 08/24/20 0408  BP: 124/74 121/74  Pulse: 69 78  Resp: 17 (!) 22  Temp: (!) 97.5 F (36.4 C) (!) 97 F (36.1 C)  SpO2:  100%   Controlled 6/19 9. T2DM: Hgb A1C- 6.2. well controlled 6/18 CBG (last 3)  Recent Labs    08/24/20 0400 08/24/20 0810 08/24/20 1210  GLUCAP 138* 145* 160*    10. Lethargy: seems to wax and wane a bit which is expected given his CT scan  -restart ritalin 5mg , decrease amantadine to 100mg  11. PAF: Monitor HR tid--question date DOAC resumption hold off until repeat CT scan on Monday reviewed by neuro --SDH post fall 07/27/20             --continue Tikosyn bid             --appreciate pharmacy help to keep K+>4.0/Mg levels  . 12. Thoracic aortic aneurysm: Followed by [facility]. 13. Induced hypernatremia: Improving with increase water flushes  6/16 138---f/u Monday 14. Hypokalemia: replete 6/20 and repeat K+ tomorrow.   15. Urinary incontinence-  UC+ start  Keflex, f/u sensitivities.  16. Abdominal distention:  -improved with downward adjustment of TF/flushes  -moving bowels, actually have been loose--holding softeners, laxs    Spoke with wife reviewed chart   17.  Insomnia trial melatonin  LOS: 9 days A FACE TO FACE EVALUATION WAS PERFORMED  Clide Deutscher Mikaya Bunner 08/24/2020, 12:47 PM

## 2020-08-24 NOTE — Progress Notes (Signed)
Physical Therapy Progress Note  Patient Details  Name: [name] [name] MRN: [id number] Date of Birth: April 10, 1940  Today's Date: 08/24/2020 PT Individual Time: 1106-1203, 1400-1500, and 1530-1545 PT Individual Time Calculation (min): 57 min, 60 min, and 15 min  PT Short Term Goals Week 2:  PT Short Term Goal 1 (Week 2): Patient will perform sitting balance with mod A consistently. PT Short Term Goal 2 (Week 2): Patient will tolerate safely sitting OOB >30 min 3/7 days this week. PT Short Term Goal 3 (Week 2): Patient will perform basic transfers with mod A and mod cues consistently. PT Short Term Goal 4 (Week 2): Patient will ambulate >100 ft with mod A using LRAD consistently.  Skilled Therapeutic Interventions/Progress Updates:     Session 1: Patient in bed with is wife at bed side upon PT arrival. Patient alert for >90% of session. Patient did not display any signs of pain throughout session, patient did not provide response when asked due to cognitive deficits.   Patient externally>internally distracted throughout session. Required max cues to attend to multimodal cues throughout session, sustained attention lasting 0-5 sec in sitting and <2 min with ambulation today x2. Required max cues for initiation with latency of 20-30 during session.   Therapeutic Activity: Bed Mobility: Patient performed supine to sit with mod A for lower extremity management and trunk control due to posterior lean. Patient required facilitation to initiate rolling then required max cues for bringing his legs off the bed and min cues to push through his arms to come to sitting. Patient performed sit to supine with min A-CGA, required >1 min to attend to task to initiate lying without facilitation. Patient able to bring his legs onto the bed without assist with 4 cues to "lie down" with PT patting the pillow to make an audible noise to bring his attention to the pillow. Able to follow cues to walk legs to the L for  improved positioning in the bed x2/3 then required max A to complete positioning in the middle of the bed.  Transfers: Patient performed sit to/from stand x4 with min A and CGA x1 using RW, required max verbal cues and hand-over-hand assist to place L hand on RW. He performed scooting back on the bed x2 with min A due to landing close to EOB x1. He then performed stand pivot with R HHA bed<>TIS w/c with mod A to the R, with facilitation to promote forward weight shift and pivot. Provided mod multimodal cues for sequencing. Attempted to have patient wash his face with poor attention to task x3 trials and patient unsuccessful at following verbal and visual cues and did not continue following hand-over-hand assist. Patient did brush his hair with a hair brush automatically x2 sitting in front of a mirror. Attempted to work on verbalization of identifying familiar objects and family photos, patient did not verbalize due to increased external distraction.  Neuromuscular Re-ed: Patient performed the following activities: -Patient performed sitting balance 2x3-4 min sitting EOB. First trial, progressed from mod-min A with focus on midline orientation with mod cues to move his R shoulder toward a visual target on the R to correct L lean and reaching to grab therapist's knee to promote forward flexion to reduce posterior bias. Patient would follow cues, but was unable to sustain his attention on the task, very distracted with urinary catheter, able to redirect with a wash cloth x2 for <10 sec. On second trial, patient performed sitting balance with CGA with intermittent supervision  focused on visual scanning from R to L. Patient continued to be externally distracted by lines and leads with poor attention to therapist's cues. -Patient ambulated 10-15 feet x3 using RW with mod-min A with +2 for w/c follow and his wife standing in front to provide visual stimulation and verbal encouragement to keep patient engaged and  attentive during activity. Ambulated with reciprocal gait pattern, mild L lean, decreased gait speed, step height and step length, increased L knee flexion, reduced L stance time, and forward trunk flexion. Provided mod-max verbal cues for erect posture by looking at his wife, manual facilitation for weight shift L>R, and mod-max a for advancing RW to promote forward propulsion during gait.  Manual Therapy: -cervical rotation PROM with gentle over pressure and upper trap stretch with facilitation at the opposite acromion 2x1 min R/L with improved ROM from ~20 deg to ~45 deg on each side  Patient in bed with his wife and son at bedside at end of session with breaks locked, B mitts donned, bed alarm set, and all needs within reach.   Session 2&3:  Patient in bed asleep with his wife at bedside upon PT arrival. Patient very slow to arouse to verbal and tactile cues. Assessed patient's tone in all extremities at rest, some improvement of hypertonicity or L UE>LE, 2 and 1+ on Modified Ashworth Scale respectively. Patient responsive to painful stimulus in all extremities with flexor withdraw, and negative for clonus bilaterally. Patient began to arouse during assessment and initiated sitting up in the bed x2.   Therapeutic Activity: Bed Mobility: Patient performed supine to/from sit with max A x1 with patient reaching out with his R hand for assist after bringing his legs off the bed on his own. Patient unable to come to sitting with strong posterior bias using hand hold technique and patient was returned to lying. Then cued patient to roll on his R side to push up to sitting with min/mod A and min cues with reduced latency for initiation than in previous session.  Patient's bed noted to be soiled with urine, on assessment the condom catheter lost its seal and was leaking, RN made aware.  Transfers: Patient performed sit to/from stand from the bed x1 and BSC x3 with min/mod A +2 using RW with second attempt  from [facility] requiring max A +2 due to increased R extensor tone causing his R foot to slide forward. Provided mod-max verbal cues for initiation and hand-over-hand assist for L hand placement. Patient took steps forward to have BSC and TIS w/c brought up behind hip rather than pivoting during transfers due to decreased motor planning with L turns. Patient was incontinent of bladder and bowl. Requited total A for peri-care and lower body clothing management. Patient initiated taking his shirt off while sitting on BSC, facilitated completion of task with mod A due to apraxia L>R. Provided max cues and assist for sequencing for donning a new t-shirt. Patient initiated washing his R leg and between his legs, but unable to initiate washing his R leg despite max verbal, tactile, and visual cues.   Patient perseverative of adjusting his socks, crossing his legs, and rubbing his nose throughout session. Cleaned patient's nose with total A at end of session due to increased build-up.   Patient in [place] w/c with gait belt around hips and chair preventing hip extension in sitting with handed off to SLP at end of session with breaks locked.   Returned after 30 min SLP session to return patient  to bed. Performed stand/squat pivot TIS w/c>bed with mod A and mod cues for initiation and pivot. He performed sit to supine with min A for trunk and lower extremity management with 2 cues to "lie down." Patient in bed with B mitts donned, sitting up in the bed HOB >30 deg, with tube feed running, 4 rails up for safety, bed alarm set, and all needs in reach.    Tube feed on hold or disconnected by RN for mobility during all sessions.  Therapy Documentation Precautions:  Precautions Precautions: Fall Precaution Comments: cortrak Restrictions Weight Bearing Restrictions: No    Therapy/Group: Individual Therapy  Carren Blakley L Kieron Kantner PT, DPT  08/24/2020, 12:26 PM

## 2020-08-24 NOTE — Plan of Care (Signed)
  Problem: RH Swallowing Goal: LTG Patient will consume least restrictive diet using compensatory strategies with assistance (SLP) Description: LTG:  Patient will consume least restrictive diet using compensatory strategies with assistance (SLP) Flowsheets (Taken 08/24/2020 1630) LTG: Pt Patient will consume least restrictive diet using compensatory strategies with assistance of (SLP): (Fluctuating levels of alertness and arousal impacting functional participation) Moderate Assistance - Patient 50 - 74% Note: Fluctuating levels of alertness and arousal impacting functional participation Goal: LTG Patient will participate in dysphagia therapy to increase swallow function with assistance (SLP) Description: LTG:  Patient will participate in dysphagia therapy to increase swallow function with assistance (SLP) Flowsheets (Taken 08/24/2020 1633) LTG: Pt will participate in dysphagia therapy to increase swallow function with assistance of (SLP): (Fluctuating levels of alertness and arousal impacting functional participation) Moderate Assistance - Patient 50 - 74% Note: Fluctuating levels of alertness and arousal impacting functional participation   Problem: RH Expression Communication Goal: LTG Patient will express needs/wants via multi-modal(SLP) Description: LTG:  Patient will express needs/wants via multi-modal communication (gestures/written, etc) with cues (SLP) 08/24/2020 1633 by Charna Elizabeth T, [place] (Taken 08/24/2020 1633) LTG: Patient will express needs/wants via multimodal communication (gestures/written, etc) with cueing (SLP): (Fluctuating levels of alertness and arousal impacting functional participation) Moderate Assistance - Patient 50 - 74% Note: Fluctuating levels of alertness and arousal impacting functional participation 08/24/2020 1633 by Charna Elizabeth T, CCC-SLP Flowsheets (Taken 08/24/2020 1633) LTG: Patient will express needs/wants via multimodal communication  (gestures/written, etc) with cueing (SLP): (Fluctuating levels of alertness and arousal impacting functional participation) Moderate Assistance - Patient 50 - 74% Goal: LTG Patient will verbally express basic/complex needs(SLP) Description: LTG:  Patient will verbally express basic/complex needs, wants or ideas with cues  (SLP) Flowsheets (Taken 08/24/2020 1633) LTG: Patient will verbally express basic/complex needs, wants or ideas (SLP): (Fluctuating levels of alertness and arousal impacting functional participation) Moderate Assistance - Patient 50 - 74% Note: Fluctuating levels of alertness and arousal impacting functional participation   Problem: RH Attention Goal: LTG Patient will demonstrate this level of attention during functional activites (SLP) Description: LTG:  Patient will will demonstrate this level of attention during functional activites (SLP) Flowsheets (Taken 08/24/2020 1633) LTG: Patient will demonstrate this level of attention during cognitive/linguistic activities with assistance of (SLP): (Fluctuating levels of alertness and arousal impacting functional participation) Moderate Assistance - Patient 50 - 74% Note: Fluctuating levels of alertness and arousal impacting functional participation

## 2020-08-24 NOTE — Progress Notes (Signed)
Occupational Therapy Weekly Progress Note  Patient Details  Name: [name] [name] MRN: [id number] Date of Birth: 11-13-1940  Beginning of progress report period: August 16, 2020 End of progress report period: August 24, 2020  Today's Date: 08/24/2020 OT Individual Time: 4503-8882 OT Individual Time Calculation (min): 60 min    Patient has met 1 of 3 short term goals.  Pt had fluctuating arousal and alertness this week, impacted by a UTI and medication management. Despite this pt continues to make progress with initiation and following commands during basic ADL tasks.   Patient continues to demonstrate the following deficits: muscle weakness, impaired timing and sequencing, abnormal tone, unbalanced muscle activation, ataxia, decreased coordination, and decreased motor planning, decreased midline orientation and decreased attention to left, decreased initiation, decreased attention, decreased awareness, decreased problem solving, decreased safety awareness, decreased memory, and delayed processing, and decreased sitting balance, decreased standing balance, decreased postural control, hemiplegia, and decreased balance strategies and therefore will continue to benefit from skilled OT intervention to enhance overall performance with BADL and Reduce care partner burden.  Patient not progressing toward long term goals.  Continue plan of care at this time, will likely need to downgrade goals to mod-max A d/t fluctuating arousal and initiation.   OT Short Term Goals Week 1:  OT Short Term Goal 1 (Week 1): Pt will attend to ADL task for 1 minute with mod cueing OT Short Term Goal 1 - Progress (Week 1): Progressing toward goal OT Short Term Goal 2 (Week 1): Pt will don shirt with max A OT Short Term Goal 2 - Progress (Week 1): Met OT Short Term Goal 3 (Week 1): Pt will use BUE to bathe UB with mod A OT Short Term Goal 3 - Progress (Week 1): Progressing toward goal Week 2:  OT Short Term Goal 1 (Week 2): Pt  will bathe UB with mod A OT Short Term Goal 2 (Week 2): Pt will initiate 50% of commands during bathing with mod cueing OT Short Term Goal 3 (Week 2): Pt will complete sit > stand with max A +1  Skilled Therapeutic Interventions/Progress Updates:    Pt received supine, restless d/t incontinent very loose BM. He had soiled his mitts with the BM and they were thrown away and RN alerted to need for new ones. Pt required total A for peri hygiene supine. Max +2 assist to transfer from supine to sitting EOB. Heavy posterior lean and bias throughout session today, requiring heavy assist to counter as pt was unable to follow cueing to fix himself. Max A +2 stand pivot transfer to the TIS w/c using the RW. Pt completed UB bathing at the sink with min A- great initiation tasks despite eyes being closed intermittently. Pt required max A to don shirt. Pt completed sit > stand at the sink for total A LB dressing. Min A for oral care with suction toothbrush. Pt was taken to the therapy gym for LUE PROM to all joints, discussed resting hand splint and current recommendation against d/t full AROM and frequent moving of this hand- Occasional tone with reflexive movements but not at rest. Pt returned to his room and with +2 assist he used stedy to transfer back to bed. Pt left supine with all needs met, bed alarm set. Cortrak running.   Therapy Documentation Precautions:  Precautions Precautions: Fall Precaution Comments: cortrak Restrictions Weight Bearing Restrictions: No   Therapy/Group: Individual Therapy  Curtis Sites 08/24/2020, 6:12 AM

## 2020-08-24 NOTE — Progress Notes (Signed)
Speech Language Pathology Weekly Progress and Session Note  Patient Details  Name: [name] [name] MRN: [id number] Date of Birth: 1940/05/17  Beginning of progress report period: August 17, 2020 End of progress report period: August 24, 2020  Today's Date: 08/24/2020 SLP Individual Time: 1500-1540 SLP Individual Time Calculation (min): 40 min  Short Term Goals: Week 1: SLP Short Term Goal 1 (Week 1): Pt will consume therapeutic trials of ice chips and/or small amounts of water with minimal overt s/s of aspiration and max assist for use of swallowing precautions. SLP Short Term Goal 1 - Progress (Week 1): Met SLP Short Term Goal 2 (Week 1): Pt will focus his attention to a targeted stimulus in >50% of opportunities with max assist multimodal cues. SLP Short Term Goal 2 - Progress (Week 1): Not met SLP Short Term Goal 3 (Week 1): Pt will maintain alertness for periods of >5 minutes to participate in basic ADLs with max assist multimodal cues. SLP Short Term Goal 3 - Progress (Week 1): Not met SLP Short Term Goal 4 (Week 1): Pt will use multimodal means of communication (gestures, head nod/shake, pointing, verbalizing, vocalizing) to convey his immediate needs and wants to caregivers with max assist multimodal cues. SLP Short Term Goal 4 - Progress (Week 1): Not met   New Short Term Goals: Week 2: SLP Short Term Goal 1 (Week 2): Pt will consume therapeutic trials of ice chips and/or small amounts of water with minimal overt s/s of aspiration and mod assist for use of swallowing precautions. SLP Short Term Goal 2 (Week 2): Pt will focus his attention to a targeted stimulus in >50% of opportunities with max assist multimodal cues. SLP Short Term Goal 3 (Week 2): Pt will maintain alertness for periods of >5 minutes to participate in basic ADLs with max assist multimodal cues. SLP Short Term Goal 4 (Week 2): Pt will use multimodal means of communication (gestures, head nod/shake, pointing, verbalizing,  vocalizing) to convey his immediate needs and wants to caregivers with max assist multimodal cues.  Weekly Progress Updates: Patient has met 1 of 3 short term goals. Pt had fluctuating arousal and alertness this week, impacted by a UTI. Despite this pt continues to make progress with spontaneous communication and tolerance of ice chip trials.  Patient continues to exhibit severely limited focused attention with difficulty attending to tasks for >3 minute intervals; at times difficult to keep his eyes open for sustained periods of time. His communication abilities fluctuate with levels of alertness and arousal. Overall demonstrates inconsistent ability to communicate his needs. He is most often seen utilizing subtle head nods as most effective form of communication to yes/no questions and occasional verbal responses. Improvement has been noted in swallow initiation during therapeutic trials of ice chips. Patient is still not functionally to the point where it would be appropriate/safe to consider free water protocol, nor consider MBSS. Continue ST treatment to enhance overall performance from a cognitive-communication, and swallowing standpoint.    Patient not progressing toward long term goals. Goals were modified to reflect moderate level of assistance due fluctuating arousal.  Intensity: Minumum of 1-2 x/day, 30 to 90 minutes Frequency: 3 to 5 out of 7 days Duration/Length of Stay: 21-28 days Treatment/Interventions: Cognitive remediation/compensation;Cueing hierarchy;Dysphagia/aspiration precaution training;Internal/external aids;Speech/Language facilitation;Environmental controls;Multimodal communication approach;Patient/family education;Therapeutic Activities  Daily Session  Skilled Therapeutic Interventions:   Session 1 (9735-3299): Skilled ST treatment performed with focus on cognitive and swallowing goals. Patient was sleeping with wife at bedside. Pt aroused, although  not easily and was  re-positioned to perform oral care and therapeutic PO trials. Patient required max assist for oral care with suction toothbrush. Patient consumed 3 ice chips (one at a time) with oral holding of bolus resulting in delayed, wet cough during 2/3 trials. Able to initiate swallow with improved timing during final attempt. Trials were discontinued due to substantial distractibility, fidgeting, and eventually reduced alertness (only able to maintain eyes open for <2 minute intervals.) Patient was left in bed with alarm activated, and spouse at bedside with access to patient's needs.   Session 2 (1500-1540): Skilled ST treatment performed with focus on cognitive and swallowing goals. Patient was seen in wheelchair following PT session. He was awake and able to focus attention to therapeutic PO trials with ice chips for ~2.5 minute intervals with max verbal, visual, and tactile cueing. Maintained arousal for duration of session. Consumed 8 ice chip trials (one ice chip at a time). Bolus holding with no spontaneous swallow, and weak cough noted during 2 occasions. Patient exhibited improved bolus awareness and reflexive swallow during the other 6 trials with no overt s/sx of aspiration. Patient was able to self-feed bolus with hand-over-hand assist and mod-to-max verbal cues. Patient demonstrated spontaneous responses to therapist including greetings and polite responses such as "thank you". Patient was transferred to bed and left with PT for additional adjustments. Continue with current plan of care.  General    Pain Pain Assessment Pain Scale: 0-10 Pain Score: 0-No pain  Therapy/Group: Individual Therapy  Patty Sermons 08/24/2020, 4:44 PM

## 2020-08-24 NOTE — Progress Notes (Addendum)
Pharmacy: Dofetilide (Tikosyn) - Follow Up Assessment and Electrolyte Replacement  Pharmacy consulted to assist in monitoring and replacing electrolytes in this 80 y.o. male admitted on 08/15/2020 continuing dofetilide. Pharmacy consulted to keep potassium above 4 and magnesium above 2.   Labs:    Component Value Date/Time   K 3.9 08/24/2020 0340   K 4.1 11/26/2013 0000   MG 2.1 08/24/2020 0340     Plan: Potassium: K 3.9 while on 40 mEq daily. Noted patient received an average of 50 mEq daily 6/10-6/14. Will give an extra 20 mEq today and continue with Kcl 22meq daily  Magnesium:  Mg 2.1,  on scheduled magnesium gluconate 500mg  daily   F/u K and Mg on 6/27 Next available potassium dose per tube is 20mEq  Thank you for allowing pharmacy to participate in this patient's care.  [name], PharmD PGY1 Pharmacy Resident 08/24/2020 12:14 PM   Please check AMION for all [place] phone numbers After 10:00 PM, call [place] [phone]

## 2020-08-25 ENCOUNTER — Other Ambulatory Visit (HOSPITAL_COMMUNITY): Payer: Self-pay

## 2020-08-25 LAB — BASIC METABOLIC PANEL
Anion gap: 9 (ref 5–15)
BUN: 19 mg/dL (ref 8–23)
CO2: 25 mmol/L (ref 22–32)
Calcium: 9 mg/dL (ref 8.9–10.3)
Chloride: 104 mmol/L (ref 98–111)
Creatinine, Ser: 0.84 mg/dL (ref 0.61–1.24)
GFR, Estimated: >60 mL/min (ref >60)
Glucose, Bld: 147 mg/dL — ABNORMAL HIGH (ref 70–99)
Potassium: 3.8 mmol/L (ref 3.5–5.1)
Sodium: 138 mmol/L (ref 135–145)

## 2020-08-25 LAB — GLUCOSE, CAPILLARY
Glucose-Capillary: 118 mg/dL — ABNORMAL HIGH (ref 70–99)
Glucose-Capillary: 149 mg/dL — ABNORMAL HIGH (ref 70–99)
Glucose-Capillary: 152 mg/dL — ABNORMAL HIGH (ref 70–99)
Glucose-Capillary: 100 mg/dL — ABNORMAL HIGH (ref 70–99)
Glucose-Capillary: 159 mg/dL — ABNORMAL HIGH (ref 70–99)

## 2020-08-25 LAB — VITAMIN D 25 HYDROXY (VIT D DEFICIENCY, FRACTURES): Vit D, 25-Hydroxy: 41.17 ng/mL (ref 30–100)

## 2020-08-25 LAB — URINE CULTURE: Culture: >=100000 — AB

## 2020-08-25 MED ORDER — APIXABAN 5 MG PO TABS: 5.0000 mg | ORAL_TABLET | Freq: Two times a day (BID) | ORAL | Status: DC

## 2020-08-25 MED ORDER — NYSTATIN 100000 UNIT/ML MT SUSP
5.0000 mL | Freq: Four times a day (QID) | OROMUCOSAL | Status: AC
  Administered 2020-08-25 – 2020-09-03 (×34): 500000 [IU] via ORAL
  Filled 2020-08-25 (×39): qty 5

## 2020-08-25 MED ORDER — JEVITY 1.5 CAL/FIBER PO LIQD
1000.0000 mL | ORAL | Status: DC
  Administered 2020-08-25 – 2020-09-09 (×13): 1000 mL
  Filled 2020-08-25 (×25): qty 1000

## 2020-08-25 MED ORDER — AMANTADINE HCL 50 MG/5ML PO SOLN
100.0000 mg | Freq: Two times a day (BID) | ORAL | Status: DC
  Administered 2020-08-25 – 2020-09-15 (×40): 100 mg
  Filled 2020-08-25 (×44): qty 10

## 2020-08-25 MED ORDER — METHYLPHENIDATE HCL 5 MG PO TABS
5.0000 mg | ORAL_TABLET | Freq: Every day | ORAL | Status: DC
  Administered 2020-08-26 – 2020-08-28 (×3): 5 mg
  Filled 2020-08-25 (×3): qty 1

## 2020-08-25 MED ORDER — APIXABAN 5 MG PO TABS
5.0000 mg | ORAL_TABLET | Freq: Two times a day (BID) | ORAL | Status: DC
  Administered 2020-08-25 – 2020-09-15 (×42): 5 mg
  Filled 2020-08-25 (×42): qty 1

## 2020-08-25 MED ORDER — HYDROCORTISONE 1 % EX CREA
TOPICAL_CREAM | Freq: Two times a day (BID) | CUTANEOUS | Status: DC | PRN
  Filled 2020-08-25: qty 28

## 2020-08-25 NOTE — Progress Notes (Signed)
Patient ID: [name] [name], male   DOB: 03-Aug-1940, 80 y.o.   MRN: [id number]  SW met with pt and pt wife Angus while pt was in therapy. SW informed on d/c date 7/11. SW briefly reviewed options for ILF, or transitioning to the medical (SNF) of [facility]. To be determined what level of care will be needed. SW to follow-up next week after team conference.   SW left message for Donna/SW with [facility] [place] [phone]) to inform on above with d/c date and possible SNF pending pt care needs. SW encouraged follow-up if need, otherwise SW will f/u if SNF is required.   Loralee Pacas, MSW, [place] Office: [phone] Cell: [phone] Fax: [phone]

## 2020-08-25 NOTE — Progress Notes (Signed)
Speech Language Pathology Daily Session Note  Patient Details  Name: [name] [name] MRN: [id number] Date of Birth: 1940/05/26  Today's Date: 08/25/2020 SLP Individual Time: 0930-1000 SLP Individual Time Calculation (min): 30 min  Short Term Goals: Week 2: SLP Short Term Goal 1 (Week 2): Pt will consume therapeutic trials of ice chips and/or small amounts of water with minimal overt s/s of aspiration and mod assist for use of swallowing precautions. SLP Short Term Goal 2 (Week 2): Pt will focus his attention to a targeted stimulus in >50% of opportunities with max assist multimodal cues. SLP Short Term Goal 3 (Week 2): Pt will maintain alertness for periods of >5 minutes to participate in basic ADLs with max assist multimodal cues. SLP Short Term Goal 4 (Week 2): Pt will use multimodal means of communication (gestures, head nod/shake, pointing, verbalizing, vocalizing) to convey his immediate needs and wants to caregivers with max assist multimodal cues.  Skilled Therapeutic Interventions: Skilled ST treatment performed with focus on swallowing and cognitive goals. Patient was awake in bed with spouse at bedside. Patient maintained arousal for duration of session with the exception of the last 5 minutes where he exhibited difficulty keeping his eyes open. Patient responded to simple Y/N and biographical questions through verbal response or head nodding with mod A verbal cues. Verbalizations can be described as whispers, and aphonic at times. Patient displayed increased gestures and waved goodbye to therapist upon exit from room. SLP assisted patient with oral care using suction toothbrush and oral sponge using hand-over-hand and max assist. Ivory colored coating noted on lingual surface suspicious for thrush. MD and team notified during conference. Patient consumed 10 ice chips. Hand-over-hand and min A for self feeding. Exhibited mildly delayed swallow response (2-3 second delay) during 8/10 trials. No  spontaneous swallow response noted during 2/10 trials resulting in immediate cough x1 likely due to premature spillage, and delayed cough x1. No other overt s/sx of aspiration noted. Internal distractibility and constant fidgeting did not appear to impact bolus awareness as significantly as compared to previous encounters. Patient was left in bed with alarm activated and needs within reach. Continue with current POC.     Pain Pain Assessment Pain Scale: 0-10 Pain Score: 0-No pain Faces Pain Scale: No hurt  Therapy/Group: Individual Therapy  Patty Sermons 08/25/2020, 10:29 AM

## 2020-08-25 NOTE — Patient Care Conference (Signed)
Inpatient RehabilitationTeam Conference and Plan of Care Update Date: 08/25/2020   Time: 11:05 AM    Patient Name: [name] [name]      Medical Record Number: [id number]  Date of Birth: [birth year] Sex: Male         Room/Bed: 4M11C/4M11C-01 Payor Info: Payor: MEDICARE / Plan: MEDICARE PART A AND B / Product Type: *No Product type* /    Admit Date/Time:  08/15/2020  2:18 PM  Primary Diagnosis:  Acute ischemic right MCA stroke [facility])  Hospital Problems: Principal Problem:   Acute ischemic right MCA stroke 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 Leeroy Cha Care Coodinator Present: Loralee Pacas, LCSWA;Karle Desrosier Creig Hines, RN, BSN, CRRN Nurse Present: [name], RN PT Present: Apolinar Junes, PT OT Present: Laverle Hobby, OT SLP Present: Other (comment) Charna Elizabeth, SLP) [place] Coordinator present : Gunnar Fusi, SLP     Current Status/Progress Goal Weekly Team Focus  Bowel/Bladder   incintinent bowel and bladder. condom cath  beome cintinent  assess q shift   Swallow/Nutrition/ Hydration             ADL's   Improvement in following commands, still limited by severe inattention/attention/arousal, improvement in LUE use functionally  min A overall  ADLs, cognitive retraining, transfers, initiation, attention to task, family edu   Mobility   mod-min A overall with intermittent max A and/or +2 assist with attention and motor planning deficits with mobility, gait 100 ft with RW  Min A overall  Attention, initiation, functional mobility, motor plannig, activity tolerance, balance, midline orientation, L attention, gait training, patient/caregiver education   Communication             Safety/Cognition/ Behavioral  Observations  Max  Mod A  Arousal, focused attention, initiation, ice chips with speech therapy.   Pain   no pain  pain level stay under 3/10  assess q shift and medicate   Skin   surgical incision and blister on left buttocsk  skin remain intact  assess every shift     Discharge Planning:  Patient and spouse plan to return to ILF pending patient progress. Additional assistance is avaliable.   Team Discussion: CT of head is stable, Ritalin 5 mg started again today, giving 40 mEq K+. Incontinent B/B, no pain reported, has rash to the back of neck. Can go to skilled at [facility] is necessary. Patient on target to meet rehab goals: Improvement in following commands, alert but severe inattention. Max/total overall, min assist ADL's. Able to walk 25 ft min assist when alert. Not going to decrease goals. Min/mod overall, walked up to 200 ft with RW with min assist. Verbalizes yes/no and thank you, automatic swallow with ice chips. Has coating on tongue, suspect for thrush.    *See Care Plan and progress notes for long and short-term goals.   Revisions to Treatment Plan:  MD adjusting medications.  Teaching Needs: Family education, medication management, transfer training, gait training, balance training, endurance training, safety awareness.  Current Barriers to Discharge: Decreased caregiver support, Medical stability, Home enviroment access/layout, Incontinence, Lack of/limited family support, Weight, Medication compliance, Behavior, and Nutritional means  Possible Resolutions to Barriers: Continue current medications, provide emotional support.     Medical Summary Current Status: improvement in following  commands, lethargic, impaired initiation, large right MCA stroke, hypokalemia  Barriers to Discharge: Medical stability  Barriers to Discharge Comments: lethargic, impaired initiation, large right MCA stroke, hypokalemia, dysphagia, thrush Possible Resolutions to [employer]  Focus: restart ritalin 5mg , continue amantading 200mg , repeat CT this week reviewed and discussed with wife, continue neurology consultation regarding restarting of anticoagulation, continue daily supplement, continue TF, start nystatin   Continued Need for Acute Rehabilitation Level of Care: The patient requires daily medical management by a physician with specialized training in physical medicine and rehabilitation for the following reasons: Direction of a multidisciplinary physical rehabilitation program to maximize functional independence : Yes Medical management of patient stability for increased activity during participation in an intensive rehabilitation regime.: Yes Analysis of laboratory values and/or radiology reports with any subsequent need for medication adjustment and/or medical intervention. : Yes   I attest that I was present, lead the team conference, and concur with the assessment and plan of the team.   Cristi Loron 08/25/2020, 2:52 PM

## 2020-08-25 NOTE — Progress Notes (Addendum)
[facility] for Apixaban Indication:  CVA, Atrial Fibrillation  Allergies  Allergen Reactions   Naproxen Hives   No [facility] Food Allergies Other (See Comments)    Scallops - distress, nausea and vomitting    Patient Measurements: Height: 5\' 7"  (170.2 cm) Weight: 86 kg (189 lb 9.5 oz) IBW/kg (Calculated) : 66.1  Vital Signs: Temp: 98.1 F (36.7 C) (06/21 1255) Temp Source: Oral (06/21 1255) BP: 104/83 (06/21 1255) Pulse Rate: 84 (06/21 1255)  Labs: Recent Labs    08/24/20 0340 08/25/20 0435  HGB 11.8*  --   HCT 37.3*  --   PLT 428*  --   CREATININE 0.85 0.84    Estimated Creatinine Clearance: 77.2 mL/min (by C-G formula based on SCr of 0.84 mg/dL).   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 Pt was formerly on warfarin for afib/aflutter. Pt has been receiving enoxaparin 40 mg daily at [facility] (last dose at 0852 AM today). Pharmacy has been consulted to dose  apixaban.  H/H 11.8/37.3, plt 428; Scr 0.84; wt 98 kg, 79 yrs of age  22/20 CT: no new hemorrhage  Goals of Therapy: Secondary prevention of stroke Monitor platelets by anticoagulation protocol: Yes   Plan:  Apixaban 5 mg PO BID, starting this evening Monitor CBC Monitor for bleeding  Gillermina Hu, PharmD, BCPS, [facility] Clinical Pharmacist 08/25/2020,3:15 PM

## 2020-08-25 NOTE — TOC Benefit Eligibility Note (Signed)
Patient Teacher, English as a foreign language completed.    The patient is currently admitted and upon discharge could be taking Eliquis 5 mg.  Non Formulary  The patient is currently admitted and upon discharge could be taking Xarelto 20 mg.  The current 30 day co-pay is, $280.37 due to $220.50 deductible remaining.  Co-pay should be $59.87 after this month.   The patient is insured through [place], [place] Patient Advocate Specialist [place] Team Direct Number: [phone]  Fax: [phone]

## 2020-08-25 NOTE — Progress Notes (Signed)
Occupational Therapy Session Note  Patient Details  Name: [name] [name] MRN: [id number] Date of Birth: [birth year]  Today's Date: 08/25/2020 OT Individual Time: 4128-7867 OT Individual Time Calculation (min): 69 min    Short Term Goals: Week 2:  OT Short Term Goal 1 (Week 2): Pt will bathe UB with mod A OT Short Term Goal 2 (Week 2): Pt will initiate 50% of commands during bathing with mod cueing OT Short Term Goal 3 (Week 2): Pt will complete sit > stand with max A +1  Skilled Therapeutic Interventions/Progress Updates:    Pt received supine, OT arriving with his wife. Pt restless- no sleep chart done over night but per nursing report pt did "Not sleep much". Pt very restless and attempting to get OOB. Pt not incontinent. Assisted pt in donning shorts at bed level - max A but pt attempting to participate. Pt had very few attempts to vocalize during session despite encouragement/cueing. Pt frequently scratching at nose and while not overtly trying to pull NG tube, getting hold of it several times while OT quickly moved his hands. Pt extremely internally/externally distracted- pulling at and grabbing anything within reach. He completed a stand pivot transfer to the TIS wc with mod A using the RW. He required max cueing for following commands and initiation, but once he began a motor plan he was successful in executing it. UB bathing with mod A, dressing with max A. Pt completed 25 ft x2 of functional mobility with min-mod A, +2 present and required for safety. Short, concise verbal cues were most successful in command following today, usually paired with a tactile cue. Pt was returned to bed and left supine with B mitts on, NG connected and running. Bed alarm on, wife Herbert Pun present.   Therapy Documentation Precautions:  Precautions Precautions: Fall Precaution Comments: cortrak Restrictions Weight Bearing Restrictions: No   Therapy/Group: Individual Therapy  Curtis Sites 08/25/2020, 6:24  AM

## 2020-08-25 NOTE — Progress Notes (Signed)
Speech Language Pathology Daily Session Note  Patient Details  Name: [name] [name] MRN: [id number] Date of Birth: 1941-02-02  Today's Date: 08/25/2020 SLP Individual Time: 1916-6060 SLP Individual Time Calculation (min): 30 min  Short Term Goals: Week 2: SLP Short Term Goal 1 (Week 2): Pt will consume therapeutic trials of ice chips and/or small amounts of water with minimal overt s/s of aspiration and mod assist for use of swallowing precautions. SLP Short Term Goal 2 (Week 2): Pt will focus his attention to a targeted stimulus in >50% of opportunities with max assist multimodal cues. SLP Short Term Goal 3 (Week 2): Pt will maintain alertness for periods of >5 minutes to participate in basic ADLs with max assist multimodal cues. SLP Short Term Goal 4 (Week 2): Pt will use multimodal means of communication (gestures, head nod/shake, pointing, verbalizing, vocalizing) to convey his immediate needs and wants to caregivers with max assist multimodal cues.  Skilled Therapeutic Interventions: Pt seen for skilled ST with focus on speech and swallowing goals. Pt able to maintain alertness across 30 minute tx session provided min cues. Oral care completed via suction swab prior to PO trials. SLP assisted patient with trials of single ice chip via spoon by providing hand over hand and max assist. Pt consuming 7 ice chips with 1 episode very delayed swallow 2' decreased attention to bolus and distractibility. Tactile stimulation eventually resulted in swallow. Pt with 1 episode immediate cough after ice chip trial. Pt voice initially aphonic during session, after ice chips and coughing episode voicing did increase, however remains severely breathy. Pt responding to yes/no biographical questions on 60% of occasions provided max A cues. Accuracy of responses ~40%. Spoke with wife about ST POC. Calming, meditative music played on computer in room and lights dimmed. Pt left in bed with alarm set and wife present.  Cont ST POC.   Pain Pain Assessment Pain Scale: 0-10 Pain Score: 0-No pain  Therapy/Group: Individual Therapy  Dewaine Conger 08/25/2020, 3:58 PM

## 2020-08-25 NOTE — Progress Notes (Signed)
PROGRESS NOTE   Subjective/Complaints: Team conference held today Plan for d/c July 11th Improved attention today with Ritalin.   ROS: Limited due to cognitive/behavioral, no verbal output with me today    Objective:   CT HEAD WO CONTRAST  Result Date: 08/24/2020 CLINICAL DATA:  Stroke, follow-up EXAM: CT HEAD WITHOUT CONTRAST TECHNIQUE: Contiguous axial images were obtained from the base of the skull through the vertex without intravenous contrast. COMPARISON:  None. FINDINGS: Brain: Evolving right MCA territory infarction is again identified with areas of gyriform petechial hemorrhage and/or laminar necrosis. Residual right cerebral convexity subdural collection post hematoma evacuation is unchanged. Mild mass effect due to both of these processes persists. There is no new hemorrhage. No new loss of gray-white differentiation. Chronic infarct along the posterior left cerebral convexity is again noted. No hydrocephalus. Vascular: There is atherosclerotic calcification at the skull base. Skull: Right craniotomy.  Otherwise unremarkable. Sinuses/Orbits: No acute finding. Other: None. IMPRESSION: Evolving right MCA territory infarction and residual right cerebral convexity subdural collection. Mild mass effect persists. No new hemorrhage. No new loss of gray-white differentiation. Electronically Signed   By: Macy Mis M.D.   On: 08/24/2020 18:20   Recent Labs    08/24/20 0340  WBC 9.1  HGB 11.8*  HCT 37.3*  PLT 428*    Recent Labs    08/24/20 0340 08/25/20 0435  NA 138 138  K 3.9 3.8  CL 104 104  CO2 25 25  GLUCOSE 147* 147*  BUN 20 19  CREATININE 0.85 0.84  CALCIUM 9.0 9.0    Intake/Output Summary (Last 24 hours) at 08/25/2020 1010 Last data filed at 08/25/2020 0434 Gross per 24 hour  Intake --  Output 600 ml  Net -600 ml        Physical Exam: Vital Signs Blood pressure 108/79, pulse 83, temperature 98.5 F  (36.9 C), temperature source Oral, resp. rate 18, height 5\' 7"  (1.702 m), weight 86 kg, SpO2 99 %. Gen: no distress, normal appearing HEENT: oral mucosa pink and moist, NCAT Cardio: Reg rate Chest: normal effort, normal rate of breathing Abd: soft, non-distended Ext: no edema Psych: pleasant, normal affect  Musculoskeletal:    Cervical back: no axial or limb pain     Skin:    Comments: Some ecchymoses on arms B/L - no backside breakdown,scalp incision closed Neurological: alert, restless, follows simple commands for MMT today  Right gaze preference.. Moves right side spontaneously, remains mostly non-verbal with intermittent grunts or short phrases.  Unchanged     Assessment/Plan: 1. Functional deficits which require 3+ hours per day of interdisciplinary therapy in a comprehensive inpatient rehab setting. Physiatrist is providing close team supervision and 24 hour management of active medical problems listed below. Physiatrist and rehab team continue to assess barriers to discharge/monitor patient progress toward functional and medical goals  Care Tool:  Bathing    Body parts bathed by patient: Chest, Abdomen, Front perineal area, Face   Body parts bathed by helper: Right arm, Left arm, Right lower leg, Left lower leg, Buttocks, Right upper leg, Left upper leg     Bathing assist Assist Level: Maximal Assistance - Patient 24 - 49%  Upper Body Dressing/Undressing Upper body dressing   What is the patient wearing?: Pull over shirt    Upper body assist Assist Level: Maximal Assistance - Patient 25 - 49%    Lower Body Dressing/Undressing Lower body dressing      What is the patient wearing?: Pants, Incontinence brief     Lower body assist Assist for lower body dressing: Total Assistance - Patient < 25%     Toileting Toileting    Toileting assist Assist for toileting: Dependent - Patient 0%     Transfers Chair/bed transfer  Transfers assist     Chair/bed  transfer assist level: Moderate Assistance - Patient 50 - 74%     Locomotion Ambulation   Ambulation assist   Ambulation activity did not occur: Safety/medical concerns  Assist level: Moderate Assistance - Patient 50 - 74% Assistive device: Walker-rolling Max distance: 100 ft   Walk 10 feet activity   Assist  Walk 10 feet activity did not occur: Safety/medical concerns  Assist level: Moderate Assistance - Patient - 50 - 74% Assistive device: Walker-rolling   Walk 50 feet activity   Assist Walk 50 feet with 2 turns activity did not occur: Safety/medical concerns  Assist level: Moderate Assistance - Patient - 50 - 74% Assistive device: Walker-rolling    Walk 150 feet activity   Assist Walk 150 feet activity did not occur: Safety/medical concerns         Walk 10 feet on uneven surface  activity   Assist Walk 10 feet on uneven surfaces activity did not occur: Safety/medical concerns         Wheelchair     Assist     Wheelchair activity did not occur: Safety/medical concerns         Wheelchair 50 feet with 2 turns activity    Assist    Wheelchair 50 feet with 2 turns activity did not occur: Safety/medical concerns       Wheelchair 150 feet activity     Assist  Wheelchair 150 feet activity did not occur: Safety/medical concerns       Blood pressure 108/79, pulse 83, temperature 98.5 F (36.9 C), temperature source Oral, resp. rate 18, height 5\' 7"  (1.702 m), weight 86 kg, SpO2 99 %.  Medical Problem List and Plan: 1.  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    -no full anticoagulation yet 2.   Impaired mobility -DVT/anticoagulation:  Continue SQ Lovenox. Discussing anticoagulation with neurology and pharmacy. Alen Blew would be more affordable ($60/month)             -antiplatelet therapy: ASA 325 mg/day 3. Pain Management: N/A 4. Mood: LCSW to follow for evaluation and support.              -antipsychotic agents: N/A 5. Neuropsych: This patient is not capable of making decisions on his own behalf. 6. Skin/Wound Care: Routine pressure relief measures.  7. Fluids/Electrolytes/Nutrition: NPO with tube feeds.             -continue reduced water flushes to 100 cc every 4 hours-- -  Na stable 6/21, repeat 6/23.  8. HTN: Monitor BP tid with SBP goal <140/90.             --continue losartan/day, cardura/day and Metoprolol every 6 hours Vitals:   08/24/20 1959 08/25/20 0409  BP: 117/82 108/79  Pulse: 87 83  Resp: 19 18  Temp: 97.9 F (36.6 C) 98.5 F (36.9 C)  SpO2: 92% 99%   Controlled 6/21 9. T2DM: Hgb A1C- 6.2. well controlled 6/18 CBG (last 3)  Recent Labs    08/24/20 2358 08/25/20 0412 08/25/20 0859  GLUCAP 146* 118* 149*    10. Lethargy: seems to wax and wane a bit which is expected given his CT scan  -restart ritalin 5mg , decrease amantadine to 100mg  11. PAF: Monitor HR tid--question date DOAC resumption hold off until repeat CT scan on Monday reviewed by neuro --SDH post fall 07/27/20             --continue Tikosyn bid             --appreciate pharmacy help to keep K+>4.0/Mg levels  . 12. Thoracic aortic aneurysm: Follow[facility] by [facility]. 13. Induced hypernatremia: Improving with increase water flushes  6/16 138---f/u Monday 14. Hypokalemia: replete 6/20 and repeat K+ tomorrow.   15. Urinary incontinence-  UC+ start Keflex, sensitive  16. Abdominal distention:  -improved with downward adjustment of TF/flushes  -moving bowels, actually have been loose--holding softeners, laxs  Spoke with wife reviewed chart   17.  Insomnia trial melatonin  LOS: 10  days A FACE TO FACE EVALUATION WAS PERFORMED  [name] [name] 08/25/2020, 10:10 AM

## 2020-08-25 NOTE — Progress Notes (Signed)
Physical Therapy Session Note  Patient Details  Name: [name] [name] MRN: [id number] Date of Birth: 25-Jun-1940  Today's Date: 08/25/2020 PT Individual Time: 1300-1425 PT Individual Time Calculation (min): 85 min   Short Term Goals: Week 2:  PT Short Term Goal 1 (Week 2): Patient will perform sitting balance with mod A consistently. PT Short Term Goal 2 (Week 2): Patient will tolerate safely sitting OOB >30 min 3/7 days this week. PT Short Term Goal 3 (Week 2): Patient will perform basic transfers with mod A and mod cues consistently. PT Short Term Goal 4 (Week 2): Patient will ambulate >100 ft with mod A using LRAD consistently.  Skilled Therapeutic Interventions/Progress Updates:     Patient in bed with his wife in the room upon PT arrival. Patient alert and agreeable to PT session. Patient indicated neck stiffness and/or pain with self-initiated stretching with mild grimacing during session, otherwise no outward signs or verbal report of pain throughout session.  Patient verbalized more frequently with 1-5 word phrases both spontaneously and with mod cues volitionally 2/5 trials. Patient able to name what a tape measure does, "measure" but unable to identify the name of the object when object was presented to him.   Therapeutic Activity: Bed Mobility: Patient performed supine to sit with mod-max A +2 with significant posterior lean when coming to sitting and poor awareness of lean initially, unable to correct with max multimodal cues. He performed sit to supine with min A for lower extremity management and max A +2 for adjusting and scooting up in the bed x2 Provided verbal cues for initiation and sequencing. Transfers: Patient performed sit to/from stand x5-6 trials with min-mod A +2 B HHA and x3 with RW with min A of 1 person x2 and max a of 1-2 x1 due to R leg pushing into extension. Provided min-max multimodal cues for initiation, attention, pushing up from the arm rests of the w/c to  stand, both when progressing to HHA and R with L hand on the RW with hand over hand assist for placement. Level of cuing variable based on attention in various environments and fatigue. Patient tolerated >30 min out of the room in the ortho gym in moderately stimulating environment with 1-2 other patient's in the gym, increased novel items, and a visit from [place]. Noted focused and sustained attention reduced over time with increased restlessness from patient and returned the patient to the room to decompress in a low stimulation environment with lights off, door closed, and soft music playing during manual therapy, see below.  Wheelchair Mobility:  Patient was transported in the [place] w/c with total A throughout session for energy conservation and time management.  Neuromuscular Re-ed: Patient performed the following activities focused on attention, initiation, and motor planning with functional tasks in a moderately stimulating environment: -ambulating >25 feet with mod A +2 with HHA, his wife remained in front of him as a visual target and guided him to perform 1-180 deg and 1-90 deg R turn without increased assist, which has been very challenging for the patient in previous sessions, patient provided standing rest break to hug his wife during activity with hand-over-hand assist to facilitate L arm placement -patient spontaneously grabbed a reacher while seated in the gym and appropriately showed PT how to use the device correctly with min cues, patient's wife reports that he used a reacher at home daily, attempted to have patient reach for objects on the R to pick up with the reacher at various  heights, patient with decreased visual scanning and attention to task without successful attempts due to increased stimulation in the environment -attempted visual scanning activity with tall mirror, however, patient looked further R and away from the mirror with it in front of him and placed on his R, patient not  engaged in task at all despite max verbal and visual cues -patient asked verbally to ambulate several times and attempted 3 trials, ambulating 0-5 feet limited by motor planning and fatigue, educated patient and his wife of impact of increased stimulation on fatigue levels and behaviors such as restlessness and decreased attention to mobility noted from this experience for the patient  Manual Therapy: -cervical rotation PROM with gentle over pressure and upper trap stretch with facilitation at the opposite acromion 4x30 sec R/L with improved ROM from ~20 deg to ~45 deg on each side, facilitated R rotation with inhibitory pressure to SCM -soft tissue mobilization to SCM, scalenes, upper traps, and sub occipitals for muscle tension release Patient relaxed and calm in the TIS w/c following manual therapy with feet elevated on turned over trash can, tends to move his legs off the leg rests  Patient in bed with B mitts donned and his wife at bedside at end of session with breaks locked, bed alarm set, and all needs within reach. Patient's wife asked about team conference, d/c date, and follow-up therapies. Reviewed patient's goals, therapy options depending on patient's progress, and patient's progress with all disciplines per conference notes. Patient reached out to therapist and grabbed therapist's hand with his R hand and said "thank you" x2 prior to PT leaving the room  Therapy Documentation Precautions:  Precautions Precautions: Fall Precaution Comments: cortrak Restrictions Weight Bearing Restrictions: No   Therapy/Group: Individual Therapy  Ifeoma Vallin L Kareli Hossain PT, DPT  08/25/2020, 4:09 PM

## 2020-08-25 NOTE — Progress Notes (Addendum)
Nutrition Follow Up  DOCUMENTATION CODES:   Not applicable  INTERVENTION:  Increase Jevity 1.5 cal formula via Cortrak NGT by 10 ml every 4 hours to goal rate of 65 ml/hr x 20 hours (may hold TF for up to 4 hours for therapy).  Continue 45 ml Prosource TF TID per tube.   Free water flushes of 100 ml q 4 hours per tube (MD to adjust as appropriate).  Tube feeding regimen provides 2070 kcal, 116 grams of protein, 1588 ml free water.   NUTRITION DIAGNOSIS:   Inadequate oral intake related to inability to eat as evidenced by NPO status; ongoing  GOAL:   Patient will meet greater than or equal to 90% of their needs; to be met with TF  MONITOR:   TF tolerance, Diet advancement, Labs, Weight trends, I & O's  REASON FOR ASSESSMENT:   New TF    ASSESSMENT:   80 yo male with a PMH of diastolic CHF, AVR/MVR, A-fib on coumadin/Tiksoyn who was admitted on 5/23 for difficulty speaking and R sided weakness after a fall the day before. 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 He was intubated for airway protection and family elected on DNR with comfort care due to poor prognosis.  However, patient started showing ability to follow commands on 5/24 with repeat CT showing persistent bilateral SDH.  He was taken to the OR for right craniotomy for evacuation of SDH. He tolerated extubation without difficulty but was limited by left greater than right-sided weakness with visual deficits, strong posterior bias as well as poor safety awareness with cognitive deficits.  He was admitted to rehab on 5/27 for intensive rehab program to consist of PT OT and speech therapy. 2070 kcal, 116 grams of protein, 1588 ml free water.   Pt continues on NPO status. Abdominal distention has improved. Family at bedside pt with multiple bowel movements which has aided in decreased distention. Given verbal consent from [facility], [place], to increase tube feeds back to goal rate. RD to modify  tube feeding orders. Pt has been tolerating his tube feeds well with no difficulties.   Labs and medications reviewed.   Diet Order:   Diet Order             Diet NPO time specified  Diet effective now                   EDUCATION NEEDS:   Education needs have been addressed  Skin:  Skin Assessment: Reviewed RN Assessment  Last BM:  6/20  Height:   Ht Readings from Last 1 Encounters:  08/17/20 _0  (1.702 m)    Weight:   Wt Readings from Last 1 Encounters:  08/20/20 86 kg    Ideal Body Weight:  67.3 kg  BMI:  Body mass index is 29.69 kg/m.  Estimated Nutritional Needs:   Kcal:  2000-2200  Protein:  115-230 grams  Fluid:  >2 L  Corrin Parker, MS, RD, LDN RD pager number/after hours weekend pager number on Amion.

## 2020-08-26 ENCOUNTER — Inpatient Hospital Stay (HOSPITAL_COMMUNITY): Payer: Medicare Other

## 2020-08-26 LAB — CBC
HCT: 35.7 % — ABNORMAL LOW (ref 39.0–52.0)
Hemoglobin: 11.5 g/dL — ABNORMAL LOW (ref 13.0–17.0)
MCH: 30.4 pg (ref 26.0–34.0)
MCHC: 32.2 g/dL (ref 30.0–36.0)
MCV: 94.4 fL (ref 80.0–100.0)
Platelets: 429 10*3/uL — ABNORMAL HIGH (ref 150–400)
RBC: 3.78 MIL/uL — ABNORMAL LOW (ref 4.22–5.81)
RDW: 13.4 % (ref 11.5–15.5)
WBC: 8.7 10*3/uL (ref 4.0–10.5)
nRBC: 0 % (ref 0.0–0.2)

## 2020-08-26 LAB — GLUCOSE, CAPILLARY
Glucose-Capillary: 125 mg/dL — ABNORMAL HIGH (ref 70–99)
Glucose-Capillary: 137 mg/dL — ABNORMAL HIGH (ref 70–99)
Glucose-Capillary: 140 mg/dL — ABNORMAL HIGH (ref 70–99)
Glucose-Capillary: 144 mg/dL — ABNORMAL HIGH (ref 70–99)
Glucose-Capillary: 155 mg/dL — ABNORMAL HIGH (ref 70–99)
Glucose-Capillary: 95 mg/dL (ref 70–99)

## 2020-08-26 NOTE — Progress Notes (Signed)
Pt cortrak at 34 cm marking, originally at [place]. PA Pam notified, new orders received. Cortrak consulted. Sheela Stack, LPN

## 2020-08-26 NOTE — Progress Notes (Signed)
Occupational Therapy Session Note  Patient Details  Name: [name] [name] MRN: [id number] Date of Birth: August 08, 1940  Today's Date: 08/26/2020 OT Individual Time: 0830-0930 OT Individual Time Calculation (min): 60 min   Session 2: OT Individual Time: 1030-1110 OT Individual Time Calculation (min): 40 min    Short Term Goals: Week 2:  OT Short Term Goal 1 (Week 2): Pt will bathe UB with mod A OT Short Term Goal 2 (Week 2): Pt will initiate 50% of commands during bathing with mod cueing OT Short Term Goal 3 (Week 2): Pt will complete sit > stand with max A +1  Skilled Therapeutic Interventions/Progress Updates:    Session 1: Pt received supine with his wife present, restless in bed. Pt was very internally and externally distracted throughout entire session, grabbing at anything in the environment he could reach. Pt required max A for peri care and donning pants. Pt completed bed mobility to EOB with max A. He completed a stand pivot transfer from EOB with only min A to power up and to pivot- then required MAX A to sequence sitting in the w/c. Pt completed UB bathing and dressing at the sink with great difficulty attending to task and required max multimodal cueing. Max A to don shirt. Pt very restless trying to stand so followed pt's lead and completed 25 ft of functional mobility with min A- great improvement in balance but difficulty following any direction/sequence. Edu provided to wife re cueing to prevent overstimulation- she is very receptive and supportive of her husband. Pt took a seated rest break before completing another 25 ft. Pt returned to his room and was left supine with all needs met, bed alarm set. Mitts on.    Session 2: Pt received supine with LPN present administering medication via NG. Session focused on reciprocal UE movement, activating automatic motor patterns and sitting balance. While supine, donned boxing gloves to decrease UE distraction and grabbing, and to activate  reciprocal punching forward. Pt was able to follow direction for ~5 repetitions on each side before becoming distracted, demonstrating severe deficits in sustained and focused attention despite controlled environment. Pt very restless and attempting to come EOB so assisted pt in transfer, mod A. Once EOB pt found the bottom bedrail and with this RUE support he was able to maintain sitting balance with only min A. He required max multimodal cueing to remove his hand from the bedrail and eventual removal of the bedrail to work on unsupported sitting. He required fluctuating min-max A for sitting balance with throwing task to promote forward weight shift. Pt returned to supine with max A and required max A to scoot up in bed. He was left supine with all needs met, bed alarm set. New wrist restraints donned and NG tube running.    Therapy Documentation Precautions:  Precautions Precautions: Fall Precaution Comments: cortrak Restrictions Weight Bearing Restrictions: No   Therapy/Group: Individual Therapy  Curtis Sites 08/26/2020, 6:20 AM

## 2020-08-26 NOTE — Progress Notes (Signed)
Physical Therapy Session Note  Patient Details  Name: [name] [name] MRN: [id number] Date of Birth: 11-26-1940  Today's Date: 08/26/2020 PT Individual Time: 6754-4920 PT Individual Time Calculation (min): 70 min  Short Term Goals: Week 2:  PT Short Term Goal 1 (Week 2): Patient will perform sitting balance with mod A consistently. PT Short Term Goal 2 (Week 2): Patient will tolerate safely sitting OOB >30 min 3/7 days this week. PT Short Term Goal 3 (Week 2): Patient will perform basic transfers with mod A and mod cues consistently. PT Short Term Goal 4 (Week 2): Patient will ambulate >100 ft with mod A using LRAD consistently.  Skilled Therapeutic Interventions/Progress Updates:     Patient in bed with B wrist restrains and mitts donned and his wife at bedside upon PT arrival. Patient alert throughout session and displayed no outward signs of pain throughout session.   Patient's wife reports that the patient has been increasingly restless and pulling at NG tube and catheter, leading to the need for restraints. Patient was very restless with his hands throughout session, requiring increased interventions and redirection from grabbing NG tube and catheter throughout session. Noted to be more distracted in new room location due to increased noise from the nursing station. Transported patient to 4 central>4 [place] hallway with 1/2 lights off for quiet environment during part of the session with improved attention with reduced stimulation.   Therapeutic Activity: Bed Mobility: Patient performed supine to/from sit with min-mod A, patient initiates starting mobility well, but does not complete the full motor plan until PT provides facilitation and redirection with mod-max cues.  Transfers: Patient performed sit to/from stand x8 with min-mod A +1-2 with and without RW. Noted reduced latency between cues and initiation with standing, <10 sec >50% of the time, and even spontaneous initiation for standing  x2 during session requiring PT to block patient and cue him to sit for safety.   Gait Training:  Patient ambulated ~20 feet x2 with min-mod A of 1 person using RW with multiple changes of direction, first trial following therapist's cues for turning and second trial turning initiated by patient despite cues for ambulating straight ahead. Removed RW for B HHA +2 for improved facilitation and attention with gait training. Patient ambulated >100 feet x2 using this technique with improved attention and would stop and initiate sitting spontaneously when he wanted to sit, provided mod cues for patient to verbalize that he wanted to sit, patient able to do this on second trial. Close w/c follow from patient's wife with all ambulation activities for safety.    Wheelchair Mobility:  Patient was transported in the [place] w/c with total A throughout session for energy conservation and time management. Patient in >45 deg tilt due to impulsivity and without leg rests due to risk of patient injury, as patient tends to kick his legs while seated.  Patient perseverated on brushing his teeth and wiping his nose throughout session. Suction not set up in patient's room, nurse provided set-up during session. Patient set-up for oral care when SLP arrived.  Patient in bed with his wife at bedside and handed off to SLP at end of session.   Therapy Documentation Precautions:  Precautions Precautions: Fall Precaution Comments: cortrak Restrictions Weight Bearing Restrictions: No    Therapy/Group: Individual Therapy  [name] [name] [name] [name] PT, DPT  08/26/2020, 6:31 AM

## 2020-08-26 NOTE — Progress Notes (Signed)
Patient ID: [name] [name], male   DOB: 1940/12/28, 80 y.o.   MRN: [id number]  SW received return phone call from Donna/SW with [facility] [phone]) who reported there will be a skilled bed available for pt if needed. SW will update as there is more information.   Loralee Pacas, MSW, [place] Office: [phone] Cell: [phone] Fax: [phone]

## 2020-08-26 NOTE — Progress Notes (Signed)
Speech Language Pathology Daily Session Note  Patient Details  Name: [name] [name] MRN: [id number] Date of Birth: [birth year]  Today's Date: 08/26/2020 SLP Individual Time: 9021-1155 SLP Individual Time Calculation (min): 30 min  Short Term Goals: Week 2: SLP Short Term Goal 1 (Week 2): Pt will consume therapeutic trials of ice chips and/or small amounts of water with minimal overt s/s of aspiration and mod assist for use of swallowing precautions. SLP Short Term Goal 2 (Week 2): Pt will focus his attention to a targeted stimulus in >50% of opportunities with max assist multimodal cues. SLP Short Term Goal 3 (Week 2): Pt will maintain alertness for periods of >5 minutes to participate in basic ADLs with max assist multimodal cues. SLP Short Term Goal 4 (Week 2): Pt will use multimodal means of communication (gestures, head nod/shake, pointing, verbalizing, vocalizing) to convey his immediate needs and wants to caregivers with max assist multimodal cues.  Skilled Therapeutic Interventions:   Session 1: Skilled ST treatment performed with focus on dysphagia and speech goals. Patient was sleeping on arrival with spouse at bedside. Was able to arouse patient with verbal and tactile (lightly tapping patient's hand, arm, and sternal rub) stimulation. After waking, he was able to maintain alertness approximately 15 minutes provided mod-to-max cues. Oral care was completed prior to therapeutic PO trials. Presented patient with single ice chips via spoon and hand-over-hand and Mod assist. Patient consumed 3 ice chips with suspected delayed swallow response (3-4 seconds) and no episodes of coughing or throat clearing. Displayed decreased attention to bolus during 1 occasion resulting in bolus holding and verbal and tactile cues to initiate swallow. Patient able to initiate swallow response after ~ 8 seconds with lingual pumping. Patient exhibited decreased alertness following 3rd ice chip and trials were  discontinued for safety. Facilitated automatic speech task (counting 1-10) with 100% accuracy and Min verbal cues. Patient unable to elicit days of the week. Repeated therapist by saying "Tuesday" but unable to verbalize other days in sequence. Vocal quality was aphonic. Responded to yes/no questions with head nod and an occasional verbal response. Educated spouse on swallow safety and communication strategies (providing additional time to respond, asking short/concise questions, etc.) Patient was left in bed with alarm activated and needs within reach. Cont per ST POC.  Session 2: Skilled ST treatment performed with focus on dysphagia and cognitive goals. Patient just completed PT session prior to arrival where therapist reported increased distractibility and fidgeting. ST facilitated oral care prior to PO trials using suction toothbrush with hand-over-hand and max A. Presented patient with single ice chips via spoon and hand-over-hand assist and Max A. Patient with increased distractibility impacting motor planning for self-feeding. Patient consumed 6 ice chips with suspected delayed swallow initiation (3 seconds) during 4/6 trials, and significantly delayed initiation during final 2 trials. Patient eventually able to initiate swallow response after ~10 seconds. No overt s/sx of aspiration noted among all trials. Patient was minimally responsive to ST questions with the exception of occasional subtle head nods and vocalization though unintelligible d/t aphonia and drowsiness. Patient was left in bed with wrist restraints and mits applied, bed alarm activated, and spouse at bedside. NT assisted with placement of wrist restraints. Cont per ST POC.  Pain Pain Assessment Pain Scale: 0-10 Pain Score: 0-No pain  Therapy/Group: Individual Therapy  Patty Sermons 08/26/2020, 12:29 PM

## 2020-08-26 NOTE — Progress Notes (Signed)
PROGRESS NOTE   Subjective/Complaints: Discussed with wife that Eliquis was started last night More restless this morning, but with more verbal output. Asked for coffee! Trying to communicate with me to remove his mitts, making good eye contact [place] regarding cost of Eliquis at home.    ROS: Limited due to cognitive/behavioral, no verbal output with me today    Objective:   CT HEAD WO CONTRAST  Result Date: 08/24/2020 CLINICAL DATA:  Stroke, follow-up EXAM: CT HEAD WITHOUT CONTRAST TECHNIQUE: Contiguous axial images were obtained from the base of the skull through the vertex without intravenous contrast. COMPARISON:  None. FINDINGS: Brain: Evolving right MCA territory infarction is again identified with areas of gyriform petechial hemorrhage and/or laminar necrosis. Residual right cerebral convexity subdural collection post hematoma evacuation is unchanged. Mild mass effect due to both of these processes persists. There is no new hemorrhage. No new loss of gray-white differentiation. Chronic infarct along the posterior left cerebral convexity is again noted. No hydrocephalus. Vascular: There is atherosclerotic calcification at the skull base. Skull: Right craniotomy.  Otherwise unremarkable. Sinuses/Orbits: No acute finding. Other: None. IMPRESSION: Evolving right MCA territory infarction and residual right cerebral convexity subdural collection. Mild mass effect persists. No new hemorrhage. No new loss of gray-white differentiation. Electronically Signed   By: Macy Mis M.D.   On: 08/24/2020 18:20   Recent Labs    08/24/20 0340 08/26/20 0304  WBC 9.1 8.7  HGB 11.8* 11.5*  HCT 37.3* 35.7*  PLT 428* 429*    Recent Labs    08/24/20 0340 08/25/20 0435  NA 138 138  K 3.9 3.8  CL 104 104  CO2 25 25  GLUCOSE 147* 147*  BUN 20 19  CREATININE 0.85 0.84  CALCIUM 9.0 9.0    Intake/Output Summary (Last 24 hours)  at 08/26/2020 0844 Last data filed at 08/26/2020 0418 Gross per 24 hour  Intake --  Output 500 ml  Net -500 ml        Physical Exam: Vital Signs Blood pressure (!) 137/115, pulse 89, temperature 97.8 F (36.6 C), resp. rate 16, height 5\' 7"  (1.702 m), weight 86 kg, SpO2 95 %. Gen: no distress, normal appearing HEENT: oral mucosa pink and moist, NCAT Cardio: Reg rate Chest: normal effort, normal rate of breathing Abd: soft, non-distended Ext: no edema Psych: restless, makes eye contact   Musculoskeletal:    Cervical back: no axial or limb pain     Skin:    Comments: Some ecchymoses on arms B/L - no backside breakdown,scalp incision closed Neurological: alert, restless, follows simple commands for MMT today  Right gaze preference.. Moves right side spontaneously, remains mostly non-verbal with intermittent grunts or short phrases.  Unchanged     Assessment/Plan: 1. Functional deficits which require 3+ hours per day of interdisciplinary therapy in a comprehensive inpatient rehab setting. Physiatrist is providing close team supervision and 24 hour management of active medical problems listed below. Physiatrist and rehab team continue to assess barriers to discharge/monitor patient progress toward functional and medical goals  Care Tool:  Bathing    Body parts bathed by patient: Chest, Abdomen, Front perineal area, Face, Right arm, Left arm  Body parts bathed by helper: Buttocks, Left upper leg, Right upper leg, Right lower leg, Left lower leg     Bathing assist Assist Level: Moderate Assistance - Patient 50 - 74%     Upper Body Dressing/Undressing Upper body dressing   What is the patient wearing?: Pull over shirt    Upper body assist Assist Level: Maximal Assistance - Patient 25 - 49%    Lower Body Dressing/Undressing Lower body dressing      What is the patient wearing?: Pants, Incontinence brief     Lower body assist Assist for lower body dressing: Total  Assistance - Patient < 25%     Toileting Toileting    Toileting assist Assist for toileting: Dependent - Patient 0%     Transfers Chair/bed transfer  Transfers assist     Chair/bed transfer assist level: Moderate Assistance - Patient 50 - 74%     Locomotion Ambulation   Ambulation assist   Ambulation activity did not occur: Safety/medical concerns  Assist level: Moderate Assistance - Patient 50 - 74% Assistive device: Walker-rolling Max distance: 100 ft   Walk 10 feet activity   Assist  Walk 10 feet activity did not occur: Safety/medical concerns  Assist level: Moderate Assistance - Patient - 50 - 74% Assistive device: Walker-rolling   Walk 50 feet activity   Assist Walk 50 feet with 2 turns activity did not occur: Safety/medical concerns  Assist level: Moderate Assistance - Patient - 50 - 74% Assistive device: Walker-rolling    Walk 150 feet activity   Assist Walk 150 feet activity did not occur: Safety/medical concerns         Walk 10 feet on uneven surface  activity   Assist Walk 10 feet on uneven surfaces activity did not occur: Safety/medical concerns         Wheelchair     Assist     Wheelchair activity did not occur: Safety/medical concerns         Wheelchair 50 feet with 2 turns activity    Assist    Wheelchair 50 feet with 2 turns activity did not occur: Safety/medical concerns       Wheelchair 150 feet activity     Assist  Wheelchair 150 feet activity did not occur: Safety/medical concerns       Blood pressure (!) 137/115, pulse 89, temperature 97.8 F (36.6 C), resp. rate 16, height 5\' 7"  (1.702 m), weight 86 kg, SpO2 95 %.  Medical Problem List and Plan: 1.  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  2.  Impaired mobility -DVT/anticoagulation:  Eliquis restarted 6/21- discussed with wife. Asked pharmacy how much this will cost for him at home.              -antiplatelet therapy: ASA 325 mg/day 3. Pain Management: N/A 4. Mood: LCSW to follow for evaluation and support.              -antipsychotic agents: N/A 5. Neuropsych: This patient is not capable of making decisions on his own behalf. 6. Skin/Wound Care: Routine  pressure relief measures.  7. Fluids/Electrolytes/Nutrition: NPO with tube feeds.             -continue reduced water flushes to 100 cc every 4 hours-- -Na stable 6/21, repeat 6/23.  8. HTN: Monitor BP tid with SBP goal <140/90. Isolated increase in diastolic BP this AM- continue to monitor             --continue losartan/day, cardura/day and Metoprolol every 6 hours Vitals:   08/26/20 0416 08/26/20 0828  BP: 106/81 (!) 137/115  Pulse: 60 89  Resp: 16   Temp: 97.8 F (36.6 C)   SpO2: 95%    Controlled 6/21 9. T2DM: Hgb A1C- 6.2. well controlled 6/21- continue to monitor. CBG (last 3)  Recent Labs    08/26/20 0001 08/26/20 0409 08/26/20 0815  GLUCAP 125* 137* 140*    10. Lethargy: seems to wax and wane a bit which is expected given his CT scan  -restart ritalin 5mg , continue 200mg  amantadine 11. PAF: Monitor HR tid--eliquis restarted --SDH post fall 07/27/20             --continue Tikosyn bid             --appreciate pharmacy help to keep K+>4.0/Mg levels  . 12. Thoracic aortic aneurysm: [facility] by [facility]. 13. Induced hypernatremia: Improving with increase water flushes  6/16 138---f/u Monday 14. Hypokalemia: continue repletion, repeat BMP tomorrow  15. Urinary incontinence-  UC+ start Keflex, sensitive  16. Abdominal distention:  -improved with downward adjustment of TF/flushes  -moving bowels, actually  have been loose--holding softeners, laxs  Spoke with wife reviewed chart   17.  Insomnia trial melatonin  LOS: 11 days A FACE TO FACE EVALUATION WAS PERFORMED  Izora Ribas 08/26/2020, 8:44 AM

## 2020-08-27 ENCOUNTER — Other Ambulatory Visit (HOSPITAL_COMMUNITY): Payer: Self-pay

## 2020-08-27 LAB — BASIC METABOLIC PANEL
Anion gap: 9 (ref 5–15)
BUN: 18 mg/dL (ref 8–23)
CO2: 27 mmol/L (ref 22–32)
Calcium: 9.1 mg/dL (ref 8.9–10.3)
Chloride: 103 mmol/L (ref 98–111)
Creatinine, Ser: 0.81 mg/dL (ref 0.61–1.24)
GFR, Estimated: >60 mL/min (ref >60)
Glucose, Bld: 158 mg/dL — ABNORMAL HIGH (ref 70–99)
Potassium: 3.8 mmol/L (ref 3.5–5.1)
Sodium: 139 mmol/L (ref 135–145)

## 2020-08-27 LAB — GLUCOSE, CAPILLARY
Glucose-Capillary: 128 mg/dL — ABNORMAL HIGH (ref 70–99)
Glucose-Capillary: 138 mg/dL — ABNORMAL HIGH (ref 70–99)
Glucose-Capillary: 148 mg/dL — ABNORMAL HIGH (ref 70–99)
Glucose-Capillary: 159 mg/dL — ABNORMAL HIGH (ref 70–99)
Glucose-Capillary: 160 mg/dL — ABNORMAL HIGH (ref 70–99)
Glucose-Capillary: 192 mg/dL — ABNORMAL HIGH (ref 70–99)

## 2020-08-27 MED ORDER — TRAZODONE HCL 50 MG PO TABS
25.0000 mg | ORAL_TABLET | Freq: Every day | ORAL | Status: DC
  Administered 2020-08-27 – 2020-08-28 (×2): 50 mg via NASOGASTRIC
  Filled 2020-08-27 (×2): qty 1

## 2020-08-27 MED ORDER — POTASSIUM CHLORIDE 20 MEQ PO PACK
40.0000 meq | PACK | Freq: Two times a day (BID) | ORAL | Status: DC
  Administered 2020-08-27 – 2020-09-15 (×39): 40 meq
  Filled 2020-08-27 (×39): qty 2

## 2020-08-27 NOTE — Progress Notes (Signed)
PROGRESS NOTE   Subjective/Complaints: Appreciate pharmacy's input regarding cost of different anticoagulation medications. Will discuss with wife today. Patient's chart reviewed- No issues reported overnight Vitals signs stable   ROS: Limited due to cognitive/behavioral, says thank you   Objective:   DG Abd Portable 1V  Result Date: 08/26/2020 CLINICAL DATA:  NG tube placement EXAM: PORTABLE ABDOMEN - 1 VIEW COMPARISON:  None. FINDINGS: Nonobstructive bowel gas pattern. There is a nasoenteric feeding catheter with tip overlying the distal stomach. No acute osseous abnormality. IMPRESSION: Nasoenteric feeding catheter tip overlies the distal stomach. Electronically Signed   By: Maurine Simmering   On: 08/26/2020 11:00   Recent Labs    08/26/20 0304  WBC 8.7  HGB 11.5*  HCT 35.7*  PLT 429*    Recent Labs    08/25/20 0435 08/27/20 0430  NA 138 139  K 3.8 3.8  CL 104 103  CO2 25 27  GLUCOSE 147* 158*  BUN 19 18  CREATININE 0.84 0.81  CALCIUM 9.0 9.1    Intake/Output Summary (Last 24 hours) at 08/27/2020 3570 Last data filed at 08/27/2020 0410 Gross per 24 hour  Intake --  Output 925 ml  Net -925 ml        Physical Exam: Vital Signs Blood pressure 111/76, pulse 80, temperature 98 F (36.7 C), resp. rate 20, height 5\' 7"  (1.702 m), weight 86 kg, SpO2 99 %. Gen: no distress, normal appearing HEENT: oral mucosa pink and moist, NCAT Cardio: Reg rate Chest: normal effort, normal rate of breathing Abd: soft, non-distended Ext: no edema   Psych: restless, makes eye contact   Musculoskeletal:    Cervical back: no axial or limb pain     Skin:    Comments: Some ecchymoses on arms B/L - no backside breakdown,scalp incision closed Neurological: alert, restless, follows simple commands for MMT today  Right gaze preference.. Moves right side spontaneously, remains mostly non-verbal with intermittent grunts or short  phrases.  Unchanged     Assessment/Plan: 1. Functional deficits which require 3+ hours per day of interdisciplinary therapy in a comprehensive inpatient rehab setting. Physiatrist is providing close team supervision and 24 hour management of active medical problems listed below. Physiatrist and rehab team continue to assess barriers to discharge/monitor patient progress toward functional and medical goals  Care Tool:  Bathing    Body parts bathed by patient: Chest, Abdomen, Front perineal area, Face, Right arm, Left arm   Body parts bathed by helper: Buttocks, Left upper leg, Right upper leg, Right lower leg, Left lower leg     Bathing assist Assist Level: Moderate Assistance - Patient 50 - 74%     Upper Body Dressing/Undressing Upper body dressing   What is the patient wearing?: Pull over shirt    Upper body assist Assist Level: Maximal Assistance - Patient 25 - 49%    Lower Body Dressing/Undressing Lower body dressing      What is the patient wearing?: Pants, Incontinence brief     Lower body assist Assist for lower body dressing: Total Assistance - Patient < 25%     Toileting Toileting    Toileting assist Assist for toileting: Dependent - Patient  0%     Transfers Chair/bed transfer  Transfers assist     Chair/bed transfer assist level: Moderate Assistance - Patient 50 - 74%     Locomotion Ambulation   Ambulation assist   Ambulation activity did not occur: Safety/medical concerns  Assist level: Moderate Assistance - Patient 50 - 74% Assistive device: Walker-rolling Max distance: 100 ft   Walk 10 feet activity   Assist  Walk 10 feet activity did not occur: Safety/medical concerns  Assist level: Moderate Assistance - Patient - 50 - 74% Assistive device: Walker-rolling   Walk 50 feet activity   Assist Walk 50 feet with 2 turns activity did not occur: Safety/medical concerns  Assist level: Moderate Assistance - Patient - 50 - 74% Assistive  device: Walker-rolling    Walk 150 feet activity   Assist Walk 150 feet activity did not occur: Safety/medical concerns         Walk 10 feet on uneven surface  activity   Assist Walk 10 feet on uneven surfaces activity did not occur: Safety/medical concerns         Wheelchair     Assist     Wheelchair activity did not occur: Safety/medical concerns         Wheelchair 50 feet with 2 turns activity    Assist    Wheelchair 50 feet with 2 turns activity did not occur: Safety/medical concerns       Wheelchair 150 feet activity     Assist  Wheelchair 150 feet activity did not occur: Safety/medical concerns       Blood pressure 111/76, pulse 80, temperature 98 F (36.7 C), resp. rate 20, height 5\' 7"  (1.702 m), weight 86 kg, SpO2 99 %.  Medical Problem List and Plan: 1.  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  2.  Impaired mobility -DVT/anticoagulation:  Eliquis restarted 6/21- discussed with wife. Appreciate pharmacy's input regaring cost of different anticoagulation medications- discussed with wife- she will discuss with his cardiologist as well             -antiplatelet therapy: ASA 325 mg/day 3. Pain Management: N/A 4. Mood: LCSW to follow for evaluation and support.              -antipsychotic agents: N/A 5. Neuropsych: This patient is not capable of making decisions on his own behalf. 6. Skin/Wound Care: Routine pressure relief measures.  7. Fluids/Electrolytes/Nutrition: NPO with tube feeds.             -continue reduced water flushes to 100 cc every 4 hours-- -Na stable 6/23, repeat tomorrow. 8. HTN:  Monitor BP tid with SBP goal <140/90.              --continue losartan/day, cardura/day and Metoprolol every 6 hours Vitals:   08/26/20 2024 08/27/20 0403  BP: 107/71 111/76  Pulse: 93 80  Resp: 20 20  Temp: 98.3 F (36.8 C) 98 F (36.7 C)  SpO2: 95% 99%   Controlled 6/21 9. T2DM: Hgb A1C- 6.2. well controlled 6/23- continue to monitor. CBG (last 3)  Recent Labs    08/26/20 2019 08/27/20 0002 08/27/20 0346  GLUCAP 144* 138* 159*    10. Lethargy: seems to wax and wane a bit which is expected given his CT scan  -restart ritalin 5mg , continue 200mg  amantadine, appears to be helping- said thank you today! 11. PAF: Monitor HR tid--eliquis restarted --SDH post fall 07/27/20             --continue Tikosyn bid             --appreciate pharmacy help to keep K+>4.0/Mg levels  . 12. Thoracic aortic aneurysm: Followed by [facility]. 13. Induced hypernatremia: Improving with increase water flushes  6/16 138---f/u Monday 14. Hypokalemia: increase repletion to 45meq BID and replete tomorrow.   15. Urinary incontinence-  UC+ continue Keflex, sensitive  16. Abdominal distention:  -improved with downward adjustment of TF/flushes  -moving bowels, actually have been loose--holding softeners, laxs  Spoke with wife reviewed chart   17.  Insomnia trial melatonin   LOS: 12 days A FACE TO FACE EVALUATION WAS PERFORMED  Clide Deutscher Jasir Rother 08/27/2020, 6:23 AM

## 2020-08-27 NOTE — Progress Notes (Signed)
   08/27/20 0954  Restraint Every 2 Hour Monitoring  Airway Clear with Spontaneous Respirations Yes  Circulation / Skin Integrity Skin discoloration (see note);Restraint applied correctly  Emotional / Mental Status Patient calm/resting  Range of Motion Performed  Food and Fluids NPO  Elimination External urinary catheter  Patient's rights, dignity, safety maintained Yes  Can Restraints be Less Restrictive or Discontinued? No    Ecchymosis noted to bilateral wrists.

## 2020-08-27 NOTE — Progress Notes (Signed)
[facility] RN spoke with pt's nurse regarding pt's need for TL PICC; line was placed for limited venous access. Caryl Pina, LPN reported she spoke to PA earlier today who requested access remain at this time. Educated that if patient continues to need central access for blood draws or potential medications, order could be placed to exchange TL PICC for SL PICC which would reduce risk of infection. Caryl Pina reported she would send a SecureChat to PA sharing this information (PA will see in am).

## 2020-08-27 NOTE — Progress Notes (Signed)
Speech Language Pathology Daily Session Note  Patient Details  Name: [name] [name] MRN: [id number] Date of Birth: [birth year]  Today's Date: 08/27/2020 SLP Individual Time: 0233-4356 SLP Individual Time Calculation (min): 30 min  Short Term Goals: Week 2: SLP Short Term Goal 1 (Week 2): Pt will consume therapeutic trials of ice chips and/or small amounts of water with minimal overt s/s of aspiration and mod assist for use of swallowing precautions. SLP Short Term Goal 2 (Week 2): Pt will focus his attention to a targeted stimulus in >50% of opportunities with max assist multimodal cues. SLP Short Term Goal 3 (Week 2): Pt will maintain alertness for periods of >5 minutes to participate in basic ADLs with max assist multimodal cues. SLP Short Term Goal 4 (Week 2): Pt will use multimodal means of communication (gestures, head nod/shake, pointing, verbalizing, vocalizing) to convey his immediate needs and wants to caregivers with max assist multimodal cues.  Skilled Therapeutic Interventions:  Session 1: Skilled ST treatment performed with focus on dysphagia. Patient was awake upon arrival and remained aroused during duration of 30 minute session. Facilitated oral care with suction toothbrush, hand-over-hand, and max A prior to therapeutic PO trials. Presented patient with single ice-chips via spoon. Patient was dependent for self-feeding. Patient consumed 10 ice chips with suspected mildly delayed swallow initiation (2-3 seconds). Displayed increased awareness of bolus. There was one instance of bolus holding with no spontaneous swallow response likely attributed to increased distractibility. Patient cued to direct attention to SLP for verbal prompt to "swallow" where he exhibited increased effort and able to swallow volitionally. No overt s/sx of aspiration across all trials. Patient became increasingly restless and PO trials were discontinued. Patient engaged with patient and spouse through simple  greetings, and yes/no head nods. Speech intelligibility was ~25% and characterized as severely breathy. Continue per ST POC.     Session 2: Skilled ST treatment performed with focus on communication and dysphagia. Patient seen following PT session in wheelchair. Patient maintained arousal during duration of 30 minute session, although easily distractible requiring max verbal and visual cueing to attend to task and re-direct. 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 Presented patient with single ice chips via spoon with hand-over-hand and max A. Tolerated with mildly delayed (2-3 sec) swallow response (difficult to assess due to nature of masticating ice chips prior to swallow) and no overt s/sx of aspiration. Presented patient with 1/2 tsp (approximately 1.5 cc) of water via spoon. Patient tolerated 13/14 without signs or symptoms of aspiration. Immediate cough occurred during last trial which may have attributed to external distractions. Patient exhibited lingual pumping (~3-5 seconds) during 3 trials, and timely swallow response during other trials. Continue trials of ice and tsp of water as tolerated. Patient was passed off to PT at end of session. Continue per ST POC.   Pain Pain Assessment Pain Scale: 0-10 Pain Score: 0-No pain Faces Pain Scale: No hurt  Therapy/Group: Individual Therapy  Patty Sermons 08/27/2020, 12:17 PM

## 2020-08-27 NOTE — Progress Notes (Signed)
Physical Therapy Session Note  Patient Details  Name: [name] [name] MRN: [id number] Date of Birth: 03-16-1940  Today's Date: 08/27/2020 PT Individual Time: 1300-1425 PT Individual Time Calculation (min): 85 min   Short Term Goals: Week 2:  PT Short Term Goal 1 (Week 2): Patient will perform sitting balance with mod A consistently. PT Short Term Goal 2 (Week 2): Patient will tolerate safely sitting OOB >30 min 3/7 days this week. PT Short Term Goal 3 (Week 2): Patient will perform basic transfers with mod A and mod cues consistently. PT Short Term Goal 4 (Week 2): Patient will ambulate >100 ft with mod A using LRAD consistently.  Skilled Therapeutic Interventions/Progress Updates:     Patient in bed asleep with his daughter in-law in the room and his wife arriving upon PT arrival. Patient slow to arouse, improved significantly in sitting and maintained arousal throughout remainder of session. Patient with increase verbalization with up to 5-6 word phrases, continues to by hypophonic with poor enunciation of words, able to correct 25-50% of the time with max cuing. Patient was oriented to self, disoriented to place (stated [place]), and disoriented to situation. Provided orientation to patient, however, patient continued to speak about [place] throughout session, wife reports his son went there and they have gone to football games there.   Patient with more internal than external distraction today and reduced restlessness, remained calm at rest>75% of the time. Did require intermittent redirection to reduce grabbing or pulling at catheter or NG tube. Noted improved focused and sustained attention in quiet environments today.  Noted abrasions from soft wrist restraints to B wrists upon removal of restraints at beginning of session. LPN notified and applied foam dressings over abrasions prior to applying restraints at end of session. Educated on tying technique and reinforced allowing a  small amount of upper extremity ROM and time with restraints off when staff in the room, LPN in agreement. Nurse case manager notified of abrasions and working with team on improved interventions to maintain skin integrity and patient safety.   Therapeutic Activity: Bed Mobility: Donned pants and tennis shoes with total A bed level. Patient performed rolling R/L with max A +2 duet to decreased arousal to pull pants over hips. He performed supine to/from sit with min A to initiate and max multimodal cues for sequencing to come to sitting, but min cues for returning to supine.  Transfers: Patient performed stand pivot bed<>TIS w/c with mod A without AD and sit to/from stand x3 with min A +2 with B HHA. Provided verbal cues for initiation, noted increased latency for initiation with standing activities and required max cuing and encouragement.  Gait Training:  Patient ambulated 20-50 feet x3 using B HHA with min-mod A +2 and close w/c follow from his wife, shifted to B hands on therapist shoulders to reduce environmental distractions without much change, then therapist under his arm on R and L HHA +2 to promote trunk elongation. Ambulated with decreased B foot clearance L>R, decreased L knee extension in stance, forward trunk posture, mild L lean, increased internal distraction resulting in multiple start/stops during gait, and spontaneous sitting x1. Provided max multimodal cues for attention to task, erect posture, and increased foot clearance. Noted tennis shoes made gait progression more challenging and distracting to patient.   Neuromuscular Re-ed: Patient performed the following activities for improved balance, functional head turns and ROM, and cognitive engagement in his environment: -standing balance with min A +2 B HHA at [facility]  Tower window scanning environment to name and locate various objects visible, named parking deck, plants, and building with mod cues for visual scanning L>R -seated  cervical rotation with AAROM for improved motor control with head turns R/L 5-6 trials with intermittent soft tissue mobilization to reduce muscle tension and improve mobility with head turns -scanning a visual target to promote cervical motion without tactile or physical assist with max visual and verbal cuing, patient unable to attend to task or follow cues during this activity -attempted to have patient read a large blue card in his room, patient attending to the card, distracted by multiple visual stimulus on the card and unable to attend to letters therapist was pointing at, maintained sustained attention to the card >30 sec  Patient in TIS w/c handed off to Bri, SLP at end of session, returned after 30 min SLP session to transfer patient back to bed, as above. Patient in bed with B soft wrist restraints and mitts secured when handed off to LPN at end of session.  Therapy Documentation Precautions:  Precautions Precautions: Fall Precaution Comments: cortrak Restrictions Weight Bearing Restrictions: No     Therapy/Group: Individual Therapy  Ambriel Gorelick L Madlyn Crosby PT, DPT  08/27/2020, 4:40 PM

## 2020-08-27 NOTE — Plan of Care (Signed)
Behavioral Plan   Rancho Level: Rancho 4  Behavior to decrease/ eliminate: pulling at NG tube restlessness   Changes to environment:  Low stim environment: lights low, TV off, door shut if family in room Blinds open during day/shut at night Sleep/wake chart   Interventions: B mitts B wrist restraints Wear scrub pants at night (cut into shorts)  Unfasten wrist restraints when staff in room to give skin a break   Recommendations for interactions with patient: Short direct phrases Give pt time to respond verbally or initiate movements Repeat commands as needed Remove pants prior to laying down in bed  Attendees:  Jae Dire OT Cherie G

## 2020-08-27 NOTE — Progress Notes (Signed)
Occupational Therapy Session Note  Patient Details  Name: [name] [name] MRN: [id number] Date of Birth: 24-Nov-1940  Today's Date: 08/27/2020 OT Individual Time: 1000-1100 OT Individual Time Calculation (min): 60 min    Short Term Goals: Week 2:  OT Short Term Goal 1 (Week 2): Pt will bathe UB with mod A OT Short Term Goal 2 (Week 2): Pt will initiate 50% of commands during bathing with mod cueing OT Short Term Goal 3 (Week 2): Pt will complete sit > stand with max A +1  Skilled Therapeutic Interventions/Progress Updates:     Pt received in bed with no indication of pain. Wife present initially but stepping out.  ADL:  Pt completes bathing with total A. With max multimodal cuing, pt able to briefly (2s wash designated body part) before getting distracted with environmnet Pt completes UB dressing with total A with pt able to initiate but unable to pull sleeve up  Pt completes LB dressing with total A at bed level Pt completes footwear with total A with +2 sitting balance required  Therapeutic activity Pt completes seated visual scanning task to obtain horse shoe on L/at midline with total cuing and give to pt on R.  Pt left at end of session in bed with exit alarm on, wrist restraints applied, call light in reach and all needs met   Therapy Documentation Precautions:  Precautions Precautions: Fall Precaution Comments: cortrak Restrictions Weight Bearing Restrictions: No General:   Vital Signs: Therapy Vitals Temp: 98 F (36.7 C) Pulse Rate: 80 Resp: 20 BP: 111/76 Patient Position (if appropriate): Lying Oxygen Therapy SpO2: 99 % O2 Device: Room Air Pain:   ADL: ADL Eating: NPO Grooming: Dependent Where Assessed-Grooming: Bed level Upper Body Bathing: Dependent Where Assessed-Upper Body Bathing: Edge of bed Lower Body Bathing: Dependent Where Assessed-Lower Body Bathing: Bed level Upper Body Dressing: Dependent Lower Body Dressing: Dependent Where  Assessed-Lower Body Dressing: Bed level Toileting: Unable to assess Where Assessed-Toileting: Glass blower/designer: Unable to assess Toilet Transfer Method: Unable to assess Vision   Perception    Praxis   Exercises:   Other Treatments:     Therapy/Group: Individual Therapy  Tonny Branch 08/27/2020, 6:48 AM

## 2020-08-28 ENCOUNTER — Inpatient Hospital Stay (HOSPITAL_COMMUNITY): Payer: Medicare Other

## 2020-08-28 LAB — BASIC METABOLIC PANEL
Anion gap: <3 — ABNORMAL LOW (ref 5–15)
Anion gap: 8 (ref 5–15)
BUN: 35 mg/dL — ABNORMAL HIGH (ref 8–23)
BUN: 20 mg/dL (ref 8–23)
CO2: 26 mmol/L (ref 22–32)
CO2: 27 mmol/L (ref 22–32)
Calcium: 7.8 mg/dL — ABNORMAL LOW (ref 8.9–10.3)
Calcium: 9.1 mg/dL (ref 8.9–10.3)
Chloride: 122 mmol/L — ABNORMAL HIGH (ref 98–111)
Chloride: 103 mmol/L (ref 98–111)
Creatinine, Ser: 0.69 mg/dL (ref 0.61–1.24)
Creatinine, Ser: 0.8 mg/dL (ref 0.61–1.24)
GFR, Estimated: >60 mL/min (ref >60)
GFR, Estimated: >60 mL/min (ref >60)
Glucose, Bld: 175 mg/dL — ABNORMAL HIGH (ref 70–99)
Glucose, Bld: 160 mg/dL — ABNORMAL HIGH (ref 70–99)
Potassium: 5.7 mmol/L — ABNORMAL HIGH (ref 3.5–5.1)
Potassium: 4.3 mmol/L (ref 3.5–5.1)
Sodium: 150 mmol/L — ABNORMAL HIGH (ref 135–145)
Sodium: 138 mmol/L (ref 135–145)

## 2020-08-28 LAB — GLUCOSE, CAPILLARY
Glucose-Capillary: 122 mg/dL — ABNORMAL HIGH (ref 70–99)
Glucose-Capillary: 132 mg/dL — ABNORMAL HIGH (ref 70–99)
Glucose-Capillary: 136 mg/dL — ABNORMAL HIGH (ref 70–99)
Glucose-Capillary: 137 mg/dL — ABNORMAL HIGH (ref 70–99)
Glucose-Capillary: 161 mg/dL — ABNORMAL HIGH (ref 70–99)
Glucose-Capillary: 170 mg/dL — ABNORMAL HIGH (ref 70–99)

## 2020-08-28 LAB — MAGNESIUM: Magnesium: 2.1 mg/dL (ref 1.7–2.4)

## 2020-08-28 MED ORDER — LEVETIRACETAM IN NACL 500 MG/100ML IV SOLN
500.0000 mg | Freq: Two times a day (BID) | INTRAVENOUS | Status: DC | PRN
  Filled 2020-08-28: qty 100

## 2020-08-28 MED ORDER — LEVETIRACETAM 500 MG PO TABS
500.0000 mg | ORAL_TABLET | Freq: Two times a day (BID) | ORAL | Status: DC
  Administered 2020-08-28: 500 mg via ORAL
  Filled 2020-08-28: qty 1

## 2020-08-28 MED ORDER — LEVETIRACETAM IN NACL 500 MG/100ML IV SOLN
500.0000 mg | Freq: Two times a day (BID) | INTRAVENOUS | Status: DC
  Filled 2020-08-28: qty 100

## 2020-08-28 MED ORDER — LORAZEPAM 2 MG/ML IJ SOLN: 1.0000 mg | Freq: Once | INTRAMUSCULAR | Status: DC

## 2020-08-28 MED ORDER — LORAZEPAM BOLUS VIA INFUSION: 1.0000 mg | Freq: Once | INTRAVENOUS | Status: DC

## 2020-08-28 MED ORDER — LEVETIRACETAM 100 MG/ML PO SOLN
500.0000 mg | Freq: Two times a day (BID) | ORAL | Status: DC
  Administered 2020-08-29 – 2020-09-15 (×35): 500 mg
  Filled 2020-08-28 (×35): qty 5

## 2020-08-28 MED ORDER — LEVETIRACETAM IN NACL 1000 MG/100ML IV SOLN
1000.0000 mg | INTRAVENOUS | Status: AC
  Administered 2020-08-28 (×2): 1000 mg via INTRAVENOUS
  Filled 2020-08-28 (×2): qty 100

## 2020-08-28 MED ORDER — MELATONIN 5 MG PO TABS
5.0000 mg | ORAL_TABLET | Freq: Every day | ORAL | Status: DC
  Administered 2020-08-28 – 2020-09-01 (×5): 5 mg
  Filled 2020-08-28 (×5): qty 1

## 2020-08-28 MED ORDER — LORAZEPAM 2 MG/ML IJ SOLN
2.0000 mg | Freq: Once | INTRAMUSCULAR | Status: DC | PRN
  Filled 2020-08-28: qty 1

## 2020-08-28 NOTE — Progress Notes (Signed)
Speech Language Pathology Daily Session Note  Patient Details  Name: [name] [name] MRN: [id number] Date of Birth: 1940-12-09  Today's Date: 08/28/2020 SLP Individual Time: 4235-3614 SLP Individual Time Calculation (min): 30 min  Short Term Goals: Week 2: SLP Short Term Goal 1 (Week 2): Pt will consume therapeutic trials of ice chips and/or small amounts of water with minimal overt s/s of aspiration and mod assist for use of swallowing precautions. SLP Short Term Goal 2 (Week 2): Pt will focus his attention to a targeted stimulus in >50% of opportunities with max assist multimodal cues. SLP Short Term Goal 3 (Week 2): Pt will maintain alertness for periods of >5 minutes to participate in basic ADLs with max assist multimodal cues. SLP Short Term Goal 4 (Week 2): Pt will use multimodal means of communication (gestures, head nod/shake, pointing, verbalizing, vocalizing) to convey his immediate needs and wants to caregivers with max assist multimodal cues.  Skilled Therapeutic Interventions:   Session 1: Skilled ST intervention performed with focus on dysphagia and cognition. SLP removed wrist restraints during duration of session. Patient frequently reached toward nose and forehead requiring close observation to ensure he would not pull on NG tube, but no issues with the support of his spouse at bedside. Facilitated oral care with suction toothbrush and hand-over-hand with Min A. Patient remained aroused during duration of 30 minute session with Max verbal/visual/tactile cues to re-direct attention due to internal and external distractibility and fidgeting. Patient consumed 3 ice chips with delayed swallow initiation (3-4 sec) during 2/3 trials, and bolus holding/significantly delayed (10 seconds) initiation during 3rd trial. Eventually swallowed following less fidgeting. No overt s/sx of aspiration noted with ice-chips. Patient consumed 8 trials of approximately 2cc of water by tsp and hand-over-hand  support (max A). Displayed improved swallow initiation (1-2 second delay) suspect due to increased volume/weight as compared to ice chips. No overt s/sx of aspiration across all trials. Patient was more verbally responsive and interactive to therapist and spouse. Responded to biographical questions during 50% of occasions provided max A verbal cues.Vocal quality remains severely breathy resulting in <25% intelligibility. Patient was left in bed with wrist restraints and mits in place, alarm activated, and spouse at bedside. Continue per ST POC.  Session 2: Skilled ST intervention performed with focus on dysphagia and communication. SLP removed wrist restraints during duration of session. Patient was less restless and rarely touched NG tube or face. Patient consumed 3/3 ice chip trials with no overt s/sx of aspiration and mildly delayed swallow response (~3 seconds). Patient consumed 2.5cc of water by tsp x2 with no overt s/sx of aspiration and ~2 second swallow delay. Consumed 5cc of water by small medicine cup x4 with no overt s/sx of aspiration and timely swallow initiation. Patient was able to hold on to cup with occasional hand-over-hand assist which allowed him to better control rate in which he received PO trials. Presented with improved focus on bolus and swallow initiation. Suspect holding the cup improved patient's readiness and attention to bolus. Patient verbally responded to biographical questions during 75% of opportunities with mod-to-max A verbal cues. He engaged in simple conversation with spouse regarding their marriage, favorite restaurant, and details about close friends with single word responses and short sentences. Benefited from field of 2 choices to enhance accuracy of responses when unclear to listener. Improved intelligibility and ability to articulate responses with more precision, although severely breathy vocal quality (aphonic at times) substantially limits communication. Patient was  left in bed with  wrist restraints and mits in place, bed alarm activated, and spouse at bedside. Continue per ST POC.   Pain Pain Assessment Pain Scale: 0-10 Pain Score: 0-No pain  Therapy/Group: Individual Therapy  Patty Sermons 08/28/2020, 12:41 PM

## 2020-08-28 NOTE — Progress Notes (Signed)
Physical Therapy Session Note  Patient Details  Name: [name] [name] MRN: [id number] Date of Birth: 31-Jan-1941  Today's Date: 08/28/2020 PT Individual Time: 0800-0900 PT Individual Time Calculation (min): 60 min   Short Term Goals: Week 2:  PT Short Term Goal 1 (Week 2): Patient will perform sitting balance with mod A consistently. PT Short Term Goal 2 (Week 2): Patient will tolerate safely sitting OOB >30 min 3/7 days this week. PT Short Term Goal 3 (Week 2): Patient will perform basic transfers with mod A and mod cues consistently. PT Short Term Goal 4 (Week 2): Patient will ambulate >100 ft with mod A using LRAD consistently.  Skilled Therapeutic Interventions/Progress Updates:     Pt received supine in bed to start session, appears agreeable to PT tx and doesn't appear to be in pain. Asked RN to pause coretrack feedings to allow freedom of mobility during session. Pt with bilateral wrist restraints and mittens on - removed this and reapplied at end of session. Pt with frequent efforts during session to grab coretrack feeding tube and needed frequent hand-over-hand assist/blocking to reduce efforts. Wife arriving during beginning of the session and was present throughout for encouragement and w/c follow during gait. Pt noted to be incontinent of urine and bowel on arrival with condom catheter partially detatched. Required totalA for pericare and brief change. He requires modA +2 to roll in bed for pericare. Donned shorts with totalA at bed level as well. Required maxA for supine<>sit via log rolling technique with use of bed features. Requires modA for initial sitting balance due to L lateral and posterior lean. Completed stand<>pivot transfer with modA with +2 assist for stabilizing w/c and safety - cues for "dancing" with face-to-face technique to allow turning to sit. Pt able to scoot himself backwards in sitting with minA and mod cues. Wheeled to the central>north hallway (by w/c room) for  reduced distractions during remaining session. He ambulated 46ft +2 modA with RW + 146ft +2 modA with RW (seated rest break) - wife in front of him to encourage him to "walk to her" and to reduce his downward gaze. His gait deficits include short shuffling steps with moderate posterior bias, ridged trunk, and minimal pelvic rotation. He needed assist for RW management to prevent him from pushing it too far forwards and benefited from cues to keep body within walker frame. He was returned to room at end of session. Needed to change bed linen and this was completed prior to performing stand<>pivot with modA (similar technique as above) back to bed with +2 assist. He required maxA for returning to supine. Restraints reapplied and all needs met at end of session, bed alarm on, wife remained at bedside.   Therapy Documentation Precautions:  Precautions Precautions: Fall Precaution Comments: cortrak Restrictions Weight Bearing Restrictions: No General:     Therapy/Group: Individual Therapy  Tamario Heal P Kamelia Lampkins PT 08/28/2020, 7:47 AM

## 2020-08-28 NOTE — Significant Event (Signed)
Rapid Response Event Note   Reason for Call :  90 sec seizure Per staff:  patient had left facial twitching then full body twitching, Left greater than right.  They also described that he had some "gasping" of breath.  Initial Focused Assessment:  Upon my arrival patient is drowsy. Lung sounds clear, decreased bases Heart tones regular BP 116/65  HR 50 then improved quickly to 70s  Patient is drowsy but able to arouse with painful stimuli. Once awake he is able to follow commands and speaks very softly and difficult to understand. Left side weakness since CVA.  No new neuro deficits noted.  Dr Lorrin Goodell at bedside to assess patient.  He spoke with wife at bedside and son via phone.  Interventions:  Stat Head CT 2g Keppra given IV   Plan of Care:     Event Summary:   MD Notified: Algis Liming Call Time: 7473 Arrival Time: 4037 End Time:  0964  Raliegh Ip, RN

## 2020-08-28 NOTE — Progress Notes (Addendum)
Occupational Therapy TBI Note  Patient Details  Name: TREVONNE NYLAND MRN: [id number] Date of Birth: 08/10/1940  Today's Date: 08/28/2020 OT Individual Time: 7340-3709 Total Time: 56 minutes   Short Term Goals: Week 1:  OT Short Term Goal 1 (Week 1): Pt will attend to ADL task for 1 minute with mod cueing OT Short Term Goal 1 - Progress (Week 1): Progressing toward goal OT Short Term Goal 2 (Week 1): Pt will don shirt with max A OT Short Term Goal 2 - Progress (Week 1): Met OT Short Term Goal 3 (Week 1): Pt will use BUE to bathe UB with mod A OT Short Term Goal 3 - Progress (Week 1): Progressing toward goal  Skilled Therapeutic Interventions/Progress Updates:    Pt received in bed with wife at bedside and consented to OT tx. Pt had mitts and wrist restraints on that were reapplied after session. Pt's wife requested pt to shower, pt req 2 person assist to sit EOB and only follows commands 10% of the time. Pt walked with mod A and RW from bed to tub bench. Pt req max cuing to sit down on tub bench and once he sits down, he does not turn to put his feet inside shower. Therapist provided tactile cuing and mod-max A to hold pt upright as pt demo's hard retrograde lean while sitting. Pt req max x 2 to bathe, as pt consistently tries to lean back ands extend while seated on tub bench, then pt required 3 person assist to transfer from tub bench to w/c as pt unable to sequence steps for transfer and pushing against therapist when cued to stand. Pt then wheeled back to room and transferred with 2 helpers back to bed, req max A for UBD and 2 helpers for LB dressing. Session took extra time 2/2 pt's inability to follow simple commands and therapist having to redirect pt as he tries to pull at cortrack and catheter. After session, pt left up in bed with HOB raised, alarm on, restraints reapplied, and all needs met.   Therapy Documentation Precautions:  Precautions Precautions: Fall Precaution Comments:  cortrak Restrictions Weight Bearing Restrictions: No  Vital Signs:   Pain: Pain Assessment Pain Scale: 0-10 Pain Score: 0-No pain Faces Pain Scale: No hurt Agitated Behavior Scale: TBI Observation Details Observation Environment: pt room Start of observation period - Date: 08/28/20 Start of observation period - Time: 1401 End of observation period - Date: 08/28/20 End of observation period - Time: 1457 Agitated Behavior Scale (DO NOT LEAVE BLANKS) Short attention span, easy distractibility, inability to concentrate: Present to an extreme degree Impulsive, impatient, low tolerance for pain or frustration: Present to a moderate degree Uncooperative, resistant to care, demanding: Present to a slight degree Violent and/or threatening violence toward people or property: Absent Explosive and/or unpredictable anger: Absent Rocking, rubbing, moaning, or other self-stimulating behavior: Present to a slight degree Pulling at tubes, restraints, etc.: Present to an extreme degree Wandering from treatment areas: Absent Restlessness, pacing, excessive movement: Present to a moderate degree Repetitive behaviors, motor, and/or verbal: Present to a slight degree Rapid, loud, or excessive talking: Absent Sudden changes of mood: Absent Easily initiated or excessive crying and/or laughter: Absent Self-abusiveness, physical and/or verbal: Absent Agitated behavior scale total score: 27     Therapy/Group: Individual Therapy  Danzel Marszalek 08/28/2020, 4:03 PM

## 2020-08-28 NOTE — Progress Notes (Signed)
Patient working with OT when nurse was called into room for patient having facial and body twitching, Seizure like activity for 90 seconds which started on left side of face and then progressed to the rest of pts body,.This Nurse called Dr. Ranell Patrick and Jeannene Patella, PA ,Charge nurse [facility] notified of changes in patient. Got vitals and CBG all within normal limits. Rapid response was called and came to pts room. Neuro MD in room and patient was taken to CT for STAT head CT. Patient back in room with wife at bedside.

## 2020-08-28 NOTE — Progress Notes (Signed)
Occupational Therapy TBI Note  Patient Details  Name: [name] [name] MRN: [id number] Date of Birth: [birth year]  Today's Date: 08/28/2020 OT Individual Time: 1515-1530 OT Individual Time Calculation (min): 15 min    Short Term Goals: Week 1:  OT Short Term Goal 1 (Week 1): Pt will attend to ADL task for 1 minute with mod cueing OT Short Term Goal 1 - Progress (Week 1): Progressing toward goal OT Short Term Goal 2 (Week 1): Pt will don shirt with max A OT Short Term Goal 2 - Progress (Week 1): Met OT Short Term Goal 3 (Week 1): Pt will use BUE to bathe UB with mod A OT Short Term Goal 3 - Progress (Week 1): Progressing toward goal  Skilled Therapeutic Interventions/Progress Updates:     Pt received in bed with wife present. Wrist restraints applied. OT removed restraints as well as drained catheter. Pt constantly pulling at NG tube requiring +2 to hold hands as OT removes covers. OT cuing pt to initiate Les off of bed, however L half of face twitching slightly, then amplifying over 20-30 seconds. Pt states, "I want more water" Twitching increases to entire body magnified on L side.  RN alerted. Pt eyes wide and pt looks to be gasping. Vitals assessed 116/64 O2/HR WNL. Rapid called. Pt lethargic. Direct handoff to rapid RN.    Therapy Documentation Precautions:  Precautions Precautions: Fall Precaution Comments: cortrak Restrictions Weight Bearing Restrictions: No General: General OT Amount of Missed Time: 30 Minutes Vital Signs:   Pain: Pain Assessment Pain Scale: 0-10 Pain Score: 0-No pain Faces Pain Scale: No hurt Agitated Behavior Scale: TBI  Observation Details Observation Environment: pt room Start of observation period - Date: 08/28/20 Start of observation period - Time: 1401 End of observation period - Date: 08/28/20 End of observation period - Time: 1457 Agitated Behavior Scale (DO NOT LEAVE BLANKS) Short attention span, easy distractibility, inability to  concentrate: Present to an extreme degree Impulsive, impatient, low tolerance for pain or frustration: Present to a moderate degree Uncooperative, resistant to care, demanding: Present to a slight degree Violent and/or threatening violence toward people or property: Absent Explosive and/or unpredictable anger: Absent Rocking, rubbing, moaning, or other self-stimulating behavior: Present to a slight degree Pulling at tubes, restraints, etc.: Present to an extreme degree Wandering from treatment areas: Absent Restlessness, pacing, excessive movement: Present to a moderate degree Repetitive behaviors, motor, and/or verbal: Present to a slight degree Rapid, loud, or excessive talking: Absent Sudden changes of mood: Absent Easily initiated or excessive crying and/or laughter: Absent Self-abusiveness, physical and/or verbal: Absent Agitated behavior scale total score: 27  ADL: ADL Eating: NPO Grooming: Dependent Where Assessed-Grooming: Bed level Upper Body Bathing: Dependent Where Assessed-Upper Body Bathing: Edge of bed Lower Body Bathing: Dependent Where Assessed-Lower Body Bathing: Bed level Upper Body Dressing: Dependent Lower Body Dressing: Dependent Where Assessed-Lower Body Dressing: Bed level Toileting: Unable to assess Where Assessed-Toileting: Glass blower/designer: Unable to assess Toilet Transfer Method: Unable to assess Vision   Perception   Praxis   Exercises:   Other Treatments:     Therapy/Group: Individual Therapy  Tonny Branch 08/28/2020, 4:06 PM

## 2020-08-28 NOTE — Progress Notes (Addendum)
NEUROLOGY CONSULTATION PROGRESS NOTE   Date of service: August 28, 2020 Patient Name: [name] [name] MRN:  [id number] DOB:  21-Mar-1940  Brief HPI  [name] [name] is a 80 y.o. male with PMH significant for pmh of atrial fibrillation on warfarin, aortic valve replacement, mitral valve replacement, acute rheumatic pericarditis (at age 2/7), stroke (2015, secondary to A. 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 The etiology of his stroke is afib with interruption of AC due to recent traumatic SDH.    Interval Hx   Was called by the STAT team today for a seizure. Patient was working with speech therapy when he had 20 secs of L facial twitching, followed by full body twitching (left worse than right) that lasted a total of 90 secs and resolved.  Vitals   Vitals:   08/28/20 0444 08/28/20 1153 08/28/20 1528 08/28/20 1549  BP: 117/74 109/71 116/65 123/77  Pulse: 82 78 (!) 50 72  Resp: 20 18    Temp: 97.9 F (36.6 C) 97.8 F (36.6 C)    TempSrc:      SpO2: 100% 98% 100%   Weight:      Height:         Body mass index is 29.69 kg/m.  Physical Exam   General: Laying comfortably in bed; in no acute distress. HENT: Normal oropharynx and mucosa. Normal external appearance of ears and nose.  Neck: Supple, no pain or tenderness  CV: No JVD. No peripheral edema.  Pulmonary: Symmetric Chest rise. Normal respiratory effort.  Ext: No cyanosis, edema, or deformity  Skin: No rash. Normal palpation of skin.  Musculoskeletal: Normal digits and nails by inspection. No clubbing.  Neurologic Examination  Mental status/Cognition: Somnolent, partially opens eyes to voice, briefly keeps them open before closing. Unable to assess full orientation given aphasia and post ictal.   Speech/language: dysarthric speech, soft. Does say "My name is (long pause, followed by) [name] [name]" does give thumbs up when asked. Cranial nerves:   CN II Pupils equal and reactive to light   CN III,IV,VI    CN V    CN VII Mild L facial droop.   CN VIII Turns head towards speech   CN IX & X    CN XI    CN XII    Motor:  Muscle bulk: poor, tone normal Moves all extremities spontaneously and gives a thumbs up on command and wiggles toes.   Labs   Basic Metabolic Panel:  Lab Results  Component Value Date   NA 138 08/28/2020   K 4.3 08/28/2020   CO2 27 08/28/2020   GLUCOSE 160 (H) 08/28/2020   BUN 20 08/28/2020   CREATININE 0.80 08/28/2020   CALCIUM 9.1 08/28/2020   GFRNONAA >60 08/28/2020   GFRAA >60 07/24/2019   HbA1c:  Lab Results  Component Value Date   HGBA1C 6.2 (H) 08/08/2020   LDL:  Lab Results  Component Value Date   LDLCALC 56 08/08/2020   Urine Drug Screen:     Component Value Date/Time   LABOPIA NONE DETECTED 08/05/2013 2207   COCAINSCRNUR NONE DETECTED 08/05/2013 2207   LABBENZ NONE DETECTED 08/05/2013 2207   AMPHETMU NONE DETECTED 08/05/2013 2207   THCU NONE DETECTED 08/05/2013 2207  LABBARB NONE DETECTED 08/05/2013 2207    Alcohol Level     Component Value Date/Time   [facility] <11 08/05/2013 2034   No results found for: PHENYTOIN, ZONISAMIDE, LAMOTRIGINE, LEVETIRACETA No results found for: PHENYTOIN, PHENOBARB, VALPROATE, CBMZ  Imaging and Diagnostic studies   CT HEAD WO CONTRAST(personally reviewed):  Evolving right MCA territory infarct with slightly increased petechial hemorrhage and/or laminar necrosis. No mass occupying acute hemorrhagic transformation. Similar small right cerebral convexity subdural collection. Mass effect appears mildly improved with decreased (now 3 mm) of leftward midline shift at the foramen of [place]ro.    Impression   [name] [name] is a 80 y.o. male with PMH significant for atrial fibrillation on warfarin, aortic  valve replacement, mitral valve replacement, acute rheumatic pericarditis (at age 3/7), stroke (2015, secondary to A. 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 AC was resumed with Eliquis 5mg  BID on 6/21.  I was asked to assess hium urgently by the STAT team for an approximately 90 secs episode of seizure. Seizure resolved itself and he is post ictal but following commands and not in status epilepticus.  STAT CTH was obtained and was notable for evolving RMCA infarct with similar R SDH and improved mass effect.  Recommendations  - I ordered Keppra 2G IV once - I ordered Keppra 500mg  BID PO or IV - I ordered seizure precautions - I ordered MRI Brain without contrast - I ordered Ativan 2mg  IV once PRN for seizure lasting more than 5 mins.  Attempted to discuss with PMR team but unfortunately no number listed on the after hours pager or AMION. The listed attending Dr. Naaman Plummer uis unavailable on secure chat. Left a message for Dr. Ranell Patrick with PMR team over secure chat. ______________________________________________________________________  This patient is critically ill and at significant risk of neurological worsening, death and care requires constant monitoring of vital signs, hemodynamics,respiratory and cardiac monitoring, neurological assessment, discussion with family, other specialists and medical decision making of high complexity. I spent 35 minutes of neurocritical care time  in the care of  this patient. This was time spent independent of any time provided by nurse practitioner or PA.  Donnetta Simpers [facility] Pager Number [phone] 08/28/2020  4:40 PM   Thank you for the opportunity to take part in the care of this patient. If you have  any further questions, please contact the neurology consultation attending.  Signed,  Garrett Pager Number [phone]

## 2020-08-28 NOTE — Progress Notes (Signed)
PROGRESS NOTE   Subjective/Complaints: Patient's chart reviewed- No issues reported overnight Vitals signs stable  Appreciate nursing note: patient will need blood draws for electrolyte monitoring, so will place order to exchange Tl PICC for SL PICC to reduce infection risk.   ROS: Limited due to cognitive/behavioral, says thank you   Objective:   DG Abd Portable 1V  Result Date: 08/26/2020 CLINICAL DATA:  NG tube placement EXAM: PORTABLE ABDOMEN - 1 VIEW COMPARISON:  None. FINDINGS: Nonobstructive bowel gas pattern. There is a nasoenteric feeding catheter with tip overlying the distal stomach. No acute osseous abnormality. IMPRESSION: Nasoenteric feeding catheter tip overlies the distal stomach. Electronically Signed   By: Maurine Simmering   On: 08/26/2020 11:00   Recent Labs    08/26/20 0304  WBC 8.7  HGB 11.5*  HCT 35.7*  PLT 429*    Recent Labs    08/27/20 0430  NA 139  K 3.8  CL 103  CO2 27  GLUCOSE 158*  BUN 18  CREATININE 0.81  CALCIUM 9.1    Intake/Output Summary (Last 24 hours) at 08/28/2020 0542 Last data filed at 08/28/2020 0450 Gross per 24 hour  Intake --  Output 825 ml  Net -825 ml        Physical Exam: Vital Signs Blood pressure 117/74, pulse 82, temperature 97.9 F (36.6 C), resp. rate 20, height 5\' 7"  (1.702 m), weight 86 kg, SpO2 100 %. Gen: no distress, normal appearing HEENT: oral mucosa pink and moist, NCAT Cardio: Reg rate Chest: normal effort, normal rate of breathing Abd: soft, non-distended Ext: no edema   Psych: restless, makes eye contact, verbal output improving   Musculoskeletal:    Cervical back: no axial or limb pain     Skin:    Comments: Some ecchymoses on arms B/L - no backside breakdown,scalp incision closed Neurological: alert, restless, follows simple commands for MMT today  Right gaze preference.. Moves right side spontaneously, remains mostly non-verbal with  intermittent grunts or short phrases.  Unchanged     Assessment/Plan: 1. Functional deficits which require 3+ hours per day of interdisciplinary therapy in a comprehensive inpatient rehab setting. Physiatrist is providing close team supervision and 24 hour management of active medical problems listed below. Physiatrist and rehab team continue to assess barriers to discharge/monitor patient progress toward functional and medical goals  Care Tool:  Bathing    Body parts bathed by patient: Chest, Abdomen, Front perineal area, Face, Right arm, Left arm   Body parts bathed by helper: Buttocks, Left upper leg, Right upper leg, Right lower leg, Left lower leg     Bathing assist Assist Level: Moderate Assistance - Patient 50 - 74%     Upper Body Dressing/Undressing Upper body dressing   What is the patient wearing?: Pull over shirt    Upper body assist Assist Level: Maximal Assistance - Patient 25 - 49%    Lower Body Dressing/Undressing Lower body dressing      What is the patient wearing?: Pants, Incontinence brief     Lower body assist Assist for lower body dressing: Total Assistance - Patient < 25%     Toileting Toileting    Toileting assist  Assist for toileting: Dependent - Patient 0%     Transfers Chair/bed transfer  Transfers assist     Chair/bed transfer assist level: Moderate Assistance - Patient 50 - 74%     Locomotion Ambulation   Ambulation assist   Ambulation activity did not occur: Safety/medical concerns  Assist level: Moderate Assistance - Patient 50 - 74% Assistive device: Walker-rolling Max distance: 100 ft   Walk 10 feet activity   Assist  Walk 10 feet activity did not occur: Safety/medical concerns  Assist level: Moderate Assistance - Patient - 50 - 74% Assistive device: Walker-rolling   Walk 50 feet activity   Assist Walk 50 feet with 2 turns activity did not occur: Safety/medical concerns  Assist level: Moderate Assistance -  Patient - 50 - 74% Assistive device: Walker-rolling    Walk 150 feet activity   Assist Walk 150 feet activity did not occur: Safety/medical concerns         Walk 10 feet on uneven surface  activity   Assist Walk 10 feet on uneven surfaces activity did not occur: Safety/medical concerns         Wheelchair     Assist     Wheelchair activity did not occur: Safety/medical concerns         Wheelchair 50 feet with 2 turns activity    Assist    Wheelchair 50 feet with 2 turns activity did not occur: Safety/medical concerns       Wheelchair 150 feet activity     Assist  Wheelchair 150 feet activity did not occur: Safety/medical concerns       Blood pressure 117/74, pulse 82, temperature 97.9 F (36.6 C), resp. rate 20, height 5\' 7"  (1.702 m), weight 86 kg, SpO2 100 %.  Medical Problem List and Plan: 1.  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  2.  Impaired mobility -DVT/anticoagulation:  Eliquis restarted 6/21- discussed with wife. Appreciate pharmacy's input regaring cost of different anticoagulation medications- discussed with wife- she will discuss with his cardiologist as well             -antiplatelet therapy: Continue ASA 325 mg/day 3. Pain Management: N/A 4. Mood: LCSW to follow for evaluation and support.              -antipsychotic agents: N/A 5. Neuropsych: This patient is not capable of making decisions on his own behalf. 6. Skin/Wound Care: Routine pressure relief measures.  7. Fluids/Electrolytes/Nutrition: NPO with tube feeds.             -continue reduced water flushes to 100 cc every 4 hours-- -Na  stable 6/23, labs pending 6/24 8. HTN: Monitor BP tid with SBP goal <140/90.              --continue losartan/day, cardura/day and Metoprolol every 6 hours Vitals:   08/28/20 0021 08/28/20 0444  BP: (!) 139/114 117/74  Pulse: 95 82  Resp:  20  Temp:  97.9 F (36.6 C)  SpO2:  100%   Controlled 1/88- diastolic was elevated in the middle of the  night 9. T2DM: Hgb A1C- 6.2. well controlled 6/24- continue to monitor. CBG (last 3)  Recent Labs    08/27/20 2005 08/28/20 0001 08/28/20 0415  GLUCAP 160* 122* 170*    10. Lethargy: seems to wax and wane a bit which is expected given his CT scan  -restart ritalin 5mg , continue 200mg  amantadine, appears to be helping- more verbal output 11. PAF: Monitor HR tid--eliquis restarted --SDH post fall 07/27/20             --continue Tikosyn bid             --appreciate pharmacy help to keep K+>4.0/Mg levels  . 12. Thoracic aortic aneurysm: Followed by [facility]. 13. Induced hypernatremia: Improving with increase water flushes  6/16 138---f/u Monday 14. Hypokalemia: increase repletion to 89meq BID and replete tomorrow.   15. Urinary incontinence-  UC+ continue Keflex, sensitive  16. Abdominal distention:  -improved with downward adjustment of TF/flushes  -moving bowels, actually have been loose--holding softeners, laxs  Spoke with wife reviewed chart   17.  Insomnia trial melatonin   LOS: 13 days A FACE TO FACE EVALUATION WAS PERFORMED  [name] [name] 08/28/2020, 5:42 AM

## 2020-08-29 ENCOUNTER — Inpatient Hospital Stay (HOSPITAL_COMMUNITY): Payer: Medicare Other

## 2020-08-29 DIAGNOSIS — G9389 Other specified disorders of brain: Secondary | ICD-10-CM

## 2020-08-29 LAB — GLUCOSE, CAPILLARY
Glucose-Capillary: 170 mg/dL — ABNORMAL HIGH (ref 70–99)
Glucose-Capillary: 117 mg/dL — ABNORMAL HIGH (ref 70–99)
Glucose-Capillary: 115 mg/dL — ABNORMAL HIGH (ref 70–99)
Glucose-Capillary: 171 mg/dL — ABNORMAL HIGH (ref 70–99)
Glucose-Capillary: 121 mg/dL — ABNORMAL HIGH (ref 70–99)

## 2020-08-29 MED ORDER — METOPROLOL TARTRATE 25 MG/10 ML ORAL SUSPENSION
12.5000 mg | Freq: Two times a day (BID) | ORAL | Status: DC
  Administered 2020-08-29 – 2020-09-05 (×12): 12.5 mg
  Filled 2020-08-29 (×17): qty 5

## 2020-08-29 MED ORDER — TRAZODONE HCL 50 MG PO TABS
25.0000 mg | ORAL_TABLET | Freq: Every day | ORAL | Status: DC
  Administered 2020-08-29 – 2020-09-01 (×4): 25 mg via NASOGASTRIC
  Filled 2020-08-29 (×4): qty 1

## 2020-08-29 NOTE — Procedures (Signed)
Routine EEG Report  [name] [name] is a 80 y.o. 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  Report: This EEG was acquired with electrodes placed according to the International 10-20 electrode system (including Fp1, Fp2, F3, F4, C3, C4, P3, P4, O1, O2, T3, T4, T5, T6, A1, A2, Fz, Cz, Pz). The following electrodes were missing or displaced: none.  The occipital dominant rhythm was 7 Hz with overriding beta frequencies. This activity is reactive to stimulation. Drowsiness was manifested by background fragmentation; deeper stages of sleep were not identified. There was focal slowing over the left temporal region. There were no interictal epileptiform discharges. There were no electrographic seizures identified. Photic stimulation and hyperventilation were not performed due to history of recent stroke.  Impression and clinical correlation: This EEG was obtained while awake and drowsy and is abnormal due to mild diffuse slowing and focal slowing over the left temporal region, reflecting generalized cerebral dysfunction with focal dysfunction superimposed over the left temporal region. Beta frequencies reflect medication effect.  Su Monks, MD [facility] [phone]  If 7pm- 7am, please page neurology on call as listed in [place].

## 2020-08-29 NOTE — Progress Notes (Signed)
NEUROLOGY CONSULTATION PROGRESS NOTE   Date of service: August 29, 2020 Patient Name: [name] [name] MRN:  [id number] DOB:  [birth year]  Brief HPI  [name] [name] is a 80 y.o. male with PMH significant for pmh of atrial fibrillation on warfarin, aortic valve replacement, mitral valve replacement, acute rheumatic pericarditis (at age 44/7), stroke (2015, secondary to A. 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 The etiology of his stroke is afib with interruption of AC due to recent traumatic SDH.  Had a seizure on 6/25 with 20 secs of L facial twitching, followed by full body twitching (left worse than right) that lasted a total of 90 secs and resolved.   Interval Hx   Somnolent but awakens briefly to follow commands. Wife reports that he has not slept well in the last few days.  Vitals   Vitals:   08/28/20 1640 08/28/20 1947 08/28/20 2337 08/29/20 0349  BP: 115/75 106/64 101/76 93/68  Pulse: 83 90 89 79  Resp: 18 18  18   Temp: 97.7 F (36.5 C) 98 F (36.7 C)  (!) 97.5 F (36.4 C)  TempSrc: Axillary     SpO2: 100% 97%  97%  Weight:      Height:         Body mass index is 29.69 kg/m.  Physical Exam   General: Laying comfortably in bed; in no acute distress. HENT: Normal oropharynx and mucosa. Normal external appearance of ears and nose.  Neck: Supple, no pain or tenderness  CV: No JVD. No peripheral edema.  Pulmonary: Symmetric Chest rise. Normal respiratory effort.  Ext: No cyanosis, edema, or deformity  Skin: No rash. Normal palpation of skin.  Musculoskeletal: Normal digits and nails by inspection. No clubbing.  Neurologic Examination  Mental status/Cognition: Somnolent, partially opens eyes to voice, briefly keeps them open before closing.  Regards my face and will track it. Requires encouragement to stay awake.  Speech/language: Soft, whispery speech. Dis state his last name for me. Cranial nerves:   CN II Pupils equal and reactive to light   CN III,IV,VI    CN V    CN VII Mild L facial droop.   CN VIII Turns head towards speech   CN IX & X    CN XI    CN XII    Motor:  Muscle bulk: poor, tone normal Moves all extremities spontaneously and gives a high five on both side and wiggles toes to command.   Labs   Basic Metabolic Panel:  Lab Results  Component Value Date   NA 138 08/28/2020   K 4.3 08/28/2020   CO2 27 08/28/2020   GLUCOSE 160 (H) 08/28/2020   BUN 20 08/28/2020   CREATININE 0.80 08/28/2020   CALCIUM 9.1 08/28/2020   GFRNONAA >60 08/28/2020   GFRAA >60 07/24/2019   HbA1c:  Lab Results  Component Value Date   HGBA1C 6.2 (H) 08/08/2020   LDL:  Lab Results  Component Value Date   LDLCALC 56 08/08/2020   Urine Drug Screen:     Component Value Date/Time   LABOPIA NONE DETECTED 08/05/2013 2207   COCAINSCRNUR NONE DETECTED 08/05/2013 2207   LABBENZ NONE DETECTED 08/05/2013 2207   AMPHETMU NONE DETECTED 08/05/2013 2207  THCU NONE DETECTED 08/05/2013 2207   LABBARB NONE DETECTED 08/05/2013 2207    Alcohol Level     Component Value Date/Time   [facility] <11 08/05/2013 2034   No results found for: PHENYTOIN, ZONISAMIDE, LAMOTRIGINE, LEVETIRACETA No results found for: PHENYTOIN, PHENOBARB, VALPROATE, CBMZ  Imaging and Diagnostic studies   CT HEAD WO CONTRAST(personally reviewed):  Evolving right MCA territory infarct with slightly increased petechial hemorrhage and/or laminar necrosis. No mass occupying acute hemorrhagic transformation. Similar small right cerebral convexity subdural collection. Mass effect appears mildly improved with decreased (now 3 mm) of leftward midline shift at the foramen of Monro.  MRI Brain without contrast: 1. No acute or interval finding. 2. Evolving right MCA  distribution infarct and bilateral extra-axial hemorrhage. Midline shift to the left measures 3-4 mm.   rEEG: This EEG was obtained while awake and drowsy and is abnormal due to mild diffuse slowing and focal slowing over the left temporal region, reflecting generalized cerebral dysfunction with focal dysfunction superimposed over the left temporal region. Beta frequencies reflect medication effect.  Impression   [name] [name] is a 80 y.o. male with PMH significant for atrial fibrillation on warfarin, aortic valve replacement, mitral valve replacement, acute rheumatic pericarditis (at age 22/7), stroke (2015, secondary to A. 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 AC was resumed with Eliquis 5mg  BID on 6/21.  He had a seizure on 6/25 with 20 secs of L facial twitching, followed by full body twitching (left worse than right) that lasted a total of 90 secs and resolved. MRI Brain with no new infarct. Stable and expected R MCA stroke. Loaded with Keppra 2G IV once after the seizure and started on Keppra 500mg  BID.  Recommendations  - rEEG with mild diffuse slowing and left temporal focal slowing but no seizures. - Continue Keppra 500mg  BID - Agree with discontinuing Ritalin. - I ordered seizure precautions - I ordered Ativan 2mg  IV once PRN for seizure lasting more than 5 mins. - No driving for 6 months with full discharge seizure precautions listed below. - Neurology inpatient team will signoff. Please feel free to contact us with any questions or concerns.  ______________________________________________________________________   Donnetta Simpers [facility] Pager Number [phone] 08/29/2020  2:04 PM   Thank you for the opportunity to take  part in the care of this patient. If you have any further questions, please contact the neurology consultation attending.  Signed,  Donnetta Simpers [facility] Pager Number [phone]      Seizure precautions: Per [facility] statutes, patients with seizures are not allowed to drive until they have been seizure-free for six months and cleared by a physician    Use caution when using heavy equipment or power tools. Avoid working on ladders or at heights. Take showers instead of baths. Ensure the water temperature is not too high on the home water heater. Do not go swimming alone. Do not lock yourself in a room alone (i.e. bathroom). When caring for infants or small children, sit down when holding, feeding, or changing them to minimize risk of injury to the child in the event you have a seizure. Maintain good sleep hygiene. Avoid alcohol.    If patient has another seizure, call 911  and bring them back to the ED if: A.  The seizure lasts longer than 5 minutes.      B.  The patient doesn't wake shortly after the seizure or has new problems such as difficulty seeing, speaking or moving following the seizure C.  The patient was injured during the seizure D.  The patient has a temperature over 102 F (39C) E.  The patient vomited during the seizure and now is having trouble breathing    During the Seizure   - First, ensure adequate ventilation and place patients on the floor on their left side  Loosen clothing around the neck and ensure the airway is patent. If the patient is clenching the teeth, do not force the mouth open with any object as this can cause severe damage - Remove all items from the surrounding that can be hazardous. The patient may be oblivious to what's happening and may not even know what he or she is doing. If the patient is confused and wandering, either gently guide him/her away and block access to outside areas - Reassure the individual and be  comforting - Call 911. In most cases, the seizure ends before EMS arrives. However, there are cases when seizures may last over 3 to 5 minutes. Or the individual may have developed breathing difficulties or severe injuries. If a pregnant patient or a person with diabetes develops a seizure, it is prudent to call an ambulance. - Finally, if the patient does not regain full consciousness, then call EMS. Most patients will remain confused for about 45 to 90 minutes after a seizure, so you must use judgment in calling for help. - Avoid restraints but make sure the patient is in a bed with padded side rails - Place the individual in a lateral position with the neck slightly flexed; this will help the saliva drain from the mouth and prevent the tongue from falling backward - Remove all nearby furniture and other hazards from the area - Provide verbal assurance as the individual is regaining consciousness - Provide the patient with privacy if possible - Call for help and start treatment as ordered by the caregiver    After the Seizure (Postictal Stage)   After a seizure, most patients experience confusion, fatigue, muscle pain and/or a headache. Thus, one should permit the individual to sleep. For the next few days, reassurance is essential. Being calm and helping reorient the person is also of importance.   Most seizures are painless and end spontaneously. Seizures are not harmful to others but can lead to complications such as stress on the lungs, brain and the heart. Individuals with prior lung problems may develop labored breathing and respiratory distress.

## 2020-08-29 NOTE — Progress Notes (Signed)
EEG completed, results pending. 

## 2020-08-29 NOTE — Progress Notes (Signed)
Orthopedic Tech Progress Note Patient Details:  [name] [name] [birth year] [id number] Ordered brace Patient ID: [name] [name], male   DOB: [birth year], 80 y.o.   MRN: [id number]  [name] [name] 08/29/2020, 7:31 AM

## 2020-08-29 NOTE — Progress Notes (Signed)
Physical Therapy Session Note  Patient Details  Name: [name] [name] MRN: [id number] Date of Birth: Jul 14, 1940  Today's Date: 08/29/2020 PT Individual Time: [phone] and [phone] PT Individual Time Calculation (min): 54 min and 38 min   Short Term Goals: Week 2:  PT Short Term Goal 1 (Week 2): Patient will perform sitting balance with mod A consistently. PT Short Term Goal 2 (Week 2): Patient will tolerate safely sitting OOB >30 min 3/7 days this week. PT Short Term Goal 3 (Week 2): Patient will perform basic transfers with mod A and mod cues consistently. PT Short Term Goal 4 (Week 2): Patient will ambulate >100 ft with mod A using LRAD consistently.   Skilled Therapeutic Interventions/Progress Updates:   Per chart, pt experienced seizure yesterday afternoon for rouhly 90 sec. Spoke with MD, no change in therapy orders at this time except to activate seizure protocol. Pt received supine in bed mildly alert, attempting to remove condom catheter. Noted to have been incontinent of bowel. Rolling R and L with total A +2 to perform pericare and change into clean brief. Wife then present at bed side. Supine>sit with total A heavy posterior lean throughout sitting balance requiring max assist +2 to prevent posterior LOB. Orthostatic VS assessed.  Supine: 106/84, HR 83.  Sitting 0 min 83/61. HR 113 PT donned Thigh high ted hose.  Sitting 5 minutes. 96/52. Attempted reaching task with poor initiation on this day as well as only occasional head nod or shake to questions.  Sitting VS assesment 15 minutes 82/62, HR 46. Pt returned to supine with max assist from PT.  Supine BP 97/67 ,HR 123.   Pt returned to supine with max assist. Attempted to perform BLE NMR and forces attention task, but pt able to following instruction x 2 rep each leg for heel slide and SLR, then did not initiate additional repetitions.   Doffing pants in supine with total A from PT. Pt left supine in bed with wrist restraints  inplace and Wife present.   Session 2.   Pt received supine in bed . Supine>sit transfer with total A max cues for improved angteiror weight shift and sequencing. Once EOB. Pt requried total A -max assist for sitting balance x 10 minutes. PT performed orthostatic VS supine 107/77. Sitting 0 min 82/75. Sitting 5 minutes 97/65. Pt performed anterior weight shifting x 5 with 12-30 sec hold each with mod-max assist. Noted increased palor in face. Returned to supine with max assist, immediate improvement in facial coloration. BP reassessed in supine 120/82.   PT performed cervical AAROM, gentle traction, and trigger point release to upper traps/sub occipitals. 2 x 30sec each. No report of pain throughout manual therapy. Pt left supine in bed with wife present and wrist restraints in place.         Therapy Documentation Precautions:  Precautions Precautions: Fall Precaution Comments: cortrak Restrictions Weight Bearing Restrictions: Yes   Pain: Pain Assessment Pain Scale: Faces Faces Pain Scale: No hurt     Therapy/Group: Individual Therapy  Lorie Phenix 08/29/2020, 8:58 AM

## 2020-08-29 NOTE — Progress Notes (Signed)
PROGRESS NOTE   Subjective/Complaints: Yesterday afternoon had 90 second episode of seizure like activity. Appreciate neuro consult. Keppra, Ativan PRN, seizure precautions, and CT Head without contrast with evolving R MCA infarct and improved mass effect- MRI brain ordered.   ROS: Limited due to cognitive/behavioral, says thank you   Objective:   CT HEAD WO CONTRAST  Result Date: 08/28/2020 CLINICAL DATA:  Seizure. EXAM: CT HEAD WITHOUT CONTRAST TECHNIQUE: Contiguous axial images were obtained from the base of the skull through the vertex without intravenous contrast. COMPARISON:  CT head 08/24/2020. FINDINGS: Brain: No substantial change involving right MCA territory infarct with slightly increased conspicuity of areas of gyriform petechial hemorrhage and/or laminar necrosis. No mass occupying acute hemorrhagic transformation. Similar overlying right cerebral convexity subdural hemorrhage status post evacuation. Mildly improved associated mass effect with approximately 3 mm of leftward midline shift at the foramen of Monro. Similar remote infarct along the posterior left cerebral convexity unchanged. Similar ventriculomegaly. Basal cisterns are patent. Vascular: No hyperdense vessel identified. Skull: No acute fracture.  Right frontal craniotomy. Sinuses/Orbits: Small amount of fluid and mucosal thickening of the right maxillary sinus. Other: No mastoid effusions. IMPRESSION: Evolving right MCA territory infarct with slightly increased petechial hemorrhage and/or laminar necrosis. No mass occupying acute hemorrhagic transformation. Similar small right cerebral convexity subdural collection. Mass effect appears mildly improved with decreased (now 3 mm) of leftward midline shift at the foramen of Monro. Electronically Signed   By: Margaretha Sheffield MD   On: 08/28/2020 16:19   No results for input(s): WBC, HGB, HCT, PLT in the last 72  hours.   Recent Labs    08/27/20 0430 08/28/20 0442  NA 139 138  K 3.8 4.3  CL 103 103  CO2 27 27  GLUCOSE 158* 160*  BUN 18 20  CREATININE 0.81 0.80  CALCIUM 9.1 9.1    Intake/Output Summary (Last 24 hours) at 08/29/2020 0537 Last data filed at 08/28/2020 2347 Gross per 24 hour  Intake --  Output 600 ml  Net -600 ml        Physical Exam: Vital Signs Blood pressure 93/68, pulse 79, temperature (!) 97.5 F (36.4 C), resp. rate 18, height 5\' 7"  (1.702 m), weight 86 kg, SpO2 97 %. Gen: no distress, normal appearing HEENT: oral mucosa pink and moist, NCAT Cardio: Reg rate Chest: normal effort, normal rate of breathing Abd: soft, non-distended Ext: no edema   Psych: restless, makes eye contact, verbal output improving   Musculoskeletal:    Cervical back: no axial or limb pain     Skin:    Comments: Some ecchymoses on arms B/L - no backside breakdown,scalp incision closed Neurological: alert, restless, follows simple commands for MMT today  Right gaze preference.. Moves right side spontaneously, remains mostly non-verbal with intermittent grunts or short phrases.  Unchanged     Assessment/Plan: 1. Functional deficits which require 3+ hours per day of interdisciplinary therapy in a comprehensive inpatient rehab setting. Physiatrist is providing close team supervision and 24 hour management of active medical problems listed below. Physiatrist and rehab team continue to assess barriers to discharge/monitor patient progress toward functional and medical goals  Care Tool:  Bathing  Body parts bathed by patient: Chest, Abdomen, Front perineal area, Face, Right arm, Left arm   Body parts bathed by helper: Buttocks, Left upper leg, Right upper leg, Right lower leg, Left lower leg     Bathing assist Assist Level: Moderate Assistance - Patient 50 - 74%     Upper Body Dressing/Undressing Upper body dressing   What is the patient wearing?: Pull over shirt    Upper  body assist Assist Level: Maximal Assistance - Patient 25 - 49%    Lower Body Dressing/Undressing Lower body dressing      What is the patient wearing?: Pants, Incontinence brief     Lower body assist Assist for lower body dressing: Total Assistance - Patient < 25%     Toileting Toileting    Toileting assist Assist for toileting: Dependent - Patient 0%     Transfers Chair/bed transfer  Transfers assist     Chair/bed transfer assist level: Moderate Assistance - Patient 50 - 74%     Locomotion Ambulation   Ambulation assist   Ambulation activity did not occur: Safety/medical concerns  Assist level: Moderate Assistance - Patient 50 - 74% Assistive device: Walker-rolling Max distance: 100 ft   Walk 10 feet activity   Assist  Walk 10 feet activity did not occur: Safety/medical concerns  Assist level: Moderate Assistance - Patient - 50 - 74% Assistive device: Walker-rolling   Walk 50 feet activity   Assist Walk 50 feet with 2 turns activity did not occur: Safety/medical concerns  Assist level: Moderate Assistance - Patient - 50 - 74% Assistive device: Walker-rolling    Walk 150 feet activity   Assist Walk 150 feet activity did not occur: Safety/medical concerns         Walk 10 feet on uneven surface  activity   Assist Walk 10 feet on uneven surfaces activity did not occur: Safety/medical concerns         Wheelchair     Assist     Wheelchair activity did not occur: Safety/medical concerns         Wheelchair 50 feet with 2 turns activity    Assist    Wheelchair 50 feet with 2 turns activity did not occur: Safety/medical concerns       Wheelchair 150 feet activity     Assist  Wheelchair 150 feet activity did not occur: Safety/medical concerns       Blood pressure 93/68, pulse 79, temperature (!) 97.5 F (36.4 C), resp. rate 18, height 5\' 7"  (1.702 m), weight 86 kg, SpO2 97 %.  Medical Problem List and Plan: 1.   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   2.  Impaired mobility -DVT/anticoagulation:  Eliquis restarted 6/21- discussed with wife. Appreciate pharmacy's input regaring cost of different anticoagulation medications- discussed with wife- she will discuss with his cardiologist as well and let us know what she decides             -antiplatelet therapy: Continue ASA 325 mg/day 3. Pain Management: N/A 4. Mood: LCSW to follow for evaluation and support.              -antipsychotic agents: N/A  5. Neuropsych: This patient is not capable of making decisions on his own behalf. 6. Skin/Wound Care: Routine pressure relief measures.  7. Fluids/Electrolytes/Nutrition: NPO with tube feeds.             -continue reduced water flushes to 100 cc every 4 hours-- -Na stable 6/23, labs pending 6/24 8. HTN: Monitor BP tid with SBP goal <140/90.              --continue losartan/day, cardura/day and Metoprolol every 6 hours Vitals:   08/28/20 2337 08/29/20 0349  BP: 101/76 93/68  Pulse: 89 79  Resp:  18  Temp:  (!) 97.5 F (36.4 C)  SpO2:  97%   Controlled 2/54- diastolic was elevated in the middle of the night 9. T2DM: Hgb A1C- 6.2. well controlled 6/24- continue to monitor. CBG (last 3)  Recent Labs    08/28/20 2001 08/28/20 2338 08/29/20 0351  GLUCAP 132* 161* 170*    10. Lethargy: seems to wax and wane a bit which is expected given his CT scan  -d/c ritalin given possible seizure activity, continue 200mg  amantadine, appears to be 11. PAF: Monitor HR tid--eliquis restarted --SDH post fall 07/27/20             --continue Tikosyn bid             --appreciate pharmacy help to keep K+>4.0/Mg levels  . 12. Thoracic aortic aneurysm: Followed by  [facility]. 13. Induced hypernatremia: Improving with increase water flushes  Stable on 6/24, repeat Monday.  14. Hypokalemia: increase repletion to 25meq BID and replete tomorrow.   15. Urinary incontinence-  UC+ continue Keflex, sensitive  16. Abdominal distention:  -improved with downward adjustment of TF/flushes  -moving bowels, actually have been loose--holding softeners, laxs  Spoke with wife reviewed chart   17.  Insomnia: increase melatonin to 5mg . Decrease trazodone to 25mg  due to oversedation in AM as per wife's request. Continue sleep chart. Sleep improving.  18. Seizure activity: 90 second episode that self resolved on 6/24. Neuro consulted, appreciate recs, IV Ativan PRN, Keppra, seizure precautions, stat Head CT, and Brain MRI ordered. CT shows evolving MCA infarct, R SDH, and improved mass effect.   LOS: 14 days A FACE TO FACE EVALUATION WAS PERFORMED  [name] [name] 08/29/2020, 5:37 AM

## 2020-08-30 LAB — GLUCOSE, CAPILLARY
Glucose-Capillary: 135 mg/dL — ABNORMAL HIGH (ref 70–99)
Glucose-Capillary: 151 mg/dL — ABNORMAL HIGH (ref 70–99)
Glucose-Capillary: 152 mg/dL — ABNORMAL HIGH (ref 70–99)
Glucose-Capillary: 162 mg/dL — ABNORMAL HIGH (ref 70–99)
Glucose-Capillary: 165 mg/dL — ABNORMAL HIGH (ref 70–99)
Glucose-Capillary: 167 mg/dL — ABNORMAL HIGH (ref 70–99)

## 2020-08-30 NOTE — Progress Notes (Signed)
Occupational Therapy TBI Note  Patient Details  Name: [name] [name] MRN: [id number] Date of Birth: 05-20-1940  Today's Date: 08/30/2020 OT Individual Time: 1110-1210 OT Individual Time Calculation (min): 60 min    Short Term Goals: Week 2:  OT Short Term Goal 1 (Week 2): Pt will bathe UB with mod A OT Short Term Goal 2 (Week 2): Pt will initiate 50% of commands during bathing with mod cueing OT Short Term Goal 3 (Week 2): Pt will complete sit > stand with max A +1  Skilled Therapeutic Interventions/Progress Updates:    Pt received supine with no indications of pain, resting calmly. He required verbal and tactile cueing to arouse. Pt required max manual facilitation to initiate transfer to EOB. Once EOB he maintained sitting balance with mod A. He stood with only min cueing, and min A. Once standing, he required mod A for posterior bias. He required max cueing for initiating and maintaining automatic motor pattern, walking to the sink with the RW. He was internally and externally distracted heavily throughout session. Odor of BM became evident so initiated ambulatory transfer to the bathroom. Mod A overall, +2 present for safety and to manage equipment/items pt would find and pull at along the way. Pt with short, shuffling gait, often getting stuck and requiring max multimodal cueing to restart. Total A for toileting tasks. Incontinent BM and urine void- no further void on the [facility]. Pt returned to his TIS w/c, abrupt increase in pace as motor plan processed from commands and he completed final 8 ft of functional mobility at min A level. Pt required max A to wash UB today- more distracted externally. Good initiation of donning shirt and demonstrating appropriate sequence. Worked on visual scanning and midline orientation at the mirror, pt still presents with a strong R gaze preference. Mod A stand pivot transfer back to bed. Pt began grasping at brief frantically and then had urine void- demonstrating  awareness of incontinence. Total A peri care bed level. Pt was left supine with his NG tube connected and running, B mitts and wrist restraints on.   Pt required FREQUENT intervention from OT to stop pulling at NG/scratching nose.   Therapy Documentation Precautions:  Precautions Precautions: Fall Precaution Comments: cortrak Restrictions Weight Bearing Restrictions: Yes  Agitated Behavior Scale: TBI Observation Details Observation Environment: Pt room Start of observation period - Date: 08/30/20 Start of observation period - Time: 1100 End of observation period - Date: 08/30/20 End of observation period - Time: 1200 Agitated Behavior Scale (DO NOT LEAVE BLANKS) Short attention span, easy distractibility, inability to concentrate: Present to an extreme degree Impulsive, impatient, low tolerance for pain or frustration: Present to a moderate degree Uncooperative, resistant to care, demanding: Absent Violent and/or threatening violence toward people or property: Absent Explosive and/or unpredictable anger: Absent Rocking, rubbing, moaning, or other self-stimulating behavior: Present to an extreme degree Pulling at tubes, restraints, etc.: Present to an extreme degree Wandering from treatment areas: Absent Restlessness, pacing, excessive movement: Present to an extreme degree Repetitive behaviors, motor, and/or verbal: Absent Rapid, loud, or excessive talking: Absent Sudden changes of mood: Absent Easily initiated or excessive crying and/or laughter: Absent Self-abusiveness, physical and/or verbal: Absent Agitated behavior scale total score: 28    Therapy/Group: Individual Therapy  Curtis Sites 08/30/2020, 12:53 PM

## 2020-08-30 NOTE — Progress Notes (Signed)
Speech Language Pathology TBI Note  Patient Details  Name: [name] [name] MRN: [id number] Date of Birth: 1940/09/20  Today's Date: 08/30/2020 SLP Individual Time: 2423-5361 SLP Individual Time Calculation (min): 41 min  Short Term Goals: Week 2: SLP Short Term Goal 1 (Week 2): Pt will consume therapeutic trials of ice chips and/or small amounts of water with minimal overt s/s of aspiration and mod assist for use of swallowing precautions. SLP Short Term Goal 2 (Week 2): Pt will focus his attention to a targeted stimulus in >50% of opportunities with max assist multimodal cues. SLP Short Term Goal 3 (Week 2): Pt will maintain alertness for periods of >5 minutes to participate in basic ADLs with max assist multimodal cues. SLP Short Term Goal 4 (Week 2): Pt will use multimodal means of communication (gestures, head nod/shake, pointing, verbalizing, vocalizing) to convey his immediate needs and wants to caregivers with max assist multimodal cues.  Skilled Therapeutic Interventions:  Pt was seen for skilled ST targeting goals for communication and swallowing.  Pt was somnolent upon therapist's arrival but did briefly awaken (with eyes closed) to participate in PO trials of ice chips and thin liquids via teaspoon.  Pt was able to complete oral care prior to trials with mod-max verbal cues to complete task thoroughly due to inattention to his left side.  Pt demonstrated no overt s/s of aspiration with teaspoons of water or ice chips and the automaticity of his oral phase and swallow response are greatly improved from initial evaluation with no cues needed to swallow boluses.  Pt did spontaneously and sporadically verbally answer therapist's basic questions.  His voice was aphonic but his articulation of his responses was clear.  Therapist was unable to elicit further vocalizations on command despite max assist multimodal cues. Pt was left in bed with bed alarm set, restraints reapplied and call bell within  reach.  Continue per current plan of care.    Pain Pain Assessment Pain Scale: 0-10 Pain Score: 0-No pain  Agitated Behavior Scale: TBI Observation Details Observation Environment: pt room Start of observation period - Date: 08/30/20 Start of observation period - Time: 1300 End of observation period - Date: 08/30/20 End of observation period - Time: 1345 Agitated Behavior Scale (DO NOT LEAVE BLANKS) Short attention span, easy distractibility, inability to concentrate: Present to an extreme degree Impulsive, impatient, low tolerance for pain or frustration: Present to a moderate degree Uncooperative, resistant to care, demanding: Present to a slight degree Violent and/or threatening violence toward people or property: Absent Explosive and/or unpredictable anger: Absent Rocking, rubbing, moaning, or other self-stimulating behavior: Present to an extreme degree Pulling at tubes, restraints, etc.: Present to an extreme degree Wandering from treatment areas: Absent Restlessness, pacing, excessive movement: Present to a moderate degree Repetitive behaviors, motor, and/or verbal: Present to a slight degree Rapid, loud, or excessive talking: Absent Sudden changes of mood: Absent Easily initiated or excessive crying and/or laughter: Absent Self-abusiveness, physical and/or verbal: Absent Agitated behavior scale total score: 29  Therapy/Group: Individual Therapy   Chardai Gangemi, Selinda Orion 08/30/2020, 3:34 PM

## 2020-08-30 NOTE — Progress Notes (Addendum)
Physical Therapy Weekly Progress Note  Patient Details  Name: EDSEL SHIVES MRN: [id number] Date of Birth: 07/24/1940  Beginning of progress report period: August 23, 2020 End of progress report period: August 30, 2020  Today's Date: 08/30/2020 PT Individual Time: 0755-0905 PT Individual Time Calculation (min): 70 min   Patient has met 3 of 4 short term goals.  Patient with steady progress throughout the week with improved arousal, functional use of L side, and increased verbalization during therapy sessions. Continues to be limited by deficits in sustained attention deficits, worse in busy environments, L inattention, and motor planning. Patient with fluctuating performance on mobility, performing bed mobility with mod A-supervision, transfers with mod-min A with and without RW, and gait 50-100 feet max-mod A of 1-2 with a third person w/c follow for safety with or without RW. Patient has been able to sit OOB in TIS w/c with full supervision >30 min for speech therapy sessions x2 and during PT session x1 this week. Continuing cervical ROM L>R to promote cervical rotation with functional mobility and increased L attention.   Patient continues to demonstrate the following deficits muscle weakness and muscle joint tightness, decreased cardiorespiratoy endurance, impaired timing and sequencing, abnormal tone, motor apraxia, decreased coordination, and decreased motor planning, decreased visual perceptual skills and decreased visual motor skills, decreased midline orientation and decreased attention to left, decreased initiation, decreased attention, decreased awareness, decreased problem solving, decreased safety awareness, decreased memory, and delayed processing, and decreased sitting balance, decreased standing balance, decreased postural control, hemiplegia, decreased balance strategies, and difficulty maintaining precautions and therefore will continue to benefit from skilled PT intervention to increase  functional independence with mobility.  Patient progressing toward long term goals..  Continue plan of care.  PT Short Term Goals Week 2:  PT Short Term Goal 1 (Week 2): Patient will perform sitting balance with mod A consistently. PT Short Term Goal 1 - Progress (Week 2): Met PT Short Term Goal 2 (Week 2): Patient will tolerate safely sitting OOB >30 min 3/7 days this week. PT Short Term Goal 2 - Progress (Week 2): Met PT Short Term Goal 3 (Week 2): Patient will perform basic transfers with mod A and mod cues consistently. PT Short Term Goal 3 - Progress (Week 2): Met PT Short Term Goal 4 (Week 2): Patient will ambulate >100 ft with mod A using LRAD consistently. PT Short Term Goal 4 - Progress (Week 2): Progressing toward goal Week 3:  PT Short Term Goal 1 (Week 3): Patient will perform bed mobility with mod-min A of 1 person consistently. PT Short Term Goal 2 (Week 3): Patient will perform basic transfers with mod-min A of 1 person with min verbal cues. PT Short Term Goal 3 (Week 3): Patient will ambulate >100 feet with mod A and +2 w/c follow for safety consistently using LRAD. PT Short Term Goal 4 (Week 3): Patient will initiate stair training for strengthening and endurance with a functional task.  Skilled Therapeutic Interventions/Progress Updates:     Patient in bed with his wife and NT in the room upon PT arrival. Patient alert throughout session with consistent hypophonic verbalizations, audible <50% of the time. Reported un-rated neck pain with L cervical rotation in supine 2x4, improved following soft tissue mobilization to suboccipitals, upper traps and SCM.   Therapeutic Activity: Bed Mobility: Patient performed rolling R/L x3 with mod-min A +2 to perform peri-care and lower body clothing management with total A due to bladder incontinence (no condom cath applied  throughout session, LPN aware). He performed supine to sit with min/mod A. Provided mod multimodal cues for initiation  and use of upper extremities to push himself up. He performed sit to supine with supervision with min cues for initiation. Required total A +2 to boost up in the bed x2 for repositioning.  Transfers: Patient performed stand pivot bed>TIS w/c with mod A and max cues for sequencing. He performed sit to/from stand from the w/c x2 and BSC over the toilet x1 with min-mod A with RW or HHA. Provided verbal cues and hand-over-hand assist for L hand placement on RW and facilitation for initiation and forward weight shift.  Patient was incontinent of bladder during toileting, due to increased time to arrive in the bathroom, Required total A for peri-care and lower body clothing management and max A x1 to return to sitting on [facility] for safety during peri-care.   Gait Training:  Patient ambulated ~50 feet using RW with mod-max A with close w/c follow for safety and his wife in front of him as a visual target. Dr. Ranell Patrick met patient in the hallway for rounding and patient stood following ambulation >3 min with min A using a RW, maintaining sustained attention to MD's update in a quiet hallway. Patient performed functional gait training to ambulate to/from the bathroom 15-20 feet using RW max A of 1 and into the bathroom and HHA min-mod A 1-2 out of the bathroom due to increased distraction with reaching for things instead of holding the RW on entry. Distraction improved with HHA. Ambulated with forward trunk lean, decreased step length and height on R with decreased L weight shift, external distraction, and decreased safety awareness requiring max multimodal cues and increased time to turn L into the bathroom.   Patient in bed with B mitts and soft wrist restraints secured with his wife at bedside at end of session with breaks locked, bed alarm set, and all needs within reach.   Therapy Documentation Precautions:  Precautions Precautions: Fall Precaution Comments: cortrak Restrictions Weight Bearing Restrictions:  Yes   Therapy/Group: Individual Therapy  Keala Drum L Evon Lopezperez PT, DPT  08/30/2020, 4:03 PM

## 2020-08-30 NOTE — Progress Notes (Addendum)
PROGRESS NOTE   Subjective/Complaints: Appreciate neurology input/EEG: mild focal slowing but no seizure activity. MRI brain stable. Sleeping better at night, wife suggested decreasing trazodone to limit morning sedation and I agree  ROS: Limited due to cognitive/behavioral   Objective:   CT HEAD WO CONTRAST  Result Date: 08/28/2020 CLINICAL DATA:  Seizure. EXAM: CT HEAD WITHOUT CONTRAST TECHNIQUE: Contiguous axial images were obtained from the base of the skull through the vertex without intravenous contrast. COMPARISON:  CT head 08/24/2020. FINDINGS: Brain: No substantial change involving right MCA territory infarct with slightly increased conspicuity of areas of gyriform petechial hemorrhage and/or laminar necrosis. No mass occupying acute hemorrhagic transformation. Similar overlying right cerebral convexity subdural hemorrhage status post evacuation. Mildly improved associated mass effect with approximately 3 mm of leftward midline shift at the foramen of Monro. Similar remote infarct along the posterior left cerebral convexity unchanged. Similar ventriculomegaly. Basal cisterns are patent. Vascular: No hyperdense vessel identified. Skull: No acute fracture.  Right frontal craniotomy. Sinuses/Orbits: Small amount of fluid and mucosal thickening of the right maxillary sinus. Other: No mastoid effusions. IMPRESSION: Evolving right MCA territory infarct with slightly increased petechial hemorrhage and/or laminar necrosis. No mass occupying acute hemorrhagic transformation. Similar small right cerebral convexity subdural collection. Mass effect appears mildly improved with decreased (now 3 mm) of leftward midline shift at the foramen of Monro. Electronically Signed   By: Margaretha Sheffield MD   On: 08/28/2020 16:19   MR BRAIN WO CONTRAST  Result Date: 08/29/2020 CLINICAL DATA:  Neuro deficit with stroke suspected EXAM: MRI HEAD WITHOUT  CONTRAST TECHNIQUE: Multiplanar, multiecho pulse sequences of the brain and surrounding structures were obtained without intravenous contrast. COMPARISON:  Head CT from yesterday.  Brain MRI 08/07/2020 FINDINGS: Brain: Subacute infarct in the right MCA territory, diffusely involving the inferior division cortex and involving the lateral putamen. Diffuse petechial hemorrhage. 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 There is also mineralization at the basal ganglia on the right. Extra-axial subdural collections along the right more than left convexity with up to 7 mm thickness along the superior right frontal lobe. There is also epidural collection at the level of the right parietal bone flap, unchanged. Midline shift measures 3-4 mm. Encephalomalacia in the left parietal lobe adjacent to the extra-axial hemorrhage. No mass or obstructive hydrocephalus. Vascular: Preserved flow voids Skull and upper cervical spine: Unremarkable right-sided craniotomy Sinuses/Orbits: Bilateral cataract resection IMPRESSION: 1. No acute or interval finding. 2. Evolving right MCA distribution infarct and bilateral extra-axial hemorrhage. Midline shift to the left measures 3-4 mm. Electronically Signed   By: Monte Fantasia M.D.   On: 08/29/2020 11:41   EEG adult  Result Date: 08/29/2020 Derek Jack, MD     08/29/2020  7:56 PM Routine EEG Report [name] [name] is a 80 y.o. 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 Report: This EEG was acquired with electrodes placed according to the International 10-20 electrode system (including Fp1, Fp2, F3, F4, C3, C4, P3, P4, O1, O2, T3, T4, T5, T6, A1, A2, Fz, Cz, Pz). The following electrodes were missing or displaced:  none. The occipital dominant rhythm was 7 Hz with overriding beta frequencies. This activity is reactive to stimulation. Drowsiness was manifested by background fragmentation; deeper stages of  sleep were not identified. There was focal slowing over the left temporal region. There were no interictal epileptiform discharges. There were no electrographic seizures identified. Photic stimulation and hyperventilation were not performed due to history of recent stroke. Impression and clinical correlation: This EEG was obtained while awake and drowsy and is abnormal due to mild diffuse slowing and focal slowing over the left temporal region, reflecting generalized cerebral dysfunction with focal dysfunction superimposed over the left temporal region. Beta frequencies reflect medication effect. Su Monks, MD [facility] [phone] If 7pm- 7am, please page neurology on call as listed in [facility].   No results for input(s): WBC, HGB, HCT, PLT in the last 72 hours.   Recent Labs    08/28/20 0442  NA 138  K 4.3  CL 103  CO2 27  GLUCOSE 160*  BUN 20  CREATININE 0.80  CALCIUM 9.1    Intake/Output Summary (Last 24 hours) at 08/30/2020 4259 Last data filed at 08/29/2020 0830 Gross per 24 hour  Intake --  Output 400 ml  Net -400 ml        Physical Exam: Vital Signs Blood pressure 132/81, pulse 89, temperature 98.7 F (37.1 C), resp. rate 16, height 5\' 7"  (1.702 m), weight 86 kg, SpO2 98 %. Gen: no distress, normal appearing HEENT: oral mucosa pink and moist, NCAT Cardio: Reg rate Chest: normal effort, normal rate of breathing Abd: soft, non-distended Ext: no edema   Psych: restless, makes eye contact, verbal output improving   Musculoskeletal:    Cervical back: no axial or limb pain     Skin:    Comments: Some ecchymoses on arms B/L - no backside breakdown,scalp incision closed Neurological: alert, restless, follows simple commands for MMT today  Right gaze preference.. Moves right side spontaneously, remains mostly non-verbal with intermittent grunts or short phrases.  Unchanged Functional mobility: ambulating with Cherie's support with wheelchair follow, makes  good eye contact, mouths "good morning," tries to open pants, needs to urinate when walking, and taken back to room for personal care needs     Assessment/Plan: 1. Functional deficits which require 3+ hours per day of interdisciplinary therapy in a comprehensive inpatient rehab setting. Physiatrist is providing close team supervision and 24 hour management of active medical problems listed below. Physiatrist and rehab team continue to assess barriers to discharge/monitor patient progress toward functional and medical goals  Care Tool:  Bathing    Body parts bathed by patient: Chest, Abdomen, Front perineal area, Face, Right arm, Left arm   Body parts bathed by helper: Buttocks, Left upper leg, Right upper leg, Right lower leg, Left lower leg     Bathing assist Assist Level: Moderate Assistance - Patient 50 - 74%     Upper Body Dressing/Undressing Upper body dressing   What is the patient wearing?: Pull over shirt    Upper body assist Assist Level: Maximal Assistance - Patient 25 - 49%    Lower Body Dressing/Undressing Lower body dressing      What is the patient wearing?: Pants, Incontinence brief     Lower body assist Assist for lower body dressing: Total Assistance - Patient < 25%     Toileting Toileting    Toileting assist Assist for toileting: Dependent - Patient 0%     Transfers Chair/bed transfer  Transfers assist  Chair/bed transfer assist level: Moderate Assistance - Patient 50 - 74%     Locomotion Ambulation   Ambulation assist   Ambulation activity did not occur: Safety/medical concerns  Assist level: Moderate Assistance - Patient 50 - 74% Assistive device: Walker-rolling Max distance: 100 ft   Walk 10 feet activity   Assist  Walk 10 feet activity did not occur: Safety/medical concerns  Assist level: Moderate Assistance - Patient - 50 - 74% Assistive device: Walker-rolling   Walk 50 feet activity   Assist Walk 50 feet with 2  turns activity did not occur: Safety/medical concerns  Assist level: Moderate Assistance - Patient - 50 - 74% Assistive device: Walker-rolling    Walk 150 feet activity   Assist Walk 150 feet activity did not occur: Safety/medical concerns         Walk 10 feet on uneven surface  activity   Assist Walk 10 feet on uneven surfaces activity did not occur: Safety/medical concerns         Wheelchair     Assist     Wheelchair activity did not occur: Safety/medical concerns         Wheelchair 50 feet with 2 turns activity    Assist    Wheelchair 50 feet with 2 turns activity did not occur: Safety/medical concerns       Wheelchair 150 feet activity     Assist  Wheelchair 150 feet activity did not occur: Safety/medical concerns       Blood pressure 132/81, pulse 89, temperature 98.7 F (37.1 C), resp. rate 16, height 5\' 7"  (1.702 m), weight 86 kg, SpO2 98 %.  Medical Problem List and Plan: 1.  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   -reviewed imaging and EEG results with wife on 6/26 2.  Impaired mobility -DVT/anticoagulation:  Eliquis restarted 6/21- discussed with wife. Appreciate pharmacy's input regaring cost of different anticoagulation medications- discussed with wife- she will discuss with his cardiologist as well and let us know what she decides             -antiplatelet therapy: Continue ASA 325 mg/day 3. Pain Management: N/A 4. Mood: LCSW to follow for evaluation and support.              -antipsychotic agents: N/A 5. Neuropsych: This patient is not capable of making decisions on his own behalf. 6. Skin/Wound Care: Routine pressure relief measures.  7. Fluids/Electrolytes/Nutrition: NPO  with tube feeds.             -continue reduced water flushes to 100 cc every 4 hours-- -Na stable 6/23, labs pending 6/24 8. HTN: Monitor BP tid with SBP goal <140/90.              --continue losartan/day, cardura/day and Metoprolol every 6 hours Vitals:   08/29/20 2025 08/30/20 0600  BP: (!) 112/92 132/81  Pulse: 99 89  Resp: 20 16  Temp: 98 F (36.7 C) 98.7 F (37.1 C)  SpO2: 97% 98%   Controlled 6/22- diastolic slightly elevated intermittently 9. T2DM: Hgb A1C- 6.2. well controlled 6/26- continue to monitor. CBG (last 3)  Recent Labs    08/29/20  2005 08/30/20 0017 08/30/20 0443  GLUCAP 121* 152* 151*    10. Lethargy: seems to wax and wane a bit which is expected given his CT scan  -d/c ritalin given possible seizure activity, continue 200mg  amantadine, appears to be 11. PAF: Monitor HR tid--eliquis restarted --SDH post fall 07/27/20             --continue Tikosyn bid             --appreciate pharmacy help to keep K+>4.0/Mg levels  . 12. Thoracic aortic aneurysm: [facility]. 13. Induced hypernatremia: Improving with increase water flushes  Stable on 6/24, repeat Monday.  14. Hypokalemia: increase repletion to 10meq BID and replete tomorrow.   15. UTI: completed kelfex course 6/26. 16. Abdominal distention:  -improved with downward adjustment of TF/flushes  -moving bowels, actually have been loose--holding softeners, laxs  Spoke with wife reviewed chart   17.  Insomnia: increase melatonin to 5mg . Decrease trazodone to 25mg  due to oversedation in AM as per wife's request. Continue sleep chart. Sleep improving.  18. Seizure activity: 90 second episode that self resolved on 6/24. Neuro consulted, appreciate recs, IV Ativan PRN, Keppra, seizure precautions, stat Head CT, and Brain MRI ordered. CT shows evolving MCA infarct, R SDH, and improved mass effect.   LOS: 15 days A FACE TO FACE EVALUATION WAS PERFORMED  Martha Clan P Riana Tessmer 08/30/2020, 8:22 AM

## 2020-08-31 DIAGNOSIS — G479 Sleep disorder, unspecified: Secondary | ICD-10-CM

## 2020-08-31 LAB — BASIC METABOLIC PANEL
Anion gap: 6 (ref 5–15)
BUN: 19 mg/dL (ref 8–23)
CO2: 28 mmol/L (ref 22–32)
Calcium: 8.8 mg/dL — ABNORMAL LOW (ref 8.9–10.3)
Chloride: 105 mmol/L (ref 98–111)
Creatinine, Ser: 0.84 mg/dL (ref 0.61–1.24)
GFR, Estimated: >60 mL/min (ref >60)
Glucose, Bld: 189 mg/dL — ABNORMAL HIGH (ref 70–99)
Potassium: 4.2 mmol/L (ref 3.5–5.1)
Sodium: 139 mmol/L (ref 135–145)

## 2020-08-31 LAB — GLUCOSE, CAPILLARY
Glucose-Capillary: 115 mg/dL — ABNORMAL HIGH (ref 70–99)
Glucose-Capillary: 136 mg/dL — ABNORMAL HIGH (ref 70–99)
Glucose-Capillary: 140 mg/dL — ABNORMAL HIGH (ref 70–99)
Glucose-Capillary: 141 mg/dL — ABNORMAL HIGH (ref 70–99)
Glucose-Capillary: 148 mg/dL — ABNORMAL HIGH (ref 70–99)
Glucose-Capillary: 158 mg/dL — ABNORMAL HIGH (ref 70–99)
Glucose-Capillary: 163 mg/dL — ABNORMAL HIGH (ref 70–99)

## 2020-08-31 LAB — CBC
HCT: 36.9 % — ABNORMAL LOW (ref 39.0–52.0)
Hemoglobin: 11.6 g/dL — ABNORMAL LOW (ref 13.0–17.0)
MCH: 30.1 pg (ref 26.0–34.0)
MCHC: 31.4 g/dL (ref 30.0–36.0)
MCV: 95.6 fL (ref 80.0–100.0)
Platelets: 369 10*3/uL (ref 150–400)
RBC: 3.86 MIL/uL — ABNORMAL LOW (ref 4.22–5.81)
RDW: 13.5 % (ref 11.5–15.5)
WBC: 13 10*3/uL — ABNORMAL HIGH (ref 4.0–10.5)
nRBC: 0 % (ref 0.0–0.2)

## 2020-08-31 LAB — MAGNESIUM: Magnesium: 2 mg/dL (ref 1.7–2.4)

## 2020-08-31 LAB — URINALYSIS, COMPLETE (UACMP) WITH MICROSCOPIC
Bilirubin Urine: NEGATIVE
Glucose, UA: NEGATIVE mg/dL
Ketones, ur: NEGATIVE mg/dL
Nitrite: POSITIVE — AB
Protein, ur: 30 mg/dL — AB
Specific Gravity, Urine: 1.01 (ref 1.005–1.030)
WBC, UA: >50 WBC/hpf — ABNORMAL HIGH (ref 0–5)
pH: 8 (ref 5.0–8.0)

## 2020-08-31 MED ORDER — MAGNESIUM SULFATE 2 GM/50ML IV SOLN
2.0000 g | Freq: Once | INTRAVENOUS | Status: AC
  Administered 2020-08-31: 2 g via INTRAVENOUS
  Filled 2020-08-31: qty 50

## 2020-08-31 NOTE — Progress Notes (Signed)
Occupational Therapy Weekly Progress Note  Patient Details  Name: [name] [name] MRN: [id number] Date of Birth: 1940-05-23  Beginning of progress report period: August 24, 2020 End of progress report period: August 31, 2020  Today's Date: 08/31/2020 OT Individual Time: 1300-1415 OT Individual Time Calculation (min): 75 min    Patient has met 3 of 3 short term goals.  Pt continues to make progress toward his OT goals. He is able to consistently perform a sit > stand transfer with min A when he is fully attending to the task and given enough time to initiate. He has demonstrated improvement in following commands during ADLs.   Patient continues to demonstrate the following deficits: muscle weakness, decreased cardiorespiratoy endurance, motor apraxia, ataxia, decreased coordination, and decreased motor planning, decreased midline orientation and decreased attention to left, decreased initiation, decreased attention, decreased awareness, decreased problem solving, decreased safety awareness, decreased memory, and delayed processing, and decreased sitting balance, decreased standing balance, decreased postural control, and decreased balance strategies and therefore will continue to benefit from skilled OT intervention to enhance overall performance with BADL.  Patient progressing toward long term goals..  Continue plan of care.  OT Short Term Goals Week 2:  OT Short Term Goal 1 (Week 2): Pt will bathe UB with mod A OT Short Term Goal 1 - Progress (Week 2): Met OT Short Term Goal 2 (Week 2): Pt will initiate 50% of commands during bathing with mod cueing OT Short Term Goal 2 - Progress (Week 2): Met OT Short Term Goal 3 (Week 2): Pt will complete sit > stand with max A +1 OT Short Term Goal 3 - Progress (Week 2): Met Week 3:  OT Short Term Goal 1 (Week 3): Pt will complete stand pivot transfer with mod A OT Short Term Goal 2 (Week 3): Pt will complete UB bathing with min A OT Short Term Goal 3 (Week  3): Pt will sequence donning shirt with no more than mod cueing and mod A  Skilled Therapeutic Interventions/Progress Updates:    Pt received supine, sleeping soundly. He required multimodal cueing to arouse and open eyes. Mod A transfer to EOB. Min A sitting balance statically. Pt completed sit > stand with min A. He was incredibly internally and externally distracted throughout session. Focus of session on focused and sustained attention, automatic motor patterns, and standing balance/functional reaching. Standing level peri care completed with max A, then transferred pt to [facility] d/t incontinent BM and urine. Pt completed 15 ft of functional mobility with mod +2 assist, becoming too distracted by hallway railing. Remainder of session in therapy apartment to control environment more with distractions. Attempted to have pt complete functional reaching into the cabinets in standing, requiring max cueing/assist to attend to task. Pt becoming very fatigued and returned him to room. Oral care completed dependently with suction toothbrush. He transferred back to bed and was left supine with all needs met, bed alarm set. NG connected.   Therapy Documentation Precautions:  Precautions Precautions: Fall Precaution Comments: cortrak Restrictions Weight Bearing Restrictions: Yes   Therapy/Group: Individual Therapy  Curtis Sites 08/31/2020, 12:33 PM

## 2020-08-31 NOTE — Progress Notes (Signed)
PROGRESS NOTE   Subjective/Complaints: Didn't sleep quite as well last night. A little more delayed this morning than he has been. Pt had seizure 6/25. Trazodone reduced d/t ?AM sedation yesterday as well  ROS: Limited due to cognitive/behavioral    Objective:   MR BRAIN WO CONTRAST  Result Date: 08/29/2020 CLINICAL DATA:  Neuro deficit with stroke suspected EXAM: MRI HEAD WITHOUT CONTRAST TECHNIQUE: Multiplanar, multiecho pulse sequences of the brain and surrounding structures were obtained without intravenous contrast. COMPARISON:  Head CT from yesterday.  Brain MRI 08/07/2020 FINDINGS: Brain: Subacute infarct in the right MCA territory, diffusely involving the inferior division cortex and involving the lateral putamen. Diffuse petechial hemorrhage. 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 There is also mineralization at the basal ganglia on the right. Extra-axial subdural collections along the right more than left convexity with up to 7 mm thickness along the superior right frontal lobe. There is also epidural collection at the level of the right parietal bone flap, unchanged. Midline shift measures 3-4 mm. Encephalomalacia in the left parietal lobe adjacent to the extra-axial hemorrhage. No mass or obstructive hydrocephalus. Vascular: Preserved flow voids Skull and upper cervical spine: Unremarkable right-sided craniotomy Sinuses/Orbits: Bilateral cataract resection IMPRESSION: 1. No acute or interval finding. 2. Evolving right MCA distribution infarct and bilateral extra-axial hemorrhage. Midline shift to the left measures 3-4 mm. Electronically Signed   By: Monte Fantasia M.D.   On: 08/29/2020 11:41   EEG adult  Result Date: 08/29/2020 [name] Jack, MD     08/29/2020  7:56 PM Routine EEG Report [name] [name] is a 80 y.o. 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 Report: This EEG was acquired with electrodes placed according to the International 10-20 electrode system (including Fp1, Fp2, F3, F4, C3, C4, P3, P4, O1, O2, T3, T4, T5, T6, A1, A2, Fz, Cz, Pz). The following electrodes were missing or displaced: none. The occipital dominant rhythm was 7 Hz with overriding beta frequencies. This activity is reactive to stimulation. Drowsiness was manifested by background fragmentation; deeper stages of sleep were not identified. There was focal slowing over the left temporal region. There were no interictal epileptiform discharges. There were no electrographic seizures identified. Photic stimulation and hyperventilation were not performed due to history of recent stroke. Impression and clinical correlation: This EEG was obtained while awake and drowsy and is abnormal due to mild diffuse slowing and focal slowing over the left temporal region, reflecting generalized cerebral dysfunction with focal dysfunction superimposed over the left temporal region. Beta frequencies reflect medication effect. [name] Monks, MD [facility] [phone] If 7pm- 7am, please page neurology on call as listed in [place].   Recent Labs    08/31/20 0410  WBC 13.0*  HGB 11.6*  HCT 36.9*  PLT 369     Recent Labs    08/31/20 0410  NA 139  K 4.2  CL 105  CO2 28  GLUCOSE 189*  BUN 19  CREATININE 0.84  CALCIUM 8.8*    Intake/Output Summary (Last 24 hours) at 08/31/2020 4944 Last data filed at 08/31/2020 0800 Gross  per 24 hour  Intake 0 ml  Output 1025 ml  Net -1025 ml        Physical Exam: Vital Signs Blood pressure 115/71, pulse 72, temperature 97.7 F (36.5 C), resp. rate 20, height 5\' 7"  (1.702 m), weight 86 kg, SpO2 99 %. Constitutional: No distress . Vital signs reviewed. HEENT: EOMI, oral membranes moist Neck: supple Cardiovascular: RRR without murmur. No JVD    Respiratory/Chest: CTA Bilaterally without wheezes or rales. Normal effort     GI/Abdomen: BS +, non-tender, non-distended Ext: no clubbing, cyanosis, or edema Psych: somewhat alert, delayed  Musculoskeletal:    Cervical back: no axial or limb pain     Skin:    Comments: Some ecchymoses on arms B/L, wounds closed. Neurological: right gaze preference, delayed.moving all 4 spontaneously but more purposeful movement on right.  Whispers, difficulty with phonation     Assessment/Plan: 1. Functional deficits which require 3+ hours per day of interdisciplinary therapy in a comprehensive inpatient rehab setting. Physiatrist is providing close team supervision and 24 hour management of active medical problems listed below. Physiatrist and rehab team continue to assess barriers to discharge/monitor patient progress toward functional and medical goals  Care Tool:  Bathing    Body parts bathed by patient: Chest, Abdomen, Front perineal area, Face, Right arm, Left arm   Body parts bathed by helper: Buttocks, Left upper leg, Right upper leg, Right lower leg, Left lower leg     Bathing assist Assist Level: Moderate Assistance - Patient 50 - 74%     Upper Body Dressing/Undressing Upper body dressing   What is the patient wearing?: Pull over shirt    Upper body assist Assist Level: Maximal Assistance - Patient 25 - 49%    Lower Body Dressing/Undressing Lower body dressing      What is the patient wearing?: Pants, Incontinence brief     Lower body assist Assist for lower body dressing: Total Assistance - Patient < 25%     Toileting Toileting    Toileting assist Assist for toileting: Dependent - Patient 0%     Transfers Chair/bed transfer  Transfers assist     Chair/bed transfer assist level: Moderate Assistance - Patient 50 - 74%     Locomotion Ambulation   Ambulation assist   Ambulation activity did not occur: Safety/medical concerns  Assist level: Maximal Assistance - Patient 25 - 49% Assistive device: Walker-rolling Max distance: 50 ft    Walk 10 feet activity   Assist  Walk 10 feet activity did not occur: Safety/medical concerns  Assist level: Maximal Assistance - Patient 25 - 49% Assistive device: Walker-rolling   Walk 50 feet activity   Assist Walk 50 feet with 2 turns activity did not occur: Safety/medical concerns  Assist level: Maximal Assistance - Patient 25 - 49% Assistive device: Walker-rolling    Walk 150 feet activity   Assist Walk 150 feet activity did not occur: Safety/medical concerns         Walk 10 feet on uneven surface  activity   Assist Walk 10 feet on uneven surfaces activity did not occur: Safety/medical concerns         Wheelchair     Assist     Wheelchair activity did not occur: Safety/medical concerns         Wheelchair 50 feet with 2 turns activity    Assist    Wheelchair 50 feet with 2 turns activity did not occur: Safety/medical concerns       Wheelchair 150  feet activity     Assist  Wheelchair 150 feet activity did not occur: Safety/medical concerns       Blood pressure 115/71, pulse 72, temperature 97.7 F (36.5 C), resp. rate 20, height 5\' 7"  (1.702 m), weight 86 kg, SpO2 99 %.  Medical Problem List and Plan: 1.  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  Impaired mobility -DVT/anticoagulation:  Eliquis restarted 6/21- discussed with wife. Appreciate pharmacy's input regaring cost of different anticoagulation medications- discussed with wife- she will discuss with his cardiologist as well and let us know what she decides             -antiplatelet therapy: Continue ASA 325 mg/day 3. Pain Management: N/A 4. Mood: LCSW to follow for evaluation and support.              -antipsychotic agents: N/A 5. Neuropsych: This  patient is not capable of making decisions on his own behalf. 6. Skin/Wound Care: Routine pressure relief measures.  7. Fluids/Electrolytes/Nutrition: NPO with tube feeds.             -continue reduced water flushes to 100 cc every 4 hours -Na 139 6/27 8. HTN: Monitor BP tid with SBP goal <140/90.              --continue losartan/day, cardura/day and Metoprolol every 6 hours Vitals:   08/31/20 0509 08/31/20 0523  BP: (!) 162/92 115/71  Pulse: 100 72  Resp: 20   Temp: 97.7 F (36.5 C)   SpO2: 99%    Controlled 6/27  9. T2DM: Hgb A1C- 6.2. well controlled 6/26- continue to monitor. CBG (last 3)  Recent Labs    08/31/20 0018 08/31/20 0405 08/31/20 0745  GLUCAP 140* 158* 148*    10. Lethargy: seems to wax and wane a bit which is expected given his CT scan  -d/c ritalin given possible seizure activity, continue 200mg  amantadine, appears to be 11. PAF: Monitor HR tid--eliquis restarted --SDH post fall 07/27/20             --continue Tikosyn bid             --appreciate pharmacy help to keep K+>4.0/Mg levels  . 12. Thoracic aortic aneurysm: Followed by [facility]. 13. Induced hypernatremia: Improving with increase water flushes  Stable on 6/24, repeat Monday.  14. Hypokalemia: increase repletion to 41meq BID and replete tomorrow.   15. UTI: completed kelfex course 6/26.  -wbc's 13k again 6/26---recheck ua, ucx 16. Abdominal distention:  -improved with downward adjustment of TF/flushes  -moving bowels, actually have been loose--holding softeners, laxs  Spoke with wife reviewed chart   17.  Insomnia: increase melatonin to 5mg . Decreased trazodone to 25mg  due to oversedation in AM as per wife's request. Continue sleep chart. Sleep in general improving.   -pt was a night owl PTA, typically staying up to 0200 and waking at 1100 each day 18. Seizure activity: 90 second episode that self resolved on 6/24. Neuro consulted, appreciate recs, IV Ativan PRN, Keppra, seizure  precautions  -continue to monitor  LOS: 16 days A FACE TO FACE EVALUATION WAS PERFORMED  Meredith Staggers 08/31/2020, 9:29 AM

## 2020-08-31 NOTE — Progress Notes (Signed)
Physical Therapy Session Note  Patient Details  Name: [name] [name] MRN: [id number] Date of Birth: 1941/01/07  Today's Date: 08/31/2020 PT Individual Time: 0905-1000 PT Individual Time Calculation (min): 55 min   Short Term Goals: Week 3:  PT Short Term Goal 1 (Week 3): Patient will perform bed mobility with mod-min A of 1 person consistently. PT Short Term Goal 2 (Week 3): Patient will perform basic transfers with mod-min A of 1 person with min verbal cues. PT Short Term Goal 3 (Week 3): Patient will ambulate >100 feet with mod A and +2 w/c follow for safety consistently using LRAD. PT Short Term Goal 4 (Week 3): Patient will initiate stair training for strengthening and endurance with a functional task.  Skilled Therapeutic Interventions/Progress Updates:     Patient in bed with his wife at bedside upon PT arrival. Patient alert and agreeable to PT session. Patient demonstrated grimacing with neck movement during session, RN made aware.  Patient denied pain when asked. PT provided repositioning, rest breaks, and distraction as pain interventions throughout session.   Therapeutic Activity: Bed Mobility: Patient performed rolling R/L with min A and mod cuing for reaching and pushing with his opposite extremities as peri-care and lower body clothing management was performed with total A. He performed supine to/from sit with min A-CGA demonstrating improved initiation and sitting balance this session. Provided verbal cues and facilitation for rolling, pushing through his arms and scooting EOB. Patient initially required min A due to posterior bias, however, able to progress to supervision in static sitting with mod multimodal cues. Patient attempted to thread his lower extremities through his shorts for dynamic balance sitting EOB. Initially patient attempted to thread his leg through the leg hole from the bottom, unable to correct with max cuing, with PT holding pants on L and patient holding pants  on the R to indicate threading from the top, patient was undershooting foot placement into the pants, patient's wife held the pants with patient only placing his feet without undershooting on the R and with some undershooting and his wife compensating for him on the L. Required min A-CGA for sitting balance with retropulsion in sitting to lift lower extremities off the floor.  Transfers: Patient performed sit to/from stand x2 with mod A HHA. Provided mod-max multimodal cues for initiation sitting>standing and facilitated forward weight shift due to posterior bias in standing. Patient doffed gait belt spontaneously and began attempting to unzip his pants. Asked patient if he needed to void and he declined, but continued with perseverating on unbuttoning pants. Redirected patient to ambulating 3 steps before requiring max A to maintain balance due to distraction with his pants again. Attempted to direct patient to the bathroom, as this same behavior has been presented when the patient needs to void. Patient unable to be redirected. Provided patient with urinal with +2 assist and patient began sitting once set up to void in standing. Allowed time for patient to void in sitting with CGA for sitting balance and use of patient's R hand to reset urinal with hand-over-hand assist. Patient sat ~1 min before spontaneously initiating lying in the bed. Attempted to facilitate voiding bed level using urinal with positioning and sink water running without success until urinal was removed, then patient was incontinent of bladder in his brief.   Following toileting management bed level, patient with eyes closed and relaxed. Focused remainder of session on cervical ROM.  Manual Therapy:  -cervical rotation PROM with gentle over pressure and upper trap  stretch with facilitation at the opposite acromion 4x30 sec R/L with improved ROM from ~30 deg to ~50 deg on R and ~5 to ~20 dec on L, facilitated L rotation with inhibitory  pressure to SCM -soft tissue mobilization to SCM, scalenes, upper traps, and sub occipitals for muscle tension release -rolling to L side-lying then returning to his back while maintain head position for trunk on head rotation to promote increased L cervical rotation with prolonged hold and gentle over pressure at end range, ~45 deg, x2 min  Patient in bed with his wife at bedside and soft wrist restraints and mitts secured at end of session with breaks locked, bed alarm set, and all needs within reach.   Therapy Documentation Precautions:  Precautions Precautions: Fall Precaution Comments: cortrak Restrictions Weight Bearing Restrictions: Yes      Therapy/Group: Individual Therapy  Campbell Agramonte L Carolynne Schuchard PT, DPT  08/31/2020, 3:50 PM

## 2020-08-31 NOTE — Progress Notes (Signed)
Pharmacy: Dofetilide (Tikosyn) - Follow Up Assessment and Electrolyte Replacement  Pharmacy consulted to assist in monitoring and replacing electrolytes in this 80 y.o. male admitted on 08/15/2020 continuing dofetilide. Pharmacy consulted to keep potassium above 4 and magnesium above 2.   Labs:    Component Value Date/Time   K 4.2 08/31/2020 0410   K 4.1 11/26/2013 0000   MG 2.0 08/31/2020 0410     Plan: Potassium: K 4.2 while on 40 mEq per tube BID.  Magnesium:  Mg 2.0, on scheduled magnesium gluconate 500mg  per tube daily.  Will order magnesium sulfate 2 g IV x 1  F/u K and Mg on 7/4   Thank you for allowing [place] to participate in this patients care. Jens Som, PharmD 08/31/2020 3:26 PM  Please check [email or web address] for unit-specific pharmacy phone numbers.

## 2020-08-31 NOTE — Progress Notes (Signed)
Speech Language Pathology Daily Session Notes  Patient Details  Name: [name] [name] MRN: [id number] Date of Birth: 02/24/1941  Today's Date: 08/31/2020  Session 1: SLP Individual Time: 4235-3614 SLP Individual Time Calculation (min): 30 min  Session 2: SLP Individual Time: 4315-4008 SLP Individual Time Calculation (min): 20 min Missed Time: 10 minutes, fatigue   Short Term Goals: Week 2: SLP Short Term Goal 1 (Week 2): Pt will consume therapeutic trials of ice chips and/or small amounts of water with minimal overt s/s of aspiration and mod assist for use of swallowing precautions. SLP Short Term Goal 2 (Week 2): Pt will focus his attention to a targeted stimulus in >50% of opportunities with max assist multimodal cues. SLP Short Term Goal 3 (Week 2): Pt will maintain alertness for periods of >5 minutes to participate in basic ADLs with max assist multimodal cues. SLP Short Term Goal 4 (Week 2): Pt will use multimodal means of communication (gestures, head nod/shake, pointing, verbalizing, vocalizing) to convey his immediate needs and wants to caregivers with max assist multimodal cues.  Skilled Therapeutic Interventions:  Session 1: Skilled treatment session focused on speech and dysphagia goals. Upon arrival, patient appeared restless in bed and attempting to get up with intermittent confusion. SLP facilitated session by providing overall Min A verbal and tactile cues for arousal throughout session.  Patient performed oral care with Max verbal and tactile cues for attention to task. Patient consumed 10 trials of ice chips without overt s/s of aspiration and demonstrated what appeared to be a more timely swallow initiation. Patient also self-fed 50% of trials. Recommend patient remain NPO with plans for MSB this week. Patient's speech remains aphonic but patient more verbally expressive today with all conversational exchanges being appropriate.   Session 2: Skilled treatment session focused on  cognitive goals. Upon arrival, patient was asleep in bed. SLP facilitated session by providing Max A verbal, tactile and environmental modifications to maximize arousal. Patient unable to open his eyes with no attempts to communicate. SLP attempted to place a spoon in patient;s oral cavity without success or initiation of oral movement. Despite more than a reasonable amount of time and Max A multimodal cues, patient unable to rouse for participation. Patient left upright in bed with restraints in place and all needs within reach. Continue with current plan of care   Pain No/Denies Pain   Therapy/Group: Individual Therapy  Lilya Smitherman 08/31/2020, 3:23 PM

## 2020-09-01 ENCOUNTER — Inpatient Hospital Stay (HOSPITAL_COMMUNITY): Payer: Medicare Other

## 2020-09-01 ENCOUNTER — Telehealth (HOSPITAL_COMMUNITY): Payer: Self-pay | Admitting: *Deleted

## 2020-09-01 LAB — GLUCOSE, CAPILLARY
Glucose-Capillary: 112 mg/dL — ABNORMAL HIGH (ref 70–99)
Glucose-Capillary: 137 mg/dL — ABNORMAL HIGH (ref 70–99)
Glucose-Capillary: 144 mg/dL — ABNORMAL HIGH (ref 70–99)
Glucose-Capillary: 151 mg/dL — ABNORMAL HIGH (ref 70–99)
Glucose-Capillary: 163 mg/dL — ABNORMAL HIGH (ref 70–99)
Glucose-Capillary: 168 mg/dL — ABNORMAL HIGH (ref 70–99)

## 2020-09-01 MED ORDER — SULFAMETHOXAZOLE-TRIMETHOPRIM 800-160 MG PO TABS: 1.0000 | ORAL_TABLET | Freq: Two times a day (BID) | ORAL | Status: DC

## 2020-09-01 MED ORDER — CEFDINIR 250 MG/5ML PO SUSR
300.0000 mg | Freq: Two times a day (BID) | ORAL | Status: DC
  Administered 2020-09-01 – 2020-09-02 (×3): 300 mg
  Filled 2020-09-01 (×3): qty 6

## 2020-09-01 MED ORDER — CEFDINIR 300 MG PO CAPS: 300.0000 mg | ORAL_CAPSULE | Freq: Two times a day (BID) | ORAL | Status: DC

## 2020-09-01 NOTE — Progress Notes (Signed)
Patient ID: [name] [name], male   DOB: [birth year], 80 y.o.   MRN: [id number]  SW met with pt in room. Pt was sleeping at time of visit. Pt wife [name] [name] present. SW provided patient with updates from team conference. SW also shared that pending his care needs, there is always the option for SNF placement at [facility]. Wife is taking it day by day. SW will continue to follow-up with any updates.   Loralee Pacas, MSW, [place] Office: [phone] Cell: [phone] Fax: [phone]

## 2020-09-01 NOTE — Progress Notes (Addendum)
PROGRESS NOTE   Subjective/Complaints: Restless again last night. Sleep chart indicates some sleep. Pt napped quite a bit during the day. Pt had 2 loose stools with PT this morning  ROS: Limited due to cognitive/behavioral     Objective:   No results found. Recent Labs    08/31/20 0410  WBC 13.0*  HGB 11.6*  HCT 36.9*  PLT 369     Recent Labs    08/31/20 0410  NA 139  K 4.2  CL 105  CO2 28  GLUCOSE 189*  BUN 19  CREATININE 0.84  CALCIUM 8.8*    Intake/Output Summary (Last 24 hours) at 09/01/2020 0349 Last data filed at 09/01/2020 0500 Gross per 24 hour  Intake --  Output 200 ml  Net -200 ml        Physical Exam: Vital Signs Blood pressure 126/84, pulse 81, temperature 98.4 F (36.9 C), temperature source Oral, resp. rate 17, height 5\' 7"  (1.702 m), weight 86 kg, SpO2 98 %. Constitutional: No distress . Vital signs reviewed. HEENT: EOMI, oral membranes moist, NGT Neck: supple Cardiovascular: RRR without murmur. No JVD    Respiratory/Chest: CTA Bilaterally without wheezes or rales. Normal effort    GI/Abdomen: BS +, non-tender, non-distended Ext: no clubbing, cyanosis, or edema Psych: awake, restless, distracted Musculoskeletal:    Cervical back: no axial or limb pain     Skin:    Comments: Some ecchymoses on arms B/L, wounds closed. Neurological: right gaze preference, delayed.moving all 4 spontaneously but more purposeful movement on right.  Whispers generally. Did answer appropriately once for me.      Assessment/Plan: 1. Functional deficits which require 3+ hours per day of interdisciplinary therapy in a comprehensive inpatient rehab setting. Physiatrist is providing close team supervision and 24 hour management of active medical problems listed below. Physiatrist and rehab team continue to assess barriers to discharge/monitor patient progress toward functional and medical goals  Care  Tool:  Bathing    Body parts bathed by patient: Chest, Abdomen, Front perineal area, Face, Right arm, Left arm   Body parts bathed by helper: Buttocks, Left upper leg, Right upper leg, Right lower leg, Left lower leg     Bathing assist Assist Level: Moderate Assistance - Patient 50 - 74%     Upper Body Dressing/Undressing Upper body dressing   What is the patient wearing?: Pull over shirt    Upper body assist Assist Level: Maximal Assistance - Patient 25 - 49%    Lower Body Dressing/Undressing Lower body dressing      What is the patient wearing?: Pants, Incontinence brief     Lower body assist Assist for lower body dressing: Total Assistance - Patient < 25%     Toileting Toileting    Toileting assist Assist for toileting: Dependent - Patient 0%     Transfers Chair/bed transfer  Transfers assist     Chair/bed transfer assist level: Moderate Assistance - Patient 50 - 74%     Locomotion Ambulation   Ambulation assist   Ambulation activity did not occur: Safety/medical concerns  Assist level: Maximal Assistance - Patient 25 - 49% Assistive device: Walker-rolling Max distance: 50 ft   Walk  10 feet activity   Assist  Walk 10 feet activity did not occur: Safety/medical concerns  Assist level: Maximal Assistance - Patient 25 - 49% Assistive device: Walker-rolling   Walk 50 feet activity   Assist Walk 50 feet with 2 turns activity did not occur: Safety/medical concerns  Assist level: Maximal Assistance - Patient 25 - 49% Assistive device: Walker-rolling    Walk 150 feet activity   Assist Walk 150 feet activity did not occur: Safety/medical concerns         Walk 10 feet on uneven surface  activity   Assist Walk 10 feet on uneven surfaces activity did not occur: Safety/medical concerns         Wheelchair     Assist     Wheelchair activity did not occur: Safety/medical concerns         Wheelchair 50 feet with 2 turns  activity    Assist    Wheelchair 50 feet with 2 turns activity did not occur: Safety/medical concerns       Wheelchair 150 feet activity     Assist  Wheelchair 150 feet activity did not occur: Safety/medical concerns       Blood pressure 126/84, pulse 81, temperature 98.4 F (36.9 C), temperature source Oral, resp. rate 17, height 5\' 7"  (1.702 m), weight 86 kg, SpO2 98 %.  Medical Problem List and Plan: 1.  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  Impaired mobility -DVT/anticoagulation:  Eliquis restarted 6/21-pharmacy indicates increased cost to patient. More economical option is xarelto. Asked pharmacy to review with Mrs. Wenger. She will want to speak with cardiology as well.              -antiplatelet therapy: Continue ASA 325 mg/day 3. Pain Management: N/A 4. Mood: LCSW to follow for evaluation and support.              -antipsychotic agents: N/A 5. Neuropsych: This patient is not capable of making decisions on his own behalf. 6. Skin/Wound Care: Routine pressure relief measures.  7. Fluids/Electrolytes/Nutrition: NPO with tube feeds.             -continue reduced water flushes to 100 cc every 4 hours -Na 139 6/27 8. HTN: Monitor BP tid with SBP goal <140/90.              --continue losartan/day, cardura/day and Metoprolol every 6 hours Vitals:   08/31/20 2111 09/01/20 0407  BP: 131/89 126/84  Pulse: 89 81  Resp: 17 17  Temp:  98.4 F (36.9 C)  SpO2:  98%   Controlled 6/28  9. T2DM: Hgb A1C- 6.2. well controlled 6/26- continue to monitor. CBG (last 3)  Recent Labs    08/31/20 2354 09/01/20 0350 09/01/20 0759  GLUCAP 141* 163* 144*    10. Lethargy: seems to wax and wane a bit which is expected given his CT scan  -d/c ritalin given  possible seizure activity, continue 200mg  amantadine -suspect UTI   11. PAF: Monitor HR tid--eliquis restarted --SDH post fall 07/27/20             --continue Tikosyn bid             --  appreciate pharmacy help to keep K+>4.0/Mg levels  . 12. Thoracic aortic aneurysm: Followed by [facility]. 13. Induced hypernatremia: Improving with increase water flushes  Stable on 6/24, repeat Monday.  14. Hypokalemia: increase repletion to 40meq BID and replete tomorrow.   15. UTI: completed kelfex course 6/26.  -wbc's 13k again 6/26, UA+  -6/28 begin empiric cefedinir  16. Abdominal distention: better  -I downward adjustment of TF/flushes  -dc miralax, magnesium not helping either  Spoke with wife reviewed chart   17.  Insomnia: increase melatonin to 5mg . Decreased trazodone to 25mg  due to oversedation in AM as per wife's request. Continue sleep chart. Sleep in general improving.   -pt was a night owl PTA, typically staying up to 0200 and waking at 1100 each day  -UTI playing a role in generalized confusion/neurosedation  -keppra also not helping with mental status 18. Seizure activity: 90 second episode that self resolved on 6/24. Neuro consulted, appreciate recs, IV Ativan PRN, Keppra, seizure precautions  -none since keppra started 19. Dysphagia: pt remains NPO, NGT inplace   LOS: 17 days A FACE TO FACE EVALUATION WAS PERFORMED  Meredith Staggers 09/01/2020, 9:23 AM

## 2020-09-01 NOTE — Progress Notes (Signed)
PA Pam notified of pt cortrak placement. Received verbal orders to continue with meds and tube feeds. Marking at 68cm. Assigned nurse notified.

## 2020-09-01 NOTE — Progress Notes (Signed)
Nutrition Follow-up  DOCUMENTATION CODES:   Not applicable  INTERVENTION:  Continue Jevity 1.5 cal formula via Cortrak NGT at goal rate of 65 ml/hr x 20 hours (may hold TF for up to 4 hours for therapy).   Continue 45 ml Prosource TF TID per tube.   Free water flushes of 100 ml q 4 hours per tube (MD to adjust as appropriate).   Tube feeding regimen provides 2070 kcal, 116 grams of protein, 1588 ml free water.   NUTRITION DIAGNOSIS:   Inadequate oral intake related to inability to eat as evidenced by NPO status; ongoing  GOAL:   Patient will meet greater than or equal to 90% of their needs; met with TF  MONITOR:   TF tolerance, Diet advancement, Labs, Weight trends, I & O's  REASON FOR ASSESSMENT:   New TF    ASSESSMENT:   80 yo male with a PMH of diastolic CHF, AVR/MVR, A-fib on coumadin/Tiksoyn who was admitted on 5/23 for difficulty speaking and R sided weakness after a fall the day before. 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 He was intubated for airway protection and family elected on DNR with comfort care due to poor prognosis.  However, patient started showing ability to follow commands on 5/24 with repeat CT showing persistent bilateral SDH.  He was taken to the OR for right craniotomy for evacuation of SDH. He tolerated extubation without difficulty but was limited by left greater than right-sided weakness with visual deficits, strong posterior bias as well as poor safety awareness with cognitive deficits.  He was admitted to rehab on 5/27 for intensive rehab program to consist of PT OT and speech therapy.  Pt continues on NPO status. Pt has been tolerating his tube feeds via Cortrak NGT. Plans to continue with current tube feeding orders. Labs and medications reviewed.   Diet Order:   Diet Order             Diet NPO time specified  Diet effective now                   EDUCATION NEEDS:   Education needs have been  addressed  Skin:  Skin Assessment: Reviewed RN Assessment  Last BM:  6/28  Height:   Ht Readings from Last 1 Encounters:  08/17/20 _0  (1.702 m)    Weight:   Wt Readings from Last 1 Encounters:  08/20/20 86 kg    Ideal Body Weight:  67.3 kg  BMI:  Body mass index is 29.69 kg/m.  Estimated Nutritional Needs:   Kcal:  2000-2200  Protein:  115-130 grams  Fluid:  >2 L  Corrin Parker, MS, RD, LDN RD pager number/after hours weekend pager number on Amion.

## 2020-09-01 NOTE — Telephone Encounter (Signed)
Pts wife left vm requesting a call from [place] about switching to eliquis. Pt is in currently in inpatient rehab. Called wife to inform her hat Dr.McLean is out of the country until next week. No answer.

## 2020-09-01 NOTE — Progress Notes (Addendum)
Patient remains restless and agitated. Slept intermittently for about 3hrs (please refer sleep chart). Had a large bowel movement.. Remains incontinent of bowel and bladder. Continuous tube feed ongoing.Soft wrist restraint intact. Protocol followed. Capillary blood sugar checks monitored and corrected per sliding scale. Safety maintained at all times.

## 2020-09-01 NOTE — Progress Notes (Signed)
Physical Therapy Session Note  Patient Details  Name: [name] [name] MRN: [id number] Date of Birth: 1940/05/24  Today's Date: 09/01/2020 PT Individual Time: 0800-0905 PT Individual Time Calculation (min): 60 min   Short Term Goals: Week 3:  PT Short Term Goal 1 (Week 3): Patient will perform bed mobility with mod-min A of 1 person consistently. PT Short Term Goal 2 (Week 3): Patient will perform basic transfers with mod-min A of 1 person with min verbal cues. PT Short Term Goal 3 (Week 3): Patient will ambulate >100 feet with mod A and +2 w/c follow for safety consistently using LRAD. PT Short Term Goal 4 (Week 3): Patient will initiate stair training for strengthening and endurance with a functional task.  Skilled Therapeutic Interventions/Progress Updates:     Patient in bed with his wife at bedside upon PT arrival. Patient alert throughout session without signs of pain and denied pain during session.   Patient with significant challenge with focused attention and following simple commands today. Mobility mostly self-directed by patient with familiar tasks.   Patient found to be incontinent of bowl and bladder at beginning of session.   Therapeutic Activity: Bed Mobility: Performed rolling R/L with min-mod A +2 for time management during peri-care and changing incontinence brief with max A, patient able to wipe between his legs with follow-up for thoroughness and hand over hand to initiate. Performed supine to sit with min A for trunk support and to facilitate initiation of lower extremities. Performed sit to supine with mod-max A +2 with SLP due to patient being resistive to lying down initially, rested calmly in supine after lying down. Patient performed sitting balance EOB with min A progressing to CGA-supervision requiring assist more for waiting to stand rather than posterior lean today. Donned shorts with total A sitting EOB with assist from his wife for pulling pants up in  standing. Transfers: Patient performed sit to/from stand form the hospital bed, TIS w/c, and BSC over toilet with min A using RW. Provided min verbal cues to initiate standing as patient was restless and eager to stand throughout session and required max multimodal cues to remain sitting for safety. Patient was incontinent of bladder and bowl prior to toileting during session. Required total A for peri-care and lower body clothing management while attempting toileting.   Attempted to initiate gait training in the hallway with patient's wife bringing w/c behind, patient perseverative of turning around to see his wife. Patient's wife moved in front of him for a visual target and patient ambulated forward with mod A for maintaining RW straight ahead and he continues to pull it to the R with poor sustained attention on his wife today. Eventually, he no longer responded to cues to look at his wife and began to turn around to the R and navigated into the ADL apartment, he did go in the apartment yesterday with OT. Cued patient to walk to the ADL recliner across the room, patient distracted by chest of drawers and self selelcted to open and look through the top 2 drawers. Patient then began to turn to the kitchen, but stopped to start unbuttoning his pants, previously indicating he needed to void. Attempted to redirect patient to the ADL bathroom for toileting. Patient pushed against therapist to continue progressing to the kitchen and stopped once near the sink and began removing his pants again. Patient successful at unbuttoning his pants and moving his brief out of the way. +2 assist to close door and place trash  can in front of him to void as no other alternative available and patient insistent on standing to void at this time. Patient unsuccessful with voiding and allowed PT to pull up and secure his brief and pants with total A and returned to sitting in TIS w/c with mod A.   Transported patient into the ADL  bathroom and performed toilet transfer as above with mod A and max cues to facilitate turning. Patient was incontinent of urin and bowl prior to transferring to the toilet. Required mod A to remain seated as PT and therapy tech performed clothing management and peri-care, as above. Patient transferred back to the TIS w/c with mod A and returned to the room with seat belt and gait belt donned for hip positioning prior to handing off to [place].   Returned to assist SLP with [facility] transfer back to bed with min A to stand and max cues to remain seated during transfer.   Therapy Documentation Precautions:  Precautions Precautions: Fall Precaution Comments: cortrak Restrictions Weight Bearing Restrictions: Yes   Therapy/Group: Individual Therapy  Liba Hulsey L Elinda Bunten PT, DPT  09/01/2020, 12:31 PM

## 2020-09-01 NOTE — Progress Notes (Signed)
Occupational Therapy Session Note  Patient Details  Name: [name] [name] MRN: [id number] Date of Birth: 1941-01-10  Today's Date: 09/01/2020 OT Individual Time: 1240-1304 OT Individual Time Calculation (min): 24 min   Session 2: OT Individual Time: 1345-1430 OT Individual Time Calculation (min): 45 min    Short Term Goals: Week 3:  OT Short Term Goal 1 (Week 3): Pt will complete stand pivot transfer with mod A OT Short Term Goal 2 (Week 3): Pt will complete UB bathing with min A OT Short Term Goal 3 (Week 3): Pt will sequence donning shirt with no more than mod cueing and mod A  Skilled Therapeutic Interventions/Progress Updates:    Session 1: Pt received supine, sleeping soundly. He was awoken and once bed mobility was initiated he became much more alert. Pt had improved following of commands this session overall despite internal/external distraction. He completed sit >stand from EOB with min A. Min A ambulatory transfer to the sink with mod cueing. He required max facilitation/assist to sit down in TIS w/c. He required min A for oral care. Pt completed stand pivot transfer back to the bed with mod A. Pt passed off to SLP in room.    Session 2: Pt received supine, again sleeping soundly. He required max+2 for bed mobility to EOB. He was able to maintain sitting balance with CGA once he stabilized. Pt completed sit > stand with mod A and ambulatory transfer to the sink with mod +2 assist. Pt extremely distracted this session and constantly pulling at NG tube. Alerted RN to possible NG tube dislodging. Pt was assisted to [facility] d/t bowel incontinence and required max for clothing management and hygiene. Pt required max cueing for UB bathing and dressing. Pt completed 15 ft of functional mobility, attempting to illicit automatic motor pattern/reciprocal stepping, pt just incredibly distracted this session and requiring +2 intervention to keep himself and staff safe from poor safety awareness and  frequent movement. Pt returned to supine and was left with all needs met, restraints on. Bed alarm set.    Therapy Documentation Precautions:  Precautions Precautions: Fall Precaution Comments: cortrak Restrictions Weight Bearing Restrictions: Yes  Therapy/Group: Individual Therapy  Curtis Sites 09/01/2020, 6:24 AM

## 2020-09-01 NOTE — Progress Notes (Signed)
Speech Language Pathology Weekly Progress and Session Note  Patient Details  Name: EBRIMA RANTA MRN: [id number] Date of Birth: Jul 29, 1940  Beginning of progress report period: August 24, 2020 End of progress report period: September 01, 2020  Today's Date: 09/01/2020 SLP Individual Time: 6010-9323 SLP Individual Time Calculation (min): 35 min  Short Term Goals: Week 2: SLP Short Term Goal 1 (Week 2): Pt will consume therapeutic trials of ice chips and/or small amounts of water with minimal overt s/s of aspiration and mod assist for use of swallowing precautions. SLP Short Term Goal 1 - Progress (Week 2): Met SLP Short Term Goal 2 (Week 2): Pt will focus his attention to a targeted stimulus in >50% of opportunities with max assist multimodal cues. SLP Short Term Goal 2 - Progress (Week 2): Met SLP Short Term Goal 3 (Week 2): Pt will maintain alertness for periods of >5 minutes to participate in basic ADLs with max assist multimodal cues. SLP Short Term Goal 3 - Progress (Week 2): Not met SLP Short Term Goal 4 (Week 2): Pt will use multimodal means of communication (gestures, head nod/shake, pointing, verbalizing, vocalizing) to convey his immediate needs and wants to caregivers with max assist multimodal cues. SLP Short Term Goal 4 - Progress (Week 2): Met    New Short Term Goals: Week 3: SLP Short Term Goal 1 (Week 3): Pt will consume therapeutic trials of ice chips and/or small amounts of water with minimal overt s/s of aspiration and min assist for use of swallowing precautions. SLP Short Term Goal 2 (Week 3): Pt will maintain alertness for periods of 2 minutes to participate in basic tasks with max assist multimodal cues consistently over 3 consecutive sessions. SLP Short Term Goal 3 (Week 3): Pt will use multimodal means of communication (gestures, head nod/shake, pointing, verbalizing, vocalizing) to convey his immediate needs and wants to caregivers with mod assist multimodal cues. SLP  Short Term Goal 4 (Week 3): Patient will utilize an increased vocal intensity at the phrase level to achieve ~75% intelligibility with Max verbal cues. SLP Short Term Goal 5 (Week 3): Patient will initiate functional tasks with Max A multimodal cues.  Weekly Progress Updates: Patient has made functional but inconsistent gains and has met 3 of 4 STGs this reporting period. SLP has been attempting to see patient twice a day in order to maximize arousal, attention and overall meaningful participation. Patient appears to demonstrate increased attention and participation in the morning sessions vs the afternoon sessions. Currently, patient remains NPO but demonstrates increased oral manipulation to boluses and improved timeliness in swallow initiation. Plan for MBS this week. 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 Patient can express his wants/needs at the word and phrase level but intelligibility is reduced due to aphonia. Patient's overall barrier to progress continues to be fluctuating arousal and attention during functional tasks. Overall, patient continues to require overall Max A multimodal cues for arousal, focused attention and initiation of tasks with ongoing restlessness noted. Patient and family education ongoing. Patient would benefit from continued skilled SLP intervention to maximize his cognitive, speech and swallowing function prior to discharge.      Intensity: Minumum of 1-2 x/day, 30 to 90 minutes Frequency: 3 to 5 out of 7 days Duration/Length of Stay: 09/14/20 Treatment/Interventions: Cognitive remediation/compensation;Cueing hierarchy;Dysphagia/aspiration precaution training;Internal/external aids;Speech/Language facilitation;Environmental controls;Multimodal communication approach;Patient/family education;Therapeutic Activities   Daily Session  Skilled Therapeutic Interventions:   Skilled treatment session  focused  on dysphagia and cognitive goals. Upon arrival, patient was upright in the wheelchair and was extremely restless and distractible. Patient constantly attempting to reposition himself in the chair and grasping for anything within his reach. Patient placed in the middle of the room with a table in front of him for contextual cues and to limit unsafe reaching behaviors. Patient performed oral care via the suction toothbrush with cues needed for thoroughness. Patient consumed trials of ice chips with appropriate oral manipulation and without overt s/s of aspiration. Recommend MBS tomorrow to assess swallow function. SLP had to feed patient ~75% of trials due to poor attention. Patient was transferred back to bed via the stedy with Max A multimodal cues needed and +2 assist for safety. Patient left upright in bed with alarm on, restraints in place and wife present. Continue with current plan of care.     Pain No/Denies Pain   Therapy/Group: Individual Therapy  Lillias Difrancesco 09/01/2020, 9:53 AM

## 2020-09-01 NOTE — Progress Notes (Signed)
Speech Language Pathology Daily Session Note  Patient Details  Name: [name] [name] MRN: [id number] Date of Birth: Sep 27, 1940  Today's Date: 09/01/2020 SLP Individual Time: 1304-1330 SLP Individual Time Calculation (min): 26 min  Short Term Goals: Week 3: SLP Short Term Goal 1 (Week 3): Pt will consume therapeutic trials of ice chips and/or small amounts of water with minimal overt s/s of aspiration and min assist for use of swallowing precautions. SLP Short Term Goal 2 (Week 3): Pt will maintain alertness for periods of 2 minutes to participate in basic tasks with max assist multimodal cues consistently over 3 consecutive sessions. SLP Short Term Goal 3 (Week 3): Pt will use multimodal means of communication (gestures, head nod/shake, pointing, verbalizing, vocalizing) to convey his immediate needs and wants to caregivers with mod assist multimodal cues. SLP Short Term Goal 4 (Week 3): Patient will utilize an increased vocal intensity at the phrase level to achieve ~75% intelligibility with Max verbal cues. SLP Short Term Goal 5 (Week 3): Patient will initiate functional tasks with Max A multimodal cues.  Skilled Therapeutic Interventions: Skilled treatment session focused on cognitive and dysphagia goals. Patient appeared lethargic throughout session and required Max A multimodal cues for focused attention to tasks as he was having difficulty terminating motor planning tasks (using his finger as a toothbrush, pretending to drink from a cup, etc). Patient also restless and pulling at NG tube throughout session requiring his wife's assistance for redirection. Patient consumed trials of ice chips and small teaspoons of water without overt s/s of aspiration. MBS is scheduled for tomorrow. Patient left upright in bed with wife present and restraints in place. Continue with current plan of care.      Pain Pain Assessment Pain Scale: Faces Pain Score: 0-No pain Faces Pain Scale: No  hurt  Therapy/Group: Individual Therapy  Yavuz Kirby, [place] 09/01/2020, 3:17 PM

## 2020-09-01 NOTE — Patient Care Conference (Signed)
Inpatient RehabilitationTeam Conference and Plan of Care Update Date: 09/01/2020   Time: 10:01 AM    Patient Name: [name] [name]      Medical Record Number: [id number]  Date of Birth: Jun 21, 1940 Sex: Male         Room/Bed: 4M01C/4M01C-01 Payor Info: Payor: MEDICARE / Plan: MEDICARE PART A AND B / Product Type: *No Product type* /    Admit Date/Time:  08/15/2020  2:18 PM  Primary Diagnosis:  Acute ischemic right MCA stroke [facility])  Hospital Problems: Principal Problem:   Acute ischemic right MCA stroke [facility]) Active Problems:   Atrial fibrillation, chronic ([place])   Oropharyngeal dysphagia   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)    Expected Discharge Date: Expected Discharge Date: 09/14/20  Team Members Present: Physician leading conference: Dr. Alger Simons Care Coodinator Present: Loralee Pacas, LCSWA;Marisol Giambra Creig Hines, RN, BSN, CRRN Nurse Present: [name], RN PT Present: Apolinar Junes, PT OT Present: Laverle Hobby, OT SLP Present: Weston Anna, SLP PPS Coordinator present : Gunnar Fusi, SLP     Current Status/Progress Goal Weekly Team Focus  Bowel/Bladder   Incontinent with Bladder and Bowel. Last BM 09/01/2020 ( LARGE, BROWN and FORMED).  Regain Continence (Volitional).  Assess Q Shift.   Swallow/Nutrition/ Hydration   NPO nwith NG tube  Min A  trials of ice chips and MBS this week   ADL's   When alert and more attentive to task he is more consistent at following commands, mod A UB ADLs, max A LB ADLs, internally and externally distracted  min A overall  ADLs, cognitive retraining, transfers, initiation, attention to task, family edu   Mobility   Continues to require mod-min A overall with intermittent max A and/or +2 assist, improved initiation and command following this week, continues to be limited by attention and motor planning  Min A overall  Attention, motor planning, functional mobility, activity  tolerance, balance, midline orientation, L attention, gait traning, cervical ROM, patient/caregiver education   Communication   Mod-Max A  Min A  initiation of verbal expression, increased vocal intensity   Safety/Cognition/ Behavioral Observations  Max A  Mod A  arousal, attention, initiation   Pain   Denies Pain.  Remain Pain Free.  Assess Q Shift.   Skin   Surgical Incision and Bruising Lt Hand.  Skin Intergrity Intact.  Assess Q Shift.     Discharge Planning:      Team Discussion: Issues with sleep, another UTI, had seizure over the weekend, treated with Keppra. Incontinent B/B most times. Some pink areas to bottom, bruising to bilateral wrist restraints due to patient pulling against restraints. Restraints in place to keep patient from pulling Cortrak tube out. It is providing his nutrition. He will likely be placement at [facility]. Patient on target to meet rehab goals: When alert he is good. Lower body dressing max assist, upper body dressing min to mod assist. Starting conversations with his wife about how much care he will be at discharge. He has min assist goals. Functionally making good progress. Not good focused attention today. Mobility min assist, ambulation > 100 ft when able to attend to it. Very easily distracted. SLP has been automatic with trials, MBS tomorrow. May start diet, super restless and distracted today.  *See Care Plan and progress notes for long and short-term goals.   Revisions to Treatment Plan:  MD making medication adjustments.  Teaching Needs: Family education, medication management, pain management,  skin/wound care, transfer training, gait training, balance training, endurance training, safety awareness.  Current Barriers to Discharge: Decreased caregiver support, Medical stability, Home enviroment access/layout, Incontinence, Lack of/limited family support, Weight, Medication compliance, Behavior, and Nutritional means  Possible Resolutions to  Barriers: Continue current medications, provide emotional support.     Medical Summary Current Status: slow neuro progress. seizure over weekend. now on keppra. new UTI--rx empirically, sleep inconsistent--adjustments to regimen have been made  Barriers to Discharge: Medical stability   Possible Resolutions to [employer] Focus: Rx UTI, maximize sleep/wake. adjust med regimen   Continued Need for Acute Rehabilitation Level of Care: The patient requires daily medical management by a physician with specialized training in physical medicine and rehabilitation for the following reasons: Direction of a multidisciplinary physical rehabilitation program to maximize functional independence : Yes Medical management of patient stability for increased activity during participation in an intensive rehabilitation regime.: Yes Analysis of laboratory values and/or radiology reports with any subsequent need for medication adjustment and/or medical intervention. : Yes   I attest that I was present, lead the team conference, and concur with the assessment and plan of the team.   Cristi Loron 09/01/2020, 2:38 PM

## 2020-09-02 ENCOUNTER — Inpatient Hospital Stay (HOSPITAL_COMMUNITY): Payer: Medicare Other

## 2020-09-02 DIAGNOSIS — A499 Bacterial infection, unspecified: Secondary | ICD-10-CM

## 2020-09-02 DIAGNOSIS — N390 Urinary tract infection, site not specified: Secondary | ICD-10-CM

## 2020-09-02 LAB — GLUCOSE, CAPILLARY
Glucose-Capillary: 119 mg/dL — ABNORMAL HIGH (ref 70–99)
Glucose-Capillary: 136 mg/dL — ABNORMAL HIGH (ref 70–99)
Glucose-Capillary: 141 mg/dL — ABNORMAL HIGH (ref 70–99)
Glucose-Capillary: 151 mg/dL — ABNORMAL HIGH (ref 70–99)
Glucose-Capillary: 157 mg/dL — ABNORMAL HIGH (ref 70–99)
Glucose-Capillary: 161 mg/dL — ABNORMAL HIGH (ref 70–99)

## 2020-09-02 MED ORDER — LEVOFLOXACIN 25 MG/ML PO SOLN
750.0000 mg | Freq: Every day | ORAL | Status: AC
  Administered 2020-09-02 – 2020-09-08 (×7): 750 mg
  Filled 2020-09-02 (×8): qty 30

## 2020-09-02 MED ORDER — TRAZODONE HCL 50 MG PO TABS
50.0000 mg | ORAL_TABLET | Freq: Every day | ORAL | Status: DC
  Administered 2020-09-02 – 2020-09-03 (×2): 50 mg via NASOGASTRIC
  Filled 2020-09-02 (×2): qty 1

## 2020-09-02 NOTE — Progress Notes (Signed)
Patient restless throughout the night. Slept for 2 hours. Incontinent care provided as well. Pt requiring coverage for scheduled blood sugars.

## 2020-09-02 NOTE — Progress Notes (Signed)
Physical Therapy Session Note  Patient Details  Name: [name] [name] MRN: [id number] Date of Birth: 18-Mar-1940  Today's Date: 09/02/2020 PT Individual Time: 0848-0900, 0930-1000, 1450-1540 PT Individual Time Calculation (min): 30 min   Short Term Goals: Week 3:  PT Short Term Goal 1 (Week 3): Patient will perform bed mobility with mod-min A of 1 person consistently. PT Short Term Goal 2 (Week 3): Patient will perform basic transfers with mod-min A of 1 person with min verbal cues. PT Short Term Goal 3 (Week 3): Patient will ambulate >100 feet with mod A and +2 w/c follow for safety consistently using LRAD. PT Short Term Goal 4 (Week 3): Patient will initiate stair training for strengthening and endurance with a functional task.  Skilled Therapeutic Interventions/Progress Updates:     Session 1&2:  Patient in bed upon PT arrival. Patient alert throughout session, did not display any outward signs of pain and denied pain verbally during sessions.  Focused sessions on patient transporting patient and positioning for safety during MBS with [name] SLP.   Therapeutic Activity: Bed Mobility: Donned pants with total A bed level with patient rolling R/L with mod A. Patient performed supine to sit with max A without provided time to self-initiate and returned to lying when back in the room with supervision when provided time to initiate. Provided min verbal cues for initiation and sequencing. Transfers: Patient performed stand pivot from bed>TIS w/c and TIS w/c<>MBS chair (straight back seat without arm rests) with mod-max A without providing time for patient to self-initiate. Provided mod multimodal cues for initiation and turning. Patient performed scooting back in the w/c and seat x2 with min A and increased time to initiate. Patient returned TIS w/c>bed with min A with increased time to initiate and multimodal cues to reach for the bed rail to promote forward weight shift and balance during  transfer. Required 3 attempts as PT provided less hands on assist, having the patient problem solve performing the transfer more independently.   Patient was transported with total A to/from radiology for safety and time/energy conservation. Positioned patient in the MBS seat with min A and applied gait belt low around his hips to prevent hip extension and sliding forward during testing. Foot stool placed in font of foot plate to provide improved foot support in sitting. Patient remained calm and positioned well for 30 min throughout MBS with SLP, see SLP note for patient performance.   Manual Therapy: -cervical rotation PROM with gentle over pressure and upper trap stretch with facilitation at the opposite acromion 2x1 min R/L with improved ROM from ~30 deg to ~50 deg on R and ~10 to ~30 dec on L, facilitated L rotation with inhibitory pressure to SCM -soft tissue mobilization to SCM, scalenes, upper traps, and sub occipitals for muscle tension release  Patient resting calmly in bed with B soft wrist restraints secured with his wife at bedside at end of sessions with breaks locked, bed alarm set, and all needs within reach. Patient's wife educated on monitoring patient for need of hand mitts if he becomes restless and to alert nursing staff.   Session 3: Patient asleep in bed with his wife at bedside upon PT arrival. Patient aroused to verbal and tactile stimulation throughout session due to mild lethargy with eyes closing at rest. Focused session on going outside for therapeutic emotional support, assessment of sitting tolerance, and tolerance in a busy, novel environment.   Therapeutic Activity: Bed Mobility: Patient threaded his leg through his  pants lying in the bed with mod A for positioning pants and L foot placement due to undershooting. Patient performed supine to sit with mod A and max cues to initiate due to patient reluctance to get OOB, then min A to sit up once patient self initiated  pushing up. Patient performed sit to supine with supervision with min verbal cues and max A +2 for scooting up in the bed.  Transfers: Patient performed stand pivot transfer bed<>TIS w/c with min A with cues for reaching for w/c arm rest or bed rail, as above. Provided min cues for standing to pull his pants up with mod/max A from a second person during initial transfer and hand-over-hand assist to reach for the opposite arm rest to initiate pivot. Second transfer focused on patient initiating movement without facilitation x3 attempts, patient unable to motor plan past partial stand with B hands on arm rests x2, cued patient to reach for bed rail with improved stand with CGA, but patient unable to motor plan pivot to the L and returned to sitting. Final attempt, PT provided min A and facilitation as above to manage patient's frustration tolerance and fatigue.   Patient sat outside at the [facility] entrance x20 min. Patient was restless with B lower extremities off the leg rests during transport, but calmly sat tilted in the TIS w/c with legs crossed and propped on one leg rest. Patient disoriented to place, stating this was [facility], but oriented to the year, selecting 2022 from 3 choices. Provided orientation to patient, patient did not respond or react, remained calm in the chair. Sat patient upright and removed leg rests, patient declined standing, nodding yes when asked if her was fatigued. Attempted to engage patient in naming task, however, patient distracted by Cortrak. PT provided nasal care due to build-up with some success. Patient required redirection to not pull Cortrak x4. Overall, patient tolerated outdoor session well demonstrating appropriate behaviors and fair focused attention and intermittent sustained attention in a novel environment .   Patient in bed with B soft wrist restraints secured and his wife at bedside at end of session with breaks locked, bed alarm set, and all  needs within reach.   Therapy Documentation Precautions:  Precautions Precautions: Fall Precaution Comments: cortrak Restrictions Weight Bearing Restrictions: Yes    Therapy/Group: Individual Therapy  Shawntell Dixson L Brylee Berk PT, DPT  09/02/2020, 10:08 PM

## 2020-09-02 NOTE — Progress Notes (Signed)
Occupational Therapy TBI Note  Patient Details  Name: [name] [name] MRN: [id number] Date of Birth: 1940-10-29  Today's Date: 09/02/2020 OT Individual Time: 1100-1200 OT Individual Time Calculation (min): 60 min    Short Term Goals: Week 2:  OT Short Term Goal 1 (Week 2): Pt will bathe UB with mod A OT Short Term Goal 1 - Progress (Week 2): Met OT Short Term Goal 2 (Week 2): Pt will initiate 50% of commands during bathing with mod cueing OT Short Term Goal 2 - Progress (Week 2): Met OT Short Term Goal 3 (Week 2): Pt will complete sit > stand with max A +1 OT Short Term Goal 3 - Progress (Week 2): Met  Skilled Therapeutic Interventions/Progress Updates:    1:1. Pt received in bed asleep with wife present. No indication of pain. Pt completes ambulatory transfer to/from bed with MIN-MOD A sit to stand but MOD-MAX A for steering walker as pt attempting to walk in circles. Pt able ot doff socks, however when OT puts new socks over toes pt perseverative on doffing requiring total A to don socks and shoes with shoe horn. Pt completes short  distance functional mobility with RW in dayroom with initially pt perseverative on going onto locked charting room requiring max facilitation and increased time to processing stand>sit. During another bout of mobility pt stops and pulls pants down attempting to pee when walking past bathroom. Redirected pt into bathroom, however pt already voided into bladder. Exited session with pt seated in bed, exit alarm on and call light in reach  Therapy Documentation Precautions:  Precautions Precautions: Fall Precaution Comments: cortrak Restrictions Weight Bearing Restrictions: Yes General:   Vital Signs:  Pain: Pain Assessment Pain Scale: Faces Pain Score: 0-No pain Agitated Behavior Scale: TBI  Observation Details Observation Environment: CIR Start of observation period - Date: 09/02/20 Start of observation period - Time: 1100 End of observation period  - Date: 09/02/20 End of observation period - Time: 1200 Agitated Behavior Scale (DO NOT LEAVE BLANKS) Short attention span, easy distractibility, inability to concentrate: Present to an extreme degree Impulsive, impatient, low tolerance for pain or frustration: Present to a moderate degree Uncooperative, resistant to care, demanding: Present to a slight degree Violent and/or threatening violence toward people or property: Absent Explosive and/or unpredictable anger: Absent Rocking, rubbing, moaning, or other self-stimulating behavior: Present to a slight degree Pulling at tubes, restraints, etc.: Present to a moderate degree Wandering from treatment areas: Absent Restlessness, pacing, excessive movement: Present to a moderate degree Repetitive behaviors, motor, and/or verbal: Present to a moderate degree Rapid, loud, or excessive talking: Absent Sudden changes of mood: Absent Easily initiated or excessive crying and/or laughter: Absent Self-abusiveness, physical and/or verbal: Absent Agitated behavior scale total score: 27  ADL: ADL Eating: NPO Grooming: Dependent Where Assessed-Grooming: Bed level Upper Body Bathing: Dependent Where Assessed-Upper Body Bathing: Edge of bed Lower Body Bathing: Dependent Where Assessed-Lower Body Bathing: Bed level Upper Body Dressing: Dependent Lower Body Dressing: Dependent Where Assessed-Lower Body Dressing: Bed level Toileting: Unable to assess Where Assessed-Toileting: Glass blower/designer: Unable to assess Toilet Transfer Method: Unable to assess Vision   Perception    Praxis   Exercises:   Other Treatments:     Therapy/Group: Individual Therapy  Tonny Branch 09/02/2020, 12:07 PM

## 2020-09-02 NOTE — Progress Notes (Signed)
Modified Barium Swallow Progress Note  Patient Details  Name: PHEONIX CLINKSCALE MRN: [id number] Date of Birth: 04-03-1940  Today's Date: 09/02/2020  Modified Barium Swallow completed.  Full report located under Chart Review in the Imaging Section.  Brief recommendations include the following:  Clinical Impression  Patient remained lethargic throughout MBS but demonstrated decreased restlessness and improved sustained attention to tasks. Patient demonstrates a moderate oral dysphagia characterized by decreased lip seal when combined with cognitive impairments resulted in hand over hand assist for self-feeding with cup and inability to utilize a straw. Prolonged AP transit with lingual pumping was also noted with all liquids and solids.  Patient with inconsistent timing and effort of swallow resulted in minimal sensed aspiration with thin liquids and mild pharyngeal residue that patient spontaneously cleared with a second swallow.  Recommend "snacks" of Dys. 1 textures with nectar-thick liquids with focus on attention and automaticity in hopes of initiating a diet quickly as mentation improves. Educated wife on results, she verbalized understanding and agreement.   Swallow Evaluation Recommendations       SLP Diet Recommendations: Alternative means - temporary;NPO ("Snacks" of Dys. 1 textures with nectar-thick liquids with SLP only)       Medication Administration: Via alternative means               Oral Care Recommendations: Oral care QID        Khyren Hing 09/02/2020,12:44 PM

## 2020-09-02 NOTE — Progress Notes (Addendum)
PROGRESS NOTE   Subjective/Complaints: Restless night again. Slept 2 hours. Pt awake and restless to an extent when I came in this morning  ROS: Limited due to cognitive/behavioral    Objective:   DG Abd Portable 1V  Result Date: 09/01/2020 CLINICAL DATA:  Check feeding catheter placement EXAM: PORTABLE ABDOMEN - 1 VIEW COMPARISON:  08/26/2020 FINDINGS: Feeding catheter is again noted in the distal aspect of the stomach. The overall appearance is stable from the prior exam. Scattered large and small bowel gas is noted. IMPRESSION: Stable appearance of feeding catheter in the distal stomach. Electronically Signed   By: Inez Catalina M.D.   On: 09/01/2020 15:29   Recent Labs    08/31/20 0410  WBC 13.0*  HGB 11.6*  HCT 36.9*  PLT 369     Recent Labs    08/31/20 0410  NA 139  K 4.2  CL 105  CO2 28  GLUCOSE 189*  BUN 19  CREATININE 0.84  CALCIUM 8.8*   No intake or output data in the 24 hours ending 09/02/20 0805       Physical Exam: Vital Signs Blood pressure 139/78, pulse (!) 104, temperature 97.8 F (36.6 C), resp. rate 17, height 5\' 7"  (1.702 m), weight 86 kg, SpO2 98 %. Constitutional: No distress . Vital signs reviewed. HEENT: EOMI, oral membranes moist, NGT Neck: supple Cardiovascular: RRR without murmur. No JVD    Respiratory/Chest: CTA Bilaterally without wheezes or rales. Normal effort    GI/Abdomen: BS +, non-tender, non-distended Ext: no clubbing, cyanosis, or edema Psych: restless Musculoskeletal:    Cervical back: no axial or limb pain     Skin:    Comments: Some ecchymoses on arms B/L, wounds closed. Neurological: awake with right gaze preference, delayed.moving all 4 spontaneously but more purposeful movement on right.  Whispers responses to questions, typically inaudible.       Assessment/Plan: 1. Functional deficits which require 3+ hours per day of interdisciplinary therapy in a  comprehensive inpatient rehab setting. Physiatrist is providing close team supervision and 24 hour management of active medical problems listed below. Physiatrist and rehab team continue to assess barriers to discharge/monitor patient progress toward functional and medical goals  Care Tool:  Bathing    Body parts bathed by patient: Chest, Abdomen, Front perineal area, Face, Right arm, Left arm   Body parts bathed by helper: Buttocks, Left upper leg, Right upper leg, Right lower leg, Left lower leg     Bathing assist Assist Level: Moderate Assistance - Patient 50 - 74%     Upper Body Dressing/Undressing Upper body dressing   What is the patient wearing?: Pull over shirt    Upper body assist Assist Level: Maximal Assistance - Patient 25 - 49%    Lower Body Dressing/Undressing Lower body dressing      What is the patient wearing?: Pants, Incontinence brief     Lower body assist Assist for lower body dressing: Total Assistance - Patient < 25%     Toileting Toileting    Toileting assist Assist for toileting: Dependent - Patient 0%     Transfers Chair/bed transfer  Transfers assist     Chair/bed transfer assist  level: Moderate Assistance - Patient 50 - 74%     Locomotion Ambulation   Ambulation assist   Ambulation activity did not occur: Safety/medical concerns  Assist level: Maximal Assistance - Patient 25 - 49% Assistive device: Walker-rolling Max distance: 50 ft   Walk 10 feet activity   Assist  Walk 10 feet activity did not occur: Safety/medical concerns  Assist level: Maximal Assistance - Patient 25 - 49% Assistive device: Walker-rolling   Walk 50 feet activity   Assist Walk 50 feet with 2 turns activity did not occur: Safety/medical concerns  Assist level: Maximal Assistance - Patient 25 - 49% Assistive device: Walker-rolling    Walk 150 feet activity   Assist Walk 150 feet activity did not occur: Safety/medical concerns          Walk 10 feet on uneven surface  activity   Assist Walk 10 feet on uneven surfaces activity did not occur: Safety/medical concerns         Wheelchair     Assist     Wheelchair activity did not occur: Safety/medical concerns         Wheelchair 50 feet with 2 turns activity    Assist    Wheelchair 50 feet with 2 turns activity did not occur: Safety/medical concerns       Wheelchair 150 feet activity     Assist  Wheelchair 150 feet activity did not occur: Safety/medical concerns       Blood pressure 139/78, pulse (!) 104, temperature 97.8 F (36.6 C), resp. rate 17, height 5\' 7"  (1.702 m), weight 86 kg, SpO2 98 %.  Medical Problem List and Plan: 1.  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  Impaired mobility -DVT/anticoagulation:  Eliquis restarted 6/21-pharmacy indicates increased cost to patient. More economical option is xarelto. Asked pharmacy to review with Mrs. Cashen. She will want to speak with cardiology as well.              -antiplatelet therapy: Continue ASA 325 mg/day 3. Pain Management: N/A 4. Mood: LCSW to follow for evaluation and support.              -antipsychotic agents: N/A 5. Neuropsych: This patient is not capable of making decisions on his own behalf. 6. Skin/Wound Care: Routine pressure relief measures.  7. Fluids/Electrolytes/Nutrition: NPO with tube feeds.             -continue reduced water flushes to 100 cc every 4 hours -Na 139 6/27 8. HTN: Monitor BP tid with SBP goal <140/90.              --continue losartan/day, cardura/day and Metoprolol every 6 hours Vitals:   09/01/20 1930 09/02/20 0414  BP: 107/67 139/78  Pulse: 85 (!) 104  Resp: 18 17  Temp: 98.2 F (36.8 C) 97.8 F (36.6 C)  SpO2: 100% 98%    Controlled 6/28  9. T2DM: Hgb A1C- 6.2. well controlled 6/26- continue to monitor. CBG (last 3)  Recent Labs    09/01/20 1956 09/01/20 2344 09/02/20 0409  GLUCAP 151* 137* 157*    10. Lethargy: seems to wax and wane a bit which is expected given his  CT scan  -d/c ritalin given possible seizure activity, continue 200mg  amantadine -suspect UTI   11. PAF: Monitor HR tid--eliquis restarted --SDH post fall 07/27/20             --continue Tikosyn bid             --appreciate pharmacy help to keep K+>4.0/Mg levels  . 12. Thoracic aortic aneurysm: Followed by [facility]. 13. Induced hypernatremia: Improving with increase water flushes  Stable on 6/24, repeat Monday.  14. Hypokalemia: increase repletion to 53meq BID and replete tomorrow.   15. UTI: completed kelfex course 6/26.  -wbc's 13k again 6/26, UA+  -6/29 ucx with 100k pseudomonas---?change to cipro. D/w pharmacy this morning 16. Abdominal distention: better  -downward adjustment of TF/flushes  -dc'ed miralax,     Spoke with wife reviewed chart   17.  Insomnia:   -pt was a night owl PTA, typically staying up to 0200 and waking at 1100 each day  -UTI playing a role in generalized confusion/neurosedation  -keppra also not helping with mental status  -will re-adjust trazodone to 50mg  tonight, dc melatonin 18. Seizure activity: 90 second episode that self resolved on 6/24. Neuro consulted, appreciate recs, IV Ativan PRN, Keppra, seizure precautions  -none since keppra started 19. Dysphagia: pt remains NPO, NGT inplace   LOS: 18 days A FACE TO [place] T Wilman Tucker 09/02/2020, 8:05 AM

## 2020-09-02 NOTE — Progress Notes (Signed)
Patient ID: [name] [name], male   DOB: 1940/07/18, 80 y.o.   MRN: [id number]  If pt is placement, PASSR# [id number] .  Loralee Pacas, MSW, [place] Office: [phone] Cell: [phone] Fax: [phone]

## 2020-09-03 LAB — GLUCOSE, CAPILLARY
Glucose-Capillary: 149 mg/dL — ABNORMAL HIGH (ref 70–99)
Glucose-Capillary: 164 mg/dL — ABNORMAL HIGH (ref 70–99)
Glucose-Capillary: 112 mg/dL — ABNORMAL HIGH (ref 70–99)
Glucose-Capillary: 128 mg/dL — ABNORMAL HIGH (ref 70–99)
Glucose-Capillary: 120 mg/dL — ABNORMAL HIGH (ref 70–99)

## 2020-09-03 LAB — URINE CULTURE: Culture: >=100000 — AB

## 2020-09-03 NOTE — Progress Notes (Signed)
Speech Language Pathology Daily Session Note  Patient Details  Name: [name] [name] MRN: [id number] Date of Birth: February 26, 1941  Today's Date: 09/03/2020 SLP Individual Time: 0820-0915 SLP Individual Time Calculation (min): 55 min  Short Term Goals: Week 3: SLP Short Term Goal 1 (Week 3): Pt will consume therapeutic trials of Dys. 1 textures wiht nectar-thick liquids via tsp with minimal overt s/s of aspiration and min assist for use of swallowing precautions over 2 sessions prior to diet initiation. SLP Short Term Goal 2 (Week 3): Pt will maintain alertness for periods of 2 minutes to participate in basic tasks with max assist multimodal cues consistently over 3 consecutive sessions. SLP Short Term Goal 3 (Week 3): Pt will use multimodal means of communication (gestures, head nod/shake, pointing, verbalizing, vocalizing) to convey his immediate needs and wants to caregivers with mod assist multimodal cues. SLP Short Term Goal 4 (Week 3): Patient will utilize an increased vocal intensity at the phrase level to achieve ~75% intelligibility with Max verbal cues. SLP Short Term Goal 5 (Week 3): Patient will initiate functional tasks with Max A multimodal cues.  Skilled Therapeutic Interventions: Skilled treatment session focused on dysphagia and cognitive goals. SLP facilitated session by providing a snack of Dys. 1 textures with nectar-thick liquids. Patient self-fed pureed textures with Mod verbal and visual cues without overt s/s of aspiration. Patient with minimal overt s/s of aspiration with nectar-thick liquids via spoon given by SLP. However, increased coughing noted with sips via cup when patient attempting to self-feed. Recommend ongoing trials with SLP. Patient perseverative on blowing his nose and demonstrated difficulty with cessation of functional and familiar tasks like brushing his teeth. Patient initiated increased verbal responses today to basic questions with increased vocal intensity  noted with Mod verbal cues. Patient left upright in bed with alarm on and all needs within reach. Continue with current plan of care.      Pain Pain Assessment Pain Scale: Faces Pain Score: 0-No pain  Therapy/Group: Individual Therapy  [name] [name] 09/03/2020, 12:51 PM

## 2020-09-03 NOTE — Progress Notes (Signed)
Physical Therapy Session Note  Patient Details  Name: [name] [name] MRN: [id number] Date of Birth: 1940/05/07  Today's Date: 09/03/2020 PT Individual Time: 1010-1115 PT Individual Time Calculation (min): 65 min   Short Term Goals: Week 3:  PT Short Term Goal 1 (Week 3): Patient will perform bed mobility with mod-min A of 1 person consistently. PT Short Term Goal 2 (Week 3): Patient will perform basic transfers with mod-min A of 1 person with min verbal cues. PT Short Term Goal 3 (Week 3): Patient will ambulate >100 feet with mod A and +2 w/c follow for safety consistently using LRAD. PT Short Term Goal 4 (Week 3): Patient will initiate stair training for strengthening and endurance with a functional task.  Skilled Therapeutic Interventions/Progress Updates:     Patient in bed asleep with his wife present upon PT arrival. Patient slow to arouse, but once alert he remained alert throughout session. Patient's wife shared patient's progress with swallowing with SLP this morning and stated she would be leaving today and a friend would be staying with the patient.   Focused session on gait training and functional activity in a busy environment, main therapy gym, patient tolerated >20 min in the gym with 1-4 other patient's in the room, each accompanied by a therapist, and multisensory distractions. Required max verbal cues for sustained attention to tasks, able to maintain attention >2 min at a time with cuing. Noted reduced processing speed and command following in the room after increased stimulation in the gym, required max verbal cues and max A to initiate mobility due to cognitive fatigue.  LPN unhooked tube feed for mobility throughout session.   Therapeutic Activity: Bed Mobility: Donned shorts with total A, patient lifted each foot to assist with threading and attempted to bridge x2 with cues, hips did not clear so patient performed rolling with min A and mod cuing for sequencing to pull up  pants with total A. Donned non-skid socks with total A with patient following cues to lift each foot for therapist. Patient performed supine to sit with min A to initiate and supervision to complete task with increased time for initiation and hand over hand assist to initiate roll to the L. Patient provided >4 min and max cues for returning to lying at end of session. Patient sat EOB with supervision externally distracted by therapist's badge, his NG tube, and his hands, he pointed to the walker x1 and attempted to provide ambulation for restlessness, however, patient stood with min A then returned to sitting and pushed the walker away. Patient continued to perseverate on distractions and did not follow cues for lying down. Provided total A for patient to return to lying and repositioning in the bed. Patient calm once lying in the bed with less restlessness with his hands.  Transfers: Patient performed stand pivot bed<>w/c with min A with >30 to initiate transfer OOB with hand-over-hand assist for reaching for arm rest and >1 min to initiate transfer back to bed, would reach for bed rail padding and pull straps instead of grab hand rail despite max cuing, once padding moved out of the way patient able to grab the rail and initiate transfer. He performed sit to/from stand x1 without RW and returned to sitting with mod A, patient then pointed to the RW, asked patient what he wanted and he said "walker." He then performed sit to/from stand using the RW x1 with min A. Provided multimodal cues for L hand placement on RW due to  undershooting.  Gait Training:  Patient ambulated ~10 feet using RW following cues to ambulate to mat table and >50 feet using RW in busy environment with min A for balance and mod/max A for AD management to promote forward propulsion and block L turning (intermittent mod A for L stepping to reduce L turn) with max cuing to sustain attention on the clock across the room to promote erect posture  and provide a visual target for the patient. Patient self-initiated several standing breaks due to external distraction in the is environment, but was able to be redirected to task each time. Once arrived at the clock asked patient to read the time, patient looked at his L wrist, no watch present, then looked back up at the clock >30 sec, but did not provide an answer. Patient was then able to follow cues to perform 180 deg turn and focus on w/c as visual target to ambulate back to the w/c, however became distracted and unable to redirected when seeing card matching activity on the back of the tall mirror.   Neuromuscular Re-ed: Patient performed the following standing balance activities: -standing >5 min at the back of the tall mirror patient removed 1 card spontaneously and placed it in his pocket, PT provided max cues for removal of a specific card on the L side of the mirror, patient scanned to the L and located each card with increased time, but did not pull hard enough to remove them, patient then distracted by items on shelf within reach, required max cues for redirection to task, patient then perseverated on looking in his pockets for the card, searched all pockets thoroughly, but unable to produce the card, cued patient to ambulate back to the w/c across the room, but patient verbalized that he wanted to sit, w/c brought up to patient by second person   Patient in bed with his friend at bedside at end of session with breaks locked, bed alarm set, and all needs within reach.   Therapy Documentation Precautions:  Precautions Precautions: Fall Precaution Comments: cortrak, confusion, decreased ability to follow instructional commands Restrictions Weight Bearing Restrictions: No    Therapy/Group: Individual Therapy  Willena Jeancharles L Tyrrell Stephens PT, DPT  09/03/2020, 8:43 PM

## 2020-09-03 NOTE — Progress Notes (Signed)
PROGRESS NOTE   Subjective/Complaints: Pt slept a little better last night. Still restless at times. Seems more alert this morning. Wife at bedside  ROS: Limited due to cognitive/behavioral    Objective:   DG Abd Portable 1V  Result Date: 09/01/2020 CLINICAL DATA:  Check feeding catheter placement EXAM: PORTABLE ABDOMEN - 1 VIEW COMPARISON:  08/26/2020 FINDINGS: Feeding catheter is again noted in the distal aspect of the stomach. The overall appearance is stable from the prior exam. Scattered large and small bowel gas is noted. IMPRESSION: Stable appearance of feeding catheter in the distal stomach. Electronically Signed   By: Inez Catalina M.D.   On: 09/01/2020 15:29   DG Swallowing Func-Speech Pathology  Result Date: 09/02/2020 Formatting of this result is different from the original. 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  External hemorrhoids without mention of complication   H/O aortic valve replacement   H/O mitral valve replacement   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  Lesion of ulnar nerve   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   Other and unspecified hyperlipidemia   Pre-diabetes   Previous back surgery 1978, jan 2007  Psychosexual dysfunction with inhibited sexual excitement   SOB (shortness of breath)   "with heavy exercise"  Stroke ([place]) 08/2013  "I WAS IN AFIB AND THREW A CLOT, THE EFFECTS WERE TRANSITORY AND DIDNT LAST BUT FOR 30 MINUTES"   Thoracic aortic aneurysm [facility])  Past  Surgical History: Past Surgical History: Procedure Laterality Date  AORTIC AND MITRAL VALVE REPLACEMENT    09/2004  CARDIOVERSION    3 times from 2004-2006  CARDIOVERSION N/A 09/26/2013  Procedure: CARDIOVERSION;  Surgeon: Larey Dresser, MD;  Location: [place];  Service: Cardiovascular;  Laterality: N/A;  CARDIOVERSION N/A 06/19/2014  Procedure: CARDIOVERSION;  Surgeon: Jerline Pain, MD;  Location: [place];  Service: Cardiovascular;  Laterality: N/A;  CARDIOVERSION N/A 04/24/2017  Procedure: CARDIOVERSION;  Surgeon: Larey Dresser, MD;  Location: [place];  Service: Cardiovascular;  Laterality: N/A;  CARDIOVERSION N/A 06/08/2017  Procedure: CARDIOVERSION;  Surgeon: Pixie Casino, MD;  Location: [place];  Service: Cardiovascular;  Laterality: N/A;  CARDIOVERSION N/A 08/24/2017  Procedure: CARDIOVERSION;  Surgeon: Skeet Latch, MD;  Location: [place];  Service: Cardiovascular;  Laterality: N/A;  CARDIOVERSION N/A 02/14/2018  Procedure: CARDIOVERSION;  Surgeon: Larey Dresser, MD;  Location: [facility] ENDOSCOPY;  Service: Cardiovascular;  Laterality: N/A;  COLONOSCOPY    CRANIOTOMY N/A 07/28/2020  Procedure: CRANIOTOMY FOR EVACUATION OF SUBDURAL HEMATOMA;  Surgeon: Eustace Conway, MD;  Location: [place];  Service: Neurosurgery;  Laterality: N/A;  IR ANGIO VERTEBRAL SEL SUBCLAVIAN INNOMINATE UNI R MOD SED  08/07/2020  IR CT HEAD LTD  08/07/2020  IR PERCUTANEOUS ART THROMBECTOMY/INFUSION  INTRACRANIAL INC DIAG ANGIO  08/07/2020  laminectomies    10/1975 and in 03/2005  RADIOLOGY WITH ANESTHESIA N/A 08/07/2020  Procedure: IR WITH ANESTHESIA;  Surgeon: Luanne Bras, MD;  Location: [place];  Service: Radiology;  Laterality: N/A;  TONSILLECTOMY    1950  TOTAL HIP ARTHROPLASTY Right 04/03/2018  Procedure: TOTAL HIP ARTHROPLASTY ANTERIOR APPROACH;  Surgeon: Paralee Cancel, MD;  Location: [facility];  Service: Orthopedics;  Laterality: Right;  70 mins HPI: See H&P  No data recorded Assessment / Plan / Recommendation  CHL IP CLINICAL IMPRESSIONS 09/02/2020 Clinical Impression Patient remained lethargic throughout MBS but demonstrated decreased restlessness and improved sustained attention to tasks. Patient demonstrates a moderate oral dysphagia characterized by decreased lip seal when combined with cognitive impairments resulted in hand over hand assist for self-feeding with cup and inability to utilize a straw. Prolonged AP transit with lingual pumping was also noted with all liquids and solids.  Patient with inconsistent timing and effort of swallow resulted in minimal sensed aspiration with thin liquids and mild pharyngeal residue that patient spontaneously cleared with a second swallow.  Recommend "snacks" of Dys. 1 textures with nectar-thick liquids with focus on attention and automaticity in hopes of initiating a diet quickly as mentation improves. Educated wife on results, she verbalized understanding and agreement. SLP Visit Diagnosis Dysphagia, oropharyngeal phase (R13.12) Attention and concentration deficit following -- Frontal lobe and executive function deficit following -- Impact on safety and function Moderate aspiration risk   CHL IP TREATMENT RECOMMENDATION 09/02/2020 Treatment Recommendations Therapy as outlined in treatment plan below   Prognosis 09/02/2020 Prognosis for Safe Diet Advancement Fair Barriers to Reach Goals Cognitive deficits;Severity of deficits Barriers/Prognosis Comment -- CHL IP DIET RECOMMENDATION 09/02/2020 SLP Diet Recommendations Alternative means - temporary;NPO Liquid Administration via -- Medication Administration Via alternative means Compensations -- Postural Changes --   CHL IP OTHER RECOMMENDATIONS 09/02/2020 Recommended Consults -- Oral Care Recommendations Oral care QID Other Recommendations --   CHL IP FOLLOW UP RECOMMENDATIONS 09/02/2020 Follow up Recommendations Inpatient Rehab   CHL IP FREQUENCY AND DURATION 09/02/2020 Speech Therapy Frequency (ACUTE ONLY) min 3x week Treatment  Duration 2 weeks      CHL IP ORAL PHASE 09/02/2020 Oral Phase -- Oral - Pudding Teaspoon -- Oral - Pudding Cup -- Oral - Honey Teaspoon -- Oral - Honey Cup -- Oral - Nectar Teaspoon Reduced posterior propulsion;Lingual pumping;Decreased bolus cohesion;Delayed oral transit Oral - Nectar Cup Decreased bolus cohesion;Delayed oral transit;Lingual pumping;Reduced posterior propulsion Oral - Nectar Straw -- Oral - Thin Teaspoon Reduced posterior propulsion;Delayed oral transit;Decreased bolus cohesion;Lingual pumping;Weak lingual manipulation Oral - Thin Cup -- Oral - Thin Straw -- Oral - Puree Weak lingual manipulation;Lingual pumping;Delayed oral transit Oral - Mech Soft -- Oral - Regular -- Oral - Multi-Consistency -- Oral - Pill -- Oral Phase - Comment --  CHL IP PHARYNGEAL PHASE 09/02/2020 Pharyngeal Phase Impaired Pharyngeal- Pudding Teaspoon -- Pharyngeal -- Pharyngeal- Pudding Cup -- Pharyngeal -- Pharyngeal- Honey Teaspoon -- Pharyngeal -- Pharyngeal- Honey Cup -- Pharyngeal -- Pharyngeal- Nectar Teaspoon Delayed swallow initiation-vallecula;Delayed swallow initiation-pyriform sinuses Pharyngeal -- Pharyngeal- Nectar Cup Delayed swallow initiation-vallecula;Delayed swallow initiation-pyriform sinuses;Pharyngeal residue - valleculae;Pharyngeal residue - pyriform Pharyngeal -- Pharyngeal- Nectar Straw -- Pharyngeal -- Pharyngeal- Thin Teaspoon Delayed swallow initiation-pyriform sinuses;Penetration/Aspiration during swallow Pharyngeal Material enters airway, passes BELOW cords then ejected out Pharyngeal- Thin Cup -- Pharyngeal -- Pharyngeal- Thin Straw -- Pharyngeal -- Pharyngeal- Puree Delayed swallow initiation-vallecula;Pharyngeal residue - valleculae Pharyngeal -- Pharyngeal- Mechanical Soft -- Pharyngeal -- Pharyngeal-  Regular -- Pharyngeal -- Pharyngeal- Multi-consistency -- Pharyngeal -- Pharyngeal- Pill -- Pharyngeal -- Pharyngeal Comment --  No flowsheet data found. PAYNE, [place] 09/02/2020, 12:45 PM     [place] Anna, MA, CCC-SLP [phone]           No results for input(s): WBC, HGB, HCT, PLT in the last 72 hours.    No results for input(s): NA, K, CL, CO2, GLUCOSE, BUN, CREATININE, CALCIUM in the last 72 hours.  No intake or output data in the 24 hours ending 09/03/20 0920       Physical Exam: Vital Signs Blood pressure 124/76, pulse 78, temperature 97.8 F (36.6 C), temperature source Oral, resp. rate 19, height 5\' 7"  (1.702 m), weight 86 kg, SpO2 96 %. Constitutional: No distress . Vital signs reviewed. HEENT: EOMI, oral membranes moist, NGT Neck: supple Cardiovascular: RRR without murmur. No JVD    Respiratory/Chest: CTA Bilaterally without wheezes or rales. Normal effort    GI/Abdomen: BS +, non-tender, non-distended Ext: no clubbing, cyanosis, or edema Psych: less restless this AM Musculoskeletal:    Cervical back: no axial or limb pain     Skin:    Comments: Some ecchymoses on arms B/L, wounds closed. Neurological: awake with right gaze preference, delayed.moving all 4 spontaneously but more purposeful movement on right.  Whispers only---a little louder today. Still distracted.       Assessment/Plan: 1. Functional deficits which require 3+ hours per day of interdisciplinary therapy in a comprehensive inpatient rehab setting. Physiatrist is providing close team supervision and 24 hour management of active medical problems listed below. Physiatrist and rehab team continue to assess barriers to discharge/monitor patient progress toward functional and medical goals  Care Tool:  Bathing    Body parts bathed by patient: Chest, Abdomen, Front perineal area, Face, Right arm, Left arm   Body parts bathed by helper: Buttocks, Left upper leg, Right upper leg, Right lower leg, Left lower leg     Bathing assist Assist Level: Moderate Assistance - Patient 50 - 74%     Upper Body Dressing/Undressing Upper body dressing   What is the patient wearing?: Pull over shirt     Upper body assist Assist Level: Maximal Assistance - Patient 25 - 49%    Lower Body Dressing/Undressing Lower body dressing      What is the patient wearing?: Pants, Incontinence brief     Lower body assist Assist for lower body dressing: Total Assistance - Patient < 25%     Toileting Toileting    Toileting assist Assist for toileting: Dependent - Patient 0%     Transfers Chair/bed transfer  Transfers assist     Chair/bed transfer assist level: Moderate Assistance - Patient 50 - 74%     Locomotion Ambulation   Ambulation assist   Ambulation activity did not occur: Safety/medical concerns  Assist level: Maximal Assistance - Patient 25 - 49% Assistive device: Walker-rolling Max distance: 50 ft   Walk 10 feet activity   Assist  Walk 10 feet activity did not occur: Safety/medical concerns  Assist level: Maximal Assistance - Patient 25 - 49% Assistive device: Walker-rolling   Walk 50 feet activity   Assist Walk 50 feet with 2 turns activity did not occur: Safety/medical concerns  Assist level: Maximal Assistance - Patient 25 - 49% Assistive device: Walker-rolling    Walk 150 feet activity   Assist Walk 150 feet activity did not occur: Safety/medical concerns         Walk 10 feet on  uneven surface  activity   Assist Walk 10 feet on uneven surfaces activity did not occur: Safety/medical Armed forces technical officer activity did not occur: Safety/medical concerns         Wheelchair 50 feet with 2 turns activity    Assist    Wheelchair 50 feet with 2 turns activity did not occur: Safety/medical concerns       Wheelchair 150 feet activity     Assist  Wheelchair 150 feet activity did not occur: Safety/medical concerns       Blood pressure 124/76, pulse 78, temperature 97.8 F (36.6 C), temperature source Oral, resp. rate 19, height 5\' 7"  (1.702 m), weight 86 kg, SpO2 96 %.  Medical  Problem List and Plan: 1.  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  Impaired mobility -DVT/anticoagulation:  Eliquis restarted 6/21-pharmacy indicates increased cost to patient. More economical option is xarelto. Asked pharmacy to review with Mrs. Pernice. She will want to speak with cardiology as well.              -antiplatelet therapy: Continue ASA 325 mg/day 3. Pain Management: N/A 4. Mood: LCSW to follow for evaluation and support.              -antipsychotic agents: N/A 5. Neuropsych: This patient is not capable of making decisions on his own behalf. 6. Skin/Wound Care: Routine pressure relief measures.  7. Fluids/Electrolytes/Nutrition: NPO with tube feeds.             -continue reduced water flushes to 100 cc every 4 hours -Na 139 6/27 8. HTN: Monitor BP tid with SBP goal <140/90.              --continue losartan/day, cardura/day and Metoprolol every 6 hours Vitals:   09/02/20 1943 09/03/20 0402  BP: 114/85 124/76  Pulse: 89 78  Resp: 17 19  Temp: 98.7 F (37.1 C) 97.8 F (36.6 C)  SpO2: 98% 96%   Controlled 6/30 9. T2DM: Hgb A1C- 6.2. well controlled 6/26- continue to monitor. CBG (last 3)  Recent Labs    09/02/20 2343 09/03/20 0357 09/03/20 0806  GLUCAP 161* 149* 164*    10. Lethargy: seems to wax and wane a bit which is expected given his CT scan  -continue 200mg  amantadine -rx UTI   11. PAF: Monitor HR tid--eliquis restarted --SDH post fall 07/27/20             --continue Tikosyn bid             --appreciate pharmacy help to keep K+>4.0/Mg levels  . 12. Thoracic aortic aneurysm: Followed by [facility]. 13. Induced hypernatremia: Improving with increase water flushes  Stable on 6/24, repeat Monday.  14. Hypokalemia: increase repletion to  38meq BID and replete tomorrow.   15. UTI: completed kelfex course 6/26.  -wbc's 13k again 6/26, UA+  -6/30 ucx with 100k pseudomonas---quinolone sensitive. Continue levaquin---7 day course 16. Abdominal distention: better  -tolerating TF currently  -dc'ed miralax,     Spoke with wife reviewed chart   17.  Insomnia:   -  pt was a night owl PTA, typically staying up to 0200 and waking at 1100 each day  -UTI playing a role in generalized confusion/neurosedation  -keppra also not helping with mental status  -resumed trazodone at 50mg  qhs. Consider low dose seroquel in place of traz 18. Seizure activity: 90 second episode that self resolved on 6/24. Neuro consulted, appreciate recs, IV Ativan PRN, Keppra, seizure precautions  -none since keppra initiated  19. Dysphagia: pt remains NPO except for trials of D1,nectar with SLP   -mbs 6/29 with moderate oral dysphagia, some pharyngeal component as well LOS: 19 days A FACE TO [place] 09/03/2020, 9:20 AM

## 2020-09-03 NOTE — Progress Notes (Signed)
Pt slept for 4 hours, restless most of the shift. Resting in brief intervals.Continues to pull at mittens and tubing. Frequent checks maintained.

## 2020-09-03 NOTE — Progress Notes (Signed)
Occupational Therapy Session Note  Patient Details  Name: [name] [name] MRN: [id number] Date of Birth: [birth year]  Today's Date: 09/03/2020 OT Individual Time: 1418-1530 OT Individual Time Calculation (min): 72 min    Short Term Goals: Week 3:  OT Short Term Goal 1 (Week 3): Pt will complete stand pivot transfer with mod A OT Short Term Goal 2 (Week 3): Pt will complete UB bathing with min A OT Short Term Goal 3 (Week 3): Pt will sequence donning shirt with no more than mod cueing and mod A  Skilled Therapeutic Interventions/Progress Updates:    Pt received supine in bed, restraints and mitts in place, wife present bedside, agreeable to OT. Session focused on BADLs, cognitive retraining (initiation, sequencing, command following, attention), tolerating OOB and ADL activities, and functional mobility. Removed bilateral restraints/mitts; throughout session pt demoed pulling at tubes with min-mod A to terminate. Pt frequently requesting tissue to wipe R nostril of dried blood. Pt requested chapstick from wife and placed on lips accurately without cueing. LB dressing (donning pants) supine max A to thread and pull over hips - with vc's pt able to bridge hips slightly 2x. Pt attempted buttoning pants but unable to coordinate BUEs; however, following 1 step command pt zipped zipper independently. Forward chaining technique used to don L sock using figure 4 technique; dependent to don R sock in figure 4 (per pt wife he cannot reach due to previous R hip surgery).   Pt perseverating throughout session on multiple factors including wanting to drink water, pulling at staff badges, pulling on doorways, and pulling on staff clothing. Sup>sit max A +1 and significantly increased time as pt kept trying to bring BLEs back in bed. Sitting EOB, pt demoed significant L lean with max A intermittently and max vc's to correct for midline orientation. Washed pt's hair with shower cap with no adverse reactions. Sit<>stand  completed mod A +2 using 3 musketeers technique. At first required max vc's and manual facilitation of BLEs to initiate steps and weight shifting. However, pt suddenly began stepping and completed functional ambulation ~200' in 3 musketeers technique with w/c follow using mod verbal and tactile cues to encourage continuation and not turning back.   Upon return to room, pt accepted request for toileting at standard toilet. Clothing management max A. Initially required HOHA to initiate voiding bladder, but pt continent of bladder standing with use of grab bars mod A for balance and safety. Pt with limited aim and spilled on clothing and floor; following pulling up pants, pt demoed need for BM. Incontinent and continent of bowels on standard toilet (stand turning transfer min A). Pt attempted wiping buttocks seated, but unable to follow cues to lean; wiping completed in stance dependently with +2 for safety. Pt washed hands mod-max A standing at sink, mod vc's for sequencing. Wife expressing excitement and gratitude of pt's toileting progress this session. Sit>sup mod A for BLE management; scooting HOB max A +2 due to fatigue. Pt remained supine, restraints reapplied, wife present, all needs met.  Therapy Documentation Precautions:  Precautions Precautions: Fall Precaution Comments: cortrak, confusion, decreased ability to follow instructional commands Restrictions Weight Bearing Restrictions: No  Pain: Pain Assessment Pain Scale: 0-10 Pain Score: 0-No pain   Therapy/Group: Individual Therapy  Mellissa Kohut 09/03/2020, 3:48 PM

## 2020-09-03 NOTE — Progress Notes (Signed)
Occupational Therapy TBI Note  Patient Details  Name: [name] [name] MRN: [id number] Date of Birth: 25-Apr-1940  Today's Date: 09/03/2020 OT Individual Time: 1130-1159 OT Individual Time Calculation (min): 29 min    Short Term Goals: Week 3:  OT Short Term Goal 1 (Week 3): Pt will complete stand pivot transfer with mod A OT Short Term Goal 2 (Week 3): Pt will complete UB bathing with min A OT Short Term Goal 3 (Week 3): Pt will sequence donning shirt with no more than mod cueing and mod A  Skilled Therapeutic Interventions/Progress Updates:    Pt in bed to start asleep with friend present.  He needed max stimulation to wake up with max demonstrational cueing with max assist for initiation and completion of supine to sit.  Once sitting, when told to scoot forward he would not initiate and with every command given on the EOB, he would instead try to lay back down.  Close supervision for sitting balance but max assist to keep from trying to lay back down.  Had him complete sit to stand with the RW and mod assist with ambulation to the sink for attempts at grooming tasks.  He needed total +2 (pt 40%) for mobility secondary to trying to steer the walker to the left side.  Once at the sink, he continued trying to turn on the water and at one point turned it on and placed some in his hand with attempts to bring it up and drink it.  Max demonstrational cueing for re-direction with therapist presenting comb to pt for attempted use.  He continued perseverating on trying to turn on the water, so wheelchair was brought up for him to sit in and then he was moved back where he couldn't reach the faucet.  Max hand over hand for pt to take comb with the RUE, as initially he would not take it.  Once he did take it, therapist asked what it was and he whispered comb and proceeded to use it with the RUE.  He attempted to switch hands and use the LUE as well with max facilitation from therapist to keep grasp on it and bring  it up to his head.  Decreased thoroughness throughout.  While sitting he was also presented with a washcloth, which he would not initiate use with to begin.  He finally took it in the right hand and brushed it over his head as he did with the comb.  Next, had him complete functional mobility out in the hallway with use of the RW and total +2 (pt 40%).  He needed max assist to stay inside of the walker and to direct it as he would continue to turn to the left toward the walls.  He was also resistant to therapist direction as well, so walker was abandoned hand held assist was used.  He was still resistance to demonstrational cueing for direction to walk.  Did get him back to the room with transfer back to the EOB.  When instructed to lay back down, he would instead try to stand up.  Max assist to return to supine with placement of mitts and wrist restraints.  His friend was present as well.  Safety alarm in place with call button at bedside.    Therapy Documentation Precautions:  Precautions Precautions: Fall Precaution Comments: cortrak, confusion, decreased ability to follow instructional commands Restrictions Weight Bearing Restrictions: No   Pain: Pain Assessment Pain Scale: Faces Pain Score: 0-No pain Faces Pain Scale:  No hurt Agitated Behavior Scale: TBI Observation Details Observation Environment: pt room Start of observation period - Date: 09/03/20 Start of observation period - Time: 1130 End of observation period - Date: 09/03/20 End of observation period - Time: 1200 Agitated Behavior Scale (DO NOT LEAVE BLANKS) Short attention span, easy distractibility, inability to concentrate: Present to an extreme degree Impulsive, impatient, low tolerance for pain or frustration: Present to an extreme degree Uncooperative, resistant to care, demanding: Present to a moderate degree Violent and/or threatening violence toward people or property: Absent Explosive and/or unpredictable anger:  Absent Rocking, rubbing, moaning, or other self-stimulating behavior: Absent Pulling at tubes, restraints, etc.: Present to an extreme degree Wandering from treatment areas: Present to a slight degree Restlessness, pacing, excessive movement: Present to a moderate degree Repetitive behaviors, motor, and/or verbal: Present to a moderate degree Rapid, loud, or excessive talking: Absent Sudden changes of mood: Present to a slight degree Easily initiated or excessive crying and/or laughter: Absent Self-abusiveness, physical and/or verbal: Absent Agitated behavior scale total score: 31  ADL: See Care Tool Section for some details of mobility and selfcare  Therapy/Group: Individual Therapy  Hiliary Osorto OTR/L 09/03/2020, 12:25 PM

## 2020-09-04 LAB — GLUCOSE, CAPILLARY
Glucose-Capillary: 137 mg/dL — ABNORMAL HIGH (ref 70–99)
Glucose-Capillary: 138 mg/dL — ABNORMAL HIGH (ref 70–99)
Glucose-Capillary: 144 mg/dL — ABNORMAL HIGH (ref 70–99)
Glucose-Capillary: 153 mg/dL — ABNORMAL HIGH (ref 70–99)
Glucose-Capillary: 157 mg/dL — ABNORMAL HIGH (ref 70–99)
Glucose-Capillary: 195 mg/dL — ABNORMAL HIGH (ref 70–99)

## 2020-09-04 MED ORDER — TRAZODONE HCL 50 MG PO TABS
75.0000 mg | ORAL_TABLET | Freq: Every day | ORAL | Status: DC
  Administered 2020-09-04 – 2020-09-07 (×4): 75 mg via NASOGASTRIC
  Filled 2020-09-04 (×4): qty 2

## 2020-09-04 NOTE — Progress Notes (Signed)
Speech Language Pathology Daily Session Notes  Patient Details  Name: [name] [name] MRN: [id number] Date of Birth: [birth year]  Today's Date: 09/04/2020  Session 1: SLP Individual Time: 1100-1130 SLP Individual Time Calculation (min): 30 min  Session 2: SLP Individual Time: 1430-1500 SLP Individual Time Calculation (min): 30 min  Short Term Goals: Week 3: SLP Short Term Goal 1 (Week 3): Pt will consume therapeutic trials of Dys. 1 textures wiht nectar-thick liquids via tsp with minimal overt s/s of aspiration and min assist for use of swallowing precautions over 2 sessions prior to diet initiation. SLP Short Term Goal 2 (Week 3): Pt will maintain alertness for periods of 2 minutes to participate in basic tasks with max assist multimodal cues consistently over 3 consecutive sessions. SLP Short Term Goal 3 (Week 3): Pt will use multimodal means of communication (gestures, head nod/shake, pointing, verbalizing, vocalizing) to convey his immediate needs and wants to caregivers with mod assist multimodal cues. SLP Short Term Goal 4 (Week 3): Patient will utilize an increased vocal intensity at the phrase level to achieve ~75% intelligibility with Max verbal cues. SLP Short Term Goal 5 (Week 3): Patient will initiate functional tasks with Max A multimodal cues.  Skilled Therapeutic Interventions:   Session 1: Skilled treatment session focused on dysphagia and cognitive goals. SLP facilitated session by providing Max A verbal and tactile cues for initiation of self-feeding of honey-thick liquids via cup and Dys. 1 textures. Patient consumed snack without overt s/s of aspiration with the exception of 1 cough with liquids. Suspect due to fatigue and lethargy. Patient continues to demonstrate a moderate-severe left inattention and requires Max A multimodal cues to scan to left field of environment to locate clinician. Patient continues to demonstrate decreased verbal expression and requires Max A multimodal  cues and extra time to respond at the word level. Recommend ongoing trials with SLP only. Patient left upright in bed with alarm on, restraints in place and all needs within reach. Continue with current plan of care.   Session 2: Skilled treatment session focused on dysphagia and speech goals. SLP facilitated session by providing Max A verbal and tactile cues for initiation of self-feeding of honey-thick liquids via cup. Patient consumed without overt s/s of aspiration with the exception of 1 cough with liquids. Suspect due to fatigue and lethargy. Patient with minimal verbal expression and requires Max A multimodal cues and extra time to respond at the word level to basic questions regarding wants/needs. Recommend ongoing trials with SLP only. Patient left upright in bed with alarm on, restraints in place and all needs within reach. Continue with current plan of care.      Pain Pain Assessment Pain Scale: 0-10 Pain Score: 0-No pain  Therapy/Group: Individual Therapy  Winfield Caba 09/04/2020, 2:06 PM

## 2020-09-04 NOTE — Progress Notes (Addendum)
PROGRESS NOTE   Subjective/Complaints: Didn't sleep a whole lot last night but fairly alert and attentive this morning. Was up in BR with OT when I came in today.   ROS: Limited due to cognitive/behavioral    Objective:   No results found. No results for input(s): WBC, HGB, HCT, PLT in the last 72 hours.    No results for input(s): NA, K, CL, CO2, GLUCOSE, BUN, CREATININE, CALCIUM in the last 72 hours.   Intake/Output Summary (Last 24 hours) at 09/04/2020 0959 Last data filed at 09/03/2020 2001 Gross per 24 hour  Intake --  Output 1 ml  Net -1 ml         Physical Exam: Vital Signs Blood pressure (!) 148/94, pulse 91, temperature 98.7 F (37.1 C), temperature source Oral, resp. rate 18, height 5\' 7"  (1.702 m), weight 86 kg, SpO2 98 %. Constitutional: No distress . Vital signs reviewed. HEENT: EOMI, oral membranes moist Neck: supple Cardiovascular: RRR without murmur. No JVD    Respiratory/Chest: CTA Bilaterally without wheezes or rales. Normal effort    GI/Abdomen: BS +, non-tender, non-distended Ext: no clubbing, cyanosis, or edema Psych: pleasant and cooperative Musculoskeletal:    Cervical back: no axial or limb pain     Skin:    Comments: Some ecchymoses on arms B/L, wounds closed. Neurological: awake, more attentive. Right gaze preference. Still whispers only---a little louder today. Still distracted.       Assessment/Plan: 1. Functional deficits which require 3+ hours per day of interdisciplinary therapy in a comprehensive inpatient rehab setting. Physiatrist is providing close team supervision and 24 hour management of active medical problems listed below. Physiatrist and rehab team continue to assess barriers to discharge/monitor patient progress toward functional and medical goals  Care Tool:  Bathing    Body parts bathed by patient: Chest, Abdomen, Front perineal area, Face, Right arm, Left arm    Body parts bathed by helper: Buttocks, Left upper leg, Right upper leg, Right lower leg, Left lower leg     Bathing assist Assist Level: Moderate Assistance - Patient 50 - 74%     Upper Body Dressing/Undressing Upper body dressing   What is the patient wearing?: Pull over shirt    Upper body assist Assist Level: Maximal Assistance - Patient 25 - 49%    Lower Body Dressing/Undressing Lower body dressing      What is the patient wearing?: Pants     Lower body assist Assist for lower body dressing: Total Assistance - Patient < 25%     Toileting Toileting    Toileting assist Assist for toileting: Total Assistance - Patient < 25%     Transfers Chair/bed transfer  Transfers assist     Chair/bed transfer assist level: 2 Helpers     Locomotion Ambulation   Ambulation assist   Ambulation activity did not occur: Safety/medical concerns  Assist level: 2 helpers Assistive device: Walker-rolling Max distance: 50'   Walk 10 feet activity   Assist  Walk 10 feet activity did not occur: Safety/medical concerns  Assist level: Maximal Assistance - Patient 25 - 49% Assistive device: Walker-rolling   Walk 50 feet activity   Assist Walk 50  feet with 2 turns activity did not occur: Safety/medical concerns  Assist level: Maximal Assistance - Patient 25 - 49% Assistive device: Walker-rolling    Walk 150 feet activity   Assist Walk 150 feet activity did not occur: Safety/medical concerns         Walk 10 feet on uneven surface  activity   Assist Walk 10 feet on uneven surfaces activity did not occur: Safety/medical concerns         Wheelchair     Assist     Wheelchair activity did not occur: Safety/medical concerns         Wheelchair 50 feet with 2 turns activity    Assist    Wheelchair 50 feet with 2 turns activity did not occur: Safety/medical concerns       Wheelchair 150 feet activity     Assist  Wheelchair 150 feet  activity did not occur: Safety/medical concerns       Blood pressure (!) 148/94, pulse 91, temperature 98.7 F (37.1 C), temperature source Oral, resp. rate 18, height 5\' 7"  (1.702 m), weight 86 kg, SpO2 98 %.  Medical Problem List and Plan: 1.  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  Impaired mobility -DVT/anticoagulation:  Eliquis restarted 6/21-pharmacy indicates increased cost to patient. More economical option is xarelto. Asked pharmacy to review with Mrs. Larock. She will want to speak with cardiology as well.              -antiplatelet therapy: Continue ASA 325 mg/day 3. Pain Management: N/A 4. Mood: LCSW to follow for evaluation and support.              -antipsychotic agents: N/A 5. Neuropsych: This patient is not capable of making decisions on his own behalf. 6. Skin/Wound Care: Routine pressure relief measures.  7. Fluids/Electrolytes/Nutrition: NPO with tube feeds.             -continue reduced water flushes to 100 cc every 4 hours -Na 139 6/27---recheck Monday 8. HTN: Monitor BP tid with SBP goal <140/90.              --continue losartan/day, cardura/day and Metoprolol every 6 hours Vitals:   09/03/20 2001 09/04/20 0409  BP: 127/89 (!) 148/94  Pulse: (!) 105 91  Resp: 18 18  Temp: 97.8 F (36.6 C) 98.7 F (37.1 C)  SpO2: 92% 98%   Controlled 7/1 9. T2DM: Hgb A1C- 6.2. well controlled . CBG (last 3)  Recent Labs    09/04/20 0014 09/04/20 0410 09/04/20 0902  GLUCAP 157* 138* 137*    10. Lethargy: improving  -continue 200mg  amantadine -rx UTI   11. PAF: Monitor HR tid--eliquis restarted --SDH post fall 07/27/20             --continue Tikosyn bid             --appreciate pharmacy help to keep K+>4.0/Mg levels   7/1 check EKG today per  pharm recs now that he's receiving quinolone. QT. 12. Thoracic aortic aneurysm: Followed by [facility]. 13. Induced hypernatremia: Improving with increase water flushes  Stable, repeat labs tomorrow.  14. Hypokalemia: increase repletion to 69meq BID  and replete tomorrow.   15. UTI: completed kelfex course 6/26.  -6/30 ucx with 100k pseudomonas---quinolone sensitive. Continue levaquin for 7 day course  16. Abdominal distention: better  -tolerating TF currently  -dc'ed miralax,     Spoke with wife reviewed chart   17.  Insomnia:   -pt was a night owl PTA, typically staying up to 0200 and waking at 1100 each day  -UTI playing a role in generalized confusion/neurosedation  -keppra also not helping with mental status  -not sleeping much with trazodone so far. Will increase to 75mg  qhs this evening.  Probably shouldn't be using seroquel given a fib/tikosyn 18. Seizure activity: 90 second episode that self resolved on 6/24. Neuro consulted, appreciate recs, IV Ativan PRN, Keppra, seizure precautions  -none since keppra initiated  19. Dysphagia: pt remains NPO except for trials of D1,nectar with SLP   -mbs 6/29 with moderate oral dysphagia, some pharyngeal component as well  -hopefully with improved arousal and attention we can move towards diet LOS: 20 days A FACE TO [place] 09/04/2020, 9:59 AM

## 2020-09-04 NOTE — Progress Notes (Signed)
Physical Therapy Session Note  Patient Details  Name: [name] [name] MRN: [id number] Date of Birth: [birth year]  Today's Date: 09/04/2020 PT Individual Time: 4332-9518 PT Individual Time Calculation (min): 65 min   Short Term Goals: Week 3:  PT Short Term Goal 1 (Week 3): Patient will perform bed mobility with mod-min A of 1 person consistently. PT Short Term Goal 2 (Week 3): Patient will perform basic transfers with mod-min A of 1 person with min verbal cues. PT Short Term Goal 3 (Week 3): Patient will ambulate >100 feet with mod A and +2 w/c follow for safety consistently using LRAD. PT Short Term Goal 4 (Week 3): Patient will initiate stair training for strengthening and endurance with a functional task.  Skilled Therapeutic Interventions/Progress Updates: Pt presents supine in bed and attempts to sit to opposite side of bed when asked to participate in therapy.  Pt requires assist to thread feet through shorts and attempts to bridge, but unable to complete.  Pt required max A for sup to sit w/ simple directions to " sit up".  Pt sat EOB w/ min A.  Pt transferred sit to stand w/ PT HHA and 1 UE to push up from bed.  Spouse present throughout and completed pulling pants up and buckling belt.  Pt stood w/ increased BOS and retropulsion, but does correct x 1 episode w/ cueing.  Pt amb x 4' in room to door w/ RW and Max A, but attempting to turn to left /turn around?  Pt required total A to sit into TIS.  Pt wheeled to central hallway and then attempted gait w/ RW.  Pt amb x 10' x 1 trial and then attempting to turn to left.  Pt amb from hallway into room and to bed w/ HHA x 2, still w/ insistent to turn to left.  Pt required max A for sit to supine.  Attempting to take off brief and incontinent of urine .  PT assists NT for rolling ide to side w/ max A for pericare and bed change.  Pt handed off to NT for completing care.     Therapy Documentation Precautions:  Precautions Precautions:  Fall Precaution Comments: cortrak, confusion, decreased ability to follow instructional commands Restrictions Weight Bearing Restrictions: No General:   Vital Signs:  Pain: does not appear to have pain.      Therapy/Group: Individual Therapy  Ladoris Gene 09/04/2020, 3:36 PM

## 2020-09-04 NOTE — Progress Notes (Signed)
Occupational Therapy TBI Note  Patient Details  Name: [name] [name] MRN: [id number] Date of Birth: 06-15-1940  Today's Date: 09/04/2020 OT Individual Time: 7425-9563 OT Individual Time Calculation (min): 75 min    Short Term Goals: Week 1:  OT Short Term Goal 1 (Week 1): Pt will attend to ADL task for 1 minute with mod cueing OT Short Term Goal 1 - Progress (Week 1): Progressing toward goal OT Short Term Goal 2 (Week 1): Pt will don shirt with max A OT Short Term Goal 2 - Progress (Week 1): Met OT Short Term Goal 3 (Week 1): Pt will use BUE to bathe UB with mod A OT Short Term Goal 3 - Progress (Week 1): Progressing toward goal  Skilled Therapeutic Interventions/Progress Updates:     Pt received in bed agreeable to to OT with wife present.   ADL:  Pt completes bathing with total A overall with pt perseverative on turning water on/off and attempting to stand to wash buttock with +2 A to keep pt seated Pt completes UB dressing with MAX A but pt able to thread head 75% and pull down back with max mulimodal cuing Pt completes LB dressing with +2 A sit to stand with pt perseverative on pulling socks off feet Pt completes footwear with total A Pt completes toileting with incontinent in brief at end of session  Pt completes shower/Tub transfer with MOD +2 A to ambualte into bathroom   Therapeutic activity 3 min visual scanning at BITS with 20 sec average to locate stimulus in 4 quadrants centrally located. Max multimodal cuing to locate on L 75% of time  Pt left at end of session in bed with exit alarm on, call light in reach and all needs met   Therapy Documentation Precautions:  Precautions Precautions: Fall Precaution Comments: cortrak, confusion, decreased ability to follow instructional commands Restrictions Weight Bearing Restrictions: No General:   Vital Signs: Therapy Vitals Temp: 98.7 F (37.1 C) Temp Source: Oral Pulse Rate: 91 Resp: 18 BP: (!) 148/94 Patient  Position (if appropriate): Lying Oxygen Therapy SpO2: 98 % O2 Device: Nasal Cannula Pain:   Agitated Behavior Scale: TBI  Observation Details Observation Environment: pt room Start of observation period - Date: 09/04/20 Start of observation period - Time: 0815 End of observation period - Date: 09/04/20 End of observation period - Time: 0930 Agitated Behavior Scale (DO NOT LEAVE BLANKS) Short attention span, easy distractibility, inability to concentrate: Present to an extreme degree Impulsive, impatient, low tolerance for pain or frustration: Present to an extreme degree Uncooperative, resistant to care, demanding: Present to a slight degree Violent and/or threatening violence toward people or property: Absent Explosive and/or unpredictable anger: Absent Rocking, rubbing, moaning, or other self-stimulating behavior: Present to an extreme degree Pulling at tubes, restraints, etc.: Present to a moderate degree Wandering from treatment areas: Present to a slight degree Restlessness, pacing, excessive movement: Present to a moderate degree Repetitive behaviors, motor, and/or verbal: Absent Rapid, loud, or excessive talking: Absent Sudden changes of mood: Absent Easily initiated or excessive crying and/or laughter: Absent Self-abusiveness, physical and/or verbal: Absent Agitated behavior scale total score: 29  ADL: ADL Eating: NPO Grooming: Dependent Where Assessed-Grooming: Bed level Upper Body Bathing: Dependent Where Assessed-Upper Body Bathing: Edge of bed Lower Body Bathing: Dependent Where Assessed-Lower Body Bathing: Bed level Upper Body Dressing: Dependent Lower Body Dressing: Dependent Where Assessed-Lower Body Dressing: Bed level Toileting: Unable to assess Where Assessed-Toileting: Glass blower/designer: Unable to assess Toilet Transfer Method: Unable  to assess Vision   Perception    Praxis   Exercises:   Other Treatments:     Therapy/Group: Individual  Therapy  Tonny Branch 09/04/2020, 6:55 AM

## 2020-09-05 LAB — GLUCOSE, CAPILLARY
Glucose-Capillary: 127 mg/dL — ABNORMAL HIGH (ref 70–99)
Glucose-Capillary: 130 mg/dL — ABNORMAL HIGH (ref 70–99)
Glucose-Capillary: 138 mg/dL — ABNORMAL HIGH (ref 70–99)
Glucose-Capillary: 173 mg/dL — ABNORMAL HIGH (ref 70–99)
Glucose-Capillary: 177 mg/dL — ABNORMAL HIGH (ref 70–99)
Glucose-Capillary: 98 mg/dL (ref 70–99)

## 2020-09-05 MED ORDER — METOPROLOL TARTRATE 25 MG/10 ML ORAL SUSPENSION
25.0000 mg | Freq: Two times a day (BID) | ORAL | Status: DC
  Administered 2020-09-05 – 2020-09-15 (×20): 25 mg
  Filled 2020-09-05 (×20): qty 10

## 2020-09-05 NOTE — Progress Notes (Addendum)
PROGRESS NOTE   Subjective/Complaints: Slept better last night by sleep chart. Resting when I saw him this morning. Aroused easily  ROS: Limited due to cognitive/behavioral    Objective:   No results found. No results for input(s): WBC, HGB, HCT, PLT in the last 72 hours.    No results for input(s): NA, K, CL, CO2, GLUCOSE, BUN, CREATININE, CALCIUM in the last 72 hours.   Intake/Output Summary (Last 24 hours) at 09/05/2020 0837 Last data filed at 09/05/2020 0738 Gross per 24 hour  Intake 0 ml  Output --  Net 0 ml         Physical Exam: Vital Signs Blood pressure 110/76, pulse 78, temperature 98.8 F (37.1 C), temperature source Oral, resp. rate 20, height 5\' 7"  (1.702 m), weight 86 kg, SpO2 97 %. Constitutional: No distress . Vital signs reviewed. HEENT: EOMI, oral membranes moist Neck: supple Cardiovascular: RRR without murmur. No JVD    Respiratory/Chest: CTA Bilaterally without wheezes or rales. Normal effort    GI/Abdomen: BS +, non-tender, non-distended Ext: no clubbing, cyanosis, or edema Psych: pleasant and cooperative  Musculoskeletal:    Cervical back: no axial or limb pain     Skin:    Comments: Some ecchymoses on arms B/L which are stable Neurological: awakened fairly easily. Right gaze preference. Still whispers only---a little louder today. Still distracted.       Assessment/Plan: 1. Functional deficits which require 3+ hours per day of interdisciplinary therapy in a comprehensive inpatient rehab setting. Physiatrist is providing close team supervision and 24 hour management of active medical problems listed below. Physiatrist and rehab team continue to assess barriers to discharge/monitor patient progress toward functional and medical goals  Care Tool:  Bathing    Body parts bathed by patient: Chest, Abdomen, Front perineal area, Face, Right arm, Left arm   Body parts bathed by helper:  Buttocks, Left upper leg, Right upper leg, Right lower leg, Left lower leg     Bathing assist Assist Level: Moderate Assistance - Patient 50 - 74%     Upper Body Dressing/Undressing Upper body dressing   What is the patient wearing?: Pull over shirt    Upper body assist Assist Level: Maximal Assistance - Patient 25 - 49%    Lower Body Dressing/Undressing Lower body dressing      What is the patient wearing?: Pants     Lower body assist Assist for lower body dressing: Total Assistance - Patient < 25%     Toileting Toileting    Toileting assist Assist for toileting: Total Assistance - Patient < 25%     Transfers Chair/bed transfer  Transfers assist     Chair/bed transfer assist level: 2 Helpers     Locomotion Ambulation   Ambulation assist   Ambulation activity did not occur: Safety/medical concerns  Assist level: 2 helpers Assistive device: Walker-rolling Max distance: 10   Walk 10 feet activity   Assist  Walk 10 feet activity did not occur: Safety/medical concerns  Assist level: 2 helpers Assistive device: Walker-rolling   Walk 50 feet activity   Assist Walk 50 feet with 2 turns activity did not occur: Safety/medical concerns  Assist level: Maximal  Assistance - Patient 25 - 49% Assistive device: Walker-rolling    Walk 150 feet activity   Assist Walk 150 feet activity did not occur: Safety/medical concerns         Walk 10 feet on uneven surface  activity   Assist Walk 10 feet on uneven surfaces activity did not occur: Safety/medical concerns         Wheelchair     Assist     Wheelchair activity did not occur: Safety/medical concerns         Wheelchair 50 feet with 2 turns activity    Assist    Wheelchair 50 feet with 2 turns activity did not occur: Safety/medical concerns       Wheelchair 150 feet activity     Assist  Wheelchair 150 feet activity did not occur: Safety/medical concerns       Blood  pressure 110/76, pulse 78, temperature 98.8 F (37.1 C), temperature source Oral, resp. rate 20, height 5\' 7"  (1.702 m), weight 86 kg, SpO2 97 %.  Medical Problem List and Plan: 1.  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  Impaired mobility -DVT/anticoagulation:  Eliquis restarted 6/21-pharmacy indicates increased cost to patient. More economical option is xarelto. Asked pharmacy to review with Mrs. Macera. She will want to speak with cardiology as well.              -antiplatelet therapy: Continue ASA 325 mg/day 3. Pain Management: N/A 4. Mood: LCSW to follow for evaluation and support.              -antipsychotic agents: N/A 5. Neuropsych: This patient is not capable of making decisions on his own behalf. 6. Skin/Wound Care: Routine pressure relief measures.  7. Fluids/Electrolytes/Nutrition: NPO with tube feeds.             -continue reduced water flushes to 100 cc every 4 hours -Na 139 6/27---recheck Monday 8. HTN: Monitor BP tid with SBP goal <140/90.              --continue losartan/day, cardura/day and Metoprolol every 6 hours Vitals:   09/04/20 2200 09/05/20 0356  BP: (!) 127/98 110/76  Pulse: 93 78  Resp: 17 20  Temp: (!) 97.5 F (36.4 C) 98.8 F (37.1 C)  SpO2: 96% 97%   Controlled 7/2 9. T2DM: Hgb A1C- 6.2. well controlled 7/2 CBG (last 3)  Recent Labs    09/05/20 0001 09/05/20 0354 09/05/20 0804  GLUCAP 127* 177* 130*    10. Lethargy: improving  -continue 200mg  amantadine -rx UTI   11. PAF: Monitor HR tid--eliquis restarted --SDH post fall 07/27/20             --continue Tikosyn bid             --appreciate pharmacy help to keep K+>4.0/Mg levels   7/2 EKG's yesterday demonstrated junctional rhythm 110's - 120's. Rate has been controlled  otherwise. Around 80bpm this morning.  Will check another EKG tomorrow AM. Increase metoprolol to 25mg  bid 12. Thoracic aortic aneurysm: Followed by [facility]. 13. Induced hypernatremia: Improving with increase water flushes  Stable, repeat labs Monday 14. Hypokalemia: increase repletion to 14meq BID   -  check labs Monday.   15. UTI: completed kelfex course 6/26.  -6/30 ucx with 100k pseudomonas---quinolone sensitive. Continue levaquin for 7 day course  16. Abdominal distention: better  -tolerating TF currently  -dc'ed miralax,     Spoke with wife reviewed chart   17.  Insomnia:   -pt was a night owl PTA, typically staying up to 0200 and waking at 1100 each day  -UTI playing a role in generalized confusion/neurosedation  -keppra also not helping with mental status  -improved with trazodone 75mg  qhs   18. Seizure activity: 90 second episode that self resolved on 6/24. Neuro consulted, appreciate recs, IV Ativan PRN, Keppra, seizure precautions  -none since keppra initiated  19. Dysphagia: pt remains NPO except for trials of D1,nectar with SLP   -mbs 6/29 with moderate oral dysphagia, some pharyngeal component as well  -hopefully with improved arousal and attention we can move towards diet LOS: 21 days A FACE TO FACE EVALUATION WAS PERFORMED  Meredith Staggers 09/05/2020, 8:37 AM

## 2020-09-05 NOTE — Progress Notes (Signed)
Physical Therapy Session Note  Patient Details  Name: [name] [name] MRN: [id number] Date of Birth: March 07, 1941  Today's Date: 09/05/2020 PT Individual Time: 1000-1108 + 1500-1527 min PT Individual Time Calculation (min): 68 min + 27 min  Short Term Goals: Week 3:  PT Short Term Goal 1 (Week 3): Patient will perform bed mobility with mod-min A of 1 person consistently. PT Short Term Goal 2 (Week 3): Patient will perform basic transfers with mod-min A of 1 person with min verbal cues. PT Short Term Goal 3 (Week 3): Patient will ambulate >100 feet with mod A and +2 w/c follow for safety consistently using LRAD. PT Short Term Goal 4 (Week 3): Patient will initiate stair training for strengthening and endurance with a functional task.   Skilled Therapeutic Interventions/Progress Updates:   1st session:   Pt received supine in bed to start. Wife at bedside and remained present throughout session to assist with pt participation and engagement. Pt appears to be in no pain. Pt with bilateral mittens and wrist restraints donned on arrival. Pt noted to be incontinent of urine and small BM. Required +2 totalA for pericare and brief change - he completed several bouts of rolling L<>R with HOB flat and needed +2 totalA for this as well. He had showed multiple efforts at pulling coretrack from nose during session and needed frequent blocking from doing so. Required totalA for donning shorts while supine in bed. Unable to bridge and needed to roll for this as well. Donned hospital socks with totalA for time management. Supine<>sit completed with +2 maxA with HOB flat. Pt demonstrating significant retropulsion and extensor tendencies. Coretrack valve leaking significantly and required RN assist to change this while pt sat EOB. He required +2 totalA for removing dirty t-shirt and for donning a new clean one. He completed a stand<>pivot transfer with +2 maxA via bilateral HHA to his TIS w/c - great difficulty with  producing stepping patterns to turn to sit. Wheeled to main rehab hallway to focus on functional gait training. He required a heavy +2 maxA to stand due to continued retropulsion. At first, introduced RW to assist with gait but pt distracted by this and had great difficulty managing it. He continued to have pushing/retropulsion tendencies in standing and with bilateral HHA with +2 mod/maxA he ambulated ~67f in hallway and into rehab gym. Somewhat impulsive with sitting and needed close chair follow during gait. Wheeled to ortho gym to reduce distractions from other patients. Completed stand<>pivot transfer to mat table with +2 maxA with similar deficits as above. At this point, pt demonstrating somewhat perseverative movement patterns with his hands - appears as if he was attempting to remove something from his hand? Unable to determine is visual hallucinations vs motor movement patterns from past. Wife reports pt enjoyed fishing and tieing knots so perhaps this was what he was doing. Attempted to engage pt in other functional activities such as hanging horsehoes on back of mirror in standing position, tossing horseshoes in seated position, reaching for objects to promote forward lean, and using large therapy ball to promote anterior pelvic tilt to promote anterior weight shift. Pt had significant difficulty following simple basic commands and had difficulty initiating standing with +2 totalA. Eventually, able ot complete stand<>pivot maxA +2 from mat table to TIS w/c. Returned to his room in TIS w/c and applied bilateral wrist restraints and seat belt in his w/c. Pt remained reclined in TIS w/c with these safety measures in place and wife remained  at bedside, RN made aware as well at end of session. Upcoming OT session in ~30 minutes.   2nd session: Pt received supine in bed, sleeping soundly on arrival - he required max verbal stimulation to awaken. His wife is at bedside to assist with  encouragement/participation throughout session. There were multiple efforts at sitting EOB but pt resistive to movements and would attempt to lie back down. Multiple attempts to redirect and encourage pt to mobilize out of bed pt continued to lack ability due to retropulsion back into bed. Ultimately, able to get pt sitting up EOB with modA with use of bed features and attempted to mobilize with transfers but pt continued to attempt to lay back in bed. Repositioned pt in bed with supine scooting and called for NT to assist as pt noted to be incontinent of bowel and bladder. Removed shorts and brief with totalA and then handoff of care to 2 NT's to provide pericare and brief change. All needs met.   Therapy Documentation Precautions:  Precautions Precautions: Fall Precaution Comments: cortrak, confusion, decreased ability to follow instructional commands Restrictions Weight Bearing Restrictions: No General:    Therapy/Group: Individual Therapy  Alger Simons 09/05/2020, 7:32 AM

## 2020-09-05 NOTE — Progress Notes (Signed)
Occupational Therapy Session Note  Patient Details  Name: [name] [name] MRN: [id number] Date of Birth: May 05, 1940  Today's Date: 09/05/2020 OT Individual Time: 1135-1208 OT Individual Time Calculation (min): 33 min   Session 2: OT Individual Time: 1405-1430 OT Individual Time Calculation (min): 25 min    Short Term Goals:  Week 3:  OT Short Term Goal 1 (Week 3): Pt will complete stand pivot transfer with mod A OT Short Term Goal 2 (Week 3): Pt will complete UB bathing with min A OT Short Term Goal 3 (Week 3): Pt will sequence donning shirt with no more than mod cueing and mod A  Skilled Therapeutic Interventions/Progress Updates:    Session 1: Pt received in TIS w/c, restless but alert and responsive. He had improved session with command following, sequencing, and motor planning. He completed standing level brief change at the sink with only CGA to stand while OT doffed and donned new brief. He completed grooming tasks with min cueing as well, requiring min facilitation to initiate/terminate. Pt completed a stand pivot transfer back to the bed with mod A. He was left supine with all needs met, bed alarm set. Restraints and mitts on.   Session 2: Pt received supine alert and softly answering questions. Mod cueing to initiate LB dressing to doff shorts to check for incontinence. He was able to bridge hips slightly with max multimodal cueing and mod A. Pt transferred to EOB with mod A. He stood with min A after mod cueing and immediately began pulling at his pants. Gathered urinal quickly and pt voided in urinal/sprayed room and OT. Pt finished toileting and was assisted in functional mobility to the sink with mod +2 assist. He stated "brush my teeth" and was given a toothbrush with very little water and no toothpaste d/t NPO status. He initiated oral care and thoroughly completed with mod A for standing balance. Attempted to complete functional mobility in the hallway following but pt was too  distracted by internal/external stimuli and turned around to walk back into his room. Max multimodal cueing for direction following. Pt was left supine with all needs met, restraints and mitts on. Bed alarm set.   Therapy Documentation Precautions:  Precautions Precautions: Fall Precaution Comments: cortrak, confusion, decreased ability to follow instructional commands Restrictions Weight Bearing Restrictions: No   Therapy/Group: Individual Therapy  Curtis Sites 09/05/2020, 7:27 AM

## 2020-09-06 LAB — GLUCOSE, CAPILLARY
Glucose-Capillary: 105 mg/dL — ABNORMAL HIGH (ref 70–99)
Glucose-Capillary: 138 mg/dL — ABNORMAL HIGH (ref 70–99)
Glucose-Capillary: 143 mg/dL — ABNORMAL HIGH (ref 70–99)
Glucose-Capillary: 152 mg/dL — ABNORMAL HIGH (ref 70–99)
Glucose-Capillary: 156 mg/dL — ABNORMAL HIGH (ref 70–99)

## 2020-09-06 MED ORDER — QUETIAPINE FUMARATE 25 MG PO TABS
25.0000 mg | ORAL_TABLET | Freq: Once | ORAL | Status: AC
  Administered 2020-09-06: 25 mg via ORAL
  Filled 2020-09-06: qty 1

## 2020-09-06 NOTE — Progress Notes (Signed)
PROGRESS NOTE   Subjective/Complaints: Fell asleep after midnight. Was asleep when I rounded on him at 0700 this morning. No new problems reported.   ROS: Limited due to cognitive/behavioral    Objective:   No results found. No results for input(s): WBC, HGB, HCT, PLT in the last 72 hours.    No results for input(s): NA, K, CL, CO2, GLUCOSE, BUN, CREATININE, CALCIUM in the last 72 hours.   Intake/Output Summary (Last 24 hours) at 09/06/2020 0759 Last data filed at 09/06/2020 0746 Gross per 24 hour  Intake 0 ml  Output --  Net 0 ml         Physical Exam: Vital Signs Blood pressure (!) 155/90, pulse 95, temperature 98 F (36.7 C), temperature source Oral, resp. rate (!) 22, height 5\' 7"  (1.702 m), weight 86 kg, SpO2 99 %. Constitutional: No distress . Vital signs reviewed. HEENT: EOMI, oral membranes moist Neck: supple Cardiovascular: RRR without murmur. No JVD    Respiratory/Chest: CTA Bilaterally without wheezes or rales. Normal effort    GI/Abdomen: BS +, non-tender, non-distended Ext: no clubbing, cyanosis, or edema Psych: flat  Musculoskeletal:    Cervical back: no axial or limb pain     Skin:    Comments: Some ecchymoses on arms B/L which are stable Neurological: aroused easy. Still with right gaze preference. Distracted. Moves right side preferentially over left.     Assessment/Plan: 1. Functional deficits which require 3+ hours per day of interdisciplinary therapy in a comprehensive inpatient rehab setting. Physiatrist is providing close team supervision and 24 hour management of active medical problems listed below. Physiatrist and rehab team continue to assess barriers to discharge/monitor patient progress toward functional and medical goals  Care Tool:  Bathing    Body parts bathed by patient: Chest, Abdomen, Front perineal area, Face, Right arm, Left arm   Body parts bathed by helper: Buttocks,  Left upper leg, Right upper leg, Right lower leg, Left lower leg     Bathing assist Assist Level: Moderate Assistance - Patient 50 - 74%     Upper Body Dressing/Undressing Upper body dressing   What is the patient wearing?: Pull over shirt    Upper body assist Assist Level: Maximal Assistance - Patient 25 - 49%    Lower Body Dressing/Undressing Lower body dressing      What is the patient wearing?: Pants     Lower body assist Assist for lower body dressing: Total Assistance - Patient < 25%     Toileting Toileting    Toileting assist Assist for toileting: Total Assistance - Patient < 25%     Transfers Chair/bed transfer  Transfers assist     Chair/bed transfer assist level: 2 Helpers     Locomotion Ambulation   Ambulation assist   Ambulation activity did not occur: Safety/medical concerns  Assist level: 2 helpers Assistive device: Walker-rolling Max distance: 10   Walk 10 feet activity   Assist  Walk 10 feet activity did not occur: Safety/medical concerns  Assist level: 2 helpers Assistive device: Walker-rolling   Walk 50 feet activity   Assist Walk 50 feet with 2 turns activity did not occur: Safety/medical concerns  Assist  level: Maximal Assistance - Patient 25 - 49% Assistive device: Walker-rolling    Walk 150 feet activity   Assist Walk 150 feet activity did not occur: Safety/medical concerns         Walk 10 feet on uneven surface  activity   Assist Walk 10 feet on uneven surfaces activity did not occur: Safety/medical concerns         Wheelchair     Assist     Wheelchair activity did not occur: Safety/medical concerns         Wheelchair 50 feet with 2 turns activity    Assist    Wheelchair 50 feet with 2 turns activity did not occur: Safety/medical concerns       Wheelchair 150 feet activity     Assist  Wheelchair 150 feet activity did not occur: Safety/medical concerns       Blood pressure  (!) 155/90, pulse 95, temperature 98 F (36.7 C), temperature source Oral, resp. rate (!) 22, height 5\' 7"  (1.702 m), weight 86 kg, SpO2 99 %.  Medical Problem List and Plan: 1.  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  Impaired mobility -DVT/anticoagulation:  Eliquis restarted 6/21-pharmacy indicates increased cost to patient. More economical option is xarelto. Asked pharmacy to review with Mrs. Reczek. She will want to speak with cardiology as well.              -antiplatelet therapy: Continue ASA 325 mg/day 3. Pain Management: N/A 4. Mood: LCSW to follow for evaluation and support.              -antipsychotic agents: N/A 5. Neuropsych: This patient is not capable of making decisions on his own behalf. 6. Skin/Wound Care: Routine pressure relief measures.  7. Fluids/Electrolytes/Nutrition: NPO with tube feeds.             -continue reduced water flushes to 100 cc every 4 hours -Na 139 6/27---recheck Tuesday 8. HTN: Monitor BP tid with SBP goal <140/90.              --continue losartan/day, cardura/day and Metoprolol every 6 hours Vitals:   09/05/20 1921 09/06/20 0420  BP: 122/85 (!) 155/90  Pulse: 64 95  Resp: 20 (!) 22  Temp: 97.7 F (36.5 C) 98 F (36.7 C)  SpO2: 94% 99%  Generally has been controlled. Some elevation last 24. Observe today 7/3 9. T2DM: Hgb A1C- 6.2. well controlled 7/2 CBG (last 3)  Recent Labs    09/05/20 1956 09/06/20 0007 09/06/20 0414  GLUCAP 98 143* 156*    10. Lethargy: improving  -continue 200mg  amantadine -rx UTI   11. PAF: Monitor HR tid--eliquis restarted --SDH post fall 07/27/20             --continue Tikosyn bid             --appreciate pharmacy help to keep K+>4.0/Mg levels   7/3 EKG's 7/1  demonstrated junctional rhythm 110's - 120's. Rate has been controlled otherwise. Around 80bpm on my evals this weekend -ordered EKG today which demonstrates NSR 88bpm  -continue to observe 12. Thoracic aortic aneurysm: Followed by [facility]. 13. Induced hypernatremia: Improving  with increase water flushes  Stable, repeat labs Tuesday 14. Hypokalemia: increase repletion to 27meq BID   -check labs Tuesday.   15. UTI: completed kelfex course 6/26.  -6/30 ucx with 100k pseudomonas---quinolone sensitive. Continue levaquin for 7 day course  7/3 mental status has improved with rx 16. Abdominal distention: resolved  -tolerating TF currently  -dc'ed miralax,     Spoke with wife reviewed chart   17.  Insomnia:   -pt was a night owl PTA, typically staying up to 0200 and waking at 1100 each day  -has improved somewhat with trazodone 75mg  qhs--continue 18. Seizure activity: 90 second episode that self resolved on 6/24. Neuro consulted, appreciate recs, IV Ativan PRN, Keppra, seizure precautions  -none since keppra initiated  19. Dysphagia: pt remains NPO except for trials of D1,nectar with SLP   -mbs 6/29 with moderate oral dysphagia, some pharyngeal component as well  -advance per SLP LOS: 22 days A FACE TO [place] 09/06/2020, 7:59 AM

## 2020-09-07 LAB — GLUCOSE, CAPILLARY
Glucose-Capillary: 110 mg/dL — ABNORMAL HIGH (ref 70–99)
Glucose-Capillary: 113 mg/dL — ABNORMAL HIGH (ref 70–99)
Glucose-Capillary: 124 mg/dL — ABNORMAL HIGH (ref 70–99)
Glucose-Capillary: 130 mg/dL — ABNORMAL HIGH (ref 70–99)
Glucose-Capillary: 131 mg/dL — ABNORMAL HIGH (ref 70–99)
Glucose-Capillary: 167 mg/dL — ABNORMAL HIGH (ref 70–99)
Glucose-Capillary: 180 mg/dL — ABNORMAL HIGH (ref 70–99)

## 2020-09-07 NOTE — Progress Notes (Addendum)
Physical Therapy Session Note  Patient Details  Name: [name] [name] MRN: [id number] Date of Birth: [birth year]  Today's Date: 09/07/2020 PT Individual Time: 0900-1000  1430-1500 PT Individual Time Calculation (min): 60 min and 30 min  Short Term Goals: Week 1:  PT Short Term Goal 1 (Week 1): Pt will perform supine<>sit with mod-max A of 1 person consistently. PT Short Term Goal 1 - Progress (Week 1): Met PT Short Term Goal 2 (Week 1): Pt will perform sit<>stands using LRAD with mod A +2 PT Short Term Goal 2 - Progress (Week 1): Met PT Short Term Goal 3 (Week 1): Pt will perform bed<>chair transfers using LRAD with max A of 1 person PT Short Term Goal 3 - Progress (Week 1): Met PT Short Term Goal 4 (Week 1): Patient will initiation gait training PT Short Term Goal 4 - Progress (Week 1): Met PT Short Term Goal 5 (Week 1): Patient will perform sitting balance with mod A consistently PT Short Term Goal 5 - Progress (Week 1): Progressing toward goal Week 2:  PT Short Term Goal 1 (Week 2): Patient will perform sitting balance with mod A consistently. PT Short Term Goal 1 - Progress (Week 2): Met PT Short Term Goal 2 (Week 2): Patient will tolerate safely sitting OOB >30 min 3/7 days this week. PT Short Term Goal 2 - Progress (Week 2): Met PT Short Term Goal 3 (Week 2): Patient will perform basic transfers with mod A and mod cues consistently. PT Short Term Goal 3 - Progress (Week 2): Met PT Short Term Goal 4 (Week 2): Patient will ambulate >100 ft with mod A using LRAD consistently. PT Short Term Goal 4 - Progress (Week 2): Progressing toward goal Week 3:  PT Short Term Goal 1 (Week 3): Patient will perform bed mobility with mod-min A of 1 person consistently. PT Short Term Goal 2 (Week 3): Patient will perform basic transfers with mod-min A of 1 person with min verbal cues. PT Short Term Goal 3 (Week 3): Patient will ambulate >100 feet with mod A and +2 w/c follow for safety consistently using  LRAD. PT Short Term Goal 4 (Week 3): Patient will initiate stair training for strengthening and endurance with a functional task.  Skilled Therapeutic Interventions/Progress Updates:   AM session Pt gives no indication of pain during session.  Unable to verbally respond when therapist enquires. Pt initially supine, incontinent in brief of bowel and bladder, wife at side and voicing frustration w/this not being addressed prior to session/feels it is "waste of therapy time". Therapist quickly cleans pt/dresses total assist for time management. Supine to sit w/max assist, additional time to initiate, perseverative hand motions as if tying something, occasionally reaches for NG tube. Sit to stand w/max assist, +2 HHA to guide forward momentum, cues, time to initiate.  Turns in squatting posture as if attempting to transfer to chair, no chair in area.  Therapist continues to guide via manual facilitation of upright posute and forward progression gradually resulting in improved posture, gait pattern, and forward progression.  At times pt ambulates w/as little as +2 min assist, at other times max assist as he is distracted by inner or external distractions disrupting his sequencing/continuity of gait. Gait trials included the following distances: 151ft, 315ft, 69ft/turn/50ft.  Wife follows w/wc during all gait trials which is very helpful.  Pt transported to gym for continued session.  Attempted Sit to stand at hi/lo table but mult efforts unsuccessful despite additional time, pt  unable to process task.  Attempted Sit to stand away from table as if w/gait/+2 HHA and additional attempts also fail.  Seated at hi/lo table, therapist used fruit bucket and presented pt w/2 brightly colored pieces of fruit.  When asked to select one named fruit, pt unable to choose correct fruit but does take one and return to bowl 50% of time as instructed. At other times, pt takes fruit and rubs face or drops to floor.  MD in to  see pt.  Therapist guards pt in wc as he spontaneously reaches forward for objects he apprears to be hallucinating.  Pt transported back to room at end of session. Pt left oob in wc w//seatbelt fastened, mits on hands, wife in room, nursing notified that no seat belt alarm available in room, and needs in reach  wife at side and agreed to supervise pt while nursing notified.  PM SESSION pAIN: Pt gives no indication of pain during session.  Unable to verbally respond when therapist  Inquires.   Pt initially sleeping w/wrist and mitt restraints fastened, wife at bedside. Pt wakens easily, R gaze preference. Pt rolls to L w/max assist, tactile cues, additional time.  Attempted side to sit x 4 but pt resisting, gripping bed behind him w/R hand and pulling himself back into supine.  Pt given time between each effort.  On 5th attempt, pt does come to sitting w/max assist.  In sitting pt crosses L foot over right and removes sock.  When sock handed back to pt, pt does not take sock from therapist.  Pt picks up gait belt from bed and manipulates in hands in fidgeting manner.  Therapist placed belt on floor and pt reached forward to retrieve belt w/cga.  Attempts to initiate stand pivot transfer to wc met w/resistance, pt again attempts to lie down in bed.  Therapist allowed pt to return supine, pt rolls to L sidelying w/supervision.  Total assist to scoot in bed.  Therapist allowed pt break from restraints for several min while awaiting next therapist.  Wife asking therapist "when will all of this get better?"  When asked to clarify she explained she wanted to know when he would be able to get up and move more easily.  Discussed complex nature of his neurological insults and difficulty predicting rate of recovery as well as predicted functional outcome.  Wife appears to be somewhat in denial and would benefit from continued education and discussion regarding likelihood of significant longterm deficits. Pt handed  directly off to OT and informed of restraints left off by this therapist in anticipation of handoff. Therapy Documentation Precautions:  Precautions Precautions: Fall Precaution Comments: cortrak, confusion, decreased ability to follow instructional commands Restrictions Weight Bearing Restrictions: No     Therapy/Group: Individual Therapy Callie Fielding, [place] 09/07/2020, 12:44 PM

## 2020-09-07 NOTE — Progress Notes (Signed)
PROGRESS NOTE   Subjective/Complaints:  Pt amb long distance with PT Cont to fidget with BUE   ROS: Limited due to cognitive/behavioral    Objective:   No results found. No results for input(s): WBC, HGB, HCT, PLT in the last 72 hours.    No results for input(s): NA, K, CL, CO2, GLUCOSE, BUN, CREATININE, CALCIUM in the last 72 hours.  No intake or output data in the 24 hours ending 09/07/20 0921        Physical Exam: Vital Signs Blood pressure 139/79, pulse 73, temperature 97.9 F (36.6 C), temperature source Oral, resp. rate 18, height 5\' 7"  (1.702 m), weight 86 kg, SpO2 100 %.  General: No acute distress Mood and affect are appropriate Heart: Regular rate and rhythm no rubs murmurs or extra sounds Lungs: Clear to auscultation, breathing unlabored, no rales or wheezes Abdomen: Positive bowel sounds, soft nontender to palpation, nondistended Extremities: No clubbing, cyanosis, or edema Skin: No evidence of breakdown, no evidence of rash   Psych: flat  Musculoskeletal:    Cervical back: no axial or limb pain     Skin:    Comments: Some ecchymoses on arms B/L which are stable Neurological: aroused easy. Still with right gaze preference. Distracted. Moves right side preferentially over left. Pt does extend legs antigravity to command Amb with forward flexed shuffling gait     Assessment/Plan: 1. Functional deficits which require 3+ hours per day of interdisciplinary therapy in a comprehensive inpatient rehab setting. Physiatrist is providing close team supervision and 24 hour management of active medical problems listed below. Physiatrist and rehab team continue to assess barriers to discharge/monitor patient progress toward functional and medical goals  Care Tool:  Bathing    Body parts bathed by patient: Chest, Abdomen, Front perineal area, Face, Right arm, Left arm   Body parts bathed by helper:  Buttocks, Left upper leg, Right upper leg, Right lower leg, Left lower leg     Bathing assist Assist Level: Moderate Assistance - Patient 50 - 74%     Upper Body Dressing/Undressing Upper body dressing   What is the patient wearing?: Pull over shirt    Upper body assist Assist Level: Maximal Assistance - Patient 25 - 49%    Lower Body Dressing/Undressing Lower body dressing      What is the patient wearing?: Pants     Lower body assist Assist for lower body dressing: Total Assistance - Patient < 25%     Toileting Toileting    Toileting assist Assist for toileting: Total Assistance - Patient < 25%     Transfers Chair/bed transfer  Transfers assist     Chair/bed transfer assist level: 2 Helpers     Locomotion Ambulation   Ambulation assist   Ambulation activity did not occur: Safety/medical concerns  Assist level: 2 helpers Assistive device: Walker-rolling Max distance: 10   Walk 10 feet activity   Assist  Walk 10 feet activity did not occur: Safety/medical concerns  Assist level: 2 helpers Assistive device: Walker-rolling   Walk 50 feet activity   Assist Walk 50 feet with 2 turns activity did not occur: Safety/medical concerns  Assist level: Maximal Assistance -  Patient 25 - 49% Assistive device: Walker-rolling    Walk 150 feet activity   Assist Walk 150 feet activity did not occur: Safety/medical concerns         Walk 10 feet on uneven surface  activity   Assist Walk 10 feet on uneven surfaces activity did not occur: Safety/medical concerns         Wheelchair     Assist     Wheelchair activity did not occur: Safety/medical concerns         Wheelchair 50 feet with 2 turns activity    Assist    Wheelchair 50 feet with 2 turns activity did not occur: Safety/medical concerns       Wheelchair 150 feet activity     Assist  Wheelchair 150 feet activity did not occur: Safety/medical concerns       Blood  pressure 139/79, pulse 73, temperature 97.9 F (36.6 C), temperature source Oral, resp. rate 18, height 5\' 7"  (1.702 m), weight 86 kg, SpO2 100 %.  Medical Problem List and Plan: 1.  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  Impaired mobility -DVT/anticoagulation:  Eliquis restarted 6/21-pharmacy indicates increased cost to patient. More economical option is xarelto. Asked pharmacy to review with Mrs. Menton. She will want to speak with cardiology as well.              -antiplatelet therapy: Continue ASA 325 mg/day 3. Pain Management: N/A 4. Mood: LCSW to follow for evaluation and support.              -antipsychotic agents: N/A 5. Neuropsych: This patient is not capable of making decisions on his own behalf. 6. Skin/Wound Care: Routine pressure relief measures.  7. Fluids/Electrolytes/Nutrition: NPO with tube feeds.             -continue reduced water flushes to 100 cc every 4 hours -Na 139 6/27---recheck Tuesday 8. HTN: Monitor BP tid with SBP goal <140/90.              --continue losartan/day, cardura/day and Metoprolol every 6 hours Vitals:   09/07/20 0422 09/07/20 0602  BP: 139/79   Pulse: 73   Resp: (!) 22 18  Temp: 97.9 F (36.6 C)   SpO2: 100%   7/4 controlled  9. T2DM: Hgb A1C- 6.2. well controlled 7/2 CBG (last 3)  Recent Labs    09/07/20 0000 09/07/20 0420 09/07/20 0840  GLUCAP 130* 167* 180*   Controlled 7/4  10. Lethargy: improving  -continue 200mg  amantadine -rx UTI   11. PAF: Monitor HR tid--eliquis restarted --SDH post fall 07/27/20             --continue Tikosyn bid             --appreciate pharmacy help to keep K+>4.0/Mg levels   7/3 EKG's 7/1 demonstrated junctional rhythm 110's - 120's. Rate has been  controlled otherwise. Around 80bpm on my evals this weekend -ordered EKG today which demonstrates NSR 88bpm  -continue to observe 12. Thoracic aortic aneurysm: Followed by [facility]. 13. Induced hypernatremia: Improving with increase water flushes  Stable, repeat labs Tuesday 14. Hypokalemia: increase repletion to 76meq BID   -  check labs Tuesday.   15. UTI: completed kelfex course 6/26.  -6/30 ucx with 100k pseudomonas---quinolone sensitive. Continue levaquin for 7 day course  7/3 mental status has improved with rx 16. Abdominal distention: resolved  -tolerating TF currently  -dc'ed miralax,     Spoke with wife reviewed chart   17.  Insomnia:   -pt was a night owl PTA, typically staying up to 0200 and waking at 1100 each day  -has improved somewhat with trazodone 75mg  qhs--continue 18. Seizure activity: 90 second episode that self resolved on 6/24. Neuro consulted, appreciate recs, IV Ativan PRN, Keppra, seizure precautions  -none since keppra initiated  19. Dysphagia: pt remains NPO except for trials of D1,nectar with SLP   -mbs 6/29 with moderate oral dysphagia, some pharyngeal component as well  -advance per SLP LOS: 23 days A FACE TO [place] E Laytoya Ion 09/07/2020, 9:21 AM

## 2020-09-07 NOTE — Progress Notes (Signed)
Speech Language Pathology Daily Session Note  Patient Details  Name: [name] [name] MRN: [id number] Date of Birth: Dec 11, 1940  Today's Date: 09/07/2020 SLP Individual Time: 1300-1345 SLP Individual Time Calculation (min): 45 min  Short Term Goals: Week 3: SLP Short Term Goal 1 (Week 3): Pt will consume therapeutic trials of Dys. 1 textures wiht nectar-thick liquids via tsp with minimal overt s/s of aspiration and min assist for use of swallowing precautions over 2 sessions prior to diet initiation. SLP Short Term Goal 2 (Week 3): Pt will maintain alertness for periods of 2 minutes to participate in basic tasks with max assist multimodal cues consistently over 3 consecutive sessions. SLP Short Term Goal 3 (Week 3): Pt will use multimodal means of communication (gestures, head nod/shake, pointing, verbalizing, vocalizing) to convey his immediate needs and wants to caregivers with mod assist multimodal cues. SLP Short Term Goal 4 (Week 3): Patient will utilize an increased vocal intensity at the phrase level to achieve ~75% intelligibility with Max verbal cues. SLP Short Term Goal 5 (Week 3): Patient will initiate functional tasks with Max A multimodal cues.  Skilled Therapeutic Interventions: Skilled ST intervention performed with focus on dysphagia and cognition. Patient was sleeping on arrival with spouse at bedside. Spouse reported increased fatigue following an eventful morning of therapy. Patient aroused with verbal and tactile stimulation and maintained alertness for 5 minute intervals prior to requiring additional re-direction. Patient exhibited minimal verbal expression today during attempts for communication exchange regarding personal preferences, and basic wants/needs requiring max A multimodal cues and extra time to respond. Often responded with very subtle head nods and occasional one word response. SLP facilitated PO trials with max A verbal and tactile cues for initiation of self-feeding of  Dys 1 textures (vanilla yogurt) via tsp and honey thick liquids via cup. Patient consumed snack without overt s/sx of aspiration. Throat clear noted x1 with honey thick liquids. Lingual pumping was observed with Dys 1 textures during 3/6 occasions, suspect attributed to fatigue as patient exhibited decreased arousal toward end of session and PO trials were discontinued for safety. Recommend ongoing trials with SLP only. Patient was left upright in bed with alarm activated, restraints in place, and needs within reach. Continue per ST POC.    Pain Pain Assessment Pain Scale: 0-10 Pain Score: 0-No pain  Therapy/Group: Individual Therapy  Patty Sermons 09/07/2020, 4:16 PM

## 2020-09-07 NOTE — Progress Notes (Signed)
Occupational Therapy Session Note  Patient Details  Name: [name] [name] MRN: [id number] Date of Birth: 18-Nov-1940  Today's Date: 09/07/2020 OT Individual Time: 1500-1530 OT Individual Time Calculation (min): 30 min    Short Term Goals: Week 3:  OT Short Term Goal 1 (Week 3): Pt will complete stand pivot transfer with mod A OT Short Term Goal 2 (Week 3): Pt will complete UB bathing with min A OT Short Term Goal 3 (Week 3): Pt will sequence donning shirt with no more than mod cueing and mod A  Skilled Therapeutic Interventions/Progress Updates:    Patient in bed finishing PT session.  Wife present.  He appears extremely fatigued but indicates that he would like to sit up.  Occ one word responses this session.  Side lying to sitting edge of bed with max A.  He requires min to max A to maintain sitting edge of bed, distracted by objects in environment, pushing with right leg/trunk toward left side.  Completed oral care with suction brush HOH.  Attempted to engage in other functional activity but he continues to struggle with sitting this afternoon.  Sit to supine with max A.  Max A/dep to doff pants and change wet brief.  Rolling to left side with min/mod A, to right max A.  He remained in bed at close of session, HOB 30, bed alarm set, mitts on and wrist restraints on.    Therapy Documentation Precautions:  Precautions Precautions: Fall Precaution Comments: cortrak, confusion, decreased ability to follow instructional commands Restrictions Weight Bearing Restrictions: No   Therapy/Group: Individual Therapy  Carlos Levering 09/07/2020, 7:48 AM

## 2020-09-07 NOTE — Progress Notes (Signed)
Occupational Therapy Session Note  Patient Details  Name: [name] [name] MRN: [id number] Date of Birth: Jan 22, 1941  Today's Date: 09/07/2020 OT Individual Time: 2641-5830 OT Individual Time Calculation (min): 60 min    Short Term Goals: Week 3:  OT Short Term Goal 1 (Week 3): Pt will complete stand pivot transfer with mod A OT Short Term Goal 2 (Week 3): Pt will complete UB bathing with min A OT Short Term Goal 3 (Week 3): Pt will sequence donning shirt with no more than mod cueing and mod A   Skilled Therapeutic Interventions/Progress Updates:    Pt received sitting in TIS restless with his wife present. Session focused on command following with multimodal cueing, focused attention to task, automatic motor planning/sequencing, and L attention/midline orientation. Pt required max multimodal cueing to follow commands during ADL sitting at the sink. Environment was adapted to minimize distractions but pt still incredibly internally/externally distracted. Increased cueing for attention to L side of body during bathing/dressing. Pt required max A overall for ADLs. When cues were processed, pt was able to stand with CGA at the sink. Sitting balance required max A d/t pt fatigue and pt constantly trying to find a pillow to lean back on. Pt was able to follow ball throwing task pattern with max cueing- verabally and auditory to locate stimuli at midline and slightly L visual field. By the end of the session pt was increasingly fatigued and actively resisting transfer back to w/c. With increased time and cueing he eventually returned to w/c, max +2 assist and then to bed. NG tube running, all needs within reach and B wrist restraints/mitts on. Bed alarm set.   Therapy Documentation Precautions:  Precautions Precautions: Fall Precaution Comments: cortrak, confusion, decreased ability to follow instructional commands Restrictions Weight Bearing Restrictions: No   Therapy/Group: Individual  Therapy  Curtis Sites 09/07/2020, 6:48 AM

## 2020-09-08 LAB — BASIC METABOLIC PANEL
Anion gap: 5 (ref 5–15)
BUN: 28 mg/dL — ABNORMAL HIGH (ref 8–23)
CO2: 27 mmol/L (ref 22–32)
Calcium: 8.9 mg/dL (ref 8.9–10.3)
Chloride: 106 mmol/L (ref 98–111)
Creatinine, Ser: 0.92 mg/dL (ref 0.61–1.24)
GFR, Estimated: >60 mL/min (ref >60)
Glucose, Bld: 156 mg/dL — ABNORMAL HIGH (ref 70–99)
Potassium: 3.9 mmol/L (ref 3.5–5.1)
Sodium: 138 mmol/L (ref 135–145)

## 2020-09-08 LAB — CBC
HCT: 35.8 % — ABNORMAL LOW (ref 39.0–52.0)
Hemoglobin: 11.6 g/dL — ABNORMAL LOW (ref 13.0–17.0)
MCH: 31 pg (ref 26.0–34.0)
MCHC: 32.4 g/dL (ref 30.0–36.0)
MCV: 95.7 fL (ref 80.0–100.0)
Platelets: 197 10*3/uL (ref 150–400)
RBC: 3.74 MIL/uL — ABNORMAL LOW (ref 4.22–5.81)
RDW: 14.1 % (ref 11.5–15.5)
WBC: 11.8 10*3/uL — ABNORMAL HIGH (ref 4.0–10.5)
nRBC: 0 % (ref 0.0–0.2)

## 2020-09-08 LAB — GLUCOSE, CAPILLARY
Glucose-Capillary: 152 mg/dL — ABNORMAL HIGH (ref 70–99)
Glucose-Capillary: 120 mg/dL — ABNORMAL HIGH (ref 70–99)
Glucose-Capillary: 180 mg/dL — ABNORMAL HIGH (ref 70–99)
Glucose-Capillary: 135 mg/dL — ABNORMAL HIGH (ref 70–99)
Glucose-Capillary: 145 mg/dL — ABNORMAL HIGH (ref 70–99)

## 2020-09-08 LAB — MAGNESIUM: Magnesium: 2 mg/dL (ref 1.7–2.4)

## 2020-09-08 MED ORDER — FREE WATER
150.0000 mL | Status: DC
  Administered 2020-09-08 – 2020-09-14 (×35): 150 mL

## 2020-09-08 MED ORDER — HYDROCORTISONE 1 % EX CREA: TOPICAL_CREAM | Freq: Two times a day (BID) | CUTANEOUS | 0 refills | Status: DC | PRN

## 2020-09-08 MED ORDER — TRAZODONE HCL 50 MG PO TABS
100.0000 mg | ORAL_TABLET | Freq: Every day | ORAL | Status: DC
  Administered 2020-09-08 – 2020-09-14 (×7): 100 mg via NASOGASTRIC
  Filled 2020-09-08 (×8): qty 2

## 2020-09-08 MED ORDER — MAGNESIUM SULFATE 2 GM/50ML IV SOLN
2.0000 g | Freq: Once | INTRAVENOUS | Status: AC
  Administered 2020-09-08: 2 g via INTRAVENOUS
  Filled 2020-09-08: qty 50

## 2020-09-08 MED ORDER — POTASSIUM CHLORIDE 20 MEQ PO PACK
40.0000 meq | PACK | Freq: Once | ORAL | Status: AC
  Administered 2020-09-08: 40 meq
  Filled 2020-09-08: qty 2

## 2020-09-08 NOTE — Progress Notes (Signed)
Pharmacy: Dofetilide (Tikosyn) - Follow Up Assessment and Electrolyte Replacement  Pharmacy consulted to assist in monitoring and replacing electrolytes in this 80 y.o. male admitted on 08/15/2020 on dofetilide.   Labs:    Component Value Date/Time   K 3.9 09/08/2020 0344   K 4.1 11/26/2013 0000   MG 2.0 09/08/2020 0344     Plan: Potassium: K 3.8-3.9:  Give KCl 40 mEq po x1   Magnesium: Mg 1.8-2: Give Mg 2 gm IV x1     Thank you for allowing pharmacy to participate in this patient's care   Jens Som, PharmD 09/08/2020 8:16 AM  Please check [email or web address] for unit-specific pharmacy phone numbers.

## 2020-09-08 NOTE — Progress Notes (Signed)
PROGRESS NOTE   Subjective/Complaints: Restless this morning. Holding onto sidereail when I came in room  ROS: Limited due to cognitive/behavioral   Objective:   No results found. Recent Labs    09/08/20 0344  WBC 11.8*  HGB 11.6*  HCT 35.8*  PLT 197      Recent Labs    09/08/20 0344  NA 138  K 3.9  CL 106  CO2 27  GLUCOSE 156*  BUN 28*  CREATININE 0.92  CALCIUM 8.9     Intake/Output Summary (Last 24 hours) at 09/08/2020 0911 Last data filed at 09/08/2020 0500 Gross per 24 hour  Intake --  Output 275 ml  Net -275 ml         Physical Exam: Vital Signs Blood pressure 128/84, pulse 76, temperature (!) 97.5 F (36.4 C), temperature source Oral, resp. rate 16, height 5\' 7"  (1.702 m), weight 86 kg, SpO2 93 %.  Constitutional:  restless Vital signs reviewed.  mittens HEENT: EOMI, oral membranes moist, NGT Neck: supple Cardiovascular: RRR without murmur. No JVD    Respiratory/Chest: CTA Bilaterally without wheezes or rales. Normal effort    GI/Abdomen: BS +, non-tender, non-distended Ext: no clubbing, cyanosis, or edema Psych: restless, follows commands with tactile cueing Skin: No evidence of breakdown, no evidence of rash Musculoskeletal:    Cervical back: no axial or limb pain     Skin:    Comments: bruising Neurological: awake, alert, right gaze preferenceDistracted. Moving all 4's Pt does extend legs antigravity to command Amb with forward flexed shuffling gait     Assessment/Plan: 1. Functional deficits which require 3+ hours per day of interdisciplinary therapy in a comprehensive inpatient rehab setting. Physiatrist is providing close team supervision and 24 hour management of active medical problems listed below. Physiatrist and rehab team continue to assess barriers to discharge/monitor patient progress toward functional and medical goals  Care Tool:  Bathing    Body parts bathed by  patient: Chest, Abdomen, Front perineal area, Face, Right arm, Left arm   Body parts bathed by helper: Buttocks, Left upper leg, Right upper leg, Right lower leg, Left lower leg     Bathing assist Assist Level: Moderate Assistance - Patient 50 - 74%     Upper Body Dressing/Undressing Upper body dressing   What is the patient wearing?: Pull over shirt    Upper body assist Assist Level: Maximal Assistance - Patient 25 - 49%    Lower Body Dressing/Undressing Lower body dressing      What is the patient wearing?: Pants     Lower body assist Assist for lower body dressing: Total Assistance - Patient < 25%     Toileting Toileting    Toileting assist Assist for toileting: Total Assistance - Patient < 25%     Transfers Chair/bed transfer  Transfers assist     Chair/bed transfer assist level: 2 Helpers     Locomotion Ambulation   Ambulation assist   Ambulation activity did not occur: Safety/medical concerns  Assist level: 2 helpers Assistive device: Walker-rolling Max distance: 10   Walk 10 feet activity   Assist  Walk 10 feet activity did not occur: Safety/medical concerns  Assist  level: 2 helpers Assistive device: Walker-rolling   Walk 50 feet activity   Assist Walk 50 feet with 2 turns activity did not occur: Safety/medical concerns  Assist level: Maximal Assistance - Patient 25 - 49% Assistive device: Walker-rolling    Walk 150 feet activity   Assist Walk 150 feet activity did not occur: Safety/medical concerns         Walk 10 feet on uneven surface  activity   Assist Walk 10 feet on uneven surfaces activity did not occur: Safety/medical Armed forces technical officer activity did not occur: Safety/medical concerns         Wheelchair 50 feet with 2 turns activity    Assist    Wheelchair 50 feet with 2 turns activity did not occur: Safety/medical concerns       Wheelchair 150 feet  activity     Assist  Wheelchair 150 feet activity did not occur: Safety/medical concerns       Blood pressure 128/84, pulse 76, temperature (!) 97.5 F (36.4 C), temperature source Oral, resp. rate 16, height 5\' 7"  (1.702 m), weight 86 kg, SpO2 93 %.  Medical Problem List and Plan: 1.  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  Impaired mobility -DVT/anticoagulation:  Eliquis restarted 6/21-pharmacy indicates increased cost to patient. More economical option is xarelto. Asked pharmacy to review with Mrs. Mccrumb. She will want to speak with cardiology as well.              -antiplatelet therapy: Continue ASA 325 mg/day 3. Pain Management: N/A 4. Mood: LCSW to follow for evaluation and support.              -antipsychotic agents: N/A 5. Neuropsych: This patient is not capable of making decisions on his own behalf. 6. Skin/Wound Care: Routine pressure relief measures.  7. Fluids/Electrolytes/Nutrition: NPO with tube feeds.             -continue reduced water flushes to 100 cc every 4 hours -Na 138 7/5, BUN/Cr climbing--increase flushes to 150cc 8. HTN: Monitor BP tid with SBP goal <140/90.              --continue losartan/day, cardura/day and Metoprolol every 6 hours Vitals:   09/07/20 2000 09/08/20 0440  BP: 110/82 128/84  Pulse: 94 76  Resp: 18 16  Temp: 97.6 F (36.4 C) (!) 97.5 F (36.4 C)  SpO2: 100% 93%  7/45controlled  9. T2DM: Hgb A1C- 6.2. well controlled 7/2 CBG (last 3)  Recent Labs    09/07/20 2323 09/08/20 0435 09/08/20 0805  GLUCAP 131* 152* 120*  Controlled 7/4  10. Lethargy: improving  -continue 200mg  amantadine -rx UTI   11. PAF: Monitor HR tid--eliquis restarted --SDH post fall 07/27/20             --continue Tikosyn bid              --appreciate pharmacy help to keep K+>4.0/Mg levels   7/3 EKG's 7/1 demonstrated junctional rhythm 110's - 120's. Rate has been controlled otherwise. Around 80bpm on my evals this weekend -ordered EKG  today which demonstrates NSR 88bpm  -continue to observe 12. Thoracic aortic aneurysm: Followed by [facility]. 14. Hypokalemia: increased repletion to 57meq BID   -3.9 7/5   15. UTI: completed kelfex course 6/26.  -6/30 ucx with 100k pseudomonas---quinolone sensitive. Continue levaquin for 7 day course  7/3 mental status has improved with rx 16. Abdominal distention: resolved  -tolerating TF currently  -dc'ed miralax,     Spoke with wife reviewed chart   17.  Insomnia:   -pt was a night owl PTA, typically staying up to 0200 and waking at 1100 each day  -has improved somewhat with trazodone 75mg  qhs--continue 18. Seizure activity: 90 second episode that self resolved on 6/24. Neuro consulted, appreciate recs, IV Ativan PRN, Keppra, seizure precautions  -none since keppra initiated  19. Dysphagia: pt remains NPO except for trials of D1,nectar with SLP   -mbs 6/29 with moderate oral dysphagia, some pharyngeal component as well  -advance per SLP today? LOS: 24 days A FACE TO [place] 09/08/2020, 9:11 AM

## 2020-09-08 NOTE — Progress Notes (Signed)
Occupational Therapy Session Note  Patient Details  Name: [name] [name] MRN: [id number] Date of Birth: [birth year]  Today's Date: 09/08/2020 OT Individual Time: 4327-6147 OT Individual Time Calculation (min): 56 min    Short Term Goals: Week 3:  OT Short Term Goal 1 (Week 3): Pt will complete stand pivot transfer with mod A OT Short Term Goal 2 (Week 3): Pt will complete UB bathing with min A OT Short Term Goal 3 (Week 3): Pt will sequence donning shirt with no more than mod cueing and mod A  Skilled Therapeutic Interventions/Progress Updates:    Pt received in room with wife and RN and consented to OT tx. Session focused on command following, LB ADL retraining, functional transfers and mobility. Pt demo's retrograde lean when standing with RW req min - mod A to stabilize when standing EOB. Pt req max cuing throughout session to attend to tasks. Pt req max A for LB dressing but was able to initiate hiking once feet were threaded. Pt assisted with oral mouth rinse and suction as pt wanted to brush his teeth. Pt req mod-max A for oral care. Pt the instructed in functional reaching task reaching for washcloths and crossing midline to hand to therapy tech. Pt req max cues, and hand over hand assist to follow instructions with fair carryover. Pt then wanted to walk, req min A to walk with RW in hallway with w/c follow and required therapist to guide walker. Pt able to walk down to gym and back with no seated rest breaks. Pt then instructed to sit EOB and got back into bed with min A, therapist completed shoulder PROM for pain mgmt for shoulder flexion for 1x10, L lateral neck flexion for 1x10. After tx, pt left up in bed with HOB 30*, wrist restraints and mitts reapplied, bed alarm on , wife present and all needs met.   Therapy Documentation Precautions:  Precautions Precautions: Fall Precaution Comments: cortrak, confusion, decreased ability to follow instructional commands Restrictions Weight  Bearing Restrictions: No   Vital Signs: Therapy Vitals Temp: (!) 97.5 F (36.4 C) Temp Source: Oral Pulse Rate: 76 Resp: 16 BP: 128/84 Patient Position (if appropriate): Lying Oxygen Therapy SpO2: 93 % O2 Device: Room Air Pain:   0/10    Therapy/Group: Individual Therapy  [name] 09/08/2020, 7:52 AM

## 2020-09-08 NOTE — Progress Notes (Signed)
Nutrition Follow-up  DOCUMENTATION CODES:   Not applicable  INTERVENTION:  Continue Jevity 1.5 cal formula via Cortrak NGT at goal rate of 65 ml/hr x 20 hours (may hold TF for up to 4 hours for therapy).   Continue 45 ml Prosource TF TID per tube.   Free water flushes of 150 ml q 4 hours per tube (MD to adjust as appropriate).   Tube feeding regimen provides 2070 kcal, 116 grams of protein, 1888 ml free water.   NUTRITION DIAGNOSIS:   Inadequate oral intake related to inability to eat as evidenced by NPO status; ongoing  GOAL:   Patient will meet greater than or equal to 90% of their needs; met with TF  MONITOR:   TF tolerance, Diet advancement, Labs, Weight trends, I & O's  REASON FOR ASSESSMENT:   New TF    ASSESSMENT:   80 yo male with a PMH of diastolic CHF, AVR/MVR, A-fib on coumadin/Tiksoyn who was admitted on 5/23 for difficulty speaking and R sided weakness after a fall the day before. 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 He was intubated for airway protection and family elected on DNR with comfort care due to poor prognosis.  However, patient started showing ability to follow commands on 5/24 with repeat CT showing persistent bilateral SDH.  He was taken to the OR for right craniotomy for evacuation of SDH. He tolerated extubation without difficulty but was limited by left greater than right-sided weakness with visual deficits, strong posterior bias as well as poor safety awareness with cognitive deficits.  He was admitted to rehab on 5/27 for intensive rehab program to consist of PT OT and speech therapy.  Pt continues on NPO status. Pt tolerating his tube feeds well. SLP continues to work with pt for diet advancement and PO readiness. Labs and medications reviewed.   Diet Order:   Diet Order             Diet NPO time specified  Diet effective now                   EDUCATION NEEDS:   Education needs have been  addressed  Skin:  Skin Assessment: Reviewed RN Assessment  Last BM:  7/4  Height:   Ht Readings from Last 1 Encounters:  08/17/20 $RemoveB'5\' 7"'FxAFuLrA$  (1.702 m)    Weight:   Wt Readings from Last 1 Encounters:  08/20/20 86 kg   BMI:  Body mass index is 29.69 kg/m.  Estimated Nutritional Needs:   Kcal:  2000-2200  Protein:  115-130 grams  Fluid:  >2 L  Corrin Parker, MS, RD, LDN RD pager number/after hours weekend pager number on Am[employer].

## 2020-09-08 NOTE — Progress Notes (Addendum)
Physical Therapy Weekly Progress Note  Patient Details  Name: BEACHER EVERY MRN: [id number] Date of Birth: 06/01/1940  Beginning of progress report period: August 30, 2020 End of progress report period: September 08, 2020  Today's Date: 09/08/2020 PT Individual Time: 0263-7858 PT Individual Time Calculation (min): 75 min   Patient has met 0 of 3 short term goals.  Patient making slow but steady progress this week, continues to be limited by deficits in attention, arousal, and motor planning. Patient in performing all mobility at a mod-min A level >75% of the time when given adequate time to self-initiate, however, he continues to require max A or +2 assist to initiate mobility with external or internal distraction or fatigue limiting motor planning and attention to tasks. Patient is ambulating 100-300 ft with +2 assist and demonstrating improved attention and command following in the mornings versus the afternoons.   Patient continues to demonstrate the following deficits muscle weakness and muscle joint tightness, decreased cardiorespiratoy endurance, impaired timing and sequencing, decreased coordination, and decreased motor planning, decreased midline orientation and decreased attention to left, decreased initiation, decreased attention, decreased awareness, decreased problem solving, decreased safety awareness, decreased memory, and delayed processing, and decreased sitting balance, decreased standing balance, decreased postural control, hemiplegia, decreased balance strategies, and difficulty maintaining precautions and therefore will continue to benefit from skilled PT intervention to increase functional independence with mobility.  Patient not progressing toward long term goals.  See goal revision.  Plan of care revisions: downgraded to mod A with focus on consistency at this level.  PT Short Term Goals Week 3:  PT Short Term Goal 1 (Week 3): Patient will perform bed mobility with mod-min A of 1  person consistently. PT Short Term Goal 1 - Progress (Week 3): Progressing toward goal PT Short Term Goal 2 (Week 3): Patient will perform basic transfers with mod-min A of 1 person with min verbal cues. PT Short Term Goal 2 - Progress (Week 3): Progressing toward goal PT Short Term Goal 3 (Week 3): Patient will ambulate >100 feet with mod A and +2 w/c follow for safety consistently using LRAD. PT Short Term Goal 3 - Progress (Week 3): Progressing toward goal PT Short Term Goal 4 (Week 3): Patient will initiate stair training for strengthening and endurance with a functional task. PT Short Term Goal 4 - Progress (Week 3): Progressing toward goal Week 4:  PT Short Term Goal 1 (Week 4): Patient will perform bed mobility with mod-min A of 1 person consistently. PT Short Term Goal 2 (Week 4): Patient will ambulate >100 feet with mod A >75% of the time and +2 w/c follow for safety  using LRAD. PT Short Term Goal 3 (Week 4): Patient will demonstrate sustained attention to a task in standing >4 min with mod cues for attention and min A for standing balance. PT Short Term Goal 4 (Week 4): Patient will perform basic transfers with mod-min A of 1 person with min verbal cues.  Skilled Therapeutic Interventions/Progress Updates:     Patient in bed with nursing staff completing lower body clothing management following peri-care for bladder incontinence upon PT arrival. Patient alert throughout session. His wife was present throughout session, reports increased restlessness, pulling at soft wrist restraints, x2 hours and x3 incidence of incontinence prior to PT session.   Bed Mobility: Patient performed supine to sit with min A for facilitation to initiate mobility and significantly increased time due to motor planning deficits. Patient performed sit to supine with mod-max  A due to poor ability to follow PT's cues and/or motor planning deficits. Provided mod-max multimodal cues for initiation of mobility and  sequencing.  Transfers: Patient performed sit to/from stand x4 with min A to stand with RW x2, mod A to stand without RW x2, and max A to initiate sitting due to poor attention and motor planing. Provided max multimodal cues for initiation and hand placement on RW.  Gait Training/Therapeutic Activity:  Patient ambulated 10-20 feet x4 with standing or sitting rest breaks in between using RW x3 and shifting between 1 person HHA and B HHA from 1 person with a second person for w/c follow. Required mod-max A to initiate gait each trial and min A to progress gait with RW facilitating forwar propulsion and mod A for balance with HHA. Ambulated with shuffling gait pattern with his feet positioned to the L throughout, forward trunk flexed and significant internal and external distraction. Provided max multimodal cues for initiation, attention to task, forward progression, and awareness. Patient easily distracted by objects at the sink, on a wall, or where his wife was, usually behind with w/c. Patient able to sustain attention <1 min during gait trials requiring max A to redirect to gait training x2 and unable to redirect leading to patient self-initiating washing his hands at the sink x1 and reading a sign on the wall x1, but patient unable to complete task without max cues due to inattention. While at the sink patient very distracted by objects and grabbed several objects and brought them to his mouth. PT intercepted objects before they were placed in his mouth for patient safety.    Patient in bed with foam dressings applied to wrists to maintain skin integrity and B soft wrist restraints and B mitts secured and his wife at bedside at end of session with breaks locked, bed alarm set, and all needs within reach.   Therapy Documentation Precautions:  Precautions Precautions: Fall Precaution Comments: cortrak, confusion, decreased ability to follow instructional commands Restrictions Weight Bearing  Restrictions: No   Therapy/Group: Individual Therapy  Athina Fahey L Glory Graefe PT, DPT  09/08/2020, 6:45 PM

## 2020-09-08 NOTE — Progress Notes (Signed)
Patient ID: [name] [name], male   DOB: 09/13/1940, 79 y.o.   MRN: [id number] Team Conference Report to Patient/Family  Team Conference discussion was reviewed with the patient and caregiver, including goals, any changes in plan of care and target discharge date.  Patient and caregiver express understanding and are in agreement.  The patient has a target discharge date of 09/14/20.  Covering for primary SW, Auria  Sw met with pt and family, provided conference updates. Family has made determination of SNF. Would like to speak with MD in regards to an extension.    J  09/08/2020, 12:51 PM  

## 2020-09-08 NOTE — Progress Notes (Signed)
Speech Language Pathology Weekly Progress and Session Note  Patient Details  Name: [name] [name] MRN: [id number] Date of Birth: March 12, 1940  Beginning of progress report period: September 01, 2020 End of progress report period: September 08, 2020  Today's Date: 09/08/2020 SLP Individual Time: 1914-7829 SLP Individual Time Calculation (min): 30 min  Short Term Goals: Week 3: SLP Short Term Goal 1 (Week 3): Pt will consume therapeutic trials of Dys. 1 textures wiht nectar-thick liquids via tsp with minimal overt s/s of aspiration and min assist for use of swallowing precautions over 2 sessions prior to diet initiation. SLP Short Term Goal 1 - Progress (Week 3): Not met SLP Short Term Goal 2 (Week 3): Pt will maintain alertness for periods of 2 minutes to participate in basic tasks with max assist multimodal cues consistently over 3 consecutive sessions. SLP Short Term Goal 2 - Progress (Week 3): Met SLP Short Term Goal 3 (Week 3): Pt will use multimodal means of communication (gestures, head nod/shake, pointing, verbalizing, vocalizing) to convey his immediate needs and wants to caregivers with mod assist multimodal cues. SLP Short Term Goal 3 - Progress (Week 3): Met SLP Short Term Goal 4 (Week 3): Patient will utilize an increased vocal intensity at the phrase level to achieve ~75% intelligibility with Max verbal cues. SLP Short Term Goal 4 - Progress (Week 3): Not met SLP Short Term Goal 5 (Week 3): Patient will initiate functional tasks with Max A multimodal cues. SLP Short Term Goal 5 - Progress (Week 3): Met    New Short Term Goals: Week 4: SLP Short Term Goal 1 (Week 4): STGs=LTGs due to ELOS  Weekly Progress Updates: Patient continues to make functional but inconsistent gains and has met 2 of 5 STGs this reporting period. Currently, patient is consuming "snacks" of Dys. 1 textures with honey-thick liquids via cup with minimal overt s/s of aspiration. However, due to patient's fluctuating arousal  and attention, overall Max A multimodal cues are needed for initiation and attention to bolus. Because of this, suspect patient may need long term alternative means of nutrition to help meet nutritional needs despite suspected initiation of a dysphagia diet. Patient continues to utilize gestures as his primary mode of communication and requires Max A multimodal cues for verbal initiation and use of an increased vocal intensity to maximize intelligibility. Max-Total A multimodal cues are also needed for sustained attention and orientation. Patient and family education ongoing. Patient would benefit from continued skilled SLP intervention to maximize his speech, cognitive and swallowing function prior to discharge.      Intensity: Minumum of 1-2 x/day, 30 to 90 minutes Frequency: 3 to 5 out of 7 days Duration/Length of Stay: 09/14/20 Treatment/Interventions: Cognitive remediation/compensation;Cueing hierarchy;Dysphagia/aspiration precaution training;Internal/external aids;Speech/Language facilitation;Environmental controls;Multimodal communication approach;Patient/family education;Therapeutic Activities   Daily Session  Skilled Therapeutic Interventions:  Skilled treatment session focused on dysphagia and speech goals. Upon arrival, patient was awake and alert. SLP facilitated session by providing a snack of Dys. 1 textures with honey-thick liquids via cup. Patient self-fed 100% of his meal with overall Mod A verbal and visual cues. Patient consumed meal without overt s/s of aspiration and demonstrated what appeared to be a more timely swallow initiation. Recommend ongoing trials X 1 prior to diet initiation. Patient responded in 100% of opportunities with all responses being appropriate. Patient also utilized an increased vocal intensity in 50% of opportunities. Wife present and educated on progress. Continue with current plan of care.      Pain Pain  in left shoulder-RN aware  Therapy/Group:  Individual Therapy  Braelen Sproule 09/08/2020, 6:34 AM

## 2020-09-08 NOTE — Progress Notes (Signed)
Speech Language Pathology Daily Session Note  Patient Details  Name: [name] [name] MRN: [id number] Date of Birth: 1941/03/02  Today's Date: 09/08/2020 SLP Individual Time: 1130-1200 SLP Individual Time Calculation (min): 30 min  Short Term Goals: Week 4: SLP Short Term Goal 1 (Week 4): STGs=LTGs due to ELOS  Skilled Therapeutic Interventions: Skilled treatment session focused on cognitive goals. Upon arrival, patient was asleep in bed. SLP facilitated session by providing verbal, tactile and environmental modifications to maximize arousal. Patient was able to keep his eyes open for ~15 minutes but was unable to initiate functional tasks or verbal responses despite Max A multimodal cues. Patient appeared to stare into his right field of environment and unable to scan to midline with a stimuli. Because of this, patient unable to safely take any trials at this time. Recommend continued trials with SLP only until consistent arousal. Wife present and educated on recommendations. She verbalized understanding and agreement. Patient left upright in bed with alarm on and all needs within reach. Continue with current plan of care.       Pain No/Denies Pain   Therapy/Group: Individual Therapy  [name] 09/08/2020, 12:04 PM

## 2020-09-08 NOTE — Patient Care Conference (Signed)
Inpatient RehabilitationTeam Conference and Plan of Care Update Date: 09/08/2020   Time: 10:02 AM    Patient Name: [name] [name]      Medical Record Number: [id number]  Date of Birth: [birth year] Sex: Male         Room/Bed: 4M01C/4M01C-01 Payor Info: Payor: MEDICARE / Plan: MEDICARE PART A AND B / Product Type: *No Product type* /    Admit Date/Time:  08/15/2020  2:18 PM  Primary Diagnosis:  Acute ischemic right MCA stroke [facility])  Hospital Problems: Principal Problem:   Acute ischemic right MCA stroke [facility]) Active Problems:   Atrial fibrillation, chronic ([place])   Oropharyngeal dysphagia   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 Alger Simons Care Coodinator Present: Erlene Quan, BSW;Yecenia Dalgleish Creig Hines, RN, BSN, [place] Nurse Present: [name], RN PT Present: Apolinar Junes, PT OT Present: Other (comment) Paulette Blanch, OT) SLP Present: Weston Anna, SLP PPS Coordinator present : Gunnar Fusi, SLP     Current Status/Progress Goal Weekly Team Focus  Bowel/Bladder   incontinent B/B, Last BM on 09/08/20  will be able to control B/B  continue assess   Swallow/Nutrition/ Hydration   NPO with PO trials with SLP only  Mod A  snacks of Dys. 1 textures with honey-thick liquids   ADL's   Internally/externally distracted- can be as little as mod A with UB/LB ADLs and frequently more like max A. transfers can be min A when attending  min A overall  ADls, focused attention, cognitive retraining, initiation, family edu, d/c planning   Mobility   Mod-min A overall, continues to reauire max A and intermittently +2 depending on attention and initiation  Min A overall  Attention, initiation, cognitive retraining, motor planning, functional mobility, activity tolerance, balance, gait training, cervical ROM,  patient/caregiver education   Communication   Mod-Max A  Mod A  initiatin of verbal expression, communication of wants/needs, increased vocal intensity   Safety/Cognition/ Behavioral Observations  Max A  Mod A  arousal, sustained attention, initiation   Pain   ossasitional mild neck pain, managing with PRN Tylenol.  Patient will be pain free.  Assess pain  Q shift and PRN   Skin   Surgical insision dry intact and healing,bruising to L hand, no open area.  Skin remain intact  Assess skin Q shift.     Discharge Planning:  SNF vs Home with [place]. Pt to have support from wife. They have the option to utilize SNF at [facility]. D/c location TBD.   Team Discussion: Treating UTI, restless at night, can't be on scheduled Seroquel  due to cardiac issues. Continent/incontinent B/B, no reports of pain.  Patient on target to meet rehab goals: Patient is still distracted, can be mod assist with ADL's, transfers can be min assist when not distracted. +2 for safety, overall making some progress. Wife still has lots of questions. SLP reports talking more, more initiation. Ate a whole thing of pudding. Wife pushing for extension of stay.   *See Care Plan and progress notes for long and short-term goals.   Revisions to Treatment Plan:  Adjusting medications and goals  Teaching Needs: Family education, medication management, sleep management, transfer training, gait training, balance training, endurance training, behavior management.  Current Barriers to Discharge: Decreased caregiver support, Medical stability, Home enviroment access/layout, Incontinence, Lack of/limited family support, Weight, Medication compliance, Behavior,  and Nutritional means  Possible Resolutions to Barriers: Continue current medications, provide emotional support.     Medical Summary Current Status: more alert. sleep is inconsistent. tolerating trazodone. rxing UTI. ngt---begin diet per SLP  Barriers to Discharge: Medical  stability;Behavior   Possible Resolutions to [employer]: med mgt, daily assessment of labs, pt data, rx uti   Continued Need for Acute Rehabilitation Level of Care: The patient requires daily medical management by a physician with specialized training in physical medicine and rehabilitation for the following reasons: Direction of a multidisciplinary physical rehabilitation program to maximize functional independence : Yes Medical management of patient stability for increased activity during participation in an intensive rehabilitation regime.: Yes Analysis of laboratory values and/or radiology reports with any subsequent need for medication adjustment and/or medical intervention. : Yes   I attest that I was present, lead the team conference, and concur with the assessment and plan of the team.   Cristi Loron 09/08/2020, 2:16 PM

## 2020-09-09 ENCOUNTER — Telehealth (HOSPITAL_COMMUNITY): Payer: Self-pay | Admitting: Surgery

## 2020-09-09 LAB — GLUCOSE, CAPILLARY
Glucose-Capillary: 107 mg/dL — ABNORMAL HIGH (ref 70–99)
Glucose-Capillary: 129 mg/dL — ABNORMAL HIGH (ref 70–99)
Glucose-Capillary: 137 mg/dL — ABNORMAL HIGH (ref 70–99)
Glucose-Capillary: 147 mg/dL — ABNORMAL HIGH (ref 70–99)
Glucose-Capillary: 161 mg/dL — ABNORMAL HIGH (ref 70–99)
Glucose-Capillary: 166 mg/dL — ABNORMAL HIGH (ref 70–99)

## 2020-09-09 NOTE — Telephone Encounter (Signed)
I called Ms. Dieppa regarding the patient's Zio monitor that was applied in clinic prior to patient's emergent admission on 5/23.  She does say that it was applied and taken off in approx 2 hour while enroute to the hospital.  I will send [place] representative a message to credit patient for the test since it was D/C'd early.

## 2020-09-09 NOTE — Progress Notes (Signed)
Speech Language Pathology Daily Session Note  Patient Details  Name: [name] [name] MRN: [id number] Date of Birth: Aug 08, 1940  Today's Date: 09/09/2020  Session 1: SLP Individual Time: 0830-0900 SLP Individual Time Calculation (min): 30 min  Session 2: SLP Individual Time: 5110-2111 SLP Individual Time Calculation (min): 35 min  Short Term Goals: Week 4: SLP Short Term Goal 1 (Week 4): STGs=LTGs due to ELOS  Skilled Therapeutic Interventions:   Session 1: Skilled treatment session focused on dysphagia and speech goals. Upon arrival, patient was awake and alert. SLP facilitated session by providing a snack of Dys. 1 textures with honey-thick liquids via cup. Patient self-fed 100% of his meal with overall Mod A verbal and visual cues. Patient consumed meal with overt cough X 1, suspect due to bolus size. Recommend patient initiate a diet of Dys. 1 textures with honey-thick liquids with full supervision. Patient responded in 100% of opportunities with intermittent language of confusion. Patient was ~75% intelligible throughout session. Wife present and educated on recommendations, she verbalized understanding and agreement. Continue with current plan of care.         Session 2:     Skilled treatment session focused on dysphagia and cognitive goals. Upon arrival, patient was awake in the wheelchair. SLP facilitated session by providing overall Max A multimodal cues for initiation with self-feeding with lunch meal of Dys. 1 textures with honey-thick liquids. Patient consumed meal with overt cough X 1, suspect due to impulsivity with bolus size and rate. Recommend patient continue current diet. Patient was restless and distractible while upright in the wheelchair and required constant multimodal cues for attention to task. Patient with minimal verbal responses and only responded in 25% of opportunities with decreased intelligibility noted.    Patient left upright in the wheelchair with alarm on and wife  present. Continue with current plan of care.          Pain No/Denies Pain   Therapy/Group: Individual Therapy  [name] 09/09/2020, 3:24 PM

## 2020-09-09 NOTE — Plan of Care (Signed)
Plan of care adjusted to reflect SNF d/c and lack of progress toward goals.   Problem: RH Balance Goal: LTG: Patient will maintain dynamic sitting balance (OT) Description: LTG:  Patient will maintain dynamic sitting balance with assistance during activities of daily living (OT) Flowsheets (Taken 09/09/2020 1519) LTG: Pt will maintain dynamic sitting balance during ADLs with: (downgraded 7/6- SD) Minimal Assistance - Patient > 75% Note: downgraded 7/6- SD   Problem: RH Eating Goal: LTG Patient will perform eating w/assist, cues/equip (OT) Description: LTG: Patient will perform eating with assist, with/without cues using equipment (OT) Flowsheets (Taken 09/09/2020 1519) LTG: Pt will perform eating with assistance level of: (downgraded 7/6- SD) Minimal Assistance - Patient > 75% Note: downgraded 7/6- SD   Problem: RH Grooming Goal: LTG Patient will perform grooming w/assist,cues/equip (OT) Description: LTG: Patient will perform grooming with assist, with/without cues using equipment (OT) Flowsheets (Taken 09/09/2020 1519) LTG: Pt will perform grooming with assistance level of: (downgraded 7/6- SD) Minimal Assistance - Patient > 75% Note: downgraded 7/6- SD   Problem: RH Bathing Goal: LTG Patient will bathe all body parts with assist levels (OT) Description: LTG: Patient will bathe all body parts with assist levels (OT) Flowsheets (Taken 09/09/2020 1519) LTG: Pt will perform bathing with assistance level/cueing: (downgraded 7/6- SD) Moderate Assistance - Patient 50 - 74% Note: downgraded 7/6- SD   Problem: RH Dressing Goal: LTG Patient will perform upper body dressing (OT) Description: LTG Patient will perform upper body dressing with assist, with/without cues (OT). Flowsheets (Taken 09/09/2020 1519) LTG: Pt will perform upper body dressing with assistance level of: (downgraded 7/6- SD) Maximal Assistance - Patient 25 - 49% Goal: LTG Patient will perform lower body dressing w/assist  (OT) Description: LTG: Patient will perform lower body dressing with assist, with/without cues in positioning using equipment (OT) Flowsheets (Taken 09/09/2020 1519) LTG: Pt will perform lower body dressing with assistance level of: (downgraded 7/6- SD) Maximal Assistance - Patient 25 - 49% Note: downgraded 7/6- SD   Problem: RH Toileting Goal: LTG Patient will perform toileting task (3/3 steps) with assistance level (OT) Description: LTG: Patient will perform toileting task (3/3 steps) with assistance level (OT)  Flowsheets (Taken 09/09/2020 1519) LTG: Pt will perform toileting task (3/3 steps) with assistance level: (downgraded 7/6- SD) Maximal Assistance - Patient 25 - 49% Note: downgraded 7/6- SD   Problem: RH Toilet Transfers Goal: LTG Patient will perform toilet transfers w/assist (OT) Description: LTG: Patient will perform toilet transfers with assist, with/without cues using equipment (OT) Flowsheets (Taken 09/09/2020 1519) LTG: Pt will perform toilet transfers with assistance level of: (downgraded 7/6- SD) Moderate Assistance - Patient 50 - 74% Note: downgraded 7/6- SD   Problem: RH Tub/Shower Transfers Goal: LTG Patient will perform tub/shower transfers w/assist (OT) Description: LTG: Patient will perform tub/shower transfers with assist, with/without cues using equipment (OT) Outcome: Not Applicable   Problem: RH Attention Goal: LTG Patient will demonstrate this level of attention during functional activites (OT) Description: LTG:  Patient will demonstrate this level of attention during functional activites  (OT) Flowsheets (Taken 09/09/2020 1519) LTG: Patient will demonstrate this level of attention during functional activites (OT): (downgraded 7/6- SD) Moderate Assistance - Patient 50 - 74%   Problem: RH Awareness Goal: LTG: Patient will demonstrate awareness during functional activites type of (OT) Description: LTG: Patient will demonstrate awareness during functional activites  type of (OT) Outcome: Not Applicable

## 2020-09-09 NOTE — Plan of Care (Signed)
  Problem: RH Bed to Chair Transfers Goal: LTG Patient will perform bed/chair transfers w/assist (PT) Description: LTG: Patient will perform bed to chair transfers with assistance (PT). Flowsheets (Taken 09/09/2020 1751) LTG: Pt will perform Bed to Chair Transfers with assistance level: (downgraded goal due to inconsistency of patient performace secondary to cognitive deficits) Moderate Assistance - Patient 50 - 74% Note: downgraded goal due to inconsistency of patient performace secondary to cognitive deficits

## 2020-09-09 NOTE — Progress Notes (Signed)
PROGRESS NOTE   Subjective/Complaints: Didn't sleep a whole lot last night. 3-4 hours by chart. Awake in bed this morning. Restless  ROS: Limited due to cognitive/behavioral   Objective:   No results found. Recent Labs    09/08/20 0344  WBC 11.8*  HGB 11.6*  HCT 35.8*  PLT 197      Recent Labs    09/08/20 0344  NA 138  K 3.9  CL 106  CO2 27  GLUCOSE 156*  BUN 28*  CREATININE 0.92  CALCIUM 8.9     Intake/Output Summary (Last 24 hours) at 09/09/2020 0758 Last data filed at 09/08/2020 1800 Gross per 24 hour  Intake 325 ml  Output --  Net 325 ml         Physical Exam: Vital Signs Blood pressure 104/85, pulse 88, temperature 97.9 F (36.6 C), temperature source Oral, resp. rate 18, height 5\' 7"  (1.702 m), weight 86 kg, SpO2 96 %.  Constitutional: No distress . Vital signs reviewed. HEENT: EOMI, oral membranes moist Neck: supple Cardiovascular: RRR without murmur. No JVD    Respiratory/Chest: CTA Bilaterally without wheezes or rales. Normal effort    GI/Abdomen: BS +, non-tender, non-distended Ext: no clubbing, cyanosis, or edema Psych: restless and distracted Skin: No evidence of breakdown, no evidence of rash Musculoskeletal:    Cervical back: no axial or limb pain     Skin:    Comments: bruising Neurological: awake, alert, right gaze preference Distracted. Moves all 4's right more than left.       Assessment/Plan: 1. Functional deficits which require 3+ hours per day of interdisciplinary therapy in a comprehensive inpatient rehab setting. Physiatrist is providing close team supervision and 24 hour management of active medical problems listed below. Physiatrist and rehab team continue to assess barriers to discharge/monitor patient progress toward functional and medical goals  Care Tool:  Bathing    Body parts bathed by patient: Chest, Abdomen, Front perineal area, Face, Right arm, Left arm    Body parts bathed by helper: Buttocks, Left upper leg, Right upper leg, Right lower leg, Left lower leg     Bathing assist Assist Level: Moderate Assistance - Patient 50 - 74%     Upper Body Dressing/Undressing Upper body dressing   What is the patient wearing?: Pull over shirt    Upper body assist Assist Level: Maximal Assistance - Patient 25 - 49%    Lower Body Dressing/Undressing Lower body dressing      What is the patient wearing?: Pants     Lower body assist Assist for lower body dressing: Total Assistance - Patient < 25%     Toileting Toileting    Toileting assist Assist for toileting: Total Assistance - Patient < 25%     Transfers Chair/bed transfer  Transfers assist     Chair/bed transfer assist level: 2 Helpers     Locomotion Ambulation   Ambulation assist   Ambulation activity did not occur: Safety/medical concerns  Assist level: 2 helpers Assistive device: Walker-rolling Max distance: 10   Walk 10 feet activity   Assist  Walk 10 feet activity did not occur: Safety/medical concerns  Assist level: 2 helpers Assistive device: [employer]  Walk 50 feet activity   Assist Walk 50 feet with 2 turns activity did not occur: Safety/medical concerns  Assist level: Maximal Assistance - Patient 25 - 49% Assistive device: Walker-rolling    Walk 150 feet activity   Assist Walk 150 feet activity did not occur: Safety/medical concerns         Walk 10 feet on uneven surface  activity   Assist Walk 10 feet on uneven surfaces activity did not occur: Safety/medical concerns         Wheelchair     Assist     Wheelchair activity did not occur: Safety/medical concerns         Wheelchair 50 feet with 2 turns activity    Assist    Wheelchair 50 feet with 2 turns activity did not occur: Safety/medical concerns       Wheelchair 150 feet activity     Assist  Wheelchair 150 feet activity did not occur:  Safety/medical concerns       Blood pressure 104/85, pulse 88, temperature 97.9 F (36.6 C), temperature source Oral, resp. rate 18, height 5\' 7"  (1.702 m), weight 86 kg, SpO2 96 %.  Medical Problem List and Plan: 1.  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  Impaired mobility -DVT/anticoagulation:  Eliquis restarted 6/21-pharmacy indicates increased cost to patient. More economical option is xarelto. Asked pharmacy to review with Mrs. Guthmiller. She will want to speak with cardiology as well.              -antiplatelet therapy: Continue ASA 325 mg/day 3. Pain Management: N/A 4. Mood: LCSW to follow for evaluation and support.              -antipsychotic agents: N/A 5. Neuropsych: This patient is not capable of making decisions on his own behalf. 6. Skin/Wound Care: Routine pressure relief measures.  7. Fluids/Electrolytes/Nutrition: NPO with tube feeds.             -continue reduced water flushes to 100 cc every 4 hours -Na 138 7/5, BUN/Cr climbing--increased flushes to [place] bMET Friday 8. HTN: Monitor BP tid with SBP goal <140/90.              --continue losartan/day, cardura/day and Metoprolol every 6 hours Vitals:   09/08/20 2000 09/09/20 0417  BP: 122/79 104/85  Pulse: 87 88  Resp: 18 18  Temp: 99.2 F (37.3 C) 97.9 F (36.6 C)  SpO2: 100% 96%  7/6 controlled  9. T2DM: Hgb A1C- 6.2.   CBG (last 3)  Recent Labs    09/08/20 2003 09/09/20 0001 09/09/20 0409  GLUCAP 145* 129* 161*  Controlled 7/4  10. Lethargy: improved -continue 200mg  amantadine -rx UTI   11. PAF: Monitor HR tid--eliquis restarted --SDH post fall 07/27/20             --continue Tikosyn bid             --appreciate pharmacy help to keep  K+>4.0/Mg levels   7/6 rate has been generally controlled 12. Thoracic aortic aneurysm: Followed by [facility]. 14. Hypokalemia: increased repletion to 53meq BID   -  3.9 7/5   15. UTI: completed kelfex course 6/26.  -6/30 ucx with 100k pseudomonas--- levaquin for 7 day course  7/3 mental status has improved with rx 16. Abdominal distention: resolved  -tolerating TF currently  -dc'ed miralax,     Spoke with wife reviewed chart   17.  Insomnia:   -pt was a night owl PTA, typically staying up to 0200 and waking at 1100 each day  -improved somewhat with 100mg  trazodone 18. Seizure activity: 90 second episode that self resolved on 6/24. Neuro consulted, appreciate recs, IV Ativan PRN, Keppra, seizure precautions  -none since keppra initiated  19. Dysphagia:advanced to D1/honeys by SLP  -mbs 6/29 with moderate oral dysphagia, some pharyngeal component as well  -observe for intake. Continue same NG feeds   LOS: 25 days A FACE TO [place] 09/09/2020, 7:58 AM

## 2020-09-09 NOTE — Progress Notes (Signed)
Occupational Therapy Session Note  Patient Details  Name: [name] [name] MRN: [id number] Date of Birth: [birth year]  Today's Date: 09/09/2020 OT Individual Time:  -       Short Term Goals: Week 4:  OT Short Term Goal 1 (Week 4): STG= LTG d/t ELOS  Skilled Therapeutic Interventions/Progress Updates:    Pt in bed to start session with spouse present.  He voiced the need to complete toileting, so completed supine to sit with mod assist and mod demonstrational cueing for initiation.  He then ambulated with hand held max assist to the bathroom with max assist for sit to stand as well.  He demonstrates severe posterior lean in standing with max demonstrational cueing to complete ambulation through the doorway of the bathroom and over to the toilet secondary to motor planning issues.  He needed max assist for management of clothing as well with total +2 (pt 25%) for toilet hygiene in standing and for clothing management.  He perseverates on wanting to wipe his nose or reach to his nose, requiring max demonstrational  cueing to stop perseveration.  Transferred back out to the wheelchair with for bathing and dressing tasks.  He was able to wash his face with max hand over hand for initiation using the RUE secondary to decreased understanding.  He was only able to assist approximately 25% with bathing as well, requiring max to total assist for thoroughness.  Total assist for sequencing and donning a pullover shirt, when therapist would try to assist, he would continue to remove his arm from the shirt, not being aware that therapist was assisting him with this.  He would also reach for therapist's shirt at times when told to hold onto his own or pull it up likely due to visual perceptual issues.  Total +2 (pt 25%) for completing donning shorts sit to stand and for fastening.  Finished session with pt in the tilt in space wheelchair with the call button in reach and spouse providing supervision.  Nursing made aware  that pt is sitting up and per spouse report, does not need mitts or wrist restraints as she is going to supervise him until next session.  Gait belt and seat belt in place as well for safety with nursing made aware that there are no alarm belts available.    Therapy Documentation Precautions:  Precautions Precautions: Fall Precaution Comments: cortrak, confusion, decreased ability to follow instructional commands Restrictions Weight Bearing Restrictions: No   Pain: Pain Assessment Pain Scale: Faces Pain Score: 0-No pain Faces Pain Scale: No hurt ADL: See Care Tool Section for some details of mobility and selfcare   Therapy/Group: Individual Therapy  Avika Carbine OTR/L 09/09/2020, 10:40 AM

## 2020-09-09 NOTE — Progress Notes (Signed)
Physical Therapy Session Note  Patient Details  Name: [name] [name] MRN: [id number] Date of Birth: November 01, 1940  Today's Date: 09/09/2020 PT Individual Time: 1102-1155 and 1250-1300 PT Individual Time Calculation (min): 53 min and 10 min   Short Term Goals: Week 4:  PT Short Term Goal 1 (Week 4): Patient will perform bed mobility with mod-min A of 1 person consistently. PT Short Term Goal 2 (Week 4): Patient will ambulate >100 feet with mod A >75% of the time and +2 w/c follow for safety  using LRAD. PT Short Term Goal 3 (Week 4): Patient will demonstrate sustained attention to a task in standing >4 min with mod cues for attention and min A for standing balance. PT Short Term Goal 4 (Week 4): Patient will perform basic transfers with mod-min A of 1 person with min verbal cues.  Skilled Therapeutic Interventions/Progress Updates:     Session 1: Patient in TIS w/c with his wife in the room upon PT arrival. Patient alert for >75% of session, indicated fatigue by self initiating returning to bed and had eyes closed and resting during last part of session for manual therapy, see below. Patient reported indicated neck pain/discomfort with rubbing his neck and grimacing during session, RN made aware. PT provided repositioning, rest breaks, and distraction as pain interventions throughout session.   Therapeutic Activity: Bed Mobility: Patient performed supine to sit with mod A for initiation with x3 attempts due to patient resistance with putting his feet back on the bed x2. Provided max verbal cues for initiation and sequencing. Patient returned to the bed by patient initiating transition to bed while attempting to assist patient to the bathroom and the patient spontaneously performing sit to supine with CGA for safety. Required max A +2 to scoot up in the bed x3 due to patient lying at the end of the bed and unable to redirect patient to return to sitting with total A due to active resistance from  patient.  Transfers: Patient performed sit to/from stand x2 with B HHA with min A +2. Provided verbal cues for initiation and facilitation for forward weight shift.  Patient requested water x2 during session. Patient provided with Honey thick water and took 3 sips with a spoon with total A, 2 sips from the cup with total A, and progressed to 3 sips from the cup with supervision. Noted no aspiration and cued for additional swallowing between sips for ensured clearance.   Gait Training:  Patient ambulated ~65 feet, stopped in standing due to increased environmental stimulation (multiple people entering the hallway with load talking), unable to redirect to continue ambulation after 3 cues, redirected to ambulating the opposite direction after 1 cue, patient then ambulated ~65, PT cued patient to continue ambulating past his room, however, the patient self selected to return to his room without cues for which room was his. Ambulated with B HHA and min/mod A +2 for reduced distraction from AD and his wife followed with the TIS w/c for safety. Noted increased foot clearance, gait speed, and step-length this session with only intermittent shuffling of gait, however, had increased trunk flexion on return to his room, able to correct x2 with cues for locating a visual target ahead of him.  Manual Therapy: -B DF stretch 2x1 min followed by mobilization with movement with posterior glide at the talus for improved DF, limited on L>R -B cervical rotation with manual depression at the acromion for upper trap stretch 2x90 sec, progressed to incorporate cervical flexion with  rotation on second trial -soft tissue mobilization to suboccipitals, upper trap, scalene, SCM, and splenius muscles for muscle release and pain management x 5 min  Patient in bed asleep with his wife at bedside with B soft wrist restraints and mitts secured at end of session with breaks locked, bed alarm set, and all needs within reach.   Session  2: Returned to make up missed time form earlier session and transfer patient to TIS w/c for ST session. Patient in bed asleep with his wife in the room at beginning of session. Patient aroused to verbal stimulation. Patient with smear BM in brief at beginning of session and patient agreeable to ambulating to the bathroom to attempt BM. Performed supine to sit with min A and cues for initiation. Ambulated to/from the bathroom and performed toilet transfer with min A +2 using B HHA. Patient remained seated on the [facility] over the toilet >2 min before standing spontaneously using grab bar with CGA for safety. Patient was unsuccessful at having BM during toileting. Patient unable to be redirected to return to sitting and remained standing with min A-CGA holding the grab bar while NT performed peri-care and donned a new brief. Patient required max cues to remain standing in front of the toilet during peri care and dressing and he was attempting to ambulate out of the bathroom. Donned shorts in sitting in TIS w/c with max-total A and patient performed sit to stand with mod A without AD and patient reluctant to stand completely. Able to fasten shorts in sitting and secure seat belt and gait belt around hips for patient positioning when handed off to SLP at end of session.   Therapy Documentation Precautions:  Precautions Precautions: Fall Precaution Comments: cortrak, confusion, decreased ability to follow instructional commands Restrictions Weight Bearing Restrictions: No    Therapy/Group: Individual Therapy  Kendre Jacinto L Shandiin Eisenbeis PT, DPT  09/09/2020, 5:26 PM

## 2020-09-09 NOTE — Progress Notes (Signed)
Patient ID: [name] [name], male   DOB: 04-12-1940, 80 y.o.   MRN: [id number]  Covering for primary SW, Auria  SW reached out to [place] (SW) at [facility] in regards to SNF bed availability. Left detailed VM. SW waiting for follow up  Erlene Quan, [place]

## 2020-09-09 NOTE — Progress Notes (Signed)
Occupational Therapy Weekly Progress Note  Patient Details  Name: [name] [name] MRN: [id number] Date of Birth: [birth year]  Beginning of progress report period: September 02, 2020 End of progress report period: September 09, 2020  Today's Date: 09/09/2020 OT Individual Time: 1400-1456 OT Individual Time Calculation (min): 56 min    Patient has met 0 of 3 short term goals. Pt continues to fluctuate in his progress with OT. He is often much more alert in the morning and able to attend to ADLs with mod cueing. In the afternoon, he requires more max cueing and max A overall with all ADLs. Pt is unable to sustain attention to any task for more than several seconds at a time and is consistently internally and externally distracted. He is able to physically complete transfers with less assist when he is motivated to do so and processes the command. SNF is most appropriate d/c and goals have been downgraded to reflect this.   Patient continues to demonstrate the following deficits: muscle weakness, decreased cardiorespiratoy endurance, decreased midline orientation, decreased attention to left, decreased motor planning, and ideational apraxia, decreased initiation, decreased attention, decreased awareness, decreased problem solving, decreased safety awareness, decreased memory, and delayed processing, and decreased sitting balance, decreased standing balance, decreased postural control, hemiplegia, and decreased balance strategies and therefore will continue to benefit from skilled OT intervention to enhance overall performance with BADL and Reduce care partner burden.  Patient not progressing toward long term goals.  See goal revision..  Plan of care revisions: Goals downgraded to mod-max A overall.  OT Short Term Goals Week 3:  OT Short Term Goal 1 (Week 3): Pt will complete stand pivot transfer with mod A OT Short Term Goal 1 - Progress (Week 3): Progressing toward goal OT Short Term Goal 2 (Week 3): Pt will  complete UB bathing with min A OT Short Term Goal 2 - Progress (Week 3): Not met OT Short Term Goal 3 (Week 3): Pt will sequence donning shirt with no more than mod cueing and mod A OT Short Term Goal 3 - Progress (Week 3): Not met Week 4:  OT Short Term Goal 1 (Week 4): STG= LTG d/t ELOS  Skilled Therapeutic Interventions/Progress Updates:    Pt received sitting in w/c, sleeping with his wife present. Focus of session on initiation, sequencing, motor planning, and attention to task. Session began with pt stating he needed to use the bathroom and restlessly pulling at pants. Upon initiating transfer pt started functional mobility out of room despite MAX redirection. He required max A to sit in the TIS and be taken back into the room to attempt toilet transfer. Pt now actively resisting transfer and stating "I don't have to go". Despite attempts d/t suspicion pt does have to go, pt was unable to follow commands to initiate transfer to toilet. Pt was taken to the therapy gym. He completed stand pivot transfer to the mat with mod A and max cueing for sequencing. He sat briefly with CGA before transitioning to supine despite cueing. Pt refused to come out of supine and so, attempted to have him complete BUE coordination task from supine with a 5 lb dowel. He was able to follow motor plan for 5 repetitions before being unable to maintain attention to task. Pt impulsively stood from EOM with min steadying A. Odor of BM became obvious. Pt was taken back to his room. Dependent brief change, with pt being incredibly restless and making great mess of BM. +2 assist required  to help manage pt's hands. Pt was returned to supine and left with B wrist restraints and hand mittens on. Bed alarm set and NG running.   Therapy Documentation Precautions:  Precautions Precautions: Fall Precaution Comments: cortrak, confusion, decreased ability to follow instructional commands Restrictions Weight Bearing Restrictions:  No   Therapy/Group: Individual Therapy  Curtis Sites 09/09/2020, 6:40 AM

## 2020-09-10 LAB — GLUCOSE, CAPILLARY
Glucose-Capillary: 117 mg/dL — ABNORMAL HIGH (ref 70–99)
Glucose-Capillary: 130 mg/dL — ABNORMAL HIGH (ref 70–99)
Glucose-Capillary: 136 mg/dL — ABNORMAL HIGH (ref 70–99)
Glucose-Capillary: 138 mg/dL — ABNORMAL HIGH (ref 70–99)
Glucose-Capillary: 141 mg/dL — ABNORMAL HIGH (ref 70–99)
Glucose-Capillary: 158 mg/dL — ABNORMAL HIGH (ref 70–99)
Glucose-Capillary: 160 mg/dL — ABNORMAL HIGH (ref 70–99)

## 2020-09-10 MED ORDER — JEVITY 1.5 CAL/FIBER PO LIQD
1000.0000 mL | ORAL | Status: DC
  Filled 2020-09-10: qty 1000

## 2020-09-10 MED ORDER — PROSOURCE TF PO LIQD
90.0000 mL | Freq: Two times a day (BID) | ORAL | Status: DC
  Administered 2020-09-10 – 2020-09-14 (×8): 90 mL
  Filled 2020-09-10 (×8): qty 90

## 2020-09-10 MED ORDER — JEVITY 1.5 CAL/FIBER PO LIQD
1020.0000 mL | ORAL | Status: DC
  Administered 2020-09-10 – 2020-09-13 (×4): 1020 mL
  Filled 2020-09-10 (×6): qty 2000

## 2020-09-10 NOTE — Progress Notes (Signed)
PROGRESS NOTE   Subjective/Complaints: Slept much better last night by chart and nurse report. Pt fairly calm in bed this AM  ROS: Limited due to cognitive/behavioral   Objective:   No results found. Recent Labs    09/08/20 0344  WBC 11.8*  HGB 11.6*  HCT 35.8*  PLT 197      Recent Labs    09/08/20 0344  NA 138  K 3.9  CL 106  CO2 27  GLUCOSE 156*  BUN 28*  CREATININE 0.92  CALCIUM 8.9     Intake/Output Summary (Last 24 hours) at 09/10/2020 4982 Last data filed at 09/09/2020 1700 Gross per 24 hour  Intake 140 ml  Output --  Net 140 ml         Physical Exam: Vital Signs Blood pressure 133/84, pulse 92, temperature 98.8 F (37.1 C), temperature source Oral, resp. rate 17, height 5\' 7"  (1.702 m), weight 86 kg, SpO2 100 %.  Constitutional: No distress . Vital signs reviewed. HEENT: EOMI, oral membranes moist Neck: supple Cardiovascular: RRR without murmur. No JVD    Respiratory/Chest: CTA Bilaterally without wheezes or rales. Normal effort    GI/Abdomen: BS +, non-tender, non-distended Ext: no clubbing, cyanosis, or edema Psych: calm, distracted Skin: No evidence of breakdown, no evidence of rash Musculoskeletal:    Cervical back: no axial or limb pain     Skin:    Comments: bruising Neurological: awakens easily. Makes eye contact. Right gaze preference.  Moves all 4's right more than left.       Assessment/Plan: 1. Functional deficits which require 3+ hours per day of interdisciplinary therapy in a comprehensive inpatient rehab setting. Physiatrist is providing close team supervision and 24 hour management of active medical problems listed below. Physiatrist and rehab team continue to assess barriers to discharge/monitor patient progress toward functional and medical goals  Care Tool:  Bathing    Body parts bathed by patient: Chest, Abdomen, Front perineal area, Face, Right arm, Left arm    Body parts bathed by helper: Buttocks, Left upper leg, Right upper leg, Right lower leg, Left lower leg     Bathing assist Assist Level: Moderate Assistance - Patient 50 - 74%     Upper Body Dressing/Undressing Upper body dressing   What is the patient wearing?: Pull over shirt    Upper body assist Assist Level: Maximal Assistance - Patient 25 - 49%    Lower Body Dressing/Undressing Lower body dressing      What is the patient wearing?: Pants     Lower body assist Assist for lower body dressing: Total Assistance - Patient < 25%     Toileting Toileting    Toileting assist Assist for toileting: Total Assistance - Patient < 25%     Transfers Chair/bed transfer  Transfers assist     Chair/bed transfer assist level: 2 Helpers     Locomotion Ambulation   Ambulation assist   Ambulation activity did not occur: Safety/medical concerns  Assist level: 2 helpers Assistive device: Walker-rolling Max distance: 10   Walk 10 feet activity   Assist  Walk 10 feet activity did not occur: Safety/medical concerns  Assist level: 2 helpers Assistive  device: Walker-rolling   Walk 50 feet activity   Assist Walk 50 feet with 2 turns activity did not occur: Safety/medical concerns  Assist level: Maximal Assistance - Patient 25 - 49% Assistive device: Walker-rolling    Walk 150 feet activity   Assist Walk 150 feet activity did not occur: Safety/medical concerns         Walk 10 feet on uneven surface  activity   Assist Walk 10 feet on uneven surfaces activity did not occur: Safety/medical concerns         Wheelchair     Assist     Wheelchair activity did not occur: Safety/medical concerns         Wheelchair 50 feet with 2 turns activity    Assist    Wheelchair 50 feet with 2 turns activity did not occur: Safety/medical concerns       Wheelchair 150 feet activity     Assist  Wheelchair 150 feet activity did not occur:  Safety/medical concerns       Blood pressure 133/84, pulse 92, temperature 98.8 F (37.1 C), temperature source Oral, resp. rate 17, height 5\' 7"  (1.702 m), weight 86 kg, SpO2 100 %.  Medical Problem List and Plan: 1.  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  Impaired mobility -DVT/anticoagulation:  Eliquis restarted 6/21-pharmacy indicates increased cost to patient. More economical option is xarelto. Asked pharmacy to review with Mrs. Kercheval. She will want to speak with cardiology as well.              -antiplatelet therapy: Continue ASA 325 mg/day 3. Pain Management: N/A 4. Mood: LCSW to follow for evaluation and support.              -antipsychotic agents: N/A 5. Neuropsych: This patient is not capable of making decisions on his own behalf. 6. Skin/Wound Care: Routine pressure relief measures.  7. Fluids/Electrolytes/Nutrition: NPO with tube feeds.             -continue reduced water flushes to 100 cc every 4 hours -7/5 Na 138 7/5, BUN/Cr climbing--increased flushes to 150cc Recheck bMET tomorrow 8. HTN: Monitor BP tid with SBP goal <140/90.              --continue losartan/day, cardura/day and Metoprolol every 6 hours Vitals:   09/09/20 2003 09/10/20 0444  BP: 129/89 133/84  Pulse: 87 92  Resp: 18 17  Temp: 99.5 F (37.5 C) 98.8 F (37.1 C)  SpO2: 100% 100%  7/7 controlled  9. T2DM: Hgb A1C- 6.2.   CBG (last 3)  Recent Labs    09/10/20 0433 09/10/20 0801 09/10/20 0825  GLUCAP 130* 158* 138*  Controlled 7/7  10. Lethargy: improved -continue 200mg  amantadine -rx UTI   11. PAF: Monitor HR tid--eliquis restarted --SDH post fall 07/27/20             --continue Tikosyn bid             --appreciate pharmacy help to keep K+>4.0/Mg levels   7/6 rate has  been generally controlled 12. Thoracic aortic aneurysm: Followed by [facility]. 14. Hypokalemia: increased repletion to 34meq BID   -3.9 7/5   15.  UTI: completed kelfex course 6/26.  -6/30 ucx with 100k pseudomonas--- levaquin for 7 day course  7/3 mental status has improved with rx 16. Abdominal distention: resolved  -tolerating TF currently  -dc'ed miralax,     Spoke with wife reviewed chart   17.  Insomnia:   -pt was a night owl PTA, typically staying up to 0200 and waking at 1100 each day  -improved somewhat with 100mg  trazodone 18. Seizure activity: 90 second episode that self resolved on 6/24. Neuro consulted, appreciate recs, IV Ativan PRN, Keppra, seizure precautions  -none since keppra initiated  19. Dysphagia:advanced to D1/honeys by SLP  -mbs 6/29 with moderate oral dysphagia, some pharyngeal component as well  -observe for intake. Continue same NG feeds for now until he demonstrates better initiation with feeding   LOS: 26 days A FACE TO [place] 09/10/2020, 9:22 AM

## 2020-09-10 NOTE — Progress Notes (Signed)
Nutrition Follow-up  DOCUMENTATION CODES:   Not applicable  INTERVENTION:  Provide Magic cup TID with meals, each supplement provides 290 kcal and 9 grams of protein.  Provide nocturnal tube feeds using Jevity 1.5 cal formula via Cortrak NGT at rate of 85 ml/hr x 12 hours (7pm-7am).   Provide 90 ml Prosource TF BID per tube.   Free water flushes of 150 ml q 4 hours per tube (MD to adjust as appropriate)  Tube feeding regimen provides 1690 kcal (85% of kcal needs), 109 grams of protein (95% of protein needs), and 1675 ml free water.   NUTRITION DIAGNOSIS:   Inadequate oral intake related to inability to eat as evidenced by NPO status; ongoing  GOAL:   Patient will meet greater than or equal to 90% of their needs; met with TF  MONITOR:   PO intake, Supplement acceptance, Diet advancement, TF tolerance, Skin, Weight trends, Labs, I & O's  REASON FOR ASSESSMENT:   New TF    ASSESSMENT:   80 yo male with a PMH of diastolic CHF, AVR/MVR, A-fib on coumadin/Tiksoyn who was admitted on 5/23 for difficulty speaking and R sided weakness after a fall the day before. 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 He was intubated for airway protection and family elected on DNR with comfort care due to poor prognosis.  However, patient started showing ability to follow commands on 5/24 with repeat CT showing persistent bilateral SDH.  He was taken to the OR for right craniotomy for evacuation of SDH. He tolerated extubation without difficulty but was limited by left greater than right-sided weakness with visual deficits, strong posterior bias as well as poor safety awareness with cognitive deficits.  He was admitted to rehab on 5/27 for intensive rehab program to consist of PT OT and speech therapy.  Diet advanced to a dysphagia 1 diet with honey thick liquids. Meal completion has been 0-30%. Family at bedside has been encouraging pt po at meals. Tube feed have  been modified to infuse nocturnally to help encourage PO intake during the day. Magic cup to be ordered on all meal trays to aid in PO.   Labs and medications reviewed.   Diet Order:   Diet Order             DIET - DYS 1 Room service appropriate? Yes; Fluid consistency: Honey Thick  Diet effective 1000                   EDUCATION NEEDS:   Education needs have been addressed  Skin:  Skin Assessment: Reviewed RN Assessment  Last BM:  7/6  Height:   Ht Readings from Last 1 Encounters:  08/17/20 $RemoveB'5\' 7"'[id number]$  (1.702 m)    Weight:   Wt Readings from Last 1 Encounters:  08/20/20 86 kg   BMI:  Body mass index is 29.69 kg/m.  Estimated Nutritional Needs:   Kcal:  2000-2200  Protein:  115-130 grams  Fluid:  >2 L  Corrin Parker, MS, RD, LDN RD pager number/after hours weekend pager number on Amion.

## 2020-09-10 NOTE — Progress Notes (Signed)
Occupational Therapy Session Note  Patient Details  Name: [name] [name] MRN: [id number] Date of Birth: 01-18-1941  Today's Date: 09/10/2020 OT Individual Time: 1400-1455 OT Individual Time Calculation (min): 55 min     Skilled Therapeutic Interventions/Progress Updates:    Patient seated in bed, wife present, nurse tech assisting with lunch meal.  Patient able to feed himself and drink with CS and occ cues for rate and amount per bite.  Mod A to clean hands after meal completion.  Supine to sitting edge of bed with min A and increased time.  Sit to stand and SPT bed to wc with min A+2 for safety.  Completed ambulation in hallway 20 feet first attempt min A x2 and > 150 feet 2nd.  Patient indicated that he needed to void - max cues to get to bathroom prior to pulling pants down, mod A of 2 to pivot to toilet, max A to doff shorts and brief as he had already had BM and emptied bladder.  Max A for hygiene and to donn clean brief and shorts.  Min/mod A of 2 ambulation back to edge of bed and then to w/c.  He responds well to simple directions with increased time but needs increased redirection for bathroom management.  He remained seated in w/c with seat belt on and wife with close supervision awaiting PT session in 5 minutes.    Therapy Documentation Precautions:  Precautions Precautions: Fall Precaution Comments: cortrak, confusion, decreased ability to follow instructional commands Restrictions Weight Bearing Restrictions: No   Therapy/Group: Individual Therapy  Carlos Levering 09/10/2020, 7:22 AM

## 2020-09-10 NOTE — Progress Notes (Signed)
Speech Language Pathology Daily Session Note  Patient Details  Name: [name] [name] MRN: [id number] Date of Birth: June 13, 1940  Today's Date: 09/10/2020 SLP Individual Time: 0815-0905 SLP Individual Time Calculation (min): 50 min  Short Term Goals: Week 4: SLP Short Term Goal 1 (Week 4): STGs=LTGs due to ELOS   Skilled Therapeutic Interventions: Skilled treatment session focused on dysphagia and cognitive goals. SLP facilitated session by providing Max-Total A multimodal cues for initiation and focused attention to breakfast meal of Dys. 1 textures with honey-thick liquids. Patient was extremely distracted today and was constantly reaching and grabbing for items that were not within his visual field. Because of this, SLP had to dependently feed the patient. Patient with intermittent overt coughing due to poor awareness of bolus, therefore, breakfast was discontinued. Recommend patient continue current diet. Patient was essentially nonverbal throughout session and required Max A multimodal cues to follow commands. Patient left upright in bed with alarm on, restraints in place and all needs within reach. Continue with current plan of care.      Pain No/Denies Pain   Therapy/Group: Individual Therapy  Najir Roop 09/10/2020, 3:38 PM

## 2020-09-10 NOTE — Progress Notes (Signed)
Physical Therapy Session Note  Patient Details  Name: [name] [name] MRN: [id number] Date of Birth: 12/09/1940  Today's Date: 09/10/2020 PT Individual Time: 1120-1155 and 1500-1550 PT Individual Time Calculation (min): 35 min and 50 min   Short Term Goals: Week 4:  PT Short Term Goal 1 (Week 4): Patient will perform bed mobility with mod-min A of 1 person consistently. PT Short Term Goal 2 (Week 4): Patient will ambulate >100 feet with mod A >75% of the time and +2 w/c follow for safety  using LRAD. PT Short Term Goal 3 (Week 4): Patient will demonstrate sustained attention to a task in standing >4 min with mod cues for attention and min A for standing balance. PT Short Term Goal 4 (Week 4): Patient will perform basic transfers with mod-min A of 1 person with min verbal cues.  Skilled Therapeutic Interventions/Progress Updates:     Session 1: Patient in bed asleep with his wife at bedside upon PT arrival. Patient slow to arouse and reported, "I feel good."  Donned shorts bed level, provided max cues and hand over hand to have patient initiate bridging and grab his pants to pull them up. Patient attempted x1, then did not participate in this again despite encouragement. Performed rolling R/L with mod A to pull his pants up with total A. Performed multiple attempts with variable cues and provided time for processing and initiation for patient to perform supine to sit. Patient would roll to his side, but would actively resist bringing his legs off the bed and attempts to bring his trunk upright. Patient denied pain and nodded yes when asked if he felt fatigued. Per sleep chart, patient slept throughout the night last night. Patient in side-lying with his eyes closed. Asked if the patient felt more comfortable in side-lying, he nodded yes while keeping his eyes closed. Per patient's wife, he had been in restraints and on his back all night and this morning, Patient calm and not restless at this time.  Patient's wife agreeable to sit in front of the patient an monitor him for restlessness or reaching for his NG tube. Opted to leave the soft wrist restraints and hand mitts off to allow patient to rest in side-lying for pressure relief and improved quality of sleep with his wife provided close 1-1 supervision, bed alarm set with 4 rails up for patient safety at end of session, RN made aware and in agreement with this decision to leave restraints off with plans to check on the patient in 30 min if the wife has not called.   Session 2: Patient in TIS w/c with seat belt secure and his wife providing 1-1 supervision upon PT arrival. Patient alert throughout session and tolerated making up missed minutes from this morning. Patient's wife reports that the patient did not need to have the restraints placed following PT session this morning and rested well in side-lying with improved performance with OT this afternoon.  Discussed d/c planning with patient and his wife, patient's wife asking to speak with Dr. Naaman Plummer about d/c, MD unavailable, Jeannene Patella, PA made aware and message left with therapy team to relay the message about her request.   Therapeutic Activity: Bed Mobility: Patient performed sit to supine with supervision and mod multimodal cues and >30 seconds to initiation.  Transfers: Patient performed sit to/from stand x2 and stand pivot x1 with min/mod A using HHA. Provided mod multimodal cues for initiation and pushing up from the chair rather than reaching out for a  second person.  Gait Training:  Patient ambulated 20 feet using HHA with min/mod A with 2 cones as visual targets. He then followed cues to turn 180 deg to ambulate back to the w/c, however, became distracted by the NuStep chair and was unable to be redirected back to the w/c, w/c was brought up behind him for safety. Ambulated with shuffle gait pattern initially, demonstrating cautious stepping without second person assist, however, progressed  to taking larger steps with increased foot clearance with min cues for gait pattern and mod cues for attending to visual target, promoting forward propulsion.   Following gait training, patient mimed brushing his teeth, PT asked patient to state what he wanted and patient stated clearly "I want to brush my teeth." Returned to the room via TIS w/c with total A. Provided set-up for oral care with suction, patient performed oral care with supervision with min cues for thoroughness. Patient began to spit on the floor after, as if at the sink, PT provided regular suction for secretions with min cues for use of suction device.   Patient in resting calmly in the bed with his wife at bedside providing 1-1 supervision with restraints off at end of session with breaks locked, bed alarm set, and all needs within reach.    Therapy Documentation Precautions:  Precautions Precautions: Fall Precaution Comments: cortrak, confusion, decreased ability to follow instructional commands Restrictions Weight Bearing Restrictions: No    Therapy/Group: Individual Therapy  Dravin Lance L Maicol Bowland PT, DPT  09/10/2020, 5:33 PM

## 2020-09-11 LAB — GLUCOSE, CAPILLARY
Glucose-Capillary: 128 mg/dL — ABNORMAL HIGH (ref 70–99)
Glucose-Capillary: 112 mg/dL — ABNORMAL HIGH (ref 70–99)
Glucose-Capillary: 111 mg/dL — ABNORMAL HIGH (ref 70–99)
Glucose-Capillary: 120 mg/dL — ABNORMAL HIGH (ref 70–99)
Glucose-Capillary: 112 mg/dL — ABNORMAL HIGH (ref 70–99)
Glucose-Capillary: 127 mg/dL — ABNORMAL HIGH (ref 70–99)

## 2020-09-11 LAB — BASIC METABOLIC PANEL
Anion gap: 5 (ref 5–15)
BUN: 27 mg/dL — ABNORMAL HIGH (ref 8–23)
CO2: 27 mmol/L (ref 22–32)
Calcium: 8.7 mg/dL — ABNORMAL LOW (ref 8.9–10.3)
Chloride: 109 mmol/L (ref 98–111)
Creatinine, Ser: 0.85 mg/dL (ref 0.61–1.24)
GFR, Estimated: >60 mL/min (ref >60)
Glucose, Bld: 109 mg/dL — ABNORMAL HIGH (ref 70–99)
Potassium: 4 mmol/L (ref 3.5–5.1)
Sodium: 141 mmol/L (ref 135–145)

## 2020-09-11 LAB — CBC
HCT: 35 % — ABNORMAL LOW (ref 39.0–52.0)
Hemoglobin: 11.3 g/dL — ABNORMAL LOW (ref 13.0–17.0)
MCH: 30.6 pg (ref 26.0–34.0)
MCHC: 32.3 g/dL (ref 30.0–36.0)
MCV: 94.9 fL (ref 80.0–100.0)
Platelets: 189 10*3/uL (ref 150–400)
RBC: 3.69 MIL/uL — ABNORMAL LOW (ref 4.22–5.81)
RDW: 14.5 % (ref 11.5–15.5)
WBC: 10.8 10*3/uL — ABNORMAL HIGH (ref 4.0–10.5)
nRBC: 0 % (ref 0.0–0.2)

## 2020-09-11 NOTE — Progress Notes (Signed)
Speech Language Pathology Daily Session Note  Patient Details  Name: [name] [name] MRN: [id number] Date of Birth: [birth year]  Today's Date: 09/11/2020 SLP Individual Time: 0800-0900 SLP Individual Time Calculation (min): 60 min  Short Term Goals: Week 4: SLP Short Term Goal 1 (Week 4): STGs=LTGs due to ELOS  Skilled Therapeutic Interventions: Skilled treatment session focused on dysphagia and cognitive goals. SLP facilitated session by providing overall Min-Mod A verbal cues for initiation and sustained attention to self-feeding with breakfast meal of Dys. 1 textures with thin liquids. Patient consumed meal without overt s/s of aspiration and Min verbal and visual cues for use of swallowing compensatory strategies. Due to improved attention to task, patient with improved PO intake this meal. Patient with increased difficulty managing his cup today but appeared to do mildly better when liquids were placed in a mug. Recommend patient continue current diet. Patient with increased verbal expression today with improved vocal intensity, however, patient with language of confusion. Patient restless towards end of session but redirected with extra time. Patient left upright in bed with restraints in place and all needs within reach. Continue with current plan of care.      Pain Pain Assessment Pain Scale: Faces Faces Pain Scale: No hurt  Therapy/Group: Individual Therapy  Davonne Baby 09/11/2020, 10:09 AM

## 2020-09-11 NOTE — Progress Notes (Signed)
Occupational Therapy Session Note  Patient Details  Name: [name] [name] MRN: [id number] Date of Birth: 1940-09-20  Today's Date: 09/11/2020 OT Individual Time: 9728-2060 OT Individual Time Calculation (min): 75 min    Short Term Goals: Week 4:  OT Short Term Goal 1 (Week 4): STG= LTG d/t ELOS  Skilled Therapeutic Interventions/Progress Updates:    Pt received supine with no indications of pain, wife present. Session focused on command following, initiation/termination of motor plan, and sequencing in the context of shower level ADLs and toileting. When attending to task, pt was able to complete ambulatory transfers with as little as min A, but when distracted he required mod+2 for safety. During toileting tasks pt was able to initiate peri hygiene without cueing, but required max cueing to terminate. Shower completed with TTB- incredibly unsafe as pt was too distracted and required frequent heavy facilitation from OT to remain seated. Max-total A overall for bathing. Pt was constantly internally/externally distracted, pulling at NG tube, and anything within reach. He required max multimodal cueing to follow commands and often max A for safety with transfers and ADLs. Max A to don shirt and pants. Pt was assisted back to bed and left supine, B wrist restraints on. Extensive conversation with pt's wife re CLOF, goals being downgraded, SNF d/c, safety, and expectation management. She frequently asks for an extension and edu was provided re inappropriateness and lack of progress from an OT standpoint only. Passed along request to speak with MD.   Therapy Documentation Precautions:  Precautions Precautions: Fall Precaution Comments: cortrak, confusion, decreased ability to follow instructional commands Restrictions Weight Bearing Restrictions: No   Therapy/Group: Individual Therapy  Curtis Sites 09/11/2020, 6:38 AM

## 2020-09-11 NOTE — Progress Notes (Signed)
Physical Therapy Session Note  Patient Details  Name: [name] [name] MRN: [id number] Date of Birth: 16-Mar-1940  Today's Date: 09/11/2020 PT Individual Time:  -      Short Term Goals: Week 1:  PT Short Term Goal 1 (Week 1): Pt will perform supine<>sit with mod-max A of 1 person consistently. PT Short Term Goal 1 - Progress (Week 1): Met PT Short Term Goal 2 (Week 1): Pt will perform sit<>stands using LRAD with mod A +2 PT Short Term Goal 2 - Progress (Week 1): Met PT Short Term Goal 3 (Week 1): Pt will perform bed<>chair transfers using LRAD with max A of 1 person PT Short Term Goal 3 - Progress (Week 1): Met PT Short Term Goal 4 (Week 1): Patient will initiation gait training PT Short Term Goal 4 - Progress (Week 1): Met PT Short Term Goal 5 (Week 1): Patient will perform sitting balance with mod A consistently PT Short Term Goal 5 - Progress (Week 1): Progressing toward goal Week 2:  PT Short Term Goal 1 (Week 2): Patient will perform sitting balance with mod A consistently. PT Short Term Goal 1 - Progress (Week 2): Met PT Short Term Goal 2 (Week 2): Patient will tolerate safely sitting OOB >30 min 3/7 days this week. PT Short Term Goal 2 - Progress (Week 2): Met PT Short Term Goal 3 (Week 2): Patient will perform basic transfers with mod A and mod cues consistently. PT Short Term Goal 3 - Progress (Week 2): Met PT Short Term Goal 4 (Week 2): Patient will ambulate >100 ft with mod A using LRAD consistently. PT Short Term Goal 4 - Progress (Week 2): Progressing toward goal Week 3:  PT Short Term Goal 1 (Week 3): Patient will perform bed mobility with mod-min A of 1 person consistently. PT Short Term Goal 1 - Progress (Week 3): Progressing toward goal PT Short Term Goal 2 (Week 3): Patient will perform basic transfers with mod-min A of 1 person with min verbal cues. PT Short Term Goal 2 - Progress (Week 3): Progressing toward goal PT Short Term Goal 3 (Week 3): Patient will ambulate >100  feet with mod A and +2 w/c follow for safety consistently using LRAD. PT Short Term Goal 3 - Progress (Week 3): Progressing toward goal PT Short Term Goal 4 (Week 3): Patient will initiate stair training for strengthening and endurance with a functional task. PT Short Term Goal 4 - Progress (Week 3): Progressing toward goal Week 4:  PT Short Term Goal 1 (Week 4): Patient will perform bed mobility with mod-min A of 1 person consistently. PT Short Term Goal 2 (Week 4): Patient will ambulate >100 feet with mod A >75% of the time and +2 w/c follow for safety  using LRAD. PT Short Term Goal 3 (Week 4): Patient will demonstrate sustained attention to a task in standing >4 min with mod cues for attention and min A for standing balance. PT Short Term Goal 4 (Week 4): Patient will perform basic transfers with mod-min A of 1 person with min verbal cues. Week 5:     Skilled Therapeutic Interventions/Progress Updates:    Therapist unable to awaken pt for session.  Wife at bedside reports pt just received shower and was very difficult/fatiguing.  Will attempt later in day if schedule permits.   Therapy Documentation Precautions:  Precautions Precautions: Fall Precaution Comments: cortrak, confusion, decreased ability to follow instructional commands Restrictions Weight Bearing Restrictions: No   Therapy/Group: Individual Therapy  Callie Fielding, PT   Jerrilyn Cairo 09/11/2020, 8:44 AM

## 2020-09-11 NOTE — Progress Notes (Signed)
Patient ID: [name] [name], male   DOB: 23-Jan-1941, 80 y.o.   MRN: [id number]  Covering for primary SW, Auria,  SW called Butch Penny at [facility] to provide updates on the status of pt transfer to SNF. Left VM, waiting for follow up  Erlene Quan, [place]

## 2020-09-11 NOTE — Progress Notes (Addendum)
PROGRESS NOTE   Subjective/Complaints: Had a fair night. Resting when I entered this morning  ROS: Limited due to cognitive/behavioral   Objective:   No results found. Recent Labs    09/11/20 0402  WBC 10.8*  HGB 11.3*  HCT 35.0*  PLT 189      Recent Labs    09/11/20 0402  NA 141  K 4.0  CL 109  CO2 27  GLUCOSE 109*  BUN 27*  CREATININE 0.85  CALCIUM 8.7*     Intake/Output Summary (Last 24 hours) at 09/11/2020 0851 Last data filed at 09/10/2020 1735 Gross per 24 hour  Intake 100 ml  Output 200 ml  Net -100 ml         Physical Exam: Vital Signs Blood pressure 119/90, pulse 78, temperature 98.3 F (36.8 C), resp. rate 16, height 5\' 7"  (1.702 m), weight 86 kg, SpO2 95 %.  Constitutional: No distress . Vital signs reviewed. HEENT: EOMI, oral membranes moist, NGT Neck: supple Cardiovascular: RRR without murmur. No JVD    Respiratory/Chest: CTA Bilaterally without wheezes or rales. Normal effort    GI/Abdomen: BS +, non-tender, non-distended Ext: no clubbing, cyanosis, or edema Psych: cooperative,  Skin: No evidence of breakdown, no evidence of rash Musculoskeletal:    Cervical back: no axial or limb pain     Skin:    Comments: bruising Neurological: awoke easily. Makes eye contact. Right gaze preference.  Moves all 4's with preference toward right side.       Assessment/Plan: 1. Functional deficits which require 3+ hours per day of interdisciplinary therapy in a comprehensive inpatient rehab setting. Physiatrist is providing close team supervision and 24 hour management of active medical problems listed below. Physiatrist and rehab team continue to assess barriers to discharge/monitor patient progress toward functional and medical goals  Care Tool:  Bathing    Body parts bathed by patient: Chest, Abdomen, Front perineal area, Face, Right arm, Left arm   Body parts bathed by helper:  Buttocks, Left upper leg, Right upper leg, Right lower leg, Left lower leg     Bathing assist Assist Level: Moderate Assistance - Patient 50 - 74%     Upper Body Dressing/Undressing Upper body dressing   What is the patient wearing?: Pull over shirt    Upper body assist Assist Level: Maximal Assistance - Patient 25 - 49%    Lower Body Dressing/Undressing Lower body dressing      What is the patient wearing?: Pants     Lower body assist Assist for lower body dressing: Total Assistance - Patient < 25%     Toileting Toileting    Toileting assist Assist for toileting: Total Assistance - Patient < 25%     Transfers Chair/bed transfer  Transfers assist     Chair/bed transfer assist level: 2 Helpers     Locomotion Ambulation   Ambulation assist   Ambulation activity did not occur: Safety/medical concerns  Assist level: 2 helpers Assistive device: Walker-rolling Max distance: 10   Walk 10 feet activity   Assist  Walk 10 feet activity did not occur: Safety/medical concerns  Assist level: 2 helpers Assistive device: Walker-rolling   Walk 50 feet  activity   Assist Walk 50 feet with 2 turns activity did not occur: Safety/medical concerns  Assist level: Maximal Assistance - Patient 25 - 49% Assistive device: Walker-rolling    Walk 150 feet activity   Assist Walk 150 feet activity did not occur: Safety/medical concerns         Walk 10 feet on uneven surface  activity   Assist Walk 10 feet on uneven surfaces activity did not occur: Safety/medical concerns         Wheelchair     Assist     Wheelchair activity did not occur: Safety/medical concerns         Wheelchair 50 feet with 2 turns activity    Assist    Wheelchair 50 feet with 2 turns activity did not occur: Safety/medical concerns       Wheelchair 150 feet activity     Assist  Wheelchair 150 feet activity did not occur: Safety/medical concerns       Blood  pressure 119/90, pulse 78, temperature 98.3 F (36.8 C), resp. rate 16, height 5\' 7"  (1.702 m), weight 86 kg, SpO2 95 %.  Medical Problem List and Plan: 1.  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  Impaired mobility -DVT/anticoagulation:  Eliquis restarted 6/21.              -antiplatelet therapy: DC ASA 325 mg/day, confirmed with neurology that it's not necessary given that he's on eliquis 3. Pain Management: N/A 4. Mood: LCSW to follow for evaluation and support.              -antipsychotic agents: N/A 5. Neuropsych: This patient is not capable of making decisions on his own behalf. 6. Skin/Wound Care: Routine pressure relief measures.  7. Fluids/Electrolytes/Nutrition: NPO with tube feeds.             -7/5 increased water flushes to 150 cc every 4 hours given rising BUN -7/8 NA+ 141, BUN/Cr stable to improved  -maintain flushes at 150cc q4 hours  -eating more, will reduce TF to stimulate appetite 8. HTN: Monitor BP tid with SBP goal <140/90.              --continue losartan/day, cardura/day and Metoprolol every 6 hours Vitals:   09/10/20 2012 09/11/20 0626  BP: 120/82 119/90  Pulse: 88 78  Resp: 20 16  Temp: 98.5 F (36.9 C) 98.3 F (36.8 C)  SpO2: 99% 95%  7/8 controlled  9. T2DM: Hgb A1C- 6.2.   CBG (last 3)  Recent Labs    09/11/20 0225 09/11/20 0459 09/11/20 0755  GLUCAP 128* 112* 111*  Controlled 7/8  10. Lethargy: improved -continue 200mg  amantadine -rx UTI   11. PAF: Monitor HR tid--eliquis restarted --SDH post fall 07/27/20             --continue Tikosyn bid             --appreciate pharmacy help to keep K+>4.0/Mg levels   7/6 rate has been generally controlled 12. Thoracic aortic aneurysm: Followed by [facility]. 14. Hypokalemia:  increased repletion to 56meq BID   -3.9 7/5   15. UTI: completed kelfex course 6/26.  -6/30 ucx with 100k  pseudomonas--- levaquin for 7 day course  7/3 mental status has improved with rx 16. Abdominal distention: resolved  -tolerating TF currently  -dc'ed miralax,     Spoke with wife reviewed chart   17.  Insomnia:   -pt was a night owl PTA, typically staying up to 0200 and waking at 1100 each day  -improved somewhat with 100mg  trazodone 18. Seizure activity: 90 second episode that self resolved on 6/24. Neuro consulted, appreciate recs, IV Ativan PRN, Keppra, seizure precautions  -none since keppra initiated  19. Dysphagia:currently D1/honeys by SLP  -mbs 6/29 with moderate oral dysphagia, some pharyngeal component as well  -continue NGT   LOS: 27 days A FACE TO FACE EVALUATION WAS PERFORMED  Meredith Staggers 09/11/2020, 8:51 AM

## 2020-09-12 LAB — GLUCOSE, CAPILLARY
Glucose-Capillary: 115 mg/dL — ABNORMAL HIGH (ref 70–99)
Glucose-Capillary: 115 mg/dL — ABNORMAL HIGH (ref 70–99)
Glucose-Capillary: 132 mg/dL — ABNORMAL HIGH (ref 70–99)
Glucose-Capillary: 147 mg/dL — ABNORMAL HIGH (ref 70–99)
Glucose-Capillary: 150 mg/dL — ABNORMAL HIGH (ref 70–99)
Glucose-Capillary: 157 mg/dL — ABNORMAL HIGH (ref 70–99)

## 2020-09-12 NOTE — Progress Notes (Signed)
PROGRESS NOTE   Subjective/Complaints: Resting, awake Patient's chart reviewed- No issues reported overnight BP soft  ROS: Limited due to cognitive/behavioral   Objective:   No results found. Recent Labs    09/11/20 0402  WBC 10.8*  HGB 11.3*  HCT 35.0*  PLT 189      Recent Labs    09/11/20 0402  NA 141  K 4.0  CL 109  CO2 27  GLUCOSE 109*  BUN 27*  CREATININE 0.85  CALCIUM 8.7*     Intake/Output Summary (Last 24 hours) at 09/12/2020 1332 Last data filed at 09/12/2020 1306 Gross per 24 hour  Intake 240 ml  Output --  Net 240 ml         Physical Exam: Vital Signs Blood pressure (!) 101/55, pulse 66, temperature 98.1 F (36.7 C), resp. rate 18, height 5\' 7"  (1.702 m), weight 86 kg, SpO2 95 %. Gen: no distress, normal appearing HEENT: oral mucosa pink and moist, NCAT Cardio: Reg rate Chest: normal effort, normal rate of breathing Abd: soft, non-distended Ext: no edema Psych: pleasant, normal affect  Skin: No evidence of breakdown, no evidence of rash Musculoskeletal:    Cervical back: no axial or limb pain     Skin:    Comments: bruising Neurological: awoke easily. Makes eye contact. Right gaze preference.  Moves all 4's with preference toward right side.       Assessment/Plan: 1. Functional deficits which require 3+ hours per day of interdisciplinary therapy in a comprehensive inpatient rehab setting. Physiatrist is providing close team supervision and 24 hour management of active medical problems listed below. Physiatrist and rehab team continue to assess barriers to discharge/monitor patient progress toward functional and medical goals  Care Tool:  Bathing    Body parts bathed by patient: Face   Body parts bathed by helper: Buttocks, Left upper leg, Right upper leg, Right lower leg, Left lower leg, Right arm, Left arm, Chest, Abdomen, Front perineal area     Bathing assist Assist  Level: Maximal Assistance - Patient 24 - 49%     Upper Body Dressing/Undressing Upper body dressing   What is the patient wearing?: Pull over shirt    Upper body assist Assist Level: Maximal Assistance - Patient 25 - 49%    Lower Body Dressing/Undressing Lower body dressing      What is the patient wearing?: Pants     Lower body assist Assist for lower body dressing: Total Assistance - Patient < 25%     Toileting Toileting    Toileting assist Assist for toileting: Moderate Assistance - Patient 50 - 74%     Transfers Chair/bed transfer  Transfers assist     Chair/bed transfer assist level: 2 Helpers     Locomotion Ambulation   Ambulation assist   Ambulation activity did not occur: Safety/medical concerns  Assist level: 2 helpers Assistive device: Walker-rolling Max distance: 10   Walk 10 feet activity   Assist  Walk 10 feet activity did not occur: Safety/medical concerns  Assist level: 2 helpers Assistive device: Walker-rolling   Walk 50 feet activity   Assist Walk 50 feet with 2 turns activity did not occur: Safety/medical concerns  Assist level: Maximal Assistance - Patient 25 - 49% Assistive device: Walker-rolling    Walk 150 feet activity   Assist Walk 150 feet activity did not occur: Safety/medical concerns         Walk 10 feet on uneven surface  activity   Assist Walk 10 feet on uneven surfaces activity did not occur: Safety/medical concerns         Wheelchair     Assist     Wheelchair activity did not occur: Safety/medical concerns         Wheelchair 50 feet with 2 turns activity    Assist    Wheelchair 50 feet with 2 turns activity did not occur: Safety/medical concerns       Wheelchair 150 feet activity     Assist  Wheelchair 150 feet activity did not occur: Safety/medical concerns       Blood pressure (!) 101/55, pulse 66, temperature 98.1 F (36.7 C), resp. rate 18, height 5\' 7"  (1.702  m), weight 86 kg, SpO2 95 %.  Medical Problem List and Plan: 1.  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  Impaired mobility -DVT/anticoagulation:  Eliquis restarted 6/21.              -antiplatelet therapy: DC ASA 325 mg/day, confirmed with neurology that it's not necessary given that he's on eliquis 3. Pain Management: N/A 4. Mood: LCSW to follow for evaluation and support.              -antipsychotic agents: N/A 5. Neuropsych: This patient is not capable of making decisions on his own behalf. 6. Skin/Wound Care: Routine pressure relief measures.  7. Fluids/Electrolytes/Nutrition: NPO with tube feeds.             -7/5 increased water flushes to 150 cc every 4 hours given rising BUN -7/8 NA+ 141, BUN/Cr stable to improved  -maintain flushes at 150cc q4 hours  -eating more, will reduce TF to stimulate appetite 8. HTN: Monitor BP tid with SBP goal <140/90.              --continue losartan/day, cardura/day and Metoprolol every 6 hours Vitals:   09/11/20 1932 09/12/20 0407  BP: 138/79 (!) 101/55  Pulse: 95 66  Resp: 18 18  Temp: 98.1 F (36.7 C) 98.1 F (36.7 C)  SpO2: 96% 95%  7/8 controlled  9. T2DM: Hgb A1C- 6.2.   CBG (last 3)  Recent Labs    09/12/20 0402 09/12/20 0733 09/12/20 1127  GLUCAP 157* 115* 132*  Controlled 7/9  10. Lethargy: improved -continue 200mg  amantadine -rx UTI   11. PAF: Monitor HR tid--eliquis restarted --SDH post fall 07/27/20             --continueTikosyn bid             --appreciate pharmacy help to keep K+>4.0/Mg levels   7/6 rate has been generally controlled 12. Thoracic aortic aneurysm: Followed by [facility]. 14. Hypokalemia: increased repletion to 70meq BID   -3.9 7/5   15. UTI: completed kelfex course  6/26.  -6/30 ucx with 100k pseudomonas--- levaquin for 7 day course  7/3 mental status has improved with rx 16. Abdominal  distention: resolved  -tolerating TF currently  -dc'ed miralax,     Spoke with wife reviewed chart   46.  Insomnia:   -pt was a night owl PTA, typically staying up to 0200 and waking at 1100 each day  -improved somewhat with 100mg  trazodone 18. Seizure activity: 90 second episode that self resolved on 6/24. Neuro consulted, appreciate recs, IV Ativan PRN, Keppra, seizure precautions  -none since keppra initiated  19. Dysphagia:currently D1/honeys by SLP  -mbs 6/29 with moderate oral dysphagia, some pharyngeal component as well  -continue NGT   LOS: 28 days A FACE TO FACE EVALUATION WAS PERFORMED  Janeshia Ciliberto P Lilliana Turner 09/12/2020, 1:32 PM

## 2020-09-13 LAB — GLUCOSE, CAPILLARY
Glucose-Capillary: 114 mg/dL — ABNORMAL HIGH (ref 70–99)
Glucose-Capillary: 114 mg/dL — ABNORMAL HIGH (ref 70–99)
Glucose-Capillary: 144 mg/dL — ABNORMAL HIGH (ref 70–99)
Glucose-Capillary: 145 mg/dL — ABNORMAL HIGH (ref 70–99)
Glucose-Capillary: 146 mg/dL — ABNORMAL HIGH (ref 70–99)
Glucose-Capillary: 158 mg/dL — ABNORMAL HIGH (ref 70–99)
Glucose-Capillary: 175 mg/dL — ABNORMAL HIGH (ref 70–99)

## 2020-09-13 NOTE — Progress Notes (Signed)
PROGRESS NOTE   Subjective/Complaints: Stable, restless Does not appear to be in pain Participated in therapy  ROS: Limited due to cognitive/behavioral   Objective:   No results found. Recent Labs    09/11/20 0402  WBC 10.8*  HGB 11.3*  HCT 35.0*  PLT 189      Recent Labs    09/11/20 0402  NA 141  K 4.0  CL 109  CO2 27  GLUCOSE 109*  BUN 27*  CREATININE 0.85  CALCIUM 8.7*     Intake/Output Summary (Last 24 hours) at 09/13/2020 1921 Last data filed at 09/13/2020 1859 Gross per 24 hour  Intake 460 ml  Output --  Net 460 ml         Physical Exam: Vital Signs Blood pressure 120/90, pulse 88, temperature 98.2 F (36.8 C), resp. rate 16, height 5\' 7"  (1.702 m), weight 86 kg, SpO2 97 %. Gen: no distress, normal appearing HEENT: oral mucosa pink and moist, NCAT Cardio: Reg rate Chest: normal effort, normal rate of breathing Abd: soft, non-distended Ext: no edema Psych: pleasant, normal affect  Skin: No evidence of breakdown, no evidence of rash Musculoskeletal:    Cervical back: no axial or limb pain     Skin:    Comments: bruising Neurological: awoke easily. Makes eye contact. Right gaze preference.  Moves all 4's with preference toward right side.       Assessment/Plan: 1. Functional deficits which require 3+ hours per day of interdisciplinary therapy in a comprehensive inpatient rehab setting. Physiatrist is providing close team supervision and 24 hour management of active medical problems listed below. Physiatrist and rehab team continue to assess barriers to discharge/monitor patient progress toward functional and medical goals  Care Tool:  Bathing    Body parts bathed by patient: Face   Body parts bathed by helper: Buttocks, Left upper leg, Right upper leg, Right lower leg, Left lower leg, Right arm, Left arm, Chest, Abdomen, Front perineal area     Bathing assist Assist Level:  Maximal Assistance - Patient 24 - 49%     Upper Body Dressing/Undressing Upper body dressing   What is the patient wearing?: Pull over shirt    Upper body assist Assist Level: Maximal Assistance - Patient 25 - 49%    Lower Body Dressing/Undressing Lower body dressing      What is the patient wearing?: Pants     Lower body assist Assist for lower body dressing: Total Assistance - Patient < 25%     Toileting Toileting    Toileting assist Assist for toileting: Moderate Assistance - Patient 50 - 74%     Transfers Chair/bed transfer  Transfers assist     Chair/bed transfer assist level: 2 Helpers     Locomotion Ambulation   Ambulation assist   Ambulation activity did not occur: Safety/medical concerns  Assist level: 2 helpers Assistive device: Walker-rolling Max distance: 10   Walk 10 feet activity   Assist  Walk 10 feet activity did not occur: Safety/medical concerns  Assist level: 2 helpers Assistive device: Walker-rolling   Walk 50 feet activity   Assist Walk 50 feet with 2 turns activity did not occur: Safety/medical concerns  Assist level: Maximal Assistance - Patient 25 - 49% Assistive device: Walker-rolling    Walk 150 feet activity   Assist Walk 150 feet activity did not occur: Safety/medical concerns         Walk 10 feet on uneven surface  activity   Assist Walk 10 feet on uneven surfaces activity did not occur: Safety/medical concerns         Wheelchair     Assist     Wheelchair activity did not occur: Safety/medical concerns         Wheelchair 50 feet with 2 turns activity    Assist    Wheelchair 50 feet with 2 turns activity did not occur: Safety/medical concerns       Wheelchair 150 feet activity     Assist  Wheelchair 150 feet activity did not occur: Safety/medical concerns       Blood pressure 120/90, pulse 88, temperature 98.2 F (36.8 C), resp. rate 16, height 5\' 7"  (1.702 m), weight 86  kg, SpO2 97 %.  Medical Problem List and Plan: 1.  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  Impaired mobility -DVT/anticoagulation:  Eliquis restarted 6/21.              -antiplatelet therapy: DC ASA 325 mg/day, confirmed with neurology that it's not necessary given that he's on eliquis 3. Pain Management: N/A 4. Mood: LCSW to follow for evaluation and support.              -antipsychotic agents: N/A 5. Neuropsych: This patient is not capable of making decisions on his own behalf. 6. Skin/Wound Care: Routine pressure relief measures.  7. Fluids/Electrolytes/Nutrition: NPO with tube feeds.             -7/5 increased water flushes to 150 cc every 4 hours given rising BUN -7/8 NA+ 141, BUN/Cr stable to improved  -maintain flushes at 150cc q4 hours  -eating more, will reduce TF to stimulate appetite 8. HTN: Monitor BP tid with SBP goal <140/90.              --continue losartan/day, cardura/day and Metoprolol every 6 hours Vitals:   09/13/20 0414 09/13/20 0900  BP: (!) 119/93 120/90  Pulse: 90 88  Resp: 16   Temp: 98.2 F (36.8 C)   SpO2: 97%   7/10 controlled  9. T2DM: Hgb A1C- 6.2.   CBG (last 3)  Recent Labs    09/13/20 0756 09/13/20 1144 09/13/20 1631  GLUCAP 114* 158* 114*  Controlled 7/10  10. Lethargy: improved -continue 200mg  amantadine -rx UTI   11. PAF: Monitor HR tid--eliquis restarted --SDH post fall 07/27/20             --continue Tikosyn bid             --appreciate pharmacy help to keep K+>4.0/Mg levels   7/10 rate has been generally controlled 12. Thoracic aortic aneurysm: Followed by [facility]. 14. Hypokalemia: increased repletion to 29meq BID   -3.9 7/5   15. UTI: completed kelfex course 6/26.  -6/30 ucx with 100k pseudomonas---  levaquin for 7 day course  7/3 mental status has improved with rx 16. Abdominal distention: resolved  -  tolerating TF currently  -dc'ed miralax,     Spoke with wife reviewed chart   23.  Insomnia:   -pt was a night owl PTA, typically staying up to 0200 and waking at 1100 each day  -improved somewhat with 100mg  trazodone 18. Seizure activity: 90 second episode that self resolved on 6/24. Neuro consulted, appreciate recs, IV Ativan PRN, Keppra, seizure precautions  -none since keppra initiated  19. Dysphagia:currently D1/honeys by SLP  -mbs 6/29 with moderate oral dysphagia, some pharyngeal component as well  -continue NGT   LOS: 29 days A FACE TO FACE EVALUATION WAS PERFORMED  Martha Clan P Briza Bark 09/13/2020, 7:21 PM

## 2020-09-13 NOTE — Progress Notes (Signed)
Occupational Therapy Session Note  Patient Details  Name: [name] [name] MRN: [id number] Date of Birth: [birth year]  Today's Date: 09/13/2020 OT Individual Time: 1607-3710 OT Individual Time Calculation (min): 60 min    Short Term Goals: Week 1:  OT Short Term Goal 1 (Week 1): Pt will attend to ADL task for 1 minute with mod cueing OT Short Term Goal 1 - Progress (Week 1): Progressing toward goal OT Short Term Goal 2 (Week 1): Pt will don shirt with max A OT Short Term Goal 2 - Progress (Week 1): Met OT Short Term Goal 3 (Week 1): Pt will use BUE to bathe UB with mod A OT Short Term Goal 3 - Progress (Week 1): Progressing toward goal  Skilled Therapeutic Interventions/Progress Updates:     Pt received in bed with no indication of pain. Wife present for session.  ADL:  Pt severely externally/tactily distracted per new baseline with less pulling on NG tube but significant perseveration on blowing nose and constant redirection of hands during tasks. Pt requires MAX A for changing shirt and pants. At seated level in TIS and sit to stand level for LB dressing. Pt ambulates into bathroom with +2 A and MOD A intially as bathroom is on L and pt not visually attending to doorway. Pt able ot have BM on toilet and is given wash cloth but throws into toilet. Total A for hygeine. While standing, pt reports needing to pee. Pt remains standing despite redirection to sit to pee and uses 1UE to position himself peeing standing up facing away from toilet initially soiling pants, but then remainder of bladder movement aiming for toilet. Pt unable ot follow cues to sit onto toilet to change pants constantly trying to pull pants up past hips and redirected to sit into chair. Pt combs hair after unsuccessful attempt to locate comb at midline, but combs B sides of head this date. Mobility in hallway with 2 person HHA 2x100 feet with VC for head/hips positioning. Exited session with pt seated in bed, exit alarm on and  call light in reach   Pt left at end of session in bed with exit alarm on, call light in reach and all needs met   Therapy Documentation Precautions:  Precautions Precautions: Fall Precaution Comments: cortrak, confusion, decreased ability to follow instructional commands Restrictions Weight Bearing Restrictions: No General:   Vital Signs: Therapy Vitals Temp: 98.2 F (36.8 C) Pulse Rate: 90 Resp: 16 BP: (!) 119/93 Patient Position (if appropriate): Lying Oxygen Therapy SpO2: 97 % O2 Device: Room Air Pain:   ADL: ADL Eating: NPO Grooming: Dependent Where Assessed-Grooming: Bed level Upper Body Bathing: Dependent Where Assessed-Upper Body Bathing: Edge of bed Lower Body Bathing: Dependent Where Assessed-Lower Body Bathing: Bed level Upper Body Dressing: Dependent Lower Body Dressing: Dependent Where Assessed-Lower Body Dressing: Bed level Toileting: Unable to assess Where Assessed-Toileting: Glass blower/designer: Unable to assess Toilet Transfer Method: Unable to assess Vision   Perception    Praxis   Exercises:   Other Treatments:     Therapy/Group: Individual Therapy  Tonny Branch 09/13/2020, 6:48 AM

## 2020-09-13 NOTE — Progress Notes (Signed)
Physical Therapy Session Note  Patient Details  Name: [name] [name] MRN: [id number] Date of Birth: 15-Aug-1940  Today's Date: 09/13/2020 PT Individual Time: 1405-1510 PT Individual Time Calculation (min): 65 min   Short Term Goals: Week 4:  PT Short Term Goal 1 (Week 4): Patient will perform bed mobility with mod-min A of 1 person consistently. PT Short Term Goal 2 (Week 4): Patient will ambulate >100 feet with mod A >75% of the time and +2 w/c follow for safety  using LRAD. PT Short Term Goal 3 (Week 4): Patient will demonstrate sustained attention to a task in standing >4 min with mod cues for attention and min A for standing balance. PT Short Term Goal 4 (Week 4): Patient will perform basic transfers with mod-min A of 1 person with min verbal cues.  Skilled Therapeutic Interventions/Progress Updates:     Patient in bed with his wife at bedside upon PT arrival. Patient alert and agreeable to PT session. Patient denied pain during session and did not display any outward signs of pain throughout session.  Therapeutic Activity: Bed Mobility: Patient performed supine to/from sit with min A +2 due to slow initiation. Provided max multimodal cues for initiation and min cues for sequencing. Transfers: Patient performed sit to/from stand from the bed x1, w/c x2, and toilet x2 with min A +2 with B HHA. Provided mod multimodal cues for initiation and forward weight shift to stand. Patient was continent of bowl during toileting. Patient required max cues for verbalizing her needed to go to the bathroom, as he was pulling at his pants, as if to unbutton them. He performed peri-care with min A for limiting soiling his fingers or the therapist with the wash cloths and total a for thoroughness x2, as patient performed peri-care and donned incontinence brief and shorts x1 then removed his shorts and returned to sitting to continue BM. Lower body clothing management was performed with max-total A with patient  automatically reaching to pull up his pants with his R hand and L hand undershooting to grab his pants and pulling down his pants on the R.  Gait Training:  Patient ambulated 338 feet and 16 feet using B HHA with min-mod a +2 and patient's wife provided w/c follow for safety. Ambulated with flexed posture, reciprocal gait pattern progressing to shuffling gait with fatigue, decreased B step height, length, and gait speed. Provided verbal cues for increased gait speed, provided navigation cues well in advance with increased patient attention and command following this session.  Neuromuscular Re-ed: Patient performed the following dynamic sitting balance activities during functional tasks: -donning shorts sitting EOB required supervision-min a for sitting balance, performed figure fore sitting, reaching forward to the floor, and incorporated his L hand in the task with hand-over-hand facilitation due to tendency to undershoot with his L hand, patient also self-initiated doffing/donning socks during task. Required mod/max A and >8 min to complete task due to apraxia and motor planning deficits, patient demonstrated sustained attention with mod cues throughout  Patient in bed with B wrist restraints and hand mitts secures and his wife at bedside at end of session with breaks locked, bed alarm set, and all needs within reach.   Therapy Documentation Precautions:  Precautions Precautions: Fall Precaution Comments: cortrak, confusion, decreased ability to follow instructional commands Restrictions Weight Bearing Restrictions: No    Therapy/Group: Individual Therapy  Zira Helinski L Laurier Jasperson PT, DPT  09/13/2020, 5:16 PM

## 2020-09-13 NOTE — Progress Notes (Signed)
Speech Language Pathology Daily Session Note  Patient Details  Name: [name] [name] MRN: [id number] Date of Birth: [birth year]  Today's Date: 09/13/2020 SLP Individual Time: 1300-1345 SLP Individual Time Calculation (min): 45 min  Short Term Goals: Week 4: SLP Short Term Goal 1 (Week 4): STGs=LTGs due to ELOS  Skilled Therapeutic Interventions: Pt seen for skilled ST with focus on dysphagia goals, wife present throughout. Pt seen for lunch meal of Dys 1/HTL, readjusted in bed with HOB raised for safety. Pt initially requiring cues to increase alertness for safe PO intake, once awake requiring max fading to min A cues to utilize safe swallow strategies. Pt initially quite impulsive, attempting large bites and rapid intake requiring max verbal cues and hand over hand assist to moderate. Pt with 3 episodes immediate cough following HTL sips, likely due to stasis in oral and pharyngeal space. Pt becoming less restless and impulsive as meal progressed, able to feed himself safely with min-mod A cues. Pt continues with language of confusion and low vocal intensity throughout tx session, intelligibility 25-50% with unknown topics. SLP spoke with RN about pt eyes appearing red, watery and swollen. Pt left in bed with alarm set and wife present, NT to finish meal supervision. Cont ST POC.   Pain Pain Assessment Pain Scale: 0-10 Pain Score: 0-No pain  Therapy/Group: Individual Therapy  Dewaine Conger 09/13/2020, 1:47 PM

## 2020-09-14 LAB — GLUCOSE, CAPILLARY
Glucose-Capillary: 156 mg/dL — ABNORMAL HIGH (ref 70–99)
Glucose-Capillary: 107 mg/dL — ABNORMAL HIGH (ref 70–99)
Glucose-Capillary: 141 mg/dL — ABNORMAL HIGH (ref 70–99)
Glucose-Capillary: 160 mg/dL — ABNORMAL HIGH (ref 70–99)
Glucose-Capillary: 106 mg/dL — ABNORMAL HIGH (ref 70–99)

## 2020-09-14 MED ORDER — POLYVINYL ALCOHOL 1.4 % OP SOLN
1.0000 [drp] | Freq: Two times a day (BID) | OPHTHALMIC | Status: DC
  Administered 2020-09-14 – 2020-09-17 (×7): 1 [drp] via OPHTHALMIC
  Filled 2020-09-14: qty 15

## 2020-09-14 NOTE — Progress Notes (Addendum)
Patient ID: [name] [name], male   DOB: 05-Jul-1940, 80 y.o.   MRN: [id number]  Per medical team, pt will not d/c today.   SW left message for Donna/Director for Social Services with [facility] [place] [phone]) to inform pt will not d/c today, and will f/u tomorrow after team conference once there is more information to confirm pt plan of care and d/c date. SW reiterated would like to utilize SNF option.  *SW received message from [facility] reporting they are ready to accept pt into SNF. States they will be meeting with pt wife and son on Wednesday afternoon to discuss further.   Loralee Pacas, MSW, [place] Office: [phone] Cell: [phone] Fax: [phone]

## 2020-09-14 NOTE — Progress Notes (Signed)
PROGRESS NOTE   Subjective/Complaints: Lying in bed. No new complaints. Sugars still out of control. Eating snacks/sweet treats despite our conversation last week  Objective:   No results found. No results for input(s): WBC, HGB, HCT, PLT in the last 72 hours.     No results for input(s): NA, K, CL, CO2, GLUCOSE, BUN, CREATININE, CALCIUM in the last 72 hours.    Intake/Output Summary (Last 24 hours) at 09/14/2020 0855 Last data filed at 09/13/2020 1859 Gross per 24 hour  Intake 240 ml  Output --  Net 240 ml         Physical Exam: Vital Signs Blood pressure 130/71, pulse 81, temperature 98.1 F (36.7 C), temperature source Oral, resp. rate 17, height 5\' 7"  (1.702 m), weight 86 kg, SpO2 99 %. Constitutional: No distress . Vital signs reviewed. HEENT: EOMI, oral membranes moist, NGT Neck: supple Cardiovascular: RRR without murmur. No JVD    Respiratory/Chest: CTA Bilaterally without wheezes or rales. Normal effort    GI/Abdomen: BS +, non-tender, non-distended Ext: no clubbing, cyanosis, or edema Psych: pleasant and cooperative  Skin: No evidence of breakdown, no evidence of rash, some bruising Musculoskeletal:    Cervical back: no axial or limb pain Neurological: awoke easily. Makes eye contact. Right gaze preference.  Moves all 4's with preference toward right side.       Assessment/Plan: 1. Functional deficits which require 3+ hours per day of interdisciplinary therapy in a comprehensive inpatient rehab setting. Physiatrist is providing close team supervision and 24 hour management of active medical problems listed below. Physiatrist and rehab team continue to assess barriers to discharge/monitor patient progress toward functional and medical goals  Care Tool:  Bathing    Body parts bathed by patient: Face   Body parts bathed by helper: Buttocks, Left upper leg, Right upper leg, Right lower leg, Left  lower leg, Right arm, Left arm, Chest, Abdomen, Front perineal area     Bathing assist Assist Level: Maximal Assistance - Patient 24 - 49%     Upper Body Dressing/Undressing Upper body dressing   What is the patient wearing?: Pull over shirt    Upper body assist Assist Level: Maximal Assistance - Patient 25 - 49%    Lower Body Dressing/Undressing Lower body dressing      What is the patient wearing?: Pants     Lower body assist Assist for lower body dressing: Total Assistance - Patient < 25%     Toileting Toileting    Toileting assist Assist for toileting: Moderate Assistance - Patient 50 - 74%     Transfers Chair/bed transfer  Transfers assist     Chair/bed transfer assist level: 2 Helpers     Locomotion Ambulation   Ambulation assist   Ambulation activity did not occur: Safety/medical concerns  Assist level: 2 helpers Assistive device: Walker-rolling Max distance: 10   Walk 10 feet activity   Assist  Walk 10 feet activity did not occur: Safety/medical concerns  Assist level: 2 helpers Assistive device: Walker-rolling   Walk 50 feet activity   Assist Walk 50 feet with 2 turns activity did not occur: Safety/medical concerns  Assist level: Maximal Assistance - Patient 25 -  49% Assistive device: Walker-rolling    Walk 150 feet activity   Assist Walk 150 feet activity did not occur: Safety/medical concerns         Walk 10 feet on uneven surface  activity   Assist Walk 10 feet on uneven surfaces activity did not occur: Safety/medical Armed forces technical officer activity did not occur: Safety/medical concerns         Wheelchair 50 feet with 2 turns activity    Assist    Wheelchair 50 feet with 2 turns activity did not occur: Safety/medical concerns       Wheelchair 150 feet activity     Assist  Wheelchair 150 feet activity did not occur: Safety/medical concerns       Blood  pressure 130/71, pulse 81, temperature 98.1 F (36.7 C), temperature source Oral, resp. rate 17, height 5\' 7"  (1.702 m), weight 86 kg, SpO2 99 %.  Medical Problem List and Plan: 1.  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  Impaired mobility -DVT/anticoagulation:  Eliquis restarted 6/21.              -antiplatelet therapy: Stopped ASA 325 mg/day, confirmed with neurology that it's not necessary given that he's on eliquis 3. Pain Management: N/A 4. Mood: LCSW to follow for evaluation and support.              -antipsychotic agents: N/A 5. Neuropsych: This patient is not capable of making decisions on his own behalf. 6. Skin/Wound Care: Routine pressure relief measures.  7. Fluids/Electrolytes/Nutrition: eating 50-100%, DC NGT  -may need IVF at night--push fluids for now  -check labs in AM 8. HTN: Monitor BP tid with SBP goal <140/90.              --continue losartan/day, cardura/day and Metoprolol every 6 hours Vitals:   09/13/20 2002 09/14/20 0403  BP: (!) 110/55 130/71  Pulse: 86 81  Resp: 18 17  Temp: 98.2 F (36.8 C) 98.1 F (36.7 C)  SpO2: 96% 99%  7/11 controlled  9. T2DM: Hgb A1C- 6.2.   CBG (last 3)  Recent Labs    09/13/20 2356 09/14/20 0404 09/14/20 0813  GLUCAP 146* 156* 107*  Controlled 7/11  10. Lethargy: improved -continue 200mg  amantadine -rx'ed UTI   11. PAF: Monitor HR tid--eliquis restarted --SDH post fall 07/27/20             --continue Tikosyn bid             --appreciate pharmacy help to keep K+>4.0/Mg levels   7/11 rate has been generally controlled 12. Thoracic aortic aneurysm: Followed by [facility]. 14. Hypokalemia: increased repletion to 71meq BID   -3.9 7/5   15. UTI: completed kelfex  course 6/26.  -6/30 ucx with 100k pseudomonas--- levaquin for 7 day course  7/3 mental status has improved with rx 16. Abdominal distention: resolved  -tolerating TF currently  -dc'ed miralax,     Spoke with wife reviewed chart   17.  Insomnia:   -pt was a night owl PTA, typically  staying up to 0200 and waking at 1100 each day  -has generally improved with 100mg  trazodone 18. Seizure activity: 90 second episode that self resolved on 6/24. Neuro consulted, appreciate recs, IV Ativan PRN, Keppra, seizure precautions  -none since keppra initiated  19. Dysphagia:currently D1/honeys by SLP     LOS: 30 days A FACE TO [place] 09/14/2020, 8:55 AM

## 2020-09-14 NOTE — Progress Notes (Signed)
Occupational Therapy Session Note  Patient Details  Name: [name] [name] MRN: [id number] Date of Birth: [birth year]  Today's Date: 09/14/2020 OT Individual Time: 7903-8333 OT Individual Time Calculation (min): 56 min    Short Term Goals: Week 4:  OT Short Term Goal 1 (Week 4): STG= LTG d/t ELOS  Skilled Therapeutic Interventions/Progress Updates:     Pt received in  bed agreeable to OT with wife present. No pain reported. Less perseveration on NG tube this date, however still frequently wanting to blow his nose  ADL:  Total A for bed level LB dressing d/t no second set of hands   Therapeutic exercise PROM provided to trunk via knees to chest and supine twist PROM of cervical spine to improve ROM from L neglect: cervical rotation and lateral flexion 3x1 min per movement   Therapeutic activity Functional mobility in hallway 1x254 feet with MIN A and increased time to process commands/initiate as well as maintain attention to moving forward as pt very externally distracted and second bout 1x192'  Pt left at end of session in bed with exit alarm on, call light in reach and all needs met   Therapy Documentation Precautions:  Precautions Precautions: Fall Precaution Comments: cortrak, confusion, decreased ability to follow instructional commands Restrictions Weight Bearing Restrictions: No General:   Vital Signs: Therapy Vitals Temp: 98.1 F (36.7 C) Temp Source: Oral Pulse Rate: 81 Resp: 17 BP: 130/71 Patient Position (if appropriate): Lying Oxygen Therapy SpO2: 99 % O2 Device: Room Air Pain:   ADL: ADL Eating: NPO Grooming: Dependent Where Assessed-Grooming: Bed level Upper Body Bathing: Dependent Where Assessed-Upper Body Bathing: Edge of bed Lower Body Bathing: Dependent Where Assessed-Lower Body Bathing: Bed level Upper Body Dressing: Dependent Lower Body Dressing: Dependent Where Assessed-Lower Body Dressing: Bed level Toileting: Unable to  assess Where Assessed-Toileting: Glass blower/designer: Unable to assess Toilet Transfer Method: Unable to assess Vision   Perception    Praxis   Exercises:   Other Treatments:     Therapy/Group: Individual Therapy  Tonny Branch 09/14/2020, 6:43 AM

## 2020-09-14 NOTE — Progress Notes (Signed)
Physical Therapy Session Note  Patient Details  Name: [name] [name] MRN: [id number] Date of Birth: May 01, 1940  Today's Date: 09/14/2020 PT Individual Time: 1405-1505 PT Individual Time Calculation (min): 60 min   Short Term Goals: Week 4:  PT Short Term Goal 1 (Week 4): Patient will perform bed mobility with mod-min A of 1 person consistently. PT Short Term Goal 2 (Week 4): Patient will ambulate >100 feet with mod A >75% of the time and +2 w/c follow for safety  using LRAD. PT Short Term Goal 3 (Week 4): Patient will demonstrate sustained attention to a task in standing >4 min with mod cues for attention and min A for standing balance. PT Short Term Goal 4 (Week 4): Patient will perform basic transfers with mod-min A of 1 person with min verbal cues.  Skilled Therapeutic Interventions/Progress Updates:     Patient in bed with his wife at bedside upon PT arrival. Patient intermittently alert, closing his eyes when in supine, and agreeable to PT session. Patient stated neck pain/stiffness during session, RN made aware. PT provided repositioning, rest breaks, and distraction as pain interventions throughout session.   Per patient's wife, all therapy sessions have gone very well today. Educated on increased energy expenditure with eating meals and attending during therapy sessions leading to afternoon fatigue and decreased performance with motor planning throughout session. Patient demonstrated good self-initiation with functional tasks once upright throughout session.   Patient found to be incontinent of bowl and bladder at beginning of session.   Therapeutic Activity: Bed Mobility: Patient performed rolling R/L with total A +2 due to lack of initiation from patient due to decreased arousal. Performed peri-care and lower body clothing management with total A. Performed supine to/from sit with max A +2 due to poor motor planning and initiation with increased fatigue/drowsiness this afternoon.  Provided max cues for initiation and sequencing. Patient initially required max A +2 for sitting balance due to strong posterior lean, recovered to supervision-CGA with cues to put hands on knees to reduce retropulsion.  Transfers: Patient performed sit to/from stand from the bed x3 and from the toilet x2 with min-mod A with RW, B HHA, or grab bar. Provided verbal cues for initiation, forward weight shift, and trunk extension in standing. Patient initiated ambulating with RW, once at the room door, patient began grabbing his shorts to undo his button. Asked if he needed to void and patient said yes, provided patient with the urinal with +2 assist while in standing and performed lower body clothing management with max A with patient using his R hand to hold his shorts. Patient was unsuccessful with voiding and then reported that he needed to have a BM. Patient ambulated with RW to the bathroom requiring increased time and max cuing to navigate with RW, adjusted to B HHA with improved navigation and ability to facilitate changes in direction due to poor motor planning this session. Patient was incontinent and continent of bowl during toileting. Performed peri-care with set-up assist x2 and total A for thoroughness and max A for lower body clothing management and increased time due to patient grabbing his brief and attempting to tear it like the toilet paper. Patient difficult to redirect from this and required max hand over hand assist to redirect his hand to the grab bar. Patient self selected to turn to flush the toilet, however, was unable to communicate this to the therapist, when asked what he was doing.  Patient ambulated to the sink with mod-max A +  2. Patient initiated washing his hands, however, became distracted by the urinal on the sing and grabbed the urinal to fill it with water. PT intercepted this and was able to redirect the patient to washing his hands. Patient attempted to fill his hands with water  to bring them to his mouth x2, PT again intercepted his hands to prevent patient from drinking thin liquids, as he is only cleared for honey thick liquids at this time. Patient followed cues to wipe down the sink with paper towels and ambulated back to the bed, as above. Patient performed standing tasks with min A-CGA for standing balance today.   Performed soft tissue mobilization and cervical rotation stretching at end of session for improved ROM and reduced neck pain.   Patient in bed asleep with B soft wrist restraints secured and his wife in the room at end of session with breaks locked, bed alarm set, and all needs within reach.   Therapy Documentation Precautions:  Precautions Precautions: Fall Precaution Comments: cortrak, confusion, decreased ability to follow instructional commands Restrictions Weight Bearing Restrictions: No    Therapy/Group: Individual Therapy  Sally Reimers L Jacorion Klem PT, DPT  09/14/2020, 4:54 PM

## 2020-09-14 NOTE — Progress Notes (Signed)
Nutrition Follow-up  DOCUMENTATION CODES:   Not applicable  INTERVENTION:  Provide Magic cup TID with meals, each supplement provides 290 kcal and 9 grams of protein  Encourage adequate PO intake.   NUTRITION DIAGNOSIS:   Inadequate oral intake related to inability to eat as evidenced by NPO status; diet advanced; improved  GOAL:   Patient will meet greater than or equal to 90% of their needs; progressing  MONITOR:   PO intake, Supplement acceptance, Diet advancement, Skin, Weight trends, Labs, I & O's  REASON FOR ASSESSMENT:   New TF    ASSESSMENT:   80 yo male with a PMH of diastolic CHF, AVR/MVR, A-fib on coumadin/Tiksoyn who was admitted on 5/23 for difficulty speaking and R sided weakness after a fall the day before. 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 He was intubated for airway protection and family elected on DNR with comfort care due to poor prognosis.  However, patient started showing ability to follow commands on 5/24 with repeat CT showing persistent bilateral SDH.  He was taken to the OR for right craniotomy for evacuation of SDH. He tolerated extubation without difficulty but was limited by left greater than right-sided weakness with visual deficits, strong posterior bias as well as poor safety awareness with cognitive deficits.  He was admitted to rehab on 5/27 for intensive rehab program to consist of PT OT and speech therapy.  Meal completion has been 50-100% with 85% at breakfast this morning. PO intake has improved. Wife at bedside continues to encouraged pt PO intake. Plans to discontinue Cortrak NGT today. Tube feeding orders have been discontinued. Magic cup with meals to aid in caloric and protein needs.   Labs and medications reviewed.   Diet Order:   Diet Order             DIET - DYS 1 Room service appropriate? Yes; Fluid consistency: Honey Thick  Diet effective 1000                   EDUCATION NEEDS:    Education needs have been addressed  Skin:  Skin Assessment: Reviewed RN Assessment  Last BM:  7/10  Height:   Ht Readings from Last 1 Encounters:  08/17/20 5\' 7"  (1.702 m)    Weight:   Wt Readings from Last 1 Encounters:  08/20/20 86 kg    BMI:  Body mass index is 29.69 kg/m.  Estimated Nutritional Needs:   Kcal:  2000-2200  Protein:  115-130 grams  Fluid:  >2 L  Corrin Parker, MS, RD, LDN RD pager number/after hours weekend pager number on Amion.

## 2020-09-14 NOTE — Progress Notes (Signed)
Speech Language Pathology Daily Session Note  Patient Details  Name: [name] [name] MRN: [id number] Date of Birth: Sep 13, 1940  Today's Date: 09/14/2020  Session 1: SLP Individual Time: 0815-0900 SLP Individual Time Calculation (min): 45 min  Session 2: SLP Individual Time: 0254-2706 SLP Individual Time Calculation (min): 30 min  Short Term Goals: Week 4: SLP Short Term Goal 1 (Week 4): STGs=LTGs due to ELOS  Skilled Therapeutic Interventions:   Session 1: Skilled treatment session focused on dysphagia and cognitive goals. Upon arrival, patient was incontinent of bowel. Patient followed commands during peri care with extra time and Mod verbal and tactile cues. Throughout session, patient was more alert with decreased restlessness compared to previous session but overall Mod A verbal, visual and tactile cues were needed for initiation and attention to self-feeding. Patient consumed breakfast meal of Dys. 1 textures with honey-thick liquids without overt s/s of aspiration with Min verbal cues needed for small bites/sips and a slow rate of self-feeding. Recommend patient continue current diet. Patient with increased vocal intensity and increased initiation of verbal expression with intermittent language of confusion noted. Patient left upright in bed with RN and wife present. Continue with current plan of care.   Session 2: Skilled treatment session focused on dysphagia and cognitive goals. SLP facilitated session by providing Min- Mod A verbal, tactile and visual cues for initiation and attention to self-feeding. Patient consumed meal of Dys. 1 textures with honey-thick liquids without overt s/s of aspiration with extra time and Min-Mod A multimodal cues for use of small bites/sips. Recommend patient continue current diet. Patient left upright in bed with alarm on and NT present. Continue with current plan of care.      Pain  Session 1: Pain Assessment Pain Scale: Faces Faces Pain Scale: No  hurt  Session 2:  No indications of pain   Therapy/Group: Individual Therapy  [name] 09/14/2020, 12:36 PM

## 2020-09-15 LAB — GLUCOSE, CAPILLARY
Glucose-Capillary: 101 mg/dL — ABNORMAL HIGH (ref 70–99)
Glucose-Capillary: 104 mg/dL — ABNORMAL HIGH (ref 70–99)
Glucose-Capillary: 105 mg/dL — ABNORMAL HIGH (ref 70–99)
Glucose-Capillary: 134 mg/dL — ABNORMAL HIGH (ref 70–99)
Glucose-Capillary: 150 mg/dL — ABNORMAL HIGH (ref 70–99)
Glucose-Capillary: 168 mg/dL — ABNORMAL HIGH (ref 70–99)

## 2020-09-15 LAB — CBC
HCT: 36.3 % — ABNORMAL LOW (ref 39.0–52.0)
Hemoglobin: 11.8 g/dL — ABNORMAL LOW (ref 13.0–17.0)
MCH: 30.6 pg (ref 26.0–34.0)
MCHC: 32.5 g/dL (ref 30.0–36.0)
MCV: 94.3 fL (ref 80.0–100.0)
Platelets: 188 10*3/uL (ref 150–400)
RBC: 3.85 MIL/uL — ABNORMAL LOW (ref 4.22–5.81)
RDW: 14.8 % (ref 11.5–15.5)
WBC: 11.7 10*3/uL — ABNORMAL HIGH (ref 4.0–10.5)
nRBC: 0 % (ref 0.0–0.2)

## 2020-09-15 LAB — BASIC METABOLIC PANEL
Anion gap: 6 (ref 5–15)
BUN: 20 mg/dL (ref 8–23)
CO2: 27 mmol/L (ref 22–32)
Calcium: 9.1 mg/dL (ref 8.9–10.3)
Chloride: 107 mmol/L (ref 98–111)
Creatinine, Ser: 0.79 mg/dL (ref 0.61–1.24)
GFR, Estimated: >60 mL/min (ref >60)
Glucose, Bld: 107 mg/dL — ABNORMAL HIGH (ref 70–99)
Potassium: 3.9 mmol/L (ref 3.5–5.1)
Sodium: 140 mmol/L (ref 135–145)

## 2020-09-15 LAB — MAGNESIUM: Magnesium: 2 mg/dL (ref 1.7–2.4)

## 2020-09-15 MED ORDER — SENNOSIDES-DOCUSATE SODIUM 8.6-50 MG PO TABS: 1.0000 | ORAL_TABLET | Freq: Every evening | ORAL | Status: DC | PRN

## 2020-09-15 MED ORDER — POTASSIUM CHLORIDE 20 MEQ PO PACK: 40.0000 meq | PACK | Freq: Two times a day (BID) | ORAL | Status: DC

## 2020-09-15 MED ORDER — POLYVINYL ALCOHOL 1.4 % OP SOLN: 1.0000 [drp] | Freq: Two times a day (BID) | OPHTHALMIC | 0 refills | Status: DC

## 2020-09-15 MED ORDER — TRAZODONE HCL 50 MG PO TABS
100.0000 mg | ORAL_TABLET | Freq: Every day | ORAL | Status: DC
  Administered 2020-09-15 – 2020-09-16 (×2): 100 mg via ORAL
  Filled 2020-09-15 (×2): qty 2

## 2020-09-15 MED ORDER — ATORVASTATIN CALCIUM 80 MG PO TABS
80.0000 mg | ORAL_TABLET | Freq: Every evening | ORAL | Status: DC
  Administered 2020-09-15 – 2020-09-16 (×2): 80 mg via ORAL
  Filled 2020-09-15 (×2): qty 1

## 2020-09-15 MED ORDER — MAGNESIUM GLUCONATE 500 MG PO TABS
500.0000 mg | ORAL_TABLET | Freq: Every day | ORAL | Status: DC
  Administered 2020-09-16 – 2020-09-17 (×2): 500 mg via ORAL
  Filled 2020-09-15 (×2): qty 1

## 2020-09-15 MED ORDER — LOSARTAN POTASSIUM 25 MG PO TABS
25.0000 mg | ORAL_TABLET | Freq: Every day | ORAL | Status: DC
  Administered 2020-09-16 – 2020-09-17 (×2): 25 mg via ORAL
  Filled 2020-09-15 (×2): qty 1

## 2020-09-15 MED ORDER — POTASSIUM CHLORIDE 20 MEQ PO PACK: 40.0000 meq | PACK | Freq: Once | ORAL | Status: DC

## 2020-09-15 MED ORDER — MAGNESIUM SULFATE 2 GM/50ML IV SOLN
2.0000 g | Freq: Once | INTRAVENOUS | Status: AC
  Administered 2020-09-15: 2 g via INTRAVENOUS
  Filled 2020-09-15: qty 50

## 2020-09-15 MED ORDER — ALUM & MAG HYDROXIDE-SIMETH 200-200-20 MG/5ML PO SUSP: 30.0000 mL | ORAL | Status: DC | PRN

## 2020-09-15 MED ORDER — AMANTADINE HCL 100 MG PO CAPS
100.0000 mg | ORAL_CAPSULE | Freq: Two times a day (BID) | ORAL | Status: DC
  Administered 2020-09-15 – 2020-09-16 (×2): 100 mg via ORAL
  Filled 2020-09-15 (×2): qty 1

## 2020-09-15 MED ORDER — MAGNESIUM GLUCONATE 500 MG PO TABS: 500.0000 mg | ORAL_TABLET | Freq: Every day | ORAL | Status: DC

## 2020-09-15 MED ORDER — PANTOPRAZOLE SODIUM 40 MG PO PACK
40.0000 mg | PACK | Freq: Every day | ORAL | Status: DC
  Administered 2020-09-16 – 2020-09-17 (×2): 40 mg via ORAL
  Filled 2020-09-15 (×2): qty 20

## 2020-09-15 MED ORDER — APIXABAN 5 MG PO TABS
5.0000 mg | ORAL_TABLET | Freq: Two times a day (BID) | ORAL | Status: DC
  Administered 2020-09-15 – 2020-09-17 (×4): 5 mg via ORAL
  Filled 2020-09-15 (×4): qty 1

## 2020-09-15 MED ORDER — ATORVASTATIN CALCIUM 80 MG PO TABS: 80.0000 mg | ORAL_TABLET | Freq: Every day | ORAL | Status: DC

## 2020-09-15 MED ORDER — LEVETIRACETAM 500 MG PO TABS: 500.0000 mg | ORAL_TABLET | Freq: Two times a day (BID) | ORAL | Status: DC

## 2020-09-15 MED ORDER — GUAIFENESIN-DM 100-10 MG/5ML PO SYRP: 5.0000 mL | ORAL_SOLUTION | Freq: Four times a day (QID) | ORAL | Status: DC | PRN

## 2020-09-15 MED ORDER — DIPHENHYDRAMINE HCL 12.5 MG/5ML PO ELIX: 12.5000 mg | ORAL_SOLUTION | Freq: Four times a day (QID) | ORAL | Status: DC | PRN

## 2020-09-15 MED ORDER — LEVETIRACETAM 500 MG PO TABS
500.0000 mg | ORAL_TABLET | Freq: Two times a day (BID) | ORAL | Status: DC
  Administered 2020-09-15 – 2020-09-17 (×4): 500 mg via ORAL
  Filled 2020-09-15 (×4): qty 1

## 2020-09-15 MED ORDER — DOXAZOSIN MESYLATE 2 MG PO TABS
2.0000 mg | ORAL_TABLET | Freq: Every day | ORAL | Status: DC
  Administered 2020-09-16 – 2020-09-17 (×2): 2 mg via ORAL
  Filled 2020-09-15 (×2): qty 1

## 2020-09-15 MED ORDER — INSULIN ASPART 100 UNIT/ML IJ SOLN
0.0000 [IU] | Freq: Three times a day (TID) | INTRAMUSCULAR | Status: DC
  Administered 2020-09-15: 2 [IU] via SUBCUTANEOUS
  Administered 2020-09-15 – 2020-09-16 (×2): 1 [IU] via SUBCUTANEOUS
  Administered 2020-09-16 – 2020-09-17 (×2): 2 [IU] via SUBCUTANEOUS

## 2020-09-15 MED ORDER — POTASSIUM CHLORIDE 20 MEQ PO PACK
40.0000 meq | PACK | Freq: Two times a day (BID) | ORAL | Status: DC
  Administered 2020-09-15 – 2020-09-17 (×4): 40 meq via ORAL
  Filled 2020-09-15 (×4): qty 2

## 2020-09-15 MED ORDER — METOPROLOL TARTRATE 25 MG PO TABS: 25.0000 mg | ORAL_TABLET | Freq: Two times a day (BID) | ORAL | Status: DC

## 2020-09-15 MED ORDER — ACETAMINOPHEN 325 MG PO TABS: 325.0000 mg | ORAL_TABLET | ORAL | Status: DC | PRN

## 2020-09-15 MED ORDER — TRAZODONE HCL 100 MG PO TABS: 100.0000 mg | ORAL_TABLET | Freq: Every day | ORAL | Status: DC

## 2020-09-15 MED ORDER — POTASSIUM CHLORIDE 20 MEQ PO PACK
40.0000 meq | PACK | Freq: Once | ORAL | Status: AC
  Administered 2020-09-15: 40 meq via ORAL
  Filled 2020-09-15: qty 2

## 2020-09-15 MED ORDER — METOPROLOL TARTRATE 25 MG PO TABS
25.0000 mg | ORAL_TABLET | Freq: Two times a day (BID) | ORAL | Status: DC
  Administered 2020-09-15 – 2020-09-17 (×4): 25 mg via ORAL
  Filled 2020-09-15 (×4): qty 1

## 2020-09-15 MED ORDER — DOFETILIDE 500 MCG PO CAPS
500.0000 ug | ORAL_CAPSULE | Freq: Two times a day (BID) | ORAL | Status: DC
  Administered 2020-09-15 – 2020-09-17 (×3): 500 ug via ORAL
  Filled 2020-09-15 (×2): qty 1
  Filled 2020-09-15: qty 2
  Filled 2020-09-15 (×2): qty 1
  Filled 2020-09-15: qty 2

## 2020-09-15 NOTE — Progress Notes (Signed)
Pharmacy: Dofetilide (Tikosyn) - Follow Up Assessment and Electrolyte Replacement  Pharmacy consulted to assist in monitoring and replacing electrolytes in this 80 y.o. male admitted on 08/15/2020 on dofetilide.   Labs:    Component Value Date/Time   K 3.9 09/15/2020 0432   K 4.1 11/26/2013 0000   MG 2.0 09/15/2020 0432     Plan: Potassium: K 3.8-3.9:  Give KCl 40 mEq per tube x1   Magnesium: Mg 1.8-2: Give Mg 2 gm IV x1     Thank you for allowing pharmacy to participate in this patient's care   [employer], Pharm.D, BCPS 09/15/2020 9:07 AM  Please check [email or web address] for unit-specific pharmacy phone numbers.

## 2020-09-15 NOTE — Progress Notes (Signed)
Occupational Therapy Session Note  Patient Details  Name: BINGHAM MILLETTE MRN: [id number] Date of Birth: 1940/09/03  Today's Date: 09/15/2020 OT Individual Time: 1035-1130 OT Individual Time Calculation (min): 55 min    Short Term Goals: Week 4:  OT Short Term Goal 1 (Week 4): STG= LTG d/t ELOS   Skilled Therapeutic Interventions/Progress Updates:    Pt received supine with his wife present. He was alert and had higher vocal volume than normal. Pt was more lethargic and fatigued this session and required extra time for rest breaks supine and for increased processing time. Session focused on EOB sitting balance, command following, ADL sequencing, and initiation. Pt required max A for supine > sit and then mod A initially EOB, reducing to min/CGA. Pt required mod multimodal cueing for initiation of all ADLs, with poor motor planning and sequencing through UB dressing/bathing and oral care. Attempted several times to initiate OOB mobility and pt was unable to sequence standing without total A and was ultimately unwilling. He transitioned back to supine with supervision, lifting his BLE. He was left supine, posey belt on per pt's wife request. Bed alarm set.   Therapy Documentation Precautions:  Precautions Precautions: Fall Precaution Comments: cortrak, confusion, decreased ability to follow instructional commands Restrictions Weight Bearing Restrictions: No   Therapy/Group: Individual Therapy  Curtis Sites 09/15/2020, 6:09 AM

## 2020-09-15 NOTE — Progress Notes (Signed)
Occupational Therapy Session Note  Patient Details  Name: TYLIN FORCE MRN: [id number] Date of Birth: 01/31/1941  Today's Date: 09/15/2020 OT Individual Time: 1330-1400 OT Individual Time Calculation (min): 30 min  and Today's Date: 09/15/2020 OT Co-Treatment Time: 1300-1330 OT Co-Treatment Time Calculation (min): 30 min   Short Term Goals: Week 4:  OT Short Term Goal 1 (Week 4): STG= LTG d/t ELOS Week 5:     Skilled Therapeutic Interventions/Progress Updates:    Patient in bed, alert but slow to respond, wife present for therapy session.  Co-treat with PT for first half hour with focus on OOB, SPT to w/c and functional mobility.  Patient initially resistant to sitting edge of bed, donning shorts and standing but slow calm directions with limited distractions and gentle direction of 2 - able to stand and turn to w/c.  Completed w/c mobility and ambulation with min A of 2 - patient able to focus on and read sign in hallway.  Utilized BITS system in stance to complete reaching and direction following with patient able to hit targets on cue with min delay.   Attempted to stand for second BITS activity but patient indicated that he needed to use the bathroom.  To bathroom via w/c - completed SPT mod A of one, max A for pants down, patient able to complete hygiene with CGA and cues, dependent to place clean brief and complete CM in stance.  Min A for hand hygiene.  SPT w/c to bed with max A.   Sit to supine max A.  Bed posey belt alarm set, bed alarm set.  Set up with lunch tray - he was able to complete scooping and fork to mouth with CS and occ cues for bite size and rate - wife assisting at close of session.    Therapy Documentation Precautions:  Precautions Precautions: Fall Precaution Comments: cortrak, confusion, decreased ability to follow instructional commands Restrictions Weight Bearing Restrictions: No   Therapy/Group: Individual Therapy  Carlos Levering 09/15/2020, 7:36 AM

## 2020-09-15 NOTE — Patient Care Conference (Signed)
Inpatient RehabilitationTeam Conference and Plan of Care Update Date: 09/15/2020   Time: 10:03 AM    Patient Name: [name] [name]      Medical Record Number: [id number]  Date of Birth: 1940/05/15 Sex: Male         Room/Bed: 4M01C/4M01C-01 Payor Info: Payor: MEDICARE / Plan: MEDICARE PART A AND B / Product Type: *No Product type* /    Admit Date/Time:  08/15/2020  2:18 PM  Primary Diagnosis:  Acute ischemic right MCA stroke [facility])  Hospital Problems: Principal Problem:   Acute ischemic right MCA stroke [facility]) Active Problems:   Atrial fibrillation, chronic ([place])   Oropharyngeal dysphagia   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 Alger Simons Care Coodinator Present: Loralee Pacas, LCSWA;Daleena Rotter Creig Hines, RN, BSN, CRRN Nurse Present: [name], RN PT Present: Apolinar Junes, PT OT Present: Laverle Hobby, OT SLP Present: Weston Anna, SLP PPS Coordinator present : Gunnar Fusi, SLP     Current Status/Progress Goal Weekly Team Focus  Bowel/Bladder             Swallow/Nutrition/ Hydration   Dys. 1 textures with honey-thick liquids, Mod-Max A  Mod A  tolerance of diet, use of swallow strategies   ADL's   Internally/externally distracted, can be as little as min A with UB ADLs, mod A with LB, intermittently like max A  mod-max A  d/c planning, family education, initiation, cognition, attention, ADLs   Mobility   mod-min A overall >75% of the time, intermittent max A with fatigue due to motor planning deficits, gait >200 ft mod/min A +2 with B HHA  Min A overall  Attention, initiation, cognitive retraining, motor planning, functional mobility, activity tolerance, balance, gait training, cervical ROM, patient/caregiver education, d/c planning   Communication   Mod-Max A  Mod A  initiation of verbal  expression, increase in vocal intensity   Safety/Cognition/ Behavioral Observations  Max A  Mod A  arousal, attention, initiation   Pain             Skin               Discharge Planning:  Pt to d/c to SNF at [facility]. D/c pending medical clearance.   Team Discussion: NG tube is out, eating better. Question if getting enough fluids. [facility] may need to run fluids at night. May start fluids tonight. Incontinent B/B, no pain reported.  Patient on target to meet rehab goals: Internally/externally distracted. Min assist upper body, mod assist lower body. Mod/max assist overall. Walking consistently. Training family today. Dys1, honey thick liquids. Improved PO intake, attention, and voice.  *See Care Plan and progress notes for long and short-term goals.   Revisions to Treatment Plan:  Discontinued NG tube, may start IVF at night.  Teaching Needs: Family education, medication management, sleep management, transfer training, gait training, balance training, endurance training, behavior management, safety training.  Current Barriers to Discharge: Decreased caregiver support, Medical stability, Home enviroment access/layout, Incontinence, Lack of/limited family support, Weight, Medication compliance, Behavior, and Nutritional means  Possible Resolutions to Barriers: Continue current medications, provide emotional support.     Medical Summary Current Status: sleeping better. now taking in enough PO that we removed NGT. still distractable. can fatigue easily  Barriers to Discharge: Medical stability   Possible Resolutions to [employer] Focus: ongoing supervision with po intake. maximize  sleep. consider IVF at night to supplement intake   Continued Need for Acute Rehabilitation Level of Care: The patient requires daily medical management by a physician with specialized training in physical medicine and rehabilitation for the following reasons: Direction of a multidisciplinary  physical rehabilitation program to maximize functional independence : Yes Medical management of patient stability for increased activity during participation in an intensive rehabilitation regime.: Yes Analysis of laboratory values and/or radiology reports with any subsequent need for medication adjustment and/or medical intervention. : Yes   I attest that I was present, lead the team conference, and concur with the assessment and plan of the team.   Cristi Loron 09/15/2020, 2:46 PM

## 2020-09-15 NOTE — Discharge Summary (Addendum)
Physician Discharge Summary  Patient ID: [name] [name] MRN: [id number] DOB/AGE: [birth year] 80 y.o.  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 No evidence of bowel obstruction. Possible mild ileus pattern. No acute bone finding. IMPRESSION: Soft feeding tube in place. Question mild ileus pattern. Findings not suggestive of obstruction. Electronically Signed   By: Nelson Chimes M.D.   On: 08/20/2020 11:35   CT HEAD WO CONTRAST  Result Date: 08/28/2020 CLINICAL DATA:  Seizure. EXAM: CT HEAD WITHOUT CONTRAST TECHNIQUE: Contiguous axial images were obtained from the base of the skull through the vertex without intravenous contrast. COMPARISON:  CT head 08/24/2020. FINDINGS: Brain: No substantial change involving right MCA territory infarct with slightly increased conspicuity of areas of gyriform petechial hemorrhage and/or laminar necrosis. No mass occupying acute hemorrhagic transformation. Similar overlying right cerebral convexity subdural hemorrhage status post evacuation. Mildly improved associated mass effect with approximately 3 mm of leftward midline shift at the foramen of Monro. Similar remote infarct along the posterior left cerebral convexity unchanged. Similar ventriculomegaly. Basal cisterns are patent. Vascular: No  hyperdense vessel identified. Skull: No acute fracture.  Right frontal craniotomy. Sinuses/Orbits: Small amount of fluid and mucosal thickening of the right maxillary sinus. Other: No mastoid effusions. IMPRESSION: Evolving right MCA territory infarct with slightly increased petechial hemorrhage and/or laminar necrosis. No mass occupying acute hemorrhagic transformation. Similar small right cerebral convexity subdural collection. Mass effect appears mildly improved with decreased (now 3 mm) of leftward midline shift at the foramen of Monro. Electronically Signed   By: Margaretha Sheffield MD   On: 08/28/2020 16:19   CT HEAD WO CONTRAST  Result Date: 08/24/2020 CLINICAL DATA:  Stroke, follow-up EXAM: CT HEAD WITHOUT CONTRAST TECHNIQUE: Contiguous axial images were obtained from the base of the skull through the vertex without intravenous contrast. COMPARISON:  None. FINDINGS: Brain: Evolving right MCA territory infarction is again identified with areas of gyriform petechial hemorrhage and/or laminar necrosis. Residual right cerebral convexity subdural collection post hematoma evacuation is unchanged. Mild mass effect due to both of these processes persists. There is no new hemorrhage. No new loss of gray-white differentiation. Chronic infarct along the posterior left cerebral convexity is again noted. No hydrocephalus. Vascular: There is atherosclerotic calcification at the skull base. Skull: Right craniotomy.  Otherwise unremarkable. Sinuses/Orbits: No acute finding. Other: None. IMPRESSION: Evolving right MCA territory infarction and residual right cerebral convexity subdural collection. Mild mass effect persists. No new hemorrhage. No new loss of gray-white differentiation. Electronically Signed   By: Macy Mis M.D.   On: 08/24/2020 18:20   CT HEAD WO CONTRAST  Result Date: 08/22/2020 CLINICAL DATA:  History of right MCA infarct and right-sided subdural hematoma. EXAM: CT HEAD WITHOUT CONTRAST TECHNIQUE:  Contiguous axial images were obtained from the base of the skull through the vertex without intravenous contrast. COMPARISON:  08/17/2020 FINDINGS: Brain:  There is continued evolution of the right MCA territory infarct seen previously. Decreased cortical edema and sulcal effacement. Continued leftward midline shift measuring approximately 7 mm at the level of the septum pellucidum. Chronic small vessel ischemic changes are seen throughout the periventricular white matter. Lateral ventricles and midline structures are stable. The small residual right-sided subdural hematoma deep to the craniotomy site is unchanged, measuring less than 5 mm. Vascular: No hyperdense vessel or unexpected calcification. Skull: Stable postsurgical changes from right-sided craniotomy. No acute bony abnormality. Sinuses/Orbits: Small gas fluid level right maxillary sinus. Remaining sinuses are clear. Other: None. IMPRESSION: 1. Continued evolution of the right MCA territory infarct, with decreasing sulcal effacement but persistent leftward midline shift. 2. Stable small residual right hemispheric subdural hematoma, measuring less than 5 mm in thickness. Electronically Signed   By: Randa Ngo M.D.   On: 08/22/2020 21:22   MR BRAIN WO CONTRAST  Result Date: 08/29/2020 CLINICAL DATA:  Neuro deficit with stroke suspected EXAM: MRI HEAD WITHOUT CONTRAST TECHNIQUE: Multiplanar, multiecho pulse sequences of the brain and surrounding structures were obtained without intravenous contrast. COMPARISON:  Head CT from yesterday.  Brain MRI 08/07/2020 FINDINGS: Brain: Subacute infarct in the right MCA territory, diffusely involving the inferior division cortex and involving the lateral putamen. Diffuse petechial hemorrhage. 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 There is also mineralization at the basal ganglia on the right. Extra-axial subdural collections along the right more than left convexity with up to 7 mm thickness  along the superior right frontal lobe. There is also epidural collection at the level of the right parietal bone flap, unchanged. Midline shift measures 3-4 mm. Encephalomalacia in the left parietal lobe adjacent to the extra-axial hemorrhage. No mass or obstructive hydrocephalus. Vascular: Preserved flow voids Skull and upper cervical spine: Unremarkable right-sided craniotomy Sinuses/Orbits: Bilateral cataract resection IMPRESSION: 1. No acute or interval finding. 2. Evolving right MCA distribution infarct and bilateral extra-axial hemorrhage. Midline shift to the left measures 3-4 mm. Electronically Signed   By: Monte Fantasia M.D.   On: 08/29/2020 11:41   DG Abd Portable 1V  Result Date: 09/01/2020 CLINICAL DATA:  Check feeding catheter placement EXAM: PORTABLE ABDOMEN - 1 VIEW COMPARISON:  08/26/2020 FINDINGS: Feeding catheter is again noted in the distal aspect of the stomach. The overall appearance is stable from the prior exam. Scattered large and small bowel gas is noted. IMPRESSION: Stable appearance of feeding catheter in the distal stomach. Electronically Signed   By: Inez Catalina M.D.   On: 09/01/2020 15:29   DG Abd Portable 1V  Result Date: 08/26/2020 CLINICAL DATA:  NG tube placement EXAM: PORTABLE ABDOMEN - 1 VIEW COMPARISON:  None. FINDINGS: Nonobstructive bowel gas pattern. There is a nasoenteric feeding catheter with tip overlying the distal stomach. No acute osseous abnormality. IMPRESSION: Nasoenteric feeding catheter tip overlies the distal stomach. Electronically Signed   By: Maurine Simmering   On: 08/26/2020 11:00   DG Abd Portable 1V  Result Date: 08/21/2020 CLINICAL DATA:  NG placement EXAM: PORTABLE ABDOMEN - 1 VIEW COMPARISON:  08/20/2020 FINDINGS: Feeding tube is been advanced with the tip now in the region of the gastric antrum. Normal bowel gas pattern. IMPRESSION: Feeding tube tip advanced to the gastric antral region. Electronically Signed   By: Franchot Gallo M.D.   On:  08/21/2020 10:07   DG Abd Portable 1V  Result Date: 08/20/2020 CLINICAL DATA:  OG tube placement EXAM: PORTABLE ABDOMEN - 1 VIEW COMPARISON:  08/20/2020 FINDINGS: Enteric  tube tip is in the left upper quadrant consistent with location in the upper stomach. Scattered gas in normal caliber small and large bowel. IMPRESSION: Enteric tube tip is in the left upper quadrant consistent with location in the upper stomach. Electronically Signed   By: Lucienne Capers M.D.   On: 08/20/2020 20:45   DG Swallowing Func-Speech Pathology  Result Date: 09/02/2020 Formatting of this result is different from the original. 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  External hemorrhoids without mention of complication   H/O aortic valve replacement   H/O mitral valve replacement   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  Lesion of ulnar nerve   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   Other and unspecified hyperlipidemia   Pre-diabetes   Previous back surgery 1978, jan 2007  Psychosexual dysfunction with inhibited sexual excitement   SOB (shortness of breath)   "with heavy exercise"  Stroke ([place]) 08/2013  "I WAS IN AFIB AND THREW A CLOT, THE EFFECTS WERE TRANSITORY AND DIDNT LAST BUT FOR 30 MINUTES"   Thoracic aortic aneurysm [facility])  Past Surgical History: Past Surgical History: Procedure Laterality Date  AORTIC AND MITRAL VALVE REPLACEMENT    09/2004  CARDIOVERSION    3 times from 2004-2006  CARDIOVERSION N/A 09/26/2013  Procedure: CARDIOVERSION;  Surgeon: Larey Dresser, MD;  Location: [place];  Service: Cardiovascular;  Laterality: N/A;  CARDIOVERSION N/A 06/19/2014  Procedure: CARDIOVERSION;  Surgeon: Jerline Pain, MD;  Location: [place];  Service: Cardiovascular;  Laterality: N/A;  CARDIOVERSION N/A 04/24/2017  Procedure: CARDIOVERSION;  Surgeon: Larey Dresser, MD;  Location: [place];  Service: Cardiovascular;  Laterality: N/A;  CARDIOVERSION N/A 06/08/2017  Procedure: CARDIOVERSION;  Surgeon: Pixie Casino, MD;  Location: [place];  Service: Cardiovascular;  Laterality: N/A;  CARDIOVERSION N/A 08/24/2017  Procedure: CARDIOVERSION;  Surgeon: Skeet Latch, MD;  Location: [place];  Service: Cardiovascular;  Laterality: N/A;  CARDIOVERSION N/A 02/14/2018  Procedure: CARDIOVERSION;  Surgeon: Larey Dresser, MD;  Location: [facility] ENDOSCOPY;  Service: Cardiovascular;  Laterality: N/A;  COLONOSCOPY    CRANIOTOMY N/A 07/28/2020  Procedure: CRANIOTOMY FOR EVACUATION OF SUBDURAL HEMATOMA;  Surgeon: Eustace Pinn, MD;  Location: [place];  Service: Neurosurgery;  Laterality: N/A;  IR ANGIO VERTEBRAL SEL SUBCLAVIAN INNOMINATE UNI R MOD SED  08/07/2020  IR CT HEAD LTD  08/07/2020  IR PERCUTANEOUS ART THROMBECTOMY/INFUSION INTRACRANIAL INC DIAG ANGIO  08/07/2020  laminectomies    10/1975 and in 03/2005  RADIOLOGY WITH ANESTHESIA N/A 08/07/2020  Procedure: IR WITH ANESTHESIA;  Surgeon: Luanne Bras, MD;  Location: [place];  Service: Radiology;  Laterality: N/A;  TONSILLECTOMY    1950  TOTAL HIP ARTHROPLASTY Right 04/03/2018  Procedure: TOTAL HIP ARTHROPLASTY ANTERIOR APPROACH;  Surgeon: Paralee Cancel, MD;  Location: [facility];  Service: Orthopedics;  Laterality: Right;  70 mins HPI: See H&P  No data recorded Assessment / Plan / Recommendation CHL IP CLINICAL IMPRESSIONS 09/02/2020 Clinical Impression Patient remained lethargic throughout MBS but demonstrated decreased restlessness and improved sustained attention to tasks. Patient demonstrates  a moderate oral dysphagia characterized by decreased  lip seal when combined with cognitive impairments resulted in hand over hand assist for self-feeding with cup and inability to utilize a straw. Prolonged AP transit with lingual pumping was also noted with all liquids and solids.  Patient with inconsistent timing and effort of swallow resulted in minimal sensed aspiration with thin liquids and mild pharyngeal residue that patient spontaneously cleared with a second swallow.  Recommend "snacks" of Dys. 1 textures with nectar-thick liquids with focus on attention and automaticity in hopes of initiating a diet quickly as mentation improves. Educated wife on results, she verbalized understanding and agreement. SLP Visit Diagnosis Dysphagia, oropharyngeal phase (R13.12) Attention and concentration deficit following -- Frontal lobe and executive function deficit following -- Impact on safety and function Moderate aspiration risk   CHL IP TREATMENT RECOMMENDATION 09/02/2020 Treatment Recommendations Therapy as outlined in treatment plan below   Prognosis 09/02/2020 Prognosis for Safe Diet Advancement Fair Barriers to Reach Goals Cognitive deficits;Severity of deficits Barriers/Prognosis Comment -- CHL IP DIET RECOMMENDATION 09/02/2020 SLP Diet Recommendations Alternative means - temporary;NPO Liquid Administration via -- Medication Administration Via alternative means Compensations -- Postural Changes --   CHL IP OTHER RECOMMENDATIONS 09/02/2020 Recommended Consults -- Oral Care Recommendations Oral care QID Other Recommendations --   CHL IP FOLLOW UP RECOMMENDATIONS 09/02/2020 Follow up Recommendations Inpatient Rehab   CHL IP FREQUENCY AND DURATION 09/02/2020 Speech Therapy Frequency (ACUTE ONLY) min 3x week Treatment Duration 2 weeks      CHL IP ORAL PHASE 09/02/2020 Oral Phase -- Oral - Pudding Teaspoon -- Oral - Pudding Cup -- Oral - Honey Teaspoon -- Oral - Honey Cup -- Oral - Nectar Teaspoon Reduced posterior propulsion;Lingual pumping;Decreased bolus cohesion;Delayed oral  transit Oral - Nectar Cup Decreased bolus cohesion;Delayed oral transit;Lingual pumping;Reduced posterior propulsion Oral - Nectar Straw -- Oral - Thin Teaspoon Reduced posterior propulsion;Delayed oral transit;Decreased bolus cohesion;Lingual pumping;Weak lingual manipulation Oral - Thin Cup -- Oral - Thin Straw -- Oral - Puree Weak lingual manipulation;Lingual pumping;Delayed oral transit Oral - Mech Soft -- Oral - Regular -- Oral - Multi-Consistency -- Oral - Pill -- Oral Phase - Comment --  CHL IP PHARYNGEAL PHASE 09/02/2020 Pharyngeal Phase Impaired Pharyngeal- Pudding Teaspoon -- Pharyngeal -- Pharyngeal- Pudding Cup -- Pharyngeal -- Pharyngeal- Honey Teaspoon -- Pharyngeal -- Pharyngeal- Honey Cup -- Pharyngeal -- Pharyngeal- Nectar Teaspoon Delayed swallow initiation-vallecula;Delayed swallow initiation-pyriform sinuses Pharyngeal -- Pharyngeal- Nectar Cup Delayed swallow initiation-vallecula;Delayed swallow initiation-pyriform sinuses;Pharyngeal residue - valleculae;Pharyngeal residue - pyriform Pharyngeal -- Pharyngeal- Nectar Straw -- Pharyngeal -- Pharyngeal- Thin Teaspoon Delayed swallow initiation-pyriform sinuses;Penetration/Aspiration during swallow Pharyngeal Material enters airway, passes BELOW cords then ejected out Pharyngeal- Thin Cup -- Pharyngeal -- Pharyngeal- Thin Straw -- Pharyngeal -- Pharyngeal- Puree Delayed swallow initiation-vallecula;Pharyngeal residue - valleculae Pharyngeal -- Pharyngeal- Mechanical Soft -- Pharyngeal -- Pharyngeal- Regular -- Pharyngeal -- Pharyngeal- Multi-consistency -- Pharyngeal -- Pharyngeal- Pill -- Pharyngeal -- Pharyngeal Comment --  No flowsheet data found. [place], [place] 09/02/2020, 12:45 PM    [place], [place], CCC-SLP [phone]           EEG adult  Result Date: 08/29/2020 Derek Jack, MD     08/29/2020  7:56 PM Routine EEG Report ASCHER SCHROEPFER is a 80 y.o. male with a history of L SDH and acute R MCA stroke who is undergoing an EEG to evaluate  for seizures. Report: This EEG was acquired with electrodes placed according to the International 10-20 electrode system (including Fp1, Fp2, F3,  F4, C3, C4, P3, P4, O1, O2, T3, T4, T5, T6, A1, A2, Fz, Cz, Pz). The following electrodes were missing or displaced: none. The occipital dominant rhythm was 7 Hz with overriding beta frequencies. This activity is reactive to stimulation. Drowsiness was manifested by background fragmentation; deeper stages of sleep were not identified. There was focal slowing over the left temporal region. There were no interictal epileptiform discharges. There were no electrographic seizures identified. Photic stimulation and hyperventilation were not performed due to history of recent stroke. Impression and clinical correlation: This EEG was obtained while awake and drowsy and is abnormal due to mild diffuse slowing and focal slowing over the left temporal region, reflecting generalized cerebral dysfunction with focal dysfunction superimposed over the left temporal region. Beta frequencies reflect medication effect. Su Monks, MD [facility] [phone] If 7pm- 7am, please page neurology on call as listed in [place].    Labs:  Basic Metabolic Panel: BMP Latest Ref Rng & Units 09/17/2020 09/15/2020 09/11/2020  Glucose 70 - 99 mg/dL 128(H) 107(H) 109(H)  BUN 8 - 23 mg/dL 23 20 27(H)  Creatinine 0.61 - 1.24 mg/dL 0.85 0.79 0.85  Sodium 135 - 145 mmol/L 140 140 141  Potassium 3.5 - 5.1 mmol/L 4.1 3.9 4.0  Chloride 98 - 111 mmol/L 107 107 109  CO2 22 - 32 mmol/L 26 27 27   Calcium 8.9 - 10.3 mg/dL 9.1 9.1 8.7(L)     CBC: CBC Latest Ref Rng & Units 09/15/2020 09/11/2020 09/08/2020  WBC 4.0 - 10.5 K/uL 11.7(H) 10.8(H) 11.8(H)  Hemoglobin 13.0 - 17.0 g/dL 11.8(L) 11.3(L) 11.6(L)  Hematocrit 39.0 - 52.0 % 36.3(L) 35.0(L) 35.8(L)  Platelets 150 - 400 K/uL 188 189 197     CBG: Recent Labs  Lab 09/16/20 0712 09/16/20 1141 09/16/20 1657 09/16/20 2100 09/17/20 0623   GLUCAP 108* 172* 124* 141* 112*    Brief HPI:   ELIZJAH NOBLET is a 80 y.o. male with history of diastolic CHF, AVR/MVR, A fib/A flutter- on coumadin/Tikosyn; who was admitted on 07/27/20 with difficulty speaking and right-sided weakness due to large right greater than left subdural hemorrhage with mass-effect and subfalcine herniation.  He was intubated for airway protection and family elected on DNR with comfort care due to poor prognosis.  However patient started showing improvement in ability to follow commands and was taken to the OR for right craniotomy and evacuation of SDH by Dr. Ronnald Ramp.  He tolerated extubation without difficulty however continued to be limited by left greater than right-sided weakness with visual deficits, poor safety awareness, delirium as well as cognitive deficits.  CIR was recommended by therapy team and he was admitted to rehab on 07/31/2020.  He was participating in therapy and making progress however on 06/03, he was found to have decreased LOC with left-sided weakness and right inattention.    Code stroke activated and CTA showed occluded right ICA therefore he underwent cerebral angio with revascularization of occluded right ICA terminus.  MRI brain done revealing chronic acute infarct throughout majority of right-MCA territory with some sparing of anterior insula and frontal operculum, petechial blood products and mass-effect with 7.5 mm right-to-left shift.  His mentation was improving however repeat CT showed 9 mm midline shift therefore he was started on hypertonic saline for management of edema and cortak  was placed for nutrition support.  He tolerated extubation on 06/08 but continued to have fluctuating mentation with bouts of lethargy.  Amantadine was added to help with activation.  Dr. [name] felt  that her right ICA occlusion possibly due to A. fib with interruption of AC in setting of recent bleed and recommended starting aspirin with consideration of DOAC in 10  days.  Patient continued to be limited by lethargy, minimal verbal output, delayed processing, left inattention with impulsivity and poor safety awareness.  CIR was recommended due to functional decline.   Hospital Course: [facility] was admitted to rehab 08/15/2020 for inpatient therapies to consist of PT, ST and OT at least three hours five days a week. Past admission physiatrist, therapy team and rehab RN have worked together to provide customized collaborative inpatient rehab.  Patient has had issues with sleep-wake disruption, lethargy as well as restlessness.  He was kept n.p.o. with tube feeds for nutritional support.  Pharmacy has been assisting in monitoring of electrolytes due to to Tikosyn on board.  Potassium supplements and mag gluconate added to help keep potassium > 4.0 and magnesium >2.o.  His heart rate was monitored on 3 times daily basis and has been stable.  Follow-up CBC shows H&H to be relatively stable.  He continues to have mild reactive leukocytosis without any signs of infection.  He was started on trazodone to help with sleep sleep-wake disruption and Ritalin was added additionally for activation.  He was found to have rising white count on 06/27 and urine culture done showing greater than 100,000 colonies of Kleb pneumo UTI.  He was treated with 7-day course of Keflex and mentation has improved with treatment of UTI.   CT head was repeated in 10 days and then a week later showing decrease in edema and Eliquis was added on 06/21.  Hospital course also significant for episode of left facial twitching followed by left greater than right twitching that lasted approximately 90 seconds.   EEG done showing mild diffuse slowing over left temporal region and no electrographic seizures noted.  CT of head was repeated showing evolving right MCA infarct with slight increase in petechial hemorrhage, mild improvement in mass-effect and midline shift and no acute hemorrhagic transformation. MRI  brain showed no acute finding. Neurology recommended loading with IV Keppa and patient was started on maintenance dose of 500 mg bid.   His mentation and energy levels are slowly improving.  He is showing improvement in alertness and motor planning.  MBS was performed on 06/29 showing moderate oral dysphagia with cognitive impairments requiring hand overhand assist for self-feeding.  He was started on trial feeds and diet of dysphagia 1, honey liquids was initiated on 09/09/20.  Tube feeds were discontinued on 07/11 as patient was able to maintain consistent p.o. intake. His swallow function continues to be impacted by lethargy and severe cognitive deficits. Recommend monitoring renal status frequently to monitor hydration status, encouragement of fluid intake and start IVF at nights for hydration. His progress has been limited by bouts of lethargy, restlessness and cognitive deficits. He currently requires min to mod assistance and family has elected on SNF for progressive therapy. He was discharged to [facility]F on 09/17/20   Rehab course: During patient's stay in rehab weekly team conferences were held to monitor patient's progress, set goals and discuss barriers to discharge. At admission, patient required max assist with mobility and total assist with basic ADL tasks.  He exhibited severe multifactorial dysphagia with decreased alertness, decreased ability to manage secretion as well as significant multifactorial cognitive linguistic deficits. He  has had improvement in activity tolerance, balance, postural control as well as ability to compensate for deficits.  He has had improvement in mentation, activity tolerance, attention and coordination. He is requires min to mod +2 assist for transfers and to ambulate 300' with tactile and manual facilitation for weight shifting and posture. He requires mod assist to propel his wheelchair.  He is consuming D1, honey liquids with minimal S/S of aspiration and  required min to mod multimodal cues for swallow strategies. He has had improvement in verbal output and clarity. He requires mod to max assist for basic functional tasks, problem solving, recall and safety awareness.     Disposition: Skilled Nursing Facility  Diet: Dysphagia 1, honey liquids. Administer medications crushed in puree  Special Instructions: 1. Encourage fluid intake and offer fluids between meals. 2. Continue to monitor renal status  every 5-7 days.  3. Patient needs to be upright and alert for meals. Needs full supervision for safety with meals.  4. Need to monitor BMET/Mg levels routinely and recommend K>4.0 and Mg>2.0. 5. Recommend IVF at nights to maintain hydration.   Discharge Instructions     Ambulatory referral to Physical Medicine Rehab   Complete by: As directed    4 week follow up appt      Allergies as of 09/17/2020       Reactions   Naproxen Hives   No [facility] Food Allergies Other (See Comments)   Scallops - distress, nausea and vomitting        Medication List     STOP taking these medications    amantadine 100 MG capsule Commonly known as: SYMMETREL Replaced by: amantadine 50 MG/5ML solution   aspirin 325 MG tablet   enoxaparin 40 MG/0.4ML injection Commonly known as: LOVENOX   free water Soln   metoprolol tartrate 25 mg/10 mL Susp Commonly known as: LOPRESSOR Replaced by: metoprolol tartrate 25 MG tablet   polyethylene glycol 17 g packet Commonly known as: MIRALAX / GLYCOLAX       TAKE these medications    acetaminophen 325 MG tablet Commonly known as: TYLENOL Take 1-2 tablets (325-650 mg total) by mouth every 4 (four) hours as needed for mild pain.   amantadine 50 MG/5ML solution Commonly known as: SYMMETREL Take 10 mLs (100 mg total) by mouth 2 (two) times daily at 7 am and noon. Replaces: amantadine 100 MG capsule   atorvastatin 80 MG tablet Commonly known as: LIPITOR Take 1 tablet (80 mg total) by mouth  daily. What changed: how to take this   clobetasol cream 0.05 % Commonly known as: TEMOVATE Apply 1 application topically daily as needed (for skin irritation).   dofetilide 500 MCG capsule Commonly known as: TIKOSYN Take 1 capsule (500 mcg total) by mouth 2 (two) times daily.   doxazosin 2 MG tablet Commonly known as: CARDURA Place 1 tablet (2 mg total) by mouth daily.   Eliquis 5 MG Tabs tablet Generic drug: apixaban Take 1 tablet (5 mg total) by mouth 2 (two) times daily.   feeding supplement (PROSource TF) liquid Place 45 mLs into feeding tube 3 (three) times daily. What changed: Another medication with the same name was removed. Continue taking this medication, and follow the directions you see here.   hydrocortisone cream 1 % Apply topically 2 (two) times daily as needed for itching (rash).   levETIRAcetam 500 MG tablet Commonly known as: KEPPRA Take 1 tablet (500 mg total) by mouth 2 (two) times daily.   losartan 25 MG tablet Commonly known as: COZAAR Place 1 tablet (25 mg total) by mouth daily.   magnesium  gluconate 500 MG tablet Commonly known as: MAGONATE Take 1 tablet (500 mg total) by mouth daily.   metoprolol tartrate 25 MG tablet Commonly known as: LOPRESSOR Take 1 tablet (25 mg total) by mouth 2 (two) times daily. Replaces: metoprolol tartrate 25 mg/10 mL Susp   pantoprazole sodium 40 mg/20 mL Pack Commonly known as: PROTONIX Place 20 mLs (40 mg total) by mouth daily.   polyvinyl alcohol 1.4 % ophthalmic solution Commonly known as: LIQUIFILM TEARS Place 1 drop into both eyes 2 (two) times daily.   potassium chloride 20 MEQ packet Commonly known as: KLOR-CON Take 40 mEq by mouth 2 (two) times daily.   senna-docusate 8.6-50 MG tablet Commonly known as: Senokot-S Take 1 tablet by mouth at bedtime as needed for moderate constipation. What changed: how to take this   sodium chloride 0.45 % solution Administer IV fluids at 50 cc/hr from 6 pm to 6  am daily till fluid intake improves/diet advanced.   traZODone 100 MG tablet Commonly known as: DESYREL Take 1 tablet (100 mg total) by mouth at bedtime.        Contact information for follow-up providers     Meredith Staggers, MD Follow up.   Specialty: Physical Medicine and Rehabilitation Why: Office will call you with follow up appointment Contact information: [address] [place] [address] [phone]         Earnie Larsson, MD. Call.   Specialty: Neurosurgery Why: for post op appointment Contact information: [address] [place] [address] [phone]         [facility]. Schedule an appointment as soon as possible for a visit.   Contact information: [address]     [address] [place] [address] [phone]             Contact information for after-discharge care     Destination     [facility] .   Service: Skilled Nursing Contact information: [place] [phone]                     Signed: Bary Leriche 09/17/2020, 9:11 AM

## 2020-09-15 NOTE — Progress Notes (Signed)
Occupational Therapy Discharge Summary  Patient Details  Name: [name] [name] MRN: [id number] Date of Birth: 29-Mar-1940  Today's Date: 09/16/2020 OT Individual Time: 0930-1030 OT Individual Time Calculation (min): 60 min    Patient has met 10 of 11 long term goals due to improved activity tolerance, improved balance, postural control, ability to compensate for deficits, functional use of  LEFT upper extremity, improved attention, improved awareness, and improved coordination.  Patient to discharge at overall Mod Assist level.  Patient's care partner unavailable to provide the necessary physical and cognitive assistance at discharge, therefore SNF discharge is the most appropriate and safe to continue pt's rehabilitation efforts.   Reasons goals not met: Pt is unable to be on ritalin to assist with his attention deficits. Cognitively, he is still unable to maintain focused attention to task without max cueing in a controlled environment.   Recommendation:  Patient will benefit from ongoing skilled OT services in skilled nursing facility setting to continue to advance functional skills in the area of BADL and Reduce care partner burden.  Equipment: No equipment provided  Reasons for discharge: treatment goals met and discharge from hospital  Patient/family agrees with progress made and goals achieved: Yes  Skilled OT Intervention: Pt received supine, wife present. He was very fatigued this session and unable to perform at his usual level. Session focused on skilled cueing for initiation, sequencing, motor planning, and attention during ADLs. MAX cueing required. He completed UB ADLs at max A level sitting at the sink. Max A for LB ADLs and to don pants. Pt often required max +2 to initiate stand and then would grasp motor plan and stand with only min A. He was able to maintain dynamic sitting balance with only min A. Pt's voice was much louder today and he would often say funny remarks. Pt  returned to his room and was left supine with all needs met, bed alarm set, posey waist belt on.    OT Discharge Precautions/Restrictions  Precautions Precautions: Fall Precaution Comments: fall, restless Restrictions Weight Bearing Restrictions: No   Pain Pain Assessment Pain Scale: Faces Faces Pain Scale: No hurt ADL ADL Eating: Minimal assistance Where Assessed-Eating: Chair Grooming: Minimal assistance Where Assessed-Grooming: Bed level Upper Body Bathing: Moderate assistance Where Assessed-Upper Body Bathing: Wheelchair Lower Body Bathing: Maximal assistance Where Assessed-Lower Body Bathing: Wheelchair Upper Body Dressing: Moderate assistance Where Assessed-Upper Body Dressing: Wheelchair Lower Body Dressing: Maximal assistance Where Assessed-Lower Body Dressing: Wheelchair Toileting: Moderate assistance Where Assessed-Toileting: Glass blower/designer: Moderate assistance Toilet Transfer Method: Counselling psychologist: Raised toilet seat Vision Baseline Vision/History: Wears glasses Wears Glasses: Reading only Patient Visual Report: Other (comment) (unable to report d/t cognitive deficits) Vision Assessment?: Yes Eye Alignment: Within Functional Limits Ocular Range of Motion: Restricted on the left Alignment/Gaze Preference: Gaze right Tracking/Visual Pursuits: Decreased smoothness of vertical tracking;Decreased smoothness of horizontal tracking (unable to formally test, still unable to track L) Saccades: Impaired - to be further tested in functional context Convergence: Impaired - to be further tested in functional context Additional Comments: Still presents with R gaze preference, does not spontaneously track to the L without multimodal cueing and manual facilitation of head turn Perception  Perception: Impaired Inattention/Neglect: Does not attend to left side of body Praxis Praxis: Impaired Praxis Impairment Details: Motor  planning;Initiation;Perseveration Cognition Overall Cognitive Status: Impaired/Different from baseline Arousal/Alertness: Awake/alert Orientation Level: Oriented to person Attention: Focused;Sustained Focused Attention: Impaired Focused Attention Impairment: Verbal basic;Functional basic Sustained Attention: Impaired Sustained Attention Impairment: Verbal basic;Functional  basic Memory: Impaired Memory Impairment: Storage deficit;Decreased recall of new information Decreased Short Term Memory: Verbal basic Awareness: Impaired Awareness Impairment: Intellectual impairment Problem Solving: Impaired Problem Solving Impairment: Functional basic;Verbal basic Executive Function:  (all impaired) Safety/Judgment: Impaired Comments: Pt has made big improvements cognitively but is still heavily limited by fatigue and fluctuates d/t this Sensation Sensation Light Touch: Appears Intact Hot/Cold: Appears Intact Proprioception: Impaired by gross assessment Coordination Gross Motor Movements are Fluid and Coordinated: No Fine Motor Movements are Fluid and Coordinated: No Coordination and Movement Description: L hemi, inattention impact coordination grossly Motor  Motor Motor: Hemiplegia;Abnormal postural alignment and control;Abnormal tone;Motor apraxia Motor - Discharge Observations: Pt able to use his LUE without restrition now, limited by inattention/neglect and cognitive deficits. Cervical limitations present now and forward flexed posture, along with generalized weakness Mobility  Bed Mobility Bed Mobility: Rolling Right;Rolling Left Rolling Right: Minimal Assistance - Patient > 75% Rolling Left: Minimal Assistance - Patient > 75% Supine to Sit: Moderate Assistance - Patient 50-74% Sit to Supine: Moderate Assistance - Patient 50-74% Transfers Sit to Stand: Minimal Assistance - Patient > 75% Stand to Sit: Minimal Assistance - Patient > 75%  Trunk/Postural Assessment  Cervical  Assessment Cervical Assessment: Exceptions to [facility] (head forward, cervical ROM limitations) Thoracic Assessment Thoracic Assessment: Exceptions to [facility] (rounded shoulders) Lumbar Assessment Lumbar Assessment: Exceptions to [facility] (posterior pelvic tilt) Postural Control Postural Control: Deficits on evaluation (delayed, inadequate)  Balance Balance Balance Assessed: Yes Static Sitting Balance Static Sitting - Balance Support: No upper extremity supported;Feet supported Static Sitting - Level of Assistance: 4: Min assist Dynamic Sitting Balance Dynamic Sitting - Balance Support: Feet supported Dynamic Sitting - Level of Assistance: 4: Min Insurance risk surveyor Standing - Balance Support: Bilateral upper extremity supported;During functional activity Static Standing - Level of Assistance: 4: Min assist Dynamic Standing Balance Dynamic Standing - Balance Support: During functional activity Dynamic Standing - Level of Assistance: 3: Mod assist Extremity/Trunk Assessment RUE Assessment RUE Assessment: Within Functional Limits LUE Assessment LUE Assessment: Exceptions to [facility] Strength Comments: pt spontaneously uses LUE without restriction, poor hand-eye-coordination and attention to L overall LUE Body System: Neuro Brunstrum levels for arm and hand: Arm;Hand Brunstrum level for arm: Stage V Relative Independence from Synergy Brunstrum level for hand: Stage VI Isolated joint movements   Curtis Sites 09/15/2020, 12:54 PM

## 2020-09-15 NOTE — Progress Notes (Signed)
At bedside to cap and flush PICC line.  Pt not in room or in gym at this time.  Secure chat sent to RN to reenter the consult once pt in room.

## 2020-09-15 NOTE — Progress Notes (Signed)
Physical Therapy Session Note  Patient Details  Name: [name] [name] MRN: [id number] Date of Birth: [birth year]  Today's Date: 09/15/2020 PT Co-Treatment Time: 1300-1330 PT Co-Treatment Time Calculation (min): 30 min  Short Term Goals: Week 4:  PT Short Term Goal 1 (Week 4): Patient will perform bed mobility with mod-min A of 1 person consistently. PT Short Term Goal 2 (Week 4): Patient will ambulate >100 feet with mod A >75% of the time and +2 w/c follow for safety  using LRAD. PT Short Term Goal 3 (Week 4): Patient will demonstrate sustained attention to a task in standing >4 min with mod cues for attention and min A for standing balance. PT Short Term Goal 4 (Week 4): Patient will perform basic transfers with mod-min A of 1 person with min verbal cues.  Skilled Therapeutic Interventions/Progress Updates:     Patient in bed with OT and his wife at bedside upon PT arrival. Patient internally and externally distracted throughout session and with intermittent time with eyes closed during session. Continues to required mod-max cuing for initiation of functional tasks. Patient participated in OT/PT co-treatment to assess patient's response to different cuing styles to improve consistency between therapists for management of attention and initiation and to work on finding salient tasks to motivate the patient to participate in functional mobility.   Therapeutic Activity: Bed Mobility: Patient performed supine to sit with min A for initiation mod-max A to block patient from returning to supine. Patient distracted by Posey belt on the bed and IV line until provided with an alternate task. Patient performed lower body dressing, held shorts on R/L with hand-over-hand assist and threaded lower extremities through his pants with total A. Patient intermittently attempted to remove his shorts and return to lying requiring PT and OT to block patient and redirect him to return to sitting.  Transfers: Patient  performed sit to/from stand from the bed with max a x1 without completing stand due to patient returning back to sitting against therapist's assist. He stood with the RW with min A x1, however, became externally distracted with buttons on the RW and performed sit to stand x2 with B HHA. Provided mod-max multimodal cues for forward weight shift and hip/knee/trunk extension.  Gait Training:  Patient ambulated ~20 feet using B HHA with min A +2. Ambulated with reluctance for forward propulsion, frequent stops, and continuous encouragement to focus on an external target to promote increased distance.   Wheelchair Mobility:  Patient propelled wheelchair ~15 feet with mod A and tendency to veer to the L. Provided visual target ahead and to the R, patient able to readjust trajectory with min A, but would undershoot and return to veering L due to L inattention. When ask, patient reported that he did not want to work on this task.   Neuromuscular Re-ed: Patient performed the following standing activities at the BITS: -patient stood >2 min with min A with HHA during visual scanning task with OT, demonstrated good weight shift when reaching in different directions and attended to task until the time finished  Patient responds best to short, clear one step commands. Attention to commands improves if he is asked to make eye contact with therapist with increased distractions or fatigue. Latency to response approximately 10-30 sec with these cues.   Patient in seated in w/c handed off to OT at end of session..   Therapy Documentation Precautions:  Precautions Precautions: Fall Precaution Comments: fall, restless Restrictions Weight Bearing Restrictions: No    Therapy/Group:  Co-Treatment  [name] [name] PT, DPT  09/15/2020, 2:26 PM

## 2020-09-15 NOTE — Progress Notes (Signed)
Speech Language Pathology Daily Session Note  Patient Details  Name: [name] [name] MRN: [id number] Date of Birth: 01/14/1941  Today's Date: 09/15/2020 SLP Individual Time: 2924-4628 SLP Individual Time Calculation (min): 60 min  Short Term Goals: Week 4: SLP Short Term Goal 1 (Week 4): STGs=LTGs due to ELOS  Skilled Therapeutic Interventions: Skilled treatment session focused on dysphagia and cognitive goals. Upon arrival, patient was awake in bed and reported he as hungry. SLP facilitated session by providing overall Min A verbal and tactile cues for initiation with self-feeding and Mod A verbal and tactile cues to locate items on table due to visual-perceptual deficits. Patient consumed breakfast meal of Dys. 1 textures with honey-thick liquids without overt s/s of aspiration with supervision verbal cues needed for small bites/sips and a slow rate of self-feeding. Recommend patient continue current diet. Patient with increased vocal intensity and increased initiation of verbal expression with intermittent language of confusion noted. Patient left upright in bed with RN and wife present. Continue with current plan of care     Pain No/Denies Pain   Therapy/Group: Individual Therapy  Melat Wrisley 09/15/2020, 9:58 AM

## 2020-09-15 NOTE — Progress Notes (Signed)
Patient ID: [name] [name], male   DOB: 08/18/1940, 80 y.o.   MRN: [id number]  SW met with pt wife Agnus in room to inform on d/c date being 7/14 and will schedule ambulance f/u. SW reviewed d/c process from here to SNF. She confirms meeting with SNF tomorrow afternoon. She also states that she will be here in Thursday morning to be present for when he leaves.   SW scheduled PTAR pick up for 9am on 7/14 to [facility]. SW left message for Donna/Director for Social Services with [facility] at [place] [phone]) to inform on above and encouraged f/u to discuss pt d/c needs.   Loralee Pacas, MSW, [place] Office: [phone] Cell: [phone] Fax: [phone]

## 2020-09-15 NOTE — Progress Notes (Addendum)
PROGRESS NOTE   Subjective/Complaints: Up eating breakfast with SLP. Doing a pretty good job initiating on his own  ROS: Limited due to cognitive/behavioral   Objective:   No results found. Recent Labs    09/15/20 0432  WBC 11.7*  HGB 11.8*  HCT 36.3*  PLT 188       Recent Labs    09/15/20 0432  NA 140  K 3.9  CL 107  CO2 27  GLUCOSE 107*  BUN 20  CREATININE 0.79  CALCIUM 9.1      Intake/Output Summary (Last 24 hours) at 09/15/2020 0916 Last data filed at 09/14/2020 1725 Gross per 24 hour  Intake 236 ml  Output --  Net 236 ml         Physical Exam: Vital Signs Blood pressure 117/81, pulse 82, temperature 98.5 F (36.9 C), temperature source Oral, resp. rate 17, height 5\' 7"  (1.702 m), weight 86 kg, SpO2 100 %. Constitutional: No distress . Vital signs reviewed. HEENT: EOMI, oral membranes moist Neck: supple Cardiovascular: RRR without murmur. No JVD    Respiratory/Chest: CTA Bilaterally without wheezes or rales. Normal effort    GI/Abdomen: BS +, non-tender, non-distended Ext: no clubbing, cyanosis, or edema Psych: engages. Flat  Skin: No evidence of breakdown, no evidence of rash, some bruising Musculoskeletal:    Cervical back: no axial or limb pain Neurological: alert. Responded to simple questions. Moves all 4's, right>left. Left inattention      Assessment/Plan: 1. Functional deficits which require 3+ hours per day of interdisciplinary therapy in a comprehensive inpatient rehab setting. Physiatrist is providing close team supervision and 24 hour management of active medical problems listed below. Physiatrist and rehab team continue to assess barriers to discharge/monitor patient progress toward functional and medical goals  Care Tool:  Bathing    Body parts bathed by patient: Face   Body parts bathed by helper: Buttocks, Left upper leg, Right upper leg, Right lower leg, Left lower  leg, Right arm, Left arm, Chest, Abdomen, Front perineal area     Bathing assist Assist Level: Maximal Assistance - Patient 24 - 49%     Upper Body Dressing/Undressing Upper body dressing   What is the patient wearing?: Pull over shirt    Upper body assist Assist Level: Maximal Assistance - Patient 25 - 49%    Lower Body Dressing/Undressing Lower body dressing      What is the patient wearing?: Pants     Lower body assist Assist for lower body dressing: Total Assistance - Patient < 25%     Toileting Toileting    Toileting assist Assist for toileting: Moderate Assistance - Patient 50 - 74%     Transfers Chair/bed transfer  Transfers assist     Chair/bed transfer assist level: 2 Helpers     Locomotion Ambulation   Ambulation assist   Ambulation activity did not occur: Safety/medical concerns  Assist level: 2 helpers Assistive device: Walker-rolling Max distance: 10   Walk 10 feet activity   Assist  Walk 10 feet activity did not occur: Safety/medical concerns  Assist level: 2 helpers Assistive device: Walker-rolling   Walk 50 feet activity   Assist Walk 50 feet  with 2 turns activity did not occur: Safety/medical concerns  Assist level: Maximal Assistance - Patient 25 - 49% Assistive device: Walker-rolling    Walk 150 feet activity   Assist Walk 150 feet activity did not occur: Safety/medical concerns         Walk 10 feet on uneven surface  activity   Assist Walk 10 feet on uneven surfaces activity did not occur: Safety/medical concerns         Wheelchair     Assist     Wheelchair activity did not occur: Safety/medical concerns         Wheelchair 50 feet with 2 turns activity    Assist    Wheelchair 50 feet with 2 turns activity did not occur: Safety/medical concerns       Wheelchair 150 feet activity     Assist  Wheelchair 150 feet activity did not occur: Safety/medical concerns       Blood pressure  117/81, pulse 82, temperature 98.5 F (36.9 C), temperature source Oral, resp. rate 17, height 5\' 7"  (1.702 m), weight 86 kg, SpO2 100 %.  Medical Problem List and Plan: 1.  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  Impaired mobility -DVT/anticoagulation:  Eliquis restarted 6/21.              -antiplatelet therapy: Stopped ASA 325 mg/day, confirmed with neurology that it's not necessary given that he's on eliquis 3. Pain Management: N/A 4. Mood: LCSW to follow for evaluation and support.              -antipsychotic agents: N/A 5. Neuropsych: This patient is not capable of making decisions on his own behalf. 6. Skin/Wound Care: Routine pressure relief measures.  7. Fluids/Electrolytes/Nutrition:    -po intake appears adequate. Needs supervision still.   -labs reviewed and WNL today. Still concerned that he won't get enough fluids in until upgraded to nectar +.  I/O's + yesterday 8. HTN: Monitor BP tid with SBP goal <140/90.              --continue losartan/day, cardura/day and Metoprolol every 6 hours Vitals:   09/15/20 0422 09/15/20 0802  BP: 130/79 117/81  Pulse: 79 82  Resp: 18 17  Temp: 97.9 F (36.6 C) 98.5 F (36.9 C)  SpO2: 99% 100%  7/12 controlled  9. T2DM: Hgb A1C- 6.2.   CBG (last 3)  Recent Labs    09/14/20 2359 09/15/20 0419 09/15/20 0749  GLUCAP 105* 101* 104*  Controlled 7/12---cbg's to TID AC  10. Lethargy: improved -continue 200mg  amantadine -rx'ed UTI   11. PAF: Monitor HR tid--eliquis restarted --SDH post fall 07/27/20             --continue Tikosyn bid             --  pharmacy monitoring K+>4.0/Mg levels, supplementing prn    12. Thoracic aortic aneurysm: Followed by [facility]. 14. Hypokalemia: increased repletion  to 39meq BID   -3.9 7/5   15. UTI: completed kelfex course 6/26.  -6/30  100k pseudomonas--- levaquin completed  -slighty leukocytosis still--stable 16. Abdominal distention: resolved  -tolerating TF currently  -dc'ed miralax,  Spoke with wife reviewed chart   77.  Insomnia:   -pt was a night owl PTA, typically staying up to 0200 and waking at 1100 each day  -has generally improved with 100mg  trazodone 18. Seizure activity: 90 second episode that self resolved on 6/24. Neuro consulted, appreciate recs, IV Ativan PRN, Keppra, seizure precautions  -none since keppra initiated  19. Dysphagia:currently D1/honeys by SLP     LOS: 31 days A FACE TO [place] 09/15/2020, 9:16 AM

## 2020-09-16 ENCOUNTER — Other Ambulatory Visit (HOSPITAL_COMMUNITY): Payer: Self-pay

## 2020-09-16 LAB — GLUCOSE, CAPILLARY
Glucose-Capillary: 108 mg/dL — ABNORMAL HIGH (ref 70–99)
Glucose-Capillary: 172 mg/dL — ABNORMAL HIGH (ref 70–99)
Glucose-Capillary: 124 mg/dL — ABNORMAL HIGH (ref 70–99)
Glucose-Capillary: 141 mg/dL — ABNORMAL HIGH (ref 70–99)

## 2020-09-16 MED ORDER — AMANTADINE HCL 50 MG/5ML PO SOLN: 100.0000 mg | Freq: Two times a day (BID) | ORAL | Status: DC

## 2020-09-16 MED ORDER — APIXABAN 5 MG PO TABS
5.0000 mg | ORAL_TABLET | Freq: Two times a day (BID) | ORAL | 0 refills | Status: DC
  Filled 2020-09-16: qty 60, 30d supply, fill #0

## 2020-09-16 MED ORDER — AMANTADINE HCL 50 MG/5ML PO SOLN
100.0000 mg | Freq: Two times a day (BID) | ORAL | Status: DC
  Administered 2020-09-16 – 2020-09-17 (×3): 100 mg via ORAL
  Filled 2020-09-16 (×4): qty 10

## 2020-09-16 NOTE — Plan of Care (Signed)
  Problem: RH Balance Goal: LTG Patient will maintain dynamic sitting balance (PT) Description: LTG:  Patient will maintain dynamic sitting balance with assistance during mobility activities (PT) Outcome: Completed/Met Goal: LTG Patient will maintain dynamic standing balance (PT) Description: LTG:  Patient will maintain dynamic standing balance with assistance during mobility activities (PT) Outcome: Completed/Met   Problem: Sit to Stand Goal: LTG:  Patient will perform sit to stand with assistance level (PT) Description: LTG:  Patient will perform sit to stand with assistance level (PT) Outcome: Completed/Met   Problem: RH Bed Mobility Goal: LTG Patient will perform bed mobility with assist (PT) Description: LTG: Patient will perform bed mobility with assistance, with/without cues (PT). Outcome: Completed/Met   Problem: RH Bed to Chair Transfers Goal: LTG Patient will perform bed/chair transfers w/assist (PT) Description: LTG: Patient will perform bed to chair transfers with assistance (PT). Outcome: Completed/Met   Problem: RH Ambulation Goal: LTG Patient will ambulate in controlled environment (PT) Description: LTG: Patient will ambulate in a controlled environment, # of feet with assistance (PT). Outcome: Completed/Met Flowsheets (Taken 09/16/2020 1555) LTG: Pt will ambulate in controlled environ  assist needed:: (max A 50 ft with RW and 1 person assist, min A +2 B HHA 300 ft) -- Note: max A 50 ft with RW and 1 person assist, min A +2 B HHA 300 ft   Problem: RH Attention Goal: LTG Patient will demonstrate this level of attention during functional activites (PT) Description: LTG:  Patient will demonstrate this level of attention during functional activites (PT) Outcome: Completed/Met   Problem: RH Awareness Goal: LTG: Patient will demonstrate awareness during functional activites type of (PT) Description: LTG: Patient will demonstrate awareness during functional activites type  of (PT) Outcome: Completed/Met   Problem: RH Car Transfers Goal: LTG Patient will perform car transfers with assist (PT) Description: LTG: Patient will perform car transfers with assistance (PT). Outcome: Not Applicable Flowsheets (Taken 09/16/2020 1555) LTG: Pt will perform car transfers with assist:: (patient to d/c SNF and will not need to perform car transfers at d/c.) -- Note: patient to d/c SNF and will not need to perform car transfers at d/c.

## 2020-09-16 NOTE — Progress Notes (Signed)
Patient ID: KYNDALL CHAPLIN, male   DOB: [birth year], 80 y.o.   MRN: [id number]  SW received message from Donna/Director for Social Services with [facility] [phone]) to report ready to receive pt tomorrow, and pt will need negative COVID test; can pull all additional information through [facility].  *SW spoke with Donna/[facility] to review above. Nurse Report [phone]. SW requested COVID test.   SW saw pt wife, and she asked about pt being sent home with 30-day supply of Eliquis. SW informed will f/u with physicians to get more information. SW shared pt wife concerns with physicians.   Loralee Pacas, MSW, [place] Office: [phone] Cell: [phone] Fax: [phone]

## 2020-09-16 NOTE — Progress Notes (Signed)
Occupational Therapy Session Note  Patient Details  Name: [name] [name] MRN: [id number] Date of Birth: 24-Nov-1940  Today's Date: 09/16/2020 OT Individual Time: 0700-0727 OT Individual Time Calculation (min): 27 min    Short Term Goals: Week 1:  OT Short Term Goal 1 (Week 1): Pt will attend to ADL task for 1 minute with mod cueing OT Short Term Goal 1 - Progress (Week 1): Progressing toward goal OT Short Term Goal 2 (Week 1): Pt will don shirt with max A OT Short Term Goal 2 - Progress (Week 1): Met OT Short Term Goal 3 (Week 1): Pt will use BUE to bathe UB with mod A OT Short Term Goal 3 - Progress (Week 1): Progressing toward goal  Skilled Therapeutic Interventions/Progress Updates:     Pt received in bed with no pain reported  ADL:  Pt with incontinent bladder in brief. Total A overall for brief change. Pt requires increased time to process all cues and total HOH A 50% of time for peri cleansing and rolling in bed. Up to 30 secs given to process cues, however attention impacting performance  Therapeutic exercise Pt provided with PROM of cervical spine d/t tightness of R SCM and trap d/t L neglect. 2x2 min cervical rotation L and lateral flexion L provided    Pt left at end of session in bed with exit alarm on, call light in reach and all needs met   Therapy Documentation Precautions:  Precautions Precautions: Fall Precaution Comments: fall, restless Restrictions Weight Bearing Restrictions: No General:   Vital Signs: Therapy Vitals Temp: 98.2 F (36.8 C) Temp Source: Oral Pulse Rate: 89 Resp: 17 BP: 119/80 Patient Position (if appropriate): Lying Oxygen Therapy SpO2: 98 % Pain:   ADL: ADL Eating: Minimal assistance Where Assessed-Eating: Chair Grooming: Minimal assistance Where Assessed-Grooming: Bed level Upper Body Bathing: Moderate assistance Where Assessed-Upper Body Bathing: Wheelchair Lower Body Bathing: Maximal assistance Where Assessed-Lower  Body Bathing: Wheelchair Upper Body Dressing: Moderate assistance Where Assessed-Upper Body Dressing: Wheelchair Lower Body Dressing: Maximal assistance Where Assessed-Lower Body Dressing: Wheelchair Toileting: Moderate assistance Where Assessed-Toileting: Glass blower/designer: Moderate assistance Toilet Transfer Method: Counselling psychologist: Raised toilet seat Vision   Perception    Praxis   Exercises:   Other Treatments:     Therapy/Group: Individual Therapy  [name] [name] 09/16/2020, 6:46 AM

## 2020-09-16 NOTE — Progress Notes (Signed)
Speech Language Pathology Discharge Summary  Patient Details  Name: [name] [name] MRN: [id number] Date of Birth: 12/14/1940  Today's Date: 09/16/2020 SLP Individual Time: 0800-0845 SLP Individual Time Calculation (min): 45 min   Skilled Therapeutic Interventions:  Skilled treatment session focused on dysphagia and cognitive goals. SLP facilitated session by providing overall Max A verbal cues for initiation and sustained attention to self-feeding with breakfast meal of Dys. 1 textures with honey-thick liquids. Patient consumed meal with overt cough X 1, suspect due to decreased attention to bolus. Due to patient's decreased attention. Recommend patient continue current diet. SLP had to dependently feed him to maximize PO intake. Patient's wife present and she provided appropriate cueing for PO intake. Patient left upright in bed with alarm on and all needs within reach. Continue with current plan of care.   Patient has met 6 of 6 long term goals.  Patient to discharge at overall Mod level.   Reasons goals not met: N/A   Clinical Impression/Discharge Summary: Patient has made inconsistent gains but overall has met 6 of 6 LTGs this admission. Currently, patient is consuming Dys. 1 textures with honey-thick liquids via cup with minimal overt s/s of aspiration and Min-Mod A multimodal cues for use of swallowing compensatory strategies. Patient's most recent MBS was on 6/29 which showed sensed aspiration with thin liquids. However, increased coughing was noted with nectar-thick liquids at the bedside so patient was downgraded to honey-thick liquids. Patient's overall swallowing function continues to be impacted by patient's intermittent lethargy and severe cognitive impairments. Patient demonstrates improved initiation of verbal expression with increased ability to express wants/needs at the phrase and sentence level. However, intelligibility is impacted by decreased vocal intensity and language of  confusion that is impacted by his overall fatigue cognitive functioning. Overall, patient requires overall Mod-Max A multimodal cues to complete basic functional tasks safely in regards to initiation, attention, orientation, problem solving, safety awareness and recall of functional information. Patient and family education is complete. Patient's family is unable to provide the necessary physical and cognitive assistance needed at this time, therefore, patient will discharge to a SNF. Patient would benefit from f/u SLP services to maximize his cognitive and swallowing function as well as his functional communication in order to reduce caregiver burden.   Care Partner:  Caregiver Able to Provide Assistance: No  Type of Caregiver Assistance: Physical;Cognitive  Recommendation:  Skilled Nursing facility;24 hour supervision/assistance  Rationale for SLP Follow Up: Reduce caregiver burden;Maximize functional communication;Maximize cognitive function and independence;Maximize swallowing safety   Equipment: Thickener   Reasons for discharge: Discharged from hospital;Treatment goals met   Patient/Family Agrees with Progress Made and Goals Achieved: Yes    ,  09/16/2020, 6:16 AM  

## 2020-09-16 NOTE — Progress Notes (Addendum)
Physical Therapy Discharge Summary  Patient Details  Name: [name] [name] MRN: [id number] Date of Birth: 28-Nov-1940  Today's Date: 09/16/2020 PT Individual Time: 1105-1200 PT Individual Time Calculation (min): 55 min    Patient has met 8 of 8 long term goals due to improved activity tolerance, improved balance, improved postural control, increased strength, ability to compensate for deficits, improved attention, improved awareness, and improved coordination.  Patient to discharge at  dependent wheelchair and min-mod A transfer ambulatory  level  with +2 assist for ambulation for safety .   Patient's care partner requires assistance to provide the necessary physical and cognitive assistance at discharge, therefore patient is to discharge to SNF to continue therapies for reduced burden of care and increased safety prior to discharge home.  Reasons goals not met: n/a  Recommendation:  Patient will benefit from ongoing skilled PT services in skilled nursing facility setting to continue to advance safe functional mobility, address ongoing impairments in attention, initiation, motor planning, activity tolerance, functional mobility, gait training, balance, safety awareness, cervical ROM, patient/family education, and minimize fall risk.  Equipment: Equipment to be provided at the next level of care  Reasons for discharge: discharge from hospital  Patient/family agrees with progress made and goals achieved: Yes  Skilled Therapeutic Intervention: Patient in bed asleep with [name] [name] at bedside upon PT arrival. Patient lethargic and minimally responsive to verbal and tactile cues, but breathing evenly. Patient's [name] reports that he has had 3 therapy sessions this morning and was very fatigued after the last session.   Focused session on family education. Provided handouts for d/c instructions for recommendations for working with patient on functional tasks, positioning, and management of environment,  and for cervical ROM exercises. Reviewed handouts and demonstrated cervical PROM and positioning patient in [name] side-lying. For improved cervical positioning in the bed. Discussed patient safety in the bed and recommending patient sit in TIS w/c or possibly a recliner for safe positioning OOB if available at the facility. Patient requested the urinal during session. Performed lower body clothing management and urinal placement with total A, educated patient's [name] on placement and positioning to reduce risk of spilling. Patient was unable to stay awake while attempting to void and was unsuccessful due to fatigue/decreased arousal. Patient performed scooting up in the bed with total A x2 and rolling R with mod A due to fatigue with pillows placed behind him to maintain position for increased tactile feedback to [name] [name] side and improved head and neck positioning due to R rotational preference.  Patient in R side-lying in bed with [name] [name] at bedside at end of session with breaks locked, Posey bed belt and bed alarm set, and all needs within reach.   PT Discharge Precautions/Restrictions Precautions Precautions: Fall Precaution Comments: fall, restless Restrictions Weight Bearing Restrictions: No Vision/Perception  Vision - Assessment Eye Alignment: Within Functional Limits Ocular Range of Motion: Restricted on the left Alignment/Gaze Preference: Gaze right Tracking/Visual Pursuits: Decreased smoothness of vertical tracking;Decreased smoothness of horizontal tracking Saccades: Impaired - to be further tested in functional context Convergence: Impaired - to be further tested in functional context Additional Comments: Still presents with R gaze preference, does spontaneously track to the [name] and able to locate visual targets on the [name] with touch screen task on BITS. Perception Perception: Impaired Inattention/Neglect: Does not attend to left side of body Figure Ground: Impaired, inattention vs vision  deficits in [name] Praxis Praxis: Impaired Praxis Impairment Details: Motor planning;Initiation;Perseveration  Cognition Overall Cognitive Status: Impaired/Different  from baseline Arousal/Alertness: Awake/alert Orientation Level: Oriented to person Attention: Focused;Sustained Focused Attention: Impaired Focused Attention Impairment: Verbal basic;Functional basic Sustained Attention: Impaired Sustained Attention Impairment: Verbal basic;Functional basic Selective Attention: Impaired Selective Attention Impairment: Functional basic Memory: Impaired Memory Impairment: Storage deficit;Decreased recall of new information;Decreased short term memory Awareness: Impaired Awareness Impairment: Intellectual impairment Problem Solving: Impaired Problem Solving Impairment: Functional basic;Verbal basic Safety/Judgment: Impaired Comments: Pt has made big improvements cognitively but is still heavily limited by fatigue and fluctuates d/t this Sensation Sensation Light Touch: Appears Intact Proprioception: Impaired by gross assessment Additional Comments: Sensation appears to be intact in BUEs with gross testing, pulls away from painful stimulus on [name] UE/LE Coordination Gross Motor Movements are Fluid and Coordinated: No Fine Motor Movements are Fluid and Coordinated: No Coordination and Movement Description: [name] hemi, inattention, delayed initiation, and decreased motor planing impact coordination grossly Motor  Motor Motor: Hemiplegia;Abnormal postural alignment and control;Abnormal tone;Motor apraxia Motor - Discharge Observations: Limited by inattention/neglect and cognitive deficits. Cervical limitations present limited [name] rotation and extension  Mobility Bed Mobility Bed Mobility: Rolling Right;Rolling Left Rolling Right: Minimal Assistance - Patient > 75% Rolling Left: Minimal Assistance - Patient > 75% Supine to Sit: Moderate Assistance - Patient 50-74% Sit to Supine: Moderate Assistance -  Patient 50-74% Transfers Transfers: Sit to Stand;Stand to Sit;Stand Pivot Transfers Sit to Stand: Minimal Assistance - Patient > 75% Stand to Sit: Minimal Assistance - Patient > 75% Stand Pivot Transfers: Moderate Assistance - Patient 50 - 74% Stand Pivot Transfer Details: Verbal cues for precautions/safety;Verbal cues for gait pattern;Verbal cues for technique;Tactile cues for weight shifting;Tactile cues for sequencing;Tactile cues for posture Transfer (Assistive device): 1 person hand held assist Locomotion  Gait Ambulation: Yes Gait Assistance: 2 Helpers;Minimal Assistance - Patient > 75%;Moderate Assistance - Patient 50-74% Gait Distance (Feet): 300 Feet Assistive device: 2 person hand held assist Gait Assistance Details: Verbal cues for sequencing;Verbal cues for gait pattern;Manual facilitation for weight shifting;Verbal cues for technique;Tactile cues for posture;Tactile cues for weight shifting Gait Gait: Yes Gait Pattern: Impaired Gait Pattern: Step-through pattern;Poor foot clearance - left;Poor foot clearance - right;Narrow base of support;Lateral trunk lean to right;Decreased dorsiflexion - right;Decreased dorsiflexion - left;Decreased stride length;Shuffle Gait velocity: reduced Stairs / Additional Locomotion Stairs: No Wheelchair Mobility Wheelchair Mobility: Yes (safety in sitting requires tilt in space wheelchair, trialed manual w/c x1 with significant sacral sitting) Wheelchair Assistance: Moderate Assistance - Patient 50 - 74% Wheelchair Propulsion: Both upper extremities Wheelchair Parts Management: Needs assistance Distance: 15 ft  Trunk/Postural Assessment  Cervical Assessment Cervical Assessment: Exceptions to [facility] (head forward, cervical ROM limitations) Thoracic Assessment Thoracic Assessment: Exceptions to [facility] (rounded shoulders) Lumbar Assessment Lumbar Assessment: Exceptions to [facility] (posterior pelvic tilt) Postural Control Postural Control: Deficits on  evaluation (delayed, inadequate)  Balance Static Sitting Balance Static Sitting - Balance Support: No upper extremity supported;Feet supported Static Sitting - Level of Assistance: 4: Min assist;5: Stand by assistance Dynamic Sitting Balance Dynamic Sitting - Balance Support: Feet supported Dynamic Sitting - Level of Assistance: 4: Min assist Static Standing Balance Static Standing - Balance Support: Right upper extremity supported;Left upper extremity supported;During functional activity Static Standing - Level of Assistance: 4: Min assist Dynamic Standing Balance Dynamic Standing - Balance Support: During functional activity;Right upper extremity supported;Left upper extremity supported Dynamic Standing - Level of Assistance: 4: Min assist Extremity Assessment  RLE Assessment RLE Assessment: Exceptions to [facility] Passive Range of Motion (PROM) Comments: Tight DF, able to get to neutral General Strength Comments: Grossly  at least 5/5 throughout, able to initiation all motions with functional mobility, limited by cognitive deficits LLE Assessment Passive Range of Motion (PROM) Comments: tight DF, able to get to neutral with prolonged stretch General Strength Comments: Grossly at least 5/5 throughout, able to initiation all motions with functional mobility, limited by cognitive deficits    [name] [name] [name] [name] PT, DPT  09/16/2020, 12:41 PM

## 2020-09-16 NOTE — Plan of Care (Signed)
Remains incontinent of bowel and bladder.

## 2020-09-17 ENCOUNTER — Other Ambulatory Visit (HOSPITAL_COMMUNITY): Payer: Self-pay

## 2020-09-17 DIAGNOSIS — Z7901 Long term (current) use of anticoagulants: Secondary | ICD-10-CM

## 2020-09-17 LAB — BASIC METABOLIC PANEL
Anion gap: 7 (ref 5–15)
BUN: 23 mg/dL (ref 8–23)
CO2: 26 mmol/L (ref 22–32)
Calcium: 9.1 mg/dL (ref 8.9–10.3)
Chloride: 107 mmol/L (ref 98–111)
Creatinine, Ser: 0.85 mg/dL (ref 0.61–1.24)
GFR, Estimated: >60 mL/min (ref >60)
Glucose, Bld: 128 mg/dL — ABNORMAL HIGH (ref 70–99)
Potassium: 4.1 mmol/L (ref 3.5–5.1)
Sodium: 140 mmol/L (ref 135–145)

## 2020-09-17 LAB — SARS CORONAVIRUS 2 (TAT 6-24 HRS): SARS Coronavirus 2: NEGATIVE

## 2020-09-17 LAB — GLUCOSE, CAPILLARY
Glucose-Capillary: 112 mg/dL — ABNORMAL HIGH (ref 70–99)
Glucose-Capillary: 166 mg/dL — ABNORMAL HIGH (ref 70–99)

## 2020-09-17 MED ORDER — MAGNESIUM OXIDE 400 MG PO TABS
400.0000 mg | ORAL_TABLET | Freq: Every day | ORAL | 0 refills | Status: DC
  Filled 2020-09-17: qty 1, 1d supply, fill #0

## 2020-09-17 MED ORDER — LEVETIRACETAM 500 MG PO TABS
500.0000 mg | ORAL_TABLET | Freq: Two times a day (BID) | ORAL | 0 refills | Status: DC
  Filled 2020-09-17: qty 2, 1d supply, fill #0

## 2020-09-17 MED ORDER — POTASSIUM CHLORIDE 20 MEQ PO PACK
40.0000 meq | PACK | Freq: Two times a day (BID) | ORAL | 0 refills | Status: DC
  Filled 2020-09-17: qty 4, 1d supply, fill #0

## 2020-09-17 MED ORDER — PANTOPRAZOLE SODIUM 40 MG PO PACK
40.0000 mg | PACK | Freq: Every day | ORAL | 0 refills | Status: DC
  Filled 2020-09-17: qty 20, 1d supply, fill #0

## 2020-09-17 MED ORDER — AMANTADINE HCL 50 MG/5ML PO SOLN
100.0000 mg | Freq: Two times a day (BID) | ORAL | 0 refills | Status: DC
  Filled 2020-09-17: qty 10, 1d supply, fill #0

## 2020-09-17 MED ORDER — DOXAZOSIN MESYLATE 2 MG PO TABS
2.0000 mg | ORAL_TABLET | Freq: Every day | ORAL | 0 refills | Status: DC
  Filled 2020-09-17: qty 1, 1d supply, fill #0

## 2020-09-17 MED ORDER — HEPARIN SOD (PORK) LOCK FLUSH 100 UNIT/ML IV SOLN
250.0000 [IU] | INTRAVENOUS | Status: AC | PRN
  Administered 2020-09-17: 250 [IU]
  Filled 2020-09-17: qty 2.5

## 2020-09-17 MED ORDER — SODIUM CHLORIDE 0.45 % IV SOLN: INTRAVENOUS | 0 refills | Status: DC

## 2020-09-17 MED ORDER — TRAZODONE HCL 100 MG PO TABS
100.0000 mg | ORAL_TABLET | Freq: Every day | ORAL | 0 refills | Status: DC
  Filled 2020-09-17: qty 1, 1d supply, fill #0

## 2020-09-17 NOTE — Progress Notes (Addendum)
Patient ID: ASCENCION COYE, male   DOB: 02-13-1941, 80 y.o.   MRN: [id number]  SW informed Donna/[facility] [phone]) to inform on pt will require IV fluids in the evening per medical team. She retrieved all information from hub. SW provided nursing for nursing report.   *SW received updates from nursing stating transportation left, and pt still needs medication per wife's request and facilities request for pt to have medication with him-that will now take 45 minutes. SW called [facility] ambulance for scheduled pick up at 11:45am. SW called Donna/[facility] to provide above updates.   Loralee Pacas, MSW, [place] Office: [phone] Cell: [phone] Fax: [phone]

## 2020-09-17 NOTE — Progress Notes (Signed)
PROGRESS NOTE   Subjective/Complaints: Has good appetite. Cleaning his breakfast plate this morning. Wife at bedside.  ROS: Limited due to cognitive/behavioral   Objective:   No results found. Recent Labs    09/15/20 0432  WBC 11.7*  HGB 11.8*  HCT 36.3*  PLT 188       Recent Labs    09/15/20 0432 09/17/20 0436  NA 140 140  K 3.9 4.1  CL 107 107  CO2 27 26  GLUCOSE 107* 128*  BUN 20 23  CREATININE 0.79 0.85  CALCIUM 9.1 9.1      Intake/Output Summary (Last 24 hours) at 09/17/2020 0916 Last data filed at 09/17/2020 0900 Gross per 24 hour  Intake 354 ml  Output --  Net 354 ml         Physical Exam: Vital Signs Blood pressure 102/67, pulse 87, temperature 97.9 F (36.6 C), resp. rate 18, height 5\' 7"  (1.702 m), weight 86 kg, SpO2 96 %. Constitutional: No distress . Vital signs reviewed. HEENT: EOMI, oral membranes moist Neck: supple Cardiovascular: RRR without murmur. No JVD    Respiratory/Chest: CTA Bilaterally without wheezes or rales. Normal effort    GI/Abdomen: BS +, non-tender, non-distended Ext: no clubbing, cyanosis, or edema Psych: flat but quick to engage Skin: No evidence of breakdown, no evidence of rash, some bruising Musculoskeletal:    Cervical back: no axial or limb pain Neurological: alert. Responded to simple questions. Moves all 4's, right>left. Left inattention ongoing but can be cued to left side      Assessment/Plan: 1. Functional deficits which require 3+ hours per day of interdisciplinary therapy in a comprehensive inpatient rehab setting. Physiatrist is providing close team supervision and 24 hour management of active medical problems listed below. Physiatrist and rehab team continue to assess barriers to discharge/monitor patient progress toward functional and medical goals  Care Tool:  Bathing    Body parts bathed by patient: Face, Right arm, Left arm, Chest,  Abdomen, Front perineal area, Buttocks, Right upper leg, Left upper leg   Body parts bathed by helper: Right lower leg, Left lower leg     Bathing assist Assist Level: Moderate Assistance - Patient 50 - 74%     Upper Body Dressing/Undressing Upper body dressing   What is the patient wearing?: Pull over shirt    Upper body assist Assist Level: Maximal Assistance - Patient 25 - 49%    Lower Body Dressing/Undressing Lower body dressing      What is the patient wearing?: Pants     Lower body assist Assist for lower body dressing: Maximal Assistance - Patient 25 - 49%     Toileting Toileting    Toileting assist Assist for toileting: Maximal Assistance - Patient 25 - 49%     Transfers Chair/bed transfer  Transfers assist     Chair/bed transfer assist level: Moderate Assistance - Patient 50 - 74%     Locomotion Ambulation   Ambulation assist   Ambulation activity did not occur: Safety/medical concerns  Assist level: 2 helpers Assistive device: Walker-rolling Max distance: 10   Walk 10 feet activity   Assist  Walk 10 feet activity did not occur: Safety/medical  concerns  Assist level: 2 helpers Assistive device: Walker-rolling   Walk 50 feet activity   Assist Walk 50 feet with 2 turns activity did not occur: Safety/medical concerns  Assist level: Maximal Assistance - Patient 25 - 49% Assistive device: Walker-rolling    Walk 150 feet activity   Assist Walk 150 feet activity did not occur: Safety/medical concerns         Walk 10 feet on uneven surface  activity   Assist Walk 10 feet on uneven surfaces activity did not occur: Safety/medical concerns         Wheelchair     Assist     Wheelchair activity did not occur: Safety/medical concerns         Wheelchair 50 feet with 2 turns activity    Assist    Wheelchair 50 feet with 2 turns activity did not occur: Safety/medical concerns       Wheelchair 150 feet activity      Assist  Wheelchair 150 feet activity did not occur: Safety/medical concerns       Blood pressure 102/67, pulse 87, temperature 97.9 F (36.6 C), resp. rate 18, height 5\' 7"  (1.702 m), weight 86 kg, SpO2 96 %.  Medical Problem List and Plan: 1.  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  Impaired mobility -DVT/anticoagulation:  Eliquis restarted 6/21.              -antiplatelet therapy: no ASA  3. Pain Management: N/A 4. Mood: LCSW to follow for evaluation and support.              -antipsychotic agents: N/A 5. Neuropsych: This patient is not capable of making decisions on his own behalf.  -continue amantadine bid for arousal 6. Skin/Wound Care: Routine pressure relief measures.  7. Fluids/Electrolytes/Nutrition:    -po intake appears adequate. Needs supervision still.   -BUN sl elevated 23, think he'll need HS IVF supplementation to maintain hydration until he's on nectars and initiating more 8. HTN: Monitor BP tid with SBP goal <140/90.              --continue losartan/day, cardura/day and Metoprolol every 6 hours Vitals:   09/16/20 1927 09/17/20 0408  BP: 133/82 102/67  Pulse: (!) 104 87  Resp: 18 18  Temp: 98.3 F (36.8 C) 97.9 F (36.6 C)  SpO2: 98% 96%  7/13 controlled  9. T2DM: Hgb A1C- 6.2.   CBG (last 3)  Recent Labs    09/16/20 1657 09/16/20 2100 09/17/20 0623  GLUCAP 124* 141* 112*  Controlled 7/13---cbg's to TID AC  10. Lethargy: improved -continue 200mg  amantadine -rx'ed UTI   11. PAF: Monitor HR tid--eliquis restarted --SDH post fall 07/27/20             --continue Tikosyn bid             --K+ should be greater than 4 and mg++ 2  12. Thoracic aortic aneurysm: Followed by [facility]. 14. Hypokalemia: increased repletion to 60meq BID   -4.1 7/14   15. UTI: completed  kelfex course 6/26.  -6/30  100k pseudomonas--- levaquin completed  -slighty leukocytosis still--stable 16. Abdominal distention: resolved   -dc'ed miralax,  Spoke with wife reviewed chart   71.  Insomnia:   -pt was a night owl PTA, typically staying up to 0200 and waking at 1100 each day  -has generally improved with 100mg  trazodone--continue as otupt 18. Seizure activity: 90 second episode that self resolved on 6/24. Neuro consulted, appreciate recs, IV Ativan PRN, Keppra, seizure precautions  -none since keppra initiated  19. Dysphagia:currently D1/honeys by SLP--advance as tolerated     LOS: 33 days A FACE TO [place] 09/17/2020, 9:16 AM

## 2020-09-17 NOTE — Progress Notes (Signed)
INPATIENT REHABILITATION DISCHARGE NOTE   Discharge instructions by: Jeannene Patella PA  Verbalized understanding:yes  Skin care/Wound care: foam dressing changed left butt blanchable  Pain:none  IV's:pt will be discharged to [facility] with left midline  Tubes/Drains:none  Safety instructions:give to wife  Patient belongings:done  Discharged to:[facility]  Discharged BMW:[employer]  [facility] SW called for 1145  Pick up  Notes:Meds for home given to wife. Pam PA done instructions

## 2020-09-17 NOTE — NC FL2 (Signed)
[place] LEVEL OF CARE SCREENING TOOL     IDENTIFICATION  Patient Name: [name] [name] Birthdate: 14-Jan-1941 Sex: male Admission Date (Current Location): 08/15/2020  [facility] and [place] Number:  Herbalist and Address:  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[place])    Acute ischemic right MCA stroke ([place]) 08/15/2020   Leucocytosis 08/10/2020   CVA (cerebral vascular accident) ([place]) 08/07/2020   Middle cerebral artery embolism, 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   Subdural hemorrhage following injury ([place]) 07/31/2020   S/P craniotomy 07/28/2020   Subdural hematoma ([place]) 07/27/2020   Vertigo 08/30/2018   Obese 04/04/2018   S/P right THA, 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   Long term (current) use of anticoagulants 01/05/2018   History of food allergy 12/11/2017   Allergic reaction 12/11/2017   Emesis 12/11/2017   Visit for monitoring Tikosyn therapy 06/06/2017   Internal hemorrhoid, bleeding 05/19/2016   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   Aortic valve disorder 07/09/2015   Long term current use of anticoagulant therapy 01/19/2015   Atrial fibrillation, chronic (HCC) 01/20/2014   Elevated PSA 12/23/2013   Prediabetes 12/19/2013   Benign prostatic hyperplasia with urinary obstruction 10/29/2013   left frontal cerebral infarct secondary to afib 08/05/2013   Atrial flutter  ([place]) 05/17/2013   Essential hypertension 06/19/2012   Routine health maintenance 04/26/2011   Hyperlipidemia with target LDL less than 130 07/07/2008   History of colonic polyps 06/16/2008   ERECTILE DYSFUNCTION 09/25/2007    Orientation RESPIRATION BLADDER Height & Weight     Self, Time, Situation, Place  Normal Incontinent Weight: 189 lb 9.5 oz (86 kg) Height:  5\' 7"  (170.2 cm)  BEHAVIORAL SYMPTOMS/MOOD NEUROLOGICAL BOWEL NUTRITION STATUS      Incontinent Diet (D1 Honey Thick liquids)  AMBULATORY STATUS COMMUNICATION OF NEEDS Skin   Extensive Assist Verbally Normal                       Personal Care Assistance Level of Assistance  Bathing, Feeding, Dressing Bathing Assistance: Limited assistance Feeding assistance: Limited assistance Dressing Assistance: Limited assistance     Functional Limitations Info             [place]  PT (By licensed PT), OT (By licensed OT), Speech therapy     PT Frequency: 5xs a week OT Frequency: 5xs a week     Speech Therapy Frequency: 5xs a week      Contractures Contractures Info: Not present    Additional Factors Info  Code Status, Allergies Code Status Info: Full Allergies Info: Naproxen- Hives   No [place] Food Allergies           Current Medications (09/17/2020):  This is the current hospital active medication list Current Facility-Administered Medications  Medication Dose Route Frequency Provider Last Rate Last Admin   0.9 %  sodium chloride infusion   Intravenous PRN Meredith Staggers, MD 5 mL/hr at 08/17/20 1447 5 mL/hr at 08/17/20 1447   acetaminophen (TYLENOL) tablet 325-650 mg  325-650 mg Oral Q4H PRN Hammons, Kimberly B, RPH       alum & mag hydroxide-simeth (MAALOX/MYLANTA) 200-200-20 MG/5ML suspension 30 mL  30 mL Oral Q4H PRN Hammons, Kimberly B, RPH       amantadine (SYMMETREL) 50 MG/5ML solution 100 mg  100 mg Oral BID Alger Simons T, MD   100 mg at 09/17/20 0914   apixaban  (ELIQUIS) tablet 5 mg  5 mg Oral BID Hammons, Kimberly B, RPH   5 mg at 09/17/20 0913   atorvastatin (LIPITOR) tablet 80 mg  80 mg Oral QPM Hammons, Kimberly B, RPH   80 mg at 09/16/20 1725   bisacodyl (DULCOLAX) suppository 10 mg  10 mg Rectal Daily PRN Love, Pamela S, PA-C       chlorhexidine (PERIDEX) 0.12 % solution 15 mL  15 mL Mouth Rinse BID Love, Pamela S, PA-C   15 mL at 09/17/20 0915   Chlorhexidine Gluconate Cloth 2 % PADS 6 each  6 each Topical BID Meredith Staggers, MD   6 each at 09/17/20 0656   diphenhydrAMINE (BENADRYL) 12.5 MG/5ML elixir 12.5-25 mg  12.5-25 mg Oral Q6H PRN Hammons, Theone Murdoch, RPH       dofetilide (TIKOSYN) capsule 500 mcg  500 mcg Oral BID Hammons, Kimberly B, RPH   500 mcg at 09/17/20 0913   doxazosin (CARDURA) tablet 2 mg  2 mg Oral Daily Hammons, Kimberly B, RPH   2 mg at 09/17/20 0912   guaiFENesin-dextromethorphan (ROBITUSSIN DM) 100-10 MG/5ML syrup 5-10 mL  5-10 mL Oral Q6H PRN Hammons, Kimberly B, RPH       heparin lock flush 100 unit/mL  250 Units Intracatheter Prior to discharge Meredith Staggers, MD       hydrocortisone cream 1 %   Topical BID PRN Raulkar, Clide Deutscher, MD       insulin aspart (novoLOG) injection 0-9 Units  0-9 Units Subcutaneous TID AC Meredith Staggers, MD   1 Units at 09/16/20 1725   levETIRAcetam (KEPPRA) tablet 500 mg  500 mg Oral BID Hammons, Kimberly B, RPH   500 mg at 09/17/20 0913   LORazepam (ATIVAN) injection 2 mg  2 mg Intravenous Once PRN Donnetta Simpers, MD       losartan (COZAAR) tablet 25 mg  25 mg Oral Daily Hammons, Kimberly B, RPH   25 mg at 09/17/20 0913   magnesium gluconate (MAGONATE) tablet 500 mg  500 mg Oral Daily Hammons, Kimberly B, RPH   500 mg at 09/17/20 0913   MEDLINE mouth rinse  15 mL Mouth Rinse q12n4p Bary Leriche, PA-C   15 mL at 09/16/20 1800   metoprolol tartrate (LOPRESSOR) tablet 25 mg  25 mg Oral BID Hammons, Kimberly B, RPH   25 mg at 09/17/20 0913   pantoprazole sodium (PROTONIX) 40 mg/20 mL  oral suspension 40 mg  40 mg Oral Daily Hammons, Kimberly B, RPH   40 mg at 09/17/20 0915   polyvinyl alcohol (LIQUIFILM TEARS) 1.4 % ophthalmic solution 1 drop  1 drop Both Eyes BID Alger Simons T, MD   1 drop at 09/16/20 2211   potassium chloride (KLOR-CON) packet 40 mEq  40 mEq Oral BID Hammons, Kimberly B, RPH   40 mEq at 09/16/20 2210   sodium chloride flush (NS) 0.9 % injection 10-40  mL  10-40 mL Intracatheter PRN Bary Leriche, PA-C   10 mL at 09/15/20 0440   sodium phosphate (FLEET) 7-19 GM/118ML enema 1 enema  1 enema Rectal Once PRN Love, Pamela S, PA-C       traZODone (DESYREL) tablet 100 mg  100 mg Oral QHS Hammons, Theone Murdoch, RPH   100 mg at 09/16/20 2210     Discharge Medications: Please see discharge summary for a list of discharge medications.  Relevant Imaging Results:  Relevant Lab Results:   Additional Information SS#: [id number]  Rana Snare, LCSW

## 2020-09-17 NOTE — Progress Notes (Signed)
Inpatient Rehabilitation Care Coordinator Discharge Note  The overall goal for the admission was met for:   Discharge location: Yes. D/c to SNF- [facility]  Length of Stay: Yes. 32 days.   Discharge activity level: Yes. Max A  Home/community participation: Yes. Limited.   Services provided included: MD, RD, PT, OT, SLP, RN, CM, TR, Pharmacy, Neuropsych, and SW  Financial Services: Medicare  Choices offered to/list presented to:Yes  Follow-up services arranged: N/A  Comments (or additional information):  Patient/Family verbalized understanding of follow-up arrangements: Yes  Individual responsible for coordination of the follow-up plan: contact pt wife Herbert Pun #[phone]  Confirmed correct DME delivered: Rana Snare 09/17/2020    Rana Snare

## 2020-09-17 NOTE — Progress Notes (Signed)
Routine line care. TL PICC remains in RUE. Site unremarkable. Please consider PICC exchange to single lumen to decrease risk for infection.

## 2020-09-18 ENCOUNTER — Encounter: Payer: Self-pay | Admitting: Adult Health

## 2020-09-18 ENCOUNTER — Non-Acute Institutional Stay (SKILLED_NURSING_FACILITY): Payer: Medicare Other | Admitting: Adult Health

## 2020-09-18 DIAGNOSIS — I6601 Occlusion and stenosis of right middle cerebral artery: Secondary | ICD-10-CM | POA: Diagnosis not present

## 2020-09-18 DIAGNOSIS — R278 Other lack of coordination: Secondary | ICD-10-CM | POA: Diagnosis not present

## 2020-09-18 DIAGNOSIS — R2681 Unsteadiness on feet: Secondary | ICD-10-CM | POA: Diagnosis not present

## 2020-09-18 DIAGNOSIS — R7303 Prediabetes: Secondary | ICD-10-CM | POA: Diagnosis not present

## 2020-09-18 DIAGNOSIS — R2689 Other abnormalities of gait and mobility: Secondary | ICD-10-CM | POA: Diagnosis not present

## 2020-09-18 DIAGNOSIS — R41841 Cognitive communication deficit: Secondary | ICD-10-CM | POA: Diagnosis not present

## 2020-09-18 DIAGNOSIS — S069X1D Unspecified intracranial injury with loss of consciousness of 30 minutes or less, subsequent encounter: Secondary | ICD-10-CM | POA: Diagnosis not present

## 2020-09-18 DIAGNOSIS — E7800 Pure hypercholesterolemia, unspecified: Secondary | ICD-10-CM | POA: Diagnosis not present

## 2020-09-18 DIAGNOSIS — R1312 Dysphagia, oropharyngeal phase: Secondary | ICD-10-CM

## 2020-09-18 DIAGNOSIS — I69391 Dysphagia following cerebral infarction: Secondary | ICD-10-CM | POA: Diagnosis not present

## 2020-09-18 DIAGNOSIS — D72829 Elevated white blood cell count, unspecified: Secondary | ICD-10-CM | POA: Diagnosis not present

## 2020-09-18 DIAGNOSIS — M62561 Muscle wasting and atrophy, not elsewhere classified, right lower leg: Secondary | ICD-10-CM | POA: Diagnosis not present

## 2020-09-18 DIAGNOSIS — I10 Essential (primary) hypertension: Secondary | ICD-10-CM | POA: Diagnosis not present

## 2020-09-18 DIAGNOSIS — I63511 Cerebral infarction due to unspecified occlusion or stenosis of right middle cerebral artery: Secondary | ICD-10-CM

## 2020-09-18 DIAGNOSIS — M62562 Muscle wasting and atrophy, not elsewhere classified, left lower leg: Secondary | ICD-10-CM | POA: Diagnosis not present

## 2020-09-18 DIAGNOSIS — I482 Chronic atrial fibrillation, unspecified: Secondary | ICD-10-CM

## 2020-09-18 DIAGNOSIS — G4701 Insomnia due to medical condition: Secondary | ICD-10-CM | POA: Diagnosis not present

## 2020-09-18 DIAGNOSIS — S065X3S Traumatic subdural hemorrhage with loss of consciousness of 1 hour to 5 hours 59 minutes, sequela: Secondary | ICD-10-CM

## 2020-09-18 NOTE — Progress Notes (Signed)
Location:   [facility] Room Number: 834 Place of Service:  SNF [phone]) Provider:  Royal Hawthorn, NP  [name], MD  Patient Care Team: [name], MD as PCP - General (Internal Medicine) [name], MD as Consulting Physician (Ophthalmology) [name], MD (Internal Medicine)  Extended Emergency Contact Information Primary Emergency Contact: [name] Address: [place], [place] [address] [name] [name] of [place] Phone: [phone] Relation: Spouse Secondary Emergency Contact: [name], [place] [address] [name] [name] of [place] Phone: [phone] Relation: Son  Code Status:  Full Code Goals of care: Advanced Directive information Advanced Directives 09/18/2020  Does Patient Have a Medical Advance Directive? No  Type of Advance Directive -  Does patient want to make changes to medical advance directive? -  Copy of [place] in Chart? -  Would patient like information on creating a medical advance directive? No - Patient declined  Pre-existing out of facility DNR order (yellow form or pink MOST form) -     Chief Complaint  Patient presents with   [facility] follow up     HPI:  Pt is a 80 y.o. male seen today for an acute visit for seen for hospital follow up in the skilled rehab unit at [facility]. 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  Pt has had a protracted course in the hospital as follows.  Was seen for syncope and hitting his head in church on 5/22 in the ER. No acute findings at the time. Then he was admitted on 5/23 for difficulty speaking and decreased movement. CT head done revealing large R>L SDH with mass effect, subfalcine herniation and leftward midline shift of 1.8 cm with early left lateral ventricle trapping. At that  time he was given a poor prognosis and family decided on comfort care. Then he showed improvement and was taken to the OR on 5/24 for a right craniotomy with evacuation of right SDH.  He made some improvements in the rehab area afterwards and was treated with decadron. Then on 6/3 he had a decreased LOC and a stat CT was done revealing acute ischemic right MCA stroke with mass effect right to left shift. On 6/3 he went to IR and had a thrombectomy.  He went back to the hospital rehab after this and made small He had a cortek feeding tube for nutrition due to dysphagia which has now been removed. MBS was performed on 06/29 showing moderate oral dysphagia with cognitive impairments requiring hand overhand assist for self-feeding. He is on a puree diet with HTL and tolerating well but does not maintain hydration well and needs IVF at night per the hospital so he has a midline catheter in the [place].  During his hospitalization he had seizure activity and is on keppra. Also had a UTI and was treated with Keflex. He had issues with his sleep wake cycle and is on trazodone. Also has issues with distractibility and lethargy and is therefore on amantadine. He has been placed on Eliquis as well and was on coumadin prior to that which was held during the hospitalization.    Currently he is alert and intermittently able to f/c but continues to have periods of delirium, lethargy, and poor safety awareness. He was able to  ambulate with therapy at the hospital but they used a lift for transfers. Therapy will see him today at [facility] to establish a routine. His wife is on the unit and reports that he used to love to sleep in until 72 prior to his illness. Her goal is for him to establish a routine here in the rehab unit and continue to progress with therapy.     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    Other and unspecified hyperlipidemia    Pre-diabetes    Previous back surgery 1978, jan 2007   Psychosexual dysfunction with inhibited sexual excitement    SOB (shortness of breath)    "with heavy exercise"   Stroke ([place]) 08/2013   "I WAS IN AFIB AND THREW A CLOT, THE EFFECTS WERE TRANSITORY AND DIDNT LAST BUT FOR 30 MINUTES"    Thoracic aortic aneurysm [facility])    Past Surgical History:  Procedure Laterality Date   AORTIC AND MITRAL VALVE REPLACEMENT     09/2004   CARDIOVERSION     3 times from 2004-2006   CARDIOVERSION N/A 09/26/2013   Procedure: CARDIOVERSION;  Surgeon: Larey Dresser, MD;  Location: [place];  Service: Cardiovascular;  Laterality: N/A;   CARDIOVERSION N/A 06/19/2014   Procedure: CARDIOVERSION;  Surgeon: Jerline Pain, MD;  Location: [place];  Service: Cardiovascular;  Laterality: N/A;   CARDIOVERSION N/A 04/24/2017   Procedure: CARDIOVERSION;  Surgeon: Larey Dresser, MD;  Location: [place];  Service: Cardiovascular;  Laterality: N/A;   CARDIOVERSION N/A 06/08/2017   Procedure: CARDIOVERSION;  Surgeon: Pixie Casino, MD;  Location: [facility] ENDOSCOPY;  Service: Cardiovascular;  Laterality: N/A;   CARDIOVERSION N/A 08/24/2017   Procedure: CARDIOVERSION;  Surgeon: Skeet Latch, MD;  Location: [facility] ENDOSCOPY;  Service: Cardiovascular;  Laterality: N/A;   CARDIOVERSION N/A 02/14/2018   Procedure: CARDIOVERSION;  Surgeon: Larey Dresser, MD;  Location: [facility] ENDOSCOPY;  Service: Cardiovascular;  Laterality: N/A;   COLONOSCOPY     CRANIOTOMY N/A 07/28/2020   Procedure: CRANIOTOMY FOR  EVACUATION OF SUBDURAL HEMATOMA;  Surgeon: Eustace Gunn, MD;  Location: [place];  Service: Neurosurgery;  Laterality: N/A;   IR ANGIO VERTEBRAL SEL SUBCLAVIAN INNOMINATE UNI R MOD SED  08/07/2020   IR CT HEAD LTD  08/07/2020   IR PERCUTANEOUS ART THROMBECTOMY/INFUSION INTRACRANIAL INC DIAG ANGIO  08/07/2020   laminectomies     10/1975 and in 03/2005   RADIOLOGY WITH ANESTHESIA N/A 08/07/2020   Procedure: IR WITH ANESTHESIA;  Surgeon: Luanne Bras, MD;  Location: [place];  Service: Radiology;  Laterality: N/A;   TONSILLECTOMY     1950   TOTAL HIP ARTHROPLASTY Right 04/03/2018   Procedure: TOTAL HIP ARTHROPLASTY ANTERIOR APPROACH;  Surgeon: Paralee Cancel, MD;  Location: [facility];  Service: Orthopedics;  Laterality: Right;  70 mins    Allergies  Allergen Reactions   Naproxen Hives  No [facility] Food Allergies Other (See Comments)    Scallops - distress, nausea and vomitting    Allergies as of 09/18/2020       Reactions   Naproxen Hives   No [facility] Food Allergies Other (See Comments)   Scallops - distress, nausea and vomitting        Medication List        Accurate as of September 18, 2020  9:57 AM. If you have any questions, ask your nurse or doctor.          STOP taking these medications    magnesium oxide 400 MG tablet Commonly known as: MAG-OX Stopped by: Royal Hawthorn, NP       TAKE these medications    acetaminophen 325 MG tablet Commonly known as: TYLENOL Take 1-2 tablets (325-650 mg total) by mouth every 4 (four) hours as needed for mild pain.   amantadine 50 MG/5ML solution Commonly known as: SYMMETREL Take 10 mLs (100 mg total) by mouth 2 (two) times daily.   atorvastatin 80 MG tablet Commonly known as: LIPITOR Take 1 tablet (80 mg total) by mouth daily.   clobetasol cream 0.05 % Commonly known as: TEMOVATE Apply 1 application topically daily as needed (for skin irritation).   dofetilide 500 MCG capsule Commonly known as: TIKOSYN Take 1 capsule  (500 mcg total) by mouth 2 (two) times daily.   doxazosin 2 MG tablet Commonly known as: CARDURA Take 1 tablet (2 mg total) by mouth daily.   Eliquis 5 MG Tabs tablet Generic drug: apixaban Take 1 tablet (5 mg total) by mouth 2 (two) times daily.   feeding supplement (PROSource TF) liquid Place 45 mLs into feeding tube 3 (three) times daily.   hydrocortisone cream 1 % Apply topically 2 (two) times daily as needed for itching (rash).   levETIRAcetam 500 MG tablet Commonly known as: KEPPRA Take 1 tablet (500 mg total) by mouth 2 (two) times daily.   losartan 25 MG tablet Commonly known as: COZAAR Place 1 tablet (25 mg total) into feeding tube daily.   magnesium gluconate 500 MG tablet Commonly known as: MAGONATE Take 500 mg by mouth daily.   metoprolol tartrate 25 MG tablet Commonly known as: LOPRESSOR Take 1 tablet (25 mg total) by mouth 2 (two) times daily.   pantoprazole sodium 40 mg/20 mL Pack Commonly known as: PROTONIX Take 20 mLs (40 mg total) by mouth daily.   polyvinyl alcohol 1.4 % ophthalmic solution Commonly known as: LIQUIFILM TEARS Place 1 drop into both eyes 2 (two) times daily.   potassium chloride 20 MEQ packet Commonly known as: KLOR-CON Take 40 mEq by mouth 2 (two) times daily.   senna-docusate 8.6-50 MG tablet Commonly known as: Senokot-S Take 1 tablet by mouth at bedtime as needed for moderate constipation.   sodium chloride 0.45 % solution Administer IV fluids from 6 pm to 6 am daily till fluid intake improves/diet advanced.   traZODone 100 MG tablet Commonly known as: DESYREL Take 1 tablet (100 mg total) by mouth at bedtime.        Review of Systems  Unable to perform ROS: Mental status change   Immunization History  Administered Date(s) Administered   Fluad Quad(high Dose 65+) 12/06/2018   Influenza Whole 12/25/2008, 01/05/2010, 12/30/2011   Influenza, High Dose Seasonal PF 12/14/2016   Influenza,inj,Quad PF,6+ Mos 12/19/2013    Influenza,inj,quad, With Preservative 11/22/2017   Influenza-Unspecified 12/14/2015, 12/24/2019   Moderna SARS-COV2 Booster Vaccination 01/02/2020, 06/26/2020   Moderna Sars-Covid-2  Vaccination 04/08/2019, 05/05/2019   Pneumococcal Conjugate-13 12/19/2013   Pneumococcal Polysaccharide-23 09/24/2007, 05/22/2017   Td 12/25/2008   Tdap 03/22/2018   Zoster Recombinat (Shingrix) 07/16/2019   Zoster, Live 09/24/2007   Pertinent  Health Maintenance Due  Topic Date Due   INFLUENZA VACCINE  10/05/2020   PNA vac Low Risk Adult  Completed   Fall Risk  07/11/2019 06/27/2018 05/23/2017 05/22/2017 05/19/2016  Falls in the past year? 0 0 No No No  Number falls in past yr: 0 0 - - -  Injury with Fall? 0 - - - -  Risk for fall due to : Orthopedic patient - - - -  Risk for fall due to: Comment left hip pain; seeing ortho - - - -  Follow up Falls evaluation completed;Education provided - - - -   Functional Status Survey:    Vitals:   09/18/20 0946  BP: 121/75  Pulse: 89  Resp: 17  Temp: 97.7 F (36.5 C)  SpO2: 96%  Weight: 189 lb 9.6 oz (86 kg)  Height: 5\' 7"  (1.702 m)   Body mass index is 29.7 kg/m. Physical Exam Vitals and nursing note reviewed.  Constitutional:      General: He is not in acute distress.    Appearance: He is not diaphoretic.  HENT:     Head: Normocephalic and atraumatic.     Nose: Nose normal. No congestion.     Mouth/Throat:     Mouth: Mucous membranes are moist.     Pharynx: Oropharynx is clear. No oropharyngeal exudate.  Eyes:     General:        Right eye: No discharge.        Left eye: No discharge.     Conjunctiva/sclera: Conjunctivae normal.     Pupils: Pupils are equal, round, and reactive to light.  Neck:     Thyroid: No thyromegaly.     Vascular: No JVD.     Trachea: No tracheal deviation.  Cardiovascular:     Rate and Rhythm: Normal rate. Rhythm irregular.     Heart sounds: No murmur heard. Pulmonary:     Effort: Pulmonary effort is normal. No  respiratory distress.     Breath sounds: Normal breath sounds. No wheezing.  Abdominal:     General: Bowel sounds are normal. There is no distension.     Palpations: Abdomen is soft.     Tenderness: There is no abdominal tenderness.  Musculoskeletal:     Right lower leg: No edema.     Left lower leg: No edema.  Lymphadenopathy:     Cervical: No cervical adenopathy.  Skin:    General: Skin is warm and dry.  Neurological:     Mental Status: He is alert. He is disoriented.     Coordination: Coordination abnormal.     Comments: Not able to consistently f/c.  Able to move all extremities. No tremors. Distracts easily during exam. Not able to touch finger to nose. No obvious facial droop but he was not abel to f/c for CN testing.     Labs reviewed: Recent Labs    08/09/20 1611 08/09/20 2158 08/12/20 2154 08/13/20 0513 08/13/20 1200 08/14/20 0414 08/31/20 0410 09/08/20 0344 09/11/20 0402 09/15/20 0432 09/17/20 0436  NA 140   < > 152*   < >  --    < > 139 138 141 140 140  K  --    < > 3.3*   < >  --    < >  4.2 3.9 4.0 3.9 4.1  CL  --    < > 122*   < >  --    < > 105 106 109 107 107  CO2  --    < > 26   < >  --    < > 28 27 27 27 26   GLUCOSE  --    < > 174*   < >  --    < > 189* 156* 109* 107* 128*  BUN  --    < > 33*   < >  --    < > 19 28* 27* 20 23  CREATININE  --    < > 0.73   < >  --    < > 0.84 0.92 0.85 0.79 0.85  CALCIUM  --    < > 7.8*   < >  --    < > 8.8* 8.9 8.7* 9.1 9.1  MG 2.0   < > 2.3   < >  --    < > 2.0 2.0  --  2.0  --   PHOS 2.7  --  1.9*  --  2.2*  --   --   --   --   --   --    < > = values in this interval not displayed.   Recent Labs    07/27/20 1833 08/01/20 0610 08/16/20 0427  AST 34 39 74*  ALT 33 52* 100*  ALKPHOS 65 46 63  BILITOT 0.9 1.1 0.8  PROT 7.0 5.9* 5.2*  ALBUMIN 3.8 2.8* 2.2*   Recent Labs    08/08/20 0625 08/09/20 0451 08/16/20 0427 08/17/20 0340 09/08/20 0344 09/11/20 0402 09/15/20 0432  WBC 17.5* 16.4* 7.9   < > 11.8*  10.8* 11.7*  NEUTROABS 13.5* 12.6* 5.7  --   --   --   --   HGB 12.3* 11.6* 9.0*   < > 11.6* 11.3* 11.8*  HCT 36.7* 34.6* 28.2*   < > 35.8* 35.0* 36.3*  MCV 94.1 93.5 98.6   < > 95.7 94.9 94.3  PLT 254 229 177   < > 197 189 188   < > = values in this interval not displayed.   Lab Results  Component Value Date   TSH 2.82 05/22/2017   Lab Results  Component Value Date   HGBA1C 6.2 (H) 08/08/2020   Lab Results  Component Value Date   CHOL 119 08/08/2020   HDL 42 08/08/2020   LDLCALC 56 08/08/2020   TRIG 105 08/08/2020   CHOLHDL 2.8 08/08/2020    Significant Diagnostic Results in last 30 days:  DG Abd 1 View  Result Date: 08/20/2020 CLINICAL DATA:  Abdominal distension EXAM: ABDOMEN - 1 VIEW COMPARISON:  08/10/2020 FINDINGS: Soft feeding tube tip in the region of the antrum or proximal duodenum. No evidence of bowel obstruction. Possible mild ileus pattern. No acute bone finding. IMPRESSION: Soft feeding tube in place. Question mild ileus pattern. Findings not suggestive of obstruction. Electronically Signed   By: Nelson Chimes M.D.   On: 08/20/2020 11:35   CT HEAD WO CONTRAST  Result Date: 08/28/2020 CLINICAL DATA:  Seizure. EXAM: CT HEAD WITHOUT CONTRAST TECHNIQUE: Contiguous axial images were obtained from the base of the skull through the vertex without intravenous contrast. COMPARISON:  CT head 08/24/2020. FINDINGS: Brain: No substantial change involving right MCA territory infarct with slightly increased conspicuity of areas of gyriform petechial hemorrhage and/or laminar necrosis. No mass occupying acute hemorrhagic  transformation. Similar overlying right cerebral convexity subdural hemorrhage status post evacuation. Mildly improved associated mass effect with approximately 3 mm of leftward midline shift at the foramen of Monro. Similar remote infarct along the posterior left cerebral convexity unchanged. Similar ventriculomegaly. Basal cisterns are patent. Vascular: No hyperdense  vessel identified. Skull: No acute fracture.  Right frontal craniotomy. Sinuses/Orbits: Small amount of fluid and mucosal thickening of the right maxillary sinus. Other: No mastoid effusions. IMPRESSION: Evolving right MCA territory infarct with slightly increased petechial hemorrhage and/or laminar necrosis. No mass occupying acute hemorrhagic transformation. Similar small right cerebral convexity subdural collection. Mass effect appears mildly improved with decreased (now 3 mm) of leftward midline shift at the foramen of Monro. Electronically Signed   By: Margaretha Sheffield MD   On: 08/28/2020 16:19   CT HEAD WO CONTRAST  Result Date: 08/24/2020 CLINICAL DATA:  Stroke, follow-up EXAM: CT HEAD WITHOUT CONTRAST TECHNIQUE: Contiguous axial images were obtained from the base of the skull through the vertex without intravenous contrast. COMPARISON:  None. FINDINGS: Brain: Evolving right MCA territory infarction is again identified with areas of gyriform petechial hemorrhage and/or laminar necrosis. Residual right cerebral convexity subdural collection post hematoma evacuation is unchanged. Mild mass effect due to both of these processes persists. There is no new hemorrhage. No new loss of gray-white differentiation. Chronic infarct along the posterior left cerebral convexity is again noted. No hydrocephalus. Vascular: There is atherosclerotic calcification at the skull base. Skull: Right craniotomy.  Otherwise unremarkable. Sinuses/Orbits: No acute finding. Other: None. IMPRESSION: Evolving right MCA territory infarction and residual right cerebral convexity subdural collection. Mild mass effect persists. No new hemorrhage. No new loss of gray-white differentiation. Electronically Signed   By: Macy Mis M.D.   On: 08/24/2020 18:20   CT HEAD WO CONTRAST  Result Date: 08/22/2020 CLINICAL DATA:  History of right MCA infarct and right-sided subdural hematoma. EXAM: CT HEAD WITHOUT CONTRAST TECHNIQUE: Contiguous  axial images were obtained from the base of the skull through the vertex without intravenous contrast. COMPARISON:  08/17/2020 FINDINGS: Brain: There is continued evolution of the right MCA territory infarct seen previously. Decreased cortical edema and sulcal effacement. Continued leftward midline shift measuring approximately 7 mm at the level of the septum pellucidum. Chronic small vessel ischemic changes are seen throughout the periventricular white matter. Lateral ventricles and midline structures are stable. The small residual right-sided subdural hematoma deep to the craniotomy site is unchanged, measuring less than 5 mm. Vascular: No hyperdense vessel or unexpected calcification. Skull: Stable postsurgical changes from right-sided craniotomy. No acute bony abnormality. Sinuses/Orbits: Small gas fluid level right maxillary sinus. Remaining sinuses are clear. Other: None. IMPRESSION: 1. Continued evolution of the right MCA territory infarct, with decreasing sulcal effacement but persistent leftward midline shift. 2. Stable small residual right hemispheric subdural hematoma, measuring less than 5 mm in thickness. Electronically Signed   By: Randa Ngo M.D.   On: 08/22/2020 21:22   MR BRAIN WO CONTRAST  Result Date: 08/29/2020 CLINICAL DATA:  Neuro deficit with stroke suspected EXAM: MRI HEAD WITHOUT CONTRAST TECHNIQUE: Multiplanar, multiecho pulse sequences of the brain and surrounding structures were obtained without intravenous contrast. COMPARISON:  Head CT from yesterday.  Brain MRI 08/07/2020 FINDINGS: Brain: Subacute infarct in the right MCA territory, diffusely involving the inferior division cortex and involving the lateral putamen. Diffuse petechial hemorrhage. 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 There is also mineralization at the basal ganglia on the right. Extra-axial subdural collections along the right  more than left convexity with up to 7 mm thickness along the  superior right frontal lobe. There is also epidural collection at the level of the right parietal bone flap, unchanged. Midline shift measures 3-4 mm. Encephalomalacia in the left parietal lobe adjacent to the extra-axial hemorrhage. No mass or obstructive hydrocephalus. Vascular: Preserved flow voids Skull and upper cervical spine: Unremarkable right-sided craniotomy Sinuses/Orbits: Bilateral cataract resection IMPRESSION: 1. No acute or interval finding. 2. Evolving right MCA distribution infarct and bilateral extra-axial hemorrhage. Midline shift to the left measures 3-4 mm. Electronically Signed   By: Monte Fantasia M.D.   On: 08/29/2020 11:41   DG Abd Portable 1V  Result Date: 09/01/2020 CLINICAL DATA:  Check feeding catheter placement EXAM: PORTABLE ABDOMEN - 1 VIEW COMPARISON:  08/26/2020 FINDINGS: Feeding catheter is again noted in the distal aspect of the stomach. The overall appearance is stable from the prior exam. Scattered large and small bowel gas is noted. IMPRESSION: Stable appearance of feeding catheter in the distal stomach. Electronically Signed   By: Inez Catalina M.D.   On: 09/01/2020 15:29   DG Abd Portable 1V  Result Date: 08/26/2020 CLINICAL DATA:  NG tube placement EXAM: PORTABLE ABDOMEN - 1 VIEW COMPARISON:  None. FINDINGS: Nonobstructive bowel gas pattern. There is a nasoenteric feeding catheter with tip overlying the distal stomach. No acute osseous abnormality. IMPRESSION: Nasoenteric feeding catheter tip overlies the distal stomach. Electronically Signed   By: Maurine Simmering   On: 08/26/2020 11:00   DG Abd Portable 1V  Result Date: 08/21/2020 CLINICAL DATA:  NG placement EXAM: PORTABLE ABDOMEN - 1 VIEW COMPARISON:  08/20/2020 FINDINGS: Feeding tube is been advanced with the tip now in the region of the gastric antrum. Normal bowel gas pattern. IMPRESSION: Feeding tube tip advanced to the gastric antral region. Electronically Signed   By: Franchot Gallo M.D.   On: 08/21/2020  10:07   DG Abd Portable 1V  Result Date: 08/20/2020 CLINICAL DATA:  OG tube placement EXAM: PORTABLE ABDOMEN - 1 VIEW COMPARISON:  08/20/2020 FINDINGS: Enteric tube tip is in the left upper quadrant consistent with location in the upper stomach. Scattered gas in normal caliber small and large bowel. IMPRESSION: Enteric tube tip is in the left upper quadrant consistent with location in the upper stomach. Electronically Signed   By: Lucienne Capers M.D.   On: 08/20/2020 20:45   DG Swallowing Func-Speech Pathology  Result Date: 09/02/2020 Formatting of this result is different from the original. 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  External hemorrhoids without mention of complication   H/O aortic valve replacement   H/O mitral valve replacement   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  Lesion of ulnar nerve   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   Other and unspecified hyperlipidemia   Pre-diabetes   Previous back surgery 1978, jan 2007  Psychosexual dysfunction with inhibited sexual excitement   SOB (shortness of breath)   "with heavy exercise"  Stroke ([place]) 08/2013  "I WAS IN AFIB AND THREW A CLOT, THE EFFECTS  WERE TRANSITORY AND DIDNT LAST BUT FOR 30 MINUTES"   Thoracic aortic aneurysm [facility])  Past Surgical History: Past Surgical History: Procedure Laterality Date  AORTIC AND MITRAL VALVE REPLACEMENT    09/2004  CARDIOVERSION    3 times from 2004-2006  CARDIOVERSION N/A 09/26/2013  Procedure: CARDIOVERSION;  Surgeon: Larey Dresser, MD;  Location: [place];   Service: Cardiovascular;  Laterality: N/A;  CARDIOVERSION N/A 06/19/2014  Procedure: CARDIOVERSION;  Surgeon: Jerline Pain, MD;  Location: [place];  Service: Cardiovascular;  Laterality: N/A;  CARDIOVERSION N/A 04/24/2017  Procedure: CARDIOVERSION;  Surgeon: Larey Dresser, MD;  Location: [place];  Service: Cardiovascular;  Laterality: N/A;  CARDIOVERSION N/A 06/08/2017  Procedure: CARDIOVERSION;  Surgeon: Pixie Casino, MD;  Location: [facility] ENDOSCOPY;  Service: Cardiovascular;  Laterality: N/A;  CARDIOVERSION N/A 08/24/2017  Procedure: CARDIOVERSION;  Surgeon: Skeet Latch, MD;  Location: [facility] ENDOSCOPY;  Service: Cardiovascular;  Laterality: N/A;  CARDIOVERSION N/A 02/14/2018  Procedure: CARDIOVERSION;  Surgeon: Larey Dresser, MD;  Location: [facility] ENDOSCOPY;  Service: Cardiovascular;  Laterality: N/A;  COLONOSCOPY    CRANIOTOMY N/A 07/28/2020  Procedure: CRANIOTOMY FOR EVACUATION OF SUBDURAL HEMATOMA;  Surgeon: Eustace Sebastiano, MD;  Location: [facility];  Service: Neurosurgery;  Laterality: N/A;  IR ANGIO VERTEBRAL SEL SUBCLAVIAN INNOMINATE UNI R MOD SED  08/07/2020  IR CT HEAD LTD  08/07/2020  IR PERCUTANEOUS ART THROMBECTOMY/INFUSION INTRACRANIAL INC DIAG ANGIO  08/07/2020  laminectomies    10/1975 and in 03/2005  RADIOLOGY WITH ANESTHESIA N/A 08/07/2020  Procedure: IR WITH ANESTHESIA;  Surgeon: Luanne Bras, MD;  Location: [facility];  Service: Radiology;  Laterality: N/A;  TONSILLECTOMY    1950  TOTAL HIP ARTHROPLASTY Right 04/03/2018  Procedure: TOTAL HIP ARTHROPLASTY ANTERIOR APPROACH;  Surgeon: Paralee Cancel, MD;  [facility];  Service: Orthopedics;  Laterality: Right;  70 mins HPI: See H&P  No data recorded Assessment / Plan / Recommendation CHL IP CLINICAL IMPRESSIONS 09/02/2020 Clinical Impression Patient remained lethargic throughout MBS but demonstrated decreased restlessness and improved sustained attention to tasks. Patient demonstrates a moderate oral dysphagia characterized by decreased lip seal  when combined with cognitive impairments resulted in hand over hand assist for self-feeding with cup and inability to utilize a straw. Prolonged AP transit with lingual pumping was also noted with all liquids and solids.  Patient with inconsistent timing and effort of swallow resulted in minimal sensed aspiration with thin liquids and mild pharyngeal residue that patient spontaneously cleared with a second swallow.  Recommend "snacks" of Dys. 1 textures with nectar-thick liquids with focus on attention and automaticity in hopes of initiating a diet quickly as mentation improves. Educated wife on results, she verbalized understanding and agreement. SLP Visit Diagnosis Dysphagia, oropharyngeal phase (R13.12) Attention and concentration deficit following -- Frontal lobe and executive function deficit following -- Impact on safety and function Moderate aspiration risk   CHL IP TREATMENT RECOMMENDATION 09/02/2020 Treatment Recommendations Therapy as outlined in treatment plan below   Prognosis 09/02/2020 Prognosis for Safe Diet Advancement Fair Barriers to Reach Goals Cognitive deficits;Severity of deficits Barriers/Prognosis Comment -- CHL IP DIET RECOMMENDATION 09/02/2020 SLP Diet Recommendations Alternative means - temporary;NPO Liquid Administration via -- Medication Administration Via alternative means Compensations -- Postural Changes --   CHL IP OTHER RECOMMENDATIONS 09/02/2020 Recommended Consults -- Oral Care Recommendations Oral care QID Other Recommendations --   CHL IP FOLLOW UP RECOMMENDATIONS 09/02/2020 Follow up Recommendations Inpatient Rehab   CHL IP FREQUENCY AND DURATION 09/02/2020 Speech Therapy Frequency (ACUTE ONLY) min  3x week Treatment Duration 2 weeks      CHL IP ORAL PHASE 09/02/2020 Oral Phase -- Oral - Pudding Teaspoon -- Oral - Pudding Cup -- Oral - Honey Teaspoon -- Oral - Honey Cup -- Oral - Nectar Teaspoon Reduced posterior propulsion;Lingual pumping;Decreased bolus cohesion;Delayed oral transit  Oral - Nectar Cup Decreased bolus cohesion;Delayed oral transit;Lingual pumping;Reduced posterior propulsion Oral - Nectar Straw -- Oral - Thin Teaspoon Reduced posterior propulsion;Delayed oral transit;Decreased bolus cohesion;Lingual pumping;Weak lingual manipulation Oral - Thin Cup -- Oral - Thin Straw -- Oral - Puree Weak lingual manipulation;Lingual pumping;Delayed oral transit Oral - Mech Soft -- Oral - Regular -- Oral - Multi-Consistency -- Oral - Pill -- Oral Phase - Comment --  CHL IP PHARYNGEAL PHASE 09/02/2020 Pharyngeal Phase Impaired Pharyngeal- Pudding Teaspoon -- Pharyngeal -- Pharyngeal- Pudding Cup -- Pharyngeal -- Pharyngeal- Honey Teaspoon -- Pharyngeal -- Pharyngeal- Honey Cup -- Pharyngeal -- Pharyngeal- Nectar Teaspoon Delayed swallow initiation-vallecula;Delayed swallow initiation-pyriform sinuses Pharyngeal -- Pharyngeal- Nectar Cup Delayed swallow initiation-vallecula;Delayed swallow initiation-pyriform sinuses;Pharyngeal residue - valleculae;Pharyngeal residue - pyriform Pharyngeal -- Pharyngeal- Nectar Straw -- Pharyngeal -- Pharyngeal- Thin Teaspoon Delayed swallow initiation-pyriform sinuses;Penetration/Aspiration during swallow Pharyngeal Material enters airway, passes BELOW cords then ejected out Pharyngeal- Thin Cup -- Pharyngeal -- Pharyngeal- Thin Straw -- Pharyngeal -- Pharyngeal- Puree Delayed swallow initiation-vallecula;Pharyngeal residue - valleculae Pharyngeal -- Pharyngeal- Mechanical Soft -- Pharyngeal -- Pharyngeal- Regular -- Pharyngeal -- Pharyngeal- Multi-consistency -- Pharyngeal -- Pharyngeal- Pill -- Pharyngeal -- Pharyngeal Comment --  No flowsheet data found. [place], [place] 09/02/2020, 12:45 PM    [place]on [place], [place], CCC-SLP [phone]           EEG adult  Result Date: 08/29/2020 Derek Jack, MD     08/29/2020  7:56 PM Routine EEG Report VITALY WANAT is a 80 y.o. 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 Report: This EEG was acquired with electrodes placed according to the International 10-20 electrode system (including Fp1, Fp2, F3, F4, C3, C4, P3, P4, O1, O2, T3, T4, T5, T6, A1, A2, Fz, Cz, Pz). The following electrodes were missing or displaced: none. The occipital dominant rhythm was 7 Hz with overriding beta frequencies. This activity is reactive to stimulation. Drowsiness was manifested by background fragmentation; deeper stages of sleep were not identified. There was focal slowing over the left temporal region. There were no interictal epileptiform discharges. There were no electrographic seizures identified. Photic stimulation and hyperventilation were not performed due to history of recent stroke. Impression and clinical correlation: This EEG was obtained while awake and drowsy and is abnormal due to mild diffuse slowing and focal slowing over the left temporal region, reflecting generalized cerebral dysfunction with focal dysfunction superimposed over the left temporal region. Beta frequencies reflect medication effect. Su Monks, MD [facility] [phone] If 7pm- 7am, please page neurology on call as listed in [place].    Assessment/Plan  1. Acute ischemic right MCA stroke ([place]) S/p thrombectomy PT ST OT  2. Traumatic brain injury, with loss of consciousness of 30 minutes or less, subsequent encounter Due to a fall   3. Traumatic subdural hemorrhage with loss of consciousness of 1 hour to 5 hours 59 minutes, sequela (HCC) Due to a fall  S/p craniotomy with SDH evacuation  4. Oropharyngeal dysphagia Continue puree diet and HTL Encourage oral hydration with hopes of removing IVF in the evening   5. Insomnia due to medical condition  Continue trazodone 100 mg qhs  6. Prediabetes CBGs BID  7. Pure hypercholesterolemia Continue lipitor 80 qd  8. Leukocytosis, unspecified type Mild and possibly due to steroid use due to cerebral edema.   9. Afib Continue  Eliquis 5 mg bid Continue tikosyn, hospital recommends frequent monitoring of K and Mg and to keep K at 4 and Mg at 2   Family/ staff Communication: discussed his care with his wife on the unit   Labs/tests ordered:   CBC BMP on Monday 7/18

## 2020-09-21 ENCOUNTER — Non-Acute Institutional Stay (SKILLED_NURSING_FACILITY): Payer: Medicare Other | Admitting: Internal Medicine

## 2020-09-21 ENCOUNTER — Encounter: Payer: Self-pay | Admitting: Internal Medicine

## 2020-09-21 DIAGNOSIS — R41841 Cognitive communication deficit: Secondary | ICD-10-CM | POA: Diagnosis not present

## 2020-09-21 DIAGNOSIS — R1312 Dysphagia, oropharyngeal phase: Secondary | ICD-10-CM

## 2020-09-21 DIAGNOSIS — I69391 Dysphagia following cerebral infarction: Secondary | ICD-10-CM | POA: Diagnosis not present

## 2020-09-21 DIAGNOSIS — R278 Other lack of coordination: Secondary | ICD-10-CM | POA: Diagnosis not present

## 2020-09-21 DIAGNOSIS — I639 Cerebral infarction, unspecified: Secondary | ICD-10-CM | POA: Diagnosis not present

## 2020-09-21 DIAGNOSIS — G4701 Insomnia due to medical condition: Secondary | ICD-10-CM | POA: Diagnosis not present

## 2020-09-21 DIAGNOSIS — I63511 Cerebral infarction due to unspecified occlusion or stenosis of right middle cerebral artery: Secondary | ICD-10-CM

## 2020-09-21 DIAGNOSIS — I482 Chronic atrial fibrillation, unspecified: Secondary | ICD-10-CM

## 2020-09-21 DIAGNOSIS — I10 Essential (primary) hypertension: Secondary | ICD-10-CM | POA: Diagnosis not present

## 2020-09-21 DIAGNOSIS — E7800 Pure hypercholesterolemia, unspecified: Secondary | ICD-10-CM

## 2020-09-21 DIAGNOSIS — I6601 Occlusion and stenosis of right middle cerebral artery: Secondary | ICD-10-CM | POA: Diagnosis not present

## 2020-09-21 DIAGNOSIS — S065X3S Traumatic subdural hemorrhage with loss of consciousness of 1 hour to 5 hours 59 minutes, sequela: Secondary | ICD-10-CM | POA: Diagnosis not present

## 2020-09-21 DIAGNOSIS — R2681 Unsteadiness on feet: Secondary | ICD-10-CM | POA: Diagnosis not present

## 2020-09-21 DIAGNOSIS — I130 Hypertensive heart and chronic kidney disease with heart failure and stage 1 through stage 4 chronic kidney disease, or unspecified chronic kidney disease: Secondary | ICD-10-CM | POA: Diagnosis not present

## 2020-09-21 DIAGNOSIS — R2689 Other abnormalities of gait and mobility: Secondary | ICD-10-CM | POA: Diagnosis not present

## 2020-09-21 DIAGNOSIS — M62562 Muscle wasting and atrophy, not elsewhere classified, left lower leg: Secondary | ICD-10-CM | POA: Diagnosis not present

## 2020-09-21 DIAGNOSIS — M62561 Muscle wasting and atrophy, not elsewhere classified, right lower leg: Secondary | ICD-10-CM | POA: Diagnosis not present

## 2020-09-21 LAB — BASIC METABOLIC PANEL
BUN: 18 (ref 4–21)
CO2: 23 — AB (ref 13–22)
Chloride: 105 (ref 99–108)
Creatinine: 0.7 (ref 0.6–1.3)
Glucose: 99
Potassium: 4.1 (ref 3.4–5.3)
Sodium: 139 (ref 137–147)

## 2020-09-21 LAB — MAGNESIUM: Magnesium: 2

## 2020-09-21 LAB — COMPREHENSIVE METABOLIC PANEL: Calcium: 9.2 (ref 8.7–10.7)

## 2020-09-21 MED ORDER — LORAZEPAM 0.5 MG PO TABS: 0.5000 mg | ORAL_TABLET | Freq: Two times a day (BID) | ORAL | 1 refills | Status: AC | PRN

## 2020-09-21 NOTE — Progress Notes (Signed)
Provider:  Veleta Miners MD  Location:    [facility] Room Number: 562 Place of Service:  SNF ([phone])  PCP: [name], MD Patient Care Team: [name], MD as PCP - General (Internal Medicine) [name], MD as Consulting Physician (Ophthalmology) [name], MD (Internal Medicine)  Extended Emergency Contact Information Primary Emergency Contact: Duffin,Agnes S Address: [place], [place] [address] [name] of [place] Phone: [phone] Relation: Spouse Secondary Emergency Contact: [name], [place] [address] [name] of [place] Phone: [phone] Relation: Son  Code Status: Full Code  Goals of Care: Advanced Directive information Advanced Directives 09/18/2020  Does Patient Have a Medical Advance Directive? No  Type of Advance Directive -  Does patient want to make changes to medical advance directive? -  Copy of [place] in Chart? -  Would patient like information on creating a medical advance directive? No - Patient declined  Pre-existing out of facility DNR order (yellow form or pink MOST form) -      Chief Complaint  Patient presents with   New Admit To SNF    Admission to SNF    HPI: Patient is a 80 y.o. male seen today for admission to  Rehab  He was in Inpatient Rehab from 06/11-07/14 for Acute Ischemic Right MCA Stroke  Also in the hospital from 05/23-05/27 for Right Subdural Hematoma s/p Right Craniotomy 06/3 -6/11 Patient had Acute Ischemic Stroke Underwent Endovascular Revascularization of ICA Followed by Intubation and Hypertonic Saline infusion   Patient also has PMH of Atrial Fibrillation, s/p AVR and MVR Aortic Aneurysm, Diastolic CHF,   Patient fell in [place] on 5/22 Came back to ED after sustaining Large Subdural Hematoma with Neurological deficits. He improved and Underwent Craniotomy on 5/24 Underwent Aggressive  Therapy Unfortunately had Acute Right MCA stroke on 06/03 as he was off Coumadin. Underwent Revascularization by IR. Followed by Intubation and Hypertonic Saline Had Inpatient rehab. Now in [facility] for Further Rehab  Seen in his room with Wife Has been having some Anxiety and Restlessness Also has been having issues with Constipation Appetite is very good. Getting IV fluids at night Does have Moderate Dysphagia needing D1 Honey Thick Fluid Today patient was very alert awake. Responding  Still has Left Sided neglect. Also has Aphasia but was able to conversing. Was very active before this .Has 2 sons.       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    Other and unspecified hyperlipidemia    Pre-diabetes    Previous back surgery 1978, jan 2007   Psychosexual dysfunction with inhibited sexual excitement    SOB (shortness of breath)    "with heavy exercise"   Stroke ([place]) 08/2013   "I WAS  IN AFIB AND THREW A CLOT, THE EFFECTS WERE TRANSITORY AND DIDNT LAST BUT FOR 30 MINUTES"    Thoracic aortic aneurysm [facility])    Past Surgical History:  Procedure Laterality Date   AORTIC AND MITRAL VALVE REPLACEMENT     09/2004   CARDIOVERSION     3 times from 2004-2006   CARDIOVERSION N/A 09/26/2013   Procedure: CARDIOVERSION;  Surgeon: Larey Dresser, MD;  Location: [facility];  Service: Cardiovascular;  Laterality: N/A;   CARDIOVERSION N/A 06/19/2014   Procedure: CARDIOVERSION;  Surgeon: Jerline Pain, MD;   Location: [facility];  Service: Cardiovascular;  Laterality: N/A;   CARDIOVERSION N/A 04/24/2017   Procedure: CARDIOVERSION;  Surgeon: Larey Dresser, MD;  Location: [facility];  Service: Cardiovascular;  Laterality: N/A;   CARDIOVERSION N/A 06/08/2017   Procedure: CARDIOVERSION;  Surgeon: Pixie Casino, MD;  Location: [facility] ENDOSCOPY;  Service: Cardiovascular;  Laterality: N/A;   CARDIOVERSION N/A 08/24/2017   Procedure: CARDIOVERSION;  Surgeon: Skeet Latch, MD;  Location: [facility] ENDOSCOPY;  Service: Cardiovascular;  Laterality: N/A;   CARDIOVERSION N/A 02/14/2018   Procedure: CARDIOVERSION;  Surgeon: Larey Dresser, MD;  Location: [facility] ENDOSCOPY;  Service: Cardiovascular;  Laterality: N/A;   COLONOSCOPY     CRANIOTOMY N/A 07/28/2020   Procedure: CRANIOTOMY FOR EVACUATION OF SUBDURAL HEMATOMA;  Surgeon: Eustace Shishido, MD;  Location: [facility];  Service: Neurosurgery;  Laterality: N/A;   IR ANGIO VERTEBRAL SEL SUBCLAVIAN INNOMINATE UNI R MOD SED  08/07/2020   IR CT HEAD LTD  08/07/2020   IR PERCUTANEOUS ART THROMBECTOMY/INFUSION INTRACRANIAL INC DIAG ANGIO  08/07/2020   laminectomies     10/1975 and in 03/2005   RADIOLOGY WITH ANESTHESIA N/A 08/07/2020   Procedure: IR WITH ANESTHESIA;  Surgeon: Luanne Bras, MD;  Location: [facility];  Service: Radiology;  Laterality: N/A;   TONSILLECTOMY     1950   TOTAL HIP ARTHROPLASTY Right 04/03/2018   Procedure: TOTAL HIP ARTHROPLASTY ANTERIOR APPROACH;  Surgeon: Paralee Cancel, MD;  [facility];  Service: Orthopedics;  Laterality: Right;  70 mins    reports that he has never smoked. He has never used smokeless tobacco. He reports that he does not drink alcohol and does not use drugs. Social History   Socioeconomic History   Marital status: Married    Spouse name: Not on file   Number of children: 2   Years of education: BS   Highest education level: Not on file  Occupational History   Occupation: retired  Tobacco Use   Smoking status: Never    Smokeless tobacco: Never  Vaping Use   Vaping Use: Never used  Substance and Sexual Activity   Alcohol use: Never    Comment: wine occasionally   Drug use: Never   Sexual activity: Yes    Partners: Female  Other Topics Concern   Not on file  Social History Narrative   ** Merged History Encounter **       [place] - BS, JD. married - '67. 2 sons - '74, '77  one son is a Nurse, learning disability for [employer]; 4 grandchildren. 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 marriage in good hea   lth. End-of-life: discussed issues and provided packet with the MOST form and out of facility order.   Social Determinants of Health   Financial Resource Strain: Not on file  Food Insecurity: Not on file  Transportation Needs: Not on file  Physical Activity: Not on file  Stress: Not on file  Social Connections: Not on file  Intimate Partner Violence: Not on file    Functional Status Survey:    Family History  Problem Relation Age of Onset   Macular degeneration Mother        macular degeneration   Cancer Father        intestinal/GI   Diabetes Paternal Aunt    Heart attack Maternal Grandfather    Diabetes Brother     Health Maintenance  Topic Date Due   Hepatitis C Screening  Never done   Zoster Vaccines- Shingrix (2 of 2) 09/10/2019   INFLUENZA VACCINE  10/05/2020   COVID-19 Vaccine (5 - Booster for Moderna series) 10/26/2020   TETANUS/TDAP  03/22/2028   PNA vac Low Risk Adult  Completed   HPV VACCINES  Aged Out    Allergies  Allergen Reactions   Naproxen Hives   No [facility] Food Allergies Other (See Comments)    Scallops - distress, nausea and vomitting    Allergies as of 09/21/2020       Reactions   Naproxen Hives   No [facility] Food Allergies Other (See Comments)   Scallops - distress, nausea and vomitting        Medication List        Accurate as of September 21, 2020 10:43 AM. If you  have any questions, ask your nurse or doctor.          acetaminophen 325 MG tablet Commonly known as: TYLENOL Take 1-2 tablets (325-650 mg total) by mouth every 4 (four) hours as needed for mild pain.   amantadine 50 MG/5ML solution Commonly known as: SYMMETREL Take 10 mLs (100 mg total) by mouth 2 (two) times daily.   atorvastatin 80 MG tablet Commonly known as: LIPITOR Take 1 tablet (80 mg total) by mouth daily.   bisacodyl 10 MG suppository Commonly known as: DULCOLAX Place 10 mg rectally as needed for moderate constipation.   clobetasol cream 0.05 % Commonly known as: TEMOVATE Apply 1 application topically daily as needed (for skin irritation).   dofetilide 500 MCG capsule Commonly known as: TIKOSYN Take 1 capsule (500 mcg total) by mouth 2 (two) times daily.   doxazosin 2 MG tablet Commonly known as: CARDURA Take 1 tablet (2 mg total) by mouth daily.   Eliquis 5 MG Tabs tablet Generic drug: apixaban Take 1 tablet (5 mg total) by mouth 2 (two) times daily.   hydrocortisone cream 1 % Apply topically 2 (two) times daily as needed for itching (rash).   levETIRAcetam 500 MG tablet Commonly known as: KEPPRA Take 1 tablet (500 mg total) by mouth 2 (two) times daily.   losartan 25 MG tablet Commonly known as: COZAAR Place 1 tablet (25 mg total) into feeding tube daily.   magnesium gluconate 500 MG tablet Commonly known as: MAGONATE Take 500 mg by mouth daily.   metoprolol tartrate 25 MG tablet Commonly known as: LOPRESSOR Take 1 tablet (25 mg total) by mouth 2 (two) times daily.   pantoprazole sodium 40 mg/20 mL Pack Commonly known as: PROTONIX Take 20 mLs (40 mg total) by mouth daily.   polyvinyl alcohol 1.4 % ophthalmic solution Commonly known as: LIQUIFILM TEARS Place 1 drop into both eyes 2 (two) times daily.   potassium chloride 20 MEQ packet Commonly known as: KLOR-CON Take 40 mEq by mouth 2 (two) times daily.   senna-docusate 8.6-50 MG  tablet Commonly known as: Senokot-S Take 1  tablet by mouth at bedtime as needed for moderate constipation.   sodium chloride 0.45 % solution Administer IV fluids from 6 pm to 6 am daily till fluid intake improves/diet advanced.   traZODone 100 MG tablet Commonly known as: DESYREL Take 1 tablet (100 mg total) by mouth at bedtime.        Review of Systems Unable to get more history from patient   Vitals:   09/21/20 1032  BP: 121/78  Pulse: 83  Resp: 20  Temp: 97.7 F (36.5 C)  SpO2: 95%  Weight: 189 lb 9.6 oz (86 kg)  Height: 5\' 7"  (1.702 m)   Body mass index is 29.7 kg/m. Physical Exam Constitutional:  Well-developed and well-nourished.  HENT:  Head: Normocephalic.  Mouth/Throat: Oropharynx is clear and moist.  Eyes: Pupils are equal, round, and reactive to light.  Neck: Neck supple.  Cardiovascular: Normal rate and normal heart sounds.  No murmur heard. Pulmonary/Chest: Effort normal and breath sounds normal. No respiratory distress. No wheezes. She has no rales.  Abdominal: Soft. Bowel sounds are normal. No distension. There is no tenderness. There is no rebound.  Musculoskeletal: No edema.  Lymphadenopathy: none Neurological: Alert Does get distracted  But was more responsive Left sided Neglect  Aphasia Present Right 4/5 strength Left 3-4/5 Strength  Skin: Skin is warm and dry.  Psychiatric: Normal mood and affect. Behavior is normal. Thought content normal.   Labs reviewed: Basic Metabolic Panel: Recent Labs    08/09/20 1611 08/09/20 2158 08/12/20 2154 08/13/20 0513 08/13/20 1200 08/14/20 0414 08/31/20 0410 09/08/20 0344 09/11/20 0402 09/15/20 0432 09/17/20 0436  NA 140   < > 152*   < >  --    < > 139 138 141 140 140  K  --    < > 3.3*   < >  --    < > 4.2 3.9 4.0 3.9 4.1  CL  --    < > 122*   < >  --    < > 105 106 109 107 107  CO2  --    < > 26   < >  --    < > 28 27 27 27 26   GLUCOSE  --    < > 174*   < >  --    < > 189* 156* 109* 107*  128*  BUN  --    < > 33*   < >  --    < > 19 28* 27* 20 23  CREATININE  --    < > 0.73   < >  --    < > 0.84 0.92 0.85 0.79 0.85  CALCIUM  --    < > 7.8*   < >  --    < > 8.8* 8.9 8.7* 9.1 9.1  MG 2.0   < > 2.3   < >  --    < > 2.0 2.0  --  2.0  --   PHOS 2.7  --  1.9*  --  2.2*  --   --   --   --   --   --    < > = values in this interval not displayed.   Liver Function Tests: Recent Labs    07/27/20 1833 08/01/20 0610 08/16/20 0427  AST 34 39 74*  ALT 33 52* 100*  ALKPHOS 65 46 63  BILITOT 0.9 1.1 0.8  PROT 7.0 5.9* 5.2*  ALBUMIN 3.8 2.8* 2.2*   No  results for input(s): LIPASE, AMYLASE in the last 8760 hours. No results for input(s): AMMONIA in the last 8760 hours. CBC: Recent Labs    08/08/20 0625 08/09/20 0451 08/16/20 0427 08/17/20 0340 09/08/20 0344 09/11/20 0402 09/15/20 0432  WBC 17.5* 16.4* 7.9   < > 11.8* 10.8* 11.7*  NEUTROABS 13.5* 12.6* 5.7  --   --   --   --   HGB 12.3* 11.6* 9.0*   < > 11.6* 11.3* 11.8*  HCT 36.7* 34.6* 28.2*   < > 35.8* 35.0* 36.3*  MCV 94.1 93.5 98.6   < > 95.7 94.9 94.3  PLT 254 229 177   < > 197 189 188   < > = values in this interval not displayed.   Cardiac Enzymes: No results for input(s): CKTOTAL, CKMB, CKMBINDEX, TROPONINI in the last 8760 hours. BNP: Invalid input(s): POCBNP Lab Results  Component Value Date   HGBA1C 6.2 (H) 08/08/2020   Lab Results  Component Value Date   TSH 2.82 05/22/2017   Lab Results  Component Value Date   VITAMINB12 3,210 (H) 08/13/2020   Lab Results  Component Value Date   FOLATE 22.5 05/19/2016   Lab Results  Component Value Date   IRON 71 05/19/2016   FERRITIN 226.4 05/19/2016    Imaging and Procedures obtained prior to SNF admission: No results found.  Assessment/Plan Acute ischemic right MCA stroke (HCC) On Eliquis Also on Statin BP controlled Patient on Amantadine for Dyskinesis Also on Keprra for Seizures Prophylaxis Working with therapy   Oropharyngeal  dysphagia On Honey Thick Diet Also getting IV fluids at night for Hydration Following Labs Closely  Insomnia due to medical condition On Trazadone Pure hypercholesterolemia Continue Statin Atrial fibrillation, chronic (HCC) On Tilosyn and Eliquis now Hypertension On Cozaar and Cardura and Lopressor Anxiety Will write for Ativan 0.5 mg BID Prn H/o BPH Was on Flomax and Proscar before Not on any meds right now Constipation Start on Senna and Miralax PRN  Family/ staff Communication:   Labs/tests ordered:

## 2020-09-22 ENCOUNTER — Telehealth (HOSPITAL_COMMUNITY): Payer: Self-pay

## 2020-09-22 ENCOUNTER — Other Ambulatory Visit (HOSPITAL_COMMUNITY): Payer: Self-pay

## 2020-09-22 DIAGNOSIS — Z9181 History of falling: Secondary | ICD-10-CM | POA: Diagnosis not present

## 2020-09-22 DIAGNOSIS — R41841 Cognitive communication deficit: Secondary | ICD-10-CM | POA: Diagnosis not present

## 2020-09-22 DIAGNOSIS — H538 Other visual disturbances: Secondary | ICD-10-CM | POA: Diagnosis not present

## 2020-09-22 DIAGNOSIS — H53452 Other localized visual field defect, left eye: Secondary | ICD-10-CM | POA: Diagnosis not present

## 2020-09-22 DIAGNOSIS — M542 Cervicalgia: Secondary | ICD-10-CM | POA: Diagnosis not present

## 2020-09-22 DIAGNOSIS — R2689 Other abnormalities of gait and mobility: Secondary | ICD-10-CM | POA: Diagnosis not present

## 2020-09-22 DIAGNOSIS — R293 Abnormal posture: Secondary | ICD-10-CM | POA: Diagnosis not present

## 2020-09-22 DIAGNOSIS — M6389 Disorders of muscle in diseases classified elsewhere, multiple sites: Secondary | ICD-10-CM | POA: Diagnosis not present

## 2020-09-22 DIAGNOSIS — R278 Other lack of coordination: Secondary | ICD-10-CM | POA: Diagnosis not present

## 2020-09-22 DIAGNOSIS — M62562 Muscle wasting and atrophy, not elsewhere classified, left lower leg: Secondary | ICD-10-CM | POA: Diagnosis not present

## 2020-09-22 DIAGNOSIS — I6601 Occlusion and stenosis of right middle cerebral artery: Secondary | ICD-10-CM | POA: Diagnosis not present

## 2020-09-22 DIAGNOSIS — R2681 Unsteadiness on feet: Secondary | ICD-10-CM | POA: Diagnosis not present

## 2020-09-22 DIAGNOSIS — R41840 Attention and concentration deficit: Secondary | ICD-10-CM | POA: Diagnosis not present

## 2020-09-22 DIAGNOSIS — N3946 Mixed incontinence: Secondary | ICD-10-CM | POA: Diagnosis not present

## 2020-09-22 DIAGNOSIS — S065X3S Traumatic subdural hemorrhage with loss of consciousness of 1 hour to 5 hours 59 minutes, sequela: Secondary | ICD-10-CM | POA: Diagnosis not present

## 2020-09-22 DIAGNOSIS — M62561 Muscle wasting and atrophy, not elsewhere classified, right lower leg: Secondary | ICD-10-CM | POA: Diagnosis not present

## 2020-09-22 DIAGNOSIS — I10 Essential (primary) hypertension: Secondary | ICD-10-CM | POA: Diagnosis not present

## 2020-09-22 DIAGNOSIS — I69391 Dysphagia following cerebral infarction: Secondary | ICD-10-CM | POA: Diagnosis not present

## 2020-09-22 NOTE — Telephone Encounter (Signed)
Pharmacy Transitions of Care Follow-up Telephone Call  Date of discharge: 09/17/20  Discharge Diagnosis: middle cerebral artery embolism  How have you been since you were released from the hospital?  Spoke with wife Herbert Pun. Patient at [facility] and being followed by doctors and staff on site.  Medication changes made at discharge: START: acetaminophen (TYLENOL)  amantadine (SYMMETREL)  Eliquis (apixaban)  hydrocortisone cream  levETIRAcetam (KEPPRA)  metoprolol tartrate (LOPRESSOR)  polyvinyl alcohol (LIQUIFILM TEARS)  potassium chloride (KLOR-CON)  sodium chloride  traZODone (DESYREL)   CHANGE: atorvastatin (LIPITOR)  doxazosin (CARDURA)  pantoprazole sodium (PROTONIX)  senna-docusate (Senokot-S)   STOP: amantadine 100 MG capsule (SYMMETREL)  Replaced by a similar medication. aspirin 325 MG tablet  enoxaparin 40 MG/0.4ML injection (LOVENOX)  feeding supplement (OSMOLITE 1.5 CAL) Liqd  free water Soln  metoprolol tartrate 25 mg/10 mL Susp (LOPRESSOR)  polyethylene glycol 17 g packet (MIRALAX / GLYCOLAX)   Medication changes verified by the patient?  Yes    Medication Accessibility:  Home Pharmacy:  [facility]  Was the patient provided with refills on discharged medications? No   Have all prescriptions been transferred from [facility] to home pharmacy? N/A   Is the patient able to afford medications? Patient has [employer] Gold Plus    Medication Review:  APIXABAN (ELIQUIS)  Apixaban 5 mg BID initiated on 09/16/20. - Discussed importance of taking medication around the same time everyday  - Advised patient of medications to avoid (NSAIDs, ASA)  - Educated that Tylenol (acetaminophen) will be the preferred analgesic to prevent risk of bleeding  - Emphasized importance of monitoring for signs and symptoms of bleeding (abnormal bruising, prolonged bleeding, nose bleeds, bleeding from gums, discolored urine, black tarry stools)  - Advised patient to alert  all providers of anticoagulation therapy prior to starting a new medication or having a procedure    Follow-up Appointments:  PCP Hospital f/u appt confirmed? None currently scheduled. Will schedule with cardiologist in the next month.  [facility] f/u appt confirmed? Yes Scheduled to see Dr. Darden Dates on 10/27/20 @ Neurology.   If their condition worsens, is the pt aware to call PCP or go to the Emergency Dept.? Yes  Final Patient Assessment: Patient being followed by nursing staff, has refills at home pharmacy and follow up scheduled.

## 2020-09-23 DIAGNOSIS — N3946 Mixed incontinence: Secondary | ICD-10-CM | POA: Diagnosis not present

## 2020-09-23 DIAGNOSIS — M6389 Disorders of muscle in diseases classified elsewhere, multiple sites: Secondary | ICD-10-CM | POA: Diagnosis not present

## 2020-09-23 DIAGNOSIS — H53452 Other localized visual field defect, left eye: Secondary | ICD-10-CM | POA: Diagnosis not present

## 2020-09-23 DIAGNOSIS — I6601 Occlusion and stenosis of right middle cerebral artery: Secondary | ICD-10-CM | POA: Diagnosis not present

## 2020-09-23 DIAGNOSIS — R41840 Attention and concentration deficit: Secondary | ICD-10-CM | POA: Diagnosis not present

## 2020-09-23 DIAGNOSIS — R2689 Other abnormalities of gait and mobility: Secondary | ICD-10-CM | POA: Diagnosis not present

## 2020-09-23 DIAGNOSIS — I69391 Dysphagia following cerebral infarction: Secondary | ICD-10-CM | POA: Diagnosis not present

## 2020-09-23 DIAGNOSIS — M62562 Muscle wasting and atrophy, not elsewhere classified, left lower leg: Secondary | ICD-10-CM | POA: Diagnosis not present

## 2020-09-23 DIAGNOSIS — R293 Abnormal posture: Secondary | ICD-10-CM | POA: Diagnosis not present

## 2020-09-23 DIAGNOSIS — I10 Essential (primary) hypertension: Secondary | ICD-10-CM | POA: Diagnosis not present

## 2020-09-23 DIAGNOSIS — Z9181 History of falling: Secondary | ICD-10-CM | POA: Diagnosis not present

## 2020-09-23 DIAGNOSIS — M62561 Muscle wasting and atrophy, not elsewhere classified, right lower leg: Secondary | ICD-10-CM | POA: Diagnosis not present

## 2020-09-23 DIAGNOSIS — R41841 Cognitive communication deficit: Secondary | ICD-10-CM | POA: Diagnosis not present

## 2020-09-23 DIAGNOSIS — M542 Cervicalgia: Secondary | ICD-10-CM | POA: Diagnosis not present

## 2020-09-23 DIAGNOSIS — R278 Other lack of coordination: Secondary | ICD-10-CM | POA: Diagnosis not present

## 2020-09-23 DIAGNOSIS — R2681 Unsteadiness on feet: Secondary | ICD-10-CM | POA: Diagnosis not present

## 2020-09-23 DIAGNOSIS — S065X3S Traumatic subdural hemorrhage with loss of consciousness of 1 hour to 5 hours 59 minutes, sequela: Secondary | ICD-10-CM | POA: Diagnosis not present

## 2020-09-23 DIAGNOSIS — H538 Other visual disturbances: Secondary | ICD-10-CM | POA: Diagnosis not present

## 2020-09-24 DIAGNOSIS — R278 Other lack of coordination: Secondary | ICD-10-CM | POA: Diagnosis not present

## 2020-09-24 DIAGNOSIS — I69391 Dysphagia following cerebral infarction: Secondary | ICD-10-CM | POA: Diagnosis not present

## 2020-09-24 DIAGNOSIS — M62562 Muscle wasting and atrophy, not elsewhere classified, left lower leg: Secondary | ICD-10-CM | POA: Diagnosis not present

## 2020-09-24 DIAGNOSIS — Z9181 History of falling: Secondary | ICD-10-CM | POA: Diagnosis not present

## 2020-09-24 DIAGNOSIS — H538 Other visual disturbances: Secondary | ICD-10-CM | POA: Diagnosis not present

## 2020-09-24 DIAGNOSIS — I6601 Occlusion and stenosis of right middle cerebral artery: Secondary | ICD-10-CM | POA: Diagnosis not present

## 2020-09-24 DIAGNOSIS — N3946 Mixed incontinence: Secondary | ICD-10-CM | POA: Diagnosis not present

## 2020-09-24 DIAGNOSIS — R2681 Unsteadiness on feet: Secondary | ICD-10-CM | POA: Diagnosis not present

## 2020-09-24 DIAGNOSIS — R41841 Cognitive communication deficit: Secondary | ICD-10-CM | POA: Diagnosis not present

## 2020-09-24 DIAGNOSIS — M62561 Muscle wasting and atrophy, not elsewhere classified, right lower leg: Secondary | ICD-10-CM | POA: Diagnosis not present

## 2020-09-24 DIAGNOSIS — I10 Essential (primary) hypertension: Secondary | ICD-10-CM | POA: Diagnosis not present

## 2020-09-24 DIAGNOSIS — M6389 Disorders of muscle in diseases classified elsewhere, multiple sites: Secondary | ICD-10-CM | POA: Diagnosis not present

## 2020-09-24 DIAGNOSIS — R293 Abnormal posture: Secondary | ICD-10-CM | POA: Diagnosis not present

## 2020-09-24 DIAGNOSIS — H53452 Other localized visual field defect, left eye: Secondary | ICD-10-CM | POA: Diagnosis not present

## 2020-09-24 DIAGNOSIS — R41840 Attention and concentration deficit: Secondary | ICD-10-CM | POA: Diagnosis not present

## 2020-09-24 DIAGNOSIS — M542 Cervicalgia: Secondary | ICD-10-CM | POA: Diagnosis not present

## 2020-09-24 DIAGNOSIS — R2689 Other abnormalities of gait and mobility: Secondary | ICD-10-CM | POA: Diagnosis not present

## 2020-09-24 DIAGNOSIS — S065X3S Traumatic subdural hemorrhage with loss of consciousness of 1 hour to 5 hours 59 minutes, sequela: Secondary | ICD-10-CM | POA: Diagnosis not present

## 2020-09-25 DIAGNOSIS — M62562 Muscle wasting and atrophy, not elsewhere classified, left lower leg: Secondary | ICD-10-CM | POA: Diagnosis not present

## 2020-09-25 DIAGNOSIS — N3946 Mixed incontinence: Secondary | ICD-10-CM | POA: Diagnosis not present

## 2020-09-25 DIAGNOSIS — M6389 Disorders of muscle in diseases classified elsewhere, multiple sites: Secondary | ICD-10-CM | POA: Diagnosis not present

## 2020-09-25 DIAGNOSIS — R278 Other lack of coordination: Secondary | ICD-10-CM | POA: Diagnosis not present

## 2020-09-25 DIAGNOSIS — I6601 Occlusion and stenosis of right middle cerebral artery: Secondary | ICD-10-CM | POA: Diagnosis not present

## 2020-09-25 DIAGNOSIS — H53452 Other localized visual field defect, left eye: Secondary | ICD-10-CM | POA: Diagnosis not present

## 2020-09-25 DIAGNOSIS — R2681 Unsteadiness on feet: Secondary | ICD-10-CM | POA: Diagnosis not present

## 2020-09-25 DIAGNOSIS — H538 Other visual disturbances: Secondary | ICD-10-CM | POA: Diagnosis not present

## 2020-09-25 DIAGNOSIS — M62561 Muscle wasting and atrophy, not elsewhere classified, right lower leg: Secondary | ICD-10-CM | POA: Diagnosis not present

## 2020-09-25 DIAGNOSIS — R2689 Other abnormalities of gait and mobility: Secondary | ICD-10-CM | POA: Diagnosis not present

## 2020-09-25 DIAGNOSIS — R41841 Cognitive communication deficit: Secondary | ICD-10-CM | POA: Diagnosis not present

## 2020-09-25 DIAGNOSIS — M542 Cervicalgia: Secondary | ICD-10-CM | POA: Diagnosis not present

## 2020-09-25 DIAGNOSIS — I10 Essential (primary) hypertension: Secondary | ICD-10-CM | POA: Diagnosis not present

## 2020-09-25 DIAGNOSIS — Z9181 History of falling: Secondary | ICD-10-CM | POA: Diagnosis not present

## 2020-09-25 DIAGNOSIS — I69391 Dysphagia following cerebral infarction: Secondary | ICD-10-CM | POA: Diagnosis not present

## 2020-09-25 DIAGNOSIS — R293 Abnormal posture: Secondary | ICD-10-CM | POA: Diagnosis not present

## 2020-09-25 DIAGNOSIS — S065X3S Traumatic subdural hemorrhage with loss of consciousness of 1 hour to 5 hours 59 minutes, sequela: Secondary | ICD-10-CM | POA: Diagnosis not present

## 2020-09-25 DIAGNOSIS — R41840 Attention and concentration deficit: Secondary | ICD-10-CM | POA: Diagnosis not present

## 2020-09-28 DIAGNOSIS — H53452 Other localized visual field defect, left eye: Secondary | ICD-10-CM | POA: Diagnosis not present

## 2020-09-28 DIAGNOSIS — M542 Cervicalgia: Secondary | ICD-10-CM | POA: Diagnosis not present

## 2020-09-28 DIAGNOSIS — M62561 Muscle wasting and atrophy, not elsewhere classified, right lower leg: Secondary | ICD-10-CM | POA: Diagnosis not present

## 2020-09-28 DIAGNOSIS — R2689 Other abnormalities of gait and mobility: Secondary | ICD-10-CM | POA: Diagnosis not present

## 2020-09-28 DIAGNOSIS — N3946 Mixed incontinence: Secondary | ICD-10-CM | POA: Diagnosis not present

## 2020-09-28 DIAGNOSIS — M62562 Muscle wasting and atrophy, not elsewhere classified, left lower leg: Secondary | ICD-10-CM | POA: Diagnosis not present

## 2020-09-28 DIAGNOSIS — S065X3S Traumatic subdural hemorrhage with loss of consciousness of 1 hour to 5 hours 59 minutes, sequela: Secondary | ICD-10-CM | POA: Diagnosis not present

## 2020-09-28 DIAGNOSIS — I10 Essential (primary) hypertension: Secondary | ICD-10-CM | POA: Diagnosis not present

## 2020-09-28 DIAGNOSIS — R2681 Unsteadiness on feet: Secondary | ICD-10-CM | POA: Diagnosis not present

## 2020-09-28 DIAGNOSIS — R293 Abnormal posture: Secondary | ICD-10-CM | POA: Diagnosis not present

## 2020-09-28 DIAGNOSIS — I69391 Dysphagia following cerebral infarction: Secondary | ICD-10-CM | POA: Diagnosis not present

## 2020-09-28 DIAGNOSIS — Z9181 History of falling: Secondary | ICD-10-CM | POA: Diagnosis not present

## 2020-09-28 DIAGNOSIS — H538 Other visual disturbances: Secondary | ICD-10-CM | POA: Diagnosis not present

## 2020-09-28 DIAGNOSIS — I6601 Occlusion and stenosis of right middle cerebral artery: Secondary | ICD-10-CM | POA: Diagnosis not present

## 2020-09-28 DIAGNOSIS — R41840 Attention and concentration deficit: Secondary | ICD-10-CM | POA: Diagnosis not present

## 2020-09-28 DIAGNOSIS — R41841 Cognitive communication deficit: Secondary | ICD-10-CM | POA: Diagnosis not present

## 2020-09-28 DIAGNOSIS — R278 Other lack of coordination: Secondary | ICD-10-CM | POA: Diagnosis not present

## 2020-09-28 DIAGNOSIS — M6389 Disorders of muscle in diseases classified elsewhere, multiple sites: Secondary | ICD-10-CM | POA: Diagnosis not present

## 2020-09-29 DIAGNOSIS — R2681 Unsteadiness on feet: Secondary | ICD-10-CM | POA: Diagnosis not present

## 2020-09-29 DIAGNOSIS — H538 Other visual disturbances: Secondary | ICD-10-CM | POA: Diagnosis not present

## 2020-09-29 DIAGNOSIS — M542 Cervicalgia: Secondary | ICD-10-CM | POA: Diagnosis not present

## 2020-09-29 DIAGNOSIS — I6601 Occlusion and stenosis of right middle cerebral artery: Secondary | ICD-10-CM | POA: Diagnosis not present

## 2020-09-29 DIAGNOSIS — R2689 Other abnormalities of gait and mobility: Secondary | ICD-10-CM | POA: Diagnosis not present

## 2020-09-29 DIAGNOSIS — R293 Abnormal posture: Secondary | ICD-10-CM | POA: Diagnosis not present

## 2020-09-29 DIAGNOSIS — Z9181 History of falling: Secondary | ICD-10-CM | POA: Diagnosis not present

## 2020-09-29 DIAGNOSIS — R41841 Cognitive communication deficit: Secondary | ICD-10-CM | POA: Diagnosis not present

## 2020-09-29 DIAGNOSIS — R41840 Attention and concentration deficit: Secondary | ICD-10-CM | POA: Diagnosis not present

## 2020-09-29 DIAGNOSIS — N3946 Mixed incontinence: Secondary | ICD-10-CM | POA: Diagnosis not present

## 2020-09-29 DIAGNOSIS — M62561 Muscle wasting and atrophy, not elsewhere classified, right lower leg: Secondary | ICD-10-CM | POA: Diagnosis not present

## 2020-09-29 DIAGNOSIS — M62562 Muscle wasting and atrophy, not elsewhere classified, left lower leg: Secondary | ICD-10-CM | POA: Diagnosis not present

## 2020-09-29 DIAGNOSIS — H53452 Other localized visual field defect, left eye: Secondary | ICD-10-CM | POA: Diagnosis not present

## 2020-09-29 DIAGNOSIS — M6389 Disorders of muscle in diseases classified elsewhere, multiple sites: Secondary | ICD-10-CM | POA: Diagnosis not present

## 2020-09-29 DIAGNOSIS — R278 Other lack of coordination: Secondary | ICD-10-CM | POA: Diagnosis not present

## 2020-09-29 DIAGNOSIS — I10 Essential (primary) hypertension: Secondary | ICD-10-CM | POA: Diagnosis not present

## 2020-09-29 DIAGNOSIS — I69391 Dysphagia following cerebral infarction: Secondary | ICD-10-CM | POA: Diagnosis not present

## 2020-09-29 DIAGNOSIS — S065X3S Traumatic subdural hemorrhage with loss of consciousness of 1 hour to 5 hours 59 minutes, sequela: Secondary | ICD-10-CM | POA: Diagnosis not present

## 2020-09-29 LAB — BASIC METABOLIC PANEL
BUN: 19 (ref 4–21)
CO2: 23 — AB (ref 13–22)
Chloride: 108 (ref 99–108)
Creatinine: 0.9 (ref 0.6–1.3)
Glucose: 112
Potassium: 4.2 (ref 3.4–5.3)
Sodium: 145 (ref 137–147)

## 2020-09-29 LAB — COMPREHENSIVE METABOLIC PANEL: Calcium: 9.3 (ref 8.7–10.7)

## 2020-09-30 DIAGNOSIS — I10 Essential (primary) hypertension: Secondary | ICD-10-CM | POA: Diagnosis not present

## 2020-09-30 DIAGNOSIS — M62561 Muscle wasting and atrophy, not elsewhere classified, right lower leg: Secondary | ICD-10-CM | POA: Diagnosis not present

## 2020-09-30 DIAGNOSIS — I69391 Dysphagia following cerebral infarction: Secondary | ICD-10-CM | POA: Diagnosis not present

## 2020-09-30 DIAGNOSIS — I6601 Occlusion and stenosis of right middle cerebral artery: Secondary | ICD-10-CM | POA: Diagnosis not present

## 2020-09-30 DIAGNOSIS — N3946 Mixed incontinence: Secondary | ICD-10-CM | POA: Diagnosis not present

## 2020-09-30 DIAGNOSIS — M542 Cervicalgia: Secondary | ICD-10-CM | POA: Diagnosis not present

## 2020-09-30 DIAGNOSIS — R2681 Unsteadiness on feet: Secondary | ICD-10-CM | POA: Diagnosis not present

## 2020-09-30 DIAGNOSIS — R41840 Attention and concentration deficit: Secondary | ICD-10-CM | POA: Diagnosis not present

## 2020-09-30 DIAGNOSIS — M62562 Muscle wasting and atrophy, not elsewhere classified, left lower leg: Secondary | ICD-10-CM | POA: Diagnosis not present

## 2020-09-30 DIAGNOSIS — R2689 Other abnormalities of gait and mobility: Secondary | ICD-10-CM | POA: Diagnosis not present

## 2020-09-30 DIAGNOSIS — R278 Other lack of coordination: Secondary | ICD-10-CM | POA: Diagnosis not present

## 2020-09-30 DIAGNOSIS — R41841 Cognitive communication deficit: Secondary | ICD-10-CM | POA: Diagnosis not present

## 2020-09-30 DIAGNOSIS — H538 Other visual disturbances: Secondary | ICD-10-CM | POA: Diagnosis not present

## 2020-09-30 DIAGNOSIS — Z9181 History of falling: Secondary | ICD-10-CM | POA: Diagnosis not present

## 2020-09-30 DIAGNOSIS — R293 Abnormal posture: Secondary | ICD-10-CM | POA: Diagnosis not present

## 2020-09-30 DIAGNOSIS — H53452 Other localized visual field defect, left eye: Secondary | ICD-10-CM | POA: Diagnosis not present

## 2020-09-30 DIAGNOSIS — M6389 Disorders of muscle in diseases classified elsewhere, multiple sites: Secondary | ICD-10-CM | POA: Diagnosis not present

## 2020-09-30 DIAGNOSIS — S065X3S Traumatic subdural hemorrhage with loss of consciousness of 1 hour to 5 hours 59 minutes, sequela: Secondary | ICD-10-CM | POA: Diagnosis not present

## 2020-10-01 DIAGNOSIS — I10 Essential (primary) hypertension: Secondary | ICD-10-CM | POA: Diagnosis not present

## 2020-10-01 DIAGNOSIS — M542 Cervicalgia: Secondary | ICD-10-CM | POA: Diagnosis not present

## 2020-10-01 DIAGNOSIS — R2689 Other abnormalities of gait and mobility: Secondary | ICD-10-CM | POA: Diagnosis not present

## 2020-10-01 DIAGNOSIS — R2681 Unsteadiness on feet: Secondary | ICD-10-CM | POA: Diagnosis not present

## 2020-10-01 DIAGNOSIS — Z9181 History of falling: Secondary | ICD-10-CM | POA: Diagnosis not present

## 2020-10-01 DIAGNOSIS — M62562 Muscle wasting and atrophy, not elsewhere classified, left lower leg: Secondary | ICD-10-CM | POA: Diagnosis not present

## 2020-10-01 DIAGNOSIS — H538 Other visual disturbances: Secondary | ICD-10-CM | POA: Diagnosis not present

## 2020-10-01 DIAGNOSIS — R293 Abnormal posture: Secondary | ICD-10-CM | POA: Diagnosis not present

## 2020-10-01 DIAGNOSIS — N3946 Mixed incontinence: Secondary | ICD-10-CM | POA: Diagnosis not present

## 2020-10-01 DIAGNOSIS — R278 Other lack of coordination: Secondary | ICD-10-CM | POA: Diagnosis not present

## 2020-10-01 DIAGNOSIS — I69391 Dysphagia following cerebral infarction: Secondary | ICD-10-CM | POA: Diagnosis not present

## 2020-10-01 DIAGNOSIS — R41841 Cognitive communication deficit: Secondary | ICD-10-CM | POA: Diagnosis not present

## 2020-10-01 DIAGNOSIS — M6389 Disorders of muscle in diseases classified elsewhere, multiple sites: Secondary | ICD-10-CM | POA: Diagnosis not present

## 2020-10-01 DIAGNOSIS — M62561 Muscle wasting and atrophy, not elsewhere classified, right lower leg: Secondary | ICD-10-CM | POA: Diagnosis not present

## 2020-10-01 DIAGNOSIS — R41840 Attention and concentration deficit: Secondary | ICD-10-CM | POA: Diagnosis not present

## 2020-10-01 DIAGNOSIS — H53452 Other localized visual field defect, left eye: Secondary | ICD-10-CM | POA: Diagnosis not present

## 2020-10-01 DIAGNOSIS — I6601 Occlusion and stenosis of right middle cerebral artery: Secondary | ICD-10-CM | POA: Diagnosis not present

## 2020-10-01 DIAGNOSIS — S065X3S Traumatic subdural hemorrhage with loss of consciousness of 1 hour to 5 hours 59 minutes, sequela: Secondary | ICD-10-CM | POA: Diagnosis not present

## 2020-10-02 DIAGNOSIS — M62561 Muscle wasting and atrophy, not elsewhere classified, right lower leg: Secondary | ICD-10-CM | POA: Diagnosis not present

## 2020-10-02 DIAGNOSIS — I6601 Occlusion and stenosis of right middle cerebral artery: Secondary | ICD-10-CM | POA: Diagnosis not present

## 2020-10-02 DIAGNOSIS — R293 Abnormal posture: Secondary | ICD-10-CM | POA: Diagnosis not present

## 2020-10-02 DIAGNOSIS — I69391 Dysphagia following cerebral infarction: Secondary | ICD-10-CM | POA: Diagnosis not present

## 2020-10-02 DIAGNOSIS — Z9181 History of falling: Secondary | ICD-10-CM | POA: Diagnosis not present

## 2020-10-02 DIAGNOSIS — S065X3S Traumatic subdural hemorrhage with loss of consciousness of 1 hour to 5 hours 59 minutes, sequela: Secondary | ICD-10-CM | POA: Diagnosis not present

## 2020-10-02 DIAGNOSIS — M542 Cervicalgia: Secondary | ICD-10-CM | POA: Diagnosis not present

## 2020-10-02 DIAGNOSIS — N3946 Mixed incontinence: Secondary | ICD-10-CM | POA: Diagnosis not present

## 2020-10-02 DIAGNOSIS — R2681 Unsteadiness on feet: Secondary | ICD-10-CM | POA: Diagnosis not present

## 2020-10-02 DIAGNOSIS — H538 Other visual disturbances: Secondary | ICD-10-CM | POA: Diagnosis not present

## 2020-10-02 DIAGNOSIS — M62562 Muscle wasting and atrophy, not elsewhere classified, left lower leg: Secondary | ICD-10-CM | POA: Diagnosis not present

## 2020-10-02 DIAGNOSIS — R2689 Other abnormalities of gait and mobility: Secondary | ICD-10-CM | POA: Diagnosis not present

## 2020-10-02 DIAGNOSIS — R278 Other lack of coordination: Secondary | ICD-10-CM | POA: Diagnosis not present

## 2020-10-02 DIAGNOSIS — H53452 Other localized visual field defect, left eye: Secondary | ICD-10-CM | POA: Diagnosis not present

## 2020-10-02 DIAGNOSIS — R41840 Attention and concentration deficit: Secondary | ICD-10-CM | POA: Diagnosis not present

## 2020-10-02 DIAGNOSIS — M6389 Disorders of muscle in diseases classified elsewhere, multiple sites: Secondary | ICD-10-CM | POA: Diagnosis not present

## 2020-10-02 DIAGNOSIS — I10 Essential (primary) hypertension: Secondary | ICD-10-CM | POA: Diagnosis not present

## 2020-10-02 DIAGNOSIS — R41841 Cognitive communication deficit: Secondary | ICD-10-CM | POA: Diagnosis not present

## 2020-10-05 DIAGNOSIS — N3946 Mixed incontinence: Secondary | ICD-10-CM | POA: Diagnosis not present

## 2020-10-05 DIAGNOSIS — M542 Cervicalgia: Secondary | ICD-10-CM | POA: Diagnosis not present

## 2020-10-05 DIAGNOSIS — R2689 Other abnormalities of gait and mobility: Secondary | ICD-10-CM | POA: Diagnosis not present

## 2020-10-05 DIAGNOSIS — Z9181 History of falling: Secondary | ICD-10-CM | POA: Diagnosis not present

## 2020-10-05 DIAGNOSIS — I6601 Occlusion and stenosis of right middle cerebral artery: Secondary | ICD-10-CM | POA: Diagnosis not present

## 2020-10-05 DIAGNOSIS — M6389 Disorders of muscle in diseases classified elsewhere, multiple sites: Secondary | ICD-10-CM | POA: Diagnosis not present

## 2020-10-05 DIAGNOSIS — M62562 Muscle wasting and atrophy, not elsewhere classified, left lower leg: Secondary | ICD-10-CM | POA: Diagnosis not present

## 2020-10-05 DIAGNOSIS — R293 Abnormal posture: Secondary | ICD-10-CM | POA: Diagnosis not present

## 2020-10-05 DIAGNOSIS — I69391 Dysphagia following cerebral infarction: Secondary | ICD-10-CM | POA: Diagnosis not present

## 2020-10-05 DIAGNOSIS — I10 Essential (primary) hypertension: Secondary | ICD-10-CM | POA: Diagnosis not present

## 2020-10-05 DIAGNOSIS — R41841 Cognitive communication deficit: Secondary | ICD-10-CM | POA: Diagnosis not present

## 2020-10-05 DIAGNOSIS — H53452 Other localized visual field defect, left eye: Secondary | ICD-10-CM | POA: Diagnosis not present

## 2020-10-05 DIAGNOSIS — H538 Other visual disturbances: Secondary | ICD-10-CM | POA: Diagnosis not present

## 2020-10-05 DIAGNOSIS — R41840 Attention and concentration deficit: Secondary | ICD-10-CM | POA: Diagnosis not present

## 2020-10-05 DIAGNOSIS — M62561 Muscle wasting and atrophy, not elsewhere classified, right lower leg: Secondary | ICD-10-CM | POA: Diagnosis not present

## 2020-10-05 DIAGNOSIS — S065X3S Traumatic subdural hemorrhage with loss of consciousness of 1 hour to 5 hours 59 minutes, sequela: Secondary | ICD-10-CM | POA: Diagnosis not present

## 2020-10-05 DIAGNOSIS — R278 Other lack of coordination: Secondary | ICD-10-CM | POA: Diagnosis not present

## 2020-10-05 DIAGNOSIS — R2681 Unsteadiness on feet: Secondary | ICD-10-CM | POA: Diagnosis not present

## 2020-10-06 DIAGNOSIS — R41841 Cognitive communication deficit: Secondary | ICD-10-CM | POA: Diagnosis not present

## 2020-10-06 DIAGNOSIS — M542 Cervicalgia: Secondary | ICD-10-CM | POA: Diagnosis not present

## 2020-10-06 DIAGNOSIS — H538 Other visual disturbances: Secondary | ICD-10-CM | POA: Diagnosis not present

## 2020-10-06 DIAGNOSIS — M62561 Muscle wasting and atrophy, not elsewhere classified, right lower leg: Secondary | ICD-10-CM | POA: Diagnosis not present

## 2020-10-06 DIAGNOSIS — M62562 Muscle wasting and atrophy, not elsewhere classified, left lower leg: Secondary | ICD-10-CM | POA: Diagnosis not present

## 2020-10-06 DIAGNOSIS — R2681 Unsteadiness on feet: Secondary | ICD-10-CM | POA: Diagnosis not present

## 2020-10-06 DIAGNOSIS — R278 Other lack of coordination: Secondary | ICD-10-CM | POA: Diagnosis not present

## 2020-10-06 DIAGNOSIS — M6389 Disorders of muscle in diseases classified elsewhere, multiple sites: Secondary | ICD-10-CM | POA: Diagnosis not present

## 2020-10-06 DIAGNOSIS — I69391 Dysphagia following cerebral infarction: Secondary | ICD-10-CM | POA: Diagnosis not present

## 2020-10-06 DIAGNOSIS — R41840 Attention and concentration deficit: Secondary | ICD-10-CM | POA: Diagnosis not present

## 2020-10-06 DIAGNOSIS — N3946 Mixed incontinence: Secondary | ICD-10-CM | POA: Diagnosis not present

## 2020-10-06 DIAGNOSIS — H53452 Other localized visual field defect, left eye: Secondary | ICD-10-CM | POA: Diagnosis not present

## 2020-10-06 DIAGNOSIS — S065X3S Traumatic subdural hemorrhage with loss of consciousness of 1 hour to 5 hours 59 minutes, sequela: Secondary | ICD-10-CM | POA: Diagnosis not present

## 2020-10-06 DIAGNOSIS — R2689 Other abnormalities of gait and mobility: Secondary | ICD-10-CM | POA: Diagnosis not present

## 2020-10-06 DIAGNOSIS — I10 Essential (primary) hypertension: Secondary | ICD-10-CM | POA: Diagnosis not present

## 2020-10-06 DIAGNOSIS — R293 Abnormal posture: Secondary | ICD-10-CM | POA: Diagnosis not present

## 2020-10-06 DIAGNOSIS — I6601 Occlusion and stenosis of right middle cerebral artery: Secondary | ICD-10-CM | POA: Diagnosis not present

## 2020-10-06 DIAGNOSIS — Z9181 History of falling: Secondary | ICD-10-CM | POA: Diagnosis not present

## 2020-10-07 DIAGNOSIS — I10 Essential (primary) hypertension: Secondary | ICD-10-CM | POA: Diagnosis not present

## 2020-10-07 DIAGNOSIS — H538 Other visual disturbances: Secondary | ICD-10-CM | POA: Diagnosis not present

## 2020-10-07 DIAGNOSIS — S065X3S Traumatic subdural hemorrhage with loss of consciousness of 1 hour to 5 hours 59 minutes, sequela: Secondary | ICD-10-CM | POA: Diagnosis not present

## 2020-10-07 DIAGNOSIS — M6389 Disorders of muscle in diseases classified elsewhere, multiple sites: Secondary | ICD-10-CM | POA: Diagnosis not present

## 2020-10-07 DIAGNOSIS — N3946 Mixed incontinence: Secondary | ICD-10-CM | POA: Diagnosis not present

## 2020-10-07 DIAGNOSIS — M542 Cervicalgia: Secondary | ICD-10-CM | POA: Diagnosis not present

## 2020-10-07 DIAGNOSIS — H53452 Other localized visual field defect, left eye: Secondary | ICD-10-CM | POA: Diagnosis not present

## 2020-10-07 DIAGNOSIS — I6601 Occlusion and stenosis of right middle cerebral artery: Secondary | ICD-10-CM | POA: Diagnosis not present

## 2020-10-07 DIAGNOSIS — M62561 Muscle wasting and atrophy, not elsewhere classified, right lower leg: Secondary | ICD-10-CM | POA: Diagnosis not present

## 2020-10-07 DIAGNOSIS — M62562 Muscle wasting and atrophy, not elsewhere classified, left lower leg: Secondary | ICD-10-CM | POA: Diagnosis not present

## 2020-10-07 DIAGNOSIS — R2681 Unsteadiness on feet: Secondary | ICD-10-CM | POA: Diagnosis not present

## 2020-10-07 DIAGNOSIS — Z9181 History of falling: Secondary | ICD-10-CM | POA: Diagnosis not present

## 2020-10-07 DIAGNOSIS — R293 Abnormal posture: Secondary | ICD-10-CM | POA: Diagnosis not present

## 2020-10-07 DIAGNOSIS — I69391 Dysphagia following cerebral infarction: Secondary | ICD-10-CM | POA: Diagnosis not present

## 2020-10-07 DIAGNOSIS — R41841 Cognitive communication deficit: Secondary | ICD-10-CM | POA: Diagnosis not present

## 2020-10-07 DIAGNOSIS — R278 Other lack of coordination: Secondary | ICD-10-CM | POA: Diagnosis not present

## 2020-10-07 DIAGNOSIS — R2689 Other abnormalities of gait and mobility: Secondary | ICD-10-CM | POA: Diagnosis not present

## 2020-10-07 DIAGNOSIS — R41840 Attention and concentration deficit: Secondary | ICD-10-CM | POA: Diagnosis not present

## 2020-10-08 DIAGNOSIS — M6389 Disorders of muscle in diseases classified elsewhere, multiple sites: Secondary | ICD-10-CM | POA: Diagnosis not present

## 2020-10-08 DIAGNOSIS — I10 Essential (primary) hypertension: Secondary | ICD-10-CM | POA: Diagnosis not present

## 2020-10-08 DIAGNOSIS — Z9181 History of falling: Secondary | ICD-10-CM | POA: Diagnosis not present

## 2020-10-08 DIAGNOSIS — M542 Cervicalgia: Secondary | ICD-10-CM | POA: Diagnosis not present

## 2020-10-08 DIAGNOSIS — R2681 Unsteadiness on feet: Secondary | ICD-10-CM | POA: Diagnosis not present

## 2020-10-08 DIAGNOSIS — R41840 Attention and concentration deficit: Secondary | ICD-10-CM | POA: Diagnosis not present

## 2020-10-08 DIAGNOSIS — R293 Abnormal posture: Secondary | ICD-10-CM | POA: Diagnosis not present

## 2020-10-08 DIAGNOSIS — I6601 Occlusion and stenosis of right middle cerebral artery: Secondary | ICD-10-CM | POA: Diagnosis not present

## 2020-10-08 DIAGNOSIS — N3946 Mixed incontinence: Secondary | ICD-10-CM | POA: Diagnosis not present

## 2020-10-08 DIAGNOSIS — R2689 Other abnormalities of gait and mobility: Secondary | ICD-10-CM | POA: Diagnosis not present

## 2020-10-08 DIAGNOSIS — H53452 Other localized visual field defect, left eye: Secondary | ICD-10-CM | POA: Diagnosis not present

## 2020-10-08 DIAGNOSIS — M62562 Muscle wasting and atrophy, not elsewhere classified, left lower leg: Secondary | ICD-10-CM | POA: Diagnosis not present

## 2020-10-08 DIAGNOSIS — M62561 Muscle wasting and atrophy, not elsewhere classified, right lower leg: Secondary | ICD-10-CM | POA: Diagnosis not present

## 2020-10-08 DIAGNOSIS — H538 Other visual disturbances: Secondary | ICD-10-CM | POA: Diagnosis not present

## 2020-10-08 DIAGNOSIS — R278 Other lack of coordination: Secondary | ICD-10-CM | POA: Diagnosis not present

## 2020-10-08 DIAGNOSIS — S065X3S Traumatic subdural hemorrhage with loss of consciousness of 1 hour to 5 hours 59 minutes, sequela: Secondary | ICD-10-CM | POA: Diagnosis not present

## 2020-10-09 DIAGNOSIS — M542 Cervicalgia: Secondary | ICD-10-CM | POA: Diagnosis not present

## 2020-10-09 DIAGNOSIS — R278 Other lack of coordination: Secondary | ICD-10-CM | POA: Diagnosis not present

## 2020-10-09 DIAGNOSIS — R2681 Unsteadiness on feet: Secondary | ICD-10-CM | POA: Diagnosis not present

## 2020-10-09 DIAGNOSIS — N3946 Mixed incontinence: Secondary | ICD-10-CM | POA: Diagnosis not present

## 2020-10-09 DIAGNOSIS — R2689 Other abnormalities of gait and mobility: Secondary | ICD-10-CM | POA: Diagnosis not present

## 2020-10-09 DIAGNOSIS — Z9181 History of falling: Secondary | ICD-10-CM | POA: Diagnosis not present

## 2020-10-09 DIAGNOSIS — S065X3S Traumatic subdural hemorrhage with loss of consciousness of 1 hour to 5 hours 59 minutes, sequela: Secondary | ICD-10-CM | POA: Diagnosis not present

## 2020-10-09 DIAGNOSIS — R293 Abnormal posture: Secondary | ICD-10-CM | POA: Diagnosis not present

## 2020-10-09 DIAGNOSIS — M62561 Muscle wasting and atrophy, not elsewhere classified, right lower leg: Secondary | ICD-10-CM | POA: Diagnosis not present

## 2020-10-09 DIAGNOSIS — M62562 Muscle wasting and atrophy, not elsewhere classified, left lower leg: Secondary | ICD-10-CM | POA: Diagnosis not present

## 2020-10-09 DIAGNOSIS — H53452 Other localized visual field defect, left eye: Secondary | ICD-10-CM | POA: Diagnosis not present

## 2020-10-09 DIAGNOSIS — H538 Other visual disturbances: Secondary | ICD-10-CM | POA: Diagnosis not present

## 2020-10-09 DIAGNOSIS — R41840 Attention and concentration deficit: Secondary | ICD-10-CM | POA: Diagnosis not present

## 2020-10-09 DIAGNOSIS — I10 Essential (primary) hypertension: Secondary | ICD-10-CM | POA: Diagnosis not present

## 2020-10-09 DIAGNOSIS — M6389 Disorders of muscle in diseases classified elsewhere, multiple sites: Secondary | ICD-10-CM | POA: Diagnosis not present

## 2020-10-09 DIAGNOSIS — I6601 Occlusion and stenosis of right middle cerebral artery: Secondary | ICD-10-CM | POA: Diagnosis not present

## 2020-10-12 DIAGNOSIS — R2689 Other abnormalities of gait and mobility: Secondary | ICD-10-CM | POA: Diagnosis not present

## 2020-10-12 DIAGNOSIS — R41841 Cognitive communication deficit: Secondary | ICD-10-CM | POA: Diagnosis not present

## 2020-10-12 DIAGNOSIS — N3946 Mixed incontinence: Secondary | ICD-10-CM | POA: Diagnosis not present

## 2020-10-12 DIAGNOSIS — R41840 Attention and concentration deficit: Secondary | ICD-10-CM | POA: Diagnosis not present

## 2020-10-12 DIAGNOSIS — R293 Abnormal posture: Secondary | ICD-10-CM | POA: Diagnosis not present

## 2020-10-12 DIAGNOSIS — H538 Other visual disturbances: Secondary | ICD-10-CM | POA: Diagnosis not present

## 2020-10-12 DIAGNOSIS — M62562 Muscle wasting and atrophy, not elsewhere classified, left lower leg: Secondary | ICD-10-CM | POA: Diagnosis not present

## 2020-10-12 DIAGNOSIS — M6389 Disorders of muscle in diseases classified elsewhere, multiple sites: Secondary | ICD-10-CM | POA: Diagnosis not present

## 2020-10-12 DIAGNOSIS — R2681 Unsteadiness on feet: Secondary | ICD-10-CM | POA: Diagnosis not present

## 2020-10-12 DIAGNOSIS — M62561 Muscle wasting and atrophy, not elsewhere classified, right lower leg: Secondary | ICD-10-CM | POA: Diagnosis not present

## 2020-10-12 DIAGNOSIS — I10 Essential (primary) hypertension: Secondary | ICD-10-CM | POA: Diagnosis not present

## 2020-10-12 DIAGNOSIS — R278 Other lack of coordination: Secondary | ICD-10-CM | POA: Diagnosis not present

## 2020-10-12 DIAGNOSIS — S065X3S Traumatic subdural hemorrhage with loss of consciousness of 1 hour to 5 hours 59 minutes, sequela: Secondary | ICD-10-CM | POA: Diagnosis not present

## 2020-10-12 DIAGNOSIS — M542 Cervicalgia: Secondary | ICD-10-CM | POA: Diagnosis not present

## 2020-10-12 DIAGNOSIS — Z9181 History of falling: Secondary | ICD-10-CM | POA: Diagnosis not present

## 2020-10-12 DIAGNOSIS — I6601 Occlusion and stenosis of right middle cerebral artery: Secondary | ICD-10-CM | POA: Diagnosis not present

## 2020-10-12 DIAGNOSIS — I69391 Dysphagia following cerebral infarction: Secondary | ICD-10-CM | POA: Diagnosis not present

## 2020-10-12 DIAGNOSIS — H53452 Other localized visual field defect, left eye: Secondary | ICD-10-CM | POA: Diagnosis not present

## 2020-10-13 ENCOUNTER — Encounter: Payer: Self-pay | Admitting: Adult Health

## 2020-10-13 DIAGNOSIS — H538 Other visual disturbances: Secondary | ICD-10-CM | POA: Diagnosis not present

## 2020-10-13 DIAGNOSIS — M62562 Muscle wasting and atrophy, not elsewhere classified, left lower leg: Secondary | ICD-10-CM | POA: Diagnosis not present

## 2020-10-13 DIAGNOSIS — R2681 Unsteadiness on feet: Secondary | ICD-10-CM | POA: Diagnosis not present

## 2020-10-13 DIAGNOSIS — M6389 Disorders of muscle in diseases classified elsewhere, multiple sites: Secondary | ICD-10-CM | POA: Diagnosis not present

## 2020-10-13 DIAGNOSIS — N3946 Mixed incontinence: Secondary | ICD-10-CM | POA: Diagnosis not present

## 2020-10-13 DIAGNOSIS — L602 Onychogryphosis: Secondary | ICD-10-CM | POA: Diagnosis not present

## 2020-10-13 DIAGNOSIS — I6601 Occlusion and stenosis of right middle cerebral artery: Secondary | ICD-10-CM | POA: Diagnosis not present

## 2020-10-13 DIAGNOSIS — R41840 Attention and concentration deficit: Secondary | ICD-10-CM | POA: Diagnosis not present

## 2020-10-13 DIAGNOSIS — I10 Essential (primary) hypertension: Secondary | ICD-10-CM | POA: Diagnosis not present

## 2020-10-13 DIAGNOSIS — R293 Abnormal posture: Secondary | ICD-10-CM | POA: Diagnosis not present

## 2020-10-13 DIAGNOSIS — R278 Other lack of coordination: Secondary | ICD-10-CM | POA: Diagnosis not present

## 2020-10-13 DIAGNOSIS — M62561 Muscle wasting and atrophy, not elsewhere classified, right lower leg: Secondary | ICD-10-CM | POA: Diagnosis not present

## 2020-10-13 DIAGNOSIS — M542 Cervicalgia: Secondary | ICD-10-CM | POA: Diagnosis not present

## 2020-10-13 DIAGNOSIS — R2689 Other abnormalities of gait and mobility: Secondary | ICD-10-CM | POA: Diagnosis not present

## 2020-10-13 DIAGNOSIS — Q6689 Other  specified congenital deformities of feet: Secondary | ICD-10-CM | POA: Diagnosis not present

## 2020-10-13 DIAGNOSIS — Z9181 History of falling: Secondary | ICD-10-CM | POA: Diagnosis not present

## 2020-10-13 DIAGNOSIS — H53452 Other localized visual field defect, left eye: Secondary | ICD-10-CM | POA: Diagnosis not present

## 2020-10-13 DIAGNOSIS — R41841 Cognitive communication deficit: Secondary | ICD-10-CM | POA: Diagnosis not present

## 2020-10-13 DIAGNOSIS — I69391 Dysphagia following cerebral infarction: Secondary | ICD-10-CM | POA: Diagnosis not present

## 2020-10-13 DIAGNOSIS — N390 Urinary tract infection, site not specified: Secondary | ICD-10-CM | POA: Diagnosis not present

## 2020-10-13 DIAGNOSIS — S065X3S Traumatic subdural hemorrhage with loss of consciousness of 1 hour to 5 hours 59 minutes, sequela: Secondary | ICD-10-CM | POA: Diagnosis not present

## 2020-10-13 LAB — BASIC METABOLIC PANEL
BUN: 17 (ref 4–21)
CO2: 23 — AB (ref 13–22)
Chloride: 105 (ref 99–108)
Creatinine: 0.8 (ref 0.6–1.3)
Glucose: 93
Potassium: 4 (ref 3.4–5.3)
Sodium: 147 (ref 137–147)

## 2020-10-13 LAB — COMPREHENSIVE METABOLIC PANEL: Calcium: 9.5 (ref 8.7–10.7)

## 2020-10-14 DIAGNOSIS — I6601 Occlusion and stenosis of right middle cerebral artery: Secondary | ICD-10-CM | POA: Diagnosis not present

## 2020-10-14 DIAGNOSIS — R2689 Other abnormalities of gait and mobility: Secondary | ICD-10-CM | POA: Diagnosis not present

## 2020-10-14 DIAGNOSIS — M62562 Muscle wasting and atrophy, not elsewhere classified, left lower leg: Secondary | ICD-10-CM | POA: Diagnosis not present

## 2020-10-14 DIAGNOSIS — I69391 Dysphagia following cerebral infarction: Secondary | ICD-10-CM | POA: Diagnosis not present

## 2020-10-14 DIAGNOSIS — R2681 Unsteadiness on feet: Secondary | ICD-10-CM | POA: Diagnosis not present

## 2020-10-14 DIAGNOSIS — M542 Cervicalgia: Secondary | ICD-10-CM | POA: Diagnosis not present

## 2020-10-14 DIAGNOSIS — R41841 Cognitive communication deficit: Secondary | ICD-10-CM | POA: Diagnosis not present

## 2020-10-14 DIAGNOSIS — H538 Other visual disturbances: Secondary | ICD-10-CM | POA: Diagnosis not present

## 2020-10-14 DIAGNOSIS — N3946 Mixed incontinence: Secondary | ICD-10-CM | POA: Diagnosis not present

## 2020-10-14 DIAGNOSIS — M62561 Muscle wasting and atrophy, not elsewhere classified, right lower leg: Secondary | ICD-10-CM | POA: Diagnosis not present

## 2020-10-14 DIAGNOSIS — M6389 Disorders of muscle in diseases classified elsewhere, multiple sites: Secondary | ICD-10-CM | POA: Diagnosis not present

## 2020-10-14 DIAGNOSIS — R41840 Attention and concentration deficit: Secondary | ICD-10-CM | POA: Diagnosis not present

## 2020-10-14 DIAGNOSIS — R278 Other lack of coordination: Secondary | ICD-10-CM | POA: Diagnosis not present

## 2020-10-14 DIAGNOSIS — I10 Essential (primary) hypertension: Secondary | ICD-10-CM | POA: Diagnosis not present

## 2020-10-14 DIAGNOSIS — H53452 Other localized visual field defect, left eye: Secondary | ICD-10-CM | POA: Diagnosis not present

## 2020-10-14 DIAGNOSIS — S065X3S Traumatic subdural hemorrhage with loss of consciousness of 1 hour to 5 hours 59 minutes, sequela: Secondary | ICD-10-CM | POA: Diagnosis not present

## 2020-10-14 DIAGNOSIS — R293 Abnormal posture: Secondary | ICD-10-CM | POA: Diagnosis not present

## 2020-10-14 DIAGNOSIS — Z9181 History of falling: Secondary | ICD-10-CM | POA: Diagnosis not present

## 2020-10-15 ENCOUNTER — Encounter: Payer: Self-pay | Admitting: Adult Health

## 2020-10-15 ENCOUNTER — Non-Acute Institutional Stay (SKILLED_NURSING_FACILITY): Payer: Medicare Other | Admitting: Adult Health

## 2020-10-15 DIAGNOSIS — R41841 Cognitive communication deficit: Secondary | ICD-10-CM | POA: Diagnosis not present

## 2020-10-15 DIAGNOSIS — S065X3S Traumatic subdural hemorrhage with loss of consciousness of 1 hour to 5 hours 59 minutes, sequela: Secondary | ICD-10-CM | POA: Diagnosis not present

## 2020-10-15 DIAGNOSIS — I6601 Occlusion and stenosis of right middle cerebral artery: Secondary | ICD-10-CM | POA: Diagnosis not present

## 2020-10-15 DIAGNOSIS — R2689 Other abnormalities of gait and mobility: Secondary | ICD-10-CM | POA: Diagnosis not present

## 2020-10-15 DIAGNOSIS — M542 Cervicalgia: Secondary | ICD-10-CM | POA: Diagnosis not present

## 2020-10-15 DIAGNOSIS — N3946 Mixed incontinence: Secondary | ICD-10-CM | POA: Diagnosis not present

## 2020-10-15 DIAGNOSIS — R338 Other retention of urine: Secondary | ICD-10-CM | POA: Diagnosis not present

## 2020-10-15 DIAGNOSIS — I69354 Hemiplegia and hemiparesis following cerebral infarction affecting left non-dominant side: Secondary | ICD-10-CM

## 2020-10-15 DIAGNOSIS — E870 Hyperosmolality and hypernatremia: Secondary | ICD-10-CM

## 2020-10-15 DIAGNOSIS — N401 Enlarged prostate with lower urinary tract symptoms: Secondary | ICD-10-CM | POA: Diagnosis not present

## 2020-10-15 DIAGNOSIS — R2681 Unsteadiness on feet: Secondary | ICD-10-CM | POA: Diagnosis not present

## 2020-10-15 DIAGNOSIS — M62562 Muscle wasting and atrophy, not elsewhere classified, left lower leg: Secondary | ICD-10-CM | POA: Diagnosis not present

## 2020-10-15 DIAGNOSIS — M62561 Muscle wasting and atrophy, not elsewhere classified, right lower leg: Secondary | ICD-10-CM | POA: Diagnosis not present

## 2020-10-15 DIAGNOSIS — I10 Essential (primary) hypertension: Secondary | ICD-10-CM | POA: Diagnosis not present

## 2020-10-15 DIAGNOSIS — S069X3S Unspecified intracranial injury with loss of consciousness of 1 hour to 5 hours 59 minutes, sequela: Secondary | ICD-10-CM

## 2020-10-15 DIAGNOSIS — R41840 Attention and concentration deficit: Secondary | ICD-10-CM | POA: Diagnosis not present

## 2020-10-15 DIAGNOSIS — I482 Chronic atrial fibrillation, unspecified: Secondary | ICD-10-CM

## 2020-10-15 DIAGNOSIS — E785 Hyperlipidemia, unspecified: Secondary | ICD-10-CM | POA: Diagnosis not present

## 2020-10-15 DIAGNOSIS — R1312 Dysphagia, oropharyngeal phase: Secondary | ICD-10-CM | POA: Diagnosis not present

## 2020-10-15 DIAGNOSIS — I69391 Dysphagia following cerebral infarction: Secondary | ICD-10-CM | POA: Diagnosis not present

## 2020-10-15 DIAGNOSIS — R293 Abnormal posture: Secondary | ICD-10-CM | POA: Diagnosis not present

## 2020-10-15 DIAGNOSIS — E119 Type 2 diabetes mellitus without complications: Secondary | ICD-10-CM | POA: Insufficient documentation

## 2020-10-15 DIAGNOSIS — Z9181 History of falling: Secondary | ICD-10-CM | POA: Diagnosis not present

## 2020-10-15 DIAGNOSIS — E118 Type 2 diabetes mellitus with unspecified complications: Secondary | ICD-10-CM | POA: Diagnosis not present

## 2020-10-15 DIAGNOSIS — M6389 Disorders of muscle in diseases classified elsewhere, multiple sites: Secondary | ICD-10-CM | POA: Diagnosis not present

## 2020-10-15 DIAGNOSIS — H53452 Other localized visual field defect, left eye: Secondary | ICD-10-CM | POA: Diagnosis not present

## 2020-10-15 DIAGNOSIS — H538 Other visual disturbances: Secondary | ICD-10-CM | POA: Diagnosis not present

## 2020-10-15 DIAGNOSIS — R278 Other lack of coordination: Secondary | ICD-10-CM | POA: Diagnosis not present

## 2020-10-15 NOTE — Progress Notes (Signed)
Location:   [facility] Room Number: [id number] Place of Service:  SNF [phone]) Provider:  Royal Hawthorn, NP  [name], MD  Patient Care Team: [name], MD as PCP - General (Internal Medicine) [name], MD as Consulting Physician (Ophthalmology) [name], MD (Internal Medicine)  Extended Emergency Contact Information Primary Emergency Contact: [name] Address: [place], [place] [address] [name] of [place] Phone: [phone] Relation: Spouse Secondary Emergency Contact: [name], [place] [address] [name] of [place] Phone: [phone] Relation: Son  Code Status:  Full Code Goals of care: Advanced Directive information Advanced Directives 10/15/2020  Does Patient Have a Medical Advance Directive? No  Type of Advance Directive -  Does patient want to make changes to medical advance directive? -  Copy of [place] in Chart? -  Would patient like information on creating a medical advance directive? No - Patient declined  Pre-existing out of facility DNR order (yellow form or pink MOST form) -     Chief Complaint  Patient presents with   Medical Management of Chronic Issues   Quality Metric Gaps    Hep C screening, flu shot    HPI:  Pt is a 80 y.o. male seen today for medical management of chronic diseases.    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   He is s/p syncope with fall in May of 2022, TBI with SDH with craniotomy, acute right MCA s/p thrombectomy , with weakness dysphagia, and now is in skilled care rehab at [facility].   On 8/8 he had urinary retention with 700 cc residual. This continued and so a foley was placed. He is on doxazosin. UA was negative. BMP WNL except NA was 147.  He is seeing urology on 8/22.  Dysphagia:  currently on a mech soft diet with NTL  He has lost 5 lbs in the past 8 days. When he was admitted to [facility] he had IVF at night for hydration but he has been drinking fluid and BMP monitored.   CBGs: 118-140  SBP 115-125 without occasional outlier.      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    Other and unspecified hyperlipidemia    Pre-diabetes    Previous back surgery 1978, jan 2007   Psychosexual dysfunction with inhibited sexual excitement    SOB (shortness of breath)    "with heavy exercise"   Stroke ([facility]) 08/2013   "I WAS IN AFIB AND THREW A CLOT, THE EFFECTS WERE TRANSITORY AND DIDNT LAST BUT FOR 30 MINUTES"    Thoracic aortic aneurysm [facility])    Past Surgical History:  Procedure Laterality Date   AORTIC AND MITRAL  VALVE REPLACEMENT     09/2004   CARDIOVERSION     3 times from 2004-2006   CARDIOVERSION N/A 09/26/2013   Procedure: CARDIOVERSION;  Surgeon: Larey Dresser, MD;  Location: [facility];  Service: Cardiovascular;  Laterality: N/A;   CARDIOVERSION N/A 06/19/2014   Procedure: CARDIOVERSION;  Surgeon: Jerline Pain, MD;  Location: [facility];  Service: Cardiovascular;  Laterality: N/A;   CARDIOVERSION N/A 04/24/2017   Procedure: CARDIOVERSION;  Surgeon: Larey Dresser, MD;  Location: [facility] ENDOSCOPY;  Service: Cardiovascular;  Laterality: N/A;   CARDIOVERSION N/A 06/08/2017   Procedure: CARDIOVERSION;  Surgeon: Pixie Casino, MD;  Location: [facility] ENDOSCOPY;  Service:  Cardiovascular;  Laterality: N/A;   CARDIOVERSION N/A 08/24/2017   Procedure: CARDIOVERSION;  Surgeon: Skeet Latch, MD;  Location: [facility] ENDOSCOPY;  Service: Cardiovascular;  Laterality: N/A;   CARDIOVERSION N/A 02/14/2018   Procedure: CARDIOVERSION;  Surgeon: Larey Dresser, MD;  Location: [facility] ENDOSCOPY;  Service: Cardiovascular;  Laterality: N/A;   COLONOSCOPY     CRANIOTOMY N/A 07/28/2020   Procedure: CRANIOTOMY FOR EVACUATION OF SUBDURAL HEMATOMA;  Surgeon: Eustace Ferrando, MD;  Location: [facility];  Service: Neurosurgery;  Laterality: N/A;   IR ANGIO VERTEBRAL SEL SUBCLAVIAN INNOMINATE UNI R MOD SED  08/07/2020   IR CT HEAD LTD  08/07/2020   IR PERCUTANEOUS ART THROMBECTOMY/INFUSION INTRACRANIAL INC DIAG ANGIO  08/07/2020   laminectomies     10/1975 and in 03/2005   RADIOLOGY WITH ANESTHESIA N/A 08/07/2020   Procedure: IR WITH ANESTHESIA;  Surgeon: Luanne Bras, MD;  Location: [facility];  Service: Radiology;  Laterality: N/A;   TONSILLECTOMY     1950   TOTAL HIP ARTHROPLASTY Right 04/03/2018   Procedure: TOTAL HIP ARTHROPLASTY ANTERIOR APPROACH;  Surgeon: Paralee Cancel, MD;  Location: [facility];  Service: Orthopedics;  Laterality: Right;  70 mins    Allergies  Allergen Reactions   Naproxen Hives   No [facility] Food Allergies Other (See Comments)    Scallops - distress, nausea and vomitting    Allergies as of 10/15/2020       Reactions   Naproxen Hives   No [facility] Food Allergies Other (See Comments)   Scallops - distress, nausea and vomitting        Medication List        Accurate as of October 15, 2020 12:00 PM. If you have any questions, ask your nurse or doctor.          STOP taking these medications    pantoprazole sodium 40 mg/20 mL Pack Commonly known as: PROTONIX Stopped by: Royal Hawthorn, NP   sodium chloride 0.45 % solution Stopped by: Royal Hawthorn, NP       TAKE these medications    acetaminophen 325 MG tablet Commonly known as: TYLENOL Take  1-2 tablets (325-650 mg total) by mouth every 4 (four) hours as needed for mild pain.   amantadine 50 MG/5ML solution Commonly known as: SYMMETREL Take 10 mLs (100 mg total) by mouth 2 (two) times daily.   atorvastatin 80 MG tablet Commonly known as: LIPITOR Take 1 tablet (80 mg total) by mouth daily.   clobetasol cream 0.05 % Commonly known as: TEMOVATE Apply 1 application topically daily as needed (for skin irritation).   dofetilide 500 MCG capsule Commonly known as: TIKOSYN Take 1 capsule (500 mcg total) by mouth 2 (two) times daily.   doxazosin 2 MG tablet Commonly known as: CARDURA Take 1 tablet (2 mg total) by mouth daily.  Eliquis 5 MG Tabs tablet Generic drug: apixaban Take 1 tablet (5 mg total) by mouth 2 (two) times daily.   famotidine 20 MG tablet Commonly known as: PEPCID Take 20 mg by mouth daily.   hydrocortisone cream 1 % Apply topically 2 (two) times daily as needed for itching (rash).   levETIRAcetam 500 MG tablet Commonly known as: KEPPRA Take 1 tablet (500 mg total) by mouth 2 (two) times daily.   LORazepam 0.5 MG tablet Commonly known as: ATIVAN Take 0.5 mg by mouth 2 (two) times daily.   losartan 25 MG tablet Commonly known as: COZAAR Place 1 tablet (25 mg total) into feeding tube daily.   magnesium gluconate 500 MG tablet Commonly known as: MAGONATE Take 500 mg by mouth daily.   metoprolol tartrate 25 MG tablet Commonly known as: LOPRESSOR Take 1 tablet (25 mg total) by mouth 2 (two) times daily.   polyethylene glycol 17 g packet Commonly known as: MIRALAX / GLYCOLAX Take 17 g by mouth daily as needed.   polyvinyl alcohol 1.4 % ophthalmic solution Commonly known as: LIQUIFILM TEARS Place 1 drop into both eyes 2 (two) times daily.   potassium chloride 20 MEQ packet Commonly known as: KLOR-CON Take 40 mEq by mouth 2 (two) times daily.   senna-docusate 8.6-50 MG tablet Commonly known as: Senokot-S Take 1 tablet by mouth at bedtime  as needed for moderate constipation. What changed: how much to take   traZODone 100 MG tablet Commonly known as: DESYREL Take 1 tablet (100 mg total) by mouth at bedtime.        Review of Systems  Unable to perform ROS: Other   Immunization History  Administered Date(s) Administered   Fluad Quad(high Dose 65+) 12/06/2018   Influenza Whole 12/25/2008, 01/05/2010, 12/30/2011   Influenza, High Dose Seasonal PF 12/14/2016   Influenza,inj,Quad PF,6+ Mos 12/19/2013   Influenza,inj,quad, With Preservative 11/22/2017   Influenza-Unspecified 12/14/2015, 12/24/2019   Moderna SARS-COV2 Booster Vaccination 01/02/2020, 06/26/2020   Moderna Sars-Covid-2 Vaccination 04/08/2019, 05/05/2019   Pneumococcal Conjugate-13 12/19/2013   Pneumococcal Polysaccharide-23 09/24/2007, 05/22/2017   Td 12/25/2008   Tdap 03/22/2018   Zoster Recombinat (Shingrix) 07/16/2019, 10/28/2019   Zoster, Live 09/24/2007   Pertinent  Health Maintenance Due  Topic Date Due   INFLUENZA VACCINE  10/05/2020   PNA vac Low Risk Adult  Completed   Fall Risk  07/11/2019 06/27/2018 05/23/2017 05/22/2017 05/19/2016  Falls in the past year? 0 0 No No No  Number falls in past yr: 0 0 - - -  Injury with Fall? 0 - - - -  Risk for fall due to : Orthopedic patient - - - -  Risk for fall due to: Comment left hip pain; seeing ortho - - - -  Follow up Falls evaluation completed;Education provided - - - -   Functional Status Survey:    Vitals:   10/15/20 1123  BP: (!) 123/54  Pulse: 93  Resp: 14  Temp: 98.2 F (36.8 C)  SpO2: 94%  Weight: 171 lb (77.6 kg)  Height: '5\' 7"'$  (1.702 m)   Body mass index is 26.78 kg/m. Physical Exam Vitals and nursing note reviewed.  Constitutional:      General: He is not in acute distress.    Appearance: He is not diaphoretic.  HENT:     Head: Normocephalic and atraumatic.     Mouth/Throat:     Mouth: Mucous membranes are moist.     Pharynx: Oropharynx is clear.  Eyes:  Conjunctiva/sclera: Conjunctivae normal.     Pupils: Pupils are equal, round, and reactive to light.  Neck:     Thyroid: No thyromegaly.     Vascular: No JVD.     Trachea: No tracheal deviation.  Cardiovascular:     Rate and Rhythm: Normal rate and regular rhythm.     Heart sounds: No murmur heard. Pulmonary:     Effort: Pulmonary effort is normal. No respiratory distress.     Breath sounds: Normal breath sounds. No wheezing.  Abdominal:     General: Bowel sounds are normal. There is no distension.     Palpations: Abdomen is soft.     Tenderness: There is no abdominal tenderness.  Musculoskeletal:     Right lower leg: No edema.     Left lower leg: No edema.  Lymphadenopathy:     Cervical: No cervical adenopathy.  Skin:    General: Skin is warm and dry.  Neurological:     Mental Status: He is alert.     Comments: Left sided neglect. Can move all extremities.  Oriented to self and place.   Psychiatric:        Mood and Affect: Mood normal.     Labs reviewed: Recent Labs    08/09/20 1611 08/09/20 2158 08/12/20 2154 08/13/20 0513 08/13/20 1200 08/14/20 0414 09/08/20 0344 09/11/20 0402 09/15/20 0432 09/17/20 0436 09/21/20 0000 09/29/20 0000 10/13/20 0000  NA 140   < > 152*   < >  --    < > 138 141 140 140 139 145 147  K  --    < > 3.3*   < >  --    < > 3.9 4.0 3.9 4.1 4.1 4.2 4.0  CL  --    < > 122*   < >  --    < > 106 109 107 107 105 108 105  CO2  --    < > 26   < >  --    < > '27 27 27 26 '$ 23* 23* 23*  GLUCOSE  --    < > 174*   < >  --    < > 156* 109* 107* 128*  --   --   --   BUN  --    < > 33*   < >  --    < > 28* 27* '20 23 18 19 17  '$ CREATININE  --    < > 0.73   < >  --    < > 0.92 0.85 0.79 0.85 0.7 0.9 0.8  CALCIUM  --    < > 7.8*   < >  --    < > 8.9 8.7* 9.1 9.1 9.2 9.3 9.5  MG 2.0   < > 2.3   < >  --    < > 2.0  --  2.0  --  2.0  --   --   PHOS 2.7  --  1.9*  --  2.2*  --   --   --   --   --   --   --   --    < > = values in this interval not displayed.    Recent Labs    07/27/20 1833 08/01/20 0610 08/16/20 0427  AST 34 39 74*  ALT 33 52* 100*  ALKPHOS 65 46 63  BILITOT 0.9 1.1 0.8  PROT 7.0 5.9* 5.2*  ALBUMIN 3.8 2.8* 2.2*   Recent Labs  08/08/20 [id number] 08/09/20 0451 08/16/20 0427 08/17/20 0340 09/08/20 0344 09/11/20 0402 09/15/20 0432  WBC 17.5* 16.4* 7.9   < > 11.8* 10.8* 11.7*  NEUTROABS 13.5* 12.6* 5.7  --   --   --   --   HGB 12.3* 11.6* 9.0*   < > 11.6* 11.3* 11.8*  HCT 36.7* 34.6* 28.2*   < > 35.8* 35.0* 36.3*  MCV 94.1 93.5 98.6   < > 95.7 94.9 94.3  PLT 254 229 177   < > 197 189 188   < > = values in this interval not displayed.   Lab Results  Component Value Date   TSH 2.82 05/22/2017   Lab Results  Component Value Date   HGBA1C 6.2 (H) 08/08/2020   Lab Results  Component Value Date   CHOL 119 08/08/2020   HDL 42 08/08/2020   LDLCALC 56 08/08/2020   TRIG 105 08/08/2020   CHOLHDL 2.8 08/08/2020    Significant Diagnostic Results in last 30 days:  No results found.  Assessment/Plan  1. Urinary retention due to benign prostatic hyperplasia UA negative Continues with foley Has an apt with urology Could increase doxazosin, will await their input   2. Hypernatremia Mild at 147 Encourage oral fluid and recheck Monday 8/15  3. 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 Continues to be a skilled level of care.  Continue on amantadine to help with concentration and attention  Has follow up with neuro   4. Oropharyngeal dysphagia Progressed to mech soft diet with NTL Tolerating well Weight has decreased, labs ordered   5. Atrial fibrillation, chronic (HCC) Rate is controlled on tikosyn Need to monitor K and Mg at regularly intervals.  Continues on Eliquis for CVA risk reduction.   6. 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   7. Hyperlipidemia with target LDL less than 130 Lab Results  Component Value Date   LDLCALC 56 08/08/2020   On lipitor 80 mg  8. Controlled type 2 diabetes mellitus with complication, without long-term current use of insulin (HCC) Lab Results  Component Value Date   HGBA1C 6.2 (H) 08/08/2020   Diet controlled Would avoid aggressive treatment of blood sugars due to his risk of hypoglycemic unawareness.    Family/ staff Communication: discussed with his wife   Labs/tests ordered:  BMP CBC Mg TSH

## 2020-10-16 ENCOUNTER — Encounter: Payer: Self-pay | Admitting: Internal Medicine

## 2020-10-16 DIAGNOSIS — R293 Abnormal posture: Secondary | ICD-10-CM | POA: Diagnosis not present

## 2020-10-16 DIAGNOSIS — R41840 Attention and concentration deficit: Secondary | ICD-10-CM | POA: Diagnosis not present

## 2020-10-16 DIAGNOSIS — R41841 Cognitive communication deficit: Secondary | ICD-10-CM | POA: Diagnosis not present

## 2020-10-16 DIAGNOSIS — M62562 Muscle wasting and atrophy, not elsewhere classified, left lower leg: Secondary | ICD-10-CM | POA: Diagnosis not present

## 2020-10-16 DIAGNOSIS — R2681 Unsteadiness on feet: Secondary | ICD-10-CM | POA: Diagnosis not present

## 2020-10-16 DIAGNOSIS — M6389 Disorders of muscle in diseases classified elsewhere, multiple sites: Secondary | ICD-10-CM | POA: Diagnosis not present

## 2020-10-16 DIAGNOSIS — R2689 Other abnormalities of gait and mobility: Secondary | ICD-10-CM | POA: Diagnosis not present

## 2020-10-16 DIAGNOSIS — H538 Other visual disturbances: Secondary | ICD-10-CM | POA: Diagnosis not present

## 2020-10-16 DIAGNOSIS — H53452 Other localized visual field defect, left eye: Secondary | ICD-10-CM | POA: Diagnosis not present

## 2020-10-16 DIAGNOSIS — I69391 Dysphagia following cerebral infarction: Secondary | ICD-10-CM | POA: Diagnosis not present

## 2020-10-16 DIAGNOSIS — M62561 Muscle wasting and atrophy, not elsewhere classified, right lower leg: Secondary | ICD-10-CM | POA: Diagnosis not present

## 2020-10-16 DIAGNOSIS — S065X3S Traumatic subdural hemorrhage with loss of consciousness of 1 hour to 5 hours 59 minutes, sequela: Secondary | ICD-10-CM | POA: Diagnosis not present

## 2020-10-16 DIAGNOSIS — R634 Abnormal weight loss: Secondary | ICD-10-CM | POA: Diagnosis not present

## 2020-10-16 DIAGNOSIS — E870 Hyperosmolality and hypernatremia: Secondary | ICD-10-CM | POA: Diagnosis not present

## 2020-10-16 DIAGNOSIS — M542 Cervicalgia: Secondary | ICD-10-CM | POA: Diagnosis not present

## 2020-10-16 DIAGNOSIS — R278 Other lack of coordination: Secondary | ICD-10-CM | POA: Diagnosis not present

## 2020-10-16 DIAGNOSIS — I6601 Occlusion and stenosis of right middle cerebral artery: Secondary | ICD-10-CM | POA: Diagnosis not present

## 2020-10-16 DIAGNOSIS — I10 Essential (primary) hypertension: Secondary | ICD-10-CM | POA: Diagnosis not present

## 2020-10-16 DIAGNOSIS — N3946 Mixed incontinence: Secondary | ICD-10-CM | POA: Diagnosis not present

## 2020-10-16 DIAGNOSIS — Z9181 History of falling: Secondary | ICD-10-CM | POA: Diagnosis not present

## 2020-10-16 LAB — BASIC METABOLIC PANEL
BUN: 17 (ref 4–21)
CO2: 23 — AB (ref 13–22)
Chloride: 109 — AB (ref 99–108)
Creatinine: 0.8 (ref 0.6–1.3)
Glucose: 119
Potassium: 4 (ref 3.4–5.3)
Sodium: 147 (ref 137–147)

## 2020-10-16 LAB — CBC AND DIFFERENTIAL
HCT: 34 — AB (ref 41–53)
Hemoglobin: 11.5 — AB (ref 13.5–17.5)
Platelets: 245 (ref 150–399)
WBC: 11.2

## 2020-10-16 LAB — TSH: TSH: 2.35 (ref 0.41–5.90)

## 2020-10-16 LAB — COMPREHENSIVE METABOLIC PANEL: Calcium: 9.3 (ref 8.7–10.7)

## 2020-10-16 LAB — CBC: RBC: 3.86 — AB (ref 3.87–5.11)

## 2020-10-19 DIAGNOSIS — N3946 Mixed incontinence: Secondary | ICD-10-CM | POA: Diagnosis not present

## 2020-10-19 DIAGNOSIS — M62561 Muscle wasting and atrophy, not elsewhere classified, right lower leg: Secondary | ICD-10-CM | POA: Diagnosis not present

## 2020-10-19 DIAGNOSIS — R293 Abnormal posture: Secondary | ICD-10-CM | POA: Diagnosis not present

## 2020-10-19 DIAGNOSIS — H53452 Other localized visual field defect, left eye: Secondary | ICD-10-CM | POA: Diagnosis not present

## 2020-10-19 DIAGNOSIS — I6601 Occlusion and stenosis of right middle cerebral artery: Secondary | ICD-10-CM | POA: Diagnosis not present

## 2020-10-19 DIAGNOSIS — R41840 Attention and concentration deficit: Secondary | ICD-10-CM | POA: Diagnosis not present

## 2020-10-19 DIAGNOSIS — H538 Other visual disturbances: Secondary | ICD-10-CM | POA: Diagnosis not present

## 2020-10-19 DIAGNOSIS — M542 Cervicalgia: Secondary | ICD-10-CM | POA: Diagnosis not present

## 2020-10-19 DIAGNOSIS — M62562 Muscle wasting and atrophy, not elsewhere classified, left lower leg: Secondary | ICD-10-CM | POA: Diagnosis not present

## 2020-10-19 DIAGNOSIS — S065X3S Traumatic subdural hemorrhage with loss of consciousness of 1 hour to 5 hours 59 minutes, sequela: Secondary | ICD-10-CM | POA: Diagnosis not present

## 2020-10-19 DIAGNOSIS — R2681 Unsteadiness on feet: Secondary | ICD-10-CM | POA: Diagnosis not present

## 2020-10-19 DIAGNOSIS — R41841 Cognitive communication deficit: Secondary | ICD-10-CM | POA: Diagnosis not present

## 2020-10-19 DIAGNOSIS — R278 Other lack of coordination: Secondary | ICD-10-CM | POA: Diagnosis not present

## 2020-10-19 DIAGNOSIS — R2689 Other abnormalities of gait and mobility: Secondary | ICD-10-CM | POA: Diagnosis not present

## 2020-10-19 DIAGNOSIS — Z9181 History of falling: Secondary | ICD-10-CM | POA: Diagnosis not present

## 2020-10-19 DIAGNOSIS — I10 Essential (primary) hypertension: Secondary | ICD-10-CM | POA: Diagnosis not present

## 2020-10-19 DIAGNOSIS — M6389 Disorders of muscle in diseases classified elsewhere, multiple sites: Secondary | ICD-10-CM | POA: Diagnosis not present

## 2020-10-19 DIAGNOSIS — I69391 Dysphagia following cerebral infarction: Secondary | ICD-10-CM | POA: Diagnosis not present

## 2020-10-19 LAB — MAGNESIUM: Magnesium: 1.9

## 2020-10-20 ENCOUNTER — Encounter: Payer: Self-pay | Admitting: Orthopedic Surgery

## 2020-10-20 ENCOUNTER — Non-Acute Institutional Stay (SKILLED_NURSING_FACILITY): Payer: Medicare Other | Admitting: Orthopedic Surgery

## 2020-10-20 DIAGNOSIS — R41840 Attention and concentration deficit: Secondary | ICD-10-CM | POA: Diagnosis not present

## 2020-10-20 DIAGNOSIS — R338 Other retention of urine: Secondary | ICD-10-CM

## 2020-10-20 DIAGNOSIS — R319 Hematuria, unspecified: Secondary | ICD-10-CM

## 2020-10-20 DIAGNOSIS — I6601 Occlusion and stenosis of right middle cerebral artery: Secondary | ICD-10-CM | POA: Diagnosis not present

## 2020-10-20 DIAGNOSIS — R2689 Other abnormalities of gait and mobility: Secondary | ICD-10-CM | POA: Diagnosis not present

## 2020-10-20 DIAGNOSIS — M62561 Muscle wasting and atrophy, not elsewhere classified, right lower leg: Secondary | ICD-10-CM | POA: Diagnosis not present

## 2020-10-20 DIAGNOSIS — Z9181 History of falling: Secondary | ICD-10-CM | POA: Diagnosis not present

## 2020-10-20 DIAGNOSIS — R41841 Cognitive communication deficit: Secondary | ICD-10-CM | POA: Diagnosis not present

## 2020-10-20 DIAGNOSIS — M62562 Muscle wasting and atrophy, not elsewhere classified, left lower leg: Secondary | ICD-10-CM | POA: Diagnosis not present

## 2020-10-20 DIAGNOSIS — I69391 Dysphagia following cerebral infarction: Secondary | ICD-10-CM | POA: Diagnosis not present

## 2020-10-20 DIAGNOSIS — R278 Other lack of coordination: Secondary | ICD-10-CM | POA: Diagnosis not present

## 2020-10-20 DIAGNOSIS — M6389 Disorders of muscle in diseases classified elsewhere, multiple sites: Secondary | ICD-10-CM | POA: Diagnosis not present

## 2020-10-20 DIAGNOSIS — I10 Essential (primary) hypertension: Secondary | ICD-10-CM | POA: Diagnosis not present

## 2020-10-20 DIAGNOSIS — N3946 Mixed incontinence: Secondary | ICD-10-CM | POA: Diagnosis not present

## 2020-10-20 DIAGNOSIS — N401 Enlarged prostate with lower urinary tract symptoms: Secondary | ICD-10-CM

## 2020-10-20 DIAGNOSIS — R2681 Unsteadiness on feet: Secondary | ICD-10-CM | POA: Diagnosis not present

## 2020-10-20 DIAGNOSIS — R293 Abnormal posture: Secondary | ICD-10-CM | POA: Diagnosis not present

## 2020-10-20 DIAGNOSIS — S065X3S Traumatic subdural hemorrhage with loss of consciousness of 1 hour to 5 hours 59 minutes, sequela: Secondary | ICD-10-CM | POA: Diagnosis not present

## 2020-10-20 DIAGNOSIS — M542 Cervicalgia: Secondary | ICD-10-CM | POA: Diagnosis not present

## 2020-10-20 DIAGNOSIS — H53452 Other localized visual field defect, left eye: Secondary | ICD-10-CM | POA: Diagnosis not present

## 2020-10-20 DIAGNOSIS — H538 Other visual disturbances: Secondary | ICD-10-CM | POA: Diagnosis not present

## 2020-10-20 NOTE — Progress Notes (Signed)
Location:  [place] Room Number: 153-A Place of Service:  SNF [phone]) Provider:  Windell Moulding, AGNP-C  [name], MD  Patient Care Team: [name], MD as PCP - General (Internal Medicine) [name], MD as Consulting Physician (Ophthalmology) [name], MD (Internal Medicine)  Extended Emergency Contact Information Primary Emergency Contact: [name] Address: [place], [place] [address] [name] of [place] Phone: [phone] Relation: Spouse Secondary Emergency Contact: [name], [place] [address] [name] of [place] Phone: [phone] Relation: Son  Code Status:  Full code Goals of care: Advanced Directive information Advanced Directives 10/20/2020  Does Patient Have a Medical Advance Directive? No  Type of Advance Directive -  Does patient want to make changes to medical advance directive? -  Copy of [place] in Chart? -  Would patient like information on creating a medical advance directive? No - Patient declined  Pre-existing out of facility DNR order (yellow form or pink MOST form) -     Chief Complaint  Patient presents with   Acute Visit    hematuria    HPI:  Pt is a [name] [name] seen today for acute visit due to hematuria.   He currently resides on the rehabilitation unit at [facility]. Past medical history includes: rheumatic heart disease, aortic and mitral valve replacement, afib/flutter, HLD, prediabetes, CVA and HLD.   Wife present during encounter.   08/08 foley was placed due to urinary retention. Remains on flomax and finesteride daily. Today, nursing staff reports increased hematuria in foley collection bag. Wife at bedside reports he will tug at foley. He is scheduled to follow up with urology 08/22.   Nurse does not report any other concerns, vitals stable.   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    Other and unspecified hyperlipidemia    Pre-diabetes    Previous back surgery 1978, jan 2007   Psychosexual dysfunction with inhibited sexual excitement    SOB (shortness of breath)    "with heavy exercise"   Stroke ([place]) 08/2013   "I WAS IN AFIB AND THREW A CLOT, THE EFFECTS WERE TRANSITORY AND DIDNT LAST BUT FOR 30 MINUTES"    Thoracic aortic aneurysm [facility])    Past Surgical History:  Procedure Laterality Date   AORTIC AND MITRAL VALVE REPLACEMENT     09/2004   CARDIOVERSION     3 times from 2004-2006   CARDIOVERSION N/A 09/26/2013   Procedure: CARDIOVERSION;  Surgeon: Larey Dresser, MD;  Location: [place];  Service: Cardiovascular;  Laterality: N/A;   CARDIOVERSION N/A 06/19/2014   Procedure: CARDIOVERSION;  Surgeon: Jerline Pain, MD;  Location: [facility] ENDOSCOPY;  Service: Cardiovascular;  Laterality: N/A;   CARDIOVERSION N/A 04/24/2017   Procedure: CARDIOVERSION;  Surgeon: Larey Dresser, MD;  Location: [facility] ENDOSCOPY;  Service: Cardiovascular;  Laterality: N/A;   CARDIOVERSION N/A 06/08/2017   Procedure: CARDIOVERSION;  Surgeon: Pixie Casino, MD;  Location: [facility] ENDOSCOPY;  Service: Cardiovascular;  Laterality: N/A;   CARDIOVERSION N/A 08/24/2017   Procedure: CARDIOVERSION;  Surgeon: Skeet Latch, MD;  Location: [facility] ENDOSCOPY;  Service: Cardiovascular;  Laterality: N/A;   CARDIOVERSION N/A 02/14/2018   Procedure: CARDIOVERSION;  Surgeon: Larey Dresser, MD;  Location: [facility] ENDOSCOPY;  Service:  Cardiovascular;  Laterality: N/A;   COLONOSCOPY     CRANIOTOMY N/A 07/28/2020   Procedure: CRANIOTOMY FOR EVACUATION OF SUBDURAL HEMATOMA;  Surgeon: Eustace Spizzirri, MD;  Location: [place];  Service: Neurosurgery;  Laterality: N/A;   IR ANGIO VERTEBRAL SEL SUBCLAVIAN INNOMINATE UNI R MOD SED  08/07/2020   IR CT HEAD LTD  08/07/2020   IR PERCUTANEOUS ART THROMBECTOMY/INFUSION INTRACRANIAL INC DIAG ANGIO  08/07/2020   laminectomies     10/1975 and in 03/2005   RADIOLOGY WITH ANESTHESIA N/A 08/07/2020   Procedure: IR WITH ANESTHESIA;  Surgeon: Luanne Bras, MD;  Location: [place];  Service: Radiology;  Laterality: N/A;   TONSILLECTOMY     1950   TOTAL HIP ARTHROPLASTY Right 04/03/2018   Procedure: TOTAL HIP ARTHROPLASTY ANTERIOR APPROACH;  Surgeon: Paralee Cancel, MD;  Location: [facility];  Service: Orthopedics;  Laterality: Right;  70 mins    Allergies  Allergen Reactions   Naproxen Hives   No [employer] Food Allergies Other (See Comments)    Scallops - distress, nausea and vomitting    Outpatient Encounter Medications as of 10/20/2020  Medication Sig   acetaminophen (TYLENOL) 325 MG tablet Take 1-2 tablets (325-650 mg total) by mouth every 4 (four) hours as needed for mild pain.   amantadine (SYMMETREL) 50 MG/5ML solution Take 10 mLs (100 mg total) by mouth 2 (two) times daily.   apixaban (ELIQUIS) 5 MG TABS tablet Take 1 tablet (5 mg total) by mouth 2 (two) times daily.   atorvastatin (LIPITOR) 80 MG tablet Take 1 tablet (80 mg total) by mouth daily.   clobetasol cream (TEMOVATE) [id number] % Apply 1 application topically daily as needed (for skin irritation).    dofetilide (TIKOSYN) 500 MCG capsule Take 1 capsule (500 mcg total) by mouth 2 (two) times daily.   famotidine (PEPCID) 20 MG tablet Take 20 mg by mouth daily.   finasteride (PROSCAR) 5 MG tablet Take 5 mg by mouth daily.   hydrocortisone cream 1 % Apply topically 2 (two) times daily as needed for itching (rash).   levETIRAcetam (KEPPRA) 500  MG tablet Take 1 tablet (500 mg total) by mouth 2 (two) times daily.   loratadine (CLARITIN) 10 MG tablet Take 10 mg by mouth at bedtime.   LORazepam (ATIVAN) 0.5 MG tablet Take 0.5 mg by mouth 2 (two) times daily as needed.   losartan (COZAAR) 25 MG tablet Place 1 tablet (25 mg total) into feeding tube daily.   magnesium gluconate (MAGONATE) 500 MG tablet Take 500 mg by mouth daily.   metoprolol tartrate (LOPRESSOR) 25 MG tablet Take 1 tablet (25 mg total) by mouth 2 (two) times daily.   polyethylene glycol (MIRALAX / GLYCOLAX) 17 g packet Take 17 g by mouth daily as needed.   polyvinyl alcohol (LIQUIFILM TEARS) 1.4 % ophthalmic solution Place 1 drop into both eyes 2 (two) times daily.   potassium chloride (KLOR-CON) 20 MEQ packet Take 40 mEq by mouth 2 (two) times daily.   sennosides-docusate  sodium (SENOKOT-S) 8.6-50 MG tablet Take 2 tablets by mouth at bedtime.   tamsulosin (FLOMAX) 0.4 MG CAPS capsule Take 0.4 mg by mouth daily.   traZODone (DESYREL) 100 MG tablet Take 1 tablet (100 mg total) by mouth at bedtime.   No facility-administered encounter medications on file as of 10/20/2020.    Review of Systems  Constitutional:  Negative for activity change, appetite change, fatigue and fever.  Respiratory:  Negative for cough, shortness of breath and wheezing.   Cardiovascular:  Negative for chest pain and leg swelling.  Genitourinary:  Positive for hematuria.       Indwelling foley  Psychiatric/Behavioral:  Negative for dysphoric mood. The patient is not nervous/anxious.    Immunization History  Administered Date(s) Administered   Fluad Quad(high Dose 65+) 12/06/2018   Influenza Whole 12/25/2008, 01/05/2010, 12/30/2011   Influenza, High Dose Seasonal PF 12/14/2016   Influenza,inj,Quad PF,6+ Mos 12/19/2013   Influenza,inj,quad, With Preservative 11/22/2017   Influenza-Unspecified 12/14/2015, 12/24/2019   Moderna SARS-COV2 Booster Vaccination 01/02/2020, 06/26/2020   Moderna  Sars-Covid-2 Vaccination 04/08/2019, 05/05/2019   Pneumococcal Conjugate-13 12/19/2013   Pneumococcal Polysaccharide-23 09/24/2007, 05/22/2017   Td 12/25/2008   Tdap 03/22/2018   Zoster Recombinat (Shingrix) 07/16/2019, 10/28/2019   Zoster, Live 09/24/2007   Pertinent  Health Maintenance Due  Topic Date Due   FOOT EXAM  Never done   OPHTHALMOLOGY EXAM  Never done   INFLUENZA VACCINE  10/05/2020   HEMOGLOBIN A1C  02/07/2021   PNA vac Low Risk Adult  Completed   Fall Risk  07/11/2019 06/27/2018 05/23/2017 05/22/2017 05/19/2016  Falls in the past year? 0 0 No No No  Number falls in past yr: 0 0 - - -  Injury with Fall? 0 - - - -  Risk for fall due to : Orthopedic patient - - - -  Risk for fall due to: Comment left hip pain; seeing ortho - - - -  Follow up Falls evaluation completed;Education provided - - - -   Functional Status Survey:    Vitals:   10/20/20 1632  BP: 130/79  Pulse: 91  Resp: 18  Temp: 97.7 F (36.5 C)  SpO2: 96%  Weight: 171 lb 6.4 oz (77.7 kg)   Body mass index is 26.85 kg/m. Physical Exam Vitals reviewed.  Constitutional:      General: He is not in acute distress. Cardiovascular:     Rate and Rhythm: Normal rate. Rhythm irregular.     Pulses: Normal pulses.     Heart sounds: Normal heart sounds. No murmur heard. Pulmonary:     Effort: Pulmonary effort is normal. No respiratory distress.     Breath sounds: Normal breath sounds. No wheezing.  Genitourinary:    Comments: Dried blood on glans of penis near foley insertion site, foley collection bag with good output, urine appears blood tinged/tea colored.  Skin:    General: Skin is warm and dry.     Capillary Refill: Capillary refill takes less than 2 seconds.  Neurological:     General: No focal deficit present.     Mental Status: He is alert. Mental status is at baseline.  Psychiatric:        Mood and Affect: Mood normal.        Behavior: Behavior normal.    Labs reviewed: Recent Labs     08/09/20 1611 08/09/20 2158 08/12/20 2154 08/13/20 0513 08/13/20 1200 08/14/20 0414 09/11/20 0402 09/15/20 0432 09/17/20 0436 09/17/20 0436 09/21/20 0000 09/29/20 0000 10/13/20 0000 10/16/20  0000  NA 140   < > 152*   < >  --    < > 141 140 140   < > 139 145 147 147  K  --    < > 3.3*   < >  --    < > 4.0 3.9 4.1  --  4.1 4.2 4.0 4.0  CL  --    < > 122*   < >  --    < > 109 107 107  --  105 108 105 109*  CO2  --    < > 26   < >  --    < > '27 27 26  '$ --  23* 23* 23* 23*  GLUCOSE  --    < > 174*   < >  --    < > 109* 107* 128*  --   --   --   --   --   BUN  --    < > 33*   < >  --    < > 27* 20 23   < > '18 19 17 17  '$ CREATININE  --    < > 0.73   < >  --    < > 0.85 0.79 0.85   < > 0.7 0.9 0.8 0.8  CALCIUM  --    < > 7.8*   < >  --    < > 8.7* 9.1 9.1  --  9.2 9.3 9.5 9.3  MG 2.0   < > 2.3   < >  --    < >  --  2.0  --   --  2.0  --   --  1.9  PHOS 2.7  --  1.9*  --  2.2*  --   --   --   --   --   --   --   --   --    < > = values in this interval not displayed.   Recent Labs    07/27/20 1833 08/01/20 0610 08/16/20 0427  AST 34 39 74*  ALT 33 52* 100*  ALKPHOS 65 46 63  BILITOT 0.9 1.1 0.8  PROT 7.0 5.9* 5.2*  ALBUMIN 3.8 2.8* 2.2*   Recent Labs    08/08/20 0625 08/09/20 0451 08/16/20 0427 08/17/20 0340 09/08/20 0344 09/11/20 0402 09/15/20 0432 10/16/20 0000  WBC 17.5* 16.4* 7.9   < > 11.8* 10.8* 11.7* 11.2  NEUTROABS 13.5* 12.6* 5.7  --   --   --   --   --   HGB 12.3* 11.6* 9.0*   < > 11.6* 11.3* 11.8* 11.5*  HCT 36.7* 34.6* 28.2*   < > 35.8* 35.0* 36.3* 34*  MCV 94.1 93.5 98.6   < > 95.7 94.9 94.3  --   PLT 254 229 177   < > 197 189 188 245   < > = values in this interval not displayed.   Lab Results  Component Value Date   TSH 2.35 10/16/2020   Lab Results  Component Value Date   HGBA1C 6.2 (H) 08/08/2020   Lab Results  Component Value Date   CHOL 119 08/08/2020   HDL 42 08/08/2020   LDLCALC 56 08/08/2020   TRIG 105 08/08/2020   CHOLHDL 2.8  08/08/2020    Significant Diagnostic Results in last 30 days:  No results found.  Assessment/Plan 1. Hematuria, unspecified type - suspect some urethral trauma from tugging at foley - recommend  flushing foley with 30cc normal saline tid x 3 days to prevent clotting - encourage hydration with water - advised staff to make sure foley strap stays elevated to prevent pulling  2. Urinary retention due to benign prostatic hyperplasia - foley placed due to retention - cont Flomax and finasteride - f/u with urology 10/26/2020  Family/ staff Communication: plan discussed with patient and nurse  Labs/tests ordered:  none

## 2020-10-20 NOTE — Progress Notes (Deleted)
Location:  [place] Room Number: 153-A Place of Service:  SNF (31) Provider:  Yvonna Alanis, NP   Patient Care Team: [name], MD as PCP - General (Internal Medicine) [name], MD as Consulting Physician (Ophthalmology) [name], MD (Internal Medicine)  Extended Emergency Contact Information Primary Emergency Contact: [name] Address: [place], [place] [address] [name] [name] of [place] Phone: [phone] Relation: Spouse Secondary Emergency Contact: [name], [place] [address] [name] [name] of [place] Phone: [phone] Relation: Son  Code Status:  Full Code  Goals of care: Advanced Directive information Advanced Directives 10/15/2020  Does Patient Have a Medical Advance Directive? No  Type of Advance Directive -  Does patient want to make changes to medical advance directive? -  Copy of [place] in Chart? -  Would patient like information on creating a medical advance directive? No - Patient declined  Pre-existing out of facility DNR order (yellow form or pink MOST form) -     Chief Complaint  Patient presents with   Acute Visit    Dark blood in foley     HPI:  Pt is a 80 y.o. 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    Other and unspecified hyperlipidemia    Pre-diabetes    Previous back surgery 1978, jan 2007    Psychosexual dysfunction with inhibited sexual excitement    SOB (shortness of breath)    "with heavy exercise"   Stroke ([place]) 08/2013   "I WAS IN AFIB AND THREW A CLOT, THE EFFECTS WERE TRANSITORY AND DIDNT LAST BUT FOR 30 MINUTES"    Thoracic aortic aneurysm [facility])    Past Surgical History:  Procedure Laterality Date   AORTIC AND MITRAL VALVE REPLACEMENT     09/2004   CARDIOVERSION     3 times from 2004-2006   CARDIOVERSION N/A 09/26/2013   Procedure: CARDIOVERSION;  Surgeon: Larey Dresser, MD;  Location: [place];  Service: Cardiovascular;  Laterality: N/A;   CARDIOVERSION N/A 06/19/2014   Procedure: CARDIOVERSION;  Surgeon: Jerline Pain, MD;  Location: [place];  Service: Cardiovascular;  Laterality: N/A;   CARDIOVERSION N/A 04/24/2017   Procedure: CARDIOVERSION;  Surgeon: Larey Dresser, MD;  Location: [place];  Service: Cardiovascular;  Laterality: N/A;   CARDIOVERSION N/A 06/08/2017   Procedure: CARDIOVERSION;  Surgeon: Pixie Casino, MD;  Location: [facility] ENDOSCOPY;  Service: Cardiovascular;  Laterality: N/A;   CARDIOVERSION N/A 08/24/2017   Procedure: CARDIOVERSION;  Surgeon: Skeet Latch, MD;  Location: [facility] ENDOSCOPY;  Service: Cardiovascular;  Laterality: N/A;   CARDIOVERSION N/A 02/14/2018   Procedure: CARDIOVERSION;  Surgeon: Larey Dresser, MD;  Location: [facility] ENDOSCOPY;  Service: Cardiovascular;  Laterality: N/A;   COLONOSCOPY     CRANIOTOMY N/A 07/28/2020   Procedure: CRANIOTOMY FOR EVACUATION OF SUBDURAL HEMATOMA;  Surgeon: Eustace Rushlow, MD;  Location: [place];  Service: Neurosurgery;  Laterality: N/A;   IR ANGIO VERTEBRAL SEL SUBCLAVIAN INNOMINATE UNI R MOD SED  08/07/2020   IR CT HEAD LTD  08/07/2020   IR PERCUTANEOUS ART THROMBECTOMY/INFUSION INTRACRANIAL INC DIAG ANGIO  08/07/2020   laminectomies     10/1975 and in 03/2005   RADIOLOGY WITH ANESTHESIA N/A 08/07/2020   Procedure: IR WITH ANESTHESIA;  Surgeon: Luanne Bras, MD;  Location: [place];   Service: Radiology;  Laterality: N/A;   TONSILLECTOMY     1950   TOTAL HIP ARTHROPLASTY Right 04/03/2018   Procedure: TOTAL HIP ARTHROPLASTY ANTERIOR APPROACH;  Surgeon: Paralee Cancel, MD;  [facility];  Service: Orthopedics;  Laterality: Right;  70 mins    Allergies  Allergen Reactions   Naproxen Hives   No [employer] Food Allergies Other (See Comments)    Scallops - distress, nausea and vomitting    Outpatient Encounter Medications as of 10/20/2020  Medication Sig   acetaminophen (TYLENOL) 325 MG tablet Take 1-2 tablets (325-650 mg total) by mouth every 4 (four) hours as needed for mild pain.   amantadine (SYMMETREL) 50 MG/5ML solution Take 10 mLs (100 mg total) by mouth 2 (two) times daily.   apixaban (ELIQUIS) 5 MG TABS tablet Take 1 tablet (5 mg total) by mouth 2 (two) times daily.   atorvastatin (LIPITOR) 80 MG tablet Take 1 tablet (80 mg total) by mouth daily.   clobetasol cream (TEMOVATE) [id number] % Apply 1 application topically daily as needed (for skin irritation).    dofetilide (TIKOSYN) 500 MCG capsule Take 1 capsule (500 mcg total) by mouth 2 (two) times daily.   famotidine (PEPCID) 20 MG tablet Take 20 mg by mouth daily.   finasteride (PROSCAR) 5 MG tablet Take 5 mg by mouth daily.   hydrocortisone cream 1 % Apply topically 2 (two) times daily as needed for itching (rash).   levETIRAcetam (KEPPRA) 500 MG tablet Take 1 tablet (500 mg total) by mouth 2 (two) times daily.   loratadine (CLARITIN) 10 MG tablet Take 10 mg by mouth at bedtime.   LORazepam (ATIVAN) 0.5 MG tablet Take 0.5 mg by mouth 2 (two) times daily as needed.   losartan (COZAAR) 25 MG tablet Place 1 tablet (25 mg total) into feeding tube daily.   magnesium gluconate (MAGONATE) 500 MG tablet Take 500 mg by mouth daily.   metoprolol tartrate (LOPRESSOR) 25 MG tablet Take 1 tablet (25 mg total) by mouth 2 (two) times daily.   polyethylene glycol (MIRALAX / GLYCOLAX) 17 g packet Take 17 g by mouth daily as  needed.   polyvinyl alcohol (LIQUIFILM TEARS) 1.4 % ophthalmic solution Place 1 drop into both eyes 2 (two) times daily.   potassium chloride (KLOR-CON) 20 MEQ packet Take 40 mEq by mouth 2 (two) times daily.   sennosides-docusate sodium (SENOKOT-S) 8.6-50 MG tablet Take 2 tablets by mouth at bedtime.   tamsulosin (FLOMAX) 0.4 MG CAPS capsule Take 0.4 mg by mouth daily.   traZODone (DESYREL) 100 MG tablet Take 1 tablet (100 mg total) by mouth at bedtime.   [DISCONTINUED] doxazosin (CARDURA) 2 MG tablet Take 1 tablet (2 mg total) by mouth daily.   [DISCONTINUED] senna-docusate (SENOKOT-S) 8.6-50 MG tablet Take 1 tablet by mouth at bedtime as needed for moderate constipation.   No facility-administered encounter medications on  file as of 10/20/2020.    Review of Systems  Immunization History  Administered Date(s) Administered   Fluad Quad(high Dose 65+) 12/06/2018   Influenza Whole 12/25/2008, 01/05/2010, 12/30/2011   Influenza, High Dose Seasonal PF 12/14/2016   Influenza,inj,Quad PF,6+ Mos 12/19/2013   Influenza,inj,quad, With Preservative 11/22/2017   Influenza-Unspecified 12/14/2015, 12/24/2019   Moderna SARS-COV2 Booster Vaccination 01/02/2020, 06/26/2020   Moderna Sars-Covid-2 Vaccination 04/08/2019, 05/05/2019   Pneumococcal Conjugate-13 12/19/2013   Pneumococcal Polysaccharide-23 09/24/2007, 05/22/2017   Td 12/25/2008   Tdap 03/22/2018   Zoster Recombinat (Shingrix) 07/16/2019, 10/28/2019   Zoster, Live 09/24/2007   Pertinent  Health Maintenance Due  Topic Date Due   FOOT EXAM  Never done   OPHTHALMOLOGY EXAM  Never done   INFLUENZA VACCINE  10/05/2020   HEMOGLOBIN A1C  02/07/2021   PNA vac Low Risk Adult  Completed   Fall Risk  07/11/2019 06/27/2018 05/23/2017 05/22/2017 05/19/2016  Falls in the past year? 0 0 No No No  Number falls in past yr: 0 0 - - -  Injury with Fall? 0 - - - -  Risk for fall due to : Orthopedic patient - - - -  Risk for fall due to: Comment left hip  pain; seeing ortho - - - -  Follow up Falls evaluation completed;Education provided - - - -   Functional Status Survey:    Vitals:   10/20/20 1504  BP: (!) 136/91  Pulse: 81  Resp: 16  Temp: 98.8 F (37.1 C)  SpO2: 96%  Weight: 171 lb 6.4 oz (77.7 kg)  Height: '5\' 7"'$  (1.702 m)   Body mass index is 26.85 kg/m. Physical Exam  Labs reviewed: Recent Labs    08/09/20 1611 08/09/20 2158 08/12/20 2154 08/13/20 0513 08/13/20 1200 08/14/20 0414 09/11/20 0402 09/15/20 0432 09/17/20 0436 09/17/20 0436 09/21/20 0000 09/29/20 0000 10/13/20 0000 10/16/20 0000  NA 140   < > 152*   < >  --    < > 141 140 140   < > 139 145 147 147  K  --    < > 3.3*   < >  --    < > 4.0 3.9 4.1  --  4.1 4.2 4.0 4.0  CL  --    < > 122*   < >  --    < > 109 107 107  --  105 108 105 109*  CO2  --    < > 26   < >  --    < > '27 27 26  '$ --  23* 23* 23* 23*  GLUCOSE  --    < > 174*   < >  --    < > 109* 107* 128*  --   --   --   --   --   BUN  --    < > 33*   < >  --    < > 27* 20 23   < > '18 19 17 17  '$ CREATININE  --    < > 0.73   < >  --    < > 0.85 0.79 0.85   < > 0.7 0.9 0.8 0.8  CALCIUM  --    < > 7.8*   < >  --    < > 8.7* 9.1 9.1  --  9.2 9.3 9.5 9.3  MG 2.0   < > 2.3   < >  --    < >  --  2.0  --   --  2.0  --   --  1.9  PHOS 2.7  --  1.9*  --  2.2*  --   --   --   --   --   --   --   --   --    < > = values in this interval not displayed.   Recent Labs    07/27/20 1833 08/01/20 0610 08/16/20 0427  AST 34 39 74*  ALT 33 52* 100*  ALKPHOS 65 46 63  BILITOT 0.9 1.1 0.8  PROT 7.0 5.9* 5.2*  ALBUMIN 3.8 2.8* 2.2*   Recent Labs    08/08/20 0625 08/09/20 0451 08/16/20 0427 08/17/20 0340 09/08/20 0344 09/11/20 0402 09/15/20 0432 10/16/20 0000  WBC 17.5* 16.4* 7.9   < > 11.8* 10.8* 11.7* 11.2  NEUTROABS 13.5* 12.6* 5.7  --   --   --   --   --   HGB 12.3* 11.6* 9.0*   < > 11.6* 11.3* 11.8* 11.5*  HCT 36.7* 34.6* 28.2*   < > 35.8* 35.0* 36.3* 34*  MCV 94.1 93.5 98.6   < > 95.7 94.9 94.3   --   PLT 254 229 177   < > 197 189 188 245   < > = values in this interval not displayed.   Lab Results  Component Value Date   TSH 2.35 10/16/2020   Lab Results  Component Value Date   HGBA1C 6.2 (H) 08/08/2020   Lab Results  Component Value Date   CHOL 119 08/08/2020   HDL 42 08/08/2020   LDLCALC 56 08/08/2020   TRIG 105 08/08/2020   CHOLHDL 2.8 08/08/2020    Significant Diagnostic Results in last 30 days:  No results found.  Assessment/Plan There are no diagnoses linked to this encounter.   Family/ staff Communication: ***  Labs/tests ordered:  ***

## 2020-10-21 DIAGNOSIS — M62561 Muscle wasting and atrophy, not elsewhere classified, right lower leg: Secondary | ICD-10-CM | POA: Diagnosis not present

## 2020-10-21 DIAGNOSIS — H538 Other visual disturbances: Secondary | ICD-10-CM | POA: Diagnosis not present

## 2020-10-21 DIAGNOSIS — N3946 Mixed incontinence: Secondary | ICD-10-CM | POA: Diagnosis not present

## 2020-10-21 DIAGNOSIS — R41841 Cognitive communication deficit: Secondary | ICD-10-CM | POA: Diagnosis not present

## 2020-10-21 DIAGNOSIS — I6601 Occlusion and stenosis of right middle cerebral artery: Secondary | ICD-10-CM | POA: Diagnosis not present

## 2020-10-21 DIAGNOSIS — I69391 Dysphagia following cerebral infarction: Secondary | ICD-10-CM | POA: Diagnosis not present

## 2020-10-21 DIAGNOSIS — M542 Cervicalgia: Secondary | ICD-10-CM | POA: Diagnosis not present

## 2020-10-21 DIAGNOSIS — I10 Essential (primary) hypertension: Secondary | ICD-10-CM | POA: Diagnosis not present

## 2020-10-21 DIAGNOSIS — Z9181 History of falling: Secondary | ICD-10-CM | POA: Diagnosis not present

## 2020-10-21 DIAGNOSIS — M62562 Muscle wasting and atrophy, not elsewhere classified, left lower leg: Secondary | ICD-10-CM | POA: Diagnosis not present

## 2020-10-21 DIAGNOSIS — S065X3S Traumatic subdural hemorrhage with loss of consciousness of 1 hour to 5 hours 59 minutes, sequela: Secondary | ICD-10-CM | POA: Diagnosis not present

## 2020-10-21 DIAGNOSIS — M6389 Disorders of muscle in diseases classified elsewhere, multiple sites: Secondary | ICD-10-CM | POA: Diagnosis not present

## 2020-10-21 DIAGNOSIS — R278 Other lack of coordination: Secondary | ICD-10-CM | POA: Diagnosis not present

## 2020-10-21 DIAGNOSIS — R41840 Attention and concentration deficit: Secondary | ICD-10-CM | POA: Diagnosis not present

## 2020-10-21 DIAGNOSIS — R2681 Unsteadiness on feet: Secondary | ICD-10-CM | POA: Diagnosis not present

## 2020-10-21 DIAGNOSIS — H53452 Other localized visual field defect, left eye: Secondary | ICD-10-CM | POA: Diagnosis not present

## 2020-10-21 DIAGNOSIS — R293 Abnormal posture: Secondary | ICD-10-CM | POA: Diagnosis not present

## 2020-10-21 DIAGNOSIS — R2689 Other abnormalities of gait and mobility: Secondary | ICD-10-CM | POA: Diagnosis not present

## 2020-10-21 NOTE — Progress Notes (Signed)
This encounter was created in error - please disregard.

## 2020-10-22 DIAGNOSIS — I69391 Dysphagia following cerebral infarction: Secondary | ICD-10-CM | POA: Diagnosis not present

## 2020-10-22 DIAGNOSIS — I10 Essential (primary) hypertension: Secondary | ICD-10-CM | POA: Diagnosis not present

## 2020-10-22 DIAGNOSIS — R293 Abnormal posture: Secondary | ICD-10-CM | POA: Diagnosis not present

## 2020-10-22 DIAGNOSIS — N3946 Mixed incontinence: Secondary | ICD-10-CM | POA: Diagnosis not present

## 2020-10-22 DIAGNOSIS — H538 Other visual disturbances: Secondary | ICD-10-CM | POA: Diagnosis not present

## 2020-10-22 DIAGNOSIS — R2689 Other abnormalities of gait and mobility: Secondary | ICD-10-CM | POA: Diagnosis not present

## 2020-10-22 DIAGNOSIS — M6389 Disorders of muscle in diseases classified elsewhere, multiple sites: Secondary | ICD-10-CM | POA: Diagnosis not present

## 2020-10-22 DIAGNOSIS — R2681 Unsteadiness on feet: Secondary | ICD-10-CM | POA: Diagnosis not present

## 2020-10-22 DIAGNOSIS — M542 Cervicalgia: Secondary | ICD-10-CM | POA: Diagnosis not present

## 2020-10-22 DIAGNOSIS — R41840 Attention and concentration deficit: Secondary | ICD-10-CM | POA: Diagnosis not present

## 2020-10-22 DIAGNOSIS — S065X3S Traumatic subdural hemorrhage with loss of consciousness of 1 hour to 5 hours 59 minutes, sequela: Secondary | ICD-10-CM | POA: Diagnosis not present

## 2020-10-22 DIAGNOSIS — M62562 Muscle wasting and atrophy, not elsewhere classified, left lower leg: Secondary | ICD-10-CM | POA: Diagnosis not present

## 2020-10-22 DIAGNOSIS — R41841 Cognitive communication deficit: Secondary | ICD-10-CM | POA: Diagnosis not present

## 2020-10-22 DIAGNOSIS — I6601 Occlusion and stenosis of right middle cerebral artery: Secondary | ICD-10-CM | POA: Diagnosis not present

## 2020-10-22 DIAGNOSIS — R278 Other lack of coordination: Secondary | ICD-10-CM | POA: Diagnosis not present

## 2020-10-22 DIAGNOSIS — M62561 Muscle wasting and atrophy, not elsewhere classified, right lower leg: Secondary | ICD-10-CM | POA: Diagnosis not present

## 2020-10-22 DIAGNOSIS — H53452 Other localized visual field defect, left eye: Secondary | ICD-10-CM | POA: Diagnosis not present

## 2020-10-22 DIAGNOSIS — Z9181 History of falling: Secondary | ICD-10-CM | POA: Diagnosis not present

## 2020-10-23 DIAGNOSIS — H53452 Other localized visual field defect, left eye: Secondary | ICD-10-CM | POA: Diagnosis not present

## 2020-10-23 DIAGNOSIS — R2681 Unsteadiness on feet: Secondary | ICD-10-CM | POA: Diagnosis not present

## 2020-10-23 DIAGNOSIS — M6389 Disorders of muscle in diseases classified elsewhere, multiple sites: Secondary | ICD-10-CM | POA: Diagnosis not present

## 2020-10-23 DIAGNOSIS — N3946 Mixed incontinence: Secondary | ICD-10-CM | POA: Diagnosis not present

## 2020-10-23 DIAGNOSIS — M62562 Muscle wasting and atrophy, not elsewhere classified, left lower leg: Secondary | ICD-10-CM | POA: Diagnosis not present

## 2020-10-23 DIAGNOSIS — I10 Essential (primary) hypertension: Secondary | ICD-10-CM | POA: Diagnosis not present

## 2020-10-23 DIAGNOSIS — R41840 Attention and concentration deficit: Secondary | ICD-10-CM | POA: Diagnosis not present

## 2020-10-23 DIAGNOSIS — Z9181 History of falling: Secondary | ICD-10-CM | POA: Diagnosis not present

## 2020-10-23 DIAGNOSIS — I6601 Occlusion and stenosis of right middle cerebral artery: Secondary | ICD-10-CM | POA: Diagnosis not present

## 2020-10-23 DIAGNOSIS — M62561 Muscle wasting and atrophy, not elsewhere classified, right lower leg: Secondary | ICD-10-CM | POA: Diagnosis not present

## 2020-10-23 DIAGNOSIS — R2689 Other abnormalities of gait and mobility: Secondary | ICD-10-CM | POA: Diagnosis not present

## 2020-10-23 DIAGNOSIS — I69391 Dysphagia following cerebral infarction: Secondary | ICD-10-CM | POA: Diagnosis not present

## 2020-10-23 DIAGNOSIS — R278 Other lack of coordination: Secondary | ICD-10-CM | POA: Diagnosis not present

## 2020-10-23 DIAGNOSIS — R293 Abnormal posture: Secondary | ICD-10-CM | POA: Diagnosis not present

## 2020-10-23 DIAGNOSIS — H538 Other visual disturbances: Secondary | ICD-10-CM | POA: Diagnosis not present

## 2020-10-23 DIAGNOSIS — S065X3S Traumatic subdural hemorrhage with loss of consciousness of 1 hour to 5 hours 59 minutes, sequela: Secondary | ICD-10-CM | POA: Diagnosis not present

## 2020-10-23 DIAGNOSIS — M542 Cervicalgia: Secondary | ICD-10-CM | POA: Diagnosis not present

## 2020-10-23 DIAGNOSIS — R41841 Cognitive communication deficit: Secondary | ICD-10-CM | POA: Diagnosis not present

## 2020-10-26 DIAGNOSIS — R278 Other lack of coordination: Secondary | ICD-10-CM | POA: Diagnosis not present

## 2020-10-26 DIAGNOSIS — S065X3S Traumatic subdural hemorrhage with loss of consciousness of 1 hour to 5 hours 59 minutes, sequela: Secondary | ICD-10-CM | POA: Diagnosis not present

## 2020-10-26 DIAGNOSIS — M62562 Muscle wasting and atrophy, not elsewhere classified, left lower leg: Secondary | ICD-10-CM | POA: Diagnosis not present

## 2020-10-26 DIAGNOSIS — H538 Other visual disturbances: Secondary | ICD-10-CM | POA: Diagnosis not present

## 2020-10-26 DIAGNOSIS — R41840 Attention and concentration deficit: Secondary | ICD-10-CM | POA: Diagnosis not present

## 2020-10-26 DIAGNOSIS — N401 Enlarged prostate with lower urinary tract symptoms: Secondary | ICD-10-CM | POA: Diagnosis not present

## 2020-10-26 DIAGNOSIS — R2681 Unsteadiness on feet: Secondary | ICD-10-CM | POA: Diagnosis not present

## 2020-10-26 DIAGNOSIS — R41841 Cognitive communication deficit: Secondary | ICD-10-CM | POA: Diagnosis not present

## 2020-10-26 DIAGNOSIS — M542 Cervicalgia: Secondary | ICD-10-CM | POA: Diagnosis not present

## 2020-10-26 DIAGNOSIS — R3914 Feeling of incomplete bladder emptying: Secondary | ICD-10-CM | POA: Diagnosis not present

## 2020-10-26 DIAGNOSIS — H53452 Other localized visual field defect, left eye: Secondary | ICD-10-CM | POA: Diagnosis not present

## 2020-10-26 DIAGNOSIS — M62561 Muscle wasting and atrophy, not elsewhere classified, right lower leg: Secondary | ICD-10-CM | POA: Diagnosis not present

## 2020-10-26 DIAGNOSIS — R2689 Other abnormalities of gait and mobility: Secondary | ICD-10-CM | POA: Diagnosis not present

## 2020-10-26 DIAGNOSIS — R293 Abnormal posture: Secondary | ICD-10-CM | POA: Diagnosis not present

## 2020-10-26 DIAGNOSIS — I10 Essential (primary) hypertension: Secondary | ICD-10-CM | POA: Diagnosis not present

## 2020-10-26 DIAGNOSIS — M6389 Disorders of muscle in diseases classified elsewhere, multiple sites: Secondary | ICD-10-CM | POA: Diagnosis not present

## 2020-10-26 DIAGNOSIS — Z9181 History of falling: Secondary | ICD-10-CM | POA: Diagnosis not present

## 2020-10-26 DIAGNOSIS — N3946 Mixed incontinence: Secondary | ICD-10-CM | POA: Diagnosis not present

## 2020-10-26 DIAGNOSIS — R972 Elevated prostate specific antigen [PSA]: Secondary | ICD-10-CM | POA: Diagnosis not present

## 2020-10-26 DIAGNOSIS — I69391 Dysphagia following cerebral infarction: Secondary | ICD-10-CM | POA: Diagnosis not present

## 2020-10-26 DIAGNOSIS — I6601 Occlusion and stenosis of right middle cerebral artery: Secondary | ICD-10-CM | POA: Diagnosis not present

## 2020-10-27 ENCOUNTER — Telehealth: Payer: Self-pay | Admitting: Adult Health

## 2020-10-27 ENCOUNTER — Ambulatory Visit (INDEPENDENT_AMBULATORY_CARE_PROVIDER_SITE_OTHER): Payer: Medicare Other | Admitting: Adult Health

## 2020-10-27 ENCOUNTER — Encounter: Payer: Self-pay | Admitting: Adult Health

## 2020-10-27 VITALS — BP 120/75 | HR 82

## 2020-10-27 DIAGNOSIS — I69398 Other sequelae of cerebral infarction: Secondary | ICD-10-CM | POA: Diagnosis not present

## 2020-10-27 DIAGNOSIS — R569 Unspecified convulsions: Secondary | ICD-10-CM

## 2020-10-27 DIAGNOSIS — R41841 Cognitive communication deficit: Secondary | ICD-10-CM | POA: Diagnosis not present

## 2020-10-27 DIAGNOSIS — I6601 Occlusion and stenosis of right middle cerebral artery: Secondary | ICD-10-CM | POA: Diagnosis not present

## 2020-10-27 DIAGNOSIS — H538 Other visual disturbances: Secondary | ICD-10-CM | POA: Diagnosis not present

## 2020-10-27 DIAGNOSIS — R293 Abnormal posture: Secondary | ICD-10-CM | POA: Diagnosis not present

## 2020-10-27 DIAGNOSIS — N3946 Mixed incontinence: Secondary | ICD-10-CM | POA: Diagnosis not present

## 2020-10-27 DIAGNOSIS — R2681 Unsteadiness on feet: Secondary | ICD-10-CM | POA: Diagnosis not present

## 2020-10-27 DIAGNOSIS — M62562 Muscle wasting and atrophy, not elsewhere classified, left lower leg: Secondary | ICD-10-CM | POA: Diagnosis not present

## 2020-10-27 DIAGNOSIS — I480 Paroxysmal atrial fibrillation: Secondary | ICD-10-CM

## 2020-10-27 DIAGNOSIS — I69391 Dysphagia following cerebral infarction: Secondary | ICD-10-CM | POA: Diagnosis not present

## 2020-10-27 DIAGNOSIS — M62561 Muscle wasting and atrophy, not elsewhere classified, right lower leg: Secondary | ICD-10-CM | POA: Diagnosis not present

## 2020-10-27 DIAGNOSIS — I609 Nontraumatic subarachnoid hemorrhage, unspecified: Secondary | ICD-10-CM | POA: Diagnosis not present

## 2020-10-27 DIAGNOSIS — M6389 Disorders of muscle in diseases classified elsewhere, multiple sites: Secondary | ICD-10-CM | POA: Diagnosis not present

## 2020-10-27 DIAGNOSIS — I63411 Cerebral infarction due to embolism of right middle cerebral artery: Secondary | ICD-10-CM

## 2020-10-27 DIAGNOSIS — H53452 Other localized visual field defect, left eye: Secondary | ICD-10-CM | POA: Diagnosis not present

## 2020-10-27 DIAGNOSIS — R278 Other lack of coordination: Secondary | ICD-10-CM | POA: Diagnosis not present

## 2020-10-27 DIAGNOSIS — S065X3S Traumatic subdural hemorrhage with loss of consciousness of 1 hour to 5 hours 59 minutes, sequela: Secondary | ICD-10-CM | POA: Diagnosis not present

## 2020-10-27 DIAGNOSIS — I10 Essential (primary) hypertension: Secondary | ICD-10-CM | POA: Diagnosis not present

## 2020-10-27 DIAGNOSIS — M542 Cervicalgia: Secondary | ICD-10-CM | POA: Diagnosis not present

## 2020-10-27 DIAGNOSIS — R2689 Other abnormalities of gait and mobility: Secondary | ICD-10-CM | POA: Diagnosis not present

## 2020-10-27 DIAGNOSIS — R41840 Attention and concentration deficit: Secondary | ICD-10-CM | POA: Diagnosis not present

## 2020-10-27 DIAGNOSIS — Z9181 History of falling: Secondary | ICD-10-CM | POA: Diagnosis not present

## 2020-10-27 NOTE — Progress Notes (Signed)
[facility] [address] [place]. [place] [address] [phone]       HOSPITAL FOLLOW UP NOTE  [name] Date of Birth:  Sep 23, 1940 Medical Record Number:  [id number]   Reason for Referral:  hospital stroke follow up    SUBJECTIVE:   CHIEF COMPLAINT:  Chief Complaint  Patient presents with   Cerebrovascular Accident    Rm Mayes, wife- Herbert Pun, lives at [facility]    HPI:   [name] [name] is a 80 y.o. male with PMHx of HTN, HLD, DM, hx of prior stroke 2015, aortic valve replacement, mitral valve replacement, acute rheumatic pericarditis (age 12/7), diastolic CHF, AVR/MVR and A. fib/A-flutter on Coumadin/Tikosyn who presented on 07/26/2020 after a fall at [place] falling backwards and striking his head - ED eval no acute findings and discharged home. Returned on 5/23 with difficulty speaking and right-sided weakness with imaging now showing large right greater than left traumatic subdural hemorrhage with mass-effect and subfalcine herniation.  Initially poor prognosis with family electing comfort care measures however patient started showing improvement and taken the OR for right craniotomy with evacuation of SDH by Dr. Ronnald Ramp.  Continue to be limited by left greater than right-sided weakness with visual deficits, poor safety awareness, delirium and cognitive deficits.  CIR recommended and discharged on 07/31/2020.  On 6/3, he was found to have decreased LOC with left-sided weakness and right inattention.  Code stroke activated and CTA showed occluded right ICA s/p cerebral angio with revascularization of occluded right ICA terminus. MR brain revealed acute infarct throughout majority of right MCA territory with some sparing of anterior insula and frontal operculum, petechial blood products and mass-effect with 7.5 mm right to left shift.  Mentation initially improving however repeat CT head showed 9 mm midline shift -hypertonic saline initiated.  Repeat CT  head 6/13 slightly increased leftward midline shift measuring 7 mm.  Evaluated by Dr. [name] who felt R ICA occlusion possibly in setting of A. fib with interruption of AC in setting of recent bleed -aspirin 81 mg daily initiated in consideration of initiating DOAC 10 days post stroke.  EF 60 to 65% with severely dilated LA.  LDL 56.  A1c 6.2.  Residual deficits of dysarthria, aphasia, dysphagia (NPO status), and left hemiparesis -discharged back to CIR on 6/11 - 7/14. CIR admission significant for UTI tx'd with Keflex, facial twitching with negative EEG but started on Keppra 500 mg twice daily for seizure prophylaxis, and repeat CT head 6/20 showed decrease edema and Eliquis initiated 6/21.  Progress limited by bouts of lethargy, restlessness and cognitive deficits -family elected SNF for progressive therapy and discharged to [facility] 7/14.   Today, 10/27/2020, [name] is being seen for hospital follow-up accompanied by his wife.  He continues to reside at [facility] currently receiving PT/OT/SLP for residual left hemiparesis, gait impairment/imbalance, dysphagia, cognitive impairment and speech difficulties.  Wife provides majority of history. She does report improvement since discharge.  He is able to ambulate short distance during therapy sessions only otherwise transfers via [place].  Mental status fluctuates.  Swallowing has improved currently on mechanical soft diet and NTL without difficulty and has good appetite.  He is able to feed himself.  She denies any word finding or slurred speech but more issues with vocal intensity.  Currently being followed by [facility] for retention issues with recent use of Foley catheter but is now currently being straight cathed as he was consistently pulling on  catheter tubing. Denies new stroke/TIA symptoms. Per MAR review, remains on Eliquis and atorvastatin.  Also remains on Keppra 500 mg twice daily. No seizure activity noted.  Blood pressure today  120/75. She questions time frame of repeat imaging, medications to help with cognition, and expectations of recovery.  No further concerns at this time.      PERTINENT IMAGING   EEG 08/29/2020 Impression and clinical correlation:  This EEG was obtained while awake and drowsy and is abnormal due to mild diffuse slowing and focal slowing over the left temporal region, reflecting generalized cerebral dysfunction with focal dysfunction superimposed over the left temporal region. Beta frequencies reflect medication effect.   MR BRAIN 08/29/2020 IMPRESSION: 1. No acute or interval finding. 2. Evolving right MCA distribution infarct and bilateral extra-axial hemorrhage. Midline shift to the left measures 3-4 mm.   CT HEAD 08/28/2020 IMPRESSION: Evolving right MCA territory infarct with slightly increased petechial hemorrhage and/or laminar necrosis. No mass occupying acute hemorrhagic transformation. Similar small right cerebral convexity subdural collection. Mass effect appears mildly improved with decreased (now 3 mm) of leftward midline shift at the foramen of [place].   CT HEAD 08/24/2020 IMPRESSION: Evolving right MCA territory infarction and residual right cerebral convexity subdural collection. Mild mass effect persists. No new hemorrhage. No new loss of gray-white differentiation.   CT HEAD 08/22/2020 IMPRESSION: 1. Continued evolution of the right MCA territory infarct, with decreasing sulcal effacement but persistent leftward midline shift. 2. Stable small residual right hemispheric subdural hematoma, measuring less than 5 mm in thickness.   CEREBRAL ANGIO 09/03/2020 IMPRESSION: Status post endovascular complete revascularization of occluded internal carotid artery extra cranially and intracranially, and also of the inferior division of the right middle cerebral artery achieving a TICI 2C revascularization, with 1 pass with a 6 mm x 40 mm Solitaire X retrieval device  and contact aspiration, and Embotrap 6.5 mm x 45 mm retrieval device, and constant aspiration.   CT HEAD 08/17/2020 IMPRESSION: 1. Slightly increased leftward midline shift measuring 7 mm. Otherwise unchanged appearance of right MCA territory infarct. 2. Unchanged small right hemispheric subdural hematoma.  CT HEAD 08/12/2020 IMPRESSION: Evolving large right MCA territory infarction with similar mass effect. Petechial hemorrhage is present without discrete hematoma.   Similar posterior left cerebral convexity mixed density subdural hematoma and remaining mixed density subdural hematoma underlying craniotomy.   No new hemorrhage.   CT HEAD 08/10/2020 IMPRESSION: 1. Decreased mass effect associated with the right MCA territory infarct. 2. Stable bilateral extra-axial collections. 3. Stable left parietal infarct and extra-axial hemorrhage. 4. No new infarct.   CT HEAD 08/09/2020 IMPRESSION: 1. Evolved relatively large right MCA infarcts since 08/07/2020 with increased intracranial mass effect and increased leftward midline shift now 9 mm. 2. No malignant hemorrhagic transformation. Pre-existing right greater than left subdural hematomas and trace IVH.   MR BRAIN MRA HEAD  08/07/2020 IMPRESSION: Confluent acute infarction throughout the majority of the right middle cerebral artery territory, with some sparing of the anterior insula and frontal operculum. Spotty involvement of the right caudate and putamen. Swelling and petechial blood products but no frank hematoma. Mass effect with right-to-left shift of 7.5 mm.   Recent right parietal craniectomy for evacuation of subdural hematoma. Residual subdural blood and fluid on the right, maximal thickness 8 mm. Residual subdural blood in the left parieto-occipital region, maximal thickness 1 cm.   Small amount of blood dependent in the occipital horns of the lateral ventricles without evidence of change in ventricular size.  Small hemorrhagic contusion in the region of subacute infarction in the right parietooccipital junction.   MR angiography negative for large or medium vessel occlusion presently. Luxury perfusion in the MCA branches.   CTA HEAD/NECK 08/07/2020 IMPRESSION: CTA neck:   1. The right internal carotid artery becomes occluded shortly beyond its origin and remains occluded throughout the remainder of the neck. 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 2. The left common carotid and internal carotid arteries are patent within the neck without stenosis. Mild calcified plaque within the left carotid bifurcation. 3. Vertebral arteries patent within the neck. Moderate atherosclerotic narrowing at the origin of the left vertebral artery. 4. The partially imaged ascending thoracic aorta measures up to 4 cm in diameter. Nonemergent CTA of the chest is recommended to more fully assess the degree of acid ending thoracic aortic dilation, and to determine appropriate follow-up.   CTA head:   1. The intracranial right internal carotid artery is occluded with the exception of the very distal right ICA terminus. 2. The M1 right middle cerebral artery is patent, likely due to cross flow via the anterior communicating artery and right A1 segment. However, there is occlusion of a proximal right M2 MCA vessel. 3. Apparent flow gaps within a right PCA P3/P4 branch, which may reflect short-segment occlusions or high-grade stenoses.   CT perfusion head:   1. Please note the reliability of the perfusion data is limited due to bolus quality and motion degradation. 2. The perfusion software identifies a 45 mL core infarct within the right MCA vascular territory. However, the size of the core infarct is likely underestimated when correlating with the concurrently performed noncontrast head CT. 3. The perfusion software identifies a 162 mL region of hypoperfusion predominantly within the  right MCA vascular territory. Reported mismatch volume: 117 mL.   CT HEAD 08/07/2020 IMPRESSION: 1. 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 Asymmetric hyperdensity of the M1 and M2 right middle cerebral artery vessels, likely reflecting endoluminal thrombus. 2. Unchanged subdural hemorrhage overlying the right cerebral hemisphere. 3. Continued interval decrease in conspicuity of interhemispheric subdural and subarachnoid hemorrhage. 4. Unchanged size of an extra-axial hemorrhage overlying the left parietal lobe, again measuring up to 10 mm in thickness. 5. Persistent trace intraventricular hemorrhage. No evidence of hydrocephalus. 6. Unchanged mass effect with 4 mm leftward midline shift. 7. Right maxillary sinusitis.   CT HEAD 08/03/2020 IMPRESSION: Decreasing intracranial hemorrhage.  No new abnormality.   CT HEAD 07/29/2020 IMPRESSION: 1. Significantly decreased size of an acute right subdural hemorrhage status post craniotomy and drainage, as detailed above. Improved mass effect with 5 mm of leftward midline shift. 2. Similar subdural hemorrhage along the falx and right tentorial leaflet, small volume high left frontal parasagittal subarachnoid hemorrhage, small volume intraventricular hemorrhage, and additional 1.1 cm thick extra-axial hemorrhage along the left parietotemporal convexity.   CT HEAD 07/28/2020 IMPRESSION: Persistent bilateral subdural hematomas slightly smaller than that seen on the prior exam with a lesser degree of midline shift when compared with the prior study.   Small amount of intraventricular hemorrhage is noted in the left lateral ventricle.   Atrophic changes and findings of prior left temporoparietal infarct are noted.   CT HEAD 07/27/2020 IMPRESSION: Acute right much larger than left subdural hematomas. Additional small volume subdural  hemorrhage along the falx and tentorium.   Mass effect including subfalcine herniation with leftward midline shift of 1.8 cm  and early trapping of the left lateral ventricle.   CT HEAD 07/26/2020 IMPRESSION: Old left posterior parietal infarct.   No acute intracranial abnormality.       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    Other and unspecified hyperlipidemia    Pre-diabetes    Previous back surgery 1978, jan 2007   Psychosexual dysfunction with inhibited sexual excitement    SOB (shortness of breath)    "with heavy exercise"   Stroke ([facility]) 08/2013   "I WAS IN AFIB AND THREW A CLOT, THE EFFECTS WERE TRANSITORY AND DIDNT LAST BUT FOR 30 MINUTES"    Thoracic aortic aneurysm (HCC)     PSH:  Past Surgical History:  Procedure Laterality Date   AORTIC AND MITRAL VALVE REPLACEMENT     09/2004   CARDIOVERSION     3 times from 2004-2006   CARDIOVERSION N/A 09/26/2013   Procedure: CARDIOVERSION;  Surgeon: Larey Dresser, MD;  Location: [facility];  Service: Cardiovascular;  Laterality: N/A;   CARDIOVERSION N/A 06/19/2014   Procedure: CARDIOVERSION;  Surgeon: Jerline Pain, MD;  Location: [facility];  Service: Cardiovascular;  Laterality: N/A;   CARDIOVERSION N/A 04/24/2017   Procedure: CARDIOVERSION;  Surgeon: Larey Dresser, MD;  Location: [facility];  Service: Cardiovascular;  Laterality: N/A;   CARDIOVERSION N/A 06/08/2017   Procedure:  CARDIOVERSION;  Surgeon: Pixie Casino, MD;  Location: [facility] ENDOSCOPY;  Service: Cardiovascular;  Laterality: N/A;   CARDIOVERSION N/A 08/24/2017   Procedure: CARDIOVERSION;  Surgeon: Skeet Latch, MD;  Location: [facility] ENDOSCOPY;  Service: Cardiovascular;  Laterality: N/A;   CARDIOVERSION N/A 02/14/2018   Procedure: CARDIOVERSION;  Surgeon: Larey Dresser, MD;  Location: [facility] ENDOSCOPY;  Service: Cardiovascular;  Laterality: N/A;   COLONOSCOPY     CRANIOTOMY N/A 07/28/2020   Procedure: CRANIOTOMY FOR EVACUATION OF SUBDURAL HEMATOMA;  Surgeon: Eustace Stock, MD;  Location: [place];  Service: Neurosurgery;  Laterality: N/A;   IR ANGIO VERTEBRAL SEL SUBCLAVIAN INNOMINATE UNI R MOD SED  08/07/2020   IR CT HEAD LTD  08/07/2020   IR PERCUTANEOUS ART THROMBECTOMY/INFUSION INTRACRANIAL INC DIAG ANGIO  08/07/2020   laminectomies     10/1975 and in 03/2005   RADIOLOGY WITH ANESTHESIA N/A 08/07/2020   Procedure: IR WITH ANESTHESIA;  Surgeon: Luanne Bras, MD;  Location: [facility];  Service: Radiology;  Laterality: N/A;   TONSILLECTOMY     1950   TOTAL HIP ARTHROPLASTY Right 04/03/2018   Procedure: TOTAL HIP ARTHROPLASTY ANTERIOR APPROACH;  Surgeon: Paralee Cancel, MD;  Location: [facility];  Service: Orthopedics;  Laterality: Right;  70 mins    Social History:  Social History   Socioeconomic History   Marital status: Married    Spouse name: Herbert Pun   Number of children: 2   Years of education: BS   Highest education level:  Not on file  Occupational History   Occupation: retired  Tobacco Use   Smoking status: Never   Smokeless tobacco: Never  Vaping Use   Vaping Use: Never used  Substance and Sexual Activity   Alcohol use: Never    Comment: wine occasionally   Drug use: Never   Sexual activity: Yes    Partners: Female  Other Topics Concern   Not on file  Social History Narrative   10/27/20 residing at [facility] care center since 09/17/20    [employer] - BS, JD. married - '67. 2 sons -  '74, '77  one son is a Nurse, learning disability for [employer]; 4 grandchildren. 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 marriage in good health. End-of-life: discussed issues and provided packet with the MOST form and out of facility order.   Social Determinants of Health   Financial Resource Strain: Not on file  Food Insecurity: Not on file  Transportation Needs: Not on file  Physical Activity: Not on file  Stress: Not on file  Social Connections: Not on file  Intimate Partner Violence: Not on file    Family History:  Family History  Problem Relation Age of Onset   Macular degeneration Mother        macular degeneration   Cancer Father        intestinal/GI   Diabetes Paternal Aunt    Heart attack Maternal Grandfather    Diabetes Brother     Medications:   Current Outpatient Medications on File Prior to Visit  Medication Sig Dispense Refill   acetaminophen (TYLENOL) 325 MG tablet Take 1-2 tablets (325-650 mg total) by mouth every 4 (four) hours as needed for mild pain.     amantadine (SYMMETREL) 50 MG/5ML solution Take 10 mLs (100 mg total) by mouth 2 (two) times daily. 10 mL 0   apixaban (ELIQUIS) 5 MG TABS tablet Take 1 tablet (5 mg total) by mouth 2 (two) times daily. 60 tablet 0   atorvastatin (LIPITOR) 80 MG tablet Take 1 tablet (80 mg total) by mouth daily.     clobetasol cream (TEMOVATE) [id number] % Apply 1 application topically daily as needed (for skin irritation).      dofetilide (TIKOSYN) 500 MCG capsule Take 1 capsule (500 mcg total) by mouth 2 (two) times daily. 180 capsule 3   famotidine (PEPCID) 20 MG tablet Take 20 mg by mouth daily.     finasteride (PROSCAR) 5 MG tablet Take 5 mg by mouth daily.     hydrocortisone cream 1 % Apply topically 2 (two) times daily as needed for itching (rash). 30 g 0   levETIRAcetam (KEPPRA) 500 MG tablet Take 1 tablet (500 mg total) by mouth 2 (two) times daily. 2 tablet  0   loratadine (CLARITIN) 10 MG tablet Take 10 mg by mouth at bedtime.     losartan (COZAAR) 25 MG tablet Place 1 tablet (25 mg total) into feeding tube daily. 90 tablet 3   magnesium gluconate (MAGONATE) 500 MG tablet Take 500 mg by mouth daily.     metoprolol tartrate (LOPRESSOR) 25 MG tablet Take 1 tablet (25 mg total) by mouth 2 (two) times daily.     polyethylene glycol (MIRALAX / GLYCOLAX) 17 g packet Take 17 g by mouth daily as needed.     polyvinyl alcohol (LIQUIFILM TEARS) 1.4 % ophthalmic solution Place 1 drop into both eyes 2 (two) times daily. 15 mL 0  potassium chloride (KLOR-CON) 20 MEQ packet Take 40 mEq by mouth 2 (two) times daily. 4 packet 0   sennosides-docusate sodium (SENOKOT-S) 8.6-50 MG tablet Take 2 tablets by mouth at bedtime.     tamsulosin (FLOMAX) 0.4 MG CAPS capsule Take 0.4 mg by mouth daily.     traZODone (DESYREL) 100 MG tablet Take 1 tablet (100 mg total) by mouth at bedtime. 1 tablet 0   No current facility-administered medications on file prior to visit.    Allergies:   Allergies  Allergen Reactions   Naproxen Hives   No [facility] Food Allergies Other (See Comments)    Scallops - distress, nausea and vomitting      OBJECTIVE:  Physical Exam  Vitals:   10/27/20 1407  BP: 120/75  Pulse: 82   There is no height or weight on file to calculate BMI. No results found.  General: well developed, well nourished, pleasant elderly Caucasian male, seated, in no evident distress Head: head normocephalic and atraumatic.   Neck: supple with no carotid or supraclavicular bruits Cardiovascular: regular rate and rhythm, no murmurs Musculoskeletal: no deformity Skin:  no rash/petichiae Vascular:  Normal pulses all extremities   Neurologic Exam Mental Status: Lethargic falling asleep easily but could be easily awoken.  Limited verbal output with hypophonia and possible underlying aphasia.  Delayed processing and responses.  Some difficulty following  simple commands although seems more so from lethargy.  Oriented to self but disoriented to place and time. Recent and remote memory impaired. Attention span, concentration and fund of knowledge impaired. Mood and affect flat.  Cranial Nerves: Fundoscopic exam reveals sharp disc margins. Pupils equal, briskly reactive to light.  Difficulty assessing extraocular movements although right gaze preference noted.  Left inattention.  Does not blink to threat on left.  Hearing intact. Facial sensation intact.  Left lower facial weakness.  Tongue and palate moves normally and symmetrically.  Motor: Difficulty fully cooperating with strength testing - moves all 4 extremities spontaneously although left side remains weaker than right side Sensory.:  Reacts to painful stimuli Coordination: UTA Gait and Station: Deferred Reflexes: 1+ and symmetric. Toes downgoing.     NIHSS  13 Modified Rankin  4      ASSESSMENT: [name] [name] is a 80 y.o. year old male with PMHx of atrial fibrillation, aortic valve replacement, mitral valve replacement, hx of stroke (2015, 2/2 a.fib without deficit), HTN, HLD and DM who presented on 5/22 after a fall with imaging unremarkable.  He was d/c'd and return the following day with garbled speech and difficulty moving with imaging now demonstrating very large R SDH and smaller L SDH with mass defect, subfalcine herniation and leftward midline shift with early left lateral ventricle entrapment.  Placed on comfort care due to poor prognosis but due to improvement, underwent craniotomy for evacuation of right SDH.  Warfarin placed on hold.  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 fib with interruption of AC due to recent traumatic SDH.      PLAN:  R MCA stroke Traumatic R>L SDH:  Residual deficit: Left hemiparesis, dysphagia, right gaze preference, cognitive impairment  and aphasia.  Continue current therapies at SNF - discussed with wife due to large size of stroke, likely limited recovery. Has f/u with PMR next month to discuss further.   Repeat CT head for follow up for [facility] Continue Eliquis (apixaban) daily  and atorvastatin for secondary stroke prevention.   Discussed secondary stroke prevention measures and importance of close PCP follow up for aggressive stroke risk factor management. I have gone over the pathophysiology of stroke, warning signs and symptoms, risk factors and their management in some detail with instructions to go to the closest emergency room for symptoms of concern. Seizures, post stroke: Continue Keppra 500 mg twice daily for seizure prophylaxis Atrial fibrillation: Continue Eliquis 5 mg twice daily routinely followed by cardiology - wife plans on scheduling follow-up visit with established cardiologist Dr. [name] HTN: BP goal <130/90.  Stable on current regimen per PCP HLD: LDL goal <70. Recent LDL 56.  Continue atorvastatin per PCP DMII: A1c goal<7.0. Recent A1c 6.2 routinely monitored by PCP.     Follow up in 3 months with Dr. [name] (per wife request) or call earlier if needed   CC:  [place] provider: Dr. [name] PCP: [name], MD    I spent 66 minutes of face-to-face and non-face-to-face time with patient and wife.  This included previsit chart review including extensive review of prolonged hospitalization, lab review, study review, order entry, electronic health record documentation, patient and wife education and prolonged discussion regarding recent stroke and etiology, secondary stroke prevention measures and importance of managing stroke risk factors, residual deficits and answered all questions to patient and wife's satisfaction  Frann Rider, AGNP-BC  [facility] [address] [place], [place] [address]  Phone [phone] Fax [phone] Note: This document was prepared with  digital dictation and possible smart phrase technology. Any transcriptional errors that result from this process are unintentional.

## 2020-10-27 NOTE — Patient Instructions (Signed)
Continue working with therapies for hopeful further recovery  Continue Keppra 500 mg twice daily for seizure prevention  Will plan on repeat imaging of his brain (CT head) to look for improvement since prior imaging on 6/24  Continue Eliquis (apixaban) daily  and atorvastatin  for secondary stroke prevention  Continue to follow up with PCP regarding cholesterol and blood pressure management  Maintain strict control of hypertension with blood pressure goal below 130/90 and cholesterol with LDL cholesterol (bad cholesterol) goal below 70 mg/dL.       Followup in the future with me in 3 months with Dr. [name] or call earlier if needed       Thank you for coming to see [name] [name] at [facility]. I hope we have been able to provide you high quality care today.  You may receive a patient satisfaction survey over the next few weeks. We would appreciate your feedback and comments so that we may continue to improve ourselves and the health of our patients.

## 2020-10-27 NOTE — Telephone Encounter (Signed)
Medicare/BCBS supp order sent to GI. NPR they will reach out to the patient to schedule.

## 2020-10-28 ENCOUNTER — Encounter: Payer: Self-pay | Admitting: Adult Health

## 2020-10-28 DIAGNOSIS — R2681 Unsteadiness on feet: Secondary | ICD-10-CM | POA: Diagnosis not present

## 2020-10-28 DIAGNOSIS — R41840 Attention and concentration deficit: Secondary | ICD-10-CM | POA: Diagnosis not present

## 2020-10-28 DIAGNOSIS — R293 Abnormal posture: Secondary | ICD-10-CM | POA: Diagnosis not present

## 2020-10-28 DIAGNOSIS — R278 Other lack of coordination: Secondary | ICD-10-CM | POA: Diagnosis not present

## 2020-10-28 DIAGNOSIS — R2689 Other abnormalities of gait and mobility: Secondary | ICD-10-CM | POA: Diagnosis not present

## 2020-10-28 DIAGNOSIS — I10 Essential (primary) hypertension: Secondary | ICD-10-CM | POA: Diagnosis not present

## 2020-10-28 DIAGNOSIS — S065X3S Traumatic subdural hemorrhage with loss of consciousness of 1 hour to 5 hours 59 minutes, sequela: Secondary | ICD-10-CM | POA: Diagnosis not present

## 2020-10-28 DIAGNOSIS — N3946 Mixed incontinence: Secondary | ICD-10-CM | POA: Diagnosis not present

## 2020-10-28 DIAGNOSIS — Z9181 History of falling: Secondary | ICD-10-CM | POA: Diagnosis not present

## 2020-10-28 DIAGNOSIS — H538 Other visual disturbances: Secondary | ICD-10-CM | POA: Diagnosis not present

## 2020-10-28 DIAGNOSIS — H53452 Other localized visual field defect, left eye: Secondary | ICD-10-CM | POA: Diagnosis not present

## 2020-10-28 DIAGNOSIS — I6601 Occlusion and stenosis of right middle cerebral artery: Secondary | ICD-10-CM | POA: Diagnosis not present

## 2020-10-28 DIAGNOSIS — M62562 Muscle wasting and atrophy, not elsewhere classified, left lower leg: Secondary | ICD-10-CM | POA: Diagnosis not present

## 2020-10-28 DIAGNOSIS — I69391 Dysphagia following cerebral infarction: Secondary | ICD-10-CM | POA: Diagnosis not present

## 2020-10-28 DIAGNOSIS — M6389 Disorders of muscle in diseases classified elsewhere, multiple sites: Secondary | ICD-10-CM | POA: Diagnosis not present

## 2020-10-28 DIAGNOSIS — R41841 Cognitive communication deficit: Secondary | ICD-10-CM | POA: Diagnosis not present

## 2020-10-28 DIAGNOSIS — M542 Cervicalgia: Secondary | ICD-10-CM | POA: Diagnosis not present

## 2020-10-28 DIAGNOSIS — M62561 Muscle wasting and atrophy, not elsewhere classified, right lower leg: Secondary | ICD-10-CM | POA: Diagnosis not present

## 2020-10-28 NOTE — Progress Notes (Signed)
I agree with the above plan 

## 2020-10-28 NOTE — Telephone Encounter (Signed)
Patient wife called stating that her husband is in a wheelchair and cannot stand.   He is scheduled at [facility] for Monday 11/02/20 to arrive at 3:00 pm for a 3:30 pm appointment. Patient wife is aware of time and day. I also gave her their number of [phone] incase she needed to r/s.

## 2020-10-29 ENCOUNTER — Telehealth: Payer: Self-pay | Admitting: Adult Health

## 2020-10-29 DIAGNOSIS — M62562 Muscle wasting and atrophy, not elsewhere classified, left lower leg: Secondary | ICD-10-CM | POA: Diagnosis not present

## 2020-10-29 DIAGNOSIS — I10 Essential (primary) hypertension: Secondary | ICD-10-CM | POA: Diagnosis not present

## 2020-10-29 DIAGNOSIS — H53452 Other localized visual field defect, left eye: Secondary | ICD-10-CM | POA: Diagnosis not present

## 2020-10-29 DIAGNOSIS — M6389 Disorders of muscle in diseases classified elsewhere, multiple sites: Secondary | ICD-10-CM | POA: Diagnosis not present

## 2020-10-29 DIAGNOSIS — R2689 Other abnormalities of gait and mobility: Secondary | ICD-10-CM | POA: Diagnosis not present

## 2020-10-29 DIAGNOSIS — Z9181 History of falling: Secondary | ICD-10-CM | POA: Diagnosis not present

## 2020-10-29 DIAGNOSIS — I6601 Occlusion and stenosis of right middle cerebral artery: Secondary | ICD-10-CM | POA: Diagnosis not present

## 2020-10-29 DIAGNOSIS — S065X3S Traumatic subdural hemorrhage with loss of consciousness of 1 hour to 5 hours 59 minutes, sequela: Secondary | ICD-10-CM | POA: Diagnosis not present

## 2020-10-29 DIAGNOSIS — N3946 Mixed incontinence: Secondary | ICD-10-CM | POA: Diagnosis not present

## 2020-10-29 DIAGNOSIS — R41841 Cognitive communication deficit: Secondary | ICD-10-CM | POA: Diagnosis not present

## 2020-10-29 DIAGNOSIS — H538 Other visual disturbances: Secondary | ICD-10-CM | POA: Diagnosis not present

## 2020-10-29 DIAGNOSIS — M542 Cervicalgia: Secondary | ICD-10-CM | POA: Diagnosis not present

## 2020-10-29 DIAGNOSIS — R293 Abnormal posture: Secondary | ICD-10-CM | POA: Diagnosis not present

## 2020-10-29 DIAGNOSIS — R41840 Attention and concentration deficit: Secondary | ICD-10-CM | POA: Diagnosis not present

## 2020-10-29 DIAGNOSIS — R2681 Unsteadiness on feet: Secondary | ICD-10-CM | POA: Diagnosis not present

## 2020-10-29 DIAGNOSIS — M62561 Muscle wasting and atrophy, not elsewhere classified, right lower leg: Secondary | ICD-10-CM | POA: Diagnosis not present

## 2020-10-29 DIAGNOSIS — R278 Other lack of coordination: Secondary | ICD-10-CM | POA: Diagnosis not present

## 2020-10-29 DIAGNOSIS — I69391 Dysphagia following cerebral infarction: Secondary | ICD-10-CM | POA: Diagnosis not present

## 2020-10-29 NOTE — Telephone Encounter (Signed)
I agree. Thank you.

## 2020-10-29 NOTE — Telephone Encounter (Signed)
Contacted pt wife back, per DPR, informed her that we do not have any suggestions. He was referred to them because that is there speciality. They will know all the things to do to treat the pt correctly as that is there job as they assist him. She understood and was appreciative for the call.

## 2020-10-29 NOTE — Telephone Encounter (Signed)
Patient wife called stating she wanted some suggestions to tell the speech therapist about doing exercises for cognitive and strengthen his voice.

## 2020-10-30 DIAGNOSIS — I10 Essential (primary) hypertension: Secondary | ICD-10-CM | POA: Diagnosis not present

## 2020-10-30 DIAGNOSIS — H53452 Other localized visual field defect, left eye: Secondary | ICD-10-CM | POA: Diagnosis not present

## 2020-10-30 DIAGNOSIS — I6601 Occlusion and stenosis of right middle cerebral artery: Secondary | ICD-10-CM | POA: Diagnosis not present

## 2020-10-30 DIAGNOSIS — Z9181 History of falling: Secondary | ICD-10-CM | POA: Diagnosis not present

## 2020-10-30 DIAGNOSIS — R41841 Cognitive communication deficit: Secondary | ICD-10-CM | POA: Diagnosis not present

## 2020-10-30 DIAGNOSIS — H538 Other visual disturbances: Secondary | ICD-10-CM | POA: Diagnosis not present

## 2020-10-30 DIAGNOSIS — M6389 Disorders of muscle in diseases classified elsewhere, multiple sites: Secondary | ICD-10-CM | POA: Diagnosis not present

## 2020-10-30 DIAGNOSIS — I69391 Dysphagia following cerebral infarction: Secondary | ICD-10-CM | POA: Diagnosis not present

## 2020-10-30 DIAGNOSIS — S065X3S Traumatic subdural hemorrhage with loss of consciousness of 1 hour to 5 hours 59 minutes, sequela: Secondary | ICD-10-CM | POA: Diagnosis not present

## 2020-10-30 DIAGNOSIS — R293 Abnormal posture: Secondary | ICD-10-CM | POA: Diagnosis not present

## 2020-10-30 DIAGNOSIS — M542 Cervicalgia: Secondary | ICD-10-CM | POA: Diagnosis not present

## 2020-10-30 DIAGNOSIS — R41840 Attention and concentration deficit: Secondary | ICD-10-CM | POA: Diagnosis not present

## 2020-10-30 DIAGNOSIS — N3946 Mixed incontinence: Secondary | ICD-10-CM | POA: Diagnosis not present

## 2020-10-30 DIAGNOSIS — R278 Other lack of coordination: Secondary | ICD-10-CM | POA: Diagnosis not present

## 2020-11-02 ENCOUNTER — Other Ambulatory Visit: Payer: Self-pay

## 2020-11-02 ENCOUNTER — Ambulatory Visit (HOSPITAL_COMMUNITY)
Admission: RE | Admit: 2020-11-02 | Discharge: 2020-11-02 | Disposition: A | Discharge status: Home / Self Care | Payer: Medicare Other | Source: Ambulatory Visit | Attending: Adult Health | Admitting: Adult Health

## 2020-11-02 DIAGNOSIS — I69391 Dysphagia following cerebral infarction: Secondary | ICD-10-CM | POA: Diagnosis not present

## 2020-11-02 DIAGNOSIS — R41840 Attention and concentration deficit: Secondary | ICD-10-CM | POA: Diagnosis not present

## 2020-11-02 DIAGNOSIS — Z87828 Personal history of other (healed) physical injury and trauma: Secondary | ICD-10-CM | POA: Diagnosis not present

## 2020-11-02 DIAGNOSIS — R278 Other lack of coordination: Secondary | ICD-10-CM | POA: Diagnosis not present

## 2020-11-02 DIAGNOSIS — M542 Cervicalgia: Secondary | ICD-10-CM | POA: Diagnosis not present

## 2020-11-02 DIAGNOSIS — Z9181 History of falling: Secondary | ICD-10-CM | POA: Diagnosis not present

## 2020-11-02 DIAGNOSIS — R2681 Unsteadiness on feet: Secondary | ICD-10-CM | POA: Diagnosis not present

## 2020-11-02 DIAGNOSIS — R293 Abnormal posture: Secondary | ICD-10-CM | POA: Diagnosis not present

## 2020-11-02 DIAGNOSIS — I10 Essential (primary) hypertension: Secondary | ICD-10-CM | POA: Diagnosis not present

## 2020-11-02 DIAGNOSIS — M62561 Muscle wasting and atrophy, not elsewhere classified, right lower leg: Secondary | ICD-10-CM | POA: Diagnosis not present

## 2020-11-02 DIAGNOSIS — N3946 Mixed incontinence: Secondary | ICD-10-CM | POA: Diagnosis not present

## 2020-11-02 DIAGNOSIS — M6389 Disorders of muscle in diseases classified elsewhere, multiple sites: Secondary | ICD-10-CM | POA: Diagnosis not present

## 2020-11-02 DIAGNOSIS — I609 Nontraumatic subarachnoid hemorrhage, unspecified: Secondary | ICD-10-CM | POA: Insufficient documentation

## 2020-11-02 DIAGNOSIS — I6601 Occlusion and stenosis of right middle cerebral artery: Secondary | ICD-10-CM | POA: Diagnosis not present

## 2020-11-02 DIAGNOSIS — S065X3S Traumatic subdural hemorrhage with loss of consciousness of 1 hour to 5 hours 59 minutes, sequela: Secondary | ICD-10-CM | POA: Diagnosis not present

## 2020-11-02 DIAGNOSIS — H53452 Other localized visual field defect, left eye: Secondary | ICD-10-CM | POA: Diagnosis not present

## 2020-11-02 DIAGNOSIS — R41841 Cognitive communication deficit: Secondary | ICD-10-CM | POA: Diagnosis not present

## 2020-11-02 DIAGNOSIS — I739 Peripheral vascular disease, unspecified: Secondary | ICD-10-CM | POA: Diagnosis not present

## 2020-11-02 DIAGNOSIS — M62562 Muscle wasting and atrophy, not elsewhere classified, left lower leg: Secondary | ICD-10-CM | POA: Diagnosis not present

## 2020-11-02 DIAGNOSIS — H538 Other visual disturbances: Secondary | ICD-10-CM | POA: Diagnosis not present

## 2020-11-02 DIAGNOSIS — I6200 Nontraumatic subdural hemorrhage, unspecified: Secondary | ICD-10-CM | POA: Diagnosis not present

## 2020-11-02 DIAGNOSIS — R2689 Other abnormalities of gait and mobility: Secondary | ICD-10-CM | POA: Diagnosis not present

## 2020-11-03 ENCOUNTER — Telehealth: Payer: Self-pay | Admitting: Adult Health

## 2020-11-03 DIAGNOSIS — R278 Other lack of coordination: Secondary | ICD-10-CM | POA: Diagnosis not present

## 2020-11-03 DIAGNOSIS — R293 Abnormal posture: Secondary | ICD-10-CM | POA: Diagnosis not present

## 2020-11-03 DIAGNOSIS — M62561 Muscle wasting and atrophy, not elsewhere classified, right lower leg: Secondary | ICD-10-CM | POA: Diagnosis not present

## 2020-11-03 DIAGNOSIS — H53452 Other localized visual field defect, left eye: Secondary | ICD-10-CM | POA: Diagnosis not present

## 2020-11-03 DIAGNOSIS — M6389 Disorders of muscle in diseases classified elsewhere, multiple sites: Secondary | ICD-10-CM | POA: Diagnosis not present

## 2020-11-03 DIAGNOSIS — S065X3S Traumatic subdural hemorrhage with loss of consciousness of 1 hour to 5 hours 59 minutes, sequela: Secondary | ICD-10-CM | POA: Diagnosis not present

## 2020-11-03 DIAGNOSIS — I10 Essential (primary) hypertension: Secondary | ICD-10-CM | POA: Diagnosis not present

## 2020-11-03 DIAGNOSIS — I6601 Occlusion and stenosis of right middle cerebral artery: Secondary | ICD-10-CM | POA: Diagnosis not present

## 2020-11-03 DIAGNOSIS — H538 Other visual disturbances: Secondary | ICD-10-CM | POA: Diagnosis not present

## 2020-11-03 DIAGNOSIS — R2681 Unsteadiness on feet: Secondary | ICD-10-CM | POA: Diagnosis not present

## 2020-11-03 DIAGNOSIS — R41841 Cognitive communication deficit: Secondary | ICD-10-CM | POA: Diagnosis not present

## 2020-11-03 DIAGNOSIS — R41840 Attention and concentration deficit: Secondary | ICD-10-CM | POA: Diagnosis not present

## 2020-11-03 DIAGNOSIS — M62562 Muscle wasting and atrophy, not elsewhere classified, left lower leg: Secondary | ICD-10-CM | POA: Diagnosis not present

## 2020-11-03 DIAGNOSIS — R2689 Other abnormalities of gait and mobility: Secondary | ICD-10-CM | POA: Diagnosis not present

## 2020-11-03 DIAGNOSIS — N3946 Mixed incontinence: Secondary | ICD-10-CM | POA: Diagnosis not present

## 2020-11-03 DIAGNOSIS — I69391 Dysphagia following cerebral infarction: Secondary | ICD-10-CM | POA: Diagnosis not present

## 2020-11-03 DIAGNOSIS — Z9181 History of falling: Secondary | ICD-10-CM | POA: Diagnosis not present

## 2020-11-03 DIAGNOSIS — M542 Cervicalgia: Secondary | ICD-10-CM | POA: Diagnosis not present

## 2020-11-03 NOTE — Telephone Encounter (Signed)
FYI-Pt's wife called wanting to inform the provider that the pt had his CT Scan done.

## 2020-11-04 DIAGNOSIS — I10 Essential (primary) hypertension: Secondary | ICD-10-CM | POA: Diagnosis not present

## 2020-11-04 DIAGNOSIS — R2689 Other abnormalities of gait and mobility: Secondary | ICD-10-CM | POA: Diagnosis not present

## 2020-11-04 DIAGNOSIS — H53452 Other localized visual field defect, left eye: Secondary | ICD-10-CM | POA: Diagnosis not present

## 2020-11-04 DIAGNOSIS — S065X3S Traumatic subdural hemorrhage with loss of consciousness of 1 hour to 5 hours 59 minutes, sequela: Secondary | ICD-10-CM | POA: Diagnosis not present

## 2020-11-04 DIAGNOSIS — N3946 Mixed incontinence: Secondary | ICD-10-CM | POA: Diagnosis not present

## 2020-11-04 DIAGNOSIS — I69391 Dysphagia following cerebral infarction: Secondary | ICD-10-CM | POA: Diagnosis not present

## 2020-11-04 DIAGNOSIS — R278 Other lack of coordination: Secondary | ICD-10-CM | POA: Diagnosis not present

## 2020-11-04 DIAGNOSIS — I6601 Occlusion and stenosis of right middle cerebral artery: Secondary | ICD-10-CM | POA: Diagnosis not present

## 2020-11-04 DIAGNOSIS — H538 Other visual disturbances: Secondary | ICD-10-CM | POA: Diagnosis not present

## 2020-11-04 DIAGNOSIS — R41841 Cognitive communication deficit: Secondary | ICD-10-CM | POA: Diagnosis not present

## 2020-11-04 DIAGNOSIS — R293 Abnormal posture: Secondary | ICD-10-CM | POA: Diagnosis not present

## 2020-11-04 DIAGNOSIS — M6389 Disorders of muscle in diseases classified elsewhere, multiple sites: Secondary | ICD-10-CM | POA: Diagnosis not present

## 2020-11-04 DIAGNOSIS — R2681 Unsteadiness on feet: Secondary | ICD-10-CM | POA: Diagnosis not present

## 2020-11-04 DIAGNOSIS — M62562 Muscle wasting and atrophy, not elsewhere classified, left lower leg: Secondary | ICD-10-CM | POA: Diagnosis not present

## 2020-11-04 DIAGNOSIS — Z9181 History of falling: Secondary | ICD-10-CM | POA: Diagnosis not present

## 2020-11-04 DIAGNOSIS — R41840 Attention and concentration deficit: Secondary | ICD-10-CM | POA: Diagnosis not present

## 2020-11-04 DIAGNOSIS — M542 Cervicalgia: Secondary | ICD-10-CM | POA: Diagnosis not present

## 2020-11-04 DIAGNOSIS — M62561 Muscle wasting and atrophy, not elsewhere classified, right lower leg: Secondary | ICD-10-CM | POA: Diagnosis not present

## 2020-11-04 NOTE — Telephone Encounter (Signed)
This was sent yesterday via MyChart (can be found under results section)

## 2020-11-04 NOTE — Telephone Encounter (Signed)
Called wife, on DPR  informed recent imaging showed improvement compared to prior imaging! It showed decreased size of prior area of bleed and no residual mid line shift (displacement of brain tissue). She stated he sleeps a lot, sometimes shakes. I advised she monitor and let us know of any concerns. Wife  verbalized understanding, appreciation.

## 2020-11-05 ENCOUNTER — Non-Acute Institutional Stay (SKILLED_NURSING_FACILITY): Payer: Medicare Other | Admitting: Adult Health

## 2020-11-05 ENCOUNTER — Encounter: Payer: Self-pay | Admitting: Adult Health

## 2020-11-05 DIAGNOSIS — R5383 Other fatigue: Secondary | ICD-10-CM

## 2020-11-05 DIAGNOSIS — H538 Other visual disturbances: Secondary | ICD-10-CM | POA: Diagnosis not present

## 2020-11-05 DIAGNOSIS — R338 Other retention of urine: Secondary | ICD-10-CM | POA: Diagnosis not present

## 2020-11-05 DIAGNOSIS — E870 Hyperosmolality and hypernatremia: Secondary | ICD-10-CM

## 2020-11-05 DIAGNOSIS — R41841 Cognitive communication deficit: Secondary | ICD-10-CM | POA: Diagnosis not present

## 2020-11-05 DIAGNOSIS — R7989 Other specified abnormal findings of blood chemistry: Secondary | ICD-10-CM | POA: Diagnosis not present

## 2020-11-05 DIAGNOSIS — M542 Cervicalgia: Secondary | ICD-10-CM | POA: Diagnosis not present

## 2020-11-05 DIAGNOSIS — M6389 Disorders of muscle in diseases classified elsewhere, multiple sites: Secondary | ICD-10-CM | POA: Diagnosis not present

## 2020-11-05 DIAGNOSIS — S069X1D Unspecified intracranial injury with loss of consciousness of 30 minutes or less, subsequent encounter: Secondary | ICD-10-CM

## 2020-11-05 DIAGNOSIS — R2681 Unsteadiness on feet: Secondary | ICD-10-CM | POA: Diagnosis not present

## 2020-11-05 DIAGNOSIS — R2689 Other abnormalities of gait and mobility: Secondary | ICD-10-CM | POA: Diagnosis not present

## 2020-11-05 DIAGNOSIS — R293 Abnormal posture: Secondary | ICD-10-CM | POA: Diagnosis not present

## 2020-11-05 DIAGNOSIS — H53452 Other localized visual field defect, left eye: Secondary | ICD-10-CM | POA: Diagnosis not present

## 2020-11-05 DIAGNOSIS — N401 Enlarged prostate with lower urinary tract symptoms: Secondary | ICD-10-CM

## 2020-11-05 DIAGNOSIS — R278 Other lack of coordination: Secondary | ICD-10-CM | POA: Diagnosis not present

## 2020-11-05 DIAGNOSIS — M62561 Muscle wasting and atrophy, not elsewhere classified, right lower leg: Secondary | ICD-10-CM | POA: Diagnosis not present

## 2020-11-05 DIAGNOSIS — I6601 Occlusion and stenosis of right middle cerebral artery: Secondary | ICD-10-CM | POA: Diagnosis not present

## 2020-11-05 DIAGNOSIS — N3946 Mixed incontinence: Secondary | ICD-10-CM | POA: Diagnosis not present

## 2020-11-05 DIAGNOSIS — I69391 Dysphagia following cerebral infarction: Secondary | ICD-10-CM | POA: Diagnosis not present

## 2020-11-05 DIAGNOSIS — S065X3S Traumatic subdural hemorrhage with loss of consciousness of 1 hour to 5 hours 59 minutes, sequela: Secondary | ICD-10-CM | POA: Diagnosis not present

## 2020-11-05 DIAGNOSIS — Z9181 History of falling: Secondary | ICD-10-CM | POA: Diagnosis not present

## 2020-11-05 DIAGNOSIS — R41840 Attention and concentration deficit: Secondary | ICD-10-CM | POA: Diagnosis not present

## 2020-11-05 DIAGNOSIS — M62562 Muscle wasting and atrophy, not elsewhere classified, left lower leg: Secondary | ICD-10-CM | POA: Diagnosis not present

## 2020-11-05 DIAGNOSIS — I10 Essential (primary) hypertension: Secondary | ICD-10-CM | POA: Diagnosis not present

## 2020-11-05 NOTE — Progress Notes (Signed)
Location:  [facility] Room Number: 153-A Place of Service:  SNF [phone]) Provider:  Royal Hawthorn, NP   Patient Care Team: [name], MD as PCP - General (Internal Medicine) [name], MD as Consulting Physician (Ophthalmology) [name], MD (Internal Medicine)  Extended Emergency Contact Information Primary Emergency Contact: [name] Address: [place], [place] [address] [name] of [place] Phone: [phone] Relation: Spouse Secondary Emergency Contact: [name], [place] [address] [name] of [place] Phone: [phone] Relation: Son  Code Status:  Full Code Goals of care: Advanced Directive information Advanced Directives 11/05/2020  Does Patient Have a Medical Advance Directive? No  Type of Advance Directive -  Does patient want to make changes to medical advance directive? -  Copy of [place] in Chart? -  Would patient like information on creating a medical advance directive? No - Patient declined  Pre-existing out of facility DNR order (yellow form or pink MOST form) -     Chief Complaint  Patient presents with   Acute Visit    Lethargic     HPI:  Pt is a 80 y.o. male seen today for an acute visit for lethargy. He is s/p syncope with fall in May of 2022, TBI with SDH with craniotomy, acute right MCA s/p thrombectomy , with weakness dysphagia, and now is in skilled care rehab at [place]. The nurses report that [name] [name] sleeps through the morning and is much more awake in the afternoon. He has not progressed more with therapy this week. He is being catheterized three times daily due to urinary retention with amts <200 and a wet brief. There is blood in the urine. Prior UA did not show UTI. They are contacting urology to see if they can change the order for catheterization to as needed. He was started on flomax and it seems to help. Nursing staff  questioning whether the amantadine is causing him to sleep as he takes it in the morning and at lunch. He also takes trazodone 100 mg for sleep. He is losing weight and eating less due to the lethargy.  He has lost 7 lbs in the past two weeks. 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    Other and unspecified hyperlipidemia    Pre-diabetes    Previous back surgery 1978, jan 2007   Psychosexual dysfunction with inhibited sexual excitement    SOB (shortness of breath)    "with heavy exercise"   Stroke ([place]) 08/2013   "I WAS IN AFIB AND THREW A CLOT, THE EFFECTS WERE TRANSITORY AND DIDNT LAST BUT FOR 30 MINUTES"    Thoracic aortic aneurysm [facility])    Past Surgical History:  Procedure Laterality Date  AORTIC AND MITRAL VALVE REPLACEMENT     09/2004   CARDIOVERSION     3 times from 2004-2006   CARDIOVERSION N/A 09/26/2013   Procedure: CARDIOVERSION;  Surgeon: Larey Dresser, MD;  Location: [place];  Service: Cardiovascular;  Laterality: N/A;   CARDIOVERSION N/A 06/19/2014   Procedure: CARDIOVERSION;  Surgeon: Jerline Pain, MD;  Location: [place];  Service: Cardiovascular;  Laterality: N/A;   CARDIOVERSION N/A 04/24/2017   Procedure: CARDIOVERSION;  Surgeon: Larey Dresser, MD;  Location: [place];  Service: Cardiovascular;  Laterality: N/A;   CARDIOVERSION N/A 06/08/2017   Procedure: CARDIOVERSION;  Surgeon: Pixie Casino, MD;  Location: [facility] ENDOSCOPY;   Service: Cardiovascular;  Laterality: N/A;   CARDIOVERSION N/A 08/24/2017   Procedure: CARDIOVERSION;  Surgeon: Skeet Latch, MD;  Location: [facility] ENDOSCOPY;  Service: Cardiovascular;  Laterality: N/A;   CARDIOVERSION N/A 02/14/2018   Procedure: CARDIOVERSION;  Surgeon: Larey Dresser, MD;  Location: [facility] ENDOSCOPY;  Service: Cardiovascular;  Laterality: N/A;   COLONOSCOPY     CRANIOTOMY N/A 07/28/2020   Procedure: CRANIOTOMY FOR EVACUATION OF SUBDURAL HEMATOMA;  Surgeon: Eustace Lint, MD;  Location: [place];  Service: Neurosurgery;  Laterality: N/A;   IR ANGIO VERTEBRAL SEL SUBCLAVIAN INNOMINATE UNI R MOD SED  08/07/2020   IR CT HEAD LTD  08/07/2020   IR PERCUTANEOUS ART THROMBECTOMY/INFUSION INTRACRANIAL INC DIAG ANGIO  08/07/2020   laminectomies     10/1975 and in 03/2005   RADIOLOGY WITH ANESTHESIA N/A 08/07/2020   Procedure: IR WITH ANESTHESIA;  Surgeon: Luanne Bras, MD;  Location: [place];  Service: Radiology;  Laterality: N/A;   TONSILLECTOMY     1950   TOTAL HIP ARTHROPLASTY Right 04/03/2018   Procedure: TOTAL HIP ARTHROPLASTY ANTERIOR APPROACH;  Surgeon: Paralee Cancel, MD;  Location: [facility];  Service: Orthopedics;  Laterality: Right;  70 mins    Allergies  Allergen Reactions   Naproxen Hives   No [employer] Food Allergies Other (See Comments)    Scallops - distress, nausea and vomitting    Outpatient Encounter Medications as of 11/05/2020  Medication Sig   acetaminophen (TYLENOL) 325 MG tablet Take 650 mg by mouth every 4 (four) hours as needed for mild pain.   amantadine (SYMMETREL) 50 MG/5ML solution Take 10 mLs (100 mg total) by mouth 2 (two) times daily.   apixaban (ELIQUIS) 5 MG TABS tablet Take 1 tablet (5 mg total) by mouth 2 (two) times daily.   atorvastatin (LIPITOR) 80 MG tablet Take 1 tablet (80 mg total) by mouth daily.   clobetasol cream (TEMOVATE) [id number] % Apply 1 application topically daily as needed (for skin irritation).    dofetilide (TIKOSYN) 500 MCG capsule  Take 1 capsule (500 mcg total) by mouth 2 (two) times daily.   famotidine (PEPCID) 20 MG tablet Take 20 mg by mouth daily.   finasteride (PROSCAR) 5 MG tablet Take 5 mg by mouth daily.   hydrALAZINE (APRESOLINE) 10 MG tablet Take 10 mg by mouth every 6 (six) hours as needed (For b/p greater than 150/90).   hydrocortisone cream 1 % Apply topically 2 (two) times daily as needed for itching (rash).   levETIRAcetam (KEPPRA) 500 MG tablet Take 1 tablet (500 mg total) by mouth 2 (two) times daily.   loratadine (CLARITIN) 10 MG tablet Take 10 mg by mouth at bedtime.   losartan (COZAAR) 25 MG tablet Place 1 tablet (25 mg total) into feeding tube daily.   magnesium gluconate (MAGONATE) 500 MG tablet Take  500 mg by mouth daily.   metoprolol tartrate (LOPRESSOR) 25 MG tablet Take 1 tablet (25 mg total) by mouth 2 (two) times daily.   polyethylene glycol (MIRALAX / GLYCOLAX) 17 g packet Take 17 g by mouth daily as needed.   polyvinyl alcohol (LIQUIFILM TEARS) 1.4 % ophthalmic solution Place 1 drop into both eyes 2 (two) times daily.   potassium chloride (KLOR-CON) 20 MEQ packet Take 40 mEq by mouth 2 (two) times daily.   sennosides-docusate sodium (SENOKOT-S) 8.6-50 MG tablet Take 2 tablets by mouth at bedtime.   tamsulosin (FLOMAX) 0.4 MG CAPS capsule Take 0.4 mg by mouth daily.   traZODone (DESYREL) 100 MG tablet Take 1 tablet (100 mg total) by mouth at bedtime.   [DISCONTINUED] acetaminophen (TYLENOL) 325 MG tablet Take 1-2 tablets (325-650 mg total) by mouth every 4 (four) hours as needed for mild pain.   No facility-administered encounter medications on file as of 11/05/2020.    Review of Systems  Constitutional:  Positive for fatigue. Negative for chills, diaphoresis and fever.  Respiratory:  Negative for cough and wheezing.   Cardiovascular:  Negative for chest pain and leg swelling.  Gastrointestinal:  Negative for abdominal pain, constipation, diarrhea, nausea and vomiting.  Genitourinary:   Positive for decreased urine volume and hematuria. Negative for difficulty urinating, dysuria and urgency.  Musculoskeletal:  Positive for gait problem. Negative for back pain, myalgias and neck pain.  Skin:  Negative for rash.  Neurological:  Negative for weakness.  Psychiatric/Behavioral:  Positive for confusion.    Immunization History  Administered Date(s) Administered   Fluad Quad(high Dose 65+) 12/06/2018   Influenza Whole 12/25/2008, 01/05/2010, 12/30/2011   Influenza, High Dose Seasonal PF 12/14/2016   Influenza,inj,Quad PF,6+ Mos 12/19/2013   Influenza,inj,quad, With Preservative 11/22/2017   Influenza-Unspecified 12/14/2015, 12/24/2019   Moderna SARS-COV2 Booster Vaccination 01/02/2020, 06/26/2020   Moderna Sars-Covid-2 Vaccination 04/08/2019, 05/05/2019   Pneumococcal Conjugate-13 12/19/2013   Pneumococcal Polysaccharide-23 09/24/2007, 05/22/2017   Td 12/25/2008   Tdap 03/22/2018   Zoster Recombinat (Shingrix) 07/16/2019, 10/28/2019   Zoster, Live 09/24/2007   Pertinent  Health Maintenance Due  Topic Date Due   FOOT EXAM  Never done   OPHTHALMOLOGY EXAM  Never done   INFLUENZA VACCINE  10/05/2020   HEMOGLOBIN A1C  02/07/2021   PNA vac Low Risk Adult  Completed   Fall Risk  07/11/2019 06/27/2018 05/23/2017 05/22/2017 05/19/2016  Falls in the past year? 0 0 No No No  Number falls in past yr: 0 0 - - -  Injury with Fall? 0 - - - -  Risk for fall due to : Orthopedic patient - - - -  Risk for fall due to: Comment left hip pain; seeing ortho - - - -  Follow up Falls evaluation completed;Education provided - - - -   Functional Status Survey:    Vitals:   11/05/20 1508  BP: 124/83  Pulse: 84  Resp: 18  Temp: 97.8 F (36.6 C)  SpO2: 95%  Weight: 167 lb 6.4 oz (75.9 kg)  Height: '5\' 7"'$  (1.702 m)   Body mass index is 26.22 kg/m. Physical Exam Vitals and nursing note reviewed.  Constitutional:      General: He is not in acute distress.    Appearance: He is not  diaphoretic.  HENT:     Head: Normocephalic and atraumatic.     Mouth/Throat:     Mouth: Mucous membranes are moist.     Pharynx: Oropharynx is clear.  Eyes:  Conjunctiva/sclera: Conjunctivae normal.     Pupils: Pupils are equal, round, and reactive to light.  Neck:     Thyroid: No thyromegaly.     Vascular: No JVD.     Trachea: No tracheal deviation.  Cardiovascular:     Rate and Rhythm: Normal rate. Rhythm irregular.     Heart sounds: No murmur heard. Pulmonary:     Effort: Pulmonary effort is normal. No respiratory distress.     Breath sounds: Normal breath sounds. No wheezing.  Abdominal:     General: Bowel sounds are normal. There is no distension.     Palpations: Abdomen is soft.     Tenderness: There is no abdominal tenderness.  Musculoskeletal:     Right lower leg: No edema.     Left lower leg: No edema.  Lymphadenopathy:     Cervical: No cervical adenopathy.  Skin:    General: Skin is warm and dry.  Neurological:     Mental Status: He is alert.     Comments: Has left sided weakness. Able to intermittently f/c and answer questions. Distracted easily. Alert for the visit     Labs reviewed: Recent Labs    08/09/20 1611 08/09/20 2158 08/12/20 2154 08/13/20 0513 08/13/20 1200 08/14/20 0414 09/11/20 0402 09/15/20 0432 09/17/20 0436 09/17/20 0436 09/21/20 0000 09/29/20 0000 10/13/20 0000 10/16/20 0000  NA 140   < > 152*   < >  --    < > 141 140 140   < > 139 145 147 147  K  --    < > 3.3*   < >  --    < > 4.0 3.9 4.1  --  4.1 4.2 4.0 4.0  CL  --    < > 122*   < >  --    < > 109 107 107  --  105 108 105 109*  CO2  --    < > 26   < >  --    < > '27 27 26  '$ --  23* 23* 23* 23*  GLUCOSE  --    < > 174*   < >  --    < > 109* 107* 128*  --   --   --   --   --   BUN  --    < > 33*   < >  --    < > 27* 20 23   < > '18 19 17 17  '$ CREATININE  --    < > 0.73   < >  --    < > 0.85 0.79 0.85   < > 0.7 0.9 0.8 0.8  CALCIUM  --    < > 7.8*   < >  --    < > 8.7* 9.1 9.1   --  9.2 9.3 9.5 9.3  MG 2.0   < > 2.3   < >  --    < >  --  2.0  --   --  2.0  --   --  1.9  PHOS 2.7  --  1.9*  --  2.2*  --   --   --   --   --   --   --   --   --    < > = values in this interval not displayed.   Recent Labs    07/27/20 1833 08/01/20 0610 08/16/20 0427  AST 34 39 74*  ALT 33 52* 100*  ALKPHOS 65 46 63  BILITOT 0.9 1.1 0.8  PROT 7.0 5.9* 5.2*  ALBUMIN 3.8 2.8* 2.2*   Recent Labs    08/08/20 0625 08/09/20 0451 08/16/20 0427 08/17/20 0340 09/08/20 0344 09/11/20 0402 09/15/20 0432 10/16/20 0000  WBC 17.5* 16.4* 7.9   < > 11.8* 10.8* 11.7* 11.2  NEUTROABS 13.5* 12.6* 5.7  --   --   --   --   --   HGB 12.3* 11.6* 9.0*   < > 11.6* 11.3* 11.8* 11.5*  HCT 36.7* 34.6* 28.2*   < > 35.8* 35.0* 36.3* 34*  MCV 94.1 93.5 98.6   < > 95.7 94.9 94.3  --   PLT 254 229 177   < > 197 189 188 245   < > = values in this interval not displayed.   Lab Results  Component Value Date   TSH 2.35 10/16/2020   Lab Results  Component Value Date   HGBA1C 6.2 (H) 08/08/2020   Lab Results  Component Value Date   CHOL 119 08/08/2020   HDL 42 08/08/2020   LDLCALC 56 08/08/2020   TRIG 105 08/08/2020   CHOLHDL 2.8 08/08/2020    Significant Diagnostic Results in last 30 days:  CT HEAD WO CONTRAST (5MM)  Result Date: 11/03/2020 CLINICAL DATA:  Follow-up subarachnoid hemorrhage EXAM: CT HEAD WITHOUT CONTRAST TECHNIQUE: Contiguous axial images were obtained from the base of the skull through the vertex without intravenous contrast. 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 Previously seen small subdural hematoma again noted but decreased in thickness, measuring 3 mm currently compared to 5 mm previously. No current midline shift. Ventriculomegaly, likely related to ex vacuo dilatation. Atrophy and chronic small vessel disease throughout the deep white matter. Old left parietal infarct, stable. Vascular: No hyperdense vessel or  unexpected calcification. Skull: Prior right craniotomy.  No acute calvarial abnormality. 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 Old left parietal infarct, stable. Small overlying subdural hematoma again noted, decreasing in size since prior study. No midline shift. Atrophy, chronic small vessel disease. Electronically Signed   By: Rolm Baptise M.D.   On: 11/03/2020 11:25    Assessment/Plan  1. Lethargy ? If this is due to elevated sodium vs med vs brain injury Will check labs Consider dose reduction of trazodone at bedtime and/or decreased dose of amantadine Did send message to neurology as they just saw him but also he did tend to sleep in later prior to his brain injury  2. Hypernatremia NA 147  Check CMP Encourage oral fluid  3. Traumatic brain injury On amantadine for activation per hospital See #1  4. Urinary retention due to benign prostatic hyperplasia Would recommend changing the catheterization to prn due to the bleeding. Staff is contacting urology for input.   5. Elevated LFTs Mild Could be due to lipitor Will recheck   Family/ staff Communication: discussed with the nurse Cheri  Labs/tests ordered:  CBC CMP

## 2020-11-06 ENCOUNTER — Encounter: Payer: Self-pay | Admitting: Adult Health

## 2020-11-06 DIAGNOSIS — M62561 Muscle wasting and atrophy, not elsewhere classified, right lower leg: Secondary | ICD-10-CM | POA: Diagnosis not present

## 2020-11-06 DIAGNOSIS — N3946 Mixed incontinence: Secondary | ICD-10-CM | POA: Diagnosis not present

## 2020-11-06 DIAGNOSIS — D720 Genetic anomalies of leukocytes: Secondary | ICD-10-CM | POA: Diagnosis not present

## 2020-11-06 DIAGNOSIS — R2681 Unsteadiness on feet: Secondary | ICD-10-CM | POA: Diagnosis not present

## 2020-11-06 DIAGNOSIS — S065X3S Traumatic subdural hemorrhage with loss of consciousness of 1 hour to 5 hours 59 minutes, sequela: Secondary | ICD-10-CM | POA: Diagnosis not present

## 2020-11-06 DIAGNOSIS — I69391 Dysphagia following cerebral infarction: Secondary | ICD-10-CM | POA: Diagnosis not present

## 2020-11-06 DIAGNOSIS — I10 Essential (primary) hypertension: Secondary | ICD-10-CM | POA: Diagnosis not present

## 2020-11-06 DIAGNOSIS — H538 Other visual disturbances: Secondary | ICD-10-CM | POA: Diagnosis not present

## 2020-11-06 DIAGNOSIS — R41841 Cognitive communication deficit: Secondary | ICD-10-CM | POA: Diagnosis not present

## 2020-11-06 DIAGNOSIS — M6389 Disorders of muscle in diseases classified elsewhere, multiple sites: Secondary | ICD-10-CM | POA: Diagnosis not present

## 2020-11-06 DIAGNOSIS — Z9181 History of falling: Secondary | ICD-10-CM | POA: Diagnosis not present

## 2020-11-06 DIAGNOSIS — R293 Abnormal posture: Secondary | ICD-10-CM | POA: Diagnosis not present

## 2020-11-06 DIAGNOSIS — R41840 Attention and concentration deficit: Secondary | ICD-10-CM | POA: Diagnosis not present

## 2020-11-06 DIAGNOSIS — M62562 Muscle wasting and atrophy, not elsewhere classified, left lower leg: Secondary | ICD-10-CM | POA: Diagnosis not present

## 2020-11-06 DIAGNOSIS — E870 Hyperosmolality and hypernatremia: Secondary | ICD-10-CM | POA: Diagnosis not present

## 2020-11-06 DIAGNOSIS — I6601 Occlusion and stenosis of right middle cerebral artery: Secondary | ICD-10-CM | POA: Diagnosis not present

## 2020-11-06 DIAGNOSIS — R2689 Other abnormalities of gait and mobility: Secondary | ICD-10-CM | POA: Diagnosis not present

## 2020-11-06 DIAGNOSIS — R278 Other lack of coordination: Secondary | ICD-10-CM | POA: Diagnosis not present

## 2020-11-06 DIAGNOSIS — M542 Cervicalgia: Secondary | ICD-10-CM | POA: Diagnosis not present

## 2020-11-06 DIAGNOSIS — H53452 Other localized visual field defect, left eye: Secondary | ICD-10-CM | POA: Diagnosis not present

## 2020-11-06 LAB — BASIC METABOLIC PANEL
BUN: 21 (ref 4–21)
CO2: 25 — AB (ref 13–22)
Chloride: 110 — AB (ref 99–108)
Creatinine: 0.8 (ref 0.6–1.3)
Glucose: 99
Potassium: 4.2 (ref 3.4–5.3)
Sodium: 145 (ref 137–147)

## 2020-11-06 LAB — CBC AND DIFFERENTIAL
HCT: 34 — AB (ref 41–53)
Hemoglobin: 11.7 — AB (ref 13.5–17.5)
Platelets: 201 (ref 150–399)
WBC: 6.9

## 2020-11-06 LAB — HEPATIC FUNCTION PANEL
ALT: 28 (ref 10–40)
AST: 22 (ref 14–40)
Alkaline Phosphatase: 116 (ref 25–125)
Bilirubin, Total: 0.6

## 2020-11-06 LAB — CBC: RBC: 3.94 (ref 3.87–5.11)

## 2020-11-06 LAB — COMPREHENSIVE METABOLIC PANEL: Calcium: 9.3 (ref 8.7–10.7)

## 2020-11-09 DIAGNOSIS — H53452 Other localized visual field defect, left eye: Secondary | ICD-10-CM | POA: Diagnosis not present

## 2020-11-09 DIAGNOSIS — S065X3S Traumatic subdural hemorrhage with loss of consciousness of 1 hour to 5 hours 59 minutes, sequela: Secondary | ICD-10-CM | POA: Diagnosis not present

## 2020-11-09 DIAGNOSIS — M542 Cervicalgia: Secondary | ICD-10-CM | POA: Diagnosis not present

## 2020-11-09 DIAGNOSIS — M62562 Muscle wasting and atrophy, not elsewhere classified, left lower leg: Secondary | ICD-10-CM | POA: Diagnosis not present

## 2020-11-09 DIAGNOSIS — H538 Other visual disturbances: Secondary | ICD-10-CM | POA: Diagnosis not present

## 2020-11-09 DIAGNOSIS — Z9181 History of falling: Secondary | ICD-10-CM | POA: Diagnosis not present

## 2020-11-09 DIAGNOSIS — N3946 Mixed incontinence: Secondary | ICD-10-CM | POA: Diagnosis not present

## 2020-11-09 DIAGNOSIS — M6389 Disorders of muscle in diseases classified elsewhere, multiple sites: Secondary | ICD-10-CM | POA: Diagnosis not present

## 2020-11-09 DIAGNOSIS — R278 Other lack of coordination: Secondary | ICD-10-CM | POA: Diagnosis not present

## 2020-11-09 DIAGNOSIS — M62561 Muscle wasting and atrophy, not elsewhere classified, right lower leg: Secondary | ICD-10-CM | POA: Diagnosis not present

## 2020-11-09 DIAGNOSIS — I69391 Dysphagia following cerebral infarction: Secondary | ICD-10-CM | POA: Diagnosis not present

## 2020-11-09 DIAGNOSIS — R2681 Unsteadiness on feet: Secondary | ICD-10-CM | POA: Diagnosis not present

## 2020-11-09 DIAGNOSIS — R41841 Cognitive communication deficit: Secondary | ICD-10-CM | POA: Diagnosis not present

## 2020-11-09 DIAGNOSIS — I10 Essential (primary) hypertension: Secondary | ICD-10-CM | POA: Diagnosis not present

## 2020-11-09 DIAGNOSIS — R2689 Other abnormalities of gait and mobility: Secondary | ICD-10-CM | POA: Diagnosis not present

## 2020-11-09 DIAGNOSIS — R41840 Attention and concentration deficit: Secondary | ICD-10-CM | POA: Diagnosis not present

## 2020-11-09 DIAGNOSIS — I6601 Occlusion and stenosis of right middle cerebral artery: Secondary | ICD-10-CM | POA: Diagnosis not present

## 2020-11-09 DIAGNOSIS — R293 Abnormal posture: Secondary | ICD-10-CM | POA: Diagnosis not present

## 2020-11-10 ENCOUNTER — Telehealth: Payer: Self-pay

## 2020-11-10 DIAGNOSIS — I6601 Occlusion and stenosis of right middle cerebral artery: Secondary | ICD-10-CM | POA: Diagnosis not present

## 2020-11-10 DIAGNOSIS — Z9181 History of falling: Secondary | ICD-10-CM | POA: Diagnosis not present

## 2020-11-10 DIAGNOSIS — R2689 Other abnormalities of gait and mobility: Secondary | ICD-10-CM | POA: Diagnosis not present

## 2020-11-10 DIAGNOSIS — R41840 Attention and concentration deficit: Secondary | ICD-10-CM | POA: Diagnosis not present

## 2020-11-10 DIAGNOSIS — H538 Other visual disturbances: Secondary | ICD-10-CM | POA: Diagnosis not present

## 2020-11-10 DIAGNOSIS — M542 Cervicalgia: Secondary | ICD-10-CM | POA: Diagnosis not present

## 2020-11-10 DIAGNOSIS — M6389 Disorders of muscle in diseases classified elsewhere, multiple sites: Secondary | ICD-10-CM | POA: Diagnosis not present

## 2020-11-10 DIAGNOSIS — M62561 Muscle wasting and atrophy, not elsewhere classified, right lower leg: Secondary | ICD-10-CM | POA: Diagnosis not present

## 2020-11-10 DIAGNOSIS — I10 Essential (primary) hypertension: Secondary | ICD-10-CM | POA: Diagnosis not present

## 2020-11-10 DIAGNOSIS — N3946 Mixed incontinence: Secondary | ICD-10-CM | POA: Diagnosis not present

## 2020-11-10 DIAGNOSIS — M62562 Muscle wasting and atrophy, not elsewhere classified, left lower leg: Secondary | ICD-10-CM | POA: Diagnosis not present

## 2020-11-10 DIAGNOSIS — R293 Abnormal posture: Secondary | ICD-10-CM | POA: Diagnosis not present

## 2020-11-10 DIAGNOSIS — R278 Other lack of coordination: Secondary | ICD-10-CM | POA: Diagnosis not present

## 2020-11-10 DIAGNOSIS — R2681 Unsteadiness on feet: Secondary | ICD-10-CM | POA: Diagnosis not present

## 2020-11-10 DIAGNOSIS — I69391 Dysphagia following cerebral infarction: Secondary | ICD-10-CM | POA: Diagnosis not present

## 2020-11-10 DIAGNOSIS — R41841 Cognitive communication deficit: Secondary | ICD-10-CM | POA: Diagnosis not present

## 2020-11-10 DIAGNOSIS — H53452 Other localized visual field defect, left eye: Secondary | ICD-10-CM | POA: Diagnosis not present

## 2020-11-10 DIAGNOSIS — S065X3S Traumatic subdural hemorrhage with loss of consciousness of 1 hour to 5 hours 59 minutes, sequela: Secondary | ICD-10-CM | POA: Diagnosis not present

## 2020-11-10 NOTE — Telephone Encounter (Signed)
Wife of patient wants to discuss some things before appt tomorrow.

## 2020-11-10 NOTE — Telephone Encounter (Signed)
Patient wife called and is concerned about some things and she didn't want to talk about it in front of him at his appt tomorrow. She states he is aware of what is going on and what is said but he just may not remember but she still doesn't want to talk in front of him.  Her concerns are his cognition, not as mobile as he was as in the hospital, doesn't talk much. More alert in the evening. Sleeping a lot again. And wanted to let you know a new CT scan done. She is asking for guidance.

## 2020-11-11 ENCOUNTER — Other Ambulatory Visit: Payer: Self-pay

## 2020-11-11 ENCOUNTER — Encounter: Payer: Self-pay | Admitting: Physical Medicine & Rehabilitation

## 2020-11-11 ENCOUNTER — Encounter: Payer: Medicare Other | Attending: Physical Medicine & Rehabilitation | Admitting: Physical Medicine & Rehabilitation

## 2020-11-11 VITALS — BP 120/78 | HR 91 | Ht 67.0 in

## 2020-11-11 DIAGNOSIS — M62561 Muscle wasting and atrophy, not elsewhere classified, right lower leg: Secondary | ICD-10-CM | POA: Diagnosis not present

## 2020-11-11 DIAGNOSIS — R2681 Unsteadiness on feet: Secondary | ICD-10-CM | POA: Diagnosis not present

## 2020-11-11 DIAGNOSIS — H53452 Other localized visual field defect, left eye: Secondary | ICD-10-CM | POA: Diagnosis not present

## 2020-11-11 DIAGNOSIS — Z9181 History of falling: Secondary | ICD-10-CM | POA: Diagnosis not present

## 2020-11-11 DIAGNOSIS — S065X3S Traumatic subdural hemorrhage with loss of consciousness of 1 hour to 5 hours 59 minutes, sequela: Secondary | ICD-10-CM | POA: Diagnosis not present

## 2020-11-11 DIAGNOSIS — N3946 Mixed incontinence: Secondary | ICD-10-CM | POA: Diagnosis not present

## 2020-11-11 DIAGNOSIS — S069X3S Unspecified intracranial injury with loss of consciousness of 1 hour to 5 hours 59 minutes, sequela: Secondary | ICD-10-CM | POA: Diagnosis not present

## 2020-11-11 DIAGNOSIS — I63511 Cerebral infarction due to unspecified occlusion or stenosis of right middle cerebral artery: Secondary | ICD-10-CM | POA: Diagnosis not present

## 2020-11-11 DIAGNOSIS — R2689 Other abnormalities of gait and mobility: Secondary | ICD-10-CM | POA: Diagnosis not present

## 2020-11-11 DIAGNOSIS — M542 Cervicalgia: Secondary | ICD-10-CM | POA: Diagnosis not present

## 2020-11-11 DIAGNOSIS — M62562 Muscle wasting and atrophy, not elsewhere classified, left lower leg: Secondary | ICD-10-CM | POA: Diagnosis not present

## 2020-11-11 DIAGNOSIS — H538 Other visual disturbances: Secondary | ICD-10-CM | POA: Diagnosis not present

## 2020-11-11 DIAGNOSIS — I69391 Dysphagia following cerebral infarction: Secondary | ICD-10-CM | POA: Diagnosis not present

## 2020-11-11 DIAGNOSIS — R41841 Cognitive communication deficit: Secondary | ICD-10-CM | POA: Diagnosis not present

## 2020-11-11 DIAGNOSIS — R41840 Attention and concentration deficit: Secondary | ICD-10-CM | POA: Diagnosis not present

## 2020-11-11 DIAGNOSIS — I6601 Occlusion and stenosis of right middle cerebral artery: Secondary | ICD-10-CM | POA: Diagnosis not present

## 2020-11-11 DIAGNOSIS — R293 Abnormal posture: Secondary | ICD-10-CM | POA: Diagnosis not present

## 2020-11-11 DIAGNOSIS — I10 Essential (primary) hypertension: Secondary | ICD-10-CM | POA: Diagnosis not present

## 2020-11-11 DIAGNOSIS — R278 Other lack of coordination: Secondary | ICD-10-CM | POA: Diagnosis not present

## 2020-11-11 DIAGNOSIS — M6389 Disorders of muscle in diseases classified elsewhere, multiple sites: Secondary | ICD-10-CM | POA: Diagnosis not present

## 2020-11-11 NOTE — Progress Notes (Signed)
Subjective:    Patient ID: [name] [name], male    DOB: 06/30/1940, 80 y.o.   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  He was discharged to [facility] facility in mid July.  He still is needing quite a bit of assistance for mobility and self-care as well as feeding.  He is working with PT, OT, speech therapy.  Speech therapy working his primarily with his swallowing.  He is still on a D3 diet.  He has lost some weight since moving to the facility.  A supplemental shake, Glucerna, was added although he has not managed to put him much weight back on yet.  Appetite is fair at best.  Mrs. Paola is concerned about his cognition and that he is lost some mobility since he left inpatient rehab. He also has lost some weight. He also seems more withdrawn.  He sleeps a lot during the day and then is more alert in the evening.  He had a recent follow-up CT of the head performed which demonstrates expected changes.  He was a bit of a "night owl" prior to coming to the hospital.  I looked at administration of medication times at the facility and there is a large range of when these medications can be given, i.e. 3 hours for most of them.  He is working on basic transferring and standing. Walking is minimal with therapy so far. .    Pain Inventory Average Pain  No Pain Pain Right Now 0 My pain is  No pain  LOCATION OF PAIN  Here Stroke follow up & TBI  BOWEL Number of stools per week: 7 Plus Oral laxative use No  Type of laxative Miralax Enema or suppository use No  History of colostomy No  Incontinent No   BLADDER Normal In and out cath, frequency  only when need Able to self cath Yes  Bladder incontinence Yes  Frequent urination No  Leakage with coughing No  Difficulty starting stream No  Incomplete bladder emptying No    Mobility how many minutes can you walk? Maybe 5 minutes ability  to climb steps?  no do you drive?  no use a wheelchair needs help with transfers Do you have any goals in this area?  yes  Function retired I need assistance with the following:  feeding, dressing, bathing, toileting, meal prep, household duties, and shopping Do you have any goals in this area?  yes  Neuro/Psych weakness tremor tingling trouble walking dizziness confusion  Prior Studies Any changes since last visit?  yes CT/MRI [place]  Physicians involved in your care Any changes since last visit?  no   Family History  Problem Relation Age of Onset   Macular degeneration Mother        macular degeneration   Cancer Father        intestinal/GI   Diabetes Paternal Aunt    Heart attack Maternal Grandfather    Diabetes Brother    Social History   Socioeconomic History   Marital status: Married    Spouse name: Herbert Pun   Number of children: 2   Years of education: BS   Highest education level: Not on file  Occupational History   Occupation: retired  Tobacco Use   Smoking status: Never   Smokeless tobacco: Never  Vaping Use   Vaping Use: Never used  Substance and Sexual Activity   Alcohol  use: Never    Comment: wine occasionally   Drug use: Never   Sexual activity: Yes    Partners: Female  Other Topics Concern   Not on file  Social History Narrative   10/27/20 residing at [facility] care center since 09/17/20    [employer] - BS, JD. married - '67. 2 sons - '74, '77  one son is a Nurse, learning disability for [employer]; 4 grandchildren. 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 marriage in good health. End-of-life: discussed issues and provided packet with the MOST form and out of facility order.   Social Determinants of Health   Financial Resource Strain: Not on file  Food Insecurity: Not on file  Transportation Needs: Not on file  Physical Activity: Not on file  Stress: Not on  file  Social Connections: Not on file   Past Surgical History:  Procedure Laterality Date   AORTIC AND MITRAL VALVE REPLACEMENT     09/2004   CARDIOVERSION     3 times from 2004-2006   CARDIOVERSION N/A 09/26/2013   Procedure: CARDIOVERSION;  Surgeon: Larey Dresser, MD;  Location: [place];  Service: Cardiovascular;  Laterality: N/A;   CARDIOVERSION N/A 06/19/2014   Procedure: CARDIOVERSION;  Surgeon: Jerline Pain, MD;  Location: [place];  Service: Cardiovascular;  Laterality: N/A;   CARDIOVERSION N/A 04/24/2017   Procedure: CARDIOVERSION;  Surgeon: Larey Dresser, MD;  Location: [place];  Service: Cardiovascular;  Laterality: N/A;   CARDIOVERSION N/A 06/08/2017   Procedure: CARDIOVERSION;  Surgeon: Pixie Casino, MD;  Location: [facility] ENDOSCOPY;  Service: Cardiovascular;  Laterality: N/A;   CARDIOVERSION N/A 08/24/2017   Procedure: CARDIOVERSION;  Surgeon: Skeet Latch, MD;  Location: [facility] ENDOSCOPY;  Service: Cardiovascular;  Laterality: N/A;   CARDIOVERSION N/A 02/14/2018   Procedure: CARDIOVERSION;  Surgeon: Larey Dresser, MD;  Location: [facility] ENDOSCOPY;  Service: Cardiovascular;  Laterality: N/A;   COLONOSCOPY     CRANIOTOMY N/A 07/28/2020   Procedure: CRANIOTOMY FOR EVACUATION OF SUBDURAL HEMATOMA;  Surgeon: Eustace Ephraim, MD;  Location: [place];  Service: Neurosurgery;  Laterality: N/A;   IR ANGIO VERTEBRAL SEL SUBCLAVIAN INNOMINATE UNI R MOD SED  08/07/2020   IR CT HEAD LTD  08/07/2020   IR PERCUTANEOUS ART THROMBECTOMY/INFUSION INTRACRANIAL INC DIAG ANGIO  08/07/2020   laminectomies     10/1975 and in 03/2005   RADIOLOGY WITH ANESTHESIA N/A 08/07/2020   Procedure: IR WITH ANESTHESIA;  Surgeon: Luanne Bras, MD;  Location: [place];  Service: Radiology;  Laterality: N/A;   TONSILLECTOMY     1950   TOTAL HIP ARTHROPLASTY Right 04/03/2018   Procedure: TOTAL HIP ARTHROPLASTY ANTERIOR APPROACH;  Surgeon: Paralee Cancel, MD;  Location: [facility];  Service: Orthopedics;   Laterality: Right;  70 mins   Past Medical History:  Diagnosis Date   Acute rheumatic heart disease, unspecified    childhood, age  27 & 60   Acute rheumatic pericarditis    Atrial fibrillation (HCC)    history   CHF (congestive heart failure) (HCC)    Diverticulosis    Dysrhythmia    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   External hemorrhoids without mention of complication    H/O aortic valve replacement    H/O mitral valve replacement    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   Lesion of ulnar nerve    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  Other and unspecified hyperlipidemia    Pre-diabetes    Previous back surgery 1978, jan 2007   Psychosexual dysfunction with inhibited sexual excitement    SOB (shortness of breath)    "with heavy exercise"   Stroke ([place]) 08/2013   "I WAS IN AFIB AND THREW A CLOT, THE EFFECTS WERE TRANSITORY AND DIDNT LAST BUT FOR 30 MINUTES"    Thoracic aortic aneurysm (HCC)    BP 120/78   Pulse 91   Ht '5\' 7"'$  (1.702 m)   SpO2 95%   BMI 26.22 kg/m   Opioid Risk Score:   Fall Risk Score:  `1  Depression screen PHQ 2/9  Depression screen [facility] 2/9 07/11/2019 06/27/2018 05/23/2017 05/22/2017 05/19/2016  Decreased Interest 0 0 0 0 0  Down, Depressed, Hopeless 0 0 0 0 0  PHQ - 2 Score 0 0 0 0 0  Some recent data might be hidden    Review of Systems  Eyes:  Positive for visual disturbance.  Musculoskeletal:  Positive for gait problem.  Neurological:  Positive for speech difficulty and weakness.  All other systems reviewed and are negative.     Objective:   Physical Exam  Gen: no distress, normal appearing HEENT: oral mucosa pink and moist, NCAT Cardio: Reg rate Chest: normal effort, normal rate of breathing Abd: soft, non-distended Ext: no edema Psych: pleasant, normal affect Skin: intact.  Neuro: dense neglect but is moving his left arm for automatic movements.  Speaks in a whisper, sometimes uses lips. Generally awake and then faded  after a few minutes. Mild left sided extensor and flexor tone in lower and upper.  Musculoskeletal: left scm, trap, ls tight. Can be positioned into neutral.      Assessment & Plan:   Medical Problem List and Plan: 1.  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  Speech therapy should be addressing cognition as well as phonation in addition to his swallowing. 2 .Mood: family feels that he's generally positive 3. Sleep/wake, associated lethargy -he was a night owl before   -Needs to have trazodone at 9 PM each night. -Increase amantadine to 200 mg in the morning 100 mg at lunchtime 4. Seizure prophylaxis Keppra, change schedule slightly to 9a and 9pm to coincide with sleeping medication   5. BPH/urine retention -tikosyn, finasteride, flomax per urology -Avoid use of Foley if possible.              Thirty minutes of face to face patient care time were spent during this visit. All questions were encouraged and answered. Follow up with me in 2 mos.

## 2020-11-11 NOTE — Progress Notes (Deleted)
   Subjective:    Patient ID: [name] [name], male    DOB: 05-29-1940, 80 y.o.   MRN: [id number]  HPI   .cpr Review of Systems     Objective:   Physical Exam        Assessment & Plan:

## 2020-11-11 NOTE — Patient Instructions (Signed)
PLEASE FEEL FREE TO CALL OUR OFFICE WITH ANY PROBLEMS OR QUESTIONS ([phone])      

## 2020-11-12 DIAGNOSIS — I6601 Occlusion and stenosis of right middle cerebral artery: Secondary | ICD-10-CM | POA: Diagnosis not present

## 2020-11-12 DIAGNOSIS — M62562 Muscle wasting and atrophy, not elsewhere classified, left lower leg: Secondary | ICD-10-CM | POA: Diagnosis not present

## 2020-11-12 DIAGNOSIS — M6389 Disorders of muscle in diseases classified elsewhere, multiple sites: Secondary | ICD-10-CM | POA: Diagnosis not present

## 2020-11-12 DIAGNOSIS — M62561 Muscle wasting and atrophy, not elsewhere classified, right lower leg: Secondary | ICD-10-CM | POA: Diagnosis not present

## 2020-11-12 DIAGNOSIS — S065X3S Traumatic subdural hemorrhage with loss of consciousness of 1 hour to 5 hours 59 minutes, sequela: Secondary | ICD-10-CM | POA: Diagnosis not present

## 2020-11-12 DIAGNOSIS — H53452 Other localized visual field defect, left eye: Secondary | ICD-10-CM | POA: Diagnosis not present

## 2020-11-12 DIAGNOSIS — R41841 Cognitive communication deficit: Secondary | ICD-10-CM | POA: Diagnosis not present

## 2020-11-12 DIAGNOSIS — I69391 Dysphagia following cerebral infarction: Secondary | ICD-10-CM | POA: Diagnosis not present

## 2020-11-12 DIAGNOSIS — M542 Cervicalgia: Secondary | ICD-10-CM | POA: Diagnosis not present

## 2020-11-12 DIAGNOSIS — I10 Essential (primary) hypertension: Secondary | ICD-10-CM | POA: Diagnosis not present

## 2020-11-12 DIAGNOSIS — Z9181 History of falling: Secondary | ICD-10-CM | POA: Diagnosis not present

## 2020-11-12 DIAGNOSIS — N3946 Mixed incontinence: Secondary | ICD-10-CM | POA: Diagnosis not present

## 2020-11-12 DIAGNOSIS — R293 Abnormal posture: Secondary | ICD-10-CM | POA: Diagnosis not present

## 2020-11-12 DIAGNOSIS — R278 Other lack of coordination: Secondary | ICD-10-CM | POA: Diagnosis not present

## 2020-11-12 DIAGNOSIS — H538 Other visual disturbances: Secondary | ICD-10-CM | POA: Diagnosis not present

## 2020-11-12 DIAGNOSIS — R2681 Unsteadiness on feet: Secondary | ICD-10-CM | POA: Diagnosis not present

## 2020-11-12 DIAGNOSIS — R41840 Attention and concentration deficit: Secondary | ICD-10-CM | POA: Diagnosis not present

## 2020-11-12 DIAGNOSIS — R2689 Other abnormalities of gait and mobility: Secondary | ICD-10-CM | POA: Diagnosis not present

## 2020-11-13 DIAGNOSIS — M542 Cervicalgia: Secondary | ICD-10-CM | POA: Diagnosis not present

## 2020-11-13 DIAGNOSIS — H53452 Other localized visual field defect, left eye: Secondary | ICD-10-CM | POA: Diagnosis not present

## 2020-11-13 DIAGNOSIS — M62561 Muscle wasting and atrophy, not elsewhere classified, right lower leg: Secondary | ICD-10-CM | POA: Diagnosis not present

## 2020-11-13 DIAGNOSIS — R41841 Cognitive communication deficit: Secondary | ICD-10-CM | POA: Diagnosis not present

## 2020-11-13 DIAGNOSIS — M62562 Muscle wasting and atrophy, not elsewhere classified, left lower leg: Secondary | ICD-10-CM | POA: Diagnosis not present

## 2020-11-13 DIAGNOSIS — H538 Other visual disturbances: Secondary | ICD-10-CM | POA: Diagnosis not present

## 2020-11-13 DIAGNOSIS — R278 Other lack of coordination: Secondary | ICD-10-CM | POA: Diagnosis not present

## 2020-11-13 DIAGNOSIS — I69391 Dysphagia following cerebral infarction: Secondary | ICD-10-CM | POA: Diagnosis not present

## 2020-11-13 DIAGNOSIS — I10 Essential (primary) hypertension: Secondary | ICD-10-CM | POA: Diagnosis not present

## 2020-11-13 DIAGNOSIS — R293 Abnormal posture: Secondary | ICD-10-CM | POA: Diagnosis not present

## 2020-11-13 DIAGNOSIS — M6389 Disorders of muscle in diseases classified elsewhere, multiple sites: Secondary | ICD-10-CM | POA: Diagnosis not present

## 2020-11-13 DIAGNOSIS — S065X3S Traumatic subdural hemorrhage with loss of consciousness of 1 hour to 5 hours 59 minutes, sequela: Secondary | ICD-10-CM | POA: Diagnosis not present

## 2020-11-13 DIAGNOSIS — R2681 Unsteadiness on feet: Secondary | ICD-10-CM | POA: Diagnosis not present

## 2020-11-13 DIAGNOSIS — R2689 Other abnormalities of gait and mobility: Secondary | ICD-10-CM | POA: Diagnosis not present

## 2020-11-13 DIAGNOSIS — I6601 Occlusion and stenosis of right middle cerebral artery: Secondary | ICD-10-CM | POA: Diagnosis not present

## 2020-11-13 DIAGNOSIS — N3946 Mixed incontinence: Secondary | ICD-10-CM | POA: Diagnosis not present

## 2020-11-13 DIAGNOSIS — R41840 Attention and concentration deficit: Secondary | ICD-10-CM | POA: Diagnosis not present

## 2020-11-13 DIAGNOSIS — Z9181 History of falling: Secondary | ICD-10-CM | POA: Diagnosis not present

## 2020-11-16 DIAGNOSIS — H53452 Other localized visual field defect, left eye: Secondary | ICD-10-CM | POA: Diagnosis not present

## 2020-11-16 DIAGNOSIS — I6601 Occlusion and stenosis of right middle cerebral artery: Secondary | ICD-10-CM | POA: Diagnosis not present

## 2020-11-16 DIAGNOSIS — Z9181 History of falling: Secondary | ICD-10-CM | POA: Diagnosis not present

## 2020-11-16 DIAGNOSIS — I10 Essential (primary) hypertension: Secondary | ICD-10-CM | POA: Diagnosis not present

## 2020-11-16 DIAGNOSIS — N3946 Mixed incontinence: Secondary | ICD-10-CM | POA: Diagnosis not present

## 2020-11-16 DIAGNOSIS — R293 Abnormal posture: Secondary | ICD-10-CM | POA: Diagnosis not present

## 2020-11-16 DIAGNOSIS — M62562 Muscle wasting and atrophy, not elsewhere classified, left lower leg: Secondary | ICD-10-CM | POA: Diagnosis not present

## 2020-11-16 DIAGNOSIS — R41841 Cognitive communication deficit: Secondary | ICD-10-CM | POA: Diagnosis not present

## 2020-11-16 DIAGNOSIS — I69391 Dysphagia following cerebral infarction: Secondary | ICD-10-CM | POA: Diagnosis not present

## 2020-11-16 DIAGNOSIS — S065X3S Traumatic subdural hemorrhage with loss of consciousness of 1 hour to 5 hours 59 minutes, sequela: Secondary | ICD-10-CM | POA: Diagnosis not present

## 2020-11-16 DIAGNOSIS — M62561 Muscle wasting and atrophy, not elsewhere classified, right lower leg: Secondary | ICD-10-CM | POA: Diagnosis not present

## 2020-11-16 DIAGNOSIS — M6389 Disorders of muscle in diseases classified elsewhere, multiple sites: Secondary | ICD-10-CM | POA: Diagnosis not present

## 2020-11-16 DIAGNOSIS — R278 Other lack of coordination: Secondary | ICD-10-CM | POA: Diagnosis not present

## 2020-11-16 DIAGNOSIS — M542 Cervicalgia: Secondary | ICD-10-CM | POA: Diagnosis not present

## 2020-11-16 DIAGNOSIS — H538 Other visual disturbances: Secondary | ICD-10-CM | POA: Diagnosis not present

## 2020-11-16 DIAGNOSIS — R2681 Unsteadiness on feet: Secondary | ICD-10-CM | POA: Diagnosis not present

## 2020-11-16 DIAGNOSIS — R41840 Attention and concentration deficit: Secondary | ICD-10-CM | POA: Diagnosis not present

## 2020-11-16 DIAGNOSIS — R2689 Other abnormalities of gait and mobility: Secondary | ICD-10-CM | POA: Diagnosis not present

## 2020-11-17 ENCOUNTER — Other Ambulatory Visit (HOSPITAL_BASED_OUTPATIENT_CLINIC_OR_DEPARTMENT_OTHER): Payer: Self-pay

## 2020-11-17 ENCOUNTER — Other Ambulatory Visit: Payer: Self-pay

## 2020-11-17 DIAGNOSIS — R2681 Unsteadiness on feet: Secondary | ICD-10-CM | POA: Diagnosis not present

## 2020-11-17 DIAGNOSIS — M542 Cervicalgia: Secondary | ICD-10-CM | POA: Diagnosis not present

## 2020-11-17 DIAGNOSIS — R41840 Attention and concentration deficit: Secondary | ICD-10-CM | POA: Diagnosis not present

## 2020-11-17 DIAGNOSIS — M6389 Disorders of muscle in diseases classified elsewhere, multiple sites: Secondary | ICD-10-CM | POA: Diagnosis not present

## 2020-11-17 DIAGNOSIS — H53452 Other localized visual field defect, left eye: Secondary | ICD-10-CM | POA: Diagnosis not present

## 2020-11-17 DIAGNOSIS — I10 Essential (primary) hypertension: Secondary | ICD-10-CM | POA: Diagnosis not present

## 2020-11-17 DIAGNOSIS — M62562 Muscle wasting and atrophy, not elsewhere classified, left lower leg: Secondary | ICD-10-CM | POA: Diagnosis not present

## 2020-11-17 DIAGNOSIS — R278 Other lack of coordination: Secondary | ICD-10-CM | POA: Diagnosis not present

## 2020-11-17 DIAGNOSIS — Z9181 History of falling: Secondary | ICD-10-CM | POA: Diagnosis not present

## 2020-11-17 DIAGNOSIS — M62561 Muscle wasting and atrophy, not elsewhere classified, right lower leg: Secondary | ICD-10-CM | POA: Diagnosis not present

## 2020-11-17 DIAGNOSIS — H538 Other visual disturbances: Secondary | ICD-10-CM | POA: Diagnosis not present

## 2020-11-17 DIAGNOSIS — I69391 Dysphagia following cerebral infarction: Secondary | ICD-10-CM | POA: Diagnosis not present

## 2020-11-17 DIAGNOSIS — R2689 Other abnormalities of gait and mobility: Secondary | ICD-10-CM | POA: Diagnosis not present

## 2020-11-17 DIAGNOSIS — R41841 Cognitive communication deficit: Secondary | ICD-10-CM | POA: Diagnosis not present

## 2020-11-17 DIAGNOSIS — S065X3S Traumatic subdural hemorrhage with loss of consciousness of 1 hour to 5 hours 59 minutes, sequela: Secondary | ICD-10-CM | POA: Diagnosis not present

## 2020-11-17 DIAGNOSIS — I6601 Occlusion and stenosis of right middle cerebral artery: Secondary | ICD-10-CM | POA: Diagnosis not present

## 2020-11-17 DIAGNOSIS — N3946 Mixed incontinence: Secondary | ICD-10-CM | POA: Diagnosis not present

## 2020-11-17 DIAGNOSIS — R293 Abnormal posture: Secondary | ICD-10-CM | POA: Diagnosis not present

## 2020-11-17 NOTE — Patient Outreach (Signed)
[facility] [facility]) Care Management  11/17/2020  [name] [name] 03/25/1940 [id number]   First telephone outreach attempt to obtain mRS. No answer. Left message for returned call.  Philmore Pali [facility] Management Assistant [phone]

## 2020-11-18 DIAGNOSIS — R2689 Other abnormalities of gait and mobility: Secondary | ICD-10-CM | POA: Diagnosis not present

## 2020-11-18 DIAGNOSIS — H53452 Other localized visual field defect, left eye: Secondary | ICD-10-CM | POA: Diagnosis not present

## 2020-11-18 DIAGNOSIS — I6601 Occlusion and stenosis of right middle cerebral artery: Secondary | ICD-10-CM | POA: Diagnosis not present

## 2020-11-18 DIAGNOSIS — H538 Other visual disturbances: Secondary | ICD-10-CM | POA: Diagnosis not present

## 2020-11-18 DIAGNOSIS — M62561 Muscle wasting and atrophy, not elsewhere classified, right lower leg: Secondary | ICD-10-CM | POA: Diagnosis not present

## 2020-11-18 DIAGNOSIS — I69391 Dysphagia following cerebral infarction: Secondary | ICD-10-CM | POA: Diagnosis not present

## 2020-11-18 DIAGNOSIS — R278 Other lack of coordination: Secondary | ICD-10-CM | POA: Diagnosis not present

## 2020-11-18 DIAGNOSIS — R41840 Attention and concentration deficit: Secondary | ICD-10-CM | POA: Diagnosis not present

## 2020-11-18 DIAGNOSIS — R2681 Unsteadiness on feet: Secondary | ICD-10-CM | POA: Diagnosis not present

## 2020-11-18 DIAGNOSIS — M62562 Muscle wasting and atrophy, not elsewhere classified, left lower leg: Secondary | ICD-10-CM | POA: Diagnosis not present

## 2020-11-18 DIAGNOSIS — I10 Essential (primary) hypertension: Secondary | ICD-10-CM | POA: Diagnosis not present

## 2020-11-18 DIAGNOSIS — M542 Cervicalgia: Secondary | ICD-10-CM | POA: Diagnosis not present

## 2020-11-18 DIAGNOSIS — R41841 Cognitive communication deficit: Secondary | ICD-10-CM | POA: Diagnosis not present

## 2020-11-18 DIAGNOSIS — S065X3S Traumatic subdural hemorrhage with loss of consciousness of 1 hour to 5 hours 59 minutes, sequela: Secondary | ICD-10-CM | POA: Diagnosis not present

## 2020-11-18 DIAGNOSIS — N3946 Mixed incontinence: Secondary | ICD-10-CM | POA: Diagnosis not present

## 2020-11-18 DIAGNOSIS — Z9181 History of falling: Secondary | ICD-10-CM | POA: Diagnosis not present

## 2020-11-18 DIAGNOSIS — M6389 Disorders of muscle in diseases classified elsewhere, multiple sites: Secondary | ICD-10-CM | POA: Diagnosis not present

## 2020-11-18 DIAGNOSIS — R293 Abnormal posture: Secondary | ICD-10-CM | POA: Diagnosis not present

## 2020-11-19 DIAGNOSIS — H53452 Other localized visual field defect, left eye: Secondary | ICD-10-CM | POA: Diagnosis not present

## 2020-11-19 DIAGNOSIS — Z9181 History of falling: Secondary | ICD-10-CM | POA: Diagnosis not present

## 2020-11-19 DIAGNOSIS — S065X3S Traumatic subdural hemorrhage with loss of consciousness of 1 hour to 5 hours 59 minutes, sequela: Secondary | ICD-10-CM | POA: Diagnosis not present

## 2020-11-19 DIAGNOSIS — R278 Other lack of coordination: Secondary | ICD-10-CM | POA: Diagnosis not present

## 2020-11-19 DIAGNOSIS — R2681 Unsteadiness on feet: Secondary | ICD-10-CM | POA: Diagnosis not present

## 2020-11-19 DIAGNOSIS — R41840 Attention and concentration deficit: Secondary | ICD-10-CM | POA: Diagnosis not present

## 2020-11-19 DIAGNOSIS — I69391 Dysphagia following cerebral infarction: Secondary | ICD-10-CM | POA: Diagnosis not present

## 2020-11-19 DIAGNOSIS — R2689 Other abnormalities of gait and mobility: Secondary | ICD-10-CM | POA: Diagnosis not present

## 2020-11-19 DIAGNOSIS — R41841 Cognitive communication deficit: Secondary | ICD-10-CM | POA: Diagnosis not present

## 2020-11-19 DIAGNOSIS — R293 Abnormal posture: Secondary | ICD-10-CM | POA: Diagnosis not present

## 2020-11-19 DIAGNOSIS — H538 Other visual disturbances: Secondary | ICD-10-CM | POA: Diagnosis not present

## 2020-11-19 DIAGNOSIS — I10 Essential (primary) hypertension: Secondary | ICD-10-CM | POA: Diagnosis not present

## 2020-11-19 DIAGNOSIS — N3946 Mixed incontinence: Secondary | ICD-10-CM | POA: Diagnosis not present

## 2020-11-19 DIAGNOSIS — M6389 Disorders of muscle in diseases classified elsewhere, multiple sites: Secondary | ICD-10-CM | POA: Diagnosis not present

## 2020-11-19 DIAGNOSIS — I6601 Occlusion and stenosis of right middle cerebral artery: Secondary | ICD-10-CM | POA: Diagnosis not present

## 2020-11-19 DIAGNOSIS — M62561 Muscle wasting and atrophy, not elsewhere classified, right lower leg: Secondary | ICD-10-CM | POA: Diagnosis not present

## 2020-11-19 DIAGNOSIS — M62562 Muscle wasting and atrophy, not elsewhere classified, left lower leg: Secondary | ICD-10-CM | POA: Diagnosis not present

## 2020-11-19 DIAGNOSIS — M542 Cervicalgia: Secondary | ICD-10-CM | POA: Diagnosis not present

## 2020-11-20 ENCOUNTER — Other Ambulatory Visit: Payer: Self-pay

## 2020-11-20 DIAGNOSIS — R41841 Cognitive communication deficit: Secondary | ICD-10-CM | POA: Diagnosis not present

## 2020-11-20 DIAGNOSIS — R278 Other lack of coordination: Secondary | ICD-10-CM | POA: Diagnosis not present

## 2020-11-20 DIAGNOSIS — R293 Abnormal posture: Secondary | ICD-10-CM | POA: Diagnosis not present

## 2020-11-20 DIAGNOSIS — R2681 Unsteadiness on feet: Secondary | ICD-10-CM | POA: Diagnosis not present

## 2020-11-20 DIAGNOSIS — R2689 Other abnormalities of gait and mobility: Secondary | ICD-10-CM | POA: Diagnosis not present

## 2020-11-20 DIAGNOSIS — H538 Other visual disturbances: Secondary | ICD-10-CM | POA: Diagnosis not present

## 2020-11-20 DIAGNOSIS — I69391 Dysphagia following cerebral infarction: Secondary | ICD-10-CM | POA: Diagnosis not present

## 2020-11-20 DIAGNOSIS — I10 Essential (primary) hypertension: Secondary | ICD-10-CM | POA: Diagnosis not present

## 2020-11-20 DIAGNOSIS — I6601 Occlusion and stenosis of right middle cerebral artery: Secondary | ICD-10-CM | POA: Diagnosis not present

## 2020-11-20 DIAGNOSIS — M62561 Muscle wasting and atrophy, not elsewhere classified, right lower leg: Secondary | ICD-10-CM | POA: Diagnosis not present

## 2020-11-20 DIAGNOSIS — S065X3S Traumatic subdural hemorrhage with loss of consciousness of 1 hour to 5 hours 59 minutes, sequela: Secondary | ICD-10-CM | POA: Diagnosis not present

## 2020-11-20 DIAGNOSIS — M62562 Muscle wasting and atrophy, not elsewhere classified, left lower leg: Secondary | ICD-10-CM | POA: Diagnosis not present

## 2020-11-20 DIAGNOSIS — Z9181 History of falling: Secondary | ICD-10-CM | POA: Diagnosis not present

## 2020-11-20 DIAGNOSIS — M542 Cervicalgia: Secondary | ICD-10-CM | POA: Diagnosis not present

## 2020-11-20 DIAGNOSIS — H53452 Other localized visual field defect, left eye: Secondary | ICD-10-CM | POA: Diagnosis not present

## 2020-11-20 DIAGNOSIS — R41840 Attention and concentration deficit: Secondary | ICD-10-CM | POA: Diagnosis not present

## 2020-11-20 DIAGNOSIS — N3946 Mixed incontinence: Secondary | ICD-10-CM | POA: Diagnosis not present

## 2020-11-20 DIAGNOSIS — M6389 Disorders of muscle in diseases classified elsewhere, multiple sites: Secondary | ICD-10-CM | POA: Diagnosis not present

## 2020-11-20 NOTE — Patient Outreach (Signed)
[name] [name] [name] [name]) Care Management  11/20/2020  [name] [name] 1940/10/31 [id number]   Telephone outreach to patient to obtain mRS was successfully completed. MRS= 5  Thank you, [place] Care Management Assistant

## 2020-11-23 DIAGNOSIS — H538 Other visual disturbances: Secondary | ICD-10-CM | POA: Diagnosis not present

## 2020-11-23 DIAGNOSIS — M542 Cervicalgia: Secondary | ICD-10-CM | POA: Diagnosis not present

## 2020-11-23 DIAGNOSIS — I6601 Occlusion and stenosis of right middle cerebral artery: Secondary | ICD-10-CM | POA: Diagnosis not present

## 2020-11-23 DIAGNOSIS — R41841 Cognitive communication deficit: Secondary | ICD-10-CM | POA: Diagnosis not present

## 2020-11-23 DIAGNOSIS — R2689 Other abnormalities of gait and mobility: Secondary | ICD-10-CM | POA: Diagnosis not present

## 2020-11-23 DIAGNOSIS — H53452 Other localized visual field defect, left eye: Secondary | ICD-10-CM | POA: Diagnosis not present

## 2020-11-23 DIAGNOSIS — R293 Abnormal posture: Secondary | ICD-10-CM | POA: Diagnosis not present

## 2020-11-23 DIAGNOSIS — R41840 Attention and concentration deficit: Secondary | ICD-10-CM | POA: Diagnosis not present

## 2020-11-23 DIAGNOSIS — S065X3S Traumatic subdural hemorrhage with loss of consciousness of 1 hour to 5 hours 59 minutes, sequela: Secondary | ICD-10-CM | POA: Diagnosis not present

## 2020-11-23 DIAGNOSIS — N3946 Mixed incontinence: Secondary | ICD-10-CM | POA: Diagnosis not present

## 2020-11-23 DIAGNOSIS — R278 Other lack of coordination: Secondary | ICD-10-CM | POA: Diagnosis not present

## 2020-11-23 DIAGNOSIS — I69391 Dysphagia following cerebral infarction: Secondary | ICD-10-CM | POA: Diagnosis not present

## 2020-11-23 DIAGNOSIS — M62561 Muscle wasting and atrophy, not elsewhere classified, right lower leg: Secondary | ICD-10-CM | POA: Diagnosis not present

## 2020-11-23 DIAGNOSIS — M6389 Disorders of muscle in diseases classified elsewhere, multiple sites: Secondary | ICD-10-CM | POA: Diagnosis not present

## 2020-11-23 DIAGNOSIS — I10 Essential (primary) hypertension: Secondary | ICD-10-CM | POA: Diagnosis not present

## 2020-11-23 DIAGNOSIS — Z9181 History of falling: Secondary | ICD-10-CM | POA: Diagnosis not present

## 2020-11-23 DIAGNOSIS — R2681 Unsteadiness on feet: Secondary | ICD-10-CM | POA: Diagnosis not present

## 2020-11-23 DIAGNOSIS — M62562 Muscle wasting and atrophy, not elsewhere classified, left lower leg: Secondary | ICD-10-CM | POA: Diagnosis not present

## 2020-11-24 DIAGNOSIS — R293 Abnormal posture: Secondary | ICD-10-CM | POA: Diagnosis not present

## 2020-11-24 DIAGNOSIS — M6389 Disorders of muscle in diseases classified elsewhere, multiple sites: Secondary | ICD-10-CM | POA: Diagnosis not present

## 2020-11-24 DIAGNOSIS — H538 Other visual disturbances: Secondary | ICD-10-CM | POA: Diagnosis not present

## 2020-11-24 DIAGNOSIS — R41841 Cognitive communication deficit: Secondary | ICD-10-CM | POA: Diagnosis not present

## 2020-11-24 DIAGNOSIS — I6601 Occlusion and stenosis of right middle cerebral artery: Secondary | ICD-10-CM | POA: Diagnosis not present

## 2020-11-24 DIAGNOSIS — M62562 Muscle wasting and atrophy, not elsewhere classified, left lower leg: Secondary | ICD-10-CM | POA: Diagnosis not present

## 2020-11-24 DIAGNOSIS — R278 Other lack of coordination: Secondary | ICD-10-CM | POA: Diagnosis not present

## 2020-11-24 DIAGNOSIS — H53452 Other localized visual field defect, left eye: Secondary | ICD-10-CM | POA: Diagnosis not present

## 2020-11-24 DIAGNOSIS — R2681 Unsteadiness on feet: Secondary | ICD-10-CM | POA: Diagnosis not present

## 2020-11-24 DIAGNOSIS — N3946 Mixed incontinence: Secondary | ICD-10-CM | POA: Diagnosis not present

## 2020-11-24 DIAGNOSIS — R41840 Attention and concentration deficit: Secondary | ICD-10-CM | POA: Diagnosis not present

## 2020-11-24 DIAGNOSIS — S065X3S Traumatic subdural hemorrhage with loss of consciousness of 1 hour to 5 hours 59 minutes, sequela: Secondary | ICD-10-CM | POA: Diagnosis not present

## 2020-11-24 DIAGNOSIS — R2689 Other abnormalities of gait and mobility: Secondary | ICD-10-CM | POA: Diagnosis not present

## 2020-11-24 DIAGNOSIS — I69391 Dysphagia following cerebral infarction: Secondary | ICD-10-CM | POA: Diagnosis not present

## 2020-11-24 DIAGNOSIS — M62561 Muscle wasting and atrophy, not elsewhere classified, right lower leg: Secondary | ICD-10-CM | POA: Diagnosis not present

## 2020-11-24 DIAGNOSIS — I10 Essential (primary) hypertension: Secondary | ICD-10-CM | POA: Diagnosis not present

## 2020-11-24 DIAGNOSIS — M542 Cervicalgia: Secondary | ICD-10-CM | POA: Diagnosis not present

## 2020-11-24 DIAGNOSIS — Z9181 History of falling: Secondary | ICD-10-CM | POA: Diagnosis not present

## 2020-11-25 DIAGNOSIS — H53452 Other localized visual field defect, left eye: Secondary | ICD-10-CM | POA: Diagnosis not present

## 2020-11-25 DIAGNOSIS — M62561 Muscle wasting and atrophy, not elsewhere classified, right lower leg: Secondary | ICD-10-CM | POA: Diagnosis not present

## 2020-11-25 DIAGNOSIS — I6601 Occlusion and stenosis of right middle cerebral artery: Secondary | ICD-10-CM | POA: Diagnosis not present

## 2020-11-25 DIAGNOSIS — I10 Essential (primary) hypertension: Secondary | ICD-10-CM | POA: Diagnosis not present

## 2020-11-25 DIAGNOSIS — I69391 Dysphagia following cerebral infarction: Secondary | ICD-10-CM | POA: Diagnosis not present

## 2020-11-25 DIAGNOSIS — M6389 Disorders of muscle in diseases classified elsewhere, multiple sites: Secondary | ICD-10-CM | POA: Diagnosis not present

## 2020-11-25 DIAGNOSIS — R278 Other lack of coordination: Secondary | ICD-10-CM | POA: Diagnosis not present

## 2020-11-25 DIAGNOSIS — R2689 Other abnormalities of gait and mobility: Secondary | ICD-10-CM | POA: Diagnosis not present

## 2020-11-25 DIAGNOSIS — S065X3S Traumatic subdural hemorrhage with loss of consciousness of 1 hour to 5 hours 59 minutes, sequela: Secondary | ICD-10-CM | POA: Diagnosis not present

## 2020-11-25 DIAGNOSIS — Z9181 History of falling: Secondary | ICD-10-CM | POA: Diagnosis not present

## 2020-11-25 DIAGNOSIS — R41840 Attention and concentration deficit: Secondary | ICD-10-CM | POA: Diagnosis not present

## 2020-11-25 DIAGNOSIS — N3946 Mixed incontinence: Secondary | ICD-10-CM | POA: Diagnosis not present

## 2020-11-25 DIAGNOSIS — R293 Abnormal posture: Secondary | ICD-10-CM | POA: Diagnosis not present

## 2020-11-25 DIAGNOSIS — R2681 Unsteadiness on feet: Secondary | ICD-10-CM | POA: Diagnosis not present

## 2020-11-25 DIAGNOSIS — M62562 Muscle wasting and atrophy, not elsewhere classified, left lower leg: Secondary | ICD-10-CM | POA: Diagnosis not present

## 2020-11-25 DIAGNOSIS — H538 Other visual disturbances: Secondary | ICD-10-CM | POA: Diagnosis not present

## 2020-11-25 DIAGNOSIS — R41841 Cognitive communication deficit: Secondary | ICD-10-CM | POA: Diagnosis not present

## 2020-11-25 DIAGNOSIS — M542 Cervicalgia: Secondary | ICD-10-CM | POA: Diagnosis not present

## 2020-11-26 ENCOUNTER — Non-Acute Institutional Stay (SKILLED_NURSING_FACILITY): Payer: Medicare Other | Admitting: Adult Health

## 2020-11-26 ENCOUNTER — Encounter: Payer: Self-pay | Admitting: Adult Health

## 2020-11-26 DIAGNOSIS — R293 Abnormal posture: Secondary | ICD-10-CM | POA: Diagnosis not present

## 2020-11-26 DIAGNOSIS — Z9181 History of falling: Secondary | ICD-10-CM | POA: Diagnosis not present

## 2020-11-26 DIAGNOSIS — E118 Type 2 diabetes mellitus with unspecified complications: Secondary | ICD-10-CM | POA: Diagnosis not present

## 2020-11-26 DIAGNOSIS — S069X3S Unspecified intracranial injury with loss of consciousness of 1 hour to 5 hours 59 minutes, sequela: Secondary | ICD-10-CM

## 2020-11-26 DIAGNOSIS — I69391 Dysphagia following cerebral infarction: Secondary | ICD-10-CM | POA: Diagnosis not present

## 2020-11-26 DIAGNOSIS — R2689 Other abnormalities of gait and mobility: Secondary | ICD-10-CM | POA: Diagnosis not present

## 2020-11-26 DIAGNOSIS — N401 Enlarged prostate with lower urinary tract symptoms: Secondary | ICD-10-CM | POA: Diagnosis not present

## 2020-11-26 DIAGNOSIS — R1312 Dysphagia, oropharyngeal phase: Secondary | ICD-10-CM | POA: Diagnosis not present

## 2020-11-26 DIAGNOSIS — R41840 Attention and concentration deficit: Secondary | ICD-10-CM | POA: Diagnosis not present

## 2020-11-26 DIAGNOSIS — I6601 Occlusion and stenosis of right middle cerebral artery: Secondary | ICD-10-CM | POA: Diagnosis not present

## 2020-11-26 DIAGNOSIS — H538 Other visual disturbances: Secondary | ICD-10-CM | POA: Diagnosis not present

## 2020-11-26 DIAGNOSIS — N138 Other obstructive and reflux uropathy: Secondary | ICD-10-CM | POA: Diagnosis not present

## 2020-11-26 DIAGNOSIS — H53452 Other localized visual field defect, left eye: Secondary | ICD-10-CM | POA: Diagnosis not present

## 2020-11-26 DIAGNOSIS — M542 Cervicalgia: Secondary | ICD-10-CM | POA: Diagnosis not present

## 2020-11-26 DIAGNOSIS — R634 Abnormal weight loss: Secondary | ICD-10-CM

## 2020-11-26 DIAGNOSIS — I10 Essential (primary) hypertension: Secondary | ICD-10-CM | POA: Diagnosis not present

## 2020-11-26 DIAGNOSIS — I482 Chronic atrial fibrillation, unspecified: Secondary | ICD-10-CM

## 2020-11-26 DIAGNOSIS — M6389 Disorders of muscle in diseases classified elsewhere, multiple sites: Secondary | ICD-10-CM | POA: Diagnosis not present

## 2020-11-26 DIAGNOSIS — M62562 Muscle wasting and atrophy, not elsewhere classified, left lower leg: Secondary | ICD-10-CM | POA: Diagnosis not present

## 2020-11-26 DIAGNOSIS — N3946 Mixed incontinence: Secondary | ICD-10-CM | POA: Diagnosis not present

## 2020-11-26 DIAGNOSIS — M62561 Muscle wasting and atrophy, not elsewhere classified, right lower leg: Secondary | ICD-10-CM | POA: Diagnosis not present

## 2020-11-26 DIAGNOSIS — S065X3S Traumatic subdural hemorrhage with loss of consciousness of 1 hour to 5 hours 59 minutes, sequela: Secondary | ICD-10-CM | POA: Diagnosis not present

## 2020-11-26 DIAGNOSIS — R2681 Unsteadiness on feet: Secondary | ICD-10-CM | POA: Diagnosis not present

## 2020-11-26 DIAGNOSIS — R41841 Cognitive communication deficit: Secondary | ICD-10-CM | POA: Diagnosis not present

## 2020-11-26 DIAGNOSIS — R278 Other lack of coordination: Secondary | ICD-10-CM | POA: Diagnosis not present

## 2020-11-26 NOTE — Progress Notes (Signed)
Location:  [facility] Room Number: 115-A Place of Service:  SNF [phone]) Provider:  Royal Hawthorn, NP   Patient Care Team: [name], MD as PCP - General (Internal Medicine) [name], MD as Consulting Physician (Ophthalmology) [name], MD (Internal Medicine)  Extended Emergency Contact Information Primary Emergency Contact: [name] Address: [place], [place] [address] [name] of [place] Phone: [phone] Relation: Spouse Secondary Emergency Contact: [name], [place] [address] [name] of [place] Phone: [phone] Relation: Son  Code Status:  DNR Goals of care: Advanced Directive information Advanced Directives 11/05/2020  Does Patient Have a Medical Advance Directive? No  Type of Advance Directive -  Does patient want to make changes to medical advance directive? -  Copy of [place] in Chart? -  Would patient like information on creating a medical advance directive? No - Patient declined  Pre-existing out of facility DNR order (yellow form or pink MOST form) -     Chief Complaint  Patient presents with   Medical Management of Chronic Issues    Routine visit. Discuss need for foot exam, eye exam,     HPI:  Pt is a 80 y.o. male seen today for medical management of chronic diseases.   He is s/p syncope with fall in May of 2022, TBI with SDH with craniotomy, acute right MCA s/p thrombectomy , with weakness dysphagia, and now is in skilled care rehab at [facility].   DM: CBGS 115-163  Intakes are adequate, did lose some weight since July. Currently on Glucerna. Continues on a mech soft diet with NTL.  Wt Readings from Last 3 Encounters:  11/26/20 166 lb 3.2 oz (75.4 kg)  11/05/20 167 lb 6.4 oz (75.9 kg)  10/20/20 171 lb 6.4 oz (77.7 kg)   He has urinary retention and has intermittent catheterization done if bladder scan shows >200cc of  urine due to BPH. Done twice this week. No urinary symptoms Since he moved to skilled care he has been sleeping more and not performing as well with therapy.  Meds changed due to lethargy which helped prior to the move Amantadine was increased in the morning and trazodone decreased.   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 Old left parietal infarct, stable.   Small overlying subdural hematoma again noted, decreasing in size since prior study. No midline shift.   Atrophy, chronic small vessel disease.  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    Other and unspecified hyperlipidemia    Pre-diabetes    Previous back surgery 1978, jan 2007   Psychosexual dysfunction with inhibited sexual excitement    SOB (shortness of breath)    "with heavy exercise"   Stroke ([place]) 08/2013   "  I WAS IN AFIB AND THREW A CLOT, THE EFFECTS WERE TRANSITORY AND DIDNT LAST BUT FOR 30 MINUTES"    Thoracic aortic aneurysm [facility])    Past Surgical History:  Procedure Laterality Date   AORTIC AND MITRAL VALVE REPLACEMENT     09/2004   CARDIOVERSION     3 times from 2004-2006   CARDIOVERSION N/A 09/26/2013   Procedure: CARDIOVERSION;  Surgeon: Larey Dresser, MD;  Location: [facility];  Service: Cardiovascular;  Laterality: N/A;   CARDIOVERSION N/A 06/19/2014   Procedure: CARDIOVERSION;  Surgeon: Jerline Pain, MD;  Location: [facility];  Service: Cardiovascular;  Laterality:  N/A;   CARDIOVERSION N/A 04/24/2017   Procedure: CARDIOVERSION;  Surgeon: Larey Dresser, MD;  Location: [facility];  Service: Cardiovascular;  Laterality: N/A;   CARDIOVERSION N/A 06/08/2017   Procedure: CARDIOVERSION;  Surgeon: Pixie Casino, MD;  Location[facility] END[facility];  Service: Cardiovascular;  Laterality: N/A;   CARDIOVERSION N/A 08/24/2017   Procedure: CARDIOVERSION;  Surgeon: Skeet Latch, MD;  Location: [facility] ENDOSCOPY;  Service: Cardiovascular;  Laterality: N/A;   CARDIOVERSION N/A 02/14/2018   Procedure: CARDIOVERSION;  Surgeon: Larey Dresser, MD;  Location: [facility] ENDOSCOPY;  Service: Cardiovascular;  Laterality: N/A;   COLONOSCOPY     CRANIOTOMY N/A 07/28/2020   Procedure: CRANIOTOMY FOR EVACUATION OF SUBDURAL HEMATOMA;  Surgeon: Eustace Syverson, MD;  Location: [facility];  Service: Neurosurgery;  Laterality: N/A;   IR ANGIO VERTEBRAL SEL SUBCLAVIAN INNOMINATE UNI R MOD SED  08/07/2020   IR CT HEAD LTD  08/07/2020   IR PERCUTANEOUS ART THROMBECTOMY/INFUSION INTRACRANIAL INC DIAG ANGIO  08/07/2020   laminectomies     10/1975 and in 03/2005   RADIOLOGY WITH ANESTHESIA N/A 08/07/2020   Procedure: IR WITH ANESTHESIA;  Surgeon: Luanne Bras, MD;  Location: [facility];  Service: Radiology;  Laterality: N/A;   TONSILLECTOMY     1950   TOTAL HIP ARTHROPLASTY Right 04/03/2018   Procedure: TOTAL HIP ARTHROPLASTY ANTERIOR APPROACH;  Surgeon: Paralee Cancel, MD;  Location: [facility];  Service: Orthopedics;  Laterality: Right;  70 mins    Allergies  Allergen Reactions   Naproxen Hives   No [facility] Food Allergies Other (See Comments)    Scallops - distress, nausea and vomitting    Outpatient Encounter Medications as of 11/26/2020  Medication Sig   acetaminophen (TYLENOL) 325 MG tablet Take 650 mg by mouth every 4 (four) hours as needed for mild pain.   amantadine (SYMMETREL) 50 MG/5ML solution Take 10 mLs (100 mg total) by mouth 2 (two) times daily.   apixaban (ELIQUIS) 5 MG TABS tablet Take 1  tablet (5 mg total) by mouth 2 (two) times daily.   atorvastatin (LIPITOR) 80 MG tablet Take 1 tablet (80 mg total) by mouth daily.   clobetasol cream (TEMOVATE) 5.00 % Apply 1 application topically daily as needed (for skin irritation).    dofetilide (TIKOSYN) 500 MCG capsule Take 1 capsule (500 mcg total) by mouth 2 (two) times daily.   famotidine (PEPCID) 20 MG tablet Take 20 mg by mouth daily.   finasteride (PROSCAR) 5 MG tablet Take 5 mg by mouth daily.   hydrALAZINE (APRESOLINE) 10 MG tablet Take 10 mg by mouth every 6 (six) hours as needed (For b/p greater than 150/90).   hydrocortisone cream 1 % Apply topically 2 (two) times daily as needed for itching (rash).   levETIRAcetam (KEPPRA) 500 MG tablet Take 1 tablet (500 mg total) by mouth 2 (two) times daily.  loratadine (CLARITIN) 10 MG tablet Take 10 mg by mouth at bedtime.   losartan (COZAAR) 25 MG tablet Place 1 tablet (25 mg total) into feeding tube daily.   magnesium gluconate (MAGONATE) 500 MG tablet Take 500 mg by mouth daily. (Patient not taking: Reported on 11/11/2020)   metoprolol tartrate (LOPRESSOR) 25 MG tablet Take 1 tablet (25 mg total) by mouth 2 (two) times daily.   polyethylene glycol (MIRALAX / GLYCOLAX) 17 g packet Take 17 g by mouth daily as needed.   polyvinyl alcohol (LIQUIFILM TEARS) 1.4 % ophthalmic solution Place 1 drop into both eyes 2 (two) times daily.   potassium chloride (KLOR-CON) 20 MEQ packet Take 40 mEq by mouth 2 (two) times daily.   sennosides-docusate sodium (SENOKOT-S) 8.6-50 MG tablet Take 2 tablets by mouth at bedtime.   tamsulosin (FLOMAX) 0.4 MG CAPS capsule Take 0.4 mg by mouth daily.   traZODone (DESYREL) 100 MG tablet Take 1 tablet (100 mg total) by mouth at bedtime.   No facility-administered encounter medications on file as of 11/26/2020.    Review of Systems  Unable to perform ROS: Patient nonverbal   Immunization History  Administered Date(s) Administered   Fluad Quad(high Dose 65+)  12/06/2018   Influenza Whole 12/25/2008, 01/05/2010, 12/30/2011   Influenza, High Dose Seasonal PF 12/14/2016   Influenza,inj,Quad PF,6+ Mos 12/19/2013   Influenza,inj,quad, With Preservative 11/22/2017   Influenza-Unspecified 12/14/2015, 12/24/2019   Moderna SARS-COV2 Booster Vaccination 01/02/2020, 06/26/2020   Moderna Sars-Covid-2 Vaccination 04/08/2019, 05/05/2019   Pneumococcal Conjugate-13 12/19/2013   Pneumococcal Polysaccharide-23 09/24/2007, 05/22/2017   Td 12/25/2008   Tdap 03/22/2018   Zoster Recombinat (Shingrix) 07/16/2019, 10/28/2019   Zoster, Live 09/24/2007   Pertinent  Health Maintenance Due  Topic Date Due   FOOT EXAM  Never done   OPHTHALMOLOGY EXAM  Never done   INFLUENZA VACCINE  10/05/2020   HEMOGLOBIN A1C  02/07/2021   Fall Risk  11/11/2020 07/11/2019 06/27/2018 05/23/2017 05/22/2017  Falls in the past year? 0 0 0 No No  Number falls in past yr: 0 0 0 - -  Injury with Fall? 0 0 - - -  Risk for fall due to : - Orthopedic patient - - -  Risk for fall due to: Comment - left hip pain; seeing ortho - - -  Follow up - Falls evaluation completed;Education provided - - -   Functional Status Survey:    There were no vitals filed for this visit. There is no height or weight on file to calculate BMI. Physical Exam Vitals and nursing note reviewed.  Constitutional:      General: He is not in acute distress.    Appearance: He is not diaphoretic.     Comments: Sleeping difficult to arouse  HENT:     Head: Normocephalic and atraumatic.     Nose: Nose normal. No congestion.  Neck:     Thyroid: No thyromegaly.     Vascular: No JVD.     Trachea: No tracheal deviation.  Cardiovascular:     Rate and Rhythm: Normal rate and regular rhythm.     Heart sounds: No murmur heard.    Comments: Regular with one skipped beat Pulmonary:     Effort: Pulmonary effort is normal. No respiratory distress.     Breath sounds: Normal breath sounds. No wheezing.  Abdominal:      General: Bowel sounds are normal. There is no distension.     Palpations: Abdomen is soft.     Tenderness: There  is no abdominal tenderness.  Musculoskeletal:     Right lower leg: No edema.     Left lower leg: No edema.     Comments: Decreased ROM to BUE and BLE. Not able to assess strength as he is sleeping  Lymphadenopathy:     Cervical: No cervical adenopathy.  Skin:    General: Skin is warm and dry.     Coloration: Skin is pale.    Labs reviewed: Recent Labs    08/09/20 1611 08/09/20 2158 08/12/20 2154 08/13/20 0513 08/13/20 1200 08/14/20 0414 09/11/20 0402 09/15/20 0432 09/17/20 0436 09/17/20 0436 09/21/20 0000 09/29/20 0000 10/13/20 0000 10/16/20 0000  NA 140   < > 152*   < >  --    < > 141 140 140   < > 139 145 147 147  K  --    < > 3.3*   < >  --    < > 4.0 3.9 4.1  --  4.1 4.2 4.0 4.0  CL  --    < > 122*   < >  --    < > 109 107 107  --  105 108 105 109*  CO2  --    < > 26   < >  --    < > 27 27 26   --  23* 23* 23* 23*  GLUCOSE  --    < > 174*   < >  --    < > 109* 107* 128*  --   --   --   --   --   BUN  --    < > 33*   < >  --    < > 27* 20 23   < > 18 19 17 17   CREATININE  --    < > 0.73   < >  --    < > 0.85 0.79 0.85   < > 0.7 0.9 0.8 0.8  CALCIUM  --    < > 7.8*   < >  --    < > 8.7* 9.1 9.1  --  9.2 9.3 9.5 9.3  MG 2.0   < > 2.3   < >  --    < >  --  2.0  --   --  2.0  --   --  1.9  PHOS 2.7  --  1.9*  --  2.2*  --   --   --   --   --   --   --   --   --    < > = values in this interval not displayed.   Recent Labs    07/27/20 1833 08/01/20 0610 08/16/20 0427  AST 34 39 74*  ALT 33 52* 100*  ALKPHOS 65 46 63  BILITOT 0.9 1.1 0.8  PROT 7.0 5.9* 5.2*  ALBUMIN 3.8 2.8* 2.2*   Recent Labs    08/08/20 0625 08/09/20 0451 08/16/20 0427 08/17/20 0340 09/08/20 0344 09/11/20 0402 09/15/20 0432 10/16/20 0000  WBC 17.5* 16.4* 7.9   < > 11.8* 10.8* 11.7* 11.2  NEUTROABS 13.5* 12.6* 5.7  --   --   --   --   --   HGB 12.3* 11.6* 9.0*   < > 11.6*  11.3* 11.8* 11.5*  HCT 36.7* 34.6* 28.2*   < > 35.8* 35.0* 36.3* 34*  MCV 94.1 93.5 98.6   < > 95.7 94.9 94.3  --   PLT 254 229 177   < >  197 189 188 245   < > = values in this interval not displayed.   Lab Results  Component Value Date   TSH 2.35 10/16/2020   Lab Results  Component Value Date   HGBA1C 6.2 (H) 08/08/2020   Lab Results  Component Value Date   CHOL 119 08/08/2020   HDL 42 08/08/2020   LDLCALC 56 08/08/2020   TRIG 105 08/08/2020   CHOLHDL 2.8 08/08/2020    Significant Diagnostic Results in last 30 days:  CT HEAD WO CONTRAST (5MM)  Result Date: 11/03/2020 CLINICAL DATA:  Follow-up subarachnoid hemorrhage EXAM: CT HEAD WITHOUT CONTRAST TECHNIQUE: Contiguous axial images were obtained from the base of the skull through the vertex without intravenous contrast. 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 Previously seen small subdural hematoma again noted but decreased in thickness, measuring 3 mm currently compared to 5 mm previously. No current midline shift. Ventriculomegaly, likely related to ex vacuo dilatation. Atrophy and chronic small vessel disease throughout the deep white matter. Old left parietal infarct, stable. Vascular: No hyperdense vessel or unexpected calcification. Skull: Prior right craniotomy.  No acute calvarial abnormality. 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 Old left parietal infarct, stable. Small overlying subdural hematoma again noted, decreasing in size since prior study. No midline shift. Atrophy, chronic small vessel disease. Electronically Signed   By: Rolm Baptise M.D.   On: 11/03/2020 11:25    Assessment/Plan  1. Chronic atrial fibrillation (HCC) Regular on exam with one skipped beat His wife said she will follow up with cardiology He has a hx of ablation On tikosyn and lopressor Monitor K and Mg.  On Eliquis (noted  prior SDH)  2. 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  The move has been too much stimulus for him and he will need a few days to adjust  3. Middle cerebral artery embolism, right S/p thrombectomy  Left sided weakness and general weakness.   4. Oropharyngeal dysphagia Continue mech soft diet and NTL4 1:1 supervision   5. Controlled type 2 diabetes mellitus with complication, without long-term current use of insulin (HCC) CBGs are controlled Will decrease to three times weekly   6. Benign prostatic hyperplasia with urinary obstruction Continue Flomax and Proscar Monitor with bladder scans per urology for retention   7. Weight loss TSH ok Glucerna daily Monitor weight weekly for 6 weeks   Family/ staff Communication: discussed with his wife Herbert Pun  Labs/tests ordered:  NA

## 2020-11-27 DIAGNOSIS — R2689 Other abnormalities of gait and mobility: Secondary | ICD-10-CM | POA: Diagnosis not present

## 2020-11-27 DIAGNOSIS — I10 Essential (primary) hypertension: Secondary | ICD-10-CM | POA: Diagnosis not present

## 2020-11-27 DIAGNOSIS — R293 Abnormal posture: Secondary | ICD-10-CM | POA: Diagnosis not present

## 2020-11-27 DIAGNOSIS — M542 Cervicalgia: Secondary | ICD-10-CM | POA: Diagnosis not present

## 2020-11-27 DIAGNOSIS — H538 Other visual disturbances: Secondary | ICD-10-CM | POA: Diagnosis not present

## 2020-11-27 DIAGNOSIS — M6389 Disorders of muscle in diseases classified elsewhere, multiple sites: Secondary | ICD-10-CM | POA: Diagnosis not present

## 2020-11-27 DIAGNOSIS — S065X3S Traumatic subdural hemorrhage with loss of consciousness of 1 hour to 5 hours 59 minutes, sequela: Secondary | ICD-10-CM | POA: Diagnosis not present

## 2020-11-27 DIAGNOSIS — R41840 Attention and concentration deficit: Secondary | ICD-10-CM | POA: Diagnosis not present

## 2020-11-27 DIAGNOSIS — M62561 Muscle wasting and atrophy, not elsewhere classified, right lower leg: Secondary | ICD-10-CM | POA: Diagnosis not present

## 2020-11-27 DIAGNOSIS — M62562 Muscle wasting and atrophy, not elsewhere classified, left lower leg: Secondary | ICD-10-CM | POA: Diagnosis not present

## 2020-11-27 DIAGNOSIS — Z9181 History of falling: Secondary | ICD-10-CM | POA: Diagnosis not present

## 2020-11-27 DIAGNOSIS — R41841 Cognitive communication deficit: Secondary | ICD-10-CM | POA: Diagnosis not present

## 2020-11-27 DIAGNOSIS — R2681 Unsteadiness on feet: Secondary | ICD-10-CM | POA: Diagnosis not present

## 2020-11-27 DIAGNOSIS — I6601 Occlusion and stenosis of right middle cerebral artery: Secondary | ICD-10-CM | POA: Diagnosis not present

## 2020-11-27 DIAGNOSIS — R278 Other lack of coordination: Secondary | ICD-10-CM | POA: Diagnosis not present

## 2020-11-27 DIAGNOSIS — I69391 Dysphagia following cerebral infarction: Secondary | ICD-10-CM | POA: Diagnosis not present

## 2020-11-27 DIAGNOSIS — N3946 Mixed incontinence: Secondary | ICD-10-CM | POA: Diagnosis not present

## 2020-11-27 DIAGNOSIS — H53452 Other localized visual field defect, left eye: Secondary | ICD-10-CM | POA: Diagnosis not present

## 2020-11-30 DIAGNOSIS — I6601 Occlusion and stenosis of right middle cerebral artery: Secondary | ICD-10-CM | POA: Diagnosis not present

## 2020-11-30 DIAGNOSIS — R2689 Other abnormalities of gait and mobility: Secondary | ICD-10-CM | POA: Diagnosis not present

## 2020-11-30 DIAGNOSIS — R41841 Cognitive communication deficit: Secondary | ICD-10-CM | POA: Diagnosis not present

## 2020-11-30 DIAGNOSIS — H53452 Other localized visual field defect, left eye: Secondary | ICD-10-CM | POA: Diagnosis not present

## 2020-11-30 DIAGNOSIS — S065X3S Traumatic subdural hemorrhage with loss of consciousness of 1 hour to 5 hours 59 minutes, sequela: Secondary | ICD-10-CM | POA: Diagnosis not present

## 2020-11-30 DIAGNOSIS — H538 Other visual disturbances: Secondary | ICD-10-CM | POA: Diagnosis not present

## 2020-11-30 DIAGNOSIS — Z9181 History of falling: Secondary | ICD-10-CM | POA: Diagnosis not present

## 2020-11-30 DIAGNOSIS — R278 Other lack of coordination: Secondary | ICD-10-CM | POA: Diagnosis not present

## 2020-11-30 DIAGNOSIS — I69391 Dysphagia following cerebral infarction: Secondary | ICD-10-CM | POA: Diagnosis not present

## 2020-11-30 DIAGNOSIS — R2681 Unsteadiness on feet: Secondary | ICD-10-CM | POA: Diagnosis not present

## 2020-11-30 DIAGNOSIS — M542 Cervicalgia: Secondary | ICD-10-CM | POA: Diagnosis not present

## 2020-11-30 DIAGNOSIS — M62562 Muscle wasting and atrophy, not elsewhere classified, left lower leg: Secondary | ICD-10-CM | POA: Diagnosis not present

## 2020-11-30 DIAGNOSIS — M6389 Disorders of muscle in diseases classified elsewhere, multiple sites: Secondary | ICD-10-CM | POA: Diagnosis not present

## 2020-11-30 DIAGNOSIS — M62561 Muscle wasting and atrophy, not elsewhere classified, right lower leg: Secondary | ICD-10-CM | POA: Diagnosis not present

## 2020-11-30 DIAGNOSIS — I10 Essential (primary) hypertension: Secondary | ICD-10-CM | POA: Diagnosis not present

## 2020-11-30 DIAGNOSIS — N3946 Mixed incontinence: Secondary | ICD-10-CM | POA: Diagnosis not present

## 2020-11-30 DIAGNOSIS — R293 Abnormal posture: Secondary | ICD-10-CM | POA: Diagnosis not present

## 2020-11-30 DIAGNOSIS — R41840 Attention and concentration deficit: Secondary | ICD-10-CM | POA: Diagnosis not present

## 2020-12-01 DIAGNOSIS — R278 Other lack of coordination: Secondary | ICD-10-CM | POA: Diagnosis not present

## 2020-12-01 DIAGNOSIS — M6389 Disorders of muscle in diseases classified elsewhere, multiple sites: Secondary | ICD-10-CM | POA: Diagnosis not present

## 2020-12-01 DIAGNOSIS — R2689 Other abnormalities of gait and mobility: Secondary | ICD-10-CM | POA: Diagnosis not present

## 2020-12-01 DIAGNOSIS — I10 Essential (primary) hypertension: Secondary | ICD-10-CM | POA: Diagnosis not present

## 2020-12-01 DIAGNOSIS — R41840 Attention and concentration deficit: Secondary | ICD-10-CM | POA: Diagnosis not present

## 2020-12-01 DIAGNOSIS — M542 Cervicalgia: Secondary | ICD-10-CM | POA: Diagnosis not present

## 2020-12-01 DIAGNOSIS — R41841 Cognitive communication deficit: Secondary | ICD-10-CM | POA: Diagnosis not present

## 2020-12-01 DIAGNOSIS — M62562 Muscle wasting and atrophy, not elsewhere classified, left lower leg: Secondary | ICD-10-CM | POA: Diagnosis not present

## 2020-12-01 DIAGNOSIS — S065X3S Traumatic subdural hemorrhage with loss of consciousness of 1 hour to 5 hours 59 minutes, sequela: Secondary | ICD-10-CM | POA: Diagnosis not present

## 2020-12-01 DIAGNOSIS — H53452 Other localized visual field defect, left eye: Secondary | ICD-10-CM | POA: Diagnosis not present

## 2020-12-01 DIAGNOSIS — M62561 Muscle wasting and atrophy, not elsewhere classified, right lower leg: Secondary | ICD-10-CM | POA: Diagnosis not present

## 2020-12-01 DIAGNOSIS — R2681 Unsteadiness on feet: Secondary | ICD-10-CM | POA: Diagnosis not present

## 2020-12-01 DIAGNOSIS — R293 Abnormal posture: Secondary | ICD-10-CM | POA: Diagnosis not present

## 2020-12-01 DIAGNOSIS — H538 Other visual disturbances: Secondary | ICD-10-CM | POA: Diagnosis not present

## 2020-12-01 DIAGNOSIS — N3946 Mixed incontinence: Secondary | ICD-10-CM | POA: Diagnosis not present

## 2020-12-01 DIAGNOSIS — I6601 Occlusion and stenosis of right middle cerebral artery: Secondary | ICD-10-CM | POA: Diagnosis not present

## 2020-12-01 DIAGNOSIS — I69391 Dysphagia following cerebral infarction: Secondary | ICD-10-CM | POA: Diagnosis not present

## 2020-12-01 DIAGNOSIS — Z9181 History of falling: Secondary | ICD-10-CM | POA: Diagnosis not present

## 2020-12-02 DIAGNOSIS — Z9181 History of falling: Secondary | ICD-10-CM | POA: Diagnosis not present

## 2020-12-02 DIAGNOSIS — N3946 Mixed incontinence: Secondary | ICD-10-CM | POA: Diagnosis not present

## 2020-12-02 DIAGNOSIS — S065X3S Traumatic subdural hemorrhage with loss of consciousness of 1 hour to 5 hours 59 minutes, sequela: Secondary | ICD-10-CM | POA: Diagnosis not present

## 2020-12-02 DIAGNOSIS — R41841 Cognitive communication deficit: Secondary | ICD-10-CM | POA: Diagnosis not present

## 2020-12-02 DIAGNOSIS — I69391 Dysphagia following cerebral infarction: Secondary | ICD-10-CM | POA: Diagnosis not present

## 2020-12-02 DIAGNOSIS — R293 Abnormal posture: Secondary | ICD-10-CM | POA: Diagnosis not present

## 2020-12-02 DIAGNOSIS — R41840 Attention and concentration deficit: Secondary | ICD-10-CM | POA: Diagnosis not present

## 2020-12-02 DIAGNOSIS — M542 Cervicalgia: Secondary | ICD-10-CM | POA: Diagnosis not present

## 2020-12-02 DIAGNOSIS — H53452 Other localized visual field defect, left eye: Secondary | ICD-10-CM | POA: Diagnosis not present

## 2020-12-02 DIAGNOSIS — I6601 Occlusion and stenosis of right middle cerebral artery: Secondary | ICD-10-CM | POA: Diagnosis not present

## 2020-12-02 DIAGNOSIS — H538 Other visual disturbances: Secondary | ICD-10-CM | POA: Diagnosis not present

## 2020-12-02 DIAGNOSIS — M6389 Disorders of muscle in diseases classified elsewhere, multiple sites: Secondary | ICD-10-CM | POA: Diagnosis not present

## 2020-12-02 DIAGNOSIS — R278 Other lack of coordination: Secondary | ICD-10-CM | POA: Diagnosis not present

## 2020-12-02 DIAGNOSIS — I10 Essential (primary) hypertension: Secondary | ICD-10-CM | POA: Diagnosis not present

## 2020-12-03 ENCOUNTER — Encounter: Payer: Self-pay | Admitting: Adult Health

## 2020-12-03 ENCOUNTER — Non-Acute Institutional Stay (SKILLED_NURSING_FACILITY): Payer: Medicare Other | Admitting: Adult Health

## 2020-12-03 DIAGNOSIS — R293 Abnormal posture: Secondary | ICD-10-CM | POA: Diagnosis not present

## 2020-12-03 DIAGNOSIS — M62562 Muscle wasting and atrophy, not elsewhere classified, left lower leg: Secondary | ICD-10-CM | POA: Diagnosis not present

## 2020-12-03 DIAGNOSIS — S065X3S Traumatic subdural hemorrhage with loss of consciousness of 1 hour to 5 hours 59 minutes, sequela: Secondary | ICD-10-CM | POA: Diagnosis not present

## 2020-12-03 DIAGNOSIS — N3946 Mixed incontinence: Secondary | ICD-10-CM | POA: Diagnosis not present

## 2020-12-03 DIAGNOSIS — I482 Chronic atrial fibrillation, unspecified: Secondary | ICD-10-CM | POA: Diagnosis not present

## 2020-12-03 DIAGNOSIS — I6601 Occlusion and stenosis of right middle cerebral artery: Secondary | ICD-10-CM | POA: Diagnosis not present

## 2020-12-03 DIAGNOSIS — G4701 Insomnia due to medical condition: Secondary | ICD-10-CM | POA: Diagnosis not present

## 2020-12-03 DIAGNOSIS — H53452 Other localized visual field defect, left eye: Secondary | ICD-10-CM | POA: Diagnosis not present

## 2020-12-03 DIAGNOSIS — F4321 Adjustment disorder with depressed mood: Secondary | ICD-10-CM | POA: Diagnosis not present

## 2020-12-03 DIAGNOSIS — M6389 Disorders of muscle in diseases classified elsewhere, multiple sites: Secondary | ICD-10-CM | POA: Diagnosis not present

## 2020-12-03 DIAGNOSIS — R41841 Cognitive communication deficit: Secondary | ICD-10-CM | POA: Diagnosis not present

## 2020-12-03 DIAGNOSIS — H538 Other visual disturbances: Secondary | ICD-10-CM | POA: Diagnosis not present

## 2020-12-03 DIAGNOSIS — R2689 Other abnormalities of gait and mobility: Secondary | ICD-10-CM | POA: Diagnosis not present

## 2020-12-03 DIAGNOSIS — R41840 Attention and concentration deficit: Secondary | ICD-10-CM | POA: Diagnosis not present

## 2020-12-03 DIAGNOSIS — Z9181 History of falling: Secondary | ICD-10-CM | POA: Diagnosis not present

## 2020-12-03 DIAGNOSIS — I69391 Dysphagia following cerebral infarction: Secondary | ICD-10-CM | POA: Diagnosis not present

## 2020-12-03 DIAGNOSIS — R278 Other lack of coordination: Secondary | ICD-10-CM | POA: Diagnosis not present

## 2020-12-03 DIAGNOSIS — M542 Cervicalgia: Secondary | ICD-10-CM | POA: Diagnosis not present

## 2020-12-03 DIAGNOSIS — M62561 Muscle wasting and atrophy, not elsewhere classified, right lower leg: Secondary | ICD-10-CM | POA: Diagnosis not present

## 2020-12-03 DIAGNOSIS — R2681 Unsteadiness on feet: Secondary | ICD-10-CM | POA: Diagnosis not present

## 2020-12-03 DIAGNOSIS — I10 Essential (primary) hypertension: Secondary | ICD-10-CM | POA: Diagnosis not present

## 2020-12-03 NOTE — Progress Notes (Signed)
Location:  [facility] Room Number: 115-A Place of Service:  SNF [phone]) Provider:  Royal Hawthorn NP   [name] Lima, MD  Patient Care Team: [name] Lima, MD as PCP - General (Internal Medicine) [name] Crocker, MD as Consulting Physician (Ophthalmology) [name] Lima, MD (Internal Medicine)  Extended Emergency Contact Information Primary Emergency Contact: [name] [name] Address: [place], [place] [address] [name] [name] of [place] Phone: [phone] Relation: Spouse Secondary Emergency Contact: [name] [name], [place] [address] [name] [name] of [place] Phone: [phone] Relation: Son  Code Status:  DNR  Goals of care: Advanced Directive information Advanced Directives 11/26/2020  Does Patient Have a Medical Advance Directive? Yes  Type of Advance Directive Out of facility DNR (pink MOST or yellow form)  Does patient want to make changes to medical advance directive? No - Patient declined  Copy of [place] in Chart? -  Would patient like information on creating a medical advance directive? -  Pre-existing out of facility DNR order (yellow form or pink MOST form) -     Chief Complaint  Patient presents with   Depression    HPI:  Pt is a 80 y.o. male seen today for an acute visit for evaluation of depression  His wife asked that I see him for depression. She feels he is sleeping more since he moved to skilled care and wonders if depression could play a role. He is also eating less. He has not verbalized depression or anxiety but is minimally verbal. No PBA symptoms. For lethargy amantadine was increased with mild benefit. He is on trazodone at bedtime for sleep wake cycle issues and is sleeping well   Diet regular, Nectar thick, Mechanical soft    Per Nurse no changes in patient today. He does not come out for activities as his wife feels it is too stimulatory and can cause  further lethargy and fatigue.    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    Other and unspecified hyperlipidemia    Pre-diabetes    Previous back surgery 1978, jan 2007   Psychosexual dysfunction with inhibited sexual excitement    SOB (shortness of breath)    "with heavy exercise"   Stroke ([place]) 08/2013   "I WAS IN AFIB AND THREW A CLOT, THE EFFECTS WERE TRANSITORY AND DIDNT LAST BUT FOR 30 MINUTES"    Thoracic aortic aneurysm [facility])    Past Surgical History:  Procedure Laterality Date   AORTIC AND MITRAL VALVE REPLACEMENT     09/2004   CARDIOVERSION     3 times from 2004-2006   CARDIOVERSION N/A 09/26/2013   Procedure: CARDIOVERSION;  Surgeon: Larey Dresser, MD;  Location: [facility];  Service: Cardiovascular;  Laterality: N/A;   CARDIOVERSION N/A 06/19/2014  Procedure: CARDIOVERSION;  Surgeon: Jerline Pain, MD;  Location: [facility];  Service: Cardiovascular;  Laterality: N/A;   CARDIOVERSION N/A 04/24/2017   Procedure: CARDIOVERSION;  Surgeon: Larey Dresser, MD;  Location: [facility] ENDOSCOPY;  Service: Cardiovascular;  Laterality: N/A;   CARDIOVERSION N/A 06/08/2017   Procedure: CARDIOVERSION;  Surgeon: Pixie Casino, MD;  Location: [facility] ENDOSCOPY;  Service: Cardiovascular;  Laterality: N/A;   CARDIOVERSION N/A 08/24/2017   Procedure: CARDIOVERSION;  Surgeon: Skeet Latch, MD;  Location[facility] ENDOSCOPY;  Service: Cardiovascular;  Laterality: N/A;   CARDIOVERSION N/A 02/14/2018   Procedure:  CARDIOVERSION;  Surgeon: Larey Dresser, MD;  Location: [facility] ENDOSCOPY;  Service: Cardiovascular;  Laterality: N/A;   COLONOSCOPY     CRANIOTOMY N/A 07/28/2020   Procedure: CRANIOTOMY FOR EVACUATION OF SUBDURAL HEMATOMA;  Surgeon: Eustace Kleiner, MD;  Location: [facility];  Service: Neurosurgery;  Laterality: N/A;   IR ANGIO VERTEBRAL SEL SUBCLAVIAN INNOMINATE UNI R MOD SED  08/07/2020   IR CT HEAD LTD  08/07/2020   IR PERCUTANEOUS ART THROMBECTOMY/INFUSION INTRACRANIAL INC DIAG ANGIO  08/07/2020   laminectomies     10/1975 and in 03/2005   RADIOLOGY WITH ANESTHESIA N/A 08/07/2020   Procedure: IR WITH ANESTHESIA;  Surgeon: Luanne Bras, MD;  Location: [facility];  Service: Radiology;  Laterality: N/A;   TONSILLECTOMY     1950   TOTAL HIP ARTHROPLASTY Right 04/03/2018   Procedure: TOTAL HIP ARTHROPLASTY ANTERIOR APPROACH;  Surgeon: Paralee Cancel, MD;  Location: [facility];  Service: Orthopedics;  Laterality: Right;  70 mins    Allergies  Allergen Reactions   Naproxen Hives   No [facility] Food Allergies Other (See Comments)    Scallops - distress, nausea and vomitting    Outpatient Encounter Medications as of 12/03/2020  Medication Sig   acetaminophen (TYLENOL) 325 MG tablet Take 650 mg by mouth every 4 (four) hours as needed for mild pain.   amantadine (SYMMETREL) 50 MG/5ML solution Take 100 mg by mouth daily. 12:00 pm, see other instructions for morning dosing   apixaban (ELIQUIS) 5 MG TABS tablet Take 1 tablet (5 mg total) by mouth 2 (two) times daily.   atorvastatin (LIPITOR) 80 MG tablet Take 1 tablet (80 mg total) by mouth daily.   clobetasol cream (TEMOVATE) 4.00 % Apply 1 application topically daily as needed (for skin irritation).    dofetilide (TIKOSYN) 500 MCG capsule Take 1 capsule (500 mcg total) by mouth 2 (two) times daily.   famotidine (PEPCID) 20 MG tablet Take 20 mg by mouth at bedtime as needed.   finasteride (PROSCAR) 5 MG tablet Take 5 mg by mouth daily.   hydrALAZINE (APRESOLINE)  10 MG tablet Take 10 mg by mouth every 6 (six) hours as needed (For b/p greater than 150/90).   hydrocortisone cream 1 % Apply topically 2 (two) times daily as needed for itching (rash).   levETIRAcetam (KEPPRA) 500 MG tablet Take 1 tablet (500 mg total) by mouth 2 (two) times daily.   loratadine (CLARITIN) 10 MG tablet Take 10 mg by mouth at bedtime.   losartan (COZAAR) 25 MG tablet Place 1 tablet (25 mg total) into feeding tube daily.   magnesium gluconate (MAGONATE) 500 MG tablet Take 500 mg by mouth daily.   metoprolol tartrate (LOPRESSOR) 25 MG tablet Take 1 tablet (25 mg total) by mouth 2 (two) times daily.   polyethylene glycol (MIRALAX / GLYCOLAX) 17 g packet Take 17 g by mouth daily as needed.   polyvinyl alcohol (LIQUIFILM  TEARS) 1.4 % ophthalmic solution Place 1 drop into both eyes 2 (two) times daily.   potassium chloride (KLOR-CON) 20 MEQ packet Take 40 mEq by mouth 2 (two) times daily.   sennosides-docusate sodium (SENOKOT-S) 8.6-50 MG tablet Take 2 tablets by mouth at bedtime.   tamsulosin (FLOMAX) 0.4 MG CAPS capsule Take 0.4 mg by mouth daily.   [DISCONTINUED] traZODone (DESYREL) 100 MG tablet Take 1 tablet (100 mg total) by mouth at bedtime.   amantadine (SYMMETREL) 50 MG/5ML solution as directed. amt: 200 mg/20 mL; oral  Special Instructions: Per Dr, please administer medication as close to these times as possible Once A Morning; 08:00 AM - 09:00 AM   feeding supplement, GLUCERNA SHAKE, (GLUCERNA SHAKE) LIQD Take 237 mLs by mouth every evening. For weight loss   No facility-administered encounter medications on file as of 12/03/2020.    Review of Systems  Unable to perform ROS: Patient nonverbal   Immunization History  Administered Date(s) Administered   Fluad Quad(high Dose 65+) 12/06/2018   Influenza Whole 12/25/2008, 01/05/2010, 12/30/2011   Influenza, High Dose Seasonal PF 12/14/2016   Influenza,inj,Quad PF,6+ Mos 12/19/2013   Influenza,inj,quad, With Preservative  11/22/2017   Influenza-Unspecified 12/14/2015, 12/24/2019   Moderna SARS-COV2 Booster Vaccination 01/02/2020, 06/26/2020   Moderna Sars-Covid-2 Vaccination 04/08/2019, 05/05/2019   Pneumococcal Conjugate-13 12/19/2013   Pneumococcal Polysaccharide-23 09/24/2007, 05/22/2017   Td 12/25/2008   Tdap 03/22/2018   Zoster Recombinat (Shingrix) 07/16/2019, 10/28/2019   Zoster, Live 09/24/2007   Pertinent  Health Maintenance Due  Topic Date Due   FOOT EXAM  Never done   INFLUENZA VACCINE  10/05/2020   OPHTHALMOLOGY EXAM  04/07/2021 (Originally 10/04/1950)   HEMOGLOBIN A1C  02/07/2021   Fall Risk  11/11/2020 07/11/2019 06/27/2018 05/23/2017 05/22/2017  Falls in the past year? 0 0 0 No No  Number falls in past yr: 0 0 0 - -  Injury with Fall? 0 0 - - -  Risk for fall due to : - Orthopedic patient - - -  Risk for fall due to: Comment - left hip pain; seeing ortho - - -  Follow up - Falls evaluation completed;Education provided - - -   Functional Status Survey:    There were no vitals filed for this visit. There is no height or weight on file to calculate BMI. Physical Exam Constitutional:      Appearance: Normal appearance.  Cardiovascular:     Rate and Rhythm: Normal rate and regular rhythm.  Pulmonary:     Effort: Pulmonary effort is normal.     Breath sounds: Normal breath sounds.  Musculoskeletal:     Right lower leg: No edema.     Left lower leg: No edema.  Skin:    Coloration: Skin is pale.  Neurological:     Mental Status: He is alert.     Comments: Asleep, not verbal difficult to arouse. No obvious focal deficit.     Labs reviewed: Recent Labs    08/09/20 1611 08/09/20 2158 08/12/20 2154 08/13/20 0513 08/13/20 1200 08/14/20 0414 09/11/20 0402 09/15/20 0432 09/17/20 0436 09/21/20 0000 09/29/20 0000 10/13/20 0000 10/16/20 0000 11/06/20 0000  NA 140   < > 152*   < >  --    < > 141 140 140 139   < > 147 147 145  K  --    < > 3.3*   < >  --    < > 4.0 3.9 4.1 4.1   <  > 4.0 4.0  4.2  CL  --    < > 122*   < >  --    < > 109 107 107 105   < > 105 109* 110*  CO2  --    < > 26   < >  --    < > 27 27 26  23*   < > 23* 23* 25*  GLUCOSE  --    < > 174*   < >  --    < > 109* 107* 128*  --   --   --   --   --   BUN  --    < > 33*   < >  --    < > 27* 20 23 18    < > 17 17 21   CREATININE  --    < > 0.73   < >  --    < > 0.85 0.79 0.85 0.7   < > 0.8 0.8 0.8  CALCIUM  --    < > 7.8*   < >  --    < > 8.7* 9.1 9.1 9.2   < > 9.5 9.3 9.3  MG 2.0   < > 2.3   < >  --    < >  --  2.0  --  2.0  --   --  1.9  --   PHOS 2.7  --  1.9*  --  2.2*  --   --   --   --   --   --   --   --   --    < > = values in this interval not displayed.   Recent Labs    07/27/20 1833 08/01/20 0610 08/16/20 0427 11/06/20 0000  AST 34 39 74* 22  ALT 33 52* 100* 28  ALKPHOS 65 46 63 116  BILITOT 0.9 1.1 0.8  --   PROT 7.0 5.9* 5.2*  --   ALBUMIN 3.8 2.8* 2.2*  --    Recent Labs    08/08/20 0625 08/09/20 0451 08/16/20 0427 08/17/20 0340 09/08/20 0344 09/11/20 0402 09/15/20 0432 10/16/20 0000 11/06/20 0000  WBC 17.5* 16.4* 7.9   < > 11.8* 10.8* 11.7* 11.2 6.9  NEUTROABS 13.5* 12.6* 5.7  --   --   --   --   --   --   HGB 12.3* 11.6* 9.0*   < > 11.6* 11.3* 11.8* 11.5* 11.7*  HCT 36.7* 34.6* 28.2*   < > 35.8* 35.0* 36.3* 34* 34*  MCV 94.1 93.5 98.6   < > 95.7 94.9 94.3  --   --   PLT 254 229 177   < > 197 189 188 245 201   < > = values in this interval not displayed.   Lab Results  Component Value Date   TSH 2.35 10/16/2020   Lab Results  Component Value Date   HGBA1C 6.2 (H) 08/08/2020   Lab Results  Component Value Date   CHOL 119 08/08/2020   HDL 42 08/08/2020   LDLCALC 56 08/08/2020   TRIG 105 08/08/2020   CHOLHDL 2.8 08/08/2020    Significant Diagnostic Results in last 30 days:  No results found.  Assessment/Plan  1. Situational depression We discussed trying zoloft for depression due to his lethargy although it is difficult to ascertain the difference between  symptoms of depression and TBI related variation in cognition. He is having a good today but if he continues to have periods of lethargy  his wife may want to try zoloft. Will hold off for now. Zoloft would be a safer choice since he is on tikosyn.  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 McCue NP with neurology, along with Candace pharmacist with [facility]  Labs/tests ordered:  NA

## 2020-12-04 DIAGNOSIS — R2689 Other abnormalities of gait and mobility: Secondary | ICD-10-CM | POA: Diagnosis not present

## 2020-12-04 DIAGNOSIS — S065X3S Traumatic subdural hemorrhage with loss of consciousness of 1 hour to 5 hours 59 minutes, sequela: Secondary | ICD-10-CM | POA: Diagnosis not present

## 2020-12-04 DIAGNOSIS — I69391 Dysphagia following cerebral infarction: Secondary | ICD-10-CM | POA: Diagnosis not present

## 2020-12-04 DIAGNOSIS — R41841 Cognitive communication deficit: Secondary | ICD-10-CM | POA: Diagnosis not present

## 2020-12-04 DIAGNOSIS — M62562 Muscle wasting and atrophy, not elsewhere classified, left lower leg: Secondary | ICD-10-CM | POA: Diagnosis not present

## 2020-12-04 DIAGNOSIS — R2681 Unsteadiness on feet: Secondary | ICD-10-CM | POA: Diagnosis not present

## 2020-12-04 DIAGNOSIS — R278 Other lack of coordination: Secondary | ICD-10-CM | POA: Diagnosis not present

## 2020-12-04 DIAGNOSIS — M62561 Muscle wasting and atrophy, not elsewhere classified, right lower leg: Secondary | ICD-10-CM | POA: Diagnosis not present

## 2020-12-04 DIAGNOSIS — I10 Essential (primary) hypertension: Secondary | ICD-10-CM | POA: Diagnosis not present

## 2020-12-04 DIAGNOSIS — I6601 Occlusion and stenosis of right middle cerebral artery: Secondary | ICD-10-CM | POA: Diagnosis not present

## 2020-12-07 ENCOUNTER — Telehealth: Payer: Self-pay | Admitting: Physical Medicine & Rehabilitation

## 2020-12-07 DIAGNOSIS — R41840 Attention and concentration deficit: Secondary | ICD-10-CM | POA: Diagnosis not present

## 2020-12-07 DIAGNOSIS — Z9181 History of falling: Secondary | ICD-10-CM | POA: Diagnosis not present

## 2020-12-07 DIAGNOSIS — M62561 Muscle wasting and atrophy, not elsewhere classified, right lower leg: Secondary | ICD-10-CM | POA: Diagnosis not present

## 2020-12-07 DIAGNOSIS — H538 Other visual disturbances: Secondary | ICD-10-CM | POA: Diagnosis not present

## 2020-12-07 DIAGNOSIS — H53452 Other localized visual field defect, left eye: Secondary | ICD-10-CM | POA: Diagnosis not present

## 2020-12-07 DIAGNOSIS — S065X3S Traumatic subdural hemorrhage with loss of consciousness of 1 hour to 5 hours 59 minutes, sequela: Secondary | ICD-10-CM | POA: Diagnosis not present

## 2020-12-07 DIAGNOSIS — M6389 Disorders of muscle in diseases classified elsewhere, multiple sites: Secondary | ICD-10-CM | POA: Diagnosis not present

## 2020-12-07 DIAGNOSIS — R41841 Cognitive communication deficit: Secondary | ICD-10-CM | POA: Diagnosis not present

## 2020-12-07 DIAGNOSIS — I6601 Occlusion and stenosis of right middle cerebral artery: Secondary | ICD-10-CM | POA: Diagnosis not present

## 2020-12-07 DIAGNOSIS — M62562 Muscle wasting and atrophy, not elsewhere classified, left lower leg: Secondary | ICD-10-CM | POA: Diagnosis not present

## 2020-12-07 DIAGNOSIS — R278 Other lack of coordination: Secondary | ICD-10-CM | POA: Diagnosis not present

## 2020-12-07 DIAGNOSIS — N3946 Mixed incontinence: Secondary | ICD-10-CM | POA: Diagnosis not present

## 2020-12-07 DIAGNOSIS — I10 Essential (primary) hypertension: Secondary | ICD-10-CM | POA: Diagnosis not present

## 2020-12-07 DIAGNOSIS — I69391 Dysphagia following cerebral infarction: Secondary | ICD-10-CM | POA: Diagnosis not present

## 2020-12-07 DIAGNOSIS — M542 Cervicalgia: Secondary | ICD-10-CM | POA: Diagnosis not present

## 2020-12-07 DIAGNOSIS — R293 Abnormal posture: Secondary | ICD-10-CM | POA: Diagnosis not present

## 2020-12-07 DIAGNOSIS — R2689 Other abnormalities of gait and mobility: Secondary | ICD-10-CM | POA: Diagnosis not present

## 2020-12-07 DIAGNOSIS — R2681 Unsteadiness on feet: Secondary | ICD-10-CM | POA: Diagnosis not present

## 2020-12-07 NOTE — Telephone Encounter (Signed)
Patient's wife would like to speak with Dr. Naaman Plummer about patients cognitive abilities since last visit in September.  Patient isn't talking, sleeping a lot and ST can't patient to engage with them at all  Wife is very concerned, please call her at [phone].

## 2020-12-08 DIAGNOSIS — I69391 Dysphagia following cerebral infarction: Secondary | ICD-10-CM | POA: Diagnosis not present

## 2020-12-08 DIAGNOSIS — R278 Other lack of coordination: Secondary | ICD-10-CM | POA: Diagnosis not present

## 2020-12-08 DIAGNOSIS — R41840 Attention and concentration deficit: Secondary | ICD-10-CM | POA: Diagnosis not present

## 2020-12-08 DIAGNOSIS — M62562 Muscle wasting and atrophy, not elsewhere classified, left lower leg: Secondary | ICD-10-CM | POA: Diagnosis not present

## 2020-12-08 DIAGNOSIS — H538 Other visual disturbances: Secondary | ICD-10-CM | POA: Diagnosis not present

## 2020-12-08 DIAGNOSIS — R41841 Cognitive communication deficit: Secondary | ICD-10-CM | POA: Diagnosis not present

## 2020-12-08 DIAGNOSIS — M62561 Muscle wasting and atrophy, not elsewhere classified, right lower leg: Secondary | ICD-10-CM | POA: Diagnosis not present

## 2020-12-08 DIAGNOSIS — Z9181 History of falling: Secondary | ICD-10-CM | POA: Diagnosis not present

## 2020-12-08 DIAGNOSIS — I10 Essential (primary) hypertension: Secondary | ICD-10-CM | POA: Diagnosis not present

## 2020-12-08 DIAGNOSIS — R2681 Unsteadiness on feet: Secondary | ICD-10-CM | POA: Diagnosis not present

## 2020-12-08 DIAGNOSIS — N3946 Mixed incontinence: Secondary | ICD-10-CM | POA: Diagnosis not present

## 2020-12-08 DIAGNOSIS — R2689 Other abnormalities of gait and mobility: Secondary | ICD-10-CM | POA: Diagnosis not present

## 2020-12-08 DIAGNOSIS — H53452 Other localized visual field defect, left eye: Secondary | ICD-10-CM | POA: Diagnosis not present

## 2020-12-08 DIAGNOSIS — M6389 Disorders of muscle in diseases classified elsewhere, multiple sites: Secondary | ICD-10-CM | POA: Diagnosis not present

## 2020-12-08 DIAGNOSIS — S065X3S Traumatic subdural hemorrhage with loss of consciousness of 1 hour to 5 hours 59 minutes, sequela: Secondary | ICD-10-CM | POA: Diagnosis not present

## 2020-12-08 DIAGNOSIS — R293 Abnormal posture: Secondary | ICD-10-CM | POA: Diagnosis not present

## 2020-12-08 DIAGNOSIS — M542 Cervicalgia: Secondary | ICD-10-CM | POA: Diagnosis not present

## 2020-12-08 DIAGNOSIS — I6601 Occlusion and stenosis of right middle cerebral artery: Secondary | ICD-10-CM | POA: Diagnosis not present

## 2020-12-08 NOTE — Telephone Encounter (Signed)
Patients wife Herbert Pun would like to speak with Dr. Naaman Plummer about possibly having patient see a neuropsychologist or psychiatrist.  Please call Herbert Pun at [phone].

## 2020-12-09 DIAGNOSIS — H53452 Other localized visual field defect, left eye: Secondary | ICD-10-CM | POA: Diagnosis not present

## 2020-12-09 DIAGNOSIS — R2689 Other abnormalities of gait and mobility: Secondary | ICD-10-CM | POA: Diagnosis not present

## 2020-12-09 DIAGNOSIS — R2681 Unsteadiness on feet: Secondary | ICD-10-CM | POA: Diagnosis not present

## 2020-12-09 DIAGNOSIS — R41840 Attention and concentration deficit: Secondary | ICD-10-CM | POA: Diagnosis not present

## 2020-12-09 DIAGNOSIS — Z9181 History of falling: Secondary | ICD-10-CM | POA: Diagnosis not present

## 2020-12-09 DIAGNOSIS — M6389 Disorders of muscle in diseases classified elsewhere, multiple sites: Secondary | ICD-10-CM | POA: Diagnosis not present

## 2020-12-09 DIAGNOSIS — I69391 Dysphagia following cerebral infarction: Secondary | ICD-10-CM | POA: Diagnosis not present

## 2020-12-09 DIAGNOSIS — R278 Other lack of coordination: Secondary | ICD-10-CM | POA: Diagnosis not present

## 2020-12-09 DIAGNOSIS — I6601 Occlusion and stenosis of right middle cerebral artery: Secondary | ICD-10-CM | POA: Diagnosis not present

## 2020-12-09 DIAGNOSIS — N3946 Mixed incontinence: Secondary | ICD-10-CM | POA: Diagnosis not present

## 2020-12-09 DIAGNOSIS — S065X3S Traumatic subdural hemorrhage with loss of consciousness of 1 hour to 5 hours 59 minutes, sequela: Secondary | ICD-10-CM | POA: Diagnosis not present

## 2020-12-09 DIAGNOSIS — M62562 Muscle wasting and atrophy, not elsewhere classified, left lower leg: Secondary | ICD-10-CM | POA: Diagnosis not present

## 2020-12-09 DIAGNOSIS — M62561 Muscle wasting and atrophy, not elsewhere classified, right lower leg: Secondary | ICD-10-CM | POA: Diagnosis not present

## 2020-12-09 DIAGNOSIS — I10 Essential (primary) hypertension: Secondary | ICD-10-CM | POA: Diagnosis not present

## 2020-12-09 DIAGNOSIS — H538 Other visual disturbances: Secondary | ICD-10-CM | POA: Diagnosis not present

## 2020-12-09 DIAGNOSIS — M542 Cervicalgia: Secondary | ICD-10-CM | POA: Diagnosis not present

## 2020-12-09 DIAGNOSIS — R41841 Cognitive communication deficit: Secondary | ICD-10-CM | POA: Diagnosis not present

## 2020-12-09 DIAGNOSIS — R293 Abnormal posture: Secondary | ICD-10-CM | POA: Diagnosis not present

## 2020-12-10 ENCOUNTER — Encounter: Payer: Self-pay | Admitting: Adult Health

## 2020-12-10 ENCOUNTER — Non-Acute Institutional Stay (SKILLED_NURSING_FACILITY): Payer: Medicare Other | Admitting: Adult Health

## 2020-12-10 DIAGNOSIS — M542 Cervicalgia: Secondary | ICD-10-CM | POA: Diagnosis not present

## 2020-12-10 DIAGNOSIS — M6389 Disorders of muscle in diseases classified elsewhere, multiple sites: Secondary | ICD-10-CM | POA: Diagnosis not present

## 2020-12-10 DIAGNOSIS — I6601 Occlusion and stenosis of right middle cerebral artery: Secondary | ICD-10-CM | POA: Diagnosis not present

## 2020-12-10 DIAGNOSIS — H53452 Other localized visual field defect, left eye: Secondary | ICD-10-CM | POA: Diagnosis not present

## 2020-12-10 DIAGNOSIS — R41841 Cognitive communication deficit: Secondary | ICD-10-CM | POA: Diagnosis not present

## 2020-12-10 DIAGNOSIS — F4321 Adjustment disorder with depressed mood: Secondary | ICD-10-CM | POA: Diagnosis not present

## 2020-12-10 DIAGNOSIS — I482 Chronic atrial fibrillation, unspecified: Secondary | ICD-10-CM

## 2020-12-10 DIAGNOSIS — R41840 Attention and concentration deficit: Secondary | ICD-10-CM | POA: Diagnosis not present

## 2020-12-10 DIAGNOSIS — H538 Other visual disturbances: Secondary | ICD-10-CM | POA: Diagnosis not present

## 2020-12-10 DIAGNOSIS — R293 Abnormal posture: Secondary | ICD-10-CM | POA: Diagnosis not present

## 2020-12-10 DIAGNOSIS — Z79899 Other long term (current) drug therapy: Secondary | ICD-10-CM | POA: Diagnosis not present

## 2020-12-10 DIAGNOSIS — R278 Other lack of coordination: Secondary | ICD-10-CM | POA: Diagnosis not present

## 2020-12-10 DIAGNOSIS — R634 Abnormal weight loss: Secondary | ICD-10-CM

## 2020-12-10 DIAGNOSIS — S069X3S Unspecified intracranial injury with loss of consciousness of 1 hour to 5 hours 59 minutes, sequela: Secondary | ICD-10-CM | POA: Diagnosis not present

## 2020-12-10 DIAGNOSIS — Z9181 History of falling: Secondary | ICD-10-CM | POA: Diagnosis not present

## 2020-12-10 DIAGNOSIS — I10 Essential (primary) hypertension: Secondary | ICD-10-CM | POA: Diagnosis not present

## 2020-12-10 DIAGNOSIS — S065X3S Traumatic subdural hemorrhage with loss of consciousness of 1 hour to 5 hours 59 minutes, sequela: Secondary | ICD-10-CM | POA: Diagnosis not present

## 2020-12-10 DIAGNOSIS — I69391 Dysphagia following cerebral infarction: Secondary | ICD-10-CM | POA: Diagnosis not present

## 2020-12-10 DIAGNOSIS — N3946 Mixed incontinence: Secondary | ICD-10-CM | POA: Diagnosis not present

## 2020-12-10 LAB — BASIC METABOLIC PANEL
BUN: 41 — AB (ref 4–21)
CO2: 21 (ref 13–22)
Chloride: 119 — AB (ref 99–108)
Creatinine: 1.2 (ref 0.6–1.3)
Glucose: 156
Potassium: 4.9 (ref 3.4–5.3)

## 2020-12-10 LAB — COMPREHENSIVE METABOLIC PANEL
Albumin: 3.3 — AB (ref 3.5–5.0)
Calcium: 8.9 (ref 8.7–10.7)

## 2020-12-10 LAB — CBC AND DIFFERENTIAL
HCT: 40 — AB (ref 41–53)
Hemoglobin: 12.9 — AB (ref 13.5–17.5)
Platelets: 159 (ref 150–399)
WBC: 11.6

## 2020-12-10 LAB — HEPATIC FUNCTION PANEL
ALT: 77 — AB (ref 10–40)
AST: 55 — AB (ref 14–40)
Alkaline Phosphatase: 109 (ref 25–125)
Bilirubin, Total: 0.4

## 2020-12-10 LAB — CBC: RBC: 4.6 (ref 3.87–5.11)

## 2020-12-10 NOTE — Progress Notes (Addendum)
Location:  [facility] Room Number: 115-A Place of Service:  SNF [phone]) Provider:  Royal Hawthorn, NP   Patient Care Team: [name], MD as PCP - General (Internal Medicine) [name], MD as Consulting Physician (Ophthalmology) [name], MD (Internal Medicine)  Extended Emergency Contact Information Primary Emergency Contact: [name] Address: [place], [place] [address] [name] of [place] Phone: [phone] Relation: Spouse Secondary Emergency Contact: [name], [place] [address] [name] of [place] Phone: [phone] Relation: Son  Code Status:  DNR Goals of care: Advanced Directive information Advanced Directives 12/10/2020  Does Patient Have a Medical Advance Directive? Yes  Type of Paramedic of [place];Out of facility DNR (pink MOST or yellow form)  Does patient want to make changes to medical advance directive? No - Patient declined  Copy of [place] in Chart? Yes - validated most recent copy scanned in chart (See row information)  Would patient like information on creating a medical advance directive? -  Pre-existing out of facility DNR order (yellow form or pink MOST form) -     Chief Complaint  Patient presents with   Acute Visit    Decreased appetite     HPI:  Pt is a 80 y.o. male seen today for an acute visit for decreased appetite.  He is s/p syncope with fall in May of 2022, TBI with SDH with craniotomy, acute right MCA s/p thrombectomy , with weakness dysphagia, and now is in skilled care rehab at [facility].  Since he moved from the rehab area to the skilled are he has had difficulty adjusting and has been more lethargic, less motivated, and having a poor performance with therapy. We had considered an antidepressant (zoloft per discussion with neurology NP) but last week he had a good day so we held off.   He has required extensive assistance with ambulation, toileting, etc. Sleeping more and eating less.  He has been losing weight, 8-10 lbs since July. Needs to be encouraged to drink water. Nutritionist ordered glucerna.   Pulse today on apple watch was 118. Later, I got 91 on auscultation, regular.  He has a hx of afib but has been in sinus on tikosyn and lopressor. EKG 12 lead showed sinus rhythm 12/10/2020   He has bladder scans done and if having retention the nurse catheterizes him. He is back on his BPH meds and he is being cathed less. Going to see urology next week.   I met with his wife Herbert Pun in person and their son Jeral Fruit (by phone). They are concerned about his weight loss and decreased performance with therapy. Their goal is for him to return home if he is able to get to the BR. At this time he is a skilled level of care and needs too much assistance for the home environment.   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    Other and unspecified hyperlipidemia    Pre-diabetes    Previous back surgery 1978, jan 2007   Psychosexual dysfunction with inhibited sexual excitement    SOB (shortness of breath)    "with heavy exercise"   Stroke ([place]) 08/2013   "I WAS IN AFIB AND THREW A CLOT, THE EFFECTS WERE TRANSITORY AND DIDNT LAST BUT FOR 30 MINUTES"    Thoracic aortic aneurysm    Past Surgical History:  Procedure Laterality Date   AORTIC AND MITRAL VALVE REPLACEMENT     09/2004   CARDIOVERSION     3  times from 2004-2006   CARDIOVERSION N/A 09/26/2013   Procedure: CARDIOVERSION;  Surgeon: Larey Dresser, MD;  Location: [facility];  Service: Cardiovascular;  Laterality: N/A;   CARDIOVERSION N/A 06/19/2014   Procedure: CARDIOVERSION;  Surgeon: Jerline Pain, MD;  Location: [facility];  Service: Cardiovascular;  Laterality: N/A;   CARDIOVERSION N/A 04/24/2017   Procedure: CARDIOVERSION;  Surgeon: Larey Dresser, MD;  Location: [facility];  Service: Cardiovascular;  Laterality: N/A;   CARDIOVERSION N/A 06/08/2017   Procedure: CARDIOVERSION;  Surgeon: Pixie Casino, MD;  Location[name] ENDOSCOPY;  Service: Cardiovascular;  Laterality: N/A;   CARDIOVERSION N/A 08/24/2017   Procedure: CARDIOVERSION;  Surgeon: Skeet Latch, MD;  Location: [facility] ENDOSCOPY;  Service: Cardiovascular;  Laterality: N/A;   CARDIOVERSION N/A 02/14/2018   Procedure: CARDIOVERSION;  Surgeon: Larey Dresser, MD;  Location: [facility] END[facility]COPY;  Service: Cardiovascular;  Laterality: N/A;   COLONOSCOPY     CRANIOTOMY N/A 07/28/2020   Procedure: CRANIOTOMY FOR EVACUATION OF SUBDURAL HEMATOMA;  Surgeon: Eustace Dalpe, MD;  Location: [facility];  Service: Neurosurgery;  Laterality: N/A;   IR ANGIO VERTEBRAL SEL SUBCLAVIAN INNOMINATE UNI R MOD SED  08/07/2020   IR CT HEAD LTD  08/07/2020   IR PERCUTANEOUS ART THROMBECTOMY/INFUSION INTRACRANIAL INC DIAG ANGIO  08/07/2020   laminectomies     10/1975 and in 03/2005   RADIOLOGY WITH ANESTHESIA N/A 08/07/2020   Procedure: IR WITH ANESTHESIA;  Surgeon: Luanne Bras, MD;  Location: [facility];  Service: Radiology;  Laterality: N/A;   TONSILLECTOMY     1950   TOTAL HIP ARTHROPLASTY Right 04/03/2018   Procedure: TOTAL HIP ARTHROPLASTY ANTERIOR APPROACH;  Surgeon: Paralee Cancel, MD;  Location: [facility];  Service: Orthopedics;  Laterality: Right;  70 mins    Allergies  Allergen Reactions   Naproxen Hives   No [facility] Food Allergies Other (See Comments)    Scallops - distress, nausea and  vomitting    Outpatient Encounter Medications as of 12/10/2020  Medication Sig   acetaminophen (TYLENOL) 325 MG tablet Take 650 mg by mouth every 4 (four) hours as needed for mild pain.   amantadine (SYMMETREL) 50 MG/5ML solution as directed. amt: 200 mg/20 mL; oral  Special Instructions: Per Dr, please administer medication as close to these times as possible Once A Morning; 08:00 AM - 09:00 AM   amantadine (SYMMETREL) 50 MG/5ML solution Take 100 mg by mouth daily. 12:00 pm, see other instructions for morning dosing   apixaban (ELIQUIS) 5 MG TABS tablet Take 1 tablet (5 mg total) by mouth 2 (two) times daily.   atorvastatin (LIPITOR) 80 MG tablet Take 1 tablet (80 mg total) by mouth daily.   clobetasol cream (TEMOVATE) 5.46 % Apply 1 application topically daily as needed (  for skin irritation).    dofetilide (TIKOSYN) 500 MCG capsule Take 1 capsule (500 mcg total) by mouth 2 (two) times daily.   famotidine (PEPCID) 20 MG tablet Take 20 mg by mouth at bedtime as needed.   feeding supplement, GLUCERNA SHAKE, (GLUCERNA SHAKE) LIQD Take 237 mLs by mouth 2 (two) times daily between meals. For weight loss   finasteride (PROSCAR) 5 MG tablet Take 5 mg by mouth daily.   hydrALAZINE (APRESOLINE) 10 MG tablet Take 10 mg by mouth every 6 (six) hours as needed (For b/p greater than 150/90).   hydrocortisone cream 1 % Apply topically 2 (two) times daily as needed for itching (rash).   levETIRAcetam (KEPPRA) 500 MG tablet Take 1 tablet (500 mg total) by mouth 2 (two) times daily.   loratadine (CLARITIN) 10 MG tablet Take 10 mg by mouth at bedtime.   losartan (COZAAR) 25 MG tablet Place 1 tablet (25 mg total) into feeding tube daily.   magnesium gluconate (MAGONATE) 500 MG tablet Take 500 mg by mouth daily.   metoprolol tartrate (LOPRESSOR) 25 MG tablet Take 1 tablet (25 mg total) by mouth 2 (two) times daily.   polyethylene glycol (MIRALAX / GLYCOLAX) 17 g packet Take 17 g by mouth daily as needed.    polyvinyl alcohol (LIQUIFILM TEARS) 1.4 % ophthalmic solution Place 1 drop into both eyes 2 (two) times daily.   potassium chloride (KLOR-CON) 20 MEQ packet Take 40 mEq by mouth 2 (two) times daily.   sennosides-docusate sodium (SENOKOT-S) 8.6-50 MG tablet Take 2 tablets by mouth at bedtime.   tamsulosin (FLOMAX) 0.4 MG CAPS capsule Take 0.4 mg by mouth daily.   traZODone (DESYREL) 50 MG tablet Take 50 mg by mouth at bedtime.   No facility-administered encounter medications on file as of 12/10/2020.    Review of Systems  Unable to perform ROS: Mental status change   Immunization History  Administered Date(s) Administered   Fluad Quad(high Dose 65+) 12/06/2018   Influenza Whole 12/25/2008, 01/05/2010, 12/30/2011   Influenza, High Dose Seasonal PF 12/14/2016   Influenza,inj,Quad PF,6+ Mos 12/19/2013   Influenza,inj,quad, With Preservative 11/22/2017   Influenza-Unspecified 12/14/2015, 12/24/2019   Moderna SARS-COV2 Booster Vaccination 01/02/2020, 06/26/2020   Moderna Sars-Covid-2 Vaccination 04/08/2019, 05/05/2019   Pneumococcal Conjugate-13 12/19/2013   Pneumococcal Polysaccharide-23 09/24/2007, 05/22/2017   Td 12/25/2008   Tdap 03/22/2018   Zoster Recombinat (Shingrix) 07/16/2019, 10/28/2019   Zoster, Live 09/24/2007   Pertinent  Health Maintenance Due  Topic Date Due   FOOT EXAM  Never done   INFLUENZA VACCINE  10/05/2020   OPHTHALMOLOGY EXAM  04/07/2021 (Originally 10/04/1950)   HEMOGLOBIN A1C  02/07/2021   Fall Risk  11/11/2020 07/11/2019 06/27/2018 05/23/2017 05/22/2017  Falls in the past year? 0 0 0 No No  Number falls in past yr: 0 0 0 - -  Injury with Fall? 0 0 - - -  Risk for fall due to : - Orthopedic patient - - -  Risk for fall due to: Comment - left hip pain; seeing ortho - - -  Follow up - Falls evaluation completed;Education provided - - -   Functional Status Survey:    Vitals:   12/10/20 0957  BP: 108/78  Pulse: 87  Resp: 18  Temp: (!) 97.5 F (36.4 C)   SpO2: 96%  Weight: 166 lb (75.3 kg)  Height: 5' 7" (1.702 m)   Body mass index is 26 kg/m. Physical Exam Vitals and nursing note reviewed.  Constitutional:  General: He is not in acute distress.    Appearance: He is not diaphoretic.     Comments: Sleep, does arouse. Answer yes and no. Does not elaborate  HENT:     Head: Normocephalic and atraumatic.     Mouth/Throat:     Mouth: Mucous membranes are dry.  Eyes:     General:        Right eye: No discharge.        Left eye: No discharge.     Conjunctiva/sclera: Conjunctivae normal.     Pupils: Pupils are equal, round, and reactive to light.  Neck:     Thyroid: No thyromegaly.     Vascular: No JVD.     Trachea: No tracheal deviation.  Cardiovascular:     Rate and Rhythm: Normal rate and regular rhythm.     Heart sounds: No murmur (very slight maybe 2/6) heard. Pulmonary:     Effort: Pulmonary effort is normal. No respiratory distress.     Breath sounds: Normal breath sounds. No wheezing.  Abdominal:     General: Bowel sounds are normal. There is no distension.     Palpations: Abdomen is soft.     Tenderness: There is no abdominal tenderness.  Musculoskeletal:     Cervical back: Normal range of motion and neck supple.     Right lower leg: No edema.     Left lower leg: No edema.  Lymphadenopathy:     Cervical: No cervical adenopathy.  Skin:    General: Skin is warm and dry.     Coloration: Skin is pale.  Neurological:     Comments: Slight left droop. Weakness to BUE and BLE. No able to f/c for full exam.   Psychiatric:     Comments: Flat, lethargic    Labs reviewed: Recent Labs    08/09/20 1611 08/09/20 2158 08/12/20 2154 08/13/20 0513 08/13/20 1200 08/14/20 0414 09/11/20 0402 09/15/20 0432 09/17/20 0436 09/21/20 0000 09/29/20 0000 10/13/20 0000 10/16/20 0000 11/06/20 0000  NA 140   < > 152*   < >  --    < > 141 140 140 139   < > 147 147 145  K  --    < > 3.3*   < >  --    < > 4.0 3.9 4.1 4.1   < >  4.0 4.0 4.2  CL  --    < > 122*   < >  --    < > 109 107 107 105   < > 105 109* 110*  CO2  --    < > 26   < >  --    < > _0 23*   < > 23* 23* 25*  GLUCOSE  --    < > 174*   < >  --    < > 109* 107* 128*  --   --   --   --   --   BUN  --    < > 33*   < >  --    < > 27* _1 < > _2 CREATININE  --    < > 0.73   < >  --    < > 0.85 0.79 0.85 0.7   < > 0.8 0.8 0.8  CALCIUM  --    < > 7.8*   < >  --    < > 8.7* 9.1 9.1 9.2   < >  9.5 9.3 9.3  MG 2.0   < > 2.3   < >  --    < >  --  2.0  --  2.0  --   --  1.9  --   PHOS 2.7  --  1.9*  --  2.2*  --   --   --   --   --   --   --   --   --    < > = values in this interval not displayed.   Recent Labs    07/27/20 1833 08/01/20 0610 08/16/20 0427 11/06/20 0000  AST 34 39 74* 22  ALT 33 52* 100* 28  ALKPHOS 65 46 63 116  BILITOT 0.9 1.1 0.8  --   PROT 7.0 5.9* 5.2*  --   ALBUMIN 3.8 2.8* 2.2*  --    Recent Labs    08/08/20 0625 08/09/20 0451 08/16/20 0427 08/17/20 0340 09/08/20 0344 09/11/20 0402 09/15/20 0432 10/16/20 0000 11/06/20 0000  WBC 17.5* 16.4* 7.9   < > 11.8* 10.8* 11.7* 11.2 6.9  NEUTROABS 13.5* 12.6* 5.7  --   --   --   --   --   --   HGB 12.3* 11.6* 9.0*   < > 11.6* 11.3* 11.8* 11.5* 11.7*  HCT 36.7* 34.6* 28.2*   < > 35.8* 35.0* 36.3* 34* 34*  MCV 94.1 93.5 98.6   < > 95.7 94.9 94.3  --   --   PLT 254 229 177   < > 197 189 188 245 201   < > = values in this interval not displayed.   Lab Results  Component Value Date   TSH 2.35 10/16/2020   Lab Results  Component Value Date   HGBA1C 6.2 (H) 08/08/2020   Lab Results  Component Value Date   CHOL 119 08/08/2020   HDL 42 08/08/2020   LDLCALC 56 08/08/2020   TRIG 105 08/08/2020   CHOLHDL 2.8 08/08/2020    Significant Diagnostic Results in last 30 days:  No results found.  Assessment/Plan  1. Situational depression There could be a component of depression here but it is difficult to assess with his TBI and lethargy We discussed Remeron  due to weight loss but with his lethargy would prefer to try zoloft and give 4 weeks to see benefit. This was discussed with neurology   2. 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  Will try to treat for depression  If not improving would defer to neurology. Had CT 11/02/20 which showed prior right MCA infarct, old left parietal infarct, and reduced size subdural hematoma.   3. Weight loss Weights weekly Glucerna bid Treating for depression Will check labs, appears dry on exam if necessary will hydrate with IVF.   4. Afib He was found to be in sinus rhythm on 12 lead with a controlled rate. Possibly having some slight elevations at times due to dehydration. Will check labs.  Continues on Eliquis for CVA risk reduction.   Family/ staff Communication: discussed with Herbert Pun his wife and Jeral Fruit his son  Labs/tests ordered:  CBC BMP MG Phos

## 2020-12-11 DIAGNOSIS — M6389 Disorders of muscle in diseases classified elsewhere, multiple sites: Secondary | ICD-10-CM | POA: Diagnosis not present

## 2020-12-11 DIAGNOSIS — M542 Cervicalgia: Secondary | ICD-10-CM | POA: Diagnosis not present

## 2020-12-11 DIAGNOSIS — R41841 Cognitive communication deficit: Secondary | ICD-10-CM | POA: Diagnosis not present

## 2020-12-11 DIAGNOSIS — I10 Essential (primary) hypertension: Secondary | ICD-10-CM | POA: Diagnosis not present

## 2020-12-11 DIAGNOSIS — R278 Other lack of coordination: Secondary | ICD-10-CM | POA: Diagnosis not present

## 2020-12-11 DIAGNOSIS — H538 Other visual disturbances: Secondary | ICD-10-CM | POA: Diagnosis not present

## 2020-12-11 DIAGNOSIS — R41840 Attention and concentration deficit: Secondary | ICD-10-CM | POA: Diagnosis not present

## 2020-12-11 DIAGNOSIS — I6601 Occlusion and stenosis of right middle cerebral artery: Secondary | ICD-10-CM | POA: Diagnosis not present

## 2020-12-11 DIAGNOSIS — Z23 Encounter for immunization: Secondary | ICD-10-CM | POA: Diagnosis not present

## 2020-12-11 DIAGNOSIS — N3946 Mixed incontinence: Secondary | ICD-10-CM | POA: Diagnosis not present

## 2020-12-11 DIAGNOSIS — S065X3S Traumatic subdural hemorrhage with loss of consciousness of 1 hour to 5 hours 59 minutes, sequela: Secondary | ICD-10-CM | POA: Diagnosis not present

## 2020-12-11 DIAGNOSIS — R293 Abnormal posture: Secondary | ICD-10-CM | POA: Diagnosis not present

## 2020-12-11 DIAGNOSIS — Z9181 History of falling: Secondary | ICD-10-CM | POA: Diagnosis not present

## 2020-12-11 DIAGNOSIS — H53452 Other localized visual field defect, left eye: Secondary | ICD-10-CM | POA: Diagnosis not present

## 2020-12-11 DIAGNOSIS — Z79899 Other long term (current) drug therapy: Secondary | ICD-10-CM | POA: Diagnosis not present

## 2020-12-11 DIAGNOSIS — I69391 Dysphagia following cerebral infarction: Secondary | ICD-10-CM | POA: Diagnosis not present

## 2020-12-11 LAB — HEPATIC FUNCTION PANEL
ALT: 62 — AB (ref 10–40)
AST: 39 (ref 14–40)
Alkaline Phosphatase: 93 (ref 25–125)
Bilirubin, Total: 0.2

## 2020-12-11 LAB — COMPREHENSIVE METABOLIC PANEL
Albumin: 2.8 — AB (ref 3.5–5.0)
Calcium: 8.7 (ref 8.7–10.7)
Globulin: 3.5

## 2020-12-11 LAB — BASIC METABOLIC PANEL
BUN: 37 — AB (ref 4–21)
CO2: 21 (ref 13–22)
Chloride: 123 — AB (ref 99–108)
Creatinine: 1.1 (ref 0.6–1.3)
Glucose: 135
Potassium: 4.2 (ref 3.4–5.3)
Sodium: 154 — AB (ref 137–147)

## 2020-12-12 DIAGNOSIS — Z79899 Other long term (current) drug therapy: Secondary | ICD-10-CM | POA: Diagnosis not present

## 2020-12-12 LAB — BASIC METABOLIC PANEL
BUN: 32 — AB (ref 4–21)
CO2: 21 (ref 13–22)
Chloride: 118 — AB (ref 99–108)
Creatinine: 1.1 (ref 0.6–1.3)
Glucose: 172
Potassium: 4.2 (ref 3.4–5.3)
Sodium: 148 — AB (ref 137–147)

## 2020-12-12 LAB — HEPATIC FUNCTION PANEL
ALT: 78 — AB (ref 10–40)
AST: 47 — AB (ref 14–40)
Alkaline Phosphatase: 82 (ref 25–125)
Bilirubin, Total: 0.5

## 2020-12-12 LAB — COMPREHENSIVE METABOLIC PANEL
Albumin: 2.9 — AB (ref 3.5–5.0)
Calcium: 8.5 — AB (ref 8.7–10.7)

## 2020-12-13 DIAGNOSIS — I10 Essential (primary) hypertension: Secondary | ICD-10-CM | POA: Diagnosis not present

## 2020-12-13 LAB — COMPREHENSIVE METABOLIC PANEL: Calcium: 8.3 — AB (ref 8.7–10.7)

## 2020-12-13 LAB — BASIC METABOLIC PANEL
BUN: 29 — AB (ref 4–21)
CO2: 22 (ref 13–22)
Chloride: 120 — AB (ref 99–108)
Creatinine: 1 (ref 0.6–1.3)
Glucose: 136
Potassium: 3.8 (ref 3.4–5.3)
Sodium: 150 — AB (ref 137–147)

## 2020-12-14 ENCOUNTER — Emergency Department (HOSPITAL_COMMUNITY): Payer: Medicare Other

## 2020-12-14 ENCOUNTER — Inpatient Hospital Stay (HOSPITAL_COMMUNITY)
Admission: EM | Admit: 2020-12-14 | Discharge: 2021-01-05 | DRG: 314 | Disposition: A | Discharge status: Skilled Nursing Facility | Payer: Medicare Other | Source: Skilled Nursing Facility | Attending: Internal Medicine | Admitting: Internal Medicine

## 2020-12-14 ENCOUNTER — Other Ambulatory Visit: Payer: Self-pay

## 2020-12-14 ENCOUNTER — Non-Acute Institutional Stay (SKILLED_NURSING_FACILITY): Payer: Medicare Other | Admitting: Internal Medicine

## 2020-12-14 ENCOUNTER — Encounter (HOSPITAL_COMMUNITY): Payer: Self-pay

## 2020-12-14 ENCOUNTER — Encounter: Payer: Self-pay | Admitting: Internal Medicine

## 2020-12-14 DIAGNOSIS — E43 Unspecified severe protein-calorie malnutrition: Secondary | ICD-10-CM | POA: Insufficient documentation

## 2020-12-14 DIAGNOSIS — R7881 Bacteremia: Secondary | ICD-10-CM | POA: Diagnosis present

## 2020-12-14 DIAGNOSIS — Z96641 Presence of right artificial hip joint: Secondary | ICD-10-CM | POA: Diagnosis present

## 2020-12-14 DIAGNOSIS — Z953 Presence of xenogenic heart valve: Secondary | ICD-10-CM

## 2020-12-14 DIAGNOSIS — R110 Nausea: Secondary | ICD-10-CM

## 2020-12-14 DIAGNOSIS — Z8673 Personal history of transient ischemic attack (TIA), and cerebral infarction without residual deficits: Secondary | ICD-10-CM

## 2020-12-14 DIAGNOSIS — I482 Chronic atrial fibrillation, unspecified: Secondary | ICD-10-CM

## 2020-12-14 DIAGNOSIS — Z66 Do not resuscitate: Secondary | ICD-10-CM | POA: Diagnosis present

## 2020-12-14 DIAGNOSIS — R634 Abnormal weight loss: Secondary | ICD-10-CM | POA: Diagnosis not present

## 2020-12-14 DIAGNOSIS — R404 Transient alteration of awareness: Secondary | ICD-10-CM | POA: Diagnosis not present

## 2020-12-14 DIAGNOSIS — I76 Septic arterial embolism: Secondary | ICD-10-CM | POA: Diagnosis present

## 2020-12-14 DIAGNOSIS — I071 Rheumatic tricuspid insufficiency: Secondary | ICD-10-CM | POA: Diagnosis present

## 2020-12-14 DIAGNOSIS — E870 Hyperosmolality and hypernatremia: Secondary | ICD-10-CM | POA: Diagnosis not present

## 2020-12-14 DIAGNOSIS — R112 Nausea with vomiting, unspecified: Secondary | ICD-10-CM

## 2020-12-14 DIAGNOSIS — G4700 Insomnia, unspecified: Secondary | ICD-10-CM | POA: Diagnosis present

## 2020-12-14 DIAGNOSIS — Z833 Family history of diabetes mellitus: Secondary | ICD-10-CM

## 2020-12-14 DIAGNOSIS — M25531 Pain in right wrist: Secondary | ICD-10-CM | POA: Diagnosis present

## 2020-12-14 DIAGNOSIS — R7401 Elevation of levels of liver transaminase levels: Secondary | ICD-10-CM | POA: Diagnosis present

## 2020-12-14 DIAGNOSIS — A4181 Sepsis due to Enterococcus: Secondary | ICD-10-CM | POA: Diagnosis not present

## 2020-12-14 DIAGNOSIS — L89022 Pressure ulcer of left elbow, stage 2: Secondary | ICD-10-CM | POA: Diagnosis present

## 2020-12-14 DIAGNOSIS — K567 Ileus, unspecified: Secondary | ICD-10-CM | POA: Diagnosis not present

## 2020-12-14 DIAGNOSIS — I371 Nonrheumatic pulmonary valve insufficiency: Secondary | ICD-10-CM | POA: Diagnosis present

## 2020-12-14 DIAGNOSIS — N390 Urinary tract infection, site not specified: Secondary | ICD-10-CM | POA: Diagnosis present

## 2020-12-14 DIAGNOSIS — I330 Acute and subacute infective endocarditis: Secondary | ICD-10-CM

## 2020-12-14 DIAGNOSIS — E878 Other disorders of electrolyte and fluid balance, not elsewhere classified: Secondary | ICD-10-CM | POA: Diagnosis present

## 2020-12-14 DIAGNOSIS — I63442 Cerebral infarction due to embolism of left cerebellar artery: Secondary | ICD-10-CM | POA: Diagnosis present

## 2020-12-14 DIAGNOSIS — L8990 Pressure ulcer of unspecified site, unspecified stage: Secondary | ICD-10-CM | POA: Insufficient documentation

## 2020-12-14 DIAGNOSIS — Y712 Prosthetic and other implants, materials and accessory cardiovascular devices associated with adverse incidents: Secondary | ICD-10-CM | POA: Diagnosis present

## 2020-12-14 DIAGNOSIS — I5032 Chronic diastolic (congestive) heart failure: Secondary | ICD-10-CM | POA: Diagnosis present

## 2020-12-14 DIAGNOSIS — D6489 Other specified anemias: Secondary | ICD-10-CM | POA: Diagnosis not present

## 2020-12-14 DIAGNOSIS — Z888 Allergy status to other drugs, medicaments and biological substances status: Secondary | ICD-10-CM

## 2020-12-14 DIAGNOSIS — Z800 Family history of malignant neoplasm of digestive organs: Secondary | ICD-10-CM

## 2020-12-14 DIAGNOSIS — I69391 Dysphagia following cerebral infarction: Secondary | ICD-10-CM

## 2020-12-14 DIAGNOSIS — Z9889 Other specified postprocedural states: Secondary | ICD-10-CM

## 2020-12-14 DIAGNOSIS — R400 Somnolence: Secondary | ICD-10-CM | POA: Insufficient documentation

## 2020-12-14 DIAGNOSIS — Z7901 Long term (current) use of anticoagulants: Secondary | ICD-10-CM

## 2020-12-14 DIAGNOSIS — R7989 Other specified abnormal findings of blood chemistry: Secondary | ICD-10-CM | POA: Diagnosis not present

## 2020-12-14 DIAGNOSIS — I7121 Aneurysm of the ascending aorta, without rupture: Secondary | ICD-10-CM | POA: Diagnosis present

## 2020-12-14 DIAGNOSIS — R509 Fever, unspecified: Secondary | ICD-10-CM | POA: Diagnosis not present

## 2020-12-14 DIAGNOSIS — R000 Tachycardia, unspecified: Secondary | ICD-10-CM | POA: Diagnosis not present

## 2020-12-14 DIAGNOSIS — I440 Atrioventricular block, first degree: Secondary | ICD-10-CM | POA: Diagnosis present

## 2020-12-14 DIAGNOSIS — I110 Hypertensive heart disease with heart failure: Secondary | ICD-10-CM | POA: Diagnosis present

## 2020-12-14 DIAGNOSIS — R1312 Dysphagia, oropharyngeal phase: Secondary | ICD-10-CM | POA: Diagnosis present

## 2020-12-14 DIAGNOSIS — G40909 Epilepsy, unspecified, not intractable, without status epilepticus: Secondary | ICD-10-CM | POA: Diagnosis present

## 2020-12-14 DIAGNOSIS — R54 Age-related physical debility: Secondary | ICD-10-CM | POA: Diagnosis present

## 2020-12-14 DIAGNOSIS — I38 Endocarditis, valve unspecified: Secondary | ICD-10-CM

## 2020-12-14 DIAGNOSIS — T826XXA Infection and inflammatory reaction due to cardiac valve prosthesis, initial encounter: Principal | ICD-10-CM | POA: Diagnosis present

## 2020-12-14 DIAGNOSIS — T826XXD Infection and inflammatory reaction due to cardiac valve prosthesis, subsequent encounter: Secondary | ICD-10-CM

## 2020-12-14 DIAGNOSIS — I6601 Occlusion and stenosis of right middle cerebral artery: Secondary | ICD-10-CM | POA: Diagnosis not present

## 2020-12-14 DIAGNOSIS — Z20822 Contact with and (suspected) exposure to covid-19: Secondary | ICD-10-CM | POA: Diagnosis present

## 2020-12-14 DIAGNOSIS — Z9181 History of falling: Secondary | ICD-10-CM

## 2020-12-14 DIAGNOSIS — Z4659 Encounter for fitting and adjustment of other gastrointestinal appliance and device: Secondary | ICD-10-CM

## 2020-12-14 DIAGNOSIS — E860 Dehydration: Secondary | ICD-10-CM | POA: Diagnosis present

## 2020-12-14 DIAGNOSIS — Z8249 Family history of ischemic heart disease and other diseases of the circulatory system: Secondary | ICD-10-CM

## 2020-12-14 DIAGNOSIS — D638 Anemia in other chronic diseases classified elsewhere: Secondary | ICD-10-CM | POA: Diagnosis present

## 2020-12-14 DIAGNOSIS — R41841 Cognitive communication deficit: Secondary | ICD-10-CM | POA: Diagnosis not present

## 2020-12-14 DIAGNOSIS — R4182 Altered mental status, unspecified: Secondary | ICD-10-CM | POA: Diagnosis not present

## 2020-12-14 DIAGNOSIS — Z87820 Personal history of traumatic brain injury: Secondary | ICD-10-CM

## 2020-12-14 DIAGNOSIS — R0902 Hypoxemia: Secondary | ICD-10-CM | POA: Diagnosis not present

## 2020-12-14 DIAGNOSIS — B9689 Other specified bacterial agents as the cause of diseases classified elsewhere: Secondary | ICD-10-CM | POA: Diagnosis not present

## 2020-12-14 DIAGNOSIS — Z79899 Other long term (current) drug therapy: Secondary | ICD-10-CM | POA: Diagnosis not present

## 2020-12-14 DIAGNOSIS — R52 Pain, unspecified: Secondary | ICD-10-CM

## 2020-12-14 DIAGNOSIS — E785 Hyperlipidemia, unspecified: Secondary | ICD-10-CM | POA: Diagnosis present

## 2020-12-14 DIAGNOSIS — I69354 Hemiplegia and hemiparesis following cerebral infarction affecting left non-dominant side: Secondary | ICD-10-CM

## 2020-12-14 DIAGNOSIS — Z6829 Body mass index (BMI) 29.0-29.9, adult: Secondary | ICD-10-CM

## 2020-12-14 DIAGNOSIS — I69320 Aphasia following cerebral infarction: Secondary | ICD-10-CM

## 2020-12-14 DIAGNOSIS — S065X3S Traumatic subdural hemorrhage with loss of consciousness of 1 hour to 5 hours 59 minutes, sequela: Secondary | ICD-10-CM | POA: Diagnosis not present

## 2020-12-14 DIAGNOSIS — G9341 Metabolic encephalopathy: Secondary | ICD-10-CM | POA: Diagnosis not present

## 2020-12-14 DIAGNOSIS — R627 Adult failure to thrive: Secondary | ICD-10-CM | POA: Diagnosis present

## 2020-12-14 DIAGNOSIS — Z7401 Bed confinement status: Secondary | ICD-10-CM

## 2020-12-14 DIAGNOSIS — R0602 Shortness of breath: Secondary | ICD-10-CM

## 2020-12-14 DIAGNOSIS — Z87440 Personal history of urinary (tract) infections: Secondary | ICD-10-CM

## 2020-12-14 DIAGNOSIS — E871 Hypo-osmolality and hyponatremia: Secondary | ICD-10-CM | POA: Diagnosis present

## 2020-12-14 DIAGNOSIS — I10 Essential (primary) hypertension: Secondary | ICD-10-CM | POA: Diagnosis not present

## 2020-12-14 LAB — CBC WITH DIFFERENTIAL/PLATELET
Abs Immature Granulocytes: 0.06 10*3/uL (ref 0.00–0.07)
Basophils Absolute: 0 10*3/uL (ref 0.0–0.1)
Basophils Relative: 0 %
Eosinophils Absolute: 0 10*3/uL (ref 0.0–0.5)
Eosinophils Relative: 0 %
HCT: 36.8 % — ABNORMAL LOW (ref 39.0–52.0)
Hemoglobin: 11.7 g/dL — ABNORMAL LOW (ref 13.0–17.0)
Immature Granulocytes: 1 %
Lymphocytes Relative: 15 %
Lymphs Abs: 1.7 10*3/uL (ref 0.7–4.0)
MCH: 28.6 pg (ref 26.0–34.0)
MCHC: 31.8 g/dL (ref 30.0–36.0)
MCV: 90 fL (ref 80.0–100.0)
Monocytes Absolute: 0.8 10*3/uL (ref 0.1–1.0)
Monocytes Relative: 7 %
Neutro Abs: 9.3 10*3/uL — ABNORMAL HIGH (ref 1.7–7.7)
Neutrophils Relative %: 77 %
Platelets: 119 10*3/uL — ABNORMAL LOW (ref 150–400)
RBC: 4.09 MIL/uL — ABNORMAL LOW (ref 4.22–5.81)
RDW: 17 % — ABNORMAL HIGH (ref 11.5–15.5)
WBC: 12 10*3/uL — ABNORMAL HIGH (ref 4.0–10.5)
nRBC: 0 % (ref 0.0–0.2)

## 2020-12-14 LAB — COMPREHENSIVE METABOLIC PANEL
ALT: 92 U/L — ABNORMAL HIGH (ref 0–44)
AST: 63 U/L — ABNORMAL HIGH (ref 15–41)
Albumin: 2.3 g/dL — ABNORMAL LOW (ref 3.5–5.0)
Alkaline Phosphatase: 68 U/L (ref 38–126)
Anion gap: 8 (ref 5–15)
BUN: 32 mg/dL — ABNORMAL HIGH (ref 8–23)
CO2: 22 mmol/L (ref 22–32)
Calcium: 8.6 mg/dL — ABNORMAL LOW (ref 8.9–10.3)
Calcium: 85 — AB (ref 8.7–10.7)
Chloride: 119 mmol/L — ABNORMAL HIGH (ref 98–111)
Creatinine, Ser: 1.18 mg/dL (ref 0.61–1.24)
GFR, Estimated: >60 mL/min (ref >60)
Glucose, Bld: 204 mg/dL — ABNORMAL HIGH (ref 70–99)
Potassium: 4.1 mmol/L (ref 3.5–5.1)
Sodium: 149 mmol/L — ABNORMAL HIGH (ref 135–145)
Total Bilirubin: 0.5 mg/dL (ref 0.3–1.2)
Total Protein: 6.8 g/dL (ref 6.5–8.1)

## 2020-12-14 LAB — TROPONIN I (HIGH SENSITIVITY)
Troponin I (High Sensitivity): 38 ng/L — ABNORMAL HIGH (ref <18)
Troponin I (High Sensitivity): 45 ng/L — ABNORMAL HIGH (ref <18)
Troponin I (High Sensitivity): 56 ng/L — ABNORMAL HIGH (ref <18)

## 2020-12-14 LAB — URINALYSIS, ROUTINE W REFLEX MICROSCOPIC
Bilirubin Urine: NEGATIVE
Glucose, UA: NEGATIVE mg/dL
Ketones, ur: NEGATIVE mg/dL
Nitrite: POSITIVE — AB
Protein, ur: NEGATIVE mg/dL
Specific Gravity, Urine: 1.02 (ref 1.005–1.030)
pH: 6 (ref 5.0–8.0)

## 2020-12-14 LAB — I-STAT VENOUS BLOOD GAS, ED
Acid-Base Excess: 1 mmol/L (ref 0.0–2.0)
Bicarbonate: 25.7 mmol/L (ref 20.0–28.0)
Calcium, Ion: 1.2 mmol/L (ref 1.15–1.40)
HCT: 55 % — ABNORMAL HIGH (ref 39.0–52.0)
Hemoglobin: 18.7 g/dL — ABNORMAL HIGH (ref 13.0–17.0)
O2 Saturation: 100 %
Potassium: 4.3 mmol/L (ref 3.5–5.1)
Sodium: 154 mmol/L — ABNORMAL HIGH (ref 135–145)
TCO2: 27 mmol/L (ref 22–32)
pCO2, Ven: 40.9 mmHg — ABNORMAL LOW (ref 44.0–60.0)
pH, Ven: 7.407 (ref 7.250–7.430)
pO2, Ven: 194 mmHg — ABNORMAL HIGH (ref 32.0–45.0)

## 2020-12-14 LAB — PROTIME-INR
INR: 1.5 — ABNORMAL HIGH (ref 0.8–1.2)
Prothrombin Time: 17.9 seconds — ABNORMAL HIGH (ref 11.4–15.2)

## 2020-12-14 LAB — BASIC METABOLIC PANEL
BUN: 29 — AB (ref 4–21)
CO2: 20 (ref 13–22)
Chloride: 123 — AB (ref 99–108)
Creatinine: 1 (ref 0.6–1.3)
Glucose: 148
Potassium: 3.9 (ref 3.4–5.3)
Sodium: 156 — AB (ref 137–147)

## 2020-12-14 LAB — MAGNESIUM
Magnesium: 2.6
Magnesium: 2.3 mg/dL (ref 1.7–2.4)
Phosphorus: 4.3

## 2020-12-14 LAB — LACTIC ACID, PLASMA
Lactic Acid, Venous: 1.8 mmol/L (ref 0.5–1.9)
Lactic Acid, Venous: 1.5 mmol/L (ref 0.5–1.9)
Lactic Acid, Venous: 1.8 mmol/L (ref 0.5–1.9)

## 2020-12-14 LAB — AMMONIA: Ammonia: 25 umol/L (ref 9–35)

## 2020-12-14 LAB — TSH: TSH: 1.567 u[IU]/mL (ref 0.350–4.500)

## 2020-12-14 LAB — URINALYSIS, MICROSCOPIC (REFLEX): WBC, UA: >50 WBC/hpf (ref 0–5)

## 2020-12-14 LAB — RESP PANEL BY RT-PCR (FLU A&B, COVID) ARPGX2
Influenza A by PCR: NEGATIVE
Influenza B by PCR: NEGATIVE
SARS Coronavirus 2 by RT PCR: NEGATIVE

## 2020-12-14 MED ORDER — FINASTERIDE 5 MG PO TABS
5.0000 mg | ORAL_TABLET | Freq: Every day | ORAL | Status: DC
  Administered 2020-12-14 – 2021-01-05 (×23): 5 mg via ORAL
  Filled 2020-12-14 (×23): qty 1

## 2020-12-14 MED ORDER — LACTATED RINGERS IV SOLN: INTRAVENOUS | Status: DC

## 2020-12-14 MED ORDER — APIXABAN 5 MG PO TABS
5.0000 mg | ORAL_TABLET | Freq: Two times a day (BID) | ORAL | Status: DC
  Administered 2020-12-14 – 2020-12-15 (×2): 5 mg via ORAL
  Filled 2020-12-14 (×3): qty 1

## 2020-12-14 MED ORDER — SODIUM CHLORIDE 0.9 % IV SOLN: 1.0000 g | INTRAVENOUS | Status: DC

## 2020-12-14 MED ORDER — SODIUM CHLORIDE 0.9 % IV SOLN
1.0000 g | Freq: Once | INTRAVENOUS | Status: AC
  Administered 2020-12-14: 1 g via INTRAVENOUS
  Filled 2020-12-14: qty 10

## 2020-12-14 MED ORDER — SODIUM CHLORIDE 0.45 % IV SOLN: INTRAVENOUS | Status: DC

## 2020-12-14 MED ORDER — LACTATED RINGERS IV BOLUS
1000.0000 mL | Freq: Once | INTRAVENOUS | Status: AC
  Administered 2020-12-14: 1000 mL via INTRAVENOUS

## 2020-12-14 MED ORDER — LACTATED RINGERS IV SOLN: 125.0000 mL | INTRAVENOUS | 0 refills | Status: DC

## 2020-12-14 MED ORDER — SODIUM CHLORIDE 0.9% FLUSH
3.0000 mL | Freq: Two times a day (BID) | INTRAVENOUS | Status: DC
  Administered 2020-12-15 – 2020-12-26 (×22): 3 mL via INTRAVENOUS

## 2020-12-14 MED ORDER — LEVETIRACETAM 100 MG/ML PO SOLN
500.0000 mg | Freq: Two times a day (BID) | ORAL | Status: DC
  Administered 2020-12-15: 500 mg via ORAL
  Filled 2020-12-14 (×2): qty 5

## 2020-12-14 MED ORDER — LEVETIRACETAM 500 MG PO TABS
500.0000 mg | ORAL_TABLET | Freq: Two times a day (BID) | ORAL | Status: DC
  Filled 2020-12-14: qty 1

## 2020-12-14 MED ORDER — DOFETILIDE 500 MCG PO CAPS
500.0000 ug | ORAL_CAPSULE | Freq: Two times a day (BID) | ORAL | Status: DC
  Administered 2020-12-15 – 2020-12-18 (×7): 500 ug via ORAL
  Filled 2020-12-14 (×9): qty 1

## 2020-12-14 MED ORDER — GLUCERNA SHAKE PO LIQD
237.0000 mL | Freq: Two times a day (BID) | ORAL | Status: DC
  Administered 2020-12-15 – 2020-12-16 (×4): 237 mL via ORAL
  Filled 2020-12-14: qty 237

## 2020-12-14 MED ORDER — SODIUM CHLORIDE 0.9 % IV SOLN: 250.0000 mL | INTRAVENOUS | Status: DC | PRN

## 2020-12-14 MED ORDER — SODIUM CHLORIDE 0.9% FLUSH: 3.0000 mL | INTRAVENOUS | Status: DC | PRN

## 2020-12-14 MED ORDER — TAMSULOSIN HCL 0.4 MG PO CAPS
0.4000 mg | ORAL_CAPSULE | Freq: Every day | ORAL | Status: DC
  Administered 2020-12-14 – 2020-12-25 (×12): 0.4 mg via ORAL
  Filled 2020-12-14 (×13): qty 1

## 2020-12-14 NOTE — H&P (Signed)
History and Physical    [name] [name] NUU:[id number] DOB: 02/16/1941 DOA: 12/14/2020  PCP: [name], MD Patient coming from: Nursing home [facility]  Chief Complaint: Lethargy and change in mental status  HPI: [name] [name] is a 80 y.o. male with medical history significant of stroke, right subdural hematoma right craniotomy, he had an acute stroke 6/3 through 08/15/2020 had endovascular revascularization of ICA by Dr. Estanislado Pandy Inpatient rehab from 08/15/2020 through 09/17/2020 after an acute ischemic right MCA stroke prior to that he was in the hospital from Jul 27, 2020 through Jul 31, 2020 for right subdural hematoma and right craniotomy, chronic atrial fibrillation on Eliquis, congestive heart failure, history of aortic and mitral valve replacement patient was discharged to skilled nursing facility after inpatient rehab at [facility] where he was making very slow progress.  Family is very involved with his care especially his wife.  She noticed that patient was more lethargic not responding as usual with decreased p.o. intake.  Labs drawn in the nursing home sodium was 154 he got 2 bags of IV fluids with the sodium coming down to 148 and his mental status improved 3 days prior to admission however he became lethargic again with decreased responsiveness BP was low at 98/52 with weight loss of about 10 pounds in the past few weeks.  Wife reported that he did not have any fever nausea vomiting diarrhea shortness of breath or cough chest pain or abdominal pain.  She did tell me that he is prone to urinary tract infections. Patient has been sleeping most of the time, he is on trazodone for insomnia.  No reported seizure activity or incontinence or tongue biting.   ED Course: White count is 12 hemoglobin is 11.7 platelets 119 sodium 154, creatinine 1.18 with a baseline creatinine of around 1. Chest x-ray no acute findings CT head shows no acute findings, small right subdural hematoma 3 mm compared with  5 mm previously.  No new hemorrhage.  Old right MCA and left parietal infarcts are stable.  Atrophy and chronic microvascular disease.  Review of Systems: As per HPI otherwise all other systems reviewed and are negative  Ambulatory Status: Not ambulatory since stroke prior to that he was living at home with his wife and was ambulatory and functional.  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    Other and unspecified hyperlipidemia    Pre-diabetes    Previous back surgery 1978, jan 2007   Psychosexual dysfunction with inhibited sexual excitement    SOB (shortness of breath)    "with heavy exercise"   Stroke ([place]) 08/2013   "I WAS IN AFIB AND THREW A CLOT, THE EFFECTS WERE TRANSITORY AND DIDNT LAST BUT FOR 30 MINUTES"    Thoracic aortic aneurysm     Past Surgical History:  Procedure Laterality Date   AORTIC AND MITRAL VALVE REPLACEMENT  09/2004   CARDIOVERSION     3 times from 2004-2006   CARDIOVERSION N/A 09/26/2013   Procedure: CARDIOVERSION;  Surgeon: Larey Dresser, MD;  Location: [place];  Service: Cardiovascular;  Laterality: N/A;   CARDIOVERSION N/A 06/19/2014   Procedure: CARDIOVERSION;  Surgeon: Jerline Pain, MD;  Location: [place];  Service: Cardiovascular;  Laterality: N/A;   CARDIOVERSION N/A 04/24/2017   Procedure: CARDIOVERSION;  Surgeon: Larey Dresser, MD;  Location: [facility] ENDOSCOPY;  Service: Cardiovascular;  Laterality: N/A;   CARDIOVERSION N/A 06/08/2017   Procedure:  CARDIOVERSION;  Surgeon: Pixie Casino, MD;  Location: [facility] ENDOSCOPY;  Service: Cardiovascular;  Laterality: N/A;   CARDIOVERSION N/A 08/24/2017   Procedure: CARDIOVERSION;  Surgeon: Skeet Latch, MD;  Location: [facility] ENDOSCOPY;  Service: Cardiovascular;  Laterality: N/A;   CARDIOVERSION N/A 02/14/2018   Procedure: CARDIOVERSION;  Surgeon: Larey Dresser, MD;  Location: [facility] ENDOSCOPY;  Service: Cardiovascular;  Laterality: N/A;   COLONOSCOPY     CRANIOTOMY N/A 07/28/2020   Procedure: CRANIOTOMY FOR EVACUATION OF SUBDURAL HEMATOMA;  Surgeon: Eustace Denapoli, MD;  Location: [place];  Service: Neurosurgery;  Laterality: N/A;   IR ANGIO VERTEBRAL SEL SUBCLAVIAN INNOMINATE UNI R MOD SED  08/07/2020   IR CT HEAD LTD  08/07/2020   IR PERCUTANEOUS ART THROMBECTOMY/INFUSION INTRACRANIAL INC DIAG ANGIO  08/07/2020   laminectomies     10/1975 and in 03/2005   RADIOLOGY WITH ANESTHESIA N/A 08/07/2020   Procedure: IR WITH ANESTHESIA;  Surgeon: Luanne Bras, MD;  Location: [place];  Service: Radiology;  Laterality: N/A;   TONSILLECTOMY     1950   TOTAL HIP ARTHROPLASTY Right 04/03/2018   Procedure: TOTAL HIP ARTHROPLASTY ANTERIOR APPROACH;  Surgeon: Paralee Cancel, MD;  Location: [facility];  Service: Orthopedics;  Laterality: Right;  70 mins    Social History   Socioeconomic History   Marital status: Married    Spouse name: Herbert Pun   Number of children: 2   Years of education: BS   Highest education level: Not on file  Occupational History   Occupation: retired  Tobacco Use   Smoking status: Never   Smokeless tobacco: Never  Vaping Use   Vaping Use: Never used  Substance and Sexual Activity   Alcohol use: Never    Comment: wine occasionally   Drug use: Never   Sexual activity: Yes    Partners: Female  Other Topics Concern   Not on file  Social History Narrative   10/27/20 residing at [name] care center since 09/17/20    [employer] - BS, JD. married - '67. 2 sons - '74, '77  one son is  a Nurse, learning disability for [employer]; 4 grandchildren. 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 marriage in good health. End-of-life: discussed issues and provided packet with the MOST form and out of facility order.   Social Determinants of Health   Financial Resource Strain: Not on file  Food Insecurity: Not on file  Transportation Needs: Not on file  Physical Activity: Not on file  Stress: Not on file  Social Connections: Not on file  Intimate Partner Violence: Not on file    Allergies  Allergen Reactions   Naproxen Hives   No [facility] Food Allergies Other (See Comments)    Scallops - distress, nausea and vomitting    Family History  Problem Relation Age of Onset   Macular degeneration Mother  macular degeneration   Cancer Father        intestinal/GI   Diabetes Paternal Aunt    Heart attack Maternal Grandfather    Diabetes Brother       Prior to Admission medications   Medication Sig Start Date End Date Taking? Authorizing Provider  apixaban (ELIQUIS) 5 MG TABS tablet Take 1 tablet (5 mg total) by mouth 2 (two) times daily. 09/16/20  Yes Love, Ivan Anchors, PA-C  atorvastatin (LIPITOR) 80 MG tablet Take 1 tablet (80 mg total) by mouth daily. 09/15/20  Yes Love, Ivan Anchors, PA-C  clobetasol cream (TEMOVATE) 5.62 % Apply 1 application topically daily as needed (for skin irritation).  05/20/14  Yes [provider]  dofetilide (TIKOSYN) 500 MCG capsule Take 1 capsule (500 mcg total) by mouth 2 (two) times daily. 03/26/20  Yes Larey Dresser, MD  famotidine (PEPCID) 20 MG tablet Take 20 mg by mouth at bedtime as needed.   Yes [provider]  feeding supplement, GLUCERNA SHAKE, (GLUCERNA SHAKE) LIQD Take 237 mLs by mouth 2 (two) times daily between meals. For weight loss   Yes [provider]  finasteride (PROSCAR) 5 MG tablet Take 5 mg by mouth daily.   Yes [provider]   hydrALAZINE (APRESOLINE) 10 MG tablet Take 10 mg by mouth every 6 (six) hours as needed (For b/p greater than 150/90).   Yes [provider]  hydrocortisone cream 1 % Apply topically 2 (two) times daily as needed for itching (rash). 09/08/20  Yes Love, Ivan Anchors, PA-C  lactated ringers infusion Inject 125 mLs into the vein continuous. 12/14/20  Yes Blanchie Dessert, MD  levETIRAcetam (KEPPRA) 500 MG tablet Take 1 tablet (500 mg total) by mouth 2 (two) times daily. 09/17/20  Yes Love, Ivan Anchors, PA-C  loratadine (CLARITIN) 10 MG tablet Take 10 mg by mouth at bedtime.   Yes [provider]  losartan (COZAAR) 25 MG tablet Place 1 tablet (25 mg total) into feeding tube daily. Patient taking differently: Take 25 mg by mouth daily. 08/15/20  Yes Pokhrel, L[place]man, MD  magnesium gluconate (MAGONATE) 500 MG tablet Take 500 mg by mouth daily.   Yes [provider]  metoprolol tartrate (LOPRESSOR) 25 MG tablet Take 1 tablet (25 mg total) by mouth 2 (two) times daily. 09/15/20  Yes Love, Ivan Anchors, PA-C  polyethylene glycol (MIRALAX / GLYCOLAX) 17 g packet Take 17 g by mouth daily as needed.   Yes [provider]  polyvinyl alcohol (LIQUIFILM TEARS) 1.4 % ophthalmic solution Place 1 drop into both eyes 2 (two) times daily. 09/15/20  Yes Love, Ivan Anchors, PA-C  potassium chloride (KLOR-CON) 20 MEQ packet Take 40 mEq by mouth 2 (two) times daily. 09/17/20  Yes Love, Ivan Anchors, PA-C  sennosides-docusate sodium (SENOKOT-S) 8.6-50 MG tablet Take 2 tablets by mouth at bedtime.   Yes [provider]  sertraline (ZOLOFT) 25 MG tablet Take 25 mg by mouth at bedtime.   Yes [provider]  tamsulosin (FLOMAX) 0.4 MG CAPS capsule Take 0.4 mg by mouth daily.   Yes [provider]  traZODone (DESYREL) 50 MG tablet Take 50 mg by mouth at bedtime.   Yes [provider]    Physical Exam: Vitals:   12/14/20 1300 12/14/20 1315 12/14/20 1430 12/14/20 1445  BP: 94/74  98/70 (!) 122/91 123/82  Pulse: 95 94 88 88  Resp: (!) 24 (!) 22 20 20   Temp:      TempSrc:  SpO2: 94% 96% 96% 96%  Weight:      Height:         General: Patient is resting in bed he does not follow any commands or answer questions however when I called his name and I told him to open his eyes he did look at me once and closed his eyes back again Eyes:  PERRL, EOMI, normal lids, iris ENT:  grossly normal hearing, lips & tongue, mmm Neck:  no LAD, masses or thyromegaly Cardiovascular:  RRR, no m/r/g. No LE edema.  Respiratory:  CTA bilaterally, no w/r/r. Normal respiratory effort. Abdomen:  soft, ntnd, NABS Skin:  no rash or induration seen on limited exam Musculoskeletal:  grossly normal tone BUE/BLE, good ROM, no bony abnormality Psychiatric:  grossly normal mood and affect, speech fluent and appropriate, AOx3 Neurologic: Does not follow commands or answer questions appropriately.  Labs on Admission: I have personally reviewed following labs and imaging studies  CBC: Recent Labs  Lab 12/10/20 0000 12/14/20 1245 12/14/20 1504  WBC 11.6 12.0*  --   NEUTROABS  --  9.3*  --   HGB 12.9* 11.7* 18.7*  HCT 40* 36.8* 55.0*  MCV  --  90.0  --   PLT 159 119*  --    Basic Metabolic Panel: Recent Labs  Lab 12/10/20 0000 12/11/20 0000 12/12/20 0000 12/13/20 0000 12/14/20 1245 12/14/20 1504  NA  --  154* 148* 150* 149* 154*  K 4.9 4.2 4.2 3.8 4.1 4.3  CL 119* 123* 118* 120* 119*  --   CO2 21 21 21 22 22   --   GLUCOSE  --   --   --   --  204*  --   BUN 41* 37* 32* 29* 32*  --   CREATININE 1.2 1.1 1.1 1.0 1.18  --   CALCIUM 8.9 8.7 8.5* 8.3* 8.6*  --   MG 2.6  --   --   --   --   --   PHOS 4.3  --   --   --   --   --    GFR: Estimated Creatinine Clearance: 48.2 mL/min (by C-G formula based on SCr of 1.18 mg/dL). Liver Function Tests: Recent Labs  Lab 12/10/20 0000 12/11/20 0000 12/12/20 0000 12/14/20 1245  AST 55* 39 47* 63*  ALT 77* 62* 78* 92*  ALKPHOS 109  93 82 68  BILITOT  --   --   --  0.5  PROT  --   --   --  6.8  ALBUMIN 3.3* 2.8* 2.9* 2.3*   No results for input(s): LIPASE, AMYLASE in the last 168 hours. No results for input(s): AMMONIA in the last 168 hours. Coagulation Profile: Recent Labs  Lab 12/14/20 1245  INR 1.5*   Cardiac Enzymes: No results for input(s): CKTOTAL, CKMB, CKMBINDEX, TROPONINI in the last 168 hours. BNP (last 3 results) No results for input(s): PROBNP in the last 8760 hours. HbA1C: No results for input(s): HGBA1C in the last 72 hours. CBG: No results for input(s): GLUCAP in the last 168 hours. Lipid Profile: No results for input(s): CHOL, HDL, LDLCALC, TRIG, CHOLHDL, LDLDIRECT in the last 72 hours. Thyroid Function Tests: No results for input(s): TSH, T4TOTAL, FREET4, T3FREE, THYROIDAB in the last 72 hours. Anemia Panel: No results for input(s): VITAMINB12, FOLATE, FERRITIN, TIBC, IRON, RETICCTPCT in the last 72 hours. Urine analysis:    Component Value Date/Time   COLORURINE YELLOW 12/14/2020 1600   APPEARANCEUR CLOUDY (A) 12/14/2020 1600  LABSPEC 1.020 12/14/2020 1600   PHURINE 6.0 12/14/2020 1600   GLUCOSEU NEGATIVE 12/14/2020 1600   HGBUR LARGE (A) 12/14/2020 1600   BILIRUBINUR NEGATIVE 12/14/2020 1600   BILIRUBINUR negative 11/22/2013 0848   KETONESUR NEGATIVE 12/14/2020 1600   PROTEINUR NEGATIVE 12/14/2020 1600   UROBILINOGEN 0.2 11/22/2013 0848   UROBILINOGEN 1.0 08/05/2013 2207   NITRITE POSITIVE (A) 12/14/2020 1600   LEUKOCYTESUR LARGE (A) 12/14/2020 1600    Creatinine Clearance: Estimated Creatinine Clearance: 48.2 mL/min (by C-G formula based on SCr of 1.18 mg/dL).  Sepsis Labs: @LABRCNTIP (procalcitonin:4,lacticidven:4) )No results found for this or any previous visit (from the past 240 hour(s)).   Radiological Exams on Admission: CT Head Wo Contrast  Result Date: 12/14/2020 CLINICAL DATA:  Mental status change, unknown cause EXAM: CT HEAD WITHOUT CONTRAST TECHNIQUE:  Contiguous axial images were obtained from the base of the skull through the vertex without intravenous contrast. 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 Old left parietal infarct. Findings are stable since prior study. Small right subdural hematoma again noted overlying the right frontoparietal lobe, 3 mm compared to 5 mm previously. There is atrophy and chronic small vessel disease changes. Ventriculomegaly likely rib related to ex vacuo dilatation. No acute infarct or acute hemorrhage. Vascular: No hyperdense vessel or unexpected calcification. Skull: Prior right temporoparietal craniotomy. No acute calvarial abnormality. Sinuses/Orbits: No acute findings Other: None IMPRESSION: Small right subdural hematoma, 3 mm compared with 5 mm previously. No new hemorrhage. Old right MCA and left parietal infarcts, stable. Ventriculomegaly related to ex vacuo dilatation. Atrophy, chronic microvascular disease. No acute intracranial abnormality. Electronically Signed   By: Rolm Baptise M.D.   On: 12/14/2020 13:50   DG Chest Port 1 View  Result Date: 12/14/2020 CLINICAL DATA:  Altered mental status EXAM: PORTABLE CHEST 1 VIEW COMPARISON:  Chest radiograph 08/07/2020 FINDINGS: Median sternotomy wires and mitral and aortic valve prostheses are stable. The cardiomediastinal silhouette is stable. There is calcified atherosclerotic plaque of the aortic arch. Lung volumes are low. There is no focal consolidation or pulmonary edema. There is no pleural effusion or pneumothorax. There is no acute osseous abnormality. IMPRESSION: Low lung volumes. Otherwise, no radiographic evidence of acute cardiopulmonary process. Electronically Signed   By: Valetta Mole M.D.   On: 12/14/2020 13:01    EKG: nsr  Assessment/Plan Active Problems:   Pressure injury of skin   Acute hypernatremia   #1 acute change in mental status likely due to dehydration, UTI and hypernatremia.  Patient with  decreased p.o. intake and significant weight loss in the last few weeks.  Sodium is 154. We will start him on half-normal saline at 75 cc an hour and Rocephin 1 g daily and follow-up labs in AM. No reported seizure activity continue Keppra  #2 UTI UA with large amount of leukocytes and positive for nitrates.  Patient with history of recurrent UTIs in the past.  We will start him on Rocephin and follow-up urine culture. He does have leukocytosis.  #3 hypotension highly likely due to dehydration and UTI.  Lactic acid normal check procalcitonin follow lactic acid Follow-up blood cultures Hold metoprolol and Cozaar  #4 elevated troponin mildly elevated with no EKG changes.  Trend troponin  #5  History of AVR and MVR on Eliquis  #6 atrial fibrillation on Eliquis and Tikosyn and metoprolol.  Check magnesium level  #7 diastolic heart failure EF of 60 to 65% from 6/22 with severe left ventricular hypertrophy and diastolic dysfunction careful IV  hydration.  #8 seizure secondary to history of stroke and subdural hemorrhage continue Keppra  #9 hyperlipidemia on Lipitor  #10 insomnia hold trazodone and Zoloft  #11 stage II left elbow pressure injury present on admission   RN Pressure Injury Documentation: Pressure Injury 12/14/20 Elbow Left;Posterior Stage 2 -  Partial thickness loss of dermis presenting as a shallow open injury with a red, pink wound bed without slough. Pressure wound is round in shape. (Active)  12/14/20 1250  Location: Elbow  Location Orientation: Left;Posterior  Staging: Stage 2 -  Partial thickness loss of dermis presenting as a shallow open injury with a red, pink wound bed without slough.  Wound Description (Comments): Pressure wound is round in shape.  Present on Admission: Yes     Estimated body mass index is 26.03 kg/m as calculated from the following:   Height as of this encounter: 5\' 7"  (1.702 m).   Weight as of this encounter: 75.4 kg.   DVT prophylaxis:  Eliquis  code Status: Full code Family Communication: Discussed with wife Herbert Pun at bedside Disposition Plan: Pending clinical improvement Consults called: None Admission status: Observation   Georgette Shell MD  12/14/2020, 4:28 PM

## 2020-12-14 NOTE — ED Provider Notes (Addendum)
  Physical Exam  BP 123/82   Pulse 88   Temp 99.3 F (37.4 C) (Rectal)   Resp 20   Ht 5\' 7"  (1.702 m)   Wt 75.4 kg   SpO2 96%   BMI 26.03 kg/m   Physical Exam  ED Course/Procedures     Procedures  MDM  Care assumed at 3 pm. Patient here with hypernatremia. Patient had received 2 L NS at [facility] over last several days with no improvement. Still confused. Sodium is 149 in the ED. CT head showed smaller subdural hemorrhage. Will admit for hypernatremia and AMS. Ordered another LR bolus in the ED. UA pending. Hospitalist to admit   4:42 PM UA + UTI. Given rocephin. Urine culture sent.    Drenda Freeze, MD 12/14/20 1604    Drenda Freeze, MD 12/14/20 [phone]

## 2020-12-14 NOTE — Progress Notes (Addendum)
Location:   [facility] Room Number: [place] of Service:  SNF [phone]) Provider:  Veleta Miners MD  [name], MD  Patient Care Team: [name], MD as PCP - General (Internal Medicine) [name], MD as Consulting Physician (Ophthalmology) [name], MD (Internal Medicine)  Extended Emergency Contact Information Primary Emergency Contact: [name] Address: [place], [place] [address] [name] of [place] Phone: [phone] Relation: Spouse Secondary Emergency Contact: [name], [place] [address] [name] of [place] Phone: [phone] Relation: Son  Code Status:  DNR Goals of care: Advanced Directive information Advanced Directives 12/14/2020  Does Patient Have a Medical Advance Directive? Yes  Type of Paramedic of [place];Out of facility DNR (pink MOST or yellow form)  Does patient want to make changes to medical advance directive? No - Patient declined  Copy of [place] in Chart? Yes - validated most recent copy scanned in chart (See row information)  Would patient like information on creating a medical advance directive? -  Pre-existing out of facility DNR order (yellow form or pink MOST form) -     Chief Complaint  Patient presents with   Acute Visit    HPI:  Pt is a 80 y.o. male seen today for an acute visit for Lethargy and Change in mental status  Patient has h/o   in Inpatient Rehab from 06/11-07/14 for Acute Ischemic Right MCA Stroke   Also in the hospital from 05/23-05/27 for Right Subdural Hematoma s/p Right Craniotomy 06/3 -6/11 Patient had Acute Ischemic Stroke Underwent Endovascular Revascularization of ICA Followed by Intubation and Hypertonic Saline infusion   Patient had been making slow Progress in Rehab in SNF   Was Noticed by wife and Nurses on Thurs to be lethargic Blood work showed  Sodium of 154 Given IV fluids got better Sodium came back to 148 on Fri.  But this morning he is lethargic again. Not responding. BP is low at 98/52 Dry Lips and Mouth Has lost 10 lbs in past few weeks. 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    Other and unspecified hyperlipidemia    Pre-diabetes    Previous back surgery 1978, jan 2007   Psychosexual dysfunction with inhibited sexual excitement    SOB (shortness of breath)    "with heavy exercise"   Stroke ([place]) 08/2013   "I WAS IN AFIB AND THREW A CLOT, THE EFFECTS WERE TRANSITORY AND DIDNT LAST BUT FOR 30 MINUTES"    Thoracic aortic aneurysm    Past Surgical History:  Procedure Laterality Date   AORTIC AND MITRAL VALVE REPLACEMENT     09/2004   CARDIOVERSION     3 times  from 2004-2006   CARDIOVERSION N/A 09/26/2013   Procedure: CARDIOVERSION;  Surgeon: Larey Dresser, MD;  Location: [facility];  Service: Cardiovascular;  Laterality: N/A;   CARDIOVERSION N/A 06/19/2014   Procedure: CARDIOVERSION;  Surgeon: Jerline Pain, MD;  Location: [facility];  Service: Cardiovascular;  Laterality: N/A;   CARDIOVERSION N/A 04/24/2017   Procedure: CARDIOVERSION;  Surgeon: Larey Dresser, MD;  Location: [facility] ENDOSCOPY;  Service: Cardiovascular;  Laterality: N/A;   CARDIOVERSION N/A 06/08/2017   Procedure: CARDIOVERSION;  Surgeon: Pixie Casino, MD;  Location: [facility] ENDOSCOPY;  Service: Cardiovascular;  Laterality: N/A;    CARDIOVERSION N/A 08/24/2017   Procedure: CARDIOVERSION;  Surgeon: Skeet Latch, MD;  Location: [facility] ENDOSCOPY;  Service: Cardiovascular;  Laterality: N/A;   CARDIOVERSION N/A 02/14/2018   Procedure: CARDIOVERSION;  Surgeon: Larey Dresser, MD;  Location: [facility] ENDOSCOPY;  Service: Cardiovascular;  Laterality: N/A;   COLONOSCOPY     CRANIOTOMY N/A 07/28/2020   Procedure: CRANIOTOMY FOR EVACUATION OF SUBDURAL HEMATOMA;  Surgeon: Eustace Aigner, MD;  Location: [facility];  Service: Neurosurgery;  Laterality: N/A;   IR ANGIO VERTEBRAL SEL SUBCLAVIAN INNOMINATE UNI R MOD SED  08/07/2020   IR CT HEAD LTD  08/07/2020   IR PERCUTANEOUS ART THROMBECTOMY/INFUSION INTRACRANIAL INC DIAG ANGIO  08/07/2020   laminectomies     10/1975 and in 03/2005   RADIOLOGY WITH ANESTHESIA N/A 08/07/2020   Procedure: IR WITH ANESTHESIA;  Surgeon: Luanne Bras, MD;  Location: [facility];  Service: Radiology;  Laterality: N/A;   TONSILLECTOMY     1950   TOTAL HIP ARTHROPLASTY Right 04/03/2018   Procedure: TOTAL HIP ARTHROPLASTY ANTERIOR APPROACH;  Surgeon: Paralee Cancel, MD;  Location: [facility];  Service: Orthopedics;  Laterality: Right;  70 mins    Allergies  Allergen Reactions   Naproxen Hives   No [facility] Food Allergies Other (See Comments)    Scallops - distress, nausea and vomitting    Allergies as of 12/14/2020       Reactions   Naproxen Hives   No [facility] Food Allergies Other (See Comments)   Scallops - distress, nausea and vomitting        Medication List        Accurate as of December 14, 2020 10:12 AM. If you have any questions, ask your nurse or doctor.          acetaminophen 325 MG tablet Commonly known as: TYLENOL Take 650 mg by mouth every 4 (four) hours as needed for mild pain.   amantadine 50 MG/5ML solution Commonly known as: SYMMETREL as directed. amt: 200 mg/20 mL; oral  Special Instructions: Per Dr, please administer medication as close to these times as possible Once A Morning;  08:00 AM - 09:00 AM   amantadine 50 MG/5ML solution Commonly known as: SYMMETREL Take 100 mg by mouth daily. 12:00 pm, see other instructions for morning dosing   atorvastatin 80 MG tablet Commonly known as: LIPITOR Take 1 tablet (80 mg total) by mouth daily.   clobetasol cream 0.05 % Commonly known as: TEMOVATE Apply 1 application topically daily as needed (for skin irritation).   dofetilide 500 MCG capsule Commonly known as: TIKOSYN Take 1 capsule (500 mcg total) by mouth 2 (two) times daily.   Eliquis 5 MG Tabs tablet Generic drug: apixaban Take 1 tablet (5 mg total) by mouth 2 (two) times daily.   famotidine 20 MG tablet Commonly known as: PEPCID Take 20 mg by mouth at bedtime as needed.   feeding  supplement (GLUCERNA SHAKE) Liqd Take 237 mLs by mouth 2 (two) times daily between meals. For weight loss   finasteride 5 MG tablet Commonly known as: PROSCAR Take 5 mg by mouth daily.   hydrALAZINE 10 MG tablet Commonly known as: APRESOLINE Take 10 mg by mouth every 6 (six) hours as needed (For b/p greater than 150/90).   hydrocortisone cream 1 % Apply topically 2 (two) times daily as needed for itching (rash).   levETIRAcetam 500 MG tablet Commonly known as: KEPPRA Take 1 tablet (500 mg total) by mouth 2 (two) times daily.   loratadine 10 MG tablet Commonly known as: CLARITIN Take 10 mg by mouth at bedtime.   losartan 25 MG tablet Commonly known as: COZAAR Place 1 tablet (25 mg total) into feeding tube daily.   magnesium gluconate 500 MG tablet Commonly known as: MAGONATE Take 500 mg by mouth daily.   metoprolol tartrate 25 MG tablet Commonly known as: LOPRESSOR Take 1 tablet (25 mg total) by mouth 2 (two) times daily.   polyethylene glycol 17 g packet Commonly known as: MIRALAX / GLYCOLAX Take 17 g by mouth daily as needed.   polyvinyl alcohol 1.4 % ophthalmic solution Commonly known as: LIQUIFILM TEARS Place 1 drop into both eyes 2 (two) times  daily.   potassium chloride 20 MEQ packet Commonly known as: KLOR-CON Take 40 mEq by mouth 2 (two) times daily.   sennosides-docusate sodium 8.6-50 MG tablet Commonly known as: SENOKOT-S Take 2 tablets by mouth at bedtime.   sertraline 25 MG tablet Commonly known as: ZOLOFT Take 25 mg by mouth at bedtime.   tamsulosin 0.4 MG Caps capsule Commonly known as: FLOMAX Take 0.4 mg by mouth daily.   traZODone 50 MG tablet Commonly known as: DESYREL Take 50 mg by mouth at bedtime.        Review of Systems  Unable to perform ROS: Mental status change  Constitutional:  Positive for activity change, appetite change and unexpected weight change.  HENT: Negative.    Respiratory: Negative.    Cardiovascular: Negative.   Gastrointestinal: Negative.   Genitourinary: Negative.   Musculoskeletal:  Positive for gait problem.  Skin: Negative.   Psychiatric/Behavioral:  Positive for confusion.    Immunization History  Administered Date(s) Administered   Fluad Quad(high Dose 65+) 12/06/2018   Influenza Whole 12/25/2008, 01/05/2010, 12/30/2011   Influenza, High Dose Seasonal PF 12/14/2016   Influenza,inj,Quad PF,6+ Mos 12/19/2013   Influenza,inj,quad, With Preservative 11/22/2017   Influenza-Unspecified 12/14/2015, 12/24/2019   Moderna SARS-COV2 Booster Vaccination 01/02/2020, 06/26/2020   Moderna Sars-Covid-2 Vaccination 04/08/2019, 05/05/2019   Pneumococcal Conjugate-13 12/19/2013   Pneumococcal Polysaccharide-23 09/24/2007, 05/22/2017   Td 12/25/2008   Tdap 03/22/2018   Zoster Recombinat (Shingrix) 07/16/2019, 10/28/2019   Zoster, Live 09/24/2007   Pertinent  Health Maintenance Due  Topic Date Due   FOOT EXAM  Never done   INFLUENZA VACCINE  10/05/2020   OPHTHALMOLOGY EXAM  04/07/2021 (Originally 10/04/1950)   HEMOGLOBIN A1C  02/07/2021   Fall Risk  11/11/2020 07/11/2019 06/27/2018 05/23/2017 05/22/2017  Falls in the past year? 0 0 0 No No  Number falls in past yr: 0 0 0 - -   Injury with Fall? 0 0 - - -  Risk for fall due to : - Orthopedic patient - - -  Risk for fall due to: Comment - left hip pain; seeing ortho - - -  Follow up - Falls evaluation completed;Education provided - - -   Functional Status Survey:  Vitals:   12/14/20 0937  BP: 123/80  Pulse: 96  Resp: (!) 24  Temp: (!) 97.5 F (36.4 C)  SpO2: 96%  Weight: 166 lb 3.2 oz (75.4 kg)  Height: 5\' 7"  (1.702 m)   Body mass index is 26.03 kg/m. Physical Exam Vitals reviewed.  Constitutional:      Comments: Lethargic  HENT:     Head: Normocephalic.     Nose: Nose normal.     Mouth/Throat:     Mouth: Mucous membranes are dry.  Eyes:     Pupils: Pupils are equal, round, and reactive to light.  Cardiovascular:     Rate and Rhythm: Tachycardia present.     Pulses: Normal pulses.  Pulmonary:     Effort: Pulmonary effort is normal. No respiratory distress.     Breath sounds: Normal breath sounds.  Abdominal:     General: Abdomen is flat. Bowel sounds are normal.     Palpations: Abdomen is soft.  Musculoskeletal:        General: No swelling.     Cervical back: Neck supple.  Skin:    General: Skin is warm.  Neurological:     Comments: Patient is lethargic Not responding to this Name Looks Dry Weakness noticed on Left side     Labs reviewed: Recent Labs    08/12/20 2154 08/13/20 0513 08/13/20 1200 08/14/20 0414 09/11/20 0402 09/15/20 0432 09/17/20 0436 09/21/20 0000 09/29/20 0000 10/16/20 0000 11/06/20 0000 12/10/20 0000 12/11/20 0000 12/12/20 0000 12/13/20 0000  NA 152*   < >  --    < > 141 140 140 139   < > 147   < >  --  154* 148* 150*  K 3.3*   < >  --    < > 4.0 3.9 4.1 4.1   < > 4.0   < > 4.9 4.2 4.2 3.8  CL 122*   < >  --    < > 109 107 107 105   < > 109*   < > 119* 123* 118* 120*  CO2 26   < >  --    < > 27 27 26  23*   < > 23*   < > 21 21 21 22   GLUCOSE 174*   < >  --    < > 109* 107* 128*  --   --   --   --   --   --   --   --   BUN 33*   < >  --    < > 27*  20 23 18    < > 17   < > 41* 37* 32* 29*  CREATININE 0.73   < >  --    < > 0.85 0.79 0.85 0.7   < > 0.8   < > 1.2 1.1 1.1 1.0  CALCIUM 7.8*   < >  --    < > 8.7* 9.1 9.1 9.2   < > 9.3   < > 8.9 8.7 8.5* 8.3*  MG 2.3   < >  --    < >  --  2.0  --  2.0  --  1.9  --  2.6  --   --   --   PHOS 1.9*  --  2.2*  --   --   --   --   --   --   --   --  4.3  --   --   --    < > =  values in this interval not displayed.   Recent Labs    07/27/20 1833 08/01/20 0610 08/16/20 0427 11/06/20 0000 12/10/20 0000 12/11/20 0000 12/12/20 0000  AST 34 39 74*   < > 55* 39 47*  ALT 33 52* 100*   < > 77* 62* 78*  ALKPHOS 65 46 63   < > 109 93 82  BILITOT 0.9 1.1 0.8  --   --   --   --   PROT 7.0 5.9* 5.2*  --   --   --   --   ALBUMIN 3.8 2.8* 2.2*  --  3.3* 2.8* 2.9*   < > = values in this interval not displayed.   Recent Labs    08/08/20 0625 08/09/20 0451 08/16/20 0427 08/17/20 0340 09/08/20 0344 09/11/20 0402 09/15/20 0432 10/16/20 0000 11/06/20 0000 12/10/20 0000  WBC 17.5* 16.4* 7.9   < > 11.8* 10.8* 11.7* 11.2 6.9 11.6  NEUTROABS 13.5* 12.6* 5.7  --   --   --   --   --   --   --   HGB 12.3* 11.6* 9.0*   < > 11.6* 11.3* 11.8* 11.5* 11.7* 12.9*  HCT 36.7* 34.6* 28.2*   < > 35.8* 35.0* 36.3* 34* 34* 40*  MCV 94.1 93.5 98.6   < > 95.7 94.9 94.3  --   --   --   PLT 254 229 177   < > 197 189 188 245 201 159   < > = values in this interval not displayed.   Lab Results  Component Value Date   TSH 2.35 10/16/2020   Lab Results  Component Value Date   HGBA1C 6.2 (H) 08/08/2020   Lab Results  Component Value Date   CHOL 119 08/08/2020   HDL 42 08/08/2020   LDLCALC 56 08/08/2020   TRIG 105 08/08/2020   CHOLHDL 2.8 08/08/2020    Significant Diagnostic Results in last 30 days:  No results found.  Assessment/Plan Patient with Mental status Change  Differential include Hypernatremia, Infection or ? New CVA  Discussed with the son and Wife Will send him to ED for further Eval and     Family/ staff Communication:   Labs/tests ordered:

## 2020-12-14 NOTE — ED Provider Notes (Signed)
[facility] EMERGENCY DEPARTMENT Provider Note   CSN: [id number] Arrival date & time: 12/14/20  1211     History Chief Complaint  Patient presents with   Altered Mental Status    NICKOLAS CHALFIN is a 80 y.o. male.  Patient is a 80 year old male with a history of atrial fibrillation, CHF, prediabetes, stroke and subdural hematoma and has been in rehab since July making slow progress in the skilled facility and was noticed 5 days prior to arrival by his wife that he was not acting normally.  He had labs drawn that showed that he had hypernatremia of 154.  He was given IV fluids with improvement of his symptoms and had done well over the weekend until this morning again he was noted to be somnolent and altered which is not his baseline.  Also noted to have low blood pressure at the facility today.  They deny patient having any fever, cough, congestion but did note dry lips and mouth.  Patient is unable to give any history.  The history is provided by the EMS personnel and medical records. The history is limited by the absence of a caregiver.  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    Other and unspecified hyperlipidemia    Pre-diabetes    Previous back surgery 1978, jan 2007   Psychosexual dysfunction with inhibited sexual excitement    SOB (shortness of breath)    "with heavy exercise"   Stroke ([place]) 08/2013   "I WAS IN AFIB AND THREW A CLOT, THE EFFECTS  WERE TRANSITORY AND DIDNT LAST BUT FOR 30 MINUTES"    Thoracic aortic aneurysm     Patient Active Problem List   Diagnosis Date Noted   Pressure injury of skin 12/14/2020   Diabetes mellitus type II, controlled ([place]) 10/15/2020   Oropharyngeal dysphagia    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[place])    Acute ischemic right MCA stroke ([place]) 08/15/2020   CVA (cerebral vascular accident) ([place]) 08/07/2020   Middle cerebral artery embolism, 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   Subdural hemorrhage following injury 07/31/2020   S/P craniotomy 07/28/2020   Subdural hematoma 07/27/2020   Vertigo 08/30/2018   S/P right THA, 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   Long term (current) use of anticoagulants 01/05/2018   History of food allergy 12/11/2017   Allergic reaction 12/11/2017   Emesis 12/11/2017   Visit for monitoring Tikosyn therapy 06/06/2017   Internal hemorrhoid, bleeding 05/19/2016   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   Aortic valve disorder 07/09/2015   Long term current use of anticoagulant therapy 01/19/2015   Atrial fibrillation, chronic ([place]) 01/20/2014   Elevated PSA 12/23/2013   Prediabetes 12/19/2013   Benign prostatic hyperplasia with urinary obstruction 10/29/2013   left frontal  cerebral infarct secondary to afib 08/05/2013   Atrial flutter ([place]) 05/17/2013   Essential hypertension 06/19/2012   Routine health maintenance 04/26/2011   Hyperlipidemia with target LDL less than 130 07/07/2008   History of colonic polyps 06/16/2008   ERECTILE DYSFUNCTION 09/25/2007    Past Surgical History:  Procedure Laterality Date   AORTIC AND MITRAL VALVE REPLACEMENT     09/2004   CARDIOVERSION     3 times from 2004-2006   CARDIOVERSION N/A 09/26/2013   Procedure: CARDIOVERSION;  Surgeon: Larey Dresser, MD;  Location: [place];  Service: Cardiovascular;  Laterality: N/A;   CARDIOVERSION N/A 06/19/2014    Procedure: CARDIOVERSION;  Surgeon: Jerline Pain, MD;  Location: [place];  Service: Cardiovascular;  Laterality: N/A;   CARDIOVERSION N/A 04/24/2017   Procedure: CARDIOVERSION;  Surgeon: Larey Dresser, MD;  Location: [facility] ENDOSCOPY;  Service: Cardiovascular;  Laterality: N/A;   CARDIOVERSION N/A 06/08/2017   Procedure: CARDIOVERSION;  Surgeon: Pixie Casino, MD;  Location: [facility] ENDOSCOPY;  Service: Cardiovascular;  Laterality: N/A;   CARDIOVERSION N/A 08/24/2017   Procedure: CARDIOVERSION;  Surgeon: Skeet Latch, MD;  Location: [facility] ENDOSCOPY;  Service: Cardiovascular;  Laterality: N/A;   CARDIOVERSION N/A 02/14/2018   Procedure: CARDIOVERSION;  Surgeon: Larey Dresser, MD;  Location: [facility] ENDOSCOPY;  Service: Cardiovascular;  Laterality: N/A;   COLONOSCOPY     CRANIOTOMY N/A 07/28/2020   Procedure: CRANIOTOMY FOR EVACUATION OF SUBDURAL HEMATOMA;  Surgeon: Eustace Ballard, MD;  Location: [place];  Service: Neurosurgery;  Laterality: N/A;   IR ANGIO VERTEBRAL SEL SUBCLAVIAN INNOMINATE UNI R MOD SED  08/07/2020   IR CT HEAD LTD  08/07/2020   IR PERCUTANEOUS ART THROMBECTOMY/INFUSION INTRACRANIAL INC DIAG ANGIO  08/07/2020   laminectomies     10/1975 and in 03/2005   RADIOLOGY WITH ANESTHESIA N/A 08/07/2020   Procedure: IR WITH ANESTHESIA;  Surgeon: Luanne Bras, MD;  Location: [place];  Service: Radiology;  Laterality: N/A;   TONSILLECTOMY     1950   TOTAL HIP ARTHROPLASTY Right 04/03/2018   Procedure: TOTAL HIP ARTHROPLASTY ANTERIOR APPROACH;  Surgeon: Paralee Cancel, MD;  Location: [facility];  Service: Orthopedics;  Laterality: Right;  70 mins       Family History  Problem Relation Age of Onset   Macular degeneration Mother        macular degeneration   Cancer Father        intestinal/GI   Diabetes Paternal Aunt    Heart attack Maternal Grandfather    Diabetes Brother     Social History   Tobacco Use   Smoking status: Never   Smokeless tobacco: Never  Vaping Use   Vaping Use:  Never used  Substance Use Topics   Alcohol use: Never    Comment: wine occasionally   Drug use: Never    Home Medications Prior to Admission medications   Medication Sig Start Date End Date Taking? Authorizing Provider  apixaban (ELIQUIS) 5 MG TABS tablet Take 1 tablet (5 mg total) by mouth 2 (two) times daily. 09/16/20  Yes Love, Ivan Anchors, PA-C  atorvastatin (LIPITOR) 80 MG tablet Take 1 tablet (80 mg total) by mouth daily. 09/15/20  Yes Love, Ivan Anchors, PA-C  clobetasol cream (TEMOVATE) 6.76 % Apply 1 application topically daily as needed (for skin irritation).  05/20/14  Yes [provider]  dofetilide (TIKOSYN) 500 MCG capsule Take 1 capsule (500 mcg total) by mouth 2 (two) times daily. 03/26/20  Yes Larey Dresser, MD  famotidine (PEPCID) 20 MG tablet Take 20 mg by mouth at bedtime as needed.   Yes [provider]  feeding supplement, GLUCERNA SHAKE, (GLUCERNA SHAKE) LIQD Take 237 mLs by mouth 2 (two) times daily between meals. For weight loss   Yes [provider]  finasteride (PROSCAR) 5 MG tablet Take 5 mg by mouth daily.   Yes [provider]  hydrALAZINE (APRESOLINE) 10 MG tablet Take 10 mg by mouth every 6 (six) hours as needed (For b/p greater than 150/90).   Yes [provider]  hydrocortisone cream 1 % Apply topically 2 (two) times daily as needed for itching (rash). 09/08/20  Yes Love, Ivan Anchors, PA-C  lactated ringers infusion Inject 125 mLs into the vein continuous. 12/14/20  Yes Blanchie Dessert, MD  levETIRAcetam (KEPPRA) 500 MG tablet Take 1 tablet (500 mg total) by mouth 2 (two) times daily. 09/17/20  Yes Love, Ivan Anchors, PA-C  loratadine (CLARITIN) 10 MG tablet Take 10 mg by mouth at bedtime.   Yes [provider]  losartan (COZAAR) 25 MG tablet Place 1 tablet (25 mg total) into feeding tube daily. Patient taking differently: Take 25 mg by mouth daily. 08/15/20  Yes Pokhrel, Laxman, MD  magnesium gluconate (MAGONATE) 500 MG  tablet Take 500 mg by mouth daily.   Yes [provider]  metoprolol tartrate (LOPRESSOR) 25 MG tablet Take 1 tablet (25 mg total) by mouth 2 (two) times daily. 09/15/20  Yes Love, Ivan Anchors, PA-C  polyethylene glycol (MIRALAX / GLYCOLAX) 17 g packet Take 17 g by mouth daily as needed.   Yes [provider]  polyvinyl alcohol (LIQUIFILM TEARS) 1.4 % ophthalmic solution Place 1 drop into both eyes 2 (two) times daily. 09/15/20  Yes Love, Ivan Anchors, PA-C  potassium chloride (KLOR-CON) 20 MEQ packet Take 40 mEq by mouth 2 (two) times daily. 09/17/20  Yes Love, Ivan Anchors, PA-C  sennosides-docusate sodium (SENOKOT-S) 8.6-50 MG tablet Take 2 tablets by mouth at bedtime.   Yes [provider]  sertraline (ZOLOFT) 25 MG tablet Take 25 mg by mouth at bedtime.   Yes [provider]  tamsulosin (FLOMAX) 0.4 MG CAPS capsule Take 0.4 mg by mouth daily.   Yes [provider]  traZODone (DESYREL) 50 MG tablet Take 50 mg by mouth at bedtime.   Yes [provider]    Allergies    Naproxen and No healthtouch food allergies  Review of Systems   Review of Systems  Unable to perform ROS: Mental status change   Physical Exam Updated Vital Signs BP 123/82   Pulse 88   Temp 99.3 F (37.4 C) (Rectal)   Resp 20   Ht 5\' 7"  (1.702 m)   Wt 75.4 kg   SpO2 96%   BMI 26.03 kg/m   Physical Exam Vitals and nursing note reviewed.  Constitutional:      General: He is not in acute distress.    Appearance: He is well-developed.     Comments: somnolent  HENT:     Head: Normocephalic and atraumatic.     Nose: Nose normal.     Mouth/Throat:     Mouth: Mucous membranes are dry.  Eyes:     Conjunctiva/sclera: Conjunctivae normal.     Pupils: Pupils are equal, round, and reactive to light.  Cardiovascular:     Rate and Rhythm: Regular rhythm. Tachycardia present.     Pulses: Normal pulses.     Heart sounds: No murmur heard. Pulmonary:  Effort: Pulmonary effort  is normal. No respiratory distress.     Breath sounds: No wheezing or rales.     Comments: Decreased breath sounds in the bases Abdominal:     General: There is no distension.     Palpations: Abdomen is soft.     Tenderness: There is no abdominal tenderness. There is no guarding or rebound.  Genitourinary:    Comments: Stool a normal color Musculoskeletal:        General: No tenderness. Normal range of motion.     Cervical back: Normal range of motion and neck supple.  Skin:    General: Skin is warm and dry.     Findings: No erythema or rash.  Neurological:     Comments: Will open eyes to voice but does not follow commands.  Noted to move right upper ext and bilateral lower ext but has not moved his left hand  Psychiatric:     Comments: cooperative    ED Results / Procedures / Treatments   Labs (all labs ordered are listed, but only abnormal results are displayed) Labs Reviewed  CBC WITH DIFFERENTIAL/PLATELET - Abnormal; Notable for the following components:      Result Value   WBC 12.0 (*)    RBC 4.09 (*)    Hemoglobin 11.7 (*)    HCT 36.8 (*)    RDW 17.0 (*)    Platelets 119 (*)    Neutro Abs 9.3 (*)    All other components within normal limits  COMPREHENSIVE METABOLIC PANEL - Abnormal; Notable for the following components:   Sodium 149 (*)    Chloride 119 (*)    Glucose, Bld 204 (*)    BUN 32 (*)    Calcium 8.6 (*)    Albumin 2.3 (*)    AST 63 (*)    ALT 92 (*)    All other components within normal limits  PROTIME-INR - Abnormal; Notable for the following components:   Prothrombin Time 17.9 (*)    INR 1.5 (*)    All other components within normal limits  I-STAT VENOUS BLOOD GAS, ED - Abnormal; Notable for the following components:   pCO2, Ven 40.9 (*)    pO2, Ven 194.0 (*)    Sodium 154 (*)    HCT 55.0 (*)    Hemoglobin 18.7 (*)    All other components within normal limits  TROPONIN I (HIGH SENSITIVITY) - Abnormal; Notable for the following components:    Troponin I (High Sensitivity) 38 (*)    All other components within normal limits  CULTURE, BLOOD (ROUTINE X 2)  CULTURE, BLOOD (ROUTINE X 2)  LACTIC ACID, PLASMA  URINALYSIS, ROUTINE W REFLEX MICROSCOPIC  AMMONIA  TROPONIN I (HIGH SENSITIVITY)    EKG EKG Interpretation  Date/Time:  Monday December 14 2020 12:24:18 EDT Ventricular Rate:  95 PR Interval:  147 QRS Duration: 98 QT Interval:  440 QTC Calculation: 554 R Axis:   88 Text Interpretation: Sinus or ectopic atrial rhythm Borderline right axis deviation Borderline repolarization abnormality new Prolonged QT interval Confirmed by Blanchie Dessert [phone]) on 12/14/2020 12:49:54 PM  Radiology CT Head Wo Contrast  Result Date: 12/14/2020 CLINICAL DATA:  Mental status change, unknown cause EXAM: CT HEAD WITHOUT CONTRAST TECHNIQUE: Contiguous axial images were obtained from the base of the skull through the vertex without intravenous contrast. 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 Old left parietal infarct. Findings are stable since prior study. Small right subdural hematoma  again noted overlying the right frontoparietal lobe, 3 mm compared to 5 mm previously. There is atrophy and chronic small vessel disease changes. Ventriculomegaly likely rib related to ex vacuo dilatation. No acute infarct or acute hemorrhage. Vascular: No hyperdense vessel or unexpected calcification. Skull: Prior right temporoparietal craniotomy. No acute calvarial abnormality. Sinuses/Orbits: No acute findings Other: None IMPRESSION: Small right subdural hematoma, 3 mm compared with 5 mm previously. No new hemorrhage. Old right MCA and left parietal infarcts, stable. Ventriculomegaly related to ex vacuo dilatation. Atrophy, chronic microvascular disease. No acute intracranial abnormality. Electronically Signed   By: Rolm Baptise M.D.   On: 12/14/2020 13:50   DG Chest Port 1 View  Result Date: 12/14/2020 CLINICAL  DATA:  Altered mental status EXAM: PORTABLE CHEST 1 VIEW COMPARISON:  Chest radiograph 08/07/2020 FINDINGS: Median sternotomy wires and mitral and aortic valve prostheses are stable. The cardiomediastinal silhouette is stable. There is calcified atherosclerotic plaque of the aortic arch. Lung volumes are low. There is no focal consolidation or pulmonary edema. There is no pleural effusion or pneumothorax. There is no acute osseous abnormality. IMPRESSION: Low lung volumes. Otherwise, no radiographic evidence of acute cardiopulmonary process. Electronically Signed   By: Valetta Mole M.D.   On: 12/14/2020 13:01    Procedures Procedures   Medications Ordered in ED Medications  lactated ringers bolus 1,000 mL (1,000 mLs Intravenous New Bag/Given 12/14/20 1344)    ED Course  I have reviewed the triage vital signs and the nursing notes.  Pertinent labs & imaging results that were available during my care of the patient were reviewed by me and considered in my medical decision making (see chart for details).    MDM Rules/Calculators/A&P                           Patient here today with altered mental status with known stroke history and subdural hemorrhage resulting in craniotomy and has been in a skilled facility since July presenting today due to altered mental status and lethargy.  Patient was found to be hyponatremic 5 days ago when he was having similar symptoms and improved with IV fluids.  However symptoms are returned today.  Facility denied any fever or infectious symptoms.  On exam patient has a rectal temperature of 99.3.  He is intermittently tachycardic in the low 100s with blood pressure in the low 397Q systolic.  Patient is slightly tachypneic but satting normally on room air.  Concern for recurrent hypernatremia, acute intracranial pathology versus new infectious etiology.  Labs are pending.  Patient given IV fluids.  3:32 PM Patient's wife is now present and reports over the last 3  weeks since he has moved to the skilled portion of the rehab facility he has had progressive lethargy, fatigue, poor oral intake.  On Thursday after she had asked all week they checked his labs and found a sodium of 154.  He did receive 2 L of fluid over a 24-hour period and she felt like he was slightly better but then by yesterday he was again very lethargic and not himself.  She reports he sleeps all the time and as far she knows he has been sleeping at night.  He does take 50 mg of trazodone nightly but has been on that for months.  She is unaware of him having a fever vomiting or diarrhea.  Labs are consistent with persistent hypernatremia and hyperchloremia.  Creatinine mildly elevated at 1.18 from baseline of  1.  No anion gap.  Hemoglobin is stable and white count of 12,000.  VBG without evidence of hypercarbia.  Ammonia and urine are still pending.  Patient given 1 L of fluid and then started on a rate.  Suspect patient will require admission for electrolyte correction due to his ongoing symptoms.  Head CT without any new pathology and improving subdural hemorrhage which has been known.  Chest x-ray without acute findings and EKG without acute findings.  Final Clinical Impression(s) / ED Diagnoses Final diagnoses:  Somnolence  Hypernatremia    Rx / DC Orders ED Discharge Orders          Ordered    lactated ringers infusion  Continuous        12/14/20 1532    In and Out Cath        12/14/20 1532             Blanchie Dessert, MD 12/14/20 1532

## 2020-12-14 NOTE — ED Triage Notes (Signed)
Pt BIB EMS from [place] facility with AMS that is not at his normal baseline. No hx of dementia reported. Pt does have a hx of afib as well as past UTIs EMS states that pt has GCS of 8. Per EMS the facility has been giving a lot of fluids d/t low BP. Per EMS pt. is now hypernatremic.   VSS stable with EMS.

## 2020-12-15 ENCOUNTER — Observation Stay (HOSPITAL_COMMUNITY): Payer: Medicare Other

## 2020-12-15 ENCOUNTER — Other Ambulatory Visit: Payer: Self-pay

## 2020-12-15 DIAGNOSIS — R112 Nausea with vomiting, unspecified: Secondary | ICD-10-CM | POA: Diagnosis not present

## 2020-12-15 DIAGNOSIS — R509 Fever, unspecified: Secondary | ICD-10-CM

## 2020-12-15 DIAGNOSIS — Z952 Presence of prosthetic heart valve: Secondary | ICD-10-CM

## 2020-12-15 DIAGNOSIS — R7881 Bacteremia: Secondary | ICD-10-CM | POA: Diagnosis not present

## 2020-12-15 DIAGNOSIS — I4819 Other persistent atrial fibrillation: Secondary | ICD-10-CM

## 2020-12-15 DIAGNOSIS — I083 Combined rheumatic disorders of mitral, aortic and tricuspid valves: Secondary | ICD-10-CM | POA: Diagnosis not present

## 2020-12-15 DIAGNOSIS — A419 Sepsis, unspecified organism: Secondary | ICD-10-CM | POA: Diagnosis not present

## 2020-12-15 DIAGNOSIS — Z4682 Encounter for fitting and adjustment of non-vascular catheter: Secondary | ICD-10-CM | POA: Diagnosis not present

## 2020-12-15 DIAGNOSIS — T826XXA Infection and inflammatory reaction due to cardiac valve prosthesis, initial encounter: Secondary | ICD-10-CM | POA: Diagnosis not present

## 2020-12-15 DIAGNOSIS — I63442 Cerebral infarction due to embolism of left cerebellar artery: Secondary | ICD-10-CM | POA: Diagnosis not present

## 2020-12-15 DIAGNOSIS — G459 Transient cerebral ischemic attack, unspecified: Secondary | ICD-10-CM | POA: Diagnosis not present

## 2020-12-15 DIAGNOSIS — I69320 Aphasia following cerebral infarction: Secondary | ICD-10-CM | POA: Diagnosis not present

## 2020-12-15 DIAGNOSIS — G9341 Metabolic encephalopathy: Secondary | ICD-10-CM | POA: Diagnosis not present

## 2020-12-15 DIAGNOSIS — I76 Septic arterial embolism: Secondary | ICD-10-CM | POA: Diagnosis not present

## 2020-12-15 DIAGNOSIS — R110 Nausea: Secondary | ICD-10-CM | POA: Diagnosis not present

## 2020-12-15 DIAGNOSIS — R7303 Prediabetes: Secondary | ICD-10-CM | POA: Diagnosis not present

## 2020-12-15 DIAGNOSIS — M19031 Primary osteoarthritis, right wrist: Secondary | ICD-10-CM | POA: Diagnosis not present

## 2020-12-15 DIAGNOSIS — J9811 Atelectasis: Secondary | ICD-10-CM | POA: Diagnosis not present

## 2020-12-15 DIAGNOSIS — R404 Transient alteration of awareness: Secondary | ICD-10-CM | POA: Diagnosis not present

## 2020-12-15 DIAGNOSIS — Y712 Prosthetic and other implants, materials and accessory cardiovascular devices associated with adverse incidents: Secondary | ICD-10-CM | POA: Diagnosis present

## 2020-12-15 DIAGNOSIS — I69354 Hemiplegia and hemiparesis following cerebral infarction affecting left non-dominant side: Secondary | ICD-10-CM | POA: Diagnosis not present

## 2020-12-15 DIAGNOSIS — I371 Nonrheumatic pulmonary valve insufficiency: Secondary | ICD-10-CM | POA: Diagnosis present

## 2020-12-15 DIAGNOSIS — E871 Hypo-osmolality and hyponatremia: Secondary | ICD-10-CM | POA: Diagnosis present

## 2020-12-15 DIAGNOSIS — E870 Hyperosmolality and hypernatremia: Secondary | ICD-10-CM | POA: Diagnosis present

## 2020-12-15 DIAGNOSIS — I4891 Unspecified atrial fibrillation: Secondary | ICD-10-CM | POA: Diagnosis not present

## 2020-12-15 DIAGNOSIS — I482 Chronic atrial fibrillation, unspecified: Secondary | ICD-10-CM | POA: Diagnosis not present

## 2020-12-15 DIAGNOSIS — R531 Weakness: Secondary | ICD-10-CM | POA: Diagnosis not present

## 2020-12-15 DIAGNOSIS — N390 Urinary tract infection, site not specified: Secondary | ICD-10-CM | POA: Diagnosis present

## 2020-12-15 DIAGNOSIS — G40909 Epilepsy, unspecified, not intractable, without status epilepticus: Secondary | ICD-10-CM | POA: Diagnosis present

## 2020-12-15 DIAGNOSIS — I712 Thoracic aortic aneurysm, without rupture, unspecified: Secondary | ICD-10-CM | POA: Diagnosis not present

## 2020-12-15 DIAGNOSIS — I5032 Chronic diastolic (congestive) heart failure: Secondary | ICD-10-CM | POA: Diagnosis present

## 2020-12-15 DIAGNOSIS — R0602 Shortness of breath: Secondary | ICD-10-CM | POA: Diagnosis not present

## 2020-12-15 DIAGNOSIS — D638 Anemia in other chronic diseases classified elsewhere: Secondary | ICD-10-CM | POA: Diagnosis present

## 2020-12-15 DIAGNOSIS — Z7401 Bed confinement status: Secondary | ICD-10-CM | POA: Diagnosis not present

## 2020-12-15 DIAGNOSIS — Z66 Do not resuscitate: Secondary | ICD-10-CM | POA: Diagnosis not present

## 2020-12-15 DIAGNOSIS — Z87898 Personal history of other specified conditions: Secondary | ICD-10-CM | POA: Diagnosis not present

## 2020-12-15 DIAGNOSIS — R400 Somnolence: Secondary | ICD-10-CM | POA: Diagnosis not present

## 2020-12-15 DIAGNOSIS — R0902 Hypoxemia: Secondary | ICD-10-CM | POA: Diagnosis not present

## 2020-12-15 DIAGNOSIS — S065XAA Traumatic subdural hemorrhage with loss of consciousness status unknown, initial encounter: Secondary | ICD-10-CM | POA: Diagnosis not present

## 2020-12-15 DIAGNOSIS — I509 Heart failure, unspecified: Secondary | ICD-10-CM | POA: Diagnosis not present

## 2020-12-15 DIAGNOSIS — Z20822 Contact with and (suspected) exposure to covid-19: Secondary | ICD-10-CM | POA: Diagnosis not present

## 2020-12-15 DIAGNOSIS — I110 Hypertensive heart disease with heart failure: Secondary | ICD-10-CM | POA: Diagnosis present

## 2020-12-15 DIAGNOSIS — I878 Other specified disorders of veins: Secondary | ICD-10-CM | POA: Diagnosis not present

## 2020-12-15 DIAGNOSIS — I517 Cardiomegaly: Secondary | ICD-10-CM | POA: Diagnosis not present

## 2020-12-15 DIAGNOSIS — E43 Unspecified severe protein-calorie malnutrition: Secondary | ICD-10-CM | POA: Diagnosis not present

## 2020-12-15 DIAGNOSIS — Z9889 Other specified postprocedural states: Secondary | ICD-10-CM | POA: Diagnosis not present

## 2020-12-15 DIAGNOSIS — R4182 Altered mental status, unspecified: Secondary | ICD-10-CM | POA: Diagnosis not present

## 2020-12-15 DIAGNOSIS — I38 Endocarditis, valve unspecified: Secondary | ICD-10-CM | POA: Diagnosis not present

## 2020-12-15 DIAGNOSIS — I330 Acute and subacute infective endocarditis: Secondary | ICD-10-CM | POA: Diagnosis not present

## 2020-12-15 DIAGNOSIS — I69391 Dysphagia following cerebral infarction: Secondary | ICD-10-CM | POA: Diagnosis not present

## 2020-12-15 DIAGNOSIS — Z87820 Personal history of traumatic brain injury: Secondary | ICD-10-CM | POA: Diagnosis not present

## 2020-12-15 DIAGNOSIS — R569 Unspecified convulsions: Secondary | ICD-10-CM | POA: Diagnosis not present

## 2020-12-15 DIAGNOSIS — K567 Ileus, unspecified: Secondary | ICD-10-CM | POA: Diagnosis not present

## 2020-12-15 DIAGNOSIS — Z8673 Personal history of transient ischemic attack (TIA), and cerebral infarction without residual deficits: Secondary | ICD-10-CM | POA: Diagnosis not present

## 2020-12-15 DIAGNOSIS — T826XXD Infection and inflammatory reaction due to cardiac valve prosthesis, subsequent encounter: Secondary | ICD-10-CM | POA: Diagnosis not present

## 2020-12-15 DIAGNOSIS — I071 Rheumatic tricuspid insufficiency: Secondary | ICD-10-CM | POA: Diagnosis present

## 2020-12-15 DIAGNOSIS — I639 Cerebral infarction, unspecified: Secondary | ICD-10-CM | POA: Diagnosis not present

## 2020-12-15 DIAGNOSIS — I480 Paroxysmal atrial fibrillation: Secondary | ICD-10-CM | POA: Diagnosis not present

## 2020-12-15 DIAGNOSIS — A4181 Sepsis due to Enterococcus: Secondary | ICD-10-CM | POA: Diagnosis not present

## 2020-12-15 LAB — COMPREHENSIVE METABOLIC PANEL
ALT: 75 U/L — ABNORMAL HIGH (ref 0–44)
AST: 48 U/L — ABNORMAL HIGH (ref 15–41)
Albumin: 2 g/dL — ABNORMAL LOW (ref 3.5–5.0)
Alkaline Phosphatase: 65 U/L (ref 38–126)
Anion gap: 6 (ref 5–15)
BUN: 25 mg/dL — ABNORMAL HIGH (ref 8–23)
CO2: 23 mmol/L (ref 22–32)
Calcium: 8.1 mg/dL — ABNORMAL LOW (ref 8.9–10.3)
Chloride: 119 mmol/L — ABNORMAL HIGH (ref 98–111)
Creatinine, Ser: 0.91 mg/dL (ref 0.61–1.24)
GFR, Estimated: >60 mL/min (ref >60)
Glucose, Bld: 144 mg/dL — ABNORMAL HIGH (ref 70–99)
Potassium: 3.8 mmol/L (ref 3.5–5.1)
Sodium: 148 mmol/L — ABNORMAL HIGH (ref 135–145)
Total Bilirubin: 0.7 mg/dL (ref 0.3–1.2)
Total Protein: 5.8 g/dL — ABNORMAL LOW (ref 6.5–8.1)

## 2020-12-15 LAB — ECHOCARDIOGRAM COMPLETE
AR max vel: 5.29 cm2
AV Area VTI: 1.56 cm2
AV Area mean vel: 1.55 cm2
AV Mean grad: 16 mmHg
AV Peak grad: 24.4 mmHg
Ao pk vel: 2.47 m/s
Area-P 1/2: 3.16 cm2
Calc EF: 72.1 %
Height: 67 in
MV VTI: 1.39 cm2
S' Lateral: 3.5 cm
Single Plane A2C EF: 77.8 %
Single Plane A4C EF: 68.1 %
Weight: 2659.21 oz

## 2020-12-15 LAB — BLOOD CULTURE ID PANEL (REFLEXED) - BCID2

## 2020-12-15 LAB — CBC
HCT: 31.6 % — ABNORMAL LOW (ref 39.0–52.0)
Hemoglobin: 9.8 g/dL — ABNORMAL LOW (ref 13.0–17.0)
MCH: 28.7 pg (ref 26.0–34.0)
MCHC: 31 g/dL (ref 30.0–36.0)
MCV: 92.4 fL (ref 80.0–100.0)
Platelets: 108 10*3/uL — ABNORMAL LOW (ref 150–400)
RBC: 3.42 MIL/uL — ABNORMAL LOW (ref 4.22–5.81)
RDW: 17.1 % — ABNORMAL HIGH (ref 11.5–15.5)
WBC: 11.1 10*3/uL — ABNORMAL HIGH (ref 4.0–10.5)
nRBC: 0 % (ref 0.0–0.2)

## 2020-12-15 LAB — PROTIME-INR
INR: 1.5 — ABNORMAL HIGH (ref 0.8–1.2)
Prothrombin Time: 17.8 seconds — ABNORMAL HIGH (ref 11.4–15.2)

## 2020-12-15 LAB — PROCALCITONIN: Procalcitonin: 0.26 ng/mL

## 2020-12-15 LAB — MAGNESIUM: Magnesium: 2.2 mg/dL (ref 1.7–2.4)

## 2020-12-15 MED ORDER — PERFLUTREN LIPID MICROSPHERE
1.0000 mL | INTRAVENOUS | Status: AC | PRN
  Administered 2020-12-15: 2 mL via INTRAVENOUS
  Filled 2020-12-15: qty 10

## 2020-12-15 MED ORDER — SODIUM CHLORIDE 0.9 % IV SOLN
2.0000 g | Freq: Four times a day (QID) | INTRAVENOUS | Status: DC
  Administered 2020-12-15 – 2020-12-25 (×41): 2 g via INTRAVENOUS
  Filled 2020-12-15 (×48): qty 2000

## 2020-12-15 MED ORDER — POTASSIUM CHLORIDE CRYS ER 20 MEQ PO TBCR
20.0000 meq | EXTENDED_RELEASE_TABLET | Freq: Once | ORAL | Status: AC
  Administered 2020-12-15: 20 meq via ORAL
  Filled 2020-12-15: qty 1

## 2020-12-15 MED ORDER — ENOXAPARIN SODIUM 80 MG/0.8ML IJ SOSY: 1.0000 mg/kg | PREFILLED_SYRINGE | Freq: Two times a day (BID) | INTRAMUSCULAR | Status: DC

## 2020-12-15 MED ORDER — LEVETIRACETAM IN NACL 500 MG/100ML IV SOLN
500.0000 mg | Freq: Two times a day (BID) | INTRAVENOUS | Status: DC
  Administered 2020-12-15 – 2020-12-20 (×11): 500 mg via INTRAVENOUS
  Filled 2020-12-15 (×12): qty 100

## 2020-12-15 MED ORDER — APIXABAN 5 MG PO TABS
5.0000 mg | ORAL_TABLET | Freq: Two times a day (BID) | ORAL | Status: DC
  Administered 2020-12-15 – 2020-12-20 (×10): 5 mg via ORAL
  Filled 2020-12-15 (×9): qty 1

## 2020-12-15 MED ORDER — GENTAMICIN IN SALINE 1.6-0.9 MG/ML-% IV SOLN
80.0000 mg | Freq: Once | INTRAVENOUS | Status: AC
  Administered 2020-12-15: 80 mg via INTRAVENOUS
  Filled 2020-12-15: qty 50

## 2020-12-15 NOTE — Consult Note (Addendum)
Cardiology Consultation:   Patient ID: [name] [name] MRN: [id number]; DOB: November 03, 1940  Admit date: 12/14/2020 Date of Consult: 12/15/2020  PCP:  [name], MD   [facility] Providers Cardiologist:  Loralie Champagne, MD     Patient Profile:   [name] [name] is a 80 y.o. 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 Gale Journey.  History of Present Illness:   [name] [name] has a history of rheumatic fever and is status post bioprosthetic AVR and MVR at [facility] clinic.  This occurred in 2006.  Preop heart cath at that time showed no significant coronary artery disease.  He had a Maze procedure.  He was diagnosed with atypical atrial flutter in October 2013.  After MAZE, he continued to have bouts of atrial flutter with hx of DCCV and also spontaneous conversions back to sinus.  He had a recurrence of atypical atrial flutter in June 2015 in the setting of CVA confirmed by MRI.  He was restarted on Coumadin.  Ranolazine was added to his dronedarone.  He ultimately had an atrial flutter ablation at [facility] clinic in November 2015.  Stable bioprosthetic mitral and aortic valves on echo in February 2016.  CTA chest with a stable 5 cm ascending aortic aneurysm.  Unfortunately, he has continued to have bouts of atypical atrial flutter.  He underwent repeat A. fib/flutter ablation in July 2020 with [facility] clinic.  CTA chest at that time showed 5.1 cm ascending thoracic aorta.  He was last seen by Dr. [name] in January 2022 and was doing well at that time.  He is anticoagulated with Eliquis and maintained on Tikosyn and metoprolol.   He was hospitalized May 23 through Jul 31, 2020 with right subdural hematoma status post right craniotomy.  He sustained a  fall at church with a head injury and loss consciousness.  He was brought to the ER a day later with altered mental status.  He was initially taken to the ICU for comfort care measures but had a dramatic improvement the following day and was taken to the OR for craniotomy.  Oral anticoagulation was interrupted due to his bleed.  He was discharged to rehab where he was making slow recovery.  On 08/07/2020 he had reduced LOC with left-sided weakness.  Code stroke was activated.  CTA showed occluded right ICA, felt due to possibly A. fib after interruption of [place] for recent SDH.  MRI confirmed MCA stroke.  He underwent revascularization of right carotid artery occlusion.  Echocardiogram 08/07/2020 revealed continued preserved EF, severely dilated left atrium, no MR on bioprosthetic valve, and trace AI.  Aortic root was dilated to 50 mm. He was discharged back to rehab on 08/15/2020.  He was discharged from rehab on 09/17/2020.  He presented back to [facility], ER yesterday with altered mental status felt due to dehydration, UTI, and hypernatremia.  He has had poor p.o. intake and significant weight loss over the last few weeks.  Sodium was 154.  He was started on IV fluids and antibiotics.  Keppra was continued, no seizure activity.  Blood cultures positive for Enterococcus facialis.  Infectious disease has been consulted.  Echocardiogram today showed preserved EF, aortic valve peak gradient was 24.4 mmHg, mean gradient was 16.0 mmHg, and VTI  was 1.56 cm.  Mitral valve with peak gradient of 17.1 mmHg and mean gradient of 10.0 mmHg.  Cardiology was consulted for suspected endocarditis given history of bioprosthetic valves. Mentation has improved slightly with improvement in hypernatremia.   During my exam, patient did not open eyes to sternal rub, did move both feet. Per RN, this has been his baseline for the last 2-3 weeks. Wife has reported good, but reduced PO intake. Pt likely aspirating. Will be conservative with  PO medications.    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    Other and unspecified hyperlipidemia    Pre-diabetes    Previous back surgery 1978, jan 2007   Psychosexual dysfunction with inhibited sexual excitement    SOB (shortness of breath)    "with heavy exercise"   Stroke ([place]) 08/2013   "I WAS IN AFIB AND THREW A CLOT, THE EFFECTS WERE TRANSITORY AND DIDNT LAST BUT FOR 30 MINUTES"    Thoracic aortic aneurysm     Past Surgical History:  Procedure Laterality Date   AORTIC AND MITRAL VALVE REPLACEMENT     09/2004   CARDIOVERSION     3 times from 2004-2006   CARDIOVERSION N/A 09/26/2013   Procedure: CARDIOVERSION;  Surgeon: Larey Dresser, MD;  Location: [employer];  Service: Cardiovascular;  Laterality: N/A;   CARDIOVERSION N/A 06/19/2014   Procedure: CARDIOVERSION;  Surgeon: Jerline Pain, MD;  Location: [place];  Service: Cardiovascular;  Laterality: N/A;   CARDIOVERSION N/A 04/24/2017   Procedure: CARDIOVERSION;  Surgeon: Larey Dresser, MD;  Location: [place];  Service: Cardiovascular;  Laterality: N/A;   CARDIOVERSION N/A 06/08/2017   Procedure: CARDIOVERSION;  Surgeon: Pixie Casino, MD;  Location: [facility] ENDOSCOPY;  Service: Cardiovascular;  Laterality: N/A;   CARDIOVERSION N/A 08/24/2017   Procedure: CARDIOVERSION;  Surgeon: Skeet Latch, MD;  Location: [facility] ENDOSCOPY;  Service: Cardiovascular;  Laterality: N/A;   CARDIOVERSION N/A 02/14/2018   Procedure: CARDIOVERSION;   Surgeon: Larey Dresser, MD;  Location: [facility] ENDOSCOPY;  Service: Cardiovascular;  Laterality: N/A;   COLONOSCOPY     CRANIOTOMY N/A 07/28/2020   Procedure: CRANIOTOMY FOR EVACUATION OF SUBDURAL HEMATOMA;  Surgeon: Eustace Bostelman, MD;  Location: [place];  Service: Neurosurgery;  Laterality: N/A;   IR ANGIO VERTEBRAL SEL SUBCLAVIAN INNOMINATE UNI R MOD SED  08/07/2020   IR CT HEAD LTD  08/07/2020   IR PERCUTANEOUS ART THROMBECTOMY/INFUSION INTRACRANIAL INC DIAG ANGIO  08/07/2020   laminectomies     10/1975 and in 03/2005   RADIOLOGY WITH ANESTHESIA N/A 08/07/2020   Procedure: IR WITH ANESTHESIA;  Surgeon: Luanne Bras, MD;  Location: [place];  Service: Radiology;  Laterality: N/A;   TONSILLECTOMY     1950   TOTAL HIP ARTHROPLASTY Right 04/03/2018   Procedure: TOTAL HIP ARTHROPLASTY ANTERIOR APPROACH;  Surgeon: Paralee Cancel, MD;  Location: [facility];  Service: Orthopedics;  Laterality: Right;  70 mins     Home Medications:  Prior to Admission medications   Medication Sig Start Date End Date Taking? Authorizing Provider  apixaban (ELIQUIS) 5 MG TABS tablet Take 1 tablet (5 mg total) by mouth 2 (two) times daily. 09/16/20  Yes Love, Ivan Anchors, PA-C  atorvastatin (LIPITOR) 80 MG tablet Take 1 tablet (80 mg total) by mouth daily. 09/15/20  Yes Love, Ivan Anchors, PA-C  clobetasol cream (TEMOVATE) 0.94 % Apply 1 application topically daily as needed (for skin irritation).  05/20/14  Yes [provider]  dofetilide (TIKOSYN) 500 MCG capsule Take 1 capsule (500 mcg total) by mouth 2 (two) times daily. 03/26/20  Yes Larey Dresser, MD  famotidine (PEPCID) 20 MG tablet Take 20 mg by mouth at bedtime as needed.   Yes [provider]  feeding supplement, GLUCERNA SHAKE, (GLUCERNA SHAKE) LIQD Take 237 mLs by mouth 2 (two) times daily between meals. For weight loss   Yes [provider]  finasteride (PROSCAR) 5 MG tablet Take 5 mg by mouth daily.   Yes [provider]  hydrALAZINE  (APRESOLINE) 10 MG tablet Take 10 mg by mouth every 6 (six) hours as needed (For b/p greater than 150/90).   Yes [provider]  hydrocortisone cream 1 % Apply topically 2 (two) times daily as needed for itching (rash). 09/08/20  Yes Love, Ivan Anchors, PA-C  lactated ringers infusion Inject 125 mLs into the vein continuous. 12/14/20  Yes Blanchie Dessert, MD  levETIRAcetam (KEPPRA) 500 MG tablet Take 1 tablet (500 mg total) by mouth 2 (two) times daily. 09/17/20  Yes Love, Ivan Anchors, PA-C  loratadine (CLARITIN) 10 MG tablet Take 10 mg by mouth at bedtime.   Yes [provider]  losartan (COZAAR) 25 MG tablet Place 1 tablet (25 mg total) into feeding tube daily. Patient taking differently: Take 25 mg by mouth daily. 08/15/20  Yes Pokhrel, Laxman, MD  magnesium gluconate (MAGONATE) 500 MG tablet Take 500 mg by mouth daily.   Yes [provider]  metoprolol tartrate (LOPRESSOR) 25 MG tablet Take 1 tablet (25 mg total) by mouth 2 (two) times daily. 09/15/20  Yes Love, Ivan Anchors, PA-C  polyethylene glycol (MIRALAX / GLYCOLAX) 17 g packet Take 17 g by mouth daily as needed.   Yes [provider]  polyvinyl alcohol (LIQUIFILM TEARS) 1.4 % ophthalmic solution Place 1 drop into both eyes 2 (two) times daily. 09/15/20  Yes Love, Ivan Anchors, PA-C  potassium chloride (KLOR-CON) 20 MEQ packet Take 40 mEq by mouth 2 (two) times daily. 09/17/20  Yes Love, Ivan Anchors, PA-C  sennosides-docusate sodium (SENOKOT-S) 8.6-50 MG tablet Take 2 tablets by mouth at bedtime.   Yes [provider]  sertraline (ZOLOFT) 25 MG tablet Take 25 mg by mouth at bedtime.   Yes [provider]  tamsulosin (FLOMAX) 0.4 MG CAPS capsule Take 0.4 mg by mouth daily.   Yes [provider]  traZODone (DESYREL) 50 MG tablet Take 50 mg by mouth at bedtime.   Yes [provider]    Inpatient Medications: Scheduled Meds:  apixaban  5 mg Oral BID   dofetilide  500 mcg Oral BID   feeding  supplement (GLUCERNA SHAKE)  237 mL Oral BID BM   finasteride  5 mg Oral Daily   potassium chloride  20 mEq Oral Once   sodium chloride flush  3 mL Intravenous Q12H   tamsulosin  0.4 mg Oral Daily  Continuous Infusions:  sodium chloride 75 mL/hr at 12/15/20 0732   sodium chloride     ampicillin (OMNIPEN) IV Stopped (12/15/20 1440)   levETIRAcetam Stopped (12/15/20 1212)   PRN Meds: sodium chloride, sodium chloride flush  Allergies:    Allergies  Allergen Reactions   Naproxen Hives   No [employer] Food Allergies Other (See Comments)    Scallops - distress, nausea and vomitting    Social History:   Social History   Socioeconomic History   Marital status: Married    Spouse name: Herbert Pun   Number of children: 2   Years of education: BS   Highest education level: Not on file  Occupational History   Occupation: retired  Tobacco Use   Smoking status: Never   Smokeless tobacco: Never  Vaping Use   Vaping Use: Never used  Substance and Sexual Activity   Alcohol use: Never    Comment: wine occasionally   Drug use: Never   Sexual activity: Yes    Partners: Female  Other Topics Concern   Not on file  Social History Narrative   10/27/20 residing at [facility] care center since 09/17/20    [employer] - BS, JD. married - '67. 2 sons - '74, '77  one son is a Nurse, learning disability for [place] [employer]; 4 grandchildren. 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 marriage in good health. End-of-life: discussed issues and provided packet with the MOST form and out of facility order.   Social Determinants of Health   Financial Resource Strain: Not on file  Food Insecurity: Not on file  Transportation Needs: Not on file  Physical Activity: Not on file  Stress: Not on file  Social Connections: Not on file  Intimate Partner Violence: Not on file    Family History:    Family History  Problem Relation Age of  Onset   Macular degeneration Mother        macular degeneration   Cancer Father        intestinal/GI   Diabetes Paternal Aunt    Heart attack Maternal Grandfather    Diabetes Brother      ROS:  Please see the history of present illness.   All other ROS reviewed and negative.     Physical Exam/Data:   Vitals:   12/15/20 1200 12/15/20 1300 12/15/20 1412 12/15/20 1556  BP: 120/67 111/82 90/60 93/68   Pulse: 73 100 94 81  Resp: (!) 33 (!) 26 (!) 25 15  Temp:   99.6 F (37.6 C) 98.6 F (37 C)  TempSrc:   Axillary Axillary  SpO2: 97% 92% 94% 97%  Weight:      Height:        Intake/Output Summary (Last 24 hours) at 12/15/2020 1703 Last data filed at 12/15/2020 0732 Gross per 24 hour  Intake 1141.19 ml  Output --  Net 1141.19 ml   Last 3 Weights 12/14/2020 12/14/2020 12/10/2020  Weight (lbs) 166 lb 3.2 oz 166 lb 3.2 oz 166 lb  Weight (kg) 75.388 kg 75.388 kg 75.297 kg     Body mass index is 26.03 kg/m.  General:  minimally unresponsive to sternal rub Neck: no JVD Vascular: No carotid bruits; Distal pulses 2+ bilaterally Cardiac:  normal S1, S2; RRR; no murmur  Lungs:  clear to auscultation bilaterally, no wheezing, rhonchi or rales  Abd: soft, nontender, no hepatomegaly  Ext: no edema Musculoskeletal:  No deformities, BUE and BLE strength normal  and equal Skin: warm and dry  Neuro:  CNs 2-12 intact, no focal abnormalities noted Psych:  Normal affect   EKG:  The EKG was personally reviewed and demonstrates:  sinus rhythm with HR 90 with first degree heart block Telemetry:  Telemetry was personally reviewed and demonstrates:  generally sinus with first degree heart block, but episodes suspect for atrial flutter   Relevant CV Studies:  Echo 12/15/20:  1. The mitral valve has been replaced with an unknown bioprosthesis. No  evidence of mitral valve regurgitation. The mean mitral valve gradient is  10.0 mmHg with average heart rate of 101 bpm. PHT 70 ms. Suboptimal  LVOT  spectral Doppler- VTI deferred.  Date of Procedure Date: 10/18/2004.   2. The aortic valve has been repaired/replaced. Aortic valve  regurgitation is not visualized. 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 Procedure Date:  10/18/2004. Aortic valve mean gradient measures 16.0 mmHg.   Aortic valve acceleration time measures 74 msec.   3. Left ventricular ejection fraction, by estimation, is 65 to 70%. The  left ventricle has normal function. The left ventricle has no regional  wall motion abnormalities. There is mild concentric left ventricular  hypertrophy. Left ventricular diastolic  parameters are indeterminate.   4. Aortic dilatation noted. There is moderate dilatation of the aortic  root, measuring 48 mm. 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   5. Right ventricular systolic function is normal. The right ventricular  size is normal.   Laboratory Data:  High Sensitivity Troponin:   Recent Labs  Lab 12/14/20 1245 12/14/20 1639 12/14/20 2056  TROPONINIHS 38* 45* 56*     Chemistry Recent Labs  Lab 12/10/20 0000 12/11/20 0000 12/13/20 0000 12/14/20 1245 12/14/20 1504 12/14/20 2056 12/15/20 0508  NA  --    < > 150* 149* 154*  --  148*  K 4.9   < > 3.8 4.1 4.3  --  3.8  CL 119*   < > 120* 119*  --   --  119*  CO2 21   < > 22 22  --   --  23  GLUCOSE  --   --   --  204*  --   --  144*  BUN 41*   < > 29* 32*  --   --  25*  CREATININE 1.2   < > 1.0 1.18  --   --  0.91  CALCIUM 8.9   < > 8.3* 8.6*  --   --  8.1*  MG 2.6  --   --   --   --  2.3 2.2  GFRNONAA  --   --   --  >60  --   --  >60  ANIONGAP  --   --   --  8  --   --  6   < > = values in this interval not displayed.    Recent Labs  Lab 12/12/20 0000 12/14/20 1245 12/15/20 0508  PROT  --  6.8 5.8*  ALBUMIN 2.9* 2.3* 2.0*  AST 47* 63* 48*  ALT 78* 92* 75*  ALKPHOS 82 68 65  BILITOT  --  0.5 0.7   Lipids No results for  input(s): CHOL, TRIG, HDL, LABVLDL, LDLCALC, CHOLHDL in the last 168 hours.  Hematology Recent Labs  Lab 12/10/20 0000 12/14/20 1245 12/14/20 1504 12/15/20 0508  WBC 11.6 12.0*  --  11.1*  RBC 4.6 4.09*  --  3.42*  HGB 12.9* 11.7* 18.7* 9.8*  HCT 40* 36.8* 55.0* 31.6*  MCV  --  90.0  --  92.4  MCH  --  28.6  --  28.7  MCHC  --  31.8  --  31.0  RDW  --  17.0*  --  17.1*  PLT 159 119*  --  108*   Thyroid  Recent Labs  Lab 12/14/20 2056  TSH 1.567    BNPNo results for input(s): BNP, PROBNP in the last 168 hours.  DDimer No results for input(s): DDIMER in the last 168 hours.   Radiology/Studies:  CT Head Wo Contrast  Result Date: 12/14/2020 CLINICAL DATA:  Mental status change, unknown cause EXAM: CT HEAD WITHOUT CONTRAST TECHNIQUE: Contiguous axial images were obtained from the base of the skull through the vertex without intravenous contrast. 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 Old left parietal infarct. Findings are stable since prior study. Small right subdural hematoma again noted overlying the right frontoparietal lobe, 3 mm compared to 5 mm previously. There is atrophy and chronic small vessel disease changes. Ventriculomegaly likely rib related to ex vacuo dilatation. No acute infarct or acute hemorrhage. Vascular: No hyperdense vessel or unexpected calcification. Skull: Prior right temporoparietal craniotomy. No acute calvarial abnormality. Sinuses/Orbits: No acute findings Other: None IMPRESSION: Small right subdural hematoma, 3 mm compared with 5 mm previously. No new hemorrhage. Old right MCA and left parietal infarcts, stable. Ventriculomegaly related to ex vacuo dilatation. Atrophy, chronic microvascular disease. No acute intracranial abnormality. Electronically Signed   By: Rolm Baptise M.D.   On: 12/14/2020 13:50   DG Chest Port 1 View  Result Date: 12/14/2020 CLINICAL DATA:  Altered mental status EXAM: PORTABLE CHEST  1 VIEW COMPARISON:  Chest radiograph 08/07/2020 FINDINGS: Median sternotomy wires and mitral and aortic valve prostheses are stable. The cardiomediastinal silhouette is stable. There is calcified atherosclerotic plaque of the aortic arch. Lung volumes are low. There is no focal consolidation or pulmonary edema. There is no pleural effusion or pneumothorax. There is no acute osseous abnormality. IMPRESSION: Low lung volumes. Otherwise, no radiographic evidence of acute cardiopulmonary process. Electronically Signed   By: Valetta Mole M.D.   On: 12/14/2020 13:01   ECHOCARDIOGRAM COMPLETE  Result Date: 12/15/2020    ECHOCARDIOGRAM REPORT   Patient Name:   [name] [name] Date of Exam: 12/15/2020 Medical Rec #:  [id number]     Height:       67.0 in Accession #:    [id number]    Weight:       166.2 lb Date of Birth:  15-Dec-1940     BSA:          1.869 m Patient Age:    80 years      BP:           120/67 mmHg Patient Gender: M             HR:           98 bpm. Exam Location:  Inpatient Procedure: 2D Echo, Cardiac Doppler, Color Doppler and Intracardiac            Opacification Agent Indications:    Fever R50.9  History:        Patient has prior history of Echocardiogram examinations, most                 recent 08/07/2020. Stroke; Arrythmias:Atrial Fibrillation. Past  history of rheumatic fever, acute rheumatic pericarditis.                 Thoracic aortic aneurysm.                 Aortic Valve: 27 mm Carpentier-Edwards prosthetic valve is                 present in the aortic position. Procedure Date: 10/18/2004.  Sonographer:    Darlina Sicilian RDCS Sonographer#2:  Darlina Sicilian RDCS Referring Phys: [id number] [employer]  1. The mitral valve has been replaced with an unknown bioprosthesis. No evidence of mitral valve regurgitation. The mean mitral valve gradient is 10.0 mmHg with average heart rate of 101 bpm. PHT 70 ms. Suboptimal LVOT spectral Doppler- VTI deferred. Date of Procedure  Date: 10/18/2004.  2. The aortic valve has been repaired/replaced. Aortic valve regurgitation is not visualized. 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 Procedure Date: 10/18/2004. Aortic valve mean gradient measures 16.0 mmHg.  Aortic valve acceleration time measures 74 msec.  3. Left ventricular ejection fraction, by estimation, is 65 to 70%. The left ventricle has normal function. The left ventricle has no regional wall motion abnormalities. There is mild concentric left ventricular hypertrophy. Left ventricular diastolic parameters are indeterminate.  4. Aortic dilatation noted. 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  5. Right ventricular systolic function is normal. The right ventricular size is normal. Comparison(s): A prior study was performed on 08/07/2020. Prior images reviewed side by side. Stable aortic root and ascending aorta size. Increased mitral valve gradients, but at higher hear rates from prior. FINDINGS  Left Ventricle: Left ventricular ejection fraction, by estimation, is 65 to 70%. The left ventricle has normal function. The left ventricle has no regional wall motion abnormalities. Definity contrast agent was given IV to delineate the left ventricular  endocardial borders. The left ventricular internal cavity size was normal in size. There is mild concentric left ventricular hypertrophy. Left ventricular diastolic parameters are indeterminate. Right Ventricle: The right ventricular size is normal. No increase in right ventricular wall thickness. Right ventricular systolic function is normal. Left Atrium: Left atrial size was normal in size. Right Atrium: Right atrial size was normal in size. Pericardium: There is no evidence of pericardial effusion. Mitral Valve: The mitral valve has been repaired/replaced. No evidence of mitral valve regurgitation. There  is a 29 mm Carpentier-Edwards prosthetic present in the mitral position. MV peak gradient, 17.1 mmHg. The mean mitral valve gradient is 10.0 mmHg with average heart rate of 101 bpm. Tricuspid Valve: The tricuspid valve is grossly normal. Tricuspid valve regurgitation is mild . No evidence of tricuspid stenosis. Aortic Valve: The aortic valve has been repaired/replaced. Aortic valve regurgitation is not visualized. Aortic valve mean gradient measures 16.0 mmHg. Aortic valve peak gradient measures 24.4 mmHg. Aortic valve area, by VTI measures 1.56 cm. There is a  27 mm Carpentier-Edwards prosthetic valve present in the aortic position. Procedure Date: 10/18/2004. Pulmonic Valve: The pulmonic valve was normal in structure. Pulmonic valve regurgitation is mild. No evidence of pulmonic stenosis. Aorta: The ascending aorta was not well visualized and aortic dilatation noted. 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 IAS/Shunts: The atrial septum is grossly normal.  LEFT VENTRICLE PLAX 2D LVIDd:  4.80 cm LVIDs:         3.50 cm LV PW:         1.30 cm LV IVS:        1.30 cm LVOT diam:     3.25 cm LV SV:         62 LV SV Index:   33 LVOT Area:     8.30 cm  LV Volumes (MOD) LV vol d, MOD A2C: 81.2 ml LV vol d, MOD A4C: 94.4 ml LV vol s, MOD A2C: 18.0 ml LV vol s, MOD A4C: 30.1 ml LV SV MOD A2C:     63.2 ml LV SV MOD A4C:     94.4 ml LV SV MOD BP:      63.9 ml RIGHT VENTRICLE RV S prime:     10.40 cm/s LEFT ATRIUM             Index        RIGHT ATRIUM          Index LA diam:        4.50 cm 2.41 cm/m   RA Area:     8.74 cm LA Vol (A2C):   58.1 ml 31.08 ml/m  RA Volume:   12.60 ml 6.74 ml/m LA Vol (A4C):   56.9 ml 30.44 ml/m LA Biplane Vol: 58.9 ml 31.51 ml/m  AORTIC VALVE AV Area (Vmax):    5.29 cm AV Area (Vmean):   1.55 cm AV Area (VTI):     1.56 cm AV Vmax:           247.00 cm/s AV Vmean:          190.500 cm/s AV  VTI:            0.401 m AV Peak Grad:      24.4 mmHg AV Mean Grad:      16.0 mmHg LVOT Vmax:         157.40 cm/s LVOT Vmean:        35.700 cm/s LVOT VTI:          0.075 m LVOT/AV VTI ratio: 0.19  AORTA Ao Asc diam: 4.90 cm MITRAL VALVE                TRICUSPID VALVE MV Area (PHT): 3.16 cm     TR Peak grad:   26.4 mmHg MV Area VTI:   1.39 cm     TR Vmax:        257.00 cm/s MV Peak grad:  17.1 mmHg MV Mean grad:  10.0 mmHg    SHUNTS MV Vmax:       2.07 m/s     Systemic VTI:  0.08 m MV Vmean:      151.0 cm/s   Systemic Diam: 3.25 cm MV Decel Time: 240 msec MV E velocity: 195.00 cm/s Rudean Haskell MD Electronically signed by Rudean Haskell MD Signature Date/Time: 12/15/2020/12:49:20 PM    Final      Assessment and Plan:   History of bioprosthetic mitral and aortic valves Bacteremia - ID following - will obtain TEE Friday at 2pm   History of atrial flutter/A. Fib Chronic anticoagulation -Maze procedure at the time of his valve replacement (2006) -He has undergone two A. fib/flutter ablations with [facility] clinic -He is maintained on metoprolol 25 mg BID, Tikosyn 500 mcg BID, and 5 mg of Eliquis BID - generally maintaining sinus rhythm here - will need to be notified if no longer taking PO medications or if  he has an break in tikosyn   Hyperlipidemia  - no obstructive CAD on heart cath in 2006 - continue 80 mg lipitor when reliably taking PO medications, not necessary during acute illness with questionable swallowing   Hypertension - hydralazine PRN - maintained on 25 mg losartan at home - monitor and provide support with IV medications   Recent SDH s/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 Stanford Breed. Wife will need to be consented. NPO at [facility] Thursday night.     Risk Assessment/Risk Scores:   CHA2DS2-VASc Score = 5   This indicates a 7.2% annual risk  of stroke. The patient's score is based upon: CHF History: 0 HTN History: 1 Diabetes History: 0 Stroke History: 2 Vascular Disease History: 0 Age Score: 2 Gender Score: 0       For questions or updates, please contact [place] Please consult [email or web address] for contact info under   Signed, Ledora Bottcher, PA  12/15/2020 5:03 PM  Patient seen, examined. Available data reviewed. Agree with findings, assessment, and plan as outlined by Doreene Adas, PA-C.  The patient is independently interviewed and examined.  He is not able to provide any history due to his cognitive status.  He does open his eyes and track appropriately.  He can follow a few commands such as squeezing my fingers with his right hand and wiggling the toes in his right foot.  He otherwise provides no history at all.  On my exam, he is awake and alert but otherwise noncommunicative verbally.  HEENT is normal, neck: JVP normal, carotid upstrokes normal without bruits, lungs are clear bilaterally, heart is regular rate and rhythm with no murmur gallop, abdomen is soft nontender, extremities have no edema on the right side and there is 1+ edema on the left side.  The left side appears weak on my neurologic exam.  Telemetry is reviewed and shows normal sinus rhythm with first-degree AV block and frequent PACs as well as short supraventricular runs.  I do not see any clear atrial fibrillation or flutter.  The patient is back on dofetilide and apixaban for management of his atrial fibrillation and he seems to be tolerating this relatively well.  Nursing has reported that it is difficult to get oral medicine in him.  Unfortunately, the patient has a history of bioprosthetic aortic and mitral valve replacement, now with Enterococcus faecalis bacteremia.  We have a high suspicion for bacterial endocarditis and the patient is scheduled for transesophageal echo on Friday.  Informed consent will need to be obtained from his wife who is  not present today.  The patient's transthoracic echo is reviewed in he has no obvious valvular dysfunction and specifically has no regurgitant lesions involving his aortic or mitral valve.  The patient is followed by the ID service and is on appropriate antibiotics.  There is mention that if endocarditis is confirmed, Rocephin will be added.  We will continue to follow with you.  Sherren Mocha, M.D. 12/15/2020 6:05 PM

## 2020-12-15 NOTE — Progress Notes (Signed)
PHARMACY - PHYSICIAN COMMUNICATION CRITICAL VALUE ALERT - BLOOD CULTURE IDENTIFICATION (BCID)  [name] [name] is an 80 y.o. male who presented to [facility] on 12/14/2020 with a chief complaint of lethargy/altered mental status  Assessment: Enterococcus faecalis bacteremia in setting of bioprosthetic AVR/MVR, WBC 11.1   Name of physician (or Provider) Contacted: Dr. Tonie Griffith  Current antibiotics: Ceftriaxone  Changes to prescribed antibiotics recommended:  -DC Ceftriaxone -Start Ampicillin 2g IV q6h (CrCl ~48) -Will give a one time dose of Gentamicin-f/u after ID sees patient -ID will get automatic consult   Narda Bonds, PharmD, BCPS Clinical Pharmacist Phone: [phone]   Results for orders placed or performed during the hospital encounter of 12/14/20  Blood Culture ID Panel (Reflexed) (Collected: 12/14/2020 12:45 PM)  Result Value Ref Range   Enterococcus faecalis DETECTED (A) NOT DETECTED   Enterococcus Faecium NOT DETECTED NOT DETECTED   Listeria monocytogenes NOT DETECTED NOT DETECTED   Staphylococcus species NOT DETECTED NOT DETECTED   Staphylococcus aureus (BCID) NOT DETECTED NOT DETECTED   Staphylococcus epidermidis NOT DETECTED NOT DETECTED   Staphylococcus lugdunensis NOT DETECTED NOT DETECTED   Streptococcus species NOT DETECTED NOT DETECTED   Streptococcus agalactiae NOT DETECTED NOT DETECTED   Streptococcus pneumoniae NOT DETECTED NOT DETECTED   Streptococcus pyogenes NOT DETECTED NOT DETECTED   A.calcoaceticus-baumannii NOT DETECTED NOT DETECTED   Bacteroides fragilis NOT DETECTED NOT DETECTED   Enterobacterales NOT DETECTED NOT DETECTED   Enterobacter cloacae complex NOT DETECTED NOT DETECTED   Escherichia coli NOT DETECTED NOT DETECTED   Klebsiella aerogenes NOT DETECTED NOT DETECTED   Klebsiella oxytoca NOT DETECTED NOT DETECTED   Klebsiella pneumoniae NOT DETECTED NOT DETECTED   Proteus species NOT DETECTED NOT DETECTED   Salmonella species NOT  DETECTED NOT DETECTED   Serratia marcescens NOT DETECTED NOT DETECTED   Haemophilus influenzae NOT DETECTED NOT DETECTED   Neisseria meningitidis NOT DETECTED NOT DETECTED   Pseudomonas aeruginosa NOT DETECTED NOT DETECTED   Stenotrophomonas maltophilia NOT DETECTED NOT DETECTED   Candida albicans NOT DETECTED NOT DETECTED   Candida auris NOT DETECTED NOT DETECTED   Candida glabrata NOT DETECTED NOT DETECTED   Candida krusei NOT DETECTED NOT DETECTED   Candida parapsilosis NOT DETECTED NOT DETECTED   Candida tropicalis NOT DETECTED NOT DETECTED   Cryptococcus neoformans/gattii NOT DETECTED NOT DETECTED   Vancomycin resistance NOT DETECTED NOT DETECTED

## 2020-12-15 NOTE — Consult Note (Addendum)
I have seen the patient and reviewed and agreed with the history/physical exam finding/labs with our resident Dr Christiana Fuchs. I agreed with the assessment/plan unless otherwise noted   Hpi: 80 y.o. 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, nursing home resident, admitted 10/10 for around a week confusion/decreased mentation, poor intake, soft sbp 90s, found to have enterococcus faecalis bacteremia  Patient also has had straight cath as well for urinary retention  He is hyposomnolent and hx via wife/chart  No other prosthesis  Exam: Somnolent, getting echo Skin no rash/stigmata SBE Normal respiratory effort Midline sternotomy scar; right chest scar Abd soft Neuro some movement all extremities; full exam not possible Msk no peripheral joint swelling/warmth Heent: normocephalic; per; conj clear   Labs/imaging reviewed Presenting cbc 12//12/119; sodium 154; cr 1.2 Chest x-ray no acute findings CT head shows no acute findings, small right subdural hematoma 3 mm compared with 5 mm previously.  No new hemorrhage.  Old right MCA and left parietal infarcts are stable.  Atrophy and chronic microvascular disease.   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

## 2020-12-15 NOTE — ED Notes (Signed)
Mouth care completed.

## 2020-12-15 NOTE — Plan of Care (Signed)
  Problem: Education: Goal: Knowledge of General Education information will improve Description Including pain rating scale, medication(s)/side effects and non-pharmacologic comfort measures Outcome: Progressing   

## 2020-12-15 NOTE — ED Notes (Signed)
MD notified that patient wife has many questions about status and room status with concerns for patient.

## 2020-12-15 NOTE — ED Notes (Signed)
Pt QTc is 540.  Tikosyn parameters require QTc be below 500 on EKG to administer. Per Pharmacy an ECG will need to be performed in the AM before administering morning dose.

## 2020-12-15 NOTE — Progress Notes (Signed)
PROGRESS NOTE    [name] [name]  GEX:[id number] DOB: 1940-04-21 DOA: 12/14/2020 PCP: [name], MD   Brief Narrative: [name] [name] is a 80 y.o. male with medical history significant of stroke, right subdural hematoma right craniotomy, he had an acute stroke 6/3 through 08/15/2020 had endovascular revascularization of ICA by Dr. Estanislado Pandy Inpatient rehab from 08/15/2020 through 09/17/2020 after an acute ischemic right MCA stroke prior to that he was in the hospital from Jul 27, 2020 through Jul 31, 2020 for right subdural hematoma and right craniotomy, chronic atrial fibrillation on Eliquis, congestive heart failure, history of aortic and mitral valve replacement patient was discharged to skilled nursing facility after inpatient rehab at [facility] where he was making very slow progress.  Family is very involved with his care especially his wife.  She noticed that patient was more lethargic not responding as usual with decreased p.o. intake.  Labs drawn in the nursing home sodium was 154 he got 2 bags of IV fluids with the sodium coming down to 148 and his mental status improved 3 days prior to admission however he became lethargic again with decreased responsiveness BP was low at 98/52 with weight loss of about 10 pounds in the past few weeks.  Wife reported that he did not have any fever nausea vomiting diarrhea shortness of breath or cough chest pain or abdominal pain.  She did tell me that he is prone to urinary tract infections. Patient has been sleeping most of the time, he is on trazodone for insomnia.  No reported seizure activity or incontinence or tongue biting.     ED Course: White count is 12 hemoglobin is 11.7 platelets 119 sodium 154, creatinine 1.18 with a baseline creatinine of around 1. Chest x-ray no acute findings CT head shows no acute findings, small right subdural hematoma 3 mm compared with 5 mm previously.  No new hemorrhage.  Old right MCA and left parietal infarcts are stable.   Atrophy and chronic microvascular disease.  Assessment & Plan:   Active Problems:   Pressure injury of skin   Hypernatremia   Bacteremia   #1 acute metabolic encephalopathy multifactorial secondary to hypernatremia/sepsis/UTI and/enterococcal bacteremia-his mental status is improving on IV fluids IV antibiotics with improvement in his electrolytes.  #2 hypernatremia resolving on half-normal saline at 75 cc an hour continue slow IV hydration and follow-up labs in AM.  #3 enterococcal bacteremia blood cultures positive ID consulted.  Transthoracic echo shows no findings of endocarditis.  Cardiology consulted for TEE. ID recommends ampicillin 2 g IV every 6  #4 sepsis present on admission patient was hypotensive tachypneic tachycardic with leukocytosis.  This is likely secondary to enterococcal bacteremia. Leukocytosis improving.  #5 chronic atrial fibrillation on Eliquis and Tikosyn and metoprolol.  Cardiology consulted.  #6 history of AVR and MVR on Eliquis  #7 history of seizure on Keppra Keppra changed to IV 500 twice daily.  #8 hyperlipidemia on statin. Pressure Injury 12/14/20 Elbow Left;Posterior Stage 2 -  Partial thickness loss of dermis presenting as a shallow open injury with a red, pink wound bed without slough. Pressure wound is round in shape. (Active)  12/14/20 1250  Location: Elbow  Location Orientation: Left;Posterior  Staging: Stage 2 -  Partial thickness loss of dermis presenting as a shallow open injury with a red, pink wound bed without slough.  Wound Description (Comments): Pressure wound is round in shape.  Present on Admission: Yes    Estimated body mass index is 26.03 kg/m  as calculated from the following:   Height as of this encounter: 5\' 7"  (1.702 m).   Weight as of this encounter: 75.4 kg.  DVT prophylaxis: Eliquis  code Status: DO NOT RESUSCITATE  family Communication: Discussed with wife at the bedside and discussed with son on the phone   disposition Plan:  Status is: Inpatient  Remains inpatient appropriate because:IV treatments appropriate due to intensity of illness or inability to take PO  Dispo: The patient is from: SNF              Anticipated d/c is to: SNF              Patient currently is not medically stable to d/c.   Difficult to place patient No       Consultants:  Cardiology, infectious disease  Procedures: None Antimicrobials: Ampicillin  Subjective: Patient is resting in bed he is more awake today he is able to follow some commands and trying to speak though his voice is very soft  Objective: Vitals:   12/15/20 0600 12/15/20 0800 12/15/20 0845 12/15/20 1200  BP: 104/76 99/66 105/78 120/67  Pulse: 92 83 84 73  Resp: (!) 26 (!) 27 (!) 25 (!) 33  Temp:      TempSrc:      SpO2: 97% 98% 95% 97%  Weight:      Height:        Intake/Output Summary (Last 24 hours) at 12/15/2020 1244 Last data filed at 12/15/2020 0732 Gross per 24 hour  Intake 1141.19 ml  Output --  Net 1141.19 ml   Filed Weights   12/14/20 1229  Weight: 75.4 kg    Examination:  General exam: Appears calm and comfortable  Respiratory system: Clear to auscultation. Respiratory effort normal. Cardiovascular system: S1 & S2 heard, RRR. No JVD, murmurs, rubs, gallops or clicks. No pedal edema. Gastrointestinal system: Abdomen is nondistended, soft and nontender. No organomegaly or masses felt. Normal bowel sounds heard. Central nervous system: Awake follows some commands trying to speak.  Left upper extremity 3x 5 right upper extremity 4 x 5 Decreased movement to the left side extremities no edema Skin: No rashes, lesions or ulcers Psychiatry: Judgement and insight appear normal. Mood & affect appropriate.     Data Reviewed: I have personally reviewed following labs and imaging studies  CBC: Recent Labs  Lab 12/10/20 0000 12/14/20 1245 12/14/20 1504 12/15/20 0508  WBC 11.6 12.0*  --  11.1*  NEUTROABS  --  9.3*   --   --   HGB 12.9* 11.7* 18.7* 9.8*  HCT 40* 36.8* 55.0* 31.6*  MCV  --  90.0  --  92.4  PLT 159 119*  --  161*   Basic Metabolic Panel: Recent Labs  Lab 12/10/20 0000 12/10/20 0000 12/11/20 0000 12/12/20 0000 12/13/20 0000 12/14/20 1245 12/14/20 1504 12/14/20 2056 12/15/20 0508  NA  --    < > 154* 148* 150* 149* 154*  --  148*  K 4.9  --  4.2 4.2 3.8 4.1 4.3  --  3.8  CL 119*  --  123* 118* 120* 119*  --   --  119*  CO2 21  --  21 21 22 22   --   --  23  GLUCOSE  --   --   --   --   --  204*  --   --  144*  BUN 41*  --  37* 32* 29* 32*  --   --  25*  CREATININE 1.2  --  1.1 1.1 1.0 1.18  --   --  0.91  CALCIUM 8.9  --  8.7 8.5* 8.3* 8.6*  --   --  8.1*  MG 2.6  --   --   --   --   --   --  2.3 2.2  PHOS 4.3  --   --   --   --   --   --   --   --    < > = values in this interval not displayed.   GFR: Estimated Creatinine Clearance: 62.5 mL/min (by C-G formula based on SCr of 0.91 mg/dL). Liver Function Tests: Recent Labs  Lab 12/10/20 0000 12/11/20 0000 12/12/20 0000 12/14/20 1245 12/15/20 0508  AST 55* 39 47* 63* 48*  ALT 77* 62* 78* 92* 75*  ALKPHOS 109 93 82 68 65  BILITOT  --   --   --  0.5 0.7  PROT  --   --   --  6.8 5.8*  ALBUMIN 3.3* 2.8* 2.9* 2.3* 2.0*   No results for input(s): LIPASE, AMYLASE in the last 168 hours. Recent Labs  Lab 12/14/20 1639  AMMONIA 25   Coagulation Profile: Recent Labs  Lab 12/14/20 1245 12/15/20 0508  INR 1.5* 1.5*   Cardiac Enzymes: No results for input(s): CKTOTAL, CKMB, CKMBINDEX, TROPONINI in the last 168 hours. BNP (last 3 results) No results for input(s): PROBNP in the last 8760 hours. HbA1C: No results for input(s): HGBA1C in the last 72 hours. CBG: No results for input(s): GLUCAP in the last 168 hours. Lipid Profile: No results for input(s): CHOL, HDL, LDLCALC, TRIG, CHOLHDL, LDLDIRECT in the last 72 hours. Thyroid Function Tests: Recent Labs    12/14/20 2056  TSH 1.567   Anemia Panel: No results  for input(s): VITAMINB12, FOLATE, FERRITIN, TIBC, IRON, RETICCTPCT in the last 72 hours. Sepsis Labs: Recent Labs  Lab 12/14/20 1245 12/14/20 1639 12/14/20 2056 12/15/20 0508  PROCALCITON  --   --   --  0.26  LATICACIDVEN 1.8 1.5 1.8  --     Recent Results (from the past 240 hour(s))  Culture, blood (Routine x 2)     Status: None (Preliminary result)   Collection Time: 12/14/20 12:45 PM   Specimen: BLOOD RIGHT FOREARM  Result Value Ref Range Status   Specimen Description BLOOD RIGHT FOREARM  Final   Special Requests   Final    BOTTLES DRAWN AEROBIC AND ANAEROBIC Blood Culture adequate volume   Culture  Setup Time   Final    GRAM POSITIVE COCCI IN BOTH AEROBIC AND ANAEROBIC BOTTLES CRITICAL RESULT CALLED TO, READ BACK BY AND VERIFIED WITH: PHARMD JAMES LEDFORD 12/15/2020@6 :07 BY TW Performed at [facility], [place] [address] [place], [place] [address]    Culture GRAM POSITIVE COCCI  Final   Report Status PENDING  Incomplete  Blood Culture ID Panel (Reflexed)     Status: Abnormal   Collection Time: 12/14/20 12:45 PM  Result Value Ref Range Status   Enterococcus faecalis DETECTED (A) NOT DETECTED Final    Comment: CRITICAL RESULT CALLED TO, READ BACK BY AND VERIFIED WITH: PHARMD JAMES LEDFORD 12/15/2020@6 :07 BY TW    Enterococcus Faecium NOT DETECTED NOT DETECTED Final   Listeria monocytogenes NOT DETECTED NOT DETECTED Final   Staphylococcus species NOT DETECTED NOT DETECTED Final   Staphylococcus aureus (BCID) NOT DETECTED NOT DETECTED Final   Staphylococcus epidermidis NOT DETECTED NOT DETECTED Final   Staphylococcus lugdunensis NOT  DETECTED NOT DETECTED Final   Streptococcus species NOT DETECTED NOT DETECTED Final   Streptococcus agalactiae NOT DETECTED NOT DETECTED Final   Streptococcus pneumoniae NOT DETECTED NOT DETECTED Final   Streptococcus pyogenes NOT DETECTED NOT DETECTED Final   A.calcoaceticus-baumannii NOT DETECTED NOT DETECTED Final   Bacteroides  fragilis NOT DETECTED NOT DETECTED Final   Enterobacterales NOT DETECTED NOT DETECTED Final   Enterobacter cloacae complex NOT DETECTED NOT DETECTED Final   Escherichia coli NOT DETECTED NOT DETECTED Final   Klebsiella aerogenes NOT DETECTED NOT DETECTED Final   Klebsiella oxytoca NOT DETECTED NOT DETECTED Final   Klebsiella pneumoniae NOT DETECTED NOT DETECTED Final   Proteus species NOT DETECTED NOT DETECTED Final   Salmonella species NOT DETECTED NOT DETECTED Final   Serratia marcescens NOT DETECTED NOT DETECTED Final   Haemophilus influenzae NOT DETECTED NOT DETECTED Final   Neisseria meningitidis NOT DETECTED NOT DETECTED Final   Pseudomonas aeruginosa NOT DETECTED NOT DETECTED Final   Stenotrophomonas maltophilia NOT DETECTED NOT DETECTED Final   Candida albicans NOT DETECTED NOT DETECTED Final   Candida auris NOT DETECTED NOT DETECTED Final   Candida glabrata NOT DETECTED NOT DETECTED Final   Candida krusei NOT DETECTED NOT DETECTED Final   Candida parapsilosis NOT DETECTED NOT DETECTED Final   Candida tropicalis NOT DETECTED NOT DETECTED Final   Cryptococcus neoformans/gattii NOT DETECTED NOT DETECTED Final   Vancomycin resistance NOT DETECTED NOT DETECTED Final    Comment: Performed at [facility], [address], [place], [place] [address]  Resp Panel by RT-PCR (Flu A&B, Covid) Nasopharyngeal Swab     Status: None   Collection Time: 12/14/20  3:41 PM   Specimen: Nasopharyngeal Swab; Nasopharyngeal(NP) swabs in vial transport medium  Result Value Ref Range Status   SARS Coronavirus 2 by RT PCR NEGATIVE NEGATIVE Final    Comment: (NOTE) SARS-CoV-2 target nucleic acids are NOT DETECTED.  The SARS-CoV-2 RNA is generally detectable in upper respiratory specimens during the acute phase of infection. The lowest concentration of SARS-CoV-2 viral copies this assay can detect is 138 copies/mL. A negative result does not preclude SARS-Cov-2 infection and should not be used as  the sole basis for treatment or other patient management decisions. A negative result may occur with  improper specimen collection/handling, submission of specimen other than nasopharyngeal swab, presence of viral mutation(s) within the areas targeted by this assay, and inadequate number of viral copies(<138 copies/mL). A negative result must be combined with clinical observations, patient history, and epidemiological information. The expected result is Negative.  Fact Sheet for Patients:  EntrepreneurPulse.com.au  Fact Sheet for Healthcare Providers:  IncredibleEmployment.be  This test is no t yet approved or cleared by the [place] FDA and  has been authorized for detection and/or diagnosis of SARS-CoV-2 by FDA under an Emergency Use Authorization (EUA). This EUA will remain  in effect (meaning this test can be used) for the duration of the COVID-19 declaration under Section 564(b)(1) of the Act, 21 U.S.C.section 360bbb-3(b)(1), unless the authorization is terminated  or revoked sooner.       Influenza A by PCR NEGATIVE NEGATIVE Final   Influenza B by PCR NEGATIVE NEGATIVE Final    Comment: (NOTE) The Xpert Xpress SARS-CoV-2/FLU/RSV plus assay is intended as an aid in the diagnosis of influenza from Nasopharyngeal swab specimens and should not be used as a sole basis for treatment. Nasal washings and aspirates are unacceptable for Xpert Xpress SARS-CoV-2/FLU/RSV testing.  Fact Sheet for Patients: EntrepreneurPulse.com.au  Fact Sheet for Healthcare Providers: IncredibleEmployment.be  This test is not yet approved or cleared by the [place] FDA and has been authorized for detection and/or diagnosis of SARS-CoV-2 by FDA under an Emergency Use Authorization (EUA). This EUA will remain in effect (meaning this test can be used) for the duration of the COVID-19 declaration under Section 564(b)(1) of  the Act, 21 U.S.C. section 360bbb-3(b)(1), unless the authorization is terminated or revoked.  Performed at [facility], [place] [address] [place], [place] [address]   Culture, blood (Routine x 2)     Status: None (Preliminary result)   Collection Time: 12/14/20  4:39 PM   Specimen: BLOOD  Result Value Ref Range Status   Specimen Description BLOOD RIGHT HAND  Final   Special Requests   Final    BOTTLES DRAWN AEROBIC AND ANAEROBIC Blood Culture results may not be optimal due to an inadequate volume of blood received in culture bottles   Culture   Final    NO GROWTH < 12 HOURS Performed at [facility], [place] [address], [place], [place] [address]    Report Status PENDING  Incomplete         Radiology Studies: CT Head Wo Contrast  Result Date: 12/14/2020 CLINICAL DATA:  Mental status change, unknown cause EXAM: CT HEAD WITHOUT CONTRAST TECHNIQUE: Contiguous axial images were obtained from the base of the skull through the vertex without intravenous contrast. 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 Old left parietal infarct. Findings are stable since prior study. Small right subdural hematoma again noted overlying the right frontoparietal lobe, 3 mm compared to 5 mm previously. There is atrophy and chronic small vessel disease changes. Ventriculomegaly likely rib related to ex vacuo dilatation. No acute infarct or acute hemorrhage. Vascular: No hyperdense vessel or unexpected calcification. Skull: Prior right temporoparietal craniotomy. No acute calvarial abnormality. Sinuses/Orbits: No acute findings Other: None IMPRESSION: Small right subdural hematoma, 3 mm compared with 5 mm previously. No new hemorrhage. Old right MCA and left parietal infarcts, stable. Ventriculomegaly related to ex vacuo dilatation. Atrophy, chronic microvascular disease. No acute intracranial abnormality. Electronically Signed   By: Rolm Baptise M.D.   On:  12/14/2020 13:50   DG Chest Port 1 View  Result Date: 12/14/2020 CLINICAL DATA:  Altered mental status EXAM: PORTABLE CHEST 1 VIEW COMPARISON:  Chest radiograph 08/07/2020 FINDINGS: Median sternotomy wires and mitral and aortic valve prostheses are stable. The cardiomediastinal silhouette is stable. There is calcified atherosclerotic plaque of the aortic arch. Lung volumes are low. There is no focal consolidation or pulmonary edema. There is no pleural effusion or pneumothorax. There is no acute osseous abnormality. IMPRESSION: Low lung volumes. Otherwise, no radiographic evidence of acute cardiopulmonary process. Electronically Signed   By: Valetta Mole M.D.   On: 12/14/2020 13:01        Scheduled Meds:  apixaban  5 mg Oral BID   dofetilide  500 mcg Oral BID   feeding supplement (GLUCERNA SHAKE)  237 mL Oral BID BM   finasteride  5 mg Oral Daily   sodium chloride flush  3 mL Intravenous Q12H   tamsulosin  0.4 mg Oral Daily   Continuous Infusions:  sodium chloride 75 mL/hr at 12/15/20 0732   sodium chloride     ampicillin (OMNIPEN) IV Stopped (12/15/20 0732)   levETIRAcetam Stopped (12/15/20 1212)     LOS: 0 days     Georgette Shell, MD  12/15/2020, 12:44  PM

## 2020-12-15 NOTE — Consult Note (Signed)
[facility] for Infectious Disease    Date of Admission:  12/14/2020   Total days of antibiotics 2  Received one dose of ceftriaxone Currently on Ampicillin 2 g  IV q 6hrs and received one time dose of Gentamicin.       Reason for Consult: Entercoccus faecalis bacteremia    Referring Provider: Landis Gandy, MD Primary Care Provider: Scarlette Calico, MD  A/P: Enterococcus faecalis bacteremia Patient presented with several weeks duration of altered mental status, decreased oral intake, and weight loss.  On presentation, vitals included sBPs in the 90s, tachypnea, tachycardia, afebrile at 99.3 degrees fahrenheit with WBC of 12.  Blood cultures were drawn and grew Enterococcus faecalis in 1 of 2 sets.  Patient's antibiotics switched from ceftriaxone to Ampicillin with a dose of gentamicin for synergistic effect.  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 TTE showed no signs of vegetation of valves.  If endocarditis confirmed will add ceftriaxone. -TEE scheduled for Friday at 2 pm -Repeat blood culture -Consult cardiology  2. Urinary Tract Infection Patient has history of Pseudomonas Aerginosa UTI.  His wife reports that following subdural hematoma and stroke, [name] had urinary retention that required frequent straight caths.  Enterococcus bacteremia could be related to UTI. Urine culture pending, UA showed + leukocytes and nitrites. -Follow-up on urinary culture -Continue Ampicillin   Active Problems:   Pressure injury of skin   Hypernatremia   Bacteremia    apixaban  5 mg Oral BID   dofetilide  500 mcg Oral BID   feeding supplement (GLUCERNA SHAKE)  237 mL Oral BID BM   finasteride  5 mg Oral Daily   potassium chloride  20 mEq Oral Once   sodium chloride flush  3 mL Intravenous Q12H   tamsulosin  0.4 mg Oral Daily    HPI: [name] [name] is a 80 y.o. 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  Over the last four weeks his wife has noticed a decline in function.  He sleeps a more and is no longer able to participate in physical therapy.  He has also decreased oral intake and has lost weight due to this.  On 10/07, labs were drawn at skilled nursing facility and sodium was elevated at 154. This improved with fluid resuscitation, however he continued to be sleepy and was brought to the ED for further evaluation.    On lab evaluation in the ED, patient was found to have WBC of 12, hemoglobin of 11.7, platelets of 119, sodium of 154, creatinine 1.18 with baseline creatinine around 1. CXR with no acute findings, and CT head showed small right subdural hematoma of 3 mm compared with 5 mm previously.  Blood culture drawn and grew enterococcus faecalis, UA positive for nitrites and leukocytes. Infectious disease consulted.   Review of Systems: Patient was unable to answer ROS questions due to somnolence.  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    Other and unspecified hyperlipidemia    Pre-diabetes    Previous back surgery 1978, jan 2007   Psychosexual dysfunction with inhibited sexual excitement    SOB (shortness of breath)    "with heavy exercise"   Stroke ([place]) 08/2013   "I WAS IN AFIB AND THREW A CLOT, THE EFFECTS WERE TRANSITORY AND DIDNT LAST BUT FOR 30 MINUTES"    Thoracic aortic aneurysm     Social History   Tobacco Use   Smoking status: Never   Smokeless tobacco: Never  Vaping Use   Vaping Use: Never used  Substance Use Topics   Alcohol use: Never    Comment: wine occasionally   Drug use: Never    Family History  Problem Relation Age of Onset   Macular degeneration Mother        macular degeneration   Cancer Father        intestinal/GI   Diabetes Paternal Aunt    Heart attack Maternal Grandfather    Diabetes Brother    Allergies  Allergen Reactions   Naproxen Hives   No Healthtouch Food Allergies Other (See Comments)    Scallops - distress, nausea and vomitting    OBJECTIVE: Blood pressure 93/68, pulse 81, temperature 98.6 F (37 C), temperature source Axillary, resp. rate 15, height 5\' 7"  (1.702 m), weight 75.4 kg, SpO2 97 %.  Physical Exam Constitutional:      Comments: Somnolent, does not respond to questions or touch  HENT:     Head: Normocephalic and atraumatic.  Eyes:     Comments: Pupils equal bilaterally  Cardiovascular:     Rate and Rhythm: Normal rate and regular rhythm.     Heart sounds: No murmur heard. Pulmonary:     Effort: Pulmonary effort is normal.  Musculoskeletal:     Comments: Midsternal scar from previous surgery  Skin:    General: Skin is warm and dry.  Neurological:     Comments: Moves extremities spontaneously, does not follow commands.    Lab Results Lab Results  Component Value Date   WBC 11.1 (H) 12/15/2020   HGB 9.8 (L) 12/15/2020   HCT 31.6 (L) 12/15/2020   MCV 92.4 12/15/2020   PLT 108 (L)  12/15/2020    Lab Results  Component Value Date   CREATININE 0.91 12/15/2020   BUN 25 (H) 12/15/2020   NA 148 (H) 12/15/2020   K 3.8 12/15/2020   CL 119 (H) 12/15/2020   CO2 23 12/15/2020    Lab Results  Component Value Date   ALT 75 (H) 12/15/2020   AST 48 (H) 12/15/2020   ALKPHOS 65 12/15/2020   BILITOT 0.7 12/15/2020     Microbiology: Recent Results (from the past 240 hour(s))  Culture, blood (Routine x 2)     Status: None (Preliminary result)   Collection Time: 12/14/20 12:45 PM   Specimen: BLOOD RIGHT FOREARM  Result Value Ref Range Status   Specimen Description BLOOD RIGHT  FOREARM  Final   Special Requests   Final    BOTTLES DRAWN AEROBIC AND ANAEROBIC Blood Culture adequate volume   Culture  Setup Time   Final    GRAM POSITIVE COCCI IN BOTH AEROBIC AND ANAEROBIC BOTTLES CRITICAL RESULT CALLED TO, READ BACK BY AND VERIFIED WITH: PHARMD JAMES LEDFORD 12/15/2020@6 :07 BY TW Performed at [facility], [place] [address], [place], [place] [address]    Culture GRAM POSITIVE COCCI  Final   Report Status PENDING  Incomplete  Blood Culture ID Panel (Reflexed)     Status: Abnormal   Collection Time: 12/14/20 12:45 PM  Result Value Ref Range Status   Enterococcus faecalis DETECTED (A) NOT DETECTED Final    Comment: CRITICAL RESULT CALLED TO, READ BACK BY AND VERIFIED WITH: PHARMD JAMES LEDFORD 12/15/2020@6 :07 BY TW    Enterococcus Faecium NOT DETECTED NOT DETECTED Final   Listeria monocytogenes NOT DETECTED NOT DETECTED Final   Staphylococcus species NOT DETECTED NOT DETECTED Final   Staphylococcus aureus (BCID) NOT DETECTED NOT DETECTED Final   Staphylococcus epidermidis NOT DETECTED NOT DETECTED Final   Staphylococcus lugdunensis NOT DETECTED NOT DETECTED Final   Streptococcus species NOT DETECTED NOT DETECTED Final   Streptococcus agalactiae NOT DETECTED NOT DETECTED Final   Streptococcus pneumoniae NOT DETECTED NOT DETECTED Final   Streptococcus pyogenes NOT  DETECTED NOT DETECTED Final   A.calcoaceticus-baumannii NOT DETECTED NOT DETECTED Final   Bacteroides fragilis NOT DETECTED NOT DETECTED Final   Enterobacterales NOT DETECTED NOT DETECTED Final   Enterobacter cloacae complex NOT DETECTED NOT DETECTED Final   Escherichia coli NOT DETECTED NOT DETECTED Final   Klebsiella aerogenes NOT DETECTED NOT DETECTED Final   Klebsiella oxytoca NOT DETECTED NOT DETECTED Final   Klebsiella pneumoniae NOT DETECTED NOT DETECTED Final   Proteus species NOT DETECTED NOT DETECTED Final   Salmonella species NOT DETECTED NOT DETECTED Final   Serratia marcescens NOT DETECTED NOT DETECTED Final   Haemophilus influenzae NOT DETECTED NOT DETECTED Final   Neisseria meningitidis NOT DETECTED NOT DETECTED Final   Pseudomonas aeruginosa NOT DETECTED NOT DETECTED Final   Stenotrophomonas maltophilia NOT DETECTED NOT DETECTED Final   Candida albicans NOT DETECTED NOT DETECTED Final   Candida auris NOT DETECTED NOT DETECTED Final   Candida glabrata NOT DETECTED NOT DETECTED Final   Candida krusei NOT DETECTED NOT DETECTED Final   Candida parapsilosis NOT DETECTED NOT DETECTED Final   Candida tropicalis NOT DETECTED NOT DETECTED Final   Cryptococcus neoformans/gattii NOT DETECTED NOT DETECTED Final   Vancomycin resistance NOT DETECTED NOT DETECTED Final    Comment: Performed at [facility], [address] [place], [place] [address]  Resp Panel by RT-PCR (Flu A&B, Covid) Nasopharyngeal Swab     Status: None   Collection Time: 12/14/20  3:41 PM   Specimen: Nasopharyngeal Swab; Nasopharyngeal(NP) swabs in vial transport medium  Result Value Ref Range Status   SARS Coronavirus 2 by RT PCR NEGATIVE NEGATIVE Final    Comment: (NOTE) SARS-CoV-2 target nucleic acids are NOT DETECTED.  The SARS-CoV-2 RNA is generally detectable in upper respiratory specimens during the acute phase of infection. The lowest concentration of SARS-CoV-2 viral copies this assay can  detect is 138 copies/mL. A negative result does not preclude SARS-Cov-2 infection and should not be used as the sole basis for treatment or other patient management decisions. A negative result may occur with  improper specimen collection/handling, submission of specimen other than nasopharyngeal swab, presence of viral mutation(s) within  the areas targeted by this assay, and inadequate number of viral copies(<138 copies/mL). A negative result must be combined with clinical observations, patient history, and epidemiological information. The expected result is Negative.  Fact Sheet for Patients:  EntrepreneurPulse.com.au  Fact Sheet for Healthcare Providers:  IncredibleEmployment.be  This test is no t yet approved or cleared by the [place] FDA and  has been authorized for detection and/or diagnosis of SARS-CoV-2 by FDA under an Emergency Use Authorization (EUA). This EUA will remain  in effect (meaning this test can be used) for the duration of the COVID-19 declaration under Section 564(b)(1) of the Act, 21 U.S.C.section 360bbb-3(b)(1), unless the authorization is terminated  or revoked sooner.       Influenza A by PCR NEGATIVE NEGATIVE Final   Influenza B by PCR NEGATIVE NEGATIVE Final    Comment: (NOTE) The Xpert Xpress SARS-CoV-2/FLU/RSV plus assay is intended as an aid in the diagnosis of influenza from Nasopharyngeal swab specimens and should not be used as a sole basis for treatment. Nasal washings and aspirates are unacceptable for Xpert Xpress SARS-CoV-2/FLU/RSV testing.  Fact Sheet for Patients: EntrepreneurPulse.com.au  Fact Sheet for Healthcare Providers: IncredibleEmployment.be  This test is not yet approved or cleared by the [place] FDA and has been authorized for detection and/or diagnosis of SARS-CoV-2 by FDA under an Emergency Use Authorization (EUA). This EUA will remain in  effect (meaning this test can be used) for the duration of the COVID-19 declaration under Section 564(b)(1) of the Act, 21 U.S.C. section 360bbb-3(b)(1), unless the authorization is terminated or revoked.  Performed at [facility], [place] [address], [place], [place] [address]   Culture, blood (Routine x 2)     Status: None (Preliminary result)   Collection Time: 12/14/20  4:39 PM   Specimen: BLOOD  Result Value Ref Range Status   Specimen Description BLOOD RIGHT HAND  Final   Special Requests   Final    BOTTLES DRAWN AEROBIC AND ANAEROBIC Blood Culture results may not be optimal due to an inadequate volume of blood received in culture bottles   Culture  Setup Time   Final    GRAM POSITIVE COCCI IN CHAINS IN BOTH AEROBIC AND ANAEROBIC BOTTLES CRITICAL VALUE NOTED.  VALUE IS CONSISTENT WITH PREVIOUSLY REPORTED AND CALLED VALUE. Performed at [facility], [place] [address], [place], [place] [address]    Culture GRAM POSITIVE COCCI  Final   Report Status PENDING  Incomplete    Christiana Fuchs, [place] residency Internal Medicine Resident PGY-1 Pager: [phone]  12/15/2020 5:19 PM

## 2020-12-15 NOTE — ED Notes (Signed)
EKG reshot for QTc interval. Qtc now 400, RN able to give Tikosyn when due based on Qtc parameters being below 500 per pharmacy.

## 2020-12-16 ENCOUNTER — Telehealth: Payer: Self-pay | Admitting: Physical Medicine & Rehabilitation

## 2020-12-16 DIAGNOSIS — R7881 Bacteremia: Secondary | ICD-10-CM | POA: Diagnosis not present

## 2020-12-16 DIAGNOSIS — I480 Paroxysmal atrial fibrillation: Secondary | ICD-10-CM | POA: Diagnosis not present

## 2020-12-16 DIAGNOSIS — E870 Hyperosmolality and hypernatremia: Secondary | ICD-10-CM | POA: Diagnosis not present

## 2020-12-16 DIAGNOSIS — G9341 Metabolic encephalopathy: Secondary | ICD-10-CM

## 2020-12-16 LAB — COMPREHENSIVE METABOLIC PANEL
ALT: 76 U/L — ABNORMAL HIGH (ref 0–44)
AST: 56 U/L — ABNORMAL HIGH (ref 15–41)
Albumin: 1.9 g/dL — ABNORMAL LOW (ref 3.5–5.0)
Alkaline Phosphatase: 66 U/L (ref 38–126)
Anion gap: 7 (ref 5–15)
BUN: 26 mg/dL — ABNORMAL HIGH (ref 8–23)
CO2: 22 mmol/L (ref 22–32)
Calcium: 8.1 mg/dL — ABNORMAL LOW (ref 8.9–10.3)
Chloride: 117 mmol/L — ABNORMAL HIGH (ref 98–111)
Creatinine, Ser: 0.84 mg/dL (ref 0.61–1.24)
GFR, Estimated: >60 mL/min (ref >60)
Glucose, Bld: 145 mg/dL — ABNORMAL HIGH (ref 70–99)
Potassium: 3.6 mmol/L (ref 3.5–5.1)
Sodium: 146 mmol/L — ABNORMAL HIGH (ref 135–145)
Total Bilirubin: 0.5 mg/dL (ref 0.3–1.2)
Total Protein: 5.8 g/dL — ABNORMAL LOW (ref 6.5–8.1)

## 2020-12-16 LAB — CBC WITH DIFFERENTIAL/PLATELET
Abs Immature Granulocytes: 0.07 10*3/uL (ref 0.00–0.07)
Basophils Absolute: 0 10*3/uL (ref 0.0–0.1)
Basophils Relative: 0 %
Eosinophils Absolute: 0.2 10*3/uL (ref 0.0–0.5)
Eosinophils Relative: 1 %
HCT: 30.7 % — ABNORMAL LOW (ref 39.0–52.0)
Hemoglobin: 9.9 g/dL — ABNORMAL LOW (ref 13.0–17.0)
Immature Granulocytes: 1 %
Lymphocytes Relative: 19 %
Lymphs Abs: 2 10*3/uL (ref 0.7–4.0)
MCH: 28.4 pg (ref 26.0–34.0)
MCHC: 32.2 g/dL (ref 30.0–36.0)
MCV: 88 fL (ref 80.0–100.0)
Monocytes Absolute: 0.8 10*3/uL (ref 0.1–1.0)
Monocytes Relative: 7 %
Neutro Abs: 7.7 10*3/uL (ref 1.7–7.7)
Neutrophils Relative %: 72 %
Platelets: 106 10*3/uL — ABNORMAL LOW (ref 150–400)
RBC: 3.49 MIL/uL — ABNORMAL LOW (ref 4.22–5.81)
RDW: 16.7 % — ABNORMAL HIGH (ref 11.5–15.5)
WBC: 10.7 10*3/uL — ABNORMAL HIGH (ref 4.0–10.5)
nRBC: 0 % (ref 0.0–0.2)

## 2020-12-16 LAB — MRSA NEXT GEN BY PCR, NASAL: MRSA by PCR Next Gen: NOT DETECTED

## 2020-12-16 LAB — PROCALCITONIN: Procalcitonin: 0.24 ng/mL

## 2020-12-16 LAB — MAGNESIUM: Magnesium: 2.1 mg/dL (ref 1.7–2.4)

## 2020-12-16 MED ORDER — ORAL CARE MOUTH RINSE
15.0000 mL | Freq: Two times a day (BID) | OROMUCOSAL | Status: DC
  Administered 2020-12-16 – 2021-01-05 (×41): 15 mL via OROMUCOSAL

## 2020-12-16 MED ORDER — POTASSIUM CHLORIDE CRYS ER 20 MEQ PO TBCR
40.0000 meq | EXTENDED_RELEASE_TABLET | ORAL | Status: DC
Start: 2020-12-16 — End: 2020-12-16
  Filled 2020-12-16: qty 2

## 2020-12-16 MED ORDER — POTASSIUM CHLORIDE 20 MEQ PO PACK
40.0000 meq | PACK | ORAL | Status: AC
  Administered 2020-12-16 (×2): 40 meq via ORAL
  Filled 2020-12-16 (×2): qty 2

## 2020-12-16 MED ORDER — ADULT MULTIVITAMIN W/MINERALS CH
1.0000 | ORAL_TABLET | Freq: Every day | ORAL | Status: DC
  Administered 2020-12-16 – 2020-12-18 (×3): 1 via ORAL
  Filled 2020-12-16 (×3): qty 1

## 2020-12-16 NOTE — NC FL2 (Signed)
[place] LEVEL OF CARE SCREENING TOOL     IDENTIFICATION  Patient Name: [name] [name] Birthdate: 06/25/1940 Sex: male Admission Date (Current Location): 12/14/2020  [facility] and [place] Number:  Herbalist and Address:  [facility], [place] [address] [place], [place] [address]      Provider Number: [id number]  Attending Physician Name and Address:  Jonetta Osgood, MD  Relative Name and Phone Number:  Tionne Dayhoff (Spouse) [phone]    Current Level of Care: Hospital Recommended Level of Care: [place] Prior Approval Number:    Date Approved/Denied:   PASRR Number: [id number]  Discharge Plan: SNF    Current Diagnoses: Patient Active Problem List   Diagnosis Date Noted   Bacteremia 12/15/2020   History of aortic valve replacement    History of mitral valve replacement    Pressure injury of skin 12/14/2020   Hypernatremia 12/14/2020   Somnolence    Diabetes mellitus type II, controlled ([place]) 10/15/2020   Oropharyngeal dysphagia    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[place])    Acute ischemic right MCA stroke ([place]) 08/15/2020   CVA (cerebral vascular accident) ([place]) 08/07/2020   Middle cerebral artery embolism, 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   Subdural hemorrhage following injury 07/31/2020   S/P craniotomy 07/28/2020   Subdural hematoma 07/27/2020   Vertigo 08/30/2018   S/P right THA, 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   Long term (current) use of anticoagulants 01/05/2018   History of food allergy 12/11/2017   Allergic reaction 12/11/2017   Emesis 12/11/2017   Visit for monitoring Tikosyn therapy 06/06/2017   Internal hemorrhoid, bleeding 05/19/2016   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   Aortic valve disorder 07/09/2015   Long term current use of anticoagulant therapy 01/19/2015   Atrial fibrillation, chronic ([place]) 01/20/2014    Elevated PSA 12/23/2013   Prediabetes 12/19/2013   Benign prostatic hyperplasia with urinary obstruction 10/29/2013   left frontal cerebral infarct secondary to afib 08/05/2013   Atrial flutter ([place]) 05/17/2013   Essential hypertension 06/19/2012   Routine health maintenance 04/26/2011   Hyperlipidemia with target LDL less than 130 07/07/2008   History of colonic polyps 06/16/2008   ERECTILE DYSFUNCTION 09/25/2007    Orientation RESPIRATION BLADDER Height & Weight     Self, Situation, Place, Time  Normal Incontinent, External catheter Weight: 166 lb 3.2 oz (75.4 kg) Height:  5\' 7"  (170.2 cm)  BEHAVIORAL SYMPTOMS/MOOD NEUROLOGICAL BOWEL NUTRITION STATUS      Incontinent Diet (see dc summary)  AMBULATORY STATUS COMMUNICATION OF NEEDS Skin   Extensive Assist Non-Verbally (Whispers at times) Other (Comment) (Elbow left;Posterior stage 2- Partial thickness loss of dermis presenting as a shallow open injury with a red, pink wound bed without slough. Pressure wound is round in shape.)                       Personal Care Assistance Level of Assistance  Bathing, Feeding, Dressing Bathing Assistance: Maximum assistance Feeding assistance: Limited assistance Dressing Assistance: Limited assistance     Functional Limitations Info  Sight, Hearing, Speech Sight Info: Adequate Hearing Info: Adequate Speech Info: Impaired    SPECIAL CARE FACTORS FREQUENCY                       Contractures Contractures Info: Not present    Additional Factors Info  Code Status, Allergies Code Status Info: DNR Allergies Info: Allergies: Naproxen, No Healthtouch Food Allergies           Current Medications (12/16/2020):  This is the current hospital active medication list Current Facility-Administered Medications  Medication Dose Route Frequency Provider Last Rate Last Admin   0.45 % sodium chloride infusion   Intravenous Continuous Jonetta Osgood, MD 75 mL/hr at 12/16/20 1307  New Bag at 12/16/20 1307   0.9 %  sodium chloride infusion  250 mL Intravenous PRN Georgette Shell, MD       ampicillin (OMNIPEN) 2 g in sodium chloride 0.9 % 100 mL IVPB  2 g Intravenous Q6H Erenest Blank, RPH 300 mL/hr at 12/16/20 1338 2 g at 12/16/20 1338   apixaban (ELIQUIS) tablet 5 mg  5 mg Oral BID Chotiner, Yevonne Aline, MD   5 mg at 12/16/20 1003   dofetilide (TIKOSYN) capsule 500 mcg  500 mcg Oral BID Georgette Shell, MD   500 mcg at 12/16/20 1003   feeding supplement (GLUCERNA SHAKE) (GLUCERNA SHAKE) liquid 237 mL  237 mL Oral BID BM Georgette Shell, MD   237 mL at 12/16/20 1006   finasteride (PROSCAR) tablet 5 mg  5 mg Oral Daily Georgette Shell, MD   5 mg at 12/16/20 1003   levETIRAcetam (KEPPRA) IVPB 500 mg/100 mL premix  500 mg Intravenous Q12H Georgette Shell, MD 400 mL/hr at 12/16/20 1110 500 mg at 12/16/20 1110   MEDLINE mouth rinse  15 mL Mouth Rinse BID Jonetta Osgood, MD   15 mL at 12/16/20 1004   potassium chloride (KLOR-CON) packet 40 mEq  40 mEq Oral Q4H Ghimire, Shanker M, MD       sodium chloride flush (NS) 0.9 % injection 3 mL  3 mL Intravenous Q12H Georgette Shell, MD   3 mL at 12/16/20 1004   sodium chloride flush (NS) 0.9 % injection 3 mL  3 mL Intravenous PRN Georgette Shell, MD       tamsulosin [facility]) capsule 0.4 mg  0.4 mg Oral Daily Georgette Shell, MD   0.4 mg at 12/16/20 1003     Discharge Medications: Please see discharge summary for a list of discharge medications.  Relevant Imaging Results:  Relevant Lab Results:   Additional Information SSN#: [id number].Moderna COVID-19 Vaccine 05/05/2019 , 04/08/2019  Moderna Covid-19 Booster Vaccine 06/26/2020 , 01/02/2020  Benard Halsted, LCSW

## 2020-12-16 NOTE — Progress Notes (Signed)
PROGRESS NOTE        PATIENT DETAILS Name: [name] [name] Age: 80 y.o. Sex: male Date of Birth: 03-19-1940 Admit Date: 12/14/2020 Admitting Physician Georgette Shell, MD YCX:[id number], Arvid Right, MD  Brief Narrative: Patient is a 80 y.o. 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  Subjective: Arouses to a loud verbal stimuli-speaks in whispers-occasionally voice is loud and clear.  Objective: Vitals: Blood pressure (!) 95/54, pulse 74, temperature 97.8 F (36.6 C), temperature source Axillary, resp. rate 16, height 5\' 7"  (1.702 m), weight 75.4 kg, SpO2 98 %.   Exam: Gen Exam:Alert awake-not in any distress HEENT:atraumatic, normocephalic Chest: B/L clear to auscultation anteriorly CVS:S1S2 regular Abdomen:soft non tender, non distended Extremities:no edema Neurology: Appears to have significant left-sided hemiparesis at baseline. Skin: no rash  Pertinent Labs/Radiology: WBC: 10.7 Hb: 9.9 PLT: 106 Na: 146 K: 3.6 Creatinine: 0.84  10/10>>Blood culture: Enterococcus 10/10>> urine culture: Staff aureus 10/11>> blood culture: No growth  10/10>> CT head: Small right subdural hematoma 10/11>> TTE: EF-65-70%, no obvious vegetation.  Assessment/Plan: Acute metabolic encephalopathy: Due to hypernatremia and enterococcal bacteremia.  Still very lethargic-but able to arouse and answer questions occasionally.  Continue supportive care-if he remains lethargic-May need further work-up with MRI brain/EEG.  Sepsis secondary to enterococcal bacteremia: Sepsis  physiology improving-continues to have lingering encephalopathy-on IV ampicillin-ID following-repeat blood culture on 10/11 negative so far-TTE without obvious vegetations-TEE planned for 10/14.  Hypernatremia: Improving-encourage oral intake.  Continue gentle hydration with IVF.  PAF: Continue Tikosyn-remains on Eliquis.  History of seizure disorder: On Keppra  Recent SDH May 2022: Prior MD-Dr. 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  Continues to have significant left-sided hemiparesis.  On Eliquis.  History of rheumatic heart disease-s/p remote AVR/MVR (bioprosthetic valve)  Severe failure to thrive syndrome/frailty: Worsened by acute illness-having very poor oral intake per family-plans are to continue supportive careIV antibiotics-and reassess on 10/14 for possible short-term cortack tube placement for enteral feeding.     Procedures: None Consults: None DVT Prophylaxis: Eliquis Code Status:DNR Family Communication: [name] at [employer] over the phone.  Time spent: 35 minutes-Greater than 50% of this time was spent in counseling, explanation of diagnosis, planning of further management, and coordination of care.  Diet: Diet Order             DIET DYS 3 Room service appropriate? Yes; Fluid consistency: Nectar Thick  Diet effective now                      Disposition Plan: Status is: Inpatient  Remains inpatient appropriate because:Inpatient level of care appropriate due to severity of illness  Dispo: The patient is from: SNF              Anticipated d/c is to: SNF              Patient currently is not medically stable to d/c.   Difficult  to place patient No    Barriers to Discharge: Lingering encephalopathy-enterococcal bacteremia-TEE on  10/14-remains on IV ampicillin.  Antimicrobial agents: Anti-infectives (From admission, onward)    Start     Dose/Rate Route Frequency Ordered Stop   12/15/20 1700  cefTRIAXone (ROCEPHIN) 1 g in sodium chloride 0.9 % 100 mL IVPB  Status:  Discontinued        1 g 200 mL/hr over 30 Minutes Intravenous Every 24 hours 12/14/20 1648 12/15/20 0624   12/15/20 0630  ampicillin (OMNIPEN) 2 g in sodium chloride 0.9 % 100 mL IVPB        2 g 300 mL/hr over 20 Minutes Intravenous Every 6 hours 12/15/20 0624     12/15/20 0630  gentamicin (GARAMYCIN) IVPB 80 mg        80 mg 100 mL/hr over 30 Minutes Intravenous  Once 12/15/20 0628 12/15/20 0732   12/14/20 1645  cefTRIAXone (ROCEPHIN) 1 g in sodium chloride 0.9 % 100 mL IVPB        1 g 200 mL/hr over 30 Minutes Intravenous  Once 12/14/20 1642 12/14/20 1830        MEDICATIONS: Scheduled Meds:  apixaban  5 mg Oral BID   dofetilide  500 mcg Oral BID   feeding supplement (GLUCERNA SHAKE)  237 mL Oral BID BM   finasteride  5 mg Oral Daily   mouth rinse  15 mL Mouth Rinse BID   potassium chloride  40 mEq Oral Q4H   sodium chloride flush  3 mL Intravenous Q12H   tamsulosin  0.4 mg Oral Daily   Continuous Infusions:  sodium chloride 75 mL/hr at 12/16/20 1307   sodium chloride     ampicillin (OMNIPEN) IV 2 g (12/16/20 1338)   levETIRAcetam 500 mg (12/16/20 1110)   PRN Meds:.sodium chloride, sodium chloride flush   I have personally reviewed following labs and imaging studies  LABORATORY DATA: CBC: Recent Labs  Lab 12/10/20 0000 12/14/20 1245 12/14/20 1504 12/15/20 0508 12/16/20 0130  WBC 11.6 12.0*  --  11.1* 10.7*  NEUTROABS  --  9.3*  --   --  7.7  HGB 12.9* 11.7* 18.7* 9.8* 9.9*  HCT 40* 36.8* 55.0* 31.6* 30.7*  MCV  --  90.0  --  92.4 88.0  PLT 159 119*  --  108* 106*    Basic Metabolic Panel: Recent Labs  Lab 12/10/20 0000 12/11/20 0000 12/12/20 0000 12/13/20 0000 12/14/20 1245 12/14/20 1504 12/14/20 2056  12/15/20 0508 12/16/20 0130  NA  --    < > 148* 150* 149* 154*  --  148* 146*  K 4.9   < > 4.2 3.8 4.1 4.3  --  3.8 3.6  CL 119*   < > 118* 120* 119*  --   --  119* 117*  CO2 21   < > 21 22 22   --   --  23 22  GLUCOSE  --   --   --   --  204*  --   --  144* 145*  BUN 41*   < > 32* 29* 32*  --   --  25* 26*  CREATININE 1.2   < > 1.1 1.0 1.18  --   --  0.91 0.84  CALCIUM 8.9   < > 8.5* 8.3* 8.6*  --   --  8.1* 8.1*  MG 2.6  --   --   --   --   --  2.3 2.2 2.1  PHOS 4.3  --   --   --   --   --   --   --   --    < > =  values in this interval not displayed.    GFR: Estimated Creatinine Clearance: 67.8 mL/min (by C-G formula based on SCr of 0.84 mg/dL).  Liver Function Tests: Recent Labs  Lab 12/11/20 0000 12/12/20 0000 12/14/20 1245 12/15/20 0508 12/16/20 0130  AST 39 47* 63* 48* 56*  ALT 62* 78* 92* 75* 76*  ALKPHOS 93 82 68 65 66  BILITOT  --   --  0.5 0.7 0.5  PROT  --   --  6.8 5.8* 5.8*  ALBUMIN 2.8* 2.9* 2.3* 2.0* 1.9*   No results for input(s): LIPASE, AMYLASE in the last 168 hours. Recent Labs  Lab 12/14/20 1639  AMMONIA 25    Coagulation Profile: Recent Labs  Lab 12/14/20 1245 12/15/20 0508  INR 1.5* 1.5*    Cardiac Enzymes: No results for input(s): CKTOTAL, CKMB, CKMBINDEX, TROPONINI in the last 168 hours.  BNP (last 3 results) No results for input(s): PROBNP in the last 8760 hours.  Lipid Profile: No results for input(s): CHOL, HDL, LDLCALC, TRIG, CHOLHDL, LDLDIRECT in the last 72 hours.  Thyroid Function Tests: Recent Labs    12/14/20 2056  TSH 1.567    Anemia Panel: No results for input(s): VITAMINB12, FOLATE, FERRITIN, TIBC, IRON, RETICCTPCT in the last 72 hours.  Urine analysis:    Component Value Date/Time   COLORURINE YELLOW 12/14/2020 1600   APPEARANCEUR CLOUDY (A) 12/14/2020 1600   LABSPEC 1.020 12/14/2020 1600   PHURINE 6.0 12/14/2020 1600   GLUCOSEU NEGATIVE 12/14/2020 1600   HGBUR LARGE (A) 12/14/2020 1600   BILIRUBINUR  NEGATIVE 12/14/2020 1600   BILIRUBINUR negative 11/22/2013 0848   KETONESUR NEGATIVE 12/14/2020 1600   PROTEINUR NEGATIVE 12/14/2020 1600   UROBILINOGEN 0.2 11/22/2013 0848   UROBILINOGEN 1.0 08/05/2013 2207   NITRITE POSITIVE (A) 12/14/2020 1600   LEUKOCYTESUR LARGE (A) 12/14/2020 1600    Sepsis Labs: Lactic Acid, Venous    Component Value Date/Time   LATICACIDVEN 1.8 12/14/2020 2056    MICROBIOLOGY: Recent Results (from the past 240 hour(s))  Culture, blood (Routine x 2)     Status: Abnormal (Preliminary result)   Collection Time: 12/14/20 12:45 PM   Specimen: BLOOD RIGHT FOREARM  Result Value Ref Range Status   Specimen Description BLOOD RIGHT FOREARM  Final   Special Requests   Final    BOTTLES DRAWN AEROBIC AND ANAEROBIC Blood Culture adequate volume   Culture  Setup Time   Final    GRAM POSITIVE COCCI IN BOTH AEROBIC AND ANAEROBIC BOTTLES CRITICAL RESULT CALLED TO, READ BACK BY AND VERIFIED WITH: PHARMD JAMES LEDFORD 12/15/2020@6 :07 BY TW    Culture (A)  Final    ENTEROCOCCUS FAECALIS SUSCEPTIBILITIES TO FOLLOW Performed at [facility], [place] [address], [place], [place] [address]    Report Status PENDING  Incomplete  Blood Culture ID Panel (Reflexed)     Status: Abnormal   Collection Time: 12/14/20 12:45 PM  Result Value Ref Range Status   Enterococcus faecalis DETECTED (A) NOT DETECTED Final    Comment: CRITICAL RESULT CALLED TO, READ BACK BY AND VERIFIED WITH: PHARMD JAMES LEDFORD 12/15/2020@6 :07 BY TW    Enterococcus Faecium NOT DETECTED NOT DETECTED Final   Listeria monocytogenes NOT DETECTED NOT DETECTED Final   Staphylococcus species NOT DETECTED NOT DETECTED Final   Staphylococcus aureus (BCID) NOT DETECTED NOT DETECTED Final   Staphylococcus epidermidis NOT DETECTED NOT DETECTED Final   Staphylococcus lugdunensis NOT DETECTED NOT DETECTED Final   Streptococcus species NOT DETECTED NOT DETECTED Final  Streptococcus agalactiae NOT DETECTED NOT  DETECTED Final   Streptococcus pneumoniae NOT DETECTED NOT DETECTED Final   Streptococcus pyogenes NOT DETECTED NOT DETECTED Final   A.calcoaceticus-baumannii NOT DETECTED NOT DETECTED Final   Bacteroides fragilis NOT DETECTED NOT DETECTED Final   Enterobacterales NOT DETECTED NOT DETECTED Final   Enterobacter cloacae complex NOT DETECTED NOT DETECTED Final   Escherichia coli NOT DETECTED NOT DETECTED Final   Klebsiella aerogenes NOT DETECTED NOT DETECTED Final   Klebsiella oxytoca NOT DETECTED NOT DETECTED Final   Klebsiella pneumoniae NOT DETECTED NOT DETECTED Final   Proteus species NOT DETECTED NOT DETECTED Final   Salmonella species NOT DETECTED NOT DETECTED Final   Serratia marcescens NOT DETECTED NOT DETECTED Final   Haemophilus influenzae NOT DETECTED NOT DETECTED Final   Neisseria meningitidis NOT DETECTED NOT DETECTED Final   Pseudomonas aeruginosa NOT DETECTED NOT DETECTED Final   Stenotrophomonas maltophilia NOT DETECTED NOT DETECTED Final   Candida albicans NOT DETECTED NOT DETECTED Final   Candida auris NOT DETECTED NOT DETECTED Final   Candida glabrata NOT DETECTED NOT DETECTED Final   Candida krusei NOT DETECTED NOT DETECTED Final   Candida parapsilosis NOT DETECTED NOT DETECTED Final   Candida tropicalis NOT DETECTED NOT DETECTED Final   Cryptococcus neoformans/gattii NOT DETECTED NOT DETECTED Final   Vancomycin resistance NOT DETECTED NOT DETECTED Final    Comment: Performed at [facility], [address], [place], [place] [address]  Resp Panel by RT-PCR (Flu A&B, Covid) Nasopharyngeal Swab     Status: None   Collection Time: 12/14/20  3:41 PM   Specimen: Nasopharyngeal Swab; Nasopharyngeal(NP) swabs in vial transport medium  Result Value Ref Range Status   SARS Coronavirus 2 by RT PCR NEGATIVE NEGATIVE Final    Comment: (NOTE) SARS-CoV-2 target nucleic acids are NOT DETECTED.  The SARS-CoV-2 RNA is generally detectable in upper respiratory specimens  during the acute phase of infection. The lowest concentration of SARS-CoV-2 viral copies this assay can detect is 138 copies/mL. A negative result does not preclude SARS-Cov-2 infection and should not be used as the sole basis for treatment or other patient management decisions. A negative result may occur with  improper specimen collection/handling, submission of specimen other than nasopharyngeal swab, presence of viral mutation(s) within the areas targeted by this assay, and inadequate number of viral copies(<138 copies/mL). A negative result must be combined with clinical observations, patient history, and epidemiological information. The expected result is Negative.  Fact Sheet for Patients:  EntrepreneurPulse.com.au  Fact Sheet for Healthcare Providers:  IncredibleEmployment.be  This test is no t yet approved or cleared by the [place] FDA and  has been authorized for detection and/or diagnosis of SARS-CoV-2 by FDA under an Emergency Use Authorization (EUA). This EUA will remain  in effect (meaning this test can be used) for the duration of the COVID-19 declaration under Section 564(b)(1) of the Act, 21 U.S.C.section 360bbb-3(b)(1), unless the authorization is terminated  or revoked sooner.       Influenza A by PCR NEGATIVE NEGATIVE Final   Influenza B by PCR NEGATIVE NEGATIVE Final    Comment: (NOTE) The Xpert Xpress SARS-CoV-2/FLU/RSV plus assay is intended as an aid in the diagnosis of influenza from Nasopharyngeal swab specimens and should not be used as a sole basis for treatment. Nasal washings and aspirates are unacceptable for Xpert Xpress SARS-CoV-2/FLU/RSV testing.  Fact Sheet for Patients: EntrepreneurPulse.com.au  Fact Sheet for Healthcare Providers: IncredibleEmployment.be  This test is not yet approved or  cleared by the [place] and has been authorized for detection  and/or diagnosis of SARS-CoV-2 by FDA under an Emergency Use Authorization (EUA). This EUA will remain in effect (meaning this test can be used) for the duration of the COVID-19 declaration under Section 564(b)(1) of the Act, 21 U.S.C. section 360bbb-3(b)(1), unless the authorization is terminated or revoked.  Performed at [facility], [place] [address], [place], [place] [address]   Culture, blood (Routine x 2)     Status: None (Preliminary result)   Collection Time: 12/14/20  4:39 PM   Specimen: BLOOD  Result Value Ref Range Status   Specimen Description BLOOD RIGHT HAND  Final   Special Requests   Final    BOTTLES DRAWN AEROBIC AND ANAEROBIC Blood Culture results may not be optimal due to an inadequate volume of blood received in culture bottles   Culture  Setup Time   Final    GRAM POSITIVE COCCI IN CHAINS IN BOTH AEROBIC AND ANAEROBIC BOTTLES CRITICAL VALUE NOTED.  VALUE IS CONSISTENT WITH PREVIOUSLY REPORTED AND CALLED VALUE.    Culture   Final    GRAM POSITIVE COCCI IDENTIFICATION TO FOLLOW Performed at [facility], [place] [address], [place], [place] [address]    Report Status PENDING  Incomplete  Urine Culture     Status: Abnormal (Preliminary result)   Collection Time: 12/14/20  4:43 PM   Specimen: Urine, Clean Catch  Result Value Ref Range Status   Specimen Description URINE, CLEAN CATCH  Final   Special Requests   Final    NONE Performed at [facility], [place] [address], [place], [place] [address]    Culture >=100,000 COLONIES/mL STAPHYLOCOCCUS AUREUS (A)  Final   Report Status PENDING  Incomplete  Culture, blood (routine x 2)     Status: None (Preliminary result)   Collection Time: 12/15/20 11:20 AM   Specimen: BLOOD RIGHT ARM  Result Value Ref Range Status   Specimen Description BLOOD RIGHT ARM  Final   Special Requests   Final    BOTTLES DRAWN AEROBIC AND ANAEROBIC Blood Culture adequate volume   Culture   Final    NO GROWTH < 24  HOURS Performed at [facility], [place] [address] [place], [place] [address]    Report Status PENDING  Incomplete  Culture, blood (routine x 2)     Status: None (Preliminary result)   Collection Time: 12/15/20 11:25 AM   Specimen: BLOOD LEFT ARM  Result Value Ref Range Status   Specimen Description BLOOD LEFT ARM  Final   Special Requests   Final    BOTTLES DRAWN AEROBIC ONLY Blood Culture results may not be optimal due to an inadequate volume of blood received in culture bottles   Culture   Final    NO GROWTH < 24 HOURS Performed at [facility], [name] [address] [place],  [address]    Report Status PENDING  Incomplete  MRSA Next Gen by PCR, Nasal     Status: None   Collection Time: 12/16/20  4:34 AM   Specimen: Nasal Mucosa; Nasal Swab  Result Value Ref Range Status   MRSA by PCR Next Gen NOT DETECTED NOT DETECTED Final    Comment: (NOTE) The GeneXpert MRSA Assay (FDA approved for NASAL specimens only), is one component of a comprehensive MRSA colonization surveillance program. It is not intended to diagnose MRSA infection nor to guide or monitor treatment for MRSA infections. Test  performance is not FDA approved in patients less than 5 years old. Performed at [facility], [place] [address], [place], [place] [address]     RADIOLOGY STUDIES/RESULTS: ECHOCARDIOGRAM COMPLETE  Result Date: 12/15/2020    ECHOCARDIOGRAM REPORT   Patient Name:   Corie AMONTAE NG Date of Exam: 12/15/2020 Medical Rec #:  [id number]     Height:       67.0 in Accession #:    [id number]    Weight:       166.2 lb Date of Birth:  06/16/1940     BSA:          1.869 m Patient Age:    89 years      BP:           120/67 mmHg Patient Gender: M             HR:           98 bpm. Exam Location:  Inpatient Procedure: 2D Echo, Cardiac Doppler, Color Doppler and Intracardiac            Opacification Agent Indications:    Fever R50.9  History:        Patient has prior history of Echocardiogram  examinations, most                 recent 08/07/2020. Stroke; Arrythmias:Atrial Fibrillation. Past                 history of rheumatic fever, acute rheumatic pericarditis.                 Thoracic aortic aneurysm.                 Aortic Valve: 27 mm Carpentier-Edwards prosthetic valve is                 present in the aortic position. Procedure Date: 10/18/2004.  Sonographer:    Darlina Sicilian RDCS Sonographer#2:  Darlina Sicilian RDCS Referring Phys: [id number] [place]  1. The mitral valve has been replaced with an unknown bioprosthesis. No evidence of mitral valve regurgitation. The mean mitral valve gradient is 10.0 mmHg with average heart rate of 101 bpm. PHT 70 ms. Suboptimal LVOT spectral Doppler- VTI deferred. Date of Procedure Date: 10/18/2004.  2. The aortic valve has been repaired/replaced. Aortic valve regurgitation is not visualized. 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 Procedure Date: 10/18/2004. Aortic valve mean gradient measures 16.0 mmHg.  Aortic valve acceleration time measures 74 msec.  3. Left ventricular ejection fraction, by estimation, is 65 to 70%. The left ventricle has normal function. The left ventricle has no regional wall motion abnormalities. There is mild concentric left ventricular hypertrophy. Left ventricular diastolic parameters are indeterminate.  4. Aortic dilatation noted. 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  5. Right ventricular systolic function is normal. The right ventricular size is normal. Comparison(s): A prior study was performed on 08/07/2020. Prior images reviewed side by side. Stable aortic root and ascending aorta size. Increased mitral valve gradients, but at higher hear rates from prior. FINDINGS  Left Ventricle: Left ventricular ejection fraction, by estimation, is 65 to 70%. The left ventricle has  normal function. The left ventricle has no regional wall motion abnormalities. Definity contrast agent was given IV to delineate the left ventricular  endocardial borders. The left ventricular internal  cavity size was normal in size. There is mild concentric left ventricular hypertrophy. Left ventricular diastolic parameters are indeterminate. Right Ventricle: The right ventricular size is normal. No increase in right ventricular wall thickness. Right ventricular systolic function is normal. Left Atrium: Left atrial size was normal in size. Right Atrium: Right atrial size was normal in size. Pericardium: There is no evidence of pericardial effusion. Mitral Valve: The mitral valve has been repaired/replaced. No evidence of mitral valve regurgitation. There is a 29 mm Carpentier-Edwards prosthetic present in the mitral position. MV peak gradient, 17.1 mmHg. The mean mitral valve gradient is 10.0 mmHg with average heart rate of 101 bpm. Tricuspid Valve: The tricuspid valve is grossly normal. Tricuspid valve regurgitation is mild . No evidence of tricuspid stenosis. Aortic Valve: The aortic valve has been repaired/replaced. Aortic valve regurgitation is not visualized. Aortic valve mean gradient measures 16.0 mmHg. Aortic valve peak gradient measures 24.4 mmHg. Aortic valve area, by VTI measures 1.56 cm. There is a  27 mm Carpentier-Edwards prosthetic valve present in the aortic position. Procedure Date: 10/18/2004. Pulmonic Valve: The pulmonic valve was normal in structure. Pulmonic valve regurgitation is mild. No evidence of pulmonic stenosis. Aorta: The ascending aorta was not well visualized and aortic dilatation noted. 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 IAS/Shunts: The atrial septum is grossly normal.  LEFT VENTRICLE PLAX 2D LVIDd:         4.80 cm LVIDs:         3.50 cm LV PW:         1.30 cm LV IVS:        1.30 cm  LVOT diam:     3.25 cm LV SV:         62 LV SV Index:   33 LVOT Area:     8.30 cm  LV Volumes (MOD) LV vol d, MOD A2C: 81.2 ml LV vol d, MOD A4C: 94.4 ml LV vol s, MOD A2C: 18.0 ml LV vol s, MOD A4C: 30.1 ml LV SV MOD A2C:     63.2 ml LV SV MOD A4C:     94.4 ml LV SV MOD BP:      63.9 ml RIGHT VENTRICLE RV S prime:     10.40 cm/s LEFT ATRIUM             Index        RIGHT ATRIUM          Index LA diam:        4.50 cm 2.41 cm/m   RA Area:     8.74 cm LA Vol (A2C):   58.1 ml 31.08 ml/m  RA Volume:   12.60 ml 6.74 ml/m LA Vol (A4C):   56.9 ml 30.44 ml/m LA Biplane Vol: 58.9 ml 31.51 ml/m  AORTIC VALVE AV Area (Vmax):    5.29 cm AV Area (Vmean):   1.55 cm AV Area (VTI):     1.56 cm AV Vmax:           247.00 cm/s AV Vmean:          190.500 cm/s AV VTI:            0.401 m AV Peak Grad:      24.4 mmHg AV Mean Grad:      16.0 mmHg LVOT Vmax:         157.40 cm/s LVOT Vmean:  35.700 cm/s LVOT VTI:          0.075 m LVOT/AV VTI ratio: 0.19  AORTA Ao Asc diam: 4.90 cm MITRAL VALVE                TRICUSPID VALVE MV Area (PHT): 3.16 cm     TR Peak grad:   26.4 mmHg MV Area VTI:   1.39 cm     TR Vmax:        257.00 cm/s MV Peak grad:  17.1 mmHg MV Mean grad:  10.0 mmHg    SHUNTS MV Vmax:       2.07 m/s     Systemic VTI:  0.08 m MV Vmean:      151.0 cm/s   Systemic Diam: 3.25 cm MV Decel Time: 240 msec MV E velocity: 195.00 cm/s Rudean Haskell MD Electronically signed by Rudean Haskell MD Signature Date/Time: 12/15/2020/12:49:20 PM    Final      LOS: 1 day   Oren Binet, MD  [facility]    To contact the attending provider between 7A-7P or the covering provider during after hours 7P-7A, please log into the web site [email or web address] and access using universal [place] password for that web site. If you do not have the password, please call the hospital operator.  12/16/2020, 1:42 PM

## 2020-12-16 NOTE — Telephone Encounter (Signed)
Patient is back in hospital with infection, wife asked that if you have a min to stop and say hello, they would love to see you.

## 2020-12-16 NOTE — Progress Notes (Signed)
Cardiology Team Note:   No new recommendations today. TEE planned for Friday.  Please call with questions.   Lauree Chandler 12/16/2020 9:39 AM

## 2020-12-16 NOTE — Progress Notes (Signed)
Speaking with family on speaker phone they expressed desire upon discharge not to go back to [facility] but would prefer either, in order of preference,1) [facility] 2) [facility] 3) [facility].

## 2020-12-16 NOTE — Progress Notes (Signed)
K+ 3.6 today while on Tikosyn. Ok to give KCL 70meq x2 per Dr. Sloan Leiter.  Onnie Boer, PharmD, BCIDP, AAHIVP, CPP Infectious Disease Pharmacist 12/16/2020 11:30 AM

## 2020-12-16 NOTE — Progress Notes (Addendum)
Initial Nutrition Assessment  DOCUMENTATION CODES:   Severe malnutrition in context of chronic illness  INTERVENTION:   Multivitamin w/ minerals daily  Hormel Shake TID, each supplement provides 520 kcal and 22 grams of protein  Encourage good PO intake  TF recommendations if pt proceeds with Cortrak placement Osmolite 1.2 @ 75 mL/hr (1800 mL/day) 45 mL ProSource TF - daily 125 mL water flushes q4h  Provides 220 kcal, 111 gm PRO, 1476 mL free water (2226 mL total water including flushes) Start at a rate of 35 mL/hr and increase by 10 mL q8h until goal is met  NUTRITION DIAGNOSIS:   Severe Malnutrition related to chronic illness (CHF, Stroke) as evidenced by percent weight loss, moderate fat depletion, severe muscle depletion.  GOAL:   Patient will meet greater than or equal to 90% of their needs  MONITOR:   Weight trends, PO intake, Diet advancement, Supplement acceptance, Skin  REASON FOR ASSESSMENT:   Malnutrition Screening Tool   ASSESSMENT:   80 y.o. male presented to the ED with lethargy and change in mental status, pt coming from SNF. PMH includes stroke in May and June of 2022, CHF, diverticulosis, HTN, and T2DM. PT admitted with UTI, dehydration, and Stage II pressure injury.   Pt nutrition related information obtained from wife at bedside. Per wife, pts intake has significantly decreased over the past month. Per wife, chicken and eggs are too dry. Pt typical intake prior to his strokes include Breakfast: bacon and eggs, Lunch: "something light, unsure he made his own", Dinner: chix or beef or fish w/ vegetables.   Pt wife, believes that his UBW is around 190#, but was not exactly sure. Per EMR, pt has had 12% weight loss in 3 months, this is clinically significant within the time frame. Pt currently at SNF and participation in therapy has decreased per wife.   Per MD note, discussed Cortrak placement with pt and wife. Not agreeable to placement at this time,  will reassess on Friday. Will provide TF recommendations in place that Cortrak is placed.  Discussed ONS with wife, pt currently getting Glucerna due to being pre-diabetes and was having instances of high CBGs during last admission. Discussed transitioning pt to Hormel shake that is already nectar thick and provide more calories and protein.   Medications reviewed and include: IV antibiotics, Potassium Chloride Labs reviewed: Sodium 143, BUN 26   Admission Weight: 75.4 kg  NUTRITION - FOCUSED PHYSICAL EXAM:  Flowsheet Row Most Recent Value  Orbital Region Moderate depletion  Upper Arm Region Moderate depletion  Thoracic and Lumbar Region Moderate depletion  Buccal Region Moderate depletion  Temple Region Moderate depletion  Clavicle Bone Region Moderate depletion  Clavicle and Acromion Bone Region Moderate depletion  Scapular Bone Region Moderate depletion  Dorsal Hand Moderate depletion  Patellar Region Severe depletion  Anterior Thigh Region Severe depletion  Posterior Calf Region Severe depletion  Edema (RD Assessment) None  Hair Reviewed  Eyes Reviewed  Mouth Reviewed  Skin Reviewed  Nails Reviewed       Diet Order:   Diet Order             DIET DYS 3 Room service appropriate? Yes; Fluid consistency: Nectar Thick  Diet effective now                   EDUCATION NEEDS:   No education needs have been identified at this time  Skin:  Skin Assessment: Skin Integrity Issues: Skin Integrity Issues:: Stage I, Stage  II Stage I: Buttocks Stage II: L Elbow  Last BM:  12/15/2020 - Type 6  Height:   Ht Readings from Last 1 Encounters:  12/14/20 $RemoveB'5\' 7"'htsyLYGm$  (1.702 m)    Weight:   Wt Readings from Last 1 Encounters:  12/14/20 75.4 kg    Ideal Body Weight:  67.3 kg  BMI:  Body mass index is 26.03 kg/m.  Estimated Nutritional Needs:   Kcal:  2100-2300  Protein:  105-120 grams  Fluid:  >/= 2.1 L    Myda Detwiler BS, PLDN Clinical Dietitian See  [facility] for contact information.

## 2020-12-16 NOTE — TOC Initial Note (Addendum)
Transition of Care [facility]) - Initial/Assessment Note    Patient Details  Name: [name] [name] MRN: [id number] Date of Birth: October 14, 1940  [facility]) CM/SW Contact:    Benard Halsted, LCSW Phone Number: 12/16/2020, 4:06 PM  Clinical Narrative:                 Patient resides at [facility]. Per Butch Penny there, he will return to the rehab side. CSW will continue to follow for discharge needs.   Expected Discharge Plan: Skilled Nursing Facility Barriers to Discharge: Continued Medical Work up   Patient Goals and CMS Choice Patient states their goals for this hospitalization and ongoing recovery are:: Return to SNF CMS Medicare.gov Compare Post Acute Care list provided to:: Patient Represenative (must comment) Choice offered to / list presented to : Spouse  Expected Discharge Plan and Services Expected Discharge Plan: [place] In-house Referral: Clinical Social Work   Post Acute Care Choice: [place] Living arrangements for the past 2 months: [place]                                      Prior Living Arrangements/Services Living arrangements for the past 2 months: [place] Lives with:: Facility Resident Patient language and need for interpreter reviewed:: Yes Do you feel safe going back to the place where you live?: Yes      Need for Family Participation in Patient Care: Yes (Comment) Care giver support system in place?: Yes (comment)   Criminal Activity/Legal Involvement Pertinent to Current Situation/Hospitalization: No - Comment as needed  Activities of Daily Living   ADL Screening (condition at time of admission) Patient's cognitive ability adequate to safely complete daily activities?: No Is the patient deaf or have difficulty hearing?: No Does the patient have difficulty seeing, even when wearing glasses/contacts?: No Does the patient have difficulty concentrating, remembering, or  making decisions?: Yes Patient able to express need for assistance with ADLs?: No Does the patient have difficulty dressing or bathing?: Yes Independently performs ADLs?: No Communication: Dependent Is this a change from baseline?: Pre-admission baseline Dressing (OT): Dependent Is this a change from baseline?: Pre-admission baseline Grooming: Dependent Is this a change from baseline?: Pre-admission baseline Feeding: Dependent Is this a change from baseline?: Pre-admission baseline Bathing: Dependent Is this a change from baseline?: Pre-admission baseline Toileting: Dependent Is this a change from baseline?: Pre-admission baseline In/Out Bed: Dependent Is this a change from baseline?: Pre-admission baseline Walks in Home: Dependent Is this a change from baseline?: Pre-admission baseline Does the patient have difficulty walking or climbing stairs?: Yes Weakness of Legs: Both Weakness of Arms/Hands: Both  Permission Sought/Granted Permission sought to share information with : Facility Sport and exercise psychologist, Family Supports Permission granted to share information with : No  Share Information with NAME: Herbert Pun  Permission granted to share info w AGENCY: [facility]  Permission granted to share info w Relationship: Spouse  Permission granted to share info w Contact Information: [phone]  Emotional Assessment Appearance:: Appears stated age Attitude/Demeanor/Rapport: Unable to Assess Affect (typically observed): Unable to Assess Orientation: :  (Disoriented x4) Alcohol / Substance Use: Not Applicable Psych Involvement: No (comment)  Admission diagnosis:  Bacteremia [R78.81] Acute hypernatremia [E87.0] Somnolence [R40.0] Hypernatremia [E87.0] Patient Active Problem List   Diagnosis Date Noted   Bacteremia 12/15/2020   History of aortic valve replacement    History of mitral  valve replacement    Pressure injury of skin 12/14/2020   Hypernatremia 12/14/2020   Somnolence     Diabetes mellitus type II, controlled ([place]) 10/15/2020   Oropharyngeal dysphagia    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[place])    Acute ischemic right MCA stroke ([place]) 08/15/2020   CVA (cerebral vascular accident) ([place]) 08/07/2020   Middle cerebral artery embolism, 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   Subdural hemorrhage following injury 07/31/2020   S/P craniotomy 07/28/2020   Subdural hematoma 07/27/2020   Vertigo 08/30/2018   S/P right THA, 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   Long term (current) use of anticoagulants 01/05/2018   History of food allergy 12/11/2017   Allergic reaction 12/11/2017   Emesis 12/11/2017   Visit for monitoring Tikosyn therapy 06/06/2017   Internal hemorrhoid, bleeding 05/19/2016   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   Aortic valve disorder 07/09/2015   Long term current use of anticoagulant therapy 01/19/2015   Atrial fibrillation, chronic ([place]) 01/20/2014   Elevated PSA 12/23/2013   Prediabetes 12/19/2013   Benign prostatic hyperplasia with urinary obstruction 10/29/2013   left frontal cerebral infarct secondary to afib 08/05/2013   Atrial flutter ([place]) 05/17/2013   Essential hypertension 06/19/2012   Routine health maintenance 04/26/2011   Hyperlipidemia with target LDL less than 130 07/07/2008   History of colonic polyps 06/16/2008   ERECTILE DYSFUNCTION 09/25/2007   PCP:  [name], MD Pharmacy:   [facility] [address] [place] [address] Phone: [phone] Fax: [facility] [place], [address] [place] [address] Phone: [phone] Fax: [phone]  [facility] [place] [address] Phone: [phone] Fax:  [phone]  [place], [place] - [address] [place] [address] Phone: [phone] Fax: [phone]     Social Determinants of Health (SDOH) Interventions    Readmission Risk Interventions No flowsheet data found.

## 2020-12-16 NOTE — Progress Notes (Signed)
Id brief note    Tee planned for Friday Repeat bcx cooking No sign of sepsis otherwise   A/p Av/mv replacement hx Enterococcus faecalis bacteremia    -f/u bcx -continue ampicillin for now -ceftriaxone to be added if tee confirms vegetation of valves

## 2020-12-17 ENCOUNTER — Inpatient Hospital Stay (HOSPITAL_COMMUNITY): Payer: Medicare Other

## 2020-12-17 DIAGNOSIS — R569 Unspecified convulsions: Secondary | ICD-10-CM | POA: Diagnosis not present

## 2020-12-17 DIAGNOSIS — Z8673 Personal history of transient ischemic attack (TIA), and cerebral infarction without residual deficits: Secondary | ICD-10-CM | POA: Diagnosis not present

## 2020-12-17 DIAGNOSIS — R7881 Bacteremia: Secondary | ICD-10-CM | POA: Diagnosis not present

## 2020-12-17 DIAGNOSIS — R4182 Altered mental status, unspecified: Secondary | ICD-10-CM

## 2020-12-17 DIAGNOSIS — Z9889 Other specified postprocedural states: Secondary | ICD-10-CM

## 2020-12-17 DIAGNOSIS — Z952 Presence of prosthetic heart valve: Secondary | ICD-10-CM | POA: Diagnosis not present

## 2020-12-17 LAB — CULTURE, BLOOD (ROUTINE X 2): Special Requests: ADEQUATE

## 2020-12-17 LAB — URINE CULTURE: Culture: >=100000 — AB

## 2020-12-17 LAB — GLUCOSE, CAPILLARY
Glucose-Capillary: 117 mg/dL — ABNORMAL HIGH (ref 70–99)
Glucose-Capillary: 113 mg/dL — ABNORMAL HIGH (ref 70–99)
Glucose-Capillary: 133 mg/dL — ABNORMAL HIGH (ref 70–99)

## 2020-12-17 LAB — BASIC METABOLIC PANEL
Anion gap: 5 (ref 5–15)
BUN: 23 mg/dL (ref 8–23)
CO2: 22 mmol/L (ref 22–32)
Calcium: 7.7 mg/dL — ABNORMAL LOW (ref 8.9–10.3)
Chloride: 117 mmol/L — ABNORMAL HIGH (ref 98–111)
Creatinine, Ser: 0.79 mg/dL (ref 0.61–1.24)
GFR, Estimated: >60 mL/min (ref >60)
Glucose, Bld: 208 mg/dL — ABNORMAL HIGH (ref 70–99)
Potassium: 3.9 mmol/L (ref 3.5–5.1)
Sodium: 144 mmol/L (ref 135–145)

## 2020-12-17 LAB — HEMOGLOBIN A1C
Hgb A1c MFr Bld: 7.3 % — ABNORMAL HIGH (ref 4.8–5.6)
Mean Plasma Glucose: 162.81 mg/dL

## 2020-12-17 LAB — MAGNESIUM: Magnesium: 2.1 mg/dL (ref 1.7–2.4)

## 2020-12-17 MED ORDER — POTASSIUM CHLORIDE 10 MEQ/100ML IV SOLN
10.0000 meq | INTRAVENOUS | Status: AC
  Administered 2020-12-17 (×2): 10 meq via INTRAVENOUS
  Filled 2020-12-17: qty 100

## 2020-12-17 MED ORDER — INSULIN ASPART 100 UNIT/ML IJ SOLN
0.0000 [IU] | Freq: Three times a day (TID) | INTRAMUSCULAR | Status: DC
  Administered 2020-12-17 – 2020-12-19 (×2): 1 [IU] via SUBCUTANEOUS
  Administered 2020-12-19 – 2020-12-20 (×4): 2 [IU] via SUBCUTANEOUS
  Administered 2020-12-20: 3 [IU] via SUBCUTANEOUS
  Administered 2020-12-21 – 2020-12-23 (×7): 2 [IU] via SUBCUTANEOUS
  Administered 2020-12-23: 3 [IU] via SUBCUTANEOUS
  Administered 2020-12-23 – 2020-12-24 (×4): 2 [IU] via SUBCUTANEOUS
  Administered 2020-12-25: 3 [IU] via SUBCUTANEOUS
  Administered 2020-12-25 – 2020-12-26 (×4): 2 [IU] via SUBCUTANEOUS
  Administered 2020-12-27 – 2020-12-28 (×4): 1 [IU] via SUBCUTANEOUS
  Administered 2020-12-28: 2 [IU] via SUBCUTANEOUS
  Administered 2020-12-28 – 2020-12-29 (×3): 1 [IU] via SUBCUTANEOUS
  Administered 2020-12-30: 2 [IU] via SUBCUTANEOUS
  Administered 2020-12-31 – 2021-01-01 (×2): 1 [IU] via SUBCUTANEOUS
  Administered 2021-01-02: 3 [IU] via SUBCUTANEOUS
  Administered 2021-01-04: 1 [IU] via SUBCUTANEOUS

## 2020-12-17 MED ORDER — POTASSIUM CHLORIDE 20 MEQ PO PACK
40.0000 meq | PACK | Freq: Once | ORAL | Status: AC
  Administered 2020-12-17: 40 meq via ORAL
  Filled 2020-12-17: qty 2

## 2020-12-17 NOTE — Progress Notes (Addendum)
PROGRESS NOTE        PATIENT DETAILS Name: [name] [name] Age: 80 y.o. Sex: male Date of Birth: 24-Mar-1940 Admit Date: 12/14/2020 Admitting Physician [name] Shell, MD YOM:AYOKH, [name] Right, MD  Brief Narrative: Patient is a 80 y.o. 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  See below for further details.    Subjective: Briefly awakes and then goes back to sleep.  Per spouse-yesterday-he was much more awake/alert-conversing/interacting with family.  Per spouse-yesterday he ate all his meals including nutritional supplements.  Objective: Vitals: Blood pressure 121/78, pulse 71, temperature 98.9 F (37.2 C), temperature source Oral, resp. rate 19, height 5\' 7"  (1.702 m), weight 75.4 kg, SpO2 92 %.   Exam: Gen Exam: Briefly awakes-but then goes back to sleep. HEENT:atraumatic, normocephalic Chest: B/L clear to auscultation anteriorly CVS:S1S2 regular Abdomen:soft non tender, non distended Extremities:no edema Neurology: Continues to have significant left-sided weakness. Skin: no rash   Pertinent Labs/Radiology: Na: 144 K: 3.9  creatinine: 0.79  10/10>>Blood culture: Enterococcus 10/10>> urine culture: Staff aureus 10/11>> blood culture: 1/2-gram-positive cocci (?  Contamination) 10/13>> blood culture: Pending  10/10>> CT head: Small [name] subdural hematoma 10/11>> TTE: EF-65-70%, no obvious vegetation.  Assessment/Plan: Acute metabolic encephalopathy: Due to hyponatremia/enterococcal bacteremia-Per family-patient  was much more awake and alert yesterday-he is somewhat lethargic/sleepy today.  Per family-not unusual for patient to have variation/fluctuation in mental status.  Check EEG today-we will reassess later this afternoon to see if further imaging/work-up is required.    Sepsis secondary to enterococcal bacteremia: Sepsis physiology improving-continues to have lingering encephalopathy-on IV ampicillin-ID following-TEE planned for 10/14.  TTE without any obvious vegetations.  ID following.    Hypernatremia: Resolved-continue to encourage oral intake-on gentle hydration with IVF.  Watch closely.  PAF: Continue Tikosyn-remains on Eliquis.  History of seizure disorder: On Keppra-due to fluctuating encephalopathy-getting EEG today.  Recent SDH May 2022: From signout from prior MD-Dr. 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  Continues to have significant left-sided hemiparesis.  On Eliquis.  History of rheumatic heart disease-s/p remote AVR/MVR (bioprosthetic valve)  Severe failure to thrive syndrome/frailty: Worsened by acute illness-continues to have waxing/waning encephalopathy and erratic oral intake.  Per family-patient was able to consume a significant quantity of his meals yesterday-and took all of his nutritional supplements.  Have had significant discussions with family-plan is to continue supportive care-and reassess on 10/14 to see if family wants to proceed with short-term NG tube feedings.  DNR in place-family aware of tenuous overall clinical situation.  Nutrition Status: Nutrition Problem: Severe Malnutrition Etiology: chronic illness (CHF, Stroke) Signs/Symptoms: percent weight loss, moderate fat depletion, severe muscle depletion Percent weight loss: 12  % Interventions: MVI, Hormel Shake    Procedures: None Consults: None DVT Prophylaxis: Eliquis Code Status:DNR Family Communication: Spouse-[name] [name] at [employer] over the phone on 10/13.  Time spent: 35 minutes-Greater than 50% of this time was spent in counseling,  explanation of diagnosis, planning of further management, and coordination of care.  Diet: Diet Order             DIET DYS 3 Room service appropriate? No; Fluid consistency: Nectar Thick  Diet effective now                      Disposition Plan: Status is: Inpatient  Remains inpatient appropriate because:Inpatient level of care appropriate due to severity of illness  Dispo: The patient is from: SNF              Anticipated d/c is to: SNF              Patient currently is not medically stable to d/c.   Difficult to place patient No    Barriers to Discharge: Lingering encephalopathy-enterococcal bacteremia-TEE on 10/14-remains on IV ampicillin.  Antimicrobial agents: Anti-infectives (From admission, onward)    Start     Dose/Rate Route Frequency Ordered Stop   12/15/20 1700  cefTRIAXone (ROCEPHIN) 1 g in sodium chloride 0.9 % 100 mL IVPB  Status:  Discontinued        1 g 200 mL/hr over 30 Minutes Intravenous Every 24 hours 12/14/20 1648 12/15/20 0624   12/15/20 0630  ampicillin (OMNIPEN) 2 g in sodium chloride 0.9 % 100 mL IVPB        2 g 300 mL/hr over 20 Minutes Intravenous Every 6 hours 12/15/20 0624     12/15/20 0630  gentamicin (GARAMYCIN) IVPB 80 mg        80 mg 100 mL/hr over 30 Minutes Intravenous  Once 12/15/20 0628 12/15/20 0732   12/14/20 1645  cefTRIAXone (ROCEPHIN) 1 g in sodium chloride 0.9 % 100 mL IVPB        1 g 200 mL/hr over 30 Minutes Intravenous  Once 12/14/20 1642 12/14/20 1830        MEDICATIONS: Scheduled Meds:  apixaban  5 mg Oral BID   dofetilide  500 mcg Oral BID   finasteride  5 mg Oral Daily   insulin aspart  0-9 Units Subcutaneous TID WC   mouth rinse  15  mL Mouth Rinse BID   multivitamin with minerals  1 tablet Oral Daily   sodium chloride flush  3 mL Intravenous Q12H   tamsulosin  0.4 mg Oral Daily   Continuous Infusions:  sodium chloride 50 mL/hr at 12/17/20 0900   sodium chloride     ampicillin (OMNIPEN) IV Stopped (12/17/20 4665)   levETIRAcetam 500 mg (12/17/20 1034)   potassium chloride 10 mEq (12/17/20 1101)   PRN Meds:.sodium chloride, sodium chloride flush   I have personally reviewed following labs and imaging studies  LABORATORY DATA: CBC: Recent Labs  Lab 12/14/20 1245 12/14/20 1504 12/15/20 0508 12/16/20 0130  WBC 12.0*  --  11.1* 10.7*  NEUTROABS 9.3*  --   --  7.7  HGB 11.7* 18.7* 9.8* 9.9*  HCT 36.8* 55.0* 31.6* 30.7*  MCV 90.0  --  92.4 88.0  PLT 119*  --  108* 106*     Basic Metabolic Panel: Recent Labs  Lab 12/13/20 0000 12/14/20 1245 12/14/20 1504 12/14/20 2056 12/15/20 0508 12/16/20 0130 12/17/20 0210  NA 150* 149* 154*  --  148* 146* 144  K 3.8 4.1 4.3  --  3.8 3.6 3.9  CL 120* 119*  --   --  119* 117* 117*  CO2 22 22  --   --  23 22 22   GLUCOSE  --  204*  --   --  144* 145* 208*  BUN 29* 32*  --   --  25* 26* 23  CREATININE 1.0 1.18  --   --  0.91 0.84 0.79  CALCIUM 8.3* 8.6*  --   --  8.1* 8.1* 7.7*  MG  --   --   --  2.3 2.2 2.1 2.1     GFR: Estimated Creatinine Clearance: 71.1 mL/min (by C-G formula based on SCr of 0.79 mg/dL).  Liver Function Tests: Recent Labs  Lab 12/11/20 0000 12/12/20 0000 12/14/20 1245 12/15/20 0508 12/16/20 0130  AST 39 47* 63* 48* 56*  ALT 62* 78* 92* 75* 76*  ALKPHOS 93 82 68 65 66  BILITOT  --   --  0.5 0.7 0.5  PROT  --   --  6.8 5.8* 5.8*  ALBUMIN 2.8* 2.9* 2.3* 2.0* 1.9*    No results for input(s): LIPASE, AMYLASE in the last 168 hours. Recent Labs  Lab 12/14/20 1639  AMMONIA 25     Coagulation Profile: Recent Labs  Lab 12/14/20 1245 12/15/20 0508  INR 1.5* 1.5*     Cardiac Enzymes: No results for input(s): CKTOTAL,  CKMB, CKMBINDEX, TROPONINI in the last 168 hours.  BNP (last 3 results) No results for input(s): PROBNP in the last 8760 hours.  Lipid Profile: No results for input(s): CHOL, HDL, LDLCALC, TRIG, CHOLHDL, LDLDIRECT in the last 72 hours.  Thyroid Function Tests: Recent Labs    12/14/20 2056  TSH 1.567     Anemia Panel: No results for input(s): VITAMINB12, FOLATE, FERRITIN, TIBC, IRON, RETICCTPCT in the last 72 hours.  Urine analysis:    Component Value Date/Time   COLORURINE YELLOW 12/14/2020 1600   APPEARANCEUR CLOUDY (A) 12/14/2020 1600   LABSPEC 1.020 12/14/2020 1600   PHURINE 6.0 12/14/2020 1600   GLUCOSEU NEGATIVE 12/14/2020 1600   HGBUR LARGE (A) 12/14/2020 1600   BILIRUBINUR NEGATIVE 12/14/2020 1600   BILIRUBINUR negative 11/22/2013 0848   KETONESUR NEGATIVE 12/14/2020 1600   PROTEINUR NEGATIVE 12/14/2020 1600   UROBILINOGEN 0.2 11/22/2013 0848   UROBILINOGEN 1.0 08/05/2013 2207   NITRITE POSITIVE (A) 12/14/2020 1600   LEUKOCYTESUR LARGE (A) 12/14/2020 1600    Sepsis Labs: Lactic Acid, Venous    Component Value Date/Time   LATICACIDVEN 1.8 12/14/2020 2056    MICROBIOLOGY: Recent Results (from the past 240 hour(s))  Culture, blood (Routine x 2)     Status: Abnormal   Collection Time: 12/14/20 12:45 PM   Specimen: BLOOD [name] FOREARM  Result Value Ref Range Status   Specimen Description BLOOD [name] FOREARM  Final   Special Requests   Final    BOTTLES DRAWN AEROBIC AND ANAEROBIC Blood Culture adequate volume   Culture  Setup Time   Final    GRAM POSITIVE COCCI IN BOTH AEROBIC AND ANAEROBIC BOTTLES CRITICAL RESULT CALLED TO, READ BACK BY AND VERIFIED WITH: PHARMD JAMES LEDFORD 12/15/2020@6 :07 BY TW Performed at [facility], [place] [address], [place], [place] [address]    Culture ENTEROCOCCUS FAECALIS (A)  Final   Report Status 12/17/2020 FINAL  Final   Organism ID, Bacteria ENTEROCOCCUS FAECALIS  Final      Susceptibility   Enterococcus faecalis  - MIC*    AMPICILLIN <=2 SENSITIVE Sensitive     VANCOMYCIN 2 SENSITIVE Sensitive     GENTAMICIN SYNERGY SENSITIVE Sensitive     * ENTEROCOCCUS FAECALIS  Blood Culture ID Panel (Reflexed)     Status: Abnormal  Collection Time: 12/14/20 12:45 PM  Result Value Ref Range Status   Enterococcus faecalis DETECTED (A) NOT DETECTED Final    Comment: CRITICAL RESULT CALLED TO, READ BACK BY AND VERIFIED WITH: PHARMD JAMES LEDFORD 12/15/2020@6 :07 BY TW    Enterococcus Faecium NOT DETECTED NOT DETECTED Final   Listeria monocytogenes NOT DETECTED NOT DETECTED Final   Staphylococcus species NOT DETECTED NOT DETECTED Final   Staphylococcus aureus (BCID) NOT DETECTED NOT DETECTED Final   Staphylococcus epidermidis NOT DETECTED NOT DETECTED Final   Staphylococcus lugdunensis NOT DETECTED NOT DETECTED Final   Streptococcus species NOT DETECTED NOT DETECTED Final   Streptococcus agalactiae NOT DETECTED NOT DETECTED Final   Streptococcus pneumoniae NOT DETECTED NOT DETECTED Final   Streptococcus pyogenes NOT DETECTED NOT DETECTED Final   A.calcoaceticus-baumannii NOT DETECTED NOT DETECTED Final   Bacteroides fragilis NOT DETECTED NOT DETECTED Final   Enterobacterales NOT DETECTED NOT DETECTED Final   Enterobacter cloacae complex NOT DETECTED NOT DETECTED Final   Escherichia coli NOT DETECTED NOT DETECTED Final   Klebsiella aerogenes NOT DETECTED NOT DETECTED Final   Klebsiella oxytoca NOT DETECTED NOT DETECTED Final   Klebsiella pneumoniae NOT DETECTED NOT DETECTED Final   Proteus species NOT DETECTED NOT DETECTED Final   Salmonella species NOT DETECTED NOT DETECTED Final   Serratia marcescens NOT DETECTED NOT DETECTED Final   Haemophilus influenzae NOT DETECTED NOT DETECTED Final   Neisseria meningitidis NOT DETECTED NOT DETECTED Final   Pseudomonas aeruginosa NOT DETECTED NOT DETECTED Final   Stenotrophomonas maltophilia NOT DETECTED NOT DETECTED Final   Candida albicans NOT DETECTED NOT DETECTED  Final   Candida auris NOT DETECTED NOT DETECTED Final   Candida glabrata NOT DETECTED NOT DETECTED Final   Candida krusei NOT DETECTED NOT DETECTED Final   Candida parapsilosis NOT DETECTED NOT DETECTED Final   Candida tropicalis NOT DETECTED NOT DETECTED Final   Cryptococcus neoformans/gattii NOT DETECTED NOT DETECTED Final   Vancomycin resistance NOT DETECTED NOT DETECTED Final    Comment: Performed at [facility], [address], [place], [place] [address]  Resp Panel by RT-PCR (Flu A&B, Covid) Nasopharyngeal Swab     Status: None   Collection Time: 12/14/20  3:41 PM   Specimen: Nasopharyngeal Swab; Nasopharyngeal(NP) swabs in vial transport medium  Result Value Ref Range Status   SARS Coronavirus 2 by RT PCR NEGATIVE NEGATIVE Final    Comment: (NOTE) SARS-CoV-2 target nucleic acids are NOT DETECTED.  The SARS-CoV-2 RNA is generally detectable in upper respiratory specimens during the acute phase of infection. The lowest concentration of SARS-CoV-2 viral copies this assay can detect is 138 copies/mL. A negative result does not preclude SARS-Cov-2 infection and should not be used as the sole basis for treatment or other patient management decisions. A negative result may occur with  improper specimen collection/handling, submission of specimen other than nasopharyngeal swab, presence of viral mutation(s) within the areas targeted by this assay, and inadequate number of viral copies(<138 copies/mL). A negative result must be combined with clinical observations, patient history, and epidemiological information. The expected result is Negative.  Fact Sheet for Patients:  EntrepreneurPulse.com.au  Fact Sheet for Healthcare Providers:  IncredibleEmployment.be  This test is no t yet approved or cleared by the [place] FDA and  has been authorized for detection and/or diagnosis of SARS-CoV-2 by FDA under an Emergency Use Authorization  (EUA). This EUA will remain  in effect (meaning this test can be used) for the duration of the COVID-19 declaration under  Section 564(b)(1) of the Act, 21 U.S.C.section 360bbb-3(b)(1), unless the authorization is terminated  or revoked sooner.       Influenza A by PCR NEGATIVE NEGATIVE Final   Influenza B by PCR NEGATIVE NEGATIVE Final    Comment: (NOTE) The Xpert Xpress SARS-CoV-2/FLU/RSV plus assay is intended as an aid in the diagnosis of influenza from Nasopharyngeal swab specimens and should not be used as a sole basis for treatment. Nasal washings and aspirates are unacceptable for Xpert Xpress SARS-CoV-2/FLU/RSV testing.  Fact Sheet for Patients: EntrepreneurPulse.com.au  Fact Sheet for Healthcare Providers: IncredibleEmployment.be  This test is not yet approved or cleared by the [place] FDA and has been authorized for detection and/or diagnosis of SARS-CoV-2 by FDA under an Emergency Use Authorization (EUA). This EUA will remain in effect (meaning this test can be used) for the duration of the COVID-19 declaration under Section 564(b)(1) of the Act, 21 U.S.C. section 360bbb-3(b)(1), unless the authorization is terminated or revoked.  Performed at [facility], [place] [address] [place], [place] [address]   Culture, blood (Routine x 2)     Status: Abnormal   Collection Time: 12/14/20  4:39 PM   Specimen: BLOOD  Result Value Ref Range Status   Specimen Description BLOOD [name] HAND  Final   Special Requests   Final    BOTTLES DRAWN AEROBIC AND ANAEROBIC Blood Culture results may not be optimal due to an inadequate volume of blood received in culture bottles   Culture  Setup Time   Final    GRAM POSITIVE COCCI IN CHAINS IN BOTH AEROBIC AND ANAEROBIC BOTTLES CRITICAL VALUE NOTED.  VALUE IS CONSISTENT WITH PREVIOUSLY REPORTED AND CALLED VALUE.    Culture (A)  Final    ENTEROCOCCUS FAECALIS SUSCEPTIBILITIES PERFORMED ON  PREVIOUS CULTURE WITHIN THE LAST 5 DAYS. Performed at [facility], [place] [address], [place], [place] [address]    Report Status 12/17/2020 FINAL  Final  Urine Culture     Status: Abnormal   Collection Time: 12/14/20  4:43 PM   Specimen: Urine, Clean Catch  Result Value Ref Range Status   Specimen Description URINE, CLEAN CATCH  Final   Special Requests   Final    NONE Performed at [facility], [place] [address], [place], [place] [address]    Culture >=100,000 COLONIES/mL STAPHYLOCOCCUS AUREUS (A)  Final   Report Status 12/17/2020 FINAL  Final   Organism ID, Bacteria STAPHYLOCOCCUS AUREUS (A)  Final      Susceptibility   Staphylococcus aureus - MIC*    CIPROFLOXACIN <=0.5 SENSITIVE Sensitive     GENTAMICIN <=0.5 SENSITIVE Sensitive     NITROFURANTOIN 32 SENSITIVE Sensitive     OXACILLIN <=0.25 SENSITIVE Sensitive     TETRACYCLINE <=1 SENSITIVE Sensitive     VANCOMYCIN <=0.5 SENSITIVE Sensitive     TRIMETH/SULFA <=10 SENSITIVE Sensitive     CLINDAMYCIN <=0.25 SENSITIVE Sensitive     RIFAMPIN <=0.5 SENSITIVE Sensitive     Inducible Clindamycin NEGATIVE Sensitive     * >=100,000 COLONIES/mL STAPHYLOCOCCUS AUREUS  Culture, blood (routine x 2)     Status: None (Preliminary result)   Collection Time: 12/15/20 11:20 AM   Specimen: BLOOD [name] ARM  Result Value Ref Range Status   Specimen Description BLOOD [name] ARM  Final   Special Requests   Final    BOTTLES DRAWN AEROBIC AND ANAEROBIC Blood Culture adequate volume   Culture  Setup Time   Final    GRAM  POSITIVE COCCI ANAEROBIC BOTTLE ONLY CRITICAL VALUE NOTED.  VALUE IS CONSISTENT WITH PREVIOUSLY REPORTED AND CALLED VALUE. Performed at [facility], [place] [address] [place], [place] [address]    Culture PENDING  Incomplete   Report Status PENDING  Incomplete  Culture, blood (routine x 2)     Status: None (Preliminary result)   Collection Time: 12/15/20 11:25 AM   Specimen: BLOOD LEFT ARM  Result Value Ref Range  Status   Specimen Description BLOOD LEFT ARM  Final   Special Requests   Final    BOTTLES DRAWN AEROBIC ONLY Blood Culture results may not be optimal due to an inadequate volume of blood received in culture bottles   Culture   Final    NO GROWTH 2 DAYS Performed at [facility], [place] [address] [place], [place] [address]    Report Status PENDING  Incomplete  MRSA Next Gen by PCR, Nasal     Status: None   Collection Time: 12/16/20  4:34 AM   Specimen: Nasal Mucosa; Nasal Swab  Result Value Ref Range Status   MRSA by PCR Next Gen NOT DETECTED NOT DETECTED Final    Comment: (NOTE) The GeneXpert MRSA Assay (FDA approved for NASAL specimens only), is one component of a comprehensive MRSA colonization surveillance program. It is not intended to diagnose MRSA infection nor to guide or monitor treatment for MRSA infections. Test performance is not FDA approved in patients less than 32 years old. Performed at [facility], [place] [address] [place], [place] [address]     RADIOLOGY STUDIES/RESULTS: ECHOCARDIOGRAM COMPLETE  Result Date: 12/15/2020    ECHOCARDIOGRAM REPORT   Patient Name:   Keevon ENRIGUE HASHIMI Date of Exam: 12/15/2020 Medical Rec #:  [id number]     Height:       67.0 in Accession #:    [id number]    Weight:       166.2 lb Date of Birth:  10/24/1940     BSA:          1.869 m Patient Age:    89 years      BP:           120/67 mmHg Patient Gender: M             HR:           98 bpm. Exam Location:  Inpatient Procedure: 2D Echo, Cardiac Doppler, Color Doppler and Intracardiac            Opacification Agent Indications:    Fever R50.9  History:        Patient has prior history of Echocardiogram examinations, most                 recent 08/07/2020. Stroke; Arrythmias:Atrial Fibrillation. Past                 history of rheumatic fever, acute rheumatic pericarditis.                 Thoracic aortic aneurysm.                 Aortic Valve: 27 mm Carpentier-Edwards prosthetic valve is                  present in the aortic position. Procedure Date: 10/18/2004.  Sonographer:    Darlina Sicilian RDCS Sonographer#2:  Darlina Sicilian RDCS Referring Phys: [id number] [place]  1. The mitral valve has been replaced with an  unknown bioprosthesis. No evidence of mitral valve regurgitation. The mean mitral valve gradient is 10.0 mmHg with average heart rate of 101 bpm. PHT 70 ms. Suboptimal LVOT spectral Doppler- VTI deferred. Date of Procedure Date: 10/18/2004.  2. The aortic valve has been repaired/replaced. Aortic valve regurgitation is not visualized. 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 Procedure Date: 10/18/2004. Aortic valve mean gradient measures 16.0 mmHg.  Aortic valve acceleration time measures 74 msec.  3. Left ventricular ejection fraction, by estimation, is 65 to 70%. The left ventricle has normal function. The left ventricle has no regional wall motion abnormalities. There is mild concentric left ventricular hypertrophy. Left ventricular diastolic parameters are indeterminate.  4. Aortic dilatation noted. 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  5. [name] ventricular systolic function is normal. The [name] ventricular size is normal. Comparison(s): A prior study was performed on 08/07/2020. Prior images reviewed side by side. Stable aortic root and ascending aorta size. Increased mitral valve gradients, but at higher hear rates from prior. FINDINGS  Left Ventricle: Left ventricular ejection fraction, by estimation, is 65 to 70%. The left ventricle has normal function. The left ventricle has no regional wall motion abnormalities. Definity contrast agent was given IV to delineate the left ventricular  endocardial borders. The left ventricular internal cavity size was normal in size. There is mild concentric left ventricular hypertrophy. Left  ventricular diastolic parameters are indeterminate. [name] Ventricle: The [name] ventricular size is normal. No increase in [name] ventricular wall thickness. [name] ventricular systolic function is normal. Left Atrium: Left atrial size was normal in size. [name] Atrium: [name] atrial size was normal in size. Pericardium: There is no evidence of pericardial effusion. Mitral Valve: The mitral valve has been repaired/replaced. No evidence of mitral valve regurgitation. There is a 29 mm Carpentier-Edwards prosthetic present in the mitral position. MV peak gradient, 17.1 mmHg. The mean mitral valve gradient is 10.0 mmHg with average heart rate of 101 bpm. Tricuspid Valve: The tricuspid valve is grossly normal. Tricuspid valve regurgitation is mild . No evidence of tricuspid stenosis. Aortic Valve: The aortic valve has been repaired/replaced. Aortic valve regurgitation is not visualized. Aortic valve mean gradient measures 16.0 mmHg. Aortic valve peak gradient measures 24.4 mmHg. Aortic valve area, by VTI measures 1.56 cm. There is a  27 mm Carpentier-Edwards prosthetic valve present in the aortic position. Procedure Date: 10/18/2004. Pulmonic Valve: The pulmonic valve was normal in structure. Pulmonic valve regurgitation is mild. No evidence of pulmonic stenosis. Aorta: The ascending aorta was not well visualized and aortic dilatation noted. 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 IAS/Shunts: The atrial septum is grossly normal.  LEFT VENTRICLE PLAX 2D LVIDd:         4.80 cm LVIDs:         3.50 cm LV PW:         1.30 cm LV IVS:        1.30 cm LVOT diam:     3.25 cm LV SV:         62 LV SV Index:   33 LVOT Area:     8.30 cm  LV Volumes (MOD) LV vol d, MOD A2C: 81.2 ml LV vol d, MOD A4C: 94.4 ml LV vol s, MOD A2C: 18.0 ml LV vol s, MOD A4C:  30.1 ml LV SV MOD A2C:     63.2 ml LV SV MOD A4C:     94.4 ml LV SV MOD BP:      63.9 ml  [name] VENTRICLE RV S prime:     10.40 cm/s LEFT ATRIUM             Index        [name] ATRIUM          Index LA diam:        4.50 cm 2.41 cm/m   RA Area:     8.74 cm LA Vol (A2C):   58.1 ml 31.08 ml/m  RA Volume:   12.60 ml 6.74 ml/m LA Vol (A4C):   56.9 ml 30.44 ml/m LA Biplane Vol: 58.9 ml 31.51 ml/m  AORTIC VALVE AV Area (Vmax):    5.29 cm AV Area (Vmean):   1.55 cm AV Area (VTI):     1.56 cm AV Vmax:           247.00 cm/s AV Vmean:          190.500 cm/s AV VTI:            0.401 m AV Peak Grad:      24.4 mmHg AV Mean Grad:      16.0 mmHg LVOT Vmax:         157.40 cm/s LVOT Vmean:        35.700 cm/s LVOT VTI:          0.075 m LVOT/AV VTI ratio: 0.19  AORTA Ao Asc diam: 4.90 cm MITRAL VALVE                TRICUSPID VALVE MV Area (PHT): 3.16 cm     TR Peak grad:   26.4 mmHg MV Area VTI:   1.39 cm     TR Vmax:        257.00 cm/s MV Peak grad:  17.1 mmHg MV Mean grad:  10.0 mmHg    SHUNTS MV Vmax:       2.07 m/s     Systemic VTI:  0.08 m MV Vmean:      151.0 cm/s   Systemic Diam: 3.25 cm MV Decel Time: 240 msec MV E velocity: 195.00 cm/s Rudean Haskell MD Electronically signed by Rudean Haskell MD Signature Date/Time: 12/15/2020/12:49:20 PM    Final      LOS: 2 days   Oren Binet, MD  [facility]    To contact the attending provider between 7A-7P or the covering provider during after hours 7P-7A, please log into the web site [email or web address] and access using universal [place] password for that web site. If you do not have the password, please call the hospital operator.  12/17/2020, 12:18 PM

## 2020-12-17 NOTE — Progress Notes (Signed)
Cardiology Note:  Chart reviewed. No new cardiac issues today. TEE tomorrow.  Please call us if needed today. We will follow up tomorrow.   Lauree Chandler 12/17/2020 7:37 AM

## 2020-12-17 NOTE — Plan of Care (Signed)
Patient was seen and discussed plan with family.  Consult note to follow.

## 2020-12-17 NOTE — Progress Notes (Signed)
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 Suspect from straight cath and prior foley catheter 10/13 bcx in progress  10/11 tte no obvious sign of valve vegetation; av/mv prosthesis functioning normally  ------- 10/13 assessment Patient clinically improving in mentation, but remains with generalized weakness Pending tee for 10/14   PLAN: Will have lab work  up the 10/11 gpc on blood culture Continue ampicillin for now Further plan depending on repeat bcx and tee Discussed with primary team and patient's wife   I spent more than 35 minute reviewing data/chart, and coordinating care and >50% direct face to face time providing counseling/discussing diagnostics/treatment plan with patient   Active Problems:   Pressure injury of skin   Hypernatremia   Bacteremia   Allergies  Allergen Reactions   Naproxen Hives   No [employer] Food Allergies Other (See Comments)    Scallops - distress, nausea and vomitting    Scheduled Meds:  apixaban  5 mg Oral BID   dofetilide  500 mcg Oral BID   finasteride  5 mg Oral Daily   insulin aspart  0-9 Units Subcutaneous TID WC   mouth  rinse  15 mL Mouth Rinse BID   multivitamin with minerals  1 tablet Oral Daily   sodium chloride flush  3 mL Intravenous Q12H   tamsulosin  0.4 mg Oral Daily   Continuous Infusions:  sodium chloride 50 mL/hr at 12/17/20 0900   sodium chloride     ampicillin (OMNIPEN) IV Stopped (12/17/20 5366)   levETIRAcetam 500 mg (12/17/20 1034)   potassium chloride 10 mEq (12/17/20 1101)   PRN Meds:.sodium chloride, sodium chloride flush   SUBJECTIVE: Improved mentation No fever Repeat bcx 1 of 4 bottles gpc Ucx mssa 10/13 bcx 2nd repeat in progress  Pending tee tomorrow  No rash, n/v/diarrhea  Review of Systems: ROS All other ROS was negative, except mentioned above     OBJECTIVE: Vitals:   12/17/20 0400 12/17/20 0800 12/17/20 0824 12/17/20 1158  BP: 108/67 106/69 107/71 (!) 147/91  Pulse:  77 78 71  Resp:  14 18 18   Temp:   98.7 F (37.1 C) 98.9 F (37.2 C)  TempSrc:   Oral Oral  SpO2:  95% 95% 92%  Weight:      Height:       Body mass index is 26.03 kg/m.  Physical Exam General/constitutional: some verbal, appears chronically ill, no distress, eating and fed by his wife HEENT: Normocephalic, PER, Conj Clear, EOMI, Oropharynx clear Neck supple CV: rrr no mrg Lungs: clear to auscultation, normal respiratory effort Abd: Soft, Nontender Ext: no edema Skin: No Rash Neuro: generalized weakness; no rigidity MSK: no peripheral joint swelling/tenderness   Lab  Results Lab Results  Component Value Date   WBC 10.7 (H) 12/16/2020   HGB 9.9 (L) 12/16/2020   HCT 30.7 (L) 12/16/2020   MCV 88.0 12/16/2020   PLT 106 (L) 12/16/2020    Lab Results  Component Value Date   CREATININE 0.79 12/17/2020   BUN 23 12/17/2020   NA 144 12/17/2020   K 3.9 12/17/2020   CL 117 (H) 12/17/2020   CO2 22 12/17/2020    Lab Results  Component Value Date   ALT 76 (H) 12/16/2020   AST 56 (H) 12/16/2020   ALKPHOS 66 12/16/2020   BILITOT 0.5 12/16/2020      Microbiology: Recent  Results (from the past 240 hour(s))  Culture, blood (Routine x 2)     Status: Abnormal   Collection Time: 12/14/20 12:45 PM   Specimen: BLOOD RIGHT FOREARM  Result Value Ref Range Status   Specimen Description BLOOD RIGHT FOREARM  Final   Special Requests   Final    BOTTLES DRAWN AEROBIC AND ANAEROBIC Blood Culture adequate volume   Culture  Setup Time   Final    GRAM POSITIVE COCCI IN BOTH AEROBIC AND ANAEROBIC BOTTLES CRITICAL RESULT CALLED TO, READ BACK BY AND VERIFIED WITH: PHARMD JAMES LEDFORD 12/15/2020@6 :07 BY TW Performed at [facility], [place] [address] [place], [place] [address]    Culture ENTEROCOCCUS FAECALIS (A)  Final   Report Status 12/17/2020 FINAL  Final   Organism ID, Bacteria ENTEROCOCCUS FAECALIS  Final      Susceptibility   Enterococcus faecalis - MIC*    AMPICILLIN <=2 SENSITIVE Sensitive     VANCOMYCIN 2 SENSITIVE Sensitive     GENTAMICIN SYNERGY SENSITIVE Sensitive     * ENTEROCOCCUS FAECALIS  Blood Culture ID Panel (Reflexed)     Status: Abnormal   Collection Time: 12/14/20 12:45 PM  Result Value Ref Range Status   Enterococcus faecalis DETECTED (A) NOT DETECTED Final    Comment: CRITICAL RESULT CALLED TO, READ BACK BY AND VERIFIED WITH: PHARMD JAMES LEDFORD 12/15/2020@6 :07 BY TW    Enterococcus Faecium NOT DETECTED NOT DETECTED Final   Listeria monocytogenes NOT DETECTED NOT DETECTED Final   Staphylococcus species NOT DETECTED NOT DETECTED Final   Staphylococcus aureus (BCID) NOT DETECTED NOT DETECTED Final   Staphylococcus epidermidis NOT DETECTED NOT DETECTED Final   Staphylococcus lugdunensis NOT DETECTED NOT DETECTED Final   Streptococcus species NOT DETECTED NOT DETECTED Final   Streptococcus agalactiae NOT DETECTED NOT DETECTED Final   Streptococcus pneumoniae NOT DETECTED NOT DETECTED Final   Streptococcus pyogenes NOT DETECTED NOT DETECTED Final   A.calcoaceticus-baumannii NOT DETECTED NOT DETECTED Final   Bacteroides fragilis NOT  DETECTED NOT DETECTED Final   Enterobacterales NOT DETECTED NOT DETECTED Final   Enterobacter cloacae complex NOT DETECTED NOT DETECTED Final   Escherichia coli NOT DETECTED NOT DETECTED Final   Klebsiella aerogenes NOT DETECTED NOT DETECTED Final   Klebsiella oxytoca NOT DETECTED NOT DETECTED Final   Klebsiella pneumoniae NOT DETECTED NOT DETECTED Final   Proteus species NOT DETECTED NOT DETECTED Final   Salmonella species NOT DETECTED NOT DETECTED Final   Serratia marcescens NOT DETECTED NOT DETECTED Final   Haemophilus influenzae NOT DETECTED NOT DETECTED Final   Neisseria meningitidis NOT DETECTED NOT DETECTED Final   Pseudomonas aeruginosa NOT DETECTED NOT DETECTED Final   Stenotrophomonas maltophilia NOT DETECTED NOT DETECTED Final   Candida albicans NOT DETECTED NOT DETECTED Final   Candida auris NOT DETECTED NOT DETECTED Final   Candida  glabrata NOT DETECTED NOT DETECTED Final   Candida krusei NOT DETECTED NOT DETECTED Final   Candida parapsilosis NOT DETECTED NOT DETECTED Final   Candida tropicalis NOT DETECTED NOT DETECTED Final   Cryptococcus neoformans/gattii NOT DETECTED NOT DETECTED Final   Vancomycin resistance NOT DETECTED NOT DETECTED Final    Comment: Performed at [facility], [place] [address], [place], [place] [address]  Resp Panel by RT-PCR (Flu A&B, Covid) Nasopharyngeal Swab     Status: None   Collection Time: 12/14/20  3:41 PM   Specimen: Nasopharyngeal Swab; Nasopharyngeal(NP) swabs in vial transport medium  Result Value Ref Range Status   SARS Coronavirus 2 by RT PCR NEGATIVE NEGATIVE Final    Comment: (NOTE) SARS-CoV-2 target nucleic acids are NOT DETECTED.  The SARS-CoV-2 RNA is generally detectable in upper respiratory specimens during the acute phase of infection. The lowest concentration of SARS-CoV-2 viral copies this assay can detect is 138 copies/mL. A negative result does not preclude SARS-Cov-2 infection and should not be used as the sole  basis for treatment or other patient management decisions. A negative result may occur with  improper specimen collection/handling, submission of specimen other than nasopharyngeal swab, presence of viral mutation(s) within the areas targeted by this assay, and inadequate number of viral copies(<138 copies/mL). A negative result must be combined with clinical observations, patient history, and epidemiological information. The expected result is Negative.  Fact Sheet for Patients:  EntrepreneurPulse.com.au  Fact Sheet for Healthcare Providers:  IncredibleEmployment.be  This test is no t yet approved or cleared by the [place] FDA and  has been authorized for detection and/or diagnosis of SARS-CoV-2 by FDA under an Emergency Use Authorization (EUA). This EUA will remain  in effect (meaning this test can be used) for the duration of the COVID-19 declaration under Section 564(b)(1) of the Act, 21 U.S.C.section 360bbb-3(b)(1), unless the authorization is terminated  or revoked sooner.       Influenza A by PCR NEGATIVE NEGATIVE Final   Influenza B by PCR NEGATIVE NEGATIVE Final    Comment: (NOTE) The Xpert Xpress SARS-CoV-2/FLU/RSV plus assay is intended as an aid in the diagnosis of influenza from Nasopharyngeal swab specimens and should not be used as a sole basis for treatment. Nasal washings and aspirates are unacceptable for Xpert Xpress SARS-CoV-2/FLU/RSV testing.  Fact Sheet for Patients: EntrepreneurPulse.com.au  Fact Sheet for Healthcare Providers: IncredibleEmployment.be  This test is not yet approved or cleared by the [place] FDA and has been authorized for detection and/or diagnosis of SARS-CoV-2 by FDA under an Emergency Use Authorization (EUA). This EUA will remain in effect (meaning this test can be used) for the duration of the COVID-19 declaration under Section 564(b)(1) of the Act,  21 U.S.C. section 360bbb-3(b)(1), unless the authorization is terminated or revoked.  Performed at [facility], [place] [address] [place], [place] [address]   Culture, blood (Routine x 2)     Status: Abnormal   Collection Time: 12/14/20  4:39 PM   Specimen: BLOOD  Result Value Ref Range Status   Specimen Description BLOOD RIGHT HAND  Final   Special Requests   Final    BOTTLES DRAWN AEROBIC AND ANAEROBIC Blood Culture results may not be optimal due to an inadequate volume of blood received in culture bottles   Culture  Setup Time   Final    GRAM POSITIVE COCCI IN CHAINS IN BOTH AEROBIC AND ANAEROBIC BOTTLES CRITICAL VALUE NOTED.  VALUE IS CONSISTENT WITH PREVIOUSLY REPORTED  AND CALLED VALUE.    Culture (A)  Final    ENTEROCOCCUS FAECALIS SUSCEPTIBILITIES PERFORMED ON PREVIOUS CULTURE WITHIN THE LAST 5 DAYS. Performed at [facility], [place] [address], [place], [place] [address]    Report Status 12/17/2020 FINAL  Final  Urine Culture     Status: Abnormal   Collection Time: 12/14/20  4:43 PM   Specimen: Urine, Clean Catch  Result Value Ref Range Status   Specimen Description URINE, CLEAN CATCH  Final   Special Requests   Final    NONE Performed at [facility], [place] [address], [place], [place] [address]    Culture >=100,000 COLONIES/mL STAPHYLOCOCCUS AUREUS (A)  Final   Report Status 12/17/2020 FINAL  Final   Organism ID, Bacteria STAPHYLOCOCCUS AUREUS (A)  Final      Susceptibility   Staphylococcus aureus - MIC*    CIPROFLOXACIN <=0.5 SENSITIVE Sensitive     GENTAMICIN <=0.5 SENSITIVE Sensitive     NITROFURANTOIN 32 SENSITIVE Sensitive     OXACILLIN <=0.25 SENSITIVE Sensitive     TETRACYCLINE <=1 SENSITIVE Sensitive     VANCOMYCIN <=0.5 SENSITIVE Sensitive     TRIMETH/SULFA <=10 SENSITIVE Sensitive     CLINDAMYCIN <=0.25 SENSITIVE Sensitive     RIFAMPIN <=0.5 SENSITIVE Sensitive     Inducible Clindamycin NEGATIVE Sensitive     * >=100,000 COLONIES/mL  STAPHYLOCOCCUS AUREUS  Culture, blood (routine x 2)     Status: None (Preliminary result)   Collection Time: 12/15/20 11:20 AM   Specimen: BLOOD RIGHT ARM  Result Value Ref Range Status   Specimen Description BLOOD RIGHT ARM  Final   Special Requests   Final    BOTTLES DRAWN AEROBIC AND ANAEROBIC Blood Culture adequate volume   Culture  Setup Time   Final    GRAM POSITIVE COCCI ANAEROBIC BOTTLE ONLY CRITICAL VALUE NOTED.  VALUE IS CONSISTENT WITH PREVIOUSLY REPORTED AND CALLED VALUE.    Culture   Final    NO GROWTH 2 DAYS Performed at [facility], [place] [address] [place], [place] [address]    Report Status PENDING  Incomplete  Culture, blood (routine x 2)     Status: None (Preliminary result)   Collection Time: 12/15/20 11:25 AM   Specimen: BLOOD LEFT ARM  Result Value Ref Range Status   Specimen Description BLOOD LEFT ARM  Final   Special Requests   Final    BOTTLES DRAWN AEROBIC ONLY Blood Culture results may not be optimal due to an inadequate volume of blood received in culture bottles   Culture   Final    NO GROWTH 2 DAYS Performed at [facility], [place] [address], [place], [place] [address]    Report Status PENDING  Incomplete  MRSA Next Gen by PCR, Nasal     Status: None   Collection Time: 12/16/20  4:34 AM   Specimen: Nasal Mucosa; Nasal Swab  Result Value Ref Range Status   MRSA by PCR Next Gen NOT DETECTED NOT DETECTED Final    Comment: (NOTE) The GeneXpert MRSA Assay (FDA approved for NASAL specimens only), is one component of a comprehensive MRSA colonization surveillance program. It is not intended to diagnose MRSA infection nor to guide or monitor treatment for MRSA infections. Test performance is not FDA approved in patients less than 63 years old. Performed at [facility], [place] [address] [place], [place] [address]      Serology:   Imaging: If present, new  imagings (plain films, ct scans, and mri) have been personally visualized  and interpreted; radiology reports have been reviewed. Decision making incorporated into the Impression / Recommendations.  10/11 tte  1. The mitral valve has been replaced with an unknown bioprosthesis. No  evidence of mitral valve regurgitation. The mean mitral valve gradient is  10.0 mmHg with average heart rate of 101 bpm. PHT 70 ms. Suboptimal LVOT  spectral Doppler- VTI deferred.  Date of Procedure Date: 10/18/2004.   2. The aortic valve has been repaired/replaced. Aortic valve  regurgitation is not visualized. 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 Procedure Date:  10/18/2004. Aortic valve mean gradient measures 16.0 mmHg.   Aortic valve acceleration time measures 74 msec.   3. Left ventricular ejection fraction, by estimation, is 65 to 70%. The  left ventricle has normal function. The left ventricle has no regional  wall motion abnormalities. There is mild concentric left ventricular  hypertrophy. Left ventricular diastolic  parameters are indeterminate.   4. Aortic dilatation noted. There is moderate dilatation of the aortic  root, measuring 48 mm. 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   5. Right ventricular systolic function is normal. The right ventricular  size is normal.    Jabier Mutton, [facility] for Infectious [place] [phone] pager    12/17/2020, 12:02 PM

## 2020-12-17 NOTE — Progress Notes (Signed)
EEG complete - results pending 

## 2020-12-17 NOTE — Progress Notes (Addendum)
Continues to be encephalopathic today-waxes and wanes-at times he will briefly open his eyes and have a blank stare.  As noted in my note earlier-he was significantly less encephalopathic yesterday-and was able to interact somewhat with family.  Although encephalopathy was thought to be from enterococcal bacteremia/hypernatremia-he has been on antibiotics for several days now-and has improved from that point of view, his hyponatremia has resolved.  Since he continues to have fluctuating encephalopathy-we will pursue LTM EEG and MRI brain (enterococcal bacteremia-could have septic emboli).  I have also consulted neurology for further assistance.  He is on Keppra that can cause somnolence-per spouse-he has been sleeping mostly even while at SNF prior to this hospitalization.

## 2020-12-17 NOTE — Consult Note (Signed)
   [facility] Inpatient Consult   12/17/2020  POSEIDON PAM 1940/05/28 [id number]  [facility] [ACO] Patient: Medicare CMS DCE  Primary Care Provider:  [name], MD, [facility], an Embedded provider with a Chronic Care Management program and provides the Transition of Care [TOC] follow up.  Patient screened for hospitalization with noted high risk score for unplanned readmission risk with 5 admissions in the past 6 months.   Review of patient's medical record reveals patient is being recommended for a skilled facility transition for post hospital rehab. Patient is from [facility] per inpatient TOC notes.  Plan:  Patient's post hospital needs are to be met at a skilled nursing facility level of care.  For questions contact:   Natividad Brood, RN BSN [facility] Liaison  [phone] business mobile phone Toll free office [phone]  Fax number: [phone] [email or web address]

## 2020-12-17 NOTE — Progress Notes (Signed)
LTM EEG hooked up and running - no initial skin breakdown - push button tested - neuro notified. [facility] monitoring.  

## 2020-12-17 NOTE — Procedures (Signed)
Patient Name: [name] [name]  MRN: [id number]  Epilepsy Attending: Lora Havens  Referring Physician/Provider: Dr Oren Binet Date: 12/17/2020 Duration: 24.04 mins  Patient history: 80 year old male with recent subdural hematoma in May, seizures who presented with altered mental status.  EEG to evaluate for seizures.  Level of alertness: Awake  AEDs during EEG study: LEV  Technical aspects: This EEG study was done with scalp electrodes positioned according to the 10-20 International system of electrode placement. Electrical activity was acquired at a sampling rate of 500Hz  and reviewed with a high frequency filter of 70Hz  and a low frequency filter of 1Hz . EEG data were recorded continuously and digitally stored.   Description: The posterior dominant rhythm consists of 8-9 Hz activity of moderate voltage (25-35 uV) seen predominantly in posterior head regions, symmetric and reactive to eye opening and eye closing. EEG showed continuous low amplitude 2 to 3 Hz delta slowing in right frontotemporal region. Intermittent generalized 3 to 5 Hz theta-delta slowing was also noted. Hyperventilation and photic stimulation were not performed.     ABNORMALITY - Continuous slow, right frontotemporal region - Intermittent slow, generalized  IMPRESSION: This study is suggestive of cortical dysfunction in right frontotemporal region likely secondary to underlying structural abnormality/stroke/subdural hemorrhage.  Additionally there is mild diffuse encephalopathy, nonspecific etiology.  No seizures or epileptiform discharges were seen throughout the recording.   If suspicion for ictal- interictal activity remains a concern, a prolonged study can be considered.   Renaud Celli Barbra Sarks

## 2020-12-18 ENCOUNTER — Inpatient Hospital Stay (HOSPITAL_COMMUNITY): Payer: Medicare Other | Admitting: General Practice

## 2020-12-18 ENCOUNTER — Encounter (HOSPITAL_COMMUNITY): Payer: Self-pay | Admitting: Internal Medicine

## 2020-12-18 ENCOUNTER — Inpatient Hospital Stay (HOSPITAL_COMMUNITY): Payer: Medicare Other

## 2020-12-18 ENCOUNTER — Encounter (HOSPITAL_COMMUNITY)
Admission: EM | Disposition: A | Discharge status: Skilled Nursing Facility | Payer: Self-pay | Source: Skilled Nursing Facility | Attending: Internal Medicine

## 2020-12-18 DIAGNOSIS — R7881 Bacteremia: Secondary | ICD-10-CM | POA: Diagnosis not present

## 2020-12-18 DIAGNOSIS — R400 Somnolence: Secondary | ICD-10-CM | POA: Diagnosis not present

## 2020-12-18 DIAGNOSIS — I4891 Unspecified atrial fibrillation: Secondary | ICD-10-CM

## 2020-12-18 DIAGNOSIS — I480 Paroxysmal atrial fibrillation: Secondary | ICD-10-CM | POA: Diagnosis not present

## 2020-12-18 DIAGNOSIS — R569 Unspecified convulsions: Secondary | ICD-10-CM | POA: Diagnosis not present

## 2020-12-18 DIAGNOSIS — G9341 Metabolic encephalopathy: Secondary | ICD-10-CM | POA: Diagnosis not present

## 2020-12-18 DIAGNOSIS — I639 Cerebral infarction, unspecified: Secondary | ICD-10-CM | POA: Diagnosis not present

## 2020-12-18 DIAGNOSIS — I712 Thoracic aortic aneurysm, without rupture, unspecified: Secondary | ICD-10-CM

## 2020-12-18 DIAGNOSIS — E870 Hyperosmolality and hypernatremia: Secondary | ICD-10-CM | POA: Diagnosis not present

## 2020-12-18 DIAGNOSIS — E43 Unspecified severe protein-calorie malnutrition: Secondary | ICD-10-CM | POA: Insufficient documentation

## 2020-12-18 HISTORY — PX: TEE WITHOUT CARDIOVERSION: SHX5443

## 2020-12-18 LAB — BASIC METABOLIC PANEL
Anion gap: 5 (ref 5–15)
BUN: 18 mg/dL (ref 8–23)
CO2: 24 mmol/L (ref 22–32)
Calcium: 7.6 mg/dL — ABNORMAL LOW (ref 8.9–10.3)
Chloride: 114 mmol/L — ABNORMAL HIGH (ref 98–111)
Creatinine, Ser: 0.78 mg/dL (ref 0.61–1.24)
GFR, Estimated: >60 mL/min (ref >60)
Glucose, Bld: 152 mg/dL — ABNORMAL HIGH (ref 70–99)
Potassium: 3.7 mmol/L (ref 3.5–5.1)
Sodium: 143 mmol/L (ref 135–145)

## 2020-12-18 LAB — CBC
HCT: 27.3 % — ABNORMAL LOW (ref 39.0–52.0)
Hemoglobin: 8.8 g/dL — ABNORMAL LOW (ref 13.0–17.0)
MCH: 28.4 pg (ref 26.0–34.0)
MCHC: 32.2 g/dL (ref 30.0–36.0)
MCV: 88.1 fL (ref 80.0–100.0)
Platelets: 132 10*3/uL — ABNORMAL LOW (ref 150–400)
RBC: 3.1 MIL/uL — ABNORMAL LOW (ref 4.22–5.81)
RDW: 16.5 % — ABNORMAL HIGH (ref 11.5–15.5)
WBC: 9.1 10*3/uL (ref 4.0–10.5)
nRBC: 0 % (ref 0.0–0.2)

## 2020-12-18 LAB — MAGNESIUM
Magnesium: 2 mg/dL (ref 1.7–2.4)
Magnesium: 1.9 mg/dL (ref 1.7–2.4)
Magnesium: 1.9 mg/dL (ref 1.7–2.4)

## 2020-12-18 LAB — GLUCOSE, CAPILLARY
Glucose-Capillary: 109 mg/dL — ABNORMAL HIGH (ref 70–99)
Glucose-Capillary: 100 mg/dL — ABNORMAL HIGH (ref 70–99)
Glucose-Capillary: 137 mg/dL — ABNORMAL HIGH (ref 70–99)
Glucose-Capillary: 153 mg/dL — ABNORMAL HIGH (ref 70–99)

## 2020-12-18 LAB — PHOSPHORUS
Phosphorus: 2.8 mg/dL (ref 2.5–4.6)
Phosphorus: 3.1 mg/dL (ref 2.5–4.6)

## 2020-12-18 SURGERY — ECHOCARDIOGRAM, TRANSESOPHAGEAL
Anesthesia: Monitor Anesthesia Care

## 2020-12-18 MED ORDER — LIDOCAINE 2% (20 MG/ML) 5 ML SYRINGE
INTRAMUSCULAR | Status: DC | PRN
  Administered 2020-12-18: 40 mg via INTRAVENOUS

## 2020-12-18 MED ORDER — DOFETILIDE 500 MCG PO CAPS
500.0000 ug | ORAL_CAPSULE | Freq: Two times a day (BID) | ORAL | Status: DC
  Administered 2020-12-18 – 2020-12-22 (×7): 500 ug via ORAL
  Filled 2020-12-18 (×10): qty 1

## 2020-12-18 MED ORDER — FREE WATER
125.0000 mL | Status: DC
  Administered 2020-12-18 – 2020-12-23 (×28): 125 mL

## 2020-12-18 MED ORDER — PROPOFOL 10 MG/ML IV BOLUS
INTRAVENOUS | Status: DC | PRN
  Administered 2020-12-18: 20 mg via INTRAVENOUS

## 2020-12-18 MED ORDER — PHENYLEPHRINE 40 MCG/ML (10ML) SYRINGE FOR IV PUSH (FOR BLOOD PRESSURE SUPPORT)
PREFILLED_SYRINGE | INTRAVENOUS | Status: DC | PRN
  Administered 2020-12-18 (×2): 80 ug via INTRAVENOUS
  Administered 2020-12-18: 40 ug via INTRAVENOUS

## 2020-12-18 MED ORDER — PROPOFOL 500 MG/50ML IV EMUL
INTRAVENOUS | Status: DC | PRN
  Administered 2020-12-18: 100 ug/kg/min via INTRAVENOUS

## 2020-12-18 MED ORDER — PROSOURCE TF PO LIQD
45.0000 mL | Freq: Every day | ORAL | Status: DC
  Administered 2020-12-19 – 2020-12-28 (×10): 45 mL
  Filled 2020-12-18 (×10): qty 45

## 2020-12-18 MED ORDER — OSMOLITE 1.2 CAL PO LIQD
1000.0000 mL | ORAL | Status: AC
  Administered 2020-12-18 – 2020-12-25 (×9): 1000 mL
  Filled 2020-12-18 (×15): qty 1000

## 2020-12-18 MED ORDER — POTASSIUM CHLORIDE 10 MEQ/100ML IV SOLN
10.0000 meq | INTRAVENOUS | Status: AC
  Administered 2020-12-18 (×3): 10 meq via INTRAVENOUS
  Filled 2020-12-18 (×3): qty 100

## 2020-12-18 MED ORDER — SODIUM CHLORIDE 0.9 % IV SOLN: INTRAVENOUS | Status: DC | PRN

## 2020-12-18 MED ORDER — ADULT MULTIVITAMIN W/MINERALS CH
1.0000 | ORAL_TABLET | Freq: Every day | ORAL | Status: DC
  Administered 2020-12-19 – 2020-12-29 (×11): 1
  Filled 2020-12-18 (×11): qty 1

## 2020-12-18 MED ORDER — SODIUM CHLORIDE 0.9 % IV SOLN
2.0000 g | Freq: Two times a day (BID) | INTRAVENOUS | Status: DC
  Administered 2020-12-18 – 2021-01-05 (×37): 2 g via INTRAVENOUS
  Filled 2020-12-18 (×39): qty 20

## 2020-12-18 NOTE — Progress Notes (Signed)
Discontinued cEEG study.  Notified [facility] monitoring.  No skin breakdown observed. 

## 2020-12-18 NOTE — Progress Notes (Signed)
LTM maint complete - no skin breakdown under: Fp1 Fp3 F7  [facility] monitored, Event button test confirmed by [facility].

## 2020-12-18 NOTE — Progress Notes (Signed)
Nutrition Follow-up  DOCUMENTATION CODES:  Severe malnutrition in context of chronic illness  INTERVENTION:  Place Cortrak tube.  Initiate tube feeding via Cortrak: Osmolite 1.2 at 35 ml/h and increase by 10 ml every 8 hours until goal rate of 75 ml/h is reached (1800 ml per day) Prosource TF 45 ml daily  125 ml free water flushes q 4 hrs  Provides 2220 kcal, 111 gm protein, 2226 ml free water daily.  Monitor magnesium, potassium, and phosphorus daily for at least 3 days, MD to replete as needed, as pt is at risk for refeeding syndrome.  Continue MVI with minerals daily per tube.  NUTRITION DIAGNOSIS:  Severe Malnutrition related to chronic illness (CHF, Stroke) as evidenced by percent weight loss, moderate fat depletion, severe muscle depletion. - ongoing  GOAL:  Patient will meet greater than or equal to 90% of their needs - to be met with TF  MONITOR:  TF tolerance, Labs, Weight trends, Skin, I & O's  REASON FOR ASSESSMENT:  Malnutrition Screening Tool    ASSESSMENT:  80 y.o. male presented to the ED with lethargy and change in mental status, pt coming from SNF. PMH includes stroke in May and June of 2022, CHF, diverticulosis, HTN, and T2DM. PT admitted with UTI, dehydration, and Stage II pressure injury.  Per Epic, pt did not eat at all yesterday, 0% intake recorded all 3 meals. Ate 20% for breakfast on 10/12.  Cortrak order placed this morning per MD. RD to order tube feeding as recommended above. Cortrak to be placed after pt's TEE today.  Went to visit patient at bedside. Pt sleeping heavily and unable to arouse by voice or touch.  Medications: reviewed; SSI, MVI with minerals, NaCl @ 30 ml/hr  Labs: reviewed; CBG 109-133 (H)  Diet Order:   Diet Order             Diet NPO time specified Except for: Sips with Meds  Diet effective now                  EDUCATION NEEDS:  No education needs have been identified at this time  Skin:  Skin Assessment: Skin  Integrity Issues: Skin Integrity Issues:: Stage I, Stage II Stage I: Buttocks Stage II: L Elbow  Last BM:  12/18/20 - Type 4, large  Height:  Ht Readings from Last 1 Encounters:  12/14/20 _0  (1.702 m)   Weight:  Wt Readings from Last 1 Encounters:  12/14/20 75.4 kg   BMI:  Body mass index is 26.03 kg/m.  Estimated Nutritional Needs:  Kcal:  2100-2300 Protein:  105-120 grams Fluid:  >/= 2.1 L  Derrel Nip, RD, LDN (she/her/hers) Registered Dietitian I After-Hours/Weekend Pager # in [place]

## 2020-12-18 NOTE — Transfer of Care (Signed)
Immediate Anesthesia Transfer of Care Note  Patient: [name] [name]  Procedure(s) Performed: TRANSESOPHAGEAL ECHOCARDIOGRAM (TEE)  Patient Location: Endoscopy Unit  Anesthesia Type:MAC  Level of Consciousness: drowsy  Airway & Oxygen Therapy: Patient Spontanous Breathing and Patient connected to nasal cannula oxygen  Post-op Assessment: Report given to RN and Post -op Vital signs reviewed and stable  Post vital signs: Reviewed and stable  Last Vitals:  Vitals Value Taken Time  BP 97/60 12/18/20 1354  Temp 36.7 C 12/18/20 1351  Pulse 65 12/18/20 1354  Resp 22 12/18/20 1354  SpO2 98 % 12/18/20 1354    Last Pain:  Vitals:   12/18/20 1351  TempSrc: Temporal  PainSc:          Complications: No notable events documented.

## 2020-12-18 NOTE — Anesthesia Preprocedure Evaluation (Addendum)
Anesthesia Evaluation  Patient identified by MRN, date of birth, ID band Patient unresponsive    Reviewed: Allergy & Precautions, NPO status , Patient's Chart, lab work & pertinent test results  Airway Mallampati: III  TM Distance: >3 FB Neck ROM: Full  Mouth opening: Limited Mouth Opening  Dental no notable dental hx.    Pulmonary neg pulmonary ROS,    Pulmonary exam normal breath sounds clear to auscultation       Cardiovascular hypertension, Pt. on medications and Pt. 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 No evidence of mitral valve regurgitation. The mean mitral valve gradient is 10.0 mmHg with average heart rate of 101 bpm. PHT 70 ms. Suboptimal LVOT spectral Doppler- VTI deferred. Date of Procedure Date: 10/18/2004. 1. The aortic valve has been repaired/replaced. Aortic valve regurgitation is not visualized. 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 Procedure Date: 10/18/2004. Aortic valve mean gradient measures 16.0 mmHg. Aortic valve acceleration time measures 74 msec. 2. Left ventricular ejection fraction, by estimation, is 65 to 70%. The left ventricle has normal function. The left ventricle has no regional wall motion abnormalities. There is mild concentric left ventricular hypertrophy. Left ventricular diastolic parameters are indeterminate. Aortic dilatation noted. 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 Right ventricular systolic function is normal. The right ventricular size is normal.   Neuro/Psych PSYCHIATRIC DISORDERS CVA    GI/Hepatic negative GI ROS, Neg liver ROS,   Endo/Other  diabetes  Renal/GU negative Renal ROS      Musculoskeletal negative musculoskeletal ROS (+)   Abdominal   Peds  Hematology  (+) Blood dyscrasia, , HLD   Anesthesia Other Findings BACTEREMIA  Reproductive/Obstetrics                             Anesthesia Physical Anesthesia Plan  ASA: 4  Anesthesia Plan: MAC   Post-op Pain Management:    Induction: Intravenous  PONV Risk Score and Plan: 1 and Propofol infusion and Treatment may vary due to age or medical condition  Airway Management Planned: Simple Face Mask  Additional Equipment:   Intra-op Plan:   Post-operative Plan:   Informed Consent: I have reviewed the patients History and Physical, chart, labs and discussed the procedure including the risks, benefits and alternatives for the proposed anesthesia with the patient or authorized representative who has indicated his/her understanding and acceptance.   Patient has DNR.  Discussed DNR with power of attorney and Suspend DNR.   Dental advisory given and Consent reviewed with POA  Plan Discussed with: CRNA  Anesthesia Plan Comments: (Anesthetic plan discussed with wife)       Anesthesia Quick Evaluation

## 2020-12-18 NOTE — Progress Notes (Signed)
PROGRESS NOTE        PATIENT DETAILS Name: [name] [name] Age: 80 y.o. Sex: male Date of Birth: Jan 31, 1941 Admit Date: 12/14/2020 Admitting Physician Georgette Shell, MD JJK:[id number], Arvid Right, MD  Brief Narrative: Patient is a 80 y.o. 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  See below for further details.    Subjective: Awakes-not very interactive-sleeping most of the time.  Hardly any oral intake yesterday and today.  Objective: Vitals: Blood pressure 122/81, pulse 71, temperature 98.2 F (36.8 C), temperature source Oral, resp. rate 20, height 5\' 7"  (1.702 m), weight 75.4 kg, SpO2 97 %.   Exam: Gen Exam: Sleepy-not in any distress. HEENT:atraumatic, normocephalic Chest: B/L clear to auscultation anteriorly CVS:S1S2 regular Abdomen:soft non tender, non distended Extremities:no edema Neurology: Left-sided hemiparesis unchanged. Skin: no rash   Pertinent Labs/Radiology: Na: 143 K: 3.7 creatinine: 0.78 WBC: 9.1 Hb: 8.8 Platelet: 132  10/10>>Blood culture: Enterococcus 10/10>> urine culture: Staff aureus 10/11>> blood culture: 1/2-gram-positive cocci (?  Contamination) 10/13>> blood culture: No growth  10/10>> CT head: Small right subdural hematoma 10/11>> TTE: EF-65-70%, no obvious vegetation.  10/13-2010/14>> LTM EEG: No seizures.  Assessment/Plan: Acute metabolic encephalopathy: Thought to be due to hypernatremia/enterococcal bacteremia-however continues to wax and wane.  LTM EEG negative for seizures,  awaiting MRI brain-initial triggers for encephalopathy have been treated-however patient continues to be encephalopathic-if no major structural issues seen on MRI brain-May need to talk to neurology about switching Keppra to an alternative agent.  Sepsis secondary to enterococcal bacteremia: Sepsis physiology has resolved-on IV ampicillin-TEE planned for 10/14.  Await further recommendations.  Hypernatremia: Resolved-continue to encourage oral intake-on gentle hydration with IVF.  Watch closely.  PAF: Continue Tikosyn-remains on Eliquis.  History of seizure disorder: On Keppra-continues to have lingering encephalopathy in the form of lethargy-LTM EEG negative for seizures.  If no major structural abnormality seen on MRI brain-May need to switch to a alternative antiepileptic that does not cause somnolence.  Recent SDH May 2022: From signout from prior MD-Dr. 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  Awaiting repeat MRI brain.  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  Continues to have significant left-sided hemiparesis.  On Eliquis.  History of rheumatic heart disease-s/p remote AVR/MVR (bioprosthetic valve)  Severe failure to thrive syndrome/frailty/poor oral intake: Continues to be encephalopathic-lethargic-continues to have poor oral intake.  After much discussion with family/spouse-we will plan on placing cortrak tube and beginning NG tube feeding.  Once all work-up obtained (LTM EEG, MRI brain, TEE)-we will need to discuss with family regarding long-term goals of care.  DNR in place-family well aware of tenuous overall clinical situation.  Nutrition Status: Nutrition Problem: Severe Malnutrition Etiology: chronic illness (CHF, Stroke) Signs/Symptoms: percent weight loss, moderate fat depletion, severe  muscle depletion Percent weight loss: 12 % Interventions: MVI, Hormel Shake    Procedures: None Consults: None DVT Prophylaxis: Eliquis Code Status:DNR Family Communication: [name] at bedside-10/14.  Time spent: 35 minutes-Greater than  50% of this time was spent in counseling, explanation of diagnosis, planning of further management, and coordination of care.  Diet: Diet Order             Diet NPO time specified Except for: Sips with Meds  Diet effective now                      Disposition Plan: Status is: Inpatient  Remains inpatient appropriate because:Inpatient level of care appropriate due to severity of illness  Dispo: The patient is from: SNF              Anticipated d/c is to: SNF              Patient currently is not medically stable to d/c.   Difficult to place patient No    Barriers to Discharge: Lingering encephalopathy-enterococcal bacteremia-TEE on 10/14-remains on IV ampicillin.  Antimicrobial agents: Anti-infectives (From admission, onward)    Start     Dose/Rate Route Frequency Ordered Stop   12/15/20 1700  cefTRIAXone (ROCEPHIN) 1 g in sodium chloride 0.9 % 100 mL IVPB  Status:  Discontinued        1 g 200 mL/hr over 30 Minutes Intravenous Every 24 hours 12/14/20 1648 12/15/20 0624   12/15/20 0630  ampicillin (OMNIPEN) 2 g in sodium chloride 0.9 % 100 mL IVPB        2 g 300 mL/hr over 20 Minutes Intravenous Every 6 hours 12/15/20 0624     12/15/20 0630  gentamicin (GARAMYCIN) IVPB 80 mg        80 mg 100 mL/hr over 30 Minutes Intravenous  Once 12/15/20 0628 12/15/20 0732   12/14/20 1645  cefTRIAXone (ROCEPHIN) 1 g in sodium chloride 0.9 % 100 mL IVPB        1 g 200 mL/hr over 30 Minutes Intravenous  Once 12/14/20 1642 12/14/20 1830        MEDICATIONS: Scheduled Meds:  apixaban  5 mg Oral BID   dofetilide  500 mcg Oral BID   feeding supplement (PROSource TF)  45 mL Per Tube Daily   finasteride  5 mg Oral Daily   free water   125 mL Per Tube Q4H   insulin aspart  0-9 Units Subcutaneous TID WC   mouth rinse  15 mL Mouth Rinse BID   [START ON 12/19/2020] multivitamin with minerals  1 tablet Per Tube Daily   sodium chloride flush  3 mL Intravenous Q12H   tamsulosin  0.4 mg Oral Daily   Continuous Infusions:  sodium chloride 30 mL/hr at 12/18/20 0734   sodium chloride     ampicillin (OMNIPEN) IV 2 g (12/18/20 0738)   feeding supplement (OSMOLITE 1.2 CAL)     levETIRAcetam 500 mg (12/18/20 0914)   PRN Meds:.sodium chloride, sodium chloride flush   I have personally reviewed following labs and imaging studies  LABORATORY DATA: CBC: Recent Labs  Lab 12/14/20 1245 12/14/20 1504 12/15/20 0508 12/16/20 0130 12/18/20 0221  WBC 12.0*  --  11.1* 10.7* 9.1  NEUTROABS 9.3*  --   --  7.7  --   HGB 11.7* 18.7* 9.8* 9.9* 8.8*  HCT 36.8* 55.0* 31.6* 30.7* 27.3*  MCV 90.0  --  92.4 88.0 88.1  PLT 119*  --  108* 106* 132*     Basic Metabolic Panel: Recent Labs  Lab 12/14/20 1245 12/14/20 1504 12/14/20 2056 12/15/20 0508 12/16/20 0130 12/17/20 0210 12/18/20 0221  NA 149* 154*  --  148* 146* 144 143  K 4.1 4.3  --  3.8 3.6 3.9 3.7  CL 119*  --   --  119* 117* 117* 114*  CO2 22  --   --  23 22 22 24   GLUCOSE 204*  --   --  144* 145* 208* 152*  BUN 32*  --   --  25* 26* 23 18  CREATININE 1.18  --   --  0.91 0.84 0.79 0.78  CALCIUM 8.6*  --   --  8.1* 8.1* 7.7* 7.6*  MG  --   --  2.3 2.2 2.1 2.1 2.0     GFR: Estimated Creatinine Clearance: 71.1 mL/min (by C-G formula based on SCr of 0.78 mg/dL).  Liver Function Tests: Recent Labs  Lab 12/12/20 0000 12/14/20 1245 12/15/20 0508 12/16/20 0130  AST 47* 63* 48* 56*  ALT 78* 92* 75* 76*  ALKPHOS 82 68 65 66  BILITOT  --  0.5 0.7 0.5  PROT  --  6.8 5.8* 5.8*  ALBUMIN 2.9* 2.3* 2.0* 1.9*    No results for input(s): LIPASE, AMYLASE in the last 168 hours. Recent Labs  Lab 12/14/20 1639  AMMONIA 25     Coagulation Profile: Recent Labs   Lab 12/14/20 1245 12/15/20 0508  INR 1.5* 1.5*     Cardiac Enzymes: No results for input(s): CKTOTAL, CKMB, CKMBINDEX, TROPONINI in the last 168 hours.  BNP (last 3 results) No results for input(s): PROBNP in the last 8760 hours.  Lipid Profile: No results for input(s): CHOL, HDL, LDLCALC, TRIG, CHOLHDL, LDLDIRECT in the last 72 hours.  Thyroid Function Tests: No results for input(s): TSH, T4TOTAL, FREET4, T3FREE, THYROIDAB in the last 72 hours.   Anemia Panel: No results for input(s): VITAMINB12, FOLATE, FERRITIN, TIBC, IRON, RETICCTPCT in the last 72 hours.  Urine analysis:    Component Value Date/Time   COLORURINE YELLOW 12/14/2020 1600   APPEARANCEUR CLOUDY (A) 12/14/2020 1600   LABSPEC 1.020 12/14/2020 1600   PHURINE 6.0 12/14/2020 1600   GLUCOSEU NEGATIVE 12/14/2020 1600   HGBUR LARGE (A) 12/14/2020 1600   BILIRUBINUR NEGATIVE 12/14/2020 1600   BILIRUBINUR negative 11/22/2013 0848   KETONESUR NEGATIVE 12/14/2020 1600   PROTEINUR NEGATIVE 12/14/2020 1600   UROBILINOGEN 0.2 11/22/2013 0848   UROBILINOGEN 1.0 08/05/2013 2207   NITRITE POSITIVE (A) 12/14/2020 1600   LEUKOCYTESUR LARGE (A) 12/14/2020 1600    Sepsis Labs: Lactic Acid, Venous    Component Value Date/Time   LATICACIDVEN 1.8 12/14/2020 2056    MICROBIOLOGY: Recent Results (from the past 240 hour(s))  Culture, blood (Routine x 2)     Status: Abnormal   Collection Time: 12/14/20 12:45 PM   Specimen: BLOOD RIGHT FOREARM  Result Value Ref Range Status   Specimen Description BLOOD RIGHT FOREARM  Final   Special Requests   Final    BOTTLES DRAWN AEROBIC AND ANAEROBIC Blood Culture adequate volume   Culture  Setup Time   Final    GRAM POSITIVE COCCI IN BOTH AEROBIC AND ANAEROBIC BOTTLES CRITICAL RESULT CALLED TO, READ BACK BY AND VERIFIED WITH: PHARMD JAMES LEDFORD 12/15/2020@6 :07 BY TW Performed at [facility], [place] [address], [place], [place] [address]    Culture ENTEROCOCCUS FAECALIS  (A)  Final   Report Status 12/17/2020 FINAL  Final   Organism ID, Bacteria ENTEROCOCCUS FAECALIS  Final      Susceptibility   Enterococcus faecalis - MIC*    AMPICILLIN <=2 SENSITIVE Sensitive     VANCOMYCIN  2 SENSITIVE Sensitive     GENTAMICIN SYNERGY SENSITIVE Sensitive     * ENTEROCOCCUS FAECALIS  Blood Culture ID Panel (Reflexed)     Status: Abnormal   Collection Time: 12/14/20 12:45 PM  Result Value Ref Range Status   Enterococcus faecalis DETECTED (A) NOT DETECTED Final    Comment: CRITICAL RESULT CALLED TO, READ BACK BY AND VERIFIED WITH: PHARMD JAMES LEDFORD 12/15/2020@6 :07 BY TW    Enterococcus Faecium NOT DETECTED NOT DETECTED Final   Listeria monocytogenes NOT DETECTED NOT DETECTED Final   Staphylococcus species NOT DETECTED NOT DETECTED Final   Staphylococcus aureus (BCID) NOT DETECTED NOT DETECTED Final   Staphylococcus epidermidis NOT DETECTED NOT DETECTED Final   Staphylococcus lugdunensis NOT DETECTED NOT DETECTED Final   Streptococcus species NOT DETECTED NOT DETECTED Final   Streptococcus agalactiae NOT DETECTED NOT DETECTED Final   Streptococcus pneumoniae NOT DETECTED NOT DETECTED Final   Streptococcus pyogenes NOT DETECTED NOT DETECTED Final   A.calcoaceticus-baumannii NOT DETECTED NOT DETECTED Final   Bacteroides fragilis NOT DETECTED NOT DETECTED Final   Enterobacterales NOT DETECTED NOT DETECTED Final   Enterobacter cloacae complex NOT DETECTED NOT DETECTED Final   Escherichia coli NOT DETECTED NOT DETECTED Final   Klebsiella aerogenes NOT DETECTED NOT DETECTED Final   Klebsiella oxytoca NOT DETECTED NOT DETECTED Final   Klebsiella pneumoniae NOT DETECTED NOT DETECTED Final   Proteus species NOT DETECTED NOT DETECTED Final   Salmonella species NOT DETECTED NOT DETECTED Final   Serratia marcescens NOT DETECTED NOT DETECTED Final   Haemophilus influenzae NOT DETECTED NOT DETECTED Final   Neisseria meningitidis NOT DETECTED NOT DETECTED Final   Pseudomonas  aeruginosa NOT DETECTED NOT DETECTED Final   Stenotrophomonas maltophilia NOT DETECTED NOT DETECTED Final   Candida albicans NOT DETECTED NOT DETECTED Final   Candida auris NOT DETECTED NOT DETECTED Final   Candida glabrata NOT DETECTED NOT DETECTED Final   Candida krusei NOT DETECTED NOT DETECTED Final   Candida parapsilosis NOT DETECTED NOT DETECTED Final   Candida tropicalis NOT DETECTED NOT DETECTED Final   Cryptococcus neoformans/gattii NOT DETECTED NOT DETECTED Final   Vancomycin resistance NOT DETECTED NOT DETECTED Final    Comment: Performed at [facility], [place]. [address] [place], [place] [address]  Resp Panel by RT-PCR (Flu A&B, Covid) Nasopharyngeal Swab     Status: None   Collection Time: 12/14/20  3:41 PM   Specimen: Nasopharyngeal Swab; Nasopharyngeal(NP) swabs in vial transport medium  Result Value Ref Range Status   SARS Coronavirus 2 by RT PCR NEGATIVE NEGATIVE Final    Comment: (NOTE) SARS-CoV-2 target nucleic acids are NOT DETECTED.  The SARS-CoV-2 RNA is generally detectable in upper respiratory specimens during the acute phase of infection. The lowest concentration of SARS-CoV-2 viral copies this assay can detect is 138 copies/mL. A negative result does not preclude SARS-Cov-2 infection and should not be used as the sole basis for treatment or other patient management decisions. A negative result may occur with  improper specimen collection/handling, submission of specimen other than nasopharyngeal swab, presence of viral mutation(s) within the areas targeted by this assay, and inadequate number of viral copies(<138 copies/mL). A negative result must be combined with clinical observations, patient history, and epidemiological information. The expected result is Negative.  Fact Sheet for Patients:  EntrepreneurPulse.com.au  Fact Sheet for Healthcare Providers:  IncredibleEmployment.be  This test is no t yet approved  or cleared by the [place] FDA and  has been authorized for detection and/or  diagnosis of SARS-CoV-2 by FDA under an Emergency Use Authorization (EUA). This EUA will remain  in effect (meaning this test can be used) for the duration of the COVID-19 declaration under Section 564(b)(1) of the Act, 21 U.S.C.section 360bbb-3(b)(1), unless the authorization is terminated  or revoked sooner.       Influenza A by PCR NEGATIVE NEGATIVE Final   Influenza B by PCR NEGATIVE NEGATIVE Final    Comment: (NOTE) The Xpert Xpress SARS-CoV-2/FLU/RSV plus assay is intended as an aid in the diagnosis of influenza from Nasopharyngeal swab specimens and should not be used as a sole basis for treatment. Nasal washings and aspirates are unacceptable for Xpert Xpress SARS-CoV-2/FLU/RSV testing.  Fact Sheet for Patients: EntrepreneurPulse.com.au  Fact Sheet for Healthcare Providers: IncredibleEmployment.be  This test is not yet approved or cleared by the [place] FDA and has been authorized for detection and/or diagnosis of SARS-CoV-2 by FDA under an Emergency Use Authorization (EUA). This EUA will remain in effect (meaning this test can be used) for the duration of the COVID-19 declaration under Section 564(b)(1) of the Act, 21 U.S.C. section 360bbb-3(b)(1), unless the authorization is terminated or revoked.  Performed at [facility], [place] [address], [place], [place] [address]   Culture, blood (Routine x 2)     Status: Abnormal   Collection Time: 12/14/20  4:39 PM   Specimen: BLOOD  Result Value Ref Range Status   Specimen Description BLOOD RIGHT HAND  Final   Special Requests   Final    BOTTLES DRAWN AEROBIC AND ANAEROBIC Blood Culture results may not be optimal due to an inadequate volume of blood received in culture bottles   Culture  Setup Time   Final    GRAM POSITIVE COCCI IN CHAINS IN BOTH AEROBIC AND ANAEROBIC BOTTLES CRITICAL VALUE  NOTED.  VALUE IS CONSISTENT WITH PREVIOUSLY REPORTED AND CALLED VALUE.    Culture (A)  Final    ENTEROCOCCUS FAECALIS SUSCEPTIBILITIES PERFORMED ON PREVIOUS CULTURE WITHIN THE LAST 5 DAYS. Performed at [facility], [place] [address] [place], [place] [address]    Report Status 12/17/2020 FINAL  Final  Urine Culture     Status: Abnormal   Collection Time: 12/14/20  4:43 PM   Specimen: Urine, Clean Catch  Result Value Ref Range Status   Specimen Description URINE, CLEAN CATCH  Final   Special Requests   Final    NONE Performed at [facility], [place] [address], [place],  [address]    Culture >=100,000 COLONIES/mL STAPHYLOCOCCUS AUREUS (A)  Final   Report Status 12/17/2020 FINAL  Final   Organism ID, Bacteria STAPHYLOCOCCUS AUREUS (A)  Final      Susceptibility   Staphylococcus aureus - MIC*    CIPROFLOXACIN <=0.5 SENSITIVE Sensitive     GENTAMICIN <=0.5 SENSITIVE Sensitive     NITROFURANTOIN 32 SENSITIVE Sensitive     OXACILLIN <=0.25 SENSITIVE Sensitive     TETRACYCLINE <=1 SENSITIVE Sensitive     VANCOMYCIN <=0.5 SENSITIVE Sensitive     TRIMETH/SULFA <=10 SENSITIVE Sensitive     CLINDAMYCIN <=0.25 SENSITIVE Sensitive     RIFAMPIN <=0.5 SENSITIVE Sensitive     Inducible Clindamycin NEGATIVE Sensitive     * >=100,000 COLONIES/mL STAPHYLOCOCCUS AUREUS  Culture, blood (routine x 2)     Status: None (Preliminary result)   Collection Time: 12/15/20 11:20 AM   Specimen: BLOOD RIGHT ARM  Result Value Ref Range Status   Specimen Description BLOOD RIGHT ARM  Final  Special Requests   Final    BOTTLES DRAWN AEROBIC AND ANAEROBIC Blood Culture adequate volume   Culture  Setup Time   Final    GRAM POSITIVE COCCI ANAEROBIC BOTTLE ONLY CRITICAL VALUE NOTED.  VALUE IS CONSISTENT WITH PREVIOUSLY REPORTED AND CALLED VALUE.    Culture   Final    GRAM POSITIVE COCCI IDENTIFICATION TO FOLLOW Performed at [facility], [place] [address] [place], [place] [address]     Report Status PENDING  Incomplete  Culture, blood (routine x 2)     Status: None (Preliminary result)   Collection Time: 12/15/20 11:25 AM   Specimen: BLOOD LEFT ARM  Result Value Ref Range Status   Specimen Description BLOOD LEFT ARM  Final   Special Requests   Final    BOTTLES DRAWN AEROBIC ONLY Blood Culture results may not be optimal due to an inadequate volume of blood received in culture bottles   Culture   Final    NO GROWTH 3 DAYS Performed at [facility], [place] [address] [place], [place] [address]    Report Status PENDING  Incomplete  MRSA Next Gen by PCR, Nasal     Status: None   Collection Time: 12/16/20  4:34 AM   Specimen: Nasal Mucosa; Nasal Swab  Result Value Ref Range Status   MRSA by PCR Next Gen NOT DETECTED NOT DETECTED Final    Comment: (NOTE) The GeneXpert MRSA Assay (FDA approved for NASAL specimens only), is one component of a comprehensive MRSA colonization surveillance program. It is not intended to diagnose MRSA infection nor to guide or monitor treatment for MRSA infections. Test performance is not FDA approved in patients less than 51 years old. Performed at [facility], [place] [address], [place], [place] [address]   Culture, blood (routine x 2)     Status: None (Preliminary result)   Collection Time: 12/17/20  8:01 AM   Specimen: BLOOD RIGHT HAND  Result Value Ref Range Status   Specimen Description BLOOD RIGHT HAND  Final   Special Requests   Final    BOTTLES DRAWN AEROBIC AND ANAEROBIC Blood Culture adequate volume   Culture   Final    NO GROWTH 1 DAY Performed at [name], [place] [address], [place], [place] [address]    Report Status PENDING  Incomplete  Culture, blood (routine x 2)     Status: None (Preliminary result)   Collection Time: 12/17/20  8:03 AM   Specimen: BLOOD RIGHT WRIST  Result Value Ref Range Status   Specimen Description BLOOD RIGHT WRIST  Final   Special Requests   Final    BOTTLES DRAWN AEROBIC AND  ANAEROBIC Blood Culture adequate volume   Culture   Final    NO GROWTH 1 DAY Performed at [facility], [place] [address], [place], [place] [address]    Report Status PENDING  Incomplete    RADIOLOGY STUDIES/RESULTS: EEG adult  Result Date: 12/17/2020 Lora Havens, MD     12/17/2020  1:00 PM Patient Name: [name] [name] MRN: [id number] Epilepsy Attending: Lora Havens Referring Physician/Provider: Dr Oren Binet Date: 12/17/2020 Duration: 24.04 mins Patient history: 80 year old male with recent subdural hematoma in May, seizures who presented with altered mental status.  EEG to evaluate for seizures. Level of alertness: Awake AEDs during EEG study: LEV Technical aspects: This EEG study was done with scalp electrodes positioned according to the 10-20 International system of electrode placement. Dealer  activity was acquired at a sampling rate of 500Hz  and reviewed with a high frequency filter of 70Hz  and a low frequency filter of 1Hz . EEG data were recorded continuously and digitally stored. Description: The posterior dominant rhythm consists of 8-9 Hz activity of moderate voltage (25-35 uV) seen predominantly in posterior head regions, symmetric and reactive to eye opening and eye closing. EEG showed continuous low amplitude 2 to 3 Hz delta slowing in right frontotemporal region. Intermittent generalized 3 to 5 Hz theta-delta slowing was also noted. Hyperventilation and photic stimulation were not performed.   ABNORMALITY - Continuous slow, right frontotemporal region - Intermittent slow, generalized IMPRESSION: This study is suggestive of cortical dysfunction in right frontotemporal region likely secondary to underlying structural abnormality/stroke/subdural hemorrhage.  Additionally there is mild diffuse encephalopathy, nonspecific etiology.  No seizures or epileptiform discharges were seen throughout the recording.  If suspicion for ictal- interictal activity remains a concern, a  prolonged study can be considered. Priyanka Barbra Sarks   Overnight EEG with video  Result Date: 12/18/2020 Lora Havens, MD     12/18/2020  9:35 AM Patient Name: [name] [name] MRN: [id number] Epilepsy Attending: Lora Havens Referring Physician/Provider: Dr Oren Binet Duration: 12/17/2020 1638 to 12/18/2020 0930  Patient history: 80 year old male with recent subdural hematoma in May, seizures who presented with altered mental status.  EEG to evaluate for seizures.  Level of alertness: Awake, asleep  AEDs during EEG study: LEV  Technical aspects: This EEG study was done with scalp electrodes positioned according to the 10-20 International system of electrode placement. Electrical activity was acquired at a sampling rate of 500Hz  and reviewed with a high frequency filter of 70Hz  and a low frequency filter of 1Hz . EEG data were recorded continuously and digitally stored.  Description: The posterior dominant rhythm consists of 8-9 Hz activity of moderate voltage (25-35 uV) seen predominantly in posterior head regions, symmetric and reactive to eye opening and eye closing.  Sleep was characterized by vertex waves, sleep spindles (12 to 14 Hz), maximal frontocentral region.  EEG showed continuous low amplitude 2 to 3 Hz delta slowing in right frontotemporal region. Hyperventilation and photic stimulation were not performed.    ABNORMALITY - Continuous slow, right frontotemporal region  IMPRESSION: This study is suggestive of cortical dysfunction in right frontotemporal region likely secondary to underlying structural abnormality/stroke/subdural hemorrhage. No seizures or epileptiform discharges were seen throughout the recording. [place]     LOS: 3 days   Oren Binet, MD  [facility]    To contact the attending provider between 7A-7P or the covering provider during after hours 7P-7A, please log into the web site [email or web address] and access using universal [place] password for  that web site. If you do not have the password, please call the hospital operator.  12/18/2020, 11:36 AM

## 2020-12-18 NOTE — Procedures (Addendum)
Patient Name: [name] [name]  MRN: [id number]  Epilepsy Attending: Lora Havens  Referring Physician/Provider: Dr Oren Binet Duration: 12/17/2020 1638 to 12/18/2020 1002   Patient history: 80 year old male with recent subdural hematoma in May, seizures who presented with altered mental status.  EEG to evaluate for seizures.   Level of alertness: Awake, asleep   AEDs during EEG study: LEV   Technical aspects: This EEG study was done with scalp electrodes positioned according to the 10-20 International system of electrode placement. Electrical activity was acquired at a sampling rate of 500Hz  and reviewed with a high frequency filter of 70Hz  and a low frequency filter of 1Hz . EEG data were recorded continuously and digitally stored.    Description: The posterior dominant rhythm consists of 8-9 Hz activity of moderate voltage (25-35 uV) seen predominantly in posterior head regions, symmetric and reactive to eye opening and eye closing.  Sleep was characterized by vertex waves, sleep spindles (12 to 14 Hz), maximal frontocentral region.  EEG showed continuous low amplitude 2 to 3 Hz delta slowing in right frontotemporal region. Hyperventilation and photic stimulation were not performed.      ABNORMALITY - Continuous slow, right frontotemporal region   IMPRESSION: This study is suggestive of cortical dysfunction in right frontotemporal region likely secondary to underlying structural abnormality/stroke/subdural hemorrhage. No seizures or epileptiform discharges were seen throughout the recording.  Rachelanne Whidby Barbra Sarks

## 2020-12-18 NOTE — Progress Notes (Signed)
LTM EEG skin check done= no breakdown noted. Impedance= good. Marker button tested.

## 2020-12-18 NOTE — Progress Notes (Addendum)
Pharmacy: Dofetilide (Tikosyn) - Follow Up Assessment and Electrolyte Replacement  Pharmacy consulted to assist in monitoring and replacing electrolytes in this 80 y.o. male admitted on 12/14/2020 undergoing dofetilide re-initiation.   Labs:    Component Value Date/Time   K 3.7 12/18/2020 0221   K 4.1 11/26/2013 0000   MG 2.0 12/18/2020 0221   MG 2.6 12/10/2020 0000     Plan: Potassium: K 3.5-3.7:  3 potassium runs ordered my primary team  Magnesium: Mg 2.0 - hold supplements at this time   Thank you for allowing pharmacy to participate in this patient's care   Erin Hearing PharmD., BCPS Clinical Pharmacist 12/18/2020 1:05 PM

## 2020-12-18 NOTE — H&P (View-Only) (Signed)
Patient ID: [name] [name], male   DOB: 1940/07/21, 80 y.o.   MRN: [id number]     Advanced Heart Failure Rounding Note  PCP-Cardiologist: Loralie Champagne, MD   Subjective:    Per wife, patient is more alert this morning.  Eyes are open, moving hand.     Objective:   Weight Range: 75.4 kg Body mass index is 26.03 kg/m.   Vital Signs:   Temp:  [98.2 F (36.8 C)-99.3 F (37.4 C)] 98.2 F (36.8 C) (10/14 0714) Pulse Rate:  [71-96] 71 (10/14 0714) Resp:  [14-25] 20 (10/14 0714) BP: (100-131)/(65-87) 122/81 (10/14 0714) SpO2:  [92 %-97 %] 97 % (10/14 0714) Last BM Date: 12/18/20  Weight change: Filed Weights   12/14/20 1229  Weight: 75.4 kg    Intake/Output:   Intake/Output Summary (Last 24 hours) at 12/18/2020 0913 Last data filed at 12/18/2020 0700 Gross per 24 hour  Intake 1682.01 ml  Output 1675 ml  Net 7.01 ml      Physical Exam    General:  Well appearing. No resp difficulty HEENT: Normal Neck: Supple. JVP not elevated. Carotids 2+ bilat; no bruits. No lymphadenopathy or thyromegaly appreciated. Cor: PMI nondisplaced. Regular rate & rhythm. 1/6 SEM RUSB. Lungs: Clear Abdomen: Soft, nontender, nondistended. No hepatosplenomegaly. No bruits or masses. Good bowel sounds. Extremities: No cyanosis, clubbing, rash, edema Neuro: Alert & orientedx3, cranial nerves grossly intact. moves all 4 extremities w/o difficulty. 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 Personally reviewed.   Labs    CBC Recent Labs    12/16/20 0130 12/18/20 0221  WBC 10.7* 9.1  NEUTROABS 7.7  --   HGB 9.9* 8.8*  HCT 30.7* 27.3*  MCV 88.0 88.1  PLT 106* 626*   Basic Metabolic Panel Recent Labs    12/17/20 0210 12/18/20 0221  NA 144 143  K 3.9 3.7  CL 117* 114*  CO2 22 24  GLUCOSE 208* 152*  BUN 23 18  CREATININE 0.79 0.78  CALCIUM 7.7* 7.6*  MG 2.1 2.0   Liver Function Tests Recent Labs    12/16/20 0130  AST 56*  ALT 76*  ALKPHOS 66  BILITOT 0.5   PROT 5.8*  ALBUMIN 1.9*   No results for input(s): LIPASE, AMYLASE in the last 72 hours. Cardiac Enzymes No results for input(s): CKTOTAL, CKMB, CKMBINDEX, TROPONINI in the last 72 hours.  BNP: BNP (last 3 results) No results for input(s): BNP in the last 8760 hours.  ProBNP (last 3 results) No results for input(s): PROBNP in the last 8760 hours.   D-Dimer No results for input(s): DDIMER in the last 72 hours. Hemoglobin A1C Recent Labs    12/17/20 0210  HGBA1C 7.3*   Fasting Lipid Panel No results for input(s): CHOL, HDL, LDLCALC, TRIG, CHOLHDL, LDLDIRECT in the last 72 hours. Thyroid Function Tests No results for input(s): TSH, T4TOTAL, T3FREE, THYROIDAB in the last 72 hours.  Invalid input(s): FREET3  Other results:   Imaging    EEG adult  Result Date: 12/17/2020 Lora Havens, MD     12/17/2020  1:00 PM Patient Name: [name] [name] MRN: [id number] Epilepsy Attending: Lora Havens Referring Physician/Provider: Dr Oren Binet Date: 12/17/2020 Duration: 24.04 mins Patient history: 80 year old male with recent subdural hematoma in May, seizures who presented with altered mental status.  EEG to evaluate for seizures. Level of alertness: Awake AEDs during EEG study: LEV Technical aspects: This EEG study was done with scalp electrodes positioned  according to the 10-20 International system of electrode placement. Electrical activity was acquired at a sampling rate of 500Hz  and reviewed with a high frequency filter of 70Hz  and a low frequency filter of 1Hz . EEG data were recorded continuously and digitally stored. Description: The posterior dominant rhythm consists of 8-9 Hz activity of moderate voltage (25-35 uV) seen predominantly in posterior head regions, symmetric and reactive to eye opening and eye closing. EEG showed continuous low amplitude 2 to 3 Hz delta slowing in right frontotemporal region. Intermittent generalized 3 to 5 Hz theta-delta slowing was also  noted. Hyperventilation and photic stimulation were not performed.   ABNORMALITY - Continuous slow, right frontotemporal region - Intermittent slow, generalized IMPRESSION: This study is suggestive of cortical dysfunction in right frontotemporal region likely secondary to underlying structural abnormality/stroke/subdural hemorrhage.  Additionally there is mild diffuse encephalopathy, nonspecific etiology.  No seizures or epileptiform discharges were seen throughout the recording.  If suspicion for ictal- interictal activity remains a concern, a prolonged study can be considered. Priyanka Barbra Sarks     Medications:     Scheduled Medications:  apixaban  5 mg Oral BID   dofetilide  500 mcg Oral BID   finasteride  5 mg Oral Daily   insulin aspart  0-9 Units Subcutaneous TID WC   mouth rinse  15 mL Mouth Rinse BID   multivitamin with minerals  1 tablet Oral Daily   sodium chloride flush  3 mL Intravenous Q12H   tamsulosin  0.4 mg Oral Daily    Infusions:  sodium chloride 30 mL/hr at 12/18/20 0734   sodium chloride     ampicillin (OMNIPEN) IV 2 g (12/18/20 0738)   levETIRAcetam Stopped (12/17/20 2139)   potassium chloride 10 mEq (12/18/20 0912)    PRN Medications: sodium chloride, sodium chloride flush   Assessment/Plan   1. 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  Baseline, he is bed-bound and per wife communicates minimally.  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  Currently, he is awake and moves limbs.  Not communicative.  Wife reports improved.  2. Hypernatremia: Resolved, Na 143 today.  3. Enterococcal bacteremia: Source uncertain.  No PNA noted on CXR.  Enterococcus did not grow in urine.  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  - Plan for TEE today (around 2).  Discussed risks/benefits with  wife, she consents for patient.  4. 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  Echo this admission with EF 65-70%, normal RV, bioprosthetic AVR mean gradient 16 mmHg appears normal, bioprosthetic MV mean gradient 10, increased from prior but HR also in 100s when study done.  - TEE today as above.  5. Atrial fibrillation/Flutter: Maze in 2006 + 2 flutter/fib ablations at [facility]. Remains in NSR.  - Continue Tikosyn, QTc ok on initial ECG.  - Continue Eliquis.  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   Length of Stay: 3  Loralie Champagne, MD  12/18/2020, 9:13 AM  Advanced Heart Failure Team Pager [phone] (M-F; 7a - 5p)  Please contact [facility] for night-coverage after hours (5p -7a ) and weekends on [email or web address]

## 2020-12-18 NOTE — CV Procedure (Signed)
Procedure: TEE  Sedation: Per anesthesiology  Indication: Endocarditis  Findings: Please see echo section for full report.  Normal LV size with EF 60-65%, normal wall motion and normal wall thickness . The RV was not well-visualized but probably normal size and systolic function.  Mild left atrial enlargement.  The LA appendage appeared to be partially oversewn but still connects to LA, no thrombus.  Normal right atrium.  No PFO or ASD by color doppler.  Trivial TR, no TV vegetation.  No PV vegetation.  Bioprosthetic aortic valve with mean gradient 14, trivial regurgitation.  No vegetation noted.  There was a bioprosthetic mitral valve with trivial regurgitation, mean gradient 8 mmHg.  There was vegetation on the sewing ring and also on the leaflets (relatively small size).    Impression: Prosthetic mitral valve endocarditis.  There is only trivial mitral regurgitation.   [name] [name] 12/18/2020 1:48 PM

## 2020-12-18 NOTE — Consult Note (Addendum)
Neurology Consult Neurology Consultation  Reason for Consult: encephalopathy.  Referring Physician: Kathreen Cosier, MD.   CC: encephalopathy.   History is obtained from: chart.   HPI: [name] [name] is a 80 y.o. male with a PMHx of a SDH with craniotomy evacuation with a stay 5/23-5/27. He then was sent to CIR, but had an acute stroke on 6/3 with IR revascularization of  ICA. After this, he was sent back to CIR  on 6/11 where he staying til 7/14. He was sent to SNF on 7/15-10-9. Wife noted patient to be more lethargic, taking less po, with a weight loss of 10 pounds over a few weeks. Labs at SNF showed hypernatremia with decrease to 148 with IVF. However, he became lethargic again with decreased responsiveness and he was sent to [facility] ED on 12/14/20.   Workup in ED showed leukocytosis. CTH showed no acute findings with small right old SDH  82mm compared to 44mm preciously. No new hemorrhage. A UTI was found and antibiotics were started. He has also been treated for dehydration and hypernatremia, along with sepsis secondary to enterococcal bacteriemia. ID is following.   Because of waxing and waning mental status a rEEG followed by a cEEG were performed with results of no seizures.   Other PMHx includes AVR/MVR replacement, PAF on Eliquis, and AFTT.   Neurology was asked to consult for changes in mental status.   ROS: Unable to perform due to mental status.   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    Other and  unspecified hyperlipidemia    Pre-diabetes    Previous back surgery 1978, jan 2007   Psychosexual dysfunction with inhibited sexual excitement    SOB (shortness of breath)    "with heavy exercise"   Stroke ([place]) 08/2013   "I WAS IN AFIB AND THREW A CLOT, THE EFFECTS WERE TRANSITORY AND DIDNT LAST BUT FOR 55 MINUTES"    Thoracic aortic aneurysm     Family History  Problem Relation Age of Onset   Macular degeneration Mother        macular degeneration   Cancer Father        intestinal/GI   Diabetes Paternal Aunt    Heart attack Maternal Grandfather    Diabetes Brother    Social History:   reports that he has never smoked. He has never used smokeless tobacco. He reports that he does not drink alcohol and does not use drugs.  Medications  Current Facility-Administered Medications:    0.45 % sodium chloride infusion, , Intravenous, Continuous, Ghimire, Henreitta Leber, MD, Last Rate: 30 mL/hr at 12/18/20 0734, Rate Change at 12/18/20 0734   0.9 %  sodium chloride infusion, 250 mL, Intravenous, PRN, Georgette Shell, MD   ampicillin (OMNIPEN) 2 g in sodium chloride 0.9 % 100 mL IVPB, 2 g, Intravenous, Q6H, Ledford, James L, RPH, Last Rate: 300 mL/hr at  12/18/20 0738, 2 g at 12/18/20 0738   apixaban (ELIQUIS) tablet 5 mg, 5 mg, Oral, BID, Chotiner, Yevonne Aline, MD, 5 mg at 12/18/20 2993   dofetilide (TIKOSYN) capsule 500 mcg, 500 mcg, Oral, BID, Ghimire, Henreitta Leber, MD   finasteride (PROSCAR) tablet 5 mg, 5 mg, Oral, Daily, Georgette Shell, MD, 5 mg at 12/18/20 0803   insulin aspart (novoLOG) injection 0-9 Units, 0-9 Units, Subcutaneous, TID WC, Ghimire, Henreitta Leber, MD, 1 Units at 12/17/20 1652   levETIRAcetam (KEPPRA) IVPB 500 mg/100 mL premix, 500 mg, Intravenous, Q12H, Georgette Shell, MD, Last Rate: 400 mL/hr at 12/18/20 0914, 500 mg at 12/18/20 7169   MEDLINE mouth rinse, 15 mL, Mouth Rinse, BID, Ghimire, Henreitta Leber, MD, 15 mL at 12/18/20 0804   multivitamin with minerals tablet  1 tablet, 1 tablet, Oral, Daily, Jonetta Osgood, MD, 1 tablet at 12/18/20 0803   sodium chloride flush (NS) 0.9 % injection 3 mL, 3 mL, Intravenous, Q12H, Georgette Shell, MD, 3 mL at 12/18/20 0804   sodium chloride flush (NS) 0.9 % injection 3 mL, 3 mL, Intravenous, PRN, Georgette Shell, MD   tamsulosin [facility]) capsule 0.4 mg, 0.4 mg, Oral, Daily, Georgette Shell, MD, 0.4 mg at 12/18/20 6789  Exam: Current vital signs: BP 122/81   Pulse 71   Temp 98.2 F (36.8 C) (Oral)   Resp 20   Ht 5\' 7"  (1.702 m)   Wt 75.4 kg   SpO2 97%   BMI 26.03 kg/m  Vital signs in last 24 hours: Temp:  [98.2 F (36.8 C)-99.3 F (37.4 C)] 98.2 F (36.8 C) (10/14 0714) Pulse Rate:  [71-96] 71 (10/14 0714) Resp:  [14-25] 20 (10/14 0714) BP: (100-131)/(65-87) 122/81 (10/14 0714) SpO2:  [92 %-97 %] 97 % (10/14 0714)  PE: GEN: Poor appearing male in NAD, lying in bed.  HEENT: normocephalic and atraumatic. LUNGS - Normal respiratory effort.  CV - RRR on tele. ABDOMEN - Soft, nontender. Ext: warm, well perfused. Psych: decreased mental status.   NEURO:  Mental Status: Opens eyes to name calling. Follows commands for squeezing right hand and wiggles toes on RLE. Will not protrude tongue.  Speech/Language: Mute.   Cranial Nerves:  II: PERRL.   III, IV, VI: Lid elevation symmetric and full.  V, VII: Face is symmetrical at rest.   VIII:hearing intact to voice. IX, X: Will not open mouthd.  XI: Head is grossly midline.  FYB:OFBP not open mouth.  Motor:  RUE: grips  3+/5  Does not participate in triceps nor biceps.    LUE: grips  1/5  Does not participate in triceps nor biceps.  Unable to lift LEs off of bed. Wiggles his toes on the right. Very slight movement on left toes.  Hypertonal in RUE. No cogwheeling.   Labs I have reviewed labs in epic and the results pertinent to this consultation are:  CBC    Component Value Date/Time   WBC 9.1 12/18/2020 0221   RBC 3.10 (L)  12/18/2020 0221   HGB 8.8 (L) 12/18/2020 0221   HGB 12.1 11/26/2013 0000   HCT 27.3 (L) 12/18/2020 0221   HCT 36 11/26/2013 0000   PLT 132 (L) 12/18/2020 0221   PLT 242 11/26/2013 0000   MCV 88.1 12/18/2020 0221   MCV 90.9 11/26/2013 0000   MCH 28.4 12/18/2020 0221   MCHC 32.2 12/18/2020 0221   RDW 16.5 (H) 12/18/2020 0221   RDW 14.8 11/26/2013 0000   LYMPHSABS  2.0 12/16/2020 0130   MONOABS 0.8 12/16/2020 0130   EOSABS 0.2 12/16/2020 0130   BASOSABS 0.0 12/16/2020 0130    CMP     Component Value Date/Time   NA 143 12/18/2020 0221   NA 150 (A) 12/13/2020 0000   K 3.7 12/18/2020 0221   K 4.1 11/26/2013 0000   CL 114 (H) 12/18/2020 0221   CL 104 11/26/2013 0000   CO2 24 12/18/2020 0221   CO2 22 11/26/2013 0000   GLUCOSE 152 (H) 12/18/2020 0221   BUN 18 12/18/2020 0221   BUN 29 (A) 12/13/2020 0000   CREATININE 0.78 12/18/2020 0221   CREATININE 1.16 07/17/2015 0959   CALCIUM 7.6 (L) 12/18/2020 0221   CALCIUM 8.5 11/26/2013 0000   PROT 5.8 (L) 12/16/2020 0130   ALBUMIN 1.9 (L) 12/16/2020 0130   ALBUMIN 3.6 11/26/2013 0000   AST 56 (H) 12/16/2020 0130   AST 63 11/26/2013 0000   ALT 76 (H) 12/16/2020 0130   ALT 77 11/26/2013 0000   ALKPHOS 66 12/16/2020 0130   ALKPHOS 78 11/26/2013 0000   BILITOT 0.5 12/16/2020 0130   BILITOT 0.7 11/26/2013 0000   GFRNONAA >60 12/18/2020 0221   GFRNONAA 53 11/26/2013 0000   GFRAA >60 07/24/2019 1414   GFRAA >60 11/26/2013 0000    Lipid Panel     Component Value Date/Time   CHOL 119 08/08/2020 0625   CHOL 102 11/26/2013 0000   TRIG 105 08/08/2020 0625   TRIG 93 11/26/2013 0000   HDL 42 08/08/2020 0625   CHOLHDL 2.8 08/08/2020 0625   VLDL 21 08/08/2020 0625   LDLCALC 56 08/08/2020 0625   LDLCALC 58 11/26/2013 0000   Imaging MD reviewed the images obtained.  CT head Small right subdural hematoma, 3 mm compared with 5 mm previously. No new hemorrhage.  Old right MCA and left parietal infarcts, stable.  Ventriculomegaly  related to ex vacuo dilatation.  Atrophy, chronic microvascular disease.  No acute intracranial abnormality.  MRI brain  Small acute infarct left cerebellar vermis.  Small subacute hematoma anterior right frontal lobe (isodense on CT).   Stable chronic findings of infarcts, volume loss, and chronic microvascular ischemic changes.   cEEG 10/13-10/14 This study is suggestive of cortical dysfunction in right frontotemporal region likely secondary to underlying structural abnormality/stroke/subdural hemorrhage. No seizures or epileptiform discharges were seen throughout the recording.  Assessment: 80 yo male with an extensive recent neurological history who returned to hospital from SNF on 12/14/20 for lethargy. CTH showed smaller old SDH and no new infarct. MRI brain showed small acute infarct left cerebellar vermis and he will need further stroke evaluation. He is on Eliquis for history of AF which is also serving as secondary stroke prevention. His encephalopathy seems to be improving with treatment of infection and removing sedative medications.   Recommendations/Plan:  -Continue to treat metabolic derangements as you are doing.  -Continue to treat sepsis/UTI as you are doing.  - Continue Keppra. - Continue apixaban - The benefit to continuing the apixaban outweighs the risk of bleeding CHA2DS2-VASc - 6 and HAS-BLED Score - 3. - Stroke neurology will follow.  Pt seen by Clance Boll, NP/Neuro and later by MD. Note/plan to be edited by MD as needed.  Pager: [phone]   Attending Attestation:  Patient seen, examined, labs,vitals and notes reviewed. Discussed plan with Clance Boll, NP and agree with assessment and plan as documented above. I have independently reviewed the chart, obtained history, review of systems and examined the patient.  Lynnae Sandhoff, MD Stroke Neurology Page: [phone]

## 2020-12-18 NOTE — Progress Notes (Signed)
Patient ID: [name] [name], male   DOB: 05-01-1940, 80 y.o.   MRN: [id number]     Advanced Heart Failure Rounding Note  PCP-Cardiologist: Loralie Champagne, MD   Subjective:    Per wife, patient is more alert this morning.  Eyes are open, moving hand.     Objective:   Weight Range: 75.4 kg Body mass index is 26.03 kg/m.   Vital Signs:   Temp:  [98.2 F (36.8 C)-99.3 F (37.4 C)] 98.2 F (36.8 C) (10/14 0714) Pulse Rate:  [71-96] 71 (10/14 0714) Resp:  [14-25] 20 (10/14 0714) BP: (100-131)/(65-87) 122/81 (10/14 0714) SpO2:  [92 %-97 %] 97 % (10/14 0714) Last BM Date: 12/18/20  Weight change: Filed Weights   12/14/20 1229  Weight: 75.4 kg    Intake/Output:   Intake/Output Summary (Last 24 hours) at 12/18/2020 0913 Last data filed at 12/18/2020 0700 Gross per 24 hour  Intake 1682.01 ml  Output 1675 ml  Net 7.01 ml      Physical Exam    General:  Well appearing. No resp difficulty HEENT: Normal Neck: Supple. JVP not elevated. Carotids 2+ bilat; no bruits. No lymphadenopathy or thyromegaly appreciated. Cor: PMI nondisplaced. Regular rate & rhythm. 1/6 SEM RUSB. Lungs: Clear Abdomen: Soft, nontender, nondistended. No hepatosplenomegaly. No bruits or masses. Good bowel sounds. Extremities: No cyanosis, clubbing, rash, edema Neuro: Alert & orientedx3, cranial nerves grossly intact. moves all 4 extremities w/o difficulty. 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 Personally reviewed.   Labs    CBC Recent Labs    12/16/20 0130 12/18/20 0221  WBC 10.7* 9.1  NEUTROABS 7.7  --   HGB 9.9* 8.8*  HCT 30.7* 27.3*  MCV 88.0 88.1  PLT 106* 474*   Basic Metabolic Panel Recent Labs    12/17/20 0210 12/18/20 0221  NA 144 143  K 3.9 3.7  CL 117* 114*  CO2 22 24  GLUCOSE 208* 152*  BUN 23 18  CREATININE 0.79 0.78  CALCIUM 7.7* 7.6*  MG 2.1 2.0   Liver Function Tests Recent Labs    12/16/20 0130  AST 56*  ALT 76*  ALKPHOS 66  BILITOT 0.5   PROT 5.8*  ALBUMIN 1.9*   No results for input(s): LIPASE, AMYLASE in the last 72 hours. Cardiac Enzymes No results for input(s): CKTOTAL, CKMB, CKMBINDEX, TROPONINI in the last 72 hours.  BNP: BNP (last 3 results) No results for input(s): BNP in the last 8760 hours.  ProBNP (last 3 results) No results for input(s): PROBNP in the last 8760 hours.   D-Dimer No results for input(s): DDIMER in the last 72 hours. Hemoglobin A1C Recent Labs    12/17/20 0210  HGBA1C 7.3*   Fasting Lipid Panel No results for input(s): CHOL, HDL, LDLCALC, TRIG, CHOLHDL, LDLDIRECT in the last 72 hours. Thyroid Function Tests No results for input(s): TSH, T4TOTAL, T3FREE, THYROIDAB in the last 72 hours.  Invalid input(s): FREET3  Other results:   Imaging    EEG adult  Result Date: 12/17/2020 Lora Havens, MD     12/17/2020  1:00 PM Patient Name: [name] [name] MRN: [id number] Epilepsy Attending: Lora Havens Referring Physician/Provider: Dr Oren Binet Date: 12/17/2020 Duration: 24.04 mins Patient history: 80 year old male with recent subdural hematoma in May, seizures who presented with altered mental status.  EEG to evaluate for seizures. Level of alertness: Awake AEDs during EEG study: LEV Technical aspects: This EEG study was done with scalp electrodes positioned  according to the 10-20 International system of electrode placement. Electrical activity was acquired at a sampling rate of 500Hz  and reviewed with a high frequency filter of 70Hz  and a low frequency filter of 1Hz . EEG data were recorded continuously and digitally stored. Description: The posterior dominant rhythm consists of 8-9 Hz activity of moderate voltage (25-35 uV) seen predominantly in posterior head regions, symmetric and reactive to eye opening and eye closing. EEG showed continuous low amplitude 2 to 3 Hz delta slowing in right frontotemporal region. Intermittent generalized 3 to 5 Hz theta-delta slowing was also  noted. Hyperventilation and photic stimulation were not performed.   ABNORMALITY - Continuous slow, right frontotemporal region - Intermittent slow, generalized IMPRESSION: This study is suggestive of cortical dysfunction in right frontotemporal region likely secondary to underlying structural abnormality/stroke/subdural hemorrhage.  Additionally there is mild diffuse encephalopathy, nonspecific etiology.  No seizures or epileptiform discharges were seen throughout the recording.  If suspicion for ictal- interictal activity remains a concern, a prolonged study can be considered. Priyanka Barbra Sarks     Medications:     Scheduled Medications:  apixaban  5 mg Oral BID   dofetilide  500 mcg Oral BID   finasteride  5 mg Oral Daily   insulin aspart  0-9 Units Subcutaneous TID WC   mouth rinse  15 mL Mouth Rinse BID   multivitamin with minerals  1 tablet Oral Daily   sodium chloride flush  3 mL Intravenous Q12H   tamsulosin  0.4 mg Oral Daily    Infusions:  sodium chloride 30 mL/hr at 12/18/20 0734   sodium chloride     ampicillin (OMNIPEN) IV 2 g (12/18/20 0738)   levETIRAcetam Stopped (12/17/20 2139)   potassium chloride 10 mEq (12/18/20 0912)    PRN Medications: sodium chloride, sodium chloride flush   Assessment/Plan   1. 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  Baseline, he is bed-bound and per wife communicates minimally.  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  Currently, he is awake and moves limbs.  Not communicative.  Wife reports improved.  2. Hypernatremia: Resolved, Na 143 today.  3. Enterococcal bacteremia: Source uncertain.  No PNA noted on CXR.  Enterococcus did not grow in urine.  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  - Plan for TEE today (around 2).  Discussed risks/benefits with  wife, she consents for patient.  4. 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  Echo this admission with EF 65-70%, normal RV, bioprosthetic AVR mean gradient 16 mmHg appears normal, bioprosthetic MV mean gradient 10, increased from prior but HR also in 100s when study done.  - TEE today as above.  5. Atrial fibrillation/Flutter: Maze in 2006 + 2 flutter/fib ablations at [facility]. Remains in NSR.  - Continue Tikosyn, QTc ok on initial ECG.  - Continue Eliquis.  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   Length of Stay: 3  Loralie Champagne, MD  12/18/2020, 9:13 AM  Advanced Heart Failure Team Pager [phone] (M-F; 7a - 5p)  Please contact [facility] for night-coverage after hours (5p -7a ) and weekends on [email or web address]

## 2020-12-18 NOTE — Progress Notes (Signed)
  Echocardiogram Echocardiogram Transesophageal has been performed.  [name] [name] 12/18/2020, 2:20 PM

## 2020-12-18 NOTE — Procedures (Signed)
Cortrak  Person Inserting Tube:  Alroy Dust, Su Duma L, RD Tube Type:  Cortrak - 43 inches Tube Size:  10 Tube Location:  Right nare Initial Placement:  Stomach Secured by: Bridle Technique Used to Measure Tube Placement:  Marking at nare/corner of mouth Cortrak Secured At:  66 cm   Cortrak Tube Team Note:  Consult received to place a Cortrak feeding tube.   X-ray is required, abdominal x-ray has been ordered by the Cortrak team. Please confirm tube placement before using the Cortrak tube.   If the tube becomes dislodged please keep the tube and contact the Cortrak team at [email or web address] ([email or web address]) for replacement.  If after hours and replacement cannot be delayed, place a NG tube and confirm placement with an abdominal x-ray.    Hermina Barters BS, PLDN Clinical Dietitian See [facility] for contact information.

## 2020-12-18 NOTE — Interval H&P Note (Signed)
History and Physical Interval Note:  12/18/2020 1:23 PM  [name] [name]  has presented today for surgery, with the diagnosis of BACTEREMIA.  The various methods of treatment have been discussed with the patient and family. After consideration of risks, benefits and other options for treatment, the patient has consented to  Procedure(s): TRANSESOPHAGEAL ECHOCARDIOGRAM (TEE) (N/A) as a surgical intervention.  The patient's history has been reviewed, patient examined, no change in status, stable for surgery.  I have reviewed the patient's chart and labs.  Questions were answered to the patient's satisfaction.     [name] [employer]

## 2020-12-18 NOTE — Progress Notes (Addendum)
ID Brief note  Patient off the floor for TEE, was not seen.    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 [place], [place] [address] Phone: [phone]  Fax: [phone]

## 2020-12-19 DIAGNOSIS — E870 Hyperosmolality and hypernatremia: Secondary | ICD-10-CM | POA: Diagnosis not present

## 2020-12-19 DIAGNOSIS — Z87898 Personal history of other specified conditions: Secondary | ICD-10-CM

## 2020-12-19 DIAGNOSIS — Z8673 Personal history of transient ischemic attack (TIA), and cerebral infarction without residual deficits: Secondary | ICD-10-CM | POA: Diagnosis not present

## 2020-12-19 DIAGNOSIS — G9341 Metabolic encephalopathy: Secondary | ICD-10-CM | POA: Diagnosis not present

## 2020-12-19 DIAGNOSIS — E43 Unspecified severe protein-calorie malnutrition: Secondary | ICD-10-CM | POA: Diagnosis not present

## 2020-12-19 DIAGNOSIS — I480 Paroxysmal atrial fibrillation: Secondary | ICD-10-CM | POA: Diagnosis not present

## 2020-12-19 DIAGNOSIS — S065XAA Traumatic subdural hemorrhage with loss of consciousness status unknown, initial encounter: Secondary | ICD-10-CM

## 2020-12-19 DIAGNOSIS — I63442 Cerebral infarction due to embolism of left cerebellar artery: Secondary | ICD-10-CM

## 2020-12-19 DIAGNOSIS — R7881 Bacteremia: Secondary | ICD-10-CM | POA: Diagnosis not present

## 2020-12-19 LAB — COMPREHENSIVE METABOLIC PANEL
ALT: 77 U/L — ABNORMAL HIGH (ref 0–44)
AST: 64 U/L — ABNORMAL HIGH (ref 15–41)
Albumin: 1.7 g/dL — ABNORMAL LOW (ref 3.5–5.0)
Alkaline Phosphatase: 68 U/L (ref 38–126)
Anion gap: 8 (ref 5–15)
BUN: 16 mg/dL (ref 8–23)
CO2: 23 mmol/L (ref 22–32)
Calcium: 7.7 mg/dL — ABNORMAL LOW (ref 8.9–10.3)
Chloride: 108 mmol/L (ref 98–111)
Creatinine, Ser: 0.76 mg/dL (ref 0.61–1.24)
GFR, Estimated: >60 mL/min (ref >60)
Glucose, Bld: 186 mg/dL — ABNORMAL HIGH (ref 70–99)
Potassium: 3.5 mmol/L (ref 3.5–5.1)
Sodium: 139 mmol/L (ref 135–145)
Total Bilirubin: 0.4 mg/dL (ref 0.3–1.2)
Total Protein: 5.7 g/dL — ABNORMAL LOW (ref 6.5–8.1)

## 2020-12-19 LAB — GLUCOSE, CAPILLARY
Glucose-Capillary: 203 mg/dL — ABNORMAL HIGH (ref 70–99)
Glucose-Capillary: 182 mg/dL — ABNORMAL HIGH (ref 70–99)
Glucose-Capillary: 140 mg/dL — ABNORMAL HIGH (ref 70–99)
Glucose-Capillary: 169 mg/dL — ABNORMAL HIGH (ref 70–99)
Glucose-Capillary: 159 mg/dL — ABNORMAL HIGH (ref 70–99)

## 2020-12-19 LAB — CBC
HCT: 27.6 % — ABNORMAL LOW (ref 39.0–52.0)
Hemoglobin: 8.8 g/dL — ABNORMAL LOW (ref 13.0–17.0)
MCH: 28 pg (ref 26.0–34.0)
MCHC: 31.9 g/dL (ref 30.0–36.0)
MCV: 87.9 fL (ref 80.0–100.0)
Platelets: 176 10*3/uL (ref 150–400)
RBC: 3.14 MIL/uL — ABNORMAL LOW (ref 4.22–5.81)
RDW: 16.2 % — ABNORMAL HIGH (ref 11.5–15.5)
WBC: 10.1 10*3/uL (ref 4.0–10.5)
nRBC: 0 % (ref 0.0–0.2)

## 2020-12-19 LAB — PHOSPHORUS
Phosphorus: 2.7 mg/dL (ref 2.5–4.6)
Phosphorus: 2.4 mg/dL — ABNORMAL LOW (ref 2.5–4.6)

## 2020-12-19 LAB — CULTURE, BLOOD (ROUTINE X 2): Special Requests: ADEQUATE

## 2020-12-19 LAB — MAGNESIUM
Magnesium: 2 mg/dL (ref 1.7–2.4)
Magnesium: 1.9 mg/dL (ref 1.7–2.4)

## 2020-12-19 MED ORDER — POTASSIUM CHLORIDE CRYS ER 20 MEQ PO TBCR
40.0000 meq | EXTENDED_RELEASE_TABLET | Freq: Two times a day (BID) | ORAL | Status: DC
  Filled 2020-12-19: qty 2

## 2020-12-19 MED ORDER — POTASSIUM CHLORIDE 20 MEQ PO PACK
40.0000 meq | PACK | Freq: Two times a day (BID) | ORAL | Status: AC
  Administered 2020-12-19 (×2): 40 meq
  Filled 2020-12-19 (×2): qty 2

## 2020-12-19 NOTE — Progress Notes (Signed)
OT Cancellation Note  Patient Details Name: [name] [name] MRN: [id number] DOB: [birth year]   Cancelled Treatment:    Reason Eval/Treat Not Completed: Other (comment) (Per RN, pt unresponsive, may be considering palliative consult.)  [name] 12/19/2020, 1:59 PM Nestor Lewandowsky, OTR/L Acute Rehabilitation Services Pager: [phone] Office: [phone]

## 2020-12-19 NOTE — Progress Notes (Signed)
PROGRESS NOTE        PATIENT DETAILS Name: [name] [name] Age: 80 y.o. Sex: male Date of Birth: Oct 26, 1940 Admit Date: 12/14/2020 Admitting Physician Georgette Shell, MD ZDG:LOVFI, Arvid Right, MD  Brief Narrative: Patient is a 80 y.o. 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  See below for further details.    Subjective: Still very lethargic-no oral intake since yesterday.  NG tube in place.  Awakes when examined-not interactive-and then goes back to sleep.  Spouse at bedside.  Objective: Vitals: Blood pressure 113/71, pulse 71, temperature 97.6 F (36.4 C), temperature source Oral, resp. rate 19, height 5\' 7"  (1.702 m), weight 82.6 kg, SpO2 99 %.   Exam: Gen Exam: Not in any distress-awakes but then goes back to sleep.  Not interactive-not following commands. HEENT:atraumatic, normocephalic Chest: B/L clear to auscultation anteriorly CVS:S1S2 regular Abdomen:soft non tender, non distended Extremities:no edema Neurology: Significant left-sided hemiplegia persists.   Skin: no rash   Pertinent Labs/Radiology: Na: 139 K: 3.5  creatinine: 0.76 WBC: 10.1  Hb: 8.8  platelet: 176  10/10>>Blood culture: Enterococcus Enterococcus faecalis 10/10>> urine culture: Staff aureus 10/11>> blood culture: 1/2-Enterococcus faecalis 10/13>> blood culture: 1/2-gram-positive cocci 10/15>> blood culture: No growth  10/10>> CT head: Small right subdural hematoma 10/11>> TTE: EF-65-70%, no obvious vegetation. 10/14>> TEE: Prosthetic  mitral valve vegetation 10/14>> MRI brain: Acute infarct left cerebellar vermis, small subacute hematoma in the anterior right frontal lobe.  10/13-2010/14>> LTM EEG: No seizures.  Assessment/Plan: Acute metabolic encephalopathy: Due to hypernatremia/enterococcal bacteremia-continues to be lethargic in spite of treatment of hypernatremia and enterococcal bacteremia.  Neuroimaging as above-LTM EEG negative for seizures.  Discussed with Dr. Jillyn Hidden patient has been on [place] for a while-unlikely that this is contributing to somnolence.  Plan is to continue to treat with supportive care and IV antibiotics (blood culture still positive-however no sepsis physiology present)-with the hope that mentation will improve over the next few days.  Will await further input from neurology as well.  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  Patient is and not a candidate for redo valve replacement surgery.  ID following will await further recommendations-have repeated blood culture again today.    Hypernatremia: Resolved-continue to encourage oral intake-on gentle hydration with IVF.  Watch closely.  PAF: Continue Tikosyn-remains on Eliquis.  Acute left cerebellar vermis CVA: Suspect could be a septic emboli-has mitral valve endocarditis.  Neurology already following-we will await further input.  History of seizure disorder: On Keppra-LTM EEG negative for seizures.  Discussed with Dr. Erlinda Hong on 10/15-since patient has been on [place] for a while-unlikely that this is contributing to somnolence.   Recent SDH May 2022: From signout from prior MD-Dr. 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  Continues to have significant left-sided hemiparesis.  On Eliquis.  History of  rheumatic heart disease-s/p remote AVR/MVR (bioprosthetic valve)  Severe failure to thrive syndrome/frailty/poor oral intake/goals of care: Continues to be lethargic-no significant oral intake over the past few days-cortak inserted on 10/14-on feeds.  DNR in place-family does not desire aggressive care-but if patient encephalopathy improves-starts interacting with family (even if bedbound )-patient's family not adverse to placing PEG tube (if oral intake continues to be poor) .  Plan is to watch/observe over the next few days-continue IV antibiotics/NG tube feedings-and reassess next week for continued goals of care discussion.    Nutrition Status: Nutrition Problem: Severe Malnutrition Etiology: chronic illness (CHF, Stroke) Signs/Symptoms: percent weight loss, moderate fat depletion, severe muscle depletion Percent weight loss: 12 % Interventions: Tube feeding, Prostat, MVI    Procedures: None Consults: None DVT Prophylaxis: Eliquis Code Status:DNR Family Communication: [name] at bedside-10/15-son as usual was on the phone.  Time spent: 35 minutes-Greater than 50% of this time was spent in counseling, explanation of diagnosis, planning of further management, and coordination of care.  Diet: Diet Order             Diet NPO time specified Except for: Sips with Meds  Diet effective now                      Disposition Plan: Status is: Inpatient  Remains inpatient appropriate because:Inpatient level of care appropriate due to severity of illness  Dispo: The patient is from: SNF              Anticipated d/c is to: SNF              Patient currently is not medically stable to d/c.   Difficult to place patient No    Barriers to Discharge: Persistent encephalopathy-persistent bacteremia with Enterococcus-on IV antibiotics-NG tube  feedings ongoing due to poor oral intake.  Antimicrobial agents: Anti-infectives (From admission, onward)    Start     Dose/Rate Route Frequency Ordered Stop   12/18/20 1500  cefTRIAXone (ROCEPHIN) 2 g in sodium chloride 0.9 % 100 mL IVPB        2 g 200 mL/hr over 30 Minutes Intravenous Every 12 hours 12/18/20 1344     12/15/20 1700  cefTRIAXone (ROCEPHIN) 1 g in sodium chloride 0.9 % 100 mL IVPB  Status:  Discontinued        1 g 200 mL/hr over 30 Minutes Intravenous Every 24 hours 12/14/20 1648 12/15/20 0624   12/15/20 0630  ampicillin (OMNIPEN) 2 g in sodium chloride 0.9 % 100 mL IVPB        2 g 300 mL/hr over 20 Minutes Intravenous Every 6 hours 12/15/20 0624     12/15/20 0630  gentamicin (GARAMYCIN) IVPB 80 mg        80 mg 100 mL/hr over 30 Minutes Intravenous  Once 12/15/20 0628 12/15/20 0732   12/14/20 1645  cefTRIAXone (ROCEPHIN) 1 g in sodium chloride 0.9 % 100 mL IVPB        1 g 200 mL/hr over 30 Minutes Intravenous  Once 12/14/20 1642 12/14/20 1830        MEDICATIONS: Scheduled Meds:  apixaban  5 mg Oral BID   dofetilide  500 mcg Oral BID   feeding supplement (PROSource TF)  45 mL Per Tube Daily   finasteride  5 mg Oral Daily   free water  125 mL Per Tube Q4H   insulin aspart  0-9 Units Subcutaneous TID WC   mouth rinse  15 mL Mouth  Rinse BID   multivitamin with minerals  1 tablet Per Tube Daily   potassium chloride  40 mEq Per Tube BID   sodium chloride flush  3 mL Intravenous Q12H   tamsulosin  0.4 mg Oral Daily   Continuous Infusions:  sodium chloride 30 mL/hr at 12/19/20 0856   sodium chloride     ampicillin (OMNIPEN) IV 2 g (12/19/20 0901)   cefTRIAXone (ROCEPHIN)  IV 2 g (12/19/20 0906)   feeding supplement (OSMOLITE 1.2 CAL) 55 mL/hr at 12/19/20 1100   levETIRAcetam 500 mg (12/19/20 0924)   PRN Meds:.sodium chloride, sodium chloride flush   I have personally reviewed following labs and imaging studies  LABORATORY DATA: CBC: Recent Labs  Lab  12/14/20 1245 12/14/20 1504 12/15/20 0508 12/16/20 0130 12/18/20 0221 12/19/20 0610  WBC 12.0*  --  11.1* 10.7* 9.1 10.1  NEUTROABS 9.3*  --   --  7.7  --   --   HGB 11.7* 18.7* 9.8* 9.9* 8.8* 8.8*  HCT 36.8* 55.0* 31.6* 30.7* 27.3* 27.6*  MCV 90.0  --  92.4 88.0 88.1 87.9  PLT 119*  --  108* 106* 132* 176     Basic Metabolic Panel: Recent Labs  Lab 12/15/20 0508 12/16/20 0130 12/17/20 0210 12/18/20 0221 12/18/20 1210 12/18/20 1630 12/19/20 0610  NA 148* 146* 144 143  --   --  139  K 3.8 3.6 3.9 3.7  --   --  3.5  CL 119* 117* 117* 114*  --   --  108  CO2 23 22 22 24   --   --  23  GLUCOSE 144* 145* 208* 152*  --   --  186*  BUN 25* 26* 23 18  --   --  16  CREATININE 0.91 0.84 0.79 0.78  --   --  0.76  CALCIUM 8.1* 8.1* 7.7* 7.6*  --   --  7.7*  MG 2.2 2.1 2.1 2.0 1.9 1.9 2.0  PHOS  --   --   --   --  2.8 3.1 2.7     GFR: Estimated Creatinine Clearance: 78.3 mL/min (by C-G formula based on SCr of 0.76 mg/dL).  Liver Function Tests: Recent Labs  Lab 12/14/20 1245 12/15/20 0508 12/16/20 0130 12/19/20 0610  AST 63* 48* 56* 64*  ALT 92* 75* 76* 77*  ALKPHOS 68 65 66 68  BILITOT 0.5 0.7 0.5 0.4  PROT 6.8 5.8* 5.8* 5.7*  ALBUMIN 2.3* 2.0* 1.9* 1.7*    No results for input(s): LIPASE, AMYLASE in the last 168 hours. Recent Labs  Lab 12/14/20 1639  AMMONIA 25     Coagulation Profile: Recent Labs  Lab 12/14/20 1245 12/15/20 0508  INR 1.5* 1.5*     Cardiac Enzymes: No results for input(s): CKTOTAL, CKMB, CKMBINDEX, TROPONINI in the last 168 hours.  BNP (last 3 results) No results for input(s): PROBNP in the last 8760 hours.  Lipid Profile: No results for input(s): CHOL, HDL, LDLCALC, TRIG, CHOLHDL, LDLDIRECT in the last 72 hours.  Thyroid Function Tests: No results for input(s): TSH, T4TOTAL, FREET4, T3FREE, THYROIDAB in the last 72 hours.   Anemia Panel: No results for input(s): VITAMINB12, FOLATE, FERRITIN, TIBC, IRON, RETICCTPCT in the  last 72 hours.  Urine analysis:    Component Value Date/Time   COLORURINE YELLOW 12/14/2020 1600   APPEARANCEUR CLOUDY (A) 12/14/2020 1600   LABSPEC 1.020 12/14/2020 1600   PHURINE 6.0 12/14/2020 1600   GLUCOSEU NEGATIVE 12/14/2020 1600   HGBUR LARGE (  A) 12/14/2020 1600   BILIRUBINUR NEGATIVE 12/14/2020 1600   BILIRUBINUR negative 11/22/2013 0848   KETONESUR NEGATIVE 12/14/2020 1600   PROTEINUR NEGATIVE 12/14/2020 1600   UROBILINOGEN 0.2 11/22/2013 0848   UROBILINOGEN 1.0 08/05/2013 2207   NITRITE POSITIVE (A) 12/14/2020 1600   LEUKOCYTESUR LARGE (A) 12/14/2020 1600    Sepsis Labs: Lactic Acid, Venous    Component Value Date/Time   LATICACIDVEN 1.8 12/14/2020 2056    MICROBIOLOGY: Recent Results (from the past 240 hour(s))  Culture, blood (Routine x 2)     Status: Abnormal   Collection Time: 12/14/20 12:45 PM   Specimen: BLOOD RIGHT FOREARM  Result Value Ref Range Status   Specimen Description BLOOD RIGHT FOREARM  Final   Special Requests   Final    BOTTLES DRAWN AEROBIC AND ANAEROBIC Blood Culture adequate volume   Culture  Setup Time   Final    GRAM POSITIVE COCCI IN BOTH AEROBIC AND ANAEROBIC BOTTLES CRITICAL RESULT CALLED TO, READ BACK BY AND VERIFIED WITH: PHARMD JAMES LEDFORD 12/15/2020@6 :07 BY TW Performed at [facility], [place] [address], [place], [place] [address]    Culture ENTEROCOCCUS FAECALIS (A)  Final   Report Status 12/17/2020 FINAL  Final   Organism ID, Bacteria ENTEROCOCCUS FAECALIS  Final      Susceptibility   Enterococcus faecalis - MIC*    AMPICILLIN <=2 SENSITIVE Sensitive     VANCOMYCIN 2 SENSITIVE Sensitive     GENTAMICIN SYNERGY SENSITIVE Sensitive     * ENTEROCOCCUS FAECALIS  Blood Culture ID Panel (Reflexed)     Status: Abnormal   Collection Time: 12/14/20 12:45 PM  Result Value Ref Range Status   Enterococcus faecalis DETECTED (A) NOT DETECTED Final    Comment: CRITICAL RESULT CALLED TO, READ BACK BY AND VERIFIED WITH: PHARMD  JAMES LEDFORD 12/15/2020@6 :07 BY TW    Enterococcus Faecium NOT DETECTED NOT DETECTED Final   Listeria monocytogenes NOT DETECTED NOT DETECTED Final   Staphylococcus species NOT DETECTED NOT DETECTED Final   Staphylococcus aureus (BCID) NOT DETECTED NOT DETECTED Final   Staphylococcus epidermidis NOT DETECTED NOT DETECTED Final   Staphylococcus lugdunensis NOT DETECTED NOT DETECTED Final   Streptococcus species NOT DETECTED NOT DETECTED Final   Streptococcus agalactiae NOT DETECTED NOT DETECTED Final   Streptococcus pneumoniae NOT DETECTED NOT DETECTED Final   Streptococcus pyogenes NOT DETECTED NOT DETECTED Final   A.calcoaceticus-baumannii NOT DETECTED NOT DETECTED Final   Bacteroides fragilis NOT DETECTED NOT DETECTED Final   Enterobacterales NOT DETECTED NOT DETECTED Final   Enterobacter cloacae complex NOT DETECTED NOT DETECTED Final   Escherichia coli NOT DETECTED NOT DETECTED Final   Klebsiella aerogenes NOT DETECTED NOT DETECTED Final   Klebsiella oxytoca NOT DETECTED NOT DETECTED Final   Klebsiella pneumoniae NOT DETECTED NOT DETECTED Final   Proteus species NOT DETECTED NOT DETECTED Final   Salmonella species NOT DETECTED NOT DETECTED Final   Serratia marcescens NOT DETECTED NOT DETECTED Final   Haemophilus influenzae NOT DETECTED NOT DETECTED Final   Neisseria meningitidis NOT DETECTED NOT DETECTED Final   Pseudomonas aeruginosa NOT DETECTED NOT DETECTED Final   Stenotrophomonas maltophilia NOT DETECTED NOT DETECTED Final   Candida albicans NOT DETECTED NOT DETECTED Final   Candida auris NOT DETECTED NOT DETECTED Final   Candida glabrata NOT DETECTED NOT DETECTED Final   Candida krusei NOT DETECTED NOT DETECTED Final   Candida parapsilosis NOT DETECTED NOT DETECTED Final   Candida tropicalis NOT DETECTED NOT DETECTED Final   Cryptococcus neoformans/gattii  NOT DETECTED NOT DETECTED Final   Vancomycin resistance NOT DETECTED NOT DETECTED Final    Comment: Performed at  [facility], [place] [address], [place], [place] [address]  Resp Panel by RT-PCR (Flu A&B, Covid) Nasopharyngeal Swab     Status: None   Collection Time: 12/14/20  3:41 PM   Specimen: Nasopharyngeal Swab; Nasopharyngeal(NP) swabs in vial transport medium  Result Value Ref Range Status   SARS Coronavirus 2 by RT PCR NEGATIVE NEGATIVE Final    Comment: (NOTE) SARS-CoV-2 target nucleic acids are NOT DETECTED.  The SARS-CoV-2 RNA is generally detectable in upper respiratory specimens during the acute phase of infection. The lowest concentration of SARS-CoV-2 viral copies this assay can detect is 138 copies/mL. A negative result does not preclude SARS-Cov-2 infection and should not be used as the sole basis for treatment or other patient management decisions. A negative result may occur with  improper specimen collection/handling, submission of specimen other than nasopharyngeal swab, presence of viral mutation(s) within the areas targeted by this assay, and inadequate number of viral copies(<138 copies/mL). A negative result must be combined with clinical observations, patient history, and epidemiological information. The expected result is Negative.  Fact Sheet for Patients:  EntrepreneurPulse.com.au  Fact Sheet for Healthcare Providers:  IncredibleEmployment.be  This test is no t yet approved or cleared by the [place] FDA and  has been authorized for detection and/or diagnosis of SARS-CoV-2 by FDA under an Emergency Use Authorization (EUA). This EUA will remain  in effect (meaning this test can be used) for the duration of the COVID-19 declaration under Section 564(b)(1) of the Act, 21 U.S.C.section 360bbb-3(b)(1), unless the authorization is terminated  or revoked sooner.       Influenza A by PCR NEGATIVE NEGATIVE Final   Influenza B by PCR NEGATIVE NEGATIVE Final    Comment: (NOTE) The Xpert Xpress SARS-CoV-2/FLU/RSV plus assay is  intended as an aid in the diagnosis of influenza from Nasopharyngeal swab specimens and should not be used as a sole basis for treatment. Nasal washings and aspirates are unacceptable for Xpert Xpress SARS-CoV-2/FLU/RSV testing.  Fact Sheet for Patients: EntrepreneurPulse.com.au  Fact Sheet for Healthcare Providers: IncredibleEmployment.be  This test is not yet approved or cleared by the [place] FDA and has been authorized for detection and/or diagnosis of SARS-CoV-2 by FDA under an Emergency Use Authorization (EUA). This EUA will remain in effect (meaning this test can be used) for the duration of the COVID-19 declaration under Section 564(b)(1) of the Act, 21 U.S.C. section 360bbb-3(b)(1), unless the authorization is terminated or revoked.  Performed at [facility], [place] [address] [place],  [address]   Culture, blood (Routine x 2)     Status: Abnormal   Collection Time: 12/14/20  4:39 PM   Specimen: BLOOD  Result Value Ref Range Status   Specimen Description BLOOD RIGHT HAND  Final   Special Requests   Final    BOTTLES DRAWN AEROBIC AND ANAEROBIC Blood Culture results may not be optimal due to an inadequate volume of blood received in culture bottles   Culture  Setup Time   Final    GRAM POSITIVE COCCI IN CHAINS IN BOTH AEROBIC AND ANAEROBIC BOTTLES CRITICAL VALUE NOTED.  VALUE IS CONSISTENT WITH PREVIOUSLY REPORTED AND CALLED VALUE.    Culture (A)  Final    ENTEROCOCCUS FAECALIS SUSCEPTIBILITIES PERFORMED ON PREVIOUS CULTURE WITHIN THE LAST 5 DAYS. Performed at [facility], [place] [address] [place], [place]  [address]    Report Status 12/17/2020 FINAL  Final  Urine Culture     Status: Abnormal   Collection Time: 12/14/20  4:43 PM   Specimen: Urine, Clean Catch  Result Value Ref Range Status   Specimen Description URINE, CLEAN CATCH  Final   Special Requests   Final    NONE Performed at [place], [place] [address], [place], [place] [address]    Culture >=100,000 COLONIES/mL STAPHYLOCOCCUS AUREUS (A)  Final   Report Status 12/17/2020 FINAL  Final   Organism ID, Bacteria STAPHYLOCOCCUS AUREUS (A)  Final      Susceptibility   Staphylococcus aureus - MIC*    CIPROFLOXACIN <=0.5 SENSITIVE Sensitive     GENTAMICIN <=0.5 SENSITIVE Sensitive     NITROFURANTOIN 32 SENSITIVE Sensitive     OXACILLIN <=0.25 SENSITIVE Sensitive     TETRACYCLINE <=1 SENSITIVE Sensitive     VANCOMYCIN <=0.5 SENSITIVE Sensitive     TRIMETH/SULFA <=10 SENSITIVE Sensitive     CLINDAMYCIN <=0.25 SENSITIVE Sensitive     RIFAMPIN <=0.5 SENSITIVE Sensitive     Inducible Clindamycin NEGATIVE Sensitive     * >=100,000 COLONIES/mL STAPHYLOCOCCUS AUREUS  Culture, blood (routine x 2)     Status: Abnormal   Collection Time: 12/15/20 11:20 AM   Specimen: BLOOD RIGHT ARM  Result Value Ref Range Status   Specimen Description BLOOD RIGHT ARM  Final   Special Requests   Final    BOTTLES DRAWN AEROBIC AND ANAEROBIC Blood Culture adequate volume   Culture  Setup Time   Final    GRAM POSITIVE COCCI ANAEROBIC BOTTLE ONLY CRITICAL VALUE NOTED.  VALUE IS CONSISTENT WITH PREVIOUSLY REPORTED AND CALLED VALUE.    Culture (A)  Final    ENTEROCOCCUS FAECALIS SUSCEPTIBILITIES PERFORMED ON PREVIOUS CULTURE WITHIN THE LAST 5 DAYS. Performed at [facility], [place] [address] [place], [place] [address]    Report Status 12/19/2020 FINAL  Final  Culture, blood (routine x 2)     Status: None (Preliminary result)   Collection Time: 12/15/20 11:25 AM   Specimen: BLOOD LEFT ARM  Result Value Ref Range Status   Specimen Description BLOOD LEFT ARM  Final   Special Requests   Final    BOTTLES DRAWN AEROBIC ONLY Blood Culture results may not be optimal due to an inadequate volume of blood received in culture bottles   Culture   Final    NO GROWTH 4 DAYS Performed at [facility], [place] [address], [place], [place] [address]     Report Status PENDING  Incomplete  MRSA Next Gen by PCR, Nasal     Status: None   Collection Time: 12/16/20  4:34 AM   Specimen: Nasal Mucosa; Nasal Swab  Result Value Ref Range Status   MRSA by PCR Next Gen NOT DETECTED NOT DETECTED Final    Comment: (NOTE) The GeneXpert MRSA Assay (FDA approved for NASAL specimens only), is one component of a comprehensive MRSA colonization surveillance program. It is not intended to diagnose MRSA infection nor to guide or monitor treatment for MRSA infections. Test performance is not FDA approved in patients less than 53 years old. Performed at [facility], [place] [address] [place], [place] [address]   Culture, blood (routine x 2)     Status: None (Preliminary result)   Collection Time: 12/17/20  8:01 AM   Specimen: BLOOD RIGHT HAND  Result Value Ref Range Status   Specimen Description BLOOD RIGHT  HAND  Final   Special Requests   Final    BOTTLES DRAWN AEROBIC AND ANAEROBIC Blood Culture adequate volume   Culture  Setup Time   Final    GRAM POSITIVE COCCI IN CHAINS ANAEROBIC BOTTLE ONLY CRITICAL VALUE NOTED.  VALUE IS CONSISTENT WITH PREVIOUSLY REPORTED AND CALLED VALUE.    Culture   Final    GRAM POSITIVE COCCI TOO YOUNG TO READ Performed at [facility], [place] [address], [place], [place] [address]    Report Status PENDING  Incomplete  Culture, blood (routine x 2)     Status: None (Preliminary result)   Collection Time: 12/17/20  8:03 AM   Specimen: BLOOD RIGHT WRIST  Result Value Ref Range Status   Specimen Description BLOOD RIGHT WRIST  Final   Special Requests   Final    BOTTLES DRAWN AEROBIC AND ANAEROBIC Blood Culture adequate volume   Culture   Final    NO GROWTH 2 DAYS Performed at [facility], [place] [address], [place], [place] [address]    Report Status PENDING  Incomplete    RADIOLOGY STUDIES/RESULTS: MR BRAIN WO CONTRAST  Result Date: 12/18/2020 CLINICAL DATA:  Mental status change, unknown cause EXAM:  MRI HEAD WITHOUT CONTRAST TECHNIQUE: Multiplanar, multiecho pulse sequences of the brain and surrounding structures were obtained without intravenous contrast. COMPARISON:  08/29/2020 FINDINGS: Motion artifact is present. Brain: Small focus of reduced diffusion is present within the left cerebellar vermis. Diffusion hyperintensity in the region of prior right MCA infarct is probably artifactual. There is a 1.2 cm subacute hematoma of the anterior right superior frontal gyrus (series 6, image 18). Chronic right MCA territory infarction with chronic blood products. Ex vacuo dilatation of the adjacent right lateral ventricle. Chronic infarct on the left near junction of parietal, temporal, and occipital lobes also with chronic blood products. Additional confluent T2 hyperintensity in the supratentorial white matter is nonspecific but may reflect chronic microvascular ischemic changes. Disproportionate prominence of the lateral and third ventricles is unchanged. Thin extra-axial collection underlies craniotomy. Vascular: Major vessel flow voids at the skull base are preserved. Skull and upper cervical spine: Normal marrow signal is preserved. Prior right craniotomy. Sinuses/Orbits: Minor mucosal thickening. No acute orbital abnormality. Other: Sella is unremarkable.  Mastoid air cells are clear. IMPRESSION: Small acute infarct left cerebellar vermis. Small subacute hematoma anterior right frontal lobe (isodense on CT). Stable chronic findings of infarcts, volume loss, and chronic microvascular ischemic changes. Electronically Signed   By: Macy Mis M.D.   On: 12/18/2020 12:49   DG Abd Portable 1V  Result Date: 12/18/2020 CLINICAL DATA:  Feeding tube placement EXAM: PORTABLE ABDOMEN - 1 VIEW COMPARISON:  09/01/2020 FINDINGS: Feeding tube terminates at the distal stomach. Cardiomegaly with median sternotomy and left base airspace disease. Non-obstructive bowel gas pattern. No gross free intraperitoneal air.  IMPRESSION: Feeding tube terminating at the distal stomach. Electronically Signed   By: Abigail Miyamoto M.D.   On: 12/18/2020 16:44   Overnight EEG with video  Result Date: 12/18/2020 Lora Havens, MD     12/19/2020  7:36 AM Patient Name: FRAZER RAINVILLE MRN: [id number] Epilepsy Attending: Lora Havens Referring Physician/Provider: Dr Oren Binet Duration: 12/17/2020 1638 to 12/18/2020 1002  Patient history: 80 year old male with recent subdural hematoma in May, seizures who presented with altered mental status.  EEG to evaluate for seizures.  Level of alertness: Awake, asleep  AEDs during EEG study: LEV  Technical aspects: This EEG study was  done with scalp electrodes positioned according to the 10-20 International system of electrode placement. Electrical activity was acquired at a sampling rate of 500Hz  and reviewed with a high frequency filter of 70Hz  and a low frequency filter of 1Hz . EEG data were recorded continuously and digitally stored.  Description: The posterior dominant rhythm consists of 8-9 Hz activity of moderate voltage (25-35 uV) seen predominantly in posterior head regions, symmetric and reactive to eye opening and eye closing.  Sleep was characterized by vertex waves, sleep spindles (12 to 14 Hz), maximal frontocentral region.  EEG showed continuous low amplitude 2 to 3 Hz delta slowing in right frontotemporal region. Hyperventilation and photic stimulation were not performed.    ABNORMALITY - Continuous slow, right frontotemporal region  IMPRESSION: This study is suggestive of cortical dysfunction in right frontotemporal region likely secondary to underlying structural abnormality/stroke/subdural hemorrhage. No seizures or epileptiform discharges were seen throughout the recording. [place]     LOS: 4 days   Oren Binet, MD  [facility]    To contact the attending provider between 7A-7P or the covering provider during after hours 7P-7A, please log into the  web site [email or web address] and access using universal [place] password for that web site. If you do not have the password, please call the hospital operator.  12/19/2020, 2:21 PM

## 2020-12-19 NOTE — Progress Notes (Addendum)
[facility] Infectious Diseases Follow Up Note  Patient Identification: Patient Name: [name] [name] MRN: [id number] [place] Date: 12/14/2020 12:11 PM Age: 80 y.o.Today's Date: 12/19/2020   Reason for Visit: E faecalis endocarditis   Active Problems:   Pressure injury of skin   Hypernatremia   Bacteremia   S/P MVR (mitral valve repair)   History of CVA in adulthood   Protein-calorie malnutrition, severe   Antibiotics:  Ceftriaxone 10/10 Gentamicin 10/11 Ampicillin 10/11-current  Ceftriaxone 10/14-current   Lines/Hardwares: Bioprosthetic AV and MV   Interval Events: afebrile, no leukocytosis. 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 Neurology has been consulted. EEG negative for seizures  Urine cx growing MSSA, in the setting of chronic straight cath and negative blood cultures for MSSA   Recommendations Continue Ampicillin and ceftriaxone as is Repeat blood cx 2 sets today ordered Monitor CBC and CMP  Agree with GOC discussion  Fu Neurology recs  Rest of the management as per the primary team. Thank you for the consult. Please page with pertinent questions or concerns.  ______________________________________________________________________ Subjective patient seen and examined at the bedside. Sitting up in bed, he opens his eyes does not respond otherwise.    Vitals BP 108/64 (BP Location: Right Arm)   Pulse 71   Temp 98.1 F (36.7 C) (Axillary)   Resp 18   Ht 5\' 7"  (1.702 m)   Wt 82.6 kg   SpO2 96%   BMI 28.52 kg/m     Physical Exam Constitutional:  sitting up in  bed with eyes closed     Comments:   Cardiovascular:     Rate and Rhythm: Normal rate and regular rhythm.     Heart sounds:systolic murmur+  Pulmonary:     Effort: Pulmonary effort is normal.     Comments: on room air, bilateral equal air entry   Abdominal:     Palpations: Abdomen is soft.     Tenderness: Non tender and non distended   Musculoskeletal:        General: No swelling or tenderness. Back not examined   Skin:    Comments: No obvious lesions or rashes   Neurological:     General: unable to assess  Psychiatric:        Mood and Affect: unable to assess.   Pertinent Microbiology Results for orders placed or performed during the hospital encounter of 12/14/20  Culture, blood (Routine x 2)     Status: Abnormal   Collection Time: 12/14/20 12:45 PM   Specimen: BLOOD RIGHT FOREARM  Result Value Ref Range Status   Specimen Description BLOOD RIGHT FOREARM  Final   Special Requests   Final    BOTTLES DRAWN AEROBIC AND ANAEROBIC Blood Culture adequate volume   Culture  Setup Time   Final    GRAM POSITIVE COCCI IN BOTH AEROBIC AND ANAEROBIC BOTTLES CRITICAL RESULT CALLED TO, READ BACK BY AND VERIFIED WITH: PHARMD JAMES LEDFORD 12/15/2020@6 :07 BY TW Performed at [facility], [place] [address] [place], [place] [address]    Culture ENTEROCOCCUS FAECALIS (A)  Final   Report Status 12/17/2020 FINAL  Final  Organism ID, Bacteria ENTEROCOCCUS FAECALIS  Final      Susceptibility   Enterococcus faecalis - MIC*    AMPICILLIN <=2 SENSITIVE Sensitive     VANCOMYCIN 2 SENSITIVE Sensitive     GENTAMICIN SYNERGY SENSITIVE Sensitive     * ENTEROCOCCUS FAECALIS  Blood Culture ID Panel (Reflexed)     Status: Abnormal   Collection Time: 12/14/20 12:45 PM  Result Value Ref Range Status   Enterococcus faecalis DETECTED (A) NOT DETECTED Final    Comment: CRITICAL RESULT CALLED TO, READ BACK BY AND VERIFIED WITH: PHARMD JAMES LEDFORD 12/15/2020@6 :07 BY TW    Enterococcus  Faecium NOT DETECTED NOT DETECTED Final   Listeria monocytogenes NOT DETECTED NOT DETECTED Final   Staphylococcus species NOT DETECTED NOT DETECTED Final   Staphylococcus aureus (BCID) NOT DETECTED NOT DETECTED Final   Staphylococcus epidermidis NOT DETECTED NOT DETECTED Final   Staphylococcus lugdunensis NOT DETECTED NOT DETECTED Final   Streptococcus species NOT DETECTED NOT DETECTED Final   Streptococcus agalactiae NOT DETECTED NOT DETECTED Final   Streptococcus pneumoniae NOT DETECTED NOT DETECTED Final   Streptococcus pyogenes NOT DETECTED NOT DETECTED Final   A.calcoaceticus-baumannii NOT DETECTED NOT DETECTED Final   Bacteroides fragilis NOT DETECTED NOT DETECTED Final   Enterobacterales NOT DETECTED NOT DETECTED Final   Enterobacter cloacae complex NOT DETECTED NOT DETECTED Final   Escherichia coli NOT DETECTED NOT DETECTED Final   Klebsiella aerogenes NOT DETECTED NOT DETECTED Final   Klebsiella oxytoca NOT DETECTED NOT DETECTED Final   Klebsiella pneumoniae NOT DETECTED NOT DETECTED Final   Proteus species NOT DETECTED NOT DETECTED Final   Salmonella species NOT DETECTED NOT DETECTED Final   Serratia marcescens NOT DETECTED NOT DETECTED Final   Haemophilus influenzae NOT DETECTED NOT DETECTED Final   Neisseria meningitidis NOT DETECTED NOT DETECTED Final   Pseudomonas aeruginosa NOT DETECTED NOT DETECTED Final   Stenotrophomonas maltophilia NOT DETECTED NOT DETECTED Final   Candida albicans NOT DETECTED NOT DETECTED Final   Candida auris NOT DETECTED NOT DETECTED Final   Candida glabrata NOT DETECTED NOT DETECTED Final   Candida krusei NOT DETECTED NOT DETECTED Final   Candida parapsilosis NOT DETECTED NOT DETECTED Final   Candida tropicalis NOT DETECTED NOT DETECTED Final   Cryptococcus neoformans/gattii NOT DETECTED NOT DETECTED Final   Vancomycin resistance NOT DETECTED NOT DETECTED Final    Comment: Performed at [facility], [address], [place], [place]  [address]  Resp Panel by RT-PCR (Flu A&B, Covid) Nasopharyngeal Swab     Status: None   Collection Time: 12/14/20  3:41 PM   Specimen: Nasopharyngeal Swab; Nasopharyngeal(NP) swabs in vial transport medium  Result Value Ref Range Status   SARS Coronavirus 2 by RT PCR NEGATIVE NEGATIVE Final    Comment: (NOTE) SARS-CoV-2 target nucleic acids are NOT DETECTED.  The SARS-CoV-2 RNA is generally detectable in upper respiratory specimens during the acute phase of infection. The lowest concentration of SARS-CoV-2 viral copies this assay can detect is 138 copies/mL. A negative result does not preclude SARS-Cov-2 infection and should not be used as the sole basis for treatment or other patient management decisions. A negative result may occur with  improper specimen collection/handling, submission of specimen other than nasopharyngeal swab, presence of viral mutation(s) within the areas targeted by this assay, and inadequate number of viral copies(<138 copies/mL). A negative result must be combined with clinical observations, patient history, and epidemiological information. The expected result is Negative.  Fact Sheet for Patients:  EntrepreneurPulse.com.au  Fact Sheet for Healthcare Providers:  IncredibleEmployment.be  This test is no t yet approved or cleared by the [place] FDA and  has been authorized for detection and/or diagnosis of SARS-CoV-2 by FDA under an Emergency Use Authorization (EUA). This EUA will remain  in effect (meaning this test can be used) for the duration of the COVID-19 declaration under Section 564(b)(1) of the Act, 21 U.S.C.section 360bbb-3(b)(1), unless the authorization is terminated  or revoked sooner.       Influenza A by PCR NEGATIVE NEGATIVE Final   Influenza B by PCR NEGATIVE NEGATIVE Final    Comment: (NOTE) The Xpert Xpress SARS-CoV-2/FLU/RSV plus assay is intended as an aid in the diagnosis of influenza from  Nasopharyngeal swab specimens and should not be used as a sole basis for treatment. Nasal washings and aspirates are unacceptable for Xpert Xpress SARS-CoV-2/FLU/RSV testing.  Fact Sheet for Patients: EntrepreneurPulse.com.au  Fact Sheet for Healthcare Providers: IncredibleEmployment.be  This test is not yet approved or cleared by the [place] FDA and has been authorized for detection and/or diagnosis of SARS-CoV-2 by FDA under an Emergency Use Authorization (EUA). This EUA will remain in effect (meaning this test can be used) for the duration of the COVID-19 declaration under Section 564(b)(1) of the Act, 21 U.S.C. section 360bbb-3(b)(1), unless the authorization is terminated or revoked.  Performed at [facility], [place] [address] [place], [place] [address]   Culture, blood (Routine x 2)     Status: Abnormal   Collection Time: 12/14/20  4:39 PM   Specimen: BLOOD  Result Value Ref Range Status   Specimen Description BLOOD RIGHT HAND  Final   Special Requests   Final    BOTTLES DRAWN AEROBIC AND ANAEROBIC Blood Culture results may not be optimal due to an inadequate volume of blood received in culture bottles   Culture  Setup Time   Final    GRAM POSITIVE COCCI IN CHAINS IN BOTH AEROBIC AND ANAEROBIC BOTTLES CRITICAL VALUE NOTED.  VALUE IS CONSISTENT WITH PREVIOUSLY REPORTED AND CALLED VALUE.    Culture (A)  Final    ENTEROCOCCUS FAECALIS SUSCEPTIBILITIES PERFORMED ON PREVIOUS CULTURE WITHIN THE LAST 5 DAYS. Performed at [facility], [place] [address], [place], [place] [address]    Report Status 12/17/2020 FINAL  Final  Urine Culture     Status: Abnormal   Collection Time: 12/14/20  4:43 PM   Specimen: Urine, Clean Catch  Result Value Ref Range Status   Specimen Description URINE, CLEAN CATCH  Final   Special Requests   Final    NONE Performed at [facility], [place] [address] [place], [place] [address]    Culture  >=100,000 COLONIES/mL STAPHYLOCOCCUS AUREUS (A)  Final   Report Status 12/17/2020 FINAL  Final   Organism ID, Bacteria STAPHYLOCOCCUS AUREUS (A)  Final      Susceptibility   Staphylococcus aureus - MIC*    CIPROFLOXACIN <=0.5 SENSITIVE Sensitive     GENTAMICIN <=0.5 SENSITIVE Sensitive     NITROFURANTOIN 32 SENSITIVE Sensitive     OXACILLIN <=0.25 SENSITIVE Sensitive     TETRACYCLINE <=1 SENSITIVE Sensitive     VANCOMYCIN <=0.5 SENSITIVE Sensitive     TRIMETH/SULFA <=10 SENSITIVE Sensitive     CLINDAMYCIN <=0.25 SENSITIVE Sensitive     RIFAMPIN <=0.5 SENSITIVE Sensitive     Inducible Clindamycin NEGATIVE Sensitive     * >=100,000 COLONIES/mL STAPHYLOCOCCUS AUREUS  Culture, blood (routine x 2)     Status:  Abnormal (Preliminary result)   Collection Time: 12/15/20 11:20 AM   Specimen: BLOOD RIGHT ARM  Result Value Ref Range Status   Specimen Description BLOOD RIGHT ARM  Final   Special Requests   Final    BOTTLES DRAWN AEROBIC AND ANAEROBIC Blood Culture adequate volume   Culture  Setup Time   Final    GRAM POSITIVE COCCI ANAEROBIC BOTTLE ONLY CRITICAL VALUE NOTED.  VALUE IS CONSISTENT WITH PREVIOUSLY REPORTED AND CALLED VALUE.    Culture (A)  Final    ENTEROCOCCUS FAECALIS SUSCEPTIBILITIES PERFORMED ON PREVIOUS CULTURE WITHIN THE LAST 5 DAYS. Performed at [facility], [place] [address] [place], [place] [address]    Report Status PENDING  Incomplete  Culture, blood (routine x 2)     Status: None (Preliminary result)   Collection Time: 12/15/20 11:25 AM   Specimen: BLOOD LEFT ARM  Result Value Ref Range Status   Specimen Description BLOOD LEFT ARM  Final   Special Requests   Final    BOTTLES DRAWN AEROBIC ONLY Blood Culture results may not be optimal due to an inadequate volume of blood received in culture bottles   Culture   Final    NO GROWTH 4 DAYS Performed at [facility], [place] [address], [place], [place] [address]    Report Status PENDING  Incomplete  MRSA  Next Gen by PCR, Nasal     Status: None   Collection Time: 12/16/20  4:34 AM   Specimen: Nasal Mucosa; Nasal Swab  Result Value Ref Range Status   MRSA by PCR Next Gen NOT DETECTED NOT DETECTED Final    Comment: (NOTE) The GeneXpert MRSA Assay (FDA approved for NASAL specimens only), is one component of a comprehensive MRSA colonization surveillance program. It is not intended to diagnose MRSA infection nor to guide or monitor treatment for MRSA infections. Test performance is not FDA approved in patients less than 45 years old. Performed at [facility], [place] [address], [place], [place] [address]   Culture, blood (routine x 2)     Status: None (Preliminary result)   Collection Time: 12/17/20  8:01 AM   Specimen: BLOOD RIGHT HAND  Result Value Ref Range Status   Specimen Description BLOOD RIGHT HAND  Final   Special Requests   Final    BOTTLES DRAWN AEROBIC AND ANAEROBIC Blood Culture adequate volume   Culture  Setup Time   Final    GRAM POSITIVE COCCI IN CHAINS ANAEROBIC BOTTLE ONLY CRITICAL VALUE NOTED.  VALUE IS CONSISTENT WITH PREVIOUSLY REPORTED AND CALLED VALUE.    Culture   Final    NO GROWTH 2 DAYS Performed at [facility], [place] [address], [place], [place] [address]    Report Status PENDING  Incomplete  Culture, blood (routine x 2)     Status: None (Preliminary result)   Collection Time: 12/17/20  8:03 AM   Specimen: BLOOD RIGHT WRIST  Result Value Ref Range Status   Specimen Description BLOOD RIGHT WRIST  Final   Special Requests   Final    BOTTLES DRAWN AEROBIC AND ANAEROBIC Blood Culture adequate volume   Culture   Final    NO GROWTH 2 DAYS Performed at [facility], [place] [address] [place], [place] [address]    Report Status PENDING  Incomplete     Pertinent Lab. CBC Latest Ref Rng & Units 12/19/2020 12/18/2020 12/16/2020  WBC 4.0 - 10.5 K/uL 10.1 9.1 10.7(H)  Hemoglobin  13.0 - 17.0 g/dL 8.8(L) 8.8(L) 9.9(L)  Hematocrit 39.0 - 52.0 %  27.6(L) 27.3(L) 30.7(L)  Platelets 150 - 400 K/uL 176 132(L) 106(L)   CMP Latest Ref Rng & Units 12/19/2020 12/18/2020 12/17/2020  Glucose 70 - 99 mg/dL 186(H) 152(H) 208(H)  BUN 8 - 23 mg/dL 16 18 23   Creatinine 0.61 - 1.24 mg/dL 0.76 0.78 0.79  Sodium 135 - 145 mmol/L 139 143 144  Potassium 3.5 - 5.1 mmol/L 3.5 3.7 3.9  Chloride 98 - 111 mmol/L 108 114(H) 117(H)  CO2 22 - 32 mmol/L 23 24 22   Calcium 8.9 - 10.3 mg/dL 7.7(L) 7.6(L) 7.7(L)  Total Protein 6.5 - 8.1 g/dL 5.7(L) - -  Total Bilirubin 0.3 - 1.2 mg/dL 0.4 - -  Alkaline Phos 38 - 126 U/L 68 - -  AST 15 - 41 U/L 64(H) - -  ALT 0 - 44 U/L 77(H) - -     Pertinent Imaging today Plain films and CT images have been personally visualized and interpreted; radiology reports have been reviewed. Decision making incorporated into the Impression / Recommendations.  TEE 12/18/20 preliminary  Normal LV size with EF 60-65%, normal wall motion and normal wall thickness . The RV was not well-visualized but probably normal size and systolic function.  Mild left atrial enlargement.  The LA appendage appeared to be partially oversewn but still connects to LA, no thrombus.  Normal right atrium.  No PFO or ASD by color doppler.  Trivial TR, no TV vegetation.  No PV vegetation.  Bioprosthetic aortic valve with mean gradient 14, trivial regurgitation.  No vegetation noted.  There was a bioprosthetic mitral valve with trivial regurgitation, mean gradient 8 mmHg.  There was vegetation on the sewing ring and also on the leaflets (relatively small size).     Impression: Prosthetic mitral valve endocarditis.  There is only trivial mitral regurgitation.   I spent more than 35 minutes for this patient encounter including review of prior medical records, coordination of care  with greater than 50% of time being face to face/counseling and discussing diagnostics/treatment plan with the patient/family.  Electronically signed by:   Rosiland Oz,  MD Infectious Disease Physician [facility] for Infectious Disease Pager: [phone]

## 2020-12-19 NOTE — Progress Notes (Signed)
STROKE TEAM PROGRESS NOTE   SUBJECTIVE (INTERVAL HISTORY) His wife and son are is at the bedside, another son is on the phone. Pt lying in bed, barely open eyes on voice, not following commands, no interaction.  Left hemiplegia, right arm and leg limited movement.  TEE showed small mitral wall vegetation, MRI showed small left AICA infarct.   OBJECTIVE Temp:  [97.6 F (36.4 C)-98.6 F (37 C)] 97.6 F (36.4 C) (10/15 1200) Pulse Rate:  [71-76] 76 (10/15 1600) Cardiac Rhythm: Heart block (10/15 0804) Resp:  [16-26] 26 (10/15 1600) BP: (100-139)/(64-78) 109/76 (10/15 1600) SpO2:  [96 %-99 %] 97 % (10/15 1600) Weight:  [82.6 kg] 82.6 kg (10/15 0343)  Recent Labs  Lab 12/18/20 2320 12/19/20 0342 12/19/20 0754 12/19/20 1209 12/19/20 1707  GLUCAP 153* 203* 182* 140* 169*   Recent Labs  Lab 12/15/20 0508 12/16/20 0130 12/17/20 0210 12/18/20 0221 12/18/20 1210 12/18/20 1630 12/19/20 0610 12/19/20 1632  NA 148* 146* 144 143  --   --  139  --   K 3.8 3.6 3.9 3.7  --   --  3.5  --   CL 119* 117* 117* 114*  --   --  108  --   CO2 23 22 22 24   --   --  23  --   GLUCOSE 144* 145* 208* 152*  --   --  186*  --   BUN 25* 26* 23 18  --   --  16  --   CREATININE 0.91 0.84 0.79 0.78  --   --  0.76  --   CALCIUM 8.1* 8.1* 7.7* 7.6*  --   --  7.7*  --   MG 2.2 2.1 2.1 2.0 1.9 1.9 2.0 1.9  PHOS  --   --   --   --  2.8 3.1 2.7 2.4*   Recent Labs  Lab 12/14/20 1245 12/15/20 0508 12/16/20 0130 12/19/20 0610  AST 63* 48* 56* 64*  ALT 92* 75* 76* 77*  ALKPHOS 68 65 66 68  BILITOT 0.5 0.7 0.5 0.4  PROT 6.8 5.8* 5.8* 5.7*  ALBUMIN 2.3* 2.0* 1.9* 1.7*   Recent Labs  Lab 12/14/20 1245 12/14/20 1504 12/15/20 0508 12/16/20 0130 12/18/20 0221 12/19/20 0610  WBC 12.0*  --  11.1* 10.7* 9.1 10.1  NEUTROABS 9.3*  --   --  7.7  --   --   HGB 11.7* 18.7* 9.8* 9.9* 8.8* 8.8*  HCT 36.8* 55.0* 31.6* 30.7* 27.3* 27.6*  MCV 90.0  --  92.4 88.0 88.1 87.9  PLT 119*  --  108* 106* 132* 176    No results for input(s): CKTOTAL, CKMB, CKMBINDEX, TROPONINI in the last 168 hours. No results for input(s): LABPROT, INR in the last 72 hours. No results for input(s): COLORURINE, LABSPEC, [place], GLUCOSEU, HGBUR, BILIRUBINUR, KETONESUR, PROTEINUR, UROBILINOGEN, NITRITE, LEUKOCYTESUR in the last 72 hours.  Invalid input(s): APPERANCEUR     Component Value Date/Time   CHOL 119 08/08/2020 0625   CHOL 102 11/26/2013 0000   TRIG 105 08/08/2020 0625   TRIG 93 11/26/2013 0000   HDL 42 08/08/2020 0625   CHOLHDL 2.8 08/08/2020 0625   VLDL 21 08/08/2020 0625   LDLCALC 56 08/08/2020 0625   LDLCALC 58 11/26/2013 0000   Lab Results  Component Value Date   HGBA1C 7.3 (H) 12/17/2020      Component Value Date/Time   LABOPIA NONE DETECTED 08/05/2013 2207   [place] DETECTED 08/05/2013 2207   LABBENZ  NONE DETECTED 08/05/2013 2207   AMPHETMU NONE DETECTED 08/05/2013 2207   THCU NONE DETECTED 08/05/2013 2207   LABBARB NONE DETECTED 08/05/2013 2207    No results for input(s): ETH in the last 168 hours.  I have personally reviewed the radiological images below and agree with the radiology interpretations.  CT Head Wo Contrast  Result Date: 12/14/2020 CLINICAL DATA:  Mental status change, unknown cause EXAM: CT HEAD WITHOUT CONTRAST TECHNIQUE: Contiguous axial images were obtained from the base of the skull through the vertex without intravenous contrast. 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 Old left parietal infarct. Findings are stable since prior study. Small right subdural hematoma again noted overlying the right frontoparietal lobe, 3 mm compared to 5 mm previously. There is atrophy and chronic small vessel disease changes. Ventriculomegaly likely rib related to ex vacuo dilatation. No acute infarct or acute hemorrhage. Vascular: No hyperdense vessel or unexpected calcification. Skull: Prior right temporoparietal craniotomy. No acute  calvarial abnormality. Sinuses/Orbits: No acute findings Other: None IMPRESSION: Small right subdural hematoma, 3 mm compared with 5 mm previously. No new hemorrhage. Old right MCA and left parietal infarcts, stable. Ventriculomegaly related to ex vacuo dilatation. Atrophy, chronic microvascular disease. No acute intracranial abnormality. Electronically Signed   By: Rolm Baptise M.D.   On: 12/14/2020 13:50   MR BRAIN WO CONTRAST  Result Date: 12/18/2020 CLINICAL DATA:  Mental status change, unknown cause EXAM: MRI HEAD WITHOUT CONTRAST TECHNIQUE: Multiplanar, multiecho pulse sequences of the brain and surrounding structures were obtained without intravenous contrast. COMPARISON:  08/29/2020 FINDINGS: Motion artifact is present. Brain: Small focus of reduced diffusion is present within the left cerebellar vermis. Diffusion hyperintensity in the region of prior right MCA infarct is probably artifactual. There is a 1.2 cm subacute hematoma of the anterior right superior frontal gyrus (series 6, image 18). Chronic right MCA territory infarction with chronic blood products. Ex vacuo dilatation of the adjacent right lateral ventricle. Chronic infarct on the left near junction of parietal, temporal, and occipital lobes also with chronic blood products. Additional confluent T2 hyperintensity in the supratentorial white matter is nonspecific but may reflect chronic microvascular ischemic changes. Disproportionate prominence of the lateral and third ventricles is unchanged. Thin extra-axial collection underlies craniotomy. Vascular: Major vessel flow voids at the skull base are preserved. Skull and upper cervical spine: Normal marrow signal is preserved. Prior right craniotomy. Sinuses/Orbits: Minor mucosal thickening. No acute orbital abnormality. Other: Sella is unremarkable.  Mastoid air cells are clear. IMPRESSION: Small acute infarct left cerebellar vermis. Small subacute hematoma anterior right frontal lobe  (isodense on CT). Stable chronic findings of infarcts, volume loss, and chronic microvascular ischemic changes. Electronically Signed   By: Macy Mis M.D.   On: 12/18/2020 12:49   DG Chest Port 1 View  Result Date: 12/14/2020 CLINICAL DATA:  Altered mental status EXAM: PORTABLE CHEST 1 VIEW COMPARISON:  Chest radiograph 08/07/2020 FINDINGS: Median sternotomy wires and mitral and aortic valve prostheses are stable. The cardiomediastinal silhouette is stable. There is calcified atherosclerotic plaque of the aortic arch. Lung volumes are low. There is no focal consolidation or pulmonary edema. There is no pleural effusion or pneumothorax. There is no acute osseous abnormality. IMPRESSION: Low lung volumes. Otherwise, no radiographic evidence of acute cardiopulmonary process. Electronically Signed   By: Valetta Mole M.D.   On: 12/14/2020 13:01   DG Abd Portable 1V  Result Date: 12/18/2020 CLINICAL DATA:  Feeding tube placement EXAM: PORTABLE ABDOMEN - 1  VIEW COMPARISON:  09/01/2020 FINDINGS: Feeding tube terminates at the distal stomach. Cardiomegaly with median sternotomy and left base airspace disease. Non-obstructive bowel gas pattern. No gross free intraperitoneal air. IMPRESSION: Feeding tube terminating at the distal stomach. Electronically Signed   By: Abigail Miyamoto M.D.   On: 12/18/2020 16:44   EEG adult  Result Date: 12/17/2020 Lora Havens, MD     12/17/2020  1:00 PM Patient Name: DIAR BERKEL MRN: [id number] Epilepsy Attending: Lora Havens Referring Physician/Provider: Dr Oren Binet Date: 12/17/2020 Duration: 24.04 mins Patient history: 80 year old male with recent subdural hematoma in May, seizures who presented with altered mental status.  EEG to evaluate for seizures. Level of alertness: Awake AEDs during EEG study: LEV Technical aspects: This EEG study was done with scalp electrodes positioned according to the 10-20 International system of electrode placement. Electrical  activity was acquired at a sampling rate of 500Hz  and reviewed with a high frequency filter of 70Hz  and a low frequency filter of 1Hz . EEG data were recorded continuously and digitally stored. Description: The posterior dominant rhythm consists of 8-9 Hz activity of moderate voltage (25-35 uV) seen predominantly in posterior head regions, symmetric and reactive to eye opening and eye closing. EEG showed continuous low amplitude 2 to 3 Hz delta slowing in right frontotemporal region. Intermittent generalized 3 to 5 Hz theta-delta slowing was also noted. Hyperventilation and photic stimulation were not performed.   ABNORMALITY - Continuous slow, right frontotemporal region - Intermittent slow, generalized IMPRESSION: This study is suggestive of cortical dysfunction in right frontotemporal region likely secondary to underlying structural abnormality/stroke/subdural hemorrhage.  Additionally there is mild diffuse encephalopathy, nonspecific etiology.  No seizures or epileptiform discharges were seen throughout the recording.  If suspicion for ictal- interictal activity remains a concern, a prolonged study can be considered. Priyanka Barbra Sarks   Overnight EEG with video  Result Date: 12/18/2020 Lora Havens, MD     12/19/2020  7:36 AM Patient Name: ETHELBERT THAIN MRN: [id number] Epilepsy Attending: Lora Havens Referring Physician/Provider: Dr Oren Binet Duration: 12/17/2020 1638 to 12/18/2020 1002  Patient history: 80 year old male with recent subdural hematoma in May, seizures who presented with altered mental status.  EEG to evaluate for seizures.  Level of alertness: Awake, asleep  AEDs during EEG study: LEV  Technical aspects: This EEG study was done with scalp electrodes positioned according to the 10-20 International system of electrode placement. Electrical activity was acquired at a sampling rate of 500Hz  and reviewed with a high frequency filter of 70Hz  and a low frequency filter of 1Hz . EEG data  were recorded continuously and digitally stored.  Description: The posterior dominant rhythm consists of 8-9 Hz activity of moderate voltage (25-35 uV) seen predominantly in posterior head regions, symmetric and reactive to eye opening and eye closing.  Sleep was characterized by vertex waves, sleep spindles (12 to 14 Hz), maximal frontocentral region.  EEG showed continuous low amplitude 2 to 3 Hz delta slowing in right frontotemporal region. Hyperventilation and photic stimulation were not performed.    ABNORMALITY - Continuous slow, right frontotemporal region  IMPRESSION: This study is suggestive of cortical dysfunction in right frontotemporal region likely secondary to underlying structural abnormality/stroke/subdural hemorrhage. No seizures or epileptiform discharges were seen throughout the recording. Lora Havens   ECHOCARDIOGRAM COMPLETE  Result Date: 12/15/2020    ECHOCARDIOGRAM REPORT   Patient Name:   Ell YUVAAN OLANDER Date of Exam: 12/15/2020 Medical Rec #:  [id number]  Height:       67.0 in Accession #:    [id number]    Weight:       166.2 lb Date of Birth:  1940/06/27     BSA:          1.869 m Patient Age:    64 years      BP:           120/67 mmHg Patient Gender: M             HR:           98 bpm. Exam Location:  Inpatient Procedure: 2D Echo, Cardiac Doppler, Color Doppler and Intracardiac            Opacification Agent Indications:    Fever R50.9  History:        Patient has prior history of Echocardiogram examinations, most                 recent 08/07/2020. Stroke; Arrythmias:Atrial Fibrillation. Past                 history of rheumatic fever, acute rheumatic pericarditis.                 Thoracic aortic aneurysm.                 Aortic Valve: 27 mm Carpentier-Edwards prosthetic valve is                 present in the aortic position. Procedure Date: 10/18/2004.  Sonographer:    Darlina Sicilian RDCS Sonographer#2:  Darlina Sicilian RDCS Referring Phys: [id number] [place]   1. The mitral valve has been replaced with an unknown bioprosthesis. No evidence of mitral valve regurgitation. The mean mitral valve gradient is 10.0 mmHg with average heart rate of 101 bpm. PHT 70 ms. Suboptimal LVOT spectral Doppler- VTI deferred. Date of Procedure Date: 10/18/2004.  2. The aortic valve has been repaired/replaced. Aortic valve regurgitation is not visualized. 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 Procedure Date: 10/18/2004. Aortic valve mean gradient measures 16.0 mmHg.  Aortic valve acceleration time measures 74 msec.  3. Left ventricular ejection fraction, by estimation, is 65 to 70%. The left ventricle has normal function. The left ventricle has no regional wall motion abnormalities. There is mild concentric left ventricular hypertrophy. Left ventricular diastolic parameters are indeterminate.  4. Aortic dilatation noted. 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  5. Right ventricular systolic function is normal. The right ventricular size is normal. Comparison(s): A prior study was performed on 08/07/2020. Prior images reviewed side by side. Stable aortic root and ascending aorta size. Increased mitral valve gradients, but at higher hear rates from prior. FINDINGS  Left Ventricle: Left ventricular ejection fraction, by estimation, is 65 to 70%. The left ventricle has normal function. The left ventricle has no regional wall motion abnormalities. Definity contrast agent was given IV to delineate the left ventricular  endocardial borders. The left ventricular internal cavity size was normal in size. There is mild concentric left ventricular hypertrophy. Left ventricular diastolic parameters are indeterminate. Right Ventricle: The right ventricular size is normal. No increase in right ventricular wall thickness. Right ventricular systolic function is normal.  Left Atrium: Left atrial size was normal in size. Right Atrium: Right atrial size was normal in size. Pericardium: There is no evidence of  pericardial effusion. Mitral Valve: The mitral valve has been repaired/replaced. No evidence of mitral valve regurgitation. There is a 29 mm Carpentier-Edwards prosthetic present in the mitral position. MV peak gradient, 17.1 mmHg. The mean mitral valve gradient is 10.0 mmHg with average heart rate of 101 bpm. Tricuspid Valve: The tricuspid valve is grossly normal. Tricuspid valve regurgitation is mild . No evidence of tricuspid stenosis. Aortic Valve: The aortic valve has been repaired/replaced. Aortic valve regurgitation is not visualized. Aortic valve mean gradient measures 16.0 mmHg. Aortic valve peak gradient measures 24.4 mmHg. Aortic valve area, by VTI measures 1.56 cm. There is a  27 mm Carpentier-Edwards prosthetic valve present in the aortic position. Procedure Date: 10/18/2004. Pulmonic Valve: The pulmonic valve was normal in structure. Pulmonic valve regurgitation is mild. No evidence of pulmonic stenosis. Aorta: The ascending aorta was not well visualized and aortic dilatation noted. 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 IAS/Shunts: The atrial septum is grossly normal.  LEFT VENTRICLE PLAX 2D LVIDd:         4.80 cm LVIDs:         3.50 cm LV PW:         1.30 cm LV IVS:        1.30 cm LVOT diam:     3.25 cm LV SV:         62 LV SV Index:   33 LVOT Area:     8.30 cm  LV Volumes (MOD) LV vol d, MOD A2C: 81.2 ml LV vol d, MOD A4C: 94.4 ml LV vol s, MOD A2C: 18.0 ml LV vol s, MOD A4C: 30.1 ml LV SV MOD A2C:     63.2 ml LV SV MOD A4C:     94.4 ml LV SV MOD BP:      63.9 ml RIGHT VENTRICLE RV S prime:     10.40 cm/s LEFT ATRIUM             Index        RIGHT ATRIUM          Index LA diam:        4.50 cm 2.41 cm/m   RA Area:     8.74 cm LA Vol (A2C):   58.1 ml 31.08 ml/m  RA  Volume:   12.60 ml 6.74 ml/m LA Vol (A4C):   56.9 ml 30.44 ml/m LA Biplane Vol: 58.9 ml 31.51 ml/m  AORTIC VALVE AV Area (Vmax):    5.29 cm AV Area (Vmean):   1.55 cm AV Area (VTI):     1.56 cm AV Vmax:           247.00 cm/s AV Vmean:          190.500 cm/s AV VTI:            0.401 m AV Peak Grad:      24.4 mmHg AV Mean Grad:      16.0 mmHg LVOT Vmax:         157.40 cm/s LVOT Vmean:        35.700 cm/s LVOT VTI:          0.075 m LVOT/AV VTI ratio: 0.19  AORTA Ao Asc diam: 4.90 cm MITRAL VALVE                TRICUSPID VALVE MV Area (PHT): 3.16 cm     TR Peak grad:   26.4 mmHg  MV Area VTI:   1.39 cm     TR Vmax:        257.00 cm/s MV Peak grad:  17.1 mmHg MV Mean grad:  10.0 mmHg    SHUNTS MV Vmax:       2.07 m/s     Systemic VTI:  0.08 m MV Vmean:      151.0 cm/s   Systemic Diam: 3.25 cm MV Decel Time: 240 msec MV E velocity: 195.00 cm/s Rudean Haskell MD Electronically signed by Rudean Haskell MD Signature Date/Time: 12/15/2020/12:49:20 PM    Final       PHYSICAL EXAM  Temp:  [97.6 F (36.4 C)-98.6 F (37 C)] 97.6 F (36.4 C) (10/15 1200) Pulse Rate:  [71-76] 76 (10/15 1600) Resp:  [16-26] 26 (10/15 1600) BP: (100-139)/(64-78) 109/76 (10/15 1600) SpO2:  [96 %-99 %] 97 % (10/15 1600) Weight:  [82.6 kg] 82.6 kg (10/15 0343)  General - Well nourished, well developed, in no apparent distress.  Ophthalmologic - fundi not visualized due to noncooperation.  Cardiovascular - Regular rhythm and rate, not in afib.  Neuro - eyes closed, with repetitive stimulation, he was able to open eyes and able to maintain eyes open for several minutes.  However, nonverbal, not follow commands.  Able to blinking spontaneous, however not blinking to visual threat bilaterally.  No tracking.  Not interactive.  Mild left facial droop.  Tongue protrusion not corporative.  Left upper and lower extremity hemiplegia except left lower extremity slight withdraw with pain stimulation.  Right upper extremity  localizing to pain, purposeful barely against gravity.  Right lower extremity no spontaneous movement, however withdrawal to pain 2/5. Sensation, coordination and gait not tested.   ASSESSMENT/PLAN Mr. CHAYSE ZATARAIN is a 80 y.o. 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 fib status post maze procedure and LAA clipping, stroke admitted for lethargy, weight loss, bacteremia and altered mental status. No tPA given due to outside window.    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   Stroke:  left small AICA infarct embolic pattern, 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  Ongoing aggressive stroke risk factor management Therapy recommendations: Pending Disposition: Pending  Bacteremia Endocarditis UTI ID on board TEE showed small mitral wall vegetation Blood culture positive for Enterococcus Urine culture positive for staph aureus Currently on ampicillin and Rocephin  PAF Status post maze and left AA clipping Was on Coumadin before Now on Eliquis and Tikosyn Cardiology on board  History of stroke, SDH and seizure Stroke in 08/2013 07/27/2020 admitted for traumatic right large SDH status post bur hole  evacuation.  Coumadin was on hold due to SDH 08/07/2020 admitted for altered mental status with left-sided weakness, CT showed right MCA large infarct.  CTA head and neck right ICA and right M2 occlusion status post IR with TICI2c revascularization.  EF  60 to 65%, LDL 56, A1c 6.2, patient discharged to [facility].  Started Eliquis on 08/25/2020 in CIR. 08/28/2020 had a seizure activity, MRI no significant change, put on Keppra. Current admission, EEG and long-term EEG no seizure.  Diabetes HgbA1c 7.3 goal < 7.0 Uncontrolled CBG monitoring SSI DM education and close PCP follow up  Hypertension stable BP goal < 160 Long term BP goal normotensive  Hyperlipidemia Home meds: Lipitor 80 LDL 56 in 08/2020, goal < 23 Hold off now due to AMS Consider to resume statin at discharge  Other Stroke Risk Factors Advanced age Rheumatic heart disease status post AV and MV replacement  Other Active Problems anemia of chronic disease, hemoglobin 8.8  Hospital day # 4  I discussed with Dr. Sloan Leiter. I spent  35 minutes in total face-to-face time with the patient, more than 50% of which was spent in counseling and coordination of care, reviewing test results, images and medication, and discussing the diagnosis, treatment plan and potential prognosis. This patient's care requiresreview of multiple databases, neurological assessment, discussion with family, other specialists and medical decision making of high complexity. I had long discussion with wife and son at bedside as well as another son over the phone, updated pt current condition, treatment plan and potential prognosis, and answered all the questions. They expressed understanding and appreciation.    Rosalin Hawking, MD PhD Stroke Neurology 12/19/2020 5:35 PM    To contact Stroke Continuity provider, please refer to [email or web address] After hours, contact General Neurology

## 2020-12-19 NOTE — Progress Notes (Signed)
Patient ID: [name] [name], male   DOB: Dec 03, 1940, 80 y.o.   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 Cultures with Enterococcus faecalis.  The valve does not show significant degeneration/regurgitation.  He would not be a candidate for redo cardiac surgery.  Will need to manage medically (antibiotics).   I will see again Monday unless called.   Loralie Champagne 12/19/2020

## 2020-12-19 NOTE — Evaluation (Signed)
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   External hemorrhoids without mention of complication    H/O aortic valve replacement    H/O mitral valve replacement    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   Lesion of ulnar nerve    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    Other and unspecified hyperlipidemia    Pre-diabetes    Previous back surgery 1978, jan 2007   Psychosexual dysfunction with inhibited sexual excitement    SOB (shortness of breath)    "with heavy exercise"   Stroke ([place]) 08/2013   "I WAS IN AFIB AND THREW A CLOT, THE EFFECTS WERE TRANSITORY AND DIDNT LAST BUT FOR 30 MINUTES"    Thoracic aortic aneurysm    Past Surgical History:  Past Surgical History:  Procedure Laterality Date   AORTIC AND MITRAL VALVE REPLACEMENT     09/2004   CARDIOVERSION     3 times from 2004-2006   CARDIOVERSION N/A 09/26/2013   Procedure: CARDIOVERSION;  Surgeon: Larey Dresser, MD;  Location: [place];  Service: Cardiovascular;  Laterality: N/A;   CARDIOVERSION N/A 06/19/2014   Procedure: CARDIOVERSION;  Surgeon: Jerline Pain, MD;  Location: [place];  Service: Cardiovascular;  Laterality: N/A;   CARDIOVERSION N/A 04/24/2017   Procedure: CARDIOVERSION;  Surgeon: Larey Dresser, MD;  Location: [place];  Service: Cardiovascular;  Laterality: N/A;   CARDIOVERSION N/A 06/08/2017   Procedure: CARDIOVERSION;  Surgeon: Pixie Casino, MD;   Location: [facility] ENDOSCOPY;  Service: Cardiovascular;  Laterality: N/A;   CARDIOVERSION N/A 08/24/2017   Procedure: CARDIOVERSION;  Surgeon: Skeet Latch, MD;  Location: [facility] ENDOSCOPY;  Service: Cardiovascular;  Laterality: N/A;   CARDIOVERSION N/A 02/14/2018   Procedure: CARDIOVERSION;  Surgeon: Larey Dresser, MD;  Location: [facility] ENDOSCOPY;  Service: Cardiovascular;  Laterality: N/A;   COLONOSCOPY     CRANIOTOMY N/A 07/28/2020   Procedure: CRANIOTOMY FOR EVACUATION OF SUBDURAL HEMATOMA;  Surgeon: Eustace Rambert, MD;  Location: [place];  Service: Neurosurgery;  Laterality: N/A;   IR ANGIO VERTEBRAL SEL SUBCLAVIAN INNOMINATE UNI R MOD SED  08/07/2020   IR CT HEAD LTD  08/07/2020   IR PERCUTANEOUS ART THROMBECTOMY/INFUSION INTRACRANIAL INC DIAG ANGIO  08/07/2020   laminectomies     10/1975 and in 03/2005   RADIOLOGY WITH ANESTHESIA N/A 08/07/2020   Procedure: IR WITH ANESTHESIA;  Surgeon: Luanne Bras, MD;  Location: [place];  Service: Radiology;  Laterality: N/A;   TONSILLECTOMY     1950   TOTAL HIP ARTHROPLASTY Right 04/03/2018   Procedure: TOTAL HIP ARTHROPLASTY ANTERIOR APPROACH;  Surgeon: Paralee Cancel, MD;  Location: [facility];  Service: Orthopedics;  Laterality: Right;  70 mins   HPI:  80 y.o. 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  TEE completed on 12/18/20. Pt has a hx of cognitive deficits and dysphagia, with most recent MBS on 09/02/20 and most recent diet recommendations for Dysphagia 1 solids and honey-thick liquids.  Cortrak placed 12/18/20.    Assessment / Plan / Recommendation  Clinical Impression  Pt was seen for a bedside swallow evaluation.  He was encountered asleep in  bed and he required max verbal and tactile cues from SLP and MD to rouse.  Pt was not observed to vocalize once awakened despite prompting questions.  Pt was unable to follow commands to complete an oral mechanism examination, but adequate dentition was noted.  SLP completed oral care with suction swab.  Pt was then seen with trials of ice chips x2.  Pt passively accepted ice chips into his oral cavity, but he was unable to follow commands for oral closure.  No lingual response to ice chips and no attempts at lingual manipulation or swallow initiation with either trial.  Boluses were eventually suctioned from the pt's oral cavity secondary to aspiration concerns.  Recommend continuation of NPO and alternative means of nutrition via cortrak.  Additionally recommend frequent oral care.  SLP will f/u per POC.  SLP Visit Diagnosis: Dysphagia, unspecified (R13.10)    Aspiration Risk  Severe aspiration risk    Diet Recommendation NPO;Alternative means - temporary   Medication Administration: Via alternative means    Other  Recommendations Oral Care Recommendations: Oral care QID;Staff/trained caregiver to provide oral care Other Recommendations: Have oral suction available    Recommendations for follow up therapy are one component of a multi-disciplinary discharge planning process, led by the attending physician.  Recommendations may be updated based on patient status, additional functional criteria and insurance authorization.  Follow up Recommendations Skilled Nursing facility      Frequency and Duration min 2x/week  2 weeks       Prognosis Prognosis for Safe Diet Advancement: Guarded Barriers to Reach Goals: Cognitive deficits;Severity of deficits      Swallow Study   General HPI: 80 y.o. 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  TEE completed on 12/18/20. Pt has a hx of cognitive deficits and dysphagia, with most recent MBS on 09/02/20 and most recent diet recommendations for Dysphagia 1 solids and honey-thick liquids.  Cortrak placed 12/18/20. Type of Study: Bedside Swallow Evaluation Previous Swallow Assessment: See HPI Diet Prior to this Study: NPO Temperature Spikes Noted: No Respiratory Status: Room air History of Recent Intubation: No Behavior/Cognition: Lethargic/Drowsy;Doesn't follow directions Oral Cavity Assessment: Within Functional Limits Oral Care Completed by SLP: Yes Oral Cavity - Dentition: Adequate natural dentition Self-Feeding Abilities: Total assist Patient Positioning: Upright in bed Baseline Vocal Quality: Not observed Volitional Cough: Cognitively unable to elicit Volitional Swallow: Unable to elicit    Oral/Motor/Sensory Function Overall Oral Motor/Sensory Function: Other (comment) (Unable to evaluate)   Ice Chips Ice chips: Impaired Presentation: Spoon Oral Phase Impairments: Poor awareness of bolus Oral Phase Functional Implications: Oral holding Pharyngeal Phase Impairments: Unable to trigger swallow   Thin Liquid Thin Liquid: Not tested    Nectar Thick Nectar Thick Liquid: Not tested  Honey Thick Honey Thick Liquid: Not tested   Puree Puree: Not tested   Solid     Solid: Not tested     Colin Mulders M.S., CCC-SLP Acute Rehabilitation Services Office: [phone]  Elvia Collum [place] 12/19/2020,8:53 AM

## 2020-12-19 NOTE — Progress Notes (Signed)
PT Cancellation Note  Patient Details Name: [name] [name] MRN: [id number] DOB: 23-Nov-1940   Cancelled Treatment:    Reason Eval/Treat Not Completed: Other (comment) Per RN, pt unresponsive, may be considering palliative consult  Shray Hunley A. Gilford Rile PT, DPT Acute Rehabilitation Services Pager [phone] Office [phone]    Linna Hoff 12/19/2020, 3:42 PM

## 2020-12-20 ENCOUNTER — Encounter (HOSPITAL_COMMUNITY): Payer: Self-pay | Admitting: Cardiology

## 2020-12-20 DIAGNOSIS — G9341 Metabolic encephalopathy: Secondary | ICD-10-CM | POA: Diagnosis not present

## 2020-12-20 DIAGNOSIS — R7881 Bacteremia: Secondary | ICD-10-CM | POA: Diagnosis not present

## 2020-12-20 DIAGNOSIS — Z952 Presence of prosthetic heart valve: Secondary | ICD-10-CM | POA: Diagnosis not present

## 2020-12-20 DIAGNOSIS — Z8673 Personal history of transient ischemic attack (TIA), and cerebral infarction without residual deficits: Secondary | ICD-10-CM | POA: Diagnosis not present

## 2020-12-20 DIAGNOSIS — E870 Hyperosmolality and hypernatremia: Secondary | ICD-10-CM | POA: Diagnosis not present

## 2020-12-20 DIAGNOSIS — I480 Paroxysmal atrial fibrillation: Secondary | ICD-10-CM | POA: Diagnosis not present

## 2020-12-20 DIAGNOSIS — Z9889 Other specified postprocedural states: Secondary | ICD-10-CM | POA: Diagnosis not present

## 2020-12-20 DIAGNOSIS — E43 Unspecified severe protein-calorie malnutrition: Secondary | ICD-10-CM | POA: Diagnosis not present

## 2020-12-20 LAB — COMPREHENSIVE METABOLIC PANEL
ALT: 72 U/L — ABNORMAL HIGH (ref 0–44)
AST: 60 U/L — ABNORMAL HIGH (ref 15–41)
Albumin: 1.6 g/dL — ABNORMAL LOW (ref 3.5–5.0)
Alkaline Phosphatase: 61 U/L (ref 38–126)
Anion gap: 5 (ref 5–15)
BUN: 15 mg/dL (ref 8–23)
CO2: 23 mmol/L (ref 22–32)
Calcium: 7.5 mg/dL — ABNORMAL LOW (ref 8.9–10.3)
Chloride: 109 mmol/L (ref 98–111)
Creatinine, Ser: 0.69 mg/dL (ref 0.61–1.24)
GFR, Estimated: >60 mL/min (ref >60)
Glucose, Bld: 190 mg/dL — ABNORMAL HIGH (ref 70–99)
Potassium: 4.2 mmol/L (ref 3.5–5.1)
Sodium: 137 mmol/L (ref 135–145)
Total Bilirubin: 0.5 mg/dL (ref 0.3–1.2)
Total Protein: 5.3 g/dL — ABNORMAL LOW (ref 6.5–8.1)

## 2020-12-20 LAB — GLUCOSE, CAPILLARY
Glucose-Capillary: 156 mg/dL — ABNORMAL HIGH (ref 70–99)
Glucose-Capillary: 158 mg/dL — ABNORMAL HIGH (ref 70–99)
Glucose-Capillary: 171 mg/dL — ABNORMAL HIGH (ref 70–99)
Glucose-Capillary: 175 mg/dL — ABNORMAL HIGH (ref 70–99)
Glucose-Capillary: 197 mg/dL — ABNORMAL HIGH (ref 70–99)
Glucose-Capillary: 208 mg/dL — ABNORMAL HIGH (ref 70–99)

## 2020-12-20 LAB — CBC
HCT: 26.8 % — ABNORMAL LOW (ref 39.0–52.0)
Hemoglobin: 8.7 g/dL — ABNORMAL LOW (ref 13.0–17.0)
MCH: 28.5 pg (ref 26.0–34.0)
MCHC: 32.5 g/dL (ref 30.0–36.0)
MCV: 87.9 fL (ref 80.0–100.0)
Platelets: 191 10*3/uL (ref 150–400)
RBC: 3.05 MIL/uL — ABNORMAL LOW (ref 4.22–5.81)
RDW: 16.2 % — ABNORMAL HIGH (ref 11.5–15.5)
WBC: 9.1 10*3/uL (ref 4.0–10.5)
nRBC: 0 % (ref 0.0–0.2)

## 2020-12-20 LAB — CULTURE, BLOOD (ROUTINE X 2)
Culture: NO GROWTH
Special Requests: ADEQUATE

## 2020-12-20 LAB — MAGNESIUM: Magnesium: 1.9 mg/dL (ref 1.7–2.4)

## 2020-12-20 MED ORDER — LEVETIRACETAM 100 MG/ML PO SOLN: 500.0000 mg | Freq: Two times a day (BID) | ORAL | Status: DC

## 2020-12-20 MED ORDER — AMANTADINE HCL 50 MG/5ML PO SOLN
200.0000 mg | Freq: Two times a day (BID) | ORAL | Status: DC
  Administered 2020-12-21 – 2020-12-30 (×19): 200 mg
  Filled 2020-12-20 (×21): qty 20

## 2020-12-20 MED ORDER — APIXABAN 5 MG PO TABS
5.0000 mg | ORAL_TABLET | Freq: Two times a day (BID) | ORAL | Status: DC
  Administered 2020-12-20 – 2020-12-25 (×10): 5 mg
  Filled 2020-12-20 (×10): qty 1

## 2020-12-20 MED ORDER — LACOSAMIDE 50 MG PO TABS
50.0000 mg | ORAL_TABLET | Freq: Two times a day (BID) | ORAL | Status: DC
  Administered 2020-12-20: 50 mg
  Filled 2020-12-20 (×2): qty 1

## 2020-12-20 NOTE — Progress Notes (Addendum)
PROGRESS NOTE        PATIENT DETAILS Name: [name] [name] Age: 80 y.o. Sex: male Date of Birth: October 19, 1940 Admit Date: 12/14/2020 Admitting Physician Georgette Shell, MD [id number], Arvid Right, MD  Brief Narrative: Patient is a 80 y.o. 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  See below for further details.    Subjective: Remains essentially unchanged-mostly lethargic/sleepy-and briefly opens his eyes to painful/verbal stimuli.  Not really interactive today.   Objective: Vitals: Blood pressure 101/64, pulse 71, temperature 97.9 F (36.6 C), temperature source Oral, resp. rate 18, height 5\' 7"  (1.702 m), weight 82.6 kg, SpO2 96 %.   Exam: Gen Exam: Lethargic/sleepy-not interacting when he opens his eyes. HEENT:atraumatic, normocephalic Chest: B/L clear to auscultation anteriorly CVS:S1S2 regular Abdomen:soft non tender, non distended Extremities:no edema Neurology: Left-sided hemiplegia appears unchanged.   Skin: no rash   Pertinent Labs/Radiology: Na:137 K: 4.2 creatinine: 0.69  WBC: 9.1 Hb: 8.7  platelet: 190  10/10>>Blood culture: Enterococcus Enterococcus faecalis 10/10>> urine culture: Staff aureus 10/11>> blood culture: 1/2-Enterococcus faecalis 10/13>> blood culture: 1/2-Enterococcus faecalis 10/15>> blood culture: No growth  10/10>> CT head: Small right subdural hematoma 10/11>> TTE: EF-65-70%, no obvious vegetation. 10/14>> TEE: Prosthetic mitral valve vegetation 10/14>> MRI brain: Acute infarct left  cerebellar vermis, small subacute hematoma in the anterior right frontal lobe.  10/13-2010/14>> LTM EEG: No seizures.  Assessment/Plan: Acute metabolic encephalopathy: Due to hypernatremia/enterococcal bacteremia-continues to be encephalopathic/lethargic-not very interactive in spite of treatment of underlying hypernatremia and enterococcal bacteremia.  Neuroimaging/LTM EEG as above.  Plans are to continue supportive care-continue to treat enterococcal bacteremia and observe over the next few days.  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  On Rocephin/ampicillin.  ID following.      Hypernatremia: Resolved-continue to encourage oral intake-on gentle hydration with IVF.  Watch closely.  PAF: Continue Tikosyn-remains on Eliquis.  Acute left cerebellar vermis CVA: Felt to be a septic emboli in the setting of mitral valve endocarditis.  Significant encephalopathy persists-see above.  History of seizure disorder: On Keppra-LTM EEG negative for seizures.  Discussed with Dr. Erlinda Hong on 10/15-since patient has been on [place] for a while-unlikely that this is contributing to somnolence.   Recent SDH May 2022: From signout from prior MD-Dr. 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  Continues to have significant left-sided hemiparesis.  On Eliquis.  History of rheumatic heart disease-s/p remote AVR/MVR (bioprosthetic valve)  Severe failure to thrive syndrome/frailty/poor oral intake/goals of care: Continues to be lethargic-no significant oral intake over the past few days-cortak inserted on 10/14-on feeds.  DNR in  place-family does not desire aggressive care-but if patient encephalopathy improves-starts interacting with family (even if bedbound )-patient's family not adverse to placing PEG tube (if oral intake continues to be poor) .  If patient does  not improve-if family willing consider hospice measures at some point.  And is to watch/observe over the next few days-continue IV antibiotics/NG tube feedings-and see if patient improves-family wishes to hold off on palliative care consultation as goals of care are very clear and defined as above.  Nutrition Status: Nutrition Problem: Severe Malnutrition Etiology: chronic illness (CHF, Stroke) Signs/Symptoms: percent weight loss, moderate fat depletion, severe muscle depletion Percent weight loss: 12 % Interventions: Tube feeding, Prostat, MVI    Procedures: None Consults: None DVT Prophylaxis: Eliquis Code Status:DNR Family Communication: [name] at bedside-10/15-son as usual was on the phone.  Time spent: 35 minutes-Greater than 50% of this time was spent in counseling, explanation of diagnosis, planning of further management, and coordination of care.  Diet: Diet Order             Diet NPO time specified Except for: Sips with Meds  Diet effective now                      Disposition Plan: Status is: Inpatient  Remains inpatient appropriate because:Inpatient level of care appropriate due to severity of illness  Dispo: The patient is from: SNF              Anticipated d/c is to: SNF              Patient currently is not medically stable to d/c.   Difficult to place patient No    Barriers to Discharge: Persistent encephalopathy-persistent bacteremia with Enterococcus-on IV antibiotics-NG tube feedings ongoing due to poor oral intake.  Antimicrobial agents: Anti-infectives (From admission, onward)    Start     Dose/Rate Route Frequency Ordered Stop   12/18/20 1500  cefTRIAXone (ROCEPHIN) 2 g in sodium chloride 0.9 % 100 mL  IVPB        2 g 200 mL/hr over 30 Minutes Intravenous Every 12 hours 12/18/20 1344     12/15/20 1700  cefTRIAXone (ROCEPHIN) 1 g in sodium chloride 0.9 % 100 mL IVPB  Status:  Discontinued        1 g 200 mL/hr over 30 Minutes Intravenous Every 24 hours 12/14/20 1648 12/15/20 0624   12/15/20 0630  ampicillin (OMNIPEN) 2 g in sodium chloride 0.9 % 100 mL IVPB        2 g 300 mL/hr over 20 Minutes Intravenous Every 6 hours 12/15/20 0624     12/15/20 0630  gentamicin (GARAMYCIN) IVPB 80 mg        80 mg 100 mL/hr over 30 Minutes Intravenous  Once 12/15/20 0628 12/15/20 0732   12/14/20 1645  cefTRIAXone (ROCEPHIN) 1 g in sodium chloride 0.9 % 100 mL IVPB        1 g 200 mL/hr over 30 Minutes Intravenous  Once 12/14/20 1642 12/14/20 1830        MEDICATIONS: Scheduled Meds:  apixaban  5 mg Oral BID   dofetilide  500 mcg Oral BID   feeding supplement (PROSource TF)  45 mL Per Tube Daily   finasteride  5 mg Oral Daily   free water  125 mL Per Tube Q4H   insulin aspart  0-9 Units Subcutaneous TID WC   levETIRAcetam  500 mg Per Tube BID   mouth rinse  15 mL Mouth Rinse BID   multivitamin with minerals  1 tablet Per Tube Daily   sodium chloride flush  3 mL Intravenous Q12H   tamsulosin  0.4 mg Oral Daily   Continuous Infusions:  sodium  chloride 30 mL/hr at 12/20/20 1141   sodium chloride     ampicillin (OMNIPEN) IV Stopped (12/20/20 0848)   cefTRIAXone (ROCEPHIN)  IV Stopped (12/20/20 1129)   feeding supplement (OSMOLITE 1.2 CAL) 1,000 mL (12/20/20 1111)   PRN Meds:.sodium chloride, sodium chloride flush   I have personally reviewed following labs and imaging studies  LABORATORY DATA: CBC: Recent Labs  Lab 12/14/20 1245 12/14/20 1504 12/15/20 0508 12/16/20 0130 12/18/20 0221 12/19/20 0610 12/20/20 0153  WBC 12.0*  --  11.1* 10.7* 9.1 10.1 9.1  NEUTROABS 9.3*  --   --  7.7  --   --   --   HGB 11.7*   < > 9.8* 9.9* 8.8* 8.8* 8.7*  HCT 36.8*   < > 31.6* 30.7* 27.3* 27.6*  26.8*  MCV 90.0  --  92.4 88.0 88.1 87.9 87.9  PLT 119*  --  108* 106* 132* 176 191   < > = values in this interval not displayed.     Basic Metabolic Panel: Recent Labs  Lab 12/16/20 0130 12/17/20 0210 12/18/20 0221 12/18/20 1210 12/18/20 1630 12/19/20 0610 12/19/20 1632 12/20/20 0153  NA 146* 144 143  --   --  139  --  137  K 3.6 3.9 3.7  --   --  3.5  --  4.2  CL 117* 117* 114*  --   --  108  --  109  CO2 22 22 24   --   --  23  --  23  GLUCOSE 145* 208* 152*  --   --  186*  --  190*  BUN 26* 23 18  --   --  16  --  15  CREATININE 0.84 0.79 0.78  --   --  0.76  --  0.69  CALCIUM 8.1* 7.7* 7.6*  --   --  7.7*  --  7.5*  MG 2.1 2.1 2.0 1.9 1.9 2.0 1.9 1.9  PHOS  --   --   --  2.8 3.1 2.7 2.4*  --      GFR: Estimated Creatinine Clearance: 78.3 mL/min (by C-G formula based on SCr of 0.69 mg/dL).  Liver Function Tests: Recent Labs  Lab 12/14/20 1245 12/15/20 0508 12/16/20 0130 12/19/20 0610 12/20/20 0153  AST 63* 48* 56* 64* 60*  ALT 92* 75* 76* 77* 72*  ALKPHOS 68 65 66 68 61  BILITOT 0.5 0.7 0.5 0.4 0.5  PROT 6.8 5.8* 5.8* 5.7* 5.3*  ALBUMIN 2.3* 2.0* 1.9* 1.7* 1.6*    No results for input(s): LIPASE, AMYLASE in the last 168 hours. Recent Labs  Lab 12/14/20 1639  AMMONIA 25     Coagulation Profile: Recent Labs  Lab 12/14/20 1245 12/15/20 0508  INR 1.5* 1.5*     Cardiac Enzymes: No results for input(s): CKTOTAL, CKMB, CKMBINDEX, TROPONINI in the last 168 hours.  BNP (last 3 results) No results for input(s): PROBNP in the last 8760 hours.  Lipid Profile: No results for input(s): CHOL, HDL, LDLCALC, TRIG, CHOLHDL, LDLDIRECT in the last 72 hours.  Thyroid Function Tests: No results for input(s): TSH, T4TOTAL, FREET4, T3FREE, THYROIDAB in the last 72 hours.   Anemia Panel: No results for input(s): VITAMINB12, FOLATE, FERRITIN, TIBC, IRON, RETICCTPCT in the last 72 hours.  Urine analysis:    Component Value Date/Time   COLORURINE YELLOW  12/14/2020 1600   APPEARANCEUR CLOUDY (A) 12/14/2020 1600   LABSPEC 1.020 12/14/2020 1600   PHURINE 6.0 12/14/2020 1600   GLUCOSEU NEGATIVE  12/14/2020 1600   HGBUR LARGE (A) 12/14/2020 1600   BILIRUBINUR NEGATIVE 12/14/2020 1600   BILIRUBINUR negative 11/22/2013 0848   KETONESUR NEGATIVE 12/14/2020 1600   PROTEINUR NEGATIVE 12/14/2020 1600   UROBILINOGEN 0.2 11/22/2013 0848   UROBILINOGEN 1.0 08/05/2013 2207   NITRITE POSITIVE (A) 12/14/2020 1600   LEUKOCYTESUR LARGE (A) 12/14/2020 1600    Sepsis Labs: Lactic Acid, Venous    Component Value Date/Time   LATICACIDVEN 1.8 12/14/2020 2056    MICROBIOLOGY: Recent Results (from the past 240 hour(s))  Culture, blood (Routine x 2)     Status: Abnormal   Collection Time: 12/14/20 12:45 PM   Specimen: BLOOD RIGHT FOREARM  Result Value Ref Range Status   Specimen Description BLOOD RIGHT FOREARM  Final   Special Requests   Final    BOTTLES DRAWN AEROBIC AND ANAEROBIC Blood Culture adequate volume   Culture  Setup Time   Final    GRAM POSITIVE COCCI IN BOTH AEROBIC AND ANAEROBIC BOTTLES CRITICAL RESULT CALLED TO, READ BACK BY AND VERIFIED WITH: PHARMD JAMES LEDFORD 12/15/2020@6 :07 BY TW Performed at [facility], [place] [address], [place], [place] [address]    Culture ENTEROCOCCUS FAECALIS (A)  Final   Report Status 12/17/2020 FINAL  Final   Organism ID, Bacteria ENTEROCOCCUS FAECALIS  Final      Susceptibility   Enterococcus faecalis - MIC*    AMPICILLIN <=2 SENSITIVE Sensitive     VANCOMYCIN 2 SENSITIVE Sensitive     GENTAMICIN SYNERGY SENSITIVE Sensitive     * ENTEROCOCCUS FAECALIS  Blood Culture ID Panel (Reflexed)     Status: Abnormal   Collection Time: 12/14/20 12:45 PM  Result Value Ref Range Status   Enterococcus faecalis DETECTED (A) NOT DETECTED Final    Comment: CRITICAL RESULT CALLED TO, READ BACK BY AND VERIFIED WITH: PHARMD JAMES LEDFORD 12/15/2020@6 :07 BY TW    Enterococcus Faecium NOT DETECTED NOT  DETECTED Final   Listeria monocytogenes NOT DETECTED NOT DETECTED Final   Staphylococcus species NOT DETECTED NOT DETECTED Final   Staphylococcus aureus (BCID) NOT DETECTED NOT DETECTED Final   Staphylococcus epidermidis NOT DETECTED NOT DETECTED Final   Staphylococcus lugdunensis NOT DETECTED NOT DETECTED Final   Streptococcus species NOT DETECTED NOT DETECTED Final   Streptococcus agalactiae NOT DETECTED NOT DETECTED Final   Streptococcus pneumoniae NOT DETECTED NOT DETECTED Final   Streptococcus pyogenes NOT DETECTED NOT DETECTED Final   A.calcoaceticus-baumannii NOT DETECTED NOT DETECTED Final   Bacteroides fragilis NOT DETECTED NOT DETECTED Final   Enterobacterales NOT DETECTED NOT DETECTED Final   Enterobacter cloacae complex NOT DETECTED NOT DETECTED Final   Escherichia coli NOT DETECTED NOT DETECTED Final   Klebsiella aerogenes NOT DETECTED NOT DETECTED Final   Klebsiella oxytoca NOT DETECTED NOT DETECTED Final   Klebsiella pneumoniae NOT DETECTED NOT DETECTED Final   Proteus species NOT DETECTED NOT DETECTED Final   Salmonella species NOT DETECTED NOT DETECTED Final   Serratia marcescens NOT DETECTED NOT DETECTED Final   Haemophilus influenzae NOT DETECTED NOT DETECTED Final   Neisseria meningitidis NOT DETECTED NOT DETECTED Final   Pseudomonas aeruginosa NOT DETECTED NOT DETECTED Final   Stenotrophomonas maltophilia NOT DETECTED NOT DETECTED Final   Candida albicans NOT DETECTED NOT DETECTED Final   Candida auris NOT DETECTED NOT DETECTED Final   Candida glabrata NOT DETECTED NOT DETECTED Final   Candida krusei NOT DETECTED NOT DETECTED Final   Candida parapsilosis NOT DETECTED NOT DETECTED Final   Candida tropicalis NOT DETECTED NOT  DETECTED Final   Cryptococcus neoformans/gattii NOT DETECTED NOT DETECTED Final   Vancomycin resistance NOT DETECTED NOT DETECTED Final    Comment: Performed at [facility], [place] [address], [place], [place] [address]  Resp Panel by  RT-PCR (Flu A&B, Covid) Nasopharyngeal Swab     Status: None   Collection Time: 12/14/20  3:41 PM   Specimen: Nasopharyngeal Swab; Nasopharyngeal(NP) swabs in vial transport medium  Result Value Ref Range Status   SARS Coronavirus 2 by RT PCR NEGATIVE NEGATIVE Final    Comment: (NOTE) SARS-CoV-2 target nucleic acids are NOT DETECTED.  The SARS-CoV-2 RNA is generally detectable in upper respiratory specimens during the acute phase of infection. The lowest concentration of SARS-CoV-2 viral copies this assay can detect is 138 copies/mL. A negative result does not preclude SARS-Cov-2 infection and should not be used as the sole basis for treatment or other patient management decisions. A negative result may occur with  improper specimen collection/handling, submission of specimen other than nasopharyngeal swab, presence of viral mutation(s) within the areas targeted by this assay, and inadequate number of viral copies(<138 copies/mL). A negative result must be combined with clinical observations, patient history, and epidemiological information. The expected result is Negative.  Fact Sheet for Patients:  EntrepreneurPulse.com.au  Fact Sheet for Healthcare Providers:  IncredibleEmployment.be  This test is no t yet approved or cleared by the [place] FDA and  has been authorized for detection and/or diagnosis of SARS-CoV-2 by FDA under an Emergency Use Authorization (EUA). This EUA will remain  in effect (meaning this test can be used) for the duration of the COVID-19 declaration under Section 564(b)(1) of the Act, 21 U.S.C.section 360bbb-3(b)(1), unless the authorization is terminated  or revoked sooner.       Influenza A by PCR NEGATIVE NEGATIVE Final   Influenza B by PCR NEGATIVE NEGATIVE Final    Comment: (NOTE) The Xpert Xpress SARS-CoV-2/FLU/RSV plus assay is intended as an aid in the diagnosis of influenza from Nasopharyngeal swab  specimens and should not be used as a sole basis for treatment. Nasal washings and aspirates are unacceptable for Xpert Xpress SARS-CoV-2/FLU/RSV testing.  Fact Sheet for Patients: EntrepreneurPulse.com.au  Fact Sheet for Healthcare Providers: IncredibleEmployment.be  This test is not yet approved or cleared by the [place] FDA and has been authorized for detection and/or diagnosis of SARS-CoV-2 by FDA under an Emergency Use Authorization (EUA). This EUA will remain in effect (meaning this test can be used) for the duration of the COVID-19 declaration under Section 564(b)(1) of the Act, 21 U.S.C. section 360bbb-3(b)(1), unless the authorization is terminated or revoked.  Performed at [facility], [place] [address], [place],  [address]   Culture, blood (Routine x 2)     Status: Abnormal   Collection Time: 12/14/20  4:39 PM   Specimen: BLOOD  Result Value Ref Range Status   Specimen Description BLOOD RIGHT HAND  Final   Special Requests   Final    BOTTLES DRAWN AEROBIC AND ANAEROBIC Blood Culture results may not be optimal due to an inadequate volume of blood received in culture bottles   Culture  Setup Time   Final    GRAM POSITIVE COCCI IN CHAINS IN BOTH AEROBIC AND ANAEROBIC BOTTLES CRITICAL VALUE NOTED.  VALUE IS CONSISTENT WITH PREVIOUSLY REPORTED AND CALLED VALUE.    Culture (A)  Final    ENTEROCOCCUS FAECALIS SUSCEPTIBILITIES PERFORMED ON PREVIOUS CULTURE WITHIN THE LAST 5 DAYS. Performed at [facility],  [address] [place], [place] [address]    Report Status 12/17/2020 FINAL  Final  Urine Culture     Status: Abnormal   Collection Time: 12/14/20  4:43 PM   Specimen: Urine, Clean Catch  Result Value Ref Range Status   Specimen Description URINE, CLEAN CATCH  Final   Special Requests   Final    NONE Performed at [facility], [place] [address], [place], [place] [address]    Culture >=100,000  COLONIES/mL STAPHYLOCOCCUS AUREUS (A)  Final   Report Status 12/17/2020 FINAL  Final   Organism ID, Bacteria STAPHYLOCOCCUS AUREUS (A)  Final      Susceptibility   Staphylococcus aureus - MIC*    CIPROFLOXACIN <=0.5 SENSITIVE Sensitive     GENTAMICIN <=0.5 SENSITIVE Sensitive     NITROFURANTOIN 32 SENSITIVE Sensitive     OXACILLIN <=0.25 SENSITIVE Sensitive     TETRACYCLINE <=1 SENSITIVE Sensitive     VANCOMYCIN <=0.5 SENSITIVE Sensitive     TRIMETH/SULFA <=10 SENSITIVE Sensitive     CLINDAMYCIN <=0.25 SENSITIVE Sensitive     RIFAMPIN <=0.5 SENSITIVE Sensitive     Inducible Clindamycin NEGATIVE Sensitive     * >=100,000 COLONIES/mL STAPHYLOCOCCUS AUREUS  Culture, blood (routine x 2)     Status: Abnormal   Collection Time: 12/15/20 11:20 AM   Specimen: BLOOD RIGHT ARM  Result Value Ref Range Status   Specimen Description BLOOD RIGHT ARM  Final   Special Requests   Final    BOTTLES DRAWN AEROBIC AND ANAEROBIC Blood Culture adequate volume   Culture  Setup Time   Final    GRAM POSITIVE COCCI ANAEROBIC BOTTLE ONLY CRITICAL VALUE NOTED.  VALUE IS CONSISTENT WITH PREVIOUSLY REPORTED AND CALLED VALUE.    Culture (A)  Final    ENTEROCOCCUS FAECALIS SUSCEPTIBILITIES PERFORMED ON PREVIOUS CULTURE WITHIN THE LAST 5 DAYS. Performed at [facility], [place] [address] [place], [place] [address]    Report Status 12/19/2020 FINAL  Final  Culture, blood (routine x 2)     Status: None   Collection Time: 12/15/20 11:25 AM   Specimen: BLOOD LEFT ARM  Result Value Ref Range Status   Specimen Description BLOOD LEFT ARM  Final   Special Requests   Final    BOTTLES DRAWN AEROBIC ONLY Blood Culture results may not be optimal due to an inadequate volume of blood received in culture bottles   Culture   Final    NO GROWTH 5 DAYS Performed at [facility], [place] [address] [place], [place] [address]    Report Status 12/20/2020 FINAL  Final  MRSA Next Gen by PCR, Nasal     Status: None    Collection Time: 12/16/20  4:34 AM   Specimen: Nasal Mucosa; Nasal Swab  Result Value Ref Range Status   MRSA by PCR Next Gen NOT DETECTED NOT DETECTED Final    Comment: (NOTE) The GeneXpert MRSA Assay (FDA approved for NASAL specimens only), is one component of a comprehensive MRSA colonization surveillance program. It is not intended to diagnose MRSA infection nor to guide or monitor treatment for MRSA infections. Test performance is not FDA approved in patients less than 44 years old. Performed at [facility], [place] [address] [place], [place] [address]   Culture, blood (routine x 2)     Status: Abnormal   Collection Time: 12/17/20  8:01 AM   Specimen: BLOOD RIGHT HAND  Result Value Ref Range Status   Specimen  Description BLOOD RIGHT HAND  Final   Special Requests   Final    BOTTLES DRAWN AEROBIC AND ANAEROBIC Blood Culture adequate volume   Culture  Setup Time   Final    GRAM POSITIVE COCCI IN CHAINS ANAEROBIC BOTTLE ONLY CRITICAL VALUE NOTED.  VALUE IS CONSISTENT WITH PREVIOUSLY REPORTED AND CALLED VALUE.    Culture (A)  Final    ENTEROCOCCUS FAECALIS SUSCEPTIBILITIES PERFORMED ON PREVIOUS CULTURE WITHIN THE LAST 5 DAYS. Performed at [facility], [place] [address], [place], [place] [address]    Report Status 12/20/2020 FINAL  Final  Culture, blood (routine x 2)     Status: None (Preliminary result)   Collection Time: 12/17/20  8:03 AM   Specimen: BLOOD RIGHT WRIST  Result Value Ref Range Status   Specimen Description BLOOD RIGHT WRIST  Final   Special Requests   Final    BOTTLES DRAWN AEROBIC AND ANAEROBIC Blood Culture adequate volume   Culture   Final    NO GROWTH 3 DAYS Performed at [facility], [place] [address] [place], [place] [address]    Report Status PENDING  Incomplete  Culture, blood (routine x 2)     Status: None (Preliminary result)   Collection Time: 12/19/20  7:46 AM   Specimen: BLOOD  Result Value Ref Range Status   Specimen  Description BLOOD RIGHT ANTECUBITAL  Final   Special Requests   Final    BOTTLES DRAWN AEROBIC AND ANAEROBIC Blood Culture adequate volume   Culture   Final    NO GROWTH 1 DAY Performed at [facility], [place] [address], [place], [place] [address]    Report Status PENDING  Incomplete  Culture, blood (routine x 2)     Status: None (Preliminary result)   Collection Time: 12/19/20  7:47 AM   Specimen: BLOOD  Result Value Ref Range Status   Specimen Description BLOOD LEFT ANTECUBITAL  Final   Special Requests   Final    BOTTLES DRAWN AEROBIC AND ANAEROBIC Blood Culture adequate volume   Culture   Final    NO GROWTH 1 DAY Performed at [facility], [place] [address] [place], [place] [address]    Report Status PENDING  Incomplete    RADIOLOGY STUDIES/RESULTS: DG Abd Portable 1V  Result Date: 12/18/2020 CLINICAL DATA:  Feeding tube placement EXAM: PORTABLE ABDOMEN - 1 VIEW COMPARISON:  09/01/2020 FINDINGS: Feeding tube terminates at the distal stomach. Cardiomegaly with median sternotomy and left base airspace disease. Non-obstructive bowel gas pattern. No gross free intraperitoneal air. IMPRESSION: Feeding tube terminating at the distal stomach. Electronically Signed   By: Abigail Miyamoto M.D.   On: 12/18/2020 16:44     LOS: 5 days   Oren Binet, MD  [facility]    To contact the attending provider between 7A-7P or the covering provider during after hours 7P-7A, please log into the web site [email or web address] and access using universal [place] password for that web site. If you do not have the password, please call the hospital operator.  12/20/2020, 11:57 AM

## 2020-12-20 NOTE — Progress Notes (Signed)
PT Cancellation Note  Patient Details Name: [name] [name] MRN: [id number] DOB: 11-22-1940   Cancelled Treatment:    Reason Eval/Treat Not Completed: PT screened, no needs identified, will sign off  Noted pt only responding to pain. Discussed with RN and agreed pt currently not appropriate for PT. Please reorder if pt becomes more alert.    Arby Barrette, PT Acute Rehabilitation Services  Pager [phone] Office [phone]    Rexanne Mano 12/20/2020, 1:50 PM

## 2020-12-20 NOTE — Anesthesia Postprocedure Evaluation (Signed)
Anesthesia Post Note  Patient: [name] [name]  Procedure(s) Performed: TRANSESOPHAGEAL ECHOCARDIOGRAM (TEE)     Patient location during evaluation: Endoscopy Anesthesia Type: MAC Level of consciousness: awake Pain management: pain level controlled Vital Signs Assessment: post-procedure vital signs reviewed and stable Respiratory status: spontaneous breathing, nonlabored ventilation, respiratory function stable and patient connected to nasal cannula oxygen Cardiovascular status: stable and blood pressure returned to baseline Postop Assessment: no apparent nausea or vomiting Anesthetic complications: no   No notable events documented.  Last Vitals:  Vitals:   12/20/20 1640 12/20/20 1946  BP: 102/68 112/67  Pulse: 74 79  Resp: 20 18  Temp: 37.1 C 36.9 C  SpO2: 100% 97%    Last Pain:  Vitals:   12/20/20 1946  TempSrc: Axillary  PainSc:                  [name]

## 2020-12-20 NOTE — Progress Notes (Signed)
OT Cancellation Note  Patient Details Name: [name] [name] MRN: [id number] DOB: [birth year]   Cancelled Treatment:    Reason Eval/Treat Not Completed: Other (comment)Noted pt only responding to pain. Pt currently not appropriate for OT. Please reorder if pt becomes more alert  [place], OT/L   Acute OT Clinical Specialist [place] Pager [phone] Office [phone]  12/20/2020, 1:54 PM

## 2020-12-20 NOTE — Progress Notes (Addendum)
STROKE TEAM PROGRESS NOTE   SUBJECTIVE (INTERVAL HISTORY) His wife is at the bedside, son is on the phone. Pt lying in bed, neuro no significant change from yesterday. Fluctuating mental status per family, sometimes barely open eyes on voice, not following commands, no interaction, but some times eyes open prolonged time, tracking, but not talking yet.   Discussed with family regarding to medication effect on mental status, will change keppra to vimpat, and will add amantadine to help arousal.    OBJECTIVE Temp:  [97.5 F (36.4 C)-99.6 F (37.6 C)] 98.5 F (36.9 C) (10/16 1946) Pulse Rate:  [71-81] 79 (10/16 1946) Cardiac Rhythm: Normal sinus rhythm;Heart block (10/16 1916) Resp:  [17-28] 18 (10/16 1946) BP: (93-125)/(60-74) 112/67 (10/16 1946) SpO2:  [95 %-100 %] 97 % (10/16 1946)  Recent Labs  Lab 12/20/20 0252 12/20/20 0728 12/20/20 1155 12/20/20 1642 12/20/20 2004  GLUCAP 197* 208* 156* 171* 158*   Recent Labs  Lab 12/16/20 0130 12/17/20 0210 12/18/20 0221 12/18/20 1210 12/18/20 1630 12/19/20 0610 12/19/20 1632 12/20/20 0153  NA 146* 144 143  --   --  139  --  137  K 3.6 3.9 3.7  --   --  3.5  --  4.2  CL 117* 117* 114*  --   --  108  --  109  CO2 22 22 24   --   --  23  --  23  GLUCOSE 145* 208* 152*  --   --  186*  --  190*  BUN 26* 23 18  --   --  16  --  15  CREATININE 0.84 0.79 0.78  --   --  0.76  --  0.69  CALCIUM 8.1* 7.7* 7.6*  --   --  7.7*  --  7.5*  MG 2.1 2.1 2.0 1.9 1.9 2.0 1.9 1.9  PHOS  --   --   --  2.8 3.1 2.7 2.4*  --    Recent Labs  Lab 12/14/20 1245 12/15/20 0508 12/16/20 0130 12/19/20 0610 12/20/20 0153  AST 63* 48* 56* 64* 60*  ALT 92* 75* 76* 77* 72*  ALKPHOS 68 65 66 68 61  BILITOT 0.5 0.7 0.5 0.4 0.5  PROT 6.8 5.8* 5.8* 5.7* 5.3*  ALBUMIN 2.3* 2.0* 1.9* 1.7* 1.6*   Recent Labs  Lab 12/14/20 1245 12/14/20 1504 12/15/20 0508 12/16/20 0130 12/18/20 0221 12/19/20 0610 12/20/20 0153  WBC 12.0*  --  11.1* 10.7* 9.1 10.1  9.1  NEUTROABS 9.3*  --   --  7.7  --   --   --   HGB 11.7*   < > 9.8* 9.9* 8.8* 8.8* 8.7*  HCT 36.8*   < > 31.6* 30.7* 27.3* 27.6* 26.8*  MCV 90.0  --  92.4 88.0 88.1 87.9 87.9  PLT 119*  --  108* 106* 132* 176 191   < > = values in this interval not displayed.   No results for input(s): CKTOTAL, CKMB, CKMBINDEX, TROPONINI in the last 168 hours. No results for input(s): LABPROT, INR in the last 72 hours. No results for input(s): COLORURINE, LABSPEC, [place], GLUCOSEU, HGBUR, BILIRUBINUR, KETONESUR, PROTEINUR, UROBILINOGEN, NITRITE, LEUKOCYTESUR in the last 72 hours.  Invalid input(s): APPERANCEUR     Component Value Date/Time   CHOL 119 08/08/2020 0625   CHOL 102 11/26/2013 0000   TRIG 105 08/08/2020 0625   TRIG 93 11/26/2013 0000   HDL 42 08/08/2020 0625   CHOLHDL 2.8 08/08/2020 0625   VLDL 21 08/08/2020  0625   LDLCALC 56 08/08/2020 0625   LDLCALC 58 11/26/2013 0000   Lab Results  Component Value Date   HGBA1C 7.3 (H) 12/17/2020      Component Value Date/Time   LABOPIA NONE DETECTED 08/05/2013 2207   COCAINSCRNUR NONE DETECTED 08/05/2013 2207   LABBENZ NONE DETECTED 08/05/2013 2207   AMPHETMU NONE DETECTED 08/05/2013 2207   THCU NONE DETECTED 08/05/2013 2207   LABBARB NONE DETECTED 08/05/2013 2207    No results for input(s): ETH in the last 168 hours.  I have personally reviewed the radiological images below and agree with the radiology interpretations.  CT Head Wo Contrast  Result Date: 12/14/2020 CLINICAL DATA:  Mental status change, unknown cause EXAM: CT HEAD WITHOUT CONTRAST TECHNIQUE: Contiguous axial images were obtained from the base of the skull through the vertex without intravenous contrast. 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 Old left parietal infarct. Findings are stable since prior study. Small right subdural hematoma again noted overlying the right frontoparietal lobe, 3 mm compared to 5 mm previously.  There is atrophy and chronic small vessel disease changes. Ventriculomegaly likely rib related to ex vacuo dilatation. No acute infarct or acute hemorrhage. Vascular: No hyperdense vessel or unexpected calcification. Skull: Prior right temporoparietal craniotomy. No acute calvarial abnormality. Sinuses/Orbits: No acute findings Other: None IMPRESSION: Small right subdural hematoma, 3 mm compared with 5 mm previously. No new hemorrhage. Old right MCA and left parietal infarcts, stable. Ventriculomegaly related to ex vacuo dilatation. Atrophy, chronic microvascular disease. No acute intracranial abnormality. Electronically Signed   By: Rolm Baptise M.D.   On: 12/14/2020 13:50   MR BRAIN WO CONTRAST  Result Date: 12/18/2020 CLINICAL DATA:  Mental status change, unknown cause EXAM: MRI HEAD WITHOUT CONTRAST TECHNIQUE: Multiplanar, multiecho pulse sequences of the brain and surrounding structures were obtained without intravenous contrast. COMPARISON:  08/29/2020 FINDINGS: Motion artifact is present. Brain: Small focus of reduced diffusion is present within the left cerebellar vermis. Diffusion hyperintensity in the region of prior right MCA infarct is probably artifactual. There is a 1.2 cm subacute hematoma of the anterior right superior frontal gyrus (series 6, image 18). Chronic right MCA territory infarction with chronic blood products. Ex vacuo dilatation of the adjacent right lateral ventricle. Chronic infarct on the left near junction of parietal, temporal, and occipital lobes also with chronic blood products. Additional confluent T2 hyperintensity in the supratentorial white matter is nonspecific but may reflect chronic microvascular ischemic changes. Disproportionate prominence of the lateral and third ventricles is unchanged. Thin extra-axial collection underlies craniotomy. Vascular: Major vessel flow voids at the skull base are preserved. Skull and upper cervical spine: Normal marrow signal is  preserved. Prior right craniotomy. Sinuses/Orbits: Minor mucosal thickening. No acute orbital abnormality. Other: Sella is unremarkable.  Mastoid air cells are clear. IMPRESSION: Small acute infarct left cerebellar vermis. Small subacute hematoma anterior right frontal lobe (isodense on CT). Stable chronic findings of infarcts, volume loss, and chronic microvascular ischemic changes. Electronically Signed   By: Macy Mis M.D.   On: 12/18/2020 12:49   DG Chest Port 1 View  Result Date: 12/14/2020 CLINICAL DATA:  Altered mental status EXAM: PORTABLE CHEST 1 VIEW COMPARISON:  Chest radiograph 08/07/2020 FINDINGS: Median sternotomy wires and mitral and aortic valve prostheses are stable. The cardiomediastinal silhouette is stable. There is calcified atherosclerotic plaque of the aortic arch. Lung volumes are low. There is no focal consolidation or pulmonary edema. There is no pleural effusion or pneumothorax. There  is no acute osseous abnormality. IMPRESSION: Low lung volumes. Otherwise, no radiographic evidence of acute cardiopulmonary process. Electronically Signed   By: Valetta Mole M.D.   On: 12/14/2020 13:01   DG Abd Portable 1V  Result Date: 12/18/2020 CLINICAL DATA:  Feeding tube placement EXAM: PORTABLE ABDOMEN - 1 VIEW COMPARISON:  09/01/2020 FINDINGS: Feeding tube terminates at the distal stomach. Cardiomegaly with median sternotomy and left base airspace disease. Non-obstructive bowel gas pattern. No gross free intraperitoneal air. IMPRESSION: Feeding tube terminating at the distal stomach. Electronically Signed   By: Abigail Miyamoto M.D.   On: 12/18/2020 16:44   EEG adult  Result Date: 12/17/2020 Lora Havens, MD     12/17/2020  1:00 PM Patient Name: CAPTAIN BLUCHER MRN: [id number] Epilepsy Attending: Lora Havens Referring Physician/Provider: Dr Oren Binet Date: 12/17/2020 Duration: 24.04 mins Patient history: 80 year old male with recent subdural hematoma in May, seizures who  presented with altered mental status.  EEG to evaluate for seizures. Level of alertness: Awake AEDs during EEG study: LEV Technical aspects: This EEG study was done with scalp electrodes positioned according to the 10-20 International system of electrode placement. Electrical activity was acquired at a sampling rate of 500Hz  and reviewed with a high frequency filter of 70Hz  and a low frequency filter of 1Hz . EEG data were recorded continuously and digitally stored. Description: The posterior dominant rhythm consists of 8-9 Hz activity of moderate voltage (25-35 uV) seen predominantly in posterior head regions, symmetric and reactive to eye opening and eye closing. EEG showed continuous low amplitude 2 to 3 Hz delta slowing in right frontotemporal region. Intermittent generalized 3 to 5 Hz theta-delta slowing was also noted. Hyperventilation and photic stimulation were not performed.   ABNORMALITY - Continuous slow, right frontotemporal region - Intermittent slow, generalized IMPRESSION: This study is suggestive of cortical dysfunction in right frontotemporal region likely secondary to underlying structural abnormality/stroke/subdural hemorrhage.  Additionally there is mild diffuse encephalopathy, nonspecific etiology.  No seizures or epileptiform discharges were seen throughout the recording.  If suspicion for ictal- interictal activity remains a concern, a prolonged study can be considered. Priyanka Barbra Sarks   Overnight EEG with video  Result Date: 12/18/2020 Lora Havens, MD     12/19/2020  7:36 AM Patient Name: ZVI DUPLANTIS MRN: [id number] Epilepsy Attending: Lora Havens Referring Physician/Provider: Dr Oren Binet Duration: 12/17/2020 1638 to 12/18/2020 1002  Patient history: 80 year old male with recent subdural hematoma in May, seizures who presented with altered mental status.  EEG to evaluate for seizures.  Level of alertness: Awake, asleep  AEDs during EEG study: LEV  Technical aspects:  This EEG study was done with scalp electrodes positioned according to the 10-20 International system of electrode placement. Electrical activity was acquired at a sampling rate of 500Hz  and reviewed with a high frequency filter of 70Hz  and a low frequency filter of 1Hz . EEG data were recorded continuously and digitally stored.  Description: The posterior dominant rhythm consists of 8-9 Hz activity of moderate voltage (25-35 uV) seen predominantly in posterior head regions, symmetric and reactive to eye opening and eye closing.  Sleep was characterized by vertex waves, sleep spindles (12 to 14 Hz), maximal frontocentral region.  EEG showed continuous low amplitude 2 to 3 Hz delta slowing in right frontotemporal region. Hyperventilation and photic stimulation were not performed.    ABNORMALITY - Continuous slow, right frontotemporal region  IMPRESSION: This study is suggestive of cortical dysfunction in right frontotemporal region likely secondary to  underlying structural abnormality/stroke/subdural hemorrhage. No seizures or epileptiform discharges were seen throughout the recording. Lora Havens   ECHOCARDIOGRAM COMPLETE  Result Date: 12/15/2020    ECHOCARDIOGRAM REPORT   Patient Name:   Jacky TRAVEON LOURO Date of Exam: 12/15/2020 Medical Rec #:  [id number]     Height:       67.0 in Accession #:    [id number]    Weight:       166.2 lb Date of Birth:  1940-11-14     BSA:          1.869 m Patient Age:    35 years      BP:           120/67 mmHg Patient Gender: M             HR:           98 bpm. Exam Location:  Inpatient Procedure: 2D Echo, Cardiac Doppler, Color Doppler and Intracardiac            Opacification Agent Indications:    Fever R50.9  History:        Patient has prior history of Echocardiogram examinations, most                 recent 08/07/2020. Stroke; Arrythmias:Atrial Fibrillation. Past                 history of rheumatic fever, acute rheumatic pericarditis.                 Thoracic aortic aneurysm.                  Aortic Valve: 27 mm Carpentier-Edwards prosthetic valve is                 present in the aortic position. Procedure Date: 10/18/2004.  Sonographer:    Darlina Sicilian RDCS Sonographer#2:  Darlina Sicilian RDCS Referring Phys: [id number] [place]  1. The mitral valve has been replaced with an unknown bioprosthesis. No evidence of mitral valve regurgitation. The mean mitral valve gradient is 10.0 mmHg with average heart rate of 101 bpm. PHT 70 ms. Suboptimal LVOT spectral Doppler- VTI deferred. Date of Procedure Date: 10/18/2004.  2. The aortic valve has been repaired/replaced. Aortic valve regurgitation is not visualized. 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 Procedure Date: 10/18/2004. Aortic valve mean gradient measures 16.0 mmHg.  Aortic valve acceleration time measures 74 msec.  3. Left ventricular ejection fraction, by estimation, is 65 to 70%. The left ventricle has normal function. The left ventricle has no regional wall motion abnormalities. There is mild concentric left ventricular hypertrophy. Left ventricular diastolic parameters are indeterminate.  4. Aortic dilatation noted. 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  5. Right ventricular systolic function is normal. The right ventricular size is normal. Comparison(s): A prior study was performed on 08/07/2020. Prior images reviewed side by side. Stable aortic root and ascending aorta size. Increased mitral valve gradients, but at higher hear rates from prior. FINDINGS  Left Ventricle: Left ventricular ejection fraction, by estimation, is 65 to 70%. The left ventricle has normal function. The left ventricle has no regional wall motion abnormalities. Definity contrast agent was given IV to delineate the left ventricular  endocardial borders. The left ventricular internal cavity size was  normal in size. There is mild concentric left ventricular  hypertrophy. Left ventricular diastolic parameters are indeterminate. Right Ventricle: The right ventricular size is normal. No increase in right ventricular wall thickness. Right ventricular systolic function is normal. Left Atrium: Left atrial size was normal in size. Right Atrium: Right atrial size was normal in size. Pericardium: There is no evidence of pericardial effusion. Mitral Valve: The mitral valve has been repaired/replaced. No evidence of mitral valve regurgitation. There is a 29 mm Carpentier-Edwards prosthetic present in the mitral position. MV peak gradient, 17.1 mmHg. The mean mitral valve gradient is 10.0 mmHg with average heart rate of 101 bpm. Tricuspid Valve: The tricuspid valve is grossly normal. Tricuspid valve regurgitation is mild . No evidence of tricuspid stenosis. Aortic Valve: The aortic valve has been repaired/replaced. Aortic valve regurgitation is not visualized. Aortic valve mean gradient measures 16.0 mmHg. Aortic valve peak gradient measures 24.4 mmHg. Aortic valve area, by VTI measures 1.56 cm. There is a  27 mm Carpentier-Edwards prosthetic valve present in the aortic position. Procedure Date: 10/18/2004. Pulmonic Valve: The pulmonic valve was normal in structure. Pulmonic valve regurgitation is mild. No evidence of pulmonic stenosis. Aorta: The ascending aorta was not well visualized and aortic dilatation noted. 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 IAS/Shunts: The atrial septum is grossly normal.  LEFT VENTRICLE PLAX 2D LVIDd:         4.80 cm LVIDs:         3.50 cm LV PW:         1.30 cm LV IVS:        1.30 cm LVOT diam:     3.25 cm LV SV:         62 LV SV Index:   33 LVOT Area:     8.30 cm  LV Volumes (MOD) LV vol d, MOD A2C: 81.2 ml LV vol d, MOD A4C: 94.4 ml LV vol s, MOD A2C: 18.0 ml LV vol s, MOD A4C: 30.1 ml LV SV  MOD A2C:     63.2 ml LV SV MOD A4C:     94.4 ml LV SV MOD BP:      63.9 ml RIGHT VENTRICLE RV S prime:     10.40 cm/s LEFT ATRIUM             Index        RIGHT ATRIUM          Index LA diam:        4.50 cm 2.41 cm/m   RA Area:     8.74 cm LA Vol (A2C):   58.1 ml 31.08 ml/m  RA Volume:   12.60 ml 6.74 ml/m LA Vol (A4C):   56.9 ml 30.44 ml/m LA Biplane Vol: 58.9 ml 31.51 ml/m  AORTIC VALVE AV Area (Vmax):    5.29 cm AV Area (Vmean):   1.55 cm AV Area (VTI):     1.56 cm AV Vmax:           247.00 cm/s AV Vmean:          190.500 cm/s AV VTI:            0.401 m AV Peak Grad:      24.4 mmHg AV Mean Grad:      16.0 mmHg LVOT Vmax:         157.40 cm/s LVOT Vmean:        35.700 cm/s LVOT VTI:  0.075 m LVOT/AV VTI ratio: 0.19  AORTA Ao Asc diam: 4.90 cm MITRAL VALVE                TRICUSPID VALVE MV Area (PHT): 3.16 cm     TR Peak grad:   26.4 mmHg MV Area VTI:   1.39 cm     TR Vmax:        257.00 cm/s MV Peak grad:  17.1 mmHg MV Mean grad:  10.0 mmHg    SHUNTS MV Vmax:       2.07 m/s     Systemic VTI:  0.08 m MV Vmean:      151.0 cm/s   Systemic Diam: 3.25 cm MV Decel Time: 240 msec MV E velocity: 195.00 cm/s Rudean Haskell MD Electronically signed by Rudean Haskell MD Signature Date/Time: 12/15/2020/12:49:20 PM    Final       PHYSICAL EXAM  Temp:  [97.5 F (36.4 C)-99.6 F (37.6 C)] 98.5 F (36.9 C) (10/16 1946) Pulse Rate:  [71-81] 79 (10/16 1946) Resp:  [17-28] 18 (10/16 1946) BP: (93-125)/(60-74) 112/67 (10/16 1946) SpO2:  [95 %-100 %] 97 % (10/16 1946)  General - Well nourished, well developed, in no apparent distress.  Ophthalmologic - fundi not visualized due to noncooperation.  Cardiovascular - Regular rhythm and rate, not in afib.  Neuro - eyes closed, with repetitive stimulation, he was able to open eyes and able to maintain eyes open for several minutes.  However, nonverbal, not follow commands.  Able to blinking spontaneous, however not blinking to visual threat  bilaterally.  No tracking.  Not interactive.  Mild left facial droop.  Tongue protrusion not corporative.  Left upper and lower extremity hemiplegia except left lower extremity slight withdraw with pain stimulation.  Right upper extremity localizing to pain, purposeful barely against gravity.  Right lower extremity no spontaneous movement, however withdrawal to pain 2/5. Sensation, coordination and gait not tested.   ASSESSMENT/PLAN Mr. FERRON ISHMAEL is a 80 y.o. 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 fib status post maze procedure and LAA clipping, stroke admitted for lethargy, weight loss, bacteremia and altered mental status. No tPA given due to outside window.    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 and right SDH status post bur hole evacuation Leukocytosis improving, WBC 12.0 -11.1-10.7-9.1-10.1-9.1 AST/ALT 48/75->56/76->64/77->60/72 Afebrile EEG and long-term EEG no seizure Supportive care, avoid complications PT/OT Would like to continue monitoring for 3-4 days and reassess mental status after endocarditis adequately controlled Had long discussion with family, will change keppra to vimpat to eliminate sedative effect Also will add amantadine 200 bid for arousal  Stroke:  left small AICA infarct embolic pattern, 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  Ongoing aggressive stroke risk factor management Therapy recommendations: Pending Disposition: Pending  Bacteremia Endocarditis UTI ID on board TEE showed small mitral wall vegetation Blood culture positive for  Enterococcus->repeat Cx 10/13 1/2 positive->repeat Cx 10/15 NGTD Urine culture positive for staph aureus Currently on ampicillin and Rocephin  PAF Status post maze and left AA clipping Was on Coumadin before Now on Eliquis and [facility] on board  History of stroke,  SDH and seizure Stroke in 08/2013 07/27/2020 admitted for traumatic right large SDH status post bur hole evacuation.  Coumadin was on hold due to SDH 08/07/2020 admitted for altered mental status with left-sided weakness, CT showed right MCA large infarct.  CTA head and neck right ICA and right M2 occlusion status post IR with TICI2c revascularization.  EF 60 to 65%, LDL 56, A1c 6.2, patient discharged to [facility].  Started Eliquis on 08/25/2020 in CIR. 08/28/2020 had a seizure activity, MRI no significant change, put on Keppra. Current admission, EEG and long-term EEG no seizure.  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 Sloan Leiter. I spent  35 minutes in total face-to-face time with the patient, more than 50% of which was spent in counseling and coordination of care, reviewing test results, images and medication, and discussing the diagnosis, treatment plan and potential prognosis. This patient's care requiresreview of multiple databases, neurological assessment, discussion with family, other specialists and medical decision making of high complexity. I had long discussion with wife at bedside as well as son over the phone, updated pt current condition, treatment  plan and potential prognosis, and answered all the questions. They expressed understanding and appreciation.    Rosalin Hawking, MD PhD Stroke Neurology 12/20/2020 10:59 PM    To contact Stroke Continuity provider, please refer to [email or web address] After hours, contact General Neurology

## 2020-12-21 DIAGNOSIS — T826XXD Infection and inflammatory reaction due to cardiac valve prosthesis, subsequent encounter: Secondary | ICD-10-CM

## 2020-12-21 DIAGNOSIS — E43 Unspecified severe protein-calorie malnutrition: Secondary | ICD-10-CM

## 2020-12-21 DIAGNOSIS — I38 Endocarditis, valve unspecified: Secondary | ICD-10-CM | POA: Diagnosis not present

## 2020-12-21 DIAGNOSIS — T826XXA Infection and inflammatory reaction due to cardiac valve prosthesis, initial encounter: Principal | ICD-10-CM

## 2020-12-21 DIAGNOSIS — Z8673 Personal history of transient ischemic attack (TIA), and cerebral infarction without residual deficits: Secondary | ICD-10-CM | POA: Diagnosis not present

## 2020-12-21 DIAGNOSIS — Z9889 Other specified postprocedural states: Secondary | ICD-10-CM | POA: Diagnosis not present

## 2020-12-21 DIAGNOSIS — I330 Acute and subacute infective endocarditis: Secondary | ICD-10-CM

## 2020-12-21 DIAGNOSIS — R7881 Bacteremia: Secondary | ICD-10-CM | POA: Diagnosis not present

## 2020-12-21 LAB — COMPREHENSIVE METABOLIC PANEL
ALT: 86 U/L — ABNORMAL HIGH (ref 0–44)
AST: 72 U/L — ABNORMAL HIGH (ref 15–41)
Albumin: 1.5 g/dL — ABNORMAL LOW (ref 3.5–5.0)
Alkaline Phosphatase: 69 U/L (ref 38–126)
Anion gap: 7 (ref 5–15)
BUN: 15 mg/dL (ref 8–23)
CO2: 24 mmol/L (ref 22–32)
Calcium: 7.5 mg/dL — ABNORMAL LOW (ref 8.9–10.3)
Chloride: 104 mmol/L (ref 98–111)
Creatinine, Ser: 0.65 mg/dL (ref 0.61–1.24)
GFR, Estimated: >60 mL/min (ref >60)
Glucose, Bld: 171 mg/dL — ABNORMAL HIGH (ref 70–99)
Potassium: 4 mmol/L (ref 3.5–5.1)
Sodium: 135 mmol/L (ref 135–145)
Total Bilirubin: 0.3 mg/dL (ref 0.3–1.2)
Total Protein: 5.5 g/dL — ABNORMAL LOW (ref 6.5–8.1)

## 2020-12-21 LAB — CBC
HCT: 26.6 % — ABNORMAL LOW (ref 39.0–52.0)
Hemoglobin: 8.8 g/dL — ABNORMAL LOW (ref 13.0–17.0)
MCH: 28.8 pg (ref 26.0–34.0)
MCHC: 33.1 g/dL (ref 30.0–36.0)
MCV: 86.9 fL (ref 80.0–100.0)
Platelets: 192 10*3/uL (ref 150–400)
RBC: 3.06 MIL/uL — ABNORMAL LOW (ref 4.22–5.81)
RDW: 16.1 % — ABNORMAL HIGH (ref 11.5–15.5)
WBC: 9.9 10*3/uL (ref 4.0–10.5)
nRBC: 0 % (ref 0.0–0.2)

## 2020-12-21 LAB — GLUCOSE, CAPILLARY
Glucose-Capillary: 191 mg/dL — ABNORMAL HIGH (ref 70–99)
Glucose-Capillary: 190 mg/dL — ABNORMAL HIGH (ref 70–99)
Glucose-Capillary: 193 mg/dL — ABNORMAL HIGH (ref 70–99)
Glucose-Capillary: 172 mg/dL — ABNORMAL HIGH (ref 70–99)
Glucose-Capillary: 163 mg/dL — ABNORMAL HIGH (ref 70–99)
Glucose-Capillary: 180 mg/dL — ABNORMAL HIGH (ref 70–99)

## 2020-12-21 MED ORDER — ATORVASTATIN CALCIUM 80 MG PO TABS
80.0000 mg | ORAL_TABLET | Freq: Every day | ORAL | Status: DC
  Administered 2020-12-21 – 2020-12-22 (×2): 80 mg
  Filled 2020-12-21 (×2): qty 1

## 2020-12-21 MED ORDER — LACOSAMIDE 50 MG PO TABS
50.0000 mg | ORAL_TABLET | Freq: Two times a day (BID) | ORAL | Status: DC
  Administered 2020-12-21 – 2020-12-29 (×17): 50 mg
  Filled 2020-12-21 (×17): qty 1

## 2020-12-21 MED ORDER — LACOSAMIDE 10 MG/ML PO SOLN
50.0000 mg | Freq: Two times a day (BID) | ORAL | Status: DC
  Administered 2020-12-21: 50 mg
  Filled 2020-12-21 (×2): qty 5

## 2020-12-21 MED ORDER — MAGNESIUM GLUCONATE 500 MG PO TABS
500.0000 mg | ORAL_TABLET | Freq: Every day | ORAL | Status: DC
  Administered 2020-12-21 – 2020-12-29 (×9): 500 mg
  Filled 2020-12-21 (×11): qty 1

## 2020-12-21 NOTE — Progress Notes (Signed)
PROGRESS NOTE        PATIENT DETAILS Name: [name] [name] Age: 80 y.o. Sex: male Date of Birth: [birth year] Admit Date: 12/14/2020 Admitting Physician Georgette Shell, MD SJG:GEZMO, Arvid Right, MD  Brief Narrative: Patient is a 80 y.o. 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  See below for further details.    Subjective: Unchanged-awakes-not interactive.  Still very sleepy/lethargic   Objective: Vitals: Blood pressure 103/71, pulse 83, temperature 98.9 F (37.2 C), temperature source Axillary, resp. rate (!) 24, height 5\' 7"  (1.702 m), weight 83.9 kg, SpO2 97 %.   Exam: Gen Exam: Lethargic-opens eyes-not interactive. HEENT:atraumatic, normocephalic Chest: B/L clear to auscultation anteriorly CVS:S1S2 regular Abdomen:soft non tender, non distended Extremities:no edema Neurology: Left sided hemiplegia unchanged.   Skin: no rash   Pertinent Labs/Radiology: Na: 135 K: 4.0  creatinine: 0.65  WBC: 9.9 Hb: 8.8  platelet: 192  10/10>>Blood culture: Enterococcus Enterococcus faecalis 10/10>> urine culture: Staff aureus 10/11>> blood culture: 1/2-Enterococcus faecalis 10/13>> blood culture: 1/2-Enterococcus faecalis 10/15>> blood culture: No growth  10/10>> CT head: Small right subdural hematoma 10/11>> TTE: EF-65-70%, no obvious vegetation. 10/14>> TEE: Prosthetic mitral valve vegetation 10/14>> MRI brain: Acute infarct left cerebellar vermis, small subacute hematoma in the anterior right frontal  lobe.  10/13-2010/14>> LTM EEG: No seizures.  Assessment/Plan: Acute metabolic encephalopathy: Due to hypernatremia/enterococcal bacteremia-continues to be encephalopathic/lethargic-not very interactive in spite of treatment of underlying hypernatremia and enterococcal bacteremia.  Neuroimaging/LTM EEG as above.  After discussion with stroke MD-have switched Keppra to Vimpat, and have started on amantadine 200 mg twice daily.  Plan is to continue supportive care-watch for any signs of improvement-and reassess in the next 2-3 days.  If no improvement-May need to consider involvement of palliative care services-but goals of care outlined below.  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  On Rocephin/ampicillin.  ID following.      Hypernatremia: Resolved with IVF-currently with NG tube feedings.  PAF: Continue Tikosyn-remains on Eliquis.  Acute left cerebellar vermis CVA: Felt to be a septic emboli in the setting of mitral valve endocarditis.    History of seizure disorder: Initially on Keppra-given ongoing lethargy/encephalopathy-switch to Vimpat on 10/6.  LTM EEG negative for seizures.    Recent SDH May 2022: From signout from prior MD-Dr. 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  Continues to have significant left-sided hemiparesis.  On Eliquis.  History of rheumatic heart disease-s/p remote AVR/MVR (bioprosthetic valve)  Severe failure to thrive syndrome/frailty/poor oral intake/goals of care: Continues to be lethargic-no significant oral intake over the past few days-cortak inserted on 10/14-on feeds.  DNR  in place-family does not desire aggressive care-but if patient encephalopathy improves-starts interacting with family (even if bedbound )-patient's family not adverse to placing PEG tube (if oral intake continues to be  poor) .  If patient does not improve-if family willing consider hospice measures at some point.  Plan is to watch/observe over the next few days-continue IV antibiotics/NG tube feedings-medication adjustments-and see if patient improves-family wishes to hold off on palliative care consultation as goals of care are very clear and defined as above.  Nutrition Status: Nutrition Problem: Severe Malnutrition Etiology: chronic illness (CHF, Stroke) Signs/Symptoms: percent weight loss, moderate fat depletion, severe muscle depletion Percent weight loss: 12 % Interventions: Tube feeding, Prostat, MVI    Procedures: None Consults: None DVT Prophylaxis: Eliquis Code Status:DNR Family Communication: [name] at bedside-10/15-son as usual was on the phone.  Time spent: 35 minutes-Greater than 50% of this time was spent in counseling, explanation of diagnosis, planning of further management, and coordination of care.  Diet: Diet Order             Diet NPO time specified Except for: Sips with Meds  Diet effective now                      Disposition Plan: Status is: Inpatient  Remains inpatient appropriate because:Inpatient level of care appropriate due to severity of illness  Dispo: The patient is from: SNF              Anticipated d/c is to: SNF              Patient currently is not medically stable to d/c.   Difficult to place patient No    Barriers to Discharge: Persistent encephalopathy-persistent bacteremia with Enterococcus-on IV antibiotics-NG tube feedings ongoing due to poor oral intake.  Antimicrobial agents: Anti-infectives (From admission, onward)    Start     Dose/Rate Route Frequency Ordered Stop   12/18/20 1500  cefTRIAXone (ROCEPHIN) 2 g in  sodium chloride 0.9 % 100 mL IVPB        2 g 200 mL/hr over 30 Minutes Intravenous Every 12 hours 12/18/20 1344     12/15/20 1700  cefTRIAXone (ROCEPHIN) 1 g in sodium chloride 0.9 % 100 mL IVPB  Status:  Discontinued        1 g 200 mL/hr over 30 Minutes Intravenous Every 24 hours 12/14/20 1648 12/15/20 0624   12/15/20 0630  ampicillin (OMNIPEN) 2 g in sodium chloride 0.9 % 100 mL IVPB        2 g 300 mL/hr over 20 Minutes Intravenous Every 6 hours 12/15/20 0624     12/15/20 0630  gentamicin (GARAMYCIN) IVPB 80 mg        80 mg 100 mL/hr over 30 Minutes Intravenous  Once 12/15/20 0628 12/15/20 0732   12/14/20 1645  cefTRIAXone (ROCEPHIN) 1 g in sodium chloride 0.9 % 100 mL IVPB        1 g 200 mL/hr over 30 Minutes Intravenous  Once 12/14/20 1642 12/14/20 1830        MEDICATIONS: Scheduled Meds:  amantadine  200 mg Per Tube BID   apixaban  5 mg Per Tube BID   atorvastatin  80 mg Per Tube Daily   dofetilide  500 mcg Oral BID   feeding supplement (PROSource TF)  45 mL Per Tube Daily   finasteride  5 mg Oral Daily   free water  125 mL Per Tube Q4H   insulin aspart  0-9 Units Subcutaneous TID WC   lacosamide  50 mg Per Tube BID   magnesium gluconate  500 mg Per Tube Daily   mouth rinse  15 mL Mouth Rinse  BID   multivitamin with minerals  1 tablet Per Tube Daily   sodium chloride flush  3 mL Intravenous Q12H   tamsulosin  0.4 mg Oral Daily   Continuous Infusions:  sodium chloride Stopped (12/20/20 1219)   sodium chloride     ampicillin (OMNIPEN) IV 2 g (12/21/20 0808)   cefTRIAXone (ROCEPHIN)  IV 2 g (12/21/20 0932)   feeding supplement (OSMOLITE 1.2 CAL) 75 mL/hr at 12/21/20 0135   PRN Meds:.sodium chloride, sodium chloride flush   I have personally reviewed following labs and imaging studies  LABORATORY DATA: CBC: Recent Labs  Lab 12/14/20 1245 12/14/20 1504 12/16/20 0130 12/18/20 0221 12/19/20 0610 12/20/20 0153 12/21/20 0151  WBC 12.0*   < > 10.7* 9.1 10.1 9.1  9.9  NEUTROABS 9.3*  --  7.7  --   --   --   --   HGB 11.7*   < > 9.9* 8.8* 8.8* 8.7* 8.8*  HCT 36.8*   < > 30.7* 27.3* 27.6* 26.8* 26.6*  MCV 90.0   < > 88.0 88.1 87.9 87.9 86.9  PLT 119*   < > 106* 132* 176 191 192   < > = values in this interval not displayed.     Basic Metabolic Panel: Recent Labs  Lab 12/17/20 0210 12/18/20 0221 12/18/20 1210 12/18/20 1630 12/19/20 0610 12/19/20 1632 12/20/20 0153 12/21/20 0151  NA 144 143  --   --  139  --  137 135  K 3.9 3.7  --   --  3.5  --  4.2 4.0  CL 117* 114*  --   --  108  --  109 104  CO2 22 24  --   --  23  --  23 24  GLUCOSE 208* 152*  --   --  186*  --  190* 171*  BUN 23 18  --   --  16  --  15 15  CREATININE 0.79 0.78  --   --  0.76  --  0.69 0.65  CALCIUM 7.7* 7.6*  --   --  7.7*  --  7.5* 7.5*  MG 2.1 2.0 1.9 1.9 2.0 1.9 1.9  --   PHOS  --   --  2.8 3.1 2.7 2.4*  --   --      GFR: Estimated Creatinine Clearance: 78.8 mL/min (by C-G formula based on SCr of 0.65 mg/dL).  Liver Function Tests: Recent Labs  Lab 12/15/20 0508 12/16/20 0130 12/19/20 0610 12/20/20 0153 12/21/20 0151  AST 48* 56* 64* 60* 72*  ALT 75* 76* 77* 72* 86*  ALKPHOS 65 66 68 61 69  BILITOT 0.7 0.5 0.4 0.5 0.3  PROT 5.8* 5.8* 5.7* 5.3* 5.5*  ALBUMIN 2.0* 1.9* 1.7* 1.6* 1.5*    No results for input(s): LIPASE, AMYLASE in the last 168 hours. Recent Labs  Lab 12/14/20 1639  AMMONIA 25     Coagulation Profile: Recent Labs  Lab 12/14/20 1245 12/15/20 0508  INR 1.5* 1.5*     Cardiac Enzymes: No results for input(s): CKTOTAL, CKMB, CKMBINDEX, TROPONINI in the last 168 hours.  BNP (last 3 results) No results for input(s): PROBNP in the last 8760 hours.  Lipid Profile: No results for input(s): CHOL, HDL, LDLCALC, TRIG, CHOLHDL, LDLDIRECT in the last 72 hours.  Thyroid Function Tests: No results for input(s): TSH, T4TOTAL, FREET4, T3FREE, THYROIDAB in the last 72 hours.   Anemia Panel: No results for input(s): VITAMINB12,  FOLATE, FERRITIN, TIBC, IRON, RETICCTPCT in  the last 72 hours.  Urine analysis:    Component Value Date/Time   COLORURINE YELLOW 12/14/2020 1600   APPEARANCEUR CLOUDY (A) 12/14/2020 1600   LABSPEC 1.020 12/14/2020 1600   PHURINE 6.0 12/14/2020 1600   GLUCOSEU NEGATIVE 12/14/2020 1600   HGBUR LARGE (A) 12/14/2020 1600   BILIRUBINUR NEGATIVE 12/14/2020 1600   BILIRUBINUR negative 11/22/2013 0848   KETONESUR NEGATIVE 12/14/2020 1600   PROTEINUR NEGATIVE 12/14/2020 1600   UROBILINOGEN 0.2 11/22/2013 0848   UROBILINOGEN 1.0 08/05/2013 2207   NITRITE POSITIVE (A) 12/14/2020 1600   LEUKOCYTESUR LARGE (A) 12/14/2020 1600    Sepsis Labs: Lactic Acid, Venous    Component Value Date/Time   LATICACIDVEN 1.8 12/14/2020 2056    MICROBIOLOGY: Recent Results (from the past 240 hour(s))  Culture, blood (Routine x 2)     Status: Abnormal   Collection Time: 12/14/20 12:45 PM   Specimen: BLOOD RIGHT FOREARM  Result Value Ref Range Status   Specimen Description BLOOD RIGHT FOREARM  Final   Special Requests   Final    BOTTLES DRAWN AEROBIC AND ANAEROBIC Blood Culture adequate volume   Culture  Setup Time   Final    GRAM POSITIVE COCCI IN BOTH AEROBIC AND ANAEROBIC BOTTLES CRITICAL RESULT CALLED TO, READ BACK BY AND VERIFIED WITH: PHARMD JAMES LEDFORD 12/15/2020@6 :07 BY TW Performed at [facility], [place] [address], [place], [place] [address]    Culture ENTEROCOCCUS FAECALIS (A)  Final   Report Status 12/17/2020 FINAL  Final   Organism ID, Bacteria ENTEROCOCCUS FAECALIS  Final      Susceptibility   Enterococcus faecalis - MIC*    AMPICILLIN <=2 SENSITIVE Sensitive     VANCOMYCIN 2 SENSITIVE Sensitive     GENTAMICIN SYNERGY SENSITIVE Sensitive     * ENTEROCOCCUS FAECALIS  Blood Culture ID Panel (Reflexed)     Status: Abnormal   Collection Time: 12/14/20 12:45 PM  Result Value Ref Range Status   Enterococcus faecalis DETECTED (A) NOT DETECTED Final    Comment: CRITICAL RESULT  CALLED TO, READ BACK BY AND VERIFIED WITH: PHARMD JAMES LEDFORD 12/15/2020@6 :07 BY TW    Enterococcus Faecium NOT DETECTED NOT DETECTED Final   Listeria monocytogenes NOT DETECTED NOT DETECTED Final   Staphylococcus species NOT DETECTED NOT DETECTED Final   Staphylococcus aureus (BCID) NOT DETECTED NOT DETECTED Final   Staphylococcus epidermidis NOT DETECTED NOT DETECTED Final   Staphylococcus lugdunensis NOT DETECTED NOT DETECTED Final   Streptococcus species NOT DETECTED NOT DETECTED Final   Streptococcus agalactiae NOT DETECTED NOT DETECTED Final   Streptococcus pneumoniae NOT DETECTED NOT DETECTED Final   Streptococcus pyogenes NOT DETECTED NOT DETECTED Final   A.calcoaceticus-baumannii NOT DETECTED NOT DETECTED Final   Bacteroides fragilis NOT DETECTED NOT DETECTED Final   Enterobacterales NOT DETECTED NOT DETECTED Final   Enterobacter cloacae complex NOT DETECTED NOT DETECTED Final   Escherichia coli NOT DETECTED NOT DETECTED Final   Klebsiella aerogenes NOT DETECTED NOT DETECTED Final   Klebsiella oxytoca NOT DETECTED NOT DETECTED Final   Klebsiella pneumoniae NOT DETECTED NOT DETECTED Final   Proteus species NOT DETECTED NOT DETECTED Final   Salmonella species NOT DETECTED NOT DETECTED Final   Serratia marcescens NOT DETECTED NOT DETECTED Final   Haemophilus influenzae NOT DETECTED NOT DETECTED Final   Neisseria meningitidis NOT DETECTED NOT DETECTED Final   Pseudomonas aeruginosa NOT DETECTED NOT DETECTED Final   Stenotrophomonas maltophilia NOT DETECTED NOT DETECTED Final   Candida albicans NOT DETECTED NOT DETECTED Final  Candida auris NOT DETECTED NOT DETECTED Final   Candida glabrata NOT DETECTED NOT DETECTED Final   Candida krusei NOT DETECTED NOT DETECTED Final   Candida parapsilosis NOT DETECTED NOT DETECTED Final   Candida tropicalis NOT DETECTED NOT DETECTED Final   Cryptococcus neoformans/gattii NOT DETECTED NOT DETECTED Final   Vancomycin resistance NOT  DETECTED NOT DETECTED Final    Comment: Performed at [facility] [address] [place], [place] [address]  Resp Panel by RT-PCR (Flu A&B, Covid) Nasopharyngeal Swab     Status: None   Collection Time: 12/14/20  3:41 PM   Specimen: Nasopharyngeal Swab; Nasopharyngeal(NP) swabs in vial transport medium  Result Value Ref Range Status   SARS Coronavirus 2 by RT PCR NEGATIVE NEGATIVE Final    Comment: (NOTE) SARS-CoV-2 target nucleic acids are NOT DETECTED.  The SARS-CoV-2 RNA is generally detectable in upper respiratory specimens during the acute phase of infection. The lowest concentration of SARS-CoV-2 viral copies this assay can detect is 138 copies/mL. A negative result does not preclude SARS-Cov-2 infection and should not be used as the sole basis for treatment or other patient management decisions. A negative result may occur with  improper specimen collection/handling, submission of specimen other than nasopharyngeal swab, presence of viral mutation(s) within the areas targeted by this assay, and inadequate number of viral copies(<138 copies/mL). A negative result must be combined with clinical observations, patient history, and epidemiological information. The expected result is Negative.  Fact Sheet for Patients:  EntrepreneurPulse.com.au  Fact Sheet for Healthcare Providers:  IncredibleEmployment.be  This test is no t yet approved or cleared by the [place] FDA and  has been authorized for detection and/or diagnosis of SARS-CoV-2 by FDA under an Emergency Use Authorization (EUA). This EUA will remain  in effect (meaning this test can be used) for the duration of the COVID-19 declaration under Section 564(b)(1) of the Act, 21 U.S.C.section 360bbb-3(b)(1), unless the authorization is terminated  or revoked sooner.       Influenza A by PCR NEGATIVE NEGATIVE Final   Influenza B by PCR NEGATIVE NEGATIVE Final    Comment:  (NOTE) The Xpert Xpress SARS-CoV-2/FLU/RSV plus assay is intended as an aid in the diagnosis of influenza from Nasopharyngeal swab specimens and should not be used as a sole basis for treatment. Nasal washings and aspirates are unacceptable for Xpert Xpress SARS-CoV-2/FLU/RSV testing.  Fact Sheet for Patients: EntrepreneurPulse.com.au  Fact Sheet for Healthcare Providers: IncredibleEmployment.be  This test is not yet approved or cleared by the [place] FDA and has been authorized for detection and/or diagnosis of SARS-CoV-2 by FDA under an Emergency Use Authorization (EUA). This EUA will remain in effect (meaning this test can be used) for the duration of the COVID-19 declaration under Section 564(b)(1) of the Act, 21 U.S.C. section 360bbb-3(b)(1), unless the authorization is terminated or revoked.  Performed at [facility], [place] [address], [place], [place] [address]   Culture, blood (Routine x 2)     Status: Abnormal   Collection Time: 12/14/20  4:39 PM   Specimen: BLOOD  Result Value Ref Range Status   Specimen Description BLOOD RIGHT HAND  Final   Special Requests   Final    BOTTLES DRAWN AEROBIC AND ANAEROBIC Blood Culture results may not be optimal due to an inadequate volume of blood received in culture bottles   Culture  Setup Time   Final    GRAM POSITIVE COCCI IN CHAINS IN BOTH AEROBIC AND ANAEROBIC BOTTLES  CRITICAL VALUE NOTED.  VALUE IS CONSISTENT WITH PREVIOUSLY REPORTED AND CALLED VALUE.    Culture (A)  Final    ENTEROCOCCUS FAECALIS SUSCEPTIBILITIES PERFORMED ON PREVIOUS CULTURE WITHIN THE LAST 5 DAYS. Performed at [facility], [place] [address] [place], [place] [address]    Report Status 12/17/2020 FINAL  Final  Urine Culture     Status: Abnormal   Collection Time: 12/14/20  4:43 PM   Specimen: Urine, Clean Catch  Result Value Ref Range Status   Specimen Description URINE, CLEAN CATCH  Final   Special  Requests   Final    NONE Performed at [facility], [place] [address], [place], [place] [address]    Culture >=100,000 COLONIES/mL STAPHYLOCOCCUS AUREUS (A)  Final   Report Status 12/17/2020 FINAL  Final   Organism ID, Bacteria STAPHYLOCOCCUS AUREUS (A)  Final      Susceptibility   Staphylococcus aureus - MIC*    CIPROFLOXACIN <=0.5 SENSITIVE Sensitive     GENTAMICIN <=0.5 SENSITIVE Sensitive     NITROFURANTOIN 32 SENSITIVE Sensitive     OXACILLIN <=0.25 SENSITIVE Sensitive     TETRACYCLINE <=1 SENSITIVE Sensitive     VANCOMYCIN <=0.5 SENSITIVE Sensitive     TRIMETH/SULFA <=10 SENSITIVE Sensitive     CLINDAMYCIN <=0.25 SENSITIVE Sensitive     RIFAMPIN <=0.5 SENSITIVE Sensitive     Inducible Clindamycin NEGATIVE Sensitive     * >=100,000 COLONIES/mL STAPHYLOCOCCUS AUREUS  Culture, blood (routine x 2)     Status: Abnormal   Collection Time: 12/15/20 11:20 AM   Specimen: BLOOD RIGHT ARM  Result Value Ref Range Status   Specimen Description BLOOD RIGHT ARM  Final   Special Requests   Final    BOTTLES DRAWN AEROBIC AND ANAEROBIC Blood Culture adequate volume   Culture  Setup Time   Final    GRAM POSITIVE COCCI ANAEROBIC BOTTLE ONLY CRITICAL VALUE NOTED.  VALUE IS CONSISTENT WITH PREVIOUSLY REPORTED AND CALLED VALUE.    Culture (A)  Final    ENTEROCOCCUS FAECALIS SUSCEPTIBILITIES PERFORMED ON PREVIOUS CULTURE WITHIN THE LAST 5 DAYS. Performed at [facility], [place] [address], [place], [place] [address]    Report Status 12/19/2020 FINAL  Final  Culture, blood (routine x 2)     Status: None   Collection Time: 12/15/20 11:25 AM   Specimen: BLOOD LEFT ARM  Result Value Ref Range Status   Specimen Description BLOOD LEFT ARM  Final   Special Requests   Final    BOTTLES DRAWN AEROBIC ONLY Blood Culture results may not be optimal due to an inadequate volume of blood received in culture bottles   Culture   Final    NO GROWTH 5 DAYS Performed at [facility], [place]  [address] [place], [place] [address]    Report Status 12/20/2020 FINAL  Final  MRSA Next Gen by PCR, Nasal     Status: None   Collection Time: 12/16/20  4:34 AM   Specimen: Nasal Mucosa; Nasal Swab  Result Value Ref Range Status   MRSA by PCR Next Gen NOT DETECTED NOT DETECTED Final    Comment: (NOTE) The GeneXpert MRSA Assay (FDA approved for NASAL specimens only), is one component of a comprehensive MRSA colonization surveillance program. It is not intended to diagnose MRSA infection nor to guide or monitor treatment for MRSA infections. Test performance is not FDA approved in patients less than 21 years old. Performed at [facility], [place]  [address] [place], [place] [address]   Culture, blood (routine x 2)     Status: Abnormal   Collection Time: 12/17/20  8:01 AM   Specimen: BLOOD RIGHT HAND  Result Value Ref Range Status   Specimen Description BLOOD RIGHT HAND  Final   Special Requests   Final    BOTTLES DRAWN AEROBIC AND ANAEROBIC Blood Culture adequate volume   Culture  Setup Time   Final    GRAM POSITIVE COCCI IN CHAINS ANAEROBIC BOTTLE ONLY CRITICAL VALUE NOTED.  VALUE IS CONSISTENT WITH PREVIOUSLY REPORTED AND CALLED VALUE.    Culture (A)  Final    ENTEROCOCCUS FAECALIS SUSCEPTIBILITIES PERFORMED ON PREVIOUS CULTURE WITHIN THE LAST 5 DAYS. Performed at [facility], [place] [address], [place], [place] [address]    Report Status 12/20/2020 FINAL  Final  Culture, blood (routine x 2)     Status: None (Preliminary result)   Collection Time: 12/17/20  8:03 AM   Specimen: BLOOD RIGHT WRIST  Result Value Ref Range Status   Specimen Description BLOOD RIGHT WRIST  Final   Special Requests   Final    BOTTLES DRAWN AEROBIC AND ANAEROBIC Blood Culture adequate volume   Culture   Final    NO GROWTH 4 DAYS Performed at [facility], [place] [address] [place], [place] [address]    Report Status PENDING  Incomplete  Culture, blood (routine x 2)     Status: None  (Preliminary result)   Collection Time: 12/19/20  7:46 AM   Specimen: BLOOD  Result Value Ref Range Status   Specimen Description BLOOD RIGHT ANTECUBITAL  Final   Special Requests   Final    BOTTLES DRAWN AEROBIC AND ANAEROBIC Blood Culture adequate volume   Culture   Final    NO GROWTH 2 DAYS Performed at [facility], [place] [address] [place], [place] [address]    Report Status PENDING  Incomplete  Culture, blood (routine x 2)     Status: None (Preliminary result)   Collection Time: 12/19/20  7:47 AM   Specimen: BLOOD  Result Value Ref Range Status   Specimen Description BLOOD LEFT ANTECUBITAL  Final   Special Requests   Final    BOTTLES DRAWN AEROBIC AND ANAEROBIC Blood Culture adequate volume   Culture   Final    NO GROWTH 2 DAYS Performed at [facility], [place] [address] [place], [place] [address]    Report Status PENDING  Incomplete    RADIOLOGY STUDIES/RESULTS: No results found.   LOS: 6 days   Oren Binet, MD  [facility]    To contact the attending provider between 7A-7P or the covering provider during after hours 7P-7A, please log into the web site [email or web address] and access using universal [place] password for that web site. If you do not have the password, please call the hospital operator.  12/21/2020, 11:08 AM

## 2020-12-21 NOTE — Progress Notes (Addendum)
STROKE TEAM PROGRESS NOTE   SUBJECTIVE (INTERVAL HISTORY) His wife and sister in law are at the bedside. Pt no significant change of mental status, on vimpat now and today restarted amantadine. However, his last blood culture still showed 1/2 bottle positive. ID on board, concerning persistent positive BCx and poor prognosis.    OBJECTIVE Temp:  [98 F (36.7 C)-99.6 F (37.6 C)] 98.4 F (36.9 C) (10/17 2000) Pulse Rate:  [78-87] 84 (10/17 2000) Cardiac Rhythm: Normal sinus rhythm;Heart block (10/17 1900) Resp:  [20-26] 22 (10/17 2000) BP: (98-127)/(66-75) 107/72 (10/17 2000) SpO2:  [92 %-98 %] 96 % (10/17 2000) Weight:  [83.9 kg] 83.9 kg (10/17 0456)  Recent Labs  Lab 12/21/20 0359 12/21/20 0739 12/21/20 1210 12/21/20 1631 12/21/20 2038  GLUCAP 191* 190* 193* 172* 163*   Recent Labs  Lab 12/17/20 0210 12/18/20 0221 12/18/20 1210 12/18/20 1630 12/19/20 0610 12/19/20 1632 12/20/20 0153 12/21/20 0151  NA 144 143  --   --  139  --  137 135  K 3.9 3.7  --   --  3.5  --  4.2 4.0  CL 117* 114*  --   --  108  --  109 104  CO2 22 24  --   --  23  --  23 24  GLUCOSE 208* 152*  --   --  186*  --  190* 171*  BUN 23 18  --   --  16  --  15 15  CREATININE 0.79 0.78  --   --  0.76  --  0.69 0.65  CALCIUM 7.7* 7.6*  --   --  7.7*  --  7.5* 7.5*  MG 2.1 2.0 1.9 1.9 2.0 1.9 1.9  --   PHOS  --   --  2.8 3.1 2.7 2.4*  --   --    Recent Labs  Lab 12/15/20 0508 12/16/20 0130 12/19/20 0610 12/20/20 0153 12/21/20 0151  AST 48* 56* 64* 60* 72*  ALT 75* 76* 77* 72* 86*  ALKPHOS 65 66 68 61 69  BILITOT 0.7 0.5 0.4 0.5 0.3  PROT 5.8* 5.8* 5.7* 5.3* 5.5*  ALBUMIN 2.0* 1.9* 1.7* 1.6* 1.5*   Recent Labs  Lab 12/16/20 0130 12/18/20 0221 12/19/20 0610 12/20/20 0153 12/21/20 0151  WBC 10.7* 9.1 10.1 9.1 9.9  NEUTROABS 7.7  --   --   --   --   HGB 9.9* 8.8* 8.8* 8.7* 8.8*  HCT 30.7* 27.3* 27.6* 26.8* 26.6*  MCV 88.0 88.1 87.9 87.9 86.9  PLT 106* 132* 176 191 192   No results  for input(s): CKTOTAL, CKMB, CKMBINDEX, TROPONINI in the last 168 hours. No results for input(s): LABPROT, INR in the last 72 hours. No results for input(s): COLORURINE, LABSPEC, [place], GLUCOSEU, HGBUR, BILIRUBINUR, KETONESUR, PROTEINUR, UROBILINOGEN, NITRITE, LEUKOCYTESUR in the last 72 hours.  Invalid input(s): APPERANCEUR     Component Value Date/Time   CHOL 119 08/08/2020 0625   CHOL 102 11/26/2013 0000   TRIG 105 08/08/2020 0625   TRIG 93 11/26/2013 0000   HDL 42 08/08/2020 0625   CHOLHDL 2.8 08/08/2020 0625   VLDL 21 08/08/2020 0625   LDLCALC 56 08/08/2020 0625   LDLCALC 58 11/26/2013 0000   Lab Results  Component Value Date   HGBA1C 7.3 (H) 12/17/2020      Component Value Date/Time   LABOPIA NONE DETECTED 08/05/2013 2207   COCAINSCRNUR NONE DETECTED 08/05/2013 2207   LABBENZ NONE DETECTED 08/05/2013 2207   AMPHETMU NONE  DETECTED 08/05/2013 2207   THCU NONE DETECTED 08/05/2013 2207   LABBARB NONE DETECTED 08/05/2013 2207    No results for input(s): ETH in the last 168 hours.  I have personally reviewed the radiological images below and agree with the radiology interpretations.  CT Head Wo Contrast  Result Date: 12/14/2020 CLINICAL DATA:  Mental status change, unknown cause EXAM: CT HEAD WITHOUT CONTRAST TECHNIQUE: Contiguous axial images were obtained from the base of the skull through the vertex without intravenous contrast. 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 Old left parietal infarct. Findings are stable since prior study. Small right subdural hematoma again noted overlying the right frontoparietal lobe, 3 mm compared to 5 mm previously. There is atrophy and chronic small vessel disease changes. Ventriculomegaly likely rib related to ex vacuo dilatation. No acute infarct or acute hemorrhage. Vascular: No hyperdense vessel or unexpected calcification. Skull: Prior right temporoparietal craniotomy. No acute calvarial  abnormality. Sinuses/Orbits: No acute findings Other: None IMPRESSION: Small right subdural hematoma, 3 mm compared with 5 mm previously. No new hemorrhage. Old right MCA and left parietal infarcts, stable. Ventriculomegaly related to ex vacuo dilatation. Atrophy, chronic microvascular disease. No acute intracranial abnormality. Electronically Signed   By: Rolm Baptise M.D.   On: 12/14/2020 13:50   MR BRAIN WO CONTRAST  Result Date: 12/18/2020 CLINICAL DATA:  Mental status change, unknown cause EXAM: MRI HEAD WITHOUT CONTRAST TECHNIQUE: Multiplanar, multiecho pulse sequences of the brain and surrounding structures were obtained without intravenous contrast. COMPARISON:  08/29/2020 FINDINGS: Motion artifact is present. Brain: Small focus of reduced diffusion is present within the left cerebellar vermis. Diffusion hyperintensity in the region of prior right MCA infarct is probably artifactual. There is a 1.2 cm subacute hematoma of the anterior right superior frontal gyrus (series 6, image 18). Chronic right MCA territory infarction with chronic blood products. Ex vacuo dilatation of the adjacent right lateral ventricle. Chronic infarct on the left near junction of parietal, temporal, and occipital lobes also with chronic blood products. Additional confluent T2 hyperintensity in the supratentorial white matter is nonspecific but may reflect chronic microvascular ischemic changes. Disproportionate prominence of the lateral and third ventricles is unchanged. Thin extra-axial collection underlies craniotomy. Vascular: Major vessel flow voids at the skull base are preserved. Skull and upper cervical spine: Normal marrow signal is preserved. Prior right craniotomy. Sinuses/Orbits: Minor mucosal thickening. No acute orbital abnormality. Other: Sella is unremarkable.  Mastoid air cells are clear. IMPRESSION: Small acute infarct left cerebellar vermis. Small subacute hematoma anterior right frontal lobe (isodense on CT).  Stable chronic findings of infarcts, volume loss, and chronic microvascular ischemic changes. Electronically Signed   By: Macy Mis M.D.   On: 12/18/2020 12:49   DG Chest Port 1 View  Result Date: 12/14/2020 CLINICAL DATA:  Altered mental status EXAM: PORTABLE CHEST 1 VIEW COMPARISON:  Chest radiograph 08/07/2020 FINDINGS: Median sternotomy wires and mitral and aortic valve prostheses are stable. The cardiomediastinal silhouette is stable. There is calcified atherosclerotic plaque of the aortic arch. Lung volumes are low. There is no focal consolidation or pulmonary edema. There is no pleural effusion or pneumothorax. There is no acute osseous abnormality. IMPRESSION: Low lung volumes. Otherwise, no radiographic evidence of acute cardiopulmonary process. Electronically Signed   By: Valetta Mole M.D.   On: 12/14/2020 13:01   DG Abd Portable 1V  Result Date: 12/18/2020 CLINICAL DATA:  Feeding tube placement EXAM: PORTABLE ABDOMEN - 1 VIEW COMPARISON:  09/01/2020 FINDINGS: Feeding tube terminates  at the distal stomach. Cardiomegaly with median sternotomy and left base airspace disease. Non-obstructive bowel gas pattern. No gross free intraperitoneal air. IMPRESSION: Feeding tube terminating at the distal stomach. Electronically Signed   By: Abigail Miyamoto M.D.   On: 12/18/2020 16:44   EEG adult  Result Date: 12/17/2020 Lora Havens, MD     12/17/2020  1:00 PM Patient Name: [name] [name] MRN: [id number] Epilepsy Attending: Lora Havens Referring Physician/Provider: Dr Oren Binet Date: 12/17/2020 Duration: 24.04 mins Patient history: 80 year old male with recent subdural hematoma in May, seizures who presented with altered mental status.  EEG to evaluate for seizures. Level of alertness: Awake AEDs during EEG study: LEV Technical aspects: This EEG study was done with scalp electrodes positioned according to the 10-20 International system of electrode placement. Electrical activity was  acquired at a sampling rate of 500Hz  and reviewed with a high frequency filter of 70Hz  and a low frequency filter of 1Hz . EEG data were recorded continuously and digitally stored. Description: The posterior dominant rhythm consists of 8-9 Hz activity of moderate voltage (25-35 uV) seen predominantly in posterior head regions, symmetric and reactive to eye opening and eye closing. EEG showed continuous low amplitude 2 to 3 Hz delta slowing in right frontotemporal region. Intermittent generalized 3 to 5 Hz theta-delta slowing was also noted. Hyperventilation and photic stimulation were not performed.   ABNORMALITY - Continuous slow, right frontotemporal region - Intermittent slow, generalized IMPRESSION: This study is suggestive of cortical dysfunction in right frontotemporal region likely secondary to underlying structural abnormality/stroke/subdural hemorrhage.  Additionally there is mild diffuse encephalopathy, nonspecific etiology.  No seizures or epileptiform discharges were seen throughout the recording.  If suspicion for ictal- interictal activity remains a concern, a prolonged study can be considered. Priyanka Barbra Sarks   Overnight EEG with video  Result Date: 12/18/2020 Lora Havens, MD     12/19/2020  7:36 AM Patient Name: [name] [name] MRN: [id number] Epilepsy Attending: Lora Havens Referring Physician/Provider: Dr Oren Binet Duration: 12/17/2020 1638 to 12/18/2020 1002  Patient history: 80 year old male with recent subdural hematoma in May, seizures who presented with altered mental status.  EEG to evaluate for seizures.  Level of alertness: Awake, asleep  AEDs during EEG study: LEV  Technical aspects: This EEG study was done with scalp electrodes positioned according to the 10-20 International system of electrode placement. Electrical activity was acquired at a sampling rate of 500Hz  and reviewed with a high frequency filter of 70Hz  and a low frequency filter of 1Hz . EEG data were  recorded continuously and digitally stored.  Description: The posterior dominant rhythm consists of 8-9 Hz activity of moderate voltage (25-35 uV) seen predominantly in posterior head regions, symmetric and reactive to eye opening and eye closing.  Sleep was characterized by vertex waves, sleep spindles (12 to 14 Hz), maximal frontocentral region.  EEG showed continuous low amplitude 2 to 3 Hz delta slowing in right frontotemporal region. Hyperventilation and photic stimulation were not performed.    ABNORMALITY - Continuous slow, right frontotemporal region  IMPRESSION: This study is suggestive of cortical dysfunction in right frontotemporal region likely secondary to underlying structural abnormality/stroke/subdural hemorrhage. No seizures or epileptiform discharges were seen throughout the recording. Lora Havens   ECHOCARDIOGRAM COMPLETE  Result Date: 12/15/2020    ECHOCARDIOGRAM REPORT   Patient Name:   [name] [name] Date of Exam: 12/15/2020 Medical Rec #:  [id number]     Height:       67.0  in Accession #:    [id number]    Weight:       166.2 lb Date of Birth:  [birth year]     BSA:          1.869 m Patient Age:    26 years      BP:           120/67 mmHg Patient Gender: M             HR:           98 bpm. Exam Location:  Inpatient Procedure: 2D Echo, Cardiac Doppler, Color Doppler and Intracardiac            Opacification Agent Indications:    Fever R50.9  History:        Patient has prior history of Echocardiogram examinations, most                 recent 08/07/2020. Stroke; Arrythmias:Atrial Fibrillation. Past                 history of rheumatic fever, acute rheumatic pericarditis.                 Thoracic aortic aneurysm.                 Aortic Valve: 27 mm Carpentier-Edwards prosthetic valve is                 present in the aortic position. Procedure Date: 10/18/2004.  Sonographer:    Darlina Sicilian RDCS Sonographer#2:  Darlina Sicilian RDCS Referring Phys: [id number]  1.  The mitral valve has been replaced with an unknown bioprosthesis. No evidence of mitral valve regurgitation. The mean mitral valve gradient is 10.0 mmHg with average heart rate of 101 bpm. PHT 70 ms. Suboptimal LVOT spectral Doppler- VTI deferred. Date of Procedure Date: 10/18/2004.  2. The aortic valve has been repaired/replaced. Aortic valve regurgitation is not visualized. 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 Procedure Date: 10/18/2004. Aortic valve mean gradient measures 16.0 mmHg.  Aortic valve acceleration time measures 74 msec.  3. Left ventricular ejection fraction, by estimation, is 65 to 70%. The left ventricle has normal function. The left ventricle has no regional wall motion abnormalities. There is mild concentric left ventricular hypertrophy. Left ventricular diastolic parameters are indeterminate.  4. Aortic dilatation noted. 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  5. Right ventricular systolic function is normal. The right ventricular size is normal. Comparison(s): A prior study was performed on 08/07/2020. Prior images reviewed side by side. Stable aortic root and ascending aorta size. Increased mitral valve gradients, but at higher hear rates from prior. FINDINGS  Left Ventricle: Left ventricular ejection fraction, by estimation, is 65 to 70%. The left ventricle has normal function. The left ventricle has no regional wall motion abnormalities. Definity contrast agent was given IV to delineate the left ventricular  endocardial borders. The left ventricular internal cavity size was normal in size. There is mild concentric left ventricular hypertrophy. Left ventricular diastolic parameters are indeterminate. Right Ventricle: The right ventricular size is normal. No increase in right ventricular wall thickness. Right ventricular systolic function is normal. Left  Atrium: Left atrial size was normal in size. Right Atrium: Right atrial size was normal in size. Pericardium: There is no evidence of pericardial effusion. Mitral Valve: The mitral valve has  been repaired/replaced. No evidence of mitral valve regurgitation. There is a 29 mm Carpentier-Edwards prosthetic present in the mitral position. MV peak gradient, 17.1 mmHg. The mean mitral valve gradient is 10.0 mmHg with average heart rate of 101 bpm. Tricuspid Valve: The tricuspid valve is grossly normal. Tricuspid valve regurgitation is mild . No evidence of tricuspid stenosis. Aortic Valve: The aortic valve has been repaired/replaced. Aortic valve regurgitation is not visualized. Aortic valve mean gradient measures 16.0 mmHg. Aortic valve peak gradient measures 24.4 mmHg. Aortic valve area, by VTI measures 1.56 cm. There is a  27 mm Carpentier-Edwards prosthetic valve present in the aortic position. Procedure Date: 10/18/2004. Pulmonic Valve: The pulmonic valve was normal in structure. Pulmonic valve regurgitation is mild. No evidence of pulmonic stenosis. Aorta: The ascending aorta was not well visualized and aortic dilatation noted. 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 IAS/Shunts: The atrial septum is grossly normal.  LEFT VENTRICLE PLAX 2D LVIDd:         4.80 cm LVIDs:         3.50 cm LV PW:         1.30 cm LV IVS:        1.30 cm LVOT diam:     3.25 cm LV SV:         62 LV SV Index:   33 LVOT Area:     8.30 cm  LV Volumes (MOD) LV vol d, MOD A2C: 81.2 ml LV vol d, MOD A4C: 94.4 ml LV vol s, MOD A2C: 18.0 ml LV vol s, MOD A4C: 30.1 ml LV SV MOD A2C:     63.2 ml LV SV MOD A4C:     94.4 ml LV SV MOD BP:      63.9 ml RIGHT VENTRICLE RV S prime:     10.40 cm/s LEFT ATRIUM             Index        RIGHT ATRIUM          Index LA diam:        4.50 cm 2.41 cm/m   RA Area:     8.74 cm LA Vol (A2C):   58.1 ml 31.08 ml/m  RA  Volume:   12.60 ml 6.74 ml/m LA Vol (A4C):   56.9 ml 30.44 ml/m LA Biplane Vol: 58.9 ml 31.51 ml/m  AORTIC VALVE AV Area (Vmax):    5.29 cm AV Area (Vmean):   1.55 cm AV Area (VTI):     1.56 cm AV Vmax:           247.00 cm/s AV Vmean:          190.500 cm/s AV VTI:            0.401 m AV Peak Grad:      24.4 mmHg AV Mean Grad:      16.0 mmHg LVOT Vmax:         157.40 cm/s LVOT Vmean:        35.700 cm/s LVOT VTI:          0.075 m LVOT/AV VTI ratio: 0.19  AORTA Ao Asc diam: 4.90 cm MITRAL VALVE                TRICUSPID VALVE MV Area (PHT): 3.16 cm     TR Peak grad:   26.4 mmHg MV Area VTI:   1.39 cm  TR Vmax:        257.00 cm/s MV Peak grad:  17.1 mmHg MV Mean grad:  10.0 mmHg    SHUNTS MV Vmax:       2.07 m/s     Systemic VTI:  0.08 m MV Vmean:      151.0 cm/s   Systemic Diam: 3.25 cm MV Decel Time: 240 msec MV E velocity: 195.00 cm/s Rudean Haskell MD Electronically signed by Rudean Haskell MD Signature Date/Time: 12/15/2020/12:49:20 PM    Final       PHYSICAL EXAM  Temp:  [98 F (36.7 C)-99.6 F (37.6 C)] 98.4 F (36.9 C) (10/17 2000) Pulse Rate:  [78-87] 84 (10/17 2000) Resp:  [20-26] 22 (10/17 2000) BP: (98-127)/(66-75) 107/72 (10/17 2000) SpO2:  [92 %-98 %] 96 % (10/17 2000) Weight:  [83.9 kg] 83.9 kg (10/17 0456)  General - Well nourished, well developed, in no apparent distress.  Ophthalmologic - fundi not visualized due to noncooperation.  Cardiovascular - Regular rhythm and rate, not in afib.  Neuro - eyes closed, with repetitive stimulation, he was able to open eyes and able to maintain eyes open for several minutes.  However, nonverbal, not follow commands.  Able to blinking spontaneous, however not blinking to visual threat bilaterally.  No tracking.  Not interactive.  Mild left facial droop.  Tongue protrusion not corporative.  Left upper and lower extremity hemiplegia except left lower extremity slight withdraw with pain stimulation.  Right upper extremity  localizing to pain, purposeful barely against gravity.  Right lower extremity no spontaneous movement, however withdrawal to pain 2/5. Sensation, coordination and gait not tested.   ASSESSMENT/PLAN [name] [name] is a 80 y.o. 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 fib status post maze procedure and LAA clipping, stroke admitted for lethargy, weight loss, bacteremia and altered mental status. No tPA given due to outside window.    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  Ongoing aggressive stroke risk factor management Therapy recommendations: Pending Disposition: Pending  Bacteremia Endocarditis UTI TEE showed small mitral wall vegetation Blood culture positive for Enterococcus->repeat Cx 10/13 1/2 positive->repeat Cx 10/15 again 1/2 positive Urine culture positive for staph aureus Currently on ampicillin and Rocephin ID on board, concerning persistent positive BCx and poor prognosis.   PAF Status post maze and left AA clipping Was on Coumadin before Now on Eliquis and Tikosyn Cardiology on board  History of stroke, SDH and seizure Stroke in 08/2013 07/27/2020 admitted for traumatic  right large SDH status post bur hole evacuation.  Coumadin was on hold due to SDH 08/07/2020 admitted for altered mental status with left-sided weakness, CT showed right MCA large infarct.  CTA head and neck right ICA and right M2 occlusion status post IR with TICI2c revascularization.  EF 60 to 65%, LDL 56, A1c  6.2, patient discharged to [facility].  Started Eliquis on 08/25/2020 in [facility]. 08/28/2020 had a seizure activity, MRI no significant change, put on Keppra. Current admission, EEG and long-term EEG no seizure.  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 Sloan Leiter. I had long discussion with wife at bedside, updated pt current condition, treatment plan and potential prognosis, and answered all the questions. She expressed understanding and appreciation.    Rosalin Hawking, MD PhD Stroke Neurology 12/21/2020 11:03 PM    To contact Stroke Continuity provider, please refer to [email or web address] After hours, contact General Neurology

## 2020-12-21 NOTE — Progress Notes (Signed)
   12/21/20 0810  Provider Notification  Provider Name/Title Dr. Sloan Leiter  Date Provider Notified 12/21/20  Time Provider Notified [phone]  Notification Type Page  Notification Reason Other (Comment) (QTc >500. hold medication and notify MD)  Provider response See new orders (Confirm with 12 lead EKG then hold medication if >500.)  Date of Provider Response 12/21/20  Time of Provider Response [phone]

## 2020-12-21 NOTE — Progress Notes (Addendum)
INFECTIOUS DISEASE PROGRESS NOTE  ID: [name] [name] is a 80 y.o. male with  Active Problems:   Pressure injury of skin   Hypernatremia   Bacteremia   S/P MVR (mitral valve repair)   History of CVA in adulthood   Protein-calorie malnutrition, severe  Subjective: No response  Abtx:  Anti-infectives (From admission, onward)    Start     Dose/Rate Route Frequency Ordered Stop   12/18/20 1500  cefTRIAXone (ROCEPHIN) 2 g in sodium chloride 0.9 % 100 mL IVPB        2 g 200 mL/hr over 30 Minutes Intravenous Every 12 hours 12/18/20 1344     12/15/20 1700  cefTRIAXone (ROCEPHIN) 1 g in sodium chloride 0.9 % 100 mL IVPB  Status:  Discontinued        1 g 200 mL/hr over 30 Minutes Intravenous Every 24 hours 12/14/20 1648 12/15/20 0624   12/15/20 0630  ampicillin (OMNIPEN) 2 g in sodium chloride 0.9 % 100 mL IVPB        2 g 300 mL/hr over 20 Minutes Intravenous Every 6 hours 12/15/20 0624     12/15/20 0630  gentamicin (GARAMYCIN) IVPB 80 mg        80 mg 100 mL/hr over 30 Minutes Intravenous  Once 12/15/20 0628 12/15/20 0732   12/14/20 1645  cefTRIAXone (ROCEPHIN) 1 g in sodium chloride 0.9 % 100 mL IVPB        1 g 200 mL/hr over 30 Minutes Intravenous  Once 12/14/20 1642 12/14/20 1830       Medications: Scheduled:  amantadine  200 mg Per Tube BID   apixaban  5 mg Per Tube BID   atorvastatin  80 mg Per Tube Daily   dofetilide  500 mcg Oral BID   feeding supplement (PROSource TF)  45 mL Per Tube Daily   finasteride  5 mg Oral Daily   free water  125 mL Per Tube Q4H   insulin aspart  0-9 Units Subcutaneous TID WC   lacosamide  50 mg Per Tube BID   magnesium gluconate  500 mg Per Tube Daily   mouth rinse  15 mL Mouth Rinse BID   multivitamin with minerals  1 tablet Per Tube Daily   sodium chloride flush  3 mL Intravenous Q12H   tamsulosin  0.4 mg Oral Daily    Objective: Vital signs in last 24 hours: Temp:  [98.5 F (36.9 C)-99.6 F (37.6 C)] 98.9 F (37.2 C) (10/17  0733) Pulse Rate:  [74-83] 83 (10/17 1000) Resp:  [18-26] 24 (10/17 1005) BP: (102-127)/(66-75) 103/71 (10/17 1000) SpO2:  [96 %-100 %] 97 % (10/17 0733) Weight:  [83.9 kg] 83.9 kg (10/17 0456)   General appearance: no distress Resp: clear to auscultation bilaterally Cardio: regular rate and rhythm GI: normal findings: bowel sounds normal and soft, non-tender  Lab Results Recent Labs    12/20/20 0153 12/21/20 0151  WBC 9.1 9.9  HGB 8.7* 8.8*  HCT 26.8* 26.6*  NA 137 135  K 4.2 4.0  CL 109 104  CO2 23 24  BUN 15 15  CREATININE 0.69 0.65   Liver Panel Recent Labs    12/20/20 0153 12/21/20 0151  PROT 5.3* 5.5*  ALBUMIN 1.6* 1.5*  AST 60* 72*  ALT 72* 86*  ALKPHOS 61 69  BILITOT 0.5 0.3   Sedimentation Rate No results for input(s): ESRSEDRATE in the last 72 hours. C-Reactive Protein No results for input(s): CRP in the last 72  hours.  Microbiology: Recent Results (from the past 240 hour(s))  Culture, blood (Routine x 2)     Status: Abnormal   Collection Time: 12/14/20 12:45 PM   Specimen: BLOOD RIGHT FOREARM  Result Value Ref Range Status   Specimen Description BLOOD RIGHT FOREARM  Final   Special Requests   Final    BOTTLES DRAWN AEROBIC AND ANAEROBIC Blood Culture adequate volume   Culture  Setup Time   Final    GRAM POSITIVE COCCI IN BOTH AEROBIC AND ANAEROBIC BOTTLES CRITICAL RESULT CALLED TO, READ BACK BY AND VERIFIED WITH: PHARMD JAMES LEDFORD 12/15/2020@6 :07 BY TW Performed at [facility], [place] [address], [place], [place] [address]    Culture ENTEROCOCCUS FAECALIS (A)  Final   Report Status 12/17/2020 FINAL  Final   Organism ID, Bacteria ENTEROCOCCUS FAECALIS  Final      Susceptibility   Enterococcus faecalis - MIC*    AMPICILLIN <=2 SENSITIVE Sensitive     VANCOMYCIN 2 SENSITIVE Sensitive     GENTAMICIN SYNERGY SENSITIVE Sensitive     * ENTEROCOCCUS FAECALIS  Blood Culture ID Panel (Reflexed)     Status: Abnormal   Collection Time:  12/14/20 12:45 PM  Result Value Ref Range Status   Enterococcus faecalis DETECTED (A) NOT DETECTED Final    Comment: CRITICAL RESULT CALLED TO, READ BACK BY AND VERIFIED WITH: PHARMD JAMES LEDFORD 12/15/2020@6 :07 BY TW    Enterococcus Faecium NOT DETECTED NOT DETECTED Final   Listeria monocytogenes NOT DETECTED NOT DETECTED Final   Staphylococcus species NOT DETECTED NOT DETECTED Final   Staphylococcus aureus (BCID) NOT DETECTED NOT DETECTED Final   Staphylococcus epidermidis NOT DETECTED NOT DETECTED Final   Staphylococcus lugdunensis NOT DETECTED NOT DETECTED Final   Streptococcus species NOT DETECTED NOT DETECTED Final   Streptococcus agalactiae NOT DETECTED NOT DETECTED Final   Streptococcus pneumoniae NOT DETECTED NOT DETECTED Final   Streptococcus pyogenes NOT DETECTED NOT DETECTED Final   A.calcoaceticus-baumannii NOT DETECTED NOT DETECTED Final   Bacteroides fragilis NOT DETECTED NOT DETECTED Final   Enterobacterales NOT DETECTED NOT DETECTED Final   Enterobacter cloacae complex NOT DETECTED NOT DETECTED Final   Escherichia coli NOT DETECTED NOT DETECTED Final   Klebsiella aerogenes NOT DETECTED NOT DETECTED Final   Klebsiella oxytoca NOT DETECTED NOT DETECTED Final   Klebsiella pneumoniae NOT DETECTED NOT DETECTED Final   Proteus species NOT DETECTED NOT DETECTED Final   Salmonella species NOT DETECTED NOT DETECTED Final   Serratia marcescens NOT DETECTED NOT DETECTED Final   Haemophilus influenzae NOT DETECTED NOT DETECTED Final   Neisseria meningitidis NOT DETECTED NOT DETECTED Final   Pseudomonas aeruginosa NOT DETECTED NOT DETECTED Final   Stenotrophomonas maltophilia NOT DETECTED NOT DETECTED Final   Candida albicans NOT DETECTED NOT DETECTED Final   Candida auris NOT DETECTED NOT DETECTED Final   Candida glabrata NOT DETECTED NOT DETECTED Final   Candida krusei NOT DETECTED NOT DETECTED Final   Candida parapsilosis NOT DETECTED NOT DETECTED Final   Candida  tropicalis NOT DETECTED NOT DETECTED Final   Cryptococcus neoformans/gattii NOT DETECTED NOT DETECTED Final   Vancomycin resistance NOT DETECTED NOT DETECTED Final    Comment: Performed at [facility], [place] [address], [place], [place] [address]  Resp Panel by RT-PCR (Flu A&B, Covid) Nasopharyngeal Swab     Status: None   Collection Time: 12/14/20  3:41 PM   Specimen: Nasopharyngeal Swab; Nasopharyngeal(NP) swabs in vial transport medium  Result Value Ref Range Status  SARS Coronavirus 2 by RT PCR NEGATIVE NEGATIVE Final    Comment: (NOTE) SARS-CoV-2 target nucleic acids are NOT DETECTED.  The SARS-CoV-2 RNA is generally detectable in upper respiratory specimens during the acute phase of infection. The lowest concentration of SARS-CoV-2 viral copies this assay can detect is 138 copies/mL. A negative result does not preclude SARS-Cov-2 infection and should not be used as the sole basis for treatment or other patient management decisions. A negative result may occur with  improper specimen collection/handling, submission of specimen other than nasopharyngeal swab, presence of viral mutation(s) within the areas targeted by this assay, and inadequate number of viral copies(<138 copies/mL). A negative result must be combined with clinical observations, patient history, and epidemiological information. The expected result is Negative.  Fact Sheet for Patients:  EntrepreneurPulse.com.au  Fact Sheet for Healthcare Providers:  IncredibleEmployment.be  This test is no t yet approved or cleared by the [place] FDA and  has been authorized for detection and/or diagnosis of SARS-CoV-2 by FDA under an Emergency Use Authorization (EUA). This EUA will remain  in effect (meaning this test can be used) for the duration of the COVID-19 declaration under Section 564(b)(1) of the Act, 21 U.S.C.section 360bbb-3(b)(1), unless the authorization is terminated   or revoked sooner.       Influenza A by PCR NEGATIVE NEGATIVE Final   Influenza B by PCR NEGATIVE NEGATIVE Final    Comment: (NOTE) The Xpert Xpress SARS-CoV-2/FLU/RSV plus assay is intended as an aid in the diagnosis of influenza from Nasopharyngeal swab specimens and should not be used as a sole basis for treatment. Nasal washings and aspirates are unacceptable for Xpert Xpress SARS-CoV-2/FLU/RSV testing.  Fact Sheet for Patients: EntrepreneurPulse.com.au  Fact Sheet for Healthcare Providers: IncredibleEmployment.be  This test is not yet approved or cleared by the [place] FDA and has been authorized for detection and/or diagnosis of SARS-CoV-2 by FDA under an Emergency Use Authorization (EUA). This EUA will remain in effect (meaning this test can be used) for the duration of the COVID-19 declaration under Section 564(b)(1) of the Act, 21 U.S.C. section 360bbb-3(b)(1), unless the authorization is terminated or revoked.  Performed at [facility], [place] [address], [place], [place] [address]   Culture, blood (Routine x 2)     Status: Abnormal   Collection Time: 12/14/20  4:39 PM   Specimen: BLOOD  Result Value Ref Range Status   Specimen Description BLOOD RIGHT HAND  Final   Special Requests   Final    BOTTLES DRAWN AEROBIC AND ANAEROBIC Blood Culture results may not be optimal due to an inadequate volume of blood received in culture bottles   Culture  Setup Time   Final    GRAM POSITIVE COCCI IN CHAINS IN BOTH AEROBIC AND ANAEROBIC BOTTLES CRITICAL VALUE NOTED.  VALUE IS CONSISTENT WITH PREVIOUSLY REPORTED AND CALLED VALUE.    Culture (A)  Final    ENTEROCOCCUS FAECALIS SUSCEPTIBILITIES PERFORMED ON PREVIOUS CULTURE WITHIN THE LAST 5 DAYS. Performed at [facility], [place] [address] [place], [place] [address]    Report Status 12/17/2020 FINAL  Final  Urine Culture     Status: Abnormal   Collection Time: 12/14/20   4:43 PM   Specimen: Urine, Clean Catch  Result Value Ref Range Status   Specimen Description URINE, CLEAN CATCH  Final   Special Requests   Final    NONE Performed at [facility], [place] [address] [place], [place] [address]  Culture >=100,000 COLONIES/mL STAPHYLOCOCCUS AUREUS (A)  Final   Report Status 12/17/2020 FINAL  Final   Organism ID, Bacteria STAPHYLOCOCCUS AUREUS (A)  Final      Susceptibility   Staphylococcus aureus - MIC*    CIPROFLOXACIN <=0.5 SENSITIVE Sensitive     GENTAMICIN <=0.5 SENSITIVE Sensitive     NITROFURANTOIN 32 SENSITIVE Sensitive     OXACILLIN <=0.25 SENSITIVE Sensitive     TETRACYCLINE <=1 SENSITIVE Sensitive     VANCOMYCIN <=0.5 SENSITIVE Sensitive     TRIMETH/SULFA <=10 SENSITIVE Sensitive     CLINDAMYCIN <=0.25 SENSITIVE Sensitive     RIFAMPIN <=0.5 SENSITIVE Sensitive     Inducible Clindamycin NEGATIVE Sensitive     * >=100,000 COLONIES/mL STAPHYLOCOCCUS AUREUS  Culture, blood (routine x 2)     Status: Abnormal   Collection Time: 12/15/20 11:20 AM   Specimen: BLOOD RIGHT ARM  Result Value Ref Range Status   Specimen Description BLOOD RIGHT ARM  Final   Special Requests   Final    BOTTLES DRAWN AEROBIC AND ANAEROBIC Blood Culture adequate volume   Culture  Setup Time   Final    GRAM POSITIVE COCCI ANAEROBIC BOTTLE ONLY CRITICAL VALUE NOTED.  VALUE IS CONSISTENT WITH PREVIOUSLY REPORTED AND CALLED VALUE.    Culture (A)  Final    ENTEROCOCCUS FAECALIS SUSCEPTIBILITIES PERFORMED ON PREVIOUS CULTURE WITHIN THE LAST 5 DAYS. Performed at [facility], [place] [address] [place], [place] [address]    Report Status 12/19/2020 FINAL  Final  Culture, blood (routine x 2)     Status: None   Collection Time: 12/15/20 11:25 AM   Specimen: BLOOD LEFT ARM  Result Value Ref Range Status   Specimen Description BLOOD LEFT ARM  Final   Special Requests   Final    BOTTLES DRAWN AEROBIC ONLY Blood Culture results may not be optimal due to an  inadequate volume of blood received in culture bottles   Culture   Final    NO GROWTH 5 DAYS Performed at [facility], [place] [address], [place], [place] [address]    Report Status 12/20/2020 FINAL  Final  MRSA Next Gen by PCR, Nasal     Status: None   Collection Time: 12/16/20  4:34 AM   Specimen: Nasal Mucosa; Nasal Swab  Result Value Ref Range Status   MRSA by PCR Next Gen NOT DETECTED NOT DETECTED Final    Comment: (NOTE) The GeneXpert MRSA Assay (FDA approved for NASAL specimens only), is one component of a comprehensive MRSA colonization surveillance program. It is not intended to diagnose MRSA infection nor to guide or monitor treatment for MRSA infections. Test performance is not FDA approved in patients less than 68 years old. Performed at [facility], [place] [address] [place],  [address]   Culture, blood (routine x 2)     Status: Abnormal   Collection Time: 12/17/20  8:01 AM   Specimen: BLOOD RIGHT HAND  Result Value Ref Range Status   Specimen Description BLOOD RIGHT HAND  Final   Special Requests   Final    BOTTLES DRAWN AEROBIC AND ANAEROBIC Blood Culture adequate volume   Culture  Setup Time   Final    GRAM POSITIVE COCCI IN CHAINS ANAEROBIC BOTTLE ONLY CRITICAL VALUE NOTED.  VALUE IS CONSISTENT WITH PREVIOUSLY REPORTED AND CALLED VALUE.    Culture (A)  Final    ENTEROCOCCUS FAECALIS SUSCEPTIBILITIES PERFORMED ON PREVIOUS CULTURE WITHIN THE LAST 5 DAYS. Performed at  [facility], [place] [address] [place], [place] [address]    Report Status 12/20/2020 FINAL  Final  Culture, blood (routine x 2)     Status: None (Preliminary result)   Collection Time: 12/17/20  8:03 AM   Specimen: BLOOD RIGHT WRIST  Result Value Ref Range Status   Specimen Description BLOOD RIGHT WRIST  Final   Special Requests   Final    BOTTLES DRAWN AEROBIC AND ANAEROBIC Blood Culture adequate volume   Culture  Setup Time   Final    GRAM POSITIVE COCCI CRITICAL VALUE  NOTED.  VALUE IS CONSISTENT WITH PREVIOUSLY REPORTED AND CALLED VALUE. Performed at [facility], [place] [address] [place], [place] [address]    Culture GRAM POSITIVE COCCI  Final   Report Status PENDING  Incomplete  Culture, blood (routine x 2)     Status: None (Preliminary result)   Collection Time: 12/19/20  7:46 AM   Specimen: BLOOD  Result Value Ref Range Status   Specimen Description BLOOD RIGHT ANTECUBITAL  Final   Special Requests   Final    BOTTLES DRAWN AEROBIC AND ANAEROBIC Blood Culture adequate volume   Culture   Final    NO GROWTH 2 DAYS Performed at [facility], [place] [address] [place], [place] [id number]    Report Status PENDING  Incomplete  Culture, blood (routine x 2)     Status: None (Preliminary result)   Collection Time: 12/19/20  7:47 AM   Specimen: BLOOD  Result Value Ref Range Status   Specimen Description BLOOD LEFT ANTECUBITAL  Final   Special Requests   Final    BOTTLES DRAWN AEROBIC AND ANAEROBIC Blood Culture adequate volume   Culture  Setup Time   Final    GRAM POSITIVE COCCI AEROBIC BOTTLE ONLY CRITICAL VALUE NOTED.  VALUE IS CONSISTENT WITH PREVIOUSLY REPORTED AND CALLED VALUE. Performed at [facility], [place] [address] [place], [place] [address]    Culture [facility] POSITIVE COCCI  Final   Report Status PENDING  Incomplete    Studies/Results: No results found.   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  Agree with goals of care discussion With his persistently positive BCx, his outcomes/options are poor (prolonged, chronic amoxil?) Discussed BCx with family.     Bobby Rumpf MD, FACP Infectious Diseases (pager) [phone] [email or web address] 12/21/2020, 12:08 PM  LOS: 6 days

## 2020-12-21 NOTE — Progress Notes (Signed)
Patient ID: [name] [name], male   DOB: 1940/09/28, 80 y.o.   MRN: [id number]     Advanced Heart Failure Rounding Note  PCP-Cardiologist: Loralie Champagne, MD   Subjective:    Sleeping this morning, per notes he has remained encephalopathic.   Cortrak in place, getting tube feeds.   TEE: EF 60-65%, RV probably normal, bioprosthetic AVR mean gradient 14 with trivial AI, bioprosthetic MV with trivial MR, mean gradient 8, vegetation on the sewing ring and also on the leaflets (relatively small size).     Objective:   Weight Range: 83.9 kg Body mass index is 28.97 kg/m.   Vital Signs:   Temp:  [97.9 F (36.6 C)-99.6 F (37.6 C)] 98.9 F (37.2 C) (10/17 0733) Pulse Rate:  [71-79] 79 (10/17 0733) Resp:  [18-25] 20 (10/17 0733) BP: (101-127)/(64-75) 106/66 (10/17 0733) SpO2:  [96 %-100 %] 97 % (10/17 0733) Weight:  [83.9 kg] 83.9 kg (10/17 0456) Last BM Date: 12/20/20  Weight change: Filed Weights   12/14/20 1229 12/19/20 0343 12/21/20 0456  Weight: 75.4 kg 82.6 kg 83.9 kg    Intake/Output:   Intake/Output Summary (Last 24 hours) at 12/21/2020 0802 Last data filed at 12/21/2020 0400 Gross per 24 hour  Intake 3800.64 ml  Output 1300 ml  Net 2500.64 ml      Physical Exam    General: NAD Neck: No JVD, no thyromegaly or thyroid nodule.  Lungs: Clear to auscultation bilaterally with normal respiratory effort. CV: Nondisplaced PMI.  Heart regular S1/S2, no S3/S4, 2/6 early SEM RUSB.  No peripheral edema.   Abdomen: Soft, nontender, no hepatosplenomegaly, no distention.  Skin: Intact without lesions or rashes.  Neurologic: Sleeping, per notes remains encephalopathic.  Extremities: No clubbing or cyanosis.  HEENT: Normal.    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 Personally reviewed.   Labs    CBC Recent Labs    12/20/20 0153 12/21/20 0151  WBC 9.1 9.9  HGB 8.7* 8.8*  HCT 26.8* 26.6*  MCV 87.9 86.9  PLT 191 272   Basic Metabolic Panel Recent Labs     12/19/20 0610 12/19/20 1632 12/20/20 0153 12/21/20 0151  NA 139  --  137 135  K 3.5  --  4.2 4.0  CL 108  --  109 104  CO2 23  --  23 24  GLUCOSE 186*  --  190* 171*  BUN 16  --  15 15  CREATININE 0.76  --  0.69 0.65  CALCIUM 7.7*  --  7.5* 7.5*  MG 2.0 1.9 1.9  --   PHOS 2.7 2.4*  --   --    Liver Function Tests Recent Labs    12/20/20 0153 12/21/20 0151  AST 60* 72*  ALT 72* 86*  ALKPHOS 61 69  BILITOT 0.5 0.3  PROT 5.3* 5.5*  ALBUMIN 1.6* 1.5*   No results for input(s): LIPASE, AMYLASE in the last 72 hours. Cardiac Enzymes No results for input(s): CKTOTAL, CKMB, CKMBINDEX, TROPONINI in the last 72 hours.  BNP: BNP (last 3 results) No results for input(s): BNP in the last 8760 hours.  ProBNP (last 3 results) No results for input(s): PROBNP in the last 8760 hours.   D-Dimer No results for input(s): DDIMER in the last 72 hours. Hemoglobin A1C No results for input(s): HGBA1C in the last 72 hours.  Fasting Lipid Panel No results for input(s): CHOL, HDL, LDLCALC, TRIG, CHOLHDL, LDLDIRECT in the last 72 hours. Thyroid Function Tests No results for  input(s): TSH, T4TOTAL, T3FREE, THYROIDAB in the last 72 hours.  Invalid input(s): FREET3  Other results:   Imaging    No results found.   Medications:     Scheduled Medications:  amantadine  200 mg Per Tube BID   apixaban  5 mg Per Tube BID   dofetilide  500 mcg Oral BID   feeding supplement (PROSource TF)  45 mL Per Tube Daily   finasteride  5 mg Oral Daily   free water  125 mL Per Tube Q4H   insulin aspart  0-9 Units Subcutaneous TID WC   lacosamide  50 mg Per Tube BID   mouth rinse  15 mL Mouth Rinse BID   multivitamin with minerals  1 tablet Per Tube Daily   sodium chloride flush  3 mL Intravenous Q12H   tamsulosin  0.4 mg Oral Daily    Infusions:  sodium chloride Stopped (12/20/20 1219)   sodium chloride     ampicillin (OMNIPEN) IV 2 g (12/21/20 0132)   cefTRIAXone (ROCEPHIN)  IV 2 g  (12/20/20 2117)   feeding supplement (OSMOLITE 1.2 CAL) 75 mL/hr at 12/21/20 0135    PRN Medications: sodium chloride, sodium chloride flush   Assessment/Plan   1. 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  Baseline, he is bed-bound and per wife communicates minimally, left hemiparesis.  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  Currently sleeping, but per notes has remained encephalopathic.  - Can restart atorvastatin via Cortrak.  2. Hypernatremia: Resolved, Na 135 today.  3. Enterococcal bacteremia: Source uncertain.  No PNA noted on CXR.  Enterococcus did not grow in urine.  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  He has a new left cerebellar CVA (small) that may be embolic from MV vegetation.  Blood cultures from 10/10, 10/11, and 10/13 were positive, but still negative from 10/15.  - Continues on ceftriaxone and ampicillin.  - ID following.  - Not candidate for cardiac surgery, management will have to be medical.  4. 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  Echo this admission with EF 65-70%, normal RV, bioprosthetic AVR mean gradient 16 mmHg appears normal, bioprosthetic MV mean gradient 10, increased from prior but HR also in 100s when study done.  TEE as above showed bioprosthetic MV vegetation with trivial MR and mean gradient 8.  5. Atrial fibrillation/Flutter: [place] in 2006 + 2 flutter/fib ablations at [facility]. Remains in NSR.  - Continue Tikosyn, QTc ok on initial ECG.  - Continue Eliquis.  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  6. Malnutrition: Albumin 1.5.   TFs via Cortrak.   Patient still has not regained baseline mental status.  Continue to follow, would consider palliative care consult/hospice if patient does not return to prior baseline.   Length of Stay: 6  Loralie Champagne, MD  12/21/2020, 8:02 AM  Advanced Heart Failure Team Pager [phone] (M-F; 7a - 5p)  Please contact [facility] for night-coverage after hours (5p -7a ) and weekends on [email or web address]

## 2020-12-22 ENCOUNTER — Inpatient Hospital Stay (HOSPITAL_COMMUNITY): Payer: Medicare Other

## 2020-12-22 DIAGNOSIS — E43 Unspecified severe protein-calorie malnutrition: Secondary | ICD-10-CM | POA: Diagnosis not present

## 2020-12-22 DIAGNOSIS — Z9889 Other specified postprocedural states: Secondary | ICD-10-CM | POA: Diagnosis not present

## 2020-12-22 DIAGNOSIS — Z8673 Personal history of transient ischemic attack (TIA), and cerebral infarction without residual deficits: Secondary | ICD-10-CM | POA: Diagnosis not present

## 2020-12-22 DIAGNOSIS — I38 Endocarditis, valve unspecified: Secondary | ICD-10-CM | POA: Diagnosis not present

## 2020-12-22 DIAGNOSIS — T826XXD Infection and inflammatory reaction due to cardiac valve prosthesis, subsequent encounter: Secondary | ICD-10-CM | POA: Diagnosis not present

## 2020-12-22 DIAGNOSIS — R7881 Bacteremia: Secondary | ICD-10-CM | POA: Diagnosis not present

## 2020-12-22 LAB — GLUCOSE, CAPILLARY
Glucose-Capillary: 173 mg/dL — ABNORMAL HIGH (ref 70–99)
Glucose-Capillary: 178 mg/dL — ABNORMAL HIGH (ref 70–99)
Glucose-Capillary: 179 mg/dL — ABNORMAL HIGH (ref 70–99)
Glucose-Capillary: 185 mg/dL — ABNORMAL HIGH (ref 70–99)
Glucose-Capillary: 194 mg/dL — ABNORMAL HIGH (ref 70–99)
Glucose-Capillary: 199 mg/dL — ABNORMAL HIGH (ref 70–99)
Glucose-Capillary: 206 mg/dL — ABNORMAL HIGH (ref 70–99)

## 2020-12-22 LAB — COMPREHENSIVE METABOLIC PANEL
ALT: 120 U/L — ABNORMAL HIGH (ref 0–44)
AST: 100 U/L — ABNORMAL HIGH (ref 15–41)
Albumin: 1.5 g/dL — ABNORMAL LOW (ref 3.5–5.0)
Alkaline Phosphatase: 74 U/L (ref 38–126)
Anion gap: 6 (ref 5–15)
BUN: 15 mg/dL (ref 8–23)
CO2: 24 mmol/L (ref 22–32)
Calcium: 7.3 mg/dL — ABNORMAL LOW (ref 8.9–10.3)
Chloride: 102 mmol/L (ref 98–111)
Creatinine, Ser: 0.7 mg/dL (ref 0.61–1.24)
GFR, Estimated: >60 mL/min (ref >60)
Glucose, Bld: 209 mg/dL — ABNORMAL HIGH (ref 70–99)
Potassium: 4.1 mmol/L (ref 3.5–5.1)
Sodium: 132 mmol/L — ABNORMAL LOW (ref 135–145)
Total Bilirubin: 0.4 mg/dL (ref 0.3–1.2)
Total Protein: 5.7 g/dL — ABNORMAL LOW (ref 6.5–8.1)

## 2020-12-22 LAB — URIC ACID: Uric Acid, Serum: 2 mg/dL — ABNORMAL LOW (ref 3.7–8.6)

## 2020-12-22 LAB — CBC
HCT: 26.4 % — ABNORMAL LOW (ref 39.0–52.0)
Hemoglobin: 8.8 g/dL — ABNORMAL LOW (ref 13.0–17.0)
MCH: 28.8 pg (ref 26.0–34.0)
MCHC: 33.3 g/dL (ref 30.0–36.0)
MCV: 86.3 fL (ref 80.0–100.0)
Platelets: 193 10*3/uL (ref 150–400)
RBC: 3.06 MIL/uL — ABNORMAL LOW (ref 4.22–5.81)
RDW: 16 % — ABNORMAL HIGH (ref 11.5–15.5)
WBC: 10 10*3/uL (ref 4.0–10.5)
nRBC: 0 % (ref 0.0–0.2)

## 2020-12-22 LAB — MAGNESIUM: Magnesium: 1.9 mg/dL (ref 1.7–2.4)

## 2020-12-22 MED ORDER — DOFETILIDE 250 MCG PO CAPS
375.0000 ug | ORAL_CAPSULE | Freq: Two times a day (BID) | ORAL | Status: DC
  Administered 2020-12-22 – 2021-01-05 (×28): 375 ug via ORAL
  Filled 2020-12-22 (×29): qty 1

## 2020-12-22 MED ORDER — MAGNESIUM SULFATE 2 GM/50ML IV SOLN
2.0000 g | Freq: Once | INTRAVENOUS | Status: AC
  Administered 2020-12-22: 2 g via INTRAVENOUS
  Filled 2020-12-22: qty 50

## 2020-12-22 MED ORDER — DICLOFENAC SODIUM 1 % EX GEL
4.0000 g | Freq: Four times a day (QID) | CUTANEOUS | Status: DC
  Administered 2020-12-22 – 2021-01-05 (×54): 4 g via TOPICAL
  Filled 2020-12-22: qty 100

## 2020-12-22 NOTE — Evaluation (Addendum)
Physical Therapy Evaluation Patient Details Name: [name] [name] MRN: [id number] DOB: December 29, 1940 Today's Date: 12/22/2020  History of Present Illness  80 y/o male presented to ED on 10/10 for AMS and hypernatremia. TEE suggestive of bioprosthetic valve endocarditis. 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  Clinical Impression   Pt admitted secondary to problem above with deficits below. Four weeks PTA patient was ambulating with one person assist with PT with rail.  Pt currently requires total assist due to not responding to commands. Patient was able to safely consume nectar thick liquids with SLP earlier today and may show signs of becoming more alert and potentially will be able to participate with PT. PT will monitor 1x/week for 2 weeks to assess for ability to participate and further goals to be assessed as appropriate.   Wife disappointed that PT will not be performing PROM and was under the misunderstanding that PROM would preserve his muscle strength. Educated that it can only maintain his flexibility and he currently is getting adequate ROM via nursing care as his ROM was WNL.        Recommendations for follow up therapy are one component of a multi-disciplinary discharge planning process, led by the attending physician.  Recommendations may be updated based on patient status, additional functional criteria and insurance authorization.  Follow Up Recommendations  (long-term care as not able to participate in PT at this time; will monitor)    Equipment Recommendations  None recommended by PT    Recommendations for Other Services       Precautions / Restrictions        Mobility  Bed Mobility Overal bed mobility: Needs Assistance             General bed mobility comments: total assist with no active  movement    Transfers                    Ambulation/Gait                Stairs            Wheelchair Mobility    Modified Rankin (Stroke Patients Only)       Balance                                             Pertinent Vitals/Pain Pain Assessment: Faces Faces Pain Scale: Hurts whole lot Pain Location: grimaces and withdraws when RUE moved to place on pillow Pain Descriptors / Indicators: Grimacing Pain Intervention(s): Limited activity within patient's tolerance;Repositioned    Home Living Family/patient expects to be discharged to:: Skilled nursing facility                 Additional Comments: has been in SNF since July 2022    Prior Function Level of Independence: Needs assistance   Gait / Transfers Assistance Needed: 4 weeks prior to 10/18 pt was walking with one person assist along // bars with PT  ADL's / Homemaking Assistance Needed: was working with OT prior to recent decline        Hand Dominance   Dominant Hand: Right    Extremity/Trunk Assessment   Upper Extremity Assessment Upper Extremity Assessment: LUE  deficits/detail (RUE xray pending and not assessed due to significant pain when pillow placed for edema management) LUE Deficits / Details: no active movement; + IR and flexion tone noted with painful stimuli; PROM WFL with shoulder flexion and abdct limited to 90 due to lying supine without scapular movement    Lower Extremity Assessment Lower Extremity Assessment: RLE deficits/detail;LLE deficits/detail RLE Deficits / Details: PROM with no active movement; + toe flickers into extension with painful stimuli; rt hip with rotation to neutral and with stretching to 20 degrees (prior history of rt hip surgery) LLE Deficits / Details: PROM WFL no active movement noted; no withdrawal to pain noted       Communication   Communication: Other (comment) (no attempts to speak)  Cognition Arousal/Alertness:  Lethargic (opened eyes for at most 2 minutes during assessment) Behavior During Therapy: Flat affect Overall Cognitive Status: Difficult to assess                                        General Comments General comments (skin integrity, edema, etc.): Patient was able to attend to eating pureed/nectar thick foods earlier today with SLP.    Exercises     Assessment/Plan    PT Assessment Patient needs continued PT services  PT Problem List Decreased strength;Decreased activity tolerance;Decreased mobility;Decreased cognition       PT Treatment Interventions Functional mobility training;Therapeutic activities;Therapeutic exercise;Cognitive remediation;Neuromuscular re-education;Patient/family education    PT Goals (Current goals can be found in the Care Plan section)  Acute Rehab PT Goals Patient Stated Goal: unable PT Goal Formulation: With family Time For Goal Achievement: 01/05/21 Potential to Achieve Goals: Poor    Frequency Min 1X/week (will monitor for increased ability to participate per family request; if no change in 2 weeks will discharge)   Barriers to discharge        Co-evaluation               AM-PAC PT "6 Clicks" Mobility  Outcome Measure Help needed turning from your back to your side while in a flat bed without using bedrails?: Total Help needed moving from lying on your back to sitting on the side of a flat bed without using bedrails?: Total Help needed moving to and from a bed to a chair (including a wheelchair)?: Total Help needed standing up from a chair using your arms (e.g., wheelchair or bedside chair)?: Total Help needed to walk in hospital room?: Total Help needed climbing 3-5 steps with a railing? : Total 6 Click Score: 6    End of Session   Activity Tolerance: Patient tolerated treatment well (RUE not assessed due to pending xray) Patient left: in bed;with family/visitor present Nurse Communication: Other (comment)  (continue PROM with patient care activities) PT Visit Diagnosis: Hemiplegia and hemiparesis Hemiplegia - Right/Left: Left Hemiplegia - dominant/non-dominant: Non-dominant Hemiplegia - caused by: Cerebral infarction    Time: 3888-2800 PT Time Calculation (min) (ACUTE ONLY): 27 min   Charges:   PT Evaluation $PT Eval Low Complexity: 1 Low PT Treatments $Therapeutic Exercise: 8-22 mins         Arby Barrette, PT Acute Rehabilitation Services  Pager [phone] Office [phone]   Rexanne Mano 12/22/2020, 1:41 PM

## 2020-12-22 NOTE — Progress Notes (Signed)
Speech Language Pathology Treatment: Dysphagia  Patient Details Name: [name] [name] MRN: [id number] DOB: May 22, 1940 Today's Date: 12/22/2020 Time: 1106-1140 SLP Time Calculation (min) (ACUTE ONLY): 34 min  Assessment / Plan / Recommendation Clinical Impression  Pt was seen for ongoing dysphagia management with supportive family at bedside.  SLP provided oral care prior to administration of PO trials.  Pt remains lethargic but was able to participate in PO trials today.  Pt exhibited prompt oral response to ice chip, but no pharyngeal swallow was palpated.  Suction used to clear.  With small amount of water by spoon there was palpable pharyngeal swallow response and no clinical s/s of aspiration.  Pt tolerated multiple trials of nectar thick liquid by spoon, but was unable to sip from cup.  Pt has been consuming nectar at baseline and family report recent spoon use prior to NGT placement.  Pt tolerated puree with no clinical s/s of aspiration and exhibited adequate oral clearance following prolonged oral phase which is suspected to be related to decreased LOA. Pt intermittently required cuing to attend to bolus. Placed moisturizing gel on mouth and in oral cavity.  Provided oral care supplies and answered family questions and shared treatment plans.  Recommend puree diet with thin liquid.     HPI HPI: 80 y.o. 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  TEE completed on 12/18/20. Pt has a hx of cognitive deficits and dysphagia, with most recent MBS on 09/02/20 and most recent diet recommendations for Dysphagia 1 solids and honey-thick liquids.  Per family, pt has since advanced to mechanical  soft solid and NTL.  Cortrak placed 12/18/20.      SLP Plan  Continue with current plan of care      Recommendations for follow up therapy are one component of a multi-disciplinary discharge planning process, led by the attending physician.  Recommendations may be updated based on patient status, additional functional criteria and insurance authorization.    Recommendations  Diet recommendations: Dysphagia 1 (puree);Nectar-thick liquid Liquids provided via: Teaspoon Medication Administration: Crushed with puree Supervision: Trained caregiver to feed patient Compensations: Slow rate;Small sips/bites Postural Changes and/or Swallow Maneuvers: Seated upright 90 degrees                Oral Care Recommendations: Oral care BID;Staff/trained caregiver to provide oral care Follow up Recommendations: Skilled Nursing facility SLP Visit Diagnosis: Dysphagia, unspecified (R13.10) Plan: Continue with current plan of care       [place] , [place], [place] Office: [phone]   12/22/2020, 12:04 PM

## 2020-12-22 NOTE — Progress Notes (Signed)
STROKE TEAM PROGRESS NOTE   SUBJECTIVE (INTERVAL HISTORY) His wife is at the bedside. Pt no significant change of mental status. ID and cardiology on board, pt 10/15 blood culture showed 1/2 positive for STAPHYLOCOCCUS EPIDERMIDIS which is different from previous Enterocus Facelis and seems to be contamination. Now resent 10/17 blood culture.    OBJECTIVE Temp:  [97.4 F (36.3 C)-98.7 F (37.1 C)] 97.4 F (36.3 C) (10/18 1607) Pulse Rate:  [75-84] 77 (10/18 1607) Cardiac Rhythm: Heart block;Normal sinus rhythm (10/18 0700) Resp:  [19-25] 25 (10/18 1607) BP: (101-137)/(64-84) 101/64 (10/18 1607) SpO2:  [92 %-98 %] 92 % (10/18 1607) Weight:  [85.7 kg] 85.7 kg (10/18 0431)  Recent Labs  Lab 12/21/20 2326 12/22/20 0430 12/22/20 0737 12/22/20 1213 12/22/20 1609  GLUCAP 180* 206* 185* 194* 178*   Recent Labs  Lab 12/18/20 0221 12/18/20 1210 12/18/20 1630 12/19/20 0610 12/19/20 1632 12/20/20 0153 12/21/20 0151 12/22/20 0224  NA 143  --   --  139  --  137 135 132*  K 3.7  --   --  3.5  --  4.2 4.0 4.1  CL 114*  --   --  108  --  109 104 102  CO2 24  --   --  23  --  23 24 24   GLUCOSE 152*  --   --  186*  --  190* 171* 209*  BUN 18  --   --  16  --  15 15 15   CREATININE 0.78  --   --  0.76  --  0.69 0.65 0.70  CALCIUM 7.6*  --   --  7.7*  --  7.5* 7.5* 7.3*  MG 2.0 1.9 1.9 2.0 1.9 1.9  --  1.9  PHOS  --  2.8 3.1 2.7 2.4*  --   --   --    Recent Labs  Lab 12/16/20 0130 12/19/20 0610 12/20/20 0153 12/21/20 0151 12/22/20 0224  AST 56* 64* 60* 72* 100*  ALT 76* 77* 72* 86* 120*  ALKPHOS 66 68 61 69 74  BILITOT 0.5 0.4 0.5 0.3 0.4  PROT 5.8* 5.7* 5.3* 5.5* 5.7*  ALBUMIN 1.9* 1.7* 1.6* 1.5* 1.5*   Recent Labs  Lab 12/16/20 0130 12/18/20 0221 12/19/20 0610 12/20/20 0153 12/21/20 0151 12/22/20 0224  WBC 10.7* 9.1 10.1 9.1 9.9 10.0  NEUTROABS 7.7  --   --   --   --   --   HGB 9.9* 8.8* 8.8* 8.7* 8.8* 8.8*  HCT 30.7* 27.3* 27.6* 26.8* 26.6* 26.4*  MCV 88.0 88.1  87.9 87.9 86.9 86.3  PLT 106* 132* 176 191 192 193   No results for input(s): CKTOTAL, CKMB, CKMBINDEX, TROPONINI in the last 168 hours. No results for input(s): LABPROT, INR in the last 72 hours. No results for input(s): COLORURINE, LABSPEC, Brent, GLUCOSEU, HGBUR, BILIRUBINUR, KETONESUR, PROTEINUR, UROBILINOGEN, NITRITE, LEUKOCYTESUR in the last 72 hours.  Invalid input(s): APPERANCEUR     Component Value Date/Time   CHOL 119 08/08/2020 0625   CHOL 102 11/26/2013 0000   TRIG 105 08/08/2020 0625   TRIG 93 11/26/2013 0000   HDL 42 08/08/2020 0625   CHOLHDL 2.8 08/08/2020 0625   VLDL 21 08/08/2020 0625   LDLCALC 56 08/08/2020 0625   LDLCALC 58 11/26/2013 0000   Lab Results  Component Value Date   HGBA1C 7.3 (H) 12/17/2020      Component Value Date/Time   LABOPIA NONE DETECTED 08/05/2013 2207   COCAINSCRNUR NONE DETECTED 08/05/2013 2207  LABBENZ NONE DETECTED 08/05/2013 2207   AMPHETMU NONE DETECTED 08/05/2013 2207   THCU NONE DETECTED 08/05/2013 2207   LABBARB NONE DETECTED 08/05/2013 2207    No results for input(s): ETH in the last 168 hours.  I have personally reviewed the radiological images below and agree with the radiology interpretations.  DG Wrist 2 Views Right  Result Date: 12/22/2020 CLINICAL DATA:  Tenderness with movement. EXAM: RIGHT WRIST - 2 VIEW COMPARISON:  None. FINDINGS: Radiocarpal joint appears within normal limits. There are degenerative changes the articulation between the scaphoid and the multangular bones and at the first and second carpometacarpal articulations. No sign of fracture. Regional arterial calcification incidentally noted. IMPRESSION: Radial side degenerative changes as described above which could certainly be painful. Electronically Signed   By: Nelson Chimes M.D.   On: 12/22/2020 13:51   CT Head Wo Contrast  Result Date: 12/14/2020 CLINICAL DATA:  Mental status change, unknown cause EXAM: CT HEAD WITHOUT CONTRAST TECHNIQUE:  Contiguous axial images were obtained from the base of the skull through the vertex without intravenous contrast. 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 Old left parietal infarct. Findings are stable since prior study. Small right subdural hematoma again noted overlying the right frontoparietal lobe, 3 mm compared to 5 mm previously. There is atrophy and chronic small vessel disease changes. Ventriculomegaly likely rib related to ex vacuo dilatation. No acute infarct or acute hemorrhage. Vascular: No hyperdense vessel or unexpected calcification. Skull: Prior right temporoparietal craniotomy. No acute calvarial abnormality. Sinuses/Orbits: No acute findings Other: None IMPRESSION: Small right subdural hematoma, 3 mm compared with 5 mm previously. No new hemorrhage. Old right MCA and left parietal infarcts, stable. Ventriculomegaly related to ex vacuo dilatation. Atrophy, chronic microvascular disease. No acute intracranial abnormality. Electronically Signed   By: Rolm Baptise M.D.   On: 12/14/2020 13:50   MR BRAIN WO CONTRAST  Result Date: 12/18/2020 CLINICAL DATA:  Mental status change, unknown cause EXAM: MRI HEAD WITHOUT CONTRAST TECHNIQUE: Multiplanar, multiecho pulse sequences of the brain and surrounding structures were obtained without intravenous contrast. COMPARISON:  08/29/2020 FINDINGS: Motion artifact is present. Brain: Small focus of reduced diffusion is present within the left cerebellar vermis. Diffusion hyperintensity in the region of prior right MCA infarct is probably artifactual. There is a 1.2 cm subacute hematoma of the anterior right superior frontal gyrus (series 6, image 18). Chronic right MCA territory infarction with chronic blood products. Ex vacuo dilatation of the adjacent right lateral ventricle. Chronic infarct on the left near junction of parietal, temporal, and occipital lobes also with chronic blood products. Additional confluent  T2 hyperintensity in the supratentorial white matter is nonspecific but may reflect chronic microvascular ischemic changes. Disproportionate prominence of the lateral and third ventricles is unchanged. Thin extra-axial collection underlies craniotomy. Vascular: Major vessel flow voids at the skull base are preserved. Skull and upper cervical spine: Normal marrow signal is preserved. Prior right craniotomy. Sinuses/Orbits: Minor mucosal thickening. No acute orbital abnormality. Other: Sella is unremarkable.  Mastoid air cells are clear. IMPRESSION: Small acute infarct left cerebellar vermis. Small subacute hematoma anterior right frontal lobe (isodense on CT). Stable chronic findings of infarcts, volume loss, and chronic microvascular ischemic changes. Electronically Signed   By: Macy Mis M.D.   On: 12/18/2020 12:49   DG Chest Port 1 View  Result Date: 12/14/2020 CLINICAL DATA:  Altered mental status EXAM: PORTABLE CHEST 1 VIEW COMPARISON:  Chest radiograph 08/07/2020 FINDINGS: Median sternotomy wires and mitral and  aortic valve prostheses are stable. The cardiomediastinal silhouette is stable. There is calcified atherosclerotic plaque of the aortic arch. Lung volumes are low. There is no focal consolidation or pulmonary edema. There is no pleural effusion or pneumothorax. There is no acute osseous abnormality. IMPRESSION: Low lung volumes. Otherwise, no radiographic evidence of acute cardiopulmonary process. Electronically Signed   By: Valetta Mole M.D.   On: 12/14/2020 13:01   DG Abd Portable 1V  Result Date: 12/18/2020 CLINICAL DATA:  Feeding tube placement EXAM: PORTABLE ABDOMEN - 1 VIEW COMPARISON:  09/01/2020 FINDINGS: Feeding tube terminates at the distal stomach. Cardiomegaly with median sternotomy and left base airspace disease. Non-obstructive bowel gas pattern. No gross free intraperitoneal air. IMPRESSION: Feeding tube terminating at the distal stomach. Electronically Signed   By: Abigail Miyamoto M.D.   On: 12/18/2020 16:44   EEG adult  Result Date: 12/17/2020 Lora Havens, MD     12/17/2020  1:00 PM Patient Name: [name] [name] MRN: [id number] Epilepsy Attending: Lora Havens Referring Physician/Provider: Dr Oren Binet Date: 12/17/2020 Duration: 24.04 mins Patient history: 79 year old male with recent subdural hematoma in May, seizures who presented with altered mental status.  EEG to evaluate for seizures. Level of alertness: Awake AEDs during EEG study: LEV Technical aspects: This EEG study was done with scalp electrodes positioned according to the 10-20 International system of electrode placement. Electrical activity was acquired at a sampling rate of 500Hz  and reviewed with a high frequency filter of 70Hz  and a low frequency filter of 1Hz . EEG data were recorded continuously and digitally stored. Description: The posterior dominant rhythm consists of 8-9 Hz activity of moderate voltage (25-35 uV) seen predominantly in posterior head regions, symmetric and reactive to eye opening and eye closing. EEG showed continuous low amplitude 2 to 3 Hz delta slowing in right frontotemporal region. Intermittent generalized 3 to 5 Hz theta-delta slowing was also noted. Hyperventilation and photic stimulation were not performed.   ABNORMALITY - Continuous slow, right frontotemporal region - Intermittent slow, generalized IMPRESSION: This study is suggestive of cortical dysfunction in right frontotemporal region likely secondary to underlying structural abnormality/stroke/subdural hemorrhage.  Additionally there is mild diffuse encephalopathy, nonspecific etiology.  No seizures or epileptiform discharges were seen throughout the recording.  If suspicion for ictal- interictal activity remains a concern, a prolonged study can be considered. Priyanka Barbra Sarks   Overnight EEG with video  Result Date: 12/18/2020 Lora Havens, MD     12/19/2020  7:36 AM Patient Name: [name] [name] MRN:  [id number] Epilepsy Attending: Lora Havens Referring Physician/Provider: Dr Oren Binet Duration: 12/17/2020 1638 to 12/18/2020 1002  Patient history: 80 year old male with recent subdural hematoma in May, seizures who presented with altered mental status.  EEG to evaluate for seizures.  Level of alertness: Awake, asleep  AEDs during EEG study: LEV  Technical aspects: This EEG study was done with scalp electrodes positioned according to the 10-20 International system of electrode placement. Electrical activity was acquired at a sampling rate of 500Hz  and reviewed with a high frequency filter of 70Hz  and a low frequency filter of 1Hz . EEG data were recorded continuously and digitally stored.  Description: The posterior dominant rhythm consists of 8-9 Hz activity of moderate voltage (25-35 uV) seen predominantly in posterior head regions, symmetric and reactive to eye opening and eye closing.  Sleep was characterized by vertex waves, sleep spindles (12 to 14 Hz), maximal frontocentral region.  EEG showed continuous low amplitude 2 to 3 Hz  delta slowing in right frontotemporal region. Hyperventilation and photic stimulation were not performed.    ABNORMALITY - Continuous slow, right frontotemporal region  IMPRESSION: This study is suggestive of cortical dysfunction in right frontotemporal region likely secondary to underlying structural abnormality/stroke/subdural hemorrhage. No seizures or epileptiform discharges were seen throughout the recording. Lora Havens   ECHOCARDIOGRAM COMPLETE  Result Date: 12/15/2020    ECHOCARDIOGRAM REPORT   Patient Name:   [name] [name] Date of Exam: 12/15/2020 Medical Rec #:  [id number]     Height:       67.0 in Accession #:    [id number]    Weight:       166.2 lb Date of Birth:  1940-06-24     BSA:          1.869 m Patient Age:    73 years      BP:           120/67 mmHg Patient Gender: M             HR:           98 bpm. Exam Location:  Inpatient Procedure: 2D Echo,  Cardiac Doppler, Color Doppler and Intracardiac            Opacification Agent Indications:    Fever R50.9  History:        Patient has prior history of Echocardiogram examinations, most                 recent 08/07/2020. Stroke; Arrythmias:Atrial Fibrillation. Past                 history of rheumatic fever, acute rheumatic pericarditis.                 Thoracic aortic aneurysm.                 Aortic Valve: 27 mm Carpentier-Edwards prosthetic valve is                 present in the aortic position. Procedure Date: 10/18/2004.  Sonographer:    Darlina Sicilian RDCS Sonographer#2:  Darlina Sicilian RDCS Referring Phys: [id number] [place]  1. The mitral valve has been replaced with an unknown bioprosthesis. No evidence of mitral valve regurgitation. The mean mitral valve gradient is 10.0 mmHg with average heart rate of 101 bpm. PHT 70 ms. Suboptimal LVOT spectral Doppler- VTI deferred. Date of Procedure Date: 10/18/2004.  2. The aortic valve has been repaired/replaced. Aortic valve regurgitation is not visualized. 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 Procedure Date: 10/18/2004. Aortic valve mean gradient measures 16.0 mmHg.  Aortic valve acceleration time measures 74 msec.  3. Left ventricular ejection fraction, by estimation, is 65 to 70%. The left ventricle has normal function. The left ventricle has no regional wall motion abnormalities. There is mild concentric left ventricular hypertrophy. Left ventricular diastolic parameters are indeterminate.  4. Aortic dilatation noted. 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  5. Right ventricular systolic function is normal. The right ventricular size is normal. Comparison(s): A prior study was performed on 08/07/2020. Prior images reviewed side by side. Stable aortic root and ascending aorta size. Increased mitral  valve gradients, but at higher hear rates from prior. FINDINGS  Left Ventricle: Left ventricular ejection fraction, by estimation, is 65 to 70%. The left ventricle has normal function.  The left ventricle has no regional wall motion abnormalities. Definity contrast agent was given IV to delineate the left ventricular  endocardial borders. The left ventricular internal cavity size was normal in size. There is mild concentric left ventricular hypertrophy. Left ventricular diastolic parameters are indeterminate. Right Ventricle: The right ventricular size is normal. No increase in right ventricular wall thickness. Right ventricular systolic function is normal. Left Atrium: Left atrial size was normal in size. Right Atrium: Right atrial size was normal in size. Pericardium: There is no evidence of pericardial effusion. Mitral Valve: The mitral valve has been repaired/replaced. No evidence of mitral valve regurgitation. There is a 29 mm Carpentier-Edwards prosthetic present in the mitral position. MV peak gradient, 17.1 mmHg. The mean mitral valve gradient is 10.0 mmHg with average heart rate of 101 bpm. Tricuspid Valve: The tricuspid valve is grossly normal. Tricuspid valve regurgitation is mild . No evidence of tricuspid stenosis. Aortic Valve: The aortic valve has been repaired/replaced. Aortic valve regurgitation is not visualized. Aortic valve mean gradient measures 16.0 mmHg. Aortic valve peak gradient measures 24.4 mmHg. Aortic valve area, by VTI measures 1.56 cm. There is a  27 mm Carpentier-Edwards prosthetic valve present in the aortic position. Procedure Date: 10/18/2004. Pulmonic Valve: The pulmonic valve was normal in structure. Pulmonic valve regurgitation is mild. No evidence of pulmonic stenosis. Aorta: The ascending aorta was not well visualized and aortic dilatation noted. There is moderate dilatation of the aortic root, measuring 48 mm. There is moderate dilatation of the ascending aorta, measuring 49  mm. IAS/Shunts: The atrial septum is grossly normal.  LEFT VENTRICLE PLAX 2D LVIDd:         4.80 cm LVIDs:         3.50 cm LV PW:         1.30 cm LV IVS:        1.30 cm LVOT diam:     3.25 cm LV SV:         62 LV SV Index:   33 LVOT Area:     8.30 cm  LV Volumes (MOD) LV vol d, MOD A2C: 81.2 ml LV vol d, MOD A4C: 94.4 ml LV vol s, MOD A2C: 18.0 ml LV vol s, MOD A4C: 30.1 ml LV SV MOD A2C:     63.2 ml LV SV MOD A4C:     94.4 ml LV SV MOD BP:      63.9 ml RIGHT VENTRICLE RV S prime:     10.40 cm/s LEFT ATRIUM             Index        RIGHT ATRIUM          Index LA diam:        4.50 cm 2.41 cm/m   RA Area:     8.74 cm LA Vol (A2C):   58.1 ml 31.08 ml/m  RA Volume:   12.60 ml 6.74 ml/m LA Vol (A4C):   56.9 ml 30.44 ml/m LA Biplane Vol: 58.9 ml 31.51 ml/m  AORTIC VALVE AV Area (Vmax):    5.29 cm AV Area (Vmean):   1.55 cm AV Area (VTI):     1.56 cm AV Vmax:           247.00 cm/s AV Vmean:          190.500 cm/s AV VTI:            0.401 m AV Peak Grad:      24.4 mmHg AV  Mean Grad:      16.0 mmHg LVOT Vmax:         157.40 cm/s LVOT Vmean:        35.700 cm/s LVOT VTI:          0.075 m LVOT/AV VTI ratio: 0.19  AORTA Ao Asc diam: 4.90 cm MITRAL VALVE                TRICUSPID VALVE MV Area (PHT): 3.16 cm     TR Peak grad:   26.4 mmHg MV Area VTI:   1.39 cm     TR Vmax:        257.00 cm/s MV Peak grad:  17.1 mmHg MV Mean grad:  10.0 mmHg    SHUNTS MV Vmax:       2.07 m/s     Systemic VTI:  0.08 m MV Vmean:      151.0 cm/s   Systemic Diam: 3.25 cm MV Decel Time: 240 msec MV E velocity: 195.00 cm/s Rudean Haskell MD Electronically signed by Rudean Haskell MD Signature Date/Time: 12/15/2020/12:49:20 PM    Final       PHYSICAL EXAM  Temp:  [97.4 F (36.3 C)-98.7 F (37.1 C)] 97.4 F (36.3 C) (10/18 1607) Pulse Rate:  [75-84] 77 (10/18 1607) Resp:  [19-25] 25 (10/18 1607) BP: (101-137)/(64-84) 101/64 (10/18 1607) SpO2:  [92 %-98 %] 92 % (10/18 1607) Weight:  [85.7 kg] 85.7 kg (10/18  0431)  General - Well nourished, well developed, in no apparent distress.  Ophthalmologic - fundi not visualized due to noncooperation.  Cardiovascular - Regular rhythm and rate, not in afib.  Neuro - eyes closed, with repetitive stimulation, he was able to open eyes and able to maintain eyes open for several minutes.  However, nonverbal, not follow commands.  Able to blinking spontaneous, however not blinking to visual threat bilaterally.  No tracking.  Not interactive.  Mild left facial droop.  Tongue protrusion not corporative.  Left upper and lower extremity hemiplegia except left lower extremity slight withdraw with pain stimulation.  Right upper extremity localizing to pain, purposeful barely against gravity.  Right lower extremity no spontaneous movement, however withdrawal to pain 2/5. Sensation, coordination and gait not tested.   ASSESSMENT/PLAN [name] [name] is a 80 y.o. 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 fib status post maze procedure and LAA clipping, stroke admitted for lethargy, weight loss, bacteremia and altered mental status. No tPA given due to outside window.    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  Ongoing aggressive stroke risk factor management Therapy recommendations: Pending Disposition: Pending  Bacteremia Endocarditis UTI TEE showed small mitral wall vegetation Blood culture positive for Enterococcus->repeat Cx 10/13 1/2 positive->repeat Cx 10/15 again 1/2 positive but likely contamination  Blood Cx 10/17 pending Urine culture positive for  staph aureus Currently on ampicillin and Rocephin ID on board, concerning persistent positive BCx and poor prognosis.   PAF Status post maze and left AA clipping Was on Coumadin before Now on Eliquis and Tikosyn Cardiology on board  History of stroke, SDH and seizure Stroke in 08/2013 07/27/2020 admitted for traumatic right large SDH status post bur hole evacuation.  Coumadin was on hold due to SDH 08/07/2020 admitted for altered mental status with left-sided weakness, CT showed right MCA large infarct.  CTA head and neck right ICA and right M2 occlusion status post IR with TICI2c revascularization.  EF 60 to 65%, LDL 56, A1c 6.2, patient discharged to [facility].  Started Eliquis on 08/25/2020 in CIR. 08/28/2020 had a seizure activity, MRI no significant change, put on Keppra. Current admission, EEG and long-term EEG no seizure.  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 After hours, contact General  Neurology

## 2020-12-22 NOTE — Progress Notes (Signed)
INFECTIOUS DISEASE PROGRESS NOTE  ID: [name] [name] is a 80 y.o. male with  Active Problems:   Pressure injury of skin   Hypernatremia   Bacteremia   S/P MVR (mitral valve repair)   History of CVA in adulthood   Protein-calorie malnutrition, severe   Prosthetic valve endocarditis (HCC)  Subjective: No response   Abtx:  Anti-infectives (From admission, onward)    Start     Dose/Rate Route Frequency Ordered Stop   12/18/20 1500  cefTRIAXone (ROCEPHIN) 2 g in sodium chloride 0.9 % 100 mL IVPB        2 g 200 mL/hr over 30 Minutes Intravenous Every 12 hours 12/18/20 1344     12/15/20 1700  cefTRIAXone (ROCEPHIN) 1 g in sodium chloride 0.9 % 100 mL IVPB  Status:  Discontinued        1 g 200 mL/hr over 30 Minutes Intravenous Every 24 hours 12/14/20 1648 12/15/20 0624   12/15/20 0630  ampicillin (OMNIPEN) 2 g in sodium chloride 0.9 % 100 mL IVPB        2 g 300 mL/hr over 20 Minutes Intravenous Every 6 hours 12/15/20 0624     12/15/20 0630  gentamicin (GARAMYCIN) IVPB 80 mg        80 mg 100 mL/hr over 30 Minutes Intravenous  Once 12/15/20 0628 12/15/20 0732   12/14/20 1645  cefTRIAXone (ROCEPHIN) 1 g in sodium chloride 0.9 % 100 mL IVPB        1 g 200 mL/hr over 30 Minutes Intravenous  Once 12/14/20 1642 12/14/20 1830       Medications: Scheduled:  amantadine  200 mg Per Tube BID   apixaban  5 mg Per Tube BID   atorvastatin  80 mg Per Tube Daily   dofetilide  500 mcg Oral BID   feeding supplement (PROSource TF)  45 mL Per Tube Daily   finasteride  5 mg Oral Daily   free water  125 mL Per Tube Q4H   insulin aspart  0-9 Units Subcutaneous TID WC   lacosamide  50 mg Per Tube BID   magnesium gluconate  500 mg Per Tube Daily   mouth rinse  15 mL Mouth Rinse BID   multivitamin with minerals  1 tablet Per Tube Daily   sodium chloride flush  3 mL Intravenous Q12H   tamsulosin  0.4 mg Oral Daily    Objective: Vital signs in last 24 hours: Temp:  [98 F (36.7 C)-99.1 F  (37.3 C)] 98.7 F (37.1 C) (10/18 0844) Pulse Rate:  [75-87] 84 (10/18 0844) Resp:  [20-26] 20 (10/18 0844) BP: (98-137)/(66-84) 137/84 (10/18 0844) SpO2:  [92 %-98 %] 98 % (10/18 0844) Weight:  [85.7 kg] 85.7 kg (10/18 0431)   General appearance: no distress and no response  Lab Results Recent Labs    12/21/20 0151 12/22/20 0224  WBC 9.9 10.0  HGB 8.8* 8.8*  HCT 26.6* 26.4*  NA 135 132*  K 4.0 4.1  CL 104 102  CO2 24 24  BUN 15 15  CREATININE 0.65 0.70   Liver Panel Recent Labs    12/21/20 0151 12/22/20 0224  PROT 5.5* 5.7*  ALBUMIN 1.5* 1.5*  AST 72* 100*  ALT 86* 120*  ALKPHOS 69 74  BILITOT 0.3 0.4   Sedimentation Rate No results for input(s): ESRSEDRATE in the last 72 hours. C-Reactive Protein No results for input(s): CRP in the last 72 hours.  Microbiology: Recent Results (from the past 240  hour(s))  Culture, blood (Routine x 2)     Status: Abnormal   Collection Time: 12/14/20 12:45 PM   Specimen: BLOOD RIGHT FOREARM  Result Value Ref Range Status   Specimen Description BLOOD RIGHT FOREARM  Final   Special Requests   Final    BOTTLES DRAWN AEROBIC AND ANAEROBIC Blood Culture adequate volume   Culture  Setup Time   Final    GRAM POSITIVE COCCI IN BOTH AEROBIC AND ANAEROBIC BOTTLES CRITICAL RESULT CALLED TO, READ BACK BY AND VERIFIED WITH: PHARMD JAMES LEDFORD 12/15/2020@6 :07 BY TW Performed at [facility], [place] [address], [place], [place] [address]    Culture ENTEROCOCCUS FAECALIS (A)  Final   Report Status 12/17/2020 FINAL  Final   Organism ID, Bacteria ENTEROCOCCUS FAECALIS  Final      Susceptibility   Enterococcus faecalis - MIC*    AMPICILLIN <=2 SENSITIVE Sensitive     VANCOMYCIN 2 SENSITIVE Sensitive     GENTAMICIN SYNERGY SENSITIVE Sensitive     * ENTEROCOCCUS FAECALIS  Blood Culture ID Panel (Reflexed)     Status: Abnormal   Collection Time: 12/14/20 12:45 PM  Result Value Ref Range Status   Enterococcus faecalis DETECTED (A)  NOT DETECTED Final    Comment: CRITICAL RESULT CALLED TO, READ BACK BY AND VERIFIED WITH: PHARMD JAMES LEDFORD 12/15/2020@6 :07 BY TW    Enterococcus Faecium NOT DETECTED NOT DETECTED Final   Listeria monocytogenes NOT DETECTED NOT DETECTED Final   Staphylococcus species NOT DETECTED NOT DETECTED Final   Staphylococcus aureus (BCID) NOT DETECTED NOT DETECTED Final   Staphylococcus epidermidis NOT DETECTED NOT DETECTED Final   Staphylococcus lugdunensis NOT DETECTED NOT DETECTED Final   Streptococcus species NOT DETECTED NOT DETECTED Final   Streptococcus agalactiae NOT DETECTED NOT DETECTED Final   Streptococcus pneumoniae NOT DETECTED NOT DETECTED Final   Streptococcus pyogenes NOT DETECTED NOT DETECTED Final   A.calcoaceticus-baumannii NOT DETECTED NOT DETECTED Final   Bacteroides fragilis NOT DETECTED NOT DETECTED Final   Enterobacterales NOT DETECTED NOT DETECTED Final   Enterobacter cloacae complex NOT DETECTED NOT DETECTED Final   Escherichia coli NOT DETECTED NOT DETECTED Final   Klebsiella aerogenes NOT DETECTED NOT DETECTED Final   Klebsiella oxytoca NOT DETECTED NOT DETECTED Final   Klebsiella pneumoniae NOT DETECTED NOT DETECTED Final   Proteus species NOT DETECTED NOT DETECTED Final   Salmonella species NOT DETECTED NOT DETECTED Final   Serratia marcescens NOT DETECTED NOT DETECTED Final   Haemophilus influenzae NOT DETECTED NOT DETECTED Final   Neisseria meningitidis NOT DETECTED NOT DETECTED Final   Pseudomonas aeruginosa NOT DETECTED NOT DETECTED Final   Stenotrophomonas maltophilia NOT DETECTED NOT DETECTED Final   Candida albicans NOT DETECTED NOT DETECTED Final   Candida auris NOT DETECTED NOT DETECTED Final   Candida glabrata NOT DETECTED NOT DETECTED Final   Candida krusei NOT DETECTED NOT DETECTED Final   Candida parapsilosis NOT DETECTED NOT DETECTED Final   Candida tropicalis NOT DETECTED NOT DETECTED Final   Cryptococcus neoformans/gattii NOT DETECTED NOT  DETECTED Final   Vancomycin resistance NOT DETECTED NOT DETECTED Final    Comment: Performed at [facility] [place], [place] [address], [place], [place] [address]  Resp Panel by RT-PCR (Flu A&B, Covid) Nasopharyngeal Swab     Status: None   Collection Time: 12/14/20  3:41 PM   Specimen: Nasopharyngeal Swab; Nasopharyngeal(NP) swabs in vial transport medium  Result Value Ref Range Status   SARS Coronavirus 2 by RT PCR NEGATIVE NEGATIVE  Final    Comment: (NOTE) SARS-CoV-2 target nucleic acids are NOT DETECTED.  The SARS-CoV-2 RNA is generally detectable in upper respiratory specimens during the acute phase of infection. The lowest concentration of SARS-CoV-2 viral copies this assay can detect is 138 copies/mL. A negative result does not preclude SARS-Cov-2 infection and should not be used as the sole basis for treatment or other patient management decisions. A negative result may occur with  improper specimen collection/handling, submission of specimen other than nasopharyngeal swab, presence of viral mutation(s) within the areas targeted by this assay, and inadequate number of viral copies(<138 copies/mL). A negative result must be combined with clinical observations, patient history, and epidemiological information. The expected result is Negative.  Fact Sheet for Patients:  EntrepreneurPulse.com.au  Fact Sheet for Healthcare Providers:  IncredibleEmployment.be  This test is no t yet approved or cleared by the [place] FDA and  has been authorized for detection and/or diagnosis of SARS-CoV-2 by FDA under an Emergency Use Authorization (EUA). This EUA will remain  in effect (meaning this test can be used) for the duration of the COVID-19 declaration under Section 564(b)(1) of the Act, 21 U.S.C.section 360bbb-3(b)(1), unless the authorization is terminated  or revoked sooner.       Influenza A by PCR NEGATIVE NEGATIVE Final   Influenza B by  PCR NEGATIVE NEGATIVE Final    Comment: (NOTE) The Xpert Xpress SARS-CoV-2/FLU/RSV plus assay is intended as an aid in the diagnosis of influenza from Nasopharyngeal swab specimens and should not be used as a sole basis for treatment. Nasal washings and aspirates are unacceptable for Xpert Xpress SARS-CoV-2/FLU/RSV testing.  Fact Sheet for Patients: EntrepreneurPulse.com.au  Fact Sheet for Healthcare Providers: IncredibleEmployment.be  This test is not yet approved or cleared by the [place] FDA and has been authorized for detection and/or diagnosis of SARS-CoV-2 by FDA under an Emergency Use Authorization (EUA). This EUA will remain in effect (meaning this test can be used) for the duration of the COVID-19 declaration under Section 564(b)(1) of the Act, 21 U.S.C. section 360bbb-3(b)(1), unless the authorization is terminated or revoked.  Performed at [facility], [place] [address], [place], [place] [address]   Culture, blood (Routine x 2)     Status: Abnormal   Collection Time: 12/14/20  4:39 PM   Specimen: BLOOD  Result Value Ref Range Status   Specimen Description BLOOD RIGHT HAND  Final   Special Requests   Final    BOTTLES DRAWN AEROBIC AND ANAEROBIC Blood Culture results may not be optimal due to an inadequate volume of blood received in culture bottles   Culture  Setup Time   Final    GRAM POSITIVE COCCI IN CHAINS IN BOTH AEROBIC AND ANAEROBIC BOTTLES CRITICAL VALUE NOTED.  VALUE IS CONSISTENT WITH PREVIOUSLY REPORTED AND CALLED VALUE.    Culture (A)  Final    ENTEROCOCCUS FAECALIS SUSCEPTIBILITIES PERFORMED ON PREVIOUS CULTURE WITHIN THE LAST 5 DAYS. Performed at [facility], [place] [address] [place], [place] [address]    Report Status 12/17/2020 FINAL  Final  Urine Culture     Status: Abnormal   Collection Time: 12/14/20  4:43 PM   Specimen: Urine, Clean Catch  Result Value Ref Range Status   Specimen  Description URINE, CLEAN CATCH  Final   Special Requests   Final    NONE Performed at [facility], [place] [address], [place], [place] [address]    Culture >=100,000 COLONIES/mL STAPHYLOCOCCUS AUREUS (A)  Final   Report Status 12/17/2020 FINAL  Final   Organism ID, Bacteria STAPHYLOCOCCUS AUREUS (A)  Final      Susceptibility   Staphylococcus aureus - MIC*    CIPROFLOXACIN <=0.5 SENSITIVE Sensitive     GENTAMICIN <=0.5 SENSITIVE Sensitive     NITROFURANTOIN 32 SENSITIVE Sensitive     OXACILLIN <=0.25 SENSITIVE Sensitive     TETRACYCLINE <=1 SENSITIVE Sensitive     VANCOMYCIN <=0.5 SENSITIVE Sensitive     TRIMETH/SULFA <=10 SENSITIVE Sensitive     CLINDAMYCIN <=0.25 SENSITIVE Sensitive     RIFAMPIN <=0.5 SENSITIVE Sensitive     Inducible Clindamycin NEGATIVE Sensitive     * >=100,000 COLONIES/mL STAPHYLOCOCCUS AUREUS  Culture, blood (routine x 2)     Status: Abnormal   Collection Time: 12/15/20 11:20 AM   Specimen: BLOOD RIGHT ARM  Result Value Ref Range Status   Specimen Description BLOOD RIGHT ARM  Final   Special Requests   Final    BOTTLES DRAWN AEROBIC AND ANAEROBIC Blood Culture adequate volume   Culture  Setup Time   Final    GRAM POSITIVE COCCI ANAEROBIC BOTTLE ONLY CRITICAL VALUE NOTED.  VALUE IS CONSISTENT WITH PREVIOUSLY REPORTED AND CALLED VALUE.    Culture (A)  Final    ENTEROCOCCUS FAECALIS SUSCEPTIBILITIES PERFORMED ON PREVIOUS CULTURE WITHIN THE LAST 5 DAYS. Performed at [facility], [place] [address], [place], [place] [address]    Report Status 12/19/2020 FINAL  Final  Culture, blood (routine x 2)     Status: None   Collection Time: 12/15/20 11:25 AM   Specimen: BLOOD LEFT ARM  Result Value Ref Range Status   Specimen Description BLOOD LEFT ARM  Final   Special Requests   Final    BOTTLES DRAWN AEROBIC ONLY Blood Culture results may not be optimal due to an inadequate volume of blood received in culture bottles   Culture   Final    NO GROWTH 5  DAYS Performed at [facility], [place] [address], [place], [place] [address]    Report Status 12/20/2020 FINAL  Final  MRSA Next Gen by PCR, Nasal     Status: None   Collection Time: 12/16/20  4:34 AM   Specimen: Nasal Mucosa; Nasal Swab  Result Value Ref Range Status   MRSA by PCR Next Gen NOT DETECTED NOT DETECTED Final    Comment: (NOTE) The GeneXpert MRSA Assay (FDA approved for NASAL specimens only), is one component of a comprehensive MRSA colonization surveillance program. It is not intended to diagnose MRSA infection nor to guide or monitor treatment for MRSA infections. Test performance is not FDA approved in patients less than 59 years old. Performed at [facility], [place] [address], [place], [place] [address]   Culture, blood (routine x 2)     Status: Abnormal   Collection Time: 12/17/20  8:01 AM   Specimen: BLOOD RIGHT HAND  Result Value Ref Range Status   Specimen Description BLOOD RIGHT HAND  Final   Special Requests   Final    BOTTLES DRAWN AEROBIC AND ANAEROBIC Blood Culture adequate volume   Culture  Setup Time   Final    GRAM POSITIVE COCCI IN CHAINS ANAEROBIC BOTTLE ONLY CRITICAL VALUE NOTED.  VALUE IS CONSISTENT WITH PREVIOUSLY REPORTED AND CALLED VALUE.    Culture (A)  Final    ENTEROCOCCUS FAECALIS SUSCEPTIBILITIES PERFORMED ON PREVIOUS CULTURE WITHIN THE LAST 5 DAYS. Performed at [facility], [place]  [address] [place], [place] [address]    Report Status 12/20/2020 FINAL  Final  Culture, blood (routine x 2)     Status: None (Preliminary result)   Collection Time: 12/17/20  8:03 AM   Specimen: BLOOD RIGHT WRIST  Result Value Ref Range Status   Specimen Description BLOOD RIGHT WRIST  Final   Special Requests   Final    BOTTLES DRAWN AEROBIC AND ANAEROBIC Blood Culture adequate volume   Culture  Setup Time   Final    GRAM POSITIVE COCCI CRITICAL VALUE NOTED.  VALUE IS CONSISTENT WITH PREVIOUSLY REPORTED AND CALLED VALUE. Performed at  [facility], [place] [address] [place], [place] [address]    Culture GRAM POSITIVE COCCI  Final   Report Status PENDING  Incomplete  Culture, blood (routine x 2)     Status: None (Preliminary result)   Collection Time: 12/19/20  7:46 AM   Specimen: BLOOD  Result Value Ref Range Status   Specimen Description BLOOD RIGHT ANTECUBITAL  Final   Special Requests   Final    BOTTLES DRAWN AEROBIC AND ANAEROBIC Blood Culture adequate volume   Culture   Final    NO GROWTH 3 DAYS Performed at [facility], [place] [address], [place], [place] [address]    Report Status PENDING  Incomplete  Culture, blood (routine x 2)     Status: None (Preliminary result)   Collection Time: 12/19/20  7:47 AM   Specimen: BLOOD  Result Value Ref Range Status   Specimen Description BLOOD LEFT ANTECUBITAL  Final   Special Requests   Final    BOTTLES DRAWN AEROBIC AND ANAEROBIC Blood Culture adequate volume   Culture  Setup Time   Final    GRAM POSITIVE COCCI AEROBIC BOTTLE ONLY CRITICAL VALUE NOTED.  VALUE IS CONSISTENT WITH PREVIOUSLY REPORTED AND CALLED VALUE. Performed at [facility], [place] [address], [place], [place] [address]    Culture GRAM POSITIVE COCCI  Final   Report Status PENDING  Incomplete  Culture, blood (routine x 2)     Status: None (Preliminary result)   Collection Time: 12/21/20  6:32 PM   Specimen: BLOOD RIGHT WRIST  Result Value Ref Range Status   Specimen Description BLOOD RIGHT WRIST  Final   Special Requests   Final    BOTTLES DRAWN AEROBIC AND ANAEROBIC Blood Culture adequate volume   Culture   Final    NO GROWTH < 12 HOURS Performed at [facility], [place] [address], [place], [place] [address]    Report Status PENDING  Incomplete  Culture, blood (routine x 2)     Status: None (Preliminary result)   Collection Time: 12/21/20  6:40 PM   Specimen: BLOOD  Result Value Ref Range Status   Specimen Description BLOOD LEFT ANTECUBITAL  Final   Special Requests    Final    BOTTLES DRAWN AEROBIC AND ANAEROBIC Blood Culture adequate volume   Culture   Final    NO GROWTH < 12 HOURS Performed at [facility], [place] [address] [place],  [address]    Report Status PENDING  Incomplete    Studies/Results: No results found.   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  Will f/u  as needed.          Bobby Rumpf MD, FACP Infectious Diseases (pager) [phone] [email or web address] 12/22/2020, 9:49 AM  LOS: 7 days

## 2020-12-22 NOTE — Progress Notes (Addendum)
PROGRESS NOTE        PATIENT DETAILS Name: [name] [name] Age: 80 y.o. Sex: male Date of Birth: 1940-08-30 Admit Date: 12/14/2020 Admitting Physician Georgette Shell, MD JWJ:XBJYN, Arvid Right, MD  Brief Narrative: Patient is a 80 y.o. 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  See below for further details.    Subjective: Continues to be encephalopathic-awakes to painful stimuli-but really does not interact.   Objective: Vitals: Blood pressure 103/67, pulse 80, temperature 98.5 F (36.9 C), temperature source Axillary, resp. rate 19, height 5\' 7"  (1.702 m), weight 85.7 kg, SpO2 98 %.   Exam: Gen Exam: Lethargic-but not in any distress. HEENT:atraumatic, normocephalic Chest: B/L clear to auscultation anteriorly CVS:S1S2 regular Abdomen:soft non tender, non distended Extremities:no edema.  Right wrist seems to be somewhat tender with movement today. Neurology: Left-sided hemiplegia. Skin: no rash   Pertinent Labs/Radiology: Na: 132  K: 4.1 creatinine: 0.70  AST/ALT: 100/120 WBC: 10.0 Hb: 8.8  platelet: 193  uric acid: 2.0  10/10>>Blood culture: Enterococcus Enterococcus faecalis 10/10>> urine culture: Staff aureus 10/11>> blood culture: 1/2-Enterococcus faecalis 10/13>> blood culture: 1/2-Enterococcus faecalis 10/15>> blood culture: Staff epidermidis. 10/17>> blood culture: No growth  10/10>> CT head: Small right subdural hematoma 10/11>> TTE: EF-65-70%, no obvious  vegetation. 10/14>> TEE: Prosthetic mitral valve vegetation 10/14>> MRI brain: Acute infarct left cerebellar vermis, small subacute hematoma in the anterior right frontal lobe.  10/13-2010/14>> LTM EEG: No seizures.  Assessment/Plan: Acute metabolic encephalopathy: Due to hypernatremia/enterococcal bacteremia-continues to be persistently encephalopathic in spite of improvement in hypernatremia and continue treatment of enterococcal bacteremia.  No improvement after switching from Keppra to Vimpat and starting amantadine.  Continue supportive care-son is out of town in [place] will need to have further goals of care conversation when he gets back into town on Thursday.   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  Repeat blood culture on 10/17 negative so far.  Remains on Rocephin/ampicillin-ID following will defer further to infectious disease.      Right wrist tenderness: Unclear etiology-no evidence of erythema swelling on exam.  Uric acid levels unremarkable.  Awaiting x-rays.  Watch closely for now.  Hypernatremia: Resolved with IVF-currently with NG tube feedings.  PAF: Continue Tikosyn-remains on Eliquis.  Acute left cerebellar vermis CVA: Felt to be a septic emboli in the setting of mitral valve endocarditis.    History of seizure disorder: Initially on Keppra-given ongoing lethargy/encephalopathy-switch to Vimpat on 10/6.  LTM EEG negative for seizures.    Recent SDH May 2022: From signout from prior MD-Dr. 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   Continues to have significant left-sided hemiparesis.  On Eliquis.  History of rheumatic heart disease-s/p remote AVR/MVR (bioprosthetic valve)  Severe failure  to thrive syndrome/frailty/poor oral intake/goals of care: Continues to be lethargic-no significant oral intake over the past few days-cortak inserted on 10/14-on feeds.  DNR in place-family does not desire aggressive care-but if patient encephalopathy improves-starts interacting with family (even if bedbound )-patient's family not adverse to placing PEG tube (if oral intake continues to be poor) .  If patient does not improve-if family willing consider hospice measures at some point.  Plan is to watch/observe over the next few days-continue IV antibiotics/NG tube feedings-medication adjustments-and see if patient improves-family wishes to hold off on palliative care consultation/further goals of care discussion until patient's son is back into town this Thursday from [place].    Nutrition Status: Nutrition Problem: Severe Malnutrition Etiology: chronic illness (CHF, Stroke) Signs/Symptoms: percent weight loss, moderate fat depletion, severe muscle depletion Percent weight loss: 12 % Interventions: Tube feeding, Prostat, MVI    Procedures: None Consults: None DVT Prophylaxis: Eliquis Code Status:DNR Family Communication: [name] at bedside  Time spent: 35 minutes-Greater than 50% of this time was spent in counseling, explanation of diagnosis, planning of further management, and coordination of care.  Diet: Diet Order             DIET - DYS 1 Room service appropriate? Yes; Fluid consistency: Nectar Thick  Diet effective now                      Disposition Plan: Status is: Inpatient  Remains inpatient appropriate because:Inpatient level of care appropriate due to severity of illness  Dispo: The patient is from: SNF              Anticipated d/c is to: SNF              Patient currently is not medically stable to  d/c.   Difficult to place patient No    Barriers to Discharge: Persistent encephalopathy-persistent bacteremia with Enterococcus-on IV antibiotics-NG tube feedings ongoing due to poor oral intake.  Antimicrobial agents: Anti-infectives (From admission, onward)    Start     Dose/Rate Route Frequency Ordered Stop   12/18/20 1500  cefTRIAXone (ROCEPHIN) 2 g in sodium chloride 0.9 % 100 mL IVPB        2 g 200 mL/hr over 30 Minutes Intravenous Every 12 hours 12/18/20 1344     12/15/20 1700  cefTRIAXone (ROCEPHIN) 1 g in sodium chloride 0.9 % 100 mL IVPB  Status:  Discontinued        1 g 200 mL/hr over 30 Minutes Intravenous Every 24 hours 12/14/20 1648 12/15/20 0624   12/15/20 0630  ampicillin (OMNIPEN) 2 g in sodium chloride 0.9 % 100 mL IVPB        2 g 300 mL/hr over 20 Minutes Intravenous Every 6 hours 12/15/20 0624     12/15/20 0630  gentamicin (GARAMYCIN) IVPB 80 mg        80 mg 100 mL/hr over 30 Minutes Intravenous  Once 12/15/20 0628 12/15/20 0732   12/14/20 1645  cefTRIAXone (ROCEPHIN) 1 g in sodium chloride 0.9 % 100 mL IVPB        1 g 200 mL/hr over 30 Minutes Intravenous  Once 12/14/20 1642 12/14/20 1830        MEDICATIONS: Scheduled Meds:  amantadine  200 mg Per Tube BID   apixaban  5 mg Per Tube BID   atorvastatin  80 mg Per Tube Daily   dofetilide  500 mcg Oral BID   feeding supplement (PROSource TF)  45 mL  Per Tube Daily   finasteride  5 mg Oral Daily   free water  125 mL Per Tube Q4H   insulin aspart  0-9 Units Subcutaneous TID WC   lacosamide  50 mg Per Tube BID   magnesium gluconate  500 mg Per Tube Daily   mouth rinse  15 mL Mouth Rinse BID   multivitamin with minerals  1 tablet Per Tube Daily   sodium chloride flush  3 mL Intravenous Q12H   tamsulosin  0.4 mg Oral Daily   Continuous Infusions:  sodium chloride 10 mL/hr at 12/21/20 1204   sodium chloride     ampicillin (OMNIPEN) IV 2 g (12/22/20 2841)   cefTRIAXone (ROCEPHIN)  IV 2 g (12/22/20 0809)    feeding supplement (OSMOLITE 1.2 CAL) 75 mL/hr at 12/22/20 0618   magnesium sulfate bolus IVPB     PRN Meds:.sodium chloride, sodium chloride flush   I have personally reviewed following labs and imaging studies  LABORATORY DATA: CBC: Recent Labs  Lab 12/16/20 0130 12/18/20 0221 12/19/20 0610 12/20/20 0153 12/21/20 0151 12/22/20 0224  WBC 10.7* 9.1 10.1 9.1 9.9 10.0  NEUTROABS 7.7  --   --   --   --   --   HGB 9.9* 8.8* 8.8* 8.7* 8.8* 8.8*  HCT 30.7* 27.3* 27.6* 26.8* 26.6* 26.4*  MCV 88.0 88.1 87.9 87.9 86.9 86.3  PLT 106* 132* 176 191 192 193     Basic Metabolic Panel: Recent Labs  Lab 12/18/20 0221 12/18/20 1210 12/18/20 1630 12/19/20 0610 12/19/20 1632 12/20/20 0153 12/21/20 0151 12/22/20 0224  NA 143  --   --  139  --  137 135 132*  K 3.7  --   --  3.5  --  4.2 4.0 4.1  CL 114*  --   --  108  --  109 104 102  CO2 24  --   --  23  --  23 24 24   GLUCOSE 152*  --   --  186*  --  190* 171* 209*  BUN 18  --   --  16  --  15 15 15   CREATININE 0.78  --   --  0.76  --  0.69 0.65 0.70  CALCIUM 7.6*  --   --  7.7*  --  7.5* 7.5* 7.3*  MG 2.0 1.9 1.9 2.0 1.9 1.9  --  1.9  PHOS  --  2.8 3.1 2.7 2.4*  --   --   --      GFR: Estimated Creatinine Clearance: 79.5 mL/min (by C-G formula based on SCr of 0.7 mg/dL).  Liver Function Tests: Recent Labs  Lab 12/16/20 0130 12/19/20 0610 12/20/20 0153 12/21/20 0151 12/22/20 0224  AST 56* 64* 60* 72* 100*  ALT 76* 77* 72* 86* 120*  ALKPHOS 66 68 61 69 74  BILITOT 0.5 0.4 0.5 0.3 0.4  PROT 5.8* 5.7* 5.3* 5.5* 5.7*  ALBUMIN 1.9* 1.7* 1.6* 1.5* 1.5*    No results for input(s): LIPASE, AMYLASE in the last 168 hours. No results for input(s): AMMONIA in the last 168 hours.   Coagulation Profile: No results for input(s): INR, PROTIME in the last 168 hours.   Cardiac Enzymes: No results for input(s): CKTOTAL, CKMB, CKMBINDEX, TROPONINI in the last 168 hours.  BNP (last 3 results) No results for input(s):  PROBNP in the last 8760 hours.  Lipid Profile: No results for input(s): CHOL, HDL, LDLCALC, TRIG, CHOLHDL, LDLDIRECT in the last 72 hours.  Thyroid  Function Tests: No results for input(s): TSH, T4TOTAL, FREET4, T3FREE, THYROIDAB in the last 72 hours.   Anemia Panel: No results for input(s): VITAMINB12, FOLATE, FERRITIN, TIBC, IRON, RETICCTPCT in the last 72 hours.  Urine analysis:    Component Value Date/Time   COLORURINE YELLOW 12/14/2020 1600   APPEARANCEUR CLOUDY (A) 12/14/2020 1600   LABSPEC 1.020 12/14/2020 1600   PHURINE 6.0 12/14/2020 1600   GLUCOSEU NEGATIVE 12/14/2020 1600   HGBUR LARGE (A) 12/14/2020 1600   BILIRUBINUR NEGATIVE 12/14/2020 1600   BILIRUBINUR negative 11/22/2013 0848   KETONESUR NEGATIVE 12/14/2020 1600   PROTEINUR NEGATIVE 12/14/2020 1600   UROBILINOGEN 0.2 11/22/2013 0848   UROBILINOGEN 1.0 08/05/2013 2207   NITRITE POSITIVE (A) 12/14/2020 1600   LEUKOCYTESUR LARGE (A) 12/14/2020 1600    Sepsis Labs: Lactic Acid, Venous    Component Value Date/Time   LATICACIDVEN 1.8 12/14/2020 2056    MICROBIOLOGY: Recent Results (from the past 240 hour(s))  Culture, blood (Routine x 2)     Status: Abnormal   Collection Time: 12/14/20 12:45 PM   Specimen: BLOOD RIGHT FOREARM  Result Value Ref Range Status   Specimen Description BLOOD RIGHT FOREARM  Final   Special Requests   Final    BOTTLES DRAWN AEROBIC AND ANAEROBIC Blood Culture adequate volume   Culture  Setup Time   Final    GRAM POSITIVE COCCI IN BOTH AEROBIC AND ANAEROBIC BOTTLES CRITICAL RESULT CALLED TO, READ BACK BY AND VERIFIED WITH: PHARMD JAMES LEDFORD 12/15/2020@6 :07 BY TW Performed at [facility], [place] [address], [place], [place] [address]    Culture ENTEROCOCCUS FAECALIS (A)  Final   Report Status 12/17/2020 FINAL  Final   Organism ID, Bacteria ENTEROCOCCUS FAECALIS  Final      Susceptibility   Enterococcus faecalis - MIC*    AMPICILLIN <=2 SENSITIVE Sensitive      VANCOMYCIN 2 SENSITIVE Sensitive     GENTAMICIN SYNERGY SENSITIVE Sensitive     * ENTEROCOCCUS FAECALIS  Blood Culture ID Panel (Reflexed)     Status: Abnormal   Collection Time: 12/14/20 12:45 PM  Result Value Ref Range Status   Enterococcus faecalis DETECTED (A) NOT DETECTED Final    Comment: CRITICAL RESULT CALLED TO, READ BACK BY AND VERIFIED WITH: PHARMD JAMES LEDFORD 12/15/2020@6 :07 BY TW    Enterococcus Faecium NOT DETECTED NOT DETECTED Final   Listeria monocytogenes NOT DETECTED NOT DETECTED Final   Staphylococcus species NOT DETECTED NOT DETECTED Final   Staphylococcus aureus (BCID) NOT DETECTED NOT DETECTED Final   Staphylococcus epidermidis NOT DETECTED NOT DETECTED Final   Staphylococcus lugdunensis NOT DETECTED NOT DETECTED Final   Streptococcus species NOT DETECTED NOT DETECTED Final   Streptococcus agalactiae NOT DETECTED NOT DETECTED Final   Streptococcus pneumoniae NOT DETECTED NOT DETECTED Final   Streptococcus pyogenes NOT DETECTED NOT DETECTED Final   A.calcoaceticus-baumannii NOT DETECTED NOT DETECTED Final   Bacteroides fragilis NOT DETECTED NOT DETECTED Final   Enterobacterales NOT DETECTED NOT DETECTED Final   Enterobacter cloacae complex NOT DETECTED NOT DETECTED Final   Escherichia coli NOT DETECTED NOT DETECTED Final   Klebsiella aerogenes NOT DETECTED NOT DETECTED Final   Klebsiella oxytoca NOT DETECTED NOT DETECTED Final   Klebsiella pneumoniae NOT DETECTED NOT DETECTED Final   Proteus species NOT DETECTED NOT DETECTED Final   Salmonella species NOT DETECTED NOT DETECTED Final   Serratia marcescens NOT DETECTED NOT DETECTED Final   Haemophilus influenzae NOT DETECTED NOT DETECTED Final   Neisseria meningitidis NOT DETECTED  NOT DETECTED Final   Pseudomonas aeruginosa NOT DETECTED NOT DETECTED Final   Stenotrophomonas maltophilia NOT DETECTED NOT DETECTED Final   Candida albicans NOT DETECTED NOT DETECTED Final   Candida auris NOT DETECTED NOT DETECTED  Final   Candida glabrata NOT DETECTED NOT DETECTED Final   Candida krusei NOT DETECTED NOT DETECTED Final   Candida parapsilosis NOT DETECTED NOT DETECTED Final   Candida tropicalis NOT DETECTED NOT DETECTED Final   Cryptococcus neoformans/gattii NOT DETECTED NOT DETECTED Final   Vancomycin resistance NOT DETECTED NOT DETECTED Final    Comment: Performed at [facility], [place] [address] [place], [place] [address]  Resp Panel by RT-PCR (Flu A&B, Covid) Nasopharyngeal Swab     Status: None   Collection Time: 12/14/20  3:41 PM   Specimen: Nasopharyngeal Swab; Nasopharyngeal(NP) swabs in vial transport medium  Result Value Ref Range Status   SARS Coronavirus 2 by RT PCR NEGATIVE NEGATIVE Final    Comment: (NOTE) SARS-CoV-2 target nucleic acids are NOT DETECTED.  The SARS-CoV-2 RNA is generally detectable in upper respiratory specimens during the acute phase of infection. The lowest concentration of SARS-CoV-2 viral copies this assay can detect is 138 copies/mL. A negative result does not preclude SARS-Cov-2 infection and should not be used as the sole basis for treatment or other patient management decisions. A negative result may occur with  improper specimen collection/handling, submission of specimen other than nasopharyngeal swab, presence of viral mutation(s) within the areas targeted by this assay, and inadequate number of viral copies(<138 copies/mL). A negative result must be combined with clinical observations, patient history, and epidemiological information. The expected result is Negative.  Fact Sheet for Patients:  EntrepreneurPulse.com.au  Fact Sheet for Healthcare Providers:  IncredibleEmployment.be  This test is no t yet approved or cleared by the [place] FDA and  has been authorized for detection and/or diagnosis of SARS-CoV-2 by FDA under an Emergency Use Authorization (EUA). This EUA will remain  in effect (meaning  this test can be used) for the duration of the COVID-19 declaration under Section 564(b)(1) of the Act, 21 U.S.C.section 360bbb-3(b)(1), unless the authorization is terminated  or revoked sooner.       Influenza A by PCR NEGATIVE NEGATIVE Final   Influenza B by PCR NEGATIVE NEGATIVE Final    Comment: (NOTE) The Xpert Xpress SARS-CoV-2/FLU/RSV plus assay is intended as an aid in the diagnosis of influenza from Nasopharyngeal swab specimens and should not be used as a sole basis for treatment. Nasal washings and aspirates are unacceptable for Xpert Xpress SARS-CoV-2/FLU/RSV testing.  Fact Sheet for Patients: EntrepreneurPulse.com.au  Fact Sheet for Healthcare Providers: IncredibleEmployment.be  This test is not yet approved or cleared by the [place] FDA and has been authorized for detection and/or diagnosis of SARS-CoV-2 by FDA under an Emergency Use Authorization (EUA). This EUA will remain in effect (meaning this test can be used) for the duration of the COVID-19 declaration under Section 564(b)(1) of the Act, 21 U.S.C. section 360bbb-3(b)(1), unless the authorization is terminated or revoked.  Performed at [facility], [place] [address], [place],  [address]   Culture, blood (Routine x 2)     Status: Abnormal   Collection Time: 12/14/20  4:39 PM   Specimen: BLOOD  Result Value Ref Range Status   Specimen Description BLOOD RIGHT HAND  Final   Special Requests   Final    BOTTLES DRAWN AEROBIC AND ANAEROBIC Blood Culture results may not be optimal due to  an inadequate volume of blood received in culture bottles   Culture  Setup Time   Final    GRAM POSITIVE COCCI IN CHAINS IN BOTH AEROBIC AND ANAEROBIC BOTTLES CRITICAL VALUE NOTED.  VALUE IS CONSISTENT WITH PREVIOUSLY REPORTED AND CALLED VALUE.    Culture (A)  Final    ENTEROCOCCUS FAECALIS SUSCEPTIBILITIES PERFORMED ON PREVIOUS CULTURE WITHIN THE LAST 5 DAYS. Performed  at [facility], [place] [address], [place], [place] [address]    Report Status 12/17/2020 FINAL  Final  Urine Culture     Status: Abnormal   Collection Time: 12/14/20  4:43 PM   Specimen: Urine, Clean Catch  Result Value Ref Range Status   Specimen Description URINE, CLEAN CATCH  Final   Special Requests   Final    NONE Performed at [facility], [place] [address] [place], [place] [address]    Culture >=100,000 COLONIES/mL STAPHYLOCOCCUS AUREUS (A)  Final   Report Status 12/17/2020 FINAL  Final   Organism ID, Bacteria STAPHYLOCOCCUS AUREUS (A)  Final      Susceptibility   Staphylococcus aureus - MIC*    CIPROFLOXACIN <=0.5 SENSITIVE Sensitive     GENTAMICIN <=0.5 SENSITIVE Sensitive     NITROFURANTOIN 32 SENSITIVE Sensitive     OXACILLIN <=0.25 SENSITIVE Sensitive     TETRACYCLINE <=1 SENSITIVE Sensitive     VANCOMYCIN <=0.5 SENSITIVE Sensitive     TRIMETH/SULFA <=10 SENSITIVE Sensitive     CLINDAMYCIN <=0.25 SENSITIVE Sensitive     RIFAMPIN <=0.5 SENSITIVE Sensitive     Inducible Clindamycin NEGATIVE Sensitive     * >=100,000 COLONIES/mL STAPHYLOCOCCUS AUREUS  Culture, blood (routine x 2)     Status: Abnormal   Collection Time: 12/15/20 11:20 AM   Specimen: BLOOD RIGHT ARM  Result Value Ref Range Status   Specimen Description BLOOD RIGHT ARM  Final   Special Requests   Final    BOTTLES DRAWN AEROBIC AND ANAEROBIC Blood Culture adequate volume   Culture  Setup Time   Final    GRAM POSITIVE COCCI ANAEROBIC BOTTLE ONLY CRITICAL VALUE NOTED.  VALUE IS CONSISTENT WITH PREVIOUSLY REPORTED AND CALLED VALUE.    Culture (A)  Final    ENTEROCOCCUS FAECALIS SUSCEPTIBILITIES PERFORMED ON PREVIOUS CULTURE WITHIN THE LAST 5 DAYS. Performed at [facility], [place] [address], [place], [place] [address]    Report Status 12/19/2020 FINAL  Final  Culture, blood (routine x 2)     Status: None   Collection Time: 12/15/20 11:25 AM   Specimen: BLOOD LEFT ARM  Result Value Ref  Range Status   Specimen Description BLOOD LEFT ARM  Final   Special Requests   Final    BOTTLES DRAWN AEROBIC ONLY Blood Culture results may not be optimal due to an inadequate volume of blood received in culture bottles   Culture   Final    NO GROWTH 5 DAYS Performed at [facility], [place] [address], [place], [place] [address]    Report Status 12/20/2020 FINAL  Final  MRSA Next Gen by PCR, Nasal     Status: None   Collection Time: 12/16/20  4:34 AM   Specimen: Nasal Mucosa; Nasal Swab  Result Value Ref Range Status   MRSA by PCR Next Gen NOT DETECTED NOT DETECTED Final    Comment: (NOTE) The GeneXpert MRSA Assay (FDA approved for NASAL specimens only), is one component of a comprehensive MRSA colonization surveillance program. It is not intended to diagnose  MRSA infection nor to guide or monitor treatment for MRSA infections. Test performance is not FDA approved in patients less than 18 years old. Performed at [facility], [place] [address] [place], [place] [address]   Culture, blood (routine x 2)     Status: Abnormal   Collection Time: 12/17/20  8:01 AM   Specimen: BLOOD RIGHT HAND  Result Value Ref Range Status   Specimen Description BLOOD RIGHT HAND  Final   Special Requests   Final    BOTTLES DRAWN AEROBIC AND ANAEROBIC Blood Culture adequate volume   Culture  Setup Time   Final    GRAM POSITIVE COCCI IN CHAINS ANAEROBIC BOTTLE ONLY CRITICAL VALUE NOTED.  VALUE IS CONSISTENT WITH PREVIOUSLY REPORTED AND CALLED VALUE.    Culture (A)  Final    ENTEROCOCCUS FAECALIS SUSCEPTIBILITIES PERFORMED ON PREVIOUS CULTURE WITHIN THE LAST 5 DAYS. Performed at [facility], [place] [address] [place], [place] [address]    Report Status 12/20/2020 FINAL  Final  Culture, blood (routine x 2)     Status: Abnormal (Preliminary result)   Collection Time: 12/17/20  8:03 AM   Specimen: BLOOD RIGHT WRIST  Result Value Ref Range Status   Specimen Description BLOOD RIGHT WRIST   Final   Special Requests   Final    BOTTLES DRAWN AEROBIC AND ANAEROBIC Blood Culture adequate volume   Culture  Setup Time   Final    GRAM POSITIVE COCCI ANAEROBIC BOTTLE ONLY CRITICAL VALUE NOTED.  VALUE IS CONSISTENT WITH PREVIOUSLY REPORTED AND CALLED VALUE.    Culture (A)  Final    ENTEROCOCCUS FAECALIS SUSCEPTIBILITIES PERFORMED ON PREVIOUS CULTURE WITHIN THE LAST 5 DAYS. Performed at [facility], [place] [address], [place], [place] [address]    Report Status PENDING  Incomplete  Culture, blood (routine x 2)     Status: None (Preliminary result)   Collection Time: 12/19/20  7:46 AM   Specimen: BLOOD  Result Value Ref Range Status   Specimen Description BLOOD RIGHT ANTECUBITAL  Final   Special Requests   Final    BOTTLES DRAWN AEROBIC AND ANAEROBIC Blood Culture adequate volume   Culture   Final    NO GROWTH 3 DAYS Performed at [facility], [place] [address] [place], [place] [address]    Report Status PENDING  Incomplete  Culture, blood (routine x 2)     Status: Abnormal (Preliminary result)   Collection Time: 12/19/20  7:47 AM   Specimen: BLOOD  Result Value Ref Range Status   Specimen Description BLOOD LEFT ANTECUBITAL  Final   Special Requests   Final    BOTTLES DRAWN AEROBIC AND ANAEROBIC Blood Culture adequate volume   Culture  Setup Time   Final    GRAM POSITIVE COCCI AEROBIC BOTTLE ONLY CRITICAL VALUE NOTED.  VALUE IS CONSISTENT WITH PREVIOUSLY REPORTED AND CALLED VALUE.    Culture (A)  Final    STAPHYLOCOCCUS EPIDERMIDIS THE SIGNIFICANCE OF ISOLATING THIS ORGANISM FROM A SINGLE SET OF BLOOD CULTURES WHEN MULTIPLE SETS ARE DRAWN IS UNCERTAIN. PLEASE NOTIFY THE MICROBIOLOGY DEPARTMENT WITHIN ONE WEEK IF SPECIATION AND SENSITIVITIES ARE REQUIRED. Performed at [facility], [place] [address], [place], [place] [address]    Report Status PENDING  Incomplete  Culture, blood (routine x 2)     Status: None (Preliminary result)   Collection Time: 12/21/20   6:32 PM   Specimen: BLOOD RIGHT WRIST  Result Value Ref Range Status  Specimen Description BLOOD RIGHT WRIST  Final   Special Requests   Final    BOTTLES DRAWN AEROBIC AND ANAEROBIC Blood Culture adequate volume   Culture   Final    NO GROWTH < 12 HOURS Performed at [facility], [address], [place], [place] [address]    Report Status PENDING  Incomplete  Culture, blood (routine x 2)     Status: None (Preliminary result)   Collection Time: 12/21/20  6:40 PM   Specimen: BLOOD  Result Value Ref Range Status   Specimen Description BLOOD LEFT ANTECUBITAL  Final   Special Requests   Final    BOTTLES DRAWN AEROBIC AND ANAEROBIC Blood Culture adequate volume   Culture   Final    NO GROWTH < 12 HOURS Performed at [facility], [place] [address] [place], [place] [address]    Report Status PENDING  Incomplete    RADIOLOGY STUDIES/RESULTS: No results found.   LOS: 7 days   Oren Binet, MD  [facility]    To contact the attending provider between 7A-7P or the covering provider during after hours 7P-7A, please log into the web site [email or web address] and access using universal [place] password for that web site. If you do not have the password, please call the hospital operator.  12/22/2020, 12:54 PM

## 2020-12-22 NOTE — Progress Notes (Signed)
Pharmacy: Dofetilide (Tikosyn) - Follow Up Assessment and Electrolyte Replacement  Pharmacy consulted to assist in monitoring and replacing electrolytes in this 80 y.o. male admitted on 12/14/2020 undergoing dofetilide re-initiation.   Labs:    Component Value Date/Time   K 4.1 12/22/2020 0224   K 4.1 11/26/2013 0000   MG 1.9 12/22/2020 0224   MG 2.6 12/10/2020 0000     Plan: Potassium: K 4.1 - no supplementation needed  Magnesium: Mg 1.9 - will give 2g to keep above 2.   Qtc prolonged yesterday at 519, rechecked today it is still 518. Discussed with cardiology will decrease dose to 343mcg bid and follow up repeat ECGs.   Thank you for allowing pharmacy to participate in this patient's care   Erin Hearing PharmD., BCPS Clinical Pharmacist 12/22/2020 1:09 PM

## 2020-12-22 NOTE — Progress Notes (Signed)
Patient ID: [name] [name], male   DOB: December 28, 1940, 80 y.o.   MRN: [id number]     Advanced Heart Failure Rounding Note  PCP-Cardiologist: Loralie Champagne, MD   Subjective:    Sleeping this morning, per notes he has remained encephalopathic.  No family present.   Blood cultures from 10/15 now noted to be positive for Enterococcus.  Cultures sent again on 10/17.  Afebrile.   Cortrak in place, getting tube feeds.   TEE: EF 60-65%, RV probably normal, bioprosthetic AVR mean gradient 14 with trivial AI, bioprosthetic MV with trivial MR, mean gradient 8, vegetation on the sewing ring and also on the leaflets (relatively small size).     Objective:   Weight Range: 85.7 kg Body mass index is 29.59 kg/m.   Vital Signs:   Temp:  [98 F (36.7 C)-99.1 F (37.3 C)] 98.5 F (36.9 C) (10/18 0734) Pulse Rate:  [75-87] 78 (10/18 0734) Resp:  [20-26] 22 (10/18 0734) BP: (98-111)/(66-72) 108/66 (10/18 0734) SpO2:  [92 %-98 %] 94 % (10/18 0734) Weight:  [85.7 kg] 85.7 kg (10/18 0431) Last BM Date: 12/21/20  Weight change: Filed Weights   12/19/20 0343 12/21/20 0456 12/22/20 0431  Weight: 82.6 kg 83.9 kg 85.7 kg    Intake/Output:   Intake/Output Summary (Last 24 hours) at 12/22/2020 0834 Last data filed at 12/22/2020 0618 Gross per 24 hour  Intake 2595 ml  Output 2100 ml  Net 495 ml      Physical Exam    General: NAD Neck: No JVD, no thyromegaly or thyroid nodule.  Lungs: Clear to auscultation bilaterally with normal respiratory effort. CV: Nondisplaced PMI.  Heart regular S1/S2, no S3/S4, 2/6 SEM RUSB  No peripheral edema.  No carotid bruit.  Normal pedal pulses.  Abdomen: Soft, nontender, no hepatosplenomegaly, no distention.  Skin: Intact without lesions or rashes.  Neurologic: Does not open eyes.  Extremities: No clubbing or cyanosis.  HEENT: Normal.    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 Personally reviewed.   Labs    CBC Recent Labs    12/21/20 0151  12/22/20 0224  WBC 9.9 10.0  HGB 8.8* 8.8*  HCT 26.6* 26.4*  MCV 86.9 86.3  PLT 192 782   Basic Metabolic Panel Recent Labs    12/19/20 1632 12/19/20 1632 12/20/20 0153 12/21/20 0151 12/22/20 0224  NA  --    < > 137 135 132*  K  --    < > 4.2 4.0 4.1  CL  --    < > 109 104 102  CO2  --    < > 23 24 24   GLUCOSE  --    < > 190* 171* 209*  BUN  --    < > 15 15 15   CREATININE  --    < > 0.69 0.65 0.70  CALCIUM  --    < > 7.5* 7.5* 7.3*  MG 1.9  --  1.9  --  1.9  PHOS 2.4*  --   --   --   --    < > = values in this interval not displayed.   Liver Function Tests Recent Labs    12/21/20 0151 12/22/20 0224  AST 72* 100*  ALT 86* 120*  ALKPHOS 69 74  BILITOT 0.3 0.4  PROT 5.5* 5.7*  ALBUMIN 1.5* 1.5*   No results for input(s): LIPASE, AMYLASE in the last 72 hours. Cardiac Enzymes No results for input(s): CKTOTAL, CKMB, CKMBINDEX, TROPONINI in the last  72 hours.  BNP: BNP (last 3 results) No results for input(s): BNP in the last 8760 hours.  ProBNP (last 3 results) No results for input(s): PROBNP in the last 8760 hours.   D-Dimer No results for input(s): DDIMER in the last 72 hours. Hemoglobin A1C No results for input(s): HGBA1C in the last 72 hours.  Fasting Lipid Panel No results for input(s): CHOL, HDL, LDLCALC, TRIG, CHOLHDL, LDLDIRECT in the last 72 hours. Thyroid Function Tests No results for input(s): TSH, T4TOTAL, T3FREE, THYROIDAB in the last 72 hours.  Invalid input(s): FREET3  Other results:   Imaging    No results found.   Medications:     Scheduled Medications:  amantadine  200 mg Per Tube BID   apixaban  5 mg Per Tube BID   atorvastatin  80 mg Per Tube Daily   dofetilide  500 mcg Oral BID   feeding supplement (PROSource TF)  45 mL Per Tube Daily   finasteride  5 mg Oral Daily   free water  125 mL Per Tube Q4H   insulin aspart  0-9 Units Subcutaneous TID WC   lacosamide  50 mg Per Tube BID   magnesium gluconate  500 mg Per Tube  Daily   mouth rinse  15 mL Mouth Rinse BID   multivitamin with minerals  1 tablet Per Tube Daily   sodium chloride flush  3 mL Intravenous Q12H   tamsulosin  0.4 mg Oral Daily    Infusions:  sodium chloride 10 mL/hr at 12/21/20 1204   sodium chloride     ampicillin (OMNIPEN) IV 2 g (12/22/20 1062)   cefTRIAXone (ROCEPHIN)  IV 2 g (12/22/20 0809)   feeding supplement (OSMOLITE 1.2 CAL) 75 mL/hr at 12/22/20 0618    PRN Medications: sodium chloride, sodium chloride flush   Assessment/Plan   1. 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  Baseline, he is bed-bound and per wife communicates minimally, left hemiparesis.  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  Does not open eyes for me and no family at bedside, per notes has remained encephalopathic.  2. Hypernatremia: Resolved, Na 132 today.  3. Enterococcal bacteremia: Source uncertain.  No PNA noted on CXR.  Enterococcus did not grow in urine.  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  He has a new left cerebellar CVA (small) that may be embolic from MV vegetation.  Blood cultures from 10/10, 10/11, 10/13 and 10/15 were positive.  Resent 10/17. Afebrile, WBCs 10.  Failure to clear cultures is a sign of poor prognosis with bioprosthetic valve endocarditis.  - Continues on ceftriaxone and ampicillin.  - ID following.  - Not candidate for cardiac surgery, management will have to be medical.  4. 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  Echo this admission with EF 65-70%, normal RV, bioprosthetic AVR mean gradient 16 mmHg appears normal, bioprosthetic  MV mean gradient 10, increased from prior but HR also in 100s when study done.  TEE as above showed bioprosthetic MV vegetation with trivial MR and mean gradient 8.  5. Atrial fibrillation/Flutter: Maze in 2006 + 2 flutter/fib ablations at [facility]. Remains in NSR.  - Continue  Tikosyn, QTc ok on initial ECG.  - Continue Eliquis.  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  6. Malnutrition: Albumin 1.5.  TFs via Cortrak.   Patient still has not regained baseline mental status and has persistently positive blood cultures in setting of bioprosthetic valve endocarditis (not surgical candidate).  Poor prognosis, would consider hospice/comfort care at this point.   Length of Stay: 7  Loralie Champagne, MD  12/22/2020, 8:34 AM  Advanced Heart Failure Team Pager [phone] (M-F; 7a - 5p)  Please contact [facility] for night-coverage after hours (5p -7a ) and weekends on [email or web address]

## 2020-12-23 DIAGNOSIS — R7881 Bacteremia: Secondary | ICD-10-CM | POA: Diagnosis not present

## 2020-12-23 DIAGNOSIS — E43 Unspecified severe protein-calorie malnutrition: Secondary | ICD-10-CM | POA: Diagnosis not present

## 2020-12-23 DIAGNOSIS — Z9889 Other specified postprocedural states: Secondary | ICD-10-CM | POA: Diagnosis not present

## 2020-12-23 DIAGNOSIS — Z8673 Personal history of transient ischemic attack (TIA), and cerebral infarction without residual deficits: Secondary | ICD-10-CM | POA: Diagnosis not present

## 2020-12-23 DIAGNOSIS — I38 Endocarditis, valve unspecified: Secondary | ICD-10-CM | POA: Diagnosis not present

## 2020-12-23 DIAGNOSIS — T826XXD Infection and inflammatory reaction due to cardiac valve prosthesis, subsequent encounter: Secondary | ICD-10-CM | POA: Diagnosis not present

## 2020-12-23 LAB — MAGNESIUM: Magnesium: 2.3 mg/dL (ref 1.7–2.4)

## 2020-12-23 LAB — CBC
HCT: 25.9 % — ABNORMAL LOW (ref 39.0–52.0)
Hemoglobin: 8.4 g/dL — ABNORMAL LOW (ref 13.0–17.0)
MCH: 28.3 pg (ref 26.0–34.0)
MCHC: 32.4 g/dL (ref 30.0–36.0)
MCV: 87.2 fL (ref 80.0–100.0)
Platelets: 201 10*3/uL (ref 150–400)
RBC: 2.97 MIL/uL — ABNORMAL LOW (ref 4.22–5.81)
RDW: 15.9 % — ABNORMAL HIGH (ref 11.5–15.5)
WBC: 9.4 10*3/uL (ref 4.0–10.5)
nRBC: 0 % (ref 0.0–0.2)

## 2020-12-23 LAB — CULTURE, BLOOD (ROUTINE X 2)
Special Requests: ADEQUATE
Special Requests: ADEQUATE

## 2020-12-23 LAB — COMPREHENSIVE METABOLIC PANEL
ALT: 139 U/L — ABNORMAL HIGH (ref 0–44)
AST: 111 U/L — ABNORMAL HIGH (ref 15–41)
Albumin: <1.5 g/dL — ABNORMAL LOW (ref 3.5–5.0)
Alkaline Phosphatase: 79 U/L (ref 38–126)
Anion gap: 6 (ref 5–15)
BUN: 15 mg/dL (ref 8–23)
CO2: 23 mmol/L (ref 22–32)
Calcium: 7.1 mg/dL — ABNORMAL LOW (ref 8.9–10.3)
Chloride: 98 mmol/L (ref 98–111)
Creatinine, Ser: 0.74 mg/dL (ref 0.61–1.24)
GFR, Estimated: >60 mL/min (ref >60)
Glucose, Bld: 195 mg/dL — ABNORMAL HIGH (ref 70–99)
Potassium: 3.9 mmol/L (ref 3.5–5.1)
Sodium: 127 mmol/L — ABNORMAL LOW (ref 135–145)
Total Bilirubin: 0.4 mg/dL (ref 0.3–1.2)
Total Protein: 5.6 g/dL — ABNORMAL LOW (ref 6.5–8.1)

## 2020-12-23 LAB — GLUCOSE, CAPILLARY
Glucose-Capillary: 158 mg/dL — ABNORMAL HIGH (ref 70–99)
Glucose-Capillary: 175 mg/dL — ABNORMAL HIGH (ref 70–99)
Glucose-Capillary: 183 mg/dL — ABNORMAL HIGH (ref 70–99)
Glucose-Capillary: 198 mg/dL — ABNORMAL HIGH (ref 70–99)
Glucose-Capillary: 199 mg/dL — ABNORMAL HIGH (ref 70–99)
Glucose-Capillary: 205 mg/dL — ABNORMAL HIGH (ref 70–99)
Glucose-Capillary: 207 mg/dL — ABNORMAL HIGH (ref 70–99)

## 2020-12-23 MED ORDER — POTASSIUM CHLORIDE 20 MEQ PO PACK
40.0000 meq | PACK | Freq: Once | ORAL | Status: AC
  Administered 2020-12-23: 40 meq
  Filled 2020-12-23: qty 2

## 2020-12-23 MED ORDER — INSULIN ASPART 100 UNIT/ML IJ SOLN
2.0000 [IU] | Freq: Once | INTRAMUSCULAR | Status: AC
  Administered 2020-12-23: 2 [IU] via SUBCUTANEOUS

## 2020-12-23 MED ORDER — POTASSIUM CHLORIDE CRYS ER 20 MEQ PO TBCR: 40.0000 meq | EXTENDED_RELEASE_TABLET | Freq: Once | ORAL | Status: DC

## 2020-12-23 MED ORDER — FUROSEMIDE 10 MG/ML IJ SOLN
20.0000 mg | Freq: Once | INTRAMUSCULAR | Status: AC
  Administered 2020-12-23: 20 mg via INTRAVENOUS
  Filled 2020-12-23: qty 2

## 2020-12-23 MED ORDER — FREE WATER
125.0000 mL | Status: DC
  Administered 2020-12-23 – 2020-12-24 (×4): 125 mL

## 2020-12-23 NOTE — Progress Notes (Signed)
PROGRESS NOTE        PATIENT DETAILS Name: [name] [name] Age: 80 y.o. Sex: male Date of Birth: 1940/09/21 Admit Date: 12/14/2020 Admitting Physician Georgette Shell, MD ESP:[id number], Arvid Right, MD  Brief Narrative: Patient is a 80 y.o. 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  See below for further details.    Subjective: A bit more awake-but still does not interact.  Seems to have less pain in his right wrist today-as he is not flinching when moved.   Objective: Vitals: Blood pressure 110/68, pulse 77, temperature 98.1 F (36.7 C), temperature source Axillary, resp. rate (!) 21, height 5\' 7"  (1.702 m), weight 83.4 kg, SpO2 96 %.   Exam: Gen Exam: A bit more awake compared to yesterday-but still not interactive.  Not following commands. HEENT:atraumatic, normocephalic Chest: B/L clear to auscultation anteriorly CVS:S1S2 regular Abdomen:soft non tender, non distended Extremities:no edema Neurology: Left-sided hemiplegia.   Skin: no rash   Pertinent Labs/Radiology: Na: 127 K: 3.9  creatinine: 0.74 AST/ALT: 111/139  WBC: 9.4 Hb: 8.4 platelet: 201 uric acid: 2.0  10/10>>Blood culture: Enterococcus Enterococcus faecalis 10/10>> urine culture: Staff aureus 10/11>> blood culture: 1/2-Enterococcus faecalis 10/13>> blood culture: 1/2-Enterococcus faecalis 10/15>> blood culture: Staff epidermidis. 10/17>> blood culture: No growth  10/10>> CT head: Small right subdural  hematoma 10/11>> TTE: EF-65-70%, no obvious vegetation. 10/14>> TEE: Prosthetic mitral valve vegetation 10/14>> MRI brain: Acute infarct left cerebellar vermis, small subacute hematoma in the anterior right frontal lobe.  10/13-2010/14>> LTM EEG: No seizures.  Assessment/Plan: Acute metabolic encephalopathy: Due to hypernatremia/enterococcal bacteremia-patient somewhat more awake today-but really not interactive.  Continue to treat underlying endocarditis with IV ampicillin/Rocephin-hypernatremia has resolved.  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  Repeat blood culture on 10/17 negative so far.  Remains on Rocephin/ampicillin-ID following will defer further to infectious disease.      Right wrist tenderness: Unclear etiology-no evidence of erythema swelling on exam.  Uric acid levels unremarkable.  X-ray suggestive of arthritis-seems to be much better today-continue Voltaren gel.  Watch and follow.  Hypernatremia: Resolved with IVF-currently with NG tube feedings  Hyponatremia: Probably due to free water/NG tube feeds-stop free water-1 dose of IV Lasix-and recheck electrolytes tomorrow.  Transaminitis: Suspect this is multifactorial from statin/Rocephin/underlying bacteremia.  Abdomen is soft.  Stop statin today-and follow trend.  If LFTs worsens significantly-can consider further work-up  PAF: Continue Tikosyn-remains on Eliquis.  Acute left cerebellar vermis CVA: Felt to be a septic emboli in the setting of mitral valve endocarditis.    History of seizure disorder: Initially on Keppra-given ongoing lethargy/encephalopathy-switched to Vimpat on 10/6.  LTM EEG negative for seizures.    Recent SDH May 2022: From signout from prior MD-Dr. 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  Continues to have significant left-sided hemiparesis.  On Eliquis.  History of rheumatic heart disease-s/p remote AVR/MVR (bioprosthetic valve)  Severe failure to thrive syndrome/frailty/poor oral intake/goals of care: Continues to be lethargic-no significant oral intake over the past few days-cortak inserted on 10/14-on feeds.  DNR in place-family does not desire aggressive care-but if patient encephalopathy improves-starts interacting with family (even if bedbound )-patient's family not adverse to placing PEG tube (if oral intake continues to be poor) .  If patient does not improve-if family willing consider hospice measures at some point.  Plan is to watch/observe over the next few days-continue IV antibiotics/NG tube feedings-medication adjustments-and see if patient improves-family wishes to hold off on palliative care consultation/further goals of care discussion until patient's son is back into town this Thursday from [place].  Family meeting set for 9 AM tomorrow.  Nutrition Status: Nutrition Problem: Severe Malnutrition Etiology: chronic illness (CHF, Stroke) Signs/Symptoms: percent weight loss, moderate fat depletion, severe muscle depletion Percent weight loss: 12 % Interventions: Tube feeding, Prostat, MVI    Procedures: None Consults: None DVT Prophylaxis: Eliquis Code Status:DNR Family Communication: [name] at bedside  Time spent: 35 minutes-Greater than 50% of this time was spent in counseling, explanation of diagnosis, planning of further management, and coordination of care.  Diet: Diet Order             DIET - DYS 1 Room service appropriate? Yes; Fluid consistency: Nectar Thick  Diet effective now                       Disposition Plan: Status is: Inpatient  Remains inpatient appropriate because:Inpatient level of care appropriate due to severity of illness  Dispo: The patient is from: SNF              Anticipated d/c is to: SNF              Patient currently is not medically stable to d/c.   Difficult to place patient No    Barriers to Discharge: Persistent encephalopathy-persistent bacteremia with Enterococcus-on IV antibiotics-NG tube feedings ongoing due to poor oral intake.  Antimicrobial agents: Anti-infectives (From admission, onward)    Start     Dose/Rate Route Frequency Ordered Stop   12/18/20 1500  cefTRIAXone (ROCEPHIN) 2 g in sodium chloride 0.9 % 100 mL IVPB        2 g 200 mL/hr over 30 Minutes Intravenous Every 12 hours 12/18/20 1344     12/15/20 1700  cefTRIAXone (ROCEPHIN) 1 g in sodium chloride 0.9 % 100 mL IVPB  Status:  Discontinued        1 g 200 mL/hr over 30 Minutes Intravenous Every 24 hours 12/14/20 1648 12/15/20 0624   12/15/20 0630  ampicillin (OMNIPEN) 2 g in sodium chloride 0.9 % 100 mL IVPB        2 g 300 mL/hr over 20 Minutes Intravenous Every 6 hours 12/15/20 0624     12/15/20 0630  gentamicin (GARAMYCIN) IVPB 80 mg        80 mg 100 mL/hr over 30 Minutes Intravenous  Once 12/15/20 0628 12/15/20 0732   12/14/20 1645  cefTRIAXone (ROCEPHIN) 1 g in sodium chloride 0.9 % 100 mL IVPB        1 g 200 mL/hr over 30 Minutes Intravenous  Once 12/14/20 1642 12/14/20 1830        MEDICATIONS: Scheduled Meds:  amantadine  200 mg Per  Tube BID   apixaban  5 mg Per Tube BID   diclofenac Sodium  4 g Topical QID   dofetilide  375 mcg Oral BID   feeding supplement (PROSource TF)  45 mL Per Tube Daily   finasteride  5 mg Oral Daily   insulin aspart  0-9 Units Subcutaneous TID WC   lacosamide  50 mg Per Tube BID   magnesium gluconate  500 mg Per Tube Daily   mouth rinse  15 mL Mouth Rinse BID   multivitamin with minerals  1 tablet Per Tube Daily   sodium  chloride flush  3 mL Intravenous Q12H   tamsulosin  0.4 mg Oral Daily   Continuous Infusions:  sodium chloride 10 mL/hr at 12/21/20 1204   sodium chloride     ampicillin (OMNIPEN) IV 2 g (12/23/20 1133)   cefTRIAXone (ROCEPHIN)  IV 2 g (12/23/20 0925)   feeding supplement (OSMOLITE 1.2 CAL) 1,000 mL (12/23/20 1142)   PRN Meds:.sodium chloride, sodium chloride flush   I have personally reviewed following labs and imaging studies  LABORATORY DATA: CBC: Recent Labs  Lab 12/19/20 0610 12/20/20 0153 12/21/20 0151 12/22/20 0224 12/23/20 0234  WBC 10.1 9.1 9.9 10.0 9.4  HGB 8.8* 8.7* 8.8* 8.8* 8.4*  HCT 27.6* 26.8* 26.6* 26.4* 25.9*  MCV 87.9 87.9 86.9 86.3 87.2  PLT 176 191 192 193 201     Basic Metabolic Panel: Recent Labs  Lab 12/18/20 1210 12/18/20 1630 12/19/20 0610 12/19/20 1632 12/20/20 0153 12/21/20 0151 12/22/20 0224 12/23/20 0234  NA  --   --  139  --  137 135 132* 127*  K  --   --  3.5  --  4.2 4.0 4.1 3.9  CL  --   --  108  --  109 104 102 98  CO2  --   --  23  --  23 24 24 23   GLUCOSE  --   --  186*  --  190* 171* 209* 195*  BUN  --   --  16  --  15 15 15 15   CREATININE  --   --  0.76  --  0.69 0.65 0.70 0.74  CALCIUM  --   --  7.7*  --  7.5* 7.5* 7.3* 7.1*  MG 1.9 1.9 2.0 1.9 1.9  --  1.9 2.3  PHOS 2.8 3.1 2.7 2.4*  --   --   --   --      GFR: Estimated Creatinine Clearance: 78.6 mL/min (by C-G formula based on SCr of 0.74 mg/dL).  Liver Function Tests: Recent Labs  Lab 12/19/20 0610 12/20/20 0153 12/21/20 0151 12/22/20 0224 12/23/20 0234  AST 64* 60* 72* 100* 111*  ALT 77* 72* 86* 120* 139*  ALKPHOS 68 61 69 74 79  BILITOT 0.4 0.5 0.3 0.4 0.4  PROT 5.7* 5.3* 5.5* 5.7* 5.6*  ALBUMIN 1.7* 1.6* 1.5* 1.5* <1.5*    No results for input(s): LIPASE, AMYLASE in the last 168 hours. No results for input(s): AMMONIA in the last 168 hours.   Coagulation Profile: No results for input(s): INR, PROTIME in the last 168 hours.   Cardiac  Enzymes: No results for input(s): CKTOTAL, CKMB, CKMBINDEX, TROPONINI in the last 168 hours.  BNP (last 3 results) No results for input(s): PROBNP in the last 8760 hours.  Lipid Profile: No results for input(s): CHOL, HDL, LDLCALC, TRIG, CHOLHDL, LDLDIRECT in the last 72 hours.  Thyroid Function Tests: No results for input(s): TSH,  T4TOTAL, FREET4, T3FREE, THYROIDAB in the last 72 hours.   Anemia Panel: No results for input(s): VITAMINB12, FOLATE, FERRITIN, TIBC, IRON, RETICCTPCT in the last 72 hours.  Urine analysis:    Component Value Date/Time   COLORURINE YELLOW 12/14/2020 1600   APPEARANCEUR CLOUDY (A) 12/14/2020 1600   LABSPEC 1.020 12/14/2020 1600   PHURINE 6.0 12/14/2020 1600   GLUCOSEU NEGATIVE 12/14/2020 1600   HGBUR LARGE (A) 12/14/2020 1600   BILIRUBINUR NEGATIVE 12/14/2020 1600   BILIRUBINUR negative 11/22/2013 0848   KETONESUR NEGATIVE 12/14/2020 1600   PROTEINUR NEGATIVE 12/14/2020 1600   UROBILINOGEN 0.2 11/22/2013 0848   UROBILINOGEN 1.0 08/05/2013 2207   NITRITE POSITIVE (A) 12/14/2020 1600   LEUKOCYTESUR LARGE (A) 12/14/2020 1600    Sepsis Labs: Lactic Acid, Venous    Component Value Date/Time   LATICACIDVEN 1.8 12/14/2020 2056    MICROBIOLOGY: Recent Results (from the past 240 hour(s))  Culture, blood (Routine x 2)     Status: Abnormal   Collection Time: 12/14/20 12:45 PM   Specimen: BLOOD RIGHT FOREARM  Result Value Ref Range Status   Specimen Description BLOOD RIGHT FOREARM  Final   Special Requests   Final    BOTTLES DRAWN AEROBIC AND ANAEROBIC Blood Culture adequate volume   Culture  Setup Time   Final    GRAM POSITIVE COCCI IN BOTH AEROBIC AND ANAEROBIC BOTTLES CRITICAL RESULT CALLED TO, READ BACK BY AND VERIFIED WITH: PHARMD JAMES LEDFORD 12/15/2020@6 :07 BY TW Performed at [facility], [place] [address], [place], [place] [address]    Culture ENTEROCOCCUS FAECALIS (A)  Final   Report Status 12/17/2020 FINAL  Final   Organism ID,  Bacteria ENTEROCOCCUS FAECALIS  Final      Susceptibility   Enterococcus faecalis - MIC*    AMPICILLIN <=2 SENSITIVE Sensitive     VANCOMYCIN 2 SENSITIVE Sensitive     GENTAMICIN SYNERGY SENSITIVE Sensitive     * ENTEROCOCCUS FAECALIS  Blood Culture ID Panel (Reflexed)     Status: Abnormal   Collection Time: 12/14/20 12:45 PM  Result Value Ref Range Status   Enterococcus faecalis DETECTED (A) NOT DETECTED Final    Comment: CRITICAL RESULT CALLED TO, READ BACK BY AND VERIFIED WITH: PHARMD JAMES LEDFORD 12/15/2020@6 :07 BY TW    Enterococcus Faecium NOT DETECTED NOT DETECTED Final   Listeria monocytogenes NOT DETECTED NOT DETECTED Final   Staphylococcus species NOT DETECTED NOT DETECTED Final   Staphylococcus aureus (BCID) NOT DETECTED NOT DETECTED Final   Staphylococcus epidermidis NOT DETECTED NOT DETECTED Final   Staphylococcus lugdunensis NOT DETECTED NOT DETECTED Final   Streptococcus species NOT DETECTED NOT DETECTED Final   Streptococcus agalactiae NOT DETECTED NOT DETECTED Final   Streptococcus pneumoniae NOT DETECTED NOT DETECTED Final   Streptococcus pyogenes NOT DETECTED NOT DETECTED Final   A.calcoaceticus-baumannii NOT DETECTED NOT DETECTED Final   Bacteroides fragilis NOT DETECTED NOT DETECTED Final   Enterobacterales NOT DETECTED NOT DETECTED Final   Enterobacter cloacae complex NOT DETECTED NOT DETECTED Final   Escherichia coli NOT DETECTED NOT DETECTED Final   Klebsiella aerogenes NOT DETECTED NOT DETECTED Final   Klebsiella oxytoca NOT DETECTED NOT DETECTED Final   Klebsiella pneumoniae NOT DETECTED NOT DETECTED Final   Proteus species NOT DETECTED NOT DETECTED Final   Salmonella species NOT DETECTED NOT DETECTED Final   Serratia marcescens NOT DETECTED NOT DETECTED Final   Haemophilus influenzae NOT DETECTED NOT DETECTED Final   Neisseria meningitidis NOT DETECTED NOT DETECTED Final   Pseudomonas aeruginosa  NOT DETECTED NOT DETECTED Final   Stenotrophomonas  maltophilia NOT DETECTED NOT DETECTED Final   Candida albicans NOT DETECTED NOT DETECTED Final   Candida auris NOT DETECTED NOT DETECTED Final   Candida glabrata NOT DETECTED NOT DETECTED Final   Candida krusei NOT DETECTED NOT DETECTED Final   Candida parapsilosis NOT DETECTED NOT DETECTED Final   Candida tropicalis NOT DETECTED NOT DETECTED Final   Cryptococcus neoformans/gattii NOT DETECTED NOT DETECTED Final   Vancomycin resistance NOT DETECTED NOT DETECTED Final    Comment: Performed at [facility], [place] [address] [place], [place] [address]  Resp Panel by RT-PCR (Flu A&B, Covid) Nasopharyngeal Swab     Status: None   Collection Time: 12/14/20  3:41 PM   Specimen: Nasopharyngeal Swab; Nasopharyngeal(NP) swabs in vial transport medium  Result Value Ref Range Status   SARS Coronavirus 2 by RT PCR NEGATIVE NEGATIVE Final    Comment: (NOTE) SARS-CoV-2 target nucleic acids are NOT DETECTED.  The SARS-CoV-2 RNA is generally detectable in upper respiratory specimens during the acute phase of infection. The lowest concentration of SARS-CoV-2 viral copies this assay can detect is 138 copies/mL. A negative result does not preclude SARS-Cov-2 infection and should not be used as the sole basis for treatment or other patient management decisions. A negative result may occur with  improper specimen collection/handling, submission of specimen other than nasopharyngeal swab, presence of viral mutation(s) within the areas targeted by this assay, and inadequate number of viral copies(<138 copies/mL). A negative result must be combined with clinical observations, patient history, and epidemiological information. The expected result is Negative.  Fact Sheet for Patients:  EntrepreneurPulse.com.au  Fact Sheet for Healthcare Providers:  IncredibleEmployment.be  This test is no t yet approved or cleared by the [place] FDA and  has been authorized  for detection and/or diagnosis of SARS-CoV-2 by FDA under an Emergency Use Authorization (EUA). This EUA will remain  in effect (meaning this test can be used) for the duration of the COVID-19 declaration under Section 564(b)(1) of the Act, 21 U.S.C.section 360bbb-3(b)(1), unless the authorization is terminated  or revoked sooner.       Influenza A by PCR NEGATIVE NEGATIVE Final   Influenza B by PCR NEGATIVE NEGATIVE Final    Comment: (NOTE) The Xpert Xpress SARS-CoV-2/FLU/RSV plus assay is intended as an aid in the diagnosis of influenza from Nasopharyngeal swab specimens and should not be used as a sole basis for treatment. Nasal washings and aspirates are unacceptable for Xpert Xpress SARS-CoV-2/FLU/RSV testing.  Fact Sheet for Patients: EntrepreneurPulse.com.au  Fact Sheet for Healthcare Providers: IncredibleEmployment.be  This test is not yet approved or cleared by the [place] FDA and has been authorized for detection and/or diagnosis of SARS-CoV-2 by FDA under an Emergency Use Authorization (EUA). This EUA will remain in effect (meaning this test can be used) for the duration of the COVID-19 declaration under Section 564(b)(1) of the Act, 21 U.S.C. section 360bbb-3(b)(1), unless the authorization is terminated or revoked.  Performed at [facility], [place] [address], [place], [place] [address]   Culture, blood (Routine x 2)     Status: Abnormal   Collection Time: 12/14/20  4:39 PM   Specimen: BLOOD  Result Value Ref Range Status   Specimen Description BLOOD RIGHT HAND  Final   Special Requests   Final    BOTTLES DRAWN AEROBIC AND ANAEROBIC Blood Culture results may not be optimal due to an inadequate volume of blood received in  culture bottles   Culture  Setup Time   Final    GRAM POSITIVE COCCI IN CHAINS IN BOTH AEROBIC AND ANAEROBIC BOTTLES CRITICAL VALUE NOTED.  VALUE IS CONSISTENT WITH PREVIOUSLY REPORTED AND CALLED  VALUE.    Culture (A)  Final    ENTEROCOCCUS FAECALIS SUSCEPTIBILITIES PERFORMED ON PREVIOUS CULTURE WITHIN THE LAST 5 DAYS. Performed at [facility], [place] [address], [place], [place] [address]    Report Status 12/17/2020 FINAL  Final  Urine Culture     Status: Abnormal   Collection Time: 12/14/20  4:43 PM   Specimen: Urine, Clean Catch  Result Value Ref Range Status   Specimen Description URINE, CLEAN CATCH  Final   Special Requests   Final    NONE Performed at [facility], [place] [address], [place], [place] [address]    Culture >=100,000 COLONIES/mL STAPHYLOCOCCUS AUREUS (A)  Final   Report Status 12/17/2020 FINAL  Final   Organism ID, Bacteria STAPHYLOCOCCUS AUREUS (A)  Final      Susceptibility   Staphylococcus aureus - MIC*    CIPROFLOXACIN <=0.5 SENSITIVE Sensitive     GENTAMICIN <=0.5 SENSITIVE Sensitive     NITROFURANTOIN 32 SENSITIVE Sensitive     OXACILLIN <=0.25 SENSITIVE Sensitive     TETRACYCLINE <=1 SENSITIVE Sensitive     VANCOMYCIN <=0.5 SENSITIVE Sensitive     TRIMETH/SULFA <=10 SENSITIVE Sensitive     CLINDAMYCIN <=0.25 SENSITIVE Sensitive     RIFAMPIN <=0.5 SENSITIVE Sensitive     Inducible Clindamycin NEGATIVE Sensitive     * >=100,000 COLONIES/mL STAPHYLOCOCCUS AUREUS  Culture, blood (routine x 2)     Status: Abnormal   Collection Time: 12/15/20 11:20 AM   Specimen: BLOOD RIGHT ARM  Result Value Ref Range Status   Specimen Description BLOOD RIGHT ARM  Final   Special Requests   Final    BOTTLES DRAWN AEROBIC AND ANAEROBIC Blood Culture adequate volume   Culture  Setup Time   Final    GRAM POSITIVE COCCI ANAEROBIC BOTTLE ONLY CRITICAL VALUE NOTED.  VALUE IS CONSISTENT WITH PREVIOUSLY REPORTED AND CALLED VALUE.    Culture (A)  Final    ENTEROCOCCUS FAECALIS SUSCEPTIBILITIES PERFORMED ON PREVIOUS CULTURE WITHIN THE LAST 5 DAYS. Performed at [facility], [place] [address], [place], [place] [address]    Report Status 12/19/2020 FINAL   Final  Culture, blood (routine x 2)     Status: None   Collection Time: 12/15/20 11:25 AM   Specimen: BLOOD LEFT ARM  Result Value Ref Range Status   Specimen Description BLOOD LEFT ARM  Final   Special Requests   Final    BOTTLES DRAWN AEROBIC ONLY Blood Culture results may not be optimal due to an inadequate volume of blood received in culture bottles   Culture   Final    NO GROWTH 5 DAYS Performed at [facility], [place] [address], [place], [place] [address]    Report Status 12/20/2020 FINAL  Final  MRSA Next Gen by PCR, Nasal     Status: None   Collection Time: 12/16/20  4:34 AM   Specimen: Nasal Mucosa; Nasal Swab  Result Value Ref Range Status   MRSA by PCR Next Gen NOT DETECTED NOT DETECTED Final    Comment: (NOTE) The GeneXpert MRSA Assay (FDA approved for NASAL specimens only), is one component of a comprehensive MRSA colonization surveillance program. It is not intended to diagnose MRSA infection nor to guide or monitor  treatment for MRSA infections. Test performance is not FDA approved in patients less than 54 years old. Performed at [facility], [place] [address], [place], [place] [address]   Culture, blood (routine x 2)     Status: Abnormal   Collection Time: 12/17/20  8:01 AM   Specimen: BLOOD RIGHT HAND  Result Value Ref Range Status   Specimen Description BLOOD RIGHT HAND  Final   Special Requests   Final    BOTTLES DRAWN AEROBIC AND ANAEROBIC Blood Culture adequate volume   Culture  Setup Time   Final    GRAM POSITIVE COCCI IN CHAINS ANAEROBIC BOTTLE ONLY CRITICAL VALUE NOTED.  VALUE IS CONSISTENT WITH PREVIOUSLY REPORTED AND CALLED VALUE.    Culture (A)  Final    ENTEROCOCCUS FAECALIS SUSCEPTIBILITIES PERFORMED ON PREVIOUS CULTURE WITHIN THE LAST 5 DAYS. Performed at [facility], [place] [address] [place], [place] [address]    Report Status 12/20/2020 FINAL  Final  Culture, blood (routine x 2)     Status: Abnormal   Collection Time:  12/17/20  8:03 AM   Specimen: BLOOD RIGHT WRIST  Result Value Ref Range Status   Specimen Description BLOOD RIGHT WRIST  Final   Special Requests   Final    BOTTLES DRAWN AEROBIC AND ANAEROBIC Blood Culture adequate volume   Culture  Setup Time   Final    GRAM POSITIVE COCCI ANAEROBIC BOTTLE ONLY CRITICAL VALUE NOTED.  VALUE IS CONSISTENT WITH PREVIOUSLY REPORTED AND CALLED VALUE.    Culture (A)  Final    ENTEROCOCCUS FAECALIS SUSCEPTIBILITIES PERFORMED ON PREVIOUS CULTURE WITHIN THE LAST 5 DAYS. Performed at [facility], [place] [address] [place], [place] [address]    Report Status 12/23/2020 FINAL  Final  Culture, blood (routine x 2)     Status: None (Preliminary result)   Collection Time: 12/19/20  7:46 AM   Specimen: BLOOD  Result Value Ref Range Status   Specimen Description BLOOD RIGHT ANTECUBITAL  Final   Special Requests   Final    BOTTLES DRAWN AEROBIC AND ANAEROBIC Blood Culture adequate volume   Culture   Final    NO GROWTH 4 DAYS Performed at [facility], [place] [address], [place], [place] [address]    Report Status PENDING  Incomplete  Culture, blood (routine x 2)     Status: Abnormal   Collection Time: 12/19/20  7:47 AM   Specimen: BLOOD  Result Value Ref Range Status   Specimen Description BLOOD LEFT ANTECUBITAL  Final   Special Requests   Final    BOTTLES DRAWN AEROBIC AND ANAEROBIC Blood Culture adequate volume   Culture  Setup Time   Final    GRAM POSITIVE COCCI AEROBIC BOTTLE ONLY CRITICAL VALUE NOTED.  VALUE IS CONSISTENT WITH PREVIOUSLY REPORTED AND CALLED VALUE.    Culture (A)  Final    STAPHYLOCOCCUS EPIDERMIDIS THE SIGNIFICANCE OF ISOLATING THIS ORGANISM FROM A SINGLE SET OF BLOOD CULTURES WHEN MULTIPLE SETS ARE DRAWN IS UNCERTAIN. PLEASE NOTIFY THE MICROBIOLOGY DEPARTMENT WITHIN ONE WEEK IF SPECIATION AND SENSITIVITIES ARE REQUIRED. Performed at [facility], [place] [address] [place], [place] [address]    Report Status 12/23/2020  FINAL  Final  Culture, blood (routine x 2)     Status: None (Preliminary result)   Collection Time: 12/21/20  6:32 PM   Specimen: BLOOD RIGHT WRIST  Result Value Ref Range Status   Specimen Description BLOOD RIGHT WRIST  Final  Special Requests   Final    BOTTLES DRAWN AEROBIC AND ANAEROBIC Blood Culture adequate volume   Culture   Final    NO GROWTH 2 DAYS Performed at [facility], [name] [address], [place], [place] [address]    Report Status PENDING  Incomplete  Culture, blood (routine x 2)     Status: None (Preliminary result)   Collection Time: 12/21/20  6:40 PM   Specimen: BLOOD  Result Value Ref Range Status   Specimen Description BLOOD LEFT ANTECUBITAL  Final   Special Requests   Final    BOTTLES DRAWN AEROBIC AND ANAEROBIC Blood Culture adequate volume   Culture   Final    NO GROWTH 2 DAYS Performed at [facility], [place] [address] [place],  [address]    Report Status PENDING  Incomplete    RADIOLOGY STUDIES/RESULTS: DG Wrist 2 Views Right  Result Date: 12/22/2020 CLINICAL DATA:  Tenderness with movement. EXAM: RIGHT WRIST - 2 VIEW COMPARISON:  None. FINDINGS: Radiocarpal joint appears within normal limits. There are degenerative changes the articulation between the scaphoid and the multangular bones and at the first and second carpometacarpal articulations. No sign of fracture. Regional arterial calcification incidentally noted. IMPRESSION: Radial side degenerative changes as described above which could certainly be painful. Electronically Signed   By: Nelson Chimes M.D.   On: 12/22/2020 13:51     LOS: 8 days   Oren Binet, MD  [facility]    To contact the attending provider between 7A-7P or the covering provider during after hours 7P-7A, please log into the web site [email or web address] and access using universal [place] password for that web site. If you do not have the password, please call the hospital operator.  12/23/2020, 1:05  PM

## 2020-12-23 NOTE — Care Management Important Message (Signed)
Important Message  Patient Details  Name: [name] [name] MRN: [id number] Date of Birth: June 28, 1940   Medicare Important Message Given:  Yes     Orbie Pyo 12/23/2020, 4:11 PM

## 2020-12-23 NOTE — Progress Notes (Signed)
Nutrition Follow-up  DOCUMENTATION CODES:   Severe malnutrition in context of chronic illness  INTERVENTION:   -Continue TF via cortrak:  Osmolite 1.2 @ 75 ml/hr  45 ml Prosource TF daily  125 ml free water flush every 4 hours  TF regimen provides 220 kclas, 111 grams protein, and 2226 ml free water daily  -Continue MVI with minerals daily  NUTRITION DIAGNOSIS:   Severe Malnutrition related to chronic illness (CHF, Stroke) as evidenced by percent weight loss, moderate fat depletion, severe muscle depletion.  Ongoing  GOAL:   Patient will meet greater than or equal to 90% of their needs  Met with TF  MONITOR:   TF tolerance, Labs, Weight trends, Skin, I & O's  REASON FOR ASSESSMENT:   Malnutrition Screening Tool    ASSESSMENT:   80 y.o. male presented to the ED with lethargy and change in mental status, pt coming from SNF. PMH includes stroke in May and June of 2022, CHF, diverticulosis, HTN, and T2DM. PT admitted with UTI, dehydration, and Stage II pressure injury.  10/14- cortrak tube placed, TF initiated; s/p TEE- revealed bioprosthetic valve endocarditis- not a candidate for re-do surgery 10/18- s/p BSE- advanced to dysphagia 1 diet with nectar thick liquids   Reviewed I/O's: +277 ml x 24 hours and +8.8 L since admission  UOP: 2.2 L x 24 hours  Pt with minimal intake. Noted meal completion 0%.   Pt very lethargic at time of visit. He opened his eyes slightly to touch, but went back to sleep. No family at bedside.   Pt continues to tolerate TF well.  Medications reviewed and include lasix.   Labs reviewed: Na: 127, CBGS: 158-183 (inpatient orders for glycemic control are 0-9 units insulin aspart TID with meals).    Diet Order:   Diet Order             DIET - DYS 1 Room service appropriate? Yes; Fluid consistency: Nectar Thick  Diet effective now                   EDUCATION NEEDS:   No education needs have been identified at this  time  Skin:  Skin Assessment: Skin Integrity Issues: Skin Integrity Issues:: Stage I, Stage II Stage I: Buttocks Stage II: L Elbow  Last BM:  12/23/20  Height:   Ht Readings from Last 1 Encounters:  12/14/20 _0  (1.702 m)    Weight:   Wt Readings from Last 1 Encounters:  12/23/20 83.4 kg    Ideal Body Weight:  67.3 kg  BMI:  Body mass index is 28.8 kg/m.  Estimated Nutritional Needs:   Kcal:  2100-2300  Protein:  105-120 grams  Fluid:  >/= 2.1 L    Loistine Chance, RD, LDN, [place] Registered Dietitian II Certified Diabetes Care and Education Specialist Please refer to [facility] for RD and/or RD on-call/weekend/after hours pager

## 2020-12-23 NOTE — Progress Notes (Addendum)
Patient ID: [name] [name], male   DOB: 08/15/1940, 80 y.o.   MRN: [id number]     Advanced Heart Failure Rounding Note  PCP-Cardiologist: Loralie Champagne, MD   Subjective:    Wife present this morning, more alert today.   Blood cultures from 10/15 positive for S epidermidis, likely contaminant.  Afebrile.   Cortrak in place, getting tube feeds.   ECG this morning with QTc down to 488 msec after decreasing Tikosyn.   TEE: EF 60-65%, RV probably normal, bioprosthetic AVR mean gradient 14 with trivial AI, bioprosthetic MV with trivial MR, mean gradient 8, vegetation on the sewing ring and also on the leaflets (relatively small size).     Objective:   Weight Range: 83.4 kg Body mass index is 28.8 kg/m.   Vital Signs:   Temp:  [97.1 F (36.2 C)-98.7 F (37.1 C)] 98.7 F (37.1 C) (10/19 0759) Pulse Rate:  [76-80] 77 (10/19 0759) Resp:  [16-25] 20 (10/19 0759) BP: (100-112)/(61-69) 111/69 (10/19 0759) SpO2:  [92 %-98 %] 97 % (10/19 0759) Weight:  [83.4 kg] 83.4 kg (10/19 0500) Last BM Date: 12/21/20  Weight change: Filed Weights   12/21/20 0456 12/22/20 0431 12/23/20 0500  Weight: 83.9 kg 85.7 kg 83.4 kg    Intake/Output:   Intake/Output Summary (Last 24 hours) at 12/23/2020 0901 Last data filed at 12/23/2020 0700 Gross per 24 hour  Intake 2451.99 ml  Output 2175 ml  Net 276.99 ml      Physical Exam    General: NAD Neck: No JVD, no thyromegaly or thyroid nodule.  Lungs: Clear to auscultation bilaterally with normal respiratory effort. CV: Nondisplaced PMI.  Heart regular S1/S2, no S3/S4, 1/6 SEM RUSB.  No peripheral edema.   Abdomen: Soft, nontender, no hepatosplenomegaly, no distention.  Skin: Intact without lesions or rashes.  Neurologic: Alert, follows some commands from wife.  Extremities: No clubbing or cyanosis.  HEENT: Normal.    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 Personally reviewed.   Labs    CBC Recent Labs    12/22/20 0224 12/23/20 0234   WBC 10.0 9.4  HGB 8.8* 8.4*  HCT 26.4* 25.9*  MCV 86.3 87.2  PLT 193 562   Basic Metabolic Panel Recent Labs    12/22/20 0224 12/23/20 0234  NA 132* 127*  K 4.1 3.9  CL 102 98  CO2 24 23  GLUCOSE 209* 195*  BUN 15 15  CREATININE 0.70 0.74  CALCIUM 7.3* 7.1*  MG 1.9 2.3   Liver Function Tests Recent Labs    12/22/20 0224 12/23/20 0234  AST 100* 111*  ALT 120* 139*  ALKPHOS 74 79  BILITOT 0.4 0.4  PROT 5.7* 5.6*  ALBUMIN 1.5* <1.5*   No results for input(s): LIPASE, AMYLASE in the last 72 hours. Cardiac Enzymes No results for input(s): CKTOTAL, CKMB, CKMBINDEX, TROPONINI in the last 72 hours.  BNP: BNP (last 3 results) No results for input(s): BNP in the last 8760 hours.  ProBNP (last 3 results) No results for input(s): PROBNP in the last 8760 hours.   D-Dimer No results for input(s): DDIMER in the last 72 hours. Hemoglobin A1C No results for input(s): HGBA1C in the last 72 hours.  Fasting Lipid Panel No results for input(s): CHOL, HDL, LDLCALC, TRIG, CHOLHDL, LDLDIRECT in the last 72 hours. Thyroid Function Tests No results for input(s): TSH, T4TOTAL, T3FREE, THYROIDAB in the last 72 hours.  Invalid input(s): FREET3  Other results:   Imaging    DG  Wrist 2 Views Right  Result Date: 12/22/2020 CLINICAL DATA:  Tenderness with movement. EXAM: RIGHT WRIST - 2 VIEW COMPARISON:  None. FINDINGS: Radiocarpal joint appears within normal limits. There are degenerative changes the articulation between the scaphoid and the multangular bones and at the first and second carpometacarpal articulations. No sign of fracture. Regional arterial calcification incidentally noted. IMPRESSION: Radial side degenerative changes as described above which could certainly be painful. Electronically Signed   By: Nelson Chimes M.D.   On: 12/22/2020 13:51     Medications:     Scheduled Medications:  amantadine  200 mg Per Tube BID   apixaban  5 mg Per Tube BID   diclofenac  Sodium  4 g Topical QID   dofetilide  375 mcg Oral BID   feeding supplement (PROSource TF)  45 mL Per Tube Daily   finasteride  5 mg Oral Daily   insulin aspart  0-9 Units Subcutaneous TID WC   lacosamide  50 mg Per Tube BID   magnesium gluconate  500 mg Per Tube Daily   mouth rinse  15 mL Mouth Rinse BID   multivitamin with minerals  1 tablet Per Tube Daily   potassium chloride  40 mEq Per Tube Once   sodium chloride flush  3 mL Intravenous Q12H   tamsulosin  0.4 mg Oral Daily    Infusions:  sodium chloride 10 mL/hr at 12/21/20 1204   sodium chloride     ampicillin (OMNIPEN) IV 2 g (12/23/20 0529)   cefTRIAXone (ROCEPHIN)  IV 2 g (12/22/20 2153)   feeding supplement (OSMOLITE 1.2 CAL) 1,000 mL (12/22/20 2151)    PRN Medications: sodium chloride, sodium chloride flush   Assessment/Plan   1. 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  Baseline, he is bed-bound and per wife communicates minimally, left hemiparesis.  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  He seems more alert today, wife is present.  2. Hypernatremia: Resolved, Na now actually low at 127.  3. Enterococcal bacteremia: Source uncertain.  No PNA noted on CXR.  Enterococcus did not grow in urine.  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  He has a new left cerebellar CVA (small) that may be embolic from MV vegetation.  Blood cultures from 10/10, 10/11, 10/13 were positive.  Cultures from 10/15 with S epidermidis, likely contaminant.  Resent 10/17. Afebrile, WBCs 9.4.   - Continues on ceftriaxone and ampicillin.  - ID following.  - Not candidate for cardiac  surgery, management will have to be medical.  4. 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  Echo this admission with EF 65-70%, normal RV, bioprosthetic AVR mean gradient 16 mmHg appears normal, bioprosthetic MV mean gradient 10, increased from prior but HR also in 100s when study done.  TEE as above showed bioprosthetic MV vegetation with trivial MR and mean gradient 8.  5. Atrial fibrillation/Flutter: Maze in 2006 + 2 flutter/fib ablations at [facility]. Remains in NSR. Tikosyn decreased yesterday, QTc acceptable today.  - Continue Tikosyn 375 mcg bid. Will recheck ECG in am.  - Continue Eliquis.  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  6. Malnutrition:  Albumin 1.5.  TFs via Cortrak.  7. Elevated LFTs: Statin stopped.    Length of Stay: 8  Loralie Champagne, MD  12/23/2020, 9:01 AM  Advanced Heart Failure Team Pager [phone] (M-F; 7a - 5p)  Please contact [facility] for night-coverage after hours (5p -7a ) and weekends on [email or web address]

## 2020-12-23 NOTE — Progress Notes (Addendum)
STROKE TEAM PROGRESS NOTE   SUBJECTIVE (INTERVAL HISTORY) His wife is at the bedside. Pt lying in bed, eyes closed, when I called his name, he opened his eyes and turned his head and looked at me, but still nonverbal and did not following commands. Mild improvement of mental status comparing with yesterday. His LFTs continue to elevate, statin discontinued. Na 127 today.     OBJECTIVE Temp:  [97.1 F (36.2 C)-98.7 F (37.1 C)] 98.5 F (36.9 C) (10/19 1548) Pulse Rate:  [75-80] 75 (10/19 1548) Cardiac Rhythm: Heart block (10/19 0712) Resp:  [16-21] 18 (10/19 1548) BP: (100-122)/(61-69) 122/67 (10/19 1548) SpO2:  [95 %-98 %] 95 % (10/19 1548) Weight:  [83.4 kg] 83.4 kg (10/19 0500)  Recent Labs  Lab 12/22/20 2351 12/23/20 0455 12/23/20 0801 12/23/20 1140 12/23/20 1552  GLUCAP 179* 183* 158* 175* 205*   Recent Labs  Lab 12/18/20 1210 12/18/20 1630 12/19/20 0610 12/19/20 1632 12/20/20 0153 12/21/20 0151 12/22/20 0224 12/23/20 0234  NA  --   --  139  --  137 135 132* 127*  K  --   --  3.5  --  4.2 4.0 4.1 3.9  CL  --   --  108  --  109 104 102 98  CO2  --   --  23  --  23 24 24 23   GLUCOSE  --   --  186*  --  190* 171* 209* 195*  BUN  --   --  16  --  15 15 15 15   CREATININE  --   --  0.76  --  0.69 0.65 0.70 0.74  CALCIUM  --   --  7.7*  --  7.5* 7.5* 7.3* 7.1*  MG 1.9 1.9 2.0 1.9 1.9  --  1.9 2.3  PHOS 2.8 3.1 2.7 2.4*  --   --   --   --    Recent Labs  Lab 12/19/20 0610 12/20/20 0153 12/21/20 0151 12/22/20 0224 12/23/20 0234  AST 64* 60* 72* 100* 111*  ALT 77* 72* 86* 120* 139*  ALKPHOS 68 61 69 74 79  BILITOT 0.4 0.5 0.3 0.4 0.4  PROT 5.7* 5.3* 5.5* 5.7* 5.6*  ALBUMIN 1.7* 1.6* 1.5* 1.5* <1.5*   Recent Labs  Lab 12/19/20 0610 12/20/20 0153 12/21/20 0151 12/22/20 0224 12/23/20 0234  WBC 10.1 9.1 9.9 10.0 9.4  HGB 8.8* 8.7* 8.8* 8.8* 8.4*  HCT 27.6* 26.8* 26.6* 26.4* 25.9*  MCV 87.9 87.9 86.9 86.3 87.2  PLT 176 191 192 193 201   No results for  input(s): CKTOTAL, CKMB, CKMBINDEX, TROPONINI in the last 168 hours. No results for input(s): LABPROT, INR in the last 72 hours. No results for input(s): COLORURINE, LABSPEC, [place], GLUCOSEU, HGBUR, BILIRUBINUR, KETONESUR, PROTEINUR, UROBILINOGEN, NITRITE, LEUKOCYTESUR in the last 72 hours.  Invalid input(s): APPERANCEUR     Component Value Date/Time   CHOL 119 08/08/2020 0625   CHOL 102 11/26/2013 0000   TRIG 105 08/08/2020 0625   TRIG 93 11/26/2013 0000   HDL 42 08/08/2020 0625   CHOLHDL 2.8 08/08/2020 0625   VLDL 21 08/08/2020 0625   LDLCALC 56 08/08/2020 0625   LDLCALC 58 11/26/2013 0000   Lab Results  Component Value Date   HGBA1C 7.3 (H) 12/17/2020      Component Value Date/Time   LABOPIA NONE DETECTED 08/05/2013 2207   COCAINSCRNUR NONE DETECTED 08/05/2013 2207   LABBENZ NONE DETECTED 08/05/2013 2207   AMPHETMU NONE DETECTED 08/05/2013 2207  THCU NONE DETECTED 08/05/2013 2207   LABBARB NONE DETECTED 08/05/2013 2207    No results for input(s): ETH in the last 168 hours.  I have personally reviewed the radiological images below and agree with the radiology interpretations.  DG Wrist 2 Views Right  Result Date: 12/22/2020 CLINICAL DATA:  Tenderness with movement. EXAM: RIGHT WRIST - 2 VIEW COMPARISON:  None. FINDINGS: Radiocarpal joint appears within normal limits. There are degenerative changes the articulation between the scaphoid and the multangular bones and at the first and second carpometacarpal articulations. No sign of fracture. Regional arterial calcification incidentally noted. IMPRESSION: Radial side degenerative changes as described above which could certainly be painful. Electronically Signed   By: Nelson Chimes M.D.   On: 12/22/2020 13:51   CT Head Wo Contrast  Result Date: 12/14/2020 CLINICAL DATA:  Mental status change, unknown cause EXAM: CT HEAD WITHOUT CONTRAST TECHNIQUE: Contiguous axial images were obtained from the base of the skull through the  vertex without intravenous contrast. 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 Old left parietal infarct. Findings are stable since prior study. Small right subdural hematoma again noted overlying the right frontoparietal lobe, 3 mm compared to 5 mm previously. There is atrophy and chronic small vessel disease changes. Ventriculomegaly likely rib related to ex vacuo dilatation. No acute infarct or acute hemorrhage. Vascular: No hyperdense vessel or unexpected calcification. Skull: Prior right temporoparietal craniotomy. No acute calvarial abnormality. Sinuses/Orbits: No acute findings Other: None IMPRESSION: Small right subdural hematoma, 3 mm compared with 5 mm previously. No new hemorrhage. Old right MCA and left parietal infarcts, stable. Ventriculomegaly related to ex vacuo dilatation. Atrophy, chronic microvascular disease. No acute intracranial abnormality. Electronically Signed   By: Rolm Baptise M.D.   On: 12/14/2020 13:50   MR BRAIN WO CONTRAST  Result Date: 12/18/2020 CLINICAL DATA:  Mental status change, unknown cause EXAM: MRI HEAD WITHOUT CONTRAST TECHNIQUE: Multiplanar, multiecho pulse sequences of the brain and surrounding structures were obtained without intravenous contrast. COMPARISON:  08/29/2020 FINDINGS: Motion artifact is present. Brain: Small focus of reduced diffusion is present within the left cerebellar vermis. Diffusion hyperintensity in the region of prior right MCA infarct is probably artifactual. There is a 1.2 cm subacute hematoma of the anterior right superior frontal gyrus (series 6, image 18). Chronic right MCA territory infarction with chronic blood products. Ex vacuo dilatation of the adjacent right lateral ventricle. Chronic infarct on the left near junction of parietal, temporal, and occipital lobes also with chronic blood products. Additional confluent T2 hyperintensity in the supratentorial white matter is nonspecific but may  reflect chronic microvascular ischemic changes. Disproportionate prominence of the lateral and third ventricles is unchanged. Thin extra-axial collection underlies craniotomy. Vascular: Major vessel flow voids at the skull base are preserved. Skull and upper cervical spine: Normal marrow signal is preserved. Prior right craniotomy. Sinuses/Orbits: Minor mucosal thickening. No acute orbital abnormality. Other: Sella is unremarkable.  Mastoid air cells are clear. IMPRESSION: Small acute infarct left cerebellar vermis. Small subacute hematoma anterior right frontal lobe (isodense on CT). Stable chronic findings of infarcts, volume loss, and chronic microvascular ischemic changes. Electronically Signed   By: Macy Mis M.D.   On: 12/18/2020 12:49   DG Chest Port 1 View  Result Date: 12/14/2020 CLINICAL DATA:  Altered mental status EXAM: PORTABLE CHEST 1 VIEW COMPARISON:  Chest radiograph 08/07/2020 FINDINGS: Median sternotomy wires and mitral and aortic valve prostheses are stable. The cardiomediastinal silhouette is stable. There is calcified atherosclerotic  plaque of the aortic arch. Lung volumes are low. There is no focal consolidation or pulmonary edema. There is no pleural effusion or pneumothorax. There is no acute osseous abnormality. IMPRESSION: Low lung volumes. Otherwise, no radiographic evidence of acute cardiopulmonary process. Electronically Signed   By: Valetta Mole M.D.   On: 12/14/2020 13:01   DG Abd Portable 1V  Result Date: 12/18/2020 CLINICAL DATA:  Feeding tube placement EXAM: PORTABLE ABDOMEN - 1 VIEW COMPARISON:  09/01/2020 FINDINGS: Feeding tube terminates at the distal stomach. Cardiomegaly with median sternotomy and left base airspace disease. Non-obstructive bowel gas pattern. No gross free intraperitoneal air. IMPRESSION: Feeding tube terminating at the distal stomach. Electronically Signed   By: Abigail Miyamoto M.D.   On: 12/18/2020 16:44   EEG adult  Result Date:  12/17/2020 Lora Havens, MD     12/17/2020  1:00 PM Patient Name: BERTIS HUSTEAD MRN: [id number] Epilepsy Attending: Lora Havens Referring Physician/Provider: Dr Oren Binet Date: 12/17/2020 Duration: 24.04 mins Patient history: 80 year old male with recent subdural hematoma in May, seizures who presented with altered mental status.  EEG to evaluate for seizures. Level of alertness: Awake AEDs during EEG study: LEV Technical aspects: This EEG study was done with scalp electrodes positioned according to the 10-20 International system of electrode placement. Electrical activity was acquired at a sampling rate of 500Hz  and reviewed with a high frequency filter of 70Hz  and a low frequency filter of 1Hz . EEG data were recorded continuously and digitally stored. Description: The posterior dominant rhythm consists of 8-9 Hz activity of moderate voltage (25-35 uV) seen predominantly in posterior head regions, symmetric and reactive to eye opening and eye closing. EEG showed continuous low amplitude 2 to 3 Hz delta slowing in right frontotemporal region. Intermittent generalized 3 to 5 Hz theta-delta slowing was also noted. Hyperventilation and photic stimulation were not performed.   ABNORMALITY - Continuous slow, right frontotemporal region - Intermittent slow, generalized IMPRESSION: This study is suggestive of cortical dysfunction in right frontotemporal region likely secondary to underlying structural abnormality/stroke/subdural hemorrhage.  Additionally there is mild diffuse encephalopathy, nonspecific etiology.  No seizures or epileptiform discharges were seen throughout the recording.  If suspicion for ictal- interictal activity remains a concern, a prolonged study can be considered. Priyanka Barbra Sarks   Overnight EEG with video  Result Date: 12/18/2020 Lora Havens, MD     12/19/2020  7:36 AM Patient Name: LORETO LOESCHER MRN: [id number] Epilepsy Attending: Lora Havens Referring  Physician/Provider: Dr Oren Binet Duration: 12/17/2020 1638 to 12/18/2020 1002  Patient history: 80 year old male with recent subdural hematoma in May, seizures who presented with altered mental status.  EEG to evaluate for seizures.  Level of alertness: Awake, asleep  AEDs during EEG study: LEV  Technical aspects: This EEG study was done with scalp electrodes positioned according to the 10-20 International system of electrode placement. Electrical activity was acquired at a sampling rate of 500Hz  and reviewed with a high frequency filter of 70Hz  and a low frequency filter of 1Hz . EEG data were recorded continuously and digitally stored.  Description: The posterior dominant rhythm consists of 8-9 Hz activity of moderate voltage (25-35 uV) seen predominantly in posterior head regions, symmetric and reactive to eye opening and eye closing.  Sleep was characterized by vertex waves, sleep spindles (12 to 14 Hz), maximal frontocentral region.  EEG showed continuous low amplitude 2 to 3 Hz delta slowing in right frontotemporal region. Hyperventilation and photic stimulation were not performed.  ABNORMALITY - Continuous slow, right frontotemporal region  IMPRESSION: This study is suggestive of cortical dysfunction in right frontotemporal region likely secondary to underlying structural abnormality/stroke/subdural hemorrhage. No seizures or epileptiform discharges were seen throughout the recording. Lora Havens   ECHOCARDIOGRAM COMPLETE  Result Date: 12/15/2020    ECHOCARDIOGRAM REPORT   Patient Name:   Ivor MANOAH DECKARD Date of Exam: 12/15/2020 Medical Rec #:  [id number]     Height:       67.0 in Accession #:    [id number]    Weight:       166.2 lb Date of Birth:  01-24-1941     BSA:          1.869 m Patient Age:    71 years      BP:           120/67 mmHg Patient Gender: M             HR:           98 bpm. Exam Location:  Inpatient Procedure: 2D Echo, Cardiac Doppler, Color Doppler and Intracardiac             Opacification Agent Indications:    Fever R50.9  History:        Patient has prior history of Echocardiogram examinations, most                 recent 08/07/2020. Stroke; Arrythmias:Atrial Fibrillation. Past                 history of rheumatic fever, acute rheumatic pericarditis.                 Thoracic aortic aneurysm.                 Aortic Valve: 27 mm Carpentier-Edwards prosthetic valve is                 present in the aortic position. Procedure Date: 10/18/2004.  Sonographer:    Darlina Sicilian RDCS Sonographer#2:  Darlina Sicilian RDCS Referring Phys: [id number] [place]  1. The mitral valve has been replaced with an unknown bioprosthesis. No evidence of mitral valve regurgitation. The mean mitral valve gradient is 10.0 mmHg with average heart rate of 101 bpm. PHT 70 ms. Suboptimal LVOT spectral Doppler- VTI deferred. Date of Procedure Date: 10/18/2004.  2. The aortic valve has been repaired/replaced. Aortic valve regurgitation is not visualized. 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 Procedure Date: 10/18/2004. Aortic valve mean gradient measures 16.0 mmHg.  Aortic valve acceleration time measures 74 msec.  3. Left ventricular ejection fraction, by estimation, is 65 to 70%. The left ventricle has normal function. The left ventricle has no regional wall motion abnormalities. There is mild concentric left ventricular hypertrophy. Left ventricular diastolic parameters are indeterminate.  4. Aortic dilatation noted. 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  5. Right ventricular systolic function is normal. The right ventricular size is normal. Comparison(s): A prior study was performed on 08/07/2020. Prior images reviewed side by side. Stable aortic root and ascending aorta size. Increased mitral valve gradients, but at higher hear rates from prior. FINDINGS   Left Ventricle: Left ventricular ejection fraction, by estimation, is 65 to 70%. The left ventricle has normal function. The left ventricle has no regional wall motion abnormalities. Definity contrast agent was given IV to  delineate the left ventricular  endocardial borders. The left ventricular internal cavity size was normal in size. There is mild concentric left ventricular hypertrophy. Left ventricular diastolic parameters are indeterminate. Right Ventricle: The right ventricular size is normal. No increase in right ventricular wall thickness. Right ventricular systolic function is normal. Left Atrium: Left atrial size was normal in size. Right Atrium: Right atrial size was normal in size. Pericardium: There is no evidence of pericardial effusion. Mitral Valve: The mitral valve has been repaired/replaced. No evidence of mitral valve regurgitation. There is a 29 mm Carpentier-Edwards prosthetic present in the mitral position. MV peak gradient, 17.1 mmHg. The mean mitral valve gradient is 10.0 mmHg with average heart rate of 101 bpm. Tricuspid Valve: The tricuspid valve is grossly normal. Tricuspid valve regurgitation is mild . No evidence of tricuspid stenosis. Aortic Valve: The aortic valve has been repaired/replaced. Aortic valve regurgitation is not visualized. Aortic valve mean gradient measures 16.0 mmHg. Aortic valve peak gradient measures 24.4 mmHg. Aortic valve area, by VTI measures 1.56 cm. There is a  27 mm Carpentier-Edwards prosthetic valve present in the aortic position. Procedure Date: 10/18/2004. Pulmonic Valve: The pulmonic valve was normal in structure. Pulmonic valve regurgitation is mild. No evidence of pulmonic stenosis. Aorta: The ascending aorta was not well visualized and aortic dilatation noted. 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 IAS/Shunts: The atrial septum is grossly normal.  LEFT  VENTRICLE PLAX 2D LVIDd:         4.80 cm LVIDs:         3.50 cm LV PW:         1.30 cm LV IVS:        1.30 cm LVOT diam:     3.25 cm LV SV:         62 LV SV Index:   33 LVOT Area:     8.30 cm  LV Volumes (MOD) LV vol d, MOD A2C: 81.2 ml LV vol d, MOD A4C: 94.4 ml LV vol s, MOD A2C: 18.0 ml LV vol s, MOD A4C: 30.1 ml LV SV MOD A2C:     63.2 ml LV SV MOD A4C:     94.4 ml LV SV MOD BP:      63.9 ml RIGHT VENTRICLE RV S prime:     10.40 cm/s LEFT ATRIUM             Index        RIGHT ATRIUM          Index LA diam:        4.50 cm 2.41 cm/m   RA Area:     8.74 cm LA Vol (A2C):   58.1 ml 31.08 ml/m  RA Volume:   12.60 ml 6.74 ml/m LA Vol (A4C):   56.9 ml 30.44 ml/m LA Biplane Vol: 58.9 ml 31.51 ml/m  AORTIC VALVE AV Area (Vmax):    5.29 cm AV Area (Vmean):   1.55 cm AV Area (VTI):     1.56 cm AV Vmax:           247.00 cm/s AV Vmean:          190.500 cm/s AV VTI:            0.401 m AV Peak Grad:      24.4 mmHg AV Mean Grad:      16.0 mmHg LVOT Vmax:  157.40 cm/s LVOT Vmean:        35.700 cm/s LVOT VTI:          0.075 m LVOT/AV VTI ratio: 0.19  AORTA Ao Asc diam: 4.90 cm MITRAL VALVE                TRICUSPID VALVE MV Area (PHT): 3.16 cm     TR Peak grad:   26.4 mmHg MV Area VTI:   1.39 cm     TR Vmax:        257.00 cm/s MV Peak grad:  17.1 mmHg MV Mean grad:  10.0 mmHg    SHUNTS MV Vmax:       2.07 m/s     Systemic VTI:  0.08 m MV Vmean:      151.0 cm/s   Systemic Diam: 3.25 cm MV Decel Time: 240 msec MV E velocity: 195.00 cm/s Rudean Haskell MD Electronically signed by Rudean Haskell MD Signature Date/Time: 12/15/2020/12:49:20 PM    Final       PHYSICAL EXAM  Temp:  [97.1 F (36.2 C)-98.7 F (37.1 C)] 98.5 F (36.9 C) (10/19 1548) Pulse Rate:  [75-80] 75 (10/19 1548) Resp:  [16-21] 18 (10/19 1548) BP: (100-122)/(61-69) 122/67 (10/19 1548) SpO2:  [95 %-98 %] 95 % (10/19 1548) Weight:  [83.4 kg] 83.4 kg (10/19 0500)  General - Well nourished, well developed, in no apparent  distress.  Ophthalmologic - fundi not visualized due to noncooperation.  Cardiovascular - Regular rhythm and rate, not in afib.  Neuro - eyes closed, however open eyes on voice, turned head towards voice and able to maintain eyes open for several minutes.  Still nonverbal, not follow commands.  Able to blinking spontaneous, however not blinking to visual threat bilaterally.  No tracking.  Not interactive.  Mild left facial droop.  Tongue protrusion not corporative.  Left upper and lower extremity hemiplegia except left lower extremity slight withdraw with pain stimulation.  Right upper extremity localizing to pain, purposeful barely against gravity.  Right lower extremity no spontaneous movement, however withdrawal to pain 2/5. Sensation, coordination and gait not tested.   ASSESSMENT/PLAN Mr. LEODIS ALCOCER is a 80 y.o. 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 fib status post maze procedure and LAA clipping, stroke admitted for lethargy, weight loss, bacteremia and altered mental status. No tPA given due to outside window.    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  Ongoing aggressive stroke risk factor management Therapy recommendations: Pending Disposition: Pending  Bacteremia Endocarditis UTI TEE showed small mitral wall vegetation Blood culture positive for Enterococcus->repeat Cx 10/13 1/2 positive->repeat Cx 10/15 again 1/2 positive but likely contamination  Blood Cx 10/17 NGTD Urine culture positive for staph aureus Currently on ampicillin and Rocephin ID on board   PAF  Status post maze and left AA clipping Was on Coumadin before Now on Eliquis and Tikosyn Cardiology on board  History of stroke, SDH and seizure Stroke in 08/2013 07/27/2020 admitted for traumatic right large SDH status post bur hole evacuation.  Coumadin was on hold due to SDH 08/07/2020 admitted for altered mental status with left-sided weakness, CT showed right MCA large infarct.  CTA head and neck right ICA and right M2 occlusion status post IR with TICI2c revascularization.  EF 60 to 65%, LDL 56, A1c 6.2, patient discharged to [facility].  Started Eliquis on 08/25/2020 in CIR. 08/28/2020 had a seizure activity, MRI no significant change, put on Keppra. Current admission, EEG and long-term EEG no seizure.  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 She expressed understanding and appreciation.    Rosalin Hawking, MD PhD Stroke  Neurology 12/23/2020 5:06 PM    To contact Stroke Continuity provider, please refer to [email or web address] After hours, contact General Neurology

## 2020-12-23 NOTE — Progress Notes (Signed)
Speech Language Pathology Treatment: Dysphagia  Patient Details Name: HESHAM WOMAC MRN: [id number] DOB: [birth year] Today's Date: 12/23/2020 Time: 9675-9163 SLP Time Calculation (min) (ACUTE ONLY): 31 min  Assessment / Plan / Recommendation Clinical Impression  Pt seen for ongoing dysphagia management.  Pt asleep on SLP arrival but roused with repositioning and was able to maintain alert status throughout today's session.  Wife, Shirlee [place], reports pt was able to eat some of pureed dinner last night.  Today pt tolerated nectar thick liquids and mechanical soft solids with no clinical s/s of aspiration and exhibited good oral clearance of solids after mildly prolonged oral phase.  Pt is able to indicate when he is ready for another bite.  Wife had cup from home present which she used to give some nectar thick liquids, but spoon seemed to work better.  Pt was able to take sips from side of styrofoam cup.  SLP removed part of cup to create nosy-cup.  This worked well during this session, but wife reports at home that pt will sometimes bite styrofoam cups.  Discussed diet preference with pt and family.  Pt appears safe to upgrade to mechanical soft foods; however, wife would prefer to stick with puree for a bit longer to make sure he is able to maintain attention and remain alert during meals.  SLP will not upgrade diet at this time.  Please place orders for mechanical soft foods if family request upgrade.  Recommend diet texture advancement up to mechanical soft per family preference and continue nectar thick liquids by cup or spoon.    HPI HPI: 80 y.o. 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   TEE completed on 12/18/20. Pt has a hx of cognitive deficits and dysphagia, with most recent MBS on 09/02/20 and most recent diet recommendations for Dysphagia 1 solids and honey-thick liquids.  Per family, pt has since advanced to mechanical soft solid and NTL.  Cortrak placed 12/18/20.      SLP Plan  Continue with current plan of care      Recommendations for follow up therapy are one component of a multi-disciplinary discharge planning process, led by the attending physician.  Recommendations may be updated based on patient status, additional functional criteria and insurance authorization.    Recommendations  Diet recommendations: Dysphagia 3 (mechanical soft);Nectar-thick liquid Liquids provided via: Teaspoon;Cup Medication Administration: Crushed with puree Supervision: Trained caregiver to feed patient Compensations: Slow rate;Small sips/bites Postural Changes and/or Swallow Maneuvers: Seated upright 90 degrees                Oral Care Recommendations: Oral care BID;Staff/trained caregiver to provide oral care Follow up Recommendations: Skilled Nursing facility SLP Visit Diagnosis: Dysphagia, unspecified (R13.10) Plan: Continue with current plan of care       [place], [place], [place] Office: [phone]  12/23/2020, 12:41 PM

## 2020-12-24 DIAGNOSIS — Z9889 Other specified postprocedural states: Secondary | ICD-10-CM | POA: Diagnosis not present

## 2020-12-24 DIAGNOSIS — R7881 Bacteremia: Secondary | ICD-10-CM | POA: Diagnosis not present

## 2020-12-24 DIAGNOSIS — E43 Unspecified severe protein-calorie malnutrition: Secondary | ICD-10-CM | POA: Diagnosis not present

## 2020-12-24 DIAGNOSIS — Z8673 Personal history of transient ischemic attack (TIA), and cerebral infarction without residual deficits: Secondary | ICD-10-CM | POA: Diagnosis not present

## 2020-12-24 DIAGNOSIS — I330 Acute and subacute infective endocarditis: Secondary | ICD-10-CM | POA: Diagnosis not present

## 2020-12-24 LAB — GLUCOSE, CAPILLARY
Glucose-Capillary: 134 mg/dL — ABNORMAL HIGH (ref 70–99)
Glucose-Capillary: 175 mg/dL — ABNORMAL HIGH (ref 70–99)
Glucose-Capillary: 162 mg/dL — ABNORMAL HIGH (ref 70–99)
Glucose-Capillary: 192 mg/dL — ABNORMAL HIGH (ref 70–99)
Glucose-Capillary: 234 mg/dL — ABNORMAL HIGH (ref 70–99)

## 2020-12-24 LAB — CULTURE, BLOOD (ROUTINE X 2)
Culture: NO GROWTH
Special Requests: ADEQUATE

## 2020-12-24 LAB — COMPREHENSIVE METABOLIC PANEL
ALT: 161 U/L — ABNORMAL HIGH (ref 0–44)
AST: 111 U/L — ABNORMAL HIGH (ref 15–41)
Albumin: 1.5 g/dL — ABNORMAL LOW (ref 3.5–5.0)
Alkaline Phosphatase: 85 U/L (ref 38–126)
Anion gap: 8 (ref 5–15)
BUN: 18 mg/dL (ref 8–23)
CO2: 25 mmol/L (ref 22–32)
Calcium: 7.6 mg/dL — ABNORMAL LOW (ref 8.9–10.3)
Chloride: 103 mmol/L (ref 98–111)
Creatinine, Ser: 0.78 mg/dL (ref 0.61–1.24)
GFR, Estimated: >60 mL/min (ref >60)
Glucose, Bld: 187 mg/dL — ABNORMAL HIGH (ref 70–99)
Potassium: 4 mmol/L (ref 3.5–5.1)
Sodium: 136 mmol/L (ref 135–145)
Total Bilirubin: 0.2 mg/dL — ABNORMAL LOW (ref 0.3–1.2)
Total Protein: 5.8 g/dL — ABNORMAL LOW (ref 6.5–8.1)

## 2020-12-24 LAB — MAGNESIUM: Magnesium: 2 mg/dL (ref 1.7–2.4)

## 2020-12-24 MED ORDER — INSULIN ASPART 100 UNIT/ML IJ SOLN
2.0000 [IU] | Freq: Once | INTRAMUSCULAR | Status: AC
  Administered 2020-12-24: 2 [IU] via SUBCUTANEOUS

## 2020-12-24 MED ORDER — FREE WATER
125.0000 mL | Freq: Three times a day (TID) | Status: DC
  Administered 2020-12-24 – 2020-12-28 (×12): 125 mL

## 2020-12-24 NOTE — Progress Notes (Addendum)
PROGRESS NOTE        PATIENT DETAILS Name: [name] [name] Age: 80 y.o. Sex: male Date of Birth: [birth year] Admit Date: 12/14/2020 Admitting Physician Georgette Shell, MD DDU:[id number], Arvid Right, MD  Brief Narrative: Patient is a 80 y.o. 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  See below for further details.    Subjective: Much more awake today-he is trying to verbalize.  Son at bedside claims that he ate around 10 bites of breakfast.  Tracks my movement.  Objective: Vitals: Blood pressure 120/70, pulse 76, temperature 98.1 F (36.7 C), temperature source Axillary, resp. rate 12, height 5\' 7"  (1.702 m), weight 83.1 kg, SpO2 97 %.   Exam: Gen Exam: More awake and alert-speaking very softly-hard to discern.   HEENT:atraumatic, normocephalic Chest: B/L clear to auscultation anteriorly CVS:S1S2 regular Abdomen:soft non tender, non distended Extremities:no edema Neurology: Left-sided hemiplegia persists. Skin: no rash   Pertinent Labs/Radiology: Na: 136 K: 4.0 creatinine: 0.78 AST/ALT: 111/161   uric acid: 2.0  10/10>>Blood culture: Enterococcus Enterococcus faecalis 10/10>> urine culture: Staff aureus 10/11>> blood culture: 1/2-Enterococcus faecalis 10/13>> blood culture: 1/2-Enterococcus faecalis 10/15>> blood culture: Staff epidermidis. 10/17>> blood culture: No growth  10/10>> CT head: Small right subdural hematoma 10/11>> TTE: EF-65-70%, no obvious vegetation. 10/14>>  TEE: Prosthetic mitral valve vegetation 10/14>> MRI brain: Acute infarct left cerebellar vermis, small subacute hematoma in the anterior right frontal lobe.  10/13-2010/14>> LTM EEG: No seizures.  Assessment/Plan: Acute metabolic encephalopathy: Due to hypernatremia/enterococcal bacteremia-mental status finally improving-he is much more awake and alert today compared to the past few days.  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  Repeat blood culture on 10/17 negative so far.  Remains on Rocephin/ampicillin-ID following will defer further to infectious disease.      Right wrist tenderness: Unclear etiology-no evidence of erythema swelling on exam.  Uric acid levels unremarkable.  X-ray suggestive of arthritis-better with supportive care and application of Voltaren gel.  Watch and follow.   Hypernatremia: Resolved with IVF-currently with NG tube feedings  Hyponatremia: Probably due to free water/NG tube feeds-improved after briefly discontinuing free water flushes and 1 dose of Lasix.  Change free water flushes to 3 times daily dosing..  Transaminitis: Suspect this is multifactorial from statin/Rocephin/underlying bacteremia.  Abdomen is soft.  Statin discontinued on 10/19-we will continue to follow LFTs for a few more days before considering further work-up.  PAF: Continue Tikosyn (pharmacy dosing)-remains on Eliquis.  Acute left cerebellar vermis CVA: Felt to be a septic emboli in the setting of mitral valve endocarditis.    History of seizure disorder: Initially on Keppra-given ongoing lethargy/encephalopathy-switched to Vimpat on 10/6.  LTM EEG negative for seizures.    Recent SDH May 2022: From signout from prior MD-Dr. 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  Continues to have significant left-sided hemiparesis.  On Eliquis.  History of rheumatic heart disease-s/p remote AVR/MVR (bioprosthetic valve)  Severe failure to thrive syndrome/frailty/poor oral intake/goals of care: Some improvement in mental status-starting to eat more orally over the past 2 days.  Continue NG tube feedings-reassess on 10/21-to see if he is eating enough to do a calorie count.  See goals of care discussion below.  Goals of care: Long discussion with spouse/2 sons on 10/20 by this MD, goals of care as follows. 1.  DNR  2.  Calorie count over the weekend-if mental/clinical status continues to improve-okay to proceed with PEG tube placement 3.  Apart from PEG tube placement-no further aggressive care.  Family aware that patient is not a candidate for redo valve replacement surgery etc.  Family wants to give it several weeks-to allow for further mental status/functional improvement.Family okay with being discharged to SNF with peg tube (if needed)/PICC line-and palliative care follow-up.  If while at SNF-if felt to have no significant improvement in quality of life or if he suffers a significant decompensation-family willing to transition to comfort measures then. 4.  Family aware that if in the interim (while hospitalized)-patient has significant decline/acute decompensation-organ damage/ICU level of care-family would be okay to transitioning to comfort measures in that event.  Nutrition Status: Nutrition Problem: Severe Malnutrition Etiology: chronic illness (CHF, Stroke) Signs/Symptoms: percent weight loss, moderate fat depletion, severe muscle depletion Percent weight loss: 12 % Interventions: Tube feeding, Prostat, MVI    Procedures:  None Consults: None DVT Prophylaxis: Eliquis Code Status:DNR Family Communication: [name]/son at bedside  Time spent: 35 minutes-Greater than 50% of this time was spent in counseling, explanation of diagnosis, planning of further management, and coordination of care.  Diet: Diet Order             DIET - DYS 1 Room service appropriate? Yes; Fluid consistency: Nectar Thick  Diet effective now                      Disposition Plan: Status is: Inpatient  Remains inpatient appropriate because:Inpatient level of care appropriate due to severity of illness  Dispo: The patient is from: SNF              Anticipated d/c is to: SNF              Patient currently is not medically stable to d/c.   Difficult to place patient No    Barriers to Discharge: Persistent encephalopathy-persistent bacteremia with Enterococcus-on IV antibiotics-NG tube feedings ongoing due to poor oral intake.  Antimicrobial agents: Anti-infectives (From admission, onward)    Start     Dose/Rate Route Frequency Ordered Stop   12/18/20 1500  cefTRIAXone (ROCEPHIN) 2 g in sodium chloride 0.9 % 100 mL IVPB        2 g 200 mL/hr over 30 Minutes Intravenous Every 12 hours 12/18/20 1344     12/15/20 1700  cefTRIAXone (ROCEPHIN) 1 g in sodium chloride 0.9 % 100 mL IVPB  Status:  Discontinued        1 g 200 mL/hr over 30 Minutes Intravenous Every 24 hours 12/14/20 1648 12/15/20 0624   12/15/20 0630  ampicillin (OMNIPEN) 2 g in sodium chloride 0.9 % 100 mL IVPB        2 g 300 mL/hr over 20 Minutes Intravenous Every 6 hours 12/15/20 0624  12/15/20 0630  gentamicin (GARAMYCIN) IVPB 80 mg        80 mg 100 mL/hr over 30 Minutes Intravenous  Once 12/15/20 0628 12/15/20 0732   12/14/20 1645  cefTRIAXone (ROCEPHIN) 1 g in sodium chloride 0.9 % 100 mL IVPB        1 g 200 mL/hr over 30 Minutes Intravenous  Once 12/14/20 1642 12/14/20 1830        MEDICATIONS: Scheduled Meds:  amantadine  200 mg Per Tube  BID   apixaban  5 mg Per Tube BID   diclofenac Sodium  4 g Topical QID   dofetilide  375 mcg Oral BID   feeding supplement (PROSource TF)  45 mL Per Tube Daily   finasteride  5 mg Oral Daily   free water  125 mL Per Tube Q8H   insulin aspart  0-9 Units Subcutaneous TID WC   lacosamide  50 mg Per Tube BID   magnesium gluconate  500 mg Per Tube Daily   mouth rinse  15 mL Mouth Rinse BID   multivitamin with minerals  1 tablet Per Tube Daily   sodium chloride flush  3 mL Intravenous Q12H   tamsulosin  0.4 mg Oral Daily   Continuous Infusions:  sodium chloride 10 mL/hr at 12/21/20 1204   sodium chloride     ampicillin (OMNIPEN) IV 2 g (12/24/20 1231)   cefTRIAXone (ROCEPHIN)  IV 2 g (12/24/20 0901)   feeding supplement (OSMOLITE 1.2 CAL) 1,000 mL (12/24/20 0558)   PRN Meds:.sodium chloride, sodium chloride flush   I have personally reviewed following labs and imaging studies  LABORATORY DATA: CBC: Recent Labs  Lab 12/19/20 0610 12/20/20 0153 12/21/20 0151 12/22/20 0224 12/23/20 0234  WBC 10.1 9.1 9.9 10.0 9.4  HGB 8.8* 8.7* 8.8* 8.8* 8.4*  HCT 27.6* 26.8* 26.6* 26.4* 25.9*  MCV 87.9 87.9 86.9 86.3 87.2  PLT 176 191 192 193 201     Basic Metabolic Panel: Recent Labs  Lab 12/18/20 1210 12/18/20 1630 12/19/20 0610 12/19/20 1632 12/20/20 0153 12/21/20 0151 12/22/20 0224 12/23/20 0234 12/24/20 0130  NA  --   --  139  --  137 135 132* 127* 136  K  --   --  3.5  --  4.2 4.0 4.1 3.9 4.0  CL  --   --  108  --  109 104 102 98 103  CO2  --   --  23  --  23 24 24 23 25   GLUCOSE  --   --  186*  --  190* 171* 209* 195* 187*  BUN  --   --  16  --  15 15 15 15 18   CREATININE  --   --  0.76  --  0.69 0.65 0.70 0.74 0.78  CALCIUM  --   --  7.7*  --  7.5* 7.5* 7.3* 7.1* 7.6*  MG 1.9 1.9 2.0 1.9 1.9  --  1.9 2.3 2.0  PHOS 2.8 3.1 2.7 2.4*  --   --   --   --   --      GFR: Estimated Creatinine Clearance: 78.5 mL/min (by C-G formula based on SCr of 0.78 mg/dL).  Liver  Function Tests: Recent Labs  Lab 12/20/20 0153 12/21/20 0151 12/22/20 0224 12/23/20 0234 12/24/20 0130  AST 60* 72* 100* 111* 111*  ALT 72* 86* 120* 139* 161*  ALKPHOS 61 69 74 79 85  BILITOT 0.5 0.3 0.4 0.4 0.2*  PROT 5.3* 5.5* 5.7*  5.6* 5.8*  ALBUMIN 1.6* 1.5* 1.5* <1.5* 1.5*    No results for input(s): LIPASE, AMYLASE in the last 168 hours. No results for input(s): AMMONIA in the last 168 hours.   Coagulation Profile: No results for input(s): INR, PROTIME in the last 168 hours.   Cardiac Enzymes: No results for input(s): CKTOTAL, CKMB, CKMBINDEX, TROPONINI in the last 168 hours.  BNP (last 3 results) No results for input(s): PROBNP in the last 8760 hours.  Lipid Profile: No results for input(s): CHOL, HDL, LDLCALC, TRIG, CHOLHDL, LDLDIRECT in the last 72 hours.  Thyroid Function Tests: No results for input(s): TSH, T4TOTAL, FREET4, T3FREE, THYROIDAB in the last 72 hours.   Anemia Panel: No results for input(s): VITAMINB12, FOLATE, FERRITIN, TIBC, IRON, RETICCTPCT in the last 72 hours.  Urine analysis:    Component Value Date/Time   COLORURINE YELLOW 12/14/2020 1600   APPEARANCEUR CLOUDY (A) 12/14/2020 1600   LABSPEC 1.020 12/14/2020 1600   PHURINE 6.0 12/14/2020 1600   GLUCOSEU NEGATIVE 12/14/2020 1600   HGBUR LARGE (A) 12/14/2020 1600   BILIRUBINUR NEGATIVE 12/14/2020 1600   BILIRUBINUR negative 11/22/2013 0848   KETONESUR NEGATIVE 12/14/2020 1600   PROTEINUR NEGATIVE 12/14/2020 1600   UROBILINOGEN 0.2 11/22/2013 0848   UROBILINOGEN 1.0 08/05/2013 2207   NITRITE POSITIVE (A) 12/14/2020 1600   LEUKOCYTESUR LARGE (A) 12/14/2020 1600    Sepsis Labs: Lactic Acid, Venous    Component Value Date/Time   LATICACIDVEN 1.8 12/14/2020 2056    MICROBIOLOGY: Recent Results (from the past 240 hour(s))  Resp Panel by RT-PCR (Flu A&B, Covid) Nasopharyngeal Swab     Status: None   Collection Time: 12/14/20  3:41 PM   Specimen: Nasopharyngeal Swab;  Nasopharyngeal(NP) swabs in vial transport medium  Result Value Ref Range Status   SARS Coronavirus 2 by RT PCR NEGATIVE NEGATIVE Final    Comment: (NOTE) SARS-CoV-2 target nucleic acids are NOT DETECTED.  The SARS-CoV-2 RNA is generally detectable in upper respiratory specimens during the acute phase of infection. The lowest concentration of SARS-CoV-2 viral copies this assay can detect is 138 copies/mL. A negative result does not preclude SARS-Cov-2 infection and should not be used as the sole basis for treatment or other patient management decisions. A negative result may occur with  improper specimen collection/handling, submission of specimen other than nasopharyngeal swab, presence of viral mutation(s) within the areas targeted by this assay, and inadequate number of viral copies(<138 copies/mL). A negative result must be combined with clinical observations, patient history, and epidemiological information. The expected result is Negative.  Fact Sheet for Patients:  EntrepreneurPulse.com.au  Fact Sheet for Healthcare Providers:  IncredibleEmployment.be  This test is no t yet approved or cleared by the [place] FDA and  has been authorized for detection and/or diagnosis of SARS-CoV-2 by FDA under an Emergency Use Authorization (EUA). This EUA will remain  in effect (meaning this test can be used) for the duration of the COVID-19 declaration under Section 564(b)(1) of the Act, 21 U.S.C.section 360bbb-3(b)(1), unless the authorization is terminated  or revoked sooner.       Influenza A by PCR NEGATIVE NEGATIVE Final   Influenza B by PCR NEGATIVE NEGATIVE Final    Comment: (NOTE) The Xpert Xpress SARS-CoV-2/FLU/RSV plus assay is intended as an aid in the diagnosis of influenza from Nasopharyngeal swab specimens and should not be used as a sole basis for treatment. Nasal washings and aspirates are unacceptable for Xpert Xpress  SARS-CoV-2/FLU/RSV testing.  Fact Sheet for Patients: EntrepreneurPulse.com.au  Fact Sheet for Healthcare Providers: IncredibleEmployment.be  This test is not yet approved or cleared by the [place] FDA and has been authorized for detection and/or diagnosis of SARS-CoV-2 by FDA under an Emergency Use Authorization (EUA). This EUA will remain in effect (meaning this test can be used) for the duration of the COVID-19 declaration under Section 564(b)(1) of the Act, 21 U.S.C. section 360bbb-3(b)(1), unless the authorization is terminated or revoked.  Performed at [facility], [place] [address], [place], [place] [address]   Culture, blood (Routine x 2)     Status: Abnormal   Collection Time: 12/14/20  4:39 PM   Specimen: BLOOD  Result Value Ref Range Status   Specimen Description BLOOD RIGHT HAND  Final   Special Requests   Final    BOTTLES DRAWN AEROBIC AND ANAEROBIC Blood Culture results may not be optimal due to an inadequate volume of blood received in culture bottles   Culture  Setup Time   Final    GRAM POSITIVE COCCI IN CHAINS IN BOTH AEROBIC AND ANAEROBIC BOTTLES CRITICAL VALUE NOTED.  VALUE IS CONSISTENT WITH PREVIOUSLY REPORTED AND CALLED VALUE.    Culture (A)  Final    ENTEROCOCCUS FAECALIS SUSCEPTIBILITIES PERFORMED ON PREVIOUS CULTURE WITHIN THE LAST 5 DAYS. Performed at [facility], [place] [address], [place], [place] [address]    Report Status 12/17/2020 FINAL  Final  Urine Culture     Status: Abnormal   Collection Time: 12/14/20  4:43 PM   Specimen: Urine, Clean Catch  Result Value Ref Range Status   Specimen Description URINE, CLEAN CATCH  Final   Special Requests   Final    NONE Performed at [facility], [place] [address], [place], [place] [address]    Culture >=100,000 COLONIES/mL STAPHYLOCOCCUS AUREUS (A)  Final   Report Status 12/17/2020 FINAL  Final   Organism ID, Bacteria STAPHYLOCOCCUS AUREUS (A)   Final      Susceptibility   Staphylococcus aureus - MIC*    CIPROFLOXACIN <=0.5 SENSITIVE Sensitive     GENTAMICIN <=0.5 SENSITIVE Sensitive     NITROFURANTOIN 32 SENSITIVE Sensitive     OXACILLIN <=0.25 SENSITIVE Sensitive     TETRACYCLINE <=1 SENSITIVE Sensitive     VANCOMYCIN <=0.5 SENSITIVE Sensitive     TRIMETH/SULFA <=10 SENSITIVE Sensitive     CLINDAMYCIN <=0.25 SENSITIVE Sensitive     RIFAMPIN <=0.5 SENSITIVE Sensitive     Inducible Clindamycin NEGATIVE Sensitive     * >=100,000 COLONIES/mL STAPHYLOCOCCUS AUREUS  Culture, blood (routine x 2)     Status: Abnormal   Collection Time: 12/15/20 11:20 AM   Specimen: BLOOD RIGHT ARM  Result Value Ref Range Status   Specimen Description BLOOD RIGHT ARM  Final   Special Requests   Final    BOTTLES DRAWN AEROBIC AND ANAEROBIC Blood Culture adequate volume   Culture  Setup Time   Final    GRAM POSITIVE COCCI ANAEROBIC BOTTLE ONLY CRITICAL VALUE NOTED.  VALUE IS CONSISTENT WITH PREVIOUSLY REPORTED AND CALLED VALUE.    Culture (A)  Final    ENTEROCOCCUS FAECALIS SUSCEPTIBILITIES PERFORMED ON PREVIOUS CULTURE WITHIN THE LAST 5 DAYS. Performed at [facility], [place] [address], [place], [place] [address]    Report Status 12/19/2020 FINAL  Final  Culture, blood (routine x 2)     Status: None   Collection Time: 12/15/20 11:25 AM   Specimen: BLOOD LEFT ARM  Result Value Ref Range Status  Specimen Description BLOOD LEFT ARM  Final   Special Requests   Final    BOTTLES DRAWN AEROBIC ONLY Blood Culture results may not be optimal due to an inadequate volume of blood received in culture bottles   Culture   Final    NO GROWTH 5 DAYS Performed at [facility], [place] [address], [place], [place] [address]    Report Status 12/20/2020 FINAL  Final  MRSA Next Gen by PCR, Nasal     Status: None   Collection Time: 12/16/20  4:34 AM   Specimen: Nasal Mucosa; Nasal Swab  Result Value Ref Range Status   MRSA by PCR Next Gen NOT  DETECTED NOT DETECTED Final    Comment: (NOTE) The GeneXpert MRSA Assay (FDA approved for NASAL specimens only), is one component of a comprehensive MRSA colonization surveillance program. It is not intended to diagnose MRSA infection nor to guide or monitor treatment for MRSA infections. Test performance is not FDA approved in patients less than 79 years old. Performed at [facility], [place] [address] [place], [place] [address]   Culture, blood (routine x 2)     Status: Abnormal   Collection Time: 12/17/20  8:01 AM   Specimen: BLOOD RIGHT HAND  Result Value Ref Range Status   Specimen Description BLOOD RIGHT HAND  Final   Special Requests   Final    BOTTLES DRAWN AEROBIC AND ANAEROBIC Blood Culture adequate volume   Culture  Setup Time   Final    GRAM POSITIVE COCCI IN CHAINS ANAEROBIC BOTTLE ONLY CRITICAL VALUE NOTED.  VALUE IS CONSISTENT WITH PREVIOUSLY REPORTED AND CALLED VALUE.    Culture (A)  Final    ENTEROCOCCUS FAECALIS SUSCEPTIBILITIES PERFORMED ON PREVIOUS CULTURE WITHIN THE LAST 5 DAYS. Performed at [facility], [place] [address] [place], [place] [address]    Report Status 12/20/2020 FINAL  Final  Culture, blood (routine x 2)     Status: Abnormal   Collection Time: 12/17/20  8:03 AM   Specimen: BLOOD RIGHT WRIST  Result Value Ref Range Status   Specimen Description BLOOD RIGHT WRIST  Final   Special Requests   Final    BOTTLES DRAWN AEROBIC AND ANAEROBIC Blood Culture adequate volume   Culture  Setup Time   Final    GRAM POSITIVE COCCI ANAEROBIC BOTTLE ONLY CRITICAL VALUE NOTED.  VALUE IS CONSISTENT WITH PREVIOUSLY REPORTED AND CALLED VALUE.    Culture (A)  Final    ENTEROCOCCUS FAECALIS SUSCEPTIBILITIES PERFORMED ON PREVIOUS CULTURE WITHIN THE LAST 5 DAYS. Performed at [facility], [place] [address], [place], [place] [address]    Report Status 12/23/2020 FINAL  Final  Culture, blood (routine x 2)     Status: None   Collection Time: 12/19/20   7:46 AM   Specimen: BLOOD  Result Value Ref Range Status   Specimen Description BLOOD RIGHT ANTECUBITAL  Final   Special Requests   Final    BOTTLES DRAWN AEROBIC AND ANAEROBIC Blood Culture adequate volume   Culture   Final    NO GROWTH 5 DAYS Performed at [facility], [place] [address], [place], [place] [address]    Report Status 12/24/2020 FINAL  Final  Culture, blood (routine x 2)     Status: Abnormal   Collection Time: 12/19/20  7:47 AM   Specimen: BLOOD  Result Value Ref Range Status   Specimen Description BLOOD LEFT ANTECUBITAL  Final   Special Requests  Final    BOTTLES DRAWN AEROBIC AND ANAEROBIC Blood Culture adequate volume   Culture  Setup Time   Final    GRAM POSITIVE COCCI AEROBIC BOTTLE ONLY CRITICAL VALUE NOTED.  VALUE IS CONSISTENT WITH PREVIOUSLY REPORTED AND CALLED VALUE.    Culture (A)  Final    STAPHYLOCOCCUS EPIDERMIDIS THE SIGNIFICANCE OF ISOLATING THIS ORGANISM FROM A SINGLE SET OF BLOOD CULTURES WHEN MULTIPLE SETS ARE DRAWN IS UNCERTAIN. PLEASE NOTIFY THE MICROBIOLOGY DEPARTMENT WITHIN ONE WEEK IF SPECIATION AND SENSITIVITIES ARE REQUIRED. Performed at [facility], [place] [address], [place], [place] [address]    Report Status 12/23/2020 FINAL  Final  Culture, blood (routine x 2)     Status: None (Preliminary result)   Collection Time: 12/21/20  6:32 PM   Specimen: BLOOD RIGHT WRIST  Result Value Ref Range Status   Specimen Description BLOOD RIGHT WRIST  Final   Special Requests   Final    BOTTLES DRAWN AEROBIC AND ANAEROBIC Blood Culture adequate volume   Culture   Final    NO GROWTH 3 DAYS Performed at [facility], [place] [address], [place], [place] [address]    Report Status PENDING  Incomplete  Culture, blood (routine x 2)     Status: None (Preliminary result)   Collection Time: 12/21/20  6:40 PM   Specimen: BLOOD  Result Value Ref Range Status   Specimen Description BLOOD LEFT ANTECUBITAL  Final   Special Requests   Final     BOTTLES DRAWN AEROBIC AND ANAEROBIC Blood Culture adequate volume   Culture   Final    NO GROWTH 3 DAYS Performed at [facility], [place] [address], [place], [place] [address]    Report Status PENDING  Incomplete    RADIOLOGY STUDIES/RESULTS: No results found.   LOS: 9 days   Oren Binet, MD  [facility]    To contact the attending provider between 7A-7P or the covering provider during after hours 7P-7A, please log into the web site [email or web address] and access using universal [place] password for that web site. If you do not have the password, please call the hospital operator.  12/24/2020, 2:13 PM

## 2020-12-24 NOTE — Progress Notes (Signed)
STROKE TEAM PROGRESS NOTE   SUBJECTIVE (INTERVAL HISTORY) His wife is at the bedside. Pt lying in bed, eyes open, has active eye contact, however nonverbal.  Per wife, patient today is more awake alert, ate well for breakfast and lunch, did not talk much though.  Family requested continued care with potential PEG tube if needed.  Plan to do calorie count and PEG tube next week if needed.   OBJECTIVE Temp:  [98.1 F (36.7 C)-99.4 F (37.4 C)] 99.4 F (37.4 C) (10/20 1948) Pulse Rate:  [73-83] 83 (10/20 1948) Cardiac Rhythm: Heart block (10/20 0716) Resp:  [12-22] 20 (10/20 1948) BP: (101-120)/(66-73) 115/69 (10/20 1948) SpO2:  [95 %-98 %] 96 % (10/20 1948) Weight:  [83.1 kg] 83.1 kg (10/20 0613)  Recent Labs  Lab 12/24/20 0338 12/24/20 0739 12/24/20 1253 12/24/20 1729 12/24/20 1952  GLUCAP 134* 175* 162* 192* 234*   Recent Labs  Lab 12/18/20 1210 12/18/20 1630 12/19/20 0610 12/19/20 1632 12/20/20 0153 12/21/20 0151 12/22/20 0224 12/23/20 0234 12/24/20 0130  NA  --   --  139  --  137 135 132* 127* 136  K  --   --  3.5  --  4.2 4.0 4.1 3.9 4.0  CL  --   --  108  --  109 104 102 98 103  CO2  --   --  23  --  23 24 24 23 25   GLUCOSE  --   --  186*  --  190* 171* 209* 195* 187*  BUN  --   --  16  --  15 15 15 15 18   CREATININE  --   --  0.76  --  0.69 0.65 0.70 0.74 0.78  CALCIUM  --   --  7.7*  --  7.5* 7.5* 7.3* 7.1* 7.6*  MG 1.9 1.9 2.0 1.9 1.9  --  1.9 2.3 2.0  PHOS 2.8 3.1 2.7 2.4*  --   --   --   --   --    Recent Labs  Lab 12/20/20 0153 12/21/20 0151 12/22/20 0224 12/23/20 0234 12/24/20 0130  AST 60* 72* 100* 111* 111*  ALT 72* 86* 120* 139* 161*  ALKPHOS 61 69 74 79 85  BILITOT 0.5 0.3 0.4 0.4 0.2*  PROT 5.3* 5.5* 5.7* 5.6* 5.8*  ALBUMIN 1.6* 1.5* 1.5* <1.5* 1.5*   Recent Labs  Lab 12/19/20 0610 12/20/20 0153 12/21/20 0151 12/22/20 0224 12/23/20 0234  WBC 10.1 9.1 9.9 10.0 9.4  HGB 8.8* 8.7* 8.8* 8.8* 8.4*  HCT 27.6* 26.8* 26.6* 26.4* 25.9*   MCV 87.9 87.9 86.9 86.3 87.2  PLT 176 191 192 193 201   No results for input(s): CKTOTAL, CKMB, CKMBINDEX, TROPONINI in the last 168 hours. No results for input(s): LABPROT, INR in the last 72 hours. No results for input(s): COLORURINE, LABSPEC, [place], GLUCOSEU, HGBUR, BILIRUBINUR, KETONESUR, PROTEINUR, UROBILINOGEN, NITRITE, LEUKOCYTESUR in the last 72 hours.  Invalid input(s): APPERANCEUR     Component Value Date/Time   CHOL 119 08/08/2020 0625   CHOL 102 11/26/2013 0000   TRIG 105 08/08/2020 0625   TRIG 93 11/26/2013 0000   HDL 42 08/08/2020 0625   CHOLHDL 2.8 08/08/2020 0625   VLDL 21 08/08/2020 0625   LDLCALC 56 08/08/2020 0625   LDLCALC 58 11/26/2013 0000   Lab Results  Component Value Date   HGBA1C 7.3 (H) 12/17/2020      Component Value Date/Time   LABOPIA NONE DETECTED 08/05/2013 2207   COCAINSCRNUR NONE  DETECTED 08/05/2013 2207   LABBENZ NONE DETECTED 08/05/2013 2207   AMPHETMU NONE DETECTED 08/05/2013 2207   THCU NONE DETECTED 08/05/2013 2207   LABBARB NONE DETECTED 08/05/2013 2207    No results for input(s): ETH in the last 168 hours.  I have personally reviewed the radiological images below and agree with the radiology interpretations.  DG Wrist 2 Views Right  Result Date: 12/22/2020 CLINICAL DATA:  Tenderness with movement. EXAM: RIGHT WRIST - 2 VIEW COMPARISON:  None. FINDINGS: Radiocarpal joint appears within normal limits. There are degenerative changes the articulation between the scaphoid and the multangular bones and at the first and second carpometacarpal articulations. No sign of fracture. Regional arterial calcification incidentally noted. IMPRESSION: Radial side degenerative changes as described above which could certainly be painful. Electronically Signed   By: Nelson Chimes M.D.   On: 12/22/2020 13:51   CT Head Wo Contrast  Result Date: 12/14/2020 CLINICAL DATA:  Mental status change, unknown cause EXAM: CT HEAD WITHOUT CONTRAST TECHNIQUE:  Contiguous axial images were obtained from the base of the skull through the vertex without intravenous contrast. 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 Old left parietal infarct. Findings are stable since prior study. Small right subdural hematoma again noted overlying the right frontoparietal lobe, 3 mm compared to 5 mm previously. There is atrophy and chronic small vessel disease changes. Ventriculomegaly likely rib related to ex vacuo dilatation. No acute infarct or acute hemorrhage. Vascular: No hyperdense vessel or unexpected calcification. Skull: Prior right temporoparietal craniotomy. No acute calvarial abnormality. Sinuses/Orbits: No acute findings Other: None IMPRESSION: Small right subdural hematoma, 3 mm compared with 5 mm previously. No new hemorrhage. Old right MCA and left parietal infarcts, stable. Ventriculomegaly related to ex vacuo dilatation. Atrophy, chronic microvascular disease. No acute intracranial abnormality. Electronically Signed   By: Rolm Baptise M.D.   On: 12/14/2020 13:50   MR BRAIN WO CONTRAST  Result Date: 12/18/2020 CLINICAL DATA:  Mental status change, unknown cause EXAM: MRI HEAD WITHOUT CONTRAST TECHNIQUE: Multiplanar, multiecho pulse sequences of the brain and surrounding structures were obtained without intravenous contrast. COMPARISON:  08/29/2020 FINDINGS: Motion artifact is present. Brain: Small focus of reduced diffusion is present within the left cerebellar vermis. Diffusion hyperintensity in the region of prior right MCA infarct is probably artifactual. There is a 1.2 cm subacute hematoma of the anterior right superior frontal gyrus (series 6, image 18). Chronic right MCA territory infarction with chronic blood products. Ex vacuo dilatation of the adjacent right lateral ventricle. Chronic infarct on the left near junction of parietal, temporal, and occipital lobes also with chronic blood products. Additional confluent  T2 hyperintensity in the supratentorial white matter is nonspecific but may reflect chronic microvascular ischemic changes. Disproportionate prominence of the lateral and third ventricles is unchanged. Thin extra-axial collection underlies craniotomy. Vascular: Major vessel flow voids at the skull base are preserved. Skull and upper cervical spine: Normal marrow signal is preserved. Prior right craniotomy. Sinuses/Orbits: Minor mucosal thickening. No acute orbital abnormality. Other: Sella is unremarkable.  Mastoid air cells are clear. IMPRESSION: Small acute infarct left cerebellar vermis. Small subacute hematoma anterior right frontal lobe (isodense on CT). Stable chronic findings of infarcts, volume loss, and chronic microvascular ischemic changes. Electronically Signed   By: Macy Mis M.D.   On: 12/18/2020 12:49   DG Chest Port 1 View  Result Date: 12/14/2020 CLINICAL DATA:  Altered mental status EXAM: PORTABLE CHEST 1 VIEW COMPARISON:  Chest radiograph 08/07/2020 FINDINGS: Median  sternotomy wires and mitral and aortic valve prostheses are stable. The cardiomediastinal silhouette is stable. There is calcified atherosclerotic plaque of the aortic arch. Lung volumes are low. There is no focal consolidation or pulmonary edema. There is no pleural effusion or pneumothorax. There is no acute osseous abnormality. IMPRESSION: Low lung volumes. Otherwise, no radiographic evidence of acute cardiopulmonary process. Electronically Signed   By: Valetta Mole M.D.   On: 12/14/2020 13:01   DG Abd Portable 1V  Result Date: 12/18/2020 CLINICAL DATA:  Feeding tube placement EXAM: PORTABLE ABDOMEN - 1 VIEW COMPARISON:  09/01/2020 FINDINGS: Feeding tube terminates at the distal stomach. Cardiomegaly with median sternotomy and left base airspace disease. Non-obstructive bowel gas pattern. No gross free intraperitoneal air. IMPRESSION: Feeding tube terminating at the distal stomach. Electronically Signed   By: Abigail Miyamoto M.D.   On: 12/18/2020 16:44   EEG adult  Result Date: 12/17/2020 Lora Havens, MD     12/17/2020  1:00 PM Patient Name: [name] [name] MRN: [id number] Epilepsy Attending: Lora Havens Referring Physician/Provider: Dr Oren Binet Date: 12/17/2020 Duration: 24.04 mins Patient history: 80 year old male with recent subdural hematoma in May, seizures who presented with altered mental status.  EEG to evaluate for seizures. Level of alertness: Awake AEDs during EEG study: LEV Technical aspects: This EEG study was done with scalp electrodes positioned according to the 10-20 International system of electrode placement. Electrical activity was acquired at a sampling rate of 500Hz  and reviewed with a high frequency filter of 70Hz  and a low frequency filter of 1Hz . EEG data were recorded continuously and digitally stored. Description: The posterior dominant rhythm consists of 8-9 Hz activity of moderate voltage (25-35 uV) seen predominantly in posterior head regions, symmetric and reactive to eye opening and eye closing. EEG showed continuous low amplitude 2 to 3 Hz delta slowing in right frontotemporal region. Intermittent generalized 3 to 5 Hz theta-delta slowing was also noted. Hyperventilation and photic stimulation were not performed.   ABNORMALITY - Continuous slow, right frontotemporal region - Intermittent slow, generalized IMPRESSION: This study is suggestive of cortical dysfunction in right frontotemporal region likely secondary to underlying structural abnormality/stroke/subdural hemorrhage.  Additionally there is mild diffuse encephalopathy, nonspecific etiology.  No seizures or epileptiform discharges were seen throughout the recording.  If suspicion for ictal- interictal activity remains a concern, a prolonged study can be considered. Priyanka Barbra Sarks   Overnight EEG with video  Result Date: 12/18/2020 Lora Havens, MD     12/19/2020  7:36 AM Patient Name: DRAYLEN LOBUE MRN:  [id number] Epilepsy Attending: Lora Havens Referring Physician/Provider: Dr Oren Binet Duration: 12/17/2020 1638 to 12/18/2020 1002  Patient history: 80 year old male with recent subdural hematoma in May, seizures who presented with altered mental status.  EEG to evaluate for seizures.  Level of alertness: Awake, asleep  AEDs during EEG study: LEV  Technical aspects: This EEG study was done with scalp electrodes positioned according to the 10-20 International system of electrode placement. Electrical activity was acquired at a sampling rate of 500Hz  and reviewed with a high frequency filter of 70Hz  and a low frequency filter of 1Hz . EEG data were recorded continuously and digitally stored.  Description: The posterior dominant rhythm consists of 8-9 Hz activity of moderate voltage (25-35 uV) seen predominantly in posterior head regions, symmetric and reactive to eye opening and eye closing.  Sleep was characterized by vertex waves, sleep spindles (12 to 14 Hz), maximal frontocentral region.  EEG showed continuous low  amplitude 2 to 3 Hz delta slowing in right frontotemporal region. Hyperventilation and photic stimulation were not performed.    ABNORMALITY - Continuous slow, right frontotemporal region  IMPRESSION: This study is suggestive of cortical dysfunction in right frontotemporal region likely secondary to underlying structural abnormality/stroke/subdural hemorrhage. No seizures or epileptiform discharges were seen throughout the recording. Lora Havens   ECHOCARDIOGRAM COMPLETE  Result Date: 12/15/2020    ECHOCARDIOGRAM REPORT   Patient Name:   Fenix DONIEL MAIELLO Date of Exam: 12/15/2020 Medical Rec #:  [id number]     Height:       67.0 in Accession #:    [id number]    Weight:       166.2 lb Date of Birth:  Aug 04, 1940     BSA:          1.869 m Patient Age:    80 years      BP:           120/67 mmHg Patient Gender: M             HR:           98 bpm. Exam Location:  Inpatient Procedure: 2D Echo,  Cardiac Doppler, Color Doppler and Intracardiac            Opacification Agent Indications:    Fever R50.9  History:        Patient has prior history of Echocardiogram examinations, most                 recent 08/07/2020. Stroke; Arrythmias:Atrial Fibrillation. Past                 history of rheumatic fever, acute rheumatic pericarditis.                 Thoracic aortic aneurysm.                 Aortic Valve: 27 mm Carpentier-Edwards prosthetic valve is                 present in the aortic position. Procedure Date: 10/18/2004.  Sonographer:    Darlina Sicilian RDCS Sonographer#2:  Darlina Sicilian RDCS Referring Phys: [id number] [place]  1. The mitral valve has been replaced with an unknown bioprosthesis. No evidence of mitral valve regurgitation. The mean mitral valve gradient is 10.0 mmHg with average heart rate of 101 bpm. PHT 70 ms. Suboptimal LVOT spectral Doppler- VTI deferred. Date of Procedure Date: 10/18/2004.  2. The aortic valve has been repaired/replaced. Aortic valve regurgitation is not visualized. 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 Procedure Date: 10/18/2004. Aortic valve mean gradient measures 16.0 mmHg.  Aortic valve acceleration time measures 74 msec.  3. Left ventricular ejection fraction, by estimation, is 65 to 70%. The left ventricle has normal function. The left ventricle has no regional wall motion abnormalities. There is mild concentric left ventricular hypertrophy. Left ventricular diastolic parameters are indeterminate.  4. Aortic dilatation noted. 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  5. Right ventricular systolic function is normal. The right ventricular size is normal. Comparison(s): A prior study was performed on 08/07/2020. Prior images reviewed side by side. Stable aortic root and ascending aorta size. Increased mitral  valve gradients, but at higher hear rates from prior. FINDINGS  Left Ventricle: Left ventricular ejection fraction, by estimation, is 65 to 70%. The  left ventricle has normal function. The left ventricle has no regional wall motion abnormalities. Definity contrast agent was given IV to delineate the left ventricular  endocardial borders. The left ventricular internal cavity size was normal in size. There is mild concentric left ventricular hypertrophy. Left ventricular diastolic parameters are indeterminate. Right Ventricle: The right ventricular size is normal. No increase in right ventricular wall thickness. Right ventricular systolic function is normal. Left Atrium: Left atrial size was normal in size. Right Atrium: Right atrial size was normal in size. Pericardium: There is no evidence of pericardial effusion. Mitral Valve: The mitral valve has been repaired/replaced. No evidence of mitral valve regurgitation. There is a 29 mm Carpentier-Edwards prosthetic present in the mitral position. MV peak gradient, 17.1 mmHg. The mean mitral valve gradient is 10.0 mmHg with average heart rate of 101 bpm. Tricuspid Valve: The tricuspid valve is grossly normal. Tricuspid valve regurgitation is mild . No evidence of tricuspid stenosis. Aortic Valve: The aortic valve has been repaired/replaced. Aortic valve regurgitation is not visualized. Aortic valve mean gradient measures 16.0 mmHg. Aortic valve peak gradient measures 24.4 mmHg. Aortic valve area, by VTI measures 1.56 cm. There is a  27 mm Carpentier-Edwards prosthetic valve present in the aortic position. Procedure Date: 10/18/2004. Pulmonic Valve: The pulmonic valve was normal in structure. Pulmonic valve regurgitation is mild. No evidence of pulmonic stenosis. Aorta: The ascending aorta was not well visualized and aortic dilatation noted. There is moderate dilatation of the aortic root, measuring 48 mm. There is moderate dilatation of the ascending aorta, measuring 49  mm. IAS/Shunts: The atrial septum is grossly normal.  LEFT VENTRICLE PLAX 2D LVIDd:         4.80 cm LVIDs:         3.50 cm LV PW:         1.30 cm LV IVS:        1.30 cm LVOT diam:     3.25 cm LV SV:         62 LV SV Index:   33 LVOT Area:     8.30 cm  LV Volumes (MOD) LV vol d, MOD A2C: 81.2 ml LV vol d, MOD A4C: 94.4 ml LV vol s, MOD A2C: 18.0 ml LV vol s, MOD A4C: 30.1 ml LV SV MOD A2C:     63.2 ml LV SV MOD A4C:     94.4 ml LV SV MOD BP:      63.9 ml RIGHT VENTRICLE RV S prime:     10.40 cm/s LEFT ATRIUM             Index        RIGHT ATRIUM          Index LA diam:        4.50 cm 2.41 cm/m   RA Area:     8.74 cm LA Vol (A2C):   58.1 ml 31.08 ml/m  RA Volume:   12.60 ml 6.74 ml/m LA Vol (A4C):   56.9 ml 30.44 ml/m LA Biplane Vol: 58.9 ml 31.51 ml/m  AORTIC VALVE AV Area (Vmax):    5.29 cm AV Area (Vmean):   1.55 cm AV Area (VTI):     1.56 cm AV Vmax:           247.00 cm/s AV Vmean:          190.500 cm/s AV VTI:            0.401 m AV Peak Grad:  24.4 mmHg AV Mean Grad:      16.0 mmHg LVOT Vmax:         157.40 cm/s LVOT Vmean:        35.700 cm/s LVOT VTI:          0.075 m LVOT/AV VTI ratio: 0.19  AORTA Ao Asc diam: 4.90 cm MITRAL VALVE                TRICUSPID VALVE MV Area (PHT): 3.16 cm     TR Peak grad:   26.4 mmHg MV Area VTI:   1.39 cm     TR Vmax:        257.00 cm/s MV Peak grad:  17.1 mmHg MV Mean grad:  10.0 mmHg    SHUNTS MV Vmax:       2.07 m/s     Systemic VTI:  0.08 m MV Vmean:      151.0 cm/s   Systemic Diam: 3.25 cm MV Decel Time: 240 msec MV E velocity: 195.00 cm/s Rudean Haskell MD Electronically signed by Rudean Haskell MD Signature Date/Time: 12/15/2020/12:49:20 PM    Final       PHYSICAL EXAM  Temp:  [98.1 F (36.7 C)-99.4 F (37.4 C)] 99.4 F (37.4 C) (10/20 1948) Pulse Rate:  [73-83] 83 (10/20 1948) Resp:  [12-22] 20 (10/20 1948) BP: (101-120)/(66-73) 115/69 (10/20 1948) SpO2:  [95 %-98 %] 96 % (10/20 1948) Weight:  [83.1 kg] 83.1 kg (10/20  3893)  General - Well nourished, well developed, in no apparent distress.  Ophthalmologic - fundi not visualized due to noncooperation.  Cardiovascular - Regular rhythm and rate, not in afib.  Neuro - eyes open, turned head towards voice and able to maintain eyes contact.  Still nonverbal, not follow commands.  Able to blinking spontaneous, however not blinking to visual threat bilaterally.  No tracking.  Not interactive.  Mild left facial droop.  Tongue protrusion not corporative.  Left upper and lower extremity hemiplegia except left lower extremity slight withdraw with pain stimulation.  Right upper extremity localizing to pain, purposeful barely against gravity.  Right lower extremity no spontaneous movement, however withdrawal to pain 2/5. Sensation, coordination and gait not tested.   ASSESSMENT/PLAN Mr. KURT HOFFMEIER is a 80 y.o. 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 fib status post maze procedure and LAA clipping, stroke admitted for lethargy, weight loss, bacteremia and altered mental status. No tPA given due to outside window.    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 If PEG needed, may need heparin IV for transition Ongoing aggressive stroke risk factor management Therapy recommendations: Pending Disposition: Pending  Bacteremia Endocarditis UTI TEE showed small mitral wall vegetation Blood culture positive for Enterococcus->repeat Cx 10/13 1/2 positive->repeat Cx 10/15 again 1/2 positive but likely  contamination  Blood Cx 10/17 NGTD Urine culture positive for staph aureus Currently on ampicillin and Rocephin ID on board   PAF Status post maze and left AA clipping Was on Coumadin before Now on Eliquis and Tikosyn Cardiology on board  History of stroke, SDH and seizure Stroke in 08/2013 07/27/2020 admitted for traumatic right large SDH status post bur hole evacuation.  Coumadin was on hold due to SDH 08/07/2020 admitted for altered mental status with left-sided weakness, CT showed right MCA large infarct.  CTA head and neck right ICA and right M2 occlusion status post IR with TICI2c revascularization.  EF 60 to 65%, LDL 56, A1c 6.2, patient discharged to [facility].  Started Eliquis on 08/25/2020 in [facility]. 08/28/2020 had a seizure activity, MRI no significant change, put on Keppra. Current admission, EEG and long-term EEG no seizure.  Diabetes HgbA1c 7.3 goal < 7.0 Uncontrolled CBG monitoring SSI DM education and close PCP follow up  Hypotension On the low end BP goal < 160 Long term BP goal normotensive  Hyperlipidemia Home meds: Lipitor 80 LDL 56 in 08/2020, goal < 70 Hold off now due to elevated LFTs Consider to resume statin once LFTs normalize  Dysphagia On tube feeding through [place] Family requested PEG tube if needed Will do caloric count and PEG tube next week and repeated Consider heparin IV transition Eliquis for PEG tube placement if needed.  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 After hours, contact General Neurology

## 2020-12-24 NOTE — Progress Notes (Signed)
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  QTc ok at 488 msec.  Can continue current Tikosyn dosing.   No changes from my perspective, continue antibiotics.  10/15 cultures with S epidermidis, likely contaminant.   Loralie Champagne 12/24/2020

## 2020-12-25 DIAGNOSIS — E43 Unspecified severe protein-calorie malnutrition: Secondary | ICD-10-CM | POA: Diagnosis not present

## 2020-12-25 DIAGNOSIS — T826XXD Infection and inflammatory reaction due to cardiac valve prosthesis, subsequent encounter: Secondary | ICD-10-CM | POA: Diagnosis not present

## 2020-12-25 DIAGNOSIS — I330 Acute and subacute infective endocarditis: Secondary | ICD-10-CM | POA: Diagnosis not present

## 2020-12-25 DIAGNOSIS — Z9889 Other specified postprocedural states: Secondary | ICD-10-CM | POA: Diagnosis not present

## 2020-12-25 DIAGNOSIS — Z8673 Personal history of transient ischemic attack (TIA), and cerebral infarction without residual deficits: Secondary | ICD-10-CM | POA: Diagnosis not present

## 2020-12-25 DIAGNOSIS — R7881 Bacteremia: Secondary | ICD-10-CM | POA: Diagnosis not present

## 2020-12-25 LAB — COMPREHENSIVE METABOLIC PANEL
ALT: 151 U/L — ABNORMAL HIGH (ref 0–44)
AST: 95 U/L — ABNORMAL HIGH (ref 15–41)
Albumin: 1.6 g/dL — ABNORMAL LOW (ref 3.5–5.0)
Alkaline Phosphatase: 90 U/L (ref 38–126)
Anion gap: 7 (ref 5–15)
BUN: 21 mg/dL (ref 8–23)
CO2: 25 mmol/L (ref 22–32)
Calcium: 7.7 mg/dL — ABNORMAL LOW (ref 8.9–10.3)
Chloride: 103 mmol/L (ref 98–111)
Creatinine, Ser: 0.75 mg/dL (ref 0.61–1.24)
GFR, Estimated: >60 mL/min (ref >60)
Glucose, Bld: 161 mg/dL — ABNORMAL HIGH (ref 70–99)
Potassium: 4.1 mmol/L (ref 3.5–5.1)
Sodium: 135 mmol/L (ref 135–145)
Total Bilirubin: 0.5 mg/dL (ref 0.3–1.2)
Total Protein: 5.8 g/dL — ABNORMAL LOW (ref 6.5–8.1)

## 2020-12-25 LAB — GLUCOSE, CAPILLARY
Glucose-Capillary: 125 mg/dL — ABNORMAL HIGH (ref 70–99)
Glucose-Capillary: 154 mg/dL — ABNORMAL HIGH (ref 70–99)
Glucose-Capillary: 163 mg/dL — ABNORMAL HIGH (ref 70–99)
Glucose-Capillary: 183 mg/dL — ABNORMAL HIGH (ref 70–99)
Glucose-Capillary: 185 mg/dL — ABNORMAL HIGH (ref 70–99)
Glucose-Capillary: 220 mg/dL — ABNORMAL HIGH (ref 70–99)

## 2020-12-25 LAB — MAGNESIUM: Magnesium: 2 mg/dL (ref 1.7–2.4)

## 2020-12-25 MED ORDER — OSMOLITE 1.2 CAL PO LIQD
900.0000 mL | ORAL | Status: DC
  Administered 2020-12-25 – 2020-12-27 (×3): 900 mL
  Filled 2020-12-25 (×6): qty 948

## 2020-12-25 MED ORDER — HEPARIN (PORCINE) 25000 UT/250ML-% IV SOLN
1350.0000 [IU]/h | INTRAVENOUS | Status: DC
  Administered 2020-12-25: 1100 [IU]/h via INTRAVENOUS
  Administered 2020-12-26 – 2020-12-28 (×3): 1200 [IU]/h via INTRAVENOUS
  Administered 2020-12-29 – 2020-12-31 (×4): 1250 [IU]/h via INTRAVENOUS
  Filled 2020-12-25 (×8): qty 250

## 2020-12-25 MED ORDER — SODIUM CHLORIDE 0.9 % IV SOLN
2.0000 g | INTRAVENOUS | Status: DC
  Administered 2020-12-25 – 2021-01-05 (×65): 2 g via INTRAVENOUS
  Filled 2020-12-25 (×67): qty 2000

## 2020-12-25 MED ORDER — ENSURE ENLIVE PO LIQD
237.0000 mL | Freq: Two times a day (BID) | ORAL | Status: DC
  Administered 2020-12-25 – 2020-12-28 (×3): 237 mL via ORAL

## 2020-12-25 NOTE — Progress Notes (Signed)
STROKE TEAM PROGRESS NOTE   SUBJECTIVE (INTERVAL HISTORY) His male family friend is at the bedside. Pt sleeping on my arrival but arousable on voice, open eyes, no significant eye contact, but when I asked him "how are you doing". He was able to say "so far" twice. But no other language outpt.   OBJECTIVE Temp:  [98.1 F (36.7 C)-99.7 F (37.6 C)] 98.4 F (36.9 C) (10/21 1152) Pulse Rate:  [74-83] 74 (10/21 1152) Cardiac Rhythm: Heart block (10/21 0904) Resp:  [12-20] 20 (10/21 1152) BP: (108-131)/(69-77) 116/69 (10/21 1152) SpO2:  [94 %-97 %] 96 % (10/21 1152) Weight:  [82.1 kg] 82.1 kg (10/21 0500)  Recent Labs  Lab 12/24/20 1952 12/25/20 0002 12/25/20 0358 12/25/20 0756 12/25/20 1157  GLUCAP 234* 154* 183* 185* 220*   Recent Labs  Lab 12/18/20 1630 12/18/20 1630 12/19/20 0610 12/19/20 1632 12/20/20 0153 12/21/20 0151 12/22/20 0224 12/23/20 0234 12/24/20 0130 12/25/20 0058  NA  --    < > 139  --  137 135 132* 127* 136 135  K  --    < > 3.5  --  4.2 4.0 4.1 3.9 4.0 4.1  CL  --    < > 108  --  109 104 102 98 103 103  CO2  --    < > 23  --  23 24 24 23 25 25   GLUCOSE  --    < > 186*  --  190* 171* 209* 195* 187* 161*  BUN  --    < > 16  --  15 15 15 15 18 21   CREATININE  --    < > 0.76  --  0.69 0.65 0.70 0.74 0.78 0.75  CALCIUM  --    < > 7.7*  --  7.5* 7.5* 7.3* 7.1* 7.6* 7.7*  MG 1.9  --  2.0 1.9 1.9  --  1.9 2.3 2.0 2.0  PHOS 3.1  --  2.7 2.4*  --   --   --   --   --   --    < > = values in this interval not displayed.   Recent Labs  Lab 12/21/20 0151 12/22/20 0224 12/23/20 0234 12/24/20 0130 12/25/20 0058  AST 72* 100* 111* 111* 95*  ALT 86* 120* 139* 161* 151*  ALKPHOS 69 74 79 85 90  BILITOT 0.3 0.4 0.4 0.2* 0.5  PROT 5.5* 5.7* 5.6* 5.8* 5.8*  ALBUMIN 1.5* 1.5* <1.5* 1.5* 1.6*   Recent Labs  Lab 12/19/20 0610 12/20/20 0153 12/21/20 0151 12/22/20 0224 12/23/20 0234  WBC 10.1 9.1 9.9 10.0 9.4  HGB 8.8* 8.7* 8.8* 8.8* 8.4*  HCT 27.6* 26.8*  26.6* 26.4* 25.9*  MCV 87.9 87.9 86.9 86.3 87.2  PLT 176 191 192 193 201   No results for input(s): CKTOTAL, CKMB, CKMBINDEX, TROPONINI in the last 168 hours. No results for input(s): LABPROT, INR in the last 72 hours. No results for input(s): COLORURINE, LABSPEC, Robinson, GLUCOSEU, HGBUR, BILIRUBINUR, KETONESUR, PROTEINUR, UROBILINOGEN, NITRITE, LEUKOCYTESUR in the last 72 hours.  Invalid input(s): APPERANCEUR     Component Value Date/Time   CHOL 119 08/08/2020 0625   CHOL 102 11/26/2013 0000   TRIG 105 08/08/2020 0625   TRIG 93 11/26/2013 0000   HDL 42 08/08/2020 0625   CHOLHDL 2.8 08/08/2020 0625   VLDL 21 08/08/2020 0625   LDLCALC 56 08/08/2020 0625   LDLCALC 58 11/26/2013 0000   Lab Results  Component Value Date   HGBA1C 7.3 (H)  12/17/2020      Component Value Date/Time   LABOPIA NONE DETECTED 08/05/2013 2207   COCAINSCRNUR NONE DETECTED 08/05/2013 2207   LABBENZ NONE DETECTED 08/05/2013 2207   AMPHETMU NONE DETECTED 08/05/2013 2207   THCU NONE DETECTED 08/05/2013 2207   LABBARB NONE DETECTED 08/05/2013 2207    No results for input(s): ETH in the last 168 hours.  I have personally reviewed the radiological images below and agree with the radiology interpretations.  DG Wrist 2 Views Right  Result Date: 12/22/2020 CLINICAL DATA:  Tenderness with movement. EXAM: RIGHT WRIST - 2 VIEW COMPARISON:  None. FINDINGS: Radiocarpal joint appears within normal limits. There are degenerative changes the articulation between the scaphoid and the multangular bones and at the first and second carpometacarpal articulations. No sign of fracture. Regional arterial calcification incidentally noted. IMPRESSION: Radial side degenerative changes as described above which could certainly be painful. Electronically Signed   By: Nelson Chimes M.D.   On: 12/22/2020 13:51   CT Head Wo Contrast  Result Date: 12/14/2020 CLINICAL DATA:  Mental status change, unknown cause EXAM: CT HEAD WITHOUT  CONTRAST TECHNIQUE: Contiguous axial images were obtained from the base of the skull through the vertex without intravenous contrast. 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 Old left parietal infarct. Findings are stable since prior study. Small right subdural hematoma again noted overlying the right frontoparietal lobe, 3 mm compared to 5 mm previously. There is atrophy and chronic small vessel disease changes. Ventriculomegaly likely rib related to ex vacuo dilatation. No acute infarct or acute hemorrhage. Vascular: No hyperdense vessel or unexpected calcification. Skull: Prior right temporoparietal craniotomy. No acute calvarial abnormality. Sinuses/Orbits: No acute findings Other: None IMPRESSION: Small right subdural hematoma, 3 mm compared with 5 mm previously. No new hemorrhage. Old right MCA and left parietal infarcts, stable. Ventriculomegaly related to ex vacuo dilatation. Atrophy, chronic microvascular disease. No acute intracranial abnormality. Electronically Signed   By: Rolm Baptise M.D.   On: 12/14/2020 13:50   MR BRAIN WO CONTRAST  Result Date: 12/18/2020 CLINICAL DATA:  Mental status change, unknown cause EXAM: MRI HEAD WITHOUT CONTRAST TECHNIQUE: Multiplanar, multiecho pulse sequences of the brain and surrounding structures were obtained without intravenous contrast. COMPARISON:  08/29/2020 FINDINGS: Motion artifact is present. Brain: Small focus of reduced diffusion is present within the left cerebellar vermis. Diffusion hyperintensity in the region of prior right MCA infarct is probably artifactual. There is a 1.2 cm subacute hematoma of the anterior right superior frontal gyrus (series 6, image 18). Chronic right MCA territory infarction with chronic blood products. Ex vacuo dilatation of the adjacent right lateral ventricle. Chronic infarct on the left near junction of parietal, temporal, and occipital lobes also with chronic blood products.  Additional confluent T2 hyperintensity in the supratentorial white matter is nonspecific but may reflect chronic microvascular ischemic changes. Disproportionate prominence of the lateral and third ventricles is unchanged. Thin extra-axial collection underlies craniotomy. Vascular: Major vessel flow voids at the skull base are preserved. Skull and upper cervical spine: Normal marrow signal is preserved. Prior right craniotomy. Sinuses/Orbits: Minor mucosal thickening. No acute orbital abnormality. Other: Sella is unremarkable.  Mastoid air cells are clear. IMPRESSION: Small acute infarct left cerebellar vermis. Small subacute hematoma anterior right frontal lobe (isodense on CT). Stable chronic findings of infarcts, volume loss, and chronic microvascular ischemic changes. Electronically Signed   By: Macy Mis M.D.   On: 12/18/2020 12:49   DG Chest [facility] 1 View  Result  Date: 12/14/2020 CLINICAL DATA:  Altered mental status EXAM: PORTABLE CHEST 1 VIEW COMPARISON:  Chest radiograph 08/07/2020 FINDINGS: Median sternotomy wires and mitral and aortic valve prostheses are stable. The cardiomediastinal silhouette is stable. There is calcified atherosclerotic plaque of the aortic arch. Lung volumes are low. There is no focal consolidation or pulmonary edema. There is no pleural effusion or pneumothorax. There is no acute osseous abnormality. IMPRESSION: Low lung volumes. Otherwise, no radiographic evidence of acute cardiopulmonary process. Electronically Signed   By: Valetta Mole M.D.   On: 12/14/2020 13:01   DG Abd Portable 1V  Result Date: 12/18/2020 CLINICAL DATA:  Feeding tube placement EXAM: PORTABLE ABDOMEN - 1 VIEW COMPARISON:  09/01/2020 FINDINGS: Feeding tube terminates at the distal stomach. Cardiomegaly with median sternotomy and left base airspace disease. Non-obstructive bowel gas pattern. No gross free intraperitoneal air. IMPRESSION: Feeding tube terminating at the distal stomach. Electronically  Signed   By: Abigail Miyamoto M.D.   On: 12/18/2020 16:44   EEG adult  Result Date: 12/17/2020 Lora Havens, MD     12/17/2020  1:00 PM Patient Name: ASENCION LOVEDAY MRN: [id number] Epilepsy Attending: Lora Havens Referring Physician/Provider: Dr Oren Binet Date: 12/17/2020 Duration: 24.04 mins Patient history: 80 year old male with recent subdural hematoma in May, seizures who presented with altered mental status.  EEG to evaluate for seizures. Level of alertness: Awake AEDs during EEG study: LEV Technical aspects: This EEG study was done with scalp electrodes positioned according to the 10-20 International system of electrode placement. Electrical activity was acquired at a sampling rate of 500Hz  and reviewed with a high frequency filter of 70Hz  and a low frequency filter of 1Hz . EEG data were recorded continuously and digitally stored. Description: The posterior dominant rhythm consists of 8-9 Hz activity of moderate voltage (25-35 uV) seen predominantly in posterior head regions, symmetric and reactive to eye opening and eye closing. EEG showed continuous low amplitude 2 to 3 Hz delta slowing in right frontotemporal region. Intermittent generalized 3 to 5 Hz theta-delta slowing was also noted. Hyperventilation and photic stimulation were not performed.   ABNORMALITY - Continuous slow, right frontotemporal region - Intermittent slow, generalized IMPRESSION: This study is suggestive of cortical dysfunction in right frontotemporal region likely secondary to underlying structural abnormality/stroke/subdural hemorrhage.  Additionally there is mild diffuse encephalopathy, nonspecific etiology.  No seizures or epileptiform discharges were seen throughout the recording.  If suspicion for ictal- interictal activity remains a concern, a prolonged study can be considered. Priyanka Barbra Sarks   Overnight EEG with video  Result Date: 12/18/2020 Lora Havens, MD     12/19/2020  7:36 AM Patient Name: ANEUDY CHAMPLAIN MRN: [id number] Epilepsy Attending: Lora Havens Referring Physician/Provider: Dr Oren Binet Duration: 12/17/2020 1638 to 12/18/2020 1002  Patient history: 80 year old male with recent subdural hematoma in May, seizures who presented with altered mental status.  EEG to evaluate for seizures.  Level of alertness: Awake, asleep  AEDs during EEG study: LEV  Technical aspects: This EEG study was done with scalp electrodes positioned according to the 10-20 International system of electrode placement. Electrical activity was acquired at a sampling rate of 500Hz  and reviewed with a high frequency filter of 70Hz  and a low frequency filter of 1Hz . EEG data were recorded continuously and digitally stored.  Description: The posterior dominant rhythm consists of 8-9 Hz activity of moderate voltage (25-35 uV) seen predominantly in posterior head regions, symmetric and reactive to eye opening and eye closing.  Sleep was characterized by vertex waves, sleep spindles (12 to 14 Hz), maximal frontocentral region.  EEG showed continuous low amplitude 2 to 3 Hz delta slowing in right frontotemporal region. Hyperventilation and photic stimulation were not performed.    ABNORMALITY - Continuous slow, right frontotemporal region  IMPRESSION: This study is suggestive of cortical dysfunction in right frontotemporal region likely secondary to underlying structural abnormality/stroke/subdural hemorrhage. No seizures or epileptiform discharges were seen throughout the recording. Lora Havens   ECHOCARDIOGRAM COMPLETE  Result Date: 12/15/2020    ECHOCARDIOGRAM REPORT   Patient Name:   Tayron GRAESYN SCHREIFELS Date of Exam: 12/15/2020 Medical Rec #:  [id number]     Height:       67.0 in Accession #:    [id number]    Weight:       166.2 lb Date of Birth:  [birth year]     BSA:          1.869 m Patient Age:    46 years      BP:           120/67 mmHg Patient Gender: M             HR:           98 bpm. Exam Location:  Inpatient Procedure:  2D Echo, Cardiac Doppler, Color Doppler and Intracardiac            Opacification Agent Indications:    Fever R50.9  History:        Patient has prior history of Echocardiogram examinations, most                 recent 08/07/2020. Stroke; Arrythmias:Atrial Fibrillation. Past                 history of rheumatic fever, acute rheumatic pericarditis.                 Thoracic aortic aneurysm.                 Aortic Valve: 27 mm Carpentier-Edwards prosthetic valve is                 present in the aortic position. Procedure Date: 10/18/2004.  Sonographer:    Darlina Sicilian RDCS Sonographer#2:  Darlina Sicilian RDCS Referring Phys: [id number] [place]  1. The mitral valve has been replaced with an unknown bioprosthesis. No evidence of mitral valve regurgitation. The mean mitral valve gradient is 10.0 mmHg with average heart rate of 101 bpm. PHT 70 ms. Suboptimal LVOT spectral Doppler- VTI deferred. Date of Procedure Date: 10/18/2004.  2. The aortic valve has been repaired/replaced. Aortic valve regurgitation is not visualized. 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 Procedure Date: 10/18/2004. Aortic valve mean gradient measures 16.0 mmHg.  Aortic valve acceleration time measures 74 msec.  3. Left ventricular ejection fraction, by estimation, is 65 to 70%. The left ventricle has normal function. The left ventricle has no regional wall motion abnormalities. There is mild concentric left ventricular hypertrophy. Left ventricular diastolic parameters are indeterminate.  4. Aortic dilatation noted. 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  5. Right ventricular systolic function is normal. The right ventricular size is normal. Comparison(s): A prior study was performed on 08/07/2020. Prior images reviewed side by side. Stable aortic root and ascending aorta size. Increased  mitral valve gradients, but at  higher hear rates from prior. FINDINGS  Left Ventricle: Left ventricular ejection fraction, by estimation, is 65 to 70%. The left ventricle has normal function. The left ventricle has no regional wall motion abnormalities. Definity contrast agent was given IV to delineate the left ventricular  endocardial borders. The left ventricular internal cavity size was normal in size. There is mild concentric left ventricular hypertrophy. Left ventricular diastolic parameters are indeterminate. Right Ventricle: The right ventricular size is normal. No increase in right ventricular wall thickness. Right ventricular systolic function is normal. Left Atrium: Left atrial size was normal in size. Right Atrium: Right atrial size was normal in size. Pericardium: There is no evidence of pericardial effusion. Mitral Valve: The mitral valve has been repaired/replaced. No evidence of mitral valve regurgitation. There is a 29 mm Carpentier-Edwards prosthetic present in the mitral position. MV peak gradient, 17.1 mmHg. The mean mitral valve gradient is 10.0 mmHg with average heart rate of 101 bpm. Tricuspid Valve: The tricuspid valve is grossly normal. Tricuspid valve regurgitation is mild . No evidence of tricuspid stenosis. Aortic Valve: The aortic valve has been repaired/replaced. Aortic valve regurgitation is not visualized. Aortic valve mean gradient measures 16.0 mmHg. Aortic valve peak gradient measures 24.4 mmHg. Aortic valve area, by VTI measures 1.56 cm. There is a  27 mm Carpentier-Edwards prosthetic valve present in the aortic position. Procedure Date: 10/18/2004. Pulmonic Valve: The pulmonic valve was normal in structure. Pulmonic valve regurgitation is mild. No evidence of pulmonic stenosis. Aorta: The ascending aorta was not well visualized and aortic dilatation noted. There is moderate dilatation of the aortic root, measuring 48 mm. There is moderate dilatation of the ascending aorta,  measuring 49 mm. IAS/Shunts: The atrial septum is grossly normal.  LEFT VENTRICLE PLAX 2D LVIDd:         4.80 cm LVIDs:         3.50 cm LV PW:         1.30 cm LV IVS:        1.30 cm LVOT diam:     3.25 cm LV SV:         62 LV SV Index:   33 LVOT Area:     8.30 cm  LV Volumes (MOD) LV vol d, MOD A2C: 81.2 ml LV vol d, MOD A4C: 94.4 ml LV vol s, MOD A2C: 18.0 ml LV vol s, MOD A4C: 30.1 ml LV SV MOD A2C:     63.2 ml LV SV MOD A4C:     94.4 ml LV SV MOD BP:      63.9 ml RIGHT VENTRICLE RV S prime:     10.40 cm/s LEFT ATRIUM             Index        RIGHT ATRIUM          Index LA diam:        4.50 cm 2.41 cm/m   RA Area:     8.74 cm LA Vol (A2C):   58.1 ml 31.08 ml/m  RA Volume:   12.60 ml 6.74 ml/m LA Vol (A4C):   56.9 ml 30.44 ml/m LA Biplane Vol: 58.9 ml 31.51 ml/m  AORTIC VALVE AV Area (Vmax):    5.29 cm AV Area (Vmean):   1.55 cm AV Area (VTI):     1.56 cm AV Vmax:           247.00 cm/s AV Vmean:          190.500 cm/s AV  VTI:            0.401 m AV Peak Grad:      24.4 mmHg AV Mean Grad:      16.0 mmHg LVOT Vmax:         157.40 cm/s LVOT Vmean:        35.700 cm/s LVOT VTI:          0.075 m LVOT/AV VTI ratio: 0.19  AORTA Ao Asc diam: 4.90 cm MITRAL VALVE                TRICUSPID VALVE MV Area (PHT): 3.16 cm     TR Peak grad:   26.4 mmHg MV Area VTI:   1.39 cm     TR Vmax:        257.00 cm/s MV Peak grad:  17.1 mmHg MV Mean grad:  10.0 mmHg    SHUNTS MV Vmax:       2.07 m/s     Systemic VTI:  0.08 m MV Vmean:      151.0 cm/s   Systemic Diam: 3.25 cm MV Decel Time: 240 msec MV E velocity: 195.00 cm/s Rudean Haskell MD Electronically signed by Rudean Haskell MD Signature Date/Time: 12/15/2020/12:49:20 PM    Final       PHYSICAL EXAM  Temp:  [98.1 F (36.7 C)-99.7 F (37.6 C)] 98.4 F (36.9 C) (10/21 1152) Pulse Rate:  [74-83] 74 (10/21 1152) Resp:  [12-20] 20 (10/21 1152) BP: (108-131)/(69-77) 116/69 (10/21 1152) SpO2:  [94 %-97 %] 96 % (10/21 1152) Weight:  [82.1 kg] 82.1 kg (10/21  0500)  General - Well nourished, well developed, in no apparent distress, sleepy.  Ophthalmologic - fundi not visualized due to noncooperation.  Cardiovascular - Regular rhythm and rate, not in afib.  Neuro - eyes open on voice, but no eye contact today.  Able to say "so far" when I asked "how are you doing", but not answer other questions, not follow commands.  Able to blinking spontaneous, however not blinking to visual threat bilaterally.  No tracking.  Not interactive.  Mild left facial droop.  Tongue protrusion not corporative.  Left upper and lower extremity hemiplegia except left lower extremity slight withdraw with pain stimulation.  Right upper extremity localizing to pain, purposeful barely against gravity.  Right lower extremity no spontaneous movement, however withdrawal to pain 2/5. Sensation, coordination and gait not tested.   ASSESSMENT/PLAN [name] [name] is a 80 y.o. 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 fib status post maze procedure and LAA clipping, stroke admitted for lethargy, weight loss, bacteremia and altered mental status. No tPA given due to outside window.    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 If PEG needed, may need heparin IV for transition Ongoing  aggressive stroke risk factor management Therapy recommendations: Pending Disposition: Pending  Bacteremia Endocarditis UTI TEE showed small mitral wall vegetation Blood culture positive for Enterococcus->repeat Cx 10/13 1/2 positive->repeat Cx 10/15 again 1/2 positive but likely contamination  Blood Cx 10/17 NGTD Urine culture positive for staph aureus Currently on ampicillin and Rocephin ID on board   PAF Status post maze and left AA clipping Was on Coumadin before Now on Eliquis and [facility] on board  History of stroke, SDH and seizure Stroke in 08/2013 07/27/2020 admitted for traumatic right large SDH status post bur hole evacuation.  Coumadin was on hold due to SDH 08/07/2020 admitted for altered mental status with left-sided weakness, CT showed right MCA large infarct.  CTA head and neck right ICA and right M2 occlusion status post IR with TICI2c revascularization.  EF 60 to 65%, LDL 56, A1c 6.2, patient discharged to CIR.  Started Eliquis on 08/25/2020 in CIR. 08/28/2020 had a seizure activity, MRI no significant change, put on Keppra. Current admission, EEG and long-term EEG no seizure.  Diabetes HgbA1c 7.3 goal < 7.0 Uncontrolled CBG monitoring SSI DM education and close PCP follow up  Hypotension On the low end BP goal < 160 Long term BP goal normotensive  Hyperlipidemia Home meds: Lipitor 80 LDL 56 in 08/2020, goal < 70 Hold off now due to elevated LFTs Consider to resume statin once LFTs normalize  Dysphagia On tube feeding through [place] Family requested PEG tube if needed Plan for caloric count and PEG tube next week if needed Consider heparin IV transition Eliquis for PEG tube placement if needed.  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 Please call with questions. Pt will follow up with stroke clinic Dr. [name] at [facility] on 02/03/21. Thanks for the consult.   Rosalin Hawking, MD PhD Stroke Neurology 12/25/2020 12:34 PM    To contact Stroke Continuity provider, please refer to [email or web address] After hours, contact General Neurology

## 2020-12-25 NOTE — Progress Notes (Signed)
PHARMACY CONSULT NOTE FOR:  OUTPATIENT  PARENTERAL ANTIBIOTIC THERAPY (OPAT)  Indication: Enterococcal endocarditis Regimen: Ampicillin 12 gm every 24 hours as a continuous infusion + ceftriaxone 2 gm IV q 12 hours  End date: 01/28/21  IV antibiotic discharge orders are pended. To discharging provider:  please sign these orders via discharge navigator,  Select New Orders & click on the button choice - Manage This Unsigned Work.     Thank you for allowing pharmacy to be a part of this patient's care.  Jimmy Footman, PharmD, BCPS, [place] [facility] Clinical Pharmacist Phone: [phone] 12/25/2020, 12:22 PM

## 2020-12-25 NOTE — Progress Notes (Signed)
INFECTIOUS DISEASE PROGRESS NOTE  ID: [name] [name] is a 80 y.o. male with  Active Problems:   Pressure injury of skin   Hypernatremia   Bacteremia   S/P MVR (mitral valve repair)   History of CVA in adulthood   Protein-calorie malnutrition, severe   Prosthetic valve endocarditis (HCC)  Subjective: No response  Abtx:  Anti-infectives (From admission, onward)    Start     Dose/Rate Route Frequency Ordered Stop   12/18/20 1500  cefTRIAXone (ROCEPHIN) 2 g in sodium chloride 0.9 % 100 mL IVPB        2 g 200 mL/hr over 30 Minutes Intravenous Every 12 hours 12/18/20 1344     12/15/20 1700  cefTRIAXone (ROCEPHIN) 1 g in sodium chloride 0.9 % 100 mL IVPB  Status:  Discontinued        1 g 200 mL/hr over 30 Minutes Intravenous Every 24 hours 12/14/20 1648 12/15/20 0624   12/15/20 0630  ampicillin (OMNIPEN) 2 g in sodium chloride 0.9 % 100 mL IVPB        2 g 300 mL/hr over 20 Minutes Intravenous Every 6 hours 12/15/20 0624     12/15/20 0630  gentamicin (GARAMYCIN) IVPB 80 mg        80 mg 100 mL/hr over 30 Minutes Intravenous  Once 12/15/20 0628 12/15/20 0732   12/14/20 1645  cefTRIAXone (ROCEPHIN) 1 g in sodium chloride 0.9 % 100 mL IVPB        1 g 200 mL/hr over 30 Minutes Intravenous  Once 12/14/20 1642 12/14/20 1830       Medications: Scheduled:  amantadine  200 mg Per Tube BID   diclofenac Sodium  4 g Topical QID   dofetilide  375 mcg Oral BID   feeding supplement (PROSource TF)  45 mL Per Tube Daily   finasteride  5 mg Oral Daily   free water  125 mL Per Tube Q8H   insulin aspart  0-9 Units Subcutaneous TID WC   lacosamide  50 mg Per Tube BID   magnesium gluconate  500 mg Per Tube Daily   mouth rinse  15 mL Mouth Rinse BID   multivitamin with minerals  1 tablet Per Tube Daily   sodium chloride flush  3 mL Intravenous Q12H   tamsulosin  0.4 mg Oral Daily    Objective: Vital signs in last 24 hours: Temp:  [98.1 F (36.7 C)-99.7 F (37.6 C)] 98.4 F (36.9 C)  (10/21 1152) Pulse Rate:  [74-83] 74 (10/21 1152) Resp:  [12-20] 20 (10/21 1152) BP: (108-131)/(69-77) 116/69 (10/21 1152) SpO2:  [94 %-97 %] 96 % (10/21 1152) Weight:  [82.1 kg] 82.1 kg (10/21 0500)   General appearance: no distress Resp: clear to auscultation bilaterally Chest wall: non-tender L upper chest Cardio: regular rate and rhythm GI: normal findings: bowel sounds normal and soft, non-tender  Lab Results Recent Labs    12/23/20 0234 12/24/20 0130 12/25/20 0058  WBC 9.4  --   --   HGB 8.4*  --   --   HCT 25.9*  --   --   NA 127* 136 135  K 3.9 4.0 4.1  CL 98 103 103  CO2 $Re'23 25 25  'tgJ$ BUN $R'15 18 21  'sn$ CREATININE 0.74 0.78 0.75   Liver Panel Recent Labs    12/24/20 0130 12/25/20 0058  PROT 5.8* 5.8*  ALBUMIN 1.5* 1.6*  AST 111* 95*  ALT 161* 151*  ALKPHOS 85 90  BILITOT 0.2*  0.5   Sedimentation Rate No results for input(s): ESRSEDRATE in the last 72 hours. C-Reactive Protein No results for input(s): CRP in the last 72 hours.  Microbiology: Recent Results (from the past 240 hour(s))  MRSA Next Gen by PCR, Nasal     Status: None   Collection Time: 12/16/20  4:34 AM   Specimen: Nasal Mucosa; Nasal Swab  Result Value Ref Range Status   MRSA by PCR Next Gen NOT DETECTED NOT DETECTED Final    Comment: (NOTE) The GeneXpert MRSA Assay (FDA approved for NASAL specimens only), is one component of a comprehensive MRSA colonization surveillance program. It is not intended to diagnose MRSA infection nor to guide or monitor treatment for MRSA infections. Test performance is not FDA approved in patients less than 94 years old. Performed at [facility], [place] [address] [place], [place] [address]   Culture, blood (routine x 2)     Status: Abnormal   Collection Time: 12/17/20  8:01 AM   Specimen: BLOOD RIGHT HAND  Result Value Ref Range Status   Specimen Description BLOOD RIGHT HAND  Final   Special Requests   Final    BOTTLES DRAWN AEROBIC AND ANAEROBIC  Blood Culture adequate volume   Culture  Setup Time   Final    GRAM POSITIVE COCCI IN CHAINS ANAEROBIC BOTTLE ONLY CRITICAL VALUE NOTED.  VALUE IS CONSISTENT WITH PREVIOUSLY REPORTED AND CALLED VALUE.    Culture (A)  Final    ENTEROCOCCUS FAECALIS SUSCEPTIBILITIES PERFORMED ON PREVIOUS CULTURE WITHIN THE LAST 5 DAYS. Performed at [facility], [place] [address], [place], [place] [address]    Report Status 12/20/2020 FINAL  Final  Culture, blood (routine x 2)     Status: Abnormal   Collection Time: 12/17/20  8:03 AM   Specimen: BLOOD RIGHT WRIST  Result Value Ref Range Status   Specimen Description BLOOD RIGHT WRIST  Final   Special Requests   Final    BOTTLES DRAWN AEROBIC AND ANAEROBIC Blood Culture adequate volume   Culture  Setup Time   Final    GRAM POSITIVE COCCI ANAEROBIC BOTTLE ONLY CRITICAL VALUE NOTED.  VALUE IS CONSISTENT WITH PREVIOUSLY REPORTED AND CALLED VALUE.    Culture (A)  Final    ENTEROCOCCUS FAECALIS SUSCEPTIBILITIES PERFORMED ON PREVIOUS CULTURE WITHIN THE LAST 5 DAYS. Performed at [facility], [place] [address], [place], [place] [address]    Report Status 12/23/2020 FINAL  Final  Culture, blood (routine x 2)     Status: None   Collection Time: 12/19/20  7:46 AM   Specimen: BLOOD  Result Value Ref Range Status   Specimen Description BLOOD RIGHT ANTECUBITAL  Final   Special Requests   Final    BOTTLES DRAWN AEROBIC AND ANAEROBIC Blood Culture adequate volume   Culture   Final    NO GROWTH 5 DAYS Performed at [facility], [place] [address] [place], [place] [address]    Report Status 12/24/2020 FINAL  Final  Culture, blood (routine x 2)     Status: Abnormal   Collection Time: 12/19/20  7:47 AM   Specimen: BLOOD  Result Value Ref Range Status   Specimen Description BLOOD LEFT ANTECUBITAL  Final   Special Requests   Final    BOTTLES DRAWN AEROBIC AND ANAEROBIC Blood Culture adequate volume   Culture  Setup Time   Final    GRAM POSITIVE  COCCI AEROBIC BOTTLE ONLY CRITICAL VALUE NOTED.  VALUE IS  CONSISTENT WITH PREVIOUSLY REPORTED AND CALLED VALUE.    Culture (A)  Final    STAPHYLOCOCCUS EPIDERMIDIS THE SIGNIFICANCE OF ISOLATING THIS ORGANISM FROM A SINGLE SET OF BLOOD CULTURES WHEN MULTIPLE SETS ARE DRAWN IS UNCERTAIN. PLEASE NOTIFY THE MICROBIOLOGY DEPARTMENT WITHIN ONE WEEK IF SPECIATION AND SENSITIVITIES ARE REQUIRED. Performed at [facility], [place] [address], [place], [place] [address]    Report Status 12/23/2020 FINAL  Final  Culture, blood (routine x 2)     Status: None (Preliminary result)   Collection Time: 12/21/20  6:32 PM   Specimen: BLOOD RIGHT WRIST  Result Value Ref Range Status   Specimen Description BLOOD RIGHT WRIST  Final   Special Requests   Final    BOTTLES DRAWN AEROBIC AND ANAEROBIC Blood Culture adequate volume   Culture   Final    NO GROWTH 3 DAYS Performed at [facility], [place] [address] [place], [place] [address]    Report Status PENDING  Incomplete  Culture, blood (routine x 2)     Status: None (Preliminary result)   Collection Time: 12/21/20  6:40 PM   Specimen: BLOOD  Result Value Ref Range Status   Specimen Description BLOOD LEFT ANTECUBITAL  Final   Special Requests   Final    BOTTLES DRAWN AEROBIC AND ANAEROBIC Blood Culture adequate volume   Culture   Final    NO GROWTH 3 DAYS Performed at [facility], [place] [address], [place], [place] [address]    Report Status PENDING  Incomplete    Studies/Results: No results found.   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  Will continue his IV anbx  from 10-13 last positive BCx, 6 weeks from that Then would transition to po suppressive anbx given his prosthetic valve.   Amoxil $Remov'500mg'PijYyc$  bid indefinitely, as tolerated.  Available as needed.       Allergies  Allergen Reactions   Naproxen Hives   No [facility] Food Allergies Other (See Comments)    Scallops - distress, nausea and vomitting    OPAT Orders Discharge antibiotics to be given via PICC line Discharge antibiotics: Ceftraixone 2g ivpb q12h Ampicillin 2g ivpb q6h  Duration: 42 days from last positive BCx End Date: 01-28-21    [facility] Care Per Protocol: please  Home health RN for IV administration and teaching; PICC line care and labs.    Labs weekly while on IV antibiotics: _x_ CBC with differential __ BMP __x CMP __ CRP __ ESR __ Vancomycin trough __ CK  _x_ Please pull PIC at completion of IV antibiotics __ Please leave PIC in place until doctor has seen patient or been notified  Fax weekly labs to [phone]  Clinic Follow Up Appt: PCP      Bobby Rumpf MD, FACP Infectious Diseases (pager) [phone] [email or web address] 12/25/2020, 12:17 PM  LOS: 10 days

## 2020-12-25 NOTE — Progress Notes (Addendum)
ANTICOAGULATION CONSULT NOTE - Initial Consult  Pharmacy Consult:  Heparin Indication: atrial fibrillation  Allergies  Allergen Reactions   Naproxen Hives   No [facility] Food Allergies Other (See Comments)    Scallops - distress, nausea and vomitting    Patient Measurements: Height: 5\' 7"  (170.2 cm) Weight: 82.1 kg (181 lb) IBW/kg (Calculated) : 66.1 Heparin Dosing Weight: 82 kg  Vital Signs: Temp: 98.5 F (36.9 C) (10/21 0752) Temp Source: Oral (10/21 0752) BP: 118/74 (10/21 0752) Pulse Rate: 78 (10/21 0752)  Labs: Recent Labs    12/23/20 0234 12/24/20 0130 12/25/20 0058  HGB 8.4*  --   --   HCT 25.9*  --   --   PLT 201  --   --   CREATININE 0.74 0.78 0.75    Estimated Creatinine Clearance: 78 mL/min (by C-G formula based on SCr of 0.75 mg/dL).   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  Patient may need a PEG tube, so Pharmacy consulted to transition patient to IV heparin to prepare for the procedure.   CBC stable; no bleeding reported.  Noted patient has a recent history of SDH requiring bur hole evacuation in May and a CVA in June.  Goal of Therapy:  Heparin level 0.3-0.5 units/ml aPTT 66-85 seconds Monitor platelets by anticoagulation protocol: Yes   Plan:  D/C Eliquis At 2030, start IV heparin at 1100 units/hr - no bolus  Daily heparin level, aPTT and CBC  Gonsalo Cuthbertson D. Mina Marble, PharmD, BCPS, [place] 12/25/2020, 11:16 AM

## 2020-12-25 NOTE — Progress Notes (Signed)
Speech Language Pathology Treatment: Dysphagia  Patient Details Name: [name] [name] MRN: [id number] DOB: Jan 07, 1941 Today's Date: 12/25/2020 Time: 4827-0786 SLP Time Calculation (min) (ACUTE ONLY): 31 min  Assessment / Plan / Recommendation Clinical Impression  Pt seen for ongoing dysphagia management.  Pt continues on puree diet.  Wife reports pt is eating around 1/3 of meals.  Today pt tolerated puree meal tral, nectar thick liquids, and mechanical soft solids with no clinical s/s of aspiration.  Discussed with wife and MD adjusting tube feeds to increase appetite.  RD will assist with optimizing tube feeds to maximize PO intake.  Calorie count tentatively planned for next week.  Wife agreeable to trial of advanced diet.  Will upgrade to mechanical soft for dinner (wife will have to be away briefly around lunch time with friend coming to stay with pt).  Over weekend if advanced diet texture poses difficulty, may return to puree texture.  SLP will follow up early next week.  Recommend mechanical soft solids with nectar thick liquids.    HPI HPI: 80 y.o. 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  TEE completed on 12/18/20. Pt has a hx of cognitive deficits and dysphagia, with most recent MBS on 09/02/20 and most recent diet recommendations for Dysphagia 1 solids and honey-thick liquids.  Per family, pt has since advanced to mechanical soft solid and NTL.  Cortrak placed 12/18/20.      SLP Plan  Continue with current plan of care      Recommendations for follow up therapy are one component of a multi-disciplinary discharge planning process, led by the attending physician.   Recommendations may be updated based on patient status, additional functional criteria and insurance authorization.    Recommendations  Diet recommendations: Dysphagia 3 (mechanical soft);Nectar-thick liquid Liquids provided via: Teaspoon;Cup Medication Administration: Crushed with puree Supervision: Trained caregiver to feed patient Compensations: Slow rate;Small sips/bites Postural Changes and/or Swallow Maneuvers: Seated upright 90 degrees                Oral Care Recommendations: Oral care BID;Staff/trained caregiver to provide oral care Follow up Recommendations: 24 hour supervision/assistance SLP Visit Diagnosis: Dysphagia, unspecified (R13.10) Plan: Continue with current plan of care       [place] , [place], [place] Office: [phone]   12/25/2020, 10:39 AM

## 2020-12-25 NOTE — Progress Notes (Signed)
Pt currently receiving bath, no signs or symptoms of pain or discomfort, wife at bedside, tube feeding infusing via cortrak, no concerns noted.

## 2020-12-25 NOTE — Progress Notes (Signed)
PROGRESS NOTE        PATIENT DETAILS Name: [name] NOTEBOOM Age: 80 y.o. Sex: male Date of Birth: 12/25/1940 Admit Date: 12/14/2020 Admitting Physician Georgette Shell, MD XKG:YJEHU, [name] Right, MD  Brief Narrative: Patient is a 80 y.o. 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  Hospital course complicated by prolonged encephalopathy-however starting on 10/19-his encephalopathy has started to clear.  See below for further details.    Subjective: Eating breakfast with speech therapy and spouse.  No major events overnight.  He is awake-interacts occasionally-but overall seems much better than the past few days.  Objective: Vitals: Blood pressure 118/74, pulse 78, temperature 98.5 F (36.9 C), temperature source Oral, resp. rate 20, height 5\' 7"  (1.702 m), weight 82.1 kg, SpO2 94 %.   Exam: Gen Exam:Alert awake-not in any distress HEENT:atraumatic, normocephalic Chest: B/L clear to auscultation anteriorly CVS:S1S2 regular Abdomen:soft non tender, non distended Extremities:no edema Neurology: Left-sided hemiplegia.  Some mild swelling in the left upper extremity. Skin: no rash   Pertinent Labs/Radiology: Na: 135 K: 4.1 creatinine: 0.78 AST/ALT: 95/151   uric acid: 2.0  10/10>>Blood culture: Enterococcus Enterococcus faecalis 10/10>> urine culture: Staff aureus 10/11>> blood culture: 1/2-Enterococcus faecalis 10/13>> blood culture: 1/2-Enterococcus faecalis 10/15>> blood culture:  Staff epidermidis. 10/17>> blood culture: No growth  10/10>> CT head: Small [name] subdural hematoma 10/11>> TTE: EF-65-70%, no obvious vegetation. 10/14>> TEE: Prosthetic mitral valve vegetation 10/14>> MRI brain: Acute infarct left cerebellar vermis, small subacute hematoma in the anterior [name] frontal lobe.  10/13-2010/14>> LTM EEG: No seizures.  Assessment/Plan: Acute metabolic encephalopathy: Due to hypernatremia/enterococcal bacteremia-mental status slowly improving-he was much more awake and alert compared to the past few days-and interacting some with family.    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  Repeat blood culture on 10/17 negative so far.  Remains on Rocephin/ampicillin-ID following will defer further to infectious disease.      [name] wrist tenderness: Unclear etiology-no evidence of erythema swelling on exam.  Uric acid levels unremarkable.  X-ray suggestive of arthritis-better with supportive care and application of Voltaren gel.  Watch and follow.   Hypernatremia: Resolved with IVF-currently with NG tube feedings  Hyponatremia: Probably due to free water/NG tube feeds-normalized after adjusting free water flushes and 1 dose of IV Lasix.    Transaminitis: Suspect this is multifactorial from statin/Rocephin/underlying bacteremia.  Abdomen is soft.  Statin discontinued on 10/19-LFTs have started to downtrend.  Continue to hold statin-and follow trend.    PAF: Continue Tikosyn (pharmacy dosing)-remains on Eliquis-with plans to switch to IV heparin in case patient requires PEG tube placement next week.  Acute left cerebellar vermis CVA: Felt to be a septic emboli in the setting of mitral valve endocarditis.    History of seizure disorder: Initially  on Keppra-given ongoing lethargy/encephalopathy-switched to Vimpat on 10/6.  LTM EEG negative for seizures.    Recent SDH May 2022: From signout  from prior MD-Dr. 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  Continues to have significant left-sided hemiparesis.  On anticoagulation with Eliquis with plans to switch to IV heparin in case patient requires PEG tube next week.  History of rheumatic heart disease-s/p remote AVR/MVR (bioprosthetic valve)  Severe failure to thrive syndrome/frailty/poor oral intake/goals of care: Continue NG tube feedings for now-however patient now starting to become more awake and alert and per family-he is eating approximately one third of his meals.  Will get in touch with dietitian to see if we can reduce his tube regimen today-and if he continues to improve-we will plan a calorie count over the weekend.  If by early next week it is felt that his calorie intake is inadequate-family okay with proceeding with PEG tube placement.  See goals of care discussion below.  Goals of care: Long discussion with spouse/2 sons on 10/20 by this MD, goals of care as follows. 1.  DNR  2.  Calorie count over the weekend-if mental/clinical status continues to improve-okay to proceed with PEG tube placement 3.  Apart from PEG tube placement-no further aggressive care.  Family aware that patient is not a candidate for redo valve replacement surgery etc.  Family wants to give it several weeks-to allow for further mental status/functional improvement.Family okay with being discharged to SNF with peg tube (if needed)/PICC line-and palliative care follow-up.  If while at SNF-if felt to have no significant improvement in quality of life or if he suffers a significant  decompensation-family willing to transition to comfort measures then. 4.  Family aware that if in the interim (while hospitalized)-patient has significant decline/acute decompensation-organ damage/ICU level of care-family would be okay to transitioning to comfort measures in that event.  Nutrition Status: Nutrition Problem: Severe Malnutrition Etiology: chronic illness (CHF, Stroke) Signs/Symptoms: percent weight loss, moderate fat depletion, severe muscle depletion Percent weight loss: 12 % Interventions: Tube feeding, Prostat, MVI    Procedures: None Consults: None DVT Prophylaxis: Eliquis Code Status:DNR Family Communication: [name] at bedside  Time spent: 35 minutes-Greater than 50% of this time was spent in counseling, explanation of diagnosis, planning of further management, and coordination of care.  Diet: Diet Order             DIET DYS 3 Room service appropriate? Yes; Fluid consistency: Nectar Thick  Diet effective now           DIET - DYS 1 Room service appropriate? Yes; Fluid consistency: Nectar Thick  Diet effective now                      Disposition Plan: Status is: Inpatient  Remains inpatient appropriate because:Inpatient level of care appropriate due to severity of illness  Dispo: The patient is from: SNF              Anticipated d/c is to: SNF              Patient currently is not medically stable to d/c.   Difficult to place patient No    Barriers to Discharge: Persistent encephalopathy-persistent bacteremia with Enterococcus-on IV antibiotics-NG tube feedings ongoing due to poor oral intake.  Antimicrobial agents: Anti-infectives (From admission, onward)    Start     Dose/Rate Route Frequency Ordered Stop   12/18/20 1500  cefTRIAXone (ROCEPHIN) 2 g in sodium chloride 0.9 % 100 mL IVPB        2 g 200 mL/hr over 30 Minutes Intravenous Every 12 hours 12/18/20 1344     12/15/20 1700  cefTRIAXone (ROCEPHIN) 1 g in sodium chloride 0.9 %  100 mL IVPB  Status:  Discontinued        1 g 200 mL/hr over 30 Minutes Intravenous Every 24 hours 12/14/20 1648 12/15/20 0624   12/15/20 0630  ampicillin (OMNIPEN) 2 g in sodium chloride 0.9 % 100 mL IVPB        2 g 300 mL/hr over 20 Minutes Intravenous Every 6 hours 12/15/20 0624     12/15/20 0630  gentamicin (GARAMYCIN) IVPB 80 mg        80 mg 100 mL/hr over 30 Minutes Intravenous  Once 12/15/20 0628 12/15/20 0732   12/14/20 1645  cefTRIAXone (ROCEPHIN) 1 g in sodium chloride 0.9 % 100 mL IVPB        1 g 200 mL/hr over 30 Minutes Intravenous  Once 12/14/20 1642 12/14/20 1830        MEDICATIONS: Scheduled Meds:  amantadine  200 mg Per Tube BID   apixaban  5 mg Per Tube BID   diclofenac Sodium  4 g Topical QID   dofetilide  375 mcg Oral BID   feeding supplement (PROSource TF)  45 mL Per Tube Daily   finasteride  5 mg Oral Daily   free water  125 mL Per Tube Q8H   insulin aspart  0-9 Units Subcutaneous TID WC   lacosamide  50 mg Per Tube BID   magnesium gluconate  500 mg Per Tube Daily   mouth rinse  15 mL Mouth Rinse BID   multivitamin with minerals  1 tablet Per Tube Daily   sodium chloride flush  3 mL Intravenous Q12H   tamsulosin  0.4 mg Oral Daily   Continuous Infusions:  sodium chloride 10 mL/hr at 12/21/20 1204   sodium chloride     ampicillin (OMNIPEN) IV 2 g (12/25/20 0556)   cefTRIAXone (ROCEPHIN)  IV 2 g (12/25/20 0831)   feeding supplement (OSMOLITE 1.2 CAL) 1,000 mL (12/24/20 2145)   PRN Meds:.sodium chloride, sodium chloride flush   I have personally reviewed following labs and imaging studies  LABORATORY DATA: CBC: Recent Labs  Lab 12/19/20 0610 12/20/20 0153 12/21/20 0151 12/22/20 0224 12/23/20 0234  WBC 10.1 9.1 9.9 10.0 9.4  HGB 8.8* 8.7* 8.8* 8.8* 8.4*  HCT 27.6* 26.8* 26.6* 26.4* 25.9*  MCV 87.9 87.9 86.9 86.3 87.2  PLT 176 191 192 193 201     Basic Metabolic Panel: Recent Labs  Lab 12/18/20 1210 12/18/20 1630 12/18/20 1630  12/19/20 0610 12/19/20 1632 12/20/20 0153 12/21/20 0151 12/22/20 0224 12/23/20 0234 12/24/20 0130 12/25/20 0058  NA  --   --    < > 139  --  137 135 132* 127* 136 135  K  --   --    < > 3.5  --  4.2 4.0 4.1 3.9 4.0 4.1  CL  --   --    < > 108  --  109 104 102 98 103 103  CO2  --   --    < > 23  --  23 24 24 23 25 25   GLUCOSE  --   --    < > 186*  --  190* 171* 209* 195* 187* 161*  BUN  --   --    < >  16  --  15 15 15 15 18 21   CREATININE  --   --    < > 0.76  --  0.69 0.65 0.70 0.74 0.78 0.75  CALCIUM  --   --    < > 7.7*  --  7.5* 7.5* 7.3* 7.1* 7.6* 7.7*  MG 1.9 1.9  --  2.0 1.9 1.9  --  1.9 2.3 2.0 2.0  PHOS 2.8 3.1  --  2.7 2.4*  --   --   --   --   --   --    < > = values in this interval not displayed.     GFR: Estimated Creatinine Clearance: 78 mL/min (by C-G formula based on SCr of 0.75 mg/dL).  Liver Function Tests: Recent Labs  Lab 12/21/20 0151 12/22/20 0224 12/23/20 0234 12/24/20 0130 12/25/20 0058  AST 72* 100* 111* 111* 95*  ALT 86* 120* 139* 161* 151*  ALKPHOS 69 74 79 85 90  BILITOT 0.3 0.4 0.4 0.2* 0.5  PROT 5.5* 5.7* 5.6* 5.8* 5.8*  ALBUMIN 1.5* 1.5* <1.5* 1.5* 1.6*    No results for input(s): LIPASE, AMYLASE in the last 168 hours. No results for input(s): AMMONIA in the last 168 hours.   Coagulation Profile: No results for input(s): INR, PROTIME in the last 168 hours.   Cardiac Enzymes: No results for input(s): CKTOTAL, CKMB, CKMBINDEX, TROPONINI in the last 168 hours.  BNP (last 3 results) No results for input(s): PROBNP in the last 8760 hours.  Lipid Profile: No results for input(s): CHOL, HDL, LDLCALC, TRIG, CHOLHDL, LDLDIRECT in the last 72 hours.  Thyroid Function Tests: No results for input(s): TSH, T4TOTAL, FREET4, T3FREE, THYROIDAB in the last 72 hours.   Anemia Panel: No results for input(s): VITAMINB12, FOLATE, FERRITIN, TIBC, IRON, RETICCTPCT in the last 72 hours.  Urine analysis:    Component Value Date/Time    COLORURINE YELLOW 12/14/2020 1600   APPEARANCEUR CLOUDY (A) 12/14/2020 1600   LABSPEC 1.020 12/14/2020 1600   PHURINE 6.0 12/14/2020 1600   GLUCOSEU NEGATIVE 12/14/2020 1600   HGBUR LARGE (A) 12/14/2020 1600   BILIRUBINUR NEGATIVE 12/14/2020 1600   BILIRUBINUR negative 11/22/2013 0848   KETONESUR NEGATIVE 12/14/2020 1600   PROTEINUR NEGATIVE 12/14/2020 1600   UROBILINOGEN 0.2 11/22/2013 0848   UROBILINOGEN 1.0 08/05/2013 2207   NITRITE POSITIVE (A) 12/14/2020 1600   LEUKOCYTESUR LARGE (A) 12/14/2020 1600    Sepsis Labs: Lactic Acid, Venous    Component Value Date/Time   LATICACIDVEN 1.8 12/14/2020 2056    MICROBIOLOGY: Recent Results (from the past 240 hour(s))  Culture, blood (routine x 2)     Status: Abnormal   Collection Time: 12/15/20 11:20 AM   Specimen: BLOOD [name] ARM  Result Value Ref Range Status   Specimen Description BLOOD [name] ARM  Final   Special Requests   Final    BOTTLES DRAWN AEROBIC AND ANAEROBIC Blood Culture adequate volume   Culture  Setup Time   Final    GRAM POSITIVE COCCI ANAEROBIC BOTTLE ONLY CRITICAL VALUE NOTED.  VALUE IS CONSISTENT WITH PREVIOUSLY REPORTED AND CALLED VALUE.    Culture (A)  Final    ENTEROCOCCUS FAECALIS SUSCEPTIBILITIES PERFORMED ON PREVIOUS CULTURE WITHIN THE LAST 5 DAYS. Performed at [facility], [place] [address], [place], [place] [address]    Report Status 12/19/2020 FINAL  Final  Culture, blood (routine x 2)     Status: None   Collection Time: 12/15/20 11:25 AM   Specimen: BLOOD  LEFT ARM  Result Value Ref Range Status   Specimen Description BLOOD LEFT ARM  Final   Special Requests   Final    BOTTLES DRAWN AEROBIC ONLY Blood Culture results may not be optimal due to an inadequate volume of blood received in culture bottles   Culture   Final    NO GROWTH 5 DAYS Performed at [facility], [place] [address] [place], [place] [address]    Report Status 12/20/2020 FINAL  Final  MRSA Next Gen by PCR, Nasal      Status: None   Collection Time: 12/16/20  4:34 AM   Specimen: Nasal Mucosa; Nasal Swab  Result Value Ref Range Status   MRSA by PCR Next Gen NOT DETECTED NOT DETECTED Final    Comment: (NOTE) The GeneXpert MRSA Assay (FDA approved for NASAL specimens only), is one component of a comprehensive MRSA colonization surveillance program. It is not intended to diagnose MRSA infection nor to guide or monitor treatment for MRSA infections. Test performance is not FDA approved in patients less than 66 years old. Performed at [facility], [place] [address], [place], [place] [address]   Culture, blood (routine x 2)     Status: Abnormal   Collection Time: 12/17/20  8:01 AM   Specimen: BLOOD [name] HAND  Result Value Ref Range Status   Specimen Description BLOOD [name] HAND  Final   Special Requests   Final    BOTTLES DRAWN AEROBIC AND ANAEROBIC Blood Culture adequate volume   Culture  Setup Time   Final    GRAM POSITIVE COCCI IN CHAINS ANAEROBIC BOTTLE ONLY CRITICAL VALUE NOTED.  VALUE IS CONSISTENT WITH PREVIOUSLY REPORTED AND CALLED VALUE.    Culture (A)  Final    ENTEROCOCCUS FAECALIS SUSCEPTIBILITIES PERFORMED ON PREVIOUS CULTURE WITHIN THE LAST 5 DAYS. Performed at [facility], [place] [address] [place], [place] [address]    Report Status 12/20/2020 FINAL  Final  Culture, blood (routine x 2)     Status: Abnormal   Collection Time: 12/17/20  8:03 AM   Specimen: BLOOD [name] WRIST  Result Value Ref Range Status   Specimen Description BLOOD [name] WRIST  Final   Special Requests   Final    BOTTLES DRAWN AEROBIC AND ANAEROBIC Blood Culture adequate volume   Culture  Setup Time   Final    GRAM POSITIVE COCCI ANAEROBIC BOTTLE ONLY CRITICAL VALUE NOTED.  VALUE IS CONSISTENT WITH PREVIOUSLY REPORTED AND CALLED VALUE.    Culture (A)  Final    ENTEROCOCCUS FAECALIS SUSCEPTIBILITIES PERFORMED ON PREVIOUS CULTURE WITHIN THE LAST 5 DAYS. Performed at [facility], [place]  [address], [place], [place] [address]    Report Status 12/23/2020 FINAL  Final  Culture, blood (routine x 2)     Status: None   Collection Time: 12/19/20  7:46 AM   Specimen: BLOOD  Result Value Ref Range Status   Specimen Description BLOOD [name] ANTECUBITAL  Final   Special Requests   Final    BOTTLES DRAWN AEROBIC AND ANAEROBIC Blood Culture adequate volume   Culture   Final    NO GROWTH 5 DAYS Performed at [facility], [place] [address] [place], [place] [address]    Report Status 12/24/2020 FINAL  Final  Culture, blood (routine x 2)     Status: Abnormal   Collection Time: 12/19/20  7:47 AM   Specimen: BLOOD  Result Value Ref Range Status   Specimen Description  BLOOD LEFT ANTECUBITAL  Final   Special Requests   Final    BOTTLES DRAWN AEROBIC AND ANAEROBIC Blood Culture adequate volume   Culture  Setup Time   Final    GRAM POSITIVE COCCI AEROBIC BOTTLE ONLY CRITICAL VALUE NOTED.  VALUE IS CONSISTENT WITH PREVIOUSLY REPORTED AND CALLED VALUE.    Culture (A)  Final    STAPHYLOCOCCUS EPIDERMIDIS THE SIGNIFICANCE OF ISOLATING THIS ORGANISM FROM A SINGLE SET OF BLOOD CULTURES WHEN MULTIPLE SETS ARE DRAWN IS UNCERTAIN. PLEASE NOTIFY THE MICROBIOLOGY DEPARTMENT WITHIN ONE WEEK IF SPECIATION AND SENSITIVITIES ARE REQUIRED. Performed at [facility], [place] [address], [place], [place] [address]    Report Status 12/23/2020 FINAL  Final  Culture, blood (routine x 2)     Status: None (Preliminary result)   Collection Time: 12/21/20  6:32 PM   Specimen: BLOOD [name] WRIST  Result Value Ref Range Status   Specimen Description BLOOD [name] WRIST  Final   Special Requests   Final    BOTTLES DRAWN AEROBIC AND ANAEROBIC Blood Culture adequate volume   Culture   Final    NO GROWTH 3 DAYS Performed at [facility], [place] [address], [place], [place] [address]    Report Status PENDING  Incomplete  Culture, blood (routine x 2)     Status: None (Preliminary result)   Collection Time:  12/21/20  6:40 PM   Specimen: BLOOD  Result Value Ref Range Status   Specimen Description BLOOD LEFT ANTECUBITAL  Final   Special Requests   Final    BOTTLES DRAWN AEROBIC AND ANAEROBIC Blood Culture adequate volume   Culture   Final    NO GROWTH 3 DAYS Performed at [facility], [place] [address] [place], [place] [address]    Report Status PENDING  Incomplete    RADIOLOGY STUDIES/RESULTS: No results found.   LOS: 10 days   Oren Binet, MD  [facility]    To contact the attending provider between 7A-7P or the covering provider during after hours 7P-7A, please log into the web site [email or web address] and access using universal [place] password for that web site. If you do not have the password, please call the hospital operator.  12/25/2020, 11:16 AM

## 2020-12-25 NOTE — Progress Notes (Signed)
Nutrition Follow-up  DOCUMENTATION CODES:   Severe malnutrition in context of chronic illness  INTERVENTION:   -Initiate calorie count on Saturday, 12/26/20; RD will follow-up on Monday, 12/28/20 with results -Ensure Enlive po BID, each supplement provides 350 kcal and 20 grams of protein   -Transition to nocturnal feedings:   Osmolite 1.2 @ 75 ml/hr over 12 hour period  45 ml Prosource TF daily  45 ml Prosource TF daily   125 ml free water flush every 4 hours   TF regimen provides 1130 kcals, 61 grams protein, and 1488 ml free water daily  NUTRITION DIAGNOSIS:   Severe Malnutrition related to chronic illness (CHF, Stroke) as evidenced by percent weight loss, moderate fat depletion, severe muscle depletion.  Ongoing  GOAL:   Patient will meet greater than or equal to 90% of their needs  Progressing   MONITOR:   TF tolerance, Labs, Weight trends, Skin, I & O's  REASON FOR ASSESSMENT:   Malnutrition Screening Tool    ASSESSMENT:   80 y.o. male presented to the ED with lethargy and change in mental status, pt coming from SNF. PMH includes stroke in May and June of 2022, CHF, diverticulosis, HTN, and T2DM. PT admitted with UTI, dehydration, and Stage II pressure injury.  10/14- cortrak tube placed, TF initiated; s/p TEE- revealed bioprosthetic valve endocarditis- not a candidate for re-do surgery 10/18- s/p BSE- advanced to dysphagia 1 diet with nectar thick liquids 10/21- s/p BSE- advanced to dysphagia 3 diet with nectar thick liquids  Reviewed I/O's:+380 ml x 24 hours and +10.5 L since admission  Case discussed with MD. Pt is more alert today and pt wife reports improved oral intake. Pt has been assisting pt with meals. Documented meal completion 15-25%.   Per MD, pt complains of feeling full from TF. Plan for calorie count and transition to nocturnal feedings. Family is amenable to PEG if pt unable to meet nutritional needs orally.   Medications reviewed.    Labs reviewed: CBGS: 183-185 (inpatient orders for glycemic control are 0-9 units insulin aspart TID with meals).    Diet Order:   Diet Order             DIET DYS 3 Room service appropriate? Yes; Fluid consistency: Nectar Thick  Diet effective now                   EDUCATION NEEDS:   No education needs have been identified at this time  Skin:  Skin Assessment: Skin Integrity Issues: Skin Integrity Issues:: Stage I, Stage II Stage I: Buttocks Stage II: L Elbow  Last BM:  12/23/20  Height:   Ht Readings from Last 1 Encounters:  12/14/20 5\' 7"  (1.702 m)    Weight:   Wt Readings from Last 1 Encounters:  12/25/20 82.1 kg    Ideal Body Weight:  67.3 kg  BMI:  Body mass index is 28.35 kg/m.  Estimated Nutritional Needs:   Kcal:  2100-2300  Protein:  105-120 grams  Fluid:  >/= 2.1 L    Loistine Chance, RD, LDN, [place] Registered Dietitian II Certified Diabetes Care and Education Specialist Please refer to [facility] for RD and/or RD on-call/weekend/after hours pager

## 2020-12-26 DIAGNOSIS — I330 Acute and subacute infective endocarditis: Secondary | ICD-10-CM | POA: Diagnosis not present

## 2020-12-26 DIAGNOSIS — Z9889 Other specified postprocedural states: Secondary | ICD-10-CM | POA: Diagnosis not present

## 2020-12-26 DIAGNOSIS — E43 Unspecified severe protein-calorie malnutrition: Secondary | ICD-10-CM | POA: Diagnosis not present

## 2020-12-26 DIAGNOSIS — R7881 Bacteremia: Secondary | ICD-10-CM | POA: Diagnosis not present

## 2020-12-26 LAB — CBC
HCT: 24.7 % — ABNORMAL LOW (ref 39.0–52.0)
Hemoglobin: 7.9 g/dL — ABNORMAL LOW (ref 13.0–17.0)
MCH: 28.4 pg (ref 26.0–34.0)
MCHC: 32 g/dL (ref 30.0–36.0)
MCV: 88.8 fL (ref 80.0–100.0)
Platelets: 235 10*3/uL (ref 150–400)
RBC: 2.78 MIL/uL — ABNORMAL LOW (ref 4.22–5.81)
RDW: 16.1 % — ABNORMAL HIGH (ref 11.5–15.5)
WBC: 10.8 10*3/uL — ABNORMAL HIGH (ref 4.0–10.5)
nRBC: 0.3 % — ABNORMAL HIGH (ref 0.0–0.2)

## 2020-12-26 LAB — COMPREHENSIVE METABOLIC PANEL
ALT: 127 U/L — ABNORMAL HIGH (ref 0–44)
AST: 69 U/L — ABNORMAL HIGH (ref 15–41)
Albumin: 1.6 g/dL — ABNORMAL LOW (ref 3.5–5.0)
Alkaline Phosphatase: 85 U/L (ref 38–126)
Anion gap: 8 (ref 5–15)
BUN: 17 mg/dL (ref 8–23)
CO2: 25 mmol/L (ref 22–32)
Calcium: 7.9 mg/dL — ABNORMAL LOW (ref 8.9–10.3)
Chloride: 103 mmol/L (ref 98–111)
Creatinine, Ser: 0.74 mg/dL (ref 0.61–1.24)
GFR, Estimated: >60 mL/min (ref >60)
Glucose, Bld: 147 mg/dL — ABNORMAL HIGH (ref 70–99)
Potassium: 3.9 mmol/L (ref 3.5–5.1)
Sodium: 136 mmol/L (ref 135–145)
Total Bilirubin: 0.3 mg/dL (ref 0.3–1.2)
Total Protein: 5.9 g/dL — ABNORMAL LOW (ref 6.5–8.1)

## 2020-12-26 LAB — GLUCOSE, CAPILLARY
Glucose-Capillary: 112 mg/dL — ABNORMAL HIGH (ref 70–99)
Glucose-Capillary: 147 mg/dL — ABNORMAL HIGH (ref 70–99)
Glucose-Capillary: 150 mg/dL — ABNORMAL HIGH (ref 70–99)
Glucose-Capillary: 160 mg/dL — ABNORMAL HIGH (ref 70–99)
Glucose-Capillary: 161 mg/dL — ABNORMAL HIGH (ref 70–99)
Glucose-Capillary: 180 mg/dL — ABNORMAL HIGH (ref 70–99)

## 2020-12-26 LAB — APTT
aPTT: 46 seconds — ABNORMAL HIGH (ref 24–36)
aPTT: 67 seconds — ABNORMAL HIGH (ref 24–36)
aPTT: 73 seconds — ABNORMAL HIGH (ref 24–36)

## 2020-12-26 LAB — HEPARIN LEVEL (UNFRACTIONATED)
Heparin Unfractionated: 1 IU/mL — ABNORMAL HIGH (ref 0.30–0.70)
Heparin Unfractionated: 0.97 IU/mL — ABNORMAL HIGH (ref 0.30–0.70)
Heparin Unfractionated: 0.61 IU/mL (ref 0.30–0.70)

## 2020-12-26 LAB — CULTURE, BLOOD (ROUTINE X 2)
Culture: NO GROWTH
Culture: NO GROWTH
Special Requests: ADEQUATE
Special Requests: ADEQUATE

## 2020-12-26 LAB — MAGNESIUM: Magnesium: 1.9 mg/dL (ref 1.7–2.4)

## 2020-12-26 MED ORDER — MAGNESIUM SULFATE 2 GM/50ML IV SOLN
2.0000 g | Freq: Once | INTRAVENOUS | Status: AC
  Administered 2020-12-26: 2 g via INTRAVENOUS
  Filled 2020-12-26: qty 50

## 2020-12-26 MED ORDER — POTASSIUM CHLORIDE 20 MEQ PO PACK
40.0000 meq | PACK | Freq: Once | ORAL | Status: AC
  Administered 2020-12-26: 40 meq via ORAL
  Filled 2020-12-26: qty 2

## 2020-12-26 NOTE — Progress Notes (Addendum)
ANTICOAGULATION CONSULT NOTE  Pharmacy Consult:  Heparin (Apixaban on hold) Indication: atrial fibrillation  Allergies  Allergen Reactions   Naproxen Hives   No [facility] Food Allergies Other (See Comments)    Scallops - distress, nausea and vomitting    Patient Measurements: Height: 5\' 7"  (170.2 cm) Weight: 82.1 kg (181 lb) IBW/kg (Calculated) : 66.1 Heparin Dosing Weight: 82 kg  Vital Signs: Temp: 98.4 F (36.9 C) (10/22 0200) Temp Source: Axillary (10/22 0200) BP: 124/76 (10/22 0200) Pulse Rate: 72 (10/22 0200)  Labs: Recent Labs    12/24/20 0130 12/25/20 0058 12/26/20 0300  HGB  --   --  7.9*  HCT  --   --  24.7*  PLT  --   --  235  APTT  --   --  46*  HEPARINUNFRC  --   --  1.00*  CREATININE 0.78 0.75  --      Estimated Creatinine Clearance: 78 mL/min (by C-G formula based on SCr of 0.75 mg/dL).   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  Patient may need a PEG tube, so Pharmacy consulted to  transition patient to IV heparin to prepare for the procedure.  CBC stable; no bleeding reported.  Noted patient has a recent history of SDH requiring bur hole evacuation in May and a CVA in June.  10/22 AM update:  aPTT below goal Hgb 8.4>7.9   Goal of Therapy:  Heparin level 0.3-0.5 units/ml aPTT 66-85 seconds Monitor platelets by anticoagulation protocol: Yes   Plan:  Inc heparin to 1200 units/hr 1230 heparin level and aPTT Watch Hgb  Narda Bonds, PharmD, BCPS Clinical Pharmacist Phone: [phone]

## 2020-12-26 NOTE — Progress Notes (Addendum)
PROGRESS NOTE        PATIENT DETAILS Name: [name] [name] Age: 80 y.o. Sex: male Date of Birth: 06/06/1940 Admit Date: 12/14/2020 Admitting Physician Georgette Shell, MD [id number], Arvid Right, MD  Brief Narrative: Patient is a 80 y.o. 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  Hospital course complicated by prolonged encephalopathy. Thankully starting on 10/19-encephalopathy has started to improve.  See below for further details.  Subjective: Much more awake and alert-he was able to interact and talk to me today (he has not been this awake/alert/interactive for the past 10 days)  Objective: Vitals: Blood pressure 127/82, pulse 76, temperature 97.7 F (36.5 C), temperature source Axillary, resp. rate 20, height 5\' 7"  (1.702 m), weight 82.7 kg, SpO2 96 %.   Exam: Gen Exam: Awake-talk to me in a few words.  Follows commands.  Tracks my movement. HEENT:atraumatic, normocephalic Chest: B/L clear to auscultation anteriorly CVS:S1S2 regular Abdomen:soft non tender, non distended Extremities: Some mild swelling in the left upper extremity. Neurology: Left-sided hemiplegia. Skin: no rash   Pertinent Labs/Radiology: Hb: 7.9 Na: 136 K: 3.9  creatinine: 0.74  AST/ALT: 69/127  Microbiology: 10/10>>Blood culture: Enterococcus Enterococcus faecalis 10/10>> urine culture: Staff aureus 10/11>> blood culture: 1/2-Enterococcus faecalis 10/13>> blood culture: 1/2-Enterococcus  faecalis 10/15>> blood culture: Staff epidermidis. 10/17>> blood culture: No growth  Radiology/Echo/EEG: 10/10>> CT head: Small right subdural hematoma 10/11>> TTE: EF-65-70%, no obvious vegetation. 10/13-10/14>> LTM EEG: No seizures. 10/14>> TEE: Prosthetic mitral valve vegetation 10/14>> MRI brain: Acute infarct left cerebellar vermis, small subacute hematoma in the right frontal lobe.   Assessment/Plan: Acute metabolic encephalopathy: Due to hypernatremia/enterococcal bacteremia-had prolonged encephalopathy-but since 10/19-gradually improving.  This morning he was much more awake/alert and actively interacting with me-this is significantly better than what he was for the past 10 days.  Remains on amantadine 200 mg twice daily for arousal.  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  Repeat blood culture on 10/17 negative so far.  Remains on Rocephin/ampicillin-with stop date of 01/28/2021.  He will then need to transition to amoxicillin 500 mg twice daily indefinitely.  Plan to place PICC line sometime next week prior to discharge.  Right wrist tenderness: Unclear etiology-no evidence of erythema swelling on exam.  Uric acid levels unremarkable.  X-ray suggestive of arthritis-better with supportive care and application of Voltaren gel.  Watch and follow.   Hypernatremia: Resolved with IVF-currently with NG tube feedings  Hyponatremia: Probably due to free water/NG tube feeds-normalized after adjusting free water flushes and 1 dose of IV Lasix.    Transaminitis: Suspect this is multifactorial from statin/Rocephin/underlying bacteremia.  Abdomen is soft.  Statin discontinued on 10/19-LFTs have started to downtrend.  Continue to hold statin-and follow trend.     Normocytic anemia: Due to acute illness-no evidence of blood loss-watch/follow CBC closely.  PAF: Continue Tikosyn (pharmacy dosing)-initially on Eliquis-has been switched to IV heparin-in  case patient needs a PEG tube (see below  Acute left cerebellar vermis CVA: Felt to be a septic emboli in the setting of mitral valve endocarditis.    History of seizure disorder: Initially on Keppra-given lethargy/encephalopathy-switched to Vimpat on 10/16.  LTM EEG negative for seizures.    Recent SDH May 2022: This appears stable on recent imaging.  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  Continues to have significant left-sided hemiparesis.  On therapeutic anticoagulation with IV heparin-with plans to switch back to Eliquis prior to discharge.  History of rheumatic heart disease-s/p remote AVR/MVR (bioprosthetic valve)  Severe failure to thrive syndrome/frailty/poor oral intake/goals of care: Encephalopathy/failure to thrive syndrome have markedly improved-he is now starting to consume more oral intake.  Have switched to nocturnal NG tube feedings-calorie count in progress over the weekend.  Plan is to reevaluate on Monday to decide based on calorie count whether or not we should proceed with PEG tube feeds.  See goals of care discussion below.  Goals of care: Long discussion with spouse/2 sons on 10/20 by this MD, goals of care as follows. 1.DNR  2.Family fully well aware that it is unlikely that after this acute illness-patient will achieve his prior level of functioning/mental acuity.  However, if he is able to interact and enjoy some quality time with family/grandkids (even in a wheelchair)-they feel that this is something that the patient would want. 3.Calorie count over the weekend-if mental/clinical status continues to improve-okay to proceed with PEG tube placement if felt to be  needed. 4.Apart from PEG tube placement-no further aggressive care.  Family aware that patient is not a candidate for redo valve replacement surgery etc.   5.Family wants to give it several weeks-to allow for further mental status/functional improvement.Family okay with being discharged to SNF with peg tube (if needed)/PICC line-and palliative care follow-up.   6.If while at SNF-if felt to have no significant improvement in quality of life or if he suffers a significant decompensation-family willing to consider transitioning to comfort measures then.  7.They would like treatment with antibiotics/IV fluids/hospitalization for mild to moderate infection/electrolyte disorders. 8. Family aware that if in the interim (while hospitalized)-patient has significant decline/acute decompensation-organ damage/ICU level of care-family would be okay to transitioning to comfort measures in that event.    Nutrition Status: Nutrition Problem: Severe Malnutrition Etiology: chronic illness (CHF, Stroke) Signs/Symptoms: percent weight loss, moderate fat depletion, severe muscle depletion Percent weight loss: 12 % Interventions: Tube feeding, Prostat, MVI    Procedures: None Consults: Cardiology, neurology, infectious disease DVT Prophylaxis: Eliquis Code Status:DNR Family Communication: [name] at bedside  Time spent: 69 minutes-Greater than 50% of this time was spent in counseling, explanation of diagnosis, planning of further management, and coordination of care.  Diet: Diet Order             DIET DYS 3 Room service appropriate? Yes; Fluid consistency: Nectar Thick  Diet effective now                      Disposition Plan: Status is: Inpatient  Remains inpatient appropriate because:Inpatient level of care appropriate due to severity of illness  Dispo: The patient is from: SNF              Anticipated d/c is to: SNF              Patient currently is not medically  stable to d/c.    Difficult to place patient No    Barriers to Discharge: Persistent encephalopathy-persistent bacteremia with Enterococcus-on IV antibiotics-NG tube feedings ongoing due to poor oral intake.  Antimicrobial agents: Anti-infectives (From admission, onward)    Start     Dose/Rate Route Frequency Ordered Stop   12/25/20 1600  ampicillin (OMNIPEN) 2 g in sodium chloride 0.9 % 100 mL IVPB        2 g 300 mL/hr over 20 Minutes Intravenous Every 4 hours 12/25/20 1227 01/28/21 2359   12/18/20 1500  cefTRIAXone (ROCEPHIN) 2 g in sodium chloride 0.9 % 100 mL IVPB        2 g 200 mL/hr over 30 Minutes Intravenous Every 12 hours 12/18/20 1344 01/28/21 2359   12/15/20 1700  cefTRIAXone (ROCEPHIN) 1 g in sodium chloride 0.9 % 100 mL IVPB  Status:  Discontinued        1 g 200 mL/hr over 30 Minutes Intravenous Every 24 hours 12/14/20 1648 12/15/20 0624   12/15/20 0630  ampicillin (OMNIPEN) 2 g in sodium chloride 0.9 % 100 mL IVPB  Status:  Discontinued        2 g 300 mL/hr over 20 Minutes Intravenous Every 6 hours 12/15/20 0624 12/25/20 1227   12/15/20 0630  gentamicin (GARAMYCIN) IVPB 80 mg        80 mg 100 mL/hr over 30 Minutes Intravenous  Once 12/15/20 0628 12/15/20 0732   12/14/20 1645  cefTRIAXone (ROCEPHIN) 1 g in sodium chloride 0.9 % 100 mL IVPB        1 g 200 mL/hr over 30 Minutes Intravenous  Once 12/14/20 1642 12/14/20 1830        MEDICATIONS: Scheduled Meds:  amantadine  200 mg Per Tube BID   diclofenac Sodium  4 g Topical QID   dofetilide  375 mcg Oral BID   feeding supplement  237 mL Oral BID BM   feeding supplement (PROSource TF)  45 mL Per Tube Daily   finasteride  5 mg Oral Daily   free water  125 mL Per Tube Q8H   insulin aspart  0-9 Units Subcutaneous TID WC   lacosamide  50 mg Per Tube BID   magnesium gluconate  500 mg Per Tube Daily   mouth rinse  15 mL Mouth Rinse BID   multivitamin with minerals  1 tablet Per Tube Daily   sodium chloride flush  3 mL Intravenous Q12H    tamsulosin  0.4 mg Oral Daily   Continuous Infusions:  sodium chloride 10 mL/hr at 12/21/20 1204   sodium chloride     ampicillin (OMNIPEN) IV 2 g (12/26/20 1255)   cefTRIAXone (ROCEPHIN)  IV 2 g (12/26/20 1052)   feeding supplement (OSMOLITE 1.2 CAL) Stopped (12/26/20 0758)   heparin 1,200 Units/hr (12/26/20 0426)   PRN Meds:.sodium chloride, sodium chloride flush   I have personally reviewed following labs and imaging studies  LABORATORY DATA: CBC: Recent Labs  Lab 12/20/20 0153 12/21/20 0151 12/22/20 0224 12/23/20 0234 12/26/20 0300  WBC 9.1 9.9 10.0 9.4 10.8*  HGB 8.7* 8.8* 8.8* 8.4* 7.9*  HCT 26.8* 26.6* 26.4* 25.9* 24.7*  MCV 87.9 86.9 86.3 87.2 88.8  PLT 191 192 193 201 235     Basic Metabolic Panel: Recent Labs  Lab 12/19/20 1632 12/20/20 0153 12/22/20 0224 12/23/20 0234 12/24/20 0130 12/25/20 0058 12/26/20 0300  NA  --    < > 132* 127* 136 135 136  K  --    < >  4.1 3.9 4.0 4.1 3.9  CL  --    < > 102 98 103 103 103  CO2  --    < > 24 23 25 25 25   GLUCOSE  --    < > 209* 195* 187* 161* 147*  BUN  --    < > 15 15 18 21 17   CREATININE  --    < > 0.70 0.74 0.78 0.75 0.74  CALCIUM  --    < > 7.3* 7.1* 7.6* 7.7* 7.9*  MG 1.9   < > 1.9 2.3 2.0 2.0 1.9  PHOS 2.4*  --   --   --   --   --   --    < > = values in this interval not displayed.     GFR: Estimated Creatinine Clearance: 78.3 mL/min (by C-G formula based on SCr of 0.74 mg/dL).  Liver Function Tests: Recent Labs  Lab 12/22/20 0224 12/23/20 0234 12/24/20 0130 12/25/20 0058 12/26/20 0300  AST 100* 111* 111* 95* 69*  ALT 120* 139* 161* 151* 127*  ALKPHOS 74 79 85 90 85  BILITOT 0.4 0.4 0.2* 0.5 0.3  PROT 5.7* 5.6* 5.8* 5.8* 5.9*  ALBUMIN 1.5* <1.5* 1.5* 1.6* 1.6*    No results for input(s): LIPASE, AMYLASE in the last 168 hours. No results for input(s): AMMONIA in the last 168 hours.   Coagulation Profile: No results for input(s): INR, PROTIME in the last 168 hours.   Cardiac  Enzymes: No results for input(s): CKTOTAL, CKMB, CKMBINDEX, TROPONINI in the last 168 hours.  BNP (last 3 results) No results for input(s): PROBNP in the last 8760 hours.  Lipid Profile: No results for input(s): CHOL, HDL, LDLCALC, TRIG, CHOLHDL, LDLDIRECT in the last 72 hours.  Thyroid Function Tests: No results for input(s): TSH, T4TOTAL, FREET4, T3FREE, THYROIDAB in the last 72 hours.   Anemia Panel: No results for input(s): VITAMINB12, FOLATE, FERRITIN, TIBC, IRON, RETICCTPCT in the last 72 hours.  Urine analysis:    Component Value Date/Time   COLORURINE YELLOW 12/14/2020 1600   APPEARANCEUR CLOUDY (A) 12/14/2020 1600   LABSPEC 1.020 12/14/2020 1600   PHURINE 6.0 12/14/2020 1600   GLUCOSEU NEGATIVE 12/14/2020 1600   HGBUR LARGE (A) 12/14/2020 1600   BILIRUBINUR NEGATIVE 12/14/2020 1600   BILIRUBINUR negative 11/22/2013 0848   KETONESUR NEGATIVE 12/14/2020 1600   PROTEINUR NEGATIVE 12/14/2020 1600   UROBILINOGEN 0.2 11/22/2013 0848   UROBILINOGEN 1.0 08/05/2013 2207   NITRITE POSITIVE (A) 12/14/2020 1600   LEUKOCYTESUR LARGE (A) 12/14/2020 1600    Sepsis Labs: Lactic Acid, Venous    Component Value Date/Time   LATICACIDVEN 1.8 12/14/2020 2056    MICROBIOLOGY: Recent Results (from the past 240 hour(s))  Culture, blood (routine x 2)     Status: Abnormal   Collection Time: 12/17/20  8:01 AM   Specimen: BLOOD RIGHT HAND  Result Value Ref Range Status   Specimen Description BLOOD RIGHT HAND  Final   Special Requests   Final    BOTTLES DRAWN AEROBIC AND ANAEROBIC Blood Culture adequate volume   Culture  Setup Time   Final    GRAM POSITIVE COCCI IN CHAINS ANAEROBIC BOTTLE ONLY CRITICAL VALUE NOTED.  VALUE IS CONSISTENT WITH PREVIOUSLY REPORTED AND CALLED VALUE.    Culture (A)  Final    ENTEROCOCCUS FAECALIS SUSCEPTIBILITIES PERFORMED ON PREVIOUS CULTURE WITHIN THE LAST 5 DAYS. Performed at [facility], [place] [address] [place], [place] [address]     Report  Status 12/20/2020 FINAL  Final  Culture, blood (routine x 2)     Status: Abnormal   Collection Time: 12/17/20  8:03 AM   Specimen: BLOOD RIGHT WRIST  Result Value Ref Range Status   Specimen Description BLOOD RIGHT WRIST  Final   Special Requests   Final    BOTTLES DRAWN AEROBIC AND ANAEROBIC Blood Culture adequate volume   Culture  Setup Time   Final    GRAM POSITIVE COCCI ANAEROBIC BOTTLE ONLY CRITICAL VALUE NOTED.  VALUE IS CONSISTENT WITH PREVIOUSLY REPORTED AND CALLED VALUE.    Culture (A)  Final    ENTEROCOCCUS FAECALIS SUSCEPTIBILITIES PERFORMED ON PREVIOUS CULTURE WITHIN THE LAST 5 DAYS. Performed at [facility], [place] [address] [place], [place] [address]    Report Status 12/23/2020 FINAL  Final  Culture, blood (routine x 2)     Status: None   Collection Time: 12/19/20  7:46 AM   Specimen: BLOOD  Result Value Ref Range Status   Specimen Description BLOOD RIGHT ANTECUBITAL  Final   Special Requests   Final    BOTTLES DRAWN AEROBIC AND ANAEROBIC Blood Culture adequate volume   Culture   Final    NO GROWTH 5 DAYS Performed at [facility], [place] [address], [place], [place] [address]    Report Status 12/24/2020 FINAL  Final  Culture, blood (routine x 2)     Status: Abnormal   Collection Time: 12/19/20  7:47 AM   Specimen: BLOOD  Result Value Ref Range Status   Specimen Description BLOOD LEFT ANTECUBITAL  Final   Special Requests   Final    BOTTLES DRAWN AEROBIC AND ANAEROBIC Blood Culture adequate volume   Culture  Setup Time   Final    GRAM POSITIVE COCCI AEROBIC BOTTLE ONLY CRITICAL VALUE NOTED.  VALUE IS CONSISTENT WITH PREVIOUSLY REPORTED AND CALLED VALUE.    Culture (A)  Final    STAPHYLOCOCCUS EPIDERMIDIS THE SIGNIFICANCE OF ISOLATING THIS ORGANISM FROM A SINGLE SET OF BLOOD CULTURES WHEN MULTIPLE SETS ARE DRAWN IS UNCERTAIN. PLEASE NOTIFY THE MICROBIOLOGY DEPARTMENT WITHIN ONE WEEK IF SPECIATION AND SENSITIVITIES ARE REQUIRED. Performed at  [facility], [name] [address] [place], [place] [address]    Report Status 12/23/2020 FINAL  Final  Culture, blood (routine x 2)     Status: None   Collection Time: 12/21/20  6:32 PM   Specimen: BLOOD RIGHT WRIST  Result Value Ref Range Status   Specimen Description BLOOD RIGHT WRIST  Final   Special Requests   Final    BOTTLES DRAWN AEROBIC AND ANAEROBIC Blood Culture adequate volume   Culture   Final    NO GROWTH 5 DAYS Performed at [facility], [place] [address], [place], [place] [address]    Report Status 12/26/2020 FINAL  Final  Culture, blood (routine x 2)     Status: None   Collection Time: 12/21/20  6:40 PM   Specimen: BLOOD  Result Value Ref Range Status   Specimen Description BLOOD LEFT ANTECUBITAL  Final   Special Requests   Final    BOTTLES DRAWN AEROBIC AND ANAEROBIC Blood Culture adequate volume   Culture   Final    NO GROWTH 5 DAYS Performed at [facility], [place] [address] [place], [place] [address]    Report Status 12/26/2020 FINAL  Final    RADIOLOGY STUDIES/RESULTS: No results found.   LOS: 11 days   Oren Binet, MD  [facility]  To contact the attending provider between 7A-7P or the covering provider during after hours 7P-7A, please log into the web site [email or web address] and access using universal [place] password for that web site. If you do not have the password, please call the hospital operator.  12/26/2020, 2:17 PM

## 2020-12-26 NOTE — Progress Notes (Signed)
ANTICOAGULATION CONSULT NOTE  Pharmacy Consult:  Heparin (Apixaban on hold) Indication: atrial fibrillation  Allergies  Allergen Reactions   Naproxen Hives   No [employer] Food Allergies Other (See Comments)    Scallops - distress, nausea and vomitting    Patient Measurements: Height: 5\' 7"  (170.2 cm) Weight: 82.7 kg (182 lb 5.1 oz) IBW/kg (Calculated) : 66.1 Heparin Dosing Weight: 82 kg  Vital Signs: Temp: 97.7 F (36.5 C) (10/22 1209) Temp Source: Axillary (10/22 1209) BP: 127/82 (10/22 1209) Pulse Rate: 76 (10/22 1209)  Labs: Recent Labs    12/24/20 0130 12/25/20 0058 12/26/20 0300 12/26/20 1232  HGB  --   --  7.9*  --   HCT  --   --  24.7*  --   PLT  --   --  235  --   APTT  --   --  46* 67*  HEPARINUNFRC  --   --  1.00* 0.97*  CREATININE 0.78 0.75 0.74  --      Estimated Creatinine Clearance: 78.3 mL/min (by C-G formula based on SCr of 0.74 mg/dL).   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   Patient may need a PEG tube, so Pharmacy consulted to transition patient to IV heparin to prepare for the procedure.  CBC stable; no bleeding reported.  Noted patient has a recent history of SDH requiring bur hole evacuation in May and a CVA in June.  10/22 results:  aPTT 67 HL 0.97 Hgb 8.4>7.9   Goal of Therapy:  Heparin level 0.3-0.5 units/ml aPTT 66-85 seconds Monitor platelets by anticoagulation protocol: Yes   Plan:  Continue heparin to 1200 units/hr 2100 heparin level and aPTT Watch Hgb   Varney Daily, PharmD PGY1 Pharmacy Resident  Please check AMION for all [facility] pharmacy phone numbers After 10:00 PM call main pharmacy [phone]

## 2020-12-26 NOTE — Progress Notes (Signed)
ANTICOAGULATION CONSULT NOTE  Pharmacy Consult:  Heparin (Apixaban on hold) Indication: atrial fibrillation  Allergies  Allergen Reactions   Naproxen Hives   No [employer] Food Allergies Other (See Comments)    Scallops - distress, nausea and vomitting    Patient Measurements: Height: 5\' 7"  (170.2 cm) Weight: 82.7 kg (182 lb 5.1 oz) IBW/kg (Calculated) : 66.1 Heparin Dosing Weight: 82 kg  Vital Signs: Temp: 98 F (36.7 C) (10/22 2005) Temp Source: Axillary (10/22 2005) BP: 113/75 (10/22 2005) Pulse Rate: 79 (10/22 2005)  Labs: Recent Labs    12/24/20 0130 12/25/20 0058 12/26/20 0300 12/26/20 1232 12/26/20 2033  HGB  --   --  7.9*  --   --   HCT  --   --  24.7*  --   --   PLT  --   --  235  --   --   APTT  --   --  46* 67* 73*  HEPARINUNFRC  --   --  1.00* 0.97* 0.61  CREATININE 0.78 0.75 0.74  --   --      Estimated Creatinine Clearance: 78.3 mL/min (by C-G formula based on SCr of 0.74 mg/dL).   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  Patient may need a PEG tube, so Pharmacy consulted to transition patient to IV heparin to prepare for the procedure.  CBC stable; no bleeding reported.  Noted patient has a recent history of SDH requiring bur hole evacuation in May and a CVA in June.  Tonight aPTT is within goal range, heparin level slightly above goal but likely due to recent DOAC use.  Goal of Therapy:  Heparin level 0.3-0.5 units/ml aPTT 66-85 seconds Monitor platelets by anticoagulation protocol: Yes   Plan:  Continue heparin to 1200 units/hr Daily heparin level and aPTT  Arrie Senate, PharmD, BCPS, [facility] Clinical Pharmacist [phone] Please check AMION for all [place] numbers 12/26/2020

## 2020-12-26 NOTE — Progress Notes (Signed)
   12/26/20 0000  Assess: MEWS Score  Temp 99.2 F (37.3 C)  BP 117/70  Pulse Rate 71  ECG Heart Rate 71  Resp (!) 22  SpO2 96 %  O2 Device Room Air  Assess: MEWS Score  MEWS Temp 0  MEWS Systolic 0  MEWS Pulse 0  MEWS RR 1  MEWS LOC 1  MEWS Score 2  MEWS Score Color Yellow  Assess: if the MEWS score is Yellow or Red  Were vital signs taken at a resting state? Yes  Focused Assessment No change from prior assessment  Early Detection of Sepsis Score *See Row Information* Low  MEWS guidelines implemented *See Row Information* Yes  Treat  MEWS Interventions Administered scheduled meds/treatments  Pain Scale PAINAD  Breathing 0  Negative Vocalization 0  Facial Expression 0  Body Language 0  Consolability 0  PAINAD Score 0  Facial Expression 0  Body Movements 0  Muscle Tension 0  Compliance with ventilator (intubated pts.) N/A  Vocalization (extubated pts.) 0  CPOT Total 0  Take Vital Signs  Increase Vital Sign Frequency  Yellow: Q 2hr X 2 then Q 4hr X 2, if remains yellow, continue Q 4hrs  Escalate  MEWS: Escalate Yellow: discuss with charge nurse/RN and consider discussing with provider and RRT  Notify: Charge Nurse/RN  Name of Charge Nurse/RN Notified Elmyra Ricks  Date Charge Nurse/RN Notified 12/26/20  Time Charge Nurse/RN Notified 0140  Document  Patient Outcome Stabilized after interventions

## 2020-12-27 DIAGNOSIS — R400 Somnolence: Secondary | ICD-10-CM | POA: Diagnosis not present

## 2020-12-27 LAB — COMPREHENSIVE METABOLIC PANEL
ALT: 124 U/L — ABNORMAL HIGH (ref 0–44)
AST: 69 U/L — ABNORMAL HIGH (ref 15–41)
Albumin: 1.7 g/dL — ABNORMAL LOW (ref 3.5–5.0)
Alkaline Phosphatase: 93 U/L (ref 38–126)
Anion gap: 8 (ref 5–15)
BUN: 18 mg/dL (ref 8–23)
CO2: 24 mmol/L (ref 22–32)
Calcium: 7.8 mg/dL — ABNORMAL LOW (ref 8.9–10.3)
Chloride: 104 mmol/L (ref 98–111)
Creatinine, Ser: 0.86 mg/dL (ref 0.61–1.24)
GFR, Estimated: >60 mL/min (ref >60)
Glucose, Bld: 178 mg/dL — ABNORMAL HIGH (ref 70–99)
Potassium: 4.1 mmol/L (ref 3.5–5.1)
Sodium: 136 mmol/L (ref 135–145)
Total Bilirubin: 0.3 mg/dL (ref 0.3–1.2)
Total Protein: 6 g/dL — ABNORMAL LOW (ref 6.5–8.1)

## 2020-12-27 LAB — GLUCOSE, CAPILLARY
Glucose-Capillary: 128 mg/dL — ABNORMAL HIGH (ref 70–99)
Glucose-Capillary: 138 mg/dL — ABNORMAL HIGH (ref 70–99)
Glucose-Capillary: 149 mg/dL — ABNORMAL HIGH (ref 70–99)
Glucose-Capillary: 162 mg/dL — ABNORMAL HIGH (ref 70–99)
Glucose-Capillary: 166 mg/dL — ABNORMAL HIGH (ref 70–99)
Glucose-Capillary: 173 mg/dL — ABNORMAL HIGH (ref 70–99)
Glucose-Capillary: 176 mg/dL — ABNORMAL HIGH (ref 70–99)

## 2020-12-27 LAB — CBC
HCT: 26.4 % — ABNORMAL LOW (ref 39.0–52.0)
Hemoglobin: 8.2 g/dL — ABNORMAL LOW (ref 13.0–17.0)
MCH: 27.9 pg (ref 26.0–34.0)
MCHC: 31.1 g/dL (ref 30.0–36.0)
MCV: 89.8 fL (ref 80.0–100.0)
Platelets: 258 10*3/uL (ref 150–400)
RBC: 2.94 MIL/uL — ABNORMAL LOW (ref 4.22–5.81)
RDW: 16.4 % — ABNORMAL HIGH (ref 11.5–15.5)
WBC: 11.7 10*3/uL — ABNORMAL HIGH (ref 4.0–10.5)
nRBC: 0 % (ref 0.0–0.2)

## 2020-12-27 LAB — HEPARIN LEVEL (UNFRACTIONATED): Heparin Unfractionated: 0.55 IU/mL (ref 0.30–0.70)

## 2020-12-27 LAB — MAGNESIUM: Magnesium: 2.1 mg/dL (ref 1.7–2.4)

## 2020-12-27 LAB — APTT: aPTT: 77 seconds — ABNORMAL HIGH (ref 24–36)

## 2020-12-27 MED ORDER — DOXAZOSIN MESYLATE 1 MG PO TABS
1.0000 mg | ORAL_TABLET | Freq: Every day | ORAL | Status: DC
  Administered 2020-12-27 – 2020-12-29 (×3): 1 mg
  Filled 2020-12-27 (×4): qty 1

## 2020-12-27 NOTE — Progress Notes (Signed)
PROGRESS NOTE        PATIENT DETAILS Name: [name] [name] Age: 80 y.o. Sex: male Date of Birth: June 28, 1940 Admit Date: 12/14/2020 Admitting Physician Georgette Shell, MD BJS:[id number], Arvid Right, MD  Brief Narrative: Patient is a 80 y.o. 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  Hospital course complicated by prolonged encephalopathy. Thankully starting on 10/19-encephalopathy has started to improve.  See below for further details.   Pertinent Labs/Radiology: Hb: 7.9 Na: 136 K: 3.9  creatinine: 0.74  AST/ALT: 69/127  Microbiology: 10/10>>Blood culture: Enterococcus Enterococcus faecalis 10/10>> urine culture: Staff aureus 10/11>> blood culture: 1/2-Enterococcus faecalis 10/13>> blood culture: 1/2-Enterococcus faecalis 10/15>> blood culture: Staff epidermidis. 10/17>> blood culture: No growth  Radiology/Echo/EEG: 10/10>> CT head: Small right subdural hematoma 10/11>> TTE: EF-65-70%, no obvious vegetation. 10/13-10/14>> LTM EEG: No seizures. 10/14>> TEE: Prosthetic mitral valve vegetation 10/14>> MRI brain: Acute infarct left cerebellar vermis, small subacute hematoma in the right frontal lobe.   Subjective: In bed in no distress, answers few questions, denies any headache or chest pain, eating breakfast with assistance from wife.  Objective: Vitals: Blood pressure 114/68, pulse 85, temperature (!) 97.3 F (36.3 C), temperature source Oral, resp. rate 20, height 5'  7" (1.702 m), weight 83 kg, SpO2 98 %.   Exam:  Awake, weak, L sided weakness 0/5, Cor track in place [place].AT,PERRAL Supple Neck, No JVD,   Symmetrical Chest wall movement, Good air movement bilaterally, CTAB RRR,No Gallops, Rubs or new Murmurs,  +ve B.Sounds, Abd Soft, No tenderness,   No Cyanosis, Clubbing or edema,       Assessment/Plan: Acute metabolic encephalopathy: Due to hypernatremia/enterococcal bacteremia-had prolonged encephalopathy-but since 10/19-gradually improving.  This morning he was much more awake/alert and actively interacting with me-this is significantly better than what he was for the past 10 days.  Remains on amantadine 200 mg twice daily for arousal.  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  Repeat blood culture on 10/17 negative so far.  Now on Rocephin/ampicillin-with stop date of 01/28/2021 >> then amoxicillin 500 mg twice daily indefinitely.  Plan to place PICC line sometime next week prior to discharge.  Right wrist tenderness: Unclear etiology-no evidence of erythema swelling on exam.  Uric acid levels unremarkable.  X-ray suggestive of arthritis-better with supportive care and application of Voltaren gel.  Watch and follow.   Transaminitis: Suspect this is multifactorial from statin/Rocephin/underlying bacteremia.  Abdomen is soft.  Statin discontinued on 10/19-LFTs have started to downtrend.  Continue to hold statin-and follow trend.    Normocytic anemia: Due to acute illness-no evidence of blood loss-watch/follow CBC closely.  PAF: Continue Tikosyn (pharmacy dosing)-initially on Eliquis-has been switched to IV heparin-in case patient needs a PEG tube (see below  Acute left cerebellar vermis CVA: Felt to be a septic emboli in the  setting of mitral valve endocarditis.    History of seizure disorder: Initially  on Keppra-given lethargy/encephalopathy-switched to Vimpat on 10/16.  LTM EEG negative for seizures.    Recent SDH May 2022: This appears stable on recent imaging.  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  Continues to have significant left-sided hemiparesis.  On therapeutic anticoagulation with IV heparin-with plans to switch back to Eliquis prior to discharge.  History of rheumatic heart disease-s/p remote AVR/MVR (bioprosthetic valve)  Severe failure to thrive syndrome/frailty/poor oral intake/goals of care: Encephalopathy/failure to thrive syndrome have markedly improved-he is now starting to consume more oral intake.  Have switched to nocturnal NG tube feedings-calorie count in progress over the weekend.  Plan is to reevaluate on Monday to decide based on calorie count whether or not we should proceed with PEG tube feeds.  See goals of care discussion below.  Goals of care: Long discussion with spouse/2 sons on 10/20 by this MD, goals of care as follows. 1.DNR  2.Family fully well aware that it is unlikely that after this acute illness-patient will achieve his prior level of functioning/mental acuity.  However, if he is able to interact and enjoy some quality time with family/grandkids (even in a wheelchair)-they feel that this is something that the patient would want. 3.Calorie count over the weekend-if mental/clinical status continues to improve-okay to proceed with PEG tube placement if felt to be needed. 4.Apart from PEG tube placement-no further aggressive care.  Family aware that patient is not a candidate for redo valve replacement surgery etc.   5.Family wants to give it several weeks-to allow for further mental status/functional improvement.Family okay with being discharged to SNF with peg  tube (if needed)/PICC line-and palliative care follow-up.   6.If while at SNF-if felt to have no significant improvement in quality of life or if he suffers a significant decompensation-family willing to consider transitioning to comfort measures then.  7.They would like treatment with antibiotics/IV fluids/hospitalization for mild to moderate infection/electrolyte disorders. 8. Family aware that if in the interim (while hospitalized)-patient has significant decline/acute decompensation-organ damage/ICU level of care-family would be okay to transitioning to comfort measures in that event.    Nutrition Status: Nutrition Problem: Severe Malnutrition Etiology: chronic illness (CHF, Stroke) Signs/Symptoms: percent weight loss, moderate fat depletion, severe muscle depletion Percent weight loss: 12 % Interventions: Tube feeding, Prostat, MVI    Procedures: None Consults: Cardiology, neurology, infectious disease DVT Prophylaxis: Eliquis Code Status:DNR Family Communication: [name] at bedside 12/27/20  Time spent: 35 minutes-Greater than 50% of this time was spent in counseling, explanation of diagnosis, planning of further management, and coordination of care.  Diet: Diet Order             DIET DYS 3 Room service appropriate? Yes; Fluid consistency: Nectar Thick  Diet effective now                      Disposition Plan: Status is: Inpatient  Remains inpatient appropriate because:Inpatient level of care appropriate due to severity of illness  Dispo: The patient is from: SNF              Anticipated d/c is to: SNF              Patient currently is not medically stable to d/c.   Difficult to place patient No    Barriers to Discharge: Persistent encephalopathy-persistent bacteremia with Enterococcus-on IV antibiotics-NG tube feedings ongoing due to poor oral intake.  Antimicrobial agents:  Anti-infectives (From admission, onward)    Start     Dose/Rate Route  Frequency Ordered Stop   12/25/20 1600  ampicillin (OMNIPEN) 2 g in sodium chloride 0.9 % 100 mL IVPB        2 g 300 mL/hr over 20 Minutes Intravenous Every 4 hours 12/25/20 1227 01/28/21 2359   12/18/20 1500  cefTRIAXone (ROCEPHIN) 2 g in sodium chloride 0.9 % 100 mL IVPB        2 g 200 mL/hr over 30 Minutes Intravenous Every 12 hours 12/18/20 1344 01/28/21 2359   12/15/20 1700  cefTRIAXone (ROCEPHIN) 1 g in sodium chloride 0.9 % 100 mL IVPB  Status:  Discontinued        1 g 200 mL/hr over 30 Minutes Intravenous Every 24 hours 12/14/20 1648 12/15/20 0624   12/15/20 0630  ampicillin (OMNIPEN) 2 g in sodium chloride 0.9 % 100 mL IVPB  Status:  Discontinued        2 g 300 mL/hr over 20 Minutes Intravenous Every 6 hours 12/15/20 0624 12/25/20 1227   12/15/20 0630  gentamicin (GARAMYCIN) IVPB 80 mg        80 mg 100 mL/hr over 30 Minutes Intravenous  Once 12/15/20 0628 12/15/20 0732   12/14/20 1645  cefTRIAXone (ROCEPHIN) 1 g in sodium chloride 0.9 % 100 mL IVPB        1 g 200 mL/hr over 30 Minutes Intravenous  Once 12/14/20 1642 12/14/20 1830        MEDICATIONS: Scheduled Meds:  amantadine  200 mg Per Tube BID   diclofenac Sodium  4 g Topical QID   dofetilide  375 mcg Oral BID   feeding supplement  237 mL Oral BID BM   feeding supplement (PROSource TF)  45 mL Per Tube Daily   finasteride  5 mg Oral Daily   free water  125 mL Per Tube Q8H   insulin aspart  0-9 Units Subcutaneous TID WC   lacosamide  50 mg Per Tube BID   magnesium gluconate  500 mg Per Tube Daily   mouth rinse  15 mL Mouth Rinse BID   multivitamin with minerals  1 tablet Per Tube Daily   tamsulosin  0.4 mg Oral Daily   Continuous Infusions:  ampicillin (OMNIPEN) IV 2 g (12/27/20 0748)   cefTRIAXone (ROCEPHIN)  IV 2 g (12/27/20 1031)   feeding supplement (OSMOLITE 1.2 CAL) Stopped (12/27/20 0738)   heparin 1,200 Units/hr (12/27/20 0746)   PRN Meds:.REM   I have personally reviewed following labs and imaging  studies  LABORATORY DATA:  Recent Labs  Lab 12/21/20 0151 12/22/20 0224 12/23/20 0234 12/26/20 0300 12/27/20 0101  WBC 9.9 10.0 9.4 10.8* 11.7*  HGB 8.8* 8.8* 8.4* 7.9* 8.2*  HCT 26.6* 26.4* 25.9* 24.7* 26.4*  PLT 192 193 201 235 258  MCV 86.9 86.3 87.2 88.8 89.8  MCH 28.8 28.8 28.3 28.4 27.9  MCHC 33.1 33.3 32.4 32.0 31.1  RDW 16.1* 16.0* 15.9* 16.1* 16.4*    Recent Labs  Lab 12/23/20 0234 12/24/20 0130 12/25/20 0058 12/26/20 0300 12/27/20 0101  NA 127* 136 135 136 136  K 3.9 4.0 4.1 3.9 4.1  CL 98 103 103 103 104  CO2 23 25 25 25 24   GLUCOSE 195* 187* 161* 147* 178*  BUN 15 18 21 17 18   CREATININE 0.74 0.78 0.75 0.74 0.86  CALCIUM 7.1* 7.6* 7.7* 7.9* 7.8*  AST 111* 111* 95* 69* 69*  ALT 139* 161* 151* 127* 124*  ALKPHOS 79 85 90 85 93  BILITOT 0.4 0.2* 0.5 0.3 0.3  ALBUMIN <1.5* 1.5* 1.6* 1.6* 1.7*  MG 2.3 2.0 2.0 1.9 2.1          Urine analysis:    Component Value Date/Time   COLORURINE YELLOW 12/14/2020 1600   APPEARANCEUR CLOUDY (A) 12/14/2020 1600   LABSPEC 1.020 12/14/2020 1600   PHURINE 6.0 12/14/2020 1600   GLUCOSEU NEGATIVE 12/14/2020 1600   HGBUR LARGE (A) 12/14/2020 1600   BILIRUBINUR NEGATIVE 12/14/2020 1600   BILIRUBINUR negative 11/22/2013 0848   KETONESUR NEGATIVE 12/14/2020 1600   PROTEINUR NEGATIVE 12/14/2020 1600   UROBILINOGEN 0.2 11/22/2013 0848   UROBILINOGEN 1.0 08/05/2013 2207   NITRITE POSITIVE (A) 12/14/2020 1600   LEUKOCYTESUR LARGE (A) 12/14/2020 1600    Sepsis Labs: Lactic Acid, Venous    Component Value Date/Time   LATICACIDVEN 1.8 12/14/2020 2056     RADIOLOGY STUDIES/RESULTS: No results found.   LOS: 12 days   Signature  Lala Lund M.D on 12/27/2020 at 11:22 AM   -  To page go to [email or web address]

## 2020-12-27 NOTE — Plan of Care (Signed)
  Problem: Education: Goal: Knowledge of General Education information will improve Description: Including pain rating scale, medication(s)/side effects and non-pharmacologic comfort measures Outcome: Progressing   Problem: Health Behavior/Discharge Planning: Goal: Ability to manage health-related needs will improve Outcome: Progressing   Problem: Clinical Measurements: Goal: Ability to maintain clinical measurements within normal limits will improve Outcome: Progressing   Problem: Nutrition: Goal: Adequate nutrition will be maintained Outcome: Progressing   Problem: Coping: Goal: Level of anxiety will decrease Outcome: Progressing   Problem: Elimination: Goal: Will not experience complications related to urinary retention Outcome: Progressing   Problem: Elimination: Goal: Will not experience complications related to bowel motility Outcome: Progressing   Problem: Pain Managment: Goal: General experience of comfort will improve Outcome: Progressing   Problem: Safety: Goal: Ability to remain free from injury will improve Outcome: Progressing

## 2020-12-27 NOTE — Progress Notes (Signed)
ANTICOAGULATION CONSULT NOTE  Pharmacy Consult:  Heparin (Apixaban on hold) Indication: atrial fibrillation  Allergies  Allergen Reactions   Naproxen Hives   No [employer] Food Allergies Other (See Comments)    Scallops - distress, nausea and vomitting    Patient Measurements: Height: 5\' 7"  (170.2 cm) Weight: 83 kg (182 lb 15.7 oz) IBW/kg (Calculated) : 66.1 Heparin Dosing Weight: 82 kg  Vital Signs: Temp: 97.7 F (36.5 C) (10/23 0320) Temp Source: Axillary (10/23 0320) BP: 119/71 (10/23 0320) Pulse Rate: 79 (10/23 0507)  Labs: Recent Labs    12/25/20 0058 12/26/20 0300 12/26/20 0300 12/26/20 1232 12/26/20 2033 12/27/20 0101  HGB  --  7.9*  --   --   --  8.2*  HCT  --  24.7*  --   --   --  26.4*  PLT  --  235  --   --   --  258  APTT  --  46*   < > [name]* 73* 77*  HEPARINUNFRC  --  1.00*   < > 0.97* 0.61 0.55  CREATININE 0.75 0.74  --   --   --  0.86   < > = values in this interval not displayed.     Estimated Creatinine Clearance: 73 mL/min (by C-G formula based on SCr of 0.86 mg/dL).   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  Patient may need a PEG tube, so Pharmacy consulted to transition patient to IV heparin to prepare for the procedure.  CBC stable; no bleeding reported.  Noted patient has a recent history of SDH requiring bur hole evacuation in May and a CVA in June.  Tonight aPTT is within goal range, heparin level slightly above goal but likely due to recent DOAC use. Heparin level is still slightly above range at 0.55 and aPTT raised slightly to 77. The levels are starting to correlate and can follow up with daily levels. CBC stable.  Goal of Therapy:  Heparin level 0.3-0.5 units/ml aPTT 66-85 seconds Monitor platelets by anticoagulation protocol: Yes   Plan:  Continue heparin to 1200 units/hr Daily heparin level and aPTT   Varney Daily, PharmD PGY1 Pharmacy Resident  Please check AMION for all [facility] pharmacy phone numbers After 10:00 PM call main pharmacy [phone]

## 2020-12-28 ENCOUNTER — Inpatient Hospital Stay (HOSPITAL_COMMUNITY): Payer: Medicare Other

## 2020-12-28 DIAGNOSIS — R400 Somnolence: Secondary | ICD-10-CM | POA: Diagnosis not present

## 2020-12-28 LAB — COMPREHENSIVE METABOLIC PANEL
ALT: 96 U/L — ABNORMAL HIGH (ref 0–44)
AST: 45 U/L — ABNORMAL HIGH (ref 15–41)
Albumin: 1.7 g/dL — ABNORMAL LOW (ref 3.5–5.0)
Alkaline Phosphatase: 87 U/L (ref 38–126)
Anion gap: 7 (ref 5–15)
BUN: 19 mg/dL (ref 8–23)
CO2: 23 mmol/L (ref 22–32)
Calcium: 7.7 mg/dL — ABNORMAL LOW (ref 8.9–10.3)
Chloride: 103 mmol/L (ref 98–111)
Creatinine, Ser: 0.94 mg/dL (ref 0.61–1.24)
GFR, Estimated: >60 mL/min (ref >60)
Glucose, Bld: 157 mg/dL — ABNORMAL HIGH (ref 70–99)
Potassium: 4 mmol/L (ref 3.5–5.1)
Sodium: 133 mmol/L — ABNORMAL LOW (ref 135–145)
Total Bilirubin: 0.4 mg/dL (ref 0.3–1.2)
Total Protein: 5.9 g/dL — ABNORMAL LOW (ref 6.5–8.1)

## 2020-12-28 LAB — MAGNESIUM: Magnesium: 1.9 mg/dL (ref 1.7–2.4)

## 2020-12-28 LAB — CBC
HCT: 24.8 % — ABNORMAL LOW (ref 39.0–52.0)
Hemoglobin: 7.8 g/dL — ABNORMAL LOW (ref 13.0–17.0)
MCH: 28.4 pg (ref 26.0–34.0)
MCHC: 31.5 g/dL (ref 30.0–36.0)
MCV: 90.2 fL (ref 80.0–100.0)
Platelets: 250 10*3/uL (ref 150–400)
RBC: 2.75 MIL/uL — ABNORMAL LOW (ref 4.22–5.81)
RDW: 16.8 % — ABNORMAL HIGH (ref 11.5–15.5)
WBC: 11.9 10*3/uL — ABNORMAL HIGH (ref 4.0–10.5)
nRBC: 0.4 % — ABNORMAL HIGH (ref 0.0–0.2)

## 2020-12-28 LAB — GLUCOSE, CAPILLARY
Glucose-Capillary: 111 mg/dL — ABNORMAL HIGH (ref 70–99)
Glucose-Capillary: 141 mg/dL — ABNORMAL HIGH (ref 70–99)
Glucose-Capillary: 143 mg/dL — ABNORMAL HIGH (ref 70–99)
Glucose-Capillary: 154 mg/dL — ABNORMAL HIGH (ref 70–99)
Glucose-Capillary: 164 mg/dL — ABNORMAL HIGH (ref 70–99)
Glucose-Capillary: 180 mg/dL — ABNORMAL HIGH (ref 70–99)

## 2020-12-28 LAB — APTT: aPTT: 65 seconds — ABNORMAL HIGH (ref 24–36)

## 2020-12-28 LAB — HEPARIN LEVEL (UNFRACTIONATED): Heparin Unfractionated: 0.38 IU/mL (ref 0.30–0.70)

## 2020-12-28 MED ORDER — MAGNESIUM SULFATE IN D5W 1-5 GM/100ML-% IV SOLN
1.0000 g | Freq: Once | INTRAVENOUS | Status: AC
  Administered 2020-12-28: 1 g via INTRAVENOUS
  Filled 2020-12-28: qty 100

## 2020-12-28 NOTE — Progress Notes (Signed)
Patient ID: [name] [name], male   DOB: [birth year], 80 y.o.   MRN: [id number]     Advanced Heart Failure Rounding Note  PCP-Cardiologist: Loralie Champagne, MD   Subjective:    Wife present this morning, he is alert and ate some of his breakfast.   Blood cultures from 10/17 remain negative.  Afebrile.   Cortrak in place, getting tube feeds.   CXR today is clear.   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    TEE: EF 60-65%, RV probably normal, bioprosthetic AVR mean gradient 14 with trivial AI, bioprosthetic MV with trivial MR, mean gradient 8, vegetation on the sewing ring and also on the leaflets (relatively small size).     Objective:   Weight Range: 84.2 kg Body mass index is 29.07 kg/m.   Vital Signs:   Temp:  [97.1 F (36.2 C)-98.4 F (36.9 C)] 97.1 F (36.2 C) (10/24 0851) Pulse Rate:  [75-85] 79 (10/24 0851) Resp:  [21-25] 25 (10/24 0851) BP: (113-140)/(65-84) 130/76 (10/24 0851) SpO2:  [96 %-99 %] 97 % (10/24 0851) Weight:  [84.2 kg] 84.2 kg (10/24 0500) Last BM Date: 12/27/20  Weight change: Filed Weights   12/26/20 0500 12/27/20 0507 12/28/20 0500  Weight: 82.7 kg 83 kg 84.2 kg    Intake/Output:   Intake/Output Summary (Last 24 hours) at 12/28/2020 0923 Last data filed at 12/28/2020 0855 Gross per 24 hour  Intake 1295.28 ml  Output 1700 ml  Net -404.72 ml      Physical Exam    General: NAD Neck: No JVD, no thyromegaly or thyroid nodule.  Lungs: Clear to auscultation bilaterally with normal respiratory effort. CV: Nondisplaced PMI.  Heart regular S1/S2, no S3/S4, 1/6 SEM RUSB.  No peripheral edema.   Abdomen: Soft, nontender, no hepatosplenomegaly, no distention.  Skin: Intact without lesions or rashes.  Neurologic: Alert  Psych: Flat affect. Extremities: No clubbing or cyanosis.  HEENT: Normal.    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 Personally reviewed.   Labs     CBC Recent Labs    12/27/20 0101 12/28/20 0137  WBC 11.7* 11.9*  HGB 8.2* 7.8*  HCT 26.4* 24.8*  MCV 89.8 90.2  PLT 258 160   Basic Metabolic Panel Recent Labs    12/27/20 0101 12/28/20 0137  NA 136 133*  K 4.1 4.0  CL 104 103  CO2 24 23  GLUCOSE 178* 157*  BUN 18 19  CREATININE 0.86 0.94  CALCIUM 7.8* 7.7*  MG 2.1 1.9   Liver Function Tests Recent Labs    12/27/20 0101 12/28/20 0137  AST 69* 45*  ALT 124* 96*  ALKPHOS 93 87  BILITOT 0.3 0.4  PROT 6.0* 5.9*  ALBUMIN 1.7* 1.7*   No results for input(s): LIPASE, AMYLASE in the last 72 hours. Cardiac Enzymes No results for input(s): CKTOTAL, CKMB, CKMBINDEX, TROPONINI in the last 72 hours.  BNP: BNP (last 3 results) No results for input(s): BNP in the last 8760 hours.  ProBNP (last 3 results) No results for input(s): PROBNP in the last 8760 hours.   D-Dimer No results for input(s): DDIMER in the last 72 hours. Hemoglobin A1C No results for input(s): HGBA1C in the last 72 hours.  Fasting Lipid Panel No results for input(s): CHOL, HDL, LDLCALC, TRIG, CHOLHDL, LDLDIRECT in the last 72 hours. Thyroid Function Tests No results for input(s): TSH, T4TOTAL, T3FREE, THYROIDAB in the  last 72 hours.  Invalid input(s): FREET3  Other results:   Imaging    DG Chest Port 1 View  Result Date: 12/28/2020 CLINICAL DATA:  Shortness of breath, CHF EXAM: PORTABLE CHEST 1 VIEW COMPARISON:  Portable exam 0000 hours compared to 12/14/2020 FINDINGS: Feeding tube traverses esophagus into stomach. Enlargement of cardiac silhouette post median sternotomy, MVR, and AVR. Atherosclerotic calcification aorta. Minimal bibasilar atelectasis. Lungs otherwise clear. No infiltrate, pleural effusion, or pneumothorax. No acute osseous findings. IMPRESSION: Minimal bibasilar atelectasis. Enlargement of cardiac silhouette post MVR and AVR. Electronically Signed   By: Lavonia Dana M.D.   On: 12/28/2020 08:36     Medications:      Scheduled Medications:  amantadine  200 mg Per Tube BID   diclofenac Sodium  4 g Topical QID   dofetilide  375 mcg Oral BID   doxazosin  1 mg Per Tube Daily   feeding supplement  237 mL Oral BID BM   feeding supplement (PROSource TF)  45 mL Per Tube Daily   finasteride  5 mg Oral Daily   free water  125 mL Per Tube Q8H   insulin aspart  0-9 Units Subcutaneous TID WC   lacosamide  50 mg Per Tube BID   magnesium gluconate  500 mg Per Tube Daily   mouth rinse  15 mL Mouth Rinse BID   multivitamin with minerals  1 tablet Per Tube Daily    Infusions:  ampicillin (OMNIPEN) IV 2 g (12/28/20 0857)   cefTRIAXone (ROCEPHIN)  IV 2 g (12/28/20 0922)   feeding supplement (OSMOLITE 1.2 CAL) 900 mL (12/27/20 2043)   heparin 1,200 Units/hr (12/28/20 0452)    PRN Medications: REM   Assessment/Plan   1. 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  Baseline, he is bed-bound and per wife communicates minimally, left hemiparesis.  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  He is alert this morning but still not communicating at prior baseline per his wife.  2. Hypernatremia: Resolved. 3. Enterococcal bacteremia: Source uncertain.  No PNA noted on CXR.  Enterococcus did not grow in urine.  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  He has a new left cerebellar CVA (small) that may be embolic from MV vegetation.  Blood cultures from 10/10, 10/11, 10/13 were positive.  Cultures from 10/15 with S epidermidis, likely contaminant.  Resent 10/17, these cultures remain negative. Afebrile, WBCs 9.4.   - Continues on ceftriaxone and ampicillin.  - ID  following.  - Not candidate for cardiac surgery, management will have to be medical.  4. 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  Echo this admission with EF 65-70%, normal RV, bioprosthetic AVR mean gradient 16 mmHg appears normal, bioprosthetic MV mean gradient 10, increased from prior but HR also in 100s when study done.  TEE as above showed bioprosthetic MV vegetation with trivial MR and mean gradient 8.  5. Atrial fibrillation/Flutter: [place] in 2006 + 2 flutter/fib ablations at [facility]. 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 Tikosyn decreased to 375 mcg bid, I think QTc is acceptable today.  - Continue Tikosyn 375 mcg bid. Will recheck ECG in am.  -  Continue Eliquis.  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  6. Malnutrition: Albumin 1.5.  TFs via Cortrak.  7. Elevated LFTs: Statin stopped.  LFTs trending down.    Length of Stay: 69  Loralie Champagne, MD  12/28/2020, 9:23 AM  Advanced Heart Failure Team Pager [phone] (M-F; 7a - 5p)  Please contact [facility] for night-coverage after hours (5p -7a ) and weekends on [email or web address]

## 2020-12-28 NOTE — Progress Notes (Signed)
Calorie Count Note  48 hour calorie count ordered.  Diet: dysphagia 3, nectar thick liquids Supplements: Hormel Shake TID with meals  Spoke with pt and wife at bedside. He is much more alert and interactive compared to prior visit. Noted pt has consumed 100% of meal tray (799 kcals and 30 grams protein). Also brought pt Magic Cup due to complaints of hunger. Pt wife is very attentive and assists with feeding him.   Case discussed with Dr. Candiss Norse and RN. Plan to d/c cortrak and TF.   10/22 Breakfast: 819 kcals, 37 grams protein Lunch: 247 kcals, 12 grams protein Dinner: 248 kcals, 11 grams protein  Total intake: 1314 kcal (63% of minimum estimated needs)  60 grams protein (57% of minimum estimated needs)  10/23 Breakfast: 895 kcals, 39 grams protein Lunch: 782 kcals, 52 grams protein Dinner: 426 kcals, 20 grams protein  Total intake: 2103 kcal (100% of minimum estimated needs)  111 protein (100% of minimum estimated needs)  Average Total intake: 1709 kcal (81% of minimum estimated needs)  86 protein (81% of minimum estimated needs)  Nutrition Dx: Severe Malnutrition related to chronic illness (CHF, Stroke) as evidenced by percent weight loss, moderate fat depletion, severe muscle depletion; ongoing  Goal: Patient will meet greater than or equal to 90% of their needs; progressing   Intervention:   -D/c calorie count -D/c TF -Hormel Shake TID with meals, each supplement provides 520 kcals and 22 grams protein -MVI with minerals daily  Loistine Chance, RD, LDN, [place] Registered Dietitian II Certified Diabetes Care and Education Specialist Please refer to [facility] for RD and/or RD on-call/weekend/after hours pager

## 2020-12-28 NOTE — Progress Notes (Signed)
ANTICOAGULATION CONSULT NOTE  Pharmacy Consult:  Heparin (Apixaban on hold) Indication: atrial fibrillation  Allergies  Allergen Reactions   Naproxen Hives   No [employer] Food Allergies Other (See Comments)    Scallops - distress, nausea and vomitting    Patient Measurements: Height: 5\' 7"  (170.2 cm) Weight: 84.2 kg (185 lb 10 oz) IBW/kg (Calculated) : 66.1 Heparin Dosing Weight: 82 kg  Vital Signs: Temp: 97.1 F (36.2 C) (10/24 0851) Temp Source: Oral (10/24 0851) BP: 130/76 (10/24 0851) Pulse Rate: 79 (10/24 0851)  Labs: Recent Labs    12/26/20 0300 12/26/20 1232 12/26/20 2033 12/27/20 0101 12/28/20 0137  HGB 7.9*  --   --  8.2* 7.8*  HCT 24.7*  --   --  26.4* 24.8*  PLT 235  --   --  258 250  APTT 46*   < > 73* 77* 65*  HEPARINUNFRC 1.00*   < > 0.61 0.55 0.38  CREATININE 0.74  --   --  0.86 0.94   < > = values in this interval not displayed.     Estimated Creatinine Clearance: 67.1 mL/min (by C-G formula based on SCr of 0.94 mg/dL).   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  Patient may need a PEG tube, so Pharmacy consulted to transition patient to IV heparin to prepare for the procedure.  CBC stable; no bleeding reported.  Noted patient has a recent history of SDH requiring bur hole evacuation in May and a CVA in June.  Heparin level therapeutic at 0.38, aPTT just slightly low at 65. Hgb 7.8, plt 250. No s/sx of bleeding or infusion issues.   Goal of Therapy:  Heparin level 0.3-0.5 units/ml aPTT 66-85 seconds Monitor platelets by anticoagulation protocol: Yes   Plan:  Increase heparin to 1250 units/hr to get into goal range  Daily heparin level and aPTT  Antonietta Jewel, PharmD, [place] Pharmacist  Phone: [phone] 12/28/2020 10:59 AM  Please check AMION for all [place] phone numbers After 10:00 PM, call [place] [phone]

## 2020-12-28 NOTE — Progress Notes (Signed)
PROGRESS NOTE        PATIENT DETAILS Name: [name] [name] Age: 80 y.o. Sex: male Date of Birth: [birth year] Admit Date: 12/14/2020 Admitting Physician Georgette Shell, MD TDD:[id number], Arvid Right, MD  Brief Narrative: Patient is a 80 y.o. 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  Hospital course complicated by prolonged encephalopathy. Thankully starting on 10/19-encephalopathy has started to improve.  See below for further details.   Pertinent Labs/Radiology: Hb: 7.9 Na: 136 K: 3.9  creatinine: 0.74  AST/ALT: 69/127  Microbiology: 10/10>>Blood culture: Enterococcus Enterococcus faecalis 10/10>> urine culture: Staff aureus 10/11>> blood culture: 1/2-Enterococcus faecalis 10/13>> blood culture: 1/2-Enterococcus faecalis 10/15>> blood culture: Staff epidermidis. 10/17>> blood culture: No growth  Radiology/Echo/EEG: 10/10>> CT head: Small right subdural hematoma 10/11>> TTE: EF-65-70%, no obvious vegetation. 10/13-10/14>> LTM EEG: No seizures. 10/14>> TEE: Prosthetic mitral valve vegetation 10/14>> MRI brain: Acute infarct left cerebellar vermis, small subacute hematoma in the right frontal lobe.   Subjective:  Patient in bed, appears comfortable, denies any headache, no fever, no chest pain or pressure, no shortness of breath , no abdominal pain. No new focal weakness.   Objective: Vitals: Blood pressure 130/76, pulse 79, temperature (!) 97.1 F (36.2 C),  temperature source Oral, resp. rate (!) 25, height 5\' 7"  (1.702 m), weight 84.2 kg, SpO2 97 %.   Exam:  Awake, weak, L sided weakness 0/5, Cor track in place [place].AT,PERRAL Supple Neck, No JVD,   Symmetrical Chest wall movement, Good air movement bilaterally, CTAB RRR,No Gallops, Rubs or new Murmurs,  +ve B.Sounds, Abd Soft, No tenderness,   No Cyanosis, Clubbing or edema,      Assessment/Plan:  Acute metabolic encephalopathy: Due to hypernatremia/enterococcal bacteremia-had prolonged encephalopathy-but since 10/19-gradually improving.  This morning he was much more awake/alert and actively interacting with me-this is significantly better than what he was for the past 10 days.  Remains on amantadine 200 mg twice daily for arousal.  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  Repeat blood culture on 10/17 negative so far.  Now on Rocephin/ampicillin-with stop date of 01/28/2021 >> then amoxicillin 500 mg twice daily indefinitely.  Plan to place PICC line sometime next week prior to discharge.  Right wrist tenderness: Unclear etiology-no evidence of erythema swelling on exam.  Uric acid levels unremarkable.  X-ray suggestive of arthritis-better with supportive care and application of Voltaren gel.  Watch and follow.   Transaminitis: Suspect this is multifactorial from statin/Rocephin/underlying bacteremia.  Abdomen is soft.  Statin discontinued on 10/19-LFTs have started to downtrend.  Continue to hold statin-and follow trend.    Normocytic anemia: Due to acute illness-no evidence of blood loss-watch/follow CBC closely.  PAF: Continue Tikosyn (pharmacy dosing)-initially on Eliquis-has been switched to IV heparin-in case patient needs a PEG tube (see below  Acute left  cerebellar vermis CVA: Felt to be a septic emboli in the setting of mitral  valve endocarditis.    History of seizure disorder: Initially on Keppra-given lethargy/encephalopathy-switched to Vimpat on 10/16.  LTM EEG negative for seizures.    Recent SDH May 2022: This appears stable on recent imaging.  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  Continues to have significant left-sided hemiparesis.  On therapeutic anticoagulation with IV heparin-with plans to switch back to Eliquis prior to discharge.  History of rheumatic heart disease-s/p remote AVR/MVR (bioprosthetic valve)  Severe failure to thrive syndrome/frailty/poor oral intake/goals of care: Encephalopathy/failure to thrive syndrome have markedly improved-he is now starting to consume more oral intake.  Have switched to nocturnal NG tube feedings-calorie count in progress over the weekend.  Plan is to reevaluate on Monday to decide based on calorie count whether or not we should proceed with PEG tube feeds.  See goals of care discussion below.  Goals of care: Long discussion with spouse/2 sons on 10/20 by this MD, goals of care as follows. 1.DNR  2.Family fully well aware that it is unlikely that after this acute illness-patient will achieve his prior level of functioning/mental acuity.  However, if he is able to interact and enjoy some quality time with family/grandkids (even in a wheelchair)-they feel that this is something that the patient would want. 3.Calorie count over the weekend-if mental/clinical status continues to improve-okay to proceed with PEG tube placement if felt to be needed. 4.Apart from PEG tube placement-no further aggressive care.  Family aware that patient is not a candidate for redo valve replacement surgery etc.   5.Family wants to give it several weeks-to allow for further mental status/functional  improvement.Family okay with being discharged to SNF with peg tube (if needed)/PICC line-and palliative care follow-up.   6.If while at SNF-if felt to have no significant improvement in quality of life or if he suffers a significant decompensation-family willing to consider transitioning to comfort measures then.  7.They would like treatment with antibiotics/IV fluids/hospitalization for mild to moderate infection/electrolyte disorders. 8. Family aware that if in the interim (while hospitalized)-patient has significant decline/acute decompensation-organ damage/ICU level of care-family would be okay to transitioning to comfort measures in that event.    Nutrition Status: Nutrition Problem: Severe Malnutrition Etiology: chronic illness (CHF, Stroke) Signs/Symptoms: percent weight loss, moderate fat depletion, severe muscle depletion Percent weight loss: 12 % Interventions: Tube feeding, Prostat, MVI    Procedures: None Consults: Cardiology, neurology, infectious disease DVT Prophylaxis: Eliquis Code Status:DNR Family Communication: [name] at bedside 12/27/20, 12/28/20  Time spent: 35 minutes-Greater than 50% of this time was spent in counseling, explanation of diagnosis, planning of further management, and coordination of care.  Diet: Diet Order             DIET DYS 3 Room service appropriate? Yes; Fluid consistency: Nectar Thick  Diet effective now                      Disposition Plan: Status is: Inpatient  Remains inpatient appropriate because:Inpatient level of care appropriate due to severity of illness  Dispo: The patient is from: SNF              Anticipated d/c is to: SNF              Patient currently is not medically stable to d/c.   Difficult to place patient No    Barriers to Discharge: Persistent encephalopathy-persistent bacteremia with Enterococcus-on IV antibiotics-NG  tube feedings ongoing due to poor oral intake.  Antimicrobial  agents: Anti-infectives (From admission, onward)    Start     Dose/Rate Route Frequency Ordered Stop   12/25/20 1600  ampicillin (OMNIPEN) 2 g in sodium chloride 0.9 % 100 mL IVPB        2 g 300 mL/hr over 20 Minutes Intravenous Every 4 hours 12/25/20 1227 01/28/21 2359   12/18/20 1500  cefTRIAXone (ROCEPHIN) 2 g in sodium chloride 0.9 % 100 mL IVPB        2 g 200 mL/hr over 30 Minutes Intravenous Every 12 hours 12/18/20 1344 01/28/21 2359   12/15/20 1700  cefTRIAXone (ROCEPHIN) 1 g in sodium chloride 0.9 % 100 mL IVPB  Status:  Discontinued        1 g 200 mL/hr over 30 Minutes Intravenous Every 24 hours 12/14/20 1648 12/15/20 0624   12/15/20 0630  ampicillin (OMNIPEN) 2 g in sodium chloride 0.9 % 100 mL IVPB  Status:  Discontinued        2 g 300 mL/hr over 20 Minutes Intravenous Every 6 hours 12/15/20 0624 12/25/20 1227   12/15/20 0630  gentamicin (GARAMYCIN) IVPB 80 mg        80 mg 100 mL/hr over 30 Minutes Intravenous  Once 12/15/20 0628 12/15/20 0732   12/14/20 1645  cefTRIAXone (ROCEPHIN) 1 g in sodium chloride 0.9 % 100 mL IVPB        1 g 200 mL/hr over 30 Minutes Intravenous  Once 12/14/20 1642 12/14/20 1830        MEDICATIONS: Scheduled Meds:  amantadine  200 mg Per Tube BID   diclofenac Sodium  4 g Topical QID   dofetilide  375 mcg Oral BID   doxazosin  1 mg Per Tube Daily   feeding supplement  237 mL Oral BID BM   feeding supplement (PROSource TF)  45 mL Per Tube Daily   finasteride  5 mg Oral Daily   free water  125 mL Per Tube Q8H   insulin aspart  0-9 Units Subcutaneous TID WC   lacosamide  50 mg Per Tube BID   magnesium gluconate  500 mg Per Tube Daily   mouth rinse  15 mL Mouth Rinse BID   multivitamin with minerals  1 tablet Per Tube Daily   Continuous Infusions:  ampicillin (OMNIPEN) IV 2 g (12/28/20 0857)   cefTRIAXone (ROCEPHIN)  IV 2 g (12/28/20 0922)   feeding supplement (OSMOLITE 1.2 CAL) 900 mL (12/27/20 2043)   heparin 1,200 Units/hr (12/28/20  0452)   magnesium sulfate bolus IVPB     PRN Meds:.REM   I have personally reviewed following labs and imaging studies  LABORATORY DATA:  Recent Labs  Lab 12/22/20 0224 12/23/20 0234 12/26/20 0300 12/27/20 0101 12/28/20 0137  WBC 10.0 9.4 10.8* 11.7* 11.9*  HGB 8.8* 8.4* 7.9* 8.2* 7.8*  HCT 26.4* 25.9* 24.7* 26.4* 24.8*  PLT 193 201 235 258 250  MCV 86.3 87.2 88.8 89.8 90.2  MCH 28.8 28.3 28.4 27.9 28.4  MCHC 33.3 32.4 32.0 31.1 31.5  RDW 16.0* 15.9* 16.1* 16.4* 16.8*    Recent Labs  Lab 12/24/20 0130 12/25/20 0058 12/26/20 0300 12/27/20 0101 12/28/20 0137  NA 136 135 136 136 133*  K 4.0 4.1 3.9 4.1 4.0  CL 103 103 103 104 103  CO2 25 25 25 24 23   GLUCOSE 187* 161* 147* 178* 157*  BUN 18 21 17 18 19   CREATININE 0.78 0.75 0.74 0.86 0.94  CALCIUM 7.6* 7.7* 7.9* 7.8* 7.7*  AST 111* 95* 69* 69* 45*  ALT 161* 151* 127* 124* 96*  ALKPHOS 85 90 85 93 87  BILITOT 0.2* 0.5 0.3 0.3 0.4  ALBUMIN 1.5* 1.6* 1.6* 1.7* 1.7*  MG 2.0 2.0 1.9 2.1 1.9          Urine analysis:    Component Value Date/Time   COLORURINE YELLOW 12/14/2020 1600   APPEARANCEUR CLOUDY (A) 12/14/2020 1600   LABSPEC 1.020 12/14/2020 1600   PHURINE 6.0 12/14/2020 1600   GLUCOSEU NEGATIVE 12/14/2020 1600   HGBUR LARGE (A) 12/14/2020 1600   BILIRUBINUR NEGATIVE 12/14/2020 1600   BILIRUBINUR negative 11/22/2013 0848   KETONESUR NEGATIVE 12/14/2020 1600   PROTEINUR NEGATIVE 12/14/2020 1600   UROBILINOGEN 0.2 11/22/2013 0848   UROBILINOGEN 1.0 08/05/2013 2207   NITRITE POSITIVE (A) 12/14/2020 1600   LEUKOCYTESUR LARGE (A) 12/14/2020 1600    Sepsis Labs: Lactic Acid, Venous    Component Value Date/Time   LATICACIDVEN 1.8 12/14/2020 2056     RADIOLOGY STUDIES/RESULTS: DG Chest Port 1 View  Result Date: 12/28/2020 CLINICAL DATA:  Shortness of breath, CHF EXAM: PORTABLE CHEST 1 VIEW COMPARISON:  Portable exam 0000 hours compared to 12/14/2020 FINDINGS: Feeding tube traverses esophagus  into stomach. Enlargement of cardiac silhouette post median sternotomy, MVR, and AVR. Atherosclerotic calcification aorta. Minimal bibasilar atelectasis. Lungs otherwise clear. No infiltrate, pleural effusion, or pneumothorax. No acute osseous findings. IMPRESSION: Minimal bibasilar atelectasis. Enlargement of cardiac silhouette post MVR and AVR. Electronically Signed   By: Lavonia Dana M.D.   On: 12/28/2020 08:36     LOS: 13 days   Signature  Lala Lund M.D on 12/28/2020 at 11:45 AM   -  To page go to [email or web address]

## 2020-12-29 ENCOUNTER — Inpatient Hospital Stay (HOSPITAL_COMMUNITY): Payer: Medicare Other

## 2020-12-29 DIAGNOSIS — R400 Somnolence: Secondary | ICD-10-CM | POA: Diagnosis not present

## 2020-12-29 LAB — GLUCOSE, CAPILLARY
Glucose-Capillary: 96 mg/dL (ref 70–99)
Glucose-Capillary: 91 mg/dL (ref 70–99)
Glucose-Capillary: 130 mg/dL — ABNORMAL HIGH (ref 70–99)
Glucose-Capillary: 125 mg/dL — ABNORMAL HIGH (ref 70–99)
Glucose-Capillary: 163 mg/dL — ABNORMAL HIGH (ref 70–99)

## 2020-12-29 LAB — HEPARIN LEVEL (UNFRACTIONATED): Heparin Unfractionated: 0.3 IU/mL (ref 0.30–0.70)

## 2020-12-29 LAB — CBC
HCT: 23.8 % — ABNORMAL LOW (ref 39.0–52.0)
Hemoglobin: 7.5 g/dL — ABNORMAL LOW (ref 13.0–17.0)
MCH: 28.1 pg (ref 26.0–34.0)
MCHC: 31.5 g/dL (ref 30.0–36.0)
MCV: 89.1 fL (ref 80.0–100.0)
Platelets: 243 10*3/uL (ref 150–400)
RBC: 2.67 MIL/uL — ABNORMAL LOW (ref 4.22–5.81)
RDW: 17.2 % — ABNORMAL HIGH (ref 11.5–15.5)
WBC: 12.4 10*3/uL — ABNORMAL HIGH (ref 4.0–10.5)
nRBC: 0.2 % (ref 0.0–0.2)

## 2020-12-29 LAB — COMPREHENSIVE METABOLIC PANEL
ALT: 80 U/L — ABNORMAL HIGH (ref 0–44)
AST: 41 U/L (ref 15–41)
Albumin: 1.7 g/dL — ABNORMAL LOW (ref 3.5–5.0)
Alkaline Phosphatase: 87 U/L (ref 38–126)
Anion gap: 7 (ref 5–15)
BUN: 17 mg/dL (ref 8–23)
CO2: 24 mmol/L (ref 22–32)
Calcium: 7.7 mg/dL — ABNORMAL LOW (ref 8.9–10.3)
Chloride: 104 mmol/L (ref 98–111)
Creatinine, Ser: 0.82 mg/dL (ref 0.61–1.24)
GFR, Estimated: >60 mL/min (ref >60)
Glucose, Bld: 103 mg/dL — ABNORMAL HIGH (ref 70–99)
Potassium: 3.8 mmol/L (ref 3.5–5.1)
Sodium: 135 mmol/L (ref 135–145)
Total Bilirubin: 0.6 mg/dL (ref 0.3–1.2)
Total Protein: 5.8 g/dL — ABNORMAL LOW (ref 6.5–8.1)

## 2020-12-29 LAB — BRAIN NATRIURETIC PEPTIDE: B Natriuretic Peptide: 207.1 pg/mL — ABNORMAL HIGH (ref 0.0–100.0)

## 2020-12-29 LAB — PROCALCITONIN: Procalcitonin: 0.13 ng/mL

## 2020-12-29 LAB — APTT: aPTT: 72 seconds — ABNORMAL HIGH (ref 24–36)

## 2020-12-29 LAB — MAGNESIUM: Magnesium: 2 mg/dL (ref 1.7–2.4)

## 2020-12-29 MED ORDER — POTASSIUM CHLORIDE CRYS ER 20 MEQ PO TBCR
30.0000 meq | EXTENDED_RELEASE_TABLET | Freq: Once | ORAL | Status: AC
  Administered 2020-12-29: 30 meq via ORAL
  Filled 2020-12-29: qty 1

## 2020-12-29 MED ORDER — SIMETHICONE 40 MG/0.6ML PO SUSP
80.0000 mg | Freq: Once | ORAL | Status: AC
  Administered 2020-12-29: 80 mg via ORAL
  Filled 2020-12-29: qty 1.2

## 2020-12-29 NOTE — Progress Notes (Signed)
Physical Therapy Treatment Patient Details Name: [name] [name] MRN: [id number] DOB: February 16, 1941 Today's Date: 12/29/2020   History of Present Illness 80 y/o male presented to ED on 10/10 for AMS and hypernatremia. TEE suggestive of bioprosthetic valve endocarditis. 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    PT Comments    Patient much more alert and able to follow multiple simple commands (stick out tongue, smile, wiggle toes), stated his last name, and even wiped his eyes with washcloth (using rt hand) on command. Reassessment completed (per goals) and plan and goals updated based on pt's progress and ability to participate.     Recommendations for follow up therapy are one component of a multi-disciplinary discharge planning process, led by the attending physician.  Recommendations may be updated based on patient status, additional functional criteria and insurance authorization.  Follow Up Recommendations  Skilled nursing-short term rehab (<3 hours/day)     Assistance Recommended at Discharge Frequent or constant Supervision/Assistance  Equipment Recommendations  None recommended by PT    Recommendations for Other Services       Precautions / Restrictions Precautions Precautions: Fall     Mobility  Bed Mobility Overal bed mobility: Needs Assistance Bed Mobility: Supine to Sit     Supine to sit: Total assist     General bed mobility comments: chair-like position in bed; pt not able to try to pull his torso away from the bed    Transfers                        Ambulation/Gait                 Stairs             Wheelchair Mobility    Modified Rankin (Stroke Patients Only)       Balance                                            Cognition  Arousal/Alertness: Awake/alert (initially asleep, but once awake maintained eyes open x 15 minutes) Behavior During Therapy: Flat affect Overall Cognitive Status: Difficult to assess                                 General Comments: able to state his last name; followed multiple one step commands (stick out tongue, smile, wiggle toes, squeeze (rt hand) and even "wipe your eyes" when washcloth put in rt hand        Exercises Other Exercises Other Exercises: AAROM RUE, RLE with pt resisting AAROM of LLE (would actively wiggle toes); PROM LUE with spontaneous movement of left fingers    General Comments        Pertinent Vitals/Pain Breathing: normal Negative Vocalization: none Facial Expression: smiling or inexpressive Body Language: relaxed Consolability: no need to console PAINAD Score: 0    Home Living                          Prior Function            PT Goals (current goals can now be  found in the care plan section) Acute Rehab PT Goals Patient Stated Goal: unable PT Goal Formulation: With family Time For Goal Achievement: 01/12/21 Potential to Achieve Goals: Fair Progress towards PT goals: Goals met and updated - see care plan    Frequency    Min 2X/week      PT Plan Discharge plan needs to be updated;Frequency needs to be updated    Co-evaluation              AM-PAC PT "6 Clicks" Mobility   Outcome Measure  Help needed turning from your back to your side while in a flat bed without using bedrails?: Total Help needed moving from lying on your back to sitting on the side of a flat bed without using bedrails?: Total Help needed moving to and from a bed to a chair (including a wheelchair)?: Total Help needed standing up from a chair using your arms (e.g., wheelchair or bedside chair)?: Total Help needed to walk in hospital room?: Total Help needed climbing 3-5 steps with a railing? : Total 6 Click Score: 6    End of Session    Activity Tolerance: Patient tolerated treatment well Patient left: in bed;with family/visitor present;with bed alarm set Nurse Communication: Other (comment) (pt needs to be changed/wet) PT Visit Diagnosis: Hemiplegia and hemiparesis Hemiplegia - Right/Left: Left Hemiplegia - dominant/non-dominant: Non-dominant Hemiplegia - caused by: Cerebral infarction     Time: 1540-1603 PT Time Calculation (min) (ACUTE ONLY): 23 min  Charges:  $Therapeutic Exercise: 8-22 mins $Neuromuscular Re-education: 8-22 mins                      Arby Barrette, PT Acute Rehabilitation Services  Pager [phone] Office [phone]    Rexanne Mano 12/29/2020, 4:19 PM

## 2020-12-29 NOTE — Progress Notes (Addendum)
ANTICOAGULATION CONSULT NOTE  Pharmacy Consult:  Heparin (Apixaban on hold) Indication: atrial fibrillation  Allergies  Allergen Reactions   Naproxen Hives   No [employer] Food Allergies Other (See Comments)    Scallops - distress, nausea and vomitting    Patient Measurements: Height: 5\' 7"  (170.2 cm) Weight: 84 kg (185 lb 3 oz) IBW/kg (Calculated) : 66.1 Heparin Dosing Weight: 82 kg  Vital Signs: Temp: 98.8 F (37.1 C) (10/25 0853) Temp Source: Axillary (10/25 0853) BP: 135/83 (10/25 0853) Pulse Rate: 82 (10/25 0853)  Labs: Recent Labs    12/27/20 0101 12/28/20 0137 12/29/20 0407  HGB 8.2* 7.8* 7.5*  HCT 26.4* 24.8* 23.8*  PLT 258 250 243  APTT 77* 65* 72*  HEPARINUNFRC 0.55 0.38 0.30  CREATININE 0.86 0.94 0.82     Estimated Creatinine Clearance: 77 mL/min (by C-G formula based on SCr of 0.82 mg/dL).   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  Patient may need a PEG tube, so Pharmacy consulted to  transition patient to IV heparin to prepare for the procedure.  CBC stable; no bleeding reported.  Noted patient has a recent history of SDH requiring bur hole evacuation in May and a CVA in June.  Heparin level is therapeutic at 0.3, aPTT came back therapeutic at 72 - now correlating so will monitor HL. Hgb 7.5, plt 243. No s/sx of bleeding or infusion issues.   Goal of Therapy:  Heparin level 0.3-0.5 units/ml aPTT 66-85 seconds Monitor platelets by anticoagulation protocol: Yes   Plan:  Increase heparin to 1250 units/hr  Daily heparin level and aPTT  Antonietta Jewel, PharmD, [place] Pharmacist  Phone: [phone] 12/29/2020 9:27 AM  Please check AMION for all [place] phone numbers After 10:00 PM, call [place] [phone]

## 2020-12-29 NOTE — Progress Notes (Signed)
PROGRESS NOTE        PATIENT DETAILS Name: [name] [name] Age: 80 y.o. Sex: male Date of Birth: March 15, 1940 Admit Date: 12/14/2020 Admitting Physician Georgette Shell, MD PNT:IRWER, Arvid Right, MD  Brief Narrative: Patient is a 80 y.o. 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  Hospital course complicated by prolonged encephalopathy. Thankully starting on 10/19-encephalopathy has started to improve.  See below for further details.   Pertinent Labs/Radiology: Hb: 7.9 Na: 136 K: 3.9  creatinine: 0.74  AST/ALT: 69/127  Microbiology: 10/10>>Blood culture: Enterococcus Enterococcus faecalis 10/10>> urine culture: Staff aureus 10/11>> blood culture: 1/2-Enterococcus faecalis 10/13>> blood culture: 1/2-Enterococcus faecalis 10/15>> blood culture: Staff epidermidis. 10/17>> blood culture: No growth  Radiology/Echo/EEG: 10/10>> CT head: Small right subdural hematoma 10/11>> TTE: EF-65-70%, no obvious vegetation. 10/13-10/14>> LTM EEG: No seizures. 10/14>> TEE: Prosthetic mitral valve vegetation 10/14>> MRI brain: Acute infarct left cerebellar vermis, small subacute hematoma in the right frontal lobe.   Subjective:  Patient in bed, appears comfortable, denies any headache, no fever, no chest pain or pressure, no shortness of breath , no abdominal pain. No new focal weakness.   Objective: Vitals: Blood pressure 135/83, pulse 82, temperature 98.8 F (37.1 C), temperature  source Axillary, resp. rate 20, height 5\' 7"  (1.702 m), weight 84 kg, SpO2 95 %.   Exam:  Awake, weak, L sided weakness 0/5, Cor track in place [place].AT,PERRAL Supple Neck, No JVD,   Symmetrical Chest wall movement, Good air movement bilaterally, CTAB RRR,No Gallops, Rubs or new Murmurs,  +ve B.Sounds, Abd Soft, No tenderness,   No Cyanosis, Clubbing or edema,      Assessment/Plan:  Acute metabolic encephalopathy: Due to hypernatremia/enterococcal bacteremia-had prolonged encephalopathy-but since 10/19-gradually improving.  Gradual but consistent improvement in mental status.  Remains on amantadine 200 mg twice daily for arousal.  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  Repeat blood culture on 10/17 negative so far.  Now on Rocephin/ampicillin-with stop date of 01/28/2021 >> then amoxicillin 500 mg twice daily indefinitely.  Plan to place PICC line sometime next week prior to discharge.  Right wrist tenderness: Unclear etiology-no evidence of erythema swelling on exam.  Uric acid levels unremarkable.  X-ray suggestive of arthritis-better with supportive care and application of Voltaren gel.  Watch and follow.   Transaminitis: Suspect this is multifactorial from statin/Rocephin/underlying bacteremia.  Abdomen is soft.  Statin discontinued on 10/19-LFTs have started to downtrend.  Continue to hold statin-and follow trend.    Normocytic anemia: Due to acute illness-no evidence of blood loss-watch/follow CBC closely.  PAF: Continue Tikosyn (pharmacy dosing)-initially on Eliquis-has been switched to IV heparin-if he remains clinically stable Eliquis in a few days.  Acute left cerebellar vermis CVA: Felt to be a septic emboli in the setting of mitral valve endocarditis.     History of seizure disorder: Initially on Keppra-given lethargy/encephalopathy-switched to Vimpat on 10/16.  LTM EEG negative for seizures.    Recent SDH May 2022: This appears stable on recent imaging.  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  Continues to have significant left-sided hemiparesis.  On therapeutic anticoagulation with IV heparin-with plans to switch back to Eliquis prior to discharge.  History of rheumatic heart disease-s/p remote AVR/MVR (bioprosthetic valve)  Severe failure to thrive syndrome/frailty/poor oral intake/goals of care: Encephalopathy/failure to thrive syndrome have markedly improved-he is now starting to consume more oral intake.  Core track has been clamped off since 12/28/2020 dietitian following calorie count stable with oral intake.    Goals of care: Long discussion with spouse/2 sons on 10/20 by this MD, goals of care as follows. 1.DNR  2.Family fully well aware that it is unlikely that after this acute illness-patient will achieve his prior level of functioning/mental acuity.  However, if he is able to interact and enjoy some quality time with family/grandkids (even in a wheelchair)-they feel that this is something that the patient would want. 3.Calorie count over the weekend-if mental/clinical status continues to improve-okay to proceed with PEG tube placement if felt to be needed. 4.Apart from PEG tube placement-no further aggressive care.  Family aware that patient is not a candidate for redo valve replacement surgery etc.   5.Family wants to give it several weeks-to allow for further mental status/functional improvement.Family okay with being discharged to SNF with peg tube (if needed)/PICC line-and palliative care follow-up.   6.If while at SNF-if felt to have no significant improvement in quality of life or if he suffers a significant  decompensation-family willing to consider transitioning to comfort measures then.  7.They would like treatment with antibiotics/IV fluids/hospitalization for mild to moderate infection/electrolyte disorders. 8. Family aware that if in the interim (while hospitalized)-patient has significant decline/acute decompensation-organ damage/ICU level of care-family would be okay to transitioning to comfort measures in that event.    Nutrition Status: Nutrition Problem: Severe Malnutrition Etiology: chronic illness (CHF, Stroke) Signs/Symptoms: percent weight loss, moderate fat depletion, severe muscle depletion Percent weight loss: 12 % Interventions: Tube feeding, Prostat, MVI    Procedures: None Consults: Cardiology, neurology, infectious disease DVT Prophylaxis: Eliquis Code Status:DNR Family Communication: [name] at bedside 12/27/20, 12/28/20  Time spent: 35 minutes-Greater than 50% of this time was spent in counseling, explanation of diagnosis, planning of further management, and coordination of care.  Diet: Diet Order             DIET DYS 3 Room service appropriate? Yes; Fluid consistency: Nectar Thick  Diet effective now                      Disposition Plan: Status is: Inpatient  Remains inpatient appropriate because:Inpatient level of care appropriate due to severity of illness  Dispo: The patient is from: SNF              Anticipated d/c is to: SNF              Patient currently is not medically stable to d/c.   Difficult to place patient No    Barriers to Discharge: Persistent encephalopathy-persistent bacteremia with Enterococcus-on IV antibiotics-NG tube feedings ongoing due to poor oral intake.  Antimicrobial agents: Anti-infectives (From admission, onward)    Start     Dose/Rate Route Frequency Ordered Stop   12/25/20 1600  ampicillin (OMNIPEN) 2 g in sodium chloride 0.9 % 100  mL IVPB        2 g 300 mL/hr over 20 Minutes Intravenous Every 4 hours  12/25/20 1227 01/28/21 2359   12/18/20 1500  cefTRIAXone (ROCEPHIN) 2 g in sodium chloride 0.9 % 100 mL IVPB        2 g 200 mL/hr over 30 Minutes Intravenous Every 12 hours 12/18/20 1344 01/28/21 2359   12/15/20 1700  cefTRIAXone (ROCEPHIN) 1 g in sodium chloride 0.9 % 100 mL IVPB  Status:  Discontinued        1 g 200 mL/hr over 30 Minutes Intravenous Every 24 hours 12/14/20 1648 12/15/20 0624   12/15/20 0630  ampicillin (OMNIPEN) 2 g in sodium chloride 0.9 % 100 mL IVPB  Status:  Discontinued        2 g 300 mL/hr over 20 Minutes Intravenous Every 6 hours 12/15/20 0624 12/25/20 1227   12/15/20 0630  gentamicin (GARAMYCIN) IVPB 80 mg        80 mg 100 mL/hr over 30 Minutes Intravenous  Once 12/15/20 0628 12/15/20 0732   12/14/20 1645  cefTRIAXone (ROCEPHIN) 1 g in sodium chloride 0.9 % 100 mL IVPB        1 g 200 mL/hr over 30 Minutes Intravenous  Once 12/14/20 1642 12/14/20 1830        MEDICATIONS: Scheduled Meds:  amantadine  200 mg Per Tube BID   diclofenac Sodium  4 g Topical QID   dofetilide  375 mcg Oral BID   doxazosin  1 mg Per Tube Daily   finasteride  5 mg Oral Daily   insulin aspart  0-9 Units Subcutaneous TID WC   lacosamide  50 mg Per Tube BID   magnesium gluconate  500 mg Per Tube Daily   mouth rinse  15 mL Mouth Rinse BID   multivitamin with minerals  1 tablet Per Tube Daily   Continuous Infusions:  ampicillin (OMNIPEN) IV 2 g (12/29/20 1132)   cefTRIAXone (ROCEPHIN)  IV 2 g (12/29/20 0819)   heparin 1,250 Units/hr (12/29/20 0243)   PRN Meds:.REM   I have personally reviewed following labs and imaging studies  LABORATORY DATA:  Recent Labs  Lab 12/23/20 0234 12/26/20 0300 12/27/20 0101 12/28/20 0137 12/29/20 0407  WBC 9.4 10.8* 11.7* 11.9* 12.4*  HGB 8.4* 7.9* 8.2* 7.8* 7.5*  HCT 25.9* 24.7* 26.4* 24.8* 23.8*  PLT 201 235 258 250 243  MCV 87.2 88.8 89.8 90.2 89.1  MCH 28.3 28.4 27.9 28.4 28.1  MCHC 32.4 32.0 31.1 31.5 31.5  RDW 15.9* 16.1* 16.4*  16.8* 17.2*    Recent Labs  Lab 12/25/20 0058 12/26/20 0300 12/27/20 0101 12/28/20 0137 12/29/20 0407  NA 135 136 136 133* 135  K 4.1 3.9 4.1 4.0 3.8  CL 103 103 104 103 104  CO2 25 25 24 23 24   GLUCOSE 161* 147* 178* 157* 103*  BUN 21 17 18 19 17   CREATININE 0.75 0.74 0.86 0.94 0.82  CALCIUM 7.7* 7.9* 7.8* 7.7* 7.7*  AST 95* 69* 69* 45* 41  ALT 151* 127* 124* 96* 80*  ALKPHOS 90 85 93 87 87  BILITOT 0.5 0.3 0.3 0.4 0.6  ALBUMIN 1.6* 1.6* 1.7* 1.7* 1.7*  MG 2.0 1.9 2.1 1.9 2.0  PROCALCITON  --   --   --   --  0.13          Urine analysis:    Component Value Date/Time   COLORURINE YELLOW 12/14/2020 1600   APPEARANCEUR CLOUDY (A) 12/14/2020 1600  LABSPEC 1.020 12/14/2020 1600   PHURINE 6.0 12/14/2020 1600   GLUCOSEU NEGATIVE 12/14/2020 1600   HGBUR LARGE (A) 12/14/2020 1600   BILIRUBINUR NEGATIVE 12/14/2020 1600   BILIRUBINUR negative 11/22/2013 0848   KETONESUR NEGATIVE 12/14/2020 1600   PROTEINUR NEGATIVE 12/14/2020 1600   UROBILINOGEN 0.2 11/22/2013 0848   UROBILINOGEN 1.0 08/05/2013 2207   NITRITE POSITIVE (A) 12/14/2020 1600   LEUKOCYTESUR LARGE (A) 12/14/2020 1600    Sepsis Labs: Lactic Acid, Venous    Component Value Date/Time   LATICACIDVEN 1.8 12/14/2020 2056     RADIOLOGY STUDIES/RESULTS: DG Chest Port 1 View  Result Date: 12/29/2020 CLINICAL DATA:  Shortness of breath EXAM: PORTABLE CHEST 1 VIEW COMPARISON:  12/28/2020 FINDINGS: Cardiomegaly status post median sternotomy with aortic and mitral valvular prosthesis. Mild, diffuse bilateral interstitial pulmonary opacity, similar compared to prior examination. No new airspace opacity. Partially imaged enteric feeding tube. IMPRESSION: Cardiomegaly. Mild, diffuse bilateral interstitial pulmonary opacity, similar compared to prior examination, consistent with edema or infection. No new airspace opacity. Electronically Signed   By: Delanna Ahmadi M.D.   On: 12/29/2020 11:40   DG Chest Port 1  View  Result Date: 12/28/2020 CLINICAL DATA:  Shortness of breath, CHF EXAM: PORTABLE CHEST 1 VIEW COMPARISON:  Portable exam 0000 hours compared to 12/14/2020 FINDINGS: Feeding tube traverses esophagus into stomach. Enlargement of cardiac silhouette post median sternotomy, MVR, and AVR. Atherosclerotic calcification aorta. Minimal bibasilar atelectasis. Lungs otherwise clear. No infiltrate, pleural effusion, or pneumothorax. No acute osseous findings. IMPRESSION: Minimal bibasilar atelectasis. Enlargement of cardiac silhouette post MVR and AVR. Electronically Signed   By: Lavonia Dana M.D.   On: 12/28/2020 08:36     LOS: 14 days   Signature  Lala Lund M.D on 12/29/2020 at 12:32 PM   -  To page go to [email or web address]

## 2020-12-29 NOTE — Plan of Care (Signed)
  Problem: Clinical Measurements: Goal: Cardiovascular complication will be avoided Outcome: Progressing   Problem: Nutrition: Goal: Adequate nutrition will be maintained Outcome: Progressing   Problem: Coping: Goal: Level of anxiety will decrease Outcome: Progressing   Problem: Pain Managment: Goal: General experience of comfort will improve Outcome: Progressing   Problem: Safety: Goal: Ability to remain free from injury will improve Outcome: Progressing   Problem: Skin Integrity: Goal: Risk for impaired skin integrity will decrease Outcome: Progressing   Problem: Self-Care: Goal: Ability to communicate needs accurately will improve Outcome: Progressing   Problem: Nutrition: Goal: Risk of aspiration will decrease Outcome: Progressing   Problem: Nutrition: Goal: Dietary intake will improve Outcome: Progressing

## 2020-12-29 NOTE — Progress Notes (Signed)
Speech Language Pathology Treatment: Dysphagia  Patient Details Name: DEMARQUIS OSLEY MRN: [id number] DOB: 1940-06-26 Today's Date: 12/29/2020 Time: 8127-5170 SLP Time Calculation (min) (ACUTE ONLY): 24 min  Assessment / Plan / Recommendation Clinical Impression  Followed up for diet tolerance since upgrade to mechanical soft texture. Per chart review overall intake has increased with DC of cortrak tube feedings and DC of calorie count. Pts spouse present for continued dysphagia education. Provided diligent oral care, pt with mild oral residuals at baseline from breakfast meal. Spouse reports pt had some coughing noted with dry textures (eggs this am). Discussed adding moisture to consistencies to assist tolerance (gravies) spouse eager to try. Assessed with mechanical soft, puree, and nectar thick liquid textures. Pt required consistent cueing from SLP and spouse to maintain adequate alertness. No overt s/sx of aspiration noted with any POs. Continue current diet, SLP will continue to follow.   HPI HPI: 80 y.o. 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  TEE completed on 12/18/20. Pt has a hx of cognitive deficits and dysphagia, with most recent MBS on 09/02/20 and most recent diet recommendations for Dysphagia 1 solids and honey-thick liquids.  Per family, pt has since advanced to mechanical soft solid and NTL.  Cortrak placed 12/18/20.      SLP Plan  Continue with current plan of care      Recommendations for follow up therapy are one component of a multi-disciplinary discharge planning process, led by the attending physician.  Recommendations may be updated based on patient status,  additional functional criteria and insurance authorization.    Recommendations  Diet recommendations: Dysphagia 3 (mechanical soft);Nectar-thick liquid Liquids provided via: Cup;Teaspoon Medication Administration: Crushed with puree Supervision: Trained caregiver to feed patient Compensations: Slow rate;Small sips/bites Postural Changes and/or Swallow Maneuvers: Seated upright 90 degrees                Oral Care Recommendations: Oral care BID;Staff/trained caregiver to provide oral care Follow up Recommendations: 24 hour supervision/assistance;Skilled Nursing facility SLP Visit Diagnosis: Dysphagia, unspecified (R13.10) Plan: Continue with current plan of care       [place], CCC-SLP Acute Rehabilitation Services    12/29/2020, 11:28 AM

## 2020-12-30 ENCOUNTER — Inpatient Hospital Stay (HOSPITAL_COMMUNITY): Payer: Medicare Other

## 2020-12-30 DIAGNOSIS — R400 Somnolence: Secondary | ICD-10-CM | POA: Diagnosis not present

## 2020-12-30 LAB — COMPREHENSIVE METABOLIC PANEL
ALT: 89 U/L — ABNORMAL HIGH (ref 0–44)
AST: 47 U/L — ABNORMAL HIGH (ref 15–41)
Albumin: 1.9 g/dL — ABNORMAL LOW (ref 3.5–5.0)
Alkaline Phosphatase: 109 U/L (ref 38–126)
Anion gap: 10 (ref 5–15)
BUN: 17 mg/dL (ref 8–23)
CO2: 22 mmol/L (ref 22–32)
Calcium: 8.3 mg/dL — ABNORMAL LOW (ref 8.9–10.3)
Chloride: 103 mmol/L (ref 98–111)
Creatinine, Ser: 0.86 mg/dL (ref 0.61–1.24)
GFR, Estimated: >60 mL/min (ref >60)
Glucose, Bld: 126 mg/dL — ABNORMAL HIGH (ref 70–99)
Potassium: 3.9 mmol/L (ref 3.5–5.1)
Sodium: 135 mmol/L (ref 135–145)
Total Bilirubin: 0.5 mg/dL (ref 0.3–1.2)
Total Protein: 6.5 g/dL (ref 6.5–8.1)

## 2020-12-30 LAB — CBC
HCT: 26.8 % — ABNORMAL LOW (ref 39.0–52.0)
Hemoglobin: 8.5 g/dL — ABNORMAL LOW (ref 13.0–17.0)
MCH: 28.6 pg (ref 26.0–34.0)
MCHC: 31.7 g/dL (ref 30.0–36.0)
MCV: 90.2 fL (ref 80.0–100.0)
Platelets: 268 10*3/uL (ref 150–400)
RBC: 2.97 MIL/uL — ABNORMAL LOW (ref 4.22–5.81)
RDW: 17.8 % — ABNORMAL HIGH (ref 11.5–15.5)
WBC: 14.3 10*3/uL — ABNORMAL HIGH (ref 4.0–10.5)
nRBC: 0.3 % — ABNORMAL HIGH (ref 0.0–0.2)

## 2020-12-30 LAB — GLUCOSE, CAPILLARY
Glucose-Capillary: 104 mg/dL — ABNORMAL HIGH (ref 70–99)
Glucose-Capillary: 109 mg/dL — ABNORMAL HIGH (ref 70–99)
Glucose-Capillary: 155 mg/dL — ABNORMAL HIGH (ref 70–99)
Glucose-Capillary: 159 mg/dL — ABNORMAL HIGH (ref 70–99)

## 2020-12-30 LAB — BRAIN NATRIURETIC PEPTIDE: B Natriuretic Peptide: 225.4 pg/mL — ABNORMAL HIGH (ref 0.0–100.0)

## 2020-12-30 LAB — PROCALCITONIN: Procalcitonin: <0.1 ng/mL

## 2020-12-30 LAB — MAGNESIUM: Magnesium: 1.9 mg/dL (ref 1.7–2.4)

## 2020-12-30 LAB — HEPARIN LEVEL (UNFRACTIONATED): Heparin Unfractionated: 0.3 IU/mL (ref 0.30–0.70)

## 2020-12-30 MED ORDER — FLUOCINONIDE 0.05 % EX CREA
TOPICAL_CREAM | Freq: Two times a day (BID) | CUTANEOUS | Status: DC
  Filled 2020-12-30 (×2): qty 15

## 2020-12-30 MED ORDER — ONDANSETRON HCL 4 MG/2ML IJ SOLN
4.0000 mg | Freq: Four times a day (QID) | INTRAMUSCULAR | Status: DC | PRN
  Administered 2020-12-30: 4 mg via INTRAVENOUS
  Filled 2020-12-30: qty 2

## 2020-12-30 MED ORDER — MAGNESIUM GLUCONATE 500 MG PO TABS
500.0000 mg | ORAL_TABLET | Freq: Every day | ORAL | Status: DC
  Administered 2020-12-30 – 2021-01-05 (×7): 500 mg via ORAL
  Filled 2020-12-30 (×7): qty 1

## 2020-12-30 MED ORDER — LACOSAMIDE 50 MG PO TABS
50.0000 mg | ORAL_TABLET | Freq: Two times a day (BID) | ORAL | Status: DC
  Administered 2020-12-30 – 2021-01-05 (×13): 50 mg via ORAL
  Filled 2020-12-30 (×13): qty 1

## 2020-12-30 MED ORDER — POTASSIUM CHLORIDE CRYS ER 20 MEQ PO TBCR
20.0000 meq | EXTENDED_RELEASE_TABLET | Freq: Once | ORAL | Status: AC
  Administered 2020-12-30: 20 meq via ORAL
  Filled 2020-12-30: qty 1

## 2020-12-30 MED ORDER — ADULT MULTIVITAMIN W/MINERALS CH
1.0000 | ORAL_TABLET | Freq: Every day | ORAL | Status: DC
  Administered 2020-12-30 – 2021-01-05 (×7): 1 via ORAL
  Filled 2020-12-30 (×7): qty 1

## 2020-12-30 MED ORDER — BISACODYL 10 MG RE SUPP
10.0000 mg | Freq: Every day | RECTAL | Status: DC
  Administered 2020-12-30: 10 mg via RECTAL
  Filled 2020-12-30 (×2): qty 1

## 2020-12-30 MED ORDER — LACTULOSE 10 GM/15ML PO SOLN
30.0000 g | Freq: Once | ORAL | Status: AC
  Administered 2020-12-30: 30 g via ORAL
  Filled 2020-12-30: qty 45

## 2020-12-30 MED ORDER — BISACODYL 10 MG RE SUPP
10.0000 mg | Freq: Once | RECTAL | Status: AC
  Administered 2020-12-30: 10 mg via RECTAL
  Filled 2020-12-30: qty 1

## 2020-12-30 MED ORDER — DOCUSATE SODIUM 100 MG PO CAPS
200.0000 mg | ORAL_CAPSULE | Freq: Two times a day (BID) | ORAL | Status: DC
  Administered 2020-12-30 (×2): 200 mg via ORAL
  Filled 2020-12-30 (×2): qty 2

## 2020-12-30 MED ORDER — KCL IN DEXTROSE-NACL 40-5-0.9 MEQ/L-%-% IV SOLN
INTRAVENOUS | Status: DC
  Filled 2020-12-30 (×2): qty 1000

## 2020-12-30 MED ORDER — MAGNESIUM HYDROXIDE 400 MG/5ML PO SUSP
30.0000 mL | Freq: Two times a day (BID) | ORAL | Status: AC
  Administered 2020-12-30 (×2): 30 mL via ORAL
  Filled 2020-12-30 (×2): qty 30

## 2020-12-30 MED ORDER — AMANTADINE HCL 50 MG/5ML PO SOLN
200.0000 mg | Freq: Two times a day (BID) | ORAL | Status: DC
  Administered 2020-12-30 – 2021-01-04 (×9): 200 mg via ORAL
  Filled 2020-12-30 (×13): qty 20

## 2020-12-30 MED ORDER — DOXAZOSIN MESYLATE 1 MG PO TABS
1.0000 mg | ORAL_TABLET | Freq: Every day | ORAL | Status: DC
  Administered 2020-12-30 – 2021-01-05 (×7): 1 mg via ORAL
  Filled 2020-12-30 (×8): qty 1

## 2020-12-30 NOTE — Progress Notes (Signed)
PROGRESS NOTE        PATIENT DETAILS Name: [name] [name] Age: 80 y.o. Sex: male Date of Birth: [birth year] Admit Date: 12/14/2020 Admitting Physician Georgette Shell, MD HYI:FOYDX, Arvid Right, MD  Brief Narrative: Patient is a 80 y.o. 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  Hospital course complicated by prolonged encephalopathy. Thankully starting on 10/19-encephalopathy has started to improve.  See below for further details.   Pertinent Labs/Radiology: Hb: 7.9 Na: 136 K: 3.9  creatinine: 0.74  AST/ALT: 69/127  Microbiology: 10/10>>Blood culture: Enterococcus Enterococcus faecalis 10/10>> urine culture: Staff aureus 10/11>> blood culture: 1/2-Enterococcus faecalis 10/13>> blood culture: 1/2-Enterococcus faecalis 10/15>> blood culture: Staff epidermidis. 10/17>> blood culture: No growth  Radiology/Echo/EEG: 10/10>> CT head: Small right subdural hematoma 10/11>> TTE: EF-65-70%, no obvious vegetation. 10/13-10/14>> LTM EEG: No seizures. 10/14>> TEE: Prosthetic mitral valve vegetation 10/14>> MRI brain: Acute infarct left cerebellar vermis, small subacute hematoma in the right frontal lobe.   Subjective:   Patient in bed, appears comfortable, denies any headache, no fever, no chest pain or pressure, no shortness of breath , no abdominal pain. No new focal weakness.   Objective: Vitals: Blood pressure 128/77, pulse 99, temperature 98.6 F (37 C), temperature  source Oral, resp. rate 20, height 5\' 7"  (1.702 m), weight 83.1 kg, SpO2 96 %.   Exam:  Awake, weak, L sided weakness 0/5, Cor track in place [place].AT,PERRAL Supple Neck, No JVD,   Symmetrical Chest wall movement, Good air movement bilaterally, CTAB RRR,No Gallops, Rubs or new Murmurs,  +ve B.Sounds, Abd Soft, No tenderness,   No Cyanosis, Clubbing or edema,       Assessment/Plan:  Acute metabolic encephalopathy: Due to hypernatremia/enterococcal bacteremia-had prolonged encephalopathy-but since 10/19-gradually improving.  Gradual but consistent improvement in mental status.  Remains on amantadine 200 mg twice daily for arousal.  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  Repeat blood culture on 10/17 negative so far.  Now on Rocephin/ampicillin-with stop date of 01/28/2021 >> then amoxicillin 500 mg twice daily indefinitely.  Plan to place PICC line sometime next week prior to discharge.  Right wrist tenderness: Unclear etiology-no evidence of erythema swelling on exam.  Uric acid levels unremarkable.  X-ray suggestive of arthritis-better with supportive care and application of Voltaren gel.  Watch and follow.   Transaminitis: Suspect this is multifactorial from statin/Rocephin/underlying bacteremia.  Abdomen is soft.  Statin discontinued on 10/19-LFTs have started to downtrend.  Continue to hold statin-and follow trend.    Normocytic anemia: Due to acute illness-no evidence of blood loss-watch/follow CBC closely.  PAF: Continue Tikosyn (pharmacy dosing)-initially on Eliquis-has been switched to IV heparin-if he remains clinically stable Eliquis in a few days.  Acute left cerebellar vermis CVA: Felt to be a septic emboli in the setting of mitral valve endocarditis.     History of seizure disorder: Initially on Keppra-given lethargy/encephalopathy-switched to Vimpat on  10/16.  LTM EEG negative for seizures.    Recent SDH May 2022: This appears stable on recent imaging.  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  Continues to have significant left-sided hemiparesis.  On therapeutic anticoagulation with IV heparin-with plans to switch back to Eliquis prior to discharge.  History of rheumatic heart disease-s/p remote AVR/MVR (bioprosthetic valve)  Severe failure to thrive syndrome/frailty/poor oral intake/goals of care: Encephalopathy/failure to thrive syndrome have markedly improved-he is now starting to consume more oral intake.  Core track has been clamped off since 12/28/2020 dietitian following calorie count stable with oral intake.  Constipation - placed on B.regimen.     Goals of care: Long discussion with spouse/2 sons on 10/20 by this MD, goals of care as follows. 1.DNR  2.Family fully well aware that it is unlikely that after this acute illness-patient will achieve his prior level of functioning/mental acuity.  However, if he is able to interact and enjoy some quality time with family/grandkids (even in a wheelchair)-they feel that this is something that the patient would want. 3.Calorie count over the weekend-if mental/clinical status continues to improve-okay to proceed with PEG tube placement if felt to be needed. 4.Apart from PEG tube placement-no further aggressive care.  Family aware that patient is not a candidate for redo valve replacement surgery etc.   5.Family wants to give it several weeks-to allow for further mental status/functional improvement.Family okay with being discharged to SNF with peg tube (if needed)/PICC line-and palliative care follow-up.   6.If while at SNF-if felt to have no significant improvement in quality  of life or if he suffers a significant decompensation-family willing to consider transitioning to comfort measures then.  7.They would like treatment with antibiotics/IV fluids/hospitalization for mild to moderate infection/electrolyte disorders. 8. Family aware that if in the interim (while hospitalized)-patient has significant decline/acute decompensation-organ damage/ICU level of care-family would be okay to transitioning to comfort measures in that event.    Nutrition Status: Nutrition Problem: Severe Malnutrition Etiology: chronic illness (CHF, Stroke) Signs/Symptoms: percent weight loss, moderate fat depletion, severe muscle depletion Percent weight loss: 12 % Interventions: Tube feeding, Prostat, MVI    Procedures: None Consults: Cardiology, neurology, infectious disease DVT Prophylaxis: Eliquis >> Hep gtt Code Status:DNR Family Communication: [name] at bedside 12/27/20, 12/28/20, 12/29/20, 12/30/20  Time spent: 35 minutes-Greater than 50% of this time was spent in counseling, explanation of diagnosis, planning of further management, and coordination of care.  Diet: Diet Order             DIET DYS 3 Room service appropriate? Yes; Fluid consistency: Nectar Thick  Diet effective now                      Disposition Plan: Status is: Inpatient  Remains inpatient appropriate because:Inpatient level of care appropriate due to severity of illness  Dispo: The patient is from: SNF              Anticipated d/c is to: SNF              Patient currently is not medically stable to d/c.   Difficult to place patient No    Barriers to Discharge: Persistent encephalopathy-persistent bacteremia with Enterococcus-on IV antibiotics-NG tube feedings ongoing due to poor oral intake.  Antimicrobial agents: Anti-infectives (From admission, onward)    Start     Dose/Rate Route Frequency Ordered Stop  12/25/20 1600  ampicillin (OMNIPEN) 2 g in sodium chloride 0.9 % 100 mL  IVPB        2 g 300 mL/hr over 20 Minutes Intravenous Every 4 hours 12/25/20 1227 01/28/21 2359   12/18/20 1500  cefTRIAXone (ROCEPHIN) 2 g in sodium chloride 0.9 % 100 mL IVPB        2 g 200 mL/hr over 30 Minutes Intravenous Every 12 hours 12/18/20 1344 01/28/21 2359   12/15/20 1700  cefTRIAXone (ROCEPHIN) 1 g in sodium chloride 0.9 % 100 mL IVPB  Status:  Discontinued        1 g 200 mL/hr over 30 Minutes Intravenous Every 24 hours 12/14/20 1648 12/15/20 0624   12/15/20 0630  ampicillin (OMNIPEN) 2 g in sodium chloride 0.9 % 100 mL IVPB  Status:  Discontinued        2 g 300 mL/hr over 20 Minutes Intravenous Every 6 hours 12/15/20 0624 12/25/20 1227   12/15/20 0630  gentamicin (GARAMYCIN) IVPB 80 mg        80 mg 100 mL/hr over 30 Minutes Intravenous  Once 12/15/20 0628 12/15/20 0732   12/14/20 1645  cefTRIAXone (ROCEPHIN) 1 g in sodium chloride 0.9 % 100 mL IVPB        1 g 200 mL/hr over 30 Minutes Intravenous  Once 12/14/20 1642 12/14/20 1830        MEDICATIONS: Scheduled Meds:  amantadine  200 mg Oral BID   bisacodyl  10 mg Rectal Daily   diclofenac Sodium  4 g Topical QID   docusate sodium  200 mg Oral BID   dofetilide  375 mcg Oral BID   doxazosin  1 mg Oral Daily   finasteride  5 mg Oral Daily   insulin aspart  0-9 Units Subcutaneous TID WC   lacosamide  50 mg Oral BID   lactulose  30 g Oral Once   magnesium gluconate  500 mg Oral Daily   magnesium hydroxide  30 mL Oral BID   mouth rinse  15 mL Mouth Rinse BID   multivitamin with minerals  1 tablet Oral Daily   Continuous Infusions:  ampicillin (OMNIPEN) IV 2 g (12/30/20 0749)   cefTRIAXone (ROCEPHIN)  IV 2 g (12/29/20 2157)   heparin 1,250 Units/hr (12/29/20 2205)   PRN Meds:.REM   I have personally reviewed following labs and imaging studies  LABORATORY DATA:  Recent Labs  Lab 12/26/20 0300 12/27/20 0101 12/28/20 0137 12/29/20 0407 12/30/20 0211  WBC 10.8* 11.7* 11.9* 12.4* 14.3*  HGB 7.9* 8.2* 7.8*  7.5* 8.5*  HCT 24.7* 26.4* 24.8* 23.8* 26.8*  PLT 235 258 250 243 268  MCV 88.8 89.8 90.2 89.1 90.2  MCH 28.4 27.9 28.4 28.1 28.6  MCHC 32.0 31.1 31.5 31.5 31.7  RDW 16.1* 16.4* 16.8* 17.2* 17.8*    Recent Labs  Lab 12/26/20 0300 12/27/20 0101 12/28/20 0137 12/29/20 0407 12/30/20 0211  NA 136 136 133* 135 135  K 3.9 4.1 4.0 3.8 3.9  CL 103 104 103 104 103  CO2 25 24 23 24 22   GLUCOSE 147* 178* 157* 103* 126*  BUN 17 18 19 17 17   CREATININE 0.74 0.86 0.94 0.82 0.86  CALCIUM 7.9* 7.8* 7.7* 7.7* 8.3*  AST 69* 69* 45* 41 47*  ALT 127* 124* 96* 80* 89*  ALKPHOS 85 93 87 87 109  BILITOT 0.3 0.3 0.4 0.6 0.5  ALBUMIN 1.6* 1.7* 1.7* 1.7* 1.9*  MG 1.9 2.1 1.9 2.0 1.9  PROCALCITON  --   --   --  0.13 <0.10  BNP  --   --   --  207.1* 225.4*          Urine analysis:    Component Value Date/Time   COLORURINE YELLOW 12/14/2020 1600   APPEARANCEUR CLOUDY (A) 12/14/2020 1600   LABSPEC 1.020 12/14/2020 1600   PHURINE 6.0 12/14/2020 1600   GLUCOSEU NEGATIVE 12/14/2020 1600   HGBUR LARGE (A) 12/14/2020 1600   BILIRUBINUR NEGATIVE 12/14/2020 1600   BILIRUBINUR negative 11/22/2013 0848   KETONESUR NEGATIVE 12/14/2020 1600   PROTEINUR NEGATIVE 12/14/2020 1600   UROBILINOGEN 0.2 11/22/2013 0848   UROBILINOGEN 1.0 08/05/2013 2207   NITRITE POSITIVE (A) 12/14/2020 1600   LEUKOCYTESUR LARGE (A) 12/14/2020 1600    Sepsis Labs: Lactic Acid, Venous    Component Value Date/Time   LATICACIDVEN 1.8 12/14/2020 2056     RADIOLOGY STUDIES/RESULTS: DG Chest Port 1 View  Result Date: 12/30/2020 CLINICAL DATA:  Stroke, AFib, sepsis EXAM: PORTABLE CHEST 1 VIEW COMPARISON:  Radiograph 12/29/2020 FINDINGS: Feeding tube passes below diaphragm, tip excluded by collimation. Prior median sternotomy and valve replacement. Unchanged cardiomegaly. Diffuse interstitial opacities, similar to prior exam. No large pleural effusion or visible pneumothorax. Bones are unchanged. IMPRESSION: Unchanged  cardiomegaly. Diffuse interstitial opacities consistent with pulmonary edema. Electronically Signed   By: Maurine Simmering M.D.   On: 12/30/2020 08:45   DG Chest Port 1 View  Result Date: 12/29/2020 CLINICAL DATA:  Shortness of breath EXAM: PORTABLE CHEST 1 VIEW COMPARISON:  12/28/2020 FINDINGS: Cardiomegaly status post median sternotomy with aortic and mitral valvular prosthesis. Mild, diffuse bilateral interstitial pulmonary opacity, similar compared to prior examination. No new airspace opacity. Partially imaged enteric feeding tube. IMPRESSION: Cardiomegaly. Mild, diffuse bilateral interstitial pulmonary opacity, similar compared to prior examination, consistent with edema or infection. No new airspace opacity. Electronically Signed   By: Delanna Ahmadi M.D.   On: 12/29/2020 11:40   DG Abd Portable 1V  Result Date: 12/30/2020 CLINICAL DATA:  Nausea EXAM: PORTABLE ABDOMEN - 1 VIEW COMPARISON:  04/20/2020 FINDINGS: Feeding tube tip overlies the distal stomach. There is diffuse gaseous distension of the colon and moderate stool burden in the ascending colon and rectum. No clear small bowel dilation. Prior right total hip arthroplasty. Lumbar spine degenerative changes. No acute osseous abnormality. IMPRESSION: Diffuse gaseous distension of the colon with moderate stool burden in the ascending colon and rectum. Feeding tube tip overlies the distal stomach. Electronically Signed   By: Maurine Simmering M.D.   On: 12/30/2020 08:42     LOS: 15 days   Signature  Lala Lund M.D on 12/30/2020 at 9:15 AM   -  To page go to [email or web address]

## 2020-12-30 NOTE — Progress Notes (Signed)
ANTICOAGULATION CONSULT NOTE  Pharmacy Consult:  Heparin (Apixaban on hold) Indication: atrial fibrillation  Allergies  Allergen Reactions   Naproxen Hives   No [employer] Food Allergies Other (See Comments)    Scallops - distress, nausea and vomitting    Patient Measurements: Height: 5\' 7"  (170.2 cm) Weight: 83.1 kg (183 lb 3.2 oz) IBW/kg (Calculated) : 66.1 Heparin Dosing Weight: 82 kg  Vital Signs: Temp: 98.6 F (37 C) (10/26 0335) Temp Source: Oral (10/26 0335) BP: 128/77 (10/26 0335) Pulse Rate: 99 (10/26 0335)  Labs: Recent Labs    12/28/20 0137 12/29/20 0407 12/30/20 0211  HGB 7.8* 7.5* 8.5*  HCT 24.8* 23.8* 26.8*  PLT 250 243 268  APTT 65* 72*  --   HEPARINUNFRC 0.38 0.30 0.30  CREATININE 0.94 0.82 0.86     Estimated Creatinine Clearance: 73 mL/min (by C-G formula based on SCr of 0.86 mg/dL).   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  Patient may need a PEG tube, so Pharmacy consulted to transition  patient to IV heparin to prepare for the procedure.  CBC stable; no bleeding reported.  Noted patient has a recent history of SDH requiring bur hole evacuation in May and a CVA in June.  Heparin level is therapeutic at 0.3, CBC stable.  Goal of Therapy:  Heparin level 0.3-0.5 units/ml aPTT 66-85 seconds Monitor platelets by anticoagulation protocol: Yes   Plan:  Continue heparin 1250 units/h Daily heparin level and CBC  Arrie Senate, PharmD, BCPS, [facility] Clinical Pharmacist Please check AMION for all [facility] Pharmacy numbers 12/30/2020

## 2020-12-30 NOTE — TOC CM/SW Note (Signed)
HF TOC CM spoke to [employer], Butch Penny. They have bed for pt at [facility] rehab. States they have contract with [facility] if Hospice is needed at facility. States they can make referral once pt comes back. Pt is wanting rehab at [facility]. Updated attending. [place], Heart Failure TOC CM [phone]

## 2020-12-31 ENCOUNTER — Inpatient Hospital Stay (HOSPITAL_COMMUNITY): Payer: Medicare Other

## 2020-12-31 DIAGNOSIS — R400 Somnolence: Secondary | ICD-10-CM | POA: Diagnosis not present

## 2020-12-31 LAB — COMPREHENSIVE METABOLIC PANEL
ALT: 80 U/L — ABNORMAL HIGH (ref 0–44)
AST: 45 U/L — ABNORMAL HIGH (ref 15–41)
Albumin: 1.8 g/dL — ABNORMAL LOW (ref 3.5–5.0)
Alkaline Phosphatase: 94 U/L (ref 38–126)
Anion gap: 6 (ref 5–15)
BUN: 14 mg/dL (ref 8–23)
CO2: 25 mmol/L (ref 22–32)
Calcium: 8.4 mg/dL — ABNORMAL LOW (ref 8.9–10.3)
Chloride: 105 mmol/L (ref 98–111)
Creatinine, Ser: 0.83 mg/dL (ref 0.61–1.24)
GFR, Estimated: >60 mL/min (ref >60)
Glucose, Bld: 149 mg/dL — ABNORMAL HIGH (ref 70–99)
Potassium: 4.2 mmol/L (ref 3.5–5.1)
Sodium: 136 mmol/L (ref 135–145)
Total Bilirubin: 0.3 mg/dL (ref 0.3–1.2)
Total Protein: 6.3 g/dL — ABNORMAL LOW (ref 6.5–8.1)

## 2020-12-31 LAB — GLUCOSE, CAPILLARY
Glucose-Capillary: 116 mg/dL — ABNORMAL HIGH (ref 70–99)
Glucose-Capillary: 108 mg/dL — ABNORMAL HIGH (ref 70–99)
Glucose-Capillary: 126 mg/dL — ABNORMAL HIGH (ref 70–99)
Glucose-Capillary: 116 mg/dL — ABNORMAL HIGH (ref 70–99)

## 2020-12-31 LAB — CBC
HCT: 25.7 % — ABNORMAL LOW (ref 39.0–52.0)
Hemoglobin: 8.2 g/dL — ABNORMAL LOW (ref 13.0–17.0)
MCH: 28.7 pg (ref 26.0–34.0)
MCHC: 31.9 g/dL (ref 30.0–36.0)
MCV: 89.9 fL (ref 80.0–100.0)
Platelets: 268 10*3/uL (ref 150–400)
RBC: 2.86 MIL/uL — ABNORMAL LOW (ref 4.22–5.81)
RDW: 17.9 % — ABNORMAL HIGH (ref 11.5–15.5)
WBC: 13 10*3/uL — ABNORMAL HIGH (ref 4.0–10.5)
nRBC: 0 % (ref 0.0–0.2)

## 2020-12-31 LAB — PROCALCITONIN: Procalcitonin: 0.1 ng/mL

## 2020-12-31 LAB — MAGNESIUM: Magnesium: 2.2 mg/dL (ref 1.7–2.4)

## 2020-12-31 LAB — HEPARIN LEVEL (UNFRACTIONATED): Heparin Unfractionated: 0.3 IU/mL (ref 0.30–0.70)

## 2020-12-31 LAB — SARS CORONAVIRUS 2 (TAT 6-24 HRS): SARS Coronavirus 2: NEGATIVE

## 2020-12-31 LAB — BRAIN NATRIURETIC PEPTIDE: B Natriuretic Peptide: 277.2 pg/mL — ABNORMAL HIGH (ref 0.0–100.0)

## 2020-12-31 MED ORDER — KCL IN DEXTROSE-NACL 40-5-0.9 MEQ/L-%-% IV SOLN
INTRAVENOUS | Status: DC
  Filled 2020-12-31 (×2): qty 1000

## 2020-12-31 MED ORDER — FUROSEMIDE 10 MG/ML IJ SOLN
20.0000 mg | Freq: Once | INTRAMUSCULAR | Status: AC
  Administered 2020-12-31: 20 mg via INTRAVENOUS
  Filled 2020-12-31: qty 2

## 2020-12-31 NOTE — Progress Notes (Signed)
Physical Therapy Treatment Patient Details Name: [name] [name] MRN: [id number] DOB: 1940-08-14 Today's Date: 12/31/2020   History of Present Illness 80 y/o male presented to ED on 10/10 for AMS and hypernatremia. TEE suggestive of bioprosthetic valve endocarditis. 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    PT Comments    Patient alert on arrival. Able to progress to dangle EOB with pt demonstrating some postural trunk control with eOB sitting x 15 minutes. Also worked on [place], PROM LLE, and trunk flexibility/ROM while seated.     Recommendations for follow up therapy are one component of a multi-disciplinary discharge planning process, led by the attending physician.  Recommendations may be updated based on patient status, additional functional criteria and insurance authorization.  Follow Up Recommendations  Skilled nursing-short term rehab (<3 hours/day)     Assistance Recommended at Discharge Frequent or constant Supervision/Assistance  Equipment Recommendations  None recommended by PT    Recommendations for Other Services       Precautions / Restrictions Precautions Precautions: Fall     Mobility  Bed Mobility Overal bed mobility: Needs Assistance Bed Mobility: Supine to Sit;Sit to Sidelying     Supine to sit: Total assist;+2 for physical assistance;HOB elevated   Sit to sidelying: Total assist;+2 for physical assistance General bed mobility comments: chair-like position in bed and pivoted to dangle EOB; return to sidelying with trunk supported and legs lifted up to bed    Transfers                        Ambulation/Gait                 Stairs             Wheelchair Mobility    Modified Rankin (Stroke Patients Only)       Balance Overall balance  assessment: Needs assistance Sitting-balance support: Single extremity supported;Feet supported Sitting balance-Leahy Scale: Poor Sitting balance - Comments: required min assist to maintain neutral; worked on rotation with PROM of torso; leaning onto rt elbow/forearm and pt assisted with push up to sitting x 1 of 3 trials; kicking RLE with mild posterior LOB, unable to kick LLE Postural control: Posterior lean                                  Cognition Arousal/Alertness: Awake/alert (initially asleep, but once awake maintained eyes open x 20 minutes) Behavior During Therapy: Flat affect Overall Cognitive Status: Difficult to assess                                 General Comments: followed multiple one step commands        Exercises Other Exercises Other Exercises: AAROM left hand and positioned on folded towel for finger extension. Wife reports he has a splint he was wearing at night at SNF Other Exercises: AAROM Rt LAQ in sitting x 5 reps; PROM left x 3 reps    General Comments General comments (skin integrity, edema, etc.): Wife present during session      Pertinent Vitals/Pain Pain Assessment: Faces Faces Pain Scale: No hurt Breathing: normal Negative Vocalization: none Facial Expression: smiling or  inexpressive Body Language: relaxed Consolability: no need to console PAINAD Score: 0    Home Living                          Prior Function            PT Goals (current goals can now be found in the care plan section) Acute Rehab PT Goals Patient Stated Goal: unable Time For Goal Achievement: 01/12/21 Potential to Achieve Goals: Fair Progress towards PT goals: Progressing toward goals    Frequency    Min 2X/week      PT Plan Current plan remains appropriate    Co-evaluation              AM-PAC PT "6 Clicks" Mobility   Outcome Measure  Help needed turning from your back to your side while in a flat bed without  using bedrails?: Total Help needed moving from lying on your back to sitting on the side of a flat bed without using bedrails?: Total Help needed moving to and from a bed to a chair (including a wheelchair)?: Total Help needed standing up from a chair using your arms (e.g., wheelchair or bedside chair)?: Total Help needed to walk in hospital room?: Total Help needed climbing 3-5 steps with a railing? : Total 6 Click Score: 6    End of Session   Activity Tolerance: Patient tolerated treatment well (VSS) Patient left: in bed;with family/visitor present;with bed alarm set   PT Visit Diagnosis: Hemiplegia and hemiparesis Hemiplegia - Right/Left: Left Hemiplegia - dominant/non-dominant: Non-dominant Hemiplegia - caused by: Cerebral infarction     Time: 6767-2094 PT Time Calculation (min) (ACUTE ONLY): 31 min  Charges:  $Therapeutic Activity: 23-37 mins                      Arby Barrette, PT Acute Rehabilitation Services  Pager [phone] Office [phone]    Rexanne Mano 12/31/2020, 2:48 PM

## 2020-12-31 NOTE — Progress Notes (Signed)
Initial Nutrition Assessment  DOCUMENTATION CODES:   Severe malnutrition in context of chronic illness  INTERVENTION:   Continue Multivitamin w/ minerals daily  Continue Hormel Shake TID, each supplement provides 520 kcal and 22 grams of protein  Encourage good PO intake  NUTRITION DIAGNOSIS:   Severe Malnutrition related to chronic illness (CHF, Stroke) as evidenced by percent weight loss, moderate fat depletion, severe muscle depletion. - Ongoing  GOAL:   Patient will meet greater than or equal to 90% of their needs - Ongoing  MONITOR:   TF tolerance, Labs, Weight trends, Skin, I & O's  REASON FOR ASSESSMENT:   Malnutrition Screening Tool   ASSESSMENT:   80 y.o. male presented to the ED with lethargy and change in mental status, pt coming from SNF. PMH includes stroke in May and June of 2022, CHF, diverticulosis, HTN, and T2DM. PT admitted with UTI, dehydration, and Stage II pressure injury.   10/10 - Dys 3, Nectar Liquids 10/14 - NPO, Cortrak Placement (stomach) - TF initiated  10/18 - Dys 1, Nectar Liquids 10/21 - Dys 3, Nectar Liquids 10/26 - NPO 10/27 - Dys 3, Nectar Liquids, 1800 mL Fluid Restriction  Talked with wife at bedside, pt very lethargic and sleeping at time of visit. Per wife, pt had an impacted bowel after developing an ileus (resolved); had 3 episodes of emesis yesterday. Above statement is the reason that the pt is now NPO.   Wife reports that pt was doing pretty good up until yesterday. Reports that he is drinking 2-2 1/2 Hormel shakes.   Per EMR, pt intake includes: 10/22: Lunch 25% 10/23: Breakfast 25%, Dinner 75% 10/24: Breakfast 50%, Lunch 50% 10/25: Breakfast 50%, Lunch 50% 10/26: Breakfast 50%  Discussed with wife that if pt is unable to advance diet we may start TF again. Wife also brought up nocturnal TF, will continue to monitor pt and determine if re-initiation of TF is appropriate.  Medications reviewed and include: Dulcolax,  Colace, SSI 0-9 untis TID, Magonate, MVI, IV antibiotics Labs reviewed: 24 hr BG trends 109-159  Admission Weight: 75.4 kg Current Weight: 72.7 kg  UOP: 850 mL + 1 unmeasured occurrence x 24 hours I/O's: +8.6 L since admit  Diet Order:   Diet Order             DIET DYS 3 Room service appropriate? Yes; Fluid consistency: Nectar Thick; Fluid restriction: 1800 mL Fluid  Diet effective now                   EDUCATION NEEDS:   No education needs have been identified at this time  Skin:  Skin Assessment: Skin Integrity Issues: Skin Integrity Issues:: Stage I, Stage II Stage I: Buttocks Stage II: L Elbow  Last BM:  12/31/2020 - Per wife  Height:   Ht Readings from Last 1 Encounters:  12/14/20 5\' 7"  (1.702 m)    Weight:   Wt Readings from Last 1 Encounters:  12/31/20 72.7 kg    Ideal Body Weight:  67.3 kg  BMI:  Body mass index is 25.1 kg/m.  Estimated Nutritional Needs:   Kcal:  2100-2300  Protein:  105-120 grams  Fluid:  >/= 2.1 L    Alaiya Martindelcampo BS, PLDN Clinical Dietitian See AMiON for contact information.

## 2020-12-31 NOTE — Progress Notes (Signed)
PROGRESS NOTE        PATIENT DETAILS Name: [name] [name] Age: 80 y.o. Sex: male Date of Birth: January 29, 1941 Admit Date: 12/14/2020 Admitting Physician Georgette Shell, MD GBT:DVVOH, Arvid Right, MD  Brief Narrative: Patient is a 80 y.o. 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  Hospital course complicated by prolonged encephalopathy. Thankully starting on 10/19-encephalopathy has started to improve.  See below for further details.   Pertinent Labs/Radiology: Hb: 7.9 Na: 136 K: 3.9  creatinine: 0.74  AST/ALT: 69/127  Microbiology: 10/10>>Blood culture: Enterococcus Enterococcus faecalis 10/10>> urine culture: Staff aureus 10/11>> blood culture: 1/2-Enterococcus faecalis 10/13>> blood culture: 1/2-Enterococcus faecalis 10/15>> blood culture: Staff epidermidis. 10/17>> blood culture: No growth  Radiology/Echo/EEG: 10/10>> CT head: Small right subdural hematoma 10/11>> TTE: EF-65-70%, no obvious vegetation. 10/13-10/14>> LTM EEG: No seizures. 10/14>> TEE: Prosthetic mitral valve vegetation 10/14>> MRI brain: Acute infarct left cerebellar vermis, small subacute hematoma in the right frontal lobe.   Subjective:   Patient in bed, in no distress but sleepy today - gradually waking up, denies headache.   Objective: Vitals: Blood pressure 125/75, pulse 72, temperature (!) 97.5 F (36.4 C), temperature source Axillary, resp. rate 17, height 5\' 7"  (1.702 m), weight 72.7 kg,  SpO2 97 %.   Exam:  Less Awake, weak, L sided weakness 0/5, Cor track in place Industry.AT,PERRAL Supple Neck, No JVD,   Symmetrical Chest wall movement, Good air movement bilaterally, CTAB RRR,No Gallops, Rubs or new Murmurs,  +ve B.Sounds, Abd Soft, No tenderness,   No Cyanosis, Clubbing or edema,        Assessment/Plan:  Acute metabolic encephalopathy: Due to hypernatremia/enterococcal bacteremia-had prolonged encephalopathy-but since 10/19-gradually improving.  Gradual but consistent improvement in mental status.  Remains on amantadine 200 mg twice daily for arousal.  Slightly more sleepy on 12/31/2020 due to multiple BMs overnight preventing him to get good sleep, hydrate, feed only when he is awake and wants to eat, counseled wife.  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  Repeat blood culture on 10/17 negative so far.  Now on Rocephin/ampicillin-with stop date of 01/28/2021 >> then amoxicillin 500 mg twice daily indefinitely.  Plan to place PICC line sometime next week prior to discharge.  Right wrist tenderness: Unclear etiology-no evidence of erythema swelling on exam.  Uric acid levels unremarkable.  X-ray suggestive of arthritis-better with supportive care and application of Voltaren gel.  Watch and follow.   Transaminitis: Suspect this is multifactorial from statin/Rocephin/underlying bacteremia.  Abdomen is soft.  Statin discontinued on 10/19-LFTs have started to downtrend.  Continue to hold statin-and follow trend.    Normocytic anemia: Due to acute illness-no evidence of blood loss-watch/follow CBC closely.  PAF: Continue Tikosyn (pharmacy dosing)-initially on Eliquis-has been switched to IV heparin-if he remains clinically stable Eliquis in a few  days.  Acute left cerebellar vermis CVA: Felt to be a septic emboli  in the setting of mitral valve endocarditis.    History of seizure disorder: Initially on Keppra-given lethargy/encephalopathy-switched to Vimpat on 10/16.  LTM EEG negative for seizures.    Recent SDH May 2022: This appears stable on recent imaging.  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  Continues to have significant left-sided hemiparesis.  On therapeutic anticoagulation with IV heparin-with plans to switch back to Eliquis prior to discharge.  History of rheumatic heart disease-s/p remote AVR/MVR (bioprosthetic valve)  Severe failure to thrive syndrome/frailty/poor oral intake/goals of care: Encephalopathy/failure to thrive syndrome have markedly improved-he is now starting to consume more oral intake.  Core track has been clamped off since 12/28/2020 dietitian following calorie count stable with oral intake.  Constipation with Mild Ileus - placed on B.regimen, resolved.     Goals of care: Long discussion with spouse/2 sons on 10/20 by this MD, goals of care as follows. 1.DNR  2.Family fully well aware that it is unlikely that after this acute illness-patient will achieve his prior level of functioning/mental acuity.  However, if he is able to interact and enjoy some quality time with family/grandkids (even in a wheelchair)-they feel that this is something that the patient would want. 3.Calorie count over the weekend-if mental/clinical status continues to improve-okay to proceed with PEG tube placement if felt to be needed. 4.Apart from PEG tube placement-no further aggressive care.  Family aware that patient is not a candidate for redo valve replacement surgery etc.   5.Family wants to give it several weeks-to allow for further mental status/functional improvement.Family okay with being discharged to SNF  with peg tube (if needed)/PICC line-and palliative care follow-up.   6.If while at SNF-if felt to have no significant improvement in quality of life or if he suffers a significant decompensation-family willing to consider transitioning to comfort measures then.  7.They would like treatment with antibiotics/IV fluids/hospitalization for mild to moderate infection/electrolyte disorders. 8. Family aware that if in the interim (while hospitalized)-patient has significant decline/acute decompensation-organ damage/ICU level of care-family would be okay to transitioning to comfort measures in that event.    Nutrition Status: Nutrition Problem: Severe Malnutrition Etiology: chronic illness (CHF, Stroke) Signs/Symptoms: percent weight loss, moderate fat depletion, severe muscle depletion Percent weight loss: 12 % Interventions: Tube feeding, Prostat, MVI    Procedures: None Consults: Cardiology, neurology, infectious disease DVT Prophylaxis: Eliquis >> Hep gtt Code Status:DNR Family Communication: [name] at bedside 12/27/20, 12/28/20, 12/29/20, 12/30/20, 12/31/20  Time spent: 35 minutes-Greater than 50% of this time was spent in counseling, explanation of diagnosis, planning of further management, and coordination of care.  Diet: Diet Order             DIET DYS 3 Room service appropriate? Yes; Fluid consistency: Nectar Thick; Fluid restriction: 1800 mL Fluid  Diet effective now                      Disposition Plan: Status is: Inpatient  Remains inpatient appropriate because:Inpatient level of care appropriate due to severity of illness  Dispo: The patient is from: SNF              Anticipated d/c is to: SNF              Patient currently is not medically stable to d/c.   Difficult to place patient No    Barriers to Discharge: Persistent encephalopathy-persistent bacteremia with Enterococcus-on  IV antibiotics-NG tube feedings ongoing due to poor oral  intake.  Antimicrobial agents: Anti-infectives (From admission, onward)    Start     Dose/Rate Route Frequency Ordered Stop   12/25/20 1600  ampicillin (OMNIPEN) 2 g in sodium chloride 0.9 % 100 mL IVPB        2 g 300 mL/hr over 20 Minutes Intravenous Every 4 hours 12/25/20 1227 01/28/21 2359   12/18/20 1500  cefTRIAXone (ROCEPHIN) 2 g in sodium chloride 0.9 % 100 mL IVPB        2 g 200 mL/hr over 30 Minutes Intravenous Every 12 hours 12/18/20 1344 01/28/21 2359   12/15/20 1700  cefTRIAXone (ROCEPHIN) 1 g in sodium chloride 0.9 % 100 mL IVPB  Status:  Discontinued        1 g 200 mL/hr over 30 Minutes Intravenous Every 24 hours 12/14/20 1648 12/15/20 0624   12/15/20 0630  ampicillin (OMNIPEN) 2 g in sodium chloride 0.9 % 100 mL IVPB  Status:  Discontinued        2 g 300 mL/hr over 20 Minutes Intravenous Every 6 hours 12/15/20 0624 12/25/20 1227   12/15/20 0630  gentamicin (GARAMYCIN) IVPB 80 mg        80 mg 100 mL/hr over 30 Minutes Intravenous  Once 12/15/20 0628 12/15/20 0732   12/14/20 1645  cefTRIAXone (ROCEPHIN) 1 g in sodium chloride 0.9 % 100 mL IVPB        1 g 200 mL/hr over 30 Minutes Intravenous  Once 12/14/20 1642 12/14/20 1830        MEDICATIONS: Scheduled Meds:  amantadine  200 mg Oral BID   diclofenac Sodium  4 g Topical QID   docusate sodium  200 mg Oral BID   dofetilide  375 mcg Oral BID   doxazosin  1 mg Oral Daily   finasteride  5 mg Oral Daily   fluocinonide cream   Topical BID   furosemide  20 mg Intravenous Once   insulin aspart  0-9 Units Subcutaneous TID WC   lacosamide  50 mg Oral BID   magnesium gluconate  500 mg Oral Daily   mouth rinse  15 mL Mouth Rinse BID   multivitamin with minerals  1 tablet Oral Daily   Continuous Infusions:  ampicillin (OMNIPEN) IV 2 g (12/31/20 0841)   cefTRIAXone (ROCEPHIN)  IV 2 g (12/31/20 1017)   dextrose 5 % and 0.9 % NaCl with KCl 40 mEq/L     heparin 1,250 Units/hr (12/30/20 1757)   PRN Meds:.ondansetron  (ZOFRAN) IV   I have personally reviewed following labs and imaging studies  LABORATORY DATA:  Recent Labs  Lab 12/27/20 0101 12/28/20 0137 12/29/20 0407 12/30/20 0211 12/31/20 0101  WBC 11.7* 11.9* 12.4* 14.3* 13.0*  HGB 8.2* 7.8* 7.5* 8.5* 8.2*  HCT 26.4* 24.8* 23.8* 26.8* 25.7*  PLT 258 250 243 268 268  MCV 89.8 90.2 89.1 90.2 89.9  MCH 27.9 28.4 28.1 28.6 28.7  MCHC 31.1 31.5 31.5 31.7 31.9  RDW 16.4* 16.8* 17.2* 17.8* 17.9*    Recent Labs  Lab 12/27/20 0101 12/28/20 0137 12/29/20 0407 12/30/20 0211 12/31/20 0101  NA 136 133* 135 135 136  K 4.1 4.0 3.8 3.9 4.2  CL 104 103 104 103 105  CO2 24 23 24 22 25   GLUCOSE 178* 157* 103* 126* 149*  BUN 18 19 17 17 14   CREATININE 0.86 0.94 0.82 0.86 0.83  CALCIUM 7.8* 7.7* 7.7* 8.3* 8.4*  AST 69* 45* 41  47* 45*  ALT 124* 96* 80* 89* 80*  ALKPHOS 93 87 87 109 94  BILITOT 0.3 0.4 0.6 0.5 0.3  ALBUMIN 1.7* 1.7* 1.7* 1.9* 1.8*  MG 2.1 1.9 2.0 1.9 2.2  PROCALCITON  --   --  0.13 <0.10 0.10  BNP  --   --  207.1* 225.4* 277.2*     Urine analysis:    Component Value Date/Time   COLORURINE YELLOW 12/14/2020 1600   APPEARANCEUR CLOUDY (A) 12/14/2020 1600   LABSPEC 1.020 12/14/2020 1600   PHURINE 6.0 12/14/2020 1600   GLUCOSEU NEGATIVE 12/14/2020 1600   HGBUR LARGE (A) 12/14/2020 1600   BILIRUBINUR NEGATIVE 12/14/2020 1600   BILIRUBINUR negative 11/22/2013 0848   KETONESUR NEGATIVE 12/14/2020 1600   PROTEINUR NEGATIVE 12/14/2020 1600   UROBILINOGEN 0.2 11/22/2013 0848   UROBILINOGEN 1.0 08/05/2013 2207   NITRITE POSITIVE (A) 12/14/2020 1600   LEUKOCYTESUR LARGE (A) 12/14/2020 1600    Sepsis Labs: Lactic Acid, Venous    Component Value Date/Time   LATICACIDVEN 1.8 12/14/2020 2056     RADIOLOGY STUDIES/RESULTS: DG Chest Port 1 View  Result Date: 12/31/2020 CLINICAL DATA:  Shortness of breath EXAM: PORTABLE CHEST 1 VIEW COMPARISON:  Chest x-ray dated December 30, 2020 FINDINGS: Cardiac and mediastinal  contours are unchanged post median sternotomy. Slightly increased bilateral central opacities. No large pleural effusion or evidence of pneumothorax. Enteric tube partially visualized coursing below the diaphragm. IMPRESSION: Slightly increased bilateral interstitial opacities, likely due to worsening pulmonary edema. Electronically Signed   By: Yetta Glassman M.D.   On: 12/31/2020 08:11   DG Chest Port 1 View  Result Date: 12/30/2020 CLINICAL DATA:  Stroke, AFib, sepsis EXAM: PORTABLE CHEST 1 VIEW COMPARISON:  Radiograph 12/29/2020 FINDINGS: Feeding tube passes below diaphragm, tip excluded by collimation. Prior median sternotomy and valve replacement. Unchanged cardiomegaly. Diffuse interstitial opacities, similar to prior exam. No large pleural effusion or visible pneumothorax. Bones are unchanged. IMPRESSION: Unchanged cardiomegaly. Diffuse interstitial opacities consistent with pulmonary edema. Electronically Signed   By: Maurine Simmering M.D.   On: 12/30/2020 08:45   DG Abd Portable 1V  Result Date: 12/31/2020 CLINICAL DATA:  Ileus. EXAM: PORTABLE ABDOMEN - 1 VIEW COMPARISON:  December 30, 2020. FINDINGS: Distal tip of feeding tube is seen in expected position of distal stomach. No significant bowel dilatation is seen at this time. Phleboliths are noted in the pelvis. IMPRESSION: No significant abnormal bowel dilatation is seen at this time. Electronically Signed   By: Marijo Conception M.D.   On: 12/31/2020 08:13   DG Abd Portable 1V  Result Date: 12/30/2020 CLINICAL DATA:  Nausea and vomiting. EXAM: PORTABLE ABDOMEN - 1 VIEW COMPARISON:  Same-day abdominal radiograph at 0550 hours FINDINGS: Enteric tube tip overlies expected location of the gastric antrum. Multiple gas-filled dilated loops of colon, similar to earlier radiograph. Mild-to-moderate stool burden within the ascending colon. No definite dilated small bowel loops. No free intraperitoneal air given the limits of the supine image. Partially  visualized right hip arthroplasty. IMPRESSION: Stable position of the enteric tube, overlying the expected location of the gastric antrum. Unchanged exam compared to earlier same day radiograph with persistent diffuse gaseous dilatation of the colon. Electronically Signed   By: Ileana Roup M.D.   On: 12/30/2020 15:34   DG Abd Portable 1V  Result Date: 12/30/2020 CLINICAL DATA:  Nausea EXAM: PORTABLE ABDOMEN - 1 VIEW COMPARISON:  04/20/2020 FINDINGS: Feeding tube tip overlies the distal stomach. There is diffuse gaseous distension of the  colon and moderate stool burden in the ascending colon and rectum. No clear small bowel dilation. Prior right total hip arthroplasty. Lumbar spine degenerative changes. No acute osseous abnormality. IMPRESSION: Diffuse gaseous distension of the colon with moderate stool burden in the ascending colon and rectum. Feeding tube tip overlies the distal stomach. Electronically Signed   By: Maurine Simmering M.D.   On: 12/30/2020 08:42     LOS: 16 days   Signature  Lala Lund M.D on 12/31/2020 at 12:22 PM   -  To page go to [email or web address]

## 2020-12-31 NOTE — Progress Notes (Signed)
Speech Language Pathology Treatment: Dysphagia  Patient Details Name: [name] [name] MRN: [id number] DOB: November 26, 1940 Today's Date: 12/31/2020 Time: 1115-5208 SLP Time Calculation (min) (ACUTE ONLY): 25 min  Assessment / Plan / Recommendation Clinical Impression  Pt was seen for dysphagia treatment with his wife present. Pt was lethargic throughout the session, but this improved with thermotactile stimulation with wet paper towel to face. Pt tolerated puree solids and nectar thick liquids via cup without overt s/sx of aspiration. Mild oral holding was noted, but oral clearance was ultimately WFL. Pt's wife expressed that the SLP at the SNF has not been able to do many dysphagia exercises with the pt and that she questions the facility's familiarity with CVAs and TBIs. Pt's most recent MBS was in June and pt's wife is amenable to completion of a repeat MBS to re-assess swallow function. This will be planned for 10/28.    HPI HPI: Pt is a 80 y.o. male who was admitted 10/10 for confusion/decreased mentation, and poor intake, Pt found to have enterococcus faecalis bacteremia, encephalopathy, small left cerebellar vermis CVA, constipation with mild illeus which resolved. 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 Pt has a hx of cognitive deficits and dysphagia, with most recent MBS on 09/02/20 and most recent diet recommendations for Dysphagia 1 solids and honey-thick liquids.  Per family, pt has since advanced to mechanical soft solid and NTL.  Cortrak placed 12/18/20.      SLP Plan  MBS      Recommendations for follow up therapy are one component of a multi-disciplinary discharge planning process, led by the attending physician.  Recommendations may be updated  based on patient status, additional functional criteria and insurance authorization.    Recommendations  Diet recommendations: Dysphagia 3 (mechanical soft);Nectar-thick liquid Liquids provided via: Cup;Teaspoon;No straw Medication Administration: Crushed with puree Supervision: Trained caregiver to feed patient Compensations: Slow rate;Small sips/bites Postural Changes and/or Swallow Maneuvers: Seated upright 90 degrees                Oral Care Recommendations: Oral care BID;Staff/trained caregiver to provide oral care Follow up Recommendations: 24 hour supervision/assistance;Skilled Nursing facility SLP Visit Diagnosis: Dysphagia, unspecified (R13.10) Plan: MBS       [name] [name], [name] [name], [name] [name] Office number [phone] Pager [name] [name]  12/31/2020, 4:52 PM

## 2020-12-31 NOTE — Progress Notes (Signed)
ANTICOAGULATION CONSULT NOTE  Pharmacy Consult:  Heparin (Apixaban on hold) Indication: atrial fibrillation  Allergies  Allergen Reactions   Naproxen Hives   No [employer] Food Allergies Other (See Comments)    Scallops - distress, nausea and vomitting    Patient Measurements: Height: 5\' 7"  (170.2 cm) Weight: 72.7 kg (160 lb 4.4 oz) IBW/kg (Calculated) : 66.1 Heparin Dosing Weight: 82 kg  Vital Signs: Temp: 98.2 F (36.8 C) (10/27 0400) Temp Source: Axillary (10/27 0400) BP: 134/84 (10/27 0400) Pulse Rate: 77 (10/27 0400)  Labs: Recent Labs    12/29/20 0407 12/30/20 0211 12/31/20 0101  HGB 7.5* 8.5* 8.2*  HCT 23.8* 26.8* 25.7*  PLT 243 268 268  APTT 72*  --   --   HEPARINUNFRC 0.30 0.30 0.30  CREATININE 0.82 0.86 0.83     Estimated Creatinine Clearance: 68.6 mL/min (by C-G formula based on SCr of 0.83 mg/dL).   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  Patient may need a PEG tube, so Pharmacy consulted to  transition patient to IV heparin to prepare for the procedure.  CBC stable; no bleeding reported.  Noted patient has a recent history of SDH requiring bur hole evacuation in May and a CVA in June.  Heparin level remains therapeutic at 0.3, CBC stable. No s/sx of bleeding or infusion issues noted.  Goal of Therapy:  Heparin level 0.3-0.5 units/ml Monitor platelets by anticoagulation protocol: Yes   Plan:  Continue heparin 1250 units/hr Daily heparin level and CBC F/u plan to switch back to apixaban prior to discharge   Antonietta Jewel, PharmD, [place] Pharmacist  Phone: [phone] 12/31/2020 7:50 AM  Please check AMION for all [place] phone numbers After 10:00 PM, call [place] [phone]

## 2021-01-01 ENCOUNTER — Inpatient Hospital Stay: Payer: Self-pay

## 2021-01-01 ENCOUNTER — Inpatient Hospital Stay (HOSPITAL_COMMUNITY): Payer: Medicare Other

## 2021-01-01 DIAGNOSIS — R400 Somnolence: Secondary | ICD-10-CM | POA: Diagnosis not present

## 2021-01-01 LAB — COMPREHENSIVE METABOLIC PANEL
ALT: 68 U/L — ABNORMAL HIGH (ref 0–44)
AST: 37 U/L (ref 15–41)
Albumin: 1.9 g/dL — ABNORMAL LOW (ref 3.5–5.0)
Alkaline Phosphatase: 88 U/L (ref 38–126)
Anion gap: 6 (ref 5–15)
BUN: 12 mg/dL (ref 8–23)
CO2: 26 mmol/L (ref 22–32)
Calcium: 8.3 mg/dL — ABNORMAL LOW (ref 8.9–10.3)
Chloride: 106 mmol/L (ref 98–111)
Creatinine, Ser: 0.84 mg/dL (ref 0.61–1.24)
GFR, Estimated: >60 mL/min (ref >60)
Glucose, Bld: 104 mg/dL — ABNORMAL HIGH (ref 70–99)
Potassium: 4.2 mmol/L (ref 3.5–5.1)
Sodium: 138 mmol/L (ref 135–145)
Total Bilirubin: 0.5 mg/dL (ref 0.3–1.2)
Total Protein: 6.3 g/dL — ABNORMAL LOW (ref 6.5–8.1)

## 2021-01-01 LAB — CBC
HCT: 24.4 % — ABNORMAL LOW (ref 39.0–52.0)
Hemoglobin: 7.6 g/dL — ABNORMAL LOW (ref 13.0–17.0)
MCH: 28.5 pg (ref 26.0–34.0)
MCHC: 31.1 g/dL (ref 30.0–36.0)
MCV: 91.4 fL (ref 80.0–100.0)
Platelets: 252 10*3/uL (ref 150–400)
RBC: 2.67 MIL/uL — ABNORMAL LOW (ref 4.22–5.81)
RDW: 18 % — ABNORMAL HIGH (ref 11.5–15.5)
WBC: 10.5 10*3/uL (ref 4.0–10.5)
nRBC: 0 % (ref 0.0–0.2)

## 2021-01-01 LAB — BASIC METABOLIC PANEL
Anion gap: 7 (ref 5–15)
BUN: 13 mg/dL (ref 8–23)
CO2: 22 mmol/L (ref 22–32)
Calcium: 8.2 mg/dL — ABNORMAL LOW (ref 8.9–10.3)
Chloride: 109 mmol/L (ref 98–111)
Creatinine, Ser: 0.84 mg/dL (ref 0.61–1.24)
GFR, Estimated: >60 mL/min (ref >60)
Glucose, Bld: 135 mg/dL — ABNORMAL HIGH (ref 70–99)
Potassium: 3.8 mmol/L (ref 3.5–5.1)
Sodium: 138 mmol/L (ref 135–145)

## 2021-01-01 LAB — GLUCOSE, CAPILLARY
Glucose-Capillary: 105 mg/dL — ABNORMAL HIGH (ref 70–99)
Glucose-Capillary: 105 mg/dL — ABNORMAL HIGH (ref 70–99)
Glucose-Capillary: 107 mg/dL — ABNORMAL HIGH (ref 70–99)
Glucose-Capillary: 116 mg/dL — ABNORMAL HIGH (ref 70–99)
Glucose-Capillary: 125 mg/dL — ABNORMAL HIGH (ref 70–99)
Glucose-Capillary: 148 mg/dL — ABNORMAL HIGH (ref 70–99)

## 2021-01-01 LAB — MAGNESIUM
Magnesium: 2.1 mg/dL (ref 1.7–2.4)
Magnesium: 2.1 mg/dL (ref 1.7–2.4)

## 2021-01-01 LAB — BRAIN NATRIURETIC PEPTIDE: B Natriuretic Peptide: 148.8 pg/mL — ABNORMAL HIGH (ref 0.0–100.0)

## 2021-01-01 LAB — HEPARIN LEVEL (UNFRACTIONATED): Heparin Unfractionated: 0.19 IU/mL — ABNORMAL LOW (ref 0.30–0.70)

## 2021-01-01 LAB — PROCALCITONIN: Procalcitonin: <0.1 ng/mL

## 2021-01-01 MED ORDER — FAMOTIDINE 20 MG PO TABS
20.0000 mg | ORAL_TABLET | Freq: Every day | ORAL | Status: DC
  Administered 2021-01-01 – 2021-01-05 (×5): 20 mg via ORAL
  Filled 2021-01-01 (×5): qty 1

## 2021-01-01 MED ORDER — DOCUSATE SODIUM 100 MG PO CAPS
100.0000 mg | ORAL_CAPSULE | Freq: Two times a day (BID) | ORAL | Status: DC
  Administered 2021-01-01 – 2021-01-02 (×2): 100 mg via ORAL
  Filled 2021-01-01 (×4): qty 1

## 2021-01-01 MED ORDER — BISACODYL 10 MG RE SUPP: 10.0000 mg | Freq: Every day | RECTAL | Status: DC | PRN

## 2021-01-01 MED ORDER — ATORVASTATIN CALCIUM 80 MG PO TABS
80.0000 mg | ORAL_TABLET | Freq: Every day | ORAL | Status: DC
  Administered 2021-01-01 – 2021-01-05 (×5): 80 mg via ORAL
  Filled 2021-01-01 (×5): qty 1

## 2021-01-01 MED ORDER — APIXABAN 5 MG PO TABS
5.0000 mg | ORAL_TABLET | Freq: Two times a day (BID) | ORAL | Status: DC
  Administered 2021-01-01 – 2021-01-05 (×9): 5 mg via ORAL
  Filled 2021-01-01 (×9): qty 1

## 2021-01-01 MED ORDER — POTASSIUM CHLORIDE CRYS ER 20 MEQ PO TBCR
30.0000 meq | EXTENDED_RELEASE_TABLET | Freq: Once | ORAL | Status: AC
  Administered 2021-01-01: 30 meq via ORAL
  Filled 2021-01-01: qty 1

## 2021-01-01 MED ORDER — POTASSIUM CHLORIDE CRYS ER 20 MEQ PO TBCR: 20.0000 meq | EXTENDED_RELEASE_TABLET | Freq: Once | ORAL | Status: DC

## 2021-01-01 NOTE — TOC Progression Note (Signed)
Transition of Care [facility]) - Progression Note    Patient Details  Name: [name] [name] MRN: [id number] Date of Birth: August 21, 1940  Transition of Care [facility]) CM/SW [place], [place] Phone Number: 01/01/2021, 11:37 AM  Clinical Narrative:    HF CSW spoke with [facility] to clarify about DC over the weekend. Butch Penny at [facility] reported that they do take discharges over the weekend but prefer not to but they can. Butch Penny reported they will need a negative COVID test the day before discharge and DC summary. Weekend nurse report to [phone] and regular line for admission [phone].  CSW will continue to follow throughout discharge.   Expected Discharge Plan: [place] Barriers to Discharge: Continued Medical Work up  Expected Discharge Plan and Services Expected Discharge Plan: [place] In-house Referral: Clinical Social Work   Post Acute Care Choice: [place] Living arrangements for the past 2 months: [place]                                       Social Determinants of Health (SDOH) Interventions    Readmission Risk Interventions No flowsheet data found.  [place], MSW, [place] Heart Failure Social Worker

## 2021-01-01 NOTE — Progress Notes (Signed)
Modified Barium Swallow Progress Note  Patient Details  Name: [name] [name] MRN: [id number] Date of Birth: [birth year]  Today's Date: 01/01/2021  Modified Barium Swallow completed.  Full report located under Chart Review in the Imaging Section.  Brief recommendations include the following:  Clinical Impression  Pt presents with oropharyngeal dysphagia characterized by reduced bolus cohesion, reduced pharyngeal constriction, reduced anterior laryngeal movement and a pharyngeal delay. Brief oral holding was noted, but with adequate bolus containment. Premature spillage and the pharyngeal delay facilitated inconsistent penetration (PAS 5) with larger boluses of thin liquids via straw; however, no other instances of penetration/aspiration were noted during the study. Prompted coughing was ineffective in expelling the penetrate. Trace residue was noted in the pyriform sinuses and on the posterior pharyngeal wall, but this cleared with a liquid wash and secondary swallows. The barium tablet (taken whole with thin liquids) became lodged in the vallecule and subsequently in mid thoracic esophagus. However, transport to the esophagus was facilitated with a puree bolus, and esophageal clearance was achieved with a liquid wash. A dysphagia 3 diet with thin liquids is recommended at this time observance of swallowing precautions. SLP will continue to follow pt.   Swallow Evaluation Recommendations       SLP Diet Recommendations: Dysphagia 3 (Mech soft) solids;Thin liquid   Liquid Administration via: Cup;No straw   Medication Administration: Crushed with puree   Supervision:  (trained staff/caregiver to feed pt)   Compensations: Slow rate;Small sips/bites   Postural Changes: Seated upright at 90 degrees   Oral Care Recommendations: Oral care BID      [name], [place], [place] Office number [phone] Pager [employer] 01/01/2021,9:29 AM

## 2021-01-01 NOTE — Progress Notes (Signed)
ANTICOAGULATION CONSULT NOTE  Pharmacy Consult:  Heparin (Apixaban on hold) Indication: atrial fibrillation  Allergies  Allergen Reactions   Naproxen Hives   No [employer] Food Allergies Other (See Comments)    Scallops - distress, nausea and vomitting    Patient Measurements: Height: 5\' 7"  (170.2 cm) Weight: 72.7 kg (160 lb 4.4 oz) IBW/kg (Calculated) : 66.1 Heparin Dosing Weight: 82 kg  Vital Signs: Temp: 99 F (37.2 C) (10/28 0000) Temp Source: Axillary (10/28 0000) BP: 146/82 (10/28 0000) Pulse Rate: 74 (10/28 0000)  Labs: Recent Labs    12/29/20 0407 12/30/20 0211 12/31/20 0101 01/01/21 0042  HGB 7.5* 8.5* 8.2* 7.6*  HCT 23.8* 26.8* 25.7* 24.4*  PLT 243 268 268 252  APTT 72*  --   --   --   HEPARINUNFRC 0.30 0.30 0.30 0.19*  CREATININE 0.82 0.86 0.83  --      Estimated Creatinine Clearance: 68.6 mL/min (by C-G formula based on SCr of 0.83 mg/dL).   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  Patient may  need a PEG tube, so Pharmacy consulted to transition patient to IV heparin to prepare for the procedure.  CBC stable; no bleeding reported.  Noted patient has a recent history of SDH requiring bur hole evacuation in May and a CVA in June.  Heparin level down to subtherapeutic (0.19) on gtt at 1250 units/hr. No issues with line or bleeding reported per RN. Hgb down to 7.6.   Goal of Therapy:  Heparin level 0.3-0.5 units/ml Monitor platelets by anticoagulation protocol: Yes   Plan:  Increase heparin to 1350 units/hr F/u 8 hr heparin level  Sherlon Handing, PharmD, BCPS Please see amion for complete clinical pharmacist phone list 01/01/2021 3:01 AM

## 2021-01-01 NOTE — Progress Notes (Signed)
PROGRESS NOTE        PATIENT DETAILS Name: [name] [name] Age: 80 y.o. Sex: male Date of Birth: January 28, 1941 Admit Date: 12/14/2020 Admitting Physician Georgette Shell, MD OIN:OMVEH, Arvid Right, MD  Brief Narrative: Patient is a 80 y.o. 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  Hospital course complicated by prolonged encephalopathy. Thankully starting on 10/19-encephalopathy has started to improve.  See below for further details.   Pertinent Labs/Radiology: Hb: 7.9 Na: 136 K: 3.9  creatinine: 0.74  AST/ALT: 69/127  Microbiology: 10/10>>Blood culture: Enterococcus Enterococcus faecalis 10/10>> urine culture: Staff aureus 10/11>> blood culture: 1/2-Enterococcus faecalis 10/13>> blood culture: 1/2-Enterococcus faecalis 10/15>> blood culture: Staff epidermidis. 10/17>> blood culture: No growth  Radiology/Echo/EEG: 10/10>> CT head: Small right subdural hematoma 10/11>> TTE: EF-65-70%, no obvious vegetation. 10/13-10/14>> LTM EEG: No seizures. 10/14>> TEE: Prosthetic mitral valve vegetation 10/14>> MRI brain: Acute infarct left cerebellar vermis, small subacute hematoma in the right frontal lobe.   Subjective:   Patient in bed, in no distress but sleepy today - gradually waking up, denies headache.   Objective: Vitals: Blood pressure 130/75, pulse 79, temperature 97.9 F (36.6 C), temperature source Axillary, resp. rate 20, height 5\' 7"  (1.702 m), weight 82.1 kg, SpO2  96 %.   Exam:  Less Awake, weak, L sided weakness 0/5, Cor track in place [place].AT,PERRAL Supple Neck, No JVD,   Symmetrical Chest wall movement, Good air movement bilaterally, CTAB RRR,No Gallops, Rubs or new Murmurs,  +ve B.Sounds, Abd Soft, No tenderness,   No Cyanosis, Clubbing or edema,        Assessment/Plan:  Acute metabolic encephalopathy: Due to hypernatremia/enterococcal bacteremia-had prolonged encephalopathy-but since 10/19-gradually improving.  Gradual but consistent improvement in mental status.  Remains on amantadine 200 mg twice daily for arousal.   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  Repeat blood culture on 10/17 negative so far.  Now on Rocephin/ampicillin-with stop date of 01/28/2021 >> then amoxicillin 500 mg twice daily indefinitely.  Plan to place PICC line sometime next week prior to discharge.  Right wrist tenderness: Unclear etiology-no evidence of erythema swelling on exam.  Uric acid levels unremarkable.  X-ray suggestive of arthritis-better with supportive care and application of Voltaren gel.  Watch and follow.   Transaminitis: Suspect this is multifactorial from statin/Rocephin/underlying bacteremia.  Abdomen is soft.  Statin discontinued on 10/19-LFTs have started to downtrend.  Resume statin 01/01/21    Normocytic anemia: Due to acute illness-no evidence of blood loss-watch/follow CBC closely.  PAF: Continue Tikosyn (pharmacy dosing)-initially on Eliquis-has been switched to IV heparin-if he remains clinically stable Eliquis in a few days.  Acute left cerebellar vermis CVA: Felt to be a septic emboli in the setting of mitral valve endocarditis.    History of seizure disorder: Initially on Keppra-given  lethargy/encephalopathy-switched to Vimpat on 10/16.  LTM EEG negative for seizures.    Recent SDH  May 2022: This appears stable on recent imaging.  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  Continues to have significant left-sided hemiparesis.  On therapeutic anticoagulation with Eliquis.  History of rheumatic heart disease-s/p remote AVR/MVR (bioprosthetic valve)  Severe failure to thrive syndrome/frailty/poor oral intake/goals of care: Encephalopathy/failure to thrive syndrome have markedly improved-he is now starting to consume more oral intake.  Core track has been clamped off since 12/28/2020 dietitian following calorie count stable with oral intake, DC Core track on 01/01/21.  Constipation with Mild Ileus - placed on B.regimen, resolved.     Goals of care: Long discussion with spouse/2 sons on 10/20 by this MD, goals of care as follows. 1.DNR  2.Family fully well aware that it is unlikely that after this acute illness-patient will achieve his prior level of functioning/mental acuity.  However, if he is able to interact and enjoy some quality time with family/grandkids (even in a wheelchair)-they feel that this is something that the patient would want. 3.Calorie count over the weekend-if mental/clinical status continues to improve-okay to proceed with PEG tube placement if felt to be needed. 4.Apart from PEG tube placement-no further aggressive care.  Family aware that patient is not a candidate for redo valve replacement surgery etc.   5.Family wants to give it several weeks-to allow for further mental status/functional improvement.Family okay with being discharged to SNF with peg tube (if needed)/PICC line-and palliative care follow-up.   6.If while at SNF-if felt to have no significant improvement in quality of life or if he suffers a significant decompensation-family  willing to consider transitioning to comfort measures then.  7.They would like treatment with antibiotics/IV fluids/hospitalization for mild to moderate infection/electrolyte disorders. 8. Family aware that if in the interim (while hospitalized)-patient has significant decline/acute decompensation-organ damage/ICU level of care-family would be okay to transitioning to comfort measures in that event.    Nutrition Status: Nutrition Problem: Severe Malnutrition Etiology: chronic illness (CHF, Stroke) Signs/Symptoms: percent weight loss, moderate fat depletion, severe muscle depletion Percent weight loss: 12 % Interventions: MVI, Hormel Shake    Procedures: None Consults: Cardiology, neurology, infectious disease DVT Prophylaxis: Eliquis >> Hep gtt Code Status:DNR Family Communication: [name] at bedside 12/27/20, 12/28/20, 12/29/20, 12/30/20, 12/31/20, 01/01/21  Time spent: 35 minutes-Greater than 50% of this time was spent in counseling, explanation of diagnosis, planning of further management, and coordination of care.  Diet: Diet Order             DIET DYS 3 Room service appropriate? Yes; Fluid consistency: Thin; Fluid restriction: 1800 mL Fluid  Diet effective now                      Disposition Plan: Status is: Inpatient  Remains inpatient appropriate because:Inpatient level of care appropriate due to severity of illness  Dispo: The patient is from: SNF              Anticipated d/c is to: SNF              Patient currently is not medically stable to d/c.   Difficult to place patient No    Barriers to Discharge: Persistent encephalopathy-persistent bacteremia with Enterococcus-on IV antibiotics-NG tube feedings ongoing due to poor oral intake.  Antimicrobial agents: Anti-infectives (From admission, onward)    Start     Dose/Rate Route Frequency Ordered Stop   12/25/20 1600  ampicillin (OMNIPEN) 2 g  in sodium chloride 0.9 % 100 mL IVPB        2 g 300  mL/hr over 20 Minutes Intravenous Every 4 hours 12/25/20 1227 01/28/21 2359   12/18/20 1500  cefTRIAXone (ROCEPHIN) 2 g in sodium chloride 0.9 % 100 mL IVPB        2 g 200 mL/hr over 30 Minutes Intravenous Every 12 hours 12/18/20 1344 01/28/21 2359   12/15/20 1700  cefTRIAXone (ROCEPHIN) 1 g in sodium chloride 0.9 % 100 mL IVPB  Status:  Discontinued        1 g 200 mL/hr over 30 Minutes Intravenous Every 24 hours 12/14/20 1648 12/15/20 0624   12/15/20 0630  ampicillin (OMNIPEN) 2 g in sodium chloride 0.9 % 100 mL IVPB  Status:  Discontinued        2 g 300 mL/hr over 20 Minutes Intravenous Every 6 hours 12/15/20 0624 12/25/20 1227   12/15/20 0630  gentamicin (GARAMYCIN) IVPB 80 mg        80 mg 100 mL/hr over 30 Minutes Intravenous  Once 12/15/20 0628 12/15/20 0732   12/14/20 1645  cefTRIAXone (ROCEPHIN) 1 g in sodium chloride 0.9 % 100 mL IVPB        1 g 200 mL/hr over 30 Minutes Intravenous  Once 12/14/20 1642 12/14/20 1830        MEDICATIONS: Scheduled Meds:  amantadine  200 mg Oral BID   apixaban  5 mg Oral BID   diclofenac Sodium  4 g Topical QID   docusate sodium  100 mg Oral BID   dofetilide  375 mcg Oral BID   doxazosin  1 mg Oral Daily   finasteride  5 mg Oral Daily   fluocinonide cream   Topical BID   insulin aspart  0-9 Units Subcutaneous TID WC   lacosamide  50 mg Oral BID   magnesium gluconate  500 mg Oral Daily   mouth rinse  15 mL Mouth Rinse BID   multivitamin with minerals  1 tablet Oral Daily   Continuous Infusions:  ampicillin (OMNIPEN) IV 2 g (01/01/21 0805)   cefTRIAXone (ROCEPHIN)  IV 2 g (01/01/21 1055)   PRN Meds:.bisacodyl, ondansetron (ZOFRAN) IV   I have personally reviewed following labs and imaging studies  LABORATORY DATA:  Recent Labs  Lab 12/28/20 0137 12/29/20 0407 12/30/20 0211 12/31/20 0101 01/01/21 0042  WBC 11.9* 12.4* 14.3* 13.0* 10.5  HGB 7.8* 7.5* 8.5* 8.2* 7.6*  HCT 24.8* 23.8* 26.8* 25.7* 24.4*  PLT 250 243 268 268 252   MCV 90.2 89.1 90.2 89.9 91.4  MCH 28.4 28.1 28.6 28.7 28.5  MCHC 31.5 31.5 31.7 31.9 31.1  RDW 16.8* 17.2* 17.8* 17.9* 18.0*    Recent Labs  Lab 12/28/20 0137 12/29/20 0407 12/30/20 0211 12/31/20 0101 01/01/21 0042  NA 133* 135 135 136 138  K 4.0 3.8 3.9 4.2 4.2  CL 103 104 103 105 106  CO2 23 24 22 25 26   GLUCOSE 157* 103* 126* 149* 104*  BUN 19 17 17 14 12   CREATININE 0.94 0.82 0.86 0.83 0.84  CALCIUM 7.7* 7.7* 8.3* 8.4* 8.3*  AST 45* 41 47* 45* 37  ALT 96* 80* 89* 80* 68*  ALKPHOS 87 87 109 94 88  BILITOT 0.4 0.6 0.5 0.3 0.5  ALBUMIN 1.7* 1.7* 1.9* 1.8* 1.9*  MG 1.9 2.0 1.9 2.2 2.1  PROCALCITON  --  0.13 <0.10 0.10 <0.10  BNP  --  207.1* 225.4* 277.2* 148.8*     Urine  analysis:    Component Value Date/Time   COLORURINE YELLOW 12/14/2020 1600   APPEARANCEUR CLOUDY (A) 12/14/2020 1600   LABSPEC 1.020 12/14/2020 1600   PHURINE 6.0 12/14/2020 1600   GLUCOSEU NEGATIVE 12/14/2020 1600   HGBUR LARGE (A) 12/14/2020 1600   BILIRUBINUR NEGATIVE 12/14/2020 1600   BILIRUBINUR negative 11/22/2013 0848   KETONESUR NEGATIVE 12/14/2020 1600   PROTEINUR NEGATIVE 12/14/2020 1600   UROBILINOGEN 0.2 11/22/2013 0848   UROBILINOGEN 1.0 08/05/2013 2207   NITRITE POSITIVE (A) 12/14/2020 1600   LEUKOCYTESUR LARGE (A) 12/14/2020 1600    Sepsis Labs: Lactic Acid, Venous    Component Value Date/Time   LATICACIDVEN 1.8 12/14/2020 2056     RADIOLOGY STUDIES/RESULTS: DG Chest Port 1 View  Result Date: 12/31/2020 CLINICAL DATA:  Shortness of breath EXAM: PORTABLE CHEST 1 VIEW COMPARISON:  Chest x-ray dated December 30, 2020 FINDINGS: Cardiac and mediastinal contours are unchanged post median sternotomy. Slightly increased bilateral central opacities. No large pleural effusion or evidence of pneumothorax. Enteric tube partially visualized coursing below the diaphragm. IMPRESSION: Slightly increased bilateral interstitial opacities, likely due to worsening pulmonary edema.  Electronically Signed   By: Yetta Glassman M.D.   On: 12/31/2020 08:11   DG Abd Portable 1V  Result Date: 12/31/2020 CLINICAL DATA:  Ileus. EXAM: PORTABLE ABDOMEN - 1 VIEW COMPARISON:  December 30, 2020. FINDINGS: Distal tip of feeding tube is seen in expected position of distal stomach. No significant bowel dilatation is seen at this time. Phleboliths are noted in the pelvis. IMPRESSION: No significant abnormal bowel dilatation is seen at this time. Electronically Signed   By: Marijo Conception M.D.   On: 12/31/2020 08:13   DG Abd Portable 1V  Result Date: 12/30/2020 CLINICAL DATA:  Nausea and vomiting. EXAM: PORTABLE ABDOMEN - 1 VIEW COMPARISON:  Same-day abdominal radiograph at 0550 hours FINDINGS: Enteric tube tip overlies expected location of the gastric antrum. Multiple gas-filled dilated loops of colon, similar to earlier radiograph. Mild-to-moderate stool burden within the ascending colon. No definite dilated small bowel loops. No free intraperitoneal air given the limits of the supine image. Partially visualized right hip arthroplasty. IMPRESSION: Stable position of the enteric tube, overlying the expected location of the gastric antrum. Unchanged exam compared to earlier same day radiograph with persistent diffuse gaseous dilatation of the colon. Electronically Signed   By: Ileana Roup M.D.   On: 12/30/2020 15:34   DG Swallowing Func-Speech Pathology  Result Date: 01/01/2021 Table formatting from the original result was not included. Objective Swallowing Evaluation: Type of Study: MBS-Modified Barium Swallow Study  Patient Details Name: DELOY ARCHEY MRN: [id number] Date of Birth: [birth year] Today's Date: 01/01/2021 Time: SLP Start Time (ACUTE ONLY): 0827 -SLP Stop Time (ACUTE ONLY): 4765 SLP Time Calculation (min) (ACUTE ONLY): 14 min Past Medical History: Past Medical History: Diagnosis Date  Acute rheumatic heart disease, unspecified   childhood, age  30 & 89  Acute rheumatic pericarditis    Atrial fibrillation ([place])   history  CHF (congestive heart failure) ([place])   Diverticulosis   Dysrhythmia   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  External hemorrhoids without mention of complication   H/O aortic valve replacement   H/O mitral valve replacement   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  Lesion of ulnar nerve   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   Other and unspecified hyperlipidemia   Pre-diabetes   Previous back surgery 1978, jan 2007  Psychosexual dysfunction with inhibited sexual excitement  SOB (shortness of breath)   "with heavy exercise"  Stroke ([place]) 08/2013  "I WAS IN AFIB AND THREW A CLOT, THE EFFECTS WERE TRANSITORY AND DIDNT LAST BUT FOR 30 MINUTES"   Thoracic aortic aneurysm  Past Surgical History: Past Surgical History: Procedure Laterality Date  AORTIC AND MITRAL VALVE REPLACEMENT    09/2004  CARDIOVERSION    3 times from 2004-2006  CARDIOVERSION N/A 09/26/2013  Procedure: CARDIOVERSION;  Surgeon: Larey Dresser, MD;  Location: [place];  Service: Cardiovascular;  Laterality: N/A;  CARDIOVERSION N/A 06/19/2014  Procedure: CARDIOVERSION;  Surgeon: Jerline Pain, MD;  Location: [place];  Service: Cardiovascular;  Laterality: N/A;  CARDIOVERSION N/A 04/24/2017  Procedure: CARDIOVERSION;  Surgeon: Larey Dresser, MD;  Location: [place];  Service: Cardiovascular;  Laterality: N/A;  CARDIOVERSION N/A 06/08/2017  Procedure: CARDIOVERSION;  Surgeon: Pixie Casino, MD;  Location: [facility] ENDOSCOPY;  Service: Cardiovascular;  Laterality: N/A;  CARDIOVERSION N/A 08/24/2017  Procedure: CARDIOVERSION;  Surgeon: Skeet Latch, MD;  Location: [facility] ENDOSCOPY;  Service: Cardiovascular;  Laterality: N/A;  CARDIOVERSION N/A 02/14/2018  Procedure: CARDIOVERSION;  Surgeon: Larey Dresser, MD;  Location: [facility] ENDOSCOPY;  Service: Cardiovascular;  Laterality: N/A;  COLONOSCOPY    CRANIOTOMY N/A 07/28/2020  Procedure: CRANIOTOMY FOR EVACUATION OF SUBDURAL HEMATOMA;  Surgeon: Eustace Merwin, MD;   Location: [place];  Service: Neurosurgery;  Laterality: N/A;  IR ANGIO VERTEBRAL SEL SUBCLAVIAN INNOMINATE UNI R MOD SED  08/07/2020  IR CT HEAD LTD  08/07/2020  IR PERCUTANEOUS ART THROMBECTOMY/INFUSION INTRACRANIAL INC DIAG ANGIO  08/07/2020  laminectomies    10/1975 and in 03/2005  RADIOLOGY WITH ANESTHESIA N/A 08/07/2020  Procedure: IR WITH ANESTHESIA;  Surgeon: Luanne Bras, MD;  Location: [place];  Service: Radiology;  Laterality: N/A;  TEE WITHOUT CARDIOVERSION N/A 12/18/2020  Procedure: TRANSESOPHAGEAL ECHOCARDIOGRAM (TEE);  Surgeon: Larey Dresser, MD;  Location: [facility] ENDOSCOPY;  Service: Cardiovascular;  Laterality: N/A;  [place] Right 04/03/2018  Procedure: TOTAL HIP ARTHROPLASTY ANTERIOR APPROACH;  Surgeon: Paralee Cancel, MD;  Location: [facility];  Service: Orthopedics;  Laterality: Right;  70 mins HPI: Pt is a 80 y.o. male who was admitted 10/10 for confusion/decreased mentation, and poor intake, Pt found to have enterococcus faecalis bacteremia, encephalopathy, small left cerebellar vermis CVA, constipation with mild illeus which resolved. 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 Pt has a hx of cognitive deficits and dysphagia, with most recent MBS on 09/02/20 and most recent diet recommendations for Dysphagia 1 solids and honey-thick liquids.  Per family, pt has since advanced to mechanical soft solid and NTL.  Cortrak placed 12/18/20.  Subjective: Pt was lethargic Assessment / Plan / Recommendation CHL IP CLINICAL IMPRESSIONS 01/01/2021 Clinical Impression Pt presents with oropharyngeal dysphagia characterized by reduced bolus cohesion, reduced pharyngeal constriction, reduced anterior laryngeal movement and a pharyngeal delay. Brief  oral holding was noted, but with adequate bolus containment. Premature spillage and the pharyngeal delay facilitated inconsistent penetration (PAS 5) with larger boluses of thin liquids via straw; however, no other instances of penetration/aspiration were noted during the study. Prompted coughing was ineffective in expelling the penetrate. Trace residue was noted in the pyriform sinuses and on the posterior pharyngeal wall, but this cleared with a liquid wash and secondary swallows. The barium tablet (taken  whole with thin liquids) became lodged in the vallecule and subsequently in mid thoracic esophagus. However, transport to the esophagus was facilitated with a puree bolus, and esophageal clearance was achieved with a liquid wash. A dysphagia 3 diet with thin liquids is recommended at this time observance of swallowing precautions. SLP will continue to follow pt. SLP Visit Diagnosis Dysphagia, oropharyngeal phase (R13.12) Attention and concentration deficit following -- Frontal lobe and executive function deficit following -- Impact on safety and function Mild aspiration risk   CHL IP TREATMENT RECOMMENDATION 01/01/2021 Treatment Recommendations Therapy as outlined in treatment plan below   Prognosis 01/01/2021 Prognosis for Safe Diet Advancement Fair Barriers to Reach Goals Cognitive deficits Barriers/Prognosis Comment -- CHL IP DIET RECOMMENDATION 01/01/2021 SLP Diet Recommendations Dysphagia 3 (Mech soft) solids;Thin liquid Liquid Administration via Cup;No straw Medication Administration Crushed with puree Compensations Slow rate;Small sips/bites Postural Changes Seated upright at 90 degrees   CHL IP OTHER RECOMMENDATIONS 01/01/2021 Recommended Consults -- Oral Care Recommendations Oral care BID Other Recommendations --   CHL IP FOLLOW UP RECOMMENDATIONS 01/01/2021 Follow up Recommendations 24 hour supervision/assistance;Skilled Nursing facility   [facility] IP FREQUENCY AND DURATION 01/01/2021 Speech Therapy  Frequency (ACUTE ONLY) min 2x/week Treatment Duration 2 weeks      CHL IP ORAL PHASE 01/01/2021 Oral Phase Impaired Oral - Pudding Teaspoon -- Oral - Pudding Cup -- Oral - Honey Teaspoon -- Oral - Honey Cup -- Oral - Nectar Teaspoon -- Oral - Nectar Cup Holding of bolus Oral - Nectar Straw Holding of bolus Oral - Thin Teaspoon -- Oral - Thin Cup Holding of bolus Oral - Thin Straw Holding of bolus;Decreased bolus cohesion;Premature spillage Oral - Puree Holding of bolus Oral - Mech Soft -- Oral - Regular WFL Oral - Multi-Consistency -- Oral - Pill Holding of bolus Oral Phase - Comment --  CHL IP PHARYNGEAL PHASE 01/01/2021 Pharyngeal Phase Impaired Pharyngeal- Pudding Teaspoon -- Pharyngeal -- Pharyngeal- Pudding Cup -- Pharyngeal -- Pharyngeal- Honey Teaspoon -- Pharyngeal -- Pharyngeal- Honey Cup -- Pharyngeal -- Pharyngeal- Nectar Teaspoon -- Pharyngeal -- Pharyngeal- Nectar Cup Pharyngeal residue - pyriform;Reduced anterior laryngeal mobility Pharyngeal -- Pharyngeal- Nectar Straw Pharyngeal residue - pyriform;Reduced anterior laryngeal mobility Pharyngeal -- Pharyngeal- Thin Teaspoon -- Pharyngeal -- Pharyngeal- Thin Cup Pharyngeal residue - pyriform;Reduced anterior laryngeal mobility Pharyngeal -- Pharyngeal- Thin Straw Pharyngeal residue - pyriform;Reduced anterior laryngeal mobility;Penetration/Aspiration during swallow Pharyngeal Material enters airway, CONTACTS cords and not ejected out Pharyngeal- Puree Pharyngeal residue - pyriform;Reduced anterior laryngeal mobility Pharyngeal -- Pharyngeal- Mechanical Soft -- Pharyngeal -- Pharyngeal- Regular Pharyngeal residue - pyriform;Reduced anterior laryngeal mobility Pharyngeal -- Pharyngeal- Multi-consistency -- Pharyngeal -- Pharyngeal- Pill -- Pharyngeal -- Pharyngeal Comment --  CHL IP CERVICAL ESOPHAGEAL PHASE 01/01/2021 Cervical Esophageal Phase WFL Pudding Teaspoon -- Pudding Cup -- Honey Teaspoon -- Honey Cup -- Nectar Teaspoon -- Nectar Cup -- Nectar  Straw -- Thin Teaspoon -- Thin Cup -- Thin Straw -- Puree -- Mechanical Soft -- Regular -- Multi-consistency -- Pill -- Cervical Esophageal Comment -- Shanika I. Hardin Negus, [place], [place] Office number [phone] Pager [place] 01/01/2021, 9:36 AM              [place] EKG SITE RITE  Result Date: 01/01/2021 If Site Rite image not attached, placement could not be confirmed due to current cardiac rhythm.    LOS: 17 days   Signature  Lala Lund M.D on 01/01/2021 at 12:09 PM   -  To page go to [email or web address]

## 2021-01-01 NOTE — Progress Notes (Signed)
Speech Language Pathology Treatment: Dysphagia  Patient Details Name: [name] [name] MRN: [id number] DOB: 1940/08/15 Today's Date: 01/01/2021 Time: 2263-3354 SLP Time Calculation (min) (ACUTE ONLY): 30 min  Assessment / Plan / Recommendation Clinical Impression  [name] [name] was seen for dysphagia treatment with his wife and son present. [name] [name] and his family were educated regarding the results of the modified barium swallow study, diet recommendations, and swallowing precautions. Video recording of the study was used to facilitate education and [name] [name]'s family verbalized understanding regarding all areas of education. [name] [name]'s family was educated regarding the need for oral care to reduce oral pathogens and to reduce risk of aspiration-related precautions. All of their questions were answered to their satisfaction and [name] [name]'s wife was observed to implement swallowing precautions during the breakfast meal. Oral care was provided by this SLP and [name] [name] consumed a meal of applesauce, sausage, scrambled eggs, and thin liquids via cup. No s/sx of aspiration were demonstrated. Oral holding was noted and mastication was mildly prolonged with meats, but functional. [name] [name]'s diet of dysphagia 3 solids and thin liquids will be continued at this time. SLP will continue to follow [name] [name] and SLP services are recommended following discharge.   HPI HPI: [name] [name] is a 80 y.o. male who was admitted 10/10 for confusion/decreased mentation, and poor intake, [name] [name] found to have enterococcus faecalis bacteremia, encephalopathy, small left cerebellar vermis CVA, constipation with mild illeus which resolved. 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 [name] [name] has a hx of cognitive deficits and dysphagia, with most recent MBS on 09/02/20 and  most recent diet recommendations for Dysphagia 1 solids and honey-thick liquids.  Per family, [name] [name] has since advanced to mechanical soft solid and NTL.  Cortrak placed 12/18/20.      SLP Plan  Continue with current plan of care      Recommendations for follow up therapy are one component of a multi-disciplinary discharge planning process, led by the attending physician.  Recommendations may be updated based on patient status, additional functional criteria and insurance authorization.    Recommendations  Diet recommendations: Dysphagia 3 (mechanical soft);Thin liquid Liquids provided via: Cup;Teaspoon;No straw Medication Administration: Crushed with puree Supervision: Trained caregiver to feed patient Compensations: Slow rate;Small sips/bites Postural Changes and/or Swallow Maneuvers: Seated upright 90 degrees                Oral Care Recommendations: Oral care BID;Staff/trained caregiver to provide oral care Follow up Recommendations: 24 hour supervision/assistance;Skilled Nursing facility SLP Visit Diagnosis: Dysphagia, oropharyngeal phase (R13.12) Plan: Continue with current plan of care       Shemica Meath I. Hardin Negus, [place], [place] Office number [phone] Pager [place]  01/01/2021, 10:31 AM

## 2021-01-02 DIAGNOSIS — R400 Somnolence: Secondary | ICD-10-CM | POA: Diagnosis not present

## 2021-01-02 LAB — CBC
HCT: 25 % — ABNORMAL LOW (ref 39.0–52.0)
Hemoglobin: 7.9 g/dL — ABNORMAL LOW (ref 13.0–17.0)
MCH: 29.3 pg (ref 26.0–34.0)
MCHC: 31.6 g/dL (ref 30.0–36.0)
MCV: 92.6 fL (ref 80.0–100.0)
Platelets: 254 10*3/uL (ref 150–400)
RBC: 2.7 MIL/uL — ABNORMAL LOW (ref 4.22–5.81)
RDW: 17.8 % — ABNORMAL HIGH (ref 11.5–15.5)
WBC: 10.3 10*3/uL (ref 4.0–10.5)
nRBC: 0 % (ref 0.0–0.2)

## 2021-01-02 LAB — GLUCOSE, CAPILLARY
Glucose-Capillary: 99 mg/dL (ref 70–99)
Glucose-Capillary: 111 mg/dL — ABNORMAL HIGH (ref 70–99)
Glucose-Capillary: 234 mg/dL — ABNORMAL HIGH (ref 70–99)
Glucose-Capillary: 120 mg/dL — ABNORMAL HIGH (ref 70–99)

## 2021-01-02 LAB — MAGNESIUM: Magnesium: 2 mg/dL (ref 1.7–2.4)

## 2021-01-02 LAB — COMPREHENSIVE METABOLIC PANEL
ALT: 77 U/L — ABNORMAL HIGH (ref 0–44)
AST: 52 U/L — ABNORMAL HIGH (ref 15–41)
Albumin: 2 g/dL — ABNORMAL LOW (ref 3.5–5.0)
Alkaline Phosphatase: 105 U/L (ref 38–126)
Anion gap: 8 (ref 5–15)
BUN: 15 mg/dL (ref 8–23)
CO2: 23 mmol/L (ref 22–32)
Calcium: 8.4 mg/dL — ABNORMAL LOW (ref 8.9–10.3)
Chloride: 106 mmol/L (ref 98–111)
Creatinine, Ser: 0.88 mg/dL (ref 0.61–1.24)
GFR, Estimated: >60 mL/min (ref >60)
Glucose, Bld: 122 mg/dL — ABNORMAL HIGH (ref 70–99)
Potassium: 4.3 mmol/L (ref 3.5–5.1)
Sodium: 137 mmol/L (ref 135–145)
Total Bilirubin: 0.7 mg/dL (ref 0.3–1.2)
Total Protein: 6.4 g/dL — ABNORMAL LOW (ref 6.5–8.1)

## 2021-01-02 LAB — PROCALCITONIN: Procalcitonin: <0.1 ng/mL

## 2021-01-02 LAB — BRAIN NATRIURETIC PEPTIDE: B Natriuretic Peptide: 229.4 pg/mL — ABNORMAL HIGH (ref 0.0–100.0)

## 2021-01-02 MED ORDER — BISACODYL 10 MG RE SUPP
10.0000 mg | Freq: Once | RECTAL | Status: DC
Start: 2021-01-02 — End: 2021-01-05

## 2021-01-02 MED ORDER — BISACODYL 10 MG RE SUPP
10.0000 mg | RECTAL | Status: DC
  Filled 2021-01-02: qty 1

## 2021-01-02 MED ORDER — LACTATED RINGERS IV SOLN: INTRAVENOUS | Status: AC

## 2021-01-02 NOTE — Progress Notes (Signed)
Peripherally Inserted Central Catheter Placement  The IV Nurse has discussed with the patient and/or persons authorized to consent for the patient, the purpose of this procedure and the potential benefits and risks involved with this procedure.  The benefits include less needle sticks, lab draws from the catheter, and the patient may be discharged home with the catheter. Risks include, but not limited to, infection, bleeding, blood clot (thrombus formation), and puncture of an artery; nerve damage and irregular heartbeat and possibility to perform a PICC exchange if needed/ordered by physician.  Alternatives to this procedure were also discussed.  B[employer] Power PICC patient education guide, fact sheet on infection prevention and patient information card has been provided to patient /or left at bedside.  Consent signed by wife at bedside.  Son also at bedside agreeable.  Notified PICC should be placed today or tomorrow.  PICC Placement Documentation        Rolena Infante 01/02/2021, 12:52 PM

## 2021-01-02 NOTE — Plan of Care (Signed)
  Problem: Education: Goal: Knowledge of General Education information will improve Description Including pain rating scale, medication(s)/side effects and non-pharmacologic comfort measures Outcome: Progressing   Problem: Health Behavior/Discharge Planning: Goal: Ability to manage health-related needs will improve Outcome: Progressing   

## 2021-01-02 NOTE — Progress Notes (Signed)
PROGRESS NOTE        PATIENT DETAILS Name: [name] [name] Age: 80 y.o. Sex: male Date of Birth: 1940-10-17 Admit Date: 12/14/2020 Admitting Physician Georgette Shell, MD JJO:ACZYS, Arvid Right, MD  Brief Narrative: Patient is a 80 y.o. 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  Hospital course complicated by prolonged encephalopathy. Thankully starting on 10/19-encephalopathy has started to improve.  See below for further details.   Pertinent Labs/Radiology:  Hb: 7.9 Na: 136 K: 3.9  creatinine: 0.74  AST/ALT: 69/127  Microbiology:  10/10>>Blood culture: Enterococcus Enterococcus faecalis 10/10>> urine culture: Staff aureus 10/11>> blood culture: 1/2-Enterococcus faecalis 10/13>> blood culture: 1/2-Enterococcus faecalis 10/15>> blood culture: Staff epidermidis. 10/17>> blood culture: No growth  Radiology/Echo/EEG:  10/10>> CT head: Small right subdural hematoma 10/11>> TTE: EF-65-70%, no obvious vegetation. 10/13-10/14>> LTM EEG: No seizures. 10/14>> TEE: Prosthetic mitral valve vegetation 10/14>> MRI brain: Acute infarct left cerebellar vermis, small subacute hematoma in the right frontal lobe.   Subjective:   Patient in bed, appears comfortable, denies any headache, no fever, no chest pain or pressure, no shortness of breath , no abdominal pain. No new focal weakness.   Objective: Vitals: Blood pressure (!) 148/72, pulse 76, temperature 98.3 F (36.8 C),  temperature source Oral, resp. rate 20, height 5\' 7"  (1.702 m), weight 82.7 kg, SpO2 94 %.   Exam:  Awake, weak, L sided weakness 0/5, Cor track in place [place].AT,PERRAL Supple Neck, No JVD,   Symmetrical Chest wall movement, Good air movement bilaterally, CTAB RRR,No Gallops, Rubs or new Murmurs,  +ve B.Sounds, Abd Soft, No tenderness,   No Cyanosis, Clubbing or edema,     Assessment/Plan:  Acute metabolic encephalopathy: Due to hypernatremia/enterococcal bacteremia-had prolonged encephalopathy-but since 10/19-gradually improving.  Gradual but consistent improvement in mental status.  Remains on amantadine 200 mg twice daily for arousal.   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  Repeat blood culture on 10/17 negative so far.  Now on Rocephin/ampicillin-with stop date of 01/28/2021 >> then amoxicillin 500 mg twice daily indefinitely.  Plan to place PICC line sometime next week prior to discharge.  Right wrist tenderness: Unclear etiology-no evidence of erythema swelling on exam.  Uric acid levels unremarkable.  X-ray suggestive of arthritis-better with supportive care and application of Voltaren gel.  Watch and follow.   Transaminitis: Suspect this is multifactorial from statin/Rocephin/underlying bacteremia.  Abdomen is soft.  Statin discontinued on 10/19-LFTs have started to downtrend.  Resume statin 01/01/21    Normocytic anemia: Due to acute illness-no evidence of blood loss-watch/follow CBC closely.  PAF: Continue Tikosyn (pharmacy dosing)- initially on Eliquis-has been switched to IV heparin-if he remains clinically stable Eliquis in a few days.  Acute left cerebellar vermis CVA: Felt to be a septic emboli in the setting of mitral valve endocarditis.     History of seizure disorder: Initially on Keppra-given lethargy/encephalopathy - switched  to Vimpat on 10/16.  LTM EEG negative for seizures.    Recent SDH May 2022: This appears stable on recent imaging.  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 Continues to have significant left-sided hemiparesis. On therapeutic anticoagulation with Eliquis.  History of rheumatic heart disease-s/p remote AVR/MVR (bioprosthetic valve)  Severe failure to thrive syndrome/frailty/poor oral intake/goals of care: Encephalopathy/failure to thrive syndrome have markedly improved-he is now starting to consume more oral intake.  Core track has been clamped off since 12/28/2020 dietitian following calorie count stable with oral intake, DC Core track on 01/01/21.  Constipation with Mild Ileus - placed on B.regimen, resolved.  Frequent bouts of dehydration.  Gentle IV fluids on a as needed basis once or twice a week, monitor urine color and serum electrolytes once a week at SNF.    Goals of care: Long discussion with spouse/2 sons on 10/20 by this MD, goals of care as follows.  1.DNR  2.Family fully well aware that it is unlikely that after this acute illness-patient will achieve his prior level of functioning/mental acuity.  However, if he is able to interact and enjoy some quality time with family/grandkids (even in a wheelchair)-they feel that this is something that the patient would want. 3.Calorie count over the weekend-if mental/clinical status continues to improve-okay to proceed with PEG tube placement if felt to be needed. 4.Apart from PEG tube placement-no further aggressive care.  Family aware that patient is not a candidate for redo valve replacement surgery etc.   5.Family wants to give it several weeks-to allow for further mental status/functional improvement.Family okay with being  discharged to SNF with peg tube (if needed)/PICC line-and palliative care follow-up.   6.If while at SNF-if felt to have no significant improvement in quality of life or if he suffers a significant decompensation-family willing to consider transitioning to comfort measures then.  7.They would like treatment with antibiotics/IV fluids/hospitalization for mild to moderate infection/electrolyte disorders. 8. Family aware that if in the interim (while hospitalized)-patient has significant decline/acute decompensation-organ damage/ICU level of care-family would be okay to transitioning to comfort measures in that event.    Nutrition Status: Nutrition Problem: Severe Malnutrition Etiology: chronic illness (CHF, Stroke) Signs/Symptoms: percent weight loss, moderate fat depletion, severe muscle depletion Percent weight loss: 12 % Interventions: MVI, Hormel Shake    Procedures: None Consults: Cardiology, neurology, infectious disease DVT Prophylaxis: Eliquis >> Hep gtt Code Status:DNR Family Communication: [name] at bedside 12/27/20, 12/28/20, 12/29/20, 12/30/20, 12/31/20, 01/01/21, 01/02/2021 with son along with her,  Time spent: 22 minutes-Greater than 50% of this time was spent in counseling, explanation of diagnosis, planning of further management, and coordination of care.  Diet: Diet Order             DIET DYS 3 Room service appropriate? Yes; Fluid consistency: Thin; Fluid restriction: 1800 mL Fluid  Diet effective now                      Disposition Plan: Status is: Inpatient  Remains inpatient appropriate because:Inpatient level of care appropriate due to severity of illness  Dispo: The patient is from: SNF              Anticipated d/c is to: SNF              Patient currently is not medically stable to d/c.   Difficult to place patient No  Barriers to Discharge: Persistent encephalopathy-persistent bacteremia with Enterococcus-on IV antibiotics-NG tube  feedings ongoing due to poor oral intake.  Antimicrobial agents: Anti-infectives (From admission, onward)    Start     Dose/Rate Route Frequency Ordered Stop   12/25/20 1600  ampicillin (OMNIPEN) 2 g in sodium chloride 0.9 % 100 mL IVPB        2 g 300 mL/hr over 20 Minutes Intravenous Every 4 hours 12/25/20 1227 01/28/21 2359   12/18/20 1500  cefTRIAXone (ROCEPHIN) 2 g in sodium chloride 0.9 % 100 mL IVPB        2 g 200 mL/hr over 30 Minutes Intravenous Every 12 hours 12/18/20 1344 01/28/21 2359   12/15/20 1700  cefTRIAXone (ROCEPHIN) 1 g in sodium chloride 0.9 % 100 mL IVPB  Status:  Discontinued        1 g 200 mL/hr over 30 Minutes Intravenous Every 24 hours 12/14/20 1648 12/15/20 0624   12/15/20 0630  ampicillin (OMNIPEN) 2 g in sodium chloride 0.9 % 100 mL IVPB  Status:  Discontinued        2 g 300 mL/hr over 20 Minutes Intravenous Every 6 hours 12/15/20 0624 12/25/20 1227   12/15/20 0630  gentamicin (GARAMYCIN) IVPB 80 mg        80 mg 100 mL/hr over 30 Minutes Intravenous  Once 12/15/20 0628 12/15/20 0732   12/14/20 1645  cefTRIAXone (ROCEPHIN) 1 g in sodium chloride 0.9 % 100 mL IVPB        1 g 200 mL/hr over 30 Minutes Intravenous  Once 12/14/20 1642 12/14/20 1830        MEDICATIONS: Scheduled Meds:  amantadine  200 mg Oral BID   apixaban  5 mg Oral BID   atorvastatin  80 mg Oral Daily   bisacodyl  10 mg Rectal Once   [START ON 01/04/2021] bisacodyl  10 mg Rectal QODAY   diclofenac Sodium  4 g Topical QID   docusate sodium  100 mg Oral BID   dofetilide  375 mcg Oral BID   doxazosin  1 mg Oral Daily   famotidine  20 mg Oral Daily   finasteride  5 mg Oral Daily   fluocinonide cream   Topical BID   insulin aspart  0-9 Units Subcutaneous TID WC   lacosamide  50 mg Oral BID   magnesium gluconate  500 mg Oral Daily   mouth rinse  15 mL Mouth Rinse BID   multivitamin with minerals  1 tablet Oral Daily   Continuous Infusions:  ampicillin (OMNIPEN) IV 2 g (01/02/21  1003)   cefTRIAXone (ROCEPHIN)  IV 2 g (01/01/21 2057)   lactated ringers     PRN Meds:.ondansetron (ZOFRAN) IV   I have personally reviewed following labs and imaging studies  LABORATORY DATA:  Recent Labs  Lab 12/29/20 0407 12/30/20 0211 12/31/20 0101 01/01/21 0042 01/02/21 0240  WBC 12.4* 14.3* 13.0* 10.5 10.3  HGB 7.5* 8.5* 8.2* 7.6* 7.9*  HCT 23.8* 26.8* 25.7* 24.4* 25.0*  PLT 243 268 268 252 254  MCV 89.1 90.2 89.9 91.4 92.6  MCH 28.1 28.6 28.7 28.5 29.3  MCHC 31.5 31.7 31.9 31.1 31.6  RDW 17.2* 17.8* 17.9* 18.0* 17.8*    Recent Labs  Lab 12/29/20 0407 12/30/20 0211 12/31/20 0101 01/01/21 0042 01/01/21 1817 01/02/21 0240  NA 135 135 136 138 138 137  K 3.8 3.9 4.2 4.2 3.8 4.3  CL 104 103 105 106 109 106  CO2 24 22 25 26 22  23  GLUCOSE 103* 126* 149* 104* 135* 122*  BUN 17 17 14 12 13 15   CREATININE 0.82 0.86 0.83 0.84 0.84 0.88  CALCIUM 7.7* 8.3* 8.4* 8.3* 8.2* 8.4*  AST 41 47* 45* 37  --  52*  ALT 80* 89* 80* 68*  --  77*  ALKPHOS 87 109 94 88  --  105  BILITOT 0.6 0.5 0.3 0.5  --  0.7  ALBUMIN 1.7* 1.9* 1.8* 1.9*  --  2.0*  MG 2.0 1.9 2.2 2.1 2.1 2.0  PROCALCITON 0.13 <0.10 0.10 <0.10  --  <0.10  BNP 207.1* 225.4* 277.2* 148.8*  --  229.4*     Urine analysis:    Component Value Date/Time   COLORURINE YELLOW 12/14/2020 1600   APPEARANCEUR CLOUDY (A) 12/14/2020 1600   LABSPEC 1.020 12/14/2020 1600   PHURINE 6.0 12/14/2020 1600   GLUCOSEU NEGATIVE 12/14/2020 1600   HGBUR LARGE (A) 12/14/2020 1600   BILIRUBINUR NEGATIVE 12/14/2020 1600   BILIRUBINUR negative 11/22/2013 0848   KETONESUR NEGATIVE 12/14/2020 1600   PROTEINUR NEGATIVE 12/14/2020 1600   UROBILINOGEN 0.2 11/22/2013 0848   UROBILINOGEN 1.0 08/05/2013 2207   NITRITE POSITIVE (A) 12/14/2020 1600   LEUKOCYTESUR LARGE (A) 12/14/2020 1600    Sepsis Labs: Lactic Acid, Venous    Component Value Date/Time   LATICACIDVEN 1.8 12/14/2020 2056     RADIOLOGY STUDIES/RESULTS: DG  Swallowing Func-Speech Pathology  Result Date: 01/01/2021 Table formatting from the original result was not included. 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  External hemorrhoids without mention of complication   H/O aortic valve replacement   H/O mitral valve replacement   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  Lesion of ulnar nerve   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   Other and unspecified hyperlipidemia   Pre-diabetes   Previous back surgery 1978, jan 2007  Psychosexual dysfunction with inhibited sexual excitement   SOB (shortness of breath)   "with heavy exercise"  Stroke ([place]) 08/2013  "I WAS IN AFIB AND THREW A CLOT, THE EFFECTS WERE TRANSITORY AND DIDNT LAST BUT FOR 30 MINUTES"   Thoracic aortic aneurysm  Past Surgical History: Past Surgical History: Procedure Laterality Date  AORTIC AND MITRAL VALVE REPLACEMENT    09/2004  CARDIOVERSION    3 times from 2004-2006  CARDIOVERSION N/A 09/26/2013  Procedure: CARDIOVERSION;  Surgeon: Larey Dresser, MD;  Location: [facility];  Service: Cardiovascular;  Laterality: N/A;  CARDIOVERSION N/A 06/19/2014  Procedure: CARDIOVERSION;  Surgeon: Jerline Pain, MD;  Location: [place];  Service: Cardiovascular;  Laterality: N/A;  CARDIOVERSION N/A 04/24/2017  Procedure: CARDIOVERSION;  Surgeon: Larey Dresser, MD;  Location: [place];  Service: Cardiovascular;  Laterality: N/A;  CARDIOVERSION  N/A 06/08/2017  Procedure: CARDIOVERSION;  Surgeon: Pixie Casino, MD;  Location: [facility] ENDOSCOPY;  Service: Cardiovascular;  Laterality: N/A;  CARDIOVERSION N/A 08/24/2017  Procedure: CARDIOVERSION;  Surgeon: Skeet Latch, MD;  Location: [place];  Service: Cardiovascular;  Laterality: N/A;  CARDIOVERSION N/A 02/14/2018  Procedure: CARDIOVERSION;  Surgeon: Larey Dresser, MD;  Location: [facility] ENDOSCOPY;  Service: Cardiovascular;  Laterality: N/A;  COLONOSCOPY    CRANIOTOMY N/A 07/28/2020  Procedure: CRANIOTOMY FOR EVACUATION OF SUBDURAL HEMATOMA;  Surgeon: Eustace Lusby, MD;  Location: [place];  Service: Neurosurgery;  Laterality: N/A;  IR ANGIO VERTEBRAL SEL SUBCLAVIAN INNOMINATE UNI R MOD SED  08/07/2020  IR CT HEAD LTD  08/07/2020  IR PERCUTANEOUS ART THROMBECTOMY/INFUSION INTRACRANIAL INC DIAG ANGIO  08/07/2020  laminectomies    10/1975 and in 03/2005  RADIOLOGY WITH ANESTHESIA N/A 08/07/2020  Procedure: IR WITH ANESTHESIA;  Surgeon: Luanne Bras, MD;  Location: [place];  Service: Radiology;  Laterality: N/A;  TEE WITHOUT CARDIOVERSION N/A 12/18/2020  Procedure: TRANSESOPHAGEAL ECHOCARDIOGRAM (TEE);  Surgeon: Larey Dresser, MD;  Location: [facility] ENDOSCOPY;  Service: Cardiovascular;  Laterality: N/A;  [place] Right 04/03/2018  Procedure: TOTAL HIP ARTHROPLASTY ANTERIOR APPROACH;  Surgeon: Paralee Cancel, MD;  Location: [facility];  Service: Orthopedics;  Laterality: Right;  70 mins HPI: Pt is a 80 y.o. male who was admitted 10/10 for confusion/decreased mentation, and poor intake, Pt found to have enterococcus faecalis bacteremia, encephalopathy, small left cerebellar vermis CVA, constipation with mild illeus which resolved. 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 PMH: rheumatic heart disease s/p distant aortic/mv replacement, chronic afib, on anticoagulation, CHF, recent traumatic SDH 07/2020 s/p right craniotomy, right MCA stroke 08/2020 with left paresis, skilled  nursing home resident. Pt has a hx of cognitive deficits and dysphagia, with most recent MBS on 09/02/20 and most recent diet recommendations for Dysphagia 1 solids and honey-thick liquids.  Per family, pt has since advanced to mechanical soft solid and NTL.  Cortrak placed 12/18/20.  Subjective: Pt was lethargic Assessment / Plan / Recommendation CHL IP CLINICAL IMPRESSIONS 01/01/2021 Clinical Impression Pt presents with oropharyngeal dysphagia characterized by reduced bolus cohesion, reduced pharyngeal constriction, reduced anterior laryngeal movement and a pharyngeal delay. Brief oral holding was noted, but with adequate bolus containment. Premature spillage and the pharyngeal delay facilitated inconsistent penetration (PAS 5) with larger boluses of thin liquids via straw; however, no other instances of penetration/aspiration were noted during the study. Prompted coughing was ineffective in expelling the penetrate. Trace residue was noted in the pyriform sinuses and on the posterior pharyngeal wall, but this cleared with a liquid wash and secondary swallows. The barium tablet (taken whole with thin liquids) became lodged in the vallecule and subsequently in mid thoracic esophagus. However, transport to the esophagus was facilitated with a puree bolus, and esophageal clearance was achieved with a liquid wash. A dysphagia 3 diet with thin liquids is recommended at this time observance of swallowing precautions. SLP will continue to follow pt. SLP Visit Diagnosis Dysphagia, oropharyngeal phase (R13.12) Attention and concentration deficit following -- Frontal lobe and executive function deficit following -- Impact on safety and function Mild aspiration risk   CHL IP TREATMENT RECOMMENDATION 01/01/2021 Treatment Recommendations Therapy as outlined in treatment plan below   Prognosis 01/01/2021 Prognosis for Safe Diet Advancement Fair Barriers to Reach Goals Cognitive deficits Barriers/Prognosis Comment -- CHL IP DIET  RECOMMENDATION 01/01/2021 SLP Diet Recommendations Dysphagia 3 (Mech soft) solids;Thin liquid Liquid Administration via Cup;No straw Medication Administration Crushed with puree Compensations Slow rate;Small sips/bites Postural Changes Seated upright at 90 degrees   CHL IP OTHER RECOMMENDATIONS 01/01/2021 Recommended Consults -- Oral Care Recommendations Oral care BID Other Recommendations --  CHL IP FOLLOW UP RECOMMENDATIONS 01/01/2021 Follow up Recommendations 24 hour supervision/assistance;Skilled Nursing facility   [facility] IP FREQUENCY AND DURATION 01/01/2021 Speech Therapy Frequency (ACUTE ONLY) min 2x/week Treatment Duration 2 weeks      CHL IP ORAL PHASE 01/01/2021 Oral Phase Impaired Oral - Pudding Teaspoon -- Oral - Pudding Cup -- Oral - Honey Teaspoon -- Oral - Honey Cup -- Oral - Nectar Teaspoon -- Oral - Nectar Cup Holding of bolus Oral - Nectar Straw Holding of bolus Oral - Thin Teaspoon -- Oral - Thin Cup Holding of bolus Oral - Thin Straw Holding of bolus;Decreased bolus cohesion;Premature spillage Oral - Puree Holding of bolus Oral - Mech Soft -- Oral - Regular WFL Oral - Multi-Consistency -- Oral - Pill Holding of bolus Oral Phase - Comment --  CHL IP PHARYNGEAL PHASE 01/01/2021 Pharyngeal Phase Impaired Pharyngeal- Pudding Teaspoon -- Pharyngeal -- Pharyngeal- Pudding Cup -- Pharyngeal -- Pharyngeal- Honey Teaspoon -- Pharyngeal -- Pharyngeal- Honey Cup -- Pharyngeal -- Pharyngeal- Nectar Teaspoon -- Pharyngeal -- Pharyngeal- Nectar Cup Pharyngeal residue - pyriform;Reduced anterior laryngeal mobility Pharyngeal -- Pharyngeal- Nectar Straw Pharyngeal residue - pyriform;Reduced anterior laryngeal mobility Pharyngeal -- Pharyngeal- Thin Teaspoon -- Pharyngeal -- Pharyngeal- Thin Cup Pharyngeal residue - pyriform;Reduced anterior laryngeal mobility Pharyngeal -- Pharyngeal- Thin Straw Pharyngeal residue - pyriform;Reduced anterior laryngeal mobility;Penetration/Aspiration during swallow Pharyngeal  Material enters airway, CONTACTS cords and not ejected out Pharyngeal- Puree Pharyngeal residue - pyriform;Reduced anterior laryngeal mobility Pharyngeal -- Pharyngeal- Mechanical Soft -- Pharyngeal -- Pharyngeal- Regular Pharyngeal residue - pyriform;Reduced anterior laryngeal mobility Pharyngeal -- Pharyngeal- Multi-consistency -- Pharyngeal -- Pharyngeal- Pill -- Pharyngeal -- Pharyngeal Comment --  CHL IP CERVICAL ESOPHAGEAL PHASE 01/01/2021 Cervical Esophageal Phase WFL Pudding Teaspoon -- Pudding Cup -- Honey Teaspoon -- Honey Cup -- Nectar Teaspoon -- Nectar Cup -- Nectar Straw -- Thin Teaspoon -- Thin Cup -- Thin Straw -- Puree -- Mechanical Soft -- Regular -- Multi-consistency -- Pill -- Cervical Esophageal Comment -- Shanika I. Hardin Negus, [place], [place] Office number [phone] Pager [place] 01/01/2021, 9:36 AM              [place] EKG SITE RITE  Result Date: 01/01/2021 If Site Rite image not attached, placement could not be confirmed due to current cardiac rhythm.    LOS: 18 days   Signature  Lala Lund M.D on 01/02/2021 at 10:47 AM   -  To page go to [email or web address]

## 2021-01-03 DIAGNOSIS — R400 Somnolence: Secondary | ICD-10-CM | POA: Diagnosis not present

## 2021-01-03 LAB — COMPREHENSIVE METABOLIC PANEL
ALT: 63 U/L — ABNORMAL HIGH (ref 0–44)
AST: 32 U/L (ref 15–41)
Albumin: 1.9 g/dL — ABNORMAL LOW (ref 3.5–5.0)
Alkaline Phosphatase: 94 U/L (ref 38–126)
Anion gap: 6 (ref 5–15)
BUN: 17 mg/dL (ref 8–23)
CO2: 23 mmol/L (ref 22–32)
Calcium: 8.3 mg/dL — ABNORMAL LOW (ref 8.9–10.3)
Chloride: 109 mmol/L (ref 98–111)
Creatinine, Ser: 0.93 mg/dL (ref 0.61–1.24)
GFR, Estimated: >60 mL/min (ref >60)
Glucose, Bld: 100 mg/dL — ABNORMAL HIGH (ref 70–99)
Potassium: 3.7 mmol/L (ref 3.5–5.1)
Sodium: 138 mmol/L (ref 135–145)
Total Bilirubin: 0.4 mg/dL (ref 0.3–1.2)
Total Protein: 6.3 g/dL — ABNORMAL LOW (ref 6.5–8.1)

## 2021-01-03 LAB — MAGNESIUM: Magnesium: 2 mg/dL (ref 1.7–2.4)

## 2021-01-03 LAB — GLUCOSE, CAPILLARY
Glucose-Capillary: 80 mg/dL (ref 70–99)
Glucose-Capillary: 113 mg/dL — ABNORMAL HIGH (ref 70–99)
Glucose-Capillary: 106 mg/dL — ABNORMAL HIGH (ref 70–99)
Glucose-Capillary: 150 mg/dL — ABNORMAL HIGH (ref 70–99)

## 2021-01-03 MED ORDER — DOCUSATE SODIUM 50 MG/5ML PO LIQD
100.0000 mg | Freq: Two times a day (BID) | ORAL | Status: DC
  Administered 2021-01-03 – 2021-01-05 (×5): 100 mg via ORAL
  Filled 2021-01-03 (×5): qty 10

## 2021-01-03 MED ORDER — SODIUM CHLORIDE 0.9% FLUSH: 10.0000 mL | INTRAVENOUS | Status: DC | PRN

## 2021-01-03 MED ORDER — POTASSIUM CHLORIDE CRYS ER 20 MEQ PO TBCR
40.0000 meq | EXTENDED_RELEASE_TABLET | Freq: Once | ORAL | Status: AC
  Administered 2021-01-03: 40 meq via ORAL
  Filled 2021-01-03: qty 2

## 2021-01-03 MED ORDER — CHLORHEXIDINE GLUCONATE CLOTH 2 % EX PADS
6.0000 | MEDICATED_PAD | Freq: Every day | CUTANEOUS | Status: DC
  Administered 2021-01-03 – 2021-01-05 (×3): 6 via TOPICAL

## 2021-01-03 MED ORDER — SODIUM CHLORIDE 0.9% FLUSH
10.0000 mL | Freq: Two times a day (BID) | INTRAVENOUS | Status: DC
  Administered 2021-01-03 – 2021-01-05 (×4): 10 mL

## 2021-01-03 NOTE — Progress Notes (Signed)
Peripherally Inserted Central Catheter Placement  The IV Nurse has discussed with the patient and/or persons authorized to consent for the patient, the purpose of this procedure and the potential benefits and risks involved with this procedure.  The benefits include less needle sticks, lab draws from the catheter, and the patient may be discharged home with the catheter. Risks include, but not limited to, infection, bleeding, blood clot (thrombus formation), and puncture of an artery; nerve damage and irregular heartbeat and possibility to perform a PICC exchange if needed/ordered by physician.  Alternatives to this procedure were also discussed.  Bard Power PICC patient education guide, fact sheet on infection prevention and patient information card has been provided to patient /or left at bedside. Obtained consent via wife.  PICC Placement Documentation  PICC Single Lumen 01/03/21 Right Brachial 0 cm (Active)  Indication for Insertion or Continuance of Line Home intravenous therapies (PICC only) 01/03/21 1141  Exposed Catheter (cm) 0 cm 01/03/21 1141  Site Assessment Clean;Dry;Intact;Bleeding 01/03/21 1141  Line Status Flushed;Saline locked;Blood return noted 01/03/21 1141  Dressing Type Transparent;Securing device 01/03/21 1141  Dressing Status Clean;Dry;Intact 01/03/21 1141  Antimicrobial disc in place? Yes 01/03/21 1141  Safety Lock Not Applicable 81/85/63 1497  Line Care Tubing changed;Connections checked and tightened 01/03/21 1141  Line Adjustment (NICU/IV Team Only) No 01/03/21 1141  Dressing Intervention New dressing 01/03/21 1141  Dressing Change Due 01/10/21 01/03/21 [place] 01/03/2021, 11:45 AM

## 2021-01-03 NOTE — Progress Notes (Signed)
PROGRESS NOTE        PATIENT DETAILS Name: [name] [name] Age: 80 y.o. Sex: male Date of Birth: 1940-10-21 Admit Date: 12/14/2020 Admitting Physician Georgette Shell, MD IRC:[id number], Arvid Right, MD  Brief Narrative: Patient is a 80 y.o. 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  Hospital course complicated by prolonged encephalopathy. Thankully starting on 10/19-encephalopathy has started to improve.  See below for further details.   Pertinent Labs/Radiology:  Microbiology:  10/10>>Blood culture: Enterococcus Enterococcus faecalis 10/10>> urine culture: Staff aureus 10/11>> blood culture: 1/2-Enterococcus faecalis 10/13>> blood culture: 1/2-Enterococcus faecalis 10/15>> blood culture: Staff epidermidis. 10/17>> blood culture: No growth  Radiology/Echo/EEG:  10/10>> CT head: Small right subdural hematoma 10/11>> TTE: EF-65-70%, no obvious vegetation. 10/13-10/14>> LTM EEG: No seizures. 10/14>> TEE: Prosthetic mitral valve vegetation 10/14>> MRI brain: Acute infarct left cerebellar vermis, small subacute hematoma in the right frontal lobe. 10/30 - PICC   Subjective:   Patient in bed, appears comfortable, denies any headache, no fever, no chest pain or pressure, no shortness of breath , no abdominal pain. No new focal weakness.    Objective: Vitals: Blood pressure (!) 140/91, pulse 71, temperature 98.3 F (36.8 C), temperature source Axillary, resp. rate 20, height 5'  7" (1.702 m), weight 85.3 kg, SpO2 98 %.   Exam:  Awake, weak, L sided weakness 0/5,   [place].AT,PERRAL Supple Neck, No JVD,   Symmetrical Chest wall movement, Good air movement bilaterally, CTAB RRR,No Gallops, Rubs or new Murmurs,  +ve B.Sounds, Abd Soft, No tenderness,   No Cyanosis, Clubbing or edema     Assessment/Plan:  Acute metabolic encephalopathy: Due to hypernatremia/enterococcal bacteremia-had prolonged encephalopathy-but since 10/19-gradually improving.  Gradual but consistent improvement in mental status.  Remains on amantadine 200 mg twice daily for arousal.   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  Repeat blood culture on 10/17 negative so far.  Now on Rocephin/ampicillin-with stop date of 01/28/2021 >> then amoxicillin 500 mg twice daily indefinitely.  Plan to place PICC line sometime next week prior to discharge.  Right wrist tenderness: Unclear etiology-no evidence of erythema swelling on exam.  Uric acid levels unremarkable.  X-ray suggestive of arthritis-better with supportive care and application of Voltaren gel.  Watch and follow.   Transaminitis: Suspect this is multifactorial from statin/Rocephin/underlying bacteremia.  Abdomen is soft.  Statin discontinued on 10/19-LFTs have started to downtrend.  Resume statin 01/01/21    Normocytic anemia: Due to acute illness-no evidence of blood loss-watch/follow CBC closely.  PAF: Continue Tikosyn (pharmacy dosing)- initially on Eliquis-has been switched to IV heparin-if he remains clinically stable Eliquis in a few days.  Acute left cerebellar vermis CVA: Felt to be a septic emboli in the setting of mitral valve endocarditis.    History of seizure disorder: Initially on Keppra-given  lethargy/encephalopathy - switched to Vimpat on 10/16.  LTM EEG negative for seizures.  Recent SDH May 2022: This appears stable on recent imaging.  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 Continues to have significant left-sided hemiparesis. On therapeutic anticoagulation with Eliquis.  History of rheumatic heart disease-s/p remote AVR/MVR (bioprosthetic valve)  Severe failure to thrive syndrome/frailty/poor oral intake/goals of care: Encephalopathy/failure to thrive syndrome have markedly improved-he is now starting to consume more oral intake.  Core track has been clamped off since 12/28/2020 dietitian following calorie count stable with oral intake, DC Core track on 01/01/21.  Constipation with Mild Ileus - placed on B.regimen, resolved.  Frequent bouts of dehydration.  Gentle IV fluids on a as needed basis once or twice a week, monitor urine color and serum electrolytes once a week at SNF.    Goals of care: Long discussion with spouse/2 sons on 10/20 by this MD, goals of care as follows.  1.DNR  2.Family fully well aware that it is unlikely that after this acute illness-patient will achieve his prior level of functioning/mental acuity.  However, if he is able to interact and enjoy some quality time with family/grandkids (even in a wheelchair)-they feel that this is something that the patient would want. 3.Calorie count over the weekend-if mental/clinical status continues to improve-okay to proceed with PEG tube placement if felt to be needed. 4.Apart from PEG tube placement-no further aggressive care.  Family aware that patient is not a candidate for redo valve replacement surgery etc.   5.Family wants to give it several weeks-to allow for further mental status/functional improvement.Family okay with being discharged to SNF with peg tube (if needed)/PICC line-and palliative  care follow-up.   6.If while at SNF-if felt to have no significant improvement in quality of life or if he suffers a significant decompensation-family willing to consider transitioning to comfort measures then.  7.They would like treatment with antibiotics/IV fluids/hospitalization for mild to moderate infection/electrolyte disorders. 8. Family aware that if in the interim (while hospitalized)-patient has significant decline/acute decompensation-organ damage/ICU level of care-family would be okay to transitioning to comfort measures in that event.    Nutrition Status: Nutrition Problem: Severe Malnutrition Etiology: chronic illness (CHF, Stroke) Signs/Symptoms: percent weight loss, moderate fat depletion, severe muscle depletion Percent weight loss: 12 % Interventions: MVI, Hormel Shake    Procedures: None Consults: Cardiology, neurology, infectious disease DVT Prophylaxis: Eliquis >> Hep gtt Code Status:DNR Family Communication: [name] at bedside 12/27/20, 12/28/20, 12/29/20, 12/30/20, 12/31/20, 01/01/21, 01/02/2021 with son along with her,01/03/21  Time spent: 53 minutes-Greater than 50% of this time was spent in counseling, explanation of diagnosis, planning of further management, and coordination of care.  Diet: Diet Order             DIET DYS 3 Room service appropriate? Yes; Fluid consistency: Thin; Fluid restriction: 1800 mL Fluid  Diet effective now                      Disposition Plan: Status is: Inpatient  Remains inpatient appropriate because:Inpatient level of care appropriate due to severity of illness  Dispo: The patient is from: SNF              Anticipated d/c is to: SNF              Patient currently is not medically stable to d/c.   Difficult to place patient No    Barriers to Discharge: Persistent encephalopathy-persistent bacteremia with Enterococcus-on IV antibiotics-NG tube  feedings ongoing due to poor oral intake.  Antimicrobial  agents: Anti-infectives (From admission, onward)    Start     Dose/Rate Route Frequency Ordered Stop   12/25/20 1600  ampicillin (OMNIPEN) 2 g in sodium chloride 0.9 % 100 mL IVPB        2 g 300 mL/hr over 20 Minutes Intravenous Every 4 hours 12/25/20 1227 01/28/21 2359   12/18/20 1500  cefTRIAXone (ROCEPHIN) 2 g in sodium chloride 0.9 % 100 mL IVPB        2 g 200 mL/hr over 30 Minutes Intravenous Every 12 hours 12/18/20 1344 01/28/21 2359   12/15/20 1700  cefTRIAXone (ROCEPHIN) 1 g in sodium chloride 0.9 % 100 mL IVPB  Status:  Discontinued        1 g 200 mL/hr over 30 Minutes Intravenous Every 24 hours 12/14/20 1648 12/15/20 0624   12/15/20 0630  ampicillin (OMNIPEN) 2 g in sodium chloride 0.9 % 100 mL IVPB  Status:  Discontinued        2 g 300 mL/hr over 20 Minutes Intravenous Every 6 hours 12/15/20 0624 12/25/20 1227   12/15/20 0630  gentamicin (GARAMYCIN) IVPB 80 mg        80 mg 100 mL/hr over 30 Minutes Intravenous  Once 12/15/20 0628 12/15/20 0732   12/14/20 1645  cefTRIAXone (ROCEPHIN) 1 g in sodium chloride 0.9 % 100 mL IVPB        1 g 200 mL/hr over 30 Minutes Intravenous  Once 12/14/20 1642 12/14/20 1830        MEDICATIONS: Scheduled Meds:  amantadine  200 mg Oral BID   apixaban  5 mg Oral BID   atorvastatin  80 mg Oral Daily   bisacodyl  10 mg Rectal Once   [START ON 01/04/2021] bisacodyl  10 mg Rectal QODAY   diclofenac Sodium  4 g Topical QID   docusate  100 mg Oral BID   dofetilide  375 mcg Oral BID   doxazosin  1 mg Oral Daily   famotidine  20 mg Oral Daily   finasteride  5 mg Oral Daily   fluocinonide cream   Topical BID   insulin aspart  0-9 Units Subcutaneous TID WC   lacosamide  50 mg Oral BID   magnesium gluconate  500 mg Oral Daily   mouth rinse  15 mL Mouth Rinse BID   multivitamin with minerals  1 tablet Oral Daily   potassium chloride  40 mEq Oral Once   Continuous Infusions:  ampicillin (OMNIPEN) IV 2 g (01/03/21 2878)   cefTRIAXone  (ROCEPHIN)  IV 2 g (01/02/21 2240)   PRN Meds:.ondansetron (ZOFRAN) IV   I have personally reviewed following labs and imaging studies  LABORATORY DATA:  Recent Labs  Lab 12/29/20 0407 12/30/20 0211 12/31/20 0101 01/01/21 0042 01/02/21 0240  WBC 12.4* 14.3* 13.0* 10.5 10.3  HGB 7.5* 8.5* 8.2* 7.6* 7.9*  HCT 23.8* 26.8* 25.7* 24.4* 25.0*  PLT 243 268 268 252 254  MCV 89.1 90.2 89.9 91.4 92.6  MCH 28.1 28.6 28.7 28.5 29.3  MCHC 31.5 31.7 31.9 31.1 31.6  RDW 17.2* 17.8* 17.9* 18.0* 17.8*    Recent Labs  Lab 12/29/20 0407 12/30/20 0211 12/31/20 0101 01/01/21 0042 01/01/21 1817 01/02/21 0240 01/03/21 0221  NA 135 135 136 138 138 137 138  K 3.8 3.9 4.2 4.2 3.8 4.3 3.7  CL 104 103 105 106 109 106 109  CO2 24 22 25 26 22 23 23   GLUCOSE 103*  126* 149* 104* 135* 122* 100*  BUN 17 17 14 12 13 15 17   CREATININE 0.82 0.86 0.83 0.84 0.84 0.88 0.93  CALCIUM 7.7* 8.3* 8.4* 8.3* 8.2* 8.4* 8.3*  AST 41 47* 45* 37  --  52* 32  ALT 80* 89* 80* 68*  --  77* 63*  ALKPHOS 87 109 94 88  --  105 94  BILITOT 0.6 0.5 0.3 0.5  --  0.7 0.4  ALBUMIN 1.7* 1.9* 1.8* 1.9*  --  2.0* 1.9*  MG 2.0 1.9 2.2 2.1 2.1 2.0 2.0  PROCALCITON 0.13 <0.10 0.10 <0.10  --  <0.10  --   BNP 207.1* 225.4* 277.2* 148.8*  --  229.4*  --      Urine analysis:    Component Value Date/Time   COLORURINE YELLOW 12/14/2020 1600   APPEARANCEUR CLOUDY (A) 12/14/2020 1600   LABSPEC 1.020 12/14/2020 1600   PHURINE 6.0 12/14/2020 1600   GLUCOSEU NEGATIVE 12/14/2020 1600   HGBUR LARGE (A) 12/14/2020 1600   BILIRUBINUR NEGATIVE 12/14/2020 1600   BILIRUBINUR negative 11/22/2013 0848   KETONESUR NEGATIVE 12/14/2020 1600   PROTEINUR NEGATIVE 12/14/2020 1600   UROBILINOGEN 0.2 11/22/2013 0848   UROBILINOGEN 1.0 08/05/2013 2207   NITRITE POSITIVE (A) 12/14/2020 1600   LEUKOCYTESUR LARGE (A) 12/14/2020 1600    Sepsis Labs: Lactic Acid, Venous    Component Value Date/Time   LATICACIDVEN 1.8 12/14/2020 2056      RADIOLOGY STUDIES/RESULTS: No results found.   LOS: 19 days   Signature  Lala Lund M.D on 01/03/2021 at 11:35 AM   -  To page go to [email or web address]

## 2021-01-04 DIAGNOSIS — I38 Endocarditis, valve unspecified: Secondary | ICD-10-CM | POA: Diagnosis not present

## 2021-01-04 DIAGNOSIS — T826XXD Infection and inflammatory reaction due to cardiac valve prosthesis, subsequent encounter: Secondary | ICD-10-CM | POA: Diagnosis not present

## 2021-01-04 LAB — GLUCOSE, CAPILLARY
Glucose-Capillary: 91 mg/dL (ref 70–99)
Glucose-Capillary: 117 mg/dL — ABNORMAL HIGH (ref 70–99)
Glucose-Capillary: 127 mg/dL — ABNORMAL HIGH (ref 70–99)
Glucose-Capillary: 139 mg/dL — ABNORMAL HIGH (ref 70–99)

## 2021-01-04 LAB — RESP PANEL BY RT-PCR (FLU A&B, COVID) ARPGX2
Influenza A by PCR: NEGATIVE
Influenza B by PCR: NEGATIVE
SARS Coronavirus 2 by RT PCR: NEGATIVE

## 2021-01-04 LAB — SARS CORONAVIRUS 2 (TAT 6-24 HRS): SARS Coronavirus 2: NEGATIVE

## 2021-01-04 MED ORDER — APIXABAN 5 MG PO TABS: 5.0000 mg | ORAL_TABLET | Freq: Two times a day (BID) | ORAL | 0 refills | Status: DC

## 2021-01-04 MED ORDER — BISACODYL 10 MG RE SUPP: 10.0000 mg | Freq: Every day | RECTAL | 0 refills | Status: DC | PRN

## 2021-01-04 MED ORDER — CEFTRIAXONE IV (FOR PTA / DISCHARGE USE ONLY): 2.0000 g | Freq: Two times a day (BID) | INTRAVENOUS | 0 refills | Status: DC

## 2021-01-04 MED ORDER — AMANTADINE HCL 50 MG/5ML PO SOLN: ORAL | Status: DC

## 2021-01-04 MED ORDER — FINASTERIDE 5 MG PO TABS: 5.0000 mg | ORAL_TABLET | Freq: Every day | ORAL | 0 refills | Status: DC

## 2021-01-04 MED ORDER — ATORVASTATIN CALCIUM 80 MG PO TABS: 80.0000 mg | ORAL_TABLET | Freq: Every day | ORAL | Status: DC

## 2021-01-04 MED ORDER — DOCUSATE SODIUM 100 MG PO CAPS: 100.0000 mg | ORAL_CAPSULE | Freq: Two times a day (BID) | ORAL | 0 refills | Status: DC

## 2021-01-04 MED ORDER — AMLODIPINE BESYLATE 10 MG PO TABS
10.0000 mg | ORAL_TABLET | Freq: Every day | ORAL | Status: DC
  Administered 2021-01-04 – 2021-01-05 (×2): 10 mg via ORAL
  Filled 2021-01-04 (×2): qty 1

## 2021-01-04 MED ORDER — ADULT MULTIVITAMIN W/MINERALS CH: 1.0000 | ORAL_TABLET | Freq: Every day | ORAL | Status: DC

## 2021-01-04 MED ORDER — LACOSAMIDE 50 MG PO TABS: 50.0000 mg | ORAL_TABLET | Freq: Two times a day (BID) | ORAL | Status: DC

## 2021-01-04 MED ORDER — AMLODIPINE BESYLATE 10 MG PO TABS: 10.0000 mg | ORAL_TABLET | Freq: Every day | ORAL | Status: DC

## 2021-01-04 MED ORDER — MAGNESIUM GLUCONATE 500 MG PO TABS: 500.0000 mg | ORAL_TABLET | Freq: Every day | ORAL | 0 refills | Status: DC

## 2021-01-04 MED ORDER — DOFETILIDE 125 MCG PO CAPS: 375.0000 ug | ORAL_CAPSULE | Freq: Two times a day (BID) | ORAL | Status: DC

## 2021-01-04 MED ORDER — AMPICILLIN IV (FOR PTA / DISCHARGE USE ONLY): 12.0000 g | INTRAVENOUS | 0 refills | Status: DC

## 2021-01-04 NOTE — Progress Notes (Signed)
Physical Therapy Treatment Patient Details Name: [name] [name] MRN: [id number] DOB: Oct 23, 1940 Today's Date: 01/04/2021   History of Present Illness 80 y/o male presented to ED on 10/10 for AMS and hypernatremia. TEE suggestive of bioprosthetic valve endocarditis. 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    PT Comments    Patient seen to work on ROM, sitting balance, and bed mobility. In sitting, pt overuses RUE and pushes himself to his left. Even with RUE placed in lap, pt elevates rt shoulder and has great difficulty relaxing RUE, so again ends up leaning to his left. Very limited cervical ROM (maintains to rt and cannot achieve midline unless assisted via AAROM). See below for further details of session.     Recommendations for follow up therapy are one component of a multi-disciplinary discharge planning process, led by the attending physician.  Recommendations may be updated based on patient status, additional functional criteria and insurance authorization.  Follow Up Recommendations  Skilled nursing-short term rehab (<3 hours/day)     Assistance Recommended at Discharge Frequent or constant Supervision/Assistance  Equipment Recommendations  None recommended by PT    Recommendations for Other Services       Precautions / Restrictions Precautions Precautions: Fall     Mobility  Bed Mobility Overal bed mobility: Needs Assistance Bed Mobility: Sit to Sidelying;Sidelying to Sit   Sidelying to sit: Total assist;+2 for physical assistance;HOB elevated     Sit to sidelying: Total assist;+2 for physical assistance General bed mobility comments: rolling rt and left initially for pericare due to soiled with BM on arrival; Pt requires +2 total assist to roll either direction; can assist to hold himself on  his right side with RUE on rail; rolled onto side, feet assisted over EOB (pt helped minimally with RLE), HOB then elevated and assisted pt to full sitting EOB; pt assists with return to sidelying with leaning to his left    Transfers                        Ambulation/Gait                 Stairs             Wheelchair Mobility    Modified Rankin (Stroke Patients Only)       Balance Overall balance assessment: Needs assistance Sitting-balance support: Single extremity supported;Feet supported Sitting balance-Leahy Scale: Poor Sitting balance - Comments: required min assist to maintain neutral; leaning onto rt elbow/forearm and pt assisted with push up to sitting x 1 of 3 trials; worked on head rotation to left with pt unable to achieve neutral/midline (remains rotated to right) Postural control: Posterior lean;Left lateral lean                                  Cognition Arousal/Alertness: Awake/alert (initially asleep, but once awake maintained eyes open x 20 minutes) Behavior During Therapy: Flat affect Overall Cognitive Status: Difficult to assess                                 General Comments: followed multiple one step commands        Exercises  Other Exercises Other Exercises: AAROM left hand and positioned on folded towel for finger extension. Wife reports he has a splint he was wearing at night at SNF    General Comments        Pertinent Vitals/Pain Pain Assessment: Faces Faces Pain Scale: No hurt    Home Living                          Prior Function            PT Goals (current goals can now be found in the care plan section) Acute Rehab PT Goals Patient Stated Goal: unable PT Goal Formulation: With family Time For Goal Achievement: 01/12/21 Potential to Achieve Goals: Fair Progress towards PT goals: Progressing toward goals    Frequency    Min 2X/week      PT Plan Current  plan remains appropriate    Co-evaluation              AM-PAC PT "6 Clicks" Mobility   Outcome Measure  Help needed turning from your back to your side while in a flat bed without using bedrails?: Total Help needed moving from lying on your back to sitting on the side of a flat bed without using bedrails?: Total Help needed moving to and from a bed to a chair (including a wheelchair)?: Total Help needed standing up from a chair using your arms (e.g., wheelchair or bedside chair)?: Total Help needed to walk in hospital room?: Total Help needed climbing 3-5 steps with a railing? : Total 6 Click Score: 6    End of Session   Activity Tolerance: Patient tolerated treatment well (VSS) Patient left: in bed;with family/visitor present;with bed alarm set Nurse Communication: Other (comment) (pt needs new primofit and mepilex on sacrum) PT Visit Diagnosis: Hemiplegia and hemiparesis Hemiplegia - Right/Left: Left Hemiplegia - dominant/non-dominant: Non-dominant Hemiplegia - caused by: Cerebral infarction     Time: 8264-1583 PT Time Calculation (min) (ACUTE ONLY): 42 min  Charges:  $Therapeutic Activity: 38-52 mins                      Arby Barrette, PT Acute Rehabilitation Services  Pager [phone] Office [phone]    Rexanne Mano 01/04/2021, 10:27 AM

## 2021-01-04 NOTE — Discharge Instructions (Signed)
Follow with Primary MD [name], MD in 7 days   Get CBC, CMP, Magnesium -  checked by SNF MD  once per week for 3 weeks.  Activity: As tolerated with Full fall precautions use walker/cane & assistance as needed  Disposition  SNF  Diet: Dysphagia 3 diet with feeding assistance and aspiration precautions.    Special Instructions: If you have smoked or chewed Tobacco  in the last 2 yrs please stop smoking, stop any regular Alcohol  and or any Recreational drug use.  On your next visit with your primary care physician please Get Medicines reviewed and adjusted.  Please request your Prim.MD to go over all Hospital Tests and Procedure/Radiological results at the follow up, please get all Hospital records sent to your Prim MD by signing hospital release before you go home.  If you experience worsening of your admission symptoms, develop shortness of breath, life threatening emergency, suicidal or homicidal thoughts you must seek medical attention immediately by calling 911 or calling your MD immediately  if symptoms less severe.  You Must read complete instructions/literature along with all the possible adverse reactions/side effects for all the Medicines you take and that have been prescribed to you. Take any new Medicines after you have completely understood and accpet all the possible adverse reactions/side effects.

## 2021-01-04 NOTE — TOC Progression Note (Signed)
Transition of Care [facility]) - Progression Note    Patient Details  Name: [name] [name] MRN: [id number] Date of Birth: [birth year]  [facility] of [facility] CM/SW [place], [place] Phone Number: 01/04/2021, 10:01 AM  Clinical Narrative:    [facility] CSW reached out to [place] regarding Mr. Miller possible discharge today however CSW had to leave a voicemail for them to return the call. Rapid COVID test requested awaiting results.  CSW will continue to follow throughout discharge.   Expected Discharge Plan: [place] Barriers to Discharge: Continued Medical Work up  Expected Discharge Plan and Services Expected Discharge Plan: [place] In-house Referral: Clinical Social Work   Post Acute Care Choice: [place] Living arrangements for the past 2 months: [place] Expected Discharge Date: 01/04/21                                     Social Determinants of Health (SDOH) Interventions    Readmission Risk Interventions No flowsheet data found.  Onia Shiflett, MSW, [place] Heart Failure Social Worker

## 2021-01-04 NOTE — Progress Notes (Addendum)
1500: Patient expected to discharge this afternoon. Nurse called [facility] to give report, Ubaldo Glassing, the nurse at the facility let this nurse know that patient's IV medications were not available from their pharmacy.  The DON of [facility] called nursing to see if patient could stay one more night at the hospital so they could have more time to receive patient's IV medications. Dr. Candiss Norse approved patient to stay one more night and discharge in the morning back to SNF.  1515: Report given to Ubaldo Glassing, RN time for questions and clarification given.

## 2021-01-04 NOTE — TOC Transition Note (Addendum)
Transition of Care [facility]) - CM/SW Discharge Note   Patient Details  Name: [name] [name] MRN: [id number] Date of Birth: 04/08/1940  [facility] CM/SW Contact:  [place], [place] Phone Number: 01/04/2021, 11:00 AM   Clinical Narrative:    Patient will DC to: [facility], SNF Anticipated DC date: 01/04/21 Family notified: Yes, spouse Transport by: [facility]   Per MD patient ready for DC to [facility], SNF. RN to call report prior to discharge [phone]). RN, patient, patient's family, and facility notified of DC. Discharge Summary and FL2 sent to facility. DC packet on chart. COVID test negative. Ambulance transport requested for patient with [facility] with an eta of 4:30pm.  CSW will sign off for now as social work intervention is no longer needed. Please consult [place] again if new needs arise.     Final next level of care: [place] Barriers to Discharge: No Barriers Identified   Patient Goals and CMS Choice Patient states their goals for this hospitalization and ongoing recovery are:: [facility] to [facility].gov Compare Post Acute Care list provided to:: Patient Choice offered to / list presented to : Patient  Discharge Placement PASRR number recieved: 01/04/21            Patient chooses bed at: [facility] Patient to be transferred to facility by: [facility] or [facility] pending availability Name of family member notified: Spouse Gavinn Collard [phone] Patient and family notified of of transfer: 01/04/21  Discharge Plan and Services In-house Referral: Clinical Social Work   Post Acute Care Choice: [place]                               Social Determinants of Health (SDOH) Interventions     Readmission Risk Interventions No flowsheet data found.   Yuri Flener, MSW, [place] Heart Failure Social Worker

## 2021-01-04 NOTE — TOC Progression Note (Addendum)
Transition of Care [facility]) - Progression Note    Patient Details  Name: [name] [name] MRN: [id number] Date of Birth: 28-May-1940  Transition of Care [facility]) CM/SW Rosa, Carmine Phone Number: 01/04/2021, 3:25 PM  Clinical Narrative:    HF CSW reached out to [facility] numerous times today.  At 9:59am - Butch Penny with [facility] [phone] accepted [name] [name] discharging today and to send the DC summary over which CSW sent over in the hub.  10:51am - CSW spoke with the patient's wife regarding medication concerns reporting that [facility] doesn't have the IV antibiotics for [name] [name] today and requested transportation for 6pm this evening for [facility] to get his medications in order for [name]. Transportation with [facility] scheduled for 8pm (only spot left available).  3:13pm - The nursing director Collie Siad [phone] from [facility] reported that they do not have the IV antibiotics available for [name] [name] to [name] today and that he cannot [name] to the facility until tomorrow.  See CSW note from Friday 01/01/21 at 11:37am indicating Donna/[facility] was fully aware that [name] [name] may [name] over the weekend but prefers that patient's [name] during the week. [facility] was fully aware that [name] [name] will be discharging soon from the hospital. 3:25pm - Butch Penny finally returned the CSW's phone calls from today reporting that [name] [name] will need to [name] tomorrow due to the IV antibiotics as they don't have his medications in stock and cannot accept transportation for discharges late in the evening and to have the attending MD DC the patient tomorrow.  3:33pm - CSW called [facility] and cancelled transportation. The patient's nurse informed the facility/[facility] that the attending MD has agreed to [name] tomorrow and they have the final DC summary and will only have a brief a progress note tomorrow for [name] [name] [name] at that time. 4:00pm - The patient's  wife is requesting her husband [name] at 11am tomorrow relayed by the nurse and CSW informed the nurse that it just depends on what is available for transportation. CSW called [facility] and scheduled transportation for 12pm tomorrow and notified the patient's nurse.  CSW will continue to follow throughout [name].   Expected [name] Plan: [place] Barriers to [name]: No Barriers Identified  Expected [name] Plan and Services Expected [name] Plan: [place] In-house Referral: Clinical Social Work   Post Acute Care Choice: [place] Living arrangements for the past 2 months: [place] Expected [name] Date: 01/04/21                                     Social Determinants of Health (SDOH) Interventions    Readmission Risk Interventions No flowsheet data found.  Braylon Lemmons, MSW, [place] Heart Failure Social Worker

## 2021-01-04 NOTE — Progress Notes (Signed)
Speech Language Pathology Treatment: Dysphagia  Patient Details Name: [name] [name] MRN: [id number] DOB: 04-Aug-1940 Today's Date: 01/04/2021 Time: 1215-1227 SLP Time Calculation (min) (ACUTE ONLY): 12 min  Assessment / Plan / Recommendation Clinical Impression  [name] [name] seen for dysphagia treatment to assess diet tolerance with pm meal consisting of dysphagia 3 solids and thin liquids. Wife, present for session, set-up meal tray and assisted [name] [name] with PO intake. She provided small bites and sips of thin liquids via cup, alternated solids with liquids/purees and facilitated slow rate of intake with very minimal cueing/modified independence. [name] [name] demonstrated prolonged mastication of dysphagia 3 solids, though complete oral clearance noted. Bites of puree and sips of thin liquids appeared to assist in bolus formation and oral efficiency. Finalized education with wife who expressed no further questions/concerns in regards to swallow function. Recommend continue current diet with SLP to f/u at next venue of care. Will s/o acutely.    HPI HPI: [name] [name] is a 80 y.o. male who was admitted 10/10 for confusion/decreased mentation, and poor intake, [name] [name] found to have enterococcus faecalis bacteremia, encephalopathy, small left cerebellar vermis CVA, constipation with mild illeus which resolved. 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 [name] [name] has a hx of cognitive deficits and dysphagia, with most recent MBS on 09/02/20 and most recent diet recommendations for Dysphagia 1 solids and honey-thick liquids.  Per family, [name] [name] has since advanced to mechanical soft solid and NTL.  Cortrak placed 12/18/20.      SLP Plan  Discharge SLP treatment due to (comment);All goals met      Recommendations for  follow up therapy are one component of a multi-disciplinary discharge planning process, led by the attending physician.  Recommendations may be updated based on patient status, additional functional criteria and insurance authorization.    Recommendations  Diet recommendations: Dysphagia 3 (mechanical soft);Thin liquid Liquids provided via: Cup;No straw Medication Administration: Crushed with puree Supervision: Trained caregiver to feed patient Compensations: Minimize environmental distractions;Slow rate;Small sips/bites;Follow solids with liquid Postural Changes and/or Swallow Maneuvers: Seated upright 90 degrees                Oral Care Recommendations: Oral care BID;Staff/trained caregiver to provide oral care Follow up Recommendations: Skilled Nursing facility SLP Visit Diagnosis: Dysphagia, oropharyngeal phase (R13.12) Plan: Discharge SLP treatment due to (comment);All goals met       [place], [place], [place] Office Number: [phone]   Acie Fredrickson  01/04/2021, 12:35 PM

## 2021-01-04 NOTE — Discharge Summary (Addendum)
[name] [name] TXM:[id number] DOB: 07/01/1940 DOA: 12/14/2020  PCP: [name], MD  Admit date: 12/14/2020  Discharge date: 01/04/2021  Admitted From: SNF   Disposition:  SNF   Recommendations for Outpatient Follow-up:   Follow up with PCP in 1-2 weeks  PCP Please obtain BMP/CBC, 2 view CXR in 1week,  (see Discharge instructions)   PCP Please follow up on the following pending results: Monitor CBC, CMP, blood pressure closely, check electrolytes once a week for 3 weeks, monitor urine color could be early indication for developing dehydration, may benefit from as needed IV fluids if he gets dehydrated.  Monitor bowel movements, ideally should have a bowel movement every other day.  Continue PT OT and speech follow-up closely.   Home Health: None   Equipment/Devices: None  Consultations: Cardiology, neurology, infectious disease Discharge Condition: Fair CODE STATUS: DNR   Diet Recommendation: Dysphagia 3 diet with full feeding assistance and aspiration precautions    Chief Complaint  Patient presents with   Altered Mental Status     Brief history of present illness from the day of admission and additional interim summary    Patient is a 80 y.o. 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  Hospital course complicated by prolonged  encephalopathy. Thankully starting on 10/19-encephalopathy has started to improve.  See below for further details.     Pertinent Labs/Radiology:   Microbiology:   10/10>>Blood culture: Enterococcus Enterococcus faecalis 10/10>> urine culture: Staff aureus 10/11>> blood culture: 1/2-Enterococcus faecalis 10/13>> blood culture: 1/2-Enterococcus faecalis 10/15>> blood culture: Staff epidermidis. 10/17>> blood culture: No growth   Radiology/Echo/EEG:   10/10>> CT head: Small right subdural hematoma 10/11>> TTE: EF-65-70%, no obvious vegetation. 10/13-10/14>> LTM EEG: No seizures. 10/14>> TEE: Prosthetic mitral valve vegetation 10/14>> MRI brain: Acute infarct left cerebellar vermis, small subacute hematoma in the right frontal lobe. 10/30 - PICC                                                                   Hospital Course    Acute metabolic encephalopathy: Due to hypernatremia/enterococcal bacteremia-had prolonged encephalopathy-but since 10/19-gradually improving.  Gradual but consistent improvement in mental status.  Remains on amantadine 200 mg twice daily for arousal.    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  Repeat blood culture on 10/17  negative so far.  Now on Rocephin/ampicillin-with stop date of 01/28/2021 >> then amoxicillin 500 mg twice daily indefinitely.  PICC placed, DC to SNF.   Recent SDH May 2022: This appears stable on recent imaging.   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 Continues to have significant left-sided hemiparesis. On therapeutic anticoagulation with Eliquis.  Has residual left-sided weakness leg more than arm,  expressive aphasia, dysphagia.  Acute left cerebellar vermis CVA: Felt to be a septic emboli in the setting of mitral valve endocarditis.     History of seizure disorder: Initially on Keppra-given lethargy/encephalopathy - switched to Vimpat on 10/16.  LTM EEG negative for seizures.     History of rheumatic heart disease-s/p remote AVR/MVR (bioprosthetic valve)  Right wrist tenderness: Unclear etiology-no evidence of erythema swelling on exam.  Uric acid levels unremarkable.  X-ray suggestive of arthritis-better with supportive care and application of Voltaren gel.  Resolved.    Transaminitis: Suspect this is multifactorial from statin/Rocephin/underlying bacteremia.  Abdomen is soft.  Statin resumed, monitor CMP intermittently at SNF.   Normocytic anemia: Due to acute illness-no evidence of blood loss-watch/follow CBC closely.   PAF: Continue Tikosyn & Eliquis.     Severe failure to thrive syndrome/frailty/poor oral intake/goals of care: Encephalopathy/failure to thrive syndrome have markedly improved-he is now starting to consume more oral intake.  Core track has been clamped off since 12/28/2020 and was finally removed on 01/01/21, good PO intake.   Constipation with Mild Ileus - placed on B.regimen, resolved.   Frequent bouts of dehydration.  Gentle IV fluids on a as needed basis once or twice a week, monitor urine color and serum electrolytes once a week at SNF.   Discharge diagnosis     Active Problems:   Pressure injury of skin   Hypernatremia   Bacteremia   S/P MVR (mitral valve repair)   History of CVA in adulthood   Protein-calorie malnutrition, severe   Prosthetic valve endocarditis [facility])    Discharge instructions    Discharge Instructions     [facility] pharmacist to adjust dose for Vancomycin, Aminoglycosides and other anti-infective therapies as requested by physician.   Complete by: As directed    [facility] to provide Cath Flo 56m    Complete by: As directed    Administer for PICC line occlusion and as ordered by physician for other access device issues.   Anaphylaxis Kit: Provided to treat any anaphylactic reaction to the medication being provided to the patient if First Dose or when requested by physician   Complete by: As directed    Epinephrine 175mml vial / amp: Administer 0.67m64m0.67ml77mubcutaneously once for moderate to severe anaphylaxis, nurse to call physician and pharmacy when reaction occurs and call 911 if needed for immediate care   Diphenhydramine 50mg32mIV vial: Administer 25-50mg 51mM PRN for first dose reaction, rash, itching, mild reaction, nurse to call physician and pharmacy when reaction occurs   Sodium Chloride 0.9% NS 500ml I47mdminister if needed for hypovolemic blood pressure drop or as ordered by physician after call to physician with anaphylactic reaction   Change dressing on IV access line weekly and PRN   Complete by: As directed    Discharge instructions   Complete by: As directed    Follow with Primary MD Jones, [name] 7 days   Get CBC, CMP, Magnesium -  checked  by SNF MD  once per week for 3 weeks.  Activity: As tolerated with Full fall precautions use walker/cane & assistance as needed  Disposition  SNF  Diet: Dysphagia 3 diet with feeding assistance and aspiration precautions.    Special Instructions: If you have smoked or chewed Tobacco  in the last 2 yrs please stop smoking, stop any regular Alcohol  and or any Recreational drug use.  On your next visit with your primary care physician please Get Medicines reviewed and adjusted.  Please request your Prim.MD to go over all Hospital Tests and Procedure/Radiological results at the follow up, please get all Hospital records sent to your Prim MD by signing hospital release before you go home.  If you experience worsening of your admission symptoms, develop shortness of breath, life threatening emergency, suicidal or  homicidal thoughts you must seek medical attention immediately by calling 911 or calling your MD immediately  if symptoms less severe.  You Must read complete instructions/literature along with all the possible adverse reactions/side effects for all the Medicines you take and that have been prescribed to you. Take any new Medicines after you have completely understood and accpet all the possible adverse reactions/side effects.   Discharge wound care:   Complete by: As directed    Pressure Injury 12/14/20 Elbow Left;Posterior Stage 2 -  Partial thickness loss of dermis presenting as a shallow open injury with a red, pink wound bed without slough. Pressure wound is round in shape.   Pressure Injury 12/15/20 Buttocks Right Stage 1 -  Intact skin with non-blanchable redness of a localized area usually over a bony prominence. Whole of buttocks is red. two bands on right mid/lower buttocks measuring 4x1cm are stage I   Flush IV access with Sodium Chloride 0.9% and Heparin 10 units/ml or 100 units/ml   Complete by: As directed    Home infusion instructions - [facility]   Complete by: As directed    Instructions: Flush IV access with Sodium Chloride 0.9% and Heparin 10units/ml or 100units/ml   Change dressing on IV access line: Weekly and PRN   Instructions Cath Flo 32m: Administer for PICC Line occlusion and as ordered by physician for other access device   [facility] pharmacist to adjust dose for: Vancomycin, Aminoglycosides and other anti-infective therapies as requested by physician   Increase activity slowly   Complete by: As directed    Method of administration may be changed at the discretion of home infusion pharmacist based upon assessment of the patient and/or caregiver's ability to self-administer the medication ordered   Complete by: As directed        Discharge Medications   Allergies as of 01/04/2021       Reactions   Naproxen Hives   No [employer] Food  Allergies Other (See Comments)   Scallops - distress, nausea and vomitting        Medication List     STOP taking these medications    hydrALAZINE 10 MG tablet Commonly known as: APRESOLINE   levETIRAcetam 500 MG tablet Commonly known as: KEPPRA   losartan 25 MG tablet Commonly known as: COZAAR   metoprolol tartrate 25 MG tablet Commonly known as: LOPRESSOR   potassium chloride 20 MEQ packet Commonly known as: KLOR-CON   sennosides-docusate sodium 8.6-50 MG tablet Commonly known as: SENOKOT-S   sertraline 25 MG tablet Commonly known as: ZOLOFT   traZODone 50 MG tablet Commonly known as: DESYREL       TAKE  these medications    amantadine 50 MG/5ML solution Commonly known as: SYMMETREL 8 AM and 2 PM   amLODipine 10 MG tablet Commonly known as: NORVASC Take 1 tablet (10 mg total) by mouth daily.   ampicillin  IVPB Inject 12 g into the vein daily. As a continuous infusion. Indication:  Enterococcus endocarditis  First Dose: Yes Last Day of Therapy:  01/28/21 Labs - Once weekly:  CBC/D and BMP, Labs - Every other week:  ESR and CRP Method of administration: Ambulatory Pump (Continuous Infusion) Method of administration may be changed at the discretion of home infusion pharmacist based upon assessment of the patient and/or caregiver's ability to self-administer the medication ordered.   apixaban 5 MG Tabs tablet Commonly known as: ELIQUIS Take 1 tablet (5 mg total) by mouth 2 (two) times daily.   atorvastatin 80 MG tablet Commonly known as: LIPITOR Take 1 tablet (80 mg total) by mouth daily.   bisacodyl 10 MG suppository Commonly known as: DULCOLAX Place 1 suppository (10 mg total) rectally daily as needed for moderate constipation.   cefTRIAXone  IVPB Commonly known as: ROCEPHIN Inject 2 g into the vein every 12 (twelve) hours. Indication:  Enterococcal endocarditis  First Dose: Yes Last Day of Therapy:  01/28/21 Labs - Once weekly:  CBC/D and  BMP, Labs - Every other week:  ESR and CRP Method of administration: IV Push Method of administration may be changed at the discretion of home infusion pharmacist based upon assessment of the patient and/or caregiver's ability to self-administer the medication ordered.   clobetasol cream 0.05 % Commonly known as: TEMOVATE Apply 1 application topically daily as needed (for skin irritation).   docusate sodium 100 MG capsule Commonly known as: Colace Take 1 capsule (100 mg total) by mouth 2 (two) times daily.   dofetilide 125 MCG capsule Commonly known as: TIKOSYN Take 3 capsules (375 mcg total) by mouth 2 (two) times daily. What changed:  medication strength how much to take   famotidine 20 MG tablet Commonly known as: PEPCID Take 20 mg by mouth at bedtime as needed.   feeding supplement (GLUCERNA SHAKE) Liqd Take 237 mLs by mouth 2 (two) times daily between meals. For weight loss   finasteride 5 MG tablet Commonly known as: PROSCAR Take 1 tablet (5 mg total) by mouth daily.   hydrocortisone cream 1 % Apply topically 2 (two) times daily as needed for itching (rash).   lacosamide 50 MG Tabs tablet Commonly known as: VIMPAT Take 1 tablet (50 mg total) by mouth 2 (two) times daily.   loratadine 10 MG tablet Commonly known as: CLARITIN Take 10 mg by mouth at bedtime.   magnesium gluconate 500 MG tablet Commonly known as: MAGONATE Take 1 tablet (500 mg total) by mouth daily.   multivitamin with minerals Tabs tablet Take 1 tablet by mouth daily. Start taking on: January 05, 2021   polyethylene glycol 17 g packet Commonly known as: MIRALAX / GLYCOLAX Take 17 g by mouth daily as needed.   polyvinyl alcohol 1.4 % ophthalmic solution Commonly known as: LIQUIFILM TEARS Place 1 drop into both eyes 2 (two) times daily.   tamsulosin 0.4 MG Caps capsule Commonly known as: FLOMAX Take 0.4 mg by mouth daily.               Discharge Care Instructions  (From  admission, onward)           Start     Ordered   01/04/21 0000  Change  dressing on IV access line weekly and PRN  (Home infusion instructions - [facility] )        01/04/21 1014   01/04/21 0000  Discharge wound care:       Comments: Pressure Injury 12/14/20 Elbow Left;Posterior Stage 2 -  Partial thickness loss of dermis presenting as a shallow open injury with a red, pink wound bed without slough. Pressure wound is round in shape.   Pressure Injury 12/15/20 Buttocks Right Stage 1 -  Intact skin with non-blanchable redness of a localized area usually over a bony prominence. Whole of buttocks is red. two bands on right mid/lower buttocks measuring 4x1cm are stage I   01/04/21 1014             Follow-up Information     Garvin Fila, MD. Go on 02/03/2021.   Specialties: Neurology, Radiology Why: stroke clinic Contact information: [address] [place] [address] [phone]         [name], MD. Schedule an appointment as soon as possible for a visit in 1 week(s).   Specialty: Internal Medicine Contact information: [place] [address] [phone]         Larey Dresser, MD. Schedule an appointment as soon as possible for a visit in 1 week(s).   Specialty: Cardiology Contact information: [address] [place] [address] [phone]         Alexis Frock, MD. Schedule an appointment as soon as possible for a visit in 1 week(s).   Specialty: Urology Why: BPH Contact information: [place] [address] [phone]         Thayer Headings, MD Follow up in 2 week(s).   Specialty: Infectious Diseases Why: Endocarditis Contact information: [address] [place] [address] [phone]                 Major procedures and Radiology Reports - PLEASE review detailed and final reports thoroughly  -        DG Wrist 2 Views  Right  Result Date: 12/22/2020 CLINICAL DATA:  Tenderness with movement. EXAM: RIGHT WRIST - 2 VIEW COMPARISON:  None. FINDINGS: Radiocarpal joint appears within normal limits. There are degenerative changes the articulation between the scaphoid and the multangular bones and at the first and second carpometacarpal articulations. No sign of fracture. Regional arterial calcification incidentally noted. IMPRESSION: Radial side degenerative changes as described above which could certainly be painful. Electronically Signed   By: Nelson Chimes M.D.   On: 12/22/2020 13:51   CT Head Wo Contrast  Result Date: 12/14/2020 CLINICAL DATA:  Mental status change, unknown cause EXAM: CT HEAD WITHOUT CONTRAST TECHNIQUE: Contiguous axial images were obtained from the base of the skull through the vertex without intravenous contrast. 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 Old left parietal infarct. Findings are stable since prior study. Small right subdural hematoma again noted overlying the right frontoparietal lobe, 3 mm compared to 5 mm previously. There is atrophy and chronic small vessel disease changes. Ventriculomegaly likely rib related to ex vacuo dilatation. No acute infarct or acute hemorrhage. Vascular: No hyperdense vessel or unexpected calcification. Skull: Prior right temporoparietal craniotomy. No acute calvarial abnormality. Sinuses/Orbits: No acute findings Other: None IMPRESSION: Small right subdural hematoma, 3 mm compared with 5 mm previously. No new hemorrhage. Old right MCA and left parietal infarcts, stable. Ventriculomegaly related to ex vacuo  dilatation. Atrophy, chronic microvascular disease. No acute intracranial abnormality. Electronically Signed   By: Rolm Baptise M.D.   On: 12/14/2020 13:50   MR BRAIN WO CONTRAST  Result Date: 12/18/2020 CLINICAL DATA:  Mental status change, unknown cause EXAM: MRI HEAD WITHOUT CONTRAST TECHNIQUE: Multiplanar,  multiecho pulse sequences of the brain and surrounding structures were obtained without intravenous contrast. COMPARISON:  08/29/2020 FINDINGS: Motion artifact is present. Brain: Small focus of reduced diffusion is present within the left cerebellar vermis. Diffusion hyperintensity in the region of prior right MCA infarct is probably artifactual. There is a 1.2 cm subacute hematoma of the anterior right superior frontal gyrus (series 6, image 18). Chronic right MCA territory infarction with chronic blood products. Ex vacuo dilatation of the adjacent right lateral ventricle. Chronic infarct on the left near junction of parietal, temporal, and occipital lobes also with chronic blood products. Additional confluent T2 hyperintensity in the supratentorial white matter is nonspecific but may reflect chronic microvascular ischemic changes. Disproportionate prominence of the lateral and third ventricles is unchanged. Thin extra-axial collection underlies craniotomy. Vascular: Major vessel flow voids at the skull base are preserved. Skull and upper cervical spine: Normal marrow signal is preserved. Prior right craniotomy. Sinuses/Orbits: Minor mucosal thickening. No acute orbital abnormality. Other: Sella is unremarkable.  Mastoid air cells are clear. IMPRESSION: Small acute infarct left cerebellar vermis. Small subacute hematoma anterior right frontal lobe (isodense on CT). Stable chronic findings of infarcts, volume loss, and chronic microvascular ischemic changes. Electronically Signed   By: Macy Mis M.D.   On: 12/18/2020 12:49   DG Chest Port 1 View  Result Date: 12/31/2020 CLINICAL DATA:  Shortness of breath EXAM: PORTABLE CHEST 1 VIEW COMPARISON:  Chest x-ray dated December 30, 2020 FINDINGS: Cardiac and mediastinal contours are unchanged post median sternotomy. Slightly increased bilateral central opacities. No large pleural effusion or evidence of pneumothorax. Enteric tube partially visualized coursing  below the diaphragm. IMPRESSION: Slightly increased bilateral interstitial opacities, likely due to worsening pulmonary edema. Electronically Signed   By: Yetta Glassman M.D.   On: 12/31/2020 08:11   DG Chest Port 1 View  Result Date: 12/30/2020 CLINICAL DATA:  Stroke, AFib, sepsis EXAM: PORTABLE CHEST 1 VIEW COMPARISON:  Radiograph 12/29/2020 FINDINGS: Feeding tube passes below diaphragm, tip excluded by collimation. Prior median sternotomy and valve replacement. Unchanged cardiomegaly. Diffuse interstitial opacities, similar to prior exam. No large pleural effusion or visible pneumothorax. Bones are unchanged. IMPRESSION: Unchanged cardiomegaly. Diffuse interstitial opacities consistent with pulmonary edema. Electronically Signed   By: Maurine Simmering M.D.   On: 12/30/2020 08:45   DG Chest Port 1 View  Result Date: 12/29/2020 CLINICAL DATA:  Shortness of breath EXAM: PORTABLE CHEST 1 VIEW COMPARISON:  12/28/2020 FINDINGS: Cardiomegaly status post median sternotomy with aortic and mitral valvular prosthesis. Mild, diffuse bilateral interstitial pulmonary opacity, similar compared to prior examination. No new airspace opacity. Partially imaged enteric feeding tube. IMPRESSION: Cardiomegaly. Mild, diffuse bilateral interstitial pulmonary opacity, similar compared to prior examination, consistent with edema or infection. No new airspace opacity. Electronically Signed   By: Delanna Ahmadi M.D.   On: 12/29/2020 11:40   DG Chest Port 1 View  Result Date: 12/28/2020 CLINICAL DATA:  Shortness of breath, CHF EXAM: PORTABLE CHEST 1 VIEW COMPARISON:  Portable exam 0000 hours compared to 12/14/2020 FINDINGS: Feeding tube traverses esophagus into stomach. Enlargement of cardiac silhouette post median sternotomy, MVR, and AVR. Atherosclerotic calcification aorta. Minimal bibasilar atelectasis. Lungs otherwise clear. No infiltrate, pleural effusion, or pneumothorax. No  acute osseous findings. IMPRESSION: Minimal  bibasilar atelectasis. Enlargement of cardiac silhouette post MVR and AVR. Electronically Signed   By: Lavonia Dana M.D.   On: 12/28/2020 08:36   DG Chest Port 1 View  Result Date: 12/14/2020 CLINICAL DATA:  Altered mental status EXAM: PORTABLE CHEST 1 VIEW COMPARISON:  Chest radiograph 08/07/2020 FINDINGS: Median sternotomy wires and mitral and aortic valve prostheses are stable. The cardiomediastinal silhouette is stable. There is calcified atherosclerotic plaque of the aortic arch. Lung volumes are low. There is no focal consolidation or pulmonary edema. There is no pleural effusion or pneumothorax. There is no acute osseous abnormality. IMPRESSION: Low lung volumes. Otherwise, no radiographic evidence of acute cardiopulmonary process. Electronically Signed   By: Valetta Mole M.D.   On: 12/14/2020 13:01   DG Abd Portable 1V  Result Date: 12/31/2020 CLINICAL DATA:  Ileus. EXAM: PORTABLE ABDOMEN - 1 VIEW COMPARISON:  December 30, 2020. FINDINGS: Distal tip of feeding tube is seen in expected position of distal stomach. No significant bowel dilatation is seen at this time. Phleboliths are noted in the pelvis. IMPRESSION: No significant abnormal bowel dilatation is seen at this time. Electronically Signed   By: Marijo Conception M.D.   On: 12/31/2020 08:13   DG Abd Portable 1V  Result Date: 12/30/2020 CLINICAL DATA:  Nausea and vomiting. EXAM: PORTABLE ABDOMEN - 1 VIEW COMPARISON:  Same-day abdominal radiograph at 0550 hours FINDINGS: Enteric tube tip overlies expected location of the gastric antrum. Multiple gas-filled dilated loops of colon, similar to earlier radiograph. Mild-to-moderate stool burden within the ascending colon. No definite dilated small bowel loops. No free intraperitoneal air given the limits of the supine image. Partially visualized right hip arthroplasty. IMPRESSION: Stable position of the enteric tube, overlying the expected location of the gastric antrum. Unchanged exam compared to  earlier same day radiograph with persistent diffuse gaseous dilatation of the colon. Electronically Signed   By: Ileana Roup M.D.   On: 12/30/2020 15:34   DG Abd Portable 1V  Result Date: 12/30/2020 CLINICAL DATA:  Nausea EXAM: PORTABLE ABDOMEN - 1 VIEW COMPARISON:  04/20/2020 FINDINGS: Feeding tube tip overlies the distal stomach. There is diffuse gaseous distension of the colon and moderate stool burden in the ascending colon and rectum. No clear small bowel dilation. Prior right total hip arthroplasty. Lumbar spine degenerative changes. No acute osseous abnormality. IMPRESSION: Diffuse gaseous distension of the colon with moderate stool burden in the ascending colon and rectum. Feeding tube tip overlies the distal stomach. Electronically Signed   By: Maurine Simmering M.D.   On: 12/30/2020 08:42   DG Abd Portable 1V  Result Date: 12/18/2020 CLINICAL DATA:  Feeding tube placement EXAM: PORTABLE ABDOMEN - 1 VIEW COMPARISON:  09/01/2020 FINDINGS: Feeding tube terminates at the distal stomach. Cardiomegaly with median sternotomy and left base airspace disease. Non-obstructive bowel gas pattern. No gross free intraperitoneal air. IMPRESSION: Feeding tube terminating at the distal stomach. Electronically Signed   By: Abigail Miyamoto M.D.   On: 12/18/2020 16:44   DG Swallowing Func-Speech Pathology  Result Date: 01/01/2021 Table formatting from the original result was not included. 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  External hemorrhoids without mention of complication   H/O aortic valve replacement   H/O mitral valve replacement   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  Lesion of ulnar nerve   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   Other and unspecified hyperlipidemia   Pre-diabetes   Previous back surgery 1978, jan 2007  Psychosexual dysfunction with inhibited sexual excitement   SOB (shortness of breath)   "with heavy exercise"  Stroke ([place]) 08/2013  "I WAS IN AFIB AND THREW A CLOT, THE EFFECTS WERE TRANSITORY AND DIDNT LAST BUT FOR 30 MINUTES"   Thoracic aortic aneurysm  Past Surgical History: Past Surgical History: Procedure Laterality Date  AORTIC AND MITRAL VALVE REPLACEMENT    09/2004  CARDIOVERSION    3 times from 2004-2006  CARDIOVERSION N/A 09/26/2013  Procedure: CARDIOVERSION;  Surgeon: Larey Dresser, MD;  Location: [place];  Service: Cardiovascular;  Laterality: N/A;  CARDIOVERSION N/A 06/19/2014  Procedure: CARDIOVERSION;  Surgeon: Jerline Pain, MD;  Location: [place];  Service: Cardiovascular;  Laterality: N/A;  CARDIOVERSION N/A 04/24/2017  Procedure: CARDIOVERSION;  Surgeon: Larey Dresser, MD;  Location: [place];  Service: Cardiovascular;  Laterality: N/A;  CARDIOVERSION N/A 06/08/2017  Procedure: CARDIOVERSION;  Surgeon: Pixie Casino, MD;  Location: [facility] ENDOSCOPY;  Service: Cardiovascular;  Laterality: N/A;  CARDIOVERSION N/A 08/24/2017  Procedure: CARDIOVERSION;  Surgeon: Skeet Latch, MD;  Location: [facility] ENDOSCOPY;  Service: Cardiovascular;  Laterality: N/A;  CARDIOVERSION N/A 02/14/2018  Procedure: CARDIOVERSION;  Surgeon: Larey Dresser, MD;  Location: [facility] ENDOSCOPY;  Service: Cardiovascular;  Laterality: N/A;  COLONOSCOPY    CRANIOTOMY N/A 07/28/2020  Procedure: CRANIOTOMY FOR EVACUATION OF SUBDURAL HEMATOMA;  Surgeon: Eustace Avitabile, MD;  Location: [place];  Service: Neurosurgery;  Laterality: N/A;  IR ANGIO  VERTEBRAL SEL SUBCLAVIAN INNOMINATE UNI R MOD SED  08/07/2020  IR CT HEAD LTD  08/07/2020  IR PERCUTANEOUS ART THROMBECTOMY/INFUSION INTRACRANIAL INC DIAG ANGIO  08/07/2020  laminectomies    10/1975 and in 03/2005  RADIOLOGY WITH ANESTHESIA N/A 08/07/2020  Procedure: IR WITH ANESTHESIA;  Surgeon: Luanne Bras, MD;  Location: [place];  Service: Radiology;  Laterality: N/A;  TEE WITHOUT CARDIOVERSION N/A 12/18/2020  Procedure: TRANSESOPHAGEAL ECHOCARDIOGRAM (TEE);  Surgeon: Larey Dresser, MD;  Location: [facility] ENDOSCOPY;  Service: Cardiovascular;  Laterality: N/A;  Leslie Right 04/03/2018  Procedure: TOTAL HIP ARTHROPLASTY ANTERIOR APPROACH;  Surgeon: Paralee Cancel, MD;  Location: [facility];  Service: Orthopedics;  Laterality: Right;  70 mins HPI: Pt is a 80 y.o. male who was admitted 10/10 for confusion/decreased mentation, and poor intake, Pt found to have enterococcus faecalis bacteremia, encephalopathy, small left cerebellar vermis CVA, constipation with mild illeus which resolved. 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 Pt has a hx of cognitive deficits and dysphagia, with most recent MBS on 09/02/20 and most recent diet recommendations for Dysphagia 1 solids and honey-thick liquids.  Per family, pt has since advanced to mechanical soft solid and NTL.  Cortrak placed 12/18/20.  Subjective: Pt was lethargic Assessment /  Plan / Recommendation CHL IP CLINICAL IMPRESSIONS 01/01/2021 Clinical Impression Pt presents with oropharyngeal dysphagia characterized by reduced bolus cohesion, reduced pharyngeal constriction, reduced anterior laryngeal movement and a pharyngeal delay. Brief oral holding was noted, but with adequate bolus containment.  Premature spillage and the pharyngeal delay facilitated inconsistent penetration (PAS 5) with larger boluses of thin liquids via straw; however, no other instances of penetration/aspiration were noted during the study. Prompted coughing was ineffective in expelling the penetrate. Trace residue was noted in the pyriform sinuses and on the posterior pharyngeal wall, but this cleared with a liquid wash and secondary swallows. The barium tablet (taken whole with thin liquids) became lodged in the vallecule and subsequently in mid thoracic esophagus. However, transport to the esophagus was facilitated with a puree bolus, and esophageal clearance was achieved with a liquid wash. A dysphagia 3 diet with thin liquids is recommended at this time observance of swallowing precautions. SLP will continue to follow pt. SLP Visit Diagnosis Dysphagia, oropharyngeal phase (R13.12) Attention and concentration deficit following -- Frontal lobe and executive function deficit following -- Impact on safety and function Mild aspiration risk   CHL IP TREATMENT RECOMMENDATION 01/01/2021 Treatment Recommendations Therapy as outlined in treatment plan below   Prognosis 01/01/2021 Prognosis for Safe Diet Advancement Fair Barriers to Reach Goals Cognitive deficits Barriers/Prognosis Comment -- CHL IP DIET RECOMMENDATION 01/01/2021 SLP Diet Recommendations Dysphagia 3 (Mech soft) solids;Thin liquid Liquid Administration via Cup;No straw Medication Administration Crushed with puree Compensations Slow rate;Small sips/bites Postural Changes Seated upright at 90 degrees   CHL IP OTHER RECOMMENDATIONS 01/01/2021 Recommended Consults -- Oral Care Recommendations Oral care BID Other Recommendations --   CHL IP FOLLOW UP RECOMMENDATIONS 01/01/2021 Follow up Recommendations 24 hour supervision/assistance;Skilled Nursing facility   [facility] IP FREQUENCY AND DURATION 01/01/2021 Speech Therapy Frequency (ACUTE ONLY) min 2x/week Treatment Duration 2 weeks       CHL IP ORAL PHASE 01/01/2021 Oral Phase Impaired Oral - Pudding Teaspoon -- Oral - Pudding Cup -- Oral - Honey Teaspoon -- Oral - Honey Cup -- Oral - Nectar Teaspoon -- Oral - Nectar Cup Holding of bolus Oral - Nectar Straw Holding of bolus Oral - Thin Teaspoon -- Oral - Thin Cup Holding of bolus Oral - Thin Straw Holding of bolus;Decreased bolus cohesion;Premature spillage Oral - Puree Holding of bolus Oral - Mech Soft -- Oral - Regular WFL Oral - Multi-Consistency -- Oral - Pill Holding of bolus Oral Phase - Comment --  CHL IP PHARYNGEAL PHASE 01/01/2021 Pharyngeal Phase Impaired Pharyngeal- Pudding Teaspoon -- Pharyngeal -- Pharyngeal- Pudding Cup -- Pharyngeal -- Pharyngeal- Honey Teaspoon -- Pharyngeal -- Pharyngeal- Honey Cup -- Pharyngeal -- Pharyngeal- Nectar Teaspoon -- Pharyngeal -- Pharyngeal- Nectar Cup Pharyngeal residue - pyriform;Reduced anterior laryngeal mobility Pharyngeal -- Pharyngeal- Nectar Straw Pharyngeal residue - pyriform;Reduced anterior laryngeal mobility Pharyngeal -- Pharyngeal- Thin Teaspoon -- Pharyngeal -- Pharyngeal- Thin Cup Pharyngeal residue - pyriform;Reduced anterior laryngeal mobility Pharyngeal -- Pharyngeal- Thin Straw Pharyngeal residue - pyriform;Reduced anterior laryngeal mobility;Penetration/Aspiration during swallow Pharyngeal Material enters airway, CONTACTS cords and not ejected out Pharyngeal- Puree Pharyngeal residue - pyriform;Reduced anterior laryngeal mobility Pharyngeal -- Pharyngeal- Mechanical Soft -- Pharyngeal -- Pharyngeal- Regular Pharyngeal residue - pyriform;Reduced anterior laryngeal mobility Pharyngeal -- Pharyngeal- Multi-consistency -- Pharyngeal -- Pharyngeal- Pill -- Pharyngeal -- Pharyngeal Comment --  CHL IP CERVICAL ESOPHAGEAL PHASE 01/01/2021 Cervical Esophageal Phase WFL Pudding Teaspoon -- Pudding Cup -- Honey Teaspoon -- Honey Cup -- Nectar Teaspoon -- Nectar Cup -- Nectar Straw -- Thin  Teaspoon -- Thin Cup -- Thin Straw -- Puree --  Mechanical Soft -- Regular -- Multi-consistency -- Pill -- Cervical Esophageal Comment -- Shanika I. Hardin Negus, [place], [place] Office number [phone] Pager [place] 01/01/2021, 9:36 AM              EEG adult  Result Date: 12/17/2020 Lora Havens, MD     12/17/2020  1:00 PM Patient Name: [name] [name] MRN: [id number] Epilepsy Attending: Lora Havens Referring Physician/Provider: Dr Oren Binet Date: 12/17/2020 Duration: 24.04 mins Patient history: 80 year old male with recent subdural hematoma in May, seizures who presented with altered mental status.  EEG to evaluate for seizures. Level of alertness: Awake AEDs during EEG study: LEV Technical aspects: This EEG study was done with scalp electrodes positioned according to the 10-20 International system of electrode placement. Electrical activity was acquired at a sampling rate of _0  and reviewed with a high frequency filter of _1  and a low frequency filter of _2 . EEG data were recorded continuously and digitally stored. Description: The posterior dominant rhythm consists of 8-9 Hz activity of moderate voltage (25-35 uV) seen predominantly in posterior head regions, symmetric and reactive to eye opening and eye closing. EEG showed continuous low amplitude 2 to 3 Hz delta slowing in right frontotemporal region. Intermittent generalized 3 to 5 Hz theta-delta slowing was also noted. Hyperventilation and photic stimulation were not performed.   ABNORMALITY - Continuous slow, right frontotemporal region - Intermittent slow, generalized IMPRESSION: This study is suggestive of cortical dysfunction in right frontotemporal region likely secondary to underlying structural abnormality/stroke/subdural hemorrhage.  Additionally there is mild diffuse encephalopathy, nonspecific etiology.  No seizures or epileptiform discharges were seen throughout the recording.  If suspicion for ictal- interictal activity  remains a concern, a prolonged study can be considered. Priyanka Barbra Sarks   Overnight EEG with video  Result Date: 12/18/2020 Lora Havens, MD     12/19/2020  7:36 AM Patient Name: [name] [name] MRN: [id number] Epilepsy Attending: Lora Havens Referring Physician/Provider: Dr Oren Binet Duration: 12/17/2020 1638 to 12/18/2020 1002  Patient history: 80 year old male with recent subdural hematoma in May, seizures who presented with altered mental status.  EEG to evaluate for seizures.  Level of alertness: Awake, asleep  AEDs during EEG study: LEV  Technical aspects: This EEG study was done with scalp electrodes positioned according to the 10-20 International system of electrode placement. Electrical activity was acquired at a sampling rate of _3  and reviewed with a high frequency filter of _4  and a low frequency filter of _5 . EEG data were recorded continuously and digitally stored.  Description: The posterior dominant rhythm consists of 8-9 Hz activity of moderate voltage (25-35 uV) seen predominantly in posterior head regions, symmetric and reactive to eye opening and eye closing.  Sleep was characterized by vertex waves, sleep spindles (12 to 14 Hz), maximal frontocentral region.  EEG showed continuous low amplitude 2 to 3 Hz delta slowing in right frontotemporal region. Hyperventilation and photic stimulation were not performed.    ABNORMALITY - Continuous slow, right frontotemporal region  IMPRESSION: This study is suggestive of cortical dysfunction in right frontotemporal region likely secondary to underlying structural abnormality/stroke/subdural hemorrhage. No seizures or epileptiform discharges were seen throughout the recording. Lora Havens   ECHOCARDIOGRAM COMPLETE  Result Date: 12/15/2020    ECHOCARDIOGRAM REPORT   Patient Name:   [name] [name] Date of Exam: 12/15/2020 Medical Rec #:  [id number]  Height:       67.0 in Accession #:    [id number]    Weight:       166.2 lb  Date of Birth:  [birth year]     BSA:          1.869 m Patient Age:    48 years      BP:           120/67 mmHg Patient Gender: M             HR:           98 bpm. Exam Location:  Inpatient Procedure: 2D Echo, Cardiac Doppler, Color Doppler and Intracardiac            Opacification Agent Indications:    Fever R50.9  History:        Patient has prior history of Echocardiogram examinations, most                 recent 08/07/2020. Stroke; Arrythmias:Atrial Fibrillation. Past                 history of rheumatic fever, acute rheumatic pericarditis.                 Thoracic aortic aneurysm.                 Aortic Valve: 27 mm Carpentier-Edwards prosthetic valve is                 present in the aortic position. Procedure Date: 10/18/2004.  Sonographer:    Darlina Sicilian RDCS Sonographer#2:  Darlina Sicilian RDCS Referring Phys: [id number] [place]  1. The mitral valve has been replaced with an unknown bioprosthesis. No evidence of mitral valve regurgitation. The mean mitral valve gradient is 10.0 mmHg with average heart rate of 101 bpm. PHT 70 ms. Suboptimal LVOT spectral Doppler- VTI deferred. Date of Procedure Date: 10/18/2004.  2. The aortic valve has been repaired/replaced. Aortic valve regurgitation is not visualized. 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 Procedure Date: 10/18/2004. Aortic valve mean gradient measures 16.0 mmHg.  Aortic valve acceleration time measures 74 msec.  3. Left ventricular ejection fraction, by estimation, is 65 to 70%. The left ventricle has normal function. The left ventricle has no regional wall motion abnormalities. There is mild concentric left ventricular hypertrophy. Left ventricular diastolic parameters are indeterminate.  4. Aortic dilatation noted. 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  5. Right ventricular systolic function  is normal. The right ventricular size is normal. Comparison(s): A prior study was performed on 08/07/2020. Prior images reviewed side by side. Stable aortic root and ascending aorta size. Increased mitral valve gradients, but at higher hear rates from prior. FINDINGS  Left Ventricle: Left ventricular ejection fraction, by estimation, is 65 to 70%. The left ventricle has normal function. The left ventricle has no regional wall motion abnormalities. Definity contrast agent was given IV to delineate the left ventricular  endocardial borders. The left ventricular internal cavity size was normal in size. There is mild concentric left ventricular hypertrophy. Left ventricular diastolic parameters are indeterminate. Right Ventricle: The right ventricular size is normal. No increase in right ventricular wall thickness. Right ventricular systolic function is normal. Left Atrium: Left atrial size was normal in size. Right Atrium: Right atrial size was normal in size. Pericardium: There is no evidence of  pericardial effusion. Mitral Valve: The mitral valve has been repaired/replaced. No evidence of mitral valve regurgitation. There is a 29 mm Carpentier-Edwards prosthetic present in the mitral position. MV peak gradient, 17.1 mmHg. The mean mitral valve gradient is 10.0 mmHg with average heart rate of 101 bpm. Tricuspid Valve: The tricuspid valve is grossly normal. Tricuspid valve regurgitation is mild . No evidence of tricuspid stenosis. Aortic Valve: The aortic valve has been repaired/replaced. Aortic valve regurgitation is not visualized. Aortic valve mean gradient measures 16.0 mmHg. Aortic valve peak gradient measures 24.4 mmHg. Aortic valve area, by VTI measures 1.56 cm. There is a  27 mm Carpentier-Edwards prosthetic valve present in the aortic position. Procedure Date: 10/18/2004. Pulmonic Valve: The pulmonic valve was normal in structure. Pulmonic valve regurgitation is mild. No evidence of pulmonic stenosis. Aorta:  The ascending aorta was not well visualized and aortic dilatation noted. 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 IAS/Shunts: The atrial septum is grossly normal.  LEFT VENTRICLE PLAX 2D LVIDd:         4.80 cm LVIDs:         3.50 cm LV PW:         1.30 cm LV IVS:        1.30 cm LVOT diam:     3.25 cm LV SV:         62 LV SV Index:   33 LVOT Area:     8.30 cm  LV Volumes (MOD) LV vol d, MOD A2C: 81.2 ml LV vol d, MOD A4C: 94.4 ml LV vol s, MOD A2C: 18.0 ml LV vol s, MOD A4C: 30.1 ml LV SV MOD A2C:     63.2 ml LV SV MOD A4C:     94.4 ml LV SV MOD BP:      63.9 ml RIGHT VENTRICLE RV S prime:     10.40 cm/s LEFT ATRIUM             Index        RIGHT ATRIUM          Index LA diam:        4.50 cm 2.41 cm/m   RA Area:     8.74 cm LA Vol (A2C):   58.1 ml 31.08 ml/m  RA Volume:   12.60 ml 6.74 ml/m LA Vol (A4C):   56.9 ml 30.44 ml/m LA Biplane Vol: 58.9 ml 31.51 ml/m  AORTIC VALVE AV Area (Vmax):    5.29 cm AV Area (Vmean):   1.55 cm AV Area (VTI):     1.56 cm AV Vmax:           247.00 cm/s AV Vmean:          190.500 cm/s AV VTI:            0.401 m AV Peak Grad:      24.4 mmHg AV Mean Grad:      16.0 mmHg LVOT Vmax:         157.40 cm/s LVOT Vmean:        35.700 cm/s LVOT VTI:          0.075 m LVOT/AV VTI ratio: 0.19  AORTA Ao Asc diam: 4.90 cm MITRAL VALVE                TRICUSPID VALVE MV Area (PHT): 3.16 cm     TR Peak grad:   26.4 mmHg  MV Area VTI:   1.39 cm     TR Vmax:        257.00 cm/s MV Peak grad:  17.1 mmHg MV Mean grad:  10.0 mmHg    SHUNTS MV Vmax:       2.07 m/s     Systemic VTI:  0.08 m MV Vmean:      151.0 cm/s   Systemic Diam: 3.25 cm MV Decel Time: 240 msec MV E velocity: 195.00 cm/s Rudean Haskell MD Electronically signed by Rudean Haskell MD Signature Date/Time: 12/15/2020/12:49:20 PM    Final    [place] EKG SITE RITE  Result Date: 01/01/2021 If Site Rite image not attached, placement  could not be confirmed due to current cardiac rhythm.    [name]   Subjective    [name] [name] has no headache,no chest abdominal pain,no new weakness tingling or numbness, feels much better.   Objective   Blood pressure 126/72, pulse 71, temperature 98 F (36.7 C), temperature source Axillary, resp. rate (!) 22, height _0  (1.702 m), weight 85.2 kg, SpO2 94 %.   Intake/Output Summary (Last 24 hours) at 01/04/2021 1420 Last data filed at 01/04/2021 0814 Gross per 24 hour  Intake 120 ml  Output 1400 ml  Net -1280 ml    Exam  Awake Alert, L leg 0-1/5, L arm 2/5, dysarthria, expressive aphasia  [place].AT,PERRAL Supple Neck,No JVD, No cervical lymphadenopathy appriciated.  Symmetrical Chest wall movement, Good air movement bilaterally, CTAB RRR,No Gallops,Rubs or new Murmurs, No Parasternal Heave +ve B.Sounds, Abd Soft, Non tender,   No Cyanosis, Clubbing       Data Review   CBC w Diff:  Lab Results  Component Value Date   WBC 10.3 01/02/2021   HGB 7.9 (L) 01/02/2021   HGB 12.1 11/26/2013   HCT 25.0 (L) 01/02/2021   HCT 36 11/26/2013   PLT 254 01/02/2021   PLT 242 11/26/2013   LYMPHOPCT 19 12/16/2020   LYMPHOPCT 17 11/26/2013   MONOPCT 7 12/16/2020   MONOPCT 13 11/26/2013   EOSPCT 1 12/16/2020   EOSPCT 1 11/26/2013   BASOPCT 0 12/16/2020    CMP:  Lab Results  Component Value Date   NA 138 01/03/2021   NA 150 (A) 12/13/2020   K 3.7 01/03/2021   K 4.1 11/26/2013   CL 109 01/03/2021   CL 104 11/26/2013   CO2 23 01/03/2021   CO2 22 11/26/2013   BUN 17 01/03/2021   BUN 29 (A) 12/13/2020   CREATININE 0.93 01/03/2021   CREATININE 1.16 07/17/2015   GLU 136 12/13/2020   PROT 6.3 (L) 01/03/2021   ALBUMIN 1.9 (L) 01/03/2021   ALBUMIN 3.6 11/26/2013   BILITOT 0.4 01/03/2021   BILITOT 0.7 11/26/2013   ALKPHOS 94 01/03/2021   ALKPHOS 78 11/26/2013   AST 32 01/03/2021   AST 63 11/26/2013   ALT 63 (H) 01/03/2021   ALT 77 11/26/2013  .   Total Time in  preparing paper work, data evaluation and todays exam - 47 minutes  Lala Lund M.D on 01/04/2021 at 2:20 PM  [facility]

## 2021-01-05 LAB — GLUCOSE, CAPILLARY
Glucose-Capillary: 88 mg/dL (ref 70–99)
Glucose-Capillary: 112 mg/dL — ABNORMAL HIGH (ref 70–99)

## 2021-01-05 NOTE — Care Management Important Message (Signed)
Important Message  Patient Details  Name: [name] [name] MRN: [id number] Date of Birth: May 03, 1940   Medicare Important Message Given:  Yes  Patient left prior to IM delivery will mail to the home address.    [name] [name] 01/05/2021, 4:10 PM

## 2021-01-05 NOTE — Progress Notes (Signed)
   01/05/21 1124  AVS Discharge Documentation  AVS Discharge Instructions Including Medications Provided to patient/caregiver;Placed in discharge packet for receiving facility  Name of Person Receiving AVS Discharge Instructions Including Medications Amy, RN at [phone]  Name of Clinician That Reviewed AVS Discharge Instructions Including Medications Venida Jarvis, RN

## 2021-01-05 NOTE — Progress Notes (Signed)
[facility]                                                                                                                                                                         Patient Demographics  [name] [name], is a 80 y.o. male  BPZ:[id number]  EUM:[id number]  DOB - Jan 17, 1941  Admit date - 12/14/2020  Admitting Physician Georgette Shell, MD  Outpatient Primary MD for the patient is [name] Lima, MD  LOS - 21   Chief Complaint  Patient presents with   Altered Mental Status        Assessment & Plan    Patient seen briefly today due for discharge soon per Discharge done yesterday by me, no further issues, Vital signs stable, patient feels fine.      Medications  Scheduled Meds:  amantadine  200 mg Oral BID   amLODipine  10 mg Oral Daily   apixaban  5 mg Oral BID   atorvastatin  80 mg Oral Daily   bisacodyl  10 mg Rectal Once   bisacodyl  10 mg Rectal QODAY   Chlorhexidine Gluconate Cloth  6 each Topical Daily   diclofenac Sodium  4 g Topical QID   docusate  100 mg Oral BID   dofetilide  375 mcg Oral BID   doxazosin  1 mg Oral Daily   famotidine  20 mg Oral Daily   finasteride  5 mg Oral Daily   fluocinonide cream   Topical BID   insulin aspart  0-9 Units Subcutaneous TID WC   lacosamide  50 mg Oral BID   magnesium gluconate  500 mg Oral Daily   mouth rinse  15 mL Mouth Rinse BID   multivitamin with minerals  1 tablet Oral Daily   sodium chloride flush  10-40 mL Intracatheter Q12H   Continuous Infusions:  ampicillin (OMNIPEN) IV 2 g (01/05/21 5400)   cefTRIAXone (ROCEPHIN)  IV 2 g (01/04/21 2233)   PRN Meds:.ondansetron (ZOFRAN) IV, sodium chloride flush    Time Spent in minutes   10 minutes   Lala Lund M.D on 01/05/2021 at 8:37 AM  Between 7am to 7pm - Pager - [phone]  After 7pm go to [email or web address] - password [email or web address]  And look for the night coverage  person covering for me after hours  [facility] Office  [phone]    Subjective:   Jocob [name] today has, No headache, No chest pain, No abdominal pain - No Nausea, No new weakness tingling or numbness, No Cough - SOB.   Objective:   Vitals:   01/04/21 2341 01/05/21 0351 01/05/21 0500 01/05/21 0749  BP: 128/67 129/79  124/85  Pulse: 70 73  68  Resp: (!) 21  20  (!) 23  Temp: 99.1 F (37.3 C) 98.4 F (36.9 C)  98.1 F (36.7 C)  TempSrc: Axillary Axillary  Axillary  SpO2: 97% 93%  95%  Weight:   87.5 kg   Height:        Wt Readings from Last 3 Encounters:  01/05/21 87.5 kg  12/14/20 75.4 kg  12/10/20 75.3 kg     Intake/Output Summary (Last 24 hours) at 01/05/2021 0837 Last data filed at 01/04/2021 1822 Gross per 24 hour  Intake 240 ml  Output 1800 ml  Net -1560 ml    Exam  Awake Alert, L leg 0-1/5, L arm 2/5, dysarthria, expressive aphasia  .AT,PERRAL Supple Neck,No JVD, No cervical lymphadenopathy appriciated.  Symmetrical Chest wall movement, Good air movement bilaterally, CTAB RRR,No Gallops,Rubs or new Murmurs, No Parasternal Heave +ve B.Sounds, Abd Soft, Non tender,   No Cyanosis, Clubbing

## 2021-01-05 NOTE — TOC CM/SW Note (Addendum)
1122 am [facility] Pharmacist, Andrienne contacted facility director, Manuela Schwartz to clarify IV abx.  Jonnie Finner RN3 CCM, Heart Failure TOC CM [phone]   1101 am HF TOC CM received call from [place], Conservation officer, historic buildings. She need clarification on IV abx and times. Able to provide time on Rocephin. Contacted Unit RN and he will have attending updated information on Ampicillin. He will call report to RN on duty, Amy #[phone] and give her update on IV abx. [place], Heart Failure TOC CM [phone]

## 2021-01-05 NOTE — TOC Transition Note (Signed)
Transition of Care [facility]) - CM/SW Discharge Note   Patient Details  Name: UNIQUE SEARFOSS MRN: [id number] Date of Birth: 21-Nov-1940  Transition of Care [facility]) CM/SW Contact:  Echo, [place] Phone Number: 01/05/2021, 10:54 AM   Clinical Narrative:    Patient will DC to: [facility] Anticipated DC date: 01/05/2021 Family notified: Yes Transport by: Ocie Cornfield   Per MD patient ready for DC to [place], SNF. RN to call report prior to discharge [phone] room# 158). RN, patient, patient's family, and facility notified of DC. Discharge Summary and FL2 sent to facility. DC packet on chart. Ambulance transport requested for patient eta roughly noon/12pm.  CSW will sign off for now as social work intervention is no longer needed. Please consult [place] again if new needs arise.      Final next level of care: [place] Barriers to Discharge: No Barriers Identified   Patient Goals and CMS Choice Patient states their goals for this hospitalization and ongoing recovery are:: [facility] to [email or web address] Compare Post Acute Care list provided to:: Patient Choice offered to / list presented to : Patient  Discharge Placement PASRR number recieved: 01/05/21            Patient chooses bed at: [facility] Patient to be transferred to facility by: [facility] at noon Name of family member notified: Spouse Kain Milosevic [phone] Patient and family notified of of transfer: 01/05/21  Discharge Plan and Services In-house Referral: Clinical Social Work   Post Acute Care Choice: [place]                               Social Determinants of Health (SDOH) Interventions     Readmission Risk Interventions No flowsheet data found.    Zenna Traister, MSW, [place] Heart Failure Social Worker

## 2021-01-06 DIAGNOSIS — I5032 Chronic diastolic (congestive) heart failure: Secondary | ICD-10-CM | POA: Diagnosis not present

## 2021-01-06 DIAGNOSIS — I69391 Dysphagia following cerebral infarction: Secondary | ICD-10-CM | POA: Diagnosis not present

## 2021-01-06 DIAGNOSIS — R278 Other lack of coordination: Secondary | ICD-10-CM | POA: Diagnosis not present

## 2021-01-06 DIAGNOSIS — R601 Generalized edema: Secondary | ICD-10-CM | POA: Diagnosis not present

## 2021-01-06 DIAGNOSIS — R2689 Other abnormalities of gait and mobility: Secondary | ICD-10-CM | POA: Diagnosis not present

## 2021-01-06 DIAGNOSIS — H538 Other visual disturbances: Secondary | ICD-10-CM | POA: Diagnosis not present

## 2021-01-06 DIAGNOSIS — S065X3S Traumatic subdural hemorrhage with loss of consciousness of 1 hour to 5 hours 59 minutes, sequela: Secondary | ICD-10-CM | POA: Diagnosis not present

## 2021-01-06 DIAGNOSIS — R41841 Cognitive communication deficit: Secondary | ICD-10-CM | POA: Diagnosis not present

## 2021-01-06 DIAGNOSIS — R41840 Attention and concentration deficit: Secondary | ICD-10-CM | POA: Diagnosis not present

## 2021-01-06 DIAGNOSIS — I6601 Occlusion and stenosis of right middle cerebral artery: Secondary | ICD-10-CM | POA: Diagnosis not present

## 2021-01-06 DIAGNOSIS — M5459 Other low back pain: Secondary | ICD-10-CM | POA: Diagnosis not present

## 2021-01-06 DIAGNOSIS — M6389 Disorders of muscle in diseases classified elsewhere, multiple sites: Secondary | ICD-10-CM | POA: Diagnosis not present

## 2021-01-06 DIAGNOSIS — I10 Essential (primary) hypertension: Secondary | ICD-10-CM | POA: Diagnosis not present

## 2021-01-07 ENCOUNTER — Non-Acute Institutional Stay (SKILLED_NURSING_FACILITY): Payer: Medicare Other | Admitting: Adult Health

## 2021-01-07 ENCOUNTER — Encounter: Payer: Self-pay | Admitting: Adult Health

## 2021-01-07 DIAGNOSIS — K5901 Slow transit constipation: Secondary | ICD-10-CM

## 2021-01-07 DIAGNOSIS — I69354 Hemiplegia and hemiparesis following cerebral infarction affecting left non-dominant side: Secondary | ICD-10-CM

## 2021-01-07 DIAGNOSIS — I5032 Chronic diastolic (congestive) heart failure: Secondary | ICD-10-CM | POA: Diagnosis not present

## 2021-01-07 DIAGNOSIS — R569 Unspecified convulsions: Secondary | ICD-10-CM | POA: Diagnosis not present

## 2021-01-07 DIAGNOSIS — I38 Endocarditis, valve unspecified: Secondary | ICD-10-CM

## 2021-01-07 DIAGNOSIS — I69391 Dysphagia following cerebral infarction: Secondary | ICD-10-CM | POA: Diagnosis not present

## 2021-01-07 DIAGNOSIS — G9341 Metabolic encephalopathy: Secondary | ICD-10-CM

## 2021-01-07 DIAGNOSIS — I482 Chronic atrial fibrillation, unspecified: Secondary | ICD-10-CM | POA: Diagnosis not present

## 2021-01-07 DIAGNOSIS — E118 Type 2 diabetes mellitus with unspecified complications: Secondary | ICD-10-CM

## 2021-01-07 DIAGNOSIS — H538 Other visual disturbances: Secondary | ICD-10-CM | POA: Diagnosis not present

## 2021-01-07 DIAGNOSIS — S065X3S Traumatic subdural hemorrhage with loss of consciousness of 1 hour to 5 hours 59 minutes, sequela: Secondary | ICD-10-CM | POA: Diagnosis not present

## 2021-01-07 DIAGNOSIS — R41841 Cognitive communication deficit: Secondary | ICD-10-CM | POA: Diagnosis not present

## 2021-01-07 DIAGNOSIS — E8809 Other disorders of plasma-protein metabolism, not elsewhere classified: Secondary | ICD-10-CM | POA: Diagnosis not present

## 2021-01-07 DIAGNOSIS — M6389 Disorders of muscle in diseases classified elsewhere, multiple sites: Secondary | ICD-10-CM | POA: Diagnosis not present

## 2021-01-07 DIAGNOSIS — I6601 Occlusion and stenosis of right middle cerebral artery: Secondary | ICD-10-CM | POA: Diagnosis not present

## 2021-01-07 DIAGNOSIS — R7401 Elevation of levels of liver transaminase levels: Secondary | ICD-10-CM

## 2021-01-07 DIAGNOSIS — R1312 Dysphagia, oropharyngeal phase: Secondary | ICD-10-CM | POA: Diagnosis not present

## 2021-01-07 DIAGNOSIS — I10 Essential (primary) hypertension: Secondary | ICD-10-CM | POA: Diagnosis not present

## 2021-01-07 DIAGNOSIS — R2689 Other abnormalities of gait and mobility: Secondary | ICD-10-CM | POA: Diagnosis not present

## 2021-01-07 DIAGNOSIS — R0602 Shortness of breath: Secondary | ICD-10-CM

## 2021-01-07 DIAGNOSIS — R278 Other lack of coordination: Secondary | ICD-10-CM | POA: Diagnosis not present

## 2021-01-07 DIAGNOSIS — D638 Anemia in other chronic diseases classified elsewhere: Secondary | ICD-10-CM | POA: Diagnosis not present

## 2021-01-07 DIAGNOSIS — T826XXD Infection and inflammatory reaction due to cardiac valve prosthesis, subsequent encounter: Secondary | ICD-10-CM

## 2021-01-07 DIAGNOSIS — R41840 Attention and concentration deficit: Secondary | ICD-10-CM | POA: Diagnosis not present

## 2021-01-07 DIAGNOSIS — R601 Generalized edema: Secondary | ICD-10-CM | POA: Diagnosis not present

## 2021-01-07 DIAGNOSIS — M5459 Other low back pain: Secondary | ICD-10-CM | POA: Diagnosis not present

## 2021-01-07 NOTE — Progress Notes (Signed)
Location:  [facility] Room Number: 158-A Place of Service:  SNF [phone]) Provider:  Royal Hawthorn, NP   Patient Care Team: [name], MD as PCP - General (Internal Medicine) Larey Dresser, MD as PCP - Cardiology (Cardiology) [name], MD as Consulting Physician (Ophthalmology) [name], MD (Internal Medicine)  Extended Emergency Contact Information Primary Emergency Contact: [name] Address: [address], [place] [address] [name] of [place] Phone: [phone] Relation: Spouse Secondary Emergency Contact: [name], [place] [address] [name] of [place] Phone: [phone] Relation: Son  Code Status:  DNR Goals of care: Advanced Directive information Advanced Directives 01/07/2021  Does Patient Have a Medical Advance Directive? Yes  Type of Paramedic of [place];Living will;Out of facility DNR (pink MOST or yellow form)  Does patient want to make changes to medical advance directive? No - Patient declined  Copy of [place] in Chart? Yes - validated most recent copy scanned in chart (See row information)  Would patient like information on creating a medical advance directive? -  Pre-existing out of facility DNR order (yellow form or pink MOST form) -     Chief Complaint  Patient presents with   Hospitalization Follow-up    Follow-up from recent hospital stay.     HPI:  Pt is a 80 y.o. male PMH significant for syncope with fall in May of 2022, TBI with SDH with craniotomy, acute right MCA s/p thrombectomy, mitral and aortic valve replacement, DM II, and afib.  Seen today for a hospital f/u s/p admission from 12/14/20-01/04/21 for acute metabolic encephalopathy due to hypernatremia and enterococcal bacteremia with prosthetic valve endocarditis. Blood cx grew enterococcus faecalis and urine culture grew staph aureus. Repeat  Blood cx 10/17 negative. He was treated with Rocephin and ampicillin with a stop date of 01/28/21 and then he will take amoxicillin 500 mg bid indefinitely. His mental status improved. He is able to talk and answer questions, although he is profoundly weak and needs assistance with all ADLs and is non ambulatory with a condom cath. He has a PICC line in the RUE. During his stay he was also found to have an acute left cerebellar vermis CVA felt to be due to septic emboli.  He was changed from [place] to Vimpat for seizure prevention. No new events.  Has transaminitis which is improving felt to be due to acute illness and statin. Also has Albumin of 1.9.  Nutritionally prior to admit he was losing weight and required a [place] feeding tube during his hospitalizatoin. His appetite improved. He is gaining weight. Dealing with edema in his upper ext, abd, and thighs. Having some mild sob at rest and his wife heard a slight wheeze. He also had issues with constipation and vomited and required laxatives during his hospitalization. Had a BM each day since arriving to [facility].  Wt Readings from Last 3 Encounters:  01/07/21 188 lb (85.3 kg)  01/05/21 192 lb 14.4 oz (87.5 kg)  12/14/20 166 lb 3.2 oz (75.4 kg)    Had three low grade temps, 99.4 this am, now 98.1  BNP 229.4 01/02/21 Hgb 7. 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    Other and unspecified hyperlipidemia    Pre-diabetes    Previous back surgery 1978,  jan 2007   Psychosexual dysfunction with inhibited sexual excitement    SOB (shortness of breath)    "with heavy exercise"   Stroke ([place]) 08/2013   "I WAS IN AFIB AND THREW A CLOT, THE EFFECTS WERE TRANSITORY AND DIDNT LAST BUT FOR 30 MINUTES"    Thoracic aortic aneurysm    Past Surgical History:  Procedure Laterality Date   AORTIC AND MITRAL VALVE REPLACEMENT     09/2004   CARDIOVERSION     3 times from 2004-2006   CARDIOVERSION N/A 09/26/2013   Procedure: CARDIOVERSION;  Surgeon: Larey Dresser, MD;  Location: [facility];  Service: Cardiovascular;  Laterality: N/A;   CARDIOVERSION N/A 06/19/2014   Procedure: CARDIOVERSION;  Surgeon: Jerline Pain, MD;  Location: [facility];  Service: Cardiovascular;  Laterality: N/A;   CARDIOVERSION N/A 04/24/2017   Procedure: CARDIOVERSION;  Surgeon: Larey Dresser, MD;  Location: [facility];  Service: Cardiovascular;  Laterality: N/A;   CARDIOVERSION N/A 06/08/2017   Procedure: CARDIOVERSION;  Surgeon: Pixie Casino, MD;  Location: [facility] ENDOSCOPY;  Service: Cardiovascular;  Laterality: N/A;   CARDIOVERSION N/A 08/24/2017   Procedure: CARDIOVERSION;  Surgeon: Skeet Latch, MD;  Location: [facility] ENDOSCOPY;  Service: Cardiovascular;  Laterality: N/A;   CARDIOVERSION N/A 02/14/2018   Procedure: CARDIOVERSION;  Surgeon: Larey Dresser, MD;  Location: [facility] ENDOSCOPY;  Service: Cardiovascular;  Laterality: N/A;   COLONOSCOPY     CRANIOTOMY N/A 07/28/2020   Procedure: CRANIOTOMY FOR EVACUATION OF SUBDURAL HEMATOMA;  Surgeon: Eustace Alcindor, MD;  Location: [facility];  Service: Neurosurgery;  Laterality: N/A;   IR ANGIO VERTEBRAL SEL SUBCLAVIAN INNOMINATE UNI R MOD SED  08/07/2020   IR CT HEAD LTD  08/07/2020   IR PERCUTANEOUS ART THROMBECTOMY/INFUSION INTRACRANIAL INC DIAG ANGIO  08/07/2020   laminectomies     10/1975 and in 03/2005   RADIOLOGY WITH ANESTHESIA N/A 08/07/2020   Procedure: IR WITH ANESTHESIA;  Surgeon: Luanne Bras, MD;  Location: [facility];   Service: Radiology;  Laterality: N/A;   TEE WITHOUT CARDIOVERSION N/A 12/18/2020   Procedure: TRANSESOPHAGEAL ECHOCARDIOGRAM (TEE);  Surgeon: Larey Dresser, MD;  Location: [facility] ENDOSCOPY;  Service: Cardiovascular;  Laterality: N/A;   [place] Right 04/03/2018   Procedure: TOTAL HIP ARTHROPLASTY ANTERIOR APPROACH;  Surgeon: Paralee Cancel, MD;  Location: [facility];  Service: Orthopedics;  Laterality: Right;  70 mins    Allergies  Allergen Reactions   Naproxen Hives   No [facility] Food Allergies Other (See Comments)    Scallops - distress, nausea and vomitting    Outpatient Encounter Medications as of 01/07/2021  Medication Sig   amantadine (SYMMETREL) 50 MG/5ML solution 8 AM and 2 PM   amLODipine (NORVASC) 10 MG tablet Take 1 tablet (10 mg total) by mouth daily.   ampicillin IVPB Inject 12 g into the vein daily. As a continuous infusion. Indication:  Enterococcus endocarditis  First Dose: Yes Last  Day of Therapy:  01/28/21 Labs - Once weekly:  CBC/D and BMP, Labs - Every other week:  ESR and CRP Method of administration: Ambulatory Pump (Continuous Infusion) Method of administration may be changed at the discretion of home infusion pharmacist based upon assessment of the patient and/or caregiver's ability to self-administer the medication ordered.   apixaban (ELIQUIS) 5 MG TABS tablet Take 1 tablet (5 mg total) by mouth 2 (two) times daily.   atorvastatin (LIPITOR) 80 MG tablet Take 1 tablet (80 mg total) by mouth daily.   bisacodyl (DULCOLAX) 10 MG suppository Place 1 suppository (10 mg total) rectally daily as needed for moderate constipation.   cefTRIAXone (ROCEPHIN) IVPB Inject 2 g into the vein every 12 (twelve) hours. Indication:  Enterococcal endocarditis  First Dose: Yes Last Day of Therapy:  01/28/21 Labs - Once weekly:  CBC/D and BMP, Labs - Every other week:  ESR and CRP Method of administration: IV Push Method of administration may be  changed at the discretion of home infusion pharmacist based upon assessment of the patient and/or caregiver's ability to self-administer the medication ordered.   clobetasol cream (TEMOVATE) 4.28 % Apply 1 application topically daily as needed (for skin irritation).    docusate sodium (COLACE) 100 MG capsule Take 1 capsule (100 mg total) by mouth 2 (two) times daily.   dofetilide (TIKOSYN) 125 MCG capsule Take 3 capsules (375 mcg total) by mouth 2 (two) times daily.   famotidine (PEPCID) 20 MG tablet Take 20 mg by mouth at bedtime as needed.   feeding supplement, GLUCERNA SHAKE, (GLUCERNA SHAKE) LIQD Take 237 mLs by mouth 2 (two) times daily between meals. For weight loss   finasteride (PROSCAR) 5 MG tablet Take 1 tablet (5 mg total) by mouth daily.   hydrocortisone cream 1 % Apply topically 2 (two) times daily as needed for itching (rash).   lacosamide (VIMPAT) 50 MG TABS tablet Take 1 tablet (50 mg total) by mouth 2 (two) times daily.   loratadine (CLARITIN) 10 MG tablet Take 10 mg by mouth at bedtime.   magnesium gluconate (MAGONATE) 500 MG tablet Take 1 tablet (500 mg total) by mouth daily.   Multiple Vitamin (MULTIVITAMIN WITH MINERALS) TABS tablet Take 1 tablet by mouth daily.   polyethylene glycol (MIRALAX / GLYCOLAX) 17 g packet Take 17 g by mouth daily as needed.   polyvinyl alcohol (LIQUIFILM TEARS) 1.4 % ophthalmic solution Place 1 drop into both eyes 2 (two) times daily.   tamsulosin (FLOMAX) 0.4 MG CAPS capsule Take 0.4 mg by mouth daily.   No facility-administered encounter medications on file as of 01/07/2021.    Review of Systems  Constitutional:  Positive for activity change. Negative for appetite change, chills, diaphoresis, fatigue, fever and unexpected weight change.  HENT:  Negative for congestion and trouble swallowing.   Respiratory:  Positive for shortness of breath. Negative for cough, wheezing and stridor.   Cardiovascular:  Negative for chest pain, palpitations and  leg swelling.  Gastrointestinal:  Positive for constipation. Negative for abdominal distention, abdominal pain and diarrhea.  Genitourinary:  Negative for difficulty urinating and dysuria.  Musculoskeletal:  Positive for gait problem. Negative for arthralgias, back pain, joint swelling and myalgias.  Skin:  Positive for pallor.  Neurological:  Positive for weakness. Negative for dizziness, seizures, syncope, facial asymmetry, speech difficulty and headaches.  Hematological:  Negative for adenopathy. Does not bruise/bleed easily.  Psychiatric/Behavioral:  Positive for confusion. Negative for agitation and behavioral problems.    Immunization History  Administered Date(s) Administered   Fluad Quad(high Dose 65+) 12/06/2018, 12/09/2020   Influenza Whole 12/25/2008, 01/05/2010, 12/30/2011   Influenza, High Dose Seasonal PF 12/14/2016   Influenza,inj,Quad PF,6+ Mos 12/19/2013   Influenza,inj,quad, With Preservative 11/22/2017   Influenza-Unspecified 12/14/2015, 12/24/2019   Moderna SARS-COV2 Booster Vaccination 01/02/2020, 06/26/2020   Moderna Sars-Covid-2 Vaccination 04/08/2019, 05/05/2019   Pneumococcal Conjugate-13 12/19/2013   Pneumococcal Polysaccharide-23 09/24/2007, 05/22/2017   Td 12/25/2008   Tdap 03/22/2018   Zoster Recombinat (Shingrix) 07/16/2019, 10/28/2019   Zoster, Live 09/24/2007   Pertinent  Health Maintenance Due  Topic Date Due   FOOT EXAM  Never done   URINE MICROALBUMIN  Never done   OPHTHALMOLOGY EXAM  04/07/2021 (Originally 10/04/1950)   HEMOGLOBIN A1C  06/17/2021   INFLUENZA VACCINE  Completed   Fall Risk 01/03/2021 01/03/2021 01/04/2021 01/04/2021 01/05/2021  Falls in the past year? - - - - -  Was there an injury with Fall? - - - - -  Fall Risk Category Calculator - - - - -  Fall Risk Category - - - - -  Patient Fall Risk Level _0   Patient at Risk for Falls Due to - - - - -  Patient at  Risk for Falls Due to - - - - -  Fall risk Follow up - - - - -   Functional Status Survey:    Vitals:   01/07/21 0932  BP: (!) 144/88  Pulse: 73  Resp: 18  Temp: 99.4 F (37.4 C)  SpO2: 95%  Weight: 188 lb (85.3 kg)  Height: _1  (1.702 m)   Body mass index is 29.44 kg/m. Wt Readings from Last 3 Encounters:  01/07/21 188 lb (85.3 kg)  01/05/21 192 lb 14.4 oz (87.5 kg)  12/14/20 166 lb 3.2 oz (75.4 kg)    Physical Exam Constitutional:      Appearance: He is not diaphoretic.  HENT:     Head: Normocephalic and atraumatic.     Nose: Nose normal. No congestion.     Mouth/Throat:     Mouth: Mucous membranes are moist.     Pharynx: Oropharynx is clear.  Eyes:     Conjunctiva/sclera: Conjunctivae normal.     Pupils: Pupils are equal, round, and reactive to light.  Neck:     Thyroid: No thyromegaly.     Vascular: No JVD.     Trachea: No tracheal deviation.  Cardiovascular:     Rate and Rhythm: Normal rate and regular rhythm.     Heart sounds: No murmur heard. Pulmonary:     Effort: No respiratory distress.     Breath sounds: No wheezing (mild, exp bilateral mid lobes).     Comments: Decreased bases, mild increase WOB Abdominal:     General: Bowel sounds are normal. There is distension.     Palpations: Abdomen is soft. There is no mass.     Tenderness: There is no abdominal tenderness. There is no right CVA tenderness, left CVA tenderness, guarding or rebound.     Hernia: No hernia is present.  Genitourinary:    Comments: Condom cath bag with dark yellow urine Musculoskeletal:        General: Swelling (BUE and bilateral thighs) present.  Lymphadenopathy:     Cervical: No cervical adenopathy.  Skin:    General: Skin is warm and dry.     Coloration: Skin is pale.  Neurological:     Mental  Status: He is alert.     Comments: Left sided weakness and left facial droop  Psychiatric:        Mood and Affect: Mood normal.    Labs reviewed: Recent Labs     12/18/20 1630 12/19/20 0610 12/19/20 1632 12/20/20 0153 01/01/21 1817 01/02/21 0240 01/03/21 0221  NA  --  139  --    < > 138 137 138  K  --  3.5  --    < > 3.8 4.3 3.7  CL  --  108  --    < > 109 106 109  CO2  --  23  --    < > _0 GLUCOSE  --  186*  --    < > 135* 122* 100*  BUN  --  16  --    < > _1 CREATININE  --  0.76  --    < > 0.84 0.88 0.93  CALCIUM  --  7.7*  --    < > 8.2* 8.4* 8.3*  MG 1.9 2.0 1.9   < > 2.1 2.0 2.0  PHOS 3.1 2.7 2.4*  --   --   --   --    < > = values in this interval not displayed.   Recent Labs    01/01/21 0042 01/02/21 0240 01/03/21 0221  AST 37 52* 32  ALT 68* 77* 63*  ALKPHOS 88 105 94  BILITOT 0.5 0.7 0.4  PROT 6.3* 6.4* 6.3*  ALBUMIN 1.9* 2.0* 1.9*   Recent Labs    08/16/20 0427 08/17/20 0340 12/14/20 1245 12/14/20 1504 12/16/20 0130 12/18/20 0221 12/31/20 0101 01/01/21 0042 01/02/21 0240  WBC 7.9   < > 12.0*   < > 10.7*   < > 13.0* 10.5 10.3  NEUTROABS 5.7  --  9.3*  --  7.7  --   --   --   --   HGB 9.0*   < > 11.7*   < > 9.9*   < > 8.2* 7.6* 7.9*  HCT 28.2*   < > 36.8*   < > 30.7*   < > 25.7* 24.4* 25.0*  MCV 98.6   < > 90.0   < > 88.0   < > 89.9 91.4 92.6  PLT 177   < > 119*   < > 106*   < > 268 252 254   < > = values in this interval not displayed.   Lab Results  Component Value Date   TSH 1.567 12/14/2020   Lab Results  Component Value Date   HGBA1C 7.3 (H) 12/17/2020   Lab Results  Component Value Date   CHOL 119 08/08/2020   HDL 42 08/08/2020   LDLCALC 56 08/08/2020   TRIG 105 08/08/2020   CHOLHDL 2.8 08/08/2020    Significant Diagnostic Results in last 30 days:  CT Head Wo Contrast  Result Date: 12/14/2020 CLINICAL DATA:  Mental status change, unknown cause EXAM: CT HEAD WITHOUT CONTRAST TECHNIQUE: Contiguous axial images were obtained from the base of the skull through the vertex without intravenous contrast. COMPARISON:  11/02/2020 FINDINGS: Brain: Old right MCA infarct with  encephalomalacia. Old left parietal infarct. Findings are stable since prior study. Small right subdural hematoma again noted overlying the right frontoparietal lobe, 3 mm compared to 5 mm previously. There is atrophy and chronic small vessel disease changes. Ventriculomegaly likely rib related to ex vacuo dilatation. No acute infarct or acute hemorrhage. Vascular: No  hyperdense vessel or unexpected calcification. Skull: Prior right temporoparietal craniotomy. No acute calvarial abnormality. Sinuses/Orbits: No acute findings Other: None IMPRESSION: Small right subdural hematoma, 3 mm compared with 5 mm previously. No new hemorrhage. Old right MCA and left parietal infarcts, stable. Ventriculomegaly related to ex vacuo dilatation. Atrophy, chronic microvascular disease. No acute intracranial abnormality. Electronically Signed   By: Rolm Baptise M.D.   On: 12/14/2020 13:50   DG Chest Port 1 View  Result Date: 12/14/2020 CLINICAL DATA:  Altered mental status EXAM: PORTABLE CHEST 1 VIEW COMPARISON:  Chest radiograph 08/07/2020 FINDINGS: Median sternotomy wires and mitral and aortic valve prostheses are stable. The cardiomediastinal silhouette is stable. There is calcified atherosclerotic plaque of the aortic arch. Lung volumes are low. There is no focal consolidation or pulmonary edema. There is no pleural effusion or pneumothorax. There is no acute osseous abnormality. IMPRESSION: Low lung volumes. Otherwise, no radiographic evidence of acute cardiopulmonary process. Electronically Signed   By: Valetta Mole M.D.   On: 12/14/2020 13:01   ECHOCARDIOGRAM COMPLETE  Result Date: 12/15/2020    ECHOCARDIOGRAM REPORT   Patient Name:   Aayush EMILY FORSE Date of Exam: 12/15/2020 Medical Rec #:  [id number]     Height:       67.0 in Accession #:    [id number]    Weight:       166.2 lb Date of Birth:  1940-12-10     BSA:          1.869 m Patient Age:    80 years      BP:           120/67 mmHg Patient Gender: M             HR:            98 bpm. Exam Location:  Inpatient Procedure: 2D Echo, Cardiac Doppler, Color Doppler and Intracardiac            Opacification Agent Indications:    Fever R50.9  History:        Patient has prior history of Echocardiogram examinations, most                 recent 08/07/2020. Stroke; Arrythmias:Atrial Fibrillation. Past                 history of rheumatic fever, acute rheumatic pericarditis.                 Thoracic aortic aneurysm.                 Aortic Valve: 27 mm Carpentier-Edwards prosthetic valve is                 present in the aortic position. Procedure Date: 10/18/2004.  Sonographer:    Darlina Sicilian RDCS Sonographer#2:  Darlina Sicilian RDCS Referring Phys: [id number] [place]  1. The mitral valve has been replaced with an unknown bioprosthesis. No evidence of mitral valve regurgitation. The mean mitral valve gradient is 10.0 mmHg with average heart rate of 101 bpm. PHT 70 ms. Suboptimal LVOT spectral Doppler- VTI deferred. Date of Procedure Date: 10/18/2004.  2. The aortic valve has been repaired/replaced. Aortic valve regurgitation is not visualized. 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 Procedure Date: 10/18/2004. Aortic valve mean gradient measures 16.0 mmHg.  Aortic valve acceleration time measures 74 msec.  3. Left ventricular ejection fraction, by estimation, is 65 to 70%. The left ventricle  has normal function. The left ventricle has no regional wall motion abnormalities. There is mild concentric left ventricular hypertrophy. Left ventricular diastolic parameters are indeterminate.  4. Aortic dilatation noted. 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  5. Right ventricular systolic function is normal. The right ventricular size is normal. Comparison(s): A prior study was performed on 08/07/2020. Prior images reviewed side by side.  Stable aortic root and ascending aorta size. Increased mitral valve gradients, but at higher hear rates from prior. FINDINGS  Left Ventricle: Left ventricular ejection fraction, by estimation, is 65 to 70%. The left ventricle has normal function. The left ventricle has no regional wall motion abnormalities. Definity contrast agent was given IV to delineate the left ventricular  endocardial borders. The left ventricular internal cavity size was normal in size. There is mild concentric left ventricular hypertrophy. Left ventricular diastolic parameters are indeterminate. Right Ventricle: The right ventricular size is normal. No increase in right ventricular wall thickness. Right ventricular systolic function is normal. Left Atrium: Left atrial size was normal in size. Right Atrium: Right atrial size was normal in size. Pericardium: There is no evidence of pericardial effusion. Mitral Valve: The mitral valve has been repaired/replaced. No evidence of mitral valve regurgitation. There is a 29 mm Carpentier-Edwards prosthetic present in the mitral position. MV peak gradient, 17.1 mmHg. The mean mitral valve gradient is 10.0 mmHg with average heart rate of 101 bpm. Tricuspid Valve: The tricuspid valve is grossly normal. Tricuspid valve regurgitation is mild . No evidence of tricuspid stenosis. Aortic Valve: The aortic valve has been repaired/replaced. Aortic valve regurgitation is not visualized. Aortic valve mean gradient measures 16.0 mmHg. Aortic valve peak gradient measures 24.4 mmHg. Aortic valve area, by VTI measures 1.56 cm. There is a  27 mm Carpentier-Edwards prosthetic valve present in the aortic position. Procedure Date: 10/18/2004. Pulmonic Valve: The pulmonic valve was normal in structure. Pulmonic valve regurgitation is mild. No evidence of pulmonic stenosis. Aorta: The ascending aorta was not well visualized and aortic dilatation noted. There is moderate dilatation of the aortic root, measuring 48 mm.  There is moderate dilatation of the ascending aorta, measuring 49 mm. IAS/Shunts: The atrial septum is grossly normal.  LEFT VENTRICLE PLAX 2D LVIDd:         4.80 cm LVIDs:         3.50 cm LV PW:         1.30 cm LV IVS:        1.30 cm LVOT diam:     3.25 cm LV SV:         62 LV SV Index:   33 LVOT Area:     8.30 cm  LV Volumes (MOD) LV vol d, MOD A2C: 81.2 ml LV vol d, MOD A4C: 94.4 ml LV vol s, MOD A2C: 18.0 ml LV vol s, MOD A4C: 30.1 ml LV SV MOD A2C:     63.2 ml LV SV MOD A4C:     94.4 ml LV SV MOD BP:      63.9 ml RIGHT VENTRICLE RV S prime:     10.40 cm/s LEFT ATRIUM             Index        RIGHT ATRIUM          Index LA diam:        4.50 cm 2.41 cm/m   RA Area:  8.74 cm LA Vol (A2C):   58.1 ml 31.08 ml/m  RA Volume:   12.60 ml 6.74 ml/m LA Vol (A4C):   56.9 ml 30.44 ml/m LA Biplane Vol: 58.9 ml 31.51 ml/m  AORTIC VALVE AV Area (Vmax):    5.29 cm AV Area (Vmean):   1.55 cm AV Area (VTI):     1.56 cm AV Vmax:           247.00 cm/s AV Vmean:          190.500 cm/s AV VTI:            0.401 m AV Peak Grad:      24.4 mmHg AV Mean Grad:      16.0 mmHg LVOT Vmax:         157.40 cm/s LVOT Vmean:        35.700 cm/s LVOT VTI:          0.075 m LVOT/AV VTI ratio: 0.19  AORTA Ao Asc diam: 4.90 cm MITRAL VALVE                TRICUSPID VALVE MV Area (PHT): 3.16 cm     TR Peak grad:   26.4 mmHg MV Area VTI:   1.39 cm     TR Vmax:        257.00 cm/s MV Peak grad:  17.1 mmHg MV Mean grad:  10.0 mmHg    SHUNTS MV Vmax:       2.07 m/s     Systemic VTI:  0.08 m MV Vmean:      151.0 cm/s   Systemic Diam: 3.25 cm MV Decel Time: 240 msec MV E velocity: 195.00 cm/s Rudean Haskell MD Electronically signed by Rudean Haskell MD Signature Date/Time: 12/15/2020/12:49:20 PM    Final     Assessment/Plan  1. SOB (shortness of breath) Due to edema vs abd distention, sats 91-94% Has some abd distention with hx of constipation and mild ileus. Will give dulcolax supp Check xray. Has edema but would be prone to  dehydration. May need a small dose of lasix.   2. Prosthetic valve endocarditis, subsequent encounter Continue Rocephin and Ampicillin per ID Has labs ordered weekly   3. Traumatic subdural hemorrhage S/p craniotomy  4. 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 Oropharyngeal dysphagia Continue D3 diet with asp prec HOB elevated, Assistance with all meals  6. Slow transit constipation Continue miralax qd prn Continue colace 100 mg bid See #1  7. Atrial fibrillation Regular on exam Continue on tikosyn and Eliquis  8. Acute metabolic encephalopathy Improving, still has periods of lethargy and slow spech   9. Seizures (HCC) No new, continue vimpat  10. Anemia of chronic disease Continue to monitor.  If <7 may need transfusion  11. Transaminitis Improving.   12. Hypoalbuminemia 1.9, poor indication overall status.  Contributes to edematous state.  Continue glucerna  13. Controlled type 2 diabetes mellitus with complication, without long-term current use of insulin (HCC) Lab Results  Component Value Date   HGBA1C 7.3 (H) 12/17/2020  Off insulin At risk for hypoglycemia unawareness Check CBG qam   Monitor weights weekly but obtain new weight in the am I and O qshift, Vital qshift.   Family/ staff Communication: discussed with his wife Herbert Pun  Labs/tests ordered:  CXR KUB

## 2021-01-08 ENCOUNTER — Encounter: Payer: Self-pay | Admitting: Adult Health

## 2021-01-08 ENCOUNTER — Non-Acute Institutional Stay (SKILLED_NURSING_FACILITY): Payer: Medicare Other | Admitting: Adult Health

## 2021-01-08 DIAGNOSIS — R0602 Shortness of breath: Secondary | ICD-10-CM

## 2021-01-08 DIAGNOSIS — I130 Hypertensive heart and chronic kidney disease with heart failure and stage 1 through stage 4 chronic kidney disease, or unspecified chronic kidney disease: Secondary | ICD-10-CM | POA: Diagnosis not present

## 2021-01-08 DIAGNOSIS — I10 Essential (primary) hypertension: Secondary | ICD-10-CM | POA: Diagnosis not present

## 2021-01-08 DIAGNOSIS — I5032 Chronic diastolic (congestive) heart failure: Secondary | ICD-10-CM | POA: Diagnosis not present

## 2021-01-08 DIAGNOSIS — R41841 Cognitive communication deficit: Secondary | ICD-10-CM | POA: Diagnosis not present

## 2021-01-08 DIAGNOSIS — Z79899 Other long term (current) drug therapy: Secondary | ICD-10-CM | POA: Diagnosis not present

## 2021-01-08 DIAGNOSIS — I6601 Occlusion and stenosis of right middle cerebral artery: Secondary | ICD-10-CM | POA: Diagnosis not present

## 2021-01-08 DIAGNOSIS — R140 Abdominal distension (gaseous): Secondary | ICD-10-CM | POA: Diagnosis not present

## 2021-01-08 DIAGNOSIS — S065X3S Traumatic subdural hemorrhage with loss of consciousness of 1 hour to 5 hours 59 minutes, sequela: Secondary | ICD-10-CM | POA: Diagnosis not present

## 2021-01-08 DIAGNOSIS — I69391 Dysphagia following cerebral infarction: Secondary | ICD-10-CM | POA: Diagnosis not present

## 2021-01-08 LAB — BASIC METABOLIC PANEL
BUN: 15 (ref 4–21)
CO2: 24 — AB (ref 13–22)
Chloride: 107 (ref 99–108)
Creatinine: 0.6 (ref 0.6–1.3)
Glucose: 98
Potassium: 3.6 (ref 3.4–5.3)
Sodium: 141 (ref 137–147)

## 2021-01-08 LAB — COMPREHENSIVE METABOLIC PANEL
Albumin: 2.8 — AB (ref 3.5–5.0)
Calcium: 8.4 — AB (ref 8.7–10.7)

## 2021-01-08 LAB — CBC AND DIFFERENTIAL
HCT: 24 — AB (ref 41–53)
Hemoglobin: 8 — AB (ref 13.5–17.5)
WBC: 10.9

## 2021-01-08 LAB — CBC: RBC: 2.73 — AB (ref 3.87–5.11)

## 2021-01-08 LAB — HEPATIC FUNCTION PANEL
ALT: 42 — AB (ref 10–40)
AST: 26 (ref 14–40)
Alkaline Phosphatase: 120 (ref 25–125)
Bilirubin, Direct: 0.2 (ref 0.01–0.4)
Bilirubin, Total: 0.4

## 2021-01-08 NOTE — Progress Notes (Addendum)
Location:  [place] Room Number: 158 Place of Service:  SNF [phone]) Provider:  Royal Hawthorn, NP  [name], MD  Patient Care Team: [name], MD as PCP - General (Internal Medicine) Larey Dresser, MD as PCP - Cardiology (Cardiology) [name], MD as Consulting Physician (Ophthalmology) [name], MD (Internal Medicine)  Extended Emergency Contact Information Primary Emergency Contact: [name] Address: [place], [place] [address] [name] of [place] Phone: [phone] Relation: Spouse Secondary Emergency Contact: [name], [place] [address] [name] of [place] Phone: [phone] Relation: Son  Code Status:  DNR Goals of care: Advanced Directive information Advanced Directives 01/07/2021  Does Patient Have a Medical Advance Directive? Yes  Type of Paramedic of [place];Living will;Out of facility DNR (pink MOST or yellow form)  Does patient want to make changes to medical advance directive? No - Patient declined  Copy of [place] in Chart? Yes - validated most recent copy scanned in chart (See row information)  Would patient like information on creating a medical advance directive? -  Pre-existing out of facility DNR order (yellow form or pink MOST form) -     Chief Complaint  Patient presents with   Acute Visit    Acute sob/edema     HPI:  Pt is a 80 y.o. male seen today for an acute visit for f/u regarding sob and edema. He is s/p admission from 12/14/20-01/04/21 for acute metabolic encephalopathy due to hypernatremia and enterococcal bacteremia with prosthetic valve endocarditis. See note 11/3 for more details.   Over night he remained stable sats 96%, lowest number was 91%.  He continues with some mild increased wob noted by staff. Resident is able to communicate and denies any feelings of discomfort.    Alb 1.9 on 10/31  01/08/21 CXR moderate patchy bilateral densities right greater than left, compatible with pneumonia. Mild cardiomegaly.  01/08/21 KUB no acute intra abdominal process, mild stool in the colon, mild ostepenia, mild spondylosis.   CXR reviewed from the hospital 10/27 indicating pulmonary edema  Weight 01/08/2021 188 lbs, no change from the day prior. Up 22 lbs since Sept.   Continues with significant edema to abd and arms.   No cough. Temp 98,4, did have low grade temps for two days 99.2-99.4   Continues on ampicillin and rocephin for endocarditis.  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    Other and unspecified hyperlipidemia    Pre-diabetes    Previous back surgery 1978, jan 2007   Psychosexual dysfunction with inhibited sexual excitement    SOB (shortness of breath)    "with heavy exercise"   Stroke ([place]) 08/2013   "I WAS IN AFIB AND THREW A CLOT, THE EFFECTS WERE  TRANSITORY AND DIDNT LAST BUT FOR 30 MINUTES"    Thoracic aortic aneurysm    Past Surgical History:  Procedure Laterality Date   AORTIC AND MITRAL VALVE REPLACEMENT     09/2004   CARDIOVERSION     3 times from 2004-2006   CARDIOVERSION N/A 09/26/2013   Procedure: CARDIOVERSION;  Surgeon: Larey Dresser, MD;  Location: [place];  Service: Cardiovascular;  Laterality: N/A;   CARDIOVERSION N/A 06/19/2014   Procedure: CARDIOVERSION;  Surgeon: Jerline Pain, MD;  Location: [place];  Service: Cardiovascular;   Laterality: N/A;   CARDIOVERSION N/A 04/24/2017   Procedure: CARDIOVERSION;  Surgeon: Larey Dresser, MD;  Location: [place];  Service: Cardiovascular;  Laterality: N/A;   CARDIOVERSION N/A 06/08/2017   Procedure: CARDIOVERSION;  Surgeon: Pixie Casino, MD;  Location: [facility] ENDOSCOPY;  Service: Cardiovascular;  Laterality: N/A;   CARDIOVERSION N/A 08/24/2017   Procedure: CARDIOVERSION;  Surgeon: Skeet Latch, MD;  Location: [facility] ENDOSCOPY;  Service: Cardiovascular;  Laterality: N/A;   CARDIOVERSION N/A 02/14/2018   Procedure: CARDIOVERSION;  Surgeon: Larey Dresser, MD;  Location: [facility] ENDOSCOPY;  Service: Cardiovascular;  Laterality: N/A;   COLONOSCOPY     CRANIOTOMY N/A 07/28/2020   Procedure: CRANIOTOMY FOR EVACUATION OF SUBDURAL HEMATOMA;  Surgeon: Eustace Grell, MD;  Location: [place];  Service: Neurosurgery;  Laterality: N/A;   IR ANGIO VERTEBRAL SEL SUBCLAVIAN INNOMINATE UNI R MOD SED  08/07/2020   IR CT HEAD LTD  08/07/2020   IR PERCUTANEOUS ART THROMBECTOMY/INFUSION INTRACRANIAL INC DIAG ANGIO  08/07/2020   laminectomies     10/1975 and in 03/2005   RADIOLOGY WITH ANESTHESIA N/A 08/07/2020   Procedure: IR WITH ANESTHESIA;  Surgeon: Luanne Bras, MD;  Location: [place];  Service: Radiology;  Laterality: N/A;   TEE WITHOUT CARDIOVERSION N/A 12/18/2020   Procedure: TRANSESOPHAGEAL ECHOCARDIOGRAM (TEE);  Surgeon: Larey Dresser, MD;  Location: [facility] ENDOSCOPY;  Service: Cardiovascular;  Laterality: N/A;   [place] Right 04/03/2018   Procedure: TOTAL HIP ARTHROPLASTY ANTERIOR APPROACH;  Surgeon: Paralee Cancel, MD;  Location: [facility];  Service: Orthopedics;  Laterality: Right;  70 mins    Allergies  Allergen Reactions   Naproxen Hives   No [facility] Food Allergies Other (See Comments)    Scallops - distress, nausea and vomitting    Allergies as of 01/08/2021       Reactions   Naproxen Hives   No [facility] Food Allergies Other (See  Comments)   Scallops - distress, nausea and vomitting        Medication List        Accurate as of January 08, 2021 10:22 AM. If you have any questions, ask your nurse or doctor.          amantadine 50 MG/5ML solution Commonly known as: SYMMETREL 8 AM and 2 PM   amLODipine 10 MG tablet Commonly known as: NORVASC Take 1 tablet (10 mg total) by mouth daily.   ampicillin  IVPB Inject 12 g into the vein daily. As a continuous infusion. Indication:  Enterococcus endocarditis  First Dose: Yes Last Day of Therapy:  01/28/21 Labs - Once weekly:  CBC/D and BMP, Labs - Every other week:  ESR and CRP Method of administration: Ambulatory Pump (Continuous Infusion) Method of administration may be changed at the discretion of home infusion pharmacist based upon assessment of the patient and/or caregiver's ability to self-administer the medication ordered.   apixaban 5  MG Tabs tablet Commonly known as: ELIQUIS Take 1 tablet (5 mg total) by mouth 2 (two) times daily.   atorvastatin 80 MG tablet Commonly known as: LIPITOR Take 1 tablet (80 mg total) by mouth daily.   bisacodyl 10 MG suppository Commonly known as: DULCOLAX Place 1 suppository (10 mg total) rectally daily as needed for moderate constipation.   cefTRIAXone  IVPB Commonly known as: ROCEPHIN Inject 2 g into the vein every 12 (twelve) hours. Indication:  Enterococcal endocarditis  First Dose: Yes Last Day of Therapy:  01/28/21 Labs - Once weekly:  CBC/D and BMP, Labs - Every other week:  ESR and CRP Method of administration: IV Push Method of administration may be changed at the discretion of home infusion pharmacist based upon assessment of the patient and/or caregiver's ability to self-administer the medication ordered.   clobetasol cream 0.05 % Commonly known as: TEMOVATE Apply 1 application topically daily as needed (for skin irritation).   docusate sodium 100 MG capsule Commonly known as: Colace Take 1  capsule (100 mg total) by mouth 2 (two) times daily.   dofetilide 125 MCG capsule Commonly known as: TIKOSYN Take 3 capsules (375 mcg total) by mouth 2 (two) times daily.   famotidine 20 MG tablet Commonly known as: PEPCID Take 20 mg by mouth at bedtime as needed.   feeding supplement (GLUCERNA SHAKE) Liqd Take 237 mLs by mouth 2 (two) times daily between meals. For weight loss   finasteride 5 MG tablet Commonly known as: PROSCAR Take 1 tablet (5 mg total) by mouth daily.   hydrocortisone cream 1 % Apply topically 2 (two) times daily as needed for itching (rash).   lacosamide 50 MG Tabs tablet Commonly known as: VIMPAT Take 1 tablet (50 mg total) by mouth 2 (two) times daily.   loratadine 10 MG tablet Commonly known as: CLARITIN Take 10 mg by mouth at bedtime.   magnesium gluconate 500 MG tablet Commonly known as: MAGONATE Take 1 tablet (500 mg total) by mouth daily.   multivitamin with minerals Tabs tablet Take 1 tablet by mouth daily.   polyethylene glycol 17 g packet Commonly known as: MIRALAX / GLYCOLAX Take 17 g by mouth daily as needed.   polyvinyl alcohol 1.4 % ophthalmic solution Commonly known as: LIQUIFILM TEARS Place 1 drop into both eyes 2 (two) times daily.   tamsulosin 0.4 MG Caps capsule Commonly known as: FLOMAX Take 0.4 mg by mouth daily.        Review of Systems  Constitutional:  Negative for activity change, appetite change, chills, diaphoresis, fatigue, fever and unexpected weight change (gain).  Respiratory:  Positive for shortness of breath. Negative for cough, wheezing and stridor.   Cardiovascular:  Negative for chest pain, palpitations and leg swelling.  Gastrointestinal:  Positive for abdominal distention. Negative for abdominal pain, constipation and diarrhea.  Genitourinary:  Negative for difficulty urinating and dysuria.  Musculoskeletal:  Positive for gait problem. Negative for arthralgias, back pain, joint swelling and myalgias.   Neurological:  Positive for facial asymmetry and weakness. Negative for dizziness, seizures, syncope, speech difficulty and headaches.  Hematological:  Negative for adenopathy. Does not bruise/bleed easily.  Psychiatric/Behavioral:  Positive for confusion. Negative for agitation and behavioral problems.    Immunization History  Administered Date(s) Administered   Fluad Quad(high Dose 65+) 12/06/2018, 12/09/2020   Influenza Whole 12/25/2008, 01/05/2010, 12/30/2011   Influenza, High Dose Seasonal PF 12/14/2016   Influenza,inj,Quad PF,6+ Mos 12/19/2013   Influenza,inj,quad, With Preservative 11/22/2017   Influenza-Unspecified  12/14/2015, 12/24/2019   Moderna SARS-COV2 Booster Vaccination 01/02/2020, 06/26/2020   Moderna Sars-Covid-2 Vaccination 04/08/2019, 05/05/2019   Pneumococcal Conjugate-13 12/19/2013   Pneumococcal Polysaccharide-23 09/24/2007, 05/22/2017   Td 12/25/2008   Tdap 03/22/2018   Zoster Recombinat (Shingrix) 07/16/2019, 10/28/2019   Zoster, Live 09/24/2007   Pertinent  Health Maintenance Due  Topic Date Due   FOOT EXAM  Never done   URINE MICROALBUMIN  Never done   OPHTHALMOLOGY EXAM  04/07/2021 (Originally 10/04/1950)   HEMOGLOBIN A1C  06/17/2021   INFLUENZA VACCINE  Completed   Fall Risk 01/03/2021 01/03/2021 01/04/2021 01/04/2021 01/05/2021  Falls in the past year? - - - - -  Was there an injury with Fall? - - - - -  Fall Risk Category Calculator - - - - -  Fall Risk Category - - - - -  Patient Fall Risk Level _0   Patient at Risk for Falls Due to - - - - -  Patient at Risk for Falls Due to - - - - -  Fall risk Follow up - - - - -   Functional Status Survey:    Vitals:   01/08/21 1015  BP: (!) 151/68  Pulse: 83  Resp: (!) 22  Temp: 98.4 F (36.9 C)  SpO2: 96%  Weight: 188 lb 6.4 oz (85.5 kg)  Height: _1  (1.702 m)   Body mass index is 29.51 kg/m. Physical Exam Vitals  reviewed.  Constitutional:      General: He is not in acute distress.    Appearance: He is not diaphoretic.  HENT:     Head: Normocephalic and atraumatic.     Mouth/Throat:     Mouth: Mucous membranes are moist.     Pharynx: Oropharynx is clear.  Neck:     Thyroid: No thyromegaly.     Vascular: No JVD.     Trachea: No tracheal deviation.  Cardiovascular:     Rate and Rhythm: Normal rate and regular rhythm.     Heart sounds: No murmur heard. Pulmonary:     Effort: Pulmonary effort is normal. No respiratory distress.     Breath sounds: Wheezing present.     Comments: Exp wheeze, slight increase in work of breathing.  Abdominal:     General: Bowel sounds are normal. There is distension.     Palpations: Abdomen is soft.     Tenderness: There is no abdominal tenderness.  Musculoskeletal:        General: Swelling (arms, abd thighs ptting) present.     Cervical back: Normal range of motion and neck supple.  Lymphadenopathy:     Cervical: No cervical adenopathy.  Skin:    General: Skin is warm and dry.  Neurological:     Mental Status: He is alert.     Comments: Left facial droop and left sided weakness. Alert and oriented to self     Labs reviewed: Recent Labs    12/18/20 1630 12/19/20 0610 12/19/20 1632 12/20/20 0153 01/01/21 1817 01/02/21 0240 01/03/21 0221  NA  --  139  --    < > 138 137 138  K  --  3.5  --    < > 3.8 4.3 3.7  CL  --  108  --    < > 109 106 109  CO2  --  23  --    < > _2 GLUCOSE  --  186*  --    < >  135* 122* 100*  BUN  --  16  --    < > _0 CREATININE  --  0.76  --    < > 0.84 0.88 0.93  CALCIUM  --  7.7*  --    < > 8.2* 8.4* 8.3*  MG 1.9 2.0 1.9   < > 2.1 2.0 2.0  PHOS 3.1 2.7 2.4*  --   --   --   --    < > = values in this interval not displayed.   Recent Labs    01/01/21 0042 01/02/21 0240 01/03/21 0221  AST 37 52* 32  ALT 68* 77* 63*  ALKPHOS 88 105 94  BILITOT 0.5 0.7 0.4  PROT 6.3* 6.4* 6.3*  ALBUMIN 1.9* 2.0* 1.9*    Recent Labs    08/16/20 0427 08/17/20 0340 12/14/20 1245 12/14/20 1504 12/16/20 0130 12/18/20 0221 12/31/20 0101 01/01/21 0042 01/02/21 0240  WBC 7.9   < > 12.0*   < > 10.7*   < > 13.0* 10.5 10.3  NEUTROABS 5.7  --  9.3*  --  7.7  --   --   --   --   HGB 9.0*   < > 11.7*   < > 9.9*   < > 8.2* 7.6* 7.9*  HCT 28.2*   < > 36.8*   < > 30.7*   < > 25.7* 24.4* 25.0*  MCV 98.6   < > 90.0   < > 88.0   < > 89.9 91.4 92.6  PLT 177   < > 119*   < > 106*   < > 268 252 254   < > = values in this interval not displayed.   Lab Results  Component Value Date   TSH 1.567 12/14/2020   Lab Results  Component Value Date   HGBA1C 7.3 (H) 12/17/2020   Lab Results  Component Value Date   CHOL 119 08/08/2020   HDL 42 08/08/2020   LDLCALC 56 08/08/2020   TRIG 105 08/08/2020   CHOLHDL 2.8 08/08/2020    Significant Diagnostic Results in last 30 days:  DG Wrist 2 Views Right  Result Date: 12/22/2020 CLINICAL DATA:  Tenderness with movement. EXAM: RIGHT WRIST - 2 VIEW COMPARISON:  None. FINDINGS: Radiocarpal joint appears within normal limits. There are degenerative changes the articulation between the scaphoid and the multangular bones and at the first and second carpometacarpal articulations. No sign of fracture. Regional arterial calcification incidentally noted. IMPRESSION: Radial side degenerative changes as described above which could certainly be painful. Electronically Signed   By: Nelson Chimes M.D.   On: 12/22/2020 13:51   CT Head Wo Contrast  Result Date: 12/14/2020 CLINICAL DATA:  Mental status change, unknown cause EXAM: CT HEAD WITHOUT CONTRAST TECHNIQUE: Contiguous axial images were obtained from the base of the skull through the vertex without intravenous contrast. 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 Old left parietal infarct. Findings are stable since prior study. Small right subdural hematoma again noted overlying the right  frontoparietal lobe, 3 mm compared to 5 mm previously. There is atrophy and chronic small vessel disease changes. Ventriculomegaly likely rib related to ex vacuo dilatation. No acute infarct or acute hemorrhage. Vascular: No hyperdense vessel or unexpected calcification. Skull: Prior right temporoparietal craniotomy. No acute calvarial abnormality. Sinuses/Orbits: No acute findings Other: None IMPRESSION: Small right subdural hematoma, 3 mm compared with 5 mm previously. No new hemorrhage. Old right MCA and left parietal  infarcts, stable. Ventriculomegaly related to ex vacuo dilatation. Atrophy, chronic microvascular disease. No acute intracranial abnormality. Electronically Signed   By: Rolm Baptise M.D.   On: 12/14/2020 13:50   MR BRAIN WO CONTRAST  Result Date: 12/18/2020 CLINICAL DATA:  Mental status change, unknown cause EXAM: MRI HEAD WITHOUT CONTRAST TECHNIQUE: Multiplanar, multiecho pulse sequences of the brain and surrounding structures were obtained without intravenous contrast. COMPARISON:  08/29/2020 FINDINGS: Motion artifact is present. Brain: Small focus of reduced diffusion is present within the left cerebellar vermis. Diffusion hyperintensity in the region of prior right MCA infarct is probably artifactual. There is a 1.2 cm subacute hematoma of the anterior right superior frontal gyrus (series 6, image 18). Chronic right MCA territory infarction with chronic blood products. Ex vacuo dilatation of the adjacent right lateral ventricle. Chronic infarct on the left near junction of parietal, temporal, and occipital lobes also with chronic blood products. Additional confluent T2 hyperintensity in the supratentorial white matter is nonspecific but may reflect chronic microvascular ischemic changes. Disproportionate prominence of the lateral and third ventricles is unchanged. Thin extra-axial collection underlies craniotomy. Vascular: Major vessel flow voids at the skull base are preserved. Skull and  upper cervical spine: Normal marrow signal is preserved. Prior right craniotomy. Sinuses/Orbits: Minor mucosal thickening. No acute orbital abnormality. Other: Sella is unremarkable.  Mastoid air cells are clear. IMPRESSION: Small acute infarct left cerebellar vermis. Small subacute hematoma anterior right frontal lobe (isodense on CT). Stable chronic findings of infarcts, volume loss, and chronic microvascular ischemic changes. Electronically Signed   By: Macy Mis M.D.   On: 12/18/2020 12:49   DG Chest Port 1 View  Result Date: 12/31/2020 CLINICAL DATA:  Shortness of breath EXAM: PORTABLE CHEST 1 VIEW COMPARISON:  Chest x-ray dated December 30, 2020 FINDINGS: Cardiac and mediastinal contours are unchanged post median sternotomy. Slightly increased bilateral central opacities. No large pleural effusion or evidence of pneumothorax. Enteric tube partially visualized coursing below the diaphragm. IMPRESSION: Slightly increased bilateral interstitial opacities, likely due to worsening pulmonary edema. Electronically Signed   By: Yetta Glassman M.D.   On: 12/31/2020 08:11   DG Chest Port 1 View  Result Date: 12/30/2020 CLINICAL DATA:  Stroke, AFib, sepsis EXAM: PORTABLE CHEST 1 VIEW COMPARISON:  Radiograph 12/29/2020 FINDINGS: Feeding tube passes below diaphragm, tip excluded by collimation. Prior median sternotomy and valve replacement. Unchanged cardiomegaly. Diffuse interstitial opacities, similar to prior exam. No large pleural effusion or visible pneumothorax. Bones are unchanged. IMPRESSION: Unchanged cardiomegaly. Diffuse interstitial opacities consistent with pulmonary edema. Electronically Signed   By: Maurine Simmering M.D.   On: 12/30/2020 08:45   DG Chest Port 1 View  Result Date: 12/29/2020 CLINICAL DATA:  Shortness of breath EXAM: PORTABLE CHEST 1 VIEW COMPARISON:  12/28/2020 FINDINGS: Cardiomegaly status post median sternotomy with aortic and mitral valvular prosthesis. Mild, diffuse  bilateral interstitial pulmonary opacity, similar compared to prior examination. No new airspace opacity. Partially imaged enteric feeding tube. IMPRESSION: Cardiomegaly. Mild, diffuse bilateral interstitial pulmonary opacity, similar compared to prior examination, consistent with edema or infection. No new airspace opacity. Electronically Signed   By: Delanna Ahmadi M.D.   On: 12/29/2020 11:40   DG Chest Port 1 View  Result Date: 12/28/2020 CLINICAL DATA:  Shortness of breath, CHF EXAM: PORTABLE CHEST 1 VIEW COMPARISON:  Portable exam 0000 hours compared to 12/14/2020 FINDINGS: Feeding tube traverses esophagus into stomach. Enlargement of cardiac silhouette post median sternotomy, MVR, and AVR. Atherosclerotic calcification aorta. Minimal bibasilar atelectasis. Lungs otherwise clear.  No infiltrate, pleural effusion, or pneumothorax. No acute osseous findings. IMPRESSION: Minimal bibasilar atelectasis. Enlargement of cardiac silhouette post MVR and AVR. Electronically Signed   By: Lavonia Dana M.D.   On: 12/28/2020 08:36   DG Chest Port 1 View  Result Date: 12/14/2020 CLINICAL DATA:  Altered mental status EXAM: PORTABLE CHEST 1 VIEW COMPARISON:  Chest radiograph 08/07/2020 FINDINGS: Median sternotomy wires and mitral and aortic valve prostheses are stable. The cardiomediastinal silhouette is stable. There is calcified atherosclerotic plaque of the aortic arch. Lung volumes are low. There is no focal consolidation or pulmonary edema. There is no pleural effusion or pneumothorax. There is no acute osseous abnormality. IMPRESSION: Low lung volumes. Otherwise, no radiographic evidence of acute cardiopulmonary process. Electronically Signed   By: Valetta Mole M.D.   On: 12/14/2020 13:01   DG Abd Portable 1V  Result Date: 12/31/2020 CLINICAL DATA:  Ileus. EXAM: PORTABLE ABDOMEN - 1 VIEW COMPARISON:  December 30, 2020. FINDINGS: Distal tip of feeding tube is seen in expected position of distal stomach. No  significant bowel dilatation is seen at this time. Phleboliths are noted in the pelvis. IMPRESSION: No significant abnormal bowel dilatation is seen at this time. Electronically Signed   By: Marijo Conception M.D.   On: 12/31/2020 08:13   DG Abd Portable 1V  Result Date: 12/30/2020 CLINICAL DATA:  Nausea and vomiting. EXAM: PORTABLE ABDOMEN - 1 VIEW COMPARISON:  Same-day abdominal radiograph at 0550 hours FINDINGS: Enteric tube tip overlies expected location of the gastric antrum. Multiple gas-filled dilated loops of colon, similar to earlier radiograph. Mild-to-moderate stool burden within the ascending colon. No definite dilated small bowel loops. No free intraperitoneal air given the limits of the supine image. Partially visualized right hip arthroplasty. IMPRESSION: Stable position of the enteric tube, overlying the expected location of the gastric antrum. Unchanged exam compared to earlier same day radiograph with persistent diffuse gaseous dilatation of the colon. Electronically Signed   By: Ileana Roup M.D.   On: 12/30/2020 15:34   DG Abd Portable 1V  Result Date: 12/30/2020 CLINICAL DATA:  Nausea EXAM: PORTABLE ABDOMEN - 1 VIEW COMPARISON:  04/20/2020 FINDINGS: Feeding tube tip overlies the distal stomach. There is diffuse gaseous distension of the colon and moderate stool burden in the ascending colon and rectum. No clear small bowel dilation. Prior right total hip arthroplasty. Lumbar spine degenerative changes. No acute osseous abnormality. IMPRESSION: Diffuse gaseous distension of the colon with moderate stool burden in the ascending colon and rectum. Feeding tube tip overlies the distal stomach. Electronically Signed   By: Maurine Simmering M.D.   On: 12/30/2020 08:42   DG Abd Portable 1V  Result Date: 12/18/2020 CLINICAL DATA:  Feeding tube placement EXAM: PORTABLE ABDOMEN - 1 VIEW COMPARISON:  09/01/2020 FINDINGS: Feeding tube terminates at the distal stomach. Cardiomegaly with median  sternotomy and left base airspace disease. Non-obstructive bowel gas pattern. No gross free intraperitoneal air. IMPRESSION: Feeding tube terminating at the distal stomach. Electronically Signed   By: Abigail Miyamoto M.D.   On: 12/18/2020 16:44   DG Swallowing Func-Speech Pathology  Result Date: 01/01/2021 Table formatting from the original result was not included. 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) 08/2013  "I WAS IN AFIB AND THREW A CLOT, THE EFFECTS WERE TRANSITORY AND DIDNT LAST BUT FOR 30 MINUTES"   Thoracic aortic aneurysm  Past Surgical History: Past Surgical History: Procedure Laterality Date  AORTIC AND MITRAL VALVE REPLACEMENT    09/2004  CARDIOVERSION    3 times from 2004-2006  CARDIOVERSION N/A 09/26/2013  Procedure: CARDIOVERSION;  Surgeon: Larey Dresser, MD;  Location: [place];  Service: Cardiovascular;  Laterality: N/A;  CARDIOVERSION N/A 06/19/2014  Procedure: CARDIOVERSION;  Surgeon: Jerline Pain, MD;  Location: [place];  Service: Cardiovascular;  Laterality: N/A;  CARDIOVERSION N/A 04/24/2017  Procedure: CARDIOVERSION;  Surgeon: Larey Dresser, MD;  Location: [place];  Service: Cardiovascular;  Laterality: N/A;  CARDIOVERSION N/A 06/08/2017  Procedure: CARDIOVERSION;  Surgeon: Pixie Casino, MD;  Location: [facility] ENDOSCOPY;  Service: Cardiovascular;  Laterality: N/A;  CARDIOVERSION N/A 08/24/2017  Procedure: CARDIOVERSION;  Surgeon: Skeet Latch, MD;  Location: [facility] ENDOSCOPY;  Service: Cardiovascular;  Laterality: N/A;  CARDIOVERSION N/A 02/14/2018  Procedure: CARDIOVERSION;  Surgeon: Larey Dresser, MD;  Location: [facility] ENDOSCOPY;  Service: Cardiovascular;  Laterality: N/A;  COLONOSCOPY    CRANIOTOMY N/A 07/28/2020  Procedure: CRANIOTOMY FOR EVACUATION OF SUBDURAL HEMATOMA;  Surgeon: Eustace Pasquini, MD;  Location: [place];  Service: Neurosurgery;  Laterality: N/A;  IR ANGIO VERTEBRAL SEL SUBCLAVIAN INNOMINATE UNI R MOD SED  08/07/2020  IR CT HEAD LTD  08/07/2020  IR PERCUTANEOUS ART THROMBECTOMY/INFUSION INTRACRANIAL INC DIAG ANGIO  08/07/2020  laminectomies    10/1975 and in 03/2005  RADIOLOGY WITH ANESTHESIA N/A 08/07/2020  Procedure: IR WITH ANESTHESIA;  Surgeon: Luanne Bras, MD;  Location: [place];  Service: Radiology;  Laterality: N/A;  TEE WITHOUT CARDIOVERSION N/A 12/18/2020  Procedure: TRANSESOPHAGEAL ECHOCARDIOGRAM (TEE);  Surgeon: Larey Dresser, MD;  Location: [facility] ENDOSCOPY;  Service: Cardiovascular;  Laterality: N/A;  [place] Right 04/03/2018  Procedure: TOTAL HIP ARTHROPLASTY ANTERIOR APPROACH;  Surgeon: Paralee Cancel, MD;  Location: [facility];  Service: Orthopedics;  Laterality: Right;  70 mins HPI: Pt is a 80 y.o. male who was admitted 10/10 for confusion/decreased mentation, and poor intake, Pt found to have enterococcus faecalis bacteremia, encephalopathy, small left cerebellar vermis CVA, constipation with mild illeus which resolved. 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 Pt has a hx of cognitive deficits and dysphagia, with most recent MBS on 09/02/20 and most recent diet recommendations for Dysphagia 1 solids and honey-thick liquids.  Per family, pt has since advanced to mechanical soft solid and NTL.  Cortrak placed 12/18/20.  Subjective: Pt was lethargic Assessment / Plan / Recommendation CHL IP CLINICAL IMPRESSIONS 01/01/2021 Clinical Impression Pt presents with oropharyngeal dysphagia characterized by reduced bolus cohesion, reduced pharyngeal constriction, reduced anterior laryngeal movement and a pharyngeal delay. Brief oral holding was noted, but with adequate bolus containment. Premature spillage and the pharyngeal delay facilitated inconsistent penetration (PAS 5) with larger boluses of thin liquids via straw; however, no other instances of penetration/aspiration were noted during the study. Prompted coughing was ineffective in expelling the penetrate. Trace residue was noted in the pyriform sinuses and on the posterior pharyngeal wall, but this cleared with a liquid wash and secondary swallows. The barium tablet (taken whole with thin liquids) became lodged in the vallecule and subsequently in mid thoracic esophagus. However, transport to the esophagus was facilitated with a puree bolus, and esophageal clearance was achieved with a liquid wash. A dysphagia 3 diet with thin liquids is recommended at this time observance of swallowing precautions. SLP will continue to follow pt. SLP Visit Diagnosis Dysphagia, oropharyngeal phase (R13.12) Attention and concentration deficit following -- Frontal lobe and executive function deficit following -- Impact on safety and function Mild aspiration risk   CHL IP  TREATMENT RECOMMENDATION 01/01/2021 Treatment Recommendations Therapy as outlined in treatment plan below   Prognosis 01/01/2021 Prognosis for Safe Diet Advancement Fair Barriers to Reach Goals Cognitive deficits Barriers/Prognosis Comment -- CHL IP DIET RECOMMENDATION 01/01/2021 SLP Diet Recommendations Dysphagia 3 (Mech soft) solids;Thin liquid Liquid Administration via Cup;No straw Medication Administration Crushed with puree Compensations Slow rate;Small sips/bites Postural Changes Seated upright at 90 degrees   CHL IP OTHER RECOMMENDATIONS 01/01/2021 Recommended Consults -- Oral Care Recommendations Oral care BID Other Recommendations --   CHL IP FOLLOW UP RECOMMENDATIONS 01/01/2021 Follow up Recommendations 24 hour supervision/assistance;Skilled Nursing facility   [facility] IP FREQUENCY AND DURATION 01/01/2021 Speech Therapy Frequency (ACUTE ONLY) min 2x/week Treatment Duration 2 weeks      CHL IP ORAL PHASE 01/01/2021 Oral Phase Impaired Oral - Pudding Teaspoon -- Oral - Pudding Cup -- Oral - Honey Teaspoon -- Oral - Honey Cup -- Oral - Nectar Teaspoon -- Oral - Nectar Cup Holding of bolus Oral - Nectar Straw Holding of bolus Oral - Thin Teaspoon -- Oral - Thin Cup Holding of bolus Oral - Thin Straw Holding of bolus;Decreased bolus cohesion;Premature spillage Oral - Puree Holding of bolus Oral - Mech Soft -- Oral - Regular WFL Oral - Multi-Consistency -- Oral - Pill Holding of bolus Oral Phase - Comment --  CHL IP PHARYNGEAL PHASE 01/01/2021 Pharyngeal Phase Impaired Pharyngeal- Pudding Teaspoon -- Pharyngeal -- Pharyngeal- Pudding Cup -- Pharyngeal -- Pharyngeal- Honey Teaspoon -- Pharyngeal -- Pharyngeal- Honey Cup -- Pharyngeal -- Pharyngeal- Nectar Teaspoon -- Pharyngeal -- Pharyngeal- Nectar Cup Pharyngeal residue - pyriform;Reduced anterior laryngeal mobility Pharyngeal -- Pharyngeal- Nectar Straw Pharyngeal residue - pyriform;Reduced anterior laryngeal mobility Pharyngeal -- Pharyngeal- Thin Teaspoon --  Pharyngeal -- Pharyngeal- Thin Cup Pharyngeal residue - pyriform;Reduced anterior laryngeal mobility Pharyngeal -- Pharyngeal- Thin Straw Pharyngeal residue - pyriform;Reduced anterior laryngeal mobility;Penetration/Aspiration during swallow Pharyngeal Material enters airway, CONTACTS cords and not ejected out Pharyngeal- Puree Pharyngeal residue - pyriform;Reduced anterior laryngeal mobility Pharyngeal -- Pharyngeal- Mechanical Soft -- Pharyngeal -- Pharyngeal- Regular Pharyngeal residue - pyriform;Reduced anterior laryngeal mobility Pharyngeal -- Pharyngeal- Multi-consistency -- Pharyngeal -- Pharyngeal- Pill -- Pharyngeal -- Pharyngeal Comment --  CHL IP CERVICAL ESOPHAGEAL PHASE 01/01/2021 Cervical Esophageal Phase WFL Pudding Teaspoon -- Pudding Cup -- Honey Teaspoon -- Honey Cup -- Nectar Teaspoon -- Nectar  Cup -- [employer] -- Thin Teaspoon -- Thin Cup -- Thin Straw -- Puree -- Mechanical Soft -- Regular -- Multi-consistency -- Pill -- Cervical Esophageal Comment -- Shanika I. Hardin Negus, [place], [place] Office number [phone] Pager [place] 01/01/2021, 9:36 AM              EEG adult  Result Date: 12/17/2020 Lora Havens, MD     12/17/2020  1:00 PM Patient Name: TREON KEHL MRN: [id number] Epilepsy Attending: Lora Havens Referring Physician/Provider: Dr Oren Binet Date: 12/17/2020 Duration: 24.04 mins Patient history: 80 year old male with recent subdural hematoma in May, seizures who presented with altered mental status.  EEG to evaluate for seizures. Level of alertness: Awake AEDs during EEG study: LEV Technical aspects: This EEG study was done with scalp electrodes positioned according to the 10-20 International system of electrode placement. Electrical activity was acquired at a sampling rate of _0  and reviewed with a high frequency filter of _1  and a low frequency filter of _2 . EEG data were recorded continuously and  digitally stored. Description: The posterior dominant rhythm consists of 8-9 Hz activity of moderate voltage (25-35 uV) seen predominantly in posterior head regions, symmetric and reactive to eye opening and eye closing. EEG showed continuous low amplitude 2 to 3 Hz delta slowing in right frontotemporal region. Intermittent generalized 3 to 5 Hz theta-delta slowing was also noted. Hyperventilation and photic stimulation were not performed.   ABNORMALITY - Continuous slow, right frontotemporal region - Intermittent slow, generalized IMPRESSION: This study is suggestive of cortical dysfunction in right frontotemporal region likely secondary to underlying structural abnormality/stroke/subdural hemorrhage.  Additionally there is mild diffuse encephalopathy, nonspecific etiology.  No seizures or epileptiform discharges were seen throughout the recording.  If suspicion for ictal- interictal activity remains a concern, a prolonged study can be considered. Priyanka Barbra Sarks   Overnight EEG with video  Result Date: 12/18/2020 Lora Havens, MD     12/19/2020  7:36 AM Patient Name: DUJUAN STANKOWSKI MRN: [id number] Epilepsy Attending: Lora Havens Referring Physician/Provider: Dr Oren Binet Duration: 12/17/2020 1638 to 12/18/2020 1002  Patient history: 80 year old male with recent subdural hematoma in May, seizures who presented with altered mental status.  EEG to evaluate for seizures.  Level of alertness: Awake, asleep  AEDs during EEG study: LEV  Technical aspects: This EEG study was done with scalp electrodes positioned according to the 10-20 International system of electrode placement. Electrical activity was acquired at a sampling rate of _3  and reviewed with a high frequency filter of _4  and a low frequency filter of _5 . EEG data were recorded continuously and digitally stored.  Description: The posterior dominant rhythm consists of 8-9 Hz activity of moderate voltage (25-35 uV) seen predominantly in  posterior head regions, symmetric and reactive to eye opening and eye closing.  Sleep was characterized by vertex waves, sleep spindles (12 to 14 Hz), maximal frontocentral region.  EEG showed continuous low amplitude 2 to 3 Hz delta slowing in right frontotemporal region. Hyperventilation and photic stimulation were not performed.    ABNORMALITY - Continuous slow, right frontotemporal region  IMPRESSION: This study is suggestive of cortical dysfunction in right frontotemporal region likely secondary to underlying structural abnormality/stroke/subdural hemorrhage. No seizures or epileptiform discharges were seen throughout the recording. Lora Havens   ECHOCARDIOGRAM COMPLETE  Result Date: 12/15/2020    ECHOCARDIOGRAM REPORT   Patient Name:   Hollister CASHIS RILL Date of Exam: 12/15/2020 Medical Rec #:  [id number]     Height:       67.0 in Accession #:    [id number]    Weight:       166.2 lb Date of Birth:  Jan 17, 1941     BSA:          1.869 m Patient Age:    32 years      BP:           120/67 mmHg Patient Gender: M             HR:           98 bpm. Exam Location:  Inpatient Procedure: 2D Echo, Cardiac Doppler, Color Doppler and Intracardiac            Opacification Agent Indications:    Fever R50.9  History:        Patient has prior history of Echocardiogram examinations, most                 recent 08/07/2020. Stroke; Arrythmias:Atrial Fibrillation. Past                 history of rheumatic fever, acute rheumatic pericarditis.                 Thoracic aortic aneurysm.                 Aortic Valve: 27 mm Carpentier-Edwards prosthetic valve is                 present in the aortic position. Procedure Date: 10/18/2004.  Sonographer:    Darlina Sicilian RDCS Sonographer#2:  Darlina Sicilian RDCS Referring Phys: [id number] [place]  1. The mitral valve has been replaced with an unknown bioprosthesis. No evidence of mitral valve regurgitation. The mean mitral valve gradient is 10.0 mmHg with average heart  rate of 101 bpm. PHT 70 ms. Suboptimal LVOT spectral Doppler- VTI deferred. Date of Procedure Date: 10/18/2004.  2. The aortic valve has been repaired/replaced. Aortic valve regurgitation is not visualized. 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 Procedure Date: 10/18/2004. Aortic valve mean gradient measures 16.0 mmHg.  Aortic valve acceleration time measures 74 msec.  3. Left ventricular ejection fraction, by estimation, is 65 to 70%. The left ventricle has normal function. The left ventricle has no regional wall motion abnormalities. There is mild concentric left ventricular hypertrophy. Left ventricular diastolic parameters are indeterminate.  4. Aortic dilatation noted. 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  5. Right ventricular systolic function is normal. The right ventricular size is normal. Comparison(s): A prior study was performed on 08/07/2020. Prior images reviewed side by side. Stable aortic root and ascending aorta size. Increased mitral valve gradients, but at higher hear rates from prior. FINDINGS  Left Ventricle: Left ventricular ejection fraction, by estimation, is 65 to 70%. The left ventricle has normal function. The left ventricle has no regional wall motion abnormalities. Definity contrast agent was given IV to delineate the left ventricular  endocardial borders. The left ventricular internal cavity size was normal in size. There is mild concentric left ventricular hypertrophy. Left ventricular diastolic parameters are indeterminate. Right Ventricle: The right ventricular size is normal. No increase in right ventricular wall thickness. Right ventricular systolic function is normal. Left Atrium: Left atrial size was normal in size. Right Atrium: Right atrial size was normal in size. Pericardium: There  is no evidence of pericardial effusion. Mitral Valve:  The mitral valve has been repaired/replaced. No evidence of mitral valve regurgitation. There is a 29 mm Carpentier-Edwards prosthetic present in the mitral position. MV peak gradient, 17.1 mmHg. The mean mitral valve gradient is 10.0 mmHg with average heart rate of 101 bpm. Tricuspid Valve: The tricuspid valve is grossly normal. Tricuspid valve regurgitation is mild . No evidence of tricuspid stenosis. Aortic Valve: The aortic valve has been repaired/replaced. Aortic valve regurgitation is not visualized. Aortic valve mean gradient measures 16.0 mmHg. Aortic valve peak gradient measures 24.4 mmHg. Aortic valve area, by VTI measures 1.56 cm. There is a  27 mm Carpentier-Edwards prosthetic valve present in the aortic position. Procedure Date: 10/18/2004. Pulmonic Valve: The pulmonic valve was normal in structure. Pulmonic valve regurgitation is mild. No evidence of pulmonic stenosis. Aorta: The ascending aorta was not well visualized and aortic dilatation noted. 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 IAS/Shunts: The atrial septum is grossly normal.  LEFT VENTRICLE PLAX 2D LVIDd:         4.80 cm LVIDs:         3.50 cm LV PW:         1.30 cm LV IVS:        1.30 cm LVOT diam:     3.25 cm LV SV:         62 LV SV Index:   33 LVOT Area:     8.30 cm  LV Volumes (MOD) LV vol d, MOD A2C: 81.2 ml LV vol d, MOD A4C: 94.4 ml LV vol s, MOD A2C: 18.0 ml LV vol s, MOD A4C: 30.1 ml LV SV MOD A2C:     63.2 ml LV SV MOD A4C:     94.4 ml LV SV MOD BP:      63.9 ml RIGHT VENTRICLE RV S prime:     10.40 cm/s LEFT ATRIUM             Index        RIGHT ATRIUM          Index LA diam:        4.50 cm 2.41 cm/m   RA Area:     8.74 cm LA Vol (A2C):   58.1 ml 31.08 ml/m  RA Volume:   12.60 ml 6.74 ml/m LA Vol (A4C):   56.9 ml 30.44 ml/m LA Biplane Vol: 58.9 ml 31.51 ml/m  AORTIC VALVE AV Area (Vmax):    5.29 cm AV Area (Vmean):   1.55 cm AV  Area (VTI):     1.56 cm AV Vmax:           247.00 cm/s AV Vmean:          190.500 cm/s AV VTI:            0.401 m AV Peak Grad:      24.4 mmHg AV Mean Grad:      16.0 mmHg LVOT Vmax:         157.40 cm/s LVOT Vmean:        35.700 cm/s LVOT VTI:          0.075 m LVOT/AV VTI ratio: 0.19  AORTA Ao Asc diam: 4.90 cm MITRAL VALVE                TRICUSPID VALVE MV Area (PHT): 3.16 cm     TR Peak  grad:   26.4 mmHg MV Area VTI:   1.39 cm     TR Vmax:        257.00 cm/s MV Peak grad:  17.1 mmHg MV Mean grad:  10.0 mmHg    SHUNTS MV Vmax:       2.07 m/s     Systemic VTI:  0.08 m MV Vmean:      151.0 cm/s   Systemic Diam: 3.25 cm MV Decel Time: 240 msec MV E velocity: 195.00 cm/s Rudean Haskell MD Electronically signed by Rudean Haskell MD Signature Date/Time: 12/15/2020/12:49:20 PM    Final    [place] EKG SITE RITE  Result Date: 01/01/2021 If Site Rite image not attached, placement could not be confirmed due to current cardiac rhythm.   Assessment/Plan  1. Shortness of breath He appears to volume overloaded. CXR indicating bilateral opacities, weight gain, edema, wheezing, with low albumin. Less likely to be pna given that he is already on antibiotics with no cough or sputum production.  Will give Lasix 40 mg with 20 meq Kdur x 1 today. Monitor weight and output. Follow up xray already ordered.    Family/ staff Communication:  Discussed with his wife Herbert Pun Labs/tests ordered:   CBC and BMP next week, labs pending for today

## 2021-01-11 ENCOUNTER — Non-Acute Institutional Stay (SKILLED_NURSING_FACILITY): Payer: Medicare Other | Admitting: Internal Medicine

## 2021-01-11 ENCOUNTER — Encounter: Payer: Self-pay | Admitting: Internal Medicine

## 2021-01-11 DIAGNOSIS — M5459 Other low back pain: Secondary | ICD-10-CM | POA: Diagnosis not present

## 2021-01-11 DIAGNOSIS — R278 Other lack of coordination: Secondary | ICD-10-CM | POA: Diagnosis not present

## 2021-01-11 DIAGNOSIS — R601 Generalized edema: Secondary | ICD-10-CM | POA: Diagnosis not present

## 2021-01-11 DIAGNOSIS — D638 Anemia in other chronic diseases classified elsewhere: Secondary | ICD-10-CM | POA: Diagnosis not present

## 2021-01-11 DIAGNOSIS — T826XXD Infection and inflammatory reaction due to cardiac valve prosthesis, subsequent encounter: Secondary | ICD-10-CM

## 2021-01-11 DIAGNOSIS — H538 Other visual disturbances: Secondary | ICD-10-CM | POA: Diagnosis not present

## 2021-01-11 DIAGNOSIS — I482 Chronic atrial fibrillation, unspecified: Secondary | ICD-10-CM

## 2021-01-11 DIAGNOSIS — K5901 Slow transit constipation: Secondary | ICD-10-CM

## 2021-01-11 DIAGNOSIS — E785 Hyperlipidemia, unspecified: Secondary | ICD-10-CM

## 2021-01-11 DIAGNOSIS — E870 Hyperosmolality and hypernatremia: Secondary | ICD-10-CM | POA: Diagnosis not present

## 2021-01-11 DIAGNOSIS — S065X3S Traumatic subdural hemorrhage with loss of consciousness of 1 hour to 5 hours 59 minutes, sequela: Secondary | ICD-10-CM | POA: Diagnosis not present

## 2021-01-11 DIAGNOSIS — I69354 Hemiplegia and hemiparesis following cerebral infarction affecting left non-dominant side: Secondary | ICD-10-CM | POA: Diagnosis not present

## 2021-01-11 DIAGNOSIS — I69391 Dysphagia following cerebral infarction: Secondary | ICD-10-CM | POA: Diagnosis not present

## 2021-01-11 DIAGNOSIS — R799 Abnormal finding of blood chemistry, unspecified: Secondary | ICD-10-CM | POA: Diagnosis not present

## 2021-01-11 DIAGNOSIS — M6389 Disorders of muscle in diseases classified elsewhere, multiple sites: Secondary | ICD-10-CM | POA: Diagnosis not present

## 2021-01-11 DIAGNOSIS — I38 Endocarditis, valve unspecified: Secondary | ICD-10-CM

## 2021-01-11 DIAGNOSIS — I6601 Occlusion and stenosis of right middle cerebral artery: Secondary | ICD-10-CM | POA: Diagnosis not present

## 2021-01-11 DIAGNOSIS — R569 Unspecified convulsions: Secondary | ICD-10-CM | POA: Diagnosis not present

## 2021-01-11 DIAGNOSIS — I10 Essential (primary) hypertension: Secondary | ICD-10-CM | POA: Diagnosis not present

## 2021-01-11 DIAGNOSIS — I5032 Chronic diastolic (congestive) heart failure: Secondary | ICD-10-CM | POA: Diagnosis not present

## 2021-01-11 DIAGNOSIS — R2689 Other abnormalities of gait and mobility: Secondary | ICD-10-CM | POA: Diagnosis not present

## 2021-01-11 DIAGNOSIS — R1312 Dysphagia, oropharyngeal phase: Secondary | ICD-10-CM

## 2021-01-11 DIAGNOSIS — R41841 Cognitive communication deficit: Secondary | ICD-10-CM | POA: Diagnosis not present

## 2021-01-11 DIAGNOSIS — R0602 Shortness of breath: Secondary | ICD-10-CM

## 2021-01-11 DIAGNOSIS — R41840 Attention and concentration deficit: Secondary | ICD-10-CM | POA: Diagnosis not present

## 2021-01-11 LAB — CBC AND DIFFERENTIAL
HCT: 23 — AB (ref 41–53)
Hemoglobin: 7.7 — AB (ref 13.5–17.5)
Platelets: 335 (ref 150–399)
WBC: 10.1

## 2021-01-11 LAB — COMPREHENSIVE METABOLIC PANEL: Calcium: 7.2 — AB (ref 8.7–10.7)

## 2021-01-11 LAB — BASIC METABOLIC PANEL
BUN: 12 (ref 4–21)
CO2: 20 (ref 13–22)
Chloride: 114 — AB (ref 99–108)
Creatinine: 0.6 (ref 0.6–1.3)
Glucose: 75
Potassium: 3.2 — AB (ref 3.4–5.3)
Sodium: 152 — AB (ref 137–147)

## 2021-01-11 LAB — BRAIN NATRIURETIC PEPTIDE: B Natriuretic Peptide: 319

## 2021-01-11 LAB — CBC: RBC: 2.67 — AB (ref 3.87–5.11)

## 2021-01-11 LAB — POCT ERYTHROCYTE SEDIMENTATION RATE, NON-AUTOMATED: Sed Rate: 68

## 2021-01-11 NOTE — Progress Notes (Signed)
Provider:  Veleta Miners MD  Location:   [facility] Room Number: 174 Place of Service:  SNF ([phone])  PCP: [name], MD Patient Care Team: [name], MD as PCP - General (Internal Medicine) Larey Dresser, MD as PCP - Cardiology (Cardiology) [name], MD as Consulting Physician (Ophthalmology) [name], MD (Internal Medicine)  Extended Emergency Contact Information Primary Emergency Contact: Eline,Agnes S Address: [place], [place] [address] [name] of [place] Phone: [phone] Relation: Spouse Secondary Emergency Contact: [name], [place] [address] [name] of [place] Phone: [phone] Relation: Son  Code Status: DNR Goals of Care: Advanced Directive information Advanced Directives 01/11/2021  Does Patient Have a Medical Advance Directive? Yes  Type of Paramedic of [place];Living will;Out of facility DNR (pink MOST or yellow form)  Does patient want to make changes to medical advance directive? No - Patient declined  Copy of [place] in Chart? Yes - validated most recent copy scanned in chart (See row information)  Would patient like information on creating a medical advance directive? -  Pre-existing out of facility DNR order (yellow form or pink MOST form) -      Chief Complaint  Patient presents with   ReAdmit To SNF    Admission to SNF    HPI: Patient is a 80 y.o. 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 Due to Infected Prosthetic Valve Not candidate for surgery On Rocephin and Ampicillin till 11/24 and then on Ampicillin for ever Acute Left Cerebellar Vermis CVA which was new due to Septic Emboli Hypernatremia Continuous issue due to decreased PO intake Had Cortrak in the hospital Doing well here in the facility now. Seizure Prophylaxis Keprra changed to Vimpat due to Lethargy Encephalopathy Has improved and Mental status much better Constipation with Ileus Dysphagia   Per wife today he continues to do well. He did have SOB few days ago Seen buy Christy CXR showed moderate patchy bilateral densities right greater than left,  Was treated with Lasix one dose much Better but weight still up Though has lost 8 lbs since got that dose of Lasix and still has some wheezing and SOB   Has not started working with the PT yet Working with OT and Speech Was very alert. Does respond some but then get distracted. 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    Other and unspecified hyperlipidemia    Pre-diabetes    Previous back surgery 1978, jan 2007   Psychosexual dysfunction with inhibited sexual excitement    SOB (shortness of breath)    "with heavy exercise"   Stroke ([place]) 08/2013   "I WAS IN AFIB AND THREW A CLOT, THE EFFECTS WERE TRANSITORY AND DIDNT LAST  BUT FOR 30 MINUTES"    Thoracic aortic aneurysm    Past Surgical History:  Procedure Laterality Date   AORTIC AND MITRAL VALVE REPLACEMENT     09/2004   CARDIOVERSION     3 times from 2004-2006   CARDIOVERSION N/A 09/26/2013   Procedure: CARDIOVERSION;  Surgeon: Larey Dresser, MD;  Location: [facility];  Service: Cardiovascular;  Laterality: N/A;   CARDIOVERSION N/A 06/19/2014   Procedure: CARDIOVERSION;  Surgeon: Jerline Pain, MD;  Location: [facility];  Service: Cardiovascular;  Laterality: N/A;   CARDIOVERSION N/A 04/24/2017   Procedure: CARDIOVERSION;  Surgeon: Larey Dresser, MD;  Location: [facility];  Service: Cardiovascular;  Laterality: N/A;   CARDIOVERSION N/A 06/08/2017   Procedure: CARDIOVERSION;  Surgeon: Pixie Casino, MD;  Location: [facility] ENDOSCOPY;  Service: Cardiovascular;  Laterality: N/A;   CARDIOVERSION N/A 08/24/2017   Procedure: CARDIOVERSION;  Surgeon: Skeet Latch, MD;  Location: [facility] ENDOSCOPY;  Service: Cardiovascular;  Laterality: N/A;   CARDIOVERSION N/A 02/14/2018   Procedure: CARDIOVERSION;  Surgeon: Larey Dresser, MD;  Location: [facility] ENDOSCOPY;  Service: Cardiovascular;  Laterality: N/A;   COLONOSCOPY     CRANIOTOMY N/A 07/28/2020   Procedure: CRANIOTOMY FOR EVACUATION OF SUBDURAL HEMATOMA;  Surgeon: Eustace Vonada, MD;  Location: [place];  Service: Neurosurgery;  Laterality: N/A;   IR ANGIO VERTEBRAL SEL SUBCLAVIAN INNOMINATE UNI R MOD SED  08/07/2020   IR CT HEAD LTD  08/07/2020   IR PERCUTANEOUS ART THROMBECTOMY/INFUSION INTRACRANIAL INC DIAG ANGIO  08/07/2020   laminectomies     10/1975 and in 03/2005   RADIOLOGY WITH ANESTHESIA N/A 08/07/2020   Procedure: IR WITH ANESTHESIA;  Surgeon: Luanne Bras, MD;  Location: [facility];  Service: Radiology;  Laterality: N/A;   TEE WITHOUT CARDIOVERSION N/A 12/18/2020   Procedure: TRANSESOPHAGEAL ECHOCARDIOGRAM (TEE);  Surgeon: Larey Dresser, MD;  Location: [facility] ENDOSCOPY;  Service: Cardiovascular;  Laterality:  N/A;   [place] Right 04/03/2018   Procedure: TOTAL HIP ARTHROPLASTY ANTERIOR APPROACH;  Surgeon: Paralee Cancel, MD;  Location: [facility];  Service: Orthopedics;  Laterality: Right;  70 mins    reports that he has never smoked. He has never used smokeless tobacco. He reports that he does not drink alcohol and does not use drugs. Social History   Socioeconomic History   Marital status: Married    Spouse name: Herbert Pun   Number of children: 2   Years of education: BS   Highest education level: Not on file  Occupational History   Occupation: retired  Tobacco Use   Smoking status: Never   Smokeless tobacco: Never  Vaping Use   Vaping Use: Never used  Substance and Sexual Activity   Alcohol use: Never    Comment: wine occasionally   Drug use: Never   Sexual activity: Yes    Partners: Female  Other Topics Concern   Not on file  Social History Narrative   10/27/20 residing at [facility]  care center since 09/17/20    [employer] - BS, JD. married - '67. 2 sons - '74, '77  one son is a Nurse, learning disability for EMS - [employer]; 4 grandchildren. 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 marriage in good health. End-of-life: discussed issues and provided packet with the MOST form and out of facility order.   Social Determinants of Health   Financial Resource Strain: Not on file  Food Insecurity: Not on file  Transportation Needs: Not on file  Physical Activity: Not on file  Stress: Not on file  Social Connections: Not on file  Intimate Partner Violence: Not on file    Functional Status Survey:    Family History  Problem Relation Age of Onset   Macular degeneration Mother        macular degeneration   Cancer Father        intestinal/GI   Diabetes Paternal Aunt    Heart attack Maternal Grandfather    Diabetes Brother     Health Maintenance  Topic Date Due   FOOT EXAM   Never done   URINE MICROALBUMIN  Never done   COVID-19 Vaccine (3 - Moderna risk series) 07/24/2020   OPHTHALMOLOGY EXAM  04/07/2021 (Originally 10/04/1950)   HEMOGLOBIN A1C  06/17/2021   TETANUS/TDAP  03/22/2028   Pneumonia Vaccine 30+ Years old  Completed   INFLUENZA VACCINE  Completed   Zoster Vaccines- Shingrix  Completed   HPV VACCINES  Aged Out   Hepatitis C Screening  Discontinued    Allergies  Allergen Reactions   Naproxen Hives   No [facility] Food Allergies Other (See Comments)    Scallops - distress, nausea and vomitting    Allergies as of 01/11/2021       Reactions   Naproxen Hives   No [facility] Food Allergies Other (See Comments)   Scallops - distress, nausea and vomitting        Medication List        Accurate as of January 11, 2021  9:13 AM. If you have any questions, ask your nurse or doctor.          amantadine 50 MG/5ML solution Commonly known as: SYMMETREL 8 AM and 2 PM   amLODipine 10 MG tablet Commonly known as: NORVASC Take 1 tablet (10 mg total) by mouth daily.   ampicillin  IVPB Inject 12 g into the vein daily. As a continuous infusion. Indication:  Enterococcus endocarditis  First Dose: Yes Last Day of Therapy:  01/28/21 Labs - Once weekly:  CBC/D and BMP, Labs - Every other week:  ESR and CRP Method of administration: Ambulatory Pump (Continuous Infusion) Method of administration may be changed at the discretion of home infusion pharmacist based upon assessment of the patient and/or caregiver's ability to self-administer the medication ordered.   apixaban 5 MG Tabs tablet Commonly known as: ELIQUIS Take 1 tablet (5 mg total) by mouth 2 (two) times daily.   atorvastatin 80 MG tablet Commonly known as: LIPITOR Take 1 tablet (80 mg total) by mouth daily.   bisacodyl 10 MG suppository Commonly known as: DULCOLAX Place 1 suppository (10 mg total) rectally daily as needed for moderate constipation.   cefTRIAXone   IVPB Commonly known as: ROCEPHIN Inject 2 g into the vein every 12 (twelve) hours. Indication:  Enterococcal endocarditis  First Dose: Yes Last Day of Therapy:  01/28/21 Labs - Once weekly:  CBC/D and BMP, Labs - Every other  week:  ESR and CRP Method of administration: IV Push Method of administration may be changed at the discretion of home infusion pharmacist based upon assessment of the patient and/or caregiver's ability to self-administer the medication ordered.   clobetasol cream 0.05 % Commonly known as: TEMOVATE Apply 1 application topically daily as needed (for skin irritation).   docusate sodium 100 MG capsule Commonly known as: Colace Take 1 capsule (100 mg total) by mouth 2 (two) times daily.   dofetilide 125 MCG capsule Commonly known as: TIKOSYN Take 3 capsules (375 mcg total) by mouth 2 (two) times daily.   famotidine 20 MG tablet Commonly known as: PEPCID Take 20 mg by mouth at bedtime as needed.   feeding supplement (GLUCERNA SHAKE) Liqd Take 237 mLs by mouth 2 (two) times daily between meals. For weight loss   finasteride 5 MG tablet Commonly known as: PROSCAR Take 1 tablet (5 mg total) by mouth daily.   hydrocortisone cream 1 % Apply topically 2 (two) times daily as needed for itching (rash).   ipratropium-albuterol 0.5-2.5 (3) MG/3ML Soln Commonly known as: DUONEB Take 3 mLs by nebulization every 6 (six) hours as needed.   lacosamide 50 MG Tabs tablet Commonly known as: VIMPAT Take 1 tablet (50 mg total) by mouth 2 (two) times daily.   loratadine 10 MG tablet Commonly known as: CLARITIN Take 10 mg by mouth at bedtime.   magnesium gluconate 500 MG tablet Commonly known as: MAGONATE Take 1 tablet (500 mg total) by mouth daily.   multivitamin with minerals Tabs tablet Take 1 tablet by mouth daily.   polyethylene glycol 17 g packet Commonly known as: MIRALAX / GLYCOLAX Take 17 g by mouth daily as needed.   polyvinyl alcohol 1.4 % ophthalmic  solution Commonly known as: LIQUIFILM TEARS Place 1 drop into both eyes 2 (two) times daily.   tamsulosin 0.4 MG Caps capsule Commonly known as: FLOMAX Take 0.4 mg by mouth daily.        Review of Systems  Constitutional:  Positive for activity change, appetite change and unexpected weight change.  HENT: Negative.    Respiratory:  Positive for cough, shortness of breath and wheezing.   Cardiovascular: Negative.   Gastrointestinal: Negative.   Genitourinary: Negative.   Musculoskeletal:  Positive for gait problem.  Skin:  Positive for wound.  Neurological:  Positive for weakness.  Psychiatric/Behavioral:  Positive for confusion.    Vitals:   01/11/21 0851  BP: 133/80  Pulse: 91  Resp: 18  Temp: 98.1 F (36.7 C)  SpO2: 93%  Weight: 180 lb 3.2 oz (81.7 kg)  Height: 5\' 7"  (1.702 m)   Body mass index is 28.22 kg/m. Physical Exam Vitals reviewed.  Constitutional:      Appearance: Normal appearance.  HENT:     Head: Normocephalic.     Nose: Nose normal.     Mouth/Throat:     Mouth: Mucous membranes are moist.     Pharynx: Oropharynx is clear.  Eyes:     Pupils: Pupils are equal, round, and reactive to light.  Cardiovascular:     Rate and Rhythm: Normal rate and regular rhythm.     Pulses: Normal pulses.     Heart sounds: Murmur heard.  Pulmonary:     Effort: Pulmonary effort is normal. No respiratory distress.     Breath sounds: No rales.  Abdominal:     General: Abdomen is flat. Bowel sounds are normal.     Palpations: Abdomen is soft.  Musculoskeletal:        General: No swelling.     Cervical back: Neck supple.  Skin:    General: Skin is warm.  Neurological:     Mental Status: He is alert.     Comments: Alert responds with Few Sentences. Does not follow Commands Has left Hemiparesis  Psychiatric:        Mood and Affect: Mood normal.        Thought Content: Thought content normal.    Labs reviewed: Basic Metabolic Panel: Recent Labs     12/18/20 1630 12/19/20 0610 12/19/20 1632 12/20/20 0153 01/01/21 1817 01/02/21 0240 01/03/21 0221 01/08/21 0000  NA  --  139  --    < > 138 137 138 141  K  --  3.5  --    < > 3.8 4.3 3.7 3.6  CL  --  108  --    < > 109 106 109 107  CO2  --  23  --    < > $R'22 23 23 'UR$ 24*  GLUCOSE  --  186*  --    < > 135* 122* 100*  --   BUN  --  16  --    < > $R'13 15 17 15  'mt$ CREATININE  --  0.76  --    < > 0.84 0.88 0.93 0.6  CALCIUM  --  7.7*  --    < > 8.2* 8.4* 8.3* 8.4*  MG 1.9 2.0 1.9   < > 2.1 2.0 2.0  --   PHOS 3.1 2.7 2.4*  --   --   --   --   --    < > = values in this interval not displayed.   Liver Function Tests: Recent Labs    01/01/21 0042 01/02/21 0240 01/03/21 0221 01/08/21 0000  AST 37 52* 32 26  ALT 68* 77* 63* 42*  ALKPHOS 88 105 94 120  BILITOT 0.5 0.7 0.4  --   PROT 6.3* 6.4* 6.3*  --   ALBUMIN 1.9* 2.0* 1.9* 2.8*   No results for input(s): LIPASE, AMYLASE in the last 8760 hours. Recent Labs    12/14/20 1639  AMMONIA 25   CBC: Recent Labs    08/16/20 0427 08/17/20 0340 12/14/20 1245 12/14/20 1504 12/16/20 0130 12/18/20 0221 12/31/20 0101 01/01/21 0042 01/02/21 0240 01/08/21 0000  WBC 7.9   < > 12.0*   < > 10.7*   < > 13.0* 10.5 10.3 10.9  NEUTROABS 5.7  --  9.3*  --  7.7  --   --   --   --   --   HGB 9.0*   < > 11.7*   < > 9.9*   < > 8.2* 7.6* 7.9* 8.0*  HCT 28.2*   < > 36.8*   < > 30.7*   < > 25.7* 24.4* 25.0* 24*  MCV 98.6   < > 90.0   < > 88.0   < > 89.9 91.4 92.6  --   PLT 177   < > 119*   < > 106*   < > 268 252 254  --    < > = values in this interval not displayed.   Cardiac Enzymes: No results for input(s): CKTOTAL, CKMB, CKMBINDEX, TROPONINI in the last 8760 hours. BNP: Invalid input(s): POCBNP Lab Results  Component Value Date   HGBA1C 7.3 (H) 12/17/2020   Lab Results  Component Value Date   TSH 1.567 12/14/2020   Lab  Results  Component Value Date   VITAMINB12 3,210 (H) 08/13/2020   Lab Results  Component Value Date   FOLATE 22.5  05/19/2016   Lab Results  Component Value Date   IRON 71 05/19/2016   FERRITIN 226.4 05/19/2016    Imaging and Procedures obtained prior to SNF admission: CT Head Wo Contrast  Result Date: 12/14/2020 CLINICAL DATA:  Mental status change, unknown cause EXAM: CT HEAD WITHOUT CONTRAST TECHNIQUE: Contiguous axial images were obtained from the base of the skull through the vertex without intravenous contrast. 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 Old left parietal infarct. Findings are stable since prior study. Small right subdural hematoma again noted overlying the right frontoparietal lobe, 3 mm compared to 5 mm previously. There is atrophy and chronic small vessel disease changes. Ventriculomegaly likely rib related to ex vacuo dilatation. No acute infarct or acute hemorrhage. Vascular: No hyperdense vessel or unexpected calcification. Skull: Prior right temporoparietal craniotomy. No acute calvarial abnormality. Sinuses/Orbits: No acute findings Other: None IMPRESSION: Small right subdural hematoma, 3 mm compared with 5 mm previously. No new hemorrhage. Old right MCA and left parietal infarcts, stable. Ventriculomegaly related to ex vacuo dilatation. Atrophy, chronic microvascular disease. No acute intracranial abnormality. Electronically Signed   By: Rolm Baptise M.D.   On: 12/14/2020 13:50   DG Chest Port 1 View  Result Date: 12/14/2020 CLINICAL DATA:  Altered mental status EXAM: PORTABLE CHEST 1 VIEW COMPARISON:  Chest radiograph 08/07/2020 FINDINGS: Median sternotomy wires and mitral and aortic valve prostheses are stable. The cardiomediastinal silhouette is stable. There is calcified atherosclerotic plaque of the aortic arch. Lung volumes are low. There is no focal consolidation or pulmonary edema. There is no pleural effusion or pneumothorax. There is no acute osseous abnormality. IMPRESSION: Low lung volumes. Otherwise, no radiographic evidence  of acute cardiopulmonary process. Electronically Signed   By: Valetta Mole M.D.   On: 12/14/2020 13:01   ECHOCARDIOGRAM COMPLETE  Result Date: 12/15/2020    ECHOCARDIOGRAM REPORT   Patient Name:   [name] [name] Date of Exam: 12/15/2020 Medical Rec #:  [id number]     Height:       67.0 in Accession #:    [id number]    Weight:       166.2 lb Date of Birth:  December 18, 1940     BSA:          1.869 m Patient Age:    89 years      BP:           120/67 mmHg Patient Gender: M             HR:           98 bpm. Exam Location:  Inpatient Procedure: 2D Echo, Cardiac Doppler, Color Doppler and Intracardiac            Opacification Agent Indications:    Fever R50.9  History:        Patient has prior history of Echocardiogram examinations, most                 recent 08/07/2020. Stroke; Arrythmias:Atrial Fibrillation. Past                 history of rheumatic fever, acute rheumatic pericarditis.                 Thoracic aortic aneurysm.                 Aortic Valve:  27 mm Carpentier-Edwards prosthetic valve is                 present in the aortic position. Procedure Date: 10/18/2004.  Sonographer:    Darlina Sicilian RDCS Sonographer#2:  Darlina Sicilian RDCS Referring Phys: [id number] [place]  1. The mitral valve has been replaced with an unknown bioprosthesis. No evidence of mitral valve regurgitation. The mean mitral valve gradient is 10.0 mmHg with average heart rate of 101 bpm. PHT 70 ms. Suboptimal LVOT spectral Doppler- VTI deferred. Date of Procedure Date: 10/18/2004.  2. The aortic valve has been repaired/replaced. Aortic valve regurgitation is not visualized. 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 Procedure Date: 10/18/2004. Aortic valve mean gradient measures 16.0 mmHg.  Aortic valve acceleration time measures 74 msec.  3. Left ventricular ejection fraction, by estimation, is 65 to 70%. The left ventricle has normal function. The left ventricle has no  regional wall motion abnormalities. There is mild concentric left ventricular hypertrophy. Left ventricular diastolic parameters are indeterminate.  4. Aortic dilatation noted. 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  5. Right ventricular systolic function is normal. The right ventricular size is normal. Comparison(s): A prior study was performed on 08/07/2020. Prior images reviewed side by side. Stable aortic root and ascending aorta size. Increased mitral valve gradients, but at higher hear rates from prior. FINDINGS  Left Ventricle: Left ventricular ejection fraction, by estimation, is 65 to 70%. The left ventricle has normal function. The left ventricle has no regional wall motion abnormalities. Definity contrast agent was given IV to delineate the left ventricular  endocardial borders. The left ventricular internal cavity size was normal in size. There is mild concentric left ventricular hypertrophy. Left ventricular diastolic parameters are indeterminate. Right Ventricle: The right ventricular size is normal. No increase in right ventricular wall thickness. Right ventricular systolic function is normal. Left Atrium: Left atrial size was normal in size. Right Atrium: Right atrial size was normal in size. Pericardium: There is no evidence of pericardial effusion. Mitral Valve: The mitral valve has been repaired/replaced. No evidence of mitral valve regurgitation. There is a 29 mm Carpentier-Edwards prosthetic present in the mitral position. MV peak gradient, 17.1 mmHg. The mean mitral valve gradient is 10.0 mmHg with average heart rate of 101 bpm. Tricuspid Valve: The tricuspid valve is grossly normal. Tricuspid valve regurgitation is mild . No evidence of tricuspid stenosis. Aortic Valve: The aortic valve has been repaired/replaced. Aortic valve regurgitation is not visualized. Aortic valve mean gradient measures 16.0  mmHg. Aortic valve peak gradient measures 24.4 mmHg. Aortic valve area, by VTI measures 1.56 cm. There is a  27 mm Carpentier-Edwards prosthetic valve present in the aortic position. Procedure Date: 10/18/2004. Pulmonic Valve: The pulmonic valve was normal in structure. Pulmonic valve regurgitation is mild. No evidence of pulmonic stenosis. Aorta: The ascending aorta was not well visualized and aortic dilatation noted. 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 IAS/Shunts: The atrial septum is grossly normal.  LEFT VENTRICLE PLAX 2D LVIDd:         4.80 cm LVIDs:         3.50 cm LV PW:         1.30 cm LV IVS:        1.30 cm LVOT diam:  3.25 cm LV SV:         62 LV SV Index:   33 LVOT Area:     8.30 cm  LV Volumes (MOD) LV vol d, MOD A2C: 81.2 ml LV vol d, MOD A4C: 94.4 ml LV vol s, MOD A2C: 18.0 ml LV vol s, MOD A4C: 30.1 ml LV SV MOD A2C:     63.2 ml LV SV MOD A4C:     94.4 ml LV SV MOD BP:      63.9 ml RIGHT VENTRICLE RV S prime:     10.40 cm/s LEFT ATRIUM             Index        RIGHT ATRIUM          Index LA diam:        4.50 cm 2.41 cm/m   RA Area:     8.74 cm LA Vol (A2C):   58.1 ml 31.08 ml/m  RA Volume:   12.60 ml 6.74 ml/m LA Vol (A4C):   56.9 ml 30.44 ml/m LA Biplane Vol: 58.9 ml 31.51 ml/m  AORTIC VALVE AV Area (Vmax):    5.29 cm AV Area (Vmean):   1.55 cm AV Area (VTI):     1.56 cm AV Vmax:           247.00 cm/s AV Vmean:          190.500 cm/s AV VTI:            0.401 m AV Peak Grad:      24.4 mmHg AV Mean Grad:      16.0 mmHg LVOT Vmax:         157.40 cm/s LVOT Vmean:        35.700 cm/s LVOT VTI:          0.075 m LVOT/AV VTI ratio: 0.19  AORTA Ao Asc diam: 4.90 cm MITRAL VALVE                TRICUSPID VALVE MV Area (PHT): 3.16 cm     TR Peak grad:   26.4 mmHg MV Area VTI:   1.39 cm     TR Vmax:        257.00 cm/s MV Peak grad:  17.1 mmHg MV Mean grad:  10.0 mmHg    SHUNTS MV Vmax:       2.07 m/s      Systemic VTI:  0.08 m MV Vmean:      151.0 cm/s   Systemic Diam: 3.25 cm MV Decel Time: 240 msec MV E velocity: 195.00 cm/s Rudean Haskell MD Electronically signed by Rudean Haskell MD Signature Date/Time: 12/15/2020/12:49:20 PM    Final     Assessment/Plan Shortness of breath Chest Xray had moderate patchy bilateral densities right greater than left Lasix did help Will Do 20 mg QD for 7 days Continue to follow BMP closely Also Follow up with the Cardiology  Prosthetic valve endocarditis, subsequent encounter On Rocephin and Ampicillin Will Continue on Ampicillin for indefinite  Traumatic subdural hemorrhage  Restart with Therapy Also on Vimpat for Seizure Prophylaxis EEG done  this admission did nto show and Seizure activity On Amantadine for Stimulation 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) ([place]) On Eliquis Also on Statin Restart working with therapy  Oropharyngeal dysphagia D3 with Aspiration Precautions  Slow transit constipation Will start him on Miralax and senna PRN  Atrial fibrillation, chronic (HCC) On Tikosyn and Eliquis Seizures Prophylaxis  On Vimpat Anemia of chronic disease Will start on low dose of Iron and see if it helps Hypernatremia Closely follow BMP Essential hypertension Stable on Norvasc Hyperlipidemia, unspecified hyperlipidemia type On statin Increased Liver Function They are in good limits now Work in the hospital was negative BPH On Proscar and Flomax  Addended His Labs came back and his Sodium was 152 Lasix was discontinued  Encourage PO Water Also getting 500 cc of 1/2 NS Hgb is 7.7  Family/ staff Communication:   Labs/tests ordered:

## 2021-01-12 DIAGNOSIS — I69391 Dysphagia following cerebral infarction: Secondary | ICD-10-CM | POA: Diagnosis not present

## 2021-01-12 DIAGNOSIS — I5032 Chronic diastolic (congestive) heart failure: Secondary | ICD-10-CM | POA: Diagnosis not present

## 2021-01-12 DIAGNOSIS — R601 Generalized edema: Secondary | ICD-10-CM | POA: Diagnosis not present

## 2021-01-12 DIAGNOSIS — R41840 Attention and concentration deficit: Secondary | ICD-10-CM | POA: Diagnosis not present

## 2021-01-12 DIAGNOSIS — I10 Essential (primary) hypertension: Secondary | ICD-10-CM | POA: Diagnosis not present

## 2021-01-12 DIAGNOSIS — M6389 Disorders of muscle in diseases classified elsewhere, multiple sites: Secondary | ICD-10-CM | POA: Diagnosis not present

## 2021-01-12 DIAGNOSIS — R2689 Other abnormalities of gait and mobility: Secondary | ICD-10-CM | POA: Diagnosis not present

## 2021-01-12 DIAGNOSIS — S065X3S Traumatic subdural hemorrhage with loss of consciousness of 1 hour to 5 hours 59 minutes, sequela: Secondary | ICD-10-CM | POA: Diagnosis not present

## 2021-01-12 DIAGNOSIS — H538 Other visual disturbances: Secondary | ICD-10-CM | POA: Diagnosis not present

## 2021-01-12 DIAGNOSIS — R278 Other lack of coordination: Secondary | ICD-10-CM | POA: Diagnosis not present

## 2021-01-12 DIAGNOSIS — R41841 Cognitive communication deficit: Secondary | ICD-10-CM | POA: Diagnosis not present

## 2021-01-12 DIAGNOSIS — M5459 Other low back pain: Secondary | ICD-10-CM | POA: Diagnosis not present

## 2021-01-12 DIAGNOSIS — I6601 Occlusion and stenosis of right middle cerebral artery: Secondary | ICD-10-CM | POA: Diagnosis not present

## 2021-01-13 DIAGNOSIS — I6601 Occlusion and stenosis of right middle cerebral artery: Secondary | ICD-10-CM | POA: Diagnosis not present

## 2021-01-13 DIAGNOSIS — M6389 Disorders of muscle in diseases classified elsewhere, multiple sites: Secondary | ICD-10-CM | POA: Diagnosis not present

## 2021-01-13 DIAGNOSIS — R41841 Cognitive communication deficit: Secondary | ICD-10-CM | POA: Diagnosis not present

## 2021-01-13 DIAGNOSIS — S065X3S Traumatic subdural hemorrhage with loss of consciousness of 1 hour to 5 hours 59 minutes, sequela: Secondary | ICD-10-CM | POA: Diagnosis not present

## 2021-01-13 DIAGNOSIS — R41840 Attention and concentration deficit: Secondary | ICD-10-CM | POA: Diagnosis not present

## 2021-01-13 DIAGNOSIS — A419 Sepsis, unspecified organism: Secondary | ICD-10-CM | POA: Diagnosis not present

## 2021-01-13 DIAGNOSIS — I2510 Atherosclerotic heart disease of native coronary artery without angina pectoris: Secondary | ICD-10-CM | POA: Diagnosis not present

## 2021-01-13 DIAGNOSIS — M5459 Other low back pain: Secondary | ICD-10-CM | POA: Diagnosis not present

## 2021-01-13 DIAGNOSIS — A4189 Other specified sepsis: Secondary | ICD-10-CM | POA: Diagnosis not present

## 2021-01-13 DIAGNOSIS — I10 Essential (primary) hypertension: Secondary | ICD-10-CM | POA: Diagnosis not present

## 2021-01-13 DIAGNOSIS — H538 Other visual disturbances: Secondary | ICD-10-CM | POA: Diagnosis not present

## 2021-01-13 DIAGNOSIS — G9341 Metabolic encephalopathy: Secondary | ICD-10-CM | POA: Diagnosis not present

## 2021-01-13 DIAGNOSIS — R700 Elevated erythrocyte sedimentation rate: Secondary | ICD-10-CM | POA: Diagnosis not present

## 2021-01-13 DIAGNOSIS — E860 Dehydration: Secondary | ICD-10-CM | POA: Diagnosis not present

## 2021-01-13 DIAGNOSIS — R601 Generalized edema: Secondary | ICD-10-CM | POA: Diagnosis not present

## 2021-01-13 DIAGNOSIS — I5032 Chronic diastolic (congestive) heart failure: Secondary | ICD-10-CM | POA: Diagnosis not present

## 2021-01-13 DIAGNOSIS — R2689 Other abnormalities of gait and mobility: Secondary | ICD-10-CM | POA: Diagnosis not present

## 2021-01-13 DIAGNOSIS — I69391 Dysphagia following cerebral infarction: Secondary | ICD-10-CM | POA: Diagnosis not present

## 2021-01-13 DIAGNOSIS — R278 Other lack of coordination: Secondary | ICD-10-CM | POA: Diagnosis not present

## 2021-01-13 DIAGNOSIS — I38 Endocarditis, valve unspecified: Secondary | ICD-10-CM | POA: Diagnosis not present

## 2021-01-13 LAB — BASIC METABOLIC PANEL
BUN: 15 (ref 4–21)
CO2: 22 (ref 13–22)
Chloride: 109 — AB (ref 99–108)
Creatinine: 0.8 (ref 0.6–1.3)
Glucose: 91
Potassium: 4.6 (ref 3.4–5.3)
Sodium: 143 (ref 137–147)

## 2021-01-13 LAB — CBC AND DIFFERENTIAL
HCT: 28 — AB (ref 41–53)
Hemoglobin: 8.7 — AB (ref 13.5–17.5)
Platelets: 413 — AB (ref 150–399)
WBC: 14.1

## 2021-01-13 LAB — CBC: RBC: 3.2 — AB (ref 3.87–5.11)

## 2021-01-13 LAB — POCT ERYTHROCYTE SEDIMENTATION RATE, NON-AUTOMATED: Sed Rate: 97

## 2021-01-13 LAB — COMPREHENSIVE METABOLIC PANEL: Calcium: 8.6 — AB (ref 8.7–10.7)

## 2021-01-14 ENCOUNTER — Encounter: Payer: Self-pay | Admitting: Internal Medicine

## 2021-01-14 ENCOUNTER — Telehealth: Payer: Self-pay

## 2021-01-14 ENCOUNTER — Non-Acute Institutional Stay (SKILLED_NURSING_FACILITY): Payer: Medicare Other | Admitting: Adult Health

## 2021-01-14 DIAGNOSIS — A499 Bacterial infection, unspecified: Secondary | ICD-10-CM | POA: Diagnosis not present

## 2021-01-14 DIAGNOSIS — R41841 Cognitive communication deficit: Secondary | ICD-10-CM | POA: Diagnosis not present

## 2021-01-14 DIAGNOSIS — R638 Other symptoms and signs concerning food and fluid intake: Secondary | ICD-10-CM

## 2021-01-14 DIAGNOSIS — H538 Other visual disturbances: Secondary | ICD-10-CM | POA: Diagnosis not present

## 2021-01-14 DIAGNOSIS — R0602 Shortness of breath: Secondary | ICD-10-CM

## 2021-01-14 DIAGNOSIS — T826XXD Infection and inflammatory reaction due to cardiac valve prosthesis, subsequent encounter: Secondary | ICD-10-CM

## 2021-01-14 DIAGNOSIS — R278 Other lack of coordination: Secondary | ICD-10-CM | POA: Diagnosis not present

## 2021-01-14 DIAGNOSIS — A4189 Other specified sepsis: Secondary | ICD-10-CM | POA: Diagnosis not present

## 2021-01-14 DIAGNOSIS — I69391 Dysphagia following cerebral infarction: Secondary | ICD-10-CM | POA: Diagnosis not present

## 2021-01-14 DIAGNOSIS — G9341 Metabolic encephalopathy: Secondary | ICD-10-CM | POA: Diagnosis not present

## 2021-01-14 DIAGNOSIS — R601 Generalized edema: Secondary | ICD-10-CM | POA: Diagnosis not present

## 2021-01-14 DIAGNOSIS — R509 Fever, unspecified: Secondary | ICD-10-CM

## 2021-01-14 DIAGNOSIS — I38 Endocarditis, valve unspecified: Secondary | ICD-10-CM

## 2021-01-14 DIAGNOSIS — I5032 Chronic diastolic (congestive) heart failure: Secondary | ICD-10-CM | POA: Diagnosis not present

## 2021-01-14 DIAGNOSIS — I69354 Hemiplegia and hemiparesis following cerebral infarction affecting left non-dominant side: Secondary | ICD-10-CM

## 2021-01-14 DIAGNOSIS — I10 Essential (primary) hypertension: Secondary | ICD-10-CM | POA: Diagnosis not present

## 2021-01-14 DIAGNOSIS — R2689 Other abnormalities of gait and mobility: Secondary | ICD-10-CM | POA: Diagnosis not present

## 2021-01-14 DIAGNOSIS — R41840 Attention and concentration deficit: Secondary | ICD-10-CM | POA: Diagnosis not present

## 2021-01-14 DIAGNOSIS — S065X3S Traumatic subdural hemorrhage with loss of consciousness of 1 hour to 5 hours 59 minutes, sequela: Secondary | ICD-10-CM | POA: Diagnosis not present

## 2021-01-14 DIAGNOSIS — I6601 Occlusion and stenosis of right middle cerebral artery: Secondary | ICD-10-CM | POA: Diagnosis not present

## 2021-01-14 DIAGNOSIS — M5459 Other low back pain: Secondary | ICD-10-CM | POA: Diagnosis not present

## 2021-01-14 DIAGNOSIS — M6389 Disorders of muscle in diseases classified elsewhere, multiple sites: Secondary | ICD-10-CM | POA: Diagnosis not present

## 2021-01-14 NOTE — Progress Notes (Signed)
Location:  [facility] Room Number: 159 Place of Service:  SNF [phone]) Provider:  Fletcher Anon NP   Etta Grandchild, MD  Patient Care Team: Etta Grandchild, MD as PCP - General (Internal Medicine) Laurey Morale, MD as PCP - Cardiology (Cardiology) Mia Creek, MD as Consulting Physician (Ophthalmology) Etta Grandchild, MD (Internal Medicine)  Extended Emergency Contact Information Primary Emergency Contact: Kae Heller Address: [address]          [place], [place] [address] Darden [place] of [place] Phone: [phone] Relation: Spouse Secondary Emergency Contact: Phineas Inches, [place] [address] Darden Amber of [place] Home Phone: [phone] Relation: Son  Code Status:  DNR Goals of care: Advanced Directive information Advanced Directives 01/11/2021  Does Patient Have a Medical Advance Directive? Yes  Type of Estate agent of [place];Living will;Out of facility DNR (pink MOST or yellow form)  Does patient want to make changes to medical advance directive? No - Patient declined  Copy of Healthcare Power of Attorney in Chart? Yes - validated most recent copy scanned in chart (See row information)  Would patient like information on creating a medical advance directive? -  Pre-existing out of facility DNR order (yellow form or pink MOST form) -     Chief Complaint  Patient presents with   Acute Visit   Lab abnormality     HPI:  Pt is a 80 y.o. male seen today for an acute visit for lab abnormality   PMH includes syncope with fall in May of 2022, TBI with SDH with craniotomy, acute right MCA s/p thrombectomy, mitral and aortic valve replacement, DM II, and afib.    Recently hospitalized 12/14/20-01/04/21 for acute metabolic encephalopathy due to hypernatremia and enterococcal bacteremia with prosthetic valve endocarditis. Blood cx grew enterococcus faecalis and urine culture grew staph aureus.  Repeat Blood cx 10/17 negative. He was treated with Rocephin and ampicillin with a stop date of 01/28/21 and then he will take amoxicillin 500 mg bid indefinitely. During hospital stay he was also found to have an acute left cerebellar vermis CVA felt to be due to septic emboli.  He was changed from Keppra to Vimpat for seizure prevention.  Labs from 11/09 with elevated WBC at 14.1, Neutrophil count at 11.5, CRP 8.26 and ESR 97  Recently seen 11/04 and 11/07 for follow up up SOB and weight gain. Chest Xray showed moderate patchy bilateral densities right greater than left.     Weight trends 11/4  188 lbs  11/5 180.2 after diuresed with Lasix 40 mg.  Recorded weight 11/9 trending up at 185 lbs. Lasix on hold due to hypernatremia 152, now NA 143 on 11/9  PICC line remains in RUE.   Nutritionally prior hospital admission he was losing weight and required a [place] feeding tube during his hospitalizatoin. Staff monitoring I& O's. Recorded intake yesterday 850 cc. He is also receiving 800 cc of IVF and total output recorded was 850 cc. There remains a concern for intake deficit.   7 BM's recorded yesterday. Stool were soft and solid patient received miralix and suppository. Recently started on low dose iron.    Patient continue to have weakness, has a condom cath in place and requires staff assistance with ADL's. Worked with PT today.      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    Other and unspecified  hyperlipidemia    Pre-diabetes    Previous back surgery 1978, jan 2007   Psychosexual dysfunction with inhibited sexual excitement    SOB (shortness of breath)    "with heavy exercise"   Stroke ([place]) 08/2013   "I WAS IN AFIB AND THREW A CLOT, THE EFFECTS WERE TRANSITORY AND DIDNT LAST BUT FOR 30 MINUTES"    Thoracic aortic aneurysm    Past Surgical History:  Procedure Laterality Date   AORTIC AND MITRAL VALVE REPLACEMENT     09/2004   CARDIOVERSION     3 times from 2004-2006   CARDIOVERSION N/A 09/26/2013   Procedure: CARDIOVERSION;  Surgeon: Larey Dresser, MD;  Location: [place];  Service: Cardiovascular;  Laterality: N/A;   CARDIOVERSION N/A 06/19/2014   Procedure: CARDIOVERSION;  Surgeon: Jerline Pain, MD;  Location: [place];  Service: Cardiovascular;  Laterality: N/A;   CARDIOVERSION N/A 04/24/2017   Procedure: CARDIOVERSION;  Surgeon: Larey Dresser, MD;  Location: [place];  Service: Cardiovascular;  Laterality: N/A;   CARDIOVERSION N/A 06/08/2017   Procedure: CARDIOVERSION;  Surgeon: Pixie Casino, MD;  Location: [facility] ENDOSCOPY;  Service: Cardiovascular;  Laterality: N/A;   CARDIOVERSION N/A 08/24/2017   Procedure: CARDIOVERSION;  Surgeon: Skeet Latch, MD;  Location: [facility] ENDOSCOPY;  Service: Cardiovascular;  Laterality: N/A;   CARDIOVERSION N/A 02/14/2018   Procedure: CARDIOVERSION;  Surgeon: Larey Dresser, MD;  Location: [facility] ENDOSCOPY;  Service: Cardiovascular;  Laterality: N/A;   COLONOSCOPY     CRANIOTOMY N/A 07/28/2020   Procedure: CRANIOTOMY FOR EVACUATION OF SUBDURAL HEMATOMA;  Surgeon: Eustace Alper, MD;  Location: [place];  Service: Neurosurgery;  Laterality: N/A;   IR ANGIO VERTEBRAL SEL SUBCLAVIAN INNOMINATE UNI R MOD SED  08/07/2020   IR CT HEAD LTD  08/07/2020   IR PERCUTANEOUS ART THROMBECTOMY/INFUSION INTRACRANIAL INC DIAG ANGIO  08/07/2020   laminectomies     10/1975 and in 03/2005   RADIOLOGY WITH ANESTHESIA N/A 08/07/2020   Procedure: IR WITH  ANESTHESIA;  Surgeon: Luanne Bras, MD;  Location: [place];  Service: Radiology;  Laterality: N/A;   TEE WITHOUT CARDIOVERSION N/A 12/18/2020   Procedure: TRANSESOPHAGEAL ECHOCARDIOGRAM (TEE);  Surgeon: Larey Dresser, MD;  Location: [facility] ENDOSCOPY;  Service: Cardiovascular;  Laterality: N/A;   [place] Right 04/03/2018   Procedure: TOTAL HIP ARTHROPLASTY ANTERIOR APPROACH;  Surgeon: Paralee Cancel, MD;  Location: [facility];  Service: Orthopedics;  Laterality: Right;  70 mins    Allergies  Allergen Reactions   Naproxen Hives   No [facility] Food Allergies Other (See Comments)    Scallops - distress, nausea and vomitting    Outpatient Encounter Medications as of 01/14/2021  Medication Sig   amantadine (SYMMETREL) 50 MG/5ML solution 8 AM and 2 PM   amLODipine (NORVASC) 10 MG tablet Take 1 tablet (10 mg total) by mouth daily.   ampicillin IVPB Inject 12 g into the  vein daily. As a continuous infusion. Indication:  Enterococcus endocarditis  First Dose: Yes Last Day of Therapy:  01/28/21 Labs - Once weekly:  CBC/D and BMP, Labs - Every other week:  ESR and CRP Method of administration: Ambulatory Pump (Continuous Infusion) Method of administration may be changed at the discretion of home infusion pharmacist based upon assessment of the patient and/or caregiver's ability to self-administer the medication ordered.   apixaban (ELIQUIS) 5 MG TABS tablet Take 1 tablet (5 mg total) by mouth 2 (two) times daily.   atorvastatin (LIPITOR) 80 MG tablet Take 1 tablet (80 mg total) by mouth daily.   bisacodyl (DULCOLAX) 10 MG suppository Place 1 suppository (10 mg total) rectally daily as needed for moderate constipation.   cefTRIAXone (ROCEPHIN) IVPB Inject 2 g into the vein every 12 (twelve) hours. Indication:  Enterococcal endocarditis  First Dose: Yes Last Day of Therapy:  01/28/21 Labs - Once weekly:  CBC/D and BMP, Labs - Every other week:  ESR and  CRP Method of administration: IV Push Method of administration may be changed at the discretion of home infusion pharmacist based upon assessment of the patient and/or caregiver's ability to self-administer the medication ordered.   clobetasol cream (TEMOVATE) 0.24 % Apply 1 application topically daily as needed (for skin irritation).    docusate sodium (COLACE) 100 MG capsule Take 1 capsule (100 mg total) by mouth 2 (two) times daily.   dofetilide (TIKOSYN) 125 MCG capsule Take 3 capsules (375 mcg total) by mouth 2 (two) times daily.   famotidine (PEPCID) 20 MG tablet Take 20 mg by mouth at bedtime as needed.   feeding supplement, GLUCERNA SHAKE, (GLUCERNA SHAKE) LIQD Take 237 mLs by mouth 2 (two) times daily between meals. For weight loss   ferrous sulfate 325 (65 FE) MG EC tablet Take 325 mg by mouth 3 (three) times a week.   finasteride (PROSCAR) 5 MG tablet Take 1 tablet (5 mg total) by mouth daily.   hydrocortisone cream 1 % Apply topically 2 (two) times daily as needed for itching (rash).   ipratropium-albuterol (DUONEB) 0.5-2.5 (3) MG/3ML SOLN Take 3 mLs by nebulization every 6 (six) hours as needed.   lacosamide (VIMPAT) 50 MG TABS tablet Take 1 tablet (50 mg total) by mouth 2 (two) times daily.   loratadine (CLARITIN) 10 MG tablet Take 10 mg by mouth at bedtime.   magnesium gluconate (MAGONATE) 500 MG tablet Take 1 tablet (500 mg total) by mouth daily.   Multiple Vitamin (MULTIVITAMIN WITH MINERALS) TABS tablet Take 1 tablet by mouth daily.   polyethylene glycol (MIRALAX / GLYCOLAX) 17 g packet Take 17 g by mouth daily as needed.   polyvinyl alcohol (LIQUIFILM TEARS) 1.4 % ophthalmic solution Place 1 drop into both eyes 2 (two) times daily.   tamsulosin (FLOMAX) 0.4 MG CAPS capsule Take 0.4 mg by mouth daily.   No facility-administered encounter medications on file as of 01/14/2021.    Review of Systems  Unable to perform ROS: Acuity of condition   Immunization History   Administered Date(s) Administered   Fluad Quad(high Dose 65+) 12/06/2018, 12/09/2020   Influenza Whole 12/25/2008, 01/05/2010, 12/30/2011   Influenza, High Dose Seasonal PF 12/14/2016   Influenza,inj,Quad PF,6+ Mos 12/19/2013   Influenza,inj,quad, With Preservative 11/22/2017   Influenza-Unspecified 12/14/2015, 12/24/2019   Moderna SARS-COV2 Booster Vaccination 01/02/2020, 06/26/2020   Moderna Sars-Covid-2 Vaccination 04/08/2019, 05/05/2019   Pneumococcal Conjugate-13 12/19/2013   Pneumococcal Polysaccharide-23 09/24/2007, 05/22/2017   Td 12/25/2008   Tdap 03/22/2018  Zoster Recombinat (Shingrix) 07/16/2019, 10/28/2019   Zoster, Live 09/24/2007   Pertinent  Health Maintenance Due  Topic Date Due   FOOT EXAM  Never done   URINE MICROALBUMIN  Never done   OPHTHALMOLOGY EXAM  04/07/2021 (Originally 10/04/1950)   HEMOGLOBIN A1C  06/17/2021   INFLUENZA VACCINE  Completed   Fall Risk 01/03/2021 01/03/2021 01/04/2021 01/04/2021 01/05/2021  Falls in the past year? - - - - -  Was there an injury with Fall? - - - - -  Fall Risk Category Calculator - - - - -  Fall Risk Category - - - - -  Patient Fall Risk Level High fall risk High fall risk High fall risk High fall risk High fall risk  Patient at Risk for Falls Due to - - - - -  Patient at Risk for Falls Due to - - - - -  Fall risk Follow up - - - - -   Functional Status Survey:    Vitals:   01/14/21 1008  BP: 131/77  Pulse: 91  Resp: 20  Temp: 99 F (37.2 C)  SpO2: 96%   There is no height or weight on file to calculate BMI. Physical Exam Vitals and nursing note reviewed.  Constitutional:      General: He is not in acute distress.    Appearance: He is not diaphoretic.     Comments: Asleep but easily arouses.  HENT:     Head: Normocephalic and atraumatic.     Nose: Nose normal.     Mouth/Throat:     Mouth: Mucous membranes are dry.     Pharynx: Oropharynx is clear.  Eyes:     Conjunctiva/sclera: Conjunctivae normal.      Pupils: Pupils are equal, round, and reactive to light.  Neck:     Thyroid: No thyromegaly.     Vascular: No JVD.     Trachea: No tracheal deviation.  Cardiovascular:     Rate and Rhythm: Normal rate and regular rhythm.     Heart sounds: No murmur heard. Pulmonary:     Effort: No respiratory distress.     Breath sounds: Normal breath sounds. No wheezing.     Comments: Slight increase in wob Abdominal:     General: Bowel sounds are normal. There is no distension.     Palpations: Abdomen is soft.     Tenderness: There is no abdominal tenderness.  Musculoskeletal:     Comments: Edema improved to arms. Present to BLE +1  Lymphadenopathy:     Cervical: No cervical adenopathy.  Skin:    General: Skin is warm and dry.  Neurological:     Comments: Left sided weakness and facial droop. Alert and oriented to self. Not able to f/c consistently  Psychiatric:        Mood and Affect: Mood normal.    Labs reviewed: Recent Labs    12/18/20 1630 12/19/20 0610 12/19/20 1632 12/20/20 0153 01/01/21 1817 01/02/21 0240 01/03/21 0221 01/08/21 0000 01/11/21 0000  NA  --  139  --    < > 138 137 138 141 152*  K  --  3.5  --    < > 3.8 4.3 3.7 3.6 3.2*  CL  --  108  --    < > 109 106 109 107 114*  CO2  --  23  --    < > 22 23 23  24* 20  GLUCOSE  --  186*  --    < >  135* 122* 100*  --   --   BUN  --  16  --    < > $R'13 15 17 15 12  'bI$ CREATININE  --  0.76  --    < > 0.84 0.88 0.93 0.6 0.6  CALCIUM  --  7.7*  --    < > 8.2* 8.4* 8.3* 8.4* 7.2*  MG 1.9 2.0 1.9   < > 2.1 2.0 2.0  --   --   PHOS 3.1 2.7 2.4*  --   --   --   --   --   --    < > = values in this interval not displayed.   Recent Labs    01/01/21 0042 01/02/21 0240 01/03/21 0221 01/08/21 0000  AST 37 52* 32 26  ALT 68* 77* 63* 42*  ALKPHOS 88 105 94 120  BILITOT 0.5 0.7 0.4  --   PROT 6.3* 6.4* 6.3*  --   ALBUMIN 1.9* 2.0* 1.9* 2.8*   Recent Labs    08/16/20 0427 08/17/20 0340 12/14/20 1245 12/14/20 1504  12/16/20 0130 12/18/20 0221 12/31/20 0101 01/01/21 0042 01/02/21 0240 01/08/21 0000 01/11/21 0000  WBC 7.9   < > 12.0*   < > 10.7*   < > 13.0* 10.5 10.3 10.9 10.1  NEUTROABS 5.7  --  9.3*  --  7.7  --   --   --   --   --   --   HGB 9.0*   < > 11.7*   < > 9.9*   < > 8.2* 7.6* 7.9* 8.0* 7.7*  HCT 28.2*   < > 36.8*   < > 30.7*   < > 25.7* 24.4* 25.0* 24* 23*  MCV 98.6   < > 90.0   < > 88.0   < > 89.9 91.4 92.6  --   --   PLT 177   < > 119*   < > 106*   < > 268 252 254  --  335   < > = values in this interval not displayed.   Lab Results  Component Value Date   TSH 1.567 12/14/2020   Lab Results  Component Value Date   HGBA1C 7.3 (H) 12/17/2020   Lab Results  Component Value Date   CHOL 119 08/08/2020   HDL 42 08/08/2020   LDLCALC 56 08/08/2020   TRIG 105 08/08/2020   CHOLHDL 2.8 08/08/2020    Significant Diagnostic Results in last 30 days:  DG Wrist 2 Views Right  Result Date: 12/22/2020 CLINICAL DATA:  Tenderness with movement. EXAM: RIGHT WRIST - 2 VIEW COMPARISON:  None. FINDINGS: Radiocarpal joint appears within normal limits. There are degenerative changes the articulation between the scaphoid and the multangular bones and at the first and second carpometacarpal articulations. No sign of fracture. Regional arterial calcification incidentally noted. IMPRESSION: Radial side degenerative changes as described above which could certainly be painful. Electronically Signed   By: Nelson Chimes M.D.   On: 12/22/2020 13:51   MR BRAIN WO CONTRAST  Result Date: 12/18/2020 CLINICAL DATA:  Mental status change, unknown cause EXAM: MRI HEAD WITHOUT CONTRAST TECHNIQUE: Multiplanar, multiecho pulse sequences of the brain and surrounding structures were obtained without intravenous contrast. COMPARISON:  08/29/2020 FINDINGS: Motion artifact is present. Brain: Small focus of reduced diffusion is present within the left cerebellar vermis. Diffusion hyperintensity in the region of prior right  MCA infarct is probably artifactual. There is a 1.2 cm subacute hematoma of the anterior right  superior frontal gyrus (series 6, image 18). Chronic right MCA territory infarction with chronic blood products. Ex vacuo dilatation of the adjacent right lateral ventricle. Chronic infarct on the left near junction of parietal, temporal, and occipital lobes also with chronic blood products. Additional confluent T2 hyperintensity in the supratentorial white matter is nonspecific but may reflect chronic microvascular ischemic changes. Disproportionate prominence of the lateral and third ventricles is unchanged. Thin extra-axial collection underlies craniotomy. Vascular: Major vessel flow voids at the skull base are preserved. Skull and upper cervical spine: Normal marrow signal is preserved. Prior right craniotomy. Sinuses/Orbits: Minor mucosal thickening. No acute orbital abnormality. Other: Sella is unremarkable.  Mastoid air cells are clear. IMPRESSION: Small acute infarct left cerebellar vermis. Small subacute hematoma anterior right frontal lobe (isodense on CT). Stable chronic findings of infarcts, volume loss, and chronic microvascular ischemic changes. Electronically Signed   By: Macy Mis M.D.   On: 12/18/2020 12:49   DG Chest Port 1 View  Result Date: 12/31/2020 CLINICAL DATA:  Shortness of breath EXAM: PORTABLE CHEST 1 VIEW COMPARISON:  Chest x-ray dated December 30, 2020 FINDINGS: Cardiac and mediastinal contours are unchanged post median sternotomy. Slightly increased bilateral central opacities. No large pleural effusion or evidence of pneumothorax. Enteric tube partially visualized coursing below the diaphragm. IMPRESSION: Slightly increased bilateral interstitial opacities, likely due to worsening pulmonary edema. Electronically Signed   By: Yetta Glassman M.D.   On: 12/31/2020 08:11   DG Chest Port 1 View  Result Date: 12/30/2020 CLINICAL DATA:  Stroke, AFib, sepsis EXAM: PORTABLE CHEST 1  VIEW COMPARISON:  Radiograph 12/29/2020 FINDINGS: Feeding tube passes below diaphragm, tip excluded by collimation. Prior median sternotomy and valve replacement. Unchanged cardiomegaly. Diffuse interstitial opacities, similar to prior exam. No large pleural effusion or visible pneumothorax. Bones are unchanged. IMPRESSION: Unchanged cardiomegaly. Diffuse interstitial opacities consistent with pulmonary edema. Electronically Signed   By: Maurine Simmering M.D.   On: 12/30/2020 08:45   DG Chest Port 1 View  Result Date: 12/29/2020 CLINICAL DATA:  Shortness of breath EXAM: PORTABLE CHEST 1 VIEW COMPARISON:  12/28/2020 FINDINGS: Cardiomegaly status post median sternotomy with aortic and mitral valvular prosthesis. Mild, diffuse bilateral interstitial pulmonary opacity, similar compared to prior examination. No new airspace opacity. Partially imaged enteric feeding tube. IMPRESSION: Cardiomegaly. Mild, diffuse bilateral interstitial pulmonary opacity, similar compared to prior examination, consistent with edema or infection. No new airspace opacity. Electronically Signed   By: Delanna Ahmadi M.D.   On: 12/29/2020 11:40   DG Chest Port 1 View  Result Date: 12/28/2020 CLINICAL DATA:  Shortness of breath, CHF EXAM: PORTABLE CHEST 1 VIEW COMPARISON:  Portable exam 0000 hours compared to 12/14/2020 FINDINGS: Feeding tube traverses esophagus into stomach. Enlargement of cardiac silhouette post median sternotomy, MVR, and AVR. Atherosclerotic calcification aorta. Minimal bibasilar atelectasis. Lungs otherwise clear. No infiltrate, pleural effusion, or pneumothorax. No acute osseous findings. IMPRESSION: Minimal bibasilar atelectasis. Enlargement of cardiac silhouette post MVR and AVR. Electronically Signed   By: Lavonia Dana M.D.   On: 12/28/2020 08:36   DG Abd Portable 1V  Result Date: 12/31/2020 CLINICAL DATA:  Ileus. EXAM: PORTABLE ABDOMEN - 1 VIEW COMPARISON:  December 30, 2020. FINDINGS: Distal tip of feeding tube  is seen in expected position of distal stomach. No significant bowel dilatation is seen at this time. Phleboliths are noted in the pelvis. IMPRESSION: No significant abnormal bowel dilatation is seen at this time. Electronically Signed   By: Marijo Conception M.D.   On:  12/31/2020 08:13   DG Abd Portable 1V  Result Date: 12/30/2020 CLINICAL DATA:  Nausea and vomiting. EXAM: PORTABLE ABDOMEN - 1 VIEW COMPARISON:  Same-day abdominal radiograph at 0550 hours FINDINGS: Enteric tube tip overlies expected location of the gastric antrum. Multiple gas-filled dilated loops of colon, similar to earlier radiograph. Mild-to-moderate stool burden within the ascending colon. No definite dilated small bowel loops. No free intraperitoneal air given the limits of the supine image. Partially visualized right hip arthroplasty. IMPRESSION: Stable position of the enteric tube, overlying the expected location of the gastric antrum. Unchanged exam compared to earlier same day radiograph with persistent diffuse gaseous dilatation of the colon. Electronically Signed   By: Sherron Ales M.D.   On: 12/30/2020 15:34   DG Abd Portable 1V  Result Date: 12/30/2020 CLINICAL DATA:  Nausea EXAM: PORTABLE ABDOMEN - 1 VIEW COMPARISON:  04/20/2020 FINDINGS: Feeding tube tip overlies the distal stomach. There is diffuse gaseous distension of the colon and moderate stool burden in the ascending colon and rectum. No clear small bowel dilation. Prior right total hip arthroplasty. Lumbar spine degenerative changes. No acute osseous abnormality. IMPRESSION: Diffuse gaseous distension of the colon with moderate stool burden in the ascending colon and rectum. Feeding tube tip overlies the distal stomach. Electronically Signed   By: Caprice Renshaw M.D.   On: 12/30/2020 08:42   DG Abd Portable 1V  Result Date: 12/18/2020 CLINICAL DATA:  Feeding tube placement EXAM: PORTABLE ABDOMEN - 1 VIEW COMPARISON:  09/01/2020 FINDINGS: Feeding tube terminates at  the distal stomach. Cardiomegaly with median sternotomy and left base airspace disease. Non-obstructive bowel gas pattern. No gross free intraperitoneal air. IMPRESSION: Feeding tube terminating at the distal stomach. Electronically Signed   By: Jeronimo Greaves M.D.   On: 12/18/2020 16:44   DG Swallowing Func-Speech Pathology  Result Date: 01/01/2021 Table formatting from the original result was not included. Objective Swallowing Evaluation: Type of Study: MBS-Modified Barium Swallow Study  Patient Details Name: GRIFFYN KUCINSKI MRN: [id number] Date of Birth: 15-Oct-1940 Today's Date: 01/01/2021 Time: SLP Start Time (ACUTE ONLY): 0827 -SLP Stop Time (ACUTE ONLY): 0841 SLP Time Calculation (min) (ACUTE ONLY): 14 min Past Medical History: Past Medical History: Diagnosis Date  Acute rheumatic heart disease, unspecified   childhood, age  76 & 10  Acute rheumatic pericarditis   Atrial fibrillation (HCC)   history  CHF (congestive heart failure) (HCC)   Diverticulosis   Dysrhythmia   Enlarged aorta ([facility]) 2019  External hemorrhoids without mention of complication   H/O aortic valve replacement   H/O mitral valve replacement   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  Lesion of ulnar nerve   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   Other and unspecified hyperlipidemia   Pre-diabetes   Previous back surgery 1978, jan 2007  Psychosexual dysfunction with inhibited sexual excitement   SOB (shortness of breath)   "with heavy exercise"  Stroke ([facility]) 08/2013  "I WAS IN AFIB AND THREW A CLOT, THE EFFECTS WERE TRANSITORY AND DIDNT LAST BUT FOR 30 MINUTES"   Thoracic aortic aneurysm  Past Surgical History: Past Surgical History: Procedure Laterality Date  AORTIC AND MITRAL VALVE REPLACEMENT    09/2004  CARDIOVERSION    3 times from 2004-2006  CARDIOVERSION N/A 09/26/2013  Procedure: CARDIOVERSION;  Surgeon: Laurey Morale, MD;  Location: [facility] ENDOSCOPY;  Service: Cardiovascular;  Laterality: N/A;  CARDIOVERSION N/A 06/19/2014   Procedure: CARDIOVERSION;  Surgeon: Jake Bathe, MD;  Location: [facility] ENDOSCOPY;  Service: Cardiovascular;  Laterality: N/A;  CARDIOVERSION N/A 04/24/2017  Procedure: CARDIOVERSION;  Surgeon: Laurey Morale, MD;  Location: [facility] ENDOSCOPY;  Service: Cardiovascular;  Laterality: N/A;  CARDIOVERSION N/A 06/08/2017  Procedure: CARDIOVERSION;  Surgeon: Chrystie Nose, MD;  Location: [facility] ENDOSCOPY;  Service: Cardiovascular;  Laterality: N/A;  CARDIOVERSION N/A 08/24/2017  Procedure: CARDIOVERSION;  Surgeon: Chilton Si, MD;  Location: [facility] ENDOSCOPY;  Service: Cardiovascular;  Laterality: N/A;  CARDIOVERSION N/A 02/14/2018  Procedure: CARDIOVERSION;  Surgeon: Laurey Morale, MD;  Location: [facility] ENDOSCOPY;  Service: Cardiovascular;  Laterality: N/A;  COLONOSCOPY    CRANIOTOMY N/A 07/28/2020  Procedure: CRANIOTOMY FOR EVACUATION OF SUBDURAL HEMATOMA;  Surgeon: Tia Alert, MD;  Location: [facility] OR;  Service: Neurosurgery;  Laterality: N/A;  IR ANGIO VERTEBRAL SEL SUBCLAVIAN INNOMINATE UNI R MOD SED  08/07/2020  IR CT HEAD LTD  08/07/2020  IR PERCUTANEOUS ART THROMBECTOMY/INFUSION INTRACRANIAL INC DIAG ANGIO  08/07/2020  laminectomies    10/1975 and in 03/2005  RADIOLOGY WITH ANESTHESIA N/A 08/07/2020  Procedure: IR WITH ANESTHESIA;  Surgeon: Julieanne Cotton, MD;  Location: [facility] OR;  Service: Radiology;  Laterality: N/A;  TEE WITHOUT CARDIOVERSION N/A 12/18/2020  Procedure: TRANSESOPHAGEAL ECHOCARDIOGRAM (TEE);  Surgeon: Laurey Morale, MD;  Location: [facility] ENDOSCOPY;  Service: Cardiovascular;  Laterality: N/A;  TONSILLECTOMY    1950  TOTAL HIP ARTHROPLASTY Right 04/03/2018  Procedure: TOTAL HIP ARTHROPLASTY ANTERIOR APPROACH;  Surgeon: Durene Romans, MD;  Location: [facility] ORS;  Service: Orthopedics;  Laterality: Right;  70 mins HPI: Pt is a 80 y.o. male who was admitted 10/10 for confusion/decreased mentation, and poor intake, Pt found to have enterococcus faecalis bacteremia, encephalopathy, small left cerebellar vermis CVA, constipation  with mild illeus which resolved. 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 Pt has a hx of cognitive deficits and dysphagia, with most recent MBS on 09/02/20 and most recent diet recommendations for Dysphagia 1 solids and honey-thick liquids.  Per family, pt has since advanced to mechanical soft solid and NTL.  Cortrak placed 12/18/20.  Subjective: Pt was lethargic Assessment / Plan / Recommendation CHL IP CLINICAL IMPRESSIONS 01/01/2021 Clinical Impression Pt presents with oropharyngeal dysphagia characterized by reduced bolus cohesion, reduced pharyngeal constriction, reduced anterior laryngeal movement and a pharyngeal delay. Brief oral holding was noted, but with adequate bolus containment. Premature spillage and the pharyngeal delay facilitated inconsistent penetration (PAS 5) with larger boluses of thin liquids via straw; however, no other instances of penetration/aspiration were noted during the study. Prompted coughing was ineffective in expelling the penetrate. Trace residue was noted in the pyriform sinuses and on the posterior pharyngeal wall, but this cleared with a liquid wash and secondary swallows. The barium tablet (taken whole with thin liquids) became lodged in the vallecule and subsequently in mid thoracic esophagus. However, transport to the esophagus was facilitated with a puree bolus, and esophageal clearance was achieved with a liquid wash. A dysphagia 3 diet with thin liquids is recommended at this time observance of swallowing precautions. SLP will continue to follow pt. SLP Visit Diagnosis Dysphagia, oropharyngeal phase (R13.12) Attention and concentration deficit following -- Frontal lobe and executive function deficit following -- Impact on safety and  function Mild aspiration risk   CHL IP TREATMENT RECOMMENDATION 01/01/2021 Treatment Recommendations Therapy as outlined in treatment plan below  Prognosis 01/01/2021 Prognosis for Safe Diet Advancement Fair Barriers to Reach Goals Cognitive deficits Barriers/Prognosis Comment -- CHL IP DIET RECOMMENDATION 01/01/2021 SLP Diet Recommendations Dysphagia 3 (Mech soft) solids;Thin liquid Liquid Administration via Cup;No straw Medication Administration Crushed with puree Compensations Slow rate;Small sips/bites Postural Changes Seated upright at 90 degrees   CHL IP OTHER RECOMMENDATIONS 01/01/2021 Recommended Consults -- Oral Care Recommendations Oral care BID Other Recommendations --   CHL IP FOLLOW UP RECOMMENDATIONS 01/01/2021 Follow up Recommendations 24 hour supervision/assistance;Skilled Nursing facility   [facility] IP FREQUENCY AND DURATION 01/01/2021 Speech Therapy Frequency (ACUTE ONLY) min 2x/week Treatment Duration 2 weeks      CHL IP ORAL PHASE 01/01/2021 Oral Phase Impaired Oral - Pudding Teaspoon -- Oral - Pudding Cup -- Oral - Honey Teaspoon -- Oral - Honey Cup -- Oral - Nectar Teaspoon -- Oral - Nectar Cup Holding of bolus Oral - Nectar Straw Holding of bolus Oral - Thin Teaspoon -- Oral - Thin Cup Holding of bolus Oral - Thin Straw Holding of bolus;Decreased bolus cohesion;Premature spillage Oral - Puree Holding of bolus Oral - Mech Soft -- Oral - Regular WFL Oral - Multi-Consistency -- Oral - Pill Holding of bolus Oral Phase - Comment --  CHL IP PHARYNGEAL PHASE 01/01/2021 Pharyngeal Phase Impaired Pharyngeal- Pudding Teaspoon -- Pharyngeal -- Pharyngeal- Pudding Cup -- Pharyngeal -- Pharyngeal- Honey Teaspoon -- Pharyngeal -- Pharyngeal- Honey Cup -- Pharyngeal -- Pharyngeal- Nectar Teaspoon -- Pharyngeal -- Pharyngeal- Nectar Cup Pharyngeal residue - pyriform;Reduced anterior laryngeal mobility Pharyngeal -- Pharyngeal- Nectar Straw Pharyngeal residue - pyriform;Reduced anterior laryngeal mobility  Pharyngeal -- Pharyngeal- Thin Teaspoon -- Pharyngeal -- Pharyngeal- Thin Cup Pharyngeal residue - pyriform;Reduced anterior laryngeal mobility Pharyngeal -- Pharyngeal- Thin Straw Pharyngeal residue - pyriform;Reduced anterior laryngeal mobility;Penetration/Aspiration during swallow Pharyngeal Material enters airway, CONTACTS cords and not ejected out Pharyngeal- Puree Pharyngeal residue - pyriform;Reduced anterior laryngeal mobility Pharyngeal -- Pharyngeal- Mechanical Soft -- Pharyngeal -- Pharyngeal- Regular Pharyngeal residue - pyriform;Reduced anterior laryngeal mobility Pharyngeal -- Pharyngeal- Multi-consistency -- Pharyngeal -- Pharyngeal- Pill -- Pharyngeal -- Pharyngeal Comment --  CHL IP CERVICAL ESOPHAGEAL PHASE 01/01/2021 Cervical Esophageal Phase WFL Pudding Teaspoon -- Pudding Cup -- Honey Teaspoon -- Honey Cup -- Nectar Teaspoon -- Nectar Cup -- Nectar Straw -- Thin Teaspoon -- Thin Cup -- Thin Straw -- Puree -- Mechanical Soft -- Regular -- Multi-consistency -- Pill -- Cervical Esophageal Comment -- Shanika I. Hardin Negus, [place], [place] Office number [phone] Pager [place] 01/01/2021, 9:36 AM              EEG adult  Result Date: 12/17/2020 Lora Havens, MD     12/17/2020  1:00 PM Patient Name: SALOME COZBY MRN: [id number] Epilepsy Attending: Lora Havens Referring Physician/Provider: Dr Oren Binet Date: 12/17/2020 Duration: 24.04 mins Patient history: 80 year old male with recent subdural hematoma in May, seizures who presented with altered mental status.  EEG to evaluate for seizures. Level of alertness: Awake AEDs during EEG study: LEV Technical aspects: This EEG study was done with scalp electrodes positioned according to the 10-20 International system of electrode placement. Electrical activity was acquired at a sampling rate of $Remov'500Hz'elzCeK$  and reviewed with a high frequency filter of $RemoveB'70Hz'pikHrTCb$  and a low frequency filter of $RemoveB'1Hz'ITDSMRCc$ . EEG  data were recorded continuously and digitally stored. Description: The posterior dominant rhythm consists of 8-9 Hz activity of moderate voltage (25-35 uV) seen predominantly in posterior head regions, symmetric and reactive to eye opening and eye closing.  EEG showed continuous low amplitude 2 to 3 Hz delta slowing in right frontotemporal region. Intermittent generalized 3 to 5 Hz theta-delta slowing was also noted. Hyperventilation and photic stimulation were not performed.   ABNORMALITY - Continuous slow, right frontotemporal region - Intermittent slow, generalized IMPRESSION: This study is suggestive of cortical dysfunction in right frontotemporal region likely secondary to underlying structural abnormality/stroke/subdural hemorrhage.  Additionally there is mild diffuse encephalopathy, nonspecific etiology.  No seizures or epileptiform discharges were seen throughout the recording.  If suspicion for ictal- interictal activity remains a concern, a prolonged study can be considered. Priyanka Barbra Sarks   Overnight EEG with video  Result Date: 12/18/2020 Lora Havens, MD     12/19/2020  7:36 AM Patient Name: ROMON DEVEREUX MRN: [id number] Epilepsy Attending: Lora Havens Referring Physician/Provider: Dr Oren Binet Duration: 12/17/2020 1638 to 12/18/2020 1002  Patient history: 80 year old male with recent subdural hematoma in May, seizures who presented with altered mental status.  EEG to evaluate for seizures.  Level of alertness: Awake, asleep  AEDs during EEG study: LEV  Technical aspects: This EEG study was done with scalp electrodes positioned according to the 10-20 International system of electrode placement. Electrical activity was acquired at a sampling rate of $Remov'500Hz'GeBIrA$  and reviewed with a high frequency filter of $RemoveB'70Hz'XucWoJkF$  and a low frequency filter of $RemoveB'1Hz'EYeXQEEA$ . EEG data were recorded continuously and digitally stored.  Description: The posterior dominant rhythm consists of 8-9 Hz activity of moderate  voltage (25-35 uV) seen predominantly in posterior head regions, symmetric and reactive to eye opening and eye closing.  Sleep was characterized by vertex waves, sleep spindles (12 to 14 Hz), maximal frontocentral region.  EEG showed continuous low amplitude 2 to 3 Hz delta slowing in right frontotemporal region. Hyperventilation and photic stimulation were not performed.    ABNORMALITY - Continuous slow, right frontotemporal region  IMPRESSION: This study is suggestive of cortical dysfunction in right frontotemporal region likely secondary to underlying structural abnormality/stroke/subdural hemorrhage. No seizures or epileptiform discharges were seen throughout the recording. Priyanka O Yadav   [place] EKG SITE RITE  Result Date: 01/01/2021 If [facility] image not attached, placement could not be confirmed due to current cardiac rhythm.   Assessment/Plan 1. Poor fluid intake Encourage oral intake, continue to monitor BMP Staff orders to continue monitoring I&O  Avoid diuresis unless absolutely necessary due to hx of hypernatremia  2. Low grade fever Fever today 99.0 with elevated ESR and CRP and WBC Stat peripheral blood cultures ordered x2, discussed with ID Dr. Linus Salmons.   3. Prosthetic valve endocarditis, subsequent encounter Continues Rocephin and Ampicillin IV until 11/24 Lab results from 11/09 showed elevated WBC 14.1, CRP 8.26 and ESR 97. ID contacted and results faxed.  Appointment scheduled for Monday with Scharlene Gloss   4. SOB (shortness of breath) Improved but still present  Stat CXR ordered  5. 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 Seen by therapy today   I discussed with his wife that he is not taking in adequate oral intake and his labs are trending in the wrong decision. We discussed if he worsened the choice between sending him to the hospital or going with comfort care. They are contemplating.    Family/ staff Communication: Communication with Spouse and Son   Labs/tests ordered:  Stat Blood cultures x2

## 2021-01-14 NOTE — Telephone Encounter (Signed)
Labs: 11/9: WBC 14.1, SCr 0.78, ESR 97, CRP 8 11/7 WBC 10.1, SCr 0.56

## 2021-01-14 NOTE — Telephone Encounter (Signed)
Patient to see Dr. Linus Salmons 11/14. Sherry at [facility] to fax labs to pharmacy. They wanted to let Dr. Linus Salmons know that their NP ordered stat blood cultures for the patient.   Beryle Flock, RN

## 2021-01-15 ENCOUNTER — Encounter: Payer: Self-pay | Admitting: Adult Health

## 2021-01-15 DIAGNOSIS — I69391 Dysphagia following cerebral infarction: Secondary | ICD-10-CM | POA: Diagnosis not present

## 2021-01-15 DIAGNOSIS — R0602 Shortness of breath: Secondary | ICD-10-CM | POA: Diagnosis not present

## 2021-01-15 DIAGNOSIS — R2689 Other abnormalities of gait and mobility: Secondary | ICD-10-CM | POA: Diagnosis not present

## 2021-01-15 DIAGNOSIS — R278 Other lack of coordination: Secondary | ICD-10-CM | POA: Diagnosis not present

## 2021-01-15 DIAGNOSIS — R41841 Cognitive communication deficit: Secondary | ICD-10-CM | POA: Diagnosis not present

## 2021-01-15 DIAGNOSIS — I10 Essential (primary) hypertension: Secondary | ICD-10-CM | POA: Diagnosis not present

## 2021-01-15 DIAGNOSIS — H538 Other visual disturbances: Secondary | ICD-10-CM | POA: Diagnosis not present

## 2021-01-15 DIAGNOSIS — R41840 Attention and concentration deficit: Secondary | ICD-10-CM | POA: Diagnosis not present

## 2021-01-15 DIAGNOSIS — I5032 Chronic diastolic (congestive) heart failure: Secondary | ICD-10-CM | POA: Diagnosis not present

## 2021-01-15 DIAGNOSIS — S065X3S Traumatic subdural hemorrhage with loss of consciousness of 1 hour to 5 hours 59 minutes, sequela: Secondary | ICD-10-CM | POA: Diagnosis not present

## 2021-01-15 DIAGNOSIS — M6389 Disorders of muscle in diseases classified elsewhere, multiple sites: Secondary | ICD-10-CM | POA: Diagnosis not present

## 2021-01-15 DIAGNOSIS — I6601 Occlusion and stenosis of right middle cerebral artery: Secondary | ICD-10-CM | POA: Diagnosis not present

## 2021-01-15 DIAGNOSIS — R601 Generalized edema: Secondary | ICD-10-CM | POA: Diagnosis not present

## 2021-01-15 DIAGNOSIS — M5459 Other low back pain: Secondary | ICD-10-CM | POA: Diagnosis not present

## 2021-01-18 ENCOUNTER — Ambulatory Visit (INDEPENDENT_AMBULATORY_CARE_PROVIDER_SITE_OTHER): Payer: Medicare Other | Admitting: Internal Medicine

## 2021-01-18 ENCOUNTER — Other Ambulatory Visit: Payer: Self-pay

## 2021-01-18 ENCOUNTER — Other Ambulatory Visit (HOSPITAL_BASED_OUTPATIENT_CLINIC_OR_DEPARTMENT_OTHER): Payer: Self-pay

## 2021-01-18 ENCOUNTER — Encounter: Payer: Self-pay | Admitting: Internal Medicine

## 2021-01-18 VITALS — BP 107/75 | HR 107 | Temp 97.8°F

## 2021-01-18 DIAGNOSIS — H538 Other visual disturbances: Secondary | ICD-10-CM | POA: Diagnosis not present

## 2021-01-18 DIAGNOSIS — S065X3S Traumatic subdural hemorrhage with loss of consciousness of 1 hour to 5 hours 59 minutes, sequela: Secondary | ICD-10-CM | POA: Diagnosis not present

## 2021-01-18 DIAGNOSIS — I69391 Dysphagia following cerebral infarction: Secondary | ICD-10-CM | POA: Diagnosis not present

## 2021-01-18 DIAGNOSIS — R2689 Other abnormalities of gait and mobility: Secondary | ICD-10-CM | POA: Diagnosis not present

## 2021-01-18 DIAGNOSIS — T826XXD Infection and inflammatory reaction due to cardiac valve prosthesis, subsequent encounter: Secondary | ICD-10-CM | POA: Diagnosis not present

## 2021-01-18 DIAGNOSIS — R278 Other lack of coordination: Secondary | ICD-10-CM | POA: Diagnosis not present

## 2021-01-18 DIAGNOSIS — G9341 Metabolic encephalopathy: Secondary | ICD-10-CM | POA: Diagnosis not present

## 2021-01-18 DIAGNOSIS — I63511 Cerebral infarction due to unspecified occlusion or stenosis of right middle cerebral artery: Secondary | ICD-10-CM

## 2021-01-18 DIAGNOSIS — I5032 Chronic diastolic (congestive) heart failure: Secondary | ICD-10-CM | POA: Diagnosis not present

## 2021-01-18 DIAGNOSIS — A4189 Other specified sepsis: Secondary | ICD-10-CM | POA: Diagnosis not present

## 2021-01-18 DIAGNOSIS — R601 Generalized edema: Secondary | ICD-10-CM | POA: Diagnosis not present

## 2021-01-18 DIAGNOSIS — I6601 Occlusion and stenosis of right middle cerebral artery: Secondary | ICD-10-CM | POA: Diagnosis not present

## 2021-01-18 DIAGNOSIS — I38 Endocarditis, valve unspecified: Secondary | ICD-10-CM | POA: Diagnosis not present

## 2021-01-18 DIAGNOSIS — R41840 Attention and concentration deficit: Secondary | ICD-10-CM | POA: Diagnosis not present

## 2021-01-18 DIAGNOSIS — R251 Tremor, unspecified: Secondary | ICD-10-CM | POA: Diagnosis not present

## 2021-01-18 DIAGNOSIS — I10 Essential (primary) hypertension: Secondary | ICD-10-CM | POA: Diagnosis not present

## 2021-01-18 DIAGNOSIS — R41841 Cognitive communication deficit: Secondary | ICD-10-CM | POA: Diagnosis not present

## 2021-01-18 DIAGNOSIS — M5459 Other low back pain: Secondary | ICD-10-CM | POA: Diagnosis not present

## 2021-01-18 DIAGNOSIS — M6389 Disorders of muscle in diseases classified elsewhere, multiple sites: Secondary | ICD-10-CM | POA: Diagnosis not present

## 2021-01-18 LAB — CBC AND DIFFERENTIAL
HCT: 27 — AB (ref 41–53)
HCT: 27 — AB (ref 41–53)
Hemoglobin: 8.7 — AB (ref 13.5–17.5)
Hemoglobin: 8.7 — AB (ref 13.5–17.5)
Platelets: 415 — AB (ref 150–399)
Platelets: 415 — AB (ref 150–399)
WBC: 10.6
WBC: 10.6

## 2021-01-18 LAB — BASIC METABOLIC PANEL
BUN: 11 (ref 4–21)
CO2: 23 — AB (ref 13–22)
Chloride: 108 (ref 99–108)
Creatinine: 0.7 (ref 0.6–1.3)
Glucose: 105
Potassium: 3.5 (ref 3.4–5.3)
Sodium: 142 (ref 137–147)

## 2021-01-18 LAB — CBC: RBC: 3.07 — AB (ref 3.87–5.11)

## 2021-01-18 LAB — COMPREHENSIVE METABOLIC PANEL: Calcium: 8.8 (ref 8.7–10.7)

## 2021-01-18 NOTE — Assessment & Plan Note (Signed)
No new concerns and he continues on treatment with appropriate dual beta lactam therapy and will complete the 6 weeks on 01/28/21.  After that, he will transition to oral amoxicillin 500 mg twice a day indefinitely due to the prosthetic valve.  I will have him rtc in about 6 months, sooner if needed.

## 2021-01-18 NOTE — Progress Notes (Signed)
   Subjective:    Patient ID: LAVON BOTHWELL, male    DOB: 08/20/1940, 80 y.o.   MRN: [id number]  HPI He is here for hsfu He has a history of rheumatic heart disease s/p aortic and mv replacement remotely along with chronic afib, CHF and recent SDH requiring craniotomy in May 2022.  He also had a right MCA stroke after that with left paresis and was hospitalized last month with Enterococcus faecalis bacteremia.  TEE was notable for a vegetation on the sewing ring and leaflets of the MV c/w a prosthetic mitral valve endocarditis.  He was started on ampicillin + ceftriaxone with a plan for 6 weeks through 11/24 followed by chronic suppression planned ongoing after that.  I was recently contacted by his caregiver at [place] with concern for fever and elevated inflammatorymarkers and blood cultures done and remain ngtd.  Fever though not higher than 99, no temperature of 101, 102.  He is here with his wife who gives the history. She reports he has had an ongoing tremor of all extremities recently, over the last couple of weeks.  Asking if it could be dehydration-related.     Review of Systems  Constitutional:  Negative for chills, fatigue and fever.  Gastrointestinal:  Negative for diarrhea and nausea.  Skin:  Negative for rash.      Objective:   Physical Exam Eyes:     General: No scleral icterus. Pulmonary:     Effort: Pulmonary effort is normal.  Skin:    Findings: No rash.  Neurological:     Mental Status: He is alert.   SH: lives at [place]:

## 2021-01-18 NOTE — Assessment & Plan Note (Addendum)
This is a new issue and ongoing for about 2-3 weeks or so.  Unclear etiology.  Medication side effect vs dehydration vs other.  Can try hydration first and discuss with neurology at follow up if lacosamide a possible cause.    39 mintues spent with the patient including 50% face to face in discussion of the issues and also extensive review of the history.

## 2021-01-19 DIAGNOSIS — I69391 Dysphagia following cerebral infarction: Secondary | ICD-10-CM | POA: Diagnosis not present

## 2021-01-19 DIAGNOSIS — S065X3S Traumatic subdural hemorrhage with loss of consciousness of 1 hour to 5 hours 59 minutes, sequela: Secondary | ICD-10-CM | POA: Diagnosis not present

## 2021-01-19 DIAGNOSIS — R41840 Attention and concentration deficit: Secondary | ICD-10-CM | POA: Diagnosis not present

## 2021-01-19 DIAGNOSIS — R601 Generalized edema: Secondary | ICD-10-CM | POA: Diagnosis not present

## 2021-01-19 DIAGNOSIS — G9341 Metabolic encephalopathy: Secondary | ICD-10-CM | POA: Diagnosis not present

## 2021-01-19 DIAGNOSIS — M6389 Disorders of muscle in diseases classified elsewhere, multiple sites: Secondary | ICD-10-CM | POA: Diagnosis not present

## 2021-01-19 DIAGNOSIS — R2689 Other abnormalities of gait and mobility: Secondary | ICD-10-CM | POA: Diagnosis not present

## 2021-01-19 DIAGNOSIS — I6601 Occlusion and stenosis of right middle cerebral artery: Secondary | ICD-10-CM | POA: Diagnosis not present

## 2021-01-19 DIAGNOSIS — R41841 Cognitive communication deficit: Secondary | ICD-10-CM | POA: Diagnosis not present

## 2021-01-19 DIAGNOSIS — M5459 Other low back pain: Secondary | ICD-10-CM | POA: Diagnosis not present

## 2021-01-19 DIAGNOSIS — I5032 Chronic diastolic (congestive) heart failure: Secondary | ICD-10-CM | POA: Diagnosis not present

## 2021-01-19 DIAGNOSIS — H538 Other visual disturbances: Secondary | ICD-10-CM | POA: Diagnosis not present

## 2021-01-19 DIAGNOSIS — A4189 Other specified sepsis: Secondary | ICD-10-CM | POA: Diagnosis not present

## 2021-01-19 DIAGNOSIS — I10 Essential (primary) hypertension: Secondary | ICD-10-CM | POA: Diagnosis not present

## 2021-01-19 DIAGNOSIS — R278 Other lack of coordination: Secondary | ICD-10-CM | POA: Diagnosis not present

## 2021-01-19 LAB — CBC: RBC: 3.07 — AB (ref 3.87–5.11)

## 2021-01-20 ENCOUNTER — Non-Acute Institutional Stay (SKILLED_NURSING_FACILITY): Payer: Medicare Other | Admitting: Adult Health

## 2021-01-20 ENCOUNTER — Other Ambulatory Visit: Payer: Self-pay

## 2021-01-20 ENCOUNTER — Ambulatory Visit (HOSPITAL_BASED_OUTPATIENT_CLINIC_OR_DEPARTMENT_OTHER)
Admission: RE | Admit: 2021-01-20 | Discharge: 2021-01-20 | Disposition: A | Discharge status: Home / Self Care | Payer: Medicare Other | Source: Ambulatory Visit | Attending: Family Medicine | Admitting: Family Medicine

## 2021-01-20 ENCOUNTER — Encounter (HOSPITAL_COMMUNITY): Payer: Self-pay

## 2021-01-20 ENCOUNTER — Encounter: Payer: Self-pay | Admitting: Adult Health

## 2021-01-20 VITALS — BP 120/74 | HR 95 | Wt 184.8 lb

## 2021-01-20 DIAGNOSIS — I4891 Unspecified atrial fibrillation: Secondary | ICD-10-CM | POA: Insufficient documentation

## 2021-01-20 DIAGNOSIS — Z993 Dependence on wheelchair: Secondary | ICD-10-CM | POA: Insufficient documentation

## 2021-01-20 DIAGNOSIS — B9689 Other specified bacterial agents as the cause of diseases classified elsewhere: Secondary | ICD-10-CM | POA: Insufficient documentation

## 2021-01-20 DIAGNOSIS — I110 Hypertensive heart disease with heart failure: Secondary | ICD-10-CM | POA: Diagnosis not present

## 2021-01-20 DIAGNOSIS — R251 Tremor, unspecified: Secondary | ICD-10-CM | POA: Diagnosis not present

## 2021-01-20 DIAGNOSIS — N403 Nodular prostate with lower urinary tract symptoms: Secondary | ICD-10-CM | POA: Diagnosis not present

## 2021-01-20 DIAGNOSIS — I5032 Chronic diastolic (congestive) heart failure: Secondary | ICD-10-CM

## 2021-01-20 DIAGNOSIS — R0602 Shortness of breath: Secondary | ICD-10-CM

## 2021-01-20 DIAGNOSIS — Z7901 Long term (current) use of anticoagulants: Secondary | ICD-10-CM | POA: Insufficient documentation

## 2021-01-20 DIAGNOSIS — Z8249 Family history of ischemic heart disease and other diseases of the circulatory system: Secondary | ICD-10-CM | POA: Insufficient documentation

## 2021-01-20 DIAGNOSIS — R7989 Other specified abnormal findings of blood chemistry: Secondary | ICD-10-CM | POA: Insufficient documentation

## 2021-01-20 DIAGNOSIS — G9389 Other specified disorders of brain: Secondary | ICD-10-CM | POA: Diagnosis not present

## 2021-01-20 DIAGNOSIS — Z9981 Dependence on supplemental oxygen: Secondary | ICD-10-CM | POA: Insufficient documentation

## 2021-01-20 DIAGNOSIS — Z6828 Body mass index (BMI) 28.0-28.9, adult: Secondary | ICD-10-CM | POA: Insufficient documentation

## 2021-01-20 DIAGNOSIS — R41840 Attention and concentration deficit: Secondary | ICD-10-CM | POA: Diagnosis not present

## 2021-01-20 DIAGNOSIS — I4892 Unspecified atrial flutter: Secondary | ICD-10-CM

## 2021-01-20 DIAGNOSIS — R0902 Hypoxemia: Secondary | ICD-10-CM | POA: Diagnosis not present

## 2021-01-20 DIAGNOSIS — Z20822 Contact with and (suspected) exposure to covid-19: Secondary | ICD-10-CM | POA: Diagnosis not present

## 2021-01-20 DIAGNOSIS — Z79899 Other long term (current) drug therapy: Secondary | ICD-10-CM | POA: Insufficient documentation

## 2021-01-20 DIAGNOSIS — Z7401 Bed confinement status: Secondary | ICD-10-CM | POA: Insufficient documentation

## 2021-01-20 DIAGNOSIS — R7881 Bacteremia: Secondary | ICD-10-CM | POA: Diagnosis not present

## 2021-01-20 DIAGNOSIS — I38 Endocarditis, valve unspecified: Secondary | ICD-10-CM | POA: Insufficient documentation

## 2021-01-20 DIAGNOSIS — Z66 Do not resuscitate: Secondary | ICD-10-CM | POA: Diagnosis not present

## 2021-01-20 DIAGNOSIS — G252 Other specified forms of tremor: Secondary | ICD-10-CM

## 2021-01-20 DIAGNOSIS — B952 Enterococcus as the cause of diseases classified elsewhere: Secondary | ICD-10-CM | POA: Insufficient documentation

## 2021-01-20 DIAGNOSIS — S065X3S Traumatic subdural hemorrhage with loss of consciousness of 1 hour to 5 hours 59 minutes, sequela: Secondary | ICD-10-CM | POA: Diagnosis not present

## 2021-01-20 DIAGNOSIS — G9340 Encephalopathy, unspecified: Secondary | ICD-10-CM

## 2021-01-20 DIAGNOSIS — G9341 Metabolic encephalopathy: Secondary | ICD-10-CM | POA: Diagnosis not present

## 2021-01-20 DIAGNOSIS — Z953 Presence of xenogenic heart valve: Secondary | ICD-10-CM | POA: Insufficient documentation

## 2021-01-20 DIAGNOSIS — J9601 Acute respiratory failure with hypoxia: Secondary | ICD-10-CM | POA: Diagnosis not present

## 2021-01-20 DIAGNOSIS — N401 Enlarged prostate with lower urinary tract symptoms: Secondary | ICD-10-CM | POA: Diagnosis not present

## 2021-01-20 DIAGNOSIS — I6601 Occlusion and stenosis of right middle cerebral artery: Secondary | ICD-10-CM | POA: Diagnosis not present

## 2021-01-20 DIAGNOSIS — I69154 Hemiplegia and hemiparesis following nontraumatic intracerebral hemorrhage affecting left non-dominant side: Secondary | ICD-10-CM | POA: Insufficient documentation

## 2021-01-20 DIAGNOSIS — I059 Rheumatic mitral valve disease, unspecified: Secondary | ICD-10-CM

## 2021-01-20 DIAGNOSIS — T826XXD Infection and inflammatory reaction due to cardiac valve prosthesis, subsequent encounter: Secondary | ICD-10-CM

## 2021-01-20 DIAGNOSIS — I482 Chronic atrial fibrillation, unspecified: Secondary | ICD-10-CM

## 2021-01-20 DIAGNOSIS — I7121 Aneurysm of the ascending aorta, without rupture: Secondary | ICD-10-CM | POA: Insufficient documentation

## 2021-01-20 DIAGNOSIS — R601 Generalized edema: Secondary | ICD-10-CM | POA: Diagnosis not present

## 2021-01-20 DIAGNOSIS — I6529 Occlusion and stenosis of unspecified carotid artery: Secondary | ICD-10-CM | POA: Diagnosis not present

## 2021-01-20 DIAGNOSIS — I441 Atrioventricular block, second degree: Secondary | ICD-10-CM | POA: Diagnosis not present

## 2021-01-20 DIAGNOSIS — R338 Other retention of urine: Secondary | ICD-10-CM | POA: Diagnosis not present

## 2021-01-20 DIAGNOSIS — R278 Other lack of coordination: Secondary | ICD-10-CM | POA: Diagnosis not present

## 2021-01-20 DIAGNOSIS — E870 Hyperosmolality and hypernatremia: Secondary | ICD-10-CM | POA: Diagnosis not present

## 2021-01-20 DIAGNOSIS — H538 Other visual disturbances: Secondary | ICD-10-CM | POA: Diagnosis not present

## 2021-01-20 DIAGNOSIS — I5033 Acute on chronic diastolic (congestive) heart failure: Secondary | ICD-10-CM | POA: Diagnosis not present

## 2021-01-20 DIAGNOSIS — M5459 Other low back pain: Secondary | ICD-10-CM | POA: Diagnosis not present

## 2021-01-20 DIAGNOSIS — R41841 Cognitive communication deficit: Secondary | ICD-10-CM | POA: Diagnosis not present

## 2021-01-20 DIAGNOSIS — I517 Cardiomegaly: Secondary | ICD-10-CM | POA: Diagnosis not present

## 2021-01-20 DIAGNOSIS — I330 Acute and subacute infective endocarditis: Secondary | ICD-10-CM | POA: Diagnosis not present

## 2021-01-20 DIAGNOSIS — M6389 Disorders of muscle in diseases classified elsewhere, multiple sites: Secondary | ICD-10-CM | POA: Diagnosis not present

## 2021-01-20 DIAGNOSIS — Z7189 Other specified counseling: Secondary | ICD-10-CM

## 2021-01-20 DIAGNOSIS — E46 Unspecified protein-calorie malnutrition: Secondary | ICD-10-CM | POA: Diagnosis not present

## 2021-01-20 DIAGNOSIS — I4819 Other persistent atrial fibrillation: Secondary | ICD-10-CM | POA: Diagnosis not present

## 2021-01-20 DIAGNOSIS — I69391 Dysphagia following cerebral infarction: Secondary | ICD-10-CM | POA: Diagnosis not present

## 2021-01-20 DIAGNOSIS — I10 Essential (primary) hypertension: Secondary | ICD-10-CM | POA: Diagnosis not present

## 2021-01-20 DIAGNOSIS — R2689 Other abnormalities of gait and mobility: Secondary | ICD-10-CM | POA: Diagnosis not present

## 2021-01-20 DIAGNOSIS — Z8673 Personal history of transient ischemic attack (TIA), and cerebral infarction without residual deficits: Secondary | ICD-10-CM | POA: Diagnosis not present

## 2021-01-20 DIAGNOSIS — R0600 Dyspnea, unspecified: Secondary | ICD-10-CM | POA: Diagnosis not present

## 2021-01-20 DIAGNOSIS — I69354 Hemiplegia and hemiparesis following cerebral infarction affecting left non-dominant side: Secondary | ICD-10-CM | POA: Diagnosis not present

## 2021-01-20 LAB — COMPREHENSIVE METABOLIC PANEL
ALT: 35 U/L (ref 0–44)
AST: 27 U/L (ref 15–41)
Albumin: 2.5 g/dL — ABNORMAL LOW (ref 3.5–5.0)
Alkaline Phosphatase: 115 U/L (ref 38–126)
Anion gap: 10 (ref 5–15)
BUN: 14 mg/dL (ref 8–23)
CO2: 22 mmol/L (ref 22–32)
Calcium: 8.6 mg/dL — ABNORMAL LOW (ref 8.9–10.3)
Chloride: 108 mmol/L (ref 98–111)
Creatinine, Ser: 0.92 mg/dL (ref 0.61–1.24)
GFR, Estimated: >60 mL/min (ref >60)
Glucose, Bld: 121 mg/dL — ABNORMAL HIGH (ref 70–99)
Potassium: 3.6 mmol/L (ref 3.5–5.1)
Sodium: 140 mmol/L (ref 135–145)
Total Bilirubin: 0.6 mg/dL (ref 0.3–1.2)
Total Protein: 6.9 g/dL (ref 6.5–8.1)

## 2021-01-20 LAB — CBC
HCT: 26.8 % — ABNORMAL LOW (ref 39.0–52.0)
Hemoglobin: 8.2 g/dL — ABNORMAL LOW (ref 13.0–17.0)
MCH: 27.7 pg (ref 26.0–34.0)
MCHC: 30.6 g/dL (ref 30.0–36.0)
MCV: 90.5 fL (ref 80.0–100.0)
Platelets: 448 10*3/uL — ABNORMAL HIGH (ref 150–400)
RBC: 2.96 MIL/uL — ABNORMAL LOW (ref 4.22–5.81)
RDW: 16.9 % — ABNORMAL HIGH (ref 11.5–15.5)
WBC: 15.1 10*3/uL — ABNORMAL HIGH (ref 4.0–10.5)
nRBC: 0 % (ref 0.0–0.2)

## 2021-01-20 LAB — BRAIN NATRIURETIC PEPTIDE: B Natriuretic Peptide: 286.6 pg/mL — ABNORMAL HIGH (ref 0.0–100.0)

## 2021-01-20 LAB — C-REACTIVE PROTEIN: CRP: 8.3

## 2021-01-20 MED ORDER — POTASSIUM CHLORIDE CRYS ER 10 MEQ PO TBCR: 10.0000 meq | EXTENDED_RELEASE_TABLET | Freq: Every day | ORAL | 11 refills | Status: DC

## 2021-01-20 MED ORDER — TORSEMIDE 20 MG PO TABS: 20.0000 mg | ORAL_TABLET | Freq: Every day | ORAL | 11 refills | Status: DC

## 2021-01-20 NOTE — Patient Instructions (Addendum)
Labs done today. We will contact you only if your labs are abnormal.  START Torsemide 20mg  (1 tablet) by mouth daily.  START Potassium 87meq(1 tablet) by mouth daily.  No other medication changes were made. Please continue all current medications as prescribed.  A chest x-ray takes a picture of the organs and structures inside the chest, including the heart, lungs, and blood vessels. This test can show several things, including, whether the heart is enlarges; whether fluid is building up in the lungs; and whether pacemaker / defibrillator leads are still in place.   Your physician recommends that you schedule a follow-up appointment in: 6 weeks with Dr. [name]  If you have any questions or concerns before your next appointment please send [place] a message through [facility] or call our office at [phone].    TO LEAVE A MESSAGE FOR THE NURSE SELECT OPTION 2, PLEASE LEAVE A MESSAGE INCLUDING: YOUR NAME DATE OF BIRTH CALL BACK NUMBER REASON FOR CALL**this is important as we prioritize the call backs  YOU WILL RECEIVE A CALL BACK THE SAME DAY AS LONG AS YOU CALL BEFORE 4:00 PM   Do the following things EVERYDAY: Weigh yourself in the morning before breakfast. Write it down and keep it in a log. Take your medicines as prescribed Eat low salt foods--Limit salt (sodium) to 2000 mg per day.  Stay as active as you can everyday Limit all fluids for the day to less than 2 liters   At the [facility], you and your health needs are our priority. As part of our continuing mission to provide you with exceptional heart care, we have created designated Provider Care Teams. These Care Teams include your primary Cardiologist (physician) and Advanced Practice Providers (APPs- Physician Assistants and Nurse Practitioners) who all work together to provide you with the care you need, when you need it.   You may see any of the following providers on your designated Care Team at your next  follow up: Dr Glori Bickers Dr Haynes Kerns, NP Lyda Jester, [place] Audry Riles, PharmD   Please be sure to bring in all your medications bottles to every appointment.   You are scheduled for a Cardioversion on Wednesday November 23rd 2022 with Dr. Loralie Champagne.  Please arrive at the [facility] (Main Entrance A) at [facility]: [address] [place], [place] [address] at 10 am. (1 hour prior to procedure)  DIET: Nothing to eat or drink after midnight except a sip of water with medications (see medication instructions below)  FYI: For your safety, and to allow [place] to monitor your vital signs accurately during the surgery/procedure we request that   if you have artificial nails, gel coating, SNS etc. Please have those removed prior to your surgery/procedure. Not having the nail coverings /polish removed may result in cancellation or delay of your surgery/procedure.  Medication Instructions: Hold Torsemide the day of your procedure  You must have a responsible person to drive you home and stay in the waiting area during your procedure. Failure to do so could result in cancellation.  Bring your insurance cards.  *Special Note: Every effort is made to have your procedure done on time. Occasionally there are emergencies that occur at the hospital that may cause delays. Please be patient if a delay does occur.

## 2021-01-20 NOTE — Progress Notes (Addendum)
Date:  01/20/2021   ID:  [name] [name], DOB 1940-06-29, MRN [id number]    Provider location: [facility] Type of Visit: Established patient  PCP:  [name], MD  Cardiologist:  Dr. [name]   History of Present Illness: [name] [name] is a 80 y.o. 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  He also has an ascending aortic aneurysm.   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  I started him on dronedarone and coumadin with plan for cardioversion after 4 wks of therapeutic anticoagulation if he did not convert back to NSR on his own.  He went back into NSR spontaneously and later stopped coumadin. Atrial flutter recurred in 3/15 and again resolved spontaneously.   In 6/15, patient was admitted with transient dysarthria (resolved in about 10 minutes).  However, he was found to have evidence for CVA by MRI.  Therefore, he was started on coumadin. He was noted to be in atypical atrial flutter again.  Ranolazine was added to dronedarone.     In 8/15, Mr [name] was seen in consultation at the [facility] clinic.  He had atrial flutter ablation at [facility] in 11/15.     He was seen in followup at [facility] in 2/16.  Ranolazine was stopped.  Echo in 2/16 showed EF 55% with stable bioprosthetic mitral and aortic valves.  CTA chest showed stable 5 cm ascending aortic aneurysm.  He went back into atypical atrial flutter in 4/16.  Ranolazine was restarted and he was cardioverted back to NSR.      Cardiolite in 12/18 showed no ischemia. He wore a 30 day monitor in 12/18 with no arrhythmia.     He went into atrial flutter again during a respiratory illness in 2/19.  On 04/24/17, he was cardioverted back to NSR.  He thinks that he went into atrial flutter again later on that day. He was back in atrial flutter at followup  appointment. He was then admitted for Tikosyn loading in 4/19 and was cardioverted back to NSR after the load.    He had atrial fibrillation again in 6/19 with DCCV.    He was seen at the [name] in 9/19.  Echo showed normal LV and RV systolic function, bioprosthetic MV with mean gradient up to 9 mmHg, normal bioprosthetic AoV.  CTA chest showed aortic root up to 5.3 cm.    He was back in atrial flutter in 12/19, had DCCV back to NSR.  Echo in 12/19 showed EF 60-65%, normal bioprosthetic aortic valve, bioprosthetic mitral valve with mean gradient 4 mmHg and no MR, severe LAE, aortic root 4.8 cm.   He went to the [name] in 3/20.  Echo at that time showed EF 60-65% with stable bioprosthetic mitral and aortic valves (mean gradient 7 mmHg across MV and 9 mmHg across AoV).  CTA showed 5.0 cm aortic root and ascending aorta, stable.  He returned to the [facility] in 7/20.  He had atrial fibrillation/flutter ablation again and thinks he has been in NSR since that time.  CTA chest showed 5.1 cm ascending thoracic aorta. Seen at [facility] in 7/21.  Echo showed stable bioprosthetic mitral and aortic valves with normal EF.  CTA chest showed 5.0 cm ascending aortic aneurysm.   Last visit  at [facility] clinic 1/22 he was doing well, exercising and euvolemic in NSR.  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 He subsequently developed right MCA territory stroke (as Eliquis was held) in June 2022, requiring thrombectomy and was discharged to SNF.   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 Echo showed EF-65-70%, no obvious vegetation. Cards consulted and TEE showed bioprosthetic MV vegetation with trivial MR and mean gradient 8. ID consulted and he will continue on Rocephin/ampicillin via PICC with a stop date of  01/28/21 then continue on amoxicillin 500 mg bid indefinitely. Hospital course complicated by prolonged encephalopathy.  Neurology consulted and MRI brian showed new left cerebellar vermis CVA, likely septic emboli in the setting of mitral valve endocarditis.  He was discharged back to SNF, weight 187 lbs.  Today he returns for post hospital HF follow up with his wife. He lives at [name] in the skilled nursing section. She provides most of history today.   Not eating or drinking as much. Cognition waxes and wanes. He continues w/ foley catheter for retention issues. He has new oxygen requirements since last night, now on 3.5 L. He is WC bound and unable to lie flat. He was seen by NP at facility this AM and given lasix 20 mg po x 1 and nebs, but has not urinated since. He has generalized tremors. He continues to receive IV abx.  ECG (12/12/11): Atypical atrial flutter versus atrial tachycardia with rate around 100 bpm, QTc normal.  ECG (12/29/11): NSR, 1st degree AV block PR 242 msec.    ECG (12/13): NSR, 1st degree AV block PR 244 msec    ECG (10/14): NSR, 1st degree AV block ECG (3/15): Atypical atrial flutter with rate 87, inferior T wave inversions ECG (6/15): Atypical atrial flutter with rate 77 ECG (8/15): atypical atrial flutter rate 82 ECG (10/15): NSR, 1st degree AV block ECG (12/15): NSR, 1st degree AV block, QTc 410 msec ECG (5/16): NSR, 1st degree AV block, QTc 419 msec ECG (5/17): NSR, 1st degree AV block 270 msec, QTc 433 ECG (2/18): NSR, 1st degree AVB, nonspecific ST-T changes, QTc 435 msec ECG (11/18): NSR, 1st degree AVB, QTc 424 msec ECG (2/19, personally reviewed): atypical atrial flutter at 87, QTc 442 msec ECG (4/19, personally reviewed): NSR, 1st degree AVB, QTc 453 msec ECG (10/19, personally reviewed): NSR, 1st degree AVB, QTc 448 msec ECG (1/20, personally reviewed): atypical atrial flutter in 90s, QTc 472 msec.  ECG (8/20): NSR, 1st degree AVB, QTc 448 msec  ECG  (1/21, personally reviewed): NSR, 1st degree AVB, QTc 434 msec ECG (5/21, personally reviewed): NSR, 1st degree AVB, QTc 447 ECG (1/22, personally reviewed): NSR, 1st degree AVB (306 msec), QTc 446 msec   Labs (3/14): LDL 64, TSH normal, K 3.9, creatinine 1.0     Labs (6/15): LDL 54, HDL 48, creatinine 0.95   Labs (9/15): LDL 54, HDL 25 Labs (10/15): K 4.5, creatinine 1.3, AST 45, ALT 64 Labs (4/16): K 4.3, creatinine 1.28, HCT 40.7 Labs (5/17): K 4.1, creatinine 1.16, LDL 66, HDL 47 Labs (2/18): LDL 42, HDL 43, K 4.4, creatinine 1.48 Labs (3/18): hgb 13.8 Labs (2/19): LDL 59 Labs (4/19): K 3.9, creatinine 0.97, Mg 2.0 Labs (6/19): K 4.4, creatinine 0.9 Labs (10/19): LDL 84 Labs (12/19): K 4.2, creatinine 1.13, hgb 13.9 Labs (3/20): K 4.1, creatinine 0.98 Labs (6/20): K 4.1,  creatinine 0.91, LDL 103  Labs (9/20): K 4, creatinine 0.95 Labs (1/21): K 4, creatinine 0.94, LDL 80 Labs (5/21): K 4.3, creatinine 0.92 Labs (11/22): K 3.2, creatinine 0.6                                                                                                                              PMH: 1. Atrial fibrillation: s/p MAZE and LAA ligation in 2006.  No documented atrial fibrillation since operation.  2. Atypical atrial flutter: 10/13, 3/15, and again in 6/15.  Recurrent 4/16 with DCCV.  - Event monitor (12/18): no significant arrhythmia.  - Recurrence in 2/19: DCCV to NSR but quickly recurred.  - Admitted in 4/19 for Tikosyn loading and cardioverted to NSR.  - DCCV to NSR in 6/19.  - DCCV to NSR in 12/19. - 7/20 redo atrial flutter/fibrillation ablation.   3. 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   - Echo 5/11 with EF 60-65%, normal bioprosthetic aortic valve, normal bioprosthetic mitral valve, moderate LAE.   - Echo 11/12 at [facility] looked ok per report.  - Echo (10/13) with EF 55-60%, mild LVH, normal bioprosthetic  aortic and mitral valves, moderate LAE, PA systolic pressure 35 mmHg.   - Echo (6/15) with EF 60%, moderate LVH, normal-appearing bioprosthetic MV and AoV, PA systolic pressure 32 mmHg.   - Echo 9/15 [facility]) with EF 55%, mild LVH, PASP 52 mmHg, severe LAE, bioprosthetic mitral valve with no MR and mean gradient 11 mmHg, moderate TR, bioprosthetic aortic valve with no AI and mean gradient 9 mmHg, dilated ascending aorta to 5 cm.   - Echo (2/16, [facility]) with EF 55%, stable bioprosthetic aortic and mitral valves (AoV mean gradient 7 mmHg, MV mean gradient 4 mmHg), normal RV size and systolic function.  - Echo (12/16, [facility]) with EF 58%, mild RV dilation with low normal systolic function, PA systolic pressure 34 mmHg, bioprosthetic MV and AoV functioning normally.  - Echo (5/18, [facility]) with EF 60-65%, RV normal size and systolic function, moderate LAE, bioprosthetic MV mean gradient 6 with no MR, bioprosthetic aortic valve with mean gradient 11 and no AI, PASP 41 mmHg.  - Echo (2/19): EF 60-65%, moderate LVH, bioprosthetic aortic and mitral valves functioning normally, PASP 23 mmHg, aortic root 4.5 cm.  - Echo (9/19): EF 56%, normal RV size and systolic function, bioprosthetic MV with mean gradient 9 mmHg, trivial MR, bioprosthetic aortic valve with mean gradient 12 mmHg, PASP 51 mmHg.  - Echo (12/19): EF 60-65%, normal bioprosthetic aortic valve, bioprosthetic mitral valve with mean gradient 4 mmHg and no MR, severe LAE, aortic root 4.8 cm.  - Echo (3/20, [facility]): EF 60-65%, normal RV, severe LAE, 5.0 cm ascending aorta, bioprosthetic MV with mean gradient 7 mmHg, bioprosthetic aortic valve with mean gradient 9 mmHg.  - Echo (7/20, [facility]): EF 55-60%, moderate septal  hypertrophy, normal RV size and systolic function, bioprosthetic MV with trace MR and mean gradient 4, bioprosthetic aortic valve with mean gradient 14 mmHg, severe LAE.  - Echo  (7/21, [facility]): EF 60%, normal RV, s/p bioprosthetic mitral valve with trace MR and mean gradient 5, s/p bioprosthetic aortic valve with trace AI and mean gradient 15 mmHg, ascending aorta 4.9 cm.  - Echo (10/22): EF 65-70%, normal RV, bioprosthetic AVR mean gradient 16 mmHg appears normal, bioprosthetic MV mean gradient 10, increased from prior but HR also in 100s when study done.   - TEE 10/22 bioprosthetic MV vegetation with trivial MR and mean gradient 8.  4. Hyperlipidemia 5. L-spine surgery 6. LHC with no significant coronary disease in 2006 pre-op.  - Cardiolite (12/18): No ischemia.  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 - SDH 6/22 with craniotomy followed by MCA CVA due to occluded ICA while off Eliquis (revascularized).  - small left cerebellar infarct possibly embolic from endocarditis during 10/22 hospitalization. 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  CTA chest 9/15 with dilated sinuses of valsalva to 5.0 cm and ascending aorta 4.9 cm. CTA chest (2/16) with 5.0 cm ascending aorta.  CTA chest (12/16) with 4.9 cm aortic root at SOV, 4.8 cm ascending aorta.  - CT chest (5/18, [facility]): 5.0 cm aortic root, 5.0 cm ascending aorta.  - CTA chest (10/19, [facility]): 5.3 cm aortic root, 5.2 cm ascending aorta.  - CTA chest (3/20, [facility]): 5.0 cm aortic root and ascending aorta - CTA chest (7/20, [facility]): 5.1 cm ascending aorta - CTA chest (7/21, [facility]): 5.1 cm aortic root, 5.0 cm ascending aorta.  9. Chronic diastolic CHF - Echo 76/80 EF 65-70%, normal RV  Current Outpatient Medications  Medication Sig Dispense Refill   amantadine (SYMMETREL) 50 MG/5ML solution 8 AM and 2 PM     amLODipine (NORVASC) 10 MG tablet Take 1 tablet (10 mg total) by mouth daily.     ampicillin IVPB Inject 12 g into the vein daily. As a continuous infusion. Indication:  Enterococcus endocarditis   First Dose: Yes Last Day of Therapy:  01/28/21 Labs - Once weekly:  CBC/D and BMP, Labs - Every other week:  ESR and CRP Method of administration: Ambulatory Pump (Continuous Infusion) Method of administration may be changed at the discretion of home infusion pharmacist based upon assessment of the patient and/or caregiver's ability to self-administer the medication ordered. 34 Units 0   apixaban (ELIQUIS) 5 MG TABS tablet Take 1 tablet (5 mg total) by mouth 2 (two) times daily. 60 tablet 0   atorvastatin (LIPITOR) 80 MG tablet Take 1 tablet (80 mg total) by mouth daily.     bisacodyl (DULCOLAX) 10 MG suppository Place 1 suppository (10 mg total) rectally daily as needed for moderate constipation. 12 suppository 0   cefTRIAXone (ROCEPHIN) IVPB Inject 2 g into the vein every 12 (twelve) hours. Indication:  Enterococcal endocarditis  First Dose: Yes Last Day of Therapy:  01/28/21 Labs - Once weekly:  CBC/D and BMP, Labs - Every other week:  ESR and CRP Method of administration: IV Push Method of administration may be changed at the discretion of home infusion pharmacist based upon assessment of the patient and/or caregiver's ability to self-administer the medication ordered. 68 Units 0   clobetasol cream (TEMOVATE) 8.81 % Apply 1 application topically daily as needed (for skin irritation).      docusate sodium (COLACE) 100 MG  capsule Take 1 capsule (100 mg total) by mouth 2 (two) times daily. 30 capsule 0   dofetilide (TIKOSYN) 125 MCG capsule Take 3 capsules (375 mcg total) by mouth 2 (two) times daily.     famotidine (PEPCID) 20 MG tablet Take 20 mg by mouth at bedtime as needed.     feeding supplement, GLUCERNA SHAKE, (GLUCERNA SHAKE) LIQD Take 237 mLs by mouth 2 (two) times daily between meals. For weight loss     ferrous sulfate 220 (44 Fe) MG/5ML solution Take 240 mg by mouth every Monday, Wednesday, and Friday.     finasteride (PROSCAR) 5 MG tablet Take 1 tablet (5 mg total) by mouth  daily. 30 tablet 0   hydrocortisone cream 1 % Apply topically 2 (two) times daily as needed for itching (rash). 30 g 0   ipratropium-albuterol (DUONEB) 0.5-2.5 (3) MG/3ML SOLN Take 3 mLs by nebulization every 6 (six) hours as needed.     lacosamide (VIMPAT) 50 MG TABS tablet Take 1 tablet (50 mg total) by mouth 2 (two) times daily. 60 tablet    loratadine (CLARITIN) 10 MG tablet Take 10 mg by mouth at bedtime.     LORazepam (ATIVAN) 0.5 MG tablet Take 0.5 mg by mouth every 6 (six) hours as needed for anxiety (x 72 hours).     magnesium gluconate (MAGONATE) 500 MG tablet Take 1 tablet (500 mg total) by mouth daily. 30 tablet 0   Multiple Vitamin (MULTIVITAMIN WITH MINERALS) TABS tablet Take 1 tablet by mouth daily.     polyethylene glycol (MIRALAX / GLYCOLAX) 17 g packet Take 17 g by mouth daily as needed.     polyvinyl alcohol (LIQUIFILM TEARS) 1.4 % ophthalmic solution Place 1 drop into both eyes 2 (two) times daily. 15 mL 0   potassium chloride SA (KLOR-CON) 10 MEQ tablet Take 1 tablet (10 mEq total) by mouth daily. 30 tablet 11   tamsulosin (FLOMAX) 0.4 MG CAPS capsule Take 0.4 mg by mouth daily.     torsemide (DEMADEX) 20 MG tablet Take 1 tablet (20 mg total) by mouth daily. 30 tablet 11   No current facility-administered medications for this encounter.    Allergies:   Naproxen and No healthtouch food allergies   Social History:  The patient  reports that he has never smoked. He has never used smokeless tobacco. He reports that he does not drink alcohol and does not use drugs.   Family History:  The patient's family history includes Cancer in his father; Diabetes in his brother and paternal aunt; Heart attack in his maternal grandfather; Macular degeneration in his mother.   ROS:  Please see the history of present illness.   All other systems are personally reviewed and negative.   Exam:   BP 120/74   Pulse 95   Wt 83.8 kg (184 lb 12.8 oz) Comment: Rehab weight from  yesterday  SpO2  94%   BMI 28.94 kg/m   General:  Arrived in geri-chair, on oxygen, chronically-ill appearing. HEENT: Normal Neck: Supple. JVP 7-8. Carotids 2+ bilat; no bruits. No lymphadenopathy or thryomegaly appreciated. Cor: PMI nondisplaced. Irregular rate & rhythm. No rubs, gallops, 2/6 SEM RUSB Lungs: Rhonchi throughout all lung fields, + wheezes Abdomen: Soft, nontender, nondistended. No hepatosplenomegaly. No bruits or masses. Good bowel sounds. + foley catheter, no urine noted in bag. Extremities: No cyanosis, clubbing, rash, edema; + right hand edema. Neuro: Alert & oriented x 1?, but with periods of intermittent confusion + agitation, cranial nerves grossly  intact. Moves all 4 extremities but + left-sided hemiparesis. + upper extremity tremors. Unable to consistently follow commands.  Recent Labs: 12/14/2020: TSH 1.567 01/03/2021: Magnesium 2.0 01/08/2021: ALT 42; B Natriuretic Peptide 319.00 01/18/2021: BUN 11; Creatinine 0.7; Hemoglobin 8.7; Platelets 415; Potassium 3.5; Sodium 142  Personally reviewed   Wt Readings from Last 3 Encounters:  01/20/21 83.8 kg (184 lb 12.8 oz)  01/20/21 83.8 kg (184 lb 12.8 oz)  01/11/21 81.7 kg (180 lb 3.2 oz)    ASSESSMENT AND PLAN 1. Chronic Diastolic Heart Failure: Most recent echo 10/22, EF 65-70%. Weight up 4 lbs. Chronically NYHA IIIb, confounded by physical deconditioning, minimal communication and left hemiparesis. Has new increased oxygen requirements and weight is up 4 lbs, he appears at least mildly volume overloaded on exam, likely due to return of atrial fibrillation. - Start torsemide 20 mg daily + 10 mEq of KCL. BMET/BNP today, repeat BMET in 1 week at facility. Follow sodium closely - Ensure foley catheter is flushed and urine output visualized after initiation of diuretic. - Per chart review, CXR 11/11 showed infiltrate vs CHF. Will repeat CXR today. Radiology has closed for the day, OK to have this done at [facility] or he can come back to  [facility] for imaging. 2. 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  Baseline, he is bed-bound and per wife communicates minimally, left hemiparesis.  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  He is alert today but still not communicating at prior baseline per his wife.  3. Hypernatremia: Resolved. BMET today and will follow closely with initiation of torsemide  + history of poor po intake. 4. Enterococcal bacteremia: Source uncertain.  No PNA noted on CXR.  Enterococcus did not grow in urine.  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  He has a new left cerebellar CVA (small) that may be embolic from MV vegetation.  Blood cultures from 10/10, 10/11, 10/13 were positive.  Cultures from 10/15 with S epidermidis, likely contaminant.  Resent 10/17, these cultures remain negative.  - Continues on ceftriaxone and ampicillin.  - Not candidate for cardiac surgery, management will have to be medical.  5. 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  Echo this admission with EF 65-70%, normal RV, bioprosthetic AVR mean gradient 16 mmHg appears normal, bioprosthetic MV mean gradient 10, increased from prior but HR also in 100s when study done.  TEE as above showed bioprosthetic MV vegetation with trivial MR and mean gradient 8.  6. Atrial fibrillation/Flutter: Maze in 2006 + 2 flutter/fib ablations at [facility]. Remains on decreased Tikosyn 375 mcg bid due to previously long QT. Unfortunately he is in atrial fibrillation today. HR 90s. - Continue Tikosyn 375 mcg bid.  - He is rate-controlled today, will hold off adding beta  blocker with volume overload. - Continue Eliquis. I personally called his facility and spoke with Santiago Glad, RN administering his medications, and he has not missed any doses. - Discussed with Dr. [name] and OK to arrange DCCV. Wife is hesitant due to his frailty, but would like to proceed. 7. Malnutrition: Albumin 1.5.  8. Elevated LFTs: Statin stopped.  Check labs today. 9. Tremors: Appears like  terminal agitation. Duo-nebs could be playing a role or urinary retention.  - May need neuro referral. ? Side effect of lacosamide. - Check labs today. 10: GOC: With multiple medical co-morbidities and declining functional capacity, would benefit from Hospice care. Will defer to primary.   Greater than 50% of the (total minutes 35) visit spent in counseling/coordination of care regarding (details of discussing symptom management for heart failure and treatment options for return of atrial fibrillation)   Will arrange for cardioversion with Dr. [name]. Will need repeat BMET in 1 week at facility.  Allena Katz, FNP-BC 01/20/2021  [facility] [address] [facility] [place] [address] [phone] (office) [phone] (fax)

## 2021-01-20 NOTE — Progress Notes (Addendum)
Location:  [facility] Room Number: 159-A Place of Service:  SNF [phone]) Provider:  Royal Hawthorn, NP   Patient Care Team: [name], MD as PCP - General (Internal Medicine) Larey Dresser, MD as PCP - Cardiology (Cardiology) [name], MD as Consulting Physician (Ophthalmology) [name], MD (Internal Medicine)  Extended Emergency Contact Information Primary Emergency Contact: [name] Address: [place], [place] [address] [name] of [place] Phone: [phone] Relation: Spouse Secondary Emergency Contact: [name], [place] [address] [name] of [place] Phone: [phone] Relation: Son  Code Status:  DNR Goals of care: Advanced Directive information Advanced Directives 01/20/2021  Does Patient Have a Medical Advance Directive? Yes  Type of Paramedic of [place];Living will;Out of facility DNR (pink MOST or yellow form)  Does patient want to make changes to medical advance directive? No - Patient declined  Copy of [place] in Chart? Yes - validated most recent copy scanned in chart (See row information)  Would patient like information on creating a medical advance directive? -  Pre-existing out of facility DNR order (yellow form or pink MOST form) -     Chief Complaint  Patient presents with   Acute Visit    Hypoxia    HPI:  Pt is a 80 y.o. male seen today for an acute visit for hypoxia. Nurse reports that last evening his sats were 83% and oxygen was applied, now at 3 liters [place] sats 90%.  He has edema around the abd and thigh area. Has had intermittent sob at rest in bed and difficulty lying flat at night. CXR 11/11 showed bibasilar infiltrates pna vs. CHF. No cough or sputum production.  Weight stable at 184 but significant increase from prior hospitalization in Oct (166-170)  Blood cultures done 01/14/21 show no growth  x 2 Has tremors in both hands and legs intermittently  PMH includes syncope with fall in May of 2022, TBI with SDH with craniotomy, acute right MCA s/p thrombectomy, mitral and aortic valve replacement, DM II, and afib.  Recently hospitalized 12/14/20-01/04/21 for acute metabolic encephalopathy due to hypernatremia and enterococcal bacteremia with prosthetic valve endocarditis. Blood cx grew enterococcus faecalis and urine culture grew staph aureus. Repeat Blood cx 10/17 negative. 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   External hemorrhoids without mention of complication    H/O aortic valve replacement    H/O mitral valve replacement    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   Lesion of ulnar nerve    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    Other and unspecified hyperlipidemia    Pre-diabetes    Previous back surgery 1978, jan 2007   Psychosexual dysfunction with inhibited sexual excitement    SOB (shortness of breath)    "with heavy exercise"   Stroke ([place]) 08/2013   "I WAS IN AFIB AND THREW  A CLOT, THE EFFECTS WERE TRANSITORY AND DIDNT LAST BUT FOR 30 MINUTES"    Thoracic aortic aneurysm    Past Surgical History:  Procedure Laterality Date   AORTIC AND MITRAL VALVE REPLACEMENT     09/2004   CARDIOVERSION     3 times from 2004-2006   CARDIOVERSION N/A 09/26/2013   Procedure: CARDIOVERSION;  Surgeon: Larey Dresser, MD;  Location: [facility];  Service: Cardiovascular;  Laterality: N/A;   CARDIOVERSION N/A 06/19/2014   Procedure: CARDIOVERSION;  Surgeon: Jerline Pain, MD;  Location: [facility];  Service: Cardiovascular;  Laterality: N/A;   CARDIOVERSION N/A 04/24/2017   Procedure: CARDIOVERSION;  Surgeon: Larey Dresser, MD;  Location: [facility];  Service: Cardiovascular;  Laterality: N/A;   CARDIOVERSION N/A 06/08/2017   Procedure: CARDIOVERSION;  Surgeon: Pixie Casino, MD;  Location[facility] ENDOSCOPY;  Service: Cardiovascular;  Laterality: N/A;   CARDIOVERSION N/A 08/24/2017   Procedure: CARDIOVERSION;  Surgeon: Skeet Latch, MD;  Location: [facility] ENDOSCOPY;  Service: Cardiovascular;  Laterality: N/A;   CARDIOVERSION N/A 02/14/2018   Procedure: CARDIOVERSION;  Surgeon: Larey Dresser, MD;  Location: [facility] ENDOSCOPY;  Service: Cardiovascular;  Laterality: N/A;   COLONOSCOPY     CRANIOTOMY N/A 07/28/2020   Procedure: CRANIOTOMY FOR EVACUATION OF SUBDURAL HEMATOMA;  Surgeon: Eustace Sparger, MD;  Location: [place];  Service: Neurosurgery;  Laterality: N/A;   IR ANGIO VERTEBRAL SEL SUBCLAVIAN INNOMINATE UNI R MOD SED  08/07/2020   IR CT HEAD LTD  08/07/2020   IR PERCUTANEOUS ART THROMBECTOMY/INFUSION INTRACRANIAL INC DIAG ANGIO  08/07/2020   laminectomies     10/1975 and in 03/2005   RADIOLOGY WITH ANESTHESIA N/A 08/07/2020   Procedure: IR WITH ANESTHESIA;  Surgeon: Luanne Bras, MD;  Location: [facility];  Service: Radiology;  Laterality: N/A;   TEE WITHOUT CARDIOVERSION N/A 12/18/2020   Procedure: TRANSESOPHAGEAL ECHOCARDIOGRAM (TEE);  Surgeon: Larey Dresser, MD;  Location: [facility] ENDOSCOPY;  Service: Cardiovascular;  Laterality: N/A;   [place] Right 04/03/2018   Procedure: TOTAL HIP ARTHROPLASTY ANTERIOR APPROACH;  Surgeon: Paralee Cancel, MD;  [facility];  Service: Orthopedics;  Laterality: Right;  70 mins    Allergies  Allergen Reactions   Naproxen Hives   No [facility] Food Allergies Other (See Comments)    Scallops - distress, nausea and vomitting    Outpatient Encounter Medications as of 01/20/2021  Medication Sig    amantadine (SYMMETREL) 50 MG/5ML solution 8 AM and 2 PM   amLODipine (NORVASC) 10 MG tablet Take 1 tablet (10 mg total) by mouth daily.   ampicillin IVPB Inject 12 g into the vein daily. As a continuous infusion. Indication:  Enterococcus endocarditis  First Dose: Yes Last Day of Therapy:  01/28/21 Labs - Once weekly:  CBC/D and BMP, Labs - Every other week:  ESR and CRP Method of administration: Ambulatory Pump (Continuous Infusion) Method of administration may be changed at the discretion of home infusion pharmacist based upon assessment of the patient and/or caregiver's ability to self-administer the medication ordered.   apixaban (ELIQUIS) 5 MG TABS tablet Take 1 tablet (5 mg total) by mouth 2 (two) times daily.   atorvastatin (LIPITOR) 80 MG tablet Take 1 tablet (80 mg total) by mouth daily.   bisacodyl (DULCOLAX) 10 MG suppository Place 1 suppository (10 mg total) rectally daily as needed for moderate constipation.   cefTRIAXone (ROCEPHIN) IVPB Inject 2 g into the vein every 12 (twelve)  hours. Indication:  Enterococcal endocarditis  First Dose: Yes Last Day of Therapy:  01/28/21 Labs - Once weekly:  CBC/D and BMP, Labs - Every other week:  ESR and CRP Method of administration: IV Push Method of administration may be changed at the discretion of home infusion pharmacist based upon assessment of the patient and/or caregiver's ability to self-administer the medication ordered.   clobetasol cream (TEMOVATE) 9.56 % Apply 1 application topically daily as needed (for skin irritation).    docusate sodium (COLACE) 100 MG capsule Take 1 capsule (100 mg total) by mouth 2 (two) times daily.   dofetilide (TIKOSYN) 125 MCG capsule Take 3 capsules (375 mcg total) by mouth 2 (two) times daily.   famotidine (PEPCID) 20 MG tablet Take 20 mg by mouth at bedtime as needed.   feeding supplement, GLUCERNA SHAKE, (GLUCERNA SHAKE) LIQD Take 237 mLs by mouth 2 (two) times daily between meals. For weight loss    ferrous sulfate 220 (44 Fe) MG/5ML solution Take 240 mg by mouth every Monday, Wednesday, and Friday.   finasteride (PROSCAR) 5 MG tablet Take 1 tablet (5 mg total) by mouth daily.   hydrocortisone cream 1 % Apply topically 2 (two) times daily as needed for itching (rash).   ipratropium-albuterol (DUONEB) 0.5-2.5 (3) MG/3ML SOLN Take 3 mLs by nebulization every 6 (six) hours as needed.   lacosamide (VIMPAT) 50 MG TABS tablet Take 1 tablet (50 mg total) by mouth 2 (two) times daily.   loratadine (CLARITIN) 10 MG tablet Take 10 mg by mouth at bedtime.   LORazepam (ATIVAN) 0.5 MG tablet Take 0.5 mg by mouth every 6 (six) hours as needed for anxiety (x 72 hours).   magnesium gluconate (MAGONATE) 500 MG tablet Take 1 tablet (500 mg total) by mouth daily.   Multiple Vitamin (MULTIVITAMIN WITH MINERALS) TABS tablet Take 1 tablet by mouth daily.   polyethylene glycol (MIRALAX / GLYCOLAX) 17 g packet Take 17 g by mouth daily as needed.   polyvinyl alcohol (LIQUIFILM TEARS) 1.4 % ophthalmic solution Place 1 drop into both eyes 2 (two) times daily.   tamsulosin (FLOMAX) 0.4 MG CAPS capsule Take 0.4 mg by mouth daily.   [DISCONTINUED] ferrous sulfate 325 (65 FE) MG EC tablet Take 325 mg by mouth 3 (three) times a week.   No facility-administered encounter medications on file as of 01/20/2021.    Review of Systems  Constitutional:  Negative for activity change, appetite change, chills, diaphoresis, fatigue, fever and unexpected weight change.  Respiratory:  Positive for shortness of breath. Negative for cough, wheezing and stridor.   Cardiovascular:  Positive for leg swelling. Negative for chest pain and palpitations.  Gastrointestinal:  Negative for abdominal distention, abdominal pain, constipation and diarrhea.  Genitourinary:  Negative for difficulty urinating and dysuria.  Musculoskeletal:  Positive for gait problem. Negative for arthralgias, back pain, joint swelling and myalgias.  Neurological:   Positive for tremors and weakness. Negative for dizziness, seizures, syncope, facial asymmetry, speech difficulty and headaches.  Hematological:  Negative for adenopathy. Does not bruise/bleed easily.  Psychiatric/Behavioral:  Positive for agitation and confusion. Negative for behavioral problems.    Immunization History  Administered Date(s) Administered   Fluad Quad(high Dose 65+) 12/06/2018, 12/09/2020   Influenza Whole 12/25/2008, 01/05/2010, 12/30/2011   Influenza, High Dose Seasonal PF 12/14/2016   Influenza,inj,Quad PF,6+ Mos 12/19/2013   Influenza,inj,quad, With Preservative 11/22/2017   Influenza-Unspecified 12/14/2015, 12/24/2019   Moderna Covid-19 Vaccine Bivalent Booster 25yr & up 01/12/2021   Moderna SARS-COV2 Booster  Vaccination 01/02/2020, 06/26/2020   Moderna Sars-Covid-2 Vaccination 04/08/2019, 05/05/2019   Pneumococcal Conjugate-13 12/19/2013   Pneumococcal Polysaccharide-23 09/24/2007, 05/22/2017   Td 12/25/2008   Tdap 03/22/2018   Zoster Recombinat (Shingrix) 07/16/2019, 10/28/2019   Zoster, Live 09/24/2007   Pertinent  Health Maintenance Due  Topic Date Due   FOOT EXAM  Never done   URINE MICROALBUMIN  Never done   OPHTHALMOLOGY EXAM  04/07/2021 (Originally 10/04/1950)   HEMOGLOBIN A1C  06/17/2021   INFLUENZA VACCINE  Completed   Fall Risk 01/03/2021 01/04/2021 01/04/2021 01/05/2021 01/18/2021  Falls in the past year? - - - - 1  Was there an injury with Fall? - - - - 1  Fall Risk Category Calculator - - - - 2  Fall Risk Category - - - - Moderate  Patient Fall Risk Level _0   Patient at Risk for Falls Due to - - - - History of fall(s);Impaired balance/gait;Impaired mobility  Patient at Risk for Falls Due to - - - - -  Fall risk Follow up - - - - Falls evaluation completed   Functional Status Survey:    Vitals:   01/20/21 1006  BP: 128/77  Pulse: (!) 108  Resp: (!) 22  Temp: 98.2  F (36.8 C)  SpO2: 90%  Weight: 184 lb 12.8 oz (83.8 kg)  Height: _1  (1.702 m)   Body mass index is 28.94 kg/m. Physical Exam Vitals and nursing note reviewed.  Constitutional:      General: He is not in acute distress.    Appearance: He is not diaphoretic.  HENT:     Head: Normocephalic and atraumatic.     Nose: Nose normal. No congestion.     Mouth/Throat:     Mouth: Mucous membranes are dry.     Pharynx: No oropharyngeal exudate.  Eyes:     Conjunctiva/sclera: Conjunctivae normal.     Pupils: Pupils are equal, round, and reactive to light.  Neck:     Thyroid: No thyromegaly.     Vascular: No JVD.     Trachea: No tracheal deviation.  Cardiovascular:     Rate and Rhythm: Normal rate and regular rhythm.     Heart sounds: No murmur heard. Pulmonary:     Effort: Pulmonary effort is normal. No respiratory distress.     Breath sounds: No wheezing.     Comments: Slight increased wob and decreased bases  Abdominal:     General: Bowel sounds are normal. There is no distension.     Palpations: Abdomen is soft.     Tenderness: There is no abdominal tenderness.  Musculoskeletal:     Comments: Edema to abd area and thighs   Lymphadenopathy:     Cervical: No cervical adenopathy.  Skin:    General: Skin is warm and dry.  Neurological:     Mental Status: He is alert.     Comments: Left sided weakness   Psychiatric:        Mood and Affect: Mood normal.    Labs reviewed: Recent Labs    12/18/20 1630 12/19/20 0610 12/19/20 1632 12/20/20 0153 01/01/21 1817 01/02/21 0240 01/03/21 0221 01/08/21 0000 01/11/21 0000 01/13/21 0000 01/18/21 0000  NA  --  139  --    < > 138 137 138   < > 152* 143 142  K  --  3.5  --    < > 3.8 4.3 3.7   < >  3.2* 4.6 3.5  CL  --  108  --    < > 109 106 109   < > 114* 109* 108  CO2  --  23  --    < > _0 < > 20 22 23*  GLUCOSE  --  186*  --    < > 135* 122* 100*  --   --   --   --   BUN  --  16  --    < > _1 < > _2 CREATININE  --  0.76  --    < > 0.84 0.88 0.93   < > 0.6 0.8 0.7  CALCIUM  --  7.7*  --    < > 8.2* 8.4* 8.3*   < > 7.2* 8.6* 8.8  MG 1.9 2.0 1.9   < > 2.1 2.0 2.0  --   --   --   --   PHOS 3.1 2.7 2.4*  --   --   --   --   --   --   --   --    < > = values in this interval not displayed.   Recent Labs    01/01/21 0042 01/02/21 0240 01/03/21 0221 01/08/21 0000  AST 37 52* 32 26  ALT 68* 77* 63* 42*  ALKPHOS 88 105 94 120  BILITOT 0.5 0.7 0.4  --   PROT 6.3* 6.4* 6.3*  --   ALBUMIN 1.9* 2.0* 1.9* 2.8*   Recent Labs    08/16/20 0427 08/17/20 0340 12/14/20 1245 12/14/20 1504 12/16/20 0130 12/18/20 0221 12/31/20 0101 01/01/21 0042 01/02/21 0240 01/08/21 0000 01/11/21 0000 01/13/21 0000 01/18/21 0000  WBC 7.9   < > 12.0*   < > 10.7*   < > 13.0* 10.5 10.3   < > 10.1 14.1 10.6  NEUTROABS 5.7  --  9.3*  --  7.7  --   --   --   --   --   --   --   --   HGB 9.0*   < > 11.7*   < > 9.9*   < > 8.2* 7.6* 7.9*   < > 7.7* 8.7* 8.7*  HCT 28.2*   < > 36.8*   < > 30.7*   < > 25.7* 24.4* 25.0*   < > 23* 28* 27*  MCV 98.6   < > 90.0   < > 88.0   < > 89.9 91.4 92.6  --   --   --   --   PLT 177   < > 119*   < > 106*   < > 268 252 254  --  335 413* 415*   < > = values in this interval not displayed.   Lab Results  Component Value Date   TSH 1.567 12/14/2020   Lab Results  Component Value Date   HGBA1C 7.3 (H) 12/17/2020   Lab Results  Component Value Date   CHOL 119 08/08/2020   HDL 42 08/08/2020   LDLCALC 56 08/08/2020   TRIG 105 08/08/2020   CHOLHDL 2.8 08/08/2020    Significant Diagnostic Results in last 30 days:  DG Wrist 2 Views Right  Result Date: 12/22/2020 CLINICAL DATA:  Tenderness with movement. EXAM: RIGHT WRIST - 2 VIEW COMPARISON:  None. FINDINGS: Radiocarpal joint appears within normal limits. There are degenerative changes the articulation between the scaphoid and the multangular bones and  at the first and second carpometacarpal articulations. No sign of  fracture. Regional arterial calcification incidentally noted. IMPRESSION: Radial side degenerative changes as described above which could certainly be painful. Electronically Signed   By: Nelson Chimes M.D.   On: 12/22/2020 13:51   DG Chest Port 1 View  Result Date: 12/31/2020 CLINICAL DATA:  Shortness of breath EXAM: PORTABLE CHEST 1 VIEW COMPARISON:  Chest x-ray dated December 30, 2020 FINDINGS: Cardiac and mediastinal contours are unchanged post median sternotomy. Slightly increased bilateral central opacities. No large pleural effusion or evidence of pneumothorax. Enteric tube partially visualized coursing below the diaphragm. IMPRESSION: Slightly increased bilateral interstitial opacities, likely due to worsening pulmonary edema. Electronically Signed   By: Yetta Glassman M.D.   On: 12/31/2020 08:11   DG Chest Port 1 View  Result Date: 12/30/2020 CLINICAL DATA:  Stroke, AFib, sepsis EXAM: PORTABLE CHEST 1 VIEW COMPARISON:  Radiograph 12/29/2020 FINDINGS: Feeding tube passes below diaphragm, tip excluded by collimation. Prior median sternotomy and valve replacement. Unchanged cardiomegaly. Diffuse interstitial opacities, similar to prior exam. No large pleural effusion or visible pneumothorax. Bones are unchanged. IMPRESSION: Unchanged cardiomegaly. Diffuse interstitial opacities consistent with pulmonary edema. Electronically Signed   By: Maurine Simmering M.D.   On: 12/30/2020 08:45   DG Chest Port 1 View  Result Date: 12/29/2020 CLINICAL DATA:  Shortness of breath EXAM: PORTABLE CHEST 1 VIEW COMPARISON:  12/28/2020 FINDINGS: Cardiomegaly status post median sternotomy with aortic and mitral valvular prosthesis. Mild, diffuse bilateral interstitial pulmonary opacity, similar compared to prior examination. No new airspace opacity. Partially imaged enteric feeding tube. IMPRESSION: Cardiomegaly. Mild, diffuse bilateral interstitial pulmonary opacity, similar compared to prior examination, consistent with  edema or infection. No new airspace opacity. Electronically Signed   By: Delanna Ahmadi M.D.   On: 12/29/2020 11:40   DG Chest Port 1 View  Result Date: 12/28/2020 CLINICAL DATA:  Shortness of breath, CHF EXAM: PORTABLE CHEST 1 VIEW COMPARISON:  Portable exam 0000 hours compared to 12/14/2020 FINDINGS: Feeding tube traverses esophagus into stomach. Enlargement of cardiac silhouette post median sternotomy, MVR, and AVR. Atherosclerotic calcification aorta. Minimal bibasilar atelectasis. Lungs otherwise clear. No infiltrate, pleural effusion, or pneumothorax. No acute osseous findings. IMPRESSION: Minimal bibasilar atelectasis. Enlargement of cardiac silhouette post MVR and AVR. Electronically Signed   By: Lavonia Dana M.D.   On: 12/28/2020 08:36   DG Abd Portable 1V  Result Date: 12/31/2020 CLINICAL DATA:  Ileus. EXAM: PORTABLE ABDOMEN - 1 VIEW COMPARISON:  December 30, 2020. FINDINGS: Distal tip of feeding tube is seen in expected position of distal stomach. No significant bowel dilatation is seen at this time. Phleboliths are noted in the pelvis. IMPRESSION: No significant abnormal bowel dilatation is seen at this time. Electronically Signed   By: Marijo Conception M.D.   On: 12/31/2020 08:13   DG Abd Portable 1V  Result Date: 12/30/2020 CLINICAL DATA:  Nausea and vomiting. EXAM: PORTABLE ABDOMEN - 1 VIEW COMPARISON:  Same-day abdominal radiograph at 0550 hours FINDINGS: Enteric tube tip overlies expected location of the gastric antrum. Multiple gas-filled dilated loops of colon, similar to earlier radiograph. Mild-to-moderate stool burden within the ascending colon. No definite dilated small bowel loops. No free intraperitoneal air given the limits of the supine image. Partially visualized right hip arthroplasty. IMPRESSION: Stable position of the enteric tube, overlying the expected location of the gastric antrum. Unchanged exam compared to earlier same day radiograph with persistent diffuse gaseous  dilatation of the colon. Electronically Signed  By: Ileana Roup M.D.   On: 12/30/2020 15:34   DG Abd Portable 1V  Result Date: 12/30/2020 CLINICAL DATA:  Nausea EXAM: PORTABLE ABDOMEN - 1 VIEW COMPARISON:  04/20/2020 FINDINGS: Feeding tube tip overlies the distal stomach. There is diffuse gaseous distension of the colon and moderate stool burden in the ascending colon and rectum. No clear small bowel dilation. Prior right total hip arthroplasty. Lumbar spine degenerative changes. No acute osseous abnormality. IMPRESSION: Diffuse gaseous distension of the colon with moderate stool burden in the ascending colon and rectum. Feeding tube tip overlies the distal stomach. Electronically Signed   By: Maurine Simmering M.D.   On: 12/30/2020 08:42   DG Swallowing Func-Speech Pathology  Result Date: 01/01/2021 Table formatting from the original result was not included. 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  External hemorrhoids without mention of complication   H/O aortic valve replacement   H/O mitral valve replacement   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  Lesion of ulnar nerve   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   Other and unspecified hyperlipidemia   Pre-diabetes   Previous back surgery 1978, jan 2007  Psychosexual dysfunction with inhibited sexual excitement   SOB (shortness of breath)   "with heavy exercise"  Stroke ([place]) 08/2013  "I WAS IN  AFIB AND THREW A CLOT, THE EFFECTS WERE TRANSITORY AND DIDNT LAST BUT FOR 30 MINUTES"   Thoracic aortic aneurysm  Past Surgical History: Past Surgical History: Procedure Laterality Date  AORTIC AND MITRAL VALVE REPLACEMENT    09/2004  CARDIOVERSION    3 times from 2004-2006  CARDIOVERSION N/A 09/26/2013  Procedure: CARDIOVERSION;  Surgeon: Larey Dresser, MD;  Location: [place];  Service: Cardiovascular;  Laterality: N/A;  CARDIOVERSION N/A 06/19/2014  Procedure: CARDIOVERSION;  Surgeon: Jerline Pain, MD;  Location: [place];  Service: Cardiovascular;  Laterality: N/A;  CARDIOVERSION N/A 04/24/2017  Procedure: CARDIOVERSION;  Surgeon: Larey Dresser, MD;  Location: [place];  Service: Cardiovascular;  Laterality: N/A;  CARDIOVERSION N/A 06/08/2017  Procedure: CARDIOVERSION;  Surgeon: Pixie Casino, MD;  Location: [facility] ENDOSCOPY;  Service: Cardiovascular;  Laterality: N/A;  CARDIOVERSION N/A 08/24/2017  Procedure: CARDIOVERSION;  Surgeon: Skeet Latch, MD;  Location: [facility] ENDOSCOPY;  Service: Cardiovascular;  Laterality: N/A;  CARDIOVERSION N/A 02/14/2018  Procedure: CARDIOVERSION;  Surgeon: Larey Dresser, MD;  Location: [facility] ENDOSCOPY;  Service: Cardiovascular;  Laterality: N/A;  COLONOSCOPY    CRANIOTOMY N/A 07/28/2020  Procedure: CRANIOTOMY FOR EVACUATION OF SUBDURAL HEMATOMA;  Surgeon: Eustace Coventry, MD;  Location: [place];  Service: Neurosurgery;  Laterality: N/A;  IR ANGIO VERTEBRAL SEL SUBCLAVIAN INNOMINATE UNI R MOD SED  08/07/2020  IR CT HEAD LTD  08/07/2020  IR PERCUTANEOUS ART THROMBECTOMY/INFUSION INTRACRANIAL INC DIAG ANGIO  08/07/2020  laminectomies    10/1975 and in 03/2005  RADIOLOGY WITH ANESTHESIA N/A 08/07/2020  Procedure: IR WITH ANESTHESIA;  Surgeon: Luanne Bras, MD;  Location: [facility]  OR;  Service: Radiology;  Laterality: N/A;  TEE WITHOUT CARDIOVERSION N/A 12/18/2020  Procedure: TRANSESOPHAGEAL ECHOCARDIOGRAM (TEE);  Surgeon: Larey Dresser, MD;  Location: [facility] ENDOSCOPY;  Service:  Cardiovascular;  Laterality: N/A;  [place] Right 04/03/2018  Procedure: TOTAL HIP ARTHROPLASTY ANTERIOR APPROACH;  Surgeon: Paralee Cancel, MD;  Location: [facility];  Service: Orthopedics;  Laterality: Right;  70 mins HPI: Pt is a 80 y.o. male who was admitted 10/10 for confusion/decreased mentation, and poor intake, Pt found to have enterococcus faecalis bacteremia, encephalopathy, small left cerebellar vermis CVA, constipation with mild illeus which resolved. 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 Pt has a hx of cognitive deficits and dysphagia, with most recent MBS on 09/02/20 and most recent diet recommendations for Dysphagia 1 solids and honey-thick liquids.  Per family, pt has since advanced to mechanical soft solid and NTL.  Cortrak placed 12/18/20.  Subjective: Pt was lethargic Assessment / Plan / Recommendation CHL IP CLINICAL IMPRESSIONS 01/01/2021 Clinical Impression Pt presents with oropharyngeal dysphagia characterized by reduced bolus cohesion, reduced pharyngeal constriction, reduced anterior laryngeal movement and a pharyngeal delay. Brief oral holding was noted, but with adequate bolus containment. Premature spillage and the pharyngeal delay facilitated inconsistent penetration (PAS 5) with larger boluses of thin liquids via straw; however, no other instances of penetration/aspiration were noted during the study. Prompted coughing was ineffective in expelling the penetrate. Trace residue was noted in the pyriform sinuses and on the posterior pharyngeal wall, but this cleared with a liquid wash and secondary swallows. The barium tablet (taken whole with thin liquids) became lodged in the vallecule and subsequently in mid  thoracic esophagus. However, transport to the esophagus was facilitated with a puree bolus, and esophageal clearance was achieved with a liquid wash. A dysphagia 3 diet with thin liquids is recommended at this time observance of swallowing precautions. SLP will continue to follow pt. SLP Visit Diagnosis Dysphagia, oropharyngeal phase (R13.12) Attention and concentration deficit following -- Frontal lobe and executive function deficit following -- Impact on safety and function Mild aspiration risk   [facility] IP TREATMENT RECOMMENDATION 01/01/2021 Treatment Recommendations Therapy as outlined in treatment plan below   Prognosis 01/01/2021 Prognosis for Safe Diet Advancement Fair Barriers to Reach Goals Cognitive deficits Barriers/Prognosis Comment -- [facility] IP DIET RECOMMENDATION 01/01/2021 SLP Diet Recommendations Dysphagia 3 (Mech soft) solids;Thin liquid Liquid Administration via Cup;No straw Medication Administration Crushed with puree Compensations Slow rate;Small sips/bites Postural Changes Seated upright at 90 degrees   [facility] IP OTHER RECOMMENDATIONS 01/01/2021 Recommended Consults -- Oral Care Recommendations Oral care BID Other Recommendations --   [facility] IP FOLLOW UP RECOMMENDATIONS 01/01/2021 Follow up Recommendations 24 hour supervision/assistance;Skilled Nursing facility   [facility] IP FREQUENCY AND DURATION 01/01/2021 Speech Therapy Frequency (ACUTE ONLY) min 2x/week Treatment Duration 2 weeks      [facility] IP ORAL PHASE 01/01/2021 Oral Phase Impaired Oral - Pudding Teaspoon -- Oral - Pudding Cup -- Oral - Honey Teaspoon -- Oral - Honey Cup -- Oral - Nectar Teaspoon -- Oral - Nectar Cup Holding of bolus Oral - Nectar Straw Holding of bolus Oral - Thin Teaspoon -- Oral - Thin Cup Holding of bolus Oral - Thin Straw Holding of bolus;Decreased bolus cohesion;Premature spillage Oral - Puree Holding of bolus Oral - Mech Soft --  Oral - Regular WFL Oral - Multi-Consistency -- Oral - Pill Holding of bolus Oral Phase - Comment --  [facility] IP  PHARYNGEAL PHASE 01/01/2021 Pharyngeal Phase Impaired Pharyngeal- Pudding Teaspoon -- Pharyngeal -- Pharyngeal- Pudding Cup -- Pharyngeal -- Pharyngeal- Honey Teaspoon -- Pharyngeal -- Pharyngeal- Honey Cup -- Pharyngeal -- Pharyngeal- Nectar Teaspoon -- Pharyngeal -- Pharyngeal- Nectar Cup Pharyngeal residue - pyriform;Reduced anterior laryngeal mobility Pharyngeal -- Pharyngeal- Nectar Straw Pharyngeal residue - pyriform;Reduced anterior laryngeal mobility Pharyngeal -- Pharyngeal- Thin Teaspoon -- Pharyngeal -- Pharyngeal- Thin Cup Pharyngeal residue - pyriform;Reduced anterior laryngeal mobility Pharyngeal -- Pharyngeal- Thin Straw Pharyngeal residue - pyriform;Reduced anterior laryngeal mobility;Penetration/Aspiration during swallow Pharyngeal Material enters airway, CONTACTS cords and not ejected out Pharyngeal- Puree Pharyngeal residue - pyriform;Reduced anterior laryngeal mobility Pharyngeal -- Pharyngeal- Mechanical Soft -- Pharyngeal -- Pharyngeal- Regular Pharyngeal residue - pyriform;Reduced anterior laryngeal mobility Pharyngeal -- Pharyngeal- Multi-consistency -- Pharyngeal -- Pharyngeal- Pill -- Pharyngeal -- Pharyngeal Comment --  [facility] IP CERVICAL ESOPHAGEAL PHASE 01/01/2021 Cervical Esophageal Phase WFL Pudding Teaspoon -- Pudding Cup -- Honey Teaspoon -- Honey Cup -- Nectar Teaspoon -- Nectar Cup -- Nectar Straw -- Thin Teaspoon -- Thin Cup -- Thin Straw -- Puree -- Mechanical Soft -- Regular -- Multi-consistency -- Pill -- Cervical Esophageal Comment -- Shanika I. Hardin Negus, [place], [place] Office number [phone] Pager [place] 01/01/2021, 9:36 AM              [place] EKG SITE RITE  Result Date: 01/01/2021 If Site Rite image not attached, placement could not be confirmed due to current cardiac rhythm.   Assessment/Plan  1. Hypoxia Likely due to pulmonary edema due to evidence on prior CXR Continue oxygen at 3 liters  Give one dose of lasix  20 mg with Kdur 10 meq, can't be aggressive in diuresis due to low oral intake.  Discussed with his wife if oral intake does not improve we would need to make a decision about a feeding tube but this is not favorable due to his multiple health issues and QOL.   2. SOB (shortness of breath) Will see cardiology today, May need repeat xray at their office if not we can do one here at [place].  Xopenex 0.63 mg q 6 prn sob/wheeze, duonebs are making him tremor more  3. 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 Tremors Needs apt with neuro, nurse to call.  ? If this is due to vimpat, ID did not think it was rigors.  Tremors occur even when he is not getting duonebs   5. Urinary rentention Prior hx of BPH on flomax and proscar PVR 437cc Encourage pt to void but if not able I and O cath, obtain UA C and S  Family/ staff Communication: discussed with his wife Herbert Pun and son Jeral Fruit   Labs/tests ordered:  CBC AND BMP are weekly  ESR and CRP every other week   Addendum: discussed tremor with Dr. [name] my epic messaging. He suggesting reducing the amantadine and seeing if this helps.

## 2021-01-21 ENCOUNTER — Encounter (HOSPITAL_COMMUNITY): Payer: Self-pay

## 2021-01-21 DIAGNOSIS — S065X3S Traumatic subdural hemorrhage with loss of consciousness of 1 hour to 5 hours 59 minutes, sequela: Secondary | ICD-10-CM | POA: Diagnosis not present

## 2021-01-21 DIAGNOSIS — R278 Other lack of coordination: Secondary | ICD-10-CM | POA: Diagnosis not present

## 2021-01-21 DIAGNOSIS — R41841 Cognitive communication deficit: Secondary | ICD-10-CM | POA: Diagnosis not present

## 2021-01-21 DIAGNOSIS — H538 Other visual disturbances: Secondary | ICD-10-CM | POA: Diagnosis not present

## 2021-01-21 DIAGNOSIS — R41840 Attention and concentration deficit: Secondary | ICD-10-CM | POA: Diagnosis not present

## 2021-01-21 DIAGNOSIS — I10 Essential (primary) hypertension: Secondary | ICD-10-CM | POA: Diagnosis not present

## 2021-01-21 DIAGNOSIS — I69391 Dysphagia following cerebral infarction: Secondary | ICD-10-CM | POA: Diagnosis not present

## 2021-01-21 DIAGNOSIS — M6389 Disorders of muscle in diseases classified elsewhere, multiple sites: Secondary | ICD-10-CM | POA: Diagnosis not present

## 2021-01-21 DIAGNOSIS — R2689 Other abnormalities of gait and mobility: Secondary | ICD-10-CM | POA: Diagnosis not present

## 2021-01-21 DIAGNOSIS — I5032 Chronic diastolic (congestive) heart failure: Secondary | ICD-10-CM | POA: Diagnosis not present

## 2021-01-21 DIAGNOSIS — J189 Pneumonia, unspecified organism: Secondary | ICD-10-CM | POA: Diagnosis not present

## 2021-01-21 DIAGNOSIS — I6601 Occlusion and stenosis of right middle cerebral artery: Secondary | ICD-10-CM | POA: Diagnosis not present

## 2021-01-21 DIAGNOSIS — M5459 Other low back pain: Secondary | ICD-10-CM | POA: Diagnosis not present

## 2021-01-21 DIAGNOSIS — R601 Generalized edema: Secondary | ICD-10-CM | POA: Diagnosis not present

## 2021-01-21 MED ORDER — AMANTADINE HCL 50 MG/5ML PO SOLN: 100.0000 mg | Freq: Two times a day (BID) | ORAL | Status: DC

## 2021-01-21 MED ORDER — LORAZEPAM 0.5 MG PO TABS: 0.2500 mg | ORAL_TABLET | Freq: Four times a day (QID) | ORAL | 0 refills | Status: DC | PRN

## 2021-01-21 NOTE — Addendum Note (Signed)
Addended by: Barnie Mort on: 01/21/2021 10:20 AM   Modules accepted: Orders

## 2021-01-22 ENCOUNTER — Telehealth (HOSPITAL_COMMUNITY): Payer: Self-pay | Admitting: Vascular Surgery

## 2021-01-22 DIAGNOSIS — R2689 Other abnormalities of gait and mobility: Secondary | ICD-10-CM | POA: Diagnosis not present

## 2021-01-22 DIAGNOSIS — R601 Generalized edema: Secondary | ICD-10-CM | POA: Diagnosis not present

## 2021-01-22 DIAGNOSIS — M5459 Other low back pain: Secondary | ICD-10-CM | POA: Diagnosis not present

## 2021-01-22 DIAGNOSIS — R41840 Attention and concentration deficit: Secondary | ICD-10-CM | POA: Diagnosis not present

## 2021-01-22 DIAGNOSIS — M6389 Disorders of muscle in diseases classified elsewhere, multiple sites: Secondary | ICD-10-CM | POA: Diagnosis not present

## 2021-01-22 DIAGNOSIS — G9341 Metabolic encephalopathy: Secondary | ICD-10-CM | POA: Diagnosis not present

## 2021-01-22 DIAGNOSIS — I10 Essential (primary) hypertension: Secondary | ICD-10-CM | POA: Diagnosis not present

## 2021-01-22 DIAGNOSIS — S065X3S Traumatic subdural hemorrhage with loss of consciousness of 1 hour to 5 hours 59 minutes, sequela: Secondary | ICD-10-CM | POA: Diagnosis not present

## 2021-01-22 DIAGNOSIS — I6601 Occlusion and stenosis of right middle cerebral artery: Secondary | ICD-10-CM | POA: Diagnosis not present

## 2021-01-22 DIAGNOSIS — R41841 Cognitive communication deficit: Secondary | ICD-10-CM | POA: Diagnosis not present

## 2021-01-22 DIAGNOSIS — A4189 Other specified sepsis: Secondary | ICD-10-CM | POA: Diagnosis not present

## 2021-01-22 DIAGNOSIS — I5032 Chronic diastolic (congestive) heart failure: Secondary | ICD-10-CM | POA: Diagnosis not present

## 2021-01-22 DIAGNOSIS — H538 Other visual disturbances: Secondary | ICD-10-CM | POA: Diagnosis not present

## 2021-01-22 DIAGNOSIS — I69391 Dysphagia following cerebral infarction: Secondary | ICD-10-CM | POA: Diagnosis not present

## 2021-01-22 DIAGNOSIS — R278 Other lack of coordination: Secondary | ICD-10-CM | POA: Diagnosis not present

## 2021-01-22 NOTE — Telephone Encounter (Signed)
Wife contacted and DCCV rescheduled.

## 2021-01-22 NOTE — Telephone Encounter (Signed)
Pt wife called to cancel cardioversion w/ [facility] 11/23 she would like to resch please advise

## 2021-01-23 ENCOUNTER — Encounter (HOSPITAL_COMMUNITY): Payer: Self-pay | Admitting: Emergency Medicine

## 2021-01-23 ENCOUNTER — Emergency Department (HOSPITAL_COMMUNITY): Payer: Medicare Other

## 2021-01-23 ENCOUNTER — Other Ambulatory Visit: Payer: Self-pay

## 2021-01-23 ENCOUNTER — Inpatient Hospital Stay (HOSPITAL_COMMUNITY)
Admission: EM | Admit: 2021-01-23 | Discharge: 2021-01-27 | DRG: 291 | Disposition: A | Discharge status: Skilled Nursing Facility | Payer: Medicare Other | Source: Skilled Nursing Facility | Attending: Internal Medicine | Admitting: Internal Medicine

## 2021-01-23 DIAGNOSIS — G9341 Metabolic encephalopathy: Secondary | ICD-10-CM | POA: Diagnosis present

## 2021-01-23 DIAGNOSIS — I5031 Acute diastolic (congestive) heart failure: Secondary | ICD-10-CM | POA: Diagnosis not present

## 2021-01-23 DIAGNOSIS — I739 Peripheral vascular disease, unspecified: Secondary | ICD-10-CM | POA: Diagnosis not present

## 2021-01-23 DIAGNOSIS — I611 Nontraumatic intracerebral hemorrhage in hemisphere, cortical: Secondary | ICD-10-CM | POA: Diagnosis not present

## 2021-01-23 DIAGNOSIS — R7881 Bacteremia: Secondary | ICD-10-CM | POA: Diagnosis not present

## 2021-01-23 DIAGNOSIS — Y831 Surgical operation with implant of artificial internal device as the cause of abnormal reaction of the patient, or of later complication, without mention of misadventure at the time of the procedure: Secondary | ICD-10-CM | POA: Diagnosis present

## 2021-01-23 DIAGNOSIS — Z833 Family history of diabetes mellitus: Secondary | ICD-10-CM

## 2021-01-23 DIAGNOSIS — G40909 Epilepsy, unspecified, not intractable, without status epilepticus: Secondary | ICD-10-CM | POA: Diagnosis present

## 2021-01-23 DIAGNOSIS — I484 Atypical atrial flutter: Secondary | ICD-10-CM | POA: Diagnosis present

## 2021-01-23 DIAGNOSIS — E876 Hypokalemia: Secondary | ICD-10-CM | POA: Diagnosis present

## 2021-01-23 DIAGNOSIS — I4819 Other persistent atrial fibrillation: Secondary | ICD-10-CM | POA: Diagnosis not present

## 2021-01-23 DIAGNOSIS — I69391 Dysphagia following cerebral infarction: Secondary | ICD-10-CM

## 2021-01-23 DIAGNOSIS — N400 Enlarged prostate without lower urinary tract symptoms: Secondary | ICD-10-CM | POA: Diagnosis present

## 2021-01-23 DIAGNOSIS — K5909 Other constipation: Secondary | ICD-10-CM | POA: Diagnosis present

## 2021-01-23 DIAGNOSIS — I509 Heart failure, unspecified: Secondary | ICD-10-CM

## 2021-01-23 DIAGNOSIS — I69354 Hemiplegia and hemiparesis following cerebral infarction affecting left non-dominant side: Secondary | ICD-10-CM

## 2021-01-23 DIAGNOSIS — R1310 Dysphagia, unspecified: Secondary | ICD-10-CM | POA: Diagnosis present

## 2021-01-23 DIAGNOSIS — Z7901 Long term (current) use of anticoagulants: Secondary | ICD-10-CM

## 2021-01-23 DIAGNOSIS — K5790 Diverticulosis of intestine, part unspecified, without perforation or abscess without bleeding: Secondary | ICD-10-CM | POA: Diagnosis present

## 2021-01-23 DIAGNOSIS — I517 Cardiomegaly: Secondary | ICD-10-CM | POA: Diagnosis not present

## 2021-01-23 DIAGNOSIS — R4182 Altered mental status, unspecified: Secondary | ICD-10-CM | POA: Diagnosis not present

## 2021-01-23 DIAGNOSIS — Z9889 Other specified postprocedural states: Secondary | ICD-10-CM | POA: Diagnosis not present

## 2021-01-23 DIAGNOSIS — J9601 Acute respiratory failure with hypoxia: Secondary | ICD-10-CM | POA: Diagnosis not present

## 2021-01-23 DIAGNOSIS — R7303 Prediabetes: Secondary | ICD-10-CM | POA: Diagnosis present

## 2021-01-23 DIAGNOSIS — R609 Edema, unspecified: Secondary | ICD-10-CM | POA: Diagnosis not present

## 2021-01-23 DIAGNOSIS — I441 Atrioventricular block, second degree: Secondary | ICD-10-CM | POA: Diagnosis present

## 2021-01-23 DIAGNOSIS — I7121 Aneurysm of the ascending aorta, without rupture: Secondary | ICD-10-CM | POA: Diagnosis present

## 2021-01-23 DIAGNOSIS — I110 Hypertensive heart disease with heart failure: Principal | ICD-10-CM | POA: Diagnosis present

## 2021-01-23 DIAGNOSIS — I69320 Aphasia following cerebral infarction: Secondary | ICD-10-CM

## 2021-01-23 DIAGNOSIS — Z7401 Bed confinement status: Secondary | ICD-10-CM

## 2021-01-23 DIAGNOSIS — Z20822 Contact with and (suspected) exposure to covid-19: Secondary | ICD-10-CM | POA: Diagnosis not present

## 2021-01-23 DIAGNOSIS — B952 Enterococcus as the cause of diseases classified elsewhere: Secondary | ICD-10-CM | POA: Diagnosis present

## 2021-01-23 DIAGNOSIS — F419 Anxiety disorder, unspecified: Secondary | ICD-10-CM | POA: Diagnosis present

## 2021-01-23 DIAGNOSIS — I5033 Acute on chronic diastolic (congestive) heart failure: Secondary | ICD-10-CM

## 2021-01-23 DIAGNOSIS — Z7189 Other specified counseling: Secondary | ICD-10-CM | POA: Diagnosis not present

## 2021-01-23 DIAGNOSIS — I6529 Occlusion and stenosis of unspecified carotid artery: Secondary | ICD-10-CM | POA: Diagnosis not present

## 2021-01-23 DIAGNOSIS — Z8249 Family history of ischemic heart disease and other diseases of the circulatory system: Secondary | ICD-10-CM

## 2021-01-23 DIAGNOSIS — R4701 Aphasia: Secondary | ICD-10-CM | POA: Diagnosis present

## 2021-01-23 DIAGNOSIS — R0902 Hypoxemia: Secondary | ICD-10-CM | POA: Diagnosis not present

## 2021-01-23 DIAGNOSIS — I330 Acute and subacute infective endocarditis: Secondary | ICD-10-CM | POA: Diagnosis not present

## 2021-01-23 DIAGNOSIS — R0602 Shortness of breath: Secondary | ICD-10-CM | POA: Diagnosis not present

## 2021-01-23 DIAGNOSIS — T826XXA Infection and inflammatory reaction due to cardiac valve prosthesis, initial encounter: Secondary | ICD-10-CM | POA: Diagnosis present

## 2021-01-23 DIAGNOSIS — Z8673 Personal history of transient ischemic attack (TIA), and cerebral infarction without residual deficits: Secondary | ICD-10-CM | POA: Diagnosis not present

## 2021-01-23 DIAGNOSIS — Z79899 Other long term (current) drug therapy: Secondary | ICD-10-CM

## 2021-01-23 DIAGNOSIS — G9389 Other specified disorders of brain: Secondary | ICD-10-CM | POA: Diagnosis not present

## 2021-01-23 DIAGNOSIS — R0600 Dyspnea, unspecified: Secondary | ICD-10-CM

## 2021-01-23 DIAGNOSIS — R404 Transient alteration of awareness: Secondary | ICD-10-CM | POA: Diagnosis not present

## 2021-01-23 DIAGNOSIS — Z66 Do not resuscitate: Secondary | ICD-10-CM | POA: Diagnosis present

## 2021-01-23 DIAGNOSIS — Z888 Allergy status to other drugs, medicaments and biological substances status: Secondary | ICD-10-CM

## 2021-01-23 DIAGNOSIS — E46 Unspecified protein-calorie malnutrition: Secondary | ICD-10-CM | POA: Diagnosis not present

## 2021-01-23 DIAGNOSIS — I10 Essential (primary) hypertension: Secondary | ICD-10-CM | POA: Diagnosis not present

## 2021-01-23 DIAGNOSIS — I059 Rheumatic mitral valve disease, unspecified: Secondary | ICD-10-CM | POA: Diagnosis not present

## 2021-01-23 DIAGNOSIS — R569 Unspecified convulsions: Secondary | ICD-10-CM | POA: Diagnosis not present

## 2021-01-23 DIAGNOSIS — E785 Hyperlipidemia, unspecified: Secondary | ICD-10-CM | POA: Diagnosis present

## 2021-01-23 DIAGNOSIS — R000 Tachycardia, unspecified: Secondary | ICD-10-CM | POA: Diagnosis not present

## 2021-01-23 LAB — URINALYSIS, ROUTINE W REFLEX MICROSCOPIC
Bilirubin Urine: NEGATIVE
Glucose, UA: NEGATIVE mg/dL
Hgb urine dipstick: NEGATIVE
Ketones, ur: NEGATIVE mg/dL
Leukocytes,Ua: NEGATIVE
Nitrite: NEGATIVE
Protein, ur: NEGATIVE mg/dL
Specific Gravity, Urine: 1.009 (ref 1.005–1.030)
pH: 7 (ref 5.0–8.0)

## 2021-01-23 LAB — RESP PANEL BY RT-PCR (FLU A&B, COVID) ARPGX2
Influenza A by PCR: NEGATIVE
Influenza B by PCR: NEGATIVE
SARS Coronavirus 2 by RT PCR: NEGATIVE

## 2021-01-23 LAB — COMPREHENSIVE METABOLIC PANEL
ALT: 34 U/L (ref 0–44)
AST: 37 U/L (ref 15–41)
Albumin: 2.4 g/dL — ABNORMAL LOW (ref 3.5–5.0)
Alkaline Phosphatase: 107 U/L (ref 38–126)
Anion gap: 9 (ref 5–15)
BUN: 12 mg/dL (ref 8–23)
CO2: 27 mmol/L (ref 22–32)
Calcium: 8.4 mg/dL — ABNORMAL LOW (ref 8.9–10.3)
Chloride: 105 mmol/L (ref 98–111)
Creatinine, Ser: 0.93 mg/dL (ref 0.61–1.24)
GFR, Estimated: >60 mL/min (ref >60)
Glucose, Bld: 120 mg/dL — ABNORMAL HIGH (ref 70–99)
Potassium: 3 mmol/L — ABNORMAL LOW (ref 3.5–5.1)
Sodium: 141 mmol/L (ref 135–145)
Total Bilirubin: 0.7 mg/dL (ref 0.3–1.2)
Total Protein: 6.9 g/dL (ref 6.5–8.1)

## 2021-01-23 LAB — CBC
HCT: 26.5 % — ABNORMAL LOW (ref 39.0–52.0)
Hemoglobin: 7.7 g/dL — ABNORMAL LOW (ref 13.0–17.0)
MCH: 26.6 pg (ref 26.0–34.0)
MCHC: 29.1 g/dL — ABNORMAL LOW (ref 30.0–36.0)
MCV: 91.7 fL (ref 80.0–100.0)
Platelets: 448 10*3/uL — ABNORMAL HIGH (ref 150–400)
RBC: 2.89 MIL/uL — ABNORMAL LOW (ref 4.22–5.81)
RDW: 16.9 % — ABNORMAL HIGH (ref 11.5–15.5)
WBC: 10.6 10*3/uL — ABNORMAL HIGH (ref 4.0–10.5)
nRBC: 0 % (ref 0.0–0.2)

## 2021-01-23 LAB — PROTIME-INR
INR: 1.5 — ABNORMAL HIGH (ref 0.8–1.2)
Prothrombin Time: 18.2 seconds — ABNORMAL HIGH (ref 11.4–15.2)

## 2021-01-23 LAB — BRAIN NATRIURETIC PEPTIDE: B Natriuretic Peptide: 223.5 pg/mL — ABNORMAL HIGH (ref 0.0–100.0)

## 2021-01-23 MED ORDER — APIXABAN 5 MG PO TABS
5.0000 mg | ORAL_TABLET | Freq: Two times a day (BID) | ORAL | Status: DC
  Administered 2021-01-23 – 2021-01-27 (×8): 5 mg via ORAL
  Filled 2021-01-23 (×9): qty 1

## 2021-01-23 MED ORDER — AMPICILLIN SODIUM 2 G IJ SOLR
2.0000 g | INTRAMUSCULAR | Status: DC
  Administered 2021-01-23: 2 g via INTRAVENOUS
  Filled 2021-01-23 (×4): qty 2000

## 2021-01-23 MED ORDER — LORATADINE 10 MG PO TABS
10.0000 mg | ORAL_TABLET | Freq: Every day | ORAL | Status: DC
  Administered 2021-01-23 – 2021-01-26 (×4): 10 mg via ORAL
  Filled 2021-01-23 (×4): qty 1

## 2021-01-23 MED ORDER — ACETAMINOPHEN 650 MG RE SUPP: 650.0000 mg | Freq: Four times a day (QID) | RECTAL | Status: DC | PRN

## 2021-01-23 MED ORDER — POLYETHYLENE GLYCOL 3350 17 G PO PACK
17.0000 g | PACK | Freq: Every day | ORAL | Status: DC | PRN
  Administered 2021-01-24 – 2021-01-26 (×3): 17 g via ORAL
  Filled 2021-01-23 (×3): qty 1

## 2021-01-23 MED ORDER — POTASSIUM CHLORIDE 20 MEQ PO PACK
60.0000 meq | PACK | Freq: Every day | ORAL | Status: DC
  Administered 2021-01-23: 60 meq via ORAL
  Filled 2021-01-23: qty 3

## 2021-01-23 MED ORDER — POTASSIUM CHLORIDE 20 MEQ PO PACK
40.0000 meq | PACK | Freq: Once | ORAL | Status: AC
  Administered 2021-01-24: 40 meq via ORAL
  Filled 2021-01-23: qty 2

## 2021-01-23 MED ORDER — FUROSEMIDE 10 MG/ML IJ SOLN
40.0000 mg | Freq: Once | INTRAMUSCULAR | Status: AC
  Administered 2021-01-23: 40 mg via INTRAVENOUS
  Filled 2021-01-23: qty 4

## 2021-01-23 MED ORDER — ADULT MULTIVITAMIN W/MINERALS CH
1.0000 | ORAL_TABLET | Freq: Every day | ORAL | Status: DC
  Administered 2021-01-24 – 2021-01-27 (×4): 1 via ORAL
  Filled 2021-01-23 (×4): qty 1

## 2021-01-23 MED ORDER — TAMSULOSIN HCL 0.4 MG PO CAPS
0.4000 mg | ORAL_CAPSULE | Freq: Every day | ORAL | Status: DC
  Administered 2021-01-23 – 2021-01-27 (×5): 0.4 mg via ORAL
  Filled 2021-01-23 (×5): qty 1

## 2021-01-23 MED ORDER — FUROSEMIDE 10 MG/ML IJ SOLN
40.0000 mg | Freq: Two times a day (BID) | INTRAMUSCULAR | Status: DC
  Administered 2021-01-24 – 2021-01-26 (×5): 40 mg via INTRAVENOUS
  Filled 2021-01-23 (×5): qty 4

## 2021-01-23 MED ORDER — ACETAMINOPHEN 325 MG PO TABS: 650.0000 mg | ORAL_TABLET | Freq: Four times a day (QID) | ORAL | Status: DC | PRN

## 2021-01-23 MED ORDER — SODIUM CHLORIDE 0.9 % IV SOLN
2.0000 g | Freq: Two times a day (BID) | INTRAVENOUS | Status: DC
  Administered 2021-01-23 – 2021-01-27 (×8): 2 g via INTRAVENOUS
  Filled 2021-01-23 (×10): qty 20

## 2021-01-23 MED ORDER — MAGNESIUM GLUCONATE 500 MG PO TABS
500.0000 mg | ORAL_TABLET | Freq: Every day | ORAL | Status: DC
  Administered 2021-01-24 – 2021-01-27 (×4): 500 mg via ORAL
  Filled 2021-01-23 (×4): qty 1

## 2021-01-23 MED ORDER — FINASTERIDE 5 MG PO TABS
5.0000 mg | ORAL_TABLET | Freq: Every day | ORAL | Status: DC
  Administered 2021-01-24 – 2021-01-27 (×4): 5 mg via ORAL
  Filled 2021-01-23 (×4): qty 1

## 2021-01-23 MED ORDER — SODIUM CHLORIDE 0.9 % IV SOLN
2.0000 g | INTRAVENOUS | Status: DC
  Administered 2021-01-24 – 2021-01-27 (×22): 2 g via INTRAVENOUS
  Filled 2021-01-23 (×24): qty 2000

## 2021-01-23 MED ORDER — AMANTADINE HCL 50 MG/5ML PO SOLN
100.0000 mg | Freq: Two times a day (BID) | ORAL | Status: DC
  Administered 2021-01-24 – 2021-01-27 (×6): 100 mg via ORAL
  Filled 2021-01-23 (×8): qty 10

## 2021-01-23 MED ORDER — POLYVINYL ALCOHOL 1.4 % OP SOLN
2.0000 [drp] | Freq: Every day | OPHTHALMIC | Status: DC
  Administered 2021-01-26: 2 [drp] via OPHTHALMIC
  Filled 2021-01-23: qty 15

## 2021-01-23 MED ORDER — AMLODIPINE BESYLATE 10 MG PO TABS
10.0000 mg | ORAL_TABLET | Freq: Every day | ORAL | Status: DC
  Administered 2021-01-24 – 2021-01-27 (×4): 10 mg via ORAL
  Filled 2021-01-23 (×4): qty 1

## 2021-01-23 MED ORDER — ATORVASTATIN CALCIUM 80 MG PO TABS
80.0000 mg | ORAL_TABLET | Freq: Every day | ORAL | Status: DC
  Administered 2021-01-24 – 2021-01-27 (×4): 80 mg via ORAL
  Filled 2021-01-23 (×4): qty 1

## 2021-01-23 MED ORDER — DOCUSATE SODIUM 100 MG PO CAPS
100.0000 mg | ORAL_CAPSULE | Freq: Two times a day (BID) | ORAL | Status: DC
  Administered 2021-01-23 – 2021-01-24 (×3): 100 mg via ORAL
  Filled 2021-01-23 (×3): qty 1

## 2021-01-23 MED ORDER — POLYETHYL GLYCOL-PROPYL GLYCOL 0.4-0.3 % OP SOLN: 2.0000 [drp] | Freq: Every day | OPHTHALMIC | Status: DC

## 2021-01-23 MED ORDER — LACOSAMIDE 50 MG PO TABS
50.0000 mg | ORAL_TABLET | Freq: Two times a day (BID) | ORAL | Status: DC
  Administered 2021-01-23 – 2021-01-27 (×8): 50 mg via ORAL
  Filled 2021-01-23 (×8): qty 1

## 2021-01-23 MED ORDER — FERROUS SULFATE 220 (44 FE) MG/5ML PO ELIX: 240.0000 mg | ORAL_SOLUTION | ORAL | Status: DC

## 2021-01-23 NOTE — ED Triage Notes (Signed)
Pt arrives from [facility] via [facility] reporting 20 lb wt gain since early Oct.  EMS reports 87% RA, came up to 94% 2L. Accessory muscle use noted on arrival.  Per EMS pt at baseline AOx4, hx CVA. EMS reports pt on IV abx for endocarditis and UTI. MD at bedside.

## 2021-01-23 NOTE — Progress Notes (Signed)
Pharmacy Antibiotic Note  [name] [name] is a 80 y.o. male admitted on 01/23/2021 with  endocarditis .  Pharmacy has been consulted for ampicillin dosing.  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  Discharged to SNF with PICC on Rocephin and ampicillin. Plan at that time was IV antibiotics until 01/28/2021, then amoxicillin 500 mg twice daily indefinitely. At time of pharmacy consult, patient had missed two doses of ampicillin per EDP.  SCr 0.93 - at baseline WBC 10.6; afeb  Plan: Ampicillin 2g q4h Ceftriaxone per MD Trend WBC, Fever, Renal function, & Clinical course F/u cultures, clinical course, WBC, fever F/u ID recommendations  Height: 5\' 7"  (170.2 cm) Weight: 83.8 kg (184 lb 11.9 oz) IBW/kg (Calculated) : 66.1  Temp (24hrs), Avg:98.4 F (36.9 C), Min:98.4 F (36.9 C), Max:98.4 F (36.9 C)  Recent Labs  Lab 01/18/21 0000 01/20/21 1631 01/23/21 1355  WBC 10.6  10.6 15.1* 10.6*  CREATININE 0.7 0.92 0.93    Estimated Creatinine Clearance: 67.8 mL/min (by C-G formula based on SCr of 0.93 mg/dL).    Allergies  Allergen Reactions   Naproxen Hives   No [employer] Food Allergies Other (See Comments)    Scallops - distress, nausea and vomitting    Antimicrobials this admission: Ampicillin 10/11 >> (11/24) CTX 10/14 >> (11/24)  Microbiology results: Pending  Thank you for allowing pharmacy to be a part of this patient's care.  Lorelei Pont, PharmD, BCPS 01/23/2021 7:28 PM ED Clinical Pharmacist -  [phone]

## 2021-01-23 NOTE — ED Provider Notes (Signed)
[facility] EMERGENCY DEPARTMENT Provider Note   CSN: [id number] Arrival date & time: 01/23/21  1343     History Chief Complaint  Patient presents with   Shortness of Breath    [name] [name] is a 80 y.o. male.  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 fib, last history of a flutter with multiple cardioversions, on chronic anticoagulation TBI history of subdural, history of craniotomy, hospitalized October 10 to October 31 acute metabolic encephalopathy due to hypernatremia and Enterococcus or bacteria of prosthetic valve.  Blood cultures grew Enterococcus bacillus and urine grew staph.  Blood cultures on October 17 were negative.  He has a PICC line and has will continue to receive Rocephin and ampicillin with a stop date of 01/28/2021.  Who [facility] with support of weight gain and hypoxia.  EMS reports that they states to them that he has gained 20 pounds since his discharge.  Review of records from nurse practitioner did that he was seen days ago for acute hypoxia with reports that his sats were 83% he was now having a new oxygen requirement of 3 L.  He is reported to have edema around the abdomen and thigh area with intermittent shortness of best at rest in bed and difficulty lying flat. Review of cardiology note also on November 16 noted history of rheumatic fever and MR status post bioprosthetic AVR and MVR status post maze procedure on October 13 he had palpitations found to have atypical atrial flutter.  He has had multiple procedures and versions as well as multiple EKGs.  Most recent cardiology consult noted to be January 20, 2021, 3  days ago.  At that time he was assessed with chronic diastolic heart failure with most recent echo on 1022 EF of 65 to 70%.  He reported weight up 4 pounds.  He appeared to be at least mildly volume overloaded per cardiology consult.  This was felt to be be due likely to atrial fibrillation.  Started on torsemide 20 mg daily and 10 mEq of potassium.  Chest x-Dajanay Northrup of 1111 showed infiltrate versus CHF repeat chest x-Raiven Belizaire was ordered Baseline data shows that he is bedbound with left hemiparesis and minimal communication.  Reported that mental status was worse Valvular heart disease with enterococcal bacteremia.  He is to be continued on his Eliquis.  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    Other and unspecified hyperlipidemia    Pre-diabetes    Previous back surgery 1978, jan 2007   Psychosexual dysfunction with inhibited sexual excitement    SOB (shortness of breath)    "  with heavy exercise"   Stroke ([place]) 08/2013   "I WAS IN AFIB AND THREW A CLOT, THE EFFECTS WERE TRANSITORY AND DIDNT LAST BUT FOR 30 MINUTES"    Thoracic aortic aneurysm     Patient Active Problem List   Diagnosis Date Noted   CHF exacerbation ([place]) 01/23/2021   Tremor of unknown origin 01/18/2021   Prosthetic valve endocarditis (HCC)    Protein-calorie malnutrition, severe 12/18/2020   S/P MVR (mitral valve repair)    History of CVA in adulthood    Bacteremia 12/15/2020   History of aortic valve replacement    History of mitral valve  replacement    Pressure injury of skin 12/14/2020   Hypernatremia 12/14/2020   Somnolence    Diabetes mellitus type II, controlled ([place]) 10/15/2020   Oropharyngeal dysphagia    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[place])    Acute ischemic right MCA stroke ([place]) 08/15/2020   CVA (cerebral vascular accident) ([place]) 08/07/2020   Middle cerebral artery embolism, 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   Subdural hemorrhage following injury 07/31/2020   S/P craniotomy 07/28/2020   Subdural hematoma 07/27/2020   Vertigo 08/30/2018   S/P right THA, 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   Long term (current) use of anticoagulants 01/05/2018   History of food allergy 12/11/2017   Allergic reaction 12/11/2017   Emesis 12/11/2017   Visit for monitoring Tikosyn therapy 06/06/2017   Internal hemorrhoid, bleeding 05/19/2016   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   Aortic valve disorder 07/09/2015   Long term current use of anticoagulant therapy 01/19/2015   Atrial fibrillation, chronic ([place]) 01/20/2014   Elevated PSA 12/23/2013   Prediabetes 12/19/2013   Benign prostatic hyperplasia with urinary obstruction 10/29/2013   left frontal cerebral infarct secondary to afib 08/05/2013   Atrial flutter ([place]) 05/17/2013   Essential hypertension 06/19/2012   Routine health maintenance 04/26/2011   Persistent atrial fibrillation ([place]) 07/10/2008   Hyperlipidemia with target LDL less than 130 07/07/2008   History of colonic polyps 06/16/2008   ERECTILE DYSFUNCTION 09/25/2007    Past Surgical History:  Procedure Laterality Date   AORTIC AND MITRAL VALVE REPLACEMENT     09/2004   CARDIOVERSION     3 times from 2004-2006   CARDIOVERSION N/A 09/26/2013   Procedure: CARDIOVERSION;  Surgeon: Larey Dresser, MD;  Location: [place];  Service: Cardiovascular;  Laterality: N/A;   CARDIOVERSION N/A 06/19/2014   Procedure: CARDIOVERSION;  Surgeon:  Jerline Pain, MD;  Location: [place];  Service: Cardiovascular;  Laterality: N/A;   CARDIOVERSION N/A 04/24/2017   Procedure: CARDIOVERSION;  Surgeon: Larey Dresser, MD;  Location: [place];  Service: Cardiovascular;  Laterality: N/A;   CARDIOVERSION N/A 06/08/2017   Procedure: CARDIOVERSION;  Surgeon: Pixie Casino, MD;  Location: [facility] ENDOSCOPY;  Service: Cardiovascular;  Laterality: N/A;   CARDIOVERSION N/A 08/24/2017   Procedure: CARDIOVERSION;  Surgeon: Skeet Latch, MD;  Location: [place];  Service: Cardiovascular;  Laterality: N/A;   CARDIOVERSION N/A 02/14/2018   Procedure: CARDIOVERSION;  Surgeon: Larey Dresser, MD;  Location: [facility] ENDOSCOPY;  Service: Cardiovascular;  Laterality: N/A;   COLONOSCOPY     CRANIOTOMY N/A 07/28/2020   Procedure: CRANIOTOMY FOR EVACUATION OF SUBDURAL HEMATOMA;  Surgeon: Eustace Hartje, MD;  Location: [place];  Service: Neurosurgery;  Laterality: N/A;   IR ANGIO VERTEBRAL SEL SUBCLAVIAN INNOMINATE UNI R MOD SED  08/07/2020   IR CT  HEAD LTD  08/07/2020   IR PERCUTANEOUS ART THROMBECTOMY/INFUSION INTRACRANIAL INC DIAG ANGIO  08/07/2020   laminectomies     10/1975 and in 03/2005   RADIOLOGY WITH ANESTHESIA N/A 08/07/2020   Procedure: IR WITH ANESTHESIA;  Surgeon: Luanne Bras, MD;  Location: [place];  Service: Radiology;  Laterality: N/A;   TEE WITHOUT CARDIOVERSION N/A 12/18/2020   Procedure: TRANSESOPHAGEAL ECHOCARDIOGRAM (TEE);  Surgeon: Larey Dresser, MD;  Location[facility] ENDOSCOPY;  Service: Cardiovascular;  Laterality: N/A;   [place] Right 04/03/2018   Procedure: TOTAL HIP ARTHROPLASTY ANTERIOR APPROACH;  Surgeon: Paralee Cancel, MD;  Location: [facility];  Service: Orthopedics;  Laterality: Right;  70 mins       Family History  Problem Relation Age of Onset   Macular degeneration Mother        macular degeneration   Cancer Father        intestinal/GI   Diabetes Paternal Aunt    Heart attack  Maternal Grandfather    Diabetes Brother     Social History   Tobacco Use   Smoking status: Never   Smokeless tobacco: Never  Vaping Use   Vaping Use: Never used  Substance Use Topics   Alcohol use: Never    Comment: wine occasionally   Drug use: Never    Home Medications Prior to Admission medications   Medication Sig Start Date End Date Taking? Authorizing Provider  acetaminophen (TYLENOL) 325 MG tablet Take 650 mg by mouth every 4 (four) hours as needed for fever or mild pain.   Yes [provider]  amantadine (SYMMETREL) 50 MG/5ML solution Take 10 mLs (100 mg total) by mouth 2 (two) times daily. 8 AM and 2 PM 01/21/21  Yes Wert, Margreta Journey, NP  amLODipine (NORVASC) 10 MG tablet Take 1 tablet (10 mg total) by mouth daily. 01/04/21  Yes Thurnell Lose, MD  ampicillin IVPB Inject 12 g into the vein daily. As a continuous infusion. Indication:  Enterococcus endocarditis  First Dose: Yes Last Day of Therapy:  01/28/21 Labs - Once weekly:  CBC/D and BMP, Labs - Every other week:  ESR and CRP Method of administration: Ambulatory Pump (Continuous Infusion) Method of administration may be changed at the discretion of home infusion pharmacist based upon assessment of the patient and/or caregiver's ability to self-administer the medication ordered. Patient taking differently: Inject 2 g into the vein See admin instructions. 63ml/hour over 4 hours. 0800 and 2000 doses should be run at 68ml/hour over 3 hours (in order to give Rocephin at 1100/2300. Use SASH method of flushing after rocephin before resuming Ampicillin 01/04/21 02/07/21 Yes Thurnell Lose, MD  apixaban (ELIQUIS) 5 MG TABS tablet Take 1 tablet (5 mg total) by mouth 2 (two) times daily. 01/04/21  Yes Thurnell Lose, MD  Artificial Saliva (BIOTENE MOISTURIZING MOUTH MT) Use as directed 2 sprays in the mouth or throat in the morning, at noon, and at bedtime.   Yes [provider]  atorvastatin (LIPITOR) 80  MG tablet Take 1 tablet (80 mg total) by mouth daily. 01/04/21  Yes Thurnell Lose, MD  bisacodyl (DULCOLAX) 10 MG suppository Place 1 suppository (10 mg total) rectally daily as needed for moderate constipation. 01/04/21  Yes Thurnell Lose, MD  cefTRIAXone (ROCEPHIN) IVPB Inject 2 g into the vein every 12 (twelve) hours. Indication:  Enterococcal endocarditis  First Dose: Yes Last Day of Therapy:  01/28/21 Labs - Once weekly:  CBC/D and BMP, Labs - Every other week:  ESR and CRP Method of administration: IV Push Method of administration may be changed at the discretion of home infusion pharmacist based upon assessment of the patient and/or caregiver's ability to self-administer the medication ordered. Patient taking differently: Inject 2 g into the vein every 12 (twelve) hours. 19ml/hour over 30 minutes. 01/04/21 02/07/21 Yes Thurnell Lose, MD  clobetasol cream (TEMOVATE) 4.40 % Apply 1 application topically daily as needed (for skin irritation).  05/20/14  Yes [provider]  docusate sodium (COLACE) 100 MG capsule Take 1 capsule (100 mg total) by mouth 2 (two) times daily. 01/04/21 02/03/21 Yes Thurnell Lose, MD  dofetilide (TIKOSYN) 125 MCG capsule Take 3 capsules (375 mcg total) by mouth 2 (two) times daily. 01/04/21  Yes Thurnell Lose, MD  famotidine (PEPCID) 20 MG tablet Take 20 mg by mouth at bedtime as needed for indigestion.   Yes [provider]  ferrous sulfate 220 (44 Fe) MG/5ML solution Take 240 mg by mouth every Monday, Wednesday, and Friday.   Yes [provider]  finasteride (PROSCAR) 5 MG tablet Take 1 tablet (5 mg total) by mouth daily. 01/04/21  Yes Thurnell Lose, MD  hydrocortisone cream 1 % Apply topically 2 (two) times daily as needed for itching (rash). 09/08/20  Yes Love, Ivan Anchors, PA-C  lacosamide (VIMPAT) 50 MG TABS tablet Take 1 tablet (50 mg total) by mouth 2 (two) times daily. 01/04/21  Yes Thurnell Lose, MD   levalbuterol Penne Lash) 0.63 MG/3ML nebulizer solution Take 0.63 mg by nebulization every 6 (six) hours as needed for wheezing or shortness of breath.   Yes [provider]  loratadine (CLARITIN) 10 MG tablet Take 10 mg by mouth at bedtime.   Yes [provider]  LORazepam (ATIVAN) 0.5 MG tablet Take 0.5 tablets (0.25 mg total) by mouth every 6 (six) hours as needed for up to 14 days for anxiety (x 72 hours). 01/21/21 02/04/21 Yes Wert, Margreta Journey, NP  magnesium gluconate (MAGONATE) 500 MG tablet Take 1 tablet (500 mg total) by mouth daily. 01/04/21  Yes Thurnell Lose, MD  Multiple Vitamin (MULTIVITAMIN WITH MINERALS) TABS tablet Take 1 tablet by mouth daily. 01/05/21  Yes Thurnell Lose, MD  Polyethyl Glycol-Propyl Glycol (SYSTANE) 0.4-0.3 % SOLN Place 2 drops into both eyes daily.   Yes [provider]  polyethylene glycol (MIRALAX / GLYCOLAX) 17 g packet Take 17 g by mouth daily as needed for mild constipation.   Yes [provider]  potassium chloride SA (KLOR-CON) 10 MEQ tablet Take 1 tablet (10 mEq total) by mouth daily. 01/20/21  Yes Milford, Maricela Bo, FNP  tamsulosin (FLOMAX) 0.4 MG CAPS capsule Take 0.4 mg by mouth daily.   Yes [provider]  torsemide (DEMADEX) 20 MG tablet Take 1 tablet (20 mg total) by mouth daily. 01/20/21  Yes Milford, Maricela Bo, FNP    Allergies    Naproxen and No healthtouch food allergies  Review of Systems   Review of Systems  Unable to perform ROS: Other   Physical Exam Updated Vital Signs BP 112/75 (BP Location: Left Arm)   Pulse 89   Temp 98.4 F (36.9 C) (Axillary)   Resp 20   Ht 1.702 m ($Remove'5\' 7"'ozIezNq$ )   Wt 80.2 kg   SpO2 99%   BMI 27.69 kg/m   Physical Exam Vitals and nursing note reviewed.  HENT:     Head: Normocephalic.  Mouth/Throat:     Mouth: Mucous membranes are moist.  Eyes:     Pupils: Pupils are equal, round, and reactive to light.  Musculoskeletal:     Cervical back: Normal  range of motion and neck supple.  Neurological:     Mental Status: He is alert.    ED Results / Procedures / Treatments   Labs (all labs ordered are listed, but only abnormal results are displayed) Labs Reviewed  CBC - Abnormal; Notable for the following components:      Result Value   WBC 10.6 (*)    RBC 2.89 (*)    Hemoglobin 7.7 (*)    HCT 26.5 (*)    MCHC 29.1 (*)    RDW 16.9 (*)    Platelets 448 (*)    All other components within normal limits  COMPREHENSIVE METABOLIC PANEL - Abnormal; Notable for the following components:   Potassium 3.0 (*)    Glucose, Bld 120 (*)    Calcium 8.4 (*)    Albumin 2.4 (*)    All other components within normal limits  BRAIN NATRIURETIC PEPTIDE - Abnormal; Notable for the following components:   B Natriuretic Peptide 223.5 (*)    All other components within normal limits  PROTIME-INR - Abnormal; Notable for the following components:   Prothrombin Time 18.2 (*)    INR 1.5 (*)    All other components within normal limits  CBC - Abnormal; Notable for the following components:   RBC 2.64 (*)    Hemoglobin 7.3 (*)    HCT 23.3 (*)    RDW 16.8 (*)    All other components within normal limits  BASIC METABOLIC PANEL - Abnormal; Notable for the following components:   Potassium 3.4 (*)    Glucose, Bld 126 (*)    Calcium 8.5 (*)    All other components within normal limits  VITAMIN B12 - Abnormal; Notable for the following components:   Vitamin B-12 956 (*)    All other components within normal limits  IRON AND TIBC - Abnormal; Notable for the following components:   Iron 20 (*)    TIBC 190 (*)    Saturation Ratios 11 (*)    All other components within normal limits  RETICULOCYTES - Abnormal; Notable for the following components:   RBC. 2.64 (*)    Immature Retic Fract 22.0 (*)    All other components within normal limits  RESP PANEL BY RT-PCR (FLU A&B, COVID) ARPGX2  MRSA NEXT GEN BY PCR, NASAL  URINALYSIS, ROUTINE W REFLEX MICROSCOPIC   MAGNESIUM  TSH  AMMONIA  FOLATE  FERRITIN  HEMOGLOBIN AND HEMATOCRIT, BLOOD  BASIC METABOLIC PANEL    EKG EKG Interpretation  Date/Time:  Saturday January 23 2021 13:59:16 EST Ventricular Rate:  81 PR Interval:    QRS Duration: 119 QT Interval:  489 QTC Calculation: 568 R Axis:   105 Text Interpretation: Right and left arm electrode reversal, interpretation assumes no reversal Atrial fibrillation Nonspecific intraventricular conduction delay Minimal ST depression, lateral leads Confirmed by Pattricia Boss [phone]) on 01/23/2021 6:56:21 PM  Radiology CT Head Wo Contrast  Result Date: 01/23/2021 CLINICAL DATA:  Stroke follow-up. EXAM: CT HEAD WITHOUT CONTRAST TECHNIQUE: Contiguous axial images were obtained from the base of the skull through the vertex without intravenous contrast. COMPARISON:  December 14, 2020 FINDINGS: Brain: A right MCA stroke with encephalomalacia is stable. Ventricular dilatation remains. No acute subdural, epidural, or subarachnoid hemorrhage identified. Cerebellum, brainstem, and basal cisterns are  normal. White matter changes noted. No acute ischemia identified. A small posterior left parietal infarct, unchanged. No mass effect or midline shift. Vascular: Calcified atherosclerosis in the intracranial carotids. Skull: Previous right craniotomy. Sinuses/Orbits: No acute finding. Other: No other abnormalities. IMPRESSION: Chronic right MCA stroke. Chronic left posterior parietal stroke. No acute intracranial abnormality. Electronically Signed   By: Dorise Bullion III M.D.   On: 01/23/2021 15:46   MR BRAIN WO CONTRAST  Result Date: 01/24/2021 CLINICAL DATA:  Delirium EXAM: MRI HEAD WITHOUT CONTRAST TECHNIQUE: Multiplanar, multiecho pulse sequences of the brain and surrounding structures were obtained without intravenous contrast. COMPARISON:  CT head dated 1 day prior, brain MRI 12/18/2020 FINDINGS: Brain: There is significant artifact obscuring large portions of  the axial DWI sequence including the entire posterior fossa. There is no evidence of acute ischemia on the coronal DWI sequence. There is no evidence of acute intracranial hemorrhage or acute extra-axial fluid collection. There is marked global parenchymal volume loss with enlargement of the ventricular system. There is an unchanged large right MCA territorial infarct with associated encephalomalacia and ex vacuo dilatation of the right lateral ventricle. An additional remote infarct in the left parietal lobe is unchanged. There is extensive FLAIR signal abnormality throughout the subcortical and periventricular white matter likely reflecting sequela of advanced chronic white matter microangiopathy. There is a thin subdural collection overlying the right cerebral hemisphere underlying a craniotomy site which may reflect dural thickening or a tiny chronic subdural hematoma. There is SWI signal dropout in the right frontal lobe consistent with interval evolution of the previously seen intraparenchymal hematoma on the MRI of 12/18/2020. There are a few additional scattered punctate chronic microhemorrhages, nonspecific. There is no solid mass lesion.  There is no midline shift. Vascular: Normal flow voids. Skull and upper cervical spine: Postsurgical changes reflecting right frontal parietal craniotomy are again seen. There is no suspicious marrow signal abnormality. Sinuses/Orbits: The paranasal sinuses are clear. Bilateral lens implants are in place. The globes and orbits are otherwise unremarkable. Other: None. IMPRESSION: 1. No evidence of acute intracranial pathology. 2. Interval expected evolution of the small intraparenchymal hematoma in the right frontal lobe since 12/18/2020. 3. Unchanged remote MCA territorial infarct and small remote infarct in the left parietal lobe. 4. 3 mm thick extra-axial signal abnormality overlying the right cerebral hemisphere deep to the craniotomy site may reflect a small chronic  subdural hematoma or dural thickening. The appearance is not significantly changed since 12/18/2020. 5. Unchanged advanced global parenchymal volume loss and chronic white matter microangiopathy. Electronically Signed   By: Valetta Mole M.D.   On: 01/24/2021 16:53   DG Chest Port 1 View  Result Date: 01/23/2021 CLINICAL DATA:  Weight gain.  Shortness of breath. EXAM: PORTABLE CHEST 1 VIEW COMPARISON:  December 31, 2020 FINDINGS: Stable cardiomegaly. Stable hila and mediastinum. A right PICC line terminating central SVC. No pneumothorax. Bilateral pulmonary opacities, right greater than left. No other acute abnormalities. IMPRESSION: Right greater than left pulmonary opacities may represent asymmetric edema, especially given the cardiomegaly and history of weight gain. A multifocal infectious process is considered less likely. Recommend clinical correlation and follow-up to complete resolution. Electronically Signed   By: Dorise Bullion III M.D.   On: 01/23/2021 15:40   EEG adult  Result Date: 01/24/2021 Lora Havens, MD     01/24/2021  6:24 PM Patient Name: [name] [name] MRN: [id number] Epilepsy Attending: Lora Havens Referring Physician/Provider: Dr Derrick Ravel Date: 01/24/2021 Duration: 23.17 mins Patient  history: 80yo m with h/o right MCA stroke, epilepsy who presented with ams. EEG to evaluate for seizure Level of alertness: Awake AEDs during EEG study: LCM Technical aspects: This EEG study was done with scalp electrodes positioned according to the 10-20 International system of electrode placement. Electrical activity was acquired at a sampling rate of $Remov'500Hz'jmIWeq$  and reviewed with a high frequency filter of $RemoveB'70Hz'OhPoqMJm$  and a low frequency filter of $RemoveB'1Hz'PaZzPtnx$ . EEG data were recorded continuously and digitally stored. Description: The posterior dominant rhythm consists of 8-9 Hz activity of moderate voltage (25-35 uV) seen predominantly in posterior head regions, symmetric and reactive to eye opening and  eye closing. EEG showed continuous low amplitude 3 to 6 Hz theta-delta slowing in right frontotemporal region. Patient was noted to have right upper extremity tremor like movement  intermittently throughout the study. Concomitant eeg before, during and after the events didn't show any eeg change to suggest seizure Hyperventilation and photic stimulation were not performed.   ABNORMALITY - Continuous slow, right frontotemporal region. IMPRESSION: This study is suggestive of cortical dysfunction arising from  right frontotemporal region, nonspecific etiology but likely secondary to underlying stroke. No seizures or epileptiform discharges were seen throughout the recording. Patient was noted to have right upper extremity tremor like movement  intermittently throughout the study without concomitant eeg change. These episodes were most likely NOT epileptic. [place]    Procedures .Critical Care Performed by: Pattricia Boss, MD Authorized by: Pattricia Boss, MD   Critical care provider statement:    Critical care time (minutes):  30   Critical care was necessary to treat or prevent imminent or life-threatening deterioration of the following conditions:  CNS failure or compromise   Critical care was time spent personally by me on the following activities:  Development of treatment plan with patient or surrogate, discussions with consultants, evaluation of patient's response to treatment, examination of patient, ordering and review of laboratory studies, ordering and review of radiographic studies, ordering and performing treatments and interventions, pulse oximetry, re-evaluation of patient's condition and review of old charts   Medications Ordered in ED Medications  amantadine (SYMMETREL) 50 MG/5ML solution 100 mg (100 mg Oral Patient Refused/Not Given 01/24/21 1602)  amLODipine (NORVASC) tablet 10 mg (10 mg Oral Given 01/24/21 1055)  apixaban (ELIQUIS) tablet 5 mg (5 mg Oral Given 01/24/21 1055)   atorvastatin (LIPITOR) tablet 80 mg (80 mg Oral Given 01/24/21 1055)  docusate sodium (COLACE) capsule 100 mg (100 mg Oral Given 01/24/21 1055)  finasteride (PROSCAR) tablet 5 mg (5 mg Oral Given 01/24/21 1055)  lacosamide (VIMPAT) tablet 50 mg (50 mg Oral Given 01/24/21 1059)  loratadine (CLARITIN) tablet 10 mg (10 mg Oral Given 01/23/21 2229)  magnesium gluconate (MAGONATE) tablet 500 mg (500 mg Oral Given 01/24/21 1059)  multivitamin with minerals tablet 1 tablet (1 tablet Oral Given 01/24/21 1055)  polyethylene glycol (MIRALAX / GLYCOLAX) packet 17 g (17 g Oral Given 01/24/21 1752)  tamsulosin (FLOMAX) capsule 0.4 mg (0.4 mg Oral Given 01/24/21 1055)  cefTRIAXone (ROCEPHIN) 2 g in sodium chloride 0.9 % 100 mL IVPB (2 g Intravenous New Bag/Given 01/24/21 1119)  ampicillin (OMNIPEN) 2 g in sodium chloride 0.9 % 100 mL IVPB (2 g Intravenous New Bag/Given 01/24/21 1807)  acetaminophen (TYLENOL) tablet 650 mg (has no administration in time range)    Or  acetaminophen (TYLENOL) suppository 650 mg (has no administration in time range)  furosemide (LASIX) injection 40 mg (40 mg Intravenous Given 01/24/21 1746)  polyvinyl alcohol (  LIQUIFILM TEARS) 1.4 % ophthalmic solution 2 drop (2 drops Both Eyes Patient Refused/Not Given 01/24/21 1107)  Chlorhexidine Gluconate Cloth 2 % PADS 6 each (6 each Topical Not Given 01/24/21 1807)  potassium chloride (KLOR-CON) packet 40 mEq (40 mEq Oral Given 01/24/21 0851)  dofetilide (TIKOSYN) capsule 375 mcg (375 mcg Oral Given 01/24/21 1205)  furosemide (LASIX) injection 40 mg (40 mg Intravenous Given 01/23/21 1759)  potassium chloride (KLOR-CON) packet 40 mEq (40 mEq Oral Given 01/24/21 0201)  magnesium sulfate IVPB 2 g 50 mL (2 g Intravenous New Bag/Given 01/24/21 9509)    ED Course  I have reviewed the triage vital signs and the nursing notes.  Pertinent labs & imaging results that were available during my care of the patient were reviewed by me and  considered in my medical decision making (see chart for details).  Clinical Course as of 01/24/21 1909  Sat Jan 23, 2021  1528 Chest x-Samhitha Rosen reviewed with comparison to first prior appears to have increased interstitial markings consistent with other infiltrate or CHF [DR]  1902 WBC(!): 10.6 [DR]  1903 Downward trending hemoglobin with last 8 and today is 7.7. [DR]    Clinical Course User Index [DR] Pattricia Boss, MD   MDM Rules/Calculators/A&P                           80 year old male with chronic atrial fibrillation, history of stroke, on IV antibiotics for endocarditis who presents today with increasing dyspnea.  He has had multiple cardioversions in the past.  He was seen by cardiology yesterday and was noted again to be in A. fib.  They plan an outpatient cardioversion.  However, he is sent in today due to increasing dyspnea.  He also had increasing weight of 4 pounds yesterday and was started on torsemide 20 mg daily. 1 dyspnea likely secondary to A. fib and CHF.  He has increased markings on his chest x-Tahlor Berenguer which could be consistent with volume overload versus infection.  She is given dose of Coumadin here in the ED.  Is not appear to be in any acute respiratory distress although he is somewhat tachypneic. 2-Endocarditis patient is on Ceftin and and ampicillin which is to continue through 1124.  His wife notes that he had some increase in temperature today to 99 but has does not appear of any significant fever.  Here is rectal temperature was normal.  No murmur is noted on exam. 3 anemia hemoglobin has decreased from 8-7.7.  We will discussed with admitting team.  But likely will need at least 1 unit of packed red blood cells. Final Clinical Impression(s) / ED Diagnoses Final diagnoses:  None    Rx / DC Orders ED Discharge Orders     None        Pattricia Boss, MD 01/24/21 1909

## 2021-01-23 NOTE — H&P (Addendum)
History and Physical    [name] [name]:[id number] DOB: [birth year] DOA: 01/23/2021  PCP: [name], MD Patient coming from: [facility]  Chief Complaint: Shortness of breath  HPI: [name] [name] is a 80 y.o. 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 fib, subdural hematoma status post craniotomy in May 2022 -subsequently developed right MCA territory stroke (as Eliquis was held) in June 2022 requiring thrombectomy, bedbound at baseline, communicates minimally.  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  Discharged to SNF with PICC on Rocephin and ampicillin with plan to continue IV antibiotics until 01/28/2021, then amoxicillin 500 mg twice daily indefinitely.  Patient was seen by [facility] on 01/20/2021 and found to be hypoxic with oxygen saturation 83%, placed on 3 L supplemental oxygen.  Also seen by cardiology the same day, weight up by 4 pounds and was started on torsemide 20 mg daily and KCl 10 mEq.  Found to be in A. fib and scheduled for cardioversion  Presenting to the ED via EMS from [facility] for evaluation of dyspnea and 20 pound weight gain since last month.  EMS reported oxygen saturation 87% on room air, improved to 94% with 2 L supplemental oxygen.  Not febrile or tachycardic.  Tachypneic with respiratory rate in the 20s.  Labs showing WBC 10.6 (improved from 15.1 on labs done 3 days ago), hemoglobin 7.7 (no significant change compared to recent labs), platelet count 448k.  Sodium 141, potassium 3.0, chloride 105, bicarb 27, BUN 12, creatinine 0.9, glucose 120.  Calcium 8.4, albumin 2.4.  LFTs normal.  BNP 223.  UA  without signs of infection.  COVID and influenza PCR negative.  Chest x-ray showing cardiomegaly and right greater than left pulmonary opacities concerning for pulmonary edema.  CT head showing chronic right MCA stroke and chronic left posterior parietal stroke; no acute intracranial abnormality. Patient was given IV Lasix 40 mg, ampicillin, and oral potassium 60 mEq.  Cardiology consulted by ED physician.  Patient is confused and not able to give any history.  He has no complaints.  History provided by wife at bedside.  Wife states patient has been feeling short of breath for the past 3 days.  States previously he was taking furosemide but they went to see his cardiologist 3 days ago who changed his medication to torsemide.  He was also started on a potassium supplement.  He has gained 20 pounds since last month.  Wife states patient has had 2 prior strokes and history of subdural hematoma.  Since then has weakness in his left arm and difficulty swallowing.  Bedbound at baseline.  He is able to tolerate dysphagia 3 diet which was recommended during his prior hospitalization.  Wife states patient was recently admitted for a "superbug infection" and is taking Rocephin and ampicillin.  Wife states patient has memory problems at baseline but has been more confused lately.  Per wife, no symptoms of GI bleed such as hematemesis, hematochezia, or melena.  Review of Systems:  All systems reviewed and apart from history of presenting illness, are negative.  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    Other and unspecified hyperlipidemia    Pre-diabetes    Previous back surgery 1978, jan 2007   Psychosexual dysfunction with inhibited sexual excitement    SOB (shortness of breath)    "with heavy exercise"   Stroke ([place]) 08/2013   "I WAS IN AFIB AND THREW A CLOT, THE EFFECTS WERE TRANSITORY AND DIDNT LAST BUT FOR 30 MINUTES"    Thoracic aortic aneurysm     Past Surgical History:  Procedure Laterality Date   AORTIC AND MITRAL VALVE REPLACEMENT     09/2004   CARDIOVERSION     3 times from 2004-2006   CARDIOVERSION N/A 09/26/2013   Procedure: CARDIOVERSION;  Surgeon: Larey Dresser, MD;  Location: [place];  Service: Cardiovascular;  Laterality: N/A;   CARDIOVERSION N/A 06/19/2014   Procedure: CARDIOVERSION;  Surgeon: Jerline Pain, MD;  Location: [place];  Service: Cardiovascular;  Laterality: N/A;   CARDIOVERSION N/A 04/24/2017   Procedure: CARDIOVERSION;  Surgeon: Larey Dresser, MD;  Location: [place];  Service: Cardiovascular;  Laterality: N/A;   CARDIOVERSION N/A 06/08/2017   Procedure: CARDIOVERSION;  Surgeon: Pixie Casino, MD;  Location[facility] ENDOSCOPY;  Service: Cardiovascular;  Laterality: N/A;   CARDIOVERSION N/A 08/24/2017   Procedure: CARDIOVERSION;  Surgeon: Skeet Latch, MD;  Location: [name] ENDOSCOPY;  Service: Cardiovascular;  Laterality: N/A;   CARDIOVERSION N/A 02/14/2018   Procedure: CARDIOVERSION;  Surgeon: Larey Dresser, MD;  Location: [facility] ENDOSCOPY;  Service: Cardiovascular;  Laterality: N/A;   COLONOSCOPY     CRANIOTOMY N/A 07/28/2020   Procedure: CRANIOTOMY FOR EVACUATION OF SUBDURAL HEMATOMA;  Surgeon: Eustace Juday, MD;  Location: [place];  Service: Neurosurgery;  Laterality: N/A;   IR ANGIO VERTEBRAL SEL SUBCLAVIAN INNOMINATE UNI R MOD SED  08/07/2020   IR CT HEAD LTD  08/07/2020   IR PERCUTANEOUS ART THROMBECTOMY/INFUSION INTRACRANIAL INC DIAG ANGIO  08/07/2020    laminectomies     10/1975 and in 03/2005   RADIOLOGY WITH ANESTHESIA N/A 08/07/2020   Procedure: IR WITH ANESTHESIA;  Surgeon: Luanne Bras, MD;  Location: [place];  Service: Radiology;  Laterality: N/A;   TEE WITHOUT CARDIOVERSION N/A 12/18/2020   Procedure: TRANSESOPHAGEAL ECHOCARDIOGRAM (TEE);  Surgeon: Larey Dresser, MD;  Location: [facility] ENDOSCOPY;  Service: Cardiovascular;  Laterality: N/A;   [place] Right 04/03/2018   Procedure: TOTAL HIP ARTHROPLASTY ANTERIOR APPROACH;  Surgeon: Paralee Cancel, MD;  Location: [facility];  Service: Orthopedics;  Laterality: Right;  70 mins     reports that he has never smoked. He has never used smokeless tobacco. He reports that he does not drink alcohol and does not use drugs.  Allergies  Allergen Reactions   Naproxen Hives   No [employer] Food Allergies Other (See Comments)    Scallops - distress, nausea and vomitting    Family History  Problem Relation Age of Onset   Macular degeneration Mother        macular degeneration  Cancer Father        intestinal/GI   Diabetes Paternal Aunt    Heart attack Maternal Grandfather    Diabetes Brother     Prior to Admission medications   Medication Sig Start Date End Date Taking? Authorizing Provider  acetaminophen (TYLENOL) 325 MG tablet Take 650 mg by mouth every 4 (four) hours as needed for fever or mild pain.   Yes [provider]  amantadine (SYMMETREL) 50 MG/5ML solution Take 10 mLs (100 mg total) by mouth 2 (two) times daily. 8 AM and 2 PM 01/21/21  Yes Wert, Margreta Journey, NP  amLODipine (NORVASC) 10 MG tablet Take 1 tablet (10 mg total) by mouth daily. 01/04/21  Yes Thurnell Lose, MD  ampicillin IVPB Inject 12 g into the vein daily. As a continuous infusion. Indication:  Enterococcus endocarditis  First Dose: Yes Last Day of Therapy:  01/28/21 Labs - Once weekly:  CBC/D and BMP, Labs - Every other week:  ESR and CRP Method of administration:  Ambulatory Pump (Continuous Infusion) Method of administration may be changed at the discretion of home infusion pharmacist based upon assessment of the patient and/or caregiver's ability to self-administer the medication ordered. Patient taking differently: Inject 2 g into the vein See admin instructions. 24ml/hour over 4 hours. 0800 and 2000 doses should be run at 36ml/hour over 3 hours (in order to give Rocephin at 1100/2300. Use SASH method of flushing after rocephin before resuming Ampicillin 01/04/21 02/07/21 Yes Thurnell Lose, MD  apixaban (ELIQUIS) 5 MG TABS tablet Take 1 tablet (5 mg total) by mouth 2 (two) times daily. 01/04/21  Yes Thurnell Lose, MD  Artificial Saliva (BIOTENE MOISTURIZING MOUTH MT) Use as directed 2 sprays in the mouth or throat in the morning, at noon, and at bedtime.   Yes [provider]  atorvastatin (LIPITOR) 80 MG tablet Take 1 tablet (80 mg total) by mouth daily. 01/04/21  Yes Thurnell Lose, MD  bisacodyl (DULCOLAX) 10 MG suppository Place 1 suppository (10 mg total) rectally daily as needed for moderate constipation. 01/04/21  Yes Thurnell Lose, MD  cefTRIAXone (ROCEPHIN) IVPB Inject 2 g into the vein every 12 (twelve) hours. Indication:  Enterococcal endocarditis  First Dose: Yes Last Day of Therapy:  01/28/21 Labs - Once weekly:  CBC/D and BMP, Labs - Every other week:  ESR and CRP Method of administration: IV Push Method of administration may be changed at the discretion of home infusion pharmacist based upon assessment of the patient and/or caregiver's ability to self-administer the medication ordered. Patient taking differently: Inject 2 g into the vein every 12 (twelve) hours. 165ml/hour over 30 minutes. 01/04/21 02/07/21 Yes Thurnell Lose, MD  clobetasol cream (TEMOVATE) 6.33 % Apply 1 application topically daily as needed (for skin irritation).  05/20/14  Yes [provider]  docusate sodium (COLACE) 100 MG capsule Take 1  capsule (100 mg total) by mouth 2 (two) times daily. 01/04/21 02/03/21 Yes Thurnell Lose, MD  dofetilide (TIKOSYN) 125 MCG capsule Take 3 capsules (375 mcg total) by mouth 2 (two) times daily. 01/04/21  Yes Thurnell Lose, MD  famotidine (PEPCID) 20 MG tablet Take 20 mg by mouth at bedtime as needed for indigestion.   Yes [provider]  ferrous sulfate 220 (44 Fe) MG/5ML solution Take 240 mg by mouth every Monday, Wednesday, and Friday.   Yes [provider]  finasteride (PROSCAR) 5 MG tablet Take 1 tablet (5 mg total) by mouth daily.  01/04/21  Yes Thurnell Lose, MD  hydrocortisone cream 1 % Apply topically 2 (two) times daily as needed for itching (rash). 09/08/20  Yes Love, Ivan Anchors, PA-C  lacosamide (VIMPAT) 50 MG TABS tablet Take 1 tablet (50 mg total) by mouth 2 (two) times daily. 01/04/21  Yes Thurnell Lose, MD  levalbuterol Penne Lash) 0.63 MG/3ML nebulizer solution Take 0.63 mg by nebulization every 6 (six) hours as needed for wheezing or shortness of breath.   Yes [provider]  loratadine (CLARITIN) 10 MG tablet Take 10 mg by mouth at bedtime.   Yes [provider]  LORazepam (ATIVAN) 0.5 MG tablet Take 0.5 tablets (0.25 mg total) by mouth every 6 (six) hours as needed for up to 14 days for anxiety (x 72 hours). 01/21/21 02/04/21 Yes Wert, Margreta Journey, NP  magnesium gluconate (MAGONATE) 500 MG tablet Take 1 tablet (500 mg total) by mouth daily. 01/04/21  Yes Thurnell Lose, MD  Multiple Vitamin (MULTIVITAMIN WITH MINERALS) TABS tablet Take 1 tablet by mouth daily. 01/05/21  Yes Thurnell Lose, MD  Polyethyl Glycol-Propyl Glycol (SYSTANE) 0.4-0.3 % SOLN Place 2 drops into both eyes daily.   Yes [provider]  polyethylene glycol (MIRALAX / GLYCOLAX) 17 g packet Take 17 g by mouth daily as needed for mild constipation.   Yes [provider]  potassium chloride SA (KLOR-CON) 10 MEQ tablet Take 1 tablet (10 mEq total) by  mouth daily. 01/20/21  Yes [place], Maricela Bo, FNP  tamsulosin (FLOMAX) 0.4 MG CAPS capsule Take 0.4 mg by mouth daily.   Yes [provider]  torsemide (DEMADEX) 20 MG tablet Take 1 tablet (20 mg total) by mouth daily. 01/20/21  Yes Rafael Bihari, [place]    Physical Exam: Vitals:   01/23/21 1845 01/23/21 1915 01/23/21 2130 01/23/21 2135  BP: 131/76 137/78 (!) 124/97   Pulse: 93 96 (!) 103   Resp: (!) 25 (!) 25 (!) 34   Temp:    98.6 F (37 C)  TempSrc:    Axillary  SpO2: 92% 99% 95%   Weight:      Height:        Physical Exam Constitutional:      General: He is not in acute distress. HENT:     Head: Normocephalic and atraumatic.  Eyes:     Extraocular Movements: Extraocular movements intact.     Conjunctiva/sclera: Conjunctivae normal.  Cardiovascular:     Rate and Rhythm: Normal rate and regular rhythm.     Pulses: Normal pulses.  Pulmonary:     Effort: No respiratory distress.     Breath sounds: Rales present. No wheezing.  Abdominal:     General: Bowel sounds are normal.     Palpations: Abdomen is soft.     Tenderness: There is no abdominal tenderness. There is no guarding.  Musculoskeletal:     Cervical back: Normal range of motion and neck supple.     Right lower leg: Edema present.     Left lower leg: Edema present.  Skin:    General: Skin is warm and dry.  Neurological:     Mental Status: He is alert.     Comments: Confused Oriented to person and place Has left upper extremity weakness (not acute per wife) Strength intact in the right upper extremity and bilateral lower extremities.     Labs on Admission: I have personally reviewed following labs and imaging studies  CBC: Recent Labs  Lab 01/18/21 0000 01/20/21  1631 01/23/21 1355  WBC 10.6  10.6 15.1* 10.6*  HGB 8.7*  8.7* 8.2* 7.7*  HCT 27*  27* 26.8* 26.5*  MCV  --  90.5 91.7  PLT 415*  415* 448* 121*   Basic Metabolic Panel: Recent Labs  Lab 01/18/21 0000 01/20/21 1631  01/23/21 1355  NA 142 140 141  K 3.5 3.6 3.0*  CL 108 108 105  CO2 23* 22 27  GLUCOSE  --  121* 120*  BUN $Re'11 14 12  'gnx$ CREATININE 0.7 0.92 0.93  CALCIUM 8.8 8.6* 8.4*   GFR: Estimated Creatinine Clearance: 67.8 mL/min (by C-G formula based on SCr of 0.93 mg/dL). Liver Function Tests: Recent Labs  Lab 01/20/21 1631 01/23/21 1355  AST 27 37  ALT 35 34  ALKPHOS 115 107  BILITOT 0.6 0.7  PROT 6.9 6.9  ALBUMIN 2.5* 2.4*   No results for input(s): LIPASE, AMYLASE in the last 168 hours. No results for input(s): AMMONIA in the last 168 hours. Coagulation Profile: Recent Labs  Lab 01/23/21 1355  INR 1.5*   Cardiac Enzymes: No results for input(s): CKTOTAL, CKMB, CKMBINDEX, TROPONINI in the last 168 hours. BNP (last 3 results) No results for input(s): PROBNP in the last 8760 hours. HbA1C: No results for input(s): HGBA1C in the last 72 hours. CBG: No results for input(s): GLUCAP in the last 168 hours. Lipid Profile: No results for input(s): CHOL, HDL, LDLCALC, TRIG, CHOLHDL, LDLDIRECT in the last 72 hours. Thyroid Function Tests: No results for input(s): TSH, T4TOTAL, FREET4, T3FREE, THYROIDAB in the last 72 hours. Anemia Panel: No results for input(s): VITAMINB12, FOLATE, FERRITIN, TIBC, IRON, RETICCTPCT in the last 72 hours. Urine analysis:    Component Value Date/Time   COLORURINE YELLOW 01/23/2021 [place] 01/23/2021 1357   LABSPEC 1.009 01/23/2021 1357   PHURINE 7.0 01/23/2021 1357   GLUCOSEU NEGATIVE 01/23/2021 1357   [place] 01/23/2021 [place] 01/23/2021 1357   BILIRUBINUR negative 11/22/2013 0848   KETONESUR NEGATIVE 01/23/2021 1357   PROTEINUR NEGATIVE 01/23/2021 1357   UROBILINOGEN 0.2 11/22/2013 0848   UROBILINOGEN 1.0 08/05/2013 2207   NITRITE NEGATIVE 01/23/2021 1357   LEUKOCYTESUR NEGATIVE 01/23/2021 1357    Radiological Exams on Admission: CT Head Wo Contrast  Result Date: 01/23/2021 CLINICAL DATA:   Stroke follow-up. EXAM: CT HEAD WITHOUT CONTRAST TECHNIQUE: Contiguous axial images were obtained from the base of the skull through the vertex without intravenous contrast. COMPARISON:  December 14, 2020 FINDINGS: Brain: A right MCA stroke with encephalomalacia is stable. Ventricular dilatation remains. No acute subdural, epidural, or subarachnoid hemorrhage identified. Cerebellum, brainstem, and basal cisterns are normal. White matter changes noted. No acute ischemia identified. A small posterior left parietal infarct, unchanged. No mass effect or midline shift. Vascular: Calcified atherosclerosis in the intracranial carotids. Skull: Previous right craniotomy. Sinuses/Orbits: No acute finding. Other: No other abnormalities. IMPRESSION: Chronic right MCA stroke. Chronic left posterior parietal stroke. No acute intracranial abnormality. Electronically Signed   By: Dorise Bullion III M.D.   On: 01/23/2021 15:46   DG Chest Port 1 View  Result Date: 01/23/2021 CLINICAL DATA:  Weight gain.  Shortness of breath. EXAM: PORTABLE CHEST 1 VIEW COMPARISON:  December 31, 2020 FINDINGS: Stable cardiomegaly. Stable hila and mediastinum. A right PICC line terminating central SVC. No pneumothorax. Bilateral pulmonary opacities, right greater than left. No other acute abnormalities. IMPRESSION: Right greater than left pulmonary opacities may represent asymmetric edema, especially given the cardiomegaly and history of weight  gain. A multifocal infectious process is considered less likely. Recommend clinical correlation and follow-up to complete resolution. Electronically Signed   By: Dorise Bullion III M.D.   On: 01/23/2021 15:40    EKG: Independently reviewed.  Rate controlled A. fib, QTC 568.  Assessment/Plan Principal Problem:   CHF exacerbation (HCC) Active Problems:   Hyperlipidemia with target LDL less than 130   Essential hypertension   Mitral valve disorder   Long term (current) use of anticoagulants    Acute hypoxemic respiratory failure secondary to acute on chronic diastolic CHF Oxygen saturation 87% on room air with EMS, currently satting well on 2 L supplemental oxygen.  Patient appears volume overloaded on exam with rales and peripheral edema.  Recent weight gain of >4 lbs and started on torsemide 20 mg daily during cardiology visit 3 days ago.  BNP 223. Chest x-ray showing cardiomegaly and right greater than left pulmonary opacities concerning for pulmonary edema. Echo done 12/15/2020 showing EF 65 to 70% and indeterminate diastolic parameters. -Continue diuresis with IV Lasix 40 mg twice daily.  Monitor intake and output, daily weights.  Low-sodium diet with fluid restriction.  Continue supplemental oxygen, wean as tolerated.  Cardiology consulted.  Acute encephalopathy UA without signs of infection.  COVID and influenza PCR negative. CT head showing chronic right MCA stroke and chronic left posterior parietal stroke; no acute intracranial abnormality.  Per recent discharge summary, patient has residual left-sided weakness leg more than arm, expressive aphasia, and dysphagia.  However, on exam today, his left arm appears significantly weaker than his left leg.  Per wife, he is more confused from baseline. -Discussed with Dr. Cheral Marker from neurology who recommends brain MRI, if positive for stroke then reconsult neurology.  Stat brain MRI ordered.  Also EEG ordered given history of seizures.  Check TSH, B12, and ammonia levels.  Hypokalemia QT prolongation Potassium 3.0, QTc 568. -Keep potassium above 4 and magnesium above 2.  Repeat EKG in a.m. Patient is Tikosyn for chronic A. fib which could also be causing QT prolongation.  QTC was 474 on EKG done 3 days ago.  Discussed with on-call cardiologist Dr. Renella Cunas who recommends holding Tikosyn for now.  He will review the patient's chart and EKG and give recommendations.  Acute on chronic normocytic anemia Hemoglobin 7.7, no significant change  compared to recent labs.  Per wife, no symptoms of acute GI bleed. 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  Blood cultures on 10/15 positive for staph epidermidis (likely contaminant).  Repeat blood cultures on 10/17 negative.  PICC placed and was discharged on Rocephin and ampicillin with plan to continue IV antibiotics until 01/28/2021, then amoxicillin 500 mg twice daily indefinitely.  No fever or signs of sepsis at this time.  WBC count improved compared to recent labs. -Continue IV antibiotics for now  History of right MCA territory stroke in June 2022 status post thrombectomy on 6/3 and recent acute left cerebellar vermis CVA in October 2022 -Brain MRI ordered as mentioned above.   History of seizure disorder -Continue Vimpat.  EEG ordered as mentioned above.  History of rheumatic heart disease status post remote AVR/ MVR (bioprosthetic valve) -Continue anticoagulation (Addendum 01/24/2021 at 5:29 AM: Continue anticoagulation for A. fib as mentioned below.)  A. Fib Currently rate controlled.  Seen by cardiology 3 days ago and scheduled for cardioversion. -Continue Eliquis.  Cardiology consulted and recommending holding Tikosyn as  mentioned above.  Hyperlipidemia LFTs normal. -Continue Lipitor  Hypertension Stable. -Continue amlodipine  BPH -Continue finasteride and tamsulosin  DVT prophylaxis: Eliquis Code Status: DNR/DNI -discussed with the patient's wife Family Communication: Wife at bedside. Disposition Plan: Status is: Inpatient  Remains inpatient appropriate because: Decompensated CHF, encephalopathy  Level of care: Level of care: Telemetry Cardiac  The medical decision making on this patient was of high complexity and the patient is at high risk for clinical deterioration, therefore this is a level 3 visit.  Shela Leff  MD [facility]  If 7PM-7AM, please contact night-coverage [email or web address]  01/23/2021, 10:21 PM

## 2021-01-24 ENCOUNTER — Inpatient Hospital Stay (HOSPITAL_COMMUNITY): Payer: Medicare Other

## 2021-01-24 ENCOUNTER — Other Ambulatory Visit: Payer: Self-pay

## 2021-01-24 DIAGNOSIS — I059 Rheumatic mitral valve disease, unspecified: Secondary | ICD-10-CM

## 2021-01-24 DIAGNOSIS — Z7901 Long term (current) use of anticoagulants: Secondary | ICD-10-CM

## 2021-01-24 DIAGNOSIS — E785 Hyperlipidemia, unspecified: Secondary | ICD-10-CM

## 2021-01-24 DIAGNOSIS — R569 Unspecified convulsions: Secondary | ICD-10-CM

## 2021-01-24 DIAGNOSIS — R4182 Altered mental status, unspecified: Secondary | ICD-10-CM

## 2021-01-24 DIAGNOSIS — I10 Essential (primary) hypertension: Secondary | ICD-10-CM

## 2021-01-24 DIAGNOSIS — I4819 Other persistent atrial fibrillation: Secondary | ICD-10-CM

## 2021-01-24 DIAGNOSIS — Z7189 Other specified counseling: Secondary | ICD-10-CM

## 2021-01-24 LAB — AMMONIA: Ammonia: 22 umol/L (ref 9–35)

## 2021-01-24 LAB — RETICULOCYTES
Immature Retic Fract: 22 % — ABNORMAL HIGH (ref 2.3–15.9)
RBC.: 2.64 MIL/uL — ABNORMAL LOW (ref 4.22–5.81)
Retic Count, Absolute: 65.5 10*3/uL (ref 19.0–186.0)
Retic Ct Pct: 2.5 % (ref 0.4–3.1)

## 2021-01-24 LAB — IRON AND TIBC
Iron: 20 ug/dL — ABNORMAL LOW (ref 45–182)
Saturation Ratios: 11 % — ABNORMAL LOW (ref 17.9–39.5)
TIBC: 190 ug/dL — ABNORMAL LOW (ref 250–450)
UIBC: 170 ug/dL

## 2021-01-24 LAB — BASIC METABOLIC PANEL
Anion gap: 8 (ref 5–15)
BUN: 11 mg/dL (ref 8–23)
CO2: 27 mmol/L (ref 22–32)
Calcium: 8.5 mg/dL — ABNORMAL LOW (ref 8.9–10.3)
Chloride: 107 mmol/L (ref 98–111)
Creatinine, Ser: 0.94 mg/dL (ref 0.61–1.24)
GFR, Estimated: >60 mL/min (ref >60)
Glucose, Bld: 126 mg/dL — ABNORMAL HIGH (ref 70–99)
Potassium: 3.4 mmol/L — ABNORMAL LOW (ref 3.5–5.1)
Sodium: 142 mmol/L (ref 135–145)

## 2021-01-24 LAB — CBC
HCT: 23.3 % — ABNORMAL LOW (ref 39.0–52.0)
Hemoglobin: 7.3 g/dL — ABNORMAL LOW (ref 13.0–17.0)
MCH: 27.7 pg (ref 26.0–34.0)
MCHC: 31.3 g/dL (ref 30.0–36.0)
MCV: 88.3 fL (ref 80.0–100.0)
Platelets: 395 10*3/uL (ref 150–400)
RBC: 2.64 MIL/uL — ABNORMAL LOW (ref 4.22–5.81)
RDW: 16.8 % — ABNORMAL HIGH (ref 11.5–15.5)
WBC: 10 10*3/uL (ref 4.0–10.5)
nRBC: 0 % (ref 0.0–0.2)

## 2021-01-24 LAB — MRSA NEXT GEN BY PCR, NASAL: MRSA by PCR Next Gen: NOT DETECTED

## 2021-01-24 LAB — MAGNESIUM: Magnesium: 1.9 mg/dL (ref 1.7–2.4)

## 2021-01-24 LAB — VITAMIN B12: Vitamin B-12: 956 pg/mL — ABNORMAL HIGH (ref 180–914)

## 2021-01-24 LAB — TSH: TSH: 1.773 u[IU]/mL (ref 0.350–4.500)

## 2021-01-24 LAB — FOLATE: Folate: 19.8 ng/mL (ref >5.9)

## 2021-01-24 LAB — FERRITIN: Ferritin: 238 ng/mL (ref 24–336)

## 2021-01-24 MED ORDER — CHLORHEXIDINE GLUCONATE CLOTH 2 % EX PADS
6.0000 | MEDICATED_PAD | Freq: Every day | CUTANEOUS | Status: DC
  Administered 2021-01-24 – 2021-01-27 (×4): 6 via TOPICAL

## 2021-01-24 MED ORDER — DOFETILIDE 250 MCG PO CAPS
375.0000 ug | ORAL_CAPSULE | Freq: Two times a day (BID) | ORAL | Status: DC
  Administered 2021-01-24 (×2): 375 ug via ORAL
  Filled 2021-01-24 (×3): qty 1

## 2021-01-24 MED ORDER — MAGNESIUM SULFATE 2 GM/50ML IV SOLN
2.0000 g | Freq: Once | INTRAVENOUS | Status: AC
  Administered 2021-01-24: 2 g via INTRAVENOUS
  Filled 2021-01-24: qty 50

## 2021-01-24 MED ORDER — POTASSIUM CHLORIDE 20 MEQ PO PACK
40.0000 meq | PACK | Freq: Two times a day (BID) | ORAL | Status: AC
  Administered 2021-01-24 (×2): 40 meq via ORAL
  Filled 2021-01-24 (×2): qty 2

## 2021-01-24 NOTE — Consult Note (Signed)
Palliative Medicine Inpatient Consult Note  Reason for consult:  goals of care conversation   HPI:  Per intake H&P 11/19 by Dr. Marlowe Sax --> [name] [name] is a 80 y.o. 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 fib, subdural hematoma status post craniotomy in May 2022 -subsequently developed right MCA territory stroke (as [name] [name] was held) in June 2022 requiring thrombectomy, bedbound at baseline, communicates minimally.  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  Discharged to SNF with PICC on [name] [name] and ampicillin with plan to continue IV antibiotics until 01/28/2021, then amoxicillin 500 mg twice daily indefinitely.   Patient was seen by [facility] on 01/20/2021 and found to be hypoxic with oxygen saturation 83%, placed on 3 L supplemental oxygen.  Also seen by cardiology the same day, weight up by 4 pounds and was started on torsemide 20 mg daily and KCl 10 mEq.  Found to be in A. fib and scheduled for cardioversion   Presenting to the ED via EMS from [facility] for evaluation of dyspnea and 20 pound weight gain since last month.  EMS reported oxygen saturation 87% on room air, improved to 94% with 2 L supplemental oxygen.  Not febrile or tachycardic.  Tachypneic with respiratory rate in the 20s.  Labs showing WBC 10.6 (improved from 15.1 on labs done 3 days ago), hemoglobin 7.7 (no significant change compared to recent labs), platelet count 448k.  Sodium 141, potassium 3.0, chloride 105, bicarb 27, BUN 12, creatinine 0.9, glucose 120.  Calcium 8.4, albumin 2.4.  LFTs normal.  BNP 223.  UA without signs of infection.  COVID and influenza  PCR negative.  Chest x-ray showing cardiomegaly and right greater than left pulmonary opacities concerning for pulmonary edema.  CT head showing chronic right MCA stroke and chronic left posterior parietal stroke; no acute intracranial abnormality. Patient was given IV Lasix 40 mg, ampicillin, and oral potassium 60 mEq.  Cardiology consulted by ED physician.   Patient is confused and not able to give any history.  He has no complaints.  History provided by wife at bedside.  Wife states patient has been feeling short of breath for the past 3 days.  States previously he was taking furosemide but they went to see his cardiologist 3 days ago who changed his medication to torsemide.  He was also started on a potassium supplement.  He has gained 20 pounds since last month.  Wife states patient has had 2 prior strokes and history of subdural hematoma.  Since then has weakness in his left arm and difficulty swallowing.  Bedbound at baseline.  He is able to tolerate dysphagia 3 diet which was recommended during his prior hospitalization.  Wife states patient was recently admitted for a "superbug infection" and is taking [name] [name] and ampicillin.  Wife states patient has memory problems at baseline but has been more confused lately.  Per wife, no symptoms of GI bleed such as hematemesis, hematochezia, or melena."   Clinical Assessment/Goals of Care: I have reviewed medical records including EPIC notes, labs and imaging, received report from bedside RN Post, and assessed the patient. Dr. Thurmond Butts was at bedside when I arrived.   I met with [name] [name] and his wife, [name] [name], to  further discuss diagnosis prognosis, GOC, EOL wishes, disposition and options. [name] [name] was oriented to self only; therefore the conversation was with his wife. [name] [name] dosed off and on while we were conversing.   I introduced Palliative Medicine as specialized medical care for people living with serious illness. It focuses on providing  relief from the symptoms and stress of a serious illness. The goal is to improve quality of life for both the patient and the family.  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 He has been with her wife [name] [name] for 75 year, married for 8. They have two sons [name] [name] ([name] [name]) who lives local and [name] ([name] [name]).  Is hobbies have included gardening until May of this year. His likes murder mysteries and football. He is a Development worker, community. In July [name] [name] moved into independent living at [name] and Mr. Yerger into rehab.  Most recently he has been in skilled nursing at [name].   They are of baptist faith and have great support from their church community and friends locally.  A detailed discussion was had today regarding advanced directives.  Concepts specific to code status, artifical feeding and hydration, continued IV antibiotics and rehospitalization was had.  The difference between a aggressive medical intervention path  and a palliative comfort care path for this patient at this time was had. Values and goals of care important to patient and family were attempted to be elicited. [name] [name] affirmed the DNR status in his orders. She would like continued aggressive treatment for her husband including IVF, antibiotics, feeding tube if indicated (short term) and evaluate. She states he has had a feeding tube recently and it ws able to be weaned. She would want rehospitalization if needed. She states he was feeding himself and walking some prior to the last admission.   Symptoms to be managed: Breathlessness- Mrs. Mault states he does not complain but she can see him having difficulty with the fluid onboard. Anxiety- Mrs. Brumbaugh states he was given ativan once and it snowed him for 18 hours and she would prefer that he not receive it.  She denies any other symptoms to manage.   I offered outpatient palliative referral with discharge. Mrs. Tabbert  will discuss with their son [name] [name] and let us know if they would like this referral made.   Discussed the importance of continued conversation with family and their  medical providers regarding overall plan of care and treatment options, ensuring decisions are within the context of the patient's values and GOCs.  Decision Maker: MPOA- Wife [name] [name] [phone]  SUMMARY OF RECOMMENDATIONS    Code Status/Advance Care Planning:  DNAR/DNI, may use medication, suctioning  Symptom Management:  Breathlessness-oxygen prn, diuresis of fluid with furosemide and potassium replacement, daily weights, cardiology is following Anxiety - continue neurological evaluation, provider reorientation frequently, balance care to allow for rest periods, avoid lorazepam unless in small doses   Palliative Prophylaxis:  Pain- acetaminophen prn Bowel regimen- docusate sodium prn, polyethylene glycol prn Dry eyes- polyethylene glycol drops prn  Additional Recommendations (Limitations, Scope, Preferences): Continue antibiotics and aggressive treatment. PT/OT once acute diagnostic workup is complete   Psycho-social/Spiritual:  Desire for further Chaplaincy support: No Additional Recommendations: Consider outpatient palliative care referral at discharge if family is in agreement for continued support   Prognosis: Older gentleman with multiple comorbidities, at risk for continued decline.  Discharge Planning: [facility] for rehab if appropriate,  otherwise SNF at [facility].  Vitals with BMI 01/24/2021 01/24/2021 01/24/2021  Height - - -  Weight - 176 lbs 13 oz -  BMI - 22.24 -  Systolic 114 - 643  Diastolic 85 - 78  Pulse 81 - 80    PPS: 40%   This conversation/these recommendations were discussed with patient primary care team, Dr. Bonner Puna.  Thank you for the opportunity to participate in the care of this patient and family.   Time In: 9:45-11:00 Total Time: >70 minutes Greater than 50%  of this  time was spent counseling and coordinating care related to the above assessment and plan.  Lindell Spar, NP [facility] Palliative Medicine Team Team Cell Phone: [phone] Please utilize secure chat with additional questions, if there is no response within 30 minutes please call the above phone number  Palliative Medicine Team providers are available by phone from 7am to 7pm daily and can be reached through the team cell phone.  Should this patient require assistance outside of these hours, please call the patient's attending physician.

## 2021-01-24 NOTE — Progress Notes (Signed)
HOSPITAL MEDICINE OVERNIGHT EVENT NOTE    Notified by nursing that repeat ECG this AM is available.  ECG reviewed revealing accelerated junctional rhythm.  Qtc is improving at now at 504 down from 568.  Tikosyn is currently being held and cardiology consult pending according to admitting provider.  Monitoring on telemetry.    [name] Emerald  MD [facility]

## 2021-01-24 NOTE — TOC Initial Note (Signed)
Transition of Care [facility]) - Initial/Assessment Note    Patient Details  Name: [name] [name] MRN: [id number] Date of Birth: [birth year]  Transition of Care [facility]) CM/SW Contact:    Maebelle Munroe, RN Phone Number: 01/24/2021, 11:39 AM  Clinical Narrative:  [facility] team for discharge planning. Palliative care goals of care discussion had with spouse  Pt is a DNR. Spouse wants patient to return to [facility] for rehab. Spouse plans to discuss discharge and longterm plan with son Wille Glaser. Will continue to monitor for discharge planning needs.               Expected Discharge Plan: [place] ([facility].) Barriers to Discharge: Continued Medical Work up (Continues IV for diuresis and electrolyte repletion.)   Patient Goals and CMS Choice Patient states their goals for this hospitalization and ongoing recovery are:: Return to [facility]      Expected Discharge Plan and Services Expected Discharge Plan: [place] ([facility].) In-house Referral: Clinical Social Work Discharge Planning Services: CM Consult                                          Prior Living Arrangements/Services   Lives with:: Facility Resident (Has spouse.)          Need for Family Participation in Patient Care: Yes (Comment) (Wife Herbert [phone]) Care giver support system in place?: Yes (comment) (Wife- Agnes)      Activities of Daily Living Home Assistive Devices/Equipment: Other (Comment) ADL Screening (condition at time of admission) Patient's cognitive ability adequate to safely complete daily activities?: No Is the patient deaf or have difficulty hearing?: No Does the patient have difficulty seeing, even when wearing glasses/contacts?: No Does the patient have difficulty concentrating, remembering, or making decisions?: Yes Patient able to express need for assistance with ADLs?: No Does the patient have difficulty dressing or bathing?: Yes Independently  performs ADLs?: No Communication: Needs assistance Is this a change from baseline?: Pre-admission baseline Dressing (OT): Dependent Is this a change from baseline?: Pre-admission baseline Grooming: Dependent Is this a change from baseline?: Pre-admission baseline Feeding: Dependent Is this a change from baseline?: Pre-admission baseline Bathing: Dependent Is this a change from baseline?: Pre-admission baseline Toileting: Dependent Is this a change from baseline?: Pre-admission baseline In/Out Bed: Dependent Is this a change from baseline?: Pre-admission baseline Walks in Home: Dependent Is this a change from baseline?: Pre-admission baseline Does the patient have difficulty walking or climbing stairs?: Yes Weakness of Legs: Both Weakness of Arms/Hands: Left  Permission Sought/Granted                  Emotional Assessment              Admission diagnosis:  CHF exacerbation ([place]) [I50.9] Patient Active Problem List   Diagnosis Date Noted   CHF exacerbation ([place]) 01/23/2021   Tremor of unknown origin 01/18/2021   Prosthetic valve endocarditis ([place])    Protein-calorie malnutrition, severe 12/18/2020   S/P MVR (mitral valve repair)    History of CVA in adulthood    Bacteremia 12/15/2020   History of aortic valve replacement    History of mitral valve replacement    Pressure injury of skin 12/14/2020   Hypernatremia 12/14/2020   Somnolence    Diabetes mellitus type II, controlled ([place]) 10/15/2020   Oropharyngeal dysphagia    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 ([place])    Acute ischemic right MCA stroke ([place]) 08/15/2020   CVA (cerebral vascular accident) ([place]) 08/07/2020   Middle cerebral artery embolism, 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   Subdural hemorrhage following injury 07/31/2020   S/P craniotomy 07/28/2020   Subdural hematoma 07/27/2020   Vertigo 08/30/2018   S/P right THA, 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   Long term (current) use of  anticoagulants 01/05/2018   History of food allergy 12/11/2017   Allergic reaction 12/11/2017   Emesis 12/11/2017   Visit for monitoring Tikosyn therapy 06/06/2017   Internal hemorrhoid, bleeding 05/19/2016   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   Aortic valve disorder 07/09/2015   Long term current use of anticoagulant therapy 01/19/2015   Atrial fibrillation, chronic ([place]) 01/20/2014   Elevated PSA 12/23/2013   Prediabetes 12/19/2013   Benign prostatic hyperplasia with urinary obstruction 10/29/2013   left frontal cerebral infarct secondary to afib 08/05/2013   Atrial flutter ([place]) 05/17/2013   Essential hypertension 06/19/2012   Routine health maintenance 04/26/2011   Persistent atrial fibrillation ([place]) 07/10/2008   Hyperlipidemia with target LDL less than 130 07/07/2008   History of colonic polyps 06/16/2008   ERECTILE DYSFUNCTION 09/25/2007   PCP:  [name], MD Pharmacy:   [place], [place] - [address] [name] [address] Phone: [phone] Fax: [phone]     Social Determinants of Health (SDOH) Interventions    Readmission Risk Interventions No flowsheet data found.

## 2021-01-24 NOTE — Consult Note (Signed)
Cardiology Consultation:   Patient ID: [name] [name] MRN: [id number]; DOB: 1940-06-29  Admit date: 01/23/2021 Date of Consult: 01/24/2021  Primary Care Provider: [name], MD [facility] Cardiologist: Loralie Champagne, MD  [facility] Electrophysiologist:  None   Patient Profile:   [name] [name] is a 80 y.o. 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 Pattricia Boss.   History of Present Illness:   [name] presented from [place] with 20 pound weight gain since early October along with hypoxia to 87% on room air which improved with 2 L nasal cannula to 94%.  Baseline functional status is bedbound with left hemiparesis and minimal communication.  He was seen in cardiology clinic 4 days ago post recent hospital discharge for heart failure exacerbation.  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  TTE did not show obvious vegetation but TEE showed bioprosthetic mitral valve vegetation with trivial MR and a mean gradient of 8.  He was treated with ceftriaxone/ampicillin with stop date of 01/28/2021.  He was then plan to continue on amoxicillin 500 mg twice daily indefinitely for chronic suppression.  His hospital course was complicated by encephalopathy and neurology was consulted with MRI brain showing a new left cerebellar vermis CVA that thought was thought to be secondary to his MV endocarditis and septic emboli.  He was discharged back to SNF with a weight of 187 pounds.  His wife provided the majority of his history during his recent  clinic visit and said that he was not eating or drinking as much and his cognition waxes and wanes.  He was still requiring Foley catheter use for retention issues and had a new oxygen requirement on 3.5 L.  His weight was up 4 pounds in clinic and he was started on torsemide 20 mg daily with 1 week follow-up.  He was in atrial fibrillation during clinic follow-up while on dofetilide 375 mcg twice daily secondary to issues in the past with long QT.  He was still receiving apixaban 5 mg twice daily and arrangements were made for cardioversion as an outpatient.  Mr. Searls was only oriented to self during my evaluation and unable to give any history.  Family not at bedside.    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    Other and unspecified hyperlipidemia    Pre-diabetes    Previous back surgery 1978, jan 2007   Psychosexual dysfunction with inhibited sexual excitement  SOB (shortness of breath)    "with heavy exercise"   Stroke ([place]) 08/2013   "I WAS IN AFIB AND THREW A CLOT, THE EFFECTS WERE TRANSITORY AND DIDNT LAST BUT FOR 30 MINUTES"    Thoracic aortic aneurysm    Past Surgical History:  Procedure Laterality Date   AORTIC AND MITRAL VALVE REPLACEMENT     09/2004   CARDIOVERSION     3 times from 2004-2006   CARDIOVERSION N/A 09/26/2013   Procedure: CARDIOVERSION;  Surgeon: Larey Dresser, MD;  Location: [place];  Service: Cardiovascular;  Laterality: N/A;    CARDIOVERSION N/A 06/19/2014   Procedure: CARDIOVERSION;  Surgeon: Jerline Pain, MD;  Location: [place];  Service: Cardiovascular;  Laterality: N/A;   CARDIOVERSION N/A 04/24/2017   Procedure: CARDIOVERSION;  Surgeon: Larey Dresser, MD;  Location: [place];  Service: Cardiovascular;  Laterality: N/A;   CARDIOVERSION N/A 06/08/2017   Procedure: CARDIOVERSION;  Surgeon: Pixie Casino, MD;  Location: [facility] ENDOSCOPY;  Service: Cardiovascular;  Laterality: N/A;   CARDIOVERSION N/A 08/24/2017   Procedure: CARDIOVERSION;  Surgeon: Skeet Latch, MD;  Location: [facility] ENDOSCOPY;  Service: Cardiovascular;  Laterality: N/A;   CARDIOVERSION N/A 02/14/2018   Procedure: CARDIOVERSION;  Surgeon: Larey Dresser, MD;  Location: [facility] ENDOSCOPY;  Service: Cardiovascular;  Laterality: N/A;   COLONOSCOPY     CRANIOTOMY N/A 07/28/2020   Procedure: CRANIOTOMY FOR EVACUATION OF SUBDURAL HEMATOMA;  Surgeon: Eustace Deal, MD;  Location: [place];  Service: Neurosurgery;  Laterality: N/A;   IR ANGIO VERTEBRAL SEL SUBCLAVIAN INNOMINATE UNI R MOD SED  08/07/2020   IR CT HEAD LTD  08/07/2020   IR PERCUTANEOUS ART THROMBECTOMY/INFUSION INTRACRANIAL INC DIAG ANGIO  08/07/2020   laminectomies     10/1975 and in 03/2005   RADIOLOGY WITH ANESTHESIA N/A 08/07/2020   Procedure: IR WITH ANESTHESIA;  Surgeon: Luanne Bras, MD;  Location: [place];  Service: Radiology;  Laterality: N/A;   TEE WITHOUT CARDIOVERSION N/A 12/18/2020   Procedure: TRANSESOPHAGEAL ECHOCARDIOGRAM (TEE);  Surgeon: Larey Dresser, MD;  Location: [facility] ENDOSCOPY;  Service: Cardiovascular;  Laterality: N/A;   [place] Right 04/03/2018   Procedure: TOTAL HIP ARTHROPLASTY ANTERIOR APPROACH;  Surgeon: Paralee Cancel, MD;  Location: [facility];  Service: Orthopedics;  Laterality: Right;  70 mins    Home Medications:  Prior to Admission medications   Medication Sig Start Date End Date Taking? Authorizing Provider   acetaminophen (TYLENOL) 325 MG tablet Take 650 mg by mouth every 4 (four) hours as needed for fever or mild pain.   Yes [provider]  amantadine (SYMMETREL) 50 MG/5ML solution Take 10 mLs (100 mg total) by mouth 2 (two) times daily. 8 AM and 2 PM 01/21/21  Yes Wert, Margreta Journey, NP  amLODipine (NORVASC) 10 MG tablet Take 1 tablet (10 mg total) by mouth daily. 01/04/21  Yes Thurnell Lose, MD  ampicillin IVPB Inject 12 g into the vein daily. As a continuous infusion. Indication:  Enterococcus endocarditis  First Dose: Yes Last Day of Therapy:  01/28/21 Labs - Once weekly:  CBC/D and BMP, Labs - Every other week:  ESR and CRP Method of administration: Ambulatory Pump (Continuous Infusion) Method of administration may be changed at the discretion of home infusion pharmacist based upon assessment of the patient and/or caregiver's ability to self-administer the medication ordered. Patient taking differently: Inject 2 g into the vein See admin instructions. 33ml/hour over 4 hours. 0800  and 2000 doses should be run at 8ml/hour over 3 hours (in order to give Rocephin at 1100/2300. Use SASH method of flushing after rocephin before resuming Ampicillin 01/04/21 02/07/21 Yes Thurnell Lose, MD  apixaban (ELIQUIS) 5 MG TABS tablet Take 1 tablet (5 mg total) by mouth 2 (two) times daily. 01/04/21  Yes Thurnell Lose, MD  Artificial Saliva (BIOTENE MOISTURIZING MOUTH MT) Use as directed 2 sprays in the mouth or throat in the morning, at noon, and at bedtime.   Yes [provider]  atorvastatin (LIPITOR) 80 MG tablet Take 1 tablet (80 mg total) by mouth daily. 01/04/21  Yes Thurnell Lose, MD  bisacodyl (DULCOLAX) 10 MG suppository Place 1 suppository (10 mg total) rectally daily as needed for moderate constipation. 01/04/21  Yes Thurnell Lose, MD  cefTRIAXone (ROCEPHIN) IVPB Inject 2 g into the vein every 12 (twelve) hours. Indication:  Enterococcal endocarditis  First Dose:  Yes Last Day of Therapy:  01/28/21 Labs - Once weekly:  CBC/D and BMP, Labs - Every other week:  ESR and CRP Method of administration: IV Push Method of administration may be changed at the discretion of home infusion pharmacist based upon assessment of the patient and/or caregiver's ability to self-administer the medication ordered. Patient taking differently: Inject 2 g into the vein every 12 (twelve) hours. 144ml/hour over 30 minutes. 01/04/21 02/07/21 Yes Thurnell Lose, MD  clobetasol cream (TEMOVATE) 6.54 % Apply 1 application topically daily as needed (for skin irritation).  05/20/14  Yes [provider]  docusate sodium (COLACE) 100 MG capsule Take 1 capsule (100 mg total) by mouth 2 (two) times daily. 01/04/21 02/03/21 Yes Thurnell Lose, MD  dofetilide (TIKOSYN) 125 MCG capsule Take 3 capsules (375 mcg total) by mouth 2 (two) times daily. 01/04/21  Yes Thurnell Lose, MD  famotidine (PEPCID) 20 MG tablet Take 20 mg by mouth at bedtime as needed for indigestion.   Yes [provider]  ferrous sulfate 220 (44 Fe) MG/5ML solution Take 240 mg by mouth every Monday, Wednesday, and Friday.   Yes [provider]  finasteride (PROSCAR) 5 MG tablet Take 1 tablet (5 mg total) by mouth daily. 01/04/21  Yes Thurnell Lose, MD  hydrocortisone cream 1 % Apply topically 2 (two) times daily as needed for itching (rash). 09/08/20  Yes Love, Ivan Anchors, PA-C  lacosamide (VIMPAT) 50 MG TABS tablet Take 1 tablet (50 mg total) by mouth 2 (two) times daily. 01/04/21  Yes Thurnell Lose, MD  levalbuterol Penne Lash) 0.63 MG/3ML nebulizer solution Take 0.63 mg by nebulization every 6 (six) hours as needed for wheezing or shortness of breath.   Yes [provider]  loratadine (CLARITIN) 10 MG tablet Take 10 mg by mouth at bedtime.   Yes [provider]  LORazepam (ATIVAN) 0.5 MG tablet Take 0.5 tablets (0.25 mg total) by mouth every 6 (six) hours as needed for up  to 14 days for anxiety (x 72 hours). 01/21/21 02/04/21 Yes Wert, Margreta Journey, NP  magnesium gluconate (MAGONATE) 500 MG tablet Take 1 tablet (500 mg total) by mouth daily. 01/04/21  Yes Thurnell Lose, MD  Multiple Vitamin (MULTIVITAMIN WITH MINERALS) TABS tablet Take 1 tablet by mouth daily. 01/05/21  Yes Thurnell Lose, MD  Polyethyl Glycol-Propyl Glycol (SYSTANE) 0.4-0.3 % SOLN Place 2 drops into both eyes daily.   Yes [provider]  polyethylene glycol (MIRALAX / GLYCOLAX) 17 g packet Take 17 g by mouth daily  as needed for mild constipation.   Yes [provider]  potassium chloride SA (KLOR-CON) 10 MEQ tablet Take 1 tablet (10 mEq total) by mouth daily. 01/20/21  Yes Milford, Maricela Bo, FNP  tamsulosin (FLOMAX) 0.4 MG CAPS capsule Take 0.4 mg by mouth daily.   Yes [provider]  torsemide (DEMADEX) 20 MG tablet Take 1 tablet (20 mg total) by mouth daily. 01/20/21  Yes Milford, Maricela Bo, FNP    Inpatient Medications: Scheduled Meds:  amantadine  100 mg Oral BID   amLODipine  10 mg Oral Daily   apixaban  5 mg Oral BID   atorvastatin  80 mg Oral Daily   Chlorhexidine Gluconate Cloth  6 each Topical Daily   docusate sodium  100 mg Oral BID   finasteride  5 mg Oral Daily   furosemide  40 mg Intravenous BID   lacosamide  50 mg Oral BID   loratadine  10 mg Oral QHS   magnesium gluconate  500 mg Oral Daily   multivitamin with minerals  1 tablet Oral Daily   polyvinyl alcohol  2 drop Both Eyes Daily   tamsulosin  0.4 mg Oral Daily   Continuous Infusions:  ampicillin (OMNIPEN) IV 2 g (01/24/21 0156)   cefTRIAXone (ROCEPHIN)  IV 2 g (01/23/21 2333)   PRN Meds: acetaminophen **OR** acetaminophen, polyethylene glycol  Allergies:    Allergies  Allergen Reactions   Naproxen Hives   No [facility] Food Allergies Other (See Comments)    Scallops - distress, nausea and vomitting   Social History:   Social History   Socioeconomic History   Marital  status: Married    Spouse name: Herbert Pun   Number of children: 2   Years of education: BS   Highest education level: Not on file  Occupational History   Occupation: retired  Tobacco Use   Smoking status: Never   Smokeless tobacco: Never  Vaping Use   Vaping Use: Never used  Substance and Sexual Activity   Alcohol use: Never    Comment: wine occasionally   Drug use: Never   Sexual activity: Yes    Partners: Female  Other Topics Concern   Not on file  Social History Narrative   10/27/20 residing at [facility] care center since 09/17/20    [employer] - BS, JD. married - '67. 2 sons - '74, '77  one son is a Nurse, learning disability for [employer]; 4 grandchildren. 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 marriage in good health. End-of-life: discussed issues and provided packet with the MOST form and out of facility order.   Social Determinants of Health   Financial Resource Strain: Not on file  Food Insecurity: Not on file  Transportation Needs: Not on file  Physical Activity: Not on file  Stress: Not on file  Social Connections: Not on file  Intimate Partner Violence: Not on file    Family History:    Family History  Problem Relation Age of Onset   Macular degeneration Mother        macular degeneration   Cancer Father        intestinal/GI   Diabetes Paternal Aunt    Heart attack Maternal Grandfather    Diabetes Brother     ROS: Unable to assess with current mental status Review of Systems: [y] = yes, $Remo'[ ]'pphTZ$  = no   General: Weight gain $RemoveBef'[ ]'PSwPcMtBJb$ ; Weight loss $RemoveBef'[ ]'pyiArmeuEq$ ; Anorexia $RemoveBe'[ ]'TbbtsYggH$ ;  Fatigue [ ] ; Fever [ ] ; Chills [ ] ; Weakness [ ]    Cardiac: Chest pain/pressure [ ] ; Resting SOB [ ] ; Exertional SOB [ ] ; Orthopnea [ ] ; Pedal Edema [ ] ; Palpitations [ ] ; Syncope [ ] ; Presyncope [ ] ; Paroxysmal nocturnal dyspnea [ ]    Pulmonary: Cough [ ] ; Wheezing [ ] ; Hemoptysis [ ] ; Sputum [ ] ; Snoring [ ]    GI: Vomiting [ ] ;  Dysphagia [ ] ; Melena [ ] ; Hematochezia [ ] ; Heartburn [ ] ; Abdominal pain [ ] ; Constipation [ ] ; Diarrhea [ ] ; BRBPR [ ]    GU: Hematuria [ ] ; Dysuria [ ] ; Nocturia [ ]  Vascular: Pain in legs with walking [ ] ; Pain in feet with lying flat [ ] ; Non-healing sores [ ] ; Stroke [ ] ; TIA [ ] ; Slurred speech [ ] ;   Neuro: Headaches [ ] ; Vertigo [ ] ; Seizures [ ] ; Paresthesias [ ] ;Blurred vision [ ] ; Diplopia [ ] ; Vision changes [ ]    Ortho/Skin: Arthritis [ ] ; Joint pain [ ] ; Muscle pain [ ] ; Joint swelling [ ] ; Back Pain [ ] ; Rash [ ]    Psych: Depression [ ] ; Anxiety [ ]    Heme: Bleeding problems [ ] ; Clotting disorders [ ] ; Anemia [ ]    Endocrine: Diabetes [ ] ; Thyroid dysfunction [ ]    Physical Exam/Data:   Vitals:   01/23/21 2135 01/23/21 2230 01/23/21 2244 01/24/21 0411  BP:   (!) 115/96 130/78  Pulse:    80  Resp:   20 19  Temp: 98.6 F (37 C)  98.7 F (37.1 C) 97.7 F (36.5 C)  TempSrc: Axillary  Axillary Axillary  SpO2:    100%  Weight:  80.2 kg    Height:  5\' 7"  (1.702 m)      Intake/Output Summary (Last 24 hours) at 01/24/2021 0433 Last data filed at 01/24/2021 0330 Gross per 24 hour  Intake 660 ml  Output 3125 ml  Net -2465 ml   Last 3 Weights 01/23/2021 01/23/2021 01/20/2021  Weight (lbs) 176 lb 12.9 oz 184 lb 11.9 oz 184 lb 12.8 oz  Weight (kg) 80.2 kg 83.8 kg 83.825 kg     Body mass index is 27.69 kg/m.  General:  Well nourished, well developed, in no acute distress HEENT: normal Lymph: no adenopathy Neck: no JVD Endocrine:  No thryomegaly Vascular: No carotid bruits; FA pulses 2+ bilaterally without bruits  Cardiac: irregular rhythm, regular rate, no M/R/G Lungs:  clear to auscultation bilaterally, no wheezing, rhonchi or rales  Abd: soft, nontender, no hepatomegaly  Ext: no edema Musculoskeletal:  No deformities, BUE and BLE strength normal and equal Skin: warm and dry  Neuro: unable to assess Psych: unable to assess  EKG:  The EKG was personally  reviewed and demonstrates: poor baseline on initial ECG, QTC over estimated, AF initially, repeat ECG showing Qtc prolonged but misreading, marked 1st deg AVB Telemetry:  Telemetry was personally reviewed and demonstrates: AF->NSR  Relevant CV Studies:  TTE Result date: 12/15/20  1. The mitral valve has been replaced with an unknown bioprosthesis. No  evidence of mitral valve regurgitation. The mean mitral valve gradient is  10.0 mmHg with average heart rate of 101 bpm. PHT 70 ms. Suboptimal LVOT  spectral Doppler- VTI deferred.  Date of Procedure Date: 10/18/2004.   2. The aortic valve has been repaired/replaced. Aortic valve  regurgitation is not visualized. 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 Procedure Date:  10/18/2004. Aortic valve  mean gradient measures 16.0 mmHg.   Aortic valve acceleration time measures 74 msec.   3. Left ventricular ejection fraction, by estimation, is 65 to 70%. The  left ventricle has normal function. The left ventricle has no regional  wall motion abnormalities. There is mild concentric left ventricular  hypertrophy. Left ventricular diastolic  parameters are indeterminate.   4. Aortic dilatation noted. There is moderate dilatation of the aortic  root, measuring 48 mm. 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   5. Right ventricular systolic function is normal. The right ventricular  size is normal.   Laboratory Data:  High Sensitivity Troponin:  No results for input(s): TROPONINIHS in the last 720 hours.   Chemistry Recent Labs  Lab 01/20/21 1631 01/23/21 1355 01/24/21 0154  NA 140 141 142  K 3.6 3.0* 3.4*  CL 108 105 107  CO2 $Re'22 27 27  'nBT$ GLUCOSE 121* 120* 126*  BUN $Re'14 12 11  'zRz$ CREATININE 0.92 0.93 0.94  CALCIUM 8.6* 8.4* 8.5*  GFRNONAA >60 >60 >60  ANIONGAP $RemoveB'10 9 8    'CXOkBocB$ Recent Labs  Lab 01/20/21 1631 01/23/21 1355  PROT 6.9 6.9  ALBUMIN 2.5* 2.4*  AST 27 37   ALT 35 34  ALKPHOS 115 107  BILITOT 0.6 0.7   Hematology Recent Labs  Lab 01/20/21 1631 01/23/21 1355 01/24/21 0154 01/24/21 0157  WBC 15.1* 10.6* 10.0  --   RBC 2.96* 2.89* 2.64* 2.64*  HGB 8.2* 7.7* 7.3*  --   HCT 26.8* 26.5* 23.3*  --   MCV 90.5 91.7 88.3  --   MCH 27.7 26.6 27.7  --   MCHC 30.6 29.1* 31.3  --   RDW 16.9* 16.9* 16.8*  --   PLT 448* 448* 395  --    BNP Recent Labs  Lab 01/20/21 1631 01/23/21 1355  BNP 286.6* 223.5*    DDimer No results for input(s): DDIMER in the last 168 hours.  Radiology/Studies:  CT Head Wo Contrast  Result Date: 01/23/2021 CLINICAL DATA:  Stroke follow-up. EXAM: CT HEAD WITHOUT CONTRAST TECHNIQUE: Contiguous axial images were obtained from the base of the skull through the vertex without intravenous contrast. COMPARISON:  December 14, 2020 FINDINGS: Brain: A right MCA stroke with encephalomalacia is stable. Ventricular dilatation remains. No acute subdural, epidural, or subarachnoid hemorrhage identified. Cerebellum, brainstem, and basal cisterns are normal. White matter changes noted. No acute ischemia identified. A small posterior left parietal infarct, unchanged. No mass effect or midline shift. Vascular: Calcified atherosclerosis in the intracranial carotids. Skull: Previous right craniotomy. Sinuses/Orbits: No acute finding. Other: No other abnormalities. IMPRESSION: Chronic right MCA stroke. Chronic left posterior parietal stroke. No acute intracranial abnormality. Electronically Signed   By: Dorise Bullion III M.D.   On: 01/23/2021 15:46   DG Chest Port 1 View  Result Date: 01/23/2021 CLINICAL DATA:  Weight gain.  Shortness of breath. EXAM: PORTABLE CHEST 1 VIEW COMPARISON:  December 31, 2020 FINDINGS: Stable cardiomegaly. Stable hila and mediastinum. A right PICC line terminating central SVC. No pneumothorax. Bilateral pulmonary opacities, right greater than left. No other acute abnormalities. IMPRESSION: Right greater than left  pulmonary opacities may represent asymmetric edema, especially given the cardiomegaly and history of weight gain. A multifocal infectious process is considered less likely. Recommend clinical correlation and follow-up to complete resolution. Electronically Signed   By: Dorise Bullion III M.D.   On: 01/23/2021 15:40   { Assessment and Plan:   Persistent AF  Mr. Garske  has multiple medical comorbidities and cardiology was consulted for management of his dofetilide in the setting of persistent atrial fibrillation.  Initial ECG concerning for prolongation of his QTC as compared to prior however baseline artifact makes QTC assessment extremely difficult to assess.  1 dose of dofetilide held 11/19 p.m. with repeat ECG showing sinus rhythm with a marked first-degree AV block merging with the T waves.  Will resume dofetilide at home dose of 375 mcg q12h. continue apixaban for stroke risk reduction.  For questions or updates, please contact [place] Please consult [email or web address] for contact info under   Signed, Dion Body, MD  01/24/2021 4:33 AM

## 2021-01-24 NOTE — Progress Notes (Signed)
EEG completed, results pending. 

## 2021-01-24 NOTE — Plan of Care (Signed)
  Problem: Clinical Measurements: Goal: Ability to maintain clinical measurements within normal limits will improve Outcome: Progressing   Problem: Clinical Measurements: Goal: Respiratory complications will improve Outcome: Progressing   Problem: Clinical Measurements: Goal: Cardiovascular complication will be avoided Outcome: Progressing   Problem: Activity: Goal: Risk for activity intolerance will decrease Outcome: Progressing   Problem: Nutrition: Goal: Adequate nutrition will be maintained Outcome: Progressing   Problem: Coping: Goal: Level of anxiety will decrease Outcome: Progressing   Problem: Elimination: Goal: Will not experience complications related to bowel motility Outcome: Progressing   Problem: Pain Managment: Goal: General experience of comfort will improve Outcome: Progressing   Problem: Safety: Goal: Ability to remain free from injury will improve Outcome: Progressing   Problem: Skin Integrity: Goal: Risk for impaired skin integrity will decrease Outcome: Progressing   Problem: Cardiac: Goal: Ability to achieve and maintain adequate cardiopulmonary perfusion will improve Outcome: Progressing

## 2021-01-24 NOTE — Progress Notes (Signed)
PROGRESS NOTE  [name] [name]  TWS:[id number] DOB: August 18, 1940 DOA: 01/23/2021 PCP: [name], MD  Outpatient Specialists: Cardiology, Dr. [name] Brief Narrative: [name] [name] is a 80 y.o. 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 He required 2L O2 and work up was consistent with pulmonary edema (bilateral R > L opacities on CXR, BNP 223, clinically overloaded). IV lasix was given with good diuresis. Due to prolonged QT interval, tikosyn was held that night but restarted the next morning on the advice of cardiology. IV antibiotics have been continued.   Assessment & Plan: Principal Problem:   CHF exacerbation ([place]) Active Problems:   Hyperlipidemia with target LDL less than 130   Persistent atrial fibrillation (HCC)   Essential hypertension   Mitral valve disorder   Long term (current) use of anticoagulants  Acute hypoxic respiratory failure:  - Continue weaning attempts to room air with diuresis as below.   Acute on chronic HFpEF: Appears volume up, though weight Weight here 80.2kg. DC weight was 85kg > then documented at [facility] between 81 - 85kg, and 83.8kg at cardiology office 11/16. LVEF was 65-70% on recent echo.  - Continue lasix 40mg  IV BID unless otherwise directed by cardiology since he's diuresed 3L after initial dose.  - Touched base with cardiology team who is evaluating patient.  - I/O, weight monitoring - Sodium restriction. Note hx symptomatic hypernatremia with  recent admission, will not aggressively fluid restrict at this time.  Persistent atrial fibrillation, prolonged QT interval:  - Ok to restart tikosyn per cardiology. Outpatient discussions were considering DCCV. - Supplementing K, Mg as below. - Continue eliquis.  Hypokalemia: Associated with QT prolongation.  - Replace aggressively with loop diuretic administration and prolonged QT. Mg 1.9, supplement.  Enterococcus bacteremia with prosthetic MV endocarditis: Had +blood cultures 10/10, 10/11, 10/13, and negative on 10/17. Leukocytosis has improved, no fever or symptoms of uncontrolled infection at this time. - Continue ceftriaxone and ampicillin through 11/24 with plans to convert to indefinite amoxicillin thereafter. Followed by Dr. Linus Salmons.  Acute metabolic encephalopathy: Improving with treatment as above.  - TSH, ammonia, B12 pending.  - EEG and MRI brain as ordered by Dr. Marlowe Sax. D/w pt and wife, will seek neurology input if acute abnormalities noted.   Cerebrovascular disease, history of strokes: Right MCA June 2022 while holding eliquis due to SDH, and Left cerebellar vermis Oct 2022 ?related to bacteremia. CT head with chronic R MCA and L parietal territory infarcts and no acute changes.  - Continue eliquis (even with high risk hx SDH).  - Continue PT/OT due to persistent weakness - Continue dysphagia 3 diet due to persistent dysphagia  Seizure disorder: Was changed from keppra to vimpat at recent admission due to concern for encephalopathy.  - Continue vimpat - EEG as above  Tremors: No current evidence of ictal periods and tremor currently is better than it had been  - Per report, Dr. [name] reported decreasing amantadine dose by half and monitoring for 2 weeks. Has follow up scheduled 11/30  at 3pm.  HTN:  - Continue norvasc  BPH:  - Continue tamsulosin, finasteride. UA without evidence of infection.  Anemia of chronic disease: Hgb low but at baseline.  - Monitor hgb. May  consider transfusion threshold of 8g/dl in setting of hypoxia/dyspnea. Checking anemia panel.  HLD: LFTs wnl. - Continue statin  Ascending aortic aneurysm:  - Followed as outpatient.  DVT prophylaxis: Eliquis Code Status: DNR Family Communication: Wife at bedside Disposition Plan:  Status is: Inpatient  Remains inpatient appropriate because: Needs supplemental oxygen, IV diuresis, further cardiac monitoring and neurological work up.   Consultants:  Cardiology Palliative care  Procedures:  EEG ordered  Antimicrobials: Ceftriaxone 2g q24h Ampicillin  2g q4h  Subjective: Breathing remains short, no chest pain, he's mentally clearer now than at admission. Didn't have awareness of significant UOP. No abd pain, N/V/D. Wife at bedside assisting with history.   Objective: Vitals:   01/23/21 2244 01/24/21 0411 01/24/21 0500 01/24/21 0842  BP: (!) 115/96 130/78  136/85  Pulse:  80  81  Resp: 20 19  (!) 21  Temp: 98.7 F (37.1 C) 97.7 F (36.5 C)  98.1 F (36.7 C)  TempSrc: Axillary Axillary  Oral  SpO2:  100%  94%  Weight:   80.2 kg   Height:        Intake/Output Summary (Last 24 hours) at 01/24/2021 1106 Last data filed at 01/24/2021 0330 Gross per 24 hour  Intake 660 ml  Output 3125 ml  Net -2465 ml   Filed Weights   01/23/21 1357 01/23/21 2230 01/24/21 0500  Weight: 83.8 kg 80.2 kg 80.2 kg    Gen: Elderly chronically ill-appearing male in no distress Pulm: Non-labored breathing 2L O2, crackles at bases laterally.  CV: Regular with prolonged PR and sinus arrhythmia type changes. +systolic soft murmur, rub, or gallop. No JVD, 2+ pitting symmetric pedal edema. GI: Abdomen soft, non-tender, non-distended, with normoactive bowel sounds. No organomegaly or masses felt. Ext: Warm, dry, LUE weakness with swelling, also less weak affecting BLE's. Skin: No visualized ulcers. Neuro: Alert and incompletely oriented. Dysarthria, aphasia noted. Deficits listed above are  chronic. No new focal neurological deficits. Psych: Judgement and insight appear marginal. Mood & affect appropriate.   Data Reviewed: I have personally reviewed following labs and imaging studies  CBC: Recent Labs  Lab 01/18/21 0000 01/20/21 1631 01/23/21 1355 01/24/21 0154  WBC 10.6  10.6 15.1* 10.6* 10.0  HGB 8.7*  8.7* 8.2* 7.7* 7.3*  HCT 27*  27* 26.8* 26.5* 23.3*  MCV  --  90.5 91.7 88.3  PLT 415*  415* 448* 448* 440   Basic Metabolic Panel: Recent Labs  Lab 01/18/21 0000 01/20/21 1631 01/23/21 1355 01/24/21 0154 01/24/21 0157  NA 142 140 141 142  --   K 3.5 3.6 3.0* 3.4*  --   CL 108 108 105 107  --   CO2 23* 22 27 27   --   GLUCOSE  --  121* 120* 126*  --   BUN 11 14 12 11   --   CREATININE 0.7 0.92 0.93 0.94  --   CALCIUM 8.8 8.6* 8.4* 8.5*  --   MG  --   --   --   --  1.9   GFR: Estimated Creatinine Clearance: 65.7 mL/min (by C-G formula based on SCr of 0.94 mg/dL). Liver Function Tests: Recent Labs  Lab 01/20/21 1631 01/23/21 1355  AST 27 37  ALT 35 34  ALKPHOS 115 107  BILITOT 0.6 0.7  PROT 6.9 6.9  ALBUMIN 2.5* 2.4*   No results for input(s): LIPASE, AMYLASE in the last 168 hours. Recent Labs  Lab 01/24/21 0157  AMMONIA 22   Coagulation Profile: Recent Labs  Lab 01/23/21 1355  INR 1.5*   Cardiac Enzymes: No results for input(s): CKTOTAL, CKMB, CKMBINDEX, TROPONINI in the last 168 hours. BNP (last 3 results) No results for input(s): PROBNP in the last 8760 hours. HbA1C: No results for input(s): HGBA1C in the last 72 hours. CBG: No results for input(s): GLUCAP in the last 168 hours. Lipid Profile: No results for input(s): CHOL, HDL, LDLCALC, TRIG, CHOLHDL, LDLDIRECT in the last 72 hours. Thyroid Function Tests: Recent Labs    01/24/21 0157  TSH 1.773   Anemia Panel: Recent Labs    01/24/21 0157 01/24/21 0158  VITAMINB12 956*  --   FOLATE  --  19.8  FERRITIN 238  --   TIBC 190*  --   IRON 20*  --   RETICCTPCT 2.5  --     Urine analysis:    Component Value Date/Time   COLORURINE YELLOW 01/23/2021 [place] 01/23/2021 1357   LABSPEC 1.009 01/23/2021 1357   PHURINE 7.0 01/23/2021 1357   GLUCOSEU NEGATIVE 01/23/2021 1357   HGBUR NEGATIVE 01/23/2021 1357   [place] 01/23/2021 1357   BILIRUBINUR negative 11/22/2013 0848   KETONESUR NEGATIVE 01/23/2021 1357   PROTEINUR NEGATIVE 01/23/2021 1357   UROBILINOGEN 0.2 11/22/2013 0848   UROBILINOGEN 1.0 08/05/2013 2207   NITRITE NEGATIVE 01/23/2021 1357   LEUKOCYTESUR NEGATIVE 01/23/2021 1357   Recent Results (from the past 240 hour(s))  Resp Panel by RT-PCR (Flu A&B, Covid) Nasopharyngeal Swab     Status: None   Collection Time: 01/23/21  2:32 PM   Specimen: Nasopharyngeal Swab; Nasopharyngeal(NP) swabs in vial transport medium  Result Value Ref Range Status   SARS Coronavirus 2 by RT PCR NEGATIVE NEGATIVE Final    Comment: (NOTE) SARS-CoV-2 target nucleic acids are NOT DETECTED.  The SARS-CoV-2 RNA is generally detectable in upper respiratory specimens during the acute phase of infection. The lowest concentration of SARS-CoV-2 viral copies this assay can detect is 138 copies/mL. A negative result does not preclude SARS-Cov-2 infection and should not be used as the sole basis for treatment or other patient management decisions. A negative result may occur with  improper specimen collection/handling, submission of specimen other than nasopharyngeal swab, presence of viral mutation(s) within the areas targeted by this assay, and inadequate number of viral copies(<138 copies/mL). A negative result must be combined with clinical observations, patient history, and epidemiological information. The expected result is Negative.  Fact Sheet for Patients:  EntrepreneurPulse.com.au  Fact Sheet for Healthcare Providers:  IncredibleEmployment.be  This test is no t yet approved or cleared by the  [place] FDA and  has been authorized for detection and/or diagnosis of SARS-CoV-2 by FDA under an Emergency Use Authorization (EUA). This EUA will remain  in effect (meaning this test can be used) for the duration of the COVID-19 declaration under Section 564(b)(1) of the Act, 21 U.S.C.section 360bbb-3(b)(1), unless the authorization is terminated  or revoked sooner.       Influenza A by PCR NEGATIVE NEGATIVE Final   Influenza B by PCR NEGATIVE NEGATIVE Final    Comment: (NOTE) The Xpert Xpress SARS-CoV-2/FLU/RSV plus assay is intended as an aid in the diagnosis of influenza from Nasopharyngeal swab specimens and should not be used as a sole basis for treatment. Nasal washings and  aspirates are unacceptable for Xpert Xpress SARS-CoV-2/FLU/RSV testing.  Fact Sheet for Patients: EntrepreneurPulse.com.au  Fact Sheet for Healthcare Providers: IncredibleEmployment.be  This test is not yet approved or cleared by the [place] FDA and has been authorized for detection and/or diagnosis of SARS-CoV-2 by FDA under an Emergency Use Authorization (EUA). This EUA will remain in effect (meaning this test can be used) for the duration of the COVID-19 declaration under Section 564(b)(1) of the Act, 21 U.S.C. section 360bbb-3(b)(1), unless the authorization is terminated or revoked.  Performed at [facility], [place] [address] [place], [place] [address]   MRSA Next Gen by PCR, Nasal     Status: None   Collection Time: 01/24/21  4:46 AM   Specimen: Nasal Mucosa; Nasal Swab  Result Value Ref Range Status   MRSA by PCR Next Gen NOT DETECTED NOT DETECTED Final    Comment: (NOTE) The GeneXpert MRSA Assay (FDA approved for NASAL specimens only), is one component of a comprehensive MRSA colonization surveillance program. It is not intended to diagnose MRSA infection nor to guide or monitor treatment for MRSA infections. Test performance is not  FDA approved in patients less than 74 years old. Performed at [facility], [place] [address] [place] [employer], [place] [address]       Radiology Studies: CT Head Wo Contrast  Result Date: 01/23/2021 CLINICAL DATA:  Stroke follow-up. EXAM: CT HEAD WITHOUT CONTRAST TECHNIQUE: Contiguous axial images were obtained from the base of the skull through the vertex without intravenous contrast. COMPARISON:  December 14, 2020 FINDINGS: Brain: A right MCA stroke with encephalomalacia is stable. Ventricular dilatation remains. No acute subdural, epidural, or subarachnoid hemorrhage identified. Cerebellum, brainstem, and basal cisterns are normal. White matter changes noted. No acute ischemia identified. A small posterior left parietal infarct, unchanged. No mass effect or midline shift. Vascular: Calcified atherosclerosis in the intracranial carotids. Skull: Previous right craniotomy. Sinuses/Orbits: No acute finding. Other: No other abnormalities. IMPRESSION: Chronic right MCA stroke. Chronic left posterior parietal stroke. No acute intracranial abnormality. Electronically Signed   By: Dorise Bullion III M.D.   On: 01/23/2021 15:46   DG Chest Port 1 View  Result Date: 01/23/2021 CLINICAL DATA:  Weight gain.  Shortness of breath. EXAM: PORTABLE CHEST 1 VIEW COMPARISON:  December 31, 2020 FINDINGS: Stable cardiomegaly. Stable hila and mediastinum. A right PICC line terminating central SVC. No pneumothorax. Bilateral pulmonary opacities, right greater than left. No other acute abnormalities. IMPRESSION: Right greater than left pulmonary opacities may represent asymmetric edema, especially given the cardiomegaly and history of weight gain. A multifocal infectious process is considered less likely. Recommend clinical correlation and follow-up to complete resolution. Electronically Signed   By: Dorise Bullion III M.D.   On: 01/23/2021 15:40    Scheduled Meds:  amantadine  100 mg Oral BID   amLODipine  10 mg Oral Daily    apixaban  5 mg Oral BID   atorvastatin  80 mg Oral Daily   Chlorhexidine Gluconate Cloth  6 each Topical Daily   docusate sodium  100 mg Oral BID   dofetilide  375 mcg Oral Q12H   finasteride  5 mg Oral Daily   furosemide  40 mg Intravenous BID   lacosamide  50 mg Oral BID   loratadine  10 mg Oral QHS   magnesium gluconate  500 mg Oral Daily   multivitamin with minerals  1 tablet Oral Daily   polyvinyl alcohol  2 drop Both Eyes Daily  potassium chloride  40 mEq Oral BID   tamsulosin  0.4 mg Oral Daily   Continuous Infusions:  ampicillin (OMNIPEN) IV 2 g (01/24/21 0600)   cefTRIAXone (ROCEPHIN)  IV 2 g (01/23/21 2333)     LOS: 1 day   Time spent: 35 minutes.  Patrecia Pour, MD [facility] [email or web address] 01/24/2021, 11:06 AM

## 2021-01-24 NOTE — Progress Notes (Addendum)
Cardiology Consultation:   Patient ID: HANZEL PIZZO MRN: [id number]; DOB: May 07, 1940  Admit date: 01/23/2021 Date of Consult: 01/24/2021  PCP:  [name], MD   [facility] Providers Cardiologist:  Loralie Champagne, MD        Patient Profile:   LYN DEEMER is a 80 y.o. male with a hx of prosthetic valve endocarditis, Enterococcus bacteremia, rheumatic fever, diastolic heart failure, AVR MVR and maze in 2006, atypical atrial flutter, paroxysmal atrial fibrillation, CVA who is being seen 01/24/2021 for the evaluation of shortness of breath, atrial fibrillation at the request of Vance Gather.  History of Present Illness:   [facility]. [facility]ers [facility] after [facility] gain and hypoxia.  He has baseline bedbound with left hemiparesis and minimal communication.  He has been diuresed since being in the hospital.  He has a history of atrial fibrillation with a longstanding history of ablations at the [facility] clinic as above.  He is currently on dofetilide with a dose being reduced at his last hospitalization to 325 mcg twice daily.  He missed his dofetilide dose last night.  His QTC is prolonged on his most recent ECG, though his potassium and magnesium were low on admission.  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  He is currently on suppressive antibiotics.   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    Other and  unspecified hyperlipidemia    Pre-diabetes    Previous back surgery 1978, jan 2007   Psychosexual dysfunction with inhibited sexual excitement    SOB (shortness of breath)    "with heavy exercise"   Stroke ([place]) 08/2013   "I WAS IN AFIB AND THREW A CLOT, THE EFFECTS WERE TRANSITORY AND DIDNT LAST BUT FOR 30 MINUTES"    Thoracic aortic aneurysm     Past Surgical History:  Procedure Laterality Date   AORTIC AND MITRAL VALVE REPLACEMENT     09/2004   CARDIOVERSION     3 times from 2004-2006   CARDIOVERSION N/A 09/26/2013   Procedure: CARDIOVERSION;  Surgeon: Larey Dresser, MD;  Location: [place];  Service: Cardiovascular;  Laterality: N/A;   CARDIOVERSION N/A 06/19/2014   Procedure: CARDIOVERSION;  Surgeon: Jerline Pain, MD;  Location: [place];  Service: Cardiovascular;  Laterality: N/A;   CARDIOVERSION N/A 04/24/2017   Procedure: CARDIOVERSION;  Surgeon: Larey Dresser, MD;  Location: [facility] ENDOSCOPY;  Service: Cardiovascular;  Laterality: N/A;   CARDIOVERSION N/A 06/08/2017   Procedure: CARDIOVERSION;  Surgeon: Pixie Casino, MD;  Location: [facility] ENDOSCOPY;  Service: Cardiovascular;  Laterality: N/A;   CARDIOVERSION N/A 08/24/2017   Procedure: CARDIOVERSION;  Surgeon: Skeet Latch, MD;  Location: [facility] ENDOSCOPY;  Service: Cardiovascular;  Laterality: N/A;   CARDIOVERSION N/A 02/14/2018   Procedure: CARDIOVERSION;  Surgeon: Larey Dresser, MD;  Location: [facility] ENDOSCOPY;  Service:  Cardiovascular;  Laterality: N/A;   COLONOSCOPY     CRANIOTOMY N/A 07/28/2020   Procedure: CRANIOTOMY FOR EVACUATION OF SUBDURAL HEMATOMA;  Surgeon: Eustace Dunshee, MD;  Location: [place];  Service: Neurosurgery;  Laterality: N/A;   IR ANGIO VERTEBRAL SEL SUBCLAVIAN INNOMINATE UNI R MOD SED  08/07/2020   IR CT HEAD LTD  08/07/2020   IR PERCUTANEOUS ART THROMBECTOMY/INFUSION INTRACRANIAL INC DIAG ANGIO  08/07/2020   laminectomies     10/1975 and in 03/2005   RADIOLOGY WITH ANESTHESIA N/A 08/07/2020    Procedure: IR WITH ANESTHESIA;  Surgeon: Luanne Bras, MD;  Location: [place];  Service: Radiology;  Laterality: N/A;   TEE WITHOUT CARDIOVERSION N/A 12/18/2020   Procedure: TRANSESOPHAGEAL ECHOCARDIOGRAM (TEE);  Surgeon: Larey Dresser, MD;  Location: [facility] ENDOSCOPY;  Service: Cardiovascular;  Laterality: N/A;   [place] Right 04/03/2018   Procedure: TOTAL HIP ARTHROPLASTY ANTERIOR APPROACH;  Surgeon: Paralee Cancel, MD;  Location: [facility];  Service: Orthopedics;  Laterality: Right;  70 mins     Home Medications:  Prior to Admission medications   Medication Sig Start Date End Date Taking? Authorizing Provider  acetaminophen (TYLENOL) 325 MG tablet Take 650 mg by mouth every 4 (four) hours as needed for fever or mild pain.   Yes [provider]  amantadine (SYMMETREL) 50 MG/5ML solution Take 10 mLs (100 mg total) by mouth 2 (two) times daily. 8 AM and 2 PM 01/21/21  Yes Wert, Margreta Journey, NP  amLODipine (NORVASC) 10 MG tablet Take 1 tablet (10 mg total) by mouth daily. 01/04/21  Yes Thurnell Lose, MD  ampicillin IVPB Inject 12 g into the vein daily. As a continuous infusion. Indication:  Enterococcus endocarditis  First Dose: Yes Last Day of Therapy:  01/28/21 Labs - Once weekly:  CBC/D and BMP, Labs - Every other week:  ESR and CRP Method of administration: Ambulatory Pump (Continuous Infusion) Method of administration may be changed at the discretion of home infusion pharmacist based upon assessment of the patient and/or caregiver's ability to self-administer the medication ordered. Patient taking differently: Inject 2 g into the vein See admin instructions. 38ml/hour over 4 hours. 0800 and 2000 doses should be run at 57ml/hour over 3 hours (in order to give Rocephin at 1100/2300. Use SASH method of flushing after rocephin before resuming Ampicillin 01/04/21 02/07/21 Yes Thurnell Lose, MD  apixaban (ELIQUIS) 5 MG TABS tablet Take 1 tablet  (5 mg total) by mouth 2 (two) times daily. 01/04/21  Yes Thurnell Lose, MD  Artificial Saliva (BIOTENE MOISTURIZING MOUTH MT) Use as directed 2 sprays in the mouth or throat in the morning, at noon, and at bedtime.   Yes [provider]  atorvastatin (LIPITOR) 80 MG tablet Take 1 tablet (80 mg total) by mouth daily. 01/04/21  Yes Thurnell Lose, MD  bisacodyl (DULCOLAX) 10 MG suppository Place 1 suppository (10 mg total) rectally daily as needed for moderate constipation. 01/04/21  Yes Thurnell Lose, MD  cefTRIAXone (ROCEPHIN) IVPB Inject 2 g into the vein every 12 (twelve) hours. Indication:  Enterococcal endocarditis  First Dose: Yes Last Day of Therapy:  01/28/21 Labs - Once weekly:  CBC/D and BMP, Labs - Every other week:  ESR and CRP Method of administration: IV Push Method of administration may be changed at the discretion of home infusion pharmacist based upon assessment of the patient and/or caregiver's ability to self-administer the medication ordered. Patient taking  differently: Inject 2 g into the vein every 12 (twelve) hours. 141m/hour over 30 minutes. 01/04/21 02/07/21 Yes SThurnell Lose MD  clobetasol cream (TEMOVATE) 09.37% Apply 1 application topically daily as needed (for skin irritation).  05/20/14  Yes [provider]  docusate sodium (COLACE) 100 MG capsule Take 1 capsule (100 mg total) by mouth 2 (two) times daily. 01/04/21 02/03/21 Yes SThurnell Lose MD  dofetilide (TIKOSYN) 125 MCG capsule Take 3 capsules (375 mcg total) by mouth 2 (two) times daily. 01/04/21  Yes SThurnell Lose MD  famotidine (PEPCID) 20 MG tablet Take 20 mg by mouth at bedtime as needed for indigestion.   Yes [provider]  ferrous sulfate 220 (44 Fe) MG/5ML solution Take 240 mg by mouth every Monday, Wednesday, and Friday.   Yes [provider]  finasteride (PROSCAR) 5 MG tablet Take 1 tablet (5 mg total) by mouth daily. 01/04/21  Yes SThurnell Lose MD  hydrocortisone cream 1 % Apply topically 2 (two) times daily as needed for itching (rash). 09/08/20  Yes Love, PIvan Anchors PA-C  lacosamide (VIMPAT) 50 MG TABS tablet Take 1 tablet (50 mg total) by mouth 2 (two) times daily. 01/04/21  Yes SThurnell Lose MD  levalbuterol ([place] 0.63 MG/3ML nebulizer solution Take 0.63 mg by nebulization every 6 (six) hours as needed for wheezing or shortness of breath.   Yes [provider]  loratadine (CLARITIN) 10 MG tablet Take 10 mg by mouth at bedtime.   Yes [provider]  LORazepam (ATIVAN) 0.5 MG tablet Take 0.5 tablets (0.25 mg total) by mouth every 6 (six) hours as needed for up to 14 days for anxiety (x 72 hours). 01/21/21 02/04/21 Yes Wert, CMargreta Journey NP  magnesium gluconate (MAGONATE) 500 MG tablet Take 1 tablet (500 mg total) by mouth daily. 01/04/21  Yes SThurnell Lose MD  Multiple Vitamin (MULTIVITAMIN WITH MINERALS) TABS tablet Take 1 tablet by mouth daily. 01/05/21  Yes SThurnell Lose MD  Polyethyl Glycol-Propyl Glycol (SYSTANE) 0.4-0.3 % SOLN Place 2 drops into both eyes daily.   Yes [provider]  polyethylene glycol (MIRALAX / GLYCOLAX) 17 g packet Take 17 g by mouth daily as needed for mild constipation.   Yes [provider]  potassium chloride SA (KLOR-CON) 10 MEQ tablet Take 1 tablet (10 mEq total) by mouth daily. 01/20/21  Yes Milford, JMaricela Bo FNP  tamsulosin (FLOMAX) 0.4 MG CAPS capsule Take 0.4 mg by mouth daily.   Yes [provider]  torsemide (DEMADEX) 20 MG tablet Take 1 tablet (20 mg total) by mouth daily. 01/20/21  Yes Milford, JMaricela Bo FNP    Inpatient Medications: Scheduled Meds:  amantadine  100 mg Oral BID   amLODipine  10 mg Oral Daily   apixaban  5 mg Oral BID   atorvastatin  80 mg Oral Daily   Chlorhexidine Gluconate Cloth  6 each Topical Daily   docusate sodium  100 mg Oral BID   dofetilide  375 mcg Oral Q12H   finasteride  5 mg Oral Daily    furosemide  40 mg Intravenous BID   lacosamide  50 mg Oral BID   loratadine  10 mg Oral QHS   magnesium gluconate  500 mg Oral Daily   multivitamin with minerals  1 tablet Oral Daily   polyvinyl alcohol  2 drop Both Eyes Daily   potassium chloride  40 mEq Oral BID   tamsulosin  0.4 mg Oral Daily  Continuous Infusions:  ampicillin (OMNIPEN) IV 2 g (01/24/21 0600)   cefTRIAXone (ROCEPHIN)  IV 2 g (01/23/21 2333)   PRN Meds: acetaminophen **OR** acetaminophen, polyethylene glycol  Allergies:    Allergies  Allergen Reactions   Naproxen Hives   No [employer] Food Allergies Other (See Comments)    Scallops - distress, nausea and vomitting    Social History:   Social History   Socioeconomic History   Marital status: Married    Spouse name: Herbert Pun   Number of children: 2   Years of education: BS   Highest education level: Not on file  Occupational History   Occupation: retired  Tobacco Use   Smoking status: Never   Smokeless tobacco: Never  Vaping Use   Vaping Use: Never used  Substance and Sexual Activity   Alcohol use: Never    Comment: wine occasionally   Drug use: Never   Sexual activity: Yes    Partners: Female  Other Topics Concern   Not on file  Social History Narrative   10/27/20 residing at [facility] care center since 09/17/20    [employer] - BS, JD. married - '67. 2 sons - '74, '77  one son is a Nurse, learning disability for [employer]; 4 grandchildren. 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 marriage in good health. End-of-life: discussed issues and provided packet with the MOST form and out of facility order.   Social Determinants of Health   Financial Resource Strain: Not on file  Food Insecurity: Not on file  Transportation Needs: Not on file  Physical Activity: Not on file  Stress: Not on file  Social Connections: Not on file  Intimate Partner Violence: Not on file    Family  History:    Family History  Problem Relation Age of Onset   Macular degeneration Mother        macular degeneration   Cancer Father        intestinal/GI   Diabetes Paternal Aunt    Heart attack Maternal Grandfather    Diabetes Brother      ROS:  Please see the history of present illness.   All other ROS reviewed and negative.     Physical Exam/Data:   Vitals:   01/23/21 2244 01/24/21 0411 01/24/21 0500 01/24/21 0842  BP: (!) 115/96 130/78  136/85  Pulse:  80  81  Resp: 20 19  (!) 21  Temp: 98.7 F (37.1 C) 97.7 F (36.5 C)  98.1 F (36.7 C)  TempSrc: Axillary Axillary  Oral  SpO2:  100%  94%  Weight:   80.2 kg   Height:        Intake/Output Summary (Last 24 hours) at 01/24/2021 1113 Last data filed at 01/24/2021 0330 Gross per 24 hour  Intake 660 ml  Output 3125 ml  Net -2465 ml   Last 3 Weights 01/24/2021 01/23/2021 01/23/2021  Weight (lbs) 176 lb 12.9 oz 176 lb 12.9 oz 184 lb 11.9 oz  Weight (kg) 80.2 kg 80.2 kg 83.8 kg     Body mass index is 27.69 kg/m.  General:  Well nourished, well developed, in no acute distress nonverbal HEENT: normal Neck: no JVD Vascular: No carotid bruits; Distal pulses 2+ bilaterally Cardiac:  normal S1, S2; RRR; no murmur  Lungs:  clear to auscultation bilaterally, no wheezing, rhonchi or rales  Abd: soft, nontender, no hepatomegaly  Ext: no edema Musculoskeletal:  No deformities, BUE and BLE  strength normal and equal Skin: warm and dry  Neuro:  CNs 2-12 intact, no focal abnormalities noted Psych:  Normal affect   EKG:  The EKG was personally reviewed and demonstrates: Sinus rhythm, prolonged QTC Telemetry:  Telemetry was personally reviewed and demonstrates: Sinus rhythm  Relevant CV Studies: TTE 12/15/20  1. The mitral valve has been replaced with an unknown bioprosthesis. No  evidence of mitral valve regurgitation. The mean mitral valve gradient is  10.0 mmHg with average heart rate of 101 bpm. PHT 70 ms. Suboptimal  LVOT  spectral Doppler- VTI deferred.  Date of Procedure Date: 10/18/2004.   2. The aortic valve has been repaired/replaced. Aortic valve  regurgitation is not visualized. 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 Procedure Date:  10/18/2004. Aortic valve mean gradient measures 16.0 mmHg.   Aortic valve acceleration time measures 74 msec.   3. Left ventricular ejection fraction, by estimation, is 65 to 70%. The  left ventricle has normal function. The left ventricle has no regional  wall motion abnormalities. There is mild concentric left ventricular  hypertrophy. Left ventricular diastolic  parameters are indeterminate.   4. Aortic dilatation noted. There is moderate dilatation of the aortic  root, measuring 48 mm. 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   5. Right ventricular systolic function is normal. The right ventricular  size is normal.   Laboratory Data:  High Sensitivity Troponin:  No results for input(s): TROPONINIHS in the last 720 hours.   Chemistry Recent Labs  Lab 01/20/21 1631 01/23/21 1355 01/24/21 0154 01/24/21 0157  NA 140 141 142  --   K 3.6 3.0* 3.4*  --   CL 108 105 107  --   CO2 _0 --   GLUCOSE 121* 120* 126*  --   BUN _1 --   CREATININE 0.92 0.93 0.94  --   CALCIUM 8.6* 8.4* 8.5*  --   MG  --   --   --  1.9  GFRNONAA >60 >60 >60  --   ANIONGAP _2 --     Recent Labs  Lab 01/20/21 1631 01/23/21 1355  PROT 6.9 6.9  ALBUMIN 2.5* 2.4*  AST 27 37  ALT 35 34  ALKPHOS 115 107  BILITOT 0.6 0.7   Lipids No results for input(s): CHOL, TRIG, HDL, LABVLDL, LDLCALC, CHOLHDL in the last 168 hours.  Hematology Recent Labs  Lab 01/20/21 1631 01/23/21 1355 01/24/21 0154 01/24/21 0157  WBC 15.1* 10.6* 10.0  --   RBC 2.96* 2.89* 2.64* 2.64*  HGB 8.2* 7.7* 7.3*  --   HCT 26.8* 26.5* 23.3*  --   MCV 90.5 91.7 88.3  --   MCH 27.7 26.6 27.7  --   MCHC 30.6  29.1* 31.3  --   RDW 16.9* 16.9* 16.8*  --   PLT 448* 448* 395  --    Thyroid  Recent Labs  Lab 01/24/21 0157  TSH 1.773    BNP Recent Labs  Lab 01/20/21 1631 01/23/21 1355  BNP 286.6* 223.5*    DDimer No results for input(s): DDIMER in the last 168 hours.   Radiology/Studies:  CT Head Wo Contrast  Result Date: 01/23/2021 CLINICAL DATA:  Stroke follow-up. EXAM: CT HEAD WITHOUT CONTRAST TECHNIQUE: Contiguous axial images were obtained from the base of the skull through the vertex without intravenous contrast. COMPARISON:  December 14, 2020 FINDINGS: Brain:  A right MCA stroke with encephalomalacia is stable. Ventricular dilatation remains. No acute subdural, epidural, or subarachnoid hemorrhage identified. Cerebellum, brainstem, and basal cisterns are normal. White matter changes noted. No acute ischemia identified. A small posterior left parietal infarct, unchanged. No mass effect or midline shift. Vascular: Calcified atherosclerosis in the intracranial carotids. Skull: Previous right craniotomy. Sinuses/Orbits: No acute finding. Other: No other abnormalities. IMPRESSION: Chronic right MCA stroke. Chronic left posterior parietal stroke. No acute intracranial abnormality. Electronically Signed   By: Dorise Bullion III M.D.   On: 01/23/2021 15:46   DG Chest Port 1 View  Result Date: 01/23/2021 CLINICAL DATA:  Weight gain.  Shortness of breath. EXAM: PORTABLE CHEST 1 VIEW COMPARISON:  December 31, 2020 FINDINGS: Stable cardiomegaly. Stable hila and mediastinum. A right PICC line terminating central SVC. No pneumothorax. Bilateral pulmonary opacities, right greater than left. No other acute abnormalities. IMPRESSION: Right greater than left pulmonary opacities may represent asymmetric edema, especially given the cardiomegaly and history of weight gain. A multifocal infectious process is considered less likely. Recommend clinical correlation and follow-up to complete resolution. Electronically  Signed   By: Dorise Bullion III M.D.   On: 01/23/2021 15:40     Assessment and Plan:   Persistent atrial fibrillation: Patient had previously been on dofetilide.  He has had a very complicated course recently with prosthetic valve endocarditis.  Currently anticoagulated with Eliquis.  He did miss 1 dose of dofetilide on 11/19.  Repeat ECG shows QTC is shortened.  We Kammi Hechler continue dofetilide.  He Miliano Cotten need to move each dose of dofetilide back 1 hour.  We Elliemae Braman arrange for that tomorrow.  We Danniel Grenz get an ECG after the next 4 doses of Tikosyn as long as the patient is in the hospital to further evaluate his QTC.  EP to monitor ECGs, but general cardiology to follow the patient. Acute on chronic diastolic heart failure: Weight has been going up.  He had an x-ray with opacities and an elevated BNP.  Agree with continued diuresis.  Gloris Shiroma need continued potassium repletion with IV Lasix.   For questions or updates, please contact [place] Please consult [email or web address] for contact info under    Signed, Caydee Talkington Meredith Leeds, MD  01/24/2021 11:13 AM

## 2021-01-24 NOTE — Plan of Care (Signed)
  Problem: Clinical Measurements: Goal: Ability to maintain clinical measurements within normal limits will improve Outcome: Progressing   Problem: Clinical Measurements: Goal: Respiratory complications will improve Outcome: Progressing   Problem: Clinical Measurements: Goal: Cardiovascular complication will be avoided Outcome: Progressing   Problem: Activity: Goal: Risk for activity intolerance will decrease Outcome: Progressing   Problem: Nutrition: Goal: Adequate nutrition will be maintained Outcome: Progressing   Problem: Coping: Goal: Level of anxiety will decrease Outcome: Progressing   Problem: Elimination: Goal: Will not experience complications related to urinary retention Outcome: Progressing   Problem: Pain Managment: Goal: General experience of comfort will improve Outcome: Progressing   Problem: Safety: Goal: Ability to remain free from injury will improve Outcome: Progressing   Problem: Skin Integrity: Goal: Risk for impaired skin integrity will decrease Outcome: Progressing   Problem: Cardiac: Goal: Ability to achieve and maintain adequate cardiopulmonary perfusion will improve Outcome: Progressing

## 2021-01-24 NOTE — Procedures (Signed)
Patient Name: [name] [name]  MRN: [id number]  Epilepsy Attending: Lora Havens  Referring Physician/Provider: Dr Derrick Ravel Date: 01/24/2021 Duration: 23.17 mins  Patient history: 80yo m with h/o right MCA stroke, epilepsy who presented with ams. EEG to evaluate for seizure  Level of alertness: Awake  AEDs during EEG study: LCM  Technical aspects: This EEG study was done with scalp electrodes positioned according to the 10-20 International system of electrode placement. Electrical activity was acquired at a sampling rate of 500Hz  and reviewed with a high frequency filter of 70Hz  and a low frequency filter of 1Hz . EEG data were recorded continuously and digitally stored.   Description: The posterior dominant rhythm consists of 8-9 Hz activity of moderate voltage (25-35 uV) seen predominantly in posterior head regions, symmetric and reactive to eye opening and eye closing. EEG showed continuous low amplitude 3 to 6 Hz theta-delta slowing in right frontotemporal region. Patient was noted to have right upper extremity tremor like movement  intermittently throughout the study. Concomitant eeg before, during and after the events didn't show any eeg change to suggest seizure Hyperventilation and photic stimulation were not performed.     ABNORMALITY - Continuous slow, right frontotemporal region.   IMPRESSION: This study is suggestive of cortical dysfunction arising from  right frontotemporal region, nonspecific etiology but likely secondary to underlying stroke. No seizures or epileptiform discharges were seen throughout the recording.  Patient was noted to have right upper extremity tremor like movement  intermittently throughout the study without concomitant eeg change. These episodes were most likely NOT epileptic.  Bulah Lurie Barbra Sarks

## 2021-01-24 NOTE — Progress Notes (Signed)
Pt off the floor for MRI. Will do ECG once back

## 2021-01-25 ENCOUNTER — Other Ambulatory Visit: Payer: Self-pay

## 2021-01-25 DIAGNOSIS — I5031 Acute diastolic (congestive) heart failure: Secondary | ICD-10-CM

## 2021-01-25 LAB — MAGNESIUM: Magnesium: 2.4 mg/dL (ref 1.7–2.4)

## 2021-01-25 LAB — BASIC METABOLIC PANEL
Anion gap: 9 (ref 5–15)
BUN: 11 mg/dL (ref 8–23)
CO2: 27 mmol/L (ref 22–32)
Calcium: 8.2 mg/dL — ABNORMAL LOW (ref 8.9–10.3)
Chloride: 107 mmol/L (ref 98–111)
Creatinine, Ser: 0.94 mg/dL (ref 0.61–1.24)
GFR, Estimated: >60 mL/min (ref >60)
Glucose, Bld: 93 mg/dL (ref 70–99)
Potassium: 3.6 mmol/L (ref 3.5–5.1)
Sodium: 143 mmol/L (ref 135–145)

## 2021-01-25 LAB — HEMOGLOBIN AND HEMATOCRIT, BLOOD
HCT: 24.4 % — ABNORMAL LOW (ref 39.0–52.0)
Hemoglobin: 7.4 g/dL — ABNORMAL LOW (ref 13.0–17.0)

## 2021-01-25 MED ORDER — DOFETILIDE 250 MCG PO CAPS
375.0000 ug | ORAL_CAPSULE | Freq: Two times a day (BID) | ORAL | Status: AC
  Administered 2021-01-25 (×2): 375 ug via ORAL
  Filled 2021-01-25 (×2): qty 1

## 2021-01-25 MED ORDER — POTASSIUM CHLORIDE 20 MEQ PO PACK
40.0000 meq | PACK | Freq: Two times a day (BID) | ORAL | Status: AC
  Administered 2021-01-25 (×2): 40 meq via ORAL
  Filled 2021-01-25 (×2): qty 2

## 2021-01-25 MED ORDER — SODIUM CHLORIDE 0.9% FLUSH: 10.0000 mL | INTRAVENOUS | Status: DC | PRN

## 2021-01-25 MED ORDER — DOCUSATE SODIUM 50 MG/5ML PO LIQD
100.0000 mg | Freq: Two times a day (BID) | ORAL | Status: DC
  Administered 2021-01-25 – 2021-01-26 (×3): 100 mg via ORAL
  Filled 2021-01-25 (×7): qty 10

## 2021-01-25 MED ORDER — DOFETILIDE 250 MCG PO CAPS
375.0000 ug | ORAL_CAPSULE | Freq: Two times a day (BID) | ORAL | Status: DC
  Administered 2021-01-26: 375 ug via ORAL
  Filled 2021-01-25 (×2): qty 1

## 2021-01-25 MED ORDER — DOFETILIDE 250 MCG PO CAPS
375.0000 ug | ORAL_CAPSULE | Freq: Two times a day (BID) | ORAL | Status: DC
  Filled 2021-01-25: qty 1

## 2021-01-25 MED ORDER — SENNOSIDES 8.8 MG/5ML PO SYRP
5.0000 mL | ORAL_SOLUTION | Freq: Every day | ORAL | Status: DC
  Administered 2021-01-25 – 2021-01-26 (×2): 5 mL via ORAL
  Filled 2021-01-25 (×2): qty 5

## 2021-01-25 MED ORDER — SODIUM CHLORIDE 0.9% FLUSH
10.0000 mL | Freq: Two times a day (BID) | INTRAVENOUS | Status: DC
Start: 2021-01-25 — End: 2021-01-27
  Administered 2021-01-26 – 2021-01-27 (×3): 10 mL

## 2021-01-25 NOTE — Progress Notes (Addendum)
Advanced Heart Failure Rounding Note  PCP-Cardiologist: Loralie Champagne, MD   Subjective:    Admitted for a/c diastolic CHF. Started on IV Lasix.   Good UOP yesterday, 4L. Wt down 8 lb.   SCr 0.94 K 3.6 Mg pending  EP following and assisting w/ Tikosyn dosing.  Neurology also following for ? Seizures   Breathing improved. Sitting up in bed. No complaints currently. Wife at bedside.    Objective:   Weight Range: 76.3 kg Body mass index is 26.35 kg/m.   Vital Signs:   Temp:  [98.1 F (36.7 C)-98.5 F (36.9 C)] 98.3 F (36.8 C) (11/21 0321) Pulse Rate:  [81-92] 88 (11/21 0321) Resp:  [20-25] 25 (11/21 0321) BP: (101-148)/(52-88) 148/88 (11/21 0321) SpO2:  [94 %-99 %] 95 % (11/21 0321) Weight:  [76.3 kg] 76.3 kg (11/21 0321) Last BM Date: 01/23/21  Weight change: Filed Weights   01/23/21 2230 01/24/21 0500 01/25/21 0321  Weight: 80.2 kg 80.2 kg 76.3 kg    Intake/Output:   Intake/Output Summary (Last 24 hours) at 01/25/2021 0830 Last data filed at 01/25/2021 0535 Gross per 24 hour  Intake 920 ml  Output 3950 ml  Net -3030 ml      Physical Exam    General:  Well appearing elderly WM. No resp difficulty HEENT: Normal Neck: Supple. JVP to jaw . Carotids 2+ bilat; no bruits. No lymphadenopathy or thyromegaly appreciated. Cor: PMI nondisplaced. Regular rate & rhythm. No rubs, gallops or murmurs. Lungs: Clear Abdomen: Soft, nontender, nondistended. No hepatosplenomegaly. No bruits or masses. Good bowel sounds. Extremities: No cyanosis, clubbing, rash, edema Neuro: Alert & orientedx3, cranial nerves grossly intact. moves all 4 extremities w/o difficulty. Affect pleasant   Telemetry   NSR 80s   EKG    NSR 83 bpm, 1st degree AVB QT/QTcB 414/486 ms  Labs    CBC Recent Labs    01/23/21 1355 01/24/21 0154 01/25/21 0623  WBC 10.6* 10.0  --   HGB 7.7* 7.3* 7.4*  HCT 26.5* 23.3* 24.4*  MCV 91.7 88.3  --   PLT 448* 395  --    Basic Metabolic  Panel Recent Labs    01/24/21 0154 01/24/21 0157 01/25/21 0623  NA 142  --  143  K 3.4*  --  3.6  CL 107  --  107  CO2 27  --  27  GLUCOSE 126*  --  93  BUN 11  --  11  CREATININE 0.94  --  0.94  CALCIUM 8.5*  --  8.2*  MG  --  1.9  --    Liver Function Tests Recent Labs    01/23/21 1355  AST 37  ALT 34  ALKPHOS 107  BILITOT 0.7  PROT 6.9  ALBUMIN 2.4*   No results for input(s): LIPASE, AMYLASE in the last 72 hours. Cardiac Enzymes No results for input(s): CKTOTAL, CKMB, CKMBINDEX, TROPONINI in the last 72 hours.  BNP: BNP (last 3 results) Recent Labs    01/08/21 0000 01/20/21 1631 01/23/21 1355  BNP 319.00 286.6* 223.5*    ProBNP (last 3 results) No results for input(s): PROBNP in the last 8760 hours.   D-Dimer No results for input(s): DDIMER in the last 72 hours. Hemoglobin A1C No results for input(s): HGBA1C in the last 72 hours. Fasting Lipid Panel No results for input(s): CHOL, HDL, LDLCALC, TRIG, CHOLHDL, LDLDIRECT in the last 72 hours. Thyroid Function Tests Recent Labs    01/24/21 0157  TSH 1.773  Other results:   Imaging    MR BRAIN WO CONTRAST  Result Date: 01/24/2021 CLINICAL DATA:  Delirium EXAM: MRI HEAD WITHOUT CONTRAST TECHNIQUE: Multiplanar, multiecho pulse sequences of the brain and surrounding structures were obtained without intravenous contrast. COMPARISON:  CT head dated 1 day prior, brain MRI 12/18/2020 FINDINGS: Brain: There is significant artifact obscuring large portions of the axial DWI sequence including the entire posterior fossa. There is no evidence of acute ischemia on the coronal DWI sequence. There is no evidence of acute intracranial hemorrhage or acute extra-axial fluid collection. There is marked global parenchymal volume loss with enlargement of the ventricular system. There is an unchanged large right MCA territorial infarct with associated encephalomalacia and ex vacuo dilatation of the right lateral  ventricle. An additional remote infarct in the left parietal lobe is unchanged. There is extensive FLAIR signal abnormality throughout the subcortical and periventricular white matter likely reflecting sequela of advanced chronic white matter microangiopathy. There is a thin subdural collection overlying the right cerebral hemisphere underlying a craniotomy site which may reflect dural thickening or a tiny chronic subdural hematoma. There is SWI signal dropout in the right frontal lobe consistent with interval evolution of the previously seen intraparenchymal hematoma on the MRI of 12/18/2020. There are a few additional scattered punctate chronic microhemorrhages, nonspecific. There is no solid mass lesion.  There is no midline shift. Vascular: Normal flow voids. Skull and upper cervical spine: Postsurgical changes reflecting right frontal parietal craniotomy are again seen. There is no suspicious marrow signal abnormality. Sinuses/Orbits: The paranasal sinuses are clear. Bilateral lens implants are in place. The globes and orbits are otherwise unremarkable. Other: None. IMPRESSION: 1. No evidence of acute intracranial pathology. 2. Interval expected evolution of the small intraparenchymal hematoma in the right frontal lobe since 12/18/2020. 3. Unchanged remote MCA territorial infarct and small remote infarct in the left parietal lobe. 4. 3 mm thick extra-axial signal abnormality overlying the right cerebral hemisphere deep to the craniotomy site may reflect a small chronic subdural hematoma or dural thickening. The appearance is not significantly changed since 12/18/2020. 5. Unchanged advanced global parenchymal volume loss and chronic white matter microangiopathy. Electronically Signed   By: Valetta Mole M.D.   On: 01/24/2021 16:53   EEG adult  Result Date: 01/24/2021 Lora Havens, MD     01/24/2021  6:24 PM Patient Name: [name] [name] MRN: [id number] Epilepsy Attending: Lora Havens Referring  Physician/Provider: Dr Derrick Ravel Date: 01/24/2021 Duration: 23.17 mins Patient history: 80yo m with h/o right MCA stroke, epilepsy who presented with ams. EEG to evaluate for seizure Level of alertness: Awake AEDs during EEG study: LCM Technical aspects: This EEG study was done with scalp electrodes positioned according to the 10-20 International system of electrode placement. Electrical activity was acquired at a sampling rate of 500Hz  and reviewed with a high frequency filter of 70Hz  and a low frequency filter of 1Hz . EEG data were recorded continuously and digitally stored. Description: The posterior dominant rhythm consists of 8-9 Hz activity of moderate voltage (25-35 uV) seen predominantly in posterior head regions, symmetric and reactive to eye opening and eye closing. EEG showed continuous low amplitude 3 to 6 Hz theta-delta slowing in right frontotemporal region. Patient was noted to have right upper extremity tremor like movement  intermittently throughout the study. Concomitant eeg before, during and after the events didn't show any eeg change to suggest seizure Hyperventilation and photic stimulation were not performed.   ABNORMALITY - Continuous slow, right  frontotemporal region. IMPRESSION: This study is suggestive of cortical dysfunction arising from  right frontotemporal region, nonspecific etiology but likely secondary to underlying stroke. No seizures or epileptiform discharges were seen throughout the recording. Patient was noted to have right upper extremity tremor like movement  intermittently throughout the study without concomitant eeg change. These episodes were most likely NOT epileptic. Priyanka Barbra Sarks     Medications:     Scheduled Medications:  amantadine  100 mg Oral BID   amLODipine  10 mg Oral Daily   apixaban  5 mg Oral BID   atorvastatin  80 mg Oral Daily   Chlorhexidine Gluconate Cloth  6 each Topical Daily   docusate sodium  100 mg Oral BID   dofetilide  375  mcg Oral Q12H   [START ON 01/26/2021] dofetilide  375 mcg Oral BID   finasteride  5 mg Oral Daily   furosemide  40 mg Intravenous BID   lacosamide  50 mg Oral BID   loratadine  10 mg Oral QHS   magnesium gluconate  500 mg Oral Daily   multivitamin with minerals  1 tablet Oral Daily   polyvinyl alcohol  2 drop Both Eyes Daily   potassium chloride  40 mEq Oral BID   tamsulosin  0.4 mg Oral Daily    Infusions:  ampicillin (OMNIPEN) IV 2 g (01/25/21 0535)   cefTRIAXone (ROCEPHIN)  IV 2 g (01/24/21 2215)    PRN Medications: acetaminophen **OR** acetaminophen, polyethylene glycol    Assessment/Plan   1. Acute on Chronic Diastolic Heart Failure: Most recent echo 10/22 EF 65-70%. Admitted w/ volume overload and dyspnea. Responding well to IV Lasix. Remains fluid overloaded - continue IV Lasix 40 mg bid and supp K  - RN to set up CVP monitoring  2. PAF/ Flutter: NSR on EKG  QT/QTcB 414/486 ms - continue Tikosyn, EP following and assisting w/ dosing - continue Eliquis 5 mg bid - Keep K > 4.0 and Mg > 2.0  3. 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 Neuro consulted this admit for ? Seizures. EEG was negative  4. H/o Enterococcal bacteremia: Source uncertain.  No PNA noted on CXR.  Enterococcus did not grow in urine.  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  He has a new left cerebellar CVA (small) that may be embolic from MV vegetation.  Blood cultures from 10/10, 10/11, 10/13 were positive.  Cultures from 10/15 with S epidermidis, likely contaminant.  Resent 10/17, these cultures remained negative.  - Continues on ceftriaxone and ampicillin.  - Not candidate for cardiac surgery, management will have to be medical.   5. 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  Echo 10/22 with EF 65-70%, normal RV, bioprosthetic AVR mean gradient 16 mmHg appears normal, bioprosthetic MV mean gradient 10, increased from prior but HR also in 100s when study done.  TEE as above showed bioprosthetic MV vegetation with trivial MR and mean gradient 8.  6. Anemia: chronic. Baseline Hgb 7-8 range. 7.4 today  - management per primary team   Palliative care following. DNR/DNI   Length of Stay: 2  Lyda Jester, PA-C  01/25/2021, 8:30 AM  Advanced Heart Failure Team Pager [phone] (M-F; 7a - 5p)  Please contact  [facility] for night-coverage after hours (5p -7a ) and weekends on [email or web address]  Patient seen with PA, agree with the above note.   He diuresed well yesterday, I/Os net negative 3030 cc.  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  CVP 10 when nurse measured (currently PICC is unhooked).   General: NAD Neck: JVP 9-10 cm, no thyromegaly or thyroid nodule.  Lungs: Clear to auscultation bilaterally with normal respiratory effort. CV: Nondisplaced PMI.  Heart regular S1/S2, no S3/S4, 2/6 SEM RUSB.  No peripheral edema.   Abdomen: Soft, nontender, no hepatosplenomegaly, no distention.  Skin: Intact without lesions or rashes.  Neurologic: Alert, interacting.  Psych: Normal affect. Extremities: No clubbing or cyanosis.  HEENT: Normal.   Acute on chronic diastolic CHF => diuresing well with stable creatinine but still volume overloaded.  - Continue Lasix 40 mg IV bid today, eventually transition to po torsemide.   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  QTc looks acceptable on ECG today, would continue current Tikosyn and Eliquis.   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   Stable low hgb, no overt bleeding.  Per primary.   Loralie Champagne 01/25/2021 1:40 PM

## 2021-01-25 NOTE — Addendum Note (Signed)
Encounter addended by: Rafael Bihari, FNP on: 01/25/2021 5:12 PM  Actions taken: Clinical Note Signed

## 2021-01-25 NOTE — Evaluation (Signed)
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   External hemorrhoids without mention of complication    H/O aortic valve replacement    H/O mitral valve replacement    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   Lesion of ulnar nerve    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    Other and unspecified hyperlipidemia    Pre-diabetes    Previous back surgery 1978, jan 2007   Psychosexual dysfunction with inhibited sexual excitement    SOB (shortness of breath)    "with heavy exercise"   Stroke ([place]) 08/2013   "I WAS IN AFIB AND THREW A CLOT, THE EFFECTS WERE TRANSITORY AND DIDNT LAST BUT FOR 30 MINUTES"    Thoracic aortic aneurysm    Past Surgical History:  Past Surgical History:  Procedure Laterality Date   AORTIC AND MITRAL VALVE REPLACEMENT     09/2004   CARDIOVERSION     3 times from 2004-2006   CARDIOVERSION N/A 09/26/2013   Procedure: CARDIOVERSION;  Surgeon: Larey Dresser, MD;  Location: [place];  Service: Cardiovascular;  Laterality: N/A;   CARDIOVERSION N/A 06/19/2014   Procedure: CARDIOVERSION;  Surgeon: Jerline Pain, MD;  Location: [place];  Service: Cardiovascular;  Laterality: N/A;   CARDIOVERSION N/A 04/24/2017   Procedure: CARDIOVERSION;  Surgeon: Larey Dresser, MD;  Location: [place];  Service: Cardiovascular;  Laterality: N/A;   CARDIOVERSION N/A 06/08/2017   Procedure: CARDIOVERSION;  Surgeon: Pixie Casino, MD;   Location: [facility] ENDOSCOPY;  Service: Cardiovascular;  Laterality: N/A;   CARDIOVERSION N/A 08/24/2017   Procedure: CARDIOVERSION;  Surgeon: Skeet Latch, MD;  Location: [facility] ENDOSCOPY;  Service: Cardiovascular;  Laterality: N/A;   CARDIOVERSION N/A 02/14/2018   Procedure: CARDIOVERSION;  Surgeon: Larey Dresser, MD;  Location: [facility] ENDOSCOPY;  Service: Cardiovascular;  Laterality: N/A;   COLONOSCOPY     CRANIOTOMY N/A 07/28/2020   Procedure: CRANIOTOMY FOR EVACUATION OF SUBDURAL HEMATOMA;  Surgeon: Eustace Vittitow, MD;  Location: [place];  Service: Neurosurgery;  Laterality: N/A;   IR ANGIO VERTEBRAL SEL SUBCLAVIAN INNOMINATE UNI R MOD SED  08/07/2020   IR CT HEAD LTD  08/07/2020   IR PERCUTANEOUS ART THROMBECTOMY/INFUSION INTRACRANIAL INC DIAG ANGIO  08/07/2020   laminectomies     10/1975 and in 03/2005   RADIOLOGY WITH ANESTHESIA N/A 08/07/2020   Procedure: IR WITH ANESTHESIA;  Surgeon: Luanne Bras, MD;  Location: [place];  Service: Radiology;  Laterality: N/A;   TEE WITHOUT CARDIOVERSION N/A 12/18/2020   Procedure: TRANSESOPHAGEAL ECHOCARDIOGRAM (TEE);  Surgeon: Larey Dresser, MD;  Location: [facility] ENDOSCOPY;  Service: Cardiovascular;  Laterality: N/A;   Walker Right 04/03/2018   Procedure: TOTAL HIP ARTHROPLASTY ANTERIOR APPROACH;  Surgeon: Paralee Cancel, MD;  Location: [facility];  Service: Orthopedics;  Laterality: Right;  70 mins   HPI:  80  y.o. 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 fib, subdural hematoma status post craniotomy in May 2022 -subsequently developed right MCA territory stroke (as Eliquis was held) in June 2022 requiring thrombectomy, bedbound at baseline, communicates minimally.  Recently admitted 12/14/2020-01/04/2021 for acute metabolic encephalopathy due to hypernatremia, acute left cerebellar vermis CVA, and sepsis  secondary to enterococcal bacteremia with prosthetic mitral valve endocarditis. Presenting to the ED via EMS from [facility] for evaluation of dyspnea and 20 pound weight gain since last month. Chest x-ray showing cardiomegaly and right greater than left pulmonary opacities concerning for pulmonary edema.  CT head showing chronic right MCA stroke and chronic left posterior parietal stroke; no acute intracranial abnormality.    Assessment / Plan / Recommendation  Clinical Impression  Pt was seen for a bedside swallow evaluation. Pt awake and alert but minimally communicative with wife at bedside. Pt presents with swallowing difficulty s/p stroke in June 2022 worsening in October with bacteremia infection. Pt on Dys3/thin liquid diet after MBS during previous admission. Oral mechanism exam revealed generalized oral weakness but was otherwise unremarkable. SLP trialed thin liquid from straw, puree, and regular solids. Pt exhibited difficulty obtaining large amounts of thin liquid from straw but was able to move small amounts. Pt exhibited no overt oral or pharyngeal phase symptoms across consistencies and requires no further f/u from ST. SLP reviewed compensatory strategies with wife. Recommend continue Dys3/thin liquid diet, administering medications crushed in puree per baseline difficulty swallowing pills whole. SLP Visit Diagnosis: Dysphagia, unspecified (R13.10)    Aspiration Risk  Mild aspiration risk    Diet Recommendation Dysphagia 3 (Mech soft);Thin liquid   Liquid Administration via: Straw Medication Administration: Crushed with puree Supervision: Full supervision/cueing for compensatory strategies;Staff to assist with self feeding Compensations: Slow rate;Small sips/bites;Minimize environmental distractions Postural Changes: Seated upright at 90 degrees    Other  Recommendations Oral Care Recommendations: Oral care BID;Staff/trained caregiver to provide oral care    Recommendations for  follow up therapy are one component of a multi-disciplinary discharge planning process, led by the attending physician.  Recommendations may be updated based on patient status, additional functional criteria and insurance authorization.  Follow up Recommendations No SLP follow up      Assistance Recommended at Discharge PRN  Functional Status Assessment Patient has not had a recent decline in their functional status  Frequency and Duration            Prognosis        Swallow Study   General Date of Onset: 01/23/21 HPI: 80 y.o. 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 fib, subdural hematoma status post craniotomy in May 2022 -subsequently developed right MCA territory stroke (as Eliquis was held) in June 2022 requiring thrombectomy, bedbound at baseline, communicates minimally.  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 Presenting to the ED via EMS from [facility] for evaluation of dyspnea and 20 pound weight gain since last month. Chest x-ray showing cardiomegaly and right greater than left pulmonary opacities concerning for pulmonary edema.  CT head showing chronic right MCA stroke and chronic left posterior parietal stroke; no acute intracranial abnormality. Type of Study: Bedside Swallow Evaluation Previous Swallow Assessment: MBS October 2022, Dys3/thin  liquid Diet Prior to this Study: Dysphagia 3 (soft);Thin liquids Temperature Spikes Noted: No Respiratory Status: Nasal cannula History of Recent Intubation: No Behavior/Cognition: Alert;Cooperative;Pleasant mood Oral Cavity Assessment: Within Functional Limits Oral Care Completed by SLP: No Oral Cavity - Dentition: Adequate natural  dentition Vision: Functional for self-feeding Self-Feeding Abilities: Needs assist Patient Positioning: Upright in bed Baseline Vocal Quality: Hoarse;Low vocal intensity Volitional Cough: Weak Volitional Swallow: Unable to elicit    Oral/Motor/Sensory Function Overall Oral Motor/Sensory Function: Generalized oral weakness   Ice Chips Ice chips: Not tested   Thin Liquid Thin Liquid: Within functional limits Presentation: Straw    Nectar Thick Nectar Thick Liquid: Not tested   Honey Thick Honey Thick Liquid: Not tested   Puree Puree: Within functional limits Presentation: Spoon   Solid    Dewitt Rota, SLP-Student  Solid: Within functional limits      Dewitt Rota 01/25/2021,3:39 PM

## 2021-01-25 NOTE — Progress Notes (Addendum)
Initial Nutrition Assessment  DOCUMENTATION CODES:   Severe malnutrition in context of chronic illness  INTERVENTION:   Vital Cuisine Shake BID with lunch and dinner, each supplement provides 520 kcal and 22 grams of protein Continue MVI with minerals daily Change diet to room service yes with assistance   NUTRITION DIAGNOSIS:   Severe Malnutrition related to chronic illness (CHF) as evidenced by severe muscle depletion, severe fat depletion.  GOAL:   Patient will meet greater than or equal to 90% of their needs  MONITOR:   PO intake, Supplement acceptance, Labs  REASON FOR ASSESSMENT:   Consult Assessment of nutrition requirement/status  ASSESSMENT:   80 yo male admitted with acute on chronic diastolic CHF. PMH includes HLD, rheumatic heart disease in childhood, diverticulosis, stroke, CHF.  Patient was started on IV Lasix for diuresis. Patient unable to provide any nutrition history. Spoke with patient's wife at bedside. Patient lost a lot of weight on admission d/t diuresis. He has been eating well since admission. She requests Vital Cuisine shakes for patient because he liked them last admission. She realizes that he is malnourished and needs additional calories and protein. She forgot to order his breakfast today, so it arrived late. Will change diet order to room service yes with assistance so patient will receive a tray even if wife does not order a meal.  Labs reviewed.  Medications reviewed and include Colace, Tikosyn, IV Lasix, Magonate, MVI with minerals, Klor-Con, Senokot, Flomax.  Weight history reviewed. Patient was weighing ~75 kg in September and October 2022, then up to 81-85 kg earlier in November. Currently 76.3 kg (weight down with diuresis).  Patient meets criteria for severe malnutrition with severe depletion of muscle and subcutaneous fat mass.  NUTRITION - FOCUSED PHYSICAL EXAM:  Flowsheet Row Most Recent Value  Orbital Region Moderate depletion   Upper Arm Region Severe depletion  Thoracic and Lumbar Region Severe depletion  Buccal Region Moderate depletion  Temple Region Moderate depletion  Clavicle Bone Region Moderate depletion  Clavicle and Acromion Bone Region Moderate depletion  Scapular Bone Region Moderate depletion  Dorsal Hand Severe depletion  Patellar Region Severe depletion  Anterior Thigh Region Severe depletion  Posterior Calf Region Severe depletion  Edema (RD Assessment) Mild  Hair Reviewed  Eyes Reviewed  Mouth Reviewed  Skin Reviewed  Nails Reviewed       Diet Order:   Diet Order             DIET DYS 3 Room service appropriate? Yes; Fluid consistency: Thin  Diet effective now                   EDUCATION NEEDS:   No education needs have been identified at this time  Skin:  Skin Assessment: Reviewed RN Assessment (MASD to bilateral buttocks)  Last BM:  11/19  Height:   Ht Readings from Last 1 Encounters:  01/23/21 5\' 7"  (1.702 m)    Weight:   Wt Readings from Last 1 Encounters:  01/25/21 76.3 kg    BMI:  Body mass index is 26.35 kg/m.  Estimated Nutritional Needs:   Kcal:  2100-2300  Protein:  110-120 gm  Fluid:  2.1 L    Lucas Mallow, RD, LDN, CNSC Please refer to Amion for contact information.

## 2021-01-25 NOTE — Progress Notes (Addendum)
Post dose EKG is reviewed. SR 83bpm, MARKED 1st degree AVblock makes evaluation of his QT difficult though appears stable and acceptable Long 1st degree known for the patient in review of older EKGs Continue Tikosyn Agree with 1000 this AM plan for 1900 this evening > 0800 tomorrow  Pending K+ this AM Keep K+ 4.0 or better Mag 2.0 or better  Calc CrCl is 70, dose is appropriate  Tommye Standard, PA-C

## 2021-01-25 NOTE — Progress Notes (Signed)
Heart Failure Navigator Progress Note  Assessed for Heart & Vascular TOC clinic readiness.  Patient does not meet criteria due to AHF rounding team consulted this admission.   Navigator available for reassessment of patient.   Riah Kehoe, MSN, RN Heart Failure Nurse Navigator [phone]   

## 2021-01-25 NOTE — Progress Notes (Signed)
PROGRESS NOTE  [name] [name]  GGY:[id number] DOB: 1941/03/02 DOA: 01/23/2021 PCP: [name], MD  Outpatient Specialists: Cardiology, Dr. [name] Brief Narrative: HAIDAN NHAN is a 80 y.o. 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 He required 2L O2 and work up was consistent with pulmonary edema (bilateral R > L opacities on CXR, BNP 223, clinically overloaded). IV lasix was given with good diuresis. Due to prolonged QT interval, tikosyn was held that night but restarted the next morning on the advice of EP. IV antibiotics have been continued. Heart failure team is following the patient  Assessment & Plan: Principal Problem:   CHF exacerbation ([place]) Active Problems:   Hyperlipidemia with target LDL less than 130   Persistent atrial fibrillation (HCC)   Essential hypertension   Mitral valve disorder   Long term (current) use of anticoagulants  Acute hypoxic respiratory failure:  - Continue weaning attempts to room air with diuresis as below.   Acute on chronic HFpEF: Appears volume up, CVP is 10. Weight here 80.2kg. DC weight was 85kg > then documented at [facility] between 81 - 85kg, and 83.8kg at cardiology office 11/16. LVEF was 65-70% on recent echo.  - Continue lasix 40mg  IV BID, diuresing well. Appreciate recommendations from heart failure team.  - I/O, weight and CVP monitoring. Cr stable. - Sodium restriction.   Persistent atrial fibrillation, prolonged QT interval: Currently  NSR with long 1st deg AVB. - Ok to restart tikosyn per EP (ordered timed doses). - Monitor K, Mg, QT closely.  - Continue eliquis.  Hypokalemia: Associated with QT prolongation.  - Replace again today.  Enterococcus bacteremia with prosthetic MV endocarditis: Had +blood cultures 10/10, 10/11, 10/13, and negative on 10/17. Leukocytosis has improved, no fever or symptoms of uncontrolled infection at this time. - Continue ceftriaxone and ampicillin through 11/24 with plans to convert to indefinite amoxicillin thereafter. Followed by Dr. Linus Salmons.  Acute metabolic encephalopathy: Improving with treatment as above though continues to have delirium.  - TSH, ammonia normal, B12 elevated.  - EEG and MRI brain ordered per neurology recommendations, without acute findings.  Cerebrovascular disease, history of strokes: Right MCA June 2022 while holding eliquis due to SDH, and Left cerebellar vermis Oct 2022 ?related to bacteremia. CT head with chronic R MCA and L parietal territory infarcts and no acute changes. MRI without new findings this admission. - Continue eliquis (even with high risk hx SDH).   - Continue PT/OT due to persistent weakness - Continue dysphagia 3 diet due to persistent dysphagia. SLP evaluation requested. Dietitian also consulted.  Seizure disorder: Was changed from keppra to vimpat at recent admission due to concern for encephalopathy.  - Continue vimpat - EEG without seizure activity noted. Slowing around area of old stroke noted.   Tremors: No current evidence of ictal periods and tremor currently is better than it had been  - Per report, Dr. [name] reported decreasing amantadine dose by half and monitoring for 2 weeks. Has follow up scheduled 11/30 at 3pm.  HTN:  -  Continue norvasc  BPH:  - Continue tamsulosin, finasteride. UA without evidence of infection.  Anemia of chronic disease: Hgb low but at baseline. Anemia panel consistent with chronic disease with sufficient  ferritin and low TIBC/% sat.  - Monitor hgb, near his low baseline  HLD: LFTs wnl. - Continue statin  Ascending aortic aneurysm:  - Followed as outpatient.  DVT prophylaxis: Eliquis Code Status: DNR Family Communication: Wife at bedside, son by phone Disposition Plan:  Status is: Inpatient  Remains inpatient appropriate because: Needs supplemental oxygen, IV diuresis, further cardiac monitoring  Consultants:  Cardiology Palliative care Neurology  Procedures:  EEG   Antimicrobials: Ceftriaxone 2g q24h Ampicillin  2g q4h  Subjective: Slightly less interactive today, but appropriate. Shortness of breath slightly better, urinating a lot though CVP is up. No fever, abd pain, new weakness/numbness.   Objective: Vitals:   01/25/21 0159 01/25/21 0321 01/25/21 0900 01/25/21 0947  BP: (!) 101/52 (!) 148/88  115/87  Pulse:  88 82   Resp:  (!) 25 (!) 23   Temp:  98.3 F (36.8 C)    TempSrc:  Axillary    SpO2:  95% 97%   Weight:  76.3 kg    Height:        Intake/Output Summary (Last 24 hours) at 01/25/2021 1450 Last data filed at 01/25/2021 0535 Gross per 24 hour  Intake 240 ml  Output 2550 ml  Net -2310 ml   Filed Weights   01/23/21 2230 01/24/21 0500 01/25/21 0321  Weight: 80.2 kg 80.2 kg 76.3 kg   Gen: Chronically ill-appearing male in no acute distress Pulm: Nonlabored breathing supplemental oxygen. Clear. CV: Regular, prolonged PR on monitor, soft SEM without rub, or gallop. Elevated JVP, no significant dependent edema. GI: Abdomen soft, non-tender, non-distended, with normoactive bowel sounds.  Ext: Warm, no deformities Skin: No new rashes, lesions or ulcers on visualized skin. Neuro: Alert and incompletely oriented. Stable LUE > RUE weakness, diffuse tremor x4 extremities without new focal neurological deficits. Psych: Judgement and insight appear impaired. Mood euthymic & affect congruent. Behavior is appropriate.    Data Reviewed: I have personally  reviewed following labs and imaging studies  CBC: Recent Labs  Lab 01/20/21 1631 01/23/21 1355 01/24/21 0154 01/25/21 0623  WBC 15.1* 10.6* 10.0  --   HGB 8.2* 7.7* 7.3* 7.4*  HCT 26.8* 26.5* 23.3* 24.4*  MCV 90.5 91.7 88.3  --   PLT 448* 448* 395  --    Basic Metabolic Panel: Recent Labs  Lab 01/20/21 1631 01/23/21 1355 01/24/21 0154 01/24/21 0157 01/25/21 0623 01/25/21 0853  NA 140 141 142  --  143  --   K 3.6 3.0* 3.4*  --  3.6  --   CL 108 105 107  --  107  --   CO2 22 27 27   --  27  --   GLUCOSE 121* 120* 126*  --  93  --   BUN 14 12 11   --  11  --   CREATININE 0.92 0.93 0.94  --  0.94  --   CALCIUM 8.6* 8.4* 8.5*  --  8.2*  --   MG  --   --   --  1.9  --  2.4   GFR: Estimated Creatinine Clearance: 60.6 mL/min (by C-G formula based on SCr of 0.94 mg/dL). Liver Function Tests: Recent Labs  Lab 01/20/21 1631 01/23/21 1355  AST 27 37  ALT 35 34  ALKPHOS 115 107  BILITOT 0.6 0.7  PROT 6.9 6.9  ALBUMIN 2.5* 2.4*   No results for input(s): LIPASE, AMYLASE in the last 168 hours. Recent Labs  Lab 01/24/21 0157  AMMONIA 22   Coagulation Profile: Recent Labs  Lab 01/23/21 1355  INR 1.5*   Cardiac Enzymes: No results for input(s): CKTOTAL, CKMB, CKMBINDEX, TROPONINI in the last 168 hours. BNP (last 3 results) No results for input(s): PROBNP in the last 8760 hours. HbA1C: No results for input(s): HGBA1C in the last 72 hours. CBG: No results for input(s): GLUCAP in the last 168 hours. Lipid Profile: No results for input(s): CHOL, HDL, LDLCALC, TRIG, CHOLHDL, LDLDIRECT in the last 72 hours. Thyroid Function Tests: Recent Labs    01/24/21 0157  TSH 1.773   Anemia Panel: Recent Labs    01/24/21 0157 01/24/21 0158  VITAMINB12 956*  --   FOLATE  --  19.8  FERRITIN 238  --   TIBC 190*  --   IRON 20*  --   RETICCTPCT 2.5  --    Urine analysis:    Component Value Date/Time   COLORURINE YELLOW 01/23/2021 [place] 01/23/2021  1357   LABSPEC 1.009 01/23/2021 1357   PHURINE 7.0 01/23/2021 1357   GLUCOSEU NEGATIVE 01/23/2021 1357   HGBUR NEGATIVE 01/23/2021 1357   [place] 01/23/2021 1357   BILIRUBINUR negative 11/22/2013 0848   KETONESUR NEGATIVE 01/23/2021 1357   PROTEINUR NEGATIVE 01/23/2021 1357   UROBILINOGEN 0.2 11/22/2013 0848   UROBILINOGEN 1.0 08/05/2013 2207   NITRITE NEGATIVE 01/23/2021 1357   LEUKOCYTESUR NEGATIVE 01/23/2021 1357   Recent Results (from the past 240 hour(s))  Resp Panel by RT-PCR (Flu A&B, Covid) Nasopharyngeal Swab     Status: None   Collection Time: 01/23/21  2:32 PM   Specimen: Nasopharyngeal Swab; Nasopharyngeal(NP) swabs in vial transport medium  Result Value Ref Range Status   SARS Coronavirus 2 by RT PCR NEGATIVE NEGATIVE Final    Comment: (NOTE) SARS-CoV-2 target nucleic acids are NOT DETECTED.  The SARS-CoV-2 RNA is generally detectable in upper respiratory specimens during the acute phase of infection. The lowest concentration of SARS-CoV-2 viral copies this assay can detect is 138 copies/mL. A negative result does not preclude SARS-Cov-2 infection and should not be used as the sole basis for treatment or other patient management decisions. A negative result may occur with  improper specimen collection/handling, submission of specimen other than nasopharyngeal swab, presence of viral mutation(s) within the areas targeted by this assay, and inadequate number of viral copies(<138 copies/mL). A negative result must be combined with clinical observations, patient history, and epidemiological information. The expected result is Negative.  Fact Sheet for Patients:  EntrepreneurPulse.com.au  Fact Sheet for Healthcare Providers:  IncredibleEmployment.be  This test is no t yet approved or cleared by the [place] FDA and  has been authorized for detection and/or diagnosis of SARS-CoV-2 by FDA under an Emergency Use  Authorization (EUA). This EUA will remain  in effect (meaning this test can be used) for the duration of the COVID-19 declaration under Section 564(b)(1) of the Act, 21 U.S.C.section 360bbb-3(b)(1), unless the authorization is terminated  or revoked sooner.       Influenza A by PCR NEGATIVE NEGATIVE Final   Influenza B by PCR NEGATIVE NEGATIVE Final    Comment: (NOTE) The Xpert Xpress SARS-CoV-2/FLU/RSV plus assay is intended as an aid in the diagnosis of influenza from Nasopharyngeal swab specimens and should not be used as a sole basis for treatment. Nasal washings and  aspirates are unacceptable for Xpert Xpress SARS-CoV-2/FLU/RSV testing.  Fact Sheet for Patients: EntrepreneurPulse.com.au  Fact Sheet for Healthcare Providers: IncredibleEmployment.be  This test is not yet approved or cleared by the [place] FDA and has been authorized for detection and/or diagnosis of SARS-CoV-2 by FDA under an Emergency Use Authorization (EUA). This EUA will remain in effect (meaning this test can be used) for the duration of the COVID-19 declaration under Section 564(b)(1) of the Act, 21 U.S.C. section 360bbb-3(b)(1), unless the authorization is terminated or revoked.  Performed at [facility], [place] [address] [place], [place] [address]   MRSA Next Gen by PCR, Nasal     Status: None   Collection Time: 01/24/21  4:46 AM   Specimen: Nasal Mucosa; Nasal Swab  Result Value Ref Range Status   MRSA by PCR Next Gen NOT DETECTED NOT DETECTED Final    Comment: (NOTE) The GeneXpert MRSA Assay (FDA approved for NASAL specimens only), is one component of a comprehensive MRSA colonization surveillance program. It is not intended to diagnose MRSA infection nor to guide or monitor treatment for MRSA infections. Test performance is not FDA approved in patients less than 53 years old. Performed at [facility], [place] [address] [place],  [place] [address]       Radiology Studies: CT Head Wo Contrast  Result Date: 01/23/2021 CLINICAL DATA:  Stroke follow-up. EXAM: CT HEAD WITHOUT CONTRAST TECHNIQUE: Contiguous axial images were obtained from the base of the skull through the vertex without intravenous contrast. COMPARISON:  December 14, 2020 FINDINGS: Brain: A right MCA stroke with encephalomalacia is stable. Ventricular dilatation remains. No acute subdural, epidural, or subarachnoid hemorrhage identified. Cerebellum, brainstem, and basal cisterns are normal. White matter changes noted. No acute ischemia identified. A small posterior left parietal infarct, unchanged. No mass effect or midline shift. Vascular: Calcified atherosclerosis in the intracranial carotids. Skull: Previous right craniotomy. Sinuses/Orbits: No acute finding. Other: No other abnormalities. IMPRESSION: Chronic right MCA stroke. Chronic left posterior parietal stroke. No acute intracranial abnormality. Electronically Signed   By: Dorise Bullion III M.D.   On: 01/23/2021 15:46   MR BRAIN WO CONTRAST  Result Date: 01/24/2021 CLINICAL DATA:  Delirium EXAM: MRI HEAD WITHOUT CONTRAST TECHNIQUE: Multiplanar, multiecho pulse sequences of the brain and surrounding structures were obtained without intravenous contrast. COMPARISON:  CT head dated 1 day prior, brain MRI 12/18/2020 FINDINGS: Brain: There is significant artifact obscuring large portions of the axial DWI sequence including the entire posterior fossa. There is no evidence of acute ischemia on the coronal DWI sequence. There is no evidence of acute intracranial hemorrhage or acute extra-axial fluid collection. There is marked global parenchymal volume loss with enlargement of the ventricular system. There is an unchanged large right MCA territorial infarct with associated encephalomalacia and ex vacuo dilatation of the right lateral ventricle. An additional remote infarct in the left parietal lobe is unchanged. There is  extensive FLAIR signal abnormality throughout the subcortical and periventricular white matter likely reflecting sequela of advanced chronic white matter microangiopathy. There is a thin subdural collection overlying the right cerebral hemisphere underlying a craniotomy site which may reflect dural thickening or a tiny chronic subdural hematoma. There is SWI signal dropout in the right frontal lobe consistent with interval evolution of the previously seen intraparenchymal hematoma on the MRI of 12/18/2020. There are a few additional scattered punctate chronic microhemorrhages, nonspecific. There is no solid mass lesion.  There is no midline shift. Vascular: Normal flow voids.  Skull and upper cervical spine: Postsurgical changes reflecting right frontal parietal craniotomy are again seen. There is no suspicious marrow signal abnormality. Sinuses/Orbits: The paranasal sinuses are clear. Bilateral lens implants are in place. The globes and orbits are otherwise unremarkable. Other: None. IMPRESSION: 1. No evidence of acute intracranial pathology. 2. Interval expected evolution of the small intraparenchymal hematoma in the right frontal lobe since 12/18/2020. 3. Unchanged remote MCA territorial infarct and small remote infarct in the left parietal lobe. 4. 3 mm thick extra-axial signal abnormality overlying the right cerebral hemisphere deep to the craniotomy site may reflect a small chronic subdural hematoma or dural thickening. The appearance is not significantly changed since 12/18/2020. 5. Unchanged advanced global parenchymal volume loss and chronic white matter microangiopathy. Electronically Signed   By: Valetta Mole M.D.   On: 01/24/2021 16:53   EEG adult  Result Date: 01/24/2021 Lora Havens, MD     01/24/2021  6:24 PM Patient Name: YIGIT NORKUS MRN: [id number] Epilepsy Attending: Lora Havens Referring Physician/Provider: Dr Derrick Ravel Date: 01/24/2021 Duration: 23.17 mins Patient history:  80yo m with h/o right MCA stroke, epilepsy who presented with ams. EEG to evaluate for seizure Level of alertness: Awake AEDs during EEG study: LCM Technical aspects: This EEG study was done with scalp electrodes positioned according to the 10-20 International system of electrode placement. Electrical activity was acquired at a sampling rate of 500Hz  and reviewed with a high frequency filter of 70Hz  and a low frequency filter of 1Hz . EEG data were recorded continuously and digitally stored. Description: The posterior dominant rhythm consists of 8-9 Hz activity of moderate voltage (25-35 uV) seen predominantly in posterior head regions, symmetric and reactive to eye opening and eye closing. EEG showed continuous low amplitude 3 to 6 Hz theta-delta slowing in right frontotemporal region. Patient was noted to have right upper extremity tremor like movement  intermittently throughout the study. Concomitant eeg before, during and after the events didn't show any eeg change to suggest seizure Hyperventilation and photic stimulation were not performed.   ABNORMALITY - Continuous slow, right frontotemporal region. IMPRESSION: This study is suggestive of cortical dysfunction arising from  right frontotemporal region, nonspecific etiology but likely secondary to underlying stroke. No seizures or epileptiform discharges were seen throughout the recording. Patient was noted to have right upper extremity tremor like movement  intermittently throughout the study without concomitant eeg change. These episodes were most likely NOT epileptic. Priyanka Barbra Sarks    Scheduled Meds:  amantadine  100 mg Oral BID   amLODipine  10 mg Oral Daily   apixaban  5 mg Oral BID   atorvastatin  80 mg Oral Daily   Chlorhexidine Gluconate Cloth  6 each Topical Daily   docusate  100 mg Oral BID   dofetilide  375 mcg Oral Q12H   [START ON 01/26/2021] dofetilide  375 mcg Oral BID   finasteride  5 mg Oral Daily   furosemide  40 mg Intravenous  BID   lacosamide  50 mg Oral BID   loratadine  10 mg Oral QHS   magnesium gluconate  500 mg Oral Daily   multivitamin with minerals  1 tablet Oral Daily   polyvinyl alcohol  2 drop Both Eyes Daily   potassium chloride  40 mEq Oral BID   sennosides  5 mL Oral Daily   sodium chloride flush  10-40 mL Intracatheter Q12H   tamsulosin  0.4 mg Oral Daily   Continuous Infusions:  ampicillin (OMNIPEN)  IV 2 g (01/25/21 1344)   cefTRIAXone (ROCEPHIN)  IV 2 g (01/25/21 0845)     LOS: 2 days   Time spent: 35 minutes.  Patrecia Pour, MD [facility] [email or web address] 01/25/2021, 2:50 PM

## 2021-01-26 ENCOUNTER — Other Ambulatory Visit: Payer: Self-pay

## 2021-01-26 LAB — CBC
HCT: 25.9 % — ABNORMAL LOW (ref 39.0–52.0)
Hemoglobin: 7.9 g/dL — ABNORMAL LOW (ref 13.0–17.0)
MCH: 27.1 pg (ref 26.0–34.0)
MCHC: 30.5 g/dL (ref 30.0–36.0)
MCV: 88.7 fL (ref 80.0–100.0)
Platelets: 417 10*3/uL — ABNORMAL HIGH (ref 150–400)
RBC: 2.92 MIL/uL — ABNORMAL LOW (ref 4.22–5.81)
RDW: 16.3 % — ABNORMAL HIGH (ref 11.5–15.5)
WBC: 10.4 10*3/uL (ref 4.0–10.5)
nRBC: 0 % (ref 0.0–0.2)

## 2021-01-26 LAB — BASIC METABOLIC PANEL
Anion gap: 13 (ref 5–15)
BUN: 12 mg/dL (ref 8–23)
CO2: 26 mmol/L (ref 22–32)
Calcium: 8.7 mg/dL — ABNORMAL LOW (ref 8.9–10.3)
Chloride: 104 mmol/L (ref 98–111)
Creatinine, Ser: 0.95 mg/dL (ref 0.61–1.24)
GFR, Estimated: >60 mL/min (ref >60)
Glucose, Bld: 105 mg/dL — ABNORMAL HIGH (ref 70–99)
Potassium: 3.8 mmol/L (ref 3.5–5.1)
Sodium: 143 mmol/L (ref 135–145)

## 2021-01-26 LAB — MAGNESIUM: Magnesium: 2.2 mg/dL (ref 1.7–2.4)

## 2021-01-26 MED ORDER — TORSEMIDE 20 MG PO TABS
20.0000 mg | ORAL_TABLET | Freq: Every day | ORAL | Status: DC
  Administered 2021-01-27: 20 mg via ORAL
  Filled 2021-01-26 (×2): qty 1

## 2021-01-26 MED ORDER — DOFETILIDE 250 MCG PO CAPS
375.0000 ug | ORAL_CAPSULE | Freq: Two times a day (BID) | ORAL | Status: DC
  Administered 2021-01-26 – 2021-01-27 (×2): 375 ug via ORAL
  Filled 2021-01-26 (×4): qty 1

## 2021-01-26 MED ORDER — BISACODYL 10 MG RE SUPP
10.0000 mg | Freq: Once | RECTAL | Status: AC
  Administered 2021-01-26: 10 mg via RECTAL
  Filled 2021-01-26: qty 1

## 2021-01-26 MED ORDER — POTASSIUM CHLORIDE CRYS ER 20 MEQ PO TBCR
40.0000 meq | EXTENDED_RELEASE_TABLET | Freq: Once | ORAL | Status: AC
  Administered 2021-01-26: 40 meq via ORAL

## 2021-01-26 MED ORDER — SENNOSIDES 8.8 MG/5ML PO SYRP
15.0000 mL | ORAL_SOLUTION | Freq: Two times a day (BID) | ORAL | Status: DC
  Administered 2021-01-26: 15 mL via ORAL
  Filled 2021-01-26 (×3): qty 15

## 2021-01-26 NOTE — Progress Notes (Signed)
[name] [name] is from [place]. CSW will need physical therapy recommendations if the plan is for rehab at a SNF. CSW will contact [facility] tomorrow to verify and speak with [name] at bedside. If PT doesn't see and what plan will be if just returning to [place] without rehab. CSW to follow up tomorrow.  Amado Andal, MSW, [place] Heart Failure Social Worker

## 2021-01-26 NOTE — Progress Notes (Signed)
PROGRESS NOTE  [name] [name]  WUX:[id number] DOB: 05-Jul-1940 DOA: 01/23/2021 PCP: [name], MD  Outpatient Specialists: Cardiology, Dr. [name] Brief Narrative: [name] [name] is a 80 y.o. 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 He required 2L O2 and work up was consistent with pulmonary edema (bilateral R > L opacities on CXR, BNP 223, clinically overloaded). IV lasix was given with good diuresis. Due to prolonged QT interval, tikosyn was held that night but restarted the next morning on the advice of EP. IV antibiotics have been continued. Heart failure team is following the patient and diuresis is underway.  Assessment & Plan: Principal Problem:   CHF exacerbation ([place]) Active Problems:   Hyperlipidemia with target LDL less than 130   Persistent atrial fibrillation (HCC)   Essential hypertension   Mitral valve disorder   Long term (current) use of anticoagulants  Acute hypoxic respiratory failure:  - Continue weaning attempts to room air with diuresis as below.   Acute on chronic HFpEF: Appears volume up, CVP is 10. Weight here 80.2kg. DC weight was 85kg > then documented at [facility] between 81 - 85kg, and 83.8kg at cardiology office 11/16. LVEF was 65-70% on recent echo.  - Continue lasix 40mg  IV BID today, CVP down, volume status clinically improving. May transition to po torsemide in AM and be stable for discharge per heart failure team.  - I/O, weight and CVP monitoring. Cr  stable. - Sodium restriction.   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 - Continuing tikosyn. This is being managed by EP. - Monitor K, Mg, QTc closely.  - Continue eliquis.  Hypokalemia: Associated with QT prolongation.  - Replace again today since <4.  Enterococcus bacteremia with prosthetic MV endocarditis: Had +blood cultures 10/10, 10/11, 10/13, and negative on 10/17. Leukocytosis has improved, no fever or symptoms of uncontrolled infection at this time. - Continue ceftriaxone and ampicillin through 11/24 with plans to convert to indefinite amoxicillin thereafter. Followed by Dr. Linus Salmons.  Acute metabolic encephalopathy: Improving with treatment as above though continues to have delirium.  - TSH, ammonia normal, B12 elevated.  - EEG and MRI brain ordered per neurology recommendations, without acute findings.  Cerebrovascular disease, history of strokes: Right MCA June 2022 while holding eliquis due to SDH, and Left cerebellar vermis Oct 2022 ?related to bacteremia. CT head with chronic R MCA and L parietal territory infarcts and no acute changes. MRI without new findings this admission. - Continue eliquis (even with high risk hx SDH).   - Continue PT/OT due to persistent weakness - Continue dysphagia 3 diet due to persistent dysphagia. SLP evaluation requested. Dietitian also consulted.  Constipation: Acute on chronic, LBM 11/19. Abd benign.  - continue senna, ducosate, miralax. Add dulcolax suppository today. If no improvement, can give enema. Pt at high risk for ileus.  Seizure disorder: Was changed from keppra to vimpat at recent admission due to concern for encephalopathy.  - Continue vimpat - EEG without seizure activity noted. Slowing  around area of old stroke noted.   Tremors: No current evidence of ictal periods and tremor currently is better than it had been  - Per report, Dr. [name] reported decreasing amantadine dose by  half and monitoring for 2 weeks. Has follow up scheduled 11/30 at 3pm.  HTN:  - Continue norvasc  BPH:  - Continue tamsulosin, finasteride. UA without evidence of infection.  Anemia of chronic disease: Hgb low but at baseline. Anemia panel consistent with chronic disease with sufficient ferritin and low TIBC/% sat.  - Monitor hgb, near his low baseline  HLD: LFTs wnl. - Continue statin  Ascending aortic aneurysm:  - Followed as outpatient.  DVT prophylaxis: Eliquis Code Status: DNR Family Communication: Wife at bedside, son by phone Disposition Plan:  Status is: Inpatient  Remains inpatient appropriate because: Needs supplemental oxygen, IV diuresis, further cardiac monitoring. May be eligible for discharge 11/23.  Consultants:  Cardiology Palliative care Neurology  Procedures:  EEG   Antimicrobials: Ceftriaxone 2g q24h Ampicillin  2g q4h  Subjective: More interactive today, no complaints. Has not had BM in a couple days per spouse.   Objective: Vitals:   01/25/21 2109 01/25/21 2232 01/26/21 0332 01/26/21 1302  BP: 127/84 127/84 113/75 130/78  Pulse: 94 94 82 94  Resp: 20 20 19 20   Temp: 97.8 F (36.6 C) 97.8 F (36.6 C) 97.7 F (36.5 C)   TempSrc:   Oral   SpO2:   99% 91%  Weight:   73.7 kg   Height:        Intake/Output Summary (Last 24 hours) at 01/26/2021 1529 Last data filed at 01/26/2021 2979 Gross per 24 hour  Intake 1071.98 ml  Output 2350 ml  Net -1278.02 ml   Filed Weights   01/24/21 0500 01/25/21 0321 01/26/21 0332  Weight: 80.2 kg 76.3 kg 73.7 kg   Gen: Elderly, frail male in no distress Pulm: Nonlabored. Clear. CV: Regular rate and rhythm. No murmur, rub, or gallop. Improved JVD, no pitting dependent edema. GI: Abdomen soft, protuberant but not taut with very active bowel sounds, non-tender   Ext: Warm, dry, no deformities Skin: No new rashes, lesions or ulcers on visualized skin. Neuro: Alert and incompletely oriented,  bradyphrenic with stable focal neurological deficits. Psych: Judgement and insight appear marginal/impaired. Mood euthymic & affect congruent. Behavior is appropriate.    Data Reviewed: I have personally reviewed following labs and imaging studies  CBC: Recent Labs  Lab 01/20/21 1631 01/23/21 1355 01/24/21 0154 01/25/21 0623 01/26/21 0435  WBC 15.1* 10.6* 10.0  --  10.4  HGB 8.2* 7.7* 7.3* 7.4* 7.9*  HCT 26.8* 26.5* 23.3* 24.4* 25.9*  MCV 90.5 91.7 88.3  --  88.7  PLT 448* 448* 395  --  892*   Basic Metabolic Panel: Recent Labs  Lab 01/20/21 1631 01/23/21 1355 01/24/21 0154 01/24/21 0157 01/25/21 0623 01/25/21 0853 01/26/21 0435  NA 140 141 142  --  143  --  143  K 3.6 3.0* 3.4*  --  3.6  --  3.8  CL 108 105 107  --  107  --  104  CO2 22 27 27   --  27  --  26  GLUCOSE 121* 120* 126*  --  93  --  105*  BUN 14 12 11   --  11  --  12  CREATININE 0.92 0.93 0.94  --  0.94  --  0.95  CALCIUM 8.6* 8.4* 8.5*  --  8.2*  --  8.7*  MG  --   --   --  1.9  --  2.4 2.2   GFR: Estimated Creatinine Clearance: 59.9 mL/min (by C-G formula based on SCr of 0.95 mg/dL). Liver Function Tests: Recent Labs  Lab 01/20/21 1631 01/23/21 1355  AST 27 37  ALT 35 34  ALKPHOS 115 107  BILITOT 0.6 0.7  PROT 6.9 6.9  ALBUMIN 2.5* 2.4*   No results for input(s): LIPASE, AMYLASE in the last 168 hours. Recent Labs  Lab 01/24/21 0157  AMMONIA 22   Coagulation Profile: Recent Labs  Lab 01/23/21 1355  INR 1.5*   Cardiac Enzymes: No results for input(s): CKTOTAL, CKMB, CKMBINDEX, TROPONINI in the last 168 hours. BNP (last 3 results) No results for input(s): PROBNP in the last 8760 hours. HbA1C: No results for input(s): HGBA1C in the last 72 hours. CBG: No results for input(s): GLUCAP in the last 168 hours. Lipid Profile: No results for input(s): CHOL, HDL, LDLCALC, TRIG, CHOLHDL, LDLDIRECT in the last 72 hours. Thyroid Function Tests: Recent Labs    01/24/21 0157  TSH 1.773    Anemia Panel: Recent Labs    01/24/21 0157 01/24/21 0158  VITAMINB12 956*  --   FOLATE  --  19.8  FERRITIN 238  --   TIBC 190*  --   IRON 20*  --   RETICCTPCT 2.5  --    Urine analysis:    Component Value Date/Time   COLORURINE YELLOW 01/23/2021 [place] 01/23/2021 1357   LABSPEC 1.009 01/23/2021 1357   PHURINE 7.0 01/23/2021 1357   GLUCOSEU NEGATIVE 01/23/2021 1357   HGBUR NEGATIVE 01/23/2021 1357   [place] 01/23/2021 1357   BILIRUBINUR negative 11/22/2013 0848   KETONESUR NEGATIVE 01/23/2021 1357   PROTEINUR NEGATIVE 01/23/2021 1357   UROBILINOGEN 0.2 11/22/2013 0848   UROBILINOGEN 1.0 08/05/2013 2207   NITRITE NEGATIVE 01/23/2021 1357   LEUKOCYTESUR NEGATIVE 01/23/2021 1357   Recent Results (from the past 240 hour(s))  Resp Panel by RT-PCR (Flu A&B, Covid) Nasopharyngeal Swab     Status: None   Collection Time: 01/23/21  2:32 PM   Specimen: Nasopharyngeal Swab; Nasopharyngeal(NP) swabs in vial transport medium  Result Value Ref Range Status   SARS Coronavirus 2 by RT PCR NEGATIVE NEGATIVE Final    Comment: (NOTE) SARS-CoV-2 target nucleic acids are NOT DETECTED.  The SARS-CoV-2 RNA is generally detectable in upper respiratory specimens during the acute phase of infection. The lowest concentration of SARS-CoV-2 viral copies this assay can detect is 138 copies/mL. A negative result does not preclude SARS-Cov-2 infection and should not be used as the sole basis for treatment or other patient management decisions. A negative result may occur with  improper specimen collection/handling, submission of specimen other than nasopharyngeal swab, presence of viral mutation(s) within the areas targeted by this assay, and inadequate number of viral copies(<138 copies/mL). A negative result must be combined with clinical observations, patient history, and epidemiological information. The expected result is Negative.  Fact Sheet for Patients:   EntrepreneurPulse.com.au  Fact Sheet for Healthcare Providers:  IncredibleEmployment.be  This test is no t yet approved or cleared by the [place] FDA and  has been authorized for detection and/or diagnosis of SARS-CoV-2 by FDA under an Emergency Use Authorization (EUA). This EUA will remain  in effect (meaning this test can be used) for the duration of the COVID-19 declaration under Section 564(b)(1) of the Act, 21 U.S.C.section 360bbb-3(b)(1), unless the authorization is terminated  or revoked sooner.  Influenza A by PCR NEGATIVE NEGATIVE Final   Influenza B by PCR NEGATIVE NEGATIVE Final    Comment: (NOTE) The Xpert Xpress SARS-CoV-2/FLU/RSV plus assay is intended as an aid in the diagnosis of influenza from Nasopharyngeal swab specimens and should not be used as a sole basis for treatment. Nasal washings and aspirates are unacceptable for Xpert Xpress SARS-CoV-2/FLU/RSV testing.  Fact Sheet for Patients: EntrepreneurPulse.com.au  Fact Sheet for Healthcare Providers: IncredibleEmployment.be  This test is not yet approved or cleared by the [place] FDA and has been authorized for detection and/or diagnosis of SARS-CoV-2 by FDA under an Emergency Use Authorization (EUA). This EUA will remain in effect (meaning this test can be used) for the duration of the COVID-19 declaration under Section 564(b)(1) of the Act, 21 U.S.C. section 360bbb-3(b)(1), unless the authorization is terminated or revoked.  Performed at [facility], [place] [address], [place], [place] [address]   MRSA Next Gen by PCR, Nasal     Status: None   Collection Time: 01/24/21  4:46 AM   Specimen: Nasal Mucosa; Nasal Swab  Result Value Ref Range Status   MRSA by PCR Next Gen NOT DETECTED NOT DETECTED Final    Comment: (NOTE) The GeneXpert MRSA Assay (FDA approved for NASAL specimens only), is one component of a  comprehensive MRSA colonization surveillance program. It is not intended to diagnose MRSA infection nor to guide or monitor treatment for MRSA infections. Test performance is not FDA approved in patients less than 30 years old. Performed at [facility], [place] [address] [place], [place] [address]       Radiology Studies: No results found.  Scheduled Meds:  amantadine  100 mg Oral BID   amLODipine  10 mg Oral Daily   apixaban  5 mg Oral BID   atorvastatin  80 mg Oral Daily   Chlorhexidine Gluconate Cloth  6 each Topical Daily   docusate  100 mg Oral BID   dofetilide  375 mcg Oral BID   finasteride  5 mg Oral Daily   lacosamide  50 mg Oral BID   loratadine  10 mg Oral QHS   magnesium gluconate  500 mg Oral Daily   multivitamin with minerals  1 tablet Oral Daily   polyvinyl alcohol  2 drop Both Eyes Daily   sennosides  15 mL Oral BID   sodium chloride flush  10-40 mL Intracatheter Q12H   tamsulosin  0.4 mg Oral Daily   [START ON 01/27/2021] torsemide  20 mg Oral Daily   Continuous Infusions:  ampicillin (OMNIPEN) IV 2 g (01/26/21 1411)   cefTRIAXone (ROCEPHIN)  IV 2 g (01/26/21 1100)     LOS: 3 days   Time spent: 35 minutes.  Patrecia Pour, MD [facility] [email or web address] 01/26/2021, 3:29 PM

## 2021-01-26 NOTE — Progress Notes (Addendum)
Advanced Heart Failure Rounding Note  PCP-Cardiologist: Loralie Champagne, MD   Subjective:    Admitted for a/c diastolic CHF. Started on IV Lasix.   Down another 6 lb overnight with IV lasix, 22 lb this admit. ? Is/Os.  CVP 8 today  SCr 0.95 K 3.8 Mg 2.2  Lying comfortably in bed. Wife is present.   EP following and assisting w/ Tikosyn dosing.  Neurology also following for ? Seizures. No seizures or epileptiform activity on EEG 11/20    Objective:   Weight Range: 73.7 kg Body mass index is 25.45 kg/m.   Vital Signs:   Temp:  [97.7 F (36.5 C)-98 F (36.7 C)] 97.7 F (36.5 C) (11/22 0332) Pulse Rate:  [64-94] 82 (11/22 0332) Resp:  [19-28] 19 (11/22 0332) BP: (104-127)/(65-87) 113/75 (11/22 0332) SpO2:  [92 %-100 %] 99 % (11/22 0332) Weight:  [73.7 kg] 73.7 kg (11/22 0332) Last BM Date: 01/23/21  Weight change: Filed Weights   01/24/21 0500 01/25/21 0321 01/26/21 0332  Weight: 80.2 kg 76.3 kg 73.7 kg    Intake/Output:   Intake/Output Summary (Last 24 hours) at 01/26/2021 0912 Last data filed at 01/26/2021 1194 Gross per 24 hour  Intake 1271.98 ml  Output 2350 ml  Net -1078.02 ml      Physical Exam    General:  Elderly white male, lying in bed. No distress. HEENT: Normal Neck: Supple. JVP 8 cm. Carotids 2+ bilat; no bruits.  Cor: PMI nondisplaced. Regular rate & rhythm. No rubs, gallops or murmurs. Lungs: CTA Abdomen: Soft, nontender, nondistended. Extremities: No cyanosis, clubbing, rash, edema Neuro: Alert. Able to formulate short responses. Moving all 4 extremities w/o difficulty. 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  Labs    CBC Recent Labs    01/24/21 0154 01/25/21 0623 01/26/21 0435  WBC 10.0  --  10.4  HGB 7.3* 7.4* 7.9*  HCT 23.3* 24.4* 25.9*  MCV 88.3  --  88.7  PLT 395  --  174*   Basic Metabolic  Panel Recent Labs    01/25/21 0623 01/25/21 0853 01/26/21 0435  NA 143  --  143  K 3.6  --  3.8  CL 107  --  104  CO2 27  --  26  GLUCOSE 93  --  105*  BUN 11  --  12  CREATININE 0.94  --  0.95  CALCIUM 8.2*  --  8.7*  MG  --  2.4 2.2   Liver Function Tests Recent Labs    01/23/21 1355  AST 37  ALT 34  ALKPHOS 107  BILITOT 0.7  PROT 6.9  ALBUMIN 2.4*   No results for input(s): LIPASE, AMYLASE in the last 72 hours. Cardiac Enzymes No results for input(s): CKTOTAL, CKMB, CKMBINDEX, TROPONINI in the last 72 hours.  BNP: BNP (last 3 results) Recent Labs    01/08/21 0000 01/20/21 1631 01/23/21 1355  BNP 319.00 286.6* 223.5*    ProBNP (last 3 results) No results for input(s): PROBNP in the last 8760 hours.   D-Dimer No results for input(s): DDIMER in the last 72 hours. Hemoglobin A1C No results for input(s): HGBA1C in the last 72 hours. Fasting Lipid Panel No results for input(s): CHOL, HDL, LDLCALC, TRIG, CHOLHDL, LDLDIRECT in the last 72 hours. Thyroid Function Tests Recent Labs  01/24/21 0157  TSH 1.773    Other results:   Imaging    No results found.   Medications:     Scheduled Medications:  amantadine  100 mg Oral BID   amLODipine  10 mg Oral Daily   apixaban  5 mg Oral BID   atorvastatin  80 mg Oral Daily   Chlorhexidine Gluconate Cloth  6 each Topical Daily   docusate  100 mg Oral BID   dofetilide  375 mcg Oral BID   finasteride  5 mg Oral Daily   furosemide  40 mg Intravenous BID   lacosamide  50 mg Oral BID   loratadine  10 mg Oral QHS   magnesium gluconate  500 mg Oral Daily   multivitamin with minerals  1 tablet Oral Daily   polyvinyl alcohol  2 drop Both Eyes Daily   sennosides  5 mL Oral Daily   sodium chloride flush  10-40 mL Intracatheter Q12H   tamsulosin  0.4 mg Oral Daily    Infusions:  ampicillin (OMNIPEN) IV 2 g (01/26/21 0908)   cefTRIAXone (ROCEPHIN)  IV 2 g (01/25/21 2128)    PRN  Medications: acetaminophen **OR** acetaminophen, polyethylene glycol, sodium chloride flush    Assessment/Plan   1. Acute on Chronic Diastolic Heart Failure: Most recent echo 10/22 EF 65-70%. Admitted w/ volume overload and dyspnea. Responding well to IV Lasix. CVP 8. Down 22 lb. Will give one more dose IV lasix and restart po torsemide at 20 mg daily tomorrow -Supp K 2. 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 Qtc read as 534 ms on ECG this am, appears shorter than this (? 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 Dr. [name] discussed with EP - continue Tikosyn - continue Eliquis 5 mg bid - Keep K > 4.0 and Mg > 2.0  3. 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 Neuro consulted this admit for ? Seizures. EEG was negative  4. H/o Enterococcal bacteremia: Source uncertain.  No PNA noted on CXR.  Enterococcus did not grow in urine.  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  He has a new left cerebellar CVA (small) that may be embolic from MV vegetation.  Blood cultures from 10/10, 10/11, 10/13 were positive.  Cultures from 10/15 with S epidermidis, likely contaminant.  Resent 10/17, these cultures remained negative.  - Continues on ceftriaxone and ampicillin.  - Not candidate for cardiac surgery, management will have to be medical.  5. 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  Echo 10/22 with EF 65-70%, normal RV, bioprosthetic AVR mean gradient 16 mmHg appears normal, bioprosthetic MV mean gradient 10, increased from prior but HR also in 100s when study done.  TEE as above showed bioprosthetic MV vegetation with trivial MR and mean gradient 8.   6. Anemia: chronic. Baseline Hgb 7-8 range. 7.9 today  - management per primary team   [place] -Palliative care following. DNR/DNI   Dispostion -Potentially stable for discharge from HF perspective tomorrow -Residing in skilled nursing facility PTA. TOC consult.  Length of Stay: 3  FINCH, LINDSAY N, PA-C  01/26/2021, 9:12 AM  Advanced Heart Failure Team Pager [phone] (M-F; 7a - 5p)  Please contact [facility] for night-coverage after hours (5p -7a ) and weekends on [email or web address]  Patient seen with PA, agree with the above note.   Good diuresis yesterday, weight down 6 lbs.  Creatinine stable. CVP 8.   He is in NSR with long 1st degree.  He has periodic type 1 2nd degree AVB.    General: NAD Neck: JVP 8 cm, no thyromegaly or thyroid nodule.  Lungs: Clear to auscultation bilaterally with normal respiratory effort. CV: Nondisplaced PMI.  Heart regular S1/S2, no S3/S4, no murmur.  No peripheral edema.   Abdomen: Soft, nontender, no hepatosplenomegaly, no distention.  Skin: Intact without lesions or rashes.  Neurologic: Alert and oriented x 3.  Psych: Normal affect. Extremities: No clubbing or cyanosis.  HEENT: Normal.   Patient is diuresing well, acute on chronic diastolic CHF.   - Give 1 more dose Lasix 40 mg IV this morning, start on torsemide 20 mg daily tomorrow.   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  He is not on nodal blockers.  I reviewed ECG today with EP, think we can continue Tikosyn (QTc is not prolonged).   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    Stable low hgb, no overt bleeding.  Per primary.   I think he should be ready for discharge tomorrow as long as rhythm and volume remain stable.   Loralie Champagne 01/26/2021 9:53 AM

## 2021-01-26 NOTE — Progress Notes (Signed)
Telemetry reviewed SR, marked 1st degree AVblock, occ PVC, one couplet noted Post dose (#4) EKG from last night I suspect this is his SR (looking at his tele and older EKGs) Though accelerated junctional rhythm perhaps 93bpm QT (including P wave) 360ms, QTc 463ms (agree with machine read)  OK for [employer] are stable K+ steadily improving with current supplementation Mag OK Creat stable   D/w Dr. Rayann Heman He will review as well  EP will remain available, please recall if needed  Tommye Standard, PA-C

## 2021-01-27 ENCOUNTER — Telehealth: Payer: Self-pay

## 2021-01-27 ENCOUNTER — Other Ambulatory Visit: Payer: Self-pay

## 2021-01-27 DIAGNOSIS — I5033 Acute on chronic diastolic (congestive) heart failure: Secondary | ICD-10-CM

## 2021-01-27 LAB — BASIC METABOLIC PANEL
Anion gap: 8 (ref 5–15)
BUN: 15 mg/dL (ref 8–23)
CO2: 25 mmol/L (ref 22–32)
Calcium: 8.7 mg/dL — ABNORMAL LOW (ref 8.9–10.3)
Chloride: 107 mmol/L (ref 98–111)
Creatinine, Ser: 1.06 mg/dL (ref 0.61–1.24)
GFR, Estimated: >60 mL/min (ref >60)
Glucose, Bld: 137 mg/dL — ABNORMAL HIGH (ref 70–99)
Potassium: 3.5 mmol/L (ref 3.5–5.1)
Sodium: 140 mmol/L (ref 135–145)

## 2021-01-27 LAB — ECHO TEE
AV Mean grad: 14 mmHg
AV Peak grad: 24.2 mmHg
Ao pk vel: 2.46 m/s

## 2021-01-27 LAB — RESP PANEL BY RT-PCR (FLU A&B, COVID) ARPGX2
Influenza A by PCR: NEGATIVE
Influenza B by PCR: NEGATIVE
SARS Coronavirus 2 by RT PCR: NEGATIVE

## 2021-01-27 LAB — MAGNESIUM: Magnesium: 2.4 mg/dL (ref 1.7–2.4)

## 2021-01-27 MED ORDER — AMOXICILLIN 400 MG/5ML PO SUSR: 500.0000 mg | Freq: Two times a day (BID) | ORAL | Status: AC

## 2021-01-27 MED ORDER — POTASSIUM CHLORIDE CRYS ER 10 MEQ PO TBCR: 20.0000 meq | EXTENDED_RELEASE_TABLET | Freq: Every day | ORAL | Status: DC

## 2021-01-27 MED ORDER — POTASSIUM CHLORIDE CRYS ER 20 MEQ PO TBCR
40.0000 meq | EXTENDED_RELEASE_TABLET | Freq: Once | ORAL | Status: DC
  Filled 2021-01-27: qty 2

## 2021-01-27 MED ORDER — BISACODYL 10 MG RE SUPP: 10.0000 mg | Freq: Every day | RECTAL | Status: DC | PRN

## 2021-01-27 MED ORDER — POTASSIUM CHLORIDE 20 MEQ PO PACK
40.0000 meq | PACK | Freq: Once | ORAL | Status: AC
  Administered 2021-01-27: 40 meq via ORAL
  Filled 2021-01-27: qty 2

## 2021-01-27 MED ORDER — AMPICILLIN IV (FOR PTA / DISCHARGE USE ONLY): 2.0000 g | INTRAVENOUS | Status: AC

## 2021-01-27 MED ORDER — AMOXICILLIN 400 MG/5ML PO SUSR: 500.0000 mg | Freq: Two times a day (BID) | ORAL | 3 refills | Status: DC

## 2021-01-27 MED ORDER — POLYETHYLENE GLYCOL 3350 17 G PO PACK: 17.0000 g | PACK | Freq: Every day | ORAL | Status: DC

## 2021-01-27 MED ORDER — AMOXICILLIN 500 MG PO TABS: 500.0000 mg | ORAL_TABLET | Freq: Two times a day (BID) | ORAL | 3 refills | Status: DC

## 2021-01-27 MED ORDER — SENNOSIDES-DOCUSATE SODIUM 8.6-50 MG PO TABS: 1.0000 | ORAL_TABLET | Freq: Two times a day (BID) | ORAL | Status: DC

## 2021-01-27 NOTE — Progress Notes (Signed)
Attempted to call report, no answer. Will try again later.

## 2021-01-27 NOTE — Consult Note (Addendum)
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  TEE had revealed vegetation possibly and leaflets of mitral valve consistent with prosthetic mitral valve endocarditis, patient is followed by infectious disease Dr. Linus Salmons.  Pt is set to complete 6 weeks week of antibiotics with ampicillin+ceftriaxone x 6 week(EOT 11/24) followed by amoxicillin 500 mg p.o. twice daily for chronic suppression.  Readmitted on 01/23/2021 for acute on chronic heart failure.  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  - After completion of IV therapy start amoxicillin 500mg  PO bid indefinitely(Liquid form, will make note if it could be flavored) - ID will sign off, follow-up with Infectious Disease outpatient with Dr. Candiss [name] Microbiology:   Antibiotics: Ampicillin   10/11-p Ceftriaxone 10/10-p  HPI: [name] [name] is a 80 y.o. 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  TEE revealed vegetation on the sewing ring and leaflets of the mitral valve  consistent with prosthetic mitral endocarditis.  Patient was started on ampicillin and ceftriaxone x6 weeks( EOT 11/24) followed by plan for chronic suppression with amoxicillin 500 mg twice daily indefinitely due to prostatic valve retention.  He is followed by Dr. Linus Salmons, Infectious Disease.   On 01/23/2021 admitted for acute respiratory failure secondary to acute on chronic heart failure.  Patient has had a 20 pound weight gain over the past month with increasing dyspnea. Patient was diuresed, IV antibiotics continued.  ID consulted as IV antibiotics were scheduled to end on 11/24 for  questions regarding chronic suppression. Today, patient is resting in bed.  Wife is at bedside.  She reports she would like amoxicillin in liquid form with flavor if possible.  Otherwise no new complaints.  Review of Systems: Review of Systems  All other systems reviewed and are negative.  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    Other and unspecified hyperlipidemia    Pre-diabetes    Previous back surgery 1978, jan 2007   Psychosexual dysfunction  with inhibited sexual excitement    SOB (shortness of breath)    "with heavy exercise"   Stroke ([place]) 08/2013   "I WAS IN AFIB AND THREW A CLOT, THE EFFECTS WERE TRANSITORY AND DIDNT LAST BUT FOR 30 MINUTES"    Thoracic aortic aneurysm     Social History   Tobacco Use   Smoking status: Never   Smokeless tobacco: Never  Vaping Use   Vaping Use: Never used  Substance Use  Topics   Alcohol use: Never    Comment: wine occasionally   Drug use: Never    Family History  Problem Relation Age of Onset   Macular degeneration Mother        macular degeneration   Cancer Father        intestinal/GI   Diabetes Paternal Aunt    Heart attack Maternal Grandfather    Diabetes Brother    Scheduled Meds:  amantadine  100 mg Oral BID   amLODipine  10 mg Oral Daily   apixaban  5 mg Oral BID   atorvastatin  80 mg Oral Daily   Chlorhexidine Gluconate Cloth  6 each Topical Daily   docusate  100 mg Oral BID   dofetilide  375 mcg Oral BID   finasteride  5 mg Oral Daily   lacosamide  50 mg Oral BID   loratadine  10 mg Oral QHS   magnesium gluconate  500 mg Oral Daily   multivitamin with minerals  1 tablet Oral Daily   polyvinyl alcohol  2 drop Both Eyes Daily   sennosides  15 mL Oral BID   sodium chloride flush  10-40 mL Intracatheter Q12H   tamsulosin  0.4 mg Oral Daily   torsemide  20 mg Oral Daily   Continuous Infusions:  ampicillin (OMNIPEN) IV 2 g (01/27/21 1452)   cefTRIAXone (ROCEPHIN)  IV 2 g (01/27/21 1139)   PRN Meds:.acetaminophen **OR** acetaminophen, polyethylene glycol, sodium chloride flush Allergies  Allergen Reactions   Naproxen Hives   No [employer] Food Allergies Other (See Comments)    Scallops - distress, nausea and vomitting    OBJECTIVE: Blood pressure (!) 128/102, pulse 90, temperature (!) 97.2 F (36.2 C), temperature source Oral, resp. rate 20, height 5\' 7"  (1.702 m), weight 60.4 kg, SpO2 100 %.  Physical Exam Constitutional:      General: He is not in acute distress.    Appearance: He is normal weight. He is not toxic-appearing.  HENT:     Head: Normocephalic and atraumatic.     Right Ear: External ear normal.     Left Ear: External ear normal.     Nose: No congestion or rhinorrhea.     Mouth/Throat:     Mouth: Mucous membranes are moist.     Pharynx: Oropharynx is clear.  Eyes:     Extraocular Movements: Extraocular  movements intact.     Conjunctiva/sclera: Conjunctivae normal.     Pupils: Pupils are equal, round, and reactive to light.  Cardiovascular:     Rate and Rhythm: Normal rate.     Heart sounds:    No friction rub. No gallop.  Pulmonary:     Effort: Pulmonary effort is normal.     Breath sounds: Normal breath sounds.  Abdominal:     General: Abdomen is flat. Bowel sounds are normal.     Palpations: Abdomen is soft.  Musculoskeletal:        General: No swelling. Normal range of motion.  Cervical back: Normal range of motion and neck supple.  Skin:    General: Skin is warm and dry.  Neurological:     General: No focal deficit present.     Mental Status: He is oriented to person, place, and time.  Psychiatric:        Mood and Affect: Mood normal.    Lab Results Lab Results  Component Value Date   WBC 10.4 01/26/2021   HGB 7.9 (L) 01/26/2021   HCT 25.9 (L) 01/26/2021   MCV 88.7 01/26/2021   PLT 417 (H) 01/26/2021    Lab Results  Component Value Date   CREATININE 1.06 01/27/2021   BUN 15 01/27/2021   NA 140 01/27/2021   K 3.5 01/27/2021   CL 107 01/27/2021   CO2 25 01/27/2021    Lab Results  Component Value Date   ALT 34 01/23/2021   AST 37 01/23/2021   ALKPHOS 107 01/23/2021   BILITOT 0.7 01/23/2021       Laurice Record, [facility] for Infectious [facility] 01/27/2021, 3:43 PM

## 2021-01-27 NOTE — TOC Progression Note (Signed)
Transition of Care [facility]) - Progression Note    Patient Details  Name: SAHIR TOLSON MRN: [id number] Date of Birth: [birth year]  Transition of Care [facility]) CM/SW [place], [place] Phone Number: 01/27/2021, 10:37 AM  Clinical Narrative:    HF CSW spoke with Butch Penny at [facility] [phone] to see about Mr. Guidotti coming back to [facility] and Butch Penny reported yes and she will need an FL2, negative COVID test and to verify he is off of the IV and just on the IV abx. CSW notified MD about the negative COVID test needed and for a rapid tier 2 test. Butch Penny reported to send him without the COVID results back and that they will check in the system once they get back so he can get to them at a decent time. CSW completed FL2 to send to [facility].  CSW will continue to follow throughout discharge.   Expected Discharge Plan: Skilled Nursing Facility Barriers to Discharge: No Barriers Identified  Expected Discharge Plan and Services Expected Discharge Plan: [place] In-house Referral: Clinical Social Work Discharge Planning Services: CM Consult                                           Social Determinants of Health (SDOH) Interventions    Readmission Risk Interventions No flowsheet data found.

## 2021-01-27 NOTE — Progress Notes (Addendum)
Advanced Heart Failure Rounding Note  PCP-Cardiologist: Loralie Champagne, MD   Subjective:    Got additional IV Lasix yesterday. Doubt today's wt is accurate (charted 133 lb, 162 lb yesterday). CVP 7   UOP not recorded, several unmeasured occurences. Scr stable, 1.06.   In NSR. EKG not done yet today K 3.5 Mg 2.4   EP following and assisting w/ Tikosyn dosing.  Neurology also following for ?seizures. No seizures or epileptiform activity on EEG 11/20   Objective:   Weight Range: 60.4 kg Body mass index is 20.86 kg/m.   Vital Signs:   Pulse Rate:  [94-95] 95 (11/23 0100) Resp:  [20-29] 29 (11/23 0100) BP: (107-130)/(78-89) 107/89 (11/23 0100) SpO2:  [91 %-100 %] 100 % (11/23 0100) Weight:  [60.4 kg] 60.4 kg (11/23 0500) Last BM Date: 01/26/21  Weight change: Filed Weights   01/25/21 0321 01/26/21 0332 01/27/21 0500  Weight: 76.3 kg 73.7 kg 60.4 kg    Intake/Output:   Intake/Output Summary (Last 24 hours) at 01/27/2021 0828 Last data filed at 01/27/2021 0300 Gross per 24 hour  Intake 1128.02 ml  Output --  Net 1128.02 ml      Physical Exam   General:  elderly WM, No respiratory difficulty, confused  HEENT: normal Neck: supple. JVD 7 cm Carotids 2+ bilat; no bruits. No lymphadenopathy or thyromegaly appreciated. Cor: PMI nondisplaced. Regular rate & rhythm. No rubs, gallops or murmurs. Lungs: clear Abdomen: soft, nontender, nondistended. No hepatosplenomegaly. No bruits or masses. Good bowel sounds. Extremities: no cyanosis, clubbing, rash, edema Neuro: alert but confused. Affect pleasant.    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  No results for input(s): LIPASE, AMYLASE in the last 72 hours. Cardiac Enzymes No results for input(s): CKTOTAL, CKMB, CKMBINDEX, TROPONINI in the last 72 hours.  BNP: BNP (last 3 results) Recent Labs    01/08/21 0000 01/20/21 1631 01/23/21 1355  BNP 319.00 286.6* 223.5*    ProBNP (last 3 results) No results for input(s): PROBNP in the last 8760 hours.   D-Dimer No results for input(s): DDIMER in the last 72 hours. Hemoglobin A1C No results for input(s): HGBA1C in the last 72 hours. Fasting Lipid Panel No results for input(s): CHOL, HDL, LDLCALC, TRIG, CHOLHDL, LDLDIRECT in the last 72 hours. Thyroid Function Tests No results for input(s): TSH, T4TOTAL, T3FREE, THYROIDAB in the last 72 hours.  Invalid input(s): FREET3   Other results:   Imaging    No results found.   Medications:     Scheduled Medications:  amantadine  100 mg Oral BID   amLODipine  10 mg Oral Daily   apixaban  5 mg Oral BID   atorvastatin  80 mg Oral Daily   Chlorhexidine Gluconate Cloth  6 each  Topical Daily   docusate  100 mg Oral BID   dofetilide  375 mcg Oral BID   finasteride  5 mg Oral Daily   lacosamide  50 mg Oral BID   loratadine  10 mg Oral QHS   magnesium gluconate  500 mg Oral Daily   multivitamin with minerals  1 tablet Oral Daily   polyvinyl alcohol  2 drop Both Eyes Daily   sennosides  15 mL Oral BID   sodium chloride flush  10-40 mL Intracatheter Q12H   tamsulosin  0.4 mg Oral Daily   torsemide  20 mg Oral Daily    Infusions:  ampicillin (OMNIPEN) IV 2 g (01/27/21 0542)   cefTRIAXone (ROCEPHIN)  IV 200 mL/hr at 01/27/21 0300    PRN Medications: acetaminophen **OR** acetaminophen, polyethylene glycol, sodium chloride flush    Assessment/Plan   1. Acute on  Chronic Diastolic Heart Failure: Most recent echo 10/22 EF 65-70%. Admitted w/ volume overload and dyspnea. Responding well to IV Lasix. CVP 6-7. - Start torsemide at 20 mg daily  -Supp K 2. 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 Qtc read as 534 ms on ECG this am, appears shorter than this (? 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 Dr. [name] discussed with EP - continue Tikosyn - continue Eliquis 5 mg bid - Keep K > 4.0 and Mg > 2.0  3. 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 Neuro consulted this admit for ? Seizures. EEG was negative  4. H/o Enterococcal bacteremia: Source uncertain.  No PNA noted on CXR.  Enterococcus did not grow in urine.  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  He has a new left cerebellar CVA (small) that may be embolic from MV vegetation.  Blood cultures from 10/10, 10/11, 10/13 were positive.  Cultures from 10/15 with S epidermidis, likely contaminant.  Resent 10/17, these cultures remained negative.  - Continues on ceftriaxone and ampicillin.  - Not candidate for cardiac surgery, management will have to be medical.  5. 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  Echo 10/22 with EF 65-70%, normal RV, bioprosthetic AVR mean gradient 16 mmHg appears normal, bioprosthetic MV mean gradient 10, increased from prior but HR also in 100s when study done.  TEE as above showed bioprosthetic MV vegetation with trivial MR and mean gradient 8.  6. Anemia: chronic. Baseline Hgb 7-8 range. 7.9 today  - management per primary team   [place] -Palliative care following. DNR/DNI   [place] -Stable for discharge from HF  perspective back to SNF. Will arrange post hospital f/u in the [facility].   Length of Stay: [address] PA-C  01/27/2021, 8:28 AM  Advanced Heart Failure Team Pager [phone] (M-F; 7a - 5p)  Please contact [facility] for night-coverage after hours (5p -7a ) and weekends on [email or web address]  Patient seen with PA, agree with the above note.   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  CVP 7 when measured today.   General: NAD Neck: JVP 7-8 cm, no thyromegaly or thyroid nodule.  Lungs: Clear to auscultation bilaterally with normal respiratory effort. CV: Nondisplaced PMI.  Heart regular S1/S2,  no S3/S4, 2/6 SEM RUSB.  No peripheral edema.   Abdomen: Soft, nontender, no hepatosplenomegaly, no distention.  Skin: Intact without lesions or rashes.  Neurologic: Awake/alert. Does not communicate.  Extremities: No clubbing or cyanosis.  HEENT: Normal.   Patient has diuresed well, acute on chronic diastolic CHF.  CVP down to 7.  - Start torsemide 20 mg daily with KCl 20 daily.    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  He is not on nodal blockers.  QTc has been acceptable on ECG, he remains on Tikosyn.  Would get ECG today prior to discharge.     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 Will arrange for repeat echo as outpatient.    Stable low hgb, no overt bleeding.  Per primary.    I think he should be ready for discharge today.  Will arrange followup in my office.  Cardiac meds for home: torsemide 20 daily, KCl 20 daily, dofetilide 375 mcg bid, amlodipine 10 daily, atorvastatin 80 daily, apixaban 5 bid. Ceftriaxone/ampicillin to continue to tomorrow, then will need long-term prophylaxis with amoxicillin.   Loralie Champagne 01/27/2021 9:27 AM

## 2021-01-27 NOTE — Progress Notes (Signed)
Tikosyn unavailable, contact pharmacy several times advising the need for the medication.

## 2021-01-27 NOTE — Care Management Important Message (Signed)
Important Message  Patient Details  Name: [name] [name] MRN: [id number] Date of Birth: Mar 26, 1940   Medicare Important Message Given:  Yes     [name] 01/27/2021, 11:26 AM

## 2021-01-27 NOTE — Discharge Summary (Addendum)
Physician Discharge Summary  [name] [name] ESP:[id number] DOB: 17-Dec-1940  PCP: [name], MD  Admitted from: SNF Discharged to: SNF  Admit date: 01/23/2021 Discharge date: 01/27/2021  Recommendations for Outpatient Follow-up:    Contact information for follow-up providers     [place] Follow up.   Specialty: Cardiology Why: 02/01/21 @ 9:00AM with R. Fenton, PA-C, follow up on Tikosyn/EKG.  Also follow labs (CBC, BMP & magnesium) Contact information: [address] [id number] mc [address] [place]        Larey Dresser, MD Follow up.   Specialty: Cardiology Why: 12/2 at 8:40 AM at the [facility] information: [address] [place] [address] [phone]         [name], MD. Schedule an appointment as soon as possible for a visit.   Specialty: Internal Medicine Contact information: [place] [address] [phone]         Larey Dresser, MD .   Specialty: Cardiology Contact information: [phone] [address] [place] [address] [phone]              Contact information for after-discharge care     Destination     [facility] .   Service: Skilled Nursing Contact information: [place]: None    Equipment/Devices: TBD at [facility]  Patient had a PICC line prior to admission for administration of prolonged IV antibiotics which are supposed to end on 01/28/2021.  Post completion, recommend contacting infectious disease MD/Dr. Scharlene Gloss regarding need for keeping the PICC line or if it can be removed.    Discharge Condition: Improved and stable.   Code Status: DNR Diet recommendation: Heart healthy diet (dysphagia 3 and thin liquids)    Discharge Diagnoses:   Principal Problem:   CHF exacerbation ([place]) Active Problems:   Hyperlipidemia with target LDL less than 130   Persistent atrial fibrillation (HCC)   Essential hypertension   Mitral valve disorder   Long term (current) use of anticoagulants   Acute on chronic diastolic CHF (congestive heart failure) ([place])   Brief Summary: [name] [name] is a 80 y.o. 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 He required 2L O2 and work up was consistent with pulmonary edema (bilateral R > L opacities on CXR, BNP 223, clinically overloaded). IV lasix was given with good diuresis. Due to prolonged QT interval, tikosyn was held that night but restarted the next morning on the advice of EP. IV antibiotics have been continued. Heart failure team was consulted and patient underwent diuresis.  Assessment & Plan:  Acute hypoxic respiratory failure:  -Resolved and currently saturating normally on room air.   Acute on chronic HFpEF: Appeared volume up, CVP is 10. Weight here 80.2kg. DC weight was  85kg > then documented at [facility] between 81 - 85kg, and 83.8kg at cardiology office 11/16. LVEF was 65-70% on recent echo.  -Treated with lasix $RemoveBe'40mg'bOSfTiSRO$  IV BID today, CVP down, volume status clinically improved.  Heart failure MD follow-up appreciated.  They have cleared patient for discharge back to SNF and have arranged outpatient follow-up.  Patient is to resume prior home dose of torsemide 20 Mg daily.  Cardiology plans to  arrange for repeat echo as outpatient.   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 - Continuing tikosyn. This is being managed by EP. - Continue eliquis. - As noted above, cardiology has reviewed and cleared for discharge home.  QTC on EKG today 497 ms.  Recommend following EKG, BMP and magnesium closely as outpatient.   Hypokalemia: Associated with QT prolongation.  - Replace again today prior to discharge, since <4.  Follow BMP, magnesium as outpatient.   Enterococcus bacteremia with prosthetic MV endocarditis: Had +blood cultures 10/10, 10/11, 10/13, and negative on 10/17. Leukocytosis has improved, no fever or symptoms of uncontrolled infection at this time. - Continue ceftriaxone and ampicillin through 01/28/21 with plans to convert to indefinite amoxicillin thereafter. Followed by Dr. Linus Salmons. - Infectious disease formally consulted on day of discharge and recommended amoxicillin 500 Mg twice daily after completion of IV antibiotics tomorrow.   Acute metabolic encephalopathy: Improving with treatment as above though continues to have delirium.  Unclear if patient has underlying cognitive impairment. - TSH, ammonia normal, B12 elevated.  - EEG and MRI brain ordered per neurology recommendations, without acute findings.   Cerebrovascular disease, history of strokes: Right MCA June 2022 while holding eliquis due to SDH, and Left cerebellar vermis Oct 2022 ?related to bacteremia. CT head with chronic R MCA and L parietal territory infarcts and no acute changes. MRI without new findings this admission.  MRI did show interval expected evolution of the small intraparenchymal hematoma in the right frontal lobe since 12/18/2020 - Continue eliquis (even with high risk hx SDH).   - Continue PT/OT due to persistent weakness - Continue dysphagia 3 diet due to persistent dysphagia. SLP evaluation requested. Dietitian also consulted.   Constipation: Acute  on chronic, LBM 11/ 23.  Abd benign.  -Adjusted bowel regimen.  Changed MiraLAX to 17 g daily, change Colace to Senokot S1 tab p.o. twice daily and can use Dulcolax suppositories as needed.  Pt at high risk for ileus.   Seizure disorder: Was changed from keppra to vimpat at recent admission due to concern for encephalopathy.  - Continue vimpat - EEG without seizure activity noted. Slowing around area of old stroke noted.    Tremors: No current evidence of ictal periods and tremor currently is better than it had been  - Per report, Dr. [name] reported decreasing amantadine dose by half and monitoring for 2 weeks. Has follow up scheduled 11/30 at 3pm.   HTN:  - Continue norvasc   BPH:  - Continue tamsulosin, finasteride. UA without evidence of infection.   Anemia of chronic disease: Hgb low but at baseline. Anemia panel consistent with chronic disease with sufficient ferritin and low TIBC/% sat.  - Monitor hgb, near his low baseline   HLD: LFTs wnl. - Continue statin   Ascending aortic aneurysm:  - Followed as outpatient.   Consultants:  Cardiology Palliative care Neurology   Procedures:  EEG     Discharge Instructions  Discharge Instructions     (HEART FAILURE PATIENTS) Call MD:  Anytime you have any of the following symptoms: 1) 3 pound weight gain in 24 hours or 5 pounds in 1 week 2) shortness of breath, with or without a dry hacking cough 3) swelling in the hands, feet or stomach 4) if you have to sleep on extra pillows at night in order to breathe.   Complete by: As directed    Call MD for:  difficulty breathing, headache or visual disturbances   Complete by: As directed    Call MD for:  extreme fatigue   Complete by: As directed    Call MD for:  persistant dizziness or light-headedness   Complete by: As directed    Call MD for:  persistant nausea and vomiting   Complete by: As directed    Call MD for:  severe uncontrolled pain   Complete by: As directed    Call  MD for:  temperature >100.4   Complete by: As directed    Diet - low sodium heart healthy   Complete by: As directed    Diet consistency: Dysphagia 3 and thin liquids.   Increase activity slowly   Complete by: As directed    No wound care   Complete by: As directed    No wound care   Complete by: As directed         Medication List     STOP taking these medications    docusate sodium 100 MG capsule Commonly known as: Colace       TAKE these medications    acetaminophen 325 MG tablet Commonly known as: TYLENOL Take 650 mg by mouth every 4 (four) hours as needed for fever or mild pain.   amantadine 50 MG/5ML solution Commonly known as: SYMMETREL Take 10 mLs (100 mg total) by mouth 2 (two) times daily. 8 AM and 2 PM   amLODipine 10 MG tablet Commonly known as: NORVASC Take 1 tablet (10 mg total) by mouth daily.   amoxicillin 400 MG/5ML suspension Commonly known as: AMOXIL Take 6.3 mLs (500 mg total) by mouth 2 (two) times daily. Please flavor.  Duration to be determined by infectious disease MD during outpatient follow-up. Start taking on: January 29, 2021   ampicillin  IVPB Inject 2 g into the vein See admin instructions for 1 day. 67ml/hour over 4 hours. 0800 and 2000 doses should be run at 24ml/hour over 3 hours (in order to give Rocephin at 1100/2300. Use SASH method of flushing after rocephin before resuming Ampicillin  Indication: Enterococcus endocarditis First Dose: Yes Last Day of Therapy: 01/28/21 Labs - Once weekly: CBC/D and BMP, Labs - Every other week: ESR and CRP What changed:  how much to take when to take this additional instructions   apixaban 5 MG Tabs tablet Commonly known as: ELIQUIS Take 1 tablet (5 mg total) by mouth 2 (two) times daily.   atorvastatin 80 MG tablet Commonly known as: LIPITOR Take 1 tablet (80 mg total) by mouth daily.   BIOTENE MOISTURIZING MOUTH MT Use as directed 2 sprays in the mouth or throat in the morning, at  noon, and at bedtime.   bisacodyl 10 MG suppository Commonly known as: DULCOLAX Place 1 suppository (10 mg total) rectally daily as needed for moderate constipation or mild constipation. What changed: reasons to take this   cefTRIAXone  IVPB Commonly known as: ROCEPHIN Inject 2 g into the vein every 12 (twelve) hours. Indication:  Enterococcal endocarditis  First Dose: Yes Last Day of Therapy:  01/28/21 Labs -  Once weekly:  CBC/D and BMP, Labs - Every other week:  ESR and CRP Method of administration: IV Push Method of administration may be changed at the discretion of home infusion pharmacist based upon assessment of the patient and/or caregiver's ability to self-administer the medication ordered. What changed: additional instructions   clobetasol cream 0.05 % Commonly known as: TEMOVATE Apply 1 application topically daily as needed (for skin irritation).   dofetilide 125 MCG capsule Commonly known as: TIKOSYN Take 3 capsules (375 mcg total) by mouth 2 (two) times daily.   famotidine 20 MG tablet Commonly known as: PEPCID Take 20 mg by mouth at bedtime as needed for indigestion.   ferrous sulfate 220 (44 Fe) MG/5ML solution Take 240 mg by mouth every Monday, Wednesday, and Friday.   finasteride 5 MG tablet Commonly known as: PROSCAR Take 1 tablet (5 mg total) by mouth daily.   hydrocortisone cream 1 % Apply topically 2 (two) times daily as needed for itching (rash).   lacosamide 50 MG Tabs tablet Commonly known as: VIMPAT Take 1 tablet (50 mg total) by mouth 2 (two) times daily.   levalbuterol 0.63 MG/3ML nebulizer solution Commonly known as: XOPENEX Take 0.63 mg by nebulization every 6 (six) hours as needed for wheezing or shortness of breath.   loratadine 10 MG tablet Commonly known as: CLARITIN Take 10 mg by mouth at bedtime.   LORazepam 0.5 MG tablet Commonly known as: ATIVAN Take 0.5 tablets (0.25 mg total) by mouth every 6 (six) hours as needed for up to  14 days for anxiety (x 72 hours).   magnesium gluconate 500 MG tablet Commonly known as: MAGONATE Take 1 tablet (500 mg total) by mouth daily.   multivitamin with minerals Tabs tablet Take 1 tablet by mouth daily.   polyethylene glycol 17 g packet Commonly known as: MIRALAX / GLYCOLAX Take 17 g by mouth daily. What changed:  when to take this reasons to take this   potassium chloride 10 MEQ tablet Commonly known as: KLOR-CON Take 2 tablets (20 mEq total) by mouth daily. What changed: how much to take   senna-docusate 8.6-50 MG tablet Commonly known as: Senokot S Take 1 tablet by mouth 2 (two) times daily.   Systane 0.4-0.3 % Soln Generic drug: Polyethyl Glycol-Propyl Glycol Place 2 drops into both eyes daily.   tamsulosin 0.4 MG Caps capsule Commonly known as: FLOMAX Take 0.4 mg by mouth daily.   torsemide 20 MG tablet Commonly known as: Demadex Take 1 tablet (20 mg total) by mouth daily.       Allergies  Allergen Reactions   Naproxen Hives   No [employer] Food Allergies Other (See Comments)    Scallops - distress, nausea and vomitting      Procedures/Studies: CT Head Wo Contrast  Result Date: 01/23/2021 CLINICAL DATA:  Stroke follow-up. EXAM: CT HEAD WITHOUT CONTRAST TECHNIQUE: Contiguous axial images were obtained from the base of the skull through the vertex without intravenous contrast. COMPARISON:  December 14, 2020 FINDINGS: Brain: A right MCA stroke with encephalomalacia is stable. Ventricular dilatation remains. No acute subdural, epidural, or subarachnoid hemorrhage identified. Cerebellum, brainstem, and basal cisterns are normal. White matter changes noted. No acute ischemia identified. A small posterior left parietal infarct, unchanged. No mass effect or midline shift. Vascular: Calcified atherosclerosis in the intracranial carotids. Skull: Previous right craniotomy. Sinuses/Orbits: No acute finding. Other: No other abnormalities. IMPRESSION: Chronic  right MCA stroke. Chronic left posterior parietal stroke. No acute intracranial abnormality. Electronically Signed  By: Dorise Bullion III M.D.   On: 01/23/2021 15:46   MR BRAIN WO CONTRAST  Result Date: 01/24/2021 CLINICAL DATA:  Delirium EXAM: MRI HEAD WITHOUT CONTRAST TECHNIQUE: Multiplanar, multiecho pulse sequences of the brain and surrounding structures were obtained without intravenous contrast. COMPARISON:  CT head dated 1 day prior, brain MRI 12/18/2020 FINDINGS: Brain: There is significant artifact obscuring large portions of the axial DWI sequence including the entire posterior fossa. There is no evidence of acute ischemia on the coronal DWI sequence. There is no evidence of acute intracranial hemorrhage or acute extra-axial fluid collection. There is marked global parenchymal volume loss with enlargement of the ventricular system. There is an unchanged large right MCA territorial infarct with associated encephalomalacia and ex vacuo dilatation of the right lateral ventricle. An additional remote infarct in the left parietal lobe is unchanged. There is extensive FLAIR signal abnormality throughout the subcortical and periventricular white matter likely reflecting sequela of advanced chronic white matter microangiopathy. There is a thin subdural collection overlying the right cerebral hemisphere underlying a craniotomy site which may reflect dural thickening or a tiny chronic subdural hematoma. There is SWI signal dropout in the right frontal lobe consistent with interval evolution of the previously seen intraparenchymal hematoma on the MRI of 12/18/2020. There are a few additional scattered punctate chronic microhemorrhages, nonspecific. There is no solid mass lesion.  There is no midline shift. Vascular: Normal flow voids. Skull and upper cervical spine: Postsurgical changes reflecting right frontal parietal craniotomy are again seen. There is no suspicious marrow signal abnormality.  Sinuses/Orbits: The paranasal sinuses are clear. Bilateral lens implants are in place. The globes and orbits are otherwise unremarkable. Other: None. IMPRESSION: 1. No evidence of acute intracranial pathology. 2. Interval expected evolution of the small intraparenchymal hematoma in the right frontal lobe since 12/18/2020. 3. Unchanged remote MCA territorial infarct and small remote infarct in the left parietal lobe. 4. 3 mm thick extra-axial signal abnormality overlying the right cerebral hemisphere deep to the craniotomy site may reflect a small chronic subdural hematoma or dural thickening. The appearance is not significantly changed since 12/18/2020. 5. Unchanged advanced global parenchymal volume loss and chronic white matter microangiopathy. Electronically Signed   By: Valetta Mole M.D.   On: 01/24/2021 16:53   DG Chest Port 1 View  Result Date: 01/23/2021 CLINICAL DATA:  Weight gain.  Shortness of breath. EXAM: PORTABLE CHEST 1 VIEW COMPARISON:  December 31, 2020 FINDINGS: Stable cardiomegaly. Stable hila and mediastinum. A right PICC line terminating central SVC. No pneumothorax. Bilateral pulmonary opacities, right greater than left. No other acute abnormalities. IMPRESSION: Right greater than left pulmonary opacities may represent asymmetric edema, especially given the cardiomegaly and history of weight gain. A multifocal infectious process is considered less likely. Recommend clinical correlation and follow-up to complete resolution. Electronically Signed   By: Dorise Bullion III M.D.   On: 01/23/2021 15:40            EEG adult  Result Date: 01/24/2021 Lora Havens, MD     01/24/2021  6:24 PM Patient Name: LARREN COPES MRN: [id number] Epilepsy Attending: Lora Havens Referring Physician/Provider: Dr Derrick Ravel Date: 01/24/2021 Duration: 23.17 mins Patient history: 80yo m with h/o right MCA stroke, epilepsy who presented with ams. EEG to evaluate for seizure Level of alertness:  Awake AEDs during EEG study: LCM Technical aspects: This EEG study was done with scalp electrodes positioned according to the 10-20 International system of electrode placement. Electrical activity was  acquired at a sampling rate of $Remov'500Hz'xucgPa$  and reviewed with a high frequency filter of $RemoveB'70Hz'lxwawWAS$  and a low frequency filter of $RemoveB'1Hz'kldgwGBu$ . EEG data were recorded continuously and digitally stored. Description: The posterior dominant rhythm consists of 8-9 Hz activity of moderate voltage (25-35 uV) seen predominantly in posterior head regions, symmetric and reactive to eye opening and eye closing. EEG showed continuous low amplitude 3 to 6 Hz theta-delta slowing in right frontotemporal region. Patient was noted to have right upper extremity tremor like movement  intermittently throughout the study. Concomitant eeg before, during and after the events didn't show any eeg change to suggest seizure Hyperventilation and photic stimulation were not performed.   ABNORMALITY - Continuous slow, right frontotemporal region. IMPRESSION: This study is suggestive of cortical dysfunction arising from  right frontotemporal region, nonspecific etiology but likely secondary to underlying stroke. No seizures or epileptiform discharges were seen throughout the recording. Patient was noted to have right upper extremity tremor like movement  intermittently throughout the study without concomitant eeg change. These episodes were most likely NOT epileptic. Priyanka O Yadav   [place] EKG SITE RITE  Result Date: 01/01/2021 If [facility] image not attached, placement could not be confirmed due to current cardiac rhythm.     Subjective: Alert and oriented only to self.  Unable to get much history from him.  Spouse at bedside.  Reports that patient had a BM yesterday and one while I was in the room and still to be cleaned up.  No other complaints reported.  Discharge Exam:  Vitals:   01/27/21 0500 01/27/21 0950 01/27/21 1339 01/27/21 1433  BP:  (!)  130/105 (!) 128/102   Pulse:   90   Resp:   20   Temp:    (!) 97.2 F (36.2 C)  TempSrc:    Oral  SpO2:   100%   Weight: 60.4 kg     Height:        General: Elderly male, moderately built and nourished lying comfortably supine in bed without distress. Cardiovascular: S1 & S2 heard, RRR, S1/S2 +. No murmurs, rubs, gallops or clicks. No JVD or pedal edema.  Telemetry personally reviewed: A. fib with controlled ventricular rate, BBB morphology. Respiratory: Clear to auscultation without wheezing, rhonchi or crackles. No increased work of breathing. Abdominal:  Non distended, non tender & soft. No organomegaly or masses appreciated. Normal bowel sounds heard. CNS: Alert and oriented only to self. No focal deficits.  Follows some simple instructions Extremities: no edema, no cyanosis    The results of significant diagnostics from this hospitalization (including imaging, microbiology, ancillary and laboratory) are listed below for reference.     Microbiology: Recent Results (from the past 240 hour(s))  Resp Panel by RT-PCR (Flu A&B, Covid) Nasopharyngeal Swab     Status: None   Collection Time: 01/23/21  2:32 PM   Specimen: Nasopharyngeal Swab; Nasopharyngeal(NP) swabs in vial transport medium  Result Value Ref Range Status   SARS Coronavirus 2 by RT PCR NEGATIVE NEGATIVE Final    Comment: (NOTE) SARS-CoV-2 target nucleic acids are NOT DETECTED.  The SARS-CoV-2 RNA is generally detectable in upper respiratory specimens during the acute phase of infection. The lowest concentration of SARS-CoV-2 viral copies this assay can detect is 138 copies/mL. A negative result does not preclude SARS-Cov-2 infection and should not be used as the sole basis for treatment or other patient management decisions. A negative result may occur with  improper specimen collection/handling, submission of specimen other than  nasopharyngeal swab, presence of viral mutation(s) within the areas targeted by this  assay, and inadequate number of viral copies(<138 copies/mL). A negative result must be combined with clinical observations, patient history, and epidemiological information. The expected result is Negative.  Fact Sheet for Patients:  EntrepreneurPulse.com.au  Fact Sheet for Healthcare Providers:  IncredibleEmployment.be  This test is no t yet approved or cleared by the [place] FDA and  has been authorized for detection and/or diagnosis of SARS-CoV-2 by FDA under an Emergency Use Authorization (EUA). This EUA will remain  in effect (meaning this test can be used) for the duration of the COVID-19 declaration under Section 564(b)(1) of the Act, 21 U.S.C.section 360bbb-3(b)(1), unless the authorization is terminated  or revoked sooner.       Influenza A by PCR NEGATIVE NEGATIVE Final   Influenza B by PCR NEGATIVE NEGATIVE Final    Comment: (NOTE) The Xpert Xpress SARS-CoV-2/FLU/RSV plus assay is intended as an aid in the diagnosis of influenza from Nasopharyngeal swab specimens and should not be used as a sole basis for treatment. Nasal washings and aspirates are unacceptable for Xpert Xpress SARS-CoV-2/FLU/RSV testing.  Fact Sheet for Patients: EntrepreneurPulse.com.au  Fact Sheet for Healthcare Providers: IncredibleEmployment.be  This test is not yet approved or cleared by the [place] FDA and has been authorized for detection and/or diagnosis of SARS-CoV-2 by FDA under an Emergency Use Authorization (EUA). This EUA will remain in effect (meaning this test can be used) for the duration of the COVID-19 declaration under Section 564(b)(1) of the Act, 21 U.S.C. section 360bbb-3(b)(1), unless the authorization is terminated or revoked.  Performed at [facility], [place] [address] [place], [place] [address]   MRSA Next Gen by PCR, Nasal     Status: None   Collection Time: 01/24/21  4:46 AM    Specimen: Nasal Mucosa; Nasal Swab  Result Value Ref Range Status   MRSA by PCR Next Gen NOT DETECTED NOT DETECTED Final    Comment: (NOTE) The GeneXpert MRSA Assay (FDA approved for NASAL specimens only), is one component of a comprehensive MRSA colonization surveillance program. It is not intended to diagnose MRSA infection nor to guide or monitor treatment for MRSA infections. Test performance is not FDA approved in patients less than 31 years old. Performed at [facility], [place] [address], [place], [place] [address]   Resp Panel by RT-PCR (Flu A&B, Covid) Nasopharyngeal Swab     Status: None   Collection Time: 01/27/21 10:09 AM   Specimen: Nasopharyngeal Swab; Nasopharyngeal(NP) swabs in vial transport medium  Result Value Ref Range Status   SARS Coronavirus 2 by RT PCR NEGATIVE NEGATIVE Final    Comment: (NOTE) SARS-CoV-2 target nucleic acids are NOT DETECTED.  The SARS-CoV-2 RNA is generally detectable in upper respiratory specimens during the acute phase of infection. The lowest concentration of SARS-CoV-2 viral copies this assay can detect is 138 copies/mL. A negative result does not preclude SARS-Cov-2 infection and should not be used as the sole basis for treatment or other patient management decisions. A negative result may occur with  improper specimen collection/handling, submission of specimen other than nasopharyngeal swab, presence of viral mutation(s) within the areas targeted by this assay, and inadequate number of viral copies(<138 copies/mL). A negative result must be combined with clinical observations, patient history, and epidemiological information. The expected result is Negative.  Fact Sheet for Patients:  EntrepreneurPulse.com.au  Fact Sheet for Healthcare Providers:  IncredibleEmployment.be  This test is no  t yet approved or cleared by the [place] and  has been authorized for detection and/or  diagnosis of SARS-CoV-2 by FDA under an Emergency Use Authorization (EUA). This EUA will remain  in effect (meaning this test can be used) for the duration of the COVID-19 declaration under Section 564(b)(1) of the Act, 21 U.S.C.section 360bbb-3(b)(1), unless the authorization is terminated  or revoked sooner.       Influenza A by PCR NEGATIVE NEGATIVE Final   Influenza B by PCR NEGATIVE NEGATIVE Final    Comment: (NOTE) The Xpert Xpress SARS-CoV-2/FLU/RSV plus assay is intended as an aid in the diagnosis of influenza from Nasopharyngeal swab specimens and should not be used as a sole basis for treatment. Nasal washings and aspirates are unacceptable for Xpert Xpress SARS-CoV-2/FLU/RSV testing.  Fact Sheet for Patients: EntrepreneurPulse.com.au  Fact Sheet for Healthcare Providers: IncredibleEmployment.be  This test is not yet approved or cleared by the [place] FDA and has been authorized for detection and/or diagnosis of SARS-CoV-2 by FDA under an Emergency Use Authorization (EUA). This EUA will remain in effect (meaning this test can be used) for the duration of the COVID-19 declaration under Section 564(b)(1) of the Act, 21 U.S.C. section 360bbb-3(b)(1), unless the authorization is terminated or revoked.  Performed at [facility], [place] [address], [place], [place] [address]      Labs: CBC: Recent Labs  Lab 01/20/21 1631 01/23/21 1355 01/24/21 0154 01/25/21 0623 01/26/21 0435  WBC 15.1* 10.6* 10.0  --  10.4  HGB 8.2* 7.7* 7.3* 7.4* 7.9*  HCT 26.8* 26.5* 23.3* 24.4* 25.9*  MCV 90.5 91.7 88.3  --  88.7  PLT 448* 448* 395  --  417*    Basic Metabolic Panel: Recent Labs  Lab 01/23/21 1355 01/24/21 0154 01/24/21 0157 01/25/21 0623 01/25/21 0853 01/26/21 0435 01/27/21 0538  NA 141 142  --  143  --  143 140  K 3.0* 3.4*  --  3.6  --  3.8 3.5  CL 105 107  --  107  --  104 107  CO2 27 27  --  27  --  26 25   GLUCOSE 120* 126*  --  93  --  105* 137*  BUN 12 11  --  11  --  12 15  CREATININE 0.93 0.94  --  0.94  --  0.95 1.06  CALCIUM 8.4* 8.5*  --  8.2*  --  8.7* 8.7*  MG  --   --  1.9  --  2.4 2.2 2.4    Liver Function Tests: Recent Labs  Lab 01/20/21 1631 01/23/21 1355  AST 27 37  ALT 35 34  ALKPHOS 115 107  BILITOT 0.6 0.7  PROT 6.9 6.9  ALBUMIN 2.5* 2.4*     Urinalysis    Component Value Date/Time   COLORURINE YELLOW 01/23/2021 1357   APPEARANCEUR CLEAR 01/23/2021 1357   LABSPEC 1.009 01/23/2021 1357   PHURINE 7.0 01/23/2021 1357   GLUCOSEU NEGATIVE 01/23/2021 1357   [place] 01/23/2021 1357   [place] 01/23/2021 1357   BILIRUBINUR negative 11/22/2013 0848   KETONESUR NEGATIVE 01/23/2021 1357   PROTEINUR NEGATIVE 01/23/2021 1357   UROBILINOGEN 0.2 11/22/2013 0848   UROBILINOGEN 1.0 08/05/2013 2207   NITRITE NEGATIVE 01/23/2021 1357   LEUKOCYTESUR NEGATIVE 01/23/2021 1357    I discussed in detail with patient spouse at bedside, updated care and answered all questions.  Time coordinating discharge: 35 minutes  SIGNED:  Vernell Leep,  MD,  Rosalita Chessman, [facility], [facility], [facility] (Care Management Physician Certified). [facility] Advisor  To contact the attending provider between 7A-7P or the covering provider during after hours 7P-7A, please log into the web site [email or web address] and access using universal [place] password for that web site. If you do not have the password, please call the hospital operator.

## 2021-01-27 NOTE — NC FL2 (Signed)
[place] LEVEL OF CARE SCREENING TOOL     IDENTIFICATION  Patient Name: [name] [name] Birthdate: [birth year] Sex: male Admission Date (Current Location): 01/23/2021  [facility] and [place] Number:  Herbalist and Address:  The . [facility], [place] [address], [place], [place] [address]      Provider Number: [id number]  Attending Physician Name and Address:  Modena Jansky, MD  Relative Name and Phone Number:  Domnick Chervenak Spouse [phone]    Current Level of Care: Hospital Recommended Level of Care: [place] Prior Approval Number:    Date Approved/Denied:   PASRR Number: [id number]  Discharge Plan: SNF    Current Diagnoses: Patient Active Problem List   Diagnosis Date Noted   Acute on chronic diastolic CHF (congestive heart failure) ([place])    CHF exacerbation ([place]) 01/23/2021   Tremor of unknown origin 01/18/2021   Prosthetic valve endocarditis ([place])    Protein-calorie malnutrition, severe 12/18/2020   S/P MVR (mitral valve repair)    History of CVA in adulthood    Bacteremia 12/15/2020   History of aortic valve replacement    History of mitral valve replacement    Pressure injury of skin 12/14/2020   Hypernatremia 12/14/2020   Somnolence    Diabetes mellitus type II, controlled ([place]) 10/15/2020   Oropharyngeal dysphagia    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[place])    Acute ischemic right MCA stroke ([place]) 08/15/2020   CVA (cerebral vascular accident) ([place]) 08/07/2020   Middle cerebral artery embolism, 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   Subdural hemorrhage following injury 07/31/2020   S/P craniotomy 07/28/2020   Subdural hematoma 07/27/2020   Vertigo 08/30/2018   S/P right THA, 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   Long term (current) use of anticoagulants 01/05/2018   History of food allergy 12/11/2017   Allergic reaction 12/11/2017   Emesis 12/11/2017   Visit for monitoring Tikosyn therapy  06/06/2017   Internal hemorrhoid, bleeding 05/19/2016   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   Aortic valve disorder 07/09/2015   Long term current use of anticoagulant therapy 01/19/2015   Atrial fibrillation, chronic ([place]) 01/20/2014   Elevated PSA 12/23/2013   Prediabetes 12/19/2013   Benign prostatic hyperplasia with urinary obstruction 10/29/2013   left frontal cerebral infarct secondary to afib 08/05/2013   Atrial flutter ([place]) 05/17/2013   Essential hypertension 06/19/2012   Routine health maintenance 04/26/2011   Persistent atrial fibrillation ([place]) 07/10/2008   Hyperlipidemia with target LDL less than 130 07/07/2008   History of colonic polyps 06/16/2008   ERECTILE DYSFUNCTION 09/25/2007    Orientation RESPIRATION BLADDER Height & Weight     Self, Time, Situation, Place  Normal Incontinent, External catheter Weight: 133 lb 2.5 oz (60.4 kg) Height:  5\' 7"  (170.2 cm)  BEHAVIORAL SYMPTOMS/MOOD NEUROLOGICAL BOWEL NUTRITION STATUS      Incontinent Diet (Please see DC summary)  AMBULATORY STATUS COMMUNICATION OF NEEDS Skin   Extensive Assist Non-Verbally (minimally communicative) Skin abrasions (Pressure injury on buttocks and left elbow posterior stage 2)                       Personal Care Assistance Level of Assistance  Bathing, Feeding, Dressing Bathing Assistance: Maximum assistance Feeding assistance: Maximum assistance Dressing Assistance: Maximum assistance     Functional Limitations Info  Sight, Hearing, Speech Sight Info: Impaired Hearing Info: Impaired Speech  Info: Adequate    SPECIAL CARE FACTORS FREQUENCY  PT (By licensed PT), OT (By licensed OT), Speech therapy     PT Frequency: 5x/week OT Frequency: 5x/week     Speech Therapy Frequency: weekly      Contractures Contractures Info: Not present    Additional Factors Info  Code Status, Allergies Code Status Info: DNR Allergies Info:  Naproxen, No Healthtouch Food Allergies           Current Medications (01/27/2021):  This is the current hospital active medication list Current Facility-Administered Medications  Medication Dose Route Frequency Provider Last Rate Last Admin   acetaminophen (TYLENOL) tablet 650 mg  650 mg Oral Q6H PRN Shela Leff, MD       Or   acetaminophen (TYLENOL) suppository 650 mg  650 mg Rectal Q6H PRN Shela Leff, MD       amantadine (SYMMETREL) 50 MG/5ML solution 100 mg  100 mg Oral BID Shela Leff, MD   100 mg at 01/27/21 0942   amLODipine (NORVASC) tablet 10 mg  10 mg Oral Daily Shela Leff, MD   10 mg at 01/27/21 0950   ampicillin (OMNIPEN) 2 g in sodium chloride 0.9 % 100 mL IVPB  2 g Intravenous Q4H Ralph Dowdy, RPH 300 mL/hr at 01/27/21 1018 2 g at 01/27/21 1018   apixaban (ELIQUIS) tablet 5 mg  5 mg Oral BID Shela Leff, MD   5 mg at 01/27/21 0950   atorvastatin (LIPITOR) tablet 80 mg  80 mg Oral Daily Shela Leff, MD   80 mg at 01/27/21 0950   cefTRIAXone (ROCEPHIN) 2 g in sodium chloride 0.9 % 100 mL IVPB  2 g Intravenous Q12H Ralph Dowdy, RPH 200 mL/hr at 01/27/21 0300 Infusion Verify at 01/27/21 0300   Chlorhexidine Gluconate Cloth 2 % PADS 6 each  6 each Topical Daily Shela Leff, MD   6 each at 01/27/21 1002   docusate (COLACE) 50 MG/5ML liquid 100 mg  100 mg Oral BID Reome, Earle J, RPH   100 mg at 01/26/21 0901   dofetilide (TIKOSYN) capsule 375 mcg  375 mcg Oral BID Baldwin [place], PA-C   375 mcg at 01/26/21 2139   finasteride (PROSCAR) tablet 5 mg  5 mg Oral Daily Shela Leff, MD   5 mg at 01/27/21 0950   lacosamide (VIMPAT) tablet 50 mg  50 mg Oral BID Shela Leff, MD   50 mg at 01/27/21 0950   loratadine (CLARITIN) tablet 10 mg  10 mg Oral QHS Shela Leff, MD   10 mg at 01/26/21 2134   magnesium gluconate (MAGONATE) tablet 500 mg  500 mg Oral Daily Shela Leff, MD   500 mg at 01/27/21 0950    multivitamin with minerals tablet 1 tablet  1 tablet Oral Daily Shela Leff, MD   1 tablet at 01/27/21 0950   polyethylene glycol (MIRALAX / GLYCOLAX) packet 17 g  17 g Oral Daily PRN Shela Leff, MD   17 g at 01/26/21 1608   polyvinyl alcohol (LIQUIFILM TEARS) 1.4 % ophthalmic solution 2 drop  2 drop Both Eyes Daily Shela Leff, MD   2 drop at 01/26/21 0902   sennosides (SENOKOT) 8.8 MG/5ML syrup 15 mL  15 mL Oral BID Vance Gather B, MD   15 mL at 01/26/21 1608   sodium chloride flush (NS) 0.9 % injection 10-40 mL  10-40 mL Intracatheter Q12H Patrecia Pour, MD   10 mL at 01/27/21 0958   sodium chloride  flush (NS) 0.9 % injection 10-40 mL  10-40 mL Intracatheter PRN Patrecia Pour, MD       tamsulosin [facility]) capsule 0.4 mg  0.4 mg Oral Daily Shela Leff, MD   0.4 mg at 01/27/21 1003   torsemide (DEMADEX) tablet 20 mg  20 mg Oral Daily Joette Catching, [place]   20 mg at 01/27/21 4712     Discharge Medications: Please see discharge summary for a list of discharge medications.  Relevant Imaging Results:  Relevant Lab Results:   Additional Information SSN#: [id number] COVID-19 Vaccine 05/05/2019 , 04/08/2019 Moderna Covid-19 Booster Vaccine 06/26/2020 , 01/02/2020  Chaeli Judy, LCSWA

## 2021-01-27 NOTE — Discharge Instructions (Signed)

## 2021-01-27 NOTE — Progress Notes (Signed)
PT Cancellation Note  Patient Details Name: [name] [name] MRN: [id number] DOB: January 31, 1941   Cancelled Treatment:    Reason Eval/Treat Not Completed: Other (comment); patient with nursing in the room assisting with toileting/cleaning.  Will re-attempt later.   Reginia Naas 01/27/2021, 9:44 AM Magda Kiel, PT Acute Rehabilitation Services CNPSZ:[phone] [phone] 01/27/2021

## 2021-01-27 NOTE — Consult Note (Signed)
[facility] Inpatient Consult   01/27/2021  [name] [name] 1940-10-20 [id number]  [place] [facility] CM)   Patient screened due to unplanned 30 day hospital readmission and high risk score for readmission. Review of patient's medical record reveals current disposition is to return to SNF. Patient's post hospital needs are to be met at a skilled nursing facility level of care.   Netta Cedars, MSN, RN [facility] [employer] [phone]  Toll free office [phone]

## 2021-01-27 NOTE — TOC Transition Note (Addendum)
Transition of Care [facility]) - CM/SW Discharge Note   Patient Details  Name: [name] [name] MRN: [id number] Date of Birth: 17-Aug-1940  Transition of Care [facility]) CM/SW Contact:  Lee, Severy Phone Number: 01/27/2021, 12:25 PM   Clinical Narrative:    Patient will DC to: [facility] Anticipated DC date: 01/27/21 Transport by: Corey Harold   Per MD patient ready for DC back to [place], SNF. RN to call report prior to discharge [phone] Room# 159). RN, patient, patient's family, and facility notified of DC. Discharge Summary and FL2 sent to facility. DC packet on chart with HV TOC appointment card. COVID test collected and results are negative. Ambulance transport requested for patient.   CSW will sign off for now as social work intervention is no longer needed. Please consult [place] again if new needs arise.     Final next level of care: [place] Barriers to Discharge: No Barriers Identified   Patient Goals and CMS Choice Patient states their goals for this hospitalization and ongoing recovery are:: Return to [facility].gov Compare Post Acute Care list provided to:: Patient Choice offered to / list presented to : Patient  Discharge Placement PASRR number recieved: 01/27/21            Patient chooses bed at: [facility] Patient to be transferred to facility by: [facility] Name of family member notified: Spouse [name] [phone] Patient and family notified of of transfer: 01/27/21  Discharge Plan and Services In-house Referral: Clinical Social Work Discharge Planning Services: CM Consult                                 Social Determinants of Health (SDOH) Interventions     Readmission Risk Interventions No flowsheet data found.   [name], MSW, [place] Heart Failure Social Worker

## 2021-01-27 NOTE — Progress Notes (Signed)
Report given to nurse at [facility].

## 2021-01-27 NOTE — Telephone Encounter (Signed)
Santiago Glad from [facility] contacted office to clarify orders. States she did not have orders for PO amoxicillin in patients discharge orders. Per chart patient is to continue iv abx until 11/25 and start oral amoxicillin after. She verbalized understanding and would call with addiitonal questions. [facility] will be keeping PICC in place until Monday to ensure they're able to get lab work as the patient is a difficult stick. RN forwarding to provider to make aware.   Elaine [place] Lorita Officer, RN

## 2021-02-01 ENCOUNTER — Ambulatory Visit (HOSPITAL_COMMUNITY)
Admit: 2021-02-01 | Discharge: 2021-02-01 | Disposition: A | Discharge status: Home / Self Care | Payer: Medicare Other | Attending: Physician Assistant | Admitting: Physician Assistant

## 2021-02-01 ENCOUNTER — Other Ambulatory Visit: Payer: Self-pay

## 2021-02-01 ENCOUNTER — Non-Acute Institutional Stay (SKILLED_NURSING_FACILITY): Payer: Medicare Other | Admitting: Internal Medicine

## 2021-02-01 ENCOUNTER — Encounter: Payer: Self-pay | Admitting: Internal Medicine

## 2021-02-01 VITALS — BP 120/66 | HR 89 | Ht 67.0 in

## 2021-02-01 DIAGNOSIS — G9341 Metabolic encephalopathy: Secondary | ICD-10-CM | POA: Diagnosis not present

## 2021-02-01 DIAGNOSIS — I69354 Hemiplegia and hemiparesis following cerebral infarction affecting left non-dominant side: Secondary | ICD-10-CM

## 2021-02-01 DIAGNOSIS — I4819 Other persistent atrial fibrillation: Secondary | ICD-10-CM | POA: Insufficient documentation

## 2021-02-01 DIAGNOSIS — I38 Endocarditis, valve unspecified: Secondary | ICD-10-CM | POA: Diagnosis not present

## 2021-02-01 DIAGNOSIS — K5901 Slow transit constipation: Secondary | ICD-10-CM | POA: Diagnosis not present

## 2021-02-01 DIAGNOSIS — I482 Chronic atrial fibrillation, unspecified: Secondary | ICD-10-CM | POA: Diagnosis not present

## 2021-02-01 DIAGNOSIS — Z953 Presence of xenogenic heart valve: Secondary | ICD-10-CM | POA: Insufficient documentation

## 2021-02-01 DIAGNOSIS — I509 Heart failure, unspecified: Secondary | ICD-10-CM

## 2021-02-01 DIAGNOSIS — D6869 Other thrombophilia: Secondary | ICD-10-CM | POA: Diagnosis not present

## 2021-02-01 DIAGNOSIS — Z792 Long term (current) use of antibiotics: Secondary | ICD-10-CM | POA: Diagnosis not present

## 2021-02-01 DIAGNOSIS — Z79899 Other long term (current) drug therapy: Secondary | ICD-10-CM | POA: Diagnosis not present

## 2021-02-01 DIAGNOSIS — I39 Endocarditis and heart valve disorders in diseases classified elsewhere: Secondary | ICD-10-CM | POA: Diagnosis not present

## 2021-02-01 DIAGNOSIS — I5032 Chronic diastolic (congestive) heart failure: Secondary | ICD-10-CM | POA: Insufficient documentation

## 2021-02-01 DIAGNOSIS — I080 Rheumatic disorders of both mitral and aortic valves: Secondary | ICD-10-CM | POA: Diagnosis not present

## 2021-02-01 DIAGNOSIS — I6601 Occlusion and stenosis of right middle cerebral artery: Secondary | ICD-10-CM | POA: Diagnosis not present

## 2021-02-01 DIAGNOSIS — Z7901 Long term (current) use of anticoagulants: Secondary | ICD-10-CM | POA: Insufficient documentation

## 2021-02-01 DIAGNOSIS — T826XXD Infection and inflammatory reaction due to cardiac valve prosthesis, subsequent encounter: Secondary | ICD-10-CM

## 2021-02-01 DIAGNOSIS — E785 Hyperlipidemia, unspecified: Secondary | ICD-10-CM

## 2021-02-01 DIAGNOSIS — S065X3S Traumatic subdural hemorrhage with loss of consciousness of 1 hour to 5 hours 59 minutes, sequela: Secondary | ICD-10-CM | POA: Diagnosis not present

## 2021-02-01 DIAGNOSIS — Z8673 Personal history of transient ischemic attack (TIA), and cerebral infarction without residual deficits: Secondary | ICD-10-CM | POA: Insufficient documentation

## 2021-02-01 DIAGNOSIS — R1312 Dysphagia, oropharyngeal phase: Secondary | ICD-10-CM | POA: Diagnosis not present

## 2021-02-01 DIAGNOSIS — I10 Essential (primary) hypertension: Secondary | ICD-10-CM

## 2021-02-01 DIAGNOSIS — R278 Other lack of coordination: Secondary | ICD-10-CM | POA: Diagnosis not present

## 2021-02-01 DIAGNOSIS — E870 Hyperosmolality and hypernatremia: Secondary | ICD-10-CM

## 2021-02-01 DIAGNOSIS — I5022 Chronic systolic (congestive) heart failure: Secondary | ICD-10-CM | POA: Diagnosis not present

## 2021-02-01 DIAGNOSIS — R2689 Other abnormalities of gait and mobility: Secondary | ICD-10-CM | POA: Diagnosis not present

## 2021-02-01 DIAGNOSIS — I330 Acute and subacute infective endocarditis: Secondary | ICD-10-CM | POA: Diagnosis not present

## 2021-02-01 DIAGNOSIS — R41841 Cognitive communication deficit: Secondary | ICD-10-CM | POA: Diagnosis not present

## 2021-02-01 DIAGNOSIS — I69391 Dysphagia following cerebral infarction: Secondary | ICD-10-CM | POA: Diagnosis not present

## 2021-02-01 DIAGNOSIS — I2510 Atherosclerotic heart disease of native coronary artery without angina pectoris: Secondary | ICD-10-CM | POA: Diagnosis not present

## 2021-02-01 DIAGNOSIS — R569 Unspecified convulsions: Secondary | ICD-10-CM

## 2021-02-01 LAB — COMPREHENSIVE METABOLIC PANEL: Calcium: 9.1 (ref 8.7–10.7)

## 2021-02-01 LAB — CBC AND DIFFERENTIAL
HCT: 28 — AB (ref 41–53)
Hemoglobin: 8.5 — AB (ref 13.5–17.5)
Platelets: 401 — AB (ref 150–399)
WBC: 13.3

## 2021-02-01 LAB — CBC: RBC: 3.27 — AB (ref 3.87–5.11)

## 2021-02-01 LAB — BASIC METABOLIC PANEL
BUN: 14 (ref 4–21)
CO2: 22 (ref 13–22)
Chloride: 105 (ref 99–108)
Creatinine: 0.9 (ref 0.6–1.3)
Glucose: 106
Potassium: 3.4 (ref 3.4–5.3)
Sodium: 141 (ref 137–147)

## 2021-02-01 LAB — POCT ERYTHROCYTE SEDIMENTATION RATE, NON-AUTOMATED: Sed Rate: 70

## 2021-02-01 NOTE — Progress Notes (Signed)
Provider:  Veleta Miners MD  Location:   [facility] Room Number: 010 Place of Service:  SNF ([phone])  PCP: [name], MD Patient Care Team: [name], MD as PCP - General (Internal Medicine) Larey Dresser, MD as PCP - Cardiology (Cardiology) [name], MD as Consulting Physician (Ophthalmology) [name], MD (Internal Medicine)  Extended Emergency Contact Information Primary Emergency Contact: Hirschfeld,Agnes S Address: [place], [place] [address] [name] of [place] Phone: [phone] Relation: Spouse Secondary Emergency Contact: [name], [place] [address] [name] of [place] Phone: [phone] Relation: Son  Code Status: DNR Goals of Care: Advanced Directive information Advanced Directives 02/01/2021  Does Patient Have a Medical Advance Directive? Yes  Type of Paramedic of [place];Living will;Out of facility DNR (pink MOST or yellow form)  Does patient want to make changes to medical advance directive? No - Patient declined  Copy of [place] in Chart? Yes - validated most recent copy scanned in chart (See row information)  Would patient like information on creating a medical advance directive? -  Pre-existing out of facility DNR order (yellow form or pink MOST form) -      Chief Complaint  Patient presents with   Readmit To SNF    Readmission to SNF    HPI: Patient is a 80 y.o. 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 due to Septic Emboli Hypernatremia  Also in the hospital from 05/23-05/27 for Right Subdural Hematoma s/p Right Craniotomy 06/3 -6/11 Patient had Acute Ischemic Stroke  Underwent Endovascular Revascularization of ICA Followed by Intubation and Hypertonic Saline infusion  Inpatient Rehab from 06/11-07/14 for Acute Ischemic Right MCA Stroke    Was send to the hospital for SOB and Weight gain not responding to PO demadex Chest Xray positive for Pulmonary edema  Treated with IV diuresis Back in his room  Seen in his room today Patient just finished his therapy and was sleepy but per Nurses and therapist seems to be doing well Alert more responsive They even stood him today during Therapy Session No More Rigors or tremors.  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    Other and unspecified hyperlipidemia    Pre-diabetes    Previous back surgery 1978, jan 2007   Psychosexual dysfunction with inhibited sexual excitement    SOB (shortness of breath)    "with heavy exercise"   Stroke ([place]) 08/2013   "I WAS IN AFIB AND THREW A  CLOT, THE EFFECTS WERE TRANSITORY AND DIDNT LAST BUT FOR 30 MINUTES"    Thoracic aortic aneurysm    Past Surgical History:  Procedure Laterality Date   AORTIC AND MITRAL VALVE REPLACEMENT     09/2004   CARDIOVERSION     3 times from 2004-2006   CARDIOVERSION N/A 09/26/2013   Procedure: CARDIOVERSION;  Surgeon: Larey Dresser, MD;  Location: [facility];  Service: Cardiovascular;  Laterality: N/A;   CARDIOVERSION N/A 06/19/2014   Procedure: CARDIOVERSION;  Surgeon: Jerline Pain, MD;  Location: [facility];   Service: Cardiovascular;  Laterality: N/A;   CARDIOVERSION N/A 04/24/2017   Procedure: CARDIOVERSION;  Surgeon: Larey Dresser, MD;  Location: [facility];  Service: Cardiovascular;  Laterality: N/A;   CARDIOVERSION N/A 06/08/2017   Procedure: CARDIOVERSION;  Surgeon: Pixie Casino, MD;  Location: [facility] ENDOSCOPY;  Service: Cardiovascular;  Laterality: N/A;   CARDIOVERSION N/A 08/24/2017   Procedure: CARDIOVERSION;  Surgeon: Skeet Latch, MD;  Location: [facility] ENDOSCOPY;  Service: Cardiovascular;  Laterality: N/A;   CARDIOVERSION N/A 02/14/2018   Procedure: CARDIOVERSION;  Surgeon: Larey Dresser, MD;  Location: [facility] ENDOSCOPY;  Service: Cardiovascular;  Laterality: N/A;   COLONOSCOPY     CRANIOTOMY N/A 07/28/2020   Procedure: CRANIOTOMY FOR EVACUATION OF SUBDURAL HEMATOMA;  Surgeon: Eustace Falcon, MD;  Location: [facility];  Service: Neurosurgery;  Laterality: N/A;   IR ANGIO VERTEBRAL SEL SUBCLAVIAN INNOMINATE UNI R MOD SED  08/07/2020   IR CT HEAD LTD  08/07/2020   IR PERCUTANEOUS ART THROMBECTOMY/INFUSION INTRACRANIAL INC DIAG ANGIO  08/07/2020   laminectomies     10/1975 and in 03/2005   RADIOLOGY WITH ANESTHESIA N/A 08/07/2020   Procedure: IR WITH ANESTHESIA;  Surgeon: Luanne Bras, MD;  Location: [facility];  Service: Radiology;  Laterality: N/A;   TEE WITHOUT CARDIOVERSION N/A 12/18/2020   Procedure: TRANSESOPHAGEAL ECHOCARDIOGRAM (TEE);  Surgeon: Larey Dresser, MD;  Location: [facility] ENDOSCOPY;  Service: Cardiovascular;  Laterality: N/A;   [place] Right 04/03/2018   Procedure: TOTAL HIP ARTHROPLASTY ANTERIOR APPROACH;  Surgeon: Paralee Cancel, MD;  Location: [facility];  Service: Orthopedics;  Laterality: Right;  70 mins    reports that he has never smoked. He has never used smokeless tobacco. He reports that he does not drink alcohol and does not use drugs. Social History   Socioeconomic History   Marital status: Married    Spouse name: Herbert Pun   Number of  children: 2   Years of education: BS   Highest education level: Not on file  Occupational History   Occupation: retired  Tobacco Use   Smoking status: Never   Smokeless tobacco: Never  Vaping Use   Vaping Use: Never used  Substance and Sexual Activity   Alcohol use: Never    Comment: wine occasionally   Drug use: Never   Sexual activity: Yes    Partners: Female  Other Topics Concern   Not on file  Social History Narrative   10/27/20 residing at [facility] care center since 09/17/20    [employer] - BS, JD. married - '67. 2 sons - '74, '77  one son is a Nurse, learning disability for [employer]; 4 grandchildren. 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 marriage in good health. End-of-life: discussed issues and provided packet with the MOST form and out of facility order.   Social Determinants of Health  Financial Resource Strain: Not on file  Food Insecurity: Not on file  Transportation Needs: Not on file  Physical Activity: Not on file  Stress: Not on file  Social Connections: Not on file  Intimate Partner Violence: Not on file    Functional Status Survey:    Family History  Problem Relation Age of Onset   Macular degeneration Mother        macular degeneration   Cancer Father        intestinal/GI   Diabetes Paternal Aunt    Heart attack Maternal Grandfather    Diabetes Brother     Health Maintenance  Topic Date Due   FOOT EXAM  Never done   URINE MICROALBUMIN  Never done   COVID-19 Vaccine (3 - Moderna risk series) 01/12/2021   OPHTHALMOLOGY EXAM  04/07/2021 (Originally 10/04/1950)   HEMOGLOBIN A1C  06/17/2021   TETANUS/TDAP  03/22/2028   Pneumonia Vaccine 23+ Years old  Completed   INFLUENZA VACCINE  Completed   Zoster Vaccines- Shingrix  Completed   HPV VACCINES  Aged Out   COLONOSCOPY (Pts 45-83yrs Insurance coverage will need to be confirmed)  Discontinued   Hepatitis C Screening   Discontinued    Allergies  Allergen Reactions   Naproxen Hives   No [facility] Food Allergies Other (See Comments)    Scallops - distress, nausea and vomitting    Allergies as of 02/01/2021       Reactions   Naproxen Hives   No [facility] Food Allergies Other (See Comments)   Scallops - distress, nausea and vomitting        Medication List        Accurate as of February 01, 2021 10:05 AM. If you have any questions, ask your nurse or doctor.          STOP taking these medications    acetaminophen 325 MG tablet Commonly known as: TYLENOL Stopped by: [name], MD   LORazepam 0.5 MG tablet Commonly known as: ATIVAN Stopped by: [name], MD       TAKE these medications    amantadine 50 MG/5ML solution Commonly known as: SYMMETREL Take 10 mLs (100 mg total) by mouth 2 (two) times daily. 8 AM and 2 PM   amLODipine 10 MG tablet Commonly known as: NORVASC Take 1 tablet (10 mg total) by mouth daily.   amoxicillin 400 MG/5ML suspension Commonly known as: AMOXIL Take 6.3 mLs (500 mg total) by mouth 2 (two) times daily. Please flavor.  Duration to be determined by infectious disease MD during outpatient follow-up.   apixaban 5 MG Tabs tablet Commonly known as: ELIQUIS Take 1 tablet (5 mg total) by mouth 2 (two) times daily.   atorvastatin 80 MG tablet Commonly known as: LIPITOR Take 1 tablet (80 mg total) by mouth daily.   BIOTENE MOISTURIZING MOUTH MT Use as directed 2 sprays in the mouth or throat in the morning, at noon, and at bedtime.   bisacodyl 10 MG suppository Commonly known as: DULCOLAX Place 1 suppository (10 mg total) rectally daily as needed for moderate constipation or mild constipation.   clobetasol cream 0.05 % Commonly known as: TEMOVATE Apply 1 application topically daily as needed (for skin irritation).   dofetilide 125 MCG capsule Commonly known as: TIKOSYN Take 3 capsules (375 mcg total) by mouth 2 (two) times  daily.   famotidine 20 MG tablet Commonly known as: PEPCID Take 20 mg by mouth at bedtime as needed for indigestion.  ferrous sulfate 220 (44 Fe) MG/5ML solution Take 240 mg by mouth every Monday, Wednesday, and Friday.   finasteride 5 MG tablet Commonly known as: PROSCAR Take 1 tablet (5 mg total) by mouth daily.   hydrocortisone cream 1 % Apply topically 2 (two) times daily as needed for itching (rash).   lacosamide 50 MG Tabs tablet Commonly known as: VIMPAT Take 1 tablet (50 mg total) by mouth 2 (two) times daily.   levalbuterol 0.63 MG/3ML nebulizer solution Commonly known as: XOPENEX Take 0.63 mg by nebulization every 6 (six) hours as needed for wheezing or shortness of breath.   loratadine 10 MG tablet Commonly known as: CLARITIN Take 10 mg by mouth at bedtime.   magnesium gluconate 500 MG tablet Commonly known as: MAGONATE Take 1 tablet (500 mg total) by mouth daily.   multivitamin with minerals Tabs tablet Take 1 tablet by mouth daily.   polyethylene glycol 17 g packet Commonly known as: MIRALAX / GLYCOLAX Take 17 g by mouth daily.   potassium chloride 10 MEQ tablet Commonly known as: KLOR-CON Take 2 tablets (20 mEq total) by mouth daily.   senna-docusate 8.6-50 MG tablet Commonly known as: Senokot S Take 1 tablet by mouth 2 (two) times daily.   Systane 0.4-0.3 % Soln Generic drug: Polyethyl Glycol-Propyl Glycol Place 2 drops into both eyes daily.   tamsulosin 0.4 MG Caps capsule Commonly known as: FLOMAX Take 0.4 mg by mouth daily.   torsemide 20 MG tablet Commonly known as: Demadex Take 1 tablet (20 mg total) by mouth daily.        Review of Systems  Unable to perform ROS: Other   Vitals:   02/01/21 0948  BP: 125/74  Pulse: (!) 103  Resp: 18  Temp: 98.2 F (36.8 C)  SpO2: 93%  Weight: 163 lb (73.9 kg)  Height: 5\' 7"  (1.702 m)   Body mass index is 25.53 kg/m. Physical Exam Vitals reviewed.  Constitutional:      Appearance:  Normal appearance.  HENT:     Head: Normocephalic.     Nose: Nose normal.     Mouth/Throat:     Mouth: Mucous membranes are moist.  Eyes:     Pupils: Pupils are equal, round, and reactive to light.  Cardiovascular:     Rate and Rhythm: Normal rate and regular rhythm.     Pulses: Normal pulses.     Heart sounds: Murmur heard.  Pulmonary:     Effort: Pulmonary effort is normal. No respiratory distress.     Breath sounds: Normal breath sounds.  Abdominal:     General: Abdomen is flat. Bowel sounds are normal.     Palpations: Abdomen is soft.  Musculoskeletal:        General: No swelling.     Cervical back: Neck supple.  Skin:    General: Skin is warm.  Neurological:     Mental Status: He is alert.     Comments: Alert responds with Few Sentences. Does not follow Commands Has left Hemiparesis   Psychiatric:        Mood and Affect: Mood normal.        Thought Content: Thought content normal.    Labs reviewed: Basic Metabolic Panel: Recent Labs    12/18/20 1630 12/19/20 0610 12/19/20 1632 12/20/20 0153 01/25/21 0623 01/25/21 0853 01/26/21 0435 01/27/21 0538  NA  --  139  --    < > 143  --  143 140  K  --  3.5  --    < >  3.6  --  3.8 3.5  CL  --  108  --    < > 107  --  104 107  CO2  --  23  --    < > 27  --  26 25  GLUCOSE  --  186*  --    < > 93  --  105* 137*  BUN  --  16  --    < > 11  --  12 15  CREATININE  --  0.76  --    < > 0.94  --  0.95 1.06  CALCIUM  --  7.7*  --    < > 8.2*  --  8.7* 8.7*  MG 1.9 2.0 1.9   < >  --  2.4 2.2 2.4  PHOS 3.1 2.7 2.4*  --   --   --   --   --    < > = values in this interval not displayed.   Liver Function Tests: Recent Labs    01/03/21 0221 01/08/21 0000 01/20/21 1631 01/23/21 1355  AST 32 26 27 37  ALT 63* 42* 35 34  ALKPHOS 94 120 115 107  BILITOT 0.4  --  0.6 0.7  PROT 6.3*  --  6.9 6.9  ALBUMIN 1.9* 2.8* 2.5* 2.4*   No results for input(s): LIPASE, AMYLASE in the last 8760 hours. Recent Labs    12/14/20 1639  01/24/21 0157  AMMONIA 25 22   CBC: Recent Labs    08/16/20 0427 08/17/20 0340 12/14/20 1245 12/14/20 1504 12/16/20 0130 12/18/20 0221 01/23/21 1355 01/24/21 0154 01/25/21 0623 01/26/21 0435  WBC 7.9   < > 12.0*   < > 10.7*   < > 10.6* 10.0  --  10.4  NEUTROABS 5.7  --  9.3*  --  7.7  --   --   --   --   --   HGB 9.0*   < > 11.7*   < > 9.9*   < > 7.7* 7.3* 7.4* 7.9*  HCT 28.2*   < > 36.8*   < > 30.7*   < > 26.5* 23.3* 24.4* 25.9*  MCV 98.6   < > 90.0   < > 88.0   < > 91.7 88.3  --  88.7  PLT 177   < > 119*   < > 106*   < > 448* 395  --  417*   < > = values in this interval not displayed.   Cardiac Enzymes: No results for input(s): CKTOTAL, CKMB, CKMBINDEX, TROPONINI in the last 8760 hours. BNP: Invalid input(s): POCBNP Lab Results  Component Value Date   HGBA1C 7.3 (H) 12/17/2020   Lab Results  Component Value Date   TSH 1.773 01/24/2021   Lab Results  Component Value Date   BJSEGBTD17 616 (H) 01/24/2021   Lab Results  Component Value Date   FOLATE 19.8 01/24/2021   Lab Results  Component Value Date   IRON 20 (L) 01/24/2021   TIBC 190 (L) 01/24/2021   FERRITIN 238 01/24/2021    Imaging and Procedures obtained prior to SNF admission: No results found.  Assessment/Plan Acute congestive heart failure, unspecified heart failure type (HCC) Down 20 lbs Now on Demadex Repeat Labs  Prosthetic valve endocarditis,  Remove PICc line On Amoxicillin PO now Repeat Echo Per Cardiology Follows with ID  Traumatic subdural hemorrhage with loss of consciousness of 1 hour to 5 hours 59 minutes, sequela ([place]) Restarted Therapy On Vimpat for  seizure Prophlaxis EEG done on 11/20   is suggestive of cortical dysfunction arising from  right frontotemporal region, nonspecific etiology but likely secondary to underlying stroke. 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 Repeat BMP Hyperlipidemia,  On statin Essential hypertension Continue Norvasc BPH On Proscar and Flomax  Family/ staff Communication:   Labs/tests ordered:BMP and MG level in 1 week

## 2021-02-01 NOTE — Progress Notes (Signed)
Primary Care Physician: [name], MD Primary Cardiologist: Dr [name] Primary Electrophysiologist: Dr Curt Bears Referring Physician: Tommye Standard PA   [name] [name] is a 80 y.o. male with a history of prosthetic valve endocarditis, rheumatic fever, diastolic heart failure, AVR MVR and maze in 2006, atypical atrial flutter, paroxysmal atrial fibrillation, CVA, atrial fibrillation who presents for follow up in the [facility]. He has baseline bedbound with left hemiparesis and minimal communication. 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  TTE did not show obvious vegetation but TEE showed bioprosthetic mitral valve vegetation with trivial MR and a mean gradient of 8.  He was treated with ceftriaxone/ampicillin with stop date of 01/28/2021.  He was then plan to continue on amoxicillin 500 mg twice daily indefinitely for chronic suppression.  His hospital course was complicated by encephalopathy and neurology was consulted with MRI brain showing a new left cerebellar vermis CVA that thought was thought to be secondary to his MV endocarditis and septic emboli.   He was readmitted 01/23/21 with acute on chronic diastolic CHF. EP was consulted for dofetilide management. Patient is on Eliquis for a CHADS2VASC score of 5. He is in SR today. He is minimally communicative and history provided by family.   Today, he denies symptoms of palpitations, chest pain, shortness of breath, orthopnea, PND, lower extremity edema, dizziness, presyncope, syncope, snoring, daytime somnolence, bleeding, or neurologic sequela. The patient is tolerating medications without difficulties and is otherwise without complaint today.    Atrial Fibrillation Risk Factors:  he does not have symptoms or diagnosis of sleep apnea. he does have a history of rheumatic  fever.   he has a BMI of Body mass index is 20.86 kg/m.[name] Filed Weights    Family History  Problem Relation Age of Onset   Macular degeneration Mother        macular degeneration   Cancer Father        intestinal/GI   Diabetes Paternal Aunt    Heart attack Maternal Grandfather    Diabetes Brother      Atrial Fibrillation Management history:  Previous antiarrhythmic drugs: dofetilide  Previous cardioversions: 2015, 2016, 2019 x 4 Previous ablations: remotely at [facility] score: 5 Anticoagulation history: Eliquis   Past Medical History:  Diagnosis Date   Acute rheumatic heart disease, unspecified    childhood, age  87 & 83   Acute rheumatic pericarditis    Atrial fibrillation ([place])    history   CHF (congestive heart failure) ([place])    Diverticulosis    Dysrhythmia    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   External hemorrhoids without mention of complication    H/O aortic valve replacement    H/O mitral valve replacement    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   Lesion of ulnar nerve    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    Other and unspecified hyperlipidemia    Pre-diabetes    Previous back surgery 1978, jan 2007   Psychosexual dysfunction with inhibited sexual excitement    SOB (shortness of breath)    "with heavy exercise"   Stroke ([place]) 08/2013   "I WAS IN AFIB AND THREW A CLOT, THE EFFECTS WERE TRANSITORY AND DIDNT LAST BUT FOR 73 MINUTES"    Thoracic aortic aneurysm    Past Surgical History:  Procedure Laterality Date  AORTIC AND MITRAL VALVE REPLACEMENT     09/2004   CARDIOVERSION     3 times from 2004-2006   CARDIOVERSION N/A 09/26/2013   Procedure: CARDIOVERSION;  Surgeon: Larey Dresser, MD;  Location: [place];  Service: Cardiovascular;  Laterality: N/A;   CARDIOVERSION N/A 06/19/2014   Procedure: CARDIOVERSION;  Surgeon: Jerline Pain, MD;  Location: [place];  Service: Cardiovascular;  Laterality: N/A;    CARDIOVERSION N/A 04/24/2017   Procedure: CARDIOVERSION;  Surgeon: Larey Dresser, MD;  Location: [place];  Service: Cardiovascular;  Laterality: N/A;   CARDIOVERSION N/A 06/08/2017   Procedure: CARDIOVERSION;  Surgeon: Pixie Casino, MD;  Location: [facility] ENDOSCOPY;  Service: Cardiovascular;  Laterality: N/A;   CARDIOVERSION N/A 08/24/2017   Procedure: CARDIOVERSION;  Surgeon: Skeet Latch, MD;  Location: [facility] ENDOSCOPY;  Service: Cardiovascular;  Laterality: N/A;   CARDIOVERSION N/A 02/14/2018   Procedure: CARDIOVERSION;  Surgeon: Larey Dresser, MD;  Location: [facility] ENDOSCOPY;  Service: Cardiovascular;  Laterality: N/A;   COLONOSCOPY     CRANIOTOMY N/A 07/28/2020   Procedure: CRANIOTOMY FOR EVACUATION OF SUBDURAL HEMATOMA;  Surgeon: Eustace Filler, MD;  Location: [place];  Service: Neurosurgery;  Laterality: N/A;   IR ANGIO VERTEBRAL SEL SUBCLAVIAN INNOMINATE UNI R MOD SED  08/07/2020   IR CT HEAD LTD  08/07/2020   IR PERCUTANEOUS ART THROMBECTOMY/INFUSION INTRACRANIAL INC DIAG ANGIO  08/07/2020   laminectomies     10/1975 and in 03/2005   RADIOLOGY WITH ANESTHESIA N/A 08/07/2020   Procedure: IR WITH ANESTHESIA;  Surgeon: Luanne Bras, MD;  Location: [place];  Service: Radiology;  Laterality: N/A;   TEE WITHOUT CARDIOVERSION N/A 12/18/2020   Procedure: TRANSESOPHAGEAL ECHOCARDIOGRAM (TEE);  Surgeon: Larey Dresser, MD;  Location: [facility] ENDOSCOPY;  Service: Cardiovascular;  Laterality: N/A;   [facility] Right 04/03/2018   Procedure: TOTAL HIP ARTHROPLASTY ANTERIOR APPROACH;  Surgeon: Paralee Cancel, MD;  Location: [facility];  Service: Orthopedics;  Laterality: Right;  70 mins    Current Outpatient Medications  Medication Sig Dispense Refill   acetaminophen (TYLENOL) 325 MG tablet Take 650 mg by mouth every 4 (four) hours as needed for fever or mild pain.     amantadine (SYMMETREL) 50 MG/5ML solution Take 10 mLs (100 mg total) by mouth 2 (two) times daily. 8  AM and 2 PM     amLODipine (NORVASC) 10 MG tablet Take 1 tablet (10 mg total) by mouth daily.     amoxicillin (AMOXIL) 400 MG/5ML suspension Take 6.3 mLs (500 mg total) by mouth 2 (two) times daily. Please flavor.  Duration to be determined by infectious disease MD during outpatient follow-up.     apixaban (ELIQUIS) 5 MG TABS tablet Take 1 tablet (5 mg total) by mouth 2 (two) times daily. 60 tablet 0   Artificial Saliva (BIOTENE MOISTURIZING MOUTH MT) Use as directed 2 sprays in the mouth or throat in the morning, at noon, and at bedtime.     atorvastatin (LIPITOR) 80 MG tablet Take 1 tablet (80 mg total) by mouth daily.     clobetasol cream (TEMOVATE) 2.68 % Apply 1 application topically daily as needed (for skin irritation).      dofetilide (TIKOSYN) 125 MCG capsule Take 3 capsules (375 mcg total) by mouth 2 (two) times daily.     famotidine (PEPCID) 20 MG tablet Take 20 mg by mouth at bedtime as needed for indigestion.     ferrous sulfate 220 (  44 Fe) MG/5ML solution Take 240 mg by mouth every Monday, Wednesday, and Friday.     finasteride (PROSCAR) 5 MG tablet Take 1 tablet (5 mg total) by mouth daily. 30 tablet 0   hydrocortisone cream 1 % Apply topically 2 (two) times daily as needed for itching (rash). 30 g 0   lacosamide (VIMPAT) 50 MG TABS tablet Take 1 tablet (50 mg total) by mouth 2 (two) times daily. 60 tablet    loratadine (CLARITIN) 10 MG tablet Take 10 mg by mouth at bedtime.     LORazepam (ATIVAN) 0.5 MG tablet Take 0.5 tablets (0.25 mg total) by mouth every 6 (six) hours as needed for up to 14 days for anxiety (x 72 hours). 15 tablet 0   magnesium gluconate (MAGONATE) 500 MG tablet Take 1 tablet (500 mg total) by mouth daily. 30 tablet 0   Multiple Vitamin (MULTIVITAMIN WITH MINERALS) TABS tablet Take 1 tablet by mouth daily.     Polyethyl Glycol-Propyl Glycol (SYSTANE) 0.4-0.3 % SOLN Place 2 drops into both eyes daily.     polyethylene glycol (MIRALAX / GLYCOLAX) 17 g packet Take  17 g by mouth daily. (Patient taking differently: Take 17 g by mouth as needed.)     potassium chloride (KLOR-CON) 10 MEQ tablet Take 2 tablets (20 mEq total) by mouth daily.     senna-docusate (SENOKOT S) 8.6-50 MG tablet Take 1 tablet by mouth 2 (two) times daily.     tamsulosin (FLOMAX) 0.4 MG CAPS capsule Take 0.4 mg by mouth daily.     torsemide (DEMADEX) 20 MG tablet Take 1 tablet (20 mg total) by mouth daily. 30 tablet 11   bisacodyl (DULCOLAX) 10 MG suppository Place 1 suppository (10 mg total) rectally daily as needed for moderate constipation or mild constipation.     levalbuterol (XOPENEX) 0.63 MG/3ML nebulizer solution Take 0.63 mg by nebulization every 6 (six) hours as needed for wheezing or shortness of breath. (Patient not taking: Reported on 02/01/2021)     No current facility-administered medications for this encounter.    Allergies  Allergen Reactions   Naproxen Hives   No [facility] Food Allergies Other (See Comments)    Scallops - distress, nausea and vomitting    Social History   Socioeconomic History   Marital status: Married    Spouse name: Herbert Pun   Number of children: 2   Years of education: BS   Highest education level: Not on file  Occupational History   Occupation: retired  Tobacco Use   Smoking status: Never   Smokeless tobacco: Never  Vaping Use   Vaping Use: Never used  Substance and Sexual Activity   Alcohol use: Never    Comment: wine occasionally   Drug use: Never   Sexual activity: Yes    Partners: Female  Other Topics Concern   Not on file  Social History Narrative   10/27/20 residing at [facility] care center since 09/17/20    [employer] - BS, JD. married - '67. 2 sons - '74, '77  one son is a Nurse, learning disability for [employer]; 4 grandchildren. 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 marriage in good health. End-of-life: discussed issues and provided packet  with the MOST form and out of facility order.   Social Determinants of Health   Financial Resource Strain: Not on file  Food Insecurity: Not on file  Transportation Needs: Not on file  Physical Activity: Not  on file  Stress: Not on file  Social Connections: Not on file  Intimate Partner Violence: Not on file     ROS- All systems are reviewed and negative except as per the HPI above.  Physical Exam: Vitals:   02/01/21 0912  BP: 120/66  Pulse: 89  Height: 5\' 7"  (1.702 m)    GEN- The patient is a well appearing elderly male, alert and oriented x 3 today.   Head- normocephalic, atraumatic Eyes-  Sclera clear, conjunctiva pink Ears- hearing intact Oropharynx- clear Neck- supple  Lungs- Clear to ausculation bilaterally, normal work of breathing Heart- Regular rate and rhythm, no murmurs, rubs or gallops  GI- soft, NT, ND, + BS Extremities- no clubbing, cyanosis, or edema MS- no significant deformity or atrophy Skin- no rash or lesion Psych- euthymic mood, full affect Neuro- left side hemiparesis   Wt Readings from Last 3 Encounters:  01/27/21 60.4 kg  01/20/21 83.8 kg  01/20/21 83.8 kg    EKG today demonstrates  SR with 1st degree AV block Vent. rate 89 BPM PR interval * ms QRS duration 112 ms QT/QTcB 402/489 ms  Echo 12/15/20 demonstrated  1. The mitral valve has been replaced with an unknown bioprosthesis. No evidence of mitral valve regurgitation. The mean mitral valve gradient is 10.0 mmHg with average heart rate of 101 bpm. PHT 70 ms. Suboptimal LVOT spectral Doppler- VTI deferred. Date of Procedure Date: 10/18/2004.   2. The aortic valve has been repaired/replaced. Aortic valve  regurgitation is not visualized. 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 Procedure Date: 10/18/2004. Aortic valve mean gradient measures 16.0 mmHg.  Aortic valve acceleration time measures 74 msec.   3. Left ventricular ejection fraction, by  estimation, is 65 to 70%. The left ventricle has normal function. The left ventricle has no regional wall motion abnormalities. There is mild concentric left ventricular hypertrophy. Left ventricular diastolic parameters are indeterminate.   4. Aortic dilatation noted. There is moderate dilatation of the aortic  root, measuring 48 mm. There is moderate dilatation of the ascending aorta, measuring 49 mm.   5. Right ventricular systolic function is normal. The right ventricular  size is normal.   Comparison(s): A prior study was performed on 08/07/2020. Prior images reviewed side by side. Stable aortic root and ascending aorta size. Increased mitral valve gradients, but at higher hear rates from prior.   Epic records are reviewed at length today  CHA2DS2-VASc Score = 5  The patient's score is based upon: CHF History: 0 HTN History: 1 Diabetes History: 0 Stroke History: 2 Vascular Disease History: 0 Age Score: 2 Gender Score: 0       ASSESSMENT AND PLAN: 1. Persistent Atrial Fibrillation/atrial flutter The patient's CHA2DS2-VASc score is 5, indicating a 7.2% annual risk of stroke.   Patient in [place] today. He will not need DCCV.  Continue dofetilide 375 mcg BID. QT stable. Continue Eliquis 5 mg BID  2. Secondary Hypercoagulable State (ICD10:  D68.69) The patient is at significant risk for stroke/thromboembolism based upon his CHA2DS2-VASc Score of 5.  Continue Apixaban (Eliquis).   3. Chronic diastolic CHF No signs or symptoms of fluid overload today. Unable to weight today.   4. 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    [facility] [address] [place], [place] [address] [phone] 02/01/2021 9:44 AM

## 2021-02-02 DIAGNOSIS — R41841 Cognitive communication deficit: Secondary | ICD-10-CM | POA: Diagnosis not present

## 2021-02-02 DIAGNOSIS — R41840 Attention and concentration deficit: Secondary | ICD-10-CM | POA: Diagnosis not present

## 2021-02-02 DIAGNOSIS — I69391 Dysphagia following cerebral infarction: Secondary | ICD-10-CM | POA: Diagnosis not present

## 2021-02-02 DIAGNOSIS — S065X3S Traumatic subdural hemorrhage with loss of consciousness of 1 hour to 5 hours 59 minutes, sequela: Secondary | ICD-10-CM | POA: Diagnosis not present

## 2021-02-02 DIAGNOSIS — I69812 Visuospatial deficit and spatial neglect following other cerebrovascular disease: Secondary | ICD-10-CM | POA: Diagnosis not present

## 2021-02-02 DIAGNOSIS — R2689 Other abnormalities of gait and mobility: Secondary | ICD-10-CM | POA: Diagnosis not present

## 2021-02-02 DIAGNOSIS — G9341 Metabolic encephalopathy: Secondary | ICD-10-CM | POA: Diagnosis not present

## 2021-02-02 DIAGNOSIS — I5022 Chronic systolic (congestive) heart failure: Secondary | ICD-10-CM | POA: Diagnosis not present

## 2021-02-02 DIAGNOSIS — R293 Abnormal posture: Secondary | ICD-10-CM | POA: Diagnosis not present

## 2021-02-02 DIAGNOSIS — M6389 Disorders of muscle in diseases classified elsewhere, multiple sites: Secondary | ICD-10-CM | POA: Diagnosis not present

## 2021-02-02 DIAGNOSIS — R278 Other lack of coordination: Secondary | ICD-10-CM | POA: Diagnosis not present

## 2021-02-02 DIAGNOSIS — H538 Other visual disturbances: Secondary | ICD-10-CM | POA: Diagnosis not present

## 2021-02-02 DIAGNOSIS — I5032 Chronic diastolic (congestive) heart failure: Secondary | ICD-10-CM | POA: Diagnosis not present

## 2021-02-02 DIAGNOSIS — I6601 Occlusion and stenosis of right middle cerebral artery: Secondary | ICD-10-CM | POA: Diagnosis not present

## 2021-02-02 DIAGNOSIS — I69354 Hemiplegia and hemiparesis following cerebral infarction affecting left non-dominant side: Secondary | ICD-10-CM | POA: Diagnosis not present

## 2021-02-03 ENCOUNTER — Ambulatory Visit (INDEPENDENT_AMBULATORY_CARE_PROVIDER_SITE_OTHER): Payer: Medicare Other | Admitting: Neurology

## 2021-02-03 ENCOUNTER — Encounter: Payer: Self-pay | Admitting: Neurology

## 2021-02-03 ENCOUNTER — Other Ambulatory Visit: Payer: Self-pay

## 2021-02-03 VITALS — BP 98/67 | HR 98 | Ht 67.0 in | Wt 163.0 lb

## 2021-02-03 DIAGNOSIS — I639 Cerebral infarction, unspecified: Secondary | ICD-10-CM | POA: Diagnosis not present

## 2021-02-03 DIAGNOSIS — I69354 Hemiplegia and hemiparesis following cerebral infarction affecting left non-dominant side: Secondary | ICD-10-CM | POA: Diagnosis not present

## 2021-02-03 DIAGNOSIS — I5032 Chronic diastolic (congestive) heart failure: Secondary | ICD-10-CM | POA: Diagnosis not present

## 2021-02-03 DIAGNOSIS — I63511 Cerebral infarction due to unspecified occlusion or stenosis of right middle cerebral artery: Secondary | ICD-10-CM

## 2021-02-03 DIAGNOSIS — H538 Other visual disturbances: Secondary | ICD-10-CM | POA: Diagnosis not present

## 2021-02-03 DIAGNOSIS — G9341 Metabolic encephalopathy: Secondary | ICD-10-CM | POA: Diagnosis not present

## 2021-02-03 DIAGNOSIS — S065X3S Traumatic subdural hemorrhage with loss of consciousness of 1 hour to 5 hours 59 minutes, sequela: Secondary | ICD-10-CM | POA: Diagnosis not present

## 2021-02-03 DIAGNOSIS — R251 Tremor, unspecified: Secondary | ICD-10-CM | POA: Diagnosis not present

## 2021-02-03 DIAGNOSIS — R278 Other lack of coordination: Secondary | ICD-10-CM | POA: Diagnosis not present

## 2021-02-03 DIAGNOSIS — I330 Acute and subacute infective endocarditis: Secondary | ICD-10-CM

## 2021-02-03 DIAGNOSIS — R293 Abnormal posture: Secondary | ICD-10-CM | POA: Diagnosis not present

## 2021-02-03 DIAGNOSIS — M6389 Disorders of muscle in diseases classified elsewhere, multiple sites: Secondary | ICD-10-CM | POA: Diagnosis not present

## 2021-02-03 DIAGNOSIS — I69812 Visuospatial deficit and spatial neglect following other cerebrovascular disease: Secondary | ICD-10-CM | POA: Diagnosis not present

## 2021-02-03 DIAGNOSIS — I69391 Dysphagia following cerebral infarction: Secondary | ICD-10-CM | POA: Diagnosis not present

## 2021-02-03 DIAGNOSIS — R41841 Cognitive communication deficit: Secondary | ICD-10-CM | POA: Diagnosis not present

## 2021-02-03 DIAGNOSIS — I6601 Occlusion and stenosis of right middle cerebral artery: Secondary | ICD-10-CM | POA: Diagnosis not present

## 2021-02-03 DIAGNOSIS — R41840 Attention and concentration deficit: Secondary | ICD-10-CM | POA: Diagnosis not present

## 2021-02-03 DIAGNOSIS — I5022 Chronic systolic (congestive) heart failure: Secondary | ICD-10-CM | POA: Diagnosis not present

## 2021-02-03 MED ORDER — AMANTADINE HCL 50 MG/5ML PO SOLN
50.0000 mg | Freq: Two times a day (BID) | ORAL | Status: DC
Start: 2021-02-03 — End: 2021-02-11

## 2021-02-03 NOTE — Progress Notes (Signed)
[facility] [address] [place] [address] [phone]       HOSPITAL FOLLOW UP NOTE  [name] Date of Birth:  10-10-1940 Medical Record Number:  [id number]   Reason for Referral:  hospital stroke follow up    SUBJECTIVE:   CHIEF COMPLAINT:  Chief Complaint  Patient presents with   Follow-up    Rm 17. Patient presents for a follow up.    HPI:   [name] [name] is a 80 y.o. male with PMHx of HTN, HLD, DM, hx of prior stroke 2015, aortic valve replacement, mitral valve replacement, acute rheumatic pericarditis (age 13/7), diastolic CHF, AVR/MVR and A. fib/A-flutter on Coumadin/Tikosyn who presented on 07/26/2020 after a fall at [place] falling backwards and striking his head - ED eval no acute findings and discharged home. Returned on 5/23 with difficulty speaking and right-sided weakness with imaging now showing large right greater than left traumatic subdural hemorrhage with mass-effect and subfalcine herniation.  Initially poor prognosis with family electing comfort care measures however patient started showing improvement and taken the OR for right craniotomy with evacuation of SDH by Dr. Ronnald Ramp.  Continue to be limited by left greater than right-sided weakness with visual deficits, poor safety awareness, delirium and cognitive deficits.  CIR recommended and discharged on 07/31/2020.  On 6/3, he was found to have decreased LOC with left-sided weakness and right inattention.  Code stroke activated and CTA showed occluded right ICA s/p cerebral angio with revascularization of occluded right ICA terminus. MR brain revealed acute infarct throughout majority of right MCA territory with some sparing of anterior insula and frontal operculum, petechial blood products and mass-effect with 7.5 mm right to left shift.  Mentation initially improving however repeat CT head showed 9 mm midline shift -hypertonic saline initiated.  Repeat CT head 6/13 slightly increased  leftward midline shift measuring 7 mm.  Evaluated by Dr. [name] who felt R ICA occlusion possibly in setting of A. fib with interruption of AC in setting of recent bleed -aspirin 81 mg daily initiated in consideration of initiating DOAC 10 days post stroke.  EF 60 to 65% with severely dilated LA.  LDL 56.  A1c 6.2.  Residual deficits of dysarthria, aphasia, dysphagia (NPO status), and left hemiparesis -discharged back to CIR on 6/11 - 7/14. CIR admission significant for UTI tx'd with Keflex, facial twitching with negative EEG but started on Keppra 500 mg twice daily for seizure prophylaxis, and repeat CT head 6/20 showed decrease edema and Eliquis initiated 6/21.  Progress limited by bouts of lethargy, restlessness and cognitive deficits -family elected SNF for progressive therapy and discharged to [facility] 7/14.   Last visit 10/27/2020, [name] [name] is being seen for hospital follow-up accompanied by his wife.  He continues to reside at [facility] currently receiving PT/OT/SLP for residual left hemiparesis, gait impairment/imbalance, dysphagia, cognitive impairment and speech difficulties.  Wife provides majority of history. She does report improvement since discharge.  He is able to ambulate short distance during therapy sessions only otherwise transfers via [place].  Mental status fluctuates.  Swallowing has improved currently on mechanical soft diet and NTL without difficulty and has good appetite.  He is able to feed himself.  She denies any word finding or slurred speech but more issues with vocal intensity.  Currently being followed by [facility] for retention issues with recent use of Foley catheter but is now currently being straight cathed as he was consistently pulling on catheter tubing.  Denies new stroke/TIA symptoms. Per MAR review, remains on Eliquis and atorvastatin.  Also remains on Keppra 500 mg twice daily. No seizure activity noted.  Blood pressure today 120/75. She questions time  frame of repeat imaging, medications to help with cognition, and expectations of recovery.  No further concerns at this time.   Update 02/03/21 :  He returns for follow-up after last visit 3 months ago.  Is accompanied by his wife.  Patient has been hospitalized twice since then.  He was hospitalized from October 13 at [facility] with dehydration, hyponatremia, urinary tract infection and Enterococcus bacteremia with endocarditis.  MRI scan showed a small cerebellar infarct.  Patient was quite lethargic during hospitalization this was felt to be related to [place] and it was changed to Vimpat.  He was back in the nursing home but returned for an admission from November 19 to 23 for exacerbation of congestive heart failure.  Is found to have pulmonary edema and was diuresed.  He is now back in the nursing home.  He still wheelchair-bound.  Has been going to work with physical therapist but is still not able to walk.  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  He has had no breakthrough seizures.  Patient had developed significant tremors in the extremities as this was felt to be mentating which was started for promoting wakefulness and had reduced the dose to 100 milligrams twice daily which has shown some reduction in his tremors however tremor still persist.  He is back on Eliquis for stroke prevention for his A. fib and is having minor bruising but no bleeding episodes.  Blood pressures well controlled today it is 98/67.  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   Lesion of ulnar nerve    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    Other and unspecified hyperlipidemia    Pre-diabetes    Previous back surgery 1978, jan 2007   Psychosexual dysfunction with inhibited sexual excitement    SOB (shortness of breath)    "with heavy exercise"   Stroke ([place]) 08/2013   "I WAS IN AFIB AND THREW A CLOT, THE EFFECTS WERE TRANSITORY AND DIDNT LAST BUT FOR 30 MINUTES"    Thoracic aortic aneurysm     PSH:  Past Surgical History:  Procedure Laterality Date   AORTIC AND MITRAL VALVE REPLACEMENT     09/2004   CARDIOVERSION     3 times from 2004-2006   CARDIOVERSION N/A 09/26/2013   Procedure: CARDIOVERSION;  Surgeon: Larey Dresser, MD;  Location: [facility];  Service: Cardiovascular;  Laterality: N/A;   CARDIOVERSION N/A 06/19/2014   Procedure: CARDIOVERSION;  Surgeon: Jerline Pain, MD;  Location: [facility] ENDOSCOPY;  Service: Cardiovascular;  Laterality:  N/A;   CARDIOVERSION N/A 04/24/2017   Procedure: CARDIOVERSION;  Surgeon: Larey Dresser, MD;  Location: [facility] ENDOSCOPY;  Service: Cardiovascular;  Laterality: N/A;   CARDIOVERSION N/A 06/08/2017   Procedure: CARDIOVERSION;  Surgeon: Pixie Casino, MD;  Location: [facility] ENDOSCOPY;  Service: Cardiovascular;  Laterality: N/A;   CARDIOVERSION N/A 08/24/2017   Procedure: CARDIOVERSION;  Surgeon: Skeet Latch, MD;  Location: [facility] ENDOSCOPY;  Service: Cardiovascular;  Laterality: N/A;   CARDIOVERSION N/A 02/14/2018   Procedure: CARDIOVERSION;  Surgeon: Larey Dresser, MD;  Location: [facility] ENDOSCOPY;  Service: Cardiovascular;  Laterality: N/A;   COLONOSCOPY     CRANIOTOMY N/A 07/28/2020   Procedure: CRANIOTOMY FOR EVACUATION OF SUBDURAL HEMATOMA;  Surgeon: Eustace Wrenn, MD;  Location: [facility];  Service: Neurosurgery;   Laterality: N/A;   IR ANGIO VERTEBRAL SEL SUBCLAVIAN INNOMINATE UNI R MOD SED  08/07/2020   IR CT HEAD LTD  08/07/2020   IR PERCUTANEOUS ART THROMBECTOMY/INFUSION INTRACRANIAL INC DIAG ANGIO  08/07/2020   laminectomies     10/1975 and in 03/2005   RADIOLOGY WITH ANESTHESIA N/A 08/07/2020   Procedure: IR WITH ANESTHESIA;  Surgeon: Luanne Bras, MD;  Location: [facility];  Service: Radiology;  Laterality: N/A;   TEE WITHOUT CARDIOVERSION N/A 12/18/2020   Procedure: TRANSESOPHAGEAL ECHOCARDIOGRAM (TEE);  Surgeon: Larey Dresser, MD;  Location: [facility] ENDOSCOPY;  Service: Cardiovascular;  Laterality: N/A;   [place] Right 04/03/2018   Procedure: TOTAL HIP ARTHROPLASTY ANTERIOR APPROACH;  Surgeon: Paralee Cancel, MD;  Location: [facility];  Service: Orthopedics;  Laterality: Right;  70 mins    Social History:  Social History   Socioeconomic History   Marital status: Married    Spouse name: Herbert Pun   Number of children: 2   Years of education: BS   Highest education level: Not on file  Occupational History   Occupation: retired  Tobacco Use   Smoking status: Never   Smokeless tobacco: Never  Vaping Use   Vaping Use: Never used  Substance and Sexual Activity   Alcohol use: Never    Comment: wine occasionally   Drug use: Never   Sexual activity: Yes    Partners: Female  Other Topics Concern   Not on file  Social History Narrative   10/27/20 residing at [facility] care center since 09/17/20    [employer] - BS, JD. married - '67. 2 sons - '74, '77  one son is a Nurse, learning disability for [employer]; 4 grandchildren. 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 marriage in good health. End-of-life: discussed issues and provided packet with the MOST form and out of facility order.   Social Determinants of Health   Financial Resource Strain: Not on file  Food Insecurity: Not on file   Transportation Needs: Not on file  Physical Activity: Not on file  Stress: Not on file  Social Connections: Not on file  Intimate Partner Violence: Not on file    Family History:  Family History  Problem Relation Age of Onset   Macular degeneration Mother        macular degeneration   Cancer Father        intestinal/GI   Diabetes Paternal Aunt    Heart attack Maternal Grandfather    Diabetes Brother     Medications:   Current Outpatient Medications on File Prior to Visit  Medication Sig Dispense Refill  amLODipine (NORVASC) 10 MG tablet Take 1 tablet (10 mg total) by mouth daily.     amoxicillin (AMOXIL) 400 MG/5ML suspension Take 6.3 mLs (500 mg total) by mouth 2 (two) times daily. Please flavor.  Duration to be determined by infectious disease MD during outpatient follow-up.     apixaban (ELIQUIS) 5 MG TABS tablet Take 1 tablet (5 mg total) by mouth 2 (two) times daily. 60 tablet 0   Artificial Saliva (BIOTENE MOISTURIZING MOUTH MT) Use as directed 2 sprays in the mouth or throat in the morning, at noon, and at bedtime.     atorvastatin (LIPITOR) 80 MG tablet Take 1 tablet (80 mg total) by mouth daily.     bisacodyl (DULCOLAX) 10 MG suppository Place 1 suppository (10 mg total) rectally daily as needed for moderate constipation or mild constipation.     clobetasol cream (TEMOVATE) 1.54 % Apply 1 application topically daily as needed (for skin irritation).      dofetilide (TIKOSYN) 125 MCG capsule Take 3 capsules (375 mcg total) by mouth 2 (two) times daily.     famotidine (PEPCID) 20 MG tablet Take 20 mg by mouth at bedtime as needed for indigestion.     ferrous sulfate 220 (44 Fe) MG/5ML solution Take 240 mg by mouth every Monday, Wednesday, and Friday.     finasteride (PROSCAR) 5 MG tablet Take 1 tablet (5 mg total) by mouth daily. 30 tablet 0   hydrocortisone cream 1 % Apply topically 2 (two) times daily as needed for itching (rash). 30 g 0   lacosamide (VIMPAT) 50 MG TABS  tablet Take 1 tablet (50 mg total) by mouth 2 (two) times daily. 60 tablet    levalbuterol (XOPENEX) 0.63 MG/3ML nebulizer solution Take 0.63 mg by nebulization every 6 (six) hours as needed for wheezing or shortness of breath.     loratadine (CLARITIN) 10 MG tablet Take 10 mg by mouth at bedtime.     magnesium gluconate (MAGONATE) 500 MG tablet Take 1 tablet (500 mg total) by mouth daily. 30 tablet 0   Multiple Vitamin (MULTIVITAMIN WITH MINERALS) TABS tablet Take 1 tablet by mouth daily.     Polyethyl Glycol-Propyl Glycol (SYSTANE) 0.4-0.3 % SOLN Place 2 drops into both eyes daily.     polyethylene glycol (MIRALAX / GLYCOLAX) 17 g packet Take 17 g by mouth daily.     potassium chloride (KLOR-CON) 10 MEQ tablet Take 2 tablets (20 mEq total) by mouth daily.     senna-docusate (SENOKOT S) 8.6-50 MG tablet Take 1 tablet by mouth 2 (two) times daily.     tamsulosin (FLOMAX) 0.4 MG CAPS capsule Take 0.4 mg by mouth daily.     torsemide (DEMADEX) 20 MG tablet Take 1 tablet (20 mg total) by mouth daily. 30 tablet 11   No current facility-administered medications on file prior to visit.    Allergies:   Allergies  Allergen Reactions   Naproxen Hives   No [place] Food Allergies Other (See Comments)    Scallops - distress, nausea and vomitting      OBJECTIVE:  Physical Exam  Vitals:   02/03/21 1515  BP: 98/67  Pulse: 98  Weight: 163 lb (73.9 kg)  Height: 5\' 7"  (1.702 m)   Body mass index is 25.53 kg/m. No results found.  General: well developed, well nourished, pleasant elderly Caucasian male, seated, in no evident distress Head: head normocephalic and atraumatic.   Neck: supple with no carotid or supraclavicular bruits Cardiovascular: regular rate and  rhythm, no murmurs Musculoskeletal: no deformity Skin:  no rash/petichiae Vascular:  Normal pulses all extremities   Neurologic Exam Mental Status: Awake alert interactive.  Limited verbal output with hypophonia and possible  underlying aphasia.  Delayed processing and responses.  Some difficulty following simple commands .  Oriented to self but disoriented to place and time. Recent and remote memory impaired. Attention span, concentration and fund of knowledge impaired. Mood and affect flat.  Cranial Nerves: Fundoscopic exam not done. Pupils equal, briskly reactive to light.  Right gaze preference.  Unable to look to the left past midline..  Left inattention.  Does not blink to threat on left.  Left homonymous hemianopsia hearing intact. Facial sensation intact.  Left lower facial weakness.  Tongue and palate moves normally and symmetrically.  Motor: Spastic left hemiplegia with left upper extremity grade 3/5 strength weakness of left grip and intrinsic hand muscles.  Left lower extremity strength is 2/5.  Tone is increased on the left with spasticity.  Good antigravity strength on the right.   Sensory.:  Preserved bilaterally.   Coordination: Impaired on the left Gait and Station: Deferred Reflexes: 2+ and asymmetric and brisker on the left. Toes downgoing.     NIHSS  13 Modified Rankin  4      ASSESSMENT: [name] [name] is a 80 y.o. year old male with PMHx of atrial fibrillation, aortic valve replacement, mitral valve replacement, hx of stroke (2015, 2/2 a.fib without deficit), HTN, HLD and DM who presented on 5/22 after a fall with imaging unremarkable.  He was d/c'd and return the following day with garbled speech and difficulty moving with imaging now demonstrating very large R SDH and smaller L SDH with mass defect, subfalcine herniation and leftward midline shift with early left lateral ventricle entrapment.  Placed on comfort care due to poor prognosis but due to improvement, underwent craniotomy for evacuation of right SDH.  Warfarin placed on hold.  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 fib with  interruption of AC due to recent traumatic SDH.  Recent hospitalizations in October 2022 due to dehydration, hyponatremia, UTI and bacteremia as well as in November 2022 due to acute congestive heart failure.     PLAN: I had a long discussion the patient and his wife regarding his recent hospitalizations for sepsis, endocarditis, stroke and heart failure exacerbation.  We also discussed his tremors which seem to have improved after reducing the dose of amantadine and so recommend reducing the dose further to 50 mg twice daily for 1 week and then 50 mg daily for a week and stop.  Continue Vimpat at the current dose of 50 mg twice daily for seizure prophylaxis and Eliquis 5 mg twice daily for his atrial fibrillation for stroke prevention.  Continue ongoing physical and occupational therapy.  He will return for follow-up in the future in 3 months with my nurse practitioner on call earlier if necessary.  Greater than 50% time during the 30-minute visit was spent on counseling and coordination of care about his recurrent strokes and recent hospitalization and answering questions  Antony Contras, MD [facility] [address] [place], [place] [address]  Phone [phone] Fax [phone] Note: This document was prepared with digital dictation and possible smart phrase technology. Any transcriptional errors that result from this process are unintentional.

## 2021-02-03 NOTE — Patient Instructions (Signed)
I had a long discussion the patient and his wife regarding his recent hospitalizations for sepsis, endocarditis, stroke and heart failure exacerbation.  We also discussed his tremors which seem to have improved after reducing the dose of amantadine and so recommend reducing the dose further to 50 mg twice daily for 1 week and then 50 mg daily for a week and stop.  Continue Vimpat at the current dose of 50 mg twice daily for seizure prophylaxis and Eliquis 5 mg twice daily for his atrial fibrillation for stroke prevention.  Continue ongoing physical and occupational therapy.  He will return for follow-up in the future in 3 months with my nurse practitioner on call earlier if necessary.

## 2021-02-04 ENCOUNTER — Other Ambulatory Visit: Payer: Self-pay | Admitting: Adult Health

## 2021-02-04 DIAGNOSIS — R41840 Attention and concentration deficit: Secondary | ICD-10-CM | POA: Diagnosis not present

## 2021-02-04 DIAGNOSIS — I6601 Occlusion and stenosis of right middle cerebral artery: Secondary | ICD-10-CM | POA: Diagnosis not present

## 2021-02-04 DIAGNOSIS — G9341 Metabolic encephalopathy: Secondary | ICD-10-CM | POA: Diagnosis not present

## 2021-02-04 DIAGNOSIS — R41841 Cognitive communication deficit: Secondary | ICD-10-CM | POA: Diagnosis not present

## 2021-02-04 DIAGNOSIS — R293 Abnormal posture: Secondary | ICD-10-CM | POA: Diagnosis not present

## 2021-02-04 DIAGNOSIS — R278 Other lack of coordination: Secondary | ICD-10-CM | POA: Diagnosis not present

## 2021-02-04 DIAGNOSIS — H538 Other visual disturbances: Secondary | ICD-10-CM | POA: Diagnosis not present

## 2021-02-04 DIAGNOSIS — I69391 Dysphagia following cerebral infarction: Secondary | ICD-10-CM | POA: Diagnosis not present

## 2021-02-04 DIAGNOSIS — I5022 Chronic systolic (congestive) heart failure: Secondary | ICD-10-CM | POA: Diagnosis not present

## 2021-02-04 DIAGNOSIS — I5032 Chronic diastolic (congestive) heart failure: Secondary | ICD-10-CM | POA: Diagnosis not present

## 2021-02-04 DIAGNOSIS — S065X3S Traumatic subdural hemorrhage with loss of consciousness of 1 hour to 5 hours 59 minutes, sequela: Secondary | ICD-10-CM | POA: Diagnosis not present

## 2021-02-04 DIAGNOSIS — I69354 Hemiplegia and hemiparesis following cerebral infarction affecting left non-dominant side: Secondary | ICD-10-CM | POA: Diagnosis not present

## 2021-02-04 DIAGNOSIS — R2689 Other abnormalities of gait and mobility: Secondary | ICD-10-CM | POA: Diagnosis not present

## 2021-02-04 DIAGNOSIS — I69812 Visuospatial deficit and spatial neglect following other cerebrovascular disease: Secondary | ICD-10-CM | POA: Diagnosis not present

## 2021-02-04 DIAGNOSIS — M6389 Disorders of muscle in diseases classified elsewhere, multiple sites: Secondary | ICD-10-CM | POA: Diagnosis not present

## 2021-02-04 MED ORDER — LACOSAMIDE 50 MG PO TABS
50.0000 mg | ORAL_TABLET | Freq: Two times a day (BID) | ORAL | 3 refills | Status: DC
Start: 2021-02-04 — End: 2021-05-31

## 2021-02-05 ENCOUNTER — Ambulatory Visit (HOSPITAL_COMMUNITY)
Admission: RE | Admit: 2021-02-05 | Discharge: 2021-02-05 | Disposition: A | Discharge status: Home / Self Care | Payer: Medicare Other | Source: Ambulatory Visit | Attending: Cardiology | Admitting: Cardiology

## 2021-02-05 VITALS — BP 130/76 | HR 92

## 2021-02-05 DIAGNOSIS — Z792 Long term (current) use of antibiotics: Secondary | ICD-10-CM | POA: Insufficient documentation

## 2021-02-05 DIAGNOSIS — I7121 Aneurysm of the ascending aorta, without rupture: Secondary | ICD-10-CM | POA: Diagnosis not present

## 2021-02-05 DIAGNOSIS — I330 Acute and subacute infective endocarditis: Secondary | ICD-10-CM | POA: Diagnosis not present

## 2021-02-05 DIAGNOSIS — I69391 Dysphagia following cerebral infarction: Secondary | ICD-10-CM | POA: Diagnosis not present

## 2021-02-05 DIAGNOSIS — Z7901 Long term (current) use of anticoagulants: Secondary | ICD-10-CM | POA: Diagnosis not present

## 2021-02-05 DIAGNOSIS — R41840 Attention and concentration deficit: Secondary | ICD-10-CM | POA: Diagnosis not present

## 2021-02-05 DIAGNOSIS — Z8249 Family history of ischemic heart disease and other diseases of the circulatory system: Secondary | ICD-10-CM | POA: Diagnosis not present

## 2021-02-05 DIAGNOSIS — I440 Atrioventricular block, first degree: Secondary | ICD-10-CM | POA: Diagnosis not present

## 2021-02-05 DIAGNOSIS — Y838 Other surgical procedures as the cause of abnormal reaction of the patient, or of later complication, without mention of misadventure at the time of the procedure: Secondary | ICD-10-CM | POA: Insufficient documentation

## 2021-02-05 DIAGNOSIS — Z7401 Bed confinement status: Secondary | ICD-10-CM | POA: Insufficient documentation

## 2021-02-05 DIAGNOSIS — B952 Enterococcus as the cause of diseases classified elsewhere: Secondary | ICD-10-CM | POA: Diagnosis not present

## 2021-02-05 DIAGNOSIS — I69354 Hemiplegia and hemiparesis following cerebral infarction affecting left non-dominant side: Secondary | ICD-10-CM | POA: Diagnosis not present

## 2021-02-05 DIAGNOSIS — I484 Atypical atrial flutter: Secondary | ICD-10-CM | POA: Insufficient documentation

## 2021-02-05 DIAGNOSIS — Z953 Presence of xenogenic heart valve: Secondary | ICD-10-CM | POA: Insufficient documentation

## 2021-02-05 DIAGNOSIS — I5032 Chronic diastolic (congestive) heart failure: Secondary | ICD-10-CM

## 2021-02-05 DIAGNOSIS — I38 Endocarditis, valve unspecified: Secondary | ICD-10-CM | POA: Diagnosis not present

## 2021-02-05 DIAGNOSIS — I480 Paroxysmal atrial fibrillation: Secondary | ICD-10-CM

## 2021-02-05 DIAGNOSIS — R7881 Bacteremia: Secondary | ICD-10-CM | POA: Diagnosis not present

## 2021-02-05 DIAGNOSIS — Z993 Dependence on wheelchair: Secondary | ICD-10-CM | POA: Diagnosis not present

## 2021-02-05 DIAGNOSIS — E785 Hyperlipidemia, unspecified: Secondary | ICD-10-CM | POA: Diagnosis not present

## 2021-02-05 DIAGNOSIS — I6601 Occlusion and stenosis of right middle cerebral artery: Secondary | ICD-10-CM | POA: Diagnosis not present

## 2021-02-05 DIAGNOSIS — H538 Other visual disturbances: Secondary | ICD-10-CM | POA: Diagnosis not present

## 2021-02-05 DIAGNOSIS — R293 Abnormal posture: Secondary | ICD-10-CM | POA: Diagnosis not present

## 2021-02-05 DIAGNOSIS — I69812 Visuospatial deficit and spatial neglect following other cerebrovascular disease: Secondary | ICD-10-CM | POA: Diagnosis not present

## 2021-02-05 DIAGNOSIS — Z79899 Other long term (current) drug therapy: Secondary | ICD-10-CM | POA: Diagnosis not present

## 2021-02-05 DIAGNOSIS — T826XXD Infection and inflammatory reaction due to cardiac valve prosthesis, subsequent encounter: Secondary | ICD-10-CM | POA: Diagnosis not present

## 2021-02-05 DIAGNOSIS — B9689 Other specified bacterial agents as the cause of diseases classified elsewhere: Secondary | ICD-10-CM | POA: Insufficient documentation

## 2021-02-05 DIAGNOSIS — G9341 Metabolic encephalopathy: Secondary | ICD-10-CM | POA: Diagnosis not present

## 2021-02-05 DIAGNOSIS — R41841 Cognitive communication deficit: Secondary | ICD-10-CM | POA: Diagnosis not present

## 2021-02-05 DIAGNOSIS — M6389 Disorders of muscle in diseases classified elsewhere, multiple sites: Secondary | ICD-10-CM | POA: Diagnosis not present

## 2021-02-05 DIAGNOSIS — R278 Other lack of coordination: Secondary | ICD-10-CM | POA: Diagnosis not present

## 2021-02-05 DIAGNOSIS — S065X3S Traumatic subdural hemorrhage with loss of consciousness of 1 hour to 5 hours 59 minutes, sequela: Secondary | ICD-10-CM | POA: Diagnosis not present

## 2021-02-05 DIAGNOSIS — I5022 Chronic systolic (congestive) heart failure: Secondary | ICD-10-CM | POA: Diagnosis not present

## 2021-02-05 LAB — CBC
HCT: 31.8 % — ABNORMAL LOW (ref 39.0–52.0)
Hemoglobin: 9.8 g/dL — ABNORMAL LOW (ref 13.0–17.0)
MCH: 26.2 pg (ref 26.0–34.0)
MCHC: 30.8 g/dL (ref 30.0–36.0)
MCV: 85 fL (ref 80.0–100.0)
Platelets: 462 10*3/uL — ABNORMAL HIGH (ref 150–400)
RBC: 3.74 MIL/uL — ABNORMAL LOW (ref 4.22–5.81)
RDW: 15.7 % — ABNORMAL HIGH (ref 11.5–15.5)
WBC: 9.8 10*3/uL (ref 4.0–10.5)
nRBC: 0 % (ref 0.0–0.2)

## 2021-02-05 LAB — SEDIMENTATION RATE: Sed Rate: 77 mm/hr — ABNORMAL HIGH (ref 0–16)

## 2021-02-05 MED ORDER — POTASSIUM CHLORIDE CRYS ER 20 MEQ PO TBCR: 20.0000 meq | EXTENDED_RELEASE_TABLET | Freq: Every day | ORAL | 3 refills | Status: DC

## 2021-02-05 MED ORDER — TORSEMIDE 20 MG PO TABS: 20.0000 mg | ORAL_TABLET | Freq: Every day | ORAL | 3 refills | Status: DC

## 2021-02-05 NOTE — Patient Instructions (Signed)
Following Orders sent back to [facility] with patient:  Labs done today (cbc/sed rate) Refer back to ID, they will call for appointment Follow up with an echocardiogram in Jan (03/31/20 at 10 am)

## 2021-02-07 NOTE — Progress Notes (Signed)
Date:  02/07/2021   ID:  [name] [name], DOB 1940/11/15, MRN [id number]    Provider location: [facility] Type of Visit: Established patient  PCP:  [name] Lima, MD  Cardiologist:  Dr. Aundra [name]   History of Present Illness: [name] [name] is a 80 y.o. male who has a history of rheumatic fever and AI/[name] s/p bioprosthetic AVR and MVR at the [facility] as well as MAZE procedure all in 2006.  He also has an ascending aortic aneurysm.   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  I started him on dronedarone and coumadin with plan for cardioversion after 4 wks of therapeutic anticoagulation if he did not convert back to NSR on his own.  He went back into NSR spontaneously and later stopped coumadin. Atrial flutter recurred in 3/15 and again resolved spontaneously.   In 6/15, patient was admitted with transient dysarthria (resolved in about 10 minutes).  However, he was found to have evidence for CVA by MRI.  Therefore, he was started on coumadin. He was noted to be in atypical atrial flutter again.  Ranolazine was added to dronedarone.     In 8/15, [name] [name] was seen in consultation at the [facility] clinic.  He had atrial flutter ablation at [facility] in 11/15.     He was seen in followup at [facility] in 2/16.  Ranolazine was stopped.  Echo in 2/16 showed EF 55% with stable bioprosthetic mitral and aortic valves.  CTA chest showed stable 5 cm ascending aortic aneurysm.  He went back into atypical atrial flutter in 4/16.  Ranolazine was restarted and he was cardioverted back to NSR.      Cardiolite in 12/18 showed no ischemia. He wore a 30 day monitor in 12/18 with no arrhythmia.     He went into atrial flutter again during a respiratory illness in 2/19.  On 04/24/17, he was cardioverted back to NSR.  He thinks that he went into atrial flutter again later on that day. He was back in atrial flutter at followup  appointment. He was then admitted for Tikosyn loading in 4/19 and was cardioverted back to NSR after the load.    He had atrial fibrillation again in 6/19 with DCCV.    He was seen at the [facility] in 9/19.  Echo showed normal LV and RV systolic function, bioprosthetic MV with mean gradient up to 9 mmHg, normal bioprosthetic AoV.  CTA chest showed aortic root up to 5.3 cm.    He was back in atrial flutter in 12/19, had DCCV back to NSR.  Echo in 12/19 showed EF 60-65%, normal bioprosthetic aortic valve, bioprosthetic mitral valve with mean gradient 4 mmHg and no [name], severe LAE, aortic root 4.8 cm.   He went to the [facility] in 3/20.  Echo at that time showed EF 60-65% with stable bioprosthetic mitral and aortic valves (mean gradient 7 mmHg across MV and 9 mmHg across AoV).  CTA showed 5.0 cm aortic root and ascending aorta, stable.  He returned to the [facility] in 7/20.  He had atrial fibrillation/flutter ablation again and thinks he has been in NSR since that time.  CTA chest showed 5.1 cm ascending thoracic aorta. Seen at [facility] in 7/21.  Echo showed stable bioprosthetic mitral and aortic valves with normal EF.  CTA chest showed 5.0 cm ascending aortic aneurysm.   Last visit  at [facility] 1/22 he was doing well, exercising and euvolemic in NSR.  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 He subsequently developed right MCA territory stroke (as [name] [name] was held) in June 2022, requiring thrombectomy and was discharged to SNF with significant disability.    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 Initial echo showed EF 65-70%, no obvious vegetation. Cards consulted and TEE showed bioprosthetic MV vegetation with trivial [name] and mean gradient 8.  He is not a candidate for mitral valve  replacement.  ID consulted he was started on Rocephin/ampicillin via PICC with a stop date of 01/28/21 then continue on amoxicillin 500 mg bid indefinitely. Hospital course complicated by prolonged encephalopathy.  Neurology consulted and MRI brian showed new left cerebellar vermis CVA, likely septic emboli in the setting of mitral valve endocarditis.  He was discharged back to SNF, weight 187 lbs.  He was readmitted in 5/22 with volume overload and was diuresed. He had transient atrial fibrillation but went back into NSR.   He returns today for followup of diastolic CHF and mitral valve endocarditis.  No fevers/chills.  He is more alert today and able to respond to yes/no questions.  Per his wife, he is able to stand with PT though wheelchair and bed-bound in general.  He denies dyspnea.  He has a foley catheter.   ECG (12/12/11): Atypical atrial flutter versus atrial tachycardia with rate around 100 bpm, QTc normal.  ECG (12/29/11): NSR, 1st degree AV block PR 242 msec.    ECG (12/13): NSR, 1st degree AV block PR 244 msec    ECG (10/14): NSR, 1st degree AV block ECG (3/15): Atypical atrial flutter with rate 87, inferior T wave inversions ECG (6/15): Atypical atrial flutter with rate 77 ECG (8/15): atypical atrial flutter rate 82 ECG (10/15): NSR, 1st degree AV block ECG (12/15): NSR, 1st degree AV block, QTc 410 msec ECG (5/16): NSR, 1st degree AV block, QTc 419 msec ECG (5/17): NSR, 1st degree AV block 270 msec, QTc 433 ECG (2/18): NSR, 1st degree AVB, nonspecific ST-T changes, QTc 435 msec ECG (11/18): NSR, 1st degree AVB, QTc 424 msec ECG (2/19, personally reviewed): atypical atrial flutter at 87, QTc 442 msec ECG (4/19, personally reviewed): NSR, 1st degree AVB, QTc 453 msec ECG (10/19, personally reviewed): NSR, 1st degree AVB, QTc 448 msec ECG (1/20, personally reviewed): atypical atrial flutter in 90s, QTc 472 msec.  ECG (8/20): NSR, 1st degree AVB, QTc 448 msec  ECG (1/21, personally  reviewed): NSR, 1st degree AVB, QTc 434 msec ECG (5/21, personally reviewed): NSR, 1st degree AVB, QTc 447 ECG (1/22, personally reviewed): NSR, 1st degree AVB (306 msec), QTc 446 msec ECG (11/22, personally reviewed): NSR, long 1st degree AVB, QTc 489   Labs (3/14): LDL 64, TSH normal, K 3.9, creatinine 1.0     Labs (6/15): LDL 54, HDL 48, creatinine 0.95   Labs (9/15): LDL 54, HDL 25 Labs (10/15): K 4.5, creatinine 1.3, AST 45, ALT 64 Labs (4/16): K 4.3, creatinine 1.28, HCT 40.7 Labs (5/17): K 4.1, creatinine 1.16, LDL 66, HDL 47 Labs (2/18): LDL 42, HDL 43, K 4.4, creatinine 1.48 Labs (3/18): hgb 13.8 Labs (2/19): LDL 59 Labs (4/19): K 3.9, creatinine 0.97, Mg 2.0 Labs (6/19): K 4.4, creatinine 0.9 Labs (10/19): LDL 84 Labs (12/19): K 4.2, creatinine 1.13, hgb 13.9 Labs (3/20): K 4.1, creatinine 0.98  Labs (6/20): K 4.1, creatinine 0.91, LDL 103  Labs (9/20): K 4, creatinine 0.95 Labs (1/21): K 4, creatinine 0.94, LDL 80 Labs (5/21): K 4.3, creatinine 0.92 Labs (11/22): K 3.2, creatinine 0.6 => 0.88, WBCs 13, ESR 70                                                                                                                              PMH: 1. Atrial fibrillation: s/p MAZE and LAA ligation in 2006.  No documented atrial fibrillation since operation.  2. Atypical atrial flutter: 10/13, 3/15, and again in 6/15.  Recurrent 4/16 with DCCV.  - Event monitor (12/18): no significant arrhythmia.  - Recurrence in 2/19: DCCV to NSR but quickly recurred.  - Admitted in 4/19 for Tikosyn loading and cardioverted to NSR.  - DCCV to NSR in 6/19.  - DCCV to NSR in 12/19. - 7/20 redo atrial flutter/fibrillation ablation.   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   - Echo 5/11 with EF 60-65%, normal bioprosthetic aortic valve, normal bioprosthetic mitral valve, moderate LAE.   - Echo 11/12 at [facility] looked ok per  report.  - Echo (10/13) with EF 55-60%, mild LVH, normal bioprosthetic aortic and mitral valves, moderate LAE, PA systolic pressure 35 mmHg.   - Echo (6/15) with EF 60%, moderate LVH, normal-appearing bioprosthetic MV and AoV, PA systolic pressure 32 mmHg.   - Echo 9/15 [facility]) with EF 55%, mild LVH, PASP 52 mmHg, severe LAE, bioprosthetic mitral valve with no [name] and mean gradient 11 mmHg, moderate TR, bioprosthetic aortic valve with no AI and mean gradient 9 mmHg, dilated ascending aorta to 5 cm.   - Echo (2/16, [facility]) with EF 55%, stable bioprosthetic aortic and mitral valves (AoV mean gradient 7 mmHg, MV mean gradient 4 mmHg), normal RV size and systolic function.  - Echo (12/16, [facility]) with EF 58%, mild RV dilation with low normal systolic function, PA systolic pressure 34 mmHg, bioprosthetic MV and AoV functioning normally.  - Echo (5/18, [facility]) with EF 60-65%, RV normal size and systolic function, moderate LAE, bioprosthetic MV mean gradient 6 with no [name], bioprosthetic aortic valve with mean gradient 11 and no AI, PASP 41 mmHg.  - Echo (2/19): EF 60-65%, moderate LVH, bioprosthetic aortic and mitral valves functioning normally, PASP 23 mmHg, aortic root 4.5 cm.  - Echo (9/19): EF 56%, normal RV size and systolic function, bioprosthetic MV with mean gradient 9 mmHg, trivial [name], bioprosthetic aortic valve with mean gradient 12 mmHg, PASP 51 mmHg.  - Echo (12/19): EF 60-65%, normal bioprosthetic aortic valve, bioprosthetic mitral valve with mean gradient 4 mmHg and no [name], severe LAE, aortic root 4.8 cm.  - Echo (3/20, [facility]): EF 60-65%, normal RV, severe LAE, 5.0 cm ascending aorta, bioprosthetic MV with mean gradient 7 mmHg, bioprosthetic aortic valve with mean gradient 9 mmHg.  -  Echo (7/20, [facility]): EF 55-60%, moderate septal hypertrophy, normal RV size and systolic function, bioprosthetic MV with trace [name] and mean gradient 4,  bioprosthetic aortic valve with mean gradient 14 mmHg, severe LAE.  - Echo (7/21, [facility]): EF 60%, normal RV, s/p bioprosthetic mitral valve with trace [name] and mean gradient 5, s/p bioprosthetic aortic valve with trace AI and mean gradient 15 mmHg, ascending aorta 4.9 cm.  - Echo (10/22): EF 65-70%, normal RV, bioprosthetic AVR mean gradient 16 mmHg appears normal, bioprosthetic MV mean gradient 10, increased from prior but HR also in 100s when study done.   - TEE 10/22 bioprosthetic MV vegetation with trivial [name] and mean gradient 8.  4. Hyperlipidemia 5. L-spine surgery 6. LHC with no significant coronary disease in 2006 pre-op.  - Cardiolite (12/18): No ischemia.  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 - SDH 6/22 with craniotomy followed by MCA CVA due to occluded ICA while off [name] [name] (revascularized).  - 10/22: Small left cerebellar infarct possibly embolic from MV endocarditis. 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  CTA chest 9/15 with dilated sinuses of valsalva to 5.0 cm and ascending aorta 4.9 cm. CTA chest (2/16) with 5.0 cm ascending aorta.  CTA chest (12/16) with 4.9 cm aortic root at SOV, 4.8 cm ascending aorta.  - CT chest (5/18, [facility]): 5.0 cm aortic root, 5.0 cm ascending aorta.  - CTA chest (10/19, [facility]): 5.3 cm aortic root, 5.2 cm ascending aorta.  - CTA chest (3/20, [facility]): 5.0 cm aortic root and ascending aorta - CTA chest (7/20, [facility]): 5.1 cm ascending aorta - CTA chest (7/21, [facility]): 5.1 cm aortic root, 5.0 cm ascending aorta.  9. Chronic diastolic CHF - Echo 35/46 EF 65-70%, normal RV  Current Outpatient Medications  Medication Sig Dispense Refill   amantadine (SYMMETREL) 50 MG/5ML solution Take 5 mLs (50 mg total) by mouth 2 (two) times daily. X 1 week and then once daily x 1 week and STOP     amLODipine (NORVASC) 10 MG tablet Take 1 tablet (10 mg  total) by mouth daily.     amoxicillin (AMOXIL) 400 MG/5ML suspension Take 6.3 mLs (500 mg total) by mouth 2 (two) times daily. Please flavor.  Duration to be determined by infectious disease MD during outpatient follow-up.     apixaban ([name] [name]) 5 MG TABS tablet Take 1 tablet (5 mg total) by mouth 2 (two) times daily. 60 tablet 0   Artificial Saliva (BIOTENE MOISTURIZING MOUTH MT) Use as directed 2 sprays in the mouth or throat in the morning, at noon, and at bedtime.     atorvastatin (LIPITOR) 80 MG tablet Take 1 tablet (80 mg total) by mouth daily.     bisacodyl (DULCOLAX) 10 MG suppository Place 1 suppository (10 mg total) rectally daily as needed for moderate constipation or mild constipation.     clobetasol cream (TEMOVATE) 5.68 % Apply 1 application topically daily as needed (for skin irritation).      docusate sodium (COLACE) 100 MG capsule Take 100 mg by mouth 2 (two) times daily.     dofetilide (TIKOSYN) 125 MCG capsule Take 3 capsules (375 mcg total) by mouth 2 (two) times daily.     famotidine (PEPCID) 20 MG tablet Take 20 mg by mouth at bedtime as needed for indigestion.     feeding supplement, GLUCERNA SHAKE, (GLUCERNA SHAKE) LIQD Take 237 mLs by mouth 2 (two) times daily between meals.  ferrous sulfate 220 (44 Fe) MG/5ML solution Take 240 mg by mouth every Monday, Wednesday, and Friday.     finasteride (PROSCAR) 5 MG tablet Take 1 tablet (5 mg total) by mouth daily. 30 tablet 0   hydrocortisone cream 1 % Apply topically 2 (two) times daily as needed for itching (rash). 30 g 0   ipratropium-albuterol (DUONEB) 0.5-2.5 (3) MG/3ML SOLN Take 3 mLs by nebulization every 6 (six) hours as needed.     lacosamide (VIMPAT) 50 MG TABS tablet Take 1 tablet (50 mg total) by mouth 2 (two) times daily. 60 tablet 3   levalbuterol (XOPENEX) 0.63 MG/3ML nebulizer solution Take 0.63 mg by nebulization every 6 (six) hours as needed for wheezing or shortness of breath.     loratadine (CLARITIN) 10 MG  tablet Take 10 mg by mouth at bedtime.     LORazepam (ATIVAN) 0.5 MG tablet Take 0.5 mg by mouth every 6 (six) hours as needed for anxiety.     magnesium gluconate (MAGONATE) 500 MG tablet Take 1 tablet (500 mg total) by mouth daily. 30 tablet 0   Multiple Vitamin (MULTIVITAMIN WITH MINERALS) TABS tablet Take 1 tablet by mouth daily.     Polyethyl Glycol-Propyl Glycol (SYSTANE) 0.4-0.3 % SOLN Place 2 drops into both eyes daily.     polyethylene glycol (MIRALAX / GLYCOLAX) 17 g packet Take 17 g by mouth daily.     potassium chloride SA (KLOR-CON M) 20 MEQ tablet Take 1 tablet (20 mEq total) by mouth daily. 90 tablet 3   tamsulosin (FLOMAX) 0.4 MG CAPS capsule Take 0.4 mg by mouth daily.     torsemide (DEMADEX) 20 MG tablet Take 1 tablet (20 mg total) by mouth daily. 180 tablet 3   No current facility-administered medications for this encounter.    Allergies:   Naproxen and No healthtouch food allergies   Social History:  The patient  reports that he has never smoked. He has never used smokeless tobacco. He reports that he does not drink alcohol and does not use drugs.   Family History:  The patient's family history includes Cancer in his father; Diabetes in his brother and paternal aunt; Heart attack in his maternal grandfather; Macular degeneration in his mother.   ROS:  Please see the history of present illness.   All other systems are personally reviewed and negative.   Exam:   BP 130/76   Pulse 92   SpO2 97%  General: NAD Neck: No JVD, no thyromegaly or thyroid nodule.  Lungs: Clear to auscultation bilaterally with normal respiratory effort. CV: Nondisplaced PMI.  Heart regular S1/S2, no S3/S4, 2/6 early SEM RUSB.  No peripheral edema.  No carotid bruit.  Normal pedal pulses.  Abdomen: Soft, nontender, no hepatosplenomegaly, no distention.  Skin: Intact without lesions or rashes.  Neurologic: Alert, can answer yes/no questions. Left hemiparesis and left-sided neglect.  Psych:  Normal affect. Extremities: No clubbing or cyanosis.  HEENT: Normal.   Recent Labs: 01/23/2021: ALT 34; B Natriuretic Peptide 223.5 01/24/2021: TSH 1.773 01/27/2021: BUN 15; Creatinine, Ser 1.06; Magnesium 2.4; Potassium 3.5; Sodium 140 02/05/2021: Hemoglobin 9.8; Platelets 462  Personally reviewed   Wt Readings from Last 3 Encounters:  02/03/21 73.9 kg (163 lb)  02/01/21 73.9 kg (163 lb)  01/27/21 60.4 kg (133 lb 2.5 oz)    ASSESSMENT AND PLAN 1. Chronic Diastolic Heart Failure: Most recent echo 10/22 with EF 65-70%, normal RV. Denies dyspnea but confounded by physical deconditioning, minimal communication and left hemiparesis.  He does not look volume overloaded on exam today.  - Continue torsemide 20 mg daily.  BMET today.  2. Neuro: Patient is s/p SDH with craniotomy followed by MCA CVA due to occluded ICA while off [name] [name] (revascularized).  Baseline, he is bed-bound, left hemiparesis.   Also in 10/22 had new small left cerebellar infarct possibly embolic from endocarditis.  He is alert today and able to answer yes/no questions.  - Baseline per his wife.  3. Enterococcal bacteremia: Source uncertain.  No PNA noted on CXR.  Enterococcus did not grow in urine.  Patient had bioprosthetic MV endocarditis by 10/22 TEE, valve function not markedly abnormal (mean gradient elevated at 8 with trivial [name]).  He had a new left cerebellar CVA in 10/22 (small) that may have been embolic from MV vegetation.  With prior sternotomy and minimal mobility, he is not a candidate for redo valve surgery.  He completed 6 week course of ceftriaxone/ampicillin.   - Continue amoxicillin long-term. - Repeat echo in 1/23 to reassess mitral valve.  - Check CBC and ESR today.  4. 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  Echo in 10/22 with EF 65-70%, normal RV, bioprosthetic AVR mean gradient 16 mmHg appears stable, bioprosthetic MV mean gradient 10, increased from prior.  TEE in  10/22 as above showed bioprosthetic MV vegetation with trivial [name] and mean gradient 8.  5. Atrial fibrillation/Flutter: Maze in 2006 + 2 flutter/fib ablations at [facility]. Remains on decreased Tikosyn 375 mcg bid due to previously long QT. He is in NSR today with stable QTc.  - Continue Tikosyn 375 mcg bid.  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  - Continue [name] [name].  6. Hyperlipidemia: He is on atorvastatin.   Loralie Champagne 02/07/2021  [facility] [address] [facility] [place] [address] [phone] (office) [phone] (fax)

## 2021-02-08 DIAGNOSIS — R41841 Cognitive communication deficit: Secondary | ICD-10-CM | POA: Diagnosis not present

## 2021-02-08 DIAGNOSIS — I5022 Chronic systolic (congestive) heart failure: Secondary | ICD-10-CM | POA: Diagnosis not present

## 2021-02-08 DIAGNOSIS — R293 Abnormal posture: Secondary | ICD-10-CM | POA: Diagnosis not present

## 2021-02-08 DIAGNOSIS — D508 Other iron deficiency anemias: Secondary | ICD-10-CM | POA: Diagnosis not present

## 2021-02-08 DIAGNOSIS — I5032 Chronic diastolic (congestive) heart failure: Secondary | ICD-10-CM | POA: Diagnosis not present

## 2021-02-08 DIAGNOSIS — I69391 Dysphagia following cerebral infarction: Secondary | ICD-10-CM | POA: Diagnosis not present

## 2021-02-08 DIAGNOSIS — M6389 Disorders of muscle in diseases classified elsewhere, multiple sites: Secondary | ICD-10-CM | POA: Diagnosis not present

## 2021-02-08 DIAGNOSIS — I6601 Occlusion and stenosis of right middle cerebral artery: Secondary | ICD-10-CM | POA: Diagnosis not present

## 2021-02-08 DIAGNOSIS — I69812 Visuospatial deficit and spatial neglect following other cerebrovascular disease: Secondary | ICD-10-CM | POA: Diagnosis not present

## 2021-02-08 DIAGNOSIS — N401 Enlarged prostate with lower urinary tract symptoms: Secondary | ICD-10-CM | POA: Diagnosis not present

## 2021-02-08 DIAGNOSIS — G9341 Metabolic encephalopathy: Secondary | ICD-10-CM | POA: Diagnosis not present

## 2021-02-08 DIAGNOSIS — R278 Other lack of coordination: Secondary | ICD-10-CM | POA: Diagnosis not present

## 2021-02-08 DIAGNOSIS — H538 Other visual disturbances: Secondary | ICD-10-CM | POA: Diagnosis not present

## 2021-02-08 DIAGNOSIS — I39 Endocarditis and heart valve disorders in diseases classified elsewhere: Secondary | ICD-10-CM | POA: Diagnosis not present

## 2021-02-08 DIAGNOSIS — R2689 Other abnormalities of gait and mobility: Secondary | ICD-10-CM | POA: Diagnosis not present

## 2021-02-08 DIAGNOSIS — I69354 Hemiplegia and hemiparesis following cerebral infarction affecting left non-dominant side: Secondary | ICD-10-CM | POA: Diagnosis not present

## 2021-02-08 DIAGNOSIS — S065X3S Traumatic subdural hemorrhage with loss of consciousness of 1 hour to 5 hours 59 minutes, sequela: Secondary | ICD-10-CM | POA: Diagnosis not present

## 2021-02-08 DIAGNOSIS — R41840 Attention and concentration deficit: Secondary | ICD-10-CM | POA: Diagnosis not present

## 2021-02-08 DIAGNOSIS — N3000 Acute cystitis without hematuria: Secondary | ICD-10-CM | POA: Diagnosis not present

## 2021-02-08 DIAGNOSIS — R3914 Feeling of incomplete bladder emptying: Secondary | ICD-10-CM | POA: Diagnosis not present

## 2021-02-08 LAB — BASIC METABOLIC PANEL
BUN: 23 — AB (ref 4–21)
CO2: 22 (ref 13–22)
Chloride: 107 (ref 99–108)
Creatinine: 0.9 (ref 0.6–1.3)
Glucose: 119
Potassium: 3.7 (ref 3.4–5.3)
Sodium: 150 — AB (ref 137–147)

## 2021-02-08 LAB — HEPATIC FUNCTION PANEL
ALT: 34 (ref 10–40)
AST: 27 (ref 14–40)
Alkaline Phosphatase: 207 — AB (ref 25–125)
Bilirubin, Direct: 0.2 (ref 0.01–0.4)
Bilirubin, Total: 0.4

## 2021-02-08 LAB — COMPREHENSIVE METABOLIC PANEL
Albumin: 3.8 (ref 3.5–5.0)
Calcium: 9.9 (ref 8.7–10.7)

## 2021-02-08 LAB — CBC AND DIFFERENTIAL
HCT: 31 — AB (ref 41–53)
Hemoglobin: 9.4 — AB (ref 13.5–17.5)
Platelets: 406 — AB (ref 150–399)
WBC: 11

## 2021-02-08 LAB — CBC: RBC: 3.62 — AB (ref 3.87–5.11)

## 2021-02-09 ENCOUNTER — Ambulatory Visit (HOSPITAL_COMMUNITY): Admission: RE | Admit: 2021-02-09 | Payer: Medicare Other | Source: Home / Self Care | Admitting: Cardiology

## 2021-02-09 DIAGNOSIS — I69391 Dysphagia following cerebral infarction: Secondary | ICD-10-CM | POA: Diagnosis not present

## 2021-02-09 DIAGNOSIS — R293 Abnormal posture: Secondary | ICD-10-CM | POA: Diagnosis not present

## 2021-02-09 DIAGNOSIS — R278 Other lack of coordination: Secondary | ICD-10-CM | POA: Diagnosis not present

## 2021-02-09 DIAGNOSIS — G9341 Metabolic encephalopathy: Secondary | ICD-10-CM | POA: Diagnosis not present

## 2021-02-09 DIAGNOSIS — R41841 Cognitive communication deficit: Secondary | ICD-10-CM | POA: Diagnosis not present

## 2021-02-09 DIAGNOSIS — I5032 Chronic diastolic (congestive) heart failure: Secondary | ICD-10-CM | POA: Diagnosis not present

## 2021-02-09 DIAGNOSIS — I10 Essential (primary) hypertension: Secondary | ICD-10-CM | POA: Diagnosis not present

## 2021-02-09 DIAGNOSIS — H538 Other visual disturbances: Secondary | ICD-10-CM | POA: Diagnosis not present

## 2021-02-09 DIAGNOSIS — R2689 Other abnormalities of gait and mobility: Secondary | ICD-10-CM | POA: Diagnosis not present

## 2021-02-09 DIAGNOSIS — M6389 Disorders of muscle in diseases classified elsewhere, multiple sites: Secondary | ICD-10-CM | POA: Diagnosis not present

## 2021-02-09 DIAGNOSIS — S065X3S Traumatic subdural hemorrhage with loss of consciousness of 1 hour to 5 hours 59 minutes, sequela: Secondary | ICD-10-CM | POA: Diagnosis not present

## 2021-02-09 DIAGNOSIS — I69812 Visuospatial deficit and spatial neglect following other cerebrovascular disease: Secondary | ICD-10-CM | POA: Diagnosis not present

## 2021-02-09 DIAGNOSIS — R41840 Attention and concentration deficit: Secondary | ICD-10-CM | POA: Diagnosis not present

## 2021-02-09 DIAGNOSIS — I6601 Occlusion and stenosis of right middle cerebral artery: Secondary | ICD-10-CM | POA: Diagnosis not present

## 2021-02-09 DIAGNOSIS — I69354 Hemiplegia and hemiparesis following cerebral infarction affecting left non-dominant side: Secondary | ICD-10-CM | POA: Diagnosis not present

## 2021-02-09 DIAGNOSIS — I5022 Chronic systolic (congestive) heart failure: Secondary | ICD-10-CM | POA: Diagnosis not present

## 2021-02-09 LAB — BASIC METABOLIC PANEL
BUN: 24 — AB (ref 4–21)
CO2: 21 (ref 13–22)
Chloride: 104 (ref 99–108)
Creatinine: 0.9 (ref 0.6–1.3)
Glucose: 136
Potassium: 3.3 — AB (ref 3.4–5.3)
Sodium: 147 (ref 137–147)

## 2021-02-09 LAB — COMPREHENSIVE METABOLIC PANEL: Calcium: 10 (ref 8.7–10.7)

## 2021-02-09 SURGERY — CARDIOVERSION
Anesthesia: General

## 2021-02-10 ENCOUNTER — Encounter: Payer: Medicare Other | Admitting: Physical Medicine & Rehabilitation

## 2021-02-10 ENCOUNTER — Encounter (HOSPITAL_COMMUNITY): Payer: Medicare Other

## 2021-02-10 DIAGNOSIS — I69354 Hemiplegia and hemiparesis following cerebral infarction affecting left non-dominant side: Secondary | ICD-10-CM | POA: Diagnosis not present

## 2021-02-10 DIAGNOSIS — I5032 Chronic diastolic (congestive) heart failure: Secondary | ICD-10-CM | POA: Diagnosis not present

## 2021-02-10 DIAGNOSIS — H538 Other visual disturbances: Secondary | ICD-10-CM | POA: Diagnosis not present

## 2021-02-10 DIAGNOSIS — I69391 Dysphagia following cerebral infarction: Secondary | ICD-10-CM | POA: Diagnosis not present

## 2021-02-10 DIAGNOSIS — M6389 Disorders of muscle in diseases classified elsewhere, multiple sites: Secondary | ICD-10-CM | POA: Diagnosis not present

## 2021-02-10 DIAGNOSIS — S065X3S Traumatic subdural hemorrhage with loss of consciousness of 1 hour to 5 hours 59 minutes, sequela: Secondary | ICD-10-CM | POA: Diagnosis not present

## 2021-02-10 DIAGNOSIS — G9341 Metabolic encephalopathy: Secondary | ICD-10-CM | POA: Diagnosis not present

## 2021-02-10 DIAGNOSIS — I6601 Occlusion and stenosis of right middle cerebral artery: Secondary | ICD-10-CM | POA: Diagnosis not present

## 2021-02-10 DIAGNOSIS — R2689 Other abnormalities of gait and mobility: Secondary | ICD-10-CM | POA: Diagnosis not present

## 2021-02-10 DIAGNOSIS — R278 Other lack of coordination: Secondary | ICD-10-CM | POA: Diagnosis not present

## 2021-02-10 DIAGNOSIS — R41840 Attention and concentration deficit: Secondary | ICD-10-CM | POA: Diagnosis not present

## 2021-02-10 DIAGNOSIS — R41841 Cognitive communication deficit: Secondary | ICD-10-CM | POA: Diagnosis not present

## 2021-02-10 DIAGNOSIS — I5022 Chronic systolic (congestive) heart failure: Secondary | ICD-10-CM | POA: Diagnosis not present

## 2021-02-10 DIAGNOSIS — R293 Abnormal posture: Secondary | ICD-10-CM | POA: Diagnosis not present

## 2021-02-10 DIAGNOSIS — I69812 Visuospatial deficit and spatial neglect following other cerebrovascular disease: Secondary | ICD-10-CM | POA: Diagnosis not present

## 2021-02-11 ENCOUNTER — Non-Acute Institutional Stay (SKILLED_NURSING_FACILITY): Payer: Medicare Other | Admitting: Adult Health

## 2021-02-11 ENCOUNTER — Encounter: Payer: Self-pay | Admitting: Adult Health

## 2021-02-11 DIAGNOSIS — I5032 Chronic diastolic (congestive) heart failure: Secondary | ICD-10-CM | POA: Diagnosis not present

## 2021-02-11 DIAGNOSIS — T826XXD Infection and inflammatory reaction due to cardiac valve prosthesis, subsequent encounter: Secondary | ICD-10-CM | POA: Diagnosis not present

## 2021-02-11 DIAGNOSIS — S069X3S Unspecified intracranial injury with loss of consciousness of 1 hour to 5 hours 59 minutes, sequela: Secondary | ICD-10-CM

## 2021-02-11 DIAGNOSIS — N401 Enlarged prostate with lower urinary tract symptoms: Secondary | ICD-10-CM

## 2021-02-11 DIAGNOSIS — R338 Other retention of urine: Secondary | ICD-10-CM

## 2021-02-11 DIAGNOSIS — E870 Hyperosmolality and hypernatremia: Secondary | ICD-10-CM | POA: Diagnosis not present

## 2021-02-11 DIAGNOSIS — I6601 Occlusion and stenosis of right middle cerebral artery: Secondary | ICD-10-CM | POA: Diagnosis not present

## 2021-02-11 DIAGNOSIS — I69354 Hemiplegia and hemiparesis following cerebral infarction affecting left non-dominant side: Secondary | ICD-10-CM

## 2021-02-11 DIAGNOSIS — R5383 Other fatigue: Secondary | ICD-10-CM

## 2021-02-11 DIAGNOSIS — R41841 Cognitive communication deficit: Secondary | ICD-10-CM | POA: Diagnosis not present

## 2021-02-11 DIAGNOSIS — I69391 Dysphagia following cerebral infarction: Secondary | ICD-10-CM | POA: Diagnosis not present

## 2021-02-11 DIAGNOSIS — I38 Endocarditis, valve unspecified: Secondary | ICD-10-CM | POA: Diagnosis not present

## 2021-02-11 LAB — CBC AND DIFFERENTIAL
HCT: 30 — AB (ref 41–53)
Hemoglobin: 9.1 — AB (ref 13.5–17.5)
Platelets: 335 (ref 150–399)
WBC: 10.7

## 2021-02-11 LAB — BASIC METABOLIC PANEL
BUN: 19 (ref 4–21)
CO2: 25 — AB (ref 13–22)
Chloride: 111 — AB (ref 99–108)
Creatinine: 0.7 (ref 0.6–1.3)
Glucose: 107
Potassium: 3.4 (ref 3.4–5.3)
Sodium: 146 (ref 137–147)

## 2021-02-11 LAB — POCT ERYTHROCYTE SEDIMENTATION RATE, NON-AUTOMATED: Sed Rate: 49

## 2021-02-11 LAB — CBC: RBC: 3.54 — AB (ref 3.87–5.11)

## 2021-02-11 LAB — COMPREHENSIVE METABOLIC PANEL: Calcium: 9.4 (ref 8.7–10.7)

## 2021-02-11 NOTE — Progress Notes (Signed)
Location:  Occupational psychologist of Service:  SNF (31) Provider:   Cindi Carbon, [place] [phone]   [name], MD  Patient Care Team: [name], MD as PCP - General (Internal Medicine) Larey Dresser, MD as PCP - Cardiology (Cardiology) [name], MD as Consulting Physician (Ophthalmology) [name], MD (Internal Medicine)  Extended Emergency Contact Information Primary Emergency Contact: [name] [name] Address: [place], [place] [address] [name] [name] of [place] Phone: [phone] Relation: Spouse Secondary Emergency Contact: [name] [name], [place] [address] [name] [name] of [place] Phone: [phone] Relation: Son  Code Status:  DNR Goals of care: Advanced Directive information Advanced Directives 02/01/2021  Does Patient Have a Medical Advance Directive? Yes  Type of Paramedic of [place];Living will;Out of facility DNR (pink MOST or yellow form)  Does patient want to make changes to medical advance directive? No - Patient declined  Copy of [place] in Chart? Yes - validated most recent copy scanned in chart (See row information)  Would patient like information on creating a medical advance directive? -  Pre-existing out of facility DNR order (yellow form or pink MOST form) -     Chief Complaint  Patient presents with   Acute Visit    lethargy    HPI:  Pt is a 80 y.o. male seen today for an acute visit for lethargy.  PMH includes syncope with fall in May of 2022, TBI with SDH with craniotomy, acute right MCA s/p thrombectomy, mitral and aortic valve replacement, DM II, and afib.  Recently hospitalized 12/14/20-01/04/21 for acute metabolic encephalopathy due to hypernatremia and enterococcal bacteremia with prosthetic valve endocarditis. Blood cx grew enterococcus faecalis and urine culture grew staph aureus. Repeat  Blood cx 10/17 negative. He was treated with Rocephin and ampicillin with a stop date of 01/28/21 and then he will take amoxicillin 500 mg bid indefinitely. He also had another CVA due to due to septic emboli during the hospitalization.  More recently 01/23/21-01/27/21 he was in the hospital with CHF and weight gain. He was diuresed from 184 prior to admit down to 163 lbs. He was discharged on torsemide 20 mg daily . As of 02/11/2021 he is 151 lbs. The staff has been noticing that he is sleeping more and talking less. Labs were drawn and his NA was 147 on 12/6, BUN 23.9 Cr 0.92.  Torsemide was held on 12/6.   In review of the records he is drinking 720-840 cc per day. Output 750 in the last 24 hrs but sometimes he has a wet brief as well.  Recently seen by urology for urinary retention. The recommended CIC three times daily and to discontinue the flomax and start methenamine.  Seen by neurology and tapered off amantadine.  His wife is sick with covid and not able to visit and she is his primary support. Pt has not symptoms of covid and is negative x 2.  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    Other and unspecified hyperlipidemia    Pre-diabetes    Previous back surgery 1978, jan 2007   Psychosexual dysfunction with inhibited sexual excitement    SOB (shortness of breath)    "with heavy exercise"   Stroke ([name]) 08/2013   "I WAS IN AFIB AND THREW A CLOT, THE EFFECTS WERE TRANSITORY AND DIDNT LAST BUT FOR 30  MINUTES"    Thoracic aortic aneurysm    Past Surgical History:  Procedure Laterality Date   AORTIC AND MITRAL VALVE REPLACEMENT     09/2004   CARDIOVERSION     3 times from 2004-2006   CARDIOVERSION N/A 09/26/2013   Procedure: CARDIOVERSION;  Surgeon: Larey Dresser, MD;  Location: [place];  Service: Cardiovascular;  Laterality: N/A;   CARDIOVERSION N/A 06/19/2014   Procedure: CARDIOVERSION;  Surgeon: Jerline Pain, MD;  Location: [place];  Service: Cardiovascular;  Laterality: N/A;   CARDIOVERSION N/A 04/24/2017   Procedure: CARDIOVERSION;  Surgeon: Larey Dresser, MD;  Location: [place];  Service: Cardiovascular;  Laterality: N/A;   CARDIOVERSION N/A 06/08/2017   Procedure: CARDIOVERSION;  Surgeon: Pixie Casino, MD;  Location: [facility] ENDOSCOPY;  Service: Cardiovascular;  Laterality: N/A;   CARDIOVERSION N/A 08/24/2017   Procedure: CARDIOVERSION;  Surgeon: Skeet Latch, MD;  Location: [facility] ENDOSCOPY;  Service: Cardiovascular;  Laterality: N/A;   CARDIOVERSION N/A 02/14/2018   Procedure: CARDIOVERSION;  Surgeon: Larey Dresser, MD;  Location: [facility] ENDOSCOPY;  Service: Cardiovascular;  Laterality: N/A;   COLONOSCOPY     CRANIOTOMY N/A 07/28/2020   Procedure: CRANIOTOMY FOR EVACUATION OF SUBDURAL HEMATOMA;  Surgeon: Eustace Durnell, MD;  Location: [place];  Service: Neurosurgery;  Laterality: N/A;   IR ANGIO VERTEBRAL SEL SUBCLAVIAN INNOMINATE UNI R MOD SED  08/07/2020   IR CT HEAD LTD  08/07/2020   IR PERCUTANEOUS ART THROMBECTOMY/INFUSION INTRACRANIAL INC DIAG ANGIO  08/07/2020   laminectomies     10/1975 and in 03/2005   RADIOLOGY WITH ANESTHESIA N/A 08/07/2020   Procedure: IR WITH ANESTHESIA;  Surgeon: Luanne Bras, MD;  Location: [place];  Service: Radiology;  Laterality: N/A;   TEE WITHOUT CARDIOVERSION N/A 12/18/2020   Procedure: TRANSESOPHAGEAL ECHOCARDIOGRAM (TEE);  Surgeon: Larey Dresser, MD;  Location: [facility] ENDOSCOPY;  Service: Cardiovascular;  Laterality: N/A;    [place] Right 04/03/2018   Procedure: TOTAL HIP ARTHROPLASTY ANTERIOR APPROACH;  Surgeon: Paralee Cancel, MD;  Location: [facility];  Service: Orthopedics;  Laterality: Right;  70 mins    Allergies  Allergen Reactions   Naproxen Hives   No [facility] Food Allergies Other (See Comments)    Scallops - distress, nausea and vomitting    Outpatient Encounter Medications as of 02/11/2021  Medication Sig   diphenhydramine-acetaminophen (TYLENOL PM) 25-500 MG TABS tablet Take 1 tablet by mouth at bedtime as needed.   methenamine (HIPREX) 1 g tablet Take 1 g by mouth 2 (two) times daily with a meal.   senna-docusate (SENOKOT-S) 8.6-50 MG tablet Take 1 tablet by mouth 2 (two) times daily.   amLODipine (NORVASC) 10 MG tablet Take 1 tablet (10 mg total) by mouth daily.   amoxicillin (AMOXIL) 400 MG/5ML suspension Take 6.3 mLs (500 mg total) by  mouth 2 (two) times daily. Please flavor.  Duration to be determined by infectious disease MD during outpatient follow-up.   apixaban (ELIQUIS) 5 MG TABS tablet Take 1 tablet (5 mg total) by mouth 2 (two) times daily.   Artificial Saliva (BIOTENE MOISTURIZING MOUTH MT) Use as directed 2 sprays in the mouth or throat in the morning, at noon, and at bedtime.   atorvastatin (LIPITOR) 80 MG tablet Take 1 tablet (80 mg total) by mouth daily.   bisacodyl (DULCOLAX) 10 MG suppository Place 1 suppository (10 mg total) rectally daily as needed for moderate constipation or mild constipation.   clobetasol cream (TEMOVATE) 1.61 % Apply 1 application topically daily as needed (for skin irritation).    dofetilide (TIKOSYN) 125 MCG capsule Take 3 capsules (375 mcg total) by mouth 2 (two) times daily.   famotidine (PEPCID) 20 MG tablet Take 20 mg by mouth at bedtime as needed for indigestion.   feeding supplement, GLUCERNA SHAKE, (GLUCERNA SHAKE) LIQD Take 237 mLs by mouth 2 (two) times daily between meals.   ferrous sulfate 220 (44 Fe) MG/5ML  solution Take 240 mg by mouth every Monday, Wednesday, and Friday.   finasteride (PROSCAR) 5 MG tablet Take 1 tablet (5 mg total) by mouth daily.   hydrocortisone cream 1 % Apply topically 2 (two) times daily as needed for itching (rash).   lacosamide (VIMPAT) 50 MG TABS tablet Take 1 tablet (50 mg total) by mouth 2 (two) times daily.   levalbuterol (XOPENEX) 0.63 MG/3ML nebulizer solution Take 0.63 mg by nebulization every 6 (six) hours as needed for wheezing or shortness of breath.   LORazepam (ATIVAN) 0.5 MG tablet Take 0.5 mg by mouth every 6 (six) hours as needed for anxiety.   magnesium gluconate (MAGONATE) 500 MG tablet Take 1 tablet (500 mg total) by mouth daily.   Multiple Vitamin (MULTIVITAMIN WITH MINERALS) TABS tablet Take 1 tablet by mouth daily.   Polyethyl Glycol-Propyl Glycol (SYSTANE) 0.4-0.3 % SOLN Place 2 drops into both eyes daily.   polyethylene glycol (MIRALAX / GLYCOLAX) 17 g packet Take 17 g by mouth daily.   potassium chloride SA (KLOR-CON M) 20 MEQ tablet Take 1 tablet (20 mEq total) by mouth daily.   [DISCONTINUED] amantadine (SYMMETREL) 50 MG/5ML solution Take 5 mLs (50 mg total) by mouth 2 (two) times daily. X 1 week and then once daily x 1 week and STOP   [DISCONTINUED] docusate sodium (COLACE) 100 MG capsule Take 100 mg by mouth 2 (two) times daily.   [DISCONTINUED] ipratropium-albuterol (DUONEB) 0.5-2.5 (3) MG/3ML SOLN Take 3 mLs by nebulization every 6 (six) hours as needed.   [DISCONTINUED] loratadine (CLARITIN) 10 MG tablet Take 10 mg by mouth at bedtime.   [DISCONTINUED] tamsulosin (FLOMAX) 0.4 MG CAPS capsule Take 0.4 mg by mouth daily.   [DISCONTINUED] torsemide (DEMADEX) 20 MG tablet Take 1 tablet (20 mg total) by mouth daily.   No facility-administered encounter medications on file as of 02/11/2021.    Review of Systems  Unable to perform ROS: Mental status change   Immunization History  Administered Date(s) Administered   Fluad Quad(high Dose 65+)  12/06/2018, 12/09/2020   Influenza Whole 12/25/2008, 01/05/2010, 12/30/2011   Influenza, High Dose Seasonal PF 12/14/2016   Influenza,inj,Quad PF,6+ Mos 12/19/2013   Influenza,inj,quad, With Preservative 11/22/2017   Influenza-Unspecified 12/14/2015, 12/24/2019   Moderna Covid-19 Vaccine Bivalent Booster 56yr & up 01/12/2021   Moderna SARS-COV2 Booster Vaccination 01/02/2020, 06/26/2020   Moderna Sars-Covid-2 Vaccination 04/08/2019, 05/05/2019   Pneumococcal  Conjugate-13 12/19/2013   Pneumococcal Polysaccharide-23 09/24/2007, 05/22/2017   Td 12/25/2008   Tdap 03/22/2018   Zoster Recombinat (Shingrix) 07/16/2019, 10/28/2019   Zoster, Live 09/24/2007   Pertinent  Health Maintenance Due  Topic Date Due   FOOT EXAM  Never done   URINE MICROALBUMIN  Never done   OPHTHALMOLOGY EXAM  04/07/2021 (Originally 10/04/1950)   HEMOGLOBIN A1C  06/17/2021   INFLUENZA VACCINE  Completed   COLONOSCOPY (Pts 45-90yr Insurance coverage will need to be confirmed)  Discontinued   Fall Risk 01/25/2021 01/26/2021 01/26/2021 01/26/2021 01/27/2021  Falls in the past year? - - - - -  Was there an injury with Fall? - - - - -  Fall Risk Category Calculator - - - - -  Fall Risk Category - - - - -  Patient Fall Risk Level _0   Patient at Risk for Falls Due to - - - - -  Patient at Risk for Falls Due to - - - - -  Fall risk Follow up - - - - -   Functional Status Survey:    Vitals:   02/11/21 1409  BP: 126/74  Pulse: 80  Resp: 20  Temp: 98 F (36.7 C)  SpO2: 98%  Weight: 151 lb 3.2 oz (68.6 kg)   Body mass index is 23.68 kg/m. Wt Readings from Last 3 Encounters:  02/11/21 151 lb 3.2 oz (68.6 kg)  02/03/21 163 lb (73.9 kg)  02/01/21 163 lb (73.9 kg)    Physical Exam Vitals and nursing note reviewed.  Constitutional:      General: He is not in acute distress.    Appearance: He is not diaphoretic.  HENT:     Head:  Normocephalic and atraumatic.     Nose: Nose normal. No congestion.     Mouth/Throat:     Mouth: Mucous membranes are moist.     Pharynx: Oropharynx is clear.  Eyes:     Conjunctiva/sclera: Conjunctivae normal.     Pupils: Pupils are equal, round, and reactive to light.  Neck:     Thyroid: No thyromegaly.     Vascular: No JVD.     Trachea: No tracheal deviation.  Cardiovascular:     Rate and Rhythm: Normal rate.     Heart sounds: No murmur heard.    Comments: Regular with occas skipped beat Pulmonary:     Effort: Pulmonary effort is normal. No respiratory distress.     Breath sounds: Normal breath sounds. No wheezing.  Abdominal:     General: Bowel sounds are normal. There is no distension.     Palpations: Abdomen is soft.     Tenderness: There is no abdominal tenderness.  Musculoskeletal:     Cervical back: Normal range of motion and neck supple.     Right lower leg: No edema.     Left lower leg: No edema.  Lymphadenopathy:     Cervical: No cervical adenopathy.  Skin:    General: Skin is warm and dry.  Neurological:     Mental Status: He is alert.     Comments: Alert. Intermittently able to f/c and answer questions. Left facial droop. Left sided hemiplegia.   Psychiatric:        Mood and Affect: Mood normal.    Labs reviewed: Recent Labs    12/18/20 1630 12/19/20 0610 12/19/20 1632 12/20/20 0153 01/25/21 0627011/21/22 0350011/22/22 0435 01/27/21 0538  NA  --  139  --    < > 143  --  143 140  K  --  3.5  --    < > 3.6  --  3.8 3.5  CL  --  108  --    < > 107  --  104 107  CO2  --  23  --    < > 27  --  26 25  GLUCOSE  --  186*  --    < > 93  --  105* 137*  BUN  --  16  --    < > 11  --  12 15  CREATININE  --  0.76  --    < > 0.94  --  0.95 1.06  CALCIUM  --  7.7*  --    < > 8.2*  --  8.7* 8.7*  MG 1.9 2.0 1.9   < >  --  2.4 2.2 2.4  PHOS 3.1 2.7 2.4*  --   --   --   --   --    < > = values in this interval not displayed.   Recent Labs    01/03/21 0221  01/08/21 0000 01/20/21 1631 01/23/21 1355  AST 32 26 27 37  ALT 63* 42* 35 34  ALKPHOS 94 120 115 107  BILITOT 0.4  --  0.6 0.7  PROT 6.3*  --  6.9 6.9  ALBUMIN 1.9* 2.8* 2.5* 2.4*   Recent Labs    08/16/20 0427 08/17/20 0340 12/14/20 1245 12/14/20 1504 12/16/20 0130 12/18/20 0221 01/24/21 0154 01/25/21 0623 01/26/21 0435 02/05/21 1111  WBC 7.9   < > 12.0*   < > 10.7*   < > 10.0  --  10.4 9.8  NEUTROABS 5.7  --  9.3*  --  7.7  --   --   --   --   --   HGB 9.0*   < > 11.7*   < > 9.9*   < > 7.3* 7.4* 7.9* 9.8*  HCT 28.2*   < > 36.8*   < > 30.7*   < > 23.3* 24.4* 25.9* 31.8*  MCV 98.6   < > 90.0   < > 88.0   < > 88.3  --  88.7 85.0  PLT 177   < > 119*   < > 106*   < > 395  --  417* 462*   < > = values in this interval not displayed.   Lab Results  Component Value Date   TSH 1.773 01/24/2021   Lab Results  Component Value Date   HGBA1C 7.3 (H) 12/17/2020   Lab Results  Component Value Date   CHOL 119 08/08/2020   HDL 42 08/08/2020   LDLCALC 56 08/08/2020   TRIG 105 08/08/2020   CHOLHDL 2.8 08/08/2020    Significant Diagnostic Results in last 30 days:  CT Head Wo Contrast  Result Date: 01/23/2021 CLINICAL DATA:  Stroke follow-up. EXAM: CT HEAD WITHOUT CONTRAST TECHNIQUE: Contiguous axial images were obtained from the base of the skull through the vertex without intravenous contrast. COMPARISON:  December 14, 2020 FINDINGS: Brain: A right MCA stroke with encephalomalacia is stable. Ventricular dilatation remains. No acute subdural, epidural, or subarachnoid hemorrhage identified. Cerebellum, brainstem, and basal cisterns are normal. White matter changes noted. No acute ischemia identified. A small posterior left parietal infarct, unchanged. No mass effect or midline shift. Vascular: Calcified atherosclerosis in the intracranial carotids. Skull: Previous right craniotomy. Sinuses/Orbits: No acute finding.  Other: No other abnormalities. IMPRESSION: Chronic right MCA stroke.  Chronic left posterior parietal stroke. No acute intracranial abnormality. Electronically Signed   By: Dorise Bullion III M.D.   On: 01/23/2021 15:46   MR BRAIN WO CONTRAST  Result Date: 01/24/2021 CLINICAL DATA:  Delirium EXAM: MRI HEAD WITHOUT CONTRAST TECHNIQUE: Multiplanar, multiecho pulse sequences of the brain and surrounding structures were obtained without intravenous contrast. COMPARISON:  CT head dated 1 day prior, brain MRI 12/18/2020 FINDINGS: Brain: There is significant artifact obscuring large portions of the axial DWI sequence including the entire posterior fossa. There is no evidence of acute ischemia on the coronal DWI sequence. There is no evidence of acute intracranial hemorrhage or acute extra-axial fluid collection. There is marked global parenchymal volume loss with enlargement of the ventricular system. There is an unchanged large right MCA territorial infarct with associated encephalomalacia and ex vacuo dilatation of the right lateral ventricle. An additional remote infarct in the left parietal lobe is unchanged. There is extensive FLAIR signal abnormality throughout the subcortical and periventricular white matter likely reflecting sequela of advanced chronic white matter microangiopathy. There is a thin subdural collection overlying the right cerebral hemisphere underlying a craniotomy site which may reflect dural thickening or a tiny chronic subdural hematoma. There is SWI signal dropout in the right frontal lobe consistent with interval evolution of the previously seen intraparenchymal hematoma on the MRI of 12/18/2020. There are a few additional scattered punctate chronic microhemorrhages, nonspecific. There is no solid mass lesion.  There is no midline shift. Vascular: Normal flow voids. Skull and upper cervical spine: Postsurgical changes reflecting right frontal parietal craniotomy are again seen. There is no suspicious marrow signal abnormality. Sinuses/Orbits: The paranasal  sinuses are clear. Bilateral lens implants are in place. The globes and orbits are otherwise unremarkable. Other: None. IMPRESSION: 1. No evidence of acute intracranial pathology. 2. Interval expected evolution of the small intraparenchymal hematoma in the right frontal lobe since 12/18/2020. 3. Unchanged remote MCA territorial infarct and small remote infarct in the left parietal lobe. 4. 3 mm thick extra-axial signal abnormality overlying the right cerebral hemisphere deep to the craniotomy site may reflect a small chronic subdural hematoma or dural thickening. The appearance is not significantly changed since 12/18/2020. 5. Unchanged advanced global parenchymal volume loss and chronic white matter microangiopathy. Electronically Signed   By: Valetta Mole M.D.   On: 01/24/2021 16:53   DG Chest Port 1 View  Result Date: 01/23/2021 CLINICAL DATA:  Weight gain.  Shortness of breath. EXAM: PORTABLE CHEST 1 VIEW COMPARISON:  December 31, 2020 FINDINGS: Stable cardiomegaly. Stable hila and mediastinum. A right PICC line terminating central SVC. No pneumothorax. Bilateral pulmonary opacities, right greater than left. No other acute abnormalities. IMPRESSION: Right greater than left pulmonary opacities may represent asymmetric edema, especially given the cardiomegaly and history of weight gain. A multifocal infectious process is considered less likely. Recommend clinical correlation and follow-up to complete resolution. Electronically Signed   By: Dorise Bullion III M.D.   On: 01/23/2021 15:40   EEG adult  Result Date: 01/24/2021 Lora Havens, MD     01/24/2021  6:24 PM Patient Name: [name] [name] MRN: [id number] Epilepsy Attending: Lora Havens Referring Physician/Provider: Dr Derrick Ravel Date: 01/24/2021 Duration: 23.17 mins Patient history: 80yo m with h/o right MCA stroke, epilepsy who presented with ams. EEG to evaluate for seizure Level of alertness: Awake AEDs during EEG study: LCM  Technical aspects: This EEG study was done with scalp  electrodes positioned according to the 10-20 International system of electrode placement. Electrical activity was acquired at a sampling rate of 500Hz and reviewed with a high frequency filter of 70Hz and a low frequency filter of 1Hz. EEG data were recorded continuously and digitally stored. Description: The posterior dominant rhythm consists of 8-9 Hz activity of moderate voltage (25-35 uV) seen predominantly in posterior head regions, symmetric and reactive to eye opening and eye closing. EEG showed continuous low amplitude 3 to 6 Hz theta-delta slowing in right frontotemporal region. Patient was noted to have right upper extremity tremor like movement  intermittently throughout the study. Concomitant eeg before, during and after the events didn't show any eeg change to suggest seizure Hyperventilation and photic stimulation were not performed.   ABNORMALITY - Continuous slow, right frontotemporal region. IMPRESSION: This study is suggestive of cortical dysfunction arising from  right frontotemporal region, nonspecific etiology but likely secondary to underlying stroke. No seizures or epileptiform discharges were seen throughout the recording. Patient was noted to have right upper extremity tremor like movement  intermittently throughout the study without concomitant eeg change. These episodes were most likely NOT epileptic. Priyanka Barbra Sarks    Assessment/Plan 1. Lethargy Likely due to poor intake and diuresis with elevated NA along with torsemide. Also with TBI, stroke.  2. Hypernatremia See #1 Improved to 146 02/11/2021 with torsemide on hold.  Will repeat Monday 12/12  3. Chronic diastolic CHF (congestive heart failure) (HCC) Off torsemide due to significant weight loss and elevated sodium  Still of Kdur as he previously had low K and a goal of 4.0 on Tikosyn Daily weights and report if 3 lb gain in 1 day or 5 lb in 1 week, may give torsemide  20 mg x1 prn  4. Prosthetic valve endocarditis, subsequent encounter Continues on amoxicillin indefinitely. No fever. WBC normalized. ESR con  5. 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   6. 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 Urinary retention due to benign prostatic hyperplasia CIC three times daily per urology On proscar and UTI prevention.     Family/ staff Communication: nurse  Labs/tests ordered:  BMP

## 2021-02-12 DIAGNOSIS — I5032 Chronic diastolic (congestive) heart failure: Secondary | ICD-10-CM | POA: Diagnosis not present

## 2021-02-12 DIAGNOSIS — R2689 Other abnormalities of gait and mobility: Secondary | ICD-10-CM | POA: Diagnosis not present

## 2021-02-12 DIAGNOSIS — R278 Other lack of coordination: Secondary | ICD-10-CM | POA: Diagnosis not present

## 2021-02-12 DIAGNOSIS — R41840 Attention and concentration deficit: Secondary | ICD-10-CM | POA: Diagnosis not present

## 2021-02-12 DIAGNOSIS — H538 Other visual disturbances: Secondary | ICD-10-CM | POA: Diagnosis not present

## 2021-02-12 DIAGNOSIS — M6389 Disorders of muscle in diseases classified elsewhere, multiple sites: Secondary | ICD-10-CM | POA: Diagnosis not present

## 2021-02-12 DIAGNOSIS — I69391 Dysphagia following cerebral infarction: Secondary | ICD-10-CM | POA: Diagnosis not present

## 2021-02-12 DIAGNOSIS — R41841 Cognitive communication deficit: Secondary | ICD-10-CM | POA: Diagnosis not present

## 2021-02-12 DIAGNOSIS — G9341 Metabolic encephalopathy: Secondary | ICD-10-CM | POA: Diagnosis not present

## 2021-02-12 DIAGNOSIS — I5022 Chronic systolic (congestive) heart failure: Secondary | ICD-10-CM | POA: Diagnosis not present

## 2021-02-12 DIAGNOSIS — R293 Abnormal posture: Secondary | ICD-10-CM | POA: Diagnosis not present

## 2021-02-12 DIAGNOSIS — I69812 Visuospatial deficit and spatial neglect following other cerebrovascular disease: Secondary | ICD-10-CM | POA: Diagnosis not present

## 2021-02-12 DIAGNOSIS — I6601 Occlusion and stenosis of right middle cerebral artery: Secondary | ICD-10-CM | POA: Diagnosis not present

## 2021-02-12 DIAGNOSIS — S065X3S Traumatic subdural hemorrhage with loss of consciousness of 1 hour to 5 hours 59 minutes, sequela: Secondary | ICD-10-CM | POA: Diagnosis not present

## 2021-02-12 DIAGNOSIS — I69354 Hemiplegia and hemiparesis following cerebral infarction affecting left non-dominant side: Secondary | ICD-10-CM | POA: Diagnosis not present

## 2021-02-12 LAB — MAGNESIUM: Magnesium: 2.1

## 2021-02-15 ENCOUNTER — Non-Acute Institutional Stay (SKILLED_NURSING_FACILITY): Payer: Medicare Other | Admitting: Internal Medicine

## 2021-02-15 DIAGNOSIS — I482 Chronic atrial fibrillation, unspecified: Secondary | ICD-10-CM

## 2021-02-15 DIAGNOSIS — I69391 Dysphagia following cerebral infarction: Secondary | ICD-10-CM | POA: Diagnosis not present

## 2021-02-15 DIAGNOSIS — E785 Hyperlipidemia, unspecified: Secondary | ICD-10-CM | POA: Diagnosis not present

## 2021-02-15 DIAGNOSIS — I5032 Chronic diastolic (congestive) heart failure: Secondary | ICD-10-CM | POA: Diagnosis not present

## 2021-02-15 DIAGNOSIS — I38 Endocarditis, valve unspecified: Secondary | ICD-10-CM | POA: Diagnosis not present

## 2021-02-15 DIAGNOSIS — S065X3S Traumatic subdural hemorrhage with loss of consciousness of 1 hour to 5 hours 59 minutes, sequela: Secondary | ICD-10-CM | POA: Diagnosis not present

## 2021-02-15 DIAGNOSIS — T826XXD Infection and inflammatory reaction due to cardiac valve prosthesis, subsequent encounter: Secondary | ICD-10-CM | POA: Diagnosis not present

## 2021-02-15 DIAGNOSIS — I69354 Hemiplegia and hemiparesis following cerebral infarction affecting left non-dominant side: Secondary | ICD-10-CM

## 2021-02-15 DIAGNOSIS — R2689 Other abnormalities of gait and mobility: Secondary | ICD-10-CM | POA: Diagnosis not present

## 2021-02-15 DIAGNOSIS — N401 Enlarged prostate with lower urinary tract symptoms: Secondary | ICD-10-CM | POA: Diagnosis not present

## 2021-02-15 DIAGNOSIS — R338 Other retention of urine: Secondary | ICD-10-CM

## 2021-02-15 DIAGNOSIS — S069X3S Unspecified intracranial injury with loss of consciousness of 1 hour to 5 hours 59 minutes, sequela: Secondary | ICD-10-CM

## 2021-02-15 DIAGNOSIS — I5022 Chronic systolic (congestive) heart failure: Secondary | ICD-10-CM | POA: Diagnosis not present

## 2021-02-15 DIAGNOSIS — R1312 Dysphagia, oropharyngeal phase: Secondary | ICD-10-CM | POA: Diagnosis not present

## 2021-02-15 DIAGNOSIS — E870 Hyperosmolality and hypernatremia: Secondary | ICD-10-CM | POA: Diagnosis not present

## 2021-02-15 DIAGNOSIS — I6601 Occlusion and stenosis of right middle cerebral artery: Secondary | ICD-10-CM | POA: Diagnosis not present

## 2021-02-15 DIAGNOSIS — R41841 Cognitive communication deficit: Secondary | ICD-10-CM | POA: Diagnosis not present

## 2021-02-15 DIAGNOSIS — R278 Other lack of coordination: Secondary | ICD-10-CM | POA: Diagnosis not present

## 2021-02-15 DIAGNOSIS — G9341 Metabolic encephalopathy: Secondary | ICD-10-CM | POA: Diagnosis not present

## 2021-02-15 LAB — COMPREHENSIVE METABOLIC PANEL: Calcium: 9.7 (ref 8.7–10.7)

## 2021-02-15 LAB — BASIC METABOLIC PANEL
BUN: 23 — AB (ref 4–21)
CO2: 23 — AB (ref 13–22)
Chloride: 114 — AB (ref 99–108)
Creatinine: 0.8 (ref 0.6–1.3)
Glucose: 124
Potassium: 3.6 (ref 3.4–5.3)
Sodium: 154 — AB (ref 137–147)

## 2021-02-16 ENCOUNTER — Encounter: Payer: Self-pay | Admitting: Internal Medicine

## 2021-02-16 DIAGNOSIS — H538 Other visual disturbances: Secondary | ICD-10-CM | POA: Diagnosis not present

## 2021-02-16 DIAGNOSIS — R41840 Attention and concentration deficit: Secondary | ICD-10-CM | POA: Diagnosis not present

## 2021-02-16 DIAGNOSIS — M6389 Disorders of muscle in diseases classified elsewhere, multiple sites: Secondary | ICD-10-CM | POA: Diagnosis not present

## 2021-02-16 DIAGNOSIS — S065X3S Traumatic subdural hemorrhage with loss of consciousness of 1 hour to 5 hours 59 minutes, sequela: Secondary | ICD-10-CM | POA: Diagnosis not present

## 2021-02-16 DIAGNOSIS — I6601 Occlusion and stenosis of right middle cerebral artery: Secondary | ICD-10-CM | POA: Diagnosis not present

## 2021-02-16 DIAGNOSIS — R278 Other lack of coordination: Secondary | ICD-10-CM | POA: Diagnosis not present

## 2021-02-16 DIAGNOSIS — G9341 Metabolic encephalopathy: Secondary | ICD-10-CM | POA: Diagnosis not present

## 2021-02-16 DIAGNOSIS — I5022 Chronic systolic (congestive) heart failure: Secondary | ICD-10-CM | POA: Diagnosis not present

## 2021-02-16 DIAGNOSIS — I69354 Hemiplegia and hemiparesis following cerebral infarction affecting left non-dominant side: Secondary | ICD-10-CM | POA: Diagnosis not present

## 2021-02-16 DIAGNOSIS — I5032 Chronic diastolic (congestive) heart failure: Secondary | ICD-10-CM | POA: Diagnosis not present

## 2021-02-16 DIAGNOSIS — R293 Abnormal posture: Secondary | ICD-10-CM | POA: Diagnosis not present

## 2021-02-16 DIAGNOSIS — I69812 Visuospatial deficit and spatial neglect following other cerebrovascular disease: Secondary | ICD-10-CM | POA: Diagnosis not present

## 2021-02-16 DIAGNOSIS — I69391 Dysphagia following cerebral infarction: Secondary | ICD-10-CM | POA: Diagnosis not present

## 2021-02-16 DIAGNOSIS — R41841 Cognitive communication deficit: Secondary | ICD-10-CM | POA: Diagnosis not present

## 2021-02-16 LAB — COMPREHENSIVE METABOLIC PANEL
Calcium: 9.8 (ref 8.7–10.7)
GFR calc Af Amer: >90
GFR calc non Af Amer: 88.12

## 2021-02-16 LAB — BASIC METABOLIC PANEL
BUN: 22 — AB (ref 4–21)
CO2: 23 — AB (ref 13–22)
Chloride: 112 — AB (ref 99–108)
Creatinine: 0.7 (ref 0.6–1.3)
Glucose: 116
Potassium: 3.7 (ref 3.4–5.3)
Sodium: 148 — AB (ref 137–147)

## 2021-02-16 LAB — CBC AND DIFFERENTIAL
HCT: 31 — AB (ref 41–53)
Hemoglobin: 9.9 — AB (ref 13.5–17.5)
Platelets: 324 (ref 150–399)
WBC: 10.7

## 2021-02-16 LAB — CBC: RBC: 3.75 — AB (ref 3.87–5.11)

## 2021-02-16 NOTE — Progress Notes (Signed)
Location:   [facility] Room Number: [place] of Service:  SNF [phone]) Provider:  Veleta Miners MD  [name], MD  Patient Care Team: [name], MD as PCP - General (Internal Medicine) Larey Dresser, MD as PCP - Cardiology (Cardiology) [name], MD as Consulting Physician (Ophthalmology) [name], MD (Internal Medicine)  Extended Emergency Contact Information Primary Emergency Contact: [name] Address: [place], [place] [address] [name] of [place] Phone: [phone] Relation: Spouse Secondary Emergency Contact: [name], [place] [address] [name] of [place] Phone: [phone] Relation: Son  Code Status:  DNR Goals of care: Advanced Directive information Advanced Directives 02/01/2021  Does Patient Have a Medical Advance Directive? Yes  Type of Paramedic of [place];Living will;Out of facility DNR (pink MOST or yellow form)  Does patient want to make changes to medical advance directive? No - Patient declined  Copy of [place] in Chart? Yes - validated most recent copy scanned in chart (See row information)  Would patient like information on creating a medical advance directive? -  Pre-existing out of facility DNR order (yellow form or pink MOST form) -     Chief Complaint  Patient presents with   Acute Visit    Hypernatremia    HPI:  Pt is a 80 y.o. 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 Also PO decreased since his Wife had covid and could not come to visit Did BMP and His sodium is 154 He seems more Lethargic. 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) 08/2013   "I WAS IN AFIB AND THREW A CLOT, THE EFFECTS WERE TRANSITORY AND DIDNT LAST BUT FOR 30 MINUTES"    Thoracic aortic aneurysm    Past Surgical History:  Procedure Laterality Date  AORTIC AND MITRAL VALVE REPLACEMENT     09/2004   CARDIOVERSION     3 times from 2004-2006   CARDIOVERSION N/A 09/26/2013   Procedure: CARDIOVERSION;  Surgeon: Larey Dresser, MD;  Location: [facility];  Service: Cardiovascular;  Laterality: N/A;   CARDIOVERSION N/A 06/19/2014   Procedure: CARDIOVERSION;  Surgeon: Jerline Pain, MD;  Location: [facility];  Service: Cardiovascular;  Laterality: N/A;   CARDIOVERSION N/A 04/24/2017   Procedure: CARDIOVERSION;  Surgeon: Larey Dresser, MD;  Location: [facility];   Service: Cardiovascular;  Laterality: N/A;   CARDIOVERSION N/A 06/08/2017   Procedure: CARDIOVERSION;  Surgeon: Pixie Casino, MD;  Location: [facility] ENDOSCOPY;  Service: Cardiovascular;  Laterality: N/A;   CARDIOVERSION N/A 08/24/2017   Procedure: CARDIOVERSION;  Surgeon: Skeet Latch, MD;  Location: [facility] ENDOSCOPY;  Service: Cardiovascular;  Laterality: N/A;   CARDIOVERSION N/A 02/14/2018   Procedure: CARDIOVERSION;  Surgeon: Larey Dresser, MD;  Location: [facility] ENDOSCOPY;  Service: Cardiovascular;  Laterality: N/A;   COLONOSCOPY     CRANIOTOMY N/A 07/28/2020   Procedure: CRANIOTOMY FOR EVACUATION OF SUBDURAL HEMATOMA;  Surgeon: Eustace Behne, MD;  Location: [facility];  Service: Neurosurgery;  Laterality: N/A;   IR ANGIO VERTEBRAL SEL SUBCLAVIAN INNOMINATE UNI R MOD SED  08/07/2020   IR CT HEAD LTD  08/07/2020   IR PERCUTANEOUS ART THROMBECTOMY/INFUSION INTRACRANIAL INC DIAG ANGIO  08/07/2020   laminectomies     10/1975 and in 03/2005   RADIOLOGY WITH ANESTHESIA N/A 08/07/2020   Procedure: IR WITH ANESTHESIA;  Surgeon: Luanne Bras, MD;  Location: [place];  Service: Radiology;  Laterality: N/A;   TEE WITHOUT CARDIOVERSION N/A 12/18/2020   Procedure: TRANSESOPHAGEAL ECHOCARDIOGRAM (TEE);  Surgeon: Larey Dresser, MD;  Location: [facility] ENDOSCOPY;  Service: Cardiovascular;  Laterality: N/A;   [place] Right 04/03/2018   Procedure: TOTAL HIP ARTHROPLASTY ANTERIOR APPROACH;  Surgeon: Paralee Cancel, MD;  Location: [facility];  Service: Orthopedics;  Laterality: Right;  70 mins    Allergies  Allergen Reactions   Naproxen Hives   No [facility] Food Allergies Other (See Comments)    Scallops - distress, nausea and vomitting    Allergies as of 02/15/2021       Reactions   Naproxen Hives   No [facility] Food Allergies Other (See Comments)   Scallops - distress, nausea and vomitting        Medication List        Accurate as of February 15, 2021 11:59 PM. If  you have any questions, ask your nurse or doctor.          STOP taking these medications    feeding supplement (GLUCERNA SHAKE) Liqd   LORazepam 0.5 MG tablet Commonly known as: ATIVAN       TAKE these medications    amLODipine 10 MG tablet Commonly known as: NORVASC Take 1 tablet (10 mg total) by mouth daily.   amoxicillin 400 MG/5ML suspension Commonly known as: AMOXIL Take 6.3 mLs (500 mg total) by mouth 2 (two) times daily. Please flavor.  Duration to be determined by infectious disease MD during outpatient follow-up.   apixaban 5 MG Tabs tablet Commonly known as: ELIQUIS Take 1 tablet (5 mg total) by mouth 2 (two) times daily.   atorvastatin 80 MG tablet Commonly known as: LIPITOR Take 1 tablet (80 mg total) by mouth daily.   BIOTENE MOISTURIZING MOUTH MT Use as directed 2 sprays in the mouth  or throat in the morning, at noon, and at bedtime.   bisacodyl 10 MG suppository Commonly known as: DULCOLAX Place 1 suppository (10 mg total) rectally daily as needed for moderate constipation or mild constipation.   clobetasol cream 0.05 % Commonly known as: TEMOVATE Apply 1 application topically daily as needed (for skin irritation).   diphenhydramine-acetaminophen 25-500 MG Tabs tablet Commonly known as: TYLENOL PM Take 1 tablet by mouth at bedtime as needed.   dofetilide 125 MCG capsule Commonly known as: TIKOSYN Take 3 capsules (375 mcg total) by mouth 2 (two) times daily.   famotidine 20 MG tablet Commonly known as: PEPCID Take 20 mg by mouth at bedtime as needed for indigestion.   ferrous sulfate 220 (44 Fe) MG/5ML solution Take 240 mg by mouth every Monday, Wednesday, and Friday.   finasteride 5 MG tablet Commonly known as: PROSCAR Take 1 tablet (5 mg total) by mouth daily.   hydrocortisone cream 1 % Apply topically 2 (two) times daily as needed for itching (rash).   lacosamide 50 MG Tabs tablet Commonly known as: VIMPAT Take 1 tablet (50 mg  total) by mouth 2 (two) times daily.   levalbuterol 0.63 MG/3ML nebulizer solution Commonly known as: XOPENEX Take 0.63 mg by nebulization every 6 (six) hours as needed for wheezing or shortness of breath.   magnesium gluconate 500 MG tablet Commonly known as: MAGONATE Take 1 tablet (500 mg total) by mouth daily.   methenamine 1 g tablet Commonly known as: HIPREX Take 1 g by mouth 2 (two) times daily with a meal.   multivitamin with minerals Tabs tablet Take 1 tablet by mouth daily.   polyethylene glycol 17 g packet Commonly known as: MIRALAX / GLYCOLAX Take 17 g by mouth daily.   potassium chloride SA 20 MEQ tablet Commonly known as: KLOR-CON M Take 1 tablet (20 mEq total) by mouth daily.   senna-docusate 8.6-50 MG tablet Commonly known as: Senokot-S Take 1 tablet by mouth 2 (two) times daily.   Systane 0.4-0.3 % Soln Generic drug: Polyethyl Glycol-Propyl Glycol Place 2 drops into both eyes daily.   torsemide 20 MG tablet Commonly known as: DEMADEX Take 20 mg by mouth daily.        Review of Systems  Unable to perform ROS: Other   Immunization History  Administered Date(s) Administered   Fluad Quad(high Dose 65+) 12/06/2018, 12/09/2020   Influenza Whole 12/25/2008, 01/05/2010, 12/30/2011   Influenza, High Dose Seasonal PF 12/14/2016   Influenza,inj,Quad PF,6+ Mos 12/19/2013   Influenza,inj,quad, With Preservative 11/22/2017   Influenza-Unspecified 12/14/2015, 12/24/2019   Moderna Covid-19 Vaccine Bivalent Booster 10yrs & up 01/12/2021   Moderna SARS-COV2 Booster Vaccination 01/02/2020, 06/26/2020   Moderna Sars-Covid-2 Vaccination 04/08/2019, 05/05/2019   Pneumococcal Conjugate-13 12/19/2013   Pneumococcal Polysaccharide-23 09/24/2007, 05/22/2017   Td 12/25/2008   Tdap 03/22/2018   Zoster Recombinat (Shingrix) 07/16/2019, 10/28/2019   Zoster, Live 09/24/2007   Pertinent  Health Maintenance Due  Topic Date Due   FOOT EXAM  Never done   URINE  MICROALBUMIN  Never done   OPHTHALMOLOGY EXAM  04/07/2021 (Originally 10/04/1950)   HEMOGLOBIN A1C  06/17/2021   INFLUENZA VACCINE  Completed   COLONOSCOPY (Pts 45-1yrs Insurance coverage will need to be confirmed)  Discontinued   Fall Risk 01/25/2021 01/26/2021 01/26/2021 01/26/2021 01/27/2021  Falls in the past year? - - - - -  Was there an injury with Fall? - - - - -  Fall Risk Category Calculator - - - - -  Fall Risk Category - - - - -  Patient Fall Risk Level High fall risk High fall risk High fall risk High fall risk High fall risk  Patient at Risk for Falls Due to - - - - -  Patient at Risk for Falls Due to - - - - -  Fall risk Follow up - - - - -   Functional Status Survey:    Vitals:   02/16/21 0844  BP: (!) 157/54  Pulse: 91  Resp: 16  Temp: 98.3 F (36.8 C)  SpO2: 99%  Weight: 151 lb 3.2 oz (68.6 kg)  Height: 5\' 7"  (1.702 m)   Body mass index is 23.68 kg/m. Physical Exam Vitals reviewed.  Constitutional:      Comments: Sleepy  HENT:     Head: Normocephalic.     Nose: Nose normal.     Mouth/Throat:     Mouth: Mucous membranes are dry.     Pharynx: Oropharynx is clear.  Eyes:     Pupils: Pupils are equal, round, and reactive to light.  Cardiovascular:     Rate and Rhythm: Normal rate and regular rhythm.     Pulses: Normal pulses.     Heart sounds: No murmur heard. Pulmonary:     Effort: Pulmonary effort is normal. No respiratory distress.     Breath sounds: Normal breath sounds. No rales.  Abdominal:     General: Abdomen is flat. Bowel sounds are normal.     Palpations: Abdomen is soft.  Musculoskeletal:        General: No swelling.     Cervical back: Neck supple.     Comments: Mild Swelling in right  Lower Leg  Skin:    General: Skin is warm.  Neurological:     Comments: Seems more Sleepy Not responding today Left Hemiparesis  Psychiatric:        Mood and Affect: Mood normal.        Thought Content: Thought content normal.    Labs  reviewed: Recent Labs    12/18/20 1630 12/19/20 0610 12/19/20 1632 12/20/20 0153 01/25/21 4196 01/25/21 0853 01/26/21 0435 01/27/21 0538 02/01/21 0000 02/08/21 0000 02/09/21 0000 02/11/21 0000 02/15/21 0000  NA  --  139  --    < > 143  --  143 140   < > 150* 147 146 154*  K  --  3.5  --    < > 3.6  --  3.8 3.5   < > 3.7 3.3* 3.4 3.6  CL  --  108  --    < > 107  --  104 107   < > 107 104 111* 114*  CO2  --  23  --    < > 27  --  26 25   < > 22 21 25* 23*  GLUCOSE  --  186*  --    < > 93  --  105* 137*  --   --   --   --   --   BUN  --  16  --    < > 11  --  12 15   < > 23* 24* 19 23*  CREATININE  --  0.76  --    < > 0.94  --  0.95 1.06   < > 0.9 0.9 0.7 0.8  CALCIUM  --  7.7*  --    < > 8.2*  --  8.7* 8.7*   < > 9.9 10.0 9.4  9.7  MG 1.9 2.0 1.9   < >  --    < > 2.2 2.4  --  2.1  --   --   --   PHOS 3.1 2.7 2.4*  --   --   --   --   --   --   --   --   --   --    < > = values in this interval not displayed.   Recent Labs    01/03/21 0221 01/08/21 0000 01/20/21 1631 01/23/21 1355 02/08/21 0000  AST 32   < > 27 37 27  ALT 63*   < > 35 34 34  ALKPHOS 94   < > 115 107 207*  BILITOT 0.4  --  0.6 0.7  --   PROT 6.3*  --  6.9 6.9  --   ALBUMIN 1.9*   < > 2.5* 2.4* 3.8   < > = values in this interval not displayed.   Recent Labs    08/16/20 0427 08/17/20 0340 12/14/20 1245 12/14/20 1504 12/16/20 0130 12/18/20 0221 01/24/21 0154 01/25/21 0623 01/26/21 0435 02/01/21 0000 02/05/21 1111 02/08/21 0000 02/11/21 0000  WBC 7.9   < > 12.0*   < > 10.7*   < > 10.0  --  10.4   < > 9.8 11.0 10.7  NEUTROABS 5.7  --  9.3*  --  7.7  --   --   --   --   --   --   --   --   HGB 9.0*   < > 11.7*   < > 9.9*   < > 7.3*   < > 7.9*   < > 9.8* 9.4* 9.1*  HCT 28.2*   < > 36.8*   < > 30.7*   < > 23.3*   < > 25.9*   < > 31.8* 31* 30*  MCV 98.6   < > 90.0   < > 88.0   < > 88.3  --  88.7  --  85.0  --   --   PLT 177   < > 119*   < > 106*   < > 395  --  417*   < > 462* 406* 335   < > = values  in this interval not displayed.   Lab Results  Component Value Date   TSH 1.773 01/24/2021   Lab Results  Component Value Date   HGBA1C 7.3 (H) 12/17/2020   Lab Results  Component Value Date   CHOL 119 08/08/2020   HDL 42 08/08/2020   LDLCALC 56 08/08/2020   TRIG 105 08/08/2020   CHOLHDL 2.8 08/08/2020    Significant Diagnostic Results in last 30 days:  CT Head Wo Contrast  Result Date: 01/23/2021 CLINICAL DATA:  Stroke follow-up. EXAM: CT HEAD WITHOUT CONTRAST TECHNIQUE: Contiguous axial images were obtained from the base of the skull through the vertex without intravenous contrast. COMPARISON:  December 14, 2020 FINDINGS: Brain: A right MCA stroke with encephalomalacia is stable. Ventricular dilatation remains. No acute subdural, epidural, or subarachnoid hemorrhage identified. Cerebellum, brainstem, and basal cisterns are normal. White matter changes noted. No acute ischemia identified. A small posterior left parietal infarct, unchanged. No mass effect or midline shift. Vascular: Calcified atherosclerosis in the intracranial carotids. Skull: Previous right craniotomy. Sinuses/Orbits: No acute finding. Other: No other abnormalities. IMPRESSION: Chronic right MCA stroke. Chronic left posterior parietal stroke. No acute intracranial abnormality. Electronically Signed   By: Dorise Bullion  III M.D.   On: 01/23/2021 15:46   MR BRAIN WO CONTRAST  Result Date: 01/24/2021 CLINICAL DATA:  Delirium EXAM: MRI HEAD WITHOUT CONTRAST TECHNIQUE: Multiplanar, multiecho pulse sequences of the brain and surrounding structures were obtained without intravenous contrast. COMPARISON:  CT head dated 1 day prior, brain MRI 12/18/2020 FINDINGS: Brain: There is significant artifact obscuring large portions of the axial DWI sequence including the entire posterior fossa. There is no evidence of acute ischemia on the coronal DWI sequence. There is no evidence of acute intracranial hemorrhage or acute extra-axial  fluid collection. There is marked global parenchymal volume loss with enlargement of the ventricular system. There is an unchanged large right MCA territorial infarct with associated encephalomalacia and ex vacuo dilatation of the right lateral ventricle. An additional remote infarct in the left parietal lobe is unchanged. There is extensive FLAIR signal abnormality throughout the subcortical and periventricular white matter likely reflecting sequela of advanced chronic white matter microangiopathy. There is a thin subdural collection overlying the right cerebral hemisphere underlying a craniotomy site which may reflect dural thickening or a tiny chronic subdural hematoma. There is SWI signal dropout in the right frontal lobe consistent with interval evolution of the previously seen intraparenchymal hematoma on the MRI of 12/18/2020. There are a few additional scattered punctate chronic microhemorrhages, nonspecific. There is no solid mass lesion.  There is no midline shift. Vascular: Normal flow voids. Skull and upper cervical spine: Postsurgical changes reflecting right frontal parietal craniotomy are again seen. There is no suspicious marrow signal abnormality. Sinuses/Orbits: The paranasal sinuses are clear. Bilateral lens implants are in place. The globes and orbits are otherwise unremarkable. Other: None. IMPRESSION: 1. No evidence of acute intracranial pathology. 2. Interval expected evolution of the small intraparenchymal hematoma in the right frontal lobe since 12/18/2020. 3. Unchanged remote MCA territorial infarct and small remote infarct in the left parietal lobe. 4. 3 mm thick extra-axial signal abnormality overlying the right cerebral hemisphere deep to the craniotomy site may reflect a small chronic subdural hematoma or dural thickening. The appearance is not significantly changed since 12/18/2020. 5. Unchanged advanced global parenchymal volume loss and chronic white matter microangiopathy.  Electronically Signed   By: Valetta Mole M.D.   On: 01/24/2021 16:53   DG Chest Port 1 View  Result Date: 01/23/2021 CLINICAL DATA:  Weight gain.  Shortness of breath. EXAM: PORTABLE CHEST 1 VIEW COMPARISON:  December 31, 2020 FINDINGS: Stable cardiomegaly. Stable hila and mediastinum. A right PICC line terminating central SVC. No pneumothorax. Bilateral pulmonary opacities, right greater than left. No other acute abnormalities. IMPRESSION: Right greater than left pulmonary opacities may represent asymmetric edema, especially given the cardiomegaly and history of weight gain. A multifocal infectious process is considered less likely. Recommend clinical correlation and follow-up to complete resolution. Electronically Signed   By: Dorise Bullion III M.D.   On: 01/23/2021 15:40   EEG adult  Result Date: 01/24/2021 Lora Havens, MD     01/24/2021  6:24 PM Patient Name: LUZ BURCHER MRN: [id number] Epilepsy Attending: Lora Havens Referring Physician/Provider: Dr Derrick Ravel Date: 01/24/2021 Duration: 23.17 mins Patient history: 79yo m with h/o right MCA stroke, epilepsy who presented with ams. EEG to evaluate for seizure Level of alertness: Awake AEDs during EEG study: LCM Technical aspects: This EEG study was done with scalp electrodes positioned according to the 10-20 International system of electrode placement. Electrical activity was acquired at a sampling rate of 500Hz  and reviewed with a high frequency  filter of 70Hz  and a low frequency filter of 1Hz . EEG data were recorded continuously and digitally stored. Description: The posterior dominant rhythm consists of 8-9 Hz activity of moderate voltage (25-35 uV) seen predominantly in posterior head regions, symmetric and reactive to eye opening and eye closing. EEG showed continuous low amplitude 3 to 6 Hz theta-delta slowing in right frontotemporal region. Patient was noted to have right upper extremity tremor like movement  intermittently  throughout the study. Concomitant eeg before, during and after the events didn't show any eeg change to suggest seizure Hyperventilation and photic stimulation were not performed.   ABNORMALITY - Continuous slow, right frontotemporal region. IMPRESSION: This study is suggestive of cortical dysfunction arising from  right frontotemporal region, nonspecific etiology but likely secondary to underlying stroke. No seizures or epileptiform discharges were seen throughout the recording. Patient was noted to have right upper extremity tremor like movement  intermittently throughout the study without concomitant eeg change. These episodes were most likely NOT epileptic. Priyanka Barbra Sarks    Assessment/Plan Hypernatremia Discussed with wife and Son This time cause can be that wife was not there to encourage po Fluids But he continues to struggle to keep with his Oral Water Intake We discussed again of Tube feed or seeing Nephrology for Hypernatremia or Going again to the hospital She wants to try this and Encourage Po fluids Started on D5 W 75 cc /hour for 500 cc Repeat BMP after that  Chronic diastolic CHF (congestive heart failure) (HCC) Using Demadex prn due to hypernatremia Prosthetic valve endocarditis, subsequent encounter On Amoxicillin indefinite 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

## 2021-02-17 ENCOUNTER — Encounter: Payer: Self-pay | Admitting: Orthopedic Surgery

## 2021-02-17 ENCOUNTER — Non-Acute Institutional Stay (SKILLED_NURSING_FACILITY): Payer: Medicare Other | Admitting: Orthopedic Surgery

## 2021-02-17 DIAGNOSIS — I69354 Hemiplegia and hemiparesis following cerebral infarction affecting left non-dominant side: Secondary | ICD-10-CM | POA: Diagnosis not present

## 2021-02-17 DIAGNOSIS — I6601 Occlusion and stenosis of right middle cerebral artery: Secondary | ICD-10-CM | POA: Diagnosis not present

## 2021-02-17 DIAGNOSIS — R338 Other retention of urine: Secondary | ICD-10-CM | POA: Diagnosis not present

## 2021-02-17 DIAGNOSIS — I38 Endocarditis, valve unspecified: Secondary | ICD-10-CM

## 2021-02-17 DIAGNOSIS — T826XXD Infection and inflammatory reaction due to cardiac valve prosthesis, subsequent encounter: Secondary | ICD-10-CM

## 2021-02-17 DIAGNOSIS — M6389 Disorders of muscle in diseases classified elsewhere, multiple sites: Secondary | ICD-10-CM | POA: Diagnosis not present

## 2021-02-17 DIAGNOSIS — G9341 Metabolic encephalopathy: Secondary | ICD-10-CM | POA: Diagnosis not present

## 2021-02-17 DIAGNOSIS — R1312 Dysphagia, oropharyngeal phase: Secondary | ICD-10-CM | POA: Diagnosis not present

## 2021-02-17 DIAGNOSIS — I482 Chronic atrial fibrillation, unspecified: Secondary | ICD-10-CM | POA: Diagnosis not present

## 2021-02-17 DIAGNOSIS — I5032 Chronic diastolic (congestive) heart failure: Secondary | ICD-10-CM | POA: Diagnosis not present

## 2021-02-17 DIAGNOSIS — R293 Abnormal posture: Secondary | ICD-10-CM | POA: Diagnosis not present

## 2021-02-17 DIAGNOSIS — S065X3S Traumatic subdural hemorrhage with loss of consciousness of 1 hour to 5 hours 59 minutes, sequela: Secondary | ICD-10-CM | POA: Diagnosis not present

## 2021-02-17 DIAGNOSIS — E870 Hyperosmolality and hypernatremia: Secondary | ICD-10-CM

## 2021-02-17 DIAGNOSIS — R278 Other lack of coordination: Secondary | ICD-10-CM | POA: Diagnosis not present

## 2021-02-17 DIAGNOSIS — I69391 Dysphagia following cerebral infarction: Secondary | ICD-10-CM | POA: Diagnosis not present

## 2021-02-17 DIAGNOSIS — R41840 Attention and concentration deficit: Secondary | ICD-10-CM | POA: Diagnosis not present

## 2021-02-17 DIAGNOSIS — N401 Enlarged prostate with lower urinary tract symptoms: Secondary | ICD-10-CM

## 2021-02-17 DIAGNOSIS — I5022 Chronic systolic (congestive) heart failure: Secondary | ICD-10-CM | POA: Diagnosis not present

## 2021-02-17 DIAGNOSIS — S069X3S Unspecified intracranial injury with loss of consciousness of 1 hour to 5 hours 59 minutes, sequela: Secondary | ICD-10-CM | POA: Diagnosis not present

## 2021-02-17 DIAGNOSIS — H538 Other visual disturbances: Secondary | ICD-10-CM | POA: Diagnosis not present

## 2021-02-17 DIAGNOSIS — I69812 Visuospatial deficit and spatial neglect following other cerebrovascular disease: Secondary | ICD-10-CM | POA: Diagnosis not present

## 2021-02-17 DIAGNOSIS — R41841 Cognitive communication deficit: Secondary | ICD-10-CM | POA: Diagnosis not present

## 2021-02-17 NOTE — Progress Notes (Signed)
Location:   [name] Room Number: [place] of Service:  SNF ([phone]) Provider:  Windell Moulding, NP  [name], MD  Patient Care Team: [name], MD as PCP - General ([name]) Larey Dresser, MD as PCP - Cardiology (Cardiology) [name], MD as Consulting Physician (Ophthalmology) [name], MD ([name])  Extended Emergency Contact Information Primary Emergency Contact: [name] Address: [address], [name] [address] [name] of [name] Phone: [phone] Relation: Spouse Secondary Emergency Contact: [name], [place] [address] [name] of [name] Phone: [phone] Relation: Son  Code Status:  DNR Goals of care: Advanced Directive information Advanced Directives 02/17/2021  Does Patient Have a Medical Advance Directive? Yes  Type of Paramedic of [place];Living will  Does patient want to make changes to medical advance directive? No - Patient declined  Copy of [place] in Chart? Yes - validated most recent copy scanned in chart (See row information)  Would patient like information on creating a medical advance directive? -  Pre-existing out of facility DNR order (yellow form or pink MOST form) -     Chief Complaint  Patient presents with   Acute Visit    Lethargic    HPI:  Pt is a 80 y.o. male seen today for an acute visit for hypernatremia.   He currently resides on the rehabilitation unit at [name]. 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   Hospitalized 10/10-10/31 for acute metabolic encephalopathy due to hypernatremia and enterococcal bacteremia prosthetic valve endocarditis- blood cultures enterococcus faecalis, urine culture staph  aureus, treated with rocephin and ampicillin till 11/24, remains on amoxicillin 500 mg po bid indefinitely. Hospitalization complicated by another CVA due to septic emboli.   Hospitalized 11/19- 11/23 due to CHF and weight gain. Admit weight 184 lbs. He was given diuretics and weight reduced to 163 lbs. Discharged on torsemide.   Since return to rehab, he has had elevating NA and poor po intake. 12/12 NA 154, he was given 500cc bolus of D5W. Today, nursing reports he is more lethargic and drinking less. He was able to work with ST this morning. At this time his recorded po intake is 885cc. During our encounter, he was able to squeeze my hand and take a sip of water when I asked. Treatment options discussed with wife. Plan to given another small bolus of D5W and repeat labs tomorrow.   In addition, he is pocketing [name] in his mouth. Nursing reports using a spoon to collect crushed medications out of his mouth because he will not swallow them. Appears he is less compliant with medications that do not taste well. Discussed smaller med passes with crushed medications. Will discontinue oral iron due to non compliance.    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    Other and unspecified hyperlipidemia    Pre-diabetes    Previous back surgery 1978, jan 2007   Psychosexual dysfunction with inhibited sexual excitement    SOB (shortness of breath)    "with heavy exercise"   Stroke ([name]) 08/2013   "I WAS IN AFIB AND  THREW A CLOT, THE EFFECTS WERE TRANSITORY AND DIDNT LAST BUT FOR 30 MINUTES"    Thoracic aortic aneurysm    Past Surgical History:  Procedure Laterality Date   AORTIC AND MITRAL VALVE REPLACEMENT     09/2004   CARDIOVERSION     3 times from 2004-2006   CARDIOVERSION N/A 09/26/2013   Procedure: CARDIOVERSION;  Surgeon: Larey Dresser, MD;  Location: [name];  Service: Cardiovascular;  Laterality: N/A;   CARDIOVERSION N/A 06/19/2014   Procedure: CARDIOVERSION;  Surgeon: Jerline Pain, MD;  Location: [name];  Service: Cardiovascular;  Laterality: N/A;   CARDIOVERSION N/A 04/24/2017   Procedure: CARDIOVERSION;  Surgeon: Larey Dresser, MD;  Location: [place];  Service: Cardiovascular;  Laterality: N/A;   CARDIOVERSION N/A 06/08/2017   Procedure: CARDIOVERSION;  Surgeon: Pixie Casino, MD;  Location: [name];  Service: Cardiovascular;  Laterality: N/A;   CARDIOVERSION N/A 08/24/2017   Procedure: CARDIOVERSION;  Surgeon: Skeet Latch, MD;  Location: [name];  Service: Cardiovascular;  Laterality: N/A;   CARDIOVERSION N/A 02/14/2018   Procedure: CARDIOVERSION;  Surgeon: Larey Dresser, MD;  Location: [name];  Service: Cardiovascular;  Laterality: N/A;   COLONOSCOPY     CRANIOTOMY N/A 07/28/2020   Procedure: CRANIOTOMY FOR EVACUATION OF SUBDURAL HEMATOMA;  Surgeon: Eustace Prestridge, MD;  Location: [name];  Service: Neurosurgery;  Laterality: N/A;   IR ANGIO VERTEBRAL SEL SUBCLAVIAN INNOMINATE UNI R MOD SED  08/07/2020   IR CT HEAD LTD  08/07/2020   IR PERCUTANEOUS ART THROMBECTOMY/INFUSION INTRACRANIAL INC DIAG ANGIO  08/07/2020   laminectomies     10/1975 and in 03/2005   RADIOLOGY WITH ANESTHESIA N/A 08/07/2020   Procedure: IR WITH ANESTHESIA;  Surgeon: Luanne Bras, MD;  Location: [name];  Service: Radiology;  Laterality: N/A;   TEE WITHOUT CARDIOVERSION N/A 12/18/2020   Procedure: TRANSESOPHAGEAL ECHOCARDIOGRAM (TEE);  Surgeon: Larey Dresser, MD;   Location: [name];  Service: Cardiovascular;  Laterality: N/A;   [place] Right 04/03/2018   Procedure: TOTAL HIP ARTHROPLASTY ANTERIOR APPROACH;  Surgeon: Paralee Cancel, MD;  Location: [facility];  Service: Orthopedics;  Laterality: Right;  70 mins    Allergies  Allergen Reactions   Naproxen Hives   No [facility] Food Allergies Other (See Comments)    Scallops - distress, nausea and vomitting    Allergies as of 02/17/2021       Reactions   Naproxen Hives   No [facility] Food Allergies Other (See Comments)   Scallops - distress, nausea and vomitting        Medication List        Accurate as of February 17, 2021  3:38 PM. If you have any questions, ask your nurse or doctor.          amLODipine 10 MG tablet Commonly known as: NORVASC Take 1 tablet (10 mg total) by mouth daily.   amoxicillin 400 MG/5ML suspension Commonly known as: AMOXIL Take 6.3 mLs (500 mg  total) by mouth 2 (two) times daily. Please flavor.  Duration to be determined by infectious disease MD during outpatient follow-up.   apixaban 5 MG Tabs tablet Commonly known as: ELIQUIS Take 1 tablet (5 mg total) by mouth 2 (two) times daily.   atorvastatin 80 MG tablet Commonly known as: LIPITOR Take 1 tablet (80 mg total) by mouth daily.   BIOTENE MOISTURIZING MOUTH MT Use as directed 2 sprays in the mouth or throat in the morning, at noon, and at bedtime.   bisacodyl 10 MG suppository Commonly known as: DULCOLAX Place 1 suppository (10 mg total) rectally daily as needed for moderate constipation or mild constipation.   clobetasol cream 0.05 % Commonly known as: TEMOVATE Apply 1 application topically daily as needed (for skin irritation).   diphenhydramine-acetaminophen 25-500 MG Tabs tablet Commonly known as: TYLENOL PM Take 1 tablet by mouth at bedtime as needed.   dofetilide 125 MCG capsule Commonly known as: TIKOSYN Take 3 capsules (375 mcg total) by  mouth 2 (two) times daily.   famotidine 20 MG tablet Commonly known as: PEPCID Take 20 mg by mouth at bedtime as needed for indigestion.   ferrous sulfate 220 (44 Fe) MG/5ML solution Take 240 mg by mouth every Monday, Wednesday, and Friday.   finasteride 5 MG tablet Commonly known as: PROSCAR Take 1 tablet (5 mg total) by mouth daily.   hydrocortisone cream 1 % Apply topically 2 (two) times daily as needed for itching (rash).   lacosamide 50 MG Tabs tablet Commonly known as: VIMPAT Take 1 tablet (50 mg total) by mouth 2 (two) times daily.   levalbuterol 0.63 MG/3ML nebulizer solution Commonly known as: XOPENEX Take 0.63 mg by nebulization every 6 (six) hours as needed for wheezing or shortness of breath.   magnesium gluconate 500 MG tablet Commonly known as: MAGONATE Take 1 tablet (500 mg total) by mouth daily.   methenamine 1 g tablet Commonly known as: HIPREX Take 1 g by mouth 2 (two) times daily with a meal.   multivitamin with minerals Tabs tablet Take 1 tablet by mouth daily.   polyethylene glycol 17 g packet Commonly known as: MIRALAX / GLYCOLAX Take 17 g by mouth daily.   potassium chloride SA 20 MEQ tablet Commonly known as: KLOR-CON M Take 1 tablet (20 mEq total) by mouth daily.   senna-docusate 8.6-50 MG tablet Commonly known as: Senokot-S Take 1 tablet by mouth 2 (two) times daily.   Systane 0.4-0.3 % Soln Generic drug: Polyethyl Glycol-Propyl Glycol Place 2 drops into both eyes daily.   torsemide 20 MG tablet Commonly known as: DEMADEX Take 20 mg by mouth daily as needed.        Review of Systems  Unable to perform ROS: Patient nonverbal   Immunization History  Administered Date(s) Administered   Fluad Quad(high Dose 65+) 12/06/2018, 12/09/2020   Influenza Whole 12/25/2008, 01/05/2010, 12/30/2011   Influenza, High Dose Seasonal PF 12/14/2016   Influenza,inj,Quad PF,6+ Mos 12/19/2013   Influenza,inj,quad, With Preservative 11/22/2017    Influenza-Unspecified 12/14/2015, 12/24/2019   Moderna Covid-19 Vaccine Bivalent Booster 19yrs & up 01/12/2021   Moderna SARS-COV2 Booster Vaccination 01/02/2020, 06/26/2020   Moderna Sars-Covid-2 Vaccination 04/08/2019, 05/05/2019   Pneumococcal Conjugate-13 12/19/2013   Pneumococcal Polysaccharide-23 09/24/2007, 05/22/2017   Td 12/25/2008   Tdap 03/22/2018   Zoster Recombinat (Shingrix) 07/16/2019, 10/28/2019   Zoster, Live 09/24/2007   Pertinent  Health Maintenance Due  Topic Date Due   FOOT EXAM  Never done   URINE MICROALBUMIN  Never done   OPHTHALMOLOGY EXAM  04/07/2021 (Originally 10/04/1950)   HEMOGLOBIN A1C  06/17/2021   INFLUENZA VACCINE  Completed   COLONOSCOPY (Pts 45-43yrs Insurance coverage will need to be confirmed)  Discontinued   Fall Risk 01/25/2021 01/26/2021 01/26/2021 01/26/2021 01/27/2021  Falls in the past year? - - - - -  Was there an injury with Fall? - - - - -  Fall Risk Category Calculator - - - - -  Fall Risk Category - - - - -  Patient Fall Risk Level High fall risk High fall risk High fall risk High fall risk High fall risk  Patient at Risk for Falls Due to - - - - -  Patient at Risk for Falls Due to - - - - -  Fall risk Follow up - - - - -   Functional Status Survey:    Vitals:   02/17/21 1524  BP: 125/78  Pulse: 81  Resp: 18  Temp: 98.2 F (36.8 C)  SpO2: 96%  Weight: 150 lb (68 kg)  Height: 5\' 7"  (1.702 m)   Body mass index is 23.49 kg/m. Physical Exam Vitals reviewed.  Constitutional:      General: He is not in acute distress. HENT:     Head: Normocephalic.     Mouth/Throat:     Mouth: Mucous membranes are moist.  Eyes:     General:        Right eye: No discharge.        Left eye: No discharge.  Neck:     Vascular: No carotid bruit.  Cardiovascular:     Rate and Rhythm: Normal rate. Rhythm irregular.     Pulses: Normal pulses.     Heart sounds: Normal heart sounds. No murmur heard. Pulmonary:     Effort: Pulmonary  effort is normal. No respiratory distress.     Breath sounds: Normal breath sounds. No wheezing or rales.  Abdominal:     General: Bowel sounds are normal. There is no distension.     Palpations: Abdomen is soft.     Tenderness: There is no abdominal tenderness.  Musculoskeletal:     Cervical back: Normal range of motion.     Right lower leg: No edema.     Left lower leg: No edema.  Lymphadenopathy:     Cervical: No cervical adenopathy.  Skin:    General: Skin is warm and dry.     Capillary Refill: Capillary refill takes less than 2 seconds.  Neurological:     General: No focal deficit present.     Mental Status: He is alert. Mental status is at baseline.     Motor: Weakness present.     Gait: Gait abnormal.     Comments: Left sided weakness, left facial droop  Psychiatric:        Mood and Affect: Mood normal.        Behavior: Behavior normal.     Comments: Alert to self, able to follow commands, nonverbal during encounter    Labs reviewed: Recent Labs    12/18/20 1630 12/19/20 0610 12/19/20 1632 12/20/20 0153 01/25/21 0623 01/25/21 0853 01/26/21 0435 01/27/21 0538 02/01/21 0000 02/08/21 0000 02/09/21 0000 02/11/21 0000 02/15/21 0000  NA  --  139  --    < > 143  --  143 140   < > 150* 147 146 154*  K  --  3.5  --    < > 3.6  --  3.8 3.5   < >  3.7 3.3* 3.4 3.6  CL  --  108  --    < > 107  --  104 107   < > 107 104 111* 114*  CO2  --  23  --    < > 27  --  26 25   < > 22 21 25* 23*  GLUCOSE  --  186*  --    < > 93  --  105* 137*  --   --   --   --   --   BUN  --  16  --    < > 11  --  12 15   < > 23* 24* 19 23*  CREATININE  --  0.76  --    < > 0.94  --  0.95 1.06   < > 0.9 0.9 0.7 0.8  CALCIUM  --  7.7*  --    < > 8.2*  --  8.7* 8.7*   < > 9.9 10.0 9.4 9.7  MG 1.9 2.0 1.9   < >  --    < > 2.2 2.4  --  2.1  --   --   --   PHOS 3.1 2.7 2.4*  --   --   --   --   --   --   --   --   --   --    < > = values in this interval not displayed.   Recent Labs     01/03/21 0221 01/08/21 0000 01/20/21 1631 01/23/21 1355 02/08/21 0000  AST 32   < > 27 37 27  ALT 63*   < > 35 34 34  ALKPHOS 94   < > 115 107 207*  BILITOT 0.4  --  0.6 0.7  --   PROT 6.3*  --  6.9 6.9  --   ALBUMIN 1.9*   < > 2.5* 2.4* 3.8   < > = values in this interval not displayed.   Recent Labs    08/16/20 0427 08/17/20 0340 12/14/20 1245 12/14/20 1504 12/16/20 0130 12/18/20 0221 01/24/21 0154 01/25/21 0623 01/26/21 0435 02/01/21 0000 02/05/21 1111 02/08/21 0000 02/11/21 0000  WBC 7.9   < > 12.0*   < > 10.7*   < > 10.0  --  10.4   < > 9.8 11.0 10.7  NEUTROABS 5.7  --  9.3*  --  7.7  --   --   --   --   --   --   --   --   HGB 9.0*   < > 11.7*   < > 9.9*   < > 7.3*   < > 7.9*   < > 9.8* 9.4* 9.1*  HCT 28.2*   < > 36.8*   < > 30.7*   < > 23.3*   < > 25.9*   < > 31.8* 31* 30*  MCV 98.6   < > 90.0   < > 88.0   < > 88.3  --  88.7  --  85.0  --   --   PLT 177   < > 119*   < > 106*   < > 395  --  417*   < > 462* 406* 335   < > = values in this interval not displayed.   Lab Results  Component Value Date   TSH 1.773 01/24/2021   Lab Results  Component Value Date   HGBA1C 7.3 (H) 12/17/2020   Lab  Results  Component Value Date   CHOL 119 08/08/2020   HDL 42 08/08/2020   LDLCALC 56 08/08/2020   TRIG 105 08/08/2020   CHOLHDL 2.8 08/08/2020    Significant Diagnostic Results in last 30 days:  CT Head Wo Contrast  Result Date: 01/23/2021 CLINICAL DATA:  Stroke follow-up. EXAM: CT HEAD WITHOUT CONTRAST TECHNIQUE: Contiguous axial images were obtained from the base of the skull through the vertex without intravenous contrast. COMPARISON:  December 14, 2020 FINDINGS: Brain: A right MCA stroke with encephalomalacia is stable. Ventricular dilatation remains. No acute subdural, epidural, or subarachnoid hemorrhage identified. Cerebellum, brainstem, and basal cisterns are normal. White matter changes noted. No acute ischemia identified. A small posterior left parietal  infarct, unchanged. No mass effect or midline shift. Vascular: Calcified atherosclerosis in the intracranial carotids. Skull: Previous right craniotomy. Sinuses/Orbits: No acute finding. Other: No other abnormalities. IMPRESSION: Chronic right MCA stroke. Chronic left posterior parietal stroke. No acute intracranial abnormality. Electronically Signed   By: Dorise Bullion III M.D.   On: 01/23/2021 15:46   MR BRAIN WO CONTRAST  Result Date: 01/24/2021 CLINICAL DATA:  [place] EXAM: MRI HEAD WITHOUT CONTRAST TECHNIQUE: Multiplanar, multiecho pulse sequences of the brain and surrounding structures were obtained without intravenous contrast. COMPARISON:  CT head dated 1 day prior, brain MRI 12/18/2020 FINDINGS: Brain: There is significant artifact obscuring large portions of the axial DWI sequence including the entire posterior fossa. There is no evidence of acute ischemia on the coronal DWI sequence. There is no evidence of acute intracranial hemorrhage or acute extra-axial fluid collection. There is marked global parenchymal volume loss with enlargement of the ventricular system. There is an unchanged large right MCA territorial infarct with associated encephalomalacia and ex vacuo dilatation of the right lateral ventricle. An additional remote infarct in the left parietal lobe is unchanged. There is extensive FLAIR signal abnormality throughout the subcortical and periventricular white matter likely reflecting sequela of advanced chronic white matter microangiopathy. There is a thin subdural collection overlying the right cerebral hemisphere underlying a craniotomy site which may reflect dural thickening or a tiny chronic subdural hematoma. There is SWI signal dropout in the right frontal lobe consistent with interval evolution of the previously seen intraparenchymal hematoma on the MRI of 12/18/2020. There are a few additional scattered punctate chronic microhemorrhages, nonspecific. There is no solid mass  lesion.  There is no midline shift. Vascular: Normal flow voids. Skull and upper cervical spine: Postsurgical changes reflecting right frontal parietal craniotomy are again seen. There is no suspicious marrow signal abnormality. Sinuses/Orbits: The paranasal sinuses are clear. Bilateral lens implants are in place. The globes and orbits are otherwise unremarkable. Other: None. IMPRESSION: 1. No evidence of acute intracranial pathology. 2. Interval expected evolution of the small intraparenchymal hematoma in the right frontal lobe since 12/18/2020. 3. Unchanged remote MCA territorial infarct and small remote infarct in the left parietal lobe. 4. 3 mm thick extra-axial signal abnormality overlying the right cerebral hemisphere deep to the craniotomy site may reflect a small chronic subdural hematoma or dural thickening. The appearance is not significantly changed since 12/18/2020. 5. Unchanged advanced global parenchymal volume loss and chronic white matter microangiopathy. Electronically Signed   By: Valetta Mole M.D.   On: 01/24/2021 16:53   DG Chest Port 1 View  Result Date: 01/23/2021 CLINICAL DATA:  Weight gain.  Shortness of breath. EXAM: PORTABLE CHEST 1 VIEW COMPARISON:  December 31, 2020 FINDINGS: Stable cardiomegaly. Stable hila and mediastinum. A right PICC line terminating  central SVC. No pneumothorax. Bilateral pulmonary opacities, right greater than left. No other acute abnormalities. IMPRESSION: Right greater than left pulmonary opacities may represent asymmetric edema, especially given the cardiomegaly and history of weight gain. A multifocal infectious process is considered less likely. Recommend clinical correlation and follow-up to complete resolution. Electronically Signed   By: Dorise Bullion III M.D.   On: 01/23/2021 15:40   EEG adult  Result Date: 01/24/2021 Lora Havens, MD     01/24/2021  6:24 PM Patient Name: [name] MRN: [id number] Epilepsy Attending: Lora Havens  Referring Physician/Provider: Dr Derrick Ravel Date: 01/24/2021 Duration: 23.17 mins Patient history: 80yo m with h/o right MCA stroke, epilepsy who presented with ams. EEG to evaluate for seizure Level of alertness: Awake AEDs during EEG study: LCM Technical aspects: This EEG study was done with scalp electrodes positioned according to the 10-20 International system of electrode placement. Electrical activity was acquired at a sampling rate of 500Hz  and reviewed with a high frequency filter of 70Hz  and a low frequency filter of 1Hz . EEG data were recorded continuously and digitally stored. Description: The posterior dominant rhythm consists of 8-9 Hz activity of moderate voltage (25-35 uV) seen predominantly in posterior head regions, symmetric and reactive to eye opening and eye closing. EEG showed continuous low amplitude 3 to 6 Hz theta-delta slowing in right frontotemporal region. Patient was noted to have right upper extremity tremor like movement  intermittently throughout the study. Concomitant eeg before, during and after the events didn't show any eeg change to suggest seizure Hyperventilation and photic stimulation were not performed.   ABNORMALITY - Continuous slow, right frontotemporal region. IMPRESSION: This study is suggestive of cortical dysfunction arising from  right frontotemporal region, nonspecific etiology but likely secondary to underlying stroke. No seizures or epileptiform discharges were seen throughout the recording. Patient was noted to have right upper extremity tremor like movement  intermittently throughout the study without concomitant eeg change. These episodes were most likely NOT epileptic. Priyanka Barbra Sarks    Assessment/Plan 1. Hypernatremia - NA 148 12/13, poor po intake at times - repeat 250 cc bolus D5W- rate 50cc/hr  - cont bmp 12/15  2. Chronic diastolic CHF (congestive heart failure) (HCC) - no recent weight fluctuation, ankle edema - cont torsemide prn -  cont daily weights  3. Prosthetic valve endocarditis, subsequent encounter - cont amoxicillin 500 mg po bid indefinitely  4. 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 Urinary retention due to benign prostatic hyperplasia - cont Flomax  7. Oropharyngeal dysphagia - pocketing crushed meds - non compliant with medicines that taste bad - will discontinue oral iron - cont DYS3 and thin liquids  8. Atrial fibrillation, chronic (HCC) - rate controlled with Tikosyn - cont eliquis for clot prevention    Family/ staff Communication: plan discussed with wife and nurse  Labs/tests ordered:   bmp 12/15

## 2021-02-18 ENCOUNTER — Non-Acute Institutional Stay (SKILLED_NURSING_FACILITY): Payer: Medicare Other | Admitting: Adult Health

## 2021-02-18 ENCOUNTER — Encounter: Payer: Self-pay | Admitting: Adult Health

## 2021-02-18 DIAGNOSIS — M6389 Disorders of muscle in diseases classified elsewhere, multiple sites: Secondary | ICD-10-CM | POA: Diagnosis not present

## 2021-02-18 DIAGNOSIS — T826XXD Infection and inflammatory reaction due to cardiac valve prosthesis, subsequent encounter: Secondary | ICD-10-CM

## 2021-02-18 DIAGNOSIS — S069X3S Unspecified intracranial injury with loss of consciousness of 1 hour to 5 hours 59 minutes, sequela: Secondary | ICD-10-CM

## 2021-02-18 DIAGNOSIS — I69354 Hemiplegia and hemiparesis following cerebral infarction affecting left non-dominant side: Secondary | ICD-10-CM | POA: Diagnosis not present

## 2021-02-18 DIAGNOSIS — N401 Enlarged prostate with lower urinary tract symptoms: Secondary | ICD-10-CM

## 2021-02-18 DIAGNOSIS — Z7189 Other specified counseling: Secondary | ICD-10-CM | POA: Diagnosis not present

## 2021-02-18 DIAGNOSIS — I69812 Visuospatial deficit and spatial neglect following other cerebrovascular disease: Secondary | ICD-10-CM | POA: Diagnosis not present

## 2021-02-18 DIAGNOSIS — I38 Endocarditis, valve unspecified: Secondary | ICD-10-CM

## 2021-02-18 DIAGNOSIS — R1312 Dysphagia, oropharyngeal phase: Secondary | ICD-10-CM | POA: Diagnosis not present

## 2021-02-18 DIAGNOSIS — S065X3S Traumatic subdural hemorrhage with loss of consciousness of 1 hour to 5 hours 59 minutes, sequela: Secondary | ICD-10-CM | POA: Diagnosis not present

## 2021-02-18 DIAGNOSIS — R338 Other retention of urine: Secondary | ICD-10-CM | POA: Diagnosis not present

## 2021-02-18 DIAGNOSIS — G9341 Metabolic encephalopathy: Secondary | ICD-10-CM | POA: Diagnosis not present

## 2021-02-18 DIAGNOSIS — R41840 Attention and concentration deficit: Secondary | ICD-10-CM | POA: Diagnosis not present

## 2021-02-18 DIAGNOSIS — R2689 Other abnormalities of gait and mobility: Secondary | ICD-10-CM | POA: Diagnosis not present

## 2021-02-18 DIAGNOSIS — I5032 Chronic diastolic (congestive) heart failure: Secondary | ICD-10-CM | POA: Diagnosis not present

## 2021-02-18 DIAGNOSIS — I69391 Dysphagia following cerebral infarction: Secondary | ICD-10-CM | POA: Diagnosis not present

## 2021-02-18 DIAGNOSIS — R293 Abnormal posture: Secondary | ICD-10-CM | POA: Diagnosis not present

## 2021-02-18 DIAGNOSIS — I6601 Occlusion and stenosis of right middle cerebral artery: Secondary | ICD-10-CM | POA: Diagnosis not present

## 2021-02-18 DIAGNOSIS — I5022 Chronic systolic (congestive) heart failure: Secondary | ICD-10-CM | POA: Diagnosis not present

## 2021-02-18 DIAGNOSIS — E870 Hyperosmolality and hypernatremia: Secondary | ICD-10-CM

## 2021-02-18 DIAGNOSIS — H538 Other visual disturbances: Secondary | ICD-10-CM | POA: Diagnosis not present

## 2021-02-18 DIAGNOSIS — R41841 Cognitive communication deficit: Secondary | ICD-10-CM | POA: Diagnosis not present

## 2021-02-18 DIAGNOSIS — R278 Other lack of coordination: Secondary | ICD-10-CM | POA: Diagnosis not present

## 2021-02-18 LAB — BASIC METABOLIC PANEL
BUN: 18 (ref 4–21)
CO2: 22 (ref 13–22)
Chloride: 114 — AB (ref 99–108)
Creatinine: 0.6 (ref 0.6–1.3)
Glucose: 118
Potassium: 3.9 (ref 3.4–5.3)
Sodium: 149 — AB (ref 137–147)

## 2021-02-18 LAB — COMPREHENSIVE METABOLIC PANEL: Calcium: 9.3 (ref 8.7–10.7)

## 2021-02-18 NOTE — Progress Notes (Signed)
Location:   [facility] Room Number: 159-A Place of Service:  SNF [phone]) Provider:  Royal Hawthorn, NP    Patient Care Team: [name] Lima, MD as PCP - General (Internal Medicine) [name] Dresser, MD as PCP - Cardiology (Cardiology) [name] Crocker, MD as Consulting Physician (Ophthalmology) [name] Lima, MD (Internal Medicine)  Extended Emergency Contact Information Primary Emergency Contact: [name] [name] Address: [place], [place] [address] [name] of [place] Phone: [phone] Relation: Spouse Secondary Emergency Contact: [name] [name], [place] [address] [name] of [place] Phone: [phone] Relation: Son  Code Status:  DNR Goals of care: Advanced Directive information Advanced Directives 02/18/2021  Does Patient Have a Medical Advance Directive? Yes  Type of Paramedic of [place];Living will;Out of facility DNR (pink MOST or yellow form)  Does patient want to make changes to medical advance directive? No - Patient declined  Copy of [place] in Chart? Yes - validated most recent copy scanned in chart (See row information)  Would patient like information on creating a medical advance directive? -  Pre-existing out of facility DNR order (yellow form or pink MOST form) -     Chief Complaint  Patient presents with   Acute Visit    Follow up on hypernatremia.     HPI:  Pt is a 80 y.o. male seen today for an acute visit for hypernatremia.  PMH includes syncope with fall in May of 2022, TBI with SDH with craniotomy, acute right MCA s/p thrombectomy, mitral and aortic valve replacement, DM II, and afib.  Recently hospitalized 12/14/20-01/04/21 for acute metabolic encephalopathy due to hypernatremia and enterococcal bacteremia with prosthetic valve endocarditis. Blood cx grew enterococcus faecalis and urine culture grew staph aureus. Repeat Blood  cx 10/17 negative. He was treated with Rocephin and ampicillin with a stop date of 01/28/21 and then he will take amoxicillin 500 mg bid indefinitely. He also had another CVA due to due to septic emboli during the hospitalization.  More recently 01/23/21-01/27/21 he was in the hospital with CHF and weight gain. He was diuresed from 184 prior to admit down to 163 lbs. He was discharged on torsemide 20 mg daily  Torsemide was held since 12/6, only the MAR prn for weight gain due to elevated sodium . Oral intake was low last week. Na 154 on 12/12. He was given 500 CC bolus. Repeat 12/13 NA 148 given 250 cc bolus of D5W.  Intakes have improved with encouragement from his wife and staff. For day shift 12/15 intake 720 out put 475.  For 12/14 intake 1685 output (not all was recorded).  Weight down 1.8 lbs from two days ago to 148.2.  he is more alert now with increased verbalization. His baseline is that he is oriented to self and able to f/c but has periods of lethargy. He is not able to walk and is not progressing with therapy.  Having BMs each day. He does not like swallowing some of the meds and will close his mouth. He is not on oxygen and is not sob. For my visit he denies any feelings of discomfort, sob, or chest pain.   CIC three times daily and to discontinue the flomax and start methenamine.  Seen by neurology and tapered off amantadine.  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    Other and unspecified  hyperlipidemia    Pre-diabetes    Previous back surgery 1978, jan 2007   Psychosexual dysfunction with inhibited sexual excitement    SOB (shortness of breath)    "with heavy exercise"   Stroke ([place]) 08/2013   "I WAS IN AFIB AND THREW A CLOT, THE EFFECTS WERE TRANSITORY AND DIDNT LAST BUT FOR 30 MINUTES"    Thoracic aortic aneurysm    Past Surgical History:  Procedure Laterality Date   AORTIC AND MITRAL VALVE REPLACEMENT     09/2004   CARDIOVERSION     3 times from 2004-2006   CARDIOVERSION N/A 09/26/2013   Procedure: CARDIOVERSION;  Surgeon: [name] Dresser, MD;  Location: [place];  Service: Cardiovascular;  Laterality: N/A;   CARDIOVERSION N/A 06/19/2014   Procedure: CARDIOVERSION;  Surgeon: Jerline Pain, MD;  Location: [place];  Service: Cardiovascular;  Laterality: N/A;   CARDIOVERSION N/A 04/24/2017   Procedure: CARDIOVERSION;  Surgeon: [name] Dresser, MD;  Location: [place];  Service: Cardiovascular;  Laterality: N/A;   CARDIOVERSION N/A 06/08/2017   Procedure: CARDIOVERSION;  Surgeon: Pixie Casino, MD;  [facility] END[facility]COPY;  Service: Cardiovascular;  Laterality: N/A;   CARDIOVERSION N/A 08/24/2017   Procedure: CARDIOVERSION;  Surgeon: Skeet Latch, MD;  Location: [facility] ENDOSCOPY;  Service: Cardiovascular;  Laterality: N/A;   CARDIOVERSION N/A 02/14/2018   Procedure: CARDIOVERSION;  Surgeon: [name] Dresser, MD;  Location: [facility] ENDOSCOPY;  Service: Cardiovascular;  Laterality: N/A;   COLONOSCOPY     CRANIOTOMY N/A 07/28/2020   Procedure: CRANIOTOMY FOR EVACUATION OF SUBDURAL HEMATOMA;  Surgeon: Eustace Olinde, MD;  Location: [place];  Service: Neurosurgery;  Laterality: N/A;   IR ANGIO VERTEBRAL SEL SUBCLAVIAN INNOMINATE UNI R MOD SED  08/07/2020   IR CT HEAD LTD  08/07/2020   IR PERCUTANEOUS ART THROMBECTOMY/INFUSION INTRACRANIAL INC DIAG ANGIO  08/07/2020   laminectomies     10/1975 and in 03/2005   RADIOLOGY WITH ANESTHESIA N/A 08/07/2020   Procedure: IR WITH  ANESTHESIA;  Surgeon: Luanne Bras, MD;  Location: [place];  Service: Radiology;  Laterality: N/A;   TEE WITHOUT CARDIOVERSION N/A 12/18/2020   Procedure: TRANSESOPHAGEAL ECHOCARDIOGRAM (TEE);  Surgeon: [name] Dresser, MD;  Location: [facility] ENDOSCOPY;  Service: Cardiovascular;  Laterality: N/A;   [place] Right 04/03/2018   Procedure: TOTAL HIP ARTHROPLASTY ANTERIOR APPROACH;  Surgeon: Paralee Cancel, MD;  Location: [facility];  Service: Orthopedics;  Laterality: Right;  70 mins    Allergies  Allergen Reactions   Naproxen Hives   No [facility] Food Allergies Other (See Comments)    Scallops - distress, nausea and vomitting    Allergies as of 02/18/2021       Reactions   Naproxen Hives   No [facility] Food Allergies Other (See Comments)   Scallops - distress, nausea and vomitting  Medication List        Accurate as of February 18, 2021  8:58 AM. If you have any questions, ask your nurse or doctor.          STOP taking these medications    ferrous sulfate 220 (44 Fe) MG/5ML solution Stopped by: Royal Hawthorn, NP       TAKE these medications    amLODipine 10 MG tablet Commonly known as: NORVASC Take 1 tablet (10 mg total) by mouth daily.   amoxicillin 400 MG/5ML suspension Commonly known as: AMOXIL Take 6.3 mLs (500 mg total) by mouth 2 (two) times daily. Please flavor.  Duration to be determined by infectious disease MD during outpatient follow-up.   apixaban 5 MG Tabs tablet Commonly known as: ELIQUIS Take 1 tablet (5 mg total) by mouth 2 (two) times daily.   atorvastatin 80 MG tablet Commonly known as: LIPITOR Take 1 tablet (80 mg total) by mouth daily.   BIOTENE MOISTURIZING MOUTH MT Use as directed 2 sprays in the mouth or throat in the morning, at noon, and at bedtime.   bisacodyl 10 MG suppository Commonly known as: DULCOLAX Place 1 suppository (10 mg total) rectally daily as needed for moderate  constipation or mild constipation.   clobetasol cream 0.05 % Commonly known as: TEMOVATE Apply 1 application topically daily as needed (for skin irritation).   diphenhydramine-acetaminophen 25-500 MG Tabs tablet Commonly known as: TYLENOL PM Take 1 tablet by mouth at bedtime as needed.   dofetilide 125 MCG capsule Commonly known as: TIKOSYN Take 3 capsules (375 mcg total) by mouth 2 (two) times daily.   famotidine 20 MG tablet Commonly known as: PEPCID Take 20 mg by mouth at bedtime as needed for indigestion.   finasteride 5 MG tablet Commonly known as: PROSCAR Take 1 tablet (5 mg total) by mouth daily.   hydrocortisone cream 1 % Apply topically 2 (two) times daily as needed for itching (rash).   lacosamide 50 MG Tabs tablet Commonly known as: VIMPAT Take 1 tablet (50 mg total) by mouth 2 (two) times daily.   levalbuterol 0.63 MG/3ML nebulizer solution Commonly known as: XOPENEX Take 0.63 mg by nebulization every 6 (six) hours as needed for wheezing or shortness of breath.   magnesium gluconate 500 MG tablet Commonly known as: MAGONATE Take 1 tablet (500 mg total) by mouth daily.   methenamine 1 g tablet Commonly known as: HIPREX Take 1 g by mouth 2 (two) times daily with a meal.   multivitamin with minerals Tabs tablet Take 1 tablet by mouth daily.   polyethylene glycol 17 g packet Commonly known as: MIRALAX / GLYCOLAX Take 17 g by mouth daily.   potassium chloride SA 20 MEQ tablet Commonly known as: KLOR-CON M Take 1 tablet (20 mEq total) by mouth daily.   senna-docusate 8.6-50 MG tablet Commonly known as: Senokot-S Take 1 tablet by mouth 2 (two) times daily.   Systane 0.4-0.3 % Soln Generic drug: Polyethyl Glycol-Propyl Glycol Place 2 drops into both eyes daily.   torsemide 20 MG tablet Commonly known as: DEMADEX Take 20 mg by mouth daily as needed.        Review of Systems  Constitutional:  Positive for activity change, fatigue and unexpected  weight change. Negative for appetite change, chills, diaphoresis and fever.  Respiratory:  Negative for cough, shortness of breath, wheezing and stridor.   Cardiovascular:  Negative for chest pain, palpitations and leg swelling.  Gastrointestinal:  Negative for abdominal distention, abdominal pain,  constipation and diarrhea.  Genitourinary:  Negative for difficulty urinating and dysuria.  Musculoskeletal:  Positive for gait problem. Negative for arthralgias, back pain, joint swelling and myalgias.  Neurological:  Positive for facial asymmetry and weakness. Negative for dizziness, seizures, syncope, speech difficulty and headaches.  Hematological:  Negative for adenopathy. Does not bruise/bleed easily.  Psychiatric/Behavioral:  Positive for confusion. Negative for agitation and behavioral problems.    Immunization History  Administered Date(s) Administered   Fluad Quad(high Dose 65+) 12/06/2018, 12/09/2020   Influenza Whole 12/25/2008, 01/05/2010, 12/30/2011   Influenza, High Dose Seasonal PF 12/14/2016   Influenza,inj,Quad PF,6+ Mos 12/19/2013   Influenza,inj,quad, With Preservative 11/22/2017   Influenza-Unspecified 12/14/2015, 12/24/2019   Moderna Covid-19 Vaccine Bivalent Booster 31yrs & up 01/12/2021   Moderna SARS-COV2 Booster Vaccination 01/02/2020, 06/26/2020   Moderna Sars-Covid-2 Vaccination 04/08/2019, 05/05/2019   Pneumococcal Conjugate-13 12/19/2013   Pneumococcal Polysaccharide-23 09/24/2007, 05/22/2017   Td 12/25/2008   Tdap 03/22/2018   Zoster Recombinat (Shingrix) 07/16/2019, 10/28/2019   Zoster, Live 09/24/2007   Pertinent  Health Maintenance Due  Topic Date Due   FOOT EXAM  Never done   URINE MICROALBUMIN  Never done   OPHTHALMOLOGY EXAM  04/07/2021 (Originally 10/04/1950)   HEMOGLOBIN A1C  06/17/2021   INFLUENZA VACCINE  Completed   COLONOSCOPY (Pts 45-30yrs Insurance coverage will need to be confirmed)  Discontinued   Fall Risk 01/25/2021 01/26/2021 01/26/2021  01/26/2021 01/27/2021  Falls in the past year? - - - - -  Was there an injury with Fall? - - - - -  Fall Risk Category Calculator - - - - -  Fall Risk Category - - - - -  Patient Fall Risk Level High fall risk High fall risk High fall risk High fall risk High fall risk  Patient at Risk for Falls Due to - - - - -  Patient at Risk for Falls Due to - - - - -  Fall risk Follow up - - - - -   Functional Status Survey:    Vitals:   02/18/21 0842  BP: 115/75  Pulse: 81  Resp: 16  Temp: 98.1 F (36.7 C)  SpO2: 97%  Weight: 150 lb (68 kg)  Height: 5\' 7"  (1.702 m)   Body mass index is 23.49 kg/m. Physical Exam Vitals and nursing note reviewed.  Constitutional:      General: He is not in acute distress.    Appearance: He is not diaphoretic.  HENT:     Head: Normocephalic and atraumatic.     Nose: Nose normal. No congestion.     Mouth/Throat:     Mouth: Mucous membranes are dry.     Pharynx: No oropharyngeal exudate.  Eyes:     Conjunctiva/sclera: Conjunctivae normal.     Pupils: Pupils are equal, round, and reactive to light.  Neck:     Thyroid: No thyromegaly.     Vascular: No JVD.     Trachea: No tracheal deviation.  Cardiovascular:     Rate and Rhythm: Normal rate and regular rhythm.     Heart sounds: No murmur heard. Pulmonary:     Effort: Pulmonary effort is normal. No respiratory distress.     Breath sounds: Normal breath sounds. No wheezing.  Abdominal:     General: Bowel sounds are normal. There is no distension.     Palpations: Abdomen is soft.     Tenderness: There is no abdominal tenderness.  Musculoskeletal:     Comments: Trace to ankles.  Lymphadenopathy:     Cervical: No cervical adenopathy.  Skin:    General: Skin is warm and dry.  Neurological:     Mental Status: He is alert.     Cranial Nerves: No cranial nerve deficit.     Comments: Oriented to self. Intermittently able to f/c.  Difficulty making eye contact and tracking. Left sided weakness and  left facial droop chronic    Labs reviewed: Recent Labs    12/18/20 1630 12/19/20 0610 12/19/20 1632 12/20/20 0153 01/25/21 8185 01/25/21 0853 01/26/21 0435 01/27/21 0538 02/01/21 0000 02/08/21 0000 02/09/21 0000 02/11/21 0000 02/15/21 0000 02/16/21 0000  NA  --  139  --    < > 143  --  143 140   < > 150*   < > 146 154* 148*  K  --  3.5  --    < > 3.6  --  3.8 3.5   < > 3.7   < > 3.4 3.6 3.7  CL  --  108  --    < > 107  --  104 107   < > 107   < > 111* 114* 112*  CO2  --  23  --    < > 27  --  26 25   < > 22   < > 25* 23* 23*  GLUCOSE  --  186*  --    < > 93  --  105* 137*  --   --   --   --   --   --   BUN  --  16  --    < > 11  --  12 15   < > 23*   < > 19 23* 22*  CREATININE  --  0.76  --    < > 0.94  --  0.95 1.06   < > 0.9   < > 0.7 0.8 0.7  CALCIUM  --  7.7*  --    < > 8.2*  --  8.7* 8.7*   < > 9.9   < > 9.4 9.7 9.8  MG 1.9 2.0 1.9   < >  --    < > 2.2 2.4  --  2.1  --   --   --   --   PHOS 3.1 2.7 2.4*  --   --   --   --   --   --   --   --   --   --   --    < > = values in this interval not displayed.   Recent Labs    01/03/21 0221 01/08/21 0000 01/20/21 1631 01/23/21 1355 02/08/21 0000  AST 32   < > 27 37 27  ALT 63*   < > 35 34 34  ALKPHOS 94   < > 115 107 207*  BILITOT 0.4  --  0.6 0.7  --   PROT 6.3*  --  6.9 6.9  --   ALBUMIN 1.9*   < > 2.5* 2.4* 3.8   < > = values in this interval not displayed.   Recent Labs    08/16/20 0427 08/17/20 0340 12/14/20 1245 12/14/20 1504 12/16/20 0130 12/18/20 0221 01/24/21 0154 01/25/21 0623 01/26/21 0435 02/01/21 0000 02/05/21 1111 02/08/21 0000 02/11/21 0000 02/16/21 0000  WBC 7.9   < > 12.0*   < > 10.7*   < > 10.0  --  10.4   < > 9.8 11.0 10.7 10.7  NEUTROABS 5.7  --  9.3*  --  7.7  --   --   --   --   --   --   --   --   --   HGB 9.0*   < > 11.7*   < > 9.9*   < > 7.3*   < > 7.9*   < > 9.8* 9.4* 9.1* 9.9*  HCT 28.2*   < > 36.8*   < > 30.7*   < > 23.3*   < > 25.9*   < > 31.8* 31* 30* 31*  MCV 98.6   < >  90.0   < > 88.0   < > 88.3  --  88.7  --  85.0  --   --   --   PLT 177   < > 119*   < > 106*   < > 395  --  417*   < > 462* 406* 335 324   < > = values in this interval not displayed.   Lab Results  Component Value Date   TSH 1.773 01/24/2021   Lab Results  Component Value Date   HGBA1C 7.3 (H) 12/17/2020   Lab Results  Component Value Date   CHOL 119 08/08/2020   HDL 42 08/08/2020   LDLCALC 56 08/08/2020   TRIG 105 08/08/2020   CHOLHDL 2.8 08/08/2020    Significant Diagnostic Results in last 30 days:  CT Head Wo Contrast  Result Date: 01/23/2021 CLINICAL DATA:  Stroke follow-up. EXAM: CT HEAD WITHOUT CONTRAST TECHNIQUE: Contiguous axial images were obtained from the base of the skull through the vertex without intravenous contrast. COMPARISON:  December 14, 2020 FINDINGS: Brain: A right MCA stroke with encephalomalacia is stable. Ventricular dilatation remains. No acute subdural, epidural, or subarachnoid hemorrhage identified. Cerebellum, brainstem, and basal cisterns are normal. White matter changes noted. No acute ischemia identified. A small posterior left parietal infarct, unchanged. No mass effect or midline shift. Vascular: Calcified atherosclerosis in the intracranial carotids. Skull: Previous right craniotomy. Sinuses/Orbits: No acute finding. Other: No other abnormalities. IMPRESSION: Chronic right MCA stroke. Chronic left posterior parietal stroke. No acute intracranial abnormality. Electronically Signed   By: Dorise Bullion III M.D.   On: 01/23/2021 15:46   MR BRAIN WO CONTRAST  Result Date: 01/24/2021 CLINICAL DATA:  [place] EXAM: MRI HEAD WITHOUT CONTRAST TECHNIQUE: Multiplanar, multiecho pulse sequences of the brain and surrounding structures were obtained without intravenous contrast. COMPARISON:  CT head dated 1 day prior, brain MRI 12/18/2020 FINDINGS: Brain: There is significant artifact obscuring large portions of the axial DWI sequence including the entire  posterior fossa. There is no evidence of acute ischemia on the coronal DWI sequence. There is no evidence of acute intracranial hemorrhage or acute extra-axial fluid collection. There is marked global parenchymal volume loss with enlargement of the ventricular system. There is an unchanged large right MCA territorial infarct with associated encephalomalacia and ex vacuo dilatation of the right lateral ventricle. An additional remote infarct in the left parietal lobe is unchanged. There is extensive FLAIR signal abnormality throughout the subcortical and periventricular white matter likely reflecting sequela of advanced chronic white matter microangiopathy. There is a thin subdural collection overlying the right cerebral hemisphere underlying a craniotomy site which may reflect dural thickening or a tiny chronic subdural hematoma. There is SWI signal dropout in the right frontal lobe consistent with interval evolution of the previously seen intraparenchymal hematoma on the MRI of 12/18/2020. There are a few additional scattered  punctate chronic microhemorrhages, nonspecific. There is no solid mass lesion.  There is no midline shift. Vascular: Normal flow voids. Skull and upper cervical spine: Postsurgical changes reflecting right frontal parietal craniotomy are again seen. There is no suspicious marrow signal abnormality. Sinuses/Orbits: The paranasal sinuses are clear. Bilateral lens implants are in place. The globes and orbits are otherwise unremarkable. Other: None. IMPRESSION: 1. No evidence of acute intracranial pathology. 2. Interval expected evolution of the small intraparenchymal hematoma in the right frontal lobe since 12/18/2020. 3. Unchanged remote MCA territorial infarct and small remote infarct in the left parietal lobe. 4. 3 mm thick extra-axial signal abnormality overlying the right cerebral hemisphere deep to the craniotomy site may reflect a small chronic subdural hematoma or dural thickening. The  appearance is not significantly changed since 12/18/2020. 5. Unchanged advanced global parenchymal volume loss and chronic white matter microangiopathy. Electronically Signed   By: Valetta Mole M.D.   On: 01/24/2021 16:53   DG Chest Port 1 View  Result Date: 01/23/2021 CLINICAL DATA:  Weight gain.  Shortness of breath. EXAM: PORTABLE CHEST 1 VIEW COMPARISON:  December 31, 2020 FINDINGS: Stable cardiomegaly. Stable hila and mediastinum. A right PICC line terminating central SVC. No pneumothorax. Bilateral pulmonary opacities, right greater than left. No other acute abnormalities. IMPRESSION: Right greater than left pulmonary opacities may represent asymmetric edema, especially given the cardiomegaly and history of weight gain. A multifocal infectious process is considered less likely. Recommend clinical correlation and follow-up to complete resolution. Electronically Signed   By: Dorise Bullion III M.D.   On: 01/23/2021 15:40   EEG adult  Result Date: 01/24/2021 Lora Havens, MD     01/24/2021  6:24 PM Patient Name: [name] [name] MRN: [id number] Epilepsy Attending: Lora Havens Referring Physician/Provider: Dr Derrick Ravel Date: 01/24/2021 Duration: 23.17 mins Patient history: 80yo m with h/o right MCA stroke, epilepsy who presented with ams. EEG to evaluate for seizure Level of alertness: Awake AEDs during EEG study: LCM Technical aspects: This EEG study was done with scalp electrodes positioned according to the 10-20 International system of electrode placement. Electrical activity was acquired at a sampling rate of 500Hz  and reviewed with a high frequency filter of 70Hz  and a low frequency filter of 1Hz . EEG data were recorded continuously and digitally stored. Description: The posterior dominant rhythm consists of 8-9 Hz activity of moderate voltage (25-35 uV) seen predominantly in posterior head regions, symmetric and reactive to eye opening and eye closing. EEG showed continuous low  amplitude 3 to 6 Hz theta-delta slowing in right frontotemporal region. Patient was noted to have right upper extremity tremor like movement  intermittently throughout the study. Concomitant eeg before, during and after the events didn't show any eeg change to suggest seizure Hyperventilation and photic stimulation were not performed.   ABNORMALITY - Continuous slow, right frontotemporal region. IMPRESSION: This study is suggestive of cortical dysfunction arising from  right frontotemporal region, nonspecific etiology but likely secondary to underlying stroke. No seizures or epileptiform discharges were seen throughout the recording. Patient was noted to have right upper extremity tremor like movement  intermittently throughout the study without concomitant eeg change. These episodes were most likely NOT epileptic. Priyanka Barbra Sarks    Assessment/Plan  1. Hypernatremia NA increase by 1 pt to 149 despite increased fluids and 1 bolus of 250 IV expiring and pt is drinking more Will given D5W 50 cc/hr x 500cc then repeat BMP running slowly due to resident hx of CHF  2. 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  Followed by neurology   4. Oropharyngeal dysphagia Continue D3 diet thin liq Followed by ST  5. Urinary retention due to benign prostatic hyperplasia Followed by urology  CIC q shift.  Proscar qd  6. Prosthetic valve endocarditis, subsequent encounter Completed 6 weeks of IV antibiotics Continues on amoxicillin indefinitely Followed by ID  7. Advanced care planning/counseling discussion I discussed with Mr. Inabinet with his wife present that he has not had adequate intake which leads to hypernatremia and lethargy. It remains  difficult to manage as he has CHF as well. I described a feeding tube for water and meds to him and he states "I don't think I would want that".  He is drinking more at this time but remains with a reduced QOL .  His wife will be meeting with Dr. Lyndel Safe on Monday  Spent 16 min in counseling and discussion with husband alone at first and then later his wife.   Family/ staff Communication: discussed with his wife Herbert Pun.   Labs/tests ordered:  BMP in am

## 2021-02-19 DIAGNOSIS — I69354 Hemiplegia and hemiparesis following cerebral infarction affecting left non-dominant side: Secondary | ICD-10-CM | POA: Diagnosis not present

## 2021-02-19 DIAGNOSIS — R293 Abnormal posture: Secondary | ICD-10-CM | POA: Diagnosis not present

## 2021-02-19 DIAGNOSIS — H538 Other visual disturbances: Secondary | ICD-10-CM | POA: Diagnosis not present

## 2021-02-19 DIAGNOSIS — I69391 Dysphagia following cerebral infarction: Secondary | ICD-10-CM | POA: Diagnosis not present

## 2021-02-19 DIAGNOSIS — I69812 Visuospatial deficit and spatial neglect following other cerebrovascular disease: Secondary | ICD-10-CM | POA: Diagnosis not present

## 2021-02-19 DIAGNOSIS — I5022 Chronic systolic (congestive) heart failure: Secondary | ICD-10-CM | POA: Diagnosis not present

## 2021-02-19 DIAGNOSIS — I6601 Occlusion and stenosis of right middle cerebral artery: Secondary | ICD-10-CM | POA: Diagnosis not present

## 2021-02-19 DIAGNOSIS — G9341 Metabolic encephalopathy: Secondary | ICD-10-CM | POA: Diagnosis not present

## 2021-02-19 DIAGNOSIS — S065X3S Traumatic subdural hemorrhage with loss of consciousness of 1 hour to 5 hours 59 minutes, sequela: Secondary | ICD-10-CM | POA: Diagnosis not present

## 2021-02-19 DIAGNOSIS — R41840 Attention and concentration deficit: Secondary | ICD-10-CM | POA: Diagnosis not present

## 2021-02-19 DIAGNOSIS — R278 Other lack of coordination: Secondary | ICD-10-CM | POA: Diagnosis not present

## 2021-02-19 DIAGNOSIS — M6389 Disorders of muscle in diseases classified elsewhere, multiple sites: Secondary | ICD-10-CM | POA: Diagnosis not present

## 2021-02-19 DIAGNOSIS — E870 Hyperosmolality and hypernatremia: Secondary | ICD-10-CM | POA: Diagnosis not present

## 2021-02-19 DIAGNOSIS — I5032 Chronic diastolic (congestive) heart failure: Secondary | ICD-10-CM | POA: Diagnosis not present

## 2021-02-19 DIAGNOSIS — R41841 Cognitive communication deficit: Secondary | ICD-10-CM | POA: Diagnosis not present

## 2021-02-19 LAB — BASIC METABOLIC PANEL
BUN: 18 (ref 4–21)
CO2: 23 — AB (ref 13–22)
Chloride: 113 — AB (ref 99–108)
Creatinine: 0.6 (ref 0.6–1.3)
Glucose: 107
Potassium: 3.6 (ref 3.4–5.3)
Sodium: 146 (ref 137–147)

## 2021-02-19 LAB — COMPREHENSIVE METABOLIC PANEL: Calcium: 9 (ref 8.7–10.7)

## 2021-02-22 ENCOUNTER — Encounter: Payer: Self-pay | Admitting: Internal Medicine

## 2021-02-22 ENCOUNTER — Non-Acute Institutional Stay (SKILLED_NURSING_FACILITY): Payer: Medicare Other | Admitting: Internal Medicine

## 2021-02-22 DIAGNOSIS — I6601 Occlusion and stenosis of right middle cerebral artery: Secondary | ICD-10-CM | POA: Diagnosis not present

## 2021-02-22 DIAGNOSIS — I330 Acute and subacute infective endocarditis: Secondary | ICD-10-CM | POA: Diagnosis not present

## 2021-02-22 DIAGNOSIS — R293 Abnormal posture: Secondary | ICD-10-CM | POA: Diagnosis not present

## 2021-02-22 DIAGNOSIS — I69812 Visuospatial deficit and spatial neglect following other cerebrovascular disease: Secondary | ICD-10-CM | POA: Diagnosis not present

## 2021-02-22 DIAGNOSIS — I10 Essential (primary) hypertension: Secondary | ICD-10-CM | POA: Diagnosis not present

## 2021-02-22 DIAGNOSIS — H538 Other visual disturbances: Secondary | ICD-10-CM | POA: Diagnosis not present

## 2021-02-22 DIAGNOSIS — M6389 Disorders of muscle in diseases classified elsewhere, multiple sites: Secondary | ICD-10-CM | POA: Diagnosis not present

## 2021-02-22 DIAGNOSIS — S069X3S Unspecified intracranial injury with loss of consciousness of 1 hour to 5 hours 59 minutes, sequela: Secondary | ICD-10-CM | POA: Diagnosis not present

## 2021-02-22 DIAGNOSIS — T826XXD Infection and inflammatory reaction due to cardiac valve prosthesis, subsequent encounter: Secondary | ICD-10-CM

## 2021-02-22 DIAGNOSIS — R1312 Dysphagia, oropharyngeal phase: Secondary | ICD-10-CM

## 2021-02-22 DIAGNOSIS — I38 Endocarditis, valve unspecified: Secondary | ICD-10-CM

## 2021-02-22 DIAGNOSIS — S065X3S Traumatic subdural hemorrhage with loss of consciousness of 1 hour to 5 hours 59 minutes, sequela: Secondary | ICD-10-CM | POA: Diagnosis not present

## 2021-02-22 DIAGNOSIS — R278 Other lack of coordination: Secondary | ICD-10-CM | POA: Diagnosis not present

## 2021-02-22 DIAGNOSIS — E870 Hyperosmolality and hypernatremia: Secondary | ICD-10-CM

## 2021-02-22 DIAGNOSIS — I69354 Hemiplegia and hemiparesis following cerebral infarction affecting left non-dominant side: Secondary | ICD-10-CM | POA: Diagnosis not present

## 2021-02-22 DIAGNOSIS — G9341 Metabolic encephalopathy: Secondary | ICD-10-CM | POA: Diagnosis not present

## 2021-02-22 DIAGNOSIS — I69391 Dysphagia following cerebral infarction: Secondary | ICD-10-CM | POA: Diagnosis not present

## 2021-02-22 DIAGNOSIS — I5022 Chronic systolic (congestive) heart failure: Secondary | ICD-10-CM | POA: Diagnosis not present

## 2021-02-22 DIAGNOSIS — R41841 Cognitive communication deficit: Secondary | ICD-10-CM | POA: Diagnosis not present

## 2021-02-22 DIAGNOSIS — I5032 Chronic diastolic (congestive) heart failure: Secondary | ICD-10-CM

## 2021-02-22 DIAGNOSIS — R41840 Attention and concentration deficit: Secondary | ICD-10-CM | POA: Diagnosis not present

## 2021-02-22 LAB — HEPATIC FUNCTION PANEL
ALT: 36 (ref 10–40)
AST: 33 (ref 14–40)
Alkaline Phosphatase: 226 — AB (ref 25–125)
Bilirubin, Total: 0.2

## 2021-02-22 LAB — CBC AND DIFFERENTIAL
HCT: 31 — AB (ref 41–53)
Hemoglobin: 9.5 — AB (ref 13.5–17.5)
Platelets: 272 (ref 150–399)
WBC: 9.6

## 2021-02-22 LAB — BASIC METABOLIC PANEL
BUN: 17 (ref 4–21)
CO2: 22 (ref 13–22)
Chloride: 115 — AB (ref 99–108)
Creatinine: 0.7 (ref 0.6–1.3)
Glucose: 119
Potassium: 3.9 (ref 3.4–5.3)
Sodium: 149 — AB (ref 137–147)

## 2021-02-22 LAB — COMPREHENSIVE METABOLIC PANEL
Albumin: 3.3 — AB (ref 3.5–5.0)
Calcium: 9.1 (ref 8.7–10.7)
Globulin: 3.5

## 2021-02-22 LAB — CBC: RBC: 3.75 — AB (ref 3.87–5.11)

## 2021-02-22 LAB — POCT ERYTHROCYTE SEDIMENTATION RATE, NON-AUTOMATED: Sed Rate: 79

## 2021-02-22 NOTE — Progress Notes (Signed)
Location:   [facility] Room Number: [place] of Service:  SNF [phone]) Provider:  Veleta Miners MD  [name], MD  Patient Care Team: [name], MD as PCP - General (Internal Medicine) Larey Dresser, MD as PCP - Cardiology (Cardiology) [name], MD as Consulting Physician (Ophthalmology) [name], MD (Internal Medicine)  Extended Emergency Contact Information Primary Emergency Contact: [name] Address: [place], [place] [address] [name] of [place] Phone: [phone] Relation: Spouse Secondary Emergency Contact: [name], [place] [address] [name] of [place] Phone: [phone] Relation: Son  Code Status:  DNR Goals of care: Advanced Directive information Advanced Directives 02/22/2021  Does Patient Have a Medical Advance Directive? Yes  Type of Paramedic of [place];Living will;Out of facility DNR (pink MOST or yellow form)  Does patient want to make changes to medical advance directive? No - Patient declined  Copy of [place] in Chart? Yes - validated most recent copy scanned in chart (See row information)  Would patient like information on creating a medical advance directive? -  Pre-existing out of facility DNR order (yellow form or pink MOST form) -     Chief Complaint  Patient presents with   Acute Visit    Family meeting     HPI:  Pt is a 80 y.o. 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 intake getting lethargic.  Holding food and meds. He does respond to IV fluids. When I saw him he was still sleepy his friend was trying to feed him but he would not open his mouth.  The nurses said that they were able to give his medications to him today Meeting was today to see if he if family wants to consider  PEG tube Therapy also is planning to discharge him because he is not making much improvement.  Does not follow much commands. 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    Other and unspecified hyperlipidemia    Pre-diabetes    Previous back surgery 1978, jan 2007   Psychosexual dysfunction with inhibited sexual excitement    SOB (  shortness of breath)    "with heavy exercise"   Stroke ([place]) 08/2013   "I WAS IN AFIB AND THREW A CLOT, THE EFFECTS WERE TRANSITORY AND DIDNT LAST BUT FOR 30 MINUTES"    Thoracic aortic aneurysm    Past Surgical History:  Procedure Laterality Date   AORTIC AND MITRAL VALVE REPLACEMENT     09/2004   CARDIOVERSION     3 times from 2004-2006   CARDIOVERSION N/A 09/26/2013   Procedure: CARDIOVERSION;  Surgeon: Larey Dresser, MD;  Location: [facility];  Service: Cardiovascular;  Laterality: N/A;    CARDIOVERSION N/A 06/19/2014   Procedure: CARDIOVERSION;  Surgeon: Jerline Pain, MD;  Location: [facility];  Service: Cardiovascular;  Laterality: N/A;   CARDIOVERSION N/A 04/24/2017   Procedure: CARDIOVERSION;  Surgeon: Larey Dresser, MD;  Location: [facility];  Service: Cardiovascular;  Laterality: N/A;   CARDIOVERSION N/A 06/08/2017   Procedure: CARDIOVERSION;  Surgeon: Pixie Casino, MD;  Location: [facility] ENDOSCOPY;  Service: Cardiovascular;  Laterality: N/A;   CARDIOVERSION N/A 08/24/2017   Procedure: CARDIOVERSION;  Surgeon: Skeet Latch, MD;  Location: [facility] ENDOSCOPY;  Service: Cardiovascular;  Laterality: N/A;   CARDIOVERSION N/A 02/14/2018   Procedure: CARDIOVERSION;  Surgeon: Larey Dresser, MD;  Location: [facility] ENDOSCOPY;  Service: Cardiovascular;  Laterality: N/A;   COLONOSCOPY     CRANIOTOMY N/A 07/28/2020   Procedure: CRANIOTOMY FOR EVACUATION OF SUBDURAL HEMATOMA;  Surgeon: Eustace Castanon, MD;  Location: [facility];  Service: Neurosurgery;  Laterality: N/A;   IR ANGIO VERTEBRAL SEL SUBCLAVIAN INNOMINATE UNI R MOD SED  08/07/2020   IR CT HEAD LTD  08/07/2020   IR PERCUTANEOUS ART THROMBECTOMY/INFUSION INTRACRANIAL INC DIAG ANGIO  08/07/2020   laminectomies     10/1975 and in 03/2005   RADIOLOGY WITH ANESTHESIA N/A 08/07/2020   Procedure: IR WITH ANESTHESIA;  Surgeon: Luanne Bras, MD;  Location: [facility];  Service: Radiology;  Laterality: N/A;   TEE WITHOUT CARDIOVERSION N/A 12/18/2020   Procedure: TRANSESOPHAGEAL ECHOCARDIOGRAM (TEE);  Surgeon: Larey Dresser, MD;  Location: [facility] ENDOSCOPY;  Service: Cardiovascular;  Laterality: N/A;   [place] Right 04/03/2018   Procedure: TOTAL HIP ARTHROPLASTY ANTERIOR APPROACH;  Surgeon: Paralee Cancel, MD;  Location: [facility];  Service: Orthopedics;  Laterality: Right;  70 mins    Allergies  Allergen Reactions   Naproxen Hives   No [facility] Food Allergies Other (See Comments)    Scallops -  distress, nausea and vomitting    Allergies as of 02/22/2021       Reactions   Naproxen Hives   No [facility] Food Allergies Other (See Comments)   Scallops - distress, nausea and vomitting        Medication List        Accurate as of February 22, 2021  2:26 PM. If you have any questions, ask your nurse or doctor.          amLODipine 10 MG tablet Commonly known as: NORVASC Take 1 tablet (10 mg total) by mouth daily.   amoxicillin 400 MG/5ML suspension Commonly known as: AMOXIL Take 6.3 mLs (500 mg total) by mouth 2 (two) times daily. Please flavor.  Duration to be determined by infectious disease MD during outpatient follow-up.   apixaban 5 MG Tabs tablet Commonly known as: ELIQUIS Take 1 tablet (5 mg total) by mouth 2 (two) times daily.   atorvastatin 80 MG tablet Commonly known as: LIPITOR Take 1 tablet (  80 mg total) by mouth daily.   BIOTENE MOISTURIZING MOUTH MT Use as directed 2 sprays in the mouth or throat in the morning, at noon, and at bedtime.   bisacodyl 10 MG suppository Commonly known as: DULCOLAX Place 1 suppository (10 mg total) rectally daily as needed for moderate constipation or mild constipation.   clobetasol cream 0.05 % Commonly known as: TEMOVATE Apply 1 application topically daily as needed (for skin irritation).   diphenhydramine-acetaminophen 25-500 MG Tabs tablet Commonly known as: TYLENOL PM Take 1 tablet by mouth at bedtime as needed.   dofetilide 125 MCG capsule Commonly known as: TIKOSYN Take 3 capsules (375 mcg total) by mouth 2 (two) times daily.   famotidine 20 MG tablet Commonly known as: PEPCID Take 20 mg by mouth at bedtime as needed for indigestion.   finasteride 5 MG tablet Commonly known as: PROSCAR Take 1 tablet (5 mg total) by mouth daily.   hydrocortisone cream 1 % Apply topically 2 (two) times daily as needed for itching (rash).   lacosamide 50 MG Tabs tablet Commonly known as: VIMPAT Take 1 tablet (50  mg total) by mouth 2 (two) times daily.   levalbuterol 0.63 MG/3ML nebulizer solution Commonly known as: XOPENEX Take 0.63 mg by nebulization every 6 (six) hours as needed for wheezing or shortness of breath.   magnesium gluconate 500 MG tablet Commonly known as: MAGONATE Take 1 tablet (500 mg total) by mouth daily.   methenamine 1 g tablet Commonly known as: HIPREX Take 1 g by mouth 2 (two) times daily with a meal.   multivitamin with minerals Tabs tablet Take 1 tablet by mouth daily.   polyethylene glycol 17 g packet Commonly known as: MIRALAX / GLYCOLAX Take 17 g by mouth daily.   potassium chloride SA 20 MEQ tablet Commonly known as: KLOR-CON M Take 1 tablet (20 mEq total) by mouth daily.   senna-docusate 8.6-50 MG tablet Commonly known as: Senokot-S Take 1 tablet by mouth 2 (two) times daily.   Systane 0.4-0.3 % Soln Generic drug: Polyethyl Glycol-Propyl Glycol Place 2 drops into both eyes daily.   torsemide 20 MG tablet Commonly known as: DEMADEX Take 20 mg by mouth daily as needed.        Review of Systems  Unable to perform ROS: Other   Immunization History  Administered Date(s) Administered   Fluad Quad(high Dose 65+) 12/06/2018, 12/09/2020   Influenza Whole 12/25/2008, 01/05/2010, 12/30/2011   Influenza, High Dose Seasonal PF 12/14/2016   Influenza,inj,Quad PF,6+ Mos 12/19/2013   Influenza,inj,quad, With Preservative 11/22/2017   Influenza-Unspecified 12/14/2015, 12/24/2019   Moderna Covid-19 Vaccine Bivalent Booster 54yrs & up 01/12/2021   Moderna SARS-COV2 Booster Vaccination 01/02/2020, 06/26/2020   Moderna Sars-Covid-2 Vaccination 04/08/2019, 05/05/2019   Pneumococcal Conjugate-13 12/19/2013   Pneumococcal Polysaccharide-23 09/24/2007, 05/22/2017   Td 12/25/2008   Tdap 03/22/2018   Zoster Recombinat (Shingrix) 07/16/2019, 10/28/2019   Zoster, Live 09/24/2007   Pertinent  Health Maintenance Due  Topic Date Due   FOOT EXAM  Never done    URINE MICROALBUMIN  Never done   OPHTHALMOLOGY EXAM  04/07/2021 (Originally 10/04/1950)   HEMOGLOBIN A1C  06/17/2021   INFLUENZA VACCINE  Completed   COLONOSCOPY (Pts 45-76yrs Insurance coverage will need to be confirmed)  Discontinued   Fall Risk 01/25/2021 01/26/2021 01/26/2021 01/26/2021 01/27/2021  Falls in the past year? - - - - -  Was there an injury with Fall? - - - - -  Fall Risk Category Calculator - - - - -  Fall Risk Category - - - - -  Patient Fall Risk Level High fall risk High fall risk High fall risk High fall risk High fall risk  Patient at Risk for Falls Due to - - - - -  Patient at Risk for Falls Due to - - - - -  Fall risk Follow up - - - - -   Functional Status Survey:    Vitals:   02/22/21 1119  BP: 104/65  Pulse: 85  Resp: 18  Temp: 99 F (37.2 C)  SpO2: 90%  Weight: 154 lb 3.2 oz (69.9 kg)   Body mass index is 24.15 kg/m. Physical Exam Vitals reviewed.  Constitutional:      Comments: Looks Sleepy  HENT:     Head: Normocephalic.     Mouth/Throat:     Mouth: Mucous membranes are moist.     Pharynx: Oropharynx is clear.  Eyes:     Pupils: Pupils are equal, round, and reactive to light.  Cardiovascular:     Rate and Rhythm: Normal rate and regular rhythm.     Pulses: Normal pulses.     Heart sounds: Murmur heard.  Pulmonary:     Effort: Pulmonary effort is normal. No respiratory distress.     Breath sounds: Normal breath sounds. No rales.  Abdominal:     General: Abdomen is flat. Bowel sounds are normal.     Palpations: Abdomen is soft.  Musculoskeletal:        General: No swelling.     Cervical back: Neck supple.  Skin:    General: Skin is warm.  Neurological:     Comments: Would not respond. Would not follow any commands Has left Hemiparesis   Psychiatric:        Mood and Affect: Mood normal.        Thought Content: Thought content normal.    Labs reviewed: Recent Labs    12/18/20 1630 12/19/20 0610 12/19/20 1632  12/20/20 0153 01/25/21 9518 01/25/21 0853 01/26/21 0435 01/27/21 0538 02/01/21 0000 02/08/21 0000 02/09/21 0000 02/11/21 0000 02/15/21 0000 02/16/21 0000  NA  --  139  --    < > 143  --  143 140   < > 150*   < > 146 154* 148*  K  --  3.5  --    < > 3.6  --  3.8 3.5   < > 3.7   < > 3.4 3.6 3.7  CL  --  108  --    < > 107  --  104 107   < > 107   < > 111* 114* 112*  CO2  --  23  --    < > 27  --  26 25   < > 22   < > 25* 23* 23*  GLUCOSE  --  186*  --    < > 93  --  105* 137*  --   --   --   --   --   --   BUN  --  16  --    < > 11  --  12 15   < > 23*   < > 19 23* 22*  CREATININE  --  0.76  --    < > 0.94  --  0.95 1.06   < > 0.9   < > 0.7 0.8 0.7  CALCIUM  --  7.7*  --    < > 8.2*  --  8.7*  8.7*   < > 9.9   < > 9.4 9.7 9.8  MG 1.9 2.0 1.9   < >  --    < > 2.2 2.4  --  2.1  --   --   --   --   PHOS 3.1 2.7 2.4*  --   --   --   --   --   --   --   --   --   --   --    < > = values in this interval not displayed.   Recent Labs    01/03/21 0221 01/08/21 0000 01/20/21 1631 01/23/21 1355 02/08/21 0000  AST 32   < > 27 37 27  ALT 63*   < > 35 34 34  ALKPHOS 94   < > 115 107 207*  BILITOT 0.4  --  0.6 0.7  --   PROT 6.3*  --  6.9 6.9  --   ALBUMIN 1.9*   < > 2.5* 2.4* 3.8   < > = values in this interval not displayed.   Recent Labs    08/16/20 0427 08/17/20 0340 12/14/20 1245 12/14/20 1504 12/16/20 0130 12/18/20 0221 01/24/21 0154 01/25/21 0623 01/26/21 0435 02/01/21 0000 02/05/21 1111 02/08/21 0000 02/11/21 0000 02/16/21 0000  WBC 7.9   < > 12.0*   < > 10.7*   < > 10.0  --  10.4   < > 9.8 11.0 10.7 10.7  NEUTROABS 5.7  --  9.3*  --  7.7  --   --   --   --   --   --   --   --   --   HGB 9.0*   < > 11.7*   < > 9.9*   < > 7.3*   < > 7.9*   < > 9.8* 9.4* 9.1* 9.9*  HCT 28.2*   < > 36.8*   < > 30.7*   < > 23.3*   < > 25.9*   < > 31.8* 31* 30* 31*  MCV 98.6   < > 90.0   < > 88.0   < > 88.3  --  88.7  --  85.0  --   --   --   PLT 177   < > 119*   < > 106*   < > 395  --   417*   < > 462* 406* 335 324   < > = values in this interval not displayed.   Lab Results  Component Value Date   TSH 1.773 01/24/2021   Lab Results  Component Value Date   HGBA1C 7.3 (H) 12/17/2020   Lab Results  Component Value Date   CHOL 119 08/08/2020   HDL 42 08/08/2020   LDLCALC 56 08/08/2020   TRIG 105 08/08/2020   CHOLHDL 2.8 08/08/2020    Significant Diagnostic Results in last 30 days:  CT Head Wo Contrast  Result Date: 01/23/2021 CLINICAL DATA:  Stroke follow-up. EXAM: CT HEAD WITHOUT CONTRAST TECHNIQUE: Contiguous axial images were obtained from the base of the skull through the vertex without intravenous contrast. COMPARISON:  December 14, 2020 FINDINGS: Brain: A right MCA stroke with encephalomalacia is stable. Ventricular dilatation remains. No acute subdural, epidural, or subarachnoid hemorrhage identified. Cerebellum, brainstem, and basal cisterns are normal. White matter changes noted. No acute ischemia identified. A small posterior left parietal infarct, unchanged. No mass effect or midline shift. Vascular: Calcified atherosclerosis in the intracranial carotids. Skull: Previous right craniotomy.  Sinuses/Orbits: No acute finding. Other: No other abnormalities. IMPRESSION: Chronic right MCA stroke. Chronic left posterior parietal stroke. No acute intracranial abnormality. Electronically Signed   By: Dorise Bullion III M.D.   On: 01/23/2021 15:46   MR BRAIN WO CONTRAST  Result Date: 01/24/2021 CLINICAL DATA:  [place]ium EXAM: MRI HEAD WITHOUT CONTRAST TECHNIQUE: Multiplanar, multiecho pulse sequences of the brain and surrounding structures were obtained without intravenous contrast. COMPARISON:  CT head dated 1 day prior, brain MRI 12/18/2020 FINDINGS: Brain: There is significant artifact obscuring large portions of the axial DWI sequence including the entire posterior fossa. There is no evidence of acute ischemia on the coronal DWI sequence. There is no evidence of acute  intracranial hemorrhage or acute extra-axial fluid collection. There is marked global parenchymal volume loss with enlargement of the ventricular system. There is an unchanged large right MCA territorial infarct with associated encephalomalacia and ex vacuo dilatation of the right lateral ventricle. An additional remote infarct in the left parietal lobe is unchanged. There is extensive FLAIR signal abnormality throughout the subcortical and periventricular white matter likely reflecting sequela of advanced chronic white matter microangiopathy. There is a thin subdural collection overlying the right cerebral hemisphere underlying a craniotomy site which may reflect dural thickening or a tiny chronic subdural hematoma. There is SWI signal dropout in the right frontal lobe consistent with interval evolution of the previously seen intraparenchymal hematoma on the MRI of 12/18/2020. There are a few additional scattered punctate chronic microhemorrhages, nonspecific. There is no solid mass lesion.  There is no midline shift. Vascular: Normal flow voids. Skull and upper cervical spine: Postsurgical changes reflecting right frontal parietal craniotomy are again seen. There is no suspicious marrow signal abnormality. Sinuses/Orbits: The paranasal sinuses are clear. Bilateral lens implants are in place. The globes and orbits are otherwise unremarkable. Other: None. IMPRESSION: 1. No evidence of acute intracranial pathology. 2. Interval expected evolution of the small intraparenchymal hematoma in the right frontal lobe since 12/18/2020. 3. Unchanged remote MCA territorial infarct and small remote infarct in the left parietal lobe. 4. 3 mm thick extra-axial signal abnormality overlying the right cerebral hemisphere deep to the craniotomy site may reflect a small chronic subdural hematoma or dural thickening. The appearance is not significantly changed since 12/18/2020. 5. Unchanged advanced global parenchymal volume loss and  chronic white matter microangiopathy. Electronically Signed   By: Valetta Mole M.D.   On: 01/24/2021 16:53   EEG adult  Result Date: 01/24/2021 Lora Havens, MD     01/24/2021  6:24 PM Patient Name: AKSH SWART MRN: [id number] Epilepsy Attending: Lora Havens Referring Physician/Provider: Dr Derrick Ravel Date: 01/24/2021 Duration: 23.17 mins Patient history: 80yo m with h/o right MCA stroke, epilepsy who presented with ams. EEG to evaluate for seizure Level of alertness: Awake AEDs during EEG study: LCM Technical aspects: This EEG study was done with scalp electrodes positioned according to the 10-20 International system of electrode placement. Electrical activity was acquired at a sampling rate of $Remov'500Hz'kYCzUd$  and reviewed with a high frequency filter of $RemoveB'70Hz'myONSvzW$  and a low frequency filter of $RemoveB'1Hz'SPPgZpmS$ . EEG data were recorded continuously and digitally stored. Description: The posterior dominant rhythm consists of 8-9 Hz activity of moderate voltage (25-35 uV) seen predominantly in posterior head regions, symmetric and reactive to eye opening and eye closing. EEG showed continuous low amplitude 3 to 6 Hz theta-delta slowing in right frontotemporal region. Patient was noted to have right upper extremity tremor like movement  intermittently throughout the  study. Concomitant eeg before, during and after the events didn't show any eeg change to suggest seizure Hyperventilation and photic stimulation were not performed.   ABNORMALITY - Continuous slow, right frontotemporal region. IMPRESSION: This study is suggestive of cortical dysfunction arising from  right frontotemporal region, nonspecific etiology but likely secondary to underlying stroke. No seizures or epileptiform discharges were seen throughout the recording. Patient was noted to have right upper extremity tremor like movement  intermittently throughout the study without concomitant eeg change. These episodes were most likely NOT epileptic. Priyanka Barbra Sarks    Assessment/Plan Hypernatremia Repeat Sodium Pending Addendum Sodium 149 today Will give 250 cc of IV D5W again Last sodium was 146 Patient not able to keep Oral intake Family wanted to talk about his option including Peg Tube. They want to wait till Christmas before giving final answers. 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  Oropharyngeal dysphagia D 3 with Aspiration precautions  Prosthetic valve endocarditis, subsequent encounter On Amoxicillin Indefinite  Chronic diastolic CHF (congestive heart failure) (HCC) Demadex on hold due to Poor intake and Hypernatremia Trying to follow his Weight closely   Urinary retention due to benign prostatic hyperplasia Needs PRN Cath on Proscar Also on Hiprex per urology Atrial fibrillation, chronic ([facility]CC) On Eliquis and Tikosyn Hyperlipidemia, unspecified hyperlipidemia type On statin Essential hypertension Continue Norvasc BP on Lower side Will watch it fo rnow Anemia On Iron   Family/ staff Communication:   Labs/tests ordered:   CMP

## 2021-02-23 DIAGNOSIS — R278 Other lack of coordination: Secondary | ICD-10-CM | POA: Diagnosis not present

## 2021-02-23 DIAGNOSIS — I6601 Occlusion and stenosis of right middle cerebral artery: Secondary | ICD-10-CM | POA: Diagnosis not present

## 2021-02-23 DIAGNOSIS — I5032 Chronic diastolic (congestive) heart failure: Secondary | ICD-10-CM | POA: Diagnosis not present

## 2021-02-23 DIAGNOSIS — I5022 Chronic systolic (congestive) heart failure: Secondary | ICD-10-CM | POA: Diagnosis not present

## 2021-02-23 DIAGNOSIS — S065X3S Traumatic subdural hemorrhage with loss of consciousness of 1 hour to 5 hours 59 minutes, sequela: Secondary | ICD-10-CM | POA: Diagnosis not present

## 2021-02-23 DIAGNOSIS — R41841 Cognitive communication deficit: Secondary | ICD-10-CM | POA: Diagnosis not present

## 2021-02-23 DIAGNOSIS — R41840 Attention and concentration deficit: Secondary | ICD-10-CM | POA: Diagnosis not present

## 2021-02-23 DIAGNOSIS — I69391 Dysphagia following cerebral infarction: Secondary | ICD-10-CM | POA: Diagnosis not present

## 2021-02-23 DIAGNOSIS — I69812 Visuospatial deficit and spatial neglect following other cerebrovascular disease: Secondary | ICD-10-CM | POA: Diagnosis not present

## 2021-02-23 DIAGNOSIS — M6389 Disorders of muscle in diseases classified elsewhere, multiple sites: Secondary | ICD-10-CM | POA: Diagnosis not present

## 2021-02-23 DIAGNOSIS — R2689 Other abnormalities of gait and mobility: Secondary | ICD-10-CM | POA: Diagnosis not present

## 2021-02-23 DIAGNOSIS — H538 Other visual disturbances: Secondary | ICD-10-CM | POA: Diagnosis not present

## 2021-02-23 DIAGNOSIS — G9341 Metabolic encephalopathy: Secondary | ICD-10-CM | POA: Diagnosis not present

## 2021-02-23 DIAGNOSIS — I69354 Hemiplegia and hemiparesis following cerebral infarction affecting left non-dominant side: Secondary | ICD-10-CM | POA: Diagnosis not present

## 2021-02-23 DIAGNOSIS — R293 Abnormal posture: Secondary | ICD-10-CM | POA: Diagnosis not present

## 2021-02-23 LAB — HEPATIC FUNCTION PANEL
ALT: 35 (ref 10–40)
AST: 32 (ref 14–40)
Alkaline Phosphatase: 244 — AB (ref 25–125)
Bilirubin, Total: 0.4

## 2021-02-23 LAB — BASIC METABOLIC PANEL
BUN: 16 (ref 4–21)
CO2: 24 — AB (ref 13–22)
Chloride: 112 — AB (ref 99–108)
Creatinine: 0.8 (ref 0.6–1.3)
Glucose: 105
Potassium: 3.9 (ref 3.4–5.3)
Sodium: 146 (ref 137–147)

## 2021-02-23 LAB — COMPREHENSIVE METABOLIC PANEL
Albumin: 3.5 (ref 3.5–5.0)
Calcium: 9.4 (ref 8.7–10.7)
Globulin: 3.8

## 2021-02-24 ENCOUNTER — Encounter: Payer: Self-pay | Admitting: Internal Medicine

## 2021-02-24 ENCOUNTER — Other Ambulatory Visit: Payer: Self-pay

## 2021-02-24 ENCOUNTER — Ambulatory Visit (INDEPENDENT_AMBULATORY_CARE_PROVIDER_SITE_OTHER): Payer: Medicare Other | Admitting: Internal Medicine

## 2021-02-24 VITALS — BP 109/73 | HR 77 | Temp 97.7°F

## 2021-02-24 DIAGNOSIS — R7881 Bacteremia: Secondary | ICD-10-CM

## 2021-02-24 DIAGNOSIS — I5032 Chronic diastolic (congestive) heart failure: Secondary | ICD-10-CM | POA: Diagnosis not present

## 2021-02-24 DIAGNOSIS — I6601 Occlusion and stenosis of right middle cerebral artery: Secondary | ICD-10-CM | POA: Diagnosis not present

## 2021-02-24 DIAGNOSIS — I69391 Dysphagia following cerebral infarction: Secondary | ICD-10-CM | POA: Diagnosis not present

## 2021-02-24 DIAGNOSIS — I69354 Hemiplegia and hemiparesis following cerebral infarction affecting left non-dominant side: Secondary | ICD-10-CM | POA: Diagnosis not present

## 2021-02-24 DIAGNOSIS — R41841 Cognitive communication deficit: Secondary | ICD-10-CM | POA: Diagnosis not present

## 2021-02-24 NOTE — Progress Notes (Signed)
Patient: [name] [name]  DOB: [birth year] MRN: [id number]   Chief Complaint  Patient presents with   Follow-up    Prosthetic valve endocarditis     Patient Active Problem List   Diagnosis Date Noted   Secondary hypercoagulable state ([place]) 02/01/2021   Acute on chronic diastolic CHF (congestive heart failure) ([place])    CHF exacerbation ([place]) 01/23/2021   Tremor of unknown origin 01/18/2021   Prosthetic valve endocarditis ([place])    Protein-calorie malnutrition, severe 12/18/2020   S/P MVR (mitral valve repair)    History of CVA in adulthood    Bacteremia 12/15/2020   History of aortic valve replacement    History of mitral valve replacement    Pressure injury of skin 12/14/2020   Hypernatremia 12/14/2020   Somnolence    Diabetes mellitus type II, controlled ([place]) 10/15/2020   Oropharyngeal dysphagia    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[place])    Acute ischemic right MCA stroke ([place]) 08/15/2020   CVA (cerebral vascular accident) ([place]) 08/07/2020   Middle cerebral artery embolism, 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   Subdural hemorrhage following injury 07/31/2020   S/P craniotomy 07/28/2020   Subdural hematoma 07/27/2020   Vertigo 08/30/2018   S/P right THA, 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   Long term (current) use of anticoagulants 01/05/2018   History of food allergy 12/11/2017   Allergic reaction 12/11/2017   Emesis 12/11/2017   Visit for monitoring Tikosyn therapy 06/06/2017   Internal hemorrhoid, bleeding 05/19/2016   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   Aortic valve disorder 07/09/2015   Long term current use of anticoagulant therapy 01/19/2015   Atrial fibrillation, chronic (HCC) 01/20/2014   Elevated PSA 12/23/2013   Prediabetes 12/19/2013   Benign prostatic hyperplasia with urinary obstruction 10/29/2013   left frontal cerebral infarct secondary to afib 08/05/2013   Atrial flutter ([place])  05/17/2013   Essential hypertension 06/19/2012   Routine health maintenance 04/26/2011   Persistent atrial fibrillation ([place]) 07/10/2008   Hyperlipidemia with target LDL less than 130 07/07/2008   History of colonic polyps 06/16/2008   ERECTILE DYSFUNCTION 09/25/2007     Subjective:  [name] [name] is a 80 y.o. 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 Today, he presents with his wife. Pt lives at [facility]. Wife reports that he is able to tolerate the amoxicillin. She believes if pt's PO intake does not improve, peg tube may be the next step.  Review of Systems  All other systems reviewed and are negative.  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     amLODipine (NORVASC) 10 MG tablet Take 1 tablet (10 mg total) by mouth daily.     amoxicillin (AMOXIL) 400 MG/5ML suspension Take 6.3 mLs (500 mg total) by mouth 2 (two) times daily. Please flavor.  Duration to be determined by infectious disease MD during outpatient follow-up.     apixaban (ELIQUIS) 5 MG TABS tablet Take 1 tablet (5 mg total) by mouth 2 (two) times daily. 60 tablet 0   Artificial Saliva (BIOTENE MOISTURIZING MOUTH MT) Use as directed 2 sprays in the mouth or throat in the morning, at noon, and at bedtime.     atorvastatin (LIPITOR) 80 MG tablet Take 1 tablet (80 mg total) by mouth daily.     bisacodyl (DULCOLAX) 10 MG suppository Place 1 suppository (10 mg total) rectally daily as needed for moderate constipation or mild constipation.     clobetasol cream (TEMOVATE) 7.03 % Apply 1 application topically daily as needed (for skin irritation).      dofetilide (TIKOSYN) 125 MCG capsule Take 3 capsules (375 mcg total) by mouth 2 (two) times daily.     famotidine (PEPCID) 20 MG tablet Take 20 mg by mouth at bedtime as needed for indigestion.     finasteride (PROSCAR) 5 MG tablet Take 1 tablet (5 mg total) by mouth daily. 30 tablet 0   hydrocortisone cream 1 % Apply topically 2 (two) times daily as needed for itching (rash). 30 g 0   lacosamide (VIMPAT) 50 MG TABS tablet Take 1 tablet (50 mg total) by mouth 2 (two) times daily. 60 tablet 3   levalbuterol (XOPENEX) 0.63 MG/3ML nebulizer solution Take 0.63 mg by nebulization every 6 (six) hours as needed for wheezing or shortness of breath.     magnesium gluconate (MAGONATE) 500 MG tablet Take 1 tablet (500 mg total) by mouth daily. 30 tablet 0   methenamine (HIPREX) 1 g tablet Take 1 g by mouth 2 (two) times daily with a meal.     Multiple Vitamin (MULTIVITAMIN WITH MINERALS) TABS tablet Take 1 tablet by mouth daily.     Polyethyl Glycol-Propyl Glycol (SYSTANE) 0.4-0.3 % SOLN Place 2 drops into  both eyes daily.     polyethylene glycol (MIRALAX / GLYCOLAX) 17 g packet Take 17 g by mouth daily.     potassium chloride SA (KLOR-CON M) 20 MEQ tablet Take 1 tablet (20 mEq total) by mouth daily. 90 tablet 3   senna-docusate (SENOKOT-S) 8.6-50 MG tablet Take 1 tablet by mouth 2 (two) times daily.     torsemide (DEMADEX) 20 MG tablet Take 20 mg by mouth daily as needed.     diphenhydramine-acetaminophen (TYLENOL PM) 25-500 MG TABS tablet Take 1 tablet by mouth at bedtime as needed.     No facility-administered medications prior to visit.     Allergies  Allergen Reactions   Naproxen Hives   No [facility] Food Allergies Other (  See Comments)    Scallops - distress, nausea and vomitting    Social History   Tobacco Use   Smoking status: Never   Smokeless tobacco: Never  Vaping Use   Vaping Use: Never used  Substance Use Topics   Alcohol use: Never    Comment: wine occasionally   Drug use: Never    Family History  Problem Relation Age of Onset   Macular degeneration Mother        macular degeneration   Cancer Father        intestinal/GI   Diabetes Paternal Aunt    Heart attack Maternal Grandfather    Diabetes Brother     Objective:   Vitals:   02/24/21 1536  BP: 109/73  Pulse: 77  Temp: 97.7 F (36.5 C)  SpO2: 99%   There is no height or weight on file to calculate BMI.  Physical Exam Constitutional:      General: He is not in acute distress.    Appearance: He is normal weight. He is not toxic-appearing.  HENT:     Head: Normocephalic and atraumatic.     Right Ear: External ear normal.     Left Ear: External ear normal.     Nose: No congestion or rhinorrhea.     Mouth/Throat:     Mouth: Mucous membranes are moist.     Pharynx: Oropharynx is clear.  Eyes:     Extraocular Movements: Extraocular movements intact.     Conjunctiva/sclera: Conjunctivae normal.     Pupils: Pupils are equal, round, and reactive to light.  Cardiovascular:     Rate and Rhythm:  Normal rate and regular rhythm.     Heart sounds: No murmur heard.   No friction rub. No gallop.  Pulmonary:     Effort: Pulmonary effort is normal.     Breath sounds: Normal breath sounds.  Abdominal:     General: Abdomen is flat. Bowel sounds are normal.     Palpations: Abdomen is soft.  Musculoskeletal:        General: No swelling.     Cervical back: Normal range of motion and neck supple.  Skin:    General: Skin is warm and dry.  Neurological:     General: No focal deficit present.  Psychiatric:        Mood and Affect: Mood normal.    Lab Results: Lab Results  Component Value Date   WBC 10.7 02/16/2021   HGB 9.9 (A) 02/16/2021   HCT 31 (A) 02/16/2021   MCV 85.0 02/05/2021   PLT 324 02/16/2021    Lab Results  Component Value Date   CREATININE 0.7 02/16/2021   BUN 22 (A) 02/16/2021   NA 148 (A) 02/16/2021   K 3.7 02/16/2021   CL 112 (A) 02/16/2021   CO2 23 (A) 02/16/2021    Lab Results  Component Value Date   ALT 34 02/08/2021   AST 27 02/08/2021   ALKPHOS 207 (A) 02/08/2021   BILITOT 0.7 01/23/2021     Assessment & Plan:    #E Faecalis bacteremia with prosthetic mitral valve endocarditis #Decreased PO intake -SP 6 weeks of ampicillin and ceftriaxone -On suppressive amoxicillin -Wife believes pt is less responsive/engaged which may be 2/2 being off Namenda. Pt is not back on Namenda.  Plan: Continue  suppressive amoxicillin Obtain surveillance blood Cx (unlikely pt broke through suppressive amox) Monitor ALP([place] labs show it has been in the low 200s) Follow up in 2 months  Laurice Record, MD [facility]   02/24/21  3:45 PM

## 2021-02-25 ENCOUNTER — Encounter: Payer: Self-pay | Admitting: Internal Medicine

## 2021-02-25 ENCOUNTER — Non-Acute Institutional Stay (SKILLED_NURSING_FACILITY): Payer: Medicare Other | Admitting: Internal Medicine

## 2021-02-25 DIAGNOSIS — I38 Endocarditis, valve unspecified: Secondary | ICD-10-CM | POA: Diagnosis not present

## 2021-02-25 DIAGNOSIS — G9341 Metabolic encephalopathy: Secondary | ICD-10-CM | POA: Diagnosis not present

## 2021-02-25 DIAGNOSIS — T826XXD Infection and inflammatory reaction due to cardiac valve prosthesis, subsequent encounter: Secondary | ICD-10-CM

## 2021-02-25 DIAGNOSIS — R278 Other lack of coordination: Secondary | ICD-10-CM | POA: Diagnosis not present

## 2021-02-25 DIAGNOSIS — I69354 Hemiplegia and hemiparesis following cerebral infarction affecting left non-dominant side: Secondary | ICD-10-CM

## 2021-02-25 DIAGNOSIS — S065X3S Traumatic subdural hemorrhage with loss of consciousness of 1 hour to 5 hours 59 minutes, sequela: Secondary | ICD-10-CM | POA: Diagnosis not present

## 2021-02-25 DIAGNOSIS — R41841 Cognitive communication deficit: Secondary | ICD-10-CM | POA: Diagnosis not present

## 2021-02-25 DIAGNOSIS — R293 Abnormal posture: Secondary | ICD-10-CM | POA: Diagnosis not present

## 2021-02-25 DIAGNOSIS — R1312 Dysphagia, oropharyngeal phase: Secondary | ICD-10-CM | POA: Diagnosis not present

## 2021-02-25 DIAGNOSIS — S069X3S Unspecified intracranial injury with loss of consciousness of 1 hour to 5 hours 59 minutes, sequela: Secondary | ICD-10-CM | POA: Diagnosis not present

## 2021-02-25 DIAGNOSIS — I6601 Occlusion and stenosis of right middle cerebral artery: Secondary | ICD-10-CM | POA: Diagnosis not present

## 2021-02-25 DIAGNOSIS — R41840 Attention and concentration deficit: Secondary | ICD-10-CM | POA: Diagnosis not present

## 2021-02-25 DIAGNOSIS — H538 Other visual disturbances: Secondary | ICD-10-CM | POA: Diagnosis not present

## 2021-02-25 DIAGNOSIS — E870 Hyperosmolality and hypernatremia: Secondary | ICD-10-CM

## 2021-02-25 DIAGNOSIS — I5032 Chronic diastolic (congestive) heart failure: Secondary | ICD-10-CM | POA: Diagnosis not present

## 2021-02-25 DIAGNOSIS — I69812 Visuospatial deficit and spatial neglect following other cerebrovascular disease: Secondary | ICD-10-CM | POA: Diagnosis not present

## 2021-02-25 DIAGNOSIS — M6389 Disorders of muscle in diseases classified elsewhere, multiple sites: Secondary | ICD-10-CM | POA: Diagnosis not present

## 2021-02-25 DIAGNOSIS — I69391 Dysphagia following cerebral infarction: Secondary | ICD-10-CM | POA: Diagnosis not present

## 2021-02-25 DIAGNOSIS — I5022 Chronic systolic (congestive) heart failure: Secondary | ICD-10-CM | POA: Diagnosis not present

## 2021-02-25 NOTE — Progress Notes (Signed)
Location:    [place] Room Number: [place] of Service:  SNF [phone]) Provider:  Veleta Miners MD   [name], MD  Patient Care Team: [name], MD as PCP - General (Internal Medicine) Larey Dresser, MD as PCP - Cardiology (Cardiology) [name], MD as Consulting Physician (Ophthalmology) [name], MD (Internal Medicine)  Extended Emergency Contact Information Primary Emergency Contact: [name] Address: [place], [place] [address] [name] of [place] Phone: [phone] Relation: Spouse Secondary Emergency Contact: [name], [place] [address] [name] of [place] Phone: [phone] Relation: Son  Code Status:  DNR Goals of care: Advanced Directive information Advanced Directives 02/25/2021  Does Patient Have a Medical Advance Directive? Yes  Type of Paramedic of [place];Living will;Out of facility DNR (pink MOST or yellow form)  Does patient want to make changes to medical advance directive? No - Patient declined  Copy of [place] in Chart? Yes - validated most recent copy scanned in chart (See row information)  Would patient like information on creating a medical advance directive? -  Pre-existing out of facility DNR order (yellow form or pink MOST form) -     Chief Complaint  Patient presents with   Acute Visit    HPI:  Pt is a 80 y.o. 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 intake getting lethargic.  Holding food and meds. He does respond to IV fluids. They want him to continue getting Boluses till Holidays and Amantadine takes effect to decide if they want Peg Tube Patient per nurses doing well today He got Bolus 2 days ago More Awake and Working better with OT For me he was slightly better responding. 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    Other and unspecified hyperlipidemia    Pre-diabetes    Previous back surgery 1978, jan 2007   Psychosexual dysfunction with inhibited sexual excitement    SOB (shortness of breath)    "with heavy exercise"   Stroke ([place]) 08/2013   "I WAS IN AFIB AND THREW  A CLOT, THE EFFECTS WERE TRANSITORY AND DIDNT LAST BUT FOR 30 MINUTES"    Thoracic aortic aneurysm    Past Surgical History:  Procedure Laterality Date   AORTIC AND MITRAL VALVE REPLACEMENT     09/2004   CARDIOVERSION     3 times from 2004-2006   CARDIOVERSION N/A 09/26/2013   Procedure: CARDIOVERSION;  Surgeon: Larey Dresser, MD;  Location: [facility];  Service: Cardiovascular;  Laterality: N/A;   CARDIOVERSION N/A 06/19/2014   Procedure: CARDIOVERSION;  Surgeon: Jerline Pain, MD;   Location: [facility];  Service: Cardiovascular;  Laterality: N/A;   CARDIOVERSION N/A 04/24/2017   Procedure: CARDIOVERSION;  Surgeon: Larey Dresser, MD;  Location: [facility];  Service: Cardiovascular;  Laterality: N/A;   CARDIOVERSION N/A 06/08/2017   Procedure: CARDIOVERSION;  Surgeon: Pixie Casino, MD;  Location: [facility] ENDOSCOPY;  Service: Cardiovascular;  Laterality: N/A;   CARDIOVERSION N/A 08/24/2017   Procedure: CARDIOVERSION;  Surgeon: Skeet Latch, MD;  Location: [facility] ENDOSCOPY;  Service: Cardiovascular;  Laterality: N/A;   CARDIOVERSION N/A 02/14/2018   Procedure: CARDIOVERSION;  Surgeon: Larey Dresser, MD;  Location: [facility] ENDOSCOPY;  Service: Cardiovascular;  Laterality: N/A;   COLONOSCOPY     CRANIOTOMY N/A 07/28/2020   Procedure: CRANIOTOMY FOR EVACUATION OF SUBDURAL HEMATOMA;  Surgeon: Eustace Lieber, MD;  Location: [facility];  Service: Neurosurgery;  Laterality: N/A;   IR ANGIO VERTEBRAL SEL SUBCLAVIAN INNOMINATE UNI R MOD SED  08/07/2020   IR CT HEAD LTD  08/07/2020   IR PERCUTANEOUS ART THROMBECTOMY/INFUSION INTRACRANIAL INC DIAG ANGIO  08/07/2020   laminectomies     10/1975 and in 03/2005   RADIOLOGY WITH ANESTHESIA N/A 08/07/2020   Procedure: IR WITH ANESTHESIA;  Surgeon: Luanne Bras, MD;  Location: [facility];  Service: Radiology;  Laterality: N/A;   TEE WITHOUT CARDIOVERSION N/A 12/18/2020   Procedure: TRANSESOPHAGEAL ECHOCARDIOGRAM (TEE);  Surgeon: Larey Dresser, MD;  Location: [facility] ENDOSCOPY;  Service: Cardiovascular;  Laterality: N/A;   [place] Right 04/03/2018   Procedure: TOTAL HIP ARTHROPLASTY ANTERIOR APPROACH;  Surgeon: Paralee Cancel, MD;  Location: [facility];  Service: Orthopedics;  Laterality: Right;  70 mins    Allergies  Allergen Reactions   Naproxen Hives   No [facility] Food Allergies Other (See Comments)    Scallops - distress, nausea and vomitting    Allergies as of 02/25/2021       Reactions   Naproxen  Hives   No [facility] Food Allergies Other (See Comments)   Scallops - distress, nausea and vomitting        Medication List        Accurate as of February 25, 2021  4:00 PM. If you have any questions, ask your nurse or doctor.          amantadine 100 MG capsule Commonly known as: SYMMETREL Take 100 mg by mouth daily.   amLODipine 10 MG tablet Commonly known as: NORVASC Take 1 tablet (10 mg total) by mouth daily.   amoxicillin 400 MG/5ML suspension Commonly known as: AMOXIL Take 6.3 mLs (500 mg total) by mouth 2 (two) times daily. Please flavor.  Duration to be determined by infectious disease MD during outpatient follow-up.   apixaban 5 MG Tabs tablet Commonly known as: ELIQUIS Take 1 tablet (5 mg total) by mouth 2 (two) times daily.   atorvastatin 80 MG tablet Commonly known as: LIPITOR Take 1 tablet (80 mg total) by mouth daily.  BIOTENE MOISTURIZING MOUTH MT Use as directed 2 sprays in the mouth or throat in the morning, at noon, and at bedtime.   bisacodyl 10 MG suppository Commonly known as: DULCOLAX Place 1 suppository (10 mg total) rectally daily as needed for moderate constipation or mild constipation.   clobetasol cream 0.05 % Commonly known as: TEMOVATE Apply 1 application topically daily as needed (for skin irritation).   diphenhydramine-acetaminophen 25-500 MG Tabs tablet Commonly known as: TYLENOL PM Take 1 tablet by mouth at bedtime as needed.   dofetilide 125 MCG capsule Commonly known as: TIKOSYN Take 3 capsules (375 mcg total) by mouth 2 (two) times daily.   famotidine 20 MG tablet Commonly known as: PEPCID Take 20 mg by mouth at bedtime as needed for indigestion.   finasteride 5 MG tablet Commonly known as: PROSCAR Take 1 tablet (5 mg total) by mouth daily.   hydrocortisone cream 1 % Apply topically 2 (two) times daily as needed for itching (rash).   lacosamide 50 MG Tabs tablet Commonly known as: VIMPAT Take 1 tablet (50 mg  total) by mouth 2 (two) times daily.   levalbuterol 0.63 MG/3ML nebulizer solution Commonly known as: XOPENEX Take 0.63 mg by nebulization every 6 (six) hours as needed for wheezing or shortness of breath.   magnesium gluconate 500 MG tablet Commonly known as: MAGONATE Take 1 tablet (500 mg total) by mouth daily.   methenamine 1 g tablet Commonly known as: HIPREX Take 1 g by mouth 2 (two) times daily with a meal.   multivitamin with minerals Tabs tablet Take 1 tablet by mouth daily.   polyethylene glycol 17 g packet Commonly known as: MIRALAX / GLYCOLAX Take 17 g by mouth daily.   potassium chloride SA 20 MEQ tablet Commonly known as: KLOR-CON M Take 1 tablet (20 mEq total) by mouth daily.   senna-docusate 8.6-50 MG tablet Commonly known as: Senokot-S Take 1 tablet by mouth 2 (two) times daily.   Systane 0.4-0.3 % Soln Generic drug: Polyethyl Glycol-Propyl Glycol Place 2 drops into both eyes daily.   torsemide 20 MG tablet Commonly known as: DEMADEX Take 20 mg by mouth daily as needed.        Review of Systems  Unable to perform ROS: Other   Immunization History  Administered Date(s) Administered   Fluad Quad(high Dose 65+) 12/06/2018, 12/09/2020   Influenza Whole 12/25/2008, 01/05/2010, 12/30/2011   Influenza, High Dose Seasonal PF 12/14/2016   Influenza,inj,Quad PF,6+ Mos 12/19/2013   Influenza,inj,quad, With Preservative 11/22/2017   Influenza-Unspecified 12/14/2015, 12/24/2019   Moderna Covid-19 Vaccine Bivalent Booster 85yrs & up 01/12/2021   Moderna SARS-COV2 Booster Vaccination 01/02/2020, 06/26/2020   Moderna Sars-Covid-2 Vaccination 04/08/2019, 05/05/2019   Pneumococcal Conjugate-13 12/19/2013   Pneumococcal Polysaccharide-23 09/24/2007, 05/22/2017   Td 12/25/2008   Tdap 03/22/2018   Zoster Recombinat (Shingrix) 07/16/2019, 10/28/2019   Zoster, Live 09/24/2007   Pertinent  Health Maintenance Due  Topic Date Due   FOOT EXAM  Never done   URINE  MICROALBUMIN  Never done   OPHTHALMOLOGY EXAM  04/07/2021 (Originally 10/04/1950)   HEMOGLOBIN A1C  06/17/2021   INFLUENZA VACCINE  Completed   COLONOSCOPY (Pts 45-31yrs Insurance coverage will need to be confirmed)  Discontinued   Fall Risk 01/26/2021 01/26/2021 01/26/2021 01/27/2021 02/24/2021  Falls in the past year? - - - - 0  Was there an injury with Fall? - - - - 0  Fall Risk Category Calculator - - - - 0  Fall Risk Category - - - -  Low  Patient Fall Risk Level _0   Patient at Risk for Falls Due to - - - - Impaired mobility  Patient at Risk for Falls Due to - - - - -  Fall risk Follow up - - - - Falls evaluation completed   Functional Status Survey:    Vitals:   02/25/21 1542  BP: 130/70  Pulse: 85  Resp: 18  Temp: 97.9 F (36.6 C)  SpO2: 94%  Weight: 153 lb 12.8 oz (69.8 kg)  Height: 5' 7" (1.702 m)   Body mass index is 24.09 kg/m. Physical Exam Vitals reviewed.  Constitutional:      Comments: Is Little More alert. Responding more. Was responding some more but then starts looking other way  HENT:     Head: Normocephalic.     Mouth/Throat:     Mouth: Mucous membranes are moist.     Pharynx: Oropharynx is clear.  Eyes:     Pupils: Pupils are equal, round, and reactive to light.  Cardiovascular:     Rate and Rhythm: Normal rate and regular rhythm.     Pulses: Normal pulses.     Heart sounds: Murmur heard.  Pulmonary:     Effort: Pulmonary effort is normal. No respiratory distress.     Breath sounds: Normal breath sounds. No rales.  Abdominal:     General: Abdomen is flat. Bowel sounds are normal.     Palpations: Abdomen is soft.  Musculoskeletal:        General: No swelling.     Cervical back: Neck supple.  Skin:    General: Skin is warm.  Neurological:     Comments: Left UE contracture Left Leg moves spontaneously Has good strength in RUE and RLE  Psychiatric:        Mood and  Affect: Mood normal.        Thought Content: Thought content normal.    Labs reviewed: Recent Labs    12/18/20 1630 12/19/20 0610 12/19/20 1632 12/20/20 0153 01/25/21 8115 01/25/21 0853 01/26/21 0435 01/27/21 0538 02/01/21 0000 02/08/21 0000 02/09/21 0000 02/11/21 0000 02/15/21 0000 02/16/21 0000  NA  --  139  --    < > 143  --  143 140   < > 150*   < > 146 154* 148*  K  --  3.5  --    < > 3.6  --  3.8 3.5   < > 3.7   < > 3.4 3.6 3.7  CL  --  108  --    < > 107  --  104 107   < > 107   < > 111* 114* 112*  CO2  --  23  --    < > 27  --  26 25   < > 22   < > 25* 23* 23*  GLUCOSE  --  186*  --    < > 93  --  105* 137*  --   --   --   --   --   --   BUN  --  16  --    < > 11  --  12 15   < > 23*   < > 19 23* 22*  CREATININE  --  0.76  --    < > 0.94  --  0.95 1.06   < > 0.9   < > 0.7 0.8 0.7  CALCIUM  --  7.7*  --    < > 8.2*  --  8.7* 8.7*   < > 9.9   < > 9.4 9.7 9.8  MG 1.9 2.0 1.9   < >  --    < > 2.2 2.4  --  2.1  --   --   --   --   PHOS 3.1 2.7 2.4*  --   --   --   --   --   --   --   --   --   --   --    < > = values in this interval not displayed.   Recent Labs    01/03/21 0221 01/08/21 0000 01/20/21 1631 01/23/21 1355 02/08/21 0000  AST 32   < > 27 37 27  ALT 63*   < > 35 34 34  ALKPHOS 94   < > 115 107 207*  BILITOT 0.4  --  0.6 0.7  --   PROT 6.3*  --  6.9 6.9  --   ALBUMIN 1.9*   < > 2.5* 2.4* 3.8   < > = values in this interval not displayed.   Recent Labs    08/16/20 0427 08/17/20 0340 12/14/20 1245 12/14/20 1504 12/16/20 0130 12/18/20 0221 01/24/21 0154 01/25/21 0623 01/26/21 0435 02/01/21 0000 02/05/21 1111 02/08/21 0000 02/11/21 0000 02/16/21 0000  WBC 7.9   < > 12.0*   < > 10.7*   < > 10.0  --  10.4   < > 9.8 11.0 10.7 10.7  NEUTROABS 5.7  --  9.3*  --  7.7  --   --   --   --   --   --   --   --   --   HGB 9.0*   < > 11.7*   < > 9.9*   < > 7.3*   < > 7.9*   < > 9.8* 9.4* 9.1* 9.9*  HCT 28.2*   < > 36.8*   < > 30.7*   < > 23.3*   < >  25.9*   < > 31.8* 31* 30* 31*  MCV 98.6   < > 90.0   < > 88.0   < > 88.3  --  88.7  --  85.0  --   --   --   PLT 177   < > 119*   < > 106*   < > 395  --  417*   < > 462* 406* 335 324   < > = values in this interval not displayed.   Lab Results  Component Value Date   TSH 1.773 01/24/2021   Lab Results  Component Value Date   HGBA1C 7.3 (H) 12/17/2020   Lab Results  Component Value Date   CHOL 119 08/08/2020   HDL 42 08/08/2020   LDLCALC 56 08/08/2020   TRIG 105 08/08/2020   CHOLHDL 2.8 08/08/2020    Significant Diagnostic Results in last 30 days:  No results found.  Assessment/Plan  Hypernatremia Will give 250 cc D5 W if Sodium more then 147 Check BMP every 2 days Family wants to decide about Peg Tube after holidays Patient not able to keep Oral intake They want to wait till Christmas before giving final answers. 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  Oropharyngeal dysphagia D 3 with Aspiration precautions   Prosthetic valve endocarditis, subsequent encounter On Amoxicillin Indefinite  Repeat Blood Culture per ID Chronic diastolic CHF (congestive heart failure) (HCC) Demadex on hold due to Poor intake and Hypernatremia Trying to follow his Weight closely    Urinary retention due to benign prostatic hyperplasia Needs PRN Cath on Proscar Also on Hiprex per  urology Atrial fibrillation, chronic (HCC) On Eliquis and Tikosyn Hyperlipidemia, unspecified hyperlipidemia type On statin Essential hypertension Continue Norvasc BP on Lower side Will watch it fo rnow Anemia On [place] Family/ staff Communication:   Labs/tests ordered:   BMP Q 2 days times 2

## 2021-02-26 ENCOUNTER — Ambulatory Visit: Payer: Medicare Other | Admitting: Internal Medicine

## 2021-02-26 DIAGNOSIS — I69391 Dysphagia following cerebral infarction: Secondary | ICD-10-CM | POA: Diagnosis not present

## 2021-02-26 DIAGNOSIS — I6601 Occlusion and stenosis of right middle cerebral artery: Secondary | ICD-10-CM | POA: Diagnosis not present

## 2021-02-26 DIAGNOSIS — R41841 Cognitive communication deficit: Secondary | ICD-10-CM | POA: Diagnosis not present

## 2021-02-26 DIAGNOSIS — I5032 Chronic diastolic (congestive) heart failure: Secondary | ICD-10-CM | POA: Diagnosis not present

## 2021-02-26 DIAGNOSIS — I69354 Hemiplegia and hemiparesis following cerebral infarction affecting left non-dominant side: Secondary | ICD-10-CM | POA: Diagnosis not present

## 2021-02-26 LAB — BASIC METABOLIC PANEL
BUN: 13 (ref 4–21)
CO2: 23 — AB (ref 13–22)
Chloride: 112 — AB (ref 99–108)
Creatinine: 0.7 (ref 0.6–1.3)
Glucose: 89
Potassium: 4.2 (ref 3.4–5.3)
Sodium: 145 (ref 137–147)

## 2021-02-26 LAB — COMPREHENSIVE METABOLIC PANEL: Calcium: 8.8 (ref 8.7–10.7)

## 2021-03-01 DIAGNOSIS — I5032 Chronic diastolic (congestive) heart failure: Secondary | ICD-10-CM | POA: Diagnosis not present

## 2021-03-01 LAB — BASIC METABOLIC PANEL
BUN: 10 (ref 4–21)
CO2: 22 (ref 13–22)
Chloride: 108 (ref 99–108)
Creatinine: 0.6 (ref 0.6–1.3)
Glucose: 91
Potassium: 4 (ref 3.4–5.3)
Sodium: 142 (ref 137–147)

## 2021-03-01 LAB — COMPREHENSIVE METABOLIC PANEL: Calcium: 9.1 (ref 8.7–10.7)

## 2021-03-02 ENCOUNTER — Other Ambulatory Visit (HOSPITAL_BASED_OUTPATIENT_CLINIC_OR_DEPARTMENT_OTHER): Payer: Self-pay

## 2021-03-02 DIAGNOSIS — I5022 Chronic systolic (congestive) heart failure: Secondary | ICD-10-CM | POA: Diagnosis not present

## 2021-03-02 DIAGNOSIS — S065X3S Traumatic subdural hemorrhage with loss of consciousness of 1 hour to 5 hours 59 minutes, sequela: Secondary | ICD-10-CM | POA: Diagnosis not present

## 2021-03-02 DIAGNOSIS — I69812 Visuospatial deficit and spatial neglect following other cerebrovascular disease: Secondary | ICD-10-CM | POA: Diagnosis not present

## 2021-03-02 DIAGNOSIS — I6601 Occlusion and stenosis of right middle cerebral artery: Secondary | ICD-10-CM | POA: Diagnosis not present

## 2021-03-02 DIAGNOSIS — H538 Other visual disturbances: Secondary | ICD-10-CM | POA: Diagnosis not present

## 2021-03-02 DIAGNOSIS — R293 Abnormal posture: Secondary | ICD-10-CM | POA: Diagnosis not present

## 2021-03-02 DIAGNOSIS — R41840 Attention and concentration deficit: Secondary | ICD-10-CM | POA: Diagnosis not present

## 2021-03-02 DIAGNOSIS — G9341 Metabolic encephalopathy: Secondary | ICD-10-CM | POA: Diagnosis not present

## 2021-03-02 DIAGNOSIS — M6389 Disorders of muscle in diseases classified elsewhere, multiple sites: Secondary | ICD-10-CM | POA: Diagnosis not present

## 2021-03-02 DIAGNOSIS — R278 Other lack of coordination: Secondary | ICD-10-CM | POA: Diagnosis not present

## 2021-03-02 LAB — CULTURE, BLOOD (SINGLE)
MICRO NUMBER:: 12787036
MICRO NUMBER:: 12787037
Result:: NO GROWTH
SPECIMEN QUALITY:: ADEQUATE

## 2021-03-03 DIAGNOSIS — R41840 Attention and concentration deficit: Secondary | ICD-10-CM | POA: Diagnosis not present

## 2021-03-03 DIAGNOSIS — I6601 Occlusion and stenosis of right middle cerebral artery: Secondary | ICD-10-CM | POA: Diagnosis not present

## 2021-03-03 DIAGNOSIS — I5022 Chronic systolic (congestive) heart failure: Secondary | ICD-10-CM | POA: Diagnosis not present

## 2021-03-03 DIAGNOSIS — S065X3S Traumatic subdural hemorrhage with loss of consciousness of 1 hour to 5 hours 59 minutes, sequela: Secondary | ICD-10-CM | POA: Diagnosis not present

## 2021-03-03 DIAGNOSIS — R293 Abnormal posture: Secondary | ICD-10-CM | POA: Diagnosis not present

## 2021-03-03 DIAGNOSIS — I69812 Visuospatial deficit and spatial neglect following other cerebrovascular disease: Secondary | ICD-10-CM | POA: Diagnosis not present

## 2021-03-03 DIAGNOSIS — H538 Other visual disturbances: Secondary | ICD-10-CM | POA: Diagnosis not present

## 2021-03-03 DIAGNOSIS — G9341 Metabolic encephalopathy: Secondary | ICD-10-CM | POA: Diagnosis not present

## 2021-03-03 DIAGNOSIS — M6389 Disorders of muscle in diseases classified elsewhere, multiple sites: Secondary | ICD-10-CM | POA: Diagnosis not present

## 2021-03-03 DIAGNOSIS — R278 Other lack of coordination: Secondary | ICD-10-CM | POA: Diagnosis not present

## 2021-03-04 DIAGNOSIS — R41841 Cognitive communication deficit: Secondary | ICD-10-CM | POA: Diagnosis not present

## 2021-03-04 DIAGNOSIS — R278 Other lack of coordination: Secondary | ICD-10-CM | POA: Diagnosis not present

## 2021-03-04 DIAGNOSIS — M6389 Disorders of muscle in diseases classified elsewhere, multiple sites: Secondary | ICD-10-CM | POA: Diagnosis not present

## 2021-03-04 DIAGNOSIS — G9341 Metabolic encephalopathy: Secondary | ICD-10-CM | POA: Diagnosis not present

## 2021-03-04 DIAGNOSIS — I69354 Hemiplegia and hemiparesis following cerebral infarction affecting left non-dominant side: Secondary | ICD-10-CM | POA: Diagnosis not present

## 2021-03-04 DIAGNOSIS — R41840 Attention and concentration deficit: Secondary | ICD-10-CM | POA: Diagnosis not present

## 2021-03-04 DIAGNOSIS — I69812 Visuospatial deficit and spatial neglect following other cerebrovascular disease: Secondary | ICD-10-CM | POA: Diagnosis not present

## 2021-03-04 DIAGNOSIS — I5032 Chronic diastolic (congestive) heart failure: Secondary | ICD-10-CM | POA: Diagnosis not present

## 2021-03-04 DIAGNOSIS — I69391 Dysphagia following cerebral infarction: Secondary | ICD-10-CM | POA: Diagnosis not present

## 2021-03-04 DIAGNOSIS — I5022 Chronic systolic (congestive) heart failure: Secondary | ICD-10-CM | POA: Diagnosis not present

## 2021-03-04 DIAGNOSIS — S065X3S Traumatic subdural hemorrhage with loss of consciousness of 1 hour to 5 hours 59 minutes, sequela: Secondary | ICD-10-CM | POA: Diagnosis not present

## 2021-03-04 DIAGNOSIS — H538 Other visual disturbances: Secondary | ICD-10-CM | POA: Diagnosis not present

## 2021-03-04 DIAGNOSIS — I6601 Occlusion and stenosis of right middle cerebral artery: Secondary | ICD-10-CM | POA: Diagnosis not present

## 2021-03-04 DIAGNOSIS — R293 Abnormal posture: Secondary | ICD-10-CM | POA: Diagnosis not present

## 2021-03-04 LAB — BASIC METABOLIC PANEL
BUN: 8 (ref 4–21)
CO2: 22 (ref 13–22)
Chloride: 102 (ref 99–108)
Creatinine: 0.6 (ref 0.6–1.3)
Glucose: 118
Potassium: 4.1 (ref 3.4–5.3)
Sodium: 139 (ref 137–147)

## 2021-03-04 LAB — CBC: RBC: 4.16 (ref 3.87–5.11)

## 2021-03-04 LAB — CBC AND DIFFERENTIAL
HCT: 34 — AB (ref 41–53)
Hemoglobin: 10.6 — AB (ref 13.5–17.5)
Platelets: 313 (ref 150–399)
WBC: 10.1

## 2021-03-04 LAB — COMPREHENSIVE METABOLIC PANEL: Calcium: 9.4 (ref 8.7–10.7)

## 2021-03-05 DIAGNOSIS — R41840 Attention and concentration deficit: Secondary | ICD-10-CM | POA: Diagnosis not present

## 2021-03-05 DIAGNOSIS — S065X3S Traumatic subdural hemorrhage with loss of consciousness of 1 hour to 5 hours 59 minutes, sequela: Secondary | ICD-10-CM | POA: Diagnosis not present

## 2021-03-05 DIAGNOSIS — I69812 Visuospatial deficit and spatial neglect following other cerebrovascular disease: Secondary | ICD-10-CM | POA: Diagnosis not present

## 2021-03-05 DIAGNOSIS — H538 Other visual disturbances: Secondary | ICD-10-CM | POA: Diagnosis not present

## 2021-03-05 DIAGNOSIS — I5032 Chronic diastolic (congestive) heart failure: Secondary | ICD-10-CM | POA: Diagnosis not present

## 2021-03-05 DIAGNOSIS — R278 Other lack of coordination: Secondary | ICD-10-CM | POA: Diagnosis not present

## 2021-03-05 DIAGNOSIS — I69391 Dysphagia following cerebral infarction: Secondary | ICD-10-CM | POA: Diagnosis not present

## 2021-03-05 DIAGNOSIS — G9341 Metabolic encephalopathy: Secondary | ICD-10-CM | POA: Diagnosis not present

## 2021-03-05 DIAGNOSIS — I69354 Hemiplegia and hemiparesis following cerebral infarction affecting left non-dominant side: Secondary | ICD-10-CM | POA: Diagnosis not present

## 2021-03-05 DIAGNOSIS — I6601 Occlusion and stenosis of right middle cerebral artery: Secondary | ICD-10-CM | POA: Diagnosis not present

## 2021-03-05 DIAGNOSIS — M6389 Disorders of muscle in diseases classified elsewhere, multiple sites: Secondary | ICD-10-CM | POA: Diagnosis not present

## 2021-03-05 DIAGNOSIS — I5022 Chronic systolic (congestive) heart failure: Secondary | ICD-10-CM | POA: Diagnosis not present

## 2021-03-05 DIAGNOSIS — R293 Abnormal posture: Secondary | ICD-10-CM | POA: Diagnosis not present

## 2021-03-05 DIAGNOSIS — R41841 Cognitive communication deficit: Secondary | ICD-10-CM | POA: Diagnosis not present

## 2021-03-08 DIAGNOSIS — R41840 Attention and concentration deficit: Secondary | ICD-10-CM | POA: Diagnosis not present

## 2021-03-08 DIAGNOSIS — I5032 Chronic diastolic (congestive) heart failure: Secondary | ICD-10-CM | POA: Diagnosis not present

## 2021-03-08 DIAGNOSIS — H538 Other visual disturbances: Secondary | ICD-10-CM | POA: Diagnosis not present

## 2021-03-08 DIAGNOSIS — R278 Other lack of coordination: Secondary | ICD-10-CM | POA: Diagnosis not present

## 2021-03-08 DIAGNOSIS — I69391 Dysphagia following cerebral infarction: Secondary | ICD-10-CM | POA: Diagnosis not present

## 2021-03-08 DIAGNOSIS — G9341 Metabolic encephalopathy: Secondary | ICD-10-CM | POA: Diagnosis not present

## 2021-03-08 DIAGNOSIS — I69812 Visuospatial deficit and spatial neglect following other cerebrovascular disease: Secondary | ICD-10-CM | POA: Diagnosis not present

## 2021-03-08 DIAGNOSIS — S065X3S Traumatic subdural hemorrhage with loss of consciousness of 1 hour to 5 hours 59 minutes, sequela: Secondary | ICD-10-CM | POA: Diagnosis not present

## 2021-03-08 DIAGNOSIS — I6601 Occlusion and stenosis of right middle cerebral artery: Secondary | ICD-10-CM | POA: Diagnosis not present

## 2021-03-08 DIAGNOSIS — I5022 Chronic systolic (congestive) heart failure: Secondary | ICD-10-CM | POA: Diagnosis not present

## 2021-03-08 DIAGNOSIS — I69354 Hemiplegia and hemiparesis following cerebral infarction affecting left non-dominant side: Secondary | ICD-10-CM | POA: Diagnosis not present

## 2021-03-08 DIAGNOSIS — M6389 Disorders of muscle in diseases classified elsewhere, multiple sites: Secondary | ICD-10-CM | POA: Diagnosis not present

## 2021-03-08 DIAGNOSIS — R293 Abnormal posture: Secondary | ICD-10-CM | POA: Diagnosis not present

## 2021-03-08 DIAGNOSIS — R41841 Cognitive communication deficit: Secondary | ICD-10-CM | POA: Diagnosis not present

## 2021-03-09 DIAGNOSIS — I6601 Occlusion and stenosis of right middle cerebral artery: Secondary | ICD-10-CM | POA: Diagnosis not present

## 2021-03-09 DIAGNOSIS — R41840 Attention and concentration deficit: Secondary | ICD-10-CM | POA: Diagnosis not present

## 2021-03-09 DIAGNOSIS — R293 Abnormal posture: Secondary | ICD-10-CM | POA: Diagnosis not present

## 2021-03-09 DIAGNOSIS — I339 Acute and subacute endocarditis, unspecified: Secondary | ICD-10-CM | POA: Diagnosis not present

## 2021-03-09 DIAGNOSIS — I5032 Chronic diastolic (congestive) heart failure: Secondary | ICD-10-CM | POA: Diagnosis not present

## 2021-03-09 DIAGNOSIS — I69354 Hemiplegia and hemiparesis following cerebral infarction affecting left non-dominant side: Secondary | ICD-10-CM | POA: Diagnosis not present

## 2021-03-09 DIAGNOSIS — M6389 Disorders of muscle in diseases classified elsewhere, multiple sites: Secondary | ICD-10-CM | POA: Diagnosis not present

## 2021-03-09 DIAGNOSIS — R41841 Cognitive communication deficit: Secondary | ICD-10-CM | POA: Diagnosis not present

## 2021-03-09 DIAGNOSIS — S065X3S Traumatic subdural hemorrhage with loss of consciousness of 1 hour to 5 hours 59 minutes, sequela: Secondary | ICD-10-CM | POA: Diagnosis not present

## 2021-03-09 DIAGNOSIS — G9341 Metabolic encephalopathy: Secondary | ICD-10-CM | POA: Diagnosis not present

## 2021-03-09 DIAGNOSIS — H538 Other visual disturbances: Secondary | ICD-10-CM | POA: Diagnosis not present

## 2021-03-09 DIAGNOSIS — I69391 Dysphagia following cerebral infarction: Secondary | ICD-10-CM | POA: Diagnosis not present

## 2021-03-09 DIAGNOSIS — I5022 Chronic systolic (congestive) heart failure: Secondary | ICD-10-CM | POA: Diagnosis not present

## 2021-03-09 DIAGNOSIS — I69812 Visuospatial deficit and spatial neglect following other cerebrovascular disease: Secondary | ICD-10-CM | POA: Diagnosis not present

## 2021-03-09 DIAGNOSIS — R278 Other lack of coordination: Secondary | ICD-10-CM | POA: Diagnosis not present

## 2021-03-09 LAB — CBC AND DIFFERENTIAL
HCT: 32 — AB (ref 41–53)
Hemoglobin: 10.2 — AB (ref 13.5–17.5)
Platelets: 285 (ref 150–399)
WBC: 9.7

## 2021-03-09 LAB — HEPATIC FUNCTION PANEL
ALT: 23 (ref 10–40)
AST: 23 (ref 14–40)
Alkaline Phosphatase: 230 — AB (ref 25–125)
Bilirubin, Total: 0.4

## 2021-03-09 LAB — BASIC METABOLIC PANEL
BUN: 13 (ref 4–21)
CO2: 23 — AB (ref 13–22)
Chloride: 102 (ref 99–108)
Creatinine: 0.6 (ref 0.6–1.3)
Glucose: 97
Potassium: 4 (ref 3.4–5.3)
Sodium: 141 (ref 137–147)

## 2021-03-09 LAB — COMPREHENSIVE METABOLIC PANEL WITH GFR
Albumin: 3.6 (ref 3.5–5.0)
Calcium: 9.1 (ref 8.7–10.7)
Globulin: 3.3

## 2021-03-09 LAB — POCT ERYTHROCYTE SEDIMENTATION RATE, NON-AUTOMATED: Sed Rate: 52

## 2021-03-09 LAB — CBC: RBC: 3.89 (ref 3.87–5.11)

## 2021-03-10 ENCOUNTER — Encounter (HOSPITAL_COMMUNITY): Payer: Medicare Other | Admitting: Cardiology

## 2021-03-10 DIAGNOSIS — S065X3S Traumatic subdural hemorrhage with loss of consciousness of 1 hour to 5 hours 59 minutes, sequela: Secondary | ICD-10-CM | POA: Diagnosis not present

## 2021-03-10 DIAGNOSIS — I69812 Visuospatial deficit and spatial neglect following other cerebrovascular disease: Secondary | ICD-10-CM | POA: Diagnosis not present

## 2021-03-10 DIAGNOSIS — R41840 Attention and concentration deficit: Secondary | ICD-10-CM | POA: Diagnosis not present

## 2021-03-10 DIAGNOSIS — R41841 Cognitive communication deficit: Secondary | ICD-10-CM | POA: Diagnosis not present

## 2021-03-10 DIAGNOSIS — I69354 Hemiplegia and hemiparesis following cerebral infarction affecting left non-dominant side: Secondary | ICD-10-CM | POA: Diagnosis not present

## 2021-03-10 DIAGNOSIS — I6601 Occlusion and stenosis of right middle cerebral artery: Secondary | ICD-10-CM | POA: Diagnosis not present

## 2021-03-10 DIAGNOSIS — H538 Other visual disturbances: Secondary | ICD-10-CM | POA: Diagnosis not present

## 2021-03-10 DIAGNOSIS — I5032 Chronic diastolic (congestive) heart failure: Secondary | ICD-10-CM | POA: Diagnosis not present

## 2021-03-10 DIAGNOSIS — G9341 Metabolic encephalopathy: Secondary | ICD-10-CM | POA: Diagnosis not present

## 2021-03-10 DIAGNOSIS — R278 Other lack of coordination: Secondary | ICD-10-CM | POA: Diagnosis not present

## 2021-03-10 DIAGNOSIS — M6389 Disorders of muscle in diseases classified elsewhere, multiple sites: Secondary | ICD-10-CM | POA: Diagnosis not present

## 2021-03-10 DIAGNOSIS — R293 Abnormal posture: Secondary | ICD-10-CM | POA: Diagnosis not present

## 2021-03-10 DIAGNOSIS — I5022 Chronic systolic (congestive) heart failure: Secondary | ICD-10-CM | POA: Diagnosis not present

## 2021-03-10 DIAGNOSIS — I69391 Dysphagia following cerebral infarction: Secondary | ICD-10-CM | POA: Diagnosis not present

## 2021-03-11 ENCOUNTER — Non-Acute Institutional Stay (SKILLED_NURSING_FACILITY): Payer: Medicare Other | Admitting: Adult Health

## 2021-03-11 ENCOUNTER — Encounter: Payer: Self-pay | Admitting: Adult Health

## 2021-03-11 DIAGNOSIS — N401 Enlarged prostate with lower urinary tract symptoms: Secondary | ICD-10-CM | POA: Diagnosis not present

## 2021-03-11 DIAGNOSIS — I38 Endocarditis, valve unspecified: Secondary | ICD-10-CM

## 2021-03-11 DIAGNOSIS — I69354 Hemiplegia and hemiparesis following cerebral infarction affecting left non-dominant side: Secondary | ICD-10-CM | POA: Diagnosis not present

## 2021-03-11 DIAGNOSIS — R338 Other retention of urine: Secondary | ICD-10-CM | POA: Diagnosis not present

## 2021-03-11 DIAGNOSIS — I5022 Chronic systolic (congestive) heart failure: Secondary | ICD-10-CM | POA: Diagnosis not present

## 2021-03-11 DIAGNOSIS — R5383 Other fatigue: Secondary | ICD-10-CM

## 2021-03-11 DIAGNOSIS — E870 Hyperosmolality and hypernatremia: Secondary | ICD-10-CM

## 2021-03-11 DIAGNOSIS — S069X3S Unspecified intracranial injury with loss of consciousness of 1 hour to 5 hours 59 minutes, sequela: Secondary | ICD-10-CM

## 2021-03-11 DIAGNOSIS — R41841 Cognitive communication deficit: Secondary | ICD-10-CM | POA: Diagnosis not present

## 2021-03-11 DIAGNOSIS — I69812 Visuospatial deficit and spatial neglect following other cerebrovascular disease: Secondary | ICD-10-CM | POA: Diagnosis not present

## 2021-03-11 DIAGNOSIS — R293 Abnormal posture: Secondary | ICD-10-CM | POA: Diagnosis not present

## 2021-03-11 DIAGNOSIS — T826XXD Infection and inflammatory reaction due to cardiac valve prosthesis, subsequent encounter: Secondary | ICD-10-CM

## 2021-03-11 DIAGNOSIS — I69391 Dysphagia following cerebral infarction: Secondary | ICD-10-CM | POA: Diagnosis not present

## 2021-03-11 DIAGNOSIS — E118 Type 2 diabetes mellitus with unspecified complications: Secondary | ICD-10-CM | POA: Diagnosis not present

## 2021-03-11 DIAGNOSIS — H538 Other visual disturbances: Secondary | ICD-10-CM | POA: Diagnosis not present

## 2021-03-11 DIAGNOSIS — R41840 Attention and concentration deficit: Secondary | ICD-10-CM | POA: Diagnosis not present

## 2021-03-11 DIAGNOSIS — I4819 Other persistent atrial fibrillation: Secondary | ICD-10-CM

## 2021-03-11 DIAGNOSIS — I5032 Chronic diastolic (congestive) heart failure: Secondary | ICD-10-CM | POA: Diagnosis not present

## 2021-03-11 DIAGNOSIS — K5901 Slow transit constipation: Secondary | ICD-10-CM

## 2021-03-11 DIAGNOSIS — G9341 Metabolic encephalopathy: Secondary | ICD-10-CM | POA: Diagnosis not present

## 2021-03-11 DIAGNOSIS — I6601 Occlusion and stenosis of right middle cerebral artery: Secondary | ICD-10-CM | POA: Diagnosis not present

## 2021-03-11 DIAGNOSIS — S065X3S Traumatic subdural hemorrhage with loss of consciousness of 1 hour to 5 hours 59 minutes, sequela: Secondary | ICD-10-CM | POA: Diagnosis not present

## 2021-03-11 DIAGNOSIS — M6389 Disorders of muscle in diseases classified elsewhere, multiple sites: Secondary | ICD-10-CM | POA: Diagnosis not present

## 2021-03-11 DIAGNOSIS — R278 Other lack of coordination: Secondary | ICD-10-CM | POA: Diagnosis not present

## 2021-03-11 NOTE — Progress Notes (Addendum)
Location:   [facility] Room Number: 159 Place of Service:  SNF [phone]) Provider:  Fletcher Anon, NP  Mahlon Gammon, MD  Patient Care Team: Mahlon Gammon, MD as PCP - General (Internal Medicine) Laurey Morale, MD as PCP - Cardiology (Cardiology) Mia Creek, MD as Consulting Physician (Ophthalmology) Etta Grandchild, MD (Internal Medicine)  Extended Emergency Contact Information Primary Emergency Contact: Kae Heller Address: [address]          [place], [place] [address] [place] of [place] Phone: [phone] Relation: Spouse Secondary Emergency Contact: Phineas Inches, [place] [address] D[place] of [place] Home Phone: [phone] Relation: Son  Code Status:  DNR Goals of care: Advanced Directive information Advanced Directives 03/11/2021  Does Patient Have a Medical Advance Directive? Yes  Type of Estate agent of [place];Living will;Out of facility DNR (pink MOST or yellow form)  Does patient want to make changes to medical advance directive? No - Patient declined  Copy of Healthcare Power of Attorney in Chart? Yes - validated most recent copy scanned in chart (See row information)  Would patient like information on creating a medical advance directive? -  Pre-existing out of facility DNR order (yellow form or pink MOST form) -     Chief Complaint  Patient presents with   Acute Visit    follow up hypernatremia     HPI:  Pt is a 81 y.o. male seen today for an acute visit for f/u hypernatremia.   PMH includes syncope with fall in May of 2022, TBI with SDH with craniotomy, acute right MCA s/p thrombectomy, mitral and aortic valve replacement, DM II, and afib.  Hospitalized in Oct with enterococcal bacteremia with prosthetic valve endocarditis. Also had another CVA with septic emboli. Blood cx grew enterococcus faecalis and urine culture grew staph aureus. Treated with IV  antibioticsand now  he will take amoxicillin 500 mg bid indefinitely.   Ms. Lashley reports her husband is sleeping more the past couple of days. Intake is slightly decreased. Weight is down by a couple of lbs. Fluid intake is adequate. He had been struggling with hypernatremia and intermittently needed IVF but now his sodium is more stable 1/3 NA 141. They were considering a feeding tube if he did not have improvement in his NA.  He was started back on amantadine 100 mg bid on 12/19 to help with alertness s/p TBI. We have not noticed benefit. There was some concern that it was causing coarse tremors and was stopped in the past. Tremors have stopped and not returned after restarting the med. He remains on vimpat with no new seizure like activity.  He has had some issues with left shoulder pain and subluxation. He is followed by OT. They feel this is happening due to the left sided weakness he has from prior CVA. They are using a brace on the left side and chair support for proper positioning which is helping.   He had some loose stools on senna and miralax and these are being held. No abd pain, fever or foul odor. WBC ok.   He is having periodic spontaneous voiding and other times need I/O cath due to retention associated with BPH  Continues on amoxicillin due to prosthetic endocarditis. No current issues with chest pain, sob, fever. Blood cx x 2 12/21.    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    Other and  unspecified hyperlipidemia    Pre-diabetes    Previous back surgery 1978, jan 2007   Psychosexual dysfunction with inhibited sexual excitement    SOB (shortness of breath)    "with heavy exercise"   Stroke ([place]) 08/2013   "I WAS IN AFIB AND THREW A CLOT, THE EFFECTS WERE TRANSITORY AND DIDNT LAST BUT FOR 30 MINUTES"    Thoracic aortic aneurysm    Past Surgical History:  Procedure Laterality Date   AORTIC AND MITRAL VALVE REPLACEMENT     09/2004   CARDIOVERSION     3 times from 2004-2006   CARDIOVERSION N/A 09/26/2013   Procedure: CARDIOVERSION;  Surgeon: Larey Dresser, MD;  Location: [place];  Service: Cardiovascular;  Laterality: N/A;   CARDIOVERSION N/A 06/19/2014   Procedure: CARDIOVERSION;  Surgeon: Jerline Pain, MD;  Location: [place];  Service: Cardiovascular;  Laterality: N/A;   CARDIOVERSION N/A 04/24/2017   Procedure: CARDIOVERSION;  Surgeon: Larey Dresser, MD;  Location: [name];  Service: Cardiovascular;  Laterality: N/A;   CARDIOVERSION N/A 06/08/2017   Procedure: CARDIOVERSION;  Surgeon: Pixie Casino, MD;  Location[facility] ENDOSCOPY;  Service: Cardiovascular;  Laterality: N/A;   CARDIOVERSION N/A 08/24/2017   Procedure: CARDIOVERSION;  Surgeon: Skeet Latch, MD;  Location: [facility] ENDOSCOPY;  Service: Cardiovascular;  Laterality: N/A;   CARDIOVERSION N/A 02/14/2018   Procedure: CARDIOVERSION;  Surgeon: Larey Dresser, MD;  Location: [facility] ENDOSCOPY;  Service: Cardiovascular;  Laterality: N/A;   COLONOSCOPY     CRANIOTOMY N/A 07/28/2020   Procedure: CRANIOTOMY FOR EVACUATION OF SUBDURAL HEMATOMA;  Surgeon: Eustace Brittian, MD;  Location: [place];  Service: Neurosurgery;  Laterality: N/A;   IR ANGIO VERTEBRAL SEL SUBCLAVIAN INNOMINATE UNI R MOD SED  08/07/2020   IR CT HEAD LTD  08/07/2020   IR PERCUTANEOUS ART THROMBECTOMY/INFUSION INTRACRANIAL INC DIAG ANGIO  08/07/2020   laminectomies     10/1975 and in 03/2005   RADIOLOGY WITH ANESTHESIA N/A 08/07/2020    Procedure: IR WITH ANESTHESIA;  Surgeon: Luanne Bras, MD;  Location: [place];  Service: Radiology;  Laterality: N/A;   TEE WITHOUT CARDIOVERSION N/A 12/18/2020   Procedure: TRANSESOPHAGEAL ECHOCARDIOGRAM (TEE);  Surgeon: Larey Dresser, MD;  Location: [facility] ENDOSCOPY;  Service: Cardiovascular;  Laterality: N/A;   [place] Right 04/03/2018   Procedure: TOTAL HIP ARTHROPLASTY ANTERIOR APPROACH;  Surgeon: Paralee Cancel, MD;  Location: [facility];  Service: Orthopedics;  Laterality: Right;  70 mins    Allergies  Allergen Reactions   Naproxen Hives   No [facility] Food Allergies Other (See Comments)    Scallops - distress, nausea and vomitting    Allergies as of 03/11/2021       Reactions   Naproxen Hives   No [facility] Food Allergies Other (See Comments)   Scallops - distress, nausea and vomitting  Medication List        Accurate as of March 11, 2021  2:28 PM. If you have any questions, ask your nurse or doctor.          amantadine 100 MG capsule Commonly known as: SYMMETREL Take 100 mg by mouth 2 (two) times daily.   amLODipine 10 MG tablet Commonly known as: NORVASC Take 1 tablet (10 mg total) by mouth daily.   amoxicillin 400 MG/5ML suspension Commonly known as: AMOXIL Take 6.3 mLs (500 mg total) by mouth 2 (two) times daily. Please flavor.  Duration to be determined by infectious disease MD during outpatient follow-up.   apixaban 5 MG Tabs tablet Commonly known as: ELIQUIS Take 1 tablet (5 mg total) by mouth 2 (two) times daily.   atorvastatin 80 MG tablet Commonly known as: LIPITOR Take 1 tablet (80 mg total) by mouth daily.   BIOTENE MOISTURIZING MOUTH MT Use as directed 2 sprays in the mouth or throat in the morning, at noon, and at bedtime.   bisacodyl 10 MG suppository Commonly known as: DULCOLAX Place 1 suppository (10 mg total) rectally daily as needed for moderate constipation or mild constipation.    clobetasol cream 0.05 % Commonly known as: TEMOVATE Apply 1 application topically daily as needed (for skin irritation).   diphenhydramine-acetaminophen 25-500 MG Tabs tablet Commonly known as: TYLENOL PM Take 1 tablet by mouth at bedtime as needed.   dofetilide 125 MCG capsule Commonly known as: TIKOSYN Take 3 capsules (375 mcg total) by mouth 2 (two) times daily.   famotidine 20 MG tablet Commonly known as: PEPCID Take 20 mg by mouth at bedtime as needed for indigestion.   finasteride 5 MG tablet Commonly known as: PROSCAR Take 1 tablet (5 mg total) by mouth daily.   hydrocortisone cream 1 % Apply topically 2 (two) times daily as needed for itching (rash).   lacosamide 50 MG Tabs tablet Commonly known as: VIMPAT Take 1 tablet (50 mg total) by mouth 2 (two) times daily.   levalbuterol 0.63 MG/3ML nebulizer solution Commonly known as: XOPENEX Take 0.63 mg by nebulization every 6 (six) hours as needed for wheezing or shortness of breath.   magnesium gluconate 500 MG tablet Commonly known as: MAGONATE Take 1 tablet (500 mg total) by mouth daily.   methenamine 1 g tablet Commonly known as: HIPREX Take 1 g by mouth 2 (two) times daily with a meal.   multivitamin with minerals Tabs tablet Take 1 tablet by mouth daily.   polyethylene glycol 17 g packet Commonly known as: MIRALAX / GLYCOLAX Take 17 g by mouth daily.   potassium chloride SA 20 MEQ tablet Commonly known as: KLOR-CON M Take 1 tablet (20 mEq total) by mouth daily.   senna-docusate 8.6-50 MG tablet Commonly known as: Senokot-S Take 1 tablet by mouth 2 (two) times daily.   Systane 0.4-0.3 % Soln Generic drug: Polyethyl Glycol-Propyl Glycol Place 2 drops into both eyes daily.   torsemide 20 MG tablet Commonly known as: DEMADEX Take 20 mg by mouth daily as needed.        Review of Systems  Unable to perform ROS: Patient nonverbal   Immunization History  Administered Date(s) Administered    Fluad Quad(high Dose 65+) 12/06/2018, 12/09/2020   Influenza Whole 12/25/2008, 01/05/2010, 12/30/2011   Influenza, High Dose Seasonal PF 12/14/2016   Influenza,inj,Quad PF,6+ Mos 12/19/2013   Influenza,inj,quad, With Preservative 11/22/2017   Influenza-Unspecified 12/14/2015, 12/24/2019   Moderna Covid-19 Vaccine Bivalent Booster 53yrs & up  01/12/2021   Moderna SARS-COV2 Booster Vaccination 01/02/2020, 06/26/2020   Moderna Sars-Covid-2 Vaccination 04/08/2019, 05/05/2019   Pneumococcal Conjugate-13 12/19/2013   Pneumococcal Polysaccharide-23 09/24/2007, 05/22/2017   Td 12/25/2008   Tdap 03/22/2018   Zoster Recombinat (Shingrix) 07/16/2019, 10/28/2019   Zoster, Live 09/24/2007   Pertinent  Health Maintenance Due  Topic Date Due   FOOT EXAM  Never done   URINE MICROALBUMIN  Never done   OPHTHALMOLOGY EXAM  04/07/2021 (Originally 10/04/1950)   HEMOGLOBIN A1C  06/17/2021   INFLUENZA VACCINE  Completed   COLONOSCOPY (Pts 45-94yrs Insurance coverage will need to be confirmed)  Discontinued   Fall Risk 01/26/2021 01/26/2021 01/26/2021 01/27/2021 02/24/2021  Falls in the past year? - - - - 0  Was there an injury with Fall? - - - - 0  Fall Risk Category Calculator - - - - 0  Fall Risk Category - - - - Low  Patient Fall Risk Level High fall risk High fall risk High fall risk High fall risk High fall risk  Patient at Risk for Falls Due to - - - - Impaired mobility  Patient at Risk for Falls Due to - - - - -  Fall risk Follow up - - - - Falls evaluation completed   Functional Status Survey:    Vitals:   03/11/21 1321  BP: 103/66  Pulse: 86  Resp: 14  Temp: 97.9 F (36.6 C)  SpO2: 98%  Weight: 146 lb 6.4 oz (66.4 kg)  Height: $Remove'5\' 7"'gHRgDVo$  (1.702 m)   Body mass index is 22.93 kg/m. Physical Exam Vitals and nursing note reviewed.  Constitutional:      General: He is not in acute distress.    Appearance: He is not diaphoretic.     Comments: Lethargic but arouses to verbal stim  HENT:      Head: Normocephalic and atraumatic.     Mouth/Throat:     Mouth: Mucous membranes are moist.     Pharynx: Oropharynx is clear.  Eyes:     Conjunctiva/sclera: Conjunctivae normal.     Pupils: Pupils are equal, round, and reactive to light.  Neck:     Thyroid: No thyromegaly.     Vascular: No JVD.     Trachea: No tracheal deviation.  Cardiovascular:     Rate and Rhythm: Normal rate and regular rhythm.     Heart sounds: No murmur heard. Pulmonary:     Effort: Pulmonary effort is normal. No respiratory distress.     Breath sounds: Normal breath sounds. No wheezing.  Abdominal:     General: Bowel sounds are normal. There is no distension.     Palpations: Abdomen is soft.     Tenderness: There is no abdominal tenderness.  Musculoskeletal:     Right lower leg: No edema.     Left lower leg: No edema.  Lymphadenopathy:     Cervical: No cervical adenopathy.  Skin:    General: Skin is warm and dry.     Coloration: Skin is pale.  Neurological:     Comments: Left sided weakness. Left arm contracture. Left facial droop. Able to f/c. Minimal verbalization     Labs reviewed: Recent Labs    12/18/20 1630 12/19/20 0610 12/19/20 1632 12/20/20 0153 01/25/21 0233 01/25/21 0853 01/26/21 0435 01/27/21 0538 02/01/21 0000 02/08/21 0000 02/09/21 0000 03/01/21 0000 03/04/21 0000 03/09/21 0000  NA  --  139  --    < > 143  --  143 140   < >  150*   < > 142 139 141  K  --  3.5  --    < > 3.6  --  3.8 3.5   < > 3.7   < > 4.0 4.1 4.0  CL  --  108  --    < > 107  --  104 107   < > 107   < > 108 102 102  CO2  --  23  --    < > 27  --  26 25   < > 22   < > 22 22 23*  GLUCOSE  --  186*  --    < > 93  --  105* 137*  --   --   --   --   --   --   BUN  --  16  --    < > 11  --  12 15   < > 23*   < > $R'10 8 13  'cc$ CREATININE  --  0.76  --    < > 0.94  --  0.95 1.06   < > 0.9   < > 0.6 0.6 0.6  CALCIUM  --  7.7*  --    < > 8.2*  --  8.7* 8.7*   < > 9.9   < > 9.1 9.4 9.1  MG 1.9 2.0 1.9   < >  --    <  > 2.2 2.4  --  2.1  --   --   --   --   PHOS 3.1 2.7 2.4*  --   --   --   --   --   --   --   --   --   --   --    < > = values in this interval not displayed.   Recent Labs    01/03/21 0221 01/08/21 0000 01/20/21 1631 01/23/21 1355 02/08/21 0000 02/22/21 0000 02/23/21 0000 03/09/21 0000  AST 32   < > 27 37   < > 33 32 23  ALT 63*   < > 35 34   < > 36 35 23  ALKPHOS 94   < > 115 107   < > 226* 244* 230*  BILITOT 0.4  --  0.6 0.7  --   --   --   --   PROT 6.3*  --  6.9 6.9  --   --   --   --   ALBUMIN 1.9*   < > 2.5* 2.4*   < > 3.3* 3.5 3.6   < > = values in this interval not displayed.   Recent Labs    08/16/20 0427 08/17/20 0340 12/14/20 1245 12/14/20 1504 12/16/20 0130 12/18/20 0221 01/24/21 0154 01/25/21 0623 01/26/21 0435 02/01/21 0000 02/05/21 1111 02/08/21 0000 02/22/21 0000 03/04/21 0000 03/09/21 0000  WBC 7.9   < > 12.0*   < > 10.7*   < > 10.0  --  10.4   < > 9.8   < > 9.6 10.1 9.7  NEUTROABS 5.7  --  9.3*  --  7.7  --   --   --   --   --   --   --   --   --   --   HGB 9.0*   < > 11.7*   < > 9.9*   < > 7.3*   < > 7.9*   < > 9.8*   < > 9.5* 10.6* 10.2*  HCT 28.2*   < > 36.8*   < > 30.7*   < > 23.3*   < > 25.9*   < > 31.8*   < > 31* 34* 32*  MCV 98.6   < > 90.0   < > 88.0   < > 88.3  --  88.7  --  85.0  --   --   --   --   PLT 177   < > 119*   < > 106*   < > 395  --  417*   < > 462*   < > 272 313 285   < > = values in this interval not displayed.   Lab Results  Component Value Date   TSH 1.773 01/24/2021   Lab Results  Component Value Date   HGBA1C 7.3 (H) 12/17/2020   Lab Results  Component Value Date   CHOL 119 08/08/2020   HDL 42 08/08/2020   LDLCALC 56 08/08/2020   TRIG 105 08/08/2020   CHOLHDL 2.8 08/08/2020    Significant Diagnostic Results in last 30 days:  No results found.  Assessment/Plan  1. Lethargy No improvement No sign of infection or other complaints.  This is a continual concern and likely apart of his TBI Will continue  amantadine for now.  Follows with neurology   2. Hypernatremia Improved.   3. 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   4. 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   5. Slow transit constipation Change senna to prn due to loose stools.   6. Persistent atrial fibrillation ([place]) Rate is regular on exam today Continues on tikosyn and eliquis.   7. Controlled type 2 diabetes mellitus with complication, without long-term current use of insulin (HCC) Diet controlled.   8. Urinary retention due to benign prostatic hyperplasia Continue proscar Continue I and O cath if no voiding.  Follows with urology   9. Prosthetic valve endocarditis, subsequent encounter Blood cx negative F/U echo in two weeks Continues on Amoxicillin ALP elevated and ID suggest we continue to monitor this. No acute symptoms associated.    Family/ staff Communication: discussed with Herbert Pun  Labs/tests ordered:   BMP weekly x 6, ESR CRP per request with labs prior to echo

## 2021-03-12 ENCOUNTER — Encounter: Payer: Self-pay | Admitting: Adult Health

## 2021-03-12 DIAGNOSIS — I6601 Occlusion and stenosis of right middle cerebral artery: Secondary | ICD-10-CM | POA: Diagnosis not present

## 2021-03-12 DIAGNOSIS — I69354 Hemiplegia and hemiparesis following cerebral infarction affecting left non-dominant side: Secondary | ICD-10-CM | POA: Diagnosis not present

## 2021-03-12 DIAGNOSIS — I5032 Chronic diastolic (congestive) heart failure: Secondary | ICD-10-CM | POA: Diagnosis not present

## 2021-03-12 DIAGNOSIS — R41841 Cognitive communication deficit: Secondary | ICD-10-CM | POA: Diagnosis not present

## 2021-03-12 DIAGNOSIS — I69391 Dysphagia following cerebral infarction: Secondary | ICD-10-CM | POA: Diagnosis not present

## 2021-03-15 DIAGNOSIS — I69354 Hemiplegia and hemiparesis following cerebral infarction affecting left non-dominant side: Secondary | ICD-10-CM | POA: Diagnosis not present

## 2021-03-15 DIAGNOSIS — R293 Abnormal posture: Secondary | ICD-10-CM | POA: Diagnosis not present

## 2021-03-15 DIAGNOSIS — G9341 Metabolic encephalopathy: Secondary | ICD-10-CM | POA: Diagnosis not present

## 2021-03-15 DIAGNOSIS — I6601 Occlusion and stenosis of right middle cerebral artery: Secondary | ICD-10-CM | POA: Diagnosis not present

## 2021-03-15 DIAGNOSIS — I38 Endocarditis, valve unspecified: Secondary | ICD-10-CM | POA: Diagnosis not present

## 2021-03-15 DIAGNOSIS — I5022 Chronic systolic (congestive) heart failure: Secondary | ICD-10-CM | POA: Diagnosis not present

## 2021-03-15 DIAGNOSIS — R41840 Attention and concentration deficit: Secondary | ICD-10-CM | POA: Diagnosis not present

## 2021-03-15 DIAGNOSIS — I69391 Dysphagia following cerebral infarction: Secondary | ICD-10-CM | POA: Diagnosis not present

## 2021-03-15 DIAGNOSIS — M6389 Disorders of muscle in diseases classified elsewhere, multiple sites: Secondary | ICD-10-CM | POA: Diagnosis not present

## 2021-03-15 DIAGNOSIS — I69812 Visuospatial deficit and spatial neglect following other cerebrovascular disease: Secondary | ICD-10-CM | POA: Diagnosis not present

## 2021-03-15 DIAGNOSIS — I5032 Chronic diastolic (congestive) heart failure: Secondary | ICD-10-CM | POA: Diagnosis not present

## 2021-03-15 DIAGNOSIS — R41841 Cognitive communication deficit: Secondary | ICD-10-CM | POA: Diagnosis not present

## 2021-03-15 DIAGNOSIS — S065X3S Traumatic subdural hemorrhage with loss of consciousness of 1 hour to 5 hours 59 minutes, sequela: Secondary | ICD-10-CM | POA: Diagnosis not present

## 2021-03-15 DIAGNOSIS — R278 Other lack of coordination: Secondary | ICD-10-CM | POA: Diagnosis not present

## 2021-03-15 DIAGNOSIS — H538 Other visual disturbances: Secondary | ICD-10-CM | POA: Diagnosis not present

## 2021-03-15 LAB — BASIC METABOLIC PANEL
BUN: 13 (ref 4–21)
CO2: 23 — AB (ref 13–22)
Chloride: 105 (ref 99–108)
Creatinine: 0.6 (ref 0.6–1.3)
Glucose: 93
Potassium: 3.5 (ref 3.4–5.3)
Sodium: 138 (ref 137–147)

## 2021-03-15 LAB — COMPREHENSIVE METABOLIC PANEL: Calcium: 8.9 (ref 8.7–10.7)

## 2021-03-16 DIAGNOSIS — R293 Abnormal posture: Secondary | ICD-10-CM | POA: Diagnosis not present

## 2021-03-16 DIAGNOSIS — R41841 Cognitive communication deficit: Secondary | ICD-10-CM | POA: Diagnosis not present

## 2021-03-16 DIAGNOSIS — I69812 Visuospatial deficit and spatial neglect following other cerebrovascular disease: Secondary | ICD-10-CM | POA: Diagnosis not present

## 2021-03-16 DIAGNOSIS — G9341 Metabolic encephalopathy: Secondary | ICD-10-CM | POA: Diagnosis not present

## 2021-03-16 DIAGNOSIS — I5032 Chronic diastolic (congestive) heart failure: Secondary | ICD-10-CM | POA: Diagnosis not present

## 2021-03-16 DIAGNOSIS — H538 Other visual disturbances: Secondary | ICD-10-CM | POA: Diagnosis not present

## 2021-03-16 DIAGNOSIS — R41840 Attention and concentration deficit: Secondary | ICD-10-CM | POA: Diagnosis not present

## 2021-03-16 DIAGNOSIS — I5022 Chronic systolic (congestive) heart failure: Secondary | ICD-10-CM | POA: Diagnosis not present

## 2021-03-16 DIAGNOSIS — I69391 Dysphagia following cerebral infarction: Secondary | ICD-10-CM | POA: Diagnosis not present

## 2021-03-16 DIAGNOSIS — R278 Other lack of coordination: Secondary | ICD-10-CM | POA: Diagnosis not present

## 2021-03-16 DIAGNOSIS — I6601 Occlusion and stenosis of right middle cerebral artery: Secondary | ICD-10-CM | POA: Diagnosis not present

## 2021-03-16 DIAGNOSIS — I69354 Hemiplegia and hemiparesis following cerebral infarction affecting left non-dominant side: Secondary | ICD-10-CM | POA: Diagnosis not present

## 2021-03-16 DIAGNOSIS — S065X3S Traumatic subdural hemorrhage with loss of consciousness of 1 hour to 5 hours 59 minutes, sequela: Secondary | ICD-10-CM | POA: Diagnosis not present

## 2021-03-16 DIAGNOSIS — M6389 Disorders of muscle in diseases classified elsewhere, multiple sites: Secondary | ICD-10-CM | POA: Diagnosis not present

## 2021-03-17 DIAGNOSIS — R278 Other lack of coordination: Secondary | ICD-10-CM | POA: Diagnosis not present

## 2021-03-17 DIAGNOSIS — M6389 Disorders of muscle in diseases classified elsewhere, multiple sites: Secondary | ICD-10-CM | POA: Diagnosis not present

## 2021-03-17 DIAGNOSIS — I5022 Chronic systolic (congestive) heart failure: Secondary | ICD-10-CM | POA: Diagnosis not present

## 2021-03-17 DIAGNOSIS — S065X3S Traumatic subdural hemorrhage with loss of consciousness of 1 hour to 5 hours 59 minutes, sequela: Secondary | ICD-10-CM | POA: Diagnosis not present

## 2021-03-17 DIAGNOSIS — I6601 Occlusion and stenosis of right middle cerebral artery: Secondary | ICD-10-CM | POA: Diagnosis not present

## 2021-03-17 DIAGNOSIS — G9341 Metabolic encephalopathy: Secondary | ICD-10-CM | POA: Diagnosis not present

## 2021-03-17 DIAGNOSIS — I5032 Chronic diastolic (congestive) heart failure: Secondary | ICD-10-CM | POA: Diagnosis not present

## 2021-03-17 DIAGNOSIS — H538 Other visual disturbances: Secondary | ICD-10-CM | POA: Diagnosis not present

## 2021-03-17 DIAGNOSIS — I69391 Dysphagia following cerebral infarction: Secondary | ICD-10-CM | POA: Diagnosis not present

## 2021-03-17 DIAGNOSIS — R41841 Cognitive communication deficit: Secondary | ICD-10-CM | POA: Diagnosis not present

## 2021-03-17 DIAGNOSIS — R41840 Attention and concentration deficit: Secondary | ICD-10-CM | POA: Diagnosis not present

## 2021-03-17 DIAGNOSIS — I69812 Visuospatial deficit and spatial neglect following other cerebrovascular disease: Secondary | ICD-10-CM | POA: Diagnosis not present

## 2021-03-17 DIAGNOSIS — R293 Abnormal posture: Secondary | ICD-10-CM | POA: Diagnosis not present

## 2021-03-17 DIAGNOSIS — I69354 Hemiplegia and hemiparesis following cerebral infarction affecting left non-dominant side: Secondary | ICD-10-CM | POA: Diagnosis not present

## 2021-03-18 DIAGNOSIS — R41841 Cognitive communication deficit: Secondary | ICD-10-CM | POA: Diagnosis not present

## 2021-03-18 DIAGNOSIS — I6601 Occlusion and stenosis of right middle cerebral artery: Secondary | ICD-10-CM | POA: Diagnosis not present

## 2021-03-18 DIAGNOSIS — S065X3S Traumatic subdural hemorrhage with loss of consciousness of 1 hour to 5 hours 59 minutes, sequela: Secondary | ICD-10-CM | POA: Diagnosis not present

## 2021-03-18 DIAGNOSIS — R41840 Attention and concentration deficit: Secondary | ICD-10-CM | POA: Diagnosis not present

## 2021-03-18 DIAGNOSIS — I69391 Dysphagia following cerebral infarction: Secondary | ICD-10-CM | POA: Diagnosis not present

## 2021-03-18 DIAGNOSIS — I69354 Hemiplegia and hemiparesis following cerebral infarction affecting left non-dominant side: Secondary | ICD-10-CM | POA: Diagnosis not present

## 2021-03-18 DIAGNOSIS — H538 Other visual disturbances: Secondary | ICD-10-CM | POA: Diagnosis not present

## 2021-03-18 DIAGNOSIS — R278 Other lack of coordination: Secondary | ICD-10-CM | POA: Diagnosis not present

## 2021-03-18 DIAGNOSIS — G9341 Metabolic encephalopathy: Secondary | ICD-10-CM | POA: Diagnosis not present

## 2021-03-18 DIAGNOSIS — I69812 Visuospatial deficit and spatial neglect following other cerebrovascular disease: Secondary | ICD-10-CM | POA: Diagnosis not present

## 2021-03-18 DIAGNOSIS — R293 Abnormal posture: Secondary | ICD-10-CM | POA: Diagnosis not present

## 2021-03-18 DIAGNOSIS — I5022 Chronic systolic (congestive) heart failure: Secondary | ICD-10-CM | POA: Diagnosis not present

## 2021-03-18 DIAGNOSIS — M6389 Disorders of muscle in diseases classified elsewhere, multiple sites: Secondary | ICD-10-CM | POA: Diagnosis not present

## 2021-03-18 DIAGNOSIS — I5032 Chronic diastolic (congestive) heart failure: Secondary | ICD-10-CM | POA: Diagnosis not present

## 2021-03-19 DIAGNOSIS — G9341 Metabolic encephalopathy: Secondary | ICD-10-CM | POA: Diagnosis not present

## 2021-03-19 DIAGNOSIS — I6601 Occlusion and stenosis of right middle cerebral artery: Secondary | ICD-10-CM | POA: Diagnosis not present

## 2021-03-19 DIAGNOSIS — I69354 Hemiplegia and hemiparesis following cerebral infarction affecting left non-dominant side: Secondary | ICD-10-CM | POA: Diagnosis not present

## 2021-03-19 DIAGNOSIS — I5032 Chronic diastolic (congestive) heart failure: Secondary | ICD-10-CM | POA: Diagnosis not present

## 2021-03-19 DIAGNOSIS — I69812 Visuospatial deficit and spatial neglect following other cerebrovascular disease: Secondary | ICD-10-CM | POA: Diagnosis not present

## 2021-03-19 DIAGNOSIS — M6389 Disorders of muscle in diseases classified elsewhere, multiple sites: Secondary | ICD-10-CM | POA: Diagnosis not present

## 2021-03-19 DIAGNOSIS — R293 Abnormal posture: Secondary | ICD-10-CM | POA: Diagnosis not present

## 2021-03-19 DIAGNOSIS — R41841 Cognitive communication deficit: Secondary | ICD-10-CM | POA: Diagnosis not present

## 2021-03-19 DIAGNOSIS — H538 Other visual disturbances: Secondary | ICD-10-CM | POA: Diagnosis not present

## 2021-03-19 DIAGNOSIS — I5022 Chronic systolic (congestive) heart failure: Secondary | ICD-10-CM | POA: Diagnosis not present

## 2021-03-19 DIAGNOSIS — I69391 Dysphagia following cerebral infarction: Secondary | ICD-10-CM | POA: Diagnosis not present

## 2021-03-19 DIAGNOSIS — R41840 Attention and concentration deficit: Secondary | ICD-10-CM | POA: Diagnosis not present

## 2021-03-19 DIAGNOSIS — R278 Other lack of coordination: Secondary | ICD-10-CM | POA: Diagnosis not present

## 2021-03-19 DIAGNOSIS — S065X3S Traumatic subdural hemorrhage with loss of consciousness of 1 hour to 5 hours 59 minutes, sequela: Secondary | ICD-10-CM | POA: Diagnosis not present

## 2021-03-22 ENCOUNTER — Non-Acute Institutional Stay (SKILLED_NURSING_FACILITY): Payer: Medicare Other | Admitting: Internal Medicine

## 2021-03-22 ENCOUNTER — Encounter: Payer: Self-pay | Admitting: Internal Medicine

## 2021-03-22 DIAGNOSIS — N401 Enlarged prostate with lower urinary tract symptoms: Secondary | ICD-10-CM | POA: Diagnosis not present

## 2021-03-22 DIAGNOSIS — E870 Hyperosmolality and hypernatremia: Secondary | ICD-10-CM | POA: Diagnosis not present

## 2021-03-22 DIAGNOSIS — I5022 Chronic systolic (congestive) heart failure: Secondary | ICD-10-CM | POA: Diagnosis not present

## 2021-03-22 DIAGNOSIS — I38 Endocarditis, valve unspecified: Secondary | ICD-10-CM | POA: Diagnosis not present

## 2021-03-22 DIAGNOSIS — M6389 Disorders of muscle in diseases classified elsewhere, multiple sites: Secondary | ICD-10-CM | POA: Diagnosis not present

## 2021-03-22 DIAGNOSIS — R278 Other lack of coordination: Secondary | ICD-10-CM | POA: Diagnosis not present

## 2021-03-22 DIAGNOSIS — I4819 Other persistent atrial fibrillation: Secondary | ICD-10-CM | POA: Diagnosis not present

## 2021-03-22 DIAGNOSIS — R5383 Other fatigue: Secondary | ICD-10-CM

## 2021-03-22 DIAGNOSIS — R338 Other retention of urine: Secondary | ICD-10-CM | POA: Diagnosis not present

## 2021-03-22 DIAGNOSIS — E118 Type 2 diabetes mellitus with unspecified complications: Secondary | ICD-10-CM | POA: Diagnosis not present

## 2021-03-22 DIAGNOSIS — G9341 Metabolic encephalopathy: Secondary | ICD-10-CM | POA: Diagnosis not present

## 2021-03-22 DIAGNOSIS — R41840 Attention and concentration deficit: Secondary | ICD-10-CM | POA: Diagnosis not present

## 2021-03-22 DIAGNOSIS — I69354 Hemiplegia and hemiparesis following cerebral infarction affecting left non-dominant side: Secondary | ICD-10-CM

## 2021-03-22 DIAGNOSIS — I69391 Dysphagia following cerebral infarction: Secondary | ICD-10-CM | POA: Diagnosis not present

## 2021-03-22 DIAGNOSIS — T826XXD Infection and inflammatory reaction due to cardiac valve prosthesis, subsequent encounter: Secondary | ICD-10-CM | POA: Diagnosis not present

## 2021-03-22 DIAGNOSIS — R293 Abnormal posture: Secondary | ICD-10-CM | POA: Diagnosis not present

## 2021-03-22 DIAGNOSIS — I5032 Chronic diastolic (congestive) heart failure: Secondary | ICD-10-CM

## 2021-03-22 DIAGNOSIS — I69812 Visuospatial deficit and spatial neglect following other cerebrovascular disease: Secondary | ICD-10-CM | POA: Diagnosis not present

## 2021-03-22 DIAGNOSIS — H538 Other visual disturbances: Secondary | ICD-10-CM | POA: Diagnosis not present

## 2021-03-22 DIAGNOSIS — S069X3S Unspecified intracranial injury with loss of consciousness of 1 hour to 5 hours 59 minutes, sequela: Secondary | ICD-10-CM | POA: Diagnosis not present

## 2021-03-22 DIAGNOSIS — I6601 Occlusion and stenosis of right middle cerebral artery: Secondary | ICD-10-CM | POA: Diagnosis not present

## 2021-03-22 DIAGNOSIS — S065X3S Traumatic subdural hemorrhage with loss of consciousness of 1 hour to 5 hours 59 minutes, sequela: Secondary | ICD-10-CM | POA: Diagnosis not present

## 2021-03-22 DIAGNOSIS — R41841 Cognitive communication deficit: Secondary | ICD-10-CM | POA: Diagnosis not present

## 2021-03-22 NOTE — Progress Notes (Addendum)
Location:   [place] Room Number: 867 Place of Service:  SNF [phone]) Provider:  Veleta Miners, MD  [name], MD  Patient Care Team: [name], MD as PCP - General (Internal Medicine) Larey Dresser, MD as PCP - Cardiology (Cardiology) [name], MD as Consulting Physician (Ophthalmology) [name], MD (Internal Medicine)  Extended Emergency Contact Information Primary Emergency Contact: [name] Address: [place], [place] [address] [name] of [place] Phone: [phone] Relation: Spouse Secondary Emergency Contact: [name], [name] [address] [name] of [place] Phone: [phone] Relation: Son  Code Status:  DNR Goals of care: Advanced Directive information Advanced Directives 03/11/2021  Does Patient Have a Medical Advance Directive? Yes  Type of Paramedic of [place];Living will;Out of facility DNR (pink MOST or yellow form)  Does patient want to make changes to medical advance directive? No - Patient declined  Copy of [place] in Chart? Yes - validated most recent copy scanned in chart (See row information)  Would patient like information on creating a medical advance directive? -  Pre-existing out of facility DNR order (yellow form or pink MOST form) -     Chief Complaint  Patient presents with   Acute Visit    Excessive sleepiness   Health Maintenance    Foot exam,urine microalbumin, COVID booster    HPI:  [name] [name] is a 81 y.o. 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  Since then with help of the nurses and the and wife patient has been able to maintain his p.o. intake and his sodium has been staying stable. His only issue right now is excessive sleepiness.  Also is not vocal anymore.  Per wife it sometimes takes her 15 to 20 minutes to wake him up.  He does respond to her but then closes his eyes again. Since then amantadine was started he has also started having some jerking in his legs and hands but they are very mild.  And does not seem to bother him. Wt Readings from Last 3 Encounters:  03/22/21 143 lb 12.8 oz (65.2 kg)  03/11/21 146 lb 6.4 oz (66.4 kg)  02/25/21 153 lb 12.8 oz (69.8 kg)    His weight is staying stable.  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    Other and unspecified hyperlipidemia    Pre-diabetes    Previous back surgery 1978, jan 2007   Psychosexual dysfunction with inhibited sexual excitement    SOB (shortness of breath)    "with heavy exercise"   Stroke ([place]) 08/2013   "I WAS IN AFIB AND THREW A CLOT, THE EFFECTS WERE TRANSITORY AND DIDNT LAST BUT FOR 30 MINUTES"    Thoracic aortic  aneurysm    Past Surgical History:  Procedure Laterality Date   AORTIC AND MITRAL VALVE REPLACEMENT     09/2004   CARDIOVERSION     3 times from 2004-2006   CARDIOVERSION N/A 09/26/2013   Procedure: CARDIOVERSION;  Surgeon: Larey Dresser, MD;  Location: [place];  Service: Cardiovascular;  Laterality: N/A;   CARDIOVERSION N/A 06/19/2014   Procedure: CARDIOVERSION;  Surgeon: Jerline Pain, MD;  Location: [facility] [place];  Service: Cardiovascular;  Laterality: N/A;   CARDIOVERSION N/A 04/24/2017   Procedure: CARDIOVERSION;  Surgeon: Larey Dresser, MD;  Location: [place];  Service: Cardiovascular;  Laterality: N/A;   CARDIOVERSION N/A 06/08/2017   Procedure: CARDIOVERSION;  Surgeon: Pixie Casino, MD;  Location: [facility] ENDOSCOPY;  Service: Cardiovascular;  Laterality: N/A;   CARDIOVERSION N/A 08/24/2017   Procedure: CARDIOVERSION;  Surgeon: Skeet Latch, MD;  Location: [facility] ENDOSCOPY;  Service: Cardiovascular;  Laterality: N/A;   CARDIOVERSION N/A 02/14/2018   Procedure: CARDIOVERSION;  Surgeon: Larey Dresser, MD;  Location: [facility] ENDOSCOPY;  Service: Cardiovascular;  Laterality: N/A;   COLONOSCOPY     CRANIOTOMY N/A 07/28/2020   Procedure: CRANIOTOMY FOR EVACUATION OF SUBDURAL HEMATOMA;  Surgeon: Eustace Funaro, MD;  Location: [place];  Service: Neurosurgery;  Laterality: N/A;   IR ANGIO VERTEBRAL SEL SUBCLAVIAN INNOMINATE UNI R MOD SED  08/07/2020   IR CT HEAD LTD  08/07/2020   IR PERCUTANEOUS ART THROMBECTOMY/INFUSION INTRACRANIAL INC DIAG ANGIO  08/07/2020   laminectomies     10/1975 and in 03/2005   RADIOLOGY WITH ANESTHESIA N/A 08/07/2020   Procedure: IR WITH ANESTHESIA;  Surgeon: Luanne Bras, MD;  Location: [facility];  Service: Radiology;  Laterality: N/A;   TEE WITHOUT CARDIOVERSION N/A 12/18/2020   Procedure: TRANSESOPHAGEAL ECHOCARDIOGRAM (TEE);  Surgeon: Larey Dresser, MD;  Location: [facility] ENDOSCOPY;  Service: Cardiovascular;  Laterality: N/A;   [place] Right 04/03/2018   Procedure: TOTAL HIP ARTHROPLASTY ANTERIOR APPROACH;  Surgeon: Paralee Cancel, MD;  Location: [facility];  Service: Orthopedics;  Laterality: Right;  70 mins    Allergies  Allergen Reactions   Naproxen Hives   No [facility] Food Allergies Other (See Comments)    Scallops - distress, nausea and vomitting    Allergies as of 03/22/2021       Reactions   Naproxen Hives   No [facility] Food Allergies Other (See Comments)   Scallops - distress, nausea and vomitting        Medication List        Accurate as of March 22, 2021  4:47 PM. If you have any questions, ask your nurse or doctor.          amantadine 100 MG capsule Commonly known as: SYMMETREL Take 100 mg by mouth 2 (two) times daily.   amLODipine 10 MG tablet  Commonly known as: NORVASC Take 1 tablet (10 mg total) by mouth daily.   amoxicillin 400 MG/5ML suspension Commonly known as: AMOXIL Take 6.3 mLs (500 mg total) by mouth 2 (two) times daily. Please flavor.  Duration to be determined by infectious disease MD during outpatient follow-up.   apixaban 5 MG Tabs tablet Commonly known as: ELIQUIS Take 1 tablet (5 mg total) by mouth 2 (two) times daily.   atorvastatin 80 MG tablet Commonly known as: LIPITOR Take 1 tablet (80 mg total) by mouth daily.   BIOTENE DRY MOUTH MT Use as directed 2 sprays in the mouth or throat 3 (three) times daily.   BIOTENE MOISTURIZING MOUTH MT Use as directed 2 sprays in the mouth or throat in the morning, at noon, and at bedtime.   bisacodyl 10 MG suppository Commonly known as: DULCOLAX Place 1 suppository (10 mg total) rectally daily as needed for moderate constipation or mild constipation.   clobetasol cream 0.05 % Commonly known as: TEMOVATE Apply 1 application topically daily as needed (for skin irritation).   diphenhydramine-acetaminophen 25-500 MG Tabs tablet Commonly known as: TYLENOL PM Take 1 tablet by mouth at bedtime as  needed.   dofetilide 125 MCG capsule Commonly known as: TIKOSYN Take 3 capsules (375 mcg total) by mouth 2 (two) times daily.   famotidine 20 MG tablet Commonly known as: PEPCID Take 20 mg by mouth at bedtime as needed for indigestion.   finasteride 5 MG tablet Commonly known as: PROSCAR Take 1 tablet (5 mg total) by mouth daily.   hydrocortisone cream 1 % Apply topically 2 (two) times daily as needed for itching (rash).   lacosamide 50 MG Tabs tablet Commonly known as: VIMPAT Take 1 tablet (50 mg total) by mouth 2 (two) times daily.   levalbuterol 0.63 MG/3ML nebulizer solution Commonly known as: XOPENEX Take 0.63 mg by nebulization every 6 (six) hours as needed for wheezing or shortness of breath.   magnesium gluconate 500 MG tablet Commonly known as: MAGONATE Take 1 tablet (500 mg total) by mouth daily.   melatonin 5 MG Tabs Take 5 mg by mouth at bedtime.   methenamine 1 g tablet Commonly known as: HIPREX Take 1 g by mouth 2 (two) times daily with a meal.   multivitamin with minerals Tabs tablet Take 1 tablet by mouth daily.   polyethylene glycol 17 g packet Commonly known as: MIRALAX / GLYCOLAX Take 17 g by mouth daily.   potassium chloride 10 MEQ tablet Commonly known as: KLOR-CON M Take 10 mEq by mouth daily. Two tablets daily What changed: Another medication with the same name was removed. Continue taking this medication, and follow the directions you see here. Changed by: [name], MD   senna-docusate 8.6-50 MG tablet Commonly known as: Senokot-S Take 1 tablet by mouth at bedtime as needed.   Systane 0.4-0.3 % Soln Generic drug: Polyethyl Glycol-Propyl Glycol Place 2 drops into both eyes daily.   torsemide 20 MG tablet Commonly known as: DEMADEX Take 20 mg by mouth daily as needed.        Review of Systems  Unable to perform ROS: Patient nonverbal   Immunization History  Administered Date(s) Administered   Fluad Quad(high Dose 65+)  12/06/2018, 12/09/2020   Influenza Whole 12/25/2008, 01/05/2010, 12/30/2011   Influenza, High Dose Seasonal PF 12/14/2016   Influenza,inj,Quad PF,6+ Mos 12/19/2013   Influenza,inj,quad, With Preservative 11/22/2017   Influenza-Unspecified 12/14/2015, 12/24/2019   Moderna Covid-19 Vaccine Bivalent Booster 62yr & up 01/12/2021  Moderna SARS-COV2 Booster Vaccination 01/02/2020, 06/26/2020   Moderna Sars-Covid-2 Vaccination 04/08/2019, 05/05/2019   Pneumococcal Conjugate-13 12/19/2013   Pneumococcal Polysaccharide-23 09/24/2007, 05/22/2017   Td 12/25/2008   Tdap 03/22/2018   Zoster Recombinat (Shingrix) 07/16/2019, 10/28/2019   Zoster, Live 09/24/2007   Pertinent  Health Maintenance Due  Topic Date Due   FOOT EXAM  Never done   URINE MICROALBUMIN  Never done   OPHTHALMOLOGY EXAM  04/07/2021 (Originally 10/04/1950)   HEMOGLOBIN A1C  06/17/2021   INFLUENZA VACCINE  Completed   COLONOSCOPY (Pts 45-70yr Insurance coverage will need to be confirmed)  Discontinued   Fall Risk 01/26/2021 01/26/2021 01/26/2021 01/27/2021 02/24/2021  Falls in the past year? - - - - 0  Was there an injury with Fall? - - - - 0  Fall Risk Category Calculator - - - - 0  Fall Risk Category - - - - Low  Patient Fall Risk Level _0   Patient at Risk for Falls Due to - - - - Impaired mobility  Patient at Risk for Falls Due to - - - - -  Fall risk Follow up - - - - Falls evaluation completed   Functional Status Survey:    Vitals:   03/22/21 1635  BP: 135/79  Pulse: 81  Resp: 16  Temp: 97.7 F (36.5 C)  SpO2: 96%  Weight: 143 lb 12.8 oz (65.2 kg)  Height: _1  (1.702 m)   Body mass index is 22.52 kg/m. Physical Exam Vitals reviewed.  Constitutional:      Appearance: Normal appearance.     Comments: Sleepy Opens his eyes for few min and then go to sleep  HENT:     Head: Normocephalic.     Mouth/Throat:     Mouth: Mucous  membranes are moist.     Pharynx: Oropharynx is clear.  Eyes:     Pupils: Pupils are equal, round, and reactive to light.  Cardiovascular:     Rate and Rhythm: Normal rate and regular rhythm.     Pulses: Normal pulses.     Heart sounds: No murmur heard. Pulmonary:     Effort: Pulmonary effort is normal. No respiratory distress.     Breath sounds: Normal breath sounds. No rales.  Abdominal:     General: Abdomen is flat. Bowel sounds are normal.     Palpations: Abdomen is soft.  Musculoskeletal:        General: No swelling.     Cervical back: Neck supple.  Skin:    General: Skin is warm.  Neurological:     Comments: Has Left UE contracture Left Leg moves spontaneously Has good strength in RUE and RLE   Psychiatric:        Mood and Affect: Mood normal.        Thought Content: Thought content normal.    Labs reviewed: Recent Labs    12/18/20 1630 12/19/20 0610 12/19/20 1632 12/20/20 0153 01/25/21 0623 01/25/21 0853 01/26/21 0435 01/27/21 0538 02/01/21 0000 02/08/21 0000 02/09/21 0000 03/01/21 0000 03/04/21 0000 03/09/21 0000  NA  --  139  --    < > 143  --  143 140   < > 150*   < > 142 139 141  K  --  3.5  --    < > 3.6  --  3.8 3.5   < > 3.7   < > 4.0 4.1 4.0  CL  --  108  --    < > 107  --  104 107   < > 107   < > 108 102 102  CO2  --  23  --    < > 27  --  26 25   < > 22   < > 22 22 23*  GLUCOSE  --  186*  --    < > 93  --  105* 137*  --   --   --   --   --   --   BUN  --  16  --    < > 11  --  12 15   < > 23*   < > _0 CREATININE  --  0.76  --    < > 0.94  --  0.95 1.06   < > 0.9   < > 0.6 0.6 0.6  CALCIUM  --  7.7*  --    < > 8.2*  --  8.7* 8.7*   < > 9.9   < > 9.1 9.4 9.1  MG 1.9 2.0 1.9   < >  --    < > 2.2 2.4  --  2.1  --   --   --   --   PHOS 3.1 2.7 2.4*  --   --   --   --   --   --   --   --   --   --   --    < > = values in this interval not displayed.   Recent Labs    01/03/21 0221 01/08/21 0000 01/20/21 1631 01/23/21 1355 02/08/21 0000  02/22/21 0000 02/23/21 0000 03/09/21 0000  AST 32   < > 27 37   < > 33 32 23  ALT 63*   < > 35 34   < > 36 35 23  ALKPHOS 94   < > 115 107   < > 226* 244* 230*  BILITOT 0.4  --  0.6 0.7  --   --   --   --   PROT 6.3*  --  6.9 6.9  --   --   --   --   ALBUMIN 1.9*   < > 2.5* 2.4*   < > 3.3* 3.5 3.6   < > = values in this interval not displayed.   Recent Labs    08/16/20 0427 08/17/20 0340 12/14/20 1245 12/14/20 1504 12/16/20 0130 12/18/20 0221 01/24/21 0154 01/25/21 0623 01/26/21 0435 02/01/21 0000 02/05/21 1111 02/08/21 0000 02/22/21 0000 03/04/21 0000 03/09/21 0000  WBC 7.9   < > 12.0*   < > 10.7*   < > 10.0  --  10.4   < > 9.8   < > 9.6 10.1 9.7  NEUTROABS 5.7  --  9.3*  --  7.7  --   --   --   --   --   --   --   --   --   --   HGB 9.0*   < > 11.7*   < > 9.9*   < > 7.3*   < > 7.9*   < > 9.8*   < > 9.5* 10.6* 10.2*  HCT 28.2*   < > 36.8*   < > 30.7*   < > 23.3*   < > 25.9*   < > 31.8*   < > 31* 34* 32*  MCV 98.6   < > 90.0   < >  88.0   < > 88.3  --  88.7  --  85.0  --   --   --   --   PLT 177   < > 119*   < > 106*   < > 395  --  417*   < > 462*   < > 272 313 285   < > = values in this interval not displayed.   Lab Results  Component Value Date   TSH 1.773 01/24/2021   Lab Results  Component Value Date   HGBA1C 7.3 (H) 12/17/2020   Lab Results  Component Value Date   CHOL 119 08/08/2020   HDL 42 08/08/2020   LDLCALC 56 08/08/2020   TRIG 105 08/08/2020   CHOLHDL 2.8 08/08/2020    Significant Diagnostic Results in last 30 days:  No results found.  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 Anemia HGB staying stable HTN On Norvasc Elevated Alk phos Will need follow up Family/ staff Communication:   Labs/tests ordered:

## 2021-03-23 DIAGNOSIS — G9341 Metabolic encephalopathy: Secondary | ICD-10-CM | POA: Diagnosis not present

## 2021-03-23 DIAGNOSIS — I69812 Visuospatial deficit and spatial neglect following other cerebrovascular disease: Secondary | ICD-10-CM | POA: Diagnosis not present

## 2021-03-23 DIAGNOSIS — I5022 Chronic systolic (congestive) heart failure: Secondary | ICD-10-CM | POA: Diagnosis not present

## 2021-03-23 DIAGNOSIS — R41841 Cognitive communication deficit: Secondary | ICD-10-CM | POA: Diagnosis not present

## 2021-03-23 DIAGNOSIS — H538 Other visual disturbances: Secondary | ICD-10-CM | POA: Diagnosis not present

## 2021-03-23 DIAGNOSIS — I6601 Occlusion and stenosis of right middle cerebral artery: Secondary | ICD-10-CM | POA: Diagnosis not present

## 2021-03-23 DIAGNOSIS — I5032 Chronic diastolic (congestive) heart failure: Secondary | ICD-10-CM | POA: Diagnosis not present

## 2021-03-23 DIAGNOSIS — I69391 Dysphagia following cerebral infarction: Secondary | ICD-10-CM | POA: Diagnosis not present

## 2021-03-23 DIAGNOSIS — R41840 Attention and concentration deficit: Secondary | ICD-10-CM | POA: Diagnosis not present

## 2021-03-23 DIAGNOSIS — M6389 Disorders of muscle in diseases classified elsewhere, multiple sites: Secondary | ICD-10-CM | POA: Diagnosis not present

## 2021-03-23 DIAGNOSIS — S065X3S Traumatic subdural hemorrhage with loss of consciousness of 1 hour to 5 hours 59 minutes, sequela: Secondary | ICD-10-CM | POA: Diagnosis not present

## 2021-03-23 DIAGNOSIS — I69354 Hemiplegia and hemiparesis following cerebral infarction affecting left non-dominant side: Secondary | ICD-10-CM | POA: Diagnosis not present

## 2021-03-23 DIAGNOSIS — R293 Abnormal posture: Secondary | ICD-10-CM | POA: Diagnosis not present

## 2021-03-23 DIAGNOSIS — R278 Other lack of coordination: Secondary | ICD-10-CM | POA: Diagnosis not present

## 2021-03-24 DIAGNOSIS — G9341 Metabolic encephalopathy: Secondary | ICD-10-CM | POA: Diagnosis not present

## 2021-03-24 DIAGNOSIS — I69812 Visuospatial deficit and spatial neglect following other cerebrovascular disease: Secondary | ICD-10-CM | POA: Diagnosis not present

## 2021-03-24 DIAGNOSIS — I69354 Hemiplegia and hemiparesis following cerebral infarction affecting left non-dominant side: Secondary | ICD-10-CM | POA: Diagnosis not present

## 2021-03-24 DIAGNOSIS — R293 Abnormal posture: Secondary | ICD-10-CM | POA: Diagnosis not present

## 2021-03-24 DIAGNOSIS — H538 Other visual disturbances: Secondary | ICD-10-CM | POA: Diagnosis not present

## 2021-03-24 DIAGNOSIS — I6601 Occlusion and stenosis of right middle cerebral artery: Secondary | ICD-10-CM | POA: Diagnosis not present

## 2021-03-24 DIAGNOSIS — I5022 Chronic systolic (congestive) heart failure: Secondary | ICD-10-CM | POA: Diagnosis not present

## 2021-03-24 DIAGNOSIS — R41841 Cognitive communication deficit: Secondary | ICD-10-CM | POA: Diagnosis not present

## 2021-03-24 DIAGNOSIS — R278 Other lack of coordination: Secondary | ICD-10-CM | POA: Diagnosis not present

## 2021-03-24 DIAGNOSIS — S065X3S Traumatic subdural hemorrhage with loss of consciousness of 1 hour to 5 hours 59 minutes, sequela: Secondary | ICD-10-CM | POA: Diagnosis not present

## 2021-03-24 DIAGNOSIS — M6389 Disorders of muscle in diseases classified elsewhere, multiple sites: Secondary | ICD-10-CM | POA: Diagnosis not present

## 2021-03-24 DIAGNOSIS — R41840 Attention and concentration deficit: Secondary | ICD-10-CM | POA: Diagnosis not present

## 2021-03-24 DIAGNOSIS — I69391 Dysphagia following cerebral infarction: Secondary | ICD-10-CM | POA: Diagnosis not present

## 2021-03-24 DIAGNOSIS — I5032 Chronic diastolic (congestive) heart failure: Secondary | ICD-10-CM | POA: Diagnosis not present

## 2021-03-25 DIAGNOSIS — R278 Other lack of coordination: Secondary | ICD-10-CM | POA: Diagnosis not present

## 2021-03-25 DIAGNOSIS — I69812 Visuospatial deficit and spatial neglect following other cerebrovascular disease: Secondary | ICD-10-CM | POA: Diagnosis not present

## 2021-03-25 DIAGNOSIS — R41840 Attention and concentration deficit: Secondary | ICD-10-CM | POA: Diagnosis not present

## 2021-03-25 DIAGNOSIS — I69391 Dysphagia following cerebral infarction: Secondary | ICD-10-CM | POA: Diagnosis not present

## 2021-03-25 DIAGNOSIS — H538 Other visual disturbances: Secondary | ICD-10-CM | POA: Diagnosis not present

## 2021-03-25 DIAGNOSIS — I69354 Hemiplegia and hemiparesis following cerebral infarction affecting left non-dominant side: Secondary | ICD-10-CM | POA: Diagnosis not present

## 2021-03-25 DIAGNOSIS — R293 Abnormal posture: Secondary | ICD-10-CM | POA: Diagnosis not present

## 2021-03-25 DIAGNOSIS — S065X3S Traumatic subdural hemorrhage with loss of consciousness of 1 hour to 5 hours 59 minutes, sequela: Secondary | ICD-10-CM | POA: Diagnosis not present

## 2021-03-25 DIAGNOSIS — M6389 Disorders of muscle in diseases classified elsewhere, multiple sites: Secondary | ICD-10-CM | POA: Diagnosis not present

## 2021-03-25 DIAGNOSIS — R41841 Cognitive communication deficit: Secondary | ICD-10-CM | POA: Diagnosis not present

## 2021-03-25 DIAGNOSIS — I6601 Occlusion and stenosis of right middle cerebral artery: Secondary | ICD-10-CM | POA: Diagnosis not present

## 2021-03-25 DIAGNOSIS — G9341 Metabolic encephalopathy: Secondary | ICD-10-CM | POA: Diagnosis not present

## 2021-03-25 DIAGNOSIS — I5032 Chronic diastolic (congestive) heart failure: Secondary | ICD-10-CM | POA: Diagnosis not present

## 2021-03-25 DIAGNOSIS — I5022 Chronic systolic (congestive) heart failure: Secondary | ICD-10-CM | POA: Diagnosis not present

## 2021-03-26 DIAGNOSIS — I5032 Chronic diastolic (congestive) heart failure: Secondary | ICD-10-CM | POA: Diagnosis not present

## 2021-03-26 DIAGNOSIS — R41840 Attention and concentration deficit: Secondary | ICD-10-CM | POA: Diagnosis not present

## 2021-03-26 DIAGNOSIS — I69812 Visuospatial deficit and spatial neglect following other cerebrovascular disease: Secondary | ICD-10-CM | POA: Diagnosis not present

## 2021-03-26 DIAGNOSIS — R278 Other lack of coordination: Secondary | ICD-10-CM | POA: Diagnosis not present

## 2021-03-26 DIAGNOSIS — R293 Abnormal posture: Secondary | ICD-10-CM | POA: Diagnosis not present

## 2021-03-26 DIAGNOSIS — I69391 Dysphagia following cerebral infarction: Secondary | ICD-10-CM | POA: Diagnosis not present

## 2021-03-26 DIAGNOSIS — I69354 Hemiplegia and hemiparesis following cerebral infarction affecting left non-dominant side: Secondary | ICD-10-CM | POA: Diagnosis not present

## 2021-03-26 DIAGNOSIS — M6389 Disorders of muscle in diseases classified elsewhere, multiple sites: Secondary | ICD-10-CM | POA: Diagnosis not present

## 2021-03-26 DIAGNOSIS — I6601 Occlusion and stenosis of right middle cerebral artery: Secondary | ICD-10-CM | POA: Diagnosis not present

## 2021-03-26 DIAGNOSIS — I5022 Chronic systolic (congestive) heart failure: Secondary | ICD-10-CM | POA: Diagnosis not present

## 2021-03-26 DIAGNOSIS — H538 Other visual disturbances: Secondary | ICD-10-CM | POA: Diagnosis not present

## 2021-03-26 DIAGNOSIS — R41841 Cognitive communication deficit: Secondary | ICD-10-CM | POA: Diagnosis not present

## 2021-03-26 DIAGNOSIS — G9341 Metabolic encephalopathy: Secondary | ICD-10-CM | POA: Diagnosis not present

## 2021-03-26 DIAGNOSIS — S065X3S Traumatic subdural hemorrhage with loss of consciousness of 1 hour to 5 hours 59 minutes, sequela: Secondary | ICD-10-CM | POA: Diagnosis not present

## 2021-03-29 DIAGNOSIS — I69812 Visuospatial deficit and spatial neglect following other cerebrovascular disease: Secondary | ICD-10-CM | POA: Diagnosis not present

## 2021-03-29 DIAGNOSIS — S065X3S Traumatic subdural hemorrhage with loss of consciousness of 1 hour to 5 hours 59 minutes, sequela: Secondary | ICD-10-CM | POA: Diagnosis not present

## 2021-03-29 DIAGNOSIS — R41840 Attention and concentration deficit: Secondary | ICD-10-CM | POA: Diagnosis not present

## 2021-03-29 DIAGNOSIS — G9341 Metabolic encephalopathy: Secondary | ICD-10-CM | POA: Diagnosis not present

## 2021-03-29 DIAGNOSIS — R293 Abnormal posture: Secondary | ICD-10-CM | POA: Diagnosis not present

## 2021-03-29 DIAGNOSIS — I69391 Dysphagia following cerebral infarction: Secondary | ICD-10-CM | POA: Diagnosis not present

## 2021-03-29 DIAGNOSIS — R634 Abnormal weight loss: Secondary | ICD-10-CM | POA: Diagnosis not present

## 2021-03-29 DIAGNOSIS — I5032 Chronic diastolic (congestive) heart failure: Secondary | ICD-10-CM | POA: Diagnosis not present

## 2021-03-29 DIAGNOSIS — H538 Other visual disturbances: Secondary | ICD-10-CM | POA: Diagnosis not present

## 2021-03-29 DIAGNOSIS — I38 Endocarditis, valve unspecified: Secondary | ICD-10-CM | POA: Diagnosis not present

## 2021-03-29 DIAGNOSIS — M6389 Disorders of muscle in diseases classified elsewhere, multiple sites: Secondary | ICD-10-CM | POA: Diagnosis not present

## 2021-03-29 DIAGNOSIS — I69354 Hemiplegia and hemiparesis following cerebral infarction affecting left non-dominant side: Secondary | ICD-10-CM | POA: Diagnosis not present

## 2021-03-29 DIAGNOSIS — R278 Other lack of coordination: Secondary | ICD-10-CM | POA: Diagnosis not present

## 2021-03-29 DIAGNOSIS — I6601 Occlusion and stenosis of right middle cerebral artery: Secondary | ICD-10-CM | POA: Diagnosis not present

## 2021-03-29 DIAGNOSIS — I5022 Chronic systolic (congestive) heart failure: Secondary | ICD-10-CM | POA: Diagnosis not present

## 2021-03-29 DIAGNOSIS — R41841 Cognitive communication deficit: Secondary | ICD-10-CM | POA: Diagnosis not present

## 2021-03-29 LAB — BASIC METABOLIC PANEL
BUN: 14 (ref 4–21)
CO2: 24 — AB (ref 13–22)
Chloride: 103 (ref 99–108)
Creatinine: 0.6 (ref 0.6–1.3)
Glucose: 93
Potassium: 3.7 (ref 3.4–5.3)
Sodium: 139 (ref 137–147)

## 2021-03-29 LAB — COMPREHENSIVE METABOLIC PANEL: Calcium: 9.3 (ref 8.7–10.7)

## 2021-03-30 DIAGNOSIS — M6389 Disorders of muscle in diseases classified elsewhere, multiple sites: Secondary | ICD-10-CM | POA: Diagnosis not present

## 2021-03-30 DIAGNOSIS — G9341 Metabolic encephalopathy: Secondary | ICD-10-CM | POA: Diagnosis not present

## 2021-03-30 DIAGNOSIS — I69391 Dysphagia following cerebral infarction: Secondary | ICD-10-CM | POA: Diagnosis not present

## 2021-03-30 DIAGNOSIS — H538 Other visual disturbances: Secondary | ICD-10-CM | POA: Diagnosis not present

## 2021-03-30 DIAGNOSIS — I69354 Hemiplegia and hemiparesis following cerebral infarction affecting left non-dominant side: Secondary | ICD-10-CM | POA: Diagnosis not present

## 2021-03-30 DIAGNOSIS — R278 Other lack of coordination: Secondary | ICD-10-CM | POA: Diagnosis not present

## 2021-03-30 DIAGNOSIS — I69812 Visuospatial deficit and spatial neglect following other cerebrovascular disease: Secondary | ICD-10-CM | POA: Diagnosis not present

## 2021-03-30 DIAGNOSIS — I5032 Chronic diastolic (congestive) heart failure: Secondary | ICD-10-CM | POA: Diagnosis not present

## 2021-03-30 DIAGNOSIS — R41841 Cognitive communication deficit: Secondary | ICD-10-CM | POA: Diagnosis not present

## 2021-03-30 DIAGNOSIS — R41840 Attention and concentration deficit: Secondary | ICD-10-CM | POA: Diagnosis not present

## 2021-03-30 DIAGNOSIS — I5022 Chronic systolic (congestive) heart failure: Secondary | ICD-10-CM | POA: Diagnosis not present

## 2021-03-30 DIAGNOSIS — R293 Abnormal posture: Secondary | ICD-10-CM | POA: Diagnosis not present

## 2021-03-30 DIAGNOSIS — S065X3S Traumatic subdural hemorrhage with loss of consciousness of 1 hour to 5 hours 59 minutes, sequela: Secondary | ICD-10-CM | POA: Diagnosis not present

## 2021-03-30 DIAGNOSIS — I6601 Occlusion and stenosis of right middle cerebral artery: Secondary | ICD-10-CM | POA: Diagnosis not present

## 2021-03-31 ENCOUNTER — Encounter (HOSPITAL_COMMUNITY): Payer: Self-pay | Admitting: Cardiology

## 2021-03-31 ENCOUNTER — Other Ambulatory Visit (HOSPITAL_COMMUNITY): Payer: Self-pay | Admitting: *Deleted

## 2021-03-31 ENCOUNTER — Ambulatory Visit (HOSPITAL_BASED_OUTPATIENT_CLINIC_OR_DEPARTMENT_OTHER)
Admission: RE | Admit: 2021-03-31 | Discharge: 2021-03-31 | Disposition: A | Discharge status: Home / Self Care | Payer: Medicare Other | Source: Ambulatory Visit | Attending: Internal Medicine | Admitting: Internal Medicine

## 2021-03-31 ENCOUNTER — Ambulatory Visit (HOSPITAL_COMMUNITY)
Admission: RE | Admit: 2021-03-31 | Discharge: 2021-03-31 | Disposition: A | Discharge status: Home / Self Care | Payer: Medicare Other | Source: Ambulatory Visit | Attending: Cardiology | Admitting: Cardiology

## 2021-03-31 ENCOUNTER — Other Ambulatory Visit: Payer: Self-pay

## 2021-03-31 VITALS — BP 102/68 | HR 82

## 2021-03-31 DIAGNOSIS — I5033 Acute on chronic diastolic (congestive) heart failure: Secondary | ICD-10-CM

## 2021-03-31 DIAGNOSIS — I5032 Chronic diastolic (congestive) heart failure: Secondary | ICD-10-CM | POA: Diagnosis not present

## 2021-03-31 DIAGNOSIS — R251 Tremor, unspecified: Secondary | ICD-10-CM | POA: Diagnosis not present

## 2021-03-31 DIAGNOSIS — T826XXD Infection and inflammatory reaction due to cardiac valve prosthesis, subsequent encounter: Secondary | ICD-10-CM | POA: Insufficient documentation

## 2021-03-31 DIAGNOSIS — I38 Endocarditis, valve unspecified: Secondary | ICD-10-CM | POA: Diagnosis not present

## 2021-03-31 DIAGNOSIS — X58XXXD Exposure to other specified factors, subsequent encounter: Secondary | ICD-10-CM | POA: Diagnosis not present

## 2021-03-31 DIAGNOSIS — E119 Type 2 diabetes mellitus without complications: Secondary | ICD-10-CM | POA: Insufficient documentation

## 2021-03-31 DIAGNOSIS — Z953 Presence of xenogenic heart valve: Secondary | ICD-10-CM | POA: Diagnosis not present

## 2021-03-31 LAB — ECHOCARDIOGRAM COMPLETE
AR max vel: 2.69 cm2
AV Area VTI: 2.92 cm2
AV Area mean vel: 2.56 cm2
AV Mean grad: 8.5 mmHg
AV Peak grad: 17.3 mmHg
Ao pk vel: 2.08 m/s
Area-P 1/2: 1.58 cm2
Calc EF: 43 %
MV VTI: 1.74 cm2
S' Lateral: 3.2 cm
Single Plane A2C EF: 50 %
Single Plane A4C EF: 32.6 %

## 2021-03-31 MED ORDER — AMLODIPINE BESYLATE 10 MG PO TABS: 5.0000 mg | ORAL_TABLET | Freq: Every day | ORAL | Status: DC

## 2021-03-31 NOTE — Patient Instructions (Signed)
Medication Changes:  Decrease Amlodipine 5mg  daily  Lab Work:  Labs done today, your results will be available in MyChart, we will contact you for abnormal readings.   Testing/Procedures:  none  Referrals:  none  Special Instructions // Education:  none  Follow-Up in: 3 months  At the [facility], you and your health needs are our priority. We have a designated team specialized in the treatment of Heart Failure. This Care Team includes your primary Heart Failure Specialized Cardiologist (physician), Advanced Practice Providers (APPs- Physician Assistants and Nurse Practitioners), and Pharmacist who all work together to provide you with the care you need, when you need it.   You may see any of the following providers on your designated Care Team at your next follow up:  Dr Glori Bickers Dr Haynes Kerns, NP Lyda Jester, [place] [facility] [place], [place] Audry Riles, PharmD   Please be sure to bring in all your medications bottles to every appointment.   Need to Contact us:  If you have any questions or concerns before your next appointment please send [place] a message through [place] or call our office at [phone].    TO LEAVE A MESSAGE FOR THE NURSE SELECT OPTION 2, PLEASE LEAVE A MESSAGE INCLUDING: YOUR NAME DATE OF BIRTH CALL BACK NUMBER REASON FOR CALL**this is important as we prioritize the call backs  YOU WILL RECEIVE A CALL BACK THE SAME DAY AS LONG AS YOU CALL BEFORE 4:00 PM

## 2021-03-31 NOTE — Progress Notes (Signed)
°  Echocardiogram 2D Echocardiogram has been performed.  [name] [name] 03/31/2021, 10:56 AM

## 2021-04-01 DIAGNOSIS — R41840 Attention and concentration deficit: Secondary | ICD-10-CM | POA: Diagnosis not present

## 2021-04-01 DIAGNOSIS — S065X3S Traumatic subdural hemorrhage with loss of consciousness of 1 hour to 5 hours 59 minutes, sequela: Secondary | ICD-10-CM | POA: Diagnosis not present

## 2021-04-01 DIAGNOSIS — I69812 Visuospatial deficit and spatial neglect following other cerebrovascular disease: Secondary | ICD-10-CM | POA: Diagnosis not present

## 2021-04-01 DIAGNOSIS — H538 Other visual disturbances: Secondary | ICD-10-CM | POA: Diagnosis not present

## 2021-04-01 DIAGNOSIS — G9341 Metabolic encephalopathy: Secondary | ICD-10-CM | POA: Diagnosis not present

## 2021-04-01 DIAGNOSIS — I5022 Chronic systolic (congestive) heart failure: Secondary | ICD-10-CM | POA: Diagnosis not present

## 2021-04-01 DIAGNOSIS — I6601 Occlusion and stenosis of right middle cerebral artery: Secondary | ICD-10-CM | POA: Diagnosis not present

## 2021-04-01 DIAGNOSIS — R278 Other lack of coordination: Secondary | ICD-10-CM | POA: Diagnosis not present

## 2021-04-01 DIAGNOSIS — R293 Abnormal posture: Secondary | ICD-10-CM | POA: Diagnosis not present

## 2021-04-01 DIAGNOSIS — M6389 Disorders of muscle in diseases classified elsewhere, multiple sites: Secondary | ICD-10-CM | POA: Diagnosis not present

## 2021-04-01 NOTE — Addendum Note (Signed)
Encounter addended by: Larey Dresser, MD on: 04/01/2021 9:49 PM  Actions taken: Clinical Note Signed, Level of Service modified, Visit diagnoses modified

## 2021-04-01 NOTE — Progress Notes (Signed)
Date:  04/01/2021   ID:  [name] [name], DOB 01-13-1941, MRN [id number]    Provider location: [facility] Type of Visit: Established patient  PCP:  [name] Dad, MD  Cardiologist:  Dr. Aundra [name]   History of Present Illness: [name] [name] is a 81 y.o. male who has a history of rheumatic fever and AI/[name] s/p bioprosthetic AVR and MVR at the [facility] as well as MAZE procedure all in 2006.  He also has an ascending aortic aneurysm.   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  I started him on dronedarone and coumadin with plan for cardioversion after 4 wks of therapeutic anticoagulation if he did not convert back to NSR on his own.  He went back into NSR spontaneously and later stopped coumadin. Atrial flutter recurred in 3/15 and again resolved spontaneously.   In 6/15, patient was admitted with transient dysarthria (resolved in about 10 minutes).  However, he was found to have evidence for CVA by MRI.  Therefore, he was started on coumadin. He was noted to be in atypical atrial flutter again.  Ranolazine was added to dronedarone.     In 8/15, [name] [name] was seen in consultation at the [name] clinic.  He had atrial flutter ablation at [facility] in 11/15.     He was seen in followup at [facility] in 2/16.  Ranolazine was stopped.  Echo in 2/16 showed EF 55% with stable bioprosthetic mitral and aortic valves.  CTA chest showed stable 5 cm ascending aortic aneurysm.  He went back into atypical atrial flutter in 4/16.  Ranolazine was restarted and he was cardioverted back to NSR.      Cardiolite in 12/18 showed no ischemia. He wore a 30 day monitor in 12/18 with no arrhythmia.     He went into atrial flutter again during a respiratory illness in 2/19.  On 04/24/17, he was cardioverted back to NSR.  He thinks that he went into atrial flutter again later on that day. He was back in atrial flutter at followup  appointment. He was then admitted for Tikosyn loading in 4/19 and was cardioverted back to NSR after the load.    He had atrial fibrillation again in 6/19 with DCCV.    He was seen at the [facility] in 9/19.  Echo showed normal LV and RV systolic function, bioprosthetic MV with mean gradient up to 9 mmHg, normal bioprosthetic AoV.  CTA chest showed aortic root up to 5.3 cm.    He was back in atrial flutter in 12/19, had DCCV back to NSR.  Echo in 12/19 showed EF 60-65%, normal bioprosthetic aortic valve, bioprosthetic mitral valve with mean gradient 4 mmHg and no [name], severe LAE, aortic root 4.8 cm.   He went to the [facility] in 3/20.  Echo at that time showed EF 60-65% with stable bioprosthetic mitral and aortic valves (mean gradient 7 mmHg across MV and 9 mmHg across AoV).  CTA showed 5.0 cm aortic root and ascending aorta, stable.  He returned to the [facility] in 7/20.  He had atrial fibrillation/flutter ablation again and thinks he has been in NSR since that time.  CTA chest showed 5.1 cm ascending thoracic aorta. Seen at [facility] in 7/21.  Echo showed stable bioprosthetic mitral and aortic valves with normal EF.  CTA chest showed 5.0 cm ascending aortic aneurysm.   Last visit  at [facility] 1/22 he was doing well, exercising and euvolemic in NSR.  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 He subsequently developed right MCA territory stroke (as Eliquis was held) in June 2022, requiring thrombectomy and was discharged to SNF with significant disability.    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 Initial echo showed EF 65-70%, no obvious vegetation. Cards consulted and TEE showed bioprosthetic MV vegetation with trivial [name] and mean gradient 8.  He is not a candidate for mitral valve  replacement.  ID consulted he was started on Rocephin/ampicillin via PICC with a stop date of 01/28/21 then continue on amoxicillin 500 mg bid indefinitely. Hospital course complicated by prolonged encephalopathy.  Neurology consulted and MRI brian showed new left cerebellar vermis CVA, likely septic emboli in the setting of mitral valve endocarditis.  He was discharged back to SNF, weight 187 lbs.  He was readmitted in 5/22 with volume overload and was diuresed. He had transient atrial fibrillation but went back into NSR.   Echo was done today and reviewed.  EF 50%, mild-moderate LVH, mildly decreased RV systolic function, bioprosthetic mitral valve appears to have vegetation present with mild [name], mean gradient 8 mmHg (mild-moderate bioprosthetic valve stenosis), bioprosthetic aortic valve with mean gradient 9 mmHg and no AI, the bioprosthetic aortic valve appears thickened, 5.1 cm ascending aorta.   He returns today for followup of diastolic CHF and mitral valve endocarditis.  No fevers/chills.  He is sitting up in wheelchair and alert but does not respond to questions.  Per his wife, he sleeps most of the time.  He has not been taking torsemide.  He does OT, not really able to participate with PT.   ECG (12/12/11): Atypical atrial flutter versus atrial tachycardia with rate around 100 bpm, QTc normal.  ECG (12/29/11): NSR, 1st degree AV block PR 242 msec.    ECG (12/13): NSR, 1st degree AV block PR 244 msec    ECG (10/14): NSR, 1st degree AV block ECG (3/15): Atypical atrial flutter with rate 87, inferior T wave inversions ECG (6/15): Atypical atrial flutter with rate 77 ECG (8/15): atypical atrial flutter rate 82 ECG (10/15): NSR, 1st degree AV block ECG (12/15): NSR, 1st degree AV block, QTc 410 msec ECG (5/16): NSR, 1st degree AV block, QTc 419 msec ECG (5/17): NSR, 1st degree AV block 270 msec, QTc 433 ECG (2/18): NSR, 1st degree AVB, nonspecific ST-T changes, QTc 435 msec ECG (11/18): NSR,  1st degree AVB, QTc 424 msec ECG (2/19, personally reviewed): atypical atrial flutter at 87, QTc 442 msec ECG (4/19, personally reviewed): NSR, 1st degree AVB, QTc 453 msec ECG (10/19, personally reviewed): NSR, 1st degree AVB, QTc 448 msec ECG (1/20, personally reviewed): atypical atrial flutter in 90s, QTc 472 msec.  ECG (8/20): NSR, 1st degree AVB, QTc 448 msec  ECG (1/21, personally reviewed): NSR, 1st degree AVB, QTc 434 msec ECG (5/21, personally reviewed): NSR, 1st degree AVB, QTc 447 ECG (1/22, personally reviewed): NSR, 1st degree AVB (306 msec), QTc 446 msec ECG (11/22, personally reviewed): NSR, long 1st degree AVB, QTc 489 ECG (1/23, personally reviewed): NSR, long 1st degree AV 302 msec, nonspecific ST changes, QTc 457 msec   Labs (3/14): LDL 64, TSH normal, K 3.9, creatinine 1.0     Labs (6/15): LDL 54, HDL 48, creatinine 0.95   Labs (9/15): LDL 54, HDL 25  Labs (10/15): K 4.5, creatinine 1.3, AST 45, ALT 64 Labs (4/16): K 4.3, creatinine 1.28, HCT 40.7 Labs (5/17): K 4.1, creatinine 1.16, LDL 66, HDL 47 Labs (2/18): LDL 42, HDL 43, K 4.4, creatinine 1.48 Labs (3/18): hgb 13.8 Labs (2/19): LDL 59 Labs (4/19): K 3.9, creatinine 0.97, Mg 2.0 Labs (6/19): K 4.4, creatinine 0.9 Labs (10/19): LDL 84 Labs (12/19): K 4.2, creatinine 1.13, hgb 13.9 Labs (3/20): K 4.1, creatinine 0.98 Labs (6/20): K 4.1, creatinine 0.91, LDL 103  Labs (9/20): K 4, creatinine 0.95 Labs (1/21): K 4, creatinine 0.94, LDL 80 Labs (5/21): K 4.3, creatinine 0.92 Labs (11/22): K 3.2, creatinine 0.6 => 0.88, WBCs 13, ESR 70 Labs (1/23): hgb 10.2, K 4, creatinine 0.6, AST/ALT normal                                                                                                                              PMH: 1. Atrial fibrillation: s/p MAZE and LAA ligation in 2006.  No documented atrial fibrillation since operation.  2. Atypical atrial flutter: 10/13, 3/15, and again in 6/15.  Recurrent 4/16 with  DCCV.  - Event monitor (12/18): no significant arrhythmia.  - Recurrence in 2/19: DCCV to NSR but quickly recurred.  - Admitted in 4/19 for Tikosyn loading and cardioverted to NSR.  - DCCV to NSR in 6/19.  - DCCV to NSR in 12/19. - 7/20 redo atrial flutter/fibrillation ablation.   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   - Echo 5/11 with EF 60-65%, normal bioprosthetic aortic valve, normal bioprosthetic mitral valve, moderate LAE.   - Echo 11/12 at [facility] looked ok per report.  - Echo (10/13) with EF 55-60%, mild LVH, normal bioprosthetic aortic and mitral valves, moderate LAE, PA systolic pressure 35 mmHg.   - Echo (6/15) with EF 60%, moderate LVH, normal-appearing bioprosthetic MV and AoV, PA systolic pressure 32 mmHg.   - Echo 9/15 [facility]) with EF 55%, mild LVH, PASP 52 mmHg, severe LAE, bioprosthetic mitral valve with no [name] and mean gradient 11 mmHg, moderate TR, bioprosthetic aortic valve with no AI and mean gradient 9 mmHg, dilated ascending aorta to 5 cm.   - Echo (2/16, [facility]) with EF 55%, stable bioprosthetic aortic and mitral valves (AoV mean gradient 7 mmHg, MV mean gradient 4 mmHg), normal RV size and systolic function.  - Echo (12/16, [facility]) with EF 58%, mild RV dilation with low normal systolic function, PA systolic pressure 34 mmHg, bioprosthetic MV and AoV functioning normally.  - Echo (5/18, [facility]) with EF 60-65%, RV normal size and systolic function, moderate LAE, bioprosthetic MV mean gradient 6 with no [name], bioprosthetic aortic valve with mean gradient 11 and no AI, PASP 41 mmHg.  - Echo (2/19): EF 60-65%, moderate LVH, bioprosthetic aortic and mitral valves functioning normally, PASP 23 mmHg, aortic root 4.5 cm.  -  Echo (9/19): EF 56%, normal RV size and systolic function, bioprosthetic MV with mean gradient 9 mmHg, trivial [name], bioprosthetic aortic valve  with mean gradient 12 mmHg, PASP 51 mmHg.  - Echo (12/19): EF 60-65%, normal bioprosthetic aortic valve, bioprosthetic mitral valve with mean gradient 4 mmHg and no [name], severe LAE, aortic root 4.8 cm.  - Echo (3/20, [facility]): EF 60-65%, normal RV, severe LAE, 5.0 cm ascending aorta, bioprosthetic MV with mean gradient 7 mmHg, bioprosthetic aortic valve with mean gradient 9 mmHg.  - Echo (7/20, [facility]): EF 55-60%, moderate septal hypertrophy, normal RV size and systolic function, bioprosthetic MV with trace [name] and mean gradient 4, bioprosthetic aortic valve with mean gradient 14 mmHg, severe LAE.  - Echo (7/21, [facility]): EF 60%, normal RV, s/p bioprosthetic mitral valve with trace [name] and mean gradient 5, s/p bioprosthetic aortic valve with trace AI and mean gradient 15 mmHg, ascending aorta 4.9 cm.  - Echo (10/22): EF 65-70%, normal RV, bioprosthetic AVR mean gradient 16 mmHg appears normal, bioprosthetic MV mean gradient 10, increased from prior but HR also in 100s when study done.   - TEE 10/22 bioprosthetic MV vegetation with trivial [name] and mean gradient 8.  - Echo (1/23): EF 50%, mild-moderate LVH, mildly decreased RV systolic function, bioprosthetic mitral valve appears to have vegetation present with mild [name], mean gradient 8 mmHg (mild-moderate bioprosthetic valve stenosis), bioprosthetic aortic valve with mean gradient 9 mmHg and no AI, the bioprosthetic aortic valve appears thickened, 5.1 cm ascending aorta.  4. Hyperlipidemia 5. L-spine surgery 6. LHC with no significant coronary disease in 2006 pre-op.  - Cardiolite (12/18): No ischemia.  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 - SDH 6/22 with craniotomy followed by MCA CVA due to occluded ICA while off Eliquis (revascularized).  - 10/22: Small left cerebellar infarct possibly embolic from MV endocarditis. 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  CTA chest  9/15 with dilated sinuses of valsalva to 5.0 cm and ascending aorta 4.9 cm. CTA chest (2/16) with 5.0 cm ascending aorta.  CTA chest (12/16) with 4.9 cm aortic root at SOV, 4.8 cm ascending aorta.  - CT chest (5/18, [facility]): 5.0 cm aortic root, 5.0 cm ascending aorta.  - CTA chest (10/19, [facility]): 5.3 cm aortic root, 5.2 cm ascending aorta.  - CTA chest (3/20, [facility]): 5.0 cm aortic root and ascending aorta - CTA chest (7/20, [facility]): 5.1 cm ascending aorta - CTA chest (7/21, [facility]): 5.1 cm aortic root, 5.0 cm ascending aorta.  9. 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 Subdural hematoma 5/22 with craniotomy 12. 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 Thrombectomy was done.   Current Outpatient Medications  Medication Sig Dispense Refill   amantadine (SYMMETREL) 100 MG capsule Take 100 mg by mouth 2 (two) times daily.     amoxicillin (AMOXIL) 400 MG/5ML suspension Take 6.3 mLs (500 mg total) by mouth 2 (two) times daily. Please flavor.  Duration to be determined by infectious disease MD during outpatient follow-up.     apixaban (ELIQUIS) 5 MG TABS tablet Take 1 tablet (5 mg total) by mouth 2 (two) times daily. 60 tablet 0   Artificial Saliva (BIOTENE MOISTURIZING MOUTH MT) Use as directed 2 sprays in the mouth or throat in the morning, at noon, and at bedtime.     atorvastatin (LIPITOR) 80 MG tablet Take 1 tablet (80 mg total) by  mouth daily.     bisacodyl (DULCOLAX) 10 MG suppository Place 1 suppository (10 mg total) rectally daily as needed for moderate constipation or mild constipation.     clobetasol cream (TEMOVATE) 4.31 % Apply 1 application topically daily as needed (for skin irritation).      dofetilide (TIKOSYN) 125 MCG capsule Take 3 capsules (375 mcg total) by mouth 2 (two) times daily.     famotidine (PEPCID) 20 MG tablet Take 20 mg by mouth at bedtime as needed for indigestion.      finasteride (PROSCAR) 5 MG tablet Take 1 tablet (5 mg total) by mouth daily. 30 tablet 0   hydrocortisone cream 1 % Apply topically 2 (two) times daily as needed for itching (rash). 30 g 0   lacosamide (VIMPAT) 50 MG TABS tablet Take 1 tablet (50 mg total) by mouth 2 (two) times daily. 60 tablet 3   levalbuterol (XOPENEX) 0.63 MG/3ML nebulizer solution Take 0.63 mg by nebulization every 6 (six) hours as needed for wheezing or shortness of breath.     levalbuterol (XOPENEX) 0.63 MG/3ML nebulizer solution Take 0.63 mg by nebulization as needed for wheezing or shortness of breath.     magnesium gluconate (MAGONATE) 500 MG tablet Take 1 tablet (500 mg total) by mouth daily. 30 tablet 0   melatonin 5 MG TABS Take 5 mg by mouth at bedtime.     methenamine (HIPREX) 1 g tablet Take 1 g by mouth 2 (two) times daily with a meal.     Mouthwashes (BIOTENE DRY MOUTH MT) Use as directed 2 sprays in the mouth or throat 3 (three) times daily.     Multiple Vitamin (MULTIVITAMIN WITH MINERALS) TABS tablet Take 1 tablet by mouth daily.     Polyethyl Glycol-Propyl Glycol (SYSTANE) 0.4-0.3 % SOLN Place 2 drops into both eyes daily.     polyethylene glycol (MIRALAX / GLYCOLAX) 17 g packet Take 17 g by mouth daily.     potassium chloride (KLOR-CON M) 10 MEQ tablet Take 20 mEq by mouth daily.     senna-docusate (SENOKOT-S) 8.6-50 MG tablet Take 1 tablet by mouth at bedtime as needed.     torsemide (DEMADEX) 20 MG tablet Take 20 mg by mouth daily as needed.     amLODipine (NORVASC) 10 MG tablet Take 0.5 tablets (5 mg total) by mouth daily.     No current facility-administered medications for this encounter.    Allergies:   Naproxen and No healthtouch food allergies   Social History:  The patient  reports that he has never smoked. He has never used smokeless tobacco. He reports that he does not drink alcohol and does not use drugs.   Family History:  The patient's family history includes Cancer in his father; Diabetes  in his brother and paternal aunt; Heart attack in his maternal grandfather; Macular degeneration in his mother.   ROS:  Please see the history of present illness.   All other systems are personally reviewed and negative.   Exam:   BP 102/68    Pulse 82    SpO2 98%  General: NAD Neck: No JVD, no thyromegaly or thyroid nodule.  Lungs: Clear to auscultation bilaterally with normal respiratory effort. CV: Nondisplaced PMI.  Heart regular S1/S2, no S3/S4, no murmur.  No peripheral edema.  No carotid bruit.  Normal pedal pulses.  Abdomen: Soft, nontender, no hepatosplenomegaly, no distention.  Skin: Intact without lesions or rashes.  Neurologic: Nonverbal today.  Left hemiparesis and left-sided neglect Extremities:  No clubbing or cyanosis.  HEENT: Normal.    Recent Labs: 01/23/2021: B Natriuretic Peptide 223.5 01/24/2021: TSH 1.773 02/08/2021: Magnesium 2.1 03/09/2021: ALT 23; BUN 13; Creatinine 0.6; Hemoglobin 10.2; Platelets 285; Potassium 4.0; Sodium 141  Personally reviewed   Wt Readings from Last 3 Encounters:  03/22/21 65.2 kg (143 lb 12.8 oz)  03/11/21 66.4 kg (146 lb 6.4 oz)  02/25/21 69.8 kg (153 lb 12.8 oz)    ASSESSMENT AND PLAN 1. Chronic Diastolic Heart Failure: Echo today with EF 50%, mildly dysfunctional RV. He does not look volume overloaded on exam today.  - He has been taking torsemide only prn, will continue this pattern.   2. 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  Baseline, he is bed-bound, left hemiparesis.   Also in 10/22 had new small left cerebellar infarct possibly embolic from endocarditis.  He is awake but minimally interactive today.  3. Enterococcal bacteremia: Source uncertain.  No PNA noted on CXR.  Enterococcus did not grow in urine.  Patient had bioprosthetic MV endocarditis by 10/22 TEE, valve function not markedly abnormal (mean gradient elevated at 8 with trivial [name]).  He had a  new left cerebellar CVA in 10/22 (small) that may have been embolic from MV vegetation.  With prior sternotomy and minimal mobility, he is not a candidate for redo valve surgery.  He completed 6 week course of ceftriaxone/ampicillin.  Today's echo shows vegetation still present on mitral valve with mean gradient 8 mmHg and mild [name] (not sure if sterile material or still active infection), and the aortic valve is thickened but not definitive vegetation. No fever.  - Continue amoxicillin long-term. - CBC and ESR today.  With the presence of vegetation still on the valve, I will send blood cultures. .  4. 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  Echo today showed bioprosthetic AVR mean gradient 9 mmHg with thickening present, bioprosthetic MV mean gradient 8 with vegetation still noted to be present.  5. Atrial fibrillation/Flutter: Maze in 2006 + 2 flutter/fib ablations at [facility]. Remains on decreased Tikosyn 375 mcg bid due to previously long QT. He is in NSR today with stable QTc.  - Continue Tikosyn 375 mcg bid.  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  - Continue Eliquis.  6. Hyperlipidemia: He is on atorvastatin.  7. HTN: BP is on the low side.  - Decrease amlodipine to 5 mg daily.   Loralie Champagne 04/01/2021  [facility] [address] [facility] [place] [address] [phone] (office) [phone] (fax)

## 2021-04-05 DIAGNOSIS — I69354 Hemiplegia and hemiparesis following cerebral infarction affecting left non-dominant side: Secondary | ICD-10-CM | POA: Diagnosis not present

## 2021-04-05 DIAGNOSIS — I5022 Chronic systolic (congestive) heart failure: Secondary | ICD-10-CM | POA: Diagnosis not present

## 2021-04-05 DIAGNOSIS — R41841 Cognitive communication deficit: Secondary | ICD-10-CM | POA: Diagnosis not present

## 2021-04-05 DIAGNOSIS — M6389 Disorders of muscle in diseases classified elsewhere, multiple sites: Secondary | ICD-10-CM | POA: Diagnosis not present

## 2021-04-05 DIAGNOSIS — I6601 Occlusion and stenosis of right middle cerebral artery: Secondary | ICD-10-CM | POA: Diagnosis not present

## 2021-04-05 DIAGNOSIS — S065X3S Traumatic subdural hemorrhage with loss of consciousness of 1 hour to 5 hours 59 minutes, sequela: Secondary | ICD-10-CM | POA: Diagnosis not present

## 2021-04-05 DIAGNOSIS — I69391 Dysphagia following cerebral infarction: Secondary | ICD-10-CM | POA: Diagnosis not present

## 2021-04-05 DIAGNOSIS — R41840 Attention and concentration deficit: Secondary | ICD-10-CM | POA: Diagnosis not present

## 2021-04-05 DIAGNOSIS — I5032 Chronic diastolic (congestive) heart failure: Secondary | ICD-10-CM | POA: Diagnosis not present

## 2021-04-05 DIAGNOSIS — G9341 Metabolic encephalopathy: Secondary | ICD-10-CM | POA: Diagnosis not present

## 2021-04-05 DIAGNOSIS — I38 Endocarditis, valve unspecified: Secondary | ICD-10-CM | POA: Diagnosis not present

## 2021-04-05 DIAGNOSIS — R293 Abnormal posture: Secondary | ICD-10-CM | POA: Diagnosis not present

## 2021-04-05 DIAGNOSIS — R278 Other lack of coordination: Secondary | ICD-10-CM | POA: Diagnosis not present

## 2021-04-05 DIAGNOSIS — I69812 Visuospatial deficit and spatial neglect following other cerebrovascular disease: Secondary | ICD-10-CM | POA: Diagnosis not present

## 2021-04-05 DIAGNOSIS — H538 Other visual disturbances: Secondary | ICD-10-CM | POA: Diagnosis not present

## 2021-04-05 LAB — BASIC METABOLIC PANEL
BUN: 17 (ref 4–21)
CO2: 26 — AB (ref 13–22)
Chloride: 106 (ref 99–108)
Creatinine: 0.6 (ref 0.6–1.3)
Glucose: 86
Potassium: 3.9 (ref 3.4–5.3)
Potassium: 3.9 (ref 3.4–5.3)
Sodium: 143 (ref 137–147)
Sodium: 143 (ref 137–147)

## 2021-04-05 LAB — CULTURE, BLOOD (SINGLE)
Culture: NO GROWTH
Special Requests: ADEQUATE

## 2021-04-05 LAB — COMPREHENSIVE METABOLIC PANEL: Calcium: 9.3 (ref 8.7–10.7)

## 2021-04-06 DIAGNOSIS — R41841 Cognitive communication deficit: Secondary | ICD-10-CM | POA: Diagnosis not present

## 2021-04-06 DIAGNOSIS — I69812 Visuospatial deficit and spatial neglect following other cerebrovascular disease: Secondary | ICD-10-CM | POA: Diagnosis not present

## 2021-04-06 DIAGNOSIS — S065X3S Traumatic subdural hemorrhage with loss of consciousness of 1 hour to 5 hours 59 minutes, sequela: Secondary | ICD-10-CM | POA: Diagnosis not present

## 2021-04-06 DIAGNOSIS — M6389 Disorders of muscle in diseases classified elsewhere, multiple sites: Secondary | ICD-10-CM | POA: Diagnosis not present

## 2021-04-06 DIAGNOSIS — I6601 Occlusion and stenosis of right middle cerebral artery: Secondary | ICD-10-CM | POA: Diagnosis not present

## 2021-04-06 DIAGNOSIS — I5022 Chronic systolic (congestive) heart failure: Secondary | ICD-10-CM | POA: Diagnosis not present

## 2021-04-06 DIAGNOSIS — I5032 Chronic diastolic (congestive) heart failure: Secondary | ICD-10-CM | POA: Diagnosis not present

## 2021-04-06 DIAGNOSIS — H538 Other visual disturbances: Secondary | ICD-10-CM | POA: Diagnosis not present

## 2021-04-06 DIAGNOSIS — G9341 Metabolic encephalopathy: Secondary | ICD-10-CM | POA: Diagnosis not present

## 2021-04-06 DIAGNOSIS — I69354 Hemiplegia and hemiparesis following cerebral infarction affecting left non-dominant side: Secondary | ICD-10-CM | POA: Diagnosis not present

## 2021-04-06 DIAGNOSIS — R293 Abnormal posture: Secondary | ICD-10-CM | POA: Diagnosis not present

## 2021-04-06 DIAGNOSIS — R278 Other lack of coordination: Secondary | ICD-10-CM | POA: Diagnosis not present

## 2021-04-06 DIAGNOSIS — I69391 Dysphagia following cerebral infarction: Secondary | ICD-10-CM | POA: Diagnosis not present

## 2021-04-06 DIAGNOSIS — R41840 Attention and concentration deficit: Secondary | ICD-10-CM | POA: Diagnosis not present

## 2021-04-07 DIAGNOSIS — M6389 Disorders of muscle in diseases classified elsewhere, multiple sites: Secondary | ICD-10-CM | POA: Diagnosis not present

## 2021-04-07 DIAGNOSIS — I69391 Dysphagia following cerebral infarction: Secondary | ICD-10-CM | POA: Diagnosis not present

## 2021-04-07 DIAGNOSIS — I69354 Hemiplegia and hemiparesis following cerebral infarction affecting left non-dominant side: Secondary | ICD-10-CM | POA: Diagnosis not present

## 2021-04-07 DIAGNOSIS — S065X3S Traumatic subdural hemorrhage with loss of consciousness of 1 hour to 5 hours 59 minutes, sequela: Secondary | ICD-10-CM | POA: Diagnosis not present

## 2021-04-07 DIAGNOSIS — H538 Other visual disturbances: Secondary | ICD-10-CM | POA: Diagnosis not present

## 2021-04-07 DIAGNOSIS — I5022 Chronic systolic (congestive) heart failure: Secondary | ICD-10-CM | POA: Diagnosis not present

## 2021-04-07 DIAGNOSIS — R41841 Cognitive communication deficit: Secondary | ICD-10-CM | POA: Diagnosis not present

## 2021-04-07 DIAGNOSIS — I69812 Visuospatial deficit and spatial neglect following other cerebrovascular disease: Secondary | ICD-10-CM | POA: Diagnosis not present

## 2021-04-07 DIAGNOSIS — I5032 Chronic diastolic (congestive) heart failure: Secondary | ICD-10-CM | POA: Diagnosis not present

## 2021-04-07 DIAGNOSIS — I6601 Occlusion and stenosis of right middle cerebral artery: Secondary | ICD-10-CM | POA: Diagnosis not present

## 2021-04-07 DIAGNOSIS — R278 Other lack of coordination: Secondary | ICD-10-CM | POA: Diagnosis not present

## 2021-04-07 DIAGNOSIS — R41840 Attention and concentration deficit: Secondary | ICD-10-CM | POA: Diagnosis not present

## 2021-04-07 DIAGNOSIS — G9341 Metabolic encephalopathy: Secondary | ICD-10-CM | POA: Diagnosis not present

## 2021-04-07 DIAGNOSIS — R293 Abnormal posture: Secondary | ICD-10-CM | POA: Diagnosis not present

## 2021-04-08 DIAGNOSIS — I69354 Hemiplegia and hemiparesis following cerebral infarction affecting left non-dominant side: Secondary | ICD-10-CM | POA: Diagnosis not present

## 2021-04-08 DIAGNOSIS — M6389 Disorders of muscle in diseases classified elsewhere, multiple sites: Secondary | ICD-10-CM | POA: Diagnosis not present

## 2021-04-08 DIAGNOSIS — I6601 Occlusion and stenosis of right middle cerebral artery: Secondary | ICD-10-CM | POA: Diagnosis not present

## 2021-04-08 DIAGNOSIS — I69391 Dysphagia following cerebral infarction: Secondary | ICD-10-CM | POA: Diagnosis not present

## 2021-04-08 DIAGNOSIS — G9341 Metabolic encephalopathy: Secondary | ICD-10-CM | POA: Diagnosis not present

## 2021-04-08 DIAGNOSIS — S065X3S Traumatic subdural hemorrhage with loss of consciousness of 1 hour to 5 hours 59 minutes, sequela: Secondary | ICD-10-CM | POA: Diagnosis not present

## 2021-04-08 DIAGNOSIS — R41840 Attention and concentration deficit: Secondary | ICD-10-CM | POA: Diagnosis not present

## 2021-04-08 DIAGNOSIS — R293 Abnormal posture: Secondary | ICD-10-CM | POA: Diagnosis not present

## 2021-04-08 DIAGNOSIS — R278 Other lack of coordination: Secondary | ICD-10-CM | POA: Diagnosis not present

## 2021-04-08 DIAGNOSIS — I69812 Visuospatial deficit and spatial neglect following other cerebrovascular disease: Secondary | ICD-10-CM | POA: Diagnosis not present

## 2021-04-08 DIAGNOSIS — I5032 Chronic diastolic (congestive) heart failure: Secondary | ICD-10-CM | POA: Diagnosis not present

## 2021-04-08 DIAGNOSIS — H538 Other visual disturbances: Secondary | ICD-10-CM | POA: Diagnosis not present

## 2021-04-08 DIAGNOSIS — I5022 Chronic systolic (congestive) heart failure: Secondary | ICD-10-CM | POA: Diagnosis not present

## 2021-04-08 DIAGNOSIS — R41841 Cognitive communication deficit: Secondary | ICD-10-CM | POA: Diagnosis not present

## 2021-04-09 DIAGNOSIS — H538 Other visual disturbances: Secondary | ICD-10-CM | POA: Diagnosis not present

## 2021-04-09 DIAGNOSIS — I69354 Hemiplegia and hemiparesis following cerebral infarction affecting left non-dominant side: Secondary | ICD-10-CM | POA: Diagnosis not present

## 2021-04-09 DIAGNOSIS — I5032 Chronic diastolic (congestive) heart failure: Secondary | ICD-10-CM | POA: Diagnosis not present

## 2021-04-09 DIAGNOSIS — G9341 Metabolic encephalopathy: Secondary | ICD-10-CM | POA: Diagnosis not present

## 2021-04-09 DIAGNOSIS — R41840 Attention and concentration deficit: Secondary | ICD-10-CM | POA: Diagnosis not present

## 2021-04-09 DIAGNOSIS — I69391 Dysphagia following cerebral infarction: Secondary | ICD-10-CM | POA: Diagnosis not present

## 2021-04-09 DIAGNOSIS — S065X3S Traumatic subdural hemorrhage with loss of consciousness of 1 hour to 5 hours 59 minutes, sequela: Secondary | ICD-10-CM | POA: Diagnosis not present

## 2021-04-09 DIAGNOSIS — I6601 Occlusion and stenosis of right middle cerebral artery: Secondary | ICD-10-CM | POA: Diagnosis not present

## 2021-04-09 DIAGNOSIS — I69812 Visuospatial deficit and spatial neglect following other cerebrovascular disease: Secondary | ICD-10-CM | POA: Diagnosis not present

## 2021-04-09 DIAGNOSIS — R278 Other lack of coordination: Secondary | ICD-10-CM | POA: Diagnosis not present

## 2021-04-09 DIAGNOSIS — M6389 Disorders of muscle in diseases classified elsewhere, multiple sites: Secondary | ICD-10-CM | POA: Diagnosis not present

## 2021-04-09 DIAGNOSIS — R293 Abnormal posture: Secondary | ICD-10-CM | POA: Diagnosis not present

## 2021-04-09 DIAGNOSIS — R41841 Cognitive communication deficit: Secondary | ICD-10-CM | POA: Diagnosis not present

## 2021-04-09 DIAGNOSIS — I5022 Chronic systolic (congestive) heart failure: Secondary | ICD-10-CM | POA: Diagnosis not present

## 2021-04-12 DIAGNOSIS — R41840 Attention and concentration deficit: Secondary | ICD-10-CM | POA: Diagnosis not present

## 2021-04-12 DIAGNOSIS — R293 Abnormal posture: Secondary | ICD-10-CM | POA: Diagnosis not present

## 2021-04-12 DIAGNOSIS — I5032 Chronic diastolic (congestive) heart failure: Secondary | ICD-10-CM | POA: Diagnosis not present

## 2021-04-12 DIAGNOSIS — R278 Other lack of coordination: Secondary | ICD-10-CM | POA: Diagnosis not present

## 2021-04-12 DIAGNOSIS — G9341 Metabolic encephalopathy: Secondary | ICD-10-CM | POA: Diagnosis not present

## 2021-04-12 DIAGNOSIS — I69812 Visuospatial deficit and spatial neglect following other cerebrovascular disease: Secondary | ICD-10-CM | POA: Diagnosis not present

## 2021-04-12 DIAGNOSIS — I6601 Occlusion and stenosis of right middle cerebral artery: Secondary | ICD-10-CM | POA: Diagnosis not present

## 2021-04-12 DIAGNOSIS — R41841 Cognitive communication deficit: Secondary | ICD-10-CM | POA: Diagnosis not present

## 2021-04-12 DIAGNOSIS — S065X3S Traumatic subdural hemorrhage with loss of consciousness of 1 hour to 5 hours 59 minutes, sequela: Secondary | ICD-10-CM | POA: Diagnosis not present

## 2021-04-12 DIAGNOSIS — I38 Endocarditis, valve unspecified: Secondary | ICD-10-CM | POA: Diagnosis not present

## 2021-04-12 DIAGNOSIS — H538 Other visual disturbances: Secondary | ICD-10-CM | POA: Diagnosis not present

## 2021-04-12 DIAGNOSIS — M6389 Disorders of muscle in diseases classified elsewhere, multiple sites: Secondary | ICD-10-CM | POA: Diagnosis not present

## 2021-04-12 DIAGNOSIS — I69391 Dysphagia following cerebral infarction: Secondary | ICD-10-CM | POA: Diagnosis not present

## 2021-04-12 DIAGNOSIS — I5022 Chronic systolic (congestive) heart failure: Secondary | ICD-10-CM | POA: Diagnosis not present

## 2021-04-12 DIAGNOSIS — I69354 Hemiplegia and hemiparesis following cerebral infarction affecting left non-dominant side: Secondary | ICD-10-CM | POA: Diagnosis not present

## 2021-04-12 LAB — COMPREHENSIVE METABOLIC PANEL: Calcium: 9.3 (ref 8.7–10.7)

## 2021-04-12 LAB — BASIC METABOLIC PANEL
BUN: 19 (ref 4–21)
CO2: 26 — AB (ref 13–22)
Chloride: 108 (ref 99–108)
Creatinine: 0.6 (ref 0.6–1.3)
Glucose: 96
Potassium: 3.5 (ref 3.4–5.3)
Sodium: 144 (ref 137–147)

## 2021-04-13 DIAGNOSIS — I69354 Hemiplegia and hemiparesis following cerebral infarction affecting left non-dominant side: Secondary | ICD-10-CM | POA: Diagnosis not present

## 2021-04-13 DIAGNOSIS — I6601 Occlusion and stenosis of right middle cerebral artery: Secondary | ICD-10-CM | POA: Diagnosis not present

## 2021-04-13 DIAGNOSIS — I5032 Chronic diastolic (congestive) heart failure: Secondary | ICD-10-CM | POA: Diagnosis not present

## 2021-04-13 DIAGNOSIS — R41841 Cognitive communication deficit: Secondary | ICD-10-CM | POA: Diagnosis not present

## 2021-04-13 DIAGNOSIS — I69391 Dysphagia following cerebral infarction: Secondary | ICD-10-CM | POA: Diagnosis not present

## 2021-04-14 DIAGNOSIS — R41841 Cognitive communication deficit: Secondary | ICD-10-CM | POA: Diagnosis not present

## 2021-04-14 DIAGNOSIS — I6601 Occlusion and stenosis of right middle cerebral artery: Secondary | ICD-10-CM | POA: Diagnosis not present

## 2021-04-14 DIAGNOSIS — I69354 Hemiplegia and hemiparesis following cerebral infarction affecting left non-dominant side: Secondary | ICD-10-CM | POA: Diagnosis not present

## 2021-04-14 DIAGNOSIS — I69391 Dysphagia following cerebral infarction: Secondary | ICD-10-CM | POA: Diagnosis not present

## 2021-04-14 DIAGNOSIS — I5032 Chronic diastolic (congestive) heart failure: Secondary | ICD-10-CM | POA: Diagnosis not present

## 2021-04-15 ENCOUNTER — Non-Acute Institutional Stay (SKILLED_NURSING_FACILITY): Payer: Medicare Other | Admitting: Adult Health

## 2021-04-15 ENCOUNTER — Encounter: Payer: Self-pay | Admitting: Adult Health

## 2021-04-15 DIAGNOSIS — E870 Hyperosmolality and hypernatremia: Secondary | ICD-10-CM

## 2021-04-15 DIAGNOSIS — I38 Endocarditis, valve unspecified: Secondary | ICD-10-CM | POA: Diagnosis not present

## 2021-04-15 DIAGNOSIS — I10 Essential (primary) hypertension: Secondary | ICD-10-CM

## 2021-04-15 DIAGNOSIS — E118 Type 2 diabetes mellitus with unspecified complications: Secondary | ICD-10-CM

## 2021-04-15 DIAGNOSIS — I482 Chronic atrial fibrillation, unspecified: Secondary | ICD-10-CM

## 2021-04-15 DIAGNOSIS — I6601 Occlusion and stenosis of right middle cerebral artery: Secondary | ICD-10-CM | POA: Diagnosis not present

## 2021-04-15 DIAGNOSIS — E782 Mixed hyperlipidemia: Secondary | ICD-10-CM

## 2021-04-15 DIAGNOSIS — R41840 Attention and concentration deficit: Secondary | ICD-10-CM | POA: Diagnosis not present

## 2021-04-15 DIAGNOSIS — H538 Other visual disturbances: Secondary | ICD-10-CM | POA: Diagnosis not present

## 2021-04-15 DIAGNOSIS — R293 Abnormal posture: Secondary | ICD-10-CM | POA: Diagnosis not present

## 2021-04-15 DIAGNOSIS — I5022 Chronic systolic (congestive) heart failure: Secondary | ICD-10-CM | POA: Diagnosis not present

## 2021-04-15 DIAGNOSIS — I5032 Chronic diastolic (congestive) heart failure: Secondary | ICD-10-CM | POA: Diagnosis not present

## 2021-04-15 DIAGNOSIS — I69812 Visuospatial deficit and spatial neglect following other cerebrovascular disease: Secondary | ICD-10-CM | POA: Diagnosis not present

## 2021-04-15 DIAGNOSIS — D638 Anemia in other chronic diseases classified elsewhere: Secondary | ICD-10-CM | POA: Diagnosis not present

## 2021-04-15 DIAGNOSIS — N401 Enlarged prostate with lower urinary tract symptoms: Secondary | ICD-10-CM | POA: Diagnosis not present

## 2021-04-15 DIAGNOSIS — I69354 Hemiplegia and hemiparesis following cerebral infarction affecting left non-dominant side: Secondary | ICD-10-CM

## 2021-04-15 DIAGNOSIS — M6389 Disorders of muscle in diseases classified elsewhere, multiple sites: Secondary | ICD-10-CM | POA: Diagnosis not present

## 2021-04-15 DIAGNOSIS — R41841 Cognitive communication deficit: Secondary | ICD-10-CM | POA: Diagnosis not present

## 2021-04-15 DIAGNOSIS — N138 Other obstructive and reflux uropathy: Secondary | ICD-10-CM | POA: Diagnosis not present

## 2021-04-15 DIAGNOSIS — R278 Other lack of coordination: Secondary | ICD-10-CM | POA: Diagnosis not present

## 2021-04-15 DIAGNOSIS — S069X3S Unspecified intracranial injury with loss of consciousness of 1 hour to 5 hours 59 minutes, sequela: Secondary | ICD-10-CM | POA: Diagnosis not present

## 2021-04-15 DIAGNOSIS — T826XXD Infection and inflammatory reaction due to cardiac valve prosthesis, subsequent encounter: Secondary | ICD-10-CM | POA: Diagnosis not present

## 2021-04-15 DIAGNOSIS — S065X3S Traumatic subdural hemorrhage with loss of consciousness of 1 hour to 5 hours 59 minutes, sequela: Secondary | ICD-10-CM | POA: Diagnosis not present

## 2021-04-15 DIAGNOSIS — G9341 Metabolic encephalopathy: Secondary | ICD-10-CM | POA: Diagnosis not present

## 2021-04-15 DIAGNOSIS — I69391 Dysphagia following cerebral infarction: Secondary | ICD-10-CM | POA: Diagnosis not present

## 2021-04-15 NOTE — Progress Notes (Signed)
Location:    [place] Room Number: [place] of Service:  SNF [phone]) Provider: Royal Hawthorn, NP    [name], MD  Patient Care Team: [name], MD as PCP - General (Internal Medicine) Larey Dresser, MD as PCP - Cardiology (Cardiology) [name], MD as Consulting Physician (Ophthalmology) [name], MD (Internal Medicine)  Extended Emergency Contact Information Primary Emergency Contact: Dorthula [name] Address: [place], [place] [address] [name] [name] of [place] Phone: [phone] Relation: Spouse Secondary Emergency Contact: [name], [place] [address] [name] [name] of [place] Phone: [phone] Relation: Son  Code Status:  DNR Goals of care: Advanced Directive information Advanced Directives 04/15/2021  Does Patient Have a Medical Advance Directive? Yes  Type of Paramedic of [place];Living will;Out of facility DNR (pink MOST or yellow form)  Does patient want to make changes to medical advance directive? No - Patient declined  Copy of [place] in Chart? Yes - validated most recent copy scanned in chart (See row information)  Would patient like information on creating a medical advance directive? -  Pre-existing out of facility DNR order (yellow form or pink MOST form) -     Chief Complaint  Patient presents with   Medical Management of Chronic Issues   Quality Metric Gaps    FOOT EXAM (Yearly)    OPHTHALMOLOGY EXAM (Yearly) URINE MICROALBUMIN [facility]/Matrix verified     HPI:  Pt is a 81 y.o. male seen today for medical management of chronic diseases.  PMH includes syncope with fall in May of 2022, TBI with SDH with craniotomy, acute right MCA s/p thrombectomy, mitral and aortic valve replacement, DM II, and afib.  Hospitalized in Oct with enterococcal bacteremia with prosthetic valve endocarditis. Also had another CVA  with septic emboli. Blood cx grew enterococcus faecalis and urine culture grew staph aureus. Treated with IV antibioticsand now  he will take amoxicillin 500 mg bid indefinitely.  His wife asked to see me to go over his labs. His NA is WNL but trending upward to 144, BUN/Cr ratio rising slightly. She is not sure that he is drinking enough water and in the past when this happens he gets hypernatremic. He is sleeping most of the day. No issues with tremors. Weight is trending down due to decreased intake 155 in Dec now 143.  Works with [employer], on [address]. NO issues with choking or coughing.  He has PVRs done and I/O cath if >400cc. He has not needed catheterization this week but did last week. No urinary symptoms.  He was seen by cardiology 03/31/21 and had a repeat echo which did show vegetation on the mitral valve. Dr. [name] ordered blood cultures x 2 which were negative. Ef 45-50% Mitral and aortic valve replacements, aortic dilatation noted. Was in NSR on tikosyn. BP was on the low side so norvasc was decreased.    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    Other and unspecified hyperlipidemia    Pre-diabetes    Previous back surgery 1978, jan 2007   Psychosexual dysfunction with inhibited sexual excitement    SOB (shortness of breath)    "with heavy exercise"   Stroke ([place]) 08/2013   "I WAS IN AFIB AND THREW A CLOT, THE EFFECTS WERE TRANSITORY AND DIDNT LAST BUT FOR 30 MINUTES"    Thoracic aortic aneurysm     Past Surgical History:  Procedure Laterality Date   AORTIC AND MITRAL VALVE REPLACEMENT     09/2004   CARDIOVERSION     3 times from 2004-2006   CARDIOVERSION N/A 09/26/2013   Procedure: CARDIOVERSION;  Surgeon: Larey Dresser, MD;  Location: [facility];  Service: Cardiovascular;  Laterality: N/A;   CARDIOVERSION N/A 06/19/2014   Procedure: CARDIOVERSION;  Surgeon: Jerline Pain, MD;  Location: [facility];  Service: Cardiovascular;  Laterality: N/A;   CARDIOVERSION N/A 04/24/2017   Procedure: CARDIOVERSION;  Surgeon: Larey Dresser, MD;  Location: [facility];  Service: Cardiovascular;  Laterality: N/A;   CARDIOVERSION N/A 06/08/2017   Procedure: CARDIOVERSION;  Surgeon: Pixie Casino, MD;  Location: [facility] ENDOSCOPY;  Service: Cardiovascular;  Laterality: N/A;   CARDIOVERSION N/A 08/24/2017   Procedure: CARDIOVERSION;  Surgeon: Skeet Latch, MD;  Location: [facility] ENDOSCOPY;  Service: Cardiovascular;  Laterality: N/A;   CARDIOVERSION N/A 02/14/2018   Procedure: CARDIOVERSION;  Surgeon: Larey Dresser, MD;  Location: [name] ENDOSCOPY;  Service: Cardiovascular;  Laterality: N/A;   COLONOSCOPY     CRANIOTOMY N/A 07/28/2020   Procedure: CRANIOTOMY FOR EVACUATION OF SUBDURAL HEMATOMA;  Surgeon: Eustace Goris, MD;  Location: [facility];  Service: Neurosurgery;  Laterality: N/A;   IR ANGIO VERTEBRAL SEL SUBCLAVIAN INNOMINATE UNI R MOD SED  08/07/2020   IR CT HEAD LTD  08/07/2020   IR PERCUTANEOUS ART THROMBECTOMY/INFUSION INTRACRANIAL INC DIAG ANGIO  08/07/2020   laminectomies     10/1975 and in 03/2005   RADIOLOGY WITH ANESTHESIA N/A 08/07/2020   Procedure: IR WITH ANESTHESIA;  Surgeon: Luanne Bras, MD;  Location: [facility];  Service: Radiology;  Laterality: N/A;   TEE WITHOUT CARDIOVERSION N/A 12/18/2020   Procedure: TRANSESOPHAGEAL ECHOCARDIOGRAM (TEE);  Surgeon: Larey Dresser, MD;  Location: [facility] ENDOSCOPY;  Service: Cardiovascular;  Laterality: N/A;   [place] Right 04/03/2018   Procedure: TOTAL HIP ARTHROPLASTY ANTERIOR APPROACH;  Surgeon: Paralee Cancel, MD;  Location: [facility];  Service: Orthopedics;  Laterality: Right;  70 mins    Allergies  Allergen Reactions   Naproxen Hives   No [facility] Food Allergies Other (See Comments)    Scallops - distress, nausea and vomitting    Allergies as of 04/15/2021       Reactions   Naproxen Hives   No [facility] Food Allergies Other (See Comments)   Scallops - distress, nausea and vomitting        Medication List        Accurate as of April 15, 2021 12:24 PM. If you have any questions, ask your nurse or doctor.          STOP taking these medications    BIOTENE DRY MOUTH MT Stopped by: Royal Hawthorn, NP   potassium chloride 10 MEQ tablet Commonly known  asRhetta Mura Stopped by: Royal Hawthorn, NP       TAKE these medications    amantadine 100 MG capsule Commonly known as: SYMMETREL Take 100 mg by mouth 2 (two) times daily.   amLODipine 10 MG tablet Commonly known as: NORVASC Take 0.5 tablets (5 mg total) by mouth daily.   amoxicillin 400 MG/5ML suspension Commonly known as: AMOXIL Take 6.3 mLs (500 mg total) by mouth 2 (two) times daily. Please flavor.  Duration to be determined by infectious disease MD during outpatient follow-up.   apixaban 5 MG Tabs tablet Commonly known as: ELIQUIS Take 1 tablet (5 mg total) by mouth 2 (two) times daily.   atorvastatin 80 MG tablet Commonly known as: LIPITOR Take 1 tablet (80 mg total) by mouth daily.   BIOTENE MOISTURIZING MOUTH MT Use as directed 2 sprays in the mouth or throat in the morning, at noon, and at bedtime.   bisacodyl 10 MG suppository Commonly known as: DULCOLAX Place 1 suppository (10 mg total) rectally daily as needed for moderate constipation or mild constipation.   clobetasol cream 0.05 % Commonly known as: TEMOVATE Apply 1 application topically daily as needed (for skin irritation).    dofetilide 125 MCG capsule Commonly known as: TIKOSYN Take 3 capsules (375 mcg total) by mouth 2 (two) times daily.   famotidine 20 MG tablet Commonly known as: PEPCID Take 20 mg by mouth at bedtime as needed for indigestion.   finasteride 5 MG tablet Commonly known as: PROSCAR Take 1 tablet (5 mg total) by mouth daily.   hydrocortisone cream 1 % Apply topically 2 (two) times daily as needed for itching (rash).   lacosamide 50 MG Tabs tablet Commonly known as: VIMPAT Take 1 tablet (50 mg total) by mouth 2 (two) times daily.   levalbuterol 0.63 MG/3ML nebulizer solution Commonly known as: XOPENEX Take 0.63 mg by nebulization every 6 (six) hours as needed for wheezing or shortness of breath.   levalbuterol 0.63 MG/3ML nebulizer solution Commonly known as: XOPENEX Take 0.63 mg by nebulization as needed for wheezing or shortness of breath.   magnesium gluconate 500 MG tablet Commonly known as: MAGONATE Take 1 tablet (500 mg total) by mouth daily.   melatonin 5 MG Tabs Take 5 mg by mouth at bedtime.   methenamine 1 g tablet Commonly known as: HIPREX Take 1 g by mouth 2 (two) times daily with a meal.   multivitamin with minerals Tabs tablet Take 1 tablet by mouth daily.   polyethylene glycol 17 g packet Commonly known as: MIRALAX / GLYCOLAX Take 17 g by mouth daily.   senna-docusate 8.6-50 MG tablet Commonly known as: Senokot-S Take 1 tablet by mouth at bedtime as needed.   Systane 0.4-0.3 % Soln Generic drug: Polyethyl Glycol-Propyl Glycol Place 2 drops into both eyes daily.   torsemide 20 MG tablet Commonly known as: DEMADEX Take 20 mg by mouth daily as needed.        Review of Systems  Unable to perform ROS: Patient nonverbal   Immunization History  Administered Date(s) Administered   Fluad Quad(high Dose 65+) 12/06/2018, 12/09/2020   Influenza Whole 12/25/2008, 01/05/2010, 12/30/2011   Influenza, High Dose Seasonal PF 12/14/2016    Influenza,inj,Quad PF,6+ Mos 12/19/2013   Influenza,inj,quad, With Preservative 11/22/2017   Influenza-Unspecified 12/14/2015, 12/24/2019   Moderna Covid-19 Vaccine Bivalent Booster 16yrs & up 01/12/2021   Moderna SARS-COV2 Booster Vaccination 01/02/2020, 06/26/2020   Moderna Sars-Covid-2 Vaccination 04/08/2019, 05/05/2019   Pneumococcal Conjugate-13 12/19/2013  Pneumococcal Polysaccharide-23 09/24/2007, 05/22/2017   Td 12/25/2008   Tdap 03/22/2018   Zoster Recombinat (Shingrix) 07/16/2019, 10/28/2019   Zoster, Live 09/24/2007   Pertinent  Health Maintenance Due  Topic Date Due   FOOT EXAM  Never done   OPHTHALMOLOGY EXAM  Never done   URINE MICROALBUMIN  Never done   HEMOGLOBIN A1C  06/17/2021   INFLUENZA VACCINE  Completed   COLONOSCOPY (Pts 45-66yrs Insurance coverage will need to be confirmed)  Discontinued   Fall Risk 01/26/2021 01/26/2021 01/26/2021 01/27/2021 02/24/2021  Falls in the past year? - - - - 0  Was there an injury with Fall? - - - - 0  Fall Risk Category Calculator - - - - 0  Fall Risk Category - - - - Low  Patient Fall Risk Level High fall risk High fall risk High fall risk High fall risk High fall risk  Patient at Risk for Falls Due to - - - - Impaired mobility  Patient at Risk for Falls Due to - - - - -  Fall risk Follow up - - - - Falls evaluation completed   Functional Status Survey:    Vitals:   04/15/21 1135  BP: 124/77  Pulse: 78  Resp: 17  Temp: (!) 97.3 F (36.3 C)  SpO2: 98%  Weight: 143 lb 6.4 oz (65 kg)  Height: 5\' 7"  (1.702 m)   Body mass index is 22.46 kg/m. Physical Exam Vitals reviewed.  Constitutional:      General: He is not in acute distress.    Appearance: He is not diaphoretic.  HENT:     Head: Normocephalic and atraumatic.     Nose: Nose normal. No congestion.     Mouth/Throat:     Mouth: Mucous membranes are moist.     Pharynx: Oropharynx is clear.  Eyes:     Conjunctiva/sclera: Conjunctivae normal.     Pupils:  Pupils are equal, round, and reactive to light.  Neck:     Thyroid: No thyromegaly.     Vascular: No JVD.     Trachea: No tracheal deviation.  Cardiovascular:     Rate and Rhythm: Normal rate and regular rhythm.     Heart sounds: No murmur heard. Pulmonary:     Effort: Pulmonary effort is normal. No respiratory distress.     Breath sounds: Normal breath sounds. No wheezing.  Abdominal:     General: Bowel sounds are normal. There is no distension.     Palpations: Abdomen is soft.     Tenderness: There is no abdominal tenderness.  Musculoskeletal:     Right lower leg: No edema.     Left lower leg: No edema.  Lymphadenopathy:     Cervical: No cervical adenopathy.  Skin:    General: Skin is warm and dry.     Coloration: Skin is pale.  Neurological:     Comments: Minimal arousal on exam. Not able to f/c or answer questions. Has left sided weakness.     Labs reviewed: Recent Labs    12/18/20 1630 12/19/20 0610 12/19/20 1632 12/20/20 0153 01/25/21 5102 01/25/21 0853 01/26/21 0435 01/27/21 0538 02/01/21 0000 02/08/21 0000 02/09/21 0000 03/29/21 0000 04/05/21 0000 04/12/21 0000  NA  --  139  --    < > 143  --  143 140   < > 150*   < > 139 143   143 144  K  --  3.5  --    < > 3.6  --  3.8 3.5   < > 3.7   < > 3.7 3.9   3.9 3.5  CL  --  108  --    < > 107  --  104 107   < > 107   < > 103 106 108  CO2  --  23  --    < > 27  --  26 25   < > 22   < > 24* 26* 26*  GLUCOSE  --  186*  --    < > 93  --  105* 137*  --   --   --   --   --   --   BUN  --  16  --    < > 11  --  12 15   < > 23*   < > 14 17 19   CREATININE  --  0.76  --    < > 0.94  --  0.95 1.06   < > 0.9   < > 0.6 0.6 0.6  CALCIUM  --  7.7*  --    < > 8.2*  --  8.7* 8.7*   < > 9.9   < > 9.3 9.3 9.3  MG 1.9 2.0 1.9   < >  --    < > 2.2 2.4  --  2.1  --   --   --   --   PHOS 3.1 2.7 2.4*  --   --   --   --   --   --   --   --   --   --   --    < > = values in this interval not displayed.   Recent Labs    01/03/21 0221  01/08/21 0000 01/20/21 1631 01/23/21 1355 02/08/21 0000 02/22/21 0000 02/23/21 0000 03/09/21 0000  AST 32   < > 27 37   < > 33 32 23  ALT 63*   < > 35 34   < > 36 35 23  ALKPHOS 94   < > 115 107   < > 226* 244* 230*  BILITOT 0.4  --  0.6 0.7  --   --   --   --   PROT 6.3*  --  6.9 6.9  --   --   --   --   ALBUMIN 1.9*   < > 2.5* 2.4*   < > 3.3* 3.5 3.6   < > = values in this interval not displayed.   Recent Labs    08/16/20 0427 08/17/20 0340 12/14/20 1245 12/14/20 1504 12/16/20 0130 12/18/20 0221 01/24/21 0154 01/25/21 0623 01/26/21 0435 02/01/21 0000 02/05/21 1111 02/08/21 0000 02/22/21 0000 03/04/21 0000 03/09/21 0000  WBC 7.9   < > 12.0*   < > 10.7*   < > 10.0  --  10.4   < > 9.8   < > 9.6 10.1 9.7  NEUTROABS 5.7  --  9.3*  --  7.7  --   --   --   --   --   --   --   --   --   --   HGB 9.0*   < > 11.7*   < > 9.9*   < > 7.3*   < > 7.9*   < > 9.8*   < > 9.5* 10.6* 10.2*  HCT 28.2*   < > 36.8*   < > 30.7*   < > 23.3*   < >  25.9*   < > 31.8*   < > 31* 34* 32*  MCV 98.6   < > 90.0   < > 88.0   < > 88.3  --  88.7  --  85.0  --   --   --   --   PLT 177   < > 119*   < > 106*   < > 395  --  417*   < > 462*   < > 272 313 285   < > = values in this interval not displayed.   Lab Results  Component Value Date   TSH 1.773 01/24/2021   Lab Results  Component Value Date   HGBA1C 7.3 (H) 12/17/2020   Lab Results  Component Value Date   CHOL 119 08/08/2020   HDL 42 08/08/2020   LDLCALC 56 08/08/2020   TRIG 105 08/08/2020   CHOLHDL 2.8 08/08/2020    Significant Diagnostic Results in last 30 days:  ECHOCARDIOGRAM COMPLETE  Result Date: 03/31/2021    ECHOCARDIOGRAM REPORT   Patient Name:   [name] [name] Date of Exam: 03/31/2021 Medical Rec #:  [id number]     Height:       67.0 in Accession #:    [id number]    Weight:       143.8 lb Date of Birth:  [birth year]     BSA:          1.758 m Patient Age:    81 years      BP:           113/69 mmHg Patient Gender: M             HR:            86 bpm. Exam Location:  Outpatient Procedure: 2D Echo, Cardiac Doppler and Color Doppler Indications:    Endocarditis.  History:        Patient has prior history of Echocardiogram examinations, most                 recent 12/18/2020. Abnormal ECG, Stroke, Mitral Valve Disease                 and Aortic Valve Disease, Arrythmias:Atrial Fibrillation and                 Atrial Flutter, Signs/Symptoms:Bacteremia; Risk                 Factors:Hypertension and Diabetes. Rheumatic fever. Mitral and                 aortic valve replacement.                 Aortic Valve: 27 mm Carpentier valve is present in the aortic                 position. Procedure Date: 10/18/04.                 Mitral Valve: unknown size prosthetic valve is present in the                 mitral position.  Sonographer:    Roseanna Rainbow RDCS Referring Phys: [place] Comments: Technically difficult study due to poor echo windows. Extremely difficult study due to patient being in chair. Patient had left arm strapped to chair. Extremely painful scanning position for sonographer. IMPRESSIONS  1. Technically difficult study due to poor echo windows.  2. Left ventricular ejection fraction,  by estimation, is 45 to 50%. The left ventricle has mildly reduced function. Difficult to assess wall motion due to poor acoustic windows. Based on limited views, the basal septal segments appear hypokineitc. There  is mild-to-moderate concentric left ventricular hypertrophy. Diastolic function indeterminant due to MVR.  3. Right ventricular systolic function is mildly reduced. The right ventricular size is normal.  4. The mitral valve has been repaired/replaced. There is a bioprosthetic mitral valve present. Mean gradient 52mmHg at HR 85bpm. LVOT VTI suboptimal however suspect there is a degree of bioprosthetic stenosis. There is mild mitral valve regurgitation.  5. The aortic valve has been repaired/replaced. Aortic valve regurgitation is  trivial. There is a 27 mm Carpentier valve present in the aortic position. Procedure Date: 10/18/04. Mean gradient 42mmHg, Vmax 2.2 m/s. LVOT VTI suboptimal to calculate DI/AVA.  6. Aortic dilatation noted. 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  7. The inferior vena cava is normal in size with greater than 50% respiratory variability, suggesting right atrial pressure of 3 mmHg. Comparison(s): Compared to prior TTE in 12/2020, the EF appears less robust at 45-50% compared to 65% on prior study. MV gradient remains elevated at 35mmHg (previously 61mmHg at HR 101). The aortic valve gradient is less on current study at 14mmHg (previously 66mmHg) but in the setting of reduced LV systolic function. FINDINGS  Left Ventricle: The basal septal segments appear hypokinetic. Left ventricular ejection fraction, by estimation, is 45 to 50%. The left ventricle has mildly decreased function. The left ventricle demonstrates regional wall motion abnormalities. The left  ventricular internal cavity size was normal in size. There is mild concentric left ventricular hypertrophy. Abnormal (paradoxical) septal motion consistent with post-operative status. Diastolic function indeterminant due to MVR. Right Ventricle: The right ventricular size is normal. No increase in right ventricular wall thickness. Right ventricular systolic function is mildly reduced. Left Atrium: Left atrial size was not well visualized. Right Atrium: Right atrial size was normal in size. Pericardium: There is no evidence of pericardial effusion. Mitral Valve: The mitral valve has been repaired/replaced. Mild mitral valve regurgitation. There is a unknown size prosthetic present in the mitral position. Mild mitral valve stenosis. MV peak gradient, 19.2 mmHg. The mean mitral valve gradient is 8.0 mmHg. Tricuspid Valve: The tricuspid valve is normal in structure. Tricuspid valve regurgitation is trivial. Aortic Valve: The aortic valve has been  repaired/replaced. Aortic valve regurgitation is trivial. Aortic valve mean gradient measures 8.5 mmHg. Aortic valve peak gradient measures 17.3 mmHg. Aortic valve area, by VTI measures 2.92 cm. There is a 27 mm Carpentier valve present in the aortic position. Procedure Date: 10/18/04. Pulmonic Valve: The pulmonic valve was normal in structure. Pulmonic valve regurgitation is trivial. Aorta: Aortic dilatation noted. 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 Venous: The inferior vena cava is normal in size with greater than 50% respiratory variability, suggesting right atrial pressure of 3 mmHg. IAS/Shunts: The atrial septum is grossly normal.  LEFT VENTRICLE PLAX 2D LVIDd:         4.50 cm     Diastology LVIDs:         3.20 cm     LV e' medial:    9.57 cm/s LV PW:         1.30 cm     LV E/e' medial:  19.5 LV IVS:        1.20 cm     LV e' lateral:   10.60 cm/s LVOT  diam:     3.10 cm     LV E/e' lateral: 17.6 LV SV:         91 LV SV Index:   52 LVOT Area:     7.55 cm  LV Volumes (MOD) LV vol d, MOD A2C: 64.8 ml LV vol d, MOD A4C: 87.0 ml LV vol s, MOD A2C: 32.4 ml LV vol s, MOD A4C: 58.6 ml LV SV MOD A2C:     32.4 ml LV SV MOD A4C:     87.0 ml LV SV MOD BP:      34.6 ml RIGHT VENTRICLE         IVC TAPSE (M-mode): 1.0 cm  IVC diam: 1.60 cm LEFT ATRIUM             Index        RIGHT ATRIUM           Index LA diam:        5.00 cm 2.84 cm/m   RA Area:     14.00 cm LA Vol (A2C):   43.3 ml 24.63 ml/m  RA Volume:   27.80 ml  15.82 ml/m LA Vol (A4C):   53.2 ml 30.27 ml/m LA Biplane Vol: 52.1 ml 29.64 ml/m  AORTIC VALVE AV Area (Vmax):    2.69 cm AV Area (Vmean):   2.56 cm AV Area (VTI):     2.92 cm AV Vmax:           208.00 cm/s AV Vmean:          134.000 cm/s AV VTI:            0.312 m AV Peak Grad:      17.3 mmHg AV Mean Grad:      8.5 mmHg LVOT Vmax:         74.05 cm/s LVOT Vmean:        45.400 cm/s LVOT VTI:          0.120 m LVOT/AV VTI ratio: 0.39  AORTA Ao Root diam: 4.40 cm Ao Asc  diam:  5.10 cm MITRAL VALVE MV Area (PHT): 1.58 cm     SHUNTS MV Area VTI:   1.74 cm     Systemic VTI:  0.12 m MV Peak grad:  19.2 mmHg    Systemic Diam: 3.10 cm MV Mean grad:  8.0 mmHg MV Vmax:       2.19 m/s MV Vmean:      134.0 cm/s MV Decel Time: 479 msec MV E velocity: 187.00 cm/s Gwyndolyn Kaufman MD Electronically signed by Gwyndolyn Kaufman MD Signature Date/Time: 03/31/2021/5:10:46 PM    Final     Assessment/Plan  1. 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   2. Hypernatremia NA normal but trending up, not using torsemide Prone to hypernatremia Encourage oral fluid between bites Recheck 2/13  3. Prosthetic valve endocarditis, subsequent encounter Vegetation still on mitral valve No symptoms Neg blood cultures On amoxicillin indefinitely  4. Atrial fibrillation, chronic (HCC) Regular on exam On tikosyn  5. Essential hypertension Controlled.   6. 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  The periods of sleep are causing him to lose weight. This is a reflection of his status and not something  that can be fixed. He has a DNR. Not sure about a feeding tube for the future. His wife would like to continue to get him to maintain without the use of a feeding tube.   7. Benign prostatic hyperplasia with urinary obstruction On proscar Off flomax Continue bladder scans at this time.   8. Controlled type 2 diabetes mellitus with complication, without long-term current use of insulin (HCC) Diet controlled.  Losing weight.   9. Mixed hyperlipidemia Lab Results  Component Value Date   LDLCALC 56 08/08/2020    On statin  10. Anemia of chronic disease Lab Results  Component Value Date   HGB 10.2  (A) 03/09/2021  He is pale on exam, will recheck CBC  Family/ staff Communication: discussed with [place]  Labs/tests ordered:   CBC BMP Monday 2/13

## 2021-04-16 DIAGNOSIS — R41841 Cognitive communication deficit: Secondary | ICD-10-CM | POA: Diagnosis not present

## 2021-04-16 DIAGNOSIS — I69391 Dysphagia following cerebral infarction: Secondary | ICD-10-CM | POA: Diagnosis not present

## 2021-04-16 DIAGNOSIS — H538 Other visual disturbances: Secondary | ICD-10-CM | POA: Diagnosis not present

## 2021-04-16 DIAGNOSIS — I5022 Chronic systolic (congestive) heart failure: Secondary | ICD-10-CM | POA: Diagnosis not present

## 2021-04-16 DIAGNOSIS — R41840 Attention and concentration deficit: Secondary | ICD-10-CM | POA: Diagnosis not present

## 2021-04-16 DIAGNOSIS — I69354 Hemiplegia and hemiparesis following cerebral infarction affecting left non-dominant side: Secondary | ICD-10-CM | POA: Diagnosis not present

## 2021-04-16 DIAGNOSIS — R293 Abnormal posture: Secondary | ICD-10-CM | POA: Diagnosis not present

## 2021-04-16 DIAGNOSIS — I5032 Chronic diastolic (congestive) heart failure: Secondary | ICD-10-CM | POA: Diagnosis not present

## 2021-04-16 DIAGNOSIS — I69812 Visuospatial deficit and spatial neglect following other cerebrovascular disease: Secondary | ICD-10-CM | POA: Diagnosis not present

## 2021-04-16 DIAGNOSIS — S065X3S Traumatic subdural hemorrhage with loss of consciousness of 1 hour to 5 hours 59 minutes, sequela: Secondary | ICD-10-CM | POA: Diagnosis not present

## 2021-04-16 DIAGNOSIS — R278 Other lack of coordination: Secondary | ICD-10-CM | POA: Diagnosis not present

## 2021-04-16 DIAGNOSIS — G9341 Metabolic encephalopathy: Secondary | ICD-10-CM | POA: Diagnosis not present

## 2021-04-16 DIAGNOSIS — M6389 Disorders of muscle in diseases classified elsewhere, multiple sites: Secondary | ICD-10-CM | POA: Diagnosis not present

## 2021-04-16 DIAGNOSIS — I6601 Occlusion and stenosis of right middle cerebral artery: Secondary | ICD-10-CM | POA: Diagnosis not present

## 2021-04-19 ENCOUNTER — Non-Acute Institutional Stay (SKILLED_NURSING_FACILITY): Payer: Medicare Other | Admitting: Adult Health

## 2021-04-19 ENCOUNTER — Encounter: Payer: Self-pay | Admitting: Adult Health

## 2021-04-19 DIAGNOSIS — H538 Other visual disturbances: Secondary | ICD-10-CM | POA: Diagnosis not present

## 2021-04-19 DIAGNOSIS — N401 Enlarged prostate with lower urinary tract symptoms: Secondary | ICD-10-CM

## 2021-04-19 DIAGNOSIS — E870 Hyperosmolality and hypernatremia: Secondary | ICD-10-CM | POA: Diagnosis not present

## 2021-04-19 DIAGNOSIS — R41841 Cognitive communication deficit: Secondary | ICD-10-CM | POA: Diagnosis not present

## 2021-04-19 DIAGNOSIS — R338 Other retention of urine: Secondary | ICD-10-CM

## 2021-04-19 DIAGNOSIS — S069X3S Unspecified intracranial injury with loss of consciousness of 1 hour to 5 hours 59 minutes, sequela: Secondary | ICD-10-CM | POA: Diagnosis not present

## 2021-04-19 DIAGNOSIS — M6389 Disorders of muscle in diseases classified elsewhere, multiple sites: Secondary | ICD-10-CM | POA: Diagnosis not present

## 2021-04-19 DIAGNOSIS — I5032 Chronic diastolic (congestive) heart failure: Secondary | ICD-10-CM | POA: Diagnosis not present

## 2021-04-19 DIAGNOSIS — I69354 Hemiplegia and hemiparesis following cerebral infarction affecting left non-dominant side: Secondary | ICD-10-CM | POA: Diagnosis not present

## 2021-04-19 DIAGNOSIS — R293 Abnormal posture: Secondary | ICD-10-CM | POA: Diagnosis not present

## 2021-04-19 DIAGNOSIS — R41840 Attention and concentration deficit: Secondary | ICD-10-CM | POA: Diagnosis not present

## 2021-04-19 DIAGNOSIS — R5383 Other fatigue: Secondary | ICD-10-CM

## 2021-04-19 DIAGNOSIS — S065X3S Traumatic subdural hemorrhage with loss of consciousness of 1 hour to 5 hours 59 minutes, sequela: Secondary | ICD-10-CM | POA: Diagnosis not present

## 2021-04-19 DIAGNOSIS — I69391 Dysphagia following cerebral infarction: Secondary | ICD-10-CM | POA: Diagnosis not present

## 2021-04-19 DIAGNOSIS — R278 Other lack of coordination: Secondary | ICD-10-CM | POA: Diagnosis not present

## 2021-04-19 DIAGNOSIS — I6601 Occlusion and stenosis of right middle cerebral artery: Secondary | ICD-10-CM | POA: Diagnosis not present

## 2021-04-19 DIAGNOSIS — I5022 Chronic systolic (congestive) heart failure: Secondary | ICD-10-CM | POA: Diagnosis not present

## 2021-04-19 DIAGNOSIS — I69812 Visuospatial deficit and spatial neglect following other cerebrovascular disease: Secondary | ICD-10-CM | POA: Diagnosis not present

## 2021-04-19 DIAGNOSIS — G9341 Metabolic encephalopathy: Secondary | ICD-10-CM | POA: Diagnosis not present

## 2021-04-19 LAB — HEPATIC FUNCTION PANEL
ALT: 20 (ref 10–40)
AST: 23 (ref 14–40)
Alkaline Phosphatase: 175 — AB (ref 25–125)
Bilirubin, Total: 0.5

## 2021-04-19 LAB — CBC AND DIFFERENTIAL
HCT: 30 — AB (ref 41–53)
Hemoglobin: 10.3 — AB (ref 13.5–17.5)
Platelets: 282 (ref 150–399)
WBC: 10.3

## 2021-04-19 LAB — BASIC METABOLIC PANEL
BUN: 12 (ref 4–21)
CO2: 27 — AB (ref 13–22)
Chloride: 105 (ref 99–108)
Creatinine: 0.5 — AB (ref 0.6–1.3)
Glucose: 92
Potassium: 3.6 (ref 3.4–5.3)
Sodium: 142 (ref 137–147)

## 2021-04-19 LAB — CBC: RBC: 3.81 — AB (ref 3.87–5.11)

## 2021-04-19 LAB — COMPREHENSIVE METABOLIC PANEL
Albumin: 3.6 (ref 3.5–5.0)
Calcium: 9.2 (ref 8.7–10.7)
Globulin: 2.8

## 2021-04-19 NOTE — Progress Notes (Signed)
Location:  Occupational psychologist of Service:  SNF (31) Provider:   Cindi Carbon, [place] [phone]   [name], MD  Patient Care Team: [name], MD as PCP - General (Internal Medicine) Larey Dresser, MD as PCP - Cardiology (Cardiology) [name], MD as Consulting Physician (Ophthalmology) [name], MD (Internal Medicine)  Extended Emergency Contact Information Primary Emergency Contact: [name] Address: [place], [place] [address] [name] of [place] Phone: [phone] Relation: Spouse Secondary Emergency Contact: [name], [place] [address] [name] of [place] Phone: [phone] Relation: Son  Code Status:  DNR Goals of care: Advanced Directive information Advanced Directives 04/15/2021  Does Patient Have a Medical Advance Directive? Yes  Type of Paramedic of [place];Living will;Out of facility DNR (pink MOST or yellow form)  Does patient want to make changes to medical advance directive? No - Patient declined  Copy of [place] in Chart? Yes - validated most recent copy scanned in chart (See row information)  Would patient like information on creating a medical advance directive? -  Pre-existing out of facility DNR order (yellow form or pink MOST form) -     Chief Complaint  Patient presents with   Acute Visit    Care plan    HPI:  Pt is a 81 y.o. male seen today for an acute visit for a care plan. He currently resides in skilled care after an episode of syncope with fall in May of 2022, TBI with SDH with craniotomy, acute right MCA s/p thrombectomy, mitral and aortic valve replacement, DM II, afib, recent bout of endocarditis, CHF, and dysphagia.   His wife Herbert Pun and son Jeral Fruit are present for the meeting.  Claiborne Billings, the nutritionist discussed that he is taking glucerna. Weight ranging 142-150. His  weight is stable for the past few weeks but has been on a downward trend since Dec. No issues chewing or swallowing. He is not feeding himself but they would like him to try. The nurse will work on this.   Gets checked through the night to make sure he is dry and in his normal state of health but the family feels this is affecting his sleep. They would like to reduce the checks to promote sleep as he is sleeping so much through the day.   His wife is requesting regular lab monitoring due to his hx of hypernatremia.   The nurse is asking if we can reduce his bladder scans qshift as they have not yielded frequent catheterization.   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    Other and unspecified hyperlipidemia    Pre-diabetes    Previous back surgery 1978, jan 2007   Psychosexual dysfunction with  inhibited sexual excitement    SOB (shortness of breath)    "with heavy exercise"   Stroke ([place]) 08/2013   "I WAS IN AFIB AND THREW A CLOT, THE EFFECTS WERE TRANSITORY AND DIDNT LAST BUT FOR 30 MINUTES"    Thoracic aortic aneurysm    Past Surgical History:  Procedure Laterality Date   AORTIC AND MITRAL VALVE REPLACEMENT     09/2004   CARDIOVERSION     3 times from 2004-2006   CARDIOVERSION N/A 09/26/2013   Procedure: CARDIOVERSION;  Surgeon: Larey Dresser, MD;  Location: [facility];  Service: Cardiovascular;  Laterality: N/A;   CARDIOVERSION N/A 06/19/2014   Procedure: CARDIOVERSION;  Surgeon: Jerline Pain, MD;  Location: [facility];  Service: Cardiovascular;  Laterality: N/A;   CARDIOVERSION N/A 04/24/2017   Procedure: CARDIOVERSION;  Surgeon: Larey Dresser, MD;  Location: [facility];  Service: Cardiovascular;  Laterality: N/A;   CARDIOVERSION N/A 06/08/2017   Procedure: CARDIOVERSION;  Surgeon: Pixie Casino, MD;  Location: [facility] ENDOSCOPY;  Service: Cardiovascular;  Laterality: N/A;   CARDIOVERSION N/A 08/24/2017   Procedure: CARDIOVERSION;  Surgeon: Skeet Latch, MD;  Location: [facility] ENDOSCOPY;  Service: Cardiovascular;  Laterality: N/A;   CARDIOVERSION N/A 02/14/2018   Procedure: CARDIOVERSION;  Surgeon: Larey Dresser, MD;  Location[facility] ENDOSCOPY;  Service: Cardiovascular;  Laterality: N/A;   COLONOSCOPY     CRANIOTOMY N/A 07/28/2020   Procedure: CRANIOTOMY FOR EVACUATION OF SUBDURAL HEMATOMA;  Surgeon: Eustace Mathers, MD;  Location: [place];  Service: Neurosurgery;  Laterality: N/A;   IR ANGIO VERTEBRAL SEL SUBCLAVIAN INNOMINATE UNI R MOD SED  08/07/2020   IR CT HEAD LTD  08/07/2020   IR PERCUTANEOUS ART THROMBECTOMY/INFUSION INTRACRANIAL INC DIAG ANGIO  08/07/2020   laminectomies     10/1975 and in 03/2005   RADIOLOGY WITH ANESTHESIA N/A 08/07/2020   Procedure: IR WITH ANESTHESIA;  Surgeon: Luanne Bras, MD;  Location: [facility];  Service: Radiology;  Laterality: N/A;   TEE WITHOUT CARDIOVERSION N/A 12/18/2020   Procedure: TRANSESOPHAGEAL ECHOCARDIOGRAM (TEE);  Surgeon: Larey Dresser, MD;  [facility];  Service: Cardiovascular;  Laterality: N/A;   [place] Right 04/03/2018   Procedure: TOTAL HIP ARTHROPLASTY ANTERIOR APPROACH;  Surgeon: Paralee Cancel, MD;  Location: [facility];  Service: Orthopedics;  Laterality: Right;  70 mins    Allergies  Allergen Reactions   Naproxen Hives   No [facility] Food Allergies Other (See Comments)    Scallops - distress, nausea and vomitting    Outpatient Encounter Medications as of 04/19/2021   Medication Sig   amantadine (SYMMETREL) 100 MG capsule Take 100 mg by mouth 2 (two) times daily.   amLODipine (NORVASC) 10 MG tablet Take 0.5 tablets (5 mg total) by mouth daily.   amoxicillin (AMOXIL) 400 MG/5ML suspension Take 6.3 mLs (500 mg total) by mouth 2 (two) times daily. Please flavor.  Duration to be determined by infectious disease MD during outpatient follow-up.   apixaban (ELIQUIS) 5 MG TABS tablet Take 1 tablet (5 mg total) by mouth 2 (two) times daily.   Artificial Saliva (BIOTENE MOISTURIZING MOUTH MT) Use as directed 2 sprays in the mouth or throat in the morning, at noon, and at bedtime.   atorvastatin (LIPITOR) 80 MG tablet Take 1 tablet (80 mg total) by mouth daily.   bisacodyl (DULCOLAX) 10 MG suppository Place 1 suppository (10 mg total) rectally daily as needed for moderate constipation or mild  constipation.   clobetasol cream (TEMOVATE) 0.99 % Apply 1 application topically daily as needed (for skin irritation).    dofetilide (TIKOSYN) 125 MCG capsule Take 3 capsules (375 mcg total) by mouth 2 (two) times daily.   famotidine (PEPCID) 20 MG tablet Take 20 mg by mouth at bedtime as needed for indigestion.   finasteride (PROSCAR) 5 MG tablet Take 1 tablet (5 mg total) by mouth daily.   hydrocortisone cream 1 % Apply topically 2 (two) times daily as needed for itching (rash).   lacosamide (VIMPAT) 50 MG TABS tablet Take 1 tablet (50 mg total) by mouth 2 (two) times daily.   levalbuterol (XOPENEX) 0.63 MG/3ML nebulizer solution Take 0.63 mg by nebulization every 6 (six) hours as needed for wheezing or shortness of breath.   levalbuterol (XOPENEX) 0.63 MG/3ML nebulizer solution Take 0.63 mg by nebulization as needed for wheezing or shortness of breath.   magnesium gluconate (MAGONATE) 500 MG tablet Take 1 tablet (500 mg total) by mouth daily.   melatonin 5 MG TABS Take 5 mg by mouth at bedtime.   methenamine (HIPREX) 1 g tablet Take 1 g by mouth 2 (two) times daily with a meal.    Multiple Vitamin (MULTIVITAMIN WITH MINERALS) TABS tablet Take 1 tablet by mouth daily.   Polyethyl Glycol-Propyl Glycol (SYSTANE) 0.4-0.3 % SOLN Place 2 drops into both eyes daily.   polyethylene glycol (MIRALAX / GLYCOLAX) 17 g packet Take 17 g by mouth daily.   senna-docusate (SENOKOT-S) 8.6-50 MG tablet Take 1 tablet by mouth at bedtime as needed.   torsemide (DEMADEX) 20 MG tablet Take 20 mg by mouth daily as needed.   No facility-administered encounter medications on file as of 04/19/2021.    Review of Systems  Unable to perform ROS: Patient nonverbal   Immunization History  Administered Date(s) Administered   Fluad Quad(high Dose 65+) 12/06/2018, 12/09/2020   Influenza Whole 12/25/2008, 01/05/2010, 12/30/2011   Influenza, High Dose Seasonal PF 12/14/2016   Influenza,inj,Quad PF,6+ Mos 12/19/2013   Influenza,inj,quad, With Preservative 11/22/2017   Influenza-Unspecified 12/14/2015, 12/24/2019   Moderna Covid-19 Vaccine Bivalent Booster 30yrs & up 01/12/2021   Moderna SARS-COV2 Booster Vaccination 01/02/2020, 06/26/2020   Moderna Sars-Covid-2 Vaccination 04/08/2019, 05/05/2019   Pneumococcal Conjugate-13 12/19/2013   Pneumococcal Polysaccharide-23 09/24/2007, 05/22/2017   Td 12/25/2008   Tdap 03/22/2018   Zoster Recombinat (Shingrix) 07/16/2019, 10/28/2019   Zoster, Live 09/24/2007   Pertinent  Health Maintenance Due  Topic Date Due   FOOT EXAM  Never done   OPHTHALMOLOGY EXAM  Never done   URINE MICROALBUMIN  05/13/2021 (Originally 10/04/1950)   HEMOGLOBIN A1C  06/17/2021   INFLUENZA VACCINE  Completed   COLONOSCOPY (Pts 45-46yrs Insurance coverage will need to be confirmed)  Discontinued   Fall Risk 01/26/2021 01/26/2021 01/26/2021 01/27/2021 02/24/2021  Falls in the past year? - - - - 0  Was there an injury with Fall? - - - - 0  Fall Risk Category Calculator - - - - 0  Fall Risk Category - - - - Low  Patient Fall Risk Level High fall risk High fall risk High fall  risk High fall risk High fall risk  Patient at Risk for Falls Due to - - - - Impaired mobility  Patient at Risk for Falls Due to - - - - -  Fall risk Follow up - - - - Falls evaluation completed   Functional Status Survey:    There were no vitals filed for this visit. There is  no height or weight on file to calculate BMI. Physical Exam Vitals and nursing note reviewed.  Constitutional:      Comments: asleep    Labs reviewed: Recent Labs    12/18/20 1630 12/19/20 0610 12/19/20 1632 12/20/20 0153 01/25/21 1779 01/25/21 0853 01/26/21 0435 01/27/21 0538 02/01/21 0000 02/08/21 0000 02/09/21 0000 03/29/21 0000 04/05/21 0000 04/12/21 0000  NA  --  139  --    < > 143  --  143 140   < > 150*   < > 139 143   143 144  K  --  3.5  --    < > 3.6  --  3.8 3.5   < > 3.7   < > 3.7 3.9   3.9 3.5  CL  --  108  --    < > 107  --  104 107   < > 107   < > 103 106 108  CO2  --  23  --    < > 27  --  26 25   < > 22   < > 24* 26* 26*  GLUCOSE  --  186*  --    < > 93  --  105* 137*  --   --   --   --   --   --   BUN  --  16  --    < > 11  --  12 15   < > 23*   < > 14 17 19   CREATININE  --  0.76  --    < > 0.94  --  0.95 1.06   < > 0.9   < > 0.6 0.6 0.6  CALCIUM  --  7.7*  --    < > 8.2*  --  8.7* 8.7*   < > 9.9   < > 9.3 9.3 9.3  MG 1.9 2.0 1.9   < >  --    < > 2.2 2.4  --  2.1  --   --   --   --   PHOS 3.1 2.7 2.4*  --   --   --   --   --   --   --   --   --   --   --    < > = values in this interval not displayed.   Recent Labs    01/03/21 0221 01/08/21 0000 01/20/21 1631 01/23/21 1355 02/08/21 0000 02/22/21 0000 02/23/21 0000 03/09/21 0000  AST 32   < > 27 37   < > 33 32 23  ALT 63*   < > 35 34   < > 36 35 23  ALKPHOS 94   < > 115 107   < > 226* 244* 230*  BILITOT 0.4  --  0.6 0.7  --   --   --   --   PROT 6.3*  --  6.9 6.9  --   --   --   --   ALBUMIN 1.9*   < > 2.5* 2.4*   < > 3.3* 3.5 3.6   < > = values in this interval not displayed.   Recent Labs    08/16/20 0427  08/17/20 0340 12/14/20 1245 12/14/20 1504 12/16/20 0130 12/18/20 0221 01/24/21 0154 01/25/21 0623 01/26/21 0435 02/01/21 0000 02/05/21 1111 02/08/21 0000 02/22/21 0000 03/04/21 0000 03/09/21 0000  WBC 7.9   < > 12.0*   < > 10.7*   < > 10.0  --  10.4   < > 9.8   < > 9.6 10.1 9.7  NEUTROABS 5.7  --  9.3*  --  7.7  --   --   --   --   --   --   --   --   --   --   HGB 9.0*   < > 11.7*   < > 9.9*   < > 7.3*   < > 7.9*   < > 9.8*   < > 9.5* 10.6* 10.2*  HCT 28.2*   < > 36.8*   < > 30.7*   < > 23.3*   < > 25.9*   < > 31.8*   < > 31* 34* 32*  MCV 98.6   < > 90.0   < > 88.0   < > 88.3  --  88.7  --  85.0  --   --   --   --   PLT 177   < > 119*   < > 106*   < > 395  --  417*   < > 462*   < > 272 313 285   < > = values in this interval not displayed.   Lab Results  Component Value Date   TSH 1.773 01/24/2021   Lab Results  Component Value Date   HGBA1C 7.3 (H) 12/17/2020   Lab Results  Component Value Date   CHOL 119 08/08/2020   HDL 42 08/08/2020   LDLCALC 56 08/08/2020   TRIG 105 08/08/2020   CHOLHDL 2.8 08/08/2020    Significant Diagnostic Results in last 30 days:  ECHOCARDIOGRAM COMPLETE  Result Date: 03/31/2021    ECHOCARDIOGRAM REPORT   Patient Name:   Patryk LUDWIN FLAHIVE Date of Exam: 03/31/2021 Medical Rec #:  [id number]     Height:       67.0 in Accession #:    [id number]    Weight:       143.8 lb Date of Birth:  May 28, 1940     BSA:          1.758 m Patient Age:    68 years      BP:           113/69 mmHg Patient Gender: M             HR:           86 bpm. Exam Location:  Outpatient Procedure: 2D Echo, Cardiac Doppler and Color Doppler Indications:    Endocarditis.  History:        Patient has prior history of Echocardiogram examinations, most                 recent 12/18/2020. Abnormal ECG, Stroke, Mitral Valve Disease                 and Aortic Valve Disease, Arrythmias:Atrial Fibrillation and                 Atrial Flutter, Signs/Symptoms:Bacteremia; Risk                  Factors:Hypertension and Diabetes. Rheumatic fever. Mitral and                 aortic valve replacement.                 Aortic Valve: 27 mm Carpentier valve is present in the aortic                 position.  Procedure Date: 10/18/04.                 Mitral Valve: unknown size prosthetic valve is present in the                 mitral position.  Sonographer:    Roseanna Rainbow RDCS Referring Phys: [place] Comments: Technically difficult study due to poor echo windows. Extremely difficult study due to patient being in chair. Patient had left arm strapped to chair. Extremely painful scanning position for sonographer. IMPRESSIONS  1. Technically difficult study due to poor echo windows.  2. Left ventricular ejection fraction, by estimation, is 45 to 50%. The left ventricle has mildly reduced function. Difficult to assess wall motion due to poor acoustic windows. Based on limited views, the basal septal segments appear hypokineitc. There  is mild-to-moderate concentric left ventricular hypertrophy. Diastolic function indeterminant due to MVR.  3. Right ventricular systolic function is mildly reduced. The right ventricular size is normal.  4. The mitral valve has been repaired/replaced. There is a bioprosthetic mitral valve present. Mean gradient 38mmHg at HR 85bpm. LVOT VTI suboptimal however suspect there is a degree of bioprosthetic stenosis. There is mild mitral valve regurgitation.  5. The aortic valve has been repaired/replaced. Aortic valve regurgitation is trivial. There is a 27 mm Carpentier valve present in the aortic position. Procedure Date: 10/18/04. Mean gradient 66mmHg, Vmax 2.2 m/s. LVOT VTI suboptimal to calculate DI/AVA.  6. Aortic dilatation noted. 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  7. The inferior vena cava is normal in size with greater than 50% respiratory variability, suggesting right atrial pressure of 3 mmHg. Comparison(s): Compared to prior TTE in  12/2020, the EF appears less robust at 45-50% compared to 65% on prior study. MV gradient remains elevated at 69mmHg (previously 50mmHg at HR 101). The aortic valve gradient is less on current study at 3mmHg (previously 52mmHg) but in the setting of reduced LV systolic function. FINDINGS  Left Ventricle: The basal septal segments appear hypokinetic. Left ventricular ejection fraction, by estimation, is 45 to 50%. The left ventricle has mildly decreased function. The left ventricle demonstrates regional wall motion abnormalities. The left  ventricular internal cavity size was normal in size. There is mild concentric left ventricular hypertrophy. Abnormal (paradoxical) septal motion consistent with post-operative status. Diastolic function indeterminant due to MVR. Right Ventricle: The right ventricular size is normal. No increase in right ventricular wall thickness. Right ventricular systolic function is mildly reduced. Left Atrium: Left atrial size was not well visualized. Right Atrium: Right atrial size was normal in size. Pericardium: There is no evidence of pericardial effusion. Mitral Valve: The mitral valve has been repaired/replaced. Mild mitral valve regurgitation. There is a unknown size prosthetic present in the mitral position. Mild mitral valve stenosis. MV peak gradient, 19.2 mmHg. The mean mitral valve gradient is 8.0 mmHg. Tricuspid Valve: The tricuspid valve is normal in structure. Tricuspid valve regurgitation is trivial. Aortic Valve: The aortic valve has been repaired/replaced. Aortic valve regurgitation is trivial. Aortic valve mean gradient measures 8.5 mmHg. Aortic valve peak gradient measures 17.3 mmHg. Aortic valve area, by VTI measures 2.92 cm. There is a 27 mm Carpentier valve present in the aortic position. Procedure Date: 10/18/04. Pulmonic Valve: The pulmonic valve was normal in structure. Pulmonic valve regurgitation is trivial. Aorta: Aortic dilatation noted. There is severe dilatation  of the ascending aorta, measuring 51 mm. Venous: The inferior vena cava is  normal in size with greater than 50% respiratory variability, suggesting right atrial pressure of 3 mmHg. IAS/Shunts: The atrial septum is grossly normal.  LEFT VENTRICLE PLAX 2D LVIDd:         4.50 cm     Diastology LVIDs:         3.20 cm     LV e' medial:    9.57 cm/s LV PW:         1.30 cm     LV E/e' medial:  19.5 LV IVS:        1.20 cm     LV e' lateral:   10.60 cm/s LVOT diam:     3.10 cm     LV E/e' lateral: 17.6 LV SV:         91 LV SV Index:   52 LVOT Area:     7.55 cm  LV Volumes (MOD) LV vol d, MOD A2C: 64.8 ml LV vol d, MOD A4C: 87.0 ml LV vol s, MOD A2C: 32.4 ml LV vol s, MOD A4C: 58.6 ml LV SV MOD A2C:     32.4 ml LV SV MOD A4C:     87.0 ml LV SV MOD BP:      34.6 ml RIGHT VENTRICLE         IVC TAPSE (M-mode): 1.0 cm  IVC diam: 1.60 cm LEFT ATRIUM             Index        RIGHT ATRIUM           Index LA diam:        5.00 cm 2.84 cm/m   RA Area:     14.00 cm LA Vol (A2C):   43.3 ml 24.63 ml/m  RA Volume:   27.80 ml  15.82 ml/m LA Vol (A4C):   53.2 ml 30.27 ml/m LA Biplane Vol: 52.1 ml 29.64 ml/m  AORTIC VALVE AV Area (Vmax):    2.69 cm AV Area (Vmean):   2.56 cm AV Area (VTI):     2.92 cm AV Vmax:           208.00 cm/s AV Vmean:          134.000 cm/s AV VTI:            0.312 m AV Peak Grad:      17.3 mmHg AV Mean Grad:      8.5 mmHg LVOT Vmax:         74.05 cm/s LVOT Vmean:        45.400 cm/s LVOT VTI:          0.120 m LVOT/AV VTI ratio: 0.39  AORTA Ao Root diam: 4.40 cm Ao Asc diam:  5.10 cm MITRAL VALVE MV Area (PHT): 1.58 cm     SHUNTS MV Area VTI:   1.74 cm     Systemic VTI:  0.12 m MV Peak grad:  19.2 mmHg    Systemic Diam: 3.10 cm MV Mean grad:  8.0 mmHg MV Vmax:       2.19 m/s MV Vmean:      134.0 cm/s MV Decel Time: 479 msec MV E velocity: 187.00 cm/s Gwyndolyn Kaufman MD Electronically signed by Gwyndolyn Kaufman MD Signature Date/Time: 03/31/2021/5:10:46 PM    Final     Assessment/Plan  1.  Hypernatremia Agreed to BMP weekly for now with the hope to taper this down over time.  Staff will continue to encourage oral fluids.   2. 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 I discussed that we need to consider his wishes for continued intervention with life sustaining measures such as IVF, hospitalization, etc. A most form was provided. He is a DNR. They want to continue to think about this issue on a case by case basis for now.   3. Lethargy Not better with amantadine. Staff to try less night time interruption to see if this helps with his sleep pattern.   4. Urinary retention due to benign prostatic hyperplasia Will reduce PVR bladder scans to daily as he has not needed regularl I/O catheterization   Family/ staff Communication: discussed with Herbert Pun is wife and his son Jeral Fruit   Labs/tests ordered:  BMP weekly

## 2021-04-20 DIAGNOSIS — I69391 Dysphagia following cerebral infarction: Secondary | ICD-10-CM | POA: Diagnosis not present

## 2021-04-20 DIAGNOSIS — R278 Other lack of coordination: Secondary | ICD-10-CM | POA: Diagnosis not present

## 2021-04-20 DIAGNOSIS — R41841 Cognitive communication deficit: Secondary | ICD-10-CM | POA: Diagnosis not present

## 2021-04-20 DIAGNOSIS — I69354 Hemiplegia and hemiparesis following cerebral infarction affecting left non-dominant side: Secondary | ICD-10-CM | POA: Diagnosis not present

## 2021-04-20 DIAGNOSIS — I5022 Chronic systolic (congestive) heart failure: Secondary | ICD-10-CM | POA: Diagnosis not present

## 2021-04-20 DIAGNOSIS — I5032 Chronic diastolic (congestive) heart failure: Secondary | ICD-10-CM | POA: Diagnosis not present

## 2021-04-20 DIAGNOSIS — H538 Other visual disturbances: Secondary | ICD-10-CM | POA: Diagnosis not present

## 2021-04-20 DIAGNOSIS — M6389 Disorders of muscle in diseases classified elsewhere, multiple sites: Secondary | ICD-10-CM | POA: Diagnosis not present

## 2021-04-20 DIAGNOSIS — I69812 Visuospatial deficit and spatial neglect following other cerebrovascular disease: Secondary | ICD-10-CM | POA: Diagnosis not present

## 2021-04-20 DIAGNOSIS — I6601 Occlusion and stenosis of right middle cerebral artery: Secondary | ICD-10-CM | POA: Diagnosis not present

## 2021-04-20 DIAGNOSIS — S065X3S Traumatic subdural hemorrhage with loss of consciousness of 1 hour to 5 hours 59 minutes, sequela: Secondary | ICD-10-CM | POA: Diagnosis not present

## 2021-04-20 DIAGNOSIS — R293 Abnormal posture: Secondary | ICD-10-CM | POA: Diagnosis not present

## 2021-04-20 DIAGNOSIS — R41840 Attention and concentration deficit: Secondary | ICD-10-CM | POA: Diagnosis not present

## 2021-04-20 DIAGNOSIS — G9341 Metabolic encephalopathy: Secondary | ICD-10-CM | POA: Diagnosis not present

## 2021-04-21 DIAGNOSIS — S065X3S Traumatic subdural hemorrhage with loss of consciousness of 1 hour to 5 hours 59 minutes, sequela: Secondary | ICD-10-CM | POA: Diagnosis not present

## 2021-04-21 DIAGNOSIS — I69812 Visuospatial deficit and spatial neglect following other cerebrovascular disease: Secondary | ICD-10-CM | POA: Diagnosis not present

## 2021-04-21 DIAGNOSIS — R41840 Attention and concentration deficit: Secondary | ICD-10-CM | POA: Diagnosis not present

## 2021-04-21 DIAGNOSIS — H538 Other visual disturbances: Secondary | ICD-10-CM | POA: Diagnosis not present

## 2021-04-21 DIAGNOSIS — I69354 Hemiplegia and hemiparesis following cerebral infarction affecting left non-dominant side: Secondary | ICD-10-CM | POA: Diagnosis not present

## 2021-04-21 DIAGNOSIS — I5022 Chronic systolic (congestive) heart failure: Secondary | ICD-10-CM | POA: Diagnosis not present

## 2021-04-21 DIAGNOSIS — I6601 Occlusion and stenosis of right middle cerebral artery: Secondary | ICD-10-CM | POA: Diagnosis not present

## 2021-04-21 DIAGNOSIS — G9341 Metabolic encephalopathy: Secondary | ICD-10-CM | POA: Diagnosis not present

## 2021-04-21 DIAGNOSIS — R278 Other lack of coordination: Secondary | ICD-10-CM | POA: Diagnosis not present

## 2021-04-21 DIAGNOSIS — I5032 Chronic diastolic (congestive) heart failure: Secondary | ICD-10-CM | POA: Diagnosis not present

## 2021-04-21 DIAGNOSIS — R293 Abnormal posture: Secondary | ICD-10-CM | POA: Diagnosis not present

## 2021-04-21 DIAGNOSIS — I69391 Dysphagia following cerebral infarction: Secondary | ICD-10-CM | POA: Diagnosis not present

## 2021-04-21 DIAGNOSIS — R41841 Cognitive communication deficit: Secondary | ICD-10-CM | POA: Diagnosis not present

## 2021-04-21 DIAGNOSIS — M6389 Disorders of muscle in diseases classified elsewhere, multiple sites: Secondary | ICD-10-CM | POA: Diagnosis not present

## 2021-04-22 DIAGNOSIS — R41841 Cognitive communication deficit: Secondary | ICD-10-CM | POA: Diagnosis not present

## 2021-04-22 DIAGNOSIS — S065X3S Traumatic subdural hemorrhage with loss of consciousness of 1 hour to 5 hours 59 minutes, sequela: Secondary | ICD-10-CM | POA: Diagnosis not present

## 2021-04-22 DIAGNOSIS — I5022 Chronic systolic (congestive) heart failure: Secondary | ICD-10-CM | POA: Diagnosis not present

## 2021-04-22 DIAGNOSIS — I5032 Chronic diastolic (congestive) heart failure: Secondary | ICD-10-CM | POA: Diagnosis not present

## 2021-04-22 DIAGNOSIS — R278 Other lack of coordination: Secondary | ICD-10-CM | POA: Diagnosis not present

## 2021-04-22 DIAGNOSIS — G9341 Metabolic encephalopathy: Secondary | ICD-10-CM | POA: Diagnosis not present

## 2021-04-22 DIAGNOSIS — H538 Other visual disturbances: Secondary | ICD-10-CM | POA: Diagnosis not present

## 2021-04-22 DIAGNOSIS — I69812 Visuospatial deficit and spatial neglect following other cerebrovascular disease: Secondary | ICD-10-CM | POA: Diagnosis not present

## 2021-04-22 DIAGNOSIS — I6601 Occlusion and stenosis of right middle cerebral artery: Secondary | ICD-10-CM | POA: Diagnosis not present

## 2021-04-22 DIAGNOSIS — I69354 Hemiplegia and hemiparesis following cerebral infarction affecting left non-dominant side: Secondary | ICD-10-CM | POA: Diagnosis not present

## 2021-04-22 DIAGNOSIS — I38 Endocarditis, valve unspecified: Secondary | ICD-10-CM | POA: Diagnosis not present

## 2021-04-22 DIAGNOSIS — I69391 Dysphagia following cerebral infarction: Secondary | ICD-10-CM | POA: Diagnosis not present

## 2021-04-22 DIAGNOSIS — R293 Abnormal posture: Secondary | ICD-10-CM | POA: Diagnosis not present

## 2021-04-22 DIAGNOSIS — M6389 Disorders of muscle in diseases classified elsewhere, multiple sites: Secondary | ICD-10-CM | POA: Diagnosis not present

## 2021-04-22 DIAGNOSIS — R41840 Attention and concentration deficit: Secondary | ICD-10-CM | POA: Diagnosis not present

## 2021-04-22 LAB — BASIC METABOLIC PANEL
BUN: 14 (ref 4–21)
CO2: 27 — AB (ref 13–22)
Chloride: 105 (ref 99–108)
Creatinine: 0.5 — AB (ref 0.6–1.3)
Glucose: 99
Potassium: 3.5 (ref 3.4–5.3)
Sodium: 140 (ref 137–147)

## 2021-04-22 LAB — COMPREHENSIVE METABOLIC PANEL: Calcium: 9.4 (ref 8.7–10.7)

## 2021-04-23 DIAGNOSIS — R41841 Cognitive communication deficit: Secondary | ICD-10-CM | POA: Diagnosis not present

## 2021-04-23 DIAGNOSIS — I6601 Occlusion and stenosis of right middle cerebral artery: Secondary | ICD-10-CM | POA: Diagnosis not present

## 2021-04-23 DIAGNOSIS — I69812 Visuospatial deficit and spatial neglect following other cerebrovascular disease: Secondary | ICD-10-CM | POA: Diagnosis not present

## 2021-04-23 DIAGNOSIS — I5022 Chronic systolic (congestive) heart failure: Secondary | ICD-10-CM | POA: Diagnosis not present

## 2021-04-23 DIAGNOSIS — G9341 Metabolic encephalopathy: Secondary | ICD-10-CM | POA: Diagnosis not present

## 2021-04-23 DIAGNOSIS — I69354 Hemiplegia and hemiparesis following cerebral infarction affecting left non-dominant side: Secondary | ICD-10-CM | POA: Diagnosis not present

## 2021-04-23 DIAGNOSIS — R293 Abnormal posture: Secondary | ICD-10-CM | POA: Diagnosis not present

## 2021-04-23 DIAGNOSIS — S065X3S Traumatic subdural hemorrhage with loss of consciousness of 1 hour to 5 hours 59 minutes, sequela: Secondary | ICD-10-CM | POA: Diagnosis not present

## 2021-04-23 DIAGNOSIS — M6389 Disorders of muscle in diseases classified elsewhere, multiple sites: Secondary | ICD-10-CM | POA: Diagnosis not present

## 2021-04-23 DIAGNOSIS — I5032 Chronic diastolic (congestive) heart failure: Secondary | ICD-10-CM | POA: Diagnosis not present

## 2021-04-23 DIAGNOSIS — I69391 Dysphagia following cerebral infarction: Secondary | ICD-10-CM | POA: Diagnosis not present

## 2021-04-23 DIAGNOSIS — R41840 Attention and concentration deficit: Secondary | ICD-10-CM | POA: Diagnosis not present

## 2021-04-23 DIAGNOSIS — H538 Other visual disturbances: Secondary | ICD-10-CM | POA: Diagnosis not present

## 2021-04-23 DIAGNOSIS — R278 Other lack of coordination: Secondary | ICD-10-CM | POA: Diagnosis not present

## 2021-04-26 DIAGNOSIS — I5022 Chronic systolic (congestive) heart failure: Secondary | ICD-10-CM | POA: Diagnosis not present

## 2021-04-26 DIAGNOSIS — G9341 Metabolic encephalopathy: Secondary | ICD-10-CM | POA: Diagnosis not present

## 2021-04-26 DIAGNOSIS — H538 Other visual disturbances: Secondary | ICD-10-CM | POA: Diagnosis not present

## 2021-04-26 DIAGNOSIS — R278 Other lack of coordination: Secondary | ICD-10-CM | POA: Diagnosis not present

## 2021-04-26 DIAGNOSIS — I5032 Chronic diastolic (congestive) heart failure: Secondary | ICD-10-CM | POA: Diagnosis not present

## 2021-04-26 DIAGNOSIS — R293 Abnormal posture: Secondary | ICD-10-CM | POA: Diagnosis not present

## 2021-04-26 DIAGNOSIS — R41841 Cognitive communication deficit: Secondary | ICD-10-CM | POA: Diagnosis not present

## 2021-04-26 DIAGNOSIS — S065X3S Traumatic subdural hemorrhage with loss of consciousness of 1 hour to 5 hours 59 minutes, sequela: Secondary | ICD-10-CM | POA: Diagnosis not present

## 2021-04-26 DIAGNOSIS — I69812 Visuospatial deficit and spatial neglect following other cerebrovascular disease: Secondary | ICD-10-CM | POA: Diagnosis not present

## 2021-04-26 DIAGNOSIS — I6601 Occlusion and stenosis of right middle cerebral artery: Secondary | ICD-10-CM | POA: Diagnosis not present

## 2021-04-26 DIAGNOSIS — R41840 Attention and concentration deficit: Secondary | ICD-10-CM | POA: Diagnosis not present

## 2021-04-26 DIAGNOSIS — I69354 Hemiplegia and hemiparesis following cerebral infarction affecting left non-dominant side: Secondary | ICD-10-CM | POA: Diagnosis not present

## 2021-04-26 DIAGNOSIS — I69391 Dysphagia following cerebral infarction: Secondary | ICD-10-CM | POA: Diagnosis not present

## 2021-04-26 DIAGNOSIS — M6389 Disorders of muscle in diseases classified elsewhere, multiple sites: Secondary | ICD-10-CM | POA: Diagnosis not present

## 2021-04-26 DIAGNOSIS — E871 Hypo-osmolality and hyponatremia: Secondary | ICD-10-CM | POA: Diagnosis not present

## 2021-04-26 LAB — BASIC METABOLIC PANEL
BUN: 16 (ref 4–21)
CO2: 26 — AB (ref 13–22)
Chloride: 108 (ref 99–108)
Creatinine: 0.6 (ref 0.6–1.3)
Glucose: 94
Potassium: 3.7 (ref 3.4–5.3)
Sodium: 145 (ref 137–147)

## 2021-04-26 LAB — COMPREHENSIVE METABOLIC PANEL: Calcium: 9.2 (ref 8.7–10.7)

## 2021-04-27 ENCOUNTER — Ambulatory Visit: Payer: Medicare Other | Admitting: Internal Medicine

## 2021-04-27 DIAGNOSIS — I5032 Chronic diastolic (congestive) heart failure: Secondary | ICD-10-CM | POA: Diagnosis not present

## 2021-04-27 DIAGNOSIS — I69391 Dysphagia following cerebral infarction: Secondary | ICD-10-CM | POA: Diagnosis not present

## 2021-04-27 DIAGNOSIS — I69354 Hemiplegia and hemiparesis following cerebral infarction affecting left non-dominant side: Secondary | ICD-10-CM | POA: Diagnosis not present

## 2021-04-27 DIAGNOSIS — I6601 Occlusion and stenosis of right middle cerebral artery: Secondary | ICD-10-CM | POA: Diagnosis not present

## 2021-04-27 DIAGNOSIS — R41841 Cognitive communication deficit: Secondary | ICD-10-CM | POA: Diagnosis not present

## 2021-04-28 DIAGNOSIS — R41841 Cognitive communication deficit: Secondary | ICD-10-CM | POA: Diagnosis not present

## 2021-04-28 DIAGNOSIS — S065X3S Traumatic subdural hemorrhage with loss of consciousness of 1 hour to 5 hours 59 minutes, sequela: Secondary | ICD-10-CM | POA: Diagnosis not present

## 2021-04-28 DIAGNOSIS — H538 Other visual disturbances: Secondary | ICD-10-CM | POA: Diagnosis not present

## 2021-04-28 DIAGNOSIS — R41840 Attention and concentration deficit: Secondary | ICD-10-CM | POA: Diagnosis not present

## 2021-04-28 DIAGNOSIS — M6389 Disorders of muscle in diseases classified elsewhere, multiple sites: Secondary | ICD-10-CM | POA: Diagnosis not present

## 2021-04-28 DIAGNOSIS — I69391 Dysphagia following cerebral infarction: Secondary | ICD-10-CM | POA: Diagnosis not present

## 2021-04-28 DIAGNOSIS — I69354 Hemiplegia and hemiparesis following cerebral infarction affecting left non-dominant side: Secondary | ICD-10-CM | POA: Diagnosis not present

## 2021-04-28 DIAGNOSIS — I6601 Occlusion and stenosis of right middle cerebral artery: Secondary | ICD-10-CM | POA: Diagnosis not present

## 2021-04-28 DIAGNOSIS — R293 Abnormal posture: Secondary | ICD-10-CM | POA: Diagnosis not present

## 2021-04-28 DIAGNOSIS — R278 Other lack of coordination: Secondary | ICD-10-CM | POA: Diagnosis not present

## 2021-04-28 DIAGNOSIS — I5022 Chronic systolic (congestive) heart failure: Secondary | ICD-10-CM | POA: Diagnosis not present

## 2021-04-28 DIAGNOSIS — I69812 Visuospatial deficit and spatial neglect following other cerebrovascular disease: Secondary | ICD-10-CM | POA: Diagnosis not present

## 2021-04-28 DIAGNOSIS — G9341 Metabolic encephalopathy: Secondary | ICD-10-CM | POA: Diagnosis not present

## 2021-04-28 DIAGNOSIS — I5032 Chronic diastolic (congestive) heart failure: Secondary | ICD-10-CM | POA: Diagnosis not present

## 2021-04-29 ENCOUNTER — Non-Acute Institutional Stay (SKILLED_NURSING_FACILITY): Payer: Medicare Other | Admitting: Adult Health

## 2021-04-29 ENCOUNTER — Encounter: Payer: Self-pay | Admitting: Adult Health

## 2021-04-29 DIAGNOSIS — I5032 Chronic diastolic (congestive) heart failure: Secondary | ICD-10-CM | POA: Diagnosis not present

## 2021-04-29 DIAGNOSIS — R278 Other lack of coordination: Secondary | ICD-10-CM | POA: Diagnosis not present

## 2021-04-29 DIAGNOSIS — R293 Abnormal posture: Secondary | ICD-10-CM | POA: Diagnosis not present

## 2021-04-29 DIAGNOSIS — R5383 Other fatigue: Secondary | ICD-10-CM

## 2021-04-29 DIAGNOSIS — I69391 Dysphagia following cerebral infarction: Secondary | ICD-10-CM | POA: Diagnosis not present

## 2021-04-29 DIAGNOSIS — I5022 Chronic systolic (congestive) heart failure: Secondary | ICD-10-CM | POA: Diagnosis not present

## 2021-04-29 DIAGNOSIS — M6389 Disorders of muscle in diseases classified elsewhere, multiple sites: Secondary | ICD-10-CM | POA: Diagnosis not present

## 2021-04-29 DIAGNOSIS — S065X3S Traumatic subdural hemorrhage with loss of consciousness of 1 hour to 5 hours 59 minutes, sequela: Secondary | ICD-10-CM | POA: Diagnosis not present

## 2021-04-29 DIAGNOSIS — R9431 Abnormal electrocardiogram [ECG] [EKG]: Secondary | ICD-10-CM | POA: Diagnosis not present

## 2021-04-29 DIAGNOSIS — R41840 Attention and concentration deficit: Secondary | ICD-10-CM | POA: Diagnosis not present

## 2021-04-29 DIAGNOSIS — R41841 Cognitive communication deficit: Secondary | ICD-10-CM | POA: Diagnosis not present

## 2021-04-29 DIAGNOSIS — H538 Other visual disturbances: Secondary | ICD-10-CM | POA: Diagnosis not present

## 2021-04-29 DIAGNOSIS — F4321 Adjustment disorder with depressed mood: Secondary | ICD-10-CM

## 2021-04-29 DIAGNOSIS — I69812 Visuospatial deficit and spatial neglect following other cerebrovascular disease: Secondary | ICD-10-CM | POA: Diagnosis not present

## 2021-04-29 DIAGNOSIS — G9341 Metabolic encephalopathy: Secondary | ICD-10-CM | POA: Diagnosis not present

## 2021-04-29 DIAGNOSIS — I69354 Hemiplegia and hemiparesis following cerebral infarction affecting left non-dominant side: Secondary | ICD-10-CM | POA: Diagnosis not present

## 2021-04-29 DIAGNOSIS — I6601 Occlusion and stenosis of right middle cerebral artery: Secondary | ICD-10-CM | POA: Diagnosis not present

## 2021-04-29 NOTE — Progress Notes (Signed)
Location:  [facility] Room Number: 536-R Place of Service:  SNF [phone]) Provider:  Royal Hawthorn, NP   Patient Care Team: [name], MD as PCP - General (Internal Medicine) Larey Dresser, MD as PCP - Cardiology (Cardiology) [name], MD as Consulting Physician (Ophthalmology) [name], MD (Internal Medicine)  Extended Emergency Contact Information Primary Emergency Contact: [name] Address: [place], [place] [address] [name] of [place] Phone: [phone] Relation: Spouse Secondary Emergency Contact: [name], [place] [address] [name] of [place] Phone: [phone] Relation: Son  Code Status:  DNR Goals of care: Advanced Directive information Advanced Directives 04/29/2021  Does Patient Have a Medical Advance Directive? Yes  Type of Paramedic of [place];Living will;Out of facility DNR (pink MOST or yellow form)  Does patient want to make changes to medical advance directive? No - Patient declined  Copy of [place] in Chart? Yes - validated most recent copy scanned in chart (See row information)  Would patient like information on creating a medical advance directive? -  Pre-existing out of facility DNR order (yellow form or pink MOST form) -     Chief Complaint  Patient presents with   Acute Visit    Depression     HPI:  Pt is a 81 y.o. male seen today for an acute visit for depression. His wife asked me to evaluate him for this issue. He sleeps most of the time and appears lethargic, lacks motivation. Weights are stable but over the past year his weight has trended downward due to low intake. 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 Due to his CVA and TBI his speech is minimal. He can intermittent follow  commands. He has not endorsed depression bu this wife asked that I come when she is not there to see if he would open up.  Wt Readings from Last 3 Encounters:  04/29/21 143 lb 1.6 oz (64.9 kg)  04/15/21 143 lb 6.4 oz (65 kg)  03/22/21 143 lb 12.8 oz (65.2 kg)   He did track me with his eyes and was able to squeeze my right hand on command and wiggle his left fingers as he has left sided weakness due to his CVA.  He could not answer questions for a PHQ 9 score. I did try having him stick one finger up for a yes and two for a no but it was not helpful.  His wife is looking for something to help with his mood and/lethargy. He was started on zoloft in the past but then he went to the hospital for endocarditis it was not continued. 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   Lesion of ulnar nerve    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    Other and unspecified hyperlipidemia    Pre-diabetes    Previous back surgery 1978, jan 2007   Psychosexual dysfunction with inhibited sexual excitement    SOB (shortness of breath)    "with heavy exercise"   Stroke ([place]) 08/2013   "I WAS IN AFIB AND THREW A CLOT, THE EFFECTS WERE TRANSITORY AND DIDNT LAST BUT FOR 30 MINUTES"    Thoracic aortic aneurysm    Past Surgical History:  Procedure Laterality Date   AORTIC AND MITRAL VALVE REPLACEMENT     09/2004   CARDIOVERSION     3 times  from 2004-2006   CARDIOVERSION N/A 09/26/2013   Procedure: CARDIOVERSION;  Surgeon: Larey Dresser, MD;  Location: [facility];  Service: Cardiovascular;  Laterality: N/A;   CARDIOVERSION N/A 06/19/2014   Procedure: CARDIOVERSION;  Surgeon: Jerline Pain, MD;  Location: [facility];  Service: Cardiovascular;  Laterality: N/A;   CARDIOVERSION N/A 04/24/2017   Procedure: CARDIOVERSION;  Surgeon: Larey Dresser, MD;  Location: [facility];  Service: Cardiovascular;  Laterality: N/A;   CARDIOVERSION N/A 06/08/2017   Procedure: CARDIOVERSION;  Surgeon: Pixie Casino, MD;  Location: [facility] ENDOSCOPY;  Service: Cardiovascular;  Laterality: N/A;   CARDIOVERSION N/A 08/24/2017   Procedure: CARDIOVERSION;  Surgeon: Skeet Latch, MD;  Location: [facility] ENDOSCOPY;  Service: Cardiovascular;  Laterality: N/A;   CARDIOVERSION N/A 02/14/2018   Procedure: CARDIOVERSION;  Surgeon: Larey Dresser, MD;  Location: [facility] ENDOSCOPY;  Service: Cardiovascular;  Laterality: N/A;   COLONOSCOPY     CRANIOTOMY N/A 07/28/2020   Procedure: CRANIOTOMY FOR EVACUATION OF SUBDURAL HEMATOMA;  Surgeon: Eustace Barefoot, MD;  Location: [facility];  Service: Neurosurgery;  Laterality: N/A;   IR ANGIO VERTEBRAL SEL SUBCLAVIAN INNOMINATE UNI R MOD SED  08/07/2020   IR CT HEAD LTD  08/07/2020   IR PERCUTANEOUS ART THROMBECTOMY/INFUSION INTRACRANIAL INC DIAG ANGIO  08/07/2020   laminectomies     10/1975 and in 03/2005   RADIOLOGY WITH ANESTHESIA N/A 08/07/2020   Procedure: IR WITH ANESTHESIA;  Surgeon: Luanne Bras, MD;  Location: [facility];  Service: Radiology;  Laterality: N/A;   TEE WITHOUT CARDIOVERSION N/A 12/18/2020   Procedure: TRANSESOPHAGEAL ECHOCARDIOGRAM (TEE);  Surgeon: Larey Dresser, MD;  Location: [facility] ENDOSCOPY;  Service: Cardiovascular;  Laterality: N/A;   [place] Right 04/03/2018   Procedure: TOTAL HIP ARTHROPLASTY ANTERIOR APPROACH;  Surgeon: Paralee Cancel, MD;  Location: [facility];  Service:  Orthopedics;  Laterality: Right;  70 mins    Allergies  Allergen Reactions   Naproxen Hives   No [facility] Food Allergies Other (See Comments)    Scallops - distress, nausea and vomitting    Outpatient Encounter Medications as of 04/29/2021  Medication Sig   amantadine (SYMMETREL) 100 MG capsule Take 100 mg by mouth 2 (two) times daily. Between 8-11 am and 12-3 pm   amLODipine (NORVASC) 5 MG tablet Take 5 mg by mouth daily.   amoxicillin (AMOXIL) 400 MG/5ML suspension Take 6.3 mLs (500 mg total) by mouth 2 (two) times daily. Please flavor.  Duration to be determined by infectious disease MD during outpatient follow-up.   apixaban (ELIQUIS) 5 MG TABS tablet Take 1 tablet (5 mg total) by mouth 2 (  two) times daily.   Artificial Saliva (BIOTENE MOISTURIZING MOUTH MT) Use as directed 2 sprays in the mouth or throat in the morning, at noon, and at bedtime.   atorvastatin (LIPITOR) 80 MG tablet Take 1 tablet (80 mg total) by mouth daily.   bisacodyl (DULCOLAX) 10 MG suppository Place 1 suppository (10 mg total) rectally daily as needed for moderate constipation or mild constipation.   clobetasol cream (TEMOVATE) 1.85 % Apply 1 application topically daily as needed (for skin irritation).    dofetilide (TIKOSYN) 125 MCG capsule Take 3 capsules (375 mcg total) by mouth 2 (two) times daily.   famotidine (PEPCID) 20 MG tablet Take 20 mg by mouth at bedtime as needed for indigestion.   finasteride (PROSCAR) 5 MG tablet Take 1 tablet (5 mg total) by mouth daily.   Glucerna (GLUCERNA) LIQD Take 237 mLs by mouth daily.   hydrocortisone cream 1 % Apply topically 2 (two) times daily as needed for itching (rash).   lacosamide (VIMPAT) 50 MG TABS tablet Take 1 tablet (50 mg total) by mouth 2 (two) times daily.   levalbuterol (XOPENEX) 0.63 MG/3ML nebulizer solution Take 0.63 mg by nebulization every 6 (six) hours as needed for wheezing or shortness of breath.   magnesium gluconate (MAGONATE) 500 MG tablet  Take 1 tablet (500 mg total) by mouth daily.   melatonin 5 MG TABS Take 5 mg by mouth at bedtime.   methenamine (HIPREX) 1 g tablet Take 1 g by mouth every 12 (twelve) hours.   Multiple Vitamin (MULTIVITAMIN WITH MINERALS) TABS tablet Take 1 tablet by mouth daily.   Polyethyl Glycol-Propyl Glycol (SYSTANE) 0.4-0.3 % SOLN Place 2 drops into both eyes daily.   polyethylene glycol (MIRALAX / GLYCOLAX) 17 g packet Take 17 g by mouth daily.   senna-docusate (SENOKOT-S) 8.6-50 MG tablet Take 1 tablet by mouth at bedtime as needed.   torsemide (DEMADEX) 20 MG tablet Take 20 mg by mouth daily as needed.   [DISCONTINUED] amLODipine (NORVASC) 10 MG tablet Take 0.5 tablets (5 mg total) by mouth daily.   [DISCONTINUED] levalbuterol (XOPENEX) 0.63 MG/3ML nebulizer solution Take 0.63 mg by nebulization as needed for wheezing or shortness of breath.   No facility-administered encounter medications on file as of 04/29/2021.    Review of Systems  Unable to perform ROS: Patient nonverbal   Immunization History  Administered Date(s) Administered   Fluad Quad(high Dose 65+) 12/06/2018, 12/09/2020   Influenza Whole 12/25/2008, 01/05/2010, 12/30/2011   Influenza, High Dose Seasonal PF 12/14/2016   Influenza,inj,Quad PF,6+ Mos 12/19/2013   Influenza,inj,quad, With Preservative 11/22/2017   Influenza-Unspecified 12/14/2015, 12/24/2019   Moderna Covid-19 Vaccine Bivalent Booster 62yrs & up 01/12/2021   Moderna SARS-COV2 Booster Vaccination 01/02/2020, 06/26/2020   Moderna Sars-Covid-2 Vaccination 04/08/2019, 05/05/2019   Pneumococcal Conjugate-13 12/19/2013   Pneumococcal Polysaccharide-23 09/24/2007, 05/22/2017   Td 12/25/2008   Tdap 03/22/2018   Zoster Recombinat (Shingrix) 07/16/2019, 10/28/2019   Zoster, Live 09/24/2007   Pertinent  Health Maintenance Due  Topic Date Due   FOOT EXAM  Never done   OPHTHALMOLOGY EXAM  Never done   URINE MICROALBUMIN  05/13/2021 (Originally 10/04/1950)   HEMOGLOBIN A1C   06/17/2021   INFLUENZA VACCINE  Completed   COLONOSCOPY (Pts 45-49yrs Insurance coverage will need to be confirmed)  Discontinued   Fall Risk 01/26/2021 01/26/2021 01/26/2021 01/27/2021 02/24/2021  Falls in the past year? - - - - 0  Was there an injury with Fall? - - - - 0  Fall Risk  Category Calculator - - - - 0  Fall Risk Category - - - - Low  Patient Fall Risk Level High fall risk High fall risk High fall risk High fall risk High fall risk  Patient at Risk for Falls Due to - - - - Impaired mobility  Patient at Risk for Falls Due to - - - - -  Fall risk Follow up - - - - Falls evaluation completed   Functional Status Survey:    Vitals:   04/29/21 1328  BP: (!) 138/91  Pulse: 92  Resp: (!) 22  Temp: (!) 97.5 F (36.4 C)  SpO2: 96%  Weight: 143 lb 1.6 oz (64.9 kg)  Height: 5\' 7"  (1.702 m)   Body mass index is 22.41 kg/m. Physical Exam Vitals and nursing note reviewed.  Constitutional:      General: He is not in acute distress.    Appearance: He is not diaphoretic.  HENT:     Head: Normocephalic and atraumatic.     Mouth/Throat:     Mouth: Mucous membranes are moist.     Pharynx: Oropharynx is clear.  Neck:     Thyroid: No thyromegaly.     Vascular: No JVD.     Trachea: No tracheal deviation.  Cardiovascular:     Rate and Rhythm: Normal rate and regular rhythm.     Heart sounds: No murmur heard.    Comments: Reg with skipped beats Pulmonary:     Effort: Pulmonary effort is normal. No respiratory distress.     Breath sounds: Normal breath sounds. No wheezing.  Abdominal:     General: Bowel sounds are normal. There is no distension.     Palpations: Abdomen is soft.     Tenderness: There is no abdominal tenderness.  Musculoskeletal:     Right lower leg: No edema.     Left lower leg: No edema.  Lymphadenopathy:     Cervical: No cervical adenopathy.  Skin:    General: Skin is warm and dry.  Neurological:     Mental Status: He is alert. Mental status is at  baseline.     Comments: Able to f/c.  Left sided weakness as baseline. Not able to verbalize or answer questions. No tremors.   Psychiatric:     Comments: flat    Labs reviewed: Recent Labs    12/18/20 1630 12/19/20 0610 12/19/20 1632 12/20/20 0153 01/25/21 8250 01/25/21 0853 01/26/21 0435 01/27/21 0538 02/01/21 0000 02/08/21 0000 02/09/21 0000 04/05/21 0000 04/12/21 0000 04/19/21 0000  NA  --  139  --    < > 143  --  143 140   < > 150*   < > 143   143 144 142  K  --  3.5  --    < > 3.6  --  3.8 3.5   < > 3.7   < > 3.9   3.9 3.5 3.6  CL  --  108  --    < > 107  --  104 107   < > 107   < > 106 108 105  CO2  --  23  --    < > 27  --  26 25   < > 22   < > 26* 26* 27*  GLUCOSE  --  186*  --    < > 93  --  105* 137*  --   --   --   --   --   --  BUN  --  16  --    < > 11  --  12 15   < > 23*   < > 17 19 12   CREATININE  --  0.76  --    < > 0.94  --  0.95 1.06   < > 0.9   < > 0.6 0.6 0.5*  CALCIUM  --  7.7*  --    < > 8.2*  --  8.7* 8.7*   < > 9.9   < > 9.3 9.3 9.2  MG 1.9 2.0 1.9   < >  --    < > 2.2 2.4  --  2.1  --   --   --   --   PHOS 3.1 2.7 2.4*  --   --   --   --   --   --   --   --   --   --   --    < > = values in this interval not displayed.   Recent Labs    01/03/21 0221 01/08/21 0000 01/20/21 1631 01/23/21 1355 02/08/21 0000 02/23/21 0000 03/09/21 0000 04/19/21 0000  AST 32   < > 27 37   < > 32 23 23  ALT 63*   < > 35 34   < > 35 23 20  ALKPHOS 94   < > 115 107   < > 244* 230* 175*  BILITOT 0.4  --  0.6 0.7  --   --   --   --   PROT 6.3*  --  6.9 6.9  --   --   --   --   ALBUMIN 1.9*   < > 2.5* 2.4*   < > 3.5 3.6 3.6   < > = values in this interval not displayed.   Recent Labs    08/16/20 0427 08/17/20 0340 12/14/20 1245 12/14/20 1504 12/16/20 0130 12/18/20 0221 01/24/21 0154 01/25/21 0623 01/26/21 0435 02/01/21 0000 02/05/21 1111 02/08/21 0000 03/04/21 0000 03/09/21 0000 04/19/21 0000  WBC 7.9   < > 12.0*   < > 10.7*   < > 10.0  --  10.4    < > 9.8   < > 10.1 9.7 10.3  NEUTROABS 5.7  --  9.3*  --  7.7  --   --   --   --   --   --   --   --   --   --   HGB 9.0*   < > 11.7*   < > 9.9*   < > 7.3*   < > 7.9*   < > 9.8*   < > 10.6* 10.2* 10.3*  HCT 28.2*   < > 36.8*   < > 30.7*   < > 23.3*   < > 25.9*   < > 31.8*   < > 34* 32* 30*  MCV 98.6   < > 90.0   < > 88.0   < > 88.3  --  88.7  --  85.0  --   --   --   --   PLT 177   < > 119*   < > 106*   < > 395  --  417*   < > 462*   < > 313 285 282   < > = values in this interval not displayed.   Lab Results  Component Value Date   TSH 1.773 01/24/2021   Lab Results  Component Value Date  HGBA1C 7.3 (H) 12/17/2020   Lab Results  Component Value Date   CHOL 119 08/08/2020   HDL 42 08/08/2020   LDLCALC 56 08/08/2020   TRIG 105 08/08/2020   CHOLHDL 2.8 08/08/2020    Significant Diagnostic Results in last 30 days:  ECHOCARDIOGRAM COMPLETE  Result Date: 03/31/2021    ECHOCARDIOGRAM REPORT   Patient Name:   Maalik MARZELL ISAKSON Date of Exam: 03/31/2021 Medical Rec #:  [id number]     Height:       67.0 in Accession #:    [id number]    Weight:       143.8 lb Date of Birth:  01/30/1941     BSA:          1.758 m Patient Age:    16 years      BP:           113/69 mmHg Patient Gender: M             HR:           86 bpm. Exam Location:  Outpatient Procedure: 2D Echo, Cardiac Doppler and Color Doppler Indications:    Endocarditis.  History:        Patient has prior history of Echocardiogram examinations, most                 recent 12/18/2020. Abnormal ECG, Stroke, Mitral Valve Disease                 and Aortic Valve Disease, Arrythmias:Atrial Fibrillation and                 Atrial Flutter, Signs/Symptoms:Bacteremia; Risk                 Factors:Hypertension and Diabetes. Rheumatic fever. Mitral and                 aortic valve replacement.                 Aortic Valve: 27 mm Carpentier valve is present in the aortic                 position. Procedure Date: 10/18/04.                 Mitral Valve: unknown  size prosthetic valve is present in the                 mitral position.  Sonographer:    Roseanna Rainbow RDCS Referring Phys: [place] Comments: Technically difficult study due to poor echo windows. Extremely difficult study due to patient being in chair. Patient had left arm strapped to chair. Extremely painful scanning position for sonographer. IMPRESSIONS  1. Technically difficult study due to poor echo windows.  2. Left ventricular ejection fraction, by estimation, is 45 to 50%. The left ventricle has mildly reduced function. Difficult to assess wall motion due to poor acoustic windows. Based on limited views, the basal septal segments appear hypokineitc. There  is mild-to-moderate concentric left ventricular hypertrophy. Diastolic function indeterminant due to MVR.  3. Right ventricular systolic function is mildly reduced. The right ventricular size is normal.  4. The mitral valve has been repaired/replaced. There is a bioprosthetic mitral valve present. Mean gradient 76mmHg at HR 85bpm. LVOT VTI suboptimal however suspect there is a degree of bioprosthetic stenosis. There is mild mitral valve regurgitation.  5. The aortic valve has been repaired/replaced. Aortic valve regurgitation is trivial. There is a 27 mm  Carpentier valve present in the aortic position. Procedure Date: 10/18/04. Mean gradient 82mmHg, Vmax 2.2 m/s. LVOT VTI suboptimal to calculate DI/AVA.  6. Aortic dilatation noted. 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  7. The inferior vena cava is normal in size with greater than 50% respiratory variability, suggesting right atrial pressure of 3 mmHg. Comparison(s): Compared to prior TTE in 12/2020, the EF appears less robust at 45-50% compared to 65% on prior study. MV gradient remains elevated at 59mmHg (previously 62mmHg at HR 101). The aortic valve gradient is less on current study at 33mmHg (previously 47mmHg) but in the setting of reduced LV systolic  function. FINDINGS  Left Ventricle: The basal septal segments appear hypokinetic. Left ventricular ejection fraction, by estimation, is 45 to 50%. The left ventricle has mildly decreased function. The left ventricle demonstrates regional wall motion abnormalities. The left  ventricular internal cavity size was normal in size. There is mild concentric left ventricular hypertrophy. Abnormal (paradoxical) septal motion consistent with post-operative status. Diastolic function indeterminant due to MVR. Right Ventricle: The right ventricular size is normal. No increase in right ventricular wall thickness. Right ventricular systolic function is mildly reduced. Left Atrium: Left atrial size was not well visualized. Right Atrium: Right atrial size was normal in size. Pericardium: There is no evidence of pericardial effusion. Mitral Valve: The mitral valve has been repaired/replaced. Mild mitral valve regurgitation. There is a unknown size prosthetic present in the mitral position. Mild mitral valve stenosis. MV peak gradient, 19.2 mmHg. The mean mitral valve gradient is 8.0 mmHg. Tricuspid Valve: The tricuspid valve is normal in structure. Tricuspid valve regurgitation is trivial. Aortic Valve: The aortic valve has been repaired/replaced. Aortic valve regurgitation is trivial. Aortic valve mean gradient measures 8.5 mmHg. Aortic valve peak gradient measures 17.3 mmHg. Aortic valve area, by VTI measures 2.92 cm. There is a 27 mm Carpentier valve present in the aortic position. Procedure Date: 10/18/04. Pulmonic Valve: The pulmonic valve was normal in structure. Pulmonic valve regurgitation is trivial. Aorta: Aortic dilatation noted. 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 Venous: The inferior vena cava is normal in size with greater than 50% respiratory variability, suggesting right atrial pressure of 3 mmHg. IAS/Shunts: The atrial septum is grossly normal.  LEFT VENTRICLE PLAX 2D LVIDd:          4.50 cm     Diastology LVIDs:         3.20 cm     LV e' medial:    9.57 cm/s LV PW:         1.30 cm     LV E/e' medial:  19.5 LV IVS:        1.20 cm     LV e' lateral:   10.60 cm/s LVOT diam:     3.10 cm     LV E/e' lateral: 17.6 LV SV:         91 LV SV Index:   52 LVOT Area:     7.55 cm  LV Volumes (MOD) LV vol d, MOD A2C: 64.8 ml LV vol d, MOD A4C: 87.0 ml LV vol s, MOD A2C: 32.4 ml LV vol s, MOD A4C: 58.6 ml LV SV MOD A2C:     32.4 ml LV SV MOD A4C:     87.0 ml LV SV MOD BP:      34.6 ml RIGHT VENTRICLE         IVC TAPSE (M-mode): 1.0 cm  IVC diam: 1.60 cm LEFT ATRIUM             Index        RIGHT ATRIUM           Index LA diam:        5.00 cm 2.84 cm/m   RA Area:     14.00 cm LA Vol (A2C):   43.3 ml 24.63 ml/m  RA Volume:   27.80 ml  15.82 ml/m LA Vol (A4C):   53.2 ml 30.27 ml/m LA Biplane Vol: 52.1 ml 29.64 ml/m  AORTIC VALVE AV Area (Vmax):    2.69 cm AV Area (Vmean):   2.56 cm AV Area (VTI):     2.92 cm AV Vmax:           208.00 cm/s AV Vmean:          134.000 cm/s AV VTI:            0.312 m AV Peak Grad:      17.3 mmHg AV Mean Grad:      8.5 mmHg LVOT Vmax:         74.05 cm/s LVOT Vmean:        45.400 cm/s LVOT VTI:          0.120 m LVOT/AV VTI ratio: 0.39  AORTA Ao Root diam: 4.40 cm Ao Asc diam:  5.10 cm MITRAL VALVE MV Area (PHT): 1.58 cm     SHUNTS MV Area VTI:   1.74 cm     Systemic VTI:  0.12 m MV Peak grad:  19.2 mmHg    Systemic Diam: 3.10 cm MV Mean grad:  8.0 mmHg MV Vmax:       2.19 m/s MV Vmean:      134.0 cm/s MV Decel Time: 479 msec MV E velocity: 187.00 cm/s Gwyndolyn Kaufman MD Electronically signed by Gwyndolyn Kaufman MD Signature Date/Time: 03/31/2021/5:10:46 PM    Final     Assessment/Plan  1. Lethargy He did not screen positive for depression but this was in part due to his TBI/CVA. In weighing the risk/benefit I would not start an SSRI at this time due to risk for further QT prolongation   2. Prolonged Q-T interval on ECG Monitored by cardiology on tikosyn K has  been <4 recently. Goal is documented at 4. Will try kdur 10 meq daily. He has had trouble with the 20 meq dose with taste, swallow/etc Also on Mg for the same reason.    Family/ staff Communication: discussed with his wife Herbert Pun  Labs/tests ordered:  Has BMP weekly

## 2021-04-30 DIAGNOSIS — M6389 Disorders of muscle in diseases classified elsewhere, multiple sites: Secondary | ICD-10-CM | POA: Diagnosis not present

## 2021-04-30 DIAGNOSIS — R41840 Attention and concentration deficit: Secondary | ICD-10-CM | POA: Diagnosis not present

## 2021-04-30 DIAGNOSIS — R278 Other lack of coordination: Secondary | ICD-10-CM | POA: Diagnosis not present

## 2021-04-30 DIAGNOSIS — I6601 Occlusion and stenosis of right middle cerebral artery: Secondary | ICD-10-CM | POA: Diagnosis not present

## 2021-04-30 DIAGNOSIS — H538 Other visual disturbances: Secondary | ICD-10-CM | POA: Diagnosis not present

## 2021-04-30 DIAGNOSIS — I69812 Visuospatial deficit and spatial neglect following other cerebrovascular disease: Secondary | ICD-10-CM | POA: Diagnosis not present

## 2021-04-30 DIAGNOSIS — I69391 Dysphagia following cerebral infarction: Secondary | ICD-10-CM | POA: Diagnosis not present

## 2021-04-30 DIAGNOSIS — I5022 Chronic systolic (congestive) heart failure: Secondary | ICD-10-CM | POA: Diagnosis not present

## 2021-04-30 DIAGNOSIS — R41841 Cognitive communication deficit: Secondary | ICD-10-CM | POA: Diagnosis not present

## 2021-04-30 DIAGNOSIS — R293 Abnormal posture: Secondary | ICD-10-CM | POA: Diagnosis not present

## 2021-04-30 DIAGNOSIS — I5032 Chronic diastolic (congestive) heart failure: Secondary | ICD-10-CM | POA: Diagnosis not present

## 2021-04-30 DIAGNOSIS — G9341 Metabolic encephalopathy: Secondary | ICD-10-CM | POA: Diagnosis not present

## 2021-04-30 DIAGNOSIS — R9431 Abnormal electrocardiogram [ECG] [EKG]: Secondary | ICD-10-CM | POA: Insufficient documentation

## 2021-04-30 DIAGNOSIS — I69354 Hemiplegia and hemiparesis following cerebral infarction affecting left non-dominant side: Secondary | ICD-10-CM | POA: Diagnosis not present

## 2021-04-30 DIAGNOSIS — S065X3S Traumatic subdural hemorrhage with loss of consciousness of 1 hour to 5 hours 59 minutes, sequela: Secondary | ICD-10-CM | POA: Diagnosis not present

## 2021-04-30 MED ORDER — POTASSIUM CHLORIDE CRYS ER 10 MEQ PO TBCR: 10.0000 meq | EXTENDED_RELEASE_TABLET | Freq: Every day | ORAL | 0 refills | Status: DC

## 2021-05-03 ENCOUNTER — Telehealth: Payer: Self-pay | Admitting: Adult Health

## 2021-05-03 DIAGNOSIS — R41840 Attention and concentration deficit: Secondary | ICD-10-CM | POA: Diagnosis not present

## 2021-05-03 DIAGNOSIS — G9341 Metabolic encephalopathy: Secondary | ICD-10-CM | POA: Diagnosis not present

## 2021-05-03 DIAGNOSIS — R293 Abnormal posture: Secondary | ICD-10-CM | POA: Diagnosis not present

## 2021-05-03 DIAGNOSIS — M6389 Disorders of muscle in diseases classified elsewhere, multiple sites: Secondary | ICD-10-CM | POA: Diagnosis not present

## 2021-05-03 DIAGNOSIS — I38 Endocarditis, valve unspecified: Secondary | ICD-10-CM | POA: Diagnosis not present

## 2021-05-03 DIAGNOSIS — I69812 Visuospatial deficit and spatial neglect following other cerebrovascular disease: Secondary | ICD-10-CM | POA: Diagnosis not present

## 2021-05-03 DIAGNOSIS — R41841 Cognitive communication deficit: Secondary | ICD-10-CM | POA: Diagnosis not present

## 2021-05-03 DIAGNOSIS — I5022 Chronic systolic (congestive) heart failure: Secondary | ICD-10-CM | POA: Diagnosis not present

## 2021-05-03 DIAGNOSIS — I6601 Occlusion and stenosis of right middle cerebral artery: Secondary | ICD-10-CM | POA: Diagnosis not present

## 2021-05-03 DIAGNOSIS — R278 Other lack of coordination: Secondary | ICD-10-CM | POA: Diagnosis not present

## 2021-05-03 DIAGNOSIS — I69391 Dysphagia following cerebral infarction: Secondary | ICD-10-CM | POA: Diagnosis not present

## 2021-05-03 DIAGNOSIS — I69354 Hemiplegia and hemiparesis following cerebral infarction affecting left non-dominant side: Secondary | ICD-10-CM | POA: Diagnosis not present

## 2021-05-03 DIAGNOSIS — H538 Other visual disturbances: Secondary | ICD-10-CM | POA: Diagnosis not present

## 2021-05-03 DIAGNOSIS — I5032 Chronic diastolic (congestive) heart failure: Secondary | ICD-10-CM | POA: Diagnosis not present

## 2021-05-03 DIAGNOSIS — S065X3S Traumatic subdural hemorrhage with loss of consciousness of 1 hour to 5 hours 59 minutes, sequela: Secondary | ICD-10-CM | POA: Diagnosis not present

## 2021-05-03 LAB — BASIC METABOLIC PANEL
BUN: 11 (ref 4–21)
CO2: 25 — AB (ref 13–22)
Chloride: 104 (ref 99–108)
Creatinine: 0.5 — AB (ref 0.6–1.3)
Glucose: 99
Potassium: 3.8 (ref 3.4–5.3)
Sodium: 139 (ref 137–147)

## 2021-05-03 LAB — COMPREHENSIVE METABOLIC PANEL: Calcium: 9.5 (ref 8.7–10.7)

## 2021-05-03 NOTE — Telephone Encounter (Signed)
Contacted spouse back, LVM informing wife of jessica's recommendations. Advised to call back with questions or respond via pts mychart

## 2021-05-03 NOTE — Telephone Encounter (Signed)
Pt's wife called in requesting to speak with Janett Billow privately at some point during pt's appointment on 3/2.

## 2021-05-03 NOTE — Telephone Encounter (Signed)
Unfortunately, due to time constraints, there is typically not enough time to speak to family members separately. If she would like to send a message through his MyChart to be reviewed prior to visit, that would be fine but I may have to bring up some of these concerns during the visit if needed. Thank you.

## 2021-05-04 DIAGNOSIS — I69812 Visuospatial deficit and spatial neglect following other cerebrovascular disease: Secondary | ICD-10-CM | POA: Diagnosis not present

## 2021-05-04 DIAGNOSIS — R41841 Cognitive communication deficit: Secondary | ICD-10-CM | POA: Diagnosis not present

## 2021-05-04 DIAGNOSIS — S065X3S Traumatic subdural hemorrhage with loss of consciousness of 1 hour to 5 hours 59 minutes, sequela: Secondary | ICD-10-CM | POA: Diagnosis not present

## 2021-05-04 DIAGNOSIS — M6389 Disorders of muscle in diseases classified elsewhere, multiple sites: Secondary | ICD-10-CM | POA: Diagnosis not present

## 2021-05-04 DIAGNOSIS — R41840 Attention and concentration deficit: Secondary | ICD-10-CM | POA: Diagnosis not present

## 2021-05-04 DIAGNOSIS — G9341 Metabolic encephalopathy: Secondary | ICD-10-CM | POA: Diagnosis not present

## 2021-05-04 DIAGNOSIS — I69391 Dysphagia following cerebral infarction: Secondary | ICD-10-CM | POA: Diagnosis not present

## 2021-05-04 DIAGNOSIS — I5022 Chronic systolic (congestive) heart failure: Secondary | ICD-10-CM | POA: Diagnosis not present

## 2021-05-04 DIAGNOSIS — I5032 Chronic diastolic (congestive) heart failure: Secondary | ICD-10-CM | POA: Diagnosis not present

## 2021-05-04 DIAGNOSIS — I69354 Hemiplegia and hemiparesis following cerebral infarction affecting left non-dominant side: Secondary | ICD-10-CM | POA: Diagnosis not present

## 2021-05-04 DIAGNOSIS — R293 Abnormal posture: Secondary | ICD-10-CM | POA: Diagnosis not present

## 2021-05-04 DIAGNOSIS — I6601 Occlusion and stenosis of right middle cerebral artery: Secondary | ICD-10-CM | POA: Diagnosis not present

## 2021-05-04 DIAGNOSIS — R278 Other lack of coordination: Secondary | ICD-10-CM | POA: Diagnosis not present

## 2021-05-04 DIAGNOSIS — H538 Other visual disturbances: Secondary | ICD-10-CM | POA: Diagnosis not present

## 2021-05-05 DIAGNOSIS — G9341 Metabolic encephalopathy: Secondary | ICD-10-CM | POA: Diagnosis not present

## 2021-05-05 DIAGNOSIS — M6389 Disorders of muscle in diseases classified elsewhere, multiple sites: Secondary | ICD-10-CM | POA: Diagnosis not present

## 2021-05-05 DIAGNOSIS — R278 Other lack of coordination: Secondary | ICD-10-CM | POA: Diagnosis not present

## 2021-05-05 DIAGNOSIS — I5022 Chronic systolic (congestive) heart failure: Secondary | ICD-10-CM | POA: Diagnosis not present

## 2021-05-05 DIAGNOSIS — R41840 Attention and concentration deficit: Secondary | ICD-10-CM | POA: Diagnosis not present

## 2021-05-05 DIAGNOSIS — R293 Abnormal posture: Secondary | ICD-10-CM | POA: Diagnosis not present

## 2021-05-05 DIAGNOSIS — I6601 Occlusion and stenosis of right middle cerebral artery: Secondary | ICD-10-CM | POA: Diagnosis not present

## 2021-05-05 DIAGNOSIS — I69812 Visuospatial deficit and spatial neglect following other cerebrovascular disease: Secondary | ICD-10-CM | POA: Diagnosis not present

## 2021-05-05 DIAGNOSIS — I69354 Hemiplegia and hemiparesis following cerebral infarction affecting left non-dominant side: Secondary | ICD-10-CM | POA: Diagnosis not present

## 2021-05-05 DIAGNOSIS — R41841 Cognitive communication deficit: Secondary | ICD-10-CM | POA: Diagnosis not present

## 2021-05-05 DIAGNOSIS — S065X3S Traumatic subdural hemorrhage with loss of consciousness of 1 hour to 5 hours 59 minutes, sequela: Secondary | ICD-10-CM | POA: Diagnosis not present

## 2021-05-05 DIAGNOSIS — H538 Other visual disturbances: Secondary | ICD-10-CM | POA: Diagnosis not present

## 2021-05-05 DIAGNOSIS — I69391 Dysphagia following cerebral infarction: Secondary | ICD-10-CM | POA: Diagnosis not present

## 2021-05-05 DIAGNOSIS — I5032 Chronic diastolic (congestive) heart failure: Secondary | ICD-10-CM | POA: Diagnosis not present

## 2021-05-06 ENCOUNTER — Encounter: Payer: Self-pay | Admitting: Adult Health

## 2021-05-06 ENCOUNTER — Other Ambulatory Visit: Payer: Self-pay

## 2021-05-06 ENCOUNTER — Ambulatory Visit (INDEPENDENT_AMBULATORY_CARE_PROVIDER_SITE_OTHER): Payer: Medicare Other | Admitting: Adult Health

## 2021-05-06 VITALS — BP 113/78 | HR 83

## 2021-05-06 DIAGNOSIS — I609 Nontraumatic subarachnoid hemorrhage, unspecified: Secondary | ICD-10-CM

## 2021-05-06 DIAGNOSIS — I639 Cerebral infarction, unspecified: Secondary | ICD-10-CM

## 2021-05-06 DIAGNOSIS — I69354 Hemiplegia and hemiparesis following cerebral infarction affecting left non-dominant side: Secondary | ICD-10-CM | POA: Diagnosis not present

## 2021-05-06 DIAGNOSIS — I63411 Cerebral infarction due to embolism of right middle cerebral artery: Secondary | ICD-10-CM

## 2021-05-06 DIAGNOSIS — I5022 Chronic systolic (congestive) heart failure: Secondary | ICD-10-CM | POA: Diagnosis not present

## 2021-05-06 DIAGNOSIS — I69398 Other sequelae of cerebral infarction: Secondary | ICD-10-CM

## 2021-05-06 DIAGNOSIS — S065X3S Traumatic subdural hemorrhage with loss of consciousness of 1 hour to 5 hours 59 minutes, sequela: Secondary | ICD-10-CM | POA: Diagnosis not present

## 2021-05-06 DIAGNOSIS — I5032 Chronic diastolic (congestive) heart failure: Secondary | ICD-10-CM | POA: Diagnosis not present

## 2021-05-06 DIAGNOSIS — Z9189 Other specified personal risk factors, not elsewhere classified: Secondary | ICD-10-CM

## 2021-05-06 DIAGNOSIS — R569 Unspecified convulsions: Secondary | ICD-10-CM | POA: Diagnosis not present

## 2021-05-06 DIAGNOSIS — M6389 Disorders of muscle in diseases classified elsewhere, multiple sites: Secondary | ICD-10-CM | POA: Diagnosis not present

## 2021-05-06 DIAGNOSIS — R41840 Attention and concentration deficit: Secondary | ICD-10-CM | POA: Diagnosis not present

## 2021-05-06 DIAGNOSIS — H538 Other visual disturbances: Secondary | ICD-10-CM | POA: Diagnosis not present

## 2021-05-06 DIAGNOSIS — G9341 Metabolic encephalopathy: Secondary | ICD-10-CM | POA: Diagnosis not present

## 2021-05-06 DIAGNOSIS — I69812 Visuospatial deficit and spatial neglect following other cerebrovascular disease: Secondary | ICD-10-CM | POA: Diagnosis not present

## 2021-05-06 DIAGNOSIS — R41841 Cognitive communication deficit: Secondary | ICD-10-CM | POA: Diagnosis not present

## 2021-05-06 DIAGNOSIS — R278 Other lack of coordination: Secondary | ICD-10-CM | POA: Diagnosis not present

## 2021-05-06 DIAGNOSIS — I69391 Dysphagia following cerebral infarction: Secondary | ICD-10-CM | POA: Diagnosis not present

## 2021-05-06 DIAGNOSIS — I6601 Occlusion and stenosis of right middle cerebral artery: Secondary | ICD-10-CM | POA: Diagnosis not present

## 2021-05-06 DIAGNOSIS — R293 Abnormal posture: Secondary | ICD-10-CM | POA: Diagnosis not present

## 2021-05-06 NOTE — Progress Notes (Signed)
[facility] [address] [place]. [place] [address] [phone]       HOSPITAL FOLLOW UP NOTE  [name] [name] Date of Birth:  [birth year] Medical Record Number:  [id number]   Reason for Referral:  hospital stroke follow up    SUBJECTIVE:   CHIEF COMPLAINT:  Chief Complaint  Patient presents with   Follow-up    RM 3 with wife [name] [name] and son [name] [name]  Pt spouse states he is more quite now and sleep more. Also states more spacticity     HPI:   Update 05/06/2021 JM: Patient returns for 48-month stroke follow-up accompanied by his wife and son who provide majority of history. Wife and son's main concern is in regards to sleeping more frequently and being "more quiet". Was evaluated by SNF provider 2/23 regarding these concerns and possible depression. 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 Per family, symptoms have been gradually worsening since November. Wife has multiple questions regarding reason for fatigue and not speaking as much. Son mentions symptoms can fluctuate with good days/bad days. Typically does not sleep well throughout the night and does snore. At prior visit, [name] [name] d/c'd amantadine due to concern of tremor side effects but has since been restarted on this by [name] [name] with noting some improvement of lethargy and attention, tremors have not returned. Wife also mentions worsening spasticity.  He remains nonambulatory.  Currently working with OT and SLP. Otherwise stable from stroke standpoint without new stroke/TIA symptoms.  Remains on Eliquis without side effects.  Remains on Vimpat 50 mg twice daily, denies side effects, no seizure activity.  Routinely follows with cardiology, infectious disease, and SNF providers.  No further concerns at this time.     History provided for reference purposes only Update 02/03/21 [name] [name]:  He returns for follow-up after last visit 3 months ago.  Is accompanied by his wife.  Patient has been hospitalized twice since then.  He was hospitalized from October 13 at [facility] with dehydration, hyponatremia, urinary tract infection and Enterococcus bacteremia with endocarditis.  MRI scan showed a small cerebellar infarct.  Patient was quite lethargic during hospitalization this was felt to be related to [place] and it was changed to Vimpat.  He was back in the nursing home but returned for an admission from November 19 to 23 for exacerbation of congestive heart failure.  Is found to have pulmonary edema and was diuresed.  He is now back in the nursing home.  He still wheelchair-bound.  Has been going to work with physical therapist but is still not able to walk.  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  He has had no breakthrough seizures.  Patient had developed significant tremors in the extremities as this was felt to be mentating which was started for promoting wakefulness and had reduced the dose to 100 milligrams twice daily which has shown some reduction in his tremors however tremor still persist.  He is back on Eliquis for stroke prevention for his A. fib and is having minor bruising but no bleeding episodes.  Blood pressures well controlled today it is 98/67.  He continues to have significant cognitive impairment following his strokes and wife asks questions about his overall prognosis  Initial visit 10/27/2020 JM: [name] [name] is being seen for hospital follow-up accompanied by his wife.  He continues to reside at [facility] currently receiving PT/OT/SLP for residual left hemiparesis, gait impairment/imbalance, dysphagia, cognitive impairment and speech difficulties.  Wife provides majority of history. She does report improvement since discharge.  He is able to ambulate short distance during therapy sessions only otherwise transfers via [facility].  Mental status fluctuates.  Swallowing has improved currently on mechanical soft diet and NTL without difficulty and has good appetite.  He is able to feed himself.  She denies any word finding or slurred speech but more issues with vocal intensity.  Currently being followed by [facility] for retention issues with recent use of Foley catheter but is now currently being straight cathed as he was consistently pulling on catheter tubing. Denies new stroke/TIA symptoms. Per MAR review, remains on Eliquis and atorvastatin.  Also remains on Keppra 500 mg twice daily. No seizure activity noted.  Blood pressure today 120/75. She questions time frame of repeat imaging, medications to help with cognition, and expectations of recovery.  No further concerns at this time.  Stroke admission 07/26/2020 [name] [name] is a 81 y.o. male with PMHx of HTN, HLD, DM, hx of prior stroke 2015, aortic valve replacement, mitral valve replacement, acute rheumatic pericarditis (age 19/7), diastolic CHF, AVR/MVR and A. fib/A-flutter on Coumadin/Tikosyn who presented on 07/26/2020 after a fall at [place] falling backwards and striking his head - ED eval no acute findings and discharged home. Returned on 5/23 with difficulty speaking and right-sided weakness with imaging now showing large right greater than left traumatic subdural hemorrhage with mass-effect and subfalcine herniation.  Initially poor prognosis with family electing comfort care measures however patient started showing improvement and taken the OR for right craniotomy with evacuation of SDH by Dr. Ronnald Ramp.  Continue to be limited by left greater than right-sided weakness with visual deficits, poor safety awareness, delirium and cognitive deficits.  CIR recommended and discharged on 07/31/2020.  On 6/3, he was found to have decreased LOC with left-sided weakness and right inattention.  Code stroke activated and CTA showed occluded right ICA s/p cerebral angio with  revascularization of occluded right ICA terminus. MR brain revealed acute infarct throughout majority of right MCA territory with some sparing of anterior insula and frontal operculum, petechial blood products and mass-effect with 7.5 mm right to left shift.  Mentation initially improving however repeat CT head showed 9 mm midline shift -hypertonic saline initiated.  Repeat CT head 6/13 slightly increased leftward midline shift measuring 7 mm.  Evaluated by [name] [name] who felt R ICA occlusion possibly in setting of A. fib with interruption of AC in setting of recent bleed -aspirin 81 mg daily initiated in consideration of initiating DOAC 10 days post stroke.  EF 60 to 65% with severely dilated LA.  LDL 56.  A1c 6.2.  Residual deficits of dysarthria, aphasia, dysphagia (NPO status), and left hemiparesis -discharged back to CIR on 6/11 - 7/14. CIR admission significant for UTI tx'd with Keflex, facial twitching with negative EEG but started on Keppra 500 mg twice daily for seizure prophylaxis, and repeat CT head 6/20 showed decrease edema and Eliquis initiated 6/21.  Progress limited by bouts of lethargy, restlessness and cognitive deficits -family elected SNF for progressive therapy and discharged to [facility] 7/14.        PERTINENT IMAGING   EEG 08/29/2020 Impression and clinical correlation:  This EEG was obtained while awake and drowsy and is abnormal due to mild diffuse slowing and focal slowing over the left temporal  region, reflecting generalized cerebral dysfunction with focal dysfunction superimposed over the left temporal region. Beta frequencies reflect medication effect.   MR BRAIN 08/29/2020 IMPRESSION: 1. No acute or interval finding. 2. Evolving right MCA distribution infarct and bilateral extra-axial hemorrhage. Midline shift to the left measures 3-4 mm.   CT HEAD 08/28/2020 IMPRESSION: Evolving right MCA territory infarct with slightly increased petechial hemorrhage and/or  laminar necrosis. No mass occupying acute hemorrhagic transformation. Similar small right cerebral convexity subdural collection. Mass effect appears mildly improved with decreased (now 3 mm) of leftward midline shift at the foramen of [place].   CT HEAD 08/24/2020 IMPRESSION: Evolving right MCA territory infarction and residual right cerebral convexity subdural collection. Mild mass effect persists. No new hemorrhage. No new loss of gray-white differentiation.   CT HEAD 08/22/2020 IMPRESSION: 1. Continued evolution of the right MCA territory infarct, with decreasing sulcal effacement but persistent leftward midline shift. 2. Stable small residual right hemispheric subdural hematoma, measuring less than 5 mm in thickness.   CEREBRAL ANGIO 09/03/2020 IMPRESSION: Status post endovascular complete revascularization of occluded internal carotid artery extra cranially and intracranially, and also of the inferior division of the right middle cerebral artery achieving a TICI 2C revascularization, with 1 pass with a 6 mm x 40 mm Solitaire X retrieval device and contact aspiration, and Embotrap 6.5 mm x 45 mm retrieval device, and constant aspiration.   CT HEAD 08/17/2020 IMPRESSION: 1. Slightly increased leftward midline shift measuring 7 mm. Otherwise unchanged appearance of right MCA territory infarct. 2. Unchanged small right hemispheric subdural hematoma.  CT HEAD 08/12/2020 IMPRESSION: Evolving large right MCA territory infarction with similar mass effect. Petechial hemorrhage is present without discrete hematoma.   Similar posterior left cerebral convexity mixed density subdural hematoma and remaining mixed density subdural hematoma underlying craniotomy.   No new hemorrhage.   CT HEAD 08/10/2020 IMPRESSION: 1. Decreased mass effect associated with the right MCA territory infarct. 2. Stable bilateral extra-axial collections. 3. Stable left parietal infarct and extra-axial  hemorrhage. 4. No new infarct.   CT HEAD 08/09/2020 IMPRESSION: 1. Evolved relatively large right MCA infarcts since 08/07/2020 with increased intracranial mass effect and increased leftward midline shift now 9 mm. 2. No malignant hemorrhagic transformation. Pre-existing right greater than left subdural hematomas and trace IVH.   MR BRAIN MRA HEAD  08/07/2020 IMPRESSION: Confluent acute infarction throughout the majority of the right middle cerebral artery territory, with some sparing of the anterior insula and frontal operculum. Spotty involvement of the right caudate and putamen. Swelling and petechial blood products but no frank hematoma. Mass effect with right-to-left shift of 7.5 mm.   Recent right parietal craniectomy for evacuation of subdural hematoma. Residual subdural blood and fluid on the right, maximal thickness 8 mm. Residual subdural blood in the left parieto-occipital region, maximal thickness 1 cm.   Small amount of blood dependent in the occipital horns of the lateral ventricles without evidence of change in ventricular size.   Small hemorrhagic contusion in the region of subacute infarction in the right parietooccipital junction.   MR angiography negative for large or medium vessel occlusion presently. Luxury perfusion in the MCA branches.   CTA HEAD/NECK 08/07/2020 IMPRESSION: CTA neck:   1. The right internal carotid artery becomes occluded shortly beyond its origin and remains occluded throughout the remainder of the neck. 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 2. The left common carotid and internal carotid arteries are patent within the neck without stenosis. Mild calcified plaque within the  left carotid bifurcation. 3. Vertebral arteries patent within the neck. Moderate atherosclerotic narrowing at the origin of the left vertebral artery. 4. The partially imaged ascending thoracic aorta measures up to 4 cm in diameter.  Nonemergent CTA of the chest is recommended to more fully assess the degree of acid ending thoracic aortic dilation, and to determine appropriate follow-up.   CTA head:   1. The intracranial right internal carotid artery is occluded with the exception of the very distal right ICA terminus. 2. The M1 right middle cerebral artery is patent, likely due to cross flow via the anterior communicating artery and right A1 segment. However, there is occlusion of a proximal right M2 MCA vessel. 3. Apparent flow gaps within a right PCA P3/P4 branch, which may reflect short-segment occlusions or high-grade stenoses.   CT perfusion head:   1. Please note the reliability of the perfusion data is limited due to bolus quality and motion degradation. 2. The perfusion software identifies a 45 mL core infarct within the right MCA vascular territory. However, the size of the core infarct is likely underestimated when correlating with the concurrently performed noncontrast head CT. 3. The perfusion software identifies a 162 mL region of hypoperfusion predominantly within the right MCA vascular territory. Reported mismatch volume: 117 mL.   CT HEAD 08/07/2020 IMPRESSION: 1. 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 Asymmetric hyperdensity of the M1 and M2 right middle cerebral artery vessels, likely reflecting endoluminal thrombus. 2. Unchanged subdural hemorrhage overlying the right cerebral hemisphere. 3. Continued interval decrease in conspicuity of interhemispheric subdural and subarachnoid hemorrhage. 4. Unchanged size of an extra-axial hemorrhage overlying the left parietal lobe, again measuring up to 10 mm in thickness. 5. Persistent trace intraventricular hemorrhage. No evidence of hydrocephalus. 6. Unchanged mass effect with 4 mm leftward midline shift. 7. Right maxillary sinusitis.   CT  HEAD 08/03/2020 IMPRESSION: Decreasing intracranial hemorrhage.  No new abnormality.   CT HEAD 07/29/2020 IMPRESSION: 1. Significantly decreased size of an acute right subdural hemorrhage status post craniotomy and drainage, as detailed above. Improved mass effect with 5 mm of leftward midline shift. 2. Similar subdural hemorrhage along the falx and right tentorial leaflet, small volume high left frontal parasagittal subarachnoid hemorrhage, small volume intraventricular hemorrhage, and additional 1.1 cm thick extra-axial hemorrhage along the left parietotemporal convexity.   CT HEAD 07/28/2020 IMPRESSION: Persistent bilateral subdural hematomas slightly smaller than that seen on the prior exam with a lesser degree of midline shift when compared with the prior study.   Small amount of intraventricular hemorrhage is noted in the left lateral ventricle.   Atrophic changes and findings of prior left temporoparietal infarct are noted.   CT HEAD 07/27/2020 IMPRESSION: Acute right much larger than left subdural hematomas. Additional small volume subdural hemorrhage along the falx and tentorium.   Mass effect including subfalcine herniation with leftward midline shift of 1.8 cm and early trapping of the left lateral ventricle.   CT HEAD 07/26/2020 IMPRESSION: Old left posterior parietal infarct.   No acute intracranial abnormality.       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    Other and unspecified hyperlipidemia    Pre-diabetes    Previous back surgery 1978, jan 2007   Psychosexual dysfunction with inhibited sexual excitement    SOB (shortness of breath)    "with heavy exercise"   Stroke ([place]) 08/2013   "I WAS IN AFIB AND THREW A CLOT, THE EFFECTS WERE TRANSITORY AND DIDNT LAST BUT FOR 30 MINUTES"    Thoracic aortic aneurysm     PSH:  Past Surgical History:  Procedure Laterality Date   AORTIC AND MITRAL VALVE REPLACEMENT     09/2004   CARDIOVERSION     3 times from 2004-2006   CARDIOVERSION N/A 09/26/2013   Procedure: CARDIOVERSION;  Surgeon: Larey Dresser, MD;  Location: [place];  Service: Cardiovascular;  Laterality: N/A;   CARDIOVERSION N/A 06/19/2014   Procedure: CARDIOVERSION;  Surgeon: Jerline Pain, MD;  Location: [place];  Service: Cardiovascular;  Laterality: N/A;   CARDIOVERSION N/A 04/24/2017   Procedure: CARDIOVERSION;  Surgeon: Larey Dresser, MD;  Location: [place];  Service: Cardiovascular;  Laterality: N/A;   CARDIOVERSION N/A 06/08/2017   Procedure: CARDIOVERSION;  Surgeon: Pixie Casino, MD;  Location: [facility] ENDOSCOPY;  Service: Cardiovascular;  Laterality: N/A;   CARDIOVERSION N/A 08/24/2017   Procedure: CARDIOVERSION;  Surgeon: Skeet Latch, MD;  Location: [facility] ENDOSCOPY;  Service: Cardiovascular;  Laterality: N/A;   CARDIOVERSION N/A 02/14/2018   Procedure: CARDIOVERSION;  Surgeon: Larey Dresser, MD;  Location: [facility] ENDOSCOPY;  Service: Cardiovascular;  Laterality: N/A;   COLONOSCOPY     CRANIOTOMY N/A 07/28/2020   Procedure: CRANIOTOMY FOR EVACUATION OF SUBDURAL HEMATOMA;  Surgeon: Eustace Kilts, MD;  Location: [place];  Service: Neurosurgery;  Laterality: N/A;   IR ANGIO VERTEBRAL SEL SUBCLAVIAN INNOMINATE UNI R MOD SED  08/07/2020   IR CT HEAD LTD  08/07/2020   IR PERCUTANEOUS ART THROMBECTOMY/INFUSION INTRACRANIAL INC DIAG ANGIO  08/07/2020   laminectomies     10/1975 and in 03/2005    RADIOLOGY WITH ANESTHESIA N/A 08/07/2020   Procedure: IR WITH ANESTHESIA;  Surgeon: Luanne Bras, MD;  Location: [place];  Service: Radiology;  Laterality: N/A;   TEE WITHOUT CARDIOVERSION N/A 12/18/2020   Procedure: TRANSESOPHAGEAL ECHOCARDIOGRAM (TEE);  Surgeon: Larey Dresser, MD;  Location: [facility] ENDOSCOPY;  Service: Cardiovascular;  Laterality: N/A;   [place] Right 04/03/2018   Procedure: TOTAL HIP ARTHROPLASTY ANTERIOR APPROACH;  Surgeon: Paralee Cancel, MD;  Location: [facility];  Service: Orthopedics;  Laterality: Right;  70 mins    Social History:  Social History   Socioeconomic History   Marital status: Married    Spouse name: Herbert Pun   Number of children: 2   Years of education: BS   Highest education level: Not on file  Occupational History   Occupation: retired  Tobacco Use   Smoking status: Never   Smokeless tobacco: Never  Vaping Use   Vaping Use: Never used  Substance and Sexual Activity   Alcohol use: Never    Comment: wine occasionally   Drug use: Never   Sexual activity: Yes    Partners: Female  Other Topics  Concern   Not on file  Social History Narrative   10/27/20 residing at [facility] care center since 09/17/20    [employer] - BS, JD. married - '67. 2 sons - '74, '77  one son is a Nurse, learning disability for EMS - [employer]; 4 grandchildren. 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 marriage in good health. End-of-life: discussed issues and provided packet with the MOST form and out of facility order.   Social Determinants of Health   Financial Resource Strain: Not on file  Food Insecurity: Not on file  Transportation Needs: Not on file  Physical Activity: Not on file  Stress: Not on file  Social Connections: Not on file  Intimate Partner Violence: Not on file    Family History:  Family History  Problem Relation Age of Onset   Macular  degeneration Mother        macular degeneration   Cancer Father        intestinal/GI   Diabetes Paternal Aunt    Heart attack Maternal Grandfather    Diabetes Brother     Medications:   Current Outpatient Medications on File Prior to Visit  Medication Sig Dispense Refill   amantadine (SYMMETREL) 100 MG capsule Take 100 mg by mouth 2 (two) times daily. Between 8-11 am and 12-3 pm     amLODipine (NORVASC) 5 MG tablet Take 5 mg by mouth daily.     amoxicillin (AMOXIL) 400 MG/5ML suspension Take 6.3 mLs (500 mg total) by mouth 2 (two) times daily. Please flavor.  Duration to be determined by infectious disease MD during outpatient follow-up.     apixaban (ELIQUIS) 5 MG TABS tablet Take 1 tablet (5 mg total) by mouth 2 (two) times daily. 60 tablet 0   Artificial Saliva (BIOTENE MOISTURIZING MOUTH MT) Use as directed 2 sprays in the mouth or throat in the morning, at noon, and at bedtime.     atorvastatin (LIPITOR) 80 MG tablet Take 1 tablet (80 mg total) by mouth daily.     bisacodyl (DULCOLAX) 10 MG suppository Place 1 suppository (10 mg total) rectally daily as needed for moderate constipation or mild constipation.     clobetasol cream (TEMOVATE) 2.29 % Apply 1 application topically daily as needed (for skin irritation).      dofetilide (TIKOSYN) 125 MCG capsule Take 3 capsules (375 mcg total) by mouth 2 (two) times daily.     famotidine (PEPCID) 20 MG tablet Take 20 mg by mouth at bedtime as needed for indigestion.     finasteride (PROSCAR) 5 MG tablet Take 1 tablet (5 mg total) by mouth daily. 30 tablet 0   Glucerna (GLUCERNA) LIQD Take 237 mLs by mouth daily.     hydrocortisone cream 1 % Apply topically 2 (two) times daily as needed for itching (rash). 30 g 0   lacosamide (VIMPAT) 50 MG TABS tablet Take 1 tablet (50 mg total) by mouth 2 (two) times daily. 60 tablet 3   levalbuterol (XOPENEX) 0.63 MG/3ML nebulizer solution Take 0.63 mg by nebulization every 6 (six) hours as needed for  wheezing or shortness of breath.     magnesium gluconate (MAGONATE) 500 MG tablet Take 1 tablet (500 mg total) by mouth daily. 30 tablet 0   melatonin 5 MG TABS Take 5 mg by mouth at bedtime.     methenamine (HIPREX) 1 g tablet Take 1 g by mouth every 12 (twelve) hours.  Multiple Vitamin (MULTIVITAMIN WITH MINERALS) TABS tablet Take 1 tablet by mouth daily.     Polyethyl Glycol-Propyl Glycol (SYSTANE) 0.4-0.3 % SOLN Place 2 drops into both eyes daily.     polyethylene glycol (MIRALAX / GLYCOLAX) 17 g packet Take 17 g by mouth daily.     potassium chloride (KLOR-CON M) 10 MEQ tablet Take 1 tablet (10 mEq total) by mouth daily. 30 tablet 0   senna-docusate (SENOKOT-S) 8.6-50 MG tablet Take 1 tablet by mouth at bedtime as needed.     torsemide (DEMADEX) 20 MG tablet Take 20 mg by mouth daily as needed.     No current facility-administered medications on file prior to visit.    Allergies:   Allergies  Allergen Reactions   Naproxen Hives   No [facility] Food Allergies Other (See Comments)    Scallops - distress, nausea and vomitting      OBJECTIVE:  Physical Exam  Vitals:   05/06/21 1503  BP: 113/78  Pulse: 83    There is no height or weight on file to calculate BMI. No results found.  General: well developed, well nourished, pleasant elderly Caucasian male, seated, in no evident distress Head: head normocephalic and atraumatic.   Neck: supple with no carotid or supraclavicular bruits Cardiovascular: regular rate and rhythm, no murmurs Musculoskeletal: no deformity Skin:  no rash/petichiae Vascular:  Normal pulses all extremities   Neurologic Exam Mental Status: Lethargic falling asleep easily but could be easily awoken.  Limited verbal output with hypophonia and possible underlying aphasia.  Delayed processing and responses.  Some difficulty following simple commands.  Able to state name but would not answer other orientation questions.  Would give a thumbs up or down  for yes/no questions. Recent and remote memory impaired. Attention span, concentration and fund of knowledge impaired. Mood and affect flat.  Cranial Nerves: Pupils equal, briskly reactive to light.  Difficulty assessing extraocular movements although right gaze preference noted.  Left inattention.  Does not blink to threat on left.  Hearing intact. Facial sensation intact.  Left lower facial weakness.  Tongue and palate moves normally and symmetrically.  Motor: Full strength RUE and mild right hip flexor weakness.  Spastic left hemiparesis with LUE 2-3/5 and 2/5 distal with increased tone and LLE 2/5 with increased tone Sensory.:  appears intact bilaterally  Coordination: Impaired on the left Gait and Station: Deferred Reflexes: Asymmetric and brisker on left.  Toes downgoing         ASSESSMENT: RAYDAN SCHLABACH is a 81 y.o. year old male with PMHx of atrial fibrillation, aortic valve replacement, mitral valve replacement, hx of stroke (2015, 2/2 a.fib without deficit), HTN, HLD and DM who presented on 5/22 after a fall with imaging unremarkable.  He was d/c'd and return the following day with garbled speech and difficulty moving with imaging now demonstrating very large R SDH and smaller L SDH with mass defect, subfalcine herniation and leftward midline shift with early left lateral ventricle entrapment.  Placed on comfort care due to poor prognosis but due to improvement, underwent craniotomy for evacuation of right SDH.  Warfarin placed on hold.  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 fib with interruption of AC due to recent traumatic SDH. Hospitalizations in October 2022 due to left small AICA stroke likely due to endocarditis, dehydration, hyponatremia, UTI and bacteremia endocarditis as well as in November 2022  due to acute congestive heart failure.     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  Continue OT/SLP at SNF. Wife asks about tx options for spasticity - would be hesitant to trial muscle relaxant as risk would likely outweigh benefit. Advised to discuss with PMR at follow up visit - unsure if botox would be of benefit but PMR can further weight in Continue Eliquis (apixaban) daily  and atorvastatin for secondary stroke prevention.   Discussed secondary stroke prevention measures and importance of close PCP follow up for aggressive stroke risk factor management including HTN with BP goal<130/90, HLD with LDL goal<70 and DM with A1c goal<7. I have gone over the pathophysiology of stroke, warning signs and symptoms, risk factors and their management in some detail with instructions to go to the closest emergency room for symptoms of concern. Seizures, post stroke: Continue Vimpat 50 mg twice daily for seizure prophylaxis Atrial fibrillation: Continue Eliquis 5 mg twice daily routinely followed by cardiology - wife plans on scheduling follow-up visit with established cardiologist Dr. [name] Lethargy: Prolonged discussion regarding this concern and possible factors.  Likely multifactorial with complicated medical history. Likely underlying sleep apnea. Concern patient will have difficulty tolerating sleep study and even more so treatment with CPAP if indicated but son and wife wish to proceed with evaluation. Son frustrated that they will need to establish care with one of our sleep providers prior to study and wife frustrated due to waiting for this evaluation, discussed protocol for this office. Advised if they wish, they can discuss with PCP to order a sleep test and they can treat as indicated.    Follow-up in 6 months or call earlier if needed   CC:  [facility] provider: Dr. Leonie [name] PCP: [name], MD    I spent 52  minutes of face-to-face and non-face-to-face time with patient and family.  This included previsit chart review, lab review, study review, order entry, electronic health record documentation, patient and family education and prolonged discussion regarding recurrent strokes and residual deficits, secondary stroke prevention measures and importance of managing stroke risk factors, other concerns as noted above and answered all questions to patient and family's satisfaction  Frann Rider, AGNP-BC  [facility] [address] [place], [place] [address]  Phone [phone] Fax [phone] Note: This document was prepared with digital dictation and possible smart phrase technology. Any transcriptional errors that result from this process are unintentional.

## 2021-05-06 NOTE — Patient Instructions (Signed)
Continue Eliquis (apixaban) daily  and atorvastatin 80mg  daily  for secondary stroke prevention ? ?Continue Vimpat 50mg  twice daily for seizure prevention ? ?Referral placed to [facility] for possible underlying apnea  ? ?Continue to follow up with PCP regarding cholesterol and blood pressure management  ?Maintain strict control of hypertension with blood pressure goal below 130/90 and cholesterol with LDL cholesterol (bad cholesterol) goal below 70 mg/dL.  ? ?Signs of a Stroke? Follow the BEFAST method:  ?Balance Watch for a sudden loss of balance, trouble with coordination or vertigo ?Eyes Is there a sudden loss of vision in one or both eyes? Or double vision?  ?Face: Ask the person to smile. Does one side of the face droop or is it numb?  ?Arms: Ask the person to raise both arms. Does one arm drift downward? Is there weakness or numbness of a leg? ?Speech: Ask the person to repeat a simple phrase. Does the speech sound slurred/strange? Is the person confused ? ?Time: If you observe any of these signs, call 911. ? ? ? ?Follow up in 6 months or call earlier if needed ? ? ? ?Thank you for coming to see [place] at [facility]. I hope we have been able to provide you high quality care today. ? ?You may receive a patient satisfaction survey over the next few weeks. We would appreciate your feedback and comments so that we may continue to improve ourselves and the health of our patients. ? ?

## 2021-05-07 DIAGNOSIS — R41840 Attention and concentration deficit: Secondary | ICD-10-CM | POA: Diagnosis not present

## 2021-05-07 DIAGNOSIS — R278 Other lack of coordination: Secondary | ICD-10-CM | POA: Diagnosis not present

## 2021-05-07 DIAGNOSIS — S065X3S Traumatic subdural hemorrhage with loss of consciousness of 1 hour to 5 hours 59 minutes, sequela: Secondary | ICD-10-CM | POA: Diagnosis not present

## 2021-05-07 DIAGNOSIS — R293 Abnormal posture: Secondary | ICD-10-CM | POA: Diagnosis not present

## 2021-05-07 DIAGNOSIS — G9341 Metabolic encephalopathy: Secondary | ICD-10-CM | POA: Diagnosis not present

## 2021-05-07 DIAGNOSIS — I69391 Dysphagia following cerebral infarction: Secondary | ICD-10-CM | POA: Diagnosis not present

## 2021-05-07 DIAGNOSIS — I5032 Chronic diastolic (congestive) heart failure: Secondary | ICD-10-CM | POA: Diagnosis not present

## 2021-05-07 DIAGNOSIS — I69812 Visuospatial deficit and spatial neglect following other cerebrovascular disease: Secondary | ICD-10-CM | POA: Diagnosis not present

## 2021-05-07 DIAGNOSIS — I6601 Occlusion and stenosis of right middle cerebral artery: Secondary | ICD-10-CM | POA: Diagnosis not present

## 2021-05-07 DIAGNOSIS — H538 Other visual disturbances: Secondary | ICD-10-CM | POA: Diagnosis not present

## 2021-05-07 DIAGNOSIS — M6389 Disorders of muscle in diseases classified elsewhere, multiple sites: Secondary | ICD-10-CM | POA: Diagnosis not present

## 2021-05-07 DIAGNOSIS — I69354 Hemiplegia and hemiparesis following cerebral infarction affecting left non-dominant side: Secondary | ICD-10-CM | POA: Diagnosis not present

## 2021-05-07 DIAGNOSIS — I5022 Chronic systolic (congestive) heart failure: Secondary | ICD-10-CM | POA: Diagnosis not present

## 2021-05-07 DIAGNOSIS — R41841 Cognitive communication deficit: Secondary | ICD-10-CM | POA: Diagnosis not present

## 2021-05-10 DIAGNOSIS — I38 Endocarditis, valve unspecified: Secondary | ICD-10-CM | POA: Diagnosis not present

## 2021-05-10 DIAGNOSIS — S065X3S Traumatic subdural hemorrhage with loss of consciousness of 1 hour to 5 hours 59 minutes, sequela: Secondary | ICD-10-CM | POA: Diagnosis not present

## 2021-05-10 DIAGNOSIS — I69354 Hemiplegia and hemiparesis following cerebral infarction affecting left non-dominant side: Secondary | ICD-10-CM | POA: Diagnosis not present

## 2021-05-10 DIAGNOSIS — D2271 Melanocytic nevi of right lower limb, including hip: Secondary | ICD-10-CM | POA: Diagnosis not present

## 2021-05-10 DIAGNOSIS — I6601 Occlusion and stenosis of right middle cerebral artery: Secondary | ICD-10-CM | POA: Diagnosis not present

## 2021-05-10 DIAGNOSIS — I69812 Visuospatial deficit and spatial neglect following other cerebrovascular disease: Secondary | ICD-10-CM | POA: Diagnosis not present

## 2021-05-10 DIAGNOSIS — R41840 Attention and concentration deficit: Secondary | ICD-10-CM | POA: Diagnosis not present

## 2021-05-10 DIAGNOSIS — D2272 Melanocytic nevi of left lower limb, including hip: Secondary | ICD-10-CM | POA: Diagnosis not present

## 2021-05-10 DIAGNOSIS — R41841 Cognitive communication deficit: Secondary | ICD-10-CM | POA: Diagnosis not present

## 2021-05-10 DIAGNOSIS — G9341 Metabolic encephalopathy: Secondary | ICD-10-CM | POA: Diagnosis not present

## 2021-05-10 DIAGNOSIS — R293 Abnormal posture: Secondary | ICD-10-CM | POA: Diagnosis not present

## 2021-05-10 DIAGNOSIS — D1801 Hemangioma of skin and subcutaneous tissue: Secondary | ICD-10-CM | POA: Diagnosis not present

## 2021-05-10 DIAGNOSIS — I5022 Chronic systolic (congestive) heart failure: Secondary | ICD-10-CM | POA: Diagnosis not present

## 2021-05-10 DIAGNOSIS — D225 Melanocytic nevi of trunk: Secondary | ICD-10-CM | POA: Diagnosis not present

## 2021-05-10 DIAGNOSIS — L218 Other seborrheic dermatitis: Secondary | ICD-10-CM | POA: Diagnosis not present

## 2021-05-10 DIAGNOSIS — L821 Other seborrheic keratosis: Secondary | ICD-10-CM | POA: Diagnosis not present

## 2021-05-10 DIAGNOSIS — H538 Other visual disturbances: Secondary | ICD-10-CM | POA: Diagnosis not present

## 2021-05-10 DIAGNOSIS — M6389 Disorders of muscle in diseases classified elsewhere, multiple sites: Secondary | ICD-10-CM | POA: Diagnosis not present

## 2021-05-10 DIAGNOSIS — I69391 Dysphagia following cerebral infarction: Secondary | ICD-10-CM | POA: Diagnosis not present

## 2021-05-10 DIAGNOSIS — R278 Other lack of coordination: Secondary | ICD-10-CM | POA: Diagnosis not present

## 2021-05-10 DIAGNOSIS — Z85828 Personal history of other malignant neoplasm of skin: Secondary | ICD-10-CM | POA: Diagnosis not present

## 2021-05-10 DIAGNOSIS — I5032 Chronic diastolic (congestive) heart failure: Secondary | ICD-10-CM | POA: Diagnosis not present

## 2021-05-10 LAB — BASIC METABOLIC PANEL
BUN: 14 (ref 4–21)
CO2: 23 — AB (ref 13–22)
Chloride: 105 (ref 99–108)
Creatinine: 0.6 (ref 0.6–1.3)
Glucose: 93
Potassium: 4.1 mEq/L (ref 3.5–5.1)
Sodium: 142 (ref 137–147)

## 2021-05-10 LAB — COMPREHENSIVE METABOLIC PANEL: Calcium: 9.6 (ref 8.7–10.7)

## 2021-05-11 ENCOUNTER — Other Ambulatory Visit: Payer: Self-pay

## 2021-05-11 ENCOUNTER — Encounter: Payer: Self-pay | Admitting: Internal Medicine

## 2021-05-11 ENCOUNTER — Ambulatory Visit (INDEPENDENT_AMBULATORY_CARE_PROVIDER_SITE_OTHER): Payer: Medicare Other | Admitting: Internal Medicine

## 2021-05-11 VITALS — BP 112/75 | HR 84 | Temp 97.6°F | Wt 142.0 lb

## 2021-05-11 DIAGNOSIS — H538 Other visual disturbances: Secondary | ICD-10-CM | POA: Diagnosis not present

## 2021-05-11 DIAGNOSIS — M6389 Disorders of muscle in diseases classified elsewhere, multiple sites: Secondary | ICD-10-CM | POA: Diagnosis not present

## 2021-05-11 DIAGNOSIS — T826XXD Infection and inflammatory reaction due to cardiac valve prosthesis, subsequent encounter: Secondary | ICD-10-CM

## 2021-05-11 DIAGNOSIS — I38 Endocarditis, valve unspecified: Secondary | ICD-10-CM | POA: Diagnosis not present

## 2021-05-11 DIAGNOSIS — G9341 Metabolic encephalopathy: Secondary | ICD-10-CM | POA: Diagnosis not present

## 2021-05-11 DIAGNOSIS — I69354 Hemiplegia and hemiparesis following cerebral infarction affecting left non-dominant side: Secondary | ICD-10-CM | POA: Diagnosis not present

## 2021-05-11 DIAGNOSIS — R41841 Cognitive communication deficit: Secondary | ICD-10-CM | POA: Diagnosis not present

## 2021-05-11 DIAGNOSIS — I5022 Chronic systolic (congestive) heart failure: Secondary | ICD-10-CM | POA: Diagnosis not present

## 2021-05-11 DIAGNOSIS — R278 Other lack of coordination: Secondary | ICD-10-CM | POA: Diagnosis not present

## 2021-05-11 DIAGNOSIS — I69391 Dysphagia following cerebral infarction: Secondary | ICD-10-CM | POA: Diagnosis not present

## 2021-05-11 DIAGNOSIS — I6601 Occlusion and stenosis of right middle cerebral artery: Secondary | ICD-10-CM | POA: Diagnosis not present

## 2021-05-11 DIAGNOSIS — I69812 Visuospatial deficit and spatial neglect following other cerebrovascular disease: Secondary | ICD-10-CM | POA: Diagnosis not present

## 2021-05-11 DIAGNOSIS — R293 Abnormal posture: Secondary | ICD-10-CM | POA: Diagnosis not present

## 2021-05-11 DIAGNOSIS — I5032 Chronic diastolic (congestive) heart failure: Secondary | ICD-10-CM | POA: Diagnosis not present

## 2021-05-11 DIAGNOSIS — T826XXS Infection and inflammatory reaction due to cardiac valve prosthesis, sequela: Secondary | ICD-10-CM

## 2021-05-11 DIAGNOSIS — R41840 Attention and concentration deficit: Secondary | ICD-10-CM | POA: Diagnosis not present

## 2021-05-11 DIAGNOSIS — S065X3S Traumatic subdural hemorrhage with loss of consciousness of 1 hour to 5 hours 59 minutes, sequela: Secondary | ICD-10-CM | POA: Diagnosis not present

## 2021-05-11 NOTE — Progress Notes (Signed)
Patient Active Problem List   Diagnosis Date Noted   Prolonged Q-T interval on ECG 04/30/2021   Secondary hypercoagulable state ([place]) 02/01/2021   Acute on chronic diastolic CHF (congestive heart failure) (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[place])    Acute ischemic right MCA stroke ([place]) 08/15/2020   CVA (cerebral vascular accident) ([place]) 08/07/2020   Middle cerebral artery embolism, 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   Subdural hemorrhage following injury 07/31/2020   S/P craniotomy 07/28/2020   Subdural hematoma 07/27/2020   Vertigo 08/30/2018   S/P right THA, 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   Long term (current) use of anticoagulants 01/05/2018   History of food allergy 12/11/2017   Emesis 12/11/2017   Visit for monitoring Tikosyn therapy 06/06/2017   Internal hemorrhoid, bleeding 05/19/2016   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   Aortic valve disorder 07/09/2015   Long term current use of anticoagulant therapy 01/19/2015   Atrial fibrillation, chronic ([place]) 01/20/2014   Elevated PSA 12/23/2013   Prediabetes 12/19/2013   Benign prostatic hyperplasia with urinary obstruction 10/29/2013   left frontal cerebral infarct secondary to afib 08/05/2013   Atrial flutter ([place]) 05/17/2013   Essential hypertension 06/19/2012   Routine health maintenance 04/26/2011   Persistent atrial fibrillation ([place]) 07/10/2008   Hyperlipidemia  with target LDL less than 130 07/07/2008   History of colonic polyps 06/16/2008   ERECTILE DYSFUNCTION 09/25/2007    Patient's Medications  New Prescriptions   No medications on file  Previous Medications   AMANTADINE (SYMMETREL) 100 MG CAPSULE    Take 100 mg by mouth 2 (two) times daily. Between 8-11 am and 12-3 pm   AMLODIPINE (NORVASC) 5 MG TABLET    Take 5 mg by mouth daily.   AMOXICILLIN (AMOXIL) 400 MG/5ML SUSPENSION    Take 6.3 mLs (500 mg total) by mouth 2 (two) times daily. Please flavor.  Duration to be determined by infectious disease MD during outpatient follow-up.   APIXABAN (ELIQUIS) 5 MG TABS TABLET    Take 1 tablet (5 mg total) by mouth 2 (two) times daily.   ARTIFICIAL SALIVA (BIOTENE MOISTURIZING MOUTH MT)    Use as directed 2 sprays in the mouth or throat in the morning, at noon, and at bedtime.   ATORVASTATIN (LIPITOR) 80 MG TABLET    Take 1 tablet (80 mg total) by mouth daily.   BISACODYL (DULCOLAX) 10 MG SUPPOSITORY    Place 1 suppository (10 mg total) rectally daily as needed for moderate constipation or mild constipation.   CLOBETASOL CREAM (TEMOVATE) 0.05 %    Apply 1 application topically daily as needed (for skin irritation).    DOFETILIDE (TIKOSYN) 125 MCG CAPSULE    Take 3 capsules (375 mcg total) by mouth 2 (two) times daily.   FAMOTIDINE (PEPCID) 20 MG TABLET    Take 20 mg by mouth  at bedtime as needed for indigestion.   FINASTERIDE (PROSCAR) 5 MG TABLET    Take 1 tablet (5 mg total) by mouth daily.   GLUCERNA (GLUCERNA) LIQD    Take 237 mLs by mouth daily.   HYDROCORTISONE CREAM 1 %    Apply topically 2 (two) times daily as needed for itching (rash).   LACOSAMIDE (VIMPAT) 50 MG TABS TABLET    Take 1 tablet (50 mg total) by mouth 2 (two) times daily.   LEVALBUTEROL (XOPENEX) 0.63 MG/3ML NEBULIZER SOLUTION    Take 0.63 mg by nebulization every 6 (six) hours as needed for wheezing or shortness of breath.   MAGNESIUM GLUCONATE (MAGONATE) 500 MG TABLET    Take 1  tablet (500 mg total) by mouth daily.   MELATONIN 5 MG TABS    Take 5 mg by mouth at bedtime.   METHENAMINE (HIPREX) 1 G TABLET    Take 1 g by mouth every 12 (twelve) hours.   MULTIPLE VITAMIN (MULTIVITAMIN WITH MINERALS) TABS TABLET    Take 1 tablet by mouth daily.   POLYETHYL GLYCOL-PROPYL GLYCOL (SYSTANE) 0.4-0.3 % SOLN    Place 2 drops into both eyes daily.   POLYETHYLENE GLYCOL (MIRALAX / GLYCOLAX) 17 G PACKET    Take 17 g by mouth daily.   POTASSIUM CHLORIDE (KLOR-CON M) 10 MEQ TABLET    Take 1 tablet (10 mEq total) by mouth daily.   SENNA-DOCUSATE (SENOKOT-S) 8.6-50 MG TABLET    Take 1 tablet by mouth at bedtime as needed.   TORSEMIDE (DEMADEX) 20 MG TABLET    Take 20 mg by mouth daily as needed.  Modified Medications   No medications on file  Discontinued Medications   No medications on file    Subjective: [name] [name] is a 81 y.o. 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 04/19/19 he presents with his wife. Pt lives at [facility]. Wife reports that he is able to tolerate the amoxicillin. She believes if pt's PO intake does not improve, peg tube may be the next step. Interim: Seen by Cariology on 1/25, noted to have vegetation still present on mitral valve. Blood Cx NG on 1/25. No plan to change management. Today 05/11/21: Wife states pt is still not interacting very much. Other wise no other complaints.  Review of Systems: Review of Systems  All other systems reviewed and are negative.  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    Other and unspecified hyperlipidemia    Pre-diabetes    Previous back surgery 1978, jan 2007   Psychosexual dysfunction with inhibited sexual excitement    SOB (shortness of breath)    "  with heavy exercise"   Stroke ([place]) 08/2013   "I WAS IN AFIB AND THREW A CLOT, THE EFFECTS WERE TRANSITORY AND DIDNT LAST BUT FOR 55 MINUTES"    Thoracic aortic aneurysm     Social History   Tobacco Use   Smoking status: Never   Smokeless tobacco: Never  Vaping Use   Vaping Use: Never used  Substance Use Topics   Alcohol use: Never    Comment: wine occasionally   Drug use: Never    Family History  Problem Relation Age of Onset   Macular degeneration Mother        macular degeneration   Cancer Father        intestinal/GI   Diabetes Paternal Aunt    Heart attack Maternal Grandfather    Diabetes Brother     Allergies  Allergen Reactions   Naproxen Hives   No [facility] Food Allergies Other (See Comments)    Scallops - distress, nausea and vomitting    Health Maintenance  Topic Date Due   FOOT EXAM  Never done   OPHTHALMOLOGY EXAM  Never done   URINE MICROALBUMIN  05/13/2021 (Originally 10/04/1950)   COVID-19 Vaccine (3 - Moderna risk series) 07/23/2021 (Originally 01/12/2021)   HEMOGLOBIN A1C  06/17/2021   TETANUS/TDAP  03/22/2028   Pneumonia Vaccine 39+ Years old  Completed   INFLUENZA VACCINE  Completed   Zoster Vaccines- Shingrix  Completed   HPV VACCINES  Aged Out   COLONOSCOPY (Pts 45-4yrs Insurance coverage will need to be confirmed)  Discontinued   Hepatitis C Screening  Discontinued    Objective:  Vitals:   05/11/21 1050  BP: 112/75   Pulse: 84  Temp: 97.6 F (36.4 C)  TempSrc: Oral  SpO2: 98%  Weight: 142 lb (64.4 kg)   Body mass index is 22.24 kg/m.  Physical Exam Constitutional:      General: He is not in acute distress.    Appearance: He is normal weight.     Comments: In a wheelchair  HENT:     Head: Normocephalic and atraumatic.     Right Ear: External ear normal.     Left Ear: External ear normal.     Nose: No congestion or rhinorrhea.     Mouth/Throat:     Mouth: Mucous membranes are moist.     Pharynx: Oropharynx is clear.  Eyes:     Extraocular Movements: Extraocular movements intact.     Conjunctiva/sclera: Conjunctivae normal.     Pupils: Pupils are equal, round, and reactive to light.  Cardiovascular:     Rate and Rhythm: Normal rate and regular rhythm.     Heart sounds: No murmur heard.   No friction rub. No gallop.  Pulmonary:     Effort: Pulmonary effort is normal.     Breath sounds: Normal breath sounds.  Abdominal:     General: Abdomen is flat. Bowel sounds are normal.     Palpations: Abdomen is soft.  Musculoskeletal:        General: No swelling. Normal range of motion.     Cervical back: Normal range of motion and neck supple.  Skin:    General: Skin is warm and dry.    Lab Results Lab Results  Component Value Date   WBC 10.3 04/19/2021   HGB 10.3 (A) 04/19/2021   HCT 30 (A) 04/19/2021   MCV 85.0 02/05/2021   PLT 282 04/19/2021    Lab Results  Component Value Date   CREATININE 0.5 (  A) 05/03/2021   BUN 11 05/03/2021   NA 139 05/03/2021   K 3.8 05/03/2021   CL 104 05/03/2021   CO2 25 (A) 05/03/2021    Lab Results  Component Value Date   ALT 20 04/19/2021   AST 23 04/19/2021   ALKPHOS 175 (A) 04/19/2021   BILITOT 0.7 01/23/2021    Lab Results  Component Value Date   CHOL 119 08/08/2020   HDL 42 08/08/2020   LDLCALC 56 08/08/2020   TRIG 105 08/08/2020   CHOLHDL 2.8 08/08/2020   No results found for: LABRPR, RPRTITER No results found for: HIV1RNAQUANT,  HIV1RNAVL, CD4TABS   A/P #E 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 02/24/21, 03/31/21 surveillance blood Cx negative -Reached out to Dr. [name] today and there was vegetation noted on TTE on 1/25. 1/25 blood Cx negative. As such will plan to continue suppressive antibiotics. Vegetation likely represent sterile mass, will continue surveillance blood Cx.  Plan: -Labs today, cbc. Cmp, esr, crp, blood Cx x2 -Continue  suppressive amoxicillin -Follow with Cardiology(4/25) -Follow-up in 2 months   Laurice Record, MD [place] [facility] 05/11/2021, 11:00 AM

## 2021-05-12 DIAGNOSIS — I5032 Chronic diastolic (congestive) heart failure: Secondary | ICD-10-CM | POA: Diagnosis not present

## 2021-05-12 DIAGNOSIS — R41840 Attention and concentration deficit: Secondary | ICD-10-CM | POA: Diagnosis not present

## 2021-05-12 DIAGNOSIS — I69812 Visuospatial deficit and spatial neglect following other cerebrovascular disease: Secondary | ICD-10-CM | POA: Diagnosis not present

## 2021-05-12 DIAGNOSIS — S065X3S Traumatic subdural hemorrhage with loss of consciousness of 1 hour to 5 hours 59 minutes, sequela: Secondary | ICD-10-CM | POA: Diagnosis not present

## 2021-05-12 DIAGNOSIS — R278 Other lack of coordination: Secondary | ICD-10-CM | POA: Diagnosis not present

## 2021-05-12 DIAGNOSIS — R41841 Cognitive communication deficit: Secondary | ICD-10-CM | POA: Diagnosis not present

## 2021-05-12 DIAGNOSIS — G9341 Metabolic encephalopathy: Secondary | ICD-10-CM | POA: Diagnosis not present

## 2021-05-12 DIAGNOSIS — R293 Abnormal posture: Secondary | ICD-10-CM | POA: Diagnosis not present

## 2021-05-12 DIAGNOSIS — I69391 Dysphagia following cerebral infarction: Secondary | ICD-10-CM | POA: Diagnosis not present

## 2021-05-12 DIAGNOSIS — I69354 Hemiplegia and hemiparesis following cerebral infarction affecting left non-dominant side: Secondary | ICD-10-CM | POA: Diagnosis not present

## 2021-05-12 DIAGNOSIS — M6389 Disorders of muscle in diseases classified elsewhere, multiple sites: Secondary | ICD-10-CM | POA: Diagnosis not present

## 2021-05-12 DIAGNOSIS — I6601 Occlusion and stenosis of right middle cerebral artery: Secondary | ICD-10-CM | POA: Diagnosis not present

## 2021-05-12 DIAGNOSIS — H538 Other visual disturbances: Secondary | ICD-10-CM | POA: Diagnosis not present

## 2021-05-12 DIAGNOSIS — I5022 Chronic systolic (congestive) heart failure: Secondary | ICD-10-CM | POA: Diagnosis not present

## 2021-05-13 DIAGNOSIS — I69812 Visuospatial deficit and spatial neglect following other cerebrovascular disease: Secondary | ICD-10-CM | POA: Diagnosis not present

## 2021-05-13 DIAGNOSIS — I69354 Hemiplegia and hemiparesis following cerebral infarction affecting left non-dominant side: Secondary | ICD-10-CM | POA: Diagnosis not present

## 2021-05-13 DIAGNOSIS — I5032 Chronic diastolic (congestive) heart failure: Secondary | ICD-10-CM | POA: Diagnosis not present

## 2021-05-13 DIAGNOSIS — R293 Abnormal posture: Secondary | ICD-10-CM | POA: Diagnosis not present

## 2021-05-13 DIAGNOSIS — S065X3S Traumatic subdural hemorrhage with loss of consciousness of 1 hour to 5 hours 59 minutes, sequela: Secondary | ICD-10-CM | POA: Diagnosis not present

## 2021-05-13 DIAGNOSIS — R41840 Attention and concentration deficit: Secondary | ICD-10-CM | POA: Diagnosis not present

## 2021-05-13 DIAGNOSIS — R278 Other lack of coordination: Secondary | ICD-10-CM | POA: Diagnosis not present

## 2021-05-13 DIAGNOSIS — I5022 Chronic systolic (congestive) heart failure: Secondary | ICD-10-CM | POA: Diagnosis not present

## 2021-05-13 DIAGNOSIS — M6389 Disorders of muscle in diseases classified elsewhere, multiple sites: Secondary | ICD-10-CM | POA: Diagnosis not present

## 2021-05-13 DIAGNOSIS — G9341 Metabolic encephalopathy: Secondary | ICD-10-CM | POA: Diagnosis not present

## 2021-05-13 DIAGNOSIS — H538 Other visual disturbances: Secondary | ICD-10-CM | POA: Diagnosis not present

## 2021-05-13 DIAGNOSIS — I6601 Occlusion and stenosis of right middle cerebral artery: Secondary | ICD-10-CM | POA: Diagnosis not present

## 2021-05-13 DIAGNOSIS — R41841 Cognitive communication deficit: Secondary | ICD-10-CM | POA: Diagnosis not present

## 2021-05-13 DIAGNOSIS — I69391 Dysphagia following cerebral infarction: Secondary | ICD-10-CM | POA: Diagnosis not present

## 2021-05-14 DIAGNOSIS — I69812 Visuospatial deficit and spatial neglect following other cerebrovascular disease: Secondary | ICD-10-CM | POA: Diagnosis not present

## 2021-05-14 DIAGNOSIS — H538 Other visual disturbances: Secondary | ICD-10-CM | POA: Diagnosis not present

## 2021-05-14 DIAGNOSIS — G9341 Metabolic encephalopathy: Secondary | ICD-10-CM | POA: Diagnosis not present

## 2021-05-14 DIAGNOSIS — I6601 Occlusion and stenosis of right middle cerebral artery: Secondary | ICD-10-CM | POA: Diagnosis not present

## 2021-05-14 DIAGNOSIS — M6389 Disorders of muscle in diseases classified elsewhere, multiple sites: Secondary | ICD-10-CM | POA: Diagnosis not present

## 2021-05-14 DIAGNOSIS — R293 Abnormal posture: Secondary | ICD-10-CM | POA: Diagnosis not present

## 2021-05-14 DIAGNOSIS — S065X3S Traumatic subdural hemorrhage with loss of consciousness of 1 hour to 5 hours 59 minutes, sequela: Secondary | ICD-10-CM | POA: Diagnosis not present

## 2021-05-14 DIAGNOSIS — I5022 Chronic systolic (congestive) heart failure: Secondary | ICD-10-CM | POA: Diagnosis not present

## 2021-05-14 DIAGNOSIS — R41840 Attention and concentration deficit: Secondary | ICD-10-CM | POA: Diagnosis not present

## 2021-05-14 DIAGNOSIS — R278 Other lack of coordination: Secondary | ICD-10-CM | POA: Diagnosis not present

## 2021-05-17 DIAGNOSIS — I69812 Visuospatial deficit and spatial neglect following other cerebrovascular disease: Secondary | ICD-10-CM | POA: Diagnosis not present

## 2021-05-17 DIAGNOSIS — H538 Other visual disturbances: Secondary | ICD-10-CM | POA: Diagnosis not present

## 2021-05-17 DIAGNOSIS — R278 Other lack of coordination: Secondary | ICD-10-CM | POA: Diagnosis not present

## 2021-05-17 DIAGNOSIS — S065X3S Traumatic subdural hemorrhage with loss of consciousness of 1 hour to 5 hours 59 minutes, sequela: Secondary | ICD-10-CM | POA: Diagnosis not present

## 2021-05-17 DIAGNOSIS — G9341 Metabolic encephalopathy: Secondary | ICD-10-CM | POA: Diagnosis not present

## 2021-05-17 DIAGNOSIS — I5022 Chronic systolic (congestive) heart failure: Secondary | ICD-10-CM | POA: Diagnosis not present

## 2021-05-17 DIAGNOSIS — I6601 Occlusion and stenosis of right middle cerebral artery: Secondary | ICD-10-CM | POA: Diagnosis not present

## 2021-05-17 DIAGNOSIS — R41840 Attention and concentration deficit: Secondary | ICD-10-CM | POA: Diagnosis not present

## 2021-05-17 DIAGNOSIS — R293 Abnormal posture: Secondary | ICD-10-CM | POA: Diagnosis not present

## 2021-05-17 DIAGNOSIS — M6389 Disorders of muscle in diseases classified elsewhere, multiple sites: Secondary | ICD-10-CM | POA: Diagnosis not present

## 2021-05-17 LAB — CBC WITH DIFFERENTIAL/PLATELET
Absolute Monocytes: 631 cells/uL (ref 200–950)
Basophils Absolute: 66 cells/uL (ref 0–200)
Basophils Relative: 0.8 %
Eosinophils Absolute: 299 cells/uL (ref 15–500)
Eosinophils Relative: 3.6 %
HCT: 35.6 % — ABNORMAL LOW (ref 38.5–50.0)
Hemoglobin: 11.4 g/dL — ABNORMAL LOW (ref 13.2–17.1)
Lymphs Abs: 1461 cells/uL (ref 850–3900)
MCH: 27.5 pg (ref 27.0–33.0)
MCHC: 32 g/dL (ref 32.0–36.0)
MCV: 86 fL (ref 80.0–100.0)
MPV: 11.8 fL (ref 7.5–12.5)
Monocytes Relative: 7.6 %
Neutro Abs: 5843 cells/uL (ref 1500–7800)
Neutrophils Relative %: 70.4 %
Platelets: 295 10*3/uL (ref 140–400)
RBC: 4.14 10*6/uL — ABNORMAL LOW (ref 4.20–5.80)
RDW: 16 % — ABNORMAL HIGH (ref 11.0–15.0)
Total Lymphocyte: 17.6 %
WBC: 8.3 10*3/uL (ref 3.8–10.8)

## 2021-05-17 LAB — CULTURE, BLOOD (SINGLE)
MICRO NUMBER:: 13101767
Result:: NO GROWTH
SPECIMEN QUALITY:: ADEQUATE

## 2021-05-17 LAB — COMPLETE METABOLIC PANEL WITH GFR
AG Ratio: 1.4 (calc) (ref 1.0–2.5)
ALT: 21 U/L (ref 9–46)
AST: 23 U/L (ref 10–35)
Albumin: 4.1 g/dL (ref 3.6–5.1)
Alkaline phosphatase (APISO): 153 U/L — ABNORMAL HIGH (ref 35–144)
BUN: 18 mg/dL (ref 7–25)
CO2: 26 mmol/L (ref 20–32)
Calcium: 9.3 mg/dL (ref 8.6–10.3)
Chloride: 103 mmol/L (ref 98–110)
Creat: 0.83 mg/dL (ref 0.70–1.28)
Globulin: 3 g/dL (calc) (ref 1.9–3.7)
Glucose, Bld: 101 mg/dL — ABNORMAL HIGH (ref 65–99)
Potassium: 4.1 mmol/L (ref 3.5–5.3)
Sodium: 139 mmol/L (ref 135–146)
Total Bilirubin: 0.7 mg/dL (ref 0.2–1.2)
Total Protein: 7.1 g/dL (ref 6.1–8.1)
eGFR: 90 mL/min/{1.73_m2} (ref >=60)

## 2021-05-17 LAB — C-REACTIVE PROTEIN: CRP: 2.7 mg/L (ref <8.0)

## 2021-05-17 LAB — SEDIMENTATION RATE: Sed Rate: 11 mm/h (ref 0–20)

## 2021-05-18 DIAGNOSIS — R41840 Attention and concentration deficit: Secondary | ICD-10-CM | POA: Diagnosis not present

## 2021-05-18 DIAGNOSIS — M6389 Disorders of muscle in diseases classified elsewhere, multiple sites: Secondary | ICD-10-CM | POA: Diagnosis not present

## 2021-05-18 DIAGNOSIS — S065X3S Traumatic subdural hemorrhage with loss of consciousness of 1 hour to 5 hours 59 minutes, sequela: Secondary | ICD-10-CM | POA: Diagnosis not present

## 2021-05-18 DIAGNOSIS — R278 Other lack of coordination: Secondary | ICD-10-CM | POA: Diagnosis not present

## 2021-05-18 DIAGNOSIS — H538 Other visual disturbances: Secondary | ICD-10-CM | POA: Diagnosis not present

## 2021-05-18 DIAGNOSIS — R293 Abnormal posture: Secondary | ICD-10-CM | POA: Diagnosis not present

## 2021-05-18 DIAGNOSIS — I69812 Visuospatial deficit and spatial neglect following other cerebrovascular disease: Secondary | ICD-10-CM | POA: Diagnosis not present

## 2021-05-18 DIAGNOSIS — I38 Endocarditis, valve unspecified: Secondary | ICD-10-CM | POA: Diagnosis not present

## 2021-05-18 DIAGNOSIS — I5022 Chronic systolic (congestive) heart failure: Secondary | ICD-10-CM | POA: Diagnosis not present

## 2021-05-18 DIAGNOSIS — I6601 Occlusion and stenosis of right middle cerebral artery: Secondary | ICD-10-CM | POA: Diagnosis not present

## 2021-05-18 DIAGNOSIS — G9341 Metabolic encephalopathy: Secondary | ICD-10-CM | POA: Diagnosis not present

## 2021-05-18 LAB — BASIC METABOLIC PANEL
BUN: 16 (ref 4–21)
CO2: 24 — AB (ref 13–22)
Chloride: 104 (ref 99–108)
Creatinine: 0.6 (ref 0.6–1.3)
Glucose: 91
Potassium: 4 mEq/L (ref 3.5–5.1)
Sodium: 139 (ref 137–147)

## 2021-05-18 LAB — COMPREHENSIVE METABOLIC PANEL: Calcium: 9.8 (ref 8.7–10.7)

## 2021-05-19 DIAGNOSIS — R293 Abnormal posture: Secondary | ICD-10-CM | POA: Diagnosis not present

## 2021-05-19 DIAGNOSIS — I69391 Dysphagia following cerebral infarction: Secondary | ICD-10-CM | POA: Diagnosis not present

## 2021-05-19 DIAGNOSIS — I5022 Chronic systolic (congestive) heart failure: Secondary | ICD-10-CM | POA: Diagnosis not present

## 2021-05-19 DIAGNOSIS — I5032 Chronic diastolic (congestive) heart failure: Secondary | ICD-10-CM | POA: Diagnosis not present

## 2021-05-19 DIAGNOSIS — S065X3S Traumatic subdural hemorrhage with loss of consciousness of 1 hour to 5 hours 59 minutes, sequela: Secondary | ICD-10-CM | POA: Diagnosis not present

## 2021-05-19 DIAGNOSIS — M6389 Disorders of muscle in diseases classified elsewhere, multiple sites: Secondary | ICD-10-CM | POA: Diagnosis not present

## 2021-05-19 DIAGNOSIS — R41840 Attention and concentration deficit: Secondary | ICD-10-CM | POA: Diagnosis not present

## 2021-05-19 DIAGNOSIS — R278 Other lack of coordination: Secondary | ICD-10-CM | POA: Diagnosis not present

## 2021-05-19 DIAGNOSIS — I6601 Occlusion and stenosis of right middle cerebral artery: Secondary | ICD-10-CM | POA: Diagnosis not present

## 2021-05-19 DIAGNOSIS — H538 Other visual disturbances: Secondary | ICD-10-CM | POA: Diagnosis not present

## 2021-05-19 DIAGNOSIS — G9341 Metabolic encephalopathy: Secondary | ICD-10-CM | POA: Diagnosis not present

## 2021-05-19 DIAGNOSIS — R41841 Cognitive communication deficit: Secondary | ICD-10-CM | POA: Diagnosis not present

## 2021-05-19 DIAGNOSIS — I69812 Visuospatial deficit and spatial neglect following other cerebrovascular disease: Secondary | ICD-10-CM | POA: Diagnosis not present

## 2021-05-19 DIAGNOSIS — I69354 Hemiplegia and hemiparesis following cerebral infarction affecting left non-dominant side: Secondary | ICD-10-CM | POA: Diagnosis not present

## 2021-05-20 DIAGNOSIS — R293 Abnormal posture: Secondary | ICD-10-CM | POA: Diagnosis not present

## 2021-05-20 DIAGNOSIS — M6389 Disorders of muscle in diseases classified elsewhere, multiple sites: Secondary | ICD-10-CM | POA: Diagnosis not present

## 2021-05-20 DIAGNOSIS — I69812 Visuospatial deficit and spatial neglect following other cerebrovascular disease: Secondary | ICD-10-CM | POA: Diagnosis not present

## 2021-05-20 DIAGNOSIS — I5022 Chronic systolic (congestive) heart failure: Secondary | ICD-10-CM | POA: Diagnosis not present

## 2021-05-20 DIAGNOSIS — I69391 Dysphagia following cerebral infarction: Secondary | ICD-10-CM | POA: Diagnosis not present

## 2021-05-20 DIAGNOSIS — R278 Other lack of coordination: Secondary | ICD-10-CM | POA: Diagnosis not present

## 2021-05-20 DIAGNOSIS — R41841 Cognitive communication deficit: Secondary | ICD-10-CM | POA: Diagnosis not present

## 2021-05-20 DIAGNOSIS — G9341 Metabolic encephalopathy: Secondary | ICD-10-CM | POA: Diagnosis not present

## 2021-05-20 DIAGNOSIS — H538 Other visual disturbances: Secondary | ICD-10-CM | POA: Diagnosis not present

## 2021-05-20 DIAGNOSIS — I5032 Chronic diastolic (congestive) heart failure: Secondary | ICD-10-CM | POA: Diagnosis not present

## 2021-05-20 DIAGNOSIS — I69354 Hemiplegia and hemiparesis following cerebral infarction affecting left non-dominant side: Secondary | ICD-10-CM | POA: Diagnosis not present

## 2021-05-20 DIAGNOSIS — R41840 Attention and concentration deficit: Secondary | ICD-10-CM | POA: Diagnosis not present

## 2021-05-20 DIAGNOSIS — S065X3S Traumatic subdural hemorrhage with loss of consciousness of 1 hour to 5 hours 59 minutes, sequela: Secondary | ICD-10-CM | POA: Diagnosis not present

## 2021-05-20 DIAGNOSIS — I6601 Occlusion and stenosis of right middle cerebral artery: Secondary | ICD-10-CM | POA: Diagnosis not present

## 2021-05-21 DIAGNOSIS — S065X3S Traumatic subdural hemorrhage with loss of consciousness of 1 hour to 5 hours 59 minutes, sequela: Secondary | ICD-10-CM | POA: Diagnosis not present

## 2021-05-21 DIAGNOSIS — I69391 Dysphagia following cerebral infarction: Secondary | ICD-10-CM | POA: Diagnosis not present

## 2021-05-21 DIAGNOSIS — R41840 Attention and concentration deficit: Secondary | ICD-10-CM | POA: Diagnosis not present

## 2021-05-21 DIAGNOSIS — G9341 Metabolic encephalopathy: Secondary | ICD-10-CM | POA: Diagnosis not present

## 2021-05-21 DIAGNOSIS — I6601 Occlusion and stenosis of right middle cerebral artery: Secondary | ICD-10-CM | POA: Diagnosis not present

## 2021-05-21 DIAGNOSIS — M6389 Disorders of muscle in diseases classified elsewhere, multiple sites: Secondary | ICD-10-CM | POA: Diagnosis not present

## 2021-05-21 DIAGNOSIS — I69812 Visuospatial deficit and spatial neglect following other cerebrovascular disease: Secondary | ICD-10-CM | POA: Diagnosis not present

## 2021-05-21 DIAGNOSIS — R278 Other lack of coordination: Secondary | ICD-10-CM | POA: Diagnosis not present

## 2021-05-21 DIAGNOSIS — H538 Other visual disturbances: Secondary | ICD-10-CM | POA: Diagnosis not present

## 2021-05-21 DIAGNOSIS — I5022 Chronic systolic (congestive) heart failure: Secondary | ICD-10-CM | POA: Diagnosis not present

## 2021-05-21 DIAGNOSIS — R41841 Cognitive communication deficit: Secondary | ICD-10-CM | POA: Diagnosis not present

## 2021-05-21 DIAGNOSIS — I5032 Chronic diastolic (congestive) heart failure: Secondary | ICD-10-CM | POA: Diagnosis not present

## 2021-05-21 DIAGNOSIS — I69354 Hemiplegia and hemiparesis following cerebral infarction affecting left non-dominant side: Secondary | ICD-10-CM | POA: Diagnosis not present

## 2021-05-21 DIAGNOSIS — R293 Abnormal posture: Secondary | ICD-10-CM | POA: Diagnosis not present

## 2021-05-24 DIAGNOSIS — R41840 Attention and concentration deficit: Secondary | ICD-10-CM | POA: Diagnosis not present

## 2021-05-24 DIAGNOSIS — H538 Other visual disturbances: Secondary | ICD-10-CM | POA: Diagnosis not present

## 2021-05-24 DIAGNOSIS — G9341 Metabolic encephalopathy: Secondary | ICD-10-CM | POA: Diagnosis not present

## 2021-05-24 DIAGNOSIS — E870 Hyperosmolality and hypernatremia: Secondary | ICD-10-CM | POA: Diagnosis not present

## 2021-05-24 DIAGNOSIS — I69812 Visuospatial deficit and spatial neglect following other cerebrovascular disease: Secondary | ICD-10-CM | POA: Diagnosis not present

## 2021-05-24 DIAGNOSIS — S065X3S Traumatic subdural hemorrhage with loss of consciousness of 1 hour to 5 hours 59 minutes, sequela: Secondary | ICD-10-CM | POA: Diagnosis not present

## 2021-05-24 DIAGNOSIS — I5032 Chronic diastolic (congestive) heart failure: Secondary | ICD-10-CM | POA: Diagnosis not present

## 2021-05-24 DIAGNOSIS — M6389 Disorders of muscle in diseases classified elsewhere, multiple sites: Secondary | ICD-10-CM | POA: Diagnosis not present

## 2021-05-24 DIAGNOSIS — I6601 Occlusion and stenosis of right middle cerebral artery: Secondary | ICD-10-CM | POA: Diagnosis not present

## 2021-05-24 DIAGNOSIS — R41841 Cognitive communication deficit: Secondary | ICD-10-CM | POA: Diagnosis not present

## 2021-05-24 DIAGNOSIS — I5022 Chronic systolic (congestive) heart failure: Secondary | ICD-10-CM | POA: Diagnosis not present

## 2021-05-24 DIAGNOSIS — R278 Other lack of coordination: Secondary | ICD-10-CM | POA: Diagnosis not present

## 2021-05-24 DIAGNOSIS — R293 Abnormal posture: Secondary | ICD-10-CM | POA: Diagnosis not present

## 2021-05-24 DIAGNOSIS — I69391 Dysphagia following cerebral infarction: Secondary | ICD-10-CM | POA: Diagnosis not present

## 2021-05-24 DIAGNOSIS — I69354 Hemiplegia and hemiparesis following cerebral infarction affecting left non-dominant side: Secondary | ICD-10-CM | POA: Diagnosis not present

## 2021-05-24 LAB — BASIC METABOLIC PANEL
BUN: 17 (ref 4–21)
CO2: 25 — AB (ref 13–22)
Chloride: 107 (ref 99–108)
Creatinine: 0.6 (ref 0.6–1.3)
Glucose: 94
Potassium: 3.9 mEq/L (ref 3.5–5.1)
Sodium: 143 (ref 137–147)

## 2021-05-24 LAB — COMPREHENSIVE METABOLIC PANEL: Calcium: 9.5 (ref 8.7–10.7)

## 2021-05-25 DIAGNOSIS — R41841 Cognitive communication deficit: Secondary | ICD-10-CM | POA: Diagnosis not present

## 2021-05-25 DIAGNOSIS — G9341 Metabolic encephalopathy: Secondary | ICD-10-CM | POA: Diagnosis not present

## 2021-05-25 DIAGNOSIS — R293 Abnormal posture: Secondary | ICD-10-CM | POA: Diagnosis not present

## 2021-05-25 DIAGNOSIS — I6601 Occlusion and stenosis of right middle cerebral artery: Secondary | ICD-10-CM | POA: Diagnosis not present

## 2021-05-25 DIAGNOSIS — S065X3S Traumatic subdural hemorrhage with loss of consciousness of 1 hour to 5 hours 59 minutes, sequela: Secondary | ICD-10-CM | POA: Diagnosis not present

## 2021-05-25 DIAGNOSIS — I69391 Dysphagia following cerebral infarction: Secondary | ICD-10-CM | POA: Diagnosis not present

## 2021-05-25 DIAGNOSIS — I69812 Visuospatial deficit and spatial neglect following other cerebrovascular disease: Secondary | ICD-10-CM | POA: Diagnosis not present

## 2021-05-25 DIAGNOSIS — I5022 Chronic systolic (congestive) heart failure: Secondary | ICD-10-CM | POA: Diagnosis not present

## 2021-05-25 DIAGNOSIS — I5032 Chronic diastolic (congestive) heart failure: Secondary | ICD-10-CM | POA: Diagnosis not present

## 2021-05-25 DIAGNOSIS — I69354 Hemiplegia and hemiparesis following cerebral infarction affecting left non-dominant side: Secondary | ICD-10-CM | POA: Diagnosis not present

## 2021-05-25 DIAGNOSIS — H538 Other visual disturbances: Secondary | ICD-10-CM | POA: Diagnosis not present

## 2021-05-25 DIAGNOSIS — M6389 Disorders of muscle in diseases classified elsewhere, multiple sites: Secondary | ICD-10-CM | POA: Diagnosis not present

## 2021-05-25 DIAGNOSIS — R278 Other lack of coordination: Secondary | ICD-10-CM | POA: Diagnosis not present

## 2021-05-25 DIAGNOSIS — R41840 Attention and concentration deficit: Secondary | ICD-10-CM | POA: Diagnosis not present

## 2021-05-26 DIAGNOSIS — I5032 Chronic diastolic (congestive) heart failure: Secondary | ICD-10-CM | POA: Diagnosis not present

## 2021-05-26 DIAGNOSIS — I6601 Occlusion and stenosis of right middle cerebral artery: Secondary | ICD-10-CM | POA: Diagnosis not present

## 2021-05-26 DIAGNOSIS — I69354 Hemiplegia and hemiparesis following cerebral infarction affecting left non-dominant side: Secondary | ICD-10-CM | POA: Diagnosis not present

## 2021-05-26 DIAGNOSIS — I69391 Dysphagia following cerebral infarction: Secondary | ICD-10-CM | POA: Diagnosis not present

## 2021-05-26 DIAGNOSIS — R41841 Cognitive communication deficit: Secondary | ICD-10-CM | POA: Diagnosis not present

## 2021-05-27 DIAGNOSIS — I69812 Visuospatial deficit and spatial neglect following other cerebrovascular disease: Secondary | ICD-10-CM | POA: Diagnosis not present

## 2021-05-27 DIAGNOSIS — M6389 Disorders of muscle in diseases classified elsewhere, multiple sites: Secondary | ICD-10-CM | POA: Diagnosis not present

## 2021-05-27 DIAGNOSIS — I5022 Chronic systolic (congestive) heart failure: Secondary | ICD-10-CM | POA: Diagnosis not present

## 2021-05-27 DIAGNOSIS — R293 Abnormal posture: Secondary | ICD-10-CM | POA: Diagnosis not present

## 2021-05-27 DIAGNOSIS — R278 Other lack of coordination: Secondary | ICD-10-CM | POA: Diagnosis not present

## 2021-05-27 DIAGNOSIS — I69354 Hemiplegia and hemiparesis following cerebral infarction affecting left non-dominant side: Secondary | ICD-10-CM | POA: Diagnosis not present

## 2021-05-27 DIAGNOSIS — I69391 Dysphagia following cerebral infarction: Secondary | ICD-10-CM | POA: Diagnosis not present

## 2021-05-27 DIAGNOSIS — I5032 Chronic diastolic (congestive) heart failure: Secondary | ICD-10-CM | POA: Diagnosis not present

## 2021-05-27 DIAGNOSIS — G9341 Metabolic encephalopathy: Secondary | ICD-10-CM | POA: Diagnosis not present

## 2021-05-27 DIAGNOSIS — H538 Other visual disturbances: Secondary | ICD-10-CM | POA: Diagnosis not present

## 2021-05-27 DIAGNOSIS — R41840 Attention and concentration deficit: Secondary | ICD-10-CM | POA: Diagnosis not present

## 2021-05-27 DIAGNOSIS — S065X3S Traumatic subdural hemorrhage with loss of consciousness of 1 hour to 5 hours 59 minutes, sequela: Secondary | ICD-10-CM | POA: Diagnosis not present

## 2021-05-27 DIAGNOSIS — R41841 Cognitive communication deficit: Secondary | ICD-10-CM | POA: Diagnosis not present

## 2021-05-27 DIAGNOSIS — I6601 Occlusion and stenosis of right middle cerebral artery: Secondary | ICD-10-CM | POA: Diagnosis not present

## 2021-05-28 DIAGNOSIS — R41840 Attention and concentration deficit: Secondary | ICD-10-CM | POA: Diagnosis not present

## 2021-05-28 DIAGNOSIS — M6389 Disorders of muscle in diseases classified elsewhere, multiple sites: Secondary | ICD-10-CM | POA: Diagnosis not present

## 2021-05-28 DIAGNOSIS — R293 Abnormal posture: Secondary | ICD-10-CM | POA: Diagnosis not present

## 2021-05-28 DIAGNOSIS — I5022 Chronic systolic (congestive) heart failure: Secondary | ICD-10-CM | POA: Diagnosis not present

## 2021-05-28 DIAGNOSIS — H538 Other visual disturbances: Secondary | ICD-10-CM | POA: Diagnosis not present

## 2021-05-28 DIAGNOSIS — I69354 Hemiplegia and hemiparesis following cerebral infarction affecting left non-dominant side: Secondary | ICD-10-CM | POA: Diagnosis not present

## 2021-05-28 DIAGNOSIS — I69391 Dysphagia following cerebral infarction: Secondary | ICD-10-CM | POA: Diagnosis not present

## 2021-05-28 DIAGNOSIS — S065X3S Traumatic subdural hemorrhage with loss of consciousness of 1 hour to 5 hours 59 minutes, sequela: Secondary | ICD-10-CM | POA: Diagnosis not present

## 2021-05-28 DIAGNOSIS — G9341 Metabolic encephalopathy: Secondary | ICD-10-CM | POA: Diagnosis not present

## 2021-05-28 DIAGNOSIS — I6601 Occlusion and stenosis of right middle cerebral artery: Secondary | ICD-10-CM | POA: Diagnosis not present

## 2021-05-28 DIAGNOSIS — R41841 Cognitive communication deficit: Secondary | ICD-10-CM | POA: Diagnosis not present

## 2021-05-28 DIAGNOSIS — I69812 Visuospatial deficit and spatial neglect following other cerebrovascular disease: Secondary | ICD-10-CM | POA: Diagnosis not present

## 2021-05-28 DIAGNOSIS — I5032 Chronic diastolic (congestive) heart failure: Secondary | ICD-10-CM | POA: Diagnosis not present

## 2021-05-28 DIAGNOSIS — R278 Other lack of coordination: Secondary | ICD-10-CM | POA: Diagnosis not present

## 2021-05-31 ENCOUNTER — Other Ambulatory Visit: Payer: Self-pay | Admitting: Adult Health

## 2021-05-31 DIAGNOSIS — I5022 Chronic systolic (congestive) heart failure: Secondary | ICD-10-CM | POA: Diagnosis not present

## 2021-05-31 DIAGNOSIS — R293 Abnormal posture: Secondary | ICD-10-CM | POA: Diagnosis not present

## 2021-05-31 DIAGNOSIS — I5032 Chronic diastolic (congestive) heart failure: Secondary | ICD-10-CM | POA: Diagnosis not present

## 2021-05-31 DIAGNOSIS — I69354 Hemiplegia and hemiparesis following cerebral infarction affecting left non-dominant side: Secondary | ICD-10-CM | POA: Diagnosis not present

## 2021-05-31 DIAGNOSIS — S065X3S Traumatic subdural hemorrhage with loss of consciousness of 1 hour to 5 hours 59 minutes, sequela: Secondary | ICD-10-CM | POA: Diagnosis not present

## 2021-05-31 DIAGNOSIS — H538 Other visual disturbances: Secondary | ICD-10-CM | POA: Diagnosis not present

## 2021-05-31 DIAGNOSIS — G9341 Metabolic encephalopathy: Secondary | ICD-10-CM | POA: Diagnosis not present

## 2021-05-31 DIAGNOSIS — I38 Endocarditis, valve unspecified: Secondary | ICD-10-CM | POA: Diagnosis not present

## 2021-05-31 DIAGNOSIS — I6601 Occlusion and stenosis of right middle cerebral artery: Secondary | ICD-10-CM | POA: Diagnosis not present

## 2021-05-31 DIAGNOSIS — I69391 Dysphagia following cerebral infarction: Secondary | ICD-10-CM | POA: Diagnosis not present

## 2021-05-31 DIAGNOSIS — R278 Other lack of coordination: Secondary | ICD-10-CM | POA: Diagnosis not present

## 2021-05-31 DIAGNOSIS — M6389 Disorders of muscle in diseases classified elsewhere, multiple sites: Secondary | ICD-10-CM | POA: Diagnosis not present

## 2021-05-31 DIAGNOSIS — R41840 Attention and concentration deficit: Secondary | ICD-10-CM | POA: Diagnosis not present

## 2021-05-31 DIAGNOSIS — R41841 Cognitive communication deficit: Secondary | ICD-10-CM | POA: Diagnosis not present

## 2021-05-31 DIAGNOSIS — I69812 Visuospatial deficit and spatial neglect following other cerebrovascular disease: Secondary | ICD-10-CM | POA: Diagnosis not present

## 2021-05-31 LAB — BASIC METABOLIC PANEL
BUN: 24 — AB (ref 4–21)
CO2: 24 — AB (ref 13–22)
Chloride: 106 (ref 99–108)
Creatinine: 0.6 (ref 0.6–1.3)
Glucose: 73
Potassium: 4 mEq/L (ref 3.5–5.1)
Sodium: 142 (ref 137–147)

## 2021-05-31 LAB — COMPREHENSIVE METABOLIC PANEL: Calcium: 9 (ref 8.7–10.7)

## 2021-05-31 MED ORDER — LACOSAMIDE 50 MG PO TABS: 50.0000 mg | ORAL_TABLET | Freq: Two times a day (BID) | ORAL | 3 refills | Status: DC

## 2021-06-01 DIAGNOSIS — S065X3S Traumatic subdural hemorrhage with loss of consciousness of 1 hour to 5 hours 59 minutes, sequela: Secondary | ICD-10-CM | POA: Diagnosis not present

## 2021-06-01 DIAGNOSIS — I5032 Chronic diastolic (congestive) heart failure: Secondary | ICD-10-CM | POA: Diagnosis not present

## 2021-06-01 DIAGNOSIS — R41840 Attention and concentration deficit: Secondary | ICD-10-CM | POA: Diagnosis not present

## 2021-06-01 DIAGNOSIS — H538 Other visual disturbances: Secondary | ICD-10-CM | POA: Diagnosis not present

## 2021-06-01 DIAGNOSIS — I69391 Dysphagia following cerebral infarction: Secondary | ICD-10-CM | POA: Diagnosis not present

## 2021-06-01 DIAGNOSIS — I69812 Visuospatial deficit and spatial neglect following other cerebrovascular disease: Secondary | ICD-10-CM | POA: Diagnosis not present

## 2021-06-01 DIAGNOSIS — I69354 Hemiplegia and hemiparesis following cerebral infarction affecting left non-dominant side: Secondary | ICD-10-CM | POA: Diagnosis not present

## 2021-06-01 DIAGNOSIS — R293 Abnormal posture: Secondary | ICD-10-CM | POA: Diagnosis not present

## 2021-06-01 DIAGNOSIS — R278 Other lack of coordination: Secondary | ICD-10-CM | POA: Diagnosis not present

## 2021-06-01 DIAGNOSIS — R41841 Cognitive communication deficit: Secondary | ICD-10-CM | POA: Diagnosis not present

## 2021-06-01 DIAGNOSIS — M6389 Disorders of muscle in diseases classified elsewhere, multiple sites: Secondary | ICD-10-CM | POA: Diagnosis not present

## 2021-06-01 DIAGNOSIS — I6601 Occlusion and stenosis of right middle cerebral artery: Secondary | ICD-10-CM | POA: Diagnosis not present

## 2021-06-01 DIAGNOSIS — I5022 Chronic systolic (congestive) heart failure: Secondary | ICD-10-CM | POA: Diagnosis not present

## 2021-06-01 DIAGNOSIS — G9341 Metabolic encephalopathy: Secondary | ICD-10-CM | POA: Diagnosis not present

## 2021-06-02 ENCOUNTER — Encounter: Payer: Medicare Other | Attending: Physical Medicine & Rehabilitation | Admitting: Physical Medicine & Rehabilitation

## 2021-06-02 ENCOUNTER — Encounter: Payer: Self-pay | Admitting: Physical Medicine & Rehabilitation

## 2021-06-02 VITALS — BP 109/75 | HR 82 | Ht 67.0 in | Wt 143.6 lb

## 2021-06-02 DIAGNOSIS — G479 Sleep disorder, unspecified: Secondary | ICD-10-CM | POA: Insufficient documentation

## 2021-06-02 DIAGNOSIS — H538 Other visual disturbances: Secondary | ICD-10-CM | POA: Diagnosis not present

## 2021-06-02 DIAGNOSIS — M6389 Disorders of muscle in diseases classified elsewhere, multiple sites: Secondary | ICD-10-CM | POA: Diagnosis not present

## 2021-06-02 DIAGNOSIS — I69354 Hemiplegia and hemiparesis following cerebral infarction affecting left non-dominant side: Secondary | ICD-10-CM | POA: Diagnosis not present

## 2021-06-02 DIAGNOSIS — I6601 Occlusion and stenosis of right middle cerebral artery: Secondary | ICD-10-CM | POA: Insufficient documentation

## 2021-06-02 DIAGNOSIS — R293 Abnormal posture: Secondary | ICD-10-CM | POA: Diagnosis not present

## 2021-06-02 DIAGNOSIS — I5032 Chronic diastolic (congestive) heart failure: Secondary | ICD-10-CM | POA: Diagnosis not present

## 2021-06-02 DIAGNOSIS — I5022 Chronic systolic (congestive) heart failure: Secondary | ICD-10-CM | POA: Diagnosis not present

## 2021-06-02 DIAGNOSIS — S065X3S Traumatic subdural hemorrhage with loss of consciousness of 1 hour to 5 hours 59 minutes, sequela: Secondary | ICD-10-CM | POA: Diagnosis not present

## 2021-06-02 DIAGNOSIS — I69812 Visuospatial deficit and spatial neglect following other cerebrovascular disease: Secondary | ICD-10-CM | POA: Diagnosis not present

## 2021-06-02 DIAGNOSIS — S069X3S Unspecified intracranial injury with loss of consciousness of 1 hour to 5 hours 59 minutes, sequela: Secondary | ICD-10-CM | POA: Diagnosis not present

## 2021-06-02 DIAGNOSIS — R41840 Attention and concentration deficit: Secondary | ICD-10-CM | POA: Diagnosis not present

## 2021-06-02 DIAGNOSIS — G9341 Metabolic encephalopathy: Secondary | ICD-10-CM | POA: Diagnosis not present

## 2021-06-02 DIAGNOSIS — R278 Other lack of coordination: Secondary | ICD-10-CM | POA: Diagnosis not present

## 2021-06-02 DIAGNOSIS — I69391 Dysphagia following cerebral infarction: Secondary | ICD-10-CM | POA: Diagnosis not present

## 2021-06-02 DIAGNOSIS — R41841 Cognitive communication deficit: Secondary | ICD-10-CM | POA: Diagnosis not present

## 2021-06-02 MED ORDER — ARMODAFINIL 50 MG PO TABS: 100.0000 mg | ORAL_TABLET | Freq: Every day | ORAL | 3 refills | Status: DC

## 2021-06-02 NOTE — Progress Notes (Signed)
? ?Subjective:  ? ? Patient ID: [name] [name], male    DOB: [birth year], 81 y.o.   MRN: [id number] ? ?HPI ? ?Mr. Knoble is here in follow up of his TBI and right MCA infarct. He continues to work with PT OT SLP at [facility]. He has his up and down days.  His wife feels that he is gone downwards from a cognitive standpoint.  He is hard to engage with on a daily basis.  He she feels that speech therapy has difficulty getting much out of him.  Today he was very alert and his son and wife both said that this was as good as he has been from an arousal standpoint.  He has developed some spasticity in the left side.  Therapy is mention Botox. ? ?He remains on amantadine for arousal.  Sleep still seems to be inconsistent.  He was a night owl previous to his injuries which has not helped his sleep habits.  We have had to back off some of the other sleeping medications due to morning lethargy. ? ? ?Pain Inventory ?Average Pain 0 ?Pain Right Now 0 ?My pain is  no pain ? ?LOCATION OF PAIN  no pain ? ?BOWEL ?Number of stools per week: 7+ ?Oral laxative use Yes  ?Type of laxative Miralax ?Enema or suppository use No  ?History of colostomy No  ?Incontinent No  ? ?BLADDER ?Pads ?In and out cath, frequency na ?Able to self cath  na ?Bladder incontinence No  ?Frequent urination No  ?Leakage with coughing No  ?Difficulty starting stream No  ?Incomplete bladder emptying No  ? ? ?Mobility ?ability to climb steps?  no ?do you drive?  no ?use a wheelchair ?needs help with transfers ? ?Function ?retired ?I need assistance with the following:  feeding, dressing, bathing, and toileting ? ?Neuro/Psych ?weakness ?spasms ?confusion ? ?Prior Studies ?Any changes since last visit?  no ? ?Physicians involved in your care ?Any changes since last visit?  no ? ? ?Family History  ?Problem Relation Age of Onset  ? Macular degeneration Mother   ?     macular degeneration  ? Cancer Father   ?     intestinal/GI  ? Diabetes Paternal Aunt   ? Heart attack  Maternal Grandfather   ? Diabetes Brother   ? ?Social History  ? ?Socioeconomic History  ? Marital status: Married  ?  Spouse name: Herbert Pun  ? Number of children: 2  ? Years of education: BS  ? Highest education level: Not on file  ?Occupational History  ? Occupation: retired  ?Tobacco Use  ? Smoking status: Never  ? Smokeless tobacco: Never  ?Vaping Use  ? Vaping Use: Never used  ?Substance and Sexual Activity  ? Alcohol use: Never  ?  Comment: wine occasionally  ? Drug use: Never  ? Sexual activity: Not Currently  ?  Partners: Female  ?Other Topics Concern  ? Not on file  ?Social History Narrative  ? 10/27/20 residing at [facility] care center since 09/17/20  ?  [address] - BS, JD. married - '67. 2 sons - '74, '77  one son is a Nurse, learning disability for [employer]; 4 grandchildren. 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 marriage in good health. End-of-life: discussed issues and provided packet with the MOST form and out of facility order.  ? ?Social Determinants of Health  ? ?Financial Resource Strain: Not on file  ?Food Insecurity:  Not on file  ?Transportation Needs: Not on file  ?Physical Activity: Not on file  ?Stress: Not on file  ?Social Connections: Not on file  ? ?Past Surgical History:  ?Procedure Laterality Date  ? AORTIC AND MITRAL VALVE REPLACEMENT    ? 09/2004  ? CARDIOVERSION    ? 3 times from 2004-2006  ? CARDIOVERSION N/A 09/26/2013  ? Procedure: CARDIOVERSION;  Surgeon: Larey Dresser, MD;  Location: [place];  Service: Cardiovascular;  Laterality: N/A;  ? CARDIOVERSION N/A 06/19/2014  ? Procedure: CARDIOVERSION;  Surgeon: Jerline Pain, MD;  Location: [place];  Service: Cardiovascular;  Laterality: N/A;  ? CARDIOVERSION N/A 04/24/2017  ? Procedure: CARDIOVERSION;  Surgeon: Larey Dresser, MD;  Location: [facility]COPY;  Service: Cardiovascular;  Laterality: N/A;  ? CARDIOVERSION N/A 06/08/2017  ? Procedure:  CARDIOVERSION;  Surgeon: Pixie Casino, MD;  Location: [place];  Service: Cardiovascular;  Laterality: N/A;  ? CARDIOVERSION N/A 08/24/2017  ? Procedure: CARDIOVERSION;  Surgeon: Skeet Latch, MD;  Location: [place];  Service: Cardiovascular;  Laterality: N/A;  ? CARDIOVERSION N/A 02/14/2018  ? Procedure: CARDIOVERSION;  Surgeon: Larey Dresser, MD;  Location: [facility] ENDOSCOPY;  Service: Cardiovascular;  Laterality: N/A;  ? COLONOSCOPY    ? CRANIOTOMY N/A 07/28/2020  ? Procedure: CRANIOTOMY FOR EVACUATION OF SUBDURAL HEMATOMA;  Surgeon: Eustace Goldenstein, MD;  Location: [place];  Service: Neurosurgery;  Laterality: N/A;  ? IR ANGIO VERTEBRAL SEL SUBCLAVIAN INNOMINATE UNI R MOD SED  08/07/2020  ? IR CT HEAD LTD  08/07/2020  ? IR PERCUTANEOUS ART THROMBECTOMY/INFUSION INTRACRANIAL INC DIAG ANGIO  08/07/2020  ? laminectomies    ? 10/1975 and in 03/2005  ? RADIOLOGY WITH ANESTHESIA N/A 08/07/2020  ? Procedure: IR WITH ANESTHESIA;  Surgeon: Luanne Bras, MD;  Location: [place];  Service: Radiology;  Laterality: N/A;  ? TEE WITHOUT CARDIOVERSION N/A 12/18/2020  ? Procedure: TRANSESOPHAGEAL ECHOCARDIOGRAM (TEE);  Surgeon: Larey Dresser, MD;  Location: [facility] ENDOSCOPY;  Service: Cardiovascular;  Laterality: N/A;  ? TONSILLECTOMY    ? 1950  ? TOTAL HIP ARTHROPLASTY Right 04/03/2018  ? Procedure: TOTAL HIP ARTHROPLASTY ANTERIOR APPROACH;  Surgeon: Paralee Cancel, MD;  Location: [facility];  Service: Orthopedics;  Laterality: Right;  70 mins  ? ?Past Medical History:  ?Diagnosis Date  ? Acute rheumatic heart disease, unspecified   ? childhood, age  89 & 77  ? Acute rheumatic pericarditis   ? Atrial fibrillation ([place])   ? history  ? CHF (congestive heart failure) ([place])   ? Diverticulosis   ? Dysrhythmia   ? 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 External hemorrhoids without mention of complication   ? H/O aortic valve replacement   ? H/O mitral valve replacement   ? Lesion of ulnar nerve   ? injury / left arm  ? Lesion of ulnar nerve   ?  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 Other and unspecified hyperlipidemia   ? Pre-diabetes   ? Previous back surgery 1978, jan 2007  ? Psychosexual dysfunction with inhibited sexual excitement   ? SOB (shortness of breath)   ? "with heavy exercise"  ? Stroke ([place]) 08/2013  ? "I WAS IN AFIB AND THREW A CLOT, THE EFFECTS WERE TRANSITORY AND DIDNT LAST BUT FOR 30 MINUTES"   ? Thoracic aortic aneurysm   ? ?BP 109/75   Pulse 82   Ht '5\' 7"'$  (1.702 m)   Wt 143 lb 9.6 oz (65.1 kg)  SpO2 97%   BMI 22.49 kg/m?  ? ?Opioid Risk Score:   ?Fall Risk Score:  `1 ? ?Depression screen PHQ 2/9 ? ? ?  01/18/2021  ?  3:14 PM 07/11/2019  ?  1:36 PM 06/27/2018  ?  2:47 PM 05/23/2017  ?  7:55 AM 05/22/2017  ? 10:19 AM 05/19/2016  ? 10:36 AM 12/23/2013  ?  7:52 AM  ?Depression screen PHQ 2/9  ?Decreased Interest 0 0 0 0 0 0 0  ?Down, Depressed, Hopeless 0 0 0 0 0 0 0  ?PHQ - 2 Score 0 0 0 0 0 0 0  ?  ?Review of Systems  ?Musculoskeletal:   ?     Spasms  ?Neurological:  Positive for weakness.  ?Psychiatric/Behavioral:  Positive for confusion.   ?All other systems reviewed and are negative. ? ?   ?Objective:  ? Physical Exam ? ?Gen: no distress, normal appearing ?HEENT: oral mucosa pink and moist, NCAT ?Cardio: Reg rate ?Chest: normal effort, normal rate of breathing ?Abd: soft, non-distended ?Ext: no edema ?Psych: pleasant, normal affect ?Skin: intact.  ?Neuro: Patient more alert today when I came in the room.  His eyes were open.  He did make eye contact and say that he was glad to see me.  He actually phonate it today.  He seemed to tail off after that and very much slower with his processing.  .  Patient with 1 to 1+ out of 4 tone in the left wrist and finger flexors as well as biceps, trace tone at the pectoralis muscles.  Hamstrings are 1+ to 2 out of 4.  He does have movement in the left upper extremity at 1+ to 2 out of 5.  Left lower extremity similar in 1-2 out of 5 but inconsistent.  He engages better with the right  side and seems to move both fairly freely but does have some processing and motor planning issues there even. ?Musculoskeletal: left scm, trap, ls tight. Can be positioned into neutral. ?  ?  ?  ?  ?Assessment & Plan:  ?

## 2021-06-02 NOTE — Patient Instructions (Signed)
PLEASE FEEL FREE TO CALL OUR OFFICE WITH ANY PROBLEMS OR QUESTIONS ([phone])      

## 2021-06-03 ENCOUNTER — Telehealth: Payer: Self-pay | Admitting: *Deleted

## 2021-06-03 DIAGNOSIS — I6601 Occlusion and stenosis of right middle cerebral artery: Secondary | ICD-10-CM | POA: Diagnosis not present

## 2021-06-03 DIAGNOSIS — M6389 Disorders of muscle in diseases classified elsewhere, multiple sites: Secondary | ICD-10-CM | POA: Diagnosis not present

## 2021-06-03 DIAGNOSIS — I5032 Chronic diastolic (congestive) heart failure: Secondary | ICD-10-CM | POA: Diagnosis not present

## 2021-06-03 DIAGNOSIS — R41840 Attention and concentration deficit: Secondary | ICD-10-CM | POA: Diagnosis not present

## 2021-06-03 DIAGNOSIS — I69354 Hemiplegia and hemiparesis following cerebral infarction affecting left non-dominant side: Secondary | ICD-10-CM | POA: Diagnosis not present

## 2021-06-03 DIAGNOSIS — I5022 Chronic systolic (congestive) heart failure: Secondary | ICD-10-CM | POA: Diagnosis not present

## 2021-06-03 DIAGNOSIS — R293 Abnormal posture: Secondary | ICD-10-CM | POA: Diagnosis not present

## 2021-06-03 DIAGNOSIS — R278 Other lack of coordination: Secondary | ICD-10-CM | POA: Diagnosis not present

## 2021-06-03 DIAGNOSIS — G9341 Metabolic encephalopathy: Secondary | ICD-10-CM | POA: Diagnosis not present

## 2021-06-03 DIAGNOSIS — S065X3S Traumatic subdural hemorrhage with loss of consciousness of 1 hour to 5 hours 59 minutes, sequela: Secondary | ICD-10-CM | POA: Diagnosis not present

## 2021-06-03 DIAGNOSIS — I69812 Visuospatial deficit and spatial neglect following other cerebrovascular disease: Secondary | ICD-10-CM | POA: Diagnosis not present

## 2021-06-03 DIAGNOSIS — I69391 Dysphagia following cerebral infarction: Secondary | ICD-10-CM | POA: Diagnosis not present

## 2021-06-03 DIAGNOSIS — H538 Other visual disturbances: Secondary | ICD-10-CM | POA: Diagnosis not present

## 2021-06-03 DIAGNOSIS — R41841 Cognitive communication deficit: Secondary | ICD-10-CM | POA: Diagnosis not present

## 2021-06-03 NOTE — Telephone Encounter (Signed)
Abigail Butts RN called from [facility] with questions about the Amantadine order.  The order said Amantadine 100 mg q day x 4 then stop.  He was already on Amantadine 100 mg bid--do they stop this order? ? ?Also the Nuvigil needs prior auth done. This will be submitted via CoverMyMeds by [facility]. ?

## 2021-06-04 ENCOUNTER — Telehealth: Payer: Self-pay | Admitting: *Deleted

## 2021-06-04 DIAGNOSIS — I5022 Chronic systolic (congestive) heart failure: Secondary | ICD-10-CM | POA: Diagnosis not present

## 2021-06-04 DIAGNOSIS — G9341 Metabolic encephalopathy: Secondary | ICD-10-CM | POA: Diagnosis not present

## 2021-06-04 DIAGNOSIS — M6389 Disorders of muscle in diseases classified elsewhere, multiple sites: Secondary | ICD-10-CM | POA: Diagnosis not present

## 2021-06-04 DIAGNOSIS — S065X3S Traumatic subdural hemorrhage with loss of consciousness of 1 hour to 5 hours 59 minutes, sequela: Secondary | ICD-10-CM | POA: Diagnosis not present

## 2021-06-04 DIAGNOSIS — H538 Other visual disturbances: Secondary | ICD-10-CM | POA: Diagnosis not present

## 2021-06-04 DIAGNOSIS — I6601 Occlusion and stenosis of right middle cerebral artery: Secondary | ICD-10-CM | POA: Diagnosis not present

## 2021-06-04 DIAGNOSIS — I69812 Visuospatial deficit and spatial neglect following other cerebrovascular disease: Secondary | ICD-10-CM | POA: Diagnosis not present

## 2021-06-04 DIAGNOSIS — R41840 Attention and concentration deficit: Secondary | ICD-10-CM | POA: Diagnosis not present

## 2021-06-04 DIAGNOSIS — R293 Abnormal posture: Secondary | ICD-10-CM | POA: Diagnosis not present

## 2021-06-04 DIAGNOSIS — R278 Other lack of coordination: Secondary | ICD-10-CM | POA: Diagnosis not present

## 2021-06-04 NOTE — Telephone Encounter (Signed)
Denied. Appeal will not help if he does not have dx of Narcolepsy. ?

## 2021-06-04 NOTE — Telephone Encounter (Signed)
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 ?

## 2021-06-04 NOTE — Telephone Encounter (Signed)
I did the prior auth and it was denied. It may be a quantity limit issue.  Is there a reason the higher dose of 150 mg #30 should not be used?  ?

## 2021-06-04 NOTE — Telephone Encounter (Signed)
Prior auth submitted for Nuvigil to [place] via CoverMyMeds. ?

## 2021-06-07 DIAGNOSIS — I69354 Hemiplegia and hemiparesis following cerebral infarction affecting left non-dominant side: Secondary | ICD-10-CM | POA: Diagnosis not present

## 2021-06-07 DIAGNOSIS — M6389 Disorders of muscle in diseases classified elsewhere, multiple sites: Secondary | ICD-10-CM | POA: Diagnosis not present

## 2021-06-07 DIAGNOSIS — I6601 Occlusion and stenosis of right middle cerebral artery: Secondary | ICD-10-CM | POA: Diagnosis not present

## 2021-06-07 DIAGNOSIS — R41841 Cognitive communication deficit: Secondary | ICD-10-CM | POA: Diagnosis not present

## 2021-06-07 DIAGNOSIS — G9341 Metabolic encephalopathy: Secondary | ICD-10-CM | POA: Diagnosis not present

## 2021-06-07 DIAGNOSIS — I38 Endocarditis, valve unspecified: Secondary | ICD-10-CM | POA: Diagnosis not present

## 2021-06-07 DIAGNOSIS — R41840 Attention and concentration deficit: Secondary | ICD-10-CM | POA: Diagnosis not present

## 2021-06-07 DIAGNOSIS — H538 Other visual disturbances: Secondary | ICD-10-CM | POA: Diagnosis not present

## 2021-06-07 DIAGNOSIS — I5022 Chronic systolic (congestive) heart failure: Secondary | ICD-10-CM | POA: Diagnosis not present

## 2021-06-07 DIAGNOSIS — R293 Abnormal posture: Secondary | ICD-10-CM | POA: Diagnosis not present

## 2021-06-07 DIAGNOSIS — I69812 Visuospatial deficit and spatial neglect following other cerebrovascular disease: Secondary | ICD-10-CM | POA: Diagnosis not present

## 2021-06-07 DIAGNOSIS — I69391 Dysphagia following cerebral infarction: Secondary | ICD-10-CM | POA: Diagnosis not present

## 2021-06-07 DIAGNOSIS — I5032 Chronic diastolic (congestive) heart failure: Secondary | ICD-10-CM | POA: Diagnosis not present

## 2021-06-07 DIAGNOSIS — S065X3S Traumatic subdural hemorrhage with loss of consciousness of 1 hour to 5 hours 59 minutes, sequela: Secondary | ICD-10-CM | POA: Diagnosis not present

## 2021-06-07 DIAGNOSIS — R278 Other lack of coordination: Secondary | ICD-10-CM | POA: Diagnosis not present

## 2021-06-07 LAB — BASIC METABOLIC PANEL
BUN: 17 (ref 4–21)
CO2: 26 — AB (ref 13–22)
Chloride: 110 — AB (ref 99–108)
Creatinine: 0.7 (ref 0.6–1.3)
Glucose: 97
Potassium: 4.3 mEq/L (ref 3.5–5.1)
Sodium: 147 (ref 137–147)

## 2021-06-07 LAB — COMPREHENSIVE METABOLIC PANEL: Calcium: 9.5 (ref 8.7–10.7)

## 2021-06-08 DIAGNOSIS — H538 Other visual disturbances: Secondary | ICD-10-CM | POA: Diagnosis not present

## 2021-06-08 DIAGNOSIS — I6601 Occlusion and stenosis of right middle cerebral artery: Secondary | ICD-10-CM | POA: Diagnosis not present

## 2021-06-08 DIAGNOSIS — R41841 Cognitive communication deficit: Secondary | ICD-10-CM | POA: Diagnosis not present

## 2021-06-08 DIAGNOSIS — R278 Other lack of coordination: Secondary | ICD-10-CM | POA: Diagnosis not present

## 2021-06-08 DIAGNOSIS — R293 Abnormal posture: Secondary | ICD-10-CM | POA: Diagnosis not present

## 2021-06-08 DIAGNOSIS — I69354 Hemiplegia and hemiparesis following cerebral infarction affecting left non-dominant side: Secondary | ICD-10-CM | POA: Diagnosis not present

## 2021-06-08 DIAGNOSIS — I5032 Chronic diastolic (congestive) heart failure: Secondary | ICD-10-CM | POA: Diagnosis not present

## 2021-06-08 DIAGNOSIS — R41840 Attention and concentration deficit: Secondary | ICD-10-CM | POA: Diagnosis not present

## 2021-06-08 DIAGNOSIS — M6389 Disorders of muscle in diseases classified elsewhere, multiple sites: Secondary | ICD-10-CM | POA: Diagnosis not present

## 2021-06-08 DIAGNOSIS — I69391 Dysphagia following cerebral infarction: Secondary | ICD-10-CM | POA: Diagnosis not present

## 2021-06-08 DIAGNOSIS — I69812 Visuospatial deficit and spatial neglect following other cerebrovascular disease: Secondary | ICD-10-CM | POA: Diagnosis not present

## 2021-06-08 DIAGNOSIS — I5022 Chronic systolic (congestive) heart failure: Secondary | ICD-10-CM | POA: Diagnosis not present

## 2021-06-08 DIAGNOSIS — G9341 Metabolic encephalopathy: Secondary | ICD-10-CM | POA: Diagnosis not present

## 2021-06-08 DIAGNOSIS — S065X3S Traumatic subdural hemorrhage with loss of consciousness of 1 hour to 5 hours 59 minutes, sequela: Secondary | ICD-10-CM | POA: Diagnosis not present

## 2021-06-09 ENCOUNTER — Ambulatory Visit (INDEPENDENT_AMBULATORY_CARE_PROVIDER_SITE_OTHER): Payer: Medicare Other | Admitting: Neurology

## 2021-06-09 ENCOUNTER — Encounter: Payer: Self-pay | Admitting: Neurology

## 2021-06-09 VITALS — BP 128/85 | HR 74 | Ht 67.0 in | Wt 142.6 lb

## 2021-06-09 DIAGNOSIS — S065X3S Traumatic subdural hemorrhage with loss of consciousness of 1 hour to 5 hours 59 minutes, sequela: Secondary | ICD-10-CM | POA: Diagnosis not present

## 2021-06-09 DIAGNOSIS — I63412 Cerebral infarction due to embolism of left middle cerebral artery: Secondary | ICD-10-CM

## 2021-06-09 DIAGNOSIS — R293 Abnormal posture: Secondary | ICD-10-CM | POA: Diagnosis not present

## 2021-06-09 DIAGNOSIS — I69354 Hemiplegia and hemiparesis following cerebral infarction affecting left non-dominant side: Secondary | ICD-10-CM | POA: Diagnosis not present

## 2021-06-09 DIAGNOSIS — G9341 Metabolic encephalopathy: Secondary | ICD-10-CM | POA: Diagnosis not present

## 2021-06-09 DIAGNOSIS — G4710 Hypersomnia, unspecified: Secondary | ICD-10-CM

## 2021-06-09 DIAGNOSIS — R41841 Cognitive communication deficit: Secondary | ICD-10-CM | POA: Diagnosis not present

## 2021-06-09 DIAGNOSIS — M6389 Disorders of muscle in diseases classified elsewhere, multiple sites: Secondary | ICD-10-CM | POA: Diagnosis not present

## 2021-06-09 DIAGNOSIS — I330 Acute and subacute infective endocarditis: Secondary | ICD-10-CM | POA: Diagnosis not present

## 2021-06-09 DIAGNOSIS — I6601 Occlusion and stenosis of right middle cerebral artery: Secondary | ICD-10-CM | POA: Diagnosis not present

## 2021-06-09 DIAGNOSIS — I4819 Other persistent atrial fibrillation: Secondary | ICD-10-CM | POA: Diagnosis not present

## 2021-06-09 DIAGNOSIS — I5022 Chronic systolic (congestive) heart failure: Secondary | ICD-10-CM | POA: Diagnosis not present

## 2021-06-09 DIAGNOSIS — I69812 Visuospatial deficit and spatial neglect following other cerebrovascular disease: Secondary | ICD-10-CM | POA: Diagnosis not present

## 2021-06-09 DIAGNOSIS — R278 Other lack of coordination: Secondary | ICD-10-CM | POA: Diagnosis not present

## 2021-06-09 DIAGNOSIS — I69391 Dysphagia following cerebral infarction: Secondary | ICD-10-CM | POA: Diagnosis not present

## 2021-06-09 DIAGNOSIS — R41840 Attention and concentration deficit: Secondary | ICD-10-CM | POA: Diagnosis not present

## 2021-06-09 DIAGNOSIS — H538 Other visual disturbances: Secondary | ICD-10-CM | POA: Diagnosis not present

## 2021-06-09 DIAGNOSIS — I484 Atypical atrial flutter: Secondary | ICD-10-CM

## 2021-06-09 DIAGNOSIS — I5032 Chronic diastolic (congestive) heart failure: Secondary | ICD-10-CM | POA: Diagnosis not present

## 2021-06-09 MED ORDER — ARMODAFINIL 50 MG PO TABS: 100.0000 mg | ORAL_TABLET | Freq: Every day | ORAL | 2 refills | Status: DC

## 2021-06-09 NOTE — Patient Instructions (Signed)
Hypersomnia Hypersomnia is a condition in which a person feels very tired during the day even though he or she gets plenty of sleep at night. A person with this condition may take naps during the day and may find it very difficult to wake up from sleep. Hypersomnia may affect a person's ability to think, concentrate, drive, or remember things. What are the causes? The cause of this condition may not be known. Possible causes include: Certain medicines. Sleep disorders, such as narcolepsy and sleep apnea. Injury to the head, brain, or spinal cord. Drug or alcohol use. Gastroesophageal reflux disease (GERD). Tumors. Certain medical conditions, such as depression, diabetes, or an underactive thyroid gland (hypothyroidism). What are the signs or symptoms? The main symptoms of hypersomnia include: Feeling very tired throughout the day, regardless of how much sleep you got the night before. Having trouble waking up. Others may find it difficult to wake you up when you are sleeping. Sleeping for longer and longer periods at a time. Taking naps throughout the day. Other symptoms may include: Feeling restless, anxious, or annoyed. Lacking energy. Having trouble with: Remembering. Speaking. Thinking. Loss of appetite. Seeing, hearing, tasting, smelling, or feeling things that are not real (hallucinations). How is this diagnosed? This condition may be diagnosed based on: Your symptoms and medical history. Your sleeping habits. Your health care provider may ask you to write down your sleeping habits in a daily sleep log, along with any symptoms you have. A series of tests that are done while you sleep (sleep study or polysomnogram). A test that measures how quickly you can fall asleep during the day (daytime nap study or multiple sleep latency test). How is this treated? Treatment can help you manage your condition. Treatment may include: Following a regular sleep routine. Lifestyle changes,  such as changing your eating habits, getting regular exercise, and avoiding alcohol or caffeinated beverages. Taking medicines to make you more alert (stimulants) during the day. Treating any underlying medical causes of hypersomnia. Follow these instructions at home: Sleep routine  Schedule the same bedtime and wake-up time each day. Practice a relaxing bedtime routine. This may include reading, meditation, deep breathing, or taking a warm bath before going to sleep. Get regular exercise each day. Avoid strenuous exercise in the evening hours. Keep your sleep environment at a cooler temperature, darkened, and quiet. Sleep with pillows and a mattress that are comfortable and supportive. Schedule short 20-minute naps for when you feel sleepiest during the day. Talk with your employer or teachers about your hypersomnia. If possible, adjust your schedule so that: You have a regular daytime work schedule. You can take a scheduled nap during the day. You do not have to work or be active at night. Do not eat a heavy meal for a few hours before bedtime. Eat your meals at about the same times every day. Avoid drinking alcohol or caffeinated beverages. Safety  Do not drive or use heavy machinery if you are sleepy. Ask your health care provider if it is safe for you to drive. Wear a life jacket when swimming or spending time near water. General instructions Take supplements and over-the-counter and prescription medicines only as told by your health care provider. Keep a sleep log that will help your doctor manage your condition. This may include information about: What time you go to bed each night. How often you wake up at night. How many hours you sleep at night. How often and for how long you nap during the day.   Any observations from others, such as leg movements during sleep, sleep walking, or snoring. Keep all follow-up visits as told by your health care provider. This is important. Contact  a health care provider if: You have new symptoms. Your symptoms get worse. Get help right away if: You have serious thoughts about hurting yourself or someone else. If you ever feel like you may hurt yourself or others, or have thoughts about taking your own life, get help right away. You can go to your nearest emergency department or call: Your local emergency services (911 in the [place] A suicide crisis helpline, such as the National Suicide Prevention Lifeline at 1-800-273-8255 or 988 in the [place] This is open 24 hours a day. Summary Hypersomnia refers to a condition in which you feel very tired during the day even though you get plenty of sleep at night. A person with this condition may take naps during the day and may find it very difficult to wake up from sleep. Hypersomnia may affect a person's ability to think, concentrate, drive, or remember things. Treatment, such as following a regular sleep routine and making some lifestyle changes, can help you manage your condition. This information is not intended to replace advice given to you by your health care provider. Make sure you discuss any questions you have with your health care provider. Document Revised: 09/16/2020 Document Reviewed: 01/02/2020 Elsevier Patient Education  2022 Elsevier Inc.  

## 2021-06-09 NOTE — Progress Notes (Signed)
? ? ? ?SLEEP MEDICINE CLINIC ?  ? ?Provider:  Larey Seat, MD  ?Primary Care Physician:  [name], MD ?[address] ?[place] [address]  ? ?  ?Referring Provider: Garvin Fila, Md ?[address] ?[address] ?[place],  [place] [address]  ?  ?  ?    ?Chief Complaint according to patient   ?Patient presents with:  ?  ? New Patient (Initial Visit)  ?     ?  ?  ?HISTORY OF PRESENT ILLNESS:  ?[name] [name] is a 81 y.o. year old White or Caucasian male patient seen in consultation  on 06/09/2021 . ? ?Mr. Michalec has a history spanning from [place] 2022 to now raised to very impairing strokes.  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  The progress seem to have been limited somewhat by bouts of lethargy, there are cognitive deficits and especially communication deficits.  He is now in a nursing facility for progressive therapy.   ?He resides at [facility]  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   ?He had fallen at church backwards striking the occiput on Jul 26, 2020 and in the emergency room there was initially no acute finding noted the day after he returned with difficulty speaking and now with right-sided weakness.  Dr. Ronnald Ramp evacuated the left-sided traumatic subdural hemorrhage that had caused mass effect and subfalcine herniation. ? ? ? ? ? ? ?Chief concern according to patient :  Lethargy, possible apnea/  ? ?Patient is unable to answer questions other than yes/ no. ? ?Wheelchair bound, paresis,   ? ? ?  patient was a snorer, but apneas  were not witnessed.  ?  ? Family medical /sleep history: none. Brother has sleep apnea.   ?  ?Social history:  Patient is retired from Garment/textile technologist, private- and lives in a [place].. ? Family status is married . ? ?  ?  ?Sleep habits are as follows: according to his smart watch , he sleeps 2-3 hours, but that is not possible.  ?Hoyer lift needed, medical bed needed.  ? ? ?  ?Review of Systems: ?Out of a complete 14 system review, the patient complains of only the following symptoms, and all other reviewed systems are negative.:  ?Fatigue, sleepiness , snoring, fragmented sleep, Hypersomnia, lives in extended care.  ?  ?How likely are you to doze in the following situations: ?0 = not likely, 1 = slight chance, 2 = moderate chance, 3 = high chance ?  ?Sitting and Reading? Every time within 30 minutes ?Watching Television? " ?Sitting inactive in a public place (theater or meeting)?" He is always seated.  ? ?Lying down in the afternoon when circumstances permit?always  ?Sitting and talking to someone?0 ?Sitting quietly after lunch without alcohol? always ? ?  ? FSS endorsed at 40/ 63 points.  ? ?Social History  ? ?Socioeconomic History  ? Marital status: Married  ?  Spouse name: Herbert Pun  ? Number of children: 2  ? Years of education: BS  ? Highest education level: Not  on file  ?Occupational History  ? Occupation: retired  ?Tobacco Use  ? Smoking status: Never  ? Smokeless tobacco: Never  ?Vaping Use  ? Vaping Use: Never used  ?Substance and Sexual Activity  ? Alcohol use: Never  ?  Comment: quit 5/22  ? Drug use: Never  ? Sexual activity: Not Currently  ?  Partners: Female  ?Other Topics Concern  ? Not on file  ?Social History Narrative  ? 10/27/20 residing at [facility] care center since 09/17/20  ?  [address] - BS, JD. married - '67. 2 sons - '74, '77  one son is a Nurse, learning disability for [employer]; 4 grandchildren. 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 marriage in good health. End-of-life: discussed issues and provided packet with the MOST form and out of facility order.  ?   ? Caffeine: decaf coffee  ? ?Social Determinants of Health  ? ?Financial Resource Strain: Not on file  ?Food Insecurity: Not on file  ?Transportation Needs: Not on file  ?Physical Activity: Not on file  ?Stress: Not on file  ?Social Connections: Not on file  ? ? ?Family History  ?Problem Relation Age of Onset  ? Macular degeneration Mother   ?     macular degeneration  ? Cancer Father   ?     intestinal/GI  ? Diabetes Paternal Aunt   ? Heart attack Maternal Grandfather   ? Diabetes Brother   ? ? ?Past Medical History:  ?Diagnosis Date  ? Acute rheumatic heart disease, unspecified   ? childhood, age  81 & 34  ? Acute rheumatic pericarditis   ? Atrial fibrillation ([place])   ? history  ? CHF (congestive heart failure) ([place])   ? Diverticulosis   ? Dysrhythmia   ? 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 External hemorrhoids without mention of complication   ? H/O aortic valve replacement   ? H/O mitral valve replacement   ? Lesion of ulnar nerve   ? injury / left arm  ? Lesion of ulnar nerve   ? 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 Other and unspecified hyperlipidemia   ? Pre-diabetes   ? Previous back surgery 1978, jan 2007  ? Psychosexual dysfunction with inhibited sexual excitement   ? SOB (shortness of breath)   ? "with heavy exercise"  ? Stroke ([place]) 08/2013  ? "I WAS IN AFIB AND THREW A CLOT, THE EFFECTS WERE TRANSITORY AND DIDNT LAST BUT FOR 30 MINUTES"   ? Thoracic aortic aneurysm ([place])   ? ? ?Past Surgical History:  ?Procedure Laterality Date  ? AORTIC AND MITRAL VALVE REPLACEMENT    ? 09/2004  ? CARDIOVERSION    ? 3 times from 2004-2006  ? CARDIOVERSION N/A 09/26/2013  ? Procedure: CARDIOVERSION;  Surgeon: Larey Dresser, MD;  Location: [place];  Service: Cardiovascular;  Laterality: N/A;  ? CARDIOVERSION N/A 06/19/2014  ? Procedure: CARDIOVERSION;  Surgeon:  Jerline Pain, MD;  Location: [place];  Service: Cardiovascular;  Laterality: N/A;  ? CARDIOVERSION N/A 04/24/2017  ? Procedure: CARDIOVERSION;  Surgeon: Larey Dresser, MD;  Location: [facility] ENDOSCOPY;  Service: Cardiovascular;  Laterality: N/A;  ? CARDIOVERSION N/A 06/08/2017  ? Procedure: CARDIOVERSION;  Surgeon: Pixie Casino, MD;  Location: [place];  Service: Cardiovascular;  Laterality: N/A;  ? CARDIOVERSION N/A 08/24/2017  ? Procedure: CARDIOVERSION;  Surgeon: Skeet Latch, MD;  Location: [place];  Service: Cardiovascular;  Laterality: N/A;  ? CARDIOVERSION N/A 02/14/2018  ? Procedure: CARDIOVERSION;  Surgeon: Larey Dresser, MD;  Location: [facility] ENDOSCOPY;  Service: Cardiovascular;  Laterality: N/A;  ? COLONOSCOPY    ? CRANIOTOMY N/A 07/28/2020  ? Procedure: CRANIOTOMY FOR EVACUATION OF SUBDURAL HEMATOMA;  Surgeon: Eustace Antenucci, MD;  Location: [place];  Service: Neurosurgery;  Laterality: N/A;  ? IR ANGIO VERTEBRAL SEL SUBCLAVIAN INNOMINATE UNI R MOD SED  08/07/2020  ? IR CT HEAD LTD  08/07/2020  ? IR PERCUTANEOUS ART THROMBECTOMY/INFUSION INTRACRANIAL INC DIAG ANGIO  08/07/2020  ? laminectomies    ? 10/1975 and in 03/2005  ? RADIOLOGY WITH ANESTHESIA N/A 08/07/2020  ? Procedure: IR WITH ANESTHESIA;  Surgeon: Luanne Bras, MD;  Location: [place];  Service: Radiology;  Laterality: N/A;  ? TEE WITHOUT CARDIOVERSION N/A 12/18/2020  ? Procedure: TRANSESOPHAGEAL ECHOCARDIOGRAM (TEE);  Surgeon: Larey Dresser, MD;  Location: [facility] ENDOSCOPY;  Service: Cardiovascular;  Laterality: N/A;  ? TONSILLECTOMY    ? 1950  ? TOTAL HIP ARTHROPLASTY Right 04/03/2018  ? Procedure: TOTAL HIP ARTHROPLASTY ANTERIOR APPROACH;  Surgeon: Paralee Cancel, MD;  Location: [facility];  Service: Orthopedics;  Laterality: Right;  70 mins  ?  ? ?Current Outpatient Medications on File Prior to Visit  ?Medication Sig Dispense Refill  ? amantadine (SYMMETREL) 100 MG capsule Take 100 mg by mouth 2 (two) times daily. Between 8-11 am and 12-3  pm    ? amLODipine (NORVASC) 5 MG tablet Take 5 mg by mouth daily.    ? apixaban (ELIQUIS) 5 MG TABS tablet Take 1 tablet (5 mg total) by mouth 2 (two) times daily. 60 tablet 0  ? Artificial Saliva (BIOTENE MO

## 2021-06-09 NOTE — Addendum Note (Signed)
Addended by: Larey Seat on: 06/09/2021 04:32 PM ? ? Modules accepted: Orders ? ?

## 2021-06-10 DIAGNOSIS — I6601 Occlusion and stenosis of right middle cerebral artery: Secondary | ICD-10-CM | POA: Diagnosis not present

## 2021-06-10 DIAGNOSIS — I5032 Chronic diastolic (congestive) heart failure: Secondary | ICD-10-CM | POA: Diagnosis not present

## 2021-06-10 DIAGNOSIS — R293 Abnormal posture: Secondary | ICD-10-CM | POA: Diagnosis not present

## 2021-06-10 DIAGNOSIS — I69812 Visuospatial deficit and spatial neglect following other cerebrovascular disease: Secondary | ICD-10-CM | POA: Diagnosis not present

## 2021-06-10 DIAGNOSIS — G9341 Metabolic encephalopathy: Secondary | ICD-10-CM | POA: Diagnosis not present

## 2021-06-10 DIAGNOSIS — H538 Other visual disturbances: Secondary | ICD-10-CM | POA: Diagnosis not present

## 2021-06-10 DIAGNOSIS — S065X3S Traumatic subdural hemorrhage with loss of consciousness of 1 hour to 5 hours 59 minutes, sequela: Secondary | ICD-10-CM | POA: Diagnosis not present

## 2021-06-10 DIAGNOSIS — M6389 Disorders of muscle in diseases classified elsewhere, multiple sites: Secondary | ICD-10-CM | POA: Diagnosis not present

## 2021-06-10 DIAGNOSIS — R41841 Cognitive communication deficit: Secondary | ICD-10-CM | POA: Diagnosis not present

## 2021-06-10 DIAGNOSIS — R41840 Attention and concentration deficit: Secondary | ICD-10-CM | POA: Diagnosis not present

## 2021-06-10 DIAGNOSIS — I69354 Hemiplegia and hemiparesis following cerebral infarction affecting left non-dominant side: Secondary | ICD-10-CM | POA: Diagnosis not present

## 2021-06-10 DIAGNOSIS — I69391 Dysphagia following cerebral infarction: Secondary | ICD-10-CM | POA: Diagnosis not present

## 2021-06-10 DIAGNOSIS — I5022 Chronic systolic (congestive) heart failure: Secondary | ICD-10-CM | POA: Diagnosis not present

## 2021-06-10 DIAGNOSIS — R278 Other lack of coordination: Secondary | ICD-10-CM | POA: Diagnosis not present

## 2021-06-11 DIAGNOSIS — H538 Other visual disturbances: Secondary | ICD-10-CM | POA: Diagnosis not present

## 2021-06-11 DIAGNOSIS — I5022 Chronic systolic (congestive) heart failure: Secondary | ICD-10-CM | POA: Diagnosis not present

## 2021-06-11 DIAGNOSIS — I6601 Occlusion and stenosis of right middle cerebral artery: Secondary | ICD-10-CM | POA: Diagnosis not present

## 2021-06-11 DIAGNOSIS — R278 Other lack of coordination: Secondary | ICD-10-CM | POA: Diagnosis not present

## 2021-06-11 DIAGNOSIS — R293 Abnormal posture: Secondary | ICD-10-CM | POA: Diagnosis not present

## 2021-06-11 DIAGNOSIS — G9341 Metabolic encephalopathy: Secondary | ICD-10-CM | POA: Diagnosis not present

## 2021-06-11 DIAGNOSIS — R41840 Attention and concentration deficit: Secondary | ICD-10-CM | POA: Diagnosis not present

## 2021-06-11 DIAGNOSIS — I69812 Visuospatial deficit and spatial neglect following other cerebrovascular disease: Secondary | ICD-10-CM | POA: Diagnosis not present

## 2021-06-11 DIAGNOSIS — S065X3S Traumatic subdural hemorrhage with loss of consciousness of 1 hour to 5 hours 59 minutes, sequela: Secondary | ICD-10-CM | POA: Diagnosis not present

## 2021-06-11 DIAGNOSIS — M6389 Disorders of muscle in diseases classified elsewhere, multiple sites: Secondary | ICD-10-CM | POA: Diagnosis not present

## 2021-06-14 DIAGNOSIS — I69812 Visuospatial deficit and spatial neglect following other cerebrovascular disease: Secondary | ICD-10-CM | POA: Diagnosis not present

## 2021-06-14 DIAGNOSIS — R293 Abnormal posture: Secondary | ICD-10-CM | POA: Diagnosis not present

## 2021-06-14 DIAGNOSIS — R278 Other lack of coordination: Secondary | ICD-10-CM | POA: Diagnosis not present

## 2021-06-14 DIAGNOSIS — G9341 Metabolic encephalopathy: Secondary | ICD-10-CM | POA: Diagnosis not present

## 2021-06-14 DIAGNOSIS — I6601 Occlusion and stenosis of right middle cerebral artery: Secondary | ICD-10-CM | POA: Diagnosis not present

## 2021-06-14 DIAGNOSIS — R41840 Attention and concentration deficit: Secondary | ICD-10-CM | POA: Diagnosis not present

## 2021-06-14 DIAGNOSIS — M6389 Disorders of muscle in diseases classified elsewhere, multiple sites: Secondary | ICD-10-CM | POA: Diagnosis not present

## 2021-06-14 DIAGNOSIS — H538 Other visual disturbances: Secondary | ICD-10-CM | POA: Diagnosis not present

## 2021-06-14 DIAGNOSIS — S065X3S Traumatic subdural hemorrhage with loss of consciousness of 1 hour to 5 hours 59 minutes, sequela: Secondary | ICD-10-CM | POA: Diagnosis not present

## 2021-06-14 DIAGNOSIS — I5022 Chronic systolic (congestive) heart failure: Secondary | ICD-10-CM | POA: Diagnosis not present

## 2021-06-14 DIAGNOSIS — E119 Type 2 diabetes mellitus without complications: Secondary | ICD-10-CM | POA: Diagnosis not present

## 2021-06-14 LAB — BASIC METABOLIC PANEL
BUN: 23 — AB (ref 4–21)
CO2: 24 — AB (ref 13–22)
Chloride: 108 (ref 99–108)
Creatinine: 0.7 (ref 0.6–1.3)
Glucose: 95
Potassium: 4.1 mEq/L (ref 3.5–5.1)
Sodium: 144 (ref 137–147)

## 2021-06-14 LAB — COMPREHENSIVE METABOLIC PANEL: Calcium: 9.2 (ref 8.7–10.7)

## 2021-06-15 DIAGNOSIS — G9341 Metabolic encephalopathy: Secondary | ICD-10-CM | POA: Diagnosis not present

## 2021-06-15 DIAGNOSIS — R293 Abnormal posture: Secondary | ICD-10-CM | POA: Diagnosis not present

## 2021-06-15 DIAGNOSIS — I5022 Chronic systolic (congestive) heart failure: Secondary | ICD-10-CM | POA: Diagnosis not present

## 2021-06-15 DIAGNOSIS — R278 Other lack of coordination: Secondary | ICD-10-CM | POA: Diagnosis not present

## 2021-06-15 DIAGNOSIS — I69812 Visuospatial deficit and spatial neglect following other cerebrovascular disease: Secondary | ICD-10-CM | POA: Diagnosis not present

## 2021-06-15 DIAGNOSIS — M6389 Disorders of muscle in diseases classified elsewhere, multiple sites: Secondary | ICD-10-CM | POA: Diagnosis not present

## 2021-06-15 DIAGNOSIS — I6601 Occlusion and stenosis of right middle cerebral artery: Secondary | ICD-10-CM | POA: Diagnosis not present

## 2021-06-15 DIAGNOSIS — H538 Other visual disturbances: Secondary | ICD-10-CM | POA: Diagnosis not present

## 2021-06-15 DIAGNOSIS — R41840 Attention and concentration deficit: Secondary | ICD-10-CM | POA: Diagnosis not present

## 2021-06-15 DIAGNOSIS — S065X3S Traumatic subdural hemorrhage with loss of consciousness of 1 hour to 5 hours 59 minutes, sequela: Secondary | ICD-10-CM | POA: Diagnosis not present

## 2021-06-16 DIAGNOSIS — G9341 Metabolic encephalopathy: Secondary | ICD-10-CM | POA: Diagnosis not present

## 2021-06-16 DIAGNOSIS — R41840 Attention and concentration deficit: Secondary | ICD-10-CM | POA: Diagnosis not present

## 2021-06-16 DIAGNOSIS — I69812 Visuospatial deficit and spatial neglect following other cerebrovascular disease: Secondary | ICD-10-CM | POA: Diagnosis not present

## 2021-06-16 DIAGNOSIS — R293 Abnormal posture: Secondary | ICD-10-CM | POA: Diagnosis not present

## 2021-06-16 DIAGNOSIS — I5022 Chronic systolic (congestive) heart failure: Secondary | ICD-10-CM | POA: Diagnosis not present

## 2021-06-16 DIAGNOSIS — R278 Other lack of coordination: Secondary | ICD-10-CM | POA: Diagnosis not present

## 2021-06-16 DIAGNOSIS — M6389 Disorders of muscle in diseases classified elsewhere, multiple sites: Secondary | ICD-10-CM | POA: Diagnosis not present

## 2021-06-16 DIAGNOSIS — I6601 Occlusion and stenosis of right middle cerebral artery: Secondary | ICD-10-CM | POA: Diagnosis not present

## 2021-06-16 DIAGNOSIS — H538 Other visual disturbances: Secondary | ICD-10-CM | POA: Diagnosis not present

## 2021-06-16 DIAGNOSIS — S065X3S Traumatic subdural hemorrhage with loss of consciousness of 1 hour to 5 hours 59 minutes, sequela: Secondary | ICD-10-CM | POA: Diagnosis not present

## 2021-06-17 DIAGNOSIS — M6389 Disorders of muscle in diseases classified elsewhere, multiple sites: Secondary | ICD-10-CM | POA: Diagnosis not present

## 2021-06-17 DIAGNOSIS — I5022 Chronic systolic (congestive) heart failure: Secondary | ICD-10-CM | POA: Diagnosis not present

## 2021-06-17 DIAGNOSIS — I6601 Occlusion and stenosis of right middle cerebral artery: Secondary | ICD-10-CM | POA: Diagnosis not present

## 2021-06-17 DIAGNOSIS — S065X3S Traumatic subdural hemorrhage with loss of consciousness of 1 hour to 5 hours 59 minutes, sequela: Secondary | ICD-10-CM | POA: Diagnosis not present

## 2021-06-17 DIAGNOSIS — R278 Other lack of coordination: Secondary | ICD-10-CM | POA: Diagnosis not present

## 2021-06-17 DIAGNOSIS — H538 Other visual disturbances: Secondary | ICD-10-CM | POA: Diagnosis not present

## 2021-06-17 DIAGNOSIS — G9341 Metabolic encephalopathy: Secondary | ICD-10-CM | POA: Diagnosis not present

## 2021-06-17 DIAGNOSIS — I69812 Visuospatial deficit and spatial neglect following other cerebrovascular disease: Secondary | ICD-10-CM | POA: Diagnosis not present

## 2021-06-17 DIAGNOSIS — R293 Abnormal posture: Secondary | ICD-10-CM | POA: Diagnosis not present

## 2021-06-17 DIAGNOSIS — R41840 Attention and concentration deficit: Secondary | ICD-10-CM | POA: Diagnosis not present

## 2021-06-18 DIAGNOSIS — M6389 Disorders of muscle in diseases classified elsewhere, multiple sites: Secondary | ICD-10-CM | POA: Diagnosis not present

## 2021-06-18 DIAGNOSIS — R41840 Attention and concentration deficit: Secondary | ICD-10-CM | POA: Diagnosis not present

## 2021-06-18 DIAGNOSIS — G9341 Metabolic encephalopathy: Secondary | ICD-10-CM | POA: Diagnosis not present

## 2021-06-18 DIAGNOSIS — I6601 Occlusion and stenosis of right middle cerebral artery: Secondary | ICD-10-CM | POA: Diagnosis not present

## 2021-06-18 DIAGNOSIS — R293 Abnormal posture: Secondary | ICD-10-CM | POA: Diagnosis not present

## 2021-06-18 DIAGNOSIS — H538 Other visual disturbances: Secondary | ICD-10-CM | POA: Diagnosis not present

## 2021-06-18 DIAGNOSIS — I69812 Visuospatial deficit and spatial neglect following other cerebrovascular disease: Secondary | ICD-10-CM | POA: Diagnosis not present

## 2021-06-18 DIAGNOSIS — S065X3S Traumatic subdural hemorrhage with loss of consciousness of 1 hour to 5 hours 59 minutes, sequela: Secondary | ICD-10-CM | POA: Diagnosis not present

## 2021-06-18 DIAGNOSIS — I5022 Chronic systolic (congestive) heart failure: Secondary | ICD-10-CM | POA: Diagnosis not present

## 2021-06-18 DIAGNOSIS — R278 Other lack of coordination: Secondary | ICD-10-CM | POA: Diagnosis not present

## 2021-06-21 DIAGNOSIS — M6389 Disorders of muscle in diseases classified elsewhere, multiple sites: Secondary | ICD-10-CM | POA: Diagnosis not present

## 2021-06-21 DIAGNOSIS — I69391 Dysphagia following cerebral infarction: Secondary | ICD-10-CM | POA: Diagnosis not present

## 2021-06-21 DIAGNOSIS — I5022 Chronic systolic (congestive) heart failure: Secondary | ICD-10-CM | POA: Diagnosis not present

## 2021-06-21 DIAGNOSIS — R293 Abnormal posture: Secondary | ICD-10-CM | POA: Diagnosis not present

## 2021-06-21 DIAGNOSIS — S065X3S Traumatic subdural hemorrhage with loss of consciousness of 1 hour to 5 hours 59 minutes, sequela: Secondary | ICD-10-CM | POA: Diagnosis not present

## 2021-06-21 DIAGNOSIS — I5032 Chronic diastolic (congestive) heart failure: Secondary | ICD-10-CM | POA: Diagnosis not present

## 2021-06-21 DIAGNOSIS — R41841 Cognitive communication deficit: Secondary | ICD-10-CM | POA: Diagnosis not present

## 2021-06-21 DIAGNOSIS — I69354 Hemiplegia and hemiparesis following cerebral infarction affecting left non-dominant side: Secondary | ICD-10-CM | POA: Diagnosis not present

## 2021-06-21 DIAGNOSIS — G9341 Metabolic encephalopathy: Secondary | ICD-10-CM | POA: Diagnosis not present

## 2021-06-21 DIAGNOSIS — R278 Other lack of coordination: Secondary | ICD-10-CM | POA: Diagnosis not present

## 2021-06-21 DIAGNOSIS — H538 Other visual disturbances: Secondary | ICD-10-CM | POA: Diagnosis not present

## 2021-06-21 DIAGNOSIS — R41840 Attention and concentration deficit: Secondary | ICD-10-CM | POA: Diagnosis not present

## 2021-06-21 DIAGNOSIS — I6601 Occlusion and stenosis of right middle cerebral artery: Secondary | ICD-10-CM | POA: Diagnosis not present

## 2021-06-21 DIAGNOSIS — I69812 Visuospatial deficit and spatial neglect following other cerebrovascular disease: Secondary | ICD-10-CM | POA: Diagnosis not present

## 2021-06-21 DIAGNOSIS — I38 Endocarditis, valve unspecified: Secondary | ICD-10-CM | POA: Diagnosis not present

## 2021-06-21 LAB — BASIC METABOLIC PANEL
BUN: 15 (ref 4–21)
CO2: 26 — AB (ref 13–22)
Chloride: 107 (ref 99–108)
Creatinine: 0.7 (ref 0.6–1.3)
Glucose: 99
Potassium: 4.1 mEq/L (ref 3.5–5.1)
Sodium: 142 (ref 137–147)

## 2021-06-21 LAB — COMPREHENSIVE METABOLIC PANEL: Calcium: 9.4 (ref 8.7–10.7)

## 2021-06-22 DIAGNOSIS — M6389 Disorders of muscle in diseases classified elsewhere, multiple sites: Secondary | ICD-10-CM | POA: Diagnosis not present

## 2021-06-22 DIAGNOSIS — I6601 Occlusion and stenosis of right middle cerebral artery: Secondary | ICD-10-CM | POA: Diagnosis not present

## 2021-06-22 DIAGNOSIS — S065X3S Traumatic subdural hemorrhage with loss of consciousness of 1 hour to 5 hours 59 minutes, sequela: Secondary | ICD-10-CM | POA: Diagnosis not present

## 2021-06-22 DIAGNOSIS — I5022 Chronic systolic (congestive) heart failure: Secondary | ICD-10-CM | POA: Diagnosis not present

## 2021-06-22 DIAGNOSIS — I69812 Visuospatial deficit and spatial neglect following other cerebrovascular disease: Secondary | ICD-10-CM | POA: Diagnosis not present

## 2021-06-22 DIAGNOSIS — I5032 Chronic diastolic (congestive) heart failure: Secondary | ICD-10-CM | POA: Diagnosis not present

## 2021-06-22 DIAGNOSIS — R41840 Attention and concentration deficit: Secondary | ICD-10-CM | POA: Diagnosis not present

## 2021-06-22 DIAGNOSIS — R278 Other lack of coordination: Secondary | ICD-10-CM | POA: Diagnosis not present

## 2021-06-22 DIAGNOSIS — I69391 Dysphagia following cerebral infarction: Secondary | ICD-10-CM | POA: Diagnosis not present

## 2021-06-22 DIAGNOSIS — I69354 Hemiplegia and hemiparesis following cerebral infarction affecting left non-dominant side: Secondary | ICD-10-CM | POA: Diagnosis not present

## 2021-06-22 DIAGNOSIS — R293 Abnormal posture: Secondary | ICD-10-CM | POA: Diagnosis not present

## 2021-06-22 DIAGNOSIS — H538 Other visual disturbances: Secondary | ICD-10-CM | POA: Diagnosis not present

## 2021-06-22 DIAGNOSIS — R41841 Cognitive communication deficit: Secondary | ICD-10-CM | POA: Diagnosis not present

## 2021-06-22 DIAGNOSIS — G9341 Metabolic encephalopathy: Secondary | ICD-10-CM | POA: Diagnosis not present

## 2021-06-23 ENCOUNTER — Ambulatory Visit (INDEPENDENT_AMBULATORY_CARE_PROVIDER_SITE_OTHER): Payer: Medicare Other | Admitting: Neurology

## 2021-06-23 DIAGNOSIS — R41840 Attention and concentration deficit: Secondary | ICD-10-CM | POA: Diagnosis not present

## 2021-06-23 DIAGNOSIS — G4733 Obstructive sleep apnea (adult) (pediatric): Secondary | ICD-10-CM

## 2021-06-23 DIAGNOSIS — I5032 Chronic diastolic (congestive) heart failure: Secondary | ICD-10-CM | POA: Diagnosis not present

## 2021-06-23 DIAGNOSIS — G4710 Hypersomnia, unspecified: Secondary | ICD-10-CM

## 2021-06-23 DIAGNOSIS — I63412 Cerebral infarction due to embolism of left middle cerebral artery: Secondary | ICD-10-CM

## 2021-06-23 DIAGNOSIS — I6601 Occlusion and stenosis of right middle cerebral artery: Secondary | ICD-10-CM | POA: Diagnosis not present

## 2021-06-23 DIAGNOSIS — R41841 Cognitive communication deficit: Secondary | ICD-10-CM | POA: Diagnosis not present

## 2021-06-23 DIAGNOSIS — I4819 Other persistent atrial fibrillation: Secondary | ICD-10-CM

## 2021-06-23 DIAGNOSIS — H538 Other visual disturbances: Secondary | ICD-10-CM | POA: Diagnosis not present

## 2021-06-23 DIAGNOSIS — I69391 Dysphagia following cerebral infarction: Secondary | ICD-10-CM | POA: Diagnosis not present

## 2021-06-23 DIAGNOSIS — I69812 Visuospatial deficit and spatial neglect following other cerebrovascular disease: Secondary | ICD-10-CM | POA: Diagnosis not present

## 2021-06-23 DIAGNOSIS — G9341 Metabolic encephalopathy: Secondary | ICD-10-CM | POA: Diagnosis not present

## 2021-06-23 DIAGNOSIS — S065X3S Traumatic subdural hemorrhage with loss of consciousness of 1 hour to 5 hours 59 minutes, sequela: Secondary | ICD-10-CM | POA: Diagnosis not present

## 2021-06-23 DIAGNOSIS — I484 Atypical atrial flutter: Secondary | ICD-10-CM

## 2021-06-23 DIAGNOSIS — M6389 Disorders of muscle in diseases classified elsewhere, multiple sites: Secondary | ICD-10-CM | POA: Diagnosis not present

## 2021-06-23 DIAGNOSIS — R278 Other lack of coordination: Secondary | ICD-10-CM | POA: Diagnosis not present

## 2021-06-23 DIAGNOSIS — I69354 Hemiplegia and hemiparesis following cerebral infarction affecting left non-dominant side: Secondary | ICD-10-CM | POA: Diagnosis not present

## 2021-06-23 DIAGNOSIS — I330 Acute and subacute infective endocarditis: Secondary | ICD-10-CM

## 2021-06-23 DIAGNOSIS — I5022 Chronic systolic (congestive) heart failure: Secondary | ICD-10-CM | POA: Diagnosis not present

## 2021-06-23 DIAGNOSIS — R293 Abnormal posture: Secondary | ICD-10-CM | POA: Diagnosis not present

## 2021-06-24 DIAGNOSIS — M6389 Disorders of muscle in diseases classified elsewhere, multiple sites: Secondary | ICD-10-CM | POA: Diagnosis not present

## 2021-06-24 DIAGNOSIS — I6601 Occlusion and stenosis of right middle cerebral artery: Secondary | ICD-10-CM | POA: Diagnosis not present

## 2021-06-24 DIAGNOSIS — R41840 Attention and concentration deficit: Secondary | ICD-10-CM | POA: Diagnosis not present

## 2021-06-24 DIAGNOSIS — R293 Abnormal posture: Secondary | ICD-10-CM | POA: Diagnosis not present

## 2021-06-24 DIAGNOSIS — H538 Other visual disturbances: Secondary | ICD-10-CM | POA: Diagnosis not present

## 2021-06-24 DIAGNOSIS — I69354 Hemiplegia and hemiparesis following cerebral infarction affecting left non-dominant side: Secondary | ICD-10-CM | POA: Diagnosis not present

## 2021-06-24 DIAGNOSIS — I5032 Chronic diastolic (congestive) heart failure: Secondary | ICD-10-CM | POA: Diagnosis not present

## 2021-06-24 DIAGNOSIS — I69812 Visuospatial deficit and spatial neglect following other cerebrovascular disease: Secondary | ICD-10-CM | POA: Diagnosis not present

## 2021-06-24 DIAGNOSIS — S065X3S Traumatic subdural hemorrhage with loss of consciousness of 1 hour to 5 hours 59 minutes, sequela: Secondary | ICD-10-CM | POA: Diagnosis not present

## 2021-06-24 DIAGNOSIS — R41841 Cognitive communication deficit: Secondary | ICD-10-CM | POA: Diagnosis not present

## 2021-06-24 DIAGNOSIS — I69391 Dysphagia following cerebral infarction: Secondary | ICD-10-CM | POA: Diagnosis not present

## 2021-06-24 DIAGNOSIS — I5022 Chronic systolic (congestive) heart failure: Secondary | ICD-10-CM | POA: Diagnosis not present

## 2021-06-24 DIAGNOSIS — R278 Other lack of coordination: Secondary | ICD-10-CM | POA: Diagnosis not present

## 2021-06-24 DIAGNOSIS — G9341 Metabolic encephalopathy: Secondary | ICD-10-CM | POA: Diagnosis not present

## 2021-06-25 DIAGNOSIS — I69354 Hemiplegia and hemiparesis following cerebral infarction affecting left non-dominant side: Secondary | ICD-10-CM | POA: Diagnosis not present

## 2021-06-25 DIAGNOSIS — R278 Other lack of coordination: Secondary | ICD-10-CM | POA: Diagnosis not present

## 2021-06-25 DIAGNOSIS — R41840 Attention and concentration deficit: Secondary | ICD-10-CM | POA: Diagnosis not present

## 2021-06-25 DIAGNOSIS — R41841 Cognitive communication deficit: Secondary | ICD-10-CM | POA: Diagnosis not present

## 2021-06-25 DIAGNOSIS — G9341 Metabolic encephalopathy: Secondary | ICD-10-CM | POA: Diagnosis not present

## 2021-06-25 DIAGNOSIS — I5032 Chronic diastolic (congestive) heart failure: Secondary | ICD-10-CM | POA: Diagnosis not present

## 2021-06-25 DIAGNOSIS — R293 Abnormal posture: Secondary | ICD-10-CM | POA: Diagnosis not present

## 2021-06-25 DIAGNOSIS — M6389 Disorders of muscle in diseases classified elsewhere, multiple sites: Secondary | ICD-10-CM | POA: Diagnosis not present

## 2021-06-25 DIAGNOSIS — I69391 Dysphagia following cerebral infarction: Secondary | ICD-10-CM | POA: Diagnosis not present

## 2021-06-25 DIAGNOSIS — I5022 Chronic systolic (congestive) heart failure: Secondary | ICD-10-CM | POA: Diagnosis not present

## 2021-06-25 DIAGNOSIS — I69812 Visuospatial deficit and spatial neglect following other cerebrovascular disease: Secondary | ICD-10-CM | POA: Diagnosis not present

## 2021-06-25 DIAGNOSIS — I6601 Occlusion and stenosis of right middle cerebral artery: Secondary | ICD-10-CM | POA: Diagnosis not present

## 2021-06-25 DIAGNOSIS — H538 Other visual disturbances: Secondary | ICD-10-CM | POA: Diagnosis not present

## 2021-06-25 DIAGNOSIS — S065X3S Traumatic subdural hemorrhage with loss of consciousness of 1 hour to 5 hours 59 minutes, sequela: Secondary | ICD-10-CM | POA: Diagnosis not present

## 2021-06-28 DIAGNOSIS — H538 Other visual disturbances: Secondary | ICD-10-CM | POA: Diagnosis not present

## 2021-06-28 DIAGNOSIS — R41840 Attention and concentration deficit: Secondary | ICD-10-CM | POA: Diagnosis not present

## 2021-06-28 DIAGNOSIS — I38 Endocarditis, valve unspecified: Secondary | ICD-10-CM | POA: Diagnosis not present

## 2021-06-28 DIAGNOSIS — I5022 Chronic systolic (congestive) heart failure: Secondary | ICD-10-CM | POA: Diagnosis not present

## 2021-06-28 DIAGNOSIS — R293 Abnormal posture: Secondary | ICD-10-CM | POA: Diagnosis not present

## 2021-06-28 DIAGNOSIS — R41841 Cognitive communication deficit: Secondary | ICD-10-CM | POA: Diagnosis not present

## 2021-06-28 DIAGNOSIS — M6389 Disorders of muscle in diseases classified elsewhere, multiple sites: Secondary | ICD-10-CM | POA: Diagnosis not present

## 2021-06-28 DIAGNOSIS — S065X3S Traumatic subdural hemorrhage with loss of consciousness of 1 hour to 5 hours 59 minutes, sequela: Secondary | ICD-10-CM | POA: Diagnosis not present

## 2021-06-28 DIAGNOSIS — I69391 Dysphagia following cerebral infarction: Secondary | ICD-10-CM | POA: Diagnosis not present

## 2021-06-28 DIAGNOSIS — R278 Other lack of coordination: Secondary | ICD-10-CM | POA: Diagnosis not present

## 2021-06-28 DIAGNOSIS — I6601 Occlusion and stenosis of right middle cerebral artery: Secondary | ICD-10-CM | POA: Diagnosis not present

## 2021-06-28 DIAGNOSIS — G9341 Metabolic encephalopathy: Secondary | ICD-10-CM | POA: Diagnosis not present

## 2021-06-28 DIAGNOSIS — I5032 Chronic diastolic (congestive) heart failure: Secondary | ICD-10-CM | POA: Diagnosis not present

## 2021-06-28 DIAGNOSIS — I69812 Visuospatial deficit and spatial neglect following other cerebrovascular disease: Secondary | ICD-10-CM | POA: Diagnosis not present

## 2021-06-28 DIAGNOSIS — I69354 Hemiplegia and hemiparesis following cerebral infarction affecting left non-dominant side: Secondary | ICD-10-CM | POA: Diagnosis not present

## 2021-06-28 LAB — COMPREHENSIVE METABOLIC PANEL: Calcium: 9.5 (ref 8.7–10.7)

## 2021-06-28 LAB — BASIC METABOLIC PANEL
BUN: 20 (ref 4–21)
CO2: 23 — AB (ref 13–22)
Chloride: 106 (ref 99–108)
Creatinine: 0.7 (ref 0.6–1.3)
Glucose: 74
Potassium: 4.3 mEq/L (ref 3.5–5.1)
Sodium: 144 (ref 137–147)

## 2021-06-29 ENCOUNTER — Ambulatory Visit (HOSPITAL_COMMUNITY)
Admission: RE | Admit: 2021-06-29 | Discharge: 2021-06-29 | Disposition: A | Discharge status: Home / Self Care | Payer: Medicare Other | Source: Ambulatory Visit | Attending: Cardiology | Admitting: Cardiology

## 2021-06-29 ENCOUNTER — Encounter (HOSPITAL_COMMUNITY): Payer: Self-pay | Admitting: Cardiology

## 2021-06-29 VITALS — BP 110/82 | HR 77

## 2021-06-29 DIAGNOSIS — M6389 Disorders of muscle in diseases classified elsewhere, multiple sites: Secondary | ICD-10-CM | POA: Diagnosis not present

## 2021-06-29 DIAGNOSIS — I110 Hypertensive heart disease with heart failure: Secondary | ICD-10-CM | POA: Insufficient documentation

## 2021-06-29 DIAGNOSIS — I5032 Chronic diastolic (congestive) heart failure: Secondary | ICD-10-CM | POA: Insufficient documentation

## 2021-06-29 DIAGNOSIS — I69391 Dysphagia following cerebral infarction: Secondary | ICD-10-CM | POA: Diagnosis not present

## 2021-06-29 DIAGNOSIS — Z79899 Other long term (current) drug therapy: Secondary | ICD-10-CM | POA: Diagnosis not present

## 2021-06-29 DIAGNOSIS — Z792 Long term (current) use of antibiotics: Secondary | ICD-10-CM | POA: Diagnosis not present

## 2021-06-29 DIAGNOSIS — I5022 Chronic systolic (congestive) heart failure: Secondary | ICD-10-CM | POA: Diagnosis not present

## 2021-06-29 DIAGNOSIS — R293 Abnormal posture: Secondary | ICD-10-CM | POA: Diagnosis not present

## 2021-06-29 DIAGNOSIS — Z8673 Personal history of transient ischemic attack (TIA), and cerebral infarction without residual deficits: Secondary | ICD-10-CM | POA: Insufficient documentation

## 2021-06-29 DIAGNOSIS — I480 Paroxysmal atrial fibrillation: Secondary | ICD-10-CM | POA: Diagnosis not present

## 2021-06-29 DIAGNOSIS — G9341 Metabolic encephalopathy: Secondary | ICD-10-CM | POA: Diagnosis not present

## 2021-06-29 DIAGNOSIS — Z953 Presence of xenogenic heart valve: Secondary | ICD-10-CM | POA: Diagnosis not present

## 2021-06-29 DIAGNOSIS — I7121 Aneurysm of the ascending aorta, without rupture: Secondary | ICD-10-CM | POA: Diagnosis not present

## 2021-06-29 DIAGNOSIS — Z7901 Long term (current) use of anticoagulants: Secondary | ICD-10-CM | POA: Insufficient documentation

## 2021-06-29 DIAGNOSIS — R41840 Attention and concentration deficit: Secondary | ICD-10-CM | POA: Diagnosis not present

## 2021-06-29 DIAGNOSIS — I059 Rheumatic mitral valve disease, unspecified: Secondary | ICD-10-CM | POA: Diagnosis not present

## 2021-06-29 DIAGNOSIS — I69812 Visuospatial deficit and spatial neglect following other cerebrovascular disease: Secondary | ICD-10-CM | POA: Diagnosis not present

## 2021-06-29 DIAGNOSIS — H538 Other visual disturbances: Secondary | ICD-10-CM | POA: Diagnosis not present

## 2021-06-29 DIAGNOSIS — S065X3S Traumatic subdural hemorrhage with loss of consciousness of 1 hour to 5 hours 59 minutes, sequela: Secondary | ICD-10-CM | POA: Diagnosis not present

## 2021-06-29 DIAGNOSIS — I69354 Hemiplegia and hemiparesis following cerebral infarction affecting left non-dominant side: Secondary | ICD-10-CM | POA: Diagnosis not present

## 2021-06-29 DIAGNOSIS — E785 Hyperlipidemia, unspecified: Secondary | ICD-10-CM | POA: Insufficient documentation

## 2021-06-29 DIAGNOSIS — R41841 Cognitive communication deficit: Secondary | ICD-10-CM | POA: Diagnosis not present

## 2021-06-29 DIAGNOSIS — I6601 Occlusion and stenosis of right middle cerebral artery: Secondary | ICD-10-CM | POA: Diagnosis not present

## 2021-06-29 DIAGNOSIS — R278 Other lack of coordination: Secondary | ICD-10-CM | POA: Diagnosis not present

## 2021-06-29 NOTE — Progress Notes (Signed)
?  ? ? ?Date:  06/29/2021  ? ?ID:  [name] [name], DOB 01/07/1941, MRN [id number]    ?Provider location: [place] [facility] ?Type of Visit: Established patient ? ?PCP:  [name], MD  ?Cardiologist:  Dr. [name] ?  ?History of Present Illness: ?[name] [name] is a 81 y.o. male who has a history of rheumatic fever and AI/[name] s/p bioprosthetic AVR and MVR at the [facility] as well as MAZE procedure all in 2006.  He also has an ascending aortic aneurysm.   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  I started him on dronedarone and coumadin with plan for cardioversion after 4 wks of therapeutic anticoagulation if he did not convert back to NSR on his own.  He went back into NSR spontaneously and later stopped coumadin. Atrial flutter recurred in 3/15 and again resolved spontaneously. ?  ?In 6/15, patient was admitted with transient dysarthria (resolved in about 10 minutes).  However, he was found to have evidence for CVA by MRI.  Therefore, he was started on coumadin. He was noted to be in atypical atrial flutter again.  Ranolazine was added to dronedarone.   ?  ?In 8/15, [name] [name] was seen in consultation at the [facility] clinic.  He had atrial flutter ablation at [facility] in 11/15.   ?  ?He was seen in followup at [facility] in 2/16.  Ranolazine was stopped.  Echo in 2/16 showed EF 55% with stable bioprosthetic mitral and aortic valves.  CTA chest showed stable 5 cm ascending aortic aneurysm.  He went back into atypical atrial flutter in 4/16.  Ranolazine was restarted and he was cardioverted back to NSR.   ?   ?Cardiolite in 12/18 showed no ischemia. He wore a 30 day monitor in 12/18 with no arrhythmia.   ?  ?He went into atrial flutter again during a respiratory illness in 2/19.  On 04/24/17, he was cardioverted back to NSR.  He thinks that he went into atrial flutter again later on that day. He was back in atrial flutter at followup  appointment. He was then admitted for Tikosyn loading in 4/19 and was cardioverted back to NSR after the load.  ?  ?He had atrial fibrillation again in 6/19 with DCCV.  ?  ?He was seen at the [facility] in 9/19.  Echo showed normal LV and RV systolic function, bioprosthetic MV with mean gradient up to 9 mmHg, normal bioprosthetic AoV.  CTA chest showed aortic root up to 5.3 cm.  ?  ?He was back in atrial flutter in 12/19, had DCCV back to NSR.  Echo in 12/19 showed EF 60-65%, normal bioprosthetic aortic valve, bioprosthetic mitral valve with mean gradient 4 mmHg and no [name], severe LAE, aortic root 4.8 cm.  ? ?He went to the [facility] in 3/20.  Echo at that time showed EF 60-65% with stable bioprosthetic mitral and aortic valves (mean gradient 7 mmHg across MV and 9 mmHg across AoV).  CTA showed 5.0 cm aortic root and ascending aorta, stable. ? ?He returned to the [facility] in 7/20.  He had atrial fibrillation/flutter ablation again and thinks he has been in NSR since that time.  CTA chest showed 5.1 cm ascending thoracic aorta. Seen at [facility] in 7/21.  Echo showed stable bioprosthetic mitral and aortic valves with normal EF.  CTA chest showed 5.0 cm ascending aortic aneurysm.  ? ?Last visit  at [facility] clinic 1/22 he was doing well, exercising and euvolemic in NSR. ? ?He suffered SDH-s/p right craniotomy in May 2022. He subsequently developed right MCA territory stroke (as Eliquis was held) in June 2022, requiring thrombectomy and was discharged to SNF with significant disability.   ? 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 Initial echo showed EF 65-70%, no obvious vegetation. Cards consulted and TEE showed bioprosthetic MV vegetation with trivial [name] and mean gradient 8.  He is not a candidate for mitral valve  replacement.  ID consulted he was started on Rocephin/ampicillin via PICC with a stop date of 01/28/21 then continue on amoxicillin 500 mg bid indefinitely. Hospital course complicated by prolonged encephalopathy.  Neurology consulted and MRI brian showed new left cerebellar vermis CVA, likely septic emboli in the setting of mitral valve endocarditis.  He was discharged back to SNF, weight 187 lbs. ? ?He was readmitted in 5/22 with volume overload and was diuresed. He had transient atrial fibrillation but went back into NSR.  ? ?Echo in 1/23 on my review showed  EF 50%, mild-moderate LVH, mildly decreased RV systolic function, bioprosthetic mitral valve appears to have vegetation present with mild [name], mean gradient 8 mmHg (mild-moderate bioprosthetic valve stenosis), bioprosthetic aortic valve with mean gradient 9 mmHg and no AI, the bioprosthetic aortic valve appears thickened, 5.1 cm ascending aorta.  Blood cultures were done and showed now growth.  ESR and CRP were normal.  It was suspected that the vegetation noted was sterile.  ? ?He returns today for followup of diastolic CHF and mitral valve endocarditis.  He seems to be fairly stable.  Per his wife, he is more interactive, but he does not interact with me today. He is awake.  His wife says that he has been able to stand up in place and will occasionally talk.  He is in NSR today.  ? ?ECG (personally reviewed): NSR, 1st degree AVB, QTc 445 msec ?  ?Labs (3/14): LDL 64, TSH normal, K 3.9, creatinine 1.0     ?Labs (6/15): LDL 54, HDL 48, creatinine 0.95   ?Labs (9/15): LDL 54, HDL 25 ?Labs (10/15): K 4.5, creatinine 1.3, AST 45, ALT 64 ?Labs (4/16): K 4.3, creatinine 1.28, HCT 40.7 ?Labs (5/17): K 4.1, creatinine 1.16, LDL 66, HDL 47 ?Labs (2/18): LDL 42, HDL 43, K 4.4, creatinine 1.48 ?Labs (3/18): hgb 13.8 ?Labs (2/19): LDL 59 ?Labs (4/19): K 3.9, creatinine 0.97, Mg 2.0 ?Labs (6/19): K 4.4, creatinine 0.9 ?Labs (10/19): LDL 84 ?Labs (12/19): K 4.2,  creatinine 1.13, hgb 13.9 ?Labs (3/20): K 4.1, creatinine 0.98 ?Labs (6/20): K 4.1, creatinine 0.91, LDL 103  ?Labs (9/20): K 4, creatinine 0.95 ?Labs (1/21): K 4, creatinine 0.94, LDL 80 ?Labs (5/21): K 4.3, creatinine 0.92 ?Labs (11/22): K 3.2, creatinine 0.6 => 0.88, WBCs 13, ESR 70 ?Labs (1/23): hgb 10.2, K 4, creatinine 0.6, AST/ALT normal ?Labs (3/23): ESR 11, CRP 2.7 ?Labs (4/23): K 4.3, creatinine 0.68 ?                                                                                                                             ?  PMH: ?1. Atrial fibrillation: s/p MAZE and LAA ligation in 2006.  No documented atrial fibrillation since operation.  ?2. Atypical atrial flutter: 10/13, 3/15, and again in 6/15.  Recurrent 4/16 with DCCV.  ?- Event monitor (12/18): no significant arrhythmia.  ?- Recurrence in 2/19: DCCV to NSR but quickly recurred.  ?- Admitted in 4/19 for Tikosyn loading and cardioverted to NSR.  ?- DCCV to NSR in 6/19.  ?- DCCV to NSR in 12/19. ?- 7/20 redo atrial flutter/fibrillation ablation.   ?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   ?- Echo 5/11 with EF 60-65%, normal bioprosthetic aortic valve, normal bioprosthetic mitral valve, moderate LAE.   ?- Echo 11/12 at [facility] looked ok per report.  ?- Echo (10/13) with EF 55-60%, mild LVH, normal bioprosthetic aortic and mitral valves, moderate LAE, PA systolic pressure 35 mmHg.   ?- Echo (6/15) with EF 60%, moderate LVH, normal-appearing bioprosthetic MV and AoV, PA systolic pressure 32 mmHg.   ?- Echo 9/15 [facility]) with EF 55%, mild LVH, PASP 52 mmHg, severe LAE, bioprosthetic mitral valve with no [name] and mean gradient 11 mmHg, moderate TR, bioprosthetic aortic valve with no AI and mean gradient 9 mmHg, dilated ascending aorta to 5 cm.   ?- Echo (2/16, [facility]) with EF 55%, stable bioprosthetic aortic and mitral valves (AoV mean gradient 7 mmHg, MV mean  gradient 4 mmHg), normal RV size and systolic function.  ?- Echo (12/16, [facility]) with EF 58%, mild RV dilation with low normal systolic function, PA systolic pressure 34 mmHg, bioprosthetic MV and AoV functio

## 2021-06-29 NOTE — Patient Instructions (Signed)
Please call the office in August to schedule a follow up appointment in 6 months (October) with Dr. [name].  ? ?If you have any questions, issues, or concerns before your next appointment please call our office at [phone], opt. 2 and leave a message for the triage nurse. ? ? ?

## 2021-06-29 NOTE — Progress Notes (Signed)
? ? ? ? ?  ?  ?[facility] ?  ?HOME SLEEP TEST REPORT ( by Watch PAT)   ?STUDY DATA were available on 06-29-2021:   ? ?  ?ORDERING CLINICIAN: Larey Seat, MD  ?REFERRING CLINICIAN: Pramod Sethi,MD ?PCP: Veleta Miners, MD ?  ?CLINICAL INFORMATION/HISTORY:Caucasian male patient seen in consultation requested by Stroke ,MD on 06/09/2021 . ?  ?Mr. Wafer has a history spanning from May 2022 to now marked by repeated and very impairing strokes.  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  He had a normal ejection fraction but severely dilated left atrium. 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   ?The recovery progress seem to have been limited somewhat by bouts of lethargy, there are cognitive deficits and especially communication deficits.  He is now in a nursing facility for progressive therapy.   ?He resides at [facility].  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   ? ?[place] the [place]iput on Jul 26, 2020 and in the emergency room there was initially no acute finding was noted , but the day after he returned with difficulty speaking and now with right-sided weakness. A SDH had developed.  Dr. Ronnald Ramp evacuated the left-sided traumatic subdural hemorrhage that had caused mass effect and subfalcine herniation. ?  ?He is described as lethargic, sleepy, and with cognitive and motor function limitation, he presented in a wheelchair recliner.  ? ?  ?Epworth sleepiness score: 18/24.  ?BMI:22 kg/m?  ?Neck Circumference: 16" ?  ?FINDINGS: ? Sleep Summary: ?  ?Total Recording  Time (hours, min): 9 hours and 21 minutes of which 8 hours and 18 minutes was a total recorded sleep time with 17.3% REM sleep.  The recording took place between 9:41 PM and 7:02 AM     ?  ?Respiratory Indices: ?  ?Calculated pAHI (per hour):   57.8/h                        ?  ?REM pAHI: 54.3/h                                             ?  ?NREM pAHI:    58.5/h                        ?  ?Positional AHI: All sleep was seen in supine position. ? ?Snoring data indicate a mean volume of 41 dB with about 30% of the total sleep time accompanied by snoring /mild to moderate.                                               ?  ?Oxygen Saturation Statistics: ?  ?O2 Saturation Range (%): 71% to 100%, mean saturation at 95% . Desaturations were frequent and brief- not sustained.                                 ?  ?  O2 Saturation (minutes) <89%:   0.9% or 4.3 minutes.       ?  ?Pulse Rate Statistics: ?  ?Pulse Mean (bpm):    77           ?  ?Pulse Range  between 43 and 126 bpm.   ? ? ?  ?IMPRESSION:  Dear Dr. [name],  ?This HST confirms severe sleep apnea to be present with a high variability in heart rate.  There was no significant hypoxia seen. Sleep was the least interrupted between 22.00 hours and 4 AM.          ?  ?RECOMMENDATION: I will offer an autotitration CPAP trial, while I am very much aware of the barriers to apnea care arising from cognitive and motor deficits.  ?This patient will need assistance with placing the CPAP interface and switching the device on as well as taking it off in AM.  ?I am also not convinced that sleep apnea treatment will influence his sleepiness greatly, given the underlying cerebral insult and heart condition as well as medication.  ? ?PS : Should PAP therapy fail, we may need to accept that no satisfying treatment modality exists for patients in his situation.  ? ?  ?INTERPRETING PHYSICIAN: ?Larey Seat, MD  ?Medical Director of [employer] at [employer].   ? ? ? ? ? ? ? ? ? ? ? ? ? ? ?

## 2021-06-30 DIAGNOSIS — R278 Other lack of coordination: Secondary | ICD-10-CM | POA: Diagnosis not present

## 2021-06-30 DIAGNOSIS — I5022 Chronic systolic (congestive) heart failure: Secondary | ICD-10-CM | POA: Diagnosis not present

## 2021-06-30 DIAGNOSIS — R41841 Cognitive communication deficit: Secondary | ICD-10-CM | POA: Diagnosis not present

## 2021-06-30 DIAGNOSIS — H538 Other visual disturbances: Secondary | ICD-10-CM | POA: Diagnosis not present

## 2021-06-30 DIAGNOSIS — R293 Abnormal posture: Secondary | ICD-10-CM | POA: Diagnosis not present

## 2021-06-30 DIAGNOSIS — S065X3S Traumatic subdural hemorrhage with loss of consciousness of 1 hour to 5 hours 59 minutes, sequela: Secondary | ICD-10-CM | POA: Diagnosis not present

## 2021-06-30 DIAGNOSIS — I69354 Hemiplegia and hemiparesis following cerebral infarction affecting left non-dominant side: Secondary | ICD-10-CM | POA: Diagnosis not present

## 2021-06-30 DIAGNOSIS — R41840 Attention and concentration deficit: Secondary | ICD-10-CM | POA: Diagnosis not present

## 2021-06-30 DIAGNOSIS — I6601 Occlusion and stenosis of right middle cerebral artery: Secondary | ICD-10-CM | POA: Diagnosis not present

## 2021-06-30 DIAGNOSIS — G9341 Metabolic encephalopathy: Secondary | ICD-10-CM | POA: Diagnosis not present

## 2021-06-30 DIAGNOSIS — I5032 Chronic diastolic (congestive) heart failure: Secondary | ICD-10-CM | POA: Diagnosis not present

## 2021-06-30 DIAGNOSIS — I69391 Dysphagia following cerebral infarction: Secondary | ICD-10-CM | POA: Diagnosis not present

## 2021-06-30 DIAGNOSIS — M6389 Disorders of muscle in diseases classified elsewhere, multiple sites: Secondary | ICD-10-CM | POA: Diagnosis not present

## 2021-06-30 DIAGNOSIS — I69812 Visuospatial deficit and spatial neglect following other cerebrovascular disease: Secondary | ICD-10-CM | POA: Diagnosis not present

## 2021-06-30 NOTE — Procedures (Signed)
?  ?  ?[facility] ?  ?HOME SLEEP TEST REPORT ( by Watch PAT)   ?STUDY DATA were available on 06-29-2021:   ? ?  ?ORDERING CLINICIAN: Larey Seat, MD  ?REFERRING CLINICIAN: Pramod Sethi,MD ?PCP: Veleta Miners, MD ?  ?CLINICAL INFORMATION/HISTORY:Caucasian male patient seen in consultation requested by Stroke ,MD on 06/09/2021 . ?  ?[name] [name] has a history spanning from May 2022 to now marked by repeated and very impairing strokes.  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  He had a normal ejection fraction but severely dilated left atrium. 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   ?The recovery progress seem to have been limited somewhat by bouts of lethargy, there are cognitive deficits and especially communication deficits.  He is now in a nursing facility for progressive therapy.   ?He resides at [facility].  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   ? ?He had fallen at church backwards striking the occiput on Jul 26, 2020 and in the emergency room there was initially no acute finding was noted , but the day after he returned with difficulty speaking and now with right-sided weakness. A SDH had developed.  Dr. Ronnald Ramp evacuated the left-sided traumatic subdural hemorrhage that had caused mass effect and subfalcine herniation. ?  ?He is described as lethargic, sleepy, and with cognitive and motor function limitation, he presented in a wheelchair recliner.  ? ?  ?Epworth sleepiness score: 18/24.  ?BMI:22 kg/m?  ?Neck Circumference: 16" ?  ?FINDINGS: ? Sleep Summary: ?  ?Total Recording Time  (hours, min): 9 hours and 21 minutes of which 8 hours and 18 minutes was a total recorded sleep time with 17.3% REM sleep.  The recording took place between 9:41 PM and 7:02 AM     ?  ?Respiratory Indices: ?  ?Calculated pAHI (per hour):   57.8/h                        ?  ?REM pAHI: 54.3/h                                             ?  ?NREM pAHI:    58.5/h                        ?  ?Positional AHI: All sleep was seen in supine position. ? ?Snoring data indicate a mean volume of 41 dB with about 30% of the total sleep time accompanied by snoring /mild to moderate.                                               ?  ?Oxygen Saturation Statistics: ?  ?O2 Saturation Range (%): 71% to 100%, mean saturation at 95% . Desaturations were frequent and brief- not sustained.                                 ?  ?  O2 Saturation (minutes) <89%:   0.9% or 4.3 minutes.       ?  ?Pulse Rate Statistics: ?  ?Pulse Mean (bpm):    77           ?  ?Pulse Range  between 43 and 126 bpm.   ? ? ?  ?IMPRESSION:  Dear Dr. [name],  ?This HST confirms severe sleep apnea to be present with a high variability in heart rate.  There was no significant hypoxia seen. Sleep was the least interrupted between 22.00 hours and 4 AM.          ?  ?RECOMMENDATION: I will offer an autotitration CPAP trial, while I am very much aware of the barriers to apnea care arising from cognitive and motor deficits.  ?This patient will need assistance with placing the CPAP interface and switching the device on as well as taking it off in AM.  ?I am also not convinced that sleep apnea treatment will influence his sleepiness greatly, given the underlying cerebral insult and heart condition as well as medication.  ? ?PS : Should PAP therapy fail, we may need to accept that no satisfying treatment modality exists for patients in his situation.  ? ?  ?INTERPRETING PHYSICIAN: ?Larey Seat, MD  ?Medical Director of [employer] at [employer].   ? ? ? ? ? ? ? ? ? ? ? ? ? ? ?

## 2021-06-30 NOTE — Addendum Note (Signed)
Addended by: Larey Seat on: 06/30/2021 05:38 PM ? ? Modules accepted: Orders ? ?

## 2021-07-01 ENCOUNTER — Encounter: Payer: Self-pay | Admitting: *Deleted

## 2021-07-01 ENCOUNTER — Telehealth: Payer: Self-pay | Admitting: *Deleted

## 2021-07-01 ENCOUNTER — Non-Acute Institutional Stay (SKILLED_NURSING_FACILITY): Payer: Medicare Other | Admitting: Adult Health

## 2021-07-01 ENCOUNTER — Encounter: Payer: Self-pay | Admitting: Adult Health

## 2021-07-01 DIAGNOSIS — D638 Anemia in other chronic diseases classified elsewhere: Secondary | ICD-10-CM

## 2021-07-01 DIAGNOSIS — I4819 Other persistent atrial fibrillation: Secondary | ICD-10-CM

## 2021-07-01 DIAGNOSIS — M6389 Disorders of muscle in diseases classified elsewhere, multiple sites: Secondary | ICD-10-CM | POA: Diagnosis not present

## 2021-07-01 DIAGNOSIS — K5901 Slow transit constipation: Secondary | ICD-10-CM | POA: Diagnosis not present

## 2021-07-01 DIAGNOSIS — R293 Abnormal posture: Secondary | ICD-10-CM | POA: Diagnosis not present

## 2021-07-01 DIAGNOSIS — I38 Endocarditis, valve unspecified: Secondary | ICD-10-CM

## 2021-07-01 DIAGNOSIS — I5032 Chronic diastolic (congestive) heart failure: Secondary | ICD-10-CM | POA: Diagnosis not present

## 2021-07-01 DIAGNOSIS — I6601 Occlusion and stenosis of right middle cerebral artery: Secondary | ICD-10-CM | POA: Diagnosis not present

## 2021-07-01 DIAGNOSIS — I69354 Hemiplegia and hemiparesis following cerebral infarction affecting left non-dominant side: Secondary | ICD-10-CM | POA: Diagnosis not present

## 2021-07-01 DIAGNOSIS — R41841 Cognitive communication deficit: Secondary | ICD-10-CM | POA: Diagnosis not present

## 2021-07-01 DIAGNOSIS — E118 Type 2 diabetes mellitus with unspecified complications: Secondary | ICD-10-CM | POA: Diagnosis not present

## 2021-07-01 DIAGNOSIS — E782 Mixed hyperlipidemia: Secondary | ICD-10-CM | POA: Diagnosis not present

## 2021-07-01 DIAGNOSIS — S065X3S Traumatic subdural hemorrhage with loss of consciousness of 1 hour to 5 hours 59 minutes, sequela: Secondary | ICD-10-CM | POA: Diagnosis not present

## 2021-07-01 DIAGNOSIS — R278 Other lack of coordination: Secondary | ICD-10-CM | POA: Diagnosis not present

## 2021-07-01 DIAGNOSIS — T826XXD Infection and inflammatory reaction due to cardiac valve prosthesis, subsequent encounter: Secondary | ICD-10-CM

## 2021-07-01 DIAGNOSIS — N400 Enlarged prostate without lower urinary tract symptoms: Secondary | ICD-10-CM | POA: Diagnosis not present

## 2021-07-01 DIAGNOSIS — I5022 Chronic systolic (congestive) heart failure: Secondary | ICD-10-CM | POA: Diagnosis not present

## 2021-07-01 DIAGNOSIS — I69812 Visuospatial deficit and spatial neglect following other cerebrovascular disease: Secondary | ICD-10-CM | POA: Diagnosis not present

## 2021-07-01 DIAGNOSIS — R41840 Attention and concentration deficit: Secondary | ICD-10-CM | POA: Diagnosis not present

## 2021-07-01 DIAGNOSIS — I69391 Dysphagia following cerebral infarction: Secondary | ICD-10-CM | POA: Diagnosis not present

## 2021-07-01 DIAGNOSIS — H538 Other visual disturbances: Secondary | ICD-10-CM | POA: Diagnosis not present

## 2021-07-01 DIAGNOSIS — G9341 Metabolic encephalopathy: Secondary | ICD-10-CM | POA: Diagnosis not present

## 2021-07-01 MED ORDER — POLYETHYLENE GLYCOL 3350 17 G PO PACK: 17.0000 g | PACK | ORAL | Status: DC

## 2021-07-01 NOTE — Progress Notes (Signed)
?Location:  [place] ?  ?Place of Service:  SNF (31) ?Provider:   ?Cindi Carbon, ANP ?[place] ?[phone] ? ? ?[name], MD ? ?Patient Care Team: ?[name], MD as PCP - General (Internal Medicine) ?Larey Dresser, MD as PCP - Cardiology (Cardiology) ?[name], MD as Consulting Physician (Ophthalmology) ?[name], MD (Internal Medicine) ? ?Extended Emergency Contact Information ?Primary Emergency Contact: Agredano,Agnes S ?Address: [address] ?         [place], [place] [address] [place] ?Home Phone: [phone] ?Mobile Phone: [phone] ?Relation: Spouse ?Secondary Emergency Contact: Sponsel,Joseph ?         [place], [place] [address] [place] ?Home Phone: [phone] ?Relation: Son ? ?Code Status:  DNR ?Goals of care: Advanced Directive information ? ?  06/02/2021  ?  2:08 PM  ?Advanced Directives  ?Does Patient Have a Medical Advance Directive? Yes  ?Type of Paramedic of [place];Living will  ? ? ? ?Chief Complaint  ?Patient presents with  ? Medical Management of Chronic Issues  ? ? ?HPI:  ?Pt is a 81 y.o. male seen today for medical management of chronic diseases.   ?PMH includes syncope with fall in May of 2022, TBI with SDH with craniotomy, acute right MCA s/p thrombectomy, bioprosthetic mitral and aortic valve replacement, DM II, diastiolic CHF, BPH, and afib. 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 Also had another CVA with septic emboli. Blood cx grew enterococcus faecalis and urine culture grew staph aureus. Treated with IV antibiotics and now  he will take amoxicillin 500 mg bid indefinitely. He had a f/u echo in Jan 1/23 which did show vegetation on the mitral valve but cardiology felt that it was sterile. Subsequent blood cx negative.  ? ?Remains on D3 diet for dysphagia and tolerating well. Has residual left sided weakness and  wears a brace on the left arm. Also uses a hoyer lift for transfers. He is voiding, has not needed catheterization. Weights are stable. ? ?Saw Dr. Naaman Plummer and is going to try nuvigil when approved by insurance. Also going to try Botox for his left arm spasticity at his next apt. Currently on amantadine and has issues with lethargy. ?Saw neurology and had at home sleep study indicating severe sleep apnea without hypoxia. Results were reported 07/01/2021. Waiting on insurance to start cpap.  ?Past Medical History:  ?Diagnosis Date  ? Acute rheumatic heart disease, unspecified   ? childhood, age  48 & 19  ? Acute rheumatic pericarditis   ? Atrial fibrillation ([place])   ? history  ? CHF (congestive heart failure) ([place])   ? Diverticulosis   ? Dysrhythmia   ? 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 External hemorrhoids without mention of complication   ? H/O aortic valve replacement   ? H/O mitral valve replacement   ? Lesion of ulnar nerve   ? injury / left arm  ? Lesion of ulnar nerve   ? 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 Other and unspecified hyperlipidemia   ? Pre-diabetes   ? Previous back surgery 1978, jan 2007  ? Psychosexual dysfunction with inhibited sexual excitement   ? SOB (shortness of breath)   ? "with heavy exercise"  ? Stroke ([place]) 08/2013  ? "I WAS IN AFIB AND THREW A CLOT, THE EFFECTS WERE TRANSITORY AND DIDNT LAST BUT FOR 30 MINUTES"   ? Thoracic aortic aneurysm (  [place])   ? ?Past Surgical History:  ?Procedure Laterality Date  ? AORTIC AND MITRAL VALVE REPLACEMENT    ? 09/2004  ? CARDIOVERSION    ? 3 times from 2004-2006  ? CARDIOVERSION N/A 09/26/2013  ? Procedure: CARDIOVERSION;  Surgeon: Larey Dresser, MD;  Location: [place];  Service: Cardiovascular;  Laterality: N/A;  ? CARDIOVERSION N/A 06/19/2014  ? Procedure: CARDIOVERSION;  Surgeon: Jerline Pain, MD;  Location: [place];  Service: Cardiovascular;  Laterality: N/A;  ? CARDIOVERSION N/A 04/24/2017  ? Procedure: CARDIOVERSION;  Surgeon:  Larey Dresser, MD;  Location: [facility] ENDOSCOPY;  Service: Cardiovascular;  Laterality: N/A;  ? CARDIOVERSION N/A 06/08/2017  ? Procedure: CARDIOVERSION;  Surgeon: Pixie Casino, MD;  Location: [place];  Service: Cardiovascular;  Laterality: N/A;  ? CARDIOVERSION N/A 08/24/2017  ? Procedure: CARDIOVERSION;  Surgeon: Skeet Latch, MD;  Location: [place];  Service: Cardiovascular;  Laterality: N/A;  ? CARDIOVERSION N/A 02/14/2018  ? Procedure: CARDIOVERSION;  Surgeon: Larey Dresser, MD;  Location: [facility] ENDOSCOPY;  Service: Cardiovascular;  Laterality: N/A;  ? COLONOSCOPY    ? CRANIOTOMY N/A 07/28/2020  ? Procedure: CRANIOTOMY FOR EVACUATION OF SUBDURAL HEMATOMA;  Surgeon: Eustace Vigil, MD;  Location: [place];  Service: Neurosurgery;  Laterality: N/A;  ? IR ANGIO VERTEBRAL SEL SUBCLAVIAN INNOMINATE UNI R MOD SED  08/07/2020  ? IR CT HEAD LTD  08/07/2020  ? IR PERCUTANEOUS ART THROMBECTOMY/INFUSION INTRACRANIAL INC DIAG ANGIO  08/07/2020  ? laminectomies    ? 10/1975 and in 03/2005  ? RADIOLOGY WITH ANESTHESIA N/A 08/07/2020  ? Procedure: IR WITH ANESTHESIA;  Surgeon: Luanne Bras, MD;  Location: [place];  Service: Radiology;  Laterality: N/A;  ? TEE WITHOUT CARDIOVERSION N/A 12/18/2020  ? Procedure: TRANSESOPHAGEAL ECHOCARDIOGRAM (TEE);  Surgeon: Larey Dresser, MD;  Location: [facility] ENDOSCOPY;  Service: Cardiovascular;  Laterality: N/A;  ? TONSILLECTOMY    ? 1950  ? TOTAL HIP ARTHROPLASTY Right 04/03/2018  ? Procedure: TOTAL HIP ARTHROPLASTY ANTERIOR APPROACH;  Surgeon: Paralee Cancel, MD;  Location: [facility];  Service: Orthopedics;  Laterality: Right;  70 mins  ? ? ?Allergies  ?Allergen Reactions  ? Naproxen Hives  ? No [employer] Food Allergies Other (See Comments)  ?  Scallops - distress, nausea and vomitting  ? ? ?Outpatient Encounter Medications as of 07/01/2021  ?Medication Sig  ? amantadine (SYMMETREL) 100 MG capsule Take 100 mg by mouth 2 (two) times daily. Between 8-11 am and 12-3 pm  ? amLODipine  (NORVASC) 5 MG tablet Take 5 mg by mouth daily.  ? amoxicillin (AMOXIL) 400 MG/5ML suspension Take 500 mg by mouth 2 (two) times daily.  ? apixaban (ELIQUIS) 5 MG TABS tablet Take 1 tablet (5 mg total) by mouth 2 (two) times daily.  ? Armodafinil 50 MG tablet Take 2 tablets (100 mg total) by mouth daily. (Patient not taking: Reported on 06/29/2021)  ? Artificial Saliva (BIOTENE MOISTURIZING MOUTH MT) Use as directed 2 sprays in the mouth or throat in the morning, at noon, and at bedtime.  ? atorvastatin (LIPITOR) 80 MG tablet Take 1 tablet (80 mg total) by mouth daily.  ? bisacodyl (DULCOLAX) 10 MG suppository Place 1 suppository (10 mg total) rectally daily as needed for moderate constipation or mild constipation.  ? clobetasol cream (TEMOVATE) 6.27 % Apply 1 application topically daily as needed (for skin irritation).   ? dofetilide (TIKOSYN) 125 MCG capsule Take 3 capsules (375 mcg total) by mouth 2 (two) times daily.  ?  famotidine (PEPCID) 20 MG tablet Take 20 mg by mouth at bedtime as needed for indigestion.  ? finasteride (PROSCAR) 5 MG tablet Take 1 tablet (5 mg total) by mouth daily.  ? Glucerna (GLUCERNA) LIQD Take 237 mLs by mouth daily.  ? hydrocortisone cream 1 % Apply topically 2 (two) times daily as needed for itching (rash).  ? lacosamide (VIMPAT) 50 MG TABS tablet Take 1 tablet (50 mg total) by mouth 2 (two) times daily.  ? levalbuterol (XOPENEX) 0.63 MG/3ML nebulizer solution Take 0.63 mg by nebulization every 6 (six) hours as needed for wheezing or shortness of breath.  ? magnesium gluconate (MAGONATE) 500 MG tablet Take 1 tablet (500 mg total) by mouth daily.  ? melatonin 5 MG TABS Take 5 mg by mouth at bedtime.  ? methenamine (HIPREX) 1 g tablet Take 1 g by mouth every 12 (twelve) hours.  ? Multiple Vitamin (MULTIVITAMIN WITH MINERALS) TABS tablet Take 1 tablet by mouth daily.  ? Polyethyl Glycol-Propyl Glycol (SYSTANE) 0.4-0.3 % SOLN Place 2 drops into both eyes daily.  ? polyethylene glycol  (MIRALAX / GLYCOLAX) 17 g packet Take 17 g by mouth every other day.  ? potassium chloride (KLOR-CON M) 10 MEQ tablet Take 1 tablet (10 mEq total) by mouth daily.  ? senna-docusate (SENOKOT-S) 8.6-50 MG tablet Take

## 2021-07-01 NOTE — Telephone Encounter (Signed)
-----   Message from Larey Seat, MD sent at 06/30/2021  5:38 PM EDT ----- ?IMPRESSION:  Dear Dr. [name],  ?This HST confirms severe sleep apnea to be present with a high variability in heart rate.  There was no significant hypoxia seen. Sleep was the least interrupted between 22.00 hours and 4 AM. ???????? ?? ?RECOMMENDATION:  ? ?Auto CPAP 5-15 cm water,2 cm EPR, heated humidification and nasal pillow or cradle mask with chin strap.  ?I will offer an autotitration CPAP trial while I am very much aware of the barriers to apnea care arising from cognitive and motor deficits.  ?This patient will need assistance with placing the CPAP interface and switching the device on as well as taking it off in AM.  ?I am also not convinced that sleep apnea treatment will influence his sleepiness greatly, given the underlying cerebral insult and heart condition as well as medication.  ?? ?PS : Should PAP therapy fail, we may need to accept that no satisfying treatment modality exists for patients in his situation.  ?? ?? ?

## 2021-07-01 NOTE — Telephone Encounter (Signed)
Called and spoke with pt wife about results. I advised Dr. Brett Fairy reviewed husbands sleep study results and found pt has severe sleep apnea. Dr. Brett Fairy recommends that pt start CPAP. I reviewed PAP compliance expectations with the wife. Agreeable to starting a CPAP. I advised an order will be sent to a DME, [facility], and [facility] will call within about one week after they file with the pt's insurance. [facility] will show the pt how to use the machine, fit for masks, and troubleshoot the CPAP if needed. A follow up appt was made for insurance purposes with Dr. Brett Fairy on 09/20/21 at 10:30am. She verbalized understanding to arrive 15 minutes early and bring their CPAP. He has transportation that gets set up at [facility]. A letter with all of this information in it will be mailed to the wife as a reminder. I verified with the wife that the address we have on file is correct. She verbalized understanding of results. Wife had no questions at this time but was encouraged to call back if questions arise. I have sent the order to [facility] and have received confirmation that they have received the order. ? ? ?She would like a call back once we hear from [facility] as to whether they can get him set up with CPAP or not. ? ?

## 2021-07-02 ENCOUNTER — Telehealth: Payer: Self-pay | Admitting: Adult Health

## 2021-07-02 DIAGNOSIS — I69391 Dysphagia following cerebral infarction: Secondary | ICD-10-CM | POA: Diagnosis not present

## 2021-07-02 DIAGNOSIS — I69354 Hemiplegia and hemiparesis following cerebral infarction affecting left non-dominant side: Secondary | ICD-10-CM | POA: Diagnosis not present

## 2021-07-02 DIAGNOSIS — R41841 Cognitive communication deficit: Secondary | ICD-10-CM | POA: Diagnosis not present

## 2021-07-02 DIAGNOSIS — I5032 Chronic diastolic (congestive) heart failure: Secondary | ICD-10-CM | POA: Diagnosis not present

## 2021-07-02 DIAGNOSIS — U071 COVID-19: Secondary | ICD-10-CM

## 2021-07-02 DIAGNOSIS — I6601 Occlusion and stenosis of right middle cerebral artery: Secondary | ICD-10-CM | POA: Diagnosis not present

## 2021-07-02 MED ORDER — MOLNUPIRAVIR EUA 200MG CAPSULE: 4.0000 | ORAL_CAPSULE | Freq: Two times a day (BID) | ORAL | 0 refills | Status: AC

## 2021-07-02 NOTE — Telephone Encounter (Signed)
Nurse reports Rapid covid swab positive for Covid. Symptoms are dry cough. No decreased 02 sats or sob. Pt is high risk. Sent request for pharmacy to review paxlovid. Concern for interaction with eliquis and tikosyn. Will order molnupiravir instead. Staff to place resident on isolation and monitor vitals signs/status.  ?

## 2021-07-05 ENCOUNTER — Encounter: Payer: Self-pay | Admitting: Adult Health

## 2021-07-05 ENCOUNTER — Non-Acute Institutional Stay (SKILLED_NURSING_FACILITY): Payer: Medicare Other | Admitting: Adult Health

## 2021-07-05 DIAGNOSIS — Z13220 Encounter for screening for lipoid disorders: Secondary | ICD-10-CM | POA: Diagnosis not present

## 2021-07-05 DIAGNOSIS — Z1329 Encounter for screening for other suspected endocrine disorder: Secondary | ICD-10-CM | POA: Diagnosis not present

## 2021-07-05 DIAGNOSIS — E785 Hyperlipidemia, unspecified: Secondary | ICD-10-CM | POA: Diagnosis not present

## 2021-07-05 DIAGNOSIS — U071 COVID-19: Secondary | ICD-10-CM

## 2021-07-05 DIAGNOSIS — R7309 Other abnormal glucose: Secondary | ICD-10-CM | POA: Diagnosis not present

## 2021-07-05 DIAGNOSIS — Z131 Encounter for screening for diabetes mellitus: Secondary | ICD-10-CM | POA: Diagnosis not present

## 2021-07-05 LAB — LIPID PANEL
Cholesterol: 110 (ref 0–200)
HDL: 43 (ref 35–70)
LDL Cholesterol: 57
LDl/HDL Ratio: 2.6
Triglycerides: 51 (ref 40–160)

## 2021-07-05 LAB — BASIC METABOLIC PANEL
BUN: 26 — AB (ref 4–21)
CO2: 22 (ref 13–22)
Chloride: 105 (ref 99–108)
Creatinine: 0.6 (ref 0.6–1.3)
Glucose: 107
Potassium: 4.1 mEq/L (ref 3.5–5.1)
Sodium: 143 (ref 137–147)

## 2021-07-05 LAB — COMPREHENSIVE METABOLIC PANEL
Calcium: 9.1 (ref 8.7–10.7)
eGFR: >90

## 2021-07-05 LAB — HEMOGLOBIN A1C: Hemoglobin A1C: 5.7

## 2021-07-05 NOTE — Progress Notes (Signed)
?Location:  [facility] ?  ?Place of Service:  SNF (31) ?Provider:   ?Cindi Carbon, ANP ?[place] ?[phone] ? ? ?[name], MD ? ?Patient Care Team: ?[name], MD as PCP - General (Internal Medicine) ?Larey Dresser, MD as PCP - Cardiology (Cardiology) ?[name], MD as Consulting Physician (Ophthalmology) ?[name], MD (Internal Medicine) ? ?Extended Emergency Contact Information ?Primary Emergency Contact: Pinard,Agnes S ?Address: [address] ?         [place], [place] [address] [place] ?Home Phone: [phone] ?Mobile Phone: [phone] ?Relation: Spouse ?Secondary Emergency Contact: Boyett,Joseph ?         [place], [place] [address] [place] ?Home Phone: [phone] ?Relation: Son ? ?Code Status:  DNR ?Goals of care: Advanced Directive information ? ?  06/02/2021  ?  2:08 PM  ?Advanced Directives  ?Does Patient Have a Medical Advance Directive? Yes  ?Type of Paramedic of [place];Living will  ? ? ? ?Chief Complaint  ?Patient presents with  ? Acute Visit  ?  F/u covid  ? ? ?HPI:  ?Pt is a 81 y.o. male seen today for an acute visit for f/u covid.  ?PMH includes syncope with fall in May of 2022, TBI with SDH with craniotomy, acute right MCA s/p thrombectomy, bioprosthetic mitral and aortic valve replacement, DM II, diastiolic CHF, BPH, and afib.  ?He developed a dry cough on 4/28. Outbreak of covid at facility noted. Rapid swab done and positive. Started on molnupiravir for 5 days (4/28) ?As of today he is up, alert, and his wife is feeding him breakfast. Over the weekend he appeared pale and tired but is feeling better now. Minimal cough. Denies any sore throat or body aches. No fever recorded. Oxygen sats WNL. Not sob. He is vaccinated for covid and received a booster last week.  ? ?Past Medical History:  ?Diagnosis Date  ? Acute rheumatic heart disease, unspecified   ? childhood, age   89 & 14  ? Acute rheumatic pericarditis   ? Atrial fibrillation ([place])   ? history  ? CHF (congestive heart failure) ([place])   ? Diverticulosis   ? Dysrhythmia   ? 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 External hemorrhoids without mention of complication   ? H/O aortic valve replacement   ? H/O mitral valve replacement   ? Lesion of ulnar nerve   ? injury / left arm  ? Lesion of ulnar nerve   ? 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 Other and unspecified hyperlipidemia   ? Pre-diabetes   ? Previous back surgery 1978, jan 2007  ? Psychosexual dysfunction with inhibited sexual excitement   ? SOB (shortness of breath)   ? "with heavy exercise"  ? Stroke ([place]) 08/2013  ? "I WAS IN AFIB AND THREW A CLOT, THE EFFECTS WERE TRANSITORY AND DIDNT LAST BUT FOR 30 MINUTES"   ? Thoracic aortic aneurysm ([facility])   ? ?Past Surgical History:  ?Procedure Laterality Date  ? AORTIC AND MITRAL VALVE REPLACEMENT    ? 09/2004  ? CARDIOVERSION    ? 3 times from 2004-2006  ? CARDIOVERSION N/A 09/26/2013  ? Procedure: CARDIOVERSION;  Surgeon: Larey Dresser, MD;  Location: [facility];  Service: Cardiovascular;  Laterality: N/A;  ? CARDIOVERSION N/A 06/19/2014  ? Procedure: CARDIOVERSION;  Surgeon: Jerline Pain, MD;  Location: [facility] ENDOSCOPY;  Service: Cardiovascular;  Laterality: N/A;  ? CARDIOVERSION N/A  04/24/2017  ? Procedure: CARDIOVERSION;  Surgeon: Larey Dresser, MD;  Location: [facility] ENDOSCOPY;  Service: Cardiovascular;  Laterality: N/A;  ? CARDIOVERSION N/A 06/08/2017  ? Procedure: CARDIOVERSION;  Surgeon: Pixie Casino, MD;  Location: [facility];  Service: Cardiovascular;  Laterality: N/A;  ? CARDIOVERSION N/A 08/24/2017  ? Procedure: CARDIOVERSION;  Surgeon: Skeet Latch, MD;  Location: [facility];  Service: Cardiovascular;  Laterality: N/A;  ? CARDIOVERSION N/A 02/14/2018  ? Procedure: CARDIOVERSION;  Surgeon: Larey Dresser, MD;  Location: [facility] ENDOSCOPY;  Service: Cardiovascular;  Laterality: N/A;  ? COLONOSCOPY    ?  CRANIOTOMY N/A 07/28/2020  ? Procedure: CRANIOTOMY FOR EVACUATION OF SUBDURAL HEMATOMA;  Surgeon: Eustace Whitsel, MD;  Location: [facility];  Service: Neurosurgery;  Laterality: N/A;  ? IR ANGIO VERTEBRAL SEL SUBCLAVIAN INNOMINATE UNI R MOD SED  08/07/2020  ? IR CT HEAD LTD  08/07/2020  ? IR PERCUTANEOUS ART THROMBECTOMY/INFUSION INTRACRANIAL INC DIAG ANGIO  08/07/2020  ? laminectomies    ? 10/1975 and in 03/2005  ? RADIOLOGY WITH ANESTHESIA N/A 08/07/2020  ? Procedure: IR WITH ANESTHESIA;  Surgeon: Luanne Bras, MD;  Location: [facility];  Service: Radiology;  Laterality: N/A;  ? TEE WITHOUT CARDIOVERSION N/A 12/18/2020  ? Procedure: TRANSESOPHAGEAL ECHOCARDIOGRAM (TEE);  Surgeon: Larey Dresser, MD;  Location: [facility] ENDOSCOPY;  Service: Cardiovascular;  Laterality: N/A;  ? TONSILLECTOMY    ? 1950  ? TOTAL HIP ARTHROPLASTY Right 04/03/2018  ? Procedure: TOTAL HIP ARTHROPLASTY ANTERIOR APPROACH;  Surgeon: Paralee Cancel, MD;  Location: [facility];  Service: Orthopedics;  Laterality: Right;  70 mins  ? ? ?Allergies  ?Allergen Reactions  ? Naproxen Hives  ? No [employer] Food Allergies Other (See Comments)  ?  Scallops - distress, nausea and vomitting  ? ? ?Outpatient Encounter Medications as of 07/05/2021  ?Medication Sig  ? amantadine (SYMMETREL) 100 MG capsule Take 100 mg by mouth 2 (two) times daily. Between 8-11 am and 12-3 pm  ? amLODipine (NORVASC) 5 MG tablet Take 5 mg by mouth daily.  ? amoxicillin (AMOXIL) 400 MG/5ML suspension Take 500 mg by mouth 2 (two) times daily.  ? apixaban (ELIQUIS) 5 MG TABS tablet Take 1 tablet (5 mg total) by mouth 2 (two) times daily.  ? Armodafinil 50 MG tablet Take 2 tablets (100 mg total) by mouth daily. (Patient not taking: Reported on 06/29/2021)  ? Artificial Saliva (BIOTENE MOISTURIZING MOUTH MT) Use as directed 2 sprays in the mouth or throat in the morning, at noon, and at bedtime.  ? atorvastatin (LIPITOR) 80 MG tablet Take 1 tablet (80 mg total) by mouth daily.  ? bisacodyl (DULCOLAX) 10 MG  suppository Place 1 suppository (10 mg total) rectally daily as needed for moderate constipation or mild constipation.  ? clobetasol cream (TEMOVATE) 9.75 % Apply 1 application topically daily as needed (for skin irritation).   ? dofetilide (TIKOSYN) 125 MCG capsule Take 3 capsules (375 mcg total) by mouth 2 (two) times daily.  ? famotidine (PEPCID) 20 MG tablet Take 20 mg by mouth at bedtime as needed for indigestion.  ? finasteride (PROSCAR) 5 MG tablet Take 1 tablet (5 mg total) by mouth daily.  ? Glucerna (GLUCERNA) LIQD Take 237 mLs by mouth daily.  ? hydrocortisone cream 1 % Apply topically 2 (two) times daily as needed for itching (rash).  ? lacosamide (VIMPAT) 50 MG TABS tablet Take 1 tablet (50 mg total) by mouth 2 (two) times daily.  ? levalbuterol (XOPENEX) 0.63 MG/3ML nebulizer solution  Take 0.63 mg by nebulization every 6 (six) hours as needed for wheezing or shortness of breath.  ? magnesium gluconate (MAGONATE) 500 MG tablet Take 1 tablet (500 mg total) by mouth daily.  ? melatonin 5 MG TABS Take 5 mg by mouth at bedtime.  ? methenamine (HIPREX) 1 g tablet Take 1 g by mouth every 12 (twelve) hours.  ? molnupiravir EUA (LAGEVRIO) 200 mg CAPS capsule Take 4 capsules (800 mg total) by mouth 2 (two) times daily for 5 days.  ? Multiple Vitamin (MULTIVITAMIN WITH MINERALS) TABS tablet Take 1 tablet by mouth daily.  ? Polyethyl Glycol-Propyl Glycol (SYSTANE) 0.4-0.3 % SOLN Place 2 drops into both eyes daily.  ? polyethylene glycol (MIRALAX / GLYCOLAX) 17 g packet Take 17 g by mouth every other day.  ? potassium chloride (KLOR-CON M) 10 MEQ tablet Take 1 tablet (10 mEq total) by mouth daily.  ? senna-docusate (SENOKOT-S) 8.6-50 MG tablet Take 1 tablet by mouth at bedtime as needed.  ? torsemide (DEMADEX) 20 MG tablet Take 20 mg by mouth daily as needed.  ? ?No facility-administered encounter medications on file as of 07/05/2021.  ? ? ?Review of Systems  ?Constitutional:  Positive for fatigue. Negative for  activity change, appetite change, chills, diaphoresis, fever and unexpected weight change.  ?Respiratory:  Positive for cough. Negative for shortness of breath, wheezing and stridor.   ?Cardiovascular:  Negativ

## 2021-07-08 ENCOUNTER — Non-Acute Institutional Stay (SKILLED_NURSING_FACILITY): Payer: Medicare Other | Admitting: Adult Health

## 2021-07-08 ENCOUNTER — Encounter: Payer: Self-pay | Admitting: Adult Health

## 2021-07-08 DIAGNOSIS — I6601 Occlusion and stenosis of right middle cerebral artery: Secondary | ICD-10-CM | POA: Diagnosis not present

## 2021-07-08 DIAGNOSIS — R293 Abnormal posture: Secondary | ICD-10-CM | POA: Diagnosis not present

## 2021-07-08 DIAGNOSIS — R051 Acute cough: Secondary | ICD-10-CM | POA: Diagnosis not present

## 2021-07-08 DIAGNOSIS — G9341 Metabolic encephalopathy: Secondary | ICD-10-CM | POA: Diagnosis not present

## 2021-07-08 DIAGNOSIS — U071 COVID-19: Secondary | ICD-10-CM

## 2021-07-08 DIAGNOSIS — H538 Other visual disturbances: Secondary | ICD-10-CM | POA: Diagnosis not present

## 2021-07-08 DIAGNOSIS — S065X3S Traumatic subdural hemorrhage with loss of consciousness of 1 hour to 5 hours 59 minutes, sequela: Secondary | ICD-10-CM | POA: Diagnosis not present

## 2021-07-08 DIAGNOSIS — I5022 Chronic systolic (congestive) heart failure: Secondary | ICD-10-CM | POA: Diagnosis not present

## 2021-07-08 DIAGNOSIS — I69812 Visuospatial deficit and spatial neglect following other cerebrovascular disease: Secondary | ICD-10-CM | POA: Diagnosis not present

## 2021-07-08 DIAGNOSIS — R278 Other lack of coordination: Secondary | ICD-10-CM | POA: Diagnosis not present

## 2021-07-08 DIAGNOSIS — R41840 Attention and concentration deficit: Secondary | ICD-10-CM | POA: Diagnosis not present

## 2021-07-08 DIAGNOSIS — M6389 Disorders of muscle in diseases classified elsewhere, multiple sites: Secondary | ICD-10-CM | POA: Diagnosis not present

## 2021-07-08 NOTE — Progress Notes (Signed)
?Location:  [facility] ?Nursing Home Room Number: 098-J ?Place of Service:  SNF (31) ?Provider:  Cleophus Molt ? ?[name], MD ? ?Patient Care Team: ?[name], MD as PCP - General (Internal Medicine) ?Larey Dresser, MD as PCP - Cardiology (Cardiology) ?[name], MD as Consulting Physician (Ophthalmology) ?[name], MD (Internal Medicine) ? ?Extended Emergency Contact Information ?Primary Emergency Contact: Schank,Agnes S ?Address: [address] ?         [place], [place] [address] [place] ?Home Phone: [phone] ?Mobile Phone: [phone] ?Relation: Spouse ?Secondary Emergency Contact: Desrocher,Joseph ?         [place],  [address] [place] ?Home Phone: [phone] ?Relation: Son ? ?Code Status:  DNR ?Goals of care: Advanced Directive information ? ?  07/08/2021  ? 10:13 AM  ?Advanced Directives  ?Does Patient Have a Medical Advance Directive? Yes  ?Type of Paramedic of [place];Living will;Out of facility DNR (pink MOST or yellow form)  ?Does patient want to make changes to medical advance directive? No - Patient declined  ?Copy of [place] in Chart? Yes - validated most recent copy scanned in chart (See row information)  ? ? ? ?Chief Complaint  ?Patient presents with  ? Acute Visit  ?  Cough  ? ? ?HPI:  ?Pt is a 81 y.o. male seen today for an acute visit for cough and covid f/u. ?PMH includes syncope with fall in May of 2022, TBI with SDH with craniotomy, acute right MCA s/p thrombectomy, bioprosthetic mitral and aortic valve replacement, DM II, diastiolic CHF, BPH, and afib.  ?He developed a dry cough on 4/28. Outbreak of covid at facility noted. Rapid swab done and positive. Started on molnupiravir for 5 days (4/28) ?As of today he is up, alert, and working with OT.  He had an episode of wheezing on 5/3.  02 sats have been within normal limits. NO cyanosis. No sob. No  sputum production. Dry cough is noted. Nurse questioning which med to use for the cough.  ? ?Past Medical History:  ?Diagnosis Date  ? Acute rheumatic heart disease, unspecified   ? childhood, age  51 & 9  ? Acute rheumatic pericarditis   ? Atrial fibrillation ([place])   ? history  ? CHF (congestive heart failure) ([place])   ? Diverticulosis   ? Dysrhythmia   ? 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 External hemorrhoids without mention of complication   ? H/O aortic valve replacement   ? H/O mitral valve replacement   ? Lesion of ulnar nerve   ? injury / left arm  ? Lesion of ulnar nerve   ? 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 Other and unspecified hyperlipidemia   ? Pre-diabetes   ? Previous back surgery 1978, jan 2007  ? Psychosexual dysfunction with inhibited sexual excitement   ? SOB (shortness of breath)   ? "with heavy exercise"  ? Stroke ([place]) 08/2013  ? "I WAS IN AFIB AND THREW A CLOT, THE EFFECTS WERE TRANSITORY AND DIDNT LAST BUT FOR 30 MINUTES"   ? Thoracic aortic aneurysm ([place])   ? ?Past Surgical History:  ?Procedure Laterality Date  ? AORTIC AND MITRAL VALVE REPLACEMENT    ? 09/2004  ? CARDIOVERSION    ? 3 times from 2004-2006  ? CARDIOVERSION N/A 09/26/2013  ? Procedure: CARDIOVERSION;  Surgeon: Larey Dresser, MD;  Location: [facility];  Service: Cardiovascular;  Laterality: N/A;  ? CARDIOVERSION N/A 06/19/2014  ? Procedure: CARDIOVERSION;  Surgeon: Jerline Pain, MD;  Location: [facility];  Service: Cardiovascular;  Laterality: N/A;  ? CARDIOVERSION N/A 04/24/2017  ? Procedure: CARDIOVERSION;  Surgeon: Larey Dresser, MD;  Location: [facility] ENDOSCOPY;  Service: Cardiovascular;  Laterality: N/A;  ? CARDIOVERSION N/A 06/08/2017  ? Procedure: CARDIOVERSION;  Surgeon: Pixie Casino, MD;  Location: [facility];  Service: Cardiovascular;  Laterality: N/A;  ? CARDIOVERSION N/A 08/24/2017  ? Procedure: CARDIOVERSION;  Surgeon: Skeet Latch, MD;  Location: [facility];  Service: Cardiovascular;   Laterality: N/A;  ? CARDIOVERSION N/A 02/14/2018  ? Procedure: CARDIOVERSION;  Surgeon: Larey Dresser, MD;  Location: [facility] ENDOSCOPY;  Service: Cardiovascular;  Laterality: N/A;  ? COLONOSCOPY    ? CRANIOTOMY N/A 07/28/2020  ? Procedure: CRANIOTOMY FOR EVACUATION OF SUBDURAL HEMATOMA;  Surgeon: Eustace Bethune, MD;  Location: [facility];  Service: Neurosurgery;  Laterality: N/A;  ? IR ANGIO VERTEBRAL SEL SUBCLAVIAN INNOMINATE UNI R MOD SED  08/07/2020  ? IR CT HEAD LTD  08/07/2020  ? IR PERCUTANEOUS ART THROMBECTOMY/INFUSION INTRACRANIAL INC DIAG ANGIO  08/07/2020  ? laminectomies    ? 10/1975 and in 03/2005  ? RADIOLOGY WITH ANESTHESIA N/A 08/07/2020  ? Procedure: IR WITH ANESTHESIA;  Surgeon: Luanne Bras, MD;  Location: [facility];  Service: Radiology;  Laterality: N/A;  ? TEE WITHOUT CARDIOVERSION N/A 12/18/2020  ? Procedure: TRANSESOPHAGEAL ECHOCARDIOGRAM (TEE);  Surgeon: Larey Dresser, MD;  Location: [facility] ENDOSCOPY;  Service: Cardiovascular;  Laterality: N/A;  ? TONSILLECTOMY    ? 1950  ? TOTAL HIP ARTHROPLASTY Right 04/03/2018  ? Procedure: TOTAL HIP ARTHROPLASTY ANTERIOR APPROACH;  Surgeon: Paralee Cancel, MD;  [facility]: [facility];  Service: Orthopedics;  Laterality: Right;  70 mins  ? ? ?Allergies  ?Allergen Reactions  ? Naproxen Hives  ? No [employer] Food Allergies Other (See Comments)  ?  Scallops - distress, nausea and vomitting  ? ? ?Outpatient Encounter Medications as of 07/08/2021  ?Medication Sig  ? amantadine (SYMMETREL) 100 MG capsule Take 100 mg by mouth 2 (two) times daily. Between 8-11 am and 12-3 pm  ? amLODipine (NORVASC) 5 MG tablet Take 5 mg by mouth daily.  ? amoxicillin (AMOXIL) 400 MG/5ML suspension Take 500 mg by mouth 2 (two) times daily.  ? apixaban (ELIQUIS) 5 MG TABS tablet Take 1 tablet (5 mg total) by mouth 2 (two) times daily.  ? Armodafinil 50 MG tablet Take 2 tablets (100 mg total) by mouth daily.  ? Artificial Saliva (BIOTENE MOISTURIZING MOUTH MT) Use as directed 2 sprays in the mouth or  throat in the morning, at noon, and at bedtime.  ? atorvastatin (LIPITOR) 80 MG tablet Take 1 tablet (80 mg total) by mouth daily.  ? bisacodyl (DULCOLAX) 10 MG suppository Place 1 suppository (10 mg total) rectally daily as needed for moderate constipation or mild constipation.  ? clobetasol cream (TEMOVATE) 5.88 % Apply 1 application topically daily as needed (for skin irritation).   ? dofetilide (TIKOSYN) 125 MCG capsule Take 3 capsules (375 mcg total) by mouth 2 (two) times daily.  ? famotidine (PEPCID) 20 MG tablet Take 20 mg by mouth at bedtime as needed for indigestion.  ? finasteride (PROSCAR) 5 MG tablet Take 1 tablet (5 mg total) by mouth daily.  ? Glucerna (GLUCERNA) LIQD Take 237 mLs by mouth daily.  ? hydrocortisone cream 1 % Apply topically 2 (two) times daily as needed for itching (rash).  ? lacosamide (  VIMPAT) 50 MG TABS tablet Take 1 tablet (50 mg total) by mouth 2 (two) times daily.  ? levalbuterol (XOPENEX) 0.63 MG/3ML nebulizer solution Take 0.63 mg by nebulization every 6 (six) hours as needed for wheezing or shortness of breath.  ? magnesium gluconate (MAGONATE) 500 MG tablet Take 1 tablet (500 mg total) by mouth daily.  ? melatonin 5 MG TABS Take 5 mg by mouth at bedtime.  ? methenamine (HIPREX) 1 g tablet Take 1 g by mouth every 12 (twelve) hours.  ? Multiple Vitamin (MULTIVITAMIN WITH MINERALS) TABS tablet Take 1 tablet by mouth daily.  ? Polyethyl Glycol-Propyl Glycol (SYSTANE) 0.4-0.3 % SOLN Place 2 drops into both eyes daily.  ? polyethylene glycol (MIRALAX / GLYCOLAX) 17 g packet Take 17 g by mouth every other day.  ? potassium chloride (KLOR-CON M) 10 MEQ tablet Take 1 tablet (10 mEq total) by mouth daily.  ? senna-docusate (SENOKOT-S) 8.6-50 MG tablet Take 1 tablet by mouth at bedtime as needed.  ? torsemide (DEMADEX) 20 MG tablet Take 20 mg by mouth daily as needed.  ? ?No facility-administered encounter medications on file as of 07/08/2021.  ? ? ?Review of Systems  ?Unable to perform  ROS: Other  ? ?Immunization History  ?Administered Date(s) Administered  ? Fluad Quad(high Dose 65+) 12/06/2018, 12/09/2020  ? Influenza Whole 12/25/2008, 01/05/2010, 12/30/2011  ? Influenza, High Dose Seasonal PF 10/1

## 2021-07-09 DIAGNOSIS — I6601 Occlusion and stenosis of right middle cerebral artery: Secondary | ICD-10-CM | POA: Diagnosis not present

## 2021-07-09 DIAGNOSIS — R41841 Cognitive communication deficit: Secondary | ICD-10-CM | POA: Diagnosis not present

## 2021-07-09 DIAGNOSIS — R41840 Attention and concentration deficit: Secondary | ICD-10-CM | POA: Diagnosis not present

## 2021-07-09 DIAGNOSIS — H538 Other visual disturbances: Secondary | ICD-10-CM | POA: Diagnosis not present

## 2021-07-09 DIAGNOSIS — I5032 Chronic diastolic (congestive) heart failure: Secondary | ICD-10-CM | POA: Diagnosis not present

## 2021-07-09 DIAGNOSIS — I69812 Visuospatial deficit and spatial neglect following other cerebrovascular disease: Secondary | ICD-10-CM | POA: Diagnosis not present

## 2021-07-09 DIAGNOSIS — R293 Abnormal posture: Secondary | ICD-10-CM | POA: Diagnosis not present

## 2021-07-09 DIAGNOSIS — I5022 Chronic systolic (congestive) heart failure: Secondary | ICD-10-CM | POA: Diagnosis not present

## 2021-07-09 DIAGNOSIS — I69354 Hemiplegia and hemiparesis following cerebral infarction affecting left non-dominant side: Secondary | ICD-10-CM | POA: Diagnosis not present

## 2021-07-09 DIAGNOSIS — G9341 Metabolic encephalopathy: Secondary | ICD-10-CM | POA: Diagnosis not present

## 2021-07-09 DIAGNOSIS — S065X3S Traumatic subdural hemorrhage with loss of consciousness of 1 hour to 5 hours 59 minutes, sequela: Secondary | ICD-10-CM | POA: Diagnosis not present

## 2021-07-09 DIAGNOSIS — R278 Other lack of coordination: Secondary | ICD-10-CM | POA: Diagnosis not present

## 2021-07-09 DIAGNOSIS — I69391 Dysphagia following cerebral infarction: Secondary | ICD-10-CM | POA: Diagnosis not present

## 2021-07-09 DIAGNOSIS — M6389 Disorders of muscle in diseases classified elsewhere, multiple sites: Secondary | ICD-10-CM | POA: Diagnosis not present

## 2021-07-12 ENCOUNTER — Ambulatory Visit (INDEPENDENT_AMBULATORY_CARE_PROVIDER_SITE_OTHER): Payer: Medicare Other | Admitting: Internal Medicine

## 2021-07-12 ENCOUNTER — Encounter: Payer: Self-pay | Admitting: Internal Medicine

## 2021-07-12 ENCOUNTER — Other Ambulatory Visit: Payer: Self-pay

## 2021-07-12 VITALS — BP 121/79 | HR 79 | Temp 97.4°F

## 2021-07-12 DIAGNOSIS — G9341 Metabolic encephalopathy: Secondary | ICD-10-CM | POA: Diagnosis not present

## 2021-07-12 DIAGNOSIS — R41840 Attention and concentration deficit: Secondary | ICD-10-CM | POA: Diagnosis not present

## 2021-07-12 DIAGNOSIS — T826XXS Infection and inflammatory reaction due to cardiac valve prosthesis, sequela: Secondary | ICD-10-CM | POA: Diagnosis not present

## 2021-07-12 DIAGNOSIS — I5022 Chronic systolic (congestive) heart failure: Secondary | ICD-10-CM | POA: Diagnosis not present

## 2021-07-12 DIAGNOSIS — I38 Endocarditis, valve unspecified: Secondary | ICD-10-CM

## 2021-07-12 DIAGNOSIS — M6389 Disorders of muscle in diseases classified elsewhere, multiple sites: Secondary | ICD-10-CM | POA: Diagnosis not present

## 2021-07-12 DIAGNOSIS — H538 Other visual disturbances: Secondary | ICD-10-CM | POA: Diagnosis not present

## 2021-07-12 DIAGNOSIS — R278 Other lack of coordination: Secondary | ICD-10-CM | POA: Diagnosis not present

## 2021-07-12 DIAGNOSIS — R293 Abnormal posture: Secondary | ICD-10-CM | POA: Diagnosis not present

## 2021-07-12 DIAGNOSIS — I69812 Visuospatial deficit and spatial neglect following other cerebrovascular disease: Secondary | ICD-10-CM | POA: Diagnosis not present

## 2021-07-12 DIAGNOSIS — I6601 Occlusion and stenosis of right middle cerebral artery: Secondary | ICD-10-CM | POA: Diagnosis not present

## 2021-07-12 DIAGNOSIS — I5032 Chronic diastolic (congestive) heart failure: Secondary | ICD-10-CM | POA: Diagnosis not present

## 2021-07-12 DIAGNOSIS — R41841 Cognitive communication deficit: Secondary | ICD-10-CM | POA: Diagnosis not present

## 2021-07-12 DIAGNOSIS — I69391 Dysphagia following cerebral infarction: Secondary | ICD-10-CM | POA: Diagnosis not present

## 2021-07-12 DIAGNOSIS — I69354 Hemiplegia and hemiparesis following cerebral infarction affecting left non-dominant side: Secondary | ICD-10-CM | POA: Diagnosis not present

## 2021-07-12 DIAGNOSIS — S065X3S Traumatic subdural hemorrhage with loss of consciousness of 1 hour to 5 hours 59 minutes, sequela: Secondary | ICD-10-CM | POA: Diagnosis not present

## 2021-07-12 LAB — BASIC METABOLIC PANEL
BUN: 23 — AB (ref 4–21)
CO2: 24 — AB (ref 13–22)
Chloride: 107 (ref 99–108)
Creatinine: 0.7 (ref 0.6–1.3)
Glucose: 90
Potassium: 4 mEq/L (ref 3.5–5.1)
Sodium: 142 (ref 137–147)

## 2021-07-12 LAB — COMPREHENSIVE METABOLIC PANEL: Calcium: 9.1 (ref 8.7–10.7)

## 2021-07-12 NOTE — Progress Notes (Signed)
? ?   ? ? ? ? ?Patient Active Problem List  ? Diagnosis Date Noted  ? Sleep disorder 06/02/2021  ? Prolonged Q-T interval on ECG 04/30/2021  ? Secondary hypercoagulable state ([place]) 02/01/2021  ? Acute on chronic diastolic CHF (congestive heart failure) ([place])   ? CHF exacerbation ([place]) 01/23/2021  ? Tremor of unknown origin 01/18/2021  ? Prosthetic valve endocarditis ([place])   ? Protein-calorie malnutrition, severe 12/18/2020  ? S/P MVR (mitral valve repair)   ? History of CVA in adulthood   ? History of aortic valve replacement   ? History of mitral valve replacement   ? Pressure injury of skin 12/14/2020  ? Hypernatremia 12/14/2020  ? Somnolence   ? Diabetes mellitus type II, controlled ([place]) 10/15/2020  ? Oropharyngeal dysphagia   ? 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 Acute ischemic right MCA stroke ([place]) 08/15/2020  ? CVA (cerebral vascular accident) ([place]) 08/07/2020  ? Middle cerebral artery embolism, 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  ? Subdural hemorrhage following injury ([place]) 07/31/2020  ? S/P craniotomy 07/28/2020  ? Subdural hematoma ([place]) 07/27/2020  ? Vertigo 08/30/2018  ? S/P right THA, 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  ? Long term (current) use of anticoagulants 01/05/2018  ? History of food allergy 12/11/2017  ? Emesis 12/11/2017  ? Visit for monitoring Tikosyn therapy 06/06/2017  ? Internal hemorrhoid, bleeding 05/19/2016  ? 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  ? 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  ? Aortic valve disorder 07/09/2015  ? Long term current use of anticoagulant therapy 01/19/2015  ? Atrial fibrillation, chronic ([place]) 01/20/2014  ? Elevated PSA 12/23/2013  ? Prediabetes 12/19/2013  ? Benign prostatic hyperplasia with urinary obstruction 10/29/2013  ? left frontal cerebral infarct secondary to afib 08/05/2013  ? Atrial flutter ([place]) 05/17/2013  ? Essential hypertension 06/19/2012  ? Routine health maintenance 04/26/2011  ? Persistent atrial  fibrillation ([place]) 07/10/2008  ? Hyperlipidemia with target LDL less than 130 07/07/2008  ? History of colonic polyps 06/16/2008  ? ERECTILE DYSFUNCTION 09/25/2007  ? ? ?Patient's Medications  ?New Prescriptions  ? No medications on file  ?Previous Medications  ? AMANTADINE (SYMMETREL) 100 MG CAPSULE    Take 100 mg by mouth 2 (two) times daily. Between 8-11 am and 12-3 pm  ? AMLODIPINE (NORVASC) 5 MG TABLET    Take 5 mg by mouth daily.  ? AMOXICILLIN (AMOXIL) 400 MG/5ML SUSPENSION    Take 500 mg by mouth 2 (two) times daily.  ? APIXABAN (ELIQUIS) 5 MG TABS TABLET    Take 1 tablet (5 mg total) by mouth 2 (two) times daily.  ? ARMODAFINIL 50 MG TABLET    Take 2 tablets (100 mg total) by mouth daily.  ? ARTIFICIAL SALIVA (BIOTENE MOISTURIZING MOUTH MT)    Use as directed 2 sprays in the mouth or throat in the morning, at noon, and at bedtime.  ? ATORVASTATIN (LIPITOR) 80 MG TABLET    Take 1 tablet (80 mg total) by mouth daily.  ? BISACODYL (DULCOLAX) 10 MG SUPPOSITORY    Place 1 suppository (10 mg total) rectally daily as needed for moderate constipation or mild constipation.  ? CLOBETASOL CREAM (TEMOVATE) 0.05 %    Apply 1 application topically daily as needed (for skin irritation).   ? DOFETILIDE (TIKOSYN) 125 MCG CAPSULE    Take 3 capsules (375 mcg total) by mouth 2 (two) times daily.  ? FAMOTIDINE (PEPCID) 20 MG TABLET  Take 20 mg by mouth at bedtime as needed for indigestion.  ? FINASTERIDE (PROSCAR) 5 MG TABLET    Take 1 tablet (5 mg total) by mouth daily.  ? GLUCERNA (GLUCERNA) LIQD    Take 237 mLs by mouth daily.  ? HYDROCORTISONE CREAM 1 %    Apply topically 2 (two) times daily as needed for itching (rash).  ? LACOSAMIDE (VIMPAT) 50 MG TABS TABLET    Take 1 tablet (50 mg total) by mouth 2 (two) times daily.  ? LEVALBUTEROL (XOPENEX) 0.63 MG/3ML NEBULIZER SOLUTION    Take 0.63 mg by nebulization every 6 (six) hours as needed for wheezing or shortness of breath.  ? MAGNESIUM GLUCONATE (MAGONATE) 500 MG TABLET     Take 1 tablet (500 mg total) by mouth daily.  ? MELATONIN 5 MG TABS    Take 5 mg by mouth at bedtime.  ? METHENAMINE (HIPREX) 1 G TABLET    Take 1 g by mouth every 12 (twelve) hours.  ? MULTIPLE VITAMIN (MULTIVITAMIN WITH MINERALS) TABS TABLET    Take 1 tablet by mouth daily.  ? POLYETHYL GLYCOL-PROPYL GLYCOL (SYSTANE) 0.4-0.3 % SOLN    Place 2 drops into both eyes daily.  ? POLYETHYLENE GLYCOL (MIRALAX / GLYCOLAX) 17 G PACKET    Take 17 g by mouth every other day.  ? POTASSIUM CHLORIDE (KLOR-CON M) 10 MEQ TABLET    Take 1 tablet (10 mEq total) by mouth daily.  ? SENNA-DOCUSATE (SENOKOT-S) 8.6-50 MG TABLET    Take 1 tablet by mouth at bedtime as needed.  ? TORSEMIDE (DEMADEX) 20 MG TABLET    Take 20 mg by mouth daily as needed.  ?Modified Medications  ? No medications on file  ?Discontinued Medications  ? No medications on file  ? ? ?Subjective: ?[name] [name] is a 81 y.o. 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 ?04/19/19 he presents with his wife. Pt lives at [facility]. Wife reports that he is able to tolerate the amoxicillin. She believes if pt's PO intake does not improve, peg tube may be the next step. ?Interim: Seen by [facility] on 1/25, noted to have vegetation still present on mitral valve. Blood Cx NG on 1/25. No plan to change management. ?05/11/21: Wife states pt is still not interacting very much. Other wise no other complaints.   ?Interim: Seen by Dr. [name] on 4/25. Plan to follow-up in 6 months ?Today 07/12/21: Wife present at visit. She reports pt is more sleepy than usual. He has a sleep study scheduled.  ?Review of Systems: ?Review of Systems  ?All other systems reviewed and are  negative. ? ?Past Medical History:  ?Diagnosis Date  ? Acute rheumatic heart disease, unspecified   ? childhood, age  15 & 27  ? Acute rheumatic pericarditis   ? Atrial fibrillation ([place])   ? history  ? CHF (congestive heart failure) ([place])   ? Diverticulosis   ? Dysrhythmia   ? 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 External hemorrhoids without mention of complication   ? H/O aortic valve replacement   ? H/O mitral valve replacement   ? Lesion of ulnar nerve   ? injury / left arm  ? Lesion of ulnar nerve   ?  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 Other and unspecified hyperlipidemia   ? Pre-diabetes   ? Previous back surgery 1978, jan 2007  ? Psychosexual dysfunction with inhibited sexual excitement   ? SOB (shortness of breath)   ? "with heavy exercise"  ? Stroke ([place]) 08/2013  ? "I WAS IN AFIB AND THREW A CLOT, THE EFFECTS WERE TRANSITORY AND DIDNT LAST BUT FOR 30 MINUTES"   ? Thoracic aortic aneurysm ([place])   ? ? ?Social History  ? ?Tobacco Use  ? Smoking status: Never  ? Smokeless tobacco: Never  ?Vaping Use  ? Vaping Use: Never used  ?Substance Use Topics  ? Alcohol use: Never  ?  Comment: quit 5/22  ? Drug use: Never  ? ? ?Family History  ?Problem Relation Age of Onset  ? Macular degeneration Mother   ?     macular degeneration  ? Cancer Father   ?     intestinal/GI  ? Diabetes Paternal Aunt   ? Heart attack Maternal Grandfather   ? Diabetes Brother   ? ? ?Allergies  ?Allergen Reactions  ? Naproxen Hives  ? No [facility] Food Allergies Other (See Comments)  ?  Scallops - distress, nausea and vomitting  ? ? ?Health Maintenance  ?Topic Date Due  ? FOOT EXAM  Never done  ? OPHTHALMOLOGY EXAM  Never done  ? URINE MICROALBUMIN  Never done  ? COVID-19 Vaccine (3 - Moderna risk series) 07/23/2021 (Originally 01/12/2021)  ? INFLUENZA VACCINE  10/05/2021  ? HEMOGLOBIN A1C  01/05/2022  ? TETANUS/TDAP  03/22/2028  ? Pneumonia Vaccine 31+ Years old  Completed  ? Zoster Vaccines- Shingrix  Completed  ? HPV VACCINES  Aged  Out  ? COLONOSCOPY (Pts 45-28yr Insurance coverage will need to be confirmed)  Discontinued  ? Hepatitis C Screening  Discontinued  ? ? ?Objective: ? ?There were no vitals filed for this visit. ?There is no height or wei

## 2021-07-13 DIAGNOSIS — R41841 Cognitive communication deficit: Secondary | ICD-10-CM | POA: Diagnosis not present

## 2021-07-13 DIAGNOSIS — I69812 Visuospatial deficit and spatial neglect following other cerebrovascular disease: Secondary | ICD-10-CM | POA: Diagnosis not present

## 2021-07-13 DIAGNOSIS — I5032 Chronic diastolic (congestive) heart failure: Secondary | ICD-10-CM | POA: Diagnosis not present

## 2021-07-13 DIAGNOSIS — I6601 Occlusion and stenosis of right middle cerebral artery: Secondary | ICD-10-CM | POA: Diagnosis not present

## 2021-07-13 DIAGNOSIS — I69391 Dysphagia following cerebral infarction: Secondary | ICD-10-CM | POA: Diagnosis not present

## 2021-07-13 DIAGNOSIS — H538 Other visual disturbances: Secondary | ICD-10-CM | POA: Diagnosis not present

## 2021-07-13 DIAGNOSIS — S065X3S Traumatic subdural hemorrhage with loss of consciousness of 1 hour to 5 hours 59 minutes, sequela: Secondary | ICD-10-CM | POA: Diagnosis not present

## 2021-07-13 DIAGNOSIS — I5022 Chronic systolic (congestive) heart failure: Secondary | ICD-10-CM | POA: Diagnosis not present

## 2021-07-13 DIAGNOSIS — R278 Other lack of coordination: Secondary | ICD-10-CM | POA: Diagnosis not present

## 2021-07-13 DIAGNOSIS — G9341 Metabolic encephalopathy: Secondary | ICD-10-CM | POA: Diagnosis not present

## 2021-07-13 DIAGNOSIS — I69354 Hemiplegia and hemiparesis following cerebral infarction affecting left non-dominant side: Secondary | ICD-10-CM | POA: Diagnosis not present

## 2021-07-13 DIAGNOSIS — M6389 Disorders of muscle in diseases classified elsewhere, multiple sites: Secondary | ICD-10-CM | POA: Diagnosis not present

## 2021-07-13 DIAGNOSIS — R41840 Attention and concentration deficit: Secondary | ICD-10-CM | POA: Diagnosis not present

## 2021-07-13 DIAGNOSIS — R293 Abnormal posture: Secondary | ICD-10-CM | POA: Diagnosis not present

## 2021-07-14 ENCOUNTER — Encounter: Payer: Medicare Other | Attending: Physical Medicine & Rehabilitation | Admitting: Physical Medicine & Rehabilitation

## 2021-07-14 ENCOUNTER — Ambulatory Visit: Payer: Medicare Other | Admitting: Physical Medicine & Rehabilitation

## 2021-07-14 ENCOUNTER — Encounter: Payer: Self-pay | Admitting: *Deleted

## 2021-07-14 ENCOUNTER — Telehealth: Payer: Self-pay | Admitting: Neurology

## 2021-07-14 ENCOUNTER — Encounter: Payer: Self-pay | Admitting: Physical Medicine & Rehabilitation

## 2021-07-14 DIAGNOSIS — I69391 Dysphagia following cerebral infarction: Secondary | ICD-10-CM | POA: Diagnosis not present

## 2021-07-14 DIAGNOSIS — I69812 Visuospatial deficit and spatial neglect following other cerebrovascular disease: Secondary | ICD-10-CM | POA: Diagnosis not present

## 2021-07-14 DIAGNOSIS — S065X3S Traumatic subdural hemorrhage with loss of consciousness of 1 hour to 5 hours 59 minutes, sequela: Secondary | ICD-10-CM | POA: Diagnosis not present

## 2021-07-14 DIAGNOSIS — I69354 Hemiplegia and hemiparesis following cerebral infarction affecting left non-dominant side: Secondary | ICD-10-CM | POA: Diagnosis not present

## 2021-07-14 DIAGNOSIS — I6601 Occlusion and stenosis of right middle cerebral artery: Secondary | ICD-10-CM | POA: Diagnosis not present

## 2021-07-14 DIAGNOSIS — M6389 Disorders of muscle in diseases classified elsewhere, multiple sites: Secondary | ICD-10-CM | POA: Diagnosis not present

## 2021-07-14 DIAGNOSIS — G8114 Spastic hemiplegia affecting left nondominant side: Secondary | ICD-10-CM | POA: Insufficient documentation

## 2021-07-14 DIAGNOSIS — R278 Other lack of coordination: Secondary | ICD-10-CM | POA: Diagnosis not present

## 2021-07-14 DIAGNOSIS — I5022 Chronic systolic (congestive) heart failure: Secondary | ICD-10-CM | POA: Diagnosis not present

## 2021-07-14 DIAGNOSIS — R41840 Attention and concentration deficit: Secondary | ICD-10-CM | POA: Diagnosis not present

## 2021-07-14 DIAGNOSIS — R293 Abnormal posture: Secondary | ICD-10-CM | POA: Diagnosis not present

## 2021-07-14 DIAGNOSIS — I5032 Chronic diastolic (congestive) heart failure: Secondary | ICD-10-CM | POA: Diagnosis not present

## 2021-07-14 DIAGNOSIS — H538 Other visual disturbances: Secondary | ICD-10-CM | POA: Diagnosis not present

## 2021-07-14 DIAGNOSIS — G9341 Metabolic encephalopathy: Secondary | ICD-10-CM | POA: Diagnosis not present

## 2021-07-14 DIAGNOSIS — R41841 Cognitive communication deficit: Secondary | ICD-10-CM | POA: Diagnosis not present

## 2021-07-14 DIAGNOSIS — I63412 Cerebral infarction due to embolism of left middle cerebral artery: Secondary | ICD-10-CM

## 2021-07-14 NOTE — Patient Instructions (Signed)
PLEASE FEEL FREE TO CALL OUR OFFICE WITH ANY PROBLEMS OR QUESTIONS ([phone])      

## 2021-07-14 NOTE — Telephone Encounter (Signed)
[name] [name] called back stating that they did in fact received the fax and is not needing a call back. ?

## 2021-07-14 NOTE — Telephone Encounter (Signed)
Called and LVM for wife letter her know letter faxed as requested and received fax confirmation. ?

## 2021-07-14 NOTE — Telephone Encounter (Signed)
Letter printed, waiting on MD signature ?

## 2021-07-14 NOTE — Telephone Encounter (Signed)
Pt's wife has called to report that [facility] ([place] unit) needs orders so pt can use his CPAP.Orders are needed from Dr Brett Fairy. The fax # is [phone] the phone #'s if there are questions are [phone] or [phone] ?Pt's wife is asking for notification as to when this has been done. ?

## 2021-07-14 NOTE — Telephone Encounter (Signed)
[facility] said they have not received the fax. Would like a call from the nurse. ?

## 2021-07-14 NOTE — Progress Notes (Signed)
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 ?Dilution: 100 Units/ml        Total Units Injected: 300 ?Indication: Severe spasticity which interferes with ADL,mobility and/or  hygiene and is unresponsive to medication management and other conservative care ?Informed consent was obtained after describing risks and benefits of the procedure with the patient. This includes bleeding, bruising, infection, excessive weakness, or medication side effects. A REMS form is on file and signed. ? ?Left  ?Needle: 70m injectable monopolar needle electrode ? ?Number of units per muscle ?Pectoralis Major 50 units ?Pectoralis Minor 50 units ?Biceps 100 units ?Brachioradialis 0 units ?FCR 0 units ?FCU 0 units ?FDS 50 units ?FDP 50 units ?FPL 0 units ?Pronator Teres 0 units ?Pronator Quadratus 0 units ?Lumbricals 0 units ?Quadriceps 0 units ?Gastroc/soleus 0 units ?Hamstrings 0 units ?Tibialis Posterior 0 units ?Tibialis Anterior 0 units ?EHL 0 units ?All injections were done after obtaining appropriate EMG activity and after negative drawback for blood. The patient tolerated the procedure well. Post procedure instructions were given. Return in about 2 months (around 09/13/2021) for follow up after botox injections. ?   ?

## 2021-07-15 DIAGNOSIS — I5032 Chronic diastolic (congestive) heart failure: Secondary | ICD-10-CM | POA: Diagnosis not present

## 2021-07-15 DIAGNOSIS — R41841 Cognitive communication deficit: Secondary | ICD-10-CM | POA: Diagnosis not present

## 2021-07-15 DIAGNOSIS — H538 Other visual disturbances: Secondary | ICD-10-CM | POA: Diagnosis not present

## 2021-07-15 DIAGNOSIS — S065X3S Traumatic subdural hemorrhage with loss of consciousness of 1 hour to 5 hours 59 minutes, sequela: Secondary | ICD-10-CM | POA: Diagnosis not present

## 2021-07-15 DIAGNOSIS — I6601 Occlusion and stenosis of right middle cerebral artery: Secondary | ICD-10-CM | POA: Diagnosis not present

## 2021-07-15 DIAGNOSIS — R278 Other lack of coordination: Secondary | ICD-10-CM | POA: Diagnosis not present

## 2021-07-15 DIAGNOSIS — I69812 Visuospatial deficit and spatial neglect following other cerebrovascular disease: Secondary | ICD-10-CM | POA: Diagnosis not present

## 2021-07-15 DIAGNOSIS — I69391 Dysphagia following cerebral infarction: Secondary | ICD-10-CM | POA: Diagnosis not present

## 2021-07-15 DIAGNOSIS — I5022 Chronic systolic (congestive) heart failure: Secondary | ICD-10-CM | POA: Diagnosis not present

## 2021-07-15 DIAGNOSIS — M6389 Disorders of muscle in diseases classified elsewhere, multiple sites: Secondary | ICD-10-CM | POA: Diagnosis not present

## 2021-07-15 DIAGNOSIS — G9341 Metabolic encephalopathy: Secondary | ICD-10-CM | POA: Diagnosis not present

## 2021-07-15 DIAGNOSIS — R41840 Attention and concentration deficit: Secondary | ICD-10-CM | POA: Diagnosis not present

## 2021-07-15 DIAGNOSIS — I69354 Hemiplegia and hemiparesis following cerebral infarction affecting left non-dominant side: Secondary | ICD-10-CM | POA: Diagnosis not present

## 2021-07-15 DIAGNOSIS — R293 Abnormal posture: Secondary | ICD-10-CM | POA: Diagnosis not present

## 2021-07-16 DIAGNOSIS — R293 Abnormal posture: Secondary | ICD-10-CM | POA: Diagnosis not present

## 2021-07-16 DIAGNOSIS — G9341 Metabolic encephalopathy: Secondary | ICD-10-CM | POA: Diagnosis not present

## 2021-07-16 DIAGNOSIS — I5022 Chronic systolic (congestive) heart failure: Secondary | ICD-10-CM | POA: Diagnosis not present

## 2021-07-16 DIAGNOSIS — I5032 Chronic diastolic (congestive) heart failure: Secondary | ICD-10-CM | POA: Diagnosis not present

## 2021-07-16 DIAGNOSIS — I69391 Dysphagia following cerebral infarction: Secondary | ICD-10-CM | POA: Diagnosis not present

## 2021-07-16 DIAGNOSIS — I69354 Hemiplegia and hemiparesis following cerebral infarction affecting left non-dominant side: Secondary | ICD-10-CM | POA: Diagnosis not present

## 2021-07-16 DIAGNOSIS — H538 Other visual disturbances: Secondary | ICD-10-CM | POA: Diagnosis not present

## 2021-07-16 DIAGNOSIS — M6389 Disorders of muscle in diseases classified elsewhere, multiple sites: Secondary | ICD-10-CM | POA: Diagnosis not present

## 2021-07-16 DIAGNOSIS — I6601 Occlusion and stenosis of right middle cerebral artery: Secondary | ICD-10-CM | POA: Diagnosis not present

## 2021-07-16 DIAGNOSIS — S065X3S Traumatic subdural hemorrhage with loss of consciousness of 1 hour to 5 hours 59 minutes, sequela: Secondary | ICD-10-CM | POA: Diagnosis not present

## 2021-07-16 DIAGNOSIS — R278 Other lack of coordination: Secondary | ICD-10-CM | POA: Diagnosis not present

## 2021-07-16 DIAGNOSIS — R41841 Cognitive communication deficit: Secondary | ICD-10-CM | POA: Diagnosis not present

## 2021-07-16 DIAGNOSIS — I69812 Visuospatial deficit and spatial neglect following other cerebrovascular disease: Secondary | ICD-10-CM | POA: Diagnosis not present

## 2021-07-16 DIAGNOSIS — R41840 Attention and concentration deficit: Secondary | ICD-10-CM | POA: Diagnosis not present

## 2021-07-18 LAB — COMPLETE METABOLIC PANEL WITH GFR
AG Ratio: 1.2 (calc) (ref 1.0–2.5)
ALT: 22 U/L (ref 9–46)
AST: 22 U/L (ref 10–35)
Albumin: 3.9 g/dL (ref 3.6–5.1)
Alkaline phosphatase (APISO): 114 U/L (ref 35–144)
BUN: 24 mg/dL (ref 7–25)
CO2: 27 mmol/L (ref 20–32)
Calcium: 9.3 mg/dL (ref 8.6–10.3)
Chloride: 107 mmol/L (ref 98–110)
Creat: 0.8 mg/dL (ref 0.70–1.28)
Globulin: 3.3 g/dL (calc) (ref 1.9–3.7)
Glucose, Bld: 145 mg/dL — ABNORMAL HIGH (ref 65–99)
Potassium: 3.8 mmol/L (ref 3.5–5.3)
Sodium: 142 mmol/L (ref 135–146)
Total Bilirubin: 0.7 mg/dL (ref 0.2–1.2)
Total Protein: 7.2 g/dL (ref 6.1–8.1)
eGFR: 91 mL/min/{1.73_m2} (ref >=60)

## 2021-07-18 LAB — CULTURE, BLOOD (SINGLE)
MICRO NUMBER:: 13365777
MICRO NUMBER:: 13365778
Result:: NO GROWTH
SPECIMEN QUALITY:: ADEQUATE

## 2021-07-18 LAB — CBC WITH DIFFERENTIAL/PLATELET
Absolute Monocytes: 648 cells/uL (ref 200–950)
Basophils Absolute: 63 cells/uL (ref 0–200)
Basophils Relative: 0.7 %
Eosinophils Absolute: 270 cells/uL (ref 15–500)
Eosinophils Relative: 3 %
HCT: 33.9 % — ABNORMAL LOW (ref 38.5–50.0)
Hemoglobin: 10.9 g/dL — ABNORMAL LOW (ref 13.2–17.1)
Lymphs Abs: 1215 cells/uL (ref 850–3900)
MCH: 29.1 pg (ref 27.0–33.0)
MCHC: 32.2 g/dL (ref 32.0–36.0)
MCV: 90.4 fL (ref 80.0–100.0)
MPV: 11 fL (ref 7.5–12.5)
Monocytes Relative: 7.2 %
Neutro Abs: 6804 cells/uL (ref 1500–7800)
Neutrophils Relative %: 75.6 %
Platelets: 426 10*3/uL — ABNORMAL HIGH (ref 140–400)
RBC: 3.75 10*6/uL — ABNORMAL LOW (ref 4.20–5.80)
RDW: 13.1 % (ref 11.0–15.0)
Total Lymphocyte: 13.5 %
WBC: 9 10*3/uL (ref 3.8–10.8)

## 2021-07-18 LAB — SEDIMENTATION RATE: Sed Rate: 28 mm/h — ABNORMAL HIGH (ref 0–20)

## 2021-07-18 LAB — C-REACTIVE PROTEIN: CRP: 11 mg/L — ABNORMAL HIGH (ref <8.0)

## 2021-07-19 DIAGNOSIS — H538 Other visual disturbances: Secondary | ICD-10-CM | POA: Diagnosis not present

## 2021-07-19 DIAGNOSIS — E870 Hyperosmolality and hypernatremia: Secondary | ICD-10-CM | POA: Diagnosis not present

## 2021-07-19 DIAGNOSIS — I6601 Occlusion and stenosis of right middle cerebral artery: Secondary | ICD-10-CM | POA: Diagnosis not present

## 2021-07-19 DIAGNOSIS — R293 Abnormal posture: Secondary | ICD-10-CM | POA: Diagnosis not present

## 2021-07-19 DIAGNOSIS — I69354 Hemiplegia and hemiparesis following cerebral infarction affecting left non-dominant side: Secondary | ICD-10-CM | POA: Diagnosis not present

## 2021-07-19 DIAGNOSIS — I5032 Chronic diastolic (congestive) heart failure: Secondary | ICD-10-CM | POA: Diagnosis not present

## 2021-07-19 DIAGNOSIS — S065X3S Traumatic subdural hemorrhage with loss of consciousness of 1 hour to 5 hours 59 minutes, sequela: Secondary | ICD-10-CM | POA: Diagnosis not present

## 2021-07-19 DIAGNOSIS — R41841 Cognitive communication deficit: Secondary | ICD-10-CM | POA: Diagnosis not present

## 2021-07-19 DIAGNOSIS — R41840 Attention and concentration deficit: Secondary | ICD-10-CM | POA: Diagnosis not present

## 2021-07-19 DIAGNOSIS — I5022 Chronic systolic (congestive) heart failure: Secondary | ICD-10-CM | POA: Diagnosis not present

## 2021-07-19 DIAGNOSIS — I69812 Visuospatial deficit and spatial neglect following other cerebrovascular disease: Secondary | ICD-10-CM | POA: Diagnosis not present

## 2021-07-19 DIAGNOSIS — M6389 Disorders of muscle in diseases classified elsewhere, multiple sites: Secondary | ICD-10-CM | POA: Diagnosis not present

## 2021-07-19 DIAGNOSIS — G9341 Metabolic encephalopathy: Secondary | ICD-10-CM | POA: Diagnosis not present

## 2021-07-19 DIAGNOSIS — I69391 Dysphagia following cerebral infarction: Secondary | ICD-10-CM | POA: Diagnosis not present

## 2021-07-19 DIAGNOSIS — R278 Other lack of coordination: Secondary | ICD-10-CM | POA: Diagnosis not present

## 2021-07-19 LAB — BASIC METABOLIC PANEL
BUN: 19 (ref 4–21)
CO2: 23 — AB (ref 13–22)
Chloride: 108 (ref 99–108)
Creatinine: 0.7 (ref 0.6–1.3)
Glucose: 96
Potassium: 4.1 mEq/L (ref 3.5–5.1)
Sodium: 143 (ref 137–147)

## 2021-07-19 LAB — COMPREHENSIVE METABOLIC PANEL: Calcium: 9.4 (ref 8.7–10.7)

## 2021-07-20 ENCOUNTER — Telehealth: Payer: Self-pay | Admitting: Neurology

## 2021-07-20 DIAGNOSIS — I69354 Hemiplegia and hemiparesis following cerebral infarction affecting left non-dominant side: Secondary | ICD-10-CM | POA: Diagnosis not present

## 2021-07-20 DIAGNOSIS — M6389 Disorders of muscle in diseases classified elsewhere, multiple sites: Secondary | ICD-10-CM | POA: Diagnosis not present

## 2021-07-20 DIAGNOSIS — G9341 Metabolic encephalopathy: Secondary | ICD-10-CM | POA: Diagnosis not present

## 2021-07-20 DIAGNOSIS — I69391 Dysphagia following cerebral infarction: Secondary | ICD-10-CM | POA: Diagnosis not present

## 2021-07-20 DIAGNOSIS — I6601 Occlusion and stenosis of right middle cerebral artery: Secondary | ICD-10-CM | POA: Diagnosis not present

## 2021-07-20 DIAGNOSIS — R278 Other lack of coordination: Secondary | ICD-10-CM | POA: Diagnosis not present

## 2021-07-20 DIAGNOSIS — I5022 Chronic systolic (congestive) heart failure: Secondary | ICD-10-CM | POA: Diagnosis not present

## 2021-07-20 DIAGNOSIS — R293 Abnormal posture: Secondary | ICD-10-CM | POA: Diagnosis not present

## 2021-07-20 DIAGNOSIS — S065X3S Traumatic subdural hemorrhage with loss of consciousness of 1 hour to 5 hours 59 minutes, sequela: Secondary | ICD-10-CM | POA: Diagnosis not present

## 2021-07-20 DIAGNOSIS — I5032 Chronic diastolic (congestive) heart failure: Secondary | ICD-10-CM | POA: Diagnosis not present

## 2021-07-20 DIAGNOSIS — I69812 Visuospatial deficit and spatial neglect following other cerebrovascular disease: Secondary | ICD-10-CM | POA: Diagnosis not present

## 2021-07-20 DIAGNOSIS — R41841 Cognitive communication deficit: Secondary | ICD-10-CM | POA: Diagnosis not present

## 2021-07-20 DIAGNOSIS — R41840 Attention and concentration deficit: Secondary | ICD-10-CM | POA: Diagnosis not present

## 2021-07-20 DIAGNOSIS — H538 Other visual disturbances: Secondary | ICD-10-CM | POA: Diagnosis not present

## 2021-07-20 NOTE — Telephone Encounter (Signed)
DME and between dates are in pt's SnapShot. ?Pt's appointment was scheduled on 07-01-21 ?

## 2021-07-21 DIAGNOSIS — R278 Other lack of coordination: Secondary | ICD-10-CM | POA: Diagnosis not present

## 2021-07-21 DIAGNOSIS — M6389 Disorders of muscle in diseases classified elsewhere, multiple sites: Secondary | ICD-10-CM | POA: Diagnosis not present

## 2021-07-21 DIAGNOSIS — I5022 Chronic systolic (congestive) heart failure: Secondary | ICD-10-CM | POA: Diagnosis not present

## 2021-07-21 DIAGNOSIS — I6601 Occlusion and stenosis of right middle cerebral artery: Secondary | ICD-10-CM | POA: Diagnosis not present

## 2021-07-21 DIAGNOSIS — I69354 Hemiplegia and hemiparesis following cerebral infarction affecting left non-dominant side: Secondary | ICD-10-CM | POA: Diagnosis not present

## 2021-07-21 DIAGNOSIS — I69812 Visuospatial deficit and spatial neglect following other cerebrovascular disease: Secondary | ICD-10-CM | POA: Diagnosis not present

## 2021-07-21 DIAGNOSIS — R41841 Cognitive communication deficit: Secondary | ICD-10-CM | POA: Diagnosis not present

## 2021-07-21 DIAGNOSIS — I69391 Dysphagia following cerebral infarction: Secondary | ICD-10-CM | POA: Diagnosis not present

## 2021-07-21 DIAGNOSIS — G9341 Metabolic encephalopathy: Secondary | ICD-10-CM | POA: Diagnosis not present

## 2021-07-21 DIAGNOSIS — R41840 Attention and concentration deficit: Secondary | ICD-10-CM | POA: Diagnosis not present

## 2021-07-21 DIAGNOSIS — I5032 Chronic diastolic (congestive) heart failure: Secondary | ICD-10-CM | POA: Diagnosis not present

## 2021-07-21 DIAGNOSIS — R293 Abnormal posture: Secondary | ICD-10-CM | POA: Diagnosis not present

## 2021-07-21 DIAGNOSIS — S065X3S Traumatic subdural hemorrhage with loss of consciousness of 1 hour to 5 hours 59 minutes, sequela: Secondary | ICD-10-CM | POA: Diagnosis not present

## 2021-07-21 DIAGNOSIS — H538 Other visual disturbances: Secondary | ICD-10-CM | POA: Diagnosis not present

## 2021-07-22 DIAGNOSIS — I5022 Chronic systolic (congestive) heart failure: Secondary | ICD-10-CM | POA: Diagnosis not present

## 2021-07-22 DIAGNOSIS — S065X3S Traumatic subdural hemorrhage with loss of consciousness of 1 hour to 5 hours 59 minutes, sequela: Secondary | ICD-10-CM | POA: Diagnosis not present

## 2021-07-22 DIAGNOSIS — R293 Abnormal posture: Secondary | ICD-10-CM | POA: Diagnosis not present

## 2021-07-22 DIAGNOSIS — I69354 Hemiplegia and hemiparesis following cerebral infarction affecting left non-dominant side: Secondary | ICD-10-CM | POA: Diagnosis not present

## 2021-07-22 DIAGNOSIS — I6601 Occlusion and stenosis of right middle cerebral artery: Secondary | ICD-10-CM | POA: Diagnosis not present

## 2021-07-22 DIAGNOSIS — I69391 Dysphagia following cerebral infarction: Secondary | ICD-10-CM | POA: Diagnosis not present

## 2021-07-22 DIAGNOSIS — M6389 Disorders of muscle in diseases classified elsewhere, multiple sites: Secondary | ICD-10-CM | POA: Diagnosis not present

## 2021-07-22 DIAGNOSIS — I69812 Visuospatial deficit and spatial neglect following other cerebrovascular disease: Secondary | ICD-10-CM | POA: Diagnosis not present

## 2021-07-22 DIAGNOSIS — R41841 Cognitive communication deficit: Secondary | ICD-10-CM | POA: Diagnosis not present

## 2021-07-22 DIAGNOSIS — I5032 Chronic diastolic (congestive) heart failure: Secondary | ICD-10-CM | POA: Diagnosis not present

## 2021-07-22 DIAGNOSIS — G9341 Metabolic encephalopathy: Secondary | ICD-10-CM | POA: Diagnosis not present

## 2021-07-22 DIAGNOSIS — R278 Other lack of coordination: Secondary | ICD-10-CM | POA: Diagnosis not present

## 2021-07-22 DIAGNOSIS — H538 Other visual disturbances: Secondary | ICD-10-CM | POA: Diagnosis not present

## 2021-07-22 DIAGNOSIS — R41840 Attention and concentration deficit: Secondary | ICD-10-CM | POA: Diagnosis not present

## 2021-07-22 NOTE — Telephone Encounter (Signed)
Called the patient and she was asking because of the dry mouth he continues to have due to opening his mouth. Advised the wife that in looking at the 9 days worth of data that is available the patient is showing severe leaks and high AHI remaining. Informed her this could be also a cause behind the dry mouth due to the mask not sealing properly. Advised the wife to contact the DME company to get a mask fitting completed with full face covering nose and mouth. Advised they need to make sure he gets a good seal with the machine as well. Pt verbalized understanding. She will contact them about changing his mask. She was appreciative for the call back.

## 2021-07-22 NOTE — Telephone Encounter (Signed)
Pt's wife,Agnes Ancheta want to know if need to change pt's mask. Would like a call from the nurse.

## 2021-07-23 DIAGNOSIS — R41840 Attention and concentration deficit: Secondary | ICD-10-CM | POA: Diagnosis not present

## 2021-07-23 DIAGNOSIS — M6389 Disorders of muscle in diseases classified elsewhere, multiple sites: Secondary | ICD-10-CM | POA: Diagnosis not present

## 2021-07-23 DIAGNOSIS — I5032 Chronic diastolic (congestive) heart failure: Secondary | ICD-10-CM | POA: Diagnosis not present

## 2021-07-23 DIAGNOSIS — I69812 Visuospatial deficit and spatial neglect following other cerebrovascular disease: Secondary | ICD-10-CM | POA: Diagnosis not present

## 2021-07-23 DIAGNOSIS — S065X3S Traumatic subdural hemorrhage with loss of consciousness of 1 hour to 5 hours 59 minutes, sequela: Secondary | ICD-10-CM | POA: Diagnosis not present

## 2021-07-23 DIAGNOSIS — I69354 Hemiplegia and hemiparesis following cerebral infarction affecting left non-dominant side: Secondary | ICD-10-CM | POA: Diagnosis not present

## 2021-07-23 DIAGNOSIS — G9341 Metabolic encephalopathy: Secondary | ICD-10-CM | POA: Diagnosis not present

## 2021-07-23 DIAGNOSIS — H538 Other visual disturbances: Secondary | ICD-10-CM | POA: Diagnosis not present

## 2021-07-23 DIAGNOSIS — I5022 Chronic systolic (congestive) heart failure: Secondary | ICD-10-CM | POA: Diagnosis not present

## 2021-07-23 DIAGNOSIS — R278 Other lack of coordination: Secondary | ICD-10-CM | POA: Diagnosis not present

## 2021-07-23 DIAGNOSIS — R41841 Cognitive communication deficit: Secondary | ICD-10-CM | POA: Diagnosis not present

## 2021-07-23 DIAGNOSIS — I6601 Occlusion and stenosis of right middle cerebral artery: Secondary | ICD-10-CM | POA: Diagnosis not present

## 2021-07-23 DIAGNOSIS — I69391 Dysphagia following cerebral infarction: Secondary | ICD-10-CM | POA: Diagnosis not present

## 2021-07-23 DIAGNOSIS — R293 Abnormal posture: Secondary | ICD-10-CM | POA: Diagnosis not present

## 2021-07-26 ENCOUNTER — Encounter: Payer: Self-pay | Admitting: Internal Medicine

## 2021-07-26 DIAGNOSIS — R41840 Attention and concentration deficit: Secondary | ICD-10-CM | POA: Diagnosis not present

## 2021-07-26 DIAGNOSIS — S065X3S Traumatic subdural hemorrhage with loss of consciousness of 1 hour to 5 hours 59 minutes, sequela: Secondary | ICD-10-CM | POA: Diagnosis not present

## 2021-07-26 DIAGNOSIS — I69354 Hemiplegia and hemiparesis following cerebral infarction affecting left non-dominant side: Secondary | ICD-10-CM | POA: Diagnosis not present

## 2021-07-26 DIAGNOSIS — R278 Other lack of coordination: Secondary | ICD-10-CM | POA: Diagnosis not present

## 2021-07-26 DIAGNOSIS — R293 Abnormal posture: Secondary | ICD-10-CM | POA: Diagnosis not present

## 2021-07-26 DIAGNOSIS — I5032 Chronic diastolic (congestive) heart failure: Secondary | ICD-10-CM | POA: Diagnosis not present

## 2021-07-26 DIAGNOSIS — I69391 Dysphagia following cerebral infarction: Secondary | ICD-10-CM | POA: Diagnosis not present

## 2021-07-26 DIAGNOSIS — I10 Essential (primary) hypertension: Secondary | ICD-10-CM | POA: Diagnosis not present

## 2021-07-26 DIAGNOSIS — I69812 Visuospatial deficit and spatial neglect following other cerebrovascular disease: Secondary | ICD-10-CM | POA: Diagnosis not present

## 2021-07-26 DIAGNOSIS — I5022 Chronic systolic (congestive) heart failure: Secondary | ICD-10-CM | POA: Diagnosis not present

## 2021-07-26 DIAGNOSIS — I6601 Occlusion and stenosis of right middle cerebral artery: Secondary | ICD-10-CM | POA: Diagnosis not present

## 2021-07-26 DIAGNOSIS — R41841 Cognitive communication deficit: Secondary | ICD-10-CM | POA: Diagnosis not present

## 2021-07-26 DIAGNOSIS — M6389 Disorders of muscle in diseases classified elsewhere, multiple sites: Secondary | ICD-10-CM | POA: Diagnosis not present

## 2021-07-26 DIAGNOSIS — G9341 Metabolic encephalopathy: Secondary | ICD-10-CM | POA: Diagnosis not present

## 2021-07-26 DIAGNOSIS — H538 Other visual disturbances: Secondary | ICD-10-CM | POA: Diagnosis not present

## 2021-07-26 LAB — COMPREHENSIVE METABOLIC PANEL: Calcium: 9 (ref 8.7–10.7)

## 2021-07-26 LAB — BASIC METABOLIC PANEL
BUN: 23 — AB (ref 4–21)
CO2: 25 — AB (ref 13–22)
Chloride: 108 (ref 99–108)
Creatinine: 0.6 (ref 0.6–1.3)
Glucose: 95
Potassium: 4.2 mEq/L (ref 3.5–5.1)
Sodium: 141 (ref 137–147)

## 2021-07-26 NOTE — Progress Notes (Unsigned)
Location:   [place] Room Number: 127A Place of Service:  SNF [phone]) Provider:  Veleta Miners, MD  [name], MD  Patient Care Team: [name], MD as PCP - General (Internal Medicine) Larey Dresser, MD as PCP - Cardiology (Cardiology) [name], MD as Consulting Physician (Ophthalmology) [name], MD (Internal Medicine)  Extended Emergency Contact Information Primary Emergency Contact: [name] Address: [address]          [place], [place] [address] [place] Phone: [phone] Mobile Phone: [phone] Relation: Spouse Secondary Emergency Contact: [name], [place] [address] [name] of [place] Phone: [phone] Relation: Son  Code Status:  DNR Goals of care: Advanced Directive information    07/26/2021   12:03 PM  Advanced Directives  Does Patient Have a Medical Advance Directive? Yes  Type of Paramedic of [place];Living will;Out of facility DNR (pink MOST or yellow form)  Does patient want to make changes to medical advance directive? No - Patient declined  Copy of [place] in Chart? Yes - validated most recent copy scanned in chart (See row information)     Chief Complaint  Patient presents with   Medical Management of Chronic Issues    Routine follow up    HPI:  Pt is a 81 y.o. male seen today for medical management of chronic diseases.     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    Other and unspecified hyperlipidemia    Pre-diabetes    Previous back surgery 1978, jan 2007   Psychosexual dysfunction with inhibited sexual excitement    SOB (shortness of breath)    "with heavy exercise"   Stroke ([place]) 08/2013   "I WAS IN AFIB AND THREW A CLOT, THE EFFECTS WERE TRANSITORY AND DIDNT LAST BUT FOR 30 MINUTES"    Thoracic aortic aneurysm [facility])    Past Surgical History:  Procedure Laterality Date   AORTIC AND MITRAL VALVE REPLACEMENT     09/2004   CARDIOVERSION     3 times from 2004-2006   CARDIOVERSION N/A 09/26/2013   Procedure: CARDIOVERSION;  Surgeon: Larey Dresser, MD;  Location: [place];  Service: Cardiovascular;  Laterality: N/A;   CARDIOVERSION N/A 06/19/2014   Procedure: CARDIOVERSION;  Surgeon: Jerline Pain, MD;  Location: [place];  Service: Cardiovascular;  Laterality: N/A;   CARDIOVERSION N/A 04/24/2017   Procedure: CARDIOVERSION;  Surgeon: Larey Dresser, MD;  Location: [facility] ENDOSCOPY;  Service: Cardiovascular;  Laterality: N/A;   CARDIOVERSION N/A 06/08/2017   Procedure: CARDIOVERSION;  Surgeon: Pixie Casino, MD;  Location: [facility] ENDOSCOPY;  Service: Cardiovascular;  Laterality: N/A;   CARDIOVERSION N/A 08/24/2017   Procedure: CARDIOVERSION;  Surgeon: Skeet Latch, MD;  Location: [place];  Service: Cardiovascular;  Laterality:  N/A;   CARDIOVERSION N/A 02/14/2018   Procedure: CARDIOVERSION;  Surgeon: Larey Dresser, MD;  Location: [facility] ENDOSCOPY;  Service: Cardiovascular;  Laterality: N/A;   COLONOSCOPY     CRANIOTOMY N/A 07/28/2020   Procedure: CRANIOTOMY FOR EVACUATION OF SUBDURAL HEMATOMA;  Surgeon: Eustace Hemmelgarn, MD;  Location: [place];  Service: Neurosurgery;  Laterality: N/A;   IR ANGIO VERTEBRAL SEL SUBCLAVIAN INNOMINATE UNI R MOD SED  08/07/2020   IR CT HEAD LTD  08/07/2020   IR PERCUTANEOUS ART THROMBECTOMY/INFUSION INTRACRANIAL INC DIAG ANGIO  08/07/2020    laminectomies     10/1975 and in 03/2005   RADIOLOGY WITH ANESTHESIA N/A 08/07/2020   Procedure: IR WITH ANESTHESIA;  Surgeon: Luanne Bras, MD;  Location: [place];  Service: Radiology;  Laterality: N/A;   TEE WITHOUT CARDIOVERSION N/A 12/18/2020   Procedure: TRANSESOPHAGEAL ECHOCARDIOGRAM (TEE);  Surgeon: Larey Dresser, MD;  Location: [facility] ENDOSCOPY;  Service: Cardiovascular;  Laterality: N/A;   [place] Right 04/03/2018   Procedure: TOTAL HIP ARTHROPLASTY ANTERIOR APPROACH;  Surgeon: Paralee Cancel, MD;  Location: [facility] ORS;  Service: Orthopedics;  Laterality: Right;  70 mins    Allergies  Allergen Reactions   Naproxen Hives   No Healthtouch Food Allergies Other (See Comments)    Scallops - distress, nausea and vomitting    Allergies as of 07/26/2021       Reactions   Naproxen Hives   No Healthtouch Food Allergies Other (See Comments)   Scallops - distress, nausea and vomitting        Medication List        Accurate as of Jul 26, 2021 12:05 PM. If you have any questions, ask your nurse or doctor.          STOP taking these medications    Co Q 10 10 MG Caps Stopped by: [name], MD   Crestor 20 MG tablet Generic drug: rosuvastatin Stopped by: [name], MD   Cyanocobalamin ER 5000 MCG Tbcr Stopped by: [name], MD   docusate sodium 100 MG capsule Commonly known as: COLACE Stopped by: [name], MD   Fish Oil 1000 MG Caps Stopped by: [name], MD   furosemide 20 MG tablet Commonly known as: LASIX Stopped by: [name], MD   losartan 25 MG tablet Commonly known as: COZAAR Stopped by: [name], MD   Multaq 400 MG tablet Generic drug: dronedarone Stopped by: [name], MD   Ranexa 500 MG 12 hr tablet Generic drug: ranolazine Stopped by: [name], MD   Saw [place]to Dexter by: [name], MD   tamsulosin 0.4 MG Caps capsule Commonly known  as: FLOMAX Stopped by: [name], MD   trimethoprim 100 MG tablet Commonly known as: TRIMPEX Stopped by: [name], MD   warfarin 5 MG tablet Commonly known as: COUMADIN Stopped by: [name], MD       TAKE these medications    amantadine 100 MG capsule Commonly known as: SYMMETREL Take 100 mg by mouth 2 (two) times daily. Between 8-11 am and 12-3 pm   amLODipine 5 MG tablet Commonly known as: NORVASC Take 5 mg by mouth daily.   amoxicillin 400 MG/5ML suspension Commonly known as: AMOXIL Take 500 mg by mouth 2 (two) times daily.   apixaban 5 MG Tabs tablet Commonly known as: ELIQUIS Take 1 tablet (  5 mg total) by mouth 2 (two) times daily.   Armodafinil 50 MG tablet Take 2 tablets (100 mg total) by mouth daily.   atorvastatin 80 MG tablet Commonly known as: LIPITOR Take 1 tablet (80 mg total) by mouth daily.   BIOTENE MOISTURIZING MOUTH MT Use as directed 2 sprays in the mouth or throat in the morning, at noon, and at bedtime.   bisacodyl 10 MG suppository Commonly known as: DULCOLAX Place 1 suppository (10 mg total) rectally daily as needed for moderate constipation or mild constipation.   clobetasol 0.05 % external solution Commonly known as: TEMOVATE SMARTSIG:Topical 1-2 Times Daily PRN   dofetilide 125 MCG capsule Commonly known as: TIKOSYN Take 3 capsules (375 mcg total) by mouth 2 (two) times daily. What changed: Another medication with the same name was removed. Continue taking this medication, and follow the directions you see here. Changed by: [name], MD   famotidine 20 MG tablet Commonly known as: PEPCID Take 20 mg by mouth at bedtime as needed for indigestion.   finasteride 5 MG tablet Commonly known as: PROSCAR Take 1 tablet (5 mg total) by mouth daily.   Glucerna Liqd Take 237 mLs by mouth daily.   hydrocortisone 2.5 % cream SMARTSIG:sparingly Topical Twice Daily What changed: Another medication with the same name  was removed. Continue taking this medication, and follow the directions you see here. Changed by: [name], MD   lacosamide 50 MG Tabs tablet Commonly known as: VIMPAT Take 1 tablet (50 mg total) by mouth 2 (two) times daily.   levalbuterol 0.63 MG/3ML nebulizer solution Commonly known as: XOPENEX Take 0.63 mg by nebulization every 6 (six) hours as needed for wheezing or shortness of breath.   magnesium gluconate 500 MG tablet Commonly known as: MAGONATE Take 1 tablet (500 mg total) by mouth daily.   melatonin 5 MG Tabs Take 5 mg by mouth at bedtime.   methenamine 1 g tablet Commonly known as: HIPREX Take 1 g by mouth every 12 (twelve) hours.   multivitamin with minerals Tabs tablet Take 1 tablet by mouth daily.   polyethylene glycol 17 g packet Commonly known as: MIRALAX / GLYCOLAX Take 17 g by mouth every other day.   potassium chloride 10 MEQ tablet Commonly known as: KLOR-CON M Take 1 tablet (10 mEq total) by mouth daily. What changed: Another medication with the same name was removed. Continue taking this medication, and follow the directions you see here. Changed by: [name], MD   senna-docusate 8.6-50 MG tablet Commonly known as: Senokot-S Take 1 tablet by mouth at bedtime as needed.   Systane 0.4-0.3 % Soln Generic drug: Polyethyl Glycol-Propyl Glycol Place 2 drops into both eyes daily.   torsemide 20 MG tablet Commonly known as: DEMADEX Take 20 mg by mouth daily.        Review of Systems  Immunization History  Administered Date(s) Administered   Fluad Quad(high Dose 65+) 12/06/2018, 12/09/2020   Influenza Whole 12/25/2008, 01/05/2010, 12/30/2011   Influenza, High Dose Seasonal PF 12/14/2016   Influenza,inj,Quad PF,6+ Mos 12/19/2013   Influenza,inj,quad, With Preservative 11/22/2017   Influenza-Unspecified 12/14/2015, 12/24/2019   Moderna Covid-19 Vaccine Bivalent Booster 19yr & up 01/12/2021   Moderna SARS-COV2 Booster Vaccination  01/02/2020, 06/26/2020   Moderna Sars-Covid-2 Vaccination 04/08/2019, 05/05/2019   Pneumococcal Conjugate-13 12/19/2013   Pneumococcal Polysaccharide-23 09/24/2007, 05/22/2017   Td 12/25/2008   Tdap 03/22/2018   Zoster Recombinat (Shingrix) 07/16/2019, 10/28/2019   Zoster, Live 09/24/2007  Pertinent  Health Maintenance Due  Topic Date Due   FOOT EXAM  Never done   OPHTHALMOLOGY EXAM  Never done   INFLUENZA VACCINE  10/05/2021   HEMOGLOBIN A1C  01/05/2022   COLONOSCOPY (Pts 45-70yr Insurance coverage will need to be confirmed)  Discontinued      02/24/2021    3:45 PM 05/11/2021   10:54 AM 06/02/2021    2:07 PM 07/12/2021   10:39 AM 07/14/2021    2:11 PM  Fall Risk  Falls in the past year? 0 1 0  0  Was there an injury with Fall? 0 1   0  Was there an injury with Fall? - Comments  Per wife; pt hit head, had a brain bleed     Fall Risk Category Calculator 0 2   0  Fall Risk Category Low Moderate   Low  Patient Fall Risk Level High fall risk High fall risk  High fall risk   Patient at Risk for Falls Due to Impaired mobility Impaired balance/gait;Impaired mobility  Impaired mobility   Fall risk Follow up Falls evaluation completed Falls evaluation completed      Functional Status Survey:    Vitals:   07/26/21 1106  Weight: 145 lb 3.2 oz (65.9 kg)  Height: '5\' 7"'$  (1.702 m)   Body mass index is 22.74 kg/m. Physical Exam  Labs reviewed: Recent Labs    12/18/20 1630 12/19/20 0610 12/19/20 1632 12/20/20 0153 01/26/21 0435 01/27/21 0538 02/01/21 0000 02/08/21 0000 02/09/21 0000 05/11/21 1132 05/18/21 0000 07/12/21 0000 07/12/21 1056 07/19/21 0000  NA  --  139  --    < > 143 140   < > 150*   < > 139   < > 142 142 143  K  --  3.5  --    < > 3.8 3.5   < > 3.7   < > 4.1   < > 4.0 3.8 4.1  CL  --  108  --    < > 104 107   < > 107   < > 103   < > 107 107 108  CO2  --  23  --    < > 26 25   < > 22   < > 26   < > 24* 27 23*  GLUCOSE  --  186*  --    < > 105* 137*  --   --    --  101*  --   --  145*  --   BUN  --  16  --    < > 12 15   < > 23*   < > 18   < > 23* 24 19  CREATININE  --  0.76  --    < > 0.95 1.06   < > 0.9   < > 0.83   < > 0.7 0.80 0.7  CALCIUM  --  7.7*  --    < > 8.7* 8.7*   < > 9.9   < > 9.3   < > 9.1 9.3 9.4  MG 1.9 2.0 1.9   < > 2.2 2.4  --  2.1  --   --   --   --   --   --   PHOS 3.1 2.7 2.4*  --   --   --   --   --   --   --   --   --   --   --    < > =  values in this interval not displayed.   Recent Labs    01/23/21 1355 02/08/21 0000 02/23/21 0000 03/09/21 0000 04/19/21 0000 05/11/21 1132 07/12/21 1056  AST 37   < > 32 '23 23 23 22  '$ ALT 34   < > 35 '23 20 21 22  '$ ALKPHOS 107   < > 244* 230* 175*  --   --   BILITOT 0.7  --   --   --   --  0.7 0.7  PROT 6.9  --   --   --   --  7.1 7.2  ALBUMIN 2.4*   < > 3.5 3.6 3.6  --   --    < > = values in this interval not displayed.   Recent Labs    12/16/20 0130 12/18/20 0221 02/05/21 1111 02/08/21 0000 04/19/21 0000 05/11/21 1132 07/12/21 1056  WBC 10.7*   < > 9.8   < > 10.3 8.3 9.0  NEUTROABS 7.7  --   --   --   --  5,843 6,804  HGB 9.9*   < > 9.8*   < > 10.3* 11.4* 10.9*  HCT 30.7*   < > 31.8*   < > 30* 35.6* 33.9*  MCV 88.0   < > 85.0  --   --  86.0 90.4  PLT 106*   < > 462*   < > 282 295 426*   < > = values in this interval not displayed.   Lab Results  Component Value Date   TSH 1.773 01/24/2021   Lab Results  Component Value Date   HGBA1C 5.7 07/05/2021   Lab Results  Component Value Date   CHOL 110 07/05/2021   HDL 43 07/05/2021   LDLCALC 57 07/05/2021   TRIG 51 07/05/2021   CHOLHDL 2.8 08/08/2020    Significant Diagnostic Results in last 30 days:  No results found.  Assessment/Plan There are no diagnoses linked to this encounter.   Family/ staff Communication:   Labs/tests ordered:

## 2021-07-27 DIAGNOSIS — G9341 Metabolic encephalopathy: Secondary | ICD-10-CM | POA: Diagnosis not present

## 2021-07-27 DIAGNOSIS — R293 Abnormal posture: Secondary | ICD-10-CM | POA: Diagnosis not present

## 2021-07-27 DIAGNOSIS — R41841 Cognitive communication deficit: Secondary | ICD-10-CM | POA: Diagnosis not present

## 2021-07-27 DIAGNOSIS — I69354 Hemiplegia and hemiparesis following cerebral infarction affecting left non-dominant side: Secondary | ICD-10-CM | POA: Diagnosis not present

## 2021-07-27 DIAGNOSIS — I6601 Occlusion and stenosis of right middle cerebral artery: Secondary | ICD-10-CM | POA: Diagnosis not present

## 2021-07-27 DIAGNOSIS — I69812 Visuospatial deficit and spatial neglect following other cerebrovascular disease: Secondary | ICD-10-CM | POA: Diagnosis not present

## 2021-07-27 DIAGNOSIS — I69391 Dysphagia following cerebral infarction: Secondary | ICD-10-CM | POA: Diagnosis not present

## 2021-07-27 DIAGNOSIS — R278 Other lack of coordination: Secondary | ICD-10-CM | POA: Diagnosis not present

## 2021-07-27 DIAGNOSIS — I5022 Chronic systolic (congestive) heart failure: Secondary | ICD-10-CM | POA: Diagnosis not present

## 2021-07-27 DIAGNOSIS — M6389 Disorders of muscle in diseases classified elsewhere, multiple sites: Secondary | ICD-10-CM | POA: Diagnosis not present

## 2021-07-27 DIAGNOSIS — S065X3S Traumatic subdural hemorrhage with loss of consciousness of 1 hour to 5 hours 59 minutes, sequela: Secondary | ICD-10-CM | POA: Diagnosis not present

## 2021-07-27 DIAGNOSIS — R41840 Attention and concentration deficit: Secondary | ICD-10-CM | POA: Diagnosis not present

## 2021-07-27 DIAGNOSIS — I5032 Chronic diastolic (congestive) heart failure: Secondary | ICD-10-CM | POA: Diagnosis not present

## 2021-07-27 DIAGNOSIS — H538 Other visual disturbances: Secondary | ICD-10-CM | POA: Diagnosis not present

## 2021-07-28 ENCOUNTER — Ambulatory Visit: Payer: Medicare Other | Admitting: Internal Medicine

## 2021-07-28 DIAGNOSIS — I69812 Visuospatial deficit and spatial neglect following other cerebrovascular disease: Secondary | ICD-10-CM | POA: Diagnosis not present

## 2021-07-28 DIAGNOSIS — I5022 Chronic systolic (congestive) heart failure: Secondary | ICD-10-CM | POA: Diagnosis not present

## 2021-07-28 DIAGNOSIS — M6389 Disorders of muscle in diseases classified elsewhere, multiple sites: Secondary | ICD-10-CM | POA: Diagnosis not present

## 2021-07-28 DIAGNOSIS — I69354 Hemiplegia and hemiparesis following cerebral infarction affecting left non-dominant side: Secondary | ICD-10-CM | POA: Diagnosis not present

## 2021-07-28 DIAGNOSIS — I6601 Occlusion and stenosis of right middle cerebral artery: Secondary | ICD-10-CM | POA: Diagnosis not present

## 2021-07-28 DIAGNOSIS — S065X3S Traumatic subdural hemorrhage with loss of consciousness of 1 hour to 5 hours 59 minutes, sequela: Secondary | ICD-10-CM | POA: Diagnosis not present

## 2021-07-28 DIAGNOSIS — H538 Other visual disturbances: Secondary | ICD-10-CM | POA: Diagnosis not present

## 2021-07-28 DIAGNOSIS — R41841 Cognitive communication deficit: Secondary | ICD-10-CM | POA: Diagnosis not present

## 2021-07-28 DIAGNOSIS — G9341 Metabolic encephalopathy: Secondary | ICD-10-CM | POA: Diagnosis not present

## 2021-07-28 DIAGNOSIS — R41840 Attention and concentration deficit: Secondary | ICD-10-CM | POA: Diagnosis not present

## 2021-07-28 DIAGNOSIS — R293 Abnormal posture: Secondary | ICD-10-CM | POA: Diagnosis not present

## 2021-07-28 DIAGNOSIS — I69391 Dysphagia following cerebral infarction: Secondary | ICD-10-CM | POA: Diagnosis not present

## 2021-07-28 DIAGNOSIS — R278 Other lack of coordination: Secondary | ICD-10-CM | POA: Diagnosis not present

## 2021-07-28 DIAGNOSIS — I5032 Chronic diastolic (congestive) heart failure: Secondary | ICD-10-CM | POA: Diagnosis not present

## 2021-07-29 DIAGNOSIS — I69391 Dysphagia following cerebral infarction: Secondary | ICD-10-CM | POA: Diagnosis not present

## 2021-07-29 DIAGNOSIS — M6389 Disorders of muscle in diseases classified elsewhere, multiple sites: Secondary | ICD-10-CM | POA: Diagnosis not present

## 2021-07-29 DIAGNOSIS — R278 Other lack of coordination: Secondary | ICD-10-CM | POA: Diagnosis not present

## 2021-07-29 DIAGNOSIS — R293 Abnormal posture: Secondary | ICD-10-CM | POA: Diagnosis not present

## 2021-07-29 DIAGNOSIS — R41840 Attention and concentration deficit: Secondary | ICD-10-CM | POA: Diagnosis not present

## 2021-07-29 DIAGNOSIS — I69812 Visuospatial deficit and spatial neglect following other cerebrovascular disease: Secondary | ICD-10-CM | POA: Diagnosis not present

## 2021-07-29 DIAGNOSIS — H538 Other visual disturbances: Secondary | ICD-10-CM | POA: Diagnosis not present

## 2021-07-29 DIAGNOSIS — I6601 Occlusion and stenosis of right middle cerebral artery: Secondary | ICD-10-CM | POA: Diagnosis not present

## 2021-07-29 DIAGNOSIS — I5032 Chronic diastolic (congestive) heart failure: Secondary | ICD-10-CM | POA: Diagnosis not present

## 2021-07-29 DIAGNOSIS — I69354 Hemiplegia and hemiparesis following cerebral infarction affecting left non-dominant side: Secondary | ICD-10-CM | POA: Diagnosis not present

## 2021-07-29 DIAGNOSIS — G9341 Metabolic encephalopathy: Secondary | ICD-10-CM | POA: Diagnosis not present

## 2021-07-29 DIAGNOSIS — S065X3S Traumatic subdural hemorrhage with loss of consciousness of 1 hour to 5 hours 59 minutes, sequela: Secondary | ICD-10-CM | POA: Diagnosis not present

## 2021-07-29 DIAGNOSIS — R41841 Cognitive communication deficit: Secondary | ICD-10-CM | POA: Diagnosis not present

## 2021-07-29 DIAGNOSIS — I5022 Chronic systolic (congestive) heart failure: Secondary | ICD-10-CM | POA: Diagnosis not present

## 2021-07-30 DIAGNOSIS — R278 Other lack of coordination: Secondary | ICD-10-CM | POA: Diagnosis not present

## 2021-07-30 DIAGNOSIS — R41840 Attention and concentration deficit: Secondary | ICD-10-CM | POA: Diagnosis not present

## 2021-07-30 DIAGNOSIS — I69391 Dysphagia following cerebral infarction: Secondary | ICD-10-CM | POA: Diagnosis not present

## 2021-07-30 DIAGNOSIS — I5032 Chronic diastolic (congestive) heart failure: Secondary | ICD-10-CM | POA: Diagnosis not present

## 2021-07-30 DIAGNOSIS — M6389 Disorders of muscle in diseases classified elsewhere, multiple sites: Secondary | ICD-10-CM | POA: Diagnosis not present

## 2021-07-30 DIAGNOSIS — G9341 Metabolic encephalopathy: Secondary | ICD-10-CM | POA: Diagnosis not present

## 2021-07-30 DIAGNOSIS — I69354 Hemiplegia and hemiparesis following cerebral infarction affecting left non-dominant side: Secondary | ICD-10-CM | POA: Diagnosis not present

## 2021-07-30 DIAGNOSIS — H538 Other visual disturbances: Secondary | ICD-10-CM | POA: Diagnosis not present

## 2021-07-30 DIAGNOSIS — R293 Abnormal posture: Secondary | ICD-10-CM | POA: Diagnosis not present

## 2021-07-30 DIAGNOSIS — S065X3S Traumatic subdural hemorrhage with loss of consciousness of 1 hour to 5 hours 59 minutes, sequela: Secondary | ICD-10-CM | POA: Diagnosis not present

## 2021-07-30 DIAGNOSIS — I5022 Chronic systolic (congestive) heart failure: Secondary | ICD-10-CM | POA: Diagnosis not present

## 2021-07-30 DIAGNOSIS — I69812 Visuospatial deficit and spatial neglect following other cerebrovascular disease: Secondary | ICD-10-CM | POA: Diagnosis not present

## 2021-07-30 DIAGNOSIS — R41841 Cognitive communication deficit: Secondary | ICD-10-CM | POA: Diagnosis not present

## 2021-07-30 DIAGNOSIS — I6601 Occlusion and stenosis of right middle cerebral artery: Secondary | ICD-10-CM | POA: Diagnosis not present

## 2021-08-02 DIAGNOSIS — I38 Endocarditis, valve unspecified: Secondary | ICD-10-CM | POA: Diagnosis not present

## 2021-08-02 LAB — BASIC METABOLIC PANEL
BUN: 25 — AB (ref 4–21)
CO2: 25 — AB (ref 13–22)
Chloride: 108 (ref 99–108)
Creatinine: 0.7 (ref 0.6–1.3)
Glucose: 90
Potassium: 3.8 mEq/L (ref 3.5–5.1)
Sodium: 143 (ref 137–147)

## 2021-08-02 LAB — COMPREHENSIVE METABOLIC PANEL: Calcium: 8.8 (ref 8.7–10.7)

## 2021-08-03 DIAGNOSIS — G9341 Metabolic encephalopathy: Secondary | ICD-10-CM | POA: Diagnosis not present

## 2021-08-03 DIAGNOSIS — I69354 Hemiplegia and hemiparesis following cerebral infarction affecting left non-dominant side: Secondary | ICD-10-CM | POA: Diagnosis not present

## 2021-08-03 DIAGNOSIS — I5032 Chronic diastolic (congestive) heart failure: Secondary | ICD-10-CM | POA: Diagnosis not present

## 2021-08-03 DIAGNOSIS — R41840 Attention and concentration deficit: Secondary | ICD-10-CM | POA: Diagnosis not present

## 2021-08-03 DIAGNOSIS — R278 Other lack of coordination: Secondary | ICD-10-CM | POA: Diagnosis not present

## 2021-08-03 DIAGNOSIS — R293 Abnormal posture: Secondary | ICD-10-CM | POA: Diagnosis not present

## 2021-08-03 DIAGNOSIS — I6601 Occlusion and stenosis of right middle cerebral artery: Secondary | ICD-10-CM | POA: Diagnosis not present

## 2021-08-03 DIAGNOSIS — S065X3S Traumatic subdural hemorrhage with loss of consciousness of 1 hour to 5 hours 59 minutes, sequela: Secondary | ICD-10-CM | POA: Diagnosis not present

## 2021-08-03 DIAGNOSIS — I69391 Dysphagia following cerebral infarction: Secondary | ICD-10-CM | POA: Diagnosis not present

## 2021-08-03 DIAGNOSIS — I5022 Chronic systolic (congestive) heart failure: Secondary | ICD-10-CM | POA: Diagnosis not present

## 2021-08-03 DIAGNOSIS — R41841 Cognitive communication deficit: Secondary | ICD-10-CM | POA: Diagnosis not present

## 2021-08-03 DIAGNOSIS — M6389 Disorders of muscle in diseases classified elsewhere, multiple sites: Secondary | ICD-10-CM | POA: Diagnosis not present

## 2021-08-03 DIAGNOSIS — I69812 Visuospatial deficit and spatial neglect following other cerebrovascular disease: Secondary | ICD-10-CM | POA: Diagnosis not present

## 2021-08-03 DIAGNOSIS — H538 Other visual disturbances: Secondary | ICD-10-CM | POA: Diagnosis not present

## 2021-08-04 DIAGNOSIS — I69812 Visuospatial deficit and spatial neglect following other cerebrovascular disease: Secondary | ICD-10-CM | POA: Diagnosis not present

## 2021-08-04 DIAGNOSIS — I69391 Dysphagia following cerebral infarction: Secondary | ICD-10-CM | POA: Diagnosis not present

## 2021-08-04 DIAGNOSIS — I6601 Occlusion and stenosis of right middle cerebral artery: Secondary | ICD-10-CM | POA: Diagnosis not present

## 2021-08-04 DIAGNOSIS — R278 Other lack of coordination: Secondary | ICD-10-CM | POA: Diagnosis not present

## 2021-08-04 DIAGNOSIS — S065X3S Traumatic subdural hemorrhage with loss of consciousness of 1 hour to 5 hours 59 minutes, sequela: Secondary | ICD-10-CM | POA: Diagnosis not present

## 2021-08-04 DIAGNOSIS — I5022 Chronic systolic (congestive) heart failure: Secondary | ICD-10-CM | POA: Diagnosis not present

## 2021-08-04 DIAGNOSIS — H538 Other visual disturbances: Secondary | ICD-10-CM | POA: Diagnosis not present

## 2021-08-04 DIAGNOSIS — R41841 Cognitive communication deficit: Secondary | ICD-10-CM | POA: Diagnosis not present

## 2021-08-04 DIAGNOSIS — I5032 Chronic diastolic (congestive) heart failure: Secondary | ICD-10-CM | POA: Diagnosis not present

## 2021-08-04 DIAGNOSIS — M6389 Disorders of muscle in diseases classified elsewhere, multiple sites: Secondary | ICD-10-CM | POA: Diagnosis not present

## 2021-08-04 DIAGNOSIS — R41840 Attention and concentration deficit: Secondary | ICD-10-CM | POA: Diagnosis not present

## 2021-08-04 DIAGNOSIS — G9341 Metabolic encephalopathy: Secondary | ICD-10-CM | POA: Diagnosis not present

## 2021-08-04 DIAGNOSIS — I69354 Hemiplegia and hemiparesis following cerebral infarction affecting left non-dominant side: Secondary | ICD-10-CM | POA: Diagnosis not present

## 2021-08-04 DIAGNOSIS — R293 Abnormal posture: Secondary | ICD-10-CM | POA: Diagnosis not present

## 2021-08-05 DIAGNOSIS — I6601 Occlusion and stenosis of right middle cerebral artery: Secondary | ICD-10-CM | POA: Diagnosis not present

## 2021-08-05 DIAGNOSIS — R41840 Attention and concentration deficit: Secondary | ICD-10-CM | POA: Diagnosis not present

## 2021-08-05 DIAGNOSIS — I69391 Dysphagia following cerebral infarction: Secondary | ICD-10-CM | POA: Diagnosis not present

## 2021-08-05 DIAGNOSIS — S065X3S Traumatic subdural hemorrhage with loss of consciousness of 1 hour to 5 hours 59 minutes, sequela: Secondary | ICD-10-CM | POA: Diagnosis not present

## 2021-08-05 DIAGNOSIS — I5032 Chronic diastolic (congestive) heart failure: Secondary | ICD-10-CM | POA: Diagnosis not present

## 2021-08-05 DIAGNOSIS — I69354 Hemiplegia and hemiparesis following cerebral infarction affecting left non-dominant side: Secondary | ICD-10-CM | POA: Diagnosis not present

## 2021-08-05 DIAGNOSIS — G9341 Metabolic encephalopathy: Secondary | ICD-10-CM | POA: Diagnosis not present

## 2021-08-05 DIAGNOSIS — R41841 Cognitive communication deficit: Secondary | ICD-10-CM | POA: Diagnosis not present

## 2021-08-05 DIAGNOSIS — M6389 Disorders of muscle in diseases classified elsewhere, multiple sites: Secondary | ICD-10-CM | POA: Diagnosis not present

## 2021-08-05 DIAGNOSIS — R278 Other lack of coordination: Secondary | ICD-10-CM | POA: Diagnosis not present

## 2021-08-05 DIAGNOSIS — R293 Abnormal posture: Secondary | ICD-10-CM | POA: Diagnosis not present

## 2021-08-05 DIAGNOSIS — H538 Other visual disturbances: Secondary | ICD-10-CM | POA: Diagnosis not present

## 2021-08-05 DIAGNOSIS — I5022 Chronic systolic (congestive) heart failure: Secondary | ICD-10-CM | POA: Diagnosis not present

## 2021-08-05 DIAGNOSIS — I69812 Visuospatial deficit and spatial neglect following other cerebrovascular disease: Secondary | ICD-10-CM | POA: Diagnosis not present

## 2021-08-06 DIAGNOSIS — S065X3S Traumatic subdural hemorrhage with loss of consciousness of 1 hour to 5 hours 59 minutes, sequela: Secondary | ICD-10-CM | POA: Diagnosis not present

## 2021-08-06 DIAGNOSIS — G9341 Metabolic encephalopathy: Secondary | ICD-10-CM | POA: Diagnosis not present

## 2021-08-06 DIAGNOSIS — I5032 Chronic diastolic (congestive) heart failure: Secondary | ICD-10-CM | POA: Diagnosis not present

## 2021-08-06 DIAGNOSIS — H538 Other visual disturbances: Secondary | ICD-10-CM | POA: Diagnosis not present

## 2021-08-06 DIAGNOSIS — I69391 Dysphagia following cerebral infarction: Secondary | ICD-10-CM | POA: Diagnosis not present

## 2021-08-06 DIAGNOSIS — R41840 Attention and concentration deficit: Secondary | ICD-10-CM | POA: Diagnosis not present

## 2021-08-06 DIAGNOSIS — R41841 Cognitive communication deficit: Secondary | ICD-10-CM | POA: Diagnosis not present

## 2021-08-06 DIAGNOSIS — M6389 Disorders of muscle in diseases classified elsewhere, multiple sites: Secondary | ICD-10-CM | POA: Diagnosis not present

## 2021-08-06 DIAGNOSIS — R293 Abnormal posture: Secondary | ICD-10-CM | POA: Diagnosis not present

## 2021-08-06 DIAGNOSIS — I69812 Visuospatial deficit and spatial neglect following other cerebrovascular disease: Secondary | ICD-10-CM | POA: Diagnosis not present

## 2021-08-06 DIAGNOSIS — R278 Other lack of coordination: Secondary | ICD-10-CM | POA: Diagnosis not present

## 2021-08-06 DIAGNOSIS — I5022 Chronic systolic (congestive) heart failure: Secondary | ICD-10-CM | POA: Diagnosis not present

## 2021-08-06 DIAGNOSIS — I6601 Occlusion and stenosis of right middle cerebral artery: Secondary | ICD-10-CM | POA: Diagnosis not present

## 2021-08-06 DIAGNOSIS — I69354 Hemiplegia and hemiparesis following cerebral infarction affecting left non-dominant side: Secondary | ICD-10-CM | POA: Diagnosis not present

## 2021-08-09 DIAGNOSIS — I69391 Dysphagia following cerebral infarction: Secondary | ICD-10-CM | POA: Diagnosis not present

## 2021-08-09 DIAGNOSIS — I5022 Chronic systolic (congestive) heart failure: Secondary | ICD-10-CM | POA: Diagnosis not present

## 2021-08-09 DIAGNOSIS — R41841 Cognitive communication deficit: Secondary | ICD-10-CM | POA: Diagnosis not present

## 2021-08-09 DIAGNOSIS — R278 Other lack of coordination: Secondary | ICD-10-CM | POA: Diagnosis not present

## 2021-08-09 DIAGNOSIS — H538 Other visual disturbances: Secondary | ICD-10-CM | POA: Diagnosis not present

## 2021-08-09 DIAGNOSIS — R41840 Attention and concentration deficit: Secondary | ICD-10-CM | POA: Diagnosis not present

## 2021-08-09 DIAGNOSIS — R293 Abnormal posture: Secondary | ICD-10-CM | POA: Diagnosis not present

## 2021-08-09 DIAGNOSIS — I5032 Chronic diastolic (congestive) heart failure: Secondary | ICD-10-CM | POA: Diagnosis not present

## 2021-08-09 DIAGNOSIS — M6389 Disorders of muscle in diseases classified elsewhere, multiple sites: Secondary | ICD-10-CM | POA: Diagnosis not present

## 2021-08-09 DIAGNOSIS — G9341 Metabolic encephalopathy: Secondary | ICD-10-CM | POA: Diagnosis not present

## 2021-08-09 DIAGNOSIS — I6601 Occlusion and stenosis of right middle cerebral artery: Secondary | ICD-10-CM | POA: Diagnosis not present

## 2021-08-09 DIAGNOSIS — S065X3S Traumatic subdural hemorrhage with loss of consciousness of 1 hour to 5 hours 59 minutes, sequela: Secondary | ICD-10-CM | POA: Diagnosis not present

## 2021-08-09 DIAGNOSIS — I38 Endocarditis, valve unspecified: Secondary | ICD-10-CM | POA: Diagnosis not present

## 2021-08-09 DIAGNOSIS — I69354 Hemiplegia and hemiparesis following cerebral infarction affecting left non-dominant side: Secondary | ICD-10-CM | POA: Diagnosis not present

## 2021-08-09 DIAGNOSIS — I69812 Visuospatial deficit and spatial neglect following other cerebrovascular disease: Secondary | ICD-10-CM | POA: Diagnosis not present

## 2021-08-09 LAB — BASIC METABOLIC PANEL
BUN: 21 (ref 4–21)
CO2: 22 (ref 13–22)
Chloride: 108 (ref 99–108)
Creatinine: 0.8 (ref 0.6–1.3)
Glucose: 79
Potassium: 4.1 mEq/L (ref 3.5–5.1)
Sodium: 143 (ref 137–147)

## 2021-08-09 LAB — COMPREHENSIVE METABOLIC PANEL: Calcium: 8.9 (ref 8.7–10.7)

## 2021-08-10 ENCOUNTER — Observation Stay (HOSPITAL_COMMUNITY): Payer: Medicare Other

## 2021-08-10 ENCOUNTER — Emergency Department (HOSPITAL_COMMUNITY): Payer: Medicare Other

## 2021-08-10 ENCOUNTER — Telehealth: Payer: Self-pay | Admitting: Neurology

## 2021-08-10 ENCOUNTER — Other Ambulatory Visit: Payer: Self-pay

## 2021-08-10 ENCOUNTER — Inpatient Hospital Stay (HOSPITAL_COMMUNITY)
Admission: EM | Admit: 2021-08-10 | Discharge: 2021-08-12 | DRG: 100 | Disposition: A | Discharge status: Skilled Nursing Facility | Payer: Medicare Other | Source: Skilled Nursing Facility | Attending: Internal Medicine | Admitting: Internal Medicine

## 2021-08-10 DIAGNOSIS — E119 Type 2 diabetes mellitus without complications: Secondary | ICD-10-CM

## 2021-08-10 DIAGNOSIS — Z7901 Long term (current) use of anticoagulants: Secondary | ICD-10-CM

## 2021-08-10 DIAGNOSIS — I5022 Chronic systolic (congestive) heart failure: Secondary | ICD-10-CM | POA: Diagnosis not present

## 2021-08-10 DIAGNOSIS — R1312 Dysphagia, oropharyngeal phase: Secondary | ICD-10-CM | POA: Diagnosis not present

## 2021-08-10 DIAGNOSIS — S065XAA Traumatic subdural hemorrhage with loss of consciousness status unknown, initial encounter: Secondary | ICD-10-CM | POA: Diagnosis not present

## 2021-08-10 DIAGNOSIS — R6510 Systemic inflammatory response syndrome (SIRS) of non-infectious origin without acute organ dysfunction: Secondary | ICD-10-CM | POA: Diagnosis present

## 2021-08-10 DIAGNOSIS — Z91013 Allergy to seafood: Secondary | ICD-10-CM | POA: Diagnosis not present

## 2021-08-10 DIAGNOSIS — I10 Essential (primary) hypertension: Secondary | ICD-10-CM | POA: Diagnosis present

## 2021-08-10 DIAGNOSIS — Z833 Family history of diabetes mellitus: Secondary | ICD-10-CM | POA: Diagnosis not present

## 2021-08-10 DIAGNOSIS — Z66 Do not resuscitate: Secondary | ICD-10-CM | POA: Diagnosis present

## 2021-08-10 DIAGNOSIS — Z96641 Presence of right artificial hip joint: Secondary | ICD-10-CM | POA: Diagnosis present

## 2021-08-10 DIAGNOSIS — I480 Paroxysmal atrial fibrillation: Secondary | ICD-10-CM | POA: Diagnosis not present

## 2021-08-10 DIAGNOSIS — D649 Anemia, unspecified: Secondary | ICD-10-CM | POA: Diagnosis present

## 2021-08-10 DIAGNOSIS — I69354 Hemiplegia and hemiparesis following cerebral infarction affecting left non-dominant side: Secondary | ICD-10-CM

## 2021-08-10 DIAGNOSIS — Z953 Presence of xenogenic heart valve: Secondary | ICD-10-CM | POA: Diagnosis not present

## 2021-08-10 DIAGNOSIS — Z79899 Other long term (current) drug therapy: Secondary | ICD-10-CM

## 2021-08-10 DIAGNOSIS — E785 Hyperlipidemia, unspecified: Secondary | ICD-10-CM | POA: Diagnosis present

## 2021-08-10 DIAGNOSIS — N179 Acute kidney failure, unspecified: Secondary | ICD-10-CM | POA: Diagnosis not present

## 2021-08-10 DIAGNOSIS — R0689 Other abnormalities of breathing: Secondary | ICD-10-CM | POA: Diagnosis not present

## 2021-08-10 DIAGNOSIS — Z886 Allergy status to analgesic agent status: Secondary | ICD-10-CM | POA: Diagnosis not present

## 2021-08-10 DIAGNOSIS — R4182 Altered mental status, unspecified: Secondary | ICD-10-CM | POA: Diagnosis not present

## 2021-08-10 DIAGNOSIS — I5043 Acute on chronic combined systolic (congestive) and diastolic (congestive) heart failure: Secondary | ICD-10-CM | POA: Diagnosis present

## 2021-08-10 DIAGNOSIS — R7989 Other specified abnormal findings of blood chemistry: Secondary | ICD-10-CM

## 2021-08-10 DIAGNOSIS — R569 Unspecified convulsions: Secondary | ICD-10-CM

## 2021-08-10 DIAGNOSIS — G40909 Epilepsy, unspecified, not intractable, without status epilepticus: Secondary | ICD-10-CM | POA: Diagnosis not present

## 2021-08-10 DIAGNOSIS — Z7401 Bed confinement status: Secondary | ICD-10-CM | POA: Diagnosis not present

## 2021-08-10 DIAGNOSIS — R000 Tachycardia, unspecified: Secondary | ICD-10-CM | POA: Diagnosis not present

## 2021-08-10 DIAGNOSIS — I959 Hypotension, unspecified: Secondary | ICD-10-CM | POA: Diagnosis not present

## 2021-08-10 DIAGNOSIS — A419 Sepsis, unspecified organism: Secondary | ICD-10-CM | POA: Diagnosis present

## 2021-08-10 DIAGNOSIS — D72829 Elevated white blood cell count, unspecified: Secondary | ICD-10-CM | POA: Diagnosis not present

## 2021-08-10 DIAGNOSIS — J189 Pneumonia, unspecified organism: Secondary | ICD-10-CM | POA: Diagnosis not present

## 2021-08-10 DIAGNOSIS — R404 Transient alteration of awareness: Secondary | ICD-10-CM | POA: Diagnosis not present

## 2021-08-10 DIAGNOSIS — I110 Hypertensive heart disease with heart failure: Secondary | ICD-10-CM | POA: Diagnosis present

## 2021-08-10 DIAGNOSIS — I445 Left posterior fascicular block: Secondary | ICD-10-CM | POA: Diagnosis present

## 2021-08-10 DIAGNOSIS — Z8249 Family history of ischemic heart disease and other diseases of the circulatory system: Secondary | ICD-10-CM | POA: Diagnosis not present

## 2021-08-10 DIAGNOSIS — I4819 Other persistent atrial fibrillation: Secondary | ICD-10-CM | POA: Diagnosis present

## 2021-08-10 DIAGNOSIS — G9341 Metabolic encephalopathy: Secondary | ICD-10-CM | POA: Diagnosis present

## 2021-08-10 DIAGNOSIS — J90 Pleural effusion, not elsewhere classified: Secondary | ICD-10-CM | POA: Diagnosis not present

## 2021-08-10 DIAGNOSIS — E118 Type 2 diabetes mellitus with unspecified complications: Secondary | ICD-10-CM

## 2021-08-10 DIAGNOSIS — I5033 Acute on chronic diastolic (congestive) heart failure: Secondary | ICD-10-CM | POA: Diagnosis present

## 2021-08-10 LAB — CBC WITH DIFFERENTIAL/PLATELET
Abs Immature Granulocytes: 0.08 10*3/uL — ABNORMAL HIGH (ref 0.00–0.07)
Basophils Absolute: 0.1 10*3/uL (ref 0.0–0.1)
Basophils Relative: 1 %
Eosinophils Absolute: 0.2 10*3/uL (ref 0.0–0.5)
Eosinophils Relative: 1 %
HCT: 31.6 % — ABNORMAL LOW (ref 39.0–52.0)
Hemoglobin: 10 g/dL — ABNORMAL LOW (ref 13.0–17.0)
Immature Granulocytes: 1 %
Lymphocytes Relative: 4 %
Lymphs Abs: 0.7 10*3/uL (ref 0.7–4.0)
MCH: 30.5 pg (ref 26.0–34.0)
MCHC: 31.6 g/dL (ref 30.0–36.0)
MCV: 96.3 fL (ref 80.0–100.0)
Monocytes Absolute: 0.7 10*3/uL (ref 0.1–1.0)
Monocytes Relative: 5 %
Neutro Abs: 14.3 10*3/uL — ABNORMAL HIGH (ref 1.7–7.7)
Neutrophils Relative %: 88 %
Platelets: 238 10*3/uL (ref 150–400)
RBC: 3.28 MIL/uL — ABNORMAL LOW (ref 4.22–5.81)
RDW: 15.2 % (ref 11.5–15.5)
WBC: 16.1 10*3/uL — ABNORMAL HIGH (ref 4.0–10.5)
nRBC: 0 % (ref 0.0–0.2)

## 2021-08-10 LAB — URINALYSIS, ROUTINE W REFLEX MICROSCOPIC
Bilirubin Urine: NEGATIVE
Glucose, UA: NEGATIVE mg/dL
Hgb urine dipstick: NEGATIVE
Ketones, ur: NEGATIVE mg/dL
Leukocytes,Ua: NEGATIVE
Nitrite: NEGATIVE
Protein, ur: 30 mg/dL — AB
Specific Gravity, Urine: 1.013 (ref 1.005–1.030)
pH: 5 (ref 5.0–8.0)

## 2021-08-10 LAB — COMPREHENSIVE METABOLIC PANEL
ALT: 26 U/L (ref 0–44)
AST: 29 U/L (ref 15–41)
Albumin: 3.3 g/dL — ABNORMAL LOW (ref 3.5–5.0)
Alkaline Phosphatase: 93 U/L (ref 38–126)
Anion gap: 14 (ref 5–15)
BUN: 22 mg/dL (ref 8–23)
CO2: 16 mmol/L — ABNORMAL LOW (ref 22–32)
Calcium: 8.8 mg/dL — ABNORMAL LOW (ref 8.9–10.3)
Chloride: 111 mmol/L (ref 98–111)
Creatinine, Ser: 1.18 mg/dL (ref 0.61–1.24)
GFR, Estimated: >60 mL/min (ref >60)
Glucose, Bld: 148 mg/dL — ABNORMAL HIGH (ref 70–99)
Potassium: 3.8 mmol/L (ref 3.5–5.1)
Sodium: 141 mmol/L (ref 135–145)
Total Bilirubin: 0.5 mg/dL (ref 0.3–1.2)
Total Protein: 6.4 g/dL — ABNORMAL LOW (ref 6.5–8.1)

## 2021-08-10 LAB — LACTIC ACID, PLASMA
Lactic Acid, Venous: 5.3 mmol/L (ref 0.5–1.9)
Lactic Acid, Venous: 3 mmol/L (ref 0.5–1.9)
Lactic Acid, Venous: 1.1 mmol/L (ref 0.5–1.9)

## 2021-08-10 LAB — I-STAT ARTERIAL BLOOD GAS, ED
Acid-base deficit: 8 mmol/L — ABNORMAL HIGH (ref 0.0–2.0)
Bicarbonate: 18.8 mmol/L — ABNORMAL LOW (ref 20.0–28.0)
Calcium, Ion: 1.26 mmol/L (ref 1.15–1.40)
HCT: 29 % — ABNORMAL LOW (ref 39.0–52.0)
Hemoglobin: 9.9 g/dL — ABNORMAL LOW (ref 13.0–17.0)
O2 Saturation: 94 %
Patient temperature: 97.7
Potassium: 3.8 mmol/L (ref 3.5–5.1)
Sodium: 141 mmol/L (ref 135–145)
TCO2: 20 mmol/L — ABNORMAL LOW (ref 22–32)
pCO2 arterial: 40 mmHg (ref 32–48)
pH, Arterial: 7.277 — ABNORMAL LOW (ref 7.35–7.45)
pO2, Arterial: 77 mmHg — ABNORMAL LOW (ref 83–108)

## 2021-08-10 LAB — PROTIME-INR
INR: 1.3 — ABNORMAL HIGH (ref 0.8–1.2)
Prothrombin Time: 15.9 seconds — ABNORMAL HIGH (ref 11.4–15.2)

## 2021-08-10 MED ORDER — LEVETIRACETAM IN NACL 1000 MG/100ML IV SOLN
1000.0000 mg | Freq: Once | INTRAVENOUS | Status: AC
Start: 2021-08-10 — End: 2021-08-10
  Administered 2021-08-10: 1000 mg via INTRAVENOUS
  Filled 2021-08-10: qty 100

## 2021-08-10 MED ORDER — LACTATED RINGERS IV BOLUS
1000.0000 mL | Freq: Once | INTRAVENOUS | Status: AC
  Administered 2021-08-10: 1000 mL via INTRAVENOUS

## 2021-08-10 MED ORDER — ATORVASTATIN CALCIUM 80 MG PO TABS
80.0000 mg | ORAL_TABLET | Freq: Every day | ORAL | Status: DC
  Administered 2021-08-10 – 2021-08-12 (×3): 80 mg via ORAL
  Filled 2021-08-10: qty 1
  Filled 2021-08-10: qty 2
  Filled 2021-08-10: qty 1

## 2021-08-10 MED ORDER — SODIUM CHLORIDE 0.9 % IV SOLN: INTRAVENOUS | Status: DC

## 2021-08-10 MED ORDER — HYDROCORTISONE 2.5 % EX CREA
1.0000 | TOPICAL_CREAM | Freq: Every day | CUTANEOUS | Status: DC | PRN
Start: 2021-08-10 — End: 2021-08-10

## 2021-08-10 MED ORDER — APIXABAN 5 MG PO TABS: 5.0000 mg | ORAL_TABLET | Freq: Two times a day (BID) | ORAL | Status: DC

## 2021-08-10 MED ORDER — SODIUM CHLORIDE 0.9% FLUSH
3.0000 mL | Freq: Two times a day (BID) | INTRAVENOUS | Status: DC
  Administered 2021-08-11 – 2021-08-12 (×3): 3 mL via INTRAVENOUS

## 2021-08-10 MED ORDER — VANCOMYCIN HCL IN DEXTROSE 1-5 GM/200ML-% IV SOLN
1000.0000 mg | INTRAVENOUS | Status: DC
  Filled 2021-08-10: qty 200

## 2021-08-10 MED ORDER — SODIUM CHLORIDE 0.9 % IV SOLN
2.0000 g | Freq: Two times a day (BID) | INTRAVENOUS | Status: DC
  Administered 2021-08-10 – 2021-08-11 (×2): 2 g via INTRAVENOUS
  Filled 2021-08-10 (×2): qty 12.5

## 2021-08-10 MED ORDER — POLYVINYL ALCOHOL 1.4 % OP SOLN: 2.0000 [drp] | Freq: Every day | OPHTHALMIC | Status: DC | PRN

## 2021-08-10 MED ORDER — ENSURE ENLIVE PO LIQD: 237.0000 mL | Freq: Two times a day (BID) | ORAL | Status: DC

## 2021-08-10 MED ORDER — AMLODIPINE BESYLATE 5 MG PO TABS
5.0000 mg | ORAL_TABLET | Freq: Every day | ORAL | Status: DC
  Administered 2021-08-10 – 2021-08-12 (×3): 5 mg via ORAL
  Filled 2021-08-10 (×3): qty 1

## 2021-08-10 MED ORDER — SODIUM CHLORIDE 0.9 % IV SOLN
100.0000 mg | Freq: Two times a day (BID) | INTRAVENOUS | Status: DC
  Administered 2021-08-10 – 2021-08-11 (×2): 100 mg via INTRAVENOUS
  Filled 2021-08-10 (×3): qty 10

## 2021-08-10 MED ORDER — VANCOMYCIN HCL 1500 MG/300ML IV SOLN
1500.0000 mg | Freq: Once | INTRAVENOUS | Status: AC
Start: 2021-08-10 — End: 2021-08-10
  Administered 2021-08-10: 1500 mg via INTRAVENOUS
  Filled 2021-08-10: qty 300

## 2021-08-10 MED ORDER — POLYETHYLENE GLYCOL 3350 17 G PO PACK
17.0000 g | PACK | ORAL | Status: DC
  Administered 2021-08-11: 17 g via ORAL
  Filled 2021-08-10: qty 1

## 2021-08-10 MED ORDER — HEPARIN (PORCINE) 25000 UT/250ML-% IV SOLN
1050.0000 [IU]/h | INTRAVENOUS | Status: AC
  Administered 2021-08-10: 950 [IU]/h via INTRAVENOUS
  Filled 2021-08-10: qty 250

## 2021-08-10 MED ORDER — ACETAMINOPHEN 650 MG RE SUPP: 650.0000 mg | Freq: Four times a day (QID) | RECTAL | Status: DC | PRN

## 2021-08-10 MED ORDER — ONDANSETRON HCL 4 MG PO TABS: 4.0000 mg | ORAL_TABLET | Freq: Four times a day (QID) | ORAL | Status: DC | PRN

## 2021-08-10 MED ORDER — ONDANSETRON HCL 4 MG/2ML IJ SOLN: 4.0000 mg | Freq: Four times a day (QID) | INTRAMUSCULAR | Status: DC | PRN

## 2021-08-10 MED ORDER — FINASTERIDE 5 MG PO TABS
5.0000 mg | ORAL_TABLET | Freq: Every day | ORAL | Status: DC
  Administered 2021-08-10 – 2021-08-12 (×3): 5 mg via ORAL
  Filled 2021-08-10 (×3): qty 1

## 2021-08-10 MED ORDER — AMANTADINE HCL 100 MG PO CAPS
100.0000 mg | ORAL_CAPSULE | Freq: Two times a day (BID) | ORAL | Status: DC
  Administered 2021-08-11 – 2021-08-12 (×4): 100 mg via ORAL
  Filled 2021-08-10 (×5): qty 1

## 2021-08-10 MED ORDER — LEVALBUTEROL HCL 0.63 MG/3ML IN NEBU: 0.6300 mg | INHALATION_SOLUTION | Freq: Four times a day (QID) | RESPIRATORY_TRACT | Status: DC | PRN

## 2021-08-10 MED ORDER — SENNOSIDES-DOCUSATE SODIUM 8.6-50 MG PO TABS
1.0000 | ORAL_TABLET | Freq: Every evening | ORAL | Status: DC | PRN
Start: 2021-08-10 — End: 2021-08-12

## 2021-08-10 MED ORDER — SODIUM CHLORIDE 0.9 % IV SOLN
2.0000 g | Freq: Once | INTRAVENOUS | Status: AC
  Administered 2021-08-10: 2 g via INTRAVENOUS
  Filled 2021-08-10: qty 12.5

## 2021-08-10 MED ORDER — MELATONIN 5 MG PO TABS
5.0000 mg | ORAL_TABLET | Freq: Every day | ORAL | Status: DC
  Administered 2021-08-11: 5 mg via ORAL
  Filled 2021-08-10: qty 1

## 2021-08-10 MED ORDER — ENSURE ENLIVE PO LIQD
237.0000 mL | Freq: Two times a day (BID) | ORAL | Status: DC
  Administered 2021-08-10 – 2021-08-12 (×5): 237 mL via ORAL
  Filled 2021-08-10: qty 237

## 2021-08-10 MED ORDER — ACETAMINOPHEN 325 MG PO TABS: 650.0000 mg | ORAL_TABLET | Freq: Four times a day (QID) | ORAL | Status: DC | PRN

## 2021-08-10 MED ORDER — FAMOTIDINE 20 MG PO TABS: 20.0000 mg | ORAL_TABLET | Freq: Every evening | ORAL | Status: DC | PRN

## 2021-08-10 MED ORDER — TORSEMIDE 20 MG PO TABS
20.0000 mg | ORAL_TABLET | Freq: Every day | ORAL | Status: DC
  Administered 2021-08-11 – 2021-08-12 (×2): 20 mg via ORAL
  Filled 2021-08-10 (×2): qty 1

## 2021-08-10 MED ORDER — DOFETILIDE 250 MCG PO CAPS
375.0000 ug | ORAL_CAPSULE | Freq: Two times a day (BID) | ORAL | Status: DC
  Administered 2021-08-10 – 2021-08-12 (×4): 375 ug via ORAL
  Filled 2021-08-10 (×5): qty 1

## 2021-08-10 MED ORDER — POTASSIUM CHLORIDE CRYS ER 20 MEQ PO TBCR
40.0000 meq | EXTENDED_RELEASE_TABLET | Freq: Once | ORAL | Status: AC
  Administered 2021-08-10: 40 meq via ORAL
  Filled 2021-08-10: qty 2

## 2021-08-10 NOTE — Procedures (Signed)
Patient Name: [name] [name]  MRN: [id number]  Epilepsy Attending: Lora Havens  Referring Physician/Provider: Norval [place], MD Date: 08/10/2021 Duration: 24.01 mins  Patient history: 81 year old male with breakthrough seizure in the setting of possible bronchitis or pneumonia. EEG to evaluate for seizure.  Level of alertness:  lethargic   AEDs during EEG study: LCM  Technical aspects: This EEG study was done with scalp electrodes positioned according to the 10-20 International system of electrode placement. Electrical activity was acquired at a sampling rate of '500Hz'$  and reviewed with a high frequency filter of '70Hz'$  and a low frequency filter of '1Hz'$ . EEG data were recorded continuously and digitally stored.   Description: No clear posterior dominant rhythm was seen. EEG showed continuous 5 to 8 Hz theta-alpha activity in left hemisphere. There is low amplitude 2-'3hz'$  delta slowing in right hemisphere.   Hyperventilation and photic stimulation were not performed.     ABNORMALITY - Continuous slow, generalized and lateralized right hemisphere  IMPRESSION: This study is suggestive of cortical dysfunction arising from right hemisphere likely secondary to underlying structural abnormality. Additionally there is moderate diffuse encephalopathy, nonspecific etiology. No seizures or epileptiform discharges were seen throughout the recording.  Tahiri Shareef Barbra Sarks

## 2021-08-10 NOTE — Progress Notes (Signed)
ANTICOAGULATION CONSULT NOTE - Initial Consult  Pharmacy Consult for heparin Indication: atrial fibrillation  Allergies  Allergen Reactions   Naproxen Hives   No [employer]alt[employer] Food Allergies Other (See Comments)    Scallops - distress, nausea and vomitting    Patient Measurements: Height: '5\' 7"'$  (170.2 cm) Weight: 65.8 kg (145 lb) IBW/kg (Calculated) : 66.1 Heparin Dosing Weight: TBW  Vital Signs: Temp: 97.7 F (36.5 C) (06/06 0929) Temp Source: Rectal (06/06 0929) BP: 116/81 (06/06 1730) Pulse Rate: 79 (06/06 1730)  Labs: Recent Labs    08/10/21 0937 08/10/21 0957  HGB 10.0* 9.9*  HCT 31.6* 29.0*  PLT 238  --   LABPROT 15.9*  --   INR 1.3*  --   CREATININE 1.18  --     Estimated Creatinine Clearance: 48 mL/min (by C-G formula based on SCr of 1.18 mg/dL).   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    Other and unspecified hyperlipidemia    Pre-diabetes    Previous back surgery 1978, jan 2007   Psychosexual dysfunction with inhibited sexual excitement    SOB (shortness of breath)    "with heavy exercise"   Stroke ([place]) 08/2013   "I WAS IN AFIB AND THREW A CLOT, THE EFFECTS WERE TRANSITORY AND DIDNT LAST BUT FOR 30 MINUTES"    Thoracic aortic aneurysm ([place])     Assessment: 81 YOM presenting with encephalopathy with no PO access, on Eliquis PTA for afib, last dose 6/5 '@1900'$   Goal of Therapy:  Heparin level 0.3-0.7 units/ml aPTT 66-102 seconds Monitor  platelets by anticoagulation protocol: Yes   Plan:  Heparin gtt at 950 units/hr, no bolus F/u 8 hour aPTT/HL F/u ability to transition back to [place], PharmD Clinical Pharmacist ED Pharmacist Phone # [phone] 08/10/2021 6:21 PM

## 2021-08-10 NOTE — ED Notes (Signed)
Pts family does not want an NG tube placed until Jody, son gets here at [facility] would like to reevaluate pt and see if he will follow his commands. Pt would not follow RN's commands.

## 2021-08-10 NOTE — Progress Notes (Signed)
EEG complete - results pending 

## 2021-08-10 NOTE — ED Notes (Signed)
Pt is still in CT at this time

## 2021-08-10 NOTE — Progress Notes (Signed)
Pharmacy Antibiotic Note  [name] [name] is a 81 y.o. male admitted on 08/10/2021 with sepsis.  Pt has a recent history of E. Faealis bacteremia with MV endocarditis s/p 6 weeks of IV ampicillin and CTX (EOT 01/29/21). Patient is on suppressive amoxicillin '500mg'$  PO BID indefinitely. Pharmacy has been consulted for Vancomycin and dosing.  Plan: Vancomycin '1500mg'$  IV x 1 given in ED, followed by  Vancomycin '1000mg'$  IV q24h (eAUC~477)   > Goal AUC 400-550 Continue Cefepime 2g IV q12h - per MD Monitor WBC, temp, Scr, and clinical s/sx of infection daily Vancomycin levels once at steady state F/u blood and urine cultures  Height: '5\' 7"'$  (170.2 cm) Weight: 65.8 kg (145 lb) IBW/kg (Calculated) : 66.1  Temp (24hrs), Avg:97.7 F (36.5 C), Min:97.7 F (36.5 C), Max:97.7 F (36.5 C)  Recent Labs  Lab 08/10/21 0937 08/10/21 1038 08/10/21 1213  WBC 16.1*  --   --   CREATININE 1.18  --   --   LATICACIDVEN  --  5.3* 3.0*     Estimated Creatinine Clearance: 48 mL/min (by C-G formula based on SCr of 1.18 mg/dL).    Allergies  Allergen Reactions   Naproxen Hives   No Healt[employer] Food Allergies Other (See Comments)    Scallops - distress, nausea and vomitting    Antimicrobials this admission: Cefepime 6/6 >>  Vancomycin 6/6 >>   Dose adjustments this admission: N/A  Microbiology results: 6/6 BCx: pending 6/6 Ur Cx: pending  Thank you for allowing pharmacy to be a part of this patient's care.  Kaleen Mask 08/10/2021 3:16 PM

## 2021-08-10 NOTE — Telephone Encounter (Signed)
Called Tammy at [facility] who stated she called 911. The patient has already had 3 seizures. I advised her that is what we would strongly advise as well. Tammy verbalized understanding, appreciation.

## 2021-08-10 NOTE — ED Triage Notes (Signed)
Pt BIB EMS due to seizure like activity. 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 Pt recieved '5mg'$  of Versed IM. Pt has DNR form with him. Pt able to open eyes and move right hand on arrival. Pt hypotensive 70/40's on arrival.

## 2021-08-10 NOTE — Progress Notes (Signed)
Pharmacy Antibiotic Note  [name] [name] is a 81 y.o. male admitted on 08/10/2021 with sepsis.  Pt has a recent history of E. Faealis bacteremia with MV endocarditis s/p 6 weeks of IV ampicillin and CTX (EOT 01/29/21). Patient is on suppressive amoxicillin '500mg'$  PO BID indefinitely. Pharmacy has been consulted for empiric Vancomycin and Cefepime dosing to be given once in ED.  Plan: Cefepime 2g IV x 1 Vancomycin '1500mg'$  IV x 1 F/u plan for antibiotic coverage with admitting team  Height: '5\' 7"'$  (170.2 cm) Weight: 65.8 kg (145 lb) IBW/kg (Calculated) : 66.1  Temp (24hrs), Avg:97.7 F (36.5 C), Min:97.7 F (36.5 C), Max:97.7 F (36.5 C)  Recent Labs  Lab 08/10/21 0937  WBC 16.1*  CREATININE 1.18    Estimated Creatinine Clearance: 48 mL/min (by C-G formula based on SCr of 1.18 mg/dL).    Allergies  Allergen Reactions   Naproxen Hives   No Healt[employer] Food Allergies Other (See Comments)    Scallops - distress, nausea and vomitting    Antimicrobials this admission: Cefepime 6/6 >>  Vancomycin 6/6 >>   Dose adjustments this admission: N/A  Microbiology results: 6/6 BCx: pending  Thank you for allowing pharmacy to be a part of this patient's care.  Kaleen Mask 08/10/2021 11:14 AM

## 2021-08-10 NOTE — Telephone Encounter (Signed)
Answering service called stating that [name] [name] from [facility] is needing to speak to the RN or Provider. [name] [name] states that the pt is in chronic A-fib and has had a seizure and is needing a call as soon as possible. Pt looks like he may have another.

## 2021-08-10 NOTE — Consult Note (Signed)
Neurology Consultation Reason for Consult: Seizure Referring Physician: [place]  CC: Seizure  History is obtained from: Wife, chart  HPI: [name] [name] is a 81 y.o. male with a history of a stroke in May 2022 leaving him with left-sided deficits as well as a subdural hematoma later that year he had a seizure at that time and was started on Keppra.  He was subsequently admitted with encephalopathy felt to be multifactorial but predominantly due to sepsis in October.  At that time, his Keppra was changed to lacosamide as it was felt to possibly be contributing to his encephalopathy.  He has been seizure-free since that time, but his wife reports that he started coughing yesterday.  This morning, he was seen to have several breakthrough seizures consisting convulsive activity.  He was given Versed and Keppra in the emergency department.  Since that time, he has been lethargic, but no further seizure activity has been seen.    ROS: Unable to assess secondary to patient's altered mental status.     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    Other and unspecified hyperlipidemia    Pre-diabetes    Previous back surgery 1978, jan 2007   Psychosexual dysfunction with inhibited sexual excitement    SOB (shortness of breath)    "with heavy exercise"   Stroke ([place]) 08/2013   "I WAS IN AFIB AND THREW A CLOT, THE EFFECTS WERE TRANSITORY AND DIDNT LAST BUT FOR 74 MINUTES"    Thoracic aortic aneurysm ([place])       Family History  Problem Relation Age of Onset   Macular degeneration Mother        macular degeneration   Cancer Father        intestinal/GI   Diabetes Paternal Aunt    Heart attack Maternal Grandfather    Diabetes Brother      Social History:  reports that he has never smoked. He has never used smokeless tobacco. He reports that he does not drink alcohol and does not use drugs.   Exam: Current vital signs: BP 114/77   Pulse 79   Temp 97.7 F (36.5 C) (Rectal)   Resp (!) 22   Ht '5\' 7"'$  (1.702 m)   Wt 65.8 kg   SpO2 100%   BMI 22.71 kg/m  Vital signs in last 24 hours: Temp:  [97.7 F (36.5 C)] 97.7 F (36.5 C) (06/06 0929) Pulse Rate:  [78-102] 79 (06/06 1515) Resp:  [14-25] 22 (06/06 1515) BP: (77-114)/(60-80) 114/77 (06/06 1515) SpO2:  [91 %-100 %] 100 % (06/06 1515) Weight:  [65.8 kg] 65.8 kg (06/06 1100)   Physical Exam  Constitutional: Appears well-developed and well-nourished.   Neuro: Mental Status: I suspect that he has some degree of eyelid apraxia as his eyes remain closed despite him following commands.  He follows commands to show thumb and wiggle toes on the  right.  Speech is mumbling, but I am able to make out yes/nos. Cranial Nerves: II: He does not cooperate with visual field testing, does not blink to threat from either side. Pupils are equal, round, and reactive to light.   III,IV, VI: Eyes are midline, no deviation, resists doll's eye VII: Facial movement is decreased in the left Motor: He has minimal withdrawal versus flexion to noxious stimulation of the left leg, minimal flexion left upper extremity sensory: As above  Cerebellar: Does not perform     I have reviewed labs in epic and the results pertinent to this consultation are: Creatinine 1.18  I have reviewed the images obtained: CT head-old large right MCA stroke  Impression: 81 year old male with breakthrough seizure in the setting of possible bronchitis or pneumonia.  My  suspicion is that his seizure threshold was lowered, contributing to the breakthrough.  He is on a very low-dose of Vimpat and I would recommend increasing this.  EEG was initially recommended, but is available at the time of finalizing this note and is negative for ongoing seizure.  Recommendations: 1) EEG(available at the time of finalizing this note and is negative) 2) increase Vimpat to 100 mg twice daily 3) assessment/treatment of pulmonary infection per IM 4) will follow.    Roland Rack, MD [facility] [phone]  If 7pm- 7am, please page neurology on call as listed in [place].

## 2021-08-10 NOTE — H&P (Signed)
History and Physical    Patient: [name] [name] [id number] DOB: 1941/01/31 DOA: 08/10/2021 DOS: the patient was seen and examined on 08/10/2021 PCP: [name], MD  Patient coming from: [place] via EMS  Chief Complaint:  Chief Complaint  Patient presents with   Seizures   HPI: [name] [name] is a 81 y.o. 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 Patient witnessed to have having seizure-like activity this morning.  She reports that his right arm was shaking uncontrollably.  Symptoms resolved temporarily before recurring and his wife notes that point both arms were shaking.  It was reported this lasted on for approximately 2 hours, but wife notes that it lasted approximately 30 minutes.  He had also reportedly been able to talk to his son during the  episode.  At baseline patient is able to answer questions and talk, but does not carry on in-depth conversations.  He has left-sided arm weakness as it is related to prior strokes.  He progressing with his physical abilities and was able to stand with assistance in the last month.  He is on a dysphagia 3 diet.  Family makes note that the patient has been fairly stable and the last time he had had a seizure was following his stroke for which she was started on Vimpat and had had no subsequent seizures until today.  Patient has a DO NOT RESUSCITATE order in place which family confirms.  In route with EMS patient had been given Versed 5 mg IM.  Blood pressures were noted to be as low as 77/40.  Upon admission into the emergency department patient was seen to be afebrile with pulse  88-1 02, respirations 17-25, blood pressure 77/60-104/76, and O2 saturations currently maintained on 2 L of nasal cannula oxygen.  Labs significant for WBC 16.1, hemoglobin 10, CO2 16, BUN 22,  creatinine 1.18, and lactic acid 5.3.  Urinalysis noted few bacteria present with 11-20 WBCs.  Chest x-ray noted improving patchy bilateral infiltrates.  CT scan of the brain noted no acute abnormality and prior right MCA territory and left parietal infarcts.  Blood cultures were obtained.  Patient had been given 1 L lactated Ringer's, Keppra 1 g IV, vancomycin, and cefepime.  Review of Systems: unable to review all systems due to the inability of the patient to answer questions. 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    Other and unspecified hyperlipidemia    Pre-diabetes    Previous back surgery 1978, jan 2007   Psychosexual dysfunction with inhibited sexual excitement    SOB (shortness of breath)    "  with heavy exercise"   Stroke ([place]) 08/2013   "I WAS IN AFIB AND THREW A CLOT, THE EFFECTS WERE TRANSITORY AND DIDNT LAST BUT FOR 30 MINUTES"    Thoracic aortic aneurysm [facility])    Past Surgical History:  Procedure Laterality Date   AORTIC AND MITRAL VALVE REPLACEMENT     09/2004   CARDIOVERSION     3 times from 2004-2006   CARDIOVERSION N/A 09/26/2013   Procedure: CARDIOVERSION;  Surgeon: Larey Dresser, MD;  Location: [place];  Service: Cardiovascular;  Laterality: N/A;   CARDIOVERSION N/A 06/19/2014    Procedure: CARDIOVERSION;  Surgeon: Jerline Pain, MD;  Location: [place];  Service: Cardiovascular;  Laterality: N/A;   CARDIOVERSION N/A 04/24/2017   Procedure: CARDIOVERSION;  Surgeon: Larey Dresser, MD;  Location: [place];  Service: Cardiovascular;  Laterality: N/A;   CARDIOVERSION N/A 06/08/2017   Procedure: CARDIOVERSION;  Surgeon: Pixie Casino, MD;  Location[facility] ENDOSCOPY;  Service: Cardiovascular;  Laterality: N/A;   CARDIOVERSION N/A 08/24/2017   Procedure: CARDIOVERSION;  Surgeon: Skeet Latch, MD;  Location: [facility] ENDOSCOPY;  Service: Cardiovascular;  Laterality: N/A;   CARDIOVERSION N/A 02/14/2018   Procedure: CARDIOVERSION;  Surgeon: Larey Dresser, MD;  Location: [facility] ENDOSCOPY;  Service: Cardiovascular;  Laterality: N/A;   COLONOSCOPY     CRANIOTOMY N/A 07/28/2020   Procedure: CRANIOTOMY FOR EVACUATION OF SUBDURAL HEMATOMA;  Surgeon: Eustace Keirsey, MD;  Location: [place];  Service: Neurosurgery;  Laterality: N/A;   IR ANGIO VERTEBRAL SEL SUBCLAVIAN INNOMINATE UNI R MOD SED  08/07/2020   IR CT HEAD LTD  08/07/2020   IR PERCUTANEOUS ART THROMBECTOMY/INFUSION INTRACRANIAL INC DIAG ANGIO  08/07/2020   laminectomies     10/1975 and in 03/2005   RADIOLOGY WITH ANESTHESIA N/A 08/07/2020   Procedure: IR WITH ANESTHESIA;  Surgeon: Luanne Bras, MD;  Location: [place];  Service: Radiology;  Laterality: N/A;   TEE WITHOUT CARDIOVERSION N/A 12/18/2020   Procedure: TRANSESOPHAGEAL ECHOCARDIOGRAM (TEE);  Surgeon: Larey Dresser, MD;  Location: [facility] ENDOSCOPY;  Service: Cardiovascular;  Laterality: N/A;   [place] Right 04/03/2018   Procedure: TOTAL HIP ARTHROPLASTY ANTERIOR APPROACH;  Surgeon: Paralee Cancel, MD;  Location: [facility];  Service: Orthopedics;  Laterality: Right;  70 mins   Social History:  reports that he has never smoked. He has never used smokeless tobacco. He reports that he does not drink alcohol and does not use  drugs.  Allergies  Allergen Reactions   Naproxen Hives   No [employer] Food Allergies Other (See Comments)    Scallops - distress, nausea and vomitting    Family History  Problem Relation Age of Onset   Macular degeneration Mother        macular degeneration   Cancer Father        intestinal/GI   Diabetes Paternal Aunt    Heart attack Maternal Grandfather    Diabetes Brother     Prior to Admission medications   Medication Sig Start Date End Date Taking? Authorizing Provider  amantadine (SYMMETREL) 100 MG capsule Take 100 mg by mouth 2 (two) times daily. Between 8-11 am and 12-3 pm    [provider]  amLODipine (NORVASC) 5 MG tablet Take 5 mg by mouth daily.    [provider]  amoxicillin (AMOXIL) 400 MG/5ML suspension Take 500 mg by mouth 2 (two) times daily.    [provider]  apixaban (ELIQUIS) 5 MG TABS  tablet Take 1 tablet (5 mg total) by mouth 2 (two) times daily. 01/04/21   Thurnell Lose, MD  Armodafinil 50 MG tablet Take 2 tablets (100 mg total) by mouth daily. 06/09/21   Dohmeier, Asencion Partridge, MD  Artificial Saliva (BIOTENE MOISTURIZING MOUTH MT) Use as directed 2 sprays in the mouth or throat in the morning, at noon, and at bedtime.    [provider]  atorvastatin (LIPITOR) 80 MG tablet Take 1 tablet (80 mg total) by mouth daily. 01/04/21   Thurnell Lose, MD  bisacodyl (DULCOLAX) 10 MG suppository Place 1 suppository (10 mg total) rectally daily as needed for moderate constipation or mild constipation. 01/27/21   Hongalgi, Lenis Dickinson, MD  clobetasol (TEMOVATE) 0.05 % external solution SMARTSIG:Topical 1-2 Times Daily PRN 05/10/21   [provider]  dofetilide (TIKOSYN) 125 MCG capsule Take 3 capsules (375 mcg total) by mouth 2 (two) times daily. 01/04/21   Thurnell Lose, MD  famotidine (PEPCID) 20 MG tablet Take 20 mg by mouth at bedtime as needed for indigestion.    [provider]  finasteride (PROSCAR) 5 MG tablet  Take 1 tablet (5 mg total) by mouth daily. 01/04/21   Thurnell Lose, MD  Glucerna (GLUCERNA) LIQD Take 237 mLs by mouth daily.    [provider]  hydrocortisone 2.5 % cream SMARTSIG:sparingly Topical Twice Daily 05/10/21   [provider]  lacosamide (VIMPAT) 50 MG TABS tablet Take 1 tablet (50 mg total) by mouth 2 (two) times daily. 05/31/21   Royal Hawthorn, NP  levalbuterol Penne Lash) 0.63 MG/3ML nebulizer solution Take 0.63 mg by nebulization every 6 (six) hours as needed for wheezing or shortness of breath.    [provider]  magnesium gluconate (MAGONATE) 500 MG tablet Take 1 tablet (500 mg total) by mouth daily. 01/04/21   Thurnell Lose, MD  melatonin 5 MG TABS Take 5 mg by mouth at bedtime.    [provider]  methenamine (HIPREX) 1 g tablet Take 1 g by mouth every 12 (twelve) hours.    [provider]  Multiple Vitamin (MULTIVITAMIN WITH MINERALS) TABS tablet Take 1 tablet by mouth daily. 01/05/21   Thurnell Lose, MD  Polyethyl Glycol-Propyl Glycol (SYSTANE) 0.4-0.3 % SOLN Place 2 drops into both eyes daily.    [provider]  polyethylene glycol (MIRALAX / GLYCOLAX) 17 g packet Take 17 g by mouth every other day. 07/01/21   Royal Hawthorn, NP  potassium chloride (KLOR-CON M) 10 MEQ tablet Take 1 tablet (10 mEq total) by mouth daily. 04/30/21   Royal Hawthorn, NP  senna-docusate (SENOKOT-S) 8.6-50 MG tablet Take 1 tablet by mouth at bedtime as needed.    [provider]  torsemide (DEMADEX) 20 MG tablet Take 20 mg by mouth daily.    [provider]    Physical Exam: Vitals:   08/10/21 1030 08/10/21 1045 08/10/21 1100 08/10/21 1150  BP: 100/75 98/71 100/74 104/76  Pulse: 91 89 88 88  Resp: (!) 22 (!) '22 19 20  '$ Temp:      TempSrc:      SpO2: 99% 99% 100% 98%  Weight:   65.8 kg   Height:   '5\' 7"'$  (1.702 m)    Exam  Constitutional: Elderly male who appears chronically ill Eyes: Eyes currently closed.    Conjunctiva and sclera within normal limits ENMT: Mucous membranes are dry. Neck: normal and supple Respiratory: Normal respiratory effort without significant wheezes or rhonchi appreciated at  this time.  O2 saturation currently maintained on 2 L nasal cannula oxygen. Cardiovascular: Regular rate and rhythm, no murmurs / rubs / gallops. No extremity edema. 2+ pedal pulses. Abdomen: no tenderness, no masses palpated.  Bowel sounds positive.  Musculoskeletal: no clubbing / cyanosis.   Skin: no rashes, lesions, ulcers. No induration Neurologic: CN 2-12 grossly intact.  Left upper extremity weakness.  Patient appears able to move all remedies. Psychiatric: Lethargic, patient not responding to verbal commands at this time to assess.  Data Reviewed:   EKG revealed junctional tachycardia at 97 bpm with left posterior fascicular block Assessment and Plan: Breakthrough seizure acute metabolic encephalopathy Patient presents after having seizure-like activity this morning with reports of shaking of his upper extremities. His wife notes that patient has not had a seizure in over a year since having a stroke.  He has been on Vimpat 50 mg twice daily for treatment.  CT scan of the brain did not note any acute abnormalities.  Patient had been given 5 mg of Versed in route with EMS.  Been loaded with Keppra 1 g IV.  Question possibility of infection lungs threshold.  Wife feels that he is still back to baseline and lethargic. -Admit to a progressive bed for monitoring for seizure activity -Seizure precautions -Neurochecks -Check EEG -Increase Vimpat to 100 mg IV twice daily.  Transition to p.o. once able -Neurology consulted, will follow-up for further recommendation  SIRS/sepsis secondary to possible aspiration pneumonia Patient was noted to be initially tachycardic, tachypneic, and  elevated white blood cell count of 16.1.  Lactic acid was elevated at 5.3.  Chest x-ray noted bilateral infiltrates  with mild clearing from prior study.  Urinalysis noted few bacteria with 11-20 WBCs.  Blood cultures have been obtained and patient was started on vancomycin and cefepime.  This time is not totally clear if the antibiotics are related to seizure however patient had underlying infection.  Urinalysis was not clearly concerning for.  Chest x-ray noted infiltrates given patient's history of dysphagia could be related to aspiration. -Follow-up blood and urine cultures -Check procalcitonin -Continue empiric antibiotics of vancomycin and cefepime -De-escalate when medically appropriate home  Acute kidney injury Patient presented with creatinine 1.18 with BUN 22.  Baseline creatinine previously noted to be  0.7 last month.  This is greater than 0.3 increased from baseline. -Check CK -Normal saline IVF at 75 ml/hr -Recheck bmp in a.m.  Dysphagia -N.p.o. until able to follow commands and advance diet to dysphagia diet 3 when patient able to follow commands -Aspiration precautions with elevation of head of bed  Paroxysmal atrial fibrillation/flutter on chronic anticoagulation Patient appears to be in sinus rhythm at this time. -heparin per pharmacy  -NGT if patient does not awaken to continue Tikosyn  Heart failure with reduced EF Stable.  Patient does not appear to be overloaded at this time.  Echocardiogram from 03/31/2021 noted EF to be 45-50% with indeterminate diastolic function.  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  No reports of bleeding. -Continue to monitor  History of subdural hematoma History of CVA History of endocarditis Patient had history of subdural hematoma in May 2022.  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  Thereafter patient had left cerebellar vermis stroke thought  to be secondary to septic emboli in the setting of mitral valve endocarditis.  Patient is followed by infectious disease and on suppressive amoxicillin -Continue amoxicillin  Hyperlipidemia -Continue atorvastatin  Diabetes mellitus type 2, well controlled Patient's last hemoglobin A1c was 5.7 on 07/05/2021.  Patient appears not to be on any occasion for treatment at this time.   Advance Care Planning:   Code Status: DNR   Consults: neurology  Family Communication: Updated at bedside  Severity of Illness: The appropriate patient status for this patient is OBSERVATION. Observation status is judged to be reasonable and necessary in order to provide the required intensity of service to ensure the patient's safety. The patient's presenting symptoms, physical exam findings, and initial radiographic and laboratory data in the context of their medical condition is felt to place them at decreased risk for further clinical deterioration. Furthermore, it is anticipated that the patient will be medically stable for discharge from the hospital within 2 midnights of admission.   Author: Norval Morton, MD 08/10/2021 12:28 PM  For on call review [email or web address].

## 2021-08-10 NOTE — ED Provider Notes (Signed)
[facility] EMERGENCY DEPARTMENT Provider Note   CSN: [id number] Arrival date & time: 08/10/21  2951     History  Chief Complaint  Patient presents with   Seizures    [name] [name] is a 81 y.o. male.  HPI 81 year old male from skilled nursing facility, history of CHF, atrial fibrillation, chronic anticoagulation, history of subdural hematoma, presents today with right arm shaking and report of seizure activity for almost 2 hours now.  Per EMS report patient was hypotensive and received some IV fluids.  He received 5 mg of Versed IM.  Patient is DNR.  EMS reports that wife and son were at skilled nursing facility are in route to hospital    Home Medications Prior to Admission medications   Medication Sig Start Date End Date Taking? Authorizing Provider  amantadine (SYMMETREL) 100 MG capsule Take 100 mg by mouth 2 (two) times daily. Between 8-11 am and 12-3 pm    [provider]  amLODipine (NORVASC) 5 MG tablet Take 5 mg by mouth daily.    [provider]  amoxicillin (AMOXIL) 400 MG/5ML suspension Take 500 mg by mouth 2 (two) times daily.    [provider]  apixaban (ELIQUIS) 5 MG TABS tablet Take 1 tablet (5 mg total) by mouth 2 (two) times daily. 01/04/21   Thurnell Lose, MD  Armodafinil 50 MG tablet Take 2 tablets (100 mg total) by mouth daily. 06/09/21   Dohmeier, Asencion Partridge, MD  Artificial Saliva (BIOTENE MOISTURIZING MOUTH MT) Use as directed 2 sprays in the mouth or throat in the morning, at noon, and at bedtime.    [provider]  atorvastatin (LIPITOR) 80 MG tablet Take 1 tablet (80 mg total) by mouth daily. 01/04/21   Thurnell Lose, MD  bisacodyl (DULCOLAX) 10 MG suppository Place 1 suppository (10 mg total) rectally daily as needed for moderate constipation or mild constipation. 01/27/21   Hongalgi, Lenis Dickinson, MD  clobetasol (TEMOVATE) 0.05 % external solution SMARTSIG:Topical 1-2 Times Daily PRN 05/10/21   [provider]  dofetilide (TIKOSYN) 125 MCG capsule Take 3 capsules (375 mcg total) by mouth 2 (two) times daily. 01/04/21   Thurnell Lose, MD  famotidine (PEPCID) 20 MG tablet Take 20 mg by mouth at bedtime as needed for indigestion.    [provider]  finasteride (PROSCAR) 5 MG tablet Take 1 tablet (5 mg total) by mouth daily. 01/04/21   Thurnell Lose, MD  Glucerna (GLUCERNA) LIQD Take 237 mLs by mouth daily.    [provider]  hydrocortisone 2.5 % cream SMARTSIG:sparingly Topical Twice Daily 05/10/21   [provider]  lacosamide (VIMPAT) 50 MG TABS tablet Take 1 tablet (50 mg total) by mouth 2 (two) times daily. 05/31/21   Royal Hawthorn, NP  levalbuterol Penne Lash) 0.63 MG/3ML nebulizer solution Take 0.63 mg by nebulization every 6 (six) hours as needed for wheezing or shortness of breath.    [provider]  magnesium gluconate (MAGONATE) 500 MG tablet Take 1 tablet (500 mg total) by mouth daily. 01/04/21   Thurnell Lose, MD  melatonin 5 MG TABS Take 5 mg by mouth at bedtime.    [provider]  methenamine (HIPREX) 1 g tablet Take 1 g by mouth every 12 (twelve) hours.    [provider]  Multiple Vitamin (MULTIVITAMIN WITH MINERALS) TABS tablet Take 1 tablet by mouth daily. 01/05/21   Thurnell Lose, MD  Polyethyl Glycol-Propyl Glycol (SYSTANE) 0.4-0.3 %  SOLN Place 2 drops into both eyes daily.    [provider]  polyethylene glycol (MIRALAX / GLYCOLAX) 17 g packet Take 17 g by mouth every other day. 07/01/21   Royal Hawthorn, NP  potassium chloride (KLOR-CON M) 10 MEQ tablet Take 1 tablet (10 mEq total) by mouth daily. 04/30/21   Royal Hawthorn, NP  senna-docusate (SENOKOT-S) 8.6-50 MG tablet Take 1 tablet by mouth at bedtime as needed.    [provider]  torsemide (DEMADEX) 20 MG tablet Take 20 mg by mouth daily.    [provider]      Allergies    Naproxen and No healthtouch food allergies     Review of Systems   Review of Systems  Physical Exam Updated Vital Signs BP 104/76   Pulse 88   Temp 97.7 F (36.5 C) (Rectal)   Resp 20   Ht 1.702 m ('5\' 7"'$ )   Wt 65.8 kg   SpO2 98%   BMI 22.71 kg/m  Physical Exam Vitals and nursing note reviewed.  Constitutional:      General: He is not in acute distress.    Appearance: He is ill-appearing.  HENT:     Head: Normocephalic and atraumatic.     Right Ear: External ear normal.     Left Ear: External ear normal.     Nose: Nose normal.     Mouth/Throat:     Mouth: Mucous membranes are dry.     Pharynx: Oropharynx is clear.  Eyes:     Pupils: Pupils are equal, round, and reactive to light.  Cardiovascular:     Rate and Rhythm: Tachycardia present. Rhythm irregular.  Pulmonary:     Effort: No respiratory distress.  Abdominal:     General: Bowel sounds are normal.     Palpations: Abdomen is soft.  Musculoskeletal:     Cervical back: Normal range of motion.     Comments: Right elbow flex Left side flaccid No signs of trauma no skin breakdown noted  Skin:    General: Skin is warm and dry.     Capillary Refill: Capillary refill takes less than 2 seconds.     Findings: No erythema or rash.  Neurological:     Comments: Patient squeezes right hand on command    ED Results / Procedures / Treatments   Labs (all labs ordered are listed, but only abnormal results are displayed) Labs Reviewed  CBC WITH DIFFERENTIAL/PLATELET - Abnormal; Notable for the following components:      Result Value   WBC 16.1 (*)    RBC 3.28 (*)    Hemoglobin 10.0 (*)    HCT 31.6 (*)    Neutro Abs 14.3 (*)    Abs Immature Granulocytes 0.08 (*)    All other components within normal limits  COMPREHENSIVE METABOLIC PANEL - Abnormal; Notable for the following components:   CO2 16 (*)    Glucose, Bld 148 (*)    Calcium 8.8 (*)    Total Protein 6.4 (*)    Albumin 3.3 (*)    All other components within normal limits  URINALYSIS, ROUTINE W  REFLEX MICROSCOPIC - Abnormal; Notable for the following components:   APPearance HAZY (*)    Protein, ur 30 (*)    Bacteria, UA FEW (*)    All other components within normal limits  PROTIME-INR - Abnormal; Notable for the following components:   Prothrombin Time 15.9 (*)    INR 1.3 (*)    All other  components within normal limits  LACTIC ACID, PLASMA - Abnormal; Notable for the following components:   Lactic Acid, Venous 5.3 (*)    All other components within normal limits  I-STAT ARTERIAL BLOOD GAS, ED - Abnormal; Notable for the following components:   pH, Arterial 7.277 (*)    pO2, Arterial 77 (*)    Bicarbonate 18.8 (*)    TCO2 20 (*)    Acid-base deficit 8.0 (*)    HCT 29.0 (*)    Hemoglobin 9.9 (*)    All other components within normal limits  CULTURE, BLOOD (SINGLE)  CULTURE, BLOOD (SINGLE)  LACTIC ACID, PLASMA    EKG EKG Interpretation  Date/Time:  Tuesday August 10 2021 09:27:02 EDT Ventricular Rate:  107 PR Interval:    QRS Duration: 113 QT Interval:  317 QTC Calculation: 423 R Axis:   99 Text Interpretation: Junctional tachycardia Left posterior fascicular block Anteroseptal infarct, age indeterminate Confirmed by Pattricia Boss [id number]) on 08/10/2021 10:21:51 AM  Radiology CT Head Wo Contrast  Result Date: 08/10/2021 CLINICAL DATA:  Mental status changes. EXAM: CT HEAD WITHOUT CONTRAST TECHNIQUE: Contiguous axial images were obtained from the base of the skull through the vertex without intravenous contrast. RADIATION DOSE REDUCTION: This exam was performed according to the departmental dose-optimization program which includes automated exposure control, adjustment of the mA and/or kV according to patient size and/or use of iterative reconstruction technique. COMPARISON:  01/23/2021.  12/14/2020. FINDINGS: Brain: There is no evidence for acute hemorrhage, hydrocephalus, mass lesion, or abnormal extra-axial fluid collection. No definite CT evidence for acute infarction.  Diffuse loss of parenchymal volume is consistent with atrophy. Patchy low attenuation in the deep hemispheric and periventricular white matter is nonspecific, but likely reflects chronic microvascular ischemic demyelination. Global ventriculomegaly likely a combination of atrophy and previous infarcts. Stable appearance right frontotemporal parietal encephalomalacia consistent with MCA territory infarct. Encephalomalacia in the left parietal lobe is stable. Vascular: No hyperdense vessel or unexpected calcification. Skull: Right craniotomy defect again noted. Sinuses/Orbits: The visualized paranasal sinuses and mastoid air cells are clear. Visualized portions of the globes and intraorbital fat are unremarkable. Other: None. IMPRESSION: 1. No acute intracranial abnormality. 2. 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 3. Atrophy with chronic small vessel white matter ischemic disease. Electronically Signed   By: Misty Stanley M.D.   On: 08/10/2021 11:44   DG Chest Port 1 View  Result Date: 08/10/2021 CLINICAL DATA:  Altered mental status. EXAM: PORTABLE CHEST 1 VIEW COMPARISON:  January 23, 2021. FINDINGS: Patchy bilateral airspace opacities are mildly improved. Visible pleural effusions or pneumothorax. Similar cardiomediastinal silhouette. Cardiac valve replacements. Median sternotomy. No evidence of acute osseous abnormality. IMPRESSION: Patchy bilateral airspace opacities are mildly improved. Electronically Signed   By: Margaretha Sheffield M.D.   On: 08/10/2021 10:16    Procedures Procedures    Medications Ordered in ED Medications  vancomycin (VANCOREADY) IVPB 1500 mg/300 mL (has no administration in time range)  lactated ringers bolus 1,000 mL (0 mLs Intravenous Stopped 08/10/21 1036)  levETIRAcetam (KEPPRA) IVPB 1000 mg/100 mL premix (0 mg Intravenous Stopped 08/10/21 1156)  ceFEPIme (MAXIPIME) 2 g in sodium chloride 0.9 % 100 mL IVPB (2 g Intravenous New Bag/Given 08/10/21 1155)    ED  Course/ Medical Decision Making/ A&P Clinical Course as of 08/10/21 1227  Tue Aug 10, 2021  1003 X-Christianjames Soule reviewed interpreted with bilateral patchy infiltrates edema versus infection [DR]  1003 ABG reviewed and interpreted patient on nasal cannula at the time  that it was drawn patient is acidotic with pH 7.27 PCO2 of 40 and PO2 of 77 with bicarb of 18 [DR]  1007 CBC reviewed interpreted and leukocytosis with white blood cell count of 16,100 noted Mild anemia with hemoglobin of 10 [DR]  1007 First prior hemoglobin 4 weeks ago was also 10 Leukocytosis is new [DR]  1141 CT Head Wo Contrast [DR]    Clinical Course User Index [DR] Pattricia Boss, MD                           Medical Decision Making 81 year old man history of stroke, subdural hematoma, on chronic anticoagulation, history of endocarditis, at baseline is nonambulatory but does speak in short phrases and identify family.  Today had seizure activity that sounds like it was grand mal in nature.  He is less responsive than usual.  Patient received prehospital Ativan had episode of hypotension.  Patient has had improved blood pressure here in ED.  I have discussed patient's CODE STATUS with son and wife.  He is DNR and they do not wish intubation.  Additionally, we discussed IV antibiotics and aggressiveness of fluid and pressors.  They do not wish to put the patient into volume overload and do not wish IV pressors. Patient is hypothermic.  He has known history of infections and has history of endocarditis.  Patient has leukocytosis.  Chest x-Jahniah Pallas with increased markings that could represent infection. Due to hypothermia, leukocytosis, possible infectious etiologies including endocarditis and lung, urine pending, broad-spectrum antibiotics initiated First lactic acid has now returned and is 5.  This could represent lactic acidosis from infection or could represent seizure activity. Patient has not had ongoing seizure activity here in ED.  He does  follow some instructions but does not appear back to baseline. 1- seizure like activity- head ct unchanged, patient on vimpat- son states attempting to use least sedating seizure med.  Loaded with keppra here.  No repeat seizure activity noted.  Patient follows directions 2- ?sepsis- patient with elevated wbc, hypothermia, cxr-?infiltrates, ho endocarditis- broad spectrum abx intiated, hypotensive pre hospita.  One liter ns infused, elevated lactic acid 3- prior stroke and sdh- patient with residual left sided weakness, no new symptoms, head ct stable 4- ho afib rvr on chronic anticoagulation- junctional tachycardia on ekg, inr 1.3 5- anemia- hgb 10 stable from first prior   Amount and/or Complexity of Data Reviewed Independent Historian: spouse and EMS    Details: Son External Data Reviewed: labs and notes.    Details: Reviewed snf notes and prior labs Labs: ordered. Decision-making details documented in ED Course. Radiology: ordered and independent interpretation performed. Decision-making details documented in ED Course. ECG/medicine tests: ordered and independent interpretation performed. Decision-making details documented in ED Course. Discussion of management or test interpretation with external provider(s): Care discussed with Dr. [name], on-call for hospitalist that he will see for admission  Risk Prescription drug management. Parenteral controlled substances. Decision regarding hospitalization. Decision not to resuscitate or to de-escalate care because of poor prognosis.  Critical Care Total time providing critical care: 45 minutes          Final Clinical Impression(s) / ED Diagnoses Final diagnoses:  Seizure-like activity (HCC)  Leukocytosis, unspecified type  Elevated lactic acid level    Rx / DC Orders ED Discharge Orders     None         Pattricia Boss, MD 08/10/21 1227

## 2021-08-10 NOTE — Telephone Encounter (Signed)
Agree.  With back-to-back seizures, proceeding to ED for emergent evaluation as indicated.  Thank you.

## 2021-08-11 DIAGNOSIS — Z7401 Bed confinement status: Secondary | ICD-10-CM | POA: Diagnosis not present

## 2021-08-11 DIAGNOSIS — I69354 Hemiplegia and hemiparesis following cerebral infarction affecting left non-dominant side: Secondary | ICD-10-CM | POA: Diagnosis not present

## 2021-08-11 DIAGNOSIS — G9341 Metabolic encephalopathy: Secondary | ICD-10-CM | POA: Diagnosis present

## 2021-08-11 DIAGNOSIS — J189 Pneumonia, unspecified organism: Secondary | ICD-10-CM | POA: Diagnosis present

## 2021-08-11 DIAGNOSIS — D649 Anemia, unspecified: Secondary | ICD-10-CM | POA: Diagnosis present

## 2021-08-11 DIAGNOSIS — Z79899 Other long term (current) drug therapy: Secondary | ICD-10-CM | POA: Diagnosis not present

## 2021-08-11 DIAGNOSIS — Z91013 Allergy to seafood: Secondary | ICD-10-CM | POA: Diagnosis not present

## 2021-08-11 DIAGNOSIS — R4182 Altered mental status, unspecified: Secondary | ICD-10-CM | POA: Diagnosis not present

## 2021-08-11 DIAGNOSIS — Z7901 Long term (current) use of anticoagulants: Secondary | ICD-10-CM | POA: Diagnosis not present

## 2021-08-11 DIAGNOSIS — R1312 Dysphagia, oropharyngeal phase: Secondary | ICD-10-CM | POA: Diagnosis present

## 2021-08-11 DIAGNOSIS — Z96641 Presence of right artificial hip joint: Secondary | ICD-10-CM | POA: Diagnosis present

## 2021-08-11 DIAGNOSIS — Z886 Allergy status to analgesic agent status: Secondary | ICD-10-CM | POA: Diagnosis not present

## 2021-08-11 DIAGNOSIS — Z833 Family history of diabetes mellitus: Secondary | ICD-10-CM | POA: Diagnosis not present

## 2021-08-11 DIAGNOSIS — A419 Sepsis, unspecified organism: Secondary | ICD-10-CM | POA: Diagnosis not present

## 2021-08-11 DIAGNOSIS — I5043 Acute on chronic combined systolic (congestive) and diastolic (congestive) heart failure: Secondary | ICD-10-CM | POA: Diagnosis present

## 2021-08-11 DIAGNOSIS — N179 Acute kidney failure, unspecified: Secondary | ICD-10-CM | POA: Diagnosis present

## 2021-08-11 DIAGNOSIS — I445 Left posterior fascicular block: Secondary | ICD-10-CM | POA: Diagnosis present

## 2021-08-11 DIAGNOSIS — I110 Hypertensive heart disease with heart failure: Secondary | ICD-10-CM | POA: Diagnosis present

## 2021-08-11 DIAGNOSIS — Z66 Do not resuscitate: Secondary | ICD-10-CM | POA: Diagnosis present

## 2021-08-11 DIAGNOSIS — I4819 Other persistent atrial fibrillation: Secondary | ICD-10-CM | POA: Diagnosis present

## 2021-08-11 DIAGNOSIS — R569 Unspecified convulsions: Secondary | ICD-10-CM | POA: Diagnosis present

## 2021-08-11 DIAGNOSIS — G40909 Epilepsy, unspecified, not intractable, without status epilepticus: Secondary | ICD-10-CM | POA: Diagnosis present

## 2021-08-11 DIAGNOSIS — Z8249 Family history of ischemic heart disease and other diseases of the circulatory system: Secondary | ICD-10-CM | POA: Diagnosis not present

## 2021-08-11 DIAGNOSIS — R6510 Systemic inflammatory response syndrome (SIRS) of non-infectious origin without acute organ dysfunction: Secondary | ICD-10-CM | POA: Diagnosis present

## 2021-08-11 DIAGNOSIS — E119 Type 2 diabetes mellitus without complications: Secondary | ICD-10-CM | POA: Diagnosis present

## 2021-08-11 DIAGNOSIS — E785 Hyperlipidemia, unspecified: Secondary | ICD-10-CM | POA: Diagnosis present

## 2021-08-11 DIAGNOSIS — Z953 Presence of xenogenic heart valve: Secondary | ICD-10-CM | POA: Diagnosis not present

## 2021-08-11 LAB — COMPREHENSIVE METABOLIC PANEL
ALT: 23 U/L (ref 0–44)
AST: 27 U/L (ref 15–41)
Albumin: 3.1 g/dL — ABNORMAL LOW (ref 3.5–5.0)
Alkaline Phosphatase: 80 U/L (ref 38–126)
Anion gap: 7 (ref 5–15)
BUN: 17 mg/dL (ref 8–23)
CO2: 24 mmol/L (ref 22–32)
Calcium: 8.6 mg/dL — ABNORMAL LOW (ref 8.9–10.3)
Chloride: 109 mmol/L (ref 98–111)
Creatinine, Ser: 0.81 mg/dL (ref 0.61–1.24)
GFR, Estimated: >60 mL/min (ref >60)
Glucose, Bld: 120 mg/dL — ABNORMAL HIGH (ref 70–99)
Potassium: 3.6 mmol/L (ref 3.5–5.1)
Sodium: 140 mmol/L (ref 135–145)
Total Bilirubin: 0.8 mg/dL (ref 0.3–1.2)
Total Protein: 5.8 g/dL — ABNORMAL LOW (ref 6.5–8.1)

## 2021-08-11 LAB — URINE CULTURE: Culture: NO GROWTH

## 2021-08-11 LAB — CBC
HCT: 28.7 % — ABNORMAL LOW (ref 39.0–52.0)
Hemoglobin: 9.4 g/dL — ABNORMAL LOW (ref 13.0–17.0)
MCH: 30.1 pg (ref 26.0–34.0)
MCHC: 32.8 g/dL (ref 30.0–36.0)
MCV: 92 fL (ref 80.0–100.0)
Platelets: 218 10*3/uL (ref 150–400)
RBC: 3.12 MIL/uL — ABNORMAL LOW (ref 4.22–5.81)
RDW: 15.4 % (ref 11.5–15.5)
WBC: 9.1 10*3/uL (ref 4.0–10.5)
nRBC: 0 % (ref 0.0–0.2)

## 2021-08-11 LAB — CK: Total CK: 44 U/L — ABNORMAL LOW (ref 49–397)

## 2021-08-11 LAB — HEPARIN LEVEL (UNFRACTIONATED): Heparin Unfractionated: >1.1 IU/mL — ABNORMAL HIGH (ref 0.30–0.70)

## 2021-08-11 LAB — PROCALCITONIN: Procalcitonin: <0.1 ng/mL

## 2021-08-11 LAB — MAGNESIUM: Magnesium: 1.9 mg/dL (ref 1.7–2.4)

## 2021-08-11 LAB — APTT: aPTT: 62 seconds — ABNORMAL HIGH (ref 24–36)

## 2021-08-11 MED ORDER — POTASSIUM CHLORIDE 20 MEQ PO PACK
40.0000 meq | PACK | Freq: Once | ORAL | Status: AC
  Administered 2021-08-11: 40 meq via ORAL
  Filled 2021-08-11: qty 2

## 2021-08-11 MED ORDER — SODIUM CHLORIDE 0.9 % IV SOLN
3.0000 g | Freq: Four times a day (QID) | INTRAVENOUS | Status: DC
  Administered 2021-08-11 – 2021-08-12 (×5): 3 g via INTRAVENOUS
  Filled 2021-08-11 (×8): qty 8

## 2021-08-11 MED ORDER — ORAL CARE MOUTH RINSE
15.0000 mL | Freq: Two times a day (BID) | OROMUCOSAL | Status: DC
  Administered 2021-08-11 – 2021-08-12 (×4): 15 mL via OROMUCOSAL

## 2021-08-11 MED ORDER — LACOSAMIDE 50 MG PO TABS
75.0000 mg | ORAL_TABLET | Freq: Two times a day (BID) | ORAL | Status: DC
  Administered 2021-08-11 – 2021-08-12 (×2): 75 mg via ORAL
  Filled 2021-08-11 (×2): qty 2

## 2021-08-11 MED ORDER — APIXABAN 5 MG PO TABS
5.0000 mg | ORAL_TABLET | Freq: Two times a day (BID) | ORAL | Status: DC
  Administered 2021-08-11 – 2021-08-12 (×3): 5 mg via ORAL
  Filled 2021-08-11 (×3): qty 1

## 2021-08-11 NOTE — Progress Notes (Signed)
[place] for Apixaban (from Heparin) Indication: atrial fibrillation  Allergies  Allergen Reactions   Naproxen Hives   No Healthtouch Food Allergies Other (See Comments)    Scallops - distress, nausea and vomitting    Patient Measurements: Height: '5\' 7"'$  (170.2 cm) Weight: 65.8 kg (145 lb) IBW/kg (Calculated) : 66.1  Vital Signs: Temp: 97.9 F (36.6 C) (06/07 0446) Temp Source: Oral (06/07 0446) BP: 101/63 (06/07 0446) Pulse Rate: 71 (06/07 0446)  Labs: Recent Labs    08/10/21 0937 08/10/21 0957 08/11/21 0408  HGB 10.0* 9.9* 9.4*  HCT 31.6* 29.0* 28.7*  PLT 238  --  218  APTT  --   --  62*  LABPROT 15.9*  --   --   INR 1.3*  --   --   HEPARINUNFRC  --   --  >1.10*  CREATININE 1.18  --  0.81  CKTOTAL 44*  --   --     Estimated Creatinine Clearance: 70 mL/min (by C-G formula based on SCr of 0.81 mg/dL).   Assessment: 66 YOM with PMH significant for atrial fibrillation on apixaban PTA who presented with encephalopathy with no PO access. Pharmacy consulted to manage heparin while NPO. Patient's last dose of apixban PTA was 6/05 @ 1900. Patient now able to take medications PO. Pharmacy consulted to transition back to apixaban.   Goal of Therapy:  Heparin level 0.3-0.7 units/ml aPTT 66-102 seconds Monitor platelets by anticoagulation protocol: Yes   Plan:  Stop heparin infusion at 1400 Resume PTA apixaban '5mg'$  twice daily at 1400 Continue to monitor H&H and platelets    Thank you for allowing pharmacy to be a part of this patient's care.  Ardyth Harps, PharmD Clinical Pharmacist

## 2021-08-11 NOTE — Evaluation (Signed)
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   External hemorrhoids without mention of complication    H/O aortic valve replacement    H/O mitral valve replacement    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   Lesion of ulnar nerve    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    Other and unspecified hyperlipidemia    Pre-diabetes    Previous back surgery 1978, jan 2007   Psychosexual dysfunction with inhibited sexual excitement    SOB (shortness of breath)    "with heavy exercise"   Stroke ([place]) 08/2013   "I WAS IN AFIB AND THREW A CLOT, THE EFFECTS WERE TRANSITORY AND DIDNT LAST BUT FOR 30 MINUTES"    Thoracic aortic aneurysm [facility])    Past Surgical History:  Past Surgical History:  Procedure Laterality Date   AORTIC AND MITRAL VALVE REPLACEMENT     09/2004   CARDIOVERSION     3 times from 2004-2006   CARDIOVERSION N/A 09/26/2013   Procedure: CARDIOVERSION;  Surgeon: Larey Dresser, MD;  Location: [place];  Service: Cardiovascular;  Laterality: N/A;   CARDIOVERSION N/A 06/19/2014   Procedure: CARDIOVERSION;  Surgeon: Jerline Pain, MD;  Location: [place];  Service: Cardiovascular;  Laterality: N/A;   CARDIOVERSION N/A 04/24/2017   Procedure: CARDIOVERSION;  Surgeon: Larey Dresser, MD;  Location: [place];  Service: Cardiovascular;  Laterality: N/A;   CARDIOVERSION N/A 06/08/2017   Procedure: CARDIOVERSION;  Surgeon: Pixie Casino,  MD;  Location: [facility] ENDOSCOPY;  Service: Cardiovascular;  Laterality: N/A;   CARDIOVERSION N/A 08/24/2017   Procedure: CARDIOVERSION;  Surgeon: Skeet Latch, MD;  Location: [facility] ENDOSCOPY;  Service: Cardiovascular;  Laterality: N/A;   CARDIOVERSION N/A 02/14/2018   Procedure: CARDIOVERSION;  Surgeon: Larey Dresser, MD;  Location: [facility] ENDOSCOPY;  Service: Cardiovascular;  Laterality: N/A;   COLONOSCOPY     CRANIOTOMY N/A 07/28/2020   Procedure: CRANIOTOMY FOR EVACUATION OF SUBDURAL HEMATOMA;  Surgeon: Eustace Barot, MD;  Location: [place];  Service: Neurosurgery;  Laterality: N/A;   IR ANGIO VERTEBRAL SEL SUBCLAVIAN INNOMINATE UNI R MOD SED  08/07/2020   IR CT HEAD LTD  08/07/2020   IR PERCUTANEOUS ART THROMBECTOMY/INFUSION INTRACRANIAL INC DIAG ANGIO  08/07/2020   laminectomies     10/1975 and in 03/2005   RADIOLOGY WITH ANESTHESIA N/A 08/07/2020   Procedure: IR WITH ANESTHESIA;  Surgeon: Luanne Bras, MD;  Location: [place];  Service: Radiology;  Laterality: N/A;   TEE WITHOUT CARDIOVERSION N/A 12/18/2020   Procedure: TRANSESOPHAGEAL ECHOCARDIOGRAM (TEE);  Surgeon: Larey Dresser, MD;  Location: [facility] ENDOSCOPY;  Service: Cardiovascular;  Laterality: N/A;   [place] Right 04/03/2018   Procedure: TOTAL HIP ARTHROPLASTY ANTERIOR APPROACH;  Surgeon: Paralee Cancel, MD;  Location: [facility];  Service: Orthopedics;  Laterality: Right;  70 mins   HPI:  Patient is a 81 y.o. 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  MBS with SLP 01/01/2021 showed oropharyngeal dysphagia with inconsitent penetration with thin liquids but no aspiration and recommended Dys 3 solids, thin liquids.    Assessment / Plan / Recommendation  Clinical Impression  Patient presenting with clinical s/s of dysphagia as per this bedside swallow evaluation,  however SLP suspects his swallow function is at or near baseline. He has h/o dysphagia s/p CVA in 2022 with MBS dated 01/01/2021 showing oropharyngeal dysphagia with inconsistent penetration of thin liquids but no aspiration, mastication and oral transit delays of solids and recommended Dys 3 solids, thin liquids at that time. SLP observed patient with lunch meal of Dys 3 solids, thin liquids and with his wife feeding him. Decreased mastication efficiency, left sided pocketing/residuals of solid PO's observed but with full clearance s/p swallows and liquid sips. Suspected delayed swallow initiation but no overt s/s aspiration or penetration. SLP recommending to continue with current diet and will plan to f/u at least one more time to ensure diet toleration. SLP Visit Diagnosis: Dysphagia, unspecified (R13.10)    Aspiration Risk  Mild aspiration risk    Diet Recommendation Dysphagia 3 (Mech soft);Thin liquid   Liquid Administration via: Straw;Cup Medication Administration: Whole meds with liquid Supervision: Full supervision/cueing for compensatory strategies;Staff to assist with self feeding Compensations: Minimize environmental distractions;Slow rate;Small sips/bites Postural Changes: Seated upright at 90 degrees    Other  Recommendations Oral Care Recommendations: Oral care BID;Oral care before and after PO;Staff/trained caregiver to provide oral care    Recommendations for follow up therapy are one component of a multi-disciplinary discharge planning process, led by the attending physician.  Recommendations may be updated based on patient status, additional functional criteria and insurance authorization.  Follow up Recommendations Follow physician's recommendations for discharge plan and follow up therapies      Assistance Recommended at Discharge Frequent or constant Supervision/Assistance  Functional Status Assessment Patient has had a recent decline in their functional status and  demonstrates the ability to make significant improvements in function in a reasonable and predictable amount of time.  Frequency and Duration min 1 x/week  1 week       Prognosis Prognosis for Safe Diet Advancement: Fair Barriers to Reach Goals: Time post onset Barriers/Prognosis Comment: patient likely on least restrictive diet of Dys 3 solids, thin liquids      Swallow Study   General Date of Onset: 08/11/21 HPI: Patient is a 81 y.o. 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  MBS with SLP 01/01/2021 showed oropharyngeal dysphagia with inconsitent penetration with thin liquids but no aspiration and recommended Dys 3 solids, thin liquids. Type of Study: Bedside Swallow Evaluation Previous Swallow Assessment: 01/01/2021 MBS Diet Prior to this Study: Dysphagia 3 (soft);Thin liquids Temperature Spikes Noted: No Respiratory Status: Room air History of Recent Intubation: No Behavior/Cognition: Alert;Cooperative;Requires cueing Oral Cavity Assessment: Within Functional Limits Oral Care Completed by SLP: No Oral Cavity - Dentition: Adequate natural dentition Self-Feeding Abilities: Total assist Patient Positioning: Upright in bed Baseline Vocal Quality: Low vocal intensity Volitional Cough: Cognitively unable to elicit Volitional Swallow: Unable to elicit    Oral/Motor/Sensory Function Overall Oral Motor/Sensory Function: Mild impairment Facial ROM: Reduced left Facial Strength: Reduced left Lingual Strength:  Reduced   Ice Chips     Thin Liquid Thin Liquid: Impaired Presentation: Straw Oral Phase Impairments: Poor awareness of bolus Pharyngeal  Phase Impairments: Suspected delayed Swallow    Nectar Thick     Honey Thick     Puree Puree: Not tested   Solid     Solid: Impaired Oral Phase Impairments: Impaired mastication Oral Phase  Functional Implications: Prolonged oral transit;Impaired mastication Pharyngeal Phase Impairments: Suspected delayed Swallow      Sonia Baller, MA, CCC-SLP Speech Therapy

## 2021-08-11 NOTE — Plan of Care (Signed)
  Problem: Education: Goal: Knowledge of General Education information will improve Description Including pain rating scale, medication(s)/side effects and non-pharmacologic comfort measures Outcome: Progressing   

## 2021-08-11 NOTE — Progress Notes (Signed)
[place] for heparin Indication: atrial fibrillation  Allergies  Allergen Reactions   Naproxen Hives   No [facility] Food Allergies Other (See Comments)    Scallops - distress, nausea and vomitting    Patient Measurements: Height: '5\' 7"'$  (170.2 cm) Weight: 65.8 kg (145 lb) IBW/kg (Calculated) : 66.1 Heparin Dosing Weight: TBW  Vital Signs: Temp: 97.9 F (36.6 C) (06/07 0446) Temp Source: Oral (06/07 0446) BP: 101/63 (06/07 0446) Pulse Rate: 71 (06/07 0446)  Labs: Recent Labs    08/10/21 0937 08/10/21 0957 08/11/21 0408  HGB 10.0* 9.9* 9.4*  HCT 31.6* 29.0* 28.7*  PLT 238  --  218  APTT  --   --  62*  LABPROT 15.9*  --   --   INR 1.3*  --   --   HEPARINUNFRC  --   --  >1.10*  CREATININE 1.18  --   --   CKTOTAL 44*  --   --      Estimated Creatinine Clearance: 48 mL/min (by C-G formula based on SCr of 1.18 mg/dL).   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    Other and unspecified hyperlipidemia    Pre-diabetes    Previous back surgery 1978, jan 2007   Psychosexual dysfunction with inhibited sexual excitement    SOB (shortness of breath)    "with heavy exercise"   Stroke ([place]) 08/2013   "I WAS IN AFIB AND THREW A CLOT, THE EFFECTS WERE TRANSITORY AND DIDNT LAST BUT FOR 30 MINUTES"    Thoracic aortic aneurysm ([place])     Assessment: 81 YOM presenting with encephalopathy with no PO access, on Eliquis PTA for afib, last  dose 6/5 '@1900'$   6/7 AM update:  aPTT low  Goal of Therapy:  Heparin level 0.3-0.7 units/ml aPTT 66-102 seconds Monitor platelets by anticoagulation protocol: Yes   Plan:  Inc heparin to 1050 units/hr 1400 heparin level and aPTT  Narda Bonds, PharmD, BCPS Clinical Pharmacist Phone: [phone]

## 2021-08-11 NOTE — Assessment & Plan Note (Addendum)
-   Does have some bilateral interstitial opacities appreciated on CXR.  Not unreasonable to consider possible aspiration during seizure but may have also had some component of pneumonia developing prior to which could have also precipitated seizure - On vancomycin and cefepime on admission.  Given altered cognition, will discontinue cefepime at this time in efforts to avoid further contribution.  Start on Unasyn to cover for aspiration. Transitioned to augmentin to complete course.  After course, patient will be resumed back on his chronic amoxicillin - needs repeat CXR in 4-5 weeks - encourage incentive spirometer use

## 2021-08-11 NOTE — Evaluation (Signed)
Physical Therapy Evaluation Patient Details Name: [name] [name] MRN: [id number] DOB: 03/27/1940 Today's Date: 08/11/2021  History of Present Illness  [name] [name] is a 81 y.o. male who came from [facility] after being found with seizure like activities. 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 Pt near baseline. Pt was non-ambulatory and dependent for all mobility and ADLs PTA. Pt  resides at [facility]. Pt continues to require maxAx2 for transfer EOB and maintain sitting balance. Pt appropriate to return to [facility] when medically appropriate.       Recommendations for follow up therapy are one component of a multi-disciplinary discharge planning process, led by the attending physician.  Recommendations may be updated based on patient status, additional functional criteria and insurance authorization.  Follow Up Recommendations Skilled nursing-short term rehab (<3 hours/day) (return to [facility])    Assistance Recommended at Discharge Frequent or constant Supervision/Assistance  Patient can return home with the following       Equipment Recommendations None recommended by PT  Recommendations for Other Services       Functional Status Assessment Patient has had a recent decline in their functional status and/or demonstrates limited ability to make significant improvements in function in a reasonable and predictable amount of time     Precautions / Restrictions Precautions Precautions: Fall Restrictions Weight Bearing Restrictions: No      Mobility  Bed Mobility Overal bed mobility: Needs  Assistance Bed Mobility: Supine to Sit, Sit to Sidelying     Supine to sit: Total assist, +2 for physical assistance   Sit to sidelying: Total assist, +2 for physical assistance General bed mobility comments: HOB elevated and helicopter transfer performed to EOB, no active assist from patient, pt dependent to return to supine via sideling technique    Transfers                   General transfer comment: did not attempt    Ambulation/Gait               General Gait Details: unable  Stairs            Wheelchair Mobility    Modified Rankin (Stroke Patients Only) Modified Rankin (Stroke Patients Only) Pre-Morbid Rankin Score: Severe disability Modified Rankin: Severe disability     Balance Overall balance assessment: Needs assistance Sitting-balance support: Feet supported, Single extremity supported Sitting balance-Leahy Scale: Poor Sitting balance - Comments: dependent on posterior support, occasional episode of retropulsion, when given tactile cues at hips flexors pt brings chest forward with tactile cues at posterior trunk, pt will push with R UE towards the L Postural control: Posterior lean, Left lateral lean                                   Pertinent Vitals/Pain Pain Assessment Pain Assessment: Faces Faces Pain Scale: No hurt Pain Location: no grimacing    Home Living Family/patient expects to be discharged to:: Skilled nursing facility (from [facility] [facility])  Additional Comments: wife lives in [place] and spouse is in the skilled section    Prior Function Prior Level of Function : Needs assist             Mobility Comments: hasn't walked since Oct 2022, OT continues to work with pt at [employer], will work on standing but is bed/w/c bound ADLs Comments: dependent, wife feeds pt food     Hand Dominance   Dominant Hand: Right    Extremity/Trunk Assessment   Upper Extremity  Assessment Upper Extremity Assessment: Defer to OT evaluation    Lower Extremity Assessment Lower Extremity Assessment: RLE deficits/detail;LLE deficits/detail RLE Deficits / Details: at EOB when pt asked to kick R LE pt with noted effort, 1/5 LLE Deficits / Details: no active movement, increased tone limiting passive ROM capabilities    Cervical / Trunk Assessment Cervical / Trunk Assessment: Kyphotic  Communication   Communication: Expressive difficulties;Other (comment) (garbled/muffled speech)  Cognition Arousal/Alertness: Awake/alert Behavior During Therapy: Flat affect Overall Cognitive Status: Impaired/Different from baseline Area of Impairment: Following commands, Attention                   Current Attention Level: Focused   Following Commands: Follows one step commands with increased time       General Comments: 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 Pt with R gaze, minimal command follow, no vocalization, no initiation of tasks        General Comments General comments (skin integrity, edema, etc.): VSS    Exercises Other Exercises Other Exercises: assist to rotate trunk L/R, scap retraction, and increased trunk flexion   Assessment/Plan    PT Assessment Patient needs continued PT services  PT Problem List Decreased strength;Decreased activity tolerance;Decreased balance;Decreased mobility;Decreased coordination;Decreased cognition;Decreased knowledge of use of DME;Decreased safety awareness       PT Treatment Interventions DME instruction;Gait training;Stair training;Functional mobility training;Therapeutic activities;Therapeutic exercise;Balance training    PT Goals (Current goals can be found in the Care Plan section)  Acute Rehab PT Goals Patient Stated Goal: didn't state PT Goal Formulation: With family Time For Goal Achievement: 08/25/21 Potential to Achieve Goals: Good    Frequency Min 2X/week     Co-evaluation PT/OT/SLP  Co-Evaluation/Treatment: Yes Reason for Co-Treatment: Necessary to address cognition/behavior during functional activity PT goals addressed during session: Mobility/safety with mobility         AM-PAC PT "6 Clicks" Mobility  Outcome Measure Help needed turning from your back to your side while in a flat bed without using bedrails?: Total Help needed moving from lying on your back to sitting on the side of a flat bed without using bedrails?: Total Help needed moving to and from a bed to a chair (including a wheelchair)?: Total Help needed standing up from a chair using your arms (e.g., wheelchair or bedside chair)?: Total Help needed to walk in hospital room?: Total Help needed climbing 3-5 steps with a railing? : Total 6 Click Score: 6    End of Session   Activity Tolerance: Patient tolerated treatment well Patient left: in bed;with call bell/phone within reach;with bed alarm set;with family/visitor present Nurse Communication: Mobility status PT Visit Diagnosis: Unsteadiness on feet (R26.81);Muscle weakness (generalized) (M62.81);Difficulty in walking, not elsewhere classified (R26.2)    Time: 7412-8786 PT Time Calculation (min) (ACUTE ONLY): 31 min   Charges:   PT Evaluation $PT Eval Moderate Complexity: 1 Mod          Wise Fees, PT, DPT Acute Rehabilitation  Services Secure chat preferred Office #: [phone]   Berline Lopes 08/11/2021, 12:14 PM

## 2021-08-11 NOTE — Progress Notes (Signed)
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  He was witnessed by his wife to have right arm shaking followed by B/L arm shaking.  He has residual left-sided weakness from prior strokes.  Interval History:  Resting in bed; wife present bedside this morning. We discussed his plan of care; her concern was his somnolence.  Discussed that we would adjust medications further and try to avoid any sedating medications as well. I reviewed CXR findings as well and plan for modifying antibiotics.  Assessment and Plan: * Seizure ([place]) - Seizure suspected to be breakthrough seizure.  Unclear etiology.  Some theories include possible underlying infection with reports of patient developing cough recently prior to his breakthrough seizure (infection precipitated).  Also was on lower dose of Vimpat which could have contributed - Neurology following, appreciate assistance.  Vimpat dose has been increased and adjusted  Sepsis due to pneumonia [facility]) - Does have some bilateral interstitial opacities appreciated on CXR.  Not unreasonable to consider possible aspiration during seizure but may have also had some component of pneumonia developing prior to which could have also precipitated seizure - On vancomycin and cefepime on admission.  Given altered cognition, will discontinue cefepime at this time in efforts to avoid further contribution.  Start on Unasyn to cover for aspiration.  After course, patient will be  resumed back on his chronic amoxicillin   Acute kidney injury - baseline creatinine ~ 0.7 - patient presents with increase in creat >0.3 mg/dL above baseline, creat increase >1.5x baseline presumed to have occurred within past 7 days PTA - creat 1.18 on admission; CK 44 on admission - continue IVF - BMP in am   Dysphagia -Continue dysphagia level 3 diet - Continue aspiration precautions   Paroxysmal atrial fibrillation/flutter on chronic anticoagulation -Initially started on heparin drip while n.p.o. - Okay to resume Eliquis now that diet resumed   Heart failure with reduced EF Stable.  Patient does not appear to be overloaded at this time.  Echocardiogram from 03/31/2021 noted EF to be 45-50% with indeterminate diastolic function.   History of rheumatic heart disease s/p remote bioprosthetic AVR/MVR   Normocytic anemia On admission hemoglobin 10 g/dL which appears slightly lower than recent check 5/8 of 10.9 g/dL.  No reports of bleeding. -Continue to monitor   History of subdural hematoma History of CVA History of endocarditis Patient had history of subdural hematoma in May 2022.  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  Thereafter patient had left cerebellar vermis stroke thought to be secondary to septic emboli in the setting of mitral valve endocarditis.  Patient is followed by infectious disease and on suppressive amoxicillin -resume amoxicillin after aspiration course    Hyperlipidemia -Continue atorvastatin   Diabetes mellitus type 2, well controlled Patient's last hemoglobin A1c was 5.7 on 07/05/2021.  Patient appears not to be on any occasion for treatment at this time.  Old records reviewed in assessment of this patient  Antimicrobials: Unasyn 6/7 >> current   DVT prophylaxis:   apixaban (ELIQUIS) tablet  5 mg   Code Status:   Code Status: DNR  Disposition Plan:  Home 1-2 days Status is: Inpt  Objective: Blood pressure 118/78, pulse 86, temperature 98.2 F (36.8 C), temperature source Axillary, resp. rate 18, height '5\' 7"'$  (1.702 m), weight 65.8 kg, SpO2 96 %.  Examination:  Physical Exam Constitutional:      Comments: Laying in bed in no distress with obvious decreased/slowed mentation  HENT:     Head: Normocephalic and atraumatic.     Mouth/Throat:     Mouth: Mucous membranes are moist.  Eyes:     Extraocular Movements: Extraocular movements intact.  Cardiovascular:     Rate and Rhythm: Normal rate.     Heart sounds: Normal heart sounds.  Pulmonary:     Effort: Pulmonary effort is normal. No respiratory distress.     Breath sounds: Normal breath sounds. No wheezing.  Abdominal:     General: Bowel sounds are normal. There is no distension.     Palpations: Abdomen is soft.     Tenderness: There is no abdominal tenderness.  Musculoskeletal:        General: Normal range of motion.     Cervical back: Normal range of motion and neck supple.  Skin:    General: Skin is warm and dry.  Neurological:     Comments: Slowed mentation.  Follows commands.  Residual left-sided weakness noted     Consultants:  Neurology  Procedures:    Data Reviewed: Results for orders placed or performed during the hospital encounter of 08/10/21 (from the past 24 hour(s))  CBC     Status: Abnormal   Collection Time: 08/11/21  4:08 AM  Result Value Ref Range   WBC 9.1 4.0 - 10.5 K/uL   RBC 3.12 (L) 4.22 - 5.81 MIL/uL   Hemoglobin 9.4 (L) 13.0 - 17.0 g/dL   HCT 28.7 (L) 39.0 - 52.0 %   MCV 92.0 80.0 - 100.0 fL   MCH 30.1 26.0 - 34.0 pg   MCHC 32.8 30.0 - 36.0 g/dL   RDW 15.4 11.5 - 15.5 %   Platelets 218 150 - 400 K/uL   nRBC 0.0 0.0 - 0.2 %  Comprehensive metabolic panel     Status: Abnormal   Collection Time: 08/11/21  4:08 AM  Result Value Ref Range   Sodium 140 135 - 145 mmol/L    Potassium 3.6 3.5 - 5.1 mmol/L   Chloride 109 98 - 111 mmol/L   CO2 24 22 - 32 mmol/L   Glucose, Bld 120 (H) 70 - 99 mg/dL   BUN 17 8 - 23 mg/dL   Creatinine, Ser 0.81 0.61 - 1.24 mg/dL   Calcium 8.6 (L) 8.9 - 10.3 mg/dL   Total Protein 5.8 (L) 6.5 - 8.1 g/dL   Albumin 3.1 (L) 3.5 - 5.0 g/dL   AST 27 15 - 41 U/L   ALT 23 0 - 44 U/L   Alkaline Phosphatase 80 38 - 126 U/L   Total Bilirubin 0.8 0.3 - 1.2 mg/dL   GFR, Estimated >60 >60 mL/min   Anion gap 7 5 - 15  Heparin level (unfractionated)     Status: Abnormal   Collection Time: 08/11/21  4:08 AM  Result Value Ref Range   Heparin Unfractionated >1.10 (H) 0.30 - 0.70 IU/mL  APTT     Status: Abnormal  Collection Time: 08/11/21  4:08 AM  Result Value Ref Range   aPTT 62 (H) 24 - 36 seconds  Magnesium     Status: None   Collection Time: 08/11/21  4:08 AM  Result Value Ref Range   Magnesium 1.9 1.7 - 2.4 mg/dL    I have Reviewed nursing notes, Vitals, and Lab results since pt's last encounter. Pertinent lab results : see above I have ordered test including BMP, CBC, Mg I have reviewed the last note from staff over past 24 hours I have discussed pt's care plan and test results with nursing staff, case manager   LOS: 0 days   Dwyane Dee, MD [facility] 08/11/2021, 5:32 PM

## 2021-08-11 NOTE — Hospital Course (Signed)
Mr. 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  He was witnessed by his wife to have right arm shaking followed by B/L arm shaking.  He has residual left-sided weakness from prior strokes.

## 2021-08-11 NOTE — Evaluation (Signed)
Occupational Therapy Evaluation Patient Details Name: [name] [name] MRN: [id number] DOB: Apr 05, 1940 Today's Date: 08/11/2021   History of Present Illness [name] [name] is a 81 y.o. male who came from [facility] after being found with seizure like activities. 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 Pt currently total A +2 for ADLs and bed mobility this session, following commands inconsistently. Pt with L hemiparesis and visual impairments. Pt presenting with impairments listed below, will follow acutely. Recommend SNF at d/c.     Recommendations for follow up therapy are one component of a multi-disciplinary discharge planning process, led by the attending physician.  Recommendations may be updated based on patient status, additional functional criteria and insurance authorization.   Follow Up Recommendations  Skilled nursing-short term rehab (<3 hours/day)    Assistance Recommended at Discharge Frequent or constant Supervision/Assistance  Patient can return home with the following Two people to help with walking and/or transfers;Two people to help with bathing/dressing/bathroom;Assistance with cooking/housework;Assistance with feeding;Direct supervision/assist for financial management;Direct supervision/assist for medications management;Assist for transportation;Help with stairs or ramp for entrance    Functional Status Assessment  Patient has had a recent decline in their functional status and demonstrates the ability to make  significant improvements in function in a reasonable and predictable amount of time.  Equipment Recommendations  None recommended by OT;Other (comment) (TBD)    Recommendations for Other Services       Precautions / Restrictions Precautions Precautions: Fall Restrictions Weight Bearing Restrictions: No      Mobility Bed Mobility Overal bed mobility: Needs Assistance Bed Mobility: Supine to Sit, Sit to Sidelying     Supine to sit: Total assist, +2 for physical assistance   Sit to sidelying: Total assist, +2 for physical assistance General bed mobility comments: HOB elevated and helicopter transfer performed to EOB, no active assist from patient, pt dependent to return to supine via sideling technique    Transfers                   General transfer comment: deferred      Balance Overall balance assessment: Needs assistance Sitting-balance support: Feet supported, Single extremity supported Sitting balance-Leahy Scale: Poor Sitting balance - Comments: posterior lean initially Postural control: Posterior lean, Left lateral lean                                 ADL either performed or assessed with clinical judgement   ADL Overall ADL's : Needs assistance/impaired Eating/Feeding: Maximal assistance;Sitting;Bed level   Grooming: Total assistance;Sitting   Upper Body Bathing: Total assistance;Bed level   Lower Body Bathing: Total assistance;Bed level   Upper Body Dressing : Total assistance;Bed level   Lower Body Dressing: Total assistance;Bed level   Toilet Transfer: Total assistance Toilet Transfer Details (indicate cue type and reason): catheter Toileting- Clothing Manipulation and Hygiene: Total assistance;Bed level       Functional mobility during ADLs: Total assistance       Vision Ability to See in Adequate Light: 1 Impaired  Patient Visual Report: Other (comment) (unable to state) Vision Assessment?: Vision impaired- to be  further tested in functional context Additional Comments: per wife pt with L visual field impairments, pt does not track to L     Perception     Praxis      Pertinent Vitals/Pain Pain Assessment Pain Assessment: Faces Pain Score: 0-No pain Faces Pain Scale: No hurt Pain Location: no grimacing with movement     Hand Dominance Right   Extremity/Trunk Assessment Upper Extremity Assessment Upper Extremity Assessment: RUE deficits/detail;LUE deficits/detail RUE Deficits / Details: 3-/5 shoulder strength/ROM, grip strength 4/5, diffiuclty with release RUE Coordination: decreased fine motor;decreased gross motor LUE Deficits / Details: no AROM, PROM 75% with subluxation noted, pt with digit contractures in 3-5 LUE: Subluxation noted LUE Sensation: decreased light touch;decreased proprioception LUE Coordination: decreased fine motor;decreased gross motor   Lower Extremity Assessment Lower Extremity Assessment: Defer to PT evaluation RLE Deficits / Details: at EOB when pt asked to kick R LE pt with noted effort, 1/5 LLE Deficits / Details: no active movement, increased tone limiting passive ROM capabilities   Cervical / Trunk Assessment Cervical / Trunk Assessment: Kyphotic   Communication Communication Communication: Expressive difficulties (garbled speech)   Cognition Arousal/Alertness: Awake/alert Behavior During Therapy: Flat affect Overall Cognitive Status: Impaired/Different from baseline Area of Impairment: Following commands, Attention                   Current Attention Level: Focused   Following Commands: Follows one step commands inconsistently       General Comments: 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 Pt with R gaze, minimal command follow, no vocalization, no initiation of tasks     General Comments  VSS on RA    Exercises     Shoulder Instructions      Home Living Family/patient expects to be discharged to:: Skilled nursing facility                                  Additional Comments: Has been at [facility] since 2022      Prior Functioning/Environment Prior Level of Function : Needs assist             Mobility Comments: per spouse, pt has started working on standing with therapy at SNF, unable to fully stand ADLs Comments: speech helps with feeding, can feed self sometimes, dependent with ADLs        OT Problem List: Decreased strength;Decreased range of motion;Decreased activity tolerance;Impaired balance (sitting and/or standing);Impaired vision/perception;Decreased coordination;Decreased cognition;Decreased safety awareness;Decreased knowledge of use of DME or AE;Decreased knowledge of precautions;Cardiopulmonary status limiting activity;Impaired sensation;Impaired tone;Impaired UE functional use      OT Treatment/Interventions: Self-care/ADL training;Therapeutic exercise;DME and/or AE instruction;Energy conservation;Therapeutic activities;Patient/family education;Balance training;Visual/perceptual remediation/compensation;Cognitive remediation/compensation;Neuromuscular education;Splinting;Manual therapy;Modalities    OT Goals(Current goals can be found in the care plan section) Acute Rehab OT Goals Patient Stated Goal: pt unable to state OT Goal Formulation: Patient unable to participate in goal setting Time For Goal Achievement: 08/25/21 Potential to Achieve Goals: Fair ADL Goals Pt Will Perform Eating: with mod assist;with adaptive utensils;bed level Pt Will Perform Grooming: with mod assist;with adaptive equipment;bed level Additional ADL Goal #1: Pt will follow single step command 75% of the time during session in order to improve participation in ADLs  OT Frequency: Min 2X/week    Co-evaluation PT/OT/SLP Co-Evaluation/Treatment: Yes Reason for Co-Treatment: Necessary to address cognition/behavior during functional  activity;For patient/therapist safety;To address functional/ADL transfers PT  goals addressed during session: Mobility/safety with mobility        AM-PAC OT "6 Clicks" Daily Activity     Outcome Measure Help from another person eating meals?: A Lot Help from another person taking care of personal grooming?: A Lot Help from another person toileting, which includes using toliet, bedpan, or urinal?: Total Help from another person bathing (including washing, rinsing, drying)?: Total Help from another person to put on and taking off regular upper body clothing?: Total Help from another person to put on and taking off regular lower body clothing?: Total 6 Click Score: 8   End of Session Nurse Communication: Mobility status  Activity Tolerance: Patient tolerated treatment well Patient left: in bed;with call bell/phone within reach;with bed alarm set;with family/visitor present  OT Visit Diagnosis: Unsteadiness on feet (R26.81);Other abnormalities of gait and mobility (R26.89);Muscle weakness (generalized) (M62.81);Low vision, both eyes (H54.2);Apraxia (R48.2);Feeding difficulties (R63.3);Other symptoms and signs involving cognitive function;Other symptoms and signs involving the nervous system (R29.898);Cognitive communication deficit (R41.841);Hemiplegia and hemiparesis Symptoms and signs involving cognitive functions: Cerebral infarction Hemiplegia - Right/Left: Right Hemiplegia - caused by: Cerebral infarction                Time: 1191-4782 OT Time Calculation (min): 32 min Charges:  OT General Charges $OT Visit: 1 Visit OT Evaluation $OT Eval Moderate Complexity: 1 Mod  Paloma [place]ange, OTD, OTR/L Acute Rehab [phone]) [phone] - [place] 08/11/2021, 1:41 PM

## 2021-08-11 NOTE — Progress Notes (Addendum)
Subjective: No further seizures overnight.  Per wife at bedside, patient is slowly returning to baseline.  She reports patient may have been stressed because she was supposed to go to their grandsons graduation.  ROS: Unable to obtain is minimally verbal  Examination  Vital signs in last 24 hours: Temp:  [97.7 F (36.5 C)-97.9 F (36.6 C)] 97.9 F (36.6 C) (06/07 0446) Pulse Rate:  [67-93] 71 (06/07 0446) Resp:  [14-28] 19 (06/07 0446) BP: (91-123)/(63-85) 101/63 (06/07 0446) SpO2:  [91 %-100 %] 96 % (06/07 0446) Weight:  [65.8 kg] 65.8 kg (06/06 1100)  General: lying in bed, NAD Neuro: Awake, alert, at times nods appropriately, did follow simple one-step commands like closing his eyes and sticking out his tongue, did not name objects, PERRLA, left hemineglect, 4/5 in right upper extremity, did not move left upper extremity but per wife can occasionally move it against gravity, did move bilateral lower extremities to tactile stimulation (I did not apply noxious stimulation as patient was awake)   Basic Metabolic Panel: Recent Labs  Lab 08/10/21 0937 08/10/21 0957 08/11/21 0408  NA 141 141 140  K 3.8 3.8 3.6  CL 111  --  109  CO2 16*  --  24  GLUCOSE 148*  --  120*  BUN 22  --  17  CREATININE 1.18  --  0.81  CALCIUM 8.8*  --  8.6*  MG  --   --  1.9    CBC: Recent Labs  Lab 08/10/21 0937 08/10/21 0957 08/11/21 0408  WBC 16.1*  --  9.1  NEUTROABS 14.3*  --   --   HGB 10.0* 9.9* 9.4*  HCT 31.6* 29.0* 28.7*  MCV 96.3  --  92.0  PLT 238  --  218     Coagulation Studies: Recent Labs    08/10/21 0937  LABPROT 15.9*  INR 1.3*    Imaging CT head without contrast 08/10/2021: No acute intracranial abnormality.  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 Atrophy with chronic small vessel white matter ischemic disease.   ASSESSMENT AND PLAN: 81 year old male with prior right MCA stroke, left frontal infarcts, epilepsy who presented with breakthrough seizures  in the setting of infection.  Epilepsy with breakthrough seizure -No further seizures overnight.  Breakthrough seizure in the setting of infection, possibly increased stress  Recommendations -Per wife, patient has been seizure-free for almost a year.  This seizure was provoked in the setting of possible infection and increased stress.  Wife also expressed concern about side effects including sedation.  Therefore will increase Vimpat only to 75 mg twice daily for now -Continue seizure precautions -Management of rest of comorbidities per primary team -Discussed plan with wife at bedside and Dr. Sabino Gasser -Follow-up with Dr. Brett Fairy and NP at [facility] already scheduled  ADDENDUM - Rescue medication: Intranasal valtoco 7.'5mg'$ in each nostril ( total '15mg'$ ) to be used for grand mal seizure lasting over 5 minutes. Can repeat once in 4 hours. Do Not use more than twice in 24 hours  I have spent a total of  45 minutes with the patient reviewing hospital notes,  test results, labs and examining the patient as well as establishing an assessment and plan that was discussed personally with the patient's wife.  > 50% of time was spent in direct patient care.   Zeb Comfort Epilepsy [facility] For questions after 5pm please refer to AMION to reach the Neurologist on call

## 2021-08-11 NOTE — Assessment & Plan Note (Addendum)
-   Seizure suspected to be breakthrough seizure.  Unclear etiology.  Some theories include possible underlying infection with reports of patient developing cough recently prior to his breakthrough seizure (infection precipitated).  Also was on lower dose of Vimpat which could have contributed - Neurology following, appreciate assistance.  Vimpat dose has been increased and adjusted. Continued on vimpat 75 mg BID

## 2021-08-12 DIAGNOSIS — A419 Sepsis, unspecified organism: Secondary | ICD-10-CM | POA: Diagnosis not present

## 2021-08-12 DIAGNOSIS — J189 Pneumonia, unspecified organism: Secondary | ICD-10-CM | POA: Diagnosis not present

## 2021-08-12 LAB — CBC WITH DIFFERENTIAL/PLATELET
Abs Immature Granulocytes: 0.02 10*3/uL (ref 0.00–0.07)
Basophils Absolute: 0.1 10*3/uL (ref 0.0–0.1)
Basophils Relative: 1 %
Eosinophils Absolute: 0.2 10*3/uL (ref 0.0–0.5)
Eosinophils Relative: 2 %
HCT: 29.8 % — ABNORMAL LOW (ref 39.0–52.0)
Hemoglobin: 9.4 g/dL — ABNORMAL LOW (ref 13.0–17.0)
Immature Granulocytes: 0 %
Lymphocytes Relative: 12 %
Lymphs Abs: 1.2 10*3/uL (ref 0.7–4.0)
MCH: 29.7 pg (ref 26.0–34.0)
MCHC: 31.5 g/dL (ref 30.0–36.0)
MCV: 94.3 fL (ref 80.0–100.0)
Monocytes Absolute: 0.9 10*3/uL (ref 0.1–1.0)
Monocytes Relative: 9 %
Neutro Abs: 7.8 10*3/uL — ABNORMAL HIGH (ref 1.7–7.7)
Neutrophils Relative %: 76 %
Platelets: 227 10*3/uL (ref 150–400)
RBC: 3.16 MIL/uL — ABNORMAL LOW (ref 4.22–5.81)
RDW: 15.6 % — ABNORMAL HIGH (ref 11.5–15.5)
WBC: 10.2 10*3/uL (ref 4.0–10.5)
nRBC: 0 % (ref 0.0–0.2)

## 2021-08-12 LAB — BASIC METABOLIC PANEL
Anion gap: 6 (ref 5–15)
BUN: 17 mg/dL (ref 8–23)
CO2: 23 mmol/L (ref 22–32)
Calcium: 8.7 mg/dL — ABNORMAL LOW (ref 8.9–10.3)
Chloride: 113 mmol/L — ABNORMAL HIGH (ref 98–111)
Creatinine, Ser: 0.83 mg/dL (ref 0.61–1.24)
GFR, Estimated: >60 mL/min (ref >60)
Glucose, Bld: 143 mg/dL — ABNORMAL HIGH (ref 70–99)
Potassium: 3.9 mmol/L (ref 3.5–5.1)
Sodium: 142 mmol/L (ref 135–145)

## 2021-08-12 LAB — MAGNESIUM: Magnesium: 2 mg/dL (ref 1.7–2.4)

## 2021-08-12 MED ORDER — LACOSAMIDE 50 MG PO TABS: 75.0000 mg | ORAL_TABLET | Freq: Two times a day (BID) | ORAL | 3 refills | Status: DC

## 2021-08-12 MED ORDER — VALTOCO 15 MG DOSE 7.5 MG/0.1ML NA LQPK: NASAL | 1 refills | Status: DC

## 2021-08-12 MED ORDER — AMOXICILLIN-POT CLAVULANATE 400-57 MG/5ML PO SUSR: 875.0000 mg | Freq: Two times a day (BID) | ORAL | 0 refills | Status: AC

## 2021-08-12 MED ORDER — AMOXICILLIN 400 MG/5ML PO SUSR: 500.0000 mg | Freq: Two times a day (BID) | ORAL | 0 refills | Status: DC

## 2021-08-12 NOTE — Progress Notes (Signed)
Discharged to SNF after IV accesses removed and discharge instructions given to wife and to facility to LPN Dollene [name].  No signs of distress.  Wife at side.  Transported by [facility].

## 2021-08-12 NOTE — Plan of Care (Signed)

## 2021-08-12 NOTE — TOC Initial Note (Signed)
Transition of Care [facility]) - Initial/Assessment Note    Patient Details  Name: [name] [name] MRN: [id number] Date of Birth: Jul 22, 1940  Transition of Care [facility]) CM/SW Contact:    Geralynn Ochs, LCSW Phone Number: 08/12/2021, 11:20 AM  Clinical Narrative:        CSW spoke with [facility] to confirm that patient is from SNF and can return at discharge. CSW to follow.           Expected Discharge Plan: [place] Barriers to Discharge: Continued Medical Work up   Patient Goals and CMS Choice Patient states their goals for this hospitalization and ongoing recovery are:: get back to [facility] CMS Medicare.gov Compare Post Acute Care list provided to:: Patient Choice offered to / list presented to : Patient  Expected Discharge Plan and Services Expected Discharge Plan: [place] Choice: [place] Living arrangements for the past 2 months: [place] Expected Discharge Date: 08/12/21                                    Prior Living Arrangements/Services Living arrangements for the past 2 months: [place] Lives with:: Facility Resident Patient language and need for interpreter reviewed:: No Do you feel safe going back to the place where you live?: Yes      Need for Family Participation in Patient Care: No (Comment) Care giver support system in place?: Yes (comment)   Criminal Activity/Legal Involvement Pertinent to Current Situation/Hospitalization: No - Comment as needed  Activities of Daily Living Home Assistive Devices/Equipment: None ADL Screening (condition at time of admission) Patient's cognitive ability adequate to safely complete daily activities?: No Is the patient deaf or have difficulty hearing?: No Does the patient have difficulty seeing, even when wearing glasses/contacts?: Yes Does the patient have difficulty concentrating, remembering, or making  decisions?: Yes Patient able to express need for assistance with ADLs?: No Does the patient have difficulty dressing or bathing?: Yes Independently performs ADLs?: No Communication: Dependent Is this a change from baseline?: Pre-admission baseline Dressing (OT): Dependent Is this a change from baseline?: Pre-admission baseline Grooming: Dependent Is this a change from baseline?: Pre-admission baseline Feeding: Dependent Is this a change from baseline?: Pre-admission baseline Bathing: Dependent Is this a change from baseline?: Pre-admission baseline Toileting: Dependent Is this a change from baseline?: Pre-admission baseline In/Out Bed: Dependent Is this a change from baseline?: Pre-admission baseline Walks in Home: Dependent Is this a change from baseline?: Pre-admission baseline Does the patient have difficulty walking or climbing stairs?: Yes Weakness of Legs: Both Weakness of Arms/Hands: Both  Permission Sought/Granted Permission sought to share information with : Facility Sport and exercise psychologist, Family Supports Permission granted to share information with : Yes, Verbal Permission Granted  Share Information with NAME: Herbert Pun  Permission granted to share info w AGENCY: [facility]  Permission granted to share info w Relationship: Spouse     Emotional Assessment Appearance:: Appears stated age Attitude/Demeanor/Rapport: Engaged Affect (typically observed): Appropriate Orientation: : Oriented to Self, Oriented to Place, Oriented to  Time, Oriented to Situation Alcohol / Substance Use: Not Applicable Psych Involvement: No (comment)  Admission diagnosis:  Seizure ([place]) [R56.9] Seizure-like activity ([place]) [R56.9] Elevated lactic acid level [R79.89] Leukocytosis, unspecified type [D72.829] Patient Active Problem List   Diagnosis Date Noted   Seizure ([place]) 08/10/2021   AKI (acute kidney injury) ([place]) 08/10/2021  Chronic systolic CHF (congestive heart failure) (HCC)  08/10/2021   Spastic hemiparesis of left nondominant side ([place]) 07/14/2021   Sleep disorder 06/02/2021   Prolonged Q-T interval on ECG 04/30/2021   Secondary hypercoagulable state ([place]) 02/01/2021   Acute on chronic diastolic CHF (congestive heart failure) ([place])    CHF exacerbation ([place]) 01/23/2021   Tremor of unknown origin 01/18/2021   Prosthetic valve endocarditis ([place])    Protein-calorie malnutrition, severe 12/18/2020   S/P MVR (mitral valve repair)    History of CVA in adulthood    History of aortic valve replacement    History of mitral valve replacement    Pressure injury of skin 12/14/2020   Hypernatremia 12/14/2020   Somnolence    Diabetes mellitus type II, controlled ([facility]) 10/15/2020   Oropharyngeal dysphagia    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[place])    Acute ischemic right MCA stroke ([place]) 08/15/2020   CVA (cerebral vascular accident) ([place]) 08/07/2020   Middle cerebral artery embolism, 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   Subdural hemorrhage following injury ([place]) 07/31/2020   S/P craniotomy 07/28/2020   Subdural hematoma ([place]) 07/27/2020   Vertigo 08/30/2018   S/P right THA, 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   History of food allergy 12/11/2017   Emesis 12/11/2017   Visit for monitoring Tikosyn therapy 06/06/2017   Internal hemorrhoid, bleeding 05/19/2016   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   Aortic valve disorder 07/09/2015   Long term current use of anticoagulant therapy 01/19/2015   Atrial fibrillation, chronic ([place]) 01/20/2014   Elevated PSA 12/23/2013   Prediabetes 12/19/2013   Benign prostatic hyperplasia with urinary obstruction 10/29/2013   left frontal cerebral infarct secondary to afib 08/05/2013   Atrial flutter ([place]) 05/17/2013   Essential hypertension 06/19/2012   Routine health maintenance 04/26/2011   Persistent atrial fibrillation ([place]) 07/10/2008   Hyperlipidemia with  target LDL less than 130 07/07/2008   History of colonic polyps 06/16/2008   ERECTILE DYSFUNCTION 09/25/2007   PAF (paroxysmal atrial fibrillation) ([place]) 09/24/2007   PCP:  [name], MD Pharmacy:   [place], [place] - [address] [place] [address] Phone: [phone] Fax: [phone]     Social Determinants of Health (SDOH) Interventions    Readmission Risk Interventions     No data to display

## 2021-08-12 NOTE — Discharge Summary (Addendum)
Physician Discharge Summary   MACAULEY MOSSBERG TGG:[id number] DOB: Jun 26, 1940 DOA: 08/10/2021  PCP: [name], MD  Admit date: 08/10/2021 Discharge date: 08/12/2021  Admitted From: [facility] Disposition:  [facility] Discharging physician: Dwyane Dee, MD  Recommendations for Outpatient Follow-up:  Repeat CXR in 4-5 weeks for pna resolution Follow up with neurology Continue incentive spirometer use as able    Discharge Condition: stable CODE STATUS: DNR Diet recommendation:  Diet Orders (From admission, onward)     Start     Ordered   08/10/21 2156  DIET DYS 3 Room service appropriate? Yes; Fluid consistency: Thin  Diet effective now       Question Answer Comment  Room service appropriate? Yes   Fluid consistency: Thin      08/10/21 2155            Hospital Course: Mr. 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  He was witnessed by his wife to have right arm shaking followed by B/L arm shaking.  He has residual left-sided weakness from prior strokes.  Assessment and Plan: * Seizure ([place]) - Seizure suspected to be breakthrough seizure.  Unclear etiology.  Some theories include possible underlying infection with reports of patient developing cough recently prior to his breakthrough seizure (infection precipitated).  Also was on lower dose of Vimpat which could have contributed - Neurology following, appreciate assistance.  Vimpat dose has been increased and adjusted. Continued on vimpat 75 mg BID  Sepsis due to pneumonia (HCC)-resolved as of 08/12/2021 - Does have some bilateral interstitial opacities appreciated on CXR.  Not unreasonable to  consider possible aspiration during seizure but may have also had some component of pneumonia developing prior to which could have also precipitated seizure - On vancomycin and cefepime on admission.  Given altered cognition, will discontinue cefepime at this time in efforts to avoid further contribution.  Start on Unasyn to cover for aspiration. Transitioned to augmentin to complete course.  After course, patient will be resumed back on his chronic amoxicillin - needs repeat CXR in 4-5 weeks - encourage incentive spirometer use   Acute kidney injury - baseline creatinine ~ 0.7 - patient presents with increase in creat >0.3 mg/dL above baseline, creat increase >1.5x baseline presumed to have occurred within past 7 days PTA - creat 1.18 on admission; CK 44 on admission - s/p IVF - creat back to baseline; 0.83 at discharge   Dysphagia -Continue dysphagia level 3 diet   Paroxysmal atrial fibrillation/flutter on chronic anticoagulation -continue eliquis   Heart failure with reduced EF Stable.  Patient does not appear to be overloaded at this time.  Echocardiogram from 03/31/2021 noted EF to be 45-50% with indeterminate diastolic function.   History of rheumatic heart disease s/p remote bioprosthetic AVR/MVR   Normocytic anemia - remains near baseline; no issues   History of subdural hematoma History of CVA History of endocarditis Patient had history of subdural hematoma in May 2022.  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  Thereafter patient had left cerebellar vermis stroke thought to be secondary to septic emboli in the setting of mitral  valve endocarditis.  Patient is followed by infectious disease and on suppressive amoxicillin -resume amoxicillin after augmentin course    Hyperlipidemia -Continue atorvastatin   Diabetes  mellitus type 2, well controlled Patient's last hemoglobin A1c was 5.7 on 07/05/2021.  - continue diet control    Principal Diagnosis: Seizure [facility])  Discharge Diagnoses: Principal Problem:   Seizure ([place]) Active Problems:   AKI (acute kidney injury) ([place])   Oropharyngeal dysphagia   PAF (paroxysmal atrial fibrillation) ([place])   Essential hypertension   Long term current use of anticoagulant therapy   Subdural hematoma (HCC)   Diabetes mellitus type II, controlled ([place])   Acute on chronic diastolic CHF (congestive heart failure) (HCC)   Chronic systolic CHF (congestive heart failure) ([place])   Discharge Instructions     Increase activity slowly   Complete by: As directed       Allergies as of 08/12/2021       Reactions   Naproxen Hives   No [facility] Food Allergies Other (See Comments)   Scallops - distress, nausea and vomitting        Medication List     STOP taking these medications    Armodafinil 50 MG tablet       TAKE these medications    acetaminophen 325 MG tablet Commonly known as: TYLENOL Take 650 mg by mouth every 6 (six) hours as needed for mild pain.   amantadine 100 MG capsule Commonly known as: SYMMETREL Take 100 mg by mouth 2 (two) times daily. Between 8-11 am and 12-3 pm   amLODipine 5 MG tablet Commonly known as: NORVASC Take 5 mg by mouth daily.   amoxicillin 400 MG/5ML suspension Commonly known as: AMOXIL Take 6.3 mLs (500 mg total) by mouth 2 (two) times daily. Start taking on: August 18, 2021 What changed: These instructions start on August 18, 2021. If you are unsure what to do until then, ask your doctor or other care provider.   amoxicillin-clavulanate 400-57 MG/5ML suspension Commonly known as: AUGMENTIN Take 10.9 mLs (875 mg total) by mouth 2 (two) times daily for 5 days.   apixaban 5 MG Tabs tablet Commonly known as: ELIQUIS Take 1 tablet (5 mg total) by mouth 2 (two) times daily.   atorvastatin 80 MG tablet Commonly known  as: LIPITOR Take 1 tablet (80 mg total) by mouth daily.   BIOTENE MOISTURIZING MOUTH MT Use as directed 2 sprays in the mouth or throat in the morning, at noon, and at bedtime.   bisacodyl 10 MG suppository Commonly known as: DULCOLAX Place 1 suppository (10 mg total) rectally daily as needed for moderate constipation or mild constipation.   clobetasol 0.05 % external solution Commonly known as: TEMOVATE Apply 1 application. topically 2 (two) times daily as needed (itching).   dofetilide 125 MCG capsule Commonly known as: TIKOSYN Take 3 capsules (375 mcg total) by mouth 2 (two) times daily.   famotidine 20 MG tablet Commonly known as: PEPCID Take 20 mg by mouth at bedtime as needed for indigestion.   finasteride 5 MG tablet Commonly known as: PROSCAR Take 1 tablet (5 mg total) by mouth daily.   Glucerna Liqd Take 237 mLs by mouth daily.   hydrocortisone 2.5 % cream Apply 1 application. topically daily as needed (itching).   lacosamide 50 MG Tabs tablet Commonly known as: VIMPAT Take 1.5 tablets (75 mg total) by mouth 2 (two) times daily. What changed: how much to take   levalbuterol 0.63 MG/3ML nebulizer solution Commonly known as:  XOPENEX Take 0.63 mg by nebulization every 6 (six) hours as needed for wheezing or shortness of breath.   magnesium gluconate 500 MG tablet Commonly known as: MAGONATE Take 1 tablet (500 mg total) by mouth daily.   melatonin 5 MG Tabs Take 5 mg by mouth at bedtime.   methenamine 1 g tablet Commonly known as: HIPREX Take 1 g by mouth every 12 (twelve) hours.   multivitamin with minerals Tabs tablet Take 1 tablet by mouth daily.   polyethylene glycol 17 g packet Commonly known as: MIRALAX / GLYCOLAX Take 17 g by mouth every other day.   potassium chloride 10 MEQ tablet Commonly known as: KLOR-CON M Take 1 tablet (10 mEq total) by mouth daily.   senna-docusate 8.6-50 MG tablet Commonly known as: Senokot-S Take 1 tablet by mouth  at bedtime as needed.   Systane 0.4-0.3 % Soln Generic drug: Polyethyl Glycol-Propyl Glycol Place 2 drops into both eyes daily.   torsemide 20 MG tablet Commonly known as: DEMADEX Take 20 mg by mouth daily.   Valtoco 15 MG Dose 2 x 7.5 MG/0.1ML Lqpk Generic drug: diazePAM (15 MG Dose) Place 7.5 mg into each nostril (total of 15 mg) for grand mal seizure lasting over 5 minutes. Can repeat once in 4 hours. Do Not use more than twice in 24 hours        Contact information for after-discharge care     Destination     [facility] .   Service: Skilled Nursing Contact information: [place] [phone]                    Allergies  Allergen Reactions   Naproxen Hives   No [facility] Food Allergies Other (See Comments)    Scallops - distress, nausea and vomitting    Consultations: Neurology  Procedures:   Discharge Exam: BP 138/88 (BP Location: Left Arm)   Pulse 82   Temp 98 F (36.7 C) (Oral)   Resp 16   Ht '5\' 7"'$  (1.702 m)   Wt 65.8 kg   SpO2 93%   BMI 22.71 kg/m  Physical Exam Constitutional:      Comments: Laying in bed in no distress with obvious decreased/slowed mentation  HENT:     Head: Normocephalic and atraumatic.     Mouth/Throat:     Mouth: Mucous membranes are moist.  Eyes:     Extraocular Movements: Extraocular movements intact.  Cardiovascular:     Rate and Rhythm: Normal rate.     Heart sounds: Normal heart sounds.  Pulmonary:     Effort: Pulmonary effort is normal. No respiratory distress.     Breath sounds: No wheezing.     Comments: Mild bibasilar rhonchi Abdominal:     General: Bowel sounds are normal. There is no distension.     Palpations: Abdomen is soft.     Tenderness: There is no abdominal tenderness.  Musculoskeletal:        General: Normal range of motion.     Cervical back: Normal range of motion and neck supple.  Skin:    General:  Skin is warm and dry.  Neurological:     Comments: Slowed mentation.  Follows commands.  Residual left-sided weakness noted      The results of significant diagnostics from this hospitalization (including imaging, microbiology, ancillary and laboratory) are listed below for reference.   Microbiology: Recent Results (from the past 240 hour(s))  Culture, blood (  single)     Status: None (Preliminary result)   Collection Time: 08/10/21 10:38 AM   Specimen: BLOOD  Result Value Ref Range Status   Specimen Description BLOOD SITE NOT SPECIFIED  Final   Special Requests   Final    BOTTLES DRAWN AEROBIC AND ANAEROBIC Blood Culture adequate volume   Culture   Final    NO GROWTH < 24 HOURS Performed at [facility], [address], [place], [place] [address]    Report Status PENDING  Incomplete  Culture, blood (single)     Status: None (Preliminary result)   Collection Time: 08/10/21 11:49 AM   Specimen: BLOOD RIGHT WRIST  Result Value Ref Range Status   Specimen Description BLOOD RIGHT WRIST  Final   Special Requests   Final    BOTTLES DRAWN AEROBIC AND ANAEROBIC Blood Culture adequate volume   Culture   Final    NO GROWTH < 24 HOURS Performed at [facility], [place] [address] [place], [place] [address]    Report Status PENDING  Incomplete  Urine Culture     Status: None   Collection Time: 08/10/21 11:35 PM   Specimen: Urine, Clean Catch  Result Value Ref Range Status   Specimen Description URINE, CLEAN CATCH  Final   Special Requests NONE  Final   Culture   Final    NO GROWTH Performed at [facility], [place] [address] [place],  [address]    Report Status 08/11/2021 FINAL  Final     Labs: BNP (last 3 results) Recent Labs    01/08/21 0000 01/20/21 1631 01/23/21 1355  BNP 319.00 286.6* 450.3*   Basic Metabolic Panel: Recent Labs  Lab 08/10/21 0937 08/10/21 0957 08/11/21 0408 08/12/21 0107  NA 141 141 140 142  K 3.8 3.8 3.6 3.9  CL 111  --   109 113*  CO2 16*  --  24 23  GLUCOSE 148*  --  120* 143*  BUN 22  --  17 17  CREATININE 1.18  --  0.81 0.83  CALCIUM 8.8*  --  8.6* 8.7*  MG  --   --  1.9 2.0   Liver Function Tests: Recent Labs  Lab 08/10/21 0937 08/11/21 0408  AST 29 27  ALT 26 23  ALKPHOS 93 80  BILITOT 0.5 0.8  PROT 6.4* 5.8*  ALBUMIN 3.3* 3.1*   No results for input(s): "LIPASE", "AMYLASE" in the last 168 hours. No results for input(s): "AMMONIA" in the last 168 hours. CBC: Recent Labs  Lab 08/10/21 0937 08/10/21 0957 08/11/21 0408 08/12/21 0107  WBC 16.1*  --  9.1 10.2  NEUTROABS 14.3*  --   --  7.8*  HGB 10.0* 9.9* 9.4* 9.4*  HCT 31.6* 29.0* 28.7* 29.8*  MCV 96.3  --  92.0 94.3  PLT 238  --  218 227   Cardiac Enzymes: Recent Labs  Lab 08/10/21 0937  CKTOTAL 44*   BNP: Invalid input(s): "POCBNP" CBG: No results for input(s): "GLUCAP" in the last 168 hours. D-Dimer No results for input(s): "DDIMER" in the last 72 hours. Hgb A1c No results for input(s): "HGBA1C" in the last 72 hours. Lipid Profile No results for input(s): "CHOL", "HDL", "LDLCALC", "TRIG", "CHOLHDL", "LDLDIRECT" in the last 72 hours. Thyroid function studies No results for input(s): "TSH", "T4TOTAL", "T3FREE", "THYROIDAB" in the last 72 hours.  Invalid input(s): "FREET3" Anemia work up No results for input(s): "VITAMINB12", "FOLATE", "FERRITIN", "TIBC", "IRON", "RETICCTPCT" in the last 72  hours. Urinalysis    Component Value Date/Time   COLORURINE YELLOW 08-31-21 1040   APPEARANCEUR HAZY (A) 08-31-2021 1040   LABSPEC 1.013 31-Aug-2021 1040   PHURINE 5.0 31-Aug-2021 1040   GLUCOSEU NEGATIVE 2021/08/31 1040   HGBUR NEGATIVE August 31, 2021 1040   BILIRUBINUR NEGATIVE 08-31-2021 1040   BILIRUBINUR negative 11/22/2013 0848   KETONESUR NEGATIVE 08/31/2021 1040   PROTEINUR 30 (A) 08-31-2021 1040   UROBILINOGEN 0.2 11/22/2013 0848   UROBILINOGEN 1.0 08/05/2013 2207   NITRITE NEGATIVE 2021/08/31 1040   LEUKOCYTESUR  NEGATIVE 08-31-21 1040   Sepsis Labs Recent Labs  Lab 08-31-21 0937 08/11/21 0408 08/12/21 0107  WBC 16.1* 9.1 10.2   Microbiology Recent Results (from the past 240 hour(s))  Culture, blood (single)     Status: None (Preliminary result)   Collection Time: 08/31/21 10:38 AM   Specimen: BLOOD  Result Value Ref Range Status   Specimen Description BLOOD SITE NOT SPECIFIED  Final   Special Requests   Final    BOTTLES DRAWN AEROBIC AND ANAEROBIC Blood Culture adequate volume   Culture   Final    NO GROWTH < 24 HOURS Performed at [facility], [place] [address] [place], [place] [address]    Report Status PENDING  Incomplete  Culture, blood (single)     Status: None (Preliminary result)   Collection Time: 2021-08-31 11:49 AM   Specimen: BLOOD RIGHT WRIST  Result Value Ref Range Status   Specimen Description BLOOD RIGHT WRIST  Final   Special Requests   Final    BOTTLES DRAWN AEROBIC AND ANAEROBIC Blood Culture adequate volume   Culture   Final    NO GROWTH < 24 HOURS Performed at [facility], [place] [address] [place], [place] [address]    Report Status PENDING  Incomplete  Urine Culture     Status: None   Collection Time: 2021/08/31 11:35 PM   Specimen: Urine, Clean Catch  Result Value Ref Range Status   Specimen Description URINE, CLEAN CATCH  Final   Special Requests NONE  Final   Culture   Final    NO GROWTH Performed at [facility], [place] [address], [place],  [address]    Report Status 08/11/2021 FINAL  Final    Procedures/Studies: EEG adult  Result Date: 31-Aug-2021 Lora Havens, MD     08/31/21  5:38 PM Patient Name: SOCORRO EBRON MRN: [id number] Epilepsy Attending: Lora Havens Referring Physician/Provider: Norval Morton, MD Date: 31-Aug-2021 Duration: 24.01 mins Patient history: 81 year old male with breakthrough seizure in the setting of possible bronchitis or pneumonia. EEG to evaluate for seizure. Level of alertness:  lethargic AEDs  during EEG study: LCM Technical aspects: This EEG study was done with scalp electrodes positioned according to the 10-20 International system of electrode placement. Electrical activity was acquired at a sampling rate of '500Hz'$  and reviewed with a high frequency filter of '70Hz'$  and a low frequency filter of '1Hz'$ . EEG data were recorded continuously and digitally stored. Description: No clear posterior dominant rhythm was seen. EEG showed continuous 5 to 8 Hz theta-alpha activity in left hemisphere. There is low amplitude 2-'3hz'$  delta slowing in right hemisphere.   Hyperventilation and photic stimulation were not performed.   ABNORMALITY - Continuous slow, generalized and lateralized right hemisphere IMPRESSION: This study is suggestive of cortical dysfunction arising from right hemisphere likely secondary to underlying structural abnormality. Additionally there is moderate diffuse encephalopathy, nonspecific etiology. No seizures or epileptiform discharges were  seen throughout the recording. Lora Havens   CT Head Wo Contrast  Result Date: 08/10/2021 CLINICAL DATA:  Mental status changes. EXAM: CT HEAD WITHOUT CONTRAST TECHNIQUE: Contiguous axial images were obtained from the base of the skull through the vertex without intravenous contrast. RADIATION DOSE REDUCTION: This exam was performed according to the departmental dose-optimization program which includes automated exposure control, adjustment of the mA and/or kV according to patient size and/or use of iterative reconstruction technique. COMPARISON:  01/23/2021.  12/14/2020. FINDINGS: Brain: There is no evidence for acute hemorrhage, hydrocephalus, mass lesion, or abnormal extra-axial fluid collection. No definite CT evidence for acute infarction. Diffuse loss of parenchymal volume is consistent with atrophy. Patchy low attenuation in the deep hemispheric and periventricular white matter is nonspecific, but likely reflects chronic microvascular ischemic  demyelination. Global ventriculomegaly likely a combination of atrophy and previous infarcts. Stable appearance right frontotemporal parietal encephalomalacia consistent with MCA territory infarct. Encephalomalacia in the left parietal lobe is stable. Vascular: No hyperdense vessel or unexpected calcification. Skull: Right craniotomy defect again noted. Sinuses/Orbits: The visualized paranasal sinuses and mastoid air cells are clear. Visualized portions of the globes and intraorbital fat are unremarkable. Other: None. IMPRESSION: 1. No acute intracranial abnormality. 2. 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 3. Atrophy with chronic small vessel white matter ischemic disease. Electronically Signed   By: Misty Stanley M.D.   On: 08/10/2021 11:44   DG Chest Port 1 View  Result Date: 08/10/2021 CLINICAL DATA:  Altered mental status. EXAM: PORTABLE CHEST 1 VIEW COMPARISON:  January 23, 2021. FINDINGS: Patchy bilateral airspace opacities are mildly improved. Visible pleural effusions or pneumothorax. Similar cardiomediastinal silhouette. Cardiac valve replacements. Median sternotomy. No evidence of acute osseous abnormality. IMPRESSION: Patchy bilateral airspace opacities are mildly improved. Electronically Signed   By: Margaretha Sheffield M.D.   On: 08/10/2021 10:16     Time coordinating discharge: Over 30 minutes    Dwyane Dee, MD  [facility] 08/12/2021, 10:59 AM

## 2021-08-12 NOTE — NC FL2 (Signed)
[place] LEVEL OF CARE SCREENING TOOL     IDENTIFICATION  Patient Name: [name] [name] Birthdate: 08/01/1940 Sex: male Admission Date (Current Location): 08/10/2021  [facility] and [place] Number:  Herbalist and Address:  [facility], [place] [address] [place], [place] [address]      Provider Number: [id number]  Attending Physician Name and Address:  Dwyane Dee, MD  Relative Name and Phone Number:       Current Level of Care: Hospital Recommended Level of Care: [place] Prior Approval Number:    Date Approved/Denied:   PASRR Number:    Discharge Plan: SNF    Current Diagnoses: Patient Active Problem List   Diagnosis Date Noted   Seizure ([place]) 08/10/2021   AKI (acute kidney injury) ([place]) 50/05/7046   Chronic systolic CHF (congestive heart failure) ([place]) 08/10/2021   Spastic hemiparesis of left nondominant side ([place]) 07/14/2021   Sleep disorder 06/02/2021   Prolonged Q-T interval on ECG 04/30/2021   Secondary hypercoagulable state ([place]) 02/01/2021   Acute on chronic diastolic CHF (congestive heart failure) ([place])    CHF exacerbation ([place]) 01/23/2021   Tremor of unknown origin 01/18/2021   Prosthetic valve endocarditis ([place])    Protein-calorie malnutrition, severe 12/18/2020   S/P MVR (mitral valve repair)    History of CVA in adulthood    History of aortic valve replacement    History of mitral valve replacement    Pressure injury of skin 12/14/2020   Hypernatremia 12/14/2020   Somnolence    Diabetes mellitus type II, controlled ([place]) 10/15/2020   Oropharyngeal dysphagia    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[place])    Acute ischemic right MCA stroke ([place]) 08/15/2020   CVA (cerebral vascular accident) ([place]) 08/07/2020   Middle cerebral artery embolism, 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   Subdural hemorrhage following injury ([place]) 07/31/2020   S/P craniotomy 07/28/2020    Subdural hematoma ([place]) 07/27/2020   Vertigo 08/30/2018   S/P right THA, 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   History of food allergy 12/11/2017   Emesis 12/11/2017   Visit for monitoring Tikosyn therapy 06/06/2017   Internal hemorrhoid, bleeding 05/19/2016   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   Aortic valve disorder 07/09/2015   Long term current use of anticoagulant therapy 01/19/2015   Atrial fibrillation, chronic ([place]) 01/20/2014   Elevated PSA 12/23/2013   Prediabetes 12/19/2013   Benign prostatic hyperplasia with urinary obstruction 10/29/2013   left frontal cerebral infarct secondary to afib 08/05/2013   Atrial flutter ([place]) 05/17/2013   Essential hypertension 06/19/2012   Routine health maintenance 04/26/2011   Persistent atrial fibrillation ([place]) 07/10/2008   Hyperlipidemia with target LDL less than 130 07/07/2008   History of colonic polyps 06/16/2008   ERECTILE DYSFUNCTION 09/25/2007   PAF (paroxysmal atrial fibrillation) ([place]) 09/24/2007    Orientation RESPIRATION BLADDER Height & Weight     Self, Time, Situation, Place  Normal Incontinent Weight: 145 lb (65.8 kg) Height:  '5\' 7"'$  (170.2 cm)  BEHAVIORAL SYMPTOMS/MOOD NEUROLOGICAL BOWEL NUTRITION STATUS      Continent, Incontinent (incontinent at times) Diet (see DC summary)  AMBULATORY STATUS COMMUNICATION OF NEEDS Skin   Extensive Assist Verbally Normal                       Personal Care Assistance Level of Assistance  Bathing, Feeding, Dressing Bathing  Assistance: Maximum assistance Feeding assistance: Limited assistance Dressing Assistance: Maximum assistance     Functional Limitations Info             SPECIAL CARE FACTORS FREQUENCY                       Contractures Contractures Info: Not present    Additional Factors Info  Code Status, Allergies Code Status Info: DNR Allergies Info: Naproxen, No Healthtouch Food Allergies            Current Medications (08/12/2021):  This is the current hospital active medication list Current Facility-Administered Medications  Medication Dose Route Frequency Provider Last Rate Last Admin   0.9 %  sodium chloride infusion   Intravenous Continuous Fuller Plan A, MD 75 mL/hr at 08/11/21 1801 Infusion Verify at 08/11/21 1801   acetaminophen (TYLENOL) tablet 650 mg  650 mg Oral Q6H PRN Norval Morton, MD       Or   acetaminophen (TYLENOL) suppository 650 mg  650 mg Rectal Q6H PRN Fuller Plan A, MD       amantadine (SYMMETREL) capsule 100 mg  100 mg Oral BID [name], Rondell A, MD   100 mg at 08/12/21 0846   amLODipine (NORVASC) tablet 5 mg  5 mg Oral Daily Smith, Rondell A, MD   5 mg at 08/12/21 1027   Ampicillin-Sulbactam (UNASYN) 3 g in sodium chloride 0.9 % 100 mL IVPB  3 g Intravenous Q6H Dwyane Dee, MD 200 mL/hr at 08/12/21 0853 3 g at 08/12/21 0853   apixaban (ELIQUIS) tablet 5 mg  5 mg Oral BID Dwyane Dee, MD   5 mg at 08/12/21 1027   atorvastatin (LIPITOR) tablet 80 mg  80 mg Oral Daily [name], Rondell A, MD   80 mg at 08/12/21 1027   dofetilide (TIKOSYN) capsule 375 mcg  375 mcg Oral BID Fuller Plan A, MD   375 mcg at 08/12/21 1026   famotidine (PEPCID) tablet 20 mg  20 mg Oral QHS PRN Fuller Plan A, MD       feeding supplement (ENSURE ENLIVE / ENSURE PLUS) liquid 237 mL  237 mL Oral BID BM Smith, Rondell A, MD   237 mL at 08/12/21 1028   finasteride (PROSCAR) tablet 5 mg  5 mg Oral Daily Smith, Rondell A, MD   5 mg at 08/12/21 1027   lacosamide (VIMPAT) tablet 75 mg  75 mg Oral BID Lora Havens, MD   75 mg at 08/12/21 1027   levalbuterol (XOPENEX) nebulizer solution 0.63 mg  0.63 mg Nebulization Q6H PRN Fuller Plan A, MD       MEDLINE mouth rinse  15 mL Mouth Rinse BID Smith, Rondell A, MD   15 mL at 08/12/21 1027   melatonin tablet 5 mg  5 mg Oral QHS Smith, Rondell A, MD   5 mg at 08/11/21 2050   ondansetron (ZOFRAN) tablet 4 mg  4 mg Oral Q6H PRN Fuller Plan A, MD       Or   ondansetron (ZOFRAN) injection 4 mg  4 mg Intravenous Q6H PRN Smith, Rondell A, MD       polyethylene glycol (MIRALAX / GLYCOLAX) packet 17 g  17 g Oral QODAY Smith, Rondell A, MD   17 g at 08/11/21 0916   polyvinyl alcohol (LIQUIFILM TEARS) 1.4 % ophthalmic solution 2 drop  2 drop Both Eyes Daily PRN Norval Morton, MD       senna-docusate (Senokot-S)  tablet 1 tablet  1 tablet Oral QHS PRN Fuller Plan A, MD       sodium chloride flush (NS) 0.9 % injection 3 mL  3 mL Intravenous Q12H Smith, Rondell A, MD   3 mL at 08/12/21 1028   torsemide (DEMADEX) tablet 20 mg  20 mg Oral Daily Fuller Plan A, MD   20 mg at 08/12/21 1027     Discharge Medications: Please see discharge summary for a list of discharge medications.  Relevant Imaging Results:  Relevant Lab Results:   Additional Information SS#: [id number]  Geralynn Ochs, LCSW

## 2021-08-12 NOTE — TOC Transition Note (Signed)
Transition of Care [facility]) - CM/SW Discharge Note   Patient Details  Name: [name] [name] MRN: [id number] Date of Birth: 08-17-1940  Transition of Care [facility] CM/SW Contact:  Geralynn Ochs, LCSW Phone Number: 08/12/2021, 11:21 AM   Clinical Narrative:   CSW confirmed with MD that patient is stable for return to [facility] today. CSW sent discharge information to [facility] and updated spouse at bedside. Transport scheduled with [facility] for next available.  Nurse to call report to [phone], [place] [facility], Room 127    Final next level of care: Skilled Nursing Facility Barriers to Discharge: Barriers Resolved   Patient Goals and CMS Choice Patient states their goals for this hospitalization and ongoing recovery are:: get back to [facility] CMS Medicare.gov Compare Post Acute Care list provided to:: Patient Choice offered to / list presented to : Patient  Discharge Placement              Patient chooses bed at: [facility] Patient to be transferred to facility by: [place] Name of family member notified: Spouse Patient and family notified of of transfer: 08/12/21  Discharge Plan and Services     Post Acute Care Choice: [name]                               Social Determinants of Health (SDOH) Interventions     Readmission Risk Interventions     No data to display

## 2021-08-13 ENCOUNTER — Telehealth: Payer: Self-pay

## 2021-08-13 DIAGNOSIS — G4089 Other seizures: Secondary | ICD-10-CM | POA: Diagnosis not present

## 2021-08-13 DIAGNOSIS — R41841 Cognitive communication deficit: Secondary | ICD-10-CM | POA: Diagnosis not present

## 2021-08-13 DIAGNOSIS — I69391 Dysphagia following cerebral infarction: Secondary | ICD-10-CM | POA: Diagnosis not present

## 2021-08-13 DIAGNOSIS — I69354 Hemiplegia and hemiparesis following cerebral infarction affecting left non-dominant side: Secondary | ICD-10-CM | POA: Diagnosis not present

## 2021-08-13 NOTE — Telephone Encounter (Signed)
Transition Care Management Unsuccessful Follow-up Telephone Call  Date of discharge and from where:  [facility], 08/12/2021  Attempts:  1st Attempt  Reason for unsuccessful TCM follow-up call:  Unable to reach patient, due to release into a skilled nursing facility.

## 2021-08-15 LAB — CULTURE, BLOOD (SINGLE)
Culture: NO GROWTH
Culture: NO GROWTH
Special Requests: ADEQUATE
Special Requests: ADEQUATE

## 2021-08-16 ENCOUNTER — Non-Acute Institutional Stay (SKILLED_NURSING_FACILITY): Payer: Medicare Other | Admitting: Internal Medicine

## 2021-08-16 ENCOUNTER — Encounter: Payer: Self-pay | Admitting: Internal Medicine

## 2021-08-16 DIAGNOSIS — N400 Enlarged prostate without lower urinary tract symptoms: Secondary | ICD-10-CM | POA: Diagnosis not present

## 2021-08-16 DIAGNOSIS — I4819 Other persistent atrial fibrillation: Secondary | ICD-10-CM

## 2021-08-16 DIAGNOSIS — Z9189 Other specified personal risk factors, not elsewhere classified: Secondary | ICD-10-CM | POA: Diagnosis not present

## 2021-08-16 DIAGNOSIS — I330 Acute and subacute infective endocarditis: Secondary | ICD-10-CM

## 2021-08-16 DIAGNOSIS — E782 Mixed hyperlipidemia: Secondary | ICD-10-CM | POA: Diagnosis not present

## 2021-08-16 DIAGNOSIS — I5032 Chronic diastolic (congestive) heart failure: Secondary | ICD-10-CM

## 2021-08-16 DIAGNOSIS — R400 Somnolence: Secondary | ICD-10-CM | POA: Diagnosis not present

## 2021-08-16 DIAGNOSIS — I69391 Dysphagia following cerebral infarction: Secondary | ICD-10-CM | POA: Diagnosis not present

## 2021-08-16 DIAGNOSIS — I38 Endocarditis, valve unspecified: Secondary | ICD-10-CM | POA: Diagnosis not present

## 2021-08-16 DIAGNOSIS — E118 Type 2 diabetes mellitus with unspecified complications: Secondary | ICD-10-CM | POA: Diagnosis not present

## 2021-08-16 DIAGNOSIS — D638 Anemia in other chronic diseases classified elsewhere: Secondary | ICD-10-CM

## 2021-08-16 DIAGNOSIS — J189 Pneumonia, unspecified organism: Secondary | ICD-10-CM | POA: Diagnosis not present

## 2021-08-16 DIAGNOSIS — I69354 Hemiplegia and hemiparesis following cerebral infarction affecting left non-dominant side: Secondary | ICD-10-CM | POA: Diagnosis not present

## 2021-08-16 DIAGNOSIS — I10 Essential (primary) hypertension: Secondary | ICD-10-CM

## 2021-08-16 DIAGNOSIS — E785 Hyperlipidemia, unspecified: Secondary | ICD-10-CM | POA: Diagnosis not present

## 2021-08-16 DIAGNOSIS — G4089 Other seizures: Secondary | ICD-10-CM | POA: Diagnosis not present

## 2021-08-16 DIAGNOSIS — R569 Unspecified convulsions: Secondary | ICD-10-CM | POA: Diagnosis not present

## 2021-08-16 DIAGNOSIS — T826XXD Infection and inflammatory reaction due to cardiac valve prosthesis, subsequent encounter: Secondary | ICD-10-CM

## 2021-08-16 DIAGNOSIS — R41841 Cognitive communication deficit: Secondary | ICD-10-CM | POA: Diagnosis not present

## 2021-08-16 LAB — BASIC METABOLIC PANEL
BUN: 19 (ref 4–21)
CO2: 21 (ref 13–22)
Chloride: 107 (ref 99–108)
Creatinine: 0.6 (ref 0.6–1.3)
Glucose: 99
Potassium: 4.2 mEq/L (ref 3.5–5.1)
Sodium: 140 (ref 137–147)

## 2021-08-16 LAB — COMPREHENSIVE METABOLIC PANEL: Calcium: 8.8 (ref 8.7–10.7)

## 2021-08-16 NOTE — Telephone Encounter (Signed)
I spoke to this wife and she accepted an appt with Dr. [name] on 08/19/21.

## 2021-08-16 NOTE — Progress Notes (Signed)
Provider:  Veleta Miners MD Location:   [facility] Room Number: 295 Place of Service:  SNF [phone])  PCP: [name] Dad, MD Patient Care Team: [name] Dad, MD as PCP - General (Internal Medicine) [name] Dresser, MD as PCP - Cardiology (Cardiology) [name] Crocker, MD as Consulting Physician (Ophthalmology) [name] Lima, MD (Internal Medicine)  Extended Emergency Contact Information Primary Emergency Contact: [name] [name] Address: [id number]          [place], [place] [address] [place] Phone: [phone] Mobile Phone: [phone] Relation: Spouse Secondary Emergency Contact: [name] [name],  [id number] [name] [name] of [place] Phone: [phone] Relation: Son  Code Status: DNR Goals of Care: Advanced Directive information    08/16/2021    9:26 AM  Advanced Directives  Does Patient Have a Medical Advance Directive? Yes  Type of Paramedic of [place];Living will;Out of facility DNR (pink MOST or yellow form)  Does patient want to make changes to medical advance directive? No - Patient declined  Copy of [place] in Chart? Yes - validated most recent copy scanned in chart (See row information)      Chief Complaint  Patient presents with   Readmit To SNF    HPI: Patient is a 81 y.o. 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 CT scan did not show anything acute EEG Negative for seizures Xray showed Bilateral Patchy infilterate   Vimpat was  incrased to 75 mg  Pneumonia Treated with Augmentin Now in SNF Had been more sleepy not responding  Not Following any commands Runny nose noticed also drooling when Nurses feeding him No Cough or SOB or fever No seizure noticed     Past Medical History:  Diagnosis Date   Acute rheumatic heart disease, unspecified    childhood, age  51 & 15   Acute rheumatic pericarditis    Atrial fibrillation ([place])    history   CHF (congestive heart failure) ([place])    Diverticulosis    Dysrhythmia    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   External hemorrhoids without mention of complication    H/O aortic valve replacement    H/O mitral valve replacement    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   Lesion of ulnar nerve    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    Other and unspecified hyperlipidemia    Pre-diabetes    Previous back surgery 1978, jan 2007   Psychosexual dysfunction with inhibited sexual excitement    SOB (shortness of breath)    "with heavy exercise"   Stroke ([name]) 08/2013   "I WAS IN AFIB AND THREW A CLOT, THE EFFECTS WERE TRANSITORY AND DIDNT LAST BUT FOR 30 MINUTES"    Thoracic aortic aneurysm [facility])    Past Surgical History:  Procedure Laterality Date   AORTIC AND MITRAL VALVE REPLACEMENT     09/2004   CARDIOVERSION     3 times from 2004-2006   CARDIOVERSION N/A 09/26/2013   Procedure: CARDIOVERSION;  Surgeon: [name] Dresser, MD;  Location: [place];  Service: Cardiovascular;  Laterality: N/A;   CARDIOVERSION N/A 06/19/2014   Procedure: CARDIOVERSION;  Surgeon: Jerline Pain, MD;  Location: [place];  Service: Cardiovascular;  Laterality: N/A;   CARDIOVERSION N/A 04/24/2017   Procedure: CARDIOVERSION;  Surgeon: [name] Dresser, MD;   Location: [place];  Service: Cardiovascular;  Laterality: N/A;   CARDIOVERSION N/A 06/08/2017   Procedure: CARDIOVERSION;  Surgeon: Pixie Casino, MD;  Location: [facility] ENDOSCOPY;  Service: Cardiovascular;  Laterality: N/A;   CARDIOVERSION N/A 08/24/2017   Procedure: CARDIOVERSION;  Surgeon: Skeet Latch, MD;  [facility] ENDOSCOPY;  Service: Cardiovascular;  Laterality: N/A;   CARDIOVERSION N/A 02/14/2018   Procedure: CARDIOVERSION;  Surgeon: [name] Dresser, MD;  Location: [facility] ENDOSCOPY;  Service: Cardiovascular;  Laterality: N/A;   COLONOSCOPY     CRANIOTOMY N/A 07/28/2020   Procedure: CRANIOTOMY FOR EVACUATION OF SUBDURAL HEMATOMA;  Surgeon: Eustace Oconnor, MD;  Location: [place];  Service: Neurosurgery;  Laterality: N/A;   IR ANGIO VERTEBRAL SEL SUBCLAVIAN INNOMINATE UNI R MOD SED  08/07/2020   IR CT HEAD LTD  08/07/2020   IR PERCUTANEOUS ART THROMBECTOMY/INFUSION INTRACRANIAL INC DIAG ANGIO  08/07/2020   laminectomies     10/1975 and in 03/2005   RADIOLOGY WITH ANESTHESIA N/A 08/07/2020   Procedure: IR WITH ANESTHESIA;  Surgeon: Luanne Bras, MD;  Location: [place];  Service: Radiology;  Laterality: N/A;   TEE WITHOUT CARDIOVERSION N/A 12/18/2020   Procedure: TRANSESOPHAGEAL ECHOCARDIOGRAM (TEE);  Surgeon: [name] Dresser, MD;  Location: [facility] ENDOSCOPY;  Service: Cardiovascular;  Laterality: N/A;   [place] Right 04/03/2018   Procedure: TOTAL HIP ARTHROPLASTY ANTERIOR APPROACH;  Surgeon: Paralee Cancel, MD;  Location: [facility];  Service: Orthopedics;  Laterality: Right;  70 mins    reports that he has never smoked. He has never used smokeless tobacco. He reports that he does not drink alcohol and does not use drugs. Social History   Socioeconomic History   Marital status: Married    Spouse name: [name] [name]   Number of children: 2   Years of education: BS   Highest education level: Not on file  Occupational History   Occupation: retired  Tobacco  Use   Smoking status: Never   Smokeless tobacco: Never  Vaping Use   Vaping Use: Never used  Substance and Sexual Activity   Alcohol use: Never    Comment: quit 5/22   Drug use: Never   Sexual activity: Not Currently    Partners: Female  Other Topics Concern   Not on file  Social History Narrative   10/27/20 residing at [facility] care center since 09/17/20    [employer] - BS, JD. married - '67. 2 sons - '74, '77  one son is a Nurse, learning disability for [place]; 4 grandchildren. 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 marriage in good health. End-of-life: discussed issues and provided packet with the MOST form and out of facility order.      Caffeine: decaf coffee   Social Determinants of Health   Financial Resource Strain: Low Risk  (06/27/2018)   Overall Financial Resource Strain (CARDIA)  Difficulty of Paying Living Expenses: Not hard at all  Food Insecurity: No Food Insecurity (06/27/2018)   Hunger Vital Sign    Worried About Running Out of Food in the Last Year: Never true    Ran Out of Food in the Last Year: Never true  Transportation Needs: No Transportation Needs (06/27/2018)   PRAPARE - Hydrologist (Medical): No    Lack of Transportation (Non-Medical): No  Physical Activity: Not on file  Stress: Not on file  Social Connections: Not on file  Intimate Partner Violence: Not on file    Functional Status Survey:    Family History  Problem Relation Age of Onset   Macular degeneration Mother        macular degeneration   Cancer Father        intestinal/GI   Diabetes Paternal Aunt    Heart attack Maternal Grandfather    Diabetes Brother     Health Maintenance  Topic Date Due   FOOT EXAM  Never done   OPHTHALMOLOGY EXAM  Never done   URINE MICROALBUMIN  Never done   COVID-19 Vaccine (4 - Booster for Moderna series) 03/09/2021   INFLUENZA VACCINE   10/05/2021   HEMOGLOBIN A1C  01/05/2022   TETANUS/TDAP  03/22/2028   Pneumonia Vaccine 52+ Years old  Completed   Zoster Vaccines- Shingrix  Completed   HPV VACCINES  Aged Out   COLONOSCOPY (Pts 45-65yr Insurance coverage will need to be confirmed)  Discontinued   Hepatitis C Screening  Discontinued    Allergies  Allergen Reactions   Naproxen Hives   No [facility] Food Allergies Other (See Comments)    Scallops - distress, nausea and vomitting    Allergies as of 08/16/2021       Reactions   Naproxen Hives   No [facility] Food Allergies Other (See Comments)   Scallops - distress, nausea and vomitting        Medication List        Accurate as of August 16, 2021  9:27 AM. If you have any questions, ask your nurse or doctor.          STOP taking these medications    antiseptic oral rinse Liqd Stopped by: AVirgie Dad MD       TAKE these medications    acetaminophen 325 MG tablet Commonly known as: TYLENOL Take 650 mg by mouth every 6 (six) hours as needed for mild pain.   amantadine 100 MG capsule Commonly known as: SYMMETREL Take 100 mg by mouth 2 (two) times daily. Between 8-11 am and 12-3 pm   amLODipine 5 MG tablet Commonly known as: NORVASC Take 5 mg by mouth daily.   amoxicillin 400 MG/5ML suspension Commonly known as: AMOXIL Take 6.3 mLs (500 mg total) by mouth 2 (two) times daily. Start taking on: August 18, 2021   amoxicillin-clavulanate 400-57 MG/5ML suspension Commonly known as: AUGMENTIN Take 10.9 mLs (875 mg total) by mouth 2 (two) times daily for 5 days.   apixaban 5 MG Tabs tablet Commonly known as: ELIQUIS Take 1 tablet (5 mg total) by mouth 2 (two) times daily.   atorvastatin 80 MG tablet Commonly known as: LIPITOR Take 1 tablet (80 mg total) by mouth daily.   BIOTENE MOISTURIZING MOUTH MT Use as directed 2 sprays in the mouth or throat in the morning, at noon, and at bedtime.   bisacodyl 10 MG suppository Commonly known  as: DULCOLAX Place 1 suppository (10 mg  total) rectally daily as needed for moderate constipation or mild constipation.   clobetasol 0.05 % external solution Commonly known as: TEMOVATE Apply 1 application. topically 2 (two) times daily as needed (itching).   dofetilide 125 MCG capsule Commonly known as: TIKOSYN Take 3 capsules (375 mcg total) by mouth 2 (two) times daily.   famotidine 20 MG tablet Commonly known as: PEPCID Take 20 mg by mouth at bedtime as needed for indigestion.   finasteride 5 MG tablet Commonly known as: PROSCAR Take 1 tablet (5 mg total) by mouth daily.   Glucerna Liqd Take 237 mLs by mouth daily.   hydrocortisone 2.5 % cream Apply 1 application. topically daily as needed (itching).   lacosamide 50 MG Tabs tablet Commonly known as: VIMPAT Take 1.5 tablets (75 mg total) by mouth 2 (two) times daily.   levalbuterol 0.63 MG/3ML nebulizer solution Commonly known as: XOPENEX Take 0.63 mg by nebulization every 6 (six) hours as needed for wheezing or shortness of breath.   magnesium gluconate 500 MG tablet Commonly known as: MAGONATE Take 1 tablet (500 mg total) by mouth daily.   melatonin 5 MG Tabs Take 5 mg by mouth at bedtime.   methenamine 1 g tablet Commonly known as: HIPREX Take 1 g by mouth every 12 (twelve) hours.   multivitamin with minerals Tabs tablet Take 1 tablet by mouth daily.   polyethylene glycol 17 g packet Commonly known as: MIRALAX / GLYCOLAX Take 17 g by mouth every other day.   potassium chloride 10 MEQ tablet Commonly known as: KLOR-CON M Take 1 tablet (10 mEq total) by mouth daily.   senna-docusate 8.6-50 MG tablet Commonly known as: Senokot-S Take 1 tablet by mouth at bedtime as needed.   Systane 0.4-0.3 % Soln Generic drug: Polyethyl Glycol-Propyl Glycol Place 2 drops into both eyes daily.   torsemide 20 MG tablet Commonly known as: DEMADEX Take 20 mg by mouth daily.   Valtoco 15 MG Dose 2 x 7.5 MG/0.1ML  Lqpk Generic drug: diazePAM (15 MG Dose) Place 7.5 mg into each nostril (total of 15 mg) for grand mal seizure lasting over 5 minutes. Can repeat once in 4 hours. Do Not use more than twice in 24 hours        Review of Systems  Unable to perform ROS: Other    Vitals:   08/16/21 0858  BP: 111/75  Pulse: 85  Resp: 15  Temp: 98.6 F (37 C)  SpO2: 97%  Weight: 145 lb 9.6 oz (66 kg)  Height: '5\' 7"'$  (1.702 m)   Body mass index is 22.8 kg/m. Physical Exam Vitals reviewed.  Constitutional:      Appearance: Normal appearance.     Comments: Sleepy  HENT:     Head: Normocephalic.     Nose: Nose normal.     Mouth/Throat:     Mouth: Mucous membranes are moist.     Pharynx: Oropharynx is clear.  Eyes:     Pupils: Pupils are equal, round, and reactive to light.  Cardiovascular:     Rate and Rhythm: Normal rate and regular rhythm.     Pulses: Normal pulses.     Heart sounds: No murmur heard. Pulmonary:     Effort: Pulmonary effort is normal. No respiratory distress.     Breath sounds: Normal breath sounds. No rales.  Abdominal:     General: Abdomen is flat. Bowel sounds are normal.     Palpations: Abdomen is soft.  Musculoskeletal:  General: No swelling.     Cervical back: Neck supple.  Skin:    General: Skin is warm.  Neurological:     Comments: Has Left sided Hemiparesis Not responding or following commands today  Psychiatric:        Mood and Affect: Mood normal.        Thought Content: Thought content normal.    Labs reviewed: Basic Metabolic Panel: Recent Labs    12/18/20 1630 12/19/20 0610 12/19/20 1632 12/20/20 0153 02/08/21 0000 02/09/21 0000 08/10/21 3546 08/10/21 0957 08/11/21 0408 08/12/21 0107  NA  --  139  --    < > 150*   < > 141 141 140 142  K  --  3.5  --    < > 3.7   < > 3.8 3.8 3.6 3.9  CL  --  108  --    < > 107   < > 111  --  109 113*  CO2  --  23  --    < > 22   < > 16*  --  24 23  GLUCOSE  --  186*  --    < >  --    < > 148*  --   120* 143*  BUN  --  16  --    < > 23*   < > 22  --  17 17  CREATININE  --  0.76  --    < > 0.9   < > 1.18  --  0.81 0.83  CALCIUM  --  7.7*  --    < > 9.9   < > 8.8*  --  8.6* 8.7*  MG 1.9 2.0 1.9   < > 2.1  --   --   --  1.9 2.0  PHOS 3.1 2.7 2.4*  --   --   --   --   --   --   --    < > = values in this interval not displayed.   Liver Function Tests: Recent Labs    04/19/21 0000 05/11/21 1132 07/12/21 1056 08/10/21 0937 08/11/21 0408  AST 23   < > '22 29 27  '$ ALT 20   < > '22 26 23  '$ ALKPHOS 175*  --   --  93 80  BILITOT  --    < > 0.7 0.5 0.8  PROT  --    < > 7.2 6.4* 5.8*  ALBUMIN 3.6  --   --  3.3* 3.1*   < > = values in this interval not displayed.   No results for input(s): "LIPASE", "AMYLASE" in the last 8760 hours. Recent Labs    12/14/20 1639 01/24/21 0157  AMMONIA 25 22   CBC: Recent Labs    07/12/21 1056 08/10/21 0937 08/10/21 0957 08/11/21 0408 08/12/21 0107  WBC 9.0 16.1*  --  9.1 10.2  NEUTROABS 6,804 14.3*  --   --  7.8*  HGB 10.9* 10.0* 9.9* 9.4* 9.4*  HCT 33.9* 31.6* 29.0* 28.7* 29.8*  MCV 90.4 96.3  --  92.0 94.3  PLT 426* 238  --  218 227   Cardiac Enzymes: Recent Labs    08/10/21 0937  CKTOTAL 44*   BNP: Invalid input(s): "POCBNP" Lab Results  Component Value Date   HGBA1C 5.7 07/05/2021   Lab Results  Component Value Date   TSH 1.773 01/24/2021   Lab Results  Component Value Date   FKCLEXNT70 017 (H) 01/24/2021   Lab Results  Component Value Date   FOLATE 19.8 01/24/2021   Lab Results  Component Value Date   IRON 20 (L) 01/24/2021   TIBC 190 (L) 01/24/2021   FERRITIN 238 01/24/2021    Imaging and Procedures obtained prior to SNF admission: EEG adult  Result Date: 08/10/2021 Lora Havens, MD     08/10/2021  5:38 PM Patient Name: [name] [name] MRN: [id number] Epilepsy Attending: Lora Havens Referring Physician/Provider: Norval Morton, MD Date: 08/10/2021 Duration: 24.01 mins Patient history: 81 year old male with  breakthrough seizure in the setting of possible bronchitis or pneumonia. EEG to evaluate for seizure. Level of alertness:  lethargic AEDs during EEG study: LCM Technical aspects: This EEG study was done with scalp electrodes positioned according to the 10-20 International system of electrode placement. Electrical activity was acquired at a sampling rate of '500Hz'$  and reviewed with a high frequency filter of '70Hz'$  and a low frequency filter of '1Hz'$ . EEG data were recorded continuously and digitally stored. Description: No clear posterior dominant rhythm was seen. EEG showed continuous 5 to 8 Hz theta-alpha activity in left hemisphere. There is low amplitude 2-'3hz'$  delta slowing in right hemisphere.   Hyperventilation and photic stimulation were not performed.   ABNORMALITY - Continuous slow, generalized and lateralized right hemisphere IMPRESSION: This study is suggestive of cortical dysfunction arising from right hemisphere likely secondary to underlying structural abnormality. Additionally there is moderate diffuse encephalopathy, nonspecific etiology. No seizures or epileptiform discharges were seen throughout the recording. Lora Havens   CT Head Wo Contrast  Result Date: 08/10/2021 CLINICAL DATA:  Mental status changes. EXAM: CT HEAD WITHOUT CONTRAST TECHNIQUE: Contiguous axial images were obtained from the base of the skull through the vertex without intravenous contrast. RADIATION DOSE REDUCTION: This exam was performed according to the departmental dose-optimization program which includes automated exposure control, adjustment of the mA and/or kV according to patient size and/or use of iterative reconstruction technique. COMPARISON:  01/23/2021.  12/14/2020. FINDINGS: Brain: There is no evidence for acute hemorrhage, hydrocephalus, mass lesion, or abnormal extra-axial fluid collection. No definite CT evidence for acute infarction. Diffuse loss of parenchymal volume is consistent with atrophy. Patchy low  attenuation in the deep hemispheric and periventricular white matter is nonspecific, but likely reflects chronic microvascular ischemic demyelination. Global ventriculomegaly likely a combination of atrophy and previous infarcts. Stable appearance right frontotemporal parietal encephalomalacia consistent with MCA territory infarct. Encephalomalacia in the left parietal lobe is stable. Vascular: No hyperdense vessel or unexpected calcification. Skull: Right craniotomy defect again noted. Sinuses/Orbits: The visualized paranasal sinuses and mastoid air cells are clear. Visualized portions of the globes and intraorbital fat are unremarkable. Other: None. IMPRESSION: 1. No acute intracranial abnormality. 2. 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 3. Atrophy with chronic small vessel white matter ischemic disease. Electronically Signed   By: Misty Stanley M.D.   On: 08/10/2021 11:44   DG Chest Port 1 View  Result Date: 08/10/2021 CLINICAL DATA:  Altered mental status. EXAM: PORTABLE CHEST 1 VIEW COMPARISON:  January 23, 2021. FINDINGS: Patchy bilateral airspace opacities are mildly improved. Visible pleural effusions or pneumothorax. Similar cardiomediastinal silhouette. Cardiac valve replacements. Median sternotomy. No evidence of acute osseous abnormality. IMPRESSION: Patchy bilateral airspace opacities are mildly improved. Electronically Signed   By: Margaretha Sheffield M.D.   On: 08/10/2021 10:16    Assessment/Plan 1. Seizure ([place]) Now on Vimpat higher dose Also on Nasal Diazepam for rescue But now patient is more sleepy Early Neurology follow up to  evaluate  2. Pneumonia of both lower lobes due to infectious organism Finishing Augmentin  3. Persistent atrial fibrillation (HCC) On Eliquis and Tikosyn  4. Prosthetic valve endocarditis, subsequent encounter Suppressive therapy with Amoxicillin  5. Chronic diastolic CHF (congestive heart failure) (HCC) On Low dose of Toresimide  6.  Mixed hyperlipidemia statin  7. Sleepiness ? 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

## 2021-08-16 NOTE — Telephone Encounter (Signed)
Tammy, RN, from [employer] left a voicemail with our office this afternoon.  She would like the appointment on July 17 to be moved up.  She reports that patient has been having seizure activity.  His Vimpat was increased to 75 mg.  Patient has been increasingly more sleepy.  It appears that the appointment on July 17 is for a CPAP follow-up.  Patient may need to see Janett Billow, NP for seizure activity.  Tammy can be reached at [phone].

## 2021-08-16 NOTE — Telephone Encounter (Signed)
Complicated patient with recurrent seizures. Would recommend follow up with Dr. [name] to ensure he is currently on appropriate seizure management. Can schedule f/u visit with him, please cancel f/u visit scheduled in September. He can continue to follow with Dr. Brett Fairy for CPAP. If CPAP visits transition to one of the NP's in the future, would request he is seen by [facility] or Amy as it would be too much for a 30 min visit to cover stroke and seizure as well as CPAP. Thank you.

## 2021-08-16 NOTE — Telephone Encounter (Signed)
Seizure management comes first but also the cpap machine was set up 07/13/2021 as a FYI and would need to have the initial CPAP f/u must take place 31-90 days from the date of set up. 6/10-10/13/2021. 7/17 apt should stay on the books to ensure that the patient has initial cpap visit scheduled for insurance purposes.

## 2021-08-17 ENCOUNTER — Telehealth: Payer: Self-pay | Admitting: Neurology

## 2021-08-17 DIAGNOSIS — I63412 Cerebral infarction due to embolism of left middle cerebral artery: Secondary | ICD-10-CM

## 2021-08-17 DIAGNOSIS — I69354 Hemiplegia and hemiparesis following cerebral infarction affecting left non-dominant side: Secondary | ICD-10-CM | POA: Diagnosis not present

## 2021-08-17 DIAGNOSIS — M6389 Disorders of muscle in diseases classified elsewhere, multiple sites: Secondary | ICD-10-CM | POA: Diagnosis not present

## 2021-08-17 DIAGNOSIS — S065X3S Traumatic subdural hemorrhage with loss of consciousness of 1 hour to 5 hours 59 minutes, sequela: Secondary | ICD-10-CM | POA: Diagnosis not present

## 2021-08-17 DIAGNOSIS — I484 Atypical atrial flutter: Secondary | ICD-10-CM

## 2021-08-17 DIAGNOSIS — R41840 Attention and concentration deficit: Secondary | ICD-10-CM | POA: Diagnosis not present

## 2021-08-17 DIAGNOSIS — I330 Acute and subacute infective endocarditis: Secondary | ICD-10-CM

## 2021-08-17 DIAGNOSIS — G4739 Other sleep apnea: Secondary | ICD-10-CM

## 2021-08-17 DIAGNOSIS — R278 Other lack of coordination: Secondary | ICD-10-CM | POA: Diagnosis not present

## 2021-08-17 DIAGNOSIS — R293 Abnormal posture: Secondary | ICD-10-CM | POA: Diagnosis not present

## 2021-08-17 DIAGNOSIS — I69391 Dysphagia following cerebral infarction: Secondary | ICD-10-CM | POA: Diagnosis not present

## 2021-08-17 DIAGNOSIS — I69812 Visuospatial deficit and spatial neglect following other cerebrovascular disease: Secondary | ICD-10-CM | POA: Diagnosis not present

## 2021-08-17 DIAGNOSIS — R41841 Cognitive communication deficit: Secondary | ICD-10-CM | POA: Diagnosis not present

## 2021-08-17 DIAGNOSIS — I5022 Chronic systolic (congestive) heart failure: Secondary | ICD-10-CM | POA: Diagnosis not present

## 2021-08-17 DIAGNOSIS — H538 Other visual disturbances: Secondary | ICD-10-CM | POA: Diagnosis not present

## 2021-08-17 DIAGNOSIS — G4089 Other seizures: Secondary | ICD-10-CM | POA: Diagnosis not present

## 2021-08-17 NOTE — Telephone Encounter (Signed)
I recommend an attended PAP titration- this allows EEG monitoring and change to biPAP or other modalities if central apneas emerge.  We will order an attended titration.

## 2021-08-17 NOTE — Telephone Encounter (Signed)
Noted  

## 2021-08-17 NOTE — Telephone Encounter (Signed)
Pt is currently using CPAP and was set up 07/13/21 through [facility]. Pt is compliant with use and upon reviewing data due to frequent seizures that are occurring, I noted on the DL that patient appears to have AHI (elevated central apnea events) pt has an appt with Dr [name] for his neuro concerns this week but wanted to also bring this information to Dr Dohmeier's attention to see if we need to make changes to his settings or complete a titration study. Will await her recommendation.

## 2021-08-18 ENCOUNTER — Encounter: Payer: Medicare Other | Admitting: Physical Medicine & Rehabilitation

## 2021-08-18 DIAGNOSIS — I69391 Dysphagia following cerebral infarction: Secondary | ICD-10-CM | POA: Diagnosis not present

## 2021-08-18 DIAGNOSIS — G4089 Other seizures: Secondary | ICD-10-CM | POA: Diagnosis not present

## 2021-08-18 DIAGNOSIS — I69354 Hemiplegia and hemiparesis following cerebral infarction affecting left non-dominant side: Secondary | ICD-10-CM | POA: Diagnosis not present

## 2021-08-18 DIAGNOSIS — M6389 Disorders of muscle in diseases classified elsewhere, multiple sites: Secondary | ICD-10-CM | POA: Diagnosis not present

## 2021-08-18 DIAGNOSIS — R278 Other lack of coordination: Secondary | ICD-10-CM | POA: Diagnosis not present

## 2021-08-18 DIAGNOSIS — R293 Abnormal posture: Secondary | ICD-10-CM | POA: Diagnosis not present

## 2021-08-18 DIAGNOSIS — R41840 Attention and concentration deficit: Secondary | ICD-10-CM | POA: Diagnosis not present

## 2021-08-18 DIAGNOSIS — I5022 Chronic systolic (congestive) heart failure: Secondary | ICD-10-CM | POA: Diagnosis not present

## 2021-08-18 DIAGNOSIS — R41841 Cognitive communication deficit: Secondary | ICD-10-CM | POA: Diagnosis not present

## 2021-08-18 DIAGNOSIS — I69812 Visuospatial deficit and spatial neglect following other cerebrovascular disease: Secondary | ICD-10-CM | POA: Diagnosis not present

## 2021-08-18 DIAGNOSIS — S065X3S Traumatic subdural hemorrhage with loss of consciousness of 1 hour to 5 hours 59 minutes, sequela: Secondary | ICD-10-CM | POA: Diagnosis not present

## 2021-08-18 DIAGNOSIS — H538 Other visual disturbances: Secondary | ICD-10-CM | POA: Diagnosis not present

## 2021-08-19 ENCOUNTER — Telehealth: Payer: Self-pay | Admitting: Neurology

## 2021-08-19 ENCOUNTER — Ambulatory Visit (INDEPENDENT_AMBULATORY_CARE_PROVIDER_SITE_OTHER): Payer: Medicare Other | Admitting: Neurology

## 2021-08-19 ENCOUNTER — Other Ambulatory Visit: Payer: Self-pay | Admitting: Neurology

## 2021-08-19 VITALS — BP 121/72 | HR 73

## 2021-08-19 DIAGNOSIS — I69812 Visuospatial deficit and spatial neglect following other cerebrovascular disease: Secondary | ICD-10-CM | POA: Diagnosis not present

## 2021-08-19 DIAGNOSIS — I63412 Cerebral infarction due to embolism of left middle cerebral artery: Secondary | ICD-10-CM | POA: Diagnosis not present

## 2021-08-19 DIAGNOSIS — R569 Unspecified convulsions: Secondary | ICD-10-CM | POA: Diagnosis not present

## 2021-08-19 DIAGNOSIS — M6389 Disorders of muscle in diseases classified elsewhere, multiple sites: Secondary | ICD-10-CM | POA: Diagnosis not present

## 2021-08-19 DIAGNOSIS — S065X3S Traumatic subdural hemorrhage with loss of consciousness of 1 hour to 5 hours 59 minutes, sequela: Secondary | ICD-10-CM | POA: Diagnosis not present

## 2021-08-19 DIAGNOSIS — H538 Other visual disturbances: Secondary | ICD-10-CM | POA: Diagnosis not present

## 2021-08-19 DIAGNOSIS — R41841 Cognitive communication deficit: Secondary | ICD-10-CM | POA: Diagnosis not present

## 2021-08-19 DIAGNOSIS — R41840 Attention and concentration deficit: Secondary | ICD-10-CM | POA: Diagnosis not present

## 2021-08-19 DIAGNOSIS — G4089 Other seizures: Secondary | ICD-10-CM | POA: Diagnosis not present

## 2021-08-19 DIAGNOSIS — G4719 Other hypersomnia: Secondary | ICD-10-CM | POA: Diagnosis not present

## 2021-08-19 DIAGNOSIS — I5022 Chronic systolic (congestive) heart failure: Secondary | ICD-10-CM | POA: Diagnosis not present

## 2021-08-19 DIAGNOSIS — I69354 Hemiplegia and hemiparesis following cerebral infarction affecting left non-dominant side: Secondary | ICD-10-CM | POA: Diagnosis not present

## 2021-08-19 DIAGNOSIS — R278 Other lack of coordination: Secondary | ICD-10-CM | POA: Diagnosis not present

## 2021-08-19 DIAGNOSIS — R293 Abnormal posture: Secondary | ICD-10-CM | POA: Diagnosis not present

## 2021-08-19 DIAGNOSIS — I69391 Dysphagia following cerebral infarction: Secondary | ICD-10-CM | POA: Diagnosis not present

## 2021-08-19 MED ORDER — LACOSAMIDE 50 MG PO TABS
50.0000 mg | ORAL_TABLET | Freq: Two times a day (BID) | ORAL | 3 refills | Status: DC
Start: 2021-08-19 — End: 2021-12-24

## 2021-08-19 MED ORDER — LACOSAMIDE 50 MG PO TABS: 50.0000 mg | ORAL_TABLET | Freq: Two times a day (BID) | ORAL | 3 refills | Status: DC

## 2021-08-19 NOTE — Telephone Encounter (Signed)
Called the wife back and advised that CPAP should still be used cause while he is still having events he is still being better treated under the CPAP.  I updated the wife in regards to the pt needing a cpap titration while she was in the office at the visit today.  Given his medical history he has to be transported by [employer] lift and our sleep lab doesn't have the capability to accommodate this here. I contacted [facility], since they are in a hospital, to ask if they are able to accommodate these pt's and was advised they do not.  I am unsure how we go about getting the pt what he needs to be successfully treated without him undergoing the titration study. Advised I will call [facility] to get their opinion/thoughts on this situation.

## 2021-08-19 NOTE — Progress Notes (Signed)
[facility] [address] [place] [address] [phone]       HOSPITAL FOLLOW UP NOTE  [name] [name] Date of Birth:  Jul 06, 1940 Medical Record Number:  [id number]   Reason for Referral:  hospital stroke follow up    SUBJECTIVE:   CHIEF COMPLAINT:  Chief Complaint  Patient presents with   Follow-up    Room 33, with wife     HPI:   [name] [name] is a 81 y.o. male with PMHx of HTN, HLD, DM, hx of prior stroke 2015, aortic valve replacement, mitral valve replacement, acute rheumatic pericarditis (age 13/7), diastolic CHF, AVR/MVR and A. fib/A-flutter on Coumadin/Tikosyn who presented on 07/26/2020 after a fall at [place] falling backwards and striking his head - ED eval no acute findings and discharged home. Returned on 5/23 with difficulty speaking and right-sided weakness with imaging now showing large right greater than left traumatic subdural hemorrhage with mass-effect and subfalcine herniation.  Initially poor prognosis with family electing comfort care measures however patient started showing improvement and taken the OR for right craniotomy with evacuation of SDH by Dr. Ronnald Ramp.  Continue to be limited by left greater than right-sided weakness with visual deficits, poor safety awareness, delirium and cognitive deficits.  CIR recommended and discharged on 07/31/2020.  On 6/3, he was found to have decreased LOC with left-sided weakness and right inattention.  Code stroke activated and CTA showed occluded right ICA s/p cerebral angio with revascularization of occluded right ICA terminus. MR brain revealed acute infarct throughout majority of right MCA territory with some sparing of anterior insula and frontal operculum, petechial blood products and mass-effect with 7.5 mm right to left shift.  Mentation initially improving however repeat CT head showed 9 mm midline shift -hypertonic saline initiated.  Repeat CT head 6/13 slightly increased leftward midline shift  measuring 7 mm.  Evaluated by Dr. [name] who felt R ICA occlusion possibly in setting of A. fib with interruption of AC in setting of recent bleed -aspirin 81 mg daily initiated in consideration of initiating DOAC 10 days post stroke.  EF 60 to 65% with severely dilated LA.  LDL 56.  A1c 6.2.  Residual deficits of dysarthria, aphasia, dysphagia (NPO status), and left hemiparesis -discharged back to CIR on 6/11 - 7/14. CIR admission significant for UTI tx'd with Keflex, facial twitching with negative EEG but started on Keppra 500 mg twice daily for seizure prophylaxis, and repeat CT head 6/20 showed decrease edema and Eliquis initiated 6/21.  Progress limited by bouts of lethargy, restlessness and cognitive deficits -family elected SNF for progressive therapy and discharged to [facility] 7/14.   Last visit 10/27/2020, [name] [name] is being seen for hospital follow-up accompanied by his wife.  He continues to reside at [facility] currently receiving PT/OT/SLP for residual left hemiparesis, gait impairment/imbalance, dysphagia, cognitive impairment and speech difficulties.  Wife provides majority of history. She does report improvement since discharge.  He is able to ambulate short distance during therapy sessions only otherwise transfers via [place].  Mental status fluctuates.  Swallowing has improved currently on mechanical soft diet and NTL without difficulty and has good appetite.  He is able to feed himself.  She denies any word finding or slurred speech but more issues with vocal intensity.  Currently being followed by [facility] for retention issues with recent use of Foley catheter but is now currently being straight cathed as he was consistently pulling on catheter tubing. Denies new stroke/TIA  symptoms. Per MAR review, remains on Eliquis and atorvastatin.  Also remains on Keppra 500 mg twice daily. No seizure activity noted.  Blood pressure today 120/75. She questions time frame of repeat  imaging, medications to help with cognition, and expectations of recovery.  No further concerns at this time.   Update 02/03/21 :  He returns for follow-up after last visit 3 months ago.  Is accompanied by his wife.  Patient has been hospitalized twice since then.  He was hospitalized from October 13 at [facility] with dehydration, hyponatremia, urinary tract infection and Enterococcus bacteremia with endocarditis.  MRI scan showed a small cerebellar infarct.  Patient was quite lethargic during hospitalization this was felt to be related to [place] and it was changed to Vimpat.  He was back in the nursing home but returned for an admission from November 19 to 23 for exacerbation of congestive heart failure.  Is found to have pulmonary edema and was diuresed.  He is now back in the nursing home.  He still wheelchair-bound.  Has been going to work with physical therapist but is still not able to walk.  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  He has had no breakthrough seizures.  Patient had developed significant tremors in the extremities as this was felt to be mentating which was started for promoting wakefulness and had reduced the dose to 100 milligrams twice daily which has shown some reduction in his tremors however tremor still persist.  He is back on Eliquis for stroke prevention for his A. fib and is having minor bruising but no bleeding episodes.  Blood pressures well controlled today it is 98/67.  He continues to have significant cognitive impairment following his strokes and wife asks questions about his overall prognosis     Update 08/19/2021 : Patient is seen urgently upon request because of recent hospitalization with breakthrough seizures.  He was admitted on 656/23 with several breakthrough seizures and was setting of a bout of prolonged coughing.  He was given Versed and Keppra in the ER and since then was found to be lethargic and quite  sleepy but no further seizure activity was noted.  EEG on 08/11/2021 showed cortical dysfunction in the right hemisphere but no ongoing seizure activity.  Patient was seen by Dr. Andreas Newport who recommended increasing the Vimpat to 75 twice daily as this with the breakthrough seizure in the setting of an infection as white count is elevated to 16.1 and chest x-ray showed bilateral patchy airspace opacities.[name]  He was felt to have sepsis due to pneumonia he was treated with vancomycin and cefepime on admission and then switch to Unasyn and discharged on amoxicillin with plans for repeat chest x-ray in 4 to 5 weeks.  Wife states patient has not had any further breakthrough seizures but appears to be quite sleepy and sleeps most of the day.  He can wake up a little bit to eat his food and to work with occupational therapy.  She is wondering if this is because of increased dose of Vimpat and whether he needs increased dose and seizures were triggered by infection which has been treated.  Patient is essentially wheelchair-bound and does not get up and walk his left hemiparesis and visual field loss.  He is also not been moving his right leg well since the last encephalopathy episode last fall.  He does follow commands and uses right hand purposefully.  He remains on Eliquis which  is tolerating well without bruising or bleeding.  Blood pressure is under good control today it is 121/72.  He continues to follow-up in the sleep clinic with Dr. Brett Fairy for CPAP for his sleep apnea.    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    Other and unspecified hyperlipidemia    Pre-diabetes    Previous back surgery 1978, jan 2007   Psychosexual dysfunction with inhibited sexual excitement    SOB (shortness of breath)    "with heavy exercise"   Stroke ([place]) 08/2013   "I WAS IN AFIB AND THREW A CLOT, THE EFFECTS WERE TRANSITORY AND DIDNT LAST BUT FOR 30 MINUTES"    Thoracic aortic aneurysm (HCC)     PSH:  Past Surgical History:  Procedure Laterality Date   AORTIC AND MITRAL VALVE REPLACEMENT     09/2004   CARDIOVERSION     3 times from 2004-2006   CARDIOVERSION N/A 09/26/2013   Procedure: CARDIOVERSION;  Surgeon: Larey Dresser, MD;  Location: [place];  Service: Cardiovascular;  Laterality: N/A;   CARDIOVERSION N/A 06/19/2014   Procedure: CARDIOVERSION;  Surgeon: Jerline Pain, MD;  Location: [place];  Service: Cardiovascular;  Laterality: N/A;   CARDIOVERSION N/A 04/24/2017   Procedure: CARDIOVERSION;  Surgeon: Larey Dresser, MD;  Location: [place];  Service: Cardiovascular;  Laterality: N/A;   CARDIOVERSION N/A 06/08/2017   Procedure: CARDIOVERSION;  Surgeon: Pixie Casino, MD;  Location: [facility] ENDOSCOPY;  Service: Cardiovascular;  Laterality: N/A;   CARDIOVERSION N/A 08/24/2017   Procedure: CARDIOVERSION;  Surgeon: Skeet Latch, MD;  Location: [facility] ENDOSCOPY;  Service: Cardiovascular;  Laterality: N/A;   CARDIOVERSION N/A 02/14/2018   Procedure: CARDIOVERSION;  Surgeon: Larey Dresser, MD;  Location: [facility] ENDOSCOPY;  Service: Cardiovascular;  Laterality: N/A;   COLONOSCOPY     CRANIOTOMY N/A 07/28/2020   Procedure: CRANIOTOMY FOR EVACUATION OF SUBDURAL HEMATOMA;  Surgeon: Eustace Blackston, MD;  Location: [place];  Service: Neurosurgery;  Laterality: N/A;   IR ANGIO VERTEBRAL SEL SUBCLAVIAN INNOMINATE UNI R MOD SED  08/07/2020   IR CT HEAD LTD  08/07/2020   IR  PERCUTANEOUS ART THROMBECTOMY/INFUSION INTRACRANIAL INC DIAG ANGIO  08/07/2020   laminectomies     10/1975 and in 03/2005   RADIOLOGY WITH ANESTHESIA N/A 08/07/2020   Procedure: IR WITH ANESTHESIA;  Surgeon: Luanne Bras, MD;  Location: [place];  Service: Radiology;  Laterality: N/A;   TEE WITHOUT CARDIOVERSION N/A 12/18/2020   Procedure: TRANSESOPHAGEAL ECHOCARDIOGRAM (TEE);  Surgeon: Larey Dresser, MD;  Location: [facility] ENDOSCOPY;  Service: Cardiovascular;  Laterality: N/A;   [place] Right 04/03/2018   Procedure: TOTAL HIP ARTHROPLASTY ANTERIOR APPROACH;  Surgeon: Paralee Cancel, MD;  Location: [facility];  Service: Orthopedics;  Laterality: Right;  70 mins  Social History:  Social History   Socioeconomic History   Marital status: Married    Spouse name: [name] [name]   Number of children: 2   Years of education: BS   Highest education level: Not on file  Occupational History   Occupation: retired  Tobacco Use   Smoking status: Never   Smokeless tobacco: Never  Vaping Use   Vaping Use: Never used  Substance and Sexual Activity   Alcohol use: Never    Comment: quit 5/22   Drug use: Never   Sexual activity: Not Currently    Partners: Female  Other Topics Concern   Not on file  Social History Narrative   10/27/20 residing at [facility] care center since 09/17/20    [employer] - BS, JD. married - '67. 2 sons - '74, '77  one son is a Nurse, learning disability for [employer]; 4 grandchildren. 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 marriage in good health. End-of-life: discussed issues and provided packet with the MOST form and out of facility order.      Caffeine: decaf coffee   Social Determinants of Health   Financial Resource Strain: Low Risk  (06/27/2018)   Overall Financial Resource Strain (CARDIA)    Difficulty of Paying Living Expenses: Not hard at all  Food  Insecurity: No Food Insecurity (06/27/2018)   Hunger Vital Sign    Worried About Running Out of Food in the Last Year: Never true    Ran Out of Food in the Last Year: Never true  Transportation Needs: No Transportation Needs (06/27/2018)   PRAPARE - Hydrologist (Medical): No    Lack of Transportation (Non-Medical): No  Physical Activity: Not on file  Stress: Not on file  Social Connections: Not on file  Intimate Partner Violence: Not on file    Family History:  Family History  Problem Relation Age of Onset   Macular degeneration Mother        macular degeneration   Cancer Father        intestinal/GI   Diabetes Paternal Aunt    Heart attack Maternal Grandfather    Diabetes Brother     Medications:   Current Outpatient Medications on File Prior to Visit  Medication Sig Dispense Refill   acetaminophen (TYLENOL) 325 MG tablet Take 650 mg by mouth every 6 (six) hours as needed for mild pain.     amantadine (SYMMETREL) 100 MG capsule Take 100 mg by mouth 2 (two) times daily. Between 8-11 am and 12-3 pm     amLODipine (NORVASC) 5 MG tablet Take 5 mg by mouth daily.     amoxicillin (AMOXIL) 400 MG/5ML suspension Take 6.3 mLs (500 mg total) by mouth 2 (two) times daily. 100 mL 0   apixaban (ELIQUIS) 5 MG TABS tablet Take 1 tablet (5 mg total) by mouth 2 (two) times daily. 60 tablet 0   Artificial Saliva (BIOTENE MOISTURIZING MOUTH MT) Use as directed 2 sprays in the mouth or throat in the morning, at noon, and at bedtime.     atorvastatin (LIPITOR) 80 MG tablet Take 1 tablet (80 mg total) by mouth daily.     bisacodyl (DULCOLAX) 10 MG suppository Place 1 suppository (10 mg total) rectally daily as needed for moderate constipation or mild constipation.     clobetasol (TEMOVATE) 0.05 % external solution Apply 1 application. topically 2 (two) times daily as needed (itching).  diazePAM, 15 MG Dose, (VALTOCO 15 MG DOSE) 2 x 7.5 MG/0.1ML LQPK Place 7.5 mg into  each nostril (total of 15 mg) for grand mal seizure lasting over 5 minutes. Can repeat once in 4 hours. Do Not use more than twice in 24 hours 2 each 1   dofetilide (TIKOSYN) 125 MCG capsule Take 3 capsules (375 mcg total) by mouth 2 (two) times daily.     famotidine (PEPCID) 20 MG tablet Take 20 mg by mouth at bedtime as needed for indigestion.     finasteride (PROSCAR) 5 MG tablet Take 1 tablet (5 mg total) by mouth daily. 30 tablet 0   Glucerna (GLUCERNA) LIQD Take 237 mLs by mouth daily.     hydrocortisone 2.5 % cream Apply 1 application. topically daily as needed (itching).     levalbuterol (XOPENEX) 0.63 MG/3ML nebulizer solution Take 0.63 mg by nebulization every 6 (six) hours as needed for wheezing or shortness of breath.     magnesium gluconate (MAGONATE) 500 MG tablet Take 1 tablet (500 mg total) by mouth daily. 30 tablet 0   methenamine (HIPREX) 1 g tablet Take 1 g by mouth every 12 (twelve) hours.     Multiple Vitamin (MULTIVITAMIN WITH MINERALS) TABS tablet Take 1 tablet by mouth daily.     Polyethyl Glycol-Propyl Glycol (SYSTANE) 0.4-0.3 % SOLN Place 2 drops into both eyes daily.     polyethylene glycol (MIRALAX / GLYCOLAX) 17 g packet Take 17 g by mouth every other day. 14 each    potassium chloride (KLOR-CON M) 10 MEQ tablet Take 1 tablet (10 mEq total) by mouth daily. 30 tablet 0   senna-docusate (SENOKOT-S) 8.6-50 MG tablet Take 1 tablet by mouth at bedtime as needed.     torsemide (DEMADEX) 20 MG tablet Take 20 mg by mouth daily.     No current facility-administered medications on file prior to visit.    Allergies:   Allergies  Allergen Reactions   Naproxen Hives   No [facility] Food Allergies Other (See Comments)    Scallops - distress, nausea and vomitting      OBJECTIVE:  Physical Exam  Vitals:   08/19/21 1437  BP: 121/72  Pulse: 73   There is no height or weight on file to calculate BMI. No results found.  General: Frail malnourished looking, elderly  Caucasian male, seated, in no evident distress.  Sitting in wheelchair Head: head normocephalic and atraumatic.   Neck: supple with no carotid or supraclavicular bruits Cardiovascular: regular rate and rhythm, no murmurs Musculoskeletal: no deformity Skin:  no rash/petichiae Vascular:  Normal pulses all extremities   Neurologic Exam Mental Status: Drowsy but could be aroused to become alert interactive.  Limited verbal output with hypophonia and possible underlying aphasia.  Delayed processing and responses.  Some difficulty following simple commands .  Oriented to self but disoriented to place and time. Recent and remote memory impaired. Attention span, concentration and fund of knowledge impaired. Mood and affect flat.  Cranial Nerves: Fundoscopic exam not done. Pupils equal, briskly reactive to light.  Right gaze preference.  Unable to look to the left past midline..  Left inattention.  Does not blink to threat on left.  Left homonymous hemianopsia hearing intact. Facial sensation intact.  Left lower facial weakness.  Tongue and palate moves normally and symmetrically.  Motor: Spastic left hemiplegia with left upper extremity grade 3/5 strength weakness of left grip and intrinsic hand muscles.  Left lower extremity strength is 2/5.  Tone is  increased on the left with spasticity.  Right upper extremity good strength distally and at the elbow.  Right shoulder movements are limited.  Does not move the right lower extremity well but withdraws to pain. Sensory.:  Preserved bilaterally.   Coordination: Impaired on the left Gait and Station: Deferred Reflexes: 2+ and asymmetric and brisker on the left. Toes downgoing.     NIHSS  13 Modified Rankin  4      ASSESSMENT: [name] [name] is a 81 y.o. year old male with PMHx of atrial fibrillation, aortic valve replacement, mitral valve replacement, hx of stroke (2015, 2/2 a.fib without deficit), HTN, HLD and DM who presented on 5/22 after a fall with  imaging unremarkable.  He was d/c'd and return the following day with garbled speech and difficulty moving with imaging now demonstrating very large R SDH and smaller L SDH with mass defect, subfalcine herniation and leftward midline shift with early left lateral ventricle entrapment.  Placed on comfort care due to poor prognosis but due to improvement, underwent craniotomy for evacuation of right SDH.  Warfarin placed on hold.  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 fib with interruption of AC due to recent traumatic SDH.  Recent hospitalizations in October 2022 due to dehydration, hyponatremia, UTI and bacteremia as well as in November 2022 due to acute congestive heart failure.  Recent hospitalization last week for breakthrough seizures in the setting of infection and he has been quite sleepy since then.     PLAN: I had a long discussion with the patient and his wife regarding his recent breakthrough seizures in the setting of infection.  He has had significant daytime sleepiness since this happened 10 days ago and so recommend we reduce the dose of Vimpat back to 50 mg twice daily since it was a provoked seizure and also to discontinue melatonin.  Consider tapering amantadine slowly as tolerated as I expect his mental status to improve over the next few weeks.  Continue Eliquis for stroke prevention and maintain aggressive risk factor modification strict control of hypertension with blood pressure goal below 140/90, lipids with LDL cholesterol goal below 70 mg percent.  Continue follow-up with Dr. Brett Fairy at the sleep clinic first sleep apnea issues.  Follow-up with me in the future only as necessary.Greater than 50% time during this prolonged 40-minute visit was spent on counseling and coordination of care about his recurrent seizures and excessive sleepiness s and recent hospitalization and  answering questions  [name] Contras, MD [facility] [address] [place], [place] [address]  Phone [phone] Fax [phone] Note: This document was prepared with digital dictation and possible smart phrase technology. Any transcriptional errors that result from this process are unintentional.

## 2021-08-19 NOTE — Telephone Encounter (Signed)
Pt's wife, Goble [place] (on [place]) would like a call from the nurse to discuss if need to continue the CPAP machine tonight.

## 2021-08-19 NOTE — Patient Instructions (Signed)
I had a long discussion with the patient and his wife regarding his recent breakthrough seizures in the setting of infection.  He has had significant daytime sleepiness since this happened 10 days ago and so recommend we reduce the dose of Vimpat back to 50 mg twice daily since it was a provoked seizure and also to discontinue melatonin.  Consider tapering amantadine slowly as tolerated as I expect his mental status to improve over the next few weeks.  Continue Eliquis for stroke prevention and maintain aggressive risk factor modification strict control of hypertension with blood pressure goal below 140/90, lipids with LDL cholesterol goal below 70 mg percent.  Continue follow-up with Dr. Brett Fairy at the sleep clinic first sleep apnea issues.  Follow-up with me in the future only as necessary.

## 2021-08-19 NOTE — Telephone Encounter (Signed)
[facility] Lister) Need prescription sent to the pharmacy because neurologist decreased Vimpat to '50mg'$ . Because is a controlled substance will accept what send over today. Need to send to [facility]

## 2021-08-19 NOTE — Telephone Encounter (Signed)
I will send the updated script to Dr [name] to escribe to the pharmacy for the patient.

## 2021-08-20 DIAGNOSIS — I69354 Hemiplegia and hemiparesis following cerebral infarction affecting left non-dominant side: Secondary | ICD-10-CM | POA: Diagnosis not present

## 2021-08-20 DIAGNOSIS — I69812 Visuospatial deficit and spatial neglect following other cerebrovascular disease: Secondary | ICD-10-CM | POA: Diagnosis not present

## 2021-08-20 DIAGNOSIS — I69391 Dysphagia following cerebral infarction: Secondary | ICD-10-CM | POA: Diagnosis not present

## 2021-08-20 DIAGNOSIS — S065X3S Traumatic subdural hemorrhage with loss of consciousness of 1 hour to 5 hours 59 minutes, sequela: Secondary | ICD-10-CM | POA: Diagnosis not present

## 2021-08-20 DIAGNOSIS — R293 Abnormal posture: Secondary | ICD-10-CM | POA: Diagnosis not present

## 2021-08-20 DIAGNOSIS — H538 Other visual disturbances: Secondary | ICD-10-CM | POA: Diagnosis not present

## 2021-08-20 DIAGNOSIS — G4089 Other seizures: Secondary | ICD-10-CM | POA: Diagnosis not present

## 2021-08-20 DIAGNOSIS — R278 Other lack of coordination: Secondary | ICD-10-CM | POA: Diagnosis not present

## 2021-08-20 DIAGNOSIS — R41840 Attention and concentration deficit: Secondary | ICD-10-CM | POA: Diagnosis not present

## 2021-08-20 DIAGNOSIS — I5022 Chronic systolic (congestive) heart failure: Secondary | ICD-10-CM | POA: Diagnosis not present

## 2021-08-20 DIAGNOSIS — R41841 Cognitive communication deficit: Secondary | ICD-10-CM | POA: Diagnosis not present

## 2021-08-20 DIAGNOSIS — M6389 Disorders of muscle in diseases classified elsewhere, multiple sites: Secondary | ICD-10-CM | POA: Diagnosis not present

## 2021-08-23 DIAGNOSIS — R41841 Cognitive communication deficit: Secondary | ICD-10-CM | POA: Diagnosis not present

## 2021-08-23 DIAGNOSIS — I5022 Chronic systolic (congestive) heart failure: Secondary | ICD-10-CM | POA: Diagnosis not present

## 2021-08-23 DIAGNOSIS — M6389 Disorders of muscle in diseases classified elsewhere, multiple sites: Secondary | ICD-10-CM | POA: Diagnosis not present

## 2021-08-23 DIAGNOSIS — I69812 Visuospatial deficit and spatial neglect following other cerebrovascular disease: Secondary | ICD-10-CM | POA: Diagnosis not present

## 2021-08-23 DIAGNOSIS — R41840 Attention and concentration deficit: Secondary | ICD-10-CM | POA: Diagnosis not present

## 2021-08-23 DIAGNOSIS — G4089 Other seizures: Secondary | ICD-10-CM | POA: Diagnosis not present

## 2021-08-23 DIAGNOSIS — H538 Other visual disturbances: Secondary | ICD-10-CM | POA: Diagnosis not present

## 2021-08-23 DIAGNOSIS — R278 Other lack of coordination: Secondary | ICD-10-CM | POA: Diagnosis not present

## 2021-08-23 DIAGNOSIS — R293 Abnormal posture: Secondary | ICD-10-CM | POA: Diagnosis not present

## 2021-08-23 DIAGNOSIS — I38 Endocarditis, valve unspecified: Secondary | ICD-10-CM | POA: Diagnosis not present

## 2021-08-23 DIAGNOSIS — S065X3S Traumatic subdural hemorrhage with loss of consciousness of 1 hour to 5 hours 59 minutes, sequela: Secondary | ICD-10-CM | POA: Diagnosis not present

## 2021-08-23 DIAGNOSIS — I69354 Hemiplegia and hemiparesis following cerebral infarction affecting left non-dominant side: Secondary | ICD-10-CM | POA: Diagnosis not present

## 2021-08-23 DIAGNOSIS — I69391 Dysphagia following cerebral infarction: Secondary | ICD-10-CM | POA: Diagnosis not present

## 2021-08-23 LAB — BASIC METABOLIC PANEL WITH GFR
BUN: 25 — AB (ref 4–21)
CO2: 27 — AB (ref 13–22)
Chloride: 110 — AB (ref 99–108)
Creatinine: 0.8 (ref 0.6–1.3)
Glucose: 101
Potassium: 4.3 meq/L (ref 3.5–5.1)
Sodium: 148 — AB (ref 137–147)

## 2021-08-23 LAB — COMPREHENSIVE METABOLIC PANEL: Calcium: 9.4 (ref 8.7–10.7)

## 2021-08-24 ENCOUNTER — Encounter: Payer: Self-pay | Admitting: Orthopedic Surgery

## 2021-08-24 ENCOUNTER — Non-Acute Institutional Stay (SKILLED_NURSING_FACILITY): Payer: Medicare Other | Admitting: Orthopedic Surgery

## 2021-08-24 DIAGNOSIS — I69354 Hemiplegia and hemiparesis following cerebral infarction affecting left non-dominant side: Secondary | ICD-10-CM | POA: Diagnosis not present

## 2021-08-24 DIAGNOSIS — M6389 Disorders of muscle in diseases classified elsewhere, multiple sites: Secondary | ICD-10-CM | POA: Diagnosis not present

## 2021-08-24 DIAGNOSIS — I69391 Dysphagia following cerebral infarction: Secondary | ICD-10-CM | POA: Diagnosis not present

## 2021-08-24 DIAGNOSIS — R278 Other lack of coordination: Secondary | ICD-10-CM | POA: Diagnosis not present

## 2021-08-24 DIAGNOSIS — E870 Hyperosmolality and hypernatremia: Secondary | ICD-10-CM | POA: Diagnosis not present

## 2021-08-24 DIAGNOSIS — T826XXD Infection and inflammatory reaction due to cardiac valve prosthesis, subsequent encounter: Secondary | ICD-10-CM | POA: Diagnosis not present

## 2021-08-24 DIAGNOSIS — R569 Unspecified convulsions: Secondary | ICD-10-CM

## 2021-08-24 DIAGNOSIS — G4089 Other seizures: Secondary | ICD-10-CM | POA: Diagnosis not present

## 2021-08-24 DIAGNOSIS — I69812 Visuospatial deficit and spatial neglect following other cerebrovascular disease: Secondary | ICD-10-CM | POA: Diagnosis not present

## 2021-08-24 DIAGNOSIS — R293 Abnormal posture: Secondary | ICD-10-CM | POA: Diagnosis not present

## 2021-08-24 DIAGNOSIS — G4733 Obstructive sleep apnea (adult) (pediatric): Secondary | ICD-10-CM | POA: Diagnosis not present

## 2021-08-24 DIAGNOSIS — S065X3S Traumatic subdural hemorrhage with loss of consciousness of 1 hour to 5 hours 59 minutes, sequela: Secondary | ICD-10-CM | POA: Diagnosis not present

## 2021-08-24 DIAGNOSIS — R41841 Cognitive communication deficit: Secondary | ICD-10-CM | POA: Diagnosis not present

## 2021-08-24 DIAGNOSIS — I38 Endocarditis, valve unspecified: Secondary | ICD-10-CM

## 2021-08-24 DIAGNOSIS — H538 Other visual disturbances: Secondary | ICD-10-CM | POA: Diagnosis not present

## 2021-08-24 DIAGNOSIS — J189 Pneumonia, unspecified organism: Secondary | ICD-10-CM

## 2021-08-24 DIAGNOSIS — R41840 Attention and concentration deficit: Secondary | ICD-10-CM | POA: Diagnosis not present

## 2021-08-24 DIAGNOSIS — I5022 Chronic systolic (congestive) heart failure: Secondary | ICD-10-CM | POA: Diagnosis not present

## 2021-08-24 NOTE — Progress Notes (Signed)
Location:  [place] Room Number: 127/A Place of Service:  SNF [id number]) Provider: Yvonna Alanis, NP  Patient Care Team: [name], MD as PCP - General (Internal Medicine) Larey Dresser, MD as PCP - Cardiology (Cardiology) [name], MD as Consulting Physician (Ophthalmology) [name], MD (Internal Medicine)  Extended Emergency Contact Information Primary Emergency Contact: [name] Address: [address]          [place], [place] [address] [place] Phone: [phone] Mobile Phone: [phone] Relation: Spouse Secondary Emergency Contact: [name], [place] [address] [name] [place] Phone: [phone] Relation: Son  Code Status:  DNR Goals of care: Advanced Directive information    08/24/2021   10:09 AM  Advanced Directives  Does Patient Have a Medical Advance Directive? Yes  Type of Paramedic of [place];Living will;Out of facility DNR (pink MOST or yellow form)  Does patient want to make changes to medical advance directive? No - Patient declined  Copy of [place] in Chart? Yes - validated most recent copy scanned in chart (See row information)     Chief Complaint  Patient presents with   Acute Visit    Hypernatremia    HPI:  Pt is a 81 y.o. male seen today for an acute visit for hypernatremia.   Recently hospitalized 06/06-06/08 due to seizure like activity. Since return to SNF, he goes through periods of sleeping more. He was recently diagnosed with sleep apnea and is using CPAP. Routine labs revealed Na+ 148 08/23/2021. Treatment options discussed with wife.   No recent seizures. Vimpat increased last hospitalization.   Finished Augmentin for recent pneumonia. Repeat CXR scheduled for 09/09/2021. No cough at this time. Vitals stable.   Remains on daily amoxicillin due to endocarditis.     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    Other and unspecified hyperlipidemia    Pre-diabetes    Previous back surgery 1978, jan 2007   Psychosexual dysfunction with inhibited sexual excitement    SOB (shortness of breath)    "with heavy exercise"   Stroke ([place]) 08/2013   "I WAS IN AFIB AND THREW A CLOT, THE EFFECTS WERE TRANSITORY AND DIDNT LAST BUT FOR 30 MINUTES"    Thoracic aortic aneurysm [facility])    Past Surgical History:  Procedure Laterality Date   AORTIC AND MITRAL VALVE REPLACEMENT     09/2004   CARDIOVERSION     3 times from 2004-2006   CARDIOVERSION N/A 09/26/2013   Procedure: CARDIOVERSION;  Surgeon: Larey Dresser, MD;  Location: [place];  Service: Cardiovascular;  Laterality: N/A;   CARDIOVERSION N/A 06/19/2014   Procedure: CARDIOVERSION;  Surgeon: Jerline Pain, MD;  Location: [place];  Service: Cardiovascular;  Laterality: N/A;   CARDIOVERSION N/A  04/24/2017   Procedure: CARDIOVERSION;  Surgeon: Larey Dresser, MD;  Location: [facility] ENDOSCOPY;  Service: Cardiovascular;  Laterality: N/A;   CARDIOVERSION N/A 06/08/2017   Procedure: CARDIOVERSION;  Surgeon: Pixie Casino, MD;  Location: [facility] ENDOSCOPY;  Service: Cardiovascular;  Laterality: N/A;   CARDIOVERSION N/A 08/24/2017   Procedure: CARDIOVERSION;  Surgeon: Skeet Latch, MD;  Location: [facility] ENDOSCOPY;  Service: Cardiovascular;  Laterality: N/A;   CARDIOVERSION N/A 02/14/2018   Procedure: CARDIOVERSION;  Surgeon: Larey Dresser,  MD;  Location: [facility] ENDOSCOPY;  Service: Cardiovascular;  Laterality: N/A;   COLONOSCOPY     CRANIOTOMY N/A 07/28/2020   Procedure: CRANIOTOMY FOR EVACUATION OF SUBDURAL HEMATOMA;  Surgeon: Eustace Mazzie, MD;  Location: [place];  Service: Neurosurgery;  Laterality: N/A;   IR ANGIO VERTEBRAL SEL SUBCLAVIAN INNOMINATE UNI R MOD SED  08/07/2020   IR CT HEAD LTD  08/07/2020   IR PERCUTANEOUS ART THROMBECTOMY/INFUSION INTRACRANIAL INC DIAG ANGIO  08/07/2020   laminectomies     10/1975 and in 03/2005   RADIOLOGY WITH ANESTHESIA N/A 08/07/2020   Procedure: IR WITH ANESTHESIA;  Surgeon: Luanne Bras, MD;  Location: [place];  Service: Radiology;  Laterality: N/A;   TEE WITHOUT CARDIOVERSION N/A 12/18/2020   Procedure: TRANSESOPHAGEAL ECHOCARDIOGRAM (TEE);  Surgeon: Larey Dresser, MD;  Location: [facility] ENDOSCOPY;  Service: Cardiovascular;  Laterality: N/A;   [place] Right 04/03/2018   Procedure: TOTAL HIP ARTHROPLASTY ANTERIOR APPROACH;  Surgeon: Paralee Cancel, MD;  Location: [facility] ORS;  Service: Orthopedics;  Laterality: Right;  70 mins    Allergies  Allergen Reactions   Naproxen Hives   No [facility] Food Allergies Other (See Comments)    S[place] - distress, nausea and vomitting    Outpatient Encounter Medications as of 08/24/2021  Medication Sig   acetaminophen (TYLENOL) 325 MG tablet Take 650 mg by mouth as needed for mild pain.   amantadine (SYMMETREL) 100 MG capsule Take 100 mg by mouth in the morning and at bedtime. Give one tab with breakfast and lunch   amLODipine (NORVASC) 5 MG tablet Take 5 mg by mouth daily.   amoxicillin (AMOXIL) 400 MG/5ML suspension Take 6.3 mLs (500 mg total) by mouth 2 (two) times daily.   apixaban (ELIQUIS) 5 MG TABS tablet Take 1 tablet (5 mg total) by mouth 2 (two) times daily.   Artificial Saliva (BIOTENE MOISTURIZING MOUTH MT) Use as directed 2 sprays in the mouth or throat in the morning, at noon, and at bedtime.    atorvastatin (LIPITOR) 80 MG tablet Take 1 tablet (80 mg total) by mouth daily.   bisacodyl (DULCOLAX) 10 MG suppository Place 1 suppository (10 mg total) rectally daily as needed for moderate constipation or mild constipation.   clobetasol (TEMOVATE) 0.05 % external solution Apply 1 application  topically as needed (itching).   diazePAM, 15 MG Dose, (VALTOCO 15 MG DOSE) 2 x 7.5 MG/0.1ML LQPK Place 7.5 mg into each nostril (total of 15 mg) for grand mal seizure lasting over 5 minutes. Can repeat once in 4 hours. Do Not use more than twice in 24 hours   dofetilide (TIKOSYN) 125 MCG capsule Take 3 capsules (375 mcg total) by mouth 2 (two) times daily.   famotidine (PEPCID) 20 MG tablet Take 20 mg by mouth at bedtime as needed for indigestion.   finasteride (PROSCAR) 5 MG tablet Take 1 tablet (5 mg total) by mouth daily.   hydrocortisone 2.5 % cream Apply 1  application. topically daily as needed (itching).   lacosamide (VIMPAT) 50 MG TABS tablet Take 1 tablet (50 mg total) by mouth 2 (two) times daily.   levalbuterol (XOPENEX) 0.63 MG/3ML nebulizer solution Take 0.63 mg by nebulization every 6 (six) hours as needed for wheezing or shortness of breath.   loratadine (CLARITIN) 10 MG tablet Take 10 mg by mouth daily.   magnesium gluconate (MAGONATE) 500 MG tablet Take 1 tablet (500 mg total) by mouth daily.   methenamine (HIPREX) 1 g tablet Take 1 g by mouth every 12 (twelve) hours.   Multiple Vitamin (MULTIVITAMIN WITH MINERALS) TABS tablet Take 1 tablet by mouth daily.   nystatin (MYCOSTATIN/NYSTOP) powder Apply 1 Application topically 2 (two) times daily.   Polyethyl Glycol-Propyl Glycol (SYSTANE) 0.4-0.3 % SOLN Place 2 drops into both eyes daily.   polyethylene glycol (MIRALAX / GLYCOLAX) 17 g packet Take 17 g by mouth every other day.   potassium chloride (KLOR-CON M) 10 MEQ tablet Take 1 tablet (10 mEq total) by mouth daily.   senna-docusate (SENOKOT-S) 8.6-50 MG tablet Take 1 tablet by mouth at  bedtime as needed.   torsemide (DEMADEX) 20 MG tablet Take 20 mg by mouth as needed.   [DISCONTINUED] Glucerna (GLUCERNA) LIQD Take 237 mLs by mouth daily.   No facility-administered encounter medications on file as of 08/24/2021.    Review of Systems  Unable to perform ROS: Patient nonverbal    Immunization History  Administered Date(s) Administered   Fluad Quad(high Dose 65+) 12/06/2018, 12/09/2020   Influenza Whole 12/25/2008, 01/05/2010, 12/30/2011   Influenza, High Dose Seasonal PF 12/14/2016   Influenza,inj,Quad PF,6+ Mos 12/19/2013   Influenza,inj,quad, With Preservative 11/22/2017   Influenza-Unspecified 12/14/2015, 12/24/2019   Moderna Covid-19 Vaccine Bivalent Booster 47yr & up 01/12/2021   Moderna SARS-COV2 Booster Vaccination 01/02/2020, 06/26/2020   Moderna Sars-Covid-2 Vaccination 04/08/2019, 05/05/2019   Pneumococcal Conjugate-13 12/19/2013   Pneumococcal Polysaccharide-23 09/24/2007, 05/22/2017   Td 12/25/2008   Tdap 03/22/2018   Zoster Recombinat (Shingrix) 07/16/2019, 10/28/2019   Zoster, Live 09/24/2007   Pertinent  Health Maintenance Due  Topic Date Due   FOOT EXAM  Never done   OPHTHALMOLOGY EXAM  Never done   URINE MICROALBUMIN  Never done   INFLUENZA VACCINE  10/05/2021   HEMOGLOBIN A1C  01/05/2022   COLONOSCOPY (Pts 45-419yrInsurance coverage will need to be confirmed)  Discontinued      08/10/2021    9:36 PM 08/10/2021   11:10 PM 08/11/2021    8:00 AM 08/11/2021    7:30 PM 08/12/2021    8:00 AM  Fall Risk  Patient Fall Risk Level High fall risk High fall risk High fall risk High fall risk High fall risk   Functional Status Survey:    Vitals:   08/24/21 0949  BP: 112/73  Pulse: 82  Resp: 14  Temp: 98.4 F (36.9 C)  SpO2: 97%  Weight: 145 lb (65.8 kg)  Height: '5\' 7"'$  (1.702 m)   Body mass index is 22.71 kg/m. Physical Exam Vitals reviewed.  Constitutional:      General: He is not in acute distress. HENT:     Head: Normocephalic.   Eyes:     General:        Right eye: No discharge.        Left eye: No discharge.  Cardiovascular:     Rate and Rhythm: Normal rate. Rhythm irregular.     Pulses: Normal pulses.     Heart sounds: Murmur heard.  Pulmonary:     Effort: Pulmonary effort is normal. No respiratory distress.     Breath sounds: Normal breath sounds. No wheezing or rales.  Abdominal:     General: Bowel sounds are normal. There is no distension.     Palpations: Abdomen is soft.     Tenderness: There is no abdominal tenderness.  Musculoskeletal:     Cervical back: Neck supple.     Right lower leg: No edema.     Left lower leg: No edema.  Skin:    General: Skin is warm and dry.     Capillary Refill: Capillary refill takes less than 2 seconds.  Neurological:     General: No focal deficit present.     Mental Status: He is easily aroused. Mental status is at baseline.     Motor: Weakness present.     Gait: Gait abnormal.     Comments: Left sided weakness, wheelchair  Psychiatric:        Mood and Affect: Mood normal.        Behavior: Behavior normal.     Labs reviewed: Recent Labs    12/18/20 1630 12/19/20 0610 12/19/20 1632 12/20/20 0153 02/08/21 0000 02/09/21 0000 08/10/21 0937 08/10/21 0957 08/11/21 0408 08/12/21 0107 08/16/21 0000 08/23/21 0000  NA  --  139  --    < > 150*   < > 141   < > 140 142 140 148*  K  --  3.5  --    < > 3.7   < > 3.8   < > 3.6 3.9 4.2 4.3  CL  --  108  --    < > 107   < > 111  --  109 113* 107 110*  CO2  --  23  --    < > 22   < > 16*  --  '24 23 21 '$ 27*  GLUCOSE  --  186*  --    < >  --    < > 148*  --  120* 143*  --   --   BUN  --  16  --    < > 23*   < > 22  --  '17 17 19 '$ 25*  CREATININE  --  0.76  --    < > 0.9   < > 1.18  --  0.81 0.83 0.6 0.8  CALCIUM  --  7.7*  --    < > 9.9   < > 8.8*  --  8.6* 8.7* 8.8 9.4  MG 1.9 2.0 1.9   < > 2.1  --   --   --  1.9 2.0  --   --   PHOS 3.1 2.7 2.4*  --   --   --   --   --   --   --   --   --    < > = values in this  interval not displayed.   Recent Labs    04/19/21 0000 05/11/21 1132 07/12/21 1056 08/10/21 0937 08/11/21 0408  AST 23   < > '22 29 27  '$ ALT 20   < > '22 26 23  '$ ALKPHOS 175*  --   --  93 80  BILITOT  --    < > 0.7 0.5 0.8  PROT  --    < > 7.2 6.4* 5.8*  ALBUMIN 3.6  --   --  3.3* 3.1*   < > = values in this interval not displayed.  Recent Labs    07/12/21 1056 08/10/21 0937 08/10/21 0957 08/11/21 0408 08/12/21 0107  WBC 9.0 16.1*  --  9.1 10.2  NEUTROABS 6,804 14.3*  --   --  7.8*  HGB 10.9* 10.0* 9.9* 9.4* 9.4*  HCT 33.9* 31.6* 29.0* 28.7* 29.8*  MCV 90.4 96.3  --  92.0 94.3  PLT 426* 238  --  218 227   Lab Results  Component Value Date   TSH 1.773 01/24/2021   Lab Results  Component Value Date   HGBA1C 5.7 07/05/2021   Lab Results  Component Value Date   CHOL 110 07/05/2021   HDL 43 07/05/2021   LDLCALC 57 07/05/2021   TRIG 51 07/05/2021   CHOLHDL 2.8 08/08/2020    Significant Diagnostic Results in last 30 days:  EEG adult  Result Date: 08/10/2021 Lora Havens, MD     08/10/2021  5:38 PM Patient Name: [name] [name] MRN: [id number] Epilepsy Attending: Lora Havens Referring Physician/Provider: Norval Morton, MD Date: 08/10/2021 Duration: 24.01 mins Patient history: 81 year old male with breakthrough seizure in the setting of possible bronchitis or pneumonia. EEG to evaluate for seizure. Level of alertness:  lethargic AEDs during EEG study: LCM Technical aspects: This EEG study was done with scalp electrodes positioned according to the 10-20 International system of electrode placement. Electrical activity was acquired at a sampling rate of '500Hz'$  and reviewed with a high frequency filter of '70Hz'$  and a low frequency filter of '1Hz'$ . EEG data were recorded continuously and digitally stored. Description: No clear posterior dominant rhythm was seen. EEG showed continuous 5 to 8 Hz theta-alpha activity in left hemisphere. There is low amplitude 2-'3hz'$  delta slowing in  right hemisphere.   Hyperventilation and photic stimulation were not performed.   ABNORMALITY - Continuous slow, generalized and lateralized right hemisphere IMPRESSION: This study is suggestive of cortical dysfunction arising from right hemisphere likely secondary to underlying structural abnormality. Additionally there is moderate diffuse encephalopathy, nonspecific etiology. No seizures or epileptiform discharges were seen throughout the recording. Lora Havens   CT Head Wo Contrast  Result Date: 08/10/2021 CLINICAL DATA:  Mental status changes. EXAM: CT HEAD WITHOUT CONTRAST TECHNIQUE: Contiguous axial images were obtained from the base of the skull through the vertex without intravenous contrast. RADIATION DOSE REDUCTION: This exam was performed according to the departmental dose-optimization program which includes automated exposure control, adjustment of the mA and/or kV according to patient size and/or use of iterative reconstruction technique. COMPARISON:  01/23/2021.  12/14/2020. FINDINGS: Brain: There is no evidence for acute hemorrhage, hydrocephalus, mass lesion, or abnormal extra-axial fluid collection. No definite CT evidence for acute infarction. Diffuse loss of parenchymal volume is consistent with atrophy. Patchy low attenuation in the deep hemispheric and periventricular white matter is nonspecific, but likely reflects chronic microvascular ischemic demyelination. Global ventriculomegaly likely a combination of atrophy and previous infarcts. Stable appearance right frontotemporal parietal encephalomalacia consistent with MCA territory infarct. Encephalomalacia in the left parietal lobe is stable. Vascular: No hyperdense vessel or unexpected calcification. Skull: Right craniotomy defect again noted. Sinuses/Orbits: The visualized paranasal sinuses and mastoid air cells are clear. Visualized portions of the globes and intraorbital fat are unremarkable. Other: None. IMPRESSION: 1. No acute  intracranial abnormality. 2. 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 3. Atrophy with chronic small vessel white matter ischemic disease. Electronically Signed   By: Misty Stanley M.D.   On: 08/10/2021 11:44   DG Chest Port 1 View  Result Date: 08/10/2021 CLINICAL  DATA:  Altered mental status. EXAM: PORTABLE CHEST 1 VIEW COMPARISON:  January 23, 2021. FINDINGS: Patchy bilateral airspace opacities are mildly improved. Visible pleural effusions or pneumothorax. Similar cardiomediastinal silhouette. Cardiac valve replacements. Median sternotomy. No evidence of acute osseous abnormality. IMPRESSION: Patchy bilateral airspace opacities are mildly improved. Electronically Signed   By: Margaretha Sheffield M.D.   On: 08/10/2021 10:16    Assessment/Plan 1. Hypernatremia - Na+ 148 08/23/2021 - suspect due to increased sleepiness - Start D5W @ 75cc/hr x 500cc - recheck bmp 06/22 - encourage oral hydration when awake  2. Obstructive sleep apnea syndrome - diagnosed last hospitalization - continues to have increased periods of sleepiness- on amantadine - cont CPAP qhs  3. Seizure [facility]) - followed by neurology - hospitalized 06/06-06/08 due to seizure like activity - CT head negative for acute abnormalities - EEG negative for seizures - Vimpat increased   4. Pneumonia of both lower lobes due to infectious organism - completed Augmentin - repeat CXR scheduled for 07/06  5. Prosthetic valve endocarditis, subsequent encounter - cont Amoxicillin  6. 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

## 2021-08-25 ENCOUNTER — Telehealth: Payer: Self-pay | Admitting: Neurology

## 2021-08-25 DIAGNOSIS — G4089 Other seizures: Secondary | ICD-10-CM | POA: Diagnosis not present

## 2021-08-25 DIAGNOSIS — I69391 Dysphagia following cerebral infarction: Secondary | ICD-10-CM | POA: Diagnosis not present

## 2021-08-25 DIAGNOSIS — R278 Other lack of coordination: Secondary | ICD-10-CM | POA: Diagnosis not present

## 2021-08-25 DIAGNOSIS — R41840 Attention and concentration deficit: Secondary | ICD-10-CM | POA: Diagnosis not present

## 2021-08-25 DIAGNOSIS — R41841 Cognitive communication deficit: Secondary | ICD-10-CM | POA: Diagnosis not present

## 2021-08-25 DIAGNOSIS — I69812 Visuospatial deficit and spatial neglect following other cerebrovascular disease: Secondary | ICD-10-CM | POA: Diagnosis not present

## 2021-08-25 DIAGNOSIS — S065X3S Traumatic subdural hemorrhage with loss of consciousness of 1 hour to 5 hours 59 minutes, sequela: Secondary | ICD-10-CM | POA: Diagnosis not present

## 2021-08-25 DIAGNOSIS — H538 Other visual disturbances: Secondary | ICD-10-CM | POA: Diagnosis not present

## 2021-08-25 DIAGNOSIS — I5022 Chronic systolic (congestive) heart failure: Secondary | ICD-10-CM | POA: Diagnosis not present

## 2021-08-25 DIAGNOSIS — M6389 Disorders of muscle in diseases classified elsewhere, multiple sites: Secondary | ICD-10-CM | POA: Diagnosis not present

## 2021-08-25 DIAGNOSIS — I69354 Hemiplegia and hemiparesis following cerebral infarction affecting left non-dominant side: Secondary | ICD-10-CM | POA: Diagnosis not present

## 2021-08-25 DIAGNOSIS — R293 Abnormal posture: Secondary | ICD-10-CM | POA: Diagnosis not present

## 2021-08-25 NOTE — Telephone Encounter (Signed)
Called the wife and advised I was able to discuss with [facility]. Per [facility] they state that if we completed the compliance visit and indicate that he has failed CPAP and its not because of the masks not fitting correctly then we can order a auto BiPAP and get that covered. The visit must document that he is compliant with CPAP but has continued to have elevated AHI of 18.8 with 11.7 of those being central events. He has already had mask refit completed and has no leaks with the masks.  Due to medical reasons we are unable to get him in for a titration study in our lab. [facility] states that we can order a auto BiPAP and should be able to get this covered based off this documentation.   Rather than waiting until July to discuss this, I offered a sooner apt with Janett Billow. I placed a spot on hold for 6/22 at 10:15 and Monday 6/26 at 3:15 pm. This visit can be a mychart video visit which is what I recommended to them since it is hard to get the patient out. The wife is reaching out to their son to see about setting this up and which time would work better. She will call back and let us know.

## 2021-08-25 NOTE — Telephone Encounter (Signed)
..   Pt understands that although there may be some limitations with this type of visit, we will take all precautions to reduce any security or privacy concerns.  Pt understands that this will be treated like an in office visit and we will file with pt's insurance, and there may be a patient responsible charge related to this service. ? ?

## 2021-08-26 ENCOUNTER — Other Ambulatory Visit: Payer: Self-pay | Admitting: Neurology

## 2021-08-26 ENCOUNTER — Encounter: Payer: Self-pay | Admitting: Adult Health

## 2021-08-26 ENCOUNTER — Telehealth (INDEPENDENT_AMBULATORY_CARE_PROVIDER_SITE_OTHER): Payer: Medicare Other | Admitting: Adult Health

## 2021-08-26 ENCOUNTER — Non-Acute Institutional Stay (SKILLED_NURSING_FACILITY): Payer: Medicare Other | Admitting: Adult Health

## 2021-08-26 DIAGNOSIS — G4089 Other seizures: Secondary | ICD-10-CM | POA: Diagnosis not present

## 2021-08-26 DIAGNOSIS — I69812 Visuospatial deficit and spatial neglect following other cerebrovascular disease: Secondary | ICD-10-CM | POA: Diagnosis not present

## 2021-08-26 DIAGNOSIS — I38 Endocarditis, valve unspecified: Secondary | ICD-10-CM | POA: Diagnosis not present

## 2021-08-26 DIAGNOSIS — H538 Other visual disturbances: Secondary | ICD-10-CM | POA: Diagnosis not present

## 2021-08-26 DIAGNOSIS — E870 Hyperosmolality and hypernatremia: Secondary | ICD-10-CM | POA: Diagnosis not present

## 2021-08-26 DIAGNOSIS — R41841 Cognitive communication deficit: Secondary | ICD-10-CM | POA: Diagnosis not present

## 2021-08-26 DIAGNOSIS — G4730 Sleep apnea, unspecified: Secondary | ICD-10-CM

## 2021-08-26 DIAGNOSIS — R278 Other lack of coordination: Secondary | ICD-10-CM | POA: Diagnosis not present

## 2021-08-26 DIAGNOSIS — I5033 Acute on chronic diastolic (congestive) heart failure: Secondary | ICD-10-CM

## 2021-08-26 DIAGNOSIS — R293 Abnormal posture: Secondary | ICD-10-CM | POA: Diagnosis not present

## 2021-08-26 DIAGNOSIS — I69354 Hemiplegia and hemiparesis following cerebral infarction affecting left non-dominant side: Secondary | ICD-10-CM | POA: Diagnosis not present

## 2021-08-26 DIAGNOSIS — R41840 Attention and concentration deficit: Secondary | ICD-10-CM | POA: Diagnosis not present

## 2021-08-26 DIAGNOSIS — S065X3S Traumatic subdural hemorrhage with loss of consciousness of 1 hour to 5 hours 59 minutes, sequela: Secondary | ICD-10-CM | POA: Diagnosis not present

## 2021-08-26 DIAGNOSIS — R0681 Apnea, not elsewhere classified: Secondary | ICD-10-CM

## 2021-08-26 DIAGNOSIS — M6389 Disorders of muscle in diseases classified elsewhere, multiple sites: Secondary | ICD-10-CM | POA: Diagnosis not present

## 2021-08-26 DIAGNOSIS — I69391 Dysphagia following cerebral infarction: Secondary | ICD-10-CM | POA: Diagnosis not present

## 2021-08-26 DIAGNOSIS — I5022 Chronic systolic (congestive) heart failure: Secondary | ICD-10-CM | POA: Diagnosis not present

## 2021-08-26 LAB — BASIC METABOLIC PANEL
BUN: 27 — AB (ref 4–21)
CO2: 24 — AB (ref 13–22)
Chloride: 106 (ref 99–108)
Creatinine: 0.6 (ref 0.6–1.3)
Glucose: 96
Potassium: 4 mEq/L (ref 3.5–5.1)
Sodium: 140 (ref 137–147)

## 2021-08-26 LAB — COMPREHENSIVE METABOLIC PANEL: Calcium: 9.2 (ref 8.7–10.7)

## 2021-08-26 NOTE — Progress Notes (Signed)
Location:  [facility] Room Number: 127/A Place of Service:  SNF [phone]) Provider: Royal Hawthorn, NP  Patient Care Team: [name], MD as PCP - General (Internal Medicine) Larey Dresser, MD as PCP - Cardiology (Cardiology) [name], MD as Consulting Physician (Ophthalmology) [name], MD (Internal Medicine)  Extended Emergency Contact Information Primary Emergency Contact: [name] Address: [address]          [place], [place] [address] [place] Phone: [phone] Mobile Phone: [phone] Relation: Spouse Secondary Emergency Contact: [name], [place] [address] [name] of [place] Phone: [phone] Relation: Son  Code Status:  DNR Goals of care: Advanced Directive information    08/26/2021    4:02 PM  Advanced Directives  Does Patient Have a Medical Advance Directive? Yes  Type of Paramedic of [place];Living will;Out of facility DNR (pink MOST or yellow form)  Does patient want to make changes to medical advance directive? No - Patient declined  Copy of [place] in Chart? Yes - validated most recent copy scanned in chart (See row information)     Chief Complaint  Patient presents with   Acute Visit    Cough and SOB    HPI:  Pt is a 81 y.o. male seen today for an acute visit for cough and sob.   PMH includes syncope with fall in May of 2022, TBI with SDH with craniotomy, acute right MCA s/p thrombectomy, bioprosthetic mitral and aortic valve replacement, DM II, diastiolic CHF, BPH, and afib.   Symptoms present for 1 day. Nurse reports cough and sob. Wife at the beside says its more just sob. No cough or phlegm production. No fever. No choking episodes Several days ago he was given torsemide for a 3 lb weight gain. Also has periods of lethargy and decreased intake due to TBI. Later when labs were drawn  routine on 6/19 his NA was 148.  Na now 140 6/22.  He was given D5W 500cc 6/20.  As of today he appears to have some shortness of breath and mild ankle edema.   He was in the hospital on 6/6 due to a seizure. Found to have aspiration pna which may have provoked the seizure.  He was treated with Lucianne Lei and cefepime and the transitioned to augmentin. 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   Wt Readings from Last 3 Encounters:  08/26/21 144 lb 3.2 oz (65.4 kg)  08/24/21 145 lb (65.8 kg)  08/16/21 145 lb 9.6 oz (66 kg)  Weight is down 1 lb from 2 days ago.    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    Other and unspecified hyperlipidemia    Pre-diabetes    Previous back surgery 1978, jan 2007  Psychosexual dysfunction with inhibited sexual excitement    SOB (shortness of breath)    "with heavy exercise"   Stroke ([place]) 08/2013   "I WAS IN AFIB AND THREW A CLOT, THE EFFECTS WERE TRANSITORY AND DIDNT LAST BUT FOR 30 MINUTES"    Thoracic aortic aneurysm [facility])    Past Surgical History:  Procedure Laterality Date   AORTIC AND MITRAL VALVE REPLACEMENT     09/2004   CARDIOVERSION     3 times from 2004-2006   CARDIOVERSION N/A 09/26/2013   Procedure: CARDIOVERSION;  Surgeon: Larey Dresser, MD;  Location: [facility];  Service: Cardiovascular;  Laterality: N/A;   CARDIOVERSION N/A 06/19/2014   Procedure: CARDIOVERSION;  Surgeon: Jerline Pain, MD;  Location: [facility];  Service:  Cardiovascular;  Laterality: N/A;   CARDIOVERSION N/A 04/24/2017   Procedure: CARDIOVERSION;  Surgeon: Larey Dresser, MD;  Location: [facility] ENDOSCOPY;  Service: Cardiovascular;  Laterality: N/A;   CARDIOVERSION N/A 06/08/2017   Procedure: CARDIOVERSION;  Surgeon: Pixie Casino, MD;  Location: [facility] ENDOSCOPY;  Service: Cardiovascular;  Laterality: N/A;   CARDIOVERSION N/A 08/24/2017   Procedure: CARDIOVERSION;  Surgeon: Skeet Latch, MD;  Location: [facility] ENDOSCOPY;  Service: Cardiovascular;  Laterality: N/A;   CARDIOVERSION N/A 02/14/2018   Procedure: CARDIOVERSION;  Surgeon: Larey Dresser, MD;  Location: [facility] ENDOSCOPY;  Service: Cardiovascular;  Laterality: N/A;   COLONOSCOPY     CRANIOTOMY N/A 07/28/2020   Procedure: CRANIOTOMY FOR EVACUATION OF SUBDURAL HEMATOMA;  Surgeon: Eustace Billiot, MD;  Location: [facility];  Service: Neurosurgery;  Laterality: N/A;   IR ANGIO VERTEBRAL SEL SUBCLAVIAN INNOMINATE UNI R MOD SED  08/07/2020   IR CT HEAD LTD  08/07/2020   IR PERCUTANEOUS ART THROMBECTOMY/INFUSION INTRACRANIAL INC DIAG ANGIO  08/07/2020   laminectomies     10/1975 and in 03/2005   RADIOLOGY WITH ANESTHESIA N/A 08/07/2020   Procedure: IR WITH ANESTHESIA;  Surgeon: Luanne Bras, MD;  Location: [facility];  Service: Radiology;  Laterality: N/A;   TEE WITHOUT CARDIOVERSION N/A 12/18/2020   Procedure: TRANSESOPHAGEAL ECHOCARDIOGRAM (TEE);  Surgeon: Larey Dresser, MD;  Location: [facility] ENDOSCOPY;  Service: Cardiovascular;  Laterality: N/A;   [place] Right 04/03/2018   Procedure: TOTAL HIP ARTHROPLASTY ANTERIOR APPROACH;  Surgeon: Paralee Cancel, MD;  Location: [facility] ORS;  Service: Orthopedics;  Laterality: Right;  70 mins    Allergies  Allergen Reactions   Naproxen Hives   No [facility] Food Allergies Other (See Comments)    Scallops - distress, nausea and vomitting    Outpatient Encounter Medications as of 08/26/2021  Medication Sig   acetaminophen (TYLENOL)  325 MG tablet Take 650 mg by mouth as needed for mild pain.   amantadine (SYMMETREL) 100 MG capsule Take 100 mg by mouth in the morning and at bedtime. Give one tab with breakfast and lunch   amLODipine (NORVASC) 5 MG tablet Take 5 mg by mouth daily.   amoxicillin (AMOXIL) 400 MG/5ML suspension Take 6.3 mLs (500 mg total) by mouth 2 (two) times daily.   apixaban (ELIQUIS) 5 MG TABS tablet Take 1 tablet (5 mg total) by mouth 2 (two) times daily.   Artificial Saliva (BIOTENE MOISTURIZING MOUTH MT) Use as directed 2 sprays in the mouth or throat in the morning, at noon, and at bedtime.   atorvastatin (LIPITOR) 80 MG tablet Take 1 tablet (80 mg total) by mouth daily.   bisacodyl (DULCOLAX) 10 MG suppository Place 1  suppository (10 mg total) rectally daily as needed for moderate constipation or mild constipation.   clobetasol (TEMOVATE) 0.05 % external solution Apply 1 application  topically as needed (itching).   diazePAM, 15 MG Dose, (VALTOCO 15 MG DOSE) 2 x 7.5 MG/0.1ML LQPK Place 7.5 mg into each nostril (total of 15 mg) for grand mal seizure lasting over 5 minutes. Can repeat once in 4 hours. Do Not use more than twice in 24 hours   dofetilide (TIKOSYN) 125 MCG capsule Take 3 capsules (375 mcg total) by mouth 2 (two) times daily.   famotidine (PEPCID) 20 MG tablet Take 20 mg by mouth at bedtime as needed for indigestion.   finasteride (PROSCAR) 5 MG tablet Take 1 tablet (5 mg total) by mouth daily.   hydrocortisone 2.5 % cream Apply 1 application. topically daily as needed (itching).   lacosamide (VIMPAT) 50 MG TABS tablet Take 1 tablet (50 mg total) by mouth 2 (two) times daily.   levalbuterol (XOPENEX) 0.63 MG/3ML nebulizer solution Take 0.63 mg by nebulization every 6 (six) hours as needed for wheezing or shortness of breath.   loratadine (CLARITIN) 10 MG tablet Take 10 mg by mouth daily.   magnesium gluconate (MAGONATE) 500 MG tablet Take 1 tablet (500 mg total) by mouth daily.   methenamine  (HIPREX) 1 g tablet Take 1 g by mouth every 12 (twelve) hours.   Multiple Vitamin (MULTIVITAMIN WITH MINERALS) TABS tablet Take 1 tablet by mouth daily.   nystatin (MYCOSTATIN/NYSTOP) powder Apply 1 Application topically 2 (two) times daily.   Polyethyl Glycol-Propyl Glycol (SYSTANE) 0.4-0.3 % SOLN Place 2 drops into both eyes daily.   polyethylene glycol (MIRALAX / GLYCOLAX) 17 g packet Take 17 g by mouth every other day.   potassium chloride (KLOR-CON M) 10 MEQ tablet Take 1 tablet (10 mEq total) by mouth daily.   senna-docusate (SENOKOT-S) 8.6-50 MG tablet Take 1 tablet by mouth at bedtime as needed.   torsemide (DEMADEX) 20 MG tablet Take 20 mg by mouth as needed.   No facility-administered encounter medications on file as of 08/26/2021.    Review of Systems  Unable to perform ROS: Patient nonverbal    Immunization History  Administered Date(s) Administered   Fluad Quad(high Dose 65+) 12/06/2018, 12/09/2020   Influenza Whole 12/25/2008, 01/05/2010, 12/30/2011   Influenza, High Dose Seasonal PF 12/14/2016   Influenza,inj,Quad PF,6+ Mos 12/19/2013   Influenza,inj,quad, With Preservative 11/22/2017   Influenza-Unspecified 12/14/2015, 12/24/2019   Moderna Covid-19 Vaccine Bivalent Booster 38yr & up 01/12/2021   Moderna SARS-COV2 Booster Vaccination 01/02/2020, 06/26/2020   Moderna Sars-Covid-2 Vaccination 04/08/2019, 05/05/2019   Pneumococcal Conjugate-13 12/19/2013   Pneumococcal Polysaccharide-23 09/24/2007, 05/22/2017   Td 12/25/2008   Tdap 03/22/2018   Zoster Recombinat (Shingrix) 07/16/2019, 10/28/2019   Zoster, Live 09/24/2007   Pertinent  Health Maintenance Due  Topic Date Due   FOOT EXAM  Never done   OPHTHALMOLOGY EXAM  Never done   URINE MICROALBUMIN  Never done   INFLUENZA VACCINE  10/05/2021   HEMOGLOBIN A1C  01/05/2022   COLONOSCOPY (Pts [id number] coverage will need to be confirmed)  Discontinued      08/10/2021    9:36 PM 08/10/2021   11:10 PM 08/11/2021     8:00 AM 08/11/2021    7:30 PM 08/12/2021    8:00 AM  Fall Risk  Patient Fall Risk Level High fall risk High fall risk High fall risk High fall risk High fall risk   Functional Status Survey:  Vitals:   08/26/21 1600  BP: 118/75  Pulse: 78  Resp: 16  Temp: (!) 97.5 F (36.4 C)  SpO2: 97%  Weight: 144 lb 3.2 oz (65.4 kg)  Height: '5\' 7"'$  (1.702 m)   Body mass index is 22.58 kg/m. Physical Exam Vitals and nursing note reviewed.  Constitutional:      General: He is not in acute distress.    Appearance: He is not diaphoretic.  HENT:     Head: Normocephalic and atraumatic.     Mouth/Throat:     Mouth: Mucous membranes are moist.     Pharynx: Oropharynx is clear.  Neck:     Thyroid: No thyromegaly.     Vascular: No JVD.     Trachea: No tracheal deviation.  Cardiovascular:     Rate and Rhythm: Normal rate and regular rhythm.     Heart sounds: No murmur heard. Pulmonary:     Effort: No respiratory distress.     Breath sounds: Normal breath sounds. No wheezing.     Comments: Slight increase in work of breathing.  Abdominal:     General: Bowel sounds are normal. There is no distension.     Palpations: Abdomen is soft.     Tenderness: There is no abdominal tenderness.  Musculoskeletal:     Comments: Trace edema to both ankles.   Lymphadenopathy:     Cervical: No cervical adenopathy.  Skin:    General: Skin is warm and dry.     Coloration: Skin is pale.  Neurological:     Comments: Difficult to arouse which is his normal.  Left sided weakness.      Labs reviewed: Recent Labs    12/18/20 1630 12/19/20 0610 12/19/20 1632 12/20/20 0153 02/08/21 0000 02/09/21 0000 08/10/21 6712 08/10/21 0957 08/11/21 0408 08/12/21 0107 08/16/21 0000 08/23/21 0000 08/26/21 0000  NA  --  139  --    < > 150*   < > 141   < > 140 142 140 148* 140  K  --  3.5  --    < > 3.7   < > 3.8   < > 3.6 3.9 4.2 4.3 4.0  CL  --  108  --    < > 107   < > 111  --  109 113* 107 110* 106  CO2   --  23  --    < > 22   < > 16*  --  '24 23 21 '$ 27* 24*  GLUCOSE  --  186*  --    < >  --    < > 148*  --  120* 143*  --   --   --   BUN  --  16  --    < > 23*   < > 22  --  '17 17 19 '$ 25* 27*  CREATININE  --  0.76  --    < > 0.9   < > 1.18  --  0.81 0.83 0.6 0.8 0.6  CALCIUM  --  7.7*  --    < > 9.9   < > 8.8*  --  8.6* 8.7* 8.8 9.4 9.2  MG 1.9 2.0 1.9   < > 2.1  --   --   --  1.9 2.0  --   --   --   PHOS 3.1 2.7 2.4*  --   --   --   --   --   --   --   --   --   --    < > =  values in this interval not displayed.   Recent Labs    04/19/21 0000 05/11/21 1132 07/12/21 1056 08/10/21 0937 08/11/21 0408  AST 23   < > '22 29 27  '$ ALT 20   < > '22 26 23  '$ ALKPHOS 175*  --   --  93 80  BILITOT  --    < > 0.7 0.5 0.8  PROT  --    < > 7.2 6.4* 5.8*  ALBUMIN 3.6  --   --  3.3* 3.1*   < > = values in this interval not displayed.   Recent Labs    07/12/21 1056 08/10/21 0937 08/10/21 0957 08/11/21 0408 08/12/21 0107  WBC 9.0 16.1*  --  9.1 10.2  NEUTROABS 6,804 14.3*  --   --  7.8*  HGB 10.9* 10.0* 9.9* 9.4* 9.4*  HCT 33.9* 31.6* 29.0* 28.7* 29.8*  MCV 90.4 96.3  --  92.0 94.3  PLT 426* 238  --  218 227   Lab Results  Component Value Date   TSH 1.773 01/24/2021   Lab Results  Component Value Date   HGBA1C 5.7 07/05/2021   Lab Results  Component Value Date   CHOL 110 07/05/2021   HDL 43 07/05/2021   LDLCALC 57 07/05/2021   TRIG 51 07/05/2021   CHOLHDL 2.8 08/08/2020    Significant Diagnostic Results in last 30 days:  EEG adult  Result Date: 08/10/2021 Lora Havens, MD     08/10/2021  5:38 PM Patient Name: JERALD HENNINGTON MRN: [id number] Epilepsy Attending: Lora Havens Referring Physician/Provider: Norval Morton, MD Date: 08/10/2021 Duration: 24.01 mins Patient history: 81 year old male with breakthrough seizure in the setting of possible bronchitis or pneumonia. EEG to evaluate for seizure. Level of alertness:  lethargic AEDs during EEG study: LCM Technical aspects: This EEG  study was done with scalp electrodes positioned according to the 10-20 International system of electrode placement. Electrical activity was acquired at a sampling rate of '500Hz'$  and reviewed with a high frequency filter of '70Hz'$  and a low frequency filter of '1Hz'$ . EEG data were recorded continuously and digitally stored. Description: No clear posterior dominant rhythm was seen. EEG showed continuous 5 to 8 Hz theta-alpha activity in left hemisphere. There is low amplitude 2-'3hz'$  delta slowing in right hemisphere.   Hyperventilation and photic stimulation were not performed.   ABNORMALITY - Continuous slow, generalized and lateralized right hemisphere IMPRESSION: This study is suggestive of cortical dysfunction arising from right hemisphere likely secondary to underlying structural abnormality. Additionally there is moderate diffuse encephalopathy, nonspecific etiology. No seizures or epileptiform discharges were seen throughout the recording. Lora Havens   CT Head Wo Contrast  Result Date: 08/10/2021 CLINICAL DATA:  Mental status changes. EXAM: CT HEAD WITHOUT CONTRAST TECHNIQUE: Contiguous axial images were obtained from the base of the skull through the vertex without intravenous contrast. RADIATION DOSE REDUCTION: This exam was performed according to the departmental dose-optimization program which includes automated exposure control, adjustment of the mA and/or kV according to patient size and/or use of iterative reconstruction technique. COMPARISON:  01/23/2021.  12/14/2020. FINDINGS: Brain: There is no evidence for acute hemorrhage, hydrocephalus, mass lesion, or abnormal extra-axial fluid collection. No definite CT evidence for acute infarction. Diffuse loss of parenchymal volume is consistent with atrophy. Patchy low attenuation in the deep hemispheric and periventricular white matter is nonspecific, but likely reflects chronic microvascular ischemic demyelination. Global ventriculomegaly likely a  combination of atrophy and previous infarcts. Stable appearance right frontotemporal  parietal encephalomalacia consistent with MCA territory infarct. Encephalomalacia in the left parietal lobe is stable. Vascular: No hyperdense vessel or unexpected calcification. Skull: Right craniotomy defect again noted. Sinuses/Orbits: The visualized paranasal sinuses and mastoid air cells are clear. Visualized portions of the globes and intraorbital fat are unremarkable. Other: None. IMPRESSION: 1. No acute intracranial abnormality. 2. 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 3. Atrophy with chronic small vessel white matter ischemic disease. Electronically Signed   By: Misty Stanley M.D.   On: 08/10/2021 11:44   DG Chest Port 1 View  Result Date: 08/10/2021 CLINICAL DATA:  Altered mental status. EXAM: PORTABLE CHEST 1 VIEW COMPARISON:  January 23, 2021. FINDINGS: Patchy bilateral airspace opacities are mildly improved. Visible pleural effusions or pneumothorax. Similar cardiomediastinal silhouette. Cardiac valve replacements. Median sternotomy. No evidence of acute osseous abnormality. IMPRESSION: Patchy bilateral airspace opacities are mildly improved. Electronically Signed   By: Margaretha Sheffield M.D.   On: 08/10/2021 10:16    Assessment/Plan  1. Acute on chronic diastolic CHF (congestive heart failure) ([place]) This is very mild right now as he does not have rales, only trace edema and actually has lost 1 lb but I do see a slight increase in his work of breathing since he had IVF. In the future we may need to avoid IVF and push oral only or given 250 bolus slowly.  He has a f/u CXR recommended in July for f/u after pna. No signs of pna at this time. Will continue to monitor and can move up xray sooner if needed.   Will d/c prn torsemide, needs provider evaluation prior to initiation.   2. Hypernatremia Resolved   Family/ staff Communication: discussed with Wife Herbert Pun  Labs/tests ordered:  BMP  weekly

## 2021-08-26 NOTE — Progress Notes (Signed)
[facility] [address] [place]. [place] [address] [phone]       OFFICE FOLLOW UP NOTE  [name] Date of Birth:  01/20/1941 Medical Record Number:  [id number]   Reason for visit: Initial CPAP follow-up    Virtual Visit via Video Note  Virtual visit completed through [employer], a video enabled telemedicine application. Due to national recommendations of social distancing due to COVID-19, a virtual visit is felt to be most appropriate for this patient at this time. Reviewed limitations, risks, security and privacy concerns of performing a virtual visit and the availability of in person appointments. I also reviewed that there may be a patient responsible charge related to this service. The patient agreed to proceed.    Patient location: home Provider location: in office, [facility] Persons participating in this virtual visit: Patient, family and provider   If any vitals were documented, they were collected by patient at home unless specified below.     SUBJECTIVE:   CHIEF COMPLAINT:  CPAP follow-up  HPI:   Update 08/26/2021 JM: Patient is being seen via MyChart video visit for initial CPAP compliance visit.  Accompanied by his wife and son who provide history.  Completed HST 5/19 which showed severe sleep apnea with total calculated AHI 57.8/h without significant hypoxia seen.  AutoPap initiated 5/9.  Review of compliance report as below showing adequate usage although elevated AHI of 15.8 with majority of them centrals.  He has tried different types of masks -currently using fullface mask which he tolerates well.  No evidence of mask leaks. Wife does notice improvement of sleepiness with use of CPAP.   Epworth Sleepiness Scale 16/24 (prior to CPAP 18/24) Fatigue severity scale 63/63 (Obtained scales from wife)        History provided for reference purposes only Consult visit 08/26/2021 Dr. Brett Fairy: [name] [name] is a 81  y.o. year old White or Caucasian male patient seen in consultation  on 06/09/2021 .   [name] [name] has a history spanning from [place] 2022 to now raised to very impairing strokes.  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  The progress seem to have been limited somewhat by bouts of lethargy, there are cognitive deficits and especially communication deficits.  He is now in a nursing facility for progressive therapy.   He resides at [facility]  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   He had fallen at church backwards striking the occiput on Jul 26, 2020 and in the emergency room there was initially no acute finding noted the day after he returned with difficulty speaking and now with right-sided weakness.  Dr. Ronnald Ramp evacuated the left-sided traumatic subdural hemorrhage that had caused mass effect and subfalcine herniation.   Chief concern according to patient :  Lethargy, possible apnea/    Patient is unable to answer questions other than yes/ no.   Wheelchair bound, paresis,       patient was a snorer, but apneas were not witnessed.     Family medical /sleep history: none. Brother has  sleep apnea.     Social history:  Patient is retired from [employer], private- and lives in a [name]  Family status is married .    Sleep habits are as follows: according to his smart watch , he sleeps 2-3 hours, but that is not possible.  Hoyer lift needed, medical bed needed.       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    Other and unspecified hyperlipidemia    Pre-diabetes    Previous back surgery 1978, jan 2007   Psychosexual dysfunction with inhibited sexual excitement    SOB (shortness of breath)    "with heavy exercise"   Stroke ([place]) 08/2013   "I WAS IN AFIB AND THREW A CLOT, THE EFFECTS WERE TRANSITORY AND DIDNT LAST BUT FOR 30 MINUTES"    Thoracic aortic aneurysm (HCC)     PSH:  Past Surgical History:  Procedure Laterality Date   AORTIC AND MITRAL VALVE REPLACEMENT     09/2004   CARDIOVERSION     3 times from 2004-2006   CARDIOVERSION N/A 09/26/2013   Procedure: CARDIOVERSION;  Surgeon: Larey Dresser, MD;  Location[place];  Service: Cardiovascular;  Laterality: N/A;   CARDIOVERSION N/A 06/19/2014   Procedure: CARDIOVERSION;  Surgeon: Jerline Pain, MD;  Location: [place];  Service: Cardiovascular;  Laterality: N/A;   CARDIOVERSION N/A 04/24/2017   Procedure: CARDIOVERSION;  Surgeon: Larey Dresser, MD;  Location: [place];  Service: Cardiovascular;  Laterality: N/A;   CARDIOVERSION N/A 06/08/2017   Procedure: CARDIOVERSION;  Surgeon: Pixie Casino, MD;  Location: [facility] ENDOSCOPY;  Service: Cardiovascular;  Laterality: N/A;   CARDIOVERSION N/A 08/24/2017   Procedure: CARDIOVERSION;  Surgeon: Skeet Latch, MD;  Location: [facility] ENDOSCOPY;  Service: Cardiovascular;  Laterality: N/A;   CARDIOVERSION N/A 02/14/2018   Procedure: CARDIOVERSION;  Surgeon: Larey Dresser, MD;  Location: [facility] ENDOSCOPY;  Service: Cardiovascular;  Laterality: N/A;   COLONOSCOPY     CRANIOTOMY N/A 07/28/2020   Procedure: CRANIOTOMY FOR EVACUATION OF SUBDURAL HEMATOMA;  Surgeon: Eustace Disch, MD;  Location: [place];  Service: Neurosurgery;  Laterality: N/A;   IR ANGIO VERTEBRAL SEL SUBCLAVIAN INNOMINATE UNI R MOD SED  08/07/2020   IR CT HEAD LTD  08/07/2020   IR PERCUTANEOUS ART THROMBECTOMY/INFUSION INTRACRANIAL INC DIAG ANGIO  08/07/2020   laminectomies     10/1975 and in 03/2005   RADIOLOGY WITH ANESTHESIA N/A 08/07/2020   Procedure: IR WITH ANESTHESIA;  Surgeon: Luanne Bras, MD;  Location: [place];  Service: Radiology;  Laterality: N/A;   TEE WITHOUT CARDIOVERSION N/A 12/18/2020   Procedure: TRANSESOPHAGEAL ECHOCARDIOGRAM (TEE);  Surgeon: Larey Dresser, MD;  Location: [facility] ENDOSCOPY;  Service: Cardiovascular;  Laterality: N/A;   [place] [facility] [place] Right 04/03/2018   Procedure:  TOTAL HIP ARTHROPLASTY ANTERIOR APPROACH;  Surgeon: Paralee Cancel, MD;  Location: [facility];  Service: Orthopedics;  Laterality: Right;  70 mins    Social History:  Social History   Socioeconomic History   Marital status: Married    Spouse name: Herbert Pun   Number of children: 2   Years of education: BS   Highest education level: Not on file  Occupational History   Occupation: retired  Tobacco Use   Smoking status: Never   Smokeless tobacco: Never  Vaping Use   Vaping Use: Never used  Substance and Sexual Activity   Alcohol use: Never    Comment: quit 5/22   Drug use: Never   Sexual activity: Not Currently    Partners: Female  Other Topics Concern   Not on file  Social History Narrative   10/27/20 residing at [facility] care center since 09/17/20    [employer] - BS, JD. married - '67. 2 sons - '74, '77  one son is a Nurse, learning disability for [employer]; 4 grandchildren. 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 marriage in good health. End-of-life: discussed issues and provided packet with the MOST form and out of facility order.      Caffeine: decaf coffee   Social Determinants of Health   Financial Resource Strain: Low Risk  (06/27/2018)   Overall Financial Resource Strain (CARDIA)    Difficulty of Paying Living Expenses: Not hard at all  Food Insecurity: No Food Insecurity (06/27/2018)   Hunger Vital Sign    Worried About Running Out of Food in the Last Year: Never true    Ran Out of Food in the Last Year: Never true  Transportation Needs: No Transportation Needs (06/27/2018)   PRAPARE - Hydrologist (Medical): No    Lack of Transportation (Non-Medical): No  Physical Activity: Not on file  Stress: Not on file  Social Connections: Not on file  Intimate Partner Violence: Not on file    Family History:  Family History  Problem Relation Age of Onset   Macular degeneration Mother        macular degeneration   Cancer Father        intestinal/GI   Diabetes Paternal Aunt    Heart attack Maternal Grandfather    Diabetes Brother     Medications:   Current Outpatient Medications on File Prior to Visit  Medication Sig Dispense Refill   acetaminophen (TYLENOL) 325 MG tablet Take 650 mg by mouth as needed for mild pain.     amantadine (SYMMETREL) 100 MG capsule Take 100 mg by mouth in the morning and at bedtime. Give one tab with breakfast and lunch     amLODipine (NORVASC) 5 MG tablet Take 5 mg by mouth daily.     amoxicillin (AMOXIL) 400 MG/5ML suspension Take 6.3 mLs (500 mg total) by mouth 2 (two) times daily. 100 mL 0   apixaban (ELIQUIS) 5 MG TABS tablet Take 1 tablet (5 mg total) by mouth 2 (two) times daily. 60 tablet 0   Artificial Saliva (BIOTENE MOISTURIZING MOUTH MT) Use as directed 2 sprays in the mouth or throat in the morning, at noon, and at bedtime.     atorvastatin (LIPITOR) 80 MG tablet Take 1 tablet (80 mg total) by mouth  daily.     bisacodyl (DULCOLAX) 10 MG suppository Place 1 suppository (10 mg total) rectally daily as needed for moderate constipation  or mild constipation.     clobetasol (TEMOVATE) 0.05 % external solution Apply 1 application  topically as needed (itching).     diazePAM, 15 MG Dose, (VALTOCO 15 MG DOSE) 2 x 7.5 MG/0.1ML LQPK Place 7.5 mg into each nostril (total of 15 mg) for grand mal seizure lasting over 5 minutes. Can repeat once in 4 hours. Do Not use more than twice in 24 hours 2 each 1   dofetilide (TIKOSYN) 125 MCG capsule Take 3 capsules (375 mcg total) by mouth 2 (two) times daily.     famotidine (PEPCID) 20 MG tablet Take 20 mg by mouth at bedtime as needed for indigestion.     finasteride (PROSCAR) 5 MG tablet Take 1 tablet (5 mg total) by mouth daily. 30 tablet 0   hydrocortisone 2.5 % cream Apply 1 application. topically daily as needed (itching).     lacosamide (VIMPAT) 50 MG TABS tablet Take 1 tablet (50 mg total) by mouth 2 (two) times daily. 60 tablet 3   levalbuterol (XOPENEX) 0.63 MG/3ML nebulizer solution Take 0.63 mg by nebulization every 6 (six) hours as needed for wheezing or shortness of breath.     loratadine (CLARITIN) 10 MG tablet Take 10 mg by mouth daily.     magnesium gluconate (MAGONATE) 500 MG tablet Take 1 tablet (500 mg total) by mouth daily. 30 tablet 0   methenamine (HIPREX) 1 g tablet Take 1 g by mouth every 12 (twelve) hours.     Multiple Vitamin (MULTIVITAMIN WITH MINERALS) TABS tablet Take 1 tablet by mouth daily.     nystatin (MYCOSTATIN/NYSTOP) powder Apply 1 Application topically 2 (two) times daily.     Polyethyl Glycol-Propyl Glycol (SYSTANE) 0.4-0.3 % SOLN Place 2 drops into both eyes daily.     polyethylene glycol (MIRALAX / GLYCOLAX) 17 g packet Take 17 g by mouth every other day. 14 each    potassium chloride (KLOR-CON M) 10 MEQ tablet Take 1 tablet (10 mEq total) by mouth daily. 30 tablet 0   senna-docusate (SENOKOT-S) 8.6-50 MG tablet Take 1  tablet by mouth at bedtime as needed.     torsemide (DEMADEX) 20 MG tablet Take 20 mg by mouth as needed.     No current facility-administered medications on file prior to visit.    Allergies:   Allergies  Allergen Reactions   Naproxen Hives   No [facility] Food Allergies Other (See Comments)    Scallops - distress, nausea and vomitting      OBJECTIVE:  Patient resting comfortably n chair with CPAP mask on, wife provides history        ASSESSMENT/PLAN: [name] [name] is a 81 y.o. year old male with diagnosis of severe sleep apnea in 06/2021 and initiation of AutoPap in 07/2021.     Severe sleep apnea: Despite adequate compliance, trial of different masks and no evidence of leaks, AHI remains elevated with majority of them central.  Further discussed with sleep clinic who recommended switching to auto BiPAP - sleep lab will place order.  In these situations, typically recommend pursuing titration study but due to other medical reasons, unfortunately this is not an option.    Will plan on f/u in 60-90 days follow up visit with Dr. Brett Fairy after initiation of AutoBiPAP    CC:  [facility]: Dr. Brett Fairy PCP: [name], MD    I spent 20 minutes of face-to-face and non-face-to-face time with patient and family via [facility] video visit.  This included previsit chart review, lab  review, study review, electronic health record documentation, patient education regarding diagnosis of sleep apnea with review and discussion of compliance report, concern of continued elevated AHI and transition to auto BiPAP and answered all other questions to patient and family's satisfaction   Frann Rider, [facility]  [facility] [address] [place], [place] [address]  Phone [phone] Fax [phone] Note: This document was prepared with digital dictation and possible smart phrase technology. Any transcriptional errors that result from this process are  unintentional.

## 2021-08-26 NOTE — Patient Instructions (Signed)
Plan on transitioning to auto BiPAP -you will be called to have this set up   Plan on follow-up with Dr. Brett Fairy in 60 to 90 days or call earlier if needed

## 2021-08-27 DIAGNOSIS — I69354 Hemiplegia and hemiparesis following cerebral infarction affecting left non-dominant side: Secondary | ICD-10-CM | POA: Diagnosis not present

## 2021-08-27 DIAGNOSIS — H538 Other visual disturbances: Secondary | ICD-10-CM | POA: Diagnosis not present

## 2021-08-27 DIAGNOSIS — R41841 Cognitive communication deficit: Secondary | ICD-10-CM | POA: Diagnosis not present

## 2021-08-27 DIAGNOSIS — I5022 Chronic systolic (congestive) heart failure: Secondary | ICD-10-CM | POA: Diagnosis not present

## 2021-08-27 DIAGNOSIS — R41840 Attention and concentration deficit: Secondary | ICD-10-CM | POA: Diagnosis not present

## 2021-08-27 DIAGNOSIS — R293 Abnormal posture: Secondary | ICD-10-CM | POA: Diagnosis not present

## 2021-08-27 DIAGNOSIS — G4089 Other seizures: Secondary | ICD-10-CM | POA: Diagnosis not present

## 2021-08-27 DIAGNOSIS — R278 Other lack of coordination: Secondary | ICD-10-CM | POA: Diagnosis not present

## 2021-08-27 DIAGNOSIS — I69812 Visuospatial deficit and spatial neglect following other cerebrovascular disease: Secondary | ICD-10-CM | POA: Diagnosis not present

## 2021-08-27 DIAGNOSIS — M6389 Disorders of muscle in diseases classified elsewhere, multiple sites: Secondary | ICD-10-CM | POA: Diagnosis not present

## 2021-08-27 DIAGNOSIS — I69391 Dysphagia following cerebral infarction: Secondary | ICD-10-CM | POA: Diagnosis not present

## 2021-08-27 DIAGNOSIS — S065X3S Traumatic subdural hemorrhage with loss of consciousness of 1 hour to 5 hours 59 minutes, sequela: Secondary | ICD-10-CM | POA: Diagnosis not present

## 2021-08-30 DIAGNOSIS — I69812 Visuospatial deficit and spatial neglect following other cerebrovascular disease: Secondary | ICD-10-CM | POA: Diagnosis not present

## 2021-08-30 DIAGNOSIS — H538 Other visual disturbances: Secondary | ICD-10-CM | POA: Diagnosis not present

## 2021-08-30 DIAGNOSIS — I38 Endocarditis, valve unspecified: Secondary | ICD-10-CM | POA: Diagnosis not present

## 2021-08-30 DIAGNOSIS — S065X3S Traumatic subdural hemorrhage with loss of consciousness of 1 hour to 5 hours 59 minutes, sequela: Secondary | ICD-10-CM | POA: Diagnosis not present

## 2021-08-30 DIAGNOSIS — R278 Other lack of coordination: Secondary | ICD-10-CM | POA: Diagnosis not present

## 2021-08-30 DIAGNOSIS — I69391 Dysphagia following cerebral infarction: Secondary | ICD-10-CM | POA: Diagnosis not present

## 2021-08-30 DIAGNOSIS — M6389 Disorders of muscle in diseases classified elsewhere, multiple sites: Secondary | ICD-10-CM | POA: Diagnosis not present

## 2021-08-30 DIAGNOSIS — G4089 Other seizures: Secondary | ICD-10-CM | POA: Diagnosis not present

## 2021-08-30 DIAGNOSIS — R41840 Attention and concentration deficit: Secondary | ICD-10-CM | POA: Diagnosis not present

## 2021-08-30 DIAGNOSIS — R293 Abnormal posture: Secondary | ICD-10-CM | POA: Diagnosis not present

## 2021-08-30 DIAGNOSIS — I69354 Hemiplegia and hemiparesis following cerebral infarction affecting left non-dominant side: Secondary | ICD-10-CM | POA: Diagnosis not present

## 2021-08-30 DIAGNOSIS — R41841 Cognitive communication deficit: Secondary | ICD-10-CM | POA: Diagnosis not present

## 2021-08-30 DIAGNOSIS — I5022 Chronic systolic (congestive) heart failure: Secondary | ICD-10-CM | POA: Diagnosis not present

## 2021-08-30 LAB — COMPREHENSIVE METABOLIC PANEL: Calcium: 9.2 (ref 8.7–10.7)

## 2021-08-30 LAB — BASIC METABOLIC PANEL
BUN: 17 (ref 4–21)
CO2: 23 — AB (ref 13–22)
Chloride: 107 (ref 99–108)
Creatinine: 0.7 (ref 0.6–1.3)
Glucose: 88
Potassium: 4.2 mEq/L (ref 3.5–5.1)
Sodium: 141 (ref 137–147)

## 2021-08-31 DIAGNOSIS — H538 Other visual disturbances: Secondary | ICD-10-CM | POA: Diagnosis not present

## 2021-08-31 DIAGNOSIS — R293 Abnormal posture: Secondary | ICD-10-CM | POA: Diagnosis not present

## 2021-08-31 DIAGNOSIS — S065X3S Traumatic subdural hemorrhage with loss of consciousness of 1 hour to 5 hours 59 minutes, sequela: Secondary | ICD-10-CM | POA: Diagnosis not present

## 2021-08-31 DIAGNOSIS — R278 Other lack of coordination: Secondary | ICD-10-CM | POA: Diagnosis not present

## 2021-08-31 DIAGNOSIS — I69812 Visuospatial deficit and spatial neglect following other cerebrovascular disease: Secondary | ICD-10-CM | POA: Diagnosis not present

## 2021-08-31 DIAGNOSIS — R41840 Attention and concentration deficit: Secondary | ICD-10-CM | POA: Diagnosis not present

## 2021-08-31 DIAGNOSIS — M6389 Disorders of muscle in diseases classified elsewhere, multiple sites: Secondary | ICD-10-CM | POA: Diagnosis not present

## 2021-08-31 DIAGNOSIS — G4089 Other seizures: Secondary | ICD-10-CM | POA: Diagnosis not present

## 2021-08-31 DIAGNOSIS — I5022 Chronic systolic (congestive) heart failure: Secondary | ICD-10-CM | POA: Diagnosis not present

## 2021-08-31 DIAGNOSIS — I69391 Dysphagia following cerebral infarction: Secondary | ICD-10-CM | POA: Diagnosis not present

## 2021-08-31 DIAGNOSIS — I69354 Hemiplegia and hemiparesis following cerebral infarction affecting left non-dominant side: Secondary | ICD-10-CM | POA: Diagnosis not present

## 2021-08-31 DIAGNOSIS — R41841 Cognitive communication deficit: Secondary | ICD-10-CM | POA: Diagnosis not present

## 2021-09-01 ENCOUNTER — Encounter: Payer: Self-pay | Admitting: Internal Medicine

## 2021-09-01 DIAGNOSIS — I69391 Dysphagia following cerebral infarction: Secondary | ICD-10-CM | POA: Diagnosis not present

## 2021-09-01 DIAGNOSIS — I69354 Hemiplegia and hemiparesis following cerebral infarction affecting left non-dominant side: Secondary | ICD-10-CM | POA: Diagnosis not present

## 2021-09-01 DIAGNOSIS — M6389 Disorders of muscle in diseases classified elsewhere, multiple sites: Secondary | ICD-10-CM | POA: Diagnosis not present

## 2021-09-01 DIAGNOSIS — I5022 Chronic systolic (congestive) heart failure: Secondary | ICD-10-CM | POA: Diagnosis not present

## 2021-09-01 DIAGNOSIS — R293 Abnormal posture: Secondary | ICD-10-CM | POA: Diagnosis not present

## 2021-09-01 DIAGNOSIS — G4089 Other seizures: Secondary | ICD-10-CM | POA: Diagnosis not present

## 2021-09-01 DIAGNOSIS — R41840 Attention and concentration deficit: Secondary | ICD-10-CM | POA: Diagnosis not present

## 2021-09-01 DIAGNOSIS — R278 Other lack of coordination: Secondary | ICD-10-CM | POA: Diagnosis not present

## 2021-09-01 DIAGNOSIS — H538 Other visual disturbances: Secondary | ICD-10-CM | POA: Diagnosis not present

## 2021-09-01 DIAGNOSIS — I69812 Visuospatial deficit and spatial neglect following other cerebrovascular disease: Secondary | ICD-10-CM | POA: Diagnosis not present

## 2021-09-01 DIAGNOSIS — S065X3S Traumatic subdural hemorrhage with loss of consciousness of 1 hour to 5 hours 59 minutes, sequela: Secondary | ICD-10-CM | POA: Diagnosis not present

## 2021-09-01 DIAGNOSIS — R41841 Cognitive communication deficit: Secondary | ICD-10-CM | POA: Diagnosis not present

## 2021-09-02 DIAGNOSIS — I69812 Visuospatial deficit and spatial neglect following other cerebrovascular disease: Secondary | ICD-10-CM | POA: Diagnosis not present

## 2021-09-02 DIAGNOSIS — M6389 Disorders of muscle in diseases classified elsewhere, multiple sites: Secondary | ICD-10-CM | POA: Diagnosis not present

## 2021-09-02 DIAGNOSIS — I5022 Chronic systolic (congestive) heart failure: Secondary | ICD-10-CM | POA: Diagnosis not present

## 2021-09-02 DIAGNOSIS — I69391 Dysphagia following cerebral infarction: Secondary | ICD-10-CM | POA: Diagnosis not present

## 2021-09-02 DIAGNOSIS — R293 Abnormal posture: Secondary | ICD-10-CM | POA: Diagnosis not present

## 2021-09-02 DIAGNOSIS — G4089 Other seizures: Secondary | ICD-10-CM | POA: Diagnosis not present

## 2021-09-02 DIAGNOSIS — H538 Other visual disturbances: Secondary | ICD-10-CM | POA: Diagnosis not present

## 2021-09-02 DIAGNOSIS — R41840 Attention and concentration deficit: Secondary | ICD-10-CM | POA: Diagnosis not present

## 2021-09-02 DIAGNOSIS — R278 Other lack of coordination: Secondary | ICD-10-CM | POA: Diagnosis not present

## 2021-09-02 DIAGNOSIS — S065X3S Traumatic subdural hemorrhage with loss of consciousness of 1 hour to 5 hours 59 minutes, sequela: Secondary | ICD-10-CM | POA: Diagnosis not present

## 2021-09-02 DIAGNOSIS — R41841 Cognitive communication deficit: Secondary | ICD-10-CM | POA: Diagnosis not present

## 2021-09-02 DIAGNOSIS — I69354 Hemiplegia and hemiparesis following cerebral infarction affecting left non-dominant side: Secondary | ICD-10-CM | POA: Diagnosis not present

## 2021-09-03 DIAGNOSIS — G4089 Other seizures: Secondary | ICD-10-CM | POA: Diagnosis not present

## 2021-09-03 DIAGNOSIS — R41841 Cognitive communication deficit: Secondary | ICD-10-CM | POA: Diagnosis not present

## 2021-09-03 DIAGNOSIS — H538 Other visual disturbances: Secondary | ICD-10-CM | POA: Diagnosis not present

## 2021-09-03 DIAGNOSIS — I69391 Dysphagia following cerebral infarction: Secondary | ICD-10-CM | POA: Diagnosis not present

## 2021-09-03 DIAGNOSIS — M6389 Disorders of muscle in diseases classified elsewhere, multiple sites: Secondary | ICD-10-CM | POA: Diagnosis not present

## 2021-09-03 DIAGNOSIS — I5022 Chronic systolic (congestive) heart failure: Secondary | ICD-10-CM | POA: Diagnosis not present

## 2021-09-03 DIAGNOSIS — R293 Abnormal posture: Secondary | ICD-10-CM | POA: Diagnosis not present

## 2021-09-03 DIAGNOSIS — I69812 Visuospatial deficit and spatial neglect following other cerebrovascular disease: Secondary | ICD-10-CM | POA: Diagnosis not present

## 2021-09-03 DIAGNOSIS — I69354 Hemiplegia and hemiparesis following cerebral infarction affecting left non-dominant side: Secondary | ICD-10-CM | POA: Diagnosis not present

## 2021-09-03 DIAGNOSIS — R41840 Attention and concentration deficit: Secondary | ICD-10-CM | POA: Diagnosis not present

## 2021-09-03 DIAGNOSIS — R278 Other lack of coordination: Secondary | ICD-10-CM | POA: Diagnosis not present

## 2021-09-03 DIAGNOSIS — S065X3S Traumatic subdural hemorrhage with loss of consciousness of 1 hour to 5 hours 59 minutes, sequela: Secondary | ICD-10-CM | POA: Diagnosis not present

## 2021-09-06 ENCOUNTER — Encounter: Payer: Self-pay | Admitting: Adult Health

## 2021-09-06 ENCOUNTER — Non-Acute Institutional Stay (SKILLED_NURSING_FACILITY): Payer: Medicare Other | Admitting: Adult Health

## 2021-09-06 DIAGNOSIS — R140 Abdominal distension (gaseous): Secondary | ICD-10-CM | POA: Diagnosis not present

## 2021-09-06 DIAGNOSIS — R293 Abnormal posture: Secondary | ICD-10-CM | POA: Diagnosis not present

## 2021-09-06 DIAGNOSIS — L03012 Cellulitis of left finger: Secondary | ICD-10-CM | POA: Diagnosis not present

## 2021-09-06 DIAGNOSIS — G4089 Other seizures: Secondary | ICD-10-CM | POA: Diagnosis not present

## 2021-09-06 DIAGNOSIS — I69812 Visuospatial deficit and spatial neglect following other cerebrovascular disease: Secondary | ICD-10-CM | POA: Diagnosis not present

## 2021-09-06 DIAGNOSIS — I69354 Hemiplegia and hemiparesis following cerebral infarction affecting left non-dominant side: Secondary | ICD-10-CM | POA: Diagnosis not present

## 2021-09-06 DIAGNOSIS — I38 Endocarditis, valve unspecified: Secondary | ICD-10-CM | POA: Diagnosis not present

## 2021-09-06 DIAGNOSIS — R41841 Cognitive communication deficit: Secondary | ICD-10-CM | POA: Diagnosis not present

## 2021-09-06 DIAGNOSIS — R278 Other lack of coordination: Secondary | ICD-10-CM | POA: Diagnosis not present

## 2021-09-06 DIAGNOSIS — J690 Pneumonitis due to inhalation of food and vomit: Secondary | ICD-10-CM

## 2021-09-06 DIAGNOSIS — S065X3S Traumatic subdural hemorrhage with loss of consciousness of 1 hour to 5 hours 59 minutes, sequela: Secondary | ICD-10-CM | POA: Diagnosis not present

## 2021-09-06 DIAGNOSIS — R41840 Attention and concentration deficit: Secondary | ICD-10-CM | POA: Diagnosis not present

## 2021-09-06 DIAGNOSIS — I5022 Chronic systolic (congestive) heart failure: Secondary | ICD-10-CM | POA: Diagnosis not present

## 2021-09-06 DIAGNOSIS — H538 Other visual disturbances: Secondary | ICD-10-CM | POA: Diagnosis not present

## 2021-09-06 DIAGNOSIS — M6389 Disorders of muscle in diseases classified elsewhere, multiple sites: Secondary | ICD-10-CM | POA: Diagnosis not present

## 2021-09-06 DIAGNOSIS — I69391 Dysphagia following cerebral infarction: Secondary | ICD-10-CM | POA: Diagnosis not present

## 2021-09-06 LAB — BASIC METABOLIC PANEL
BUN: 25 — AB (ref 4–21)
CO2: 25 — AB (ref 13–22)
Chloride: 111 — AB (ref 99–108)
Creatinine: 0.7 (ref 0.6–1.3)
Glucose: 98
Potassium: 4 mEq/L (ref 3.5–5.1)
Sodium: 147 (ref 137–147)

## 2021-09-06 LAB — COMPREHENSIVE METABOLIC PANEL
Calcium: 9.5 (ref 8.7–10.7)
eGFR: >90

## 2021-09-06 NOTE — Progress Notes (Signed)
Location:    [facility] Room Number: [place] of Service:  SNF [phone]) Provider:  Royal Hawthorn, NP  [name], MD  Patient Care Team: [name], MD as PCP - General (Internal Medicine) Larey Dresser, MD as PCP - Cardiology (Cardiology) [name], MD as Consulting Physician (Ophthalmology) [name], MD (Internal Medicine)  Extended Emergency Contact Information Primary Emergency Contact: [name] Address: [address]          [place], [place] [address] [place] Phone: [phone] Mobile Phone: [phone] Relation: Spouse Secondary Emergency Contact: [name], [place] [address] [name] of [place] Phone: [phone] Relation: Son  Code Status:  DNR Goals of care: Advanced Directive information    09/06/2021    2:44 PM  Advanced Directives  Does Patient Have a Medical Advance Directive? Yes  Type of Paramedic of [place];Living will;Out of facility DNR (pink MOST or yellow form)  Does patient want to make changes to medical advance directive? No - Patient declined  Copy of [place] in Chart? Yes - validated most recent copy scanned in chart (See row information)     Chief Complaint  Patient presents with   Acute Visit    Abdominal distention    HPI:  Pt is a 81 y.o. male seen today for an acute visit for abdominal distention.   PMH includes syncope with fall in May of 2022, TBI with SDH with craniotomy, acute right MCA s/p thrombectomy, bioprosthetic mitral and aortic valve replacement, DM II, diastiolic CHF, BPH, and afib.   He has had abd distention since 6/30.  No vomiting or fever. Did not appear to be in pain although pt is not able to verbalize. He had been having BMs but they were not soft. He was checked for impaction and was not. He was given fleets enema with good results. He is eating and  drinking fairly well and c/w with baseline. He was using cpap for severe sleep apnea and was recently transitioned to bipap per neurology notes.   Also he was having some weight gain and slight ankle edema with the abd distention. No sob. He did receive lasix 10 mg for two days. He has been more sensitive to diuretics recently and we have had to use lower doses. Prior to this he had hypernatremia and had a fluid bolus on 6/20.  Also nurse reports some redness to the left index finger. No purulent drainage   Of note he was in the hospital in June for a provoked seizure with possible asp pna. F/U xray recommended by the hospital. He was placed on IV antibiotics and then transitioned to augmentin. He is not on oxygen. Does have slight dry cough. No sputum.   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    Other and unspecified hyperlipidemia    Pre-diabetes    Previous back surgery 1978, jan 2007   Psychosexual dysfunction with inhibited sexual excitement    SOB (shortness of breath)    "with heavy exercise"   Stroke ([place]) 08/2013   "I WAS IN AFIB AND THREW A CLOT, THE EFFECTS WERE TRANSITORY AND DIDNT LAST BUT FOR 30 MINUTES"    Thoracic aortic aneurysm [facility])    Past Surgical History:  Procedure Laterality Date   AORTIC AND MITRAL VALVE REPLACEMENT     09/2004   CARDIOVERSION     3 times from 2004-2006   CARDIOVERSION N/A 09/26/2013   Procedure: CARDIOVERSION;  Surgeon: Larey Dresser, MD;  Location: [facility];  Service: Cardiovascular;  Laterality: N/A;   CARDIOVERSION N/A 06/19/2014   Procedure: CARDIOVERSION;  Surgeon: Jerline Pain, MD;  Location: [facility];  Service: Cardiovascular;  Laterality: N/A;   CARDIOVERSION N/A 04/24/2017   Procedure: CARDIOVERSION;  Surgeon: Larey Dresser, MD;  Location: [facility];  Service: Cardiovascular;  Laterality: N/A;   CARDIOVERSION N/A 06/08/2017   Procedure: CARDIOVERSION;  Surgeon: Pixie Casino, MD;  Location: [facility] ENDOSCOPY;  Service: Cardiovascular;  Laterality: N/A;   CARDIOVERSION N/A 08/24/2017   Procedure: CARDIOVERSION;  Surgeon: Skeet Latch, MD;  Location: [facility] ENDOSCOPY;  Service: Cardiovascular;  Laterality: N/A;   CARDIOVERSION N/A 02/14/2018   Procedure: CARDIOVERSION;  Surgeon: Larey Dresser, MD;  Location: [facility] ENDOSCOPY;  Service: Cardiovascular;  Laterality: N/A;   COLONOSCOPY     CRANIOTOMY N/A 07/28/2020   Procedure: CRANIOTOMY FOR EVACUATION OF SUBDURAL HEMATOMA;  Surgeon: Eustace Fleig, MD;  Location: [facility];  Service: Neurosurgery;  Laterality: N/A;   IR ANGIO VERTEBRAL SEL SUBCLAVIAN INNOMINATE UNI R MOD SED  08/07/2020   IR CT HEAD LTD  08/07/2020   IR PERCUTANEOUS ART THROMBECTOMY/INFUSION INTRACRANIAL INC DIAG ANGIO  08/07/2020   laminectomies     10/1975 and in 03/2005   RADIOLOGY WITH ANESTHESIA N/A 08/07/2020   Procedure: IR WITH ANESTHESIA;  Surgeon: Luanne Bras, MD;  Location: [facility];  Service: Radiology;  Laterality: N/A;   TEE WITHOUT CARDIOVERSION N/A 12/18/2020   Procedure: TRANSESOPHAGEAL ECHOCARDIOGRAM (TEE);  Surgeon: Larey Dresser, MD;  Location: [facility] ENDOSCOPY;  Service: Cardiovascular;  Laterality: N/A;   [place] Right 04/03/2018   Procedure: TOTAL HIP ARTHROPLASTY ANTERIOR APPROACH;  Surgeon: Paralee Cancel, MD;  Location: [facility];  Service: Orthopedics;  Laterality: Right;  70 mins    Allergies  Allergen Reactions   Naproxen Hives   No [facility] Food  Allergies Other (See Comments)    Scallops - distress, nausea and vomitting    Allergies as of 09/06/2021       Reactions   Naproxen Hives   No [facility] Food Allergies Other (See Comments)   Scallops - distress, nausea and vomitting        Medication List        Accurate as of September 06, 2021  2:46 PM. If you have any questions, ask your nurse or doctor.          STOP taking these medications    Claritin 10 MG tablet Generic drug: loratadine Stopped by: Royal Hawthorn, NP   nystatin powder Commonly known as: MYCOSTATIN/NYSTOP Stopped by: Royal Hawthorn, NP       TAKE these medications    acetaminophen 325 MG tablet Commonly known as: TYLENOL Take 650  mg by mouth as needed for mild pain.   amantadine 100 MG capsule Commonly known as: SYMMETREL Take 100 mg by mouth in the morning and at bedtime. Give one tab with breakfast and lunch   amLODipine 5 MG tablet Commonly known as: NORVASC Take 5 mg by mouth daily.   amoxicillin 400 MG/5ML suspension Commonly known as: AMOXIL Take 6.3 mLs (500 mg total) by mouth 2 (two) times daily.   apixaban 5 MG Tabs tablet Commonly known as: ELIQUIS Take 1 tablet (5 mg total) by mouth 2 (two) times daily.   atorvastatin 80 MG tablet Commonly known as: LIPITOR Take 1 tablet (80 mg total) by mouth daily.   BIOTENE MOISTURIZING MOUTH MT Use as directed 2 sprays in the mouth or throat in the morning, at noon, and at bedtime.   bisacodyl 10 MG suppository Commonly known as: DULCOLAX Place 1 suppository (10 mg total) rectally daily as needed for moderate constipation or mild constipation.   clobetasol 0.05 % external solution Commonly known as: TEMOVATE Apply 1 application  topically as needed (itching).   dofetilide 125 MCG capsule Commonly known as: TIKOSYN Take 3 capsules (375 mcg total) by mouth 2 (two) times daily.   famotidine 20 MG tablet Commonly known as: PEPCID Take 20 mg by mouth at bedtime as needed for  indigestion.   finasteride 5 MG tablet Commonly known as: PROSCAR Take 1 tablet (5 mg total) by mouth daily.   hydrocortisone 2.5 % cream Apply 1 application. topically daily as needed (itching).   lacosamide 50 MG Tabs tablet Commonly known as: VIMPAT Take 1 tablet (50 mg total) by mouth 2 (two) times daily.   levalbuterol 0.63 MG/3ML nebulizer solution Commonly known as: XOPENEX Take 0.63 mg by nebulization every 6 (six) hours as needed for wheezing or shortness of breath.   magnesium gluconate 500 MG tablet Commonly known as: MAGONATE Take 1 tablet (500 mg total) by mouth daily.   methenamine 1 g tablet Commonly known as: HIPREX Take 1 g by mouth every 12 (twelve) hours.   multivitamin with minerals Tabs tablet Take 1 tablet by mouth daily.   polyethylene glycol 17 g packet Commonly known as: MIRALAX / GLYCOLAX Take 17 g by mouth every other day.   potassium chloride 10 MEQ tablet Commonly known as: KLOR-CON M Take 1 tablet (10 mEq total) by mouth daily.   senna-docusate 8.6-50 MG tablet Commonly known as: Senokot-S Take 1 tablet by mouth at bedtime as needed.   Systane 0.4-0.3 % Soln Generic drug: Polyethyl Glycol-Propyl Glycol Place 2 drops into both eyes daily.   Valtoco 15 MG Dose 2 x 7.5 MG/0.1ML Lqpk Generic drug: diazePAM (15 MG Dose) Place 7.5 mg into each nostril (total of 15 mg) for grand mal seizure lasting over 5 minutes. Can repeat once in 4 hours. Do Not use more than twice in 24 hours        Review of Systems  Unable to perform ROS: Patient nonverbal    Immunization History  Administered Date(s) Administered   Fluad Quad(high Dose 65+) 12/06/2018, 12/09/2020   Influenza Whole 12/25/2008, 01/05/2010, 12/30/2011   Influenza, High Dose Seasonal PF 12/14/2016   Influenza,inj,Quad PF,6+ Mos 12/19/2013   Influenza,inj,quad, With Preservative 11/22/2017   Influenza-Unspecified 12/14/2015, 12/24/2019   Moderna Covid-19 Vaccine Bivalent Booster  55yr & up 01/12/2021   Moderna SARS-COV2 Booster Vaccination 01/02/2020, 06/26/2020   Moderna Sars-Covid-2 Vaccination 04/08/2019, 05/05/2019   Pneumococcal Conjugate-13 12/19/2013   Pneumococcal Polysaccharide-23 09/24/2007, 05/22/2017  Td 12/25/2008   Tdap 03/22/2018   Zoster Recombinat (Shingrix) 07/16/2019, 10/28/2019   Zoster, Live 09/24/2007   Pertinent  Health Maintenance Due  Topic Date Due   FOOT EXAM  Never done   OPHTHALMOLOGY EXAM  Never done   URINE MICROALBUMIN  Never done   INFLUENZA VACCINE  10/05/2021   HEMOGLOBIN A1C  01/05/2022   COLONOSCOPY (Pts 45-32yr Insurance coverage will need to be confirmed)  Discontinued      08/10/2021    9:36 PM 08/10/2021   11:10 PM 08/11/2021    8:00 AM 08/11/2021    7:30 PM 08/12/2021    8:00 AM  Fall Risk  Patient Fall Risk Level High fall risk High fall risk High fall risk High fall risk High fall risk   Functional Status Survey:    Vitals:   09/06/21 1432  BP: 110/72  Pulse: 71  Resp: (!) 22  Temp: (!) 97.3 F (36.3 C)  SpO2: 98%  Weight: 146 lb 12.8 oz (66.6 kg)  Height: '5\' 7"'$  (1.702 m)   Body mass index is 22.99 kg/m. Physical Exam Vitals and nursing note reviewed.  Constitutional:      General: He is not in acute distress.    Appearance: He is not toxic-appearing.  HENT:     Mouth/Throat:     Mouth: Mucous membranes are dry.  Cardiovascular:     Rate and Rhythm: Normal rate and regular rhythm.     Heart sounds: No murmur heard. Pulmonary:     Effort: Pulmonary effort is normal.     Breath sounds: Normal breath sounds.  Abdominal:     General: There is distension.     Palpations: There is no mass.     Tenderness: There is no right CVA tenderness or left CVA tenderness.     Comments: Bowels sounds are present but hypoactive x 4.  Slight firms noted at the RUQ and RLQ  Musculoskeletal:     Comments: Ankle edema +1 on right, trace on left.   Skin:    General: Skin is warm and dry.     Coloration: Skin is  pale.     Comments: Left index finger nail bed with mild erythema no purulent matter.   Neurological:     Comments: Eyes closed, intermittently able to f/c, non verbal     Labs reviewed: Recent Labs    12/18/20 1630 12/19/20 0610 12/19/20 1632 12/20/20 0153 02/08/21 0000 02/09/21 0000 08/10/21 0937 08/10/21 0957 08/11/21 0408 08/12/21 0107 08/16/21 0000 08/23/21 0000 08/26/21 0000 08/30/21 0000  NA  --  139  --    < > 150*   < > 141   < > 140 142   < > 148* 140 141  K  --  3.5  --    < > 3.7   < > 3.8   < > 3.6 3.9   < > 4.3 4.0 4.2  CL  --  108  --    < > 107   < > 111  --  109 113*   < > 110* 106 107  CO2  --  23  --    < > 22   < > 16*  --  24 23   < > 27* 24* 23*  GLUCOSE  --  186*  --    < >  --    < > 148*  --  120* 143*  --   --   --   --  BUN  --  16  --    < > 23*   < > 22  --  17 17   < > 25* 27* 17  CREATININE  --  0.76  --    < > 0.9   < > 1.18  --  0.81 0.83   < > 0.8 0.6 0.7  CALCIUM  --  7.7*  --    < > 9.9   < > 8.8*  --  8.6* 8.7*   < > 9.4 9.2 9.2  MG 1.9 2.0 1.9   < > 2.1  --   --   --  1.9 2.0  --   --   --   --   PHOS 3.1 2.7 2.4*  --   --   --   --   --   --   --   --   --   --   --    < > = values in this interval not displayed.   Recent Labs    04/19/21 0000 05/11/21 1132 07/12/21 1056 08/10/21 0937 08/11/21 0408  AST 23   < > '22 29 27  '$ ALT 20   < > '22 26 23  '$ ALKPHOS 175*  --   --  93 80  BILITOT  --    < > 0.7 0.5 0.8  PROT  --    < > 7.2 6.4* 5.8*  ALBUMIN 3.6  --   --  3.3* 3.1*   < > = values in this interval not displayed.   Recent Labs    07/12/21 1056 08/10/21 0937 08/10/21 0957 08/11/21 0408 08/12/21 0107  WBC 9.0 16.1*  --  9.1 10.2  NEUTROABS 6,804 14.3*  --   --  7.8*  HGB 10.9* 10.0* 9.9* 9.4* 9.4*  HCT 33.9* 31.6* 29.0* 28.7* 29.8*  MCV 90.4 96.3  --  92.0 94.3  PLT 426* 238  --  218 227   Lab Results  Component Value Date   TSH 1.773 01/24/2021   Lab Results  Component Value Date   HGBA1C 5.7 07/05/2021    Lab Results  Component Value Date   CHOL 110 07/05/2021   HDL 43 07/05/2021   LDLCALC 57 07/05/2021   TRIG 51 07/05/2021   CHOLHDL 2.8 08/08/2020    Significant Diagnostic Results in last 30 days:  EEG adult  Result Date: 08/10/2021 Lora Havens, MD     08/10/2021  5:38 PM Patient Name: MANUEL DALL MRN: [id number] Epilepsy Attending: Lora Havens Referring Physician/Provider: Norval Morton, MD Date: 08/10/2021 Duration: 24.01 mins Patient history: 81 year old male with breakthrough seizure in the setting of possible bronchitis or pneumonia. EEG to evaluate for seizure. Level of alertness:  lethargic AEDs during EEG study: LCM Technical aspects: This EEG study was done with scalp electrodes positioned according to the 10-20 International system of electrode placement. Electrical activity was acquired at a sampling rate of '500Hz'$  and reviewed with a high frequency filter of '70Hz'$  and a low frequency filter of '1Hz'$ . EEG data were recorded continuously and digitally stored. Description: No clear posterior dominant rhythm was seen. EEG showed continuous 5 to 8 Hz theta-alpha activity in left hemisphere. There is low amplitude 2-'3hz'$  delta slowing in right hemisphere.   Hyperventilation and photic stimulation were not performed.   ABNORMALITY - Continuous slow, generalized and lateralized right hemisphere IMPRESSION: This study is suggestive of cortical dysfunction arising from right hemisphere likely secondary to underlying structural  abnormality. Additionally there is moderate diffuse encephalopathy, nonspecific etiology. No seizures or epileptiform discharges were seen throughout the recording. Lora Havens   CT Head Wo Contrast  Result Date: 08/10/2021 CLINICAL DATA:  Mental status changes. EXAM: CT HEAD WITHOUT CONTRAST TECHNIQUE: Contiguous axial images were obtained from the base of the skull through the vertex without intravenous contrast. RADIATION DOSE REDUCTION: This exam was  performed according to the departmental dose-optimization program which includes automated exposure control, adjustment of the mA and/or kV according to patient size and/or use of iterative reconstruction technique. COMPARISON:  01/23/2021.  12/14/2020. FINDINGS: Brain: There is no evidence for acute hemorrhage, hydrocephalus, mass lesion, or abnormal extra-axial fluid collection. No definite CT evidence for acute infarction. Diffuse loss of parenchymal volume is consistent with atrophy. Patchy low attenuation in the deep hemispheric and periventricular white matter is nonspecific, but likely reflects chronic microvascular ischemic demyelination. Global ventriculomegaly likely a combination of atrophy and previous infarcts. Stable appearance right frontotemporal parietal encephalomalacia consistent with MCA territory infarct. Encephalomalacia in the left parietal lobe is stable. Vascular: No hyperdense vessel or unexpected calcification. Skull: Right craniotomy defect again noted. Sinuses/Orbits: The visualized paranasal sinuses and mastoid air cells are clear. Visualized portions of the globes and intraorbital fat are unremarkable. Other: None. IMPRESSION: 1. No acute intracranial abnormality. 2. 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 3. Atrophy with chronic small vessel white matter ischemic disease. Electronically Signed   By: Misty Stanley M.D.   On: 08/10/2021 11:44   DG Chest Port 1 View  Result Date: 08/10/2021 CLINICAL DATA:  Altered mental status. EXAM: PORTABLE CHEST 1 VIEW COMPARISON:  January 23, 2021. FINDINGS: Patchy bilateral airspace opacities are mildly improved. Visible pleural effusions or pneumothorax. Similar cardiomediastinal silhouette. Cardiac valve replacements. Median sternotomy. No evidence of acute osseous abnormality. IMPRESSION: Patchy bilateral airspace opacities are mildly improved. Electronically Signed   By: Margaretha Sheffield M.D.   On: 08/10/2021 10:16     Assessment/Plan  1. Abdominal distention Some improvement noted with BM No vomiting or fever Would send to the ER if vomiting or fever develops, order written to facility  Will check KUB ?if this is excess air from bipap vs ileus  Check BMP hepatic function panel and CBC with diff  2. Aspiration pneumonia of both lower lobes due to regurgitated food ([place]) Resolved, due for f/u xray   3. Infection of nail bed of finger of left hand Apply bactroban bid x 7 days Warm epsom salt soak or compresses 15 min tid until resolves.  Already on amoxicillin indefinitely    Family/ staff Communication: Herbert Pun  Labs/tests ordered:   as above.

## 2021-09-07 DIAGNOSIS — R41841 Cognitive communication deficit: Secondary | ICD-10-CM | POA: Diagnosis not present

## 2021-09-07 DIAGNOSIS — I69354 Hemiplegia and hemiparesis following cerebral infarction affecting left non-dominant side: Secondary | ICD-10-CM | POA: Diagnosis not present

## 2021-09-07 DIAGNOSIS — R140 Abdominal distension (gaseous): Secondary | ICD-10-CM | POA: Diagnosis not present

## 2021-09-07 DIAGNOSIS — R059 Cough, unspecified: Secondary | ICD-10-CM | POA: Diagnosis not present

## 2021-09-07 DIAGNOSIS — I69391 Dysphagia following cerebral infarction: Secondary | ICD-10-CM | POA: Diagnosis not present

## 2021-09-07 DIAGNOSIS — G4089 Other seizures: Secondary | ICD-10-CM | POA: Diagnosis not present

## 2021-09-07 LAB — HEPATIC FUNCTION PANEL
ALT: 18 U/L (ref 10–40)
AST: 24 (ref 14–40)
Alkaline Phosphatase: 105 (ref 25–125)
Bilirubin, Total: 0.6

## 2021-09-07 LAB — CBC AND DIFFERENTIAL
HCT: 31 — AB (ref 41–53)
Hemoglobin: 10.2 — AB (ref 13.5–17.5)
Platelets: 289 10*3/uL (ref 150–400)
WBC: 12.6

## 2021-09-07 LAB — COMPREHENSIVE METABOLIC PANEL: Albumin: 3.5 (ref 3.5–5.0)

## 2021-09-07 LAB — CBC: RBC: 3.45 — AB (ref 3.87–5.11)

## 2021-09-08 ENCOUNTER — Ambulatory Visit: Payer: Medicare Other | Admitting: Physical Medicine & Rehabilitation

## 2021-09-08 DIAGNOSIS — R278 Other lack of coordination: Secondary | ICD-10-CM | POA: Diagnosis not present

## 2021-09-08 DIAGNOSIS — I5022 Chronic systolic (congestive) heart failure: Secondary | ICD-10-CM | POA: Diagnosis not present

## 2021-09-08 DIAGNOSIS — S065X3S Traumatic subdural hemorrhage with loss of consciousness of 1 hour to 5 hours 59 minutes, sequela: Secondary | ICD-10-CM | POA: Diagnosis not present

## 2021-09-08 DIAGNOSIS — R140 Abdominal distension (gaseous): Secondary | ICD-10-CM | POA: Diagnosis not present

## 2021-09-08 DIAGNOSIS — R41841 Cognitive communication deficit: Secondary | ICD-10-CM | POA: Diagnosis not present

## 2021-09-08 DIAGNOSIS — I69812 Visuospatial deficit and spatial neglect following other cerebrovascular disease: Secondary | ICD-10-CM | POA: Diagnosis not present

## 2021-09-08 DIAGNOSIS — R293 Abnormal posture: Secondary | ICD-10-CM | POA: Diagnosis not present

## 2021-09-08 DIAGNOSIS — Z79899 Other long term (current) drug therapy: Secondary | ICD-10-CM | POA: Diagnosis not present

## 2021-09-08 DIAGNOSIS — M6389 Disorders of muscle in diseases classified elsewhere, multiple sites: Secondary | ICD-10-CM | POA: Diagnosis not present

## 2021-09-08 DIAGNOSIS — I69391 Dysphagia following cerebral infarction: Secondary | ICD-10-CM | POA: Diagnosis not present

## 2021-09-08 DIAGNOSIS — I69354 Hemiplegia and hemiparesis following cerebral infarction affecting left non-dominant side: Secondary | ICD-10-CM | POA: Diagnosis not present

## 2021-09-08 DIAGNOSIS — G4089 Other seizures: Secondary | ICD-10-CM | POA: Diagnosis not present

## 2021-09-08 DIAGNOSIS — R41840 Attention and concentration deficit: Secondary | ICD-10-CM | POA: Diagnosis not present

## 2021-09-08 DIAGNOSIS — H538 Other visual disturbances: Secondary | ICD-10-CM | POA: Diagnosis not present

## 2021-09-08 LAB — COMPREHENSIVE METABOLIC PANEL: Albumin: 3.5 (ref 3.5–5.0)

## 2021-09-08 LAB — HEPATIC FUNCTION PANEL
ALT: 18 U/L (ref 10–40)
AST: 24 (ref 14–40)
Alkaline Phosphatase: 105 (ref 25–125)
Bilirubin, Direct: 0.2 (ref 0.01–0.4)
Bilirubin, Total: 0.6

## 2021-09-08 NOTE — Telephone Encounter (Signed)
Pt's wife, Schuyler Olden (on DPR) BIPAP was put on medical hold for 2 days to determine if was causing medical issue. Determined BIPAP was not the cause. Facility requesting a order to begin BIPAP with perimeters. Would like a call from the nurse to confirm understanding to do an order today.  Fax# to send order: [phone]

## 2021-09-08 NOTE — Telephone Encounter (Signed)
Orders were sent to [facility] for the patient as well but I have forwarded the updated order to the fax number provided by wife.

## 2021-09-09 ENCOUNTER — Encounter: Payer: Self-pay | Admitting: Adult Health

## 2021-09-09 ENCOUNTER — Non-Acute Institutional Stay (SKILLED_NURSING_FACILITY): Payer: Medicare Other | Admitting: Adult Health

## 2021-09-09 DIAGNOSIS — R293 Abnormal posture: Secondary | ICD-10-CM | POA: Diagnosis not present

## 2021-09-09 DIAGNOSIS — E870 Hyperosmolality and hypernatremia: Secondary | ICD-10-CM | POA: Diagnosis not present

## 2021-09-09 DIAGNOSIS — R140 Abdominal distension (gaseous): Secondary | ICD-10-CM

## 2021-09-09 DIAGNOSIS — D72829 Elevated white blood cell count, unspecified: Secondary | ICD-10-CM

## 2021-09-09 DIAGNOSIS — E118 Type 2 diabetes mellitus with unspecified complications: Secondary | ICD-10-CM

## 2021-09-09 DIAGNOSIS — I5022 Chronic systolic (congestive) heart failure: Secondary | ICD-10-CM | POA: Diagnosis not present

## 2021-09-09 DIAGNOSIS — I69354 Hemiplegia and hemiparesis following cerebral infarction affecting left non-dominant side: Secondary | ICD-10-CM | POA: Diagnosis not present

## 2021-09-09 DIAGNOSIS — R41840 Attention and concentration deficit: Secondary | ICD-10-CM | POA: Diagnosis not present

## 2021-09-09 DIAGNOSIS — R195 Other fecal abnormalities: Secondary | ICD-10-CM

## 2021-09-09 DIAGNOSIS — M6389 Disorders of muscle in diseases classified elsewhere, multiple sites: Secondary | ICD-10-CM | POA: Diagnosis not present

## 2021-09-09 DIAGNOSIS — R569 Unspecified convulsions: Secondary | ICD-10-CM | POA: Diagnosis not present

## 2021-09-09 DIAGNOSIS — I480 Paroxysmal atrial fibrillation: Secondary | ICD-10-CM

## 2021-09-09 DIAGNOSIS — H538 Other visual disturbances: Secondary | ICD-10-CM | POA: Diagnosis not present

## 2021-09-09 DIAGNOSIS — I38 Endocarditis, valve unspecified: Secondary | ICD-10-CM | POA: Diagnosis not present

## 2021-09-09 DIAGNOSIS — I69391 Dysphagia following cerebral infarction: Secondary | ICD-10-CM | POA: Diagnosis not present

## 2021-09-09 DIAGNOSIS — R41841 Cognitive communication deficit: Secondary | ICD-10-CM | POA: Diagnosis not present

## 2021-09-09 DIAGNOSIS — T826XXD Infection and inflammatory reaction due to cardiac valve prosthesis, subsequent encounter: Secondary | ICD-10-CM

## 2021-09-09 DIAGNOSIS — N138 Other obstructive and reflux uropathy: Secondary | ICD-10-CM | POA: Diagnosis not present

## 2021-09-09 DIAGNOSIS — S065X3S Traumatic subdural hemorrhage with loss of consciousness of 1 hour to 5 hours 59 minutes, sequela: Secondary | ICD-10-CM | POA: Diagnosis not present

## 2021-09-09 DIAGNOSIS — N401 Enlarged prostate with lower urinary tract symptoms: Secondary | ICD-10-CM | POA: Diagnosis not present

## 2021-09-09 DIAGNOSIS — G4089 Other seizures: Secondary | ICD-10-CM | POA: Diagnosis not present

## 2021-09-09 DIAGNOSIS — I69812 Visuospatial deficit and spatial neglect following other cerebrovascular disease: Secondary | ICD-10-CM | POA: Diagnosis not present

## 2021-09-09 DIAGNOSIS — R278 Other lack of coordination: Secondary | ICD-10-CM | POA: Diagnosis not present

## 2021-09-09 LAB — BASIC METABOLIC PANEL
BUN: 29 — AB (ref 4–21)
CO2: 23 — AB (ref 13–22)
Chloride: 105 (ref 99–108)
Creatinine: 0.6 (ref 0.6–1.3)
Glucose: 128
Potassium: 3.6 mEq/L (ref 3.5–5.1)
Sodium: 139 (ref 137–147)

## 2021-09-09 LAB — COMPREHENSIVE METABOLIC PANEL: Calcium: 9 (ref 8.7–10.7)

## 2021-09-09 NOTE — Progress Notes (Signed)
Location:  [facility] Room Number: 237-S Place of Service:  SNF [phone]) Provider:  Earney Hamburg, MD  Patient Care Team: [name], MD as PCP - General (Internal Medicine) Larey Dresser, MD as PCP - Cardiology (Cardiology) [name], MD as Consulting Physician (Ophthalmology) [name], MD (Internal Medicine)  Extended Emergency Contact Information Primary Emergency Contact: [name] Address: [address]          [place], [place] [address] [place] Phone: [phone] Mobile Phone: [phone] Relation: Spouse Secondary Emergency Contact: [name], [place] [address] [name] of [place] Phone: [phone] Relation: Son  Code Status:  DNR Goals of care: Advanced Directive information    09/09/2021    8:40 AM  Advanced Directives  Does Patient Have a Medical Advance Directive? Yes  Type of Paramedic of [place];Living will;Out of facility DNR (pink MOST or yellow form)  Does patient want to make changes to medical advance directive? No - Patient declined  Copy of [facility] in Chart? Yes - validated most recent copy scanned in chart (See row information)     Chief Complaint  Patient presents with   Acute Visit    Abdominal Distention     HPI:  Pt is a 81 y.o. male seen today for an acute visit for abd distention and for medical management of chronic diseases.  PMH includes syncope with fall in May of 2022, TBI with SDH with craniotomy, acute right MCA s/p thrombectomy, bioprosthetic mitral and aortic valve replacement, DM II, diastiolic CHF, BPH, and afib.   He was seen on 09/06/21 due to abd distention without nausea, vomiting or fever.  KUB 7/3 showed: no ileus or free air. Constipation was noted. Non specific bowel loops.  He was given supp and bipap was placed on on hold.   Now having  several BMs. Eating and drinking well. He had a ring of blood in his brief around the stool not in the stool. Has hx of hemorrhoids. Hemoccult ordered and was done on three consecutive shifts and were all three positive. No blood in stool was noted. No black stool.   NA was 147 7/3.  Hepatic function panel WNL. WBC 12.6 Hgb 10.2 Plt 289  Fluids encouraged. Repeat pending.   F/U CXR 7/3 ordered due aspiration pna treated with augmentin after a hospitalization in June. Which showed vascular congestion possible CHF with bibasilar infiltrate. He is down 3-4 lbs. Has trace edema and no sob. No cough or sputum production.   He is using the stand up lift to go to the BR.  Sleeps much of the day.   Remains on amoxicillin indefinitely for hx of endocarditis.   On Eliquis due to hx of afib. Rate has been controlled.   No seizures since his hospitalization last month.   [place] can 189 cc on 7/3. Voiding well per staff. Has order to cath prn. Not currently needing.    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    Other and unspecified hyperlipidemia    Pre-diabetes    Previous back surgery 1978, jan 2007   Psychosexual dysfunction with inhibited sexual excitement    SOB (shortness of breath)    "with heavy exercise"   Stroke ([place] [facility]) 08/2013   "I WAS IN AFIB AND THREW A CLOT, THE EFFECTS WERE TRANSITORY AND DIDNT LAST BUT FOR 30 MINUTES"    Thoracic aortic aneurysm [facility])    Past Surgical History:  Procedure  Laterality Date   AORTIC AND MITRAL VALVE REPLACEMENT     09/2004   CARDIOVERSION     3 times from 2004-2006   CARDIOVERSION N/A 09/26/2013   Procedure: CARDIOVERSION;  Surgeon: Larey Dresser, MD;  Location: [facility];  Service: Cardiovascular;  Laterality: N/A;   CARDIOVERSION N/A 06/19/2014   Procedure: CARDIOVERSION;  Surgeon: Jerline Pain, MD;  Location: [facility];  Service: Cardiovascular;  Laterality: N/A;   CARDIOVERSION N/A 04/24/2017   Procedure: CARDIOVERSION;  Surgeon: Larey Dresser, MD;  Location: [facility];  Service: Cardiovascular;  Laterality: N/A;   CARDIOVERSION N/A 06/08/2017   Procedure: CARDIOVERSION;  Surgeon: Pixie Casino, MD;  Location: [facility] ENDOSCOPY;  Service: Cardiovascular;  Laterality: N/A;   CARDIOVERSION N/A 08/24/2017   Procedure: CARDIOVERSION;  Surgeon: Skeet Latch, MD;  Location: [facility] ENDOSCOPY;  Service: Cardiovascular;  Laterality: N/A;   CARDIOVERSION N/A 02/14/2018   Procedure: CARDIOVERSION;  Surgeon: Larey Dresser, MD;  Location: [facility] ENDOSCOPY;  Service: Cardiovascular;  Laterality: N/A;   COLONOSCOPY     CRANIOTOMY N/A 07/28/2020   Procedure: CRANIOTOMY FOR EVACUATION OF SUBDURAL HEMATOMA;  Surgeon: Eustace Inghram, MD;  Location: [facility];  Service: Neurosurgery;  Laterality: N/A;   IR ANGIO VERTEBRAL SEL SUBCLAVIAN INNOMINATE UNI R MOD SED  08/07/2020   IR CT HEAD LTD  08/07/2020   IR PERCUTANEOUS ART THROMBECTOMY/INFUSION INTRACRANIAL INC DIAG ANGIO  08/07/2020   laminectomies     10/1975 and in 03/2005   RADIOLOGY WITH ANESTHESIA N/A 08/07/2020   Procedure: IR WITH ANESTHESIA;  Surgeon: Luanne Bras, MD;  Location: [facility];  Service: Radiology;  Laterality: N/A;   TEE WITHOUT CARDIOVERSION N/A 12/18/2020   Procedure: TRANSESOPHAGEAL ECHOCARDIOGRAM (TEE);  Surgeon: Larey Dresser, MD;  Location: [name] ENDOSCOPY;  Service: Cardiovascular;  Laterality: N/A;   [place] Right 04/03/2018   Procedure:  TOTAL HIP ARTHROPLASTY ANTERIOR APPROACH;  Surgeon: Paralee Cancel, MD;  Location: [facility];  Service: Orthopedics;  Laterality: Right;  70 mins    Allergies  Allergen Reactions   Naproxen Hives   No [facility] Food Allergies Other (See Comments)    Scallops - distress, nausea and vomitting    Outpatient Encounter Medications as of 09/09/2021  Medication Sig   acetaminophen (TYLENOL) 325 MG tablet Take 650 mg by mouth as needed for mild pain.   amantadine (SYMMETREL) 100 MG capsule Take 100 mg by mouth in the morning and at bedtime. Give one tab with breakfast and lunch   amLODipine (NORVASC) 5 MG tablet Take 5 mg by mouth daily.   amoxicillin (AMOXIL) 500 MG capsule Take 500 mg by mouth 2 (two) times daily.  *NO STOP DATE* - per pharmacy can open capsule -Do not crush  apixaban (ELIQUIS) 5 MG TABS tablet Take 1 tablet (5 mg total) by mouth 2 (two) times daily.   Artificial Saliva (BIOTENE MOISTURIZING MOUTH MT) Use as directed 2 sprays in the mouth or throat in the morning, at noon, and at bedtime.   atorvastatin (LIPITOR) 80 MG tablet Take 1 tablet (80 mg total) by mouth daily.   bisacodyl (DULCOLAX) 10 MG suppository Place 1 suppository (10 mg total) rectally daily as needed for moderate constipation or mild constipation.   clobetasol (TEMOVATE) 0.05 % external solution Apply 1 application  topically as needed (itching).   diazePAM, 15 MG Dose, (VALTOCO 15 MG DOSE) 2 x 7.5 MG/0.1ML LQPK Place 7.5 mg into each nostril (total of 15 mg) for grand mal seizure lasting over 5 minutes. Can repeat once in 4 hours. Do Not use more than twice in 24 hours   dofetilide (TIKOSYN) 125 MCG capsule Take 3 capsules (375 mcg total) by mouth 2 (two) times daily.   famotidine (PEPCID) 20 MG tablet Take 20 mg by mouth at bedtime as needed for indigestion.   finasteride (PROSCAR) 5 MG tablet Take 1 tablet (5 mg total) by mouth daily.   hydrocortisone 2.5 % cream Apply 1 application. topically daily as needed  (itching).   lacosamide (VIMPAT) 50 MG TABS tablet Take 1 tablet (50 mg total) by mouth 2 (two) times daily.   levalbuterol (XOPENEX) 0.63 MG/3ML nebulizer solution Take 0.63 mg by nebulization every 6 (six) hours as needed for wheezing or shortness of breath.   magnesium gluconate (MAGONATE) 500 MG tablet Take 1 tablet (500 mg total) by mouth daily.   methenamine (HIPREX) 1 g tablet Take 1 g by mouth every 12 (twelve) hours.   Multiple Vitamin (MULTIVITAMIN WITH MINERALS) TABS tablet Take 1 tablet by mouth daily.   Polyethyl Glycol-Propyl Glycol (SYSTANE) 0.4-0.3 % SOLN Place 2 drops into both eyes daily.   polyethylene glycol (MIRALAX / GLYCOLAX) 17 g packet Take 17 g by mouth every other day.   potassium chloride (KLOR-CON M) 10 MEQ tablet Take 1 tablet (10 mEq total) by mouth daily.   senna-docusate (SENOKOT-S) 8.6-50 MG tablet Take 1 tablet by mouth at bedtime as needed.   [DISCONTINUED] amoxicillin (AMOXIL) 400 MG/5ML suspension Take 6.3 mLs (500 mg total) by mouth 2 (two) times daily.   No facility-administered encounter medications on file as of 09/09/2021.    Review of Systems  Unable to perform ROS: Patient nonverbal    Immunization History  Administered Date(s) Administered   Fluad Quad(high Dose 65+) 12/06/2018, 12/09/2020   Influenza Whole 12/25/2008, 01/05/2010, 12/30/2011   Influenza, High Dose Seasonal PF 12/14/2016   Influenza,inj,Quad PF,6+ Mos 12/19/2013   Influenza,inj,quad, With Preservative 11/22/2017   Influenza-Unspecified 12/14/2015, 12/24/2019   Moderna Covid-19 Vaccine Bivalent Booster 37yr & up 01/12/2021   Moderna SARS-COV2 Booster Vaccination 01/02/2020, 06/26/2020   Moderna Sars-Covid-2 Vaccination 04/08/2019, 05/05/2019   Pneumococcal Conjugate-13 12/19/2013   Pneumococcal Polysaccharide-23 09/24/2007, 05/22/2017   Td 12/25/2008   Tdap 03/22/2018   Zoster Recombinat (Shingrix) 07/16/2019, 10/28/2019   Zoster, Live 09/24/2007   Pertinent  Health  Maintenance Due  Topic Date Due   FOOT EXAM  Never done   OPHTHALMOLOGY EXAM  Never done   URINE MICROALBUMIN  Never done   INFLUENZA VACCINE  10/05/2021   HEMOGLOBIN A1C  01/05/2022   COLONOSCOPY (Pts 45-454yrInsurance coverage will need to be confirmed)  Discontinued      08/10/2021    9:36 PM 08/10/2021   11:10  PM 08/11/2021    8:00 AM 08/11/2021    7:30 PM 08/12/2021    8:00 AM  Fall Risk  Patient Fall Risk Level High fall risk High fall risk High fall risk High fall risk High fall risk   Functional Status Survey:    Vitals:   09/09/21 0834  BP: 124/85  Pulse: 89  Resp: 16  Temp: (!) 97.5 F (36.4 C)  SpO2: 97%  Weight: 145 lb 3.2 oz (65.9 kg)  Height: '5\' 7"'$  (1.702 m)   Body mass index is 22.74 kg/m. Physical Exam Vitals and nursing note reviewed.  Constitutional:      General: He is not in acute distress.    Appearance: He is not diaphoretic.  HENT:     Head: Normocephalic and atraumatic.     Mouth/Throat:     Mouth: Mucous membranes are moist.     Pharynx: Oropharynx is clear.  Neck:     Thyroid: No thyromegaly.     Vascular: No JVD.     Trachea: No tracheal deviation.  Cardiovascular:     Rate and Rhythm: Normal rate and regular rhythm.     Heart sounds: No murmur heard. Pulmonary:     Effort: Pulmonary effort is normal. No respiratory distress.     Breath sounds: Normal breath sounds. No wheezing.  Abdominal:     General: Bowel sounds are normal. There is no distension (noted but much improved, slight firmness on the right.).     Palpations: Abdomen is soft.     Tenderness: There is no abdominal tenderness.  Musculoskeletal:     Comments: Right trace ankle edema. LLE no edema.   Lymphadenopathy:     Cervical: No cervical adenopathy.  Skin:    General: Skin is warm and dry.  Neurological:     Mental Status: He is alert.     Comments: Left sided weakness. Keeps eyes closed. Not verbal today. Intermittently able to f/c.       Labs reviewed: Recent  Labs    12/18/20 1630 12/19/20 0610 12/19/20 1632 12/20/20 0153 02/08/21 0000 02/09/21 0000 08/10/21 2536 08/10/21 0957 08/11/21 0408 08/12/21 0107 08/16/21 0000 08/26/21 0000 08/30/21 0000 09/06/21 0000  NA  --  139  --    < > 150*   < > 141   < > 140 142   < > 140 141 147  K  --  3.5  --    < > 3.7   < > 3.8   < > 3.6 3.9   < > 4.0 4.2 4.0  CL  --  108  --    < > 107   < > 111  --  109 113*   < > 106 107 111*  CO2  --  23  --    < > 22   < > 16*  --  24 23   < > 24* 23* 25*  GLUCOSE  --  186*  --    < >  --    < > 148*  --  120* 143*  --   --   --   --   BUN  --  16  --    < > 23*   < > 22  --  17 17   < > 27* 17 25*  CREATININE  --  0.76  --    < > 0.9   < > 1.18  --  0.81 0.83   < >  0.6 0.7 0.7  CALCIUM  --  7.7*  --    < > 9.9   < > 8.8*  --  8.6* 8.7*   < > 9.2 9.2 9.5  MG 1.9 2.0 1.9   < > 2.1  --   --   --  1.9 2.0  --   --   --   --   PHOS 3.1 2.7 2.4*  --   --   --   --   --   --   --   --   --   --   --    < > = values in this interval not displayed.   Recent Labs    07/12/21 1056 08/10/21 0937 08/11/21 0408 09/07/21 0000  AST '22 29 27 24  '$ ALT '22 26 23 18  '$ ALKPHOS  --  93 80 105  BILITOT 0.7 0.5 0.8  --   PROT 7.2 6.4* 5.8*  --   ALBUMIN  --  3.3* 3.1* 3.5   Recent Labs    07/12/21 1056 08/10/21 0937 08/10/21 0957 08/11/21 0408 08/12/21 0107 09/07/21 0000  WBC 9.0 16.1*  --  9.1 10.2 12.6  NEUTROABS 6,804 14.3*  --   --  7.8*  --   HGB 10.9* 10.0*   < > 9.4* 9.4* 10.2*  HCT 33.9* 31.6*   < > 28.7* 29.8* 31*  MCV 90.4 96.3  --  92.0 94.3  --   PLT 426* 238  --  218 227 289   < > = values in this interval not displayed.   Lab Results  Component Value Date   TSH 1.773 01/24/2021   Lab Results  Component Value Date   HGBA1C 5.7 07/05/2021   Lab Results  Component Value Date   CHOL 110 07/05/2021   HDL 43 07/05/2021   LDLCALC 57 07/05/2021   TRIG 51 07/05/2021   CHOLHDL 2.8 08/08/2020    Significant Diagnostic Results in last 30 days:   EEG adult  Result Date: 08/10/2021 Lora Havens, MD     08/10/2021  5:38 PM Patient Name: [name] [name] MRN: [id number] Epilepsy Attending: Lora Havens Referring Physician/Provider: Norval Morton, MD Date: 08/10/2021 Duration: 24.01 mins Patient history: 81 year old male with breakthrough seizure in the setting of possible bronchitis or pneumonia. EEG to evaluate for seizure. Level of alertness:  lethargic AEDs during EEG study: LCM Technical aspects: This EEG study was done with scalp electrodes positioned according to the 10-20 International system of electrode placement. Electrical activity was acquired at a sampling rate of '500Hz'$  and reviewed with a high frequency filter of '70Hz'$  and a low frequency filter of '1Hz'$ . EEG data were recorded continuously and digitally stored. Description: No clear posterior dominant rhythm was seen. EEG showed continuous 5 to 8 Hz theta-alpha activity in left hemisphere. There is low amplitude 2-'3hz'$  delta slowing in right hemisphere.   Hyperventilation and photic stimulation were not performed.   ABNORMALITY - Continuous slow, generalized and lateralized right hemisphere IMPRESSION: This study is suggestive of cortical dysfunction arising from right hemisphere likely secondary to underlying structural abnormality. Additionally there is moderate diffuse encephalopathy, nonspecific etiology. No seizures or epileptiform discharges were seen throughout the recording. Lora Havens   CT Head Wo Contrast  Result Date: 08/10/2021 CLINICAL DATA:  Mental status changes. EXAM: CT HEAD WITHOUT CONTRAST TECHNIQUE: Contiguous axial images were obtained from the base of the skull through the vertex without intravenous contrast. RADIATION DOSE REDUCTION:  This exam was performed according to the departmental dose-optimization program which includes automated exposure control, adjustment of the mA and/or kV according to patient size and/or use of iterative reconstruction technique.  COMPARISON:  01/23/2021.  12/14/2020. FINDINGS: Brain: There is no evidence for acute hemorrhage, hydrocephalus, mass lesion, or abnormal extra-axial fluid collection. No definite CT evidence for acute infarction. Diffuse loss of parenchymal volume is consistent with atrophy. Patchy low attenuation in the deep hemispheric and periventricular white matter is nonspecific, but likely reflects chronic microvascular ischemic demyelination. Global ventriculomegaly likely a combination of atrophy and previous infarcts. Stable appearance right frontotemporal parietal encephalomalacia consistent with MCA territory infarct. Encephalomalacia in the left parietal lobe is stable. Vascular: No hyperdense vessel or unexpected calcification. Skull: Right craniotomy defect again noted. Sinuses/Orbits: The visualized paranasal sinuses and mastoid air cells are clear. Visualized portions of the globes and intraorbital fat are unremarkable. Other: None. IMPRESSION: 1. No acute intracranial abnormality. 2. 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 3. Atrophy with chronic small vessel white matter ischemic disease. Electronically Signed   By: Misty Stanley M.D.   On: 08/10/2021 11:44   DG Chest Port 1 View  Result Date: 08/10/2021 CLINICAL DATA:  Altered mental status. EXAM: PORTABLE CHEST 1 VIEW COMPARISON:  January 23, 2021. FINDINGS: Patchy bilateral airspace opacities are mildly improved. Visible pleural effusions or pneumothorax. Similar cardiomediastinal silhouette. Cardiac valve replacements. Median sternotomy. No evidence of acute osseous abnormality. IMPRESSION: Patchy bilateral airspace opacities are mildly improved. Electronically Signed   By: Margaretha Sheffield M.D.   On: 08/10/2021 10:16    Assessment/Plan  1. Abdominal distention Improved after not using bipap for two nights and getting supp to have BMs.  They are going to re try the bipap. Assess the abd prior to using and after. If distention is noted  this may be aerophagia due to bipap and they would need to contract neurology for further input regarding using this device. If constipation no further work and measures are needed.   2. Heme + stool These specimens were taken after large BMS with constipation all in a short span of time. I would recommend collecting three on consecutive days. His Hgb is not dropping.   3. Hypernatremia Mild, encourage water Repeat bmp pending.   4. Leukocytosis, unspecified type Mild, no fever, may be due to constipation Will order f/u CBC   5. Prosthetic valve endocarditis, subsequent encounter On amoxicillin indefinitely.   6. Seizures ([place]) No new on vimpat   7. PAF (paroxysmal atrial fibrillation) ([place]) Rate is regular today and controlled on tikosyn ON eliquis for CVA risk reduction per cardiology   8. Controlled type 2 diabetes mellitus with complication, without long-term current use of insulin (HCC) Lab Results  Component Value Date   HGBA1C 5.7 07/05/2021  Diet controlled    9. Benign prostatic hyperplasia with urinary obstruction Mild retention at times, has not needed I and O cath Continue current meds per urology   10. Chronic systolic heart failure (HCC) Ef 45-50% Periodically needs prn lasix, very sensitive and can become hypernatremic easily Continue to monitor weights.  His xray did show some vascular congestion but his lungs are clear with trace edema. Weight is down. Not sob and is not needing oxygen. Due to his propensity for hypernatremia will monitor now and avoid diuretics.    Family/ staff Communication: Herbert Pun wife.   Labs/tests ordered:  BMP 09/09/2021 CBC 09/13/21

## 2021-09-10 DIAGNOSIS — I69354 Hemiplegia and hemiparesis following cerebral infarction affecting left non-dominant side: Secondary | ICD-10-CM | POA: Diagnosis not present

## 2021-09-10 DIAGNOSIS — R293 Abnormal posture: Secondary | ICD-10-CM | POA: Diagnosis not present

## 2021-09-10 DIAGNOSIS — R278 Other lack of coordination: Secondary | ICD-10-CM | POA: Diagnosis not present

## 2021-09-10 DIAGNOSIS — H538 Other visual disturbances: Secondary | ICD-10-CM | POA: Diagnosis not present

## 2021-09-10 DIAGNOSIS — M6389 Disorders of muscle in diseases classified elsewhere, multiple sites: Secondary | ICD-10-CM | POA: Diagnosis not present

## 2021-09-10 DIAGNOSIS — I5022 Chronic systolic (congestive) heart failure: Secondary | ICD-10-CM | POA: Diagnosis not present

## 2021-09-10 DIAGNOSIS — R41840 Attention and concentration deficit: Secondary | ICD-10-CM | POA: Diagnosis not present

## 2021-09-10 DIAGNOSIS — R41841 Cognitive communication deficit: Secondary | ICD-10-CM | POA: Diagnosis not present

## 2021-09-10 DIAGNOSIS — I69391 Dysphagia following cerebral infarction: Secondary | ICD-10-CM | POA: Diagnosis not present

## 2021-09-10 DIAGNOSIS — G4089 Other seizures: Secondary | ICD-10-CM | POA: Diagnosis not present

## 2021-09-10 DIAGNOSIS — S065X3S Traumatic subdural hemorrhage with loss of consciousness of 1 hour to 5 hours 59 minutes, sequela: Secondary | ICD-10-CM | POA: Diagnosis not present

## 2021-09-10 DIAGNOSIS — I69812 Visuospatial deficit and spatial neglect following other cerebrovascular disease: Secondary | ICD-10-CM | POA: Diagnosis not present

## 2021-09-13 ENCOUNTER — Non-Acute Institutional Stay (SKILLED_NURSING_FACILITY): Payer: Medicare Other | Admitting: Internal Medicine

## 2021-09-13 ENCOUNTER — Encounter: Payer: Self-pay | Admitting: Internal Medicine

## 2021-09-13 DIAGNOSIS — R278 Other lack of coordination: Secondary | ICD-10-CM | POA: Diagnosis not present

## 2021-09-13 DIAGNOSIS — T826XXD Infection and inflammatory reaction due to cardiac valve prosthesis, subsequent encounter: Secondary | ICD-10-CM

## 2021-09-13 DIAGNOSIS — N401 Enlarged prostate with lower urinary tract symptoms: Secondary | ICD-10-CM | POA: Diagnosis not present

## 2021-09-13 DIAGNOSIS — E118 Type 2 diabetes mellitus with unspecified complications: Secondary | ICD-10-CM

## 2021-09-13 DIAGNOSIS — R140 Abdominal distension (gaseous): Secondary | ICD-10-CM

## 2021-09-13 DIAGNOSIS — I480 Paroxysmal atrial fibrillation: Secondary | ICD-10-CM | POA: Diagnosis not present

## 2021-09-13 DIAGNOSIS — R195 Other fecal abnormalities: Secondary | ICD-10-CM | POA: Diagnosis not present

## 2021-09-13 DIAGNOSIS — N138 Other obstructive and reflux uropathy: Secondary | ICD-10-CM

## 2021-09-13 DIAGNOSIS — D72829 Elevated white blood cell count, unspecified: Secondary | ICD-10-CM

## 2021-09-13 DIAGNOSIS — R293 Abnormal posture: Secondary | ICD-10-CM | POA: Diagnosis not present

## 2021-09-13 DIAGNOSIS — R569 Unspecified convulsions: Secondary | ICD-10-CM | POA: Diagnosis not present

## 2021-09-13 DIAGNOSIS — H538 Other visual disturbances: Secondary | ICD-10-CM | POA: Diagnosis not present

## 2021-09-13 DIAGNOSIS — S065X3S Traumatic subdural hemorrhage with loss of consciousness of 1 hour to 5 hours 59 minutes, sequela: Secondary | ICD-10-CM | POA: Diagnosis not present

## 2021-09-13 DIAGNOSIS — I5022 Chronic systolic (congestive) heart failure: Secondary | ICD-10-CM

## 2021-09-13 DIAGNOSIS — E870 Hyperosmolality and hypernatremia: Secondary | ICD-10-CM | POA: Diagnosis not present

## 2021-09-13 DIAGNOSIS — I38 Endocarditis, valve unspecified: Secondary | ICD-10-CM | POA: Diagnosis not present

## 2021-09-13 DIAGNOSIS — I69354 Hemiplegia and hemiparesis following cerebral infarction affecting left non-dominant side: Secondary | ICD-10-CM | POA: Diagnosis not present

## 2021-09-13 DIAGNOSIS — R41841 Cognitive communication deficit: Secondary | ICD-10-CM | POA: Diagnosis not present

## 2021-09-13 DIAGNOSIS — G4089 Other seizures: Secondary | ICD-10-CM | POA: Diagnosis not present

## 2021-09-13 DIAGNOSIS — M6389 Disorders of muscle in diseases classified elsewhere, multiple sites: Secondary | ICD-10-CM | POA: Diagnosis not present

## 2021-09-13 DIAGNOSIS — I69812 Visuospatial deficit and spatial neglect following other cerebrovascular disease: Secondary | ICD-10-CM | POA: Diagnosis not present

## 2021-09-13 DIAGNOSIS — R41840 Attention and concentration deficit: Secondary | ICD-10-CM | POA: Diagnosis not present

## 2021-09-13 DIAGNOSIS — I69391 Dysphagia following cerebral infarction: Secondary | ICD-10-CM | POA: Diagnosis not present

## 2021-09-13 DIAGNOSIS — D720 Genetic anomalies of leukocytes: Secondary | ICD-10-CM | POA: Diagnosis not present

## 2021-09-13 NOTE — Progress Notes (Unsigned)
Location:    [facility] Room Number: [place] of Service:  SNF [phone]) Provider:  Veleta Miners MD  [name], MD  Patient Care Team: [name], MD as PCP - General (Internal Medicine) Larey Dresser, MD as PCP - Cardiology (Cardiology) [name], MD as Consulting Physician (Ophthalmology) [name], MD (Internal Medicine)  Extended Emergency Contact Information Primary Emergency Contact: [name] Address: [address]          [place], [place] [address] [place] Phone: [phone] Mobile Phone: [phone] Relation: Spouse Secondary Emergency Contact: [name], [place] [address] [name] Phone: [phone] Relation: Son  Code Status:  DNR Goals of care: Advanced Directive information    09/13/2021   10:08 AM  Advanced Directives  Does Patient Have a Medical Advance Directive? Yes  Type of Paramedic of [place];Living will;Out of facility DNR (pink MOST or yellow form)  Does patient want to make changes to medical advance directive? No - Patient declined  Copy of [place] in Chart? Yes - validated most recent copy scanned in chart (See row information)     Chief Complaint  Patient presents with   Acute Visit    Heme pos stool     HPI:  Pt is a 81 y.o. 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  KUB showed constipation.  Was given suppository  the patient had several BMs.  His appetite improved.  During that time the nurses also noticed some red blood in his stool no melena.  Hemoglobin was done and it was stable at 10.2 Patient had repeat Hemoccult cards done which were strongly positive.  Patient unable to give much history Since he is doing better.  Eating better.  Has not noticed any more abdominal extension distention. Continues his BiPAP but still gets 6 to 7 hours of sleep. She was also recently diuresed with low-dose of Lasix. 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    Other and unspecified hyperlipidemia    Pre-diabetes    Previous back surgery 1978, jan 2007   Psychosexual dysfunction with inhibited sexual excitement    SOB (shortness of breath)    "with heavy exercise"   Stroke ([place]) 08/2013   "I WAS IN AFIB AND THREW A CLOT, THE EFFECTS WERE TRANSITORY  AND DIDNT LAST BUT FOR 30 MINUTES"    Thoracic aortic aneurysm [facility])    Past Surgical History:  Procedure Laterality Date   AORTIC AND MITRAL VALVE REPLACEMENT     09/2004   CARDIOVERSION     3 times from 2004-2006   CARDIOVERSION N/A 09/26/2013   Procedure: CARDIOVERSION;  Surgeon: Larey Dresser, MD;  Location: [place];  Service: Cardiovascular;  Laterality: N/A;   CARDIOVERSION N/A 06/19/2014   Procedure: CARDIOVERSION;  Surgeon: Jerline Pain, MD;  Location: [place];  Service: Cardiovascular;  Laterality: N/A;   CARDIOVERSION N/A 04/24/2017   Procedure:  CARDIOVERSION;  Surgeon: Larey Dresser, MD;  Location: [place];  Service: Cardiovascular;  Laterality: N/A;   CARDIOVERSION N/A 06/08/2017   Procedure: CARDIOVERSION;  Surgeon: Pixie Casino, MD;  Location: [facility] ENDOSCOPY;  Service: Cardiovascular;  Laterality: N/A;   CARDIOVERSION N/A 08/24/2017   Procedure: CARDIOVERSION;  Surgeon: Skeet Latch, MD;  Location: [facility] ENDOSCOPY;  Service: Cardiovascular;  Laterality: N/A;   CARDIOVERSION N/A 02/14/2018   Procedure: CARDIOVERSION;  Surgeon: Larey Dresser, MD;  Location: [facility] ENDOSCOPY;  Service: Cardiovascular;  Laterality: N/A;   COLONOSCOPY     CRANIOTOMY N/A 07/28/2020   Procedure: CRANIOTOMY FOR EVACUATION OF SUBDURAL HEMATOMA;  Surgeon: Eustace Jean, MD;  Location: [place];  Service: Neurosurgery;  Laterality: N/A;   IR ANGIO VERTEBRAL SEL SUBCLAVIAN INNOMINATE UNI R MOD SED  08/07/2020   IR CT HEAD LTD  08/07/2020   IR PERCUTANEOUS ART THROMBECTOMY/INFUSION INTRACRANIAL INC DIAG ANGIO  08/07/2020   laminectomies     10/1975 and in 03/2005   RADIOLOGY WITH ANESTHESIA N/A 08/07/2020   Procedure: IR WITH ANESTHESIA;  Surgeon: Luanne Bras, MD;  Location: [place];  Service: Radiology;  Laterality: N/A;   TEE WITHOUT CARDIOVERSION N/A 12/18/2020   Procedure: TRANSESOPHAGEAL ECHOCARDIOGRAM (TEE);  Surgeon: Larey Dresser, MD;  Location: [facility] ENDOSCOPY;  Service: Cardiovascular;  Laterality: N/A;   [place] Right 04/03/2018   Procedure: TOTAL HIP ARTHROPLASTY ANTERIOR APPROACH;  Surgeon: Paralee Cancel, MD;  Location: [facility];  Service: Orthopedics;  Laterality: Right;  70 mins    Allergies  Allergen Reactions   Naproxen Hives   No [facility] Food Allergies Other (See Comments)    Scallops - distress, nausea and vomitting    Allergies as of 09/13/2021       Reactions   Naproxen Hives   No [facility] Food Allergies Other (See Comments)   Scallops - distress, nausea and vomitting         Medication List        Accurate as of September 13, 2021 10:08 AM. If you have any questions, ask your nurse or doctor.          acetaminophen 325 MG tablet Commonly known as: TYLENOL Take 650 mg by mouth as needed for mild pain.   amantadine 100 MG capsule Commonly known as: SYMMETREL Take 100 mg by mouth in the morning and at bedtime. Give one tab with breakfast and lunch   amLODipine 5 MG tablet Commonly known as: NORVASC Take 5 mg by mouth daily.   amoxicillin 500 MG capsule Commonly known as: AMOXIL Take 500 mg by mouth 2 (two) times daily.  *NO STOP DATE* - per pharmacy can open capsule -Do not crush   apixaban 5 MG Tabs tablet Commonly known as: ELIQUIS Take 1 tablet (5 mg total) by mouth 2 (two) times daily.  atorvastatin 80 MG tablet Commonly known as: LIPITOR Take 1 tablet (80 mg total) by mouth daily.   BIOTENE MOISTURIZING MOUTH MT Use as directed 2 sprays in the mouth or throat in the morning, at noon, and at bedtime.   bisacodyl 10 MG suppository Commonly known as: DULCOLAX Place 1 suppository (10 mg total) rectally daily as needed for moderate constipation or mild constipation.   clobetasol 0.05 % external solution Commonly known as: TEMOVATE Apply 1 application  topically as needed (itching).   dofetilide 125 MCG capsule Commonly known as: TIKOSYN Take 3 capsules (375 mcg total) by mouth 2 (two) times daily.   famotidine 20 MG tablet Commonly known as: PEPCID Take 20 mg by mouth at bedtime as needed for indigestion.   finasteride 5 MG tablet Commonly known as: PROSCAR Take 1 tablet (5 mg total) by mouth daily.   hydrocortisone 2.5 % cream Apply 1 application. topically daily as needed (itching).   lacosamide 50 MG Tabs tablet Commonly known as: VIMPAT Take 1 tablet (50 mg total) by mouth 2 (two) times daily.   levalbuterol 0.63 MG/3ML nebulizer solution Commonly known as: XOPENEX Take 0.63 mg by nebulization every 6 (six) hours as needed  for wheezing or shortness of breath.   magnesium gluconate 500 MG tablet Commonly known as: MAGONATE Take 1 tablet (500 mg total) by mouth daily.   methenamine 1 g tablet Commonly known as: HIPREX Take 1 g by mouth every 12 (twelve) hours.   multivitamin with minerals Tabs tablet Take 1 tablet by mouth daily.   polyethylene glycol 17 g packet Commonly known as: MIRALAX / GLYCOLAX Take 17 g by mouth every other day.   potassium chloride 10 MEQ tablet Commonly known as: KLOR-CON M Take 1 tablet (10 mEq total) by mouth daily.   senna-docusate 8.6-50 MG tablet Commonly known as: Senokot-S Take 1 tablet by mouth at bedtime as needed.   Systane 0.4-0.3 % Soln Generic drug: Polyethyl Glycol-Propyl Glycol Place 2 drops into both eyes daily.   Valtoco 15 MG Dose 2 x 7.5 MG/0.1ML Lqpk Generic drug: diazePAM (15 MG Dose) Place 7.5 mg into each nostril (total of 15 mg) for grand mal seizure lasting over 5 minutes. Can repeat once in 4 hours. Do Not use more than twice in 24 hours        Review of Systems  Unable to perform ROS: Other    Immunization History  Administered Date(s) Administered   Fluad Quad(high Dose 65+) 12/06/2018, 12/09/2020   Influenza Whole 12/25/2008, 01/05/2010, 12/30/2011   Influenza, High Dose Seasonal PF 12/14/2016   Influenza,inj,Quad PF,6+ Mos 12/19/2013   Influenza,inj,quad, With Preservative 11/22/2017   Influenza-Unspecified 12/14/2015, 12/24/2019   Moderna Covid-19 Vaccine Bivalent Booster 71yr & up 01/12/2021   Moderna SARS-COV2 Booster Vaccination 01/02/2020, 06/26/2020   Moderna Sars-Covid-2 Vaccination 04/08/2019, 05/05/2019   Pneumococcal Conjugate-13 12/19/2013   Pneumococcal Polysaccharide-23 09/24/2007, 05/22/2017   Td 12/25/2008   Tdap 03/22/2018   Zoster Recombinat (Shingrix) 07/16/2019, 10/28/2019   Zoster, Live 09/24/2007   Pertinent  Health Maintenance Due  Topic Date Due   FOOT EXAM  Never done   OPHTHALMOLOGY EXAM  Never  done   URINE MICROALBUMIN  Never done   INFLUENZA VACCINE  10/05/2021   HEMOGLOBIN A1C  01/05/2022   COLONOSCOPY (Pts 45-442yrInsurance coverage will need to be confirmed)  Discontinued      08/10/2021    9:36 PM 08/10/2021   11:10 PM 08/11/2021    8:00 AM 08/11/2021  7:30 PM 08/12/2021    8:00 AM  Fall Risk  Patient Fall Risk Level High fall risk High fall risk High fall risk High fall risk High fall risk   Functional Status Survey:    Vitals:   09/13/21 0951  BP: 124/85  Pulse: 89  Resp: 16  Temp: (!) 97.5 F (36.4 C)  SpO2: 97%  Weight: 144 lb (65.3 kg)  Height: '5\' 7"'$  (1.702 m)   Body mass index is 22.55 kg/m. Physical Exam Vitals reviewed.  Constitutional:      Appearance: Normal appearance.  HENT:     Head: Normocephalic.     Nose: Nose normal.     Mouth/Throat:     Mouth: Mucous membranes are moist.     Pharynx: Oropharynx is clear.  Eyes:     Pupils: Pupils are equal, round, and reactive to light.  Cardiovascular:     Rate and Rhythm: Normal rate and regular rhythm.     Pulses: Normal pulses.     Heart sounds: No murmur heard. Pulmonary:     Effort: Pulmonary effort is normal. No respiratory distress.     Breath sounds: Normal breath sounds. No rales.  Abdominal:     General: Abdomen is flat. Bowel sounds are normal. There is no distension.     Palpations: Abdomen is soft.     Tenderness: There is no abdominal tenderness. There is no guarding.  Musculoskeletal:     Cervical back: Neck supple.     Comments: Mild Swelling Bilateral Right more then Left  Skin:    General: Skin is warm.  Neurological:     Mental Status: He is alert.  Psychiatric:        Mood and Affect: Mood normal.        Thought Content: Thought content normal.     Labs reviewed: Recent Labs    12/18/20 1630 12/19/20 0610 12/19/20 1632 12/20/20 0153 02/08/21 0000 02/09/21 0000 08/10/21 2694 08/10/21 0957 08/11/21 0408 08/12/21 0107 08/16/21 0000 08/30/21 0000  09/06/21 0000 09/09/21 0000  NA  --  139  --    < > 150*   < > 141   < > 140 142   < > 141 147 139  K  --  3.5  --    < > 3.7   < > 3.8   < > 3.6 3.9   < > 4.2 4.0 3.6  CL  --  108  --    < > 107   < > 111  --  109 113*   < > 107 111* 105  CO2  --  23  --    < > 22   < > 16*  --  24 23   < > 23* 25* 23*  GLUCOSE  --  186*  --    < >  --    < > 148*  --  120* 143*  --   --   --   --   BUN  --  16  --    < > 23*   < > 22  --  17 17   < > 17 25* 29*  CREATININE  --  0.76  --    < > 0.9   < > 1.18  --  0.81 0.83   < > 0.7 0.7 0.6  CALCIUM  --  7.7*  --    < > 9.9   < > 8.8*  --  8.6*  8.7*   < > 9.2 9.5 9.0  MG 1.9 2.0 1.9   < > 2.1  --   --   --  1.9 2.0  --   --   --   --   PHOS 3.1 2.7 2.4*  --   --   --   --   --   --   --   --   --   --   --    < > = values in this interval not displayed.   Recent Labs    07/12/21 1056 08/10/21 0937 08/11/21 0408 09/07/21 0000 09/08/21 0000  AST '22 29 27 24 24  '$ ALT '22 26 23 18 18  '$ ALKPHOS  --  93 80 105 105  BILITOT 0.7 0.5 0.8  --   --   PROT 7.2 6.4* 5.8*  --   --   ALBUMIN  --  3.3* 3.1* 3.5 3.5   Recent Labs    07/12/21 1056 08/10/21 0937 08/10/21 0957 08/11/21 0408 08/12/21 0107 09/07/21 0000  WBC 9.0 16.1*  --  9.1 10.2 12.6  NEUTROABS 6,804 14.3*  --   --  7.8*  --   HGB 10.9* 10.0*   < > 9.4* 9.4* 10.2*  HCT 33.9* 31.6*   < > 28.7* 29.8* 31*  MCV 90.4 96.3  --  92.0 94.3  --   PLT 426* 238  --  218 227 289   < > = values in this interval not displayed.   Lab Results  Component Value Date   TSH 1.773 01/24/2021   Lab Results  Component Value Date   HGBA1C 5.7 07/05/2021   Lab Results  Component Value Date   CHOL 110 07/05/2021   HDL 43 07/05/2021   LDLCALC 57 07/05/2021   TRIG 51 07/05/2021   CHOLHDL 2.8 08/08/2020    Significant Diagnostic Results in last 30 days:  No results found.  Assessment/Plan 1. Heme + stool ? Etiology Do not think it is Upper Gi Does have h/o Hemorrhoids and Diverticulosis Last  Colonoscopy in 2014 Not candidate for any Procedures or to hold Eliquis. D/W Wife Hold GI referal right now  Will try Anusol HC for 7 days On Pepcid Will add omeprazole for 2 weeks Repeat CBC in 2 weeks Repeat Occult in 6 weeks  2. Abdominal distention Resolveds since she got Supposi On Miralax and senna  3. Hypernatremia Gets BMP Q weekly per Wifes request  4. Leukocytosis, unspecified type Repeat CBC  5. Prosthetic valve endocarditis, subsequent encounter On Amoxicillin  6. Seizures ([place])   7. PAF (paroxysmal atrial fibrillation) (H[facility]) ***  8. Controlled type 2 diabetes mellitus with complication, without long-term current use of insulin ([facility]) ***  9. Benign prostatic hyperplasia with urinary obstruction ***  10. Chronic systolic heart failure ([place]) Recently diuresed with low dos eof Laisix     Family/ staff Communication:   Labs/tests ordered:

## 2021-09-14 DIAGNOSIS — R278 Other lack of coordination: Secondary | ICD-10-CM | POA: Diagnosis not present

## 2021-09-14 DIAGNOSIS — I5022 Chronic systolic (congestive) heart failure: Secondary | ICD-10-CM | POA: Diagnosis not present

## 2021-09-14 DIAGNOSIS — I69812 Visuospatial deficit and spatial neglect following other cerebrovascular disease: Secondary | ICD-10-CM | POA: Diagnosis not present

## 2021-09-14 DIAGNOSIS — S065X3S Traumatic subdural hemorrhage with loss of consciousness of 1 hour to 5 hours 59 minutes, sequela: Secondary | ICD-10-CM | POA: Diagnosis not present

## 2021-09-14 DIAGNOSIS — R41841 Cognitive communication deficit: Secondary | ICD-10-CM | POA: Diagnosis not present

## 2021-09-14 DIAGNOSIS — H538 Other visual disturbances: Secondary | ICD-10-CM | POA: Diagnosis not present

## 2021-09-14 DIAGNOSIS — G4089 Other seizures: Secondary | ICD-10-CM | POA: Diagnosis not present

## 2021-09-14 DIAGNOSIS — R293 Abnormal posture: Secondary | ICD-10-CM | POA: Diagnosis not present

## 2021-09-14 DIAGNOSIS — I69391 Dysphagia following cerebral infarction: Secondary | ICD-10-CM | POA: Diagnosis not present

## 2021-09-14 DIAGNOSIS — I69354 Hemiplegia and hemiparesis following cerebral infarction affecting left non-dominant side: Secondary | ICD-10-CM | POA: Diagnosis not present

## 2021-09-14 DIAGNOSIS — M6389 Disorders of muscle in diseases classified elsewhere, multiple sites: Secondary | ICD-10-CM | POA: Diagnosis not present

## 2021-09-14 DIAGNOSIS — R41840 Attention and concentration deficit: Secondary | ICD-10-CM | POA: Diagnosis not present

## 2021-09-15 DIAGNOSIS — I5022 Chronic systolic (congestive) heart failure: Secondary | ICD-10-CM | POA: Diagnosis not present

## 2021-09-15 DIAGNOSIS — G4089 Other seizures: Secondary | ICD-10-CM | POA: Diagnosis not present

## 2021-09-15 DIAGNOSIS — R41840 Attention and concentration deficit: Secondary | ICD-10-CM | POA: Diagnosis not present

## 2021-09-15 DIAGNOSIS — H538 Other visual disturbances: Secondary | ICD-10-CM | POA: Diagnosis not present

## 2021-09-15 DIAGNOSIS — R293 Abnormal posture: Secondary | ICD-10-CM | POA: Diagnosis not present

## 2021-09-15 DIAGNOSIS — I69354 Hemiplegia and hemiparesis following cerebral infarction affecting left non-dominant side: Secondary | ICD-10-CM | POA: Diagnosis not present

## 2021-09-15 DIAGNOSIS — M6389 Disorders of muscle in diseases classified elsewhere, multiple sites: Secondary | ICD-10-CM | POA: Diagnosis not present

## 2021-09-15 DIAGNOSIS — R278 Other lack of coordination: Secondary | ICD-10-CM | POA: Diagnosis not present

## 2021-09-15 DIAGNOSIS — I69391 Dysphagia following cerebral infarction: Secondary | ICD-10-CM | POA: Diagnosis not present

## 2021-09-15 DIAGNOSIS — S065X3S Traumatic subdural hemorrhage with loss of consciousness of 1 hour to 5 hours 59 minutes, sequela: Secondary | ICD-10-CM | POA: Diagnosis not present

## 2021-09-15 DIAGNOSIS — I69812 Visuospatial deficit and spatial neglect following other cerebrovascular disease: Secondary | ICD-10-CM | POA: Diagnosis not present

## 2021-09-15 DIAGNOSIS — R41841 Cognitive communication deficit: Secondary | ICD-10-CM | POA: Diagnosis not present

## 2021-09-16 DIAGNOSIS — R41840 Attention and concentration deficit: Secondary | ICD-10-CM | POA: Diagnosis not present

## 2021-09-16 DIAGNOSIS — G4089 Other seizures: Secondary | ICD-10-CM | POA: Diagnosis not present

## 2021-09-16 DIAGNOSIS — R293 Abnormal posture: Secondary | ICD-10-CM | POA: Diagnosis not present

## 2021-09-16 DIAGNOSIS — I5022 Chronic systolic (congestive) heart failure: Secondary | ICD-10-CM | POA: Diagnosis not present

## 2021-09-16 DIAGNOSIS — M6389 Disorders of muscle in diseases classified elsewhere, multiple sites: Secondary | ICD-10-CM | POA: Diagnosis not present

## 2021-09-16 DIAGNOSIS — R41841 Cognitive communication deficit: Secondary | ICD-10-CM | POA: Diagnosis not present

## 2021-09-16 DIAGNOSIS — S065X3S Traumatic subdural hemorrhage with loss of consciousness of 1 hour to 5 hours 59 minutes, sequela: Secondary | ICD-10-CM | POA: Diagnosis not present

## 2021-09-16 DIAGNOSIS — I69391 Dysphagia following cerebral infarction: Secondary | ICD-10-CM | POA: Diagnosis not present

## 2021-09-16 DIAGNOSIS — H538 Other visual disturbances: Secondary | ICD-10-CM | POA: Diagnosis not present

## 2021-09-16 DIAGNOSIS — I69354 Hemiplegia and hemiparesis following cerebral infarction affecting left non-dominant side: Secondary | ICD-10-CM | POA: Diagnosis not present

## 2021-09-16 DIAGNOSIS — I69812 Visuospatial deficit and spatial neglect following other cerebrovascular disease: Secondary | ICD-10-CM | POA: Diagnosis not present

## 2021-09-16 DIAGNOSIS — R278 Other lack of coordination: Secondary | ICD-10-CM | POA: Diagnosis not present

## 2021-09-17 DIAGNOSIS — M6389 Disorders of muscle in diseases classified elsewhere, multiple sites: Secondary | ICD-10-CM | POA: Diagnosis not present

## 2021-09-17 DIAGNOSIS — R41841 Cognitive communication deficit: Secondary | ICD-10-CM | POA: Diagnosis not present

## 2021-09-17 DIAGNOSIS — R293 Abnormal posture: Secondary | ICD-10-CM | POA: Diagnosis not present

## 2021-09-17 DIAGNOSIS — I69354 Hemiplegia and hemiparesis following cerebral infarction affecting left non-dominant side: Secondary | ICD-10-CM | POA: Diagnosis not present

## 2021-09-17 DIAGNOSIS — H538 Other visual disturbances: Secondary | ICD-10-CM | POA: Diagnosis not present

## 2021-09-17 DIAGNOSIS — G4089 Other seizures: Secondary | ICD-10-CM | POA: Diagnosis not present

## 2021-09-17 DIAGNOSIS — R278 Other lack of coordination: Secondary | ICD-10-CM | POA: Diagnosis not present

## 2021-09-17 DIAGNOSIS — S065X3S Traumatic subdural hemorrhage with loss of consciousness of 1 hour to 5 hours 59 minutes, sequela: Secondary | ICD-10-CM | POA: Diagnosis not present

## 2021-09-17 DIAGNOSIS — I5022 Chronic systolic (congestive) heart failure: Secondary | ICD-10-CM | POA: Diagnosis not present

## 2021-09-17 DIAGNOSIS — I69391 Dysphagia following cerebral infarction: Secondary | ICD-10-CM | POA: Diagnosis not present

## 2021-09-17 DIAGNOSIS — R41840 Attention and concentration deficit: Secondary | ICD-10-CM | POA: Diagnosis not present

## 2021-09-17 DIAGNOSIS — I69812 Visuospatial deficit and spatial neglect following other cerebrovascular disease: Secondary | ICD-10-CM | POA: Diagnosis not present

## 2021-09-20 ENCOUNTER — Telehealth: Payer: Self-pay | Admitting: Neurology

## 2021-09-20 ENCOUNTER — Ambulatory Visit: Payer: Medicare Other | Admitting: Neurology

## 2021-09-20 DIAGNOSIS — M6389 Disorders of muscle in diseases classified elsewhere, multiple sites: Secondary | ICD-10-CM | POA: Diagnosis not present

## 2021-09-20 DIAGNOSIS — R140 Abdominal distension (gaseous): Secondary | ICD-10-CM | POA: Diagnosis not present

## 2021-09-20 DIAGNOSIS — S065X3S Traumatic subdural hemorrhage with loss of consciousness of 1 hour to 5 hours 59 minutes, sequela: Secondary | ICD-10-CM | POA: Diagnosis not present

## 2021-09-20 DIAGNOSIS — R41841 Cognitive communication deficit: Secondary | ICD-10-CM | POA: Diagnosis not present

## 2021-09-20 DIAGNOSIS — I69354 Hemiplegia and hemiparesis following cerebral infarction affecting left non-dominant side: Secondary | ICD-10-CM | POA: Diagnosis not present

## 2021-09-20 DIAGNOSIS — H538 Other visual disturbances: Secondary | ICD-10-CM | POA: Diagnosis not present

## 2021-09-20 DIAGNOSIS — R293 Abnormal posture: Secondary | ICD-10-CM | POA: Diagnosis not present

## 2021-09-20 DIAGNOSIS — R278 Other lack of coordination: Secondary | ICD-10-CM | POA: Diagnosis not present

## 2021-09-20 DIAGNOSIS — I69812 Visuospatial deficit and spatial neglect following other cerebrovascular disease: Secondary | ICD-10-CM | POA: Diagnosis not present

## 2021-09-20 DIAGNOSIS — I69391 Dysphagia following cerebral infarction: Secondary | ICD-10-CM | POA: Diagnosis not present

## 2021-09-20 DIAGNOSIS — R41840 Attention and concentration deficit: Secondary | ICD-10-CM | POA: Diagnosis not present

## 2021-09-20 DIAGNOSIS — I5022 Chronic systolic (congestive) heart failure: Secondary | ICD-10-CM | POA: Diagnosis not present

## 2021-09-20 DIAGNOSIS — G4089 Other seizures: Secondary | ICD-10-CM | POA: Diagnosis not present

## 2021-09-20 NOTE — Telephone Encounter (Addendum)
Reviewed pt chart. Set up with current auto-CPAP 07/13/21. DME: [facility]. Already had initial CPAP f/u with JM,NP 08/26/21. Per notes: "Despite adequate compliance, trial of different masks and no evidence of leaks, AHI remains elevated with majority of them central.  Further discussed with sleep clinic who recommended switching to auto BiPAP - sleep lab will place order.  In these situations, typically recommend pursuing titration study but due to other medical reasons, unfortunately this is not an option. Will plan on f/u in 60-90 days follow up visit with Dr. Brett Fairy after initiation of AutoBiPAP"  Reviewed resmed airview. Confirmed he was set up with auto-bipap 09/01/21. Central apneas have improved but he is having air leaks still. He needs to go for mask refit.  He needs an initial auto-bipap f/u between 11/01/2021 and 11/30/2021. I called and spoke w/ wife. I cx appt made for 10/05/21 and r/s for 11/23/21 at 10:30am with Dr. Brett Fairy. Aware he needs appt 61-90 days after set up date of new machine. They will call [facility] in the meantime to do a mask refit/look at machine.

## 2021-09-20 NOTE — Telephone Encounter (Signed)
Pt came in for appt which was canceled today 09/20/2021. Wanted to let office know pt cpap is not working properly. Scheduled pt for 87/03/2021. Would like a call.

## 2021-09-21 DIAGNOSIS — R41841 Cognitive communication deficit: Secondary | ICD-10-CM | POA: Diagnosis not present

## 2021-09-21 DIAGNOSIS — G4089 Other seizures: Secondary | ICD-10-CM | POA: Diagnosis not present

## 2021-09-21 DIAGNOSIS — M6389 Disorders of muscle in diseases classified elsewhere, multiple sites: Secondary | ICD-10-CM | POA: Diagnosis not present

## 2021-09-21 DIAGNOSIS — I69812 Visuospatial deficit and spatial neglect following other cerebrovascular disease: Secondary | ICD-10-CM | POA: Diagnosis not present

## 2021-09-21 DIAGNOSIS — S065X3S Traumatic subdural hemorrhage with loss of consciousness of 1 hour to 5 hours 59 minutes, sequela: Secondary | ICD-10-CM | POA: Diagnosis not present

## 2021-09-21 DIAGNOSIS — I5022 Chronic systolic (congestive) heart failure: Secondary | ICD-10-CM | POA: Diagnosis not present

## 2021-09-21 DIAGNOSIS — R293 Abnormal posture: Secondary | ICD-10-CM | POA: Diagnosis not present

## 2021-09-21 DIAGNOSIS — H538 Other visual disturbances: Secondary | ICD-10-CM | POA: Diagnosis not present

## 2021-09-21 DIAGNOSIS — R278 Other lack of coordination: Secondary | ICD-10-CM | POA: Diagnosis not present

## 2021-09-21 DIAGNOSIS — I69354 Hemiplegia and hemiparesis following cerebral infarction affecting left non-dominant side: Secondary | ICD-10-CM | POA: Diagnosis not present

## 2021-09-21 DIAGNOSIS — I69391 Dysphagia following cerebral infarction: Secondary | ICD-10-CM | POA: Diagnosis not present

## 2021-09-21 DIAGNOSIS — R41840 Attention and concentration deficit: Secondary | ICD-10-CM | POA: Diagnosis not present

## 2021-09-22 ENCOUNTER — Telehealth: Payer: Self-pay | Admitting: Neurology

## 2021-09-22 DIAGNOSIS — I69812 Visuospatial deficit and spatial neglect following other cerebrovascular disease: Secondary | ICD-10-CM | POA: Diagnosis not present

## 2021-09-22 DIAGNOSIS — H538 Other visual disturbances: Secondary | ICD-10-CM | POA: Diagnosis not present

## 2021-09-22 DIAGNOSIS — G4089 Other seizures: Secondary | ICD-10-CM | POA: Diagnosis not present

## 2021-09-22 DIAGNOSIS — R293 Abnormal posture: Secondary | ICD-10-CM | POA: Diagnosis not present

## 2021-09-22 DIAGNOSIS — I5022 Chronic systolic (congestive) heart failure: Secondary | ICD-10-CM | POA: Diagnosis not present

## 2021-09-22 DIAGNOSIS — I69391 Dysphagia following cerebral infarction: Secondary | ICD-10-CM | POA: Diagnosis not present

## 2021-09-22 DIAGNOSIS — S065X3S Traumatic subdural hemorrhage with loss of consciousness of 1 hour to 5 hours 59 minutes, sequela: Secondary | ICD-10-CM | POA: Diagnosis not present

## 2021-09-22 DIAGNOSIS — R278 Other lack of coordination: Secondary | ICD-10-CM | POA: Diagnosis not present

## 2021-09-22 DIAGNOSIS — I69354 Hemiplegia and hemiparesis following cerebral infarction affecting left non-dominant side: Secondary | ICD-10-CM | POA: Diagnosis not present

## 2021-09-22 DIAGNOSIS — R41840 Attention and concentration deficit: Secondary | ICD-10-CM | POA: Diagnosis not present

## 2021-09-22 DIAGNOSIS — M6389 Disorders of muscle in diseases classified elsewhere, multiple sites: Secondary | ICD-10-CM | POA: Diagnosis not present

## 2021-09-22 DIAGNOSIS — R41841 Cognitive communication deficit: Secondary | ICD-10-CM | POA: Diagnosis not present

## 2021-09-22 NOTE — Telephone Encounter (Signed)
Sent fax back to [facility] that included link on how to properly clean CPAP machine. Also provided [facility] phone# for them to defer further questions to if needed for this.

## 2021-09-22 NOTE — Telephone Encounter (Signed)
[name] [name] a nurse is calling from [facility] and wants some information on how to clean the CPAP machine and head gear, and the water chamber. She said you could call or fax her at [phone].

## 2021-09-23 DIAGNOSIS — R278 Other lack of coordination: Secondary | ICD-10-CM | POA: Diagnosis not present

## 2021-09-23 DIAGNOSIS — G4089 Other seizures: Secondary | ICD-10-CM | POA: Diagnosis not present

## 2021-09-23 DIAGNOSIS — H538 Other visual disturbances: Secondary | ICD-10-CM | POA: Diagnosis not present

## 2021-09-23 DIAGNOSIS — M6389 Disorders of muscle in diseases classified elsewhere, multiple sites: Secondary | ICD-10-CM | POA: Diagnosis not present

## 2021-09-23 DIAGNOSIS — R41841 Cognitive communication deficit: Secondary | ICD-10-CM | POA: Diagnosis not present

## 2021-09-23 DIAGNOSIS — S065X3S Traumatic subdural hemorrhage with loss of consciousness of 1 hour to 5 hours 59 minutes, sequela: Secondary | ICD-10-CM | POA: Diagnosis not present

## 2021-09-23 DIAGNOSIS — I5022 Chronic systolic (congestive) heart failure: Secondary | ICD-10-CM | POA: Diagnosis not present

## 2021-09-23 DIAGNOSIS — I69812 Visuospatial deficit and spatial neglect following other cerebrovascular disease: Secondary | ICD-10-CM | POA: Diagnosis not present

## 2021-09-23 DIAGNOSIS — I69391 Dysphagia following cerebral infarction: Secondary | ICD-10-CM | POA: Diagnosis not present

## 2021-09-23 DIAGNOSIS — R293 Abnormal posture: Secondary | ICD-10-CM | POA: Diagnosis not present

## 2021-09-23 DIAGNOSIS — R41840 Attention and concentration deficit: Secondary | ICD-10-CM | POA: Diagnosis not present

## 2021-09-23 DIAGNOSIS — I69354 Hemiplegia and hemiparesis following cerebral infarction affecting left non-dominant side: Secondary | ICD-10-CM | POA: Diagnosis not present

## 2021-09-24 DIAGNOSIS — M6389 Disorders of muscle in diseases classified elsewhere, multiple sites: Secondary | ICD-10-CM | POA: Diagnosis not present

## 2021-09-24 DIAGNOSIS — I5022 Chronic systolic (congestive) heart failure: Secondary | ICD-10-CM | POA: Diagnosis not present

## 2021-09-24 DIAGNOSIS — H538 Other visual disturbances: Secondary | ICD-10-CM | POA: Diagnosis not present

## 2021-09-24 DIAGNOSIS — S065X3S Traumatic subdural hemorrhage with loss of consciousness of 1 hour to 5 hours 59 minutes, sequela: Secondary | ICD-10-CM | POA: Diagnosis not present

## 2021-09-24 DIAGNOSIS — I69812 Visuospatial deficit and spatial neglect following other cerebrovascular disease: Secondary | ICD-10-CM | POA: Diagnosis not present

## 2021-09-24 DIAGNOSIS — R41841 Cognitive communication deficit: Secondary | ICD-10-CM | POA: Diagnosis not present

## 2021-09-24 DIAGNOSIS — R41840 Attention and concentration deficit: Secondary | ICD-10-CM | POA: Diagnosis not present

## 2021-09-24 DIAGNOSIS — R278 Other lack of coordination: Secondary | ICD-10-CM | POA: Diagnosis not present

## 2021-09-24 DIAGNOSIS — R293 Abnormal posture: Secondary | ICD-10-CM | POA: Diagnosis not present

## 2021-09-24 DIAGNOSIS — I69391 Dysphagia following cerebral infarction: Secondary | ICD-10-CM | POA: Diagnosis not present

## 2021-09-24 DIAGNOSIS — I69354 Hemiplegia and hemiparesis following cerebral infarction affecting left non-dominant side: Secondary | ICD-10-CM | POA: Diagnosis not present

## 2021-09-24 DIAGNOSIS — G4089 Other seizures: Secondary | ICD-10-CM | POA: Diagnosis not present

## 2021-09-27 ENCOUNTER — Encounter: Payer: Self-pay | Admitting: Internal Medicine

## 2021-09-27 DIAGNOSIS — E039 Hypothyroidism, unspecified: Secondary | ICD-10-CM | POA: Diagnosis not present

## 2021-09-27 DIAGNOSIS — R41840 Attention and concentration deficit: Secondary | ICD-10-CM | POA: Diagnosis not present

## 2021-09-27 DIAGNOSIS — I5022 Chronic systolic (congestive) heart failure: Secondary | ICD-10-CM | POA: Diagnosis not present

## 2021-09-27 DIAGNOSIS — G4089 Other seizures: Secondary | ICD-10-CM | POA: Diagnosis not present

## 2021-09-27 DIAGNOSIS — I69354 Hemiplegia and hemiparesis following cerebral infarction affecting left non-dominant side: Secondary | ICD-10-CM | POA: Diagnosis not present

## 2021-09-27 DIAGNOSIS — R41841 Cognitive communication deficit: Secondary | ICD-10-CM | POA: Diagnosis not present

## 2021-09-27 DIAGNOSIS — R140 Abdominal distension (gaseous): Secondary | ICD-10-CM | POA: Diagnosis not present

## 2021-09-27 DIAGNOSIS — R293 Abnormal posture: Secondary | ICD-10-CM | POA: Diagnosis not present

## 2021-09-27 DIAGNOSIS — H538 Other visual disturbances: Secondary | ICD-10-CM | POA: Diagnosis not present

## 2021-09-27 DIAGNOSIS — R278 Other lack of coordination: Secondary | ICD-10-CM | POA: Diagnosis not present

## 2021-09-27 DIAGNOSIS — I69812 Visuospatial deficit and spatial neglect following other cerebrovascular disease: Secondary | ICD-10-CM | POA: Diagnosis not present

## 2021-09-27 DIAGNOSIS — S065X3S Traumatic subdural hemorrhage with loss of consciousness of 1 hour to 5 hours 59 minutes, sequela: Secondary | ICD-10-CM | POA: Diagnosis not present

## 2021-09-27 DIAGNOSIS — M6389 Disorders of muscle in diseases classified elsewhere, multiple sites: Secondary | ICD-10-CM | POA: Diagnosis not present

## 2021-09-27 DIAGNOSIS — I69391 Dysphagia following cerebral infarction: Secondary | ICD-10-CM | POA: Diagnosis not present

## 2021-09-27 LAB — CBC AND DIFFERENTIAL
HCT: 32 — AB (ref 41–53)
Hemoglobin: 10.5 — AB (ref 13.5–17.5)
Platelets: 348 10*3/uL (ref 150–400)
WBC: 10.1

## 2021-09-27 LAB — COMPREHENSIVE METABOLIC PANEL: Calcium: 9.1 (ref 8.7–10.7)

## 2021-09-27 LAB — BASIC METABOLIC PANEL
BUN: 20 (ref 4–21)
CO2: 23 — AB (ref 13–22)
Chloride: 106 (ref 99–108)
Creatinine: 0.6 (ref 0.6–1.3)
Glucose: 96
Potassium: 4.1 mEq/L (ref 3.5–5.1)
Sodium: 143 (ref 137–147)

## 2021-09-27 LAB — CBC: RBC: 3.7 — AB (ref 3.87–5.11)

## 2021-09-28 ENCOUNTER — Telehealth: Payer: Self-pay | Admitting: Neurology

## 2021-09-28 DIAGNOSIS — I69391 Dysphagia following cerebral infarction: Secondary | ICD-10-CM | POA: Diagnosis not present

## 2021-09-28 DIAGNOSIS — S065X3S Traumatic subdural hemorrhage with loss of consciousness of 1 hour to 5 hours 59 minutes, sequela: Secondary | ICD-10-CM | POA: Diagnosis not present

## 2021-09-28 DIAGNOSIS — M6389 Disorders of muscle in diseases classified elsewhere, multiple sites: Secondary | ICD-10-CM | POA: Diagnosis not present

## 2021-09-28 DIAGNOSIS — R41840 Attention and concentration deficit: Secondary | ICD-10-CM | POA: Diagnosis not present

## 2021-09-28 DIAGNOSIS — R41841 Cognitive communication deficit: Secondary | ICD-10-CM | POA: Diagnosis not present

## 2021-09-28 DIAGNOSIS — R278 Other lack of coordination: Secondary | ICD-10-CM | POA: Diagnosis not present

## 2021-09-28 DIAGNOSIS — I69812 Visuospatial deficit and spatial neglect following other cerebrovascular disease: Secondary | ICD-10-CM | POA: Diagnosis not present

## 2021-09-28 DIAGNOSIS — R293 Abnormal posture: Secondary | ICD-10-CM | POA: Diagnosis not present

## 2021-09-28 DIAGNOSIS — I5022 Chronic systolic (congestive) heart failure: Secondary | ICD-10-CM | POA: Diagnosis not present

## 2021-09-28 DIAGNOSIS — G4089 Other seizures: Secondary | ICD-10-CM | POA: Diagnosis not present

## 2021-09-28 DIAGNOSIS — H538 Other visual disturbances: Secondary | ICD-10-CM | POA: Diagnosis not present

## 2021-09-28 DIAGNOSIS — I69354 Hemiplegia and hemiparesis following cerebral infarction affecting left non-dominant side: Secondary | ICD-10-CM | POA: Diagnosis not present

## 2021-09-28 NOTE — Telephone Encounter (Signed)
Called the wife back and I advised him comparing the BiPAP to the CPAP the patient is presenting to still have air leaks from his mask.  Advised that the data can be not as correct due to the air leaks.  The patient's wife asked for Dr. Brett Fairy to take a look at the differences between the machines in which Dr. Brett Fairy states the apnea events are still very similar to when he had the events on CPAP.  Unfortunately due to the patient's health we are unable to bring him into an in lab to qualify for any other device.  Advised that if we can try to get the leak under control that we will give [place] a better idea on how accurate the apnea events are still occurring.  The wife will contact [facility] care to see about Trying a Different Mask.

## 2021-09-28 NOTE — Telephone Encounter (Signed)
Pt wife [name] [name]) is calling and want to talk to nurse about husband events that happening at night. [name] [name] is requesting a call from the nurse

## 2021-09-29 DIAGNOSIS — I69391 Dysphagia following cerebral infarction: Secondary | ICD-10-CM | POA: Diagnosis not present

## 2021-09-29 DIAGNOSIS — G4089 Other seizures: Secondary | ICD-10-CM | POA: Diagnosis not present

## 2021-09-29 DIAGNOSIS — R41840 Attention and concentration deficit: Secondary | ICD-10-CM | POA: Diagnosis not present

## 2021-09-29 DIAGNOSIS — R293 Abnormal posture: Secondary | ICD-10-CM | POA: Diagnosis not present

## 2021-09-29 DIAGNOSIS — I69354 Hemiplegia and hemiparesis following cerebral infarction affecting left non-dominant side: Secondary | ICD-10-CM | POA: Diagnosis not present

## 2021-09-29 DIAGNOSIS — R41841 Cognitive communication deficit: Secondary | ICD-10-CM | POA: Diagnosis not present

## 2021-09-29 DIAGNOSIS — M6389 Disorders of muscle in diseases classified elsewhere, multiple sites: Secondary | ICD-10-CM | POA: Diagnosis not present

## 2021-09-29 DIAGNOSIS — S065X3S Traumatic subdural hemorrhage with loss of consciousness of 1 hour to 5 hours 59 minutes, sequela: Secondary | ICD-10-CM | POA: Diagnosis not present

## 2021-09-29 DIAGNOSIS — I69812 Visuospatial deficit and spatial neglect following other cerebrovascular disease: Secondary | ICD-10-CM | POA: Diagnosis not present

## 2021-09-29 DIAGNOSIS — R278 Other lack of coordination: Secondary | ICD-10-CM | POA: Diagnosis not present

## 2021-09-29 DIAGNOSIS — H538 Other visual disturbances: Secondary | ICD-10-CM | POA: Diagnosis not present

## 2021-09-29 DIAGNOSIS — I5022 Chronic systolic (congestive) heart failure: Secondary | ICD-10-CM | POA: Diagnosis not present

## 2021-09-30 DIAGNOSIS — S065X3S Traumatic subdural hemorrhage with loss of consciousness of 1 hour to 5 hours 59 minutes, sequela: Secondary | ICD-10-CM | POA: Diagnosis not present

## 2021-09-30 DIAGNOSIS — H538 Other visual disturbances: Secondary | ICD-10-CM | POA: Diagnosis not present

## 2021-09-30 DIAGNOSIS — M6389 Disorders of muscle in diseases classified elsewhere, multiple sites: Secondary | ICD-10-CM | POA: Diagnosis not present

## 2021-09-30 DIAGNOSIS — R41841 Cognitive communication deficit: Secondary | ICD-10-CM | POA: Diagnosis not present

## 2021-09-30 DIAGNOSIS — R278 Other lack of coordination: Secondary | ICD-10-CM | POA: Diagnosis not present

## 2021-09-30 DIAGNOSIS — R41840 Attention and concentration deficit: Secondary | ICD-10-CM | POA: Diagnosis not present

## 2021-09-30 DIAGNOSIS — G4089 Other seizures: Secondary | ICD-10-CM | POA: Diagnosis not present

## 2021-09-30 DIAGNOSIS — I69354 Hemiplegia and hemiparesis following cerebral infarction affecting left non-dominant side: Secondary | ICD-10-CM | POA: Diagnosis not present

## 2021-09-30 DIAGNOSIS — I69812 Visuospatial deficit and spatial neglect following other cerebrovascular disease: Secondary | ICD-10-CM | POA: Diagnosis not present

## 2021-09-30 DIAGNOSIS — I5022 Chronic systolic (congestive) heart failure: Secondary | ICD-10-CM | POA: Diagnosis not present

## 2021-09-30 DIAGNOSIS — R293 Abnormal posture: Secondary | ICD-10-CM | POA: Diagnosis not present

## 2021-09-30 DIAGNOSIS — I69391 Dysphagia following cerebral infarction: Secondary | ICD-10-CM | POA: Diagnosis not present

## 2021-10-01 DIAGNOSIS — G4089 Other seizures: Secondary | ICD-10-CM | POA: Diagnosis not present

## 2021-10-01 DIAGNOSIS — I69354 Hemiplegia and hemiparesis following cerebral infarction affecting left non-dominant side: Secondary | ICD-10-CM | POA: Diagnosis not present

## 2021-10-01 DIAGNOSIS — R293 Abnormal posture: Secondary | ICD-10-CM | POA: Diagnosis not present

## 2021-10-01 DIAGNOSIS — S065X3S Traumatic subdural hemorrhage with loss of consciousness of 1 hour to 5 hours 59 minutes, sequela: Secondary | ICD-10-CM | POA: Diagnosis not present

## 2021-10-01 DIAGNOSIS — I5022 Chronic systolic (congestive) heart failure: Secondary | ICD-10-CM | POA: Diagnosis not present

## 2021-10-01 DIAGNOSIS — R41841 Cognitive communication deficit: Secondary | ICD-10-CM | POA: Diagnosis not present

## 2021-10-01 DIAGNOSIS — H538 Other visual disturbances: Secondary | ICD-10-CM | POA: Diagnosis not present

## 2021-10-01 DIAGNOSIS — R4182 Altered mental status, unspecified: Secondary | ICD-10-CM | POA: Diagnosis not present

## 2021-10-01 DIAGNOSIS — M6389 Disorders of muscle in diseases classified elsewhere, multiple sites: Secondary | ICD-10-CM | POA: Diagnosis not present

## 2021-10-01 DIAGNOSIS — I69812 Visuospatial deficit and spatial neglect following other cerebrovascular disease: Secondary | ICD-10-CM | POA: Diagnosis not present

## 2021-10-01 DIAGNOSIS — R41840 Attention and concentration deficit: Secondary | ICD-10-CM | POA: Diagnosis not present

## 2021-10-01 DIAGNOSIS — R278 Other lack of coordination: Secondary | ICD-10-CM | POA: Diagnosis not present

## 2021-10-01 DIAGNOSIS — I69391 Dysphagia following cerebral infarction: Secondary | ICD-10-CM | POA: Diagnosis not present

## 2021-10-04 DIAGNOSIS — R293 Abnormal posture: Secondary | ICD-10-CM | POA: Diagnosis not present

## 2021-10-04 DIAGNOSIS — R41841 Cognitive communication deficit: Secondary | ICD-10-CM | POA: Diagnosis not present

## 2021-10-04 DIAGNOSIS — I5022 Chronic systolic (congestive) heart failure: Secondary | ICD-10-CM | POA: Diagnosis not present

## 2021-10-04 DIAGNOSIS — S065X3S Traumatic subdural hemorrhage with loss of consciousness of 1 hour to 5 hours 59 minutes, sequela: Secondary | ICD-10-CM | POA: Diagnosis not present

## 2021-10-04 DIAGNOSIS — I69354 Hemiplegia and hemiparesis following cerebral infarction affecting left non-dominant side: Secondary | ICD-10-CM | POA: Diagnosis not present

## 2021-10-04 DIAGNOSIS — R41840 Attention and concentration deficit: Secondary | ICD-10-CM | POA: Diagnosis not present

## 2021-10-04 DIAGNOSIS — R278 Other lack of coordination: Secondary | ICD-10-CM | POA: Diagnosis not present

## 2021-10-04 DIAGNOSIS — H538 Other visual disturbances: Secondary | ICD-10-CM | POA: Diagnosis not present

## 2021-10-04 DIAGNOSIS — G4089 Other seizures: Secondary | ICD-10-CM | POA: Diagnosis not present

## 2021-10-04 DIAGNOSIS — I69391 Dysphagia following cerebral infarction: Secondary | ICD-10-CM | POA: Diagnosis not present

## 2021-10-04 DIAGNOSIS — I38 Endocarditis, valve unspecified: Secondary | ICD-10-CM | POA: Diagnosis not present

## 2021-10-04 DIAGNOSIS — I69812 Visuospatial deficit and spatial neglect following other cerebrovascular disease: Secondary | ICD-10-CM | POA: Diagnosis not present

## 2021-10-04 DIAGNOSIS — M6389 Disorders of muscle in diseases classified elsewhere, multiple sites: Secondary | ICD-10-CM | POA: Diagnosis not present

## 2021-10-04 LAB — BASIC METABOLIC PANEL
BUN: 19 (ref 4–21)
CO2: 23 — AB (ref 13–22)
Chloride: 105 (ref 99–108)
Creatinine: 0.8 (ref 0.6–1.3)
Glucose: 93
Potassium: 4.1 mEq/L (ref 3.5–5.1)
Sodium: 143 (ref 137–147)

## 2021-10-04 LAB — COMPREHENSIVE METABOLIC PANEL: Calcium: 9.6 (ref 8.7–10.7)

## 2021-10-05 ENCOUNTER — Ambulatory Visit: Payer: Medicare Other | Admitting: Neurology

## 2021-10-05 DIAGNOSIS — R293 Abnormal posture: Secondary | ICD-10-CM | POA: Diagnosis not present

## 2021-10-05 DIAGNOSIS — I69812 Visuospatial deficit and spatial neglect following other cerebrovascular disease: Secondary | ICD-10-CM | POA: Diagnosis not present

## 2021-10-05 DIAGNOSIS — S065X3S Traumatic subdural hemorrhage with loss of consciousness of 1 hour to 5 hours 59 minutes, sequela: Secondary | ICD-10-CM | POA: Diagnosis not present

## 2021-10-05 DIAGNOSIS — R278 Other lack of coordination: Secondary | ICD-10-CM | POA: Diagnosis not present

## 2021-10-05 DIAGNOSIS — M6389 Disorders of muscle in diseases classified elsewhere, multiple sites: Secondary | ICD-10-CM | POA: Diagnosis not present

## 2021-10-05 DIAGNOSIS — I5022 Chronic systolic (congestive) heart failure: Secondary | ICD-10-CM | POA: Diagnosis not present

## 2021-10-05 DIAGNOSIS — H538 Other visual disturbances: Secondary | ICD-10-CM | POA: Diagnosis not present

## 2021-10-05 DIAGNOSIS — I69354 Hemiplegia and hemiparesis following cerebral infarction affecting left non-dominant side: Secondary | ICD-10-CM | POA: Diagnosis not present

## 2021-10-05 DIAGNOSIS — G4089 Other seizures: Secondary | ICD-10-CM | POA: Diagnosis not present

## 2021-10-05 DIAGNOSIS — R41840 Attention and concentration deficit: Secondary | ICD-10-CM | POA: Diagnosis not present

## 2021-10-06 ENCOUNTER — Encounter: Payer: Self-pay | Admitting: Physical Medicine & Rehabilitation

## 2021-10-06 ENCOUNTER — Encounter: Payer: Medicare Other | Attending: Physical Medicine & Rehabilitation | Admitting: Physical Medicine & Rehabilitation

## 2021-10-06 VITALS — BP 113/71 | HR 70

## 2021-10-06 DIAGNOSIS — I69812 Visuospatial deficit and spatial neglect following other cerebrovascular disease: Secondary | ICD-10-CM | POA: Diagnosis not present

## 2021-10-06 DIAGNOSIS — I6601 Occlusion and stenosis of right middle cerebral artery: Secondary | ICD-10-CM

## 2021-10-06 DIAGNOSIS — R293 Abnormal posture: Secondary | ICD-10-CM | POA: Diagnosis not present

## 2021-10-06 DIAGNOSIS — G4089 Other seizures: Secondary | ICD-10-CM | POA: Diagnosis not present

## 2021-10-06 DIAGNOSIS — M6389 Disorders of muscle in diseases classified elsewhere, multiple sites: Secondary | ICD-10-CM | POA: Diagnosis not present

## 2021-10-06 DIAGNOSIS — S065X3S Traumatic subdural hemorrhage with loss of consciousness of 1 hour to 5 hours 59 minutes, sequela: Secondary | ICD-10-CM | POA: Diagnosis not present

## 2021-10-06 DIAGNOSIS — H538 Other visual disturbances: Secondary | ICD-10-CM | POA: Diagnosis not present

## 2021-10-06 DIAGNOSIS — R41841 Cognitive communication deficit: Secondary | ICD-10-CM | POA: Diagnosis not present

## 2021-10-06 DIAGNOSIS — I63412 Cerebral infarction due to embolism of left middle cerebral artery: Secondary | ICD-10-CM | POA: Diagnosis not present

## 2021-10-06 DIAGNOSIS — I69391 Dysphagia following cerebral infarction: Secondary | ICD-10-CM | POA: Diagnosis not present

## 2021-10-06 DIAGNOSIS — R41840 Attention and concentration deficit: Secondary | ICD-10-CM | POA: Diagnosis not present

## 2021-10-06 DIAGNOSIS — I69354 Hemiplegia and hemiparesis following cerebral infarction affecting left non-dominant side: Secondary | ICD-10-CM | POA: Diagnosis not present

## 2021-10-06 DIAGNOSIS — G8114 Spastic hemiplegia affecting left nondominant side: Secondary | ICD-10-CM

## 2021-10-06 DIAGNOSIS — R278 Other lack of coordination: Secondary | ICD-10-CM | POA: Diagnosis not present

## 2021-10-06 DIAGNOSIS — I5022 Chronic systolic (congestive) heart failure: Secondary | ICD-10-CM | POA: Diagnosis not present

## 2021-10-06 NOTE — Patient Instructions (Signed)
I WILL REACH OUT TO DR. Marigene Ehlers REGARDING RITALIN TRIAL FOR [name] [name].

## 2021-10-06 NOTE — Progress Notes (Signed)
Subjective:    Patient ID: [name] [name], male    DOB: 01/16/1941, 81 y.o.   MRN: [id number]  HPI  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  At last visit 2 months ago or so we injected 300 units of Botox into his chest and wrist and finger flexors.  He had good results especially with his chest range of motion.  He still is struggling with wrist tightness and finger tightness.  He also looked at new vigil as a stimulant for him but cost was prohibitive and it was not covered by insurance.  Patient is working with therapy on range of motion and more activities of basic mobility.  His arousal can wax and wane although he has his moments where he is very attentive and alert.  His wife asks if he might be appropriate for counseling in the near future.  Pain Inventory Average Pain 0 Pain Right Now 0 My pain is  no pain  LOCATION OF PAIN  no pain  BOWEL Number of stools per week: 14 Oral laxative use Yes  Type of laxative miralax Enema or suppository use No  History of colostomy No  Incontinent Yes   BLADDER Pads In and out cath, frequency . Able to self cath  . Bladder incontinence Yes  Frequent urination No  Leakage with coughing No  Difficulty starting stream Yes  Incomplete bladder emptying Yes    Mobility ability to climb steps?  no do you drive?  no use a wheelchair needs help with transfers  Function retired I need assistance with the following:  feeding, dressing, bathing, and toileting  Neuro/Psych bladder control problems bowel control problems spasms confusion  Prior Studies Any changes since last visit?  no  Physicians involved in your care Any changes since last visit?  no   Family History  Problem Relation Age of Onset   Macular degeneration Mother        macular degeneration   Cancer Father        intestinal/GI   Diabetes Paternal  Aunt    Heart attack Maternal Grandfather    Diabetes Brother    Social History   Socioeconomic History   Marital status: Married    Spouse name: Herbert Pun   Number of children: 2   Years of education: BS   Highest education level: Not on file  Occupational History   Occupation: retired  Tobacco Use   Smoking status: Never   Smokeless tobacco: Never  Vaping Use   Vaping Use: Never used  Substance and Sexual Activity   Alcohol use: Never    Comment: quit 5/22   Drug use: Never   Sexual activity: Not Currently    Partners: Female  Other Topics Concern   Not on file  Social History Narrative   10/27/20 residing at [facility] care center since 09/17/20    [employer] - BS, JD. married - '67. 2 sons - '74, '77  one son is a Nurse, learning disability for EMS - [employer]; 4 grandchildren. 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 marriage in good health. End-of-life: discussed issues and provided packet with the MOST form and out of facility order.      Caffeine: decaf coffee   Social Determinants of Health   Financial Resource Strain: Low Risk  (06/27/2018)   Overall Financial  Resource Strain (CARDIA)    Difficulty of Paying Living Expenses: Not hard at all  Food Insecurity: No Food Insecurity (06/27/2018)   Hunger Vital Sign    Worried About Running Out of Food in the Last Year: Never true    Ran Out of Food in the Last Year: Never true  Transportation Needs: No Transportation Needs (06/27/2018)   PRAPARE - Hydrologist (Medical): No    Lack of Transportation (Non-Medical): No  Physical Activity: Not on file  Stress: Not on file  Social Connections: Not on file   Past Surgical History:  Procedure Laterality Date   AORTIC AND MITRAL VALVE REPLACEMENT     09/2004   CARDIOVERSION     3 times from 2004-2006   CARDIOVERSION N/A 09/26/2013   Procedure: CARDIOVERSION;  Surgeon: Larey Dresser, MD;  Location: [place];  Service: Cardiovascular;  Laterality: N/A;   CARDIOVERSION N/A 06/19/2014   Procedure: CARDIOVERSION;  Surgeon: Jerline Pain, MD;  Location: [place];  Service: Cardiovascular;  Laterality: N/A;   CARDIOVERSION N/A 04/24/2017   Procedure: CARDIOVERSION;  Surgeon: Larey Dresser, MD;  Location: [place];  Service: Cardiovascular;  Laterality: N/A;   CARDIOVERSION N/A 06/08/2017   Procedure: CARDIOVERSION;  Surgeon: Pixie Casino, MD;  Location: [facility] ENDOSCOPY;  Service: Cardiovascular;  Laterality: N/A;   CARDIOVERSION N/A 08/24/2017   Procedure: CARDIOVERSION;  Surgeon: Skeet Latch, MD;  Location: [facility] ENDOSCOPY;  Service: Cardiovascular;  Laterality: N/A;   CARDIOVERSION N/A 02/14/2018   Procedure: CARDIOVERSION;  Surgeon: Larey Dresser, MD;  Location: [place];  Service: Cardiovascular;  Laterality: N/A;   COLONOSCOPY     CRANIOTOMY N/A 07/28/2020   Procedure: CRANIOTOMY FOR EVACUATION OF SUBDURAL HEMATOMA;  Surgeon: Eustace Steger, MD;  Location: [place];  Service: Neurosurgery;  Laterality: N/A;   IR ANGIO VERTEBRAL SEL SUBCLAVIAN INNOMINATE UNI R MOD SED  08/07/2020   IR CT HEAD LTD  08/07/2020   IR PERCUTANEOUS ART THROMBECTOMY/INFUSION INTRACRANIAL INC DIAG ANGIO  08/07/2020   laminectomies     10/1975 and in 03/2005   RADIOLOGY WITH ANESTHESIA N/A 08/07/2020   Procedure: IR WITH ANESTHESIA;  Surgeon: Luanne Bras, MD;  Location: [place];  Service: Radiology;  Laterality: N/A;   TEE WITHOUT CARDIOVERSION N/A 12/18/2020   Procedure: TRANSESOPHAGEAL ECHOCARDIOGRAM (TEE);  Surgeon: Larey Dresser, MD;  Location: [facility] ENDOSCOPY;  Service: Cardiovascular;  Laterality: N/A;   [place] Right 04/03/2018   Procedure: TOTAL HIP ARTHROPLASTY ANTERIOR APPROACH;  Surgeon: Paralee Cancel, MD;  Location: [facility];  Service: Orthopedics;  Laterality: Right;  70 mins   Past Medical History:  Diagnosis Date   Acute  rheumatic heart disease, unspecified    childhood, age  84 & 10   Acute rheumatic pericarditis    Atrial fibrillation (HCC)    history   CHF (congestive heart failure) ([place])    Diverticulosis    Dysrhythmia    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   External hemorrhoids without mention of complication    H/O aortic valve replacement    H/O mitral valve replacement    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   Lesion of ulnar nerve    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    Other and unspecified hyperlipidemia    Pre-diabetes    Previous back surgery 1978, jan 2007   Psychosexual dysfunction with inhibited  sexual excitement    SOB (shortness of breath)    "with heavy exercise"   Stroke ([place]) 08/2013   "I WAS IN AFIB AND THREW A CLOT, THE EFFECTS WERE TRANSITORY AND DIDNT LAST BUT FOR 30 MINUTES"    Thoracic aortic aneurysm (HCC)    BP 113/71   Pulse 70   SpO2 98%   Opioid Risk Score:   Fall Risk Score:  `1  Depression screen [facility] 2/9     01/18/2021    3:14 PM 07/11/2019    1:36 PM 06/27/2018    2:47 PM 05/23/2017    7:55 AM 05/22/2017   10:19 AM 05/19/2016   10:36 AM 12/23/2013    7:52 AM  Depression screen PHQ 2/9  Decreased Interest 0 0 0 0 0 0 0  Down, Depressed, Hopeless 0 0 0 0 0 0 0  PHQ - 2 Score 0 0 0 0 0 0 0     Review of Systems     Objective:   Physical Exam  General: No acute distress. Sleeping when I arrived HEENT: NCAT, EOMI, oral membranes moist Cards: reg rate  Chest: normal effort Abdomen: Soft, NT, ND Skin: dry, intact Extremities: no edema Psych: pleasant and appropriate  Skin: intact.  Neuro: asleep initially.  Arouses to noxious stimuli such as range of motion of his left upper extremity.  Patient with 1-2 out of 4 tone in the left wrist and finger flexors especially digits 3 through 5 as well as biceps, trace tone at the pectoralis muscles.  Hamstrings are 1+ to 2 out of 4.  He does have movement in the left upper extremity at 1+ to 2 out  of 5.  Left lower extremity similar in 1-2 out of 5 but inconsistent.  He engages better with the right side and seems to move both fairly freely but does have some processing and motor planning issues there even. Musculoskeletal: It appears more neutral today      Assessment & Plan:    Medical Problem List and Plan: 1.  Lethargy and L hemiparesis with cognitive impairments secondary to R MCA stroke s/p previous/recent crani for R>>L SDHs             -Return for botox 400 u  LUE. (Hold on leg )   -Avoid oral antispasmodics due to his ongoing lethargy  2 .Mood: family feels that he's generally positive  -he is not yet appropriate for counseling 3. Sleep/wake, associated lethargy -no longer on trazodone -continue amantadine -would benefit from ritalin trial most likely but I will reach out to cardiology re: clearance first before we start 4. Seizure prophylaxis Vimpat per neurology 5. BPH/urine retention -tikosyn, finasteride, flomax per urology -Avoid use of Foley if possible. 6.  Nutrition: Encourage fluids, continue per primary   Fifteen minutes of face to face patient care time were spent during this visit. All questions were encouraged and answered.  Follow up with me next week. [name]        "

## 2021-10-07 DIAGNOSIS — M6389 Disorders of muscle in diseases classified elsewhere, multiple sites: Secondary | ICD-10-CM | POA: Diagnosis not present

## 2021-10-07 DIAGNOSIS — R41840 Attention and concentration deficit: Secondary | ICD-10-CM | POA: Diagnosis not present

## 2021-10-07 DIAGNOSIS — I5022 Chronic systolic (congestive) heart failure: Secondary | ICD-10-CM | POA: Diagnosis not present

## 2021-10-07 DIAGNOSIS — R293 Abnormal posture: Secondary | ICD-10-CM | POA: Diagnosis not present

## 2021-10-07 DIAGNOSIS — R278 Other lack of coordination: Secondary | ICD-10-CM | POA: Diagnosis not present

## 2021-10-07 DIAGNOSIS — I69391 Dysphagia following cerebral infarction: Secondary | ICD-10-CM | POA: Diagnosis not present

## 2021-10-07 DIAGNOSIS — S065X3S Traumatic subdural hemorrhage with loss of consciousness of 1 hour to 5 hours 59 minutes, sequela: Secondary | ICD-10-CM | POA: Diagnosis not present

## 2021-10-07 DIAGNOSIS — I69354 Hemiplegia and hemiparesis following cerebral infarction affecting left non-dominant side: Secondary | ICD-10-CM | POA: Diagnosis not present

## 2021-10-07 DIAGNOSIS — I69812 Visuospatial deficit and spatial neglect following other cerebrovascular disease: Secondary | ICD-10-CM | POA: Diagnosis not present

## 2021-10-07 DIAGNOSIS — G4089 Other seizures: Secondary | ICD-10-CM | POA: Diagnosis not present

## 2021-10-07 DIAGNOSIS — H538 Other visual disturbances: Secondary | ICD-10-CM | POA: Diagnosis not present

## 2021-10-07 DIAGNOSIS — R41841 Cognitive communication deficit: Secondary | ICD-10-CM | POA: Diagnosis not present

## 2021-10-08 DIAGNOSIS — H538 Other visual disturbances: Secondary | ICD-10-CM | POA: Diagnosis not present

## 2021-10-08 DIAGNOSIS — I5022 Chronic systolic (congestive) heart failure: Secondary | ICD-10-CM | POA: Diagnosis not present

## 2021-10-08 DIAGNOSIS — R41841 Cognitive communication deficit: Secondary | ICD-10-CM | POA: Diagnosis not present

## 2021-10-08 DIAGNOSIS — I69354 Hemiplegia and hemiparesis following cerebral infarction affecting left non-dominant side: Secondary | ICD-10-CM | POA: Diagnosis not present

## 2021-10-08 DIAGNOSIS — S065X3S Traumatic subdural hemorrhage with loss of consciousness of 1 hour to 5 hours 59 minutes, sequela: Secondary | ICD-10-CM | POA: Diagnosis not present

## 2021-10-08 DIAGNOSIS — R293 Abnormal posture: Secondary | ICD-10-CM | POA: Diagnosis not present

## 2021-10-08 DIAGNOSIS — I69391 Dysphagia following cerebral infarction: Secondary | ICD-10-CM | POA: Diagnosis not present

## 2021-10-08 DIAGNOSIS — R41840 Attention and concentration deficit: Secondary | ICD-10-CM | POA: Diagnosis not present

## 2021-10-08 DIAGNOSIS — G4089 Other seizures: Secondary | ICD-10-CM | POA: Diagnosis not present

## 2021-10-08 DIAGNOSIS — I69812 Visuospatial deficit and spatial neglect following other cerebrovascular disease: Secondary | ICD-10-CM | POA: Diagnosis not present

## 2021-10-08 DIAGNOSIS — M6389 Disorders of muscle in diseases classified elsewhere, multiple sites: Secondary | ICD-10-CM | POA: Diagnosis not present

## 2021-10-08 DIAGNOSIS — R278 Other lack of coordination: Secondary | ICD-10-CM | POA: Diagnosis not present

## 2021-10-11 DIAGNOSIS — S065X3S Traumatic subdural hemorrhage with loss of consciousness of 1 hour to 5 hours 59 minutes, sequela: Secondary | ICD-10-CM | POA: Diagnosis not present

## 2021-10-11 DIAGNOSIS — R278 Other lack of coordination: Secondary | ICD-10-CM | POA: Diagnosis not present

## 2021-10-11 DIAGNOSIS — I69354 Hemiplegia and hemiparesis following cerebral infarction affecting left non-dominant side: Secondary | ICD-10-CM | POA: Diagnosis not present

## 2021-10-11 DIAGNOSIS — H538 Other visual disturbances: Secondary | ICD-10-CM | POA: Diagnosis not present

## 2021-10-11 DIAGNOSIS — R41840 Attention and concentration deficit: Secondary | ICD-10-CM | POA: Diagnosis not present

## 2021-10-11 DIAGNOSIS — I69391 Dysphagia following cerebral infarction: Secondary | ICD-10-CM | POA: Diagnosis not present

## 2021-10-11 DIAGNOSIS — I69812 Visuospatial deficit and spatial neglect following other cerebrovascular disease: Secondary | ICD-10-CM | POA: Diagnosis not present

## 2021-10-11 DIAGNOSIS — R293 Abnormal posture: Secondary | ICD-10-CM | POA: Diagnosis not present

## 2021-10-11 DIAGNOSIS — R41841 Cognitive communication deficit: Secondary | ICD-10-CM | POA: Diagnosis not present

## 2021-10-11 DIAGNOSIS — I5022 Chronic systolic (congestive) heart failure: Secondary | ICD-10-CM | POA: Diagnosis not present

## 2021-10-11 DIAGNOSIS — G4089 Other seizures: Secondary | ICD-10-CM | POA: Diagnosis not present

## 2021-10-11 DIAGNOSIS — M6389 Disorders of muscle in diseases classified elsewhere, multiple sites: Secondary | ICD-10-CM | POA: Diagnosis not present

## 2021-10-12 DIAGNOSIS — S065X3S Traumatic subdural hemorrhage with loss of consciousness of 1 hour to 5 hours 59 minutes, sequela: Secondary | ICD-10-CM | POA: Diagnosis not present

## 2021-10-12 DIAGNOSIS — R41841 Cognitive communication deficit: Secondary | ICD-10-CM | POA: Diagnosis not present

## 2021-10-12 DIAGNOSIS — R41840 Attention and concentration deficit: Secondary | ICD-10-CM | POA: Diagnosis not present

## 2021-10-12 DIAGNOSIS — I5022 Chronic systolic (congestive) heart failure: Secondary | ICD-10-CM | POA: Diagnosis not present

## 2021-10-12 DIAGNOSIS — I69354 Hemiplegia and hemiparesis following cerebral infarction affecting left non-dominant side: Secondary | ICD-10-CM | POA: Diagnosis not present

## 2021-10-12 DIAGNOSIS — H538 Other visual disturbances: Secondary | ICD-10-CM | POA: Diagnosis not present

## 2021-10-12 DIAGNOSIS — M6389 Disorders of muscle in diseases classified elsewhere, multiple sites: Secondary | ICD-10-CM | POA: Diagnosis not present

## 2021-10-12 DIAGNOSIS — R278 Other lack of coordination: Secondary | ICD-10-CM | POA: Diagnosis not present

## 2021-10-12 DIAGNOSIS — R293 Abnormal posture: Secondary | ICD-10-CM | POA: Diagnosis not present

## 2021-10-12 DIAGNOSIS — I69812 Visuospatial deficit and spatial neglect following other cerebrovascular disease: Secondary | ICD-10-CM | POA: Diagnosis not present

## 2021-10-12 DIAGNOSIS — I69391 Dysphagia following cerebral infarction: Secondary | ICD-10-CM | POA: Diagnosis not present

## 2021-10-12 DIAGNOSIS — G4089 Other seizures: Secondary | ICD-10-CM | POA: Diagnosis not present

## 2021-10-12 DIAGNOSIS — I38 Endocarditis, valve unspecified: Secondary | ICD-10-CM | POA: Diagnosis not present

## 2021-10-12 LAB — BASIC METABOLIC PANEL
BUN: 18 (ref 4–21)
CO2: 25 — AB (ref 13–22)
Chloride: 106 (ref 99–108)
Creatinine: 0.7 (ref 0.6–1.3)
Glucose: 91
Potassium: 4.3 mEq/L (ref 3.5–5.1)
Sodium: 141 (ref 137–147)

## 2021-10-12 LAB — COMPREHENSIVE METABOLIC PANEL
Calcium: 9.3 (ref 8.7–10.7)
eGFR: >90

## 2021-10-13 ENCOUNTER — Encounter: Payer: Self-pay | Admitting: Physical Medicine & Rehabilitation

## 2021-10-13 ENCOUNTER — Encounter (HOSPITAL_BASED_OUTPATIENT_CLINIC_OR_DEPARTMENT_OTHER): Payer: Medicare Other | Admitting: Physical Medicine & Rehabilitation

## 2021-10-13 VITALS — BP 127/79 | HR 76 | Ht 67.0 in | Wt 145.0 lb

## 2021-10-13 DIAGNOSIS — I69391 Dysphagia following cerebral infarction: Secondary | ICD-10-CM | POA: Diagnosis not present

## 2021-10-13 DIAGNOSIS — G8114 Spastic hemiplegia affecting left nondominant side: Secondary | ICD-10-CM | POA: Diagnosis not present

## 2021-10-13 DIAGNOSIS — I69354 Hemiplegia and hemiparesis following cerebral infarction affecting left non-dominant side: Secondary | ICD-10-CM | POA: Diagnosis not present

## 2021-10-13 DIAGNOSIS — G4089 Other seizures: Secondary | ICD-10-CM | POA: Diagnosis not present

## 2021-10-13 DIAGNOSIS — I6601 Occlusion and stenosis of right middle cerebral artery: Secondary | ICD-10-CM | POA: Diagnosis not present

## 2021-10-13 DIAGNOSIS — R41841 Cognitive communication deficit: Secondary | ICD-10-CM | POA: Diagnosis not present

## 2021-10-13 NOTE — Progress Notes (Signed)
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 This includes bleeding, bruising, infection, excessive weakness, or medication side effects. A REMS form is on file and signed.  Needle: 44m injectable monopolar needle electrode    Number of units per muscle Pectoralis Major 60 units Pectoralis Minor 60 units Biceps 0 units Brachioradialis 0 units FCR 10 units FCU 10 units FDS 80 units FDP 80 units FPL 0 units Palmaris Longus 0 units Pronator Teres 0 units Pronator Quadratus 0 units Lumbricals 100 units All injections were done after obtaining appropriate EMG activity and after negative drawback for blood. The patient tolerated the procedure well. Post procedure instructions were given. Return in about 3 months (around 01/13/2022) for botox 400u rue.

## 2021-10-13 NOTE — Patient Instructions (Signed)
PLEASE FEEL FREE TO CALL OUR OFFICE WITH ANY PROBLEMS OR QUESTIONS ([phone])      

## 2021-10-14 DIAGNOSIS — R41841 Cognitive communication deficit: Secondary | ICD-10-CM | POA: Diagnosis not present

## 2021-10-14 DIAGNOSIS — H538 Other visual disturbances: Secondary | ICD-10-CM | POA: Diagnosis not present

## 2021-10-14 DIAGNOSIS — R278 Other lack of coordination: Secondary | ICD-10-CM | POA: Diagnosis not present

## 2021-10-14 DIAGNOSIS — R41840 Attention and concentration deficit: Secondary | ICD-10-CM | POA: Diagnosis not present

## 2021-10-14 DIAGNOSIS — M6389 Disorders of muscle in diseases classified elsewhere, multiple sites: Secondary | ICD-10-CM | POA: Diagnosis not present

## 2021-10-14 DIAGNOSIS — G4089 Other seizures: Secondary | ICD-10-CM | POA: Diagnosis not present

## 2021-10-14 DIAGNOSIS — I69812 Visuospatial deficit and spatial neglect following other cerebrovascular disease: Secondary | ICD-10-CM | POA: Diagnosis not present

## 2021-10-14 DIAGNOSIS — I69391 Dysphagia following cerebral infarction: Secondary | ICD-10-CM | POA: Diagnosis not present

## 2021-10-14 DIAGNOSIS — I69354 Hemiplegia and hemiparesis following cerebral infarction affecting left non-dominant side: Secondary | ICD-10-CM | POA: Diagnosis not present

## 2021-10-14 DIAGNOSIS — R293 Abnormal posture: Secondary | ICD-10-CM | POA: Diagnosis not present

## 2021-10-14 DIAGNOSIS — I5022 Chronic systolic (congestive) heart failure: Secondary | ICD-10-CM | POA: Diagnosis not present

## 2021-10-14 DIAGNOSIS — S065X3S Traumatic subdural hemorrhage with loss of consciousness of 1 hour to 5 hours 59 minutes, sequela: Secondary | ICD-10-CM | POA: Diagnosis not present

## 2021-10-14 LAB — BASIC METABOLIC PANEL
BUN: 18 (ref 4–21)
CO2: 25 — AB (ref 13–22)
Chloride: 106 (ref 99–108)
Creatinine: 0.7 (ref 0.6–1.3)
Glucose: 91
Potassium: 4.3 mEq/L (ref 3.5–5.1)
Sodium: 141 (ref 137–147)

## 2021-10-14 LAB — COMPREHENSIVE METABOLIC PANEL
Calcium: 9.3 (ref 8.7–10.7)
eGFR: >90

## 2021-10-15 DIAGNOSIS — R293 Abnormal posture: Secondary | ICD-10-CM | POA: Diagnosis not present

## 2021-10-15 DIAGNOSIS — I5022 Chronic systolic (congestive) heart failure: Secondary | ICD-10-CM | POA: Diagnosis not present

## 2021-10-15 DIAGNOSIS — I69812 Visuospatial deficit and spatial neglect following other cerebrovascular disease: Secondary | ICD-10-CM | POA: Diagnosis not present

## 2021-10-15 DIAGNOSIS — G4089 Other seizures: Secondary | ICD-10-CM | POA: Diagnosis not present

## 2021-10-15 DIAGNOSIS — R278 Other lack of coordination: Secondary | ICD-10-CM | POA: Diagnosis not present

## 2021-10-15 DIAGNOSIS — S065X3S Traumatic subdural hemorrhage with loss of consciousness of 1 hour to 5 hours 59 minutes, sequela: Secondary | ICD-10-CM | POA: Diagnosis not present

## 2021-10-15 DIAGNOSIS — I69391 Dysphagia following cerebral infarction: Secondary | ICD-10-CM | POA: Diagnosis not present

## 2021-10-15 DIAGNOSIS — R41841 Cognitive communication deficit: Secondary | ICD-10-CM | POA: Diagnosis not present

## 2021-10-15 DIAGNOSIS — H538 Other visual disturbances: Secondary | ICD-10-CM | POA: Diagnosis not present

## 2021-10-15 DIAGNOSIS — I69354 Hemiplegia and hemiparesis following cerebral infarction affecting left non-dominant side: Secondary | ICD-10-CM | POA: Diagnosis not present

## 2021-10-15 DIAGNOSIS — M6389 Disorders of muscle in diseases classified elsewhere, multiple sites: Secondary | ICD-10-CM | POA: Diagnosis not present

## 2021-10-15 DIAGNOSIS — R41840 Attention and concentration deficit: Secondary | ICD-10-CM | POA: Diagnosis not present

## 2021-10-18 DIAGNOSIS — I5022 Chronic systolic (congestive) heart failure: Secondary | ICD-10-CM | POA: Diagnosis not present

## 2021-10-18 DIAGNOSIS — I69391 Dysphagia following cerebral infarction: Secondary | ICD-10-CM | POA: Diagnosis not present

## 2021-10-18 DIAGNOSIS — G4089 Other seizures: Secondary | ICD-10-CM | POA: Diagnosis not present

## 2021-10-18 DIAGNOSIS — S065X3S Traumatic subdural hemorrhage with loss of consciousness of 1 hour to 5 hours 59 minutes, sequela: Secondary | ICD-10-CM | POA: Diagnosis not present

## 2021-10-18 DIAGNOSIS — M6389 Disorders of muscle in diseases classified elsewhere, multiple sites: Secondary | ICD-10-CM | POA: Diagnosis not present

## 2021-10-18 DIAGNOSIS — R278 Other lack of coordination: Secondary | ICD-10-CM | POA: Diagnosis not present

## 2021-10-18 DIAGNOSIS — I69354 Hemiplegia and hemiparesis following cerebral infarction affecting left non-dominant side: Secondary | ICD-10-CM | POA: Diagnosis not present

## 2021-10-18 DIAGNOSIS — R293 Abnormal posture: Secondary | ICD-10-CM | POA: Diagnosis not present

## 2021-10-18 DIAGNOSIS — I69812 Visuospatial deficit and spatial neglect following other cerebrovascular disease: Secondary | ICD-10-CM | POA: Diagnosis not present

## 2021-10-18 DIAGNOSIS — H538 Other visual disturbances: Secondary | ICD-10-CM | POA: Diagnosis not present

## 2021-10-18 DIAGNOSIS — I38 Endocarditis, valve unspecified: Secondary | ICD-10-CM | POA: Diagnosis not present

## 2021-10-18 DIAGNOSIS — R41840 Attention and concentration deficit: Secondary | ICD-10-CM | POA: Diagnosis not present

## 2021-10-18 DIAGNOSIS — R41841 Cognitive communication deficit: Secondary | ICD-10-CM | POA: Diagnosis not present

## 2021-10-18 LAB — BASIC METABOLIC PANEL
BUN: 20 (ref 4–21)
CO2: 23 — AB (ref 13–22)
Chloride: 105 (ref 99–108)
Creatinine: 0.8 (ref 0.6–1.3)
Glucose: 89
Potassium: 4.3 mEq/L (ref 3.5–5.1)
Sodium: 139 (ref 137–147)

## 2021-10-18 LAB — COMPREHENSIVE METABOLIC PANEL
Calcium: 9.5 (ref 8.7–10.7)
eGFR: >90

## 2021-10-19 DIAGNOSIS — R41841 Cognitive communication deficit: Secondary | ICD-10-CM | POA: Diagnosis not present

## 2021-10-19 DIAGNOSIS — H538 Other visual disturbances: Secondary | ICD-10-CM | POA: Diagnosis not present

## 2021-10-19 DIAGNOSIS — I69391 Dysphagia following cerebral infarction: Secondary | ICD-10-CM | POA: Diagnosis not present

## 2021-10-19 DIAGNOSIS — R41840 Attention and concentration deficit: Secondary | ICD-10-CM | POA: Diagnosis not present

## 2021-10-19 DIAGNOSIS — I5022 Chronic systolic (congestive) heart failure: Secondary | ICD-10-CM | POA: Diagnosis not present

## 2021-10-19 DIAGNOSIS — I69354 Hemiplegia and hemiparesis following cerebral infarction affecting left non-dominant side: Secondary | ICD-10-CM | POA: Diagnosis not present

## 2021-10-19 DIAGNOSIS — I69812 Visuospatial deficit and spatial neglect following other cerebrovascular disease: Secondary | ICD-10-CM | POA: Diagnosis not present

## 2021-10-19 DIAGNOSIS — S065X3S Traumatic subdural hemorrhage with loss of consciousness of 1 hour to 5 hours 59 minutes, sequela: Secondary | ICD-10-CM | POA: Diagnosis not present

## 2021-10-19 DIAGNOSIS — R293 Abnormal posture: Secondary | ICD-10-CM | POA: Diagnosis not present

## 2021-10-19 DIAGNOSIS — G4089 Other seizures: Secondary | ICD-10-CM | POA: Diagnosis not present

## 2021-10-19 DIAGNOSIS — M6389 Disorders of muscle in diseases classified elsewhere, multiple sites: Secondary | ICD-10-CM | POA: Diagnosis not present

## 2021-10-19 DIAGNOSIS — R278 Other lack of coordination: Secondary | ICD-10-CM | POA: Diagnosis not present

## 2021-10-20 DIAGNOSIS — I69354 Hemiplegia and hemiparesis following cerebral infarction affecting left non-dominant side: Secondary | ICD-10-CM | POA: Diagnosis not present

## 2021-10-20 DIAGNOSIS — R293 Abnormal posture: Secondary | ICD-10-CM | POA: Diagnosis not present

## 2021-10-20 DIAGNOSIS — M6389 Disorders of muscle in diseases classified elsewhere, multiple sites: Secondary | ICD-10-CM | POA: Diagnosis not present

## 2021-10-20 DIAGNOSIS — H538 Other visual disturbances: Secondary | ICD-10-CM | POA: Diagnosis not present

## 2021-10-20 DIAGNOSIS — I69391 Dysphagia following cerebral infarction: Secondary | ICD-10-CM | POA: Diagnosis not present

## 2021-10-20 DIAGNOSIS — R41841 Cognitive communication deficit: Secondary | ICD-10-CM | POA: Diagnosis not present

## 2021-10-20 DIAGNOSIS — R278 Other lack of coordination: Secondary | ICD-10-CM | POA: Diagnosis not present

## 2021-10-20 DIAGNOSIS — G4089 Other seizures: Secondary | ICD-10-CM | POA: Diagnosis not present

## 2021-10-20 DIAGNOSIS — I69812 Visuospatial deficit and spatial neglect following other cerebrovascular disease: Secondary | ICD-10-CM | POA: Diagnosis not present

## 2021-10-20 DIAGNOSIS — S065X3S Traumatic subdural hemorrhage with loss of consciousness of 1 hour to 5 hours 59 minutes, sequela: Secondary | ICD-10-CM | POA: Diagnosis not present

## 2021-10-20 DIAGNOSIS — R41840 Attention and concentration deficit: Secondary | ICD-10-CM | POA: Diagnosis not present

## 2021-10-20 DIAGNOSIS — I5022 Chronic systolic (congestive) heart failure: Secondary | ICD-10-CM | POA: Diagnosis not present

## 2021-10-21 ENCOUNTER — Encounter: Payer: Self-pay | Admitting: Adult Health

## 2021-10-21 ENCOUNTER — Non-Acute Institutional Stay (SKILLED_NURSING_FACILITY): Payer: Medicare Other | Admitting: Adult Health

## 2021-10-21 DIAGNOSIS — E118 Type 2 diabetes mellitus with unspecified complications: Secondary | ICD-10-CM

## 2021-10-21 DIAGNOSIS — M6389 Disorders of muscle in diseases classified elsewhere, multiple sites: Secondary | ICD-10-CM | POA: Diagnosis not present

## 2021-10-21 DIAGNOSIS — N400 Enlarged prostate without lower urinary tract symptoms: Secondary | ICD-10-CM | POA: Diagnosis not present

## 2021-10-21 DIAGNOSIS — R569 Unspecified convulsions: Secondary | ICD-10-CM | POA: Diagnosis not present

## 2021-10-21 DIAGNOSIS — I69354 Hemiplegia and hemiparesis following cerebral infarction affecting left non-dominant side: Secondary | ICD-10-CM

## 2021-10-21 DIAGNOSIS — G4089 Other seizures: Secondary | ICD-10-CM | POA: Diagnosis not present

## 2021-10-21 DIAGNOSIS — R293 Abnormal posture: Secondary | ICD-10-CM | POA: Diagnosis not present

## 2021-10-21 DIAGNOSIS — T826XXD Infection and inflammatory reaction due to cardiac valve prosthesis, subsequent encounter: Secondary | ICD-10-CM | POA: Diagnosis not present

## 2021-10-21 DIAGNOSIS — I10 Essential (primary) hypertension: Secondary | ICD-10-CM

## 2021-10-21 DIAGNOSIS — R278 Other lack of coordination: Secondary | ICD-10-CM | POA: Diagnosis not present

## 2021-10-21 DIAGNOSIS — I69812 Visuospatial deficit and spatial neglect following other cerebrovascular disease: Secondary | ICD-10-CM | POA: Diagnosis not present

## 2021-10-21 DIAGNOSIS — G4733 Obstructive sleep apnea (adult) (pediatric): Secondary | ICD-10-CM

## 2021-10-21 DIAGNOSIS — S065X3S Traumatic subdural hemorrhage with loss of consciousness of 1 hour to 5 hours 59 minutes, sequela: Secondary | ICD-10-CM

## 2021-10-21 DIAGNOSIS — I69391 Dysphagia following cerebral infarction: Secondary | ICD-10-CM | POA: Diagnosis not present

## 2021-10-21 DIAGNOSIS — K5901 Slow transit constipation: Secondary | ICD-10-CM | POA: Diagnosis not present

## 2021-10-21 DIAGNOSIS — I5022 Chronic systolic (congestive) heart failure: Secondary | ICD-10-CM | POA: Diagnosis not present

## 2021-10-21 DIAGNOSIS — R41841 Cognitive communication deficit: Secondary | ICD-10-CM | POA: Diagnosis not present

## 2021-10-21 DIAGNOSIS — I480 Paroxysmal atrial fibrillation: Secondary | ICD-10-CM | POA: Diagnosis not present

## 2021-10-21 DIAGNOSIS — R41840 Attention and concentration deficit: Secondary | ICD-10-CM | POA: Diagnosis not present

## 2021-10-21 DIAGNOSIS — I38 Endocarditis, valve unspecified: Secondary | ICD-10-CM

## 2021-10-21 DIAGNOSIS — R1312 Dysphagia, oropharyngeal phase: Secondary | ICD-10-CM | POA: Diagnosis not present

## 2021-10-21 DIAGNOSIS — H538 Other visual disturbances: Secondary | ICD-10-CM | POA: Diagnosis not present

## 2021-10-21 NOTE — Progress Notes (Signed)
Location:  [place] Room Number: 127A Place of Service:  SNF [phone]) Provider:  Royal Hawthorn, NP  [name], MD  Patient Care Team: [name], MD as PCP - General (Internal Medicine) Larey Dresser, MD as PCP - Cardiology (Cardiology) [name], MD as Consulting Physician (Ophthalmology) [name], MD (Internal Medicine)  Extended Emergency Contact Information Primary Emergency Contact: [name] Address: [address]          [place], [place] [address] [place] Phone: [phone] Mobile Phone: [phone] Relation: Spouse Secondary Emergency Contact: [name], [place] [address] [name] of [place] Phone: [phone] Relation: Son  Code Status:  DNR Goals of care: Advanced Directive information    09/13/2021   10:08 AM  Advanced Directives  Does Patient Have a Medical Advance Directive? Yes  Type of Paramedic of [place];Living will;Out of facility DNR (pink MOST or yellow form)  Does patient want to make changes to medical advance directive? No - Patient declined  Copy of [place] in Chart? Yes - validated most recent copy scanned in chart (See row information)     Chief Complaint  Patient presents with   Medical Management of Chronic Issues    Medical Management of Chronic Issues.     HPI:  Pt is a 81 y.o. male seen today for medical management of chronic diseases.    PMH includes syncope with fall in May of 2022, TBI with SDH with craniotomy, acute right MCA s/p thrombectomy, bioprosthetic mitral and aortic valve replacement, DM II, diastiolic CHF, BPH, and afib.   Sodium levels have remained stable Tolerating D 3 diet for dysphagia. No coughing or choking.  Weight is maintained.  No increase in edema or sob.  Wt Readings from Last 3 Encounters:  10/21/21 144 lb 9.6 oz (65.6 kg)  10/13/21 145  lb (65.8 kg)  09/13/21 144 lb (65.3 kg)   Having regular bowel movements.   Trying Ritalin for alertness due to TBI per PMR physician. Does seem more alert today. Pulse was monitored and remained in the 70s  Also receives botox for spastic hemiparesis.  He is using Bipap for sleep apnea. Trying a new mask due to leaks.  Has BPH and did require catheterization on 8/8 but otherwise is voiding in brief with no issues.  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    Other and unspecified hyperlipidemia    Pre-diabetes    Previous back surgery 1978, jan 2007   Psychosexual dysfunction with inhibited sexual excitement    SOB (shortness of breath)    "with heavy exercise"   Stroke ([place]) 08/2013   "I WAS IN AFIB AND THREW A CLOT, THE EFFECTS WERE TRANSITORY AND DIDNT LAST BUT FOR 30 MINUTES"  Thoracic aortic aneurysm [facility])    Past Surgical History:  Procedure Laterality Date   AORTIC AND MITRAL VALVE REPLACEMENT     09/2004   CARDIOVERSION     3 times from 2004-2006   CARDIOVERSION N/A 09/26/2013   Procedure: CARDIOVERSION;  Surgeon: Larey Dresser, MD;  Location: [place];  Service: Cardiovascular;  Laterality: N/A;   CARDIOVERSION N/A 06/19/2014   Procedure: CARDIOVERSION;  Surgeon: Jerline Pain, MD;  Location: [facility];  Service: Cardiovascular;  Laterality: N/A;   CARDIOVERSION N/A 04/24/2017   Procedure: CARDIOVERSION;  Surgeon: Larey Dresser, MD;  Location: [place];  Service:  Cardiovascular;  Laterality: N/A;   CARDIOVERSION N/A 06/08/2017   Procedure: CARDIOVERSION;  Surgeon: Pixie Casino, MD;  Location: [facility] ENDOSCOPY;  Service: Cardiovascular;  Laterality: N/A;   CARDIOVERSION N/A 08/24/2017   Procedure: CARDIOVERSION;  Surgeon: Skeet Latch, MD;  Location: [name] ENDOSCOPY;  Service: Cardiovascular;  Laterality: N/A;   CARDIOVERSION N/A 02/14/2018   Procedure: CARDIOVERSION;  Surgeon: Larey Dresser, MD;  Location: [facility] ENDOSCOPY;  Service: Cardiovascular;  Laterality: N/A;   COLONOSCOPY     CRANIOTOMY N/A 07/28/2020   Procedure: CRANIOTOMY FOR EVACUATION OF SUBDURAL HEMATOMA;  Surgeon: Eustace Flury, MD;  Location: [place];  Service: Neurosurgery;  Laterality: N/A;   IR ANGIO VERTEBRAL SEL SUBCLAVIAN INNOMINATE UNI R MOD SED  08/07/2020   IR CT HEAD LTD  08/07/2020   IR PERCUTANEOUS ART THROMBECTOMY/INFUSION INTRACRANIAL INC DIAG ANGIO  08/07/2020   laminectomies     10/1975 and in 03/2005   RADIOLOGY WITH ANESTHESIA N/A 08/07/2020   Procedure: IR WITH ANESTHESIA;  Surgeon: Luanne Bras, MD;  Location: [place];  Service: Radiology;  Laterality: N/A;   TEE WITHOUT CARDIOVERSION N/A 12/18/2020   Procedure: TRANSESOPHAGEAL ECHOCARDIOGRAM (TEE);  Surgeon: Larey Dresser, MD;  Location: [facility] ENDOSCOPY;  Service: Cardiovascular;  Laterality: N/A;   [place] Right 04/03/2018   Procedure: TOTAL HIP ARTHROPLASTY ANTERIOR APPROACH;  Surgeon: Paralee Cancel, MD;  Location: [facility];  Service: Orthopedics;  Laterality: Right;  70 mins    Allergies  Allergen Reactions   Naproxen Hives   No [facility] Food Allergies Other (See Comments)    Scallops - distress, nausea and vomitting    Outpatient Encounter Medications as of 10/21/2021  Medication Sig   acetaminophen (TYLENOL) 325 MG tablet Take 650 mg by mouth as needed for mild pain.   Amantadine HCl 100 MG tablet Take 100 mg by mouth 2 (two) times daily.   amLODipine (NORVASC) 5 MG  tablet Take 5 mg by mouth daily.   amoxicillin (AMOXIL) 500 MG capsule Take 500 mg by mouth 2 (two) times daily.  *NO STOP DATE* - per pharmacy can open capsule -Do not crush   apixaban (ELIQUIS) 5 MG TABS tablet Take 1 tablet (5 mg total) by mouth 2 (two) times daily.   Artificial Saliva (BIOTENE MOISTURIZING MOUTH MT) Use as directed 2 sprays in the mouth or throat in the morning, at noon, and at bedtime.   atorvastatin (LIPITOR) 80 MG tablet Take 1 tablet (80 mg total) by mouth daily.   bisacodyl (DULCOLAX) 10 MG suppository Place 1 suppository (10 mg total) rectally daily as needed for moderate constipation or mild constipation.   clobetasol (TEMOVATE) 0.05 % external solution Apply 1 application  topically as needed (itching).   diazePAM, 15 MG Dose, (VALTOCO 15 MG DOSE) 2 x 7.5 MG/0.1ML LQPK Place 7.5  mg into each nostril (total of 15 mg) for grand mal seizure lasting over 5 minutes. Can repeat once in 4 hours. Do Not use more than twice in 24 hours   dofetilide (TIKOSYN) 125 MCG capsule Take 3 capsules (375 mcg total) by mouth 2 (two) times daily.   famotidine (PEPCID) 20 MG tablet Take 20 mg by mouth at bedtime as needed for indigestion.   finasteride (PROSCAR) 5 MG tablet Take 1 tablet (5 mg total) by mouth daily.   Glucerna (GLUCERNA) LIQD Take 237 mLs by mouth daily.   hydrocortisone 2.5 % cream Apply 1 application. topically daily as needed (itching).   lacosamide (VIMPAT) 50 MG TABS tablet Take 1 tablet (50 mg total) by mouth 2 (two) times daily.   levalbuterol (XOPENEX) 0.63 MG/3ML nebulizer solution Take 0.63 mg by nebulization every 6 (six) hours as needed for wheezing or shortness of breath.   magnesium gluconate (MAGONATE) 500 MG tablet Take 1 tablet (500 mg total) by mouth daily.   methenamine (MANDELAMINE) 1 g tablet Take 1,000 mg by mouth 2 (two) times daily.   methylphenidate (RITALIN) 5 MG tablet Take 5 mg by mouth 2 (two) times daily.   Multiple Vitamin (MULTIVITAMIN WITH  MINERALS) TABS tablet Take 1 tablet by mouth daily.   Polyethyl Glycol-Propyl Glycol (SYSTANE) 0.4-0.3 % SOLN Place 2 drops into both eyes daily.   polyethylene glycol (MIRALAX / GLYCOLAX) 17 g packet Take 17 g by mouth every other day.   senna-docusate (SENOKOT-S) 8.6-50 MG tablet Take 1 tablet by mouth at bedtime as needed.   furosemide (LASIX) 20 MG tablet Take 20 mg by mouth daily. (Patient not taking: Reported on 10/21/2021)   mupirocin ointment (BACTROBAN) 2 % Apply 1 Application topically 3 (three) times daily. (Patient not taking: Reported on 10/21/2021)   omeprazole (PRILOSEC) 40 MG capsule Take 40 mg by mouth daily. (Patient not taking: Reported on 10/21/2021)   potassium chloride (KLOR-CON) 10 MEQ tablet Take 10 mEq by mouth daily. (Patient not taking: Reported on 10/21/2021)   No facility-administered encounter medications on file as of 10/21/2021.    Review of Systems  Unable to perform ROS: Patient nonverbal    Immunization History  Administered Date(s) Administered   Fluad Quad(high Dose 65+) 12/06/2018, 12/09/2020   Influenza Whole 12/25/2008, 01/05/2010, 12/30/2011   Influenza, High Dose Seasonal PF 12/14/2016   Influenza,inj,Quad PF,6+ Mos 12/19/2013   Influenza,inj,quad, With Preservative 11/22/2017   Influenza-Unspecified 12/14/2015, 12/24/2019   Moderna Covid-19 Vaccine Bivalent Booster 22yr & up 01/12/2021   Moderna SARS-COV2 Booster Vaccination 01/02/2020, 06/26/2020   Moderna Sars-Covid-2 Vaccination 04/08/2019, 05/05/2019   Pneumococcal Conjugate-13 12/19/2013   Pneumococcal Polysaccharide-23 09/24/2007, 05/22/2017   Td 12/25/2008   Tdap 03/22/2018   Zoster Recombinat (Shingrix) 07/16/2019, 10/28/2019   Zoster, Live 09/24/2007   Pertinent  Health Maintenance Due  Topic Date Due   FOOT EXAM  Never done   OPHTHALMOLOGY EXAM  Never done   URINE MICROALBUMIN  Never done   INFLUENZA VACCINE  10/05/2021   HEMOGLOBIN A1C  01/05/2022   COLONOSCOPY (Pts 45-44yr Insurance coverage will need to be confirmed)  Discontinued      08/10/2021   11:10 PM 08/11/2021    8:00 AM 08/11/2021    7:30 PM 08/12/2021    8:00 AM 10/13/2021    2:32 PM  FaBlairn the past year?     0  Patient Fall Risk Level High fall risk High fall risk High fall risk High  fall risk    Functional Status Survey:    Vitals:   10/21/21 1513  BP: 114/73  Pulse: 76  Resp: (!) 24  Temp: (!) 97.3 F (36.3 C)  TempSrc: Skin  SpO2: 98%  Weight: 144 lb 9.6 oz (65.6 kg)   Body mass index is 22.65 kg/m. Physical Exam Vitals and nursing note reviewed.  Constitutional:      General: He is not in acute distress.    Appearance: He is not diaphoretic.  HENT:     Head: Normocephalic and atraumatic.     Nose: Nose normal.     Mouth/Throat:     Mouth: Mucous membranes are moist.     Pharynx: Oropharynx is clear.  Eyes:     Conjunctiva/sclera: Conjunctivae normal.     Pupils: Pupils are equal, round, and reactive to light.  Neck:     Thyroid: No thyromegaly.     Vascular: No JVD.     Trachea: No tracheal deviation.  Cardiovascular:     Rate and Rhythm: Normal rate and regular rhythm.     Heart sounds: No murmur heard. Pulmonary:     Effort: Pulmonary effort is normal. No respiratory distress.     Breath sounds: Normal breath sounds. No wheezing.  Abdominal:     General: Bowel sounds are normal. There is no distension.     Palpations: Abdomen is soft.     Tenderness: There is no abdominal tenderness.  Musculoskeletal:     Right lower leg: No edema.     Left lower leg: No edema.  Lymphadenopathy:     Cervical: No cervical adenopathy.  Skin:    General: Skin is warm and dry.     Coloration: Skin is pale.  Neurological:     Mental Status: He is alert. Mental status is at baseline.     Comments: Tracks in room, left sided weakness. Not verbal, not able to f/c.    Psychiatric:     Comments: flat     Labs reviewed: Recent Labs    12/18/20 1630 12/19/20 0610  12/19/20 1632 12/20/20 0153 02/08/21 0000 02/09/21 0000 08/10/21 7824 08/10/21 0957 08/11/21 0408 08/12/21 0107 08/16/21 0000 10/04/21 0000 10/12/21 0000 10/18/21 0000  NA  --  139  --    < > 150*   < > 141   < > 140 142   < > 143 141 139  K  --  3.5  --    < > 3.7   < > 3.8   < > 3.6 3.9   < > 4.1 4.3 4.3  CL  --  108  --    < > 107   < > 111  --  109 113*   < > 105 106 105  CO2  --  23  --    < > 22   < > 16*  --  24 23   < > 23* 25* 23*  GLUCOSE  --  186*  --    < >  --    < > 148*  --  120* 143*  --   --   --   --   BUN  --  16  --    < > 23*   < > 22  --  17 17   < > '19 18 20  '$ CREATININE  --  0.76  --    < > 0.9   < > 1.18  --  0.81 0.83   < >  0.8 0.7 0.8  CALCIUM  --  7.7*  --    < > 9.9   < > 8.8*  --  8.6* 8.7*   < > 9.6 9.3 9.5  MG 1.9 2.0 1.9   < > 2.1  --   --   --  1.9 2.0  --   --   --   --   PHOS 3.1 2.7 2.4*  --   --   --   --   --   --   --   --   --   --   --    < > = values in this interval not displayed.   Recent Labs    07/12/21 1056 08/10/21 0937 08/11/21 0408 09/07/21 0000 09/08/21 0000  AST '22 29 27 24 24  '$ ALT '22 26 23 18 18  '$ ALKPHOS  --  93 80 105 105  BILITOT 0.7 0.5 0.8  --   --   PROT 7.2 6.4* 5.8*  --   --   ALBUMIN  --  3.3* 3.1* 3.5 3.5   Recent Labs    07/12/21 1056 08/10/21 0937 08/10/21 0957 08/11/21 0408 08/12/21 0107 09/07/21 0000 09/27/21 0000  WBC 9.0 16.1*  --  9.1 10.2 12.6 10.1  NEUTROABS 6,804 14.3*  --   --  7.8*  --   --   HGB 10.9* 10.0*   < > 9.4* 9.4* 10.2* 10.5*  HCT 33.9* 31.6*   < > 28.7* 29.8* 31* 32*  MCV 90.4 96.3  --  92.0 94.3  --   --   PLT 426* 238  --  218 227 289 348   < > = values in this interval not displayed.   Lab Results  Component Value Date   TSH 1.773 01/24/2021   Lab Results  Component Value Date   HGBA1C 5.7 07/05/2021   Lab Results  Component Value Date   CHOL 110 07/05/2021   HDL 43 07/05/2021   LDLCALC 57 07/05/2021   TRIG 51 07/05/2021   CHOLHDL 2.8 08/08/2020     Significant Diagnostic Results in last 30 days:  No results found.  Assessment/Plan  1. 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  Continues on amantadine   2. 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 Slow transit constipation Continue miralax qod and senokot prn   4. BPH without urinary obstruction Continue proscar Needs very infrequent catheterization   5. Obstructive sleep apnea syndrome Trying new bipap mask due to leaks  6. Oropharyngeal dysphagia Continue D 3 diet Asp prec   7. PAF (paroxysmal atrial fibrillation) ([place]) Followed by cardiology On tikosyn and Eliquis for CVA risk reduction   8. Prosthetic valve endocarditis, subsequent encounter On indefinite course of amoxicillin  9. Essential hypertension Controlled   10. Chronic systolic CHF (congestive heart failure) (HCC) Euvolemic Has not needed lasix.   11. Controlled type 2 diabetes mellitus with complication, without long-term current use of insulin ([place]) Lab Results  Component Value Date   HGBA1C 5.7 07/05/2021   Diet controlled   12. Seizure ([place]) No new Followed by neurology on vimpat   Family/ staff Communication: nurse, spoke to [facility] last week about med change.   Labs/tests ordered:  BMP weekly

## 2021-10-22 ENCOUNTER — Encounter: Payer: Self-pay | Admitting: Adult Health

## 2021-10-25 DIAGNOSIS — I69391 Dysphagia following cerebral infarction: Secondary | ICD-10-CM | POA: Diagnosis not present

## 2021-10-25 DIAGNOSIS — I69812 Visuospatial deficit and spatial neglect following other cerebrovascular disease: Secondary | ICD-10-CM | POA: Diagnosis not present

## 2021-10-25 DIAGNOSIS — M6389 Disorders of muscle in diseases classified elsewhere, multiple sites: Secondary | ICD-10-CM | POA: Diagnosis not present

## 2021-10-25 DIAGNOSIS — I5022 Chronic systolic (congestive) heart failure: Secondary | ICD-10-CM | POA: Diagnosis not present

## 2021-10-25 DIAGNOSIS — G4089 Other seizures: Secondary | ICD-10-CM | POA: Diagnosis not present

## 2021-10-25 DIAGNOSIS — R41840 Attention and concentration deficit: Secondary | ICD-10-CM | POA: Diagnosis not present

## 2021-10-25 DIAGNOSIS — R293 Abnormal posture: Secondary | ICD-10-CM | POA: Diagnosis not present

## 2021-10-25 DIAGNOSIS — I69354 Hemiplegia and hemiparesis following cerebral infarction affecting left non-dominant side: Secondary | ICD-10-CM | POA: Diagnosis not present

## 2021-10-25 DIAGNOSIS — R41841 Cognitive communication deficit: Secondary | ICD-10-CM | POA: Diagnosis not present

## 2021-10-25 DIAGNOSIS — I38 Endocarditis, valve unspecified: Secondary | ICD-10-CM | POA: Diagnosis not present

## 2021-10-25 DIAGNOSIS — H538 Other visual disturbances: Secondary | ICD-10-CM | POA: Diagnosis not present

## 2021-10-25 DIAGNOSIS — S065X3S Traumatic subdural hemorrhage with loss of consciousness of 1 hour to 5 hours 59 minutes, sequela: Secondary | ICD-10-CM | POA: Diagnosis not present

## 2021-10-25 DIAGNOSIS — R278 Other lack of coordination: Secondary | ICD-10-CM | POA: Diagnosis not present

## 2021-10-25 LAB — COMPREHENSIVE METABOLIC PANEL
Calcium: 9.2 (ref 8.7–10.7)
eGFR: >90

## 2021-10-25 LAB — BASIC METABOLIC PANEL
BUN: 21 (ref 4–21)
CO2: 22 (ref 13–22)
Chloride: 105 (ref 99–108)
Creatinine: 0.8 (ref 0.6–1.3)
Glucose: 81
Potassium: 3.9 mEq/L (ref 3.5–5.1)
Sodium: 143 (ref 137–147)

## 2021-10-26 DIAGNOSIS — M6389 Disorders of muscle in diseases classified elsewhere, multiple sites: Secondary | ICD-10-CM | POA: Diagnosis not present

## 2021-10-26 DIAGNOSIS — R41840 Attention and concentration deficit: Secondary | ICD-10-CM | POA: Diagnosis not present

## 2021-10-26 DIAGNOSIS — I5022 Chronic systolic (congestive) heart failure: Secondary | ICD-10-CM | POA: Diagnosis not present

## 2021-10-26 DIAGNOSIS — I69812 Visuospatial deficit and spatial neglect following other cerebrovascular disease: Secondary | ICD-10-CM | POA: Diagnosis not present

## 2021-10-26 DIAGNOSIS — I69354 Hemiplegia and hemiparesis following cerebral infarction affecting left non-dominant side: Secondary | ICD-10-CM | POA: Diagnosis not present

## 2021-10-26 DIAGNOSIS — H538 Other visual disturbances: Secondary | ICD-10-CM | POA: Diagnosis not present

## 2021-10-26 DIAGNOSIS — G4089 Other seizures: Secondary | ICD-10-CM | POA: Diagnosis not present

## 2021-10-26 DIAGNOSIS — I69391 Dysphagia following cerebral infarction: Secondary | ICD-10-CM | POA: Diagnosis not present

## 2021-10-26 DIAGNOSIS — R278 Other lack of coordination: Secondary | ICD-10-CM | POA: Diagnosis not present

## 2021-10-26 DIAGNOSIS — S065X3S Traumatic subdural hemorrhage with loss of consciousness of 1 hour to 5 hours 59 minutes, sequela: Secondary | ICD-10-CM | POA: Diagnosis not present

## 2021-10-26 DIAGNOSIS — R41841 Cognitive communication deficit: Secondary | ICD-10-CM | POA: Diagnosis not present

## 2021-10-26 DIAGNOSIS — R293 Abnormal posture: Secondary | ICD-10-CM | POA: Diagnosis not present

## 2021-10-27 ENCOUNTER — Encounter: Payer: Self-pay | Admitting: Internal Medicine

## 2021-10-27 DIAGNOSIS — R293 Abnormal posture: Secondary | ICD-10-CM | POA: Diagnosis not present

## 2021-10-27 DIAGNOSIS — R41840 Attention and concentration deficit: Secondary | ICD-10-CM | POA: Diagnosis not present

## 2021-10-27 DIAGNOSIS — R41841 Cognitive communication deficit: Secondary | ICD-10-CM | POA: Diagnosis not present

## 2021-10-27 DIAGNOSIS — G4089 Other seizures: Secondary | ICD-10-CM | POA: Diagnosis not present

## 2021-10-27 DIAGNOSIS — R278 Other lack of coordination: Secondary | ICD-10-CM | POA: Diagnosis not present

## 2021-10-27 DIAGNOSIS — I69812 Visuospatial deficit and spatial neglect following other cerebrovascular disease: Secondary | ICD-10-CM | POA: Diagnosis not present

## 2021-10-27 DIAGNOSIS — I69354 Hemiplegia and hemiparesis following cerebral infarction affecting left non-dominant side: Secondary | ICD-10-CM | POA: Diagnosis not present

## 2021-10-27 DIAGNOSIS — M6389 Disorders of muscle in diseases classified elsewhere, multiple sites: Secondary | ICD-10-CM | POA: Diagnosis not present

## 2021-10-27 DIAGNOSIS — I5022 Chronic systolic (congestive) heart failure: Secondary | ICD-10-CM | POA: Diagnosis not present

## 2021-10-27 DIAGNOSIS — I69391 Dysphagia following cerebral infarction: Secondary | ICD-10-CM | POA: Diagnosis not present

## 2021-10-27 DIAGNOSIS — S065X3S Traumatic subdural hemorrhage with loss of consciousness of 1 hour to 5 hours 59 minutes, sequela: Secondary | ICD-10-CM | POA: Diagnosis not present

## 2021-10-27 DIAGNOSIS — H538 Other visual disturbances: Secondary | ICD-10-CM | POA: Diagnosis not present

## 2021-10-28 DIAGNOSIS — M6389 Disorders of muscle in diseases classified elsewhere, multiple sites: Secondary | ICD-10-CM | POA: Diagnosis not present

## 2021-10-28 DIAGNOSIS — I69354 Hemiplegia and hemiparesis following cerebral infarction affecting left non-dominant side: Secondary | ICD-10-CM | POA: Diagnosis not present

## 2021-10-28 DIAGNOSIS — S065X3S Traumatic subdural hemorrhage with loss of consciousness of 1 hour to 5 hours 59 minutes, sequela: Secondary | ICD-10-CM | POA: Diagnosis not present

## 2021-10-28 DIAGNOSIS — I10 Essential (primary) hypertension: Secondary | ICD-10-CM | POA: Diagnosis not present

## 2021-10-28 DIAGNOSIS — R41841 Cognitive communication deficit: Secondary | ICD-10-CM | POA: Diagnosis not present

## 2021-10-28 DIAGNOSIS — I69812 Visuospatial deficit and spatial neglect following other cerebrovascular disease: Secondary | ICD-10-CM | POA: Diagnosis not present

## 2021-10-28 DIAGNOSIS — I5022 Chronic systolic (congestive) heart failure: Secondary | ICD-10-CM | POA: Diagnosis not present

## 2021-10-28 DIAGNOSIS — R293 Abnormal posture: Secondary | ICD-10-CM | POA: Diagnosis not present

## 2021-10-28 DIAGNOSIS — G4089 Other seizures: Secondary | ICD-10-CM | POA: Diagnosis not present

## 2021-10-28 DIAGNOSIS — R278 Other lack of coordination: Secondary | ICD-10-CM | POA: Diagnosis not present

## 2021-10-28 DIAGNOSIS — R41840 Attention and concentration deficit: Secondary | ICD-10-CM | POA: Diagnosis not present

## 2021-10-28 DIAGNOSIS — I69391 Dysphagia following cerebral infarction: Secondary | ICD-10-CM | POA: Diagnosis not present

## 2021-10-28 DIAGNOSIS — H538 Other visual disturbances: Secondary | ICD-10-CM | POA: Diagnosis not present

## 2021-10-28 LAB — CBC: RBC: 3.58 — AB (ref 3.87–5.11)

## 2021-10-28 LAB — CBC AND DIFFERENTIAL
HCT: 32 — AB (ref 41–53)
Hemoglobin: 10 — AB (ref 13.5–17.5)
Platelets: 241 10*3/uL (ref 150–400)
WBC: 9.9

## 2021-10-29 DIAGNOSIS — I69391 Dysphagia following cerebral infarction: Secondary | ICD-10-CM | POA: Diagnosis not present

## 2021-10-29 DIAGNOSIS — H538 Other visual disturbances: Secondary | ICD-10-CM | POA: Diagnosis not present

## 2021-10-29 DIAGNOSIS — I5022 Chronic systolic (congestive) heart failure: Secondary | ICD-10-CM | POA: Diagnosis not present

## 2021-10-29 DIAGNOSIS — I69812 Visuospatial deficit and spatial neglect following other cerebrovascular disease: Secondary | ICD-10-CM | POA: Diagnosis not present

## 2021-10-29 DIAGNOSIS — I69354 Hemiplegia and hemiparesis following cerebral infarction affecting left non-dominant side: Secondary | ICD-10-CM | POA: Diagnosis not present

## 2021-10-29 DIAGNOSIS — R293 Abnormal posture: Secondary | ICD-10-CM | POA: Diagnosis not present

## 2021-10-29 DIAGNOSIS — R41840 Attention and concentration deficit: Secondary | ICD-10-CM | POA: Diagnosis not present

## 2021-10-29 DIAGNOSIS — R41841 Cognitive communication deficit: Secondary | ICD-10-CM | POA: Diagnosis not present

## 2021-10-29 DIAGNOSIS — S065X3S Traumatic subdural hemorrhage with loss of consciousness of 1 hour to 5 hours 59 minutes, sequela: Secondary | ICD-10-CM | POA: Diagnosis not present

## 2021-10-29 DIAGNOSIS — M6389 Disorders of muscle in diseases classified elsewhere, multiple sites: Secondary | ICD-10-CM | POA: Diagnosis not present

## 2021-10-29 DIAGNOSIS — R278 Other lack of coordination: Secondary | ICD-10-CM | POA: Diagnosis not present

## 2021-10-29 DIAGNOSIS — G4089 Other seizures: Secondary | ICD-10-CM | POA: Diagnosis not present

## 2021-10-31 DIAGNOSIS — I5022 Chronic systolic (congestive) heart failure: Secondary | ICD-10-CM | POA: Diagnosis not present

## 2021-10-31 DIAGNOSIS — R0602 Shortness of breath: Secondary | ICD-10-CM | POA: Diagnosis not present

## 2021-10-31 LAB — BASIC METABOLIC PANEL
BUN: 23 — AB (ref 4–21)
CO2: 25 — AB (ref 13–22)
Chloride: 106 (ref 99–108)
Creatinine: 0.8 (ref 0.6–1.3)
Glucose: 103
Potassium: 4.1 mEq/L (ref 3.5–5.1)
Sodium: 138 (ref 137–147)

## 2021-10-31 LAB — CBC AND DIFFERENTIAL
HCT: 27 — AB (ref 41–53)
Hemoglobin: 9.1 — AB (ref 13.5–17.5)
Platelets: 228 10*3/uL (ref 150–400)
WBC: 9.3

## 2021-10-31 LAB — COMPREHENSIVE METABOLIC PANEL
Calcium: 8.7 (ref 8.7–10.7)
eGFR: 90

## 2021-10-31 LAB — CBC: RBC: 3.16 — AB (ref 3.87–5.11)

## 2021-11-01 ENCOUNTER — Non-Acute Institutional Stay (SKILLED_NURSING_FACILITY): Payer: Medicare Other | Admitting: Adult Health

## 2021-11-01 ENCOUNTER — Encounter: Payer: Self-pay | Admitting: Adult Health

## 2021-11-01 ENCOUNTER — Encounter: Payer: Self-pay | Admitting: Internal Medicine

## 2021-11-01 DIAGNOSIS — G4089 Other seizures: Secondary | ICD-10-CM | POA: Diagnosis not present

## 2021-11-01 DIAGNOSIS — I38 Endocarditis, valve unspecified: Secondary | ICD-10-CM | POA: Diagnosis not present

## 2021-11-01 DIAGNOSIS — R0902 Hypoxemia: Secondary | ICD-10-CM | POA: Diagnosis not present

## 2021-11-01 DIAGNOSIS — R293 Abnormal posture: Secondary | ICD-10-CM | POA: Diagnosis not present

## 2021-11-01 DIAGNOSIS — S065X3S Traumatic subdural hemorrhage with loss of consciousness of 1 hour to 5 hours 59 minutes, sequela: Secondary | ICD-10-CM | POA: Diagnosis not present

## 2021-11-01 DIAGNOSIS — H538 Other visual disturbances: Secondary | ICD-10-CM | POA: Diagnosis not present

## 2021-11-01 DIAGNOSIS — I5033 Acute on chronic diastolic (congestive) heart failure: Secondary | ICD-10-CM

## 2021-11-01 DIAGNOSIS — D638 Anemia in other chronic diseases classified elsewhere: Secondary | ICD-10-CM | POA: Diagnosis not present

## 2021-11-01 DIAGNOSIS — R278 Other lack of coordination: Secondary | ICD-10-CM | POA: Diagnosis not present

## 2021-11-01 DIAGNOSIS — R41840 Attention and concentration deficit: Secondary | ICD-10-CM | POA: Diagnosis not present

## 2021-11-01 DIAGNOSIS — I69354 Hemiplegia and hemiparesis following cerebral infarction affecting left non-dominant side: Secondary | ICD-10-CM | POA: Diagnosis not present

## 2021-11-01 DIAGNOSIS — I69812 Visuospatial deficit and spatial neglect following other cerebrovascular disease: Secondary | ICD-10-CM | POA: Diagnosis not present

## 2021-11-01 DIAGNOSIS — R41841 Cognitive communication deficit: Secondary | ICD-10-CM | POA: Diagnosis not present

## 2021-11-01 DIAGNOSIS — I5022 Chronic systolic (congestive) heart failure: Secondary | ICD-10-CM | POA: Diagnosis not present

## 2021-11-01 DIAGNOSIS — I10 Essential (primary) hypertension: Secondary | ICD-10-CM | POA: Diagnosis not present

## 2021-11-01 DIAGNOSIS — I69391 Dysphagia following cerebral infarction: Secondary | ICD-10-CM | POA: Diagnosis not present

## 2021-11-01 DIAGNOSIS — M6389 Disorders of muscle in diseases classified elsewhere, multiple sites: Secondary | ICD-10-CM | POA: Diagnosis not present

## 2021-11-01 LAB — CBC: RBC: 3.27 — AB (ref 3.87–5.11)

## 2021-11-01 LAB — COMPREHENSIVE METABOLIC PANEL
Calcium: 9.1 (ref 8.7–10.7)
eGFR: 88

## 2021-11-01 LAB — BASIC METABOLIC PANEL
BUN: 20 (ref 4–21)
CO2: 26 — AB (ref 13–22)
Chloride: 105 (ref 99–108)
Creatinine: 0.9 (ref 0.6–1.3)
Glucose: 94
Potassium: 4.5 mEq/L (ref 3.5–5.1)
Sodium: 140 (ref 137–147)

## 2021-11-01 LAB — CBC AND DIFFERENTIAL
HCT: 28 — AB (ref 41–53)
Hemoglobin: 9.4 — AB (ref 13.5–17.5)
Platelets: 258 10*3/uL (ref 150–400)
WBC: 8.9

## 2021-11-01 NOTE — Progress Notes (Addendum)
Location:  Occupational psychologist of Service:  SNF (31) Provider:   Cindi Carbon, [place] [phone]   [name], MD  Patient Care Team: [name], MD as PCP - General (Internal Medicine) Larey Dresser, MD as PCP - Cardiology (Cardiology) [name], MD as Consulting Physician (Ophthalmology) [name], MD (Internal Medicine)  Extended Emergency Contact Information Primary Emergency Contact: [name] Address: [address]          [place], [place] [address] [place] Phone: [phone] Mobile Phone: [phone] Relation: Spouse Secondary Emergency Contact: [name],  [address] [name] Phone: [phone] Relation: Son  Code Status:  DNR Goals of care: Advanced Directive information    09/13/2021   10:08 AM  Advanced Directives  Does Patient Have a Medical Advance Directive? Yes  Type of Par[facility] of [place];Living will;Out of facility DNR (pink MOST or yellow form)  Does patient want to make changes to medical advance directive? No - Patient declined  Copy of [place] in Chart? Yes - validated most recent copy scanned in chart (See row information)     Chief Complaint  Patient presents with  . Shortness of Breath    HPI:  Pt is a 81 y.o. male seen today for an acute visit for sob.PMH includes syncope with fall in May of 2022, TBI with SDH with craniotomy, acute right MCA s/p thrombectomy, bioprosthetic mitral and aortic valve replacement, DM II, diastiolic CHF, BPH, and afib.    Resident reported not to be feeling well on 8/24 with some cough and congestion. Then he had some wheezing and sob with sats in the high 80s on 8/26. Rapid covid was done and was negative. On 8/27 labs and CXR were ordered for sob, pale color, and weight gain.  CXR showed patchy bilateral interstitial  and alveolar opacities c/w pulmonary venous congestion. Superimposed pneumonitis could not be excluded.  BNP 118 , WBC 9.3 Hgb 9.1 MCV 85.9 plt 228 BMP NA 138 K 4.1 BUN 23 glucose 103, Cr 0.81 Ca 8.7  He does not have a runny nose, cough, or fever.  No sputum production. On life long amoxicillin for endocarditis.   Echo 03/31/21 EF 45-50%   He was noted to have heme pos stools on 7/10.  He is on eliquis for CVA risk reduction due to afib. He was placed on prilosec and anusol . Hgb remained stable in July.  Now Hgb 9.1. repeat hemoccult stool was reviewed by me and is negative for bleeding. There have been some subjective discrepancy in the readings. He is not a candidate for a colonoscopy.   Past Medical History:  Diagnosis Date  . Acute rheumatic heart disease, unspecified    childhood, age  15 & 45  . Acute rheumatic pericarditis   . Atrial fibrillation ([place])    history  . CHF (congestive heart failure) ([place])   . Diverticulosis   . Dysrhythmia   . 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 External hemorrhoids without mention of complication   . H/O aortic valve replacement   . H/O mitral valve replacement   . 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 Lesion of ulnar nerve   . 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 Other and unspecified hyperlipidemia   . Pre-diabetes   .  Previous back surgery 1978, jan 2007  . Psychosexual dysfunction with inhibited sexual excitement   . SOB (shortness of breath)    "with heavy exercise"  . Stroke ([place]) 08/2013   "I WAS IN AFIB AND THREW A CLOT, THE EFFECTS WERE TRANSITORY AND DIDNT LAST BUT FOR 30 MINUTES"   . Thoracic aortic aneurysm [facility])    Past Surgical History:  Procedure Laterality Date  . AORTIC AND MITRAL VALVE REPLACEMENT     09/2004  . CARDIOVERSION     3 times from 2004-2006  . CARDIOVERSION N/A 09/26/2013   Procedure: CARDIOVERSION;  Surgeon: Larey Dresser, MD;  Location: [place];  Service: Cardiovascular;   Laterality: N/A;  . CARDIOVERSION N/A 06/19/2014   Procedure: CARDIOVERSION;  Surgeon: Jerline Pain, MD;  Location: [place];  Service: Cardiovascular;  Laterality: N/A;  . CARDIOVERSION N/A 04/24/2017   Procedure: CARDIOVERSION;  Surgeon: Larey Dresser, MD;  Location: [facility] ENDOSCOPY;  Service: Cardiovascular;  Laterality: N/A;  . CARDIOVERSION N/A 06/08/2017   Procedure: CARDIOVERSION;  Surgeon: Pixie Casino, MD;  Location: [facility] ENDOSCOPY;  Service: Cardiovascular;  Laterality: N/A;  . CARDIOVERSION N/A 08/24/2017   Procedure: CARDIOVERSION;  Surgeon: Skeet Latch, MD;  Location: [facility] ENDOSCOPY;  Service: Cardiovascular;  Laterality: N/A;  . CARDIOVERSION N/A 02/14/2018   Procedure: CARDIOVERSION;  Surgeon: Larey Dresser, MD;  Location: [facility] ENDOSCOPY;  Service: Cardiovascular;  Laterality: N/A;  . COLONOSCOPY    . CRANIOTOMY N/A 07/28/2020   Procedure: CRANIOTOMY FOR EVACUATION OF SUBDURAL HEMATOMA;  Surgeon: Eustace Miotke, MD;  Location: [place];  Service: Neurosurgery;  Laterality: N/A;  . IR ANGIO VERTEBRAL SEL SUBCLAVIAN INNOMINATE UNI R MOD SED  08/07/2020  . IR CT HEAD LTD  08/07/2020  . IR PERCUTANEOUS ART THROMBECTOMY/INFUSION INTRACRANIAL INC DIAG ANGIO  08/07/2020  . laminectomies     10/1975 and in 03/2005  . RADIOLOGY WITH ANESTHESIA N/A 08/07/2020   Procedure: IR WITH ANESTHESIA;  Surgeon: Luanne Bras, MD;  Location: [place];  Service: Radiology;  Laterality: N/A;  . TEE WITHOUT CARDIOVERSION N/A 12/18/2020   Procedure: TRANSESOPHAGEAL ECHOCARDIOGRAM (TEE);  Surgeon: Larey Dresser, MD;  Location: [facility] ENDOSCOPY;  Service: Cardiovascular;  Laterality: N/A;  . TONSILLECTOMY     1950  . TOTAL HIP ARTHROPLASTY Right 04/03/2018   Procedure: TOTAL HIP ARTHROPLASTY ANTERIOR APPROACH;  Surgeon: Paralee Cancel, MD;  Location: [facility];  Service: Orthopedics;  Laterality: Right;  70 mins    Allergies  Allergen Reactions  . Naproxen Hives  . No [employer] Food Allergies Other (See  Comments)    Scallops - distress, nausea and vomitting    Outpatient Encounter Medications as of 11/01/2021  Medication Sig  . omeprazole (PRILOSEC) 20 MG capsule Take 20 mg by mouth daily.  [name] acetaminophen (TYLENOL) 325 MG tablet Take 650 mg by mouth as needed for mild pain.  . Amantadine HCl 100 MG tablet Take 100 mg by mouth 2 (two) times daily.  [name] amLODipine (NORVASC) 5 MG tablet Take 5 mg by mouth daily.  [name] amoxicillin (AMOXIL) 500 MG capsule Take 500 mg by mouth 2 (two) times daily.  *NO STOP DATE* - per pharmacy can open capsule -Do not crush  . apixaban (ELIQUIS) 5 MG TABS tablet Take 1 tablet (5 mg total) by mouth 2 (two) times daily.  . Artificial Saliva (BIOTENE MOISTURIZING MOUTH MT) Use as directed 2 sprays in the mouth or throat in the morning, at noon, and at bedtime.  [name] atorvastatin (  LIPITOR) 80 MG tablet Take 1 tablet (80 mg total) by mouth daily.  . bisacodyl (DULCOLAX) 10 MG suppository Place 1 suppository (10 mg total) rectally daily as needed for moderate constipation or mild constipation.  . clobetasol (TEMOVATE) 0.05 % external solution Apply 1 application  topically as needed (itching).  . diazePAM, 15 MG Dose, (VALTOCO 15 MG DOSE) 2 x 7.5 MG/0.1ML LQPK Place 7.5 mg into each nostril (total of 15 mg) for grand mal seizure lasting over 5 minutes. Can repeat once in 4 hours. Do Not use more than twice in 24 hours  . dofetilide (TIKOSYN) 125 MCG capsule Take 3 capsules (375 mcg total) by mouth 2 (two) times daily.  . famotidine (PEPCID) 20 MG tablet Take 20 mg by mouth at bedtime as needed for indigestion.  . finasteride (PROSCAR) 5 MG tablet Take 1 tablet (5 mg total) by mouth daily.  . Glucerna (GLUCERNA) LIQD Take 237 mLs by mouth daily.  . hydrocortisone 2.5 % cream Apply 1 application. topically daily as needed (itching).  [name] lacosamide (VIMPAT) 50 MG TABS tablet Take 1 tablet (50 mg total) by mouth 2 (two) times daily.  [name] levalbuterol (XOPENEX) 0.63 MG/3ML nebulizer  solution Take 0.63 mg by nebulization every 6 (six) hours as needed for wheezing or shortness of breath.  . magnesium gluconate (MAGONATE) 500 MG tablet Take 1 tablet (500 mg total) by mouth daily.  . methenamine (MANDELAMINE) 1 g tablet Take 1,000 mg by mouth 2 (two) times daily.  . methylphenidate (RITALIN) 5 MG tablet Take 5 mg by mouth 2 (two) times daily.  . Multiple Vitamin (MULTIVITAMIN WITH MINERALS) TABS tablet Take 1 tablet by mouth daily.  Vladimir Faster Glycol-Propyl Glycol (SYSTANE) 0.4-0.3 % SOLN Place 2 drops into both eyes daily.  . polyethylene glycol (MIRALAX / GLYCOLAX) 17 g packet Take 17 g by mouth every other day.  . senna-docusate (SENOKOT-S) 8.6-50 MG tablet Take 1 tablet by mouth at bedtime as needed.   No facility-administered encounter medications on file as of 11/01/2021.    Review of Systems  Unable to perform ROS: Dementia   Immunization History  Administered Date(s) Administered  . Fluad Quad(high Dose 65+) 12/06/2018, 12/09/2020  . Influenza Whole 12/25/2008, 01/05/2010, 12/30/2011  . Influenza, High Dose Seasonal PF 12/14/2016  . Influenza,inj,Quad PF,6+ Mos 12/19/2013  . Influenza,inj,quad, With Preservative 11/22/2017  . Influenza-Unspecified 12/14/2015, 12/24/2019  . Moderna Covid-19 Vaccine Bivalent Booster 40yr & up 01/12/2021  . Moderna SARS-COV2 Booster Vaccination 01/02/2020, 06/26/2020  . Moderna Sars-Covid-2 Vaccination 04/08/2019, 05/05/2019  . Pneumococcal Conjugate-13 12/19/2013  . Pneumococcal Polysaccharide-23 09/24/2007, 05/22/2017  . Td 12/25/2008  . Tdap 03/22/2018  . Zoster Recombinat (Shingrix) 07/16/2019, 10/28/2019  . Zoster, Live 09/24/2007   Pertinent  Health Maintenance Due  Topic Date Due  . INFLUENZA VACCINE  10/05/2021  . OPHTHALMOLOGY EXAM  10/06/2022 (Originally 10/04/1950)  . HEMOGLOBIN A1C  01/05/2022  . FOOT EXAM  10/23/2022  . URINE MICROALBUMIN  Discontinued  . COLONOSCOPY (Pts 45-494yrInsurance coverage will  need to be confirmed)  Discontinued      08/10/2021   11:10 PM 08/11/2021    8:00 AM 08/11/2021    7:30 PM 08/12/2021    8:00 AM 10/13/2021    2:32 PM  Fa[employer] the past year?     0  Patient Fall Risk Level High fall risk High fall risk High fall risk High fall risk    Functional Status Survey:    Vitals:  11/01/21 1346  BP: 133/82  Pulse: 78  Resp: 18  Temp: (!) 97.3 F (36.3 C)  SpO2: 91%  Weight: 151 lb 3.2 oz (68.6 kg)   Body mass index is 23.68 kg/m. Physical Exam Vitals and nursing note reviewed.  Constitutional:      General: He is not in acute distress.    Appearance: He is not diaphoretic.  HENT:     Head: Normocephalic and atraumatic.  Neck:     Thyroid: No thyromegaly.     Vascular: No JVD.     Trachea: No tracheal deviation.  Cardiovascular:     Rate and Rhythm: Normal rate and regular rhythm.     Heart sounds: No murmur heard. Pulmonary:     Effort: Accessory muscle usage present.     Breath sounds: Normal breath sounds. No wheezing.     Comments: Decreased bases  Abdominal:     General: Bowel sounds are normal. There is no distension.     Palpations: Abdomen is soft.     Tenderness: There is no abdominal tenderness.  Musculoskeletal:     Cervical back: Normal range of motion and neck supple.  Lymphadenopathy:     Cervical: No cervical adenopathy.  Skin:    General: Skin is warm and dry.  Neurological:     Mental Status: He is alert.     Comments: Left sided weakness.  Oriented to self Able to f/c intermittently   Labs reviewed: Recent Labs    12/18/20 1630 12/19/20 0610 12/19/20 1632 12/20/20 0153 02/08/21 0000 02/09/21 0000 08/10/21 5188 08/10/21 0957 08/11/21 0408 08/12/21 0107 08/16/21 0000 10/14/21 1459 10/18/21 0000 10/25/21 0000  NA  --  139  --    < > 150*   < > 141   < > 140 142   < > 141 139 143  K  --  3.5  --    < > 3.7   < > 3.8   < > 3.6 3.9   < > 4.3 4.3 3.9  CL  --  108  --    < > 107   < > 111  --  109  113*   < > 106 105 105  CO2  --  23  --    < > 22   < > 16*  --  24 23   < > 25* 23* 22  GLUCOSE  --  186*  --    < >  --    < > 148*  --  120* 143*  --   --   --   --   BUN  --  16  --    < > 23*   < > 22  --  17 17   < > '18 20 21  '$ CREATININE  --  0.76  --    < > 0.9   < > 1.18  --  0.81 0.83   < > 0.7 0.8 0.8  CALCIUM  --  7.7*  --    < > 9.9   < > 8.8*  --  8.6* 8.7*   < > 9.3 9.5 9.2  MG 1.9 2.0 1.9   < > 2.1  --   --   --  1.9 2.0  --   --   --   --   PHOS 3.1 2.7 2.4*  --   --   --   --   --   --   --   --   --   --   --    < > =  values in this interval not displayed.   Recent Labs    07/12/21 1056 08/10/21 0937 08/11/21 0408 09/07/21 0000 09/08/21 0000  AST '22 29 27 24 24  '$ ALT '22 26 23 18 18  '$ ALKPHOS  --  93 80 105 105  BILITOT 0.7 0.5 0.8  --   --   PROT 7.2 6.4* 5.8*  --   --   ALBUMIN  --  3.3* 3.1* 3.5 3.5   Recent Labs    07/12/21 1056 08/10/21 0937 08/10/21 0957 08/11/21 0408 08/12/21 0107 09/07/21 0000 09/27/21 0000  WBC 9.0 16.1*  --  9.1 10.2 12.6 10.1  NEUTROABS 6,804 14.3*  --   --  7.8*  --   --   HGB 10.9* 10.0*   < > 9.4* 9.4* 10.2* 10.5*  HCT 33.9* 31.6*   < > 28.7* 29.8* 31* 32*  MCV 90.4 96.3  --  92.0 94.3  --   --   PLT 426* 238  --  218 227 289 348   < > = values in this interval not displayed.   Lab Results  Component Value Date   TSH 1.773 01/24/2021   Lab Results  Component Value Date   HGBA1C 5.7 07/05/2021   Lab Results  Component Value Date   CHOL 110 07/05/2021   HDL 43 07/05/2021   LDLCALC 57 07/05/2021   TRIG 51 07/05/2021   CHOLHDL 2.8 08/08/2020    Significant Diagnostic Results in last 30 days:  No results found.  Assessment/Plan 1. Acute on chronic diastolic CHF (congestive heart failure) (HCC) Has weight gain, sob, and findings on xray to indicate CHF Give lasix 20 mg today and 10 mg tomorrow Need to avoid aggressive diuresis due to hx of hypernatremia Call weight to me on Wednesday Continue daily  weights Heart healthy diet.   2. Hypoxia Due to #1  Titrate for sat >90%   3. Anemia of chronic disease Hgb 9.1 drop from 10.5 at last check Stools are heme neg now, were pos but improved with Prilosec.  Recheck in two weeks.       Family/ staff Communication: discussed with Herbert Pun  Labs/tests ordered:  CBC and BMP pending.

## 2021-11-02 DIAGNOSIS — R293 Abnormal posture: Secondary | ICD-10-CM | POA: Diagnosis not present

## 2021-11-02 DIAGNOSIS — I69354 Hemiplegia and hemiparesis following cerebral infarction affecting left non-dominant side: Secondary | ICD-10-CM | POA: Diagnosis not present

## 2021-11-02 DIAGNOSIS — I69812 Visuospatial deficit and spatial neglect following other cerebrovascular disease: Secondary | ICD-10-CM | POA: Diagnosis not present

## 2021-11-02 DIAGNOSIS — S065X3S Traumatic subdural hemorrhage with loss of consciousness of 1 hour to 5 hours 59 minutes, sequela: Secondary | ICD-10-CM | POA: Diagnosis not present

## 2021-11-02 DIAGNOSIS — M6389 Disorders of muscle in diseases classified elsewhere, multiple sites: Secondary | ICD-10-CM | POA: Diagnosis not present

## 2021-11-02 DIAGNOSIS — I69391 Dysphagia following cerebral infarction: Secondary | ICD-10-CM | POA: Diagnosis not present

## 2021-11-02 DIAGNOSIS — R41840 Attention and concentration deficit: Secondary | ICD-10-CM | POA: Diagnosis not present

## 2021-11-02 DIAGNOSIS — H538 Other visual disturbances: Secondary | ICD-10-CM | POA: Diagnosis not present

## 2021-11-02 DIAGNOSIS — R41841 Cognitive communication deficit: Secondary | ICD-10-CM | POA: Diagnosis not present

## 2021-11-02 DIAGNOSIS — I5022 Chronic systolic (congestive) heart failure: Secondary | ICD-10-CM | POA: Diagnosis not present

## 2021-11-02 DIAGNOSIS — G4089 Other seizures: Secondary | ICD-10-CM | POA: Diagnosis not present

## 2021-11-02 DIAGNOSIS — R278 Other lack of coordination: Secondary | ICD-10-CM | POA: Diagnosis not present

## 2021-11-03 DIAGNOSIS — S065X3S Traumatic subdural hemorrhage with loss of consciousness of 1 hour to 5 hours 59 minutes, sequela: Secondary | ICD-10-CM | POA: Diagnosis not present

## 2021-11-03 DIAGNOSIS — I69812 Visuospatial deficit and spatial neglect following other cerebrovascular disease: Secondary | ICD-10-CM | POA: Diagnosis not present

## 2021-11-03 DIAGNOSIS — I69354 Hemiplegia and hemiparesis following cerebral infarction affecting left non-dominant side: Secondary | ICD-10-CM | POA: Diagnosis not present

## 2021-11-03 DIAGNOSIS — G4089 Other seizures: Secondary | ICD-10-CM | POA: Diagnosis not present

## 2021-11-03 DIAGNOSIS — H538 Other visual disturbances: Secondary | ICD-10-CM | POA: Diagnosis not present

## 2021-11-03 DIAGNOSIS — R41841 Cognitive communication deficit: Secondary | ICD-10-CM | POA: Diagnosis not present

## 2021-11-03 DIAGNOSIS — I5022 Chronic systolic (congestive) heart failure: Secondary | ICD-10-CM | POA: Diagnosis not present

## 2021-11-03 DIAGNOSIS — R293 Abnormal posture: Secondary | ICD-10-CM | POA: Diagnosis not present

## 2021-11-03 DIAGNOSIS — R41840 Attention and concentration deficit: Secondary | ICD-10-CM | POA: Diagnosis not present

## 2021-11-03 DIAGNOSIS — I69391 Dysphagia following cerebral infarction: Secondary | ICD-10-CM | POA: Diagnosis not present

## 2021-11-03 DIAGNOSIS — M6389 Disorders of muscle in diseases classified elsewhere, multiple sites: Secondary | ICD-10-CM | POA: Diagnosis not present

## 2021-11-03 DIAGNOSIS — R278 Other lack of coordination: Secondary | ICD-10-CM | POA: Diagnosis not present

## 2021-11-04 ENCOUNTER — Encounter: Payer: Self-pay | Admitting: Adult Health

## 2021-11-04 ENCOUNTER — Non-Acute Institutional Stay (SKILLED_NURSING_FACILITY): Payer: Medicare Other | Admitting: Adult Health

## 2021-11-04 DIAGNOSIS — M6389 Disorders of muscle in diseases classified elsewhere, multiple sites: Secondary | ICD-10-CM | POA: Diagnosis not present

## 2021-11-04 DIAGNOSIS — R293 Abnormal posture: Secondary | ICD-10-CM | POA: Diagnosis not present

## 2021-11-04 DIAGNOSIS — R41840 Attention and concentration deficit: Secondary | ICD-10-CM | POA: Diagnosis not present

## 2021-11-04 DIAGNOSIS — G4089 Other seizures: Secondary | ICD-10-CM | POA: Diagnosis not present

## 2021-11-04 DIAGNOSIS — I69812 Visuospatial deficit and spatial neglect following other cerebrovascular disease: Secondary | ICD-10-CM | POA: Diagnosis not present

## 2021-11-04 DIAGNOSIS — I5022 Chronic systolic (congestive) heart failure: Secondary | ICD-10-CM | POA: Diagnosis not present

## 2021-11-04 DIAGNOSIS — I5033 Acute on chronic diastolic (congestive) heart failure: Secondary | ICD-10-CM | POA: Diagnosis not present

## 2021-11-04 DIAGNOSIS — I69354 Hemiplegia and hemiparesis following cerebral infarction affecting left non-dominant side: Secondary | ICD-10-CM | POA: Diagnosis not present

## 2021-11-04 DIAGNOSIS — R0902 Hypoxemia: Secondary | ICD-10-CM

## 2021-11-04 DIAGNOSIS — S065X3S Traumatic subdural hemorrhage with loss of consciousness of 1 hour to 5 hours 59 minutes, sequela: Secondary | ICD-10-CM | POA: Diagnosis not present

## 2021-11-04 DIAGNOSIS — I4819 Other persistent atrial fibrillation: Secondary | ICD-10-CM | POA: Diagnosis not present

## 2021-11-04 DIAGNOSIS — R278 Other lack of coordination: Secondary | ICD-10-CM | POA: Diagnosis not present

## 2021-11-04 DIAGNOSIS — I69391 Dysphagia following cerebral infarction: Secondary | ICD-10-CM | POA: Diagnosis not present

## 2021-11-04 DIAGNOSIS — H538 Other visual disturbances: Secondary | ICD-10-CM | POA: Diagnosis not present

## 2021-11-04 DIAGNOSIS — R41841 Cognitive communication deficit: Secondary | ICD-10-CM | POA: Diagnosis not present

## 2021-11-04 LAB — COMPREHENSIVE METABOLIC PANEL
Calcium: 9.4 (ref 8.7–10.7)
eGFR: >90

## 2021-11-04 LAB — BASIC METABOLIC PANEL
BUN: 22 — AB (ref 4–21)
CO2: 20 (ref 13–22)
Chloride: 104 (ref 99–108)
Creatinine: 0.8 (ref 0.6–1.3)
Glucose: 86
Potassium: 4.4 mEq/L (ref 3.5–5.1)
Sodium: 140 (ref 137–147)

## 2021-11-04 NOTE — Progress Notes (Addendum)
Location:  Occupational psychologist of Service:  SNF (31) Provider:   Cindi Carbon, [place] [phone]   [name], MD  Patient Care Team: [name], MD as PCP - General (Internal Medicine) Larey Dresser, MD as PCP - Cardiology (Cardiology) [name], MD as Consulting Physician (Ophthalmology) [name], MD (Internal Medicine)  Extended Emergency Contact Information Primary Emergency Contact: [name] Address: [address]          [place], [place] [address] [place] Phone: [phone] Mobile Phone: [phone] Relation: Spouse Secondary Emergency Contact: [name], [place] [address] [name] [place] Phone: [phone] Relation: Son  Code Status:  DNR Goals of care: Advanced Directive information    09/13/2021   10:08 AM  Advanced Directives  Does Patient Have a Medical Advance Directive? Yes  Type of Paramedic of [place];Living will;Out of facility DNR (pink MOST or yellow form)  Does patient want to make changes to medical advance directive? No - Patient declined  Copy of [place] in Chart? Yes - validated most recent copy scanned in chart (See row information)     Chief Complaint  Patient presents with  . Acute Visit    F/u CHF    HPI:  Pt is a 81 y.o. male seen today for an acute visit for f/u CHF.  PMH includes syncope with fall in May of 2022, TBI with SDH with craniotomy, acute right MCA s/p thrombectomy, bioprosthetic mitral and aortic valve replacement, DM II, diastiolic CHF, BPH, and afib.   Resident reported not to be feeling well on 8/24 with some cough and congestion. Then he had some wheezing and sob with sats in the high 80s on 8/26, oxygen applied. Rapid covid was done and was negative. On 8/27 labs and CXR were ordered for sob, pale color, and weight gain.  CXR showed patchy  bilateral interstitial and alveolar opacities c/w pulmonary venous congestion. Superimposed pneumonitis could not be excluded. He received lasix 20 mg on 8/27, 8/28. 10 mg on 8/29.  The weight did not reduce and he continued to require oxygen along with increased work of breathing and so 40 mg of Lasix was given on 8/30. His weight is down to 148 from 151 ,3 lbs. He is breathing easier. He is eating and drinking well per wife. We have to watch is lasix dosing closely due to hx of hypernatremia. Was on 3 liters at 100%.  Now on 2 liters at 94-97%.    BNP 118 , WBC 9.3 Hgb 9.1 MCV 85.9 plt 228 BMP NA 138 K 4.1 BUN 23 glucose 103, Cr 0.81 Ca 8.7  On life long amoxicillin for endocarditis.   Echo 03/31/21 EF 45-50%    Past Medical History:  Diagnosis Date  . Acute rheumatic heart disease, unspecified    childhood, age  89 & 89  . Acute rheumatic pericarditis   . Atrial fibrillation ([place])    history  . CHF (congestive heart failure) ([place])   . Diverticulosis   . Dysrhythmia   . 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 External hemorrhoids without mention of complication   . H/O aortic valve replacement   . H/O mitral valve replacement   . 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 Lesion of ulnar nerve   . 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 Other and unspecified hyperlipidemia   . Pre-diabetes   . Previous back surgery 1978, jan 2007  . Psychosexual dysfunction with inhibited sexual excitement   . SOB (shortness of breath)    "with heavy exercise"  . Stroke ([place]) 08/2013   "I WAS IN AFIB AND THREW A CLOT, THE EFFECTS WERE TRANSITORY AND DIDNT LAST BUT FOR 30 MINUTES"   . Thoracic aortic aneurysm [facility])    Past Surgical History:  Procedure Laterality Date  . AORTIC AND MITRAL VALVE REPLACEMENT     09/2004  . CARDIOVERSION     3 times from 2004-2006  . CARDIOVERSION N/A 09/26/2013   Procedure: CARDIOVERSION;  Surgeon: Larey Dresser, MD;  Location: [place];  Service:  Cardiovascular;  Laterality: N/A;  . CARDIOVERSION N/A 06/19/2014   Procedure: CARDIOVERSION;  Surgeon: Jerline Pain, MD;  Location: [place];  Service: Cardiovascular;  Laterality: N/A;  . CARDIOVERSION N/A 04/24/2017   Procedure: CARDIOVERSION;  Surgeon: Larey Dresser, MD;  Location[facility] ENDOSCOPY;  Service: Cardiovascular;  Laterality: N/A;  . CARDIOVERSION N/A 06/08/2017   Procedure: CARDIOVERSION;  Surgeon: Pixie Casino, MD;  Location: [facility] ENDOSCOPY;  Service: Cardiovascular;  Laterality: N/A;  . CARDIOVERSION N/A 08/24/2017   Procedure: CARDIOVERSION;  Surgeon: Skeet Latch, MD;  Location: [facility] ENDOSCOPY;  Service: Cardiovascular;  Laterality: N/A;  . CARDIOVERSION N/A 02/14/2018   Procedure: CARDIOVERSION;  Surgeon: Larey Dresser, MD;  Location: [facility] ENDOSCOPY;  Service: Cardiovascular;  Laterality: N/A;  . COLONOSCOPY    . CRANIOTOMY N/A 07/28/2020   Procedure: CRANIOTOMY FOR EVACUATION OF SUBDURAL HEMATOMA;  Surgeon: Eustace Pense, MD;  Location: [place];  Service: Neurosurgery;  Laterality: N/A;  . IR ANGIO VERTEBRAL SEL SUBCLAVIAN INNOMINATE UNI R MOD SED  08/07/2020  . IR CT HEAD LTD  08/07/2020  . IR PERCUTANEOUS ART THROMBECTOMY/INFUSION INTRACRANIAL INC DIAG ANGIO  08/07/2020  . laminectomies     10/1975 and in 03/2005  . RADIOLOGY WITH ANESTHESIA N/A 08/07/2020   Procedure: IR WITH ANESTHESIA;  Surgeon: Luanne Bras, MD;  Location: [place];  Service: Radiology;  Laterality: N/A;  . TEE WITHOUT CARDIOVERSION N/A 12/18/2020   Procedure: TRANSESOPHAGEAL ECHOCARDIOGRAM (TEE);  Surgeon: Larey Dresser, MD;  Location: [facility] ENDOSCOPY;  Service: Cardiovascular;  Laterality: N/A;  . TONSILLECTOMY     1950  . TOTAL HIP ARTHROPLASTY Right 04/03/2018   Procedure: TOTAL HIP ARTHROPLASTY ANTERIOR APPROACH;  Surgeon: Paralee Cancel, MD;  Location: [facility];  Service: Orthopedics;  Laterality: Right;  70 mins    Allergies  Allergen Reactions  . Naproxen Hives  . No [employer]ht[employer] Food  Allergies Other (See Comments)    Scallops - distress, nausea and vomitting    Outpatient Encounter Medications as of 11/04/2021  Medication Sig  . acetaminophen (TYLENOL) 325 MG tablet Take 650 mg by mouth as needed for mild pain.  . Amantadine HCl 100 MG tablet Take 100 mg by mouth 2 (two) times daily.  [name] amLODipine (NORVASC) 5 MG tablet Take 5 mg by mouth daily.  [name] amoxicillin (AMOXIL) 500 MG capsule Take 500 mg by mouth 2 (two) times daily.  *NO STOP DATE* - per pharmacy can open capsule -Do not crush  . apixaban (ELIQUIS) 5 MG TABS tablet Take 1 tablet (5 mg total) by mouth 2 (two) times daily.  . Artificial Saliva (BIOTENE MOISTURIZING MOUTH MT) Use as directed 2 sprays in the mouth or throat in the morning, at noon, and at  bedtime.  [name] atorvastatin (LIPITOR) 80 MG tablet Take 1 tablet (80 mg total) by mouth daily.  . bisacodyl (DULCOLAX) 10 MG suppository Place 1 suppository (10 mg total) rectally daily as needed for moderate constipation or mild constipation.  . clobetasol (TEMOVATE) 0.05 % external solution Apply 1 application  topically as needed (itching).  . diazePAM, 15 MG Dose, (VALTOCO 15 MG DOSE) 2 x 7.5 MG/0.1ML LQPK Place 7.5 mg into each nostril (total of 15 mg) for grand mal seizure lasting over 5 minutes. Can repeat once in 4 hours. Do Not use more than twice in 24 hours  . dofetilide (TIKOSYN) 125 MCG capsule Take 3 capsules (375 mcg total) by mouth 2 (two) times daily.  . famotidine (PEPCID) 20 MG tablet Take 20 mg by mouth at bedtime as needed for indigestion.  . finasteride (PROSCAR) 5 MG tablet Take 1 tablet (5 mg total) by mouth daily.  . Glucerna (GLUCERNA) LIQD Take 237 mLs by mouth daily.  . hydrocortisone 2.5 % cream Apply 1 application. topically daily as needed (itching).  [name] lacosamide (VIMPAT) 50 MG TABS tablet Take 1 tablet (50 mg total) by mouth 2 (two) times daily.  [name] levalbuterol (XOPENEX) 0.63 MG/3ML nebulizer solution Take 0.63 mg by nebulization every 6  (six) hours as needed for wheezing or shortness of breath.  . magnesium gluconate (MAGONATE) 500 MG tablet Take 1 tablet (500 mg total) by mouth daily.  . methenamine (MANDELAMINE) 1 g tablet Take 1,000 mg by mouth 2 (two) times daily.  . methylphenidate (RITALIN) 5 MG tablet Take 5 mg by mouth 2 (two) times daily.  . Multiple Vitamin (MULTIVITAMIN WITH MINERALS) TABS tablet Take 1 tablet by mouth daily.  [name] omeprazole (PRILOSEC) 20 MG capsule Take 20 mg by mouth daily.  Vladimir Faster Glycol-Propyl Glycol (SYSTANE) 0.4-0.3 % SOLN Place 2 drops into both eyes daily.  . polyethylene glycol (MIRALAX / GLYCOLAX) 17 g packet Take 17 g by mouth every other day.  . senna-docusate (SENOKOT-S) 8.6-50 MG tablet Take 1 tablet by mouth at bedtime as needed.   No facility-administered encounter medications on file as of 11/04/2021.    Review of Systems  Unable to perform ROS: Dementia    Immunization History  Administered Date(s) Administered  . Fluad Quad(high Dose 65+) 12/06/2018, 12/09/2020  . Influenza Whole 12/25/2008, 01/05/2010, 12/30/2011  . Influenza, High Dose Seasonal PF 12/14/2016  . Influenza,inj,Quad PF,6+ Mos 12/19/2013  . Influenza,inj,quad, With Preservative 11/22/2017  . Influenza-Unspecified 12/14/2015, 12/24/2019  . Moderna Covid-19 Vaccine Bivalent Booster 35yr & up 01/12/2021  . Moderna SARS-COV2 Booster Vaccination 01/02/2020, 06/26/2020  . Moderna Sars-Covid-2 Vaccination 04/08/2019, 05/05/2019  . Pneumococcal Conjugate-13 12/19/2013  . Pneumococcal Polysaccharide-23 09/24/2007, 05/22/2017  . Td 12/25/2008  . Tdap 03/22/2018  . Zoster Recombinat (Shingrix) 07/16/2019, 10/28/2019  . Zoster, Live 09/24/2007   Pertinent  Health Maintenance Due  Topic Date Due  . INFLUENZA VACCINE  10/05/2021  . OPHTHALMOLOGY EXAM  10/06/2022 (Originally 10/04/1950)  . HEMOGLOBIN A1C  01/05/2022  . FOOT EXAM  10/23/2022  . URINE MICROALBUMIN  Discontinued  . COLONOSCOPY (Pts 45-472yr Insurance coverage will need to be confirmed)  Discontinued      08/10/2021   11:10 PM 08/11/2021    8:00 AM 08/11/2021    7:30 PM 08/12/2021    8:00 AM 10/13/2021    2:32 PM  [place] the past year?     0  Patient Fall Risk Level High fall risk High fall  risk High fall risk High fall risk    Functional Status Survey:    Vitals:   11/04/21 1523  BP: 122/78  Pulse: 74  Resp: (!) 22  Temp: (!) 97 F (36.1 C)  SpO2: 100%  Weight: 148 lb 12.8 oz (67.5 kg)   Body mass index is 23.31 kg/m. Physical Exam Vitals and nursing note reviewed.  Constitutional:      General: He is not in acute distress.    Appearance: He is not diaphoretic.  HENT:     Head: Normocephalic and atraumatic.  Neck:     Thyroid: No thyromegaly.     Vascular: No JVD.     Trachea: No tracheal deviation.  Cardiovascular:     Rate and Rhythm: Normal rate and regular rhythm.     Heart sounds: No murmur heard. Pulmonary:     Effort: Pulmonary effort is normal. No respiratory distress.     Breath sounds: Normal breath sounds. No wheezing or rales.  Abdominal:     General: Bowel sounds are normal. There is no distension.     Palpations: Abdomen is soft.     Tenderness: There is no abdominal tenderness.  Musculoskeletal:     Comments: Very trace edema to ankles  Lymphadenopathy:     Cervical: No cervical adenopathy.  Skin:    General: Skin is warm and dry.  Neurological:     Mental Status: He is alert. Mental status is at baseline.     Comments: Left sided weakness   Labs reviewed: Recent Labs    12/18/20 1630 12/19/20 0610 12/19/20 1632 12/20/20 0153 02/08/21 0000 02/09/21 0000 08/10/21 2725 08/10/21 0957 08/11/21 0408 08/12/21 0107 08/16/21 0000 10/25/21 0000 10/31/21 0000 11/01/21 0000  NA  --  139  --    < > 150*   < > 141   < > 140 142   < > 143 138 140  K  --  3.5  --    < > 3.7   < > 3.8   < > 3.6 3.9   < > 3.9 4.1 4.5  CL  --  108  --    < > 107   < > 111  --  109 113*   < >  105 106 105  CO2  --  23  --    < > 22   < > 16*  --  24 23   < > 22 25* 26*  GLUCOSE  --  186*  --    < >  --    < > 148*  --  120* 143*  --   --   --   --   BUN  --  16  --    < > 23*   < > 22  --  17 17   < > 21 23* 20  CREATININE  --  0.76  --    < > 0.9   < > 1.18  --  0.81 0.83   < > 0.8 0.8 0.9  CALCIUM  --  7.7*  --    < > 9.9   < > 8.8*  --  8.6* 8.7*   < > 9.2 8.7 9.1  MG 1.9 2.0 1.9   < > 2.1  --   --   --  1.9 2.0  --   --   --   --   PHOS 3.1 2.7 2.4*  --   --   --   --   --   --   --   --   --   --   --    < > =  values in this interval not displayed.   Recent Labs    07/12/21 1056 08/10/21 0937 08/11/21 0408 09/07/21 0000 09/08/21 0000  AST '22 29 27 24 24  '$ ALT '22 26 23 18 18  '$ ALKPHOS  --  93 80 105 105  BILITOT 0.7 0.5 0.8  --   --   PROT 7.2 6.4* 5.8*  --   --   ALBUMIN  --  3.3* 3.1* 3.5 3.5   Recent Labs    07/12/21 1056 08/10/21 0937 08/10/21 0957 08/11/21 0408 08/12/21 0107 09/07/21 0000 10/28/21 0000 10/31/21 0000 11/01/21 0000  WBC 9.0 16.1*  --  9.1 10.2   < > 9.9 9.3 8.9  NEUTROABS 6,804 14.3*  --   --  7.8*  --   --   --   --   HGB 10.9* 10.0*   < > 9.4* 9.4*   < > 10.0* 9.1* 9.4*  HCT 33.9* 31.6*   < > 28.7* 29.8*   < > 32* 27* 28*  MCV 90.4 96.3  --  92.0 94.3  --   --   --   --   PLT 426* 238  --  218 227   < > 241 228 258   < > = values in this interval not displayed.   Lab Results  Component Value Date   TSH 1.773 01/24/2021   Lab Results  Component Value Date   HGBA1C 5.7 07/05/2021   Lab Results  Component Value Date   CHOL 110 07/05/2021   HDL 43 07/05/2021   LDLCALC 57 07/05/2021   TRIG 51 07/05/2021   CHOLHDL 2.8 08/08/2020    Significant Diagnostic Results in last 30 days:  No results found.  Assessment/Plan 1. Acute on chronic diastolic CHF (congestive heart failure) (HCC) Improved with 3 lb loss. Hold on lasix today  Continue to monitor weight daily.  Will follow weight and assess for more lasix in am. Avoiding  aggressive diuresis due to hx of hypernatremia.   2. Hypoxia Due to #1  Improving Titrate for sat >90%       Family/ staff Communication: discussed with Herbert Pun  Labs/tests ordered:  BMP pending.

## 2021-11-05 ENCOUNTER — Telehealth: Payer: Self-pay | Admitting: Adult Health

## 2021-11-05 DIAGNOSIS — H538 Other visual disturbances: Secondary | ICD-10-CM | POA: Diagnosis not present

## 2021-11-05 DIAGNOSIS — R41841 Cognitive communication deficit: Secondary | ICD-10-CM | POA: Diagnosis not present

## 2021-11-05 DIAGNOSIS — R278 Other lack of coordination: Secondary | ICD-10-CM | POA: Diagnosis not present

## 2021-11-05 DIAGNOSIS — I69354 Hemiplegia and hemiparesis following cerebral infarction affecting left non-dominant side: Secondary | ICD-10-CM | POA: Diagnosis not present

## 2021-11-05 DIAGNOSIS — I69391 Dysphagia following cerebral infarction: Secondary | ICD-10-CM | POA: Diagnosis not present

## 2021-11-05 DIAGNOSIS — G4089 Other seizures: Secondary | ICD-10-CM | POA: Diagnosis not present

## 2021-11-05 DIAGNOSIS — I5022 Chronic systolic (congestive) heart failure: Secondary | ICD-10-CM | POA: Diagnosis not present

## 2021-11-05 DIAGNOSIS — M6389 Disorders of muscle in diseases classified elsewhere, multiple sites: Secondary | ICD-10-CM | POA: Diagnosis not present

## 2021-11-05 DIAGNOSIS — S065X3S Traumatic subdural hemorrhage with loss of consciousness of 1 hour to 5 hours 59 minutes, sequela: Secondary | ICD-10-CM | POA: Diagnosis not present

## 2021-11-05 DIAGNOSIS — R293 Abnormal posture: Secondary | ICD-10-CM | POA: Diagnosis not present

## 2021-11-05 DIAGNOSIS — R41840 Attention and concentration deficit: Secondary | ICD-10-CM | POA: Diagnosis not present

## 2021-11-05 DIAGNOSIS — I69812 Visuospatial deficit and spatial neglect following other cerebrovascular disease: Secondary | ICD-10-CM | POA: Diagnosis not present

## 2021-11-05 NOTE — Telephone Encounter (Signed)
Resident is alert and eating and drinking. Oxygen reduced to 2 liters tolerating well. Weight did increase by 2 lbs to 150 lbs. Give lasix 20 mg and Kdur 10 meq daily for 2 days. Goal to lose at least 3 lbs and continue to titrate oxygen. Reviewed BMP from 11/04/21. NA 140.

## 2021-11-08 DIAGNOSIS — I38 Endocarditis, valve unspecified: Secondary | ICD-10-CM | POA: Diagnosis not present

## 2021-11-08 DIAGNOSIS — R41841 Cognitive communication deficit: Secondary | ICD-10-CM | POA: Diagnosis not present

## 2021-11-08 DIAGNOSIS — I5022 Chronic systolic (congestive) heart failure: Secondary | ICD-10-CM | POA: Diagnosis not present

## 2021-11-08 DIAGNOSIS — I69391 Dysphagia following cerebral infarction: Secondary | ICD-10-CM | POA: Diagnosis not present

## 2021-11-08 DIAGNOSIS — I69354 Hemiplegia and hemiparesis following cerebral infarction affecting left non-dominant side: Secondary | ICD-10-CM | POA: Diagnosis not present

## 2021-11-08 DIAGNOSIS — G4089 Other seizures: Secondary | ICD-10-CM | POA: Diagnosis not present

## 2021-11-08 LAB — CBC: RBC: 3.42 — AB (ref 3.87–5.11)

## 2021-11-08 LAB — CBC AND DIFFERENTIAL
HCT: 30 — AB (ref 41–53)
Hemoglobin: 9.9 — AB (ref 13.5–17.5)
Platelets: 295 10*3/uL (ref 150–400)
WBC: 7.9

## 2021-11-08 LAB — BASIC METABOLIC PANEL
BUN: 22 — AB (ref 4–21)
CO2: 24 — AB (ref 13–22)
Chloride: 104 (ref 99–108)
Creatinine: 0.8 (ref 0.6–1.3)
Glucose: 88
Potassium: 3.8 mEq/L (ref 3.5–5.1)
Sodium: 140 (ref 137–147)

## 2021-11-08 LAB — COMPREHENSIVE METABOLIC PANEL
Calcium: 9.4 (ref 8.7–10.7)
eGFR: 89

## 2021-11-09 ENCOUNTER — Telehealth: Payer: Self-pay | Admitting: Internal Medicine

## 2021-11-09 ENCOUNTER — Telehealth: Payer: Self-pay | Admitting: *Deleted

## 2021-11-09 ENCOUNTER — Other Ambulatory Visit: Payer: Self-pay | Admitting: Internal Medicine

## 2021-11-09 DIAGNOSIS — I5022 Chronic systolic (congestive) heart failure: Secondary | ICD-10-CM | POA: Diagnosis not present

## 2021-11-09 DIAGNOSIS — G4089 Other seizures: Secondary | ICD-10-CM | POA: Diagnosis not present

## 2021-11-09 DIAGNOSIS — R41841 Cognitive communication deficit: Secondary | ICD-10-CM | POA: Diagnosis not present

## 2021-11-09 DIAGNOSIS — I69391 Dysphagia following cerebral infarction: Secondary | ICD-10-CM | POA: Diagnosis not present

## 2021-11-09 DIAGNOSIS — I69812 Visuospatial deficit and spatial neglect following other cerebrovascular disease: Secondary | ICD-10-CM | POA: Diagnosis not present

## 2021-11-09 DIAGNOSIS — R41840 Attention and concentration deficit: Secondary | ICD-10-CM | POA: Diagnosis not present

## 2021-11-09 DIAGNOSIS — S065X3S Traumatic subdural hemorrhage with loss of consciousness of 1 hour to 5 hours 59 minutes, sequela: Secondary | ICD-10-CM | POA: Diagnosis not present

## 2021-11-09 DIAGNOSIS — H538 Other visual disturbances: Secondary | ICD-10-CM | POA: Diagnosis not present

## 2021-11-09 DIAGNOSIS — I69354 Hemiplegia and hemiparesis following cerebral infarction affecting left non-dominant side: Secondary | ICD-10-CM | POA: Diagnosis not present

## 2021-11-09 DIAGNOSIS — R278 Other lack of coordination: Secondary | ICD-10-CM | POA: Diagnosis not present

## 2021-11-09 DIAGNOSIS — R293 Abnormal posture: Secondary | ICD-10-CM | POA: Diagnosis not present

## 2021-11-09 DIAGNOSIS — M6389 Disorders of muscle in diseases classified elsewhere, multiple sites: Secondary | ICD-10-CM | POA: Diagnosis not present

## 2021-11-09 MED ORDER — FUROSEMIDE 20 MG PO TABS: 20.0000 mg | ORAL_TABLET | Freq: Every day | ORAL | 0 refills | Status: DC

## 2021-11-09 MED ORDER — METHYLPHENIDATE HCL 5 MG PO TABS
5.0000 mg | ORAL_TABLET | Freq: Two times a day (BID) | ORAL | 0 refills | Status: DC
Start: 2021-11-09 — End: 2021-11-23

## 2021-11-09 NOTE — Telephone Encounter (Signed)
Patient continues to need Lasix 20 mg QD instead of PRN Will add that to his list He gets BMP every week also so will follow

## 2021-11-09 NOTE — Telephone Encounter (Signed)
Rx written and sent to the pharmacy. Thanks!  

## 2021-11-09 NOTE — Telephone Encounter (Signed)
[facility] sent fax requesting methylphenidate 5 mg for [name] [name] and send it to [facility]. They report he has 13 pills left.

## 2021-11-09 NOTE — Progress Notes (Signed)
Lasix QD

## 2021-11-10 ENCOUNTER — Ambulatory Visit: Payer: Medicare Other | Admitting: Adult Health

## 2021-11-10 DIAGNOSIS — I5022 Chronic systolic (congestive) heart failure: Secondary | ICD-10-CM | POA: Diagnosis not present

## 2021-11-10 DIAGNOSIS — H538 Other visual disturbances: Secondary | ICD-10-CM | POA: Diagnosis not present

## 2021-11-10 DIAGNOSIS — S065X3S Traumatic subdural hemorrhage with loss of consciousness of 1 hour to 5 hours 59 minutes, sequela: Secondary | ICD-10-CM | POA: Diagnosis not present

## 2021-11-10 DIAGNOSIS — R41841 Cognitive communication deficit: Secondary | ICD-10-CM | POA: Diagnosis not present

## 2021-11-10 DIAGNOSIS — G4089 Other seizures: Secondary | ICD-10-CM | POA: Diagnosis not present

## 2021-11-10 DIAGNOSIS — R293 Abnormal posture: Secondary | ICD-10-CM | POA: Diagnosis not present

## 2021-11-10 DIAGNOSIS — R278 Other lack of coordination: Secondary | ICD-10-CM | POA: Diagnosis not present

## 2021-11-10 DIAGNOSIS — I69391 Dysphagia following cerebral infarction: Secondary | ICD-10-CM | POA: Diagnosis not present

## 2021-11-10 DIAGNOSIS — I69812 Visuospatial deficit and spatial neglect following other cerebrovascular disease: Secondary | ICD-10-CM | POA: Diagnosis not present

## 2021-11-10 DIAGNOSIS — R41840 Attention and concentration deficit: Secondary | ICD-10-CM | POA: Diagnosis not present

## 2021-11-10 DIAGNOSIS — I69354 Hemiplegia and hemiparesis following cerebral infarction affecting left non-dominant side: Secondary | ICD-10-CM | POA: Diagnosis not present

## 2021-11-10 DIAGNOSIS — M6389 Disorders of muscle in diseases classified elsewhere, multiple sites: Secondary | ICD-10-CM | POA: Diagnosis not present

## 2021-11-11 ENCOUNTER — Non-Acute Institutional Stay (SKILLED_NURSING_FACILITY): Payer: Medicare Other | Admitting: Adult Health

## 2021-11-11 ENCOUNTER — Encounter: Payer: Self-pay | Admitting: Adult Health

## 2021-11-11 DIAGNOSIS — H538 Other visual disturbances: Secondary | ICD-10-CM | POA: Diagnosis not present

## 2021-11-11 DIAGNOSIS — R0902 Hypoxemia: Secondary | ICD-10-CM

## 2021-11-11 DIAGNOSIS — G4089 Other seizures: Secondary | ICD-10-CM | POA: Diagnosis not present

## 2021-11-11 DIAGNOSIS — I5033 Acute on chronic diastolic (congestive) heart failure: Secondary | ICD-10-CM

## 2021-11-11 DIAGNOSIS — R293 Abnormal posture: Secondary | ICD-10-CM | POA: Diagnosis not present

## 2021-11-11 DIAGNOSIS — M6389 Disorders of muscle in diseases classified elsewhere, multiple sites: Secondary | ICD-10-CM | POA: Diagnosis not present

## 2021-11-11 DIAGNOSIS — R41840 Attention and concentration deficit: Secondary | ICD-10-CM | POA: Diagnosis not present

## 2021-11-11 DIAGNOSIS — I69812 Visuospatial deficit and spatial neglect following other cerebrovascular disease: Secondary | ICD-10-CM | POA: Diagnosis not present

## 2021-11-11 DIAGNOSIS — R278 Other lack of coordination: Secondary | ICD-10-CM | POA: Diagnosis not present

## 2021-11-11 DIAGNOSIS — I5022 Chronic systolic (congestive) heart failure: Secondary | ICD-10-CM | POA: Diagnosis not present

## 2021-11-11 DIAGNOSIS — I69354 Hemiplegia and hemiparesis following cerebral infarction affecting left non-dominant side: Secondary | ICD-10-CM | POA: Diagnosis not present

## 2021-11-11 DIAGNOSIS — I69391 Dysphagia following cerebral infarction: Secondary | ICD-10-CM | POA: Diagnosis not present

## 2021-11-11 DIAGNOSIS — R41841 Cognitive communication deficit: Secondary | ICD-10-CM | POA: Diagnosis not present

## 2021-11-11 DIAGNOSIS — S065X3S Traumatic subdural hemorrhage with loss of consciousness of 1 hour to 5 hours 59 minutes, sequela: Secondary | ICD-10-CM | POA: Diagnosis not present

## 2021-11-11 NOTE — Progress Notes (Signed)
Location:  Occupational psychologist of Service:  SNF (31) Provider:   Cindi Carbon, [place] [phone]   [name], MD  Patient Care Team: [name], MD as PCP - General (Internal Medicine) Larey Dresser, MD as PCP - Cardiology (Cardiology) [name], MD as Consulting Physician (Ophthalmology) [name], MD (Internal Medicine)  Extended Emergency Contact Information Primary Emergency Contact: [name] Address: [address]          [place], [place] [address] [place] Phone: [phone] Mobile Phone: [phone] Relation: Spouse Secondary Emergency Contact: [name], [place] [address] [name] of [place] Phone: [phone] Relation: Son  Code Status:  DNR Goals of care: Advanced Directive information    09/13/2021   10:08 AM  Advanced Directives  Does Patient Have a Medical Advance Directive? Yes  Type of Paramedic of [place];Living will;Out of facility DNR (pink MOST or yellow form)  Does patient want to make changes to medical advance directive? No - Patient declined  Copy of [place] in Chart? Yes - validated most recent copy scanned in chart (See row information)     Chief Complaint  Patient presents with   Acute Visit    F/u hypoxia and weight gain    HPI:  Pt is a 81 y.o. male seen today for an acute visit for f/u CHF.  PMH includes syncope with fall in May of 2022, TBI with SDH with craniotomy, acute right MCA s/p thrombectomy, bioprosthetic mitral and aortic valve replacement, DM II, diastiolic CHF, BPH, and afib.  On life long amoxicillin for endocarditis.   Resident reported not to be feeling well on 8/24 with some cough and congestion. Then he had some wheezing and sob with sats in the high 80s on 8/26, oxygen applied. Rapid covid was done and was negative. On 8/27 labs and CXR were  ordered for sob, pale color, and weight gain.  CXR showed patchy bilateral interstitial and alveolar opacities c/w pulmonary venous congestion. Superimposed pneumonitis could not be excluded. He has been getting Lasix 20 mg daily. We are monitoring his BMP weekly due to hx of hypernatremia. His weight is down 3 lbs. The nurse has been titrating his oxygen over the past week. He is 99% on 1.5 liters. He had an episode of low oxygen earlier in the week 88-89% but was eating. Also the nurse is concerned about the accuracy of the pulse ox and is using a different one and matching the pulse manual count with the pulse ox which seems to be more accurate. He is sitting up in the bed with OT at this time. They have not noticed any cough or sob.  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   External hemorrhoids without mention of complication    H/O aortic valve replacement  H/O mitral valve replacement    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   Lesion of ulnar nerve    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    Other and unspecified hyperlipidemia    Pre-diabetes    Previous back surgery 1978, jan 2007   Psychosexual dysfunction with inhibited sexual excitement    SOB (shortness of breath)    "with heavy exercise"   Stroke ([facility]) 08/2013   "I WAS IN AFIB AND THREW A CLOT, THE EFFECTS WERE TRANSITORY AND DIDNT LAST BUT FOR 30 MINUTES"    Thoracic aortic aneurysm [facility])    Past Surgical History:  Procedure Laterality Date   AORTIC AND MITRAL VALVE REPLACEMENT      09/2004   CARDIOVERSION     3 times from 2004-2006   CARDIOVERSION N/A 09/26/2013   Procedure: CARDIOVERSION;  Surgeon: Larey Dresser, MD;  Location: [facility];  Service: Cardiovascular;  Laterality: N/A;   CARDIOVERSION N/A 06/19/2014   Procedure: CARDIOVERSION;  Surgeon: Jerline Pain, MD;  Location: [facility];  Service: Cardiovascular;  Laterality: N/A;   CARDIOVERSION N/A 04/24/2017   Procedure: CARDIOVERSION;  Surgeon: Larey Dresser, MD;  Location: [facility];  Service: Cardiovascular;  Laterality: N/A;   CARDIOVERSION N/A 06/08/2017   Procedure: CARDIOVERSION;  Surgeon: Pixie Casino, MD;  Location: [facility] ENDOSCOPY;  Service: Cardiovascular;  Laterality: N/A;   CARDIOVERSION N/A 08/24/2017   Procedure: CARDIOVERSION;  Surgeon: Skeet Latch, MD;  Location[facility] ENDOSCOPY;  Service: Cardiovascular;  Laterality: N/A;   CARDIOVERSION N/A 02/14/2018   Procedure: CARDIOVERSION;  Surgeon: Larey Dresser, MD;  Location: [facility] ENDOSCOPY;  Service: Cardiovascular;  Laterality: N/A;   COLONOSCOPY     CRANIOTOMY N/A 07/28/2020   Procedure: CRANIOTOMY FOR EVACUATION OF SUBDURAL HEMATOMA;  Surgeon: Eustace Klimas, MD;  Location: [facility];  Service: Neurosurgery;  Laterality: N/A;   IR ANGIO VERTEBRAL SEL SUBCLAVIAN INNOMINATE UNI R MOD SED  08/07/2020   IR CT HEAD LTD  08/07/2020   IR PERCUTANEOUS ART THROMBECTOMY/INFUSION INTRACRANIAL INC DIAG ANGIO  08/07/2020   laminectomies     10/1975 and in 03/2005   RADIOLOGY WITH ANESTHESIA N/A 08/07/2020   Procedure: IR WITH ANESTHESIA;  Surgeon: Luanne Bras, MD;  Location: [facility];  Service: Radiology;  Laterality: N/A;   TEE WITHOUT CARDIOVERSION N/A 12/18/2020   Procedure: TRANSESOPHAGEAL ECHOCARDIOGRAM (TEE);  Surgeon: Larey Dresser, MD;  Location: [facility] ENDOSCOPY;  Service: Cardiovascular;  Laterality: N/A;   Page Right 04/03/2018   Procedure: TOTAL HIP ARTHROPLASTY ANTERIOR APPROACH;  Surgeon: Paralee Cancel, MD;  Location: [facility];  Service: Orthopedics;  Laterality: Right;  70 mins    Allergies  Allergen Reactions   Naproxen Hives   No [facility] Food Allergies Other (See Comments)    Scallops - distress, nausea and vomitting    Outpatient Encounter Medications as of 11/11/2021  Medication Sig   potassium chloride (KLOR-CON) 10 MEQ tablet Take 10 mEq by mouth daily.   acetaminophen (TYLENOL) 325 MG tablet Take 650 mg by mouth as needed for mild pain.   Amantadine HCl 100 MG tablet Take 100 mg by mouth 2 (two) times daily.   amLODipine (NORVASC) 5 MG tablet Take 5 mg by mouth daily.   amoxicillin (AMOXIL) 500 MG capsule Take 500 mg by mouth 2 (two) times daily.  *NO STOP DATE* - per pharmacy can open capsule -Do not crush  apixaban (ELIQUIS) 5 MG TABS tablet Take 1 tablet (5 mg total) by mouth 2 (two) times daily.   Artificial Saliva (BIOTENE MOISTURIZING MOUTH MT) Use as directed 2 sprays in the mouth or throat in the morning, at noon, and at bedtime.   atorvastatin (LIPITOR) 80 MG tablet Take 1 tablet (80 mg total) by mouth daily.   bisacodyl (DULCOLAX) 10 MG suppository Place 1 suppository (10 mg total) rectally daily as needed for moderate constipation or mild constipation.   clobetasol (TEMOVATE) 0.05 % external solution Apply 1 application  topically as needed (itching).   diazePAM, 15 MG Dose, (VALTOCO 15 MG DOSE) 2 x 7.5 MG/0.1ML LQPK Place 7.5 mg into each nostril (total of 15 mg) for grand mal seizure lasting over 5 minutes. Can repeat once in 4 hours. Do Not use more than twice in 24 hours   dofetilide (TIKOSYN) 125 MCG capsule Take 3 capsules (375 mcg total) by mouth 2 (two) times daily.   famotidine (PEPCID) 20 MG tablet Take 20 mg by mouth at bedtime as needed for indigestion.   finasteride (PROSCAR) 5 MG tablet Take 1 tablet (5 mg total) by mouth daily.   furosemide (LASIX) 20 MG tablet Take 1 tablet (20 mg total) by mouth daily.   Glucerna (GLUCERNA) LIQD Take 237 mLs  by mouth daily.   hydrocortisone 2.5 % cream Apply 1 application. topically daily as needed (itching).   lacosamide (VIMPAT) 50 MG TABS tablet Take 1 tablet (50 mg total) by mouth 2 (two) times daily.   levalbuterol (XOPENEX) 0.63 MG/3ML nebulizer solution Take 0.63 mg by nebulization every 6 (six) hours as needed for wheezing or shortness of breath.   magnesium gluconate (MAGONATE) 500 MG tablet Take 1 tablet (500 mg total) by mouth daily.   methenamine (MANDELAMINE) 1 g tablet Take 1,000 mg by mouth 2 (two) times daily.   methylphenidate (RITALIN) 5 MG tablet Take 1 tablet (5 mg total) by mouth 2 (two) times daily.   Multiple Vitamin (MULTIVITAMIN WITH MINERALS) TABS tablet Take 1 tablet by mouth daily.   omeprazole (PRILOSEC) 20 MG capsule Take 20 mg by mouth daily.   Polyethyl Glycol-Propyl Glycol (SYSTANE) 0.4-0.3 % SOLN Place 2 drops into both eyes daily.   polyethylene glycol (MIRALAX / GLYCOLAX) 17 g packet Take 17 g by mouth every other day.   senna-docusate (SENOKOT-S) 8.6-50 MG tablet Take 1 tablet by mouth at bedtime as needed.   No facility-administered encounter medications on file as of 11/11/2021.    Review of Systems  Unable to perform ROS: Other    Immunization History  Administered Date(s) Administered   Fluad Quad(high Dose 65+) 12/06/2018, 12/09/2020   Influenza Whole 12/25/2008, 01/05/2010, 12/30/2011   Influenza, High Dose Seasonal PF 12/14/2016   Influenza,inj,Quad PF,6+ Mos 12/19/2013   Influenza,inj,quad, With Preservative 11/22/2017   Influenza-Unspecified 12/14/2015, 12/24/2019   Moderna Covid-19 Vaccine Bivalent Booster 64yr & up 01/12/2021   Moderna SARS-COV2 Booster Vaccination 01/02/2020, 06/26/2020   Moderna Sars-Covid-2 Vaccination 04/08/2019, 05/05/2019   Pneumococcal Conjugate-13 12/19/2013   Pneumococcal Polysaccharide-23 09/24/2007, 05/22/2017   Td 12/25/2008   Tdap 03/22/2018   Zoster Recombinat (Shingrix) 07/16/2019, 10/28/2019   Zoster, Live  09/24/2007   Pertinent  Health Maintenance Due  Topic Date Due   INFLUENZA VACCINE  10/05/2021   OPHTHALMOLOGY EXAM  10/06/2022 (Originally 10/04/1950)   HEMOGLOBIN A1C  01/05/2022   FOOT EXAM  10/23/2022   URINE MICROALBUMIN  Discontinued   COLONOSCOPY (Pts 45-438yrInsurance coverage will need to  be confirmed)  Discontinued      08/10/2021   11:10 PM 08/11/2021    8:00 AM 08/11/2021    7:30 PM 08/12/2021    8:00 AM 10/13/2021    2:32 PM  Fall Risk  Falls in the past year?     0  Patient Fall Risk Level High fall risk High fall risk High fall risk High fall risk    Functional Status Survey:    Vitals:   11/11/21 1640  BP: 126/78  Pulse: 78  Resp: 20  Temp: 98 F (36.7 C)  SpO2: 99%  Weight: 148 lb 9.6 oz (67.4 kg)   Body mass index is 23.27 kg/m. Physical Exam Vitals and nursing note reviewed.  Constitutional:      General: He is not in acute distress.    Appearance: He is not diaphoretic.  HENT:     Head: Normocephalic and atraumatic.  Neck:     Thyroid: No thyromegaly.     Vascular: No JVD.     Trachea: No tracheal deviation.  Cardiovascular:     Rate and Rhythm: Normal rate and regular rhythm.     Heart sounds: No murmur heard. Pulmonary:     Effort: Pulmonary effort is normal. No respiratory distress.     Breath sounds: Rales present. No wheezing.  Abdominal:     General: Bowel sounds are normal. There is no distension.     Palpations: Abdomen is soft.     Tenderness: There is no abdominal tenderness.  Musculoskeletal:     Right lower leg: No edema.     Left lower leg: No edema.     Comments: Very trace edema to ankles  Lymphadenopathy:     Cervical: No cervical adenopathy.  Skin:    General: Skin is warm and dry.  Neurological:     Mental Status: He is alert.     Comments: Left sided weakness    Labs reviewed: Recent Labs    12/18/20 1630 12/19/20 0610 12/19/20 1632 12/20/20 0153 02/08/21 0000 02/09/21 0000 08/10/21 2595 08/10/21 0957  08/11/21 0408 08/12/21 0107 08/16/21 0000 10/25/21 0000 10/31/21 0000 11/01/21 0000  NA  --  139  --    < > 150*   < > 141   < > 140 142   < > 143 138 140  K  --  3.5  --    < > 3.7   < > 3.8   < > 3.6 3.9   < > 3.9 4.1 4.5  CL  --  108  --    < > 107   < > 111  --  109 113*   < > 105 106 105  CO2  --  23  --    < > 22   < > 16*  --  24 23   < > 22 25* 26*  GLUCOSE  --  186*  --    < >  --    < > 148*  --  120* 143*  --   --   --   --   BUN  --  16  --    < > 23*   < > 22  --  17 17   < > 21 23* 20  CREATININE  --  0.76  --    < > 0.9   < > 1.18  --  0.81 0.83   < > 0.8 0.8 0.9  CALCIUM  --  7.7*  --    < >  9.9   < > 8.8*  --  8.6* 8.7*   < > 9.2 8.7 9.1  MG 1.9 2.0 1.9   < > 2.1  --   --   --  1.9 2.0  --   --   --   --   PHOS 3.1 2.7 2.4*  --   --   --   --   --   --   --   --   --   --   --    < > = values in this interval not displayed.    Recent Labs    07/12/21 1056 08/10/21 0937 08/11/21 0408 09/07/21 0000 09/08/21 0000  AST '22 29 27 24 24  '$ ALT '22 26 23 18 18  '$ ALKPHOS  --  93 80 105 105  BILITOT 0.7 0.5 0.8  --   --   PROT 7.2 6.4* 5.8*  --   --   ALBUMIN  --  3.3* 3.1* 3.5 3.5    Recent Labs    07/12/21 1056 08/10/21 0937 08/10/21 0957 08/11/21 0408 08/12/21 0107 09/07/21 0000 10/28/21 0000 10/31/21 0000 11/01/21 0000  WBC 9.0 16.1*  --  9.1 10.2   < > 9.9 9.3 8.9  NEUTROABS 6,804 14.3*  --   --  7.8*  --   --   --   --   HGB 10.9* 10.0*   < > 9.4* 9.4*   < > 10.0* 9.1* 9.4*  HCT 33.9* 31.6*   < > 28.7* 29.8*   < > 32* 27* 28*  MCV 90.4 96.3  --  92.0 94.3  --   --   --   --   PLT 426* 238  --  218 227   < > 241 228 258   < > = values in this interval not displayed.    Lab Results  Component Value Date   TSH 1.773 01/24/2021   Lab Results  Component Value Date   HGBA1C 5.7 07/05/2021   Lab Results  Component Value Date   CHOL 110 07/05/2021   HDL 43 07/05/2021   LDLCALC 57 07/05/2021   TRIG 51 07/05/2021   CHOLHDL 2.8 08/08/2020     Significant Diagnostic Results in last 30 days:  No results found.  Assessment/Plan 1. Acute on chronic diastolic CHF (congestive heart failure) (HCC) Improving Continue lasix 20 mg daily May increase if next BMP does not show high sodium and he continues to require oxygen  2. Hypoxia Due to #1  Improving Titrate for sat >90% Recommend checking with two pulse ox machines and matching manual pulse count with pulse ox pulse count reading.        Family/ staff Communication: discussed with Glenna Fellows ordered:  BMP qmonday

## 2021-11-12 DIAGNOSIS — R41841 Cognitive communication deficit: Secondary | ICD-10-CM | POA: Diagnosis not present

## 2021-11-12 DIAGNOSIS — G4089 Other seizures: Secondary | ICD-10-CM | POA: Diagnosis not present

## 2021-11-12 DIAGNOSIS — I69391 Dysphagia following cerebral infarction: Secondary | ICD-10-CM | POA: Diagnosis not present

## 2021-11-12 DIAGNOSIS — S065X3S Traumatic subdural hemorrhage with loss of consciousness of 1 hour to 5 hours 59 minutes, sequela: Secondary | ICD-10-CM | POA: Diagnosis not present

## 2021-11-12 DIAGNOSIS — M6389 Disorders of muscle in diseases classified elsewhere, multiple sites: Secondary | ICD-10-CM | POA: Diagnosis not present

## 2021-11-12 DIAGNOSIS — R278 Other lack of coordination: Secondary | ICD-10-CM | POA: Diagnosis not present

## 2021-11-12 DIAGNOSIS — I69812 Visuospatial deficit and spatial neglect following other cerebrovascular disease: Secondary | ICD-10-CM | POA: Diagnosis not present

## 2021-11-12 DIAGNOSIS — I5022 Chronic systolic (congestive) heart failure: Secondary | ICD-10-CM | POA: Diagnosis not present

## 2021-11-12 DIAGNOSIS — R293 Abnormal posture: Secondary | ICD-10-CM | POA: Diagnosis not present

## 2021-11-12 DIAGNOSIS — R41840 Attention and concentration deficit: Secondary | ICD-10-CM | POA: Diagnosis not present

## 2021-11-12 DIAGNOSIS — H538 Other visual disturbances: Secondary | ICD-10-CM | POA: Diagnosis not present

## 2021-11-12 DIAGNOSIS — I69354 Hemiplegia and hemiparesis following cerebral infarction affecting left non-dominant side: Secondary | ICD-10-CM | POA: Diagnosis not present

## 2021-11-15 ENCOUNTER — Non-Acute Institutional Stay (SKILLED_NURSING_FACILITY): Payer: Medicare Other | Admitting: Adult Health

## 2021-11-15 ENCOUNTER — Encounter: Payer: Self-pay | Admitting: Adult Health

## 2021-11-15 DIAGNOSIS — I69812 Visuospatial deficit and spatial neglect following other cerebrovascular disease: Secondary | ICD-10-CM | POA: Diagnosis not present

## 2021-11-15 DIAGNOSIS — R569 Unspecified convulsions: Secondary | ICD-10-CM

## 2021-11-15 DIAGNOSIS — S065X3S Traumatic subdural hemorrhage with loss of consciousness of 1 hour to 5 hours 59 minutes, sequela: Secondary | ICD-10-CM | POA: Diagnosis not present

## 2021-11-15 DIAGNOSIS — R41840 Attention and concentration deficit: Secondary | ICD-10-CM | POA: Diagnosis not present

## 2021-11-15 DIAGNOSIS — I38 Endocarditis, valve unspecified: Secondary | ICD-10-CM | POA: Diagnosis not present

## 2021-11-15 DIAGNOSIS — H538 Other visual disturbances: Secondary | ICD-10-CM | POA: Diagnosis not present

## 2021-11-15 DIAGNOSIS — R41841 Cognitive communication deficit: Secondary | ICD-10-CM | POA: Diagnosis not present

## 2021-11-15 DIAGNOSIS — I5022 Chronic systolic (congestive) heart failure: Secondary | ICD-10-CM | POA: Diagnosis not present

## 2021-11-15 DIAGNOSIS — G4089 Other seizures: Secondary | ICD-10-CM | POA: Diagnosis not present

## 2021-11-15 DIAGNOSIS — I69354 Hemiplegia and hemiparesis following cerebral infarction affecting left non-dominant side: Secondary | ICD-10-CM | POA: Diagnosis not present

## 2021-11-15 DIAGNOSIS — I69391 Dysphagia following cerebral infarction: Secondary | ICD-10-CM | POA: Diagnosis not present

## 2021-11-15 DIAGNOSIS — I5033 Acute on chronic diastolic (congestive) heart failure: Secondary | ICD-10-CM | POA: Diagnosis not present

## 2021-11-15 DIAGNOSIS — R278 Other lack of coordination: Secondary | ICD-10-CM | POA: Diagnosis not present

## 2021-11-15 DIAGNOSIS — R293 Abnormal posture: Secondary | ICD-10-CM | POA: Diagnosis not present

## 2021-11-15 DIAGNOSIS — M6389 Disorders of muscle in diseases classified elsewhere, multiple sites: Secondary | ICD-10-CM | POA: Diagnosis not present

## 2021-11-15 LAB — COMPREHENSIVE METABOLIC PANEL
Calcium: 9.6 (ref 8.7–10.7)
eGFR: >90

## 2021-11-15 LAB — BASIC METABOLIC PANEL
BUN: 18 (ref 4–21)
CO2: 25 — AB (ref 13–22)
Chloride: 102 (ref 99–108)
Creatinine: 0.7 (ref 0.6–1.3)
Glucose: 98
Potassium: 4.1 mEq/L (ref 3.5–5.1)
Sodium: 137 (ref 137–147)

## 2021-11-15 NOTE — Progress Notes (Signed)
Location:  Occupational psychologist of Service:  SNF (31) Provider:   Cindi Carbon, [place] [phone]   [name], MD  Patient Care Team: [name], MD as PCP - General (Internal Medicine) Larey Dresser, MD as PCP - Cardiology (Cardiology) [name], MD as Consulting Physician (Ophthalmology) [name], MD (Internal Medicine)  Extended Emergency Contact Information Primary Emergency Contact: [name] Address: [address]          [place], [place] [address] [place] Phone: [phone] Mobile Phone: [phone] Relation: Spouse Secondary Emergency Contact: [name], [place] [address] [name] Phone: [phone] Relation: Son  Code Status:  DNR Goals of care: Advanced Directive information    09/13/2021   10:08 AM  Advanced Directives  Does Patient Have a Medical Advance Directive? Yes  Type of Paramedic of [place];Living will;Out of facility DNR (pink MOST or yellow form)  Does patient want to make changes to medical advance directive? No - Patient declined  Copy of [place] in Chart? Yes - validated most recent copy scanned in chart (See row information)     Chief Complaint  Patient presents with   Acute Visit    seizure    HPI:  Pt is a 81 y.o. male seen today for an acute visit for seizure.   PMH includes syncope with fall in May of 2022, TBI with SDH with craniotomy, acute right MCA s/p thrombectomy, bioprosthetic mitral and aortic valve replacement, DM II, diastiolic CHF, BPH, and afib.  On life long amoxicillin for endocarditis  His nurse reports on 9/8 he had a seizure. His body was shaking and his eyes rolled and looked to the left. This lasted 3-4  min. The order for diazepam states to give if symptoms last 71mn or greater and so it was not given. He has not had a seizure  in three months. Prior to this it was one year. He is on vimpat. He is also on ritalin for  wakefulness which seems to be really be helping. During the seizure oxygen was applied but now is off and he is 95% on RA.   He is tolerating lasix 20 mg daily well now. Weight is down to 147.6 lbs No sob. Not on oxygen. BMP is checked weekly due to hyponatremia.  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    Other and unspecified hyperlipidemia    Pre-diabetes    Previous back surgery 1978, jan 2007   Psychosexual dysfunction with inhibited sexual excitement    SOB (shortness of breath)    "with heavy exercise"   [place] 08[place]   "I WAS IN AFIB AND THREW A CLOT, THE EFFECTS WERE TRANSITORY AND DIDNT  LAST BUT FOR 30 MINUTES"    Thoracic aortic aneurysm [facility])    Past Surgical History:  Procedure Laterality Date   AORTIC AND MITRAL VALVE REPLACEMENT     09/2004   CARDIOVERSION     3 times from 2004-2006   CARDIOVERSION N/A 09/26/2013   Procedure: CARDIOVERSION;  Surgeon: Larey Dresser, MD;  Location: [facility];  Service: Cardiovascular;  Laterality: N/A;   CARDIOVERSION N/A 06/19/2014   Procedure: CARDIOVERSION;  Surgeon: Jerline Pain, MD;  Location: [place];  Service: Cardiovascular;  Laterality: N/A;   CARDIOVERSION N/A 04/24/2017   Procedure: CARDIOVERSION;  Surgeon: Larey Dresser, MD;  Location: [place];  Service: Cardiovascular;  Laterality: N/A;   CARDIOVERSION  N/A 06/08/2017   Procedure: CARDIOVERSION;  Surgeon: Pixie Casino, MD;  Location: [facility] ENDOSCOPY;  Service: Cardiovascular;  Laterality: N/A;   CARDIOVERSION N/A 08/24/2017   Procedure: CARDIOVERSION;  Surgeon: Skeet Latch, MD;  Location: [facility] ENDOSCOPY;  Service: Cardiovascular;  Laterality: N/A;   CARDIOVERSION N/A 02/14/2018   Procedure: CARDIOVERSION;  Surgeon: Larey Dresser, MD;  Location: [facility] ENDOSCOPY;  Service: Cardiovascular;  Laterality: N/A;   COLONOSCOPY     CRANIOTOMY N/A 07/28/2020   Procedure: CRANIOTOMY FOR EVACUATION OF SUBDURAL HEMATOMA;  Surgeon: Eustace Moradi, MD;  Location: [place];  Service: Neurosurgery;  Laterality: N/A;   IR ANGIO VERTEBRAL SEL SUBCLAVIAN INNOMINATE UNI R MOD SED  08/07/2020   IR CT HEAD LTD  08/07/2020   IR PERCUTANEOUS ART THROMBECTOMY/INFUSION INTRACRANIAL INC DIAG ANGIO  08/07/2020   laminectomies     10/1975 and in 03/2005   RADIOLOGY WITH ANESTHESIA N/A 08/07/2020   Procedure: IR WITH ANESTHESIA;  Surgeon: Luanne Bras, MD;  Location: [place];  Service: Radiology;  Laterality: N/A;   TEE WITHOUT CARDIOVERSION N/A 12/18/2020   Procedure: TRANSESOPHAGEAL ECHOCARDIOGRAM (TEE);  Surgeon: Larey Dresser, MD;  Location: [facility] ENDOSCOPY;  Service: Cardiovascular;  Laterality: N/A;   [place] Right 04/03/2018   Procedure: TOTAL HIP ARTHROPLASTY ANTERIOR APPROACH;  Surgeon: Paralee Cancel, MD;  Location: [facility];  Service: Orthopedics;  Laterality: Right;  70 mins    Allergies  Allergen Reactions   Naproxen Hives   No [facility] Food Allergies Other (See Comments)    Scallops - distress, nausea and vomitting    Outpatient Encounter Medications as of 11/15/2021  Medication Sig   acetaminophen (TYLENOL) 325 MG tablet Take 650 mg by mouth as needed for mild pain.   Amantadine HCl 100 MG tablet Take 100 mg by mouth 2 (two) times daily.   amLODipine (NORVASC) 5 MG tablet Take 5 mg by mouth daily.   amoxicillin  (AMOXIL) 500 MG capsule Take 500 mg by mouth 2 (two) times daily.  *NO STOP DATE* - per pharmacy can open capsule -Do not crush   apixaban (ELIQUIS) 5 MG TABS tablet Take 1 tablet (5 mg total) by mouth 2 (two) times daily.   Artificial Saliva (BIOTENE MOISTURIZING MOUTH MT) Use as directed 2 sprays in the mouth or throat in the morning, at noon, and at bedtime.   atorvastatin (LIPITOR) 80 MG tablet Take 1 tablet (80 mg total) by mouth daily.   bisacodyl (DULCOLAX) 10 MG suppository Place 1 suppository (10 mg total) rectally daily as needed for moderate constipation or mild constipation.   clobetasol (TEMOVATE) 0.05 % external solution Apply 1 application  topically as needed (itching).   diazePAM, 15 MG Dose, (VALTOCO 15 MG  DOSE) 2 x 7.5 MG/0.1ML LQPK Place 7.5 mg into each nostril (total of 15 mg) for grand mal seizure lasting over 5 minutes. Can repeat once in 4 hours. Do Not use more than twice in 24 hours   dofetilide (TIKOSYN) 125 MCG capsule Take 3 capsules (375 mcg total) by mouth 2 (two) times daily.   famotidine (PEPCID) 20 MG tablet Take 20 mg by mouth at bedtime as needed for indigestion.   finasteride (PROSCAR) 5 MG tablet Take 1 tablet (5 mg total) by mouth daily.   furosemide (LASIX) 20 MG tablet Take 1 tablet (20 mg total) by mouth daily.   Glucerna (GLUCERNA) LIQD Take 237 mLs by mouth daily.   hydrocortisone 2.5 % cream Apply 1 application. topically daily as needed (itching).   lacosamide (VIMPAT) 50 MG TABS tablet Take 1 tablet (50 mg total) by mouth 2 (two) times daily.   levalbuterol (XOPENEX) 0.63 MG/3ML nebulizer solution Take 0.63 mg by nebulization every 6 (six) hours as needed for wheezing or shortness of breath.   magnesium gluconate (MAGONATE) 500 MG tablet Take 1 tablet (500 mg total) by mouth daily.   methenamine (MANDELAMINE) 1 g tablet Take 1,000 mg by mouth 2 (two) times daily.   methylphenidate (RITALIN) 5 MG tablet Take 1 tablet (5 mg total) by mouth 2 (two) times  daily.   Multiple Vitamin (MULTIVITAMIN WITH MINERALS) TABS tablet Take 1 tablet by mouth daily.   omeprazole (PRILOSEC) 20 MG capsule Take 20 mg by mouth daily.   Polyethyl Glycol-Propyl Glycol (SYSTANE) 0.4-0.3 % SOLN Place 2 drops into both eyes daily.   polyethylene glycol (MIRALAX / GLYCOLAX) 17 g packet Take 17 g by mouth every other day.   potassium chloride (KLOR-CON) 10 MEQ tablet Take 10 mEq by mouth daily.   senna-docusate (SENOKOT-S) 8.6-50 MG tablet Take 1 tablet by mouth at bedtime as needed.   No facility-administered encounter medications on file as of 11/15/2021.    Review of Systems  Unable to perform ROS: Other    Immunization History  Administered Date(s) Administered   Fluad Quad(high Dose 65+) 12/06/2018, 12/09/2020   Influenza Whole 12/25/2008, 01/05/2010, 12/30/2011   Influenza, High Dose Seasonal PF 12/14/2016   Influenza,inj,Quad PF,6+ Mos 12/19/2013   Influenza,inj,quad, With Preservative 11/22/2017   Influenza-Unspecified 12/14/2015, 12/24/2019   Moderna Covid-19 Vaccine Bivalent Booster 68yr & up 01/12/2021   Moderna SARS-COV2 Booster Vaccination 01/02/2020, 06/26/2020   Moderna Sars-Covid-2 Vaccination 04/08/2019, 05/05/2019   Pneumococcal Conjugate-13 12/19/2013   Pneumococcal Polysaccharide-23 09/24/2007, 05/22/2017   Td 12/25/2008   Tdap 03/22/2018   Zoster Recombinat (Shingrix) 07/16/2019, 10/28/2019   Zoster, Live 09/24/2007   Pertinent  Health Maintenance Due  Topic Date Due   INFLUENZA VACCINE  10/05/2021   OPHTHALMOLOGY EXAM  10/06/2022 (Originally 10/04/1950)   HEMOGLOBIN A1C  01/05/2022   FOOT EXAM  10/23/2022   URINE MICROALBUMIN  Discontinued   COLONOSCOPY (Pts 45-454yrInsurance coverage will need to be confirmed)  Discontinued      08/10/2021   11:10 PM 08/11/2021    8:00 AM 08/11/2021    7:30 PM 08/12/2021    8:00 AM 10/13/2021    2:32 PM  Fa[place] Club Hillsn the past year?     0  Patient Fall Risk Level High fall risk High fall  risk High fall risk High fall risk    Functional Status Survey:    Vitals:   11/15/21 1155  BP: 103/65  Pulse: 98  Resp: 18  Temp: (!)  97.5 F (36.4 C)  SpO2: 95%  Weight: 147 lb 9.6 oz (67 kg)   Body mass index is 23.12 kg/m. Physical Exam Vitals and nursing note reviewed.  Constitutional:      General: He is not in acute distress.    Appearance: He is not diaphoretic.  HENT:     Head: Normocephalic and atraumatic.  Neck:     Thyroid: No thyromegaly.     Vascular: No JVD.     Trachea: No tracheal deviation.  Cardiovascular:     Rate and Rhythm: Normal rate and regular rhythm.     Heart sounds: No murmur heard. Pulmonary:     Effort: Pulmonary effort is normal. No respiratory distress.     Breath sounds: Rales present. No wheezing.  Abdominal:     General: Bowel sounds are normal. There is no distension.     Palpations: Abdomen is soft.     Tenderness: There is no abdominal tenderness.  Musculoskeletal:     Right lower leg: No edema.     Left lower leg: No edema.  Lymphadenopathy:     Cervical: No cervical adenopathy.  Skin:    General: Skin is warm and dry.  Neurological:     Mental Status: He is alert.     Comments: Alert intermittently able to f/c.  Has left sided weakness     Labs reviewed: Recent Labs    12/18/20 1630 12/19/20 0610 12/19/20 1632 12/20/20 0153 02/08/21 0000 02/09/21 0000 08/10/21 7371 08/10/21 0957 08/11/21 0408 08/12/21 0107 08/16/21 0000 11/01/21 0000 11/04/21 0000 11/08/21 0000  NA  --  139  --    < > 150*   < > 141   < > 140 142   < > 140 140 140  K  --  3.5  --    < > 3.7   < > 3.8   < > 3.6 3.9   < > 4.5 4.4 3.8  CL  --  108  --    < > 107   < > 111  --  109 113*   < > 105 104 104  CO2  --  23  --    < > 22   < > 16*  --  24 23   < > 26* 20 24*  GLUCOSE  --  186*  --    < >  --    < > 148*  --  120* 143*  --   --   --   --   BUN  --  16  --    < > 23*   < > 22  --  17 17   < > 20 22* 22*  CREATININE  --  0.76  --     < > 0.9   < > 1.18  --  0.81 0.83   < > 0.9 0.8 0.8  CALCIUM  --  7.7*  --    < > 9.9   < > 8.8*  --  8.6* 8.7*   < > 9.1 9.4 9.4  MG 1.9 2.0 1.9   < > 2.1  --   --   --  1.9 2.0  --   --   --   --   PHOS 3.1 2.7 2.4*  --   --   --   --   --   --   --   --   --   --   --    < > =  values in this interval not displayed.   Recent Labs    07/12/21 1056 08/10/21 0937 08/11/21 0408 09/07/21 0000 09/08/21 0000  AST '22 29 27 24 24  '$ ALT '22 26 23 18 18  '$ ALKPHOS  --  93 80 105 105  BILITOT 0.7 0.5 0.8  --   --   PROT 7.2 6.4* 5.8*  --   --   ALBUMIN  --  3.3* 3.1* 3.5 3.5   Recent Labs    07/12/21 1056 08/10/21 0937 08/10/21 0957 08/11/21 0408 08/12/21 0107 09/07/21 0000 10/31/21 0000 11/01/21 0000 11/08/21 0000  WBC 9.0 16.1*  --  9.1 10.2   < > 9.3 8.9 7.9  NEUTROABS 6,804 14.3*  --   --  7.8*  --   --   --   --   HGB 10.9* 10.0*   < > 9.4* 9.4*   < > 9.1* 9.4* 9.9*  HCT 33.9* 31.6*   < > 28.7* 29.8*   < > 27* 28* 30*  MCV 90.4 96.3  --  92.0 94.3  --   --   --   --   PLT 426* 238  --  218 227   < > 228 258 295   < > = values in this interval not displayed.   Lab Results  Component Value Date   TSH 1.773 01/24/2021   Lab Results  Component Value Date   HGBA1C 5.7 07/05/2021   Lab Results  Component Value Date   CHOL 110 07/05/2021   HDL 43 07/05/2021   LDLCALC 57 07/05/2021   TRIG 51 07/05/2021   CHOLHDL 2.8 08/08/2020    Significant Diagnostic Results in last 30 days:  No results found.  Assessment/Plan  1. Seizure ([place]) Hx of CVA and TBI on vimpat Resolved after 3-4 min Pt is alert now and doing well Discussed with wife that we can let neurology know for further recommendations. She says he has an apt.   2. Acute on chronic diastolic CHF (congestive heart failure) ([place]) Improved Doing well on lasix 20 mg daily Continue daily weights Weekly BMPs    Family/ staff Communication: discussed with Herbert Pun.   Labs/tests ordered:  BMP

## 2021-11-16 DIAGNOSIS — R41841 Cognitive communication deficit: Secondary | ICD-10-CM | POA: Diagnosis not present

## 2021-11-16 DIAGNOSIS — I69391 Dysphagia following cerebral infarction: Secondary | ICD-10-CM | POA: Diagnosis not present

## 2021-11-16 DIAGNOSIS — R293 Abnormal posture: Secondary | ICD-10-CM | POA: Diagnosis not present

## 2021-11-16 DIAGNOSIS — M6389 Disorders of muscle in diseases classified elsewhere, multiple sites: Secondary | ICD-10-CM | POA: Diagnosis not present

## 2021-11-16 DIAGNOSIS — R41840 Attention and concentration deficit: Secondary | ICD-10-CM | POA: Diagnosis not present

## 2021-11-16 DIAGNOSIS — R278 Other lack of coordination: Secondary | ICD-10-CM | POA: Diagnosis not present

## 2021-11-16 DIAGNOSIS — S065X3S Traumatic subdural hemorrhage with loss of consciousness of 1 hour to 5 hours 59 minutes, sequela: Secondary | ICD-10-CM | POA: Diagnosis not present

## 2021-11-16 DIAGNOSIS — I69354 Hemiplegia and hemiparesis following cerebral infarction affecting left non-dominant side: Secondary | ICD-10-CM | POA: Diagnosis not present

## 2021-11-16 DIAGNOSIS — H538 Other visual disturbances: Secondary | ICD-10-CM | POA: Diagnosis not present

## 2021-11-16 DIAGNOSIS — I69812 Visuospatial deficit and spatial neglect following other cerebrovascular disease: Secondary | ICD-10-CM | POA: Diagnosis not present

## 2021-11-16 DIAGNOSIS — G4089 Other seizures: Secondary | ICD-10-CM | POA: Diagnosis not present

## 2021-11-16 DIAGNOSIS — I5022 Chronic systolic (congestive) heart failure: Secondary | ICD-10-CM | POA: Diagnosis not present

## 2021-11-17 ENCOUNTER — Encounter: Payer: Self-pay | Admitting: Internal Medicine

## 2021-11-17 DIAGNOSIS — G4089 Other seizures: Secondary | ICD-10-CM | POA: Diagnosis not present

## 2021-11-17 DIAGNOSIS — R41841 Cognitive communication deficit: Secondary | ICD-10-CM | POA: Diagnosis not present

## 2021-11-17 DIAGNOSIS — I69354 Hemiplegia and hemiparesis following cerebral infarction affecting left non-dominant side: Secondary | ICD-10-CM | POA: Diagnosis not present

## 2021-11-17 DIAGNOSIS — H538 Other visual disturbances: Secondary | ICD-10-CM | POA: Diagnosis not present

## 2021-11-17 DIAGNOSIS — M6389 Disorders of muscle in diseases classified elsewhere, multiple sites: Secondary | ICD-10-CM | POA: Diagnosis not present

## 2021-11-17 DIAGNOSIS — R293 Abnormal posture: Secondary | ICD-10-CM | POA: Diagnosis not present

## 2021-11-17 DIAGNOSIS — S065X3S Traumatic subdural hemorrhage with loss of consciousness of 1 hour to 5 hours 59 minutes, sequela: Secondary | ICD-10-CM | POA: Diagnosis not present

## 2021-11-17 DIAGNOSIS — I69391 Dysphagia following cerebral infarction: Secondary | ICD-10-CM | POA: Diagnosis not present

## 2021-11-17 DIAGNOSIS — I5022 Chronic systolic (congestive) heart failure: Secondary | ICD-10-CM | POA: Diagnosis not present

## 2021-11-17 DIAGNOSIS — R278 Other lack of coordination: Secondary | ICD-10-CM | POA: Diagnosis not present

## 2021-11-17 DIAGNOSIS — R41840 Attention and concentration deficit: Secondary | ICD-10-CM | POA: Diagnosis not present

## 2021-11-17 DIAGNOSIS — I69812 Visuospatial deficit and spatial neglect following other cerebrovascular disease: Secondary | ICD-10-CM | POA: Diagnosis not present

## 2021-11-18 DIAGNOSIS — H538 Other visual disturbances: Secondary | ICD-10-CM | POA: Diagnosis not present

## 2021-11-18 DIAGNOSIS — R293 Abnormal posture: Secondary | ICD-10-CM | POA: Diagnosis not present

## 2021-11-18 DIAGNOSIS — G4089 Other seizures: Secondary | ICD-10-CM | POA: Diagnosis not present

## 2021-11-18 DIAGNOSIS — I69812 Visuospatial deficit and spatial neglect following other cerebrovascular disease: Secondary | ICD-10-CM | POA: Diagnosis not present

## 2021-11-18 DIAGNOSIS — I69391 Dysphagia following cerebral infarction: Secondary | ICD-10-CM | POA: Diagnosis not present

## 2021-11-18 DIAGNOSIS — R278 Other lack of coordination: Secondary | ICD-10-CM | POA: Diagnosis not present

## 2021-11-18 DIAGNOSIS — I69354 Hemiplegia and hemiparesis following cerebral infarction affecting left non-dominant side: Secondary | ICD-10-CM | POA: Diagnosis not present

## 2021-11-18 DIAGNOSIS — R41840 Attention and concentration deficit: Secondary | ICD-10-CM | POA: Diagnosis not present

## 2021-11-18 DIAGNOSIS — R41841 Cognitive communication deficit: Secondary | ICD-10-CM | POA: Diagnosis not present

## 2021-11-18 DIAGNOSIS — M6389 Disorders of muscle in diseases classified elsewhere, multiple sites: Secondary | ICD-10-CM | POA: Diagnosis not present

## 2021-11-18 DIAGNOSIS — S065X3S Traumatic subdural hemorrhage with loss of consciousness of 1 hour to 5 hours 59 minutes, sequela: Secondary | ICD-10-CM | POA: Diagnosis not present

## 2021-11-18 DIAGNOSIS — I5022 Chronic systolic (congestive) heart failure: Secondary | ICD-10-CM | POA: Diagnosis not present

## 2021-11-19 DIAGNOSIS — I69391 Dysphagia following cerebral infarction: Secondary | ICD-10-CM | POA: Diagnosis not present

## 2021-11-19 DIAGNOSIS — S065X3S Traumatic subdural hemorrhage with loss of consciousness of 1 hour to 5 hours 59 minutes, sequela: Secondary | ICD-10-CM | POA: Diagnosis not present

## 2021-11-19 DIAGNOSIS — R278 Other lack of coordination: Secondary | ICD-10-CM | POA: Diagnosis not present

## 2021-11-19 DIAGNOSIS — R41841 Cognitive communication deficit: Secondary | ICD-10-CM | POA: Diagnosis not present

## 2021-11-19 DIAGNOSIS — I69812 Visuospatial deficit and spatial neglect following other cerebrovascular disease: Secondary | ICD-10-CM | POA: Diagnosis not present

## 2021-11-19 DIAGNOSIS — R41840 Attention and concentration deficit: Secondary | ICD-10-CM | POA: Diagnosis not present

## 2021-11-19 DIAGNOSIS — G4089 Other seizures: Secondary | ICD-10-CM | POA: Diagnosis not present

## 2021-11-19 DIAGNOSIS — M6389 Disorders of muscle in diseases classified elsewhere, multiple sites: Secondary | ICD-10-CM | POA: Diagnosis not present

## 2021-11-19 DIAGNOSIS — I5022 Chronic systolic (congestive) heart failure: Secondary | ICD-10-CM | POA: Diagnosis not present

## 2021-11-19 DIAGNOSIS — R293 Abnormal posture: Secondary | ICD-10-CM | POA: Diagnosis not present

## 2021-11-19 DIAGNOSIS — I69354 Hemiplegia and hemiparesis following cerebral infarction affecting left non-dominant side: Secondary | ICD-10-CM | POA: Diagnosis not present

## 2021-11-19 DIAGNOSIS — H538 Other visual disturbances: Secondary | ICD-10-CM | POA: Diagnosis not present

## 2021-11-22 ENCOUNTER — Encounter: Payer: Self-pay | Admitting: Internal Medicine

## 2021-11-22 DIAGNOSIS — S065X3S Traumatic subdural hemorrhage with loss of consciousness of 1 hour to 5 hours 59 minutes, sequela: Secondary | ICD-10-CM | POA: Diagnosis not present

## 2021-11-22 DIAGNOSIS — R293 Abnormal posture: Secondary | ICD-10-CM | POA: Diagnosis not present

## 2021-11-22 DIAGNOSIS — M6389 Disorders of muscle in diseases classified elsewhere, multiple sites: Secondary | ICD-10-CM | POA: Diagnosis not present

## 2021-11-22 DIAGNOSIS — R41840 Attention and concentration deficit: Secondary | ICD-10-CM | POA: Diagnosis not present

## 2021-11-22 DIAGNOSIS — R278 Other lack of coordination: Secondary | ICD-10-CM | POA: Diagnosis not present

## 2021-11-22 DIAGNOSIS — G4089 Other seizures: Secondary | ICD-10-CM | POA: Diagnosis not present

## 2021-11-22 DIAGNOSIS — I69391 Dysphagia following cerebral infarction: Secondary | ICD-10-CM | POA: Diagnosis not present

## 2021-11-22 DIAGNOSIS — R41841 Cognitive communication deficit: Secondary | ICD-10-CM | POA: Diagnosis not present

## 2021-11-22 DIAGNOSIS — H538 Other visual disturbances: Secondary | ICD-10-CM | POA: Diagnosis not present

## 2021-11-22 DIAGNOSIS — I5022 Chronic systolic (congestive) heart failure: Secondary | ICD-10-CM | POA: Diagnosis not present

## 2021-11-22 DIAGNOSIS — I69354 Hemiplegia and hemiparesis following cerebral infarction affecting left non-dominant side: Secondary | ICD-10-CM | POA: Diagnosis not present

## 2021-11-22 DIAGNOSIS — I38 Endocarditis, valve unspecified: Secondary | ICD-10-CM | POA: Diagnosis not present

## 2021-11-22 DIAGNOSIS — I69812 Visuospatial deficit and spatial neglect following other cerebrovascular disease: Secondary | ICD-10-CM | POA: Diagnosis not present

## 2021-11-22 LAB — BASIC METABOLIC PANEL
BUN: 19 (ref 4–21)
CO2: 23 — AB (ref 13–22)
Chloride: 105 (ref 99–108)
Creatinine: 0.8 (ref 0.6–1.3)
Glucose: 98
Potassium: 3.8 mEq/L (ref 3.5–5.1)
Sodium: 140 (ref 137–147)

## 2021-11-22 LAB — COMPREHENSIVE METABOLIC PANEL
Calcium: 9.1 (ref 8.7–10.7)
eGFR: >90

## 2021-11-23 ENCOUNTER — Encounter: Payer: Self-pay | Admitting: Neurology

## 2021-11-23 ENCOUNTER — Ambulatory Visit (INDEPENDENT_AMBULATORY_CARE_PROVIDER_SITE_OTHER): Payer: Medicare Other | Admitting: Neurology

## 2021-11-23 VITALS — BP 116/69 | HR 73

## 2021-11-23 DIAGNOSIS — R278 Other lack of coordination: Secondary | ICD-10-CM | POA: Diagnosis not present

## 2021-11-23 DIAGNOSIS — R41841 Cognitive communication deficit: Secondary | ICD-10-CM | POA: Diagnosis not present

## 2021-11-23 DIAGNOSIS — G4737 Central sleep apnea in conditions classified elsewhere: Secondary | ICD-10-CM

## 2021-11-23 DIAGNOSIS — R293 Abnormal posture: Secondary | ICD-10-CM | POA: Diagnosis not present

## 2021-11-23 DIAGNOSIS — I69812 Visuospatial deficit and spatial neglect following other cerebrovascular disease: Secondary | ICD-10-CM | POA: Diagnosis not present

## 2021-11-23 DIAGNOSIS — G4089 Other seizures: Secondary | ICD-10-CM | POA: Diagnosis not present

## 2021-11-23 DIAGNOSIS — G4730 Sleep apnea, unspecified: Secondary | ICD-10-CM

## 2021-11-23 DIAGNOSIS — I5022 Chronic systolic (congestive) heart failure: Secondary | ICD-10-CM | POA: Diagnosis not present

## 2021-11-23 DIAGNOSIS — M6389 Disorders of muscle in diseases classified elsewhere, multiple sites: Secondary | ICD-10-CM | POA: Diagnosis not present

## 2021-11-23 DIAGNOSIS — G8114 Spastic hemiplegia affecting left nondominant side: Secondary | ICD-10-CM

## 2021-11-23 DIAGNOSIS — I69398 Other sequelae of cerebral infarction: Secondary | ICD-10-CM | POA: Diagnosis not present

## 2021-11-23 DIAGNOSIS — I69354 Hemiplegia and hemiparesis following cerebral infarction affecting left non-dominant side: Secondary | ICD-10-CM | POA: Diagnosis not present

## 2021-11-23 DIAGNOSIS — R41840 Attention and concentration deficit: Secondary | ICD-10-CM | POA: Diagnosis not present

## 2021-11-23 DIAGNOSIS — I69391 Dysphagia following cerebral infarction: Secondary | ICD-10-CM | POA: Diagnosis not present

## 2021-11-23 DIAGNOSIS — H538 Other visual disturbances: Secondary | ICD-10-CM | POA: Diagnosis not present

## 2021-11-23 DIAGNOSIS — S065X3S Traumatic subdural hemorrhage with loss of consciousness of 1 hour to 5 hours 59 minutes, sequela: Secondary | ICD-10-CM | POA: Diagnosis not present

## 2021-11-23 MED ORDER — METHYLPHENIDATE HCL 5 MG PO TABS: ORAL_TABLET | ORAL | 0 refills | Status: DC

## 2021-11-23 NOTE — Progress Notes (Signed)
SLEEP MEDICINE CLINIC    Provider:  Larey Seat, MD  Primary Care Physician:  [name] Dad, MD [address] [place] [address]     Referring Provider: Dr Leonie [name]         Chief Complaint according to patient   Patient presents with:     New Patient (Initial Visit)     #10 with wife and son. Pt wife states he only sleep a few hours. Pt wife states she believe that the CPAP has been helping. [facility] apnea patient on BiPAP auto with stroke history.       HISTORY OF PRESENT ILLNESS:  [name] [name] is a 81 y.o. year old White or Caucasian male patient seen in RV on 79/19/2023 . I have the pleasure of meeting [name] [name] today in the presence of his wife and son, he had undergone his sleep study that showed a very complex form of apnea and finally was placed on and so called out to BiPAP the maximum inspiratory pressure was 20 cmH2O the minimum expiratory pressure is set at 5 and the pressure support is 5 cm.  This is a machine that can find its pressure each night and for this reason was chosen as the patient could not attend an in lab sleep study.  His BiPAP therapy shows a high number of central apneas to be still present 11.4/h there are very few obstructive apneas remain 2.4/h he does have a very high air leakage the 95th percentile air leak is 70 L a minute.  The total AHI was 24/h but 9.7 of these were called unknown apneas and I usually related to high air leakage.  The compliance of use is excellent the patient uses the machine every day and at least over 90% for 4 hours or more at night the average user time is 7 hours and 45 minutes.  My goal today is to reduce the air leakage by adding a chinstrap I will put a DME order for this in I also wanted to update his current medical history the patient is followed by [name] [name] as physical rehab specialist and he received Botox injections for the left upper extremity this has helped him to relax his finger flexion and he has  actually shown some left arm movements.  His degree of sleepiness is still severe but he was placed on Ritalin and the family feels that this has allowed him to concentrate longer being cognitively more focused and involved even in conversation now.  The decision to add Ritalin was blessed by [name] [name] his congestive heart failure specialist.  He had a hospital stay in June from June 5 for a seizure and another seizure occurred at home on the ninth 8 September this was milder he did not need to be hospitalized the repetitive involuntary movements were controlled after he was put back on oxygen.  During the summer he was 2.4 weeks 2.5 weeks on oxygen supplementation now weaned off the oxygen was given into the BiPAP.  He enjoys the attention of a very good physical therapist he has occupational therapy and he also has still speech therapy.  Mrs. [name] [name] has asked me if we can further adjust his current apnea treatments and I think I would like to go first with a chinstrap and if this is not possible I would consider in a as the titration in the sleep lab but I would need a nurse to be with him here  at night.[facility] offers a private duty nurse for such occassions.       [name] [name] has a history spanning from [place] 2022 to now raised to very impairing strokes.  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  The progress seem to have been limited somewhat by bouts of lethargy, there are cognitive deficits and especially communication deficits.  He is now in a nursing facility for progressive therapy.   He resides at [facility]  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   He had fallen at church backwards striking the occiput on Jul 26, 2020 and in the emergency room there was initially no acute finding noted the day after he returned with difficulty speaking and now with right-sided weakness.  [name] [name] evacuated the left-sided traumatic subdural hemorrhage that had caused mass effect and subfalcine herniation.       Chief concern according to patient :  Lethargy, possible apnea/   Patient is unable to answer questions other than yes/ no.  Wheelchair bound, paresis,       patient was a snorer, but apneas were not witnessed.     Family medical /sleep history: none. Brother has sleep apnea.     Social history:  Patient is retired from Garment/textile technologist, private- and lives in a [name]  Family status is married .      Sleep habits are as follows: according to his smart watch , he sleeps 2-3 hours, but that is not possible.  Hoyer lift needed, medical bed needed.      Review of Systems: Out of a complete 14 system review, the patient complains of only the following symptoms, and all other reviewed systems are negative.:  Fatigue, sleepiness , snoring, fragmented sleep, Hypersomnia, lives in extended care.    How likely are you to doze in the following situations: 0 = not likely, 1 = slight chance, 2 = moderate chance, 3 = high chance   Sitting and Reading? Every time within 30 minutes Watching Television? " Sitting inactive in a public place (theater or meeting)?" He is always seated.   Lying down in the afternoon when circumstances permit?always  Sitting and talking to someone?0 Sitting quietly after lunch without alcohol? always   [place] 16/ 18. Adjusted to non driving     FSS endorsed at 42/ 63 points.   Social History   Socioeconomic History   Marital status: Married    Spouse name: [name] [name]   Number of children:  2   Years of education: BS   Highest education level: Not on file  Occupational History   Occupation: retired  Tobacco Use   Smoking status: Never   Smokeless tobacco: Never  Vaping Use   Vaping Use: Never used  Substance and Sexual Activity   Alcohol use: Never    Comment: quit 5/22   Drug use: Never   Sexual activity: Not Currently    Partners: Female  Other Topics Concern   Not on file  Social History Narrative   10/27/20 residing at [facility] care center since 09/17/20  [address] - BS, JD. married - '67. 2 sons - '74, '77  one son is a Nurse, learning disability for [employer]; 4 grandchildren. 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 marriage in good health. End-of-life: discussed issues and provided packet with the MOST form and out of facility order.      Caffeine: decaf coffee   Social Determinants of Health   Financial Resource Strain: Low Risk  (06/27/2018)   Overall Financial Resource Strain (CARDIA)    Difficulty of Paying Living Expenses: Not hard at all  Food Insecurity: No Food Insecurity (06/27/2018)   Hunger Vital Sign    Worried About Running Out of Food in the Last Year: Never true    Ran Out of Food in the Last Year: Never true  Transportation Needs: No Transportation Needs (06/27/2018)   PRAPARE - Hydrologist (Medical): No    Lack of Transportation (Non-Medical): No  Physical Activity: Not on file  Stress: Not on file  Social Connections: Not on file    Family History  Problem Relation Age of Onset   Macular degeneration Mother        macular degeneration   Cancer Father        intestinal/GI   Diabetes Paternal Aunt    Heart attack Maternal Grandfather    Diabetes Brother     Past Medical History:  Diagnosis Date   Acute rheumatic heart disease, unspecified    childhood, age  45 & 81   Acute rheumatic pericarditis    Atrial fibrillation (HCC)     history   CHF (congestive heart failure) (HCC)    Diverticulosis    Dysrhythmia    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   External hemorrhoids without mention of complication    H/O aortic valve replacement    H/O mitral valve replacement    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   Lesion of ulnar nerve    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    Other and unspecified hyperlipidemia    Pre-diabetes    Previous back surgery 1978, jan 2007   Psychosexual dysfunction with inhibited sexual excitement    SOB (shortness of breath)    "with heavy exercise"   Stroke ([place]) 08/2013   "I WAS IN AFIB AND THREW A CLOT, THE EFFECTS WERE TRANSITORY AND DIDNT LAST BUT FOR 30 MINUTES"    Thoracic aortic aneurysm [facility])     Past Surgical History:  Procedure Laterality Date   AORTIC AND MITRAL VALVE REPLACEMENT     09/2004   CARDIOVERSION     3 times from 2004-2006   CARDIOVERSION N/A 09/26/2013   Procedure: CARDIOVERSION;  Surgeon: [name] Dresser, MD;  Location: [facility];  Service: Cardiovascular;  Laterality: N/A;   CARDIOVERSION N/A 06/19/2014   Procedure: CARDIOVERSION;  Surgeon: Jerline Pain, MD;  Location: [facility];  Service: Cardiovascular;  Laterality: N/A;   CARDIOVERSION N/A 04/24/2017   Procedure: CARDIOVERSION;  Surgeon: [name] Dresser, MD;  Location: [facility] ENDOSCOPY;  Service: Cardiovascular;  Laterality: N/A;   CARDIOVERSION N/A 06/08/2017   Procedure: CARDIOVERSION;  Surgeon: Pixie Casino, MD;  Location: [facility] ENDOSCOPY;  Service: Cardiovascular;  Laterality: N/A;   CARDIOVERSION N/A 08/24/2017   Procedure: CARDIOVERSION;  Surgeon: Skeet Latch, MD;  Location: [facility] ENDOSCOPY;  Service: Cardiovascular;  Laterality: N/A;   CARDIOVERSION N/A 02/14/2018   Procedure: CARDIOVERSION;  Surgeon: [name] Dresser, MD;  Location: [facility] ENDOSCOPY;  Service: Cardiovascular;  Laterality: N/A;   COLONOSCOPY     CRANIOTOMY N/A 07/28/2020   Procedure: CRANIOTOMY FOR EVACUATION OF  SUBDURAL HEMATOMA;  Surgeon: Eustace Scroggins, MD;  Location: [facility];  Service: Neurosurgery;  Laterality: N/A;   IR ANGIO VERTEBRAL SEL SUBCLAVIAN INNOMINATE UNI R MOD SED  08/07/2020   IR CT HEAD LTD  08/07/2020   IR PERCUTANEOUS ART THROMBECTOMY/INFUSION INTRACRANIAL INC DIAG ANGIO  08/07/2020   laminectomies     10/1975 and in 03/2005   RADIOLOGY WITH ANESTHESIA N/A 08/07/2020   Procedure: IR WITH ANESTHESIA;  Surgeon: Luanne Bras, MD;  Location: [facility];  Service: Radiology;  Laterality: N/A;   TEE WITHOUT CARDIOVERSION N/A 12/18/2020   Procedure: TRANSESOPHAGEAL ECHOCARDIOGRAM (TEE);  Surgeon: [name] Dresser, MD;  Location: [facility] ENDOSCOPY;  Service: Cardiovascular;  Laterality: N/A;   [place] Right 04/03/2018   Procedure: TOTAL HIP ARTHROPLASTY ANTERIOR APPROACH;  Surgeon: Paralee Cancel, MD;  Location: [facility];  Service: Orthopedics;  Laterality: Right;  70 mins     Current Outpatient Medications on File Prior to Visit  Medication Sig Dispense Refill   acetaminophen (TYLENOL) 325 MG tablet Take 650 mg by mouth as needed for mild pain.     Amantadine HCl 100 MG tablet Take 100 mg by mouth 2 (two) times daily.     amLODipine (NORVASC) 5 MG tablet Take 5 mg by mouth daily.     amoxicillin (AMOXIL) 500 MG capsule Take 500 mg by mouth 2 (two) times daily.  *NO STOP DATE* - per pharmacy can open capsule -Do not crush     apixaban (ELIQUIS) 5 MG TABS tablet Take 1 tablet (5 mg total) by mouth 2 (two) times daily. 60 tablet 0   Artificial Saliva (BIOTENE MOISTURIZING MOUTH MT) Use as directed 2 sprays in the mouth or throat in the morning, at noon, and at bedtime.     atorvastatin (LIPITOR) 80 MG tablet Take 1 tablet (80 mg total) by mouth daily.     bisacodyl (DULCOLAX) 10 MG suppository Place 1 suppository (10 mg total) rectally daily as needed for moderate constipation or mild constipation.     clobetasol (TEMOVATE) 0.05 % external solution Apply 1  application  topically as needed (itching).     diazePAM, 15 MG Dose, (VALTOCO 15 MG DOSE) 2 x 7.5 MG/0.1ML LQPK Place 7.5 mg into each nostril (total of 15 mg) for grand mal seizure lasting over 5 minutes. Can repeat once in 4 hours. Do Not use more than twice in 24 hours 2 each 1   dofetilide (TIKOSYN) 125 MCG capsule Take 3 capsules (375 mcg total) by mouth 2 (two) times daily.     famotidine (PEPCID) 20 MG tablet Take 20 mg by mouth at bedtime as needed for indigestion.     finasteride (PROSCAR) 5 MG tablet Take 1 tablet (5 mg total) by mouth daily. 30 tablet 0   furosemide (LASIX) 20 MG tablet Take 1 tablet (20 mg total) by mouth daily. 30 tablet 0   Glucerna (GLUCERNA) LIQD Take 237 mLs by mouth daily.     hydrocortisone 2.5 % cream Apply 1 application. topically daily as needed (itching).     lacosamide (VIMPAT) 50 MG TABS tablet Take 1 tablet (50 mg total) by mouth 2 (two) times daily. 60 tablet 3   levalbuterol (XOPENEX) 0.63 MG/3ML nebulizer solution Take 0.63 mg  by nebulization every 6 (six) hours as needed for wheezing or shortness of breath.     magnesium gluconate (MAGONATE) 500 MG tablet Take 1 tablet (500 mg total) by mouth daily. 30 tablet 0   methenamine (MANDELAMINE) 1 g tablet Take 1,000 mg by mouth 2 (two) times daily.     methylphenidate (RITALIN) 5 MG tablet Take 1 tablet (5 mg total) by mouth 2 (two) times daily. 60 tablet 0   Multiple Vitamin (MULTIVITAMIN WITH MINERALS) TABS tablet Take 1 tablet by mouth daily.     omeprazole (PRILOSEC) 20 MG capsule Take 20 mg by mouth daily.     Polyethyl Glycol-Propyl Glycol (SYSTANE) 0.4-0.3 % SOLN Place 2 drops into both eyes daily.     polyethylene glycol (MIRALAX / GLYCOLAX) 17 g packet Take 17 g by mouth every other day. 14 each    potassium chloride (KLOR-CON) 10 MEQ tablet Take 10 mEq by mouth daily.     senna-docusate (SENOKOT-S) 8.6-50 MG tablet Take 1 tablet by mouth at bedtime as needed.     No current facility-administered  medications on file prior to visit.    Allergies  Allergen Reactions   Naproxen Hives   No [facility] Food Allergies Other (See Comments)    Scallops - distress, nausea and vomitting    Physical exam:  Today's Vitals   11/23/21 1030  BP: 116/69  Pulse: 73   There is no height or weight on file to calculate BMI.   Wt Readings from Last 3 Encounters:  11/15/21 147 lb 9.6 oz (67 kg)  11/11/21 148 lb 9.6 oz (67.4 kg)  11/04/21 148 lb 12.8 oz (67.5 kg)     Ht Readings from Last 3 Encounters:  10/13/21 '5\' 7"'$  (1.702 m)  09/13/21 '5\' 7"'$  (1.702 m)  09/09/21 '5\' 7"'$  (1.702 m)      General: The patient is awake, alert and appears not in acute distress. The patient is well groomed. Head: Normocephalic, atraumatic.  Neck is limited ROM Mallampati 2,  neck circumference:15.75 inches . Nasal airflow  patent.  Retrognathia is not  seen.  Dental status: biological. Cardiovascular:  Regular rate and cardiac rhythm by pulse,  without distended neck veins. Respiratory: Lungs are clear to auscultation.  Skin:  Without evidence of ankle edema, or rash. Trunk: The patient's posture is erect.   Neurologic exam :The patient is drowsy. Memory not tested. Speech is non fluent . Dysphonia.  Mood and affect are appropriate.   Cranial nerves: no loss of smell or taste reported  Pupils are equal and briskly reactive to light. Funduscopic exam deferred. .  Extraocular movements in vertical and horizontal planes were not following.  Visual fields by finger perimetry are intact. Hearing was intact to soft voice and finger rubbing.    Facial sensation intact to fine touch.  left facial weakness, masked face.  Neck ROM : rotation, tilt and flexion extension were normal for age and shoulder shrug was symmetrical.    Motor exam:  Symmetric bulk, tone and ROM.   Normal tone without cog -wheeling, symmetric grip strength .   Sensory:  Fine touch and vibration were tested  and  normal.   Proprioception tested in the upper extremities was normal.   Coordination: spatic hemiparesis left and delayed relaxation on the right had, related to cerebellar lesion.  Gait and station:  deferred.  Deep tendon reflexes: in the upper and lower extremities are spastic. Babinski response was bilaterally up going.  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  Has central apnea and stays for now on auto BipAP.  Chin strap added as he has high residual AHIs.    My Plan is to proceed with:  1)start using with chin strap.  Option B- I no success on chin strap, start planning for in lab study with RN attending. He can sleep in his wheelchair- we need a limited montage- and ASV titration.    I would like to thank [name] Dad, MD and [name] Dad, Md [address] [place],  [place] [address] for allowing me to meet with and to take care of this pleasant patient.   In short, DONYA TOMARO is presenting with Hypersomnia   follow up through NP within 2-3 months if treatment needs to be initiated.     Electronically signed by: [name] Seat, MD 11/23/2021 11:13 AM  [facility] Board certified by The [employer] of Sleep Medicine and Diplomate of the [employer] of Sleep Medicine. Board certified In Neurology through the [place], Fellow of the [employer] of Neurology. Medical Director of [facility].

## 2021-11-23 NOTE — Progress Notes (Signed)
Faxed order for Chinstrap to [facility] at [phone]. Received fax confirmation.

## 2021-11-23 NOTE — Patient Instructions (Signed)
KEEPING IT CLEAN: CPAP HYGIENE PROPER UPKEEP OF YOUR CPAP MACHINE CAN HELP ENSURE THE DEVICE FUNCTIONS PROPERLY CPAP CLEANING INSTRUCTIONS Along with proper CPAP cleaning it is recommended that you replace your mask, tubing and filters once very 3 months and more frequently if you are sick.   DAILY CLEANING Do not use moisturizing soaps, bleach, scented oils, chlorine, or alcohol-based solutions to clean your supplies. These solutions may cause irritation to your skin and lungs and may reduce the life of your products. Dawn [employer] or Comparable works best for daily cleaning.  **If you've been sick, it's smart to wash your mask, tubing, humidifier and filter daily until your cold, flu or virus symptoms are gone. That can help reduce the amount of time you spend under the weather.  Before using your mask -wash your face daily with soap and water to remove excess facial oils. Wipe down your mask (including areas that come in contact with your skin) using a damp towel with soap and warm water. This will remove any oils, dead skin cells, and sweat on the mask that can affect the quality of the seal. Gently rinse with a clean towel and let the mask air-dry out of direct sunlight. You can also use unscented baby wipes or pre-moistened towels designed specifically for cleaning CPAP masks, which are available on-line. DO NOT USE CLOROX OR DISINFECTING WIPES. If your unit has a humidifier, empty any leftover water instead of letting in sit in the unit all day. Refill the humidifier with clean, distilled water right before bedtime for optimal use WEEKLY (OR MORE FREQUENT) CLEANING Your mask and tubing need a full bath at least once a week to keep it free of dust, bacteria, and germs. (During COVID-19 or any other flu/virus we recommend more frequent cleaning) Clean the CPAP tubing, nasal mask, and headgear in a bathroom sink filled with warm water and a few drops of ammonia-free, mild dish detergent. Avoid  using stronger cleaning products, as they may damage the mask or leave harmful residue. Swirl all parts around for about five minutes, rinse well and let air dry during the day. Hang the tubing over the shower rod, on a towel rack or in the laundry room to ensure all the water drips out. The mask and headgear can be air-dried on a towel or hung on a hook or hanger. You should also wipe down your CPAP machine with a damp cloth. Ensure the unit is unplugged. The towel shouldn't be too damp or wet, as water could get into the machine. Clean the filter by removing it and rinsing it in warm tap water. Run it under the water and squeeze to make sure there is no dust. Then blot down the filter with a towel. Do not wash your machine's white filter, if one is present--those are disposable and should be replaced every two weeks. If you are recovering from being sick, we recommend changing the filter sooner. If your CPAP has a humidifier, that also needs to be cleaned weekly. Empty any remaining water and then wash the water chamber in the sink with warm soapy water. Rinse well and drain out as much of the water as possible. Let the chamber air-dry before placing it back into the CPAP unit. Every other week you should disinfect the humidifier. Do that by soaking it in a solution of one-part vinegar to five parts water for 30 minutes, thoroughly rinsing and then placing in your dishwasher's top rack for washing. And keep  it clean by using only distilled water to prevent mineral deposits that can build up and cause damage to your machine. IMPORTANT TIPS Make caring for your CPAP equipment part of your morning routine. Keep machine and accessories out of direct sunlight to avoid damaging them. Never use bleach to clean accessories. Place machine on a level surface and away from curtains that may interfere with the air intake. Keep track of when you should order replacement parts for your mask and accessories so that  you always get the most out of your CPAP. You can also sign up for Auto Supply by contacting our DME department .

## 2021-11-23 NOTE — Addendum Note (Signed)
Addended by: Larey Seat on: 11/23/2021 11:35 AM   Modules accepted: Orders

## 2021-11-24 DIAGNOSIS — R41841 Cognitive communication deficit: Secondary | ICD-10-CM | POA: Diagnosis not present

## 2021-11-24 DIAGNOSIS — I5022 Chronic systolic (congestive) heart failure: Secondary | ICD-10-CM | POA: Diagnosis not present

## 2021-11-24 DIAGNOSIS — I69812 Visuospatial deficit and spatial neglect following other cerebrovascular disease: Secondary | ICD-10-CM | POA: Diagnosis not present

## 2021-11-24 DIAGNOSIS — R293 Abnormal posture: Secondary | ICD-10-CM | POA: Diagnosis not present

## 2021-11-24 DIAGNOSIS — G4089 Other seizures: Secondary | ICD-10-CM | POA: Diagnosis not present

## 2021-11-24 DIAGNOSIS — S065X3S Traumatic subdural hemorrhage with loss of consciousness of 1 hour to 5 hours 59 minutes, sequela: Secondary | ICD-10-CM | POA: Diagnosis not present

## 2021-11-24 DIAGNOSIS — R278 Other lack of coordination: Secondary | ICD-10-CM | POA: Diagnosis not present

## 2021-11-24 DIAGNOSIS — I69391 Dysphagia following cerebral infarction: Secondary | ICD-10-CM | POA: Diagnosis not present

## 2021-11-24 DIAGNOSIS — H538 Other visual disturbances: Secondary | ICD-10-CM | POA: Diagnosis not present

## 2021-11-24 DIAGNOSIS — M6389 Disorders of muscle in diseases classified elsewhere, multiple sites: Secondary | ICD-10-CM | POA: Diagnosis not present

## 2021-11-24 DIAGNOSIS — R41840 Attention and concentration deficit: Secondary | ICD-10-CM | POA: Diagnosis not present

## 2021-11-24 DIAGNOSIS — I69354 Hemiplegia and hemiparesis following cerebral infarction affecting left non-dominant side: Secondary | ICD-10-CM | POA: Diagnosis not present

## 2021-11-25 ENCOUNTER — Non-Acute Institutional Stay (SKILLED_NURSING_FACILITY): Payer: Medicare Other | Admitting: Adult Health

## 2021-11-25 ENCOUNTER — Encounter: Payer: Self-pay | Admitting: Adult Health

## 2021-11-25 DIAGNOSIS — I5033 Acute on chronic diastolic (congestive) heart failure: Secondary | ICD-10-CM

## 2021-11-25 DIAGNOSIS — R293 Abnormal posture: Secondary | ICD-10-CM | POA: Diagnosis not present

## 2021-11-25 DIAGNOSIS — R278 Other lack of coordination: Secondary | ICD-10-CM | POA: Diagnosis not present

## 2021-11-25 DIAGNOSIS — I5022 Chronic systolic (congestive) heart failure: Secondary | ICD-10-CM | POA: Diagnosis not present

## 2021-11-25 DIAGNOSIS — R41841 Cognitive communication deficit: Secondary | ICD-10-CM | POA: Diagnosis not present

## 2021-11-25 DIAGNOSIS — I69391 Dysphagia following cerebral infarction: Secondary | ICD-10-CM | POA: Diagnosis not present

## 2021-11-25 DIAGNOSIS — G4089 Other seizures: Secondary | ICD-10-CM | POA: Diagnosis not present

## 2021-11-25 DIAGNOSIS — S065X3S Traumatic subdural hemorrhage with loss of consciousness of 1 hour to 5 hours 59 minutes, sequela: Secondary | ICD-10-CM | POA: Diagnosis not present

## 2021-11-25 DIAGNOSIS — H538 Other visual disturbances: Secondary | ICD-10-CM | POA: Diagnosis not present

## 2021-11-25 DIAGNOSIS — I69812 Visuospatial deficit and spatial neglect following other cerebrovascular disease: Secondary | ICD-10-CM | POA: Diagnosis not present

## 2021-11-25 DIAGNOSIS — I69354 Hemiplegia and hemiparesis following cerebral infarction affecting left non-dominant side: Secondary | ICD-10-CM | POA: Diagnosis not present

## 2021-11-25 DIAGNOSIS — M6389 Disorders of muscle in diseases classified elsewhere, multiple sites: Secondary | ICD-10-CM | POA: Diagnosis not present

## 2021-11-25 DIAGNOSIS — R41840 Attention and concentration deficit: Secondary | ICD-10-CM | POA: Diagnosis not present

## 2021-11-25 NOTE — Progress Notes (Signed)
Location:  Occupational psychologist of Service:  SNF (31) Provider:   Cindi Carbon, [place] [phone]   [name], MD  Patient Care Team: [name], MD as PCP - General (Internal Medicine) Larey Dresser, MD as PCP - Cardiology (Cardiology) [name], MD as Consulting Physician (Ophthalmology) [name], MD (Internal Medicine)  Extended Emergency Contact Information Primary Emergency Contact: [name] Address: [address]          [place], [place] [address] [place] Phone: [phone] Mobile Phone: [phone] Relation: Spouse Secondary Emergency Contact: [name], [place] [address] [name] of [place] Phone: [phone] Relation: Son  Code Status:  DNR Goals of care: Advanced Directive information    09/13/2021   10:08 AM  Advanced Directives  Does Patient Have a Medical Advance Directive? Yes  Type of Paramedic of [place];Living will;Out of facility DNR (pink MOST or yellow form)  Does patient want to make changes to medical advance directive? No - Patient declined  Copy of [place] in Chart? Yes - validated most recent copy scanned in chart (See row information)     Chief Complaint  Patient presents with   Acute Visit    Weight gain    HPI:  Pt is a 81 y.o. male seen today for an acute visit for weight gain and increased RR rate.  PMH includes syncope with fall in May of 2022, TBI with SDH with craniotomy, acute right MCA s/p thrombectomy, bioprosthetic mitral and aortic valve replacement, DM II, diastiolic CHF, BPH, and afib.  On life long amoxicillin for endocarditis  Echo 03/31/21 EF 45-50%  Nurse reports RR of 24, sat 95%, and weight gain 3 lbs this week. He has not needed oxygen. Has mild appearance of SOB. WIfe also reports dry cough. Currently tolerating 20 mg of lasix daily.  Previously has had issues with hypernatremia so we are monitoring his BMP weekly. He is drinking more fluids and more alert now that he is on ritalin. HR has been controlled.  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    Other and unspecified hyperlipidemia    Pre-diabetes    Previous back surgery 1978, jan 2007   Psychosexual dysfunction with inhibited sexual excitement    SOB (shortness of breath)    "with heavy exercise"   Stroke ([place]) 08/2013   "I WAS IN AFIB AND THREW A CLOT, THE EFFECTS WERE TRANSITORY AND DIDNT LAST BUT FOR 30 MINUTES"    Thoracic aortic aneurysm [facility])    Past Surgical History:  Procedure Laterality Date   AORTIC AND MITRAL VALVE REPLACEMENT     09/2004   CARDIOVERSION     3 times from 2004-2006   CARDIOVERSION N/A 09/26/2013   Procedure: CARDIOVERSION;  Surgeon: Larey Dresser, MD;  Location: [facility];  Service: Cardiovascular;  Laterality: N/A;   CARDIOVERSION N/A 06/19/2014   Procedure: CARDIOVERSION;  Surgeon: Jerline Pain, MD;  Location: [facility];  Service: Cardiovascular;  Laterality: N/A;   CARDIOVERSION N/A 04/24/2017   Procedure: CARDIOVERSION;  Surgeon: Larey Dresser, MD;  Location: [facility] ENDOSCOPY;  Service: Cardiovascular;  Laterality: N/A;   CARDIOVERSION N/A 06/08/2017   Procedure: CARDIOVERSION;  Surgeon: Pixie Casino, MD;  Location: [facility] ENDOSCOPY;  Service: Cardiovascular;  Laterality: N/A;   CARDIOVERSION N/A 08/24/2017   Procedure: CARDIOVERSION;   Surgeon: Skeet Latch, MD;  Location[facility] ENDOSCOPY;  Service: Cardiovascular;  Laterality: N/A;   CARDIOVERSION N/A 02/14/2018   Procedure: CARDIOVERSION;  Surgeon: Larey Dresser, MD;  Location: [facility] ENDOSCOPY;  Service: Cardiovascular;  Laterality: N/A;   COLONOSCOPY     CRANIOTOMY N/A 07/28/2020   Procedure: CRANIOTOMY FOR EVACUATION OF SUBDURAL HEMATOMA;  Surgeon: Eustace Esson, MD;  Location: [place];  Service: Neurosurgery;  Laterality: N/A;   IR ANGIO VERTEBRAL SEL SUBCLAVIAN INNOMINATE UNI R MOD SED  08/07/2020   IR CT HEAD LTD  08/07/2020   IR PERCUTANEOUS ART THROMBECTOMY/INFUSION INTRACRANIAL INC DIAG ANGIO  08/07/2020   laminectomies     10/1975 and in 03/2005   RADIOLOGY WITH ANESTHESIA N/A 08/07/2020   Procedure: IR WITH ANESTHESIA;  Surgeon: Luanne Bras, MD;  Location: [facility];  Service: Radiology;  Laterality: N/A;   TEE WITHOUT CARDIOVERSION N/A 12/18/2020   Procedure: TRANSESOPHAGEAL ECHOCARDIOGRAM (TEE);  Surgeon: Larey Dresser, MD;  Location: [facility] ENDOSCOPY;  Service: Cardiovascular;  Laterality: N/A;   [place] Right 04/03/2018   Procedure: TOTAL HIP ARTHROPLASTY ANTERIOR APPROACH;  Surgeon: Paralee Cancel, MD;  Location: [facility] ORS;  Service: Orthopedics;  Laterality: Right;  70 mins    Allergies  Allergen Reactions   Naproxen Hives   No [facility] Food Allergies Other (See Comments)    Scallops - distress, nausea and vomitting    Outpatient Encounter Medications as of 11/25/2021  Medication Sig   acetaminophen (TYLENOL) 325 MG tablet Take 650 mg by mouth as needed for mild pain.   Amantadine HCl 100 MG tablet Take 100 mg by mouth 2 (two) times daily.   amLODipine (NORVASC) 5 MG tablet Take 5 mg by mouth daily.   amoxicillin (AMOXIL) 500 MG capsule Take 500 mg by mouth 2 (two) times daily.  *NO STOP DATE* - per pharmacy can open capsule -Do not crush   apixaban (ELIQUIS) 5 MG TABS tablet Take 1 tablet (5 mg total) by mouth 2  (two) times daily.   Artificial Saliva (BIOTENE MOISTURIZING MOUTH MT) Use as directed 2 sprays in the mouth or throat in the morning, at noon, and at bedtime.   atorvastatin (LIPITOR) 80 MG tablet Take 1 tablet (80 mg total) by mouth daily.   bisacodyl (DULCOLAX) 10 MG suppository Place 1 suppository (10 mg total) rectally daily as needed for moderate constipation or mild constipation.   clobetasol (TEMOVATE) 0.05 % external solution Apply 1 application  topically as needed (itching).   diazePAM, 15 MG Dose, (VALTOCO 15 MG DOSE) 2 x 7.5 MG/0.1ML LQPK Place 7.5 mg into each nostril (total of 15 mg) for grand mal seizure lasting over 5 minutes. Can repeat once in 4 hours. Do Not use more than twice in 24 hours   dofetilide (TIKOSYN) 125 MCG capsule Take 3 capsules (375 mcg total) by mouth 2 (  two) times daily.   famotidine (PEPCID) 20 MG tablet Take 20 mg by mouth at bedtime as needed for indigestion.   finasteride (PROSCAR) 5 MG tablet Take 1 tablet (5 mg total) by mouth daily.   furosemide (LASIX) 20 MG tablet Take 1 tablet (20 mg total) by mouth daily.   Glucerna (GLUCERNA) LIQD Take 237 mLs by mouth daily.   hydrocortisone 2.5 % cream Apply 1 application. topically daily as needed (itching).   lacosamide (VIMPAT) 50 MG TABS tablet Take 1 tablet (50 mg total) by mouth 2 (two) times daily.   levalbuterol (XOPENEX) 0.63 MG/3ML nebulizer solution Take 0.63 mg by nebulization every 6 (six) hours as needed for wheezing or shortness of breath.   magnesium gluconate (MAGONATE) 500 MG tablet Take 1 tablet (500 mg total) by mouth daily.   methenamine (MANDELAMINE) 1 g tablet Take 1,000 mg by mouth 2 (two) times daily.   methylphenidate (RITALIN) 5 MG tablet Use at 7 AM  and again at 2 PM po   Multiple Vitamin (MULTIVITAMIN WITH MINERALS) TABS tablet Take 1 tablet by mouth daily.   omeprazole (PRILOSEC) 20 MG capsule Take 20 mg by mouth daily.   Polyethyl Glycol-Propyl Glycol (SYSTANE) 0.4-0.3 % SOLN Place  2 drops into both eyes daily.   polyethylene glycol (MIRALAX / GLYCOLAX) 17 g packet Take 17 g by mouth every other day.   potassium chloride (KLOR-CON) 10 MEQ tablet Take 10 mEq by mouth daily.   senna-docusate (SENOKOT-S) 8.6-50 MG tablet Take 1 tablet by mouth at bedtime as needed.   No facility-administered encounter medications on file as of 11/25/2021.    Review of Systems  Unable to perform ROS: Patient nonverbal    Immunization History  Administered Date(s) Administered   Fluad Quad(high Dose 65+) 12/06/2018, 12/09/2020   Influenza Whole 12/25/2008, 01/05/2010, 12/30/2011   Influenza, High Dose Seasonal PF 12/14/2016   Influenza,inj,Quad PF,6+ Mos 12/19/2013   Influenza,inj,quad, With Preservative 11/22/2017   Influenza-Unspecified 12/14/2015, 12/24/2019   Moderna Covid-19 Vaccine Bivalent Booster 15yr & up 01/12/2021   Moderna SARS-COV2 Booster Vaccination 01/02/2020, 06/26/2020   Moderna Sars-Covid-2 Vaccination 04/08/2019, 05/05/2019   Pneumococcal Conjugate-13 12/19/2013   Pneumococcal Polysaccharide-23 09/24/2007, 05/22/2017   Td 12/25/2008   Tdap 03/22/2018   Zoster Recombinat (Shingrix) 07/16/2019, 10/28/2019   Zoster, Live 09/24/2007   Pertinent  Health Maintenance Due  Topic Date Due   INFLUENZA VACCINE  10/05/2021   OPHTHALMOLOGY EXAM  10/06/2022 (Originally 10/04/1950)   HEMOGLOBIN A1C  01/05/2022   FOOT EXAM  10/23/2022   COLONOSCOPY (Pts 45-427yrInsurance coverage will need to be confirmed)  Discontinued      08/10/2021   11:10 PM 08/11/2021    8:00 AM 08/11/2021    7:30 PM 08/12/2021    8:00 AM 10/13/2021    2:32 PM  Fa[place]ak[place] the past year?     0  Patient Fall Risk Level High fall risk High fall risk High fall risk High fall risk    Functional Status Survey:    Vitals:   11/25/21 1854  BP: 116/72  Pulse: 80  Resp: (!) 24  Temp: 98.1 F (36.7 C)  SpO2: 95%  Weight: 150 lb 6.4 oz (68.2 kg)   Body mass index is 23.56 kg/m. Physical  Exam Constitutional:      General: He is not in acute distress.    Appearance: He is not diaphoretic.  HENT:     Head: Normocephalic and atraumatic.  Mouth/Throat:     Mouth: Mucous membranes are moist.     Pharynx: Oropharynx is clear.  Neck:     Thyroid: No thyromegaly.     Vascular: No JVD.     Trachea: No tracheal deviation.  Cardiovascular:     Rate and Rhythm: Normal rate and regular rhythm.     Heart sounds: No murmur heard. Pulmonary:     Effort: Pulmonary effort is normal.     Breath sounds: Rales present. No wheezing.     Comments: Mild increased WOB Abdominal:     General: Bowel sounds are normal. There is no distension.     Palpations: Abdomen is soft.     Tenderness: There is no abdominal tenderness.  Musculoskeletal:     Comments: Trace edema to ankles  Lymphadenopathy:     Cervical: No cervical adenopathy.  Skin:    General: Skin is warm and dry.  Neurological:     Mental Status: He is alert. Mental status is at baseline.     Comments: Left sided weakness     Labs reviewed: Recent Labs    12/18/20 1630 12/19/20 0610 12/19/20 1632 12/20/20 0153 02/08/21 0000 02/09/21 0000 08/10/21 8270 08/10/21 0957 08/11/21 0408 08/12/21 0107 08/16/21 0000 11/08/21 0000 11/15/21 0000 11/22/21 0000  NA  --  139  --    < > 150*   < > 141   < > 140 142   < > 140 137 140  K  --  3.5  --    < > 3.7   < > 3.8   < > 3.6 3.9   < > 3.8 4.1 3.8  CL  --  108  --    < > 107   < > 111  --  109 113*   < > 104 102 105  CO2  --  23  --    < > 22   < > 16*  --  24 23   < > 24* 25* 23*  GLUCOSE  --  186*  --    < >  --    < > 148*  --  120* 143*  --   --   --   --   BUN  --  16  --    < > 23*   < > 22  --  17 17   < > 22* 18 19  CREATININE  --  0.76  --    < > 0.9   < > 1.18  --  0.81 0.83   < > 0.8 0.7 0.8  CALCIUM  --  7.7*  --    < > 9.9   < > 8.8*  --  8.6* 8.7*   < > 9.4 9.6 9.1  MG 1.9 2.0 1.9   < > 2.1  --   --   --  1.9 2.0  --   --   --   --   PHOS 3.1 2.7 2.4*   --   --   --   --   --   --   --   --   --   --   --    < > = values in this interval not displayed.   Recent Labs    07/12/21 1056 08/10/21 0937 08/11/21 0408 09/07/21 0000 09/08/21 0000  AST '22 29 27 24 24  '$ ALT '22 26 23 18 18  '$ ALKPHOS  --  93 80 105 105  BILITOT 0.7 0.5 0.8  --   --   PROT 7.2 6.4* 5.8*  --   --   ALBUMIN  --  3.3* 3.1* 3.5 3.5   Recent Labs    07/12/21 1056 08/10/21 0937 08/10/21 0957 08/11/21 0408 08/12/21 0107 09/07/21 0000 10/31/21 0000 11/01/21 0000 11/08/21 0000  WBC 9.0 16.1*  --  9.1 10.2   < > 9.3 8.9 7.9  NEUTROABS 6,804 14.3*  --   --  7.8*  --   --   --   --   HGB 10.9* 10.0*   < > 9.4* 9.4*   < > 9.1* 9.4* 9.9*  HCT 33.9* 31.6*   < > 28.7* 29.8*   < > 27* 28* 30*  MCV 90.4 96.3  --  92.0 94.3  --   --   --   --   PLT 426* 238  --  218 227   < > 228 258 295   < > = values in this interval not displayed.   Lab Results  Component Value Date   TSH 1.773 01/24/2021   Lab Results  Component Value Date   HGBA1C 5.7 07/05/2021   Lab Results  Component Value Date   CHOL 110 07/05/2021   HDL 43 07/05/2021   LDLCALC 57 07/05/2021   TRIG 51 07/05/2021   CHOLHDL 2.8 08/08/2020    Significant Diagnostic Results in last 30 days:  No results found.  Assessment/Plan 1. Acute on chronic diastolic CHF (congestive heart failure) (HCC) Continue lasix 20 mg daily Given additional 20 mg today Monitor weight Monitor I and O and resp status May need additional scheduled dosing Now doing well with lasix with no hypernatremia BMP checked weekly  F/U with cardiology     Family/ staff Communication: called Glenna Fellows ordered:  BMP

## 2021-11-26 ENCOUNTER — Encounter: Payer: Self-pay | Admitting: Adult Health

## 2021-11-26 ENCOUNTER — Non-Acute Institutional Stay (SKILLED_NURSING_FACILITY): Payer: Medicare Other | Admitting: Adult Health

## 2021-11-26 ENCOUNTER — Telehealth: Payer: Self-pay | Admitting: Adult Health

## 2021-11-26 DIAGNOSIS — R0902 Hypoxemia: Secondary | ICD-10-CM

## 2021-11-26 DIAGNOSIS — S065X3S Traumatic subdural hemorrhage with loss of consciousness of 1 hour to 5 hours 59 minutes, sequela: Secondary | ICD-10-CM | POA: Diagnosis not present

## 2021-11-26 DIAGNOSIS — I69391 Dysphagia following cerebral infarction: Secondary | ICD-10-CM | POA: Diagnosis not present

## 2021-11-26 DIAGNOSIS — I5033 Acute on chronic diastolic (congestive) heart failure: Secondary | ICD-10-CM | POA: Diagnosis not present

## 2021-11-26 DIAGNOSIS — I69812 Visuospatial deficit and spatial neglect following other cerebrovascular disease: Secondary | ICD-10-CM | POA: Diagnosis not present

## 2021-11-26 DIAGNOSIS — H538 Other visual disturbances: Secondary | ICD-10-CM | POA: Diagnosis not present

## 2021-11-26 DIAGNOSIS — R293 Abnormal posture: Secondary | ICD-10-CM | POA: Diagnosis not present

## 2021-11-26 DIAGNOSIS — M6389 Disorders of muscle in diseases classified elsewhere, multiple sites: Secondary | ICD-10-CM | POA: Diagnosis not present

## 2021-11-26 DIAGNOSIS — R278 Other lack of coordination: Secondary | ICD-10-CM | POA: Diagnosis not present

## 2021-11-26 DIAGNOSIS — R41841 Cognitive communication deficit: Secondary | ICD-10-CM | POA: Diagnosis not present

## 2021-11-26 DIAGNOSIS — G4089 Other seizures: Secondary | ICD-10-CM | POA: Diagnosis not present

## 2021-11-26 DIAGNOSIS — I5022 Chronic systolic (congestive) heart failure: Secondary | ICD-10-CM | POA: Diagnosis not present

## 2021-11-26 DIAGNOSIS — I69354 Hemiplegia and hemiparesis following cerebral infarction affecting left non-dominant side: Secondary | ICD-10-CM | POA: Diagnosis not present

## 2021-11-26 DIAGNOSIS — R41840 Attention and concentration deficit: Secondary | ICD-10-CM | POA: Diagnosis not present

## 2021-11-26 NOTE — Progress Notes (Signed)
Location:  Occupational psychologist of Service:  SNF (31) Provider:   Cindi Carbon, [place] [phone]   [name], MD  Patient Care Team: [name], MD as PCP - General (Internal Medicine) Larey Dresser, MD as PCP - Cardiology (Cardiology) [name], MD as Consulting Physician (Ophthalmology) [name], MD (Internal Medicine)  Extended Emergency Contact Information Primary Emergency Contact: [name] Address: [address]          [place], [place] [address] [place] Phone: [phone] Mobile Phone: [phone] Relation: Spouse Secondary Emergency Contact: [name], [place] [address] [name] Phone: [phone] Relation: Son  Code Status:  DNR Goals of care: Advanced Directive information    09/13/2021   10:08 AM  Advanced Directives  Does Patient Have a Medical Advance Directive? Yes  Type of Paramedic of [place];Living will;Out of facility DNR (pink MOST or yellow form)  Does patient want to make changes to medical advance directive? No - Patient declined  Copy of [place] in Chart? Yes - validated most recent copy scanned in chart (See row information)     Chief Complaint  Patient presents with   Acute Visit    Low sat, RR up    HPI:  Pt is a 81 y.o. male seen today for an acute visit for weight gain and low oxygen sat with increased RR>  PMH includes syncope with fall in May of 2022, TBI with SDH with craniotomy, acute right MCA s/p thrombectomy, bioprosthetic mitral and aortic valve replacement, DM II, diastiolic CHF, BPH, and afib.  On life long amoxicillin for endocarditis  Echo 03/31/21 EF 45-50% He gained 3 lbs in the past week. He was given 40 mg of lasix on 9/21. He has not lost weight. Has some mild edema in his left arm and ankles. He is requiring oxygen at 2 liters.  Sats 87% on RA and 95% on 2 liters. Has mild increased wob and mild increased resp rate. Nurse reports CNA heard wheezing.   EKG was done 11/26/21 which showed normal sinus rhythm with first degree AV block. Did have PVC.   He has not seemed like himself per his wife. I asked him to use fingers to communicate. One finger for yes and 2 fingers for now. He said no to sob or pain. He chose one finger when asked if he loved his wife.    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    Other and unspecified hyperlipidemia    Pre-diabetes    Previous back surgery 1978, jan 2007   Psychosexual dysfunction with inhibited sexual excitement    SOB (shortness of breath)    "with heavy exercise"   Stroke ([place]) 08/2013   "I WAS  IN AFIB AND THREW A CLOT, THE EFFECTS WERE TRANSITORY AND DIDNT LAST BUT FOR 30 MINUTES"    Thoracic aortic aneurysm [facility])    Past Surgical History:  Procedure Laterality Date   AORTIC AND MITRAL VALVE REPLACEMENT     09/2004   CARDIOVERSION     3 times from 2004-2006   CARDIOVERSION N/A 09/26/2013   Procedure: CARDIOVERSION;  Surgeon: Larey Dresser, MD;  Location: [place];  Service: Cardiovascular;  Laterality: N/A;   CARDIOVERSION N/A 06/19/2014   Procedure: CARDIOVERSION;  Surgeon: Jerline Pain, MD;  Location: [place];  Service: Cardiovascular;  Laterality: N/A;   CARDIOVERSION N/A 04/24/2017   Procedure: CARDIOVERSION;  Surgeon: Larey Dresser, MD;  Location: [place];   Service: Cardiovascular;  Laterality: N/A;   CARDIOVERSION N/A 06/08/2017   Procedure: CARDIOVERSION;  Surgeon: Pixie Casino, MD;  Location: [facility] ENDOSCOPY;  Service: Cardiovascular;  Laterality: N/A;   CARDIOVERSION N/A 08/24/2017   Procedure: CARDIOVERSION;  Surgeon: Skeet Latch, MD;  Location: [facility] ENDOSCOPY;  Service: Cardiovascular;  Laterality: N/A;   CARDIOVERSION N/A 02/14/2018   Procedure: CARDIOVERSION;  Surgeon: Larey Dresser, MD;  Location: [facility] ENDOSCOPY;  Service: Cardiovascular;  Laterality: N/A;   COLONOSCOPY     CRANIOTOMY N/A 07/28/2020   Procedure: CRANIOTOMY FOR EVACUATION OF SUBDURAL HEMATOMA;  Surgeon: Eustace Consalvo, MD;  Location: [place];  Service: Neurosurgery;  Laterality: N/A;   IR ANGIO VERTEBRAL SEL SUBCLAVIAN INNOMINATE UNI R MOD SED  08/07/2020   IR CT HEAD LTD  08/07/2020   IR PERCUTANEOUS ART THROMBECTOMY/INFUSION INTRACRANIAL INC DIAG ANGIO  08/07/2020   laminectomies     10/1975 and in 03/2005   RADIOLOGY WITH ANESTHESIA N/A 08/07/2020   Procedure: IR WITH ANESTHESIA;  Surgeon: Luanne Bras, MD;  Location: [facility];  Service: Radiology;  Laterality: N/A;   TEE WITHOUT CARDIOVERSION N/A 12/18/2020   Procedure: TRANSESOPHAGEAL ECHOCARDIOGRAM (TEE);  Surgeon: Larey Dresser, MD;  Location: [facility] ENDOSCOPY;  Service: Cardiovascular;  Laterality: N/A;   [place] Right 04/03/2018   Procedure: TOTAL HIP ARTHROPLASTY ANTERIOR APPROACH;  Surgeon: Paralee Cancel, MD;  Location: [facility] ORS;  Service: Orthopedics;  Laterality: Right;  70 mins    Allergies  Allergen Reactions   Naproxen Hives   No [facility] Food Allergies Other (See Comments)    Scallops - distress, nausea and vomitting    Outpatient Encounter Medications as of 11/26/2021  Medication Sig   acetaminophen (TYLENOL) 325 MG tablet Take 650 mg by mouth as needed for mild pain.   Amantadine HCl 100 MG tablet Take 100 mg by mouth 2 (two) times daily.   amLODipine  (NORVASC) 5 MG tablet Take 5 mg by mouth daily.   amoxicillin (AMOXIL) 500 MG capsule Take 500 mg by mouth 2 (two) times daily.  *NO STOP DATE* - per pharmacy can open capsule -Do not crush   apixaban (ELIQUIS) 5 MG TABS tablet Take 1 tablet (5 mg total) by mouth 2 (two) times daily.   Artificial Saliva (BIOTENE MOISTURIZING MOUTH MT) Use as directed 2 sprays in the mouth or throat in the morning, at noon, and at bedtime.   atorvastatin (LIPITOR) 80 MG tablet Take 1 tablet (80 mg total) by mouth daily.   bisacodyl (DULCOLAX) 10 MG suppository Place 1 suppository (10 mg total) rectally daily as needed for moderate constipation or mild constipation.   clobetasol (TEMOVATE) 0.05 % external solution Apply 1 application  topically  as needed (itching).   diazePAM, 15 MG Dose, (VALTOCO 15 MG DOSE) 2 x 7.5 MG/0.1ML LQPK Place 7.5 mg into each nostril (total of 15 mg) for grand mal seizure lasting over 5 minutes. Can repeat once in 4 hours. Do Not use more than twice in 24 hours   dofetilide (TIKOSYN) 125 MCG capsule Take 3 capsules (375 mcg total) by mouth 2 (two) times daily.   famotidine (PEPCID) 20 MG tablet Take 20 mg by mouth at bedtime as needed for indigestion.   finasteride (PROSCAR) 5 MG tablet Take 1 tablet (5 mg total) by mouth daily.   furosemide (LASIX) 20 MG tablet Take 1 tablet (20 mg total) by mouth daily. (Patient taking differently: Take 40 mg by mouth daily. X 2 days then reduce back to 20 mg)   Glucerna (GLUCERNA) LIQD Take 237 mLs by mouth daily.   hydrocortisone 2.5 % cream Apply 1 application. topically daily as needed (itching).   lacosamide (VIMPAT) 50 MG TABS tablet Take 1 tablet (50 mg total) by mouth 2 (two) times daily.   levalbuterol (XOPENEX) 0.63 MG/3ML nebulizer solution Take 0.63 mg by nebulization every 6 (six) hours as needed for wheezing or shortness of breath.   magnesium gluconate (MAGONATE) 500 MG tablet Take 1 tablet (500 mg total) by mouth daily.   methenamine  (MANDELAMINE) 1 g tablet Take 1,000 mg by mouth 2 (two) times daily.   methylphenidate (RITALIN) 5 MG tablet Use at 7 AM  and again at 2 PM po   Multiple Vitamin (MULTIVITAMIN WITH MINERALS) TABS tablet Take 1 tablet by mouth daily.   omeprazole (PRILOSEC) 20 MG capsule Take 20 mg by mouth daily.   Polyethyl Glycol-Propyl Glycol (SYSTANE) 0.4-0.3 % SOLN Place 2 drops into both eyes daily.   polyethylene glycol (MIRALAX / GLYCOLAX) 17 g packet Take 17 g by mouth every other day.   potassium chloride (KLOR-CON) 10 MEQ tablet Take 20 mEq by mouth daily. X 2 days then return to 10 meq   senna-docusate (SENOKOT-S) 8.6-50 MG tablet Take 1 tablet by mouth at bedtime as needed.   No facility-administered encounter medications on file as of 11/26/2021.    Review of Systems  Unable to perform ROS: Patient nonverbal    Immunization History  Administered Date(s) Administered   Fluad Quad(high Dose 65+) 12/06/2018, 12/09/2020   Influenza Whole 12/25/2008, 01/05/2010, 12/30/2011   Influenza, High Dose Seasonal PF 12/14/2016   Influenza,inj,Quad PF,6+ Mos 12/19/2013   Influenza,inj,quad, With Preservative 11/22/2017   Influenza-Unspecified 12/14/2015, 12/24/2019   Moderna Covid-19 Vaccine Bivalent Booster 25yr & up 01/12/2021   Moderna SARS-COV2 Booster Vaccination 01/02/2020, 06/26/2020   Moderna Sars-Covid-2 Vaccination 04/08/2019, 05/05/2019   Pneumococcal Conjugate-13 12/19/2013   Pneumococcal Polysaccharide-23 09/24/2007, 05/22/2017   Td 12/25/2008   Tdap 03/22/2018   Zoster Recombinat (Shingrix) 07/16/2019, 10/28/2019   Zoster, Live 09/24/2007   Pertinent  Health Maintenance Due  Topic Date Due   INFLUENZA VACCINE  10/05/2021   OPHTHALMOLOGY EXAM  10/06/2022 (Originally 10/04/1950)   HEMOGLOBIN A1C  01/05/2022   FOOT EXAM  10/23/2022   COLONOSCOPY (Pts 45-436yrInsurance coverage will need to be confirmed)  Discontinued      08/10/2021   11:10 PM 08/11/2021    8:00 AM 08/11/2021    7:30  PM 08/12/2021    8:00 AM 10/13/2021    2:32 PM  [facility] [place] the past year?     0  Patient Fall Risk Level High fall risk High fall  risk High fall risk High fall risk    Functional Status Survey:    Vitals:   11/26/21 1443  BP: 116/72  Pulse: 86  Resp: (!) 24  Temp: 98.1 F (36.7 C)  SpO2: 95%  Weight: 150 lb 6.4 oz (68.2 kg)   Body mass index is 23.56 kg/m. Physical Exam Constitutional:      General: He is not in acute distress.    Appearance: He is not diaphoretic.  HENT:     Head: Normocephalic and atraumatic.     Mouth/Throat:     Mouth: Mucous membranes are dry.     Pharynx: Oropharynx is clear.  Neck:     Thyroid: No thyromegaly.     Vascular: No JVD.     Trachea: No tracheal deviation.  Cardiovascular:     Rate and Rhythm: Normal rate and regular rhythm.     Heart sounds: No murmur heard. Pulmonary:     Effort: Pulmonary effort is normal.     Breath sounds: Rales present. No wheezing.     Comments: Mild increased WOB Abdominal:     General: Bowel sounds are normal. There is no distension.     Palpations: Abdomen is soft.     Tenderness: There is no abdominal tenderness.  Musculoskeletal:     Comments: Trace left arm. Trace edema to ankles  Lymphadenopathy:     Cervical: No cervical adenopathy.  Skin:    General: Skin is warm and dry.  Neurological:     Mental Status: He is alert. Mental status is at baseline.     Comments: Left sided weakness     Labs reviewed: Recent Labs    12/18/20 1630 12/19/20 0610 12/19/20 1632 12/20/20 0153 02/08/21 0000 02/09/21 0000 08/10/21 5400 08/10/21 0957 08/11/21 0408 08/12/21 0107 08/16/21 0000 11/08/21 0000 11/15/21 0000 11/22/21 0000  NA  --  139  --    < > 150*   < > 141   < > 140 142   < > 140 137 140  K  --  3.5  --    < > 3.7   < > 3.8   < > 3.6 3.9   < > 3.8 4.1 3.8  CL  --  108  --    < > 107   < > 111  --  109 113*   < > 104 102 105  CO2  --  23  --    < > 22   < > 16*  --  24 23   < >  24* 25* 23*  GLUCOSE  --  186*  --    < >  --    < > 148*  --  120* 143*  --   --   --   --   BUN  --  16  --    < > 23*   < > 22  --  17 17   < > 22* 18 19  CREATININE  --  0.76  --    < > 0.9   < > 1.18  --  0.81 0.83   < > 0.8 0.7 0.8  CALCIUM  --  7.7*  --    < > 9.9   < > 8.8*  --  8.6* 8.7*   < > 9.4 9.6 9.1  MG 1.9 2.0 1.9   < > 2.1  --   --   --  1.9 2.0  --   --   --   --  PHOS 3.1 2.7 2.4*  --   --   --   --   --   --   --   --   --   --   --    < > = values in this interval not displayed.   Recent Labs    07/12/21 1056 08/10/21 0937 08/11/21 0408 09/07/21 0000 09/08/21 0000  AST '22 29 27 24 24  '$ ALT '22 26 23 18 18  '$ ALKPHOS  --  93 80 105 105  BILITOT 0.7 0.5 0.8  --   --   PROT 7.2 6.4* 5.8*  --   --   ALBUMIN  --  3.3* 3.1* 3.5 3.5   Recent Labs    07/12/21 1056 08/10/21 0937 08/10/21 0957 08/11/21 0408 08/12/21 0107 09/07/21 0000 10/31/21 0000 11/01/21 0000 11/08/21 0000  WBC 9.0 16.1*  --  9.1 10.2   < > 9.3 8.9 7.9  NEUTROABS 6,804 14.3*  --   --  7.8*  --   --   --   --   HGB 10.9* 10.0*   < > 9.4* 9.4*   < > 9.1* 9.4* 9.9*  HCT 33.9* 31.6*   < > 28.7* 29.8*   < > 27* 28* 30*  MCV 90.4 96.3  --  92.0 94.3  --   --   --   --   PLT 426* 238  --  218 227   < > 228 258 295   < > = values in this interval not displayed.   Lab Results  Component Value Date   TSH 1.773 01/24/2021   Lab Results  Component Value Date   HGBA1C 5.7 07/05/2021   Lab Results  Component Value Date   CHOL 110 07/05/2021   HDL 43 07/05/2021   LDLCALC 57 07/05/2021   TRIG 51 07/05/2021   CHOLHDL 2.8 08/08/2020    Significant Diagnostic Results in last 30 days:  No results found.  Assessment/Plan  1. Acute on chronic diastolic CHF (congestive heart failure) ([place]) Xray on 10/31/21 did indicated CHF along with mild weight gain He was given lasix 40 mg today with a large wet brief noted this afternoon. Still has mild increased WOB Given lasix 40 mg x 2 days with Kdur 20 x  2 days then return to previous dosing. He remains alert and I encouraged the staff to make sure he has water when possible as he tends to become hypernatremic easily. We have noticed that since he has been on ritalin he is more alert and able to drink more fluid but also has had issue with CHF.  He is going to f/u with cardiology next month.   2. Hypoxia Mild on 2 liters Can titrate tomorrow as tolerated for sat >90%    Family/ staff Communication: Spoke with Glenna Fellows ordered:  [facility] Monday 9/25

## 2021-11-26 NOTE — Telephone Encounter (Signed)
Nursing supervisor reported that [name] [name] is having some sob and did not lose weight after taking an extra dose of lasix. Sat was 87% and he was put on 2 liters of oxygen with resolution of symptoms. Order given to give one more dose of Lasix 40 mg and Kdur 20 meq. Continue to monitor weights and vitals.

## 2021-11-29 ENCOUNTER — Other Ambulatory Visit (HOSPITAL_BASED_OUTPATIENT_CLINIC_OR_DEPARTMENT_OTHER): Payer: Self-pay

## 2021-11-29 ENCOUNTER — Telehealth: Payer: Self-pay | Admitting: Adult Health

## 2021-11-29 DIAGNOSIS — I69354 Hemiplegia and hemiparesis following cerebral infarction affecting left non-dominant side: Secondary | ICD-10-CM | POA: Diagnosis not present

## 2021-11-29 DIAGNOSIS — M6389 Disorders of muscle in diseases classified elsewhere, multiple sites: Secondary | ICD-10-CM | POA: Diagnosis not present

## 2021-11-29 DIAGNOSIS — S065X3S Traumatic subdural hemorrhage with loss of consciousness of 1 hour to 5 hours 59 minutes, sequela: Secondary | ICD-10-CM | POA: Diagnosis not present

## 2021-11-29 DIAGNOSIS — G4089 Other seizures: Secondary | ICD-10-CM | POA: Diagnosis not present

## 2021-11-29 DIAGNOSIS — H538 Other visual disturbances: Secondary | ICD-10-CM | POA: Diagnosis not present

## 2021-11-29 DIAGNOSIS — R41840 Attention and concentration deficit: Secondary | ICD-10-CM | POA: Diagnosis not present

## 2021-11-29 DIAGNOSIS — I5022 Chronic systolic (congestive) heart failure: Secondary | ICD-10-CM | POA: Diagnosis not present

## 2021-11-29 DIAGNOSIS — I69391 Dysphagia following cerebral infarction: Secondary | ICD-10-CM | POA: Diagnosis not present

## 2021-11-29 DIAGNOSIS — I38 Endocarditis, valve unspecified: Secondary | ICD-10-CM | POA: Diagnosis not present

## 2021-11-29 DIAGNOSIS — R278 Other lack of coordination: Secondary | ICD-10-CM | POA: Diagnosis not present

## 2021-11-29 DIAGNOSIS — I69812 Visuospatial deficit and spatial neglect following other cerebrovascular disease: Secondary | ICD-10-CM | POA: Diagnosis not present

## 2021-11-29 DIAGNOSIS — R41841 Cognitive communication deficit: Secondary | ICD-10-CM | POA: Diagnosis not present

## 2021-11-29 DIAGNOSIS — R293 Abnormal posture: Secondary | ICD-10-CM | POA: Diagnosis not present

## 2021-11-29 LAB — BASIC METABOLIC PANEL
BUN: 31 — AB (ref 4–21)
CO2: 22 (ref 13–22)
Chloride: 106 (ref 99–108)
Creatinine: 0.9 (ref 0.6–1.3)
Glucose: 97
Potassium: 4.3 mEq/L (ref 3.5–5.1)
Sodium: 140 (ref 137–147)

## 2021-11-29 LAB — COMPREHENSIVE METABOLIC PANEL
Calcium: 9.3 (ref 8.7–10.7)
eGFR: 86

## 2021-11-29 MED ORDER — FUROSEMIDE 20 MG PO TABS: 40.0000 mg | ORAL_TABLET | Freq: Every day | ORAL | 0 refills | Status: DC

## 2021-11-29 NOTE — Telephone Encounter (Signed)
Pt weight is down 2 lbs after two days of Lasix 40 mg.  Discussed with Dr. Lyndel Safe. Will order alternating dose of Lasix 40 mg M W F and 20 mg other days due to weight gain and sob. NA 140. K 4.3 11/29/21.

## 2021-11-30 ENCOUNTER — Non-Acute Institutional Stay (SKILLED_NURSING_FACILITY): Payer: Medicare Other | Admitting: Internal Medicine

## 2021-11-30 ENCOUNTER — Encounter: Payer: Self-pay | Admitting: Internal Medicine

## 2021-11-30 DIAGNOSIS — R278 Other lack of coordination: Secondary | ICD-10-CM | POA: Diagnosis not present

## 2021-11-30 DIAGNOSIS — G4089 Other seizures: Secondary | ICD-10-CM | POA: Diagnosis not present

## 2021-11-30 DIAGNOSIS — I38 Endocarditis, valve unspecified: Secondary | ICD-10-CM

## 2021-11-30 DIAGNOSIS — I69391 Dysphagia following cerebral infarction: Secondary | ICD-10-CM | POA: Diagnosis not present

## 2021-11-30 DIAGNOSIS — I69354 Hemiplegia and hemiparesis following cerebral infarction affecting left non-dominant side: Secondary | ICD-10-CM

## 2021-11-30 DIAGNOSIS — K5901 Slow transit constipation: Secondary | ICD-10-CM | POA: Diagnosis not present

## 2021-11-30 DIAGNOSIS — T826XXD Infection and inflammatory reaction due to cardiac valve prosthesis, subsequent encounter: Secondary | ICD-10-CM | POA: Diagnosis not present

## 2021-11-30 DIAGNOSIS — M6389 Disorders of muscle in diseases classified elsewhere, multiple sites: Secondary | ICD-10-CM | POA: Diagnosis not present

## 2021-11-30 DIAGNOSIS — R0902 Hypoxemia: Secondary | ICD-10-CM

## 2021-11-30 DIAGNOSIS — I5033 Acute on chronic diastolic (congestive) heart failure: Secondary | ICD-10-CM

## 2021-11-30 DIAGNOSIS — G4733 Obstructive sleep apnea (adult) (pediatric): Secondary | ICD-10-CM

## 2021-11-30 DIAGNOSIS — S065X3S Traumatic subdural hemorrhage with loss of consciousness of 1 hour to 5 hours 59 minutes, sequela: Secondary | ICD-10-CM | POA: Diagnosis not present

## 2021-11-30 DIAGNOSIS — I5022 Chronic systolic (congestive) heart failure: Secondary | ICD-10-CM | POA: Diagnosis not present

## 2021-11-30 DIAGNOSIS — I480 Paroxysmal atrial fibrillation: Secondary | ICD-10-CM | POA: Diagnosis not present

## 2021-11-30 DIAGNOSIS — I69812 Visuospatial deficit and spatial neglect following other cerebrovascular disease: Secondary | ICD-10-CM | POA: Diagnosis not present

## 2021-11-30 DIAGNOSIS — D638 Anemia in other chronic diseases classified elsewhere: Secondary | ICD-10-CM

## 2021-11-30 DIAGNOSIS — R41841 Cognitive communication deficit: Secondary | ICD-10-CM | POA: Diagnosis not present

## 2021-11-30 DIAGNOSIS — R569 Unspecified convulsions: Secondary | ICD-10-CM | POA: Diagnosis not present

## 2021-11-30 DIAGNOSIS — N400 Enlarged prostate without lower urinary tract symptoms: Secondary | ICD-10-CM | POA: Diagnosis not present

## 2021-11-30 DIAGNOSIS — H538 Other visual disturbances: Secondary | ICD-10-CM | POA: Diagnosis not present

## 2021-11-30 DIAGNOSIS — I10 Essential (primary) hypertension: Secondary | ICD-10-CM | POA: Diagnosis not present

## 2021-11-30 DIAGNOSIS — R293 Abnormal posture: Secondary | ICD-10-CM | POA: Diagnosis not present

## 2021-11-30 DIAGNOSIS — R41840 Attention and concentration deficit: Secondary | ICD-10-CM | POA: Diagnosis not present

## 2021-11-30 NOTE — Progress Notes (Signed)
Location:  Occupational psychologist of Service:  SNF (31)  Provider:   Code Status: DNR Goals of Care:     09/13/2021   10:08 AM  Advanced Directives  Does Patient Have a Medical Advance Directive? Yes  Type of Paramedic of [place];Living will;Out of facility DNR (pink MOST or yellow form)  Does patient want to make changes to medical advance directive? No - Patient declined  Copy of [place] in Chart? Yes - validated most recent copy scanned in chart (See row information)     Chief Complaint  Patient presents with   Chronic Care Management    HPI: Patient is a 81 y.o. male seen today for medical management of chronic diseases.    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  We have been using Lasix as needed His weight was up again today and wife was concerned and wanted to make sure he got his 40 mg of Lasix.  Patient unable to give much history OT in the room he does get little short of breath.  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    Other and unspecified hyperlipidemia    Pre-diabetes    Previous back surgery 1978, jan 2007   Psychosexual dysfunction with inhibited sexual excitement    SOB (shortness of breath)    "with heavy exercise"   Stroke ([place]) 08/2013   "I WAS IN AFIB AND THREW A CLOT, THE EFFECTS WERE TRANSITORY AND DIDNT LAST BUT FOR 30 MINUTES"    Thoracic aortic aneurysm [facility])     Past Surgical History:  Procedure Laterality Date   AORTIC AND MITRAL VALVE REPLACEMENT     09/2004   CARDIOVERSION     3 times from 2004-2006   CARDIOVERSION N/A 09/26/2013   Procedure: CARDIOVERSION;  Surgeon: Larey Dresser, MD;  Location: [facility];  Service: Cardiovascular;  Laterality: N/A;   CARDIOVERSION N/A 06/19/2014   Procedure: CARDIOVERSION;  Surgeon: Jerline Pain, MD;  Location: [facility] ENDOSCOPY;  Service: Cardiovascular;  Laterality: N/A;   CARDIOVERSION N/A 04/24/2017   Procedure: CARDIOVERSION;  Surgeon: Larey Dresser, MD;  Location: [facility] ENDOSCOPY;  Service: Cardiovascular;  Laterality: N/A;   CARDIOVERSION N/A 06/08/2017   Procedure: CARDIOVERSION;  Surgeon: Pixie Casino, MD;  Location: [facility] ENDOSCOPY;  Service: Cardiovascular;  Laterality: N/A;   CARDIOVERSION N/A 08/24/2017   Procedure:  CARDIOVERSION;  Surgeon: Skeet Latch, MD;  Location: [facility] ENDOSCOPY;  Service: Cardiovascular;  Laterality: N/A;   CARDIOVERSION N/A 02/14/2018   Procedure: CARDIOVERSION;  Surgeon: Larey Dresser, MD;  Location: [facility] ENDOSCOPY;  Service: Cardiovascular;  Laterality: N/A;   COLONOSCOPY     CRANIOTOMY N/A 07/28/2020   Procedure: CRANIOTOMY FOR EVACUATION OF SUBDURAL HEMATOMA;  Surgeon: Eustace Monie, MD;  Location: [place];  Service: Neurosurgery;  Laterality: N/A;   IR ANGIO VERTEBRAL SEL SUBCLAVIAN INNOMINATE UNI R MOD SED  08/07/2020   IR CT HEAD LTD  08/07/2020   IR PERCUTANEOUS ART THROMBECTOMY/INFUSION INTRACRANIAL INC DIAG ANGIO  08/07/2020   laminectomies      10/1975 and in 03/2005   RADIOLOGY WITH ANESTHESIA N/A 08/07/2020   Procedure: IR WITH ANESTHESIA;  Surgeon: Luanne Bras, MD;  Location: [place];  Service: Radiology;  Laterality: N/A;   TEE WITHOUT CARDIOVERSION N/A 12/18/2020   Procedure: TRANSESOPHAGEAL ECHOCARDIOGRAM (TEE);  Surgeon: Larey Dresser, MD;  Location: [facility] ENDOSCOPY;  Service: Cardiovascular;  Laterality: N/A;   [place] Right 04/03/2018   Procedure: TOTAL HIP ARTHROPLASTY ANTERIOR APPROACH;  Surgeon: Paralee Cancel, MD;  Location: [facility] ORS;  Service: Orthopedics;  Laterality: Right;  70 mins    Allergies  Allergen Reactions   Naproxen Hives   No [facility] Food Allergies Other (See Comments)    Scallops - distress, nausea and vomitting    Outpatient Encounter Medications as of 11/30/2021  Medication Sig   acetaminophen (TYLENOL) 325 MG tablet Take 650 mg by mouth as needed for mild pain.   Amantadine HCl 100 MG tablet Take 100 mg by mouth 2 (two) times daily.   amLODipine (NORVASC) 5 MG tablet Take 5 mg by mouth daily.   amoxicillin (AMOXIL) 500 MG capsule Take 500 mg by mouth 2 (two) times daily.  *NO STOP DATE* - per pharmacy can open capsule -Do not crush   apixaban (ELIQUIS) 5 MG TABS tablet Take 1 tablet (5 mg total) by mouth 2 (two) times daily.   Artificial Saliva (BIOTENE MOISTURIZING MOUTH MT) Use as directed 2 sprays in the mouth or throat in the morning, at noon, and at bedtime.   atorvastatin (LIPITOR) 80 MG tablet Take 1 tablet (80 mg total) by mouth daily.   bisacodyl (DULCOLAX) 10 MG suppository Place 1 suppository (10 mg total) rectally daily as needed for moderate constipation or mild constipation.   clobetasol (TEMOVATE) 0.05 % external solution Apply 1 application  topically as needed (itching).   diazePAM, 15 MG Dose, (VALTOCO 15 MG DOSE) 2 x 7.5 MG/0.1ML LQPK Place 7.5 mg into each nostril (total of 15 mg) for grand mal seizure lasting over 5 minutes. Can repeat  once in 4 hours. Do Not use more than twice in 24 hours   dofetilide (TIKOSYN) 125 MCG capsule Take 3 capsules (375 mcg total) by mouth 2 (two) times daily.   famotidine (PEPCID) 20 MG tablet Take 20 mg by mouth at bedtime as needed for indigestion.   finasteride (PROSCAR) 5 MG tablet Take 1 tablet (5 mg total) by mouth daily.   furosemide (LASIX) 20 MG tablet Take 2 tablets (40 mg total) by mouth daily. 40 mg M W F and 20 mg Tues Thursday Saturday and Sunday (Patient taking differently: Take 40 mg by mouth daily. 40 mg M W F and Sun and 20 mg Tues Thursday Saturday)   Glucerna (GLUCERNA) LIQD Take 237 mLs by mouth  daily.   hydrocortisone 2.5 % cream Apply 1 application. topically daily as needed (itching).   lacosamide (VIMPAT) 50 MG TABS tablet Take 1 tablet (50 mg total) by mouth 2 (two) times daily.   levalbuterol (XOPENEX) 0.63 MG/3ML nebulizer solution Take 0.63 mg by nebulization every 6 (six) hours as needed for wheezing or shortness of breath.   magnesium gluconate (MAGONATE) 500 MG tablet Take 1 tablet (500 mg total) by mouth daily.   methenamine (MANDELAMINE) 1 g tablet Take 1,000 mg by mouth 2 (two) times daily.   methylphenidate (RITALIN) 5 MG tablet Use at 7 AM  and again at 2 PM po   Multiple Vitamin (MULTIVITAMIN WITH MINERALS) TABS tablet Take 1 tablet by mouth daily.   omeprazole (PRILOSEC) 20 MG capsule Take 20 mg by mouth daily.   Polyethyl Glycol-Propyl Glycol (SYSTANE) 0.4-0.3 % SOLN Place 2 drops into both eyes daily.   polyethylene glycol (MIRALAX / GLYCOLAX) 17 g packet Take 17 g by mouth every other day.   potassium chloride (KLOR-CON) 10 MEQ tablet Take 20 mEq by mouth daily. X 2 days then return to 10 meq   senna-docusate (SENOKOT-S) 8.6-50 MG tablet Take 1 tablet by mouth at bedtime as needed.   No facility-administered encounter medications on file as of 11/30/2021.    Review of Systems:  Review of Systems  Unable to perform ROS: Other    Health Maintenance   Topic Date Due   Diabetic kidney evaluation - Urine ACR  Never done   COVID-19 Vaccine (4 - Moderna risk series) 03/09/2021   INFLUENZA VACCINE  10/05/2021   OPHTHALMOLOGY EXAM  10/06/2022 (Originally 10/04/1950)   HEMOGLOBIN A1C  01/05/2022   FOOT EXAM  10/23/2022   Diabetic kidney evaluation - GFR measurement  11/23/2022   TETANUS/TDAP  03/22/2028   Pneumonia Vaccine 51+ Years old  Completed   Zoster Vaccines- Shingrix  Completed   HPV VACCINES  Aged Out   COLONOSCOPY (Pts 45-31yr Insurance coverage will need to be confirmed)  Discontinued   Hepatitis C Screening  Discontinued    Physical Exam: Vitals:   11/30/21 1551  BP: 111/74  Pulse: 75  Resp: (!) 24  Temp: 97.8 F (36.6 C)  Weight: 150 lb 6.4 oz (68.2 kg)   Body mass index is 23.56 kg/m. Physical Exam Vitals reviewed.  Constitutional:      Appearance: Normal appearance.  HENT:     Head: Normocephalic.     Nose: Nose normal.     Mouth/Throat:     Mouth: Mucous membranes are moist.     Pharynx: Oropharynx is clear.  Eyes:     Pupils: Pupils are equal, round, and reactive to light.  Cardiovascular:     Rate and Rhythm: Normal rate and regular rhythm.     Pulses: Normal pulses.     Heart sounds: Murmur heard.  Pulmonary:     Effort: Pulmonary effort is normal. No respiratory distress.     Breath sounds: Rales present.  Abdominal:     General: Abdomen is flat. Bowel sounds are normal.     Palpations: Abdomen is soft.  Musculoskeletal:        General: Swelling present.     Cervical back: Neck supple.  Skin:    General: Skin is warm.  Neurological:     Mental Status: He is alert.  Psychiatric:        Mood and Affect: Mood normal.        Thought Content: Thought content  normal.     Labs reviewed: Basic Metabolic Panel: Recent Labs    12/14/20 2056 12/15/20 0508 12/18/20 1630 12/19/20 0610 12/19/20 1632 12/20/20 0153 01/24/21 0157 01/25/21 7939 02/08/21 0000 02/09/21 0000 08/10/21 0300  08/10/21 0957 08/11/21 0408 08/12/21 0107 08/16/21 0000 11/08/21 0000 11/15/21 0000 11/22/21 0000  NA  --    < >  --  139  --    < >  --    < > 150*   < > 141   < > 140 142   < > 140 137 140  K  --    < >  --  3.5  --    < >  --    < > 3.7   < > 3.8   < > 3.6 3.9   < > 3.8 4.1 3.8  CL  --    < >  --  108  --    < >  --    < > 107   < > 111  --  109 113*   < > 104 102 105  CO2  --    < >  --  23  --    < >  --    < > 22   < > 16*  --  24 23   < > 24* 25* 23*  GLUCOSE  --    < >  --  186*  --    < >  --    < >  --    < > 148*  --  120* 143*  --   --   --   --   BUN  --    < >  --  16  --    < >  --    < > 23*   < > 22  --  17 17   < > 22* 18 19  CREATININE  --    < >  --  0.76  --    < >  --    < > 0.9   < > 1.18  --  0.81 0.83   < > 0.8 0.7 0.8  CALCIUM  --    < >  --  7.7*  --    < >  --    < > 9.9   < > 8.8*  --  8.6* 8.7*   < > 9.4 9.6 9.1  MG 2.3   < > 1.9 2.0 1.9   < > 1.9   < > 2.1  --   --   --  1.9 2.0  --   --   --   --   PHOS  --    < > 3.1 2.7 2.4*  --   --   --   --   --   --   --   --   --   --   --   --   --   TSH 1.567  --   --   --   --   --  1.773  --   --   --   --   --   --   --   --   --   --   --    < > = values in this interval not displayed.   Liver Function Tests: Recent Labs    07/12/21 1056 08/10/21 0937 08/11/21 0408 09/07/21 0000 09/08/21 0000  AST  $'22 29 27 24 24  's$ ALT '22 26 23 18 18  '$ ALKPHOS  --  93 80 105 105  BILITOT 0.7 0.5 0.8  --   --   PROT 7.2 6.4* 5.8*  --   --   ALBUMIN  --  3.3* 3.1* 3.5 3.5   No results for input(s): "LIPASE", "AMYLASE" in the last 8760 hours. Recent Labs    12/14/20 1639 01/24/21 0157  AMMONIA 25 22   CBC: Recent Labs    07/12/21 1056 08/10/21 0937 08/10/21 0957 08/11/21 0408 08/12/21 0107 09/07/21 0000 10/31/21 0000 11/01/21 0000 11/08/21 0000  WBC 9.0 16.1*  --  9.1 10.2   < > 9.3 8.9 7.9  NEUTROABS 6,804 14.3*  --   --  7.8*  --   --   --   --   HGB 10.9* 10.0*   < > 9.4* 9.4*   < > 9.1* 9.4* 9.9*   HCT 33.9* 31.6*   < > 28.7* 29.8*   < > 27* 28* 30*  MCV 90.4 96.3  --  92.0 94.3  --   --   --   --   PLT 426* 238  --  218 227   < > 228 258 295   < > = values in this interval not displayed.   Lipid Panel: Recent Labs    07/05/21 0000  CHOL 110  HDL 43  LDLCALC 57  TRIG 51   Lab Results  Component Value Date   HGBA1C 5.7 07/05/2021    Procedures since last visit: No results found.  Assessment/Plan 1. Acute on chronic diastolic CHF (congestive heart failure) (HCC) Give extra Lasix today and Increase the dose to 40 mg 4/week If his weight does not change will increase it to every day Trying to be cautious due to his h/o Getting Dehydrated  He gets BMP every week per family request to follow hypernatremia  2. Hypernatremia Follows sodium and has been stable for now   3. Seizure ([place]) On Vimpat  4. Anemia of chronic disease Repeat CBC   5. 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 Slow transit constipation Miralax prn  8. BPH without urinary obstruction Proscar Gemtesa  and Hiprex  9. Obstructive sleep apnea syndrome On BIPAP  10. PAF (paroxysmal atrial fibrillation) (HCC) On Eliquis and Tikosyn  11. Prosthetic valve endocarditis On Amoxicillin Indefinite  12. Essential hypertension Norvasc 13 HLD   Labs/tests ordered:  * No order type specified * Next appt:  Visit date not found

## 2021-12-01 ENCOUNTER — Encounter: Payer: Self-pay | Admitting: Internal Medicine

## 2021-12-01 DIAGNOSIS — H538 Other visual disturbances: Secondary | ICD-10-CM | POA: Diagnosis not present

## 2021-12-01 DIAGNOSIS — R41840 Attention and concentration deficit: Secondary | ICD-10-CM | POA: Diagnosis not present

## 2021-12-01 DIAGNOSIS — G4089 Other seizures: Secondary | ICD-10-CM | POA: Diagnosis not present

## 2021-12-01 DIAGNOSIS — I5022 Chronic systolic (congestive) heart failure: Secondary | ICD-10-CM | POA: Diagnosis not present

## 2021-12-01 DIAGNOSIS — R278 Other lack of coordination: Secondary | ICD-10-CM | POA: Diagnosis not present

## 2021-12-01 DIAGNOSIS — I69391 Dysphagia following cerebral infarction: Secondary | ICD-10-CM | POA: Diagnosis not present

## 2021-12-01 DIAGNOSIS — I69812 Visuospatial deficit and spatial neglect following other cerebrovascular disease: Secondary | ICD-10-CM | POA: Diagnosis not present

## 2021-12-01 DIAGNOSIS — I69354 Hemiplegia and hemiparesis following cerebral infarction affecting left non-dominant side: Secondary | ICD-10-CM | POA: Diagnosis not present

## 2021-12-01 DIAGNOSIS — R41841 Cognitive communication deficit: Secondary | ICD-10-CM | POA: Diagnosis not present

## 2021-12-01 DIAGNOSIS — M6389 Disorders of muscle in diseases classified elsewhere, multiple sites: Secondary | ICD-10-CM | POA: Diagnosis not present

## 2021-12-01 DIAGNOSIS — R293 Abnormal posture: Secondary | ICD-10-CM | POA: Diagnosis not present

## 2021-12-01 DIAGNOSIS — S065X3S Traumatic subdural hemorrhage with loss of consciousness of 1 hour to 5 hours 59 minutes, sequela: Secondary | ICD-10-CM | POA: Diagnosis not present

## 2021-12-02 DIAGNOSIS — R41841 Cognitive communication deficit: Secondary | ICD-10-CM | POA: Diagnosis not present

## 2021-12-02 DIAGNOSIS — I5022 Chronic systolic (congestive) heart failure: Secondary | ICD-10-CM | POA: Diagnosis not present

## 2021-12-02 DIAGNOSIS — I69354 Hemiplegia and hemiparesis following cerebral infarction affecting left non-dominant side: Secondary | ICD-10-CM | POA: Diagnosis not present

## 2021-12-02 DIAGNOSIS — S065X3S Traumatic subdural hemorrhage with loss of consciousness of 1 hour to 5 hours 59 minutes, sequela: Secondary | ICD-10-CM | POA: Diagnosis not present

## 2021-12-02 DIAGNOSIS — G4089 Other seizures: Secondary | ICD-10-CM | POA: Diagnosis not present

## 2021-12-02 DIAGNOSIS — R293 Abnormal posture: Secondary | ICD-10-CM | POA: Diagnosis not present

## 2021-12-02 DIAGNOSIS — R278 Other lack of coordination: Secondary | ICD-10-CM | POA: Diagnosis not present

## 2021-12-02 DIAGNOSIS — R41840 Attention and concentration deficit: Secondary | ICD-10-CM | POA: Diagnosis not present

## 2021-12-02 DIAGNOSIS — M6389 Disorders of muscle in diseases classified elsewhere, multiple sites: Secondary | ICD-10-CM | POA: Diagnosis not present

## 2021-12-02 DIAGNOSIS — I69391 Dysphagia following cerebral infarction: Secondary | ICD-10-CM | POA: Diagnosis not present

## 2021-12-02 DIAGNOSIS — I69812 Visuospatial deficit and spatial neglect following other cerebrovascular disease: Secondary | ICD-10-CM | POA: Diagnosis not present

## 2021-12-02 DIAGNOSIS — H538 Other visual disturbances: Secondary | ICD-10-CM | POA: Diagnosis not present

## 2021-12-03 ENCOUNTER — Non-Acute Institutional Stay (SKILLED_NURSING_FACILITY): Payer: Medicare Other | Admitting: Adult Health

## 2021-12-03 ENCOUNTER — Encounter: Payer: Self-pay | Admitting: Adult Health

## 2021-12-03 DIAGNOSIS — G4089 Other seizures: Secondary | ICD-10-CM | POA: Diagnosis not present

## 2021-12-03 DIAGNOSIS — R278 Other lack of coordination: Secondary | ICD-10-CM | POA: Diagnosis not present

## 2021-12-03 DIAGNOSIS — I5033 Acute on chronic diastolic (congestive) heart failure: Secondary | ICD-10-CM

## 2021-12-03 DIAGNOSIS — I69391 Dysphagia following cerebral infarction: Secondary | ICD-10-CM | POA: Diagnosis not present

## 2021-12-03 DIAGNOSIS — R41840 Attention and concentration deficit: Secondary | ICD-10-CM | POA: Diagnosis not present

## 2021-12-03 DIAGNOSIS — R062 Wheezing: Secondary | ICD-10-CM | POA: Diagnosis not present

## 2021-12-03 DIAGNOSIS — I5022 Chronic systolic (congestive) heart failure: Secondary | ICD-10-CM | POA: Diagnosis not present

## 2021-12-03 DIAGNOSIS — R41841 Cognitive communication deficit: Secondary | ICD-10-CM | POA: Diagnosis not present

## 2021-12-03 DIAGNOSIS — I69812 Visuospatial deficit and spatial neglect following other cerebrovascular disease: Secondary | ICD-10-CM | POA: Diagnosis not present

## 2021-12-03 DIAGNOSIS — R293 Abnormal posture: Secondary | ICD-10-CM | POA: Diagnosis not present

## 2021-12-03 DIAGNOSIS — S065X3S Traumatic subdural hemorrhage with loss of consciousness of 1 hour to 5 hours 59 minutes, sequela: Secondary | ICD-10-CM | POA: Diagnosis not present

## 2021-12-03 DIAGNOSIS — I69354 Hemiplegia and hemiparesis following cerebral infarction affecting left non-dominant side: Secondary | ICD-10-CM | POA: Diagnosis not present

## 2021-12-03 DIAGNOSIS — H538 Other visual disturbances: Secondary | ICD-10-CM | POA: Diagnosis not present

## 2021-12-03 DIAGNOSIS — M6389 Disorders of muscle in diseases classified elsewhere, multiple sites: Secondary | ICD-10-CM | POA: Diagnosis not present

## 2021-12-03 MED ORDER — TORSEMIDE 20 MG PO TABS: 20.0000 mg | ORAL_TABLET | Freq: Every day | ORAL | 0 refills | Status: DC

## 2021-12-03 NOTE — Progress Notes (Signed)
Location:  Occupational psychologist of Service:  SNF (31) Provider:   Cindi Carbon, [place] [phone]   [name], MD  Patient Care Team: [name], MD as PCP - General (Internal Medicine) Larey Dresser, MD as PCP - Cardiology (Cardiology) [name], MD as Consulting Physician (Ophthalmology) [name], MD (Internal Medicine)  Extended Emergency Contact Information Primary Emergency Contact: [name] Address: [id number]          [place], [place] [address] [place] Phone: [phone] Mobile Phone: [phone] Relation: Spouse Secondary Emergency Contact: [name], [place] [address] [name] of [place] Phone: [phone] Relation: Son  Code Status:  DNR Goals of care: Advanced Directive information    09/13/2021   10:08 AM  Advanced Directives  Does Patient Have a Medical Advance Directive? Yes  Type of Paramedic of [place];Living will;Out of facility DNR (pink MOST or yellow form)  Does patient want to make changes to medical advance directive? No - Patient declined  Copy of [place] in Chart? Yes - validated most recent copy scanned in chart (See row information)     Chief Complaint  Patient presents with   Acute Visit    sob    HPI:  Pt is a 81 y.o. male seen today for an acute visit for weight gain and low oxygen sat with increased RR>  PMH includes syncope with fall in May of 2022, TBI with SDH with craniotomy, acute right MCA s/p thrombectomy, bioprosthetic mitral and aortic valve replacement, DM II, diastiolic CHF, BPH, and afib.  On life long amoxicillin for endocarditis  Echo 03/31/21 EF 45-50% For the past month he has been on escalating dosing of lasix due to weight gain and sob. . We have tried to be judicious due to his hx of hypernatremia. He has been on lasix 40 mg but appears  sob today and he was able to endorse this feeling with hand cues. He is requiring oxygen again 2 liters sats were in the 80s without oxygen. He is using cpap at night. He is eating and drinking well.    EKG was done 11/26/21 which showed normal sinus rhythm with first degree AV block. Did have PVC.   He has not seemed like himself per his wife. I asked him to use fingers to communicate. One finger for yes and 2 fingers for now. He said no to sob or pain. He chose one finger when asked if he loved his wife.   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    Other and unspecified hyperlipidemia    Pre-diabetes    Previous back surgery 1978, jan 2007   Psychosexual dysfunction with inhibited sexual excitement  SOB (shortness of breath)    "with heavy exercise"   Stroke ([place]) 08/2013   "I WAS IN AFIB AND THREW A CLOT, THE EFFECTS WERE TRANSITORY AND DIDNT LAST BUT FOR 30 MINUTES"    Thoracic aortic aneurysm [facility])    Past Surgical History:  Procedure Laterality Date   AORTIC AND MITRAL VALVE REPLACEMENT     09/2004   CARDIOVERSION     3 times from 2004-2006   CARDIOVERSION N/A 09/26/2013   Procedure: CARDIOVERSION;  Surgeon: Larey Dresser, MD;  Location: [place];  Service: Cardiovascular;  Laterality: N/A;   CARDIOVERSION N/A 06/19/2014   Procedure: CARDIOVERSION;  Surgeon: Jerline Pain, MD;  Location: [place];  Service: Cardiovascular;  Laterality: N/A;    CARDIOVERSION N/A 04/24/2017   Procedure: CARDIOVERSION;  Surgeon: Larey Dresser, MD;  Location: [place];  Service: Cardiovascular;  Laterality: N/A;   CARDIOVERSION N/A 06/08/2017   Procedure: CARDIOVERSION;  Surgeon: Pixie Casino, MD;  Location: [facility] ENDOSCOPY;  Service: Cardiovascular;  Laterality: N/A;   CARDIOVERSION N/A 08/24/2017   Procedure: CARDIOVERSION;  Surgeon: Skeet Latch, MD;  Location: [facility] ENDOSCOPY;  Service: Cardiovascular;  Laterality: N/A;   CARDIOVERSION N/A 02/14/2018   Procedure: CARDIOVERSION;  Surgeon: Larey Dresser, MD;  Location: [facility] ENDOSCOPY;  Service: Cardiovascular;  Laterality: N/A;   COLONOSCOPY     CRANIOTOMY N/A 07/28/2020   Procedure: CRANIOTOMY FOR EVACUATION OF SUBDURAL HEMATOMA;  Surgeon: Eustace Troung, MD;  Location: [place];  Service: Neurosurgery;  Laterality: N/A;   IR ANGIO VERTEBRAL SEL SUBCLAVIAN INNOMINATE UNI R MOD SED  08/07/2020   IR CT HEAD LTD  08/07/2020   IR PERCUTANEOUS ART THROMBECTOMY/INFUSION INTRACRANIAL INC DIAG ANGIO  08/07/2020   laminectomies     10/1975 and in 03/2005   RADIOLOGY WITH ANESTHESIA N/A 08/07/2020   Procedure: IR WITH ANESTHESIA;  Surgeon: Luanne Bras, MD;  Location: [place];  Service: Radiology;  Laterality: N/A;   TEE WITHOUT CARDIOVERSION N/A 12/18/2020   Procedure: TRANSESOPHAGEAL ECHOCARDIOGRAM (TEE);  Surgeon: Larey Dresser, MD;  Location: [facility] ENDOSCOPY;  Service: Cardiovascular;  Laterality: N/A;   [place] Right 04/03/2018   Procedure: TOTAL HIP ARTHROPLASTY ANTERIOR APPROACH;  Surgeon: Paralee Cancel, MD;  Location: [facility];  Service: Orthopedics;  Laterality: Right;  70 mins    Allergies  Allergen Reactions   Naproxen Hives   No [facility] Food Allergies Other (See Comments)    Scallops - distress, nausea and vomitting    Outpatient Encounter Medications as of 12/03/2021  Medication Sig   torsemide (DEMADEX) 20 MG tablet Take 1 tablet (20 mg total) by  mouth daily.   acetaminophen (TYLENOL) 325 MG tablet Take 650 mg by mouth as needed for mild pain.   Amantadine HCl 100 MG tablet Take 100 mg by mouth 2 (two) times daily.   amLODipine (NORVASC) 5 MG tablet Take 5 mg by mouth daily.   amoxicillin (AMOXIL) 500 MG capsule Take 500 mg by mouth 2 (two) times daily.  *NO STOP DATE* - per pharmacy can open capsule -Do not crush   apixaban (ELIQUIS) 5 MG TABS tablet Take 1 tablet (5 mg total) by mouth 2 (two) times daily.   Artificial Saliva (BIOTENE MOISTURIZING MOUTH MT) Use as directed 2 sprays in the mouth or throat in the morning, at noon, and at bedtime.   atorvastatin (LIPITOR) 80 MG tablet Take 1 tablet (80 mg total) by mouth daily.  bisacodyl (DULCOLAX) 10 MG suppository Place 1 suppository (10 mg total) rectally daily as needed for moderate constipation or mild constipation.   clobetasol (TEMOVATE) 0.05 % external solution Apply 1 application  topically as needed (itching).   diazePAM, 15 MG Dose, (VALTOCO 15 MG DOSE) 2 x 7.5 MG/0.1ML LQPK Place 7.5 mg into each nostril (total of 15 mg) for grand mal seizure lasting over 5 minutes. Can repeat once in 4 hours. Do Not use more than twice in 24 hours   dofetilide (TIKOSYN) 125 MCG capsule Take 3 capsules (375 mcg total) by mouth 2 (two) times daily.   famotidine (PEPCID) 20 MG tablet Take 20 mg by mouth at bedtime as needed for indigestion.   finasteride (PROSCAR) 5 MG tablet Take 1 tablet (5 mg total) by mouth daily.   Glucerna (GLUCERNA) LIQD Take 237 mLs by mouth daily.   hydrocortisone 2.5 % cream Apply 1 application. topically daily as needed (itching).   lacosamide (VIMPAT) 50 MG TABS tablet Take 1 tablet (50 mg total) by mouth 2 (two) times daily.   levalbuterol (XOPENEX) 0.63 MG/3ML nebulizer solution Take 0.63 mg by nebulization every 6 (six) hours as needed for wheezing or shortness of breath.   magnesium gluconate (MAGONATE) 500 MG tablet Take 1 tablet (500 mg total) by mouth daily.    methenamine (MANDELAMINE) 1 g tablet Take 1,000 mg by mouth 2 (two) times daily.   methylphenidate (RITALIN) 5 MG tablet Use at 7 AM  and again at 2 PM po   Multiple Vitamin (MULTIVITAMIN WITH MINERALS) TABS tablet Take 1 tablet by mouth daily.   omeprazole (PRILOSEC) 20 MG capsule Take 20 mg by mouth daily.   Polyethyl Glycol-Propyl Glycol (SYSTANE) 0.4-0.3 % SOLN Place 2 drops into both eyes daily.   polyethylene glycol (MIRALAX / GLYCOLAX) 17 g packet Take 17 g by mouth every other day.   potassium chloride (KLOR-CON) 10 MEQ tablet Take 20 mEq by mouth daily. X 2 days then return to 10 meq   senna-docusate (SENOKOT-S) 8.6-50 MG tablet Take 1 tablet by mouth at bedtime as needed.   [DISCONTINUED] furosemide (LASIX) 20 MG tablet Take 2 tablets (40 mg total) by mouth daily. 40 mg M W F and 20 mg Tues Thursday Saturday and Sunday (Patient taking differently: Take 40 mg by mouth daily. 40 mg M W F and Sun and 20 mg Tues Thursday Saturday)   No facility-administered encounter medications on file as of 12/03/2021.    Review of Systems  Unable to perform ROS: Patient nonverbal    Immunization History  Administered Date(s) Administered   Fluad Quad(high Dose 65+) 12/06/2018, 12/09/2020   Influenza Whole 12/25/2008, 01/05/2010, 12/30/2011   Influenza, High Dose Seasonal PF 12/14/2016   Influenza,inj,Quad PF,6+ Mos 12/19/2013   Influenza,inj,quad, With Preservative 11/22/2017   Influenza-Unspecified 12/14/2015, 12/24/2019   Moderna Covid-19 Vaccine Bivalent Booster 34yr & up 01/12/2021   Moderna SARS-COV2 Booster Vaccination 01/02/2020, 06/26/2020   Moderna Sars-Covid-2 Vaccination 04/08/2019, 05/05/2019   Pneumococcal Conjugate-13 12/19/2013   Pneumococcal Polysaccharide-23 09/24/2007, 05/22/2017   Td 12/25/2008   Tdap 03/22/2018   Zoster Recombinat (Shingrix) 07/16/2019, 10/28/2019   Zoster, Live 09/24/2007   Pertinent  Health Maintenance Due  Topic Date Due   INFLUENZA VACCINE   10/05/2021   OPHTHALMOLOGY EXAM  10/06/2022 (Originally 10/04/1950)   HEMOGLOBIN A1C  01/05/2022   FOOT EXAM  10/23/2022   COLONOSCOPY (Pts 45-438yrInsurance coverage will need to be confirmed)  Discontinued      08/10/2021  11:10 PM 08/11/2021    8:00 AM 08/11/2021    7:30 PM 08/12/2021    8:00 AM 10/13/2021    2:32 PM  Fall Risk  Falls in the past year?     0  Patient Fall Risk Level High fall risk High fall risk High fall risk High fall risk    Functional Status Survey:    Vitals:   12/03/21 1142  BP: 122/72  Pulse: 82  Resp: (!) 24  Temp: 97.7 F (36.5 C)  SpO2: 94%  Weight: 150 lb (68 kg)   Body mass index is 23.49 kg/m. Physical Exam Constitutional:      General: He is not in acute distress.    Appearance: He is not diaphoretic.     Comments: Eyes closed, arouses easily  HENT:     Head: Normocephalic and atraumatic.     Mouth/Throat:     Mouth: Mucous membranes are moist.     Pharynx: Oropharynx is clear.  Neck:     Thyroid: No thyromegaly.     Vascular: No JVD.     Trachea: No tracheal deviation.  Cardiovascular:     Rate and Rhythm: Normal rate and regular rhythm.     Heart sounds: No murmur heard. Pulmonary:     Breath sounds: Rales (bilateral with decreased left base) present. No wheezing.     Comments: Mild increased WOB Abdominal:     General: Bowel sounds are normal. There is no distension.     Palpations: Abdomen is soft.     Tenderness: There is no abdominal tenderness.  Musculoskeletal:     Comments: Trace left arm. Trace edema to ankles  Lymphadenopathy:     Cervical: No cervical adenopathy.  Skin:    General: Skin is warm and dry.  Neurological:     Mental Status: Mental status is at baseline.     Comments: Left sided weakness     Labs reviewed: Recent Labs    12/18/20 1630 12/19/20 0610 12/19/20 1632 12/20/20 0153 02/08/21 0000 02/09/21 0000 08/10/21 9518 08/10/21 0957 08/11/21 0408 08/12/21 0107 08/16/21 0000 11/15/21 0000  11/22/21 0000 11/29/21 0000  NA  --  139  --    < > 150*   < > 141   < > 140 142   < > 137 140 140  K  --  3.5  --    < > 3.7   < > 3.8   < > 3.6 3.9   < > 4.1 3.8 4.3  CL  --  108  --    < > 107   < > 111  --  109 113*   < > 102 105 106  CO2  --  23  --    < > 22   < > 16*  --  24 23   < > 25* 23* 22  GLUCOSE  --  186*  --    < >  --    < > 148*  --  120* 143*  --   --   --   --   BUN  --  16  --    < > 23*   < > 22  --  17 17   < > 18 19 31*  CREATININE  --  0.76  --    < > 0.9   < > 1.18  --  0.81 0.83   < > 0.7 0.8 0.9  CALCIUM  --  7.7*  --    < >  9.9   < > 8.8*  --  8.6* 8.7*   < > 9.6 9.1 9.3  MG 1.9 2.0 1.9   < > 2.1  --   --   --  1.9 2.0  --   --   --   --   PHOS 3.1 2.7 2.4*  --   --   --   --   --   --   --   --   --   --   --    < > = values in this interval not displayed.    Recent Labs    07/12/21 1056 08/10/21 0937 08/11/21 0408 09/07/21 0000 09/08/21 0000  AST '22 29 27 24 24  '$ ALT '22 26 23 18 18  '$ ALKPHOS  --  93 80 105 105  BILITOT 0.7 0.5 0.8  --   --   PROT 7.2 6.4* 5.8*  --   --   ALBUMIN  --  3.3* 3.1* 3.5 3.5    Recent Labs    07/12/21 1056 08/10/21 0937 08/10/21 0957 08/11/21 0408 08/12/21 0107 09/07/21 0000 10/31/21 0000 11/01/21 0000 11/08/21 0000  WBC 9.0 16.1*  --  9.1 10.2   < > 9.3 8.9 7.9  NEUTROABS 6,804 14.3*  --   --  7.8*  --   --   --   --   HGB 10.9* 10.0*   < > 9.4* 9.4*   < > 9.1* 9.4* 9.9*  HCT 33.9* 31.6*   < > 28.7* 29.8*   < > 27* 28* 30*  MCV 90.4 96.3  --  92.0 94.3  --   --   --   --   PLT 426* 238  --  218 227   < > 228 258 295   < > = values in this interval not displayed.    Lab Results  Component Value Date   TSH 1.773 01/24/2021   Lab Results  Component Value Date   HGBA1C 5.7 07/05/2021   Lab Results  Component Value Date   CHOL 110 07/05/2021   HDL 43 07/05/2021   LDLCALC 57 07/05/2021   TRIG 51 07/05/2021   CHOLHDL 2.8 08/08/2020    Significant Diagnostic Results in last 30 days:  No results  found.  Assessment/Plan  1. Acute on chronic diastolic CHF (congestive heart failure) (HCC) Weight is 150, tends to have less symptoms in the 146-147 range Will order CXR Change from lasix to torsemide Watch intake and BMP closely He is following up with cardiology soon.   2. Hypoxia Mild on 2 liters Can titrate tomorrow as tolerated for sat >90%    Family/ staff Communication: Spoke with Herbert Pun  Labs/tests ordered:  CBC BMP 10/2

## 2021-12-06 ENCOUNTER — Non-Acute Institutional Stay (SKILLED_NURSING_FACILITY): Payer: Medicare Other | Admitting: Adult Health

## 2021-12-06 ENCOUNTER — Encounter: Payer: Self-pay | Admitting: Adult Health

## 2021-12-06 DIAGNOSIS — I69391 Dysphagia following cerebral infarction: Secondary | ICD-10-CM | POA: Diagnosis not present

## 2021-12-06 DIAGNOSIS — I69354 Hemiplegia and hemiparesis following cerebral infarction affecting left non-dominant side: Secondary | ICD-10-CM | POA: Diagnosis not present

## 2021-12-06 DIAGNOSIS — I5033 Acute on chronic diastolic (congestive) heart failure: Secondary | ICD-10-CM

## 2021-12-06 DIAGNOSIS — R0902 Hypoxemia: Secondary | ICD-10-CM

## 2021-12-06 DIAGNOSIS — R0602 Shortness of breath: Secondary | ICD-10-CM

## 2021-12-06 DIAGNOSIS — S065X3S Traumatic subdural hemorrhage with loss of consciousness of 1 hour to 5 hours 59 minutes, sequela: Secondary | ICD-10-CM | POA: Diagnosis not present

## 2021-12-06 DIAGNOSIS — R293 Abnormal posture: Secondary | ICD-10-CM | POA: Diagnosis not present

## 2021-12-06 DIAGNOSIS — R41841 Cognitive communication deficit: Secondary | ICD-10-CM | POA: Diagnosis not present

## 2021-12-06 DIAGNOSIS — R278 Other lack of coordination: Secondary | ICD-10-CM | POA: Diagnosis not present

## 2021-12-06 DIAGNOSIS — R569 Unspecified convulsions: Secondary | ICD-10-CM | POA: Diagnosis not present

## 2021-12-06 DIAGNOSIS — R41840 Attention and concentration deficit: Secondary | ICD-10-CM | POA: Diagnosis not present

## 2021-12-06 DIAGNOSIS — G4089 Other seizures: Secondary | ICD-10-CM | POA: Diagnosis not present

## 2021-12-06 DIAGNOSIS — I69812 Visuospatial deficit and spatial neglect following other cerebrovascular disease: Secondary | ICD-10-CM | POA: Diagnosis not present

## 2021-12-06 DIAGNOSIS — M6389 Disorders of muscle in diseases classified elsewhere, multiple sites: Secondary | ICD-10-CM | POA: Diagnosis not present

## 2021-12-06 DIAGNOSIS — H538 Other visual disturbances: Secondary | ICD-10-CM | POA: Diagnosis not present

## 2021-12-06 DIAGNOSIS — E860 Dehydration: Secondary | ICD-10-CM | POA: Diagnosis not present

## 2021-12-06 DIAGNOSIS — I5022 Chronic systolic (congestive) heart failure: Secondary | ICD-10-CM | POA: Diagnosis not present

## 2021-12-06 LAB — CBC AND DIFFERENTIAL
HCT: 28 — AB (ref 41–53)
Hemoglobin: 9.2 — AB (ref 13.5–17.5)
Platelets: 338 10*3/uL (ref 150–400)
WBC: 8.1

## 2021-12-06 LAB — COMPREHENSIVE METABOLIC PANEL
Calcium: 9.3 (ref 8.7–10.7)
eGFR: 87

## 2021-12-06 LAB — BASIC METABOLIC PANEL
BUN: 33 — AB (ref 4–21)
CO2: 21 (ref 13–22)
Chloride: 110 — AB (ref 99–108)
Creatinine: 0.9 (ref 0.6–1.3)
Glucose: 133
Potassium: 4.1 mEq/L (ref 3.5–5.1)
Sodium: 145 (ref 137–147)

## 2021-12-06 LAB — CBC: RBC: 3.35 — AB (ref 3.87–5.11)

## 2021-12-06 MED ORDER — AMOXICILLIN-POT CLAVULANATE 875-125 MG PO TABS: 1.0000 | ORAL_TABLET | Freq: Two times a day (BID) | ORAL | 0 refills | Status: AC

## 2021-12-06 NOTE — Progress Notes (Signed)
Location:  Occupational psychologist of Service:  SNF (31) Provider:   Cindi Carbon, [place] [phone]   [name], MD  Patient Care Team: [name], MD as PCP - General (Internal Medicine) Larey Dresser, MD as PCP - Cardiology (Cardiology) [name], MD as Consulting Physician (Ophthalmology) [name], MD (Internal Medicine)  Extended Emergency Contact Information Primary Emergency Contact: [name] Address: [address]          [place], [place] [address] [place] Phone: [phone] Mobile Phone: [phone] Relation: Spouse Secondary Emergency Contact: [name], [place] [address] [name] Phone: [phone] Relation: Son  Code Status:  DNR Goals of care: Advanced Directive information    09/13/2021   10:08 AM  Advanced Directives  Does Patient Have a Medical Advance Directive? Yes  Type of Paramedic of [place];Living will;Out of facility DNR (pink MOST or yellow form)  Does patient want to make changes to medical advance directive? No - Patient declined  Copy of [place] in Chart? Yes - validated most recent copy scanned in chart (See row information)     Chief Complaint  Patient presents with   Acute Visit    F/u sob    HPI:  Pt is a 81 y.o. male seen today for an acute visit for follow up regarding CHF, hypoxia, and SOB.   PMH includes syncope with fall in May of 2022, TBI with SDH with craniotomy, acute right MCA s/p thrombectomy, bioprosthetic mitral and aortic valve replacement, DM II, diastiolic CHF, BPH, and afib.  On life long amoxicillin for endocarditis  Echo 03/31/21 EF 45-50% For the past month he has been on escalating dosing of lasix due to weight gain and sob. . We have tried to be judicious due to his hx of hypernatremia. He has been requiring 1.5 to 2 liters of  oxygen. Notices to have mild increased work of breathing with rate 24. Pt is minimally verbally but does endorse sob. He received 20 mg of torsemide on 9/29 as lasix did not seem to lower his weight. He lost 5+lbs.   EKG was done 11/26/21 which showed normal sinus rhythm with first degree AV block. Did have PVC.    CXR 10/31/21 which showed CHF  CXR done on 9/29 showed minimal bibasilar infiltrate.   His wife saw him cough one day only. Nurses have not seen a cough. No runny nose, fever, elevated WBC or sputum production.  Nurse called the oncall provider on 9/30 and received an order for Augmentin for possible pna. Pharmacy questioned this due to duplicate therapy since he is on amoxicillin for endocarditis prophylaxis. She called the oncall provider back and levaquin was started on 10/1.  Later that day patient had a seizure with eyes twitching arms tremoring lasting 75 seconds.   He did have a seizure earlier this month as well and neurology was notified.    For my visit today he is alert and able to respond and intermittently answer my questions with hand signals. He does endorse shortness of breath. He is on 1.5 liters of oxygen with a sat of 95%.  He is eating and drinking. 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    Other and unspecified hyperlipidemia    Pre-diabetes    Previous back surgery 1978, jan 2007   Psychosexual dysfunction with inhibited sexual excitement    SOB (shortness of  breath)    "with heavy exercise"   Stroke ([place]) 08/2013   "I WAS IN AFIB AND THREW A CLOT, THE EFFECTS WERE TRANSITORY AND DIDNT LAST BUT FOR 30 MINUTES"    Thoracic aortic aneurysm [facility])    Past Surgical History:  Procedure Laterality Date   AORTIC AND MITRAL VALVE REPLACEMENT     09/2004   CARDIOVERSION     3 times from 2004-2006   CARDIOVERSION N/A 09/26/2013   Procedure: CARDIOVERSION;  Surgeon: Larey Dresser, MD;  Location: [place];  Service: Cardiovascular;  Laterality: N/A;   CARDIOVERSION N/A 06/19/2014   Procedure: CARDIOVERSION;  Surgeon: Jerline Pain, MD;  Location: [place];  Service: Cardiovascular;  Laterality: N/A;   CARDIOVERSION N/A 04/24/2017   Procedure: CARDIOVERSION;  Surgeon: Larey Dresser, MD;  Location: [place];  Service: Cardiovascular;  Laterality: N/A;   CARDIOVERSION N/A 06/08/2017   Procedure: CARDIOVERSION;  Surgeon: Pixie Casino, MD;  Location: [facility] ENDOSCOPY;  Service: Cardiovascular;  Laterality: N/A;   CARDIOVERSION N/A 08/24/2017   Procedure: CARDIOVERSION;  Surgeon: Skeet Latch, MD;  Location: [facility] ENDOSCOPY;  Service: Cardiovascular;  Laterality: N/A;   CARDIOVERSION N/A 02/14/2018   Procedure: CARDIOVERSION;  Surgeon: Larey Dresser, MD;  Location: [facility] ENDOSCOPY;  Service: Cardiovascular;  Laterality: N/A;   COLONOSCOPY     CRANIOTOMY N/A 07/28/2020   Procedure: CRANIOTOMY FOR EVACUATION OF SUBDURAL HEMATOMA;  Surgeon: Eustace Fatula, MD;  Location: [place];  Service: Neurosurgery;  Laterality: N/A;   IR ANGIO VERTEBRAL SEL SUBCLAVIAN INNOMINATE UNI R MOD SED  08/07/2020   IR CT HEAD LTD  08/07/2020   IR PERCUTANEOUS ART THROMBECTOMY/INFUSION INTRACRANIAL INC DIAG ANGIO  08/07/2020   laminectomies     10/1975 and in 03/2005   RADIOLOGY WITH ANESTHESIA N/A 08/07/2020   Procedure: IR WITH ANESTHESIA;  Surgeon: Luanne Bras, MD;  Location: [place];  Service: Radiology;  Laterality: N/A;   TEE WITHOUT CARDIOVERSION N/A 12/18/2020    Procedure: TRANSESOPHAGEAL ECHOCARDIOGRAM (TEE);  Surgeon: Larey Dresser, MD;  Location: [facility] ENDOSCOPY;  Service: Cardiovascular;  Laterality: N/A;   Frannie Right 04/03/2018   Procedure: TOTAL HIP ARTHROPLASTY ANTERIOR APPROACH;  Surgeon: Paralee Cancel, MD;  Location: [facility];  Service: Orthopedics;  Laterality: Right;  70 mins    Allergies  Allergen Reactions   Naproxen Hives   No [facility] Food Allergies Other (See Comments)    Scallops - distress, nausea and vomitting    Outpatient Encounter Medications as of 12/06/2021  Medication Sig   amoxicillin-clavulanate (AUGMENTIN) 875-125 MG tablet Take 1 tablet by mouth 2 (two) times daily for 7 days.   acetaminophen (TYLENOL) 325 MG tablet Take 650 mg by mouth as needed for mild pain.   Amantadine HCl 100 MG  tablet Take 100 mg by mouth 2 (two) times daily.   amLODipine (NORVASC) 5 MG tablet Take 5 mg by mouth daily.   apixaban (ELIQUIS) 5 MG TABS tablet Take 1 tablet (5 mg total) by mouth 2 (two) times daily.   Artificial Saliva (BIOTENE MOISTURIZING MOUTH MT) Use as directed 2 sprays in the mouth or throat in the morning, at noon, and at bedtime.   atorvastatin (LIPITOR) 80 MG tablet Take 1 tablet (80 mg total) by mouth daily.   bisacodyl (DULCOLAX) 10 MG suppository Place 1 suppository (10 mg total) rectally daily as needed for moderate constipation or mild constipation.   clobetasol (TEMOVATE) 0.05 % external solution Apply 1 application  topically as needed (itching).   diazePAM, 15 MG Dose, (VALTOCO 15 MG DOSE) 2 x 7.5 MG/0.1ML LQPK Place 7.5 mg into each nostril (total of 15 mg) for grand mal seizure lasting over 5 minutes. Can repeat once in 4 hours. Do Not use more than twice in 24 hours   dofetilide (TIKOSYN) 125 MCG capsule Take 3 capsules (375 mcg total) by mouth 2 (two) times daily.   famotidine (PEPCID) 20 MG tablet Take 20 mg by mouth at bedtime as needed for indigestion.   finasteride  (PROSCAR) 5 MG tablet Take 1 tablet (5 mg total) by mouth daily.   Glucerna (GLUCERNA) LIQD Take 237 mLs by mouth daily.   hydrocortisone 2.5 % cream Apply 1 application. topically daily as needed (itching).   lacosamide (VIMPAT) 50 MG TABS tablet Take 1 tablet (50 mg total) by mouth 2 (two) times daily.   levalbuterol (XOPENEX) 0.63 MG/3ML nebulizer solution Take 0.63 mg by nebulization every 6 (six) hours as needed for wheezing or shortness of breath.   magnesium gluconate (MAGONATE) 500 MG tablet Take 1 tablet (500 mg total) by mouth daily.   methenamine (MANDELAMINE) 1 g tablet Take 1,000 mg by mouth 2 (two) times daily.   methylphenidate (RITALIN) 5 MG tablet Use at 7 AM  and again at 2 PM po   Multiple Vitamin (MULTIVITAMIN WITH MINERALS) TABS tablet Take 1 tablet by mouth daily.   omeprazole (PRILOSEC) 20 MG capsule Take 20 mg by mouth daily.   Polyethyl Glycol-Propyl Glycol (SYSTANE) 0.4-0.3 % SOLN Place 2 drops into both eyes daily.   polyethylene glycol (MIRALAX / GLYCOLAX) 17 g packet Take 17 g by mouth every other day.   potassium chloride (KLOR-CON) 10 MEQ tablet Take 10 mEq by mouth daily.   senna-docusate (SENOKOT-S) 8.6-50 MG tablet Take 1 tablet by mouth at bedtime as needed.   [DISCONTINUED] amoxicillin (AMOXIL) 500 MG capsule Take 500 mg by mouth 2 (two) times daily.  *NO STOP DATE* - per pharmacy can open capsule -Do not crush   [DISCONTINUED] torsemide (DEMADEX) 20 MG tablet Take 1 tablet (20 mg total) by mouth daily.   No facility-administered encounter medications on file as of 12/06/2021.    Review of Systems  Unable to perform ROS: Patient nonverbal    Immunization History  Administered Date(s) Administered   Fluad Quad(high Dose 65+) 12/06/2018, 12/09/2020   Influenza Whole 12/25/2008, 01/05/2010, 12/30/2011   Influenza, High Dose Seasonal PF 12/14/2016   Influenza,inj,Quad PF,6+ Mos 12/19/2013   Influenza,inj,quad, With Preservative 11/22/2017    Influenza-Unspecified 12/14/2015, 12/24/2019   Moderna Covid-19 Vaccine Bivalent Booster 64yr & up 01/12/2021   Moderna SARS-COV2 Booster Vaccination 01/02/2020, 06/26/2020   Moderna Sars-Covid-2 Vaccination 04/08/2019, 05/05/2019   Pneumococcal Conjugate-13 12/19/2013   Pneumococcal Polysaccharide-23 09/24/2007, 05/22/2017   Td  12/25/2008   Tdap 03/22/2018   Zoster Recombinat (Shingrix) 07/16/2019, 10/28/2019   Zoster, Live 09/24/2007   Pertinent  Health Maintenance Due  Topic Date Due   INFLUENZA VACCINE  10/05/2021   OPHTHALMOLOGY EXAM  10/06/2022 (Originally 10/04/1950)   HEMOGLOBIN A1C  01/05/2022   FOOT EXAM  10/23/2022   COLONOSCOPY (Pts 45-36yr Insurance coverage will need to be confirmed)  Discontinued      08/10/2021   11:10 PM 08/11/2021    8:00 AM 08/11/2021    7:30 PM 08/12/2021    8:00 AM 10/13/2021    2:32 PM  FDarienin the past year?     0  Patient Fall Risk Level High fall risk High fall risk High fall risk High fall risk    Functional Status Survey:    Vitals:   12/06/21 1804  BP: 117/71  Pulse: 80  Resp: (!) 24  Temp: 98.4 F (36.9 C)  SpO2: 95%  Weight: 144 lb 12.8 oz (65.7 kg)   Body mass index is 22.68 kg/m.  Wt Readings from Last 3 Encounters:  12/06/21 144 lb 12.8 oz (65.7 kg)  12/03/21 150 lb (68 kg)  11/30/21 150 lb 6.4 oz (68.2 kg)    Physical Exam Constitutional:      General: He is not in acute distress.    Appearance: He is not diaphoretic.  HENT:     Head: Normocephalic and atraumatic.     Nose: Nose normal.     Mouth/Throat:     Mouth: Mucous membranes are moist.     Pharynx: Oropharynx is clear.  Eyes:     Conjunctiva/sclera: Conjunctivae normal.     Pupils: Pupils are equal, round, and reactive to light.  Neck:     Thyroid: No thyromegaly.     Vascular: No JVD.     Trachea: No tracheal deviation.  Cardiovascular:     Rate and Rhythm: Normal rate and regular rhythm.     Heart sounds: No murmur heard. Pulmonary:      Breath sounds: Rales (bilateral with decreased left base) present. No wheezing.     Comments: Mild increased WOB Abdominal:     General: Bowel sounds are normal. There is no distension.     Palpations: Abdomen is soft.     Tenderness: There is no abdominal tenderness.  Musculoskeletal:     Right lower leg: No edema.     Left lower leg: No edema.     Comments: Trace left arm. Trace edema to ankles  Lymphadenopathy:     Cervical: No cervical adenopathy.  Skin:    General: Skin is warm and dry.  Neurological:     Mental Status: He is alert. Mental status is at baseline.     Comments: Left sided weakness     Labs reviewed: Recent Labs    12/18/20 1630 12/19/20 0610 12/19/20 1632 12/20/20 0153 02/08/21 0000 02/09/21 0000 08/10/21 0937 08/10/21 0957 08/11/21 0408 08/12/21 0107 08/16/21 0000 11/15/21 0000 11/22/21 0000 11/29/21 0000  NA  --  139  --    < > 150*   < > 141   < > 140 142   < > 137 140 140  K  --  3.5  --    < > 3.7   < > 3.8   < > 3.6 3.9   < > 4.1 3.8 4.3  CL  --  108  --    < > 107   < >  111  --  109 113*   < > 102 105 106  CO2  --  23  --    < > 22   < > 16*  --  24 23   < > 25* 23* 22  GLUCOSE  --  186*  --    < >  --    < > 148*  --  120* 143*  --   --   --   --   BUN  --  16  --    < > 23*   < > 22  --  17 17   < > 18 19 31*  CREATININE  --  0.76  --    < > 0.9   < > 1.18  --  0.81 0.83   < > 0.7 0.8 0.9  CALCIUM  --  7.7*  --    < > 9.9   < > 8.8*  --  8.6* 8.7*   < > 9.6 9.1 9.3  MG 1.9 2.0 1.9   < > 2.1  --   --   --  1.9 2.0  --   --   --   --   PHOS 3.1 2.7 2.4*  --   --   --   --   --   --   --   --   --   --   --    < > = values in this interval not displayed.    Recent Labs    07/12/21 1056 08/10/21 0937 08/11/21 0408 09/07/21 0000 09/08/21 0000  AST '22 29 27 24 24  '$ ALT '22 26 23 18 18  '$ ALKPHOS  --  93 80 105 105  BILITOT 0.7 0.5 0.8  --   --   PROT 7.2 6.4* 5.8*  --   --   ALBUMIN  --  3.3* 3.1* 3.5 3.5    Recent Labs     07/12/21 1056 08/10/21 0937 08/10/21 0957 08/11/21 0408 08/12/21 0107 09/07/21 0000 10/31/21 0000 11/01/21 0000 11/08/21 0000  WBC 9.0 16.1*  --  9.1 10.2   < > 9.3 8.9 7.9  NEUTROABS 6,804 14.3*  --   --  7.8*  --   --   --   --   HGB 10.9* 10.0*   < > 9.4* 9.4*   < > 9.1* 9.4* 9.9*  HCT 33.9* 31.6*   < > 28.7* 29.8*   < > 27* 28* 30*  MCV 90.4 96.3  --  92.0 94.3  --   --   --   --   PLT 426* 238  --  218 227   < > 228 258 295   < > = values in this interval not displayed.    Lab Results  Component Value Date   TSH 1.773 01/24/2021   Lab Results  Component Value Date   HGBA1C 5.7 07/05/2021   Lab Results  Component Value Date   CHOL 110 07/05/2021   HDL 43 07/05/2021   LDLCALC 57 07/05/2021   TRIG 51 07/05/2021   CHOLHDL 2.8 08/08/2020    Significant Diagnostic Results in last 30 days:  No results found.  Assessment/Plan  1. Shortness of breath His xray is showing a very minimal pulmonary infiltrate with no symptoms of pna such as cough fever, etc. The xray is improved from the prior film.  Given that therapy is already started and he did not improved with diuresis in  terms of feeling short of breath will continue therapy but change from Levaquin to Augmentin due to QT interval prolongation and concern for lowering seizure threshold.  Hold amoxicillin while on Augmentin F/U CXR in 2 weeks.  On eliquis. Low concern for PE.  He will follow up with cardiology on Monday.   2. Acute on chronic diastolic CHF (congestive heart failure) (HCC) Improved  D/C torsemide due to 5 loss.   3. Hypoxia Minimal on 1.5 liters of oxygen. Can taper as tolerated.   4. Seizure ? If this is drug induced or due to acute illness along with hx of TBI and seizure Would need neurology f/u if this happens again.  On Vimpat    Family/ staff Communication: Spoke with Glorianne Manchester POA  Labs/tests ordered:  CBC BMP  pending.

## 2021-12-07 DIAGNOSIS — S065X3S Traumatic subdural hemorrhage with loss of consciousness of 1 hour to 5 hours 59 minutes, sequela: Secondary | ICD-10-CM | POA: Diagnosis not present

## 2021-12-07 DIAGNOSIS — I69354 Hemiplegia and hemiparesis following cerebral infarction affecting left non-dominant side: Secondary | ICD-10-CM | POA: Diagnosis not present

## 2021-12-07 DIAGNOSIS — R41840 Attention and concentration deficit: Secondary | ICD-10-CM | POA: Diagnosis not present

## 2021-12-07 DIAGNOSIS — M6389 Disorders of muscle in diseases classified elsewhere, multiple sites: Secondary | ICD-10-CM | POA: Diagnosis not present

## 2021-12-07 DIAGNOSIS — R278 Other lack of coordination: Secondary | ICD-10-CM | POA: Diagnosis not present

## 2021-12-07 DIAGNOSIS — R293 Abnormal posture: Secondary | ICD-10-CM | POA: Diagnosis not present

## 2021-12-07 DIAGNOSIS — G4089 Other seizures: Secondary | ICD-10-CM | POA: Diagnosis not present

## 2021-12-07 DIAGNOSIS — R41841 Cognitive communication deficit: Secondary | ICD-10-CM | POA: Diagnosis not present

## 2021-12-07 DIAGNOSIS — J9601 Acute respiratory failure with hypoxia: Secondary | ICD-10-CM | POA: Diagnosis not present

## 2021-12-07 DIAGNOSIS — D649 Anemia, unspecified: Secondary | ICD-10-CM | POA: Diagnosis not present

## 2021-12-07 DIAGNOSIS — R0602 Shortness of breath: Secondary | ICD-10-CM | POA: Diagnosis not present

## 2021-12-07 DIAGNOSIS — H538 Other visual disturbances: Secondary | ICD-10-CM | POA: Diagnosis not present

## 2021-12-07 DIAGNOSIS — I69812 Visuospatial deficit and spatial neglect following other cerebrovascular disease: Secondary | ICD-10-CM | POA: Diagnosis not present

## 2021-12-07 DIAGNOSIS — I69391 Dysphagia following cerebral infarction: Secondary | ICD-10-CM | POA: Diagnosis not present

## 2021-12-07 DIAGNOSIS — I5022 Chronic systolic (congestive) heart failure: Secondary | ICD-10-CM | POA: Diagnosis not present

## 2021-12-07 LAB — POCT ERYTHROCYTE SEDIMENTATION RATE, NON-AUTOMATED: Sed Rate: 39

## 2021-12-08 DIAGNOSIS — G4089 Other seizures: Secondary | ICD-10-CM | POA: Diagnosis not present

## 2021-12-08 DIAGNOSIS — I69391 Dysphagia following cerebral infarction: Secondary | ICD-10-CM | POA: Diagnosis not present

## 2021-12-08 DIAGNOSIS — I5022 Chronic systolic (congestive) heart failure: Secondary | ICD-10-CM | POA: Diagnosis not present

## 2021-12-08 DIAGNOSIS — M6389 Disorders of muscle in diseases classified elsewhere, multiple sites: Secondary | ICD-10-CM | POA: Diagnosis not present

## 2021-12-08 DIAGNOSIS — I69354 Hemiplegia and hemiparesis following cerebral infarction affecting left non-dominant side: Secondary | ICD-10-CM | POA: Diagnosis not present

## 2021-12-08 DIAGNOSIS — R293 Abnormal posture: Secondary | ICD-10-CM | POA: Diagnosis not present

## 2021-12-08 DIAGNOSIS — R278 Other lack of coordination: Secondary | ICD-10-CM | POA: Diagnosis not present

## 2021-12-08 DIAGNOSIS — R41841 Cognitive communication deficit: Secondary | ICD-10-CM | POA: Diagnosis not present

## 2021-12-08 DIAGNOSIS — I69812 Visuospatial deficit and spatial neglect following other cerebrovascular disease: Secondary | ICD-10-CM | POA: Diagnosis not present

## 2021-12-08 DIAGNOSIS — H538 Other visual disturbances: Secondary | ICD-10-CM | POA: Diagnosis not present

## 2021-12-08 DIAGNOSIS — S065X3S Traumatic subdural hemorrhage with loss of consciousness of 1 hour to 5 hours 59 minutes, sequela: Secondary | ICD-10-CM | POA: Diagnosis not present

## 2021-12-08 DIAGNOSIS — R41840 Attention and concentration deficit: Secondary | ICD-10-CM | POA: Diagnosis not present

## 2021-12-09 ENCOUNTER — Encounter: Payer: Self-pay | Admitting: Adult Health

## 2021-12-09 ENCOUNTER — Non-Acute Institutional Stay (SKILLED_NURSING_FACILITY): Payer: Medicare Other | Admitting: Adult Health

## 2021-12-09 DIAGNOSIS — I69391 Dysphagia following cerebral infarction: Secondary | ICD-10-CM | POA: Diagnosis not present

## 2021-12-09 DIAGNOSIS — G4089 Other seizures: Secondary | ICD-10-CM | POA: Diagnosis not present

## 2021-12-09 DIAGNOSIS — I5033 Acute on chronic diastolic (congestive) heart failure: Secondary | ICD-10-CM

## 2021-12-09 DIAGNOSIS — M6389 Disorders of muscle in diseases classified elsewhere, multiple sites: Secondary | ICD-10-CM | POA: Diagnosis not present

## 2021-12-09 DIAGNOSIS — R41841 Cognitive communication deficit: Secondary | ICD-10-CM | POA: Diagnosis not present

## 2021-12-09 DIAGNOSIS — R140 Abdominal distension (gaseous): Secondary | ICD-10-CM

## 2021-12-09 DIAGNOSIS — R0602 Shortness of breath: Secondary | ICD-10-CM | POA: Diagnosis not present

## 2021-12-09 DIAGNOSIS — I69812 Visuospatial deficit and spatial neglect following other cerebrovascular disease: Secondary | ICD-10-CM | POA: Diagnosis not present

## 2021-12-09 DIAGNOSIS — R278 Other lack of coordination: Secondary | ICD-10-CM | POA: Diagnosis not present

## 2021-12-09 DIAGNOSIS — R1312 Dysphagia, oropharyngeal phase: Secondary | ICD-10-CM | POA: Diagnosis not present

## 2021-12-09 DIAGNOSIS — I69354 Hemiplegia and hemiparesis following cerebral infarction affecting left non-dominant side: Secondary | ICD-10-CM | POA: Diagnosis not present

## 2021-12-09 DIAGNOSIS — I5022 Chronic systolic (congestive) heart failure: Secondary | ICD-10-CM | POA: Diagnosis not present

## 2021-12-09 DIAGNOSIS — S065X3S Traumatic subdural hemorrhage with loss of consciousness of 1 hour to 5 hours 59 minutes, sequela: Secondary | ICD-10-CM | POA: Diagnosis not present

## 2021-12-09 DIAGNOSIS — R0902 Hypoxemia: Secondary | ICD-10-CM

## 2021-12-09 DIAGNOSIS — H538 Other visual disturbances: Secondary | ICD-10-CM | POA: Diagnosis not present

## 2021-12-09 DIAGNOSIS — R41840 Attention and concentration deficit: Secondary | ICD-10-CM | POA: Diagnosis not present

## 2021-12-09 DIAGNOSIS — R293 Abnormal posture: Secondary | ICD-10-CM | POA: Diagnosis not present

## 2021-12-09 MED ORDER — PREDNISONE 20 MG PO TABS: 20.0000 mg | ORAL_TABLET | Freq: Two times a day (BID) | ORAL | 0 refills | Status: DC

## 2021-12-09 NOTE — Progress Notes (Signed)
Location:  Medical illustrator of Service:  SNF (31) Provider:   Peggye Ley, ANP [facility] [phone]   Mahlon Gammon, MD  Patient Care Team: Mahlon Gammon, MD as PCP - General (Internal Medicine) Laurey Morale, MD as PCP - Cardiology (Cardiology) Mia Creek, MD as Consulting Physician (Ophthalmology) Etta Grandchild, MD (Internal Medicine)  Extended Emergency Contact Information Primary Emergency Contact: Kae Heller Address: [address]          [place], [place] [address] [place] Home Phone: [phone] Mobile Phone: [phone] Relation: Spouse Secondary Emergency Contact: Phineas Inches, [place] [address] [place] Home Phone: [phone] Relation: Son  Code Status:  DNR Goals of care: Advanced Directive information    09/13/2021   10:08 AM  Advanced Directives  Does Patient Have a Medical Advance Directive? Yes  Type of Estate agent of [place];Living will;Out of facility DNR (pink MOST or yellow form)  Does patient want to make changes to medical advance directive? No - Patient declined  Copy of Healthcare Power of Attorney in Chart? Yes - validated most recent copy scanned in chart (See row information)     Chief Complaint  Patient presents with   Acute Visit    sob    HPI:  Pt is a 81 y.o. male seen today for an acute visit for follow up regarding CHF, hypoxia, and SOB.   PMH includes syncope with fall in May of 2022, TBI with SDH with craniotomy, acute right MCA s/p thrombectomy, bioprosthetic mitral and aortic valve replacement, DM II, diastiolic CHF, BPH, and afib.  On life long amoxicillin for endocarditis Echo 03/31/21 EF 45-50%  For the past month he has been on escalating dosing of lasix due to weight gain and sob. .We have tried to be judicious due to his hx of hypernatremia. Later this was changed to torsemide due to lack  of weight loss with furosemide. He did lose 5 lbs but sob remained. He is Currently on Augmentin day 2/7 for  xray results showing minimal bibasilar infiltrate  Had two doses of levaquin prior to taking Augmentin which was stopped due to seizure and risk of QT interval prolongation.  At this time he is on 2-3 liters of oxygen. Has not made improvement. Weight is back up 4-5 lbs. Continues with increased WOB. Does not have a fever. Now is having mild upper airway wheezing. Nurse gave xopenex with some improvement. ESR returned at 39, BNP 213.   He has a hx of dysphagia and is on a regular diet. There are no signs of obvious aspiration per ST. No coughing with the meal. No runny nose or watery eyes. No fever or sputum production. CBC returned with WBC 8.1, Hgb 9.2, Plt 338 12/06/21  Studies EKG was done 11/26/21 which showed normal sinus rhythm with first degree AV block. Did have PVC.    CXR 10/31/21 which showed CHF  CXR done on 9/29 showed minimal bibasilar infiltrate.     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    Other and unspecified hyperlipidemia    Pre-diabetes    Previous back surgery 1978, jan 2007   Psychosexual dysfunction with inhibited sexual excitement    SOB (shortness of breath)    "with heavy exercise"   Stroke ([place]) 08/2013   "I WAS IN AFIB AND THREW A CLOT, THE EFFECTS WERE TRANSITORY AND DIDNT LAST BUT FOR 30 MINUTES"    Thoracic aortic aneurysm [facility])    Past Surgical History:   Procedure Laterality Date   AORTIC AND MITRAL VALVE REPLACEMENT     09/2004   CARDIOVERSION     3 times from 2004-2006   CARDIOVERSION N/A 09/26/2013   Procedure: CARDIOVERSION;  Surgeon: Larey Dresser, MD;  Location: [place];  Service: Cardiovascular;  Laterality: N/A;   CARDIOVERSION N/A 06/19/2014   Procedure: CARDIOVERSION;  Surgeon: Jerline Pain, MD;  Location: [place];  Service: Cardiovascular;  Laterality: N/A;   CARDIOVERSION N/A 04/24/2017   Procedure: CARDIOVERSION;  Surgeon: Larey Dresser, MD;  Location: [place];  Service: Cardiovascular;  Laterality: N/A;   CARDIOVERSION N/A 06/08/2017   Procedure: CARDIOVERSION;  Surgeon: Pixie Casino, MD;  Location: [facility] ENDOSCOPY;  Service: Cardiovascular;  Laterality: N/A;   CARDIOVERSION N/A 08/24/2017   Procedure: CARDIOVERSION;  Surgeon: Skeet Latch, MD;  Location: [facility] ENDOSCOPY;  Service: Cardiovascular;  Laterality: N/A;   CARDIOVERSION N/A 02/14/2018   Procedure: CARDIOVERSION;  Surgeon: Larey Dresser, MD;  Location: [facility] ENDOSCOPY;  Service: Cardiovascular;  Laterality: N/A;   COLONOSCOPY     CRANIOTOMY N/A 07/28/2020   Procedure: CRANIOTOMY FOR EVACUATION OF SUBDURAL HEMATOMA;  Surgeon: Eustace Routzahn, MD;  Location: [place];  Service: Neurosurgery;  Laterality: N/A;   IR ANGIO VERTEBRAL SEL SUBCLAVIAN INNOMINATE UNI R MOD SED  08/07/2020   IR CT HEAD LTD  08/07/2020   IR PERCUTANEOUS ART THROMBECTOMY/INFUSION INTRACRANIAL INC DIAG ANGIO  08/07/2020   laminectomies     10/1975 and in 03/2005   RADIOLOGY WITH ANESTHESIA N/A 08/07/2020   Procedure: IR WITH ANESTHESIA;  Surgeon: Luanne Bras, MD;  Location: [place];  Service: Radiology;  Laterality: N/A;   TEE WITHOUT CARDIOVERSION N/A 12/18/2020   Procedure: TRANSESOPHAGEAL ECHOCARDIOGRAM (TEE);  Surgeon: Larey Dresser, MD;  Location[facility] ENDOSCOPY;  Service: Cardiovascular;  Laterality: N/A;   [place] Right 04/03/2018    Procedure: TOTAL HIP ARTHROPLASTY ANTERIOR APPROACH;  Surgeon: Paralee Cancel, MD;  Location: [facility];  Service: Orthopedics;  Laterality: Right;  70 mins    Allergies  Allergen Reactions   Naproxen Hives   No [facility] Food Allergies Other (See Comments)    Scallops - distress, nausea and vomitting    Outpatient Encounter Medications as of 12/09/2021  Medication Sig   acetaminophen (TYLENOL) 325 MG tablet Take 650 mg by mouth as needed for mild pain.   Amantadine HCl 100 MG tablet Take 100 mg by mouth 2 (two) times daily.   amLODipine (NORVASC) 5 MG tablet Take 5 mg by mouth daily.   amoxicillin-clavulanate (AUGMENTIN) 875-125 MG tablet Take 1 tablet by mouth 2 (two) times daily for 7 days.   apixaban (ELIQUIS) 5 MG TABS tablet Take 1 tablet (5 mg total) by mouth 2 (  two) times daily.   Artificial Saliva (BIOTENE MOISTURIZING MOUTH MT) Use as directed 2 sprays in the mouth or throat in the morning, at noon, and at bedtime.   atorvastatin (LIPITOR) 80 MG tablet Take 1 tablet (80 mg total) by mouth daily.   bisacodyl (DULCOLAX) 10 MG suppository Place 1 suppository (10 mg total) rectally daily as needed for moderate constipation or mild constipation.   clobetasol (TEMOVATE) 0.05 % external solution Apply 1 application  topically as needed (itching).   diazePAM, 15 MG Dose, (VALTOCO 15 MG DOSE) 2 x 7.5 MG/0.1ML LQPK Place 7.5 mg into each nostril (total of 15 mg) for grand mal seizure lasting over 5 minutes. Can repeat once in 4 hours. Do Not use more than twice in 24 hours   dofetilide (TIKOSYN) 125 MCG capsule Take 3 capsules (375 mcg total) by mouth 2 (two) times daily.   famotidine (PEPCID) 20 MG tablet Take 20 mg by mouth at bedtime as needed for indigestion.   finasteride (PROSCAR) 5 MG tablet Take 1 tablet (5 mg total) by mouth daily.   Glucerna (GLUCERNA) LIQD Take 237 mLs by mouth daily.   hydrocortisone 2.5 % cream Apply 1 application. topically daily as needed (itching).    lacosamide (VIMPAT) 50 MG TABS tablet Take 1 tablet (50 mg total) by mouth 2 (two) times daily.   levalbuterol (XOPENEX) 0.63 MG/3ML nebulizer solution Take 0.63 mg by nebulization every 6 (six) hours as needed for wheezing or shortness of breath.   magnesium gluconate (MAGONATE) 500 MG tablet Take 1 tablet (500 mg total) by mouth daily.   methenamine (MANDELAMINE) 1 g tablet Take 1,000 mg by mouth 2 (two) times daily.   methylphenidate (RITALIN) 5 MG tablet Use at 7 AM  and again at 2 PM po   Multiple Vitamin (MULTIVITAMIN WITH MINERALS) TABS tablet Take 1 tablet by mouth daily.   omeprazole (PRILOSEC) 20 MG capsule Take 20 mg by mouth daily.   Polyethyl Glycol-Propyl Glycol (SYSTANE) 0.4-0.3 % SOLN Place 2 drops into both eyes daily.   polyethylene glycol (MIRALAX / GLYCOLAX) 17 g packet Take 17 g by mouth every other day.   potassium chloride (KLOR-CON) 10 MEQ tablet Take 10 mEq by mouth daily.   senna-docusate (SENOKOT-S) 8.6-50 MG tablet Take 1 tablet by mouth at bedtime as needed.   No facility-administered encounter medications on file as of 12/09/2021.    Review of Systems  Unable to perform ROS: Patient nonverbal    Immunization History  Administered Date(s) Administered   Fluad Quad(high Dose 65+) 12/06/2018, 12/09/2020   Influenza Whole 12/25/2008, 01/05/2010, 12/30/2011   Influenza, High Dose Seasonal PF 12/14/2016   Influenza,inj,Quad PF,6+ Mos 12/19/2013   Influenza,inj,quad, With Preservative 11/22/2017   Influenza-Unspecified 12/14/2015, 12/24/2019   Moderna Covid-19 Vaccine Bivalent Booster 61yrs & up 01/12/2021   Moderna SARS-COV2 Booster Vaccination 01/02/2020, 06/26/2020   Moderna Sars-Covid-2 Vaccination 04/08/2019, 05/05/2019   Pneumococcal Conjugate-13 12/19/2013   Pneumococcal Polysaccharide-23 09/24/2007, 05/22/2017   Td 12/25/2008   Tdap 03/22/2018   Zoster Recombinat (Shingrix) 07/16/2019, 10/28/2019   Zoster, Live 09/24/2007   Pertinent  Health  Maintenance Due  Topic Date Due   INFLUENZA VACCINE  10/05/2021   OPHTHALMOLOGY EXAM  10/06/2022 (Originally 10/04/1950)   HEMOGLOBIN A1C  01/05/2022   FOOT EXAM  10/23/2022   COLONOSCOPY (Pts 45-31yrs Insurance coverage will need to be confirmed)  Discontinued      08/10/2021   11:10 PM 08/11/2021    8:00 AM 08/11/2021  7:30 PM 08/12/2021    8:00 AM 10/13/2021    2:32 PM  Fall Risk  Falls in the past year?     0  Patient Fall Risk Level High fall risk High fall risk High fall risk High fall risk    Functional Status Survey:    Vitals:   12/09/21 1528  BP: 111/72  Pulse: 80  Resp: (!) 24  Temp: 97.7 F (36.5 C)  SpO2: 95%  Weight: 149 lb 11.2 oz (67.9 kg)   Body mass index is 23.45 kg/m.  Wt Readings from Last 3 Encounters:  12/09/21 149 lb 11.2 oz (67.9 kg)  12/06/21 144 lb 12.8 oz (65.7 kg)  12/03/21 150 lb (68 kg)    Physical Exam Constitutional:      General: He is not in acute distress.    Appearance: He is not diaphoretic.  HENT:     Head: Normocephalic and atraumatic.     Nose: Nose normal.     Mouth/Throat:     Mouth: Mucous membranes are moist.     Pharynx: Oropharynx is clear.  Eyes:     Conjunctiva/sclera: Conjunctivae normal.     Pupils: Pupils are equal, round, and reactive to light.  Neck:     Thyroid: No thyromegaly.     Vascular: No JVD.     Trachea: No tracheal deviation.  Cardiovascular:     Rate and Rhythm: Normal rate and regular rhythm.     Heart sounds: No murmur heard. Pulmonary:     Breath sounds: Rales (bilateral with decreased left base) present. No wheezing.     Comments: Mild increased WOB Abdominal:     General: Bowel sounds are normal. There is no distension.     Palpations: Abdomen is soft.     Tenderness: There is no abdominal tenderness.  Musculoskeletal:     Right lower leg: No edema.     Left lower leg: No edema.     Comments: Trace left arm. Trace edema to ankles  Lymphadenopathy:     Cervical: No cervical adenopathy.   Skin:    General: Skin is warm and dry.  Neurological:     Mental Status: He is alert. Mental status is at baseline.     Comments: Left sided weakness     Labs reviewed: Recent Labs    12/18/20 1630 12/19/20 0610 12/19/20 1632 12/20/20 0153 02/08/21 0000 02/09/21 0000 08/10/21 5597 08/10/21 0957 08/11/21 0408 08/12/21 0107 08/16/21 0000 11/15/21 0000 11/22/21 0000 11/29/21 0000  NA  --  139  --    < > 150*   < > 141   < > 140 142   < > 137 140 140  K  --  3.5  --    < > 3.7   < > 3.8   < > 3.6 3.9   < > 4.1 3.8 4.3  CL  --  108  --    < > 107   < > 111  --  109 113*   < > 102 105 106  CO2  --  23  --    < > 22   < > 16*  --  24 23   < > 25* 23* 22  GLUCOSE  --  186*  --    < >  --    < > 148*  --  120* 143*  --   --   --   --   BUN  --  16  --    < >  23*   < > 22  --  17 17   < > 18 19 31*  CREATININE  --  0.76  --    < > 0.9   < > 1.18  --  0.81 0.83   < > 0.7 0.8 0.9  CALCIUM  --  7.7*  --    < > 9.9   < > 8.8*  --  8.6* 8.7*   < > 9.6 9.1 9.3  MG 1.9 2.0 1.9   < > 2.1  --   --   --  1.9 2.0  --   --   --   --   PHOS 3.1 2.7 2.4*  --   --   --   --   --   --   --   --   --   --   --    < > = values in this interval not displayed.    Recent Labs    07/12/21 1056 08/10/21 0937 08/11/21 0408 09/07/21 0000 09/08/21 0000  AST $Re'22 29 27 24 24  'YCS$ ALT $R'22 26 23 18 18  'rs$ ALKPHOS  --  93 80 105 105  BILITOT 0.7 0.5 0.8  --   --   PROT 7.2 6.4* 5.8*  --   --   ALBUMIN  --  3.3* 3.1* 3.5 3.5    Recent Labs    07/12/21 1056 08/10/21 0937 08/10/21 0957 08/11/21 0408 08/12/21 0107 09/07/21 0000 10/31/21 0000 11/01/21 0000 11/08/21 0000  WBC 9.0 16.1*  --  9.1 10.2   < > 9.3 8.9 7.9  NEUTROABS 6,804 14.3*  --   --  7.8*  --   --   --   --   HGB 10.9* 10.0*   < > 9.4* 9.4*   < > 9.1* 9.4* 9.9*  HCT 33.9* 31.6*   < > 28.7* 29.8*   < > 27* 28* 30*  MCV 90.4 96.3  --  92.0 94.3  --   --   --   --   PLT 426* 238  --  218 227   < > 228 258 295   < > = values in this  interval not displayed.    Lab Results  Component Value Date   TSH 1.773 01/24/2021   Lab Results  Component Value Date   HGBA1C 5.7 07/05/2021   Lab Results  Component Value Date   CHOL 110 07/05/2021   HDL 43 07/05/2021   LDLCALC 57 07/05/2021   TRIG 51 07/05/2021   CHOLHDL 2.8 08/08/2020    Significant Diagnostic Results in last 30 days:  No results found.  Assessment/Plan   1. Hypoxia He has not made improvement and continues to required 2-3 liters of oxygen on Augmentin Weight is up 4-5 lbs. Will give one dose of torsemide 20 mg ?aspiration injury vs CHF VS BOOP CXR 9/29 showed minimal bibasilar infiltrate  If not improving may need CT of the chest  2. Shortness of breath Unchanged Has elevated ESR 39 - predniSONE (DELTASONE) 20 MG tablet; Take 1 tablet (20 mg total) by mouth 2 (two) times daily with a meal.  Dispense: 10 tablet; Refill: 0  3. Acute on chronic diastolic CHF (congestive heart failure) (HCC) BNP 213 Will give torsemide x1 only (hx of hypernatremia)  4. Abdominal distention Has had BM, needs to pass gas Try to get on toilet and/or turn to left side for relief.   5. Dysphagia Working with ST  On regular diet, discussed care with [place]  6. Left sided hemiplegia Continues to work with OT to improve left sided function, sitting up, and toileting. Continues to benefit from therapy.  Family/ staff Communication: Spoke with Glorianne Manchester POA  Labs/tests ordered:  BMP weekly

## 2021-12-10 ENCOUNTER — Other Ambulatory Visit: Payer: Self-pay | Admitting: Adult Health

## 2021-12-10 ENCOUNTER — Telehealth: Payer: Self-pay | Admitting: Adult Health

## 2021-12-10 DIAGNOSIS — I69354 Hemiplegia and hemiparesis following cerebral infarction affecting left non-dominant side: Secondary | ICD-10-CM | POA: Diagnosis not present

## 2021-12-10 DIAGNOSIS — Z23 Encounter for immunization: Secondary | ICD-10-CM | POA: Diagnosis not present

## 2021-12-10 DIAGNOSIS — H538 Other visual disturbances: Secondary | ICD-10-CM | POA: Diagnosis not present

## 2021-12-10 DIAGNOSIS — R41841 Cognitive communication deficit: Secondary | ICD-10-CM | POA: Diagnosis not present

## 2021-12-10 DIAGNOSIS — G4089 Other seizures: Secondary | ICD-10-CM | POA: Diagnosis not present

## 2021-12-10 DIAGNOSIS — I69391 Dysphagia following cerebral infarction: Secondary | ICD-10-CM | POA: Diagnosis not present

## 2021-12-10 DIAGNOSIS — I5022 Chronic systolic (congestive) heart failure: Secondary | ICD-10-CM | POA: Diagnosis not present

## 2021-12-10 DIAGNOSIS — R41840 Attention and concentration deficit: Secondary | ICD-10-CM | POA: Diagnosis not present

## 2021-12-10 DIAGNOSIS — R0602 Shortness of breath: Secondary | ICD-10-CM

## 2021-12-10 DIAGNOSIS — I69812 Visuospatial deficit and spatial neglect following other cerebrovascular disease: Secondary | ICD-10-CM | POA: Diagnosis not present

## 2021-12-10 DIAGNOSIS — M6389 Disorders of muscle in diseases classified elsewhere, multiple sites: Secondary | ICD-10-CM | POA: Diagnosis not present

## 2021-12-10 DIAGNOSIS — R278 Other lack of coordination: Secondary | ICD-10-CM | POA: Diagnosis not present

## 2021-12-10 DIAGNOSIS — S065X3S Traumatic subdural hemorrhage with loss of consciousness of 1 hour to 5 hours 59 minutes, sequela: Secondary | ICD-10-CM | POA: Diagnosis not present

## 2021-12-10 DIAGNOSIS — R293 Abnormal posture: Secondary | ICD-10-CM | POA: Diagnosis not present

## 2021-12-10 MED ORDER — TORSEMIDE 20 MG PO TABS: 20.0000 mg | ORAL_TABLET | Freq: Every day | ORAL | 3 refills | Status: DC | PRN

## 2021-12-10 NOTE — Telephone Encounter (Signed)
Discussed care of [name] [name] with Dr. [name]. He continues to have some mild increased WOB and requires 2 liters of oxygen. . We agreed to do a CT of the chest non contrast and I ordered torsemide prn. (Had a dose yesterday and lost 2 lbs)

## 2021-12-13 ENCOUNTER — Ambulatory Visit (HOSPITAL_COMMUNITY)
Admission: RE | Admit: 2021-12-13 | Discharge: 2021-12-13 | Disposition: A | Discharge status: Home / Self Care | Payer: Medicare Other | Source: Ambulatory Visit | Attending: Cardiology | Admitting: Cardiology

## 2021-12-13 VITALS — BP 120/70 | HR 71

## 2021-12-13 DIAGNOSIS — Z79899 Other long term (current) drug therapy: Secondary | ICD-10-CM | POA: Insufficient documentation

## 2021-12-13 DIAGNOSIS — Z792 Long term (current) use of antibiotics: Secondary | ICD-10-CM | POA: Diagnosis not present

## 2021-12-13 DIAGNOSIS — G4089 Other seizures: Secondary | ICD-10-CM | POA: Diagnosis not present

## 2021-12-13 DIAGNOSIS — R7881 Bacteremia: Secondary | ICD-10-CM | POA: Insufficient documentation

## 2021-12-13 DIAGNOSIS — H538 Other visual disturbances: Secondary | ICD-10-CM | POA: Diagnosis not present

## 2021-12-13 DIAGNOSIS — I38 Endocarditis, valve unspecified: Secondary | ICD-10-CM | POA: Diagnosis not present

## 2021-12-13 DIAGNOSIS — I480 Paroxysmal atrial fibrillation: Secondary | ICD-10-CM

## 2021-12-13 DIAGNOSIS — R011 Cardiac murmur, unspecified: Secondary | ICD-10-CM | POA: Insufficient documentation

## 2021-12-13 DIAGNOSIS — M6389 Disorders of muscle in diseases classified elsewhere, multiple sites: Secondary | ICD-10-CM | POA: Diagnosis not present

## 2021-12-13 DIAGNOSIS — I7121 Aneurysm of the ascending aorta, without rupture: Secondary | ICD-10-CM | POA: Insufficient documentation

## 2021-12-13 DIAGNOSIS — I110 Hypertensive heart disease with heart failure: Secondary | ICD-10-CM | POA: Diagnosis not present

## 2021-12-13 DIAGNOSIS — I5022 Chronic systolic (congestive) heart failure: Secondary | ICD-10-CM

## 2021-12-13 DIAGNOSIS — Z8673 Personal history of transient ischemic attack (TIA), and cerebral infarction without residual deficits: Secondary | ICD-10-CM | POA: Insufficient documentation

## 2021-12-13 DIAGNOSIS — R41841 Cognitive communication deficit: Secondary | ICD-10-CM | POA: Diagnosis not present

## 2021-12-13 DIAGNOSIS — Z7901 Long term (current) use of anticoagulants: Secondary | ICD-10-CM | POA: Diagnosis not present

## 2021-12-13 DIAGNOSIS — R278 Other lack of coordination: Secondary | ICD-10-CM | POA: Diagnosis not present

## 2021-12-13 DIAGNOSIS — E785 Hyperlipidemia, unspecified: Secondary | ICD-10-CM | POA: Diagnosis not present

## 2021-12-13 DIAGNOSIS — I5032 Chronic diastolic (congestive) heart failure: Secondary | ICD-10-CM | POA: Diagnosis not present

## 2021-12-13 DIAGNOSIS — R471 Dysarthria and anarthria: Secondary | ICD-10-CM | POA: Insufficient documentation

## 2021-12-13 DIAGNOSIS — T826XXD Infection and inflammatory reaction due to cardiac valve prosthesis, subsequent encounter: Secondary | ICD-10-CM | POA: Diagnosis not present

## 2021-12-13 DIAGNOSIS — I484 Atypical atrial flutter: Secondary | ICD-10-CM | POA: Insufficient documentation

## 2021-12-13 DIAGNOSIS — I69812 Visuospatial deficit and spatial neglect following other cerebrovascular disease: Secondary | ICD-10-CM | POA: Diagnosis not present

## 2021-12-13 DIAGNOSIS — Z7401 Bed confinement status: Secondary | ICD-10-CM | POA: Insufficient documentation

## 2021-12-13 DIAGNOSIS — B952 Enterococcus as the cause of diseases classified elsewhere: Secondary | ICD-10-CM | POA: Insufficient documentation

## 2021-12-13 DIAGNOSIS — Z953 Presence of xenogenic heart valve: Secondary | ICD-10-CM | POA: Insufficient documentation

## 2021-12-13 DIAGNOSIS — I69391 Dysphagia following cerebral infarction: Secondary | ICD-10-CM | POA: Diagnosis not present

## 2021-12-13 DIAGNOSIS — I69354 Hemiplegia and hemiparesis following cerebral infarction affecting left non-dominant side: Secondary | ICD-10-CM | POA: Diagnosis not present

## 2021-12-13 DIAGNOSIS — S065X3S Traumatic subdural hemorrhage with loss of consciousness of 1 hour to 5 hours 59 minutes, sequela: Secondary | ICD-10-CM | POA: Diagnosis not present

## 2021-12-13 DIAGNOSIS — R293 Abnormal posture: Secondary | ICD-10-CM | POA: Diagnosis not present

## 2021-12-13 DIAGNOSIS — R41840 Attention and concentration deficit: Secondary | ICD-10-CM | POA: Diagnosis not present

## 2021-12-13 LAB — MAGNESIUM: Magnesium: 2.4 mg/dL (ref 1.7–2.4)

## 2021-12-13 LAB — BRAIN NATRIURETIC PEPTIDE: B Natriuretic Peptide: 372.1 pg/mL — ABNORMAL HIGH (ref 0.0–100.0)

## 2021-12-13 MED ORDER — TORSEMIDE 20 MG PO TABS: 20.0000 mg | ORAL_TABLET | Freq: Every day | ORAL | 3 refills | Status: DC

## 2021-12-13 MED ORDER — POTASSIUM CHLORIDE ER 10 MEQ PO TBCR: 10.0000 meq | EXTENDED_RELEASE_TABLET | Freq: Every day | ORAL | 3 refills | Status: DC

## 2021-12-13 NOTE — Progress Notes (Signed)
Date:  12/13/2021   ID:  [name] [name], DOB [birth year], MRN [id number]    Provider location: [facility] Type of Visit: Established patient  PCP:  [name] Dad, MD  Cardiologist:  Dr. Aundra [name]   History of Present Illness: [name] [name] is a 81 y.o. male who has a history of rheumatic fever and AI/[name] s/p bioprosthetic AVR and MVR at the [facility] as well as MAZE procedure all in 2006.  He also has an ascending aortic aneurysm.   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  I started him on dronedarone and coumadin with plan for cardioversion after 4 wks of therapeutic anticoagulation if he did not convert back to NSR on his own.  He went back into NSR spontaneously and later stopped coumadin. Atrial flutter recurred in 3/15 and again resolved spontaneously.   In 6/15, patient was admitted with transient dysarthria (resolved in about 10 minutes).  However, he was found to have evidence for CVA by MRI.  Therefore, he was started on coumadin. He was noted to be in atypical atrial flutter again.  Ranolazine was added to dronedarone.     In 8/15, [name] [name] was seen in consultation at the [facility] clinic.  He had atrial flutter ablation at [facility] in 11/15.     He was seen in followup at [facility] in 2/16.  Ranolazine was stopped.  Echo in 2/16 showed EF 55% with stable bioprosthetic mitral and aortic valves.  CTA chest showed stable 5 cm ascending aortic aneurysm.  He went back into atypical atrial flutter in 4/16.  Ranolazine was restarted and he was cardioverted back to NSR.      Cardiolite in 12/18 showed no ischemia. He wore a 30 day monitor in 12/18 with no arrhythmia.     He went into atrial flutter again during a respiratory illness in 2/19.  On 04/24/17, he was cardioverted back to NSR.  He thinks that he went into atrial flutter again later on that day. He was back in atrial flutter at followup  appointment. He was then admitted for Tikosyn loading in 4/19 and was cardioverted back to NSR after the load.    He had atrial fibrillation again in 6/19 with DCCV.    He was seen at the [facility] in 9/19.  Echo showed normal LV and RV systolic function, bioprosthetic MV with mean gradient up to 9 mmHg, normal bioprosthetic AoV.  CTA chest showed aortic root up to 5.3 cm.    He was back in atrial flutter in 12/19, had DCCV back to NSR.  Echo in 12/19 showed EF 60-65%, normal bioprosthetic aortic valve, bioprosthetic mitral valve with mean gradient 4 mmHg and no [name], severe LAE, aortic root 4.8 cm.   He went to the [facility] in 3/20.  Echo at that time showed EF 60-65% with stable bioprosthetic mitral and aortic valves (mean gradient 7 mmHg across MV and 9 mmHg across AoV).  CTA showed 5.0 cm aortic root and ascending aorta, stable.  He returned to the [facility] in 7/20.  He had atrial fibrillation/flutter ablation again and thinks he has been in NSR since that time.  CTA chest showed 5.1 cm ascending thoracic aorta. Seen at [facility] in 7/21.  Echo showed stable bioprosthetic mitral and aortic valves with normal EF.  CTA chest showed 5.0 cm ascending aortic aneurysm.   Last visit  at [facility] 1/22 he was doing well, exercising and euvolemic in NSR.  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 He subsequently developed right MCA territory stroke (as [name] [name] was held) in June 2022, requiring thrombectomy and was discharged to SNF with significant disability.    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 Initial echo showed EF 65-70%, no obvious vegetation. Cards consulted and TEE showed bioprosthetic MV vegetation with trivial [name] and mean gradient 8.  He is not a candidate for mitral valve  replacement.  ID consulted he was started on Rocephin/ampicillin via PICC with a stop date of 01/28/21 then continue on amoxicillin 500 mg bid indefinitely. Hospital course complicated by prolonged encephalopathy.  Neurology consulted and MRI brian showed new left cerebellar vermis CVA, likely septic emboli in the setting of mitral valve endocarditis.  He was discharged back to SNF, weight 187 lbs.  He was readmitted in 5/22 with volume overload and was diuresed. He had transient atrial fibrillation but went back into NSR.   Echo in 1/23 on my review showed  EF 50%, mild-moderate LVH, mildly decreased RV systolic function, bioprosthetic mitral valve appeared to have vegetation present with mild [name], mean gradient 8 mmHg (mild-moderate bioprosthetic valve stenosis), bioprosthetic aortic valve with mean gradient 9 mmHg and no AI, the bioprosthetic aortic valve appeared thickened, 5.1 cm ascending aorta.  Blood cultures were done and showing no growth.  ESR and CRP were normal.  It was suspected that the vegetation noted was sterile.   He returns today for followup of diastolic CHF and mitral valve endocarditis.  Per his wife, he has seemed more short of breath for several weeks. He "breathes heavily" and is now on 2L home oxygen.  CXR on 9/29 showed minimal bibasilar infiltrates.  He was treated with a course of abx, levofloxacin then Augmentin, now back on his suppressive amoxicillin.  He was also started on a course of prednisone.  He has been receiving periodic torsemide, but there has been reticence to use this consistently due to concern for hypernatremia.  Per his wife, he has had a chronic cough. Weight has increased at his SNF.   ECG (personally reviewed): NSR, 1st degree AVB, QTc 464 msec   Labs (3/14): LDL 64, TSH normal, K 3.9, creatinine 1.0     Labs (6/15): LDL 54, HDL 48, creatinine 0.95   Labs (9/15): LDL 54, HDL 25 Labs (10/15): K 4.5, creatinine 1.3, AST 45, ALT 64 Labs (4/16): K 4.3,  creatinine 1.28, HCT 40.7 Labs (5/17): K 4.1, creatinine 1.16, LDL 66, HDL 47 Labs (2/18): LDL 42, HDL 43, K 4.4, creatinine 1.48 Labs (3/18): hgb 13.8 Labs (2/19): LDL 59 Labs (4/19): K 3.9, creatinine 0.97, Mg 2.0 Labs (6/19): K 4.4, creatinine 0.9 Labs (10/19): LDL 84 Labs (12/19): K 4.2, creatinine 1.13, hgb 13.9 Labs (3/20): K 4.1, creatinine 0.98 Labs (6/20): K 4.1, creatinine 0.91, LDL 103  Labs (9/20): K 4, creatinine 0.95 Labs (1/21): K 4, creatinine 0.94, LDL 80 Labs (5/21): K 4.3, creatinine 0.92 Labs (11/22): K 3.2, creatinine 0.6 => 0.88, WBCs 13, ESR 70 Labs (1/23): hgb 10.2, K 4, creatinine 0.6, AST/ALT normal Labs (3/23): ESR 11, CRP 2.7 Labs (4/23): K 4.3, creatinine 0.68 Labs (9/23): K 4.3, creatinine 0.9  PMH: 1. Atrial fibrillation: s/p MAZE and LAA ligation in 2006.  No documented atrial fibrillation since operation.  2. Atypical atrial flutter: 10/13, 3/15, and again in 6/15.  Recurrent 4/16 with DCCV.  - Event monitor (12/18): no significant arrhythmia.  - Recurrence in 2/19: DCCV to NSR but quickly recurred.  - Admitted in 4/19 for Tikosyn loading and cardioverted to NSR.  - DCCV to NSR in 6/19.  - DCCV to NSR in 12/19. - 7/20 redo atrial flutter/fibrillation ablation.   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   - Echo 5/11 with EF 60-65%, normal bioprosthetic aortic valve, normal bioprosthetic mitral valve, moderate LAE.   - Echo 11/12 at [facility] looked ok per report.  - Echo (10/13) with EF 55-60%, mild LVH, normal bioprosthetic aortic and mitral valves, moderate LAE, PA systolic pressure 35 mmHg.   - Echo (6/15) with EF 60%, moderate LVH, normal-appearing bioprosthetic MV and AoV, PA systolic pressure 32 mmHg.   - Echo 9/15 [facility]) with EF 55%,  mild LVH, PASP 52 mmHg, severe LAE, bioprosthetic mitral valve with no [name] and mean gradient 11 mmHg, moderate TR, bioprosthetic aortic valve with no AI and mean gradient 9 mmHg, dilated ascending aorta to 5 cm.   - Echo (2/16, [facility]) with EF 55%, stable bioprosthetic aortic and mitral valves (AoV mean gradient 7 mmHg, MV mean gradient 4 mmHg), normal RV size and systolic function.  - Echo (12/16, [facility]) with EF 58%, mild RV dilation with low normal systolic function, PA systolic pressure 34 mmHg, bioprosthetic MV and AoV functioning normally.  - Echo (5/18, [facility]) with EF 60-65%, RV normal size and systolic function, moderate LAE, bioprosthetic MV mean gradient 6 with no [name], bioprosthetic aortic valve with mean gradient 11 and no AI, PASP 41 mmHg.  - Echo (2/19): EF 60-65%, moderate LVH, bioprosthetic aortic and mitral valves functioning normally, PASP 23 mmHg, aortic root 4.5 cm.  - Echo (9/19): EF 56%, normal RV size and systolic function, bioprosthetic MV with mean gradient 9 mmHg, trivial [name], bioprosthetic aortic valve with mean gradient 12 mmHg, PASP 51 mmHg.  - Echo (12/19): EF 60-65%, normal bioprosthetic aortic valve, bioprosthetic mitral valve with mean gradient 4 mmHg and no [name], severe LAE, aortic root 4.8 cm.  - Echo (3/20, [facility]): EF 60-65%, normal RV, severe LAE, 5.0 cm ascending aorta, bioprosthetic MV with mean gradient 7 mmHg, bioprosthetic aortic valve with mean gradient 9 mmHg.  - Echo (7/20, [facility]): EF 55-60%, moderate septal hypertrophy, normal RV size and systolic function, bioprosthetic MV with trace [name] and mean gradient 4, bioprosthetic aortic valve with mean gradient 14 mmHg, severe LAE.  - Echo (7/21, [facility]): EF 60%, normal RV, s/p bioprosthetic mitral valve with trace [name] and mean gradient 5, s/p bioprosthetic aortic valve with trace AI and mean gradient 15 mmHg, ascending aorta 4.9 cm.  - Echo (10/22): EF 65-70%,  normal RV, bioprosthetic AVR mean gradient 16 mmHg appears normal, bioprosthetic MV mean gradient 10, increased from prior but HR also in 100s when study done.   - TEE 10/22 bioprosthetic MV vegetation with trivial [name] and mean gradient 8.  - Echo (1/23): EF 50%, mild-moderate LVH, mildly decreased RV systolic function, bioprosthetic mitral valve appears to have vegetation present with mild [name], mean gradient 8 mmHg (mild-moderate bioprosthetic valve stenosis), bioprosthetic aortic valve with mean gradient 9 mmHg and no AI, the bioprosthetic  aortic valve appears thickened, 5.1 cm ascending aorta.  4. Hyperlipidemia 5. L-spine surgery 6. LHC with no significant coronary disease in 2006 pre-op.  - Cardiolite (12/18): No ischemia.  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 - SDH 6/22 with craniotomy followed by MCA CVA due to occluded ICA while off [name] [name] (revascularized).  - 10/22: Small left cerebellar infarct possibly embolic from MV endocarditis. 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  CTA chest 9/15 with dilated sinuses of valsalva to 5.0 cm and ascending aorta 4.9 cm. CTA chest (2/16) with 5.0 cm ascending aorta.  CTA chest (12/16) with 4.9 cm aortic root at SOV, 4.8 cm ascending aorta.  - CT chest (5/18, [facility]): 5.0 cm aortic root, 5.0 cm ascending aorta.  - CTA chest (10/19, [facility]): 5.3 cm aortic root, 5.2 cm ascending aorta.  - CTA chest (3/20, [facility]): 5.0 cm aortic root and ascending aorta - CTA chest (7/20, [facility]): 5.1 cm ascending aorta - CTA chest (7/21, [facility]): 5.1 cm aortic root, 5.0 cm ascending aorta.  9. 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 Subdural hematoma 5/22 with craniotomy 12. Right MCA CVA 6/22 in setting of holding [name] [name]. Thrombectomy was done.   Current Outpatient Medications  Medication Sig Dispense Refill   acetaminophen  (TYLENOL) 325 MG tablet Take 650 mg by mouth as needed for mild pain.     Amantadine HCl 100 MG tablet Take 100 mg by mouth 2 (two) times daily.     amLODipine (NORVASC) 5 MG tablet Take 5 mg by mouth daily.     apixaban ([name] [name]) 5 MG TABS tablet Take 1 tablet (5 mg total) by mouth 2 (two) times daily. 60 tablet 0   Artificial Saliva (BIOTENE MOISTURIZING MOUTH MT) Use as directed 2 sprays in the mouth or throat in the morning, at noon, and at bedtime.     atorvastatin (LIPITOR) 80 MG tablet Take 1 tablet (80 mg total) by mouth daily.     dofetilide (TIKOSYN) 125 MCG capsule Take 3 capsules (375 mcg total) by mouth 2 (two) times daily.     finasteride (PROSCAR) 5 MG tablet Take 1 tablet (5 mg total) by mouth daily. 30 tablet 0   lacosamide (VIMPAT) 50 MG TABS tablet Take 1 tablet (50 mg total) by mouth 2 (two) times daily. 60 tablet 3   magnesium gluconate (MAGONATE) 500 MG tablet Take 1 tablet (500 mg total) by mouth daily. 30 tablet 0   melatonin 5 MG TABS Take 5 mg by mouth at bedtime.     methenamine (MANDELAMINE) 1 g tablet Take 1,000 mg by mouth 2 (two) times daily.     methylphenidate (RITALIN) 5 MG tablet Use at 7 AM  and again at 2 PM po 60 tablet 0   Multiple Vitamin (MULTIVITAMIN WITH MINERALS) TABS tablet Take 1 tablet by mouth daily.     omeprazole (PRILOSEC) 20 MG capsule Take 20 mg by mouth daily.     Polyethyl Glycol-Propyl Glycol (SYSTANE) 0.4-0.3 % SOLN Place 2 drops into both eyes daily.     polyethylene glycol (MIRALAX / GLYCOLAX) 17 g packet Take 17 g by mouth every other day. 14 each    predniSONE (DELTASONE) 20 MG tablet Take 1 tablet (20 mg total) by mouth 2 (two) times daily with a meal. 10 tablet 0   senna-docusate (SENOKOT-S) 8.6-50 MG tablet Take 1 tablet by mouth at bedtime as needed.  amoxicillin-clavulanate (AUGMENTIN) 875-125 MG tablet Take 1 tablet by mouth 2 (two) times daily for 7 days. (Patient not taking: Reported on 12/13/2021) 14 tablet 0   bisacodyl  (DULCOLAX) 10 MG suppository Place 1 suppository (10 mg total) rectally daily as needed for moderate constipation or mild constipation.     clobetasol (TEMOVATE) 0.05 % external solution Apply 1 application  topically as needed (itching).     diazePAM, 15 MG Dose, (VALTOCO 15 MG DOSE) 2 x 7.5 MG/0.1ML LQPK Place 7.5 mg into each nostril (total of 15 mg) for grand mal seizure lasting over 5 minutes. Can repeat once in 4 hours. Do Not use more than twice in 24 hours 2 each 1   famotidine (PEPCID) 20 MG tablet Take 20 mg by mouth at bedtime as needed for indigestion.     Glucerna (GLUCERNA) LIQD Take 237 mLs by mouth daily.     hydrocortisone 2.5 % cream Apply 1 application. topically daily as needed (itching).     levalbuterol (XOPENEX) 0.63 MG/3ML nebulizer solution Take 0.63 mg by nebulization every 6 (six) hours as needed for wheezing or shortness of breath.     potassium chloride (KLOR-CON) 10 MEQ tablet Take 1 tablet (10 mEq total) by mouth daily. 90 tablet 3   torsemide (DEMADEX) 20 MG tablet Take 1 tablet (20 mg total) by mouth daily. 90 tablet 3   No current facility-administered medications for this encounter.    Allergies:   Naproxen and No healthtouch food allergies   Social History:  The patient  reports that he has never smoked. He has never used smokeless tobacco. He reports that he does not drink alcohol and does not use drugs.   Family History:  The patient's family history includes Cancer in his father; Diabetes in his brother and paternal aunt; Heart attack in his maternal grandfather; Macular degeneration in his mother.   ROS:  Please see the history of present illness.   All other systems are personally reviewed and negative.   Exam:   BP 120/70   Pulse 71   SpO2 98%  General: NAD Neck: JVP 8-9 cm, no thyromegaly or thyroid nodule.  Lungs: Mild crackles at bases.  CV: Nondisplaced PMI.  Heart regular S1/S2, no S3/S4, 2/6 SEM RUSB with 2/6 early diastolic murmur.  Trace  ankle edema.  No carotid bruit.  Normal pedal pulses.  Abdomen: Soft, nontender, no hepatosplenomegaly, no distention.  Skin: Intact without lesions or rashes.  Neurologic: Alert and oriented x 3.  Psych: Normal affect. Extremities: No clubbing or cyanosis.  HEENT: Normal.   Wt Readings from Last 3 Encounters:  12/09/21 67.9 kg (149 lb 11.2 oz)  12/06/21 65.7 kg (144 lb 12.8 oz)  12/03/21 68 kg (150 lb)    ASSESSMENT AND PLAN 1. Chronic Diastolic Heart Failure: Echo in 1/23 on my review showed EF 50%, mildly dysfunctional RV. He has been "breathing heavily" and now has an oxygen requirement.  He looks mildly volume overloaded on exam.  - I think that he will need a regular diuretic, will need to take torsemide 20 mg daily.  BMET/BNP today and repeat BMET in 10 days.  - Add KCl 10 daily.  - There is also concern for aspiration causing his breathing difficulty, agree with CT chest w/o contrast (ordered).  2. Neuro: Patient is s/p SDH with craniotomy followed by MCA CVA due to occluded ICA while off [name] [name] (revascularized).  Baseline, he is bed-bound, left hemiparesis.   Also in 10/22 had  new small left cerebellar infarct possibly embolic from endocarditis.  He is awake but minimally interactive today with me.  3. Enterococcal bacteremia: Source uncertain.  No PNA noted on CXR.  Enterococcus did not grow in urine.  Patient had bioprosthetic MV endocarditis by 10/22 TEE, valve function not markedly abnormal (mean gradient elevated at 8 with trivial [name]).  He had a new left cerebellar CVA in 10/22 (small) that may have been embolic from MV vegetation.  With prior sternotomy and minimal mobility, he is not a candidate for redo valve surgery.  He completed 6 week course of ceftriaxone/ampicillin.  My review of 1/23 echo showed vegetation still present on mitral valve with mean gradient 8 mmHg and mild [name], and the aortic valve is thickened but not definitive vegetation. Blood cultures in 1/23 were  negative, and ESR and CRP were normal.  I suspect that the vegetation was sterile. I think that I hear a diastolic murmur today on exam.  - I will arrange for repeat echo.  - Continue amoxicillin long-term.  4. 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  Echo in 1/23 showed bioprosthetic AVR mean gradient 9 mmHg with thickening present, bioprosthetic MV mean gradient 8 with vegetation still noted to be present. As above, I heard a diastolic murmur today on exam.  - Repeat echo.  5. Atrial fibrillation/Flutter: Maze in 2006 + 2 flutter/fib ablations at [facility]. Remains on decreased Tikosyn 375 mcg bid due to previously long QT. He is in NSR today with stable QTc.  - Continue Tikosyn 375 mcg bid. BMET today.  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  - Continue [name] [name].  6. Hyperlipidemia: He is on atorvastatin.  7. HTN: BP stable.   - Continue amlodipine 5 mg daily.   Followup in 3 wks with echo  Loralie Champagne 12/13/2021  [facility] [address] [facility] [place] [address] [phone] (office) [phone] (fax)

## 2021-12-13 NOTE — Patient Instructions (Signed)
CHANGE Torsemide to 20 mg daily  CHANGE Potassium to 10 meq daily.  Labs done today, your results will be available in MyChart, we will contact you for abnormal readings.   Repeat blood work in a week  Please have CT chest scheduled as soon as you can.  Your physician has requested that you have an echocardiogram. Echocardiography is a painless test that uses sound waves to create images of your heart. It provides your doctor with information about the size and shape of your heart and how well your heart's chambers and valves are working. This procedure takes approximately one hour. There are no restrictions for this procedure.  Your physician recommends that you schedule a follow-up appointment in: 3 weeks  If you have any questions or concerns before your next appointment please send [place] a message through [place] or call our office at [phone].    TO LEAVE A MESSAGE FOR THE NURSE SELECT OPTION 2, PLEASE LEAVE A MESSAGE INCLUDING: YOUR NAME DATE OF BIRTH CALL BACK NUMBER REASON FOR CALL**this is important as we prioritize the call backs  YOU WILL RECEIVE A CALL BACK THE SAME DAY AS LONG AS YOU CALL BEFORE 4:00 PM  At the [facility], you and your health needs are our priority. As part of our continuing mission to provide you with exceptional heart care, we have created designated Provider Care Teams. These Care Teams include your primary Cardiologist (physician) and Advanced Practice Providers (APPs- Physician Assistants and Nurse Practitioners) who all work together to provide you with the care you need, when you need it.   You may see any of the following providers on your designated Care Team at your next follow up: Dr Glori Bickers Dr Loralie Champagne Dr. Roxana Hires, NP Lyda Jester, [place] [facility] [place], [place] Forestine Na, NP Audry Riles, PharmD   Please be sure to bring in all your medications bottles to every  appointment.

## 2021-12-14 ENCOUNTER — Ambulatory Visit (HOSPITAL_BASED_OUTPATIENT_CLINIC_OR_DEPARTMENT_OTHER)
Admission: RE | Admit: 2021-12-14 | Discharge: 2021-12-14 | Disposition: A | Discharge status: Home / Self Care | Payer: Medicare Other | Source: Ambulatory Visit | Attending: Cardiology | Admitting: Cardiology

## 2021-12-14 ENCOUNTER — Encounter: Payer: Self-pay | Admitting: Internal Medicine

## 2021-12-14 DIAGNOSIS — J9601 Acute respiratory failure with hypoxia: Secondary | ICD-10-CM | POA: Diagnosis not present

## 2021-12-14 DIAGNOSIS — R293 Abnormal posture: Secondary | ICD-10-CM | POA: Diagnosis not present

## 2021-12-14 DIAGNOSIS — I69391 Dysphagia following cerebral infarction: Secondary | ICD-10-CM | POA: Diagnosis not present

## 2021-12-14 DIAGNOSIS — I69354 Hemiplegia and hemiparesis following cerebral infarction affecting left non-dominant side: Secondary | ICD-10-CM | POA: Diagnosis not present

## 2021-12-14 DIAGNOSIS — I5022 Chronic systolic (congestive) heart failure: Secondary | ICD-10-CM | POA: Diagnosis not present

## 2021-12-14 DIAGNOSIS — R41840 Attention and concentration deficit: Secondary | ICD-10-CM | POA: Diagnosis not present

## 2021-12-14 DIAGNOSIS — J90 Pleural effusion, not elsewhere classified: Secondary | ICD-10-CM | POA: Diagnosis not present

## 2021-12-14 DIAGNOSIS — R41841 Cognitive communication deficit: Secondary | ICD-10-CM | POA: Diagnosis not present

## 2021-12-14 DIAGNOSIS — R278 Other lack of coordination: Secondary | ICD-10-CM | POA: Diagnosis not present

## 2021-12-14 DIAGNOSIS — G4089 Other seizures: Secondary | ICD-10-CM | POA: Diagnosis not present

## 2021-12-14 DIAGNOSIS — H538 Other visual disturbances: Secondary | ICD-10-CM | POA: Diagnosis not present

## 2021-12-14 DIAGNOSIS — M6389 Disorders of muscle in diseases classified elsewhere, multiple sites: Secondary | ICD-10-CM | POA: Diagnosis not present

## 2021-12-14 DIAGNOSIS — I69812 Visuospatial deficit and spatial neglect following other cerebrovascular disease: Secondary | ICD-10-CM | POA: Diagnosis not present

## 2021-12-14 DIAGNOSIS — S065X3S Traumatic subdural hemorrhage with loss of consciousness of 1 hour to 5 hours 59 minutes, sequela: Secondary | ICD-10-CM | POA: Diagnosis not present

## 2021-12-14 LAB — BASIC METABOLIC PANEL
BUN: 45 — AB (ref 4–21)
CO2: 23 — AB (ref 13–22)
Chloride: 108 (ref 99–108)
Creatinine: 1 (ref 0.6–1.3)
Glucose: 144
Potassium: 4.8 mEq/L (ref 3.5–5.1)
Sodium: 148 — AB (ref 137–147)

## 2021-12-14 LAB — COMPREHENSIVE METABOLIC PANEL
Calcium: 10.1 (ref 8.7–10.7)
eGFR: 78

## 2021-12-15 ENCOUNTER — Non-Acute Institutional Stay (SKILLED_NURSING_FACILITY): Payer: Medicare Other | Admitting: Orthopedic Surgery

## 2021-12-15 ENCOUNTER — Encounter: Payer: Self-pay | Admitting: Internal Medicine

## 2021-12-15 ENCOUNTER — Encounter: Payer: Self-pay | Admitting: Orthopedic Surgery

## 2021-12-15 DIAGNOSIS — I69391 Dysphagia following cerebral infarction: Secondary | ICD-10-CM | POA: Diagnosis not present

## 2021-12-15 DIAGNOSIS — I69812 Visuospatial deficit and spatial neglect following other cerebrovascular disease: Secondary | ICD-10-CM | POA: Diagnosis not present

## 2021-12-15 DIAGNOSIS — I5033 Acute on chronic diastolic (congestive) heart failure: Secondary | ICD-10-CM | POA: Diagnosis not present

## 2021-12-15 DIAGNOSIS — I69354 Hemiplegia and hemiparesis following cerebral infarction affecting left non-dominant side: Secondary | ICD-10-CM | POA: Diagnosis not present

## 2021-12-15 DIAGNOSIS — G4089 Other seizures: Secondary | ICD-10-CM | POA: Diagnosis not present

## 2021-12-15 DIAGNOSIS — M6389 Disorders of muscle in diseases classified elsewhere, multiple sites: Secondary | ICD-10-CM | POA: Diagnosis not present

## 2021-12-15 DIAGNOSIS — R278 Other lack of coordination: Secondary | ICD-10-CM | POA: Diagnosis not present

## 2021-12-15 DIAGNOSIS — R41840 Attention and concentration deficit: Secondary | ICD-10-CM | POA: Diagnosis not present

## 2021-12-15 DIAGNOSIS — H538 Other visual disturbances: Secondary | ICD-10-CM | POA: Diagnosis not present

## 2021-12-15 DIAGNOSIS — S065X3S Traumatic subdural hemorrhage with loss of consciousness of 1 hour to 5 hours 59 minutes, sequela: Secondary | ICD-10-CM | POA: Diagnosis not present

## 2021-12-15 DIAGNOSIS — I5022 Chronic systolic (congestive) heart failure: Secondary | ICD-10-CM | POA: Diagnosis not present

## 2021-12-15 DIAGNOSIS — E870 Hyperosmolality and hypernatremia: Secondary | ICD-10-CM

## 2021-12-15 DIAGNOSIS — Z7189 Other specified counseling: Secondary | ICD-10-CM | POA: Diagnosis not present

## 2021-12-15 DIAGNOSIS — R293 Abnormal posture: Secondary | ICD-10-CM | POA: Diagnosis not present

## 2021-12-15 DIAGNOSIS — R41841 Cognitive communication deficit: Secondary | ICD-10-CM | POA: Diagnosis not present

## 2021-12-15 NOTE — Progress Notes (Signed)
Location:  [facility] Room Number: 127/A Place of Service:  SNF [phone]) Provider:  Yvonna Alanis, NP   [name], MD  Patient Care Team: [name], MD as PCP - General (Internal Medicine) Larey Dresser, MD as PCP - Cardiology (Cardiology) [name], MD as Consulting Physician (Ophthalmology) [name], MD (Internal Medicine)  Extended Emergency Contact Information Primary Emergency Contact: [name] Address: [address]          [place], [place] [address] [place] Phone: [phone] Mobile Phone: [phone] Relation: Spouse Secondary Emergency Contact: [name], [place] [address] [name] of [place] Phone: [phone] Relation: Son  Code Status:  DNR Goals of care: Advanced Directive information    09/13/2021   10:08 AM  Advanced Directives  Does Patient Have a Medical Advance Directive? Yes  Type of Paramedic of [place];Living will;Out of facility DNR (pink MOST or yellow form)  Does patient want to make changes to medical advance directive? No - Patient declined  Copy of [place] in Chart? Yes - validated most recent copy scanned in chart (See row information)     Chief Complaint  Patient presents with   Acute Visit    hyponatremia    HPI:  Pt is a 81 y.o. male seen today for acute visit due to hypernatremia.   He currently resides on the skilled nursing unit at [facility] 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   Weekly labs reveal Na+ 148 (10/10)> was 145 (10/02).   Increased weight gain and sob x 1 month. Echo 03/2021 EF  45-50%. Evaluated by cardiology 10/09. CT chest pending. He was started on torsemide and KCL daily. Today's weight 138.4 lbs, was 143 (10/10). He remains on oxygen, O2 sats > 90%. Respirations improved. He was given prednisone x 5 days- completed 10/10.   He is nonverbal and unable to express needs. Discussed reduced quality of life and goals of care with wife. Briefly discussed comfort based care. I encouraged her to think about his overall prognosis with multiple health conditions with regards to aggressive measures/current plan of care.   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    Other and unspecified hyperlipidemia    Pre-diabetes    Previous back surgery 1978, jan 2007   Psychosexual dysfunction with inhibited sexual excitement    SOB (shortness of breath)    "with heavy exercise"   Stroke ([place]) 08/2013   "I WAS IN AFIB AND THREW A CLOT, THE EFFECTS WERE TRANSITORY AND DIDNT LAST BUT FOR 30 MINUTES"  Thoracic aortic aneurysm [facility])    Past Surgical History:  Procedure Laterality Date   AORTIC AND MITRAL VALVE REPLACEMENT     09/2004   CARDIOVERSION     3 times from 2004-2006   CARDIOVERSION N/A 09/26/2013   Procedure: CARDIOVERSION;  Surgeon: Larey Dresser, MD;  Location: [facility];  Service: Cardiovascular;  Laterality: N/A;   CARDIOVERSION N/A 06/19/2014   Procedure: CARDIOVERSION;  Surgeon: Jerline Pain, MD;  Location: [facility];  Service: Cardiovascular;  Laterality: N/A;    CARDIOVERSION N/A 04/24/2017   Procedure: CARDIOVERSION;  Surgeon: Larey Dresser, MD;  Location: [facility];  Service: Cardiovascular;  Laterality: N/A;   CARDIOVERSION N/A 06/08/2017   Procedure: CARDIOVERSION;  Surgeon: Pixie Casino, MD;  Location: [facility] ENDOSCOPY;  Service: Cardiovascular;  Laterality: N/A;   CARDIOVERSION N/A 08/24/2017   Procedure: CARDIOVERSION;  Surgeon: Skeet Latch, MD;  Location: [facility] ENDOSCOPY;  Service: Cardiovascular;  Laterality: N/A;   CARDIOVERSION N/A 02/14/2018   Procedure: CARDIOVERSION;  Surgeon: Larey Dresser, MD;  Location: [facility] ENDOSCOPY;  Service: Cardiovascular;  Laterality: N/A;   COLONOSCOPY     CRANIOTOMY N/A 07/28/2020   Procedure: CRANIOTOMY FOR EVACUATION OF SUBDURAL HEMATOMA;  Surgeon: Eustace Mehan, MD;  Location: [facility];  Service: Neurosurgery;  Laterality: N/A;   IR ANGIO VERTEBRAL SEL SUBCLAVIAN INNOMINATE UNI R MOD SED  08/07/2020   IR CT HEAD LTD  08/07/2020   IR PERCUTANEOUS ART THROMBECTOMY/INFUSION INTRACRANIAL INC DIAG ANGIO  08/07/2020   laminectomies     10/1975 and in 03/2005   RADIOLOGY WITH ANESTHESIA N/A 08/07/2020   Procedure: IR WITH ANESTHESIA;  Surgeon: Luanne Bras, MD;  Location: [facility];  Service: Radiology;  Laterality: N/A;   TEE WITHOUT CARDIOVERSION N/A 12/18/2020   Procedure: TRANSESOPHAGEAL ECHOCARDIOGRAM (TEE);  Surgeon: Larey Dresser, MD;  Location: [facility] ENDOSCOPY;  Service: Cardiovascular;  Laterality: N/A;   [place] Right 04/03/2018   Procedure: TOTAL HIP ARTHROPLASTY ANTERIOR APPROACH;  Surgeon: Paralee Cancel, MD;  Location: [facility];  Service: Orthopedics;  Laterality: Right;  70 mins    Allergies  Allergen Reactions   Naproxen Hives   No [facility] Food Allergies Other (See Comments)    Scallops - distress, nausea and vomitting    Outpatient Encounter Medications as of 12/15/2021  Medication Sig   acetaminophen (TYLENOL) 325 MG tablet Take 650 mg by mouth as  needed for mild pain.   Amantadine HCl 100 MG tablet Take 100 mg by mouth 2 (two) times daily.   amLODipine (NORVASC) 5 MG tablet Take 5 mg by mouth daily.   apixaban (ELIQUIS) 5 MG TABS tablet Take 1 tablet (5 mg total) by mouth 2 (two) times daily.   Artificial Saliva (BIOTENE MOISTURIZING MOUTH MT) Use as directed 2 sprays in the mouth or throat in the morning, at noon, and at bedtime.   atorvastatin (LIPITOR) 80 MG tablet Take 1 tablet (80 mg total) by mouth daily.   bisacodyl (DULCOLAX) 10 MG suppository Place 1 suppository (10 mg total) rectally daily as needed for moderate constipation or mild constipation.   clobetasol (TEMOVATE) 0.05 % external solution Apply 1 application  topically as needed (itching).   diazePAM, 15 MG Dose, (VALTOCO 15 MG DOSE) 2 x 7.5 MG/0.1ML LQPK Place 7.5 mg into each nostril (total of 15 mg) for grand mal seizure lasting over 5 minutes. Can repeat once in 4 hours. Do Not use more than twice in  24 hours   dofetilide (TIKOSYN) 125 MCG capsule Take 3 capsules (375 mcg total) by mouth 2 (two) times daily.   famotidine (PEPCID) 20 MG tablet Take 20 mg by mouth at bedtime as needed for indigestion.   finasteride (PROSCAR) 5 MG tablet Take 1 tablet (5 mg total) by mouth daily.   Glucerna (GLUCERNA) LIQD Take 237 mLs by mouth daily.   hydrocortisone 2.5 % cream Apply 1 application. topically daily as needed (itching).   lacosamide (VIMPAT) 50 MG TABS tablet Take 1 tablet (50 mg total) by mouth 2 (two) times daily.   levalbuterol (XOPENEX) 0.63 MG/3ML nebulizer solution Take 0.63 mg by nebulization every 6 (six) hours as needed for wheezing or shortness of breath.   magnesium gluconate (MAGONATE) 500 MG tablet Take 1 tablet (500 mg total) by mouth daily.   melatonin 5 MG TABS Take 5 mg by mouth at bedtime.   methenamine (MANDELAMINE) 1 g tablet Take 1,000 mg by mouth 2 (two) times daily.   methylphenidate (RITALIN) 5 MG tablet Use at 7 AM  and again at 2 PM po   Multiple  Vitamin (MULTIVITAMIN WITH MINERALS) TABS tablet Take 1 tablet by mouth daily.   omeprazole (PRILOSEC) 20 MG capsule Take 20 mg by mouth daily.   Polyethyl Glycol-Propyl Glycol (SYSTANE) 0.4-0.3 % SOLN Place 2 drops into both eyes daily.   polyethylene glycol (MIRALAX / GLYCOLAX) 17 g packet Take 17 g by mouth every other day.   potassium chloride (KLOR-CON) 10 MEQ tablet Take 1 tablet (10 mEq total) by mouth daily.   predniSONE (DELTASONE) 20 MG tablet Take 1 tablet (20 mg total) by mouth 2 (two) times daily with a meal.   senna-docusate (SENOKOT-S) 8.6-50 MG tablet Take 1 tablet by mouth at bedtime as needed.   torsemide (DEMADEX) 20 MG tablet Take 1 tablet (20 mg total) by mouth daily.   No facility-administered encounter medications on file as of 12/15/2021.    Review of Systems  Unable to perform ROS: Patient nonverbal    Immunization History  Administered Date(s) Administered   Fluad Quad(high Dose 65+) 12/06/2018, 12/09/2020   Influenza Whole 12/25/2008, 01/05/2010, 12/30/2011   Influenza, High Dose Seasonal PF 12/14/2016   Influenza,inj,Quad PF,6+ Mos 12/19/2013   Influenza,inj,quad, With Preservative 11/22/2017   Influenza-Unspecified 12/14/2015, 12/24/2019   Moderna Covid-19 Vaccine Bivalent Booster 22yr & up 01/12/2021   Moderna SARS-COV2 Booster Vaccination 01/02/2020, 06/26/2020   Moderna Sars-Covid-2 Vaccination 04/08/2019, 05/05/2019   Pneumococcal Conjugate-13 12/19/2013   Pneumococcal Polysaccharide-23 09/24/2007, 05/22/2017   Td 12/25/2008   Tdap 03/22/2018   Zoster Recombinat (Shingrix) 07/16/2019, 10/28/2019   Zoster, Live 09/24/2007   Pertinent  Health Maintenance Due  Topic Date Due   INFLUENZA VACCINE  10/05/2021   OPHTHALMOLOGY EXAM  10/06/2022 (Originally 10/04/1950)   HEMOGLOBIN A1C  01/05/2022   FOOT EXAM  10/23/2022   COLONOSCOPY (Pts 45-443yrInsurance coverage will need to be confirmed)  Discontinued      08/10/2021   11:10 PM 08/11/2021    8:00  AM 08/11/2021    7:30 PM 08/12/2021    8:00 AM 10/13/2021    2:32 PM  [place] the past year?     0  Patient Fall Risk Level High fall risk High fall risk High fall risk High fall risk    Functional Status Survey:    Vitals:   12/15/21 1505  BP: 120/80  Pulse: 80  Resp: 18  Temp: (!) 97.3 F (36.3 C)  SpO2: 99%  Weight: 138 lb 6.4 oz (62.8 kg)  Height: '5\' 7"'$  (1.702 m)   Body mass index is 21.68 kg/m. Physical Exam Vitals reviewed.  Constitutional:      General: He is not in acute distress. Eyes:     General:        Right eye: No discharge.        Left eye: No discharge.  Cardiovascular:     Rate and Rhythm: Normal rate. Rhythm irregular.     Pulses: Normal pulses.     Heart sounds: Normal heart sounds.  Pulmonary:     Effort: Pulmonary effort is normal. No respiratory distress.     Breath sounds: Normal breath sounds. No wheezing or rales.  Abdominal:     General: Bowel sounds are normal. There is no distension.     Palpations: Abdomen is soft.     Tenderness: There is no abdominal tenderness.  Musculoskeletal:     Cervical back: Neck supple.     Right lower leg: Edema present.     Left lower leg: Edema present.     Comments: BLE trace edema  Skin:    General: Skin is warm and dry.     Capillary Refill: Capillary refill takes less than 2 seconds.  Neurological:     General: No focal deficit present.     Mental Status: He is easily aroused. Mental status is at baseline.     Motor: Weakness present.     Gait: Gait abnormal.  Psychiatric:     Comments: Nonverbal, unable to express needs, alert to self     Labs reviewed: Recent Labs    12/18/20 1630 12/19/20 0610 12/19/20 1632 12/20/20 0153 08/10/21 0937 08/10/21 0957 08/11/21 0408 08/12/21 0107 08/16/21 0000 11/29/21 0000 12/06/21 0000 12/13/21 1157 12/14/21 0000  NA  --  139  --    < > 141   < > 140 142   < > 140 145  --  148*  K  --  3.5  --    < > 3.8   < > 3.6 3.9   < > 4.3 4.1  --   4.8  CL  --  108  --    < > 111  --  109 113*   < > 106 110*  --  108  CO2  --  23  --    < > 16*  --  24 23   < > 22 21  --  23*  GLUCOSE  --  186*  --    < > 148*  --  120* 143*  --   --   --   --   --   BUN  --  16  --    < > 22  --  17 17   < > 31* 33*  --  45*  CREATININE  --  0.76  --    < > 1.18  --  0.81 0.83   < > 0.9 0.9  --  1.0  CALCIUM  --  7.7*  --    < > 8.8*  --  8.6* 8.7*   < > 9.3 9.3  --  10.1  MG 1.9 2.0 1.9   < >  --   --  1.9 2.0  --   --   --  2.4  --   PHOS 3.1 2.7 2.4*  --   --   --   --   --   --   --   --   --   --    < > =  values in this interval not displayed.   Recent Labs    07/12/21 1056 08/10/21 0937 08/11/21 0408 09/07/21 0000 09/08/21 0000  AST '22 29 27 24 24  '$ ALT '22 26 23 18 18  '$ ALKPHOS  --  93 80 105 105  BILITOT 0.7 0.5 0.8  --   --   PROT 7.2 6.4* 5.8*  --   --   ALBUMIN  --  3.3* 3.1* 3.5 3.5   Recent Labs    07/12/21 1056 08/10/21 0937 08/10/21 0957 08/11/21 0408 08/12/21 0107 09/07/21 0000 11/01/21 0000 11/08/21 0000 12/06/21 0000  WBC 9.0 16.1*  --  9.1 10.2   < > 8.9 7.9 8.1  NEUTROABS 6,804 14.3*  --   --  7.8*  --   --   --   --   HGB 10.9* 10.0*   < > 9.4* 9.4*   < > 9.4* 9.9* 9.2*  HCT 33.9* 31.6*   < > 28.7* 29.8*   < > 28* 30* 28*  MCV 90.4 96.3  --  92.0 94.3  --   --   --   --   PLT 426* 238  --  218 227   < > 258 295 338   < > = values in this interval not displayed.   Lab Results  Component Value Date   TSH 1.773 01/24/2021   Lab Results  Component Value Date   HGBA1C 5.7 07/05/2021   Lab Results  Component Value Date   CHOL 110 07/05/2021   HDL 43 07/05/2021   LDLCALC 57 07/05/2021   TRIG 51 07/05/2021   CHOLHDL 2.8 08/08/2020    Significant Diagnostic Results in last 30 days:  No results found.  Assessment/Plan 1. Hypernatremia - Na+ 148 (10/10)> was 145 (10/02) - encourage oral hydration with water - bmp 12/16/2021  2. Acute on chronic diastolic CHF (congestive heart failure) ([place]) -  followed by cardiology - increased weight gain/sob x 1 month - started on torsemide/KCL daily - weights have been trending down, now 138.4 lbs > was 143 lbs - CT chest pending -f/u with cardiology in 3 weeks  3. Goals of care, counseling/discussion - reduced quality of life with multiple health conditions - recommend comfort based approach - no changes to plan of care at this time    Family/ staff Communication: plan discussed with wife/HPOA and nursing  Labs/tests ordered:  bmp 12/16/2021

## 2021-12-16 ENCOUNTER — Encounter: Payer: Self-pay | Admitting: Adult Health

## 2021-12-16 ENCOUNTER — Non-Acute Institutional Stay (SKILLED_NURSING_FACILITY): Payer: Medicare Other | Admitting: Adult Health

## 2021-12-16 DIAGNOSIS — E870 Hyperosmolality and hypernatremia: Secondary | ICD-10-CM | POA: Diagnosis not present

## 2021-12-16 DIAGNOSIS — I69354 Hemiplegia and hemiparesis following cerebral infarction affecting left non-dominant side: Secondary | ICD-10-CM | POA: Diagnosis not present

## 2021-12-16 DIAGNOSIS — J9600 Acute respiratory failure, unspecified whether with hypoxia or hypercapnia: Secondary | ICD-10-CM | POA: Diagnosis not present

## 2021-12-16 DIAGNOSIS — S065X3S Traumatic subdural hemorrhage with loss of consciousness of 1 hour to 5 hours 59 minutes, sequela: Secondary | ICD-10-CM | POA: Diagnosis not present

## 2021-12-16 DIAGNOSIS — I69391 Dysphagia following cerebral infarction: Secondary | ICD-10-CM | POA: Diagnosis not present

## 2021-12-16 DIAGNOSIS — R569 Unspecified convulsions: Secondary | ICD-10-CM

## 2021-12-16 DIAGNOSIS — I5022 Chronic systolic (congestive) heart failure: Secondary | ICD-10-CM | POA: Diagnosis not present

## 2021-12-16 DIAGNOSIS — R278 Other lack of coordination: Secondary | ICD-10-CM | POA: Diagnosis not present

## 2021-12-16 DIAGNOSIS — R293 Abnormal posture: Secondary | ICD-10-CM | POA: Diagnosis not present

## 2021-12-16 DIAGNOSIS — M6389 Disorders of muscle in diseases classified elsewhere, multiple sites: Secondary | ICD-10-CM | POA: Diagnosis not present

## 2021-12-16 DIAGNOSIS — G4089 Other seizures: Secondary | ICD-10-CM | POA: Diagnosis not present

## 2021-12-16 DIAGNOSIS — H538 Other visual disturbances: Secondary | ICD-10-CM | POA: Diagnosis not present

## 2021-12-16 DIAGNOSIS — R41841 Cognitive communication deficit: Secondary | ICD-10-CM | POA: Diagnosis not present

## 2021-12-16 DIAGNOSIS — I69812 Visuospatial deficit and spatial neglect following other cerebrovascular disease: Secondary | ICD-10-CM | POA: Diagnosis not present

## 2021-12-16 DIAGNOSIS — R41840 Attention and concentration deficit: Secondary | ICD-10-CM | POA: Diagnosis not present

## 2021-12-16 NOTE — Progress Notes (Addendum)
Location:  Occupational psychologist of Service:  SNF (31) Provider:   Cindi Carbon, [place] [phone]   [name], MD  Patient Care Team: [name], MD as PCP - General (Internal Medicine) Larey Dresser, MD as PCP - Cardiology (Cardiology) [name], MD as Consulting Physician (Ophthalmology) [name], MD (Internal Medicine)  Extended Emergency Contact Information Primary Emergency Contact: [name] Address: [address]          [place], [place] [address] [place] Phone: [phone] Mobile Phone: [phone] Relation: Spouse Secondary Emergency Contact: [name], [place] [address] [name] of [place] Phone: [phone] Relation: Son  Code Status:  DNR Goals of care: Advanced Directive information    09/13/2021   10:08 AM  Advanced Directives  Does Patient Have a Medical Advance Directive? Yes  Type of Paramedic of [place];Living will;Out of facility DNR (pink MOST or yellow form)  Does patient want to make changes to medical advance directive? No - Patient declined  Copy of [place] in Chart? Yes - validated most recent copy scanned in chart (See row information)     Chief Complaint  Patient presents with   Seizures    HPI:  Pt is a 81 y.o. male seen today for an acute visit for seizure.   PMH includes syncope with fall in May of 2022, TBI with SDH with craniotomy, acute right MCA s/p thrombectomy, bioprosthetic mitral and aortic valve replacement, DM II, diastiolic CHF, BPH, and afib.  On life long amoxicillin for endocarditis Echo 03/31/21 EF 45-50%  Nurse reports that he had a seizure lasting 6 min where his left side of his face was drooping, right side of face tight/spasms, then followed by facial twitching. He was given Valtco nasal spray and symptoms resolved. He is post ictal now,  sleeping. Face is symmetric. This is the third seizure in the past month He is on vimpat dose was previously reduced due to lethargy in June of 2023.  He was started on Ritalin for lethargy related to his TBI in August of 2023. He has been more alert since starting the new med.   Also seen by cardiology Dr [name] 12/13/21 felt to have systolic CHF, torsemide increased to daily.  CT of the chest non contrast 12/14/21 showed congestive heart failure, cardiomegaly, small right effusion, and dilatation of the pulmonary trunk 4.8 cm suggestive pulmonary htn.  BNP 372.1 on 12/13/21 He has lost 10+ lbs in the past 4 days. NA was 148 on 10/11.  Repeat NA 145 10/12. Staff has been encouraging water. The staff do not think his work of breathing improved. He remains on 2 liters, sats have been in the high 90's. Oxygen left on due to increased WOB. Does not appear to have pain.  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    Other and unspecified hyperlipidemia    Pre-diabetes    Previous back surgery 1978, jan 2007   Psychosexual dysfunction with inhibited sexual excitement    SOB (shortness of breath)    "with heavy exercise"   Stroke ([place]) 08/2013   "I WAS IN AFIB AND THREW A CLOT, THE EFFECTS WERE TRANSITORY AND DIDNT LAST BUT FOR 30 MINUTES"    Thoracic aortic aneurysm [facility])    Past Surgical History:  Procedure Laterality Date   AORTIC AND MITRAL VALVE REPLACEMENT     09/2004   CARDIOVERSION     3 times from  2004-2006   CARDIOVERSION N/A 09/26/2013   Procedure: CARDIOVERSION;  Surgeon: Larey Dresser, MD;  Location: [place];  Service: Cardiovascular;  Laterality: N/A;   CARDIOVERSION N/A 06/19/2014   Procedure: CARDIOVERSION;  Surgeon: Jerline Pain, MD;  Location: [place];  Service: Cardiovascular;  Laterality: N/A;   CARDIOVERSION N/A 04/24/2017   Procedure: CARDIOVERSION;  Surgeon: Larey Dresser, MD;  Location: [place];  Service: Cardiovascular;  Laterality: N/A;   CARDIOVERSION N/A 06/08/2017   Procedure: CARDIOVERSION;  Surgeon: Pixie Casino, MD;  Location: [facility] ENDOSCOPY;  Service: Cardiovascular;  Laterality: N/A;   CARDIOVERSION N/A 08/24/2017   Procedure: CARDIOVERSION;  Surgeon: Skeet Latch, MD;  Location: [facility] ENDOSCOPY;  Service: Cardiovascular;  Laterality: N/A;   CARDIOVERSION N/A 02/14/2018   Procedure: CARDIOVERSION;  Surgeon: Larey Dresser, MD;  Location: [facility] ENDOSCOPY;  Service: Cardiovascular;  Laterality: N/A;   COLONOSCOPY     CRANIOTOMY N/A 07/28/2020   Procedure: CRANIOTOMY FOR EVACUATION OF SUBDURAL HEMATOMA;  Surgeon: Eustace Lange, MD;  Location: [place];  Service: Neurosurgery;  Laterality: N/A;   IR ANGIO VERTEBRAL SEL SUBCLAVIAN INNOMINATE UNI R MOD SED  08/07/2020   IR CT HEAD LTD  08/07/2020   IR PERCUTANEOUS ART THROMBECTOMY/INFUSION INTRACRANIAL INC DIAG ANGIO  08/07/2020   laminectomies     10/1975 and in 03/2005   RADIOLOGY WITH ANESTHESIA N/A 08/07/2020   Procedure: IR WITH ANESTHESIA;  Surgeon: Luanne Bras, MD;  Location: [place];  Service: Radiology;  Laterality: N/A;   TEE WITHOUT CARDIOVERSION N/A 12/18/2020   Procedure: TRANSESOPHAGEAL ECHOCARDIOGRAM (TEE);  Surgeon: Larey Dresser, MD;  Location: [facility] ENDOSCOPY;  Service: Cardiovascular;  Laterality: N/A;   [place] Right 04/03/2018   Procedure: TOTAL HIP ARTHROPLASTY ANTERIOR APPROACH;  Surgeon: Paralee Cancel, MD;  Location: [facility] ORS;  Service:  Orthopedics;  Laterality: Right;  70 mins    Allergies  Allergen Reactions   Naproxen Hives   No [facility] Food Allergies Other (See Comments)    Scallops - distress, nausea and vomitting    Outpatient Encounter Medications as of 12/16/2021  Medication Sig   amoxicillin (AMOXIL) 500 MG capsule Take 500 mg by mouth 2 (two) times daily.   acetaminophen (TYLENOL) 325 MG tablet Take 650 mg by mouth as needed for mild pain.   Amantadine HCl 100 MG tablet Take 100 mg by mouth 2 (two) times daily.   amLODipine (NORVASC) 5 MG tablet Take 5 mg by mouth daily.   apixaban (ELIQUIS) 5 MG TABS tablet Take 1 tablet (5 mg total) by mouth 2 (two) times daily.   Artificial Saliva (BIOTENE MOISTURIZING MOUTH MT) Use as directed 2 sprays in the mouth or throat in the morning, at noon, and at bedtime.  atorvastatin (LIPITOR) 80 MG tablet Take 1 tablet (80 mg total) by mouth daily.   bisacodyl (DULCOLAX) 10 MG suppository Place 1 suppository (10 mg total) rectally daily as needed for moderate constipation or mild constipation.   clobetasol (TEMOVATE) 0.05 % external solution Apply 1 application  topically as needed (itching).   diazePAM, 15 MG Dose, (VALTOCO 15 MG DOSE) 2 x 7.5 MG/0.1ML LQPK Place 7.5 mg into each nostril (total of 15 mg) for grand mal seizure lasting over 5 minutes. Can repeat once in 4 hours. Do Not use more than twice in 24 hours   dofetilide (TIKOSYN) 125 MCG capsule Take 3 capsules (375 mcg total) by mouth 2 (two) times daily.   famotidine (PEPCID) 20 MG tablet Take 20 mg by mouth at bedtime as needed for indigestion.   finasteride (PROSCAR) 5 MG tablet Take 1 tablet (5 mg total) by mouth daily.   Glucerna (GLUCERNA) LIQD Take 237 mLs by mouth daily.   hydrocortisone 2.5 % cream Apply 1 application. topically daily as needed (itching).   lacosamide (VIMPAT) 50 MG TABS tablet Take 1 tablet (50 mg total) by mouth 2 (two) times daily.   levalbuterol (XOPENEX) 0.63 MG/3ML nebulizer  solution Take 0.63 mg by nebulization every 6 (six) hours as needed for wheezing or shortness of breath.   magnesium gluconate (MAGONATE) 500 MG tablet Take 1 tablet (500 mg total) by mouth daily.   melatonin 5 MG TABS Take 5 mg by mouth at bedtime.   methenamine (MANDELAMINE) 1 g tablet Take 1,000 mg by mouth 2 (two) times daily.   methylphenidate (RITALIN) 5 MG tablet Use at 7 AM  and again at 2 PM po   Multiple Vitamin (MULTIVITAMIN WITH MINERALS) TABS tablet Take 1 tablet by mouth daily.   omeprazole (PRILOSEC) 20 MG capsule Take 20 mg by mouth daily.   Polyethyl Glycol-Propyl Glycol (SYSTANE) 0.4-0.3 % SOLN Place 2 drops into both eyes daily.   polyethylene glycol (MIRALAX / GLYCOLAX) 17 g packet Take 17 g by mouth every other day.   potassium chloride (KLOR-CON) 10 MEQ tablet Take 1 tablet (10 mEq total) by mouth daily.   senna-docusate (SENOKOT-S) 8.6-50 MG tablet Take 1 tablet by mouth at bedtime as needed.   torsemide (DEMADEX) 20 MG tablet Take 1 tablet (20 mg total) by mouth daily.   [DISCONTINUED] predniSONE (DELTASONE) 20 MG tablet Take 1 tablet (20 mg total) by mouth 2 (two) times daily with a meal.   No facility-administered encounter medications on file as of 12/16/2021.    Review of Systems  Unable to perform ROS: Mental status change    Immunization History  Administered Date(s) Administered   Fluad Quad(high Dose 65+) 12/06/2018, 12/09/2020   Influenza Whole 12/25/2008, 01/05/2010, 12/30/2011   Influenza, High Dose Seasonal PF 12/14/2016   Influenza,inj,Quad PF,6+ Mos 12/19/2013   Influenza,inj,quad, With Preservative 11/22/2017   Influenza-Unspecified 12/14/2015, 12/24/2019   Moderna Covid-19 Vaccine Bivalent Booster 70yr & up 01/12/2021   Moderna SARS-COV2 Booster Vaccination 01/02/2020, 06/26/2020   Moderna Sars-Covid-2 Vaccination 04/08/2019, 05/05/2019   Pneumococcal Conjugate-13 12/19/2013   Pneumococcal Polysaccharide-23 09/24/2007, 05/22/2017   Td  12/25/2008   Tdap 03/22/2018   Zoster Recombinat (Shingrix) 07/16/2019, 10/28/2019   Zoster, Live 09/24/2007   Pertinent  Health Maintenance Due  Topic Date Due   INFLUENZA VACCINE  10/05/2021   OPHTHALMOLOGY EXAM  10/06/2022 (Originally 10/04/1950)   HEMOGLOBIN A1C  01/05/2022   FOOT EXAM  10/23/2022   COLONOSCOPY (Pts 45-467yrInsurance coverage will  need to be confirmed)  Discontinued      08/10/2021   11:10 PM 08/11/2021    8:00 AM 08/11/2021    7:30 PM 08/12/2021    8:00 AM 10/13/2021    2:32 PM  Fall Risk  Falls in the past year?     0  Patient Fall Risk Level High fall risk High fall risk High fall risk High fall risk    Functional Status Survey:    Vitals:   12/16/21 1129  BP: 102/72  Pulse: 69  Resp: (!) 22  Temp: 98.1 F (36.7 C)  SpO2: 98%  Weight: 134 lb 9.6 oz (61.1 kg)   Body mass index is 21.08 kg/m. Physical Exam Vitals reviewed.  Constitutional:      General: He is not in acute distress.    Appearance: He is not diaphoretic.  HENT:     Head: Normocephalic and atraumatic.     Mouth/Throat:     Comments: Refused to open mouth Eyes:     Conjunctiva/sclera: Conjunctivae normal.     Pupils: Pupils are equal, round, and reactive to light.  Neck:     Thyroid: No thyromegaly.     Vascular: No JVD.     Trachea: No tracheal deviation.  Cardiovascular:     Rate and Rhythm: Normal rate and regular rhythm.     Heart sounds: No murmur heard. Pulmonary:     Effort: Pulmonary effort is normal. No respiratory distress.     Breath sounds: Rales (improved) present. No wheezing.     Comments: Slight increased work of breathing Abdominal:     General: Bowel sounds are normal. There is no distension.     Palpations: Abdomen is soft.     Tenderness: There is no abdominal tenderness.  Musculoskeletal:     Right lower leg: No edema.     Left lower leg: No edema.  Lymphadenopathy:     Cervical: No cervical adenopathy.  Skin:    General: Skin is warm and dry.      Coloration: Skin is pale.  Neurological:     Comments: Left arm flaccid.  Right arm with increased rigidity  No obvious focal deficit to face. No posturing. Asleep, not able to f/c.      Labs reviewed: Recent Labs    12/18/20 1630 12/19/20 0610 12/19/20 1632 12/20/20 0153 08/10/21 9629 08/10/21 0957 08/11/21 0408 08/12/21 0107 08/16/21 0000 11/29/21 0000 12/06/21 0000 12/13/21 1157 12/14/21 0000  NA  --  139  --    < > 141   < > 140 142   < > 140 145  --  148*  K  --  3.5  --    < > 3.8   < > 3.6 3.9   < > 4.3 4.1  --  4.8  CL  --  108  --    < > 111  --  109 113*   < > 106 110*  --  108  CO2  --  23  --    < > 16*  --  24 23   < > 22 21  --  23*  GLUCOSE  --  186*  --    < > 148*  --  120* 143*  --   --   --   --   --   BUN  --  16  --    < > 22  --  17 17   < > 31* 33*  --  45*  CREATININE  --  0.76  --    < > 1.18  --  0.81 0.83   < > 0.9 0.9  --  1.0  CALCIUM  --  7.7*  --    < > 8.8*  --  8.6* 8.7*   < > 9.3 9.3  --  10.1  MG 1.9 2.0 1.9   < >  --   --  1.9 2.0  --   --   --  2.4  --   PHOS 3.1 2.7 2.4*  --   --   --   --   --   --   --   --   --   --    < > = values in this interval not displayed.   Recent Labs    07/12/21 1056 08/10/21 0937 08/11/21 0408 09/07/21 0000 09/08/21 0000  AST '22 29 27 24 24  '$ ALT '22 26 23 18 18  '$ ALKPHOS  --  93 80 105 105  BILITOT 0.7 0.5 0.8  --   --   PROT 7.2 6.4* 5.8*  --   --   ALBUMIN  --  3.3* 3.1* 3.5 3.5   Recent Labs    07/12/21 1056 08/10/21 0937 08/10/21 0957 08/11/21 0408 08/12/21 0107 09/07/21 0000 11/01/21 0000 11/08/21 0000 12/06/21 0000  WBC 9.0 16.1*  --  9.1 10.2   < > 8.9 7.9 8.1  NEUTROABS 6,804 14.3*  --   --  7.8*  --   --   --   --   HGB 10.9* 10.0*   < > 9.4* 9.4*   < > 9.4* 9.9* 9.2*  HCT 33.9* 31.6*   < > 28.7* 29.8*   < > 28* 30* 28*  MCV 90.4 96.3  --  92.0 94.3  --   --   --   --   PLT 426* 238  --  218 227   < > 258 295 338   < > = values in this interval not displayed.   Lab Results   Component Value Date   TSH 1.773 01/24/2021   Lab Results  Component Value Date   HGBA1C 5.7 07/05/2021   Lab Results  Component Value Date   CHOL 110 07/05/2021   HDL 43 07/05/2021   LDLCALC 57 07/05/2021   TRIG 51 07/05/2021   CHOLHDL 2.8 08/08/2020    Significant Diagnostic Results in last 30 days:  CT Chest Wo Contrast  Result Date: 12/16/2021 CLINICAL DATA:  81 year old male with history of rheumatic fever with suspected aspiration. EXAM: CT CHEST WITHOUT CONTRAST TECHNIQUE: Multidetector CT imaging of the chest was performed following the standard protocol without IV contrast. RADIATION DOSE REDUCTION: This exam was performed according to the departmental dose-optimization program which includes automated exposure control, adjustment of the mA and/or kV according to patient size and/or use of iterative reconstruction technique. COMPARISON:  No priors. FINDINGS: Cardiovascular: Heart size is enlarged with left atrial dilatation. There is no significant pericardial fluid, thickening or pericardial calcification. There is aortic atherosclerosis, as well as atherosclerosis of the great vessels of the mediastinum and the coronary arteries, including calcified atherosclerotic plaque in the left main, left anterior descending, left circumflex and right coronary arteries. Severe calcifications of the mitral annulus. Status post median sternotomy for aortic and mitral valve replacement with what appear to be a stented bioprosthesis. Aneurysmal dilatation of the ascending thoracic aorta (5.1 cm in diameter). Dilatation of the pulmonic trunk (4.8  cm in diameter). Mediastinum/Nodes: Multiple prominent borderline enlarged lymph nodes are noted throughout the mediastinal and hilar nodal stations measuring up to 1.3 cm in short axis in the low right paratracheal nodal station. Esophagus is unremarkable in appearance. No axillary lymphadenopathy. Lungs/Pleura: Widespread areas of ground-glass attenuation  and mild interlobular septal thickening are noted throughout the lungs bilaterally. No confluent consolidative airspace disease. Small right pleural effusion. No left pleural effusion. No definite suspicious appearing pulmonary nodules or masses are noted on today's examination which is limited by considerable patient respiratory motion. Upper Abdomen: Aortic atherosclerosis. Musculoskeletal: Median sternotomy wires. There are no aggressive appearing lytic or blastic lesions noted in the visualized portions of the skeleton. IMPRESSION: 1. The appearance of the chest is most suggestive of congestive heart failure. Specifically, there is cardiomegaly with what appears to be mild interstitial pulmonary edema and small right pleural effusion. 2. 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 3. Cardiomegaly with left atrial dilatation. 4. Aortic atherosclerosis, in addition to left main and three-vessel coronary artery disease. 5. Additional incidental findings, as above. Aortic Atherosclerosis (ICD10-I70.0). Electronically Signed   By: Vinnie Langton M.D.   On: 12/16/2021 06:42    Assessment/Plan 1. Chronic systolic heart failure (HCC) Has lost 10+ lbs but continues to have increased work of breathing. Sats ok on 2 liters of oxygen.  CT of the chest indicated CHF with dilation of the pulmonary trunk concerning for pulmonary htn and ascending aorta dilatation 5.1cm.  Echo and cardiology f/u later this month.  Torsemide on hold due to significant weight loss.  will contact cardiology for further direction.   Consider palliative approach with xanax and/or morphine if no other options are available for his sob.    2. Seizures ([place]) Third seizure in one month.  Discussed with Dr. Lyndel Safe, will each out to PMR regarding holding or reducing Ritalin ?if it is lowering his seizure threshold vs triggered by acute illness  Prior attempts to increase vimpat have  led to lethargy, wife reports he is no longer seeing Dr [name]. POA Ms. Tuggle has does not want him to go to the hospital but be treated within the facility. Valium given with resolution of seizure.    3. Hypernatremia Encourage water intake when alert.  Monitor BMP   ADDENDUM 12/17/21: Discussed with Dr [name] and he recommended continuing the torsemide due to continued increased work of breathing and findings of pulmonary edema on CT scan. Encourage water and goal NA <150  Discussed with Dr Naaman Plummer stopping Ritalin due to possible s/e, he agrees.   Family/ staff Communication: discussed with wife Herbert Pun. She does not want to pursue aggressive measures or hospitalizations. Would like cardiology feedback and treatment for reversible issues. DNR in place.   Labs/tests ordered:  BMP weekly

## 2021-12-17 ENCOUNTER — Telehealth (HOSPITAL_COMMUNITY): Payer: Self-pay | Admitting: *Deleted

## 2021-12-17 ENCOUNTER — Telehealth: Payer: Self-pay | Admitting: *Deleted

## 2021-12-17 DIAGNOSIS — R293 Abnormal posture: Secondary | ICD-10-CM | POA: Diagnosis not present

## 2021-12-17 DIAGNOSIS — I69391 Dysphagia following cerebral infarction: Secondary | ICD-10-CM | POA: Diagnosis not present

## 2021-12-17 DIAGNOSIS — I5022 Chronic systolic (congestive) heart failure: Secondary | ICD-10-CM | POA: Diagnosis not present

## 2021-12-17 DIAGNOSIS — R41841 Cognitive communication deficit: Secondary | ICD-10-CM | POA: Diagnosis not present

## 2021-12-17 DIAGNOSIS — H538 Other visual disturbances: Secondary | ICD-10-CM | POA: Diagnosis not present

## 2021-12-17 DIAGNOSIS — I69812 Visuospatial deficit and spatial neglect following other cerebrovascular disease: Secondary | ICD-10-CM | POA: Diagnosis not present

## 2021-12-17 DIAGNOSIS — S065X3S Traumatic subdural hemorrhage with loss of consciousness of 1 hour to 5 hours 59 minutes, sequela: Secondary | ICD-10-CM | POA: Diagnosis not present

## 2021-12-17 DIAGNOSIS — M6389 Disorders of muscle in diseases classified elsewhere, multiple sites: Secondary | ICD-10-CM | POA: Diagnosis not present

## 2021-12-17 DIAGNOSIS — R278 Other lack of coordination: Secondary | ICD-10-CM | POA: Diagnosis not present

## 2021-12-17 DIAGNOSIS — I69354 Hemiplegia and hemiparesis following cerebral infarction affecting left non-dominant side: Secondary | ICD-10-CM | POA: Diagnosis not present

## 2021-12-17 DIAGNOSIS — R41840 Attention and concentration deficit: Secondary | ICD-10-CM | POA: Diagnosis not present

## 2021-12-17 DIAGNOSIS — G4089 Other seizures: Secondary | ICD-10-CM | POA: Diagnosis not present

## 2021-12-17 NOTE — Telephone Encounter (Signed)
Rollene Fare who is caring for [name] [name] at [facility] called to ask if Dr Naaman Plummer wants them to hold the Ritalin on [place]. He is having some issues with breathing and his heart failure and seizure activity.  Please advise.

## 2021-12-17 NOTE — Addendum Note (Signed)
Addended by: Barnie Mort on: 12/17/2021 11:15 AM   Modules accepted: Orders

## 2021-12-17 NOTE — Telephone Encounter (Signed)
Left detailed vm for  [facility].

## 2021-12-17 NOTE — Telephone Encounter (Signed)
[name] [name] with [facility] called to report He has lost 10+ lbs in the past 4 days. NA was 148 on 10/11.  Repeat NA 145 10/12. Staff has been encouraging water. The staff do not think his work of breathing improved. Torsemide and K were held yesterday. They need advice on decreasing torsemide or not.   Call back [place] [phone]  Routed to [place]

## 2021-12-20 ENCOUNTER — Encounter: Payer: Self-pay | Admitting: Internal Medicine

## 2021-12-20 ENCOUNTER — Non-Acute Institutional Stay (SKILLED_NURSING_FACILITY): Payer: Medicare Other | Admitting: Internal Medicine

## 2021-12-20 ENCOUNTER — Telehealth: Payer: Self-pay | Admitting: Neurology

## 2021-12-20 DIAGNOSIS — I5022 Chronic systolic (congestive) heart failure: Secondary | ICD-10-CM

## 2021-12-20 DIAGNOSIS — I69391 Dysphagia following cerebral infarction: Secondary | ICD-10-CM | POA: Diagnosis not present

## 2021-12-20 DIAGNOSIS — I69812 Visuospatial deficit and spatial neglect following other cerebrovascular disease: Secondary | ICD-10-CM | POA: Diagnosis not present

## 2021-12-20 DIAGNOSIS — E870 Hyperosmolality and hypernatremia: Secondary | ICD-10-CM

## 2021-12-20 DIAGNOSIS — R41841 Cognitive communication deficit: Secondary | ICD-10-CM | POA: Diagnosis not present

## 2021-12-20 DIAGNOSIS — R41840 Attention and concentration deficit: Secondary | ICD-10-CM | POA: Diagnosis not present

## 2021-12-20 DIAGNOSIS — R5383 Other fatigue: Secondary | ICD-10-CM

## 2021-12-20 DIAGNOSIS — G4089 Other seizures: Secondary | ICD-10-CM | POA: Diagnosis not present

## 2021-12-20 DIAGNOSIS — R278 Other lack of coordination: Secondary | ICD-10-CM | POA: Diagnosis not present

## 2021-12-20 DIAGNOSIS — H538 Other visual disturbances: Secondary | ICD-10-CM | POA: Diagnosis not present

## 2021-12-20 DIAGNOSIS — S065X3S Traumatic subdural hemorrhage with loss of consciousness of 1 hour to 5 hours 59 minutes, sequela: Secondary | ICD-10-CM | POA: Diagnosis not present

## 2021-12-20 DIAGNOSIS — R569 Unspecified convulsions: Secondary | ICD-10-CM

## 2021-12-20 DIAGNOSIS — M6389 Disorders of muscle in diseases classified elsewhere, multiple sites: Secondary | ICD-10-CM | POA: Diagnosis not present

## 2021-12-20 DIAGNOSIS — I69354 Hemiplegia and hemiparesis following cerebral infarction affecting left non-dominant side: Secondary | ICD-10-CM | POA: Diagnosis not present

## 2021-12-20 DIAGNOSIS — J9600 Acute respiratory failure, unspecified whether with hypoxia or hypercapnia: Secondary | ICD-10-CM | POA: Diagnosis not present

## 2021-12-20 DIAGNOSIS — R293 Abnormal posture: Secondary | ICD-10-CM | POA: Diagnosis not present

## 2021-12-20 LAB — BASIC METABOLIC PANEL
BUN: 28 — AB (ref 4–21)
CO2: 26 — AB (ref 13–22)
Chloride: 103 (ref 99–108)
Creatinine: 0.9 (ref 0.6–1.3)
Glucose: 100
Potassium: 3.6 mEq/L (ref 3.5–5.1)
Sodium: 142 (ref 137–147)

## 2021-12-20 LAB — COMPREHENSIVE METABOLIC PANEL
Calcium: 8.9 (ref 8.7–10.7)
eGFR: 88

## 2021-12-20 NOTE — Telephone Encounter (Signed)
Called and spoke w/ wife. Relayed Dr. Brett Fairy recommends increasing Vimpat from '50mg'$  po BID to '100mg'$  po BID. Wife concerned about making him too drowsy on increased dose.  Dr. Lyndel Safe was actually with pt evaluating him. Requested to speak with Dr. Brett Fairy about pt. I transferred call to Dr. Brett Fairy who picked up call to discuss pt.

## 2021-12-20 NOTE — Telephone Encounter (Signed)
Pt's wife is asking for a call from RN to discuss events pt is having while on BiPap

## 2021-12-20 NOTE — Progress Notes (Unsigned)
Location: Occupational psychologist of Service:  SNF (31)  Provider:   Code Status: DNR Goals of Care:     09/13/2021   10:08 AM  Advanced Directives  Does Patient Have a Medical Advance Directive? Yes  Type of Paramedic of [place];Living will;Out of facility DNR (pink MOST or yellow form)  Does patient want to make changes to medical advance directive? No - Patient declined  Copy of [place] in Chart? Yes - validated most recent copy scanned in chart (See row information)     Chief Complaint  Patient presents with   Acute Visit    HPI: Patient is a 81 y.o. 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  But since then patient has become less verbal and has been sleeping more. Today patient was sleeping but not respond to me.  And per wife this is how its been for last 2 days. He has been eating 50 to 75% of his meals.  Has not had any fever.   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    Other and unspecified hyperlipidemia    Pre-diabetes    Previous back surgery 1978, jan 2007   Psychosexual dysfunction with inhibited sexual excitement    SOB (shortness of breath)    "with heavy exercise"   Stroke ([place]) 08/2013   "I WAS IN AFIB AND THREW A CLOT, THE EFFECTS WERE TRANSITORY AND DIDNT LAST BUT FOR 30 MINUTES"    Thoracic aortic aneurysm [facility])     Past Surgical History:  Procedure Laterality Date   AORTIC AND MITRAL VALVE REPLACEMENT     09/2004   CARDIOVERSION     3 times from 2004-2006   CARDIOVERSION N/A 09/26/2013   Procedure: CARDIOVERSION;  Surgeon: Larey Dresser, MD;  Location: [place];  Service: Cardiovascular;  Laterality: N/A;   CARDIOVERSION N/A 06/19/2014   Procedure: CARDIOVERSION;  Surgeon: Jerline Pain, MD;  Location: [place];  Service: Cardiovascular;  Laterality: N/A;   CARDIOVERSION N/A 04/24/2017   Procedure: CARDIOVERSION;  Surgeon: Larey Dresser, MD;  Location: [place];  Service: Cardiovascular;  Laterality: N/A;   CARDIOVERSION N/A 06/08/2017   Procedure: CARDIOVERSION;  Surgeon: Pixie Casino, MD;  Location: [facility] ENDOSCOPY;  Service: Cardiovascular;  Laterality: N/A;   CARDIOVERSION N/A 08/24/2017   Procedure: CARDIOVERSION;  Surgeon: Skeet Latch, MD;  Location: [facility] ENDOSCOPY;  Service: Cardiovascular;  Laterality: N/A;   CARDIOVERSION N/A 02/14/2018   Procedure: CARDIOVERSION;  Surgeon: Larey Dresser, MD;  Location: [facility] ENDOSCOPY;  Service: Cardiovascular;  Laterality: N/A;   COLONOSCOPY     CRANIOTOMY N/A 07/28/2020   Procedure: CRANIOTOMY FOR EVACUATION OF SUBDURAL HEMATOMA;  Surgeon: Eustace Montalban, MD;  Location: [place];  Service: Neurosurgery;  Laterality: N/A;   IR ANGIO VERTEBRAL SEL SUBCLAVIAN INNOMINATE UNI R MOD SED  08/07/2020   IR CT HEAD LTD  08/07/2020   IR PERCUTANEOUS ART  THROMBECTOMY/INFUSION INTRACRANIAL INC DIAG ANGIO  08/07/2020   laminectomies     10/1975 and in 03/2005   RADIOLOGY WITH ANESTHESIA N/A 08/07/2020   Procedure: IR WITH ANESTHESIA;  Surgeon: Luanne Bras, MD;  Location: [place];  Service: Radiology;  Laterality: N/A;   TEE WITHOUT CARDIOVERSION N/A 12/18/2020   Procedure: TRANSESOPHAGEAL ECHOCARDIOGRAM (TEE);  Surgeon: Larey Dresser, MD;  Location: [facility] ENDOSCOPY;  Service: Cardiovascular;  Laterality: N/A;   [place] Right 04/03/2018   Procedure: TOTAL HIP ARTHROPLASTY ANTERIOR APPROACH;  Surgeon: Paralee Cancel, MD;  Location: [facility];  Service: Orthopedics;  Laterality: Right;  70 mins    Allergies  Allergen Reactions   Naproxen Hives   No [facility] Food Allergies Other (See Comments)    Scallops - distress, nausea and vomitting    Outpatient Encounter Medications as of 12/20/2021  Medication Sig   acetaminophen (TYLENOL) 325 MG tablet Take 650 mg by mouth as needed for mild pain.   Amantadine HCl 100 MG tablet Take 100 mg by mouth 2 (two) times daily.   amLODipine (NORVASC) 5 MG tablet Take 5 mg by mouth daily.   amoxicillin (AMOXIL) 500 MG capsule Take 500 mg by mouth 2 (two) times daily.   apixaban (ELIQUIS) 5 MG TABS tablet Take 1 tablet (5 mg total) by mouth 2 (two) times daily.   Artificial Saliva (BIOTENE MOISTURIZING MOUTH MT) Use as directed 2 sprays in the mouth or throat in the morning, at noon, and at bedtime.   atorvastatin (LIPITOR) 80 MG tablet Take 1 tablet (80 mg total) by mouth daily.   bisacodyl (DULCOLAX) 10 MG suppository Place 1 suppository (10 mg total) rectally daily as needed for moderate constipation or mild constipation.   clobetasol (TEMOVATE) 0.05 % external solution Apply 1 application  topically as needed (itching).   diazePAM, 15 MG Dose, (VALTOCO 15 MG DOSE) 2 x 7.5 MG/0.1ML LQPK Place 7.5 mg into each nostril (total of 15 mg) for grand mal seizure lasting over  5 minutes. Can repeat once in 4 hours. Do Not use more than twice in 24 hours   dofetilide (TIKOSYN) 125 MCG capsule Take 3 capsules (375 mcg total) by mouth 2 (two) times daily.   famotidine (PEPCID) 20 MG tablet Take 20 mg by mouth at bedtime as needed for indigestion.   finasteride (PROSCAR) 5 MG tablet Take 1 tablet (5 mg total) by mouth daily.   Glucerna (GLUCERNA) LIQD Take 237 mLs by mouth daily.   hydrocortisone 2.5 % cream Apply 1 application. topically daily as needed (itching).   lacosamide (VIMPAT) 50 MG TABS tablet Take 1 tablet (50 mg total) by mouth 2 (two) times daily.   levalbuterol (XOPENEX) 0.63 MG/3ML nebulizer solution Take 0.63 mg by nebulization every 6 (six) hours as needed  for wheezing or shortness of breath.   magnesium gluconate (MAGONATE) 500 MG tablet Take 1 tablet (500 mg total) by mouth daily.   melatonin 5 MG TABS Take 5 mg by mouth at bedtime.   methenamine (MANDELAMINE) 1 g tablet Take 1,000 mg by mouth 2 (two) times daily.   Multiple Vitamin (MULTIVITAMIN WITH MINERALS) TABS tablet Take 1 tablet by mouth daily.   omeprazole (PRILOSEC) 20 MG capsule Take 20 mg by mouth daily.   Polyethyl Glycol-Propyl Glycol (SYSTANE) 0.4-0.3 % SOLN Place 2 drops into both eyes daily.   polyethylene glycol (MIRALAX / GLYCOLAX) 17 g packet Take 17 g by mouth every other day.   potassium chloride (KLOR-CON) 10 MEQ tablet Take 1 tablet (10 mEq total) by mouth daily.   senna-docusate (SENOKOT-S) 8.6-50 MG tablet Take 1 tablet by mouth at bedtime as needed.   torsemide (DEMADEX) 20 MG tablet Take 1 tablet (20 mg total) by mouth daily.   No facility-administered encounter medications on file as of 12/20/2021.    Review of Systems:  Review of Systems  Unable to perform ROS: Dementia    Health Maintenance  Topic Date Due   Diabetic kidney evaluation - Urine ACR  Never done   COVID-19 Vaccine (4 - Moderna risk series) 03/09/2021   INFLUENZA VACCINE  10/05/2021   OPHTHALMOLOGY  EXAM  10/06/2022 (Originally 10/04/1950)   HEMOGLOBIN A1C  01/05/2022   FOOT EXAM  10/23/2022   Diabetic kidney evaluation - GFR measurement  12/15/2022   TETANUS/TDAP  03/22/2028   Pneumonia Vaccine 67+ Years old  Completed   Zoster Vaccines- Shingrix  Completed   HPV VACCINES  Aged Out   COLONOSCOPY (Pts 45-9yr Insurance coverage will need to be confirmed)  Discontinued   Hepatitis C Screening  Discontinued    Physical Exam: Vitals:   12/20/21 2248  BP: 111/69  Pulse: 70  Resp: 16  Temp: 98 F (36.7 C)  SpO2: 96%  Weight: 138 lb 9.6 oz (62.9 kg)   Body mass index is 21.71 kg/m. Physical Exam Vitals reviewed.  Constitutional:      Comments: Sleeping  HENT:     Head: Normocephalic.     Nose: Nose normal.     Mouth/Throat:     Mouth: Mucous membranes are moist.     Pharynx: Oropharynx is clear.  Eyes:     Pupils: Pupils are equal, round, and reactive to light.  Cardiovascular:     Rate and Rhythm: Normal rate and regular rhythm.     Pulses: Normal pulses.     Heart sounds: No murmur heard. Pulmonary:     Effort: Pulmonary effort is normal. No respiratory distress.     Breath sounds: Normal breath sounds.     Comments: Few rales in right base Abdominal:     General: Abdomen is flat. Bowel sounds are normal.     Palpations: Abdomen is soft.  Musculoskeletal:        General: No swelling.     Cervical back: Neck supple.  Skin:    General: Skin is warm.  Neurological:     Comments: Was more sleepy and not responding  Psychiatric:        Mood and Affect: Mood normal.        Thought Content: Thought content normal.     Labs reviewed: Basic Metabolic Panel: Recent Labs    01/24/21 0157 01/25/21 0409806/06/23 0119106/06/23 0957 08/11/21 0408 08/12/21 0107 08/16/21 0000 11/29/21 0000 12/06/21 0000 12/13/21 1157  12/14/21 0000  NA  --    < > 141   < > 140 142   < > 140 145  --  148*  K  --    < > 3.8   < > 3.6 3.9   < > 4.3 4.1  --  4.8  CL  --    < >  111  --  109 113*   < > 106 110*  --  108  CO2  --    < > 16*  --  24 23   < > 22 21  --  23*  GLUCOSE  --    < > 148*  --  120* 143*  --   --   --   --   --   BUN  --    < > 22  --  17 17   < > 31* 33*  --  45*  CREATININE  --    < > 1.18  --  0.81 0.83   < > 0.9 0.9  --  1.0  CALCIUM  --    < > 8.8*  --  8.6* 8.7*   < > 9.3 9.3  --  10.1  MG 1.9   < >  --   --  1.9 2.0  --   --   --  2.4  --   TSH 1.773  --   --   --   --   --   --   --   --   --   --    < > = values in this interval not displayed.   Liver Function Tests: Recent Labs    07/12/21 1056 08/10/21 0937 08/11/21 0408 09/07/21 0000 09/08/21 0000  AST '22 29 27 24 24  '$ ALT '22 26 23 18 18  '$ ALKPHOS  --  93 80 105 105  BILITOT 0.7 0.5 0.8  --   --   PROT 7.2 6.4* 5.8*  --   --   ALBUMIN  --  3.3* 3.1* 3.5 3.5   No results for input(s): "LIPASE", "AMYLASE" in the last 8760 hours. Recent Labs    01/24/21 0157  AMMONIA 22   CBC: Recent Labs    07/12/21 1056 08/10/21 0937 08/10/21 0957 08/11/21 0408 08/12/21 0107 09/07/21 0000 11/01/21 0000 11/08/21 0000 12/06/21 0000  WBC 9.0 16.1*  --  9.1 10.2   < > 8.9 7.9 8.1  NEUTROABS 6,804 14.3*  --   --  7.8*  --   --   --   --   HGB 10.9* 10.0*   < > 9.4* 9.4*   < > 9.4* 9.9* 9.2*  HCT 33.9* 31.6*   < > 28.7* 29.8*   < > 28* 30* 28*  MCV 90.4 96.3  --  92.0 94.3  --   --   --   --   PLT 426* 238  --  218 227   < > 258 295 338   < > = values in this interval not displayed.   Lipid Panel: Recent Labs    07/05/21 0000  CHOL 110  HDL 43  LDLCALC 57  TRIG 51   Lab Results  Component Value Date   HGBA1C 5.7 07/05/2021    Procedures since last visit: CT Chest Wo Contrast  Result Date: 12/16/2021 CLINICAL DATA:  81 year old male with history of rheumatic fever with suspected aspiration. EXAM: CT CHEST WITHOUT CONTRAST TECHNIQUE: Multidetector CT imaging of  the chest was performed following the standard protocol without IV contrast. RADIATION DOSE REDUCTION:  This exam was performed according to the departmental dose-optimization program which includes automated exposure control, adjustment of the mA and/or kV according to patient size and/or use of iterative reconstruction technique. COMPARISON:  No priors. FINDINGS: Cardiovascular: Heart size is enlarged with left atrial dilatation. There is no significant pericardial fluid, thickening or pericardial calcification. There is aortic atherosclerosis, as well as atherosclerosis of the great vessels of the mediastinum and the coronary arteries, including calcified atherosclerotic plaque in the left main, left anterior descending, left circumflex and right coronary arteries. Severe calcifications of the mitral annulus. Status post median sternotomy for aortic and mitral valve replacement with what appear to be a stented bioprosthesis. Aneurysmal dilatation of the ascending thoracic aorta (5.1 cm in diameter). Dilatation of the pulmonic trunk (4.8 cm in diameter). Mediastinum/Nodes: Multiple prominent borderline enlarged lymph nodes are noted throughout the mediastinal and hilar nodal stations measuring up to 1.3 cm in short axis in the low right paratracheal nodal station. Esophagus is unremarkable in appearance. No axillary lymphadenopathy. Lungs/Pleura: Widespread areas of ground-glass attenuation and mild interlobular septal thickening are noted throughout the lungs bilaterally. No confluent consolidative airspace disease. Small right pleural effusion. No left pleural effusion. No definite suspicious appearing pulmonary nodules or masses are noted on today's examination which is limited by considerable patient respiratory motion. Upper Abdomen: Aortic atherosclerosis. Musculoskeletal: Median sternotomy wires. There are no aggressive appearing lytic or blastic lesions noted in the visualized portions of the skeleton. IMPRESSION: 1. The appearance of the chest is most suggestive of congestive heart failure. Specifically,  there is cardiomegaly with what appears to be mild interstitial pulmonary edema and small right pleural effusion. 2. 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 3. Cardiomegaly with left atrial dilatation. 4. Aortic atherosclerosis, in addition to left main and three-vessel coronary artery disease. 5. Additional incidental findings, as above. Aortic Atherosclerosis (ICD10-I70.0). Electronically Signed   By: Vinnie Langton M.D.   On: 12/16/2021 06:42    Assessment/Plan 1. Lethargy BMP came back and Sodium is normal at 142 No Signs of infection His lethargy can be related ot him being off Ritalin Will Consider starting it tommorrow  2. Hypernatremia Sodium is good today Levles have been onhigher side since he has been on Diuretics  3. Chronic systolic heart failure (HCC) On Toresimide Has gained some weight recnelty but looks on drier side Will continue same dose no Change 2 D echo pending 4. Seizures ([place]) D/W [place] He gets lethargic with igher dose of Vimapat will incrase it to 100 mg at night and 50 ng during daytine Will make this change after starting him back on Ritalin 5 Anemia Low but stable Were holding GI work up due to his other medical issues   6 Traumatic Brain  Amantadine    6. 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 Slow transit constipation Miralax prn   8. BPH without urinary obstruction Proscar Gemtesa  and Hiprex   9. Obstructive sleep apnea syndrome On BIPAP   10. PAF (paroxysmal atrial fibrillation) (HCC) On Eliquis and Tikosyn   11. Prosthetic valve endocarditis On Amoxicillin Indefinite   12. Essential hypertension Norvasc 13 HLD    Labs/tests ordered:  * No order type specified * Next appt:  Visit date not found

## 2021-12-20 NOTE — Telephone Encounter (Signed)
Called wife back. She reports he is averaging 7-8 hr per night of BIPAP use. Machine records good mask seal. However, I relayed that after reviewing report, it is showing a lot of leaks still. She confirmed he received chinstrap. Not sure if facility is utilizing. She will confirm this with facility and have him continuously use for the next week. She will call back next week for [place] to look at data to see if any improvements show.  She also wanted to report that he had a seizure 12/05/21 that lasted about 5 min. He was sitting watching TV. No injuries. He had another seizure 12/16/21 that was longer than 5 min (about 6 min). They gave him rescue nasal spray. He was watching breakfast with speech therapist at time of seizure. No injuries.  Confirmed he is taking Vimpat '50mg'$  po BID.  He was on ritalin but this was d/c'd by Dr. Naaman Plummer.

## 2021-12-21 ENCOUNTER — Encounter: Payer: Self-pay | Admitting: Internal Medicine

## 2021-12-21 DIAGNOSIS — I69391 Dysphagia following cerebral infarction: Secondary | ICD-10-CM | POA: Diagnosis not present

## 2021-12-21 DIAGNOSIS — M6389 Disorders of muscle in diseases classified elsewhere, multiple sites: Secondary | ICD-10-CM | POA: Diagnosis not present

## 2021-12-21 DIAGNOSIS — R278 Other lack of coordination: Secondary | ICD-10-CM | POA: Diagnosis not present

## 2021-12-21 DIAGNOSIS — S065X3S Traumatic subdural hemorrhage with loss of consciousness of 1 hour to 5 hours 59 minutes, sequela: Secondary | ICD-10-CM | POA: Diagnosis not present

## 2021-12-21 DIAGNOSIS — H538 Other visual disturbances: Secondary | ICD-10-CM | POA: Diagnosis not present

## 2021-12-21 DIAGNOSIS — G4089 Other seizures: Secondary | ICD-10-CM | POA: Diagnosis not present

## 2021-12-21 DIAGNOSIS — I69354 Hemiplegia and hemiparesis following cerebral infarction affecting left non-dominant side: Secondary | ICD-10-CM | POA: Diagnosis not present

## 2021-12-21 DIAGNOSIS — R41840 Attention and concentration deficit: Secondary | ICD-10-CM | POA: Diagnosis not present

## 2021-12-21 DIAGNOSIS — R41841 Cognitive communication deficit: Secondary | ICD-10-CM | POA: Diagnosis not present

## 2021-12-21 DIAGNOSIS — R293 Abnormal posture: Secondary | ICD-10-CM | POA: Diagnosis not present

## 2021-12-21 DIAGNOSIS — I69812 Visuospatial deficit and spatial neglect following other cerebrovascular disease: Secondary | ICD-10-CM | POA: Diagnosis not present

## 2021-12-21 DIAGNOSIS — I5022 Chronic systolic (congestive) heart failure: Secondary | ICD-10-CM | POA: Diagnosis not present

## 2021-12-21 NOTE — Telephone Encounter (Signed)
I spoke yesterday at 1700 hrs. on 12-20-2021 with Dr. Lyndel Safe.  Dr. Lyda Kalata reminded me that the patient was very drowsy and unarousable when he was previously tried on a higher dose of Vimpat of 100 mg twice daily for this reason it had been decreased to 50 mg twice daily.  We agree that she should be treated again with Ritalin as a stimulant may break his somnolent state and that we will be more careful and use Vimpat 50 mg in the morning and Vimpat at 100 mg at nighttime dose.  We both agreed to start this treatment and the patient may also have to have a rescue nasal spray refill.Asencion Partridge Allysia Ingles MD

## 2021-12-22 DIAGNOSIS — R278 Other lack of coordination: Secondary | ICD-10-CM | POA: Diagnosis not present

## 2021-12-22 DIAGNOSIS — R41840 Attention and concentration deficit: Secondary | ICD-10-CM | POA: Diagnosis not present

## 2021-12-22 DIAGNOSIS — I69812 Visuospatial deficit and spatial neglect following other cerebrovascular disease: Secondary | ICD-10-CM | POA: Diagnosis not present

## 2021-12-22 DIAGNOSIS — I69354 Hemiplegia and hemiparesis following cerebral infarction affecting left non-dominant side: Secondary | ICD-10-CM | POA: Diagnosis not present

## 2021-12-22 DIAGNOSIS — M6389 Disorders of muscle in diseases classified elsewhere, multiple sites: Secondary | ICD-10-CM | POA: Diagnosis not present

## 2021-12-22 DIAGNOSIS — R293 Abnormal posture: Secondary | ICD-10-CM | POA: Diagnosis not present

## 2021-12-22 DIAGNOSIS — I69391 Dysphagia following cerebral infarction: Secondary | ICD-10-CM | POA: Diagnosis not present

## 2021-12-22 DIAGNOSIS — R41841 Cognitive communication deficit: Secondary | ICD-10-CM | POA: Diagnosis not present

## 2021-12-22 DIAGNOSIS — S065X3S Traumatic subdural hemorrhage with loss of consciousness of 1 hour to 5 hours 59 minutes, sequela: Secondary | ICD-10-CM | POA: Diagnosis not present

## 2021-12-22 DIAGNOSIS — G4089 Other seizures: Secondary | ICD-10-CM | POA: Diagnosis not present

## 2021-12-22 DIAGNOSIS — H538 Other visual disturbances: Secondary | ICD-10-CM | POA: Diagnosis not present

## 2021-12-22 DIAGNOSIS — I5022 Chronic systolic (congestive) heart failure: Secondary | ICD-10-CM | POA: Diagnosis not present

## 2021-12-23 ENCOUNTER — Other Ambulatory Visit: Payer: Self-pay | Admitting: Adult Health

## 2021-12-23 DIAGNOSIS — R41841 Cognitive communication deficit: Secondary | ICD-10-CM | POA: Diagnosis not present

## 2021-12-23 DIAGNOSIS — R41840 Attention and concentration deficit: Secondary | ICD-10-CM | POA: Diagnosis not present

## 2021-12-23 DIAGNOSIS — I69391 Dysphagia following cerebral infarction: Secondary | ICD-10-CM | POA: Diagnosis not present

## 2021-12-23 DIAGNOSIS — I69354 Hemiplegia and hemiparesis following cerebral infarction affecting left non-dominant side: Secondary | ICD-10-CM | POA: Diagnosis not present

## 2021-12-23 DIAGNOSIS — R293 Abnormal posture: Secondary | ICD-10-CM | POA: Diagnosis not present

## 2021-12-23 DIAGNOSIS — R278 Other lack of coordination: Secondary | ICD-10-CM | POA: Diagnosis not present

## 2021-12-23 DIAGNOSIS — M6389 Disorders of muscle in diseases classified elsewhere, multiple sites: Secondary | ICD-10-CM | POA: Diagnosis not present

## 2021-12-23 DIAGNOSIS — S065X3S Traumatic subdural hemorrhage with loss of consciousness of 1 hour to 5 hours 59 minutes, sequela: Secondary | ICD-10-CM | POA: Diagnosis not present

## 2021-12-23 DIAGNOSIS — H538 Other visual disturbances: Secondary | ICD-10-CM | POA: Diagnosis not present

## 2021-12-23 DIAGNOSIS — I5022 Chronic systolic (congestive) heart failure: Secondary | ICD-10-CM | POA: Diagnosis not present

## 2021-12-23 DIAGNOSIS — I69812 Visuospatial deficit and spatial neglect following other cerebrovascular disease: Secondary | ICD-10-CM | POA: Diagnosis not present

## 2021-12-23 DIAGNOSIS — G4089 Other seizures: Secondary | ICD-10-CM | POA: Diagnosis not present

## 2021-12-23 MED ORDER — METHYLPHENIDATE HCL 5 MG PO TABS: 5.0000 mg | ORAL_TABLET | Freq: Two times a day (BID) | ORAL | 0 refills | Status: DC

## 2021-12-24 ENCOUNTER — Encounter: Payer: Self-pay | Admitting: Adult Health

## 2021-12-24 ENCOUNTER — Non-Acute Institutional Stay (SKILLED_NURSING_FACILITY): Payer: Medicare Other | Admitting: Adult Health

## 2021-12-24 DIAGNOSIS — I5022 Chronic systolic (congestive) heart failure: Secondary | ICD-10-CM | POA: Diagnosis not present

## 2021-12-24 DIAGNOSIS — S069X9D Unspecified intracranial injury with loss of consciousness of unspecified duration, subsequent encounter: Secondary | ICD-10-CM

## 2021-12-24 DIAGNOSIS — R569 Unspecified convulsions: Secondary | ICD-10-CM | POA: Diagnosis not present

## 2021-12-24 DIAGNOSIS — R293 Abnormal posture: Secondary | ICD-10-CM | POA: Diagnosis not present

## 2021-12-24 DIAGNOSIS — R278 Other lack of coordination: Secondary | ICD-10-CM | POA: Diagnosis not present

## 2021-12-24 DIAGNOSIS — G4089 Other seizures: Secondary | ICD-10-CM | POA: Diagnosis not present

## 2021-12-24 DIAGNOSIS — H538 Other visual disturbances: Secondary | ICD-10-CM | POA: Diagnosis not present

## 2021-12-24 DIAGNOSIS — I69354 Hemiplegia and hemiparesis following cerebral infarction affecting left non-dominant side: Secondary | ICD-10-CM | POA: Diagnosis not present

## 2021-12-24 DIAGNOSIS — M6389 Disorders of muscle in diseases classified elsewhere, multiple sites: Secondary | ICD-10-CM | POA: Diagnosis not present

## 2021-12-24 DIAGNOSIS — R41840 Attention and concentration deficit: Secondary | ICD-10-CM | POA: Diagnosis not present

## 2021-12-24 DIAGNOSIS — I69391 Dysphagia following cerebral infarction: Secondary | ICD-10-CM | POA: Diagnosis not present

## 2021-12-24 DIAGNOSIS — S065X3S Traumatic subdural hemorrhage with loss of consciousness of 1 hour to 5 hours 59 minutes, sequela: Secondary | ICD-10-CM | POA: Diagnosis not present

## 2021-12-24 DIAGNOSIS — R41841 Cognitive communication deficit: Secondary | ICD-10-CM | POA: Diagnosis not present

## 2021-12-24 DIAGNOSIS — I69812 Visuospatial deficit and spatial neglect following other cerebrovascular disease: Secondary | ICD-10-CM | POA: Diagnosis not present

## 2021-12-24 MED ORDER — LACOSAMIDE 50 MG PO TABS: 50.0000 mg | ORAL_TABLET | Freq: Every morning | ORAL | 3 refills | Status: DC

## 2021-12-24 MED ORDER — LACOSAMIDE 50 MG PO TABS: 100.0000 mg | ORAL_TABLET | Freq: Every day | ORAL | 3 refills | Status: DC

## 2021-12-24 NOTE — Progress Notes (Signed)
Location:  Occupational psychologist of Service:  SNF (31) Provider:   Cindi Carbon, [place] [phone]   [name], MD  Patient Care Team: [name], MD as PCP - General (Internal Medicine) Larey Dresser, MD as PCP - Cardiology (Cardiology) [name], MD as Consulting Physician (Ophthalmology) [name], MD (Internal Medicine)  Extended Emergency Contact Information Primary Emergency Contact: [name] Address: [address]          [place], [place] [address] [place] Phone: [phone] Mobile Phone: [phone] Relation: Spouse Secondary Emergency Contact: [name], [place] [address] [name] Phone: [phone] Relation: Son  Code Status:  DNR Goals of care: Advanced Directive information    09/13/2021   10:08 AM  Advanced Directives  Does Patient Have a Medical Advance Directive? Yes  Type of Paramed[place] of [place];Living will;Out of facility DNR (pink MOST or yellow form)  Does patient want to make changes to medical advance directive? No - Patient declined  Copy of [place] in Chart? Yes - validated most recent copy scanned in chart (See row information)     Chief Complaint  Patient presents with   Acute Visit    seizure    HPI:  Pt is a 81 y.o. male seen today for an acute visit for seizure.   PMH includes syncope with fall in May of 2022, TBI with SDH with craniotomy, acute right MCA s/p thrombectomy, bioprosthetic mitral and aortic valve replacement, DM II, diastiolic CHF, BPH, and afib.  On life long amoxicillin for endocarditis Echo 03/31/21 EF 45-50%  Nurse reports he had a seizure for 20-30 seconds this morning with head turning and eyes blinking. Resolved on its on without meds.  This is the fourth seizure in the 6 weeks.  He is on vimpat dose was previously reduced due to  lethargy in June of 2023.  He was started on Ritalin for lethargy related to his TBI in August of 2023. This was held due to seizure risk and then restarted by Dr Lyndel Safe first dose this am prior to seizure due to worsening lethargy after starting.    Also seen by cardiology Dr [name] 12/13/21 felt to have systolic CHF, torsemide increased to daily.  CT of the chest non contrast 12/14/21 showed congestive heart failure, cardiomegaly, small right effusion, and dilatation of the pulmonary trunk 4.8 cm suggestive pulmonary htn.  BNP 372.1 on 12/13/21 Continues with mild increased work of breathing which is intermittent. No acute distress. Sats this morning were 89% on 1.5 liters. Oxygen turned up to 2.5 liters. Sats now 97%.   Weight is variable. Down almost one lb today but had trended upward after a rapid loss. NA doing ok.  He has a dark yellow toenail on the right great toe. No redness.   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    Other and unspecified hyperlipidemia    Pre-diabetes    Previous back surgery 1978, jan 2007   Psychosexual dysfunction with inhibited sexual excitement    SOB (shortness of breath)    "with heavy exercise"   Stroke ([place]) 08/2013   "I WAS IN AFIB AND THREW A CLOT, THE EFFECTS WERE TRANSITORY AND DIDNT LAST BUT FOR 30 MINUTES"    Thoracic aortic aneurysm [facility])    Past Surgical History:  Procedure Laterality Date   AORTIC AND MITRAL VALVE REPLACEMENT     09/2004   CARDIOVERSION      3 times from 2004-2006   CARDIOVERSION N/A 09/26/2013   Procedure: CARDIOVERSION;  Surgeon: Larey Dresser, MD;  Location: [facility];  Service: Cardiovascular;  Laterality: N/A;   CARDIOVERSION N/A 06/19/2014   Procedure: CARDIOVERSION;  Surgeon: Jerline Pain, MD;  Location: [facility];  Service: Cardiovascular;  Laterality: N/A;   CARDIOVERSION N/A 04/24/2017   Procedure: CARDIOVERSION;  Surgeon: Larey Dresser, MD;  Location: [facility];  Service: Cardiovascular;  Laterality: N/A;   CARDIOVERSION N/A 06/08/2017   Procedure: CARDIOVERSION;  Surgeon: Pixie Casino, MD;  Location: [facility] ENDOSCOPY;  Service: Cardiovascular;  Laterality: N/A;   CARDIOVERSION N/A 08/24/2017   Procedure: CARDIOVERSION;  Surgeon: Skeet Latch, MD;  Location: [facility] ENDOSCOPY;  Service: Cardiovascular;  Laterality: N/A;   CARDIOVERSION N/A 02/14/2018   Procedure: CARDIOVERSION;  Surgeon: Larey Dresser, MD;  Location: [facility] ENDOSCOPY;  Service: Cardiovascular;  Laterality: N/A;   COLONOSCOPY     CRANIOTOMY N/A 07/28/2020   Procedure: CRANIOTOMY FOR EVACUATION OF SUBDURAL HEMATOMA;  Surgeon: Eustace Pavey, MD;  Location: [facility];  Service: Neurosurgery;  Laterality: N/A;   IR ANGIO VERTEBRAL SEL SUBCLAVIAN INNOMINATE UNI R MOD SED  08/07/2020   IR CT HEAD LTD  08/07/2020   IR PERCUTANEOUS ART THROMBECTOMY/INFUSION INTRACRANIAL INC DIAG ANGIO  08/07/2020   laminectomies     10/1975 and in 03/2005   RADIOLOGY WITH ANESTHESIA N/A 08/07/2020   Procedure: IR WITH ANESTHESIA;  Surgeon: Luanne Bras, MD;  Location: [facility];  Service: Radiology;  Laterality: N/A;   TEE WITHOUT CARDIOVERSION N/A 12/18/2020   Procedure: TRANSESOPHAGEAL ECHOCARDIOGRAM (TEE);  Surgeon: Larey Dresser, MD;  Location: [facility] ENDOSCOPY;  Service: Cardiovascular;  Laterality: N/A;   [place] Right 04/03/2018   Procedure: TOTAL HIP ARTHROPLASTY ANTERIOR APPROACH;  Surgeon: Paralee Cancel, MD;  Location: [facility]  ORS;  Service: Orthopedics;  Laterality: Right;  70 mins    Allergies  Allergen Reactions   Naproxen Hives   No [facility] Food Allergies Other (See Comments)    Scallops - distress, nausea and vomitting    Outpatient Encounter Medications as of 12/24/2021  Medication Sig   lacosamide (VIMPAT) 50 MG TABS tablet Take 2 tablets (100 mg total) by mouth at bedtime.   acetaminophen (TYLENOL) 325 MG tablet Take 650 mg by mouth as needed for mild pain.   Amantadine HCl 100 MG tablet Take 100 mg by mouth 2 (two) times daily.   amLODipine (NORVASC) 5 MG tablet Take 5 mg by mouth daily.   amoxicillin (AMOXIL) 500 MG capsule Take 500 mg by mouth 2 (two) times daily.   apixaban (ELIQUIS) 5 MG TABS tablet Take 1 tablet (5 mg total) by mouth 2 (two) times daily.   Artificial Saliva (BIOTENE  MOISTURIZING MOUTH MT) Use as directed 2 sprays in the mouth or throat in the morning, at noon, and at bedtime.   atorvastatin (LIPITOR) 80 MG tablet Take 1 tablet (80 mg total) by mouth daily.   bisacodyl (DULCOLAX) 10 MG suppository Place 1 suppository (10 mg total) rectally daily as needed for moderate constipation or mild constipation.   clobetasol (TEMOVATE) 0.05 % external solution Apply 1 application  topically as needed (itching).   diazePAM, 15 MG Dose, (VALTOCO 15 MG DOSE) 2 x 7.5 MG/0.1ML LQPK Place 7.5 mg into each nostril (total of 15 mg) for grand mal seizure lasting over 5 minutes. Can repeat once in 4 hours. Do Not use more than twice in 24 hours   dofetilide (TIKOSYN) 125 MCG capsule Take 3 capsules (375 mcg total) by mouth 2 (two) times daily.   famotidine (PEPCID) 20 MG tablet Take 20 mg by mouth at bedtime as needed for indigestion.   finasteride (PROSCAR) 5 MG tablet Take 1 tablet (5 mg total) by mouth daily.   Glucerna (GLUCERNA) LIQD Take 237 mLs by mouth daily.   hydrocortisone 2.5 % cream Apply 1 application. topically daily as needed (itching).   lacosamide (VIMPAT) 50 MG TABS tablet Take  1 tablet (50 mg total) by mouth in the morning.   levalbuterol (XOPENEX) 0.63 MG/3ML nebulizer solution Take 0.63 mg by nebulization every 6 (six) hours as needed for wheezing or shortness of breath.   magnesium gluconate (MAGONATE) 500 MG tablet Take 1 tablet (500 mg total) by mouth daily.   melatonin 5 MG TABS Take 5 mg by mouth at bedtime.   methenamine (MANDELAMINE) 1 g tablet Take 1,000 mg by mouth 2 (two) times daily.   methylphenidate (RITALIN) 5 MG tablet Take 1 tablet (5 mg total) by mouth 2 (two) times daily. Take at 7 am and 12 pm   Multiple Vitamin (MULTIVITAMIN WITH MINERALS) TABS tablet Take 1 tablet by mouth daily.   omeprazole (PRILOSEC) 20 MG capsule Take 20 mg by mouth daily.   Polyethyl Glycol-Propyl Glycol (SYSTANE) 0.4-0.3 % SOLN Place 2 drops into both eyes daily.   polyethylene glycol (MIRALAX / GLYCOLAX) 17 g packet Take 17 g by mouth every other day.   potassium chloride (KLOR-CON) 10 MEQ tablet Take 1 tablet (10 mEq total) by mouth daily.   senna-docusate (SENOKOT-S) 8.6-50 MG tablet Take 1 tablet by mouth at bedtime as needed.   torsemide (DEMADEX) 20 MG tablet Take 1 tablet (20 mg total) by mouth daily.   [DISCONTINUED] lacosamide (VIMPAT) 50 MG TABS tablet Take 1 tablet (50 mg total) by mouth 2 (two) times daily.   No facility-administered encounter medications on file as of 12/24/2021.    Review of Systems  Unable to perform ROS: Mental status change    Immunization History  Administered Date(s) Administered   Fluad Quad(high Dose 65+) 12/06/2018, 12/09/2020   Influenza Whole 12/25/2008, 01/05/2010, 12/30/2011   Influenza, High Dose Seasonal PF 12/14/2016   Influenza,inj,Quad PF,6+ Mos 12/19/2013   Influenza,inj,quad, With Preservative 11/22/2017   Influenza-Unspecified 12/14/2015, 12/24/2019, 12/10/2021   Moderna Covid-19 Vaccine Bivalent Booster 71yr & up 01/12/2021   Moderna SARS-COV2 Booster Vaccination 01/02/2020, 06/26/2020   Moderna Sars-Covid-2  Vaccination 04/08/2019, 05/05/2019   Pneumococcal Conjugate-13 12/19/2013   Pneumococcal Polysaccharide-23 09/24/2007, 05/22/2017   Td 12/25/2008   Td (Adult), 2 Lf Tetanus Toxid, Preservative Free 12/25/2008   Tdap 03/22/2018   Zoster Recombinat (Shingrix) 07/16/2019, 10/28/2019   Zoster, Live 09/24/2007   Pertinent  Health  Maintenance Due  Topic Date Due   OPHTHALMOLOGY EXAM  10/06/2022 (Originally 10/04/1950)   HEMOGLOBIN A1C  01/05/2022   FOOT EXAM  10/23/2022   INFLUENZA VACCINE  Completed   COLONOSCOPY (Pts 45-67yr Insurance coverage will need to be confirmed)  Discontinued      08/10/2021   11:10 PM 08/11/2021    8:00 AM 08/11/2021    7:30 PM 08/12/2021    8:00 AM 10/13/2021    2:32 PM  [place] the past year?     0  Patient Fall Risk Level High fall risk High fall risk High fall risk High fall risk    Functional Status Survey:    Vitals:   12/24/21 1131  BP: 109/69  Pulse: 71  Resp: (!) 24  Temp: 97.6 F (36.4 C)  SpO2: 97%  Weight: 141 lb 12.8 oz (64.3 kg)   Body mass index is 22.21 kg/m. Physical Exam Vitals reviewed.  Constitutional:      General: He is not in acute distress.    Appearance: He is not diaphoretic.  HENT:     Head: Normocephalic and atraumatic.  Eyes:     Conjunctiva/sclera: Conjunctivae normal.     Pupils: Pupils are equal, round, and reactive to light.  Neck:     Thyroid: No thyromegaly.     Vascular: No JVD.     Trachea: No tracheal deviation.  Cardiovascular:     Rate and Rhythm: Normal rate and regular rhythm.     Heart sounds: No murmur heard. Pulmonary:     Effort: Pulmonary effort is normal. No respiratory distress.     Breath sounds: Rales (improved) present. No wheezing.     Comments: Slight increased work of breathing which is intermittent.  Abdominal:     General: Bowel sounds are normal. There is no distension.     Palpations: Abdomen is soft.     Tenderness: There is no abdominal tenderness.  Musculoskeletal:      Right lower leg: No edema.     Left lower leg: No edema.  Lymphadenopathy:     Cervical: No cervical adenopathy.  Skin:    General: Skin is warm and dry.     Coloration: Skin is pale.     Comments: Right great toenail with dark discoloration.   Neurological:     Comments: Left arm weakness due to prior CVA. Right arm with increased rigidity.      Labs reviewed: Recent Labs    08/10/21 0937 08/10/21 0957 08/11/21 0408 08/12/21 0107 08/16/21 0000 12/06/21 0000 12/13/21 1157 12/14/21 0000 12/20/21 0000  NA 141   < > 140 142   < > 145  --  148* 142  K 3.8   < > 3.6 3.9   < > 4.1  --  4.8 3.6  CL 111  --  109 113*   < > 110*  --  108 103  CO2 16*  --  24 23   < > 21  --  23* 26*  GLUCOSE 148*  --  120* 143*  --   --   --   --   --   BUN 22  --  17 17   < > 33*  --  45* 28*  CREATININE 1.18  --  0.81 0.83   < > 0.9  --  1.0 0.9  CALCIUM 8.8*  --  8.6* 8.7*   < > 9.3  --  10.1 8.9  MG  --   --  1.9 2.0  --   --  2.4  --   --    < > = values in this interval not displayed.    Recent Labs    07/12/21 1056 08/10/21 0937 08/11/21 0408 09/07/21 0000 09/08/21 0000  AST '22 29 27 24 24  '$ ALT '22 26 23 18 18  '$ ALKPHOS  --  93 80 105 105  BILITOT 0.7 0.5 0.8  --   --   PROT 7.2 6.4* 5.8*  --   --   ALBUMIN  --  3.3* 3.1* 3.5 3.5    Recent Labs    07/12/21 1056 08/10/21 0937 08/10/21 0957 08/11/21 0408 08/12/21 0107 09/07/21 0000 11/01/21 0000 11/08/21 0000 12/06/21 0000  WBC 9.0 16.1*  --  9.1 10.2   < > 8.9 7.9 8.1  NEUTROABS 6,804 14.3*  --   --  7.8*  --   --   --   --   HGB 10.9* 10.0*   < > 9.4* 9.4*   < > 9.4* 9.9* 9.2*  HCT 33.9* 31.6*   < > 28.7* 29.8*   < > 28* 30* 28*  MCV 90.4 96.3  --  92.0 94.3  --   --   --   --   PLT 426* 238  --  218 227   < > 258 295 338   < > = values in this interval not displayed.    Lab Results  Component Value Date   TSH 1.773 01/24/2021   Lab Results  Component Value Date   HGBA1C 5.7 07/05/2021   Lab Results   Component Value Date   CHOL 110 07/05/2021   HDL 43 07/05/2021   LDLCALC 57 07/05/2021   TRIG 51 07/05/2021   CHOLHDL 2.8 08/08/2020    Significant Diagnostic Results in last 30 days:  CT Chest Wo Contrast  Result Date: 12/16/2021 CLINICAL DATA:  81 year old male with history of rheumatic fever with suspected aspiration. EXAM: CT CHEST WITHOUT CONTRAST TECHNIQUE: Multidetector CT imaging of the chest was performed following the standard protocol without IV contrast. RADIATION DOSE REDUCTION: This exam was performed according to the departmental dose-optimization program which includes automated exposure control, adjustment of the mA and/or kV according to patient size and/or use of iterative reconstruction technique. COMPARISON:  No priors. FINDINGS: Cardiovascular: Heart size is enlarged with left atrial dilatation. There is no significant pericardial fluid, thickening or pericardial calcification. There is aortic atherosclerosis, as well as atherosclerosis of the great vessels of the mediastinum and the coronary arteries, including calcified atherosclerotic plaque in the left main, left anterior descending, left circumflex and right coronary arteries. Severe calcifications of the mitral annulus. Status post median sternotomy for aortic and mitral valve replacement with what appear to be a stented bioprosthesis. Aneurysmal dilatation of the ascending thoracic aorta (5.1 cm in diameter). Dilatation of the pulmonic trunk (4.8 cm in diameter). Mediastinum/Nodes: Multiple prominent borderline enlarged lymph nodes are noted throughout the mediastinal and hilar nodal stations measuring up to 1.3 cm in short axis in the low right paratracheal nodal station. Esophagus is unremarkable in appearance. No axillary lymphadenopathy. Lungs/Pleura: Widespread areas of ground-glass attenuation and mild interlobular septal thickening are noted throughout the lungs bilaterally. No confluent consolidative airspace  disease. Small right pleural effusion. No left pleural effusion. No definite suspicious appearing pulmonary nodules or masses are noted on today's examination which is limited by considerable patient respiratory motion. Upper Abdomen: Aortic atherosclerosis. Musculoskeletal: Median sternotomy wires. There are no aggressive  appearing lytic or blastic lesions noted in the visualized portions of the skeleton. IMPRESSION: 1. The appearance of the chest is most suggestive of congestive heart failure. Specifically, there is cardiomegaly with what appears to be mild interstitial pulmonary edema and small right pleural effusion. 2. 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 3. Cardiomegaly with left atrial dilatation. 4. Aortic atherosclerosis, in addition to left main and three-vessel coronary artery disease. 5. Additional incidental findings, as above. Aortic Atherosclerosis (ICD10-I70.0). Electronically Signed   By: Vinnie Langton M.D.   On: 12/16/2021 06:42    Assessment/Plan  Seizure Increase Vimpat to 50 mg qam and 100 mg qpm. Discussed with Dr Lyndel Safe. (Consulted with neuro) Monitor for lethargy  2. TBI Back on ritalin as of this morning due to worsening lethargy after stopping. Hoping to offset with vimpat any seizure risk. Alert this morning, eating and drinking.     Family/ staff Communication: discussed with wife Herbert Pun. If he is having worsening symptoms, sob or other sign of distress could try xanax or morphine for comfort. His wife wants to avoid hospitalization. Discussed with Dr Lyndel Safe.   Labs/tests ordered:  BMP weekly

## 2021-12-27 ENCOUNTER — Non-Acute Institutional Stay (SKILLED_NURSING_FACILITY): Payer: Medicare Other | Admitting: Internal Medicine

## 2021-12-27 ENCOUNTER — Encounter: Payer: Self-pay | Admitting: Internal Medicine

## 2021-12-27 DIAGNOSIS — I69812 Visuospatial deficit and spatial neglect following other cerebrovascular disease: Secondary | ICD-10-CM | POA: Diagnosis not present

## 2021-12-27 DIAGNOSIS — R5383 Other fatigue: Secondary | ICD-10-CM | POA: Diagnosis not present

## 2021-12-27 DIAGNOSIS — E889 Metabolic disorder, unspecified: Secondary | ICD-10-CM | POA: Diagnosis not present

## 2021-12-27 DIAGNOSIS — I5022 Chronic systolic (congestive) heart failure: Secondary | ICD-10-CM

## 2021-12-27 DIAGNOSIS — M6389 Disorders of muscle in diseases classified elsewhere, multiple sites: Secondary | ICD-10-CM | POA: Diagnosis not present

## 2021-12-27 DIAGNOSIS — R569 Unspecified convulsions: Secondary | ICD-10-CM | POA: Diagnosis not present

## 2021-12-27 DIAGNOSIS — S065X3S Traumatic subdural hemorrhage with loss of consciousness of 1 hour to 5 hours 59 minutes, sequela: Secondary | ICD-10-CM | POA: Diagnosis not present

## 2021-12-27 DIAGNOSIS — R41841 Cognitive communication deficit: Secondary | ICD-10-CM | POA: Diagnosis not present

## 2021-12-27 DIAGNOSIS — R278 Other lack of coordination: Secondary | ICD-10-CM | POA: Diagnosis not present

## 2021-12-27 DIAGNOSIS — R41840 Attention and concentration deficit: Secondary | ICD-10-CM | POA: Diagnosis not present

## 2021-12-27 DIAGNOSIS — I69391 Dysphagia following cerebral infarction: Secondary | ICD-10-CM | POA: Diagnosis not present

## 2021-12-27 DIAGNOSIS — R293 Abnormal posture: Secondary | ICD-10-CM | POA: Diagnosis not present

## 2021-12-27 DIAGNOSIS — S069X9D Unspecified intracranial injury with loss of consciousness of unspecified duration, subsequent encounter: Secondary | ICD-10-CM | POA: Diagnosis not present

## 2021-12-27 DIAGNOSIS — E870 Hyperosmolality and hypernatremia: Secondary | ICD-10-CM | POA: Diagnosis not present

## 2021-12-27 DIAGNOSIS — H538 Other visual disturbances: Secondary | ICD-10-CM | POA: Diagnosis not present

## 2021-12-27 DIAGNOSIS — I69354 Hemiplegia and hemiparesis following cerebral infarction affecting left non-dominant side: Secondary | ICD-10-CM | POA: Diagnosis not present

## 2021-12-27 DIAGNOSIS — G4089 Other seizures: Secondary | ICD-10-CM | POA: Diagnosis not present

## 2021-12-27 LAB — BASIC METABOLIC PANEL
BUN: 27 — AB (ref 4–21)
CO2: 27 — AB (ref 13–22)
Chloride: 104 (ref 99–108)
Creatinine: 0.9 (ref 0.6–1.3)
Glucose: 141
Potassium: 3.7 mEq/L (ref 3.5–5.1)
Sodium: 144 (ref 137–147)

## 2021-12-27 LAB — COMPREHENSIVE METABOLIC PANEL
Calcium: 9.7 (ref 8.7–10.7)
eGFR: 84

## 2021-12-27 NOTE — Progress Notes (Unsigned)
Location:  [facility] Room Number: 161-W Place of Service:  SNF [phone]) Provider:  [name], MD   [name], MD  Patient Care Team: [name], MD as PCP - General (Internal Medicine) Larey Dresser, MD as PCP - Cardiology (Cardiology) [name], MD as Consulting Physician (Ophthalmology) [name], MD (Internal Medicine)  Extended Emergency Contact Information Primary Emergency Contact: [name] Address: [address]          [place], [place] [address] [place] Phone: [phone] Mobile Phone: [phone] Relation: Spouse Secondary Emergency Contact: [name], [place] [address] [name] of [place] Phone: [phone] Relation: Son  Code Status:  DNR Goals of care: Advanced Directive information    12/27/2021   10:36 AM  Advanced Directives  Does Patient Have a Medical Advance Directive? Yes  Type of Paramedic of [place];Living will  Does patient want to make changes to medical advance directive? No - Patient declined  Copy of [place] in Chart? Yes - validated most recent copy scanned in chart (See row information)     Chief Complaint  Patient presents with   Acute Visit    HPI:  Pt is a 81 y.o. 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  Now he is on 50 mg of Vimpat in the morning and 100 at night.  His wife is  really concerned today because she thinks he is very sedated and not getting up in the morning and able to work with the speech therapy due to Vimpat He was also restarted on his Ritalin  5 mg twice a day due to continued Lethargy off Ritalin He was also noticed to have some deep cough on the weekend with increased respiratory rate.  But today patient seems back to normal. No fever are chest pain or cough.  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   Lesion of ulnar nerve    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    Other and unspecified hyperlipidemia    Pre-diabetes    Previous back surgery 1978, jan 2007   Psychosexual dysfunction with inhibited sexual excitement    SOB (shortness of breath)    "with  heavy exercise"   Stroke ([place]) 08/2013   "I WAS IN AFIB AND THREW A CLOT, THE EFFECTS WERE TRANSITORY AND DIDNT LAST BUT FOR 30 MINUTES"    Thoracic aortic aneurysm [facility])    Past Surgical History:  Procedure Laterality Date   AORTIC AND MITRAL VALVE REPLACEMENT     09/2004   CARDIOVERSION     3 times from 2004-2006   CARDIOVERSION N/A 09/26/2013   Procedure: CARDIOVERSION;  Surgeon: Larey Dresser, MD;  Location: [facility];  Service: Cardiovascular;  Laterality: N/A;   CARDIOVERSION N/A 06/19/2014   Procedure: CARDIOVERSION;  Surgeon: Jerline Pain, MD;  Location: [facility];  Service: Cardiovascular;  Laterality:  N/A;   CARDIOVERSION N/A 04/24/2017   Procedure: CARDIOVERSION;  Surgeon: Larey Dresser, MD;  Location: [facility];  Service: Cardiovascular;  Laterality: N/A;   CARDIOVERSION N/A 06/08/2017   Procedure: CARDIOVERSION;  Surgeon: Pixie Casino, MD;  Location: [facility] ENDOSCOPY;  Service: Cardiovascular;  Laterality: N/A;   CARDIOVERSION N/A 08/24/2017   Procedure: CARDIOVERSION;  Surgeon: Skeet Latch, MD;  Location: [facility] ENDOSCOPY;  Service: Cardiovascular;  Laterality: N/A;   CARDIOVERSION N/A 02/14/2018   Procedure: CARDIOVERSION;  Surgeon: Larey Dresser, MD;  Location: [facility] ENDOSCOPY;  Service: Cardiovascular;  Laterality: N/A;   COLONOSCOPY     CRANIOTOMY N/A 07/28/2020   Procedure: CRANIOTOMY FOR EVACUATION OF SUBDURAL HEMATOMA;  Surgeon: Eustace Longnecker, MD;  Location: [facility];  Service: Neurosurgery;  Laterality: N/A;   IR ANGIO VERTEBRAL SEL SUBCLAVIAN INNOMINATE UNI R MOD SED  08/07/2020   IR CT HEAD LTD  08/07/2020   IR PERCUTANEOUS ART THROMBECTOMY/INFUSION INTRACRANIAL INC DIAG ANGIO  08/07/2020   laminectomies     10/1975 and in 03/2005   RADIOLOGY WITH ANESTHESIA N/A 08/07/2020   Procedure: IR WITH ANESTHESIA;  Surgeon: Luanne Bras, MD;  Location: [facility];  Service: Radiology;  Laterality: N/A;   TEE WITHOUT CARDIOVERSION N/A 12/18/2020   Procedure: TRANSESOPHAGEAL ECHOCARDIOGRAM (TEE);  Surgeon: Larey Dresser, MD;  Location: [facility] ENDOSCOPY;  Service: Cardiovascular;  Laterality: N/A;   [place] Right 04/03/2018   Procedure: TOTAL HIP ARTHROPLASTY ANTERIOR APPROACH;  Surgeon: Paralee Cancel, MD;  Location: [facility]S;  Service: Orthopedics;  Laterality: Right;  70 mins    Allergies  Allergen Reactions   Naproxen Hives   No [facility] Food Allergies Other (See Comments)    Scallops - distress, nausea and vomitting    Outpatient Encounter Medications as of 12/27/2021  Medication Sig   acetaminophen (TYLENOL) 325 MG tablet Take 650 mg by  mouth as needed for mild pain.   Amantadine HCl 100 MG tablet Take 100 mg by mouth once.   amLODipine (NORVASC) 5 MG tablet Take 5 mg by mouth daily.   amoxicillin (AMOXIL) 500 MG capsule Take 500 mg by mouth 2 (two) times daily.   apixaban (ELIQUIS) 5 MG TABS tablet Take 1 tablet (5 mg total) by mouth 2 (two) times daily.   Artificial Saliva (BIOTENE MOISTURIZING MOUTH MT) Use as directed 2 sprays in the mouth or throat in the morning, at noon, and at bedtime.   atorvastatin (LIPITOR) 80 MG tablet Take 1 tablet (80 mg total) by mouth daily.   bisacodyl (DULCOLAX) 10 MG suppository Place 1 suppository (10 mg total) rectally daily as needed for moderate constipation or mild constipation.   clobetasol (TEMOVATE) 0.05 % external solution Apply 1 application  topically as needed (itching).  diazePAM, 15 MG Dose, (VALTOCO 15 MG DOSE) 2 x 7.5 MG/0.1ML LQPK Place 7.5 mg into each nostril (total of 15 mg) for grand mal seizure lasting over 5 minutes. Can repeat once in 4 hours. Do Not use more than twice in 24 hours   dofetilide (TIKOSYN) 125 MCG capsule Take 3 capsules (375 mcg total) by mouth 2 (two) times daily.   famotidine (PEPCID) 20 MG tablet Take 20 mg by mouth at bedtime as needed for indigestion.   finasteride (PROSCAR) 5 MG tablet Take 1 tablet (5 mg total) by mouth daily.   Glucerna (GLUCERNA) LIQD Take 237 mLs by mouth daily.   hydrocortisone 2.5 % cream Apply 1 application. topically daily as needed (itching).   lacosamide (VIMPAT) 50 MG TABS tablet Take 1 tablet (50 mg total) by mouth in the morning.   lacosamide (VIMPAT) 50 MG TABS tablet Take 2 tablets (100 mg total) by mouth at bedtime.   levalbuterol (XOPENEX) 0.63 MG/3ML nebulizer solution Take 0.63 mg by nebulization every 6 (six) hours as needed for wheezing or shortness of breath.   magnesium gluconate (MAGONATE) 500 MG tablet Take 1 tablet (500 mg total) by mouth daily.   melatonin 5 MG TABS Take 5 mg by mouth at bedtime.    methenamine (MANDELAMINE) 1 g tablet Take 1,000 mg by mouth 2 (two) times daily.   methylphenidate (RITALIN) 5 MG tablet Take 1 tablet (5 mg total) by mouth 2 (two) times daily. Take at 7 am and 12 pm   Multiple Vitamin (MULTIVITAMIN WITH MINERALS) TABS tablet Take 1 tablet by mouth daily.   omeprazole (PRILOSEC) 20 MG capsule Take 20 mg by mouth daily.   Polyethyl Glycol-Propyl Glycol (SYSTANE) 0.4-0.3 % SOLN Place 2 drops into both eyes daily.   polyethylene glycol (MIRALAX / GLYCOLAX) 17 g packet Take 17 g by mouth every other day.   potassium chloride (KLOR-CON) 10 MEQ tablet Take 1 tablet (10 mEq total) by mouth daily.   senna-docusate (SENOKOT-S) 8.6-50 MG tablet Take 1 tablet by mouth at bedtime as needed.   torsemide (DEMADEX) 20 MG tablet Take 1 tablet (20 mg total) by mouth daily.   No facility-administered encounter medications on file as of 12/27/2021.    Review of Systems  Unable to perform ROS: Other    Immunization History  Administered Date(s) Administered   Fluad Quad(high Dose 65+) 12/06/2018, 12/09/2020, 12/10/2021   Influenza Whole 12/25/2008, 01/05/2010, 12/30/2011   Influenza, High Dose Seasonal PF 12/14/2016   Influenza,inj,Quad PF,6+ Mos 12/19/2013   Influenza,inj,quad, With Preservative 11/22/2017   Influenza-Unspecified 12/14/2015, 12/24/2019, 12/10/2021   Moderna Covid-19 Vaccine Bivalent Booster 23yr & up 01/12/2021   Moderna SARS-COV2 Booster Vaccination 01/02/2020, 06/26/2020   Moderna Sars-Covid-2 Vaccination 04/08/2019, 05/05/2019   Pneumococcal Conjugate-13 12/19/2013   Pneumococcal Polysaccharide-23 09/24/2007, 05/22/2017   Td 12/25/2008   Td (Adult), 2 Lf Tetanus Toxid, Preservative Free 12/25/2008   Tdap 03/22/2018   Zoster Recombinat (Shingrix) 07/16/2019, 10/28/2019   Zoster, Live 09/24/2007   Pertinent  Health Maintenance Due  Topic Date Due   OPHTHALMOLOGY EXAM  10/06/2022 (Originally 10/04/1950)   HEMOGLOBIN A1C  01/05/2022   FOOT EXAM   10/23/2022   INFLUENZA VACCINE  Completed   COLONOSCOPY (Pts [id number] coverage will need to be confirmed)  Discontinued      08/10/2021   11:10 PM 08/11/2021    8:00 AM 08/11/2021    7:30 PM 08/12/2021    8:00 AM 10/13/2021    2:32 PM  Fall  Risk  Falls in the past year?     0  Patient Fall Risk Level High fall risk High fall risk High fall risk High fall risk    Functional Status Survey:    Vitals:   12/27/21 1025  BP: 110/70  Pulse: 79  Resp: 17  Temp: (!) 97.3 F (36.3 C)  SpO2: 95%  Weight: 142 lb 6.4 oz (64.6 kg)  Height: '5\' 7"'$  (1.702 m)   Body mass index is 22.3 kg/m. Physical Exam Vitals reviewed.  Constitutional:      Appearance: Normal appearance.  HENT:     Head: Normocephalic.     Nose: Nose normal.     Mouth/Throat:     Mouth: Mucous membranes are moist.     Pharynx: Oropharynx is clear.  Eyes:     Pupils: Pupils are equal, round, and reactive to light.  Cardiovascular:     Rate and Rhythm: Normal rate and regular rhythm.     Pulses: Normal pulses.     Heart sounds: Murmur heard.  Pulmonary:     Effort: Pulmonary effort is normal. No respiratory distress.     Breath sounds: Normal breath sounds. No rales.  Abdominal:     General: Abdomen is flat. Bowel sounds are normal.     Palpations: Abdomen is soft.  Musculoskeletal:        General: No swelling.     Cervical back: Neck supple.  Skin:    General: Skin is warm.  Neurological:     Mental Status: He is alert.     Comments: More awake  Still does not follow commands Left hemiparesis  Psychiatric:        Mood and Affect: Mood normal.        Thought Content: Thought content normal.     Labs reviewed: Recent Labs    08/10/21 0937 08/10/21 0957 08/11/21 0408 08/12/21 0107 08/16/21 0000 12/06/21 0000 12/13/21 1157 12/14/21 0000 12/20/21 0000  NA 141   < > 140 142   < > 145  --  148* 142  K 3.8   < > 3.6 3.9   < > 4.1  --  4.8 3.6  CL 111  --  109 113*   < > 110*  --  108 103   CO2 16*  --  24 23   < > 21  --  23* 26*  GLUCOSE 148*  --  120* 143*  --   --   --   --   --   BUN 22  --  17 17   < > 33*  --  45* 28*  CREATININE 1.18  --  0.81 0.83   < > 0.9  --  1.0 0.9  CALCIUM 8.8*  --  8.6* 8.7*   < > 9.3  --  10.1 8.9  MG  --   --  1.9 2.0  --   --  2.4  --   --    < > = values in this interval not displayed.   Recent Labs    07/12/21 1056 08/10/21 0937 08/11/21 0408 09/07/21 0000 09/08/21 0000  AST '22 29 27 24 24  '$ ALT '22 26 23 18 18  '$ ALKPHOS  --  93 80 105 105  BILITOT 0.7 0.5 0.8  --   --   PROT 7.2 6.4* 5.8*  --   --   ALBUMIN  --  3.3* 3.1* 3.5 3.5   Recent Labs  07/12/21 1056 08/10/21 0937 08/10/21 0957 08/11/21 0408 08/12/21 0107 09/07/21 0000 11/01/21 0000 11/08/21 0000 12/06/21 0000  WBC 9.0 16.1*  --  9.1 10.2   < > 8.9 7.9 8.1  NEUTROABS 6,804 14.3*  --   --  7.8*  --   --   --   --   HGB 10.9* 10.0*   < > 9.4* 9.4*   < > 9.4* 9.9* 9.2*  HCT 33.9* 31.6*   < > 28.7* 29.8*   < > 28* 30* 28*  MCV 90.4 96.3  --  92.0 94.3  --   --   --   --   PLT 426* 238  --  218 227   < > 258 295 338   < > = values in this interval not displayed.   Lab Results  Component Value Date   TSH 1.773 01/24/2021   Lab Results  Component Value Date   HGBA1C 5.7 07/05/2021   Lab Results  Component Value Date   CHOL 110 07/05/2021   HDL 43 07/05/2021   LDLCALC 57 07/05/2021   TRIG 51 07/05/2021   CHOLHDL 2.8 08/08/2020    Significant Diagnostic Results in last 30 days:  CT Chest Wo Contrast  Result Date: 12/16/2021 CLINICAL DATA:  81 year old male with history of rheumatic fever with suspected aspiration. EXAM: CT CHEST WITHOUT CONTRAST TECHNIQUE: Multidetector CT imaging of the chest was performed following the standard protocol without IV contrast. RADIATION DOSE REDUCTION: This exam was performed according to the departmental dose-optimization program which includes automated exposure control, adjustment of the mA and/or kV according to  patient size and/or use of iterative reconstruction technique. COMPARISON:  No priors. FINDINGS: Cardiovascular: Heart size is enlarged with left atrial dilatation. There is no significant pericardial fluid, thickening or pericardial calcification. There is aortic atherosclerosis, as well as atherosclerosis of the great vessels of the mediastinum and the coronary arteries, including calcified atherosclerotic plaque in the left main, left anterior descending, left circumflex and right coronary arteries. Severe calcifications of the mitral annulus. Status post median sternotomy for aortic and mitral valve replacement with what appear to be a stented bioprosthesis. Aneurysmal dilatation of the ascending thoracic aorta (5.1 cm in diameter). Dilatation of the pulmonic trunk (4.8 cm in diameter). Mediastinum/Nodes: Multiple prominent borderline enlarged lymph nodes are noted throughout the mediastinal and hilar nodal stations measuring up to 1.3 cm in short axis in the low right paratracheal nodal station. Esophagus is unremarkable in appearance. No axillary lymphadenopathy. Lungs/Pleura: Widespread areas of ground-glass attenuation and mild interlobular septal thickening are noted throughout the lungs bilaterally. No confluent consolidative airspace disease. Small right pleural effusion. No left pleural effusion. No definite suspicious appearing pulmonary nodules or masses are noted on today's examination which is limited by considerable patient respiratory motion. Upper Abdomen: Aortic atherosclerosis. Musculoskeletal: Median sternotomy wires. There are no aggressive appearing lytic or blastic lesions noted in the visualized portions of the skeleton. IMPRESSION: 1. The appearance of the chest is most suggestive of congestive heart failure. Specifically, there is cardiomegaly with what appears to be mild interstitial pulmonary edema and small right pleural effusion. 2. Dilatation of the pulmonic trunk (4.8 cm in  diameter), concerning for pulmonary arterial hypertension. 3. Cardiomegaly with left atrial dilatation. 4. Aortic atherosclerosis, in addition to left main and three-vessel coronary artery disease. 5. Additional incidental findings, as above. Aortic Atherosclerosis (ICD10-I70.0). Electronically Signed   By: Vinnie Langton M.D.   On: 12/16/2021 06:42    Assessment/Plan  1. Lethargy He is was just started back on Ritalin 5 mg BID And now also on Vimpat 50 in AM and 100 mg PM Will wait till Thurs. Of he still stays lethargic have to back off on Vimpat dose again  2. Seizure ([place]) Continuous to have breakthrough Seizures Vimpat Dose just increased 3. Traumatic brain injury with loss of consciousness, subsequent encounter on Ritalin and Amantadine I discussed today again with wife about considering Comfort care for him as he now continuous to stay more lethargic and unable to progress Physically  4. Hypernatremia Sodium has been stable  5. Chronic systolic heart failure (HCC) Does not  look Volume Overload   Family/ staff Communication:   Labs/tests ordered:

## 2021-12-28 ENCOUNTER — Encounter: Payer: Self-pay | Admitting: Internal Medicine

## 2021-12-28 DIAGNOSIS — R41841 Cognitive communication deficit: Secondary | ICD-10-CM | POA: Diagnosis not present

## 2021-12-28 DIAGNOSIS — I69354 Hemiplegia and hemiparesis following cerebral infarction affecting left non-dominant side: Secondary | ICD-10-CM | POA: Diagnosis not present

## 2021-12-28 DIAGNOSIS — R41840 Attention and concentration deficit: Secondary | ICD-10-CM | POA: Diagnosis not present

## 2021-12-28 DIAGNOSIS — I69812 Visuospatial deficit and spatial neglect following other cerebrovascular disease: Secondary | ICD-10-CM | POA: Diagnosis not present

## 2021-12-28 DIAGNOSIS — R293 Abnormal posture: Secondary | ICD-10-CM | POA: Diagnosis not present

## 2021-12-28 DIAGNOSIS — H538 Other visual disturbances: Secondary | ICD-10-CM | POA: Diagnosis not present

## 2021-12-28 DIAGNOSIS — I69391 Dysphagia following cerebral infarction: Secondary | ICD-10-CM | POA: Diagnosis not present

## 2021-12-28 DIAGNOSIS — I5022 Chronic systolic (congestive) heart failure: Secondary | ICD-10-CM | POA: Diagnosis not present

## 2021-12-28 DIAGNOSIS — G4089 Other seizures: Secondary | ICD-10-CM | POA: Diagnosis not present

## 2021-12-28 DIAGNOSIS — R278 Other lack of coordination: Secondary | ICD-10-CM | POA: Diagnosis not present

## 2021-12-28 DIAGNOSIS — M6389 Disorders of muscle in diseases classified elsewhere, multiple sites: Secondary | ICD-10-CM | POA: Diagnosis not present

## 2021-12-28 DIAGNOSIS — S065X3S Traumatic subdural hemorrhage with loss of consciousness of 1 hour to 5 hours 59 minutes, sequela: Secondary | ICD-10-CM | POA: Diagnosis not present

## 2021-12-29 DIAGNOSIS — H538 Other visual disturbances: Secondary | ICD-10-CM | POA: Diagnosis not present

## 2021-12-29 DIAGNOSIS — S065X3S Traumatic subdural hemorrhage with loss of consciousness of 1 hour to 5 hours 59 minutes, sequela: Secondary | ICD-10-CM | POA: Diagnosis not present

## 2021-12-29 DIAGNOSIS — G4089 Other seizures: Secondary | ICD-10-CM | POA: Diagnosis not present

## 2021-12-29 DIAGNOSIS — I69391 Dysphagia following cerebral infarction: Secondary | ICD-10-CM | POA: Diagnosis not present

## 2021-12-29 DIAGNOSIS — R293 Abnormal posture: Secondary | ICD-10-CM | POA: Diagnosis not present

## 2021-12-29 DIAGNOSIS — M6389 Disorders of muscle in diseases classified elsewhere, multiple sites: Secondary | ICD-10-CM | POA: Diagnosis not present

## 2021-12-29 DIAGNOSIS — R41841 Cognitive communication deficit: Secondary | ICD-10-CM | POA: Diagnosis not present

## 2021-12-29 DIAGNOSIS — I69354 Hemiplegia and hemiparesis following cerebral infarction affecting left non-dominant side: Secondary | ICD-10-CM | POA: Diagnosis not present

## 2021-12-29 DIAGNOSIS — I69812 Visuospatial deficit and spatial neglect following other cerebrovascular disease: Secondary | ICD-10-CM | POA: Diagnosis not present

## 2021-12-29 DIAGNOSIS — R41840 Attention and concentration deficit: Secondary | ICD-10-CM | POA: Diagnosis not present

## 2021-12-29 DIAGNOSIS — R278 Other lack of coordination: Secondary | ICD-10-CM | POA: Diagnosis not present

## 2021-12-29 DIAGNOSIS — I5022 Chronic systolic (congestive) heart failure: Secondary | ICD-10-CM | POA: Diagnosis not present

## 2021-12-30 ENCOUNTER — Telehealth: Payer: Self-pay | Admitting: Adult Health

## 2021-12-30 DIAGNOSIS — M6389 Disorders of muscle in diseases classified elsewhere, multiple sites: Secondary | ICD-10-CM | POA: Diagnosis not present

## 2021-12-30 DIAGNOSIS — R41841 Cognitive communication deficit: Secondary | ICD-10-CM | POA: Diagnosis not present

## 2021-12-30 DIAGNOSIS — I69391 Dysphagia following cerebral infarction: Secondary | ICD-10-CM | POA: Diagnosis not present

## 2021-12-30 DIAGNOSIS — R278 Other lack of coordination: Secondary | ICD-10-CM | POA: Diagnosis not present

## 2021-12-30 DIAGNOSIS — H538 Other visual disturbances: Secondary | ICD-10-CM | POA: Diagnosis not present

## 2021-12-30 DIAGNOSIS — R293 Abnormal posture: Secondary | ICD-10-CM | POA: Diagnosis not present

## 2021-12-30 DIAGNOSIS — S065X3S Traumatic subdural hemorrhage with loss of consciousness of 1 hour to 5 hours 59 minutes, sequela: Secondary | ICD-10-CM | POA: Diagnosis not present

## 2021-12-30 DIAGNOSIS — I69812 Visuospatial deficit and spatial neglect following other cerebrovascular disease: Secondary | ICD-10-CM | POA: Diagnosis not present

## 2021-12-30 DIAGNOSIS — G4089 Other seizures: Secondary | ICD-10-CM | POA: Diagnosis not present

## 2021-12-30 DIAGNOSIS — I5022 Chronic systolic (congestive) heart failure: Secondary | ICD-10-CM | POA: Diagnosis not present

## 2021-12-30 DIAGNOSIS — R41840 Attention and concentration deficit: Secondary | ICD-10-CM | POA: Diagnosis not present

## 2021-12-30 DIAGNOSIS — I69354 Hemiplegia and hemiparesis following cerebral infarction affecting left non-dominant side: Secondary | ICD-10-CM | POA: Diagnosis not present

## 2021-12-30 MED ORDER — LACOSAMIDE 10 MG/ML PO SOLN: 50.0000 mg | Freq: Every morning | ORAL | 0 refills | Status: DC

## 2021-12-30 MED ORDER — LACOSAMIDE 10 MG/ML PO SOLN: 75.0000 mg | Freq: Every day | ORAL | 0 refills | Status: DC

## 2021-12-30 NOTE — Telephone Encounter (Signed)
Wife reports patient talking less and having more lethargy after taking higher dose of vimpat. Will try reducing evening dose and using liquid. Can try to up dose over time again if needed.

## 2021-12-31 DIAGNOSIS — I69812 Visuospatial deficit and spatial neglect following other cerebrovascular disease: Secondary | ICD-10-CM | POA: Diagnosis not present

## 2021-12-31 DIAGNOSIS — R278 Other lack of coordination: Secondary | ICD-10-CM | POA: Diagnosis not present

## 2021-12-31 DIAGNOSIS — H538 Other visual disturbances: Secondary | ICD-10-CM | POA: Diagnosis not present

## 2021-12-31 DIAGNOSIS — S065X3S Traumatic subdural hemorrhage with loss of consciousness of 1 hour to 5 hours 59 minutes, sequela: Secondary | ICD-10-CM | POA: Diagnosis not present

## 2021-12-31 DIAGNOSIS — I69391 Dysphagia following cerebral infarction: Secondary | ICD-10-CM | POA: Diagnosis not present

## 2021-12-31 DIAGNOSIS — I5022 Chronic systolic (congestive) heart failure: Secondary | ICD-10-CM | POA: Diagnosis not present

## 2021-12-31 DIAGNOSIS — R293 Abnormal posture: Secondary | ICD-10-CM | POA: Diagnosis not present

## 2021-12-31 DIAGNOSIS — M6389 Disorders of muscle in diseases classified elsewhere, multiple sites: Secondary | ICD-10-CM | POA: Diagnosis not present

## 2021-12-31 DIAGNOSIS — R41841 Cognitive communication deficit: Secondary | ICD-10-CM | POA: Diagnosis not present

## 2021-12-31 DIAGNOSIS — G4089 Other seizures: Secondary | ICD-10-CM | POA: Diagnosis not present

## 2021-12-31 DIAGNOSIS — I69354 Hemiplegia and hemiparesis following cerebral infarction affecting left non-dominant side: Secondary | ICD-10-CM | POA: Diagnosis not present

## 2021-12-31 DIAGNOSIS — R41840 Attention and concentration deficit: Secondary | ICD-10-CM | POA: Diagnosis not present

## 2022-01-03 ENCOUNTER — Ambulatory Visit (HOSPITAL_BASED_OUTPATIENT_CLINIC_OR_DEPARTMENT_OTHER)
Admission: RE | Admit: 2022-01-03 | Discharge: 2022-01-03 | Disposition: A | Discharge status: Home / Self Care | Payer: Medicare Other | Source: Ambulatory Visit | Attending: Cardiology | Admitting: Cardiology

## 2022-01-03 ENCOUNTER — Ambulatory Visit (HOSPITAL_COMMUNITY)
Admission: RE | Admit: 2022-01-03 | Discharge: 2022-01-03 | Disposition: A | Discharge status: Home / Self Care | Payer: Medicare Other | Source: Ambulatory Visit | Attending: Internal Medicine | Admitting: Internal Medicine

## 2022-01-03 ENCOUNTER — Other Ambulatory Visit: Payer: Self-pay

## 2022-01-03 ENCOUNTER — Encounter (HOSPITAL_COMMUNITY): Payer: Self-pay | Admitting: Cardiology

## 2022-01-03 VITALS — BP 102/60 | HR 90

## 2022-01-03 DIAGNOSIS — G4089 Other seizures: Secondary | ICD-10-CM | POA: Diagnosis not present

## 2022-01-03 DIAGNOSIS — Z953 Presence of xenogenic heart valve: Secondary | ICD-10-CM | POA: Diagnosis not present

## 2022-01-03 DIAGNOSIS — R41841 Cognitive communication deficit: Secondary | ICD-10-CM | POA: Diagnosis not present

## 2022-01-03 DIAGNOSIS — Z79899 Other long term (current) drug therapy: Secondary | ICD-10-CM | POA: Diagnosis not present

## 2022-01-03 DIAGNOSIS — I480 Paroxysmal atrial fibrillation: Secondary | ICD-10-CM

## 2022-01-03 DIAGNOSIS — Z7401 Bed confinement status: Secondary | ICD-10-CM | POA: Insufficient documentation

## 2022-01-03 DIAGNOSIS — I5042 Chronic combined systolic (congestive) and diastolic (congestive) heart failure: Secondary | ICD-10-CM | POA: Insufficient documentation

## 2022-01-03 DIAGNOSIS — I440 Atrioventricular block, first degree: Secondary | ICD-10-CM | POA: Insufficient documentation

## 2022-01-03 DIAGNOSIS — R41840 Attention and concentration deficit: Secondary | ICD-10-CM | POA: Diagnosis not present

## 2022-01-03 DIAGNOSIS — Z792 Long term (current) use of antibiotics: Secondary | ICD-10-CM | POA: Diagnosis not present

## 2022-01-03 DIAGNOSIS — I5022 Chronic systolic (congestive) heart failure: Secondary | ICD-10-CM

## 2022-01-03 DIAGNOSIS — Z8673 Personal history of transient ischemic attack (TIA), and cerebral infarction without residual deficits: Secondary | ICD-10-CM | POA: Diagnosis not present

## 2022-01-03 DIAGNOSIS — I484 Atypical atrial flutter: Secondary | ICD-10-CM | POA: Diagnosis not present

## 2022-01-03 DIAGNOSIS — I4819 Other persistent atrial fibrillation: Secondary | ICD-10-CM | POA: Diagnosis not present

## 2022-01-03 DIAGNOSIS — R293 Abnormal posture: Secondary | ICD-10-CM | POA: Diagnosis not present

## 2022-01-03 DIAGNOSIS — I69812 Visuospatial deficit and spatial neglect following other cerebrovascular disease: Secondary | ICD-10-CM | POA: Diagnosis not present

## 2022-01-03 DIAGNOSIS — R278 Other lack of coordination: Secondary | ICD-10-CM | POA: Diagnosis not present

## 2022-01-03 DIAGNOSIS — I69391 Dysphagia following cerebral infarction: Secondary | ICD-10-CM | POA: Diagnosis not present

## 2022-01-03 DIAGNOSIS — I110 Hypertensive heart disease with heart failure: Secondary | ICD-10-CM | POA: Diagnosis not present

## 2022-01-03 DIAGNOSIS — I080 Rheumatic disorders of both mitral and aortic valves: Secondary | ICD-10-CM | POA: Insufficient documentation

## 2022-01-03 DIAGNOSIS — S065X3S Traumatic subdural hemorrhage with loss of consciousness of 1 hour to 5 hours 59 minutes, sequela: Secondary | ICD-10-CM | POA: Diagnosis not present

## 2022-01-03 DIAGNOSIS — Z7901 Long term (current) use of anticoagulants: Secondary | ICD-10-CM | POA: Insufficient documentation

## 2022-01-03 DIAGNOSIS — E785 Hyperlipidemia, unspecified: Secondary | ICD-10-CM | POA: Insufficient documentation

## 2022-01-03 DIAGNOSIS — I5032 Chronic diastolic (congestive) heart failure: Secondary | ICD-10-CM

## 2022-01-03 DIAGNOSIS — I69354 Hemiplegia and hemiparesis following cerebral infarction affecting left non-dominant side: Secondary | ICD-10-CM | POA: Diagnosis not present

## 2022-01-03 DIAGNOSIS — M6389 Disorders of muscle in diseases classified elsewhere, multiple sites: Secondary | ICD-10-CM | POA: Diagnosis not present

## 2022-01-03 DIAGNOSIS — I38 Endocarditis, valve unspecified: Secondary | ICD-10-CM | POA: Diagnosis not present

## 2022-01-03 DIAGNOSIS — H538 Other visual disturbances: Secondary | ICD-10-CM | POA: Diagnosis not present

## 2022-01-03 LAB — COMPREHENSIVE METABOLIC PANEL
ALT: 20 U/L (ref 0–44)
AST: 25 U/L (ref 15–41)
Albumin: 3.6 g/dL (ref 3.5–5.0)
Alkaline Phosphatase: 100 U/L (ref 38–126)
Anion gap: 11 (ref 5–15)
BUN: 27 mg/dL — ABNORMAL HIGH (ref 8–23)
CO2: 25 mmol/L (ref 22–32)
Calcium: 9.1 (ref 8.7–10.7)
Calcium: 9.1 mg/dL (ref 8.9–10.3)
Chloride: 108 mmol/L (ref 98–111)
Creatinine, Ser: 1.18 mg/dL (ref 0.61–1.24)
GFR, Estimated: >60 mL/min (ref >60)
Glucose, Bld: 120 mg/dL — ABNORMAL HIGH (ref 70–99)
Potassium: 3.7 mmol/L (ref 3.5–5.1)
Sodium: 144 mmol/L (ref 135–145)
Total Bilirubin: 1.1 mg/dL (ref 0.3–1.2)
Total Protein: 7.4 g/dL (ref 6.5–8.1)
eGFR: 76

## 2022-01-03 LAB — ECHOCARDIOGRAM COMPLETE
AR max vel: 1.26 cm2
AV Area VTI: 1.31 cm2
AV Area mean vel: 1.29 cm2
AV Mean grad: 39 mmHg
AV Peak grad: 64 mmHg
Ao pk vel: 4 m/s
Area-P 1/2: 0.87 cm2
MV VTI: 1.08 cm2
S' Lateral: 2.3 cm

## 2022-01-03 LAB — BASIC METABOLIC PANEL
BUN: 27 — AB (ref 4–21)
CO2: 26 — AB (ref 13–22)
Chloride: 108 (ref 99–108)
Creatinine: 1 (ref 0.6–1.3)
Glucose: 102
Potassium: 3.7 mEq/L (ref 3.5–5.1)
Sodium: 148 — AB (ref 137–147)

## 2022-01-03 LAB — BRAIN NATRIURETIC PEPTIDE: B Natriuretic Peptide: 204.1 pg/mL — ABNORMAL HIGH (ref 0.0–100.0)

## 2022-01-03 IMAGING — CT CT HEAD W/O CM
3 of 4 series · 16 of 47 positions shown, 19 images · non-contrast
Comparison: 08/12/2020

CLINICAL DATA: Stroke follow-up

EXAM:
CT HEAD WITHOUT CONTRAST
TECHNIQUE: Contiguous axial images were obtained from the base of the skull
through the vertex without intravenous contrast.

[Series 3: head 5.0 h30s · axial · 0.43mm/px · z∈[-62,+83]mm · 10 of 35 slices shown, 13 images]
[im 3/35  brain]
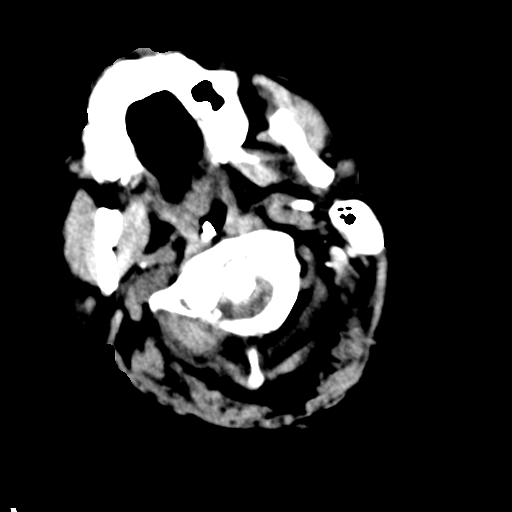
[im 3/35  bone]
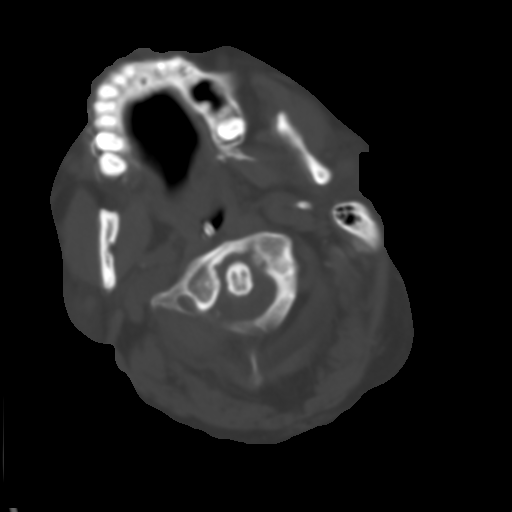
[im 5/35  brain]
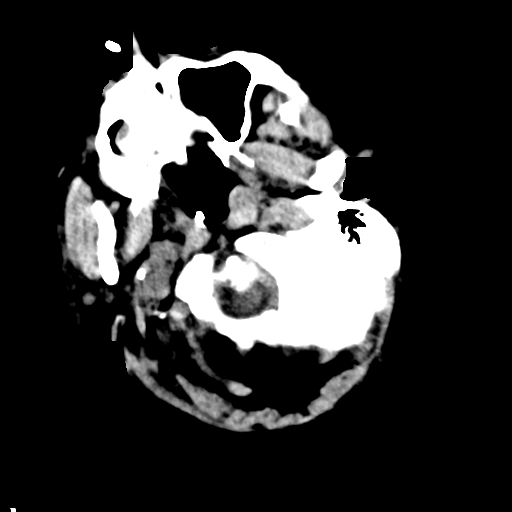
[im 10/35  brain]
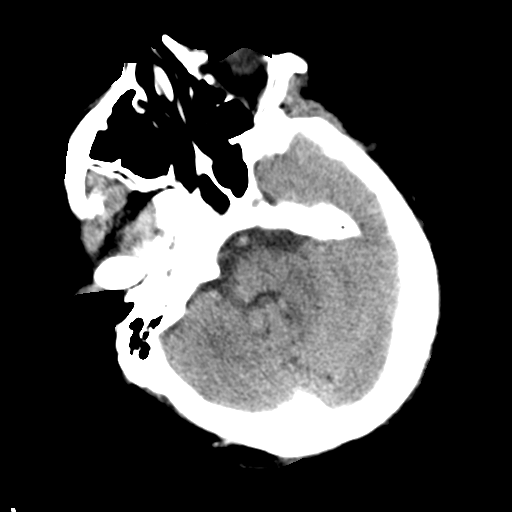
[im 13/35  brain]
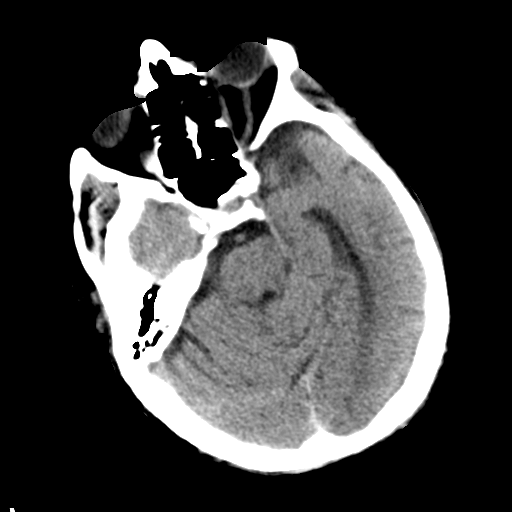
[im 15/35  brain]
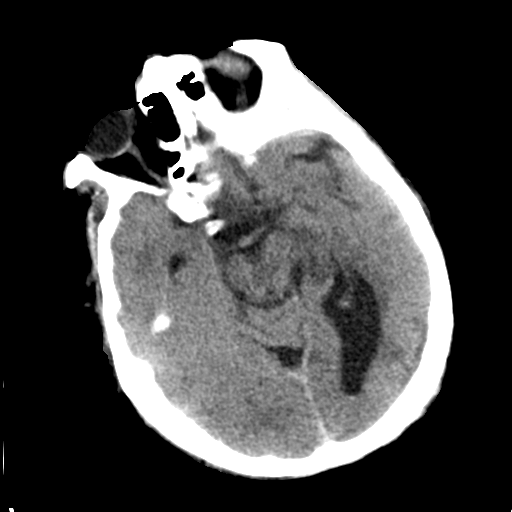
[im 15/35  bone]
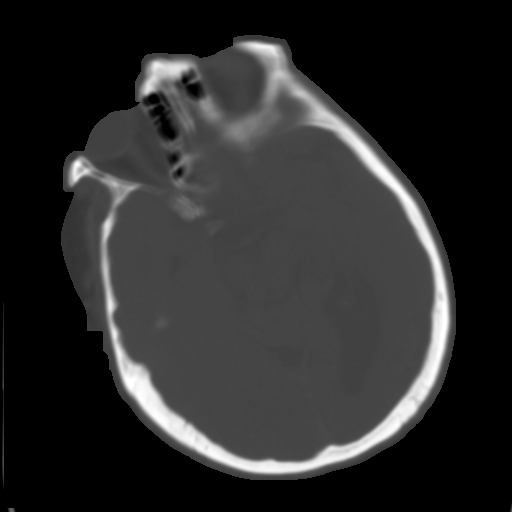
[im 20/35  brain]
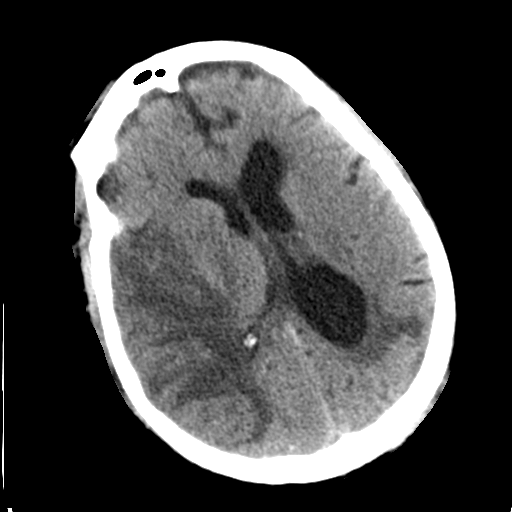
[im 22/35  brain]
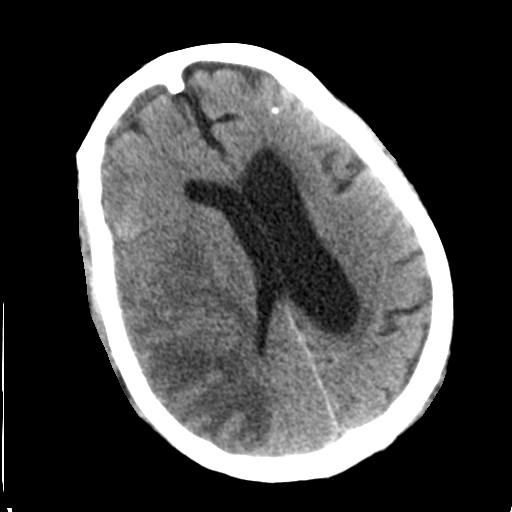
[im 25/35  brain]
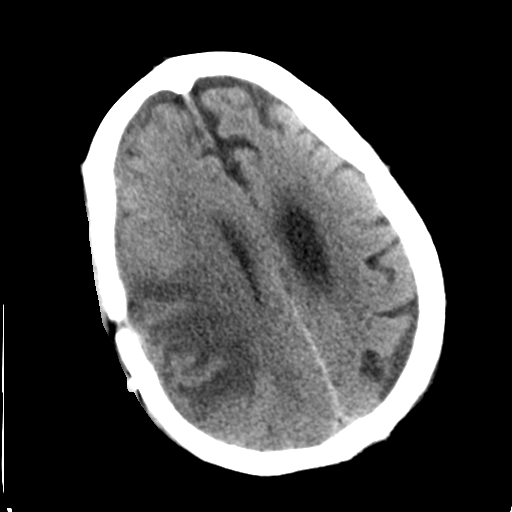
[im 30/35  brain]
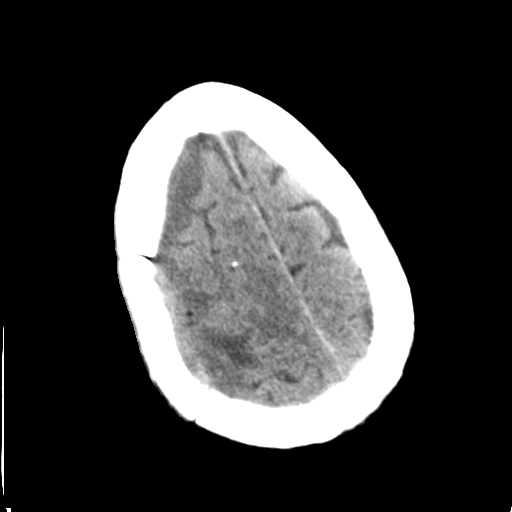
[im 30/35  bone]
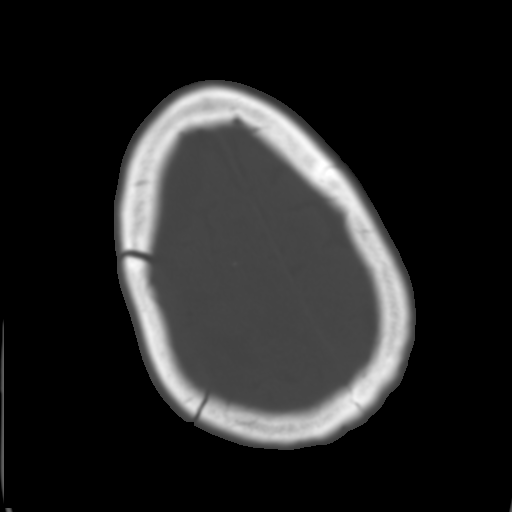
[im 32/35  brain]
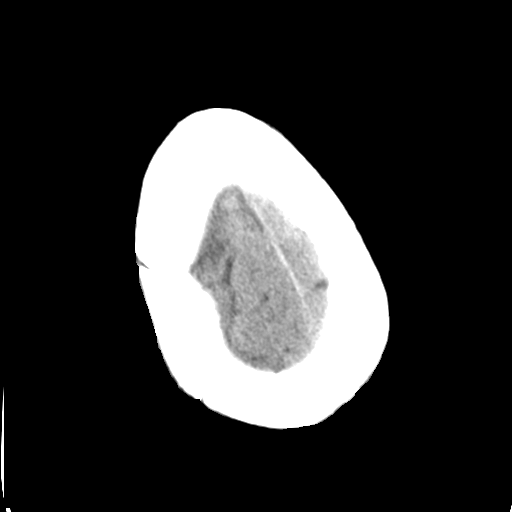

[Series 5: head 3.0 mpr cor · coronal · 0.40mm/px · 3 of 73 slices shown]
[im 25/73  brain]
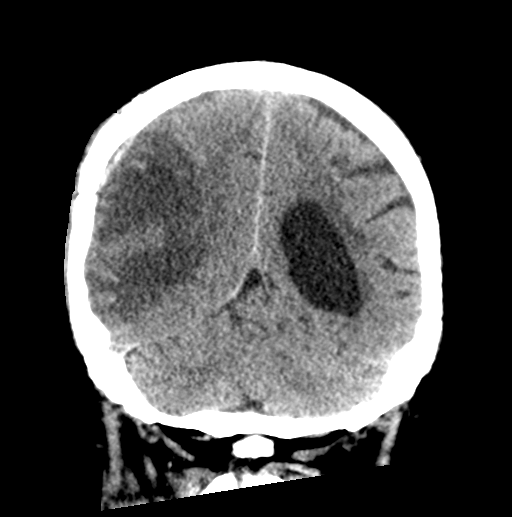
[im 33/73  brain]
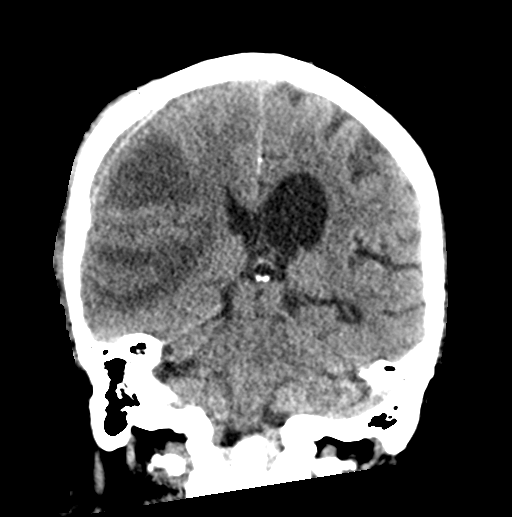
[im 41/73  brain]
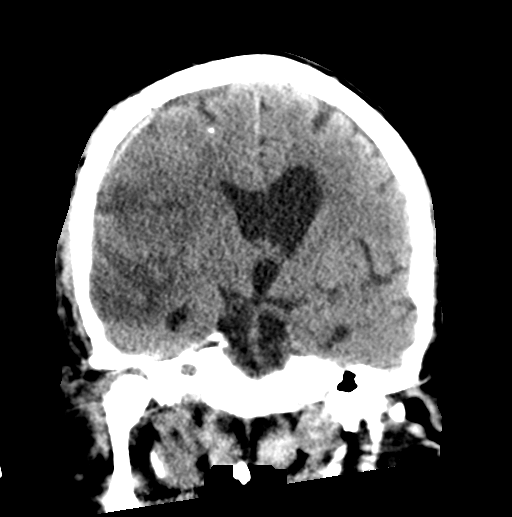

[Series 6: head 3.0 mpr sag · sagittal · 0.36mm/px · 3 of 67 slices shown]
[im 23/67  brain]
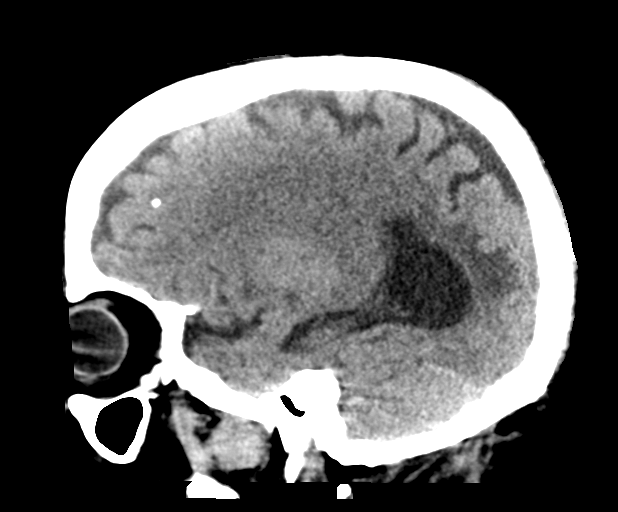
[im 34/67  brain]
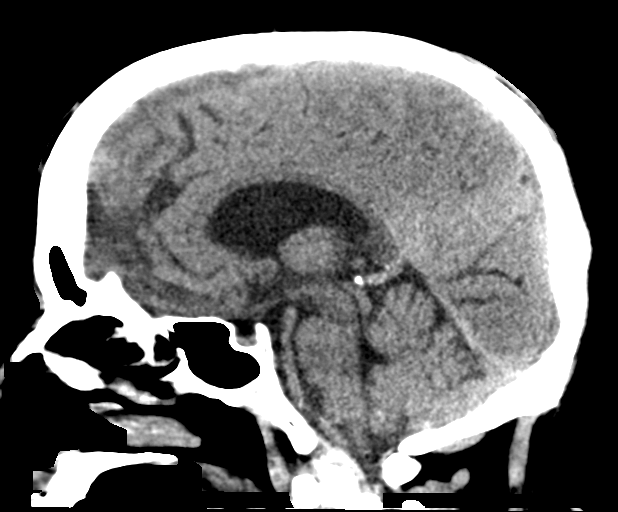
[im 45/67  brain]
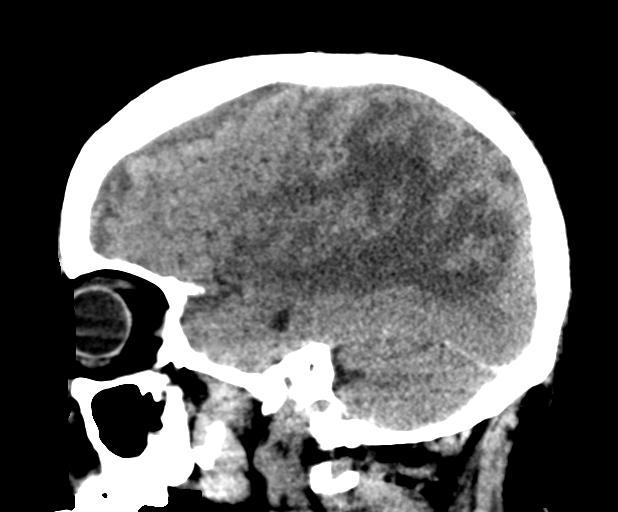

[16 of 47 positions shown; findings below may reference images not displayed]

FINDINGS: Brain: Large, recent right MCA territory infarct is unchanged. There
is slightly increased leftward midline shift measuring 7 mm. Small
right hemispheric subdural hematoma is unchanged in thickness. No
new site of hemorrhage. Hypodensity in the posterior left parietal
lobe is unchanged.

Vascular: No abnormal hyperdensity of the major intracranial
arteries or dural venous sinuses. No intracranial atherosclerosis.

Skull: Recent right sided craniotomy.

Sinuses/Orbits: No fluid levels or advanced mucosal thickening of
the visualized paranasal sinuses. No mastoid or middle ear effusion.
The orbits are normal.
IMPRESSION: 1. Slightly increased leftward midline shift measuring 7 mm.
Otherwise unchanged appearance of right MCA territory infarct.
2. Unchanged small right hemispheric subdural hematoma.

## 2022-01-03 MED ORDER — TORSEMIDE 20 MG PO TABS: ORAL_TABLET | ORAL | 3 refills | Status: DC

## 2022-01-03 MED ORDER — POTASSIUM CHLORIDE ER 10 MEQ PO TBCR: 20.0000 meq | EXTENDED_RELEASE_TABLET | Freq: Every day | ORAL | 3 refills | Status: DC

## 2022-01-03 MED ORDER — AMLODIPINE BESYLATE 5 MG PO TABS: 2.5000 mg | ORAL_TABLET | Freq: Every day | ORAL | 1 refills | Status: DC

## 2022-01-03 NOTE — Progress Notes (Signed)
Date:  01/03/2022   ID:  [name], DOB 10/02/1940, MRN [id number]    Provider location: [facility] Type of Visit: Established patient  PCP:  [name], MD  Cardiologist:  Dr. [name]   History of Present Illness: [name] [name] is a 81 y.o. 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  He also has an ascending aortic aneurysm.   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  I started him on dronedarone and coumadin with plan for cardioversion after 4 wks of therapeutic anticoagulation if he did not convert back to NSR on his own.  He went back into NSR spontaneously and later stopped coumadin. Atrial flutter recurred in 3/15 and again resolved spontaneously.   In 6/15, patient was admitted with transient dysarthria (resolved in about 10 minutes).  However, he was found to have evidence for CVA by MRI.  Therefore, he was started on coumadin. He was noted to be in atypical atrial flutter again.  Ranolazine was added to dronedarone.     In 8/15, Mr Simonin was seen in consultation at the [facility] clinic.  He had atrial flutter ablation at [facility] in 11/15.     He was seen in followup at [facility] in 2/16.  Ranolazine was stopped.  Echo in 2/16 showed EF 55% with stable bioprosthetic mitral and aortic valves.  CTA chest showed stable 5 cm ascending aortic aneurysm.  He went back into atypical atrial flutter in 4/16.  Ranolazine was restarted and he was cardioverted back to NSR.      Cardiolite in 12/18 showed no ischemia. He wore a 30 day monitor in 12/18 with no arrhythmia.     He went into atrial flutter again during a respiratory illness in 2/19.  On 04/24/17, he was cardioverted back to NSR.  He thinks that he went into atrial flutter again later on that day. He was back in atrial flutter at followup  appointment. He was then admitted for Tikosyn loading in 4/19 and was cardioverted back to NSR after the load.    He had atrial fibrillation again in 6/19 with DCCV.    He was seen at the [facility] in 9/19.  Echo showed normal LV and RV systolic function, bioprosthetic MV with mean gradient up to 9 mmHg, normal bioprosthetic AoV.  CTA chest showed aortic root up to 5.3 cm.    He was back in atrial flutter in 12/19, had DCCV back to NSR.  Echo in 12/19 showed EF 60-65%, normal bioprosthetic aortic valve, bioprosthetic mitral valve with mean gradient 4 mmHg and no MR, severe LAE, aortic root 4.8 cm.   He went to the [facility] in 3/20.  Echo at that time showed EF 60-65% with stable bioprosthetic mitral and aortic valves (mean gradient 7 mmHg across MV and 9 mmHg across AoV).  CTA showed 5.0 cm aortic root and ascending aorta, stable.  He returned to the [facility] in 7/20.  He had atrial fibrillation/flutter ablation again and thinks he has been in NSR since that time.  CTA chest showed 5.1 cm ascending thoracic aorta. Seen at [facility] in 7/21.  Echo showed stable bioprosthetic mitral and aortic valves with normal EF.  CTA chest showed 5.0 cm ascending aortic aneurysm.   Last visit  at [facility] clinic 1/22 he was doing well, exercising and euvolemic in NSR.  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 He subsequently developed right MCA territory stroke (as Eliquis was held) in June 2022, requiring thrombectomy and was discharged to SNF with significant disability.    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 Initial echo showed EF 65-70%, no obvious vegetation. Cards consulted and TEE showed bioprosthetic MV vegetation with trivial MR and mean gradient 8.  He is not a candidate for mitral valve  replacement.  ID consulted he was started on Rocephin/ampicillin via PICC with a stop date of 01/28/21 then continue on amoxicillin 500 mg bid indefinitely. Hospital course complicated by prolonged encephalopathy.  Neurology consulted and MRI brian showed new left cerebellar vermis CVA, likely septic emboli in the setting of mitral valve endocarditis.  He was discharged back to SNF, weight 187 lbs.  He was readmitted in 5/22 with volume overload and was diuresed. He had transient atrial fibrillation but went back into NSR.   Echo in 1/23 on my review showed  EF 50%, mild-moderate LVH, mildly decreased RV systolic function, bioprosthetic mitral valve appeared to have vegetation present with mild MR, mean gradient 8 mmHg (mild-moderate bioprosthetic valve stenosis), bioprosthetic aortic valve with mean gradient 9 mmHg and no AI, the bioprosthetic aortic valve appeared thickened, 5.1 cm ascending aorta.  Blood cultures were done and showing no growth.  ESR and CRP were normal.  It was suspected that the vegetation noted was sterile.   Echo was done today and reviewed.  It looks significantly worse in terms of valvular function.  EF 55%, D-shaped septum suggesting RV pressure/volume overload, moderate RVE with moderate RV dysfunction, bioprosthetic mitral valve thickened and appears to have vegetation, mean MV gradient 20 mmHg with MVA 1.1 cm2 suggesting severe bioprosthetic mitral valve stenosis, mild MR; bioprosthetic aortic valve was thickened with mean gradient 39 and AVA 1.3 cm2, DI 0.26, moderate-severe bioprosthetic AoV stenosis, IVC dilated.   He returns today for followup of diastolic CHF and mitral valve endocarditis.  He wears 2 L oxygen at all times.  He is awake but does not respond to me.  Per his wife, he gets very tachypneic at times.  His abdomen has been distended.  Unable to get a weight as he is confined to the wheelchair. No fever or cough.  Recent CT chest showed evidence for CHF.   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, QTc 494 msec   Labs (3/14): LDL 64, TSH normal, K 3.9, creatinine 1.0     Labs (6/15): LDL 54, HDL 48, creatinine 0.95   Labs (9/15): LDL 54, HDL 25 Labs (10/15): K 4.5, creatinine 1.3, AST 45, ALT 64 Labs (4/16): K 4.3, creatinine 1.28, HCT 40.7 Labs (5/17): K 4.1, creatinine 1.16, LDL 66, HDL 47 Labs (2/18): LDL 42, HDL 43, K 4.4, creatinine 1.48 Labs (3/18): hgb 13.8 Labs (2/19): LDL 59 Labs (4/19): K 3.9, creatinine 0.97, Mg 2.0 Labs (6/19): K 4.4, creatinine 0.9 Labs (10/19): LDL 84 Labs (12/19): K 4.2, creatinine 1.13, hgb 13.9 Labs (3/20): K 4.1, creatinine 0.98 Labs (6/20): K 4.1, creatinine 0.91, LDL 103  Labs (9/20): K 4, creatinine 0.95 Labs (1/21): K 4, creatinine 0.94, LDL 80 Labs (5/21): K 4.3, creatinine 0.92 Labs (11/22): K 3.2, creatinine 0.6 => 0.88, WBCs 13, ESR 70 Labs (1/23): hgb 10.2,  K 4, creatinine 0.6, AST/ALT normal Labs (3/23): ESR 11, CRP 2.7 Labs (4/23): K 4.3, creatinine 0.68 Labs (9/23): K 4.3, creatinine 0.9 Labs (10/23): K 3.7, creatinine 0.9                                                                                                                              PMH: 1. Atrial fibrillation: s/p MAZE and LAA ligation in 2006.  No documented atrial fibrillation since operation.  2. Atypical atrial flutter: 10/13, 3/15, and again in 6/15.  Recurrent 4/16 with DCCV.  - Event monitor (12/18): no significant arrhythmia.  - Recurrence in 2/19: DCCV to NSR but quickly recurred.  - Admitted in 4/19 for Tikosyn loading and cardioverted to NSR.  - DCCV to NSR in 6/19.  - DCCV to NSR in 12/19. - 7/20 redo atrial flutter/fibrillation ablation.   3. 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   - Echo 5/11 with EF 60-65%, normal bioprosthetic aortic valve, normal bioprosthetic mitral valve, moderate LAE.   - Echo 11/12 at [facility] looked ok  per report.  - Echo (10/13) with EF 55-60%, mild LVH, normal bioprosthetic aortic and mitral valves, moderate LAE, PA systolic pressure 35 mmHg.   - Echo (6/15) with EF 60%, moderate LVH, normal-appearing bioprosthetic MV and AoV, PA systolic pressure 32 mmHg.   - Echo 9/15 [facility]) with EF 55%, mild LVH, PASP 52 mmHg, severe LAE, bioprosthetic mitral valve with no MR and mean gradient 11 mmHg, moderate TR, bioprosthetic aortic valve with no AI and mean gradient 9 mmHg, dilated ascending aorta to 5 cm.   - Echo (2/16, [facility]) with EF 55%, stable bioprosthetic aortic and mitral valves (AoV mean gradient 7 mmHg, MV mean gradient 4 mmHg), normal RV size and systolic function.  - Echo (12/16, [facility]) with EF 58%, mild RV dilation with low normal systolic function, PA systolic pressure 34 mmHg, bioprosthetic MV and AoV functioning normally.  - Echo (5/18, [facility]) with EF 60-65%, RV normal size and systolic function, moderate LAE, bioprosthetic MV mean gradient 6 with no MR, bioprosthetic aortic valve with mean gradient 11 and no AI, PASP 41 mmHg.  - Echo (2/19): EF 60-65%, moderate LVH, bioprosthetic aortic and mitral valves functioning normally, PASP 23 mmHg, aortic root 4.5 cm.  - Echo (9/19): EF 56%, normal RV size and systolic function, bioprosthetic MV with mean gradient 9 mmHg, trivial MR, bioprosthetic aortic valve with mean gradient 12 mmHg, PASP 51 mmHg.  - Echo (12/19): EF 60-65%, normal bioprosthetic aortic valve, bioprosthetic mitral valve with mean gradient 4 mmHg and no MR, severe LAE, aortic root 4.8 cm.  - Echo (3/20, [facility]): EF 60-65%, normal RV, severe LAE, 5.0 cm ascending aorta, bioprosthetic MV with mean gradient 7 mmHg, bioprosthetic aortic valve with mean gradient 9 mmHg.  - Echo (7/20, [facility]): EF 55-60%, moderate septal hypertrophy,  normal RV size and systolic function, bioprosthetic MV with trace MR and mean gradient 4,  bioprosthetic aortic valve with mean gradient 14 mmHg, severe LAE.  - Echo (7/21, [facility]): EF 60%, normal RV, s/p bioprosthetic mitral valve with trace MR and mean gradient 5, s/p bioprosthetic aortic valve with trace AI and mean gradient 15 mmHg, ascending aorta 4.9 cm.  - Echo (10/22): EF 65-70%, normal RV, bioprosthetic AVR mean gradient 16 mmHg appears normal, bioprosthetic MV mean gradient 10, increased from prior but HR also in 100s when study done.   - TEE 10/22 bioprosthetic MV vegetation with trivial MR and mean gradient 8.  - Echo (1/23): EF 50%, mild-moderate LVH, mildly decreased RV systolic function, bioprosthetic mitral valve appears to have vegetation present with mild MR, mean gradient 8 mmHg (mild-moderate bioprosthetic valve stenosis), bioprosthetic aortic valve with mean gradient 9 mmHg and no AI, the bioprosthetic aortic valve appears thickened, 5.1 cm ascending aorta.  - Echo (10/23): EF 55%, D-shaped septum suggesting RV pressure/volume overload, moderate RVE with moderate RV dysfunction, bioprosthetic mitral valve thickened and appears to have vegetation, mean MV gradient 20 mmHg with MVA 1.1 cm2 suggesting severe bioprosthetic mitral valve stenosis, mild MR; bioprosthetic aortic valve was thickened with mean gradient 39 and AVA 1.3 cm2, DI 0.26, moderate-severe bioprosthetic AoV stenosis, IVC dilated.  4. Hyperlipidemia 5. L-spine surgery 6. LHC with no significant coronary disease in 2006 pre-op.  - Cardiolite (12/18): No ischemia.  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 - SDH 6/22 with craniotomy followed by MCA CVA due to occluded ICA while off Eliquis (revascularized).  - 10/22: Small left cerebellar infarct possibly embolic from MV endocarditis. 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  CTA chest 9/15 with dilated sinuses of valsalva to 5.0 cm and ascending aorta 4.9 cm. CTA chest (2/16) with 5.0 cm ascending  aorta.  CTA chest (12/16) with 4.9 cm aortic root at SOV, 4.8 cm ascending aorta.  - CT chest (5/18, [facility]): 5.0 cm aortic root, 5.0 cm ascending aorta.  - CTA chest (10/19, [facility]): 5.3 cm aortic root, 5.2 cm ascending aorta.  - CTA chest (3/20, [facility]): 5.0 cm aortic root and ascending aorta - CTA chest (7/20, [facility]): 5.1 cm ascending aorta - CTA chest (7/21, [facility]): 5.1 cm aortic root, 5.0 cm ascending aorta.  9. 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 Subdural hematoma 5/22 with craniotomy 12. 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 Thrombectomy was done.   Current Outpatient Medications  Medication Sig Dispense Refill   acetaminophen (TYLENOL) 325 MG tablet Take 650 mg by mouth as needed for mild pain.     Amantadine HCl 100 MG tablet Take 100 mg by mouth once.     amoxicillin (AMOXIL) 500 MG capsule Take 500 mg by mouth 2 (two) times daily.     apixaban (ELIQUIS) 5 MG TABS tablet Take 1 tablet (5 mg total) by mouth 2 (two) times daily. 60 tablet 0   Artificial Saliva (BIOTENE MOISTURIZING MOUTH MT) Use as directed 2 sprays in the mouth or throat in the morning, at noon, and at bedtime.     atorvastatin (LIPITOR) 80 MG tablet Take 1 tablet (80 mg total) by mouth daily.     bisacodyl (DULCOLAX) 10 MG suppository Place 1 suppository (10 mg total) rectally daily as needed for moderate constipation or mild constipation.     clobetasol (TEMOVATE) 0.05 %  external solution Apply 1 application  topically as needed (itching).     diazePAM, 15 MG Dose, (VALTOCO 15 MG DOSE) 2 x 7.5 MG/0.1ML LQPK Place 7.5 mg into each nostril (total of 15 mg) for grand mal seizure lasting over 5 minutes. Can repeat once in 4 hours. Do Not use more than twice in 24 hours 2 each 1   dofetilide (TIKOSYN) 125 MCG capsule Take 3 capsules (375 mcg total) by mouth 2 (two) times daily.     famotidine (PEPCID) 20 MG tablet Take  20 mg by mouth at bedtime as needed for indigestion.     finasteride (PROSCAR) 5 MG tablet Take 1 tablet (5 mg total) by mouth daily. 30 tablet 0   Glucerna (GLUCERNA) LIQD Take 237 mLs by mouth daily.     hydrocortisone 2.5 % cream Apply 1 application. topically daily as needed (itching).     lacosamide (VIMPAT) 10 MG/ML oral solution Take 5 mLs (50 mg total) by mouth in the morning. 600 mL 0   lacosamide (VIMPAT) 10 MG/ML oral solution Take 7.5 mLs (75 mg total) by mouth at bedtime. 600 mL 0   levalbuterol (XOPENEX) 0.63 MG/3ML nebulizer solution Take 0.63 mg by nebulization every 6 (six) hours as needed for wheezing or shortness of breath.     magnesium gluconate (MAGONATE) 500 MG tablet Take 1 tablet (500 mg total) by mouth daily. 30 tablet 0   melatonin 5 MG TABS Take 5 mg by mouth at bedtime.     methenamine (MANDELAMINE) 1 g tablet Take 1,000 mg by mouth 2 (two) times daily.     methylphenidate (RITALIN) 5 MG tablet Take 1 tablet (5 mg total) by mouth 2 (two) times daily. Take at 7 am and 12 pm 60 tablet 0   Multiple Vitamin (MULTIVITAMIN WITH MINERALS) TABS tablet Take 1 tablet by mouth daily.     omeprazole (PRILOSEC) 20 MG capsule Take 20 mg by mouth daily.     Polyethyl Glycol-Propyl Glycol (SYSTANE) 0.4-0.3 % SOLN Place 2 drops into both eyes daily.     polyethylene glycol (MIRALAX / GLYCOLAX) 17 g packet Take 17 g by mouth every other day. 14 each    senna-docusate (SENOKOT-S) 8.6-50 MG tablet Take 1 tablet by mouth at bedtime as needed.     amLODipine (NORVASC) 5 MG tablet Take 0.5 tablets (2.5 mg total) by mouth daily. 60 tablet 1   potassium chloride (KLOR-CON) 10 MEQ tablet Take 2 tablets (20 mEq total) by mouth daily. 90 tablet 3   torsemide (DEMADEX) 20 MG tablet Take 2 tablets (40 mg total) by mouth in the morning AND 1 tablet (20 mg total) every evening. 90 tablet 3   No current facility-administered medications for this encounter.    Allergies:   Naproxen and No  healthtouch food allergies   Social History:  The patient  reports that he has never smoked. He has never used smokeless tobacco. He reports that he does not drink alcohol and does not use drugs.   Family History:  The patient's family history includes Cancer in his father; Diabetes in his brother and paternal aunt; Heart attack in his maternal grandfather; Macular degeneration in his mother.   ROS:  Please see the history of present illness.   All other systems are personally reviewed and negative.   Exam:   BP 102/60   Pulse 90   SpO2 96% Comment: 2 l n/c General: NAD Neck: JVP 8-9 cm, no thyromegaly or thyroid nodule.  Lungs: Occasional rhonchi.  CV: Nondisplaced PMI.  Heart regular S1/S2, no S3/S4, 3/6 SEM RUSB, soft diastolic murmur along the sternal border.  1+ ankle edema.  No carotid bruit.  Normal pedal pulses.  Abdomen: Soft, nontender, no hepatosplenomegaly, no distention.  Skin: Intact without lesions or rashes.  Neurologic: Alert but will not follow my commands.  Extremities: No clubbing or cyanosis.  HEENT: Normal.   Wt Readings from Last 3 Encounters:  12/27/21 64.6 kg (142 lb 6.4 oz)  12/24/21 64.3 kg (141 lb 12.8 oz)  12/20/21 62.9 kg (138 lb 9.6 oz)    ASSESSMENT AND PLAN 1. Chronic Diastolic Heart Failure: Echo today shows EF 55%, D-shaped interventricular septum, moderate RVE and RV dysfunction, severe bioprosthetic MV stenosis and moderate-severe bioprosthetic AoV stenosis. He is tachypneic with an oxygen requirement.   - Increase torsemide to 40 qam/20 qpm x 3 days then 40 mg daily.  BMET/BNP today and BMET in 10 days.  - Increase KCl to 20 mEq daily.  2. 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  Baseline, he is bed-bound, left hemiparesis. Also in 10/22 had new small left cerebellar infarct possibly embolic from endocarditis.  He is awake but minimally interactive today with me.  3.  Enterococcal bacteremia: Source uncertain.  No PNA noted on CXR.  Enterococcus did not grow in urine.  Patient had bioprosthetic MV endocarditis by 10/22 TEE, valve function not markedly abnormal (mean gradient elevated at 8 with trivial MR).  He had a new left cerebellar CVA in 10/22 (small) that may have been embolic from MV vegetation.  With prior sternotomy and minimal mobility, he is not a candidate for redo valve surgery.  He completed 6 week course of ceftriaxone/ampicillin.  My review of 1/23 echo showed vegetation still present on mitral valve with mean gradient 8 mmHg and mild MR, and the aortic valve is thickened but not definitive vegetation. Blood cultures in 1/23 were negative, and ESR and CRP were normal. Echo today showed vegetation and thickening of MV with thickening also of AoV though no definite mobile vegetation noted.  I am worried about indolent endocarditis.  - Check blood cultures.  - Continue amoxicillin long-term.  4. 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  Echo in today showed bioprosthetic AVR to be thickened with moderate-severe stenosis.  Bioprosthetic MV was thickened with vegetation present, severe bioprosthetic MV stenosis.  He is not a candidate for surgery and also would be a poor candidate for percutaneous valve replacement.  Options are quite limited, will be medical management with diuretics.   - We discussed hospice, I think he would be a candidate.  Family wants to think about this.  5. Atrial fibrillation/Flutter: Maze in 2006 + 2 flutter/fib ablations at [facility]. Remains on decreased Tikosyn 375 mcg bid due to previously long QT. 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  - Continue Tikosyn 375 mcg bid. BMET today.  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  - Continue Eliquis.  6. Hyperlipidemia: He is on atorvastatin.  7. HTN:  BP on the low side.  - Decrease amlodipine to 2.5 mg daily.    Followup in 6 wks.   Loralie Champagne 01/03/2022  [facility] [address] [facility] [place] [address] [phone] (office) [phone] (fax)

## 2022-01-03 NOTE — Patient Instructions (Addendum)
IN CREASE Torsemide to 40 mg in the am and 20 mg in the PM Then back to 40 mg daily  INCREASE Potassium to 20 meq daily  DECREASE Amlodipine to 2.5 mg daily  Repeat lab work in 10 days  Labs done today, your results will be available in MyChart, we will contact you for abnormal readings.  Your physician recommends that you schedule a follow-up appointment in: 6 weeks   If you have any questions or concerns before your next appointment please send [place] a message through [place] or call our office at [phone].    TO LEAVE A MESSAGE FOR THE NURSE SELECT OPTION 2, PLEASE LEAVE A MESSAGE INCLUDING: YOUR NAME DATE OF BIRTH CALL BACK NUMBER REASON FOR CALL**this is important as we prioritize the call backs  YOU WILL RECEIVE A CALL BACK THE SAME DAY AS LONG AS YOU CALL BEFORE 4:00 PM  At the [facility], you and your health needs are our priority. As part of our continuing mission to provide you with exceptional heart care, we have created designated Provider Care Teams. These Care Teams include your primary Cardiologist (physician) and Advanced Practice Providers (APPs- Physician Assistants and Nurse Practitioners) who all work together to provide you with the care you need, when you need it.   You may see any of the following providers on your designated Care Team at your next follow up: Dr Glori Bickers Dr Loralie Champagne Dr. Roxana Hires, NP Lyda Jester, [place] [facility], [place] Forestine Na, NP Audry Riles, PharmD   Please be sure to bring in all your medications bottles to every appointment.

## 2022-01-04 DIAGNOSIS — H538 Other visual disturbances: Secondary | ICD-10-CM | POA: Diagnosis not present

## 2022-01-04 DIAGNOSIS — R41841 Cognitive communication deficit: Secondary | ICD-10-CM | POA: Diagnosis not present

## 2022-01-04 DIAGNOSIS — R278 Other lack of coordination: Secondary | ICD-10-CM | POA: Diagnosis not present

## 2022-01-04 DIAGNOSIS — R293 Abnormal posture: Secondary | ICD-10-CM | POA: Diagnosis not present

## 2022-01-04 DIAGNOSIS — I5022 Chronic systolic (congestive) heart failure: Secondary | ICD-10-CM | POA: Diagnosis not present

## 2022-01-04 DIAGNOSIS — I69812 Visuospatial deficit and spatial neglect following other cerebrovascular disease: Secondary | ICD-10-CM | POA: Diagnosis not present

## 2022-01-04 DIAGNOSIS — M6389 Disorders of muscle in diseases classified elsewhere, multiple sites: Secondary | ICD-10-CM | POA: Diagnosis not present

## 2022-01-04 DIAGNOSIS — I69354 Hemiplegia and hemiparesis following cerebral infarction affecting left non-dominant side: Secondary | ICD-10-CM | POA: Diagnosis not present

## 2022-01-04 DIAGNOSIS — G4089 Other seizures: Secondary | ICD-10-CM | POA: Diagnosis not present

## 2022-01-04 DIAGNOSIS — R41840 Attention and concentration deficit: Secondary | ICD-10-CM | POA: Diagnosis not present

## 2022-01-04 DIAGNOSIS — S065X3S Traumatic subdural hemorrhage with loss of consciousness of 1 hour to 5 hours 59 minutes, sequela: Secondary | ICD-10-CM | POA: Diagnosis not present

## 2022-01-04 DIAGNOSIS — I69391 Dysphagia following cerebral infarction: Secondary | ICD-10-CM | POA: Diagnosis not present

## 2022-01-05 DIAGNOSIS — I69391 Dysphagia following cerebral infarction: Secondary | ICD-10-CM | POA: Diagnosis not present

## 2022-01-05 DIAGNOSIS — R41841 Cognitive communication deficit: Secondary | ICD-10-CM | POA: Diagnosis not present

## 2022-01-05 DIAGNOSIS — Z961 Presence of intraocular lens: Secondary | ICD-10-CM | POA: Diagnosis not present

## 2022-01-05 DIAGNOSIS — I69354 Hemiplegia and hemiparesis following cerebral infarction affecting left non-dominant side: Secondary | ICD-10-CM | POA: Diagnosis not present

## 2022-01-05 DIAGNOSIS — G4089 Other seizures: Secondary | ICD-10-CM | POA: Diagnosis not present

## 2022-01-06 DIAGNOSIS — S065X3S Traumatic subdural hemorrhage with loss of consciousness of 1 hour to 5 hours 59 minutes, sequela: Secondary | ICD-10-CM | POA: Diagnosis not present

## 2022-01-06 DIAGNOSIS — M6389 Disorders of muscle in diseases classified elsewhere, multiple sites: Secondary | ICD-10-CM | POA: Diagnosis not present

## 2022-01-06 DIAGNOSIS — R278 Other lack of coordination: Secondary | ICD-10-CM | POA: Diagnosis not present

## 2022-01-06 DIAGNOSIS — R293 Abnormal posture: Secondary | ICD-10-CM | POA: Diagnosis not present

## 2022-01-06 DIAGNOSIS — R41841 Cognitive communication deficit: Secondary | ICD-10-CM | POA: Diagnosis not present

## 2022-01-06 DIAGNOSIS — I5022 Chronic systolic (congestive) heart failure: Secondary | ICD-10-CM | POA: Diagnosis not present

## 2022-01-06 DIAGNOSIS — I69391 Dysphagia following cerebral infarction: Secondary | ICD-10-CM | POA: Diagnosis not present

## 2022-01-06 DIAGNOSIS — R41840 Attention and concentration deficit: Secondary | ICD-10-CM | POA: Diagnosis not present

## 2022-01-06 DIAGNOSIS — I69812 Visuospatial deficit and spatial neglect following other cerebrovascular disease: Secondary | ICD-10-CM | POA: Diagnosis not present

## 2022-01-06 DIAGNOSIS — G4089 Other seizures: Secondary | ICD-10-CM | POA: Diagnosis not present

## 2022-01-06 DIAGNOSIS — H538 Other visual disturbances: Secondary | ICD-10-CM | POA: Diagnosis not present

## 2022-01-06 DIAGNOSIS — I69354 Hemiplegia and hemiparesis following cerebral infarction affecting left non-dominant side: Secondary | ICD-10-CM | POA: Diagnosis not present

## 2022-01-07 ENCOUNTER — Non-Acute Institutional Stay (SKILLED_NURSING_FACILITY): Payer: Medicare Other | Admitting: Adult Health

## 2022-01-07 DIAGNOSIS — E118 Type 2 diabetes mellitus with unspecified complications: Secondary | ICD-10-CM | POA: Diagnosis not present

## 2022-01-07 DIAGNOSIS — T826XXD Infection and inflammatory reaction due to cardiac valve prosthesis, subsequent encounter: Secondary | ICD-10-CM

## 2022-01-07 DIAGNOSIS — G8114 Spastic hemiplegia affecting left nondominant side: Secondary | ICD-10-CM

## 2022-01-07 DIAGNOSIS — H538 Other visual disturbances: Secondary | ICD-10-CM | POA: Diagnosis not present

## 2022-01-07 DIAGNOSIS — I69391 Dysphagia following cerebral infarction: Secondary | ICD-10-CM | POA: Diagnosis not present

## 2022-01-07 DIAGNOSIS — R41841 Cognitive communication deficit: Secondary | ICD-10-CM | POA: Diagnosis not present

## 2022-01-07 DIAGNOSIS — R278 Other lack of coordination: Secondary | ICD-10-CM | POA: Diagnosis not present

## 2022-01-07 DIAGNOSIS — R1312 Dysphagia, oropharyngeal phase: Secondary | ICD-10-CM

## 2022-01-07 DIAGNOSIS — G4730 Sleep apnea, unspecified: Secondary | ICD-10-CM | POA: Diagnosis not present

## 2022-01-07 DIAGNOSIS — R569 Unspecified convulsions: Secondary | ICD-10-CM

## 2022-01-07 DIAGNOSIS — E870 Hyperosmolality and hypernatremia: Secondary | ICD-10-CM

## 2022-01-07 DIAGNOSIS — M6389 Disorders of muscle in diseases classified elsewhere, multiple sites: Secondary | ICD-10-CM | POA: Diagnosis not present

## 2022-01-07 DIAGNOSIS — N401 Enlarged prostate with lower urinary tract symptoms: Secondary | ICD-10-CM | POA: Diagnosis not present

## 2022-01-07 DIAGNOSIS — S069X3S Unspecified intracranial injury with loss of consciousness of 1 hour to 5 hours 59 minutes, sequela: Secondary | ICD-10-CM | POA: Diagnosis not present

## 2022-01-07 DIAGNOSIS — S065X3S Traumatic subdural hemorrhage with loss of consciousness of 1 hour to 5 hours 59 minutes, sequela: Secondary | ICD-10-CM | POA: Diagnosis not present

## 2022-01-07 DIAGNOSIS — N138 Other obstructive and reflux uropathy: Secondary | ICD-10-CM

## 2022-01-07 DIAGNOSIS — R41840 Attention and concentration deficit: Secondary | ICD-10-CM | POA: Diagnosis not present

## 2022-01-07 DIAGNOSIS — G4089 Other seizures: Secondary | ICD-10-CM | POA: Diagnosis not present

## 2022-01-07 DIAGNOSIS — I10 Essential (primary) hypertension: Secondary | ICD-10-CM

## 2022-01-07 DIAGNOSIS — I482 Chronic atrial fibrillation, unspecified: Secondary | ICD-10-CM

## 2022-01-07 DIAGNOSIS — I69354 Hemiplegia and hemiparesis following cerebral infarction affecting left non-dominant side: Secondary | ICD-10-CM | POA: Diagnosis not present

## 2022-01-07 DIAGNOSIS — I69812 Visuospatial deficit and spatial neglect following other cerebrovascular disease: Secondary | ICD-10-CM | POA: Diagnosis not present

## 2022-01-07 DIAGNOSIS — J9611 Chronic respiratory failure with hypoxia: Secondary | ICD-10-CM

## 2022-01-07 DIAGNOSIS — I5022 Chronic systolic (congestive) heart failure: Secondary | ICD-10-CM

## 2022-01-07 DIAGNOSIS — E785 Hyperlipidemia, unspecified: Secondary | ICD-10-CM

## 2022-01-07 DIAGNOSIS — R293 Abnormal posture: Secondary | ICD-10-CM | POA: Diagnosis not present

## 2022-01-07 DIAGNOSIS — K5901 Slow transit constipation: Secondary | ICD-10-CM

## 2022-01-07 DIAGNOSIS — I38 Endocarditis, valve unspecified: Secondary | ICD-10-CM

## 2022-01-08 ENCOUNTER — Encounter: Payer: Self-pay | Admitting: Adult Health

## 2022-01-08 LAB — CULTURE, BLOOD (SINGLE)
Culture: NO GROWTH
Special Requests: ADEQUATE

## 2022-01-08 NOTE — Progress Notes (Addendum)
Location:  Occupational psychologist of Service:  SNF (31) Provider:   Cindi Carbon, [place] [phone]   [name], MD  Patient Care Team: [name], MD as PCP - General (Internal Medicine) Larey Dresser, MD as PCP - Cardiology (Cardiology) [name], MD as Consulting Physician (Ophthalmology) [name], MD (Internal Medicine)  Extended Emergency Contact Information Primary Emergency Contact: [name] Address: [address]          [place], [place] [address] [place] Phone: [phone] Mobile Phone: [phone] Relation: Spouse Secondary Emergency Contact: [name], [place] [address] [name] of [place] Phone: [phone] Relation: Son  Code Status:  DNR Goals of care: Advanced Directive information    12/27/2021   10:36 AM  Advanced Directives  Does Patient Have a Medical Advance Directive? Yes  Type of Paramedic of [place];Living will  Does patient want to make changes to medical advance directive? No - Patient declined  Copy of [place] in Chart? Yes - validated most recent copy scanned in chart (See row information)     Chief Complaint  Patient presents with   Medical Management of Chronic Issues    HPI:  Pt is a 81 y.o. male seen today for an acute visit for seizure.   PMH includes syncope with fall in May of 2022, TBI with SDH with craniotomy, acute right MCA s/p thrombectomy, bioprosthetic mitral and aortic valve replacement, DM II, diastiolic CHF, BPH, and afib.  On life long amoxicillin for endocarditis Echo 03/31/21 EF 45-50%  He has had several seizures in the past two months involving eye rolling, twitching rigidity. VImpat dose was increased but lethargy worsened. So now he is on 50 in the morning 75 in the evening. Last event 10/20.  DM II: diet controlled CBGS 133-143 Lab  Results  Component Value Date   HGBA1C 5.7 07/05/2021   HTN BP soft 100-120/60-80  Has periods of constipation and gas bloating. Seems to improve with movement such as lying on his left and working with OT.  Having BMs daily  Back on Ritalin for lethargy related to TBI, remains sleepy at times. Talking less over time. Still able to communicate with a few words a times and hand signals.   Also seen by cardiology Dr [name] 12/13/21 felt to have systolic CHF, torsemide increased to daily.  CT of the chest non contrast 12/14/21 showed congestive heart failure, cardiomegaly, small right effusion, and dilatation of the pulmonary trunk 4.8 cm suggestive pulmonary htn.  BNP 372.1 on 12/13/21 Seen again 01/03/22 by Dr Deatra Robinson. BNP 204.1.  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  Recommendation made by cardiology to increase torsemide and maintain unless NA >150 and also to consider hospice due to valvular stenosis/not a surgical candidate. Also concern for indolent indocarditis. Blood cx neg 10/30 x 1 He has a dark yellow toenail on the right great toe. No redness.   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   External hemorrhoids without mention of complication    H/O aortic valve replacement    H/O mitral valve replacement    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   Lesion of ulnar nerve    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    Other and unspecified hyperlipidemia    Pre-diabetes    Previous  back surgery 1978, jan 2007   Psychosexual dysfunction with inhibited sexual excitement    SOB (shortness of breath)    "with heavy exercise"   Stroke ([place]) 08/2013   "I WAS IN AFIB AND THREW A CLOT, THE EFFECTS WERE TRANSITORY AND DIDNT LAST BUT FOR 30 MINUTES"    Thoracic aortic aneurysm [facility])    Past Surgical History:  Procedure Laterality Date   AORTIC AND MITRAL VALVE REPLACEMENT     09/2004   CARDIOVERSION     3 times from 2004-2006   CARDIOVERSION N/A 09/26/2013   Procedure: CARDIOVERSION;  Surgeon: Larey Dresser, MD;  Location: [place];  Service: Cardiovascular;  Laterality: N/A;   CARDIOVERSION N/A 06/19/2014   Procedure: CARDIOVERSION;  Surgeon: Jerline Pain, MD;  Location: [place];  Service: Cardiovascular;  Laterality: N/A;   CARDIOVERSION N/A 04/24/2017   Procedure: CARDIOVERSION;  Surgeon: Larey Dresser, MD;  Location: [place];  Service: Cardiovascular;  Laterality: N/A;   CARDIOVERSION N/A 06/08/2017   Procedure: CARDIOVERSION;  Surgeon: Pixie Casino, MD;  Location: [facility] ENDOSCOPY;  Service: Cardiovascular;  Laterality: N/A;   CARDIOVERSION N/A 08/24/2017   Procedure: CARDIOVERSION;  Surgeon: Skeet Latch, MD;  Location: [facility] ENDOSCOPY;  Service: Cardiovascular;  Laterality: N/A;   CARDIOVERSION N/A 02/14/2018   Procedure: CARDIOVERSION;  Surgeon: Larey Dresser, MD;  Location: [facility] ENDOSCOPY;  Service: Cardiovascular;  Laterality: N/A;   COLONOSCOPY     CRANIOTOMY N/A 07/28/2020   Procedure: CRANIOTOMY FOR EVACUATION OF SUBDURAL HEMATOMA;  Surgeon: Eustace Keena, MD;  Location: [place];  Service: Neurosurgery;  Laterality: N/A;   IR ANGIO VERTEBRAL SEL SUBCLAVIAN INNOMINATE UNI R MOD SED  08/07/2020   IR CT HEAD LTD  08/07/2020   IR PERCUTANEOUS ART THROMBECTOMY/INFUSION INTRACRANIAL INC DIAG ANGIO  08/07/2020   laminectomies     10/1975 and in 03/2005   RADIOLOGY WITH ANESTHESIA N/A 08/07/2020   Procedure: IR WITH ANESTHESIA;  Surgeon: Luanne Bras,  MD;  Location: [place];  Service: Radiology;  Laterality: N/A;   TEE WITHOUT CARDIOVERSION N/A 12/18/2020   Procedure: TRANSESOPHAGEAL ECHOCARDIOGRAM (TEE);  Surgeon: Larey Dresser, MD;  Location: [facility] ENDOSCOPY;  Service: Cardiovascular;  Laterality: N/A;   [place] Right 04/03/2018   Procedure: TOTAL HIP ARTHROPLASTY ANTERIOR APPROACH;  Surgeon: Paralee Cancel, MD;  Location: [facility];  Service: Orthopedics;  Laterality: Right;  70 mins    Allergies  Allergen Reactions   Naproxen Hives   No [facility] Food Allergies Other (See Comments)    Scallops - distress, nausea and vomitting    Outpatient Encounter Medications as of 01/07/2022  Medication Sig   acetaminophen (TYLENOL) 325 MG tablet Take 650 mg by mouth as needed for mild pain.   Amantadine HCl 100 MG tablet Take 100 mg by mouth once.   amLODipine (NORVASC) 5 MG tablet Take 0.5 tablets (2.5 mg total) by mouth daily.   amoxicillin (AMOXIL) 500 MG capsule Take  500 mg by mouth 2 (two) times daily.   apixaban (ELIQUIS) 5 MG TABS tablet Take 1 tablet (5 mg total) by mouth 2 (two) times daily.   Artificial Saliva (BIOTENE MOISTURIZING MOUTH MT) Use as directed 2 sprays in the mouth or throat in the morning, at noon, and at bedtime.   atorvastatin (LIPITOR) 80 MG tablet Take 1 tablet (80 mg total) by mouth daily.   bisacodyl (DULCOLAX) 10 MG suppository Place 1 suppository (10 mg total) rectally daily as needed for moderate constipation or mild constipation.   clobetasol (TEMOVATE) 0.05 % external solution Apply 1 application  topically as needed (itching).   diazePAM, 15 MG Dose, (VALTOCO 15 MG DOSE) 2 x 7.5 MG/0.1ML LQPK Place 7.5 mg into each nostril (total of 15 mg) for grand mal seizure lasting over 5 minutes. Can repeat once in 4 hours. Do Not use more than twice in 24 hours   dofetilide (TIKOSYN) 125 MCG capsule Take 3 capsules (375 mcg total) by mouth 2 (two) times daily.   famotidine (PEPCID) 20  MG tablet Take 20 mg by mouth at bedtime as needed for indigestion.   finasteride (PROSCAR) 5 MG tablet Take 1 tablet (5 mg total) by mouth daily.   Glucerna (GLUCERNA) LIQD Take 237 mLs by mouth daily.   hydrocortisone 2.5 % cream Apply 1 application. topically daily as needed (itching).   lacosamide (VIMPAT) 10 MG/ML oral solution Take 5 mLs (50 mg total) by mouth in the morning.   lacosamide (VIMPAT) 10 MG/ML oral solution Take 7.5 mLs (75 mg total) by mouth at bedtime.   levalbuterol (XOPENEX) 0.63 MG/3ML nebulizer solution Take 0.63 mg by nebulization every 6 (six) hours as needed for wheezing or shortness of breath.   magnesium gluconate (MAGONATE) 500 MG tablet Take 1 tablet (500 mg total) by mouth daily.   melatonin 5 MG TABS Take 5 mg by mouth at bedtime.   methenamine (MANDELAMINE) 1 g tablet Take 1,000 mg by mouth 2 (two) times daily.   methylphenidate (RITALIN) 5 MG tablet Take 1 tablet (5 mg total) by mouth 2 (two) times daily. Take at 7 am and 12 pm   Multiple Vitamin (MULTIVITAMIN WITH MINERALS) TABS tablet Take 1 tablet by mouth daily.   omeprazole (PRILOSEC) 20 MG capsule Take 20 mg by mouth daily.   Polyethyl Glycol-Propyl Glycol (SYSTANE) 0.4-0.3 % SOLN Place 2 drops into both eyes daily.   polyethylene glycol (MIRALAX / GLYCOLAX) 17 g packet Take 17 g by mouth every other day.   potassium chloride (KLOR-CON) 10 MEQ tablet Take 2 tablets (20 mEq total) by mouth daily.   senna-docusate (SENOKOT-S) 8.6-50 MG tablet Take 1 tablet by mouth at bedtime as needed.   torsemide (DEMADEX) 20 MG tablet Take 2 tablets (40 mg total) by mouth in the morning AND 1 tablet (20 mg total) every evening.   No facility-administered encounter medications on file as of 01/07/2022.    Review of Systems  Unable to perform ROS: Mental status change    Immunization History  Administered Date(s) Administered   Fluad Quad(high Dose 65+) 12/06/2018, 12/09/2020, 12/10/2021   Influenza Whole  12/25/2008, 01/05/2010, 12/30/2011   Influenza, High Dose Seasonal PF 12/14/2016   Influenza,inj,Quad PF,6+ Mos 12/19/2013   Influenza,inj,quad, With Preservative 11/22/2017   Influenza-Unspecified 12/14/2015, 12/24/2019, 12/10/2021   Moderna Covid-19 Vaccine Bivalent Booster 16yr & up 01/12/2021   Moderna SARS-COV2 Booster Vaccination 01/02/2020, 06/26/2020   Moderna Sars-Covid-2 Vaccination 04/08/2019, 05/05/2019   Pneumococcal Conjugate-13 12/19/2013  Pneumococcal Polysaccharide-23 09/24/2007, 05/22/2017   Td 12/25/2008   Td (Adult), 2 Lf Tetanus Toxid, Preservative Free 12/25/2008   Tdap 03/22/2018   Zoster Recombinat (Shingrix) 07/16/2019, 10/28/2019   Zoster, Live 09/24/2007   Pertinent  Health Maintenance Due  Topic Date Due   HEMOGLOBIN A1C  01/05/2022   OPHTHALMOLOGY EXAM  10/06/2022 (Originally 10/04/1950)   FOOT EXAM  10/23/2022   INFLUENZA VACCINE  Completed   COLONOSCOPY (Pts 45-57yr Insurance coverage will need to be confirmed)  Discontinued      08/10/2021   11:10 PM 08/11/2021    8:00 AM 08/11/2021    7:30 PM 08/12/2021    8:00 AM 10/13/2021    2:32 PM  FGunnisonin the past year?     0  Patient Fall Risk Level High fall risk High fall risk High fall risk High fall risk    Functional Status Survey:    Vitals:   01/08/22 0819  Weight: 140 lb 6.4 oz (63.7 kg)   Body mass index is 21.99 kg/m. Wt Readings from Last 3 Encounters:  01/08/22 140 lb 6.4 oz (63.7 kg)  12/27/21 142 lb 6.4 oz (64.6 kg)  12/24/21 141 lb 12.8 oz (64.3 kg)    Physical Exam Vitals reviewed.  Constitutional:      General: He is not in acute distress.    Appearance: He is not diaphoretic.  HENT:     Head: Normocephalic and atraumatic.     Nose: Nose normal.     Mouth/Throat:     Mouth: Mucous membranes are moist.     Pharynx: Oropharynx is clear.  Eyes:     Conjunctiva/sclera: Conjunctivae normal.     Pupils: Pupils are equal, round, and reactive to light.  Neck:      Thyroid: No thyromegaly.     Vascular: No JVD.     Trachea: No tracheal deviation.  Cardiovascular:     Rate and Rhythm: Normal rate and regular rhythm.     Heart sounds: No murmur heard. Pulmonary:     Effort: Pulmonary effort is normal. No respiratory distress.     Breath sounds: Rales (improved) present. No wheezing.     Comments: Slight increased work of breathing which is intermittent.  Abdominal:     General: Bowel sounds are normal. There is no distension.     Tenderness: There is no abdominal tenderness.     Comments: Slight firmness to abd. Hypoactive BS x 4  Musculoskeletal:     Right lower leg: No edema.     Left lower leg: No edema.  Lymphadenopathy:     Cervical: No cervical adenopathy.  Skin:    General: Skin is warm and dry.     Coloration: Skin is pale.  Neurological:     Mental Status: He is alert.     Comments: Left arm weakness due to prior CVA. Alert making hand signals with right hand.       Labs reviewed: Recent Labs    08/11/21 0408 08/12/21 0107 08/16/21 0000 12/13/21 1157 12/14/21 0000 12/20/21 0000 12/27/21 0000 01/03/22 1322  NA 140 142   < >  --    < > 142 144 144  K 3.6 3.9   < >  --    < > 3.6 3.7 3.7  CL 109 113*   < >  --    < > 103 104 108  CO2 24 23   < >  --    < >  26* 27* 25  GLUCOSE 120* 143*  --   --   --   --   --  120*  BUN 17 17   < >  --    < > 28* 27* 27*  CREATININE 0.81 0.83   < >  --    < > 0.9 0.9 1.18  CALCIUM 8.6* 8.7*   < >  --    < > 8.9 9.7 9.1  MG 1.9 2.0  --  2.4  --   --   --   --    < > = values in this interval not displayed.    Recent Labs    08/10/21 0937 08/11/21 0408 09/07/21 0000 09/08/21 0000 01/03/22 1322  AST '29 27 24 24 25  '$ ALT '26 23 18 18 20  '$ ALKPHOS 93 80 105 105 100  BILITOT 0.5 0.8  --   --  1.1  PROT 6.4* 5.8*  --   --  7.4  ALBUMIN 3.3* 3.1* 3.5 3.5 3.6    Recent Labs    07/12/21 1056 08/10/21 0937 08/10/21 0957 08/11/21 0408 08/12/21 0107 09/07/21 0000 11/01/21 0000  11/08/21 0000 12/06/21 0000  WBC 9.0 16.1*  --  9.1 10.2   < > 8.9 7.9 8.1  NEUTROABS 6,804 14.3*  --   --  7.8*  --   --   --   --   HGB 10.9* 10.0*   < > 9.4* 9.4*   < > 9.4* 9.9* 9.2*  HCT 33.9* 31.6*   < > 28.7* 29.8*   < > 28* 30* 28*  MCV 90.4 96.3  --  92.0 94.3  --   --   --   --   PLT 426* 238  --  218 227   < > 258 295 338   < > = values in this interval not displayed.    Lab Results  Component Value Date   TSH 1.773 01/24/2021   Lab Results  Component Value Date   HGBA1C 5.7 07/05/2021   Lab Results  Component Value Date   CHOL 110 07/05/2021   HDL 43 07/05/2021   LDLCALC 57 07/05/2021   TRIG 51 07/05/2021   CHOLHDL 2.8 08/08/2020    Significant Diagnostic Results in last 30 days:  ECHOCARDIOGRAM COMPLETE  Result Date: 01/03/2022    ECHOCARDIOGRAM REPORT   Patient Name:   [name] [name] Date of Exam: 01/03/2022 Medical Rec #:  [id number]     Height:       67.0 in Accession #:    [id number]    Weight:       142.4 lb Date of Birth:  Aug 03, 1940     BSA:          1.750 m Patient Age:    28 years      BP:           105/68 mmHg Patient Gender: M             HR:           85 bpm. Exam Location:  Outpatient Procedure: 2D Echo, Color Doppler and Cardiac Doppler Indications:    D32.67 Chronic systolic (congestive) heart failure  History:        Patient has prior history of Echocardiogram examinations, most                 recent 03/31/2021. Arrythmias:Atrial Fibrillation; Risk  Factors:Hypertension, Dyslipidemia and Sleep Apnea. 09/28/2004                 Cardiac Surgery with 71m Carpentier Edwards Bioprosthetic AVR,                 278mCarpentier Edwards Bioprosthetic MVR, LAA closure, and Maze                 procedure.  Sonographer:    EmRaquel Sarnaenior RDCS Referring Phys: [id number]: Technically difficult due to patient position, scanned reclined in wheelchair. IMPRESSIONS  1. Left ventricular ejection fraction, by estimation, is 55%. The  left ventricle has normal function. The left ventricle has no regional wall motion abnormalities. There is moderate concentric left ventricular hypertrophy. Left ventricular diastolic parameters are indeterminate.  2. Mildly D-shaped interventricular septum suggestive of RV pressure/volume overload. Right ventricular systolic function is moderately reduced. The right ventricular size is moderately enlarged. There is severely elevated pulmonary artery systolic pressure. The estimated right ventricular systolic pressure is 6835.5mHg.  3. Left atrial size was severely dilated.  4. Right atrial size was moderately dilated.  5. Bioprosthetic mitral valve. Mild mitral valve regurgitation. Thickened mitral valve with concern for vegetation. Mean gradient 20 mmHg with MVA 1.1 cm^2. Severe bioprosthetic mitral valve stenosis.  6. Bioprosthetic aortic valve. There is moderate thickening and restriction of the aortic valve. Aortic valve regurgitation is not visualized. Aortic valve area, by VTI measures 1.31 cm. Aortic valve mean gradient measures 39.0 mmHg. DI 0.26. 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 Cannot rule out vegatation.  7. Aortic dilatation noted. 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  8. The inferior vena cava is dilated in size with <50% respiratory variability, suggesting right atrial pressure of 15 mmHg. FINDINGS  Left Ventricle: Left ventricular ejection fraction, by estimation, is 55%. The left ventricle has normal function. The left ventricle has no regional wall motion abnormalities. The left ventricular internal cavity size was normal in size. There is moderate concentric left ventricular hypertrophy. Left ventricular diastolic parameters are indeterminate. Right Ventricle: Mildly D-shaped interventricular septum suggestive of RV pressure/volume overload. The right ventricular size is moderately enlarged. No increase in right ventricular wall thickness.  Right ventricular systolic function is moderately reduced. There is severely elevated pulmonary artery systolic pressure. The tricuspid regurgitant velocity is 3.66 m/s, and with an assumed right atrial pressure of 15 mmHg, the estimated right ventricular systolic pressure is 6897.4mHg. Left Atrium: Left atrial size was severely dilated. Right Atrium: Right atrial size was moderately dilated. Pericardium: There is no evidence of pericardial effusion. Mitral Valve: The mitral valve has been repaired/replaced. Mild mitral valve regurgitation. Severe mitral valve stenosis. MV peak gradient, 40.9 mmHg. The mean mitral valve gradient is 20.0 mmHg. Tricuspid Valve: The tricuspid valve is normal in structure. Tricuspid valve regurgitation is trivial. Aortic Valve: The aortic valve has been repaired/replaced. There is moderate thickening of the aortic valve. Aortic valve regurgitation is not visualized. Aortic valve mean gradient measures 39.0 mmHg. Aortic valve peak gradient measures 64.0 mmHg. Aortic valve area, by VTI measures 1.31 cm. Pulmonic Valve: The pulmonic valve was normal in structure. Pulmonic valve regurgitation is trivial. Aorta: Aortic dilatation noted. 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 Venous: The inferior vena cava is dilated in size with less than 50% respiratory variability, suggesting right atrial pressure of 15 mmHg. IAS/Shunts: No atrial level shunt detected by color flow  Doppler.  LEFT VENTRICLE PLAX 2D LVIDd:         3.50 cm   Diastology LVIDs:         2.30 cm   LV e' medial:    3.37 cm/s LV PW:         1.70 cm   LV E/e' medial:  87.2 LV IVS:        1.40 cm   LV e' lateral:   6.56 cm/s LVOT diam:     2.50 cm   LV E/e' lateral: 44.8 LV SV:         95 LV SV Index:   54 LVOT Area:     4.91 cm  RIGHT VENTRICLE RV S prime:     5.93 cm/s TAPSE (M-mode): 1.3 cm LEFT ATRIUM              Index        RIGHT ATRIUM           Index LA diam:        5.20 cm  2.97 cm/m   RA  Area:     25.30 cm LA Vol (A2C):   158.0 ml 90.26 ml/m  RA Volume:   70.00 ml  39.99 ml/m LA Vol (A4C):   122.0 ml 69.70 ml/m LA Biplane Vol: 146.0 ml 83.41 ml/m  AORTIC VALVE AV Area (Vmax):    1.26 cm AV Area (Vmean):   1.29 cm AV Area (VTI):     1.31 cm AV Vmax:           400.00 cm/s AV Vmean:          292.000 cm/s AV VTI:            0.722 m AV Peak Grad:      64.0 mmHg AV Mean Grad:      39.0 mmHg LVOT Vmax:         103.00 cm/s LVOT Vmean:        76.600 cm/s LVOT VTI:          0.193 m LVOT/AV VTI ratio: 0.27  AORTA Ao Root diam: 4.20 cm Ao Asc diam:  5.00 cm MITRAL VALVE                TRICUSPID VALVE MV Area (PHT): 0.87 cm     TR Peak grad:   53.6 mmHg MV Area VTI:   1.08 cm     TR Vmax:        366.00 cm/s MV Peak grad:  40.9 mmHg MV Mean grad:  20.0 mmHg    SHUNTS MV Vmax:       3.20 m/s     Systemic VTI:  0.19 m MV Vmean:      227.7 cm/s   Systemic Diam: 2.50 cm MV E velocity: 294.00 cm/s Dalton McleanMD Electronically signed by Franki Monte Signature Date/Time: 01/03/2022/12:37:20 PM    Final    CT Chest Wo Contrast  Result Date: 12/16/2021 CLINICAL DATA:  81 year old male with history of rheumatic fever with suspected aspiration. EXAM: CT CHEST WITHOUT CONTRAST TECHNIQUE: Multidetector CT imaging of the chest was performed following the standard protocol without IV contrast. RADIATION DOSE REDUCTION: This exam was performed according to the departmental dose-optimization program which includes automated exposure control, adjustment of the mA and/or kV according to patient size and/or use of iterative reconstruction technique. COMPARISON:  No priors. FINDINGS: Cardiovascular: Heart size is enlarged with left atrial dilatation. There is no significant pericardial fluid, thickening  or pericardial calcification. There is aortic atherosclerosis, as well as atherosclerosis of the great vessels of the mediastinum and the coronary arteries, including calcified atherosclerotic plaque in the left  main, left anterior descending, left circumflex and right coronary arteries. Severe calcifications of the mitral annulus. Status post median sternotomy for aortic and mitral valve replacement with what appear to be a stented bioprosthesis. Aneurysmal dilatation of the ascending thoracic aorta (5.1 cm in diameter). Dilatation of the pulmonic trunk (4.8 cm in diameter). Mediastinum/Nodes: Multiple prominent borderline enlarged lymph nodes are noted throughout the mediastinal and hilar nodal stations measuring up to 1.3 cm in short axis in the low right paratracheal nodal station. Esophagus is unremarkable in appearance. No axillary lymphadenopathy. Lungs/Pleura: Widespread areas of ground-glass attenuation and mild interlobular septal thickening are noted throughout the lungs bilaterally. No confluent consolidative airspace disease. Small right pleural effusion. No left pleural effusion. No definite suspicious appearing pulmonary nodules or masses are noted on today's examination which is limited by considerable patient respiratory motion. Upper Abdomen: Aortic atherosclerosis. Musculoskeletal: Median sternotomy wires. There are no aggressive appearing lytic or blastic lesions noted in the visualized portions of the skeleton. IMPRESSION: 1. The appearance of the chest is most suggestive of congestive heart failure. Specifically, there is cardiomegaly with what appears to be mild interstitial pulmonary edema and small right pleural effusion. 2. 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 3. Cardiomegaly with left atrial dilatation. 4. Aortic atherosclerosis, in addition to left main and three-vessel coronary artery disease. 5. Additional incidental findings, as above. Aortic Atherosclerosis (ICD10-I70.0). Electronically Signed   By: Vinnie Langton M.D.   On: 12/16/2021 06:42    Assessment/Plan  1. 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  Currently on Ritalin and amantadine. Has been followed by PMR  2. Spastic hemiparesis of left nondominant side ([place]) Working with OT On eliquis ,statin  3. Chronic hypoxic respiratory failure (HCC) Recommend 2 liters continuous. Order written to see if we can get this covered by insurance.   4. Controlled type 2 diabetes mellitus with complication, without long-term current use of insulin (HCC) Diet controlled  Needs A1C with next lab draw  5. Oropharyngeal dysphagia Reg NTL diet Asp prec 1:1 supervision Followed by ST  6. Sleep apnea treated with nocturnal BiPAP Uses bipap each night Followed by Dr Brett Fairy.   7. Atrial fibrillation, chronic (HCC) On tikosyn On Eliquis  8. Chronic systolic CHF (congestive heart failure) (HCC) On higher doses of torsemide. Will need to watch sodium but if at all possible he needs to continue with diuretics to help with feelings of CHF Heart healthy diet Monitor weight daily   9. Essential hypertension Controlled, on low side Norvasc reduced.   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   11. Benign prostatic hyperplasia with urinary obstruction No current issues Has prn cath order  12. Seizure (HCC) ON vimpat Diazepam prn  13. Hyperlipidemia with target LDL less than 130 Lab Results  Component Value Date   LDLCALC 57 07/05/2021   ON lipitor   14. Hypernatremia NA 148 10/30 Monitored weekly Encourage oral intake Avoid IVF due to CHF  15. Slow transit constipation Place on left side when distended Can try supp if needed and/or rectal tube  Continue miralax  16. 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  Cardiology indicated he is not a candidate for repair Hospice recommended.      Family/ staff Communication: Discussed hospice with his wife Herbert Pun and son Leveda Anna. They both feel they do not want to pursue this due to the fact that they are aware that hospice may discontinue or alter his current medication list. They also want him to continue to work with OT as he seems willing and the session help his abd distention. He is a DNR and they are avoiding hospitalizations. They express awareness for the severity of his valvular heart disease issues.  Labs/tests ordered:  BMP weekly

## 2022-01-10 DIAGNOSIS — I69391 Dysphagia following cerebral infarction: Secondary | ICD-10-CM | POA: Diagnosis not present

## 2022-01-10 DIAGNOSIS — R41840 Attention and concentration deficit: Secondary | ICD-10-CM | POA: Diagnosis not present

## 2022-01-10 DIAGNOSIS — I69354 Hemiplegia and hemiparesis following cerebral infarction affecting left non-dominant side: Secondary | ICD-10-CM | POA: Diagnosis not present

## 2022-01-10 DIAGNOSIS — R293 Abnormal posture: Secondary | ICD-10-CM | POA: Diagnosis not present

## 2022-01-10 DIAGNOSIS — I69812 Visuospatial deficit and spatial neglect following other cerebrovascular disease: Secondary | ICD-10-CM | POA: Diagnosis not present

## 2022-01-10 DIAGNOSIS — S065X3S Traumatic subdural hemorrhage with loss of consciousness of 1 hour to 5 hours 59 minutes, sequela: Secondary | ICD-10-CM | POA: Diagnosis not present

## 2022-01-10 DIAGNOSIS — I5022 Chronic systolic (congestive) heart failure: Secondary | ICD-10-CM | POA: Diagnosis not present

## 2022-01-10 DIAGNOSIS — Z23 Encounter for immunization: Secondary | ICD-10-CM | POA: Diagnosis not present

## 2022-01-10 DIAGNOSIS — H538 Other visual disturbances: Secondary | ICD-10-CM | POA: Diagnosis not present

## 2022-01-10 DIAGNOSIS — I38 Endocarditis, valve unspecified: Secondary | ICD-10-CM | POA: Diagnosis not present

## 2022-01-10 DIAGNOSIS — R41841 Cognitive communication deficit: Secondary | ICD-10-CM | POA: Diagnosis not present

## 2022-01-10 DIAGNOSIS — M6389 Disorders of muscle in diseases classified elsewhere, multiple sites: Secondary | ICD-10-CM | POA: Diagnosis not present

## 2022-01-10 DIAGNOSIS — G4089 Other seizures: Secondary | ICD-10-CM | POA: Diagnosis not present

## 2022-01-10 DIAGNOSIS — R278 Other lack of coordination: Secondary | ICD-10-CM | POA: Diagnosis not present

## 2022-01-10 LAB — BASIC METABOLIC PANEL
BUN: 29 — AB (ref 4–21)
CO2: 24 — AB (ref 13–22)
Chloride: 108 (ref 99–108)
Creatinine: 1.1 (ref 0.6–1.3)
Glucose: 96
Potassium: 3.6 mEq/L (ref 3.5–5.1)
Sodium: 4 — AB (ref 137–147)

## 2022-01-10 LAB — COMPREHENSIVE METABOLIC PANEL
Calcium: 9.5 (ref 8.7–10.7)
eGFR: 68

## 2022-01-11 ENCOUNTER — Non-Acute Institutional Stay (SKILLED_NURSING_FACILITY): Payer: Medicare Other | Admitting: Internal Medicine

## 2022-01-11 DIAGNOSIS — G4089 Other seizures: Secondary | ICD-10-CM | POA: Diagnosis not present

## 2022-01-11 DIAGNOSIS — S065X3S Traumatic subdural hemorrhage with loss of consciousness of 1 hour to 5 hours 59 minutes, sequela: Secondary | ICD-10-CM | POA: Diagnosis not present

## 2022-01-11 DIAGNOSIS — R569 Unspecified convulsions: Secondary | ICD-10-CM

## 2022-01-11 DIAGNOSIS — I5022 Chronic systolic (congestive) heart failure: Secondary | ICD-10-CM | POA: Diagnosis not present

## 2022-01-11 DIAGNOSIS — S069X3S Unspecified intracranial injury with loss of consciousness of 1 hour to 5 hours 59 minutes, sequela: Secondary | ICD-10-CM

## 2022-01-11 DIAGNOSIS — R293 Abnormal posture: Secondary | ICD-10-CM | POA: Diagnosis not present

## 2022-01-11 DIAGNOSIS — E870 Hyperosmolality and hypernatremia: Secondary | ICD-10-CM | POA: Diagnosis not present

## 2022-01-11 DIAGNOSIS — R278 Other lack of coordination: Secondary | ICD-10-CM | POA: Diagnosis not present

## 2022-01-11 DIAGNOSIS — R0602 Shortness of breath: Secondary | ICD-10-CM | POA: Diagnosis not present

## 2022-01-11 DIAGNOSIS — I69391 Dysphagia following cerebral infarction: Secondary | ICD-10-CM | POA: Diagnosis not present

## 2022-01-11 DIAGNOSIS — R41840 Attention and concentration deficit: Secondary | ICD-10-CM | POA: Diagnosis not present

## 2022-01-11 DIAGNOSIS — M6389 Disorders of muscle in diseases classified elsewhere, multiple sites: Secondary | ICD-10-CM | POA: Diagnosis not present

## 2022-01-11 DIAGNOSIS — H538 Other visual disturbances: Secondary | ICD-10-CM | POA: Diagnosis not present

## 2022-01-11 DIAGNOSIS — I69812 Visuospatial deficit and spatial neglect following other cerebrovascular disease: Secondary | ICD-10-CM | POA: Diagnosis not present

## 2022-01-11 DIAGNOSIS — R41841 Cognitive communication deficit: Secondary | ICD-10-CM | POA: Diagnosis not present

## 2022-01-11 DIAGNOSIS — I69354 Hemiplegia and hemiparesis following cerebral infarction affecting left non-dominant side: Secondary | ICD-10-CM | POA: Diagnosis not present

## 2022-01-11 NOTE — Progress Notes (Signed)
Location: Occupational psychologist of Service:  SNF (31)  Provider: Veleta Miners MD  Code Status: DNR Goals of Care:     12/27/2021   10:36 AM  Advanced Directives  Does Patient Have a Medical Advance Directive? Yes  Type of Paramedic of [place];Living will  Does patient want to make changes to medical advance directive? No - Patient declined  Copy of [place] in Chart? Yes - validated most recent copy scanned in chart (See row information)     Chief Complaint  Patient presents with   Acute Visit    HPI: Patient is a 81 y.o. 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   External hemorrhoids without mention of complication    H/O aortic valve replacement    H/O mitral valve replacement    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   Lesion of ulnar nerve    Multiple involvement of  mitral and aortic valves    Other and unspecified hyperlipidemia    Pre-diabetes    Previous back surgery 1978, jan 2007   Psychosexual dysfunction with inhibited sexual excitement    SOB (shortness of breath)    "with heavy exercise"   Stroke ([place]) 08/2013   "I WAS IN AFIB AND THREW A CLOT, THE EFFECTS WERE TRANSITORY AND DIDNT LAST BUT FOR 30 MINUTES"    Thoracic aortic aneurysm [facility])     Past Surgical History:  Procedure Laterality Date   AORTIC AND MITRAL VALVE REPLACEMENT     09/2004   CARDIOVERSION     3 times from 2004-2006   CARDIOVERSION N/A 09/26/2013   Procedure: CARDIOVERSION;  Surgeon: Larey Dresser, MD;  Location: [place];  Service: Cardiovascular;  Laterality: N/A;   CARDIOVERSION N/A 06/19/2014   Procedure: CARDIOVERSION;  Surgeon: Jerline Pain, MD;  Location: [facility] ENDOSCOPY;  Service: Cardiovascular;  Laterality: N/A;   CARDIOVERSION N/A 04/24/2017   Procedure: CARDIOVERSION;  Surgeon: Larey Dresser, MD;  Location: [facility] ENDOSCOPY;  Service: Cardiovascular;  Laterality: N/A;   CARDIOVERSION N/A 06/08/2017   Procedure: CARDIOVERSION;  Surgeon: Pixie Casino, MD;  Location: [facility] ENDOSCOPY;  Service: Cardiovascular;  Laterality: N/A;   CARDIOVERSION N/A 08/24/2017   Procedure: CARDIOVERSION;  Surgeon: Skeet Latch, MD;  Location: [place];  Service: Cardiovascular;  Laterality: N/A;   CARDIOVERSION N/A 02/14/2018   Procedure: CARDIOVERSION;  Surgeon: Larey Dresser, MD;  Location: [facility] ENDOSCOPY;  Service: Cardiovascular;  Laterality: N/A;   COLONOSCOPY     CRANIOTOMY N/A 07/28/2020   Procedure: CRANIOTOMY FOR EVACUATION OF SUBDURAL HEMATOMA;  Surgeon: Eustace Ast, MD;  Location: [place];  Service: Neurosurgery;  Laterality: N/A;   IR ANGIO VERTEBRAL SEL SUBCLAVIAN INNOMINATE UNI R MOD SED  08/07/2020   IR CT HEAD LTD  08/07/2020   IR PERCUTANEOUS ART THROMBECTOMY/INFUSION INTRACRANIAL INC DIAG ANGIO  08/07/2020   laminectomies     10/1975 and in 03/2005    RADIOLOGY WITH ANESTHESIA N/A 08/07/2020   Procedure: IR WITH ANESTHESIA;  Surgeon: Luanne Bras, MD;  Location: [place];  Service: Radiology;  Laterality: N/A;   TEE WITHOUT CARDIOVERSION N/A 12/18/2020   Procedure: TRANSESOPHAGEAL ECHOCARDIOGRAM (TEE);  Surgeon: Larey Dresser, MD;  Location: [facility] ENDOSCOPY;  Service: Cardiovascular;  Laterality: N/A;   [place] Right 04/03/2018   Procedure: TOTAL HIP ARTHROPLASTY ANTERIOR APPROACH;  Surgeon: Paralee Cancel, MD;  Location: [facility] ORS;  Service: Orthopedics;  Laterality: Right;  70 mins    Allergies  Allergen Reactions   Naproxen Hives   No [facility] Food Allergies Other (See Comments)    Scallops - distress, nausea and vomitting    Outpatient Encounter Medications as of 01/11/2022  Medication Sig   acetaminophen (TYLENOL) 325 MG tablet Take 650 mg by mouth as needed for mild pain.   Amantadine HCl 100 MG tablet Take 100 mg by mouth once.   amLODipine (NORVASC) 5 MG tablet Take 0.5 tablets (2.5 mg total) by mouth daily.   amoxicillin (AMOXIL) 500 MG capsule Take 500 mg by mouth 2 (two) times daily.   apixaban (ELIQUIS) 5 MG TABS tablet Take 1 tablet (5 mg total) by mouth 2 (two) times daily.   Artificial Saliva (BIOTENE MOISTURIZING MOUTH MT) Use as directed 2 sprays in the mouth or throat in the morning, at noon, and at bedtime.   atorvastatin (LIPITOR) 80 MG tablet Take 1 tablet (80 mg total) by mouth daily.   bisacodyl (DULCOLAX) 10 MG suppository Place 1 suppository (10 mg total) rectally daily as needed for moderate constipation or mild constipation.   clobetasol (TEMOVATE) 0.05 % external solution Apply 1 application  topically as needed (itching).   diazePAM, 15 MG Dose, (VALTOCO 15 MG DOSE) 2 x 7.5 MG/0.1ML LQPK Place 7.5 mg into each nostril (total of 15 mg) for grand mal seizure lasting over 5 minutes. Can repeat once in 4 hours. Do Not use more than twice in 24 hours   dofetilide (TIKOSYN)  125 MCG capsule Take 3 capsules (375 mcg total) by mouth 2 (two) times daily.   famotidine (PEPCID) 20 MG tablet Take 20 mg by mouth at bedtime as needed for indigestion.   finasteride (PROSCAR) 5 MG tablet Take 1 tablet (5 mg total) by mouth daily.   Glucerna (GLUCERNA) LIQD Take 237 mLs by mouth daily.   hydrocortisone 2.5 % cream Apply 1 application. topically daily as needed (itching).   lacosamide (VIMPAT) 10 MG/ML oral solution Take 5 mLs (50 mg total) by mouth in the morning.   lacosamide (VIMPAT) 10 MG/ML oral solution Take 7.5 mLs (75 mg total) by mouth at bedtime.   levalbuterol (XOPENEX) 0.63 MG/3ML nebulizer solution Take 0.63 mg by nebulization every 6 (six) hours as needed for wheezing or shortness of breath.  magnesium gluconate (MAGONATE) 500 MG tablet Take 1 tablet (500 mg total) by mouth daily.   melatonin 5 MG TABS Take 5 mg by mouth at bedtime.   methenamine (MANDELAMINE) 1 g tablet Take 1,000 mg by mouth 2 (two) times daily.   methylphenidate (RITALIN) 5 MG tablet Take 1 tablet (5 mg total) by mouth 2 (two) times daily. Take at 7 am and 12 pm   Multiple Vitamin (MULTIVITAMIN WITH MINERALS) TABS tablet Take 1 tablet by mouth daily.   omeprazole (PRILOSEC) 20 MG capsule Take 20 mg by mouth daily.   Polyethyl Glycol-Propyl Glycol (SYSTANE) 0.4-0.3 % SOLN Place 2 drops into both eyes daily.   polyethylene glycol (MIRALAX / GLYCOLAX) 17 g packet Take 17 g by mouth every other day.   potassium chloride (KLOR-CON) 10 MEQ tablet Take 2 tablets (20 mEq total) by mouth daily.   senna-docusate (SENOKOT-S) 8.6-50 MG tablet Take 1 tablet by mouth at bedtime as needed.   torsemide (DEMADEX) 20 MG tablet Take 2 tablets (40 mg total) by mouth in the morning AND 1 tablet (20 mg total) every evening.   No facility-administered encounter medications on file as of 01/11/2022.    Review of Systems:  Review of Systems  Unable to perform ROS: Dementia    Health Maintenance  Topic Date Due    Diabetic kidney evaluation - Urine ACR  Never done   Medicare Annual Wellness (AWV)  07/10/2020   COVID-19 Vaccine (4 - Moderna risk series) 03/09/2021   HEMOGLOBIN A1C  01/05/2022   OPHTHALMOLOGY EXAM  10/06/2022 (Originally 10/04/1950)   FOOT EXAM  10/23/2022   Diabetic kidney evaluation - GFR measurement  01/04/2023   TETANUS/TDAP  03/22/2028   Pneumonia Vaccine 24+ Years old  Completed   INFLUENZA VACCINE  Completed   Zoster Vaccines- Shingrix  Completed   HPV VACCINES  Aged Out   COLONOSCOPY (Pts 45-14yr Insurance coverage will need to be confirmed)  Discontinued   Hepatitis C Screening  Discontinued    Physical Exam: There were no vitals filed for this visit. There is no height or weight on file to calculate BMI. Physical Exam Vitals reviewed.  Constitutional:      Comments: Very frail  HENT:     Head: Normocephalic.     Nose: Nose normal.     Mouth/Throat:     Mouth: Mucous membranes are moist.     Pharynx: Oropharynx is clear.  Eyes:     Pupils: Pupils are equal, round, and reactive to light.  Cardiovascular:     Rate and Rhythm: Normal rate and regular rhythm.     Pulses: Normal pulses.     Heart sounds: Murmur heard.  Pulmonary:     Effort: Pulmonary effort is normal. No respiratory distress.     Breath sounds: Normal breath sounds. No rales.  Abdominal:     General: Abdomen is flat. Bowel sounds are normal.     Palpations: Abdomen is soft.  Musculoskeletal:        General: No swelling.     Cervical back: Neck supple.  Skin:    General: Skin is warm.  Neurological:     Mental Status: He is alert.     Comments: Would hold my hands but does not follow any commands  Psychiatric:        Mood and Affect: Mood normal.        Thought Content: Thought content normal.     Labs reviewed: Basic Metabolic Panel: Recent Labs  01/24/21 0157 01/25/21 0623 08/11/21 0408 08/12/21 0107 08/16/21 0000 12/13/21 1157 12/14/21 0000 12/20/21 0000  12/27/21 0000 01/03/22 1322  NA  --    < > 140 142   < >  --    < > 142 144 144  K  --    < > 3.6 3.9   < >  --    < > 3.6 3.7 3.7  CL  --    < > 109 113*   < >  --    < > 103 104 108  CO2  --    < > 24 23   < >  --    < > 26* 27* 25  GLUCOSE  --    < > 120* 143*  --   --   --   --   --  120*  BUN  --    < > 17 17   < >  --    < > 28* 27* 27*  CREATININE  --    < > 0.81 0.83   < >  --    < > 0.9 0.9 1.18  CALCIUM  --    < > 8.6* 8.7*   < >  --    < > 8.9 9.7 9.1  MG 1.9   < > 1.9 2.0  --  2.4  --   --   --   --   TSH 1.773  --   --   --   --   --   --   --   --   --    < > = values in this interval not displayed.   Liver Function Tests: Recent Labs    08/10/21 0937 08/11/21 0408 09/07/21 0000 09/08/21 0000 01/03/22 1322  AST '29 27 24 24 25  '$ ALT '26 23 18 18 20  '$ ALKPHOS 93 80 105 105 100  BILITOT 0.5 0.8  --   --  1.1  PROT 6.4* 5.8*  --   --  7.4  ALBUMIN 3.3* 3.1* 3.5 3.5 3.6   No results for input(s): "LIPASE", "AMYLASE" in the last 8760 hours. Recent Labs    01/24/21 0157  AMMONIA 22   CBC: Recent Labs    07/12/21 1056 08/10/21 0937 08/10/21 0957 08/11/21 0408 08/12/21 0107 09/07/21 0000 11/01/21 0000 11/08/21 0000 12/06/21 0000  WBC 9.0 16.1*  --  9.1 10.2   < > 8.9 7.9 8.1  NEUTROABS 6,804 14.3*  --   --  7.8*  --   --   --   --   HGB 10.9* 10.0*   < > 9.4* 9.4*   < > 9.4* 9.9* 9.2*  HCT 33.9* 31.6*   < > 28.7* 29.8*   < > 28* 30* 28*  MCV 90.4 96.3  --  92.0 94.3  --   --   --   --   PLT 426* 238  --  218 227   < > 258 295 338   < > = values in this interval not displayed.   Lipid Panel: Recent Labs    07/05/21 0000  CHOL 110  HDL 43  LDLCALC 57  TRIG 51   Lab Results  Component Value Date   HGBA1C 5.7 07/05/2021    Procedures since last visit: ECHOCARDIOGRAM COMPLETE  Result Date: 01/03/2022    ECHOCARDIOGRAM REPORT   Patient Name:   [name] [name] Date of Exam: 01/03/2022 Medical Rec #:  [id number]     Height:       67.0 in Accession #:     [id number]    Weight:       142.4 lb Date of Birth:  1940-11-01     BSA:          1.750 m Patient Age:    69 years      BP:           105/68 mmHg Patient Gender: M             HR:           85 bpm. Exam Location:  Outpatient Procedure: 2D Echo, Color Doppler and Cardiac Doppler Indications:    M84.13 Chronic systolic (congestive) heart failure  History:        Patient has prior history of Echocardiogram examinations, most                 recent 03/31/2021. Arrythmias:Atrial Fibrillation; Risk                 Factors:Hypertension, Dyslipidemia and Sleep Apnea. 09/28/2004                 Cardiac Surgery with 34m Carpentier Edwards Bioprosthetic AVR,                 236mCarpentier Edwards Bioprosthetic MVR, LAA closure, and Maze                 procedure.  Sonographer:    EmRaquel Sarnaenior RDCS Referring Phys: 37Shelbyomments: Technically difficult due to patient position, scanned reclined in wheelchair. IMPRESSIONS  1. Left ventricular ejection fraction, by estimation, is 55%. The left ventricle has normal function. The left ventricle has no regional wall motion abnormalities. There is moderate concentric left ventricular hypertrophy. Left ventricular diastolic parameters are indeterminate.  2. Mildly D-shaped interventricular septum suggestive of RV pressure/volume overload. Right ventricular systolic function is moderately reduced. The right ventricular size is moderately enlarged. There is severely elevated pulmonary artery systolic pressure. The estimated right ventricular systolic pressure is 6824.4mHg.  3. Left atrial size was severely dilated.  4. Right atrial size was moderately dilated.  5. Bioprosthetic mitral valve. Mild mitral valve regurgitation. Thickened mitral valve with concern for vegetation. Mean gradient 20 mmHg with MVA 1.1 cm^2. Severe bioprosthetic mitral valve stenosis.  6. Bioprosthetic aortic valve. There is moderate thickening and restriction of the aortic valve.  Aortic valve regurgitation is not visualized. Aortic valve area, by VTI measures 1.31 cm. Aortic valve mean gradient measures 39.0 mmHg. DI 0.26. 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 Cannot rule out vegatation.  7. Aortic dilatation noted. 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  8. The inferior vena cava is dilated in size with <50% respiratory variability, suggesting right atrial pressure of 15 mmHg. FINDINGS  Left Ventricle: Left ventricular ejection fraction, by estimation, is 55%. The left ventricle has normal function. The left ventricle has no regional wall motion abnormalities. The left ventricular internal cavity size was normal in size. There is moderate concentric left ventricular hypertrophy. Left ventricular diastolic parameters are indeterminate. Right Ventricle: Mildly D-shaped interventricular septum suggestive of RV pressure/volume overload. The right ventricular size is moderately enlarged. No increase in right ventricular wall thickness. Right ventricular systolic function is moderately reduced. There is severely elevated pulmonary artery systolic pressure. The tricuspid regurgitant velocity is 3.66 m/s, and with an assumed right atrial pressure of  15 mmHg, the estimated right ventricular systolic pressure is 72.6 mmHg. Left Atrium: Left atrial size was severely dilated. Right Atrium: Right atrial size was moderately dilated. Pericardium: There is no evidence of pericardial effusion. Mitral Valve: The mitral valve has been repaired/replaced. Mild mitral valve regurgitation. Severe mitral valve stenosis. MV peak gradient, 40.9 mmHg. The mean mitral valve gradient is 20.0 mmHg. Tricuspid Valve: The tricuspid valve is normal in structure. Tricuspid valve regurgitation is trivial. Aortic Valve: The aortic valve has been repaired/replaced. There is moderate thickening of the aortic valve. Aortic valve regurgitation is not visualized. Aortic valve mean  gradient measures 39.0 mmHg. Aortic valve peak gradient measures 64.0 mmHg. Aortic valve area, by VTI measures 1.31 cm. Pulmonic Valve: The pulmonic valve was normal in structure. Pulmonic valve regurgitation is trivial. Aorta: Aortic dilatation noted. 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 Venous: The inferior vena cava is dilated in size with less than 50% respiratory variability, suggesting right atrial pressure of 15 mmHg. IAS/Shunts: No atrial level shunt detected by color flow Doppler.  LEFT VENTRICLE PLAX 2D LVIDd:         3.50 cm   Diastology LVIDs:         2.30 cm   LV e' medial:    3.37 cm/s LV PW:         1.70 cm   LV E/e' medial:  87.2 LV IVS:        1.40 cm   LV e' lateral:   6.56 cm/s LVOT diam:     2.50 cm   LV E/e' lateral: 44.8 LV SV:         95 LV SV Index:   54 LVOT Area:     4.91 cm  RIGHT VENTRICLE RV S prime:     5.93 cm/s TAPSE (M-mode): 1.3 cm LEFT ATRIUM              Index        RIGHT ATRIUM           Index LA diam:        5.20 cm  2.97 cm/m   RA Area:     25.30 cm LA Vol (A2C):   158.0 ml 90.26 ml/m  RA Volume:   70.00 ml  39.99 ml/m LA Vol (A4C):   122.0 ml 69.70 ml/m LA Biplane Vol: 146.0 ml 83.41 ml/m  AORTIC VALVE AV Area (Vmax):    1.26 cm AV Area (Vmean):   1.29 cm AV Area (VTI):     1.31 cm AV Vmax:           400.00 cm/s AV Vmean:          292.000 cm/s AV VTI:            0.722 m AV Peak Grad:      64.0 mmHg AV Mean Grad:      39.0 mmHg LVOT Vmax:         103.00 cm/s LVOT Vmean:        76.600 cm/s LVOT VTI:          0.193 m LVOT/AV VTI ratio: 0.27  AORTA Ao Root diam: 4.20 cm Ao Asc diam:  5.00 cm MITRAL VALVE                TRICUSPID VALVE MV Area (PHT): 0.87 cm     TR Peak grad:   53.6 mmHg MV Area VTI:   1.08 cm  TR Vmax:        366.00 cm/s MV Peak grad:  40.9 mmHg MV Mean grad:  20.0 mmHg    SHUNTS MV Vmax:       3.20 m/s     Systemic VTI:  0.19 m MV Vmean:      227.7 cm/s   Systemic Diam: 2.50 cm MV E velocity: 294.00 cm/s Dalton  McleanMD Electronically signed by Franki Monte Signature Date/Time: 01/03/2022/12:37:20 PM    Final    CT Chest Wo Contrast  Result Date: 12/16/2021 CLINICAL DATA:  81 year old male with history of rheumatic fever with suspected aspiration. EXAM: CT CHEST WITHOUT CONTRAST TECHNIQUE: Multidetector CT imaging of the chest was performed following the standard protocol without IV contrast. RADIATION DOSE REDUCTION: This exam was performed according to the departmental dose-optimization program which includes automated exposure control, adjustment of the mA and/or kV according to patient size and/or use of iterative reconstruction technique. COMPARISON:  No priors. FINDINGS: Cardiovascular: Heart size is enlarged with left atrial dilatation. There is no significant pericardial fluid, thickening or pericardial calcification. There is aortic atherosclerosis, as well as atherosclerosis of the great vessels of the mediastinum and the coronary arteries, including calcified atherosclerotic plaque in the left main, left anterior descending, left circumflex and right coronary arteries. Severe calcifications of the mitral annulus. Status post median sternotomy for aortic and mitral valve replacement with what appear to be a stented bioprosthesis. Aneurysmal dilatation of the ascending thoracic aorta (5.1 cm in diameter). Dilatation of the pulmonic trunk (4.8 cm in diameter). Mediastinum/Nodes: Multiple prominent borderline enlarged lymph nodes are noted throughout the mediastinal and hilar nodal stations measuring up to 1.3 cm in short axis in the low right paratracheal nodal station. Esophagus is unremarkable in appearance. No axillary lymphadenopathy. Lungs/Pleura: Widespread areas of ground-glass attenuation and mild interlobular septal thickening are noted throughout the lungs bilaterally. No confluent consolidative airspace disease. Small right pleural effusion. No left pleural effusion. No definite suspicious  appearing pulmonary nodules or masses are noted on today's examination which is limited by considerable patient respiratory motion. Upper Abdomen: Aortic atherosclerosis. Musculoskeletal: Median sternotomy wires. There are no aggressive appearing lytic or blastic lesions noted in the visualized portions of the skeleton. IMPRESSION: 1. The appearance of the chest is most suggestive of congestive heart failure. Specifically, there is cardiomegaly with what appears to be mild interstitial pulmonary edema and small right pleural effusion. 2. 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 3. Cardiomegaly with left atrial dilatation. 4. Aortic atherosclerosis, in addition to left main and three-vessel coronary artery disease. 5. Additional incidental findings, as above. Aortic Atherosclerosis (ICD10-I70.0). Electronically Signed   By: Vinnie Langton M.D.   On: 12/16/2021 06:42    Assessment/Plan 1. 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 Seizure ([place]) No Change in Vimpat But have to consider increasing if more episodes  3. Hypernatremia Sodium staying at 148  4. 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

## 2022-01-12 ENCOUNTER — Encounter: Payer: Self-pay | Admitting: Internal Medicine

## 2022-01-12 DIAGNOSIS — I5022 Chronic systolic (congestive) heart failure: Secondary | ICD-10-CM | POA: Diagnosis not present

## 2022-01-12 DIAGNOSIS — G4089 Other seizures: Secondary | ICD-10-CM | POA: Diagnosis not present

## 2022-01-12 DIAGNOSIS — R41841 Cognitive communication deficit: Secondary | ICD-10-CM | POA: Diagnosis not present

## 2022-01-12 DIAGNOSIS — I69812 Visuospatial deficit and spatial neglect following other cerebrovascular disease: Secondary | ICD-10-CM | POA: Diagnosis not present

## 2022-01-12 DIAGNOSIS — R293 Abnormal posture: Secondary | ICD-10-CM | POA: Diagnosis not present

## 2022-01-12 DIAGNOSIS — M6389 Disorders of muscle in diseases classified elsewhere, multiple sites: Secondary | ICD-10-CM | POA: Diagnosis not present

## 2022-01-12 DIAGNOSIS — H538 Other visual disturbances: Secondary | ICD-10-CM | POA: Diagnosis not present

## 2022-01-12 DIAGNOSIS — I69391 Dysphagia following cerebral infarction: Secondary | ICD-10-CM | POA: Diagnosis not present

## 2022-01-12 DIAGNOSIS — R41840 Attention and concentration deficit: Secondary | ICD-10-CM | POA: Diagnosis not present

## 2022-01-12 DIAGNOSIS — S065X3S Traumatic subdural hemorrhage with loss of consciousness of 1 hour to 5 hours 59 minutes, sequela: Secondary | ICD-10-CM | POA: Diagnosis not present

## 2022-01-12 DIAGNOSIS — R278 Other lack of coordination: Secondary | ICD-10-CM | POA: Diagnosis not present

## 2022-01-12 DIAGNOSIS — I69354 Hemiplegia and hemiparesis following cerebral infarction affecting left non-dominant side: Secondary | ICD-10-CM | POA: Diagnosis not present

## 2022-01-13 ENCOUNTER — Telehealth: Payer: Self-pay | Admitting: Neurology

## 2022-01-13 ENCOUNTER — Non-Acute Institutional Stay (SKILLED_NURSING_FACILITY): Payer: Medicare Other | Admitting: Adult Health

## 2022-01-13 ENCOUNTER — Encounter: Payer: Self-pay | Admitting: Adult Health

## 2022-01-13 DIAGNOSIS — H538 Other visual disturbances: Secondary | ICD-10-CM | POA: Diagnosis not present

## 2022-01-13 DIAGNOSIS — I69398 Other sequelae of cerebral infarction: Secondary | ICD-10-CM

## 2022-01-13 DIAGNOSIS — R063 Periodic breathing: Secondary | ICD-10-CM | POA: Diagnosis not present

## 2022-01-13 DIAGNOSIS — G4737 Central sleep apnea in conditions classified elsewhere: Secondary | ICD-10-CM

## 2022-01-13 DIAGNOSIS — I38 Endocarditis, valve unspecified: Secondary | ICD-10-CM | POA: Diagnosis not present

## 2022-01-13 DIAGNOSIS — G4730 Sleep apnea, unspecified: Secondary | ICD-10-CM

## 2022-01-13 DIAGNOSIS — M6389 Disorders of muscle in diseases classified elsewhere, multiple sites: Secondary | ICD-10-CM | POA: Diagnosis not present

## 2022-01-13 DIAGNOSIS — I69391 Dysphagia following cerebral infarction: Secondary | ICD-10-CM | POA: Diagnosis not present

## 2022-01-13 DIAGNOSIS — R0681 Apnea, not elsewhere classified: Secondary | ICD-10-CM

## 2022-01-13 DIAGNOSIS — I5022 Chronic systolic (congestive) heart failure: Secondary | ICD-10-CM | POA: Diagnosis not present

## 2022-01-13 DIAGNOSIS — R293 Abnormal posture: Secondary | ICD-10-CM | POA: Diagnosis not present

## 2022-01-13 DIAGNOSIS — R41841 Cognitive communication deficit: Secondary | ICD-10-CM | POA: Diagnosis not present

## 2022-01-13 DIAGNOSIS — I69812 Visuospatial deficit and spatial neglect following other cerebrovascular disease: Secondary | ICD-10-CM | POA: Diagnosis not present

## 2022-01-13 DIAGNOSIS — I69354 Hemiplegia and hemiparesis following cerebral infarction affecting left non-dominant side: Secondary | ICD-10-CM

## 2022-01-13 DIAGNOSIS — R278 Other lack of coordination: Secondary | ICD-10-CM | POA: Diagnosis not present

## 2022-01-13 DIAGNOSIS — S065X3S Traumatic subdural hemorrhage with loss of consciousness of 1 hour to 5 hours 59 minutes, sequela: Secondary | ICD-10-CM | POA: Diagnosis not present

## 2022-01-13 DIAGNOSIS — R41840 Attention and concentration deficit: Secondary | ICD-10-CM | POA: Diagnosis not present

## 2022-01-13 DIAGNOSIS — G4089 Other seizures: Secondary | ICD-10-CM | POA: Diagnosis not present

## 2022-01-13 LAB — BASIC METABOLIC PANEL
BUN: 37 — AB (ref 4–21)
CO2: 27 — AB (ref 13–22)
Chloride: 106 (ref 99–108)
Creatinine: 1.2 (ref 0.6–1.3)
Glucose: 106
Potassium: 3.6 mEq/L (ref 3.5–5.1)
Sodium: 147 (ref 137–147)

## 2022-01-13 LAB — COMPREHENSIVE METABOLIC PANEL: Calcium: 9.6 (ref 8.7–10.7)

## 2022-01-13 NOTE — Telephone Encounter (Signed)
Please advise patient's caretaker or NP to notify medical doctor on call or call 911.

## 2022-01-13 NOTE — Telephone Encounter (Addendum)
Called and spoke w/ wife. Nursing home is utilizing chin strip w/ BIPAP. She found out he is sticking finger up mask and breaking mask seal, pulling mask off/pulls tube out from mask.  Advised I reviewed report and it shows worsening AHI and central apneas compared to last month. Showing many leaks still. She is agreeable to have [place] send order to [facility] to do mask refit to ensure things look ok from that standpoint. Encouraged her to continue to encourage him to use BIPAP, leave mask/tubing alone. Pt unable to do titration study. I faxed order to [facility] at [phone].  She also states he had a seizure 01/11/22 around 4pm after getting covid vaccine that morning. Seizure lasted less than 5 min (about 3-4 min). They did not administer any rescue med. Reserves nasal spray for seizure lasting >5 min. She was told by RN at facility to ask about IM ativan to see if this would be an option for these type of seizure events. Aware Dr. Brett Fairy out and question will be sent to covering MD, Dr. Rexene Alberts to review.

## 2022-01-13 NOTE — Telephone Encounter (Signed)
Called wife back. Relayed Dr. Guadelupe Sabin note. She states he has DNR and NP is affiliated with medical doctor. They are aware of situation and will continue to monitor. They will call back if any other questions arise.

## 2022-01-13 NOTE — Telephone Encounter (Signed)
Called back and spoke w/ wife. Relayed Dr. Guadelupe Sabin message. She verbalized understanding.  Nurse practitioner came into room to evaluate this morning and noticed [name] stokes breathing while awake. Did tell her this could be sign of end of life.  Vital signs checked at 10:30am. BP 157/69 HR: 83 Respirations: 22 initially and then had periods of apnea up to 33 seconds (while half awake) O2 on 2L: 97%  He goes to lunch around 12:30pm and is eating okay. Asked them to monitor him closely. If he has any new or worsening sx, they should call. I will send to Dr. Rexene Alberts for review and will call back only if she has any other suggestions. She verbalized understanding.

## 2022-01-13 NOTE — Progress Notes (Signed)
Location:  Occupational psychologist of Service:  SNF (31) Provider:   Cindi Carbon, [place] [phone]   [name], MD  Patient Care Team: [name], MD as PCP - General (Internal Medicine) Larey Dresser, MD as PCP - Cardiology (Cardiology) [name], MD as Consulting Physician (Ophthalmology) [name], MD (Internal Medicine)  Extended Emergency Contact Information Primary Emergency Contact: [name] Address: [address]          [place], [place] [address] [place] Phone: [phone] Mobile Phone: [phone] Relation: Spouse Secondary Emergency Contact: [name], [place] [address] [name] of [place] Phone: [phone] Relation: Son  Code Status:  DNR Goals of care: Advanced Directive information    12/27/2021   10:36 AM  Advanced Directives  Does Patient Have a Medical Advance Directive? Yes  Type of Paramedic of [place];Living will  Does patient want to make changes to medical advance directive? No - Patient declined  Copy of [place] in Chart? Yes - validated most recent copy scanned in chart (See row information)     Chief Complaint  Patient presents with   Acute Visit    Apnea worsening     HPI:  Pt is a 81 y.o. male seen today for an acute visit for seizure.   PMH includes syncope with fall in May of 2022, TBI with SDH with craniotomy, acute right MCA s/p thrombectomy, bioprosthetic mitral and aortic valve replacement, DM II, diastiolic CHF, BPH, and afib.  On life long amoxicillin for endocarditis   Nurse called to have me come see MR. Colarusso due to apnea. He has chronic sleep apnea but the staff have noticed it is getting worse. Present when awake. He is having 30 sec periods of apnea and then followed by increased resp rate. He is also looking to the right more and neglecting the  left side more. He has had several seizures in the past 6 weeks. Also has worsening heart failure and valvular heart disease. He was recently seen by cardiology and hospice was recommended. Family declined due to concerns for med coverage.  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    Other and unspecified hyperlipidemia    Pre-diabetes    Previous back surgery 1978, jan 2007   Psychosexual dysfunction with inhibited sexual excitement    SOB (shortness of breath)    "with heavy exercise"   Stroke ([place]) 08/2013   "I WAS IN AFIB AND THREW A CLOT, THE EFFECTS WERE TRANSITORY AND DIDNT LAST BUT FOR 30 MINUTES"    Thoracic aortic aneurysm [facility])    Past Surgical History:  Procedure Laterality Date   AORTIC AND MITRAL VALVE REPLACEMENT     09/2004   CARDIOVERSION  3 times from 2004-2006   CARDIOVERSION N/A 09/26/2013   Procedure: CARDIOVERSION;  Surgeon: Larey Dresser, MD;  Location: [facility];  Service: Cardiovascular;  Laterality: N/A;   CARDIOVERSION N/A 06/19/2014   Procedure: CARDIOVERSION;  Surgeon: Jerline Pain, MD;  Location: [facility];  Service: Cardiovascular;  Laterality: N/A;   CARDIOVERSION N/A 04/24/2017   Procedure: CARDIOVERSION;  Surgeon: Larey Dresser, MD;  Location: [facility] ENDOSCOPY;  Service: Cardiovascular;  Laterality: N/A;   CARDIOVERSION N/A 06/08/2017   Procedure: CARDIOVERSION;  Surgeon: Pixie Casino, MD;  Location: [facility] ENDOSCOPY;  Service: Cardiovascular;  Laterality: N/A;    CARDIOVERSION N/A 08/24/2017   Procedure: CARDIOVERSION;  Surgeon: Skeet Latch, MD;  Location: [facility] ENDOSCOPY;  Service: Cardiovascular;  Laterality: N/A;   CARDIOVERSION N/A 02/14/2018   Procedure: CARDIOVERSION;  Surgeon: Larey Dresser, MD;  Location: [facility] ENDOSCOPY;  Service: Cardiovascular;  Laterality: N/A;   COLONOSCOPY     CRANIOTOMY N/A 07/28/2020   Procedure: CRANIOTOMY FOR EVACUATION OF SUBDURAL HEMATOMA;  Surgeon: Eustace Kines, MD;  Location: [facility] [facility];  Service: Neurosurgery;  Laterality: N/A;   IR ANGIO VERTEBRAL SEL SUBCLAVIAN INNOMINATE UNI R MOD SED  08/07/2020   IR CT HEAD LTD  08/07/2020   IR PERCUTANEOUS ART THROMBECTOMY/INFUSION INTRACRANIAL INC DIAG ANGIO  08/07/2020   laminectomies     10/1975 and in 03/2005   RADIOLOGY WITH ANESTHESIA N/A 08/07/2020   Procedure: IR WITH ANESTHESIA;  Surgeon: Luanne Bras, MD;  Location: [facility];  Service: Radiology;  Laterality: N/A;   TEE WITHOUT CARDIOVERSION N/A 12/18/2020   Procedure: TRANSESOPHAGEAL ECHOCARDIOGRAM (TEE);  Surgeon: Larey Dresser, MD;  Location: [facility] ENDOSCOPY;  Service: Cardiovascular;  Laterality: N/A;   [place] Right 04/03/2018   Procedure: TOTAL HIP ARTHROPLASTY ANTERIOR APPROACH;  Surgeon: Paralee Cancel, MD;  Location: [facility] ORS;  Service: Orthopedics;  Laterality: Right;  70 mins    Allergies  Allergen Reactions   Naproxen Hives   No [facility] Food Allergies Other (See Comments)    Scallops - distress, nausea and vomitting    Outpatient Encounter Medications as of 01/13/2022  Medication Sig   acetaminophen (TYLENOL) 325 MG tablet Take 650 mg by mouth as needed for mild pain.   Amantadine HCl 100 MG tablet Take 100 mg by mouth once.   amLODipine (NORVASC) 5 MG tablet Take 0.5 tablets (2.5 mg total) by mouth daily.   amoxicillin (AMOXIL) 500 MG capsule Take 500 mg by mouth 2 (two) times daily.   apixaban (ELIQUIS) 5 MG TABS tablet Take 1 tablet (5 mg total) by mouth 2  (two) times daily.   Artificial Saliva (BIOTENE MOISTURIZING MOUTH MT) Use as directed 2 sprays in the mouth or throat in the morning, at noon, and at bedtime.   atorvastatin (LIPITOR) 80 MG tablet Take 1 tablet (80 mg total) by mouth daily.   bisacodyl (DULCOLAX) 10 MG suppository Place 1 suppository (10 mg total) rectally daily as needed for moderate constipation or mild constipation.   clobetasol (TEMOVATE) 0.05 % external solution Apply 1 application  topically as needed (itching).   diazePAM, 15 MG Dose, (VALTOCO 15 MG DOSE) 2 x 7.5 MG/0.1ML LQPK Place 7.5 mg into each nostril (total of 15 mg) for grand mal seizure lasting over 5 minutes. Can repeat once in 4 hours. Do Not use more than twice in 24 hours   dofetilide (TIKOSYN) 125 MCG capsule Take 3 capsules (375 mcg total) by mouth  2 (two) times daily.   famotidine (PEPCID) 20 MG tablet Take 20 mg by mouth at bedtime as needed for indigestion.   finasteride (PROSCAR) 5 MG tablet Take 1 tablet (5 mg total) by mouth daily.   Glucerna (GLUCERNA) LIQD Take 237 mLs by mouth daily.   hydrocortisone 2.5 % cream Apply 1 application. topically daily as needed (itching).   lacosamide (VIMPAT) 10 MG/ML oral solution Take 5 mLs (50 mg total) by mouth in the morning.   lacosamide (VIMPAT) 10 MG/ML oral solution Take 7.5 mLs (75 mg total) by mouth at bedtime.   levalbuterol (XOPENEX) 0.63 MG/3ML nebulizer solution Take 0.63 mg by nebulization every 6 (six) hours as needed for wheezing or shortness of breath.   magnesium gluconate (MAGONATE) 500 MG tablet Take 1 tablet (500 mg total) by mouth daily.   melatonin 5 MG TABS Take 5 mg by mouth at bedtime.   methenamine (MANDELAMINE) 1 g tablet Take 1,000 mg by mouth 2 (two) times daily.   methylphenidate (RITALIN) 5 MG tablet Take 1 tablet (5 mg total) by mouth 2 (two) times daily. Take at 7 am and 12 pm   Multiple Vitamin (MULTIVITAMIN WITH MINERALS) TABS tablet Take 1 tablet by mouth daily.   omeprazole  (PRILOSEC) 20 MG capsule Take 20 mg by mouth daily.   Polyethyl Glycol-Propyl Glycol (SYSTANE) 0.4-0.3 % SOLN Place 2 drops into both eyes daily.   polyethylene glycol (MIRALAX / GLYCOLAX) 17 g packet Take 17 g by mouth every other day.   potassium chloride (KLOR-CON) 10 MEQ tablet Take 2 tablets (20 mEq total) by mouth daily.   senna-docusate (SENOKOT-S) 8.6-50 MG tablet Take 1 tablet by mouth at bedtime as needed.   torsemide (DEMADEX) 20 MG tablet Take 2 tablets (40 mg total) by mouth in the morning AND 1 tablet (20 mg total) every evening. (Patient taking differently: Take 2 tablets (40 mg total) by mouth in the morning )   torsemide (DEMADEX) 20 MG tablet Take 20 mg by mouth 3 (three) times a week.   No facility-administered encounter medications on file as of 01/13/2022.    Review of Systems  Unable to perform ROS: Mental status change    Immunization History  Administered Date(s) Administered   Fluad Quad(high Dose 65+) 12/06/2018, 12/09/2020, 12/10/2021   Influenza Whole 12/25/2008, 01/05/2010, 12/30/2011   Influenza, High Dose Seasonal PF 12/14/2016   Influenza,inj,Quad PF,6+ Mos 12/19/2013   Influenza,inj,quad, With Preservative 11/22/2017   Influenza-Unspecified 12/14/2015, 12/24/2019, 12/10/2021   Moderna Covid-19 Vaccine Bivalent Booster 50yr & up 01/12/2021   Moderna SARS-COV2 Booster Vaccination 01/02/2020, 06/26/2020   Moderna Sars-Covid-2 Vaccination 04/08/2019, 05/05/2019   Pneumococcal Conjugate-13 12/19/2013   Pneumococcal Polysaccharide-23 09/24/2007, 05/22/2017   Td 12/25/2008   Td (Adult), 2 Lf Tetanus Toxid, Preservative Free 12/25/2008   Tdap 03/22/2018   Zoster Recombinat (Shingrix) 07/16/2019, 10/28/2019   Zoster, Live 09/24/2007   Pertinent  Health Maintenance Due  Topic Date Due   HEMOGLOBIN A1C  01/05/2022   OPHTHALMOLOGY EXAM  10/06/2022 (Originally 10/04/1950)   FOOT EXAM  10/23/2022   INFLUENZA VACCINE  Completed   COLONOSCOPY (Pts 45-417yr Insurance coverage will need to be confirmed)  Discontinued      08/10/2021   11:10 PM 08/11/2021    8:00 AM 08/11/2021    7:30 PM 08/12/2021    8:00 AM 10/13/2021    2:32 PM  [place] the past year?     0  Patient Fall Risk Level  High fall risk High fall risk High fall risk High fall risk    Functional Status Survey:    Vitals:   01/13/22 1630  BP: (!) 157/69  Pulse: 83  Resp: (!) 24  Temp: (!) 97 F (36.1 C)  SpO2: 97%  Weight: 136 lb 12.8 oz (62.1 kg)   Body mass index is 21.43 kg/m. Wt Readings from Last 3 Encounters:  01/13/22 136 lb 12.8 oz (62.1 kg)  01/12/22 142 lb 9.6 oz (64.7 kg)  01/08/22 140 lb 6.4 oz (63.7 kg)    Physical Exam Vitals reviewed.  Constitutional:      General: He is not in acute distress.    Appearance: He is not diaphoretic.  HENT:     Head: Normocephalic and atraumatic.     Nose: Nose normal.     Mouth/Throat:     Mouth: Mucous membranes are moist.     Pharynx: Oropharynx is clear.  Eyes:     Conjunctiva/sclera: Conjunctivae normal.     Pupils: Pupils are equal, round, and reactive to light.  Neck:     Thyroid: No thyromegaly.     Vascular: No JVD.     Trachea: No tracheal deviation.  Cardiovascular:     Rate and Rhythm: Normal rate and regular rhythm.     Heart sounds: No murmur heard. Pulmonary:     Effort: Pulmonary effort is normal. No respiratory distress.     Breath sounds: No wheezing or rales.     Comments: Rapid breathing followed by apnea.  Abdominal:     General: Bowel sounds are normal. There is no distension.     Tenderness: There is no abdominal tenderness.     Comments: Slight firmness to abd. Hypoactive BS x 4  Musculoskeletal:     Right lower leg: No edema.     Left lower leg: No edema.  Lymphadenopathy:     Cervical: No cervical adenopathy.  Skin:    General: Skin is warm and dry.     Coloration: Skin is pale.  Neurological:     Mental Status: He is alert.     Comments: Left arm weakness due to  prior CVA. Looking to the right and neglecting the left more. Some difficulty following commands. But able to squeeze my hand and follow a few simple commands.      Labs reviewed: Recent Labs    08/11/21 0408 08/12/21 0107 08/16/21 0000 12/13/21 1157 12/14/21 0000 12/20/21 0000 12/27/21 0000 01/03/22 1322  NA 140 142   < >  --    < > 142 144 144  K 3.6 3.9   < >  --    < > 3.6 3.7 3.7  CL 109 113*   < >  --    < > 103 104 108  CO2 24 23   < >  --    < > 26* 27* 25  GLUCOSE 120* 143*  --   --   --   --   --  120*  BUN 17 17   < >  --    < > 28* 27* 27*  CREATININE 0.81 0.83   < >  --    < > 0.9 0.9 1.18  CALCIUM 8.6* 8.7*   < >  --    < > 8.9 9.7 9.1  MG 1.9 2.0  --  2.4  --   --   --   --    < > = values in  this interval not displayed.    Recent Labs    08/10/21 0937 08/11/21 0408 09/07/21 0000 09/08/21 0000 01/03/22 1322  AST '29 27 24 24 25  '$ ALT '26 23 18 18 20  '$ ALKPHOS 93 80 105 105 100  BILITOT 0.5 0.8  --   --  1.1  PROT 6.4* 5.8*  --   --  7.4  ALBUMIN 3.3* 3.1* 3.5 3.5 3.6    Recent Labs    07/12/21 1056 08/10/21 0937 08/10/21 0957 08/11/21 0408 08/12/21 0107 09/07/21 0000 11/01/21 0000 11/08/21 0000 12/06/21 0000  WBC 9.0 16.1*  --  9.1 10.2   < > 8.9 7.9 8.1  NEUTROABS 6,804 14.3*  --   --  7.8*  --   --   --   --   HGB 10.9* 10.0*   < > 9.4* 9.4*   < > 9.4* 9.9* 9.2*  HCT 33.9* 31.6*   < > 28.7* 29.8*   < > 28* 30* 28*  MCV 90.4 96.3  --  92.0 94.3  --   --   --   --   PLT 426* 238  --  218 227   < > 258 295 338   < > = values in this interval not displayed.    Lab Results  Component Value Date   TSH 1.773 01/24/2021   Lab Results  Component Value Date   HGBA1C 5.7 07/05/2021   Lab Results  Component Value Date   CHOL 110 07/05/2021   HDL 43 07/05/2021   LDLCALC 57 07/05/2021   TRIG 51 07/05/2021   CHOLHDL 2.8 08/08/2020    Significant Diagnostic Results in last 30 days:  ECHOCARDIOGRAM COMPLETE  Result Date: 01/03/2022     ECHOCARDIOGRAM REPORT   Patient Name:   Nomar TABARI VOLKERT Date of Exam: 01/03/2022 Medical Rec #:  [id number]     Height:       67.0 in Accession #:    [id number]    Weight:       142.4 lb Date of Birth:  1940-11-10     BSA:          1.750 m Patient Age:    19 years      BP:           105/68 mmHg Patient Gender: M             HR:           85 bpm. Exam Location:  Outpatient Procedure: 2D Echo, Color Doppler and Cardiac Doppler Indications:    L49.17 Chronic systolic (congestive) heart failure  History:        Patient has prior history of Echocardiogram examinations, most                 recent 03/31/2021. Arrythmias:Atrial Fibrillation; Risk                 Factors:Hypertension, Dyslipidemia and Sleep Apnea. 09/28/2004                 Cardiac Surgery with 57m Carpentier Edwards Bioprosthetic AVR,                 252mCarpentier Edwards Bioprosthetic MVR, LAA closure, and Maze                 procedure.  Sonographer:    EmRaquel Sarnaenior RDCS Referring Phys: [id number]: Technically difficult due to patient position, scanned reclined in wheelchair.  IMPRESSIONS  1. Left ventricular ejection fraction, by estimation, is 55%. The left ventricle has normal function. The left ventricle has no regional wall motion abnormalities. There is moderate concentric left ventricular hypertrophy. Left ventricular diastolic parameters are indeterminate.  2. Mildly D-shaped interventricular septum suggestive of RV pressure/volume overload. Right ventricular systolic function is moderately reduced. The right ventricular size is moderately enlarged. There is severely elevated pulmonary artery systolic pressure. The estimated right ventricular systolic pressure is 46.5 mmHg.  3. Left atrial size was severely dilated.  4. Right atrial size was moderately dilated.  5. Bioprosthetic mitral valve. Mild mitral valve regurgitation. Thickened mitral valve with concern for vegetation. Mean gradient 20 mmHg with MVA 1.1 cm^2.  Severe bioprosthetic mitral valve stenosis.  6. Bioprosthetic aortic valve. There is moderate thickening and restriction of the aortic valve. Aortic valve regurgitation is not visualized. Aortic valve area, by VTI measures 1.31 cm. Aortic valve mean gradient measures 39.0 mmHg. DI 0.26. 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 Cannot rule out vegatation.  7. Aortic dilatation noted. 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  8. The inferior vena cava is dilated in size with <50% respiratory variability, suggesting right atrial pressure of 15 mmHg. FINDINGS  Left Ventricle: Left ventricular ejection fraction, by estimation, is 55%. The left ventricle has normal function. The left ventricle has no regional wall motion abnormalities. The left ventricular internal cavity size was normal in size. There is moderate concentric left ventricular hypertrophy. Left ventricular diastolic parameters are indeterminate. Right Ventricle: Mildly D-shaped interventricular septum suggestive of RV pressure/volume overload. The right ventricular size is moderately enlarged. No increase in right ventricular wall thickness. Right ventricular systolic function is moderately reduced. There is severely elevated pulmonary artery systolic pressure. The tricuspid regurgitant velocity is 3.66 m/s, and with an assumed right atrial pressure of 15 mmHg, the estimated right ventricular systolic pressure is 03.5 mmHg. Left Atrium: Left atrial size was severely dilated. Right Atrium: Right atrial size was moderately dilated. Pericardium: There is no evidence of pericardial effusion. Mitral Valve: The mitral valve has been repaired/replaced. Mild mitral valve regurgitation. Severe mitral valve stenosis. MV peak gradient, 40.9 mmHg. The mean mitral valve gradient is 20.0 mmHg. Tricuspid Valve: The tricuspid valve is normal in structure. Tricuspid valve regurgitation is trivial. Aortic Valve: The aortic valve has  been repaired/replaced. There is moderate thickening of the aortic valve. Aortic valve regurgitation is not visualized. Aortic valve mean gradient measures 39.0 mmHg. Aortic valve peak gradient measures 64.0 mmHg. Aortic valve area, by VTI measures 1.31 cm. Pulmonic Valve: The pulmonic valve was normal in structure. Pulmonic valve regurgitation is trivial. Aorta: Aortic dilatation noted. 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 Venous: The inferior vena cava is dilated in size with less than 50% respiratory variability, suggesting right atrial pressure of 15 mmHg. IAS/Shunts: No atrial level shunt detected by color flow Doppler.  LEFT VENTRICLE PLAX 2D LVIDd:         3.50 cm   Diastology LVIDs:         2.30 cm   LV e' medial:    3.37 cm/s LV PW:         1.70 cm   LV E/e' medial:  87.2 LV IVS:        1.40 cm   LV e' lateral:   6.56 cm/s LVOT diam:     2.50 cm   LV E/e' lateral: 44.8 LV SV:  95 LV SV Index:   54 LVOT Area:     4.91 cm  RIGHT VENTRICLE RV S prime:     5.93 cm/s TAPSE (M-mode): 1.3 cm LEFT ATRIUM              Index        RIGHT ATRIUM           Index LA diam:        5.20 cm  2.97 cm/m   RA Area:     25.30 cm LA Vol (A2C):   158.0 ml 90.26 ml/m  RA Volume:   70.00 ml  39.99 ml/m LA Vol (A4C):   122.0 ml 69.70 ml/m LA Biplane Vol: 146.0 ml 83.41 ml/m  AORTIC VALVE AV Area (Vmax):    1.26 cm AV Area (Vmean):   1.29 cm AV Area (VTI):     1.31 cm AV Vmax:           400.00 cm/s AV Vmean:          292.000 cm/s AV VTI:            0.722 m AV Peak Grad:      64.0 mmHg AV Mean Grad:      39.0 mmHg LVOT Vmax:         103.00 cm/s LVOT Vmean:        76.600 cm/s LVOT VTI:          0.193 m LVOT/AV VTI ratio: 0.27  AORTA Ao Root diam: 4.20 cm Ao Asc diam:  5.00 cm MITRAL VALVE                TRICUSPID VALVE MV Area (PHT): 0.87 cm     TR Peak grad:   53.6 mmHg MV Area VTI:   1.08 cm     TR Vmax:        366.00 cm/s MV Peak grad:  40.9 mmHg MV Mean grad:  20.0 mmHg    SHUNTS  MV Vmax:       3.20 m/s     Systemic VTI:  0.19 m MV Vmean:      227.7 cm/s   Systemic Diam: 2.50 cm MV E velocity: 294.00 cm/s Dalton McleanMD Electronically signed by Franki Monte Signature Date/Time: 01/03/2022/12:37:20 PM    Final    CT Chest Wo Contrast  Result Date: 12/16/2021 CLINICAL DATA:  81 year old male with history of rheumatic fever with suspected aspiration. EXAM: CT CHEST WITHOUT CONTRAST TECHNIQUE: Multidetector CT imaging of the chest was performed following the standard protocol without IV contrast. RADIATION DOSE REDUCTION: This exam was performed according to the departmental dose-optimization program which includes automated exposure control, adjustment of the mA and/or kV according to patient size and/or use of iterative reconstruction technique. COMPARISON:  No priors. FINDINGS: Cardiovascular: Heart size is enlarged with left atrial dilatation. There is no significant pericardial fluid, thickening or pericardial calcification. There is aortic atherosclerosis, as well as atherosclerosis of the great vessels of the mediastinum and the coronary arteries, including calcified atherosclerotic plaque in the left main, left anterior descending, left circumflex and right coronary arteries. Severe calcifications of the mitral annulus. Status post median sternotomy for aortic and mitral valve replacement with what appear to be a stented bioprosthesis. Aneurysmal dilatation of the ascending thoracic aorta (5.1 cm in diameter). Dilatation of the pulmonic trunk (4.8 cm in diameter). Mediastinum/Nodes: Multiple prominent borderline enlarged lymph nodes are noted throughout the mediastinal and hilar nodal stations measuring up to 1.3 cm  in short axis in the low right paratracheal nodal station. Esophagus is unremarkable in appearance. No axillary lymphadenopathy. Lungs/Pleura: Widespread areas of ground-glass attenuation and mild interlobular septal thickening are noted throughout the lungs  bilaterally. No confluent consolidative airspace disease. Small right pleural effusion. No left pleural effusion. No definite suspicious appearing pulmonary nodules or masses are noted on today's examination which is limited by considerable patient respiratory motion. Upper Abdomen: Aortic atherosclerosis. Musculoskeletal: Median sternotomy wires. There are no aggressive appearing lytic or blastic lesions noted in the visualized portions of the skeleton. IMPRESSION: 1. The appearance of the chest is most suggestive of congestive heart failure. Specifically, there is cardiomegaly with what appears to be mild interstitial pulmonary edema and small right pleural effusion. 2. 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 3. Cardiomegaly with left atrial dilatation. 4. Aortic atherosclerosis, in addition to left main and three-vessel coronary artery disease. 5. Additional incidental findings, as above. Aortic Atherosclerosis (ICD10-I70.0). Electronically Signed   By: Vinnie Langton M.D.   On: 12/16/2021 06:42    Assessment/Plan  1. 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  I do not think he should wear bipap during the day unless he is asleep for quality of life reasons. He will continue to wear bipap at night and during naps.   2. [name]-Stokes breathing Has risk factors which include CHF, CVA,TBI, and sleep apnea.  Seems to be worsening with 30 second periods of apnea when awake. Also looking to the right. ?another neurological event. His goals of care more comfort based now. He is  DNR. Family has declined hospice due to the fact he is getting OT and his med list is complex and may not be covered under hospice. Discussed adding xanax or morphine. His wife Herbert Pun will think about that and I will check back with her tomorrow.   3. 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    Family/ staff Communication: Glenna Fellows ordered:  BMP weekly

## 2022-01-13 NOTE — Telephone Encounter (Signed)
I agree with mask fit appointment. They have to make appointment with DME provider.  I do not recommend IM Ativan prn at this time. I will have Dr. Brett Fairy review and weigh in upon her return.

## 2022-01-13 NOTE — Telephone Encounter (Signed)
Pt's wife,[name] [name] (on DPR) Pt is in a facility; apnea has worsening, lasting 33 seconds per event.  Regular respiration 22 per minutes. Would like a call from the nurse.

## 2022-01-14 DIAGNOSIS — I69812 Visuospatial deficit and spatial neglect following other cerebrovascular disease: Secondary | ICD-10-CM | POA: Diagnosis not present

## 2022-01-14 DIAGNOSIS — R41840 Attention and concentration deficit: Secondary | ICD-10-CM | POA: Diagnosis not present

## 2022-01-14 DIAGNOSIS — I5022 Chronic systolic (congestive) heart failure: Secondary | ICD-10-CM | POA: Diagnosis not present

## 2022-01-14 DIAGNOSIS — S065X3S Traumatic subdural hemorrhage with loss of consciousness of 1 hour to 5 hours 59 minutes, sequela: Secondary | ICD-10-CM | POA: Diagnosis not present

## 2022-01-14 DIAGNOSIS — R41841 Cognitive communication deficit: Secondary | ICD-10-CM | POA: Diagnosis not present

## 2022-01-14 DIAGNOSIS — I69354 Hemiplegia and hemiparesis following cerebral infarction affecting left non-dominant side: Secondary | ICD-10-CM | POA: Diagnosis not present

## 2022-01-14 DIAGNOSIS — I69391 Dysphagia following cerebral infarction: Secondary | ICD-10-CM | POA: Diagnosis not present

## 2022-01-14 DIAGNOSIS — R293 Abnormal posture: Secondary | ICD-10-CM | POA: Diagnosis not present

## 2022-01-14 DIAGNOSIS — G4089 Other seizures: Secondary | ICD-10-CM | POA: Diagnosis not present

## 2022-01-14 DIAGNOSIS — R278 Other lack of coordination: Secondary | ICD-10-CM | POA: Diagnosis not present

## 2022-01-14 DIAGNOSIS — M6389 Disorders of muscle in diseases classified elsewhere, multiple sites: Secondary | ICD-10-CM | POA: Diagnosis not present

## 2022-01-14 DIAGNOSIS — H538 Other visual disturbances: Secondary | ICD-10-CM | POA: Diagnosis not present

## 2022-01-17 ENCOUNTER — Encounter: Payer: Self-pay | Admitting: Internal Medicine

## 2022-01-17 DIAGNOSIS — H538 Other visual disturbances: Secondary | ICD-10-CM | POA: Diagnosis not present

## 2022-01-17 DIAGNOSIS — R41840 Attention and concentration deficit: Secondary | ICD-10-CM | POA: Diagnosis not present

## 2022-01-17 DIAGNOSIS — I69812 Visuospatial deficit and spatial neglect following other cerebrovascular disease: Secondary | ICD-10-CM | POA: Diagnosis not present

## 2022-01-17 DIAGNOSIS — M6389 Disorders of muscle in diseases classified elsewhere, multiple sites: Secondary | ICD-10-CM | POA: Diagnosis not present

## 2022-01-17 DIAGNOSIS — R278 Other lack of coordination: Secondary | ICD-10-CM | POA: Diagnosis not present

## 2022-01-17 DIAGNOSIS — R293 Abnormal posture: Secondary | ICD-10-CM | POA: Diagnosis not present

## 2022-01-17 DIAGNOSIS — I69391 Dysphagia following cerebral infarction: Secondary | ICD-10-CM | POA: Diagnosis not present

## 2022-01-17 DIAGNOSIS — I69354 Hemiplegia and hemiparesis following cerebral infarction affecting left non-dominant side: Secondary | ICD-10-CM | POA: Diagnosis not present

## 2022-01-17 DIAGNOSIS — I5022 Chronic systolic (congestive) heart failure: Secondary | ICD-10-CM | POA: Diagnosis not present

## 2022-01-17 DIAGNOSIS — R41841 Cognitive communication deficit: Secondary | ICD-10-CM | POA: Diagnosis not present

## 2022-01-17 DIAGNOSIS — G4089 Other seizures: Secondary | ICD-10-CM | POA: Diagnosis not present

## 2022-01-17 DIAGNOSIS — I5033 Acute on chronic diastolic (congestive) heart failure: Secondary | ICD-10-CM | POA: Diagnosis not present

## 2022-01-17 DIAGNOSIS — S065X3S Traumatic subdural hemorrhage with loss of consciousness of 1 hour to 5 hours 59 minutes, sequela: Secondary | ICD-10-CM | POA: Diagnosis not present

## 2022-01-18 ENCOUNTER — Telehealth: Payer: Self-pay | Admitting: Internal Medicine

## 2022-01-18 DIAGNOSIS — I69812 Visuospatial deficit and spatial neglect following other cerebrovascular disease: Secondary | ICD-10-CM | POA: Diagnosis not present

## 2022-01-18 DIAGNOSIS — S065X3S Traumatic subdural hemorrhage with loss of consciousness of 1 hour to 5 hours 59 minutes, sequela: Secondary | ICD-10-CM | POA: Diagnosis not present

## 2022-01-18 DIAGNOSIS — I5022 Chronic systolic (congestive) heart failure: Secondary | ICD-10-CM | POA: Diagnosis not present

## 2022-01-18 DIAGNOSIS — G4089 Other seizures: Secondary | ICD-10-CM | POA: Diagnosis not present

## 2022-01-18 DIAGNOSIS — R41840 Attention and concentration deficit: Secondary | ICD-10-CM | POA: Diagnosis not present

## 2022-01-18 DIAGNOSIS — R41841 Cognitive communication deficit: Secondary | ICD-10-CM | POA: Diagnosis not present

## 2022-01-18 DIAGNOSIS — H538 Other visual disturbances: Secondary | ICD-10-CM | POA: Diagnosis not present

## 2022-01-18 DIAGNOSIS — I69391 Dysphagia following cerebral infarction: Secondary | ICD-10-CM | POA: Diagnosis not present

## 2022-01-18 DIAGNOSIS — R293 Abnormal posture: Secondary | ICD-10-CM | POA: Diagnosis not present

## 2022-01-18 DIAGNOSIS — I69354 Hemiplegia and hemiparesis following cerebral infarction affecting left non-dominant side: Secondary | ICD-10-CM | POA: Diagnosis not present

## 2022-01-18 DIAGNOSIS — R278 Other lack of coordination: Secondary | ICD-10-CM | POA: Diagnosis not present

## 2022-01-18 DIAGNOSIS — M6389 Disorders of muscle in diseases classified elsewhere, multiple sites: Secondary | ICD-10-CM | POA: Diagnosis not present

## 2022-01-18 NOTE — Telephone Encounter (Signed)
Todays Labs showed Sodium of 152 and BUN and Creat is 43 /1.23 Holding Torsemide and potassium His weight is also down to 133. Repeat BMP on Thurs Encourage Po Fluids. He is drinking a lot. D/w wife

## 2022-01-19 ENCOUNTER — Encounter: Payer: Self-pay | Admitting: Physical Medicine & Rehabilitation

## 2022-01-19 ENCOUNTER — Encounter: Payer: Medicare Other | Attending: Physical Medicine & Rehabilitation | Admitting: Physical Medicine & Rehabilitation

## 2022-01-19 VITALS — BP 111/72 | HR 75

## 2022-01-19 DIAGNOSIS — M6389 Disorders of muscle in diseases classified elsewhere, multiple sites: Secondary | ICD-10-CM | POA: Diagnosis not present

## 2022-01-19 DIAGNOSIS — G8114 Spastic hemiplegia affecting left nondominant side: Secondary | ICD-10-CM | POA: Diagnosis not present

## 2022-01-19 DIAGNOSIS — H538 Other visual disturbances: Secondary | ICD-10-CM | POA: Diagnosis not present

## 2022-01-19 DIAGNOSIS — I69812 Visuospatial deficit and spatial neglect following other cerebrovascular disease: Secondary | ICD-10-CM | POA: Diagnosis not present

## 2022-01-19 DIAGNOSIS — I69391 Dysphagia following cerebral infarction: Secondary | ICD-10-CM | POA: Diagnosis not present

## 2022-01-19 DIAGNOSIS — R41840 Attention and concentration deficit: Secondary | ICD-10-CM | POA: Diagnosis not present

## 2022-01-19 DIAGNOSIS — I5022 Chronic systolic (congestive) heart failure: Secondary | ICD-10-CM | POA: Diagnosis not present

## 2022-01-19 DIAGNOSIS — R278 Other lack of coordination: Secondary | ICD-10-CM | POA: Diagnosis not present

## 2022-01-19 DIAGNOSIS — I69354 Hemiplegia and hemiparesis following cerebral infarction affecting left non-dominant side: Secondary | ICD-10-CM | POA: Diagnosis not present

## 2022-01-19 DIAGNOSIS — S065X3S Traumatic subdural hemorrhage with loss of consciousness of 1 hour to 5 hours 59 minutes, sequela: Secondary | ICD-10-CM | POA: Diagnosis not present

## 2022-01-19 DIAGNOSIS — G4089 Other seizures: Secondary | ICD-10-CM | POA: Diagnosis not present

## 2022-01-19 DIAGNOSIS — R293 Abnormal posture: Secondary | ICD-10-CM | POA: Diagnosis not present

## 2022-01-19 DIAGNOSIS — R41841 Cognitive communication deficit: Secondary | ICD-10-CM | POA: Diagnosis not present

## 2022-01-19 MED ORDER — ONABOTULINUMTOXINA 100 UNITS IJ SOLR
400.0000 [IU] | Freq: Once | INTRAMUSCULAR | Status: AC
  Administered 2022-01-19: 400 [IU] via INTRAMUSCULAR

## 2022-01-19 MED ORDER — SODIUM CHLORIDE (PF) 0.9 % IJ SOLN
4.0000 mL | Freq: Every day | INTRAMUSCULAR | Status: DC
  Administered 2022-01-19: 4 mL via INTRAVENOUS

## 2022-01-19 NOTE — Progress Notes (Signed)
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 This includes bleeding, bruising, infection, excessive weakness, or medication side effects. A REMS form is on file and signed.  Needle: 32m injectable monopolar needle electrode    Number of units per muscle Pectoralis Major 0 units Pectoralis Minor 0 units Biceps 150 units Brachioradialis 50 units FCR 25 units FCU 25 units FDS 75 units FDP 75 units FPL 0 units Palmaris Longus 0 units Pronator Teres 0 units Pronator Quadratus 0 units Lumbricals 0 units All injections were done after obtaining appropriate EMG activity and after negative drawback for blood. The patient tolerated the procedure well. Post procedure instructions were given. Return in about 3 months (around 04/21/2022) for 400u botox left wrist and finger flexors.

## 2022-01-19 NOTE — Patient Instructions (Signed)
ALWAYS FEEL FREE TO CALL OUR OFFICE WITH ANY PROBLEMS OR QUESTIONS ([phone])  **PLEASE NOTE** ALL MEDICATION REFILL REQUESTS (INCLUDING CONTROLLED SUBSTANCES) NEED TO BE MADE AT LEAST 7 DAYS PRIOR TO REFILL BEING DUE. ANY REFILL REQUESTS INSIDE THAT TIME FRAME MAY RESULT IN DELAYS IN RECEIVING YOUR PRESCRIPTION.                    

## 2022-01-20 ENCOUNTER — Telehealth: Payer: Self-pay | Admitting: Adult Health

## 2022-01-20 DIAGNOSIS — G4089 Other seizures: Secondary | ICD-10-CM | POA: Diagnosis not present

## 2022-01-20 DIAGNOSIS — I69391 Dysphagia following cerebral infarction: Secondary | ICD-10-CM | POA: Diagnosis not present

## 2022-01-20 DIAGNOSIS — R41841 Cognitive communication deficit: Secondary | ICD-10-CM | POA: Diagnosis not present

## 2022-01-20 DIAGNOSIS — I69354 Hemiplegia and hemiparesis following cerebral infarction affecting left non-dominant side: Secondary | ICD-10-CM | POA: Diagnosis not present

## 2022-01-20 DIAGNOSIS — I5033 Acute on chronic diastolic (congestive) heart failure: Secondary | ICD-10-CM | POA: Diagnosis not present

## 2022-01-20 LAB — BASIC METABOLIC PANEL
BUN: 28 — AB (ref 4–21)
CO2: 27 — AB (ref 13–22)
Chloride: 104 (ref 99–108)
Creatinine: 0.9 (ref 0.6–1.3)
Glucose: 98
Potassium: 3.5 mEq/L (ref 3.5–5.1)
Sodium: 143 (ref 137–147)

## 2022-01-20 LAB — COMPREHENSIVE METABOLIC PANEL
Calcium: 9.7 (ref 8.7–10.7)
eGFR: 87

## 2022-01-20 LAB — CBC AND DIFFERENTIAL
HCT: 30 — AB (ref 41–53)
Hemoglobin: 9.5 — AB (ref 13.5–17.5)
Platelets: 253 10*3/uL (ref 150–400)
WBC: 10.2

## 2022-01-20 LAB — CBC: RBC: 3.6 — AB (ref 3.87–5.11)

## 2022-01-20 MED ORDER — TORSEMIDE 40 MG PO TABS: 40.0000 mg | ORAL_TABLET | Freq: Every day | ORAL | 0 refills | Status: DC

## 2022-01-20 NOTE — Telephone Encounter (Signed)
Pt sodium reduced to 143 from 152. Torsemide has been on hold. He gained 9 lbs in two days. Appearing short of breath and had brief period of appearing lethargic and pale yellow color which resolved. CMP ordered with next draw. Torsemide restarted at 40 mg.

## 2022-01-21 ENCOUNTER — Other Ambulatory Visit: Payer: Self-pay | Admitting: Adult Health

## 2022-01-21 DIAGNOSIS — I5022 Chronic systolic (congestive) heart failure: Secondary | ICD-10-CM | POA: Diagnosis not present

## 2022-01-21 DIAGNOSIS — R41840 Attention and concentration deficit: Secondary | ICD-10-CM | POA: Diagnosis not present

## 2022-01-21 DIAGNOSIS — S065X3S Traumatic subdural hemorrhage with loss of consciousness of 1 hour to 5 hours 59 minutes, sequela: Secondary | ICD-10-CM | POA: Diagnosis not present

## 2022-01-21 DIAGNOSIS — I69812 Visuospatial deficit and spatial neglect following other cerebrovascular disease: Secondary | ICD-10-CM | POA: Diagnosis not present

## 2022-01-21 DIAGNOSIS — G4089 Other seizures: Secondary | ICD-10-CM | POA: Diagnosis not present

## 2022-01-21 DIAGNOSIS — I69354 Hemiplegia and hemiparesis following cerebral infarction affecting left non-dominant side: Secondary | ICD-10-CM | POA: Diagnosis not present

## 2022-01-21 DIAGNOSIS — M6389 Disorders of muscle in diseases classified elsewhere, multiple sites: Secondary | ICD-10-CM | POA: Diagnosis not present

## 2022-01-21 DIAGNOSIS — I69391 Dysphagia following cerebral infarction: Secondary | ICD-10-CM | POA: Diagnosis not present

## 2022-01-21 DIAGNOSIS — R278 Other lack of coordination: Secondary | ICD-10-CM | POA: Diagnosis not present

## 2022-01-21 DIAGNOSIS — R293 Abnormal posture: Secondary | ICD-10-CM | POA: Diagnosis not present

## 2022-01-21 DIAGNOSIS — R41841 Cognitive communication deficit: Secondary | ICD-10-CM | POA: Diagnosis not present

## 2022-01-21 DIAGNOSIS — H538 Other visual disturbances: Secondary | ICD-10-CM | POA: Diagnosis not present

## 2022-01-21 MED ORDER — LACOSAMIDE 10 MG/ML PO SOLN: 75.0000 mg | Freq: Every day | ORAL | 1 refills | Status: DC

## 2022-01-21 MED ORDER — LACOSAMIDE 10 MG/ML PO SOLN: 50.0000 mg | Freq: Every morning | ORAL | 1 refills | Status: DC

## 2022-01-21 MED ORDER — METHYLPHENIDATE HCL 5 MG PO TABS: 5.0000 mg | ORAL_TABLET | Freq: Two times a day (BID) | ORAL | 0 refills | Status: DC

## 2022-01-21 NOTE — Telephone Encounter (Signed)
[name] [name],  Thank you for responding to [name] [name]'s call.  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   [name] stokes breathing is very concerning . Thankfully, he resides at [facility] and has excellent facility based care, and DNR is in place.  CPAP/ BiPAP /BiPAP ST therapy have been difficult for him to tolerate, he has had large air leaks.  CD

## 2022-01-24 ENCOUNTER — Encounter: Payer: Self-pay | Admitting: Internal Medicine

## 2022-01-24 ENCOUNTER — Non-Acute Institutional Stay (SKILLED_NURSING_FACILITY): Payer: Medicare Other | Admitting: Internal Medicine

## 2022-01-24 DIAGNOSIS — S069X3S Unspecified intracranial injury with loss of consciousness of 1 hour to 5 hours 59 minutes, sequela: Secondary | ICD-10-CM | POA: Diagnosis not present

## 2022-01-24 DIAGNOSIS — I5022 Chronic systolic (congestive) heart failure: Secondary | ICD-10-CM | POA: Diagnosis not present

## 2022-01-24 DIAGNOSIS — I5033 Acute on chronic diastolic (congestive) heart failure: Secondary | ICD-10-CM | POA: Diagnosis not present

## 2022-01-24 DIAGNOSIS — I69354 Hemiplegia and hemiparesis following cerebral infarction affecting left non-dominant side: Secondary | ICD-10-CM | POA: Diagnosis not present

## 2022-01-24 DIAGNOSIS — R569 Unspecified convulsions: Secondary | ICD-10-CM

## 2022-01-24 DIAGNOSIS — H538 Other visual disturbances: Secondary | ICD-10-CM | POA: Diagnosis not present

## 2022-01-24 DIAGNOSIS — R41840 Attention and concentration deficit: Secondary | ICD-10-CM | POA: Diagnosis not present

## 2022-01-24 DIAGNOSIS — G4089 Other seizures: Secondary | ICD-10-CM | POA: Diagnosis not present

## 2022-01-24 DIAGNOSIS — E870 Hyperosmolality and hypernatremia: Secondary | ICD-10-CM | POA: Diagnosis not present

## 2022-01-24 DIAGNOSIS — R278 Other lack of coordination: Secondary | ICD-10-CM | POA: Diagnosis not present

## 2022-01-24 DIAGNOSIS — R293 Abnormal posture: Secondary | ICD-10-CM | POA: Diagnosis not present

## 2022-01-24 DIAGNOSIS — I69812 Visuospatial deficit and spatial neglect following other cerebrovascular disease: Secondary | ICD-10-CM | POA: Diagnosis not present

## 2022-01-24 DIAGNOSIS — M6389 Disorders of muscle in diseases classified elsewhere, multiple sites: Secondary | ICD-10-CM | POA: Diagnosis not present

## 2022-01-24 DIAGNOSIS — S065X3S Traumatic subdural hemorrhage with loss of consciousness of 1 hour to 5 hours 59 minutes, sequela: Secondary | ICD-10-CM | POA: Diagnosis not present

## 2022-01-24 NOTE — Progress Notes (Unsigned)
Location: Occupational psychologist of Service:  SNF (31)  Provider:   Code Status: DNR Goals of Care:     12/27/2021   10:36 AM  Advanced Directives  Does Patient Have a Medical Advance Directive? Yes  Type of Paramedic of [place];Living will  Does patient want to make changes to medical advance directive? No - Patient declined  Copy of [place] in Chart? Yes - validated most recent copy scanned in chart (See row information)     Chief Complaint  Patient presents with   Acute Visit    HPI: Patient is a 81 y.o. 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 using BIPAP  CHF Trying to balance torsemide with his Hypernatremia Had to hold it for few days he is back on it again Weight has slowly gone up  Got extra dose 20 mg yesterday Look comfortable with his breathing Seizure  Had another seizure on Weekend At night shift around 8 PM Lasted 2 minutes Family has been concerned about the Lethargy with Vimpat   So l have not increase the morning dose yet Wife Requesting to wait after Thanksgiving to make any change is  also waiting for hospice as he is still to continue therapy  cardiology had also suggested hospice due to worsening valvular disease  Patient continues to be responsive but not consistent in following Commands  Past Medical History:  Diagnosis Date   Acute rheumatic heart disease, unspecified    childhood, age  25 & 87   Acute rheumatic pericarditis    Atrial fibrillation ([place])    history   CHF (congestive heart failure)  ([place])    Diverticulosis    Dysrhythmia    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   External hemorrhoids without mention of complication    H/O aortic valve replacement    H/O mitral valve replacement    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   Lesion of ulnar nerve    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    Other and unspecified hyperlipidemia    Pre-diabetes    Previous back surgery 1978, jan 2007   Psychosexual dysfunction with inhibited sexual excitement    SOB (shortness of breath)    "with heavy exercise"   Stroke ([place]) 08/2013   "I WAS IN AFIB AND THREW A CLOT, THE EFFECTS WERE TRANSITORY AND DIDNT LAST BUT FOR 30 MINUTES"    Thoracic aortic aneurysm [facility])     Past Surgical History:  Procedure Laterality Date   AORTIC AND MITRAL VALVE REPLACEMENT     09/2004   CARDIOVERSION     3 times from 2004-2006   CARDIOVERSION N/A 09/26/2013   Procedure: CARDIOVERSION;  Surgeon: Larey Dresser, MD;  Location: [place];  Service: Cardiovascular;  Laterality: N/A;   CARDIOVERSION N/A 06/19/2014   Procedure: CARDIOVERSION;  Surgeon: Jerline Pain, MD;  Location: [place];  Service: Cardiovascular;  Laterality: N/A;   CARDIOVERSION N/A 04/24/2017   Procedure: CARDIOVERSION;  Surgeon: Larey Dresser, MD;  Location[facility]  ENDOSCOPY;  Service: Cardiovascular;  Laterality: N/A;   CARDIOVERSION N/A 06/08/2017   Procedure: CARDIOVERSION;  Surgeon: Pixie Casino, MD;  Location: [facility] ENDOSCOPY;  Service: Cardiovascular;  Laterality: N/A;   CARDIOVERSION N/A 08/24/2017   Procedure: CARDIOVERSION;  Surgeon: Skeet Latch, MD;  Location: [facility] ENDOSCOPY;  Service: Cardiovascular;  Laterality: N/A;   CARDIOVERSION N/A 02/14/2018   Procedure: CARDIOVERSION;  Surgeon: Larey Dresser, MD;  Location: [facility] ENDOSCOPY;  Service: Cardiovascular;  Laterality: N/A;   COLONOSCOPY     CRANIOTOMY N/A 07/28/2020   Procedure: CRANIOTOMY FOR EVACUATION OF SUBDURAL HEMATOMA;  Surgeon: Eustace Mazon, MD;   Location: [place];  Service: Neurosurgery;  Laterality: N/A;   IR ANGIO VERTEBRAL SEL SUBCLAVIAN INNOMINATE UNI R MOD SED  08/07/2020   IR CT HEAD LTD  08/07/2020   IR PERCUTANEOUS ART THROMBECTOMY/INFUSION INTRACRANIAL INC DIAG ANGIO  08/07/2020   laminectomies     10/1975 and in 03/2005   RADIOLOGY WITH ANESTHESIA N/A 08/07/2020   Procedure: IR WITH ANESTHESIA;  Surgeon: Luanne Bras, MD;  Location: [place];  Service: Radiology;  Laterality: N/A;   TEE WITHOUT CARDIOVERSION N/A 12/18/2020   Procedure: TRANSESOPHAGEAL ECHOCARDIOGRAM (TEE);  Surgeon: Larey Dresser, MD;  Location: [facility] ENDOSCOPY;  Service: Cardiovascular;  Laterality: N/A;   [place] Right 04/03/2018   Procedure: TOTAL HIP ARTHROPLASTY ANTERIOR APPROACH;  Surgeon: Paralee Cancel, MD;  Location: [facility] ORS;  Service: Orthopedics;  Laterality: Right;  70 mins    Allergies  Allergen Reactions   Naproxen Hives   No [employer] Food Allergies Other (See Comments)    Scallops - distress, nausea and vomitting    Outpatient Encounter Medications as of 01/24/2022  Medication Sig   Torsemide 40 MG TABS Take 40 mg by mouth daily.   acetaminophen (TYLENOL) 325 MG tablet Take 650 mg by mouth as needed for mild pain.   Amantadine HCl 100 MG tablet Take 100 mg by mouth once.   amLODipine (NORVASC) 5 MG tablet Take 0.5 tablets (2.5 mg total) by mouth daily.   amoxicillin (AMOXIL) 500 MG capsule Take 500 mg by mouth 2 (two) times daily.   apixaban (ELIQUIS) 5 MG TABS tablet Take 1 tablet (5 mg total) by mouth 2 (two) times daily.   Artificial Saliva (BIOTENE MOISTURIZING MOUTH MT) Use as directed 2 sprays in the mouth or throat in the morning, at noon, and at bedtime.   atorvastatin (LIPITOR) 80 MG tablet Take 1 tablet (80 mg total) by mouth daily.   bisacodyl (DULCOLAX) 10 MG suppository Place 1 suppository (10 mg total) rectally daily as needed for moderate constipation or mild constipation.   clobetasol  (TEMOVATE) 0.05 % external solution Apply 1 application  topically as needed (itching).   diazePAM, 15 MG Dose, (VALTOCO 15 MG DOSE) 2 x 7.5 MG/0.1ML LQPK Place 7.5 mg into each nostril (total of 15 mg) for grand mal seizure lasting over 5 minutes. Can repeat once in 4 hours. Do Not use more than twice in 24 hours   dofetilide (TIKOSYN) 125 MCG capsule Take 3 capsules (375 mcg total) by mouth 2 (two) times daily.   famotidine (PEPCID) 20 MG tablet Take 20 mg by mouth at bedtime as needed for indigestion.   finasteride (PROSCAR) 5 MG tablet Take 1 tablet (5 mg total) by mouth daily.   Glucerna (GLUCERNA) LIQD Take 237 mLs by mouth daily.   hydrocortisone 2.5 % cream Apply 1 application. topically daily  as needed (itching).   lacosamide (VIMPAT) 10 MG/ML oral solution Take 7.5 mLs (75 mg total) by mouth at bedtime.   lacosamide (VIMPAT) 10 MG/ML oral solution Take 5 mLs (50 mg total) by mouth in the morning.   levalbuterol (XOPENEX) 0.63 MG/3ML nebulizer solution Take 0.63 mg by nebulization every 6 (six) hours as needed for wheezing or shortness of breath.   magnesium gluconate (MAGONATE) 500 MG tablet Take 1 tablet (500 mg total) by mouth daily.   melatonin 5 MG TABS Take 5 mg by mouth at bedtime.   methenamine (MANDELAMINE) 1 g tablet Take 1,000 mg by mouth 2 (two) times daily.   methylphenidate (RITALIN) 5 MG tablet Take 1 tablet (5 mg total) by mouth 2 (two) times daily. Take at 7 am and 12 pm   Multiple Vitamin (MULTIVITAMIN WITH MINERALS) TABS tablet Take 1 tablet by mouth daily.   omeprazole (PRILOSEC) 20 MG capsule Take 20 mg by mouth daily.   Polyethyl Glycol-Propyl Glycol (SYSTANE) 0.4-0.3 % SOLN Place 2 drops into both eyes daily.   polyethylene glycol (MIRALAX / GLYCOLAX) 17 g packet Take 17 g by mouth every other day.   potassium chloride (KLOR-CON) 10 MEQ tablet Take 2 tablets (20 mEq total) by mouth daily.   senna-docusate (SENOKOT-S) 8.6-50 MG tablet Take 1 tablet by mouth at  bedtime as needed.   Facility-Administered Encounter Medications as of 01/24/2022  Medication   sodium chloride (PF) 0.9 % injection 4 mL    Review of Systems:  Review of Systems  Unable to perform ROS: Other    Health Maintenance  Topic Date Due   Diabetic kidney evaluation - Urine ACR  Never done   Medicare Annual Wellness (AWV)  07/10/2020   COVID-19 Vaccine (4 - Moderna risk series) 03/09/2021   HEMOGLOBIN A1C  01/05/2022   OPHTHALMOLOGY EXAM  10/06/2022 (Originally 10/04/1950)   FOOT EXAM  10/23/2022   Diabetic kidney evaluation - GFR measurement  01/14/2023   Pneumonia Vaccine 29+ Years old  Completed   INFLUENZA VACCINE  Completed   Zoster Vaccines- Shingrix  Completed   HPV VACCINES  Aged Out   COLONOSCOPY (Pts 45-71yr Insurance coverage will need to be confirmed)  Discontinued   Hepatitis C Screening  Discontinued    Physical Exam: There were no vitals filed for this visit. There is no height or weight on file to calculate BMI. Physical Exam Vitals reviewed.  HENT:     Head: Normocephalic.     Nose: Nose normal.     Mouth/Throat:     Mouth: Mucous membranes are moist.     Pharynx: Oropharynx is clear.  Eyes:     Pupils: Pupils are equal, round, and reactive to light.  Cardiovascular:     Rate and Rhythm: Normal rate and regular rhythm.     Pulses: Normal pulses.     Heart sounds: No murmur heard. Pulmonary:     Effort: Pulmonary effort is normal. No respiratory distress.     Breath sounds: Normal breath sounds. No rales.  Abdominal:     General: Abdomen is flat. Bowel sounds are normal.     Palpations: Abdomen is soft.  Musculoskeletal:        General: No swelling.     Cervical back: Neck supple.  Skin:    General: Skin is warm.  Neurological:     Mental Status: He is alert.  Psychiatric:        Mood and Affect: Mood normal.  Thought Content: Thought content normal.     Labs reviewed: Basic Metabolic Panel: Recent Labs     08/11/21 0408 08/12/21 0107 08/16/21 0000 12/13/21 1157 12/14/21 0000 01/03/22 1322 01/10/22 0000 01/13/22 0000  NA 140 142   < >  --    < > 144 4* 147  K 3.6 3.9   < >  --    < > 3.7 3.6 3.6  CL 109 113*   < >  --    < > 108 108 106  CO2 24 23   < >  --    < > 25 24* 27*  GLUCOSE 120* 143*  --   --   --  120*  --   --   BUN 17 17   < >  --    < > 27* 29* 37*  CREATININE 0.81 0.83   < >  --    < > 1.18 1.1 1.2  CALCIUM 8.6* 8.7*   < >  --    < > 9.1 9.5 9.6  MG 1.9 2.0  --  2.4  --   --   --   --    < > = values in this interval not displayed.   Liver Function Tests: Recent Labs    08/10/21 0937 08/11/21 0408 09/07/21 0000 09/08/21 0000 01/03/22 1322  AST '29 27 24 24 25  '$ ALT '26 23 18 18 20  '$ ALKPHOS 93 80 105 105 100  BILITOT 0.5 0.8  --   --  1.1  PROT 6.4* 5.8*  --   --  7.4  ALBUMIN 3.3* 3.1* 3.5 3.5 3.6   No results for input(s): "LIPASE", "AMYLASE" in the last 8760 hours. No results for input(s): "AMMONIA" in the last 8760 hours. CBC: Recent Labs    07/12/21 1056 08/10/21 0937 08/10/21 0957 08/11/21 0408 08/12/21 0107 09/07/21 0000 11/01/21 0000 11/08/21 0000 12/06/21 0000  WBC 9.0 16.1*  --  9.1 10.2   < > 8.9 7.9 8.1  NEUTROABS 6,804 14.3*  --   --  7.8*  --   --   --   --   HGB 10.9* 10.0*   < > 9.4* 9.4*   < > 9.4* 9.9* 9.2*  HCT 33.9* 31.6*   < > 28.7* 29.8*   < > 28* 30* 28*  MCV 90.4 96.3  --  92.0 94.3  --   --   --   --   PLT 426* 238  --  218 227   < > 258 295 338   < > = values in this interval not displayed.   Lipid Panel: Recent Labs    07/05/21 0000  CHOL 110  HDL 43  LDLCALC 57  TRIG 51   Lab Results  Component Value Date   HGBA1C 5.7 07/05/2021    Procedures since last visit: ECHOCARDIOGRAM COMPLETE  Result Date: 01/03/2022    ECHOCARDIOGRAM REPORT   Patient Name:   [name] [name] Date of Exam: 01/03/2022 Medical Rec #:  [id number]     Height:       67.0 in Accession #:    [id number]    Weight:       142.4 lb Date of Birth:   1940-05-13     BSA:          1.750 m Patient Age:    16 years      BP:           105/68  mmHg Patient Gender: M             HR:           85 bpm. Exam Location:  Outpatient Procedure: 2D Echo, Color Doppler and Cardiac Doppler Indications:    T24.58 Chronic systolic (congestive) heart failure  History:        Patient has prior history of Echocardiogram examinations, most                 recent 03/31/2021. Arrythmias:Atrial Fibrillation; Risk                 Factors:Hypertension, Dyslipidemia and Sleep Apnea. 09/28/2004                 Cardiac Surgery with 95m Carpentier Edwards Bioprosthetic AVR,                 247mCarpentier Edwards Bioprosthetic MVR, LAA closure, and Maze                 procedure.  Sonographer:    EmRaquel Sarnaenior RDCS Referring Phys: 37Paxtonomments: Technically difficult due to patient position, scanned reclined in wheelchair. IMPRESSIONS  1. Left ventricular ejection fraction, by estimation, is 55%. The left ventricle has normal function. The left ventricle has no regional wall motion abnormalities. There is moderate concentric left ventricular hypertrophy. Left ventricular diastolic parameters are indeterminate.  2. Mildly D-shaped interventricular septum suggestive of RV pressure/volume overload. Right ventricular systolic function is moderately reduced. The right ventricular size is moderately enlarged. There is severely elevated pulmonary artery systolic pressure. The estimated right ventricular systolic pressure is 6809.9mHg.  3. Left atrial size was severely dilated.  4. Right atrial size was moderately dilated.  5. Bioprosthetic mitral valve. Mild mitral valve regurgitation. Thickened mitral valve with concern for vegetation. Mean gradient 20 mmHg with MVA 1.1 cm^2. Severe bioprosthetic mitral valve stenosis.  6. Bioprosthetic aortic valve. There is moderate thickening and restriction of the aortic valve. Aortic valve regurgitation is not visualized. Aortic valve  area, by VTI measures 1.31 cm. Aortic valve mean gradient measures 39.0 mmHg. DI 0.26. 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 Cannot rule out vegatation.  7. Aortic dilatation noted. 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  8. The inferior vena cava is dilated in size with <50% respiratory variability, suggesting right atrial pressure of 15 mmHg. FINDINGS  Left Ventricle: Left ventricular ejection fraction, by estimation, is 55%. The left ventricle has normal function. The left ventricle has no regional wall motion abnormalities. The left ventricular internal cavity size was normal in size. There is moderate concentric left ventricular hypertrophy. Left ventricular diastolic parameters are indeterminate. Right Ventricle: Mildly D-shaped interventricular septum suggestive of RV pressure/volume overload. The right ventricular size is moderately enlarged. No increase in right ventricular wall thickness. Right ventricular systolic function is moderately reduced. There is severely elevated pulmonary artery systolic pressure. The tricuspid regurgitant velocity is 3.66 m/s, and with an assumed right atrial pressure of 15 mmHg, the estimated right ventricular systolic pressure is 6883.3mHg. Left Atrium: Left atrial size was severely dilated. Right Atrium: Right atrial size was moderately dilated. Pericardium: There is no evidence of pericardial effusion. Mitral Valve: The mitral valve has been repaired/replaced. Mild mitral valve regurgitation. Severe mitral valve stenosis. MV peak gradient, 40.9 mmHg. The mean mitral valve gradient is 20.0 mmHg. Tricuspid Valve: The tricuspid valve is normal in structure. Tricuspid valve regurgitation is trivial.  Aortic Valve: The aortic valve has been repaired/replaced. There is moderate thickening of the aortic valve. Aortic valve regurgitation is not visualized. Aortic valve mean gradient measures 39.0 mmHg. Aortic valve peak gradient  measures 64.0 mmHg. Aortic valve area, by VTI measures 1.31 cm. Pulmonic Valve: The pulmonic valve was normal in structure. Pulmonic valve regurgitation is trivial. Aorta: Aortic dilatation noted. 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 Venous: The inferior vena cava is dilated in size with less than 50% respiratory variability, suggesting right atrial pressure of 15 mmHg. IAS/Shunts: No atrial level shunt detected by color flow Doppler.  LEFT VENTRICLE PLAX 2D LVIDd:         3.50 cm   Diastology LVIDs:         2.30 cm   LV e' medial:    3.37 cm/s LV PW:         1.70 cm   LV E/e' medial:  87.2 LV IVS:        1.40 cm   LV e' lateral:   6.56 cm/s LVOT diam:     2.50 cm   LV E/e' lateral: 44.8 LV SV:         95 LV SV Index:   54 LVOT Area:     4.91 cm  RIGHT VENTRICLE RV S prime:     5.93 cm/s TAPSE (M-mode): 1.3 cm LEFT ATRIUM              Index        RIGHT ATRIUM           Index LA diam:        5.20 cm  2.97 cm/m   RA Area:     25.30 cm LA Vol (A2C):   158.0 ml 90.26 ml/m  RA Volume:   70.00 ml  39.99 ml/m LA Vol (A4C):   122.0 ml 69.70 ml/m LA Biplane Vol: 146.0 ml 83.41 ml/m  AORTIC VALVE AV Area (Vmax):    1.26 cm AV Area (Vmean):   1.29 cm AV Area (VTI):     1.31 cm AV Vmax:           400.00 cm/s AV Vmean:          292.000 cm/s AV VTI:            0.722 m AV Peak Grad:      64.0 mmHg AV Mean Grad:      39.0 mmHg LVOT Vmax:         103.00 cm/s LVOT Vmean:        76.600 cm/s LVOT VTI:          0.193 m LVOT/AV VTI ratio: 0.27  AORTA Ao Root diam: 4.20 cm Ao Asc diam:  5.00 cm MITRAL VALVE                TRICUSPID VALVE MV Area (PHT): 0.87 cm     TR Peak grad:   53.6 mmHg MV Area VTI:   1.08 cm     TR Vmax:        366.00 cm/s MV Peak grad:  40.9 mmHg MV Mean grad:  20.0 mmHg    SHUNTS MV Vmax:       3.20 m/s     Systemic VTI:  0.19 m MV Vmean:      227.7 cm/s   Systemic Diam: 2.50 cm MV E velocity: 294.00 cm/s Dalton McleanMD Electronically signed by Franki Monte Signature  Date/Time: 01/03/2022/12:37:20  PM    Final     Assessment/Plan 1. Seizure [facility]) Family agreed that we have to increase Vimpat at risk of lethargy Change Vimpat to 75 mg BID after thanksgiving   2. Chronic systolic CHF (congestive heart failure) (HCC) On Toresimide 40 mg Give extra doses as needed for SOB  3. Hypernatremia Sodium today is 146  4. 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

## 2022-01-25 DIAGNOSIS — H538 Other visual disturbances: Secondary | ICD-10-CM | POA: Diagnosis not present

## 2022-01-25 DIAGNOSIS — R41840 Attention and concentration deficit: Secondary | ICD-10-CM | POA: Diagnosis not present

## 2022-01-25 DIAGNOSIS — I69354 Hemiplegia and hemiparesis following cerebral infarction affecting left non-dominant side: Secondary | ICD-10-CM | POA: Diagnosis not present

## 2022-01-25 DIAGNOSIS — G4089 Other seizures: Secondary | ICD-10-CM | POA: Diagnosis not present

## 2022-01-25 DIAGNOSIS — I5022 Chronic systolic (congestive) heart failure: Secondary | ICD-10-CM | POA: Diagnosis not present

## 2022-01-25 DIAGNOSIS — R293 Abnormal posture: Secondary | ICD-10-CM | POA: Diagnosis not present

## 2022-01-25 DIAGNOSIS — M6389 Disorders of muscle in diseases classified elsewhere, multiple sites: Secondary | ICD-10-CM | POA: Diagnosis not present

## 2022-01-25 DIAGNOSIS — S065X3S Traumatic subdural hemorrhage with loss of consciousness of 1 hour to 5 hours 59 minutes, sequela: Secondary | ICD-10-CM | POA: Diagnosis not present

## 2022-01-25 DIAGNOSIS — I69812 Visuospatial deficit and spatial neglect following other cerebrovascular disease: Secondary | ICD-10-CM | POA: Diagnosis not present

## 2022-01-25 DIAGNOSIS — R278 Other lack of coordination: Secondary | ICD-10-CM | POA: Diagnosis not present

## 2022-01-26 DIAGNOSIS — I69354 Hemiplegia and hemiparesis following cerebral infarction affecting left non-dominant side: Secondary | ICD-10-CM | POA: Diagnosis not present

## 2022-01-26 DIAGNOSIS — I69812 Visuospatial deficit and spatial neglect following other cerebrovascular disease: Secondary | ICD-10-CM | POA: Diagnosis not present

## 2022-01-26 DIAGNOSIS — M6389 Disorders of muscle in diseases classified elsewhere, multiple sites: Secondary | ICD-10-CM | POA: Diagnosis not present

## 2022-01-26 DIAGNOSIS — R41840 Attention and concentration deficit: Secondary | ICD-10-CM | POA: Diagnosis not present

## 2022-01-26 DIAGNOSIS — R278 Other lack of coordination: Secondary | ICD-10-CM | POA: Diagnosis not present

## 2022-01-26 DIAGNOSIS — R293 Abnormal posture: Secondary | ICD-10-CM | POA: Diagnosis not present

## 2022-01-26 DIAGNOSIS — I5022 Chronic systolic (congestive) heart failure: Secondary | ICD-10-CM | POA: Diagnosis not present

## 2022-01-26 DIAGNOSIS — G4089 Other seizures: Secondary | ICD-10-CM | POA: Diagnosis not present

## 2022-01-26 DIAGNOSIS — S065X3S Traumatic subdural hemorrhage with loss of consciousness of 1 hour to 5 hours 59 minutes, sequela: Secondary | ICD-10-CM | POA: Diagnosis not present

## 2022-01-26 DIAGNOSIS — H538 Other visual disturbances: Secondary | ICD-10-CM | POA: Diagnosis not present

## 2022-01-31 DIAGNOSIS — I5022 Chronic systolic (congestive) heart failure: Secondary | ICD-10-CM | POA: Diagnosis not present

## 2022-01-31 DIAGNOSIS — R41841 Cognitive communication deficit: Secondary | ICD-10-CM | POA: Diagnosis not present

## 2022-01-31 DIAGNOSIS — S065X3S Traumatic subdural hemorrhage with loss of consciousness of 1 hour to 5 hours 59 minutes, sequela: Secondary | ICD-10-CM | POA: Diagnosis not present

## 2022-01-31 DIAGNOSIS — G4089 Other seizures: Secondary | ICD-10-CM | POA: Diagnosis not present

## 2022-01-31 DIAGNOSIS — R41840 Attention and concentration deficit: Secondary | ICD-10-CM | POA: Diagnosis not present

## 2022-01-31 DIAGNOSIS — R278 Other lack of coordination: Secondary | ICD-10-CM | POA: Diagnosis not present

## 2022-01-31 DIAGNOSIS — I69391 Dysphagia following cerebral infarction: Secondary | ICD-10-CM | POA: Diagnosis not present

## 2022-01-31 DIAGNOSIS — I69812 Visuospatial deficit and spatial neglect following other cerebrovascular disease: Secondary | ICD-10-CM | POA: Diagnosis not present

## 2022-01-31 DIAGNOSIS — R293 Abnormal posture: Secondary | ICD-10-CM | POA: Diagnosis not present

## 2022-01-31 DIAGNOSIS — E871 Hypo-osmolality and hyponatremia: Secondary | ICD-10-CM | POA: Diagnosis not present

## 2022-01-31 DIAGNOSIS — I69354 Hemiplegia and hemiparesis following cerebral infarction affecting left non-dominant side: Secondary | ICD-10-CM | POA: Diagnosis not present

## 2022-01-31 DIAGNOSIS — M6389 Disorders of muscle in diseases classified elsewhere, multiple sites: Secondary | ICD-10-CM | POA: Diagnosis not present

## 2022-01-31 DIAGNOSIS — H538 Other visual disturbances: Secondary | ICD-10-CM | POA: Diagnosis not present

## 2022-02-01 DIAGNOSIS — R293 Abnormal posture: Secondary | ICD-10-CM | POA: Diagnosis not present

## 2022-02-01 DIAGNOSIS — R41841 Cognitive communication deficit: Secondary | ICD-10-CM | POA: Diagnosis not present

## 2022-02-01 DIAGNOSIS — I69391 Dysphagia following cerebral infarction: Secondary | ICD-10-CM | POA: Diagnosis not present

## 2022-02-01 DIAGNOSIS — R278 Other lack of coordination: Secondary | ICD-10-CM | POA: Diagnosis not present

## 2022-02-01 DIAGNOSIS — R41840 Attention and concentration deficit: Secondary | ICD-10-CM | POA: Diagnosis not present

## 2022-02-01 DIAGNOSIS — M6389 Disorders of muscle in diseases classified elsewhere, multiple sites: Secondary | ICD-10-CM | POA: Diagnosis not present

## 2022-02-01 DIAGNOSIS — I69812 Visuospatial deficit and spatial neglect following other cerebrovascular disease: Secondary | ICD-10-CM | POA: Diagnosis not present

## 2022-02-01 DIAGNOSIS — S065X3S Traumatic subdural hemorrhage with loss of consciousness of 1 hour to 5 hours 59 minutes, sequela: Secondary | ICD-10-CM | POA: Diagnosis not present

## 2022-02-01 DIAGNOSIS — I69354 Hemiplegia and hemiparesis following cerebral infarction affecting left non-dominant side: Secondary | ICD-10-CM | POA: Diagnosis not present

## 2022-02-01 DIAGNOSIS — G4089 Other seizures: Secondary | ICD-10-CM | POA: Diagnosis not present

## 2022-02-01 DIAGNOSIS — H538 Other visual disturbances: Secondary | ICD-10-CM | POA: Diagnosis not present

## 2022-02-01 DIAGNOSIS — I5022 Chronic systolic (congestive) heart failure: Secondary | ICD-10-CM | POA: Diagnosis not present

## 2022-02-02 ENCOUNTER — Ambulatory Visit (INDEPENDENT_AMBULATORY_CARE_PROVIDER_SITE_OTHER): Payer: Medicare Other | Admitting: Internal Medicine

## 2022-02-02 ENCOUNTER — Other Ambulatory Visit: Payer: Self-pay

## 2022-02-02 VITALS — BP 118/82 | HR 77 | Temp 97.3°F

## 2022-02-02 DIAGNOSIS — I69354 Hemiplegia and hemiparesis following cerebral infarction affecting left non-dominant side: Secondary | ICD-10-CM | POA: Diagnosis not present

## 2022-02-02 DIAGNOSIS — I339 Acute and subacute endocarditis, unspecified: Secondary | ICD-10-CM | POA: Diagnosis not present

## 2022-02-02 DIAGNOSIS — I69391 Dysphagia following cerebral infarction: Secondary | ICD-10-CM | POA: Diagnosis not present

## 2022-02-02 DIAGNOSIS — R41841 Cognitive communication deficit: Secondary | ICD-10-CM | POA: Diagnosis not present

## 2022-02-02 DIAGNOSIS — G4089 Other seizures: Secondary | ICD-10-CM | POA: Diagnosis not present

## 2022-02-02 NOTE — Progress Notes (Unsigned)
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) 11/23/2021   Sleep apnea treated with nocturnal BiPAP 11/23/2021   Seizure ([place]) 08/10/2021   AKI (acute kidney injury) ([place]) 84/69/6295   Chronic systolic CHF (congestive heart failure) ([place]) 08/10/2021   Spastic hemiparesis of left nondominant side ([place]) 07/14/2021   Sleep disorder 06/02/2021   Prolonged Q-T interval on ECG 04/30/2021   Secondary hypercoagulable state ([place]) 02/01/2021   Tremor of unknown origin 01/18/2021   Prosthetic valve endocarditis ([place])    Protein-calorie malnutrition, severe 12/18/2020   S/P MVR (mitral valve repair)    History of CVA in adulthood    History of aortic valve replacement    History of mitral valve replacement    Pressure injury of skin 12/14/2020   Hypernatremia 12/14/2020   Somnolence    Diabetes mellitus type II, controlled ([place]) 10/15/2020   Oropharyngeal dysphagia    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[place])    Acute ischemic right MCA stroke ([place]) 08/15/2020   CVA (cerebral vascular accident) ([place]) 08/07/2020   Middle cerebral artery embolism, 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   Subdural hemorrhage following injury ([place]) 07/31/2020   S/P craniotomy 07/28/2020   Subdural hematoma ([place]) 07/27/2020   Vertigo 08/30/2018   S/P right THA, 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   History of food allergy 12/11/2017   Emesis 12/11/2017   Visit for monitoring Tikosyn therapy 06/06/2017   Internal hemorrhoid, bleeding 05/19/2016   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   Aortic valve disorder 07/09/2015   Long term current use of anticoagulant therapy 01/19/2015   Atrial fibrillation, chronic ([place]) 01/20/2014   Elevated PSA 12/23/2013   Prediabetes 12/19/2013   Benign prostatic hyperplasia with urinary obstruction 10/29/2013   left frontal cerebral infarct  secondary to afib 08/05/2013   Atrial flutter ([place]) 05/17/2013   Essential hypertension 06/19/2012   Routine health maintenance 04/26/2011   Persistent atrial fibrillation ([place]) 07/10/2008   Hyperlipidemia with target LDL less than 130 07/07/2008   History of colonic polyps 06/16/2008   ERECTILE DYSFUNCTION 09/25/2007   PAF (paroxysmal atrial fibrillation) ([place]) 09/24/2007    Patient's Medications  New Prescriptions   No medications on file  Previous Medications   ACETAMINOPHEN (TYLENOL) 325 MG TABLET    Take 650 mg by mouth as needed for mild pain.   AMANTADINE HCL 100 MG TABLET    Take 100 mg by mouth 2 (two) times daily.   AMLODIPINE (NORVASC) 5 MG TABLET    Take 0.5 tablets (2.5 mg total) by mouth daily.   AMOXICILLIN (AMOXIL) 500 MG CAPSULE    Take 500 mg by mouth 2 (two) times daily.   APIXABAN (ELIQUIS) 5 MG TABS TABLET    Take 1 tablet (5 mg total) by mouth 2 (two) times daily.   ARTIFICIAL SALIVA (BIOTENE MOISTURIZING MOUTH MT)    Use as directed 2 sprays in the mouth or throat in the morning, at noon, and at bedtime.   ATORVASTATIN (LIPITOR) 80 MG TABLET    Take 1 tablet (80 mg total) by mouth daily.   BISACODYL (DULCOLAX) 10 MG SUPPOSITORY    Place 1 suppository (10 mg total) rectally daily as needed for moderate constipation or mild constipation.   CLOBETASOL (TEMOVATE) 0.05 % EXTERNAL SOLUTION    Apply 1 application  topically as needed (itching).  DIAZEPAM, 15 MG DOSE, (VALTOCO 15 MG DOSE) 2 X 7.5 MG/0.1ML LQPK    Place 7.5 mg into each nostril (total of 15 mg) for grand mal seizure lasting over 5 minutes. Can repeat once in 4 hours. Do Not use more than twice in 24 hours   DOFETILIDE (TIKOSYN) 125 MCG CAPSULE    Take 3 capsules (375 mcg total) by mouth 2 (two) times daily.   FAMOTIDINE (PEPCID) 20 MG TABLET    Take 20 mg by mouth at bedtime as needed for indigestion.   FINASTERIDE (PROSCAR) 5 MG TABLET    Take 1 tablet (5 mg total) by mouth daily.   GLUCERNA (GLUCERNA)  LIQD    Take 237 mLs by mouth daily.   HYDROCORTISONE 2.5 % CREAM    Apply 1 application. topically daily as needed (itching).   LACOSAMIDE (VIMPAT) 10 MG/ML ORAL SOLUTION    Take 7.5 mLs (75 mg total) by mouth at bedtime.   LACOSAMIDE (VIMPAT) 10 MG/ML ORAL SOLUTION    Take 5 mLs (50 mg total) by mouth in the morning.   LEVALBUTEROL (XOPENEX) 0.63 MG/3ML NEBULIZER SOLUTION    Take 0.63 mg by nebulization every 6 (six) hours as needed for wheezing or shortness of breath.   MAGNESIUM GLUCONATE (MAGONATE) 500 MG TABLET    Take 1 tablet (500 mg total) by mouth daily.   MELATONIN 5 MG TABS    Take 5 mg by mouth at bedtime.   METHENAMINE (MANDELAMINE) 1 G TABLET    Take 1,000 mg by mouth 2 (two) times daily.   METHYLPHENIDATE (RITALIN) 5 MG TABLET    Take 1 tablet (5 mg total) by mouth 2 (two) times daily. Take at 7 am and 12 pm   MULTIPLE VITAMIN (MULTIVITAMIN WITH MINERALS) TABS TABLET    Take 1 tablet by mouth daily.   OMEPRAZOLE (PRILOSEC) 20 MG CAPSULE    Take 20 mg by mouth daily.   POLYETHYL GLYCOL-PROPYL GLYCOL (SYSTANE) 0.4-0.3 % SOLN    Place 2 drops into both eyes daily.   POLYETHYLENE GLYCOL (MIRALAX / GLYCOLAX) 17 G PACKET    Take 17 g by mouth every other day.   POTASSIUM CHLORIDE (KLOR-CON) 10 MEQ TABLET    Take 2 tablets (20 mEq total) by mouth daily.   SENNA-DOCUSATE (SENOKOT-S) 8.6-50 MG TABLET    Take 1 tablet by mouth at bedtime as needed.   TORSEMIDE 40 MG TABS    Take 40 mg by mouth daily.  Modified Medications   No medications on file  Discontinued Medications   No medications on file    Subjective: [name] [name] is a 81 y.o. 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 04/19/19 he  presents with his wife. [facility] Wife reports that he is able to tolerate the amoxicillin. She believes if pt's PO intake does not improve, peg tube may be the next step. Interim: Seen by [facility] on 1/25, noted to have vegetation still present on mitral valve. Blood Cx NG on 1/25. No plan to change management. 05/11/21: Wife states pt is still not interacting very much. Other wise no other complaints.   Interim: Seen by Dr. MAundra DubinCardiology on 4/25. Plan to follow-up in 6 months 07/12/21:  Wife present at visit. She reports pt is more sleepy than usual. He has a sleep study scheduled.   Today 02/02/22: Presents with wife. More interactive today.  Review of Systems: Review of Systems  All other systems reviewed and are negative.   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    Other and unspecified hyperlipidemia    Pre-diabetes    Previous back surgery 1978, jan 2007   Psychosexual dysfunction with inhibited sexual excitement    SOB (shortness of breath)    "with heavy exercise"   Stroke ([place]) 08/2013   "I WAS IN AFIB AND THREW A CLOT, THE EFFECTS WERE TRANSITORY AND DIDNT LAST BUT FOR 30 MINUTES"    Thoracic aortic aneurysm ([facility])     Social History   Tobacco Use   Smoking status: Never   Smokeless tobacco: Never  Vaping Use   Vaping Use: Never used  Substance Use Topics   Alcohol use: Never    Comment: quit 5/22   Drug use: Never    Family History  Problem Relation Age of Onset    Macular degeneration Mother        macular degeneration   Cancer Father        intestinal/GI   Diabetes Paternal Aunt    Heart attack Maternal Grandfather    Diabetes Brother     Allergies  Allergen Reactions   Naproxen Hives   No [facility] Food Allergies Other (See Comments)    Scallops - distress, nausea and vomitting    Health Maintenance  Topic Date Due   Diabetic kidney evaluation - Urine ACR  Never done   Medicare Annual Wellness (AWV)  07/10/2020   COVID-19 Vaccine (4 - 2023-24 season) 11/05/2021   HEMOGLOBIN A1C  01/05/2022   OPHTHALMOLOGY EXAM  10/06/2022 (Originally 10/04/1950)   FOOT EXAM  10/23/2022   Diabetic kidney evaluation - GFR measurement  01/21/2023   Pneumonia Vaccine 75+ Years old  Completed   INFLUENZA VACCINE  Completed   Zoster Vaccines- Shingrix  Completed   HPV VACCINES  Aged Out   COLONOSCOPY (Pts 45-8yr Insurance coverage will need to be confirmed)  Discontinued   Hepatitis C Screening  Discontinued    Objective:  Vitals:   02/02/22 1114  BP: 118/82  Pulse: 77  Temp: (!) 97.3 F (36.3 C)  TempSrc: Temporal  SpO2: 98%   There is no height or weight on file to calculate BMI.  Physical Exam Constitutional:      General: He is not in acute distress.    Appearance: He is normal weight. He is not toxic-appearing.  HENT:     Head: Normocephalic and atraumatic.     Right Ear: External ear normal.     Left Ear: External ear normal.     Nose: No congestion or rhinorrhea.     Mouth/Throat:     Mouth: Mucous membranes  are moist.     Pharynx: Oropharynx is clear.  Eyes:     Extraocular Movements: Extraocular movements intact.     Conjunctiva/sclera: Conjunctivae normal.     Pupils: Pupils are equal, round, and reactive to light.  Cardiovascular:     Rate and Rhythm: Normal rate and regular rhythm.     Heart sounds: No murmur heard.    No friction rub. No gallop.  Pulmonary:     Effort: Pulmonary effort is normal.     Breath  sounds: Normal breath sounds.  Abdominal:     General: Abdomen is flat. Bowel sounds are normal.     Palpations: Abdomen is soft.  Musculoskeletal:        General: No swelling. Normal range of motion.     Cervical back: Normal range of motion and neck supple.  Skin:    General: Skin is warm and dry.  Neurological:     General: No focal deficit present.     Mental Status: He is oriented to person, place, and time.  Psychiatric:        Mood and Affect: Mood normal.     Lab Results Lab Results  Component Value Date   WBC 10.2 01/20/2022   HGB 9.5 (A) 01/20/2022   HCT 30 (A) 01/20/2022   MCV 94.3 08/12/2021   PLT 253 01/20/2022    Lab Results  Component Value Date   CREATININE 0.9 01/20/2022   BUN 28 (A) 01/20/2022   NA 143 01/20/2022   K 3.5 01/20/2022   CL 104 01/20/2022   CO2 27 (A) 01/20/2022    Lab Results  Component Value Date   ALT 20 01/03/2022   AST 25 01/03/2022   ALKPHOS 100 01/03/2022   BILITOT 1.1 01/03/2022    Lab Results  Component Value Date   CHOL 110 07/05/2021   HDL 43 07/05/2021   LDLCALC 57 07/05/2021   TRIG 51 07/05/2021   CHOLHDL 2.8 08/08/2020   No results found for: "LABRPR", "RPRTITER" No results found for: "HIV1RNAQUANT", "HIV1RNAVL", "CD4TABS"   A/P #E Faecalis bacteremia with prosthetic mitral valve endocarditis -SP 6 weeks of ampicillin and ceftriaxone End date 01/29/21 -On suppressive amoxicillin. 02/24/21, 03/31/21, 05/11/21 surveillance blood Cx negative. vegetation noted on TTE on 1/25. Vegetation likely represent sterile mass. -Seen by Cards Dr. [name] on 10/30,noted that echo done on day of visit showed vegetation and thickening of MV  with thickening of AoV, no definite mobile vegetation, concern for indolent endocarditis. 10/30 BCx NG. Pt is a poor candidate for perc valve replacement.  Plan -labs today cbc, cmp, esr, crp blood cx(wife requested call with normal results as well).  -Continue amoxicillin 56m PO bid as labs  have been stable and blood Cx negative. Discussed clal clnic and go to ED if develop worsening findings, ie fever, CP.  -Follow-up in 6 months      MLaurice Record MD RGreenbrierfor Infectious Disease CO[facility] 02/02/2022, 11:32 AM

## 2022-02-03 ENCOUNTER — Encounter: Payer: Self-pay | Admitting: Adult Health

## 2022-02-03 ENCOUNTER — Non-Acute Institutional Stay (SKILLED_NURSING_FACILITY): Payer: Medicare Other | Admitting: Adult Health

## 2022-02-03 DIAGNOSIS — R41840 Attention and concentration deficit: Secondary | ICD-10-CM | POA: Diagnosis not present

## 2022-02-03 DIAGNOSIS — I69354 Hemiplegia and hemiparesis following cerebral infarction affecting left non-dominant side: Secondary | ICD-10-CM | POA: Diagnosis not present

## 2022-02-03 DIAGNOSIS — S065X3S Traumatic subdural hemorrhage with loss of consciousness of 1 hour to 5 hours 59 minutes, sequela: Secondary | ICD-10-CM | POA: Diagnosis not present

## 2022-02-03 DIAGNOSIS — H538 Other visual disturbances: Secondary | ICD-10-CM | POA: Diagnosis not present

## 2022-02-03 DIAGNOSIS — R278 Other lack of coordination: Secondary | ICD-10-CM | POA: Diagnosis not present

## 2022-02-03 DIAGNOSIS — I69812 Visuospatial deficit and spatial neglect following other cerebrovascular disease: Secondary | ICD-10-CM | POA: Diagnosis not present

## 2022-02-03 DIAGNOSIS — R569 Unspecified convulsions: Secondary | ICD-10-CM

## 2022-02-03 DIAGNOSIS — T826XXD Infection and inflammatory reaction due to cardiac valve prosthesis, subsequent encounter: Secondary | ICD-10-CM

## 2022-02-03 DIAGNOSIS — I69391 Dysphagia following cerebral infarction: Secondary | ICD-10-CM | POA: Diagnosis not present

## 2022-02-03 DIAGNOSIS — G4089 Other seizures: Secondary | ICD-10-CM | POA: Diagnosis not present

## 2022-02-03 DIAGNOSIS — I5022 Chronic systolic (congestive) heart failure: Secondary | ICD-10-CM

## 2022-02-03 DIAGNOSIS — I38 Endocarditis, valve unspecified: Secondary | ICD-10-CM | POA: Diagnosis not present

## 2022-02-03 DIAGNOSIS — R41841 Cognitive communication deficit: Secondary | ICD-10-CM | POA: Diagnosis not present

## 2022-02-03 DIAGNOSIS — M6389 Disorders of muscle in diseases classified elsewhere, multiple sites: Secondary | ICD-10-CM | POA: Diagnosis not present

## 2022-02-03 DIAGNOSIS — R293 Abnormal posture: Secondary | ICD-10-CM | POA: Diagnosis not present

## 2022-02-03 NOTE — Progress Notes (Signed)
Location:  Occupational psychologist of Service:  SNF (31) Provider:   Cindi Carbon, [place] [phone]   [name], MD  Patient Care Team: [name], MD as PCP - General (Internal Medicine) Larey Dresser, MD as PCP - Cardiology (Cardiology) [name], MD as Consulting Physician (Ophthalmology) [name], MD (Internal Medicine)  Extended Emergency Contact Information Primary Emergency Contact: [name] Address: [id number]          [place], [place] [address] [place] Phone: [phone] Mobile Phone: [phone] Relation: Spouse Secondary Emergency Contact: [name], [place] [address] [name] of [place] Phone: [phone] Relation: Son  Code Status:  DNR Goals of care: Advanced Directive information    12/27/2021   10:36 AM  Advanced Directives  Does Patient Have a Medical Advance Directive? Yes  Type of Paramedic of [place];Living will  Does patient want to make changes to medical advance directive? No - Patient declined  Copy of [place] in Chart? Yes - validated most recent copy scanned in chart (See row information)     Chief Complaint  Patient presents with   Acute Visit    Discuss concerns regarding vimpat    HPI:  Pt is a 81 y.o. male seen today for an acute visit to discuss concerns regarding vimpat.   PMH includes syncope with fall in May of 2022, TBI with SDH with craniotomy, acute right MCA s/p thrombectomy, bioprosthetic mitral and aortic valve replacement, DM II, diastiolic CHF, BPH, and afib.  On life long amoxicillin for endocarditis   He was noted to have another seizure on 11/18 and dried blood was found around his mouth from biting his tongue. He has had several seizures involving shaking,looking to the left, eyes rolling. Dr Lyndel Safe recommended vimpat dose to be increased.  His wife declined as she wants to avoid lethargy so he will eat and drink.   Weights are variable currently 139 down 3 lbs from 10 days ago. NA 149 on 01/31/22.  Repeat 145 02/02/22 at ID office. He was going over 150 and did not seem to be able to tolerate torsemide doses higher than 40 mg daily. Getting lethargy with high sodium. He continues to have periods of apnea and rapid breathing. Not currently on oxygen but has off/on used 2 liters for hypoxia. Sats 98% today on RA.  Wt Readings from Last 3 Encounters:  02/03/22 139 lb 9.6 oz (63.3 kg)  01/24/22 142 lb 9.6 oz (64.7 kg)  01/13/22 136 lb 12.8 oz (62.1 kg)   Seen 01/03/22 by Dr Deatra Robinson. BNP 204.1.  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  Recommendation made by cardiology to increase torsemide and maintain unless NA >150 and also to consider hospice due to valvular stenosis/not a surgical candidate. Also concern for indolent indocarditis. Blood cx neg 10/30 x 1. Repeat blood culltures at Dr. Candiss Norse office 02/01/22 pending neg thus far.   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    Other and unspecified hyperlipidemia    Pre-diabetes    Previous back surgery 1978, jan 2007   Psychosexual dysfunction with inhibited  sexual excitement    SOB (shortness of breath)    "with heavy exercise"   Stroke ([place]) 08/2013   "I WAS IN AFIB AND THREW A CLOT, THE EFFECTS WERE TRANSITORY AND DIDNT LAST BUT FOR 30 MINUTES"    Thoracic aortic aneurysm [facility])    Past Surgical History:  Procedure Laterality Date   AORTIC AND MITRAL VALVE REPLACEMENT     09/2004   CARDIOVERSION     3 times from 2004-2006   CARDIOVERSION N/A 09/26/2013   Procedure: CARDIOVERSION;  Surgeon: Larey Dresser, MD;  Location: [name];  Service: Cardiovascular;  Laterality: N/A;   CARDIOVERSION N/A 06/19/2014   Procedure: CARDIOVERSION;  Surgeon: Jerline Pain, MD;  Location: [name];  Service: Cardiovascular;  Laterality: N/A;   CARDIOVERSION N/A 04/24/2017   Procedure: CARDIOVERSION;  Surgeon: Larey Dresser, MD;  Location: [place];  Service: Cardiovascular;  Laterality: N/A;   CARDIOVERSION N/A 06/08/2017   Procedure: CARDIOVERSION;  Surgeon: Pixie Casino, MD;  Location: [facility] ENDOSCOPY;  Service: Cardiovascular;  Laterality: N/A;   CARDIOVERSION N/A 08/24/2017   Procedure: CARDIOVERSION;  Surgeon: Skeet Latch, MD;  Location: [facility] ENDOSCOPY;  Service: Cardiovascular;  Laterality: N/A;   CARDIOVERSION N/A 02/14/2018   Procedure: CARDIOVERSION;  Surgeon: Larey Dresser, MD;  Location: [facility] ENDOSCOPY;  Service: Cardiovascular;  Laterality: N/A;   COLONOSCOPY     CRANIOTOMY N/A 07/28/2020   Procedure: CRANIOTOMY FOR EVACUATION OF SUBDURAL HEMATOMA;  Surgeon: Eustace Pafford, MD;  Location: [place];  Service: Neurosurgery;  Laterality: N/A;   IR ANGIO VERTEBRAL SEL SUBCLAVIAN INNOMINATE UNI R MOD SED  08/07/2020   IR CT HEAD LTD  08/07/2020   IR PERCUTANEOUS ART THROMBECTOMY/INFUSION INTRACRANIAL INC DIAG ANGIO  08/07/2020   laminectomies     10/1975 and in 03/2005   RADIOLOGY WITH ANESTHESIA N/A 08/07/2020   Procedure: IR WITH ANESTHESIA;  Surgeon: Luanne Bras, MD;  Location: [place];  Service: Radiology;  Laterality: N/A;   TEE  WITHOUT CARDIOVERSION N/A 12/18/2020   Procedure: TRANSESOPHAGEAL ECHOCARDIOGRAM (TEE);  Surgeon: Larey Dresser, MD;  Location: [facility] ENDOSCOPY;  Service: Cardiovascular;  Laterality: N/A;   Robinson Right 04/03/2018   Procedure: TOTAL HIP ARTHROPLASTY ANTERIOR APPROACH;  Surgeon: Paralee Cancel, MD;  Location: [facility] ORS;  Service: Orthopedics;  Laterality: Right;  70 mins    Allergies  Allergen Reactions   Naproxen Hives   No [facility] Food Allergies Other (See Comments)    Scallops - distress, nausea and vomitting    Outpatient Encounter Medications as of 02/03/2022  Medication Sig   acetaminophen (TYLENOL) 325 MG tablet Take 650 mg by mouth as needed for mild pain.   Amantadine HCl 100 MG tablet Take 100 mg by mouth 2 (two) times daily.   amLODipine (NORVASC) 5 MG tablet Take 0.5 tablets (2.5 mg total) by mouth daily.   amoxicillin (AMOXIL) 500  MG capsule Take 500 mg by mouth 2 (two) times daily.   apixaban (ELIQUIS) 5 MG TABS tablet Take 1 tablet (5 mg total) by mouth 2 (two) times daily.   Artificial Saliva (BIOTENE MOISTURIZING MOUTH MT) Use as directed 2 sprays in the mouth or throat in the morning, at noon, and at bedtime.   atorvastatin (LIPITOR) 80 MG tablet Take 1 tablet (80 mg total) by mouth daily.   bisacodyl (DULCOLAX) 10 MG suppository Place 1 suppository (10 mg total) rectally daily as needed for moderate constipation or mild constipation.   clobetasol (TEMOVATE) 0.05 % external solution Apply 1 application  topically as needed (itching).   diazePAM, 15 MG Dose, (VALTOCO 15 MG DOSE) 2 x 7.5 MG/0.1ML LQPK Place 7.5 mg into each nostril (total of 15 mg) for grand mal seizure lasting over 5 minutes. Can repeat once in 4 hours. Do Not use more than twice in 24 hours   dofetilide (TIKOSYN) 125 MCG capsule Take 3 capsules (375 mcg total) by mouth 2 (two) times daily.   famotidine (PEPCID) 20 MG tablet Take 20 mg by mouth at bedtime as needed  for indigestion.   finasteride (PROSCAR) 5 MG tablet Take 1 tablet (5 mg total) by mouth daily.   Glucerna (GLUCERNA) LIQD Take 237 mLs by mouth daily.   hydrocortisone 2.5 % cream Apply 1 application. topically daily as needed (itching).   lacosamide (VIMPAT) 10 MG/ML oral solution Take 7.5 mLs (75 mg total) by mouth at bedtime.   lacosamide (VIMPAT) 10 MG/ML oral solution Take 5 mLs (50 mg total) by mouth in the morning.   levalbuterol (XOPENEX) 0.63 MG/3ML nebulizer solution Take 0.63 mg by nebulization every 6 (six) hours as needed for wheezing or shortness of breath.   magnesium gluconate (MAGONATE) 500 MG tablet Take 1 tablet (500 mg total) by mouth daily.   melatonin 5 MG TABS Take 5 mg by mouth at bedtime.   methenamine (MANDELAMINE) 1 g tablet Take 1,000 mg by mouth 2 (two) times daily.   methylphenidate (RITALIN) 5 MG tablet Take 1 tablet (5 mg total) by mouth 2 (two) times daily. Take at 7 am and 12 pm   Multiple Vitamin (MULTIVITAMIN WITH MINERALS) TABS tablet Take 1 tablet by mouth daily.   omeprazole (PRILOSEC) 20 MG capsule Take 20 mg by mouth daily.   Polyethyl Glycol-Propyl Glycol (SYSTANE) 0.4-0.3 % SOLN Place 2 drops into both eyes daily.   polyethylene glycol (MIRALAX / GLYCOLAX) 17 g packet Take 17 g by mouth every other day.   potassium chloride (KLOR-CON) 10 MEQ tablet Take 2 tablets (20 mEq total) by mouth daily.   senna-docusate (SENOKOT-S) 8.6-50 MG tablet Take 1 tablet by mouth at bedtime as needed.   Torsemide 40 MG TABS Take 40 mg by mouth daily.   [DISCONTINUED] sodium chloride (PF) 0.9 % injection 4 mL    No facility-administered encounter medications on file as of 02/03/2022.    Review of Systems  Unable to perform ROS: Mental status change    Immunization History  Administered Date(s) Administered   Fluad Quad(high Dose 65+) 12/06/2018, 12/09/2020, 12/10/2021   Influenza Whole 12/25/2008, 01/05/2010, 12/30/2011   Influenza, High Dose Seasonal PF  12/14/2016   Influenza,inj,Quad PF,6+ Mos 12/19/2013   Influenza,inj,quad, With Preservative 11/22/2017   Influenza-Unspecified 12/14/2015, 12/24/2019, 12/10/2021   Moderna Covid-19 Vaccine Bivalent Booster 49yr & up 01/12/2021   Moderna SARS-COV2 Booster Vaccination 01/02/2020, 06/26/2020   Moderna Sars-Covid-2 Vaccination 04/08/2019, 05/05/2019   Pneumococcal Conjugate-13 12/19/2013  Pneumococcal Polysaccharide-23 09/24/2007, 05/22/2017   Td 12/25/2008   Td (Adult), 2 Lf Tetanus Toxid, Preservative Free 12/25/2008   Tdap 03/22/2018   Zoster Recombinat (Shingrix) 07/16/2019, 10/28/2019   Zoster, Live 09/24/2007   Pertinent  Health Maintenance Due  Topic Date Due   HEMOGLOBIN A1C  01/05/2022   OPHTHALMOLOGY EXAM  10/06/2022 (Originally 10/04/1950)   FOOT EXAM  10/23/2022   INFLUENZA VACCINE  Completed   COLONOSCOPY (Pts 45-43yr Insurance coverage will need to be confirmed)  Discontinued      08/11/2021    8:00 AM 08/11/2021    7:30 PM 08/12/2021    8:00 AM 10/13/2021    2:32 PM 02/02/2022   11:24 AM  Fall Risk  Falls in the past year?    0 0  Was there an injury with Fall?     0  Fall Risk Category Calculator     0  Fall Risk Category     Low  Patient Fall Risk Level High fall risk High fall risk High fall risk  High fall risk  Patient at Risk for Falls Due to     Impaired mobility  Fall risk Follow up     Falls evaluation completed   Functional Status Survey:    Vitals:   02/03/22 1517  BP: 118/74  Pulse: 73  Resp: 20  Temp: (!) 97.2 F (36.2 C)  SpO2: 98%  Weight: 139 lb 9.6 oz (63.3 kg)   Body mass index is 21.86 kg/m. Physical Exam Vitals and nursing note reviewed.  Constitutional:      General: He is not in acute distress.    Appearance: He is ill-appearing. He is not diaphoretic.  HENT:     Head: Normocephalic and atraumatic.  Neck:     Thyroid: No thyromegaly.     Vascular: No JVD.     Trachea: No tracheal deviation.  Cardiovascular:     Rate and  Rhythm: Normal rate and regular rhythm.     Heart sounds: No murmur heard. Pulmonary:     Effort: Pulmonary effort is normal. No respiratory distress.     Breath sounds: No wheezing.     Comments: Decreased bases, increased wob.  Abdominal:     General: Bowel sounds are normal. There is distension (mild).     Palpations: Abdomen is soft.     Tenderness: There is no abdominal tenderness.  Musculoskeletal:     Right lower leg: No edema.     Left lower leg: No edema.  Lymphadenopathy:     Cervical: No cervical adenopathy.  Skin:    General: Skin is warm and dry.  Neurological:     Mental Status: He is alert.     Comments: Left sided hemiplegia and neglect.      Labs reviewed: Recent Labs    08/11/21 0408 08/12/21 0107 08/16/21 0000 12/13/21 1157 12/14/21 0000 01/03/22 1322 01/10/22 0000 01/13/22 0000 01/20/22 0000 02/02/22 1202  NA 140 142   < >  --    < > 144   < > 147 143 145  K 3.6 3.9   < >  --    < > 3.7   < > 3.6 3.5 3.6  CL 109 113*   < >  --    < > 108   < > 106 104 107  CO2 24 23   < >  --    < > 25   < > 27* 27* 28  GLUCOSE 120* 143*  --   --   --  120*  --   --   --  126*  BUN 17 17   < >  --    < > 27*   < > 37* 28* 32*  CREATININE 0.81 0.83   < >  --    < > 1.18   < > 1.2 0.9 1.14  CALCIUM 8.6* 8.7*   < >  --    < > 9.1   < > 9.6 9.7 9.2  MG 1.9 2.0  --  2.4  --   --   --   --   --   --    < > = values in this interval not displayed.   Recent Labs    08/11/21 0408 09/07/21 0000 09/08/21 0000 01/03/22 1322 02/02/22 1202  AST '27 24 24 25 22  '$ ALT '23 18 18 20 23  '$ ALKPHOS 80 105 105 100  --   BILITOT 0.8  --   --  1.1 0.9  PROT 5.8*  --   --  7.4 7.1  ALBUMIN 3.1* 3.5 3.5 3.6  --    Recent Labs    08/10/21 0937 08/10/21 0957 08/11/21 0408 08/12/21 0107 09/07/21 0000 12/06/21 0000 01/20/22 0000 02/02/22 1202  WBC 16.1*  --  9.1 10.2   < > 8.1 10.2 9.4  NEUTROABS 14.3*  --   --  7.8*  --   --   --  6,110  HGB 10.0*   < > 9.4* 9.4*   < > 9.2*  9.5* 9.0*  HCT 31.6*   < > 28.7* 29.8*   < > 28* 30* 28.3*  MCV 96.3  --  92.0 94.3  --   --   --  86.0  PLT 238  --  218 227   < > 338 253 261   < > = values in this interval not displayed.   Lab Results  Component Value Date   TSH 1.773 01/24/2021   Lab Results  Component Value Date   HGBA1C 5.7 07/05/2021   Lab Results  Component Value Date   CHOL 110 07/05/2021   HDL 43 07/05/2021   LDLCALC 57 07/05/2021   TRIG 51 07/05/2021   CHOLHDL 2.8 08/08/2020    Significant Diagnostic Results in last 30 days:  No results found.  Assessment/Plan 1. Seizures ([place]) I discussed with his wife that seizures can lead to injuries and further complications and should be treated since he has already bitten his tongue. She is concerned about increasing the vimpat due to lethargy. Given that he is progressively declining we discussed focusing on what is best for him and comfort measures. I let her know we really would like to increase the dose and let her know to reconsider and let us know.   2. Chronic systolic CHF (congestive heart failure) ([place]) He remains on 40 mg of torsemide daily . Higher doses led to NA >150.   Followed by cardiology   3. Prosthetic valve endocarditis, subsequent encounter Having worsening mitral and aortic valve stenosis not a candidate for repair per cardiology.  Remains on amoxicillin Repeat blood cx pending at the time of this note.   I also discussed with Herbert Pun his wife the concept of focusing on the patient and his wellbeing rather than the numbers, frequent labs, weights etc. He seems to be progressively weaker, less verbal, and with reduced QOL. Hospice was recommended but they are holding off at this time due to therapy needs and not wanting to  change his meds. Therapy has indicated they are reducing his sessions to three times weekly due to lack of progress.   Family/ staff Communication: Glenna Fellows ordered:  BMP weekly

## 2022-02-04 DIAGNOSIS — I69812 Visuospatial deficit and spatial neglect following other cerebrovascular disease: Secondary | ICD-10-CM | POA: Diagnosis not present

## 2022-02-04 DIAGNOSIS — R41840 Attention and concentration deficit: Secondary | ICD-10-CM | POA: Diagnosis not present

## 2022-02-04 DIAGNOSIS — H538 Other visual disturbances: Secondary | ICD-10-CM | POA: Diagnosis not present

## 2022-02-04 DIAGNOSIS — I69391 Dysphagia following cerebral infarction: Secondary | ICD-10-CM | POA: Diagnosis not present

## 2022-02-04 DIAGNOSIS — G4089 Other seizures: Secondary | ICD-10-CM | POA: Diagnosis not present

## 2022-02-04 DIAGNOSIS — R41841 Cognitive communication deficit: Secondary | ICD-10-CM | POA: Diagnosis not present

## 2022-02-04 DIAGNOSIS — R278 Other lack of coordination: Secondary | ICD-10-CM | POA: Diagnosis not present

## 2022-02-04 DIAGNOSIS — S065X3S Traumatic subdural hemorrhage with loss of consciousness of 1 hour to 5 hours 59 minutes, sequela: Secondary | ICD-10-CM | POA: Diagnosis not present

## 2022-02-04 DIAGNOSIS — M6389 Disorders of muscle in diseases classified elsewhere, multiple sites: Secondary | ICD-10-CM | POA: Diagnosis not present

## 2022-02-04 DIAGNOSIS — R293 Abnormal posture: Secondary | ICD-10-CM | POA: Diagnosis not present

## 2022-02-04 DIAGNOSIS — I5022 Chronic systolic (congestive) heart failure: Secondary | ICD-10-CM | POA: Diagnosis not present

## 2022-02-04 DIAGNOSIS — I69354 Hemiplegia and hemiparesis following cerebral infarction affecting left non-dominant side: Secondary | ICD-10-CM | POA: Diagnosis not present

## 2022-02-07 DIAGNOSIS — I69391 Dysphagia following cerebral infarction: Secondary | ICD-10-CM | POA: Diagnosis not present

## 2022-02-07 DIAGNOSIS — I5022 Chronic systolic (congestive) heart failure: Secondary | ICD-10-CM | POA: Diagnosis not present

## 2022-02-07 DIAGNOSIS — M6389 Disorders of muscle in diseases classified elsewhere, multiple sites: Secondary | ICD-10-CM | POA: Diagnosis not present

## 2022-02-07 DIAGNOSIS — E871 Hypo-osmolality and hyponatremia: Secondary | ICD-10-CM | POA: Diagnosis not present

## 2022-02-07 DIAGNOSIS — R278 Other lack of coordination: Secondary | ICD-10-CM | POA: Diagnosis not present

## 2022-02-07 DIAGNOSIS — G4089 Other seizures: Secondary | ICD-10-CM | POA: Diagnosis not present

## 2022-02-07 DIAGNOSIS — R293 Abnormal posture: Secondary | ICD-10-CM | POA: Diagnosis not present

## 2022-02-07 DIAGNOSIS — I69812 Visuospatial deficit and spatial neglect following other cerebrovascular disease: Secondary | ICD-10-CM | POA: Diagnosis not present

## 2022-02-07 DIAGNOSIS — I69354 Hemiplegia and hemiparesis following cerebral infarction affecting left non-dominant side: Secondary | ICD-10-CM | POA: Diagnosis not present

## 2022-02-07 DIAGNOSIS — H538 Other visual disturbances: Secondary | ICD-10-CM | POA: Diagnosis not present

## 2022-02-07 DIAGNOSIS — S065X3S Traumatic subdural hemorrhage with loss of consciousness of 1 hour to 5 hours 59 minutes, sequela: Secondary | ICD-10-CM | POA: Diagnosis not present

## 2022-02-07 DIAGNOSIS — R41840 Attention and concentration deficit: Secondary | ICD-10-CM | POA: Diagnosis not present

## 2022-02-07 DIAGNOSIS — R41841 Cognitive communication deficit: Secondary | ICD-10-CM | POA: Diagnosis not present

## 2022-02-08 DIAGNOSIS — R41841 Cognitive communication deficit: Secondary | ICD-10-CM | POA: Diagnosis not present

## 2022-02-08 DIAGNOSIS — G4089 Other seizures: Secondary | ICD-10-CM | POA: Diagnosis not present

## 2022-02-08 DIAGNOSIS — I69354 Hemiplegia and hemiparesis following cerebral infarction affecting left non-dominant side: Secondary | ICD-10-CM | POA: Diagnosis not present

## 2022-02-08 DIAGNOSIS — I69391 Dysphagia following cerebral infarction: Secondary | ICD-10-CM | POA: Diagnosis not present

## 2022-02-08 LAB — CULTURE, BLOOD (SINGLE)
MICRO NUMBER:: 14247155
MICRO NUMBER:: 14247163
Result:: NO GROWTH
SPECIMEN QUALITY:: ADEQUATE

## 2022-02-08 LAB — CBC WITH DIFFERENTIAL/PLATELET
Absolute Monocytes: 602 cells/uL (ref 200–950)
Basophils Absolute: 66 cells/uL (ref 0–200)
Basophils Relative: 0.7 %
Eosinophils Absolute: 1363 cells/uL — ABNORMAL HIGH (ref 15–500)
Eosinophils Relative: 14.5 %
HCT: 28.3 % — ABNORMAL LOW (ref 38.5–50.0)
Hemoglobin: 9 g/dL — ABNORMAL LOW (ref 13.2–17.1)
Lymphs Abs: 1260 cells/uL (ref 850–3900)
MCH: 27.4 pg (ref 27.0–33.0)
MCHC: 31.8 g/dL — ABNORMAL LOW (ref 32.0–36.0)
MCV: 86 fL (ref 80.0–100.0)
MPV: 12 fL (ref 7.5–12.5)
Monocytes Relative: 6.4 %
Neutro Abs: 6110 cells/uL (ref 1500–7800)
Neutrophils Relative %: 65 %
Platelets: 261 10*3/uL (ref 140–400)
RBC: 3.29 10*6/uL — ABNORMAL LOW (ref 4.20–5.80)
RDW: 15.3 % — ABNORMAL HIGH (ref 11.0–15.0)
Total Lymphocyte: 13.4 %
WBC: 9.4 10*3/uL (ref 3.8–10.8)

## 2022-02-08 LAB — COMPREHENSIVE METABOLIC PANEL
AG Ratio: 1.3 (calc) (ref 1.0–2.5)
ALT: 23 U/L (ref 9–46)
AST: 22 U/L (ref 10–35)
Albumin: 4 g/dL (ref 3.6–5.1)
Alkaline phosphatase (APISO): 93 U/L (ref 35–144)
BUN/Creatinine Ratio: 28 (calc) — ABNORMAL HIGH (ref 6–22)
BUN: 32 mg/dL — ABNORMAL HIGH (ref 7–25)
CO2: 28 mmol/L (ref 20–32)
Calcium: 9.2 mg/dL (ref 8.6–10.3)
Chloride: 107 mmol/L (ref 98–110)
Creat: 1.14 mg/dL (ref 0.70–1.28)
Globulin: 3.1 g/dL (calc) (ref 1.9–3.7)
Glucose, Bld: 126 mg/dL — ABNORMAL HIGH (ref 65–99)
Potassium: 3.6 mmol/L (ref 3.5–5.3)
Sodium: 145 mmol/L (ref 135–146)
Total Bilirubin: 0.9 mg/dL (ref 0.2–1.2)
Total Protein: 7.1 g/dL (ref 6.1–8.1)

## 2022-02-08 LAB — C-REACTIVE PROTEIN: CRP: 1.8 mg/L (ref <8.0)

## 2022-02-08 LAB — SEDIMENTATION RATE: Sed Rate: 14 mm/h (ref 0–20)

## 2022-02-09 DIAGNOSIS — R293 Abnormal posture: Secondary | ICD-10-CM | POA: Diagnosis not present

## 2022-02-09 DIAGNOSIS — G4089 Other seizures: Secondary | ICD-10-CM | POA: Diagnosis not present

## 2022-02-09 DIAGNOSIS — I69354 Hemiplegia and hemiparesis following cerebral infarction affecting left non-dominant side: Secondary | ICD-10-CM | POA: Diagnosis not present

## 2022-02-09 DIAGNOSIS — I5022 Chronic systolic (congestive) heart failure: Secondary | ICD-10-CM | POA: Diagnosis not present

## 2022-02-09 DIAGNOSIS — R278 Other lack of coordination: Secondary | ICD-10-CM | POA: Diagnosis not present

## 2022-02-09 DIAGNOSIS — R41841 Cognitive communication deficit: Secondary | ICD-10-CM | POA: Diagnosis not present

## 2022-02-09 DIAGNOSIS — H538 Other visual disturbances: Secondary | ICD-10-CM | POA: Diagnosis not present

## 2022-02-09 DIAGNOSIS — I69391 Dysphagia following cerebral infarction: Secondary | ICD-10-CM | POA: Diagnosis not present

## 2022-02-09 DIAGNOSIS — M6389 Disorders of muscle in diseases classified elsewhere, multiple sites: Secondary | ICD-10-CM | POA: Diagnosis not present

## 2022-02-09 DIAGNOSIS — I69812 Visuospatial deficit and spatial neglect following other cerebrovascular disease: Secondary | ICD-10-CM | POA: Diagnosis not present

## 2022-02-09 DIAGNOSIS — S065X3S Traumatic subdural hemorrhage with loss of consciousness of 1 hour to 5 hours 59 minutes, sequela: Secondary | ICD-10-CM | POA: Diagnosis not present

## 2022-02-09 DIAGNOSIS — R41840 Attention and concentration deficit: Secondary | ICD-10-CM | POA: Diagnosis not present

## 2022-02-10 DIAGNOSIS — I5033 Acute on chronic diastolic (congestive) heart failure: Secondary | ICD-10-CM | POA: Diagnosis not present

## 2022-02-10 DIAGNOSIS — R41841 Cognitive communication deficit: Secondary | ICD-10-CM | POA: Diagnosis not present

## 2022-02-10 DIAGNOSIS — G4089 Other seizures: Secondary | ICD-10-CM | POA: Diagnosis not present

## 2022-02-10 DIAGNOSIS — I69391 Dysphagia following cerebral infarction: Secondary | ICD-10-CM | POA: Diagnosis not present

## 2022-02-10 DIAGNOSIS — I69354 Hemiplegia and hemiparesis following cerebral infarction affecting left non-dominant side: Secondary | ICD-10-CM | POA: Diagnosis not present

## 2022-02-11 DIAGNOSIS — R41841 Cognitive communication deficit: Secondary | ICD-10-CM | POA: Diagnosis not present

## 2022-02-11 DIAGNOSIS — G4089 Other seizures: Secondary | ICD-10-CM | POA: Diagnosis not present

## 2022-02-11 DIAGNOSIS — I69391 Dysphagia following cerebral infarction: Secondary | ICD-10-CM | POA: Diagnosis not present

## 2022-02-11 DIAGNOSIS — H538 Other visual disturbances: Secondary | ICD-10-CM | POA: Diagnosis not present

## 2022-02-11 DIAGNOSIS — I69354 Hemiplegia and hemiparesis following cerebral infarction affecting left non-dominant side: Secondary | ICD-10-CM | POA: Diagnosis not present

## 2022-02-11 DIAGNOSIS — R293 Abnormal posture: Secondary | ICD-10-CM | POA: Diagnosis not present

## 2022-02-11 DIAGNOSIS — M6389 Disorders of muscle in diseases classified elsewhere, multiple sites: Secondary | ICD-10-CM | POA: Diagnosis not present

## 2022-02-11 DIAGNOSIS — R278 Other lack of coordination: Secondary | ICD-10-CM | POA: Diagnosis not present

## 2022-02-11 DIAGNOSIS — S065X3S Traumatic subdural hemorrhage with loss of consciousness of 1 hour to 5 hours 59 minutes, sequela: Secondary | ICD-10-CM | POA: Diagnosis not present

## 2022-02-11 DIAGNOSIS — R41840 Attention and concentration deficit: Secondary | ICD-10-CM | POA: Diagnosis not present

## 2022-02-11 DIAGNOSIS — I5022 Chronic systolic (congestive) heart failure: Secondary | ICD-10-CM | POA: Diagnosis not present

## 2022-02-11 DIAGNOSIS — I69812 Visuospatial deficit and spatial neglect following other cerebrovascular disease: Secondary | ICD-10-CM | POA: Diagnosis not present

## 2022-02-14 DIAGNOSIS — M6389 Disorders of muscle in diseases classified elsewhere, multiple sites: Secondary | ICD-10-CM | POA: Diagnosis not present

## 2022-02-14 DIAGNOSIS — S065X3S Traumatic subdural hemorrhage with loss of consciousness of 1 hour to 5 hours 59 minutes, sequela: Secondary | ICD-10-CM | POA: Diagnosis not present

## 2022-02-14 DIAGNOSIS — I69391 Dysphagia following cerebral infarction: Secondary | ICD-10-CM | POA: Diagnosis not present

## 2022-02-14 DIAGNOSIS — I38 Endocarditis, valve unspecified: Secondary | ICD-10-CM | POA: Diagnosis not present

## 2022-02-14 DIAGNOSIS — G4089 Other seizures: Secondary | ICD-10-CM | POA: Diagnosis not present

## 2022-02-14 DIAGNOSIS — I5033 Acute on chronic diastolic (congestive) heart failure: Secondary | ICD-10-CM | POA: Diagnosis not present

## 2022-02-14 DIAGNOSIS — R41841 Cognitive communication deficit: Secondary | ICD-10-CM | POA: Diagnosis not present

## 2022-02-14 DIAGNOSIS — R41840 Attention and concentration deficit: Secondary | ICD-10-CM | POA: Diagnosis not present

## 2022-02-14 DIAGNOSIS — R278 Other lack of coordination: Secondary | ICD-10-CM | POA: Diagnosis not present

## 2022-02-14 DIAGNOSIS — I5022 Chronic systolic (congestive) heart failure: Secondary | ICD-10-CM | POA: Diagnosis not present

## 2022-02-14 DIAGNOSIS — I69812 Visuospatial deficit and spatial neglect following other cerebrovascular disease: Secondary | ICD-10-CM | POA: Diagnosis not present

## 2022-02-14 DIAGNOSIS — R0602 Shortness of breath: Secondary | ICD-10-CM | POA: Diagnosis not present

## 2022-02-14 DIAGNOSIS — H538 Other visual disturbances: Secondary | ICD-10-CM | POA: Diagnosis not present

## 2022-02-14 DIAGNOSIS — R293 Abnormal posture: Secondary | ICD-10-CM | POA: Diagnosis not present

## 2022-02-14 DIAGNOSIS — I69354 Hemiplegia and hemiparesis following cerebral infarction affecting left non-dominant side: Secondary | ICD-10-CM | POA: Diagnosis not present

## 2022-02-15 DIAGNOSIS — G4089 Other seizures: Secondary | ICD-10-CM | POA: Diagnosis not present

## 2022-02-15 DIAGNOSIS — I69391 Dysphagia following cerebral infarction: Secondary | ICD-10-CM | POA: Diagnosis not present

## 2022-02-15 DIAGNOSIS — R41841 Cognitive communication deficit: Secondary | ICD-10-CM | POA: Diagnosis not present

## 2022-02-15 DIAGNOSIS — I69354 Hemiplegia and hemiparesis following cerebral infarction affecting left non-dominant side: Secondary | ICD-10-CM | POA: Diagnosis not present

## 2022-02-16 DIAGNOSIS — R41841 Cognitive communication deficit: Secondary | ICD-10-CM | POA: Diagnosis not present

## 2022-02-16 DIAGNOSIS — G4089 Other seizures: Secondary | ICD-10-CM | POA: Diagnosis not present

## 2022-02-16 DIAGNOSIS — I69391 Dysphagia following cerebral infarction: Secondary | ICD-10-CM | POA: Diagnosis not present

## 2022-02-16 DIAGNOSIS — R41840 Attention and concentration deficit: Secondary | ICD-10-CM | POA: Diagnosis not present

## 2022-02-16 DIAGNOSIS — I69812 Visuospatial deficit and spatial neglect following other cerebrovascular disease: Secondary | ICD-10-CM | POA: Diagnosis not present

## 2022-02-16 DIAGNOSIS — R278 Other lack of coordination: Secondary | ICD-10-CM | POA: Diagnosis not present

## 2022-02-16 DIAGNOSIS — I5022 Chronic systolic (congestive) heart failure: Secondary | ICD-10-CM | POA: Diagnosis not present

## 2022-02-16 DIAGNOSIS — I69354 Hemiplegia and hemiparesis following cerebral infarction affecting left non-dominant side: Secondary | ICD-10-CM | POA: Diagnosis not present

## 2022-02-16 DIAGNOSIS — R293 Abnormal posture: Secondary | ICD-10-CM | POA: Diagnosis not present

## 2022-02-16 DIAGNOSIS — H538 Other visual disturbances: Secondary | ICD-10-CM | POA: Diagnosis not present

## 2022-02-16 DIAGNOSIS — S065X3S Traumatic subdural hemorrhage with loss of consciousness of 1 hour to 5 hours 59 minutes, sequela: Secondary | ICD-10-CM | POA: Diagnosis not present

## 2022-02-16 DIAGNOSIS — M6389 Disorders of muscle in diseases classified elsewhere, multiple sites: Secondary | ICD-10-CM | POA: Diagnosis not present

## 2022-02-17 ENCOUNTER — Ambulatory Visit (HOSPITAL_COMMUNITY)
Admission: RE | Admit: 2022-02-17 | Discharge: 2022-02-17 | Disposition: A | Discharge status: Home / Self Care | Payer: Medicare Other | Source: Ambulatory Visit | Attending: Cardiology | Admitting: Cardiology

## 2022-02-17 ENCOUNTER — Encounter (HOSPITAL_COMMUNITY): Payer: Self-pay | Admitting: Cardiology

## 2022-02-17 VITALS — BP 92/50 | HR 67

## 2022-02-17 DIAGNOSIS — B952 Enterococcus as the cause of diseases classified elsewhere: Secondary | ICD-10-CM | POA: Insufficient documentation

## 2022-02-17 DIAGNOSIS — Z7901 Long term (current) use of anticoagulants: Secondary | ICD-10-CM | POA: Insufficient documentation

## 2022-02-17 DIAGNOSIS — I7121 Aneurysm of the ascending aorta, without rupture: Secondary | ICD-10-CM | POA: Diagnosis not present

## 2022-02-17 DIAGNOSIS — R41841 Cognitive communication deficit: Secondary | ICD-10-CM | POA: Diagnosis not present

## 2022-02-17 DIAGNOSIS — R7881 Bacteremia: Secondary | ICD-10-CM | POA: Diagnosis not present

## 2022-02-17 DIAGNOSIS — I052 Rheumatic mitral stenosis with insufficiency: Secondary | ICD-10-CM | POA: Diagnosis not present

## 2022-02-17 DIAGNOSIS — I5032 Chronic diastolic (congestive) heart failure: Secondary | ICD-10-CM | POA: Diagnosis not present

## 2022-02-17 DIAGNOSIS — I69391 Dysphagia following cerebral infarction: Secondary | ICD-10-CM | POA: Diagnosis not present

## 2022-02-17 DIAGNOSIS — Z953 Presence of xenogenic heart valve: Secondary | ICD-10-CM | POA: Diagnosis not present

## 2022-02-17 DIAGNOSIS — I110 Hypertensive heart disease with heart failure: Secondary | ICD-10-CM | POA: Diagnosis not present

## 2022-02-17 DIAGNOSIS — Z79899 Other long term (current) drug therapy: Secondary | ICD-10-CM | POA: Diagnosis not present

## 2022-02-17 DIAGNOSIS — I5022 Chronic systolic (congestive) heart failure: Secondary | ICD-10-CM | POA: Diagnosis not present

## 2022-02-17 DIAGNOSIS — E785 Hyperlipidemia, unspecified: Secondary | ICD-10-CM | POA: Diagnosis not present

## 2022-02-17 DIAGNOSIS — Z8673 Personal history of transient ischemic attack (TIA), and cerebral infarction without residual deficits: Secondary | ICD-10-CM | POA: Diagnosis not present

## 2022-02-17 DIAGNOSIS — I480 Paroxysmal atrial fibrillation: Secondary | ICD-10-CM | POA: Diagnosis not present

## 2022-02-17 DIAGNOSIS — T826XXD Infection and inflammatory reaction due to cardiac valve prosthesis, subsequent encounter: Secondary | ICD-10-CM

## 2022-02-17 DIAGNOSIS — Z792 Long term (current) use of antibiotics: Secondary | ICD-10-CM | POA: Insufficient documentation

## 2022-02-17 DIAGNOSIS — I38 Endocarditis, valve unspecified: Secondary | ICD-10-CM | POA: Diagnosis not present

## 2022-02-17 DIAGNOSIS — I440 Atrioventricular block, first degree: Secondary | ICD-10-CM | POA: Insufficient documentation

## 2022-02-17 DIAGNOSIS — I484 Atypical atrial flutter: Secondary | ICD-10-CM | POA: Diagnosis not present

## 2022-02-17 DIAGNOSIS — I69354 Hemiplegia and hemiparesis following cerebral infarction affecting left non-dominant side: Secondary | ICD-10-CM | POA: Diagnosis not present

## 2022-02-17 DIAGNOSIS — G4089 Other seizures: Secondary | ICD-10-CM | POA: Diagnosis not present

## 2022-02-17 DIAGNOSIS — Z7401 Bed confinement status: Secondary | ICD-10-CM | POA: Diagnosis not present

## 2022-02-17 DIAGNOSIS — R002 Palpitations: Secondary | ICD-10-CM | POA: Insufficient documentation

## 2022-02-17 NOTE — Progress Notes (Signed)
Date:  02/17/2022   ID:  [name] [name], DOB 1940/03/20, MRN [id number]    Provider location: [place] [facility] Type of Visit: Established patient  PCP:  [name], MD  Cardiologist:  Dr. [name]   History of Present Illness: [name] [name] is a 81 y.o. 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  He also has an ascending aortic aneurysm.   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  I started him on dronedarone and coumadin with plan for cardioversion after 4 wks of therapeutic anticoagulation if he did not convert back to NSR on his own.  He went back into NSR spontaneously and later stopped coumadin. Atrial flutter recurred in 3/15 and again resolved spontaneously.   In 6/15, patient was admitted with transient dysarthria (resolved in about 10 minutes).  However, he was found to have evidence for CVA by MRI.  Therefore, he was started on coumadin. He was noted to be in atypical atrial flutter again.  Ranolazine was added to dronedarone.     In 8/15, [name] was seen in consultation at the [facility] clinic.  He had atrial flutter ablation at [facility] in 11/15.     He was seen in followup at [facility] in 2/16.  Ranolazine was stopped.  Echo in 2/16 showed EF 55% with stable bioprosthetic mitral and aortic valves.  CTA chest showed stable 5 cm ascending aortic aneurysm.  He went back into atypical atrial flutter in 4/16.  Ranolazine was restarted and he was cardioverted back to NSR.      Cardiolite in 12/18 showed no ischemia. He wore a 30 day monitor in 12/18 with no arrhythmia.     He went into atrial flutter again during a respiratory illness in 2/19.  On 04/24/17, he was cardioverted back to NSR.  He thinks that he went into atrial flutter again later on that day. He was back in atrial flutter at followup  appointment. He was then admitted for Tikosyn loading in 4/19 and was cardioverted back to NSR after the load.    He had atrial fibrillation again in 6/19 with DCCV.    He was seen at the [facility] in 9/19.  Echo showed normal LV and RV systolic function, bioprosthetic MV with mean gradient up to 9 mmHg, normal bioprosthetic AoV.  CTA chest showed aortic root up to 5.3 cm.    He was back in atrial flutter in 12/19, had DCCV back to NSR.  Echo in 12/19 showed EF 60-65%, normal bioprosthetic aortic valve, bioprosthetic mitral valve with mean gradient 4 mmHg and no MR, severe LAE, aortic root 4.8 cm.   He went to the [facility] in 3/20.  Echo at that time showed EF 60-65% with stable bioprosthetic mitral and aortic valves (mean gradient 7 mmHg across MV and 9 mmHg across AoV).  CTA showed 5.0 cm aortic root and ascending aorta, stable.  He returned to the [facility] in 7/20.  He had atrial fibrillation/flutter ablation again and thinks he has been in NSR since that time.  CTA chest showed 5.1 cm ascending thoracic aorta. Seen at [facility] in 7/21.  Echo showed stable bioprosthetic mitral and aortic valves with normal EF.  CTA chest showed 5.0 cm ascending aortic aneurysm.   Last visit  at [facility] clinic 1/22 he was doing well, exercising and euvolemic in NSR.  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 He subsequently developed right MCA territory stroke (as Eliquis was held) in June 2022, requiring thrombectomy and was discharged to SNF with significant disability.    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 Initial echo showed EF 65-70%, no obvious vegetation. Cards consulted and TEE showed bioprosthetic MV vegetation with trivial MR and mean gradient 8.  He is not a candidate for mitral valve  replacement.  ID consulted he was started on Rocephin/ampicillin via PICC with a stop date of 01/28/21 then continue on amoxicillin 500 mg bid indefinitely. Hospital course complicated by prolonged encephalopathy.  Neurology consulted and MRI brian showed new left cerebellar vermis CVA, likely septic emboli in the setting of mitral valve endocarditis.  He was discharged back to SNF, weight 187 lbs.  He was readmitted in 5/22 with volume overload and was diuresed. He had transient atrial fibrillation but went back into NSR.   Echo in 1/23 on my review showed  EF 50%, mild-moderate LVH, mildly decreased RV systolic function, bioprosthetic mitral valve appeared to have vegetation present with mild MR, mean gradient 8 mmHg (mild-moderate bioprosthetic valve stenosis), bioprosthetic aortic valve with mean gradient 9 mmHg and no AI, the bioprosthetic aortic valve appeared thickened, 5.1 cm ascending aorta.  Blood cultures were done and showing no growth.  ESR and CRP were normal.  It was suspected that the vegetation noted was sterile.   Echo in 10/23 looked significantly worse in terms of valvular function.  EF 55%, D-shaped septum suggesting RV pressure/volume overload, moderate RVE with moderate RV dysfunction, bioprosthetic mitral valve thickened and appeared to have vegetation, mean MV gradient 20 mmHg with MVA 1.1 cm2 suggesting severe bioprosthetic mitral valve stenosis, mild MR; bioprosthetic aortic valve was thickened with mean gradient 39 and AVA 1.3 cm2, DI 0.26, moderate-severe bioprosthetic AoV stenosis, IVC dilated. Blood cultures were done in 10/23 and 11/23 due to concern for residual vegetation on mitral valve, both sets were negative.   He returns today for followup of diastolic CHF and mitral valve endocarditis.  He wears 2 L oxygen at all times.  He is more responsive to me today and follows commands.  According to his wife, he will say a few words.  He has gained weight but breathing is better  and he is not having tachypneic spells now.  No BRBPR/melena.  He is in NSR today.   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, QTc 449 msec   Labs (3/14): LDL 64, TSH normal, K 3.9, creatinine 1.0     Labs (6/15): LDL 54, HDL 48, creatinine 0.95   Labs (9/15): LDL 54, HDL 25 Labs (10/15): K 4.5, creatinine 1.3, AST 45, ALT 64 Labs (4/16): K 4.3, creatinine 1.28, HCT 40.7 Labs (5/17): K 4.1, creatinine 1.16, LDL 66, HDL 47 Labs (2/18): LDL 42, HDL 43, K 4.4, creatinine 1.48 Labs (3/18): hgb 13.8 Labs (2/19): LDL 59 Labs (4/19): K 3.9, creatinine 0.97, Mg 2.0 Labs (6/19): K 4.4, creatinine 0.9 Labs (10/19): LDL 84 Labs (12/19): K 4.2, creatinine 1.13, hgb 13.9 Labs (3/20): K 4.1, creatinine 0.98 Labs (6/20): K 4.1, creatinine 0.91, LDL 103  Labs (9/20): K 4, creatinine 0.95 Labs (1/21): K 4, creatinine 0.94, LDL 80 Labs (5/21): K 4.3, creatinine 0.92 Labs (11/22): K  3.2, creatinine 0.6 => 0.88, WBCs 13, ESR 70 Labs (1/23): hgb 10.2, K 4, creatinine 0.6, AST/ALT normal Labs (3/23): ESR 11, CRP 2.7 Labs (4/23): K 4.3, creatinine 0.68 Labs (9/23): K 4.3, creatinine 0.9 Labs (10/23): K 3.7, creatinine 0.9 Labs (11/23): ESR 39, hgb 9, K 3.6, creatinine 1.14                                                                                                                              PMH: 1. Atrial fibrillation: s/p MAZE and LAA ligation in 2006.  No documented atrial fibrillation since operation.  2. Atypical atrial flutter: 10/13, 3/15, and again in 6/15.  Recurrent 4/16 with DCCV.  - Event monitor (12/18): no significant arrhythmia.  - Recurrence in 2/19: DCCV to NSR but quickly recurred.  - Admitted in 4/19 for Tikosyn loading and cardioverted to NSR.  - DCCV to NSR in 6/19.  - DCCV to NSR in 12/19. - 7/20 redo atrial flutter/fibrillation ablation.   3. 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   - Echo 5/11  with EF 60-65%, normal bioprosthetic aortic valve, normal bioprosthetic mitral valve, moderate LAE.   - Echo 11/12 at [facility] looked ok per report.  - Echo (10/13) with EF 55-60%, mild LVH, normal bioprosthetic aortic and mitral valves, moderate LAE, PA systolic pressure 35 mmHg.   - Echo (6/15) with EF 60%, moderate LVH, normal-appearing bioprosthetic MV and AoV, PA systolic pressure 32 mmHg.   - Echo 9/15 [facility]) with EF 55%, mild LVH, PASP 52 mmHg, severe LAE, bioprosthetic mitral valve with no MR and mean gradient 11 mmHg, moderate TR, bioprosthetic aortic valve with no AI and mean gradient 9 mmHg, dilated ascending aorta to 5 cm.   - Echo (2/16, [facility]) with EF 55%, stable bioprosthetic aortic and mitral valves (AoV mean gradient 7 mmHg, MV mean gradient 4 mmHg), normal RV size and systolic function.  - Echo (12/16, [facility]) with EF 58%, mild RV dilation with low normal systolic function, PA systolic pressure 34 mmHg, bioprosthetic MV and AoV functioning normally.  - Echo (5/18, [facility]) with EF 60-65%, RV normal size and systolic function, moderate LAE, bioprosthetic MV mean gradient 6 with no MR, bioprosthetic aortic valve with mean gradient 11 and no AI, PASP 41 mmHg.  - Echo (2/19): EF 60-65%, moderate LVH, bioprosthetic aortic and mitral valves functioning normally, PASP 23 mmHg, aortic root 4.5 cm.  - Echo (9/19): EF 56%, normal RV size and systolic function, bioprosthetic MV with mean gradient 9 mmHg, trivial MR, bioprosthetic aortic valve with mean gradient 12 mmHg, PASP 51 mmHg.  - Echo (12/19): EF 60-65%, normal bioprosthetic aortic valve, bioprosthetic mitral valve with mean gradient 4 mmHg and no MR, severe LAE, aortic root 4.8 cm.  - Echo (3/20, [facility]): EF 60-65%, normal RV, severe LAE, 5.0 cm ascending aorta, bioprosthetic MV with  mean gradient 7 mmHg, bioprosthetic aortic valve with mean gradient 9 mmHg.  - Echo (7/20,  [facility]): EF 55-60%, moderate septal hypertrophy, normal RV size and systolic function, bioprosthetic MV with trace MR and mean gradient 4, bioprosthetic aortic valve with mean gradient 14 mmHg, severe LAE.  - Echo (7/21, [facility]): EF 60%, normal RV, s/p bioprosthetic mitral valve with trace MR and mean gradient 5, s/p bioprosthetic aortic valve with trace AI and mean gradient 15 mmHg, ascending aorta 4.9 cm.  - Echo (10/22): EF 65-70%, normal RV, bioprosthetic AVR mean gradient 16 mmHg appears normal, bioprosthetic MV mean gradient 10, increased from prior but HR also in 100s when study done.   - TEE 10/22 bioprosthetic MV vegetation with trivial MR and mean gradient 8.  - Echo (1/23): EF 50%, mild-moderate LVH, mildly decreased RV systolic function, bioprosthetic mitral valve appears to have vegetation present with mild MR, mean gradient 8 mmHg (mild-moderate bioprosthetic valve stenosis), bioprosthetic aortic valve with mean gradient 9 mmHg and no AI, the bioprosthetic aortic valve appears thickened, 5.1 cm ascending aorta.  - Echo (10/23): EF 55%, D-shaped septum suggesting RV pressure/volume overload, moderate RVE with moderate RV dysfunction, bioprosthetic mitral valve thickened and appears to have vegetation, mean MV gradient 20 mmHg with MVA 1.1 cm2 suggesting severe bioprosthetic mitral valve stenosis, mild MR; bioprosthetic aortic valve was thickened with mean gradient 39 and AVA 1.3 cm2, DI 0.26, moderate-severe bioprosthetic AoV stenosis, IVC dilated.  4. Hyperlipidemia 5. L-spine surgery 6. LHC with no significant coronary disease in 2006 pre-op.  - Cardiolite (12/18): No ischemia.  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 - SDH 6/22 with craniotomy followed by MCA CVA due to occluded ICA while off Eliquis (revascularized).  - 10/22: Small left cerebellar infarct possibly embolic from MV endocarditis. 8. Ascending aorta/aortic root aneurysm: 5.0 cm  ascending aorta on 9/15 echo.  CTA chest 9/15 with dilated sinuses of valsalva to 5.0 cm and ascending aorta 4.9 cm. CTA chest (2/16) with 5.0 cm ascending aorta.  CTA chest (12/16) with 4.9 cm aortic root at SOV, 4.8 cm ascending aorta.  - CT chest (5/18, [facility]): 5.0 cm aortic root, 5.0 cm ascending aorta.  - CTA chest (10/19, [facility]): 5.3 cm aortic root, 5.2 cm ascending aorta.  - CTA chest (3/20, [facility]): 5.0 cm aortic root and ascending aorta - CTA chest (7/20, [facility]): 5.1 cm ascending aorta - CTA chest (7/21, [facility]): 5.1 cm aortic root, 5.0 cm ascending aorta.  9. 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 Subdural hematoma 5/22 with craniotomy 12. 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 Thrombectomy was done.   Current Outpatient Medications  Medication Sig Dispense Refill   acetaminophen (TYLENOL) 325 MG tablet Take 650 mg by mouth as needed for mild pain.     Amantadine HCl 100 MG tablet Take 100 mg by mouth 2 (two) times daily.     amLODipine (NORVASC) 5 MG tablet Take 0.5 tablets (2.5 mg total) by mouth daily. 60 tablet 1   amoxicillin (AMOXIL) 500 MG capsule Take 500 mg by mouth 2 (two) times daily.     apixaban (ELIQUIS) 5 MG TABS tablet Take 1 tablet (5 mg total) by mouth 2 (two) times daily. 60 tablet 0   Artificial Saliva (BIOTENE MOISTURIZING MOUTH MT) Use as directed 2 sprays in the mouth or throat in the morning, at noon, and at bedtime.     atorvastatin (LIPITOR)  80 MG tablet Take 1 tablet (80 mg total) by mouth daily.     bisacodyl (DULCOLAX) 10 MG suppository Place 1 suppository (10 mg total) rectally daily as needed for moderate constipation or mild constipation.     clobetasol (TEMOVATE) 0.05 % external solution Apply 1 application  topically as needed (itching).     diazePAM, 15 MG Dose, (VALTOCO 15 MG DOSE) 2 x 7.5 MG/0.1ML LQPK Place 7.5 mg into each nostril (total of 15 mg)  for grand mal seizure lasting over 5 minutes. Can repeat once in 4 hours. Do Not use more than twice in 24 hours 2 each 1   dofetilide (TIKOSYN) 125 MCG capsule Take 3 capsules (375 mcg total) by mouth 2 (two) times daily.     famotidine (PEPCID) 20 MG tablet Take 20 mg by mouth at bedtime as needed for indigestion.     finasteride (PROSCAR) 5 MG tablet Take 1 tablet (5 mg total) by mouth daily. 30 tablet 0   Glucerna (GLUCERNA) LIQD Take 237 mLs by mouth daily.     hydrocortisone 2.5 % cream Apply 1 application. topically daily as needed (itching).     lacosamide (VIMPAT) 10 MG/ML oral solution Take 7.5 mLs (75 mg total) by mouth at bedtime. 600 mL 1   lacosamide (VIMPAT) 10 MG/ML oral solution Take 5 mLs (50 mg total) by mouth in the morning. 600 mL 1   levalbuterol (XOPENEX) 0.63 MG/3ML nebulizer solution Take 0.63 mg by nebulization every 6 (six) hours as needed for wheezing or shortness of breath.     magnesium gluconate (MAGONATE) 500 MG tablet Take 1 tablet (500 mg total) by mouth daily. 30 tablet 0   melatonin 5 MG TABS Take 5 mg by mouth at bedtime.     methenamine (MANDELAMINE) 1 g tablet Take 1,000 mg by mouth 2 (two) times daily.     methylphenidate (RITALIN) 5 MG tablet Take 1 tablet (5 mg total) by mouth 2 (two) times daily. Take at 7 am and 12 pm 60 tablet 0   Multiple Vitamin (MULTIVITAMIN WITH MINERALS) TABS tablet Take 1 tablet by mouth daily.     omeprazole (PRILOSEC) 20 MG capsule Take 20 mg by mouth daily.     Polyethyl Glycol-Propyl Glycol (SYSTANE) 0.4-0.3 % SOLN Place 2 drops into both eyes daily.     polyethylene glycol (MIRALAX / GLYCOLAX) 17 g packet Take 17 g by mouth every other day. 14 each    potassium chloride (KLOR-CON) 10 MEQ tablet Take 2 tablets (20 mEq total) by mouth daily. 90 tablet 3   senna-docusate (SENOKOT-S) 8.6-50 MG tablet Take 1 tablet by mouth at bedtime as needed.     Torsemide 40 MG TABS Take 40 mg by mouth daily. 30 tablet 0   No current  facility-administered medications for this encounter.    Allergies:   Naproxen and No healthtouch food allergies   Social History:  The patient  reports that he has never smoked. He has never used smokeless tobacco. He reports that he does not drink alcohol and does not use drugs.   Family History:  The patient's family history includes Cancer in his father; Diabetes in his brother and paternal aunt; Heart attack in his maternal grandfather; Macular degeneration in his mother.   ROS:  Please see the history of present illness.   All other systems are personally reviewed and negative.   Exam:   BP (!) 92/50   Pulse 67   SpO2 100% Comment: 2  l n/c General: NAD Neck: No JVD, no thyromegaly or thyroid nodule.  Lungs: Clear to auscultation bilaterally with normal respiratory effort. CV: Nondisplaced PMI.  Heart regular S1/S2, no S3/S4, 2/6 SEM RUSB.  No peripheral edema.  No carotid bruit.  Normal pedal pulses.  Abdomen: Soft, nontender, no hepatosplenomegaly, no distention.  Skin: Intact without lesions or rashes.  Neurologic: Follows commands.  Left hemiparesis.  Extremities: No clubbing or cyanosis.  HEENT: Normal.   Wt Readings from Last 3 Encounters:  02/03/22 63.3 kg (139 lb 9.6 oz)  01/24/22 64.7 kg (142 lb 9.6 oz)  01/13/22 62.1 kg (136 lb 12.8 oz)    ASSESSMENT AND PLAN 1. Chronic Diastolic Heart Failure: Echo in 10/23 showed EF 55%, D-shaped interventricular septum, moderate RVE and RV dysfunction, severe bioprosthetic MV stenosis and moderate-severe bioprosthetic AoV stenosis. He is doing better today, no tachypneic spells.  Not volume overloaded on exam.    - Continue torsemide 40 mg daily, he gets regular BMETs at [facility]   - Continue KCl 20 mEq daily.  2. 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  Baseline, he is bed-bound, left hemiparesis. Also in 10/22 had new small left cerebellar infarct  possibly embolic from endocarditis.  He will follow commands today but does not speak.  3. Enterococcal bacteremia: Source uncertain.  No PNA noted on CXR.  Enterococcus did not grow in urine.  Patient had bioprosthetic MV endocarditis by 10/22 TEE, valve function not markedly abnormal (mean gradient elevated at 8 with trivial MR).  He had a new left cerebellar CVA in 10/22 (small) that may have been embolic from MV vegetation.  With prior sternotomy and minimal mobility, he is not a candidate for redo valve surgery.  He completed 6 week course of ceftriaxone/ampicillin.  My review of 1/23 echo showed vegetation still present on mitral valve with mean gradient 8 mmHg and mild MR, and the aortic valve is thickened but not definitive vegetation. Blood cultures in 1/23 were negative, and ESR and CRP were normal. Echo in 10/23 showed vegetation and thickening of MV with thickening also of AoV though no definite mobile vegetation noted.  Repeat blood cultures in 10/23 and 11/23 were negative.  - Continue amoxicillin long-term.  4. 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  Echo in 10/23 showed bioprosthetic AVR to be thickened with moderate-severe stenosis.  Bioprosthetic MV was thickened with vegetation present, severe bioprosthetic MV stenosis.  He is not a candidate for surgery and also would be a poor candidate for percutaneous valve replacement.  Options are quite limited, will be medical management with diuretics.   5. Atrial fibrillation/Flutter: Maze in 2006 + 2 flutter/fib ablations at [facility]. Remains on decreased Tikosyn 375 mcg bid due to previously long QT. 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  - Continue Tikosyn 375 mcg bid. BMET today.  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  - Continue Eliquis.  6. Hyperlipidemia: He is on atorvastatin.  7. HTN:  BP stable.   Family not interested in hospice at this time though he is DNR.   Followup 6 months. He sees the medical staff at [facility] regularly.   Loralie Champagne 02/17/2022  [facility] [address] [facility] and [place] [address] [phone] (office) [phone] (fax)

## 2022-02-17 NOTE — Patient Instructions (Signed)
No changes to medications.  Your physician recommends that you schedule a follow-up appointment in: 6 months ( June 2024)  ** please call the office in April to arrange follow up appointment **  If you have any questions or concerns before your next appointment please send [place] a message through [place] or call our office at [phone].    TO LEAVE A MESSAGE FOR THE NURSE SELECT OPTION 2, PLEASE LEAVE A MESSAGE INCLUDING: YOUR NAME DATE OF BIRTH CALL BACK NUMBER REASON FOR CALL**this is important as we prioritize the call backs  YOU WILL RECEIVE A CALL BACK THE SAME DAY AS LONG AS YOU CALL BEFORE 4:00 PM  At the [facility], you and your health needs are our priority. As part of our continuing mission to provide you with exceptional heart care, we have created designated Provider Care Teams. These Care Teams include your primary Cardiologist (physician) and Advanced Practice Providers (APPs- Physician Assistants and Nurse Practitioners) who all work together to provide you with the care you need, when you need it.   You may see any of the following providers on your designated Care Team at your next follow up: Dr Glori Bickers Dr Loralie Champagne Dr. Roxana Hires, NP Lyda Jester, [place] [facility] [place], [place] Forestine Na, NP Audry Riles, PharmD   Please be sure to bring in all your medications bottles to every appointment.

## 2022-02-18 DIAGNOSIS — H538 Other visual disturbances: Secondary | ICD-10-CM | POA: Diagnosis not present

## 2022-02-18 DIAGNOSIS — I5022 Chronic systolic (congestive) heart failure: Secondary | ICD-10-CM | POA: Diagnosis not present

## 2022-02-18 DIAGNOSIS — I69391 Dysphagia following cerebral infarction: Secondary | ICD-10-CM | POA: Diagnosis not present

## 2022-02-18 DIAGNOSIS — M6389 Disorders of muscle in diseases classified elsewhere, multiple sites: Secondary | ICD-10-CM | POA: Diagnosis not present

## 2022-02-18 DIAGNOSIS — R278 Other lack of coordination: Secondary | ICD-10-CM | POA: Diagnosis not present

## 2022-02-18 DIAGNOSIS — R41841 Cognitive communication deficit: Secondary | ICD-10-CM | POA: Diagnosis not present

## 2022-02-18 DIAGNOSIS — S065X3S Traumatic subdural hemorrhage with loss of consciousness of 1 hour to 5 hours 59 minutes, sequela: Secondary | ICD-10-CM | POA: Diagnosis not present

## 2022-02-18 DIAGNOSIS — I69812 Visuospatial deficit and spatial neglect following other cerebrovascular disease: Secondary | ICD-10-CM | POA: Diagnosis not present

## 2022-02-18 DIAGNOSIS — G4089 Other seizures: Secondary | ICD-10-CM | POA: Diagnosis not present

## 2022-02-18 DIAGNOSIS — R293 Abnormal posture: Secondary | ICD-10-CM | POA: Diagnosis not present

## 2022-02-18 DIAGNOSIS — I69354 Hemiplegia and hemiparesis following cerebral infarction affecting left non-dominant side: Secondary | ICD-10-CM | POA: Diagnosis not present

## 2022-02-18 DIAGNOSIS — R41840 Attention and concentration deficit: Secondary | ICD-10-CM | POA: Diagnosis not present

## 2022-02-21 DIAGNOSIS — I69391 Dysphagia following cerebral infarction: Secondary | ICD-10-CM | POA: Diagnosis not present

## 2022-02-21 DIAGNOSIS — G4089 Other seizures: Secondary | ICD-10-CM | POA: Diagnosis not present

## 2022-02-21 DIAGNOSIS — R41841 Cognitive communication deficit: Secondary | ICD-10-CM | POA: Diagnosis not present

## 2022-02-21 DIAGNOSIS — I5033 Acute on chronic diastolic (congestive) heart failure: Secondary | ICD-10-CM | POA: Diagnosis not present

## 2022-02-21 DIAGNOSIS — I69354 Hemiplegia and hemiparesis following cerebral infarction affecting left non-dominant side: Secondary | ICD-10-CM | POA: Diagnosis not present

## 2022-02-22 DIAGNOSIS — G4089 Other seizures: Secondary | ICD-10-CM | POA: Diagnosis not present

## 2022-02-22 DIAGNOSIS — R41840 Attention and concentration deficit: Secondary | ICD-10-CM | POA: Diagnosis not present

## 2022-02-22 DIAGNOSIS — R278 Other lack of coordination: Secondary | ICD-10-CM | POA: Diagnosis not present

## 2022-02-22 DIAGNOSIS — H538 Other visual disturbances: Secondary | ICD-10-CM | POA: Diagnosis not present

## 2022-02-22 DIAGNOSIS — M6389 Disorders of muscle in diseases classified elsewhere, multiple sites: Secondary | ICD-10-CM | POA: Diagnosis not present

## 2022-02-22 DIAGNOSIS — R293 Abnormal posture: Secondary | ICD-10-CM | POA: Diagnosis not present

## 2022-02-22 DIAGNOSIS — I5022 Chronic systolic (congestive) heart failure: Secondary | ICD-10-CM | POA: Diagnosis not present

## 2022-02-22 DIAGNOSIS — I69391 Dysphagia following cerebral infarction: Secondary | ICD-10-CM | POA: Diagnosis not present

## 2022-02-22 DIAGNOSIS — R41841 Cognitive communication deficit: Secondary | ICD-10-CM | POA: Diagnosis not present

## 2022-02-22 DIAGNOSIS — I69812 Visuospatial deficit and spatial neglect following other cerebrovascular disease: Secondary | ICD-10-CM | POA: Diagnosis not present

## 2022-02-22 DIAGNOSIS — S065X3S Traumatic subdural hemorrhage with loss of consciousness of 1 hour to 5 hours 59 minutes, sequela: Secondary | ICD-10-CM | POA: Diagnosis not present

## 2022-02-22 DIAGNOSIS — I69354 Hemiplegia and hemiparesis following cerebral infarction affecting left non-dominant side: Secondary | ICD-10-CM | POA: Diagnosis not present

## 2022-02-23 ENCOUNTER — Other Ambulatory Visit: Payer: Self-pay | Admitting: Orthopedic Surgery

## 2022-02-23 ENCOUNTER — Other Ambulatory Visit: Payer: Self-pay | Admitting: Internal Medicine

## 2022-02-23 DIAGNOSIS — S065X3S Traumatic subdural hemorrhage with loss of consciousness of 1 hour to 5 hours 59 minutes, sequela: Secondary | ICD-10-CM | POA: Diagnosis not present

## 2022-02-23 DIAGNOSIS — I5022 Chronic systolic (congestive) heart failure: Secondary | ICD-10-CM | POA: Diagnosis not present

## 2022-02-23 DIAGNOSIS — I69354 Hemiplegia and hemiparesis following cerebral infarction affecting left non-dominant side: Secondary | ICD-10-CM | POA: Diagnosis not present

## 2022-02-23 DIAGNOSIS — H538 Other visual disturbances: Secondary | ICD-10-CM | POA: Diagnosis not present

## 2022-02-23 DIAGNOSIS — M6389 Disorders of muscle in diseases classified elsewhere, multiple sites: Secondary | ICD-10-CM | POA: Diagnosis not present

## 2022-02-23 DIAGNOSIS — R41841 Cognitive communication deficit: Secondary | ICD-10-CM | POA: Diagnosis not present

## 2022-02-23 DIAGNOSIS — R293 Abnormal posture: Secondary | ICD-10-CM | POA: Diagnosis not present

## 2022-02-23 DIAGNOSIS — I69391 Dysphagia following cerebral infarction: Secondary | ICD-10-CM | POA: Diagnosis not present

## 2022-02-23 DIAGNOSIS — G4089 Other seizures: Secondary | ICD-10-CM | POA: Diagnosis not present

## 2022-02-23 DIAGNOSIS — R41840 Attention and concentration deficit: Secondary | ICD-10-CM | POA: Diagnosis not present

## 2022-02-23 DIAGNOSIS — R278 Other lack of coordination: Secondary | ICD-10-CM | POA: Diagnosis not present

## 2022-02-23 DIAGNOSIS — I69812 Visuospatial deficit and spatial neglect following other cerebrovascular disease: Secondary | ICD-10-CM | POA: Diagnosis not present

## 2022-02-23 MED ORDER — METHYLPHENIDATE HCL 5 MG PO TABS: 5.0000 mg | ORAL_TABLET | Freq: Two times a day (BID) | ORAL | 0 refills | Status: DC

## 2022-02-24 DIAGNOSIS — I69354 Hemiplegia and hemiparesis following cerebral infarction affecting left non-dominant side: Secondary | ICD-10-CM | POA: Diagnosis not present

## 2022-02-24 DIAGNOSIS — I69391 Dysphagia following cerebral infarction: Secondary | ICD-10-CM | POA: Diagnosis not present

## 2022-02-24 DIAGNOSIS — G4089 Other seizures: Secondary | ICD-10-CM | POA: Diagnosis not present

## 2022-02-24 DIAGNOSIS — R41841 Cognitive communication deficit: Secondary | ICD-10-CM | POA: Diagnosis not present

## 2022-02-25 DIAGNOSIS — I69812 Visuospatial deficit and spatial neglect following other cerebrovascular disease: Secondary | ICD-10-CM | POA: Diagnosis not present

## 2022-02-25 DIAGNOSIS — R41840 Attention and concentration deficit: Secondary | ICD-10-CM | POA: Diagnosis not present

## 2022-02-25 DIAGNOSIS — R278 Other lack of coordination: Secondary | ICD-10-CM | POA: Diagnosis not present

## 2022-02-25 DIAGNOSIS — R41841 Cognitive communication deficit: Secondary | ICD-10-CM | POA: Diagnosis not present

## 2022-02-25 DIAGNOSIS — S065X3S Traumatic subdural hemorrhage with loss of consciousness of 1 hour to 5 hours 59 minutes, sequela: Secondary | ICD-10-CM | POA: Diagnosis not present

## 2022-02-25 DIAGNOSIS — I5022 Chronic systolic (congestive) heart failure: Secondary | ICD-10-CM | POA: Diagnosis not present

## 2022-02-25 DIAGNOSIS — M6389 Disorders of muscle in diseases classified elsewhere, multiple sites: Secondary | ICD-10-CM | POA: Diagnosis not present

## 2022-02-25 DIAGNOSIS — R293 Abnormal posture: Secondary | ICD-10-CM | POA: Diagnosis not present

## 2022-02-25 DIAGNOSIS — I69391 Dysphagia following cerebral infarction: Secondary | ICD-10-CM | POA: Diagnosis not present

## 2022-02-25 DIAGNOSIS — G4089 Other seizures: Secondary | ICD-10-CM | POA: Diagnosis not present

## 2022-02-25 DIAGNOSIS — H538 Other visual disturbances: Secondary | ICD-10-CM | POA: Diagnosis not present

## 2022-02-25 DIAGNOSIS — I69354 Hemiplegia and hemiparesis following cerebral infarction affecting left non-dominant side: Secondary | ICD-10-CM | POA: Diagnosis not present

## 2022-03-01 DIAGNOSIS — I69391 Dysphagia following cerebral infarction: Secondary | ICD-10-CM | POA: Diagnosis not present

## 2022-03-01 DIAGNOSIS — I69354 Hemiplegia and hemiparesis following cerebral infarction affecting left non-dominant side: Secondary | ICD-10-CM | POA: Diagnosis not present

## 2022-03-01 DIAGNOSIS — G4089 Other seizures: Secondary | ICD-10-CM | POA: Diagnosis not present

## 2022-03-01 DIAGNOSIS — R41841 Cognitive communication deficit: Secondary | ICD-10-CM | POA: Diagnosis not present

## 2022-03-01 DIAGNOSIS — I5033 Acute on chronic diastolic (congestive) heart failure: Secondary | ICD-10-CM | POA: Diagnosis not present

## 2022-03-02 DIAGNOSIS — H538 Other visual disturbances: Secondary | ICD-10-CM | POA: Diagnosis not present

## 2022-03-02 DIAGNOSIS — I69391 Dysphagia following cerebral infarction: Secondary | ICD-10-CM | POA: Diagnosis not present

## 2022-03-02 DIAGNOSIS — S065X3S Traumatic subdural hemorrhage with loss of consciousness of 1 hour to 5 hours 59 minutes, sequela: Secondary | ICD-10-CM | POA: Diagnosis not present

## 2022-03-02 DIAGNOSIS — R41841 Cognitive communication deficit: Secondary | ICD-10-CM | POA: Diagnosis not present

## 2022-03-02 DIAGNOSIS — I5022 Chronic systolic (congestive) heart failure: Secondary | ICD-10-CM | POA: Diagnosis not present

## 2022-03-02 DIAGNOSIS — R41840 Attention and concentration deficit: Secondary | ICD-10-CM | POA: Diagnosis not present

## 2022-03-02 DIAGNOSIS — R278 Other lack of coordination: Secondary | ICD-10-CM | POA: Diagnosis not present

## 2022-03-02 DIAGNOSIS — I69354 Hemiplegia and hemiparesis following cerebral infarction affecting left non-dominant side: Secondary | ICD-10-CM | POA: Diagnosis not present

## 2022-03-02 DIAGNOSIS — R293 Abnormal posture: Secondary | ICD-10-CM | POA: Diagnosis not present

## 2022-03-02 DIAGNOSIS — M6389 Disorders of muscle in diseases classified elsewhere, multiple sites: Secondary | ICD-10-CM | POA: Diagnosis not present

## 2022-03-02 DIAGNOSIS — I69812 Visuospatial deficit and spatial neglect following other cerebrovascular disease: Secondary | ICD-10-CM | POA: Diagnosis not present

## 2022-03-02 DIAGNOSIS — G4089 Other seizures: Secondary | ICD-10-CM | POA: Diagnosis not present

## 2022-03-03 DIAGNOSIS — R293 Abnormal posture: Secondary | ICD-10-CM | POA: Diagnosis not present

## 2022-03-03 DIAGNOSIS — R41840 Attention and concentration deficit: Secondary | ICD-10-CM | POA: Diagnosis not present

## 2022-03-03 DIAGNOSIS — R41841 Cognitive communication deficit: Secondary | ICD-10-CM | POA: Diagnosis not present

## 2022-03-03 DIAGNOSIS — S065X3S Traumatic subdural hemorrhage with loss of consciousness of 1 hour to 5 hours 59 minutes, sequela: Secondary | ICD-10-CM | POA: Diagnosis not present

## 2022-03-03 DIAGNOSIS — I69812 Visuospatial deficit and spatial neglect following other cerebrovascular disease: Secondary | ICD-10-CM | POA: Diagnosis not present

## 2022-03-03 DIAGNOSIS — R278 Other lack of coordination: Secondary | ICD-10-CM | POA: Diagnosis not present

## 2022-03-03 DIAGNOSIS — H538 Other visual disturbances: Secondary | ICD-10-CM | POA: Diagnosis not present

## 2022-03-03 DIAGNOSIS — G4089 Other seizures: Secondary | ICD-10-CM | POA: Diagnosis not present

## 2022-03-03 DIAGNOSIS — I69391 Dysphagia following cerebral infarction: Secondary | ICD-10-CM | POA: Diagnosis not present

## 2022-03-03 DIAGNOSIS — I69354 Hemiplegia and hemiparesis following cerebral infarction affecting left non-dominant side: Secondary | ICD-10-CM | POA: Diagnosis not present

## 2022-03-03 DIAGNOSIS — M6389 Disorders of muscle in diseases classified elsewhere, multiple sites: Secondary | ICD-10-CM | POA: Diagnosis not present

## 2022-03-03 DIAGNOSIS — I5022 Chronic systolic (congestive) heart failure: Secondary | ICD-10-CM | POA: Diagnosis not present

## 2022-03-04 DIAGNOSIS — I69812 Visuospatial deficit and spatial neglect following other cerebrovascular disease: Secondary | ICD-10-CM | POA: Diagnosis not present

## 2022-03-04 DIAGNOSIS — I69391 Dysphagia following cerebral infarction: Secondary | ICD-10-CM | POA: Diagnosis not present

## 2022-03-04 DIAGNOSIS — G4089 Other seizures: Secondary | ICD-10-CM | POA: Diagnosis not present

## 2022-03-04 DIAGNOSIS — I5022 Chronic systolic (congestive) heart failure: Secondary | ICD-10-CM | POA: Diagnosis not present

## 2022-03-04 DIAGNOSIS — R278 Other lack of coordination: Secondary | ICD-10-CM | POA: Diagnosis not present

## 2022-03-04 DIAGNOSIS — S065X3S Traumatic subdural hemorrhage with loss of consciousness of 1 hour to 5 hours 59 minutes, sequela: Secondary | ICD-10-CM | POA: Diagnosis not present

## 2022-03-04 DIAGNOSIS — R41840 Attention and concentration deficit: Secondary | ICD-10-CM | POA: Diagnosis not present

## 2022-03-04 DIAGNOSIS — R293 Abnormal posture: Secondary | ICD-10-CM | POA: Diagnosis not present

## 2022-03-04 DIAGNOSIS — H538 Other visual disturbances: Secondary | ICD-10-CM | POA: Diagnosis not present

## 2022-03-04 DIAGNOSIS — I69354 Hemiplegia and hemiparesis following cerebral infarction affecting left non-dominant side: Secondary | ICD-10-CM | POA: Diagnosis not present

## 2022-03-04 DIAGNOSIS — M6389 Disorders of muscle in diseases classified elsewhere, multiple sites: Secondary | ICD-10-CM | POA: Diagnosis not present

## 2022-03-04 DIAGNOSIS — R41841 Cognitive communication deficit: Secondary | ICD-10-CM | POA: Diagnosis not present

## 2022-03-07 DIAGNOSIS — R1312 Dysphagia, oropharyngeal phase: Secondary | ICD-10-CM | POA: Diagnosis not present

## 2022-03-07 DIAGNOSIS — R293 Abnormal posture: Secondary | ICD-10-CM | POA: Diagnosis not present

## 2022-03-07 DIAGNOSIS — M6389 Disorders of muscle in diseases classified elsewhere, multiple sites: Secondary | ICD-10-CM | POA: Diagnosis not present

## 2022-03-07 DIAGNOSIS — R41840 Attention and concentration deficit: Secondary | ICD-10-CM | POA: Diagnosis not present

## 2022-03-07 DIAGNOSIS — I10 Essential (primary) hypertension: Secondary | ICD-10-CM | POA: Diagnosis not present

## 2022-03-07 DIAGNOSIS — I5022 Chronic systolic (congestive) heart failure: Secondary | ICD-10-CM | POA: Diagnosis not present

## 2022-03-07 DIAGNOSIS — I69812 Visuospatial deficit and spatial neglect following other cerebrovascular disease: Secondary | ICD-10-CM | POA: Diagnosis not present

## 2022-03-07 DIAGNOSIS — R41841 Cognitive communication deficit: Secondary | ICD-10-CM | POA: Diagnosis not present

## 2022-03-07 DIAGNOSIS — R278 Other lack of coordination: Secondary | ICD-10-CM | POA: Diagnosis not present

## 2022-03-07 DIAGNOSIS — S065X3S Traumatic subdural hemorrhage with loss of consciousness of 1 hour to 5 hours 59 minutes, sequela: Secondary | ICD-10-CM | POA: Diagnosis not present

## 2022-03-07 DIAGNOSIS — I69354 Hemiplegia and hemiparesis following cerebral infarction affecting left non-dominant side: Secondary | ICD-10-CM | POA: Diagnosis not present

## 2022-03-07 DIAGNOSIS — G4089 Other seizures: Secondary | ICD-10-CM | POA: Diagnosis not present

## 2022-03-07 DIAGNOSIS — H538 Other visual disturbances: Secondary | ICD-10-CM | POA: Diagnosis not present

## 2022-03-07 LAB — COMPREHENSIVE METABOLIC PANEL
Calcium: 9.3 (ref 8.7–10.7)
eGFR: 67

## 2022-03-07 LAB — BASIC METABOLIC PANEL
BUN: 34 — AB (ref 4–21)
CO2: 28 — AB (ref 13–22)
Chloride: 109 — AB (ref 99–108)
Creatinine: 1.1 (ref 0.6–1.3)
Glucose: 118
Potassium: 3.8 mEq/L (ref 3.5–5.1)
Sodium: 149 — AB (ref 137–147)

## 2022-03-08 DIAGNOSIS — R41841 Cognitive communication deficit: Secondary | ICD-10-CM | POA: Diagnosis not present

## 2022-03-08 DIAGNOSIS — I69354 Hemiplegia and hemiparesis following cerebral infarction affecting left non-dominant side: Secondary | ICD-10-CM | POA: Diagnosis not present

## 2022-03-08 DIAGNOSIS — I5022 Chronic systolic (congestive) heart failure: Secondary | ICD-10-CM | POA: Diagnosis not present

## 2022-03-08 DIAGNOSIS — S065X3S Traumatic subdural hemorrhage with loss of consciousness of 1 hour to 5 hours 59 minutes, sequela: Secondary | ICD-10-CM | POA: Diagnosis not present

## 2022-03-08 DIAGNOSIS — R1312 Dysphagia, oropharyngeal phase: Secondary | ICD-10-CM | POA: Diagnosis not present

## 2022-03-08 DIAGNOSIS — G4089 Other seizures: Secondary | ICD-10-CM | POA: Diagnosis not present

## 2022-03-09 DIAGNOSIS — I5022 Chronic systolic (congestive) heart failure: Secondary | ICD-10-CM | POA: Diagnosis not present

## 2022-03-09 DIAGNOSIS — S065X3S Traumatic subdural hemorrhage with loss of consciousness of 1 hour to 5 hours 59 minutes, sequela: Secondary | ICD-10-CM | POA: Diagnosis not present

## 2022-03-09 DIAGNOSIS — R1312 Dysphagia, oropharyngeal phase: Secondary | ICD-10-CM | POA: Diagnosis not present

## 2022-03-09 DIAGNOSIS — R41841 Cognitive communication deficit: Secondary | ICD-10-CM | POA: Diagnosis not present

## 2022-03-09 DIAGNOSIS — G4089 Other seizures: Secondary | ICD-10-CM | POA: Diagnosis not present

## 2022-03-09 DIAGNOSIS — I69354 Hemiplegia and hemiparesis following cerebral infarction affecting left non-dominant side: Secondary | ICD-10-CM | POA: Diagnosis not present

## 2022-03-10 ENCOUNTER — Encounter: Payer: Self-pay | Admitting: Adult Health

## 2022-03-10 ENCOUNTER — Non-Acute Institutional Stay (SKILLED_NURSING_FACILITY): Payer: Medicare Other | Admitting: Adult Health

## 2022-03-10 ENCOUNTER — Other Ambulatory Visit: Payer: Self-pay | Admitting: Adult Health

## 2022-03-10 DIAGNOSIS — E870 Hyperosmolality and hypernatremia: Secondary | ICD-10-CM

## 2022-03-10 DIAGNOSIS — I059 Rheumatic mitral valve disease, unspecified: Secondary | ICD-10-CM

## 2022-03-10 DIAGNOSIS — I38 Endocarditis, valve unspecified: Secondary | ICD-10-CM | POA: Diagnosis not present

## 2022-03-10 DIAGNOSIS — E118 Type 2 diabetes mellitus with unspecified complications: Secondary | ICD-10-CM

## 2022-03-10 DIAGNOSIS — S069X9D Unspecified intracranial injury with loss of consciousness of unspecified duration, subsequent encounter: Secondary | ICD-10-CM

## 2022-03-10 DIAGNOSIS — S065X3S Traumatic subdural hemorrhage with loss of consciousness of 1 hour to 5 hours 59 minutes, sequela: Secondary | ICD-10-CM | POA: Diagnosis not present

## 2022-03-10 DIAGNOSIS — G4089 Other seizures: Secondary | ICD-10-CM | POA: Diagnosis not present

## 2022-03-10 DIAGNOSIS — I480 Paroxysmal atrial fibrillation: Secondary | ICD-10-CM

## 2022-03-10 DIAGNOSIS — R1312 Dysphagia, oropharyngeal phase: Secondary | ICD-10-CM | POA: Diagnosis not present

## 2022-03-10 DIAGNOSIS — E785 Hyperlipidemia, unspecified: Secondary | ICD-10-CM | POA: Diagnosis not present

## 2022-03-10 DIAGNOSIS — I5022 Chronic systolic (congestive) heart failure: Secondary | ICD-10-CM | POA: Diagnosis not present

## 2022-03-10 DIAGNOSIS — R569 Unspecified convulsions: Secondary | ICD-10-CM | POA: Diagnosis not present

## 2022-03-10 DIAGNOSIS — R41841 Cognitive communication deficit: Secondary | ICD-10-CM | POA: Diagnosis not present

## 2022-03-10 DIAGNOSIS — I69354 Hemiplegia and hemiparesis following cerebral infarction affecting left non-dominant side: Secondary | ICD-10-CM | POA: Diagnosis not present

## 2022-03-10 DIAGNOSIS — T826XXD Infection and inflammatory reaction due to cardiac valve prosthesis, subsequent encounter: Secondary | ICD-10-CM | POA: Diagnosis not present

## 2022-03-10 MED ORDER — LACOSAMIDE 10 MG/ML PO SOLN: 75.0000 mg | Freq: Every day | ORAL | 1 refills | Status: DC

## 2022-03-10 NOTE — Progress Notes (Signed)
Location:  [place] Room Number: 127A Place of Service:  SNF [phone]) Provider: Royal Hawthorn NP  [name], MD  Patient Care Team: [name], MD as PCP - General (Internal Medicine) Larey Dresser, MD as PCP - Cardiology (Cardiology) [name], MD as Consulting Physician (Ophthalmology) [name], MD (Internal Medicine)  Extended Emergency Contact Information Primary Emergency Contact: [name] Address: [address]          [place], [place] [address] [place] Phone: [phone] Mobile Phone: [phone] Relation: Spouse Secondary Emergency Contact: [name], [place] [address] [name] Phone: [phone] Relation: Son  Code Status:  DNR Goals of care: Advanced Directive information    03/10/2022    3:45 PM  Advanced Directives  Does Patient Have a Medical Advance Directive? Yes  Type of Advance Directive Out of facility DNR (pink MOST or yellow form);Healthcare Power of Attorney  Does patient want to make changes to medical advance directive? No - Patient declined  Copy of [place] in Chart? Yes - validated most recent copy scanned in chart (See row information)     Chief Complaint  Patient presents with   Medical Management of Chronic Issues    Routine Visit    Immunizations    Discussed the need for covid vaccine    Quality Metric Gaps    Discussed the need for A1c, AWV, Diabetic kidney evaluation  Would not order urine microalbumin due to incontinence and state of pts health. Ordered A1C.   HPI:  Pt is a 82 y.o. male seen today for medical management of chronic diseases.    PMH includes syncope with fall in May of 2022, TBI with SDH with craniotomy, acute right MCA s/p thrombectomy, bioprosthetic mitral and aortic valve replacement, DM II, diastiolic CHF, BPH, and afib.  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  Recommendation made by cardiology to increase torsemide and maintain unless NA >150 and also to consider hospice due to valvular stenosis/not a surgical candidate.   No sob, weights trending down.  Wt Readings from Last 3 Encounters:  03/10/22 138 lb 12.8 oz (63 kg)  02/03/22 139 lb 9.6 oz (63.3 kg)  01/24/22 142 lb 9.6 oz (64.7 kg)   Seizures: no new since Nov. Wife declined increased dose of vimpat due to lethargy concerns.   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   Lesion of ulnar nerve    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    Other and unspecified hyperlipidemia    Pre-diabetes    Previous back surgery 1978, jan 2007   Psychosexual dysfunction with inhibited  sexual excitement    SOB (shortness of breath)    "with heavy exercise"   Stroke ([place]) 08/2013   "I WAS IN AFIB AND THREW A CLOT, THE EFFECTS WERE TRANSITORY AND DIDNT LAST BUT FOR 30 MINUTES"    Thoracic aortic aneurysm [facility])    Past Surgical History:  Procedure Laterality Date   AORTIC AND MITRAL VALVE REPLACEMENT     09/2004   CARDIOVERSION     3 times from 2004-2006   CARDIOVERSION N/A 09/26/2013   Procedure: CARDIOVERSION;  Surgeon: Larey Dresser, MD;  Location: [facility];  Service:  Cardiovascular;  Laterality: N/A;   CARDIOVERSION N/A 06/19/2014   Procedure: CARDIOVERSION;  Surgeon: Jerline Pain, MD;  Location: [facility];  Service: Cardiovascular;  Laterality: N/A;   CARDIOVERSION N/A 04/24/2017   Procedure: CARDIOVERSION;  Surgeon: Larey Dresser, MD;  Location: [place];  Service: Cardiovascular;  Laterality: N/A;   CARDIOVERSION N/A 06/08/2017   Procedure: CARDIOVERSION;  Surgeon: Pixie Casino, MD;  Location: [facility] ENDOSCOPY;  Service: Cardiovascular;  Laterality: N/A;   CARDIOVERSION N/A 08/24/2017   Procedure: CARDIOVERSION;  Surgeon: Skeet Latch, MD;  Location: [facility] ENDOSCOPY;  Service: Cardiovascular;  Laterality: N/A;   CARDIOVERSION N/A 02/14/2018   Procedure: CARDIOVERSION;  Surgeon: Larey Dresser, MD;  Location: [facility] ENDOSCOPY;  Service: Cardiovascular;  Laterality: N/A;   COLONOSCOPY     CRANIOTOMY N/A 07/28/2020   Procedure: CRANIOTOMY FOR EVACUATION OF SUBDURAL HEMATOMA;  Surgeon: Eustace Mewborn, MD;  Location: [place];  Service: Neurosurgery;  Laterality: N/A;   IR ANGIO VERTEBRAL SEL SUBCLAVIAN INNOMINATE UNI R MOD SED  08/07/2020   IR CT HEAD LTD  08/07/2020   IR PERCUTANEOUS ART THROMBECTOMY/INFUSION INTRACRANIAL INC DIAG ANGIO  08/07/2020   laminectomies     10/1975 and in 03/2005   RADIOLOGY WITH ANESTHESIA N/A 08/07/2020   Procedure: IR WITH ANESTHESIA;  Surgeon: Luanne Bras, MD;  Location: [place];  Service: Radiology;  Laterality: N/A;   TEE WITHOUT CARDIOVERSION N/A 12/18/2020   Procedure: TRANSESOPHAGEAL ECHOCARDIOGRAM (TEE);  Surgeon: Larey Dresser, MD;  Location: [facility] ENDOSCOPY;  Service: Cardiovascular;  Laterality: N/A;   [place] Right 04/03/2018   Procedure: TOTAL HIP ARTHROPLASTY ANTERIOR APPROACH;  Surgeon: Paralee Cancel, MD;  Location: [facility];  Service: Orthopedics;  Laterality: Right;  70 mins    Allergies  Allergen Reactions   Naproxen Hives   No [facility] Food Allergies Other (See  Comments)    Scallops - distress, nausea and vomitting    Outpatient Encounter Medications as of 03/10/2022  Medication Sig   acetaminophen (TYLENOL) 325 MG tablet Take 650 mg by mouth as needed for mild pain.   Amantadine HCl 100 MG tablet Take 100 mg by mouth 2 (two) times daily.   amoxicillin (AMOXIL) 500 MG capsule Take 500 mg by mouth 2 (two) times daily.   apixaban (ELIQUIS) 5 MG TABS tablet Take 1 tablet (5 mg total) by mouth 2 (two) times daily.   Artificial Saliva (BIOTENE MOISTURIZING MOUTH MT) Use as directed 2 sprays in the mouth or throat in the morning, at noon, and at bedtime.   atorvastatin (LIPITOR) 80 MG tablet Take 1 tablet (80 mg total) by mouth daily.   bisacodyl (DULCOLAX) 10 MG suppository Place 1 suppository (10 mg total) rectally daily as needed for moderate constipation or mild constipation.   clobetasol (TEMOVATE) 0.05 % external solution Apply 1 application  topically as needed (  itching).   diazePAM, 15 MG Dose, (VALTOCO 15 MG DOSE) 2 x 7.5 MG/0.1ML LQPK Place 7.5 mg into each nostril (total of 15 mg) for grand mal seizure lasting over 5 minutes. Can repeat once in 4 hours. Do Not use more than twice in 24 hours   dofetilide (TIKOSYN) 125 MCG capsule Take 3 capsules (375 mcg total) by mouth 2 (two) times daily.   famotidine (PEPCID) 20 MG tablet Take 20 mg by mouth at bedtime as needed for indigestion.   finasteride (PROSCAR) 5 MG tablet Take 1 tablet (5 mg total) by mouth daily.   Glucerna (GLUCERNA) LIQD Take 237 mLs by mouth daily.   hydrocortisone 2.5 % cream Apply 1 application. topically daily as needed (itching).   lacosamide (VIMPAT) 10 MG/ML oral solution Take 5 mLs (50 mg total) by mouth in the morning.   lacosamide (VIMPAT) 10 MG/ML oral solution Take 7.5 mLs (75 mg total) by mouth at bedtime.   levalbuterol (XOPENEX) 0.63 MG/3ML nebulizer solution Take 0.63 mg by nebulization every 6 (six) hours as needed for wheezing or shortness of breath.   magnesium  gluconate (MAGONATE) 500 MG tablet Take 1 tablet (500 mg total) by mouth daily.   melatonin 5 MG TABS Take 5 mg by mouth at bedtime.   methenamine (MANDELAMINE) 1 g tablet Take 1,000 mg by mouth 2 (two) times daily.   methylphenidate (RITALIN) 5 MG tablet Take 1 tablet (5 mg total) by mouth 2 (two) times daily. Take at 7 am and 12 pm   Multiple Vitamin (MULTIVITAMIN WITH MINERALS) TABS tablet Take 1 tablet by mouth daily.   omeprazole (PRILOSEC) 20 MG capsule Take 20 mg by mouth daily.   Polyethyl Glycol-Propyl Glycol (SYSTANE) 0.4-0.3 % SOLN Place 2 drops into both eyes daily.   polyethylene glycol (MIRALAX / GLYCOLAX) 17 g packet Take 17 g by mouth every other day.   potassium chloride (KLOR-CON) 10 MEQ tablet Take 2 tablets (20 mEq total) by mouth daily.   senna-docusate (SENOKOT-S) 8.6-50 MG tablet Take 1 tablet by mouth at bedtime as needed.   Torsemide 40 MG TABS Take 40 mg by mouth daily.   [DISCONTINUED] amLODipine (NORVASC) 5 MG tablet Take 0.5 tablets (2.5 mg total) by mouth daily.   No facility-administered encounter medications on file as of 03/10/2022.    Review of Systems  Unable to perform ROS: Patient nonverbal    Immunization History  Administered Date(s) Administered   Fluad Quad(high Dose 65+) 12/06/2018, 12/09/2020, 12/10/2021   Influenza Whole 12/25/2008, 01/05/2010, 12/30/2011   Influenza, High Dose Seasonal PF 12/14/2016   Influenza,inj,Quad PF,6+ Mos 12/19/2013   Influenza,inj,quad, With Preservative 11/22/2017   Influenza-Unspecified 12/14/2015, 12/24/2019, 12/10/2021   Moderna Covid-19 Vaccine Bivalent Booster 13yr & up 01/12/2021   Moderna SARS-COV2 Booster Vaccination 01/02/2020, 06/26/2020   Moderna Sars-Covid-2 Vaccination 04/08/2019, 05/05/2019   Pneumococcal Conjugate-13 12/19/2013   Pneumococcal Polysaccharide-23 09/24/2007, 05/22/2017   Td 12/25/2008   Td (Adult), 2 Lf Tetanus Toxid, Preservative Free 12/25/2008   Tdap 03/22/2018   Zoster  Recombinat (Shingrix) 07/16/2019, 10/28/2019   Zoster, Live 09/24/2007   Pertinent  Health Maintenance Due  Topic Date Due   HEMOGLOBIN A1C  01/05/2022   OPHTHALMOLOGY EXAM  10/06/2022 (Originally 10/04/1950)   FOOT EXAM  10/23/2022   INFLUENZA VACCINE  Completed   COLONOSCOPY (Pts 45-444yrInsurance coverage will need to be confirmed)  Discontinued      08/11/2021    8:00 AM 08/11/2021    7:30 PM 08/12/2021  8:00 AM 10/13/2021    2:32 PM 02/02/2022   11:24 AM  Fall Risk  Falls in the past year?    0 0  Was there an injury with Fall?     0  Fall Risk Category Calculator     0  Fall Risk Category     Low  Patient Fall Risk Level High fall risk High fall risk High fall risk  High fall risk  Patient at Risk for Falls Due to     Impaired mobility  Fall risk Follow up     Falls evaluation completed   Functional Status Survey:    Vitals:   03/10/22 1539  BP: 121/72  Pulse: 70  Resp: 18  Temp: (!) 96.7 F (35.9 C)  TempSrc: Temporal  SpO2: 100%  Weight: 138 lb 12.8 oz (63 kg)  Height: '5\' 7"'$  (1.702 m)   Body mass index is 21.74 kg/m. Physical Exam Vitals and nursing note reviewed.  Constitutional:      General: He is not in acute distress.    Appearance: He is not diaphoretic.  HENT:     Head: Normocephalic and atraumatic.     Mouth/Throat:     Mouth: Mucous membranes are moist.     Pharynx: Oropharynx is clear.  Eyes:     Conjunctiva/sclera: Conjunctivae normal.     Pupils: Pupils are equal, round, and reactive to light.  Neck:     Thyroid: No thyromegaly.     Vascular: No JVD.     Trachea: No tracheal deviation.  Cardiovascular:     Rate and Rhythm: Normal rate and regular rhythm.     Heart sounds: No murmur heard. Pulmonary:     Effort: Pulmonary effort is normal. No respiratory distress.     Breath sounds: Normal breath sounds. No wheezing.  Abdominal:     General: Bowel sounds are normal. There is no distension.     Palpations: Abdomen is soft.      Tenderness: There is no abdominal tenderness.  Musculoskeletal:     Right lower leg: No edema.     Left lower leg: No edema.  Lymphadenopathy:     Cervical: No cervical adenopathy.  Skin:    General: Skin is warm and dry.  Neurological:     Mental Status: He is alert.     Comments: Left sided weakness and hemiplegia.      Labs reviewed: Recent Labs    08/11/21 0408 08/12/21 0107 08/16/21 0000 12/13/21 1157 12/14/21 0000 01/03/22 1322 01/10/22 0000 01/13/22 0000 01/20/22 0000 02/02/22 1202  NA 140 142   < >  --    < > 144   < > 147 143 145  K 3.6 3.9   < >  --    < > 3.7   < > 3.6 3.5 3.6  CL 109 113*   < >  --    < > 108   < > 106 104 107  CO2 24 23   < >  --    < > 25   < > 27* 27* 28  GLUCOSE 120* 143*  --   --   --  120*  --   --   --  126*  BUN 17 17   < >  --    < > 27*   < > 37* 28* 32*  CREATININE 0.81 0.83   < >  --    < > 1.18   < >  1.2 0.9 1.14  CALCIUM 8.6* 8.7*   < >  --    < > 9.1   < > 9.6 9.7 9.2  MG 1.9 2.0  --  2.4  --   --   --   --   --   --    < > = values in this interval not displayed.   Recent Labs    08/11/21 0408 09/07/21 0000 09/08/21 0000 01/03/22 1322 02/02/22 1202  AST '27 24 24 25 22  '$ ALT '23 18 18 20 23  '$ ALKPHOS 80 105 105 100  --   BILITOT 0.8  --   --  1.1 0.9  PROT 5.8*  --   --  7.4 7.1  ALBUMIN 3.1* 3.5 3.5 3.6  --    Recent Labs    08/10/21 0937 08/10/21 0957 08/11/21 0408 08/12/21 0107 09/07/21 0000 12/06/21 0000 01/20/22 0000 02/02/22 1202  WBC 16.1*  --  9.1 10.2   < > 8.1 10.2 9.4  NEUTROABS 14.3*  --   --  7.8*  --   --   --  6,110  HGB 10.0*   < > 9.4* 9.4*   < > 9.2* 9.5* 9.0*  HCT 31.6*   < > 28.7* 29.8*   < > 28* 30* 28.3*  MCV 96.3  --  92.0 94.3  --   --   --  86.0  PLT 238  --  218 227   < > 338 253 261   < > = values in this interval not displayed.   Lab Results  Component Value Date   TSH 1.773 01/24/2021   Lab Results  Component Value Date   HGBA1C 5.7 07/05/2021   Lab Results  Component  Value Date   CHOL 110 07/05/2021   HDL 43 07/05/2021   LDLCALC 57 07/05/2021   TRIG 51 07/05/2021   CHOLHDL 2.8 08/08/2020    Significant Diagnostic Results in last 30 days:  No results found.  Assessment/Plan  1. 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 Chronic systolic CHF (congestive heart failure) (HCC) Has stabilized over the past few months after a period of significant instability and concern for end of life Hospice had been recommended. Family declined.  Has DNR.  Continue torsemide Followed by Loralie Champagne.   4. PAF (paroxysmal atrial fibrillation) (HCC) Rate has been controlled and regular.  Not on BB due to first degree block per cardiology On tikosyn  5. Mitral valve disorder S/p severe Now with severe stenosis on echo Not a surgical candidate.   6. Seizure ([place]) No new  Continue vimpat.   7. Hypernatremia NA 149 03/07/22 This is a chronic issue but he does not tolerate being off torsemide.  Try to keep NA <150 Encourage oral fluid  Repeat BMP weekly   8. Hyperlipidemia with target LDL less than 130 Lab Results  Component Value Date   LDLCALC 57 07/05/2021  ON lipitor    9. Controlled type 2 diabetes mellitus with complication, without long-term current use of insulin (HCC) Needs A1C Diet controlled.   10. Traumatic brain injury with loss of consciousness, subsequent encounter Currently on amantadine and Ritalin per PMR Has periods of wakefulness and periods of lethargy Able to communicate  with a few words and hand signals.    Family/ staff Communication: nurse  Labs/tests ordered:  CBC BMP A1C 03/14/22

## 2022-03-10 NOTE — Progress Notes (Signed)
abstraction

## 2022-03-11 ENCOUNTER — Encounter: Payer: Self-pay | Admitting: Adult Health

## 2022-03-11 DIAGNOSIS — R41841 Cognitive communication deficit: Secondary | ICD-10-CM | POA: Diagnosis not present

## 2022-03-11 DIAGNOSIS — I69354 Hemiplegia and hemiparesis following cerebral infarction affecting left non-dominant side: Secondary | ICD-10-CM | POA: Diagnosis not present

## 2022-03-11 DIAGNOSIS — R1312 Dysphagia, oropharyngeal phase: Secondary | ICD-10-CM | POA: Diagnosis not present

## 2022-03-11 DIAGNOSIS — I5022 Chronic systolic (congestive) heart failure: Secondary | ICD-10-CM | POA: Diagnosis not present

## 2022-03-11 DIAGNOSIS — S065X3S Traumatic subdural hemorrhage with loss of consciousness of 1 hour to 5 hours 59 minutes, sequela: Secondary | ICD-10-CM | POA: Diagnosis not present

## 2022-03-11 DIAGNOSIS — G4089 Other seizures: Secondary | ICD-10-CM | POA: Diagnosis not present

## 2022-03-14 DIAGNOSIS — I69354 Hemiplegia and hemiparesis following cerebral infarction affecting left non-dominant side: Secondary | ICD-10-CM | POA: Diagnosis not present

## 2022-03-14 DIAGNOSIS — R1312 Dysphagia, oropharyngeal phase: Secondary | ICD-10-CM | POA: Diagnosis not present

## 2022-03-14 DIAGNOSIS — S065X3S Traumatic subdural hemorrhage with loss of consciousness of 1 hour to 5 hours 59 minutes, sequela: Secondary | ICD-10-CM | POA: Diagnosis not present

## 2022-03-14 DIAGNOSIS — I5033 Acute on chronic diastolic (congestive) heart failure: Secondary | ICD-10-CM | POA: Diagnosis not present

## 2022-03-14 DIAGNOSIS — I5022 Chronic systolic (congestive) heart failure: Secondary | ICD-10-CM | POA: Diagnosis not present

## 2022-03-14 DIAGNOSIS — G4089 Other seizures: Secondary | ICD-10-CM | POA: Diagnosis not present

## 2022-03-14 DIAGNOSIS — R41841 Cognitive communication deficit: Secondary | ICD-10-CM | POA: Diagnosis not present

## 2022-03-14 DIAGNOSIS — E118 Type 2 diabetes mellitus with unspecified complications: Secondary | ICD-10-CM | POA: Diagnosis not present

## 2022-03-14 DIAGNOSIS — D509 Iron deficiency anemia, unspecified: Secondary | ICD-10-CM | POA: Diagnosis not present

## 2022-03-14 LAB — CBC AND DIFFERENTIAL
HCT: 32 — AB (ref 41–53)
Hemoglobin: 10.3 — AB (ref 13.5–17.5)
Platelets: 240 10*3/uL (ref 150–400)
WBC: 8.1

## 2022-03-16 DIAGNOSIS — I5022 Chronic systolic (congestive) heart failure: Secondary | ICD-10-CM | POA: Diagnosis not present

## 2022-03-16 DIAGNOSIS — G4089 Other seizures: Secondary | ICD-10-CM | POA: Diagnosis not present

## 2022-03-16 DIAGNOSIS — R41841 Cognitive communication deficit: Secondary | ICD-10-CM | POA: Diagnosis not present

## 2022-03-16 DIAGNOSIS — R1312 Dysphagia, oropharyngeal phase: Secondary | ICD-10-CM | POA: Diagnosis not present

## 2022-03-16 DIAGNOSIS — I69354 Hemiplegia and hemiparesis following cerebral infarction affecting left non-dominant side: Secondary | ICD-10-CM | POA: Diagnosis not present

## 2022-03-16 DIAGNOSIS — S065X3S Traumatic subdural hemorrhage with loss of consciousness of 1 hour to 5 hours 59 minutes, sequela: Secondary | ICD-10-CM | POA: Diagnosis not present

## 2022-03-17 DIAGNOSIS — I5022 Chronic systolic (congestive) heart failure: Secondary | ICD-10-CM | POA: Diagnosis not present

## 2022-03-17 DIAGNOSIS — S065X3S Traumatic subdural hemorrhage with loss of consciousness of 1 hour to 5 hours 59 minutes, sequela: Secondary | ICD-10-CM | POA: Diagnosis not present

## 2022-03-17 DIAGNOSIS — I69354 Hemiplegia and hemiparesis following cerebral infarction affecting left non-dominant side: Secondary | ICD-10-CM | POA: Diagnosis not present

## 2022-03-17 DIAGNOSIS — R41841 Cognitive communication deficit: Secondary | ICD-10-CM | POA: Diagnosis not present

## 2022-03-17 DIAGNOSIS — R1312 Dysphagia, oropharyngeal phase: Secondary | ICD-10-CM | POA: Diagnosis not present

## 2022-03-17 DIAGNOSIS — G4089 Other seizures: Secondary | ICD-10-CM | POA: Diagnosis not present

## 2022-03-18 DIAGNOSIS — R41841 Cognitive communication deficit: Secondary | ICD-10-CM | POA: Diagnosis not present

## 2022-03-18 DIAGNOSIS — R1312 Dysphagia, oropharyngeal phase: Secondary | ICD-10-CM | POA: Diagnosis not present

## 2022-03-18 DIAGNOSIS — S065X3S Traumatic subdural hemorrhage with loss of consciousness of 1 hour to 5 hours 59 minutes, sequela: Secondary | ICD-10-CM | POA: Diagnosis not present

## 2022-03-18 DIAGNOSIS — I5022 Chronic systolic (congestive) heart failure: Secondary | ICD-10-CM | POA: Diagnosis not present

## 2022-03-18 DIAGNOSIS — I69354 Hemiplegia and hemiparesis following cerebral infarction affecting left non-dominant side: Secondary | ICD-10-CM | POA: Diagnosis not present

## 2022-03-18 DIAGNOSIS — G4089 Other seizures: Secondary | ICD-10-CM | POA: Diagnosis not present

## 2022-03-21 ENCOUNTER — Other Ambulatory Visit: Payer: Self-pay | Admitting: Internal Medicine

## 2022-03-21 DIAGNOSIS — G4089 Other seizures: Secondary | ICD-10-CM | POA: Diagnosis not present

## 2022-03-21 DIAGNOSIS — S065X3S Traumatic subdural hemorrhage with loss of consciousness of 1 hour to 5 hours 59 minutes, sequela: Secondary | ICD-10-CM | POA: Diagnosis not present

## 2022-03-21 DIAGNOSIS — I5022 Chronic systolic (congestive) heart failure: Secondary | ICD-10-CM | POA: Diagnosis not present

## 2022-03-21 DIAGNOSIS — R1312 Dysphagia, oropharyngeal phase: Secondary | ICD-10-CM | POA: Diagnosis not present

## 2022-03-21 DIAGNOSIS — I69354 Hemiplegia and hemiparesis following cerebral infarction affecting left non-dominant side: Secondary | ICD-10-CM | POA: Diagnosis not present

## 2022-03-21 DIAGNOSIS — I5033 Acute on chronic diastolic (congestive) heart failure: Secondary | ICD-10-CM | POA: Diagnosis not present

## 2022-03-21 DIAGNOSIS — R41841 Cognitive communication deficit: Secondary | ICD-10-CM | POA: Diagnosis not present

## 2022-03-21 MED ORDER — METHYLPHENIDATE HCL 5 MG PO TABS: 5.0000 mg | ORAL_TABLET | Freq: Two times a day (BID) | ORAL | 0 refills | Status: DC

## 2022-03-22 DIAGNOSIS — I5022 Chronic systolic (congestive) heart failure: Secondary | ICD-10-CM | POA: Diagnosis not present

## 2022-03-22 DIAGNOSIS — R1312 Dysphagia, oropharyngeal phase: Secondary | ICD-10-CM | POA: Diagnosis not present

## 2022-03-22 DIAGNOSIS — I69354 Hemiplegia and hemiparesis following cerebral infarction affecting left non-dominant side: Secondary | ICD-10-CM | POA: Diagnosis not present

## 2022-03-22 DIAGNOSIS — G4089 Other seizures: Secondary | ICD-10-CM | POA: Diagnosis not present

## 2022-03-22 DIAGNOSIS — R41841 Cognitive communication deficit: Secondary | ICD-10-CM | POA: Diagnosis not present

## 2022-03-22 DIAGNOSIS — S065X3S Traumatic subdural hemorrhage with loss of consciousness of 1 hour to 5 hours 59 minutes, sequela: Secondary | ICD-10-CM | POA: Diagnosis not present

## 2022-03-23 DIAGNOSIS — G4089 Other seizures: Secondary | ICD-10-CM | POA: Diagnosis not present

## 2022-03-23 DIAGNOSIS — S065X3S Traumatic subdural hemorrhage with loss of consciousness of 1 hour to 5 hours 59 minutes, sequela: Secondary | ICD-10-CM | POA: Diagnosis not present

## 2022-03-23 DIAGNOSIS — R1312 Dysphagia, oropharyngeal phase: Secondary | ICD-10-CM | POA: Diagnosis not present

## 2022-03-23 DIAGNOSIS — R41841 Cognitive communication deficit: Secondary | ICD-10-CM | POA: Diagnosis not present

## 2022-03-23 DIAGNOSIS — I5022 Chronic systolic (congestive) heart failure: Secondary | ICD-10-CM | POA: Diagnosis not present

## 2022-03-23 DIAGNOSIS — I69354 Hemiplegia and hemiparesis following cerebral infarction affecting left non-dominant side: Secondary | ICD-10-CM | POA: Diagnosis not present

## 2022-03-24 DIAGNOSIS — R41841 Cognitive communication deficit: Secondary | ICD-10-CM | POA: Diagnosis not present

## 2022-03-24 DIAGNOSIS — G4089 Other seizures: Secondary | ICD-10-CM | POA: Diagnosis not present

## 2022-03-24 DIAGNOSIS — S065X3S Traumatic subdural hemorrhage with loss of consciousness of 1 hour to 5 hours 59 minutes, sequela: Secondary | ICD-10-CM | POA: Diagnosis not present

## 2022-03-24 DIAGNOSIS — I5022 Chronic systolic (congestive) heart failure: Secondary | ICD-10-CM | POA: Diagnosis not present

## 2022-03-24 DIAGNOSIS — R1312 Dysphagia, oropharyngeal phase: Secondary | ICD-10-CM | POA: Diagnosis not present

## 2022-03-24 DIAGNOSIS — I69354 Hemiplegia and hemiparesis following cerebral infarction affecting left non-dominant side: Secondary | ICD-10-CM | POA: Diagnosis not present

## 2022-03-25 DIAGNOSIS — S065X3S Traumatic subdural hemorrhage with loss of consciousness of 1 hour to 5 hours 59 minutes, sequela: Secondary | ICD-10-CM | POA: Diagnosis not present

## 2022-03-25 DIAGNOSIS — R41841 Cognitive communication deficit: Secondary | ICD-10-CM | POA: Diagnosis not present

## 2022-03-25 DIAGNOSIS — R1312 Dysphagia, oropharyngeal phase: Secondary | ICD-10-CM | POA: Diagnosis not present

## 2022-03-25 DIAGNOSIS — I69354 Hemiplegia and hemiparesis following cerebral infarction affecting left non-dominant side: Secondary | ICD-10-CM | POA: Diagnosis not present

## 2022-03-25 DIAGNOSIS — G4089 Other seizures: Secondary | ICD-10-CM | POA: Diagnosis not present

## 2022-03-25 DIAGNOSIS — I5022 Chronic systolic (congestive) heart failure: Secondary | ICD-10-CM | POA: Diagnosis not present

## 2022-03-28 DIAGNOSIS — I69354 Hemiplegia and hemiparesis following cerebral infarction affecting left non-dominant side: Secondary | ICD-10-CM | POA: Diagnosis not present

## 2022-03-28 DIAGNOSIS — S065X3S Traumatic subdural hemorrhage with loss of consciousness of 1 hour to 5 hours 59 minutes, sequela: Secondary | ICD-10-CM | POA: Diagnosis not present

## 2022-03-28 DIAGNOSIS — G4089 Other seizures: Secondary | ICD-10-CM | POA: Diagnosis not present

## 2022-03-28 DIAGNOSIS — R41841 Cognitive communication deficit: Secondary | ICD-10-CM | POA: Diagnosis not present

## 2022-03-28 DIAGNOSIS — R1312 Dysphagia, oropharyngeal phase: Secondary | ICD-10-CM | POA: Diagnosis not present

## 2022-03-28 DIAGNOSIS — I5033 Acute on chronic diastolic (congestive) heart failure: Secondary | ICD-10-CM | POA: Diagnosis not present

## 2022-03-28 DIAGNOSIS — I5022 Chronic systolic (congestive) heart failure: Secondary | ICD-10-CM | POA: Diagnosis not present

## 2022-03-29 ENCOUNTER — Other Ambulatory Visit: Payer: Self-pay | Admitting: Orthopedic Surgery

## 2022-03-29 DIAGNOSIS — I69354 Hemiplegia and hemiparesis following cerebral infarction affecting left non-dominant side: Secondary | ICD-10-CM | POA: Diagnosis not present

## 2022-03-29 DIAGNOSIS — I5022 Chronic systolic (congestive) heart failure: Secondary | ICD-10-CM | POA: Diagnosis not present

## 2022-03-29 DIAGNOSIS — R569 Unspecified convulsions: Secondary | ICD-10-CM

## 2022-03-29 DIAGNOSIS — G4089 Other seizures: Secondary | ICD-10-CM | POA: Diagnosis not present

## 2022-03-29 DIAGNOSIS — S065X3S Traumatic subdural hemorrhage with loss of consciousness of 1 hour to 5 hours 59 minutes, sequela: Secondary | ICD-10-CM | POA: Diagnosis not present

## 2022-03-29 DIAGNOSIS — R1312 Dysphagia, oropharyngeal phase: Secondary | ICD-10-CM | POA: Diagnosis not present

## 2022-03-29 DIAGNOSIS — R41841 Cognitive communication deficit: Secondary | ICD-10-CM | POA: Diagnosis not present

## 2022-03-29 MED ORDER — LACOSAMIDE 10 MG/ML PO SOLN: 75.0000 mg | Freq: Every day | ORAL | 3 refills | Status: DC

## 2022-03-29 MED ORDER — LACOSAMIDE 10 MG/ML PO SOLN: 50.0000 mg | Freq: Every morning | ORAL | 1 refills | Status: DC

## 2022-03-30 DIAGNOSIS — R1312 Dysphagia, oropharyngeal phase: Secondary | ICD-10-CM | POA: Diagnosis not present

## 2022-03-30 DIAGNOSIS — R41841 Cognitive communication deficit: Secondary | ICD-10-CM | POA: Diagnosis not present

## 2022-03-30 DIAGNOSIS — S065X3S Traumatic subdural hemorrhage with loss of consciousness of 1 hour to 5 hours 59 minutes, sequela: Secondary | ICD-10-CM | POA: Diagnosis not present

## 2022-03-30 DIAGNOSIS — I5022 Chronic systolic (congestive) heart failure: Secondary | ICD-10-CM | POA: Diagnosis not present

## 2022-03-30 DIAGNOSIS — I69354 Hemiplegia and hemiparesis following cerebral infarction affecting left non-dominant side: Secondary | ICD-10-CM | POA: Diagnosis not present

## 2022-03-30 DIAGNOSIS — G4089 Other seizures: Secondary | ICD-10-CM | POA: Diagnosis not present

## 2022-03-31 DIAGNOSIS — S065X3S Traumatic subdural hemorrhage with loss of consciousness of 1 hour to 5 hours 59 minutes, sequela: Secondary | ICD-10-CM | POA: Diagnosis not present

## 2022-03-31 DIAGNOSIS — G4089 Other seizures: Secondary | ICD-10-CM | POA: Diagnosis not present

## 2022-03-31 DIAGNOSIS — R1312 Dysphagia, oropharyngeal phase: Secondary | ICD-10-CM | POA: Diagnosis not present

## 2022-03-31 DIAGNOSIS — I5022 Chronic systolic (congestive) heart failure: Secondary | ICD-10-CM | POA: Diagnosis not present

## 2022-03-31 DIAGNOSIS — I69354 Hemiplegia and hemiparesis following cerebral infarction affecting left non-dominant side: Secondary | ICD-10-CM | POA: Diagnosis not present

## 2022-03-31 DIAGNOSIS — R41841 Cognitive communication deficit: Secondary | ICD-10-CM | POA: Diagnosis not present

## 2022-04-01 DIAGNOSIS — S065X3S Traumatic subdural hemorrhage with loss of consciousness of 1 hour to 5 hours 59 minutes, sequela: Secondary | ICD-10-CM | POA: Diagnosis not present

## 2022-04-01 DIAGNOSIS — I69354 Hemiplegia and hemiparesis following cerebral infarction affecting left non-dominant side: Secondary | ICD-10-CM | POA: Diagnosis not present

## 2022-04-01 DIAGNOSIS — R1312 Dysphagia, oropharyngeal phase: Secondary | ICD-10-CM | POA: Diagnosis not present

## 2022-04-01 DIAGNOSIS — R41841 Cognitive communication deficit: Secondary | ICD-10-CM | POA: Diagnosis not present

## 2022-04-01 DIAGNOSIS — I5022 Chronic systolic (congestive) heart failure: Secondary | ICD-10-CM | POA: Diagnosis not present

## 2022-04-01 DIAGNOSIS — G4089 Other seizures: Secondary | ICD-10-CM | POA: Diagnosis not present

## 2022-04-04 DIAGNOSIS — I5033 Acute on chronic diastolic (congestive) heart failure: Secondary | ICD-10-CM | POA: Diagnosis not present

## 2022-04-04 DIAGNOSIS — S065X3S Traumatic subdural hemorrhage with loss of consciousness of 1 hour to 5 hours 59 minutes, sequela: Secondary | ICD-10-CM | POA: Diagnosis not present

## 2022-04-04 DIAGNOSIS — R1312 Dysphagia, oropharyngeal phase: Secondary | ICD-10-CM | POA: Diagnosis not present

## 2022-04-04 DIAGNOSIS — I5022 Chronic systolic (congestive) heart failure: Secondary | ICD-10-CM | POA: Diagnosis not present

## 2022-04-04 DIAGNOSIS — R41841 Cognitive communication deficit: Secondary | ICD-10-CM | POA: Diagnosis not present

## 2022-04-04 DIAGNOSIS — G4089 Other seizures: Secondary | ICD-10-CM | POA: Diagnosis not present

## 2022-04-04 DIAGNOSIS — I69354 Hemiplegia and hemiparesis following cerebral infarction affecting left non-dominant side: Secondary | ICD-10-CM | POA: Diagnosis not present

## 2022-04-05 DIAGNOSIS — R41841 Cognitive communication deficit: Secondary | ICD-10-CM | POA: Diagnosis not present

## 2022-04-05 DIAGNOSIS — I69354 Hemiplegia and hemiparesis following cerebral infarction affecting left non-dominant side: Secondary | ICD-10-CM | POA: Diagnosis not present

## 2022-04-05 DIAGNOSIS — S065X3S Traumatic subdural hemorrhage with loss of consciousness of 1 hour to 5 hours 59 minutes, sequela: Secondary | ICD-10-CM | POA: Diagnosis not present

## 2022-04-05 DIAGNOSIS — I5022 Chronic systolic (congestive) heart failure: Secondary | ICD-10-CM | POA: Diagnosis not present

## 2022-04-05 DIAGNOSIS — R1312 Dysphagia, oropharyngeal phase: Secondary | ICD-10-CM | POA: Diagnosis not present

## 2022-04-05 DIAGNOSIS — G4089 Other seizures: Secondary | ICD-10-CM | POA: Diagnosis not present

## 2022-04-06 DIAGNOSIS — R1312 Dysphagia, oropharyngeal phase: Secondary | ICD-10-CM | POA: Diagnosis not present

## 2022-04-06 DIAGNOSIS — G4089 Other seizures: Secondary | ICD-10-CM | POA: Diagnosis not present

## 2022-04-06 DIAGNOSIS — S065X3S Traumatic subdural hemorrhage with loss of consciousness of 1 hour to 5 hours 59 minutes, sequela: Secondary | ICD-10-CM | POA: Diagnosis not present

## 2022-04-06 DIAGNOSIS — R41841 Cognitive communication deficit: Secondary | ICD-10-CM | POA: Diagnosis not present

## 2022-04-06 DIAGNOSIS — I5022 Chronic systolic (congestive) heart failure: Secondary | ICD-10-CM | POA: Diagnosis not present

## 2022-04-06 DIAGNOSIS — I69354 Hemiplegia and hemiparesis following cerebral infarction affecting left non-dominant side: Secondary | ICD-10-CM | POA: Diagnosis not present

## 2022-04-07 DIAGNOSIS — I5022 Chronic systolic (congestive) heart failure: Secondary | ICD-10-CM | POA: Diagnosis not present

## 2022-04-07 DIAGNOSIS — I69812 Visuospatial deficit and spatial neglect following other cerebrovascular disease: Secondary | ICD-10-CM | POA: Diagnosis not present

## 2022-04-07 DIAGNOSIS — R293 Abnormal posture: Secondary | ICD-10-CM | POA: Diagnosis not present

## 2022-04-07 DIAGNOSIS — R278 Other lack of coordination: Secondary | ICD-10-CM | POA: Diagnosis not present

## 2022-04-07 DIAGNOSIS — S065X3S Traumatic subdural hemorrhage with loss of consciousness of 1 hour to 5 hours 59 minutes, sequela: Secondary | ICD-10-CM | POA: Diagnosis not present

## 2022-04-07 DIAGNOSIS — H538 Other visual disturbances: Secondary | ICD-10-CM | POA: Diagnosis not present

## 2022-04-07 DIAGNOSIS — I69354 Hemiplegia and hemiparesis following cerebral infarction affecting left non-dominant side: Secondary | ICD-10-CM | POA: Diagnosis not present

## 2022-04-07 DIAGNOSIS — M6389 Disorders of muscle in diseases classified elsewhere, multiple sites: Secondary | ICD-10-CM | POA: Diagnosis not present

## 2022-04-07 DIAGNOSIS — G4089 Other seizures: Secondary | ICD-10-CM | POA: Diagnosis not present

## 2022-04-07 DIAGNOSIS — R1312 Dysphagia, oropharyngeal phase: Secondary | ICD-10-CM | POA: Diagnosis not present

## 2022-04-07 DIAGNOSIS — R41840 Attention and concentration deficit: Secondary | ICD-10-CM | POA: Diagnosis not present

## 2022-04-07 DIAGNOSIS — R41841 Cognitive communication deficit: Secondary | ICD-10-CM | POA: Diagnosis not present

## 2022-04-08 ENCOUNTER — Non-Acute Institutional Stay (SKILLED_NURSING_FACILITY): Payer: Medicare Other | Admitting: Adult Health

## 2022-04-08 ENCOUNTER — Encounter: Payer: Self-pay | Admitting: Adult Health

## 2022-04-08 DIAGNOSIS — G4089 Other seizures: Secondary | ICD-10-CM | POA: Diagnosis not present

## 2022-04-08 DIAGNOSIS — R41841 Cognitive communication deficit: Secondary | ICD-10-CM | POA: Diagnosis not present

## 2022-04-08 DIAGNOSIS — N390 Urinary tract infection, site not specified: Secondary | ICD-10-CM

## 2022-04-08 DIAGNOSIS — N400 Enlarged prostate without lower urinary tract symptoms: Secondary | ICD-10-CM | POA: Diagnosis not present

## 2022-04-08 DIAGNOSIS — R1312 Dysphagia, oropharyngeal phase: Secondary | ICD-10-CM | POA: Diagnosis not present

## 2022-04-08 DIAGNOSIS — I69354 Hemiplegia and hemiparesis following cerebral infarction affecting left non-dominant side: Secondary | ICD-10-CM | POA: Diagnosis not present

## 2022-04-08 DIAGNOSIS — I5022 Chronic systolic (congestive) heart failure: Secondary | ICD-10-CM | POA: Diagnosis not present

## 2022-04-08 DIAGNOSIS — S065X3S Traumatic subdural hemorrhage with loss of consciousness of 1 hour to 5 hours 59 minutes, sequela: Secondary | ICD-10-CM | POA: Diagnosis not present

## 2022-04-08 NOTE — Progress Notes (Signed)
Location:  Occupational psychologist of Service:  SNF (31) Provider:   Cindi Carbon, [place] [phone]   [name], MD  Patient Care Team: [name], MD as PCP - General (Internal Medicine) Larey Dresser, MD as PCP - Cardiology (Cardiology) [name], MD as Consulting Physician (Ophthalmology) [name], MD (Internal Medicine)  Extended Emergency Contact Information Primary Emergency Contact: [name] Address: [address]          [place], [place] [address] [place] Phone: [phone] Mobile Phone: [phone] Relation: Spouse Secondary Emergency Contact: [name], [place] [address] [name] of [place] Phone: [phone] Relation: Son  Code Status:  DNR Goals of care: Advanced Directive information    03/10/2022    3:45 PM  Advanced Directives  Does Patient Have a Medical Advance Directive? Yes  Type of Advance Directive Out of facility DNR (pink MOST or yellow form);Healthcare Power of Attorney  Does patient want to make changes to medical advance directive? No - Patient declined  Copy of [place] in Chart? Yes - validated most recent copy scanned in chart (See row information)     Chief Complaint  Patient presents with   Acute Visit    Question about PSA and prior auth    HPI:  Pt is a 82 y.o. male seen today for an acute visit for question about PSA and prior auth  Pt has a hx of bph with urinary retention. No current symptoms. Takes Methenamine for UTI prevention. Has not had one in a very long time. Denies any feelings of fullness, difficulty urination. Wet briefs documented in chart.  No fever or dysuria   Methenamine may not be covered by insurance, needs prior auth.  Has seen urology in the past. No notes for review at this time.     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    Other and unspecified hyperlipidemia    Pre-diabetes    Previous back surgery 1978, jan 2007   Psychosexual dysfunction with inhibited sexual excitement    SOB (shortness of breath)    "with heavy exercise"   Stroke ([place]) 08/2013   "I WAS IN AFIB AND THREW A CLOT, THE EFFECTS WERE TRANSITORY AND DIDNT LAST BUT FOR 30 MINUTES"    Thoracic aortic aneurysm [facility])    Past Surgical History:  Procedure Laterality Date   AORTIC AND MITRAL VALVE REPLACEMENT     09/2004   CARDIOVERSION     3 times from 2004-2006   CARDIOVERSION N/A 09/26/2013   Procedure: CARDIOVERSION;  Surgeon: Larey Dresser, MD;  Location: [place];  Service: Cardiovascular;  Laterality: N/A;   CARDIOVERSION N/A 06/19/2014   Procedure: CARDIOVERSION;  Surgeon: Jerline Pain, MD;  Location: [facility] ENDOSCOPY;  Service: Cardiovascular;  Laterality: N/A;   CARDIOVERSION N/A 04/24/2017   Procedure: CARDIOVERSION;  Surgeon: Larey Dresser, MD;  Location: [facility] ENDOSCOPY;  Service: Cardiovascular;  Laterality: N/A;   CARDIOVERSION N/A 06/08/2017   Procedure: CARDIOVERSION;  Surgeon: Pixie Casino, MD;  Location: [facility] ENDOSCOPY;  Service: Cardiovascular;  Laterality: N/A;   CARDIOVERSION N/A 08/24/2017   Procedure: CARDIOVERSION;  Surgeon: Skeet Latch, MD;  Location: [facility] ENDOSCOPY;  Service: Cardiovascular;  Laterality: N/A;   CARDIOVERSION N/A 02/14/2018   Procedure: CARDIOVERSION;  Surgeon: Larey Dresser, MD;  Location: [facility] ENDOSCOPY;  Service: Cardiovascular;  Laterality:  N/A;   COLONOSCOPY     CRANIOTOMY N/A 07/28/2020   Procedure: CRANIOTOMY FOR EVACUATION OF SUBDURAL HEMATOMA;  Surgeon: Eustace Saldierna, MD;  Location: [place];  Service: Neurosurgery;  Laterality: N/A;   IR ANGIO VERTEBRAL SEL SUBCLAVIAN INNOMINATE UNI R MOD SED  08/07/2020   IR CT HEAD LTD  08/07/2020   IR PERCUTANEOUS ART THROMBECTOMY/INFUSION INTRACRANIAL INC DIAG ANGIO  08/07/2020   laminectomies     10/1975 and in 03/2005   RADIOLOGY WITH ANESTHESIA N/A 08/07/2020   Procedure: IR WITH ANESTHESIA;  Surgeon: Luanne Bras, MD;  Location: [place];  Service: Radiology;  Laterality: N/A;   TEE WITHOUT CARDIOVERSION N/A 12/18/2020   Procedure: TRANSESOPHAGEAL ECHOCARDIOGRAM (TEE);  Surgeon: Larey Dresser, MD;  Location: [facility] ENDOSCOPY;  Service: Cardiovascular;  Laterality: N/A;   [place] Right 04/03/2018   Procedure: TOTAL HIP ARTHROPLASTY ANTERIOR APPROACH;  Surgeon: Paralee Cancel, MD;  Location: [facility] ORS;  Service: Orthopedics;  Laterality: Right;  70 mins    Allergies  Allergen Reactions   Naproxen Hives   No [facility] Food Allergies Other (See Comments)    Scallops - distress, nausea and vomitting    Outpatient Encounter Medications as of 04/08/2022  Medication Sig   acetaminophen (TYLENOL) 325 MG tablet Take 650 mg by mouth as needed for mild pain.   Amantadine HCl 100 MG tablet Take 100 mg by mouth 2 (two) times daily.   amoxicillin (AMOXIL) 500 MG capsule Take 500 mg by mouth 2 (two) times daily.   apixaban (ELIQUIS) 5 MG TABS tablet Take 1 tablet (5 mg total) by mouth 2 (two) times daily.   Artificial Saliva (BIOTENE MOISTURIZING MOUTH MT) Use as directed 2 sprays in the mouth or throat in the morning, at noon, and at bedtime.   atorvastatin (LIPITOR) 80 MG tablet Take 1 tablet (80 mg total) by mouth daily.   bisacodyl (DULCOLAX) 10 MG suppository Place 1 suppository (10 mg total) rectally daily as needed for moderate constipation or mild  constipation.   clobetasol (TEMOVATE) 0.05 % external solution Apply 1 application  topically as needed (itching).   diazePAM, 15 MG Dose, (VALTOCO 15 MG DOSE) 2 x 7.5 MG/0.1ML LQPK Place 7.5 mg into each nostril (total of 15 mg) for grand mal seizure lasting over 5 minutes. Can repeat once in 4 hours. Do Not use more than twice in 24 hours   dofetilide (TIKOSYN) 125 MCG capsule Take 3 capsules (375 mcg total) by mouth 2 (two) times daily.   famotidine (PEPCID) 20 MG tablet Take 20 mg by mouth at bedtime as needed for indigestion.   finasteride (PROSCAR) 5 MG tablet Take 1 tablet (5 mg total) by mouth daily.   Glucerna (GLUCERNA) LIQD Take 237 mLs by mouth daily.   hydrocortisone 2.5 % cream Apply 1 application. topically daily as needed (  itching).   lacosamide (VIMPAT) 10 MG/ML oral solution Take 5 mLs (50 mg total) by mouth in the morning.   lacosamide (VIMPAT) 10 MG/ML oral solution Take 7.5 mLs (75 mg total) by mouth at bedtime.   levalbuterol (XOPENEX) 0.63 MG/3ML nebulizer solution Take 0.63 mg by nebulization every 6 (six) hours as needed for wheezing or shortness of breath.   magnesium gluconate (MAGONATE) 500 MG tablet Take 1 tablet (500 mg total) by mouth daily.   melatonin 5 MG TABS Take 5 mg by mouth at bedtime.   methenamine (MANDELAMINE) 1 g tablet Take 1,000 mg by mouth 2 (two) times daily.   methylphenidate (RITALIN) 5 MG tablet Take 1 tablet (5 mg total) by mouth 2 (two) times daily. Take at 7 am and 12 pm   Multiple Vitamin (MULTIVITAMIN WITH MINERALS) TABS tablet Take 1 tablet by mouth daily.   omeprazole (PRILOSEC) 20 MG capsule Take 20 mg by mouth daily.   Polyethyl Glycol-Propyl Glycol (SYSTANE) 0.4-0.3 % SOLN Place 2 drops into both eyes daily.   polyethylene glycol (MIRALAX / GLYCOLAX) 17 g packet Take 17 g by mouth every other day.   potassium chloride (KLOR-CON) 10 MEQ tablet Take 2 tablets (20 mEq total) by mouth daily.   senna-docusate (SENOKOT-S) 8.6-50 MG tablet Take  1 tablet by mouth at bedtime as needed.   Torsemide 40 MG TABS Take 40 mg by mouth daily.   No facility-administered encounter medications on file as of 04/08/2022.    Review of Systems  Constitutional:  Negative for activity change, appetite change, chills, diaphoresis, fatigue, fever and unexpected weight change.  Respiratory:  Negative for cough, shortness of breath, wheezing and stridor.   Cardiovascular:  Negative for chest pain, palpitations and leg swelling.  Gastrointestinal:  Negative for abdominal distention, abdominal pain, constipation and diarrhea.  Genitourinary:  Negative for decreased urine volume, difficulty urinating, dysuria, flank pain, frequency, hematuria and urgency.  Musculoskeletal:  Positive for gait problem. Negative for arthralgias, back pain, joint swelling and myalgias.  Neurological:  Positive for speech difficulty and weakness. Negative for dizziness, seizures, syncope, facial asymmetry and headaches.  Hematological:  Negative for adenopathy. Does not bruise/bleed easily.  Psychiatric/Behavioral:  Positive for confusion. Negative for agitation and behavioral problems.     Immunization History  Administered Date(s) Administered   Fluad Quad(high Dose 65+) 12/06/2018, 12/09/2020, 12/10/2021   Influenza Whole 12/25/2008, 01/05/2010, 12/30/2011   Influenza, High Dose Seasonal PF 12/14/2016   Influenza,inj,Quad PF,6+ Mos 12/19/2013   Influenza,inj,quad, With Preservative 11/22/2017   Influenza-Unspecified 12/14/2015, 12/24/2019, 12/10/2021   Moderna Covid-19 Vaccine Bivalent Booster 4yr & up 01/12/2021   Moderna SARS-COV2 Booster Vaccination 01/02/2020, 06/26/2020   Moderna Sars-Covid-2 Vaccination 04/08/2019, 05/05/2019   Pneumococcal Conjugate-13 12/19/2013   Pneumococcal Polysaccharide-23 09/24/2007, 05/22/2017   Td 12/25/2008   Td (Adult), 2 Lf Tetanus Toxid, Preservative Free 12/25/2008   Tdap 03/22/2018   Zoster Recombinat (Shingrix) 07/16/2019,  10/28/2019   Zoster, Live 09/24/2007   Pertinent  Health Maintenance Due  Topic Date Due   HEMOGLOBIN A1C  01/05/2022   OPHTHALMOLOGY EXAM  10/06/2022 (Originally 10/04/1950)   FOOT EXAM  10/23/2022   INFLUENZA VACCINE  Completed   COLONOSCOPY (Pts 45-45yrInsurance coverage will need to be confirmed)  Discontinued      08/11/2021    8:00 AM 08/11/2021    7:30 PM 08/12/2021    8:00 AM 10/13/2021    2:32 PM 02/02/2022   11:24 AM  Fall Risk  Falls in the past  year?    0 0  Was there an injury with Fall?     0  Fall Risk Category Calculator     0  Fall Risk Category (Retired)     Low  (RETIRED) Patient Fall Risk Level High fall risk High fall risk High fall risk  High fall risk  Patient at Risk for Falls Due to     Impaired mobility  Fall risk Follow up     Falls evaluation completed   Functional Status Survey:    Vitals:   04/08/22 1150  BP: (!) 146/77  Pulse: 72  Resp: (!) 34  Temp: (!) 97 F (36.1 C)  SpO2: 100%  Weight: 140 lb (63.5 kg)   Body mass index is 21.93 kg/m. Physical Exam Vitals and nursing note reviewed.  Constitutional:      General: He is not in acute distress.    Appearance: He is not diaphoretic.  HENT:     Head: Normocephalic and atraumatic.  Neck:     Thyroid: No thyromegaly.     Vascular: No JVD.     Trachea: No tracheal deviation.  Cardiovascular:     Rate and Rhythm: Normal rate and regular rhythm.     Heart sounds: No murmur heard. Pulmonary:     Effort: Pulmonary effort is normal. No respiratory distress.     Breath sounds: No wheezing.     Comments: Decreased bases Abdominal:     General: Bowel sounds are normal. There is distension (mild).     Palpations: Abdomen is soft.     Tenderness: There is no abdominal tenderness. There is no right CVA tenderness or left CVA tenderness.  Musculoskeletal:     Right lower leg: No edema.     Left lower leg: No edema.  Lymphadenopathy:     Cervical: No cervical adenopathy.  Skin:    General:  Skin is warm and dry.  Neurological:     Mental Status: He is alert.     Comments: Left sided weakness and neglect  Psychiatric:        Mood and Affect: Mood normal.     Labs reviewed: Recent Labs    08/11/21 0408 08/12/21 0107 08/16/21 0000 12/13/21 1157 12/14/21 0000 01/03/22 1322 01/10/22 0000 01/20/22 0000 02/02/22 1202 03/07/22 0000  NA 140 142   < >  --    < > 144   < > 143 145 149*  K 3.6 3.9   < >  --    < > 3.7   < > 3.5 3.6 3.8  CL 109 113*   < >  --    < > 108   < > 104 107 109*  CO2 24 23   < >  --    < > 25   < > 27* 28 28*  GLUCOSE 120* 143*  --   --   --  120*  --   --  126*  --   BUN 17 17   < >  --    < > 27*   < > 28* 32* 34*  CREATININE 0.81 0.83   < >  --    < > 1.18   < > 0.9 1.14 1.1  CALCIUM 8.6* 8.7*   < >  --    < > 9.1   < > 9.7 9.2 9.3  MG 1.9 2.0  --  2.4  --   --   --   --   --   --    < > =  values in this interval not displayed.   Recent Labs    08/11/21 0408 09/07/21 0000 09/08/21 0000 01/03/22 1322 02/02/22 1202  AST '27 24 24 25 22  '$ ALT '23 18 18 20 23  '$ ALKPHOS 80 105 105 100  --   BILITOT 0.8  --   --  1.1 0.9  PROT 5.8*  --   --  7.4 7.1  ALBUMIN 3.1* 3.5 3.5 3.6  --    Recent Labs    08/10/21 0937 08/10/21 0957 08/11/21 0408 08/12/21 0107 09/07/21 0000 12/06/21 0000 01/20/22 0000 02/02/22 1202  WBC 16.1*  --  9.1 10.2   < > 8.1 10.2 9.4  NEUTROABS 14.3*  --   --  7.8*  --   --   --  6,110  HGB 10.0*   < > 9.4* 9.4*   < > 9.2* 9.5* 9.0*  HCT 31.6*   < > 28.7* 29.8*   < > 28* 30* 28.3*  MCV 96.3  --  92.0 94.3  --   --   --  86.0  PLT 238  --  218 227   < > 338 253 261   < > = values in this interval not displayed.   Lab Results  Component Value Date   TSH 1.773 01/24/2021   Lab Results  Component Value Date   HGBA1C 5.7 07/05/2021   Lab Results  Component Value Date   CHOL 110 07/05/2021   HDL 43 07/05/2021   LDLCALC 57 07/05/2021   TRIG 51 07/05/2021   CHOLHDL 2.8 08/08/2020    Significant Diagnostic  Results in last 30 days:  No results found.  Assessment/Plan 1. Benign prostatic hyperplasia without lower urinary tract symptoms No current symptoms Wife requesting PSA  Discussed that due to his current condition this would not likely be beneficial. He is having labs drawn and she would like it done.  Seen urology prn   2. 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  Discussed that he is also on amoxicillin  Could trial off Wife would like to continue Gave prior auth info to [facility] DON to review    Family/ staff Communication: Herbert Pun  Labs/tests ordered:  PSA next draw

## 2022-04-11 DIAGNOSIS — S065X3S Traumatic subdural hemorrhage with loss of consciousness of 1 hour to 5 hours 59 minutes, sequela: Secondary | ICD-10-CM | POA: Diagnosis not present

## 2022-04-11 DIAGNOSIS — R41841 Cognitive communication deficit: Secondary | ICD-10-CM | POA: Diagnosis not present

## 2022-04-11 DIAGNOSIS — R1312 Dysphagia, oropharyngeal phase: Secondary | ICD-10-CM | POA: Diagnosis not present

## 2022-04-11 DIAGNOSIS — I69354 Hemiplegia and hemiparesis following cerebral infarction affecting left non-dominant side: Secondary | ICD-10-CM | POA: Diagnosis not present

## 2022-04-11 DIAGNOSIS — G4089 Other seizures: Secondary | ICD-10-CM | POA: Diagnosis not present

## 2022-04-11 DIAGNOSIS — I5022 Chronic systolic (congestive) heart failure: Secondary | ICD-10-CM | POA: Diagnosis not present

## 2022-04-11 DIAGNOSIS — N401 Enlarged prostate with lower urinary tract symptoms: Secondary | ICD-10-CM | POA: Diagnosis not present

## 2022-04-12 DIAGNOSIS — I69354 Hemiplegia and hemiparesis following cerebral infarction affecting left non-dominant side: Secondary | ICD-10-CM | POA: Diagnosis not present

## 2022-04-12 DIAGNOSIS — I5022 Chronic systolic (congestive) heart failure: Secondary | ICD-10-CM | POA: Diagnosis not present

## 2022-04-12 DIAGNOSIS — G4089 Other seizures: Secondary | ICD-10-CM | POA: Diagnosis not present

## 2022-04-12 DIAGNOSIS — R41841 Cognitive communication deficit: Secondary | ICD-10-CM | POA: Diagnosis not present

## 2022-04-12 DIAGNOSIS — S065X3S Traumatic subdural hemorrhage with loss of consciousness of 1 hour to 5 hours 59 minutes, sequela: Secondary | ICD-10-CM | POA: Diagnosis not present

## 2022-04-12 DIAGNOSIS — R1312 Dysphagia, oropharyngeal phase: Secondary | ICD-10-CM | POA: Diagnosis not present

## 2022-04-13 DIAGNOSIS — R1312 Dysphagia, oropharyngeal phase: Secondary | ICD-10-CM | POA: Diagnosis not present

## 2022-04-13 DIAGNOSIS — R41841 Cognitive communication deficit: Secondary | ICD-10-CM | POA: Diagnosis not present

## 2022-04-13 DIAGNOSIS — S065X3S Traumatic subdural hemorrhage with loss of consciousness of 1 hour to 5 hours 59 minutes, sequela: Secondary | ICD-10-CM | POA: Diagnosis not present

## 2022-04-13 DIAGNOSIS — I69354 Hemiplegia and hemiparesis following cerebral infarction affecting left non-dominant side: Secondary | ICD-10-CM | POA: Diagnosis not present

## 2022-04-13 DIAGNOSIS — G4089 Other seizures: Secondary | ICD-10-CM | POA: Diagnosis not present

## 2022-04-13 DIAGNOSIS — I5022 Chronic systolic (congestive) heart failure: Secondary | ICD-10-CM | POA: Diagnosis not present

## 2022-04-14 DIAGNOSIS — R1312 Dysphagia, oropharyngeal phase: Secondary | ICD-10-CM | POA: Diagnosis not present

## 2022-04-14 DIAGNOSIS — I5022 Chronic systolic (congestive) heart failure: Secondary | ICD-10-CM | POA: Diagnosis not present

## 2022-04-14 DIAGNOSIS — G4089 Other seizures: Secondary | ICD-10-CM | POA: Diagnosis not present

## 2022-04-14 DIAGNOSIS — S065X3S Traumatic subdural hemorrhage with loss of consciousness of 1 hour to 5 hours 59 minutes, sequela: Secondary | ICD-10-CM | POA: Diagnosis not present

## 2022-04-14 DIAGNOSIS — I69354 Hemiplegia and hemiparesis following cerebral infarction affecting left non-dominant side: Secondary | ICD-10-CM | POA: Diagnosis not present

## 2022-04-14 DIAGNOSIS — R41841 Cognitive communication deficit: Secondary | ICD-10-CM | POA: Diagnosis not present

## 2022-04-15 DIAGNOSIS — G4089 Other seizures: Secondary | ICD-10-CM | POA: Diagnosis not present

## 2022-04-15 DIAGNOSIS — R41841 Cognitive communication deficit: Secondary | ICD-10-CM | POA: Diagnosis not present

## 2022-04-15 DIAGNOSIS — R1312 Dysphagia, oropharyngeal phase: Secondary | ICD-10-CM | POA: Diagnosis not present

## 2022-04-15 DIAGNOSIS — I69354 Hemiplegia and hemiparesis following cerebral infarction affecting left non-dominant side: Secondary | ICD-10-CM | POA: Diagnosis not present

## 2022-04-15 DIAGNOSIS — S065X3S Traumatic subdural hemorrhage with loss of consciousness of 1 hour to 5 hours 59 minutes, sequela: Secondary | ICD-10-CM | POA: Diagnosis not present

## 2022-04-15 DIAGNOSIS — I5022 Chronic systolic (congestive) heart failure: Secondary | ICD-10-CM | POA: Diagnosis not present

## 2022-04-18 DIAGNOSIS — I5033 Acute on chronic diastolic (congestive) heart failure: Secondary | ICD-10-CM | POA: Diagnosis not present

## 2022-04-18 DIAGNOSIS — R1312 Dysphagia, oropharyngeal phase: Secondary | ICD-10-CM | POA: Diagnosis not present

## 2022-04-18 DIAGNOSIS — I69354 Hemiplegia and hemiparesis following cerebral infarction affecting left non-dominant side: Secondary | ICD-10-CM | POA: Diagnosis not present

## 2022-04-18 DIAGNOSIS — G4089 Other seizures: Secondary | ICD-10-CM | POA: Diagnosis not present

## 2022-04-18 DIAGNOSIS — R41841 Cognitive communication deficit: Secondary | ICD-10-CM | POA: Diagnosis not present

## 2022-04-18 DIAGNOSIS — S065X3S Traumatic subdural hemorrhage with loss of consciousness of 1 hour to 5 hours 59 minutes, sequela: Secondary | ICD-10-CM | POA: Diagnosis not present

## 2022-04-18 DIAGNOSIS — I5022 Chronic systolic (congestive) heart failure: Secondary | ICD-10-CM | POA: Diagnosis not present

## 2022-04-19 DIAGNOSIS — R41841 Cognitive communication deficit: Secondary | ICD-10-CM | POA: Diagnosis not present

## 2022-04-19 DIAGNOSIS — G4089 Other seizures: Secondary | ICD-10-CM | POA: Diagnosis not present

## 2022-04-19 DIAGNOSIS — I69354 Hemiplegia and hemiparesis following cerebral infarction affecting left non-dominant side: Secondary | ICD-10-CM | POA: Diagnosis not present

## 2022-04-19 DIAGNOSIS — I5022 Chronic systolic (congestive) heart failure: Secondary | ICD-10-CM | POA: Diagnosis not present

## 2022-04-19 DIAGNOSIS — S065X3S Traumatic subdural hemorrhage with loss of consciousness of 1 hour to 5 hours 59 minutes, sequela: Secondary | ICD-10-CM | POA: Diagnosis not present

## 2022-04-19 DIAGNOSIS — R1312 Dysphagia, oropharyngeal phase: Secondary | ICD-10-CM | POA: Diagnosis not present

## 2022-04-20 ENCOUNTER — Encounter: Payer: Medicare Other | Attending: Physical Medicine & Rehabilitation | Admitting: Physical Medicine & Rehabilitation

## 2022-04-20 ENCOUNTER — Encounter: Payer: Self-pay | Admitting: Physical Medicine & Rehabilitation

## 2022-04-20 VITALS — BP 136/82 | HR 82 | Temp 98.1°F | Ht 67.0 in

## 2022-04-20 DIAGNOSIS — S065X3S Traumatic subdural hemorrhage with loss of consciousness of 1 hour to 5 hours 59 minutes, sequela: Secondary | ICD-10-CM | POA: Diagnosis not present

## 2022-04-20 DIAGNOSIS — R1312 Dysphagia, oropharyngeal phase: Secondary | ICD-10-CM | POA: Diagnosis not present

## 2022-04-20 DIAGNOSIS — I5022 Chronic systolic (congestive) heart failure: Secondary | ICD-10-CM | POA: Diagnosis not present

## 2022-04-20 DIAGNOSIS — R41841 Cognitive communication deficit: Secondary | ICD-10-CM | POA: Diagnosis not present

## 2022-04-20 DIAGNOSIS — G4089 Other seizures: Secondary | ICD-10-CM | POA: Diagnosis not present

## 2022-04-20 DIAGNOSIS — G8114 Spastic hemiplegia affecting left nondominant side: Secondary | ICD-10-CM

## 2022-04-20 DIAGNOSIS — I69354 Hemiplegia and hemiparesis following cerebral infarction affecting left non-dominant side: Secondary | ICD-10-CM | POA: Diagnosis not present

## 2022-04-20 MED ORDER — ONABOTULINUMTOXINA 100 UNITS IJ SOLR
400.0000 [IU] | Freq: Once | INTRAMUSCULAR | Status: AC
  Administered 2022-04-20: 400 [IU] via INTRAMUSCULAR

## 2022-04-20 NOTE — Progress Notes (Signed)
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 This includes bleeding, bruising, infection, excessive weakness, or medication side effects. A REMS form is on file and signed.  Needle: 66m injectable monopolar needle electrode    Number of units per muscle Pectoralis Major 0 units Pectoralis Minor 0 units Biceps 100 units Brachioradialis 50 units FCR 25 units FCU 25 units FDS 100 units FDP 100 units FPL 0 units Palmaris Longus 0 units Pronator Teres 0 units Pronator Quadratus 0 units Lumbricals 0 units All injections were done after obtaining appropriate EMG activity and after negative drawback for blood. The patient tolerated the procedure well. Post procedure instructions were given. Return in about 3 months (around 07/19/2022) for botox 400 u LUE.

## 2022-04-20 NOTE — Patient Instructions (Signed)
ALWAYS FEEL FREE TO CALL OUR OFFICE WITH ANY PROBLEMS OR QUESTIONS ([phone])  **PLEASE NOTE** ALL MEDICATION REFILL REQUESTS (INCLUDING CONTROLLED SUBSTANCES) NEED TO BE MADE AT LEAST 7 DAYS PRIOR TO REFILL BEING DUE. ANY REFILL REQUESTS INSIDE THAT TIME FRAME MAY RESULT IN DELAYS IN RECEIVING YOUR PRESCRIPTION.                    

## 2022-04-21 DIAGNOSIS — S065X3S Traumatic subdural hemorrhage with loss of consciousness of 1 hour to 5 hours 59 minutes, sequela: Secondary | ICD-10-CM | POA: Diagnosis not present

## 2022-04-21 DIAGNOSIS — I5022 Chronic systolic (congestive) heart failure: Secondary | ICD-10-CM | POA: Diagnosis not present

## 2022-04-21 DIAGNOSIS — I69354 Hemiplegia and hemiparesis following cerebral infarction affecting left non-dominant side: Secondary | ICD-10-CM | POA: Diagnosis not present

## 2022-04-21 DIAGNOSIS — R41841 Cognitive communication deficit: Secondary | ICD-10-CM | POA: Diagnosis not present

## 2022-04-21 DIAGNOSIS — G4089 Other seizures: Secondary | ICD-10-CM | POA: Diagnosis not present

## 2022-04-21 DIAGNOSIS — R1312 Dysphagia, oropharyngeal phase: Secondary | ICD-10-CM | POA: Diagnosis not present

## 2022-04-22 ENCOUNTER — Other Ambulatory Visit: Payer: Self-pay | Admitting: Adult Health

## 2022-04-22 DIAGNOSIS — R1312 Dysphagia, oropharyngeal phase: Secondary | ICD-10-CM | POA: Diagnosis not present

## 2022-04-22 DIAGNOSIS — G4089 Other seizures: Secondary | ICD-10-CM | POA: Diagnosis not present

## 2022-04-22 DIAGNOSIS — I5022 Chronic systolic (congestive) heart failure: Secondary | ICD-10-CM | POA: Diagnosis not present

## 2022-04-22 DIAGNOSIS — R41841 Cognitive communication deficit: Secondary | ICD-10-CM | POA: Diagnosis not present

## 2022-04-22 DIAGNOSIS — I69354 Hemiplegia and hemiparesis following cerebral infarction affecting left non-dominant side: Secondary | ICD-10-CM | POA: Diagnosis not present

## 2022-04-22 DIAGNOSIS — S065X3S Traumatic subdural hemorrhage with loss of consciousness of 1 hour to 5 hours 59 minutes, sequela: Secondary | ICD-10-CM | POA: Diagnosis not present

## 2022-04-22 MED ORDER — METHYLPHENIDATE HCL 5 MG PO TABS: 5.0000 mg | ORAL_TABLET | Freq: Two times a day (BID) | ORAL | 0 refills | Status: DC

## 2022-04-25 DIAGNOSIS — G4089 Other seizures: Secondary | ICD-10-CM | POA: Diagnosis not present

## 2022-04-25 DIAGNOSIS — R1312 Dysphagia, oropharyngeal phase: Secondary | ICD-10-CM | POA: Diagnosis not present

## 2022-04-25 DIAGNOSIS — I5022 Chronic systolic (congestive) heart failure: Secondary | ICD-10-CM | POA: Diagnosis not present

## 2022-04-25 DIAGNOSIS — I5033 Acute on chronic diastolic (congestive) heart failure: Secondary | ICD-10-CM | POA: Diagnosis not present

## 2022-04-25 DIAGNOSIS — S065X3S Traumatic subdural hemorrhage with loss of consciousness of 1 hour to 5 hours 59 minutes, sequela: Secondary | ICD-10-CM | POA: Diagnosis not present

## 2022-04-25 DIAGNOSIS — I69354 Hemiplegia and hemiparesis following cerebral infarction affecting left non-dominant side: Secondary | ICD-10-CM | POA: Diagnosis not present

## 2022-04-25 DIAGNOSIS — R41841 Cognitive communication deficit: Secondary | ICD-10-CM | POA: Diagnosis not present

## 2022-04-26 ENCOUNTER — Encounter: Payer: Self-pay | Admitting: Neurology

## 2022-04-26 ENCOUNTER — Ambulatory Visit (INDEPENDENT_AMBULATORY_CARE_PROVIDER_SITE_OTHER): Payer: Medicare Other | Admitting: Neurology

## 2022-04-26 VITALS — BP 112/70 | HR 64 | Ht 67.0 in | Wt 145.0 lb

## 2022-04-26 DIAGNOSIS — I63412 Cerebral infarction due to embolism of left middle cerebral artery: Secondary | ICD-10-CM

## 2022-04-26 DIAGNOSIS — G4089 Other seizures: Secondary | ICD-10-CM | POA: Diagnosis not present

## 2022-04-26 DIAGNOSIS — G4737 Central sleep apnea in conditions classified elsewhere: Secondary | ICD-10-CM | POA: Diagnosis not present

## 2022-04-26 DIAGNOSIS — I69354 Hemiplegia and hemiparesis following cerebral infarction affecting left non-dominant side: Secondary | ICD-10-CM | POA: Diagnosis not present

## 2022-04-26 DIAGNOSIS — I4819 Other persistent atrial fibrillation: Secondary | ICD-10-CM | POA: Diagnosis not present

## 2022-04-26 DIAGNOSIS — G4730 Sleep apnea, unspecified: Secondary | ICD-10-CM | POA: Diagnosis not present

## 2022-04-26 DIAGNOSIS — R569 Unspecified convulsions: Secondary | ICD-10-CM

## 2022-04-26 DIAGNOSIS — I484 Atypical atrial flutter: Secondary | ICD-10-CM | POA: Diagnosis not present

## 2022-04-26 DIAGNOSIS — R41841 Cognitive communication deficit: Secondary | ICD-10-CM | POA: Diagnosis not present

## 2022-04-26 DIAGNOSIS — R0681 Apnea, not elsewhere classified: Secondary | ICD-10-CM

## 2022-04-26 DIAGNOSIS — I5022 Chronic systolic (congestive) heart failure: Secondary | ICD-10-CM | POA: Diagnosis not present

## 2022-04-26 DIAGNOSIS — R1312 Dysphagia, oropharyngeal phase: Secondary | ICD-10-CM | POA: Diagnosis not present

## 2022-04-26 DIAGNOSIS — S065X3S Traumatic subdural hemorrhage with loss of consciousness of 1 hour to 5 hours 59 minutes, sequela: Secondary | ICD-10-CM | POA: Diagnosis not present

## 2022-04-26 DIAGNOSIS — I69398 Other sequelae of cerebral infarction: Secondary | ICD-10-CM | POA: Diagnosis not present

## 2022-04-26 MED ORDER — VALTOCO 15 MG DOSE 7.5 MG/0.1ML NA LQPK: NASAL | 1 refills | Status: DC

## 2022-04-26 MED ORDER — LACOSAMIDE 10 MG/ML PO SOLN: 50.0000 mg | Freq: Every morning | ORAL | 1 refills | Status: DC

## 2022-04-26 MED ORDER — LACOSAMIDE 10 MG/ML PO SOLN: 100.0000 mg | Freq: Every day | ORAL | 3 refills | Status: DC

## 2022-04-26 MED ORDER — METHYLPHENIDATE HCL 5 MG PO TABS: 5.0000 mg | ORAL_TABLET | Freq: Two times a day (BID) | ORAL | 0 refills | Status: DC

## 2022-04-26 NOTE — Patient Instructions (Addendum)
I today will change the fullface mask to a ResMed F10 that sits under the nose and hopefully gets a little bit better seal we still have the option of adding a chinstrap.  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  Interestingly the seizure frequency has not been reduced he has 1 or 2 a month and his very first seizure was in June of last year I believe.  So in order to try to control the seizures I will order Vimpat at 50 mg in the morning and 75 mg at night will be increased now to 100 mg at night. His wife is an excellent record Armed forces training and education officer of his seizures.  There was a 3 months period  between June and September were no seizure was witnessed - but then there were 3 in October 3 in November 1 in December and 1 in January.  He has more difficulties with swallowing which could be a side effect of higher sedation.  His left soft palate is lower than his right which fits the stroke residual findings.  He is overall less alert and less vocal.  Ritalin however seems to have helped this and on a day when he could not get his Ritalin dose he was significantly sleepy as noticed by his wife.  So this will be refilled.   Rv in 4 months with me.

## 2022-04-26 NOTE — Progress Notes (Signed)
SLEEP MEDICINE CLINIC    Provider:  Larey Seat, MD  Primary Care Physician:  [name], MD [address] [place] [address]     Referring Provider: Dr [name]         Chief Complaint according to patient   Patient presents with:     Patient (Initial Visit)     #10 with wife and son.             Pt wife states he only sleep a few hours. Pt wife states she believe that the CPAP has been helping. 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  Pt with wife and son, rm 2. Here for follow up and taking over as primary neurologist post his stroke and seizures. Family states that Dr Brett Fairy had agreed to take on but was after her return. Pt is set up with [facility] on auto bipap and he sometimes takes it off during the night. Still resides in [facility]      Other Wife has brought in a timeline of seizure events that [name] [name] has had.         HISTORY OF PRESENT ILLNESS:    04-26-2022:  [name] [name] is a 82 y.o. year old White or Caucasian male patient seen in RV on 04/26/2022 . His wife an son provide information, the patient is  unable to contribute much. He is wheelchair bound.  [name] BiPAP out the download shows excellent compliance 88 out of 90 days and 91% of those days with over 4 hours of consecutive use, averaging 7 hours 50 minutes.  The residual AHI remains very high 27.5 events per hour 8.8 of these unknown and related likely to the high air leak.  Air leak at the 95th percentile was 964 L a minute.  Central apneas amounted to 15.2 and obstructive to only 3 apneas per hour among those residuals.  I today will change the fullface mask to a ResMed F10 that sits under the nose and hopefully gets a little bit better seal we still have the option of adding a chinstrap.  I am concerned that he has gotten more sleepy and his Epworth Sleepiness Scale reflects this at 19 out of 24 points however this is also followed an increase in Vimpat  since he developed seizures last summer.  Interestingly the seizure frequency has not been reduced he has 1 or 2 a month and his very first seizure was in June of last year I believe.  So in order to try to control the seizures I will order Vimpat at 50 mg in the morning and 75 mg at night will be increased now to 100 mg at night. His wife is an excellent record Armed forces training and education officer of his seizures.  There was a 3 months period  between June and September were no seizure was witnessed - but then there were 3 in October 3 in November 1 in December and 1 in January.  He has more difficulties with swallowing which could be a side effect of higher sedation.  His left soft palate is lower than his right which fits the stroke residual findings.  He is overall less alert and less vocal.  Ritalin however seems to have helped this and on a day when he could not get his Ritalin dose he was significantly sleepy as noticed by his wife.  So this will be refilled.     I have the pleasure of  meeting [name] [name] today in the presence of his wife and son, he had undergone his sleep study that showed a very complex form of apnea and finally was placed on and so called out to BiPAP the maximum inspiratory pressure was 20 cmH2O the minimum expiratory pressure is set at 5 and the pressure support is 5 cm.  This is a machine that can find its pressure each night and for this reason was chosen as the patient could not attend an in lab sleep study.  His BiPAP therapy shows a high number of central apneas to be still present 11.4/h there are very few obstructive apneas remain 2.4/h he does have a very high air leakage the 95th percentile air leak is 70 L a minute.  The total AHI was 24/h but 9.7 of these were called unknown apneas and I usually related to high air leakage.  The compliance of use is excellent the patient uses the machine every day and at least over 90% for 4 hours or more at night the average user time is 7 hours and 45  minutes.  My goal today is to reduce the air leakage by adding a chinstrap I will put a DME order for this in I also wanted to update his current medical history the patient is followed by Dr. Eda Keys as physical rehab specialist and he received Botox injections for the left upper extremity this has helped him to relax his finger flexion and he has actually shown some left arm movements.  His degree of sleepiness is still severe but he was placed on Ritalin and the family feels that this has allowed him to concentrate longer being cognitively more focused and involved even in conversation now.  The decision to add Ritalin was blessed by Dr. [name] his congestive heart failure specialist.  He had a hospital stay in June from June 5 for a seizure and another seizure occurred at home on the ninth 8 September this was milder he did not need to be hospitalized the repetitive involuntary movements were controlled after he was put back on oxygen.  During the summer he was 2.4 weeks 2.5 weeks on oxygen supplementation now weaned off the oxygen was given into the BiPAP.  He enjoys the attention of a very good physical therapist he has occupational therapy and he also has still speech therapy.  [name] [name] has asked me if we can further adjust his current apnea treatments and I think I would like to go first with a chinstrap and if this is not possible I would consider in a as the titration in the sleep lab but I would need a nurse to be with him here at night.[facility] offers a private duty nurse for such occassions.       [name] [name] has a history spanning from [place] 2022 to now raised to very impairing strokes.  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  The progress seem to have been limited somewhat by bouts of lethargy, there are cognitive deficits and especially communication deficits.  He is now in a nursing facility for progressive therapy.   He resides at [facility]  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   He had fallen at church backwards striking the occiput on Jul 26, 2020 and in the emergency room there was initially no acute finding noted the day after he returned with difficulty speaking and now with right-sided weakness.  Dr. Ronnald Ramp evacuated the left-sided traumatic subdural hemorrhage that had caused mass effect and subfalcine herniation.       Chief concern according to patient :  Lethargy, possible apnea/   Patient is unable to answer questions other than yes/ no.  Wheelchair bound, paresis,       patient was a snorer, but apneas were not witnessed.     Family medical /sleep history: none. Brother has sleep apnea.     Social history:  Patient is retired from Garment/textile technologist, private- and lives in a [name]  Family status is married .      Sleep habits are as follows: according to his smart watch , he sleeps 2-3 hours, but that is not possible.  Hoyer lift needed, medical bed needed.      Review of Systems: Out of a complete 14 system review, the patient complains of only the following symptoms, and all other reviewed systems are negative.:  Fatigue, sleepiness , snoring, fragmented sleep, Hypersomnia, lives in extended care.    How likely are you to doze in the following situations: 0 = not likely, 1 = slight chance, 2 = moderate chance, 3 = high chance   Sitting and Reading? Every time within 30 minutes Watching Television? " Sitting inactive in a public place (theater or meeting)?" He is always seated.    Lying down in the afternoon when circumstances permit?always  Sitting and talking to someone?0 Sitting quietly after lunch without alcohol? always   Epworth 16/ 18. Adjusted to non driving     FSS endorsed at 40/ 63 points.   Social History   Socioeconomic History   Marital status: Married    Spouse name: Herbert Pun   Number of children: 2   Years of education: BS   Highest education level: Not on file  Occupational History   Occupation: retired  Tobacco Use   Smoking status: Never   Smokeless tobacco: Never  Vaping Use   Vaping Use: Never used  Substance and Sexual Activity   Alcohol use: Never    Comment: quit 5/22   Drug use: Never   Sexual activity: Not Currently    Partners: Female  Other Topics Concern   Not on file  Social History Narrative   10/27/20 residing at [facility] care center since 09/17/20    [employer] - BS, JD. married - '67. 2 sons - '74, '77  one son is a Nurse, learning disability for [employer]; 4 grandchildren. 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 marriage in good health. End-of-life: discussed issues and provided packet with the MOST form and out of facility order.      Caffeine: decaf coffee   Social Determinants of Health   Financial Resource Strain: Low Risk  (06/27/2018)   Overall Financial Resource Strain (CARDIA)    Difficulty of Paying Living Expenses: Not hard at all  Food Insecurity: No Food Insecurity (06/27/2018)   Hunger Vital Sign    Worried About Running Out of Food in the Last Year: Never true    Ran Out of Food  in the Last Year: Never true  Transportation Needs: No Transportation Needs (06/27/2018)   PRAPARE - Hydrologist (Medical): No    Lack of Transportation (Non-Medical): No  Physical Activity: Not on file  Stress: Not on file  Social Connections: Not on file    Family History  Problem Relation Age of Onset   Macular  degeneration Mother        macular degeneration   Cancer Father        intestinal/GI   Diabetes Paternal Aunt    Heart attack Maternal Grandfather    Diabetes Brother     Past Medical History:  Diagnosis Date   Acute rheumatic heart disease, unspecified    childhood, age  87 & 4   Acute rheumatic pericarditis    Atrial fibrillation (HCC)    history   CHF (congestive heart failure) ([place])    Diverticulosis    Dysrhythmia    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   External hemorrhoids without mention of complication    H/O aortic valve replacement    H/O mitral valve replacement    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   Lesion of ulnar nerve    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    Other and unspecified hyperlipidemia    Pre-diabetes    Previous back surgery 1978, jan 2007   Psychosexual dysfunction with inhibited sexual excitement    SOB (shortness of breath)    "with heavy exercise"   Stroke ([place]) 08/2013   "I WAS IN AFIB AND THREW A CLOT, THE EFFECTS WERE TRANSITORY AND DIDNT LAST BUT FOR 30 MINUTES"    Thoracic aortic aneurysm [facility])     Past Surgical History:  Procedure Laterality Date   AORTIC AND MITRAL VALVE REPLACEMENT     09/2004   CARDIOVERSION     3 times from 2004-2006   CARDIOVERSION N/A 09/26/2013   Procedure: CARDIOVERSION;  Surgeon: Larey Dresser, MD;  Location: [facility];  Service: Cardiovascular;  Laterality: N/A;   CARDIOVERSION N/A 06/19/2014   Procedure: CARDIOVERSION;  Surgeon: Jerline Pain, MD;  Location: [facility];  Service: Cardiovascular;  Laterality: N/A;   CARDIOVERSION N/A 04/24/2017   Procedure: CARDIOVERSION;  Surgeon: Larey Dresser, MD;  Location: [facility];  Service: Cardiovascular;  Laterality: N/A;   CARDIOVERSION N/A 06/08/2017   Procedure: CARDIOVERSION;  Surgeon: Pixie Casino, MD;  Location: [facility] ENDOSCOPY;  Service: Cardiovascular;  Laterality: N/A;   CARDIOVERSION N/A 08/24/2017   Procedure: CARDIOVERSION;   Surgeon: Skeet Latch, MD;  Location: [facility] ENDOSCOPY;  Service: Cardiovascular;  Laterality: N/A;   CARDIOVERSION N/A 02/14/2018   Procedure: CARDIOVERSION;  Surgeon: Larey Dresser, MD;  Location: [facility] ENDOSCOPY;  Service: Cardiovascular;  Laterality: N/A;   COLONOSCOPY     CRANIOTOMY N/A 07/28/2020   Procedure: CRANIOTOMY FOR EVACUATION OF SUBDURAL HEMATOMA;  Surgeon: Eustace Menges, MD;  Location: [facility];  Service: Neurosurgery;  Laterality: N/A;   IR ANGIO VERTEBRAL SEL SUBCLAVIAN INNOMINATE UNI R MOD SED  08/07/2020   IR CT HEAD LTD  08/07/2020   IR PERCUTANEOUS ART THROMBECTOMY/INFUSION INTRACRANIAL INC DIAG ANGIO  08/07/2020   laminectomies     10/1975 and in 03/2005   RADIOLOGY WITH ANESTHESIA N/A 08/07/2020   Procedure: IR WITH ANESTHESIA;  Surgeon: Luanne Bras, MD;  Location: [place];  Service: Radiology;  Laterality: N/A;   TEE WITHOUT CARDIOVERSION N/A 12/18/2020   Procedure: TRANSESOPHAGEAL ECHOCARDIOGRAM (TEE);  Surgeon: Larey Dresser, MD;  Location: [facility] ENDOSCOPY;  Service: Cardiovascular;  Laterality: N/A;   [place] Right 04/03/2018   Procedure: TOTAL HIP ARTHROPLASTY ANTERIOR APPROACH;  Surgeon: Paralee Cancel, MD;  Location: [facility];  Service: Orthopedics;  Laterality: Right;  70 mins     Current Outpatient Medications on File Prior to Visit  Medication Sig Dispense Refill   acetaminophen (TYLENOL) 325 MG tablet Take 650 mg by mouth as needed for mild pain.     Amantadine HCl 100 MG tablet Take 100 mg by mouth 2 (two) times daily.     amoxicillin (AMOXIL) 500 MG capsule Take 500 mg by mouth 2 (two) times daily.     apixaban (ELIQUIS) 5 MG TABS tablet Take 1 tablet (5 mg total) by mouth 2 (two) times daily. 60 tablet 0   Artificial Saliva (BIOTENE MOISTURIZING MOUTH MT) Use as directed 2 sprays in the mouth or throat in the morning, at noon, and at bedtime.     atorvastatin (LIPITOR) 80 MG tablet Take 1 tablet (80 mg total) by mouth  daily.     bisacodyl (DULCOLAX) 10 MG suppository Place 1 suppository (10 mg total) rectally daily as needed for moderate constipation or mild constipation.     clobetasol (TEMOVATE) 0.05 % external solution Apply 1 application  topically as needed (itching).     diazePAM, 15 MG Dose, (VALTOCO 15 MG DOSE) 2 x 7.5 MG/0.1ML LQPK Place 7.5 mg into each nostril (total of 15 mg) for grand mal seizure lasting over 5 minutes. Can repeat once in 4 hours. Do Not use more than twice in 24 hours 2 each 1   dofetilide (TIKOSYN) 125 MCG capsule Take 3 capsules (375 mcg total) by mouth 2 (two) times daily.     famotidine (PEPCID) 20 MG tablet Take 20 mg by mouth at bedtime as needed for indigestion.     finasteride (PROSCAR) 5 MG tablet Take 1 tablet (5 mg total) by mouth daily. 30 tablet 0   Glucerna (GLUCERNA) LIQD Take 237 mLs by mouth daily.     hydrocortisone 2.5 % cream Apply 1 application. topically daily as needed (itching).     lacosamide (VIMPAT) 10 MG/ML oral solution Take 5 mLs (50 mg total) by mouth in the morning. 600 mL 1   lacosamide (VIMPAT) 10 MG/ML oral solution Take 7.5 mLs (75 mg total) by mouth at bedtime. 600 mL 3   levalbuterol (XOPENEX) 0.63 MG/3ML nebulizer solution Take 0.63 mg by nebulization every 6 (six) hours as needed for wheezing or shortness of breath.     magnesium gluconate (MAGONATE) 500 MG tablet Take 1 tablet (500 mg total) by mouth daily. 30 tablet 0   melatonin 5 MG TABS Take 5 mg by mouth at bedtime.     methenamine (MANDELAMINE) 1 g tablet Take 1,000 mg by mouth 2 (two) times daily.     methylphenidate (RITALIN) 5 MG tablet Take 1 tablet (5 mg total) by mouth 2 (two) times daily. Take at 7 am and 12 pm 60 tablet 0   Multiple Vitamin (MULTIVITAMIN WITH MINERALS) TABS tablet Take 1 tablet by mouth daily.     omeprazole (PRILOSEC) 20 MG capsule Take 20 mg by mouth daily.     Polyethyl Glycol-Propyl Glycol (SYSTANE) 0.4-0.3 % SOLN Place 2 drops into both eyes daily.      polyethylene glycol (MIRALAX / GLYCOLAX) 17 g packet Take 17 g by mouth every  other day. 14 each    potassium chloride (KLOR-CON M) 10 MEQ tablet Take 20 mEq by mouth daily.     senna-docusate (SENOKOT-S) 8.6-50 MG tablet Take 1 tablet by mouth at bedtime as needed.     Torsemide 40 MG TABS Take 40 mg by mouth daily. 30 tablet 0   No current facility-administered medications on file prior to visit.    Allergies  Allergen Reactions   Naproxen Hives   No [facility] Food Allergies Other (See Comments)    Scallops - distress, nausea and vomitting    Physical exam:  Today's Vitals   04/26/22 1050  BP: 112/70  Pulse: 64  Weight: 145 lb (65.8 kg)  Height: 5' 7"$  (1.702 m)   Body mass index is 22.71 kg/m.   Wt Readings from Last 3 Encounters:  04/26/22 145 lb (65.8 kg)  04/08/22 140 lb (63.5 kg)  03/10/22 138 lb 12.8 oz (63 kg)     Ht Readings from Last 3 Encounters:  04/26/22 5' 7"$  (1.702 m)  04/20/22 5' 7"$  (1.702 m)  03/10/22 5' 7"$  (1.702 m)      General: The patient is awake, alert and appears not in acute distress. The patient is well groomed. Head: Normocephalic, atraumatic.  Neck is limited ROM Mallampati 2,  neck circumference:15.75 inches . Nasal airflow  patent.  Retrognathia is not  seen. Left si oft palate is lower.  Dental status: biological. Cardiovascular:  Regular rate and cardiac rhythm by pulse,  without distended neck veins. Respiratory: Lungs are clear to auscultation. No deep breathing.   Skin:  Without evidence of ankle edema, or rash. Trunk: The patient's posture is erect.   Neurologic exam :The patient is drowsy. Memory not tested. Speech is non fluent . Dysphonia.  Mood and affect are appropriate.   Cranial nerves: no loss of smell or taste reported  Pupils are equal and briskly reactive to light. Funduscopic exam deferred. .  Extraocular movements in vertical and horizontal planes were not following.  Visual fields by finger perimetry are  intact. Hearing was intact to soft voice and finger rubbing.    Facial sensation intact to fine touch.  left facial weakness, masked face.  Neck ROM : rotation, tilt and flexion extension were normal for age and shoulder shrug was symmetrical.    Motor exam:  Symmetric bulk, tone and ROM.   Normal tone without cog -wheeling, symmetric grip strength .   Sensory:  Fine touch and vibration were tested  and  normal.  Proprioception tested in the upper extremities was normal.   Coordination: spatic hemiparesis left and delayed relaxation on the right had, related to cerebellar lesion.  Gait and station:  deferred.  Deep tendon reflexes: in the upper and lower extremities are spastic. Babinski response was bilaterally up going.        After spending a total time of 40 minutes face to face and additional time for physical and neurologic examination, review of laboratory studies,  personal review of imaging studies, reports and results of other testing and review of referral information / records as far as provided in visit, I have established the following assessments   I today will change the fullface mask to a ResMed F10 that sits under the nose and hopefully gets a little bit better seal we still have the option of adding a chinstrap.  I am concerned that he has gotten more sleepy and his Epworth Sleepiness Scale reflects this at 19 out of 24 points  however this is also followed an increase in Vimpat since he developed seizures last summer.  Interestingly the seizure frequency has not been reduced he has 1 or 2 a month and his very first seizure was in June of last year I believe.  So in order to try to control the seizures I will order Vimpat at 50 mg in the morning and 75 mg at night will be increased now to 100 mg at night. His wife is an excellent record Armed forces training and education officer of his seizures.  There was a 3 months period  between June and September were no seizure was witnessed - but then there  were 3 in October 3 in November 1 in December and 1 in January.  He has more difficulties with swallowing which could be a side effect of higher sedation.  His left soft palate is lower than his right which fits the stroke residual findings.  He is overall less alert and less vocal.  Ritalin however seems to have helped this and on a day when he could not get his Ritalin dose he was significantly sleepy as noticed by his wife.  So this will be refilled.    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  Has central apnea and stays for now on auto BipAP.  Air leaks have made a true assessment  difficult- he was changed to a FFM F 10 by [facility].  Chin strap added as he has high residual AHIs.  Lets lok again in 30 days for download, his facility is very attentive to keep him complaint.   2) Vimpat has not reduced seizure - activity but at the cost of a high fatigue, sedation and increased dysphagia.   3) CHF bout in Nov 2023. Dr. Loralie Champagne.    4) severe disability : all related to massive stroke damage , wheelchair bound,      My Plan is to proceed with:  1)start using new FFM F 10 if needed with chin strap.  Look again at download in 30-45 days, please, can be virtual visit.   Ewporth 19/ 24 is partially responding to Ritalin.  Refilled ritalin.   Vimpat 50 mg in AM and 100 mg in PM.    I would like to thank Dr [name], Frann Rider, NP and  [name], MD and [name], Deputy [place],  [place] [id number] for allowing me to meet with and to take care of this pleasant patient.   In short, QUI SIT is presenting with Hypersomnia   follow up through NP within 2-3 months if treatment needs to be initiated.     Electronically signed by: Larey Seat, MD 04/26/2022 11:04 AM  [facility] and [facility] Board certified by The [employer] of Sleep Medicine and Diplomate of the [employer] of Sleep  Medicine. Board certified In Neurology through the [place], Fellow of the [employer] of Neurology. Medical Director of [facility].

## 2022-04-27 DIAGNOSIS — S065X3S Traumatic subdural hemorrhage with loss of consciousness of 1 hour to 5 hours 59 minutes, sequela: Secondary | ICD-10-CM | POA: Diagnosis not present

## 2022-04-27 DIAGNOSIS — G4089 Other seizures: Secondary | ICD-10-CM | POA: Diagnosis not present

## 2022-04-27 DIAGNOSIS — I5022 Chronic systolic (congestive) heart failure: Secondary | ICD-10-CM | POA: Diagnosis not present

## 2022-04-27 DIAGNOSIS — R41841 Cognitive communication deficit: Secondary | ICD-10-CM | POA: Diagnosis not present

## 2022-04-27 DIAGNOSIS — R1312 Dysphagia, oropharyngeal phase: Secondary | ICD-10-CM | POA: Diagnosis not present

## 2022-04-27 DIAGNOSIS — I69354 Hemiplegia and hemiparesis following cerebral infarction affecting left non-dominant side: Secondary | ICD-10-CM | POA: Diagnosis not present

## 2022-04-28 DIAGNOSIS — R1312 Dysphagia, oropharyngeal phase: Secondary | ICD-10-CM | POA: Diagnosis not present

## 2022-04-28 DIAGNOSIS — I69354 Hemiplegia and hemiparesis following cerebral infarction affecting left non-dominant side: Secondary | ICD-10-CM | POA: Diagnosis not present

## 2022-04-28 DIAGNOSIS — S065X3S Traumatic subdural hemorrhage with loss of consciousness of 1 hour to 5 hours 59 minutes, sequela: Secondary | ICD-10-CM | POA: Diagnosis not present

## 2022-04-28 DIAGNOSIS — I5022 Chronic systolic (congestive) heart failure: Secondary | ICD-10-CM | POA: Diagnosis not present

## 2022-04-28 DIAGNOSIS — G4089 Other seizures: Secondary | ICD-10-CM | POA: Diagnosis not present

## 2022-04-28 DIAGNOSIS — R41841 Cognitive communication deficit: Secondary | ICD-10-CM | POA: Diagnosis not present

## 2022-04-29 DIAGNOSIS — G4089 Other seizures: Secondary | ICD-10-CM | POA: Diagnosis not present

## 2022-04-29 DIAGNOSIS — I69354 Hemiplegia and hemiparesis following cerebral infarction affecting left non-dominant side: Secondary | ICD-10-CM | POA: Diagnosis not present

## 2022-04-29 DIAGNOSIS — R41841 Cognitive communication deficit: Secondary | ICD-10-CM | POA: Diagnosis not present

## 2022-04-29 DIAGNOSIS — R1312 Dysphagia, oropharyngeal phase: Secondary | ICD-10-CM | POA: Diagnosis not present

## 2022-04-29 DIAGNOSIS — S065X3S Traumatic subdural hemorrhage with loss of consciousness of 1 hour to 5 hours 59 minutes, sequela: Secondary | ICD-10-CM | POA: Diagnosis not present

## 2022-04-29 DIAGNOSIS — I5022 Chronic systolic (congestive) heart failure: Secondary | ICD-10-CM | POA: Diagnosis not present

## 2022-05-02 ENCOUNTER — Non-Acute Institutional Stay (SKILLED_NURSING_FACILITY): Payer: Medicare Other | Admitting: Internal Medicine

## 2022-05-02 DIAGNOSIS — G4089 Other seizures: Secondary | ICD-10-CM | POA: Diagnosis not present

## 2022-05-02 DIAGNOSIS — N400 Enlarged prostate without lower urinary tract symptoms: Secondary | ICD-10-CM

## 2022-05-02 DIAGNOSIS — I69354 Hemiplegia and hemiparesis following cerebral infarction affecting left non-dominant side: Secondary | ICD-10-CM | POA: Diagnosis not present

## 2022-05-02 DIAGNOSIS — T826XXD Infection and inflammatory reaction due to cardiac valve prosthesis, subsequent encounter: Secondary | ICD-10-CM | POA: Diagnosis not present

## 2022-05-02 DIAGNOSIS — I480 Paroxysmal atrial fibrillation: Secondary | ICD-10-CM

## 2022-05-02 DIAGNOSIS — R569 Unspecified convulsions: Secondary | ICD-10-CM | POA: Diagnosis not present

## 2022-05-02 DIAGNOSIS — R1312 Dysphagia, oropharyngeal phase: Secondary | ICD-10-CM | POA: Diagnosis not present

## 2022-05-02 DIAGNOSIS — E870 Hyperosmolality and hypernatremia: Secondary | ICD-10-CM

## 2022-05-02 DIAGNOSIS — I5022 Chronic systolic (congestive) heart failure: Secondary | ICD-10-CM

## 2022-05-02 DIAGNOSIS — I5033 Acute on chronic diastolic (congestive) heart failure: Secondary | ICD-10-CM | POA: Diagnosis not present

## 2022-05-02 DIAGNOSIS — G9349 Other encephalopathy: Secondary | ICD-10-CM

## 2022-05-02 DIAGNOSIS — I69398 Other sequelae of cerebral infarction: Secondary | ICD-10-CM

## 2022-05-02 DIAGNOSIS — S065X3S Traumatic subdural hemorrhage with loss of consciousness of 1 hour to 5 hours 59 minutes, sequela: Secondary | ICD-10-CM | POA: Diagnosis not present

## 2022-05-02 DIAGNOSIS — E785 Hyperlipidemia, unspecified: Secondary | ICD-10-CM | POA: Diagnosis not present

## 2022-05-02 DIAGNOSIS — G4737 Central sleep apnea in conditions classified elsewhere: Secondary | ICD-10-CM

## 2022-05-02 DIAGNOSIS — E118 Type 2 diabetes mellitus with unspecified complications: Secondary | ICD-10-CM

## 2022-05-02 DIAGNOSIS — R41841 Cognitive communication deficit: Secondary | ICD-10-CM | POA: Diagnosis not present

## 2022-05-02 DIAGNOSIS — I38 Endocarditis, valve unspecified: Secondary | ICD-10-CM

## 2022-05-02 DIAGNOSIS — N390 Urinary tract infection, site not specified: Secondary | ICD-10-CM | POA: Diagnosis not present

## 2022-05-02 NOTE — Progress Notes (Unsigned)
Location:  Occupational psychologist of Service:  SNF (31)  Provider:   Code Status: *** Goals of Care:     03/10/2022    3:45 PM  Advanced Directives  Does Patient Have a Medical Advance Directive? Yes  Type of Advance Directive Out of facility DNR (pink MOST or yellow form);Healthcare Power of Attorney  Does patient want to make changes to medical advance directive? No - Patient declined  Copy of [place] in Chart? Yes - validated most recent copy scanned in chart (See row information)     Chief Complaint  Patient presents with   Chronic Care Management    HPI: Patient is a 82 y.o. male seen today for medical management of chronic diseases.     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    Other and unspecified hyperlipidemia    Pre-diabetes    Previous back surgery 1978, jan 2007   Psychosexual dysfunction with inhibited sexual excitement    SOB (shortness of breath)    "with heavy exercise"   Stroke ([place]) 08/2013   "I WAS IN AFIB AND THREW A CLOT, THE EFFECTS WERE TRANSITORY AND DIDNT LAST BUT FOR 30 MINUTES"    Thoracic aortic aneurysm [facility])     Past Surgical History:  Procedure Laterality Date   AORTIC AND MITRAL VALVE REPLACEMENT     09/2004   CARDIOVERSION     3 times from 2004-2006   CARDIOVERSION N/A 09/26/2013   Procedure: CARDIOVERSION;  Surgeon: Larey Dresser, MD;  Location: [place];  Service: Cardiovascular;  Laterality: N/A;    CARDIOVERSION N/A 06/19/2014   Procedure: CARDIOVERSION;  Surgeon: Jerline Pain, MD;  Location: [place];  Service: Cardiovascular;  Laterality: N/A;   CARDIOVERSION N/A 04/24/2017   Procedure: CARDIOVERSION;  Surgeon: Larey Dresser, MD;  Location: [place];  Service: Cardiovascular;  Laterality: N/A;   CARDIOVERSION N/A 06/08/2017   Procedure: CARDIOVERSION;  Surgeon: Pixie Casino, MD;  Location: [facility] ENDOSCOPY;  Service: Cardiovascular;  Laterality: N/A;   CARDIOVERSION N/A 08/24/2017   Procedure: CARDIOVERSION;  Surgeon: Skeet Latch, MD;  Location: [facility] ENDOSCOPY;  Service: Cardiovascular;  Laterality: N/A;   CARDIOVERSION N/A 02/14/2018   Procedure: CARDIOVERSION;  Surgeon: Larey Dresser, MD;  Location: [facility] ENDOSCOPY;  Service: Cardiovascular;  Laterality: N/A;   COLONOSCOPY     CRANIOTOMY N/A 07/28/2020   Procedure: CRANIOTOMY FOR EVACUATION OF SUBDURAL HEMATOMA;  Surgeon: Eustace Daughtry, MD;  Location: [place];  Service: Neurosurgery;  Laterality: N/A;   IR ANGIO VERTEBRAL SEL SUBCLAVIAN INNOMINATE UNI R MOD SED  08/07/2020   IR CT HEAD LTD  08/07/2020   IR PERCUTANEOUS ART THROMBECTOMY/INFUSION INTRACRANIAL INC DIAG ANGIO  08/07/2020   laminectomies     10/1975 and in 03/2005   RADIOLOGY WITH ANESTHESIA N/A 08/07/2020   Procedure: IR WITH ANESTHESIA;  Surgeon:  Luanne Bras, MD;  Location: [place];  Service: Radiology;  Laterality: N/A;   TEE WITHOUT CARDIOVERSION N/A 12/18/2020   Procedure: TRANSESOPHAGEAL ECHOCARDIOGRAM (TEE);  Surgeon: Larey Dresser, MD;  Location: [facility] END[facility]COPY;  Service: Cardiovascular;  Laterality: N/A;   Damar Right 04/03/2018   Procedure: TOTAL HIP ARTHROPLASTY ANTERIOR APPROACH;  Surgeon: Paralee Cancel, MD;  Location: [facility];  Service: Orthopedics;  Laterality: Right;  70 mins    Allergies  Allergen Reactions   Naproxen Hives   No [facility] Food Allergies Other (See Comments)    Scallops - distress,  nausea and vomitting    Outpatient Encounter Medications as of 05/02/2022  Medication Sig   acetaminophen (TYLENOL) 325 MG tablet Take 650 mg by mouth as needed for mild pain.   Amantadine HCl 100 MG tablet Take 100 mg by mouth 2 (two) times daily.   amoxicillin (AMOXIL) 500 MG capsule Take 500 mg by mouth 2 (two) times daily.   apixaban (ELIQUIS) 5 MG TABS tablet Take 1 tablet (5 mg total) by mouth 2 (two) times daily.   Artificial Saliva (BIOTENE MOISTURIZING MOUTH MT) Use as directed 2 sprays in the mouth or throat in the morning, at noon, and at bedtime.   atorvastatin (LIPITOR) 80 MG tablet Take 1 tablet (80 mg total) by mouth daily.   bisacodyl (DULCOLAX) 10 MG suppository Place 1 suppository (10 mg total) rectally daily as needed for moderate constipation or mild constipation.   clobetasol (TEMOVATE) 0.05 % external solution Apply 1 application  topically as needed (itching).   diazePAM, 15 MG Dose, (VALTOCO 15 MG DOSE) 2 x 7.5 MG/0.1ML LQPK Place 7.5 mg into each nostril (total of 15 mg) for grand mal seizure lasting over 5 minutes. Can repeat once in 4 hours. Do Not use more than twice in 24 hours   dofetilide (TIKOSYN) 125 MCG capsule Take 3 capsules (375 mcg total) by mouth 2 (two) times daily.   famotidine (PEPCID) 20 MG tablet Take 20 mg by mouth at bedtime as needed for indigestion.   finasteride (PROSCAR) 5 MG tablet Take 1 tablet (5 mg total) by mouth daily.   Glucerna (GLUCERNA) LIQD Take 237 mLs by mouth daily.   hydrocortisone 2.5 % cream Apply 1 application. topically daily as needed (itching).   lacosamide (VIMPAT) 10 MG/ML oral solution Take 10 mLs (100 mg total) by mouth at bedtime.   lacosamide (VIMPAT) 10 MG/ML oral solution Take 5 mLs (50 mg total) by mouth in the morning.   levalbuterol (XOPENEX) 0.63 MG/3ML nebulizer solution Take 0.63 mg by nebulization every 6 (six) hours as needed for wheezing or shortness of breath.   magnesium gluconate (MAGONATE) 500 MG tablet  Take 1 tablet (500 mg total) by mouth daily.   melatonin 5 MG TABS Take 5 mg by mouth at bedtime.   methenamine (MANDELAMINE) 1 g tablet Take 1,000 mg by mouth 2 (two) times daily.   methylphenidate (RITALIN) 5 MG tablet Take 1 tablet (5 mg total) by mouth 2 (two) times daily. Take at 7 am and 12 pm   Multiple Vitamin (MULTIVITAMIN WITH MINERALS) TABS tablet Take 1 tablet by mouth daily.   omeprazole (PRILOSEC) 20 MG capsule Take 20 mg by mouth daily.   Polyethyl Glycol-Propyl Glycol (SYSTANE) 0.4-0.3 % SOLN Place 2 drops into both eyes daily.   polyethylene glycol (MIRALAX / GLYCOLAX) 17 g packet Take 17 g by mouth every other day.  potassium chloride (KLOR-CON M) 10 MEQ tablet Take 20 mEq by mouth daily.   senna-docusate (SENOKOT-S) 8.6-50 MG tablet Take 1 tablet by mouth at bedtime as needed.   Torsemide 40 MG TABS Take 40 mg by mouth daily.   No facility-administered encounter medications on file as of 05/02/2022.    Review of Systems:  Review of Systems  Health Maintenance  Topic Date Due   Diabetic kidney evaluation - Urine ACR  Never done   Medicare Annual Wellness (AWV)  07/10/2020   COVID-19 Vaccine (4 - 2023-24 season) 11/05/2021   HEMOGLOBIN A1C  01/05/2022   OPHTHALMOLOGY EXAM  10/06/2022 (Originally 10/04/1950)   FOOT EXAM  10/23/2022   Diabetic kidney evaluation - eGFR measurement  03/08/2023   DTaP/Tdap/Td (4 - Td or Tdap) 03/22/2028   Pneumonia Vaccine 65+ Years old  Completed   INFLUENZA VACCINE  Completed   Zoster Vaccines- Shingrix  Completed   HPV VACCINES  Aged Out   COLONOSCOPY (Pts 45-68yr Insurance coverage will need to be confirmed)  Discontinued   Hepatitis C Screening  Discontinued    Physical Exam: There were no vitals filed for this visit. There is no height or weight on file to calculate BMI. Physical Exam  Labs reviewed: Basic Metabolic Panel: Recent Labs    08/11/21 0408 08/12/21 0107 08/16/21 0000 12/13/21 1157 12/14/21 0000  01/03/22 1322 01/10/22 0000 01/20/22 0000 02/02/22 1202 03/07/22 0000  NA 140 142   < >  --    < > 144   < > 143 145 149*  K 3.6 3.9   < >  --    < > 3.7   < > 3.5 3.6 3.8  CL 109 113*   < >  --    < > 108   < > 104 107 109*  CO2 24 23   < >  --    < > 25   < > 27* 28 28*  GLUCOSE 120* 143*  --   --   --  120*  --   --  126*  --   BUN 17 17   < >  --    < > 27*   < > 28* 32* 34*  CREATININE 0.81 0.83   < >  --    < > 1.18   < > 0.9 1.14 1.1  CALCIUM 8.6* 8.7*   < >  --    < > 9.1   < > 9.7 9.2 9.3  MG 1.9 2.0  --  2.4  --   --   --   --   --   --    < > = values in this interval not displayed.   Liver Function Tests: Recent Labs    08/11/21 0408 09/07/21 0000 09/08/21 0000 01/03/22 1322 02/02/22 1202  AST '27 24 24 25 22  '$ ALT '23 18 18 20 23  '$ ALKPHOS 80 105 105 100  --   BILITOT 0.8  --   --  1.1 0.9  PROT 5.8*  --   --  7.4 7.1  ALBUMIN 3.1* 3.5 3.5 3.6  --    No results for input(s): "LIPASE", "AMYLASE" in the last 8760 hours. No results for input(s): "AMMONIA" in the last 8760 hours. CBC: Recent Labs    08/10/21 0937 08/10/21 0957 08/11/21 0408 08/12/21 0107 09/07/21 0000 12/06/21 0000 01/20/22 0000 02/02/22 1202  WBC 16.1*  --  9.1 10.2   < > 8.1 10.2 9.4  NEUTROABS 14.3*  --   --  7.8*  --   --   --  6,110  HGB 10.0*   < > 9.4* 9.4*   < > 9.2* 9.5* 9.0*  HCT 31.6*   < > 28.7* 29.8*   < > 28* 30* 28.3*  MCV 96.3  --  92.0 94.3  --   --   --  86.0  PLT 238  --  218 227   < > 338 253 261   < > = values in this interval not displayed.   Lipid Panel: Recent Labs    07/05/21 0000  CHOL 110  HDL 43  LDLCALC 57  TRIG 51   Lab Results  Component Value Date   HGBA1C 5.7 07/05/2021    Procedures since last visit: No results found.  Assessment/Plan There are no diagnoses linked to this encounter.   Labs/tests ordered:  * No order type specified * Next appt:  Visit date not found

## 2022-05-03 DIAGNOSIS — S065X3S Traumatic subdural hemorrhage with loss of consciousness of 1 hour to 5 hours 59 minutes, sequela: Secondary | ICD-10-CM | POA: Diagnosis not present

## 2022-05-03 DIAGNOSIS — I5022 Chronic systolic (congestive) heart failure: Secondary | ICD-10-CM | POA: Diagnosis not present

## 2022-05-03 DIAGNOSIS — R41841 Cognitive communication deficit: Secondary | ICD-10-CM | POA: Diagnosis not present

## 2022-05-03 DIAGNOSIS — R1312 Dysphagia, oropharyngeal phase: Secondary | ICD-10-CM | POA: Diagnosis not present

## 2022-05-03 DIAGNOSIS — I69354 Hemiplegia and hemiparesis following cerebral infarction affecting left non-dominant side: Secondary | ICD-10-CM | POA: Diagnosis not present

## 2022-05-03 DIAGNOSIS — G4089 Other seizures: Secondary | ICD-10-CM | POA: Diagnosis not present

## 2022-05-04 DIAGNOSIS — R1312 Dysphagia, oropharyngeal phase: Secondary | ICD-10-CM | POA: Diagnosis not present

## 2022-05-04 DIAGNOSIS — I69354 Hemiplegia and hemiparesis following cerebral infarction affecting left non-dominant side: Secondary | ICD-10-CM | POA: Diagnosis not present

## 2022-05-04 DIAGNOSIS — R41841 Cognitive communication deficit: Secondary | ICD-10-CM | POA: Diagnosis not present

## 2022-05-04 DIAGNOSIS — S065X3S Traumatic subdural hemorrhage with loss of consciousness of 1 hour to 5 hours 59 minutes, sequela: Secondary | ICD-10-CM | POA: Diagnosis not present

## 2022-05-04 DIAGNOSIS — I5022 Chronic systolic (congestive) heart failure: Secondary | ICD-10-CM | POA: Diagnosis not present

## 2022-05-04 DIAGNOSIS — G4089 Other seizures: Secondary | ICD-10-CM | POA: Diagnosis not present

## 2022-05-05 ENCOUNTER — Encounter: Payer: Self-pay | Admitting: Internal Medicine

## 2022-05-05 DIAGNOSIS — R1312 Dysphagia, oropharyngeal phase: Secondary | ICD-10-CM | POA: Diagnosis not present

## 2022-05-05 DIAGNOSIS — I69354 Hemiplegia and hemiparesis following cerebral infarction affecting left non-dominant side: Secondary | ICD-10-CM | POA: Diagnosis not present

## 2022-05-05 DIAGNOSIS — G4089 Other seizures: Secondary | ICD-10-CM | POA: Diagnosis not present

## 2022-05-05 DIAGNOSIS — R41841 Cognitive communication deficit: Secondary | ICD-10-CM | POA: Diagnosis not present

## 2022-05-05 DIAGNOSIS — I5022 Chronic systolic (congestive) heart failure: Secondary | ICD-10-CM | POA: Diagnosis not present

## 2022-05-05 DIAGNOSIS — S065X3S Traumatic subdural hemorrhage with loss of consciousness of 1 hour to 5 hours 59 minutes, sequela: Secondary | ICD-10-CM | POA: Diagnosis not present

## 2022-05-06 DIAGNOSIS — R1312 Dysphagia, oropharyngeal phase: Secondary | ICD-10-CM | POA: Diagnosis not present

## 2022-05-06 DIAGNOSIS — M6389 Disorders of muscle in diseases classified elsewhere, multiple sites: Secondary | ICD-10-CM | POA: Diagnosis not present

## 2022-05-06 DIAGNOSIS — I5022 Chronic systolic (congestive) heart failure: Secondary | ICD-10-CM | POA: Diagnosis not present

## 2022-05-06 DIAGNOSIS — R41840 Attention and concentration deficit: Secondary | ICD-10-CM | POA: Diagnosis not present

## 2022-05-06 DIAGNOSIS — S065X3S Traumatic subdural hemorrhage with loss of consciousness of 1 hour to 5 hours 59 minutes, sequela: Secondary | ICD-10-CM | POA: Diagnosis not present

## 2022-05-06 DIAGNOSIS — R293 Abnormal posture: Secondary | ICD-10-CM | POA: Diagnosis not present

## 2022-05-06 DIAGNOSIS — H538 Other visual disturbances: Secondary | ICD-10-CM | POA: Diagnosis not present

## 2022-05-06 DIAGNOSIS — G4089 Other seizures: Secondary | ICD-10-CM | POA: Diagnosis not present

## 2022-05-06 DIAGNOSIS — R41841 Cognitive communication deficit: Secondary | ICD-10-CM | POA: Diagnosis not present

## 2022-05-06 DIAGNOSIS — I69354 Hemiplegia and hemiparesis following cerebral infarction affecting left non-dominant side: Secondary | ICD-10-CM | POA: Diagnosis not present

## 2022-05-06 DIAGNOSIS — R278 Other lack of coordination: Secondary | ICD-10-CM | POA: Diagnosis not present

## 2022-05-06 DIAGNOSIS — I69812 Visuospatial deficit and spatial neglect following other cerebrovascular disease: Secondary | ICD-10-CM | POA: Diagnosis not present

## 2022-05-09 DIAGNOSIS — S065X3S Traumatic subdural hemorrhage with loss of consciousness of 1 hour to 5 hours 59 minutes, sequela: Secondary | ICD-10-CM | POA: Diagnosis not present

## 2022-05-09 DIAGNOSIS — R1312 Dysphagia, oropharyngeal phase: Secondary | ICD-10-CM | POA: Diagnosis not present

## 2022-05-09 DIAGNOSIS — R41841 Cognitive communication deficit: Secondary | ICD-10-CM | POA: Diagnosis not present

## 2022-05-09 DIAGNOSIS — I5022 Chronic systolic (congestive) heart failure: Secondary | ICD-10-CM | POA: Diagnosis not present

## 2022-05-09 DIAGNOSIS — G4089 Other seizures: Secondary | ICD-10-CM | POA: Diagnosis not present

## 2022-05-09 DIAGNOSIS — I69354 Hemiplegia and hemiparesis following cerebral infarction affecting left non-dominant side: Secondary | ICD-10-CM | POA: Diagnosis not present

## 2022-05-10 DIAGNOSIS — R1312 Dysphagia, oropharyngeal phase: Secondary | ICD-10-CM | POA: Diagnosis not present

## 2022-05-10 DIAGNOSIS — I5022 Chronic systolic (congestive) heart failure: Secondary | ICD-10-CM | POA: Diagnosis not present

## 2022-05-10 DIAGNOSIS — R41841 Cognitive communication deficit: Secondary | ICD-10-CM | POA: Diagnosis not present

## 2022-05-10 DIAGNOSIS — I69354 Hemiplegia and hemiparesis following cerebral infarction affecting left non-dominant side: Secondary | ICD-10-CM | POA: Diagnosis not present

## 2022-05-10 DIAGNOSIS — G4089 Other seizures: Secondary | ICD-10-CM | POA: Diagnosis not present

## 2022-05-10 DIAGNOSIS — S065X3S Traumatic subdural hemorrhage with loss of consciousness of 1 hour to 5 hours 59 minutes, sequela: Secondary | ICD-10-CM | POA: Diagnosis not present

## 2022-05-10 DIAGNOSIS — I5033 Acute on chronic diastolic (congestive) heart failure: Secondary | ICD-10-CM | POA: Diagnosis not present

## 2022-05-11 DIAGNOSIS — R41841 Cognitive communication deficit: Secondary | ICD-10-CM | POA: Diagnosis not present

## 2022-05-11 DIAGNOSIS — I69354 Hemiplegia and hemiparesis following cerebral infarction affecting left non-dominant side: Secondary | ICD-10-CM | POA: Diagnosis not present

## 2022-05-11 DIAGNOSIS — I5022 Chronic systolic (congestive) heart failure: Secondary | ICD-10-CM | POA: Diagnosis not present

## 2022-05-11 DIAGNOSIS — G4089 Other seizures: Secondary | ICD-10-CM | POA: Diagnosis not present

## 2022-05-11 DIAGNOSIS — R1312 Dysphagia, oropharyngeal phase: Secondary | ICD-10-CM | POA: Diagnosis not present

## 2022-05-11 DIAGNOSIS — S065X3S Traumatic subdural hemorrhage with loss of consciousness of 1 hour to 5 hours 59 minutes, sequela: Secondary | ICD-10-CM | POA: Diagnosis not present

## 2022-05-12 DIAGNOSIS — G4089 Other seizures: Secondary | ICD-10-CM | POA: Diagnosis not present

## 2022-05-12 DIAGNOSIS — R1312 Dysphagia, oropharyngeal phase: Secondary | ICD-10-CM | POA: Diagnosis not present

## 2022-05-12 DIAGNOSIS — I5022 Chronic systolic (congestive) heart failure: Secondary | ICD-10-CM | POA: Diagnosis not present

## 2022-05-12 DIAGNOSIS — R41841 Cognitive communication deficit: Secondary | ICD-10-CM | POA: Diagnosis not present

## 2022-05-12 DIAGNOSIS — S065X3S Traumatic subdural hemorrhage with loss of consciousness of 1 hour to 5 hours 59 minutes, sequela: Secondary | ICD-10-CM | POA: Diagnosis not present

## 2022-05-12 DIAGNOSIS — I69354 Hemiplegia and hemiparesis following cerebral infarction affecting left non-dominant side: Secondary | ICD-10-CM | POA: Diagnosis not present

## 2022-05-13 DIAGNOSIS — G4089 Other seizures: Secondary | ICD-10-CM | POA: Diagnosis not present

## 2022-05-13 DIAGNOSIS — R41841 Cognitive communication deficit: Secondary | ICD-10-CM | POA: Diagnosis not present

## 2022-05-13 DIAGNOSIS — R1312 Dysphagia, oropharyngeal phase: Secondary | ICD-10-CM | POA: Diagnosis not present

## 2022-05-13 DIAGNOSIS — I69354 Hemiplegia and hemiparesis following cerebral infarction affecting left non-dominant side: Secondary | ICD-10-CM | POA: Diagnosis not present

## 2022-05-13 DIAGNOSIS — S065X3S Traumatic subdural hemorrhage with loss of consciousness of 1 hour to 5 hours 59 minutes, sequela: Secondary | ICD-10-CM | POA: Diagnosis not present

## 2022-05-13 DIAGNOSIS — I5022 Chronic systolic (congestive) heart failure: Secondary | ICD-10-CM | POA: Diagnosis not present

## 2022-05-16 DIAGNOSIS — S065X3S Traumatic subdural hemorrhage with loss of consciousness of 1 hour to 5 hours 59 minutes, sequela: Secondary | ICD-10-CM | POA: Diagnosis not present

## 2022-05-16 DIAGNOSIS — I5022 Chronic systolic (congestive) heart failure: Secondary | ICD-10-CM | POA: Diagnosis not present

## 2022-05-16 DIAGNOSIS — I69354 Hemiplegia and hemiparesis following cerebral infarction affecting left non-dominant side: Secondary | ICD-10-CM | POA: Diagnosis not present

## 2022-05-16 DIAGNOSIS — G4089 Other seizures: Secondary | ICD-10-CM | POA: Diagnosis not present

## 2022-05-16 DIAGNOSIS — R1312 Dysphagia, oropharyngeal phase: Secondary | ICD-10-CM | POA: Diagnosis not present

## 2022-05-16 DIAGNOSIS — R41841 Cognitive communication deficit: Secondary | ICD-10-CM | POA: Diagnosis not present

## 2022-05-17 DIAGNOSIS — I69354 Hemiplegia and hemiparesis following cerebral infarction affecting left non-dominant side: Secondary | ICD-10-CM | POA: Diagnosis not present

## 2022-05-17 DIAGNOSIS — I5022 Chronic systolic (congestive) heart failure: Secondary | ICD-10-CM | POA: Diagnosis not present

## 2022-05-17 DIAGNOSIS — R41841 Cognitive communication deficit: Secondary | ICD-10-CM | POA: Diagnosis not present

## 2022-05-17 DIAGNOSIS — I5033 Acute on chronic diastolic (congestive) heart failure: Secondary | ICD-10-CM | POA: Diagnosis not present

## 2022-05-17 DIAGNOSIS — S065X3S Traumatic subdural hemorrhage with loss of consciousness of 1 hour to 5 hours 59 minutes, sequela: Secondary | ICD-10-CM | POA: Diagnosis not present

## 2022-05-17 DIAGNOSIS — G4089 Other seizures: Secondary | ICD-10-CM | POA: Diagnosis not present

## 2022-05-17 DIAGNOSIS — R1312 Dysphagia, oropharyngeal phase: Secondary | ICD-10-CM | POA: Diagnosis not present

## 2022-05-17 LAB — COMPREHENSIVE METABOLIC PANEL: Calcium: 9.8 (ref 8.7–10.7)

## 2022-05-17 LAB — BASIC METABOLIC PANEL
BUN: 29 — AB (ref 4–21)
CO2: 25 — AB (ref 13–22)
Chloride: 102 (ref 99–108)
Creatinine: 1.2 (ref 0.6–1.3)
Glucose: 113
Potassium: 3.8 mEq/L (ref 3.5–5.1)
Sodium: 142 (ref 137–147)

## 2022-05-18 DIAGNOSIS — G4089 Other seizures: Secondary | ICD-10-CM | POA: Diagnosis not present

## 2022-05-18 DIAGNOSIS — I69354 Hemiplegia and hemiparesis following cerebral infarction affecting left non-dominant side: Secondary | ICD-10-CM | POA: Diagnosis not present

## 2022-05-18 DIAGNOSIS — R1312 Dysphagia, oropharyngeal phase: Secondary | ICD-10-CM | POA: Diagnosis not present

## 2022-05-18 DIAGNOSIS — I5022 Chronic systolic (congestive) heart failure: Secondary | ICD-10-CM | POA: Diagnosis not present

## 2022-05-18 DIAGNOSIS — S065X3S Traumatic subdural hemorrhage with loss of consciousness of 1 hour to 5 hours 59 minutes, sequela: Secondary | ICD-10-CM | POA: Diagnosis not present

## 2022-05-18 DIAGNOSIS — R41841 Cognitive communication deficit: Secondary | ICD-10-CM | POA: Diagnosis not present

## 2022-05-19 DIAGNOSIS — R41841 Cognitive communication deficit: Secondary | ICD-10-CM | POA: Diagnosis not present

## 2022-05-19 DIAGNOSIS — I69354 Hemiplegia and hemiparesis following cerebral infarction affecting left non-dominant side: Secondary | ICD-10-CM | POA: Diagnosis not present

## 2022-05-19 DIAGNOSIS — I5022 Chronic systolic (congestive) heart failure: Secondary | ICD-10-CM | POA: Diagnosis not present

## 2022-05-19 DIAGNOSIS — G4089 Other seizures: Secondary | ICD-10-CM | POA: Diagnosis not present

## 2022-05-19 DIAGNOSIS — R1312 Dysphagia, oropharyngeal phase: Secondary | ICD-10-CM | POA: Diagnosis not present

## 2022-05-19 DIAGNOSIS — S065X3S Traumatic subdural hemorrhage with loss of consciousness of 1 hour to 5 hours 59 minutes, sequela: Secondary | ICD-10-CM | POA: Diagnosis not present

## 2022-05-20 DIAGNOSIS — R1312 Dysphagia, oropharyngeal phase: Secondary | ICD-10-CM | POA: Diagnosis not present

## 2022-05-20 DIAGNOSIS — I5022 Chronic systolic (congestive) heart failure: Secondary | ICD-10-CM | POA: Diagnosis not present

## 2022-05-20 DIAGNOSIS — R41841 Cognitive communication deficit: Secondary | ICD-10-CM | POA: Diagnosis not present

## 2022-05-20 DIAGNOSIS — S065X3S Traumatic subdural hemorrhage with loss of consciousness of 1 hour to 5 hours 59 minutes, sequela: Secondary | ICD-10-CM | POA: Diagnosis not present

## 2022-05-20 DIAGNOSIS — G4089 Other seizures: Secondary | ICD-10-CM | POA: Diagnosis not present

## 2022-05-20 DIAGNOSIS — I69354 Hemiplegia and hemiparesis following cerebral infarction affecting left non-dominant side: Secondary | ICD-10-CM | POA: Diagnosis not present

## 2022-05-23 DIAGNOSIS — R41841 Cognitive communication deficit: Secondary | ICD-10-CM | POA: Diagnosis not present

## 2022-05-23 DIAGNOSIS — I5022 Chronic systolic (congestive) heart failure: Secondary | ICD-10-CM | POA: Diagnosis not present

## 2022-05-23 DIAGNOSIS — S065X3S Traumatic subdural hemorrhage with loss of consciousness of 1 hour to 5 hours 59 minutes, sequela: Secondary | ICD-10-CM | POA: Diagnosis not present

## 2022-05-23 DIAGNOSIS — G4089 Other seizures: Secondary | ICD-10-CM | POA: Diagnosis not present

## 2022-05-23 DIAGNOSIS — I69354 Hemiplegia and hemiparesis following cerebral infarction affecting left non-dominant side: Secondary | ICD-10-CM | POA: Diagnosis not present

## 2022-05-23 DIAGNOSIS — R1312 Dysphagia, oropharyngeal phase: Secondary | ICD-10-CM | POA: Diagnosis not present

## 2022-05-24 DIAGNOSIS — I5033 Acute on chronic diastolic (congestive) heart failure: Secondary | ICD-10-CM | POA: Diagnosis not present

## 2022-05-24 LAB — BASIC METABOLIC PANEL
BUN: 25 — AB (ref 4–21)
CO2: 24 — AB (ref 13–22)
Chloride: 105 (ref 99–108)
Creatinine: 1.1 (ref 0.6–1.3)
Glucose: 100
Potassium: 4.8 mEq/L (ref 3.5–5.1)
Sodium: 143 (ref 137–147)

## 2022-05-24 LAB — COMPREHENSIVE METABOLIC PANEL: Calcium: 9.7 (ref 8.7–10.7)

## 2022-05-25 DIAGNOSIS — S065X3S Traumatic subdural hemorrhage with loss of consciousness of 1 hour to 5 hours 59 minutes, sequela: Secondary | ICD-10-CM | POA: Diagnosis not present

## 2022-05-25 DIAGNOSIS — R41841 Cognitive communication deficit: Secondary | ICD-10-CM | POA: Diagnosis not present

## 2022-05-25 DIAGNOSIS — R1312 Dysphagia, oropharyngeal phase: Secondary | ICD-10-CM | POA: Diagnosis not present

## 2022-05-25 DIAGNOSIS — I69354 Hemiplegia and hemiparesis following cerebral infarction affecting left non-dominant side: Secondary | ICD-10-CM | POA: Diagnosis not present

## 2022-05-25 DIAGNOSIS — I5022 Chronic systolic (congestive) heart failure: Secondary | ICD-10-CM | POA: Diagnosis not present

## 2022-05-25 DIAGNOSIS — G4089 Other seizures: Secondary | ICD-10-CM | POA: Diagnosis not present

## 2022-05-26 DIAGNOSIS — L218 Other seborrheic dermatitis: Secondary | ICD-10-CM | POA: Diagnosis not present

## 2022-05-27 DIAGNOSIS — R1312 Dysphagia, oropharyngeal phase: Secondary | ICD-10-CM | POA: Diagnosis not present

## 2022-05-27 DIAGNOSIS — S065X3S Traumatic subdural hemorrhage with loss of consciousness of 1 hour to 5 hours 59 minutes, sequela: Secondary | ICD-10-CM | POA: Diagnosis not present

## 2022-05-27 DIAGNOSIS — R41841 Cognitive communication deficit: Secondary | ICD-10-CM | POA: Diagnosis not present

## 2022-05-27 DIAGNOSIS — G4089 Other seizures: Secondary | ICD-10-CM | POA: Diagnosis not present

## 2022-05-27 DIAGNOSIS — I69354 Hemiplegia and hemiparesis following cerebral infarction affecting left non-dominant side: Secondary | ICD-10-CM | POA: Diagnosis not present

## 2022-05-27 DIAGNOSIS — I5022 Chronic systolic (congestive) heart failure: Secondary | ICD-10-CM | POA: Diagnosis not present

## 2022-05-30 DIAGNOSIS — G4089 Other seizures: Secondary | ICD-10-CM | POA: Diagnosis not present

## 2022-05-30 DIAGNOSIS — I69354 Hemiplegia and hemiparesis following cerebral infarction affecting left non-dominant side: Secondary | ICD-10-CM | POA: Diagnosis not present

## 2022-05-30 DIAGNOSIS — S065X3S Traumatic subdural hemorrhage with loss of consciousness of 1 hour to 5 hours 59 minutes, sequela: Secondary | ICD-10-CM | POA: Diagnosis not present

## 2022-05-30 DIAGNOSIS — I5022 Chronic systolic (congestive) heart failure: Secondary | ICD-10-CM | POA: Diagnosis not present

## 2022-05-30 DIAGNOSIS — R41841 Cognitive communication deficit: Secondary | ICD-10-CM | POA: Diagnosis not present

## 2022-05-30 DIAGNOSIS — R1312 Dysphagia, oropharyngeal phase: Secondary | ICD-10-CM | POA: Diagnosis not present

## 2022-05-31 DIAGNOSIS — I69354 Hemiplegia and hemiparesis following cerebral infarction affecting left non-dominant side: Secondary | ICD-10-CM | POA: Diagnosis not present

## 2022-05-31 DIAGNOSIS — G4089 Other seizures: Secondary | ICD-10-CM | POA: Diagnosis not present

## 2022-05-31 DIAGNOSIS — I10 Essential (primary) hypertension: Secondary | ICD-10-CM | POA: Diagnosis not present

## 2022-05-31 DIAGNOSIS — R41841 Cognitive communication deficit: Secondary | ICD-10-CM | POA: Diagnosis not present

## 2022-05-31 DIAGNOSIS — S065X3S Traumatic subdural hemorrhage with loss of consciousness of 1 hour to 5 hours 59 minutes, sequela: Secondary | ICD-10-CM | POA: Diagnosis not present

## 2022-05-31 DIAGNOSIS — R1312 Dysphagia, oropharyngeal phase: Secondary | ICD-10-CM | POA: Diagnosis not present

## 2022-05-31 DIAGNOSIS — I5022 Chronic systolic (congestive) heart failure: Secondary | ICD-10-CM | POA: Diagnosis not present

## 2022-05-31 LAB — BASIC METABOLIC PANEL
BUN: 30 — AB (ref 4–21)
CO2: 23 — AB (ref 13–22)
Chloride: 105 (ref 99–108)
Creatinine: 1.1 (ref 0.6–1.3)
Glucose: 103
Potassium: 4.1 mEq/L (ref 3.5–5.1)
Sodium: 146 (ref 137–147)

## 2022-05-31 LAB — COMPREHENSIVE METABOLIC PANEL: Calcium: 9.7 (ref 8.7–10.7)

## 2022-06-01 DIAGNOSIS — G4089 Other seizures: Secondary | ICD-10-CM | POA: Diagnosis not present

## 2022-06-01 DIAGNOSIS — R41841 Cognitive communication deficit: Secondary | ICD-10-CM | POA: Diagnosis not present

## 2022-06-01 DIAGNOSIS — R1312 Dysphagia, oropharyngeal phase: Secondary | ICD-10-CM | POA: Diagnosis not present

## 2022-06-01 DIAGNOSIS — I69354 Hemiplegia and hemiparesis following cerebral infarction affecting left non-dominant side: Secondary | ICD-10-CM | POA: Diagnosis not present

## 2022-06-01 DIAGNOSIS — S065X3S Traumatic subdural hemorrhage with loss of consciousness of 1 hour to 5 hours 59 minutes, sequela: Secondary | ICD-10-CM | POA: Diagnosis not present

## 2022-06-01 DIAGNOSIS — I5022 Chronic systolic (congestive) heart failure: Secondary | ICD-10-CM | POA: Diagnosis not present

## 2022-06-03 DIAGNOSIS — I69354 Hemiplegia and hemiparesis following cerebral infarction affecting left non-dominant side: Secondary | ICD-10-CM | POA: Diagnosis not present

## 2022-06-03 DIAGNOSIS — I5022 Chronic systolic (congestive) heart failure: Secondary | ICD-10-CM | POA: Diagnosis not present

## 2022-06-03 DIAGNOSIS — R1312 Dysphagia, oropharyngeal phase: Secondary | ICD-10-CM | POA: Diagnosis not present

## 2022-06-03 DIAGNOSIS — S065X3S Traumatic subdural hemorrhage with loss of consciousness of 1 hour to 5 hours 59 minutes, sequela: Secondary | ICD-10-CM | POA: Diagnosis not present

## 2022-06-03 DIAGNOSIS — R41841 Cognitive communication deficit: Secondary | ICD-10-CM | POA: Diagnosis not present

## 2022-06-03 DIAGNOSIS — G4089 Other seizures: Secondary | ICD-10-CM | POA: Diagnosis not present

## 2022-06-06 ENCOUNTER — Non-Acute Institutional Stay (SKILLED_NURSING_FACILITY): Payer: Medicare Other | Admitting: Internal Medicine

## 2022-06-06 ENCOUNTER — Encounter: Payer: Self-pay | Admitting: Internal Medicine

## 2022-06-06 DIAGNOSIS — I69398 Other sequelae of cerebral infarction: Secondary | ICD-10-CM | POA: Diagnosis not present

## 2022-06-06 DIAGNOSIS — R41840 Attention and concentration deficit: Secondary | ICD-10-CM | POA: Diagnosis not present

## 2022-06-06 DIAGNOSIS — G4737 Central sleep apnea in conditions classified elsewhere: Secondary | ICD-10-CM | POA: Diagnosis not present

## 2022-06-06 DIAGNOSIS — I38 Endocarditis, valve unspecified: Secondary | ICD-10-CM | POA: Diagnosis not present

## 2022-06-06 DIAGNOSIS — G4089 Other seizures: Secondary | ICD-10-CM | POA: Diagnosis not present

## 2022-06-06 DIAGNOSIS — G9349 Other encephalopathy: Secondary | ICD-10-CM | POA: Diagnosis not present

## 2022-06-06 DIAGNOSIS — R569 Unspecified convulsions: Secondary | ICD-10-CM | POA: Diagnosis not present

## 2022-06-06 DIAGNOSIS — H538 Other visual disturbances: Secondary | ICD-10-CM | POA: Diagnosis not present

## 2022-06-06 DIAGNOSIS — I5022 Chronic systolic (congestive) heart failure: Secondary | ICD-10-CM

## 2022-06-06 DIAGNOSIS — T826XXD Infection and inflammatory reaction due to cardiac valve prosthesis, subsequent encounter: Secondary | ICD-10-CM

## 2022-06-06 DIAGNOSIS — I69354 Hemiplegia and hemiparesis following cerebral infarction affecting left non-dominant side: Secondary | ICD-10-CM | POA: Diagnosis not present

## 2022-06-06 DIAGNOSIS — M6389 Disorders of muscle in diseases classified elsewhere, multiple sites: Secondary | ICD-10-CM | POA: Diagnosis not present

## 2022-06-06 DIAGNOSIS — I69812 Visuospatial deficit and spatial neglect following other cerebrovascular disease: Secondary | ICD-10-CM | POA: Diagnosis not present

## 2022-06-06 DIAGNOSIS — R41841 Cognitive communication deficit: Secondary | ICD-10-CM | POA: Diagnosis not present

## 2022-06-06 DIAGNOSIS — I480 Paroxysmal atrial fibrillation: Secondary | ICD-10-CM

## 2022-06-06 DIAGNOSIS — S065X3S Traumatic subdural hemorrhage with loss of consciousness of 1 hour to 5 hours 59 minutes, sequela: Secondary | ICD-10-CM | POA: Diagnosis not present

## 2022-06-06 DIAGNOSIS — R1312 Dysphagia, oropharyngeal phase: Secondary | ICD-10-CM | POA: Diagnosis not present

## 2022-06-06 DIAGNOSIS — R293 Abnormal posture: Secondary | ICD-10-CM | POA: Diagnosis not present

## 2022-06-06 DIAGNOSIS — R278 Other lack of coordination: Secondary | ICD-10-CM | POA: Diagnosis not present

## 2022-06-06 NOTE — Progress Notes (Unsigned)
Location:   [facility] Room Number: [id number] Place of Service:  SNF [phone]) Provider:  [name], MD    Patient Care Team: [name], MD as PCP - General (Internal Medicine) Larey Dresser, MD as PCP - Cardiology (Cardiology) [name], MD as Consulting Physician (Ophthalmology) [name], MD (Internal Medicine)  Extended Emergency Contact Information Primary Emergency Contact: [name] Address: [id number]          [place], [place] [address] [place] Phone: [phone] Mobile Phone: [phone] Relation: Spouse Secondary Emergency Contact: [name],  [address] [name] of [place] Phone: [phone] Relation: Son  Code Status:  DNR Goals of care: Advanced Directive information    06/06/2022    4:34 PM  Advanced Directives  Does Patient Have a Medical Advance Directive? Yes  Type of Paramedic of [place];Out of facility DNR (pink MOST or yellow form)  Does patient want to make changes to medical advance directive? No - Patient declined  Copy of [place] in Chart? Yes - validated most recent copy scanned in chart (See row information)     Chief Complaint  Patient presents with   Acute Visit    Irregular heart rate    HPI:  Pt is a 82 y.o. 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  Acute  Breakthrough Seizure Had seizure on 05/31/22 Patient is on Vimpat but Wife does  not want her dose changed int he morning She is worried that it will make him Sleepy Irregular HR Wife thinks that he is having bradycardia We ordered EKG which shows Sinus Rhythm with HR of 72 with Few PVC He is on Eliquis Garlicky Breadth Wants me to check his Liver  He already is going to see Dentist 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 in which he has apneic breadth but then he gets normal  Uses Oxygen as needed Also on CPAP which he does not like and tries to take out form his face  He was not very responsive today Follows some commands Continues to be full care [place] lift Needs help with his feeding Wt Readings from Last 3 Encounters:  06/06/22 141 lb 3.2 oz (64 kg)  05/02/22 142 lb 9.6 oz (64.7 kg)  04/26/22 145 lb (65.8 kg)    Past Medical History:  Diagnosis Date   Acute rheumatic heart disease, unspecified    childhood, age  39 & 43   Acute rheumatic pericarditis    Atrial fibrillation    history   CHF (congestive heart failure)    Diverticulosis    Dysrhythmia    Enlarged aorta 2019   External hemorrhoids without mention of complication    H/O aortic valve replacement    H/O mitral valve replacement    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   Lesion of ulnar nerve    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  Other and unspecified hyperlipidemia    Pre-diabetes    Previous back surgery 1978, jan 2007   Psychosexual dysfunction with inhibited sexual excitement    SOB (shortness of breath)    "with heavy exercise"   Stroke 08/2013   "I WAS IN AFIB AND THREW A CLOT, THE EFFECTS WERE TRANSITORY AND DIDNT LAST BUT FOR 30 MINUTES"    Thoracic aortic aneurysm    Past Surgical History:  Procedure Laterality Date   AORTIC AND MITRAL VALVE REPLACEMENT     09/2004   CARDIOVERSION     3 times from 2004-2006   CARDIOVERSION N/A 09/26/2013   Procedure: CARDIOVERSION;  Surgeon: Larey Dresser, MD;  Location: [facility];  Service:  Cardiovascular;  Laterality: N/A;   CARDIOVERSION N/A 06/19/2014   Procedure: CARDIOVERSION;  Surgeon: Jerline Pain, MD;  Location: [facility];  Service: Cardiovascular;  Laterality: N/A;   CARDIOVERSION N/A 04/24/2017   Procedure: CARDIOVERSION;  Surgeon: Larey Dresser, MD;  Location: [facility];  Service: Cardiovascular;  Laterality: N/A;   CARDIOVERSION N/A 06/08/2017   Procedure: CARDIOVERSION;  Surgeon: Pixie Casino, MD;  Location: [facility] ENDOSCOPY;  Service: Cardiovascular;  Laterality: N/A;   CARDIOVERSION N/A 08/24/2017   Procedure: CARDIOVERSION;  Surgeon: Skeet Latch, MD;  Location[facility];  Service: Cardiovascular;  Laterality: N/A;   CARDIOVERSION N/A 02/14/2018   Procedure: CARDIOVERSION;  Surgeon: Larey Dresser, MD;  Location: [facility] ENDOSCOPY;  Service: Cardiovascular;  Laterality: N/A;   COLONOSCOPY     CRANIOTOMY N/A 07/28/2020   Procedure: CRANIOTOMY FOR EVACUATION OF SUBDURAL HEMATOMA;  Surgeon: Eustace Darrah, MD;  Location: [facility];  Service: Neurosurgery;  Laterality: N/A;   IR ANGIO VERTEBRAL SEL SUBCLAVIAN INNOMINATE UNI R MOD SED  08/07/2020   IR CT HEAD LTD  08/07/2020   IR PERCUTANEOUS ART THROMBECTOMY/INFUSION INTRACRANIAL INC DIAG ANGIO  08/07/2020   laminectomies     10/1975 and in 03/2005   RADIOLOGY WITH ANESTHESIA N/A 08/07/2020   Procedure: IR WITH ANESTHESIA;  Surgeon: Luanne Bras, MD;  Location: [facility];  Service: Radiology;  Laterality: N/A;   TEE WITHOUT CARDIOVERSION N/A 12/18/2020   Procedure: TRANSESOPHAGEAL ECHOCARDIOGRAM (TEE);  Surgeon: Larey Dresser, MD;  Location: [facility] ENDOSCOPY;  Service: Cardiovascular;  Laterality: N/A;   Juda Right 04/03/2018   Procedure: TOTAL HIP ARTHROPLASTY ANTERIOR APPROACH;  Surgeon: Paralee Cancel, MD;  Location: [facility];  Service: Orthopedics;  Laterality: Right;  70 mins    Allergies  Allergen Reactions   Naproxen Hives   No [facility] Food Allergies Other (See  Comments)    Scallops - distress, nausea and vomitting    Allergies as of 06/06/2022       Reactions   Naproxen Hives   No [facility] Food Allergies Other (See Comments)   Scallops - distress, nausea and vomitting        Medication List        Accurate as of June 06, 2022  4:34 PM. If you have any questions, ask your nurse or doctor.          acetaminophen 325 MG tablet Commonly known as: TYLENOL Take 650 mg by mouth as needed for mild pain.   Amantadine HCl 100 MG tablet Take 100 mg by mouth 2 (two) times daily.   amoxicillin 500 MG capsule Commonly known as: AMOXIL Take 500 mg by mouth 2 (two) times daily.   apixaban 5 MG Tabs tablet Commonly  known as: ELIQUIS Take 1 tablet (5 mg total) by mouth 2 (two) times daily.   atorvastatin 80 MG tablet Commonly known as: LIPITOR Take 1 tablet (80 mg total) by mouth daily.   BIOTENE MOISTURIZING MOUTH MT Use as directed 2 sprays in the mouth or throat in the morning, at noon, and at bedtime.   bisacodyl 10 MG suppository Commonly known as: DULCOLAX Place 1 suppository (10 mg total) rectally daily as needed for moderate constipation or mild constipation.   clobetasol 0.05 % external solution Commonly known as: TEMOVATE Apply 1 application  topically as needed (itching).   dofetilide 125 MCG capsule Commonly known as: TIKOSYN Take 3 capsules (375 mcg total) by mouth 2 (two) times daily.   famotidine 20 MG tablet Commonly known as: PEPCID Take 20 mg by mouth at bedtime as needed for indigestion.   finasteride 5 MG tablet Commonly known as: PROSCAR Take 1 tablet (5 mg total) by mouth daily.   Glucerna Liqd Take 237 mLs by mouth daily.   hydrocortisone 2.5 % cream Apply 1 application. topically daily as needed (itching).   lacosamide 10 MG/ML oral solution Commonly known as: Vimpat Take 10 mLs (100 mg total) by mouth at bedtime.   lacosamide 10 MG/ML oral solution Commonly known as: Vimpat Take 5 mLs  (50 mg total) by mouth in the morning.   levalbuterol 0.63 MG/3ML nebulizer solution Commonly known as: XOPENEX Take 0.63 mg by nebulization every 6 (six) hours as needed for wheezing or shortness of breath.   magnesium gluconate 500 MG tablet Commonly known as: MAGONATE Take 1 tablet (500 mg total) by mouth daily.   melatonin 5 MG Tabs Take 5 mg by mouth at bedtime.   methenamine 1 g tablet Commonly known as: MANDELAMINE Take 1,000 mg by mouth 2 (two) times daily.   methylphenidate 5 MG tablet Commonly known as: RITALIN Take 1 tablet (5 mg total) by mouth 2 (two) times daily. Take at 7 am and 12 pm   multivitamin with minerals Tabs tablet Take 1 tablet by mouth daily.   omeprazole 20 MG capsule Commonly known as: PRILOSEC Take 20 mg by mouth daily.   polyethylene glycol 17 g packet Commonly known as: MIRALAX / GLYCOLAX Take 17 g by mouth every other day.   potassium chloride 10 MEQ tablet Commonly known as: KLOR-CON M Take 20 mEq by mouth daily.   senna-docusate 8.6-50 MG tablet Commonly known as: Senokot-S Take 1 tablet by mouth at bedtime as needed.   Systane 0.4-0.3 % Soln Generic drug: Polyethyl Glycol-Propyl Glycol Place 2 drops into both eyes daily.   Torsemide 40 MG Tabs Take 40 mg by mouth daily. What changed: Another medication with the same name was removed. Continue taking this medication, and follow the directions you see here. Changed by: [name], MD   Valtoco 15 MG Dose 2 x 7.5 MG/0.1ML Lqpk Generic drug: diazePAM (15 MG Dose) Place 7.5 mg into each nostril (total of 15 mg) for grand mal seizure lasting over 5 minutes. Can repeat once in 4 hours. Do Not use more than twice in 24 hours        Review of Systems  Unable to perform ROS: Other    Immunization History  Administered Date(s) Administered   Fluad Quad(high Dose 65+) 12/06/2018, 12/09/2020, 12/10/2021   Influenza Whole 12/25/2008, 01/05/2010, 12/30/2011   Influenza, High  Dose Seasonal PF 12/14/2016   Influenza,inj,Quad PF,6+ Mos 12/19/2013   Influenza,inj,quad, With Preservative 11/22/2017  Influenza-Unspecified 12/14/2015, 12/24/2019, 12/10/2021   Moderna Covid-19 Vaccine Bivalent Booster 89yrs & up 01/12/2021   Moderna SARS-COV2 Booster Vaccination 01/02/2020, 06/26/2020   Moderna Sars-Covid-2 Vaccination 04/08/2019, 05/05/2019   Pneumococcal Conjugate-13 12/19/2013   Pneumococcal Polysaccharide-23 09/24/2007, 05/22/2017   Td 12/25/2008   Td (Adult), 2 Lf Tetanus Toxid, Preservative Free 12/25/2008   Tdap 03/22/2018   Zoster Recombinat (Shingrix) 07/16/2019, 10/28/2019   Zoster, Live 09/24/2007   Pertinent  Health Maintenance Due  Topic Date Due   HEMOGLOBIN A1C  01/05/2022   OPHTHALMOLOGY EXAM  10/06/2022 (Originally 10/04/1950)   INFLUENZA VACCINE  10/06/2022   FOOT EXAM  10/23/2022   COLONOSCOPY (Pts 45-51yrs Insurance coverage will need to be confirmed)  Discontinued      08/11/2021    7:30 PM 08/12/2021    8:00 AM 10/13/2021    2:32 PM 02/02/2022   11:24 AM 04/20/2022    3:01 PM  Fall Risk  Falls in the past year?   0 0 0  Was there an injury with Fall?    0 0  Fall Risk Category Calculator    0 0  Fall Risk Category (Retired)    Low   (RETIRED) Patient Fall Risk Level High fall risk High fall risk  High fall risk   Patient at Risk for Falls Due to    Impaired mobility   Fall risk Follow up    Falls evaluation completed    Functional Status Survey:    Vitals:   06/06/22 1621  BP: 127/87  Pulse: 65  Resp: 18  Temp: 97.8 F (36.6 C)  SpO2: 100%  Weight: 141 lb 3.2 oz (64 kg)  Height: 5\' 7"  (1.702 m)   Body mass index is 22.12 kg/m. Physical Exam Vitals reviewed.  Constitutional:      Appearance: Normal appearance.  HENT:     Head: Normocephalic.     Nose: Nose normal.     Mouth/Throat:     Mouth: Mucous membranes are moist.     Pharynx: Oropharynx is clear.  Eyes:     Pupils: Pupils are equal, round, and reactive to  light.  Cardiovascular:     Rate and Rhythm: Normal rate and regular rhythm.     Pulses: Normal pulses.     Heart sounds: No murmur heard. Pulmonary:     Effort: Pulmonary effort is normal. No respiratory distress.     Breath sounds: Normal breath sounds. No rales.  Abdominal:     General: Abdomen is flat. Bowel sounds are normal.     Palpations: Abdomen is soft.  Musculoskeletal:        General: No swelling.     Cervical back: Neck supple.  Skin:    General: Skin is warm.  Neurological:     Mental Status: He is alert.     Comments: Responds to his Name and some commands but not talking to me today Left Spastic Hemiparesis  Psychiatric:        Mood and Affect: Mood normal.        Thought Content: Thought content normal.     Labs reviewed: Recent Labs    08/11/21 0408 08/12/21 0107 08/16/21 0000 12/13/21 1157 12/14/21 0000 01/03/22 1322 01/10/22 0000 02/02/22 1202 03/07/22 0000 05/17/22 0000 05/24/22 0000  NA 140 142   < >  --    < > 144   < > 145 149* 142 143  K 3.6 3.9   < >  --    < >  3.7   < > 3.6 3.8 3.8 4.8  CL 109 113*   < >  --    < > 108   < > 107 109* 102 105  CO2 24 23   < >  --    < > 25   < > 28 28* 25* 24*  GLUCOSE 120* 143*  --   --   --  120*  --  126*  --   --   --   BUN 17 17   < >  --    < > 27*   < > 32* 34* 29* 25*  CREATININE 0.81 0.83   < >  --    < > 1.18   < > 1.14 1.1 1.2 1.1  CALCIUM 8.6* 8.7*   < >  --    < > 9.1   < > 9.2 9.3 9.8 9.7  MG 1.9 2.0  --  2.4  --   --   --   --   --   --   --    < > = values in this interval not displayed.   Recent Labs    08/11/21 0408 09/07/21 0000 09/08/21 0000 01/03/22 1322 02/02/22 1202  AST 27 24 24 25 22   ALT 23 18 18 20 23   ALKPHOS 80 105 105 100  --   BILITOT 0.8  --   --  1.1 0.9  PROT 5.8*  --   --  7.4 7.1  ALBUMIN 3.1* 3.5 3.5 3.6  --    Recent Labs    08/10/21 0937 08/10/21 0957 08/11/21 0408 08/12/21 0107 09/07/21 0000 12/06/21 0000 01/20/22 0000 02/02/22 1202  WBC 16.1*   --  9.1 10.2   < > 8.1 10.2 9.4  NEUTROABS 14.3*  --   --  7.8*  --   --   --  6,110  HGB 10.0*   < > 9.4* 9.4*   < > 9.2* 9.5* 9.0*  HCT 31.6*   < > 28.7* 29.8*   < > 28* 30* 28.3*  MCV 96.3  --  92.0 94.3  --   --   --  86.0  PLT 238  --  218 227   < > 338 253 261   < > = values in this interval not displayed.   Lab Results  Component Value Date   TSH 1.773 01/24/2021   Lab Results  Component Value Date   HGBA1C 5.7 07/05/2021   Lab Results  Component Value Date   CHOL 110 07/05/2021   HDL 43 07/05/2021   LDLCALC 57 07/05/2021   TRIG 51 07/05/2021   CHOLHDL 2.8 08/08/2020    Significant Diagnostic Results in last 30 days:  No results found.  Assessment/Plan 1. PAF (paroxysmal atrial fibrillation) EKG has shown Sinus rhythm with Few PVC and HR was 72 He is already on Eliquis Reassured wife She still want him to see Cardiology And Will make appointment with them On Tikosyn  2. Chronic systolic CHF (congestive heart failure) On Torsemide Seems to be maintained well Use PRN Torsemide if gain more weights 3. Seizures On Vimpat Had Breakthrough seizure D/W Wife about going up on Morning dose of Vimpat AS suggested bu=y Neuro She is not sure and will let me know  4. Prosthetic valve endocarditis, subsequent encounter Chronic Amoxicillin  5. 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 Encephalopathy chronic Full care in SNF On Ritalin and Amantadine  7 BPH  On Proscar And Methenamine per Urology  8. 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 Hyperlipidemia with target LDL less than 130 Lipitor Will Need follow up of his Lipids   10. Hypernatremia Gets weekly BMP per his wife request Sodium usually 0000000  11 Garlicky Mouth Odor Reassured wife He will follow with Dentist Also check LFTS     Family/ staff Communication:   Labs/tests ordered:

## 2022-06-07 ENCOUNTER — Encounter: Payer: Self-pay | Admitting: Neurology

## 2022-06-07 DIAGNOSIS — R569 Unspecified convulsions: Secondary | ICD-10-CM

## 2022-06-07 DIAGNOSIS — I5022 Chronic systolic (congestive) heart failure: Secondary | ICD-10-CM | POA: Diagnosis not present

## 2022-06-07 LAB — BASIC METABOLIC PANEL
BUN: 29 — AB (ref 4–21)
CO2: 27 — AB (ref 13–22)
Chloride: 105 (ref 99–108)
Creatinine: 1.1 (ref 0.6–1.3)
Glucose: 129
Potassium: 3.7 mEq/L (ref 3.5–5.1)
Sodium: 142 (ref 137–147)

## 2022-06-07 LAB — HEPATIC FUNCTION PANEL
ALT: 42 U/L — AB (ref 10–40)
AST: 33 (ref 14–40)
Alkaline Phosphatase: 107 (ref 25–125)
Bilirubin, Total: 1

## 2022-06-07 LAB — COMPREHENSIVE METABOLIC PANEL
Albumin: 4.2 (ref 3.5–5.0)
Calcium: 9.2 (ref 8.7–10.7)

## 2022-06-08 DIAGNOSIS — G4089 Other seizures: Secondary | ICD-10-CM | POA: Diagnosis not present

## 2022-06-08 DIAGNOSIS — R1312 Dysphagia, oropharyngeal phase: Secondary | ICD-10-CM | POA: Diagnosis not present

## 2022-06-08 DIAGNOSIS — I5022 Chronic systolic (congestive) heart failure: Secondary | ICD-10-CM | POA: Diagnosis not present

## 2022-06-08 DIAGNOSIS — I69354 Hemiplegia and hemiparesis following cerebral infarction affecting left non-dominant side: Secondary | ICD-10-CM | POA: Diagnosis not present

## 2022-06-08 DIAGNOSIS — R41841 Cognitive communication deficit: Secondary | ICD-10-CM | POA: Diagnosis not present

## 2022-06-08 DIAGNOSIS — S065X3S Traumatic subdural hemorrhage with loss of consciousness of 1 hour to 5 hours 59 minutes, sequela: Secondary | ICD-10-CM | POA: Diagnosis not present

## 2022-06-08 MED ORDER — LACOSAMIDE 10 MG/ML PO SOLN
ORAL | 1 refills | Status: DC
Start: 2022-06-08 — End: 2022-06-09

## 2022-06-09 ENCOUNTER — Other Ambulatory Visit: Payer: Self-pay

## 2022-06-09 DIAGNOSIS — R569 Unspecified convulsions: Secondary | ICD-10-CM

## 2022-06-09 MED ORDER — LACOSAMIDE 10 MG/ML PO SOLN
ORAL | 1 refills | Status: DC
Start: 2022-06-09 — End: 2022-06-30

## 2022-06-10 DIAGNOSIS — S065X3S Traumatic subdural hemorrhage with loss of consciousness of 1 hour to 5 hours 59 minutes, sequela: Secondary | ICD-10-CM | POA: Diagnosis not present

## 2022-06-10 DIAGNOSIS — I5022 Chronic systolic (congestive) heart failure: Secondary | ICD-10-CM | POA: Diagnosis not present

## 2022-06-10 DIAGNOSIS — G4089 Other seizures: Secondary | ICD-10-CM | POA: Diagnosis not present

## 2022-06-10 DIAGNOSIS — R1312 Dysphagia, oropharyngeal phase: Secondary | ICD-10-CM | POA: Diagnosis not present

## 2022-06-10 DIAGNOSIS — I69354 Hemiplegia and hemiparesis following cerebral infarction affecting left non-dominant side: Secondary | ICD-10-CM | POA: Diagnosis not present

## 2022-06-10 DIAGNOSIS — R41841 Cognitive communication deficit: Secondary | ICD-10-CM | POA: Diagnosis not present

## 2022-06-13 DIAGNOSIS — R1312 Dysphagia, oropharyngeal phase: Secondary | ICD-10-CM | POA: Diagnosis not present

## 2022-06-13 DIAGNOSIS — I69354 Hemiplegia and hemiparesis following cerebral infarction affecting left non-dominant side: Secondary | ICD-10-CM | POA: Diagnosis not present

## 2022-06-13 DIAGNOSIS — S065X3S Traumatic subdural hemorrhage with loss of consciousness of 1 hour to 5 hours 59 minutes, sequela: Secondary | ICD-10-CM | POA: Diagnosis not present

## 2022-06-13 DIAGNOSIS — I5022 Chronic systolic (congestive) heart failure: Secondary | ICD-10-CM | POA: Diagnosis not present

## 2022-06-13 DIAGNOSIS — R41841 Cognitive communication deficit: Secondary | ICD-10-CM | POA: Diagnosis not present

## 2022-06-13 DIAGNOSIS — G4089 Other seizures: Secondary | ICD-10-CM | POA: Diagnosis not present

## 2022-06-14 DIAGNOSIS — I5033 Acute on chronic diastolic (congestive) heart failure: Secondary | ICD-10-CM | POA: Diagnosis not present

## 2022-06-14 LAB — LIPID PANEL
Cholesterol: 98 (ref 0–200)
HDL: 32 — AB (ref 35–70)
LDL Cholesterol: 54
LDl/HDL Ratio: 3.1
Triglycerides: 57 (ref 40–160)

## 2022-06-15 DIAGNOSIS — I69354 Hemiplegia and hemiparesis following cerebral infarction affecting left non-dominant side: Secondary | ICD-10-CM | POA: Diagnosis not present

## 2022-06-15 DIAGNOSIS — R1312 Dysphagia, oropharyngeal phase: Secondary | ICD-10-CM | POA: Diagnosis not present

## 2022-06-15 DIAGNOSIS — G4089 Other seizures: Secondary | ICD-10-CM | POA: Diagnosis not present

## 2022-06-15 DIAGNOSIS — R41841 Cognitive communication deficit: Secondary | ICD-10-CM | POA: Diagnosis not present

## 2022-06-15 DIAGNOSIS — I5022 Chronic systolic (congestive) heart failure: Secondary | ICD-10-CM | POA: Diagnosis not present

## 2022-06-15 DIAGNOSIS — S065X3S Traumatic subdural hemorrhage with loss of consciousness of 1 hour to 5 hours 59 minutes, sequela: Secondary | ICD-10-CM | POA: Diagnosis not present

## 2022-06-17 DIAGNOSIS — S065X3S Traumatic subdural hemorrhage with loss of consciousness of 1 hour to 5 hours 59 minutes, sequela: Secondary | ICD-10-CM | POA: Diagnosis not present

## 2022-06-17 DIAGNOSIS — G4089 Other seizures: Secondary | ICD-10-CM | POA: Diagnosis not present

## 2022-06-17 DIAGNOSIS — R1312 Dysphagia, oropharyngeal phase: Secondary | ICD-10-CM | POA: Diagnosis not present

## 2022-06-17 DIAGNOSIS — I69354 Hemiplegia and hemiparesis following cerebral infarction affecting left non-dominant side: Secondary | ICD-10-CM | POA: Diagnosis not present

## 2022-06-17 DIAGNOSIS — R41841 Cognitive communication deficit: Secondary | ICD-10-CM | POA: Diagnosis not present

## 2022-06-17 DIAGNOSIS — I5022 Chronic systolic (congestive) heart failure: Secondary | ICD-10-CM | POA: Diagnosis not present

## 2022-06-20 DIAGNOSIS — I5022 Chronic systolic (congestive) heart failure: Secondary | ICD-10-CM | POA: Diagnosis not present

## 2022-06-20 DIAGNOSIS — R1312 Dysphagia, oropharyngeal phase: Secondary | ICD-10-CM | POA: Diagnosis not present

## 2022-06-20 DIAGNOSIS — I69354 Hemiplegia and hemiparesis following cerebral infarction affecting left non-dominant side: Secondary | ICD-10-CM | POA: Diagnosis not present

## 2022-06-20 DIAGNOSIS — R41841 Cognitive communication deficit: Secondary | ICD-10-CM | POA: Diagnosis not present

## 2022-06-20 DIAGNOSIS — G4089 Other seizures: Secondary | ICD-10-CM | POA: Diagnosis not present

## 2022-06-20 DIAGNOSIS — S065X3S Traumatic subdural hemorrhage with loss of consciousness of 1 hour to 5 hours 59 minutes, sequela: Secondary | ICD-10-CM | POA: Diagnosis not present

## 2022-06-21 DIAGNOSIS — R1312 Dysphagia, oropharyngeal phase: Secondary | ICD-10-CM | POA: Diagnosis not present

## 2022-06-21 DIAGNOSIS — S065X3S Traumatic subdural hemorrhage with loss of consciousness of 1 hour to 5 hours 59 minutes, sequela: Secondary | ICD-10-CM | POA: Diagnosis not present

## 2022-06-21 DIAGNOSIS — R41841 Cognitive communication deficit: Secondary | ICD-10-CM | POA: Diagnosis not present

## 2022-06-21 DIAGNOSIS — I5022 Chronic systolic (congestive) heart failure: Secondary | ICD-10-CM | POA: Diagnosis not present

## 2022-06-21 DIAGNOSIS — I69354 Hemiplegia and hemiparesis following cerebral infarction affecting left non-dominant side: Secondary | ICD-10-CM | POA: Diagnosis not present

## 2022-06-21 DIAGNOSIS — I5033 Acute on chronic diastolic (congestive) heart failure: Secondary | ICD-10-CM | POA: Diagnosis not present

## 2022-06-21 DIAGNOSIS — G4089 Other seizures: Secondary | ICD-10-CM | POA: Diagnosis not present

## 2022-06-21 LAB — BASIC METABOLIC PANEL
BUN: 28 — AB (ref 4–21)
CO2: 23 — AB (ref 13–22)
Chloride: 104 (ref 99–108)
Creatinine: 1.1 (ref 0.6–1.3)
Glucose: 102
Potassium: 3.6 mEq/L (ref 3.5–5.1)
Sodium: 141 (ref 137–147)

## 2022-06-21 LAB — COMPREHENSIVE METABOLIC PANEL: Calcium: 9.1 (ref 8.7–10.7)

## 2022-06-22 DIAGNOSIS — I69354 Hemiplegia and hemiparesis following cerebral infarction affecting left non-dominant side: Secondary | ICD-10-CM | POA: Diagnosis not present

## 2022-06-22 DIAGNOSIS — I5022 Chronic systolic (congestive) heart failure: Secondary | ICD-10-CM | POA: Diagnosis not present

## 2022-06-22 DIAGNOSIS — S065X3S Traumatic subdural hemorrhage with loss of consciousness of 1 hour to 5 hours 59 minutes, sequela: Secondary | ICD-10-CM | POA: Diagnosis not present

## 2022-06-22 DIAGNOSIS — G4089 Other seizures: Secondary | ICD-10-CM | POA: Diagnosis not present

## 2022-06-22 DIAGNOSIS — R41841 Cognitive communication deficit: Secondary | ICD-10-CM | POA: Diagnosis not present

## 2022-06-22 DIAGNOSIS — R1312 Dysphagia, oropharyngeal phase: Secondary | ICD-10-CM | POA: Diagnosis not present

## 2022-06-23 ENCOUNTER — Other Ambulatory Visit: Payer: Self-pay | Admitting: Adult Health

## 2022-06-23 DIAGNOSIS — G4089 Other seizures: Secondary | ICD-10-CM | POA: Diagnosis not present

## 2022-06-23 DIAGNOSIS — R41841 Cognitive communication deficit: Secondary | ICD-10-CM | POA: Diagnosis not present

## 2022-06-23 DIAGNOSIS — R1312 Dysphagia, oropharyngeal phase: Secondary | ICD-10-CM | POA: Diagnosis not present

## 2022-06-23 DIAGNOSIS — S065X3S Traumatic subdural hemorrhage with loss of consciousness of 1 hour to 5 hours 59 minutes, sequela: Secondary | ICD-10-CM | POA: Diagnosis not present

## 2022-06-23 DIAGNOSIS — I5022 Chronic systolic (congestive) heart failure: Secondary | ICD-10-CM | POA: Diagnosis not present

## 2022-06-23 DIAGNOSIS — I69354 Hemiplegia and hemiparesis following cerebral infarction affecting left non-dominant side: Secondary | ICD-10-CM | POA: Diagnosis not present

## 2022-06-23 MED ORDER — METHYLPHENIDATE HCL 5 MG PO TABS: 5.0000 mg | ORAL_TABLET | Freq: Two times a day (BID) | ORAL | 0 refills | Status: DC

## 2022-06-24 DIAGNOSIS — R41841 Cognitive communication deficit: Secondary | ICD-10-CM | POA: Diagnosis not present

## 2022-06-24 DIAGNOSIS — G4089 Other seizures: Secondary | ICD-10-CM | POA: Diagnosis not present

## 2022-06-24 DIAGNOSIS — S065X3S Traumatic subdural hemorrhage with loss of consciousness of 1 hour to 5 hours 59 minutes, sequela: Secondary | ICD-10-CM | POA: Diagnosis not present

## 2022-06-24 DIAGNOSIS — I5022 Chronic systolic (congestive) heart failure: Secondary | ICD-10-CM | POA: Diagnosis not present

## 2022-06-24 DIAGNOSIS — R1312 Dysphagia, oropharyngeal phase: Secondary | ICD-10-CM | POA: Diagnosis not present

## 2022-06-24 DIAGNOSIS — I69354 Hemiplegia and hemiparesis following cerebral infarction affecting left non-dominant side: Secondary | ICD-10-CM | POA: Diagnosis not present

## 2022-06-27 DIAGNOSIS — R1312 Dysphagia, oropharyngeal phase: Secondary | ICD-10-CM | POA: Diagnosis not present

## 2022-06-27 DIAGNOSIS — I5022 Chronic systolic (congestive) heart failure: Secondary | ICD-10-CM | POA: Diagnosis not present

## 2022-06-27 DIAGNOSIS — R41841 Cognitive communication deficit: Secondary | ICD-10-CM | POA: Diagnosis not present

## 2022-06-27 DIAGNOSIS — S065X3S Traumatic subdural hemorrhage with loss of consciousness of 1 hour to 5 hours 59 minutes, sequela: Secondary | ICD-10-CM | POA: Diagnosis not present

## 2022-06-27 DIAGNOSIS — G4089 Other seizures: Secondary | ICD-10-CM | POA: Diagnosis not present

## 2022-06-27 DIAGNOSIS — I69354 Hemiplegia and hemiparesis following cerebral infarction affecting left non-dominant side: Secondary | ICD-10-CM | POA: Diagnosis not present

## 2022-06-28 DIAGNOSIS — I5033 Acute on chronic diastolic (congestive) heart failure: Secondary | ICD-10-CM | POA: Diagnosis not present

## 2022-06-28 LAB — BASIC METABOLIC PANEL
BUN: 25 — AB (ref 4–21)
CO2: 28 — AB (ref 13–22)
Chloride: 105 (ref 99–108)
Creatinine: 1 (ref 0.6–1.3)
Glucose: 107
Potassium: 4 mEq/L (ref 3.5–5.1)
Sodium: 145 (ref 137–147)

## 2022-06-28 LAB — COMPREHENSIVE METABOLIC PANEL: Calcium: 9.4 (ref 8.7–10.7)

## 2022-06-29 DIAGNOSIS — R1312 Dysphagia, oropharyngeal phase: Secondary | ICD-10-CM | POA: Diagnosis not present

## 2022-06-29 DIAGNOSIS — I5022 Chronic systolic (congestive) heart failure: Secondary | ICD-10-CM | POA: Diagnosis not present

## 2022-06-29 DIAGNOSIS — G4089 Other seizures: Secondary | ICD-10-CM | POA: Diagnosis not present

## 2022-06-29 DIAGNOSIS — S065X3S Traumatic subdural hemorrhage with loss of consciousness of 1 hour to 5 hours 59 minutes, sequela: Secondary | ICD-10-CM | POA: Diagnosis not present

## 2022-06-29 DIAGNOSIS — I69354 Hemiplegia and hemiparesis following cerebral infarction affecting left non-dominant side: Secondary | ICD-10-CM | POA: Diagnosis not present

## 2022-06-29 DIAGNOSIS — R41841 Cognitive communication deficit: Secondary | ICD-10-CM | POA: Diagnosis not present

## 2022-06-30 ENCOUNTER — Encounter: Payer: Self-pay | Admitting: Adult Health

## 2022-06-30 ENCOUNTER — Non-Acute Institutional Stay (SKILLED_NURSING_FACILITY): Payer: Medicare Other | Admitting: Adult Health

## 2022-06-30 DIAGNOSIS — G4730 Sleep apnea, unspecified: Secondary | ICD-10-CM | POA: Diagnosis not present

## 2022-06-30 DIAGNOSIS — E785 Hyperlipidemia, unspecified: Secondary | ICD-10-CM

## 2022-06-30 DIAGNOSIS — I10 Essential (primary) hypertension: Secondary | ICD-10-CM

## 2022-06-30 DIAGNOSIS — E118 Type 2 diabetes mellitus with unspecified complications: Secondary | ICD-10-CM | POA: Diagnosis not present

## 2022-06-30 DIAGNOSIS — R569 Unspecified convulsions: Secondary | ICD-10-CM | POA: Diagnosis not present

## 2022-06-30 DIAGNOSIS — I5022 Chronic systolic (congestive) heart failure: Secondary | ICD-10-CM | POA: Diagnosis not present

## 2022-06-30 NOTE — Progress Notes (Signed)
Location:  [facility] Room Number: 127 A Place of Service:  SNF [phone]) Provider:  Fletcher Anon, NP   Patient Care Team: Mahlon Gammon, MD as PCP - General (Internal Medicine) Laurey Morale, MD as PCP - Cardiology (Cardiology) Mia Creek, MD as Consulting Physician (Ophthalmology) Etta Grandchild, MD (Internal Medicine)  Extended Emergency Contact Information Primary Emergency Contact: Kae Heller Address: [address]          [place], [place] [address] [place] Home Phone: [phone] Mobile Phone: [phone] Relation: Spouse Secondary Emergency Contact: Phineas Inches, [place] [address] [place] Home Phone: [phone] Relation: Son  Code Status:  DNR Goals of care: Advanced Directive information    06/30/2022   10:54 AM  Advanced Directives  Does Patient Have a Medical Advance Directive? Yes  Type of Estate agent of [place];Out of facility DNR (pink MOST or yellow form)  Does patient want to make changes to medical advance directive? No - Patient declined  Copy of Healthcare Power of Attorney in Chart? Yes - validated most recent copy scanned in chart (See row information)     HPI:  Pt is a 82 y.o. male seen today for medical management of chronic diseases.    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  On life long amoxicillin for endocarditis   CHF: had gained 2 lbs up to 145 lbs. Using chronically oxygen for sob sats 99-100% on 2 liters.    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  Recommendation made by cardiology to increase torsemide and maintain unless NA >150 and also to consider hospice due to valvular stenosis/not a surgical candidate.   Seizures: last activity 06/28/22 with grunting and shaking.  At the beginning of April neurology increased the vimpat but this order had not been implemented yet.   Hx of DM II diet controlled. Needs A1C Lab Results  Component Value Date   HGBA1C 5.7 07/05/2021    HLD on lipitor Lab Results  Component Value Date   LDLCALC 57 07/05/2021   No longer on norvasc for HTN BP 127/72  His wife reports he seems depressed, talking less sleeping more over time.  Follows with PMR physician and receives amantadine and ritalin for alertness and injection for spasticity   Followed by neurology for sleep apnea, uses bipap. Nurse notes show he tends to take it off at night and has to be encouraged.   Functionally he requires assistance for all ADLs including dressing, bathing, feeding. Has incontinence. Remains verbal but minimally. Has furrowed brow frequently. Intermittently can f/c.    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    Other and unspecified hyperlipidemia    Pre-diabetes    Previous back surgery 1978, jan 2007   Psychosexual dysfunction with inhibited sexual excitement    SOB (shortness of breath)    "with heavy exercise"   Stroke 08/2013   "I WAS IN AFIB AND THREW A CLOT, THE  EFFECTS WERE TRANSITORY AND DIDNT LAST BUT FOR 30 MINUTES"    Thoracic aortic aneurysm    Past Surgical History:  Procedure Laterality Date   AORTIC AND MITRAL VALVE REPLACEMENT     09/2004   CARDIOVERSION     3 times from 2004-2006   CARDIOVERSION N/A 09/26/2013   Procedure: CARDIOVERSION;  Surgeon: Laurey Morale, MD;  Location: [facility] ENDOSCOPY;  Service: Cardiovascular;  Laterality: N/A;   CARDIOVERSION N/A 06/19/2014   Procedure: CARDIOVERSION;  Surgeon: Jake Bathe, MD;  Location: [facility] ENDOSCOPY;  Service: Cardiovascular;  Laterality: N/A;   CARDIOVERSION N/A 04/24/2017   Procedure: CARDIOVERSION;  Surgeon: Laurey Morale, MD;  Location: [facility] ENDOSCOPY;  Service: Cardiovascular;  Laterality: N/A;   CARDIOVERSION N/A 06/08/2017   Procedure: CARDIOVERSION;  Surgeon: Chrystie Nose, MD;  Location: [facility] ENDOSCOPY;  Service: Cardiovascular;  Laterality: N/A;   CARDIOVERSION N/A 08/24/2017   Procedure: CARDIOVERSION;  Surgeon: Chilton Si, MD;  Location: [facility] ENDOSCOPY;  Service: Cardiovascular;  Laterality: N/A;   CARDIOVERSION N/A 02/14/2018   Procedure: CARDIOVERSION;  Surgeon: Laurey Morale, MD;  Location: [facility] ENDOSCOPY;  Service: Cardiovascular;  Laterality: N/A;   COLONOSCOPY     CRANIOTOMY N/A 07/28/2020   Procedure: CRANIOTOMY FOR EVACUATION OF SUBDURAL HEMATOMA;  Surgeon: Tia Alert, MD;  Location: [facility] OR;  Service: Neurosurgery;  Laterality: N/A;   IR ANGIO VERTEBRAL SEL SUBCLAVIAN INNOMINATE UNI R MOD SED  08/07/2020   IR CT HEAD LTD  08/07/2020   IR PERCUTANEOUS ART THROMBECTOMY/INFUSION INTRACRANIAL INC DIAG ANGIO  08/07/2020   laminectomies     10/1975 and in 03/2005   RADIOLOGY WITH ANESTHESIA N/A 08/07/2020   Procedure: IR WITH ANESTHESIA;  Surgeon: Julieanne Cotton, MD;  Location: [facility] OR;  Service: Radiology;  Laterality: N/A;   TEE WITHOUT CARDIOVERSION N/A 12/18/2020   Procedure: TRANSESOPHAGEAL ECHOCARDIOGRAM (TEE);  Surgeon: Laurey Morale, MD;  Location: [facility] ENDOSCOPY;   Service: Cardiovascular;  Laterality: N/A;   TONSILLECTOMY     1950   TOTAL HIP ARTHROPLASTY Right 04/03/2018   Procedure: TOTAL HIP ARTHROPLASTY ANTERIOR APPROACH;  Surgeon: Durene Romans, MD;  Location: [facility] ORS;  Service: Orthopedics;  Laterality: Right;  70 mins    Allergies  Allergen Reactions   Naproxen Hives   No [employer] Food Allergies Other (See Comments)    Scallops - distress, nausea and vomitting    Outpatient Encounter Medications as of 06/30/2022  Medication Sig   acetaminophen (TYLENOL) 325 MG tablet Take 650 mg by mouth as needed for mild pain.   Amantadine HCl 100 MG tablet Take 100 mg by mouth 2 (two) times daily.   amoxicillin (AMOXIL) 500 MG capsule Take 500 mg by mouth 2 (two) times daily.   apixaban (ELIQUIS) 5 MG TABS tablet Take 1 tablet (5 mg total) by mouth 2 (two) times daily.   Artificial Saliva (BIOTENE MOISTURIZING MOUTH MT) Use as directed 2 sprays in the mouth or throat in the morning, at noon, and  at bedtime.   atorvastatin (LIPITOR) 80 MG tablet Take 1 tablet (80 mg total) by mouth daily.   bisacodyl (DULCOLAX) 10 MG suppository Place 1 suppository (10 mg total) rectally daily as needed for moderate constipation or mild constipation.   clobetasol (TEMOVATE) 0.05 % external solution Apply 1 application  topically as needed (itching).   diazePAM, 15 MG Dose, (VALTOCO 15 MG DOSE) 2 x 7.5 MG/0.1ML LQPK Place 7.5 mg into each nostril (total of 15 mg) for grand mal seizure lasting over 5 minutes. Can repeat once in 4 hours. Do Not use more than twice in 24 hours   dofetilide (TIKOSYN) 125 MCG capsule Take 3 capsules (375 mcg total) by mouth 2 (two) times daily.   famotidine (PEPCID) 20 MG tablet Take 20 mg by mouth at bedtime as needed for indigestion.   finasteride (PROSCAR) 5 MG tablet Take 1 tablet (5 mg total) by mouth daily.   Glucerna (GLUCERNA) LIQD Take 237 mLs by mouth daily as needed.   hydrocortisone 2.5 % cream Apply 1 application. topically daily  as needed (itching).   lacosamide (VIMPAT) 10 MG/ML oral solution Take 50 mg by mouth daily. 7:30-10:00 am   lacosamide (VIMPAT) 10 MG/ML oral solution Take 10 mLs by mouth at bedtime. See other listing for morning dose   magnesium gluconate (MAGONATE) 500 MG tablet Take 1 tablet (500 mg total) by mouth daily.   melatonin 5 MG TABS Take 5 mg by mouth at bedtime.   methenamine (MANDELAMINE) 1 g tablet Take 1,000 mg by mouth 2 (two) times daily.   methylphenidate (RITALIN) 5 MG tablet Take 1 tablet (5 mg total) by mouth 2 (two) times daily. Take at 7 am and 12 pm   Multiple Vitamin (MULTIVITAMIN WITH MINERALS) TABS tablet Take 1 tablet by mouth daily.   Nebulizers MISC by Does not apply route as needed.   omeprazole (PRILOSEC) 20 MG capsule Take 20 mg by mouth daily.   OXYGEN Inhale into the lungs as directed. Oxygen at 2L [place] - titrate for sat > or = to 90%; May leave on for SOB Every Shift - PRN; PRN 1, PRN 2, PRN 3   Polyethyl Glycol-Propyl Glycol (SYSTANE) 0.4-0.3 % SOLN Place 2 drops into both eyes daily.   polyethylene glycol (MIRALAX / GLYCOLAX) 17 g packet Take 17 g by mouth daily.   potassium chloride (KLOR-CON M) 10 MEQ tablet Take 20 mEq by mouth daily.   senna-docusate (SENOKOT-S) 8.6-50 MG tablet Take 1 tablet by mouth at bedtime as needed.   torsemide (DEMADEX) 20 MG tablet Take 20 mg by mouth as directed. Extra 20 mg of Torsemide Tuesday and Thursday each week Total of 60 mg   torsemide (DEMADEX) 20 MG tablet Take 40 mg by mouth daily. 7:30 am -10 pm   UNABLE TO FIND BiPAP  Apply BiPAP @ [facility], Make sure no water or moisture in hose (use spare hose if necessary) Reapply if taken off. Special Instructions: Check hose for moisture/water, if present use spare hose [DX: Obstructive sleep apnea (adult) (pediatric)] At Bedtime; 09:00 PM - 10:00 PM   [DISCONTINUED] lacosamide (VIMPAT) 10 MG/ML oral solution Use 75 mg in AM and 125 mg at bedtime po   [DISCONTINUED] levalbuterol (XOPENEX)  0.63 MG/3ML nebulizer solution Take 0.63 mg by nebulization every 6 (six) hours as needed for wheezing or shortness of breath.   [DISCONTINUED] polyethylene glycol (MIRALAX / GLYCOLAX) 17 g packet Take 17 g by mouth every other day. (Patient not taking:  Reported on 06/30/2022)   [DISCONTINUED] Torsemide 40 MG TABS Take 40 mg by mouth daily.   No facility-administered encounter medications on file as of 06/30/2022.    Review of Systems  Unable to perform ROS: Dementia    Immunization History  Administered Date(s) Administered   Fluad Quad(high Dose 65+) 12/06/2018, 12/09/2020, 12/10/2021   Influenza Whole 12/25/2008, 01/05/2010, 12/30/2011   Influenza, High Dose Seasonal PF 12/14/2016   Influenza,inj,Quad PF,6+ Mos 12/19/2013   Influenza,inj,quad, With Preservative 11/22/2017   Influenza-Unspecified 12/14/2015, 12/24/2019, 12/10/2021   Moderna Covid-19 Vaccine Bivalent Booster 18yrs & up 01/12/2021   Moderna SARS-COV2 Booster Vaccination 01/02/2020, 06/26/2020   Moderna Sars-Covid-2 Vaccination 04/08/2019, 05/05/2019   Pneumococcal Conjugate-13 12/19/2013   Pneumococcal Polysaccharide-23 09/24/2007, 05/22/2017   Td 12/25/2008   Td (Adult), 2 Lf Tetanus Toxid, Preservative Free 12/25/2008   Tdap 03/22/2018   Zoster Recombinat (Shingrix) 07/16/2019, 10/28/2019   Zoster, Live 09/24/2007   Pertinent  Health Maintenance Due  Topic Date Due   HEMOGLOBIN A1C  01/05/2022   OPHTHALMOLOGY EXAM  10/06/2022 (Originally 10/04/1950)   INFLUENZA VACCINE  10/06/2022   FOOT EXAM  10/23/2022   COLONOSCOPY (Pts 45-5yrs Insurance coverage will need to be confirmed)  Discontinued      08/11/2021    7:30 PM 08/12/2021    8:00 AM 10/13/2021    2:32 PM 02/02/2022   11:24 AM 04/20/2022    3:01 PM  Fall Risk  Falls in the past year?   0 0 0  Was there an injury with Fall?    0 0  Fall Risk Category Calculator    0 0  Fall Risk Category (Retired)    Low   (RETIRED) Patient Fall Risk Level High fall risk  High fall risk  High fall risk   Patient at Risk for Falls Due to    Impaired mobility   Fall risk Follow up    Falls evaluation completed    Functional Status Survey:    Vitals:   06/30/22 1053  BP: 127/72  Pulse: 63  Temp: 97.9 F (36.6 C)  SpO2: 100%  Weight: 143 lb (64.9 kg)  Height: 5\' 7"  (1.702 m)   Body mass index is 22.4 kg/m. Physical Exam Vitals and nursing note reviewed.  Constitutional:      General: He is not in acute distress.    Appearance: He is not diaphoretic.  HENT:     Head: Normocephalic and atraumatic.     Mouth/Throat:     Mouth: Mucous membranes are moist.     Pharynx: Oropharynx is clear.  Neck:     Thyroid: No thyromegaly.     Vascular: No JVD.     Trachea: No tracheal deviation.  Cardiovascular:     Rate and Rhythm: Normal rate and regular rhythm.     Heart sounds: No murmur heard. Pulmonary:     Effort: Pulmonary effort is normal. No respiratory distress.     Breath sounds: No wheezing.     Comments: Decreased bases  Abdominal:     General: Bowel sounds are normal. There is no distension.     Palpations: Abdomen is soft.     Tenderness: There is no abdominal tenderness.  Musculoskeletal:     Right lower leg: No edema.     Left lower leg: No edema.  Lymphadenopathy:     Cervical: No cervical adenopathy.  Skin:    General: Skin is warm and dry.  Neurological:     Mental Status: He  is alert.     Cranial Nerves: No cranial nerve deficit.     Comments: Left sided hemiplegia     Labs reviewed: Recent Labs    08/11/21 0408 08/12/21 0107 08/16/21 0000 12/13/21 1157 12/14/21 0000 01/03/22 1322 01/10/22 0000 02/02/22 1202 03/07/22 0000 06/07/22 0000 06/21/22 0000 06/28/22 0000  NA 140 142   < >  --    < > 144   < > 145   < > 142 141 145  K 3.6 3.9   < >  --    < > 3.7   < > 3.6   < > 3.7 3.6 4.0  CL 109 113*   < >  --    < > 108   < > 107   < > 105 104 105  CO2 24 23   < >  --    < > 25   < > 28   < > 27* 23* 28*  GLUCOSE  120* 143*  --   --   --  120*  --  126*  --   --   --   --   BUN 17 17   < >  --    < > 27*   < > 32*   < > 29* 28* 25*  CREATININE 0.81 0.83   < >  --    < > 1.18   < > 1.14   < > 1.1 1.1 1.0  CALCIUM 8.6* 8.7*   < >  --    < > 9.1   < > 9.2   < > 9.2 9.1 9.4  MG 1.9 2.0  --  2.4  --   --   --   --   --   --   --   --    < > = values in this interval not displayed.   Recent Labs    08/11/21 0408 09/07/21 0000 09/08/21 0000 01/03/22 1322 02/02/22 1202 06/07/22 0000  AST 27   < > 24 25 22  33  ALT 23   < > 18 20 23  42*  ALKPHOS 80   < > 105 100  --  107  BILITOT 0.8  --   --  1.1 0.9  --   PROT 5.8*  --   --  7.4 7.1  --   ALBUMIN 3.1*   < > 3.5 3.6  --  4.2   < > = values in this interval not displayed.   Recent Labs    08/10/21 0937 08/10/21 0957 08/11/21 0408 08/12/21 0107 09/07/21 0000 12/06/21 0000 01/20/22 0000 02/02/22 1202  WBC 16.1*  --  9.1 10.2   < > 8.1 10.2 9.4  NEUTROABS 14.3*  --   --  7.8*  --   --   --  6,110  HGB 10.0*   < > 9.4* 9.4*   < > 9.2* 9.5* 9.0*  HCT 31.6*   < > 28.7* 29.8*   < > 28* 30* 28.3*  MCV 96.3  --  92.0 94.3  --   --   --  86.0  PLT 238  --  218 227   < > 338 253 261   < > = values in this interval not displayed.   Lab Results  Component Value Date   TSH 1.773 01/24/2021   Lab Results  Component Value Date   HGBA1C 5.7 07/05/2021   Lab Results  Component Value Date  CHOL 110 07/05/2021   HDL 43 07/05/2021   LDLCALC 57 07/05/2021   TRIG 51 07/05/2021   CHOLHDL 2.8 08/08/2020    Significant Diagnostic Results in last 30 days:  No results found.  Assessment/Plan 1. Seizure [facility]) His wife is concerned about lethargy but did increase to increase vimpat 50 in the am 125 in the evening  Will f/u with neurology  2. Chronic systolic CHF (congestive heart failure) (HCC) Periodically has needed extra doses of torsemide for weight gain and sob Will add back torsemide 20 mg on Tues and Thurs and continue 40 mg  daily Followed by cardiology  Weights daily   3. Sleep apnea treated with nocturnal BiPAP Followed by neurology  Wears bipap nightly, some issues keeping it on  4. Hyperlipidemia with target LDL less than 130 On lipitor   5. Essential hypertension Controlled  6. Controlled type 2 diabetes mellitus with complication, without long-term current use of insulin (HCC) Needs A1C Diet controlled    Family/ staff Communication: discussed with Nicole Kindred  Labs/tests ordered:  Has BMP drawn weekly, add TSH, lipid (already ordered), and A1C Would not order urine micro due to incontinence

## 2022-07-01 ENCOUNTER — Encounter: Payer: Self-pay | Admitting: Adult Health

## 2022-07-01 DIAGNOSIS — I5022 Chronic systolic (congestive) heart failure: Secondary | ICD-10-CM | POA: Diagnosis not present

## 2022-07-01 DIAGNOSIS — I69354 Hemiplegia and hemiparesis following cerebral infarction affecting left non-dominant side: Secondary | ICD-10-CM | POA: Diagnosis not present

## 2022-07-01 DIAGNOSIS — S065X3S Traumatic subdural hemorrhage with loss of consciousness of 1 hour to 5 hours 59 minutes, sequela: Secondary | ICD-10-CM | POA: Diagnosis not present

## 2022-07-01 DIAGNOSIS — R1312 Dysphagia, oropharyngeal phase: Secondary | ICD-10-CM | POA: Diagnosis not present

## 2022-07-01 DIAGNOSIS — G4089 Other seizures: Secondary | ICD-10-CM | POA: Diagnosis not present

## 2022-07-01 DIAGNOSIS — R41841 Cognitive communication deficit: Secondary | ICD-10-CM | POA: Diagnosis not present

## 2022-07-04 DIAGNOSIS — R1312 Dysphagia, oropharyngeal phase: Secondary | ICD-10-CM | POA: Diagnosis not present

## 2022-07-04 DIAGNOSIS — I69354 Hemiplegia and hemiparesis following cerebral infarction affecting left non-dominant side: Secondary | ICD-10-CM | POA: Diagnosis not present

## 2022-07-04 DIAGNOSIS — S065X3S Traumatic subdural hemorrhage with loss of consciousness of 1 hour to 5 hours 59 minutes, sequela: Secondary | ICD-10-CM | POA: Diagnosis not present

## 2022-07-04 DIAGNOSIS — I5022 Chronic systolic (congestive) heart failure: Secondary | ICD-10-CM | POA: Diagnosis not present

## 2022-07-04 DIAGNOSIS — G4089 Other seizures: Secondary | ICD-10-CM | POA: Diagnosis not present

## 2022-07-04 DIAGNOSIS — R41841 Cognitive communication deficit: Secondary | ICD-10-CM | POA: Diagnosis not present

## 2022-07-05 DIAGNOSIS — E1149 Type 2 diabetes mellitus with other diabetic neurological complication: Secondary | ICD-10-CM | POA: Diagnosis not present

## 2022-07-05 LAB — BASIC METABOLIC PANEL
BUN: 24 — AB (ref 4–21)
CO2: 24 — AB (ref 13–22)
Chloride: 106 (ref 99–108)
Creatinine: 1 (ref 0.6–1.3)
Glucose: 95
Potassium: 4.1 mEq/L (ref 3.5–5.1)
Sodium: 148 — AB (ref 137–147)

## 2022-07-05 LAB — COMPREHENSIVE METABOLIC PANEL
Calcium: 9.4 (ref 8.7–10.7)
eGFR: 74

## 2022-07-05 LAB — HEMOGLOBIN A1C: Hemoglobin A1C: 6.3

## 2022-07-05 LAB — TSH: TSH: 1.74 (ref 0.41–5.90)

## 2022-07-06 DIAGNOSIS — S065X3S Traumatic subdural hemorrhage with loss of consciousness of 1 hour to 5 hours 59 minutes, sequela: Secondary | ICD-10-CM | POA: Diagnosis not present

## 2022-07-06 DIAGNOSIS — R41840 Attention and concentration deficit: Secondary | ICD-10-CM | POA: Diagnosis not present

## 2022-07-06 DIAGNOSIS — M6389 Disorders of muscle in diseases classified elsewhere, multiple sites: Secondary | ICD-10-CM | POA: Diagnosis not present

## 2022-07-06 DIAGNOSIS — R1312 Dysphagia, oropharyngeal phase: Secondary | ICD-10-CM | POA: Diagnosis not present

## 2022-07-06 DIAGNOSIS — R278 Other lack of coordination: Secondary | ICD-10-CM | POA: Diagnosis not present

## 2022-07-06 DIAGNOSIS — R293 Abnormal posture: Secondary | ICD-10-CM | POA: Diagnosis not present

## 2022-07-06 DIAGNOSIS — I69812 Visuospatial deficit and spatial neglect following other cerebrovascular disease: Secondary | ICD-10-CM | POA: Diagnosis not present

## 2022-07-06 DIAGNOSIS — I69354 Hemiplegia and hemiparesis following cerebral infarction affecting left non-dominant side: Secondary | ICD-10-CM | POA: Diagnosis not present

## 2022-07-06 DIAGNOSIS — G4089 Other seizures: Secondary | ICD-10-CM | POA: Diagnosis not present

## 2022-07-06 DIAGNOSIS — R41841 Cognitive communication deficit: Secondary | ICD-10-CM | POA: Diagnosis not present

## 2022-07-06 DIAGNOSIS — I5022 Chronic systolic (congestive) heart failure: Secondary | ICD-10-CM | POA: Diagnosis not present

## 2022-07-06 DIAGNOSIS — H538 Other visual disturbances: Secondary | ICD-10-CM | POA: Diagnosis not present

## 2022-07-07 ENCOUNTER — Non-Acute Institutional Stay (SKILLED_NURSING_FACILITY): Payer: Medicare Other | Admitting: Adult Health

## 2022-07-07 ENCOUNTER — Encounter: Payer: Self-pay | Admitting: Adult Health

## 2022-07-07 DIAGNOSIS — R569 Unspecified convulsions: Secondary | ICD-10-CM | POA: Diagnosis not present

## 2022-07-07 DIAGNOSIS — I480 Paroxysmal atrial fibrillation: Secondary | ICD-10-CM

## 2022-07-07 NOTE — Progress Notes (Signed)
Location:  [facility] Room Number: 127-A Place of Service:  SNF [phone]) Provider:  Tally Joe    Patient Care Team: Mahlon Gammon, MD as PCP - General (Internal Medicine) Laurey Morale, MD as PCP - Cardiology (Cardiology) Mia Creek, MD as Consulting Physician (Ophthalmology) Etta Grandchild, MD (Internal Medicine)  Extended Emergency Contact Information Primary Emergency Contact: Kae Heller Address: [address]          [place], [place] [address] [place] Home Phone: [phone] Mobile Phone: [phone] Relation: Spouse Secondary Emergency Contact: Phineas Inches, [place] [address] [place] Home Phone: [phone] Relation: Son  Code Status:  DNR Goals of care: Advanced Directive information    07/07/2022   10:30 AM  Advanced Directives  Does Patient Have a Medical Advance Directive? Yes  Type of Estate agent of [place];Living will;Out of facility DNR (pink MOST or yellow form)  Does patient want to make changes to medical advance directive? No - Patient declined  Copy of Healthcare Power of Attorney in Chart? Yes - validated most recent copy scanned in chart (See row information)     Chief Complaint  Patient presents with   Acute Visit    Bradycardia    HPI:  Pt is a 82 y.o. male seen today for an acute visit for bradycardia. He resides in skilled care and is not able to provide the hx. PMH significant for TBI, CVA, valvular heart disease, CHF, infective endocarditis, afib, mitral and aortic valve replacement.    His wife visits regularly and reports that readings at [facility] are some times low 30-50s.  In review of matrix readings rates range 59-71.  I found three notes in the nursing notes where the reading was low. One time the HR was 50 but he was having a seizure this was on 06/28/22. Another time 4/15 the wife checked his pulse on the  pulse ox and it was 47 but when the nurse rechecked it she got 69.  On 5/1 he went out side and his oxygen was not running and his sats dropped to the 80s.  The nurse fixed the oxygen and sats returned to normal. During this time the HR 47-60.  Its not clear that the readings were done apically. Nicole Kindred his wife reports they typically use the automatic machine or the pulse machine. She is concerned that he is more sleepy with the higher dose of vimpat that he is taking for seizures and also that it could affect his HR. He has not had any syncope. No further seizures. And no mental status change per the nurse.   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   Lesion of ulnar nerve    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  Other and unspecified hyperlipidemia    Pre-diabetes    Previous back surgery 1978, jan 2007   Psychosexual dysfunction with inhibited sexual excitement    SOB (shortness of breath)    "with heavy exercise"   Stroke (HCC) 08/2013   "I WAS IN AFIB AND THREW A CLOT, THE EFFECTS WERE TRANSITORY AND DIDNT LAST BUT FOR 30 MINUTES"    Thoracic aortic aneurysm [facility])    Past Surgical History:  Procedure Laterality Date   AORTIC AND MITRAL VALVE REPLACEMENT     09/2004   CARDIOVERSION     3 times from 2004-2006   CARDIOVERSION N/A 09/26/2013   Procedure: CARDIOVERSION;  Surgeon: Laurey Morale, MD;  Location: [facility] ENDOSCOPY;  Service: Cardiovascular;  Laterality: N/A;    CARDIOVERSION N/A 06/19/2014   Procedure: CARDIOVERSION;  Surgeon: Jake Bathe, MD;  Location: [facility] ENDOSCOPY;  Service: Cardiovascular;  Laterality: N/A;   CARDIOVERSION N/A 04/24/2017   Procedure: CARDIOVERSION;  Surgeon: Laurey Morale, MD;  Location: [facility] ENDOSCOPY;  Service: Cardiovascular;  Laterality: N/A;   CARDIOVERSION N/A 06/08/2017   Procedure: CARDIOVERSION;  Surgeon: Chrystie Nose, MD;  Location: [facility] ENDOSCOPY;  Service: Cardiovascular;  Laterality: N/A;   CARDIOVERSION N/A 08/24/2017   Procedure: CARDIOVERSION;  Surgeon: Chilton Si, MD;  Location: [facility] ENDOSCOPY;  Service: Cardiovascular;  Laterality: N/A;   CARDIOVERSION N/A 02/14/2018   Procedure: CARDIOVERSION;  Surgeon: Laurey Morale, MD;  Location: [facility] ENDOSCOPY;  Service: Cardiovascular;  Laterality: N/A;   COLONOSCOPY     CRANIOTOMY N/A 07/28/2020   Procedure: CRANIOTOMY FOR EVACUATION OF SUBDURAL HEMATOMA;  Surgeon: Tia Alert, MD;  Location: [facility] OR;  Service: Neurosurgery;  Laterality: N/A;   IR ANGIO VERTEBRAL SEL SUBCLAVIAN INNOMINATE UNI R MOD SED  08/07/2020   IR CT HEAD LTD  08/07/2020   IR PERCUTANEOUS ART THROMBECTOMY/INFUSION INTRACRANIAL INC DIAG ANGIO  08/07/2020   laminectomies     10/1975 and in 03/2005   RADIOLOGY WITH ANESTHESIA N/A 08/07/2020   Procedure: IR WITH ANESTHESIA;  Surgeon: Julieanne Cotton, MD;  Location: [facility] OR;  Service: Radiology;  Laterality: N/A;   TEE WITHOUT CARDIOVERSION N/A 12/18/2020   Procedure: TRANSESOPHAGEAL ECHOCARDIOGRAM (TEE);  Surgeon: Laurey Morale, MD;  Location: [facility] ENDOSCOPY;  Service: Cardiovascular;  Laterality: N/A;   TONSILLECTOMY     1950   TOTAL HIP ARTHROPLASTY Right 04/03/2018   Procedure: TOTAL HIP ARTHROPLASTY ANTERIOR APPROACH;  Surgeon: Durene Romans, MD;  Location: [facility] ORS;  Service: Orthopedics;  Laterality: Right;  70 mins    Allergies  Allergen Reactions   Naproxen Hives   No [employer] Food Allergies Other (See Comments)    Scallops - distress,  nausea and vomitting    Outpatient Encounter Medications as of 07/07/2022  Medication Sig   acetaminophen (TYLENOL) 325 MG tablet Take 650 mg by mouth as needed for mild pain.   Amantadine HCl 100 MG tablet Take 100 mg by mouth 2 (two) times daily.   amoxicillin (AMOXIL) 500 MG capsule Take 500 mg by mouth 2 (two) times daily.   apixaban (ELIQUIS) 5 MG TABS tablet Take 1 tablet (5 mg total) by mouth 2 (two) times daily.   Artificial Saliva (BIOTENE MOISTURIZING MOUTH MT) Use as directed 2 sprays in the mouth or throat in the morning, at noon, and at bedtime.   atorvastatin (LIPITOR) 80 MG tablet Take 1 tablet (80 mg total) by mouth daily.   bisacodyl (DULCOLAX) 10 MG suppository Place 1 suppository (10 mg total) rectally daily  as needed for moderate constipation or mild constipation.   clobetasol (TEMOVATE) 0.05 % external solution Apply 1 application  topically as needed (itching).   diazePAM, 15 MG Dose, (VALTOCO 15 MG DOSE) 2 x 7.5 MG/0.1ML LQPK Place 7.5 mg into each nostril (total of 15 mg) for grand mal seizure lasting over 5 minutes. Can repeat once in 4 hours. Do Not use more than twice in 24 hours   dofetilide (TIKOSYN) 125 MCG capsule Take 3 capsules (375 mcg total) by mouth 2 (two) times daily.   famotidine (PEPCID) 20 MG tablet Take 20 mg by mouth at bedtime as needed for indigestion.   finasteride (PROSCAR) 5 MG tablet Take 1 tablet (5 mg total) by mouth daily.   hydrocortisone 2.5 % cream Apply 1 application. topically daily as needed (itching).   lacosamide (VIMPAT) 10 MG/ML oral solution Take 125 mg by mouth at bedtime. See other listing for morning dose   magnesium gluconate (MAGONATE) 500 MG tablet Take 1 tablet (500 mg total) by mouth daily.   melatonin 5 MG TABS Take 5 mg by mouth at bedtime.   methenamine (MANDELAMINE) 1 g tablet Take 1,000 mg by mouth 2 (two) times daily.   methylphenidate (RITALIN) 5 MG tablet Take 1 tablet (5 mg total) by mouth 2 (two) times daily. Take at  7 am and 12 pm   Multiple Vitamin (MULTIVITAMIN WITH MINERALS) TABS tablet Take 1 tablet by mouth daily.   Nebulizers MISC by Does not apply route as needed.   omeprazole (PRILOSEC) 20 MG capsule Take 20 mg by mouth daily.   OXYGEN Inhale into the lungs as directed. Oxygen at 2L [place] - titrate for sat > or = to 90%; May leave on for SOB Every Shift - PRN; PRN 1, PRN 2, PRN 3   Polyethyl Glycol-Propyl Glycol (SYSTANE) 0.4-0.3 % SOLN Place 2 drops into both eyes daily.   polyethylene glycol (MIRALAX / GLYCOLAX) 17 g packet Take 17 g by mouth daily.   potassium chloride (KLOR-CON M) 10 MEQ tablet Take 20 mEq by mouth daily.   senna-docusate (SENOKOT-S) 8.6-50 MG tablet Take 1 tablet by mouth at bedtime as needed.   torsemide (DEMADEX) 20 MG tablet Take 40 mg by mouth daily. 7:30 am -10 pm  Special Instructions: Extra 20 mg of Torsemide Tuesday and Thursday each week Total of 60 mg Once A Day on Tue, Thu; 08:00 AM - 10:00 AM   UNABLE TO FIND BiPAP  Apply BiPAP @ [facility], Make sure no water or moisture in hose (use spare hose if necessary) Reapply if taken off. Special Instructions: Check hose for moisture/water, if present use spare hose [DX: Obstructive sleep apnea (adult) (pediatric)] At Bedtime; 09:00 PM - 10:00 PM   Glucerna (GLUCERNA) LIQD Take 237 mLs by mouth daily as needed. (Patient not taking: Reported on 07/07/2022)   [DISCONTINUED] lacosamide (VIMPAT) 10 MG/ML oral solution Take 50 mg by mouth daily. 7:30-10:00 am   [DISCONTINUED] torsemide (DEMADEX) 20 MG tablet Take 20 mg by mouth as directed. Extra 20 mg of Torsemide Tuesday and Thursday each week Total of 60 mg   No facility-administered encounter medications on file as of 07/07/2022.    Review of Systems  Unable to perform ROS: Patient nonverbal    Immunization History  Administered Date(s) Administered   Fluad Quad(high Dose 65+) 12/06/2018, 12/09/2020, 12/10/2021   Influenza Whole 12/25/2008, 01/05/2010, 12/30/2011    Influenza, High Dose Seasonal PF 12/14/2016   Influenza,inj,Quad PF,6+ Mos 12/19/2013  Influenza,inj,quad, With Preservative 11/22/2017   Influenza-Unspecified 12/14/2015, 12/24/2019, 12/10/2021   Moderna Covid-19 Vaccine Bivalent Booster 71yrs & up 01/12/2021   Moderna SARS-COV2 Booster Vaccination 01/02/2020, 06/26/2020   Moderna Sars-Covid-2 Vaccination 04/08/2019, 05/05/2019   Pneumococcal Conjugate-13 12/19/2013   Pneumococcal Polysaccharide-23 09/24/2007, 05/22/2017   Td 12/25/2008   Td (Adult), 2 Lf Tetanus Toxid, Preservative Free 12/25/2008   Tdap 03/22/2018   Zoster Recombinat (Shingrix) 07/16/2019, 10/28/2019   Zoster, Live 09/24/2007   Pertinent  Health Maintenance Due  Topic Date Due   OPHTHALMOLOGY EXAM  10/06/2022 (Originally 10/04/1950)   INFLUENZA VACCINE  10/06/2022   FOOT EXAM  10/23/2022   HEMOGLOBIN A1C  01/04/2023   COLONOSCOPY (Pts 45-20yrs Insurance coverage will need to be confirmed)  Discontinued      08/11/2021    7:30 PM 08/12/2021    8:00 AM 10/13/2021    2:32 PM 02/02/2022   11:24 AM 04/20/2022    3:01 PM  Fall Risk  Falls in the past year?   0 0 0  Was there an injury with Fall?    0 0  Fall Risk Category Calculator    0 0  Fall Risk Category (Retired)    Low   (RETIRED) Patient Fall Risk Level High fall risk High fall risk  High fall risk   Patient at Risk for Falls Due to    Impaired mobility   Fall risk Follow up    Falls evaluation completed    Functional Status Survey:    Vitals:   07/07/22 1022  BP: 134/82  Pulse: 64  Resp: (!) 22  Temp: 97.9 F (36.6 C)  SpO2: 100%  Weight: 142 lb 11.2 oz (64.7 kg)  Height: 5\' 7"  (1.702 m)   Body mass index is 22.35 kg/m. Physical Exam Vitals and nursing note reviewed.  Constitutional:      Appearance: Normal appearance.     Comments: Eyes closed but opens eyes with verbal stim  Cardiovascular:     Rate and Rhythm: Normal rate and regular rhythm.     Heart sounds: Murmur heard.      Comments: Regular with skipped beats. HR 65 listened for one full min apical Pulmonary:     Effort: Pulmonary effort is normal.     Breath sounds: Normal breath sounds.  Abdominal:     General: Bowel sounds are normal. There is no distension.     Palpations: Abdomen is soft.     Tenderness: There is no abdominal tenderness.  Musculoskeletal:     Cervical back: No rigidity or tenderness.  Lymphadenopathy:     Cervical: No cervical adenopathy.  Neurological:     Mental Status: Mental status is at baseline.     Comments: Left sided hemiparesis.  Eyes closed responds to verbal stim  Psychiatric:        Mood and Affect: Mood normal.     Labs reviewed: Recent Labs    08/11/21 0408 08/12/21 0107 08/16/21 0000 12/13/21 1157 12/14/21 0000 01/03/22 1322 01/10/22 0000 02/02/22 1202 03/07/22 0000 06/21/22 0000 06/28/22 0000 07/05/22 0000  NA 140 142   < >  --    < > 144   < > 145   < > 141 145 148*  K 3.6 3.9   < >  --    < > 3.7   < > 3.6   < > 3.6 4.0 4.1  CL 109 113*   < >  --    < > 108   < >  107   < > 104 105 106  CO2 24 23   < >  --    < > 25   < > 28   < > 23* 28* 24*  GLUCOSE 120* 143*  --   --   --  120*  --  126*  --   --   --   --   BUN 17 17   < >  --    < > 27*   < > 32*   < > 28* 25* 24*  CREATININE 0.81 0.83   < >  --    < > 1.18   < > 1.14   < > 1.1 1.0 1.0  CALCIUM 8.6* 8.7*   < >  --    < > 9.1   < > 9.2   < > 9.1 9.4 9.4  MG 1.9 2.0  --  2.4  --   --   --   --   --   --   --   --    < > = values in this interval not displayed.   Recent Labs    08/11/21 0408 09/07/21 0000 09/08/21 0000 01/03/22 1322 02/02/22 1202 06/07/22 0000  AST 27   < > 24 25 22  33  ALT 23   < > 18 20 23  42*  ALKPHOS 80   < > 105 100  --  107  BILITOT 0.8  --   --  1.1 0.9  --   PROT 5.8*  --   --  7.4 7.1  --   ALBUMIN 3.1*   < > 3.5 3.6  --  4.2   < > = values in this interval not displayed.   Recent Labs    08/10/21 0937 08/10/21 0957 08/11/21 0408 08/12/21 0107  09/07/21 0000 01/20/22 0000 02/02/22 1202 03/14/22 0000  WBC 16.1*  --  9.1 10.2   < > 10.2 9.4 8.1  NEUTROABS 14.3*  --   --  7.8*  --   --  6,110  --   HGB 10.0*   < > 9.4* 9.4*   < > 9.5* 9.0* 10.3*  HCT 31.6*   < > 28.7* 29.8*   < > 30* 28.3* 32*  MCV 96.3  --  92.0 94.3  --   --  86.0  --   PLT 238  --  218 227   < > 253 261 240   < > = values in this interval not displayed.   Lab Results  Component Value Date   TSH 1.74 07/05/2022   Lab Results  Component Value Date   HGBA1C 6.3 07/05/2022   Lab Results  Component Value Date   CHOL 98 06/14/2022   HDL 32 (A) 06/14/2022   LDLCALC 54 06/14/2022   TRIG 57 06/14/2022   CHOLHDL 2.8 08/08/2020    Significant Diagnostic Results in last 30 days:  No results found.  Assessment/Plan  1. PAF (paroxysmal atrial fibrillation) (HCC) Concerns expressed from his wife that his pulse is running low but the staff has not validated this information with an apical pulse. HR now 65 regular with occasional skipped beat. Pt is at baseline per exam.  Will order apical pulse bid and report staff to report back to me and prn apical pulse when automatic machine shows a low reading for validation. All readings in matrix at this time range 59-71. Pt remains on tikosyn and followed by cardiology.  2.  Seizure (HCC) No new since dose increase of vimpat on 4/25.  If bradycardia is validated through further assessment will address through possible dose decrease due to possible s/e per wife concern. I would keep the vimpat dose the same at this time as he is eating and drinking well and seems at baseline.   Family/ staff Communication: discussed with Nicole Kindred via phone.   Labs/tests ordered:  BMP weekly, Magnesium added

## 2022-07-08 DIAGNOSIS — I5022 Chronic systolic (congestive) heart failure: Secondary | ICD-10-CM | POA: Diagnosis not present

## 2022-07-08 DIAGNOSIS — R1312 Dysphagia, oropharyngeal phase: Secondary | ICD-10-CM | POA: Diagnosis not present

## 2022-07-08 DIAGNOSIS — S065X3S Traumatic subdural hemorrhage with loss of consciousness of 1 hour to 5 hours 59 minutes, sequela: Secondary | ICD-10-CM | POA: Diagnosis not present

## 2022-07-08 DIAGNOSIS — R41841 Cognitive communication deficit: Secondary | ICD-10-CM | POA: Diagnosis not present

## 2022-07-08 DIAGNOSIS — G4089 Other seizures: Secondary | ICD-10-CM | POA: Diagnosis not present

## 2022-07-08 DIAGNOSIS — I69354 Hemiplegia and hemiparesis following cerebral infarction affecting left non-dominant side: Secondary | ICD-10-CM | POA: Diagnosis not present

## 2022-07-10 DIAGNOSIS — G4089 Other seizures: Secondary | ICD-10-CM | POA: Diagnosis not present

## 2022-07-10 DIAGNOSIS — R41841 Cognitive communication deficit: Secondary | ICD-10-CM | POA: Diagnosis not present

## 2022-07-10 DIAGNOSIS — R1312 Dysphagia, oropharyngeal phase: Secondary | ICD-10-CM | POA: Diagnosis not present

## 2022-07-10 DIAGNOSIS — I5022 Chronic systolic (congestive) heart failure: Secondary | ICD-10-CM | POA: Diagnosis not present

## 2022-07-10 DIAGNOSIS — S065X3S Traumatic subdural hemorrhage with loss of consciousness of 1 hour to 5 hours 59 minutes, sequela: Secondary | ICD-10-CM | POA: Diagnosis not present

## 2022-07-10 DIAGNOSIS — I69354 Hemiplegia and hemiparesis following cerebral infarction affecting left non-dominant side: Secondary | ICD-10-CM | POA: Diagnosis not present

## 2022-07-11 DIAGNOSIS — I5022 Chronic systolic (congestive) heart failure: Secondary | ICD-10-CM | POA: Diagnosis not present

## 2022-07-11 DIAGNOSIS — I69354 Hemiplegia and hemiparesis following cerebral infarction affecting left non-dominant side: Secondary | ICD-10-CM | POA: Diagnosis not present

## 2022-07-11 DIAGNOSIS — G4089 Other seizures: Secondary | ICD-10-CM | POA: Diagnosis not present

## 2022-07-11 DIAGNOSIS — R1312 Dysphagia, oropharyngeal phase: Secondary | ICD-10-CM | POA: Diagnosis not present

## 2022-07-11 DIAGNOSIS — S065X3S Traumatic subdural hemorrhage with loss of consciousness of 1 hour to 5 hours 59 minutes, sequela: Secondary | ICD-10-CM | POA: Diagnosis not present

## 2022-07-11 DIAGNOSIS — R41841 Cognitive communication deficit: Secondary | ICD-10-CM | POA: Diagnosis not present

## 2022-07-12 DIAGNOSIS — I5033 Acute on chronic diastolic (congestive) heart failure: Secondary | ICD-10-CM | POA: Diagnosis not present

## 2022-07-12 DIAGNOSIS — E860 Dehydration: Secondary | ICD-10-CM | POA: Diagnosis not present

## 2022-07-12 DIAGNOSIS — E118 Type 2 diabetes mellitus with unspecified complications: Secondary | ICD-10-CM | POA: Diagnosis not present

## 2022-07-13 DIAGNOSIS — R41841 Cognitive communication deficit: Secondary | ICD-10-CM | POA: Diagnosis not present

## 2022-07-13 DIAGNOSIS — G4089 Other seizures: Secondary | ICD-10-CM | POA: Diagnosis not present

## 2022-07-13 DIAGNOSIS — I69354 Hemiplegia and hemiparesis following cerebral infarction affecting left non-dominant side: Secondary | ICD-10-CM | POA: Diagnosis not present

## 2022-07-13 DIAGNOSIS — R1312 Dysphagia, oropharyngeal phase: Secondary | ICD-10-CM | POA: Diagnosis not present

## 2022-07-13 DIAGNOSIS — I5022 Chronic systolic (congestive) heart failure: Secondary | ICD-10-CM | POA: Diagnosis not present

## 2022-07-13 DIAGNOSIS — S065X3S Traumatic subdural hemorrhage with loss of consciousness of 1 hour to 5 hours 59 minutes, sequela: Secondary | ICD-10-CM | POA: Diagnosis not present

## 2022-07-14 ENCOUNTER — Telehealth (HOSPITAL_COMMUNITY): Payer: Self-pay

## 2022-07-14 NOTE — Telephone Encounter (Signed)
I spoke with [name] [name] at [facility] and patient's wife to update on information. Reassurance given to the wife and she felt much better by end of the call.

## 2022-07-14 NOTE — Telephone Encounter (Signed)
Wife concerned about his pulse rate, which is 58. [facility] called and they indicated he is asymptomatic, and see this pulse rate often with his A fib. They just wanted to make sure we did not want to change any medications until his visit. Please advise

## 2022-07-15 DIAGNOSIS — R41841 Cognitive communication deficit: Secondary | ICD-10-CM | POA: Diagnosis not present

## 2022-07-15 DIAGNOSIS — G4089 Other seizures: Secondary | ICD-10-CM | POA: Diagnosis not present

## 2022-07-15 DIAGNOSIS — S065X3S Traumatic subdural hemorrhage with loss of consciousness of 1 hour to 5 hours 59 minutes, sequela: Secondary | ICD-10-CM | POA: Diagnosis not present

## 2022-07-15 DIAGNOSIS — I5022 Chronic systolic (congestive) heart failure: Secondary | ICD-10-CM | POA: Diagnosis not present

## 2022-07-15 DIAGNOSIS — I69354 Hemiplegia and hemiparesis following cerebral infarction affecting left non-dominant side: Secondary | ICD-10-CM | POA: Diagnosis not present

## 2022-07-15 DIAGNOSIS — R1312 Dysphagia, oropharyngeal phase: Secondary | ICD-10-CM | POA: Diagnosis not present

## 2022-07-18 DIAGNOSIS — S065X3S Traumatic subdural hemorrhage with loss of consciousness of 1 hour to 5 hours 59 minutes, sequela: Secondary | ICD-10-CM | POA: Diagnosis not present

## 2022-07-18 DIAGNOSIS — G4089 Other seizures: Secondary | ICD-10-CM | POA: Diagnosis not present

## 2022-07-18 DIAGNOSIS — I5022 Chronic systolic (congestive) heart failure: Secondary | ICD-10-CM | POA: Diagnosis not present

## 2022-07-18 DIAGNOSIS — R1312 Dysphagia, oropharyngeal phase: Secondary | ICD-10-CM | POA: Diagnosis not present

## 2022-07-18 DIAGNOSIS — R41841 Cognitive communication deficit: Secondary | ICD-10-CM | POA: Diagnosis not present

## 2022-07-18 DIAGNOSIS — I69354 Hemiplegia and hemiparesis following cerebral infarction affecting left non-dominant side: Secondary | ICD-10-CM | POA: Diagnosis not present

## 2022-07-19 DIAGNOSIS — E871 Hypo-osmolality and hyponatremia: Secondary | ICD-10-CM | POA: Diagnosis not present

## 2022-07-20 DIAGNOSIS — R1312 Dysphagia, oropharyngeal phase: Secondary | ICD-10-CM | POA: Diagnosis not present

## 2022-07-20 DIAGNOSIS — R41841 Cognitive communication deficit: Secondary | ICD-10-CM | POA: Diagnosis not present

## 2022-07-20 DIAGNOSIS — I5022 Chronic systolic (congestive) heart failure: Secondary | ICD-10-CM | POA: Diagnosis not present

## 2022-07-20 DIAGNOSIS — G4089 Other seizures: Secondary | ICD-10-CM | POA: Diagnosis not present

## 2022-07-20 DIAGNOSIS — I69354 Hemiplegia and hemiparesis following cerebral infarction affecting left non-dominant side: Secondary | ICD-10-CM | POA: Diagnosis not present

## 2022-07-20 DIAGNOSIS — S065X3S Traumatic subdural hemorrhage with loss of consciousness of 1 hour to 5 hours 59 minutes, sequela: Secondary | ICD-10-CM | POA: Diagnosis not present

## 2022-07-21 ENCOUNTER — Other Ambulatory Visit: Payer: Self-pay | Admitting: Adult Health

## 2022-07-21 MED ORDER — METHYLPHENIDATE HCL 5 MG PO TABS: 5.0000 mg | ORAL_TABLET | Freq: Two times a day (BID) | ORAL | 0 refills | Status: DC

## 2022-07-22 ENCOUNTER — Telehealth: Payer: Self-pay | Admitting: Neurology

## 2022-07-22 DIAGNOSIS — G4089 Other seizures: Secondary | ICD-10-CM | POA: Diagnosis not present

## 2022-07-22 DIAGNOSIS — R41841 Cognitive communication deficit: Secondary | ICD-10-CM | POA: Diagnosis not present

## 2022-07-22 DIAGNOSIS — I69354 Hemiplegia and hemiparesis following cerebral infarction affecting left non-dominant side: Secondary | ICD-10-CM | POA: Diagnosis not present

## 2022-07-22 DIAGNOSIS — I5022 Chronic systolic (congestive) heart failure: Secondary | ICD-10-CM | POA: Diagnosis not present

## 2022-07-22 DIAGNOSIS — R1312 Dysphagia, oropharyngeal phase: Secondary | ICD-10-CM | POA: Diagnosis not present

## 2022-07-22 DIAGNOSIS — S065X3S Traumatic subdural hemorrhage with loss of consciousness of 1 hour to 5 hours 59 minutes, sequela: Secondary | ICD-10-CM | POA: Diagnosis not present

## 2022-07-22 NOTE — Telephone Encounter (Signed)
Regina nurse from [employer]. Stated pt has a seziure that last for three and half minutes, there where more jerking than normal. She wants to know if there should be any therapy changes.

## 2022-07-24 DIAGNOSIS — I5022 Chronic systolic (congestive) heart failure: Secondary | ICD-10-CM | POA: Diagnosis not present

## 2022-07-24 DIAGNOSIS — S065X3S Traumatic subdural hemorrhage with loss of consciousness of 1 hour to 5 hours 59 minutes, sequela: Secondary | ICD-10-CM | POA: Diagnosis not present

## 2022-07-24 DIAGNOSIS — R1312 Dysphagia, oropharyngeal phase: Secondary | ICD-10-CM | POA: Diagnosis not present

## 2022-07-24 DIAGNOSIS — G4089 Other seizures: Secondary | ICD-10-CM | POA: Diagnosis not present

## 2022-07-24 DIAGNOSIS — R41841 Cognitive communication deficit: Secondary | ICD-10-CM | POA: Diagnosis not present

## 2022-07-24 DIAGNOSIS — I69354 Hemiplegia and hemiparesis following cerebral infarction affecting left non-dominant side: Secondary | ICD-10-CM | POA: Diagnosis not present

## 2022-07-25 DIAGNOSIS — R41841 Cognitive communication deficit: Secondary | ICD-10-CM | POA: Diagnosis not present

## 2022-07-25 DIAGNOSIS — S065X3S Traumatic subdural hemorrhage with loss of consciousness of 1 hour to 5 hours 59 minutes, sequela: Secondary | ICD-10-CM | POA: Diagnosis not present

## 2022-07-25 DIAGNOSIS — G4089 Other seizures: Secondary | ICD-10-CM | POA: Diagnosis not present

## 2022-07-25 DIAGNOSIS — I69354 Hemiplegia and hemiparesis following cerebral infarction affecting left non-dominant side: Secondary | ICD-10-CM | POA: Diagnosis not present

## 2022-07-25 DIAGNOSIS — I5022 Chronic systolic (congestive) heart failure: Secondary | ICD-10-CM | POA: Diagnosis not present

## 2022-07-25 DIAGNOSIS — R1312 Dysphagia, oropharyngeal phase: Secondary | ICD-10-CM | POA: Diagnosis not present

## 2022-07-25 MED ORDER — BRIVIACT 50 MG/5ML IV SOLN: 50.0000 mg | Freq: Two times a day (BID) | INTRAVENOUS | 0 refills | Status: DC

## 2022-07-25 MED ORDER — BRIVARACETAM 10 MG/ML PO SOLN: 50.0000 mg | Freq: Two times a day (BID) | ORAL | 5 refills | Status: DC

## 2022-07-25 NOTE — Telephone Encounter (Signed)
Called and another nurse answered. She is unaware of the patient's health concerns. She provided a number to the manager, [phone]. I will attempt reaching to the family first and if no answer will contact the [facility] manager

## 2022-07-25 NOTE — Addendum Note (Signed)
Addended by: Judi Cong on: 07/25/2022 05:36 PM   Modules accepted: Orders

## 2022-07-25 NOTE — Telephone Encounter (Signed)
Spoke with Dr Vickey Huger and she would like to add Briviact 50 mg solution BID. Will place the order and send to [facility] for them to add for the patient. I have forwarded this on the paper referral to [facility] and then I send this also to the pharmacy

## 2022-07-26 DIAGNOSIS — I5033 Acute on chronic diastolic (congestive) heart failure: Secondary | ICD-10-CM | POA: Diagnosis not present

## 2022-07-26 MED ORDER — BRIVARACETAM 10 MG/ML PO SOLN: 50.0000 mg | Freq: Two times a day (BID) | ORAL | 5 refills | Status: DC

## 2022-07-26 NOTE — Addendum Note (Signed)
Addended by: Geronimo Running A on: 07/26/2022 08:21 AM   Modules accepted: Orders

## 2022-07-26 NOTE — Telephone Encounter (Signed)
Received notice from [facility] that patient does not have a feeding tube. RX for Briavact needs to be clarified to say PO rather than per tube.  Received VO from Dr. Vickey Huger for this.  RX printed, waiting for Dr. Oliva Bustard signature.

## 2022-07-26 NOTE — Telephone Encounter (Signed)
I received a call from [employer], Charity fundraiser from [employer].  They would like to confirm with Dr. Vickey Huger that patient should continue Vimpat at the current schedule.  He is taking 50 mg of Vimpat in the morning and 125 mg in the evening.  The Briviact should be 50 mg twice daily p.o.  Tammy reports that patient's wife is very concerned about his medication schedule at this time.  I will speak with Dr. Vickey Huger and call Tammy back at [phone].

## 2022-07-26 NOTE — Telephone Encounter (Signed)
I spoke with Dr. Vickey Huger.  Patient should continue Briviact 50 mg twice daily p.o.  Patient should also continue Vimpat 50 mg in the morning and 125 mg in the evening for at least 2 weeks.  If after 2 weeks the Vimpat is making him too drowsy patient can reduce the Vimpat to 50 mg in the morning and 100 mg in the evening.  I called Tammy, RN at [facility].  I advised her of this.  He will let us know if further questions or concerns.

## 2022-07-26 NOTE — Telephone Encounter (Signed)
Pt wife called. Requesting a call back from nurse.

## 2022-07-26 NOTE — Telephone Encounter (Signed)
I called patient's wife, [name] [name], per DPR.  I had an extended conversation with her lasting greater than 11 minutes.   Patient's wife reports that patient is currently quite drowsy and "vacant" on the current dosage of Vimpat 50 mg in the morning and 125 mg in the afternoon.  She is agreeable to adding on the Briviact but would like to hold it until Monday.  Patient's has children coming to visit this weekend and she would like the patient to be alert and oriented enough to interact with the children, in case the Briviact is also sedating.    Patient's wife reports that patient has been averaging 1 seizure per month for the past 3 months.  Patient's wife reports they are walking a fine line between patient being too drowsy or his seizure frequency increasing.  I advised her that I would let Dr. Vickey Huger know of this. She has many questions about patient's medications and would appreciate a call from Dr. Vickey Huger if possible. [name] [name] can be reached at [phone].

## 2022-07-27 ENCOUNTER — Encounter: Payer: Medicare Other | Attending: Physical Medicine & Rehabilitation | Admitting: Physical Medicine & Rehabilitation

## 2022-07-27 ENCOUNTER — Telehealth: Payer: Self-pay | Admitting: Neurology

## 2022-07-27 ENCOUNTER — Encounter: Payer: Self-pay | Admitting: Physical Medicine & Rehabilitation

## 2022-07-27 VITALS — BP 132/85 | HR 74 | Ht 67.0 in

## 2022-07-27 DIAGNOSIS — S065X3S Traumatic subdural hemorrhage with loss of consciousness of 1 hour to 5 hours 59 minutes, sequela: Secondary | ICD-10-CM | POA: Diagnosis not present

## 2022-07-27 DIAGNOSIS — G8114 Spastic hemiplegia affecting left nondominant side: Secondary | ICD-10-CM | POA: Diagnosis not present

## 2022-07-27 DIAGNOSIS — I5022 Chronic systolic (congestive) heart failure: Secondary | ICD-10-CM | POA: Diagnosis not present

## 2022-07-27 DIAGNOSIS — G4089 Other seizures: Secondary | ICD-10-CM | POA: Diagnosis not present

## 2022-07-27 DIAGNOSIS — R41841 Cognitive communication deficit: Secondary | ICD-10-CM | POA: Diagnosis not present

## 2022-07-27 DIAGNOSIS — I69354 Hemiplegia and hemiparesis following cerebral infarction affecting left non-dominant side: Secondary | ICD-10-CM | POA: Diagnosis not present

## 2022-07-27 DIAGNOSIS — R1312 Dysphagia, oropharyngeal phase: Secondary | ICD-10-CM | POA: Diagnosis not present

## 2022-07-27 MED ORDER — ONABOTULINUMTOXINA 100 UNITS IJ SOLR
400.0000 [IU] | Freq: Once | INTRAMUSCULAR | Status: AC
Start: 2022-07-27 — End: 2022-07-27
  Administered 2022-07-27: 400 [IU] via INTRAMUSCULAR

## 2022-07-27 NOTE — Patient Instructions (Signed)
ALWAYS FEEL FREE TO CALL OUR OFFICE WITH ANY PROBLEMS OR QUESTIONS ([phone])  **PLEASE NOTE** ALL MEDICATION REFILL REQUESTS (INCLUDING CONTROLLED SUBSTANCES) NEED TO BE MADE AT LEAST 7 DAYS PRIOR TO REFILL BEING DUE. ANY REFILL REQUESTS INSIDE THAT TIME FRAME MAY RESULT IN DELAYS IN RECEIVING YOUR PRESCRIPTION.                    

## 2022-07-27 NOTE — Telephone Encounter (Signed)
Pt's wife is asking if pt can start the brivaracetam (BRIVIACT) 10 MG/ML suspension on Monday 5-27.  If Dr Vickey Huger is ok with that, wife says a new order will need to go to [facility] and a new Rx will need to be sent to [facility] [employer] Phone: [phone]. Wife asking to be called on [phone] if this can be done

## 2022-07-27 NOTE — Progress Notes (Signed)
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 This includes bleeding, bruising, infection, excessive weakness, or medication side effects. A REMS form is on file and signed.  Needle: 50mm injectable monopolar needle electrode    Number of units per muscle Pectoralis Major 0 units Pectoralis Minor 0 units Biceps 100 units Brachioradialis 0 units FCR 50 units FCU 50 units FDS 100 units FDP 100 units FPL 0 units Palmaris Longus 0 units Pronator Teres 0 units Pronator Quadratus 0 units Lumbricals 0 units All injections were done after obtaining appropriate EMG activity and after negative drawback for blood. The patient tolerated the procedure well. Post procedure instructions were given. Return in about 3 months (around 10/27/2022) for for clinical reassessment.

## 2022-07-28 NOTE — Telephone Encounter (Signed)
Called pt wife. Informed her of message nurse [facility] sent. She said thanks for calling and letting me know.

## 2022-07-29 DIAGNOSIS — I69354 Hemiplegia and hemiparesis following cerebral infarction affecting left non-dominant side: Secondary | ICD-10-CM | POA: Diagnosis not present

## 2022-07-29 DIAGNOSIS — S065X3S Traumatic subdural hemorrhage with loss of consciousness of 1 hour to 5 hours 59 minutes, sequela: Secondary | ICD-10-CM | POA: Diagnosis not present

## 2022-07-29 DIAGNOSIS — G4089 Other seizures: Secondary | ICD-10-CM | POA: Diagnosis not present

## 2022-07-29 DIAGNOSIS — I5022 Chronic systolic (congestive) heart failure: Secondary | ICD-10-CM | POA: Diagnosis not present

## 2022-07-29 DIAGNOSIS — R41841 Cognitive communication deficit: Secondary | ICD-10-CM | POA: Diagnosis not present

## 2022-07-29 DIAGNOSIS — R1312 Dysphagia, oropharyngeal phase: Secondary | ICD-10-CM | POA: Diagnosis not present

## 2022-07-29 NOTE — Telephone Encounter (Signed)
Rene Kocher called  from [facility]. Stated she needs a order to start medication BRIVIACT) on Monday.

## 2022-08-01 DIAGNOSIS — I69354 Hemiplegia and hemiparesis following cerebral infarction affecting left non-dominant side: Secondary | ICD-10-CM | POA: Diagnosis not present

## 2022-08-01 DIAGNOSIS — R1312 Dysphagia, oropharyngeal phase: Secondary | ICD-10-CM | POA: Diagnosis not present

## 2022-08-01 DIAGNOSIS — I5022 Chronic systolic (congestive) heart failure: Secondary | ICD-10-CM | POA: Diagnosis not present

## 2022-08-01 DIAGNOSIS — G4089 Other seizures: Secondary | ICD-10-CM | POA: Diagnosis not present

## 2022-08-01 DIAGNOSIS — R41841 Cognitive communication deficit: Secondary | ICD-10-CM | POA: Diagnosis not present

## 2022-08-01 DIAGNOSIS — S065X3S Traumatic subdural hemorrhage with loss of consciousness of 1 hour to 5 hours 59 minutes, sequela: Secondary | ICD-10-CM | POA: Diagnosis not present

## 2022-08-02 DIAGNOSIS — E7220 Disorder of urea cycle metabolism, unspecified: Secondary | ICD-10-CM | POA: Diagnosis not present

## 2022-08-02 LAB — BASIC METABOLIC PANEL
BUN: 30 — AB (ref 4–21)
CO2: 23 — AB (ref 13–22)
Chloride: 108 (ref 99–108)
Creatinine: 1.1 (ref 0.6–1.3)
Glucose: 96
Potassium: 3.8 mEq/L (ref 3.5–5.1)
Sodium: 145 (ref 137–147)

## 2022-08-02 LAB — COMPREHENSIVE METABOLIC PANEL
Calcium: 9.1 (ref 8.7–10.7)
eGFR: 65

## 2022-08-02 NOTE — Telephone Encounter (Signed)
I spoke with Rene Kocher at [facility]. She is requesting the order to be faxed to [phone]. Order printed and faxed.

## 2022-08-03 DIAGNOSIS — S065X3S Traumatic subdural hemorrhage with loss of consciousness of 1 hour to 5 hours 59 minutes, sequela: Secondary | ICD-10-CM | POA: Diagnosis not present

## 2022-08-03 DIAGNOSIS — R1312 Dysphagia, oropharyngeal phase: Secondary | ICD-10-CM | POA: Diagnosis not present

## 2022-08-03 DIAGNOSIS — I5022 Chronic systolic (congestive) heart failure: Secondary | ICD-10-CM | POA: Diagnosis not present

## 2022-08-03 DIAGNOSIS — G4089 Other seizures: Secondary | ICD-10-CM | POA: Diagnosis not present

## 2022-08-03 DIAGNOSIS — R41841 Cognitive communication deficit: Secondary | ICD-10-CM | POA: Diagnosis not present

## 2022-08-03 DIAGNOSIS — I69354 Hemiplegia and hemiparesis following cerebral infarction affecting left non-dominant side: Secondary | ICD-10-CM | POA: Diagnosis not present

## 2022-08-05 DIAGNOSIS — G4089 Other seizures: Secondary | ICD-10-CM | POA: Diagnosis not present

## 2022-08-05 DIAGNOSIS — I69354 Hemiplegia and hemiparesis following cerebral infarction affecting left non-dominant side: Secondary | ICD-10-CM | POA: Diagnosis not present

## 2022-08-05 DIAGNOSIS — R41841 Cognitive communication deficit: Secondary | ICD-10-CM | POA: Diagnosis not present

## 2022-08-05 DIAGNOSIS — S065X3S Traumatic subdural hemorrhage with loss of consciousness of 1 hour to 5 hours 59 minutes, sequela: Secondary | ICD-10-CM | POA: Diagnosis not present

## 2022-08-05 DIAGNOSIS — R1312 Dysphagia, oropharyngeal phase: Secondary | ICD-10-CM | POA: Diagnosis not present

## 2022-08-05 DIAGNOSIS — I5022 Chronic systolic (congestive) heart failure: Secondary | ICD-10-CM | POA: Diagnosis not present

## 2022-08-06 DIAGNOSIS — I69812 Visuospatial deficit and spatial neglect following other cerebrovascular disease: Secondary | ICD-10-CM | POA: Diagnosis not present

## 2022-08-06 DIAGNOSIS — R41840 Attention and concentration deficit: Secondary | ICD-10-CM | POA: Diagnosis not present

## 2022-08-06 DIAGNOSIS — R293 Abnormal posture: Secondary | ICD-10-CM | POA: Diagnosis not present

## 2022-08-06 DIAGNOSIS — I5022 Chronic systolic (congestive) heart failure: Secondary | ICD-10-CM | POA: Diagnosis not present

## 2022-08-06 DIAGNOSIS — R41841 Cognitive communication deficit: Secondary | ICD-10-CM | POA: Diagnosis not present

## 2022-08-06 DIAGNOSIS — R1312 Dysphagia, oropharyngeal phase: Secondary | ICD-10-CM | POA: Diagnosis not present

## 2022-08-06 DIAGNOSIS — S065X3S Traumatic subdural hemorrhage with loss of consciousness of 1 hour to 5 hours 59 minutes, sequela: Secondary | ICD-10-CM | POA: Diagnosis not present

## 2022-08-06 DIAGNOSIS — G4089 Other seizures: Secondary | ICD-10-CM | POA: Diagnosis not present

## 2022-08-06 DIAGNOSIS — I69354 Hemiplegia and hemiparesis following cerebral infarction affecting left non-dominant side: Secondary | ICD-10-CM | POA: Diagnosis not present

## 2022-08-06 DIAGNOSIS — H538 Other visual disturbances: Secondary | ICD-10-CM | POA: Diagnosis not present

## 2022-08-06 DIAGNOSIS — M6389 Disorders of muscle in diseases classified elsewhere, multiple sites: Secondary | ICD-10-CM | POA: Diagnosis not present

## 2022-08-06 DIAGNOSIS — R278 Other lack of coordination: Secondary | ICD-10-CM | POA: Diagnosis not present

## 2022-08-08 DIAGNOSIS — I69354 Hemiplegia and hemiparesis following cerebral infarction affecting left non-dominant side: Secondary | ICD-10-CM | POA: Diagnosis not present

## 2022-08-08 DIAGNOSIS — I5022 Chronic systolic (congestive) heart failure: Secondary | ICD-10-CM | POA: Diagnosis not present

## 2022-08-08 DIAGNOSIS — R1312 Dysphagia, oropharyngeal phase: Secondary | ICD-10-CM | POA: Diagnosis not present

## 2022-08-08 DIAGNOSIS — R41841 Cognitive communication deficit: Secondary | ICD-10-CM | POA: Diagnosis not present

## 2022-08-08 DIAGNOSIS — S065X3S Traumatic subdural hemorrhage with loss of consciousness of 1 hour to 5 hours 59 minutes, sequela: Secondary | ICD-10-CM | POA: Diagnosis not present

## 2022-08-08 DIAGNOSIS — G4089 Other seizures: Secondary | ICD-10-CM | POA: Diagnosis not present

## 2022-08-09 DIAGNOSIS — I5033 Acute on chronic diastolic (congestive) heart failure: Secondary | ICD-10-CM | POA: Diagnosis not present

## 2022-08-10 ENCOUNTER — Ambulatory Visit (INDEPENDENT_AMBULATORY_CARE_PROVIDER_SITE_OTHER): Payer: Medicare Other | Admitting: Internal Medicine

## 2022-08-10 ENCOUNTER — Other Ambulatory Visit: Payer: Self-pay

## 2022-08-10 DIAGNOSIS — I69354 Hemiplegia and hemiparesis following cerebral infarction affecting left non-dominant side: Secondary | ICD-10-CM | POA: Diagnosis not present

## 2022-08-10 DIAGNOSIS — R41841 Cognitive communication deficit: Secondary | ICD-10-CM | POA: Diagnosis not present

## 2022-08-10 DIAGNOSIS — S065X3S Traumatic subdural hemorrhage with loss of consciousness of 1 hour to 5 hours 59 minutes, sequela: Secondary | ICD-10-CM | POA: Diagnosis not present

## 2022-08-10 DIAGNOSIS — I5022 Chronic systolic (congestive) heart failure: Secondary | ICD-10-CM | POA: Diagnosis not present

## 2022-08-10 DIAGNOSIS — I339 Acute and subacute endocarditis, unspecified: Secondary | ICD-10-CM

## 2022-08-10 DIAGNOSIS — R1312 Dysphagia, oropharyngeal phase: Secondary | ICD-10-CM | POA: Diagnosis not present

## 2022-08-10 DIAGNOSIS — G4089 Other seizures: Secondary | ICD-10-CM | POA: Diagnosis not present

## 2022-08-10 LAB — CBC WITH DIFFERENTIAL/PLATELET
HCT: 37.1 % — ABNORMAL LOW (ref 38.5–50.0)
MCH: 30.6 pg (ref 27.0–33.0)
MCV: 93 fL (ref 80.0–100.0)

## 2022-08-10 NOTE — Progress Notes (Addendum)
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) 07/14/2021   Sleep disorder 06/02/2021   Prolonged Q-T interval on ECG 04/30/2021   Secondary hypercoagulable state (HCC) 02/01/2021   Tremor of unknown origin 01/18/2021   Prosthetic valve endocarditis (HCC)    Protein-calorie malnutrition, severe 12/18/2020   S/P MVR (mitral valve repair)    History of CVA in adulthood    History of aortic valve replacement    History of mitral valve replacement    Pressure injury of skin 12/14/2020   Hypernatremia 12/14/2020   Somnolence    Diabetes mellitus type II, controlled (HCC) 10/15/2020   Oropharyngeal dysphagia    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)    Acute ischemic right MCA stroke (HCC) 08/15/2020   CVA (cerebral vascular accident) (HCC) 08/07/2020   Middle cerebral artery embolism, 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   Subdural hemorrhage following injury (HCC) 07/31/2020   S/P craniotomy 07/28/2020   Subdural hematoma (HCC) 07/27/2020   Vertigo 08/30/2018   S/P right THA, 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   History of food allergy 12/11/2017   Emesis 12/11/2017   Visit for monitoring Tikosyn therapy 06/06/2017   Internal hemorrhoid, bleeding 05/19/2016   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   Aortic valve disorder 07/09/2015   Long term current use of anticoagulant therapy 01/19/2015   Atrial fibrillation, chronic (HCC) 01/20/2014   Elevated PSA 12/23/2013   Prediabetes 12/19/2013   Benign prostatic hyperplasia with urinary obstruction 10/29/2013   left frontal cerebral  infarct secondary to afib 08/05/2013   Atrial flutter (HCC) 05/17/2013   Essential hypertension 06/19/2012   Routine health maintenance 04/26/2011   Persistent atrial fibrillation (HCC) 07/10/2008   Hyperlipidemia with target LDL less than 130 07/07/2008   History of colonic polyps 06/16/2008   ERECTILE DYSFUNCTION 09/25/2007   PAF (paroxysmal atrial fibrillation) (HCC) 09/24/2007    Patient's Medications  New Prescriptions   No medications on file  Previous Medications   ACETAMINOPHEN (TYLENOL) 325 MG TABLET    Take 650 mg by mouth as needed for mild pain.   AMANTADINE HCL 100 MG TABLET    Take 100 mg by mouth 2 (two) times daily.   AMOXICILLIN (AMOXIL) 500 MG CAPSULE    Take 500 mg by mouth 2 (two) times daily.   APIXABAN (ELIQUIS) 5 MG TABS TABLET    Take 1 tablet (5 mg total) by mouth 2 (two) times daily.   ARTIFICIAL SALIVA (BIOTENE MOISTURIZING MOUTH MT)    Use as directed 2 sprays in the mouth or throat in the morning, at noon, and at bedtime.   ATORVASTATIN (LIPITOR) 80 MG TABLET    Take 1 tablet (80 mg total) by mouth daily.   BISACODYL (DULCOLAX) 10 MG SUPPOSITORY    Place 1 suppository (10 mg total) rectally daily as needed for moderate constipation or mild constipation.   BRIVARACETAM (BRIVIACT) 10 MG/ML SUSPENSION    Take 5 mLs (50 mg total) by mouth 2 (two) times daily.   CLOBETASOL (TEMOVATE) 0.05 % EXTERNAL SOLUTION    Apply 1 application  topically as needed (itching).   DIAZEPAM, 15 MG DOSE, (VALTOCO 15 MG DOSE) 2 X 7.5 MG/0.1ML LQPK    Place 7.5 mg into each nostril (total of 15 mg) for grand mal seizure lasting over 5 minutes. Can repeat once in 4 hours. Do Not use more than twice in 24 hours   DOFETILIDE (TIKOSYN) 125 MCG CAPSULE    Take 3 capsules (375 mcg total) by mouth 2 (two) times daily.   FAMOTIDINE (PEPCID) 20 MG TABLET    Take 20 mg by mouth at bedtime as needed for indigestion.   FINASTERIDE (PROSCAR) 5 MG TABLET    Take 1 tablet (5 mg total) by mouth daily.    GLUCERNA (GLUCERNA) LIQD    Take 237 mLs by mouth daily as needed.   HYDROCORTISONE 2.5 % CREAM    Apply 1 application. topically daily as needed (itching).   LACOSAMIDE (VIMPAT) 10 MG/ML ORAL SOLUTION    Take 125 mg by mouth at bedtime. 50mg  in AM and 125mg  in PM per Dr. Vickey Huger 07/26/22   MAGNESIUM GLUCONATE (MAGONATE) 500 MG TABLET    Take 1 tablet (500 mg total) by mouth daily.   MELATONIN 5 MG TABS    Take 5 mg by mouth at bedtime.   METHENAMINE (MANDELAMINE) 1 G TABLET    Take 1,000 mg by mouth 2 (two) times daily.   METHYLPHENIDATE (RITALIN) 5 MG TABLET    Take 1 tablet (5 mg total) by mouth 2 (two) times daily. Take at 7 am and 12 pm   MULTIPLE VITAMIN (MULTIVITAMIN WITH MINERALS) TABS TABLET    Take 1 tablet by mouth daily.   NEBULIZERS MISC    by Does not apply route as needed.   OMEPRAZOLE (PRILOSEC) 20 MG CAPSULE    Take 20 mg by mouth daily.   OXYGEN    Inhale into the lungs as directed. Oxygen at 2L [place] - titrate for sat > or = to 90%; May leave on for SOB Every Shift - PRN; PRN 1, PRN 2, PRN 3   POLYETHYL GLYCOL-PROPYL GLYCOL (SYSTANE) 0.4-0.3 % SOLN    Place 2 drops into both eyes daily.   POLYETHYLENE GLYCOL (MIRALAX / GLYCOLAX) 17 G PACKET    Take 17 g by mouth daily.   POTASSIUM CHLORIDE (KLOR-CON M) 10 MEQ TABLET    Take 20 mEq by mouth daily.   SENNA-DOCUSATE (SENOKOT-S) 8.6-50 MG TABLET    Take 1 tablet by mouth at bedtime as needed.   TORSEMIDE (DEMADEX) 20 MG TABLET    Take 40 mg by mouth daily. 7:30 am -10 pm  Special Instructions: Extra 20 mg of Torsemide Tuesday and Thursday each week Total of 60 mg Once A Day on Tue, Thu; 08:00 AM - 10:00 AM   UNABLE TO FIND    BiPAP  Apply BiPAP @ [facility], Make sure no water or moisture in hose (use spare hose if necessary) Reapply if taken off. Special Instructions: Check hose for moisture/water, if present use spare hose [DX: Obstructive sleep apnea (adult) (pediatric)] At Bedtime; 09:00 PM - 10:00 PM  Modified Medications    No medications on file  Discontinued Medications   No medications on file    Subjective: [name] [name] is a 82 y.o. 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 04/19/19 he presents with his wife. Pt lives at [facility]. Wife reports that he is able to tolerate the amoxicillin. She believes if pt's PO intake does not improve, peg tube may be the next step. Interim: Seen by [facility] on 1/25, noted to have vegetation still present on mitral valve. Blood Cx NG on 1/25. No plan to change management. 05/11/21: Wife states pt is still not interacting very much. Other wise no other complaints.   Interim: Seen by Dr. Shirlee Latch Cardiology on 4/25. Plan to follow-up in 6 months 07/12/21: Wife present at visit. She reports pt is more sleepy than usual. He has a sleep study scheduled.    02/02/22: Presents with wife. More interactive today.  Today 08/10/22: Taking anti-seizure meds. Tolerating amoxicillin. Presents with wife. No new complaints.  Review of Systems: Review of Systems  All other systems reviewed and are negative.   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    Other and unspecified hyperlipidemia    Pre-diabetes     Previous back surgery 1978, jan 2007   Psychosexual dysfunction with inhibited sexual excitement    SOB (shortness of breath)    "with heavy exercise"   Stroke (HCC) 08/2013   "I WAS IN AFIB AND THREW A CLOT, THE EFFECTS WERE TRANSITORY AND DIDNT LAST BUT FOR 30 MINUTES"    Thoracic aortic aneurysm (HCC)     Social History   Tobacco Use   Smoking status: Never   Smokeless tobacco: Never  Vaping Use   Vaping Use: Never used  Substance Use Topics   Alcohol use: Never    Comment: quit 5/22   Drug use: Never    Family History  Problem Relation Age of Onset   Macular degeneration Mother        macular degeneration   Cancer Father        intestinal/GI   Diabetes Paternal Aunt    Heart attack Maternal Grandfather    Diabetes Brother     Allergies  Allergen Reactions   Naproxen Hives   No [facility] Food Allergies Other (See Comments)    Scallops - distress, nausea and vomitting    Health Maintenance  Topic Date Due   Medicare Annual Wellness (AWV)  07/10/2020   COVID-19 Vaccine (4 - 2023-24 season) 11/05/2021   OPHTHALMOLOGY EXAM  10/06/2022 (Originally 10/04/1950)   Diabetic kidney evaluation - Urine ACR  07/01/2027 (Originally 10/04/1958)   INFLUENZA VACCINE  10/06/2022   FOOT EXAM  10/23/2022   HEMOGLOBIN A1C  01/04/2023   Diabetic kidney evaluation - eGFR measurement  08/02/2023   DTaP/Tdap/Td (4 - Td or Tdap) 03/22/2028   Pneumonia Vaccine 85+ Years old  Completed   Zoster Vaccines- Shingrix  Completed   HPV VACCINES  Aged Out   Colonoscopy  Discontinued   Hepatitis C Screening  Discontinued  Objective:  There were no vitals filed for this visit. There is no height or weight on file to calculate BMI.  Physical Exam Constitutional:      General: He is not in acute distress.    Appearance: He is normal weight. He is not toxic-appearing.  HENT:     Head: Normocephalic and atraumatic.     Right Ear: External ear normal.     Left Ear: External ear  normal.     Nose: No congestion or rhinorrhea.     Mouth/Throat:     Mouth: Mucous membranes are moist.     Pharynx: Oropharynx is clear.  Eyes:     Extraocular Movements: Extraocular movements intact.     Conjunctiva/sclera: Conjunctivae normal.     Pupils: Pupils are equal, round, and reactive to light.  Cardiovascular:     Rate and Rhythm: Normal rate and regular rhythm.     Heart sounds: No murmur heard.    No friction rub. No gallop.  Pulmonary:     Effort: Pulmonary effort is normal.     Breath sounds: Normal breath sounds.  Abdominal:     General: Abdomen is flat. Bowel sounds are normal.     Palpations: Abdomen is soft.  Musculoskeletal:        General: No swelling.     Cervical back: Normal range of motion and neck supple.  Skin:    General: Skin is warm and dry.  Neurological:     General: No focal deficit present.     Mental Status: He is oriented to person, place, and time.  Psychiatric:        Mood and Affect: Mood normal.     Lab Results Lab Results  Component Value Date   WBC 8.1 03/14/2022   HGB 10.3 (A) 03/14/2022   HCT 32 (A) 03/14/2022   MCV 86.0 02/02/2022   PLT 240 03/14/2022    Lab Results  Component Value Date   CREATININE 1.1 08/02/2022   BUN 30 (A) 08/02/2022   NA 145 08/02/2022   K 3.8 08/02/2022   CL 108 08/02/2022   CO2 23 (A) 08/02/2022    Lab Results  Component Value Date   ALT 42 (A) 06/07/2022   AST 33 06/07/2022   ALKPHOS 107 06/07/2022   BILITOT 0.9 02/02/2022    Lab Results  Component Value Date   CHOL 98 06/14/2022   HDL 32 (A) 06/14/2022   LDLCALC 54 06/14/2022   TRIG 57 06/14/2022   CHOLHDL 2.8 08/08/2020   No results found for: "LABRPR", "RPRTITER" No results found for: "HIV1RNAQUANT", "HIV1RNAVL", "CD4TABS"   Problem List Items Addressed This Visit   None  Assessment/Plan #E 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 02/24/21, 03/31/21, 05/11/21 surveillance blood Cx negative. vegetation noted on TTE on 1/25. Vegetation likely represent sterile mass. -Seen by Cards Dr. Shirlee Latch on 10/30,noted that echo done on day of visit showed vegetation and thickening of MV  with thickening of AoV, no definite mobile vegetation, concern for indolent endocarditis. 10/30 BCx NG. Pt is a poor candidate for perc valve replacement.  Plan -labs today cbc, cmp, esr, crp (wife requested call with normal results as well).  -Continue amoxicillin 500mg  PO bid as labs have been stable and blood Cx negative.  -Follow-up in one year   Danelle Earthly, MD [facility] 08/10/2022, 8:54 AM  I have personally spent 22 minutes involved in face-to-face and non-face-to-face activities for this patient on the day of the visit. Professional time spent includes the following activities: Preparing to see the patient (review of tests), Obtaining and/or reviewing separately obtained history (admission/discharge record), Performing a medically appropriate examination and/or evaluation , Ordering medications/testsreferring and communicating with other health care professionals, Documenting clinical information in the EMR, Independently interpreting results (not separately reported), Communicating results to the patient/family/caregiver, Counseling and educating the patient/family/caregiver and Care coordination (not separately reported).

## 2022-08-11 LAB — CBC WITH DIFFERENTIAL/PLATELET
Absolute Monocytes: 558 cells/uL (ref 200–950)
Basophils Absolute: 72 cells/uL (ref 0–200)
Basophils Relative: 0.8 %
Eosinophils Absolute: 324 cells/uL (ref 15–500)
Eosinophils Relative: 3.6 %
Hemoglobin: 12.2 g/dL — ABNORMAL LOW (ref 13.2–17.1)
Lymphs Abs: 1287 cells/uL (ref 850–3900)
MCHC: 32.9 g/dL (ref 32.0–36.0)
MPV: 12.5 fL (ref 7.5–12.5)
Monocytes Relative: 6.2 %
Neutro Abs: 6759 cells/uL (ref 1500–7800)
Neutrophils Relative %: 75.1 %
Platelets: 220 10*3/uL (ref 140–400)
RBC: 3.99 10*6/uL — ABNORMAL LOW (ref 4.20–5.80)
RDW: 13.4 % (ref 11.0–15.0)
Total Lymphocyte: 14.3 %
WBC: 9 10*3/uL (ref 3.8–10.8)

## 2022-08-11 LAB — COMPLETE METABOLIC PANEL WITH GFR
AG Ratio: 1.4 (calc) (ref 1.0–2.5)
ALT: 54 U/L — ABNORMAL HIGH (ref 9–46)
AST: 34 U/L (ref 10–35)
Albumin: 4.2 g/dL (ref 3.6–5.1)
Alkaline phosphatase (APISO): 127 U/L (ref 35–144)
BUN/Creatinine Ratio: 28 (calc) — ABNORMAL HIGH (ref 6–22)
BUN: 31 mg/dL — ABNORMAL HIGH (ref 7–25)
CO2: 25 mmol/L (ref 20–32)
Calcium: 9.4 mg/dL (ref 8.6–10.3)
Chloride: 107 mmol/L (ref 98–110)
Creat: 1.1 mg/dL (ref 0.70–1.28)
Globulin: 3.1 g/dL (calc) (ref 1.9–3.7)
Glucose, Bld: 136 mg/dL — ABNORMAL HIGH (ref 65–99)
Potassium: 3.8 mmol/L (ref 3.5–5.3)
Sodium: 147 mmol/L — ABNORMAL HIGH (ref 135–146)
Total Bilirubin: 1 mg/dL (ref 0.2–1.2)
Total Protein: 7.3 g/dL (ref 6.1–8.1)
eGFR: 68 mL/min/{1.73_m2} (ref >=60)

## 2022-08-11 LAB — SEDIMENTATION RATE: Sed Rate: 17 mm/h (ref 0–20)

## 2022-08-11 LAB — C-REACTIVE PROTEIN: CRP: <3 mg/L (ref <8.0)

## 2022-08-12 DIAGNOSIS — G4089 Other seizures: Secondary | ICD-10-CM | POA: Diagnosis not present

## 2022-08-12 DIAGNOSIS — R41841 Cognitive communication deficit: Secondary | ICD-10-CM | POA: Diagnosis not present

## 2022-08-12 DIAGNOSIS — I5022 Chronic systolic (congestive) heart failure: Secondary | ICD-10-CM | POA: Diagnosis not present

## 2022-08-12 DIAGNOSIS — I69354 Hemiplegia and hemiparesis following cerebral infarction affecting left non-dominant side: Secondary | ICD-10-CM | POA: Diagnosis not present

## 2022-08-12 DIAGNOSIS — R1312 Dysphagia, oropharyngeal phase: Secondary | ICD-10-CM | POA: Diagnosis not present

## 2022-08-12 DIAGNOSIS — S065X3S Traumatic subdural hemorrhage with loss of consciousness of 1 hour to 5 hours 59 minutes, sequela: Secondary | ICD-10-CM | POA: Diagnosis not present

## 2022-08-14 DIAGNOSIS — R1312 Dysphagia, oropharyngeal phase: Secondary | ICD-10-CM | POA: Diagnosis not present

## 2022-08-14 DIAGNOSIS — R41841 Cognitive communication deficit: Secondary | ICD-10-CM | POA: Diagnosis not present

## 2022-08-14 DIAGNOSIS — G4089 Other seizures: Secondary | ICD-10-CM | POA: Diagnosis not present

## 2022-08-14 DIAGNOSIS — S065X3S Traumatic subdural hemorrhage with loss of consciousness of 1 hour to 5 hours 59 minutes, sequela: Secondary | ICD-10-CM | POA: Diagnosis not present

## 2022-08-14 DIAGNOSIS — I69354 Hemiplegia and hemiparesis following cerebral infarction affecting left non-dominant side: Secondary | ICD-10-CM | POA: Diagnosis not present

## 2022-08-14 DIAGNOSIS — I5022 Chronic systolic (congestive) heart failure: Secondary | ICD-10-CM | POA: Diagnosis not present

## 2022-08-15 DIAGNOSIS — R1312 Dysphagia, oropharyngeal phase: Secondary | ICD-10-CM | POA: Diagnosis not present

## 2022-08-15 DIAGNOSIS — S065X3S Traumatic subdural hemorrhage with loss of consciousness of 1 hour to 5 hours 59 minutes, sequela: Secondary | ICD-10-CM | POA: Diagnosis not present

## 2022-08-15 DIAGNOSIS — I5022 Chronic systolic (congestive) heart failure: Secondary | ICD-10-CM | POA: Diagnosis not present

## 2022-08-15 DIAGNOSIS — R41841 Cognitive communication deficit: Secondary | ICD-10-CM | POA: Diagnosis not present

## 2022-08-15 DIAGNOSIS — I69354 Hemiplegia and hemiparesis following cerebral infarction affecting left non-dominant side: Secondary | ICD-10-CM | POA: Diagnosis not present

## 2022-08-15 DIAGNOSIS — G4089 Other seizures: Secondary | ICD-10-CM | POA: Diagnosis not present

## 2022-08-16 DIAGNOSIS — R1312 Dysphagia, oropharyngeal phase: Secondary | ICD-10-CM | POA: Diagnosis not present

## 2022-08-16 DIAGNOSIS — S065X3S Traumatic subdural hemorrhage with loss of consciousness of 1 hour to 5 hours 59 minutes, sequela: Secondary | ICD-10-CM | POA: Diagnosis not present

## 2022-08-16 DIAGNOSIS — I5022 Chronic systolic (congestive) heart failure: Secondary | ICD-10-CM | POA: Diagnosis not present

## 2022-08-16 DIAGNOSIS — I69354 Hemiplegia and hemiparesis following cerebral infarction affecting left non-dominant side: Secondary | ICD-10-CM | POA: Diagnosis not present

## 2022-08-16 DIAGNOSIS — R41841 Cognitive communication deficit: Secondary | ICD-10-CM | POA: Diagnosis not present

## 2022-08-16 DIAGNOSIS — I5033 Acute on chronic diastolic (congestive) heart failure: Secondary | ICD-10-CM | POA: Diagnosis not present

## 2022-08-16 DIAGNOSIS — G4089 Other seizures: Secondary | ICD-10-CM | POA: Diagnosis not present

## 2022-08-17 DIAGNOSIS — I5022 Chronic systolic (congestive) heart failure: Secondary | ICD-10-CM | POA: Diagnosis not present

## 2022-08-17 DIAGNOSIS — S065X3S Traumatic subdural hemorrhage with loss of consciousness of 1 hour to 5 hours 59 minutes, sequela: Secondary | ICD-10-CM | POA: Diagnosis not present

## 2022-08-17 DIAGNOSIS — R41841 Cognitive communication deficit: Secondary | ICD-10-CM | POA: Diagnosis not present

## 2022-08-17 DIAGNOSIS — G4089 Other seizures: Secondary | ICD-10-CM | POA: Diagnosis not present

## 2022-08-17 DIAGNOSIS — R1312 Dysphagia, oropharyngeal phase: Secondary | ICD-10-CM | POA: Diagnosis not present

## 2022-08-17 DIAGNOSIS — I69354 Hemiplegia and hemiparesis following cerebral infarction affecting left non-dominant side: Secondary | ICD-10-CM | POA: Diagnosis not present

## 2022-08-19 DIAGNOSIS — R1312 Dysphagia, oropharyngeal phase: Secondary | ICD-10-CM | POA: Diagnosis not present

## 2022-08-19 DIAGNOSIS — I5022 Chronic systolic (congestive) heart failure: Secondary | ICD-10-CM | POA: Diagnosis not present

## 2022-08-19 DIAGNOSIS — I69354 Hemiplegia and hemiparesis following cerebral infarction affecting left non-dominant side: Secondary | ICD-10-CM | POA: Diagnosis not present

## 2022-08-19 DIAGNOSIS — R41841 Cognitive communication deficit: Secondary | ICD-10-CM | POA: Diagnosis not present

## 2022-08-19 DIAGNOSIS — G4089 Other seizures: Secondary | ICD-10-CM | POA: Diagnosis not present

## 2022-08-19 DIAGNOSIS — S065X3S Traumatic subdural hemorrhage with loss of consciousness of 1 hour to 5 hours 59 minutes, sequela: Secondary | ICD-10-CM | POA: Diagnosis not present

## 2022-08-22 ENCOUNTER — Other Ambulatory Visit: Payer: Self-pay | Admitting: Adult Health

## 2022-08-22 ENCOUNTER — Other Ambulatory Visit: Payer: Self-pay | Admitting: Internal Medicine

## 2022-08-22 DIAGNOSIS — G4089 Other seizures: Secondary | ICD-10-CM | POA: Diagnosis not present

## 2022-08-22 DIAGNOSIS — R41841 Cognitive communication deficit: Secondary | ICD-10-CM | POA: Diagnosis not present

## 2022-08-22 DIAGNOSIS — R1312 Dysphagia, oropharyngeal phase: Secondary | ICD-10-CM | POA: Diagnosis not present

## 2022-08-22 DIAGNOSIS — I5022 Chronic systolic (congestive) heart failure: Secondary | ICD-10-CM | POA: Diagnosis not present

## 2022-08-22 DIAGNOSIS — S065X3S Traumatic subdural hemorrhage with loss of consciousness of 1 hour to 5 hours 59 minutes, sequela: Secondary | ICD-10-CM | POA: Diagnosis not present

## 2022-08-22 DIAGNOSIS — I69354 Hemiplegia and hemiparesis following cerebral infarction affecting left non-dominant side: Secondary | ICD-10-CM | POA: Diagnosis not present

## 2022-08-22 MED ORDER — LACOSAMIDE 10 MG/ML PO SOLN: 125.0000 mg | Freq: Every day | ORAL | 0 refills | Status: DC

## 2022-08-22 MED ORDER — METHYLPHENIDATE HCL 5 MG PO TABS: 5.0000 mg | ORAL_TABLET | Freq: Two times a day (BID) | ORAL | 0 refills | Status: DC

## 2022-08-23 DIAGNOSIS — I5033 Acute on chronic diastolic (congestive) heart failure: Secondary | ICD-10-CM | POA: Diagnosis not present

## 2022-08-24 DIAGNOSIS — R41841 Cognitive communication deficit: Secondary | ICD-10-CM | POA: Diagnosis not present

## 2022-08-24 DIAGNOSIS — I5022 Chronic systolic (congestive) heart failure: Secondary | ICD-10-CM | POA: Diagnosis not present

## 2022-08-24 DIAGNOSIS — S065X3S Traumatic subdural hemorrhage with loss of consciousness of 1 hour to 5 hours 59 minutes, sequela: Secondary | ICD-10-CM | POA: Diagnosis not present

## 2022-08-24 DIAGNOSIS — I69354 Hemiplegia and hemiparesis following cerebral infarction affecting left non-dominant side: Secondary | ICD-10-CM | POA: Diagnosis not present

## 2022-08-24 DIAGNOSIS — R1312 Dysphagia, oropharyngeal phase: Secondary | ICD-10-CM | POA: Diagnosis not present

## 2022-08-24 DIAGNOSIS — G4089 Other seizures: Secondary | ICD-10-CM | POA: Diagnosis not present

## 2022-08-25 ENCOUNTER — Ambulatory Visit (INDEPENDENT_AMBULATORY_CARE_PROVIDER_SITE_OTHER): Payer: Medicare Other | Admitting: Neurology

## 2022-08-25 ENCOUNTER — Encounter: Payer: Self-pay | Admitting: Neurology

## 2022-08-25 ENCOUNTER — Telehealth: Payer: Self-pay | Admitting: Neurology

## 2022-08-25 VITALS — BP 111/66 | HR 58 | Ht 67.0 in

## 2022-08-25 DIAGNOSIS — R063 Periodic breathing: Secondary | ICD-10-CM

## 2022-08-25 DIAGNOSIS — I4819 Other persistent atrial fibrillation: Secondary | ICD-10-CM

## 2022-08-25 DIAGNOSIS — R419 Unspecified symptoms and signs involving cognitive functions and awareness: Secondary | ICD-10-CM

## 2022-08-25 NOTE — Telephone Encounter (Signed)
Do you recall which medication she was referring to?

## 2022-08-25 NOTE — Progress Notes (Signed)
Provider:  Melvyn Novas, MD  Primary Care Physician:  Mahlon Gammon, MD [address] [place] [address]     Referring Provider: Mahlon Gammon, Md [address] [place],  [place] [address]          Chief Complaint according to patient   Patient presents with:     New Patient (Initial Visit)           HISTORY OF PRESENT ILLNESS:  [name] [name] is a 82 y.o. male patient who is here for revisit 08/25/2022 for  seizure therapy in the setting of severe cognitive impairment. .  Chief concern according to patient's wife and son :  08-25-2022  [name] [name] had been started on Briviact and for 14 days with night and morning time dosing he became almost immediately sleepy.  He has remains a sleepy and spite of attempts to stimulate him etc. he is speaking very little and sometimes he seems to just stare off.  He has been speaking more softly and he has also developed a swallowing apraxia which could be related to hypersedation.  The medications he is on are now Vimpat and Briviact and both have sedating side effects.  He has not had seizures since he is on Briviact but he definitely has a large of neurocognitive negative side effect by suppression.  So the question here is should be taking him off Vimpat and leave him on Briviact only or should we just take him off Briviact and leave him on Vimpat alone he did have breakthrough seizures when he started on Vimpat.  I think he is definitely oversedated from IMH here today I like for the patient to stay on monotherapy if possible and we are discussing that Vimpat is in the many ways hide harder on his body then remaining on Briviact alone I would suggest to have the patient remain on the Briviact and slowly reduce Vimpat expecting a reduction in sedation and hopefully finding a dose where his seizure control is still excellent.  Seizures were noted on January 21 February 1 and 27, March 26, April 23 and May 16.   We decided to go  back to 50 mg Vimpat  and stay on Breviact, trying to wean off Vimpat if seizure control is stabile. I hope he will be able to recognize his guests on his 16s Birthday.   Pt wife states he only sleep a few hours. Pt wife states she believe that the CPAP has been helping. 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  Pt with wife and son, rm 2. Here for follow up and taking over as primary neurologist post his stroke and seizures. Family states that Dr Vickey [name] had agreed to take on but was after her return. Pt is set up with [facility] on auto bipap and he sometimes takes it off during the night. Still resides in [facility]          Other Wife has brought in a timeline of seizure events that [name] [name] has had.          HISTORY OF PRESENT ILLNESS:      04-26-2022:  [name] [name] is a 82 y.o. year old White or Caucasian male patient seen in RV on 04/26/2022 . His wife an son provide information, the patient is  unable to contribute much. He is wheelchair bound.  [name] [name] BiPAP out the download shows excellent compliance 88  out of 90 days and 91% of those days with over 4 hours of consecutive use, averaging 7 hours 50 minutes.  The residual AHI remains very high 27.5 events per hour 8.8 of these unknown and related likely to the high air leak.  Air leak at the 95th percentile was 964 L a minute.  Central apneas amounted to 15.2 and obstructive to only 3 apneas per hour among those residuals.  I today will change the fullface mask to a ResMed F10 that sits under the nose and hopefully gets a little bit better seal we still have the option of adding a chinstrap.  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  Interestingly the seizure frequency has not been reduced he has 1 or 2 a month and his very first seizure was in June of last year  I believe.  So in order to try to control the seizures I will order Vimpat at 50 mg in the morning and 75 mg at night will be increased now to 100 mg at night. His wife is an excellent record Ecologist of his seizures.  There was a 3 months period  between June and September were no seizure was witnessed - but then there were 3 in October 3 in November 1 in December and 1 in January.  He has more difficulties with swallowing which could be a side effect of higher sedation.  His left soft palate is lower than his right which fits the stroke residual findings.  He is overall less alert and less vocal.  Ritalin however seems to have helped this and on a day when he could not get his Ritalin dose he was significantly sleepy as noticed by his wife.  So this will be refilled.         I have the pleasure of meeting [name] [name] today in the presence of his wife and son, he had undergone his sleep study that showed a very complex form of apnea and finally was placed on and so called out to BiPAP the maximum inspiratory pressure was 20 cmH2O the minimum expiratory pressure is set at 5 and the pressure support is 5 cm.  This is a machine that can find its pressure each night and for this reason was chosen as the patient could not attend an in lab sleep study.  His BiPAP therapy shows a high number of central apneas to be still present 11.4/h there are very few obstructive apneas remain 2.4/h he does have a very high air leakage the 95th percentile air leak is 70 L a minute.  The total AHI was 24/h but 9.7 of these were called unknown apneas and I usually related to high air leakage.  The compliance of use is excellent the patient uses the machine every day and at least over 90% for 4 hours or more at night the average user time is 7 hours and 45 minutes.  My goal today is to reduce the air leakage by adding a chinstrap I will put a DME order for this in I also wanted to update his current medical history the patient  is followed by Dr. Caren Hazy as physical rehab specialist and he received Botox injections for the left upper extremity this has helped him to relax his finger flexion and he has actually shown  some left arm movements.  His degree of sleepiness is still severe but he was placed on Ritalin and the family feels that this has allowed him to concentrate longer being cognitively more focused and involved even in conversation now.  The decision to add Ritalin was blessed by Dr. Shirlee Latch his congestive heart failure specialist.  He had a hospital stay in June from June 5 for a seizure and another seizure occurred at home on the ninth 8 September this was milder he did not need to be hospitalized the repetitive involuntary movements were controlled after he was put back on oxygen.  During the summer he was 2.4 weeks 2.5 weeks on oxygen supplementation now weaned off the oxygen was given into the BiPAP.  He enjoys the attention of a very good physical therapist he has occupational therapy and he also has still speech therapy.   Mrs. Buonocore has asked me if we can further adjust his current apnea treatments and I think I would like to go first with a chinstrap and if this is not possible I would consider in a as the titration in the sleep lab but I would need a nurse to be with him here at night.[facility] offers a private duty nurse for such occassions.            Mr. Betschart has a history spanning from [place] 2022 to now raised to very impairing strokes.  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  The progress seem to have been limited somewhat by bouts of lethargy, there are cognitive deficits and especially communication deficits.   He is now in a nursing facility for progressive therapy.   He resides at [facility].  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   He had fallen at church backwards striking the occiput on Jul 26, 2020 and in the emergency room there was initially no acute finding noted the day after he returned with difficulty speaking and now with right-sided weakness.   Dr. [name] evacuated the left-sided traumatic subdural hemorrhage that had caused mass effect and subfalcine herniation.               Review of Systems: Out of a complete 14 system review, the patient complains of only the following symptoms, and all other reviewed systems are negative.:   Social History   Socioeconomic History   Marital status: Married    Spouse name: [name] [name]   Number of children: 2   Years of education: BS   Highest education level: Not on file  Occupational History   Occupation: retired  Tobacco Use   Smoking status: Never   Smokeless tobacco: Never  Vaping Use   Vaping Use: Never used  Substance and Sexual Activity   Alcohol use: Never    Comment: quit 5/22   Drug use: Never   Sexual activity: Not Currently    Partners: Female  Other Topics Concern   Not on file  Social History Narrative   10/27/20 residing at [facility] care center since 09/17/20    [employer] - BS,  JD. married - '67. 2 sons - '74, '77  one son is a Oceanographer for [employer] - [employer]; 4 grandchildren. 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 marriage in good health. End-of-life: discussed issues and provided packet with the MOST form and out of facility order.      Caffeine: decaf coffee   Social Determinants of Health   Financial Resource Strain: Low Risk  (06/27/2018)   Overall  Financial Resource Strain (CARDIA)    Difficulty of Paying Living Expenses: Not hard at all  Food Insecurity: No Food Insecurity (06/27/2018)   Hunger Vital Sign    Worried About Running Out of Food in the Last Year: Never true    Ran Out of Food in the Last Year: Never true  Transportation Needs: No Transportation Needs (06/27/2018)   PRAPARE - Administrator, Civil Service (Medical): No    Lack of Transportation (Non-Medical): No  Physical Activity: Not on file  Stress: Not on file  Social Connections: Not on file    Family History  Problem Relation Age of Onset   Macular degeneration Mother        macular degeneration   Cancer Father        intestinal/GI   Diabetes Paternal Aunt    Heart attack Maternal Grandfather    Diabetes Brother     Past Medical History:  Diagnosis Date   Acute rheumatic heart disease, unspecified    childhood, age  48 & 10   Acute rheumatic pericarditis    Atrial fibrillation (HCC)    history   CHF (congestive heart failure) (HCC)    Diverticulosis    Dysrhythmia    Enlarged aorta (HCC) 2019   External hemorrhoids without mention of complication    H/O aortic valve replacement    H/O mitral valve replacement    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   Lesion of ulnar nerve    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    Other and unspecified hyperlipidemia    Pre-diabetes    Previous back surgery 1978, jan 2007   Psychosexual dysfunction with inhibited sexual excitement    SOB (shortness of breath)    "with heavy exercise"   Stroke (HCC) 08/2013   "I WAS IN AFIB AND THREW A CLOT, THE EFFECTS WERE TRANSITORY AND DIDNT LAST BUT FOR 30 MINUTES"    Thoracic aortic aneurysm [facility])     Past Surgical History:  Procedure Laterality Date   AORTIC AND MITRAL VALVE REPLACEMENT     09/2004   CARDIOVERSION     3 times from 2004-2006   CARDIOVERSION N/A 09/26/2013   Procedure: CARDIOVERSION;  Surgeon: Laurey Morale, MD;   Location: [facility] ENDOSCOPY;  Service: Cardiovascular;  Laterality: N/A;   CARDIOVERSION N/A 06/19/2014   Procedure: CARDIOVERSION;  Surgeon: Jake Bathe, MD;  Location: [facility] ENDOSCOPY;  Service: Cardiovascular;  Laterality: N/A;   CARDIOVERSION N/A 04/24/2017   Procedure: CARDIOVERSION;  Surgeon: Laurey Morale, MD;  Location: [facility] ENDOSCOPY;  Service: Cardiovascular;  Laterality: N/A;   CARDIOVERSION N/A 06/08/2017   Procedure: CARDIOVERSION;  Surgeon: Chrystie Nose, MD;  Location: [facility] ENDOSCOPY;  Service: Cardiovascular;  Laterality: N/A;   CARDIOVERSION N/A 08/24/2017   Procedure: CARDIOVERSION;  Surgeon: Chilton Si, MD;  Location: [facility] ENDOSCOPY;  Service: Cardiovascular;  Laterality: N/A;   CARDIOVERSION N/A 02/14/2018   Procedure: CARDIOVERSION;  Surgeon: [name]  S, MD;  Location: [facility] ENDOSCOPY;  Service: Cardiovascular;  Laterality: N/A;   COLONOSCOPY     CRANIOTOMY N/A 07/28/2020   Procedure: CRANIOTOMY FOR EVACUATION OF SUBDURAL HEMATOMA;  Surgeon: Tia Alert, MD;  Location: [facility] OR;  Service: Neurosurgery;  Laterality: N/A;   IR ANGIO VERTEBRAL SEL SUBCLAVIAN INNOMINATE UNI R MOD SED  08/07/2020   IR CT HEAD LTD  08/07/2020   IR PERCUTANEOUS ART THROMBECTOMY/INFUSION INTRACRANIAL INC DIAG ANGIO  08/07/2020   laminectomies     10/1975 and in 03/2005   RADIOLOGY WITH ANESTHESIA N/A 08/07/2020   Procedure: IR WITH ANESTHESIA;  Surgeon: Julieanne Cotton, MD;  Location: [facility] OR;  Service: Radiology;  Laterality: N/A;   TEE WITHOUT CARDIOVERSION N/A 12/18/2020   Procedure: TRANSESOPHAGEAL ECHOCARDIOGRAM (TEE);  Surgeon: Laurey Morale, MD;  Location: [facility] ENDOSCOPY;  Service: Cardiovascular;  Laterality: N/A;   TONSILLECTOMY     1950   TOTAL HIP ARTHROPLASTY Right 04/03/2018   Procedure: TOTAL HIP ARTHROPLASTY ANTERIOR APPROACH;  Surgeon: Durene Romans, MD;  Location: [facility] ORS;  Service: Orthopedics;  Laterality: Right;  70 mins     Current Outpatient Medications on File Prior to Visit   Medication Sig Dispense Refill   amoxicillin (AMOXIL) 500 MG capsule Take 500 mg by mouth 2 (two) times daily.     clobetasol (TEMOVATE) 0.05 % external solution Apply 1 application  topically as needed (itching).     diazePAM, 15 MG Dose, (VALTOCO 15 MG DOSE) 2 x 7.5 MG/0.1ML LQPK Place 7.5 mg into each nostril (total of 15 mg) for grand mal seizure lasting over 5 minutes. Can repeat once in 4 hours. Do Not use more than twice in 24 hours 2 each 1   finasteride (PROSCAR) 5 MG tablet Take 1 tablet (5 mg total) by mouth daily. 30 tablet 0   Glucerna (GLUCERNA) LIQD Take 237 mLs by mouth daily as needed.     lacosamide (VIMPAT) 10 MG/ML oral solution Take 12.5 mLs (125 mg total) by mouth at bedtime. 50mg  in AM and 125mg  in PM per Dr. Vickey [name] 07/26/22 600 mL 0   UNABLE TO FIND BiPAP  Apply BiPAP @ Q[facility], Make sure no water or moisture in hose (use spare hose if necessary) Reapply if taken off. Special Instructions: Check hose for moisture/water, if present use spare hose [DX: Obstructive sleep apnea (adult) (pediatric)] At Bedtime; 09:00 PM - 10:00 PM     acetaminophen (TYLENOL) 325 MG tablet Take 650 mg by mouth as needed for mild pain. (Patient not taking: Reported on 08/25/2022)     Amantadine HCl 100 MG tablet Take 100 mg by mouth 2 (two) times daily. (Patient not taking: Reported on 08/25/2022)     apixaban (ELIQUIS) 5 MG TABS tablet Take 1 tablet (5 mg total) by mouth 2 (two) times daily. (Patient not taking: Reported on 08/25/2022) 60 tablet 0   Artificial Saliva (BIOTENE MOISTURIZING MOUTH MT) Use as directed 2 sprays in the mouth or throat in the morning, at noon, and at bedtime. (Patient not taking: Reported on 08/25/2022)     atorvastatin (LIPITOR) 80 MG tablet Take 1 tablet (80 mg total) by mouth daily. (Patient not taking: Reported on 08/25/2022)     bisacodyl (DULCOLAX) 10 MG suppository Place 1 suppository (10 mg total) rectally daily as needed for moderate constipation or mild  constipation. (Patient not taking: Reported on 08/25/2022)     brivaracetam (BRIVIACT) 10 MG/ML suspension Take 5 mLs (50 mg total) by mouth 2 (two)  times daily. (Patient not taking: Reported on 08/25/2022) 300 mL 5   dofetilide (TIKOSYN) 125 MCG capsule Take 3 capsules (375 mcg total) by mouth 2 (two) times daily. (Patient not taking: Reported on 08/25/2022)     famotidine (PEPCID) 20 MG tablet Take 20 mg by mouth at bedtime as needed for indigestion. (Patient not taking: Reported on 08/25/2022)     hydrocortisone 2.5 % cream Apply 1 application. topically daily as needed (itching). (Patient not taking: Reported on 08/25/2022)     magnesium gluconate (MAGONATE) 500 MG tablet Take 1 tablet (500 mg total) by mouth daily. (Patient not taking: Reported on 08/25/2022) 30 tablet 0   melatonin 5 MG TABS Take 5 mg by mouth at bedtime. (Patient not taking: Reported on 08/25/2022)     methenamine (MANDELAMINE) 1 g tablet Take 1,000 mg by mouth 2 (two) times daily. (Patient not taking: Reported on 08/25/2022)     methylphenidate (RITALIN) 5 MG tablet Take 1 tablet (5 mg total) by mouth 2 (two) times daily. Take at 7 am and 12 pm (Patient not taking: Reported on 08/25/2022) 60 tablet 0   Multiple Vitamin (MULTIVITAMIN WITH MINERALS) TABS tablet Take 1 tablet by mouth daily. (Patient not taking: Reported on 08/25/2022)     Nebulizers MISC by Does not apply route as needed. (Patient not taking: Reported on 08/25/2022)     omeprazole (PRILOSEC) 20 MG capsule Take 20 mg by mouth daily. (Patient not taking: Reported on 08/25/2022)     OXYGEN Inhale into the lungs as directed. Oxygen at 2L [place] - titrate for sat > or = to 90%; May leave on for SOB Every Shift - PRN; PRN 1, PRN 2, PRN 3 (Patient not taking: Reported on 08/25/2022)     Polyethyl Glycol-Propyl Glycol (SYSTANE) 0.4-0.3 % SOLN Place 2 drops into both eyes daily. (Patient not taking: Reported on 08/25/2022)     polyethylene glycol (MIRALAX / GLYCOLAX) 17 g packet Take 17 g  by mouth daily. (Patient not taking: Reported on 08/25/2022)     potassium chloride (KLOR-CON M) 10 MEQ tablet Take 20 mEq by mouth daily. (Patient not taking: Reported on 08/25/2022)     senna-docusate (SENOKOT-S) 8.6-50 MG tablet Take 1 tablet by mouth at bedtime as needed. (Patient not taking: Reported on 08/25/2022)     torsemide (DEMADEX) 20 MG tablet Take 40 mg by mouth daily. 7:30 am -10 pm  Special Instructions: Extra 20 mg of Torsemide Tuesday and Thursday each week Total of 60 mg Once A Day on Tue, Thu; 08:00 AM - 10:00 AM (Patient not taking: Reported on 08/25/2022)     No current facility-administered medications on file prior to visit.    Allergies  Allergen Reactions   Naproxen Hives   No [facility] Food Allergies Other (See Comments)    Scallops - distress, nausea and vomitting     DIAGNOSTIC DATA (LABS, IMAGING, TESTING) - I reviewed patient records, labs, notes, testing and imaging myself where available.  Lab Results  Component Value Date   WBC 9.0 08/10/2022   HGB 12.2 (L) 08/10/2022   HCT 37.1 (L) 08/10/2022   MCV 93.0 08/10/2022   PLT 220 08/10/2022      Component Value Date/Time   NA 147 (H) 08/10/2022 1102   NA 145 08/02/2022 0000   K 3.8 08/10/2022 1102   K 4.1 11/26/2013 0000   CL 107 08/10/2022 1102   CL 104 11/26/2013 0000   CO2 25 08/10/2022 1102   CO2 22  11/26/2013 0000   GLUCOSE 136 (H) 08/10/2022 1102   BUN 31 (H) 08/10/2022 1102   BUN 30 (A) 08/02/2022 0000   CREATININE 1.10 08/10/2022 1102   CALCIUM 9.4 08/10/2022 1102   CALCIUM 8.5 11/26/2013 0000   PROT 7.3 08/10/2022 1102   ALBUMIN 4.2 06/07/2022 0000   ALBUMIN 3.6 11/26/2013 0000   AST 34 08/10/2022 1102   AST 63 11/26/2013 0000   ALT 54 (H) 08/10/2022 1102   ALT 77 11/26/2013 0000   ALKPHOS 107 06/07/2022 0000   ALKPHOS 78 11/26/2013 0000   BILITOT 1.0 08/10/2022 1102   BILITOT 0.7 11/26/2013 0000   GFRNONAA >60 01/03/2022 1322   GFRNONAA 53 11/26/2013 0000   GFRAA >90  02/16/2021 0000   GFRAA >60 11/26/2013 0000   Lab Results  Component Value Date   CHOL 98 06/14/2022   HDL 32 (A) 06/14/2022   LDLCALC 54 06/14/2022   TRIG 57 06/14/2022   CHOLHDL 2.8 08/08/2020   Lab Results  Component Value Date   HGBA1C 6.3 07/05/2022   Lab Results  Component Value Date   VITAMINB12 956 (H) 01/24/2021   Lab Results  Component Value Date   TSH 1.74 07/05/2022    PHYSICAL EXAM:  Today's Vitals   08/25/22 1049  BP: 111/66  Pulse: (!) 58  Height: 5\' 7"  (1.702 m)   Body mass index is 22.35 kg/m.   Wt Readings from Last 3 Encounters:  07/07/22 142 lb 11.2 oz (64.7 kg)  06/30/22 143 lb (64.9 kg)  06/06/22 141 lb 3.2 oz (64 kg)     Ht Readings from Last 3 Encounters:  08/25/22 5\' 7"  (1.702 m)  07/27/22 5\' 7"  (1.702 m)  07/07/22 5\' 7"  (1.702 m)      General: The patient is awake, alert and appears not in acute distress. The patient is well groomed. Head: Normocephalic, atraumatic.  Neck is limited ROM Mallampati 2,  neck circumference:15.75 inches . Nasal airflow  patent.  Retrognathia is not  seen. Left si oft palate is lower.  Dental status: biological. Cardiovascular:  Regular rate and cardiac rhythm by pulse,  without distended neck veins. Respiratory: Lungs are clear to auscultation. No deep breathing.   Skin:  Without evidence of ankle edema, or rash. Trunk: The patient's posture is erect.   Neurologic exam :The patient is drowsy. Memory not tested. Speech is non fluent . Dysphonia.  Mood and affect are appropriate.   Cranial nerves: no loss of smell or taste reported  Pupils are equal and briskly reactive to light. Funduscopic exam deferred. .  Extraocular movements in vertical and horizontal planes were not following- he clenched his eyes shut..  Visual fields by finger perimetry are intact. Hearing was intact to soft voice and finger rubbing.    Facial sensation intact to fine touch.  left facial weakness, masked face.  Neck  ROM : rotation, tilt and flexion extension were normal for age and shoulder shrug was symmetrical.    Motor exam:  Symmetric bulk, tone and ROM.   Normal tone without cog -wheeling, symmetric grip strength .   Sensory:  Fine touch and vibration were tested  and  normal.  Proprioception tested in the upper extremities was normal.   Coordination: spatic hemiparesis left and delayed relaxation on the right had, related to cerebellar lesion.  Gait and station:  deferred.  Deep tendon reflexes: in the upper and lower extremities are spastic. Babinski response was bilaterally up going.  ASSESSMENT AND PLAN 82 y.o. year old male  here with:    1) we decided today to return to a 50 mg twice daily regimen on Vimpat and keep the patient for now on Briviact.  My hope is that he will be less overly sedated.  All other medications will stay the same.  I will also have him continue the amantadine.  He is working with speech therapy. Ritalin has been used as a stimulant and has not have much of an effect but we will not change more than one variable.  Phone follow up in 4 weeks , for discussion about further reduction, to check in on seizure activity/ break through/ sedation.   3) BiPAP was not addressed.    I plan to follow up either personally or through our NP within 4 months.   I would like to thank Mahlon Gammon, MD and Mahlon Gammon, Md [address] [place],  [place] [address] for allowing me to meet with and to take care of this pleasant patient.     After spending a total time of  30  minutes face to face and additional time for physical and neurologic examination, review of laboratory studies,  personal review of imaging studies, reports and results of other testing and review of referral information / records as far as provided in visit,   Electronically signed by: Melvyn Novas, MD 08/25/2022 11:03 AM  [facility] and [facility] Board certified  by The [employer] of Sleep Medicine and Diplomate of the [employer] of Sleep Medicine. Board certified In Neurology through the ABPN, Fellow of the [employer] of Neurology.

## 2022-08-25 NOTE — Telephone Encounter (Signed)
[name]) regards to the dosages wrote. Dosage is significantly lower than what is written, would like call back to verify the dosage.

## 2022-08-25 NOTE — Telephone Encounter (Signed)
Clarification form sent back. 50 mg twice daily Vimpat and keep the patient for now on Briviact 50 mg total by mouth 2 times daily.

## 2022-08-25 NOTE — Telephone Encounter (Signed)
Contacted [name] [name] back, she needed clarification on Briviact due to it having 50mg  on the order sheet but marked out with 10 on top. I spoke with MD, she is keeping the Briviact the same, 50mg  BID but changed the vimpat. Rene Kocher will be faxing  new order sheet to sign, POD 3 fax number provided.

## 2022-08-25 NOTE — Patient Instructions (Signed)
82 y.o. year old male  here with:     1) we decided today to return to a 50 mg twice daily regimen on Vimpat and keep the patient for now on Briviact.  My hope is that he will be less overly sedated.  All other medications will stay the same.   I will also have him continue the amantadine.  He is working with speech therapy. Ritalin has been used as a stimulant and has not have much of an effect but we will not change more than one variable  Phone follow up in 4 weeks , for discussion about further reduction, to check in on seizure activity/ break through/ sedation.    3) BiPAP was not addressed.      I plan to follow up either personally or through our NP within 4 months.    I would like to thank Mahlon Gammon, MD

## 2022-08-26 DIAGNOSIS — I5022 Chronic systolic (congestive) heart failure: Secondary | ICD-10-CM | POA: Diagnosis not present

## 2022-08-26 DIAGNOSIS — I69354 Hemiplegia and hemiparesis following cerebral infarction affecting left non-dominant side: Secondary | ICD-10-CM | POA: Diagnosis not present

## 2022-08-26 DIAGNOSIS — R41841 Cognitive communication deficit: Secondary | ICD-10-CM | POA: Diagnosis not present

## 2022-08-26 DIAGNOSIS — S065X3S Traumatic subdural hemorrhage with loss of consciousness of 1 hour to 5 hours 59 minutes, sequela: Secondary | ICD-10-CM | POA: Diagnosis not present

## 2022-08-26 DIAGNOSIS — G4089 Other seizures: Secondary | ICD-10-CM | POA: Diagnosis not present

## 2022-08-26 DIAGNOSIS — R1312 Dysphagia, oropharyngeal phase: Secondary | ICD-10-CM | POA: Diagnosis not present

## 2022-08-29 DIAGNOSIS — G4089 Other seizures: Secondary | ICD-10-CM | POA: Diagnosis not present

## 2022-08-29 DIAGNOSIS — I5022 Chronic systolic (congestive) heart failure: Secondary | ICD-10-CM | POA: Diagnosis not present

## 2022-08-29 DIAGNOSIS — S065X3S Traumatic subdural hemorrhage with loss of consciousness of 1 hour to 5 hours 59 minutes, sequela: Secondary | ICD-10-CM | POA: Diagnosis not present

## 2022-08-29 DIAGNOSIS — I69354 Hemiplegia and hemiparesis following cerebral infarction affecting left non-dominant side: Secondary | ICD-10-CM | POA: Diagnosis not present

## 2022-08-29 DIAGNOSIS — R41841 Cognitive communication deficit: Secondary | ICD-10-CM | POA: Diagnosis not present

## 2022-08-29 DIAGNOSIS — R1312 Dysphagia, oropharyngeal phase: Secondary | ICD-10-CM | POA: Diagnosis not present

## 2022-08-30 DIAGNOSIS — I38 Endocarditis, valve unspecified: Secondary | ICD-10-CM | POA: Diagnosis not present

## 2022-08-30 DIAGNOSIS — E871 Hypo-osmolality and hyponatremia: Secondary | ICD-10-CM | POA: Diagnosis not present

## 2022-08-31 DIAGNOSIS — I69354 Hemiplegia and hemiparesis following cerebral infarction affecting left non-dominant side: Secondary | ICD-10-CM | POA: Diagnosis not present

## 2022-08-31 DIAGNOSIS — I5022 Chronic systolic (congestive) heart failure: Secondary | ICD-10-CM | POA: Diagnosis not present

## 2022-08-31 DIAGNOSIS — G4089 Other seizures: Secondary | ICD-10-CM | POA: Diagnosis not present

## 2022-08-31 DIAGNOSIS — R41841 Cognitive communication deficit: Secondary | ICD-10-CM | POA: Diagnosis not present

## 2022-08-31 DIAGNOSIS — R1312 Dysphagia, oropharyngeal phase: Secondary | ICD-10-CM | POA: Diagnosis not present

## 2022-08-31 DIAGNOSIS — S065X3S Traumatic subdural hemorrhage with loss of consciousness of 1 hour to 5 hours 59 minutes, sequela: Secondary | ICD-10-CM | POA: Diagnosis not present

## 2022-09-01 ENCOUNTER — Ambulatory Visit (HOSPITAL_COMMUNITY)
Admission: RE | Admit: 2022-09-01 | Discharge: 2022-09-01 | Disposition: A | Discharge status: Home / Self Care | Payer: Medicare Other | Source: Ambulatory Visit | Attending: Cardiology | Admitting: Cardiology

## 2022-09-01 ENCOUNTER — Encounter (HOSPITAL_COMMUNITY): Payer: Self-pay | Admitting: Cardiology

## 2022-09-01 ENCOUNTER — Non-Acute Institutional Stay (SKILLED_NURSING_FACILITY): Payer: Medicare Other | Admitting: Adult Health

## 2022-09-01 ENCOUNTER — Encounter: Payer: Self-pay | Admitting: Adult Health

## 2022-09-01 VITALS — BP 90/50 | HR 64

## 2022-09-01 DIAGNOSIS — I69354 Hemiplegia and hemiparesis following cerebral infarction affecting left non-dominant side: Secondary | ICD-10-CM | POA: Diagnosis not present

## 2022-09-01 DIAGNOSIS — Z953 Presence of xenogenic heart valve: Secondary | ICD-10-CM | POA: Insufficient documentation

## 2022-09-01 DIAGNOSIS — R569 Unspecified convulsions: Secondary | ICD-10-CM | POA: Diagnosis not present

## 2022-09-01 DIAGNOSIS — Z792 Long term (current) use of antibiotics: Secondary | ICD-10-CM | POA: Diagnosis not present

## 2022-09-01 DIAGNOSIS — I484 Atypical atrial flutter: Secondary | ICD-10-CM | POA: Diagnosis not present

## 2022-09-01 DIAGNOSIS — I5032 Chronic diastolic (congestive) heart failure: Secondary | ICD-10-CM | POA: Insufficient documentation

## 2022-09-01 DIAGNOSIS — Z7901 Long term (current) use of anticoagulants: Secondary | ICD-10-CM | POA: Insufficient documentation

## 2022-09-01 DIAGNOSIS — R634 Abnormal weight loss: Secondary | ICD-10-CM

## 2022-09-01 DIAGNOSIS — Z79899 Other long term (current) drug therapy: Secondary | ICD-10-CM | POA: Insufficient documentation

## 2022-09-01 DIAGNOSIS — I7121 Aneurysm of the ascending aorta, without rupture: Secondary | ICD-10-CM | POA: Diagnosis not present

## 2022-09-01 DIAGNOSIS — Z9981 Dependence on supplemental oxygen: Secondary | ICD-10-CM | POA: Diagnosis not present

## 2022-09-01 DIAGNOSIS — I5022 Chronic systolic (congestive) heart failure: Secondary | ICD-10-CM

## 2022-09-01 DIAGNOSIS — Z8249 Family history of ischemic heart disease and other diseases of the circulatory system: Secondary | ICD-10-CM | POA: Insufficient documentation

## 2022-09-01 DIAGNOSIS — I052 Rheumatic mitral stenosis with insufficiency: Secondary | ICD-10-CM | POA: Insufficient documentation

## 2022-09-01 DIAGNOSIS — E785 Hyperlipidemia, unspecified: Secondary | ICD-10-CM | POA: Insufficient documentation

## 2022-09-01 DIAGNOSIS — I110 Hypertensive heart disease with heart failure: Secondary | ICD-10-CM | POA: Diagnosis not present

## 2022-09-01 LAB — CBC
HCT: 36.7 % — ABNORMAL LOW (ref 39.0–52.0)
Hemoglobin: 11.7 g/dL — ABNORMAL LOW (ref 13.0–17.0)
MCH: 30.6 pg (ref 26.0–34.0)
MCHC: 31.9 g/dL (ref 30.0–36.0)
MCV: 96.1 fL (ref 80.0–100.0)
Platelets: 171 10*3/uL (ref 150–400)
RBC: 3.82 MIL/uL — ABNORMAL LOW (ref 4.22–5.81)
RDW: 15.8 % — ABNORMAL HIGH (ref 11.5–15.5)
WBC: 9.3 10*3/uL (ref 4.0–10.5)
nRBC: 0 % (ref 0.0–0.2)

## 2022-09-01 LAB — BASIC METABOLIC PANEL
Anion gap: 10 (ref 5–15)
BUN: 29 mg/dL — ABNORMAL HIGH (ref 8–23)
CO2: 26 mmol/L (ref 22–32)
Calcium: 9 mg/dL (ref 8.9–10.3)
Chloride: 108 mmol/L (ref 98–111)
Creatinine, Ser: 1.06 mg/dL (ref 0.61–1.24)
GFR, Estimated: >60 mL/min (ref >60)
Glucose, Bld: 158 mg/dL — ABNORMAL HIGH (ref 70–99)
Potassium: 3.4 mmol/L — ABNORMAL LOW (ref 3.5–5.1)
Sodium: 144 mmol/L (ref 135–145)

## 2022-09-01 LAB — MAGNESIUM: Magnesium: 2.5 mg/dL — ABNORMAL HIGH (ref 1.7–2.4)

## 2022-09-01 NOTE — Progress Notes (Signed)
Location:  [facility] Room Number: 127A Place of Service:  SNF [phone]) Provider:  Fletcher Anon, NP    Patient Care Team: Mahlon Gammon, MD as PCP - General (Internal Medicine) Laurey Morale, MD as PCP - Cardiology (Cardiology) Mia Creek, MD as Consulting Physician (Ophthalmology) Etta Grandchild, MD (Internal Medicine)  Extended Emergency Contact Information Primary Emergency Contact: Kae Heller Address: [address]          [place], [place] [address] [place] Home Phone: [phone] Mobile Phone: [phone] Relation: Spouse Secondary Emergency Contact: Phineas Inches, [place] [address] [place] Home Phone: [phone] Relation: Son  Code Status:  DNR Goals of care: Advanced Directive information    09/01/2022   10:12 AM  Advanced Directives  Does Patient Have a Medical Advance Directive? Yes  Type of Estate agent of [place];Living will;Out of facility DNR (pink MOST or yellow form)  Does patient want to make changes to medical advance directive? No - Patient declined  Copy of Healthcare Power of Attorney in Chart? Yes - validated most recent copy scanned in chart (See row information)     Chief Complaint  Patient presents with   Acute Visit    Weight loss      HPI:  Pt is a 82 y.o. male seen today for an acute visit for weight loss. He resides in skilled care at [facility] with a hx of CHF and is followed by Dr Shirlee Latch on torsemide daily. He has hx of CVA and TBI and afib, tends to have difficulty taking in enough fluid.  The nurse reports Ms. Meditz (pt's wife) requested the torsemide be held and weight reviewed. His weight have been variable but he is down 10+ lbs on average over the past two months. BMP is monitored weekly due to hx of hypernatremia. NA 6/24 was 148, 6/27 NA 144. His wife had been reporting he was talking less and seemed more  sedated. He is on vimpat and Briviact for seizures and saw neurology 6/20 and had a dose reduction in Vimpat for this reason. So far his wife has not noticed a difference in his mentation. The nurse reports he had a "good breakfast" this  morning. Intakes range 25-50 and 50-75% recorded in matrix. He is a on a D3 diet with NTL.   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    Other and unspecified hyperlipidemia    Pre-diabetes    Previous back surgery 1978, jan 2007   Psychosexual dysfunction with inhibited sexual excitement    SOB (shortness of breath)    "with heavy exercise"   Stroke (HCC) 08/2013   "I WAS IN AFIB AND THREW A CLOT, THE EFFECTS WERE TRANSITORY AND DIDNT LAST BUT FOR 30 MINUTES"    Thoracic aortic aneurysm (HCC)  Past Surgical History:  Procedure Laterality Date   AORTIC AND MITRAL VALVE REPLACEMENT     09/2004   CARDIOVERSION     3 times from 2004-2006   CARDIOVERSION N/A 09/26/2013   Procedure: CARDIOVERSION;  Surgeon: Laurey Morale, MD;  Location: [facility] ENDOSCOPY;  Service: Cardiovascular;  Laterality: N/A;   CARDIOVERSION N/A 06/19/2014   Procedure: CARDIOVERSION;  Surgeon: Jake Bathe, MD;  Location: [facility] ENDOSCOPY;  Service: Cardiovascular;  Laterality: N/A;   CARDIOVERSION N/A 04/24/2017   Procedure: CARDIOVERSION;  Surgeon: Laurey Morale, MD;  Location: [facility] ENDOSCOPY;  Service: Cardiovascular;  Laterality: N/A;   CARDIOVERSION N/A 06/08/2017   Procedure: CARDIOVERSION;  Surgeon:  Chrystie Nose, MD;  Location: [facility] ENDOSCOPY;  Service: Cardiovascular;  Laterality: N/A;   CARDIOVERSION N/A 08/24/2017   Procedure: CARDIOVERSION;  Surgeon: Chilton Si, MD;  Location: [facility] ENDOSCOPY;  Service: Cardiovascular;  Laterality: N/A;   CARDIOVERSION N/A 02/14/2018   Procedure: CARDIOVERSION;  Surgeon: Laurey Morale, MD;  Location: [facility] ENDOSCOPY;  Service: Cardiovascular;  Laterality: N/A;   COLONOSCOPY     CRANIOTOMY N/A 07/28/2020   Procedure: CRANIOTOMY FOR EVACUATION OF SUBDURAL HEMATOMA;  Surgeon: Tia Alert, MD;  Location: [facility] OR;  Service: Neurosurgery;  Laterality: N/A;   IR ANGIO VERTEBRAL SEL SUBCLAVIAN INNOMINATE UNI R MOD SED  08/07/2020   IR CT HEAD LTD  08/07/2020   IR PERCUTANEOUS ART THROMBECTOMY/INFUSION INTRACRANIAL INC DIAG ANGIO  08/07/2020   laminectomies     10/1975 and in 03/2005   RADIOLOGY WITH ANESTHESIA N/A 08/07/2020   Procedure: IR WITH ANESTHESIA;  Surgeon: Julieanne Cotton, MD;  Location: [facility] OR;  Service: Radiology;  Laterality: N/A;   TEE WITHOUT CARDIOVERSION N/A 12/18/2020   Procedure: TRANSESOPHAGEAL ECHOCARDIOGRAM (TEE);  Surgeon: Laurey Morale, MD;  Location: [facility] ENDOSCOPY;  Service: Cardiovascular;  Laterality: N/A;   TONSILLECTOMY     1950   TOTAL HIP ARTHROPLASTY Right 04/03/2018   Procedure: TOTAL HIP ARTHROPLASTY ANTERIOR APPROACH;  Surgeon: Durene Romans, MD;  Location: [facility] ORS;  Service: Orthopedics;  Laterality: Right;  70 mins    Allergies  Allergen Reactions   Naproxen Hives   No [employer] Food Allergies Other (See Comments)    Scallops - distress, nausea and vomitting    Outpatient Encounter Medications as of 09/01/2022  Medication Sig   acetaminophen (TYLENOL) 325 MG tablet Take 650 mg by mouth as needed for mild pain.   Amantadine HCl 100 MG tablet Take 100 mg by mouth 2 (two) times daily.   amoxicillin (AMOXIL) 500 MG capsule Take 500 mg by mouth 2 (two) times daily.   apixaban (ELIQUIS) 5 MG TABS tablet Take 1  tablet (5 mg total) by mouth 2 (two) times daily.   Artificial Saliva (BIOTENE MOISTURIZING MOUTH MT) Use as directed 2 sprays in the mouth or throat in the morning, at noon, and at bedtime.   atorvastatin (LIPITOR) 40 MG tablet Take 40 mg by mouth daily.   bisacodyl (DULCOLAX) 10 MG suppository Place 1 suppository (10 mg total) rectally daily as needed for moderate constipation or mild constipation.   brivaracetam (BRIVIACT) 10 MG/ML suspension Take 5 mLs (50 mg total) by mouth 2 (two) times daily.   clobetasol (TEMOVATE) 0.05 % external solution Apply 1 application  topically as needed (itching).   diazePAM, 15 MG Dose, (VALTOCO 15 MG DOSE) 2 x 7.5 MG/0.1ML LQPK Place 7.5 mg into each nostril (total of 15 mg) for grand mal seizure lasting over 5 minutes. Can  repeat once in 4 hours. Do Not use more than twice in 24 hours   dofetilide (TIKOSYN) 125 MCG capsule Take 3 capsules (375 mcg total) by mouth 2 (two) times daily.   famotidine (PEPCID) 20 MG tablet Take 20 mg by mouth at bedtime as needed for indigestion.   finasteride (PROSCAR) 5 MG tablet Take 1 tablet (5 mg total) by mouth daily.   Glucerna (GLUCERNA) LIQD Take 237 mLs by mouth daily as needed.   hydrocortisone 2.5 % cream Apply 1 application  topically daily as needed (itching).   lacosamide (VIMPAT) 10 MG/ML oral solution Take 12.5 mLs (125 mg total) by mouth at bedtime. 50mg  in AM and 125mg  in PM per Dr. Vickey Huger 07/26/22   magnesium gluconate (MAGONATE) 500 MG tablet Take 1 tablet (500 mg total) by mouth daily.   melatonin 5 MG TABS Take 5 mg by mouth at bedtime.   methenamine (MANDELAMINE) 1 g tablet Take 1,000 mg by mouth 2 (two) times daily.   methylphenidate (RITALIN) 5 MG tablet Take 1 tablet (5 mg total) by mouth 2 (two) times daily. Take at 7 am and 12 pm   Multiple Vitamin (MULTIVITAMIN WITH MINERALS) TABS tablet Take 1 tablet by mouth daily.   Nebulizers MISC by Does not apply route as needed.   omeprazole (PRILOSEC) 20 MG  capsule Take 20 mg by mouth daily.   OXYGEN Inhale into the lungs as directed. Oxygen at 2L [place] - titrate for sat > or = to 90%; May leave on for SOB Every Shift - PRN; PRN 1, PRN 2, PRN 3   Polyethyl Glycol-Propyl Glycol (SYSTANE) 0.4-0.3 % SOLN Place 2 drops into both eyes daily.   polyethylene glycol (MIRALAX / GLYCOLAX) 17 g packet Take 17 g by mouth daily.   potassium chloride (KLOR-CON M) 10 MEQ tablet Take 20 mEq by mouth daily.   senna-docusate (SENOKOT-S) 8.6-50 MG tablet Take 1 tablet by mouth at bedtime as needed.   torsemide (DEMADEX) 20 MG tablet Take 40 mg by mouth daily. 7:30 am -10 pm  Special Instructions: Extra 20 mg of Torsemide Tuesday and Thursday each week Total of 60 mg Once A Day on Tue, Thu; 08:00 AM - 10:00 AM   UNABLE TO FIND BiPAP  Apply BiPAP @ [facility], Make sure no water or moisture in hose (use spare hose if necessary) Reapply if taken off. Special Instructions: Check hose for moisture/water, if present use spare hose [DX: Obstructive sleep apnea (adult) (pediatric)] At Bedtime; 09:00 PM - 10:00 PM   No facility-administered encounter medications on file as of 09/01/2022.    Review of Systems  Unable to perform ROS: Patient nonverbal    Immunization History  Administered Date(s) Administered   Fluad Quad(high Dose 65+) 12/06/2018, 12/09/2020, 12/10/2021   Influenza Whole 12/25/2008, 01/05/2010, 12/30/2011   Influenza, High Dose Seasonal PF 12/14/2016   Influenza,inj,Quad PF,6+ Mos 12/19/2013   Influenza,inj,quad, With Preservative 11/22/2017   Influenza-Unspecified 12/14/2015, 12/24/2019, 12/10/2021   Moderna Covid-19 Vaccine Bivalent Booster 100yrs & up 01/12/2021   Moderna SARS-COV2 Booster Vaccination 01/02/2020, 06/26/2020   Moderna Sars-Covid-2 Vaccination 04/08/2019, 05/05/2019   Pneumococcal Conjugate-13 12/19/2013   Pneumococcal Polysaccharide-23 09/24/2007, 05/22/2017   Td 12/25/2008   Td (Adult), 2 Lf Tetanus Toxid, Preservative Free  12/25/2008   Tdap 03/22/2018   Zoster Recombinat (Shingrix) 07/16/2019, 10/28/2019   Zoster, Live 09/24/2007   Pertinent  Health Maintenance Due  Topic Date Due   OPHTHALMOLOGY EXAM  10/06/2022 (Originally 10/04/1950)   INFLUENZA VACCINE  10/06/2022   FOOT EXAM  10/23/2022   HEMOGLOBIN A1C  01/04/2023   Colonoscopy  Discontinued      08/11/2021    7:30 PM 08/12/2021    8:00 AM 10/13/2021    2:32 PM 02/02/2022   11:24 AM 04/20/2022    3:01 PM  Fall Risk  Falls in the past year?   0 0 0  Was there an injury with Fall?    0 0  Fall Risk Category Calculator    0 0  Fall Risk Category (Retired)    Low   (RETIRED) Patient Fall Risk Level High fall risk High fall risk  High fall risk   Patient at Risk for Falls Due to    Impaired mobility   Fall risk Follow up    Falls evaluation completed    Functional Status Survey:    Vitals:   09/01/22 1013  BP: 113/66  Pulse: (!) 54  Resp: (!) 24  Temp: 98.5 F (36.9 C)  SpO2: 97%  Weight: 134 lb 6.4 oz (61 kg)  Height: 5\' 7"  (1.702 m)   Body mass index is 21.05 kg/m. Physical Exam Vitals and nursing note reviewed.  Constitutional:      General: He is not in acute distress.    Appearance: He is not diaphoretic.  HENT:     Head: Normocephalic and atraumatic.  Neck:     Thyroid: No thyromegaly.     Vascular: No JVD.     Trachea: No tracheal deviation.  Cardiovascular:     Rate and Rhythm: Normal rate and regular rhythm.     Heart sounds: No murmur heard. Pulmonary:     Effort: No respiratory distress.     Breath sounds: Normal breath sounds. No wheezing.     Comments: RR24 Abdominal:     General: Bowel sounds are normal. There is no distension.     Palpations: Abdomen is soft.     Tenderness: There is no abdominal tenderness.  Musculoskeletal:     Right lower leg: No edema.     Left lower leg: No edema.  Lymphadenopathy:     Cervical: No cervical adenopathy.  Skin:    General: Skin is warm and dry.  Neurological:      Mental Status: He is alert.     Comments: Left sided weakness. Alert but falls asleep while trying to engaging with him. Tries to get out a word during the visit. Intermittently able to f/c.       Labs reviewed: Recent Labs    12/13/21 1157 12/14/21 0000 01/03/22 1322 01/10/22 0000 02/02/22 1202 03/07/22 0000 07/05/22 0000 08/02/22 0000 08/10/22 1102  NA  --    < > 144   < > 145   < > 148* 145 147*  K  --    < > 3.7   < > 3.6   < > 4.1 3.8 3.8  CL  --    < > 108   < > 107   < > 106 108 107  CO2  --    < > 25   < > 28   < > 24* 23* 25  GLUCOSE  --   --  120*  --  126*  --   --   --  136*  BUN  --    < > 27*   < > 32*   < > 24* 30* 31*  CREATININE  --    < > 1.18   < >  1.14   < > 1.0 1.1 1.10  CALCIUM  --    < > 9.1   < > 9.2   < > 9.4 9.1 9.4  MG 2.4  --   --   --   --   --   --   --   --    < > = values in this interval not displayed.   Recent Labs    09/08/21 0000 01/03/22 1322 02/02/22 1202 06/07/22 0000 08/10/22 1102  AST 24 25 22  33 34  ALT 18 20 23  42* 54*  ALKPHOS 105 100  --  107  --   BILITOT  --  1.1 0.9  --  1.0  PROT  --  7.4 7.1  --  7.3  ALBUMIN 3.5 3.6  --  4.2  --    Recent Labs    02/02/22 1202 03/14/22 0000 08/10/22 1102  WBC 9.4 8.1 9.0  NEUTROABS 6,110  --  6,759  HGB 9.0* 10.3* 12.2*  HCT 28.3* 32* 37.1*  MCV 86.0  --  93.0  PLT 261 240 220   Lab Results  Component Value Date   TSH 1.74 07/05/2022   Lab Results  Component Value Date   HGBA1C 6.3 07/05/2022   Lab Results  Component Value Date   CHOL 98 06/14/2022   HDL 32 (A) 06/14/2022   LDLCALC 54 06/14/2022   TRIG 57 06/14/2022   CHOLHDL 2.8 08/08/2020    Significant Diagnostic Results in last 30 days:  No results found.  Assessment/Plan  1. Weight loss Agree that we should hold the torsemide for two days then resume at 40 mg daily. He is no longer on the extra two doses of torsemide on Tuesday and Thursdays which was discontinued earlier in the month due to weight and  low bp.  He does seems to less interactive and verbal and this may be affecting his overall intake.  His wife and the staff try to encourage intake.  Can have dietary f/u Currently on glucerna   Family/ staff Communication: discussed with Ms. Cantu  Labs/tests ordered:  Has BMP weekly

## 2022-09-01 NOTE — Patient Instructions (Signed)
There has been no changes to your medications.  Labs done today, your results will be available in MyChart, we will contact you for abnormal readings.  Your physician has requested that you have an echocardiogram. Echocardiography is a painless test that uses sound waves to create images of your heart. It provides your doctor with information about the size and shape of your heart and how well your heart's chambers and valves are working. This procedure takes approximately one hour. There are no restrictions for this procedure. Please do NOT wear cologne, perfume, aftershave, or lotions (deodorant is allowed). Please arrive 15 minutes prior to your appointment time.  Your physician recommends that you schedule a follow-up appointment in: 6 months ( December) ** please call the office in October to arrange your follow up appointment. **  If you have any questions or concerns before your next appointment please send [place] a message through [place] or call our office at [phone].    TO LEAVE A MESSAGE FOR THE NURSE SELECT OPTION 2, PLEASE LEAVE A MESSAGE INCLUDING: YOUR NAME DATE OF BIRTH CALL BACK NUMBER REASON FOR CALL**this is important as we prioritize the call backs  YOU WILL RECEIVE A CALL BACK THE SAME DAY AS LONG AS YOU CALL BEFORE 4:00 PM  At the [facility], you and your health needs are our priority. As part of our continuing mission to provide you with exceptional heart care, we have created designated Provider Care Teams. These Care Teams include your primary Cardiologist (physician) and Advanced Practice Providers (APPs- Physician Assistants and Nurse Practitioners) who all work together to provide you with the care you need, when you need it.   You may see any of the following providers on your designated Care Team at your next follow up: Dr Arvilla Meres Dr [name] Dr. Marcos Eke, NP Robbie Lis, [place] [name] [place], [place] Brynda Peon, NP Karle Plumber, PharmD   Please be sure to bring in all your medications bottles to every appointment.    Thank you for choosing [facility]

## 2022-09-01 NOTE — Progress Notes (Signed)
Date:  09/01/2022   ID:  [name] [name], DOB 15-Feb-1941, MRN [id number]    Provider location: [facility] Type of Visit: Established patient  PCP:  Mahlon Gammon, MD  Cardiologist:  Dr. Shirlee Latch   History of Present Illness: [name] [name] is a 82 y.o. male who has a history of rheumatic fever and AI/[name] s/p bioprosthetic AVR and MVR at the [facility] as well as MAZE procedure all in 2006.  He also has an ascending aortic aneurysm.   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  I started him on dronedarone and coumadin with plan for cardioversion after 4 wks of therapeutic anticoagulation if he did not convert back to NSR on his own.  He went back into NSR spontaneously and later stopped coumadin. Atrial flutter recurred in 3/15 and again resolved spontaneously.   In 6/15, patient was admitted with transient dysarthria (resolved in about 10 minutes).  However, he was found to have evidence for CVA by MRI.  Therefore, he was started on coumadin. He was noted to be in atypical atrial flutter again.  Ranolazine was added to dronedarone.     In 8/15, [name] [name] was seen in consultation at the [facility] clinic.  He had atrial flutter ablation at [facility] in 11/15.     He was seen in followup at [facility] in 2/16.  Ranolazine was stopped.  Echo in 2/16 showed EF 55% with stable bioprosthetic mitral and aortic valves.  CTA chest showed stable 5 cm ascending aortic aneurysm.  He went back into atypical atrial flutter in 4/16.  Ranolazine was restarted and he was cardioverted back to NSR.      Cardiolite in 12/18 showed no ischemia. He wore a 30 day monitor in 12/18 with no arrhythmia.     He went into atrial flutter again during a respiratory illness in 2/19.  On 04/24/17, he was cardioverted back to NSR.  He thinks that he went into atrial flutter again later on that day. He was back in atrial flutter at followup  appointment. He was then admitted for Tikosyn loading in 4/19 and was cardioverted back to NSR after the load.    He had atrial fibrillation again in 6/19 with DCCV.    He was seen at the [facility] in 9/19.  Echo showed normal LV and RV systolic function, bioprosthetic MV with mean gradient up to 9 mmHg, normal bioprosthetic AoV.  CTA chest showed aortic root up to 5.3 cm.    He was back in atrial flutter in 12/19, had DCCV back to NSR.  Echo in 12/19 showed EF 60-65%, normal bioprosthetic aortic valve, bioprosthetic mitral valve with mean gradient 4 mmHg and no [name], severe LAE, aortic root 4.8 cm.   He went to the [name] in 3/20.  Echo at that time showed EF 60-65% with stable bioprosthetic mitral and aortic valves (mean gradient 7 mmHg across MV and 9 mmHg across AoV).  CTA showed 5.0 cm aortic root and ascending aorta, stable.  He returned to the [facility] in 7/20.  He had atrial fibrillation/flutter ablation again and thinks he has been in NSR since that time.  CTA chest showed 5.1 cm ascending thoracic aorta. Seen at [facility] in 7/21.  Echo showed stable bioprosthetic mitral and aortic valves with normal EF.  CTA chest showed 5.0 cm ascending aortic aneurysm.   Last visit  at [facility] clinic 1/22 he was doing well, exercising and euvolemic in NSR.  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 He subsequently developed right MCA territory stroke (as Eliquis was held) in June 2022, requiring thrombectomy and was discharged to SNF with significant disability.    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 Initial echo showed EF 65-70%, no obvious vegetation. Cards consulted and TEE showed bioprosthetic MV vegetation with trivial [name] and mean gradient 8.  He is not a candidate for mitral valve  replacement.  ID consulted he was started on Rocephin/ampicillin via PICC with a stop date of 01/28/21 then continue on amoxicillin 500 mg bid indefinitely. Hospital course complicated by prolonged encephalopathy.  Neurology consulted and MRI brian showed new left cerebellar vermis CVA, likely septic emboli in the setting of mitral valve endocarditis.  He was discharged back to SNF, weight 187 lbs.  He was readmitted in 5/22 with volume overload and was diuresed. He had transient atrial fibrillation but went back into NSR.   Echo in 1/23 on my review showed  EF 50%, mild-moderate LVH, mildly decreased RV systolic function, bioprosthetic mitral valve appeared to have vegetation present with mild [name], mean gradient 8 mmHg (mild-moderate bioprosthetic valve stenosis), bioprosthetic aortic valve with mean gradient 9 mmHg and no AI, the bioprosthetic aortic valve appeared thickened, 5.1 cm ascending aorta.  Blood cultures were done and showing no growth.  ESR and CRP were normal.  It was suspected that the vegetation noted was sterile.   Echo in 10/23 looked significantly worse in terms of valvular function.  EF 55%, D-shaped septum suggesting RV pressure/volume overload, moderate RVE with moderate RV dysfunction, bioprosthetic mitral valve thickened and appeared to have vegetation, mean MV gradient 20 mmHg with MVA 1.1 cm2 suggesting severe bioprosthetic mitral valve stenosis, mild [name]; bioprosthetic aortic valve was thickened with mean gradient 39 and AVA 1.3 cm2, DI 0.26, moderate-severe bioprosthetic AoV stenosis, IVC dilated. Blood cultures were done in 10/23 and 11/23 due to concern for residual vegetation on mitral valve, both sets were negative.   He returns today for followup of diastolic CHF and mitral valve endocarditis.  He wears 2 L oxygen at all times.  He is awake and appears alert but does not talk. He remains confined to the wheelchair. Per his wife, he has been sleeping more and eating less.  No  BRBPR/melena.  Has had seizures and medications have been adjusted for this. Using Bipap for central sleep apnea.   ECG (personally reviewed): NSR, PACs, QTc 524 mse   Labs (3/14): LDL 64, TSH normal, K 3.9, creatinine 1.0     Labs (6/15): LDL 54, HDL 48, creatinine 0.95   Labs (9/15): LDL 54, HDL 25 Labs (10/15): K 4.5, creatinine 1.3, AST 45, ALT 64 Labs (4/16): K 4.3, creatinine 1.28, HCT 40.7 Labs (5/17): K 4.1, creatinine 1.16, LDL 66, HDL 47 Labs (2/18): LDL 42, HDL 43, K 4.4, creatinine 1.61 Labs (3/18): hgb 13.8 Labs (2/19): LDL 59 Labs (4/19): K 3.9, creatinine 0.97, Mg 2.0 Labs (6/19): K 4.4, creatinine 0.9 Labs (10/19): LDL 84 Labs (12/19): K 4.2, creatinine 1.13, hgb 13.9 Labs (3/20): K 4.1, creatinine 0.98 Labs (6/20): K 4.1, creatinine 0.91, LDL 103  Labs (9/20): K 4, creatinine 0.95 Labs (1/21): K 4, creatinine 0.94, LDL 80 Labs (5/21): K 4.3, creatinine 0.92 Labs (11/22): K 3.2, creatinine 0.6 =>  0.88, WBCs 13, ESR 70 Labs (1/23): hgb 10.2, K 4, creatinine 0.6, AST/ALT normal Labs (3/23): ESR 11, CRP 2.7 Labs (4/23): K 4.3, creatinine 0.68 Labs (9/23): K 4.3, creatinine 0.9 Labs (10/23): K 3.7, creatinine 0.9 Labs (11/23): ESR 39, hgb 9, K 3.6, creatinine 1.14 Labs (6/24): ESR/CRP normal, K 3.8, creatinine 1.10                                                                                                                              PMH: 1. Atrial fibrillation: s/p MAZE and LAA ligation in 2006.  No documented atrial fibrillation since operation.  2. Atypical atrial flutter: 10/13, 3/15, and again in 6/15.  Recurrent 4/16 with DCCV.  - Event monitor (12/18): no significant arrhythmia.  - Recurrence in 2/19: DCCV to NSR but quickly recurred.  - Admitted in 4/19 for Tikosyn loading and cardioverted to NSR.  - DCCV to NSR in 6/19.  - DCCV to NSR in 12/19. - 7/20 redo atrial flutter/fibrillation ablation.   3. Rheumatic heart disease: #27 Carpentier-Edwards AVR  and #29 Carpentier-Edwards MVR in 2006 for AI and [name].   - Echo 5/11 with EF 60-65%, normal bioprosthetic aortic valve, normal bioprosthetic mitral valve, moderate LAE.   - Echo 11/12 at [facility] looked ok per report.  - Echo (10/13) with EF 55-60%, mild LVH, normal bioprosthetic aortic and mitral valves, moderate LAE, PA systolic pressure 35 mmHg.   - Echo (6/15) with EF 60%, moderate LVH, normal-appearing bioprosthetic MV and AoV, PA systolic pressure 32 mmHg.   - Echo 9/15 [facility]) with EF 55%, mild LVH, PASP 52 mmHg, severe LAE, bioprosthetic mitral valve with no [name] and mean gradient 11 mmHg, moderate TR, bioprosthetic aortic valve with no AI and mean gradient 9 mmHg, dilated ascending aorta to 5 cm.   - Echo (2/16, [facility]) with EF 55%, stable bioprosthetic aortic and mitral valves (AoV mean gradient 7 mmHg, MV mean gradient 4 mmHg), normal RV size and systolic function.  - Echo (12/16, [facility]) with EF 58%, mild RV dilation with low normal systolic function, PA systolic pressure 34 mmHg, bioprosthetic MV and AoV functioning normally.  - Echo (5/18, [facility]) with EF 60-65%, RV normal size and systolic function, moderate LAE, bioprosthetic MV mean gradient 6 with no [name], bioprosthetic aortic valve with mean gradient 11 and no AI, PASP 41 mmHg.  - Echo (2/19): EF 60-65%, moderate LVH, bioprosthetic aortic and mitral valves functioning normally, PASP 23 mmHg, aortic root 4.5 cm.  - Echo (9/19): EF 56%, normal RV size and systolic function, bioprosthetic MV with mean gradient 9 mmHg, trivial [name], bioprosthetic aortic valve with mean gradient 12 mmHg, PASP 51 mmHg.  - Echo (12/19): EF 60-65%, normal bioprosthetic aortic valve, bioprosthetic mitral valve with mean gradient 4 mmHg and no [name], severe LAE, aortic root 4.8 cm.  - Echo (3/20, [facility]): EF 60-65%, normal RV, severe LAE, 5.0 cm ascending  aorta, bioprosthetic MV with mean gradient 7 mmHg,  bioprosthetic aortic valve with mean gradient 9 mmHg.  - Echo (7/20, [facility]): EF 55-60%, moderate septal hypertrophy, normal RV size and systolic function, bioprosthetic MV with trace [name] and mean gradient 4, bioprosthetic aortic valve with mean gradient 14 mmHg, severe LAE.  - Echo (7/21, [facility]): EF 60%, normal RV, s/p bioprosthetic mitral valve with trace [name] and mean gradient 5, s/p bioprosthetic aortic valve with trace AI and mean gradient 15 mmHg, ascending aorta 4.9 cm.  - Echo (10/22): EF 65-70%, normal RV, bioprosthetic AVR mean gradient 16 mmHg appears normal, bioprosthetic MV mean gradient 10, increased from prior but HR also in 100s when study done.   - TEE 10/22 bioprosthetic MV vegetation with trivial [name] and mean gradient 8.  - Echo (1/23): EF 50%, mild-moderate LVH, mildly decreased RV systolic function, bioprosthetic mitral valve appears to have vegetation present with mild [name], mean gradient 8 mmHg (mild-moderate bioprosthetic valve stenosis), bioprosthetic aortic valve with mean gradient 9 mmHg and no AI, the bioprosthetic aortic valve appears thickened, 5.1 cm ascending aorta.  - Echo (10/23): EF 55%, D-shaped septum suggesting RV pressure/volume overload, moderate RVE with moderate RV dysfunction, bioprosthetic mitral valve thickened and appears to have vegetation, mean MV gradient 20 mmHg with MVA 1.1 cm2 suggesting severe bioprosthetic mitral valve stenosis, mild [name]; bioprosthetic aortic valve was thickened with mean gradient 39 and AVA 1.3 cm2, DI 0.26, moderate-severe bioprosthetic AoV stenosis, IVC dilated.  4. Hyperlipidemia 5. L-spine surgery 6. LHC with no significant coronary disease in 2006 pre-op.  - Cardiolite (12/18): No ischemia.  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 - SDH 6/22 with craniotomy followed by MCA CVA due to occluded ICA while off Eliquis (revascularized).  - 10/22: Small left cerebellar infarct possibly embolic from MV  endocarditis. 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  CTA chest 9/15 with dilated sinuses of valsalva to 5.0 cm and ascending aorta 4.9 cm. CTA chest (2/16) with 5.0 cm ascending aorta.  CTA chest (12/16) with 4.9 cm aortic root at SOV, 4.8 cm ascending aorta.  - CT chest (5/18, [facility]): 5.0 cm aortic root, 5.0 cm ascending aorta.  - CTA chest (10/19, [facility]): 5.3 cm aortic root, 5.2 cm ascending aorta.  - CTA chest (3/20, [facility]): 5.0 cm aortic root and ascending aorta - CTA chest (7/20, [facility]): 5.1 cm ascending aorta - CTA chest (7/21, [facility]): 5.1 cm aortic root, 5.0 cm ascending aorta.  9. 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 Subdural hematoma 5/22 with craniotomy 12. 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 Thrombectomy was done.  13. Central sleep apnea  Current Outpatient Medications  Medication Sig Dispense Refill   acetaminophen (TYLENOL) 325 MG tablet Take 650 mg by mouth as needed for mild pain.     Amantadine HCl 100 MG tablet Take 100 mg by mouth 2 (two) times daily.     amoxicillin (AMOXIL) 500 MG capsule Take 500 mg by mouth 2 (two) times daily.     apixaban (ELIQUIS) 5 MG TABS tablet Take 1 tablet (5 mg total) by mouth 2 (two) times daily. 60 tablet 0   Artificial Saliva (BIOTENE MOISTURIZING MOUTH MT) Use as directed 2 sprays in the mouth or throat in the morning, at noon, and at bedtime.     atorvastatin (LIPITOR) 40 MG tablet Take 40 mg by mouth daily.  bisacodyl (DULCOLAX) 10 MG suppository Place 1 suppository (10 mg total) rectally daily as needed for moderate constipation or mild constipation.     brivaracetam (BRIVIACT) 10 MG/ML suspension Take 5 mLs (50 mg total) by mouth 2 (two) times daily. 300 mL 5   clobetasol (TEMOVATE) 0.05 % external solution Apply 1 application  topically as needed (itching).     diazePAM, 15 MG Dose,  (VALTOCO 15 MG DOSE) 2 x 7.5 MG/0.1ML LQPK Place 7.5 mg into each nostril (total of 15 mg) for grand mal seizure lasting over 5 minutes. Can repeat once in 4 hours. Do Not use more than twice in 24 hours 2 each 1   dofetilide (TIKOSYN) 125 MCG capsule Take 3 capsules (375 mcg total) by mouth 2 (two) times daily.     famotidine (PEPCID) 20 MG tablet Take 20 mg by mouth at bedtime as needed for indigestion.     finasteride (PROSCAR) 5 MG tablet Take 1 tablet (5 mg total) by mouth daily. 30 tablet 0   Glucerna (GLUCERNA) LIQD Take 237 mLs by mouth daily as needed.     hydrocortisone 2.5 % cream Apply 1 application  topically daily as needed (itching).     lacosamide (VIMPAT) 10 MG/ML oral solution Take 12.5 mLs (125 mg total) by mouth at bedtime. 50mg  in AM and 125mg  in PM per Dr. Vickey Huger 07/26/22 600 mL 0   magnesium gluconate (MAGONATE) 500 MG tablet Take 1 tablet (500 mg total) by mouth daily. 30 tablet 0   melatonin 5 MG TABS Take 5 mg by mouth at bedtime.     methenamine (MANDELAMINE) 1 g tablet Take 1,000 mg by mouth 2 (two) times daily.     methylphenidate (RITALIN) 5 MG tablet Take 1 tablet (5 mg total) by mouth 2 (two) times daily. Take at 7 am and 12 pm 60 tablet 0   Multiple Vitamin (MULTIVITAMIN WITH MINERALS) TABS tablet Take 1 tablet by mouth daily.     Nebulizers MISC by Does not apply route as needed.     omeprazole (PRILOSEC) 20 MG capsule Take 20 mg by mouth daily.     OXYGEN Inhale into the lungs as directed. Oxygen at 2L [place] - titrate for sat > or = to 90%; May leave on for SOB Every Shift - PRN; PRN 1, PRN 2, PRN 3     Polyethyl Glycol-Propyl Glycol (SYSTANE) 0.4-0.3 % SOLN Place 2 drops into both eyes daily.     polyethylene glycol (MIRALAX / GLYCOLAX) 17 g packet Take 17 g by mouth daily.     potassium chloride (KLOR-CON M) 10 MEQ tablet Take 20 mEq by mouth daily.     senna-docusate (SENOKOT-S) 8.6-50 MG tablet Take 1 tablet by mouth at bedtime as needed.     torsemide  (DEMADEX) 20 MG tablet Take 40 mg by mouth daily. 7:30 am -10 pm  Special Instructions: Extra 20 mg of Torsemide Tuesday and Thursday each week Total of 60 mg Once A Day on Tue, Thu; 08:00 AM - 10:00 AM     UNABLE TO FIND BiPAP  Apply BiPAP @ QHS, Make sure no water or moisture in hose (use spare hose if necessary) Reapply if taken off. Special Instructions: Check hose for moisture/water, if present use spare hose [DX: Obstructive sleep apnea (adult) (pediatric)] At Bedtime; 09:00 PM - 10:00 PM     No current facility-administered medications for this encounter.    Allergies:   Naproxen and No healthtouch food allergies  Social History:  The patient  reports that he has never smoked. He has never used smokeless tobacco. He reports that he does not drink alcohol and does not use drugs.   Family History:  The patient's family history includes Cancer in his father; Diabetes in his brother and paternal aunt; Heart attack in his maternal grandfather; Macular degeneration in his mother.   ROS:  Please see the history of present illness.   All other systems are personally reviewed and negative.   Exam:   BP (!) 90/50   Pulse 64   SpO2 100% Comment: 2 l n/c General: NAD, in wheelchair Neck: No JVD, no thyromegaly or thyroid nodule.  Lungs: Clear to auscultation bilaterally with normal respiratory effort. CV: Nondisplaced PMI.  Heart regular S1/S2, no S3/S4, 2/6 SEM RUSB.  No peripheral edema.  No carotid bruit.  Normal pedal pulses.  Abdomen: Soft, nontender, no hepatosplenomegaly, no distention.  Skin: Intact without lesions or rashes.  Neurologic: Awake, does not talk. Left hemiparesis.  Psych: Normal affect. Extremities: No clubbing or cyanosis.  HEENT: Normal.   Wt Readings from Last 3 Encounters:  09/01/22 59.9 kg (132 lb)  07/07/22 64.7 kg (142 lb 11.2 oz)  06/30/22 64.9 kg (143 lb)    ASSESSMENT AND PLAN 1. Chronic Diastolic Heart Failure: Echo in 10/23 showed EF 55%,  D-shaped interventricular septum, moderate RVE and RV dysfunction, severe bioprosthetic MV stenosis and moderate-severe bioprosthetic AoV stenosis. He is not volume overloaded on exam.    - Continue torsemide 40 mg daily, BMET today.   - Continue KCl 20 mEq daily.  2. 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 Baseline, he is bed-bound, left hemiparesis. Also in 10/22 had new small left cerebellar infarct possibly embolic from endocarditis. He will follow commands today but does not speak.  3. Enterococcal bacteremia: Source uncertain.  No PNA noted on CXR.  Enterococcus did not grow in urine.  Patient had bioprosthetic MV endocarditis by 10/22 TEE, valve function not markedly abnormal (mean gradient elevated at 8 with trivial [name]). He had a new left cerebellar CVA in 10/22 (small) that may have been embolic from MV vegetation.  With prior sternotomy and minimal mobility, he is not a candidate for redo valve surgery.  He completed 6 week course of ceftriaxone/ampicillin.  My review of 1/23 echo showed vegetation still present on mitral valve with mean gradient 8 mmHg and mild [name], and the aortic valve is thickened but not definitive vegetation. Blood cultures in 1/23 were negative, and ESR and CRP were normal. Echo in 10/23 showed vegetation and thickening of MV with thickening also of AoV though no definite mobile vegetation noted.  Repeat blood cultures in 10/23 and 11/23 were negative.  - Continue amoxicillin long-term.  4. 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  Echo in 10/23 showed bioprosthetic AVR to be thickened with moderate-severe stenosis.  Bioprosthetic MV was thickened with vegetation present, severe bioprosthetic MV stenosis.  He is not a candidate for surgery and also would be a poor candidate for percutaneous valve replacement.  Options are quite limited, will be medical management with  diuretics.   - His wife would like him to have a repeat echo.  I will arrange for this at 1 year in 10/24.  5. Atrial fibrillation/Flutter: Maze in 2006 + 2 flutter/fib ablations at [facility]. Remains on decreased Tikosyn 375 mcg bid due to previously  long QT. He is in NSR today with QTc prolonged.  - Continue Tikosyn 375 mcg bid. BMET/Mg today. If K or Mg are low, will supplement and repeat ECG.  If not, will need to lower Tikosyn dose.  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  - Continue Eliquis.  6. Hyperlipidemia: He is on atorvastatin.  7. HTN:  BP has not been elevated.   Followup 6 months. He sees the medical staff at [facility] regularly.   [name] 09/01/2022  [facility] [address] [facility] [place] [address] [phone] (office) [phone] (fax)

## 2022-09-02 ENCOUNTER — Telehealth (HOSPITAL_COMMUNITY): Payer: Self-pay

## 2022-09-02 DIAGNOSIS — S065X3S Traumatic subdural hemorrhage with loss of consciousness of 1 hour to 5 hours 59 minutes, sequela: Secondary | ICD-10-CM | POA: Diagnosis not present

## 2022-09-02 DIAGNOSIS — I5022 Chronic systolic (congestive) heart failure: Secondary | ICD-10-CM | POA: Diagnosis not present

## 2022-09-02 DIAGNOSIS — R41841 Cognitive communication deficit: Secondary | ICD-10-CM | POA: Diagnosis not present

## 2022-09-02 DIAGNOSIS — I69354 Hemiplegia and hemiparesis following cerebral infarction affecting left non-dominant side: Secondary | ICD-10-CM | POA: Diagnosis not present

## 2022-09-02 DIAGNOSIS — R1312 Dysphagia, oropharyngeal phase: Secondary | ICD-10-CM | POA: Diagnosis not present

## 2022-09-02 DIAGNOSIS — G4089 Other seizures: Secondary | ICD-10-CM | POA: Diagnosis not present

## 2022-09-02 MED ORDER — POTASSIUM CHLORIDE CRYS ER 10 MEQ PO TBCR: 40.0000 meq | EXTENDED_RELEASE_TABLET | Freq: Every day | ORAL | Status: DC

## 2022-09-02 NOTE — Telephone Encounter (Addendum)
Spoke with Mrs. Christell Constant and faxed orders to [facility] at [phone]  ----- Message from Laurey Morale, MD sent at 09/01/2022  1:21 PM EDT ----- Increase KCl by an additional 20 mEq daily. Have him get a BMET in 7 days and also an ECG.  Can do ECG at his SNF and have it faxed to me.  Need to follow his QTc with Tikosyn.

## 2022-09-04 DIAGNOSIS — R41841 Cognitive communication deficit: Secondary | ICD-10-CM | POA: Diagnosis not present

## 2022-09-04 DIAGNOSIS — G4089 Other seizures: Secondary | ICD-10-CM | POA: Diagnosis not present

## 2022-09-04 DIAGNOSIS — I69354 Hemiplegia and hemiparesis following cerebral infarction affecting left non-dominant side: Secondary | ICD-10-CM | POA: Diagnosis not present

## 2022-09-04 DIAGNOSIS — R1312 Dysphagia, oropharyngeal phase: Secondary | ICD-10-CM | POA: Diagnosis not present

## 2022-09-04 DIAGNOSIS — S065X3S Traumatic subdural hemorrhage with loss of consciousness of 1 hour to 5 hours 59 minutes, sequela: Secondary | ICD-10-CM | POA: Diagnosis not present

## 2022-09-04 DIAGNOSIS — I5022 Chronic systolic (congestive) heart failure: Secondary | ICD-10-CM | POA: Diagnosis not present

## 2022-09-05 ENCOUNTER — Non-Acute Institutional Stay (SKILLED_NURSING_FACILITY): Payer: Medicare Other | Admitting: Internal Medicine

## 2022-09-05 ENCOUNTER — Encounter: Payer: Self-pay | Admitting: Internal Medicine

## 2022-09-05 DIAGNOSIS — I480 Paroxysmal atrial fibrillation: Secondary | ICD-10-CM

## 2022-09-05 DIAGNOSIS — R634 Abnormal weight loss: Secondary | ICD-10-CM | POA: Diagnosis not present

## 2022-09-05 DIAGNOSIS — I5022 Chronic systolic (congestive) heart failure: Secondary | ICD-10-CM

## 2022-09-05 DIAGNOSIS — G4089 Other seizures: Secondary | ICD-10-CM | POA: Diagnosis not present

## 2022-09-05 DIAGNOSIS — I10 Essential (primary) hypertension: Secondary | ICD-10-CM | POA: Diagnosis not present

## 2022-09-05 DIAGNOSIS — I69812 Visuospatial deficit and spatial neglect following other cerebrovascular disease: Secondary | ICD-10-CM | POA: Diagnosis not present

## 2022-09-05 DIAGNOSIS — S065X3S Traumatic subdural hemorrhage with loss of consciousness of 1 hour to 5 hours 59 minutes, sequela: Secondary | ICD-10-CM | POA: Diagnosis not present

## 2022-09-05 DIAGNOSIS — N390 Urinary tract infection, site not specified: Secondary | ICD-10-CM

## 2022-09-05 DIAGNOSIS — R41840 Attention and concentration deficit: Secondary | ICD-10-CM | POA: Diagnosis not present

## 2022-09-05 DIAGNOSIS — I69398 Other sequelae of cerebral infarction: Secondary | ICD-10-CM | POA: Diagnosis not present

## 2022-09-05 DIAGNOSIS — R569 Unspecified convulsions: Secondary | ICD-10-CM | POA: Diagnosis not present

## 2022-09-05 DIAGNOSIS — R41841 Cognitive communication deficit: Secondary | ICD-10-CM | POA: Diagnosis not present

## 2022-09-05 DIAGNOSIS — T826XXD Infection and inflammatory reaction due to cardiac valve prosthesis, subsequent encounter: Secondary | ICD-10-CM

## 2022-09-05 DIAGNOSIS — E785 Hyperlipidemia, unspecified: Secondary | ICD-10-CM

## 2022-09-05 DIAGNOSIS — R1312 Dysphagia, oropharyngeal phase: Secondary | ICD-10-CM | POA: Diagnosis not present

## 2022-09-05 DIAGNOSIS — I69354 Hemiplegia and hemiparesis following cerebral infarction affecting left non-dominant side: Secondary | ICD-10-CM | POA: Diagnosis not present

## 2022-09-05 DIAGNOSIS — N400 Enlarged prostate without lower urinary tract symptoms: Secondary | ICD-10-CM | POA: Diagnosis not present

## 2022-09-05 DIAGNOSIS — M6389 Disorders of muscle in diseases classified elsewhere, multiple sites: Secondary | ICD-10-CM | POA: Diagnosis not present

## 2022-09-05 DIAGNOSIS — R278 Other lack of coordination: Secondary | ICD-10-CM | POA: Diagnosis not present

## 2022-09-05 DIAGNOSIS — I38 Endocarditis, valve unspecified: Secondary | ICD-10-CM

## 2022-09-05 DIAGNOSIS — E870 Hyperosmolality and hypernatremia: Secondary | ICD-10-CM | POA: Diagnosis not present

## 2022-09-05 DIAGNOSIS — H538 Other visual disturbances: Secondary | ICD-10-CM | POA: Diagnosis not present

## 2022-09-05 DIAGNOSIS — R293 Abnormal posture: Secondary | ICD-10-CM | POA: Diagnosis not present

## 2022-09-05 DIAGNOSIS — G4737 Central sleep apnea in conditions classified elsewhere: Secondary | ICD-10-CM

## 2022-09-05 NOTE — Progress Notes (Unsigned)
Location:   [facility] Room Number: 127A Place of Service:  SNF [phone]) Provider:  Einar Crow, MD  Mahlon Gammon, MD  Patient Care Team: Mahlon Gammon, MD as PCP - General (Internal Medicine) Laurey Morale, MD as PCP - Cardiology (Cardiology) Mia Creek, MD as Consulting Physician (Ophthalmology) Etta Grandchild, MD (Internal Medicine)  Extended Emergency Contact Information Primary Emergency Contact: Kae Heller Address: [address]          [place], [place] [address] [place] Home Phone: [phone] Mobile Phone: [phone] Relation: Spouse Secondary Emergency Contact: Phineas Inches, [place] [address] [place] Home Phone: [phone] Relation: Son  Code Status:  DNR Goals of care: Advanced Directive information    09/05/2022   11:32 AM  Advanced Directives  Does Patient Have a Medical Advance Directive? Yes  Type of Estate agent of [place];Living will;Out of facility DNR (pink MOST or yellow form)  Does patient want to make changes to medical advance directive? No - Patient declined  Copy of Healthcare Power of Attorney in Chart? Yes - validated most recent copy scanned in chart (See row information)     Chief Complaint  Patient presents with   Medical Management of Chronic Issues    Routine follow up    Immunizations    Discuss need for another COVID booster   Quality Metric Gaps    Medicare Annual wellness due    HPI:  Pt is a 82 y.o. male seen today for medical management of chronic diseases.    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  His extra doses of torsemide were discontinued few weeks ago. Patient also had a wide QRS complex on EKG by Dr. Shirlee Latch His potassium was low and he is on extra doses of potassium now.  Repeat EKG is pending Patient continues to also decline neurologically.   sleeping more less responsive and also talks less. 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   Lesion of ulnar nerve    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    Other and unspecified hyperlipidemia    Pre-diabetes    Previous back surgery 1978, jan 2007   Psychosexual dysfunction  with inhibited sexual excitement    SOB (shortness of breath)    "with heavy exercise"   Stroke (HCC) 08/2013   "I WAS IN AFIB AND THREW A CLOT, THE EFFECTS WERE TRANSITORY AND DIDNT LAST BUT FOR 30 MINUTES"    Thoracic aortic aneurysm [facility])    Past Surgical History:  Procedure Laterality Date   AORTIC AND MITRAL VALVE REPLACEMENT     09/2004   CARDIOVERSION     3 times from 2004-2006   CARDIOVERSION N/A 09/26/2013   Procedure: CARDIOVERSION;  Surgeon: Laurey Morale, MD;  Location: [facility] ENDOSCOPY;  Service: Cardiovascular;  Laterality: N/A;   CARDIOVERSION  N/A 06/19/2014   Procedure: CARDIOVERSION;  Surgeon: Jake Bathe, MD;  Location: [facility] ENDOSCOPY;  Service: Cardiovascular;  Laterality: N/A;   CARDIOVERSION N/A 04/24/2017   Procedure: CARDIOVERSION;  Surgeon: Laurey Morale, MD;  Location: [facility] ENDOSCOPY;  Service: Cardiovascular;  Laterality: N/A;   CARDIOVERSION N/A 06/08/2017   Procedure: CARDIOVERSION;  Surgeon: Chrystie Nose, MD;  Location: [facility] ENDOSCOPY;  Service: Cardiovascular;  Laterality: N/A;   CARDIOVERSION N/A 08/24/2017   Procedure: CARDIOVERSION;  Surgeon: Chilton Si, MD;  Location: [facility] ENDOSCOPY;  Service: Cardiovascular;  Laterality: N/A;   CARDIOVERSION N/A 02/14/2018   Procedure: CARDIOVERSION;  Surgeon: Laurey Morale, MD;  Location: [facility] ENDOSCOPY;  Service: Cardiovascular;  Laterality: N/A;   COLONOSCOPY     CRANIOTOMY N/A 07/28/2020   Procedure: CRANIOTOMY FOR EVACUATION OF SUBDURAL HEMATOMA;  Surgeon: Tia Alert, MD;  Location: [facility] OR;  Service: Neurosurgery;  Laterality: N/A;   IR ANGIO VERTEBRAL SEL SUBCLAVIAN INNOMINATE UNI R MOD SED  08/07/2020   IR CT HEAD LTD  08/07/2020   IR PERCUTANEOUS ART THROMBECTOMY/INFUSION INTRACRANIAL INC DIAG ANGIO  08/07/2020   laminectomies     10/1975 and in 03/2005   RADIOLOGY WITH ANESTHESIA N/A 08/07/2020   Procedure: IR WITH ANESTHESIA;  Surgeon: Julieanne Cotton, MD;  Location: [facility] OR;  Service: Radiology;  Laterality: N/A;   TEE WITHOUT CARDIOVERSION N/A 12/18/2020   Procedure: TRANSESOPHAGEAL ECHOCARDIOGRAM (TEE);  Surgeon: Laurey Morale, MD;  Location: [facility] ENDOSCOPY;  Service: Cardiovascular;  Laterality: N/A;   TONSILLECTOMY     1950   TOTAL HIP ARTHROPLASTY Right 04/03/2018   Procedure: TOTAL HIP ARTHROPLASTY ANTERIOR APPROACH;  Surgeon: Durene Romans, MD;  Location: [facility] ORS;  Service: Orthopedics;  Laterality: Right;  70 mins    Allergies  Allergen Reactions   Naproxen Hives   No [facility] Food Allergies Other (See Comments)    Scallops - distress, nausea and  vomitting    Allergies as of 09/05/2022       Reactions   Naproxen Hives   No [facility] Food Allergies Other (See Comments)   Scallops - distress, nausea and vomitting        Medication List        Accurate as of September 05, 2022 11:47 AM. If you have any questions, ask your nurse or doctor.          acetaminophen 325 MG tablet Commonly known as: TYLENOL Take 650 mg by mouth as needed for mild pain.   Amantadine HCl 100 MG tablet Take 100 mg by mouth 2 (two) times daily.   amoxicillin 500 MG capsule Commonly known as: AMOXIL Take 500 mg by mouth 2 (two) times daily.   apixaban 5 MG Tabs tablet Commonly known as: ELIQUIS Take 1 tablet (5 mg total) by mouth 2 (two) times daily.   atorvastatin 40 MG  tablet Commonly known as: LIPITOR Take 40 mg by mouth daily.   BIOTENE MOISTURIZING MOUTH MT Use as directed 2 sprays in the mouth or throat in the morning, at noon, and at bedtime.   bisacodyl 10 MG suppository Commonly known as: DULCOLAX Place 1 suppository (10 mg total) rectally daily as needed for moderate constipation or mild constipation.   brivaracetam 10 MG/ML solution Commonly known as: Briviact Take 5 mLs (50 mg total) by mouth 2 (two) times daily.   clobetasol 0.05 % external solution Commonly known as: TEMOVATE Apply 1 application  topically as needed (itching).   dofetilide 125 MCG capsule Commonly known as: TIKOSYN Take 3 capsules (375 mcg total) by mouth 2 (two) times daily.   famotidine 20 MG tablet Commonly known as: PEPCID Take 20 mg by mouth at bedtime as needed for indigestion.   finasteride 5 MG tablet Commonly known as: PROSCAR Take 1 tablet (5 mg total) by mouth daily.   Glucerna Liqd Take 237 mLs by mouth daily as needed.   hydrocortisone 2.5 % cream Apply 1 application  topically daily as needed (itching).   lacosamide 10 MG/ML oral solution Commonly known as: VIMPAT Take 50 mg by mouth 2 (two) times daily. What changed:  Another medication with the same name was removed. Continue taking this medication, and follow the directions you see here. Changed by: Mahlon Gammon, MD   magnesium gluconate 500 MG tablet Commonly known as: MAGONATE Take 1 tablet (500 mg total) by mouth daily.   melatonin 5 MG Tabs Take 5 mg by mouth at bedtime.   methenamine 1 g tablet Commonly known as: MANDELAMINE Take 1,000 mg by mouth 2 (two) times daily.   methylphenidate 5 MG tablet Commonly known as: RITALIN Take 1 tablet (5 mg total) by mouth 2 (two) times daily. Take at 7 am and 12 pm   multivitamin with minerals Tabs tablet Take 1 tablet by mouth daily.   Nebulizers Misc by Does not apply route as needed.   omeprazole 20 MG capsule Commonly known as: PRILOSEC Take 20 mg by mouth daily.   OXYGEN Inhale into the lungs as directed. Oxygen at 2L [place] - titrate for sat > or = to 90%; May leave on for SOB Every Shift - PRN; PRN 1, PRN 2, PRN 3   polyethylene glycol 17 g packet Commonly known as: MIRALAX / GLYCOLAX Take 17 g by mouth daily.   potassium chloride 10 MEQ tablet Commonly known as: KLOR-CON M Take 20 mEq by mouth 2 (two) times daily. What changed: Another medication with the same name was removed. Continue taking this medication, and follow the directions you see here. Changed by: Mahlon Gammon, MD   senna-docusate 8.6-50 MG tablet Commonly known as: Senokot-S Take 1 tablet by mouth at bedtime as needed.   Systane 0.4-0.3 % Soln Generic drug: Polyethyl Glycol-Propyl Glycol Place 2 drops into both eyes daily.   torsemide 20 MG tablet Commonly known as: DEMADEX Take 40 mg by mouth daily.   UNABLE TO FIND BiPAP  Apply BiPAP @ QHS, Make sure no water or moisture in hose (use spare hose if necessary) Reapply if taken off. Special Instructions: Check hose for moisture/water, if present use spare hose [DX: Obstructive sleep apnea (adult) (pediatric)] At Bedtime; 09:00 PM - 10:00 PM   Valtoco  15 MG Dose 2 x 7.5 MG/0.1ML Lqpk Generic drug: diazePAM (15 MG Dose) Place 7.5 mg into each nostril (total of 15 mg) for grand mal  seizure lasting over 5 minutes. Can repeat once in 4 hours. Do Not use more than twice in 24 hours        Review of Systems  Unable to perform ROS: Dementia    Immunization History  Administered Date(s) Administered   Fluad Quad(high Dose 65+) 12/06/2018, 12/09/2020, 12/10/2021   Influenza Whole 12/25/2008, 01/05/2010, 12/30/2011   Influenza, High Dose Seasonal PF 12/14/2016   Influenza,inj,Quad PF,6+ Mos 12/19/2013   Influenza,inj,quad, With Preservative 11/22/2017   Influenza-Unspecified 12/14/2015, 12/24/2019, 12/10/2021   Moderna Covid-19 Vaccine Bivalent Booster 20yrs & up 01/12/2021   Moderna SARS-COV2 Booster Vaccination 01/02/2020, 06/26/2020   Moderna Sars-Covid-2 Vaccination 04/08/2019, 05/05/2019, 01/10/2022   Pneumococcal Conjugate-13 12/19/2013   Pneumococcal Polysaccharide-23 09/24/2007, 05/22/2017   Rsv, Bivalent, Protein Subunit Rsvpref,pf Verdis Frederickson) 02/14/2022   Td 12/25/2008   Td (Adult), 2 Lf Tetanus Toxid, Preservative Free 12/25/2008   Tdap 03/22/2018   Zoster Recombinant(Shingrix) 07/16/2019, 10/28/2019   Zoster, Live 09/24/2007   Pertinent  Health Maintenance Due  Topic Date Due   OPHTHALMOLOGY EXAM  10/06/2022 (Originally 10/04/1950)   INFLUENZA VACCINE  10/06/2022   FOOT EXAM  10/23/2022   HEMOGLOBIN A1C  01/04/2023   Colonoscopy  Discontinued      08/11/2021    7:30 PM 08/12/2021    8:00 AM 10/13/2021    2:32 PM 02/02/2022   11:24 AM 04/20/2022    3:01 PM  Fall Risk  Falls in the past year?   0 0 0  Was there an injury with Fall?    0 0  Fall Risk Category Calculator    0 0  Fall Risk Category (Retired)    Low   (RETIRED) Patient Fall Risk Level High fall risk High fall risk  High fall risk   Patient at Risk for Falls Due to    Impaired mobility   Fall risk Follow up    Falls evaluation completed    Functional  Status Survey:    Vitals:   09/05/22 1129  BP: 112/78  Pulse: (!) 55  Resp: 19  Temp: (!) 97 F (36.1 C)  SpO2: 99%  Weight: 137 lb 3.2 oz (62.2 kg)  Height: 5\' 7"  (1.702 m)   Body mass index is 21.49 kg/m. Physical Exam Vitals reviewed.  Constitutional:      Appearance: Normal appearance.  HENT:     Head: Normocephalic.     Nose: Nose normal.     Mouth/Throat:     Mouth: Mucous membranes are moist.     Pharynx: Oropharynx is clear.  Eyes:     Pupils: Pupils are equal, round, and reactive to light.  Cardiovascular:     Rate and Rhythm: Normal rate and regular rhythm.     Pulses: Normal pulses.     Heart sounds: Murmur heard.  Pulmonary:     Effort: Pulmonary effort is normal. No respiratory distress.     Breath sounds: Normal breath sounds. No rales.     Comments: Rales in Left Lower Lung Abdominal:     General: Abdomen is flat. Bowel sounds are normal.     Palpations: Abdomen is soft.  Musculoskeletal:        General: No swelling.     Cervical back: Neck supple.  Skin:    General: Skin is warm.  Neurological:     Mental Status: He is alert.     Comments: Was more sleepy today Does not follow Commands  Psychiatric:        Mood and  Affect: Mood normal.        Thought Content: Thought content normal.     Labs reviewed: Recent Labs    12/13/21 1157 12/14/21 0000 02/02/22 1202 03/07/22 0000 08/02/22 0000 08/10/22 1102 09/01/22 1123  NA  --    < > 145   < > 145 147* 144  K  --    < > 3.6   < > 3.8 3.8 3.4*  CL  --    < > 107   < > 108 107 108  CO2  --    < > 28   < > 23* 25 26  GLUCOSE  --    < > 126*  --   --  136* 158*  BUN  --    < > 32*   < > 30* 31* 29*  CREATININE  --    < > 1.14   < > 1.1 1.10 1.06  CALCIUM  --    < > 9.2   < > 9.1 9.4 9.0  MG 2.4  --   --   --   --   --  2.5*   < > = values in this interval not displayed.   Recent Labs    09/08/21 0000 01/03/22 1322 02/02/22 1202 06/07/22 0000 08/10/22 1102  AST 24 25 22  33 34  ALT  18 20 23  42* 54*  ALKPHOS 105 100  --  107  --   BILITOT  --  1.1 0.9  --  1.0  PROT  --  7.4 7.1  --  7.3  ALBUMIN 3.5 3.6  --  4.2  --    Recent Labs    02/02/22 1202 03/14/22 0000 08/10/22 1102 09/01/22 1123  WBC 9.4 8.1 9.0 9.3  NEUTROABS 6,110  --  6,759  --   HGB 9.0* 10.3* 12.2* 11.7*  HCT 28.3* 32* 37.1* 36.7*  MCV 86.0  --  93.0 96.1  PLT 261 240 220 171   Lab Results  Component Value Date   TSH 1.74 07/05/2022   Lab Results  Component Value Date   HGBA1C 6.3 07/05/2022   Lab Results  Component Value Date   CHOL 98 06/14/2022   HDL 32 (A) 06/14/2022   LDLCALC 54 06/14/2022   TRIG 57 06/14/2022   CHOLHDL 2.8 08/08/2020    Significant Diagnostic Results in last 30 days:  No results found.  Assessment/Plan 1. Seizure (HCC) Change Vimpat to 75 mg  On Briviact  2. Weight loss Eating less Will decrase the toresimide to 30 mg  3. Chronic systolic CHF (congestive heart failure) (HCC) Decrae toresimide  4. PAF (paroxysmal atrial fibrillation) (HCC) On Eliquis and Tikosyn [place]iden QRS per cardiology EKG notced Repeat EKG pending Potassium corrected 5.   6. Hyperlipidemia with target LDL less than 130 Statin LDL 54 in 4/24  7. Essential hypertension ***  8. Prosthetic valve endocarditis, subsequent encounter ***  9. Hypernatremia ***  10. 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 Benign prostatic hyperplasia without lower urinary tract symptoms ***  12. Recurrent UTI ***    Family/ staff Communication:   Labs/tests ordered:

## 2022-09-06 DIAGNOSIS — I69354 Hemiplegia and hemiparesis following cerebral infarction affecting left non-dominant side: Secondary | ICD-10-CM | POA: Diagnosis not present

## 2022-09-06 DIAGNOSIS — R1312 Dysphagia, oropharyngeal phase: Secondary | ICD-10-CM | POA: Diagnosis not present

## 2022-09-06 DIAGNOSIS — G4089 Other seizures: Secondary | ICD-10-CM | POA: Diagnosis not present

## 2022-09-06 DIAGNOSIS — I38 Endocarditis, valve unspecified: Secondary | ICD-10-CM | POA: Diagnosis not present

## 2022-09-06 DIAGNOSIS — I5022 Chronic systolic (congestive) heart failure: Secondary | ICD-10-CM | POA: Diagnosis not present

## 2022-09-06 DIAGNOSIS — R41841 Cognitive communication deficit: Secondary | ICD-10-CM | POA: Diagnosis not present

## 2022-09-06 DIAGNOSIS — S065X3S Traumatic subdural hemorrhage with loss of consciousness of 1 hour to 5 hours 59 minutes, sequela: Secondary | ICD-10-CM | POA: Diagnosis not present

## 2022-09-07 DIAGNOSIS — S065X3S Traumatic subdural hemorrhage with loss of consciousness of 1 hour to 5 hours 59 minutes, sequela: Secondary | ICD-10-CM | POA: Diagnosis not present

## 2022-09-07 DIAGNOSIS — G4089 Other seizures: Secondary | ICD-10-CM | POA: Diagnosis not present

## 2022-09-07 DIAGNOSIS — I5022 Chronic systolic (congestive) heart failure: Secondary | ICD-10-CM | POA: Diagnosis not present

## 2022-09-07 DIAGNOSIS — I69354 Hemiplegia and hemiparesis following cerebral infarction affecting left non-dominant side: Secondary | ICD-10-CM | POA: Diagnosis not present

## 2022-09-07 DIAGNOSIS — R41841 Cognitive communication deficit: Secondary | ICD-10-CM | POA: Diagnosis not present

## 2022-09-07 DIAGNOSIS — R1312 Dysphagia, oropharyngeal phase: Secondary | ICD-10-CM | POA: Diagnosis not present

## 2022-09-08 DIAGNOSIS — R41841 Cognitive communication deficit: Secondary | ICD-10-CM | POA: Diagnosis not present

## 2022-09-08 DIAGNOSIS — S065X3S Traumatic subdural hemorrhage with loss of consciousness of 1 hour to 5 hours 59 minutes, sequela: Secondary | ICD-10-CM | POA: Diagnosis not present

## 2022-09-08 DIAGNOSIS — R1312 Dysphagia, oropharyngeal phase: Secondary | ICD-10-CM | POA: Diagnosis not present

## 2022-09-08 DIAGNOSIS — G4089 Other seizures: Secondary | ICD-10-CM | POA: Diagnosis not present

## 2022-09-08 DIAGNOSIS — I5022 Chronic systolic (congestive) heart failure: Secondary | ICD-10-CM | POA: Diagnosis not present

## 2022-09-08 DIAGNOSIS — I69354 Hemiplegia and hemiparesis following cerebral infarction affecting left non-dominant side: Secondary | ICD-10-CM | POA: Diagnosis not present

## 2022-09-09 DIAGNOSIS — S065X3S Traumatic subdural hemorrhage with loss of consciousness of 1 hour to 5 hours 59 minutes, sequela: Secondary | ICD-10-CM | POA: Diagnosis not present

## 2022-09-09 DIAGNOSIS — R41841 Cognitive communication deficit: Secondary | ICD-10-CM | POA: Diagnosis not present

## 2022-09-09 DIAGNOSIS — I10 Essential (primary) hypertension: Secondary | ICD-10-CM | POA: Diagnosis not present

## 2022-09-09 DIAGNOSIS — G4089 Other seizures: Secondary | ICD-10-CM | POA: Diagnosis not present

## 2022-09-09 DIAGNOSIS — I5022 Chronic systolic (congestive) heart failure: Secondary | ICD-10-CM | POA: Diagnosis not present

## 2022-09-09 DIAGNOSIS — R1312 Dysphagia, oropharyngeal phase: Secondary | ICD-10-CM | POA: Diagnosis not present

## 2022-09-09 DIAGNOSIS — I69354 Hemiplegia and hemiparesis following cerebral infarction affecting left non-dominant side: Secondary | ICD-10-CM | POA: Diagnosis not present

## 2022-09-12 ENCOUNTER — Encounter: Payer: Self-pay | Admitting: Neurology

## 2022-09-12 DIAGNOSIS — S065X3S Traumatic subdural hemorrhage with loss of consciousness of 1 hour to 5 hours 59 minutes, sequela: Secondary | ICD-10-CM | POA: Diagnosis not present

## 2022-09-12 DIAGNOSIS — I5022 Chronic systolic (congestive) heart failure: Secondary | ICD-10-CM | POA: Diagnosis not present

## 2022-09-12 DIAGNOSIS — G4089 Other seizures: Secondary | ICD-10-CM | POA: Diagnosis not present

## 2022-09-12 DIAGNOSIS — I69354 Hemiplegia and hemiparesis following cerebral infarction affecting left non-dominant side: Secondary | ICD-10-CM | POA: Diagnosis not present

## 2022-09-12 DIAGNOSIS — R41841 Cognitive communication deficit: Secondary | ICD-10-CM | POA: Diagnosis not present

## 2022-09-12 DIAGNOSIS — R1312 Dysphagia, oropharyngeal phase: Secondary | ICD-10-CM | POA: Diagnosis not present

## 2022-09-13 DIAGNOSIS — I5022 Chronic systolic (congestive) heart failure: Secondary | ICD-10-CM | POA: Diagnosis not present

## 2022-09-14 DIAGNOSIS — S065X3S Traumatic subdural hemorrhage with loss of consciousness of 1 hour to 5 hours 59 minutes, sequela: Secondary | ICD-10-CM | POA: Diagnosis not present

## 2022-09-14 DIAGNOSIS — I69354 Hemiplegia and hemiparesis following cerebral infarction affecting left non-dominant side: Secondary | ICD-10-CM | POA: Diagnosis not present

## 2022-09-14 DIAGNOSIS — R1312 Dysphagia, oropharyngeal phase: Secondary | ICD-10-CM | POA: Diagnosis not present

## 2022-09-14 DIAGNOSIS — G4089 Other seizures: Secondary | ICD-10-CM | POA: Diagnosis not present

## 2022-09-14 DIAGNOSIS — I5022 Chronic systolic (congestive) heart failure: Secondary | ICD-10-CM | POA: Diagnosis not present

## 2022-09-14 DIAGNOSIS — R41841 Cognitive communication deficit: Secondary | ICD-10-CM | POA: Diagnosis not present

## 2022-09-16 DIAGNOSIS — S065X3S Traumatic subdural hemorrhage with loss of consciousness of 1 hour to 5 hours 59 minutes, sequela: Secondary | ICD-10-CM | POA: Diagnosis not present

## 2022-09-16 DIAGNOSIS — I5022 Chronic systolic (congestive) heart failure: Secondary | ICD-10-CM | POA: Diagnosis not present

## 2022-09-16 DIAGNOSIS — R41841 Cognitive communication deficit: Secondary | ICD-10-CM | POA: Diagnosis not present

## 2022-09-16 DIAGNOSIS — R1312 Dysphagia, oropharyngeal phase: Secondary | ICD-10-CM | POA: Diagnosis not present

## 2022-09-16 DIAGNOSIS — G4089 Other seizures: Secondary | ICD-10-CM | POA: Diagnosis not present

## 2022-09-16 DIAGNOSIS — I69354 Hemiplegia and hemiparesis following cerebral infarction affecting left non-dominant side: Secondary | ICD-10-CM | POA: Diagnosis not present

## 2022-09-19 ENCOUNTER — Other Ambulatory Visit: Payer: Self-pay | Admitting: Internal Medicine

## 2022-09-19 DIAGNOSIS — I5022 Chronic systolic (congestive) heart failure: Secondary | ICD-10-CM | POA: Diagnosis not present

## 2022-09-19 DIAGNOSIS — G4089 Other seizures: Secondary | ICD-10-CM | POA: Diagnosis not present

## 2022-09-19 DIAGNOSIS — R1312 Dysphagia, oropharyngeal phase: Secondary | ICD-10-CM | POA: Diagnosis not present

## 2022-09-19 DIAGNOSIS — R41841 Cognitive communication deficit: Secondary | ICD-10-CM | POA: Diagnosis not present

## 2022-09-19 DIAGNOSIS — S065X3S Traumatic subdural hemorrhage with loss of consciousness of 1 hour to 5 hours 59 minutes, sequela: Secondary | ICD-10-CM | POA: Diagnosis not present

## 2022-09-19 DIAGNOSIS — I69354 Hemiplegia and hemiparesis following cerebral infarction affecting left non-dominant side: Secondary | ICD-10-CM | POA: Diagnosis not present

## 2022-09-19 MED ORDER — METHYLPHENIDATE HCL 5 MG PO TABS: 5.0000 mg | ORAL_TABLET | Freq: Two times a day (BID) | ORAL | 0 refills | Status: DC

## 2022-09-20 DIAGNOSIS — I10 Essential (primary) hypertension: Secondary | ICD-10-CM | POA: Diagnosis not present

## 2022-09-21 DIAGNOSIS — S065X3S Traumatic subdural hemorrhage with loss of consciousness of 1 hour to 5 hours 59 minutes, sequela: Secondary | ICD-10-CM | POA: Diagnosis not present

## 2022-09-21 DIAGNOSIS — R1312 Dysphagia, oropharyngeal phase: Secondary | ICD-10-CM | POA: Diagnosis not present

## 2022-09-21 DIAGNOSIS — G4089 Other seizures: Secondary | ICD-10-CM | POA: Diagnosis not present

## 2022-09-21 DIAGNOSIS — I69354 Hemiplegia and hemiparesis following cerebral infarction affecting left non-dominant side: Secondary | ICD-10-CM | POA: Diagnosis not present

## 2022-09-21 DIAGNOSIS — I5022 Chronic systolic (congestive) heart failure: Secondary | ICD-10-CM | POA: Diagnosis not present

## 2022-09-21 DIAGNOSIS — R41841 Cognitive communication deficit: Secondary | ICD-10-CM | POA: Diagnosis not present

## 2022-09-23 DIAGNOSIS — R41841 Cognitive communication deficit: Secondary | ICD-10-CM | POA: Diagnosis not present

## 2022-09-23 DIAGNOSIS — I5022 Chronic systolic (congestive) heart failure: Secondary | ICD-10-CM | POA: Diagnosis not present

## 2022-09-23 DIAGNOSIS — R1312 Dysphagia, oropharyngeal phase: Secondary | ICD-10-CM | POA: Diagnosis not present

## 2022-09-23 DIAGNOSIS — S065X3S Traumatic subdural hemorrhage with loss of consciousness of 1 hour to 5 hours 59 minutes, sequela: Secondary | ICD-10-CM | POA: Diagnosis not present

## 2022-09-23 DIAGNOSIS — I69354 Hemiplegia and hemiparesis following cerebral infarction affecting left non-dominant side: Secondary | ICD-10-CM | POA: Diagnosis not present

## 2022-09-23 DIAGNOSIS — G4089 Other seizures: Secondary | ICD-10-CM | POA: Diagnosis not present

## 2022-09-25 DIAGNOSIS — G4089 Other seizures: Secondary | ICD-10-CM | POA: Diagnosis not present

## 2022-09-25 DIAGNOSIS — I5022 Chronic systolic (congestive) heart failure: Secondary | ICD-10-CM | POA: Diagnosis not present

## 2022-09-25 DIAGNOSIS — S065X3S Traumatic subdural hemorrhage with loss of consciousness of 1 hour to 5 hours 59 minutes, sequela: Secondary | ICD-10-CM | POA: Diagnosis not present

## 2022-09-25 DIAGNOSIS — R41841 Cognitive communication deficit: Secondary | ICD-10-CM | POA: Diagnosis not present

## 2022-09-25 DIAGNOSIS — R1312 Dysphagia, oropharyngeal phase: Secondary | ICD-10-CM | POA: Diagnosis not present

## 2022-09-25 DIAGNOSIS — I69354 Hemiplegia and hemiparesis following cerebral infarction affecting left non-dominant side: Secondary | ICD-10-CM | POA: Diagnosis not present

## 2022-09-26 ENCOUNTER — Other Ambulatory Visit (HOSPITAL_COMMUNITY): Payer: Self-pay | Admitting: Cardiology

## 2022-09-26 ENCOUNTER — Other Ambulatory Visit: Payer: Self-pay | Admitting: Internal Medicine

## 2022-09-26 DIAGNOSIS — R1312 Dysphagia, oropharyngeal phase: Secondary | ICD-10-CM | POA: Diagnosis not present

## 2022-09-26 DIAGNOSIS — I69354 Hemiplegia and hemiparesis following cerebral infarction affecting left non-dominant side: Secondary | ICD-10-CM | POA: Diagnosis not present

## 2022-09-26 DIAGNOSIS — I5022 Chronic systolic (congestive) heart failure: Secondary | ICD-10-CM | POA: Diagnosis not present

## 2022-09-26 DIAGNOSIS — R41841 Cognitive communication deficit: Secondary | ICD-10-CM | POA: Diagnosis not present

## 2022-09-26 DIAGNOSIS — G4089 Other seizures: Secondary | ICD-10-CM | POA: Diagnosis not present

## 2022-09-26 DIAGNOSIS — S065X3S Traumatic subdural hemorrhage with loss of consciousness of 1 hour to 5 hours 59 minutes, sequela: Secondary | ICD-10-CM | POA: Diagnosis not present

## 2022-09-26 MED ORDER — BRIVARACETAM 10 MG/ML PO SOLN: 50.0000 mg | Freq: Two times a day (BID) | ORAL | 5 refills | Status: DC

## 2022-09-27 DIAGNOSIS — I10 Essential (primary) hypertension: Secondary | ICD-10-CM | POA: Diagnosis not present

## 2022-09-28 DIAGNOSIS — I5022 Chronic systolic (congestive) heart failure: Secondary | ICD-10-CM | POA: Diagnosis not present

## 2022-09-28 DIAGNOSIS — G4089 Other seizures: Secondary | ICD-10-CM | POA: Diagnosis not present

## 2022-09-28 DIAGNOSIS — R41841 Cognitive communication deficit: Secondary | ICD-10-CM | POA: Diagnosis not present

## 2022-09-28 DIAGNOSIS — I69354 Hemiplegia and hemiparesis following cerebral infarction affecting left non-dominant side: Secondary | ICD-10-CM | POA: Diagnosis not present

## 2022-09-28 DIAGNOSIS — R1312 Dysphagia, oropharyngeal phase: Secondary | ICD-10-CM | POA: Diagnosis not present

## 2022-09-28 DIAGNOSIS — S065X3S Traumatic subdural hemorrhage with loss of consciousness of 1 hour to 5 hours 59 minutes, sequela: Secondary | ICD-10-CM | POA: Diagnosis not present

## 2022-09-30 ENCOUNTER — Other Ambulatory Visit: Payer: Self-pay | Admitting: Adult Health

## 2022-09-30 DIAGNOSIS — G4089 Other seizures: Secondary | ICD-10-CM | POA: Diagnosis not present

## 2022-09-30 DIAGNOSIS — R1312 Dysphagia, oropharyngeal phase: Secondary | ICD-10-CM | POA: Diagnosis not present

## 2022-09-30 DIAGNOSIS — S065X3S Traumatic subdural hemorrhage with loss of consciousness of 1 hour to 5 hours 59 minutes, sequela: Secondary | ICD-10-CM | POA: Diagnosis not present

## 2022-09-30 DIAGNOSIS — R41841 Cognitive communication deficit: Secondary | ICD-10-CM | POA: Diagnosis not present

## 2022-09-30 DIAGNOSIS — I69354 Hemiplegia and hemiparesis following cerebral infarction affecting left non-dominant side: Secondary | ICD-10-CM | POA: Diagnosis not present

## 2022-09-30 DIAGNOSIS — I5022 Chronic systolic (congestive) heart failure: Secondary | ICD-10-CM | POA: Diagnosis not present

## 2022-09-30 MED ORDER — BRIVARACETAM 10 MG/ML PO SOLN: 50.0000 mg | Freq: Two times a day (BID) | ORAL | 5 refills | Status: DC

## 2022-10-03 ENCOUNTER — Encounter: Payer: Self-pay | Admitting: Internal Medicine

## 2022-10-03 ENCOUNTER — Non-Acute Institutional Stay (SKILLED_NURSING_FACILITY): Payer: Medicare Other | Admitting: Internal Medicine

## 2022-10-03 DIAGNOSIS — I69354 Hemiplegia and hemiparesis following cerebral infarction affecting left non-dominant side: Secondary | ICD-10-CM | POA: Diagnosis not present

## 2022-10-03 DIAGNOSIS — R1312 Dysphagia, oropharyngeal phase: Secondary | ICD-10-CM | POA: Diagnosis not present

## 2022-10-03 DIAGNOSIS — R569 Unspecified convulsions: Secondary | ICD-10-CM

## 2022-10-03 DIAGNOSIS — E785 Hyperlipidemia, unspecified: Secondary | ICD-10-CM | POA: Diagnosis not present

## 2022-10-03 DIAGNOSIS — I10 Essential (primary) hypertension: Secondary | ICD-10-CM

## 2022-10-03 DIAGNOSIS — I480 Paroxysmal atrial fibrillation: Secondary | ICD-10-CM | POA: Diagnosis not present

## 2022-10-03 DIAGNOSIS — S065X3S Traumatic subdural hemorrhage with loss of consciousness of 1 hour to 5 hours 59 minutes, sequela: Secondary | ICD-10-CM | POA: Diagnosis not present

## 2022-10-03 DIAGNOSIS — I5022 Chronic systolic (congestive) heart failure: Secondary | ICD-10-CM

## 2022-10-03 DIAGNOSIS — R41841 Cognitive communication deficit: Secondary | ICD-10-CM | POA: Diagnosis not present

## 2022-10-03 DIAGNOSIS — R635 Abnormal weight gain: Secondary | ICD-10-CM

## 2022-10-03 DIAGNOSIS — G4089 Other seizures: Secondary | ICD-10-CM | POA: Diagnosis not present

## 2022-10-03 NOTE — Progress Notes (Unsigned)
Location:  [facility] Room Number: 127A Place of Service:  SNF [id number]) Provider:   Mahlon Gammon, MD   Mahlon Gammon, MD  Patient Care Team: Mahlon Gammon, MD as PCP - General (Internal Medicine) Laurey Morale, MD as PCP - Cardiology (Cardiology) Mia Creek, MD as Consulting Physician (Ophthalmology) Etta Grandchild, MD (Internal Medicine)  Extended Emergency Contact Information Primary Emergency Contact: Kae Heller Address: [address]          [place], [place] [address] [place] Home Phone: [phone] Mobile Phone: [phone] Relation: Spouse Secondary Emergency Contact: Phineas Inches, [place] [address] [place] Home Phone: [phone] Relation: Son  Code Status:  DNR Goals of care: Advanced Directive information    10/03/2022    4:52 PM  Advanced Directives  Does Patient Have a Medical Advance Directive? Yes  Type of Estate agent of [place];Living will;Out of facility DNR (pink MOST or yellow form)  Does patient want to make changes to medical advance directive? No - Patient declined  Copy of Healthcare Power of Attorney in Chart? Yes - validated most recent copy scanned in chart (See row information)     Chief Complaint  Patient presents with   Acute Visit    Patient is being seen for an acute visit     HPI:  Pt is a 82 y.o. 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  Seen today for weight gain and SOB Weight up from 137 lbs  to 142 lbs We had decreased his Torsemide to 30 mg from 40 mg No Chest pain or cough Continues to be Less interactive per nurses Also noticed some edema in his legs Continues to have episodes of Apnea Today on Oxygen Did not respond much or Followed Commands today  Past Medical History:  Diagnosis Date   Acute rheumatic heart disease, unspecified    childhood, age  71 & 10   Acute rheumatic pericarditis    Atrial fibrillation (HCC)    history   CHF (congestive heart failure) (HCC)    Diverticulosis    Dysrhythmia    Enlarged aorta (HCC) 2019   External hemorrhoids without mention of complication    H/O aortic valve replacement    H/O mitral valve replacement    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   Lesion of ulnar nerve    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    Other and unspecified hyperlipidemia    Pre-diabetes    Previous back surgery 1978, jan 2007   Psychosexual dysfunction with inhibited sexual excitement    SOB (shortness of breath)    "with heavy exercise"   Stroke (HCC) 08/2013   "I WAS IN AFIB AND THREW A CLOT, THE EFFECTS WERE TRANSITORY AND DIDNT LAST BUT FOR 30 MINUTES"    Thoracic aortic aneurysm [facility])    Past Surgical History:  Procedure Laterality Date   AORTIC AND MITRAL VALVE REPLACEMENT  09/2004   CARDIOVERSION     3 times from 2004-2006   CARDIOVERSION N/A 09/26/2013   Procedure: CARDIOVERSION;  Surgeon: Laurey Morale, MD;  Location: [facility] ENDOSCOPY;  Service: Cardiovascular;  Laterality: N/A;   CARDIOVERSION N/A 06/19/2014   Procedure: CARDIOVERSION;  Surgeon: Jake Bathe, MD;  Location: [facility] ENDOSCOPY;  Service: Cardiovascular;  Laterality: N/A;   CARDIOVERSION N/A 04/24/2017   Procedure: CARDIOVERSION;  Surgeon: Laurey Morale, MD;  Location: [facility] ENDOSCOPY;  Service: Cardiovascular;  Laterality: N/A;   CARDIOVERSION N/A 06/08/2017   Procedure: CARDIOVERSION;  Surgeon: Chrystie Nose, MD;  Location: [facility] ENDOSCOPY;  Service:  Cardiovascular;  Laterality: N/A;   CARDIOVERSION N/A 08/24/2017   Procedure: CARDIOVERSION;  Surgeon: Chilton Si, MD;  Location: [facility] ENDOSCOPY;  Service: Cardiovascular;  Laterality: N/A;   CARDIOVERSION N/A 02/14/2018   Procedure: CARDIOVERSION;  Surgeon: Laurey Morale, MD;  Location: [facility] ENDOSCOPY;  Service: Cardiovascular;  Laterality: N/A;   COLONOSCOPY     CRANIOTOMY N/A 07/28/2020   Procedure: CRANIOTOMY FOR EVACUATION OF SUBDURAL HEMATOMA;  Surgeon: Tia Alert, MD;  Location: [facility] OR;  Service: Neurosurgery;  Laterality: N/A;   IR ANGIO VERTEBRAL SEL SUBCLAVIAN INNOMINATE UNI R MOD SED  08/07/2020   IR CT HEAD LTD  08/07/2020   IR PERCUTANEOUS ART THROMBECTOMY/INFUSION INTRACRANIAL INC DIAG ANGIO  08/07/2020   laminectomies     10/1975 and in 03/2005   RADIOLOGY WITH ANESTHESIA N/A 08/07/2020   Procedure: IR WITH ANESTHESIA;  Surgeon: Julieanne Cotton, MD;  Location: [facility] OR;  Service: Radiology;  Laterality: N/A;   TEE WITHOUT CARDIOVERSION N/A 12/18/2020   Procedure: TRANSESOPHAGEAL ECHOCARDIOGRAM (TEE);  Surgeon: Laurey Morale, MD;  Location: [facility] ENDOSCOPY;  Service: Cardiovascular;  Laterality: N/A;   TONSILLECTOMY     1950   TOTAL HIP ARTHROPLASTY Right 04/03/2018   Procedure: TOTAL HIP ARTHROPLASTY ANTERIOR APPROACH;  Surgeon: Durene Romans, MD;  Location: [facility] ORS;  Service: Orthopedics;  Laterality: Right;  70 mins    Allergies  Allergen Reactions   Naproxen Hives   No [employer] Food Allergies Other (See Comments)    Scallops - distress, nausea and vomitting    Outpatient Encounter Medications as of 10/03/2022  Medication Sig   acetaminophen (TYLENOL) 325 MG tablet Take 650 mg by mouth as needed for mild pain.   Amantadine HCl 100 MG tablet Take 100 mg by mouth 2 (two) times daily.   amoxicillin (AMOXIL) 500 MG capsule Take 500 mg by mouth 2 (two) times daily.   apixaban (ELIQUIS) 5 MG TABS tablet Take 1 tablet (5 mg total) by mouth 2 (two) times daily.    Artificial Saliva (BIOTENE MOISTURIZING MOUTH MT) Use as directed 2 sprays in the mouth or throat in the morning, at noon, and at bedtime.   atorvastatin (LIPITOR) 40 MG tablet Take 40 mg by mouth daily.   brivaracetam (BRIVIACT) 10 MG/ML solution Take 5 mLs (50 mg total) by mouth 2 (two) times daily.   diazePAM, 15 MG Dose, (VALTOCO 15 MG DOSE) 2 x 7.5 MG/0.1ML LQPK Place 7.5 mg into each nostril (total of 15 mg) for grand mal seizure lasting over 5 minutes. Can repeat once in 4 hours. Do Not use more than twice in 24 hours   dofetilide (TIKOSYN) 125 MCG capsule Take 3 capsules (375 mcg total) by mouth 2 (two) times daily.   finasteride (PROSCAR) 5 MG tablet Take 1 tablet (5 mg total) by mouth daily.   lacosamide (VIMPAT) 10 MG/ML oral solution  Take 50 mg by mouth 2 (two) times daily.   magnesium gluconate (MAGONATE) 500 MG tablet Take 1 tablet (500 mg total) by mouth daily.   melatonin 5 MG TABS Take 5 mg by mouth at bedtime.   methenamine (MANDELAMINE) 1 g tablet Take 1,000 mg by mouth 2 (two) times daily.   methylphenidate (RITALIN) 5 MG tablet Take 1 tablet (5 mg total) by mouth 2 (two) times daily. Take at 7 am and 12 pm   Multiple Vitamin (MULTIVITAMIN WITH MINERALS) TABS tablet Take 1 tablet by mouth daily.   omeprazole (PRILOSEC) 20 MG capsule Take 20 mg by mouth daily.   Polyethyl Glycol-Propyl Glycol (SYSTANE) 0.4-0.3 % SOLN Place 2 drops into both eyes daily.   polyethylene glycol (MIRALAX / GLYCOLAX) 17 g packet Take 17 g by mouth daily.   potassium chloride (KLOR-CON M) 10 MEQ tablet Take 20 mEq by mouth 2 (two) times daily.   senna-docusate (SENOKOT-S) 8.6-50 MG tablet Take 1 tablet by mouth at bedtime as needed.   torsemide (DEMADEX) 20 MG tablet Take 30 mg by mouth daily.   bisacodyl (DULCOLAX) 10 MG suppository Place 1 suppository (10 mg total) rectally daily as needed for moderate constipation or mild constipation. (Patient not taking: Reported on 10/03/2022)   clobetasol  (TEMOVATE) 0.05 % external solution Apply 1 application  topically as needed (itching). (Patient not taking: Reported on 10/03/2022)   famotidine (PEPCID) 20 MG tablet Take 20 mg by mouth at bedtime as needed for indigestion. (Patient not taking: Reported on 10/03/2022)   Glucerna (GLUCERNA) LIQD Take 237 mLs by mouth daily as needed. (Patient not taking: Reported on 10/03/2022)   hydrocortisone 2.5 % cream Apply 1 application  topically daily as needed (itching). (Patient not taking: Reported on 10/03/2022)   Nebulizers MISC by Does not apply route as needed. (Patient not taking: Reported on 10/03/2022)   OXYGEN Inhale into the lungs as directed. Oxygen at 2L [place] - titrate for sat > or = to 90%; May leave on for SOB Every Shift - PRN; PRN 1, PRN 2, PRN 3 (Patient not taking: Reported on 10/03/2022)   UNABLE TO FIND BiPAP  Apply BiPAP @ QHS, Make sure no water or moisture in hose (use spare hose if necessary) Reapply if taken off. Special Instructions: Check hose for moisture/water, if present use spare hose [DX: Obstructive sleep apnea (adult) (pediatric)] At Bedtime; 09:00 PM - 10:00 PM (Patient not taking: Reported on 10/03/2022)   No facility-administered encounter medications on file as of 10/03/2022.    Review of Systems  Unable to perform ROS: Other    Immunization History  Administered Date(s) Administered   Fluad Quad(high Dose 65+) 12/06/2018, 12/09/2020, 12/10/2021   Influenza Whole 12/25/2008, 01/05/2010, 12/30/2011   Influenza, High Dose Seasonal PF 12/14/2016   Influenza,inj,Quad PF,6+ Mos 12/19/2013   Influenza,inj,quad, With Preservative 11/22/2017   Influenza-Unspecified 12/14/2015, 12/24/2019, 12/10/2021   Moderna Covid-19 Vaccine Bivalent Booster 26yrs & up 01/12/2021   Moderna SARS-COV2 Booster Vaccination 01/02/2020, 06/26/2020   Moderna Sars-Covid-2 Vaccination 04/08/2019, 05/05/2019, 01/10/2022   Pneumococcal Conjugate-13 12/19/2013   Pneumococcal Polysaccharide-23  09/24/2007, 05/22/2017   Rsv, Bivalent, Protein Subunit Rsvpref,pf Verdis Frederickson) 02/14/2022   Td 12/25/2008   Td (Adult), 2 Lf Tetanus Toxid, Preservative Free 12/25/2008   Tdap 03/22/2018   Zoster Recombinant(Shingrix) 07/16/2019, 10/28/2019   Zoster, Live 09/24/2007   Pertinent  Health Maintenance Due  Topic Date Due   OPHTHALMOLOGY EXAM  10/06/2022 (Originally 10/04/1950)   INFLUENZA VACCINE  10/06/2022  FOOT EXAM  10/23/2022   HEMOGLOBIN A1C  01/04/2023   Colonoscopy  Discontinued      08/11/2021    7:30 PM 08/12/2021    8:00 AM 10/13/2021    2:32 PM 02/02/2022   11:24 AM 04/20/2022    3:01 PM  Fall Risk  Falls in the past year?   0 0 0  Was there an injury with Fall?    0 0  Fall Risk Category Calculator    0 0  Fall Risk Category (Retired)    Low   (RETIRED) Patient Fall Risk Level High fall risk High fall risk  High fall risk   Patient at Risk for Falls Due to    Impaired mobility   Fall risk Follow up    Falls evaluation completed    Functional Status Survey:    Vitals:   10/03/22 1650  BP: (!) 120/90  Pulse: 65  Resp: (!) 23  Temp: (!) 96.5 F (35.8 C)  TempSrc: Temporal  SpO2: 98%  Weight: 142 lb 9.6 oz (64.7 kg)  Height: 5\' 7"  (1.702 m)   Body mass index is 22.33 kg/m. Physical Exam Vitals reviewed.  Constitutional:      Appearance: Normal appearance.  HENT:     Head: Normocephalic.     Nose: Nose normal.     Mouth/Throat:     Mouth: Mucous membranes are moist.     Pharynx: Oropharynx is clear.  Eyes:     Pupils: Pupils are equal, round, and reactive to light.  Cardiovascular:     Rate and Rhythm: Normal rate and regular rhythm.     Pulses: Normal pulses.     Heart sounds: Murmur heard.  Pulmonary:     Effort: Pulmonary effort is normal. No respiratory distress.     Breath sounds: Rales present.  Abdominal:     General: Abdomen is flat. Bowel sounds are normal.     Palpations: Abdomen is soft.  Musculoskeletal:     Cervical back: Neck supple.      Comments: Mild edema Bilateral  Skin:    General: Skin is warm.  Neurological:     Mental Status: He is alert.  Psychiatric:        Mood and Affect: Mood normal.        Thought Content: Thought content normal.     Labs reviewed: Recent Labs    12/13/21 1157 12/14/21 0000 02/02/22 1202 03/07/22 0000 08/02/22 0000 08/10/22 1102 09/01/22 1123  NA  --    < > 145   < > 145 147* 144  K  --    < > 3.6   < > 3.8 3.8 3.4*  CL  --    < > 107   < > 108 107 108  CO2  --    < > 28   < > 23* 25 26  GLUCOSE  --    < > 126*  --   --  136* 158*  BUN  --    < > 32*   < > 30* 31* 29*  CREATININE  --    < > 1.14   < > 1.1 1.10 1.06  CALCIUM  --    < > 9.2   < > 9.1 9.4 9.0  MG 2.4  --   --   --   --   --  2.5*   < > = values in this interval not displayed.   Recent Labs  01/03/22 1322 02/02/22 1202 06/07/22 0000 08/10/22 1102  AST 25 22 33 34  ALT 20 23 42* 54*  ALKPHOS 100  --  107  --   BILITOT 1.1 0.9  --  1.0  PROT 7.4 7.1  --  7.3  ALBUMIN 3.6  --  4.2  --    Recent Labs    02/02/22 1202 03/14/22 0000 08/10/22 1102 09/01/22 1123  WBC 9.4 8.1 9.0 9.3  NEUTROABS 6,110  --  6,759  --   HGB 9.0* 10.3* 12.2* 11.7*  HCT 28.3* 32* 37.1* 36.7*  MCV 86.0  --  93.0 96.1  PLT 261 240 220 171   Lab Results  Component Value Date   TSH 1.74 07/05/2022   Lab Results  Component Value Date   HGBA1C 6.3 07/05/2022   Lab Results  Component Value Date   CHOL 98 06/14/2022   HDL 32 (A) 06/14/2022   LDLCALC 54 06/14/2022   TRIG 57 06/14/2022   CHOLHDL 2.8 08/08/2020    Significant Diagnostic Results in last 30 days:  No results found.  Assessment/Plan 1. Chronic systolic CHF (congestive heart failure) (HCC) Change Torsemide to 40 mg  Change potassium to 40 Meq Patient gets BMP Q weekly per his Wife's request  2. Weight gain AS per above  3. Seizure (HCC) On Vimpat and Briviact   4. PAF (paroxysmal atrial fibrillation) (HCC) On Eliquis and Tikosyn Widen QRS  per cardiology EKG noticed Repeat EKG after correcting for hypokalemia was normal  5. Hyperlipidemia with target LDL less than 130 Statin LDL 54 in 4/24 6 Chronic Encephalopathy Continues to decline Less interactive More Sleepy On Ritalin and Amantadine 7. S/p CVA On Eliquis   8. Prosthetic valve endocarditis, subsequent encounter Amoxicillin Suppressive therapy   9. Hypernatremia Sodium fluctuates between 145-149   10. 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 Benign prostatic hyperplasia without lower urinary tract symptoms Proscar   12. Recurrent UTI Methenamine per urology   Family/ staff Communication:   Labs/tests ordered:

## 2022-10-04 DIAGNOSIS — I10 Essential (primary) hypertension: Secondary | ICD-10-CM | POA: Diagnosis not present

## 2022-10-05 DIAGNOSIS — G4089 Other seizures: Secondary | ICD-10-CM | POA: Diagnosis not present

## 2022-10-05 DIAGNOSIS — I5022 Chronic systolic (congestive) heart failure: Secondary | ICD-10-CM | POA: Diagnosis not present

## 2022-10-05 DIAGNOSIS — S065X3S Traumatic subdural hemorrhage with loss of consciousness of 1 hour to 5 hours 59 minutes, sequela: Secondary | ICD-10-CM | POA: Diagnosis not present

## 2022-10-05 DIAGNOSIS — R41841 Cognitive communication deficit: Secondary | ICD-10-CM | POA: Diagnosis not present

## 2022-10-05 DIAGNOSIS — I69354 Hemiplegia and hemiparesis following cerebral infarction affecting left non-dominant side: Secondary | ICD-10-CM | POA: Diagnosis not present

## 2022-10-05 DIAGNOSIS — R1312 Dysphagia, oropharyngeal phase: Secondary | ICD-10-CM | POA: Diagnosis not present

## 2022-10-06 ENCOUNTER — Encounter: Payer: Self-pay | Admitting: Adult Health

## 2022-10-06 ENCOUNTER — Non-Acute Institutional Stay (SKILLED_NURSING_FACILITY): Payer: Medicare Other | Admitting: Adult Health

## 2022-10-06 DIAGNOSIS — R41841 Cognitive communication deficit: Secondary | ICD-10-CM | POA: Diagnosis not present

## 2022-10-06 DIAGNOSIS — I5022 Chronic systolic (congestive) heart failure: Secondary | ICD-10-CM

## 2022-10-06 DIAGNOSIS — U071 COVID-19: Secondary | ICD-10-CM

## 2022-10-06 DIAGNOSIS — G4089 Other seizures: Secondary | ICD-10-CM | POA: Diagnosis not present

## 2022-10-06 DIAGNOSIS — M6389 Disorders of muscle in diseases classified elsewhere, multiple sites: Secondary | ICD-10-CM | POA: Diagnosis not present

## 2022-10-06 DIAGNOSIS — R293 Abnormal posture: Secondary | ICD-10-CM | POA: Diagnosis not present

## 2022-10-06 DIAGNOSIS — R41840 Attention and concentration deficit: Secondary | ICD-10-CM | POA: Diagnosis not present

## 2022-10-06 DIAGNOSIS — R5383 Other fatigue: Secondary | ICD-10-CM | POA: Diagnosis not present

## 2022-10-06 DIAGNOSIS — R278 Other lack of coordination: Secondary | ICD-10-CM | POA: Diagnosis not present

## 2022-10-06 DIAGNOSIS — I69354 Hemiplegia and hemiparesis following cerebral infarction affecting left non-dominant side: Secondary | ICD-10-CM | POA: Diagnosis not present

## 2022-10-06 DIAGNOSIS — H538 Other visual disturbances: Secondary | ICD-10-CM | POA: Diagnosis not present

## 2022-10-06 DIAGNOSIS — S065X3S Traumatic subdural hemorrhage with loss of consciousness of 1 hour to 5 hours 59 minutes, sequela: Secondary | ICD-10-CM | POA: Diagnosis not present

## 2022-10-06 DIAGNOSIS — I69812 Visuospatial deficit and spatial neglect following other cerebrovascular disease: Secondary | ICD-10-CM | POA: Diagnosis not present

## 2022-10-06 DIAGNOSIS — R1312 Dysphagia, oropharyngeal phase: Secondary | ICD-10-CM | POA: Diagnosis not present

## 2022-10-06 MED ORDER — VALTOCO 15 MG DOSE 7.5 MG/0.1ML NA LQPK: NASAL | Status: DC

## 2022-10-06 MED ORDER — MOLNUPIRAVIR EUA 200MG CAPSULE: 4.0000 | ORAL_CAPSULE | Freq: Two times a day (BID) | ORAL | 0 refills | Status: AC

## 2022-10-06 NOTE — Addendum Note (Signed)
Addended by: Esmond Camper on: 10/06/2022 02:11 PM   Modules accepted: Orders

## 2022-10-06 NOTE — Progress Notes (Addendum)
[facility] [employer]  Skilled care  Provider:  Peggye Ley, ANP [facility] [phone]   Code Status: DNR Goals of Care:     10/03/2022    4:52 PM  Advanced Directives  Does Patient Have a Medical Advance Directive? Yes  Type of Estate agent of [place];Living will;Out of facility DNR (pink MOST or yellow form)  Does patient want to make changes to medical advance directive? No - Patient declined  Copy of Healthcare Power of Attorney in Chart? Yes - validated most recent copy scanned in chart (See row information)     Chief Complaint  Patient presents with   Acute Visit    lethargy    HPI: Patient is a 82 y.o. 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   Ms. [name] [name] request that I see [name] [name] today due to lethargy.  She reports that his birthday was yesterday and he slept all day and did not interact with family. He had a slight cough x 1 which resolved. He has not had a seizure in the past month. No fever  Was seen on 7/29 and torsemide increased to 40 mg due to weight gain and edema. Weight is down 3 lbs. No sob. He was also having lethargy at that time.   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   Lesion of ulnar nerve    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    Other and unspecified hyperlipidemia    Pre-diabetes    Previous back surgery 1978, jan 2007   Psychosexual dysfunction with inhibited sexual excitement    SOB (shortness of breath)    "with heavy exercise"   Stroke (HCC) 08/2013   "I WAS IN AFIB AND THREW A CLOT, THE EFFECTS WERE TRANSITORY AND DIDNT LAST BUT FOR 30 MINUTES"    Thoracic aortic aneurysm [facility])     Past Surgical History:  Procedure Laterality Date   AORTIC AND MITRAL VALVE REPLACEMENT     09/2004   CARDIOVERSION     3 times from 2004-2006   CARDIOVERSION N/A 09/26/2013   Procedure: CARDIOVERSION;  Surgeon: Laurey Morale, MD;  Location: [facility] ENDOSCOPY;  Service: Cardiovascular;  Laterality: N/A;   CARDIOVERSION N/A 06/19/2014   Procedure: CARDIOVERSION;  Surgeon: Jake Bathe, MD;  Location: [facility] ENDOSCOPY;  Service: Cardiovascular;  Laterality: N/A;   CARDIOVERSION N/A 04/24/2017   Procedure: CARDIOVERSION;  Surgeon: Laurey Morale, MD;  Location: [facility] ENDOSCOPY;  Service: Cardiovascular;  Laterality: N/A;   CARDIOVERSION N/A 06/08/2017   Procedure: CARDIOVERSION;  Surgeon: Chrystie Nose, MD;  Location: [facility] ENDOSCOPY;  Service: Cardiovascular;  Laterality: N/A;   CARDIOVERSION N/A 08/24/2017   Procedure: CARDIOVERSION;  Surgeon: Chilton Si, MD;  Location: [facility] ENDOSCOPY;  Service: Cardiovascular;  Laterality: N/A;   CARDIOVERSION N/A 02/14/2018   Procedure: CARDIOVERSION;  Surgeon: Laurey Morale, MD;  Location: [facility] ENDOSCOPY;  Service: Cardiovascular;  Laterality: N/A;   COLONOSCOPY     CRANIOTOMY N/A 07/28/2020   Procedure: CRANIOTOMY FOR EVACUATION OF SUBDURAL HEMATOMA;  Surgeon: Tia Alert, MD;  Location: [facility] OR;  Service: Neurosurgery;  Laterality: N/A;   IR ANGIO VERTEBRAL SEL SUBCLAVIAN INNOMINATE UNI R MOD SED  08/07/2020   IR CT HEAD LTD  08/07/2020   IR  PERCUTANEOUS ART THROMBECTOMY/INFUSION INTRACRANIAL INC DIAG ANGIO  08/07/2020   laminectomies     10/1975 and in 03/2005   RADIOLOGY WITH ANESTHESIA N/A 08/07/2020   Procedure: IR WITH ANESTHESIA;  Surgeon: Julieanne Cotton, MD;  Location: [facility] OR;  Service: Radiology;  Laterality: N/A;   TEE WITHOUT CARDIOVERSION N/A 12/18/2020   Procedure: TRANSESOPHAGEAL ECHOCARDIOGRAM (TEE);  Surgeon: Laurey Morale, MD;  Location: [facility] ENDOSCOPY;  Service: Cardiovascular;  Laterality: N/A;   TONSILLECTOMY     1950   TOTAL HIP ARTHROPLASTY Right 04/03/2018   Procedure: TOTAL HIP ARTHROPLASTY ANTERIOR APPROACH;  Surgeon: Durene Romans, MD;  Location: [facility] ORS;  Service: Orthopedics;  Laterality: Right;  70 mins    Allergies  Allergen Reactions   Naproxen Hives   No [employer] Food Allergies Other (See Comments)    Scallops - distress, nausea and vomitting    Outpatient Encounter Medications as of 10/06/2022  Medication Sig   acetaminophen (TYLENOL) 325 MG tablet Take 650 mg by mouth as needed for mild pain.   Amantadine HCl 100 MG tablet Take 100 mg by mouth 2 (two) times daily.   amoxicillin (AMOXIL) 500 MG capsule Take 500 mg by mouth 2 (two) times daily.   apixaban (ELIQUIS) 5 MG TABS tablet Take 1 tablet (5 mg total) by mouth 2 (two) times daily.   Artificial Saliva (BIOTENE MOISTURIZING MOUTH MT) Use as directed 2 sprays in the mouth or throat in the morning, at noon, and at bedtime.   atorvastatin (LIPITOR) 40 MG tablet Take 40 mg by mouth daily.   brivaracetam (BRIVIACT) 10 MG/ML solution Take 5 mLs (50 mg total) by mouth 2 (two) times daily.   diazePAM, 15 MG Dose, (VALTOCO 15 MG DOSE) 2 x 7.5 MG/0.1ML LQPK Place 7.5 mg into each nostril (total of 15 mg) for grand mal seizure lasting over 2 minutes. Can repeat once in 4 hours. Do Not use more than twice in 24 hours   dofetilide (TIKOSYN) 125 MCG capsule Take 3 capsules (375 mcg total) by mouth 2 (two) times daily.   finasteride (PROSCAR) 5 MG  tablet Take 1 tablet (5 mg total) by mouth daily.   lacosamide (VIMPAT) 10 MG/ML oral solution Take 50 mg by mouth 2 (two) times daily.   magnesium gluconate (MAGONATE) 500 MG tablet Take 1 tablet (500 mg total) by mouth daily.   melatonin 5 MG TABS Take 5 mg by mouth at bedtime.   methenamine (MANDELAMINE) 1 g tablet Take 1,000 mg by mouth 2 (two) times daily.   methylphenidate (RITALIN) 5 MG tablet Take 1 tablet (5 mg total) by mouth 2 (two) times daily. Take at 7 am and 12 pm   Multiple Vitamin (MULTIVITAMIN WITH MINERALS) TABS tablet Take 1 tablet by mouth daily.  omeprazole (PRILOSEC) 20 MG capsule Take 20 mg by mouth daily.   OXYGEN Inhale into the lungs as directed. Oxygen at 2L [place] - titrate for sat > or = to 90%; May leave on for SOB Every Shift - PRN; PRN 1, PRN 2, PRN 3   Polyethyl Glycol-Propyl Glycol (SYSTANE) 0.4-0.3 % SOLN Place 2 drops into both eyes daily.   polyethylene glycol (MIRALAX / GLYCOLAX) 17 g packet Take 17 g by mouth daily.   potassium chloride (KLOR-CON M) 10 MEQ tablet Take 20 mEq by mouth 2 (two) times daily. in the am and 20 meq at noon   senna-docusate (SENOKOT-S) 8.6-50 MG tablet Take 1 tablet by mouth at bedtime as needed.   torsemide (DEMADEX) 20 MG tablet Take 20 mg by mouth 2 (two) times daily. 20 mg in the am and 20 mg at noon   UNABLE TO FIND BiPAP  Apply BiPAP @ [facility], Make sure no water or moisture in hose (use spare hose if necessary) Reapply if taken off. Special Instructions: Check hose for moisture/water, if present use spare hose [DX: Obstructive sleep apnea (adult) (pediatric)] At Bedtime; 09:00 PM - 10:00 PM (Patient not taking: Reported on 10/03/2022)   [DISCONTINUED] diazePAM, 15 MG Dose, (VALTOCO 15 MG DOSE) 2 x 7.5 MG/0.1ML LQPK Place 7.5 mg into each nostril (total of 15 mg) for grand mal seizure lasting over 5 minutes. Can repeat once in 4 hours. Do Not use more than twice in 24 hours (Patient taking differently: Place 7.5 mg into each  nostril (total of 15 mg) for grand mal seizure lasting over 2 minutes. Can repeat once in 4 hours. Do Not use more than twice in 24 hours)   No facility-administered encounter medications on file as of 10/06/2022.    Review of Systems:  Review of Systems  Unable to perform ROS: Mental status change    Health Maintenance  Topic Date Due   Medicare Annual Wellness (AWV)  07/10/2020   COVID-19 Vaccine (5 - 2023-24 season) 03/07/2022   INFLUENZA VACCINE  10/06/2022   OPHTHALMOLOGY EXAM  10/06/2022 (Originally 10/04/1950)   Diabetic kidney evaluation - Urine ACR  07/01/2027 (Originally 10/04/1958)   FOOT EXAM  10/23/2022   HEMOGLOBIN A1C  01/04/2023   Diabetic kidney evaluation - eGFR measurement  09/01/2023   DTaP/Tdap/Td (4 - Td or Tdap) 03/22/2028   Pneumonia Vaccine 75+ Years old  Completed   Zoster Vaccines- Shingrix  Completed   HPV VACCINES  Aged Out   Colonoscopy  Discontinued    Physical Exam: Vitals:   10/06/22 1158  BP: 111/80  Pulse: 65  Resp: (!) 22  Temp: 97.8 F (36.6 C)  SpO2: 96%  Weight: 139 lb 6.4 oz (63.2 kg)   Body mass index is 21.83 kg/m. Physical Exam Vitals and nursing note reviewed.  Constitutional:      General: He is not in acute distress.    Appearance: He is not diaphoretic.  HENT:     Head: Normocephalic and atraumatic.     Nose: Nose normal.     Mouth/Throat:     Mouth: Mucous membranes are moist.     Pharynx: Oropharynx is clear.  Eyes:     Conjunctiva/sclera: Conjunctivae normal.     Pupils: Pupils are equal, round, and reactive to light.  Neck:     Thyroid: No thyromegaly.     Vascular: No JVD.     Trachea: No tracheal deviation.  Cardiovascular:     Rate and  Rhythm: Normal rate. Rhythm irregular.     Heart sounds: No murmur heard. Pulmonary:     Effort: Pulmonary effort is normal. No respiratory distress.     Breath sounds: Normal breath sounds. No wheezing.  Abdominal:     General: Bowel sounds are normal. There is no  distension.     Palpations: Abdomen is soft.     Tenderness: There is no abdominal tenderness.  Musculoskeletal:     Cervical back: Normal range of motion and neck supple.     Left lower leg: No edema.     Comments: Trace pitting edema right ankle  Lymphadenopathy:     Cervical: No cervical adenopathy.  Skin:    General: Skin is warm and dry.  Neurological:     Mental Status: He is alert.     Comments: Left sided weakness. Intermittently able to f/c.  Keeps eyes closed. No verbalization     Labs reviewed: Basic Metabolic Panel: Recent Labs    12/13/21 1157 12/14/21 0000 02/02/22 1202 03/07/22 0000 07/05/22 0000 08/02/22 0000 08/10/22 1102 09/01/22 1123  NA  --    < > 145   < > 148* 145 147* 144  K  --    < > 3.6   < > 4.1 3.8 3.8 3.4*  CL  --    < > 107   < > 106 108 107 108  CO2  --    < > 28   < > 24* 23* 25 26  GLUCOSE  --    < > 126*  --   --   --  136* 158*  BUN  --    < > 32*   < > 24* 30* 31* 29*  CREATININE  --    < > 1.14   < > 1.0 1.1 1.10 1.06  CALCIUM  --    < > 9.2   < > 9.4 9.1 9.4 9.0  MG 2.4  --   --   --   --   --   --  2.5*  TSH  --   --   --   --  1.74  --   --   --    < > = values in this interval not displayed.   Liver Function Tests: Recent Labs    01/03/22 1322 02/02/22 1202 06/07/22 0000 08/10/22 1102  AST 25 22 33 34  ALT 20 23 42* 54*  ALKPHOS 100  --  107  --   BILITOT 1.1 0.9  --  1.0  PROT 7.4 7.1  --  7.3  ALBUMIN 3.6  --  4.2  --    No results for input(s): "LIPASE", "AMYLASE" in the last 8760 hours. No results for input(s): "AMMONIA" in the last 8760 hours. CBC: Recent Labs    02/02/22 1202 03/14/22 0000 08/10/22 1102 09/01/22 1123  WBC 9.4 8.1 9.0 9.3  NEUTROABS 6,110  --  6,759  --   HGB 9.0* 10.3* 12.2* 11.7*  HCT 28.3* 32* 37.1* 36.7*  MCV 86.0  --  93.0 96.1  PLT 261 240 220 171   Lipid Panel: Recent Labs    06/14/22 0000  CHOL 98  HDL 32*  LDLCALC 54  TRIG 57   Lab Results  Component Value Date    HGBA1C 6.3 07/05/2022    Procedures since last visit: No results found.  Assessment/Plan 1. Lethargy He has been progressively less interactive over time As of 7/29 he was also  lethargic and treated for CHF BMP 7/30 did not show any hypernatremia His lethargy could be due to his hx of multiple CVAs and seizure disorder and also CHF. He has not had a witnessed seizure, but could have had one at night that we would not be aware of.   There are no new meds this month Will check UA C and S as he has a hx of this and urinary retention Check covid swab due to exposure at [facility]   2. Chronic systolic CHF (congestive heart failure) (HCC) Weight is coming down No sob or worsening edema  3. Advanced care planning  I met with his wife Nicole Kindred today. She is concerned about his lethargy. Her goal is for him to have a quality of life by being alert to interact with family. His birthday was [birth year] and family came to visit and he slept the whole day. Due to his multiple health problems we discussed that this could be a progression of worsening cognition due to cerebrovascular disease which is expected. We will check for a UTI to look for infection which would be reversible. If this is new baseline she is willing to accept that and would not want aggressive measures. He does have a DNR. He is getting labs weekly. She would like to keep it this way but if he worsens she is open to re addressing this measure.     Labs/tests ordered:  UA C and S A  Addendum Resident tested positive for Covid Called Ms Prohaska and let her know Will implement isolation Begin molnupiravir (has drug interaction with paxlovid)

## 2022-10-08 DIAGNOSIS — I517 Cardiomegaly: Secondary | ICD-10-CM | POA: Diagnosis not present

## 2022-10-08 DIAGNOSIS — U071 COVID-19: Secondary | ICD-10-CM | POA: Diagnosis not present

## 2022-10-08 DIAGNOSIS — J690 Pneumonitis due to inhalation of food and vomit: Secondary | ICD-10-CM | POA: Diagnosis not present

## 2022-10-08 DIAGNOSIS — J984 Other disorders of lung: Secondary | ICD-10-CM | POA: Diagnosis not present

## 2022-10-10 ENCOUNTER — Non-Acute Institutional Stay: Payer: Self-pay | Admitting: Adult Health

## 2022-10-10 ENCOUNTER — Encounter: Payer: Self-pay | Admitting: Adult Health

## 2022-10-10 DIAGNOSIS — J9611 Chronic respiratory failure with hypoxia: Secondary | ICD-10-CM

## 2022-10-10 DIAGNOSIS — R58 Hemorrhage, not elsewhere classified: Secondary | ICD-10-CM

## 2022-10-10 DIAGNOSIS — E118 Type 2 diabetes mellitus with unspecified complications: Secondary | ICD-10-CM

## 2022-10-10 DIAGNOSIS — U071 COVID-19: Secondary | ICD-10-CM | POA: Diagnosis not present

## 2022-10-10 LAB — CBC AND DIFFERENTIAL
HCT: 35 — AB (ref 41–53)
Hemoglobin: 11.5 — AB (ref 13.5–17.5)
Platelets: 193 10*3/uL (ref 150–400)
WBC: 8.9

## 2022-10-10 LAB — CBC: RBC: 3.68 — AB (ref 3.87–5.11)

## 2022-10-10 MED ORDER — AZITHROMYCIN 250 MG PO TABS
ORAL_TABLET | ORAL | 0 refills | Status: AC
Start: 2022-10-10 — End: 2022-10-15

## 2022-10-10 MED ORDER — DEXAMETHASONE 2 MG PO TABS
4.0000 mg | ORAL_TABLET | Freq: Every day | ORAL | 0 refills | Status: AC
Start: 2022-10-10 — End: 2022-10-17

## 2022-10-10 NOTE — Progress Notes (Signed)
Location:  [facility] Room Number: 127A Place of Service:  SNF [id number]) Provider:  Tamsen Roers, MD  Patient Care Team: Mahlon Gammon, MD as PCP - General (Internal Medicine) Laurey Morale, MD as PCP - Cardiology (Cardiology) Mia Creek, MD as Consulting Physician (Ophthalmology) Etta Grandchild, MD (Internal Medicine)  Extended Emergency Contact Information Primary Emergency Contact: Kae Heller Address: [address]          [place], [place] [address] [place] Home Phone: [phone] Mobile Phone: [phone] Relation: Spouse Secondary Emergency Contact: Phineas Inches, [place] [address] [place] Home Phone: [phone] Relation: Son  Code Status:  DNR Goals of care: Advanced Directive information    10/10/2022    9:58 AM  Advanced Directives  Does Patient Have a Medical Advance Directive? Yes  Type of Estate agent of [employer];Living will;Out of facility DNR (pink MOST or yellow form)  Does patient want to make changes to medical advance directive? No - Patient declined  Copy of Healthcare Power of Attorney in Chart? Yes - validated most recent copy scanned in chart (See row information)     Chief Complaint  Patient presents with   Acute Visit    Patient is being seen for covid    Immunizations    Patient is due for flu and updated covid vaccine    Quality Metric Gaps    Patient is due for AWV and eye exam     HPI:  Pt is a 82 y.o. 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   Seen for lethargy on 8/1.  He had a slight cough. Rapid covid swab came back positive. He was placed on Molnupiravir. Over the weekend he had  increased respiration and mottling. O2 sats were normal. BP ok. His wife declined hospitalization. CXR ordered which showed bilateral interstitial pneumonitis. Oncall provider ordered zithromax and augmentin.   As of this am (10/10/22) no cough or congestion. Sats 98% on 2-3 liters. He intermittently wears oxygen chronically. Still has some mottling on the thighs which is improved per the nurse (was down both legs).  He ate breakfast and has been drinking an adequate amt. BP 124/64. Weight down 2 lbs 137lbs. His RR intermittently is 25-30.  This is also an intermittent problem not new but more pronounced since having covid. Nurse also reports increased bruising to arms and ear for two weeks.  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    Other and unspecified hyperlipidemia    Pre-diabetes    Previous back surgery 1978, jan 2007  Psychosexual dysfunction with inhibited sexual excitement    SOB (shortness of breath)    "with heavy exercise"   Stroke (HCC) 08/2013   "I WAS IN AFIB AND THREW A CLOT, THE EFFECTS WERE TRANSITORY AND DIDNT LAST BUT FOR 30 MINUTES"    Thoracic aortic aneurysm [facility])    Past Surgical History:  Procedure Laterality Date   AORTIC AND MITRAL VALVE REPLACEMENT     09/2004   CARDIOVERSION     3 times from 2004-2006   CARDIOVERSION N/A 09/26/2013   Procedure: CARDIOVERSION;  Surgeon: Laurey Morale, MD;  Location: [facility] ENDOSCOPY;  Service: Cardiovascular;  Laterality: N/A;    CARDIOVERSION N/A 06/19/2014   Procedure: CARDIOVERSION;  Surgeon: Jake Bathe, MD;  Location: [facility] ENDOSCOPY;  Service: Cardiovascular;  Laterality: N/A;   CARDIOVERSION N/A 04/24/2017   Procedure: CARDIOVERSION;  Surgeon: Laurey Morale, MD;  Location: [facility] ENDOSCOPY;  Service: Cardiovascular;  Laterality: N/A;   CARDIOVERSION N/A 06/08/2017   Procedure: CARDIOVERSION;  Surgeon: Chrystie Nose, MD;  Location: [facility] ENDOSCOPY;  Service: Cardiovascular;  Laterality: N/A;   CARDIOVERSION N/A 08/24/2017   Procedure: CARDIOVERSION;  Surgeon: Chilton Si, MD;  Location: [facility] ENDOSCOPY;  Service: Cardiovascular;  Laterality: N/A;   CARDIOVERSION N/A 02/14/2018   Procedure: CARDIOVERSION;  Surgeon: Laurey Morale, MD;  Location: [facility] ENDOSCOPY;  Service: Cardiovascular;  Laterality: N/A;   COLONOSCOPY     CRANIOTOMY N/A 07/28/2020   Procedure: CRANIOTOMY FOR EVACUATION OF SUBDURAL HEMATOMA;  Surgeon: Tia Alert, MD;  Location: [facility] OR;  Service: Neurosurgery;  Laterality: N/A;   IR ANGIO VERTEBRAL SEL SUBCLAVIAN INNOMINATE UNI R MOD SED  08/07/2020   IR CT HEAD LTD  08/07/2020   IR PERCUTANEOUS ART THROMBECTOMY/INFUSION INTRACRANIAL INC DIAG ANGIO  08/07/2020   laminectomies     10/1975 and in 03/2005   RADIOLOGY WITH ANESTHESIA N/A 08/07/2020   Procedure: IR WITH ANESTHESIA;  Surgeon: Julieanne Cotton, MD;  Location: [facility] OR;  Service: Radiology;  Laterality: N/A;   TEE WITHOUT CARDIOVERSION N/A 12/18/2020   Procedure: TRANSESOPHAGEAL ECHOCARDIOGRAM (TEE);  Surgeon: Laurey Morale, MD;  Location: [facility] ENDOSCOPY;  Service: Cardiovascular;  Laterality: N/A;   TONSILLECTOMY     1950   TOTAL HIP ARTHROPLASTY Right 04/03/2018   Procedure: TOTAL HIP ARTHROPLASTY ANTERIOR APPROACH;  Surgeon: Durene Romans, MD;  Location: [facility] ORS;  Service: Orthopedics;  Laterality: Right;  70 mins    Allergies  Allergen Reactions   Naproxen Hives   No [employer] Food Allergies Other (See Comments)    Scallops -  distress, nausea and vomitting    Outpatient Encounter Medications as of 10/10/2022  Medication Sig   acetaminophen (TYLENOL) 325 MG tablet Take 650 mg by mouth as needed for mild pain.   Amantadine HCl 100 MG tablet Take 100 mg by mouth 2 (two) times daily.   amoxicillin (AMOXIL) 500 MG capsule Take 500 mg by mouth 2 (two) times daily.   amoxicillin-clavulanate (AUGMENTIN) 875-125 MG tablet Take 1 tablet by mouth 2 (two) times daily.   apixaban (ELIQUIS) 5 MG TABS tablet Take 1 tablet (5 mg total) by mouth 2 (two) times daily.   Artificial Saliva (BIOTENE MOISTURIZING MOUTH MT) Use as directed 2 sprays in the mouth or throat in the morning, at noon, and at bedtime.   atorvastatin (LIPITOR) 40 MG tablet Take 40 mg by mouth daily.   brivaracetam (BRIVIACT) 10 MG/ML solution Take 5 mLs (50 mg total) by mouth 2 (two) times daily.  diazePAM, 15 MG Dose, (VALTOCO 15 MG DOSE) 2 x 7.5 MG/0.1ML LQPK Place 7.5 mg into each nostril (total of 15 mg) for grand mal seizure lasting over 2 minutes. Can repeat once in 4 hours. Do Not use more than twice in 24 hours   dofetilide (TIKOSYN) 125 MCG capsule Take 3 capsules (375 mcg total) by mouth 2 (two) times daily.   finasteride (PROSCAR) 5 MG tablet Take 1 tablet (5 mg total) by mouth daily.   lacosamide (VIMPAT) 10 MG/ML oral solution Take 50 mg by mouth 2 (two) times daily.   magnesium gluconate (MAGONATE) 500 MG tablet Take 1 tablet (500 mg total) by mouth daily.   melatonin 5 MG TABS Take 5 mg by mouth at bedtime.   methenamine (MANDELAMINE) 1 g tablet Take 1,000 mg by mouth 2 (two) times daily.   methylphenidate (RITALIN) 5 MG tablet Take 1 tablet (5 mg total) by mouth 2 (two) times daily. Take at 7 am and 12 pm   molnupiravir EUA (LAGEVRIO) 200 mg CAPS capsule Take 4 capsules (800 mg total) by mouth 2 (two) times daily for 5 days.   Multiple Vitamin (MULTIVITAMIN WITH MINERALS) TABS tablet Take 1 tablet by mouth daily.   omeprazole (PRILOSEC) 20 MG  capsule Take 20 mg by mouth daily.   Polyethyl Glycol-Propyl Glycol (SYSTANE) 0.4-0.3 % SOLN Place 2 drops into both eyes daily.   polyethylene glycol (MIRALAX / GLYCOLAX) 17 g packet Take 17 g by mouth daily.   potassium chloride (KLOR-CON M) 10 MEQ tablet Take 20 mEq by mouth 2 (two) times daily. in the am and 20 meq at noon   torsemide (DEMADEX) 20 MG tablet Take 20 mg by mouth 2 (two) times daily. 20 mg in the am and 20 mg at noon   UNABLE TO FIND BiPAP  Apply BiPAP @ [facility], Make sure no water or moisture in hose (use spare hose if necessary) Reapply if taken off. Special Instructions: Check hose for moisture/water, if present use spare hose [DX: Obstructive sleep apnea (adult) (pediatric)] At Bedtime; 09:00 PM - 10:00 PM   OXYGEN Inhale into the lungs as directed. Oxygen at 2L [facility] - titrate for sat > or = to 90%; May leave on for SOB Every Shift - PRN; PRN 1, PRN 2, PRN 3 (Patient not taking: Reported on 10/10/2022)   senna-docusate (SENOKOT-S) 8.6-50 MG tablet Take 1 tablet by mouth at bedtime as needed. (Patient not taking: Reported on 10/10/2022)   No facility-administered encounter medications on file as of 10/10/2022.    Review of Systems  Unable to perform ROS: Mental status change    Immunization History  Administered Date(s) Administered   Fluad Quad(high Dose 65+) 12/06/2018, 12/09/2020, 12/10/2021   Influenza Whole 12/25/2008, 01/05/2010, 12/30/2011   Influenza, High Dose Seasonal PF 12/14/2016   Influenza,inj,Quad PF,6+ Mos 12/19/2013   Influenza,inj,quad, With Preservative 11/22/2017   Influenza-Unspecified 12/14/2015, 12/24/2019, 12/10/2021   Moderna Covid-19 Vaccine Bivalent Booster 66yrs & up 01/12/2021   Moderna SARS-COV2 Booster Vaccination 01/02/2020, 06/26/2020   Moderna Sars-Covid-2 Vaccination 04/08/2019, 05/05/2019, 01/10/2022   Pneumococcal Conjugate-13 12/19/2013   Pneumococcal Polysaccharide-23 09/24/2007, 05/22/2017   Rsv, Bivalent, Protein Subunit  Rsvpref,pf Verdis Frederickson) 02/14/2022   Td 12/25/2008   Td (Adult), 2 Lf Tetanus Toxid, Preservative Free 12/25/2008   Tdap 03/22/2018   Zoster Recombinant(Shingrix) 07/16/2019, 10/28/2019   Zoster, Live 09/24/2007   Pertinent  Health Maintenance Due  Topic Date Due   OPHTHALMOLOGY EXAM  Never done   INFLUENZA  VACCINE  10/06/2022   FOOT EXAM  10/23/2022   HEMOGLOBIN A1C  01/04/2023   Colonoscopy  Discontinued      08/11/2021    7:30 PM 08/12/2021    8:00 AM 10/13/2021    2:32 PM 02/02/2022   11:24 AM 04/20/2022    3:01 PM  Fall Risk  Falls in the past year?   0 0 0  Was there an injury with Fall?    0 0  Fall Risk Category Calculator    0 0  Fall Risk Category (Retired)    Low   (RETIRED) Patient Fall Risk Level High fall risk High fall risk  High fall risk   Patient at Risk for Falls Due to    Impaired mobility   Fall risk Follow up    Falls evaluation completed    Functional Status Survey:    Vitals:   10/10/22 0958  BP: 124/64  Pulse: 71  Resp: (!) 30  Temp: (!) 97 F (36.1 C)  TempSrc: Temporal  SpO2: 100%  Weight: 137 lb (62.1 kg)  Height: 5\' 7"  (1.702 m)   Body mass index is 21.46 kg/m. Physical Exam Vitals and nursing note reviewed.  Constitutional:      General: He is not in acute distress.    Appearance: He is ill-appearing. He is not diaphoretic.     Comments: Eyes closed, able to respond  HENT:     Head: Normocephalic and atraumatic.     Nose: Nose normal.     Mouth/Throat:     Mouth: Mucous membranes are moist.     Pharynx: Oropharynx is clear.  Eyes:     Conjunctiva/sclera: Conjunctivae normal.     Pupils: Pupils are equal, round, and reactive to light.  Neck:     Thyroid: No thyromegaly.     Vascular: No JVD.     Trachea: No tracheal deviation.  Cardiovascular:     Rate and Rhythm: Normal rate.     Heart sounds: No murmur heard.    Comments: Regular with skipped meats Pulmonary:     Breath sounds: Wheezing (scattered bilat, intermittent)  present. No rales.     Comments: Increased work of breathing with accessory muscle use.  Abdominal:     General: Bowel sounds are normal. There is distension (mild).     Palpations: Abdomen is soft.     Tenderness: There is no abdominal tenderness.  Musculoskeletal:     Cervical back: Normal range of motion and neck supple.     Right lower leg: No edema.     Left lower leg: No edema.  Lymphadenopathy:     Cervical: No cervical adenopathy.  Skin:    General: Skin is warm and dry.     Findings: Bruising (to BUE) present.  Neurological:     Comments: Left sided weakness Able to say yes and now very softly.  Not following commands Eyes closed.  Furrow brow     Labs reviewed: Recent Labs    12/13/21 1157 12/14/21 0000 02/02/22 1202 03/07/22 0000 08/02/22 0000 08/10/22 1102 09/01/22 1123  NA  --    < > 145   < > 145 147* 144  K  --    < > 3.6   < > 3.8 3.8 3.4*  CL  --    < > 107   < > 108 107 108  CO2  --    < > 28   < > 23* 25 26  GLUCOSE  --    < >  126*  --   --  136* 158*  BUN  --    < > 32*   < > 30* 31* 29*  CREATININE  --    < > 1.14   < > 1.1 1.10 1.06  CALCIUM  --    < > 9.2   < > 9.1 9.4 9.0  MG 2.4  --   --   --   --   --  2.5*   < > = values in this interval not displayed.   Recent Labs    01/03/22 1322 02/02/22 1202 06/07/22 0000 08/10/22 1102  AST 25 22 33 34  ALT 20 23 42* 54*  ALKPHOS 100  --  107  --   BILITOT 1.1 0.9  --  1.0  PROT 7.4 7.1  --  7.3  ALBUMIN 3.6  --  4.2  --    Recent Labs    02/02/22 1202 03/14/22 0000 08/10/22 1102 09/01/22 1123  WBC 9.4 8.1 9.0 9.3  NEUTROABS 6,110  --  6,759  --   HGB 9.0* 10.3* 12.2* 11.7*  HCT 28.3* 32* 37.1* 36.7*  MCV 86.0  --  93.0 96.1  PLT 261 240 220 171   Lab Results  Component Value Date   TSH 1.74 07/05/2022   Lab Results  Component Value Date   HGBA1C 6.3 07/05/2022   Lab Results  Component Value Date   CHOL 98 06/14/2022   HDL 32 (A) 06/14/2022   LDLCALC 54 06/14/2022    TRIG 57 06/14/2022   CHOLHDL 2.8 08/08/2020    Significant Diagnostic Results in last 30 days:  No results found.  Assessment/Plan 1. COVID-19 virus infection Continue molnupiravir.  Amoxicillin on hold due to receiving augmentin and zithromax for interstitial pneumonitis associated with covid.  - dexamethasone (DECADRON) 2 MG tablet; Take 2 tablets (4 mg total) by mouth daily for 7 days.  Dispense: 14 tablet; Refill: 0  2. Chronic hypoxic respiratory failure (HCC) He intermittently requires oxygen due to prior hx of CHF, sleep apnea.  Currently on 2-3 liters, sats ok  3. 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 Controlled type 2 diabetes mellitus with complication, without long-term current use of insulin (HCC) Has been diet controlled Now on dexamethasone, will monitor CBG daily     Family/ staff Communication: discussed with his wife Nicole [facility]red. She would like to avoid hospitalizations. He is a DNR. They want to evaluate his need for hospitalization on an as needed basis.  Labs/tests ordered:  CBC with BMP in am

## 2022-10-11 DIAGNOSIS — I10 Essential (primary) hypertension: Secondary | ICD-10-CM | POA: Diagnosis not present

## 2022-10-15 DIAGNOSIS — I69354 Hemiplegia and hemiparesis following cerebral infarction affecting left non-dominant side: Secondary | ICD-10-CM | POA: Diagnosis not present

## 2022-10-15 DIAGNOSIS — R1312 Dysphagia, oropharyngeal phase: Secondary | ICD-10-CM | POA: Diagnosis not present

## 2022-10-15 DIAGNOSIS — G4089 Other seizures: Secondary | ICD-10-CM | POA: Diagnosis not present

## 2022-10-15 DIAGNOSIS — I5022 Chronic systolic (congestive) heart failure: Secondary | ICD-10-CM | POA: Diagnosis not present

## 2022-10-15 DIAGNOSIS — R41841 Cognitive communication deficit: Secondary | ICD-10-CM | POA: Diagnosis not present

## 2022-10-15 DIAGNOSIS — S065X3S Traumatic subdural hemorrhage with loss of consciousness of 1 hour to 5 hours 59 minutes, sequela: Secondary | ICD-10-CM | POA: Diagnosis not present

## 2022-10-17 ENCOUNTER — Other Ambulatory Visit: Payer: Self-pay | Admitting: Internal Medicine

## 2022-10-17 DIAGNOSIS — G4089 Other seizures: Secondary | ICD-10-CM | POA: Diagnosis not present

## 2022-10-17 DIAGNOSIS — R41841 Cognitive communication deficit: Secondary | ICD-10-CM | POA: Diagnosis not present

## 2022-10-17 DIAGNOSIS — I69354 Hemiplegia and hemiparesis following cerebral infarction affecting left non-dominant side: Secondary | ICD-10-CM | POA: Diagnosis not present

## 2022-10-17 DIAGNOSIS — R58 Hemorrhage, not elsewhere classified: Secondary | ICD-10-CM | POA: Diagnosis not present

## 2022-10-17 DIAGNOSIS — I5022 Chronic systolic (congestive) heart failure: Secondary | ICD-10-CM | POA: Diagnosis not present

## 2022-10-17 DIAGNOSIS — S065X3S Traumatic subdural hemorrhage with loss of consciousness of 1 hour to 5 hours 59 minutes, sequela: Secondary | ICD-10-CM | POA: Diagnosis not present

## 2022-10-17 DIAGNOSIS — R1312 Dysphagia, oropharyngeal phase: Secondary | ICD-10-CM | POA: Diagnosis not present

## 2022-10-17 MED ORDER — METHYLPHENIDATE HCL 5 MG PO TABS: 5.0000 mg | ORAL_TABLET | Freq: Two times a day (BID) | ORAL | 0 refills | Status: DC

## 2022-10-19 DIAGNOSIS — I69354 Hemiplegia and hemiparesis following cerebral infarction affecting left non-dominant side: Secondary | ICD-10-CM | POA: Diagnosis not present

## 2022-10-19 DIAGNOSIS — I5022 Chronic systolic (congestive) heart failure: Secondary | ICD-10-CM | POA: Diagnosis not present

## 2022-10-19 DIAGNOSIS — R41841 Cognitive communication deficit: Secondary | ICD-10-CM | POA: Diagnosis not present

## 2022-10-19 DIAGNOSIS — G4089 Other seizures: Secondary | ICD-10-CM | POA: Diagnosis not present

## 2022-10-19 DIAGNOSIS — S065X3S Traumatic subdural hemorrhage with loss of consciousness of 1 hour to 5 hours 59 minutes, sequela: Secondary | ICD-10-CM | POA: Diagnosis not present

## 2022-10-19 DIAGNOSIS — R1312 Dysphagia, oropharyngeal phase: Secondary | ICD-10-CM | POA: Diagnosis not present

## 2022-10-20 ENCOUNTER — Encounter: Payer: Self-pay | Admitting: Adult Health

## 2022-10-20 ENCOUNTER — Non-Acute Institutional Stay (SKILLED_NURSING_FACILITY): Payer: Medicare Other | Admitting: Adult Health

## 2022-10-20 DIAGNOSIS — J9611 Chronic respiratory failure with hypoxia: Secondary | ICD-10-CM

## 2022-10-20 DIAGNOSIS — J3489 Other specified disorders of nose and nasal sinuses: Secondary | ICD-10-CM | POA: Diagnosis not present

## 2022-10-20 DIAGNOSIS — E870 Hyperosmolality and hypernatremia: Secondary | ICD-10-CM | POA: Diagnosis not present

## 2022-10-20 DIAGNOSIS — U071 COVID-19: Secondary | ICD-10-CM | POA: Diagnosis not present

## 2022-10-20 DIAGNOSIS — J8489 Other specified interstitial pulmonary diseases: Secondary | ICD-10-CM | POA: Diagnosis not present

## 2022-10-20 MED ORDER — METHYLPHENIDATE HCL 5 MG PO TABS: 5.0000 mg | ORAL_TABLET | Freq: Two times a day (BID) | ORAL | 0 refills | Status: DC

## 2022-10-20 MED ORDER — AYR SALINE NASAL NA GEL
1.0000 | Freq: Two times a day (BID) | NASAL | Status: AC
Start: 2022-10-20 — End: ?

## 2022-10-20 NOTE — Progress Notes (Signed)
[facility]  Skilled care  Provider: Peggye Ley, ANP [facility] [phone]   Code Status: DNR Goals of Care:     10/10/2022    9:58 AM  Advanced Directives  Does Patient Have a Medical Advance Directive? Yes  Type of Estate agent of [place];Living will;Out of facility DNR (pink MOST or yellow form)  Does patient want to make changes to medical advance directive? No - Patient declined  Copy of Healthcare Power of Attorney in Chart? Yes - validated most recent copy scanned in chart (See row information)     Chief Complaint  Patient presents with   Acute Visit    F/u COVID and BUN elevated.     HPI: Patient is a 82 y.o. 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    Seen for lethargy and mild cough and diagnosed with Covid on 8/1.  Due to drug interactions he was given molnupiravir. Later he had some sob and mottling. CXR was done which showed interstitial pneumonitis. Order given for zithromax and augmentin.  He has been losing weight down to 132 lbs. From 137 lbs on 8/5.   His wife reports his intake has improved and they are pushing fluids.  He is sleeping a lot which is a chronic issue. Still has a lingering cough but no sputum production. No fever.  Remains on oxygen at 2 liters. Has periods of apnea and tachypnea similar to [facility] stokes breathing which is chronic. Currently on bipap.   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    Other and unspecified hyperlipidemia    Pre-diabetes    Previous back surgery 1978, jan 2007   Psychosexual dysfunction with inhibited sexual excitement    SOB (shortness of breath)    "with heavy exercise"   Stroke (HCC) 08/2013   "I WAS IN AFIB AND THREW A CLOT, THE EFFECTS WERE TRANSITORY AND DIDNT LAST BUT FOR 30 MINUTES"    Thoracic aortic aneurysm [facility])     Past Surgical History:  Procedure Laterality Date   AORTIC AND MITRAL VALVE REPLACEMENT     09/2004   CARDIOVERSION     3 times from 2004-2006   CARDIOVERSION N/A 09/26/2013   Procedure: CARDIOVERSION;  Surgeon: Laurey Morale, MD;  Location: [facility] ENDOSCOPY;  Service: Cardiovascular;  Laterality: N/A;   CARDIOVERSION N/A 06/19/2014   Procedure: CARDIOVERSION;  Surgeon: Jake Bathe, MD;  Location: [facility] ENDOSCOPY;  Service: Cardiovascular;  Laterality: N/A;   CARDIOVERSION N/A 04/24/2017   Procedure: CARDIOVERSION;  Surgeon: Laurey Morale, MD;  Location: [facility] ENDOSCOPY;  Service: Cardiovascular;  Laterality: N/A;   CARDIOVERSION N/A 06/08/2017   Procedure: CARDIOVERSION;  Surgeon: Chrystie Nose, MD;  Location: [facility] ENDOSCOPY;  Service: Cardiovascular;  Laterality: N/A;   CARDIOVERSION N/A 08/24/2017   Procedure: CARDIOVERSION;  Surgeon: Chilton Si, MD;  Location: [facility] ENDOSCOPY;  Service: Cardiovascular;  Laterality: N/A;   CARDIOVERSION N/A 02/14/2018   Procedure: CARDIOVERSION;  Surgeon: Laurey Morale, MD;  Location: [facility] ENDOSCOPY;  Service: Cardiovascular;  Laterality: N/A;   COLONOSCOPY     CRANIOTOMY N/A 07/28/2020   Procedure: CRANIOTOMY FOR EVACUATION OF SUBDURAL HEMATOMA;  Surgeon: Tia Alert, MD;   Location: [facility] OR;  Service: Neurosurgery;  Laterality: N/A;   IR ANGIO VERTEBRAL SEL SUBCLAVIAN INNOMINATE UNI R MOD SED  08/07/2020   IR CT HEAD LTD  08/07/2020   IR PERCUTANEOUS ART THROMBECTOMY/INFUSION INTRACRANIAL INC DIAG ANGIO  08/07/2020   laminectomies     10/1975 and in 03/2005   RADIOLOGY WITH ANESTHESIA N/A 08/07/2020   Procedure: IR WITH ANESTHESIA;  Surgeon: Julieanne Cotton, MD;  Location: [facility] OR;  Service: Radiology;  Laterality: N/A;   TEE WITHOUT CARDIOVERSION N/A 12/18/2020   Procedure: TRANSESOPHAGEAL ECHOCARDIOGRAM (TEE);  Surgeon: Laurey Morale, MD;  Location: [facility] ENDOSCOPY;  Service: Cardiovascular;  Laterality: N/A;   TONSILLECTOMY     1950   TOTAL HIP ARTHROPLASTY Right 04/03/2018   Procedure: TOTAL HIP ARTHROPLASTY ANTERIOR APPROACH;  Surgeon: Durene Romans, MD;  Location: [facility] ORS;  Service: Orthopedics;  Laterality: Right;  70 mins    Allergies  Allergen Reactions   Naproxen Hives   No [employer] Food Allergies Other (See Comments)    Scallops - distress, nausea and vomitting    Outpatient Encounter Medications as of 10/20/2022  Medication Sig   acetaminophen (TYLENOL) 325 MG tablet Take 650 mg by mouth as needed for mild pain.   Amantadine HCl 100 MG tablet Take 100 mg by mouth 2 (two) times daily.   amoxicillin (AMOXIL) 500 MG capsule Take 500 mg by mouth 2 (two) times daily.   apixaban (ELIQUIS) 5 MG TABS tablet Take 1 tablet (5 mg total) by mouth 2 (two) times daily.   Artificial Saliva (BIOTENE MOISTURIZING MOUTH MT) Use as directed 2 sprays in the mouth or throat in the morning, at noon, and at bedtime.   atorvastatin (LIPITOR) 40 MG tablet Take 40 mg by mouth daily.   brivaracetam (BRIVIACT) 10 MG/ML solution Take 5 mLs (50 mg total) by mouth 2 (two) times daily.   diazePAM, 15 MG Dose, (VALTOCO 15 MG DOSE) 2 x 7.5 MG/0.1ML LQPK Place 7.5 mg into each nostril (total of 15 mg) for grand mal seizure lasting over 2 minutes. Can repeat once in 4 hours. Do Not use  more than twice in 24 hours   dofetilide (TIKOSYN) 125 MCG capsule Take 3 capsules (375 mcg total) by mouth 2 (two) times daily.   finasteride (PROSCAR) 5 MG tablet Take 1 tablet (5 mg total) by mouth daily.   lacosamide (VIMPAT) 10 MG/ML oral solution Take 50 mg by mouth 2 (two) times daily.   magnesium gluconate (MAGONATE) 500 MG tablet Take 1 tablet (500 mg total) by mouth daily.   melatonin 5 MG TABS Take 5 mg by mouth at bedtime.   methenamine (MANDELAMINE) 1 g tablet Take 1,000 mg by mouth 2 (two) times daily.   methylphenidate (RITALIN) 5 MG tablet Take 1 tablet (5 mg total) by mouth 2 (two) times daily. Take at 7 am  and 12 pm   Multiple Vitamin (MULTIVITAMIN WITH MINERALS) TABS tablet Take 1 tablet by mouth daily.   omeprazole (PRILOSEC) 20 MG capsule Take 20 mg by mouth daily.   OXYGEN Inhale into the lungs as directed. Oxygen at 2L [place] - titrate for sat > or = to 90%; May leave on for SOB Every Shift - PRN; PRN 1, PRN 2, PRN 3   Polyethyl Glycol-Propyl Glycol (SYSTANE) 0.4-0.3 % SOLN Place 2 drops into both eyes daily.   polyethylene glycol (MIRALAX / GLYCOLAX) 17 g packet Take 17 g by mouth daily.   potassium chloride (KLOR-CON M) 10 MEQ tablet Take 20 mEq by mouth 2 (two) times daily. in the am and 20 meq at noon   saline (AYR) GEL Place 1 Application into both nostrils in the morning and at bedtime.   torsemide (DEMADEX) 20 MG tablet Take 20 mg by mouth 2 (two) times daily. 20 mg in the am and 20 mg at noon   UNABLE TO FIND BiPAP  Apply BiPAP @ QHS, Make sure no water or moisture in hose (use spare hose if necessary) Reapply if taken off. Special Instructions: Check hose for moisture/water, if present use spare hose [DX: Obstructive sleep apnea (adult) (pediatric)] At Bedtime; 09:00 PM - 10:00 PM   [DISCONTINUED] methylphenidate (RITALIN) 5 MG tablet Take 1 tablet (5 mg total) by mouth 2 (two) times daily. Take at 7 am and 12 pm   No facility-administered encounter  medications on file as of 10/20/2022.    Review of Systems:  Review of Systems  Unable to perform ROS: Patient nonverbal    Health Maintenance  Topic Date Due   Medicare Annual Wellness (AWV)  07/10/2020   COVID-19 Vaccine (5 - 2023-24 season) 03/07/2022   INFLUENZA VACCINE  10/06/2022   OPHTHALMOLOGY EXAM  02/05/2023 (Originally 10/04/1950)   Diabetic kidney evaluation - Urine ACR  07/01/2027 (Originally 10/04/1958)   FOOT EXAM  10/23/2022   HEMOGLOBIN A1C  01/04/2023   Diabetic kidney evaluation - eGFR measurement  09/01/2023   DTaP/Tdap/Td (4 - Td or Tdap) 03/22/2028   Pneumonia Vaccine 46+ Years old  Completed   Zoster Vaccines- Shingrix  Completed   HPV VACCINES  Aged Out   Colonoscopy  Discontinued    Physical Exam: Vitals:   10/20/22 1450  BP: 117/79  Pulse: 64  Resp: 20  Temp: 98.9 F (37.2 C)  SpO2: 94%  Weight: 132 lb 3.2 oz (60 kg)   Body mass index is 20.71 kg/m. Physical Exam Vitals and nursing note reviewed.  Constitutional:      General: He is not in acute distress.    Appearance: He is not diaphoretic.  HENT:     Head: Normocephalic and atraumatic.     Nose:     Comments: Erythema, purulent matter in right nare, mild erythema and irritation.     Mouth/Throat:     Mouth: Mucous membranes are moist.     Pharynx: Oropharynx is clear.  Neck:     Thyroid: No thyromegaly.     Vascular: No JVD.     Trachea: No tracheal deviation.  Cardiovascular:     Rate and Rhythm: Normal rate. Rhythm irregular.     Heart sounds: No murmur heard. Pulmonary:     Effort: Pulmonary effort is normal. No respiratory distress.     Breath sounds: Normal breath sounds. No wheezing.  Abdominal:     General: Bowel sounds are normal. There is no distension.  Palpations: Abdomen is soft.     Tenderness: There is no abdominal tenderness.  Musculoskeletal:     Right lower leg: No edema.     Left lower leg: No edema.  Lymphadenopathy:     Cervical: No cervical  adenopathy.  Skin:    General: Skin is warm and dry.  Neurological:     Comments: Asleep, difficult to arouse but does respond to physical stimulus Left sided weakness.      Labs reviewed: Basic Metabolic Panel: Recent Labs    12/13/21 1157 12/14/21 0000 02/02/22 1202 03/07/22 0000 07/05/22 0000 08/02/22 0000 08/10/22 1102 09/01/22 1123  NA  --    < > 145   < > 148* 145 147* 144  K  --    < > 3.6   < > 4.1 3.8 3.8 3.4*  CL  --    < > 107   < > 106 108 107 108  CO2  --    < > 28   < > 24* 23* 25 26  GLUCOSE  --    < > 126*  --   --   --  136* 158*  BUN  --    < > 32*   < > 24* 30* 31* 29*  CREATININE  --    < > 1.14   < > 1.0 1.1 1.10 1.06  CALCIUM  --    < > 9.2   < > 9.4 9.1 9.4 9.0  MG 2.4  --   --   --   --   --   --  2.5*  TSH  --   --   --   --  1.74  --   --   --    < > = values in this interval not displayed.   Liver Function Tests: Recent Labs    01/03/22 1322 02/02/22 1202 06/07/22 0000 08/10/22 1102  AST 25 22 33 34  ALT 20 23 42* 54*  ALKPHOS 100  --  107  --   BILITOT 1.1 0.9  --  1.0  PROT 7.4 7.1  --  7.3  ALBUMIN 3.6  --  4.2  --    No results for input(s): "LIPASE", "AMYLASE" in the last 8760 hours. No results for input(s): "AMMONIA" in the last 8760 hours. CBC: Recent Labs    02/02/22 1202 03/14/22 0000 08/10/22 1102 09/01/22 1123  WBC 9.4 8.1 9.0 9.3  NEUTROABS 6,110  --  6,759  --   HGB 9.0* 10.3* 12.2* 11.7*  HCT 28.3* 32* 37.1* 36.7*  MCV 86.0  --  93.0 96.1  PLT 261 240 220 171   Lipid Panel: Recent Labs    06/14/22 0000  CHOL 98  HDL 32*  LDLCALC 54  TRIG 57   Lab Results  Component Value Date   HGBA1C 6.3 07/05/2022    Procedures since last visit: No results found.  Assessment/Plan 1. COVID-19 virus infection Completed molnupiravir. Has some lingering cough but oxygen requirement and breathing pattern are at baseline.   2. Interstitial pneumonitis (HCC) Noted on CXR after Covid diagnosis Completed zithromax  and augmentin per oncall provider  Did receive dexamethasone  F/U CXR 11/29/22  3. Hypernatremia Mild at 147 with some weight loss and decreased intake due to Covid Will hold torsemide for two doses then resume Also encourage fluid intake.  Continue daily weights.   4. Nasal dryness  - saline (AYR) GEL; Place 1 Application into both nostrils in  the morning and at bedtime.  5. Chronic hypoxic respiratory failure (HCC) Continues on 2 liters [place] unchanged

## 2022-10-21 DIAGNOSIS — I69354 Hemiplegia and hemiparesis following cerebral infarction affecting left non-dominant side: Secondary | ICD-10-CM | POA: Diagnosis not present

## 2022-10-21 DIAGNOSIS — I5022 Chronic systolic (congestive) heart failure: Secondary | ICD-10-CM | POA: Diagnosis not present

## 2022-10-21 DIAGNOSIS — S065X3S Traumatic subdural hemorrhage with loss of consciousness of 1 hour to 5 hours 59 minutes, sequela: Secondary | ICD-10-CM | POA: Diagnosis not present

## 2022-10-21 DIAGNOSIS — R1312 Dysphagia, oropharyngeal phase: Secondary | ICD-10-CM | POA: Diagnosis not present

## 2022-10-21 DIAGNOSIS — R41841 Cognitive communication deficit: Secondary | ICD-10-CM | POA: Diagnosis not present

## 2022-10-21 DIAGNOSIS — G4089 Other seizures: Secondary | ICD-10-CM | POA: Diagnosis not present

## 2022-10-24 DIAGNOSIS — I5022 Chronic systolic (congestive) heart failure: Secondary | ICD-10-CM | POA: Diagnosis not present

## 2022-10-24 DIAGNOSIS — R41841 Cognitive communication deficit: Secondary | ICD-10-CM | POA: Diagnosis not present

## 2022-10-24 DIAGNOSIS — I10 Essential (primary) hypertension: Secondary | ICD-10-CM | POA: Diagnosis not present

## 2022-10-24 DIAGNOSIS — G4089 Other seizures: Secondary | ICD-10-CM | POA: Diagnosis not present

## 2022-10-24 DIAGNOSIS — R1312 Dysphagia, oropharyngeal phase: Secondary | ICD-10-CM | POA: Diagnosis not present

## 2022-10-24 DIAGNOSIS — S065X3S Traumatic subdural hemorrhage with loss of consciousness of 1 hour to 5 hours 59 minutes, sequela: Secondary | ICD-10-CM | POA: Diagnosis not present

## 2022-10-24 DIAGNOSIS — I69354 Hemiplegia and hemiparesis following cerebral infarction affecting left non-dominant side: Secondary | ICD-10-CM | POA: Diagnosis not present

## 2022-10-26 ENCOUNTER — Encounter: Payer: Self-pay | Admitting: Physical Medicine & Rehabilitation

## 2022-10-26 ENCOUNTER — Encounter: Payer: Medicare Other | Attending: Physical Medicine & Rehabilitation | Admitting: Physical Medicine & Rehabilitation

## 2022-10-26 VITALS — BP 111/74 | HR 72 | Ht 67.0 in | Wt 132.0 lb

## 2022-10-26 DIAGNOSIS — I69354 Hemiplegia and hemiparesis following cerebral infarction affecting left non-dominant side: Secondary | ICD-10-CM | POA: Diagnosis not present

## 2022-10-26 DIAGNOSIS — R419 Unspecified symptoms and signs involving cognitive functions and awareness: Secondary | ICD-10-CM

## 2022-10-26 DIAGNOSIS — R41841 Cognitive communication deficit: Secondary | ICD-10-CM | POA: Diagnosis not present

## 2022-10-26 DIAGNOSIS — G8114 Spastic hemiplegia affecting left nondominant side: Secondary | ICD-10-CM | POA: Diagnosis not present

## 2022-10-26 DIAGNOSIS — S069X3D Unspecified intracranial injury with loss of consciousness of 1 hour to 5 hours 59 minutes, subsequent encounter: Secondary | ICD-10-CM | POA: Diagnosis not present

## 2022-10-26 DIAGNOSIS — S065X3S Traumatic subdural hemorrhage with loss of consciousness of 1 hour to 5 hours 59 minutes, sequela: Secondary | ICD-10-CM | POA: Diagnosis not present

## 2022-10-26 DIAGNOSIS — G4089 Other seizures: Secondary | ICD-10-CM | POA: Diagnosis not present

## 2022-10-26 DIAGNOSIS — R1312 Dysphagia, oropharyngeal phase: Secondary | ICD-10-CM | POA: Diagnosis not present

## 2022-10-26 DIAGNOSIS — I5022 Chronic systolic (congestive) heart failure: Secondary | ICD-10-CM | POA: Diagnosis not present

## 2022-10-26 MED ORDER — METHYLPHENIDATE HCL 10 MG PO TABS
10.0000 mg | ORAL_TABLET | Freq: Two times a day (BID) | ORAL | 0 refills | Status: DC
Start: 2022-10-26 — End: 2022-11-21

## 2022-10-26 NOTE — Patient Instructions (Signed)
ALWAYS FEEL FREE TO CALL OUR OFFICE WITH ANY PROBLEMS OR QUESTIONS ([phone])  **PLEASE NOTE** ALL MEDICATION REFILL REQUESTS (INCLUDING CONTROLLED SUBSTANCES) NEED TO BE MADE AT LEAST 7 DAYS PRIOR TO REFILL BEING DUE. ANY REFILL REQUESTS INSIDE THAT TIME FRAME MAY RESULT IN DELAYS IN RECEIVING YOUR PRESCRIPTION.                    

## 2022-10-26 NOTE — Progress Notes (Signed)
Subjective:    Patient ID: [name] [name], male    DOB: 03-21-1940, 82 y.o.   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 We performed botox at his last visit in May to his left biceps and finger flexors. He came down with COVID last month which really has taken the starch out of him. He is just starting to be more alert.   He remains on vimpat and briviact per neurology. Vimpat is being tapered.   He continues to see therapy at [facility]. They have been working on his tone and rom.     Pain Inventory Average Pain  n/a Pain Right Now  na My pain is  na  Mobility use a wheelchair needs help with transfers  Function disabled: date disabled   retired I need assistance with the following:  feeding, dressing, bathing, toileting, meal prep, household duties, and shopping  Neuro/Psych bladder control problems  Prior Studies Any changes since last visit?  yes resppiratory infections  Physicians involved in your care Any changes since last visit?  no   Family History  Problem Relation Age of Onset   Macular degeneration Mother        macular degeneration   Cancer Father        intestinal/GI   Diabetes Paternal Aunt    Heart attack Maternal Grandfather    Diabetes Brother    Social History   Socioeconomic History   Marital status: Married    Spouse name: [name] [name]   Number of children: 2   Years of education: BS   Highest education level: Not on file  Occupational History   Occupation: retired  Tobacco Use   Smoking status: Never   Smokeless tobacco: Never  Vaping Use   Vaping status: Never Used  Substance and Sexual Activity   Alcohol use: Never    Comment: quit 5/22   Drug use: Never   Sexual activity: Not Currently    Partners: Female  Other Topics Concern   Not on file  Social History Narrative   10/27/20 residing at [facility] care center since 09/17/20    [employer] - BS, JD. married - '67. 2 sons - '74, '77  one son is a Oceanographer for [employer] - [employer]; 4 grandchildren. 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 marriage in good health. End-of-life: discussed issues and provided packet with the MOST form and out of facility order.      Caffeine: decaf coffee   Social Determinants of Health   Financial Resource Strain: Low Risk  (09/13/2018)   Received from [facility], [facility]   Overall Financial Resource Strain (CARDIA)    Difficulty of Paying Living Expenses: Not hard at all  Food Insecurity: No Food Insecurity (09/13/2018)   Received from [facility], [facility]   Hunger Vital Sign    Worried About Running Out of Food in the Last Year: Never true    Ran Out of Food in the Last Year: Never true  Transportation Needs: No Transportation Needs (09/13/2018)   Received from [facility], [facility] - Transportation    Lack of Transportation (Medical): No    Lack of Transportation (Non-Medical): No  Physical Activity: Not on file  Stress: Not on file  Social Connections: Not on file   Past Surgical History:  Procedure  Laterality Date   AORTIC AND MITRAL VALVE REPLACEMENT     09/2004   CARDIOVERSION     3 times from 2004-2006   CARDIOVERSION N/A 09/26/2013   Procedure: CARDIOVERSION;  Surgeon: Laurey Morale, MD;  Location: [facility] ENDOSCOPY;  Service: Cardiovascular;  Laterality: N/A;   CARDIOVERSION N/A 06/19/2014   Procedure: CARDIOVERSION;  Surgeon: Jake Bathe, MD;  Location: [facility] ENDOSCOPY;  Service: Cardiovascular;  Laterality: N/A;   CARDIOVERSION N/A 04/24/2017   Procedure: CARDIOVERSION;  Surgeon: Laurey Morale, MD;  Location: [facility] ENDOSCOPY;  Service: Cardiovascular;  Laterality: N/A;   CARDIOVERSION N/A 06/08/2017   Procedure: CARDIOVERSION;  Surgeon: Chrystie Nose, MD;  Location: [facility] ENDOSCOPY;  Service: Cardiovascular;   Laterality: N/A;   CARDIOVERSION N/A 08/24/2017   Procedure: CARDIOVERSION;  Surgeon: Chilton Si, MD;  Location: [name] ENDOSCOPY;  Service: Cardiovascular;  Laterality: N/A;   CARDIOVERSION N/A 02/14/2018   Procedure: CARDIOVERSION;  Surgeon: Laurey Morale, MD;  Location: [facility] ENDOSCOPY;  Service: Cardiovascular;  Laterality: N/A;   COLONOSCOPY     CRANIOTOMY N/A 07/28/2020   Procedure: CRANIOTOMY FOR EVACUATION OF SUBDURAL HEMATOMA;  Surgeon: Tia Alert, MD;  Location: [facility] OR;  Service: Neurosurgery;  Laterality: N/A;   IR ANGIO VERTEBRAL SEL SUBCLAVIAN INNOMINATE UNI R MOD SED  08/07/2020   IR CT HEAD LTD  08/07/2020   IR PERCUTANEOUS ART THROMBECTOMY/INFUSION INTRACRANIAL INC DIAG ANGIO  08/07/2020   laminectomies     10/1975 and in 03/2005   RADIOLOGY WITH ANESTHESIA N/A 08/07/2020   Procedure: IR WITH ANESTHESIA;  Surgeon: Julieanne Cotton, MD;  Location: [facility] OR;  Service: Radiology;  Laterality: N/A;   TEE WITHOUT CARDIOVERSION N/A 12/18/2020   Procedure: TRANSESOPHAGEAL ECHOCARDIOGRAM (TEE);  Surgeon: Laurey Morale, MD;  Location: [facility] ENDOSCOPY;  Service: Cardiovascular;  Laterality: N/A;   TONSILLECTOMY     1950   TOTAL HIP ARTHROPLASTY Right 04/03/2018   Procedure: TOTAL HIP ARTHROPLASTY ANTERIOR APPROACH;  Surgeon: Durene Romans, MD;  Location: [facility] ORS;  Service: Orthopedics;  Laterality: Right;  70 mins   Past Medical History:  Diagnosis Date   Acute rheumatic heart disease, unspecified    childhood, age  34 & 10   Acute rheumatic pericarditis    Atrial fibrillation (HCC)    history   CHF (congestive heart failure) (HCC)    Diverticulosis    Dysrhythmia    Enlarged aorta (HCC) 2019   External hemorrhoids without mention of complication    H/O aortic valve replacement    H/O mitral valve replacement    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   Lesion of ulnar nerve    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  Opioid Risk Score:   Fall Risk Score:  `1  Depression screen [facility] 2/9     10/13/2021    2:32 PM 01/18/2021    3:14 PM 07/11/2019    1:36 PM 06/27/2018    2:47 PM 05/23/2017  7:55 AM 05/22/2017   10:19 AM 05/19/2016   10:36 AM  Depression screen PHQ 2/9  Decreased Interest 0 0 0 0 0 0 0  Down, Depressed, Hopeless 0 0 0 0 0 0 0  PHQ - 2 Score 0 0 0 0 0 0 0     Review of Systems  Constitutional: Negative.   HENT: Negative.    Eyes: Negative.   Respiratory:         Has had pneumonia  Cardiovascular: Negative.   Gastrointestinal: Negative.   Endocrine: Negative.   Genitourinary:        Bladder control  Musculoskeletal:        Spasticity  Skin: Negative.   Allergic/Immunologic: Negative.   Neurological: Negative.   Hematological:  Bruises/bleeds easily.       Tikosyn  Psychiatric/Behavioral: Negative.    All other systems reviewed and are negative.      Objective:   Physical Exam  General: No acute distress HEENT: NCAT, EOMI, oral membranes moist Cards: reg rate  Chest: normal effort Abdomen: Soft, NT, ND Skin: dry, intact Extremities: no edema Psych: pleasant and appropriate  Skin: intact.  Neuro:fairly alert. Non-verbal.  .  Patient with 1  out of 4 tone in the left wrist   flexors and 2-3/4 finger flexors/lumbricals, biceps trace.  Hamstrings are 1+ ut of 4.  He does have movement in the left upper extremity at 1+ to 2 out of 5.  Left lower extremity similar in 1-2 out of 5 but inconsistent.  He engages better with the right side and seems to move both fairly freely but does have some processing and  motor planning issues there even. Musculoskeletal: limited posture assessment due to positioning in bed         Assessment & Plan:    Medical Problem List and Plan: 1.  Lethargy and L hemiparesis with cognitive impairments secondary to R MCA stroke s/p previous/recent crani for R>>L SDHs             -Continue PT OT and speech therapy at skilled nursing facility  -return for botox 400u to left wrist and HI 3. Sleep/wake, associated lethargy -continue amantadine 100mg  bid -increase ritalin to 10mg  bid at 0700 and 1200 -Has sleep assessment scheduled with neurology next week 4. Seizure prophylaxis Vimpat taper and briviact per neurology 5. BPH/urine retention -tikosyn, finasteride, flomax per urology -Avoid use of Foley if possible. 6.  Nutrition: Encourage fluids.  Recent BUN was increased.  He is also on chronic diuretic as needed which should be monitored       Twenty minutes of face to face patient care time were spent during this visit. All questions were encouraged and answered.  Follow up with me in 2-4 weeks .

## 2022-10-28 DIAGNOSIS — R1312 Dysphagia, oropharyngeal phase: Secondary | ICD-10-CM | POA: Diagnosis not present

## 2022-10-28 DIAGNOSIS — I69354 Hemiplegia and hemiparesis following cerebral infarction affecting left non-dominant side: Secondary | ICD-10-CM | POA: Diagnosis not present

## 2022-10-28 DIAGNOSIS — G4089 Other seizures: Secondary | ICD-10-CM | POA: Diagnosis not present

## 2022-10-28 DIAGNOSIS — R41841 Cognitive communication deficit: Secondary | ICD-10-CM | POA: Diagnosis not present

## 2022-10-28 DIAGNOSIS — I5022 Chronic systolic (congestive) heart failure: Secondary | ICD-10-CM | POA: Diagnosis not present

## 2022-10-28 DIAGNOSIS — S065X3S Traumatic subdural hemorrhage with loss of consciousness of 1 hour to 5 hours 59 minutes, sequela: Secondary | ICD-10-CM | POA: Diagnosis not present

## 2022-10-31 DIAGNOSIS — I69354 Hemiplegia and hemiparesis following cerebral infarction affecting left non-dominant side: Secondary | ICD-10-CM | POA: Diagnosis not present

## 2022-10-31 DIAGNOSIS — I5022 Chronic systolic (congestive) heart failure: Secondary | ICD-10-CM | POA: Diagnosis not present

## 2022-10-31 DIAGNOSIS — R1312 Dysphagia, oropharyngeal phase: Secondary | ICD-10-CM | POA: Diagnosis not present

## 2022-10-31 DIAGNOSIS — R41841 Cognitive communication deficit: Secondary | ICD-10-CM | POA: Diagnosis not present

## 2022-10-31 DIAGNOSIS — S065X3S Traumatic subdural hemorrhage with loss of consciousness of 1 hour to 5 hours 59 minutes, sequela: Secondary | ICD-10-CM | POA: Diagnosis not present

## 2022-10-31 DIAGNOSIS — G4089 Other seizures: Secondary | ICD-10-CM | POA: Diagnosis not present

## 2022-11-01 DIAGNOSIS — I10 Essential (primary) hypertension: Secondary | ICD-10-CM | POA: Diagnosis not present

## 2022-11-01 DIAGNOSIS — I38 Endocarditis, valve unspecified: Secondary | ICD-10-CM | POA: Diagnosis not present

## 2022-11-01 LAB — BASIC METABOLIC PANEL
BUN: 36 — AB (ref 4–21)
CO2: 24 — AB (ref 13–22)
Chloride: 108 (ref 99–108)
Creatinine: 1.1 (ref 0.6–1.3)
Glucose: 111
Potassium: 4 mEq/L (ref 3.5–5.1)
Sodium: 145 (ref 137–147)

## 2022-11-01 LAB — COMPREHENSIVE METABOLIC PANEL: Calcium: 8.6 — AB (ref 8.7–10.7)

## 2022-11-02 DIAGNOSIS — R1312 Dysphagia, oropharyngeal phase: Secondary | ICD-10-CM | POA: Diagnosis not present

## 2022-11-02 DIAGNOSIS — S065X3S Traumatic subdural hemorrhage with loss of consciousness of 1 hour to 5 hours 59 minutes, sequela: Secondary | ICD-10-CM | POA: Diagnosis not present

## 2022-11-02 DIAGNOSIS — R41841 Cognitive communication deficit: Secondary | ICD-10-CM | POA: Diagnosis not present

## 2022-11-02 DIAGNOSIS — I5022 Chronic systolic (congestive) heart failure: Secondary | ICD-10-CM | POA: Diagnosis not present

## 2022-11-02 DIAGNOSIS — G4089 Other seizures: Secondary | ICD-10-CM | POA: Diagnosis not present

## 2022-11-02 DIAGNOSIS — I69354 Hemiplegia and hemiparesis following cerebral infarction affecting left non-dominant side: Secondary | ICD-10-CM | POA: Diagnosis not present

## 2022-11-03 ENCOUNTER — Non-Acute Institutional Stay: Payer: Self-pay | Admitting: Adult Health

## 2022-11-03 DIAGNOSIS — R1312 Dysphagia, oropharyngeal phase: Secondary | ICD-10-CM | POA: Diagnosis not present

## 2022-11-03 DIAGNOSIS — I10 Essential (primary) hypertension: Secondary | ICD-10-CM | POA: Diagnosis not present

## 2022-11-03 DIAGNOSIS — R5383 Other fatigue: Secondary | ICD-10-CM

## 2022-11-04 DIAGNOSIS — R41841 Cognitive communication deficit: Secondary | ICD-10-CM | POA: Diagnosis not present

## 2022-11-04 DIAGNOSIS — R1312 Dysphagia, oropharyngeal phase: Secondary | ICD-10-CM | POA: Diagnosis not present

## 2022-11-04 DIAGNOSIS — G4089 Other seizures: Secondary | ICD-10-CM | POA: Diagnosis not present

## 2022-11-04 DIAGNOSIS — I5022 Chronic systolic (congestive) heart failure: Secondary | ICD-10-CM | POA: Diagnosis not present

## 2022-11-04 DIAGNOSIS — I69354 Hemiplegia and hemiparesis following cerebral infarction affecting left non-dominant side: Secondary | ICD-10-CM | POA: Diagnosis not present

## 2022-11-04 DIAGNOSIS — S065X3S Traumatic subdural hemorrhage with loss of consciousness of 1 hour to 5 hours 59 minutes, sequela: Secondary | ICD-10-CM | POA: Diagnosis not present

## 2022-11-05 ENCOUNTER — Encounter: Payer: Self-pay | Admitting: Adult Health

## 2022-11-05 ENCOUNTER — Telehealth: Payer: Medicare Other | Admitting: Orthopedic Surgery

## 2022-11-05 NOTE — Progress Notes (Signed)
[facility] [employer]  [facility]  Provider:  Peggye [name], ANP [facility] [phone]   Code Status: DNR Goals of Care:     10/10/2022    9:58 AM  Advanced Directives  Does Patient Have a Medical Advance Directive? Yes  Type of Estate agent of [place];Living will;Out of facility DNR (pink MOST or yellow form)  Does patient want to make changes to medical advance directive? No - Patient declined  Copy of Healthcare Power of Attorney in Chart? Yes - validated most recent copy scanned in chart (See row information)     Chief Complaint  Patient presents with   Acute Visit    Concern for lethargy and soft bp    HPI: Patient is a 81 y.o. 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   Ms. [name] [name] request that I see [name] [name] today due to lethargy, soft bp, and dysphagia. Over time [name] [name] has been talking less, sleeping more, no longer using hand signals, and now is requiring a puree diet for concern for aspiration. He is currently working with ST.   BP this am on the automatic machine was in the 70s systolic but when taken manual was 110/68. The nurse reports it was difficulty to auscultate. I also took his BP and found it difficult to hear, 110/68. Weight is stable. He at 76-100% of lunch. Needs to be encouraged to drink water. BMP showed NA 145 11/01/22.   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   Lesion of ulnar nerve    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    Other and unspecified hyperlipidemia    Pre-diabetes    Previous back surgery 1978, jan 2007   Psychosexual dysfunction with inhibited sexual excitement    SOB (shortness of breath)    "with heavy exercise"   Stroke (HCC) 08/2013   "I WAS IN AFIB AND THREW A CLOT, THE EFFECTS WERE TRANSITORY AND DIDNT LAST BUT FOR 30 MINUTES"    Thoracic aortic aneurysm [facility])     Past Surgical History:  Procedure Laterality Date   AORTIC AND MITRAL VALVE REPLACEMENT     09/2004   CARDIOVERSION     3 times from 2004-2006   CARDIOVERSION N/A 09/26/2013   Procedure: CARDIOVERSION;  Surgeon: Laurey Morale, MD;  Location: [facility] ENDOSCOPY;  Service: Cardiovascular;  Laterality: N/A;   CARDIOVERSION N/A 06/19/2014   Procedure: CARDIOVERSION;  Surgeon: Jake Bathe, MD;  Location: [facility] ENDOSCOPY;  Service: Cardiovascular;  Laterality: N/A;   CARDIOVERSION N/A 04/24/2017   Procedure: CARDIOVERSION;  Surgeon: [name]  S, MD;  Location: [facility] ENDOSCOPY;  Service: Cardiovascular;  Laterality: N/A;   CARDIOVERSION N/A 06/08/2017   Procedure: CARDIOVERSION;  Surgeon: Chrystie Nose, MD;  Location: [facility] ENDOSCOPY;  Service: Cardiovascular;  Laterality: N/A;   CARDIOVERSION N/A 08/24/2017   Procedure: CARDIOVERSION;  Surgeon: Chilton Si, MD;  Location: [facility] ENDOSCOPY;  Service: Cardiovascular;  Laterality: N/A;   CARDIOVERSION N/A 02/14/2018   Procedure: CARDIOVERSION;  Surgeon: Laurey Morale, MD;  Location: [facility] ENDOSCOPY;  Service: Cardiovascular;  Laterality: N/A;   COLONOSCOPY     CRANIOTOMY N/A 07/28/2020   Procedure: CRANIOTOMY FOR EVACUATION OF SUBDURAL HEMATOMA;  Surgeon: Tia Alert, MD;  Location:  [facility] OR;  Service: Neurosurgery;  Laterality: N/A;   IR ANGIO VERTEBRAL SEL SUBCLAVIAN INNOMINATE UNI R MOD SED  08/07/2020   IR CT HEAD LTD  08/07/2020   IR PERCUTANEOUS ART THROMBECTOMY/INFUSION INTRACRANIAL INC DIAG ANGIO  08/07/2020   laminectomies     10/1975 and in 03/2005   RADIOLOGY WITH ANESTHESIA N/A 08/07/2020   Procedure: IR WITH ANESTHESIA;  Surgeon: Julieanne Cotton, MD;  Location[facility] OR;  Service: Radiology;  Laterality: N/A;   TEE WITHOUT CARDIOVERSION N/A 12/18/2020   Procedure: TRANSESOPHAGEAL ECHOCARDIOGRAM (TEE);  Surgeon: Laurey Morale, MD;  Location: [facility] ENDOSCOPY;  Service: Cardiovascular;  Laterality: N/A;   TONSILLECTOMY     1950   TOTAL HIP ARTHROPLASTY Right 04/03/2018   Procedure: TOTAL HIP ARTHROPLASTY ANTERIOR APPROACH;  Surgeon: Durene Romans, MD;  Location: [facility] ORS;  Service: Orthopedics;  Laterality: Right;  70 mins    Allergies  Allergen Reactions   Naproxen Hives   No [employer] Food Allergies Other (See Comments)    Scallops - distress, nausea and vomitting    Outpatient Encounter Medications as of 11/03/2022  Medication Sig   acetaminophen (TYLENOL) 325 MG tablet Take 650 mg by mouth as needed for mild pain.   Amantadine HCl 100 MG tablet Take 100 mg by mouth 2 (two) times daily.   amoxicillin (AMOXIL) 500 MG capsule Take 500 mg by mouth 2 (two) times daily.   apixaban (ELIQUIS) 5 MG TABS tablet Take 1 tablet (5 mg total) by mouth 2 (two) times daily.   Artificial Saliva (BIOTENE MOISTURIZING MOUTH MT) Use as directed 2 sprays in the mouth or throat in the morning, at noon, and at bedtime.   atorvastatin (LIPITOR) 40 MG tablet Take 40 mg by mouth daily.   brivaracetam (BRIVIACT) 10 MG/ML solution Take 5 mLs (50 mg total) by mouth 2 (two) times daily.   diazePAM, 15 MG Dose, (VALTOCO 15 MG DOSE) 2 x 7.5 MG/0.1ML LQPK Place 7.5 mg into each nostril (total of 15 mg) for grand mal seizure lasting over 2 minutes. Can repeat once in 4 hours. Do Not use more than  twice in 24 hours   dofetilide (TIKOSYN) 125 MCG capsule Take 3 capsules (375 mcg total) by mouth 2 (two) times daily.   finasteride (PROSCAR) 5 MG tablet Take 1 tablet (5 mg total) by mouth daily.   lacosamide (VIMPAT) 10 MG/ML oral solution Take 50 mg by mouth 2 (two) times daily.   magnesium gluconate (MAGONATE) 500 MG tablet Take 1 tablet (500 mg total) by mouth daily.   melatonin 5 MG TABS Take 5 mg by mouth at bedtime.   methenamine (MANDELAMINE) 1 g tablet Take 1,000 mg by mouth 2 (two) times daily.   methylphenidate (RITALIN) 10 MG tablet Take 1 tablet (10 mg total) by mouth 2 (two) times daily with  breakfast and lunch. Take at 7 am and 12 pm   Multiple Vitamin (MULTIVITAMIN WITH MINERALS) TABS tablet Take 1 tablet by mouth daily.   omeprazole (PRILOSEC) 20 MG capsule Take 20 mg by mouth daily.   OXYGEN Inhale into the lungs as directed. Oxygen at 2L [place] - titrate for sat > or = to 90%; May leave on for SOB Every Shift - PRN; PRN 1, PRN 2, PRN 3   Polyethyl Glycol-Propyl Glycol (SYSTANE) 0.4-0.3 % SOLN Place 2 drops into both eyes daily.   polyethylene glycol (MIRALAX / GLYCOLAX) 17 g packet Take 17 g by mouth daily.   potassium chloride (KLOR-CON M) 10 MEQ tablet Take 20 mEq by mouth 2 (two) times daily. in the am and 20 meq at noon   saline (AYR) GEL Place 1 Application into both nostrils in the morning and at bedtime.   torsemide (DEMADEX) 20 MG tablet Take 20 mg by mouth 2 (two) times daily. 20 mg in the am and 20 mg at noon   UNABLE TO FIND BiPAP  Apply BiPAP @ Q[facility], Make sure no water or moisture in hose (use spare hose if necessary) Reapply if taken off. Special Instructions: Check hose for moisture/water, if present use spare hose [DX: Obstructive sleep apnea (adult) (pediatric)] At Bedtime; 09:00 PM - 10:00 PM   No facility-administered encounter medications on file as of 11/03/2022.    Review of Systems:  Review of Systems  Unable to perform ROS: Mental status  change    Health Maintenance  Topic Date Due   Medicare Annual Wellness (AWV)  07/10/2020   COVID-19 Vaccine (5 - 2023-24 season) 03/07/2022   INFLUENZA VACCINE  10/06/2022   FOOT EXAM  10/23/2022   OPHTHALMOLOGY EXAM  02/05/2023 (Originally 10/04/1950)   Diabetic kidney evaluation - Urine ACR  07/01/2027 (Originally 10/04/1958)   HEMOGLOBIN A1C  01/04/2023   Diabetic kidney evaluation - eGFR measurement  11/01/2023   DTaP/Tdap/Td (4 - Td or Tdap) 03/22/2028   Pneumonia Vaccine 64+ Years old  Completed   Zoster Vaccines- Shingrix  Completed   HPV VACCINES  Aged Out   Colonoscopy  Discontinued    Physical Exam: Vitals:   11/05/22 0732  BP: 110/62  Pulse: 64  Resp: (!) 24  Temp: 98 F (36.7 C)  SpO2: 94%  Weight: 135 lb 6.4 oz (61.4 kg)   Body mass index is 21.21 kg/m. Physical Exam Vitals and nursing note reviewed.  Constitutional:      General: He is not in acute distress.    Appearance: He is not diaphoretic.  HENT:     Head: Normocephalic and atraumatic.     Nose: Nose normal.     Mouth/Throat:     Mouth: Mucous membranes are moist.     Pharynx: Oropharynx is clear.  Eyes:     Conjunctiva/sclera: Conjunctivae normal.     Pupils: Pupils are equal, round, and reactive to light.  Neck:     Thyroid: No thyromegaly.     Vascular: No JVD.     Trachea: No tracheal deviation.  Cardiovascular:     Rate and Rhythm: Normal rate. Rhythm irregular.     Heart sounds: No murmur heard. Pulmonary:     Effort: Pulmonary effort is normal. No respiratory distress.     Breath sounds: Normal breath sounds. No wheezing.  Abdominal:     General: Bowel sounds are normal. There is no distension.     Palpations: Abdomen is soft.  Tenderness: There is no abdominal tenderness.  Musculoskeletal:     Cervical back: Normal range of motion and neck supple.     Left lower leg: No edema.     Comments: Trace pitting edema right ankle  Lymphadenopathy:     Cervical: No cervical  adenopathy.  Skin:    General: Skin is warm and dry.  Neurological:     Mental Status: He is alert.     Comments: Left sided weakness. Not able to f/c.  Responds with facial movement to physical and verbal stimuli.      Labs reviewed: Basic Metabolic Panel: Recent Labs    12/13/21 1157 12/14/21 0000 02/02/22 1202 03/07/22 0000 07/05/22 0000 08/02/22 0000 08/10/22 1102 09/01/22 1123 11/01/22 0000  NA  --    < > 145   < > 148*   < > 147* 144 145  K  --    < > 3.6   < > 4.1   < > 3.8 3.4* 4.0  CL  --    < > 107   < > 106   < > 107 108 108  CO2  --    < > 28   < > 24*   < > 25 26 24*  GLUCOSE  --    < > 126*  --   --   --  136* 158*  --   BUN  --    < > 32*   < > 24*   < > 31* 29* 36*  CREATININE  --    < > 1.14   < > 1.0   < > 1.10 1.06 1.1  CALCIUM  --    < > 9.2   < > 9.4   < > 9.4 9.0 8.6*  MG 2.4  --   --   --   --   --   --  2.5*  --   TSH  --   --   --   --  1.74  --   --   --   --    < > = values in this interval not displayed.   Liver Function Tests: Recent Labs    01/03/22 1322 02/02/22 1202 06/07/22 0000 08/10/22 1102  AST 25 22 33 34  ALT 20 23 42* 54*  ALKPHOS 100  --  107  --   BILITOT 1.1 0.9  --  1.0  PROT 7.4 7.1  --  7.3  ALBUMIN 3.6  --  4.2  --    No results for input(s): "LIPASE", "AMYLASE" in the last 8760 hours. No results for input(s): "AMMONIA" in the last 8760 hours. CBC: Recent Labs    02/02/22 1202 03/14/22 0000 08/10/22 1102 09/01/22 1123 10/10/22 0000  WBC 9.4   < > 9.0 9.3 8.9  NEUTROABS 6,110  --  6,759  --   --   HGB 9.0*   < > 12.2* 11.7* 11.5*  HCT 28.3*   < > 37.1* 36.7* 35*  MCV 86.0  --  93.0 96.1  --   PLT 261   < > 220 171 193   < > = values in this interval not displayed.   Lipid Panel: Recent Labs    06/14/22 0000  CHOL 98  HDL 32*  LDLCALC 54  TRIG 57   Lab Results  Component Value Date   HGBA1C 6.3 07/05/2022    Procedures since last visit: No results found.  Assessment/Plan  1. Essential  hypertension Will hold diuretic today due low bp  Resume tomorrow.   2. Lethargy This has been a slow progression over time  Increased ritalin dosing has not helped.  This is likely due to progression associated with his TBI and multiple CVAs. We do not have more to offer here. Discussed with Ms. Apperson focusing on his comfort care than treating numbers. Hospice has been offered in the past. They are holding off.   3. Oropharyngeal dysphagia Continue puree diet ST following.

## 2022-11-05 NOTE — Telephone Encounter (Signed)
[facility] calls to report weight gain of 7 lbs. H/o CHF.  Torsemide was held 08/30 due to low blood pressure. Nurse reports clear lung sounds and no pitting edema. Na+ 145 11/01/2022. Advised to re-weight patient to confirm weight and recheck blood pressure. If SBP> 110, ok to resume torsemide daily orders.

## 2022-11-07 DIAGNOSIS — G4089 Other seizures: Secondary | ICD-10-CM | POA: Diagnosis not present

## 2022-11-07 DIAGNOSIS — I69812 Visuospatial deficit and spatial neglect following other cerebrovascular disease: Secondary | ICD-10-CM | POA: Diagnosis not present

## 2022-11-07 DIAGNOSIS — R41840 Attention and concentration deficit: Secondary | ICD-10-CM | POA: Diagnosis not present

## 2022-11-07 DIAGNOSIS — R293 Abnormal posture: Secondary | ICD-10-CM | POA: Diagnosis not present

## 2022-11-07 DIAGNOSIS — I5022 Chronic systolic (congestive) heart failure: Secondary | ICD-10-CM | POA: Diagnosis not present

## 2022-11-07 DIAGNOSIS — R1312 Dysphagia, oropharyngeal phase: Secondary | ICD-10-CM | POA: Diagnosis not present

## 2022-11-07 DIAGNOSIS — R278 Other lack of coordination: Secondary | ICD-10-CM | POA: Diagnosis not present

## 2022-11-07 DIAGNOSIS — M6389 Disorders of muscle in diseases classified elsewhere, multiple sites: Secondary | ICD-10-CM | POA: Diagnosis not present

## 2022-11-07 DIAGNOSIS — S065X3S Traumatic subdural hemorrhage with loss of consciousness of 1 hour to 5 hours 59 minutes, sequela: Secondary | ICD-10-CM | POA: Diagnosis not present

## 2022-11-07 DIAGNOSIS — I69354 Hemiplegia and hemiparesis following cerebral infarction affecting left non-dominant side: Secondary | ICD-10-CM | POA: Diagnosis not present

## 2022-11-07 DIAGNOSIS — H538 Other visual disturbances: Secondary | ICD-10-CM | POA: Diagnosis not present

## 2022-11-07 DIAGNOSIS — R41841 Cognitive communication deficit: Secondary | ICD-10-CM | POA: Diagnosis not present

## 2022-11-08 DIAGNOSIS — I10 Essential (primary) hypertension: Secondary | ICD-10-CM | POA: Diagnosis not present

## 2022-11-08 DIAGNOSIS — I38 Endocarditis, valve unspecified: Secondary | ICD-10-CM | POA: Diagnosis not present

## 2022-11-08 LAB — BASIC METABOLIC PANEL
BUN: 39 — AB (ref 4–21)
CO2: 23 — AB (ref 13–22)
Chloride: 103 (ref 99–108)
Creatinine: 1.4 — AB (ref 0.6–1.3)
Glucose: 97
Potassium: 4.7 meq/L (ref 3.5–5.1)
Sodium: 143 (ref 137–147)

## 2022-11-08 LAB — COMPREHENSIVE METABOLIC PANEL: Calcium: 9 (ref 8.7–10.7)

## 2022-11-09 DIAGNOSIS — I5022 Chronic systolic (congestive) heart failure: Secondary | ICD-10-CM | POA: Diagnosis not present

## 2022-11-09 DIAGNOSIS — I69354 Hemiplegia and hemiparesis following cerebral infarction affecting left non-dominant side: Secondary | ICD-10-CM | POA: Diagnosis not present

## 2022-11-09 DIAGNOSIS — G4089 Other seizures: Secondary | ICD-10-CM | POA: Diagnosis not present

## 2022-11-09 DIAGNOSIS — S065X3S Traumatic subdural hemorrhage with loss of consciousness of 1 hour to 5 hours 59 minutes, sequela: Secondary | ICD-10-CM | POA: Diagnosis not present

## 2022-11-09 DIAGNOSIS — R1312 Dysphagia, oropharyngeal phase: Secondary | ICD-10-CM | POA: Diagnosis not present

## 2022-11-09 DIAGNOSIS — R41841 Cognitive communication deficit: Secondary | ICD-10-CM | POA: Diagnosis not present

## 2022-11-10 ENCOUNTER — Encounter: Payer: Self-pay | Admitting: Adult Health

## 2022-11-10 ENCOUNTER — Non-Acute Institutional Stay (SKILLED_NURSING_FACILITY): Payer: Medicare Other | Admitting: Adult Health

## 2022-11-10 DIAGNOSIS — R1312 Dysphagia, oropharyngeal phase: Secondary | ICD-10-CM

## 2022-11-10 DIAGNOSIS — I5021 Acute systolic (congestive) heart failure: Secondary | ICD-10-CM

## 2022-11-10 NOTE — Addendum Note (Signed)
Addended by: Esmond Camper on: 11/10/2022 03:29 PM   Modules accepted: Orders

## 2022-11-10 NOTE — Progress Notes (Signed)
Location:  [facility] Room Number: 127 A Place of Service:  SNF [phone]) Provider:  Fletcher Anon, NP   Patient Care Team: Mahlon Gammon, MD as PCP - General (Internal Medicine) Laurey Morale, MD as PCP - Cardiology (Cardiology) Mia Creek, MD as Consulting Physician (Ophthalmology) Etta Grandchild, MD (Internal Medicine)  Extended Emergency Contact Information Primary Emergency Contact: Kae Heller Address: [address]          [place], [place] [address] [place] Home Phone: [phone] Mobile Phone: [phone] Relation: Spouse Secondary Emergency Contact: Phineas Inches, [place] [address] [place] Home Phone: [phone] Relation: Son  Code Status:  DNR Goals of care: Advanced Directive information    11/10/2022    9:45 AM  Advanced Directives  Does Patient Have a Medical Advance Directive? Yes  Type of Estate agent of [place];Living will;Out of facility DNR (pink MOST or yellow form)  Does patient want to make changes to medical advance directive? No - Patient declined  Copy of Healthcare Power of Attorney in Chart? Yes - validated most recent copy scanned in chart (See row information)     Chief Complaint  Patient presents with   Acute Visit    Bumps on tongue     HPI:  Pt is a 82 y.o. male seen today for an acute visit for bumps on the tongue.   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   Nurse reports white bumps on the back of his tongue. No pain or fever.   He has been declining, sleeping more, less interactive, increased RR, and worsening dysphagia. He is on a puree diet and working with ST. Coughs with certain food items such as boost.  Also he has been gaining weight. 8-10  lbs in the past month. Received one extra dose of torsemide. Meanwhile his bp has been soft at times, difficult to auscultate. He has edema in his right leg and abd distention. Over the weekend he did appear sob. Nursing concerns for end of life.  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   On life long amoxicillin for hx of endocarditis.  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    Other and unspecified hyperlipidemia  Pre-diabetes    Previous back surgery 1978, jan 2007   Psychosexual dysfunction with inhibited sexual excitement    SOB (shortness of breath)    "with heavy exercise"   Stroke (HCC) 08/2013   "I WAS IN AFIB AND THREW A CLOT, THE EFFECTS WERE TRANSITORY AND DIDNT LAST BUT FOR 30 MINUTES"    Thoracic aortic aneurysm [facility])    Past Surgical History:  Procedure Laterality Date   AORTIC AND MITRAL VALVE REPLACEMENT     09/2004   CARDIOVERSION     3 times from 2004-2006   CARDIOVERSION N/A 09/26/2013   Procedure: CARDIOVERSION;  Surgeon: Laurey Morale,  MD;  Location: [facility] ENDOSCOPY;  Service: Cardiovascular;  Laterality: N/A;   CARDIOVERSION N/A 06/19/2014   Procedure: CARDIOVERSION;  Surgeon: Jake Bathe, MD;  Location: [facility] ENDOSCOPY;  Service: Cardiovascular;  Laterality: N/A;   CARDIOVERSION N/A 04/24/2017   Procedure: CARDIOVERSION;  Surgeon: Laurey Morale, MD;  Location: [facility] ENDOSCOPY;  Service: Cardiovascular;  Laterality: N/A;   CARDIOVERSION N/A 06/08/2017   Procedure: CARDIOVERSION;  Surgeon: Chrystie Nose, MD;  Location: [facility] ENDOSCOPY;  Service: Cardiovascular;  Laterality: N/A;   CARDIOVERSION N/A 08/24/2017   Procedure: CARDIOVERSION;  Surgeon: Chilton Si, MD;  Location: [facility] ENDOSCOPY;  Service: Cardiovascular;  Laterality: N/A;   CARDIOVERSION N/A 02/14/2018   Procedure: CARDIOVERSION;  Surgeon: Laurey Morale, MD;  Location: [facility] ENDOSCOPY;  Service: Cardiovascular;  Laterality: N/A;   COLONOSCOPY     CRANIOTOMY N/A 07/28/2020   Procedure: CRANIOTOMY FOR EVACUATION OF SUBDURAL HEMATOMA;  Surgeon: Tia Alert, MD;  Location: [facility] OR;  Service: Neurosurgery;  Laterality: N/A;   IR ANGIO VERTEBRAL SEL SUBCLAVIAN INNOMINATE UNI R MOD SED  08/07/2020   IR CT HEAD LTD  08/07/2020   IR PERCUTANEOUS ART THROMBECTOMY/INFUSION INTRACRANIAL INC DIAG ANGIO  08/07/2020   laminectomies     10/1975 and in 03/2005   RADIOLOGY WITH ANESTHESIA N/A 08/07/2020   Procedure: IR WITH ANESTHESIA;  Surgeon: Julieanne Cotton, MD;  Location: [facility] OR;  Service: Radiology;  Laterality: N/A;   TEE WITHOUT CARDIOVERSION N/A 12/18/2020   Procedure: TRANSESOPHAGEAL ECHOCARDIOGRAM (TEE);  Surgeon: Laurey Morale, MD;  Location: [facility] ENDOSCOPY;  Service: Cardiovascular;  Laterality: N/A;   TONSILLECTOMY     1950   TOTAL HIP ARTHROPLASTY Right 04/03/2018   Procedure: TOTAL HIP ARTHROPLASTY ANTERIOR APPROACH;  Surgeon: Durene Romans, MD;  Location: [facility] ORS;  Service: Orthopedics;  Laterality: Right;  70 mins    Allergies  Allergen Reactions   Naproxen Hives    No [facility] Food Allergies Other (See Comments)    Scallops - distress, nausea and vomitting    Outpatient Encounter Medications as of 11/10/2022  Medication Sig   acetaminophen (TYLENOL) 325 MG tablet Take 650 mg by mouth 3 (three) times daily as needed for mild pain.   Amantadine HCl 100 MG tablet Take 100 mg by mouth 2 (two) times daily.   amoxicillin (AMOXIL) 500 MG capsule Take 500 mg by mouth 2 (two) times daily.   apixaban (ELIQUIS) 5 MG TABS tablet Take 1 tablet (5 mg total) by mouth 2 (two) times daily.   Artificial Saliva (BIOTENE MOISTURIZING MOUTH MT) Use as directed 2 sprays in the mouth or throat in the morning, at noon, and at bedtime.   atorvastatin (LIPITOR) 40 MG tablet Take 40 mg by mouth daily.   brivaracetam (BRIVIACT) 10 MG/ML solution Take 5 mLs (50 mg total) by mouth 2 (two) times daily.  diazePAM, 15 MG Dose, (VALTOCO 15 MG DOSE) 2 x 7.5 MG/0.1ML LQPK Place 7.5 mg into each nostril (total of 15 mg) for grand mal seizure lasting over 2 minutes. Can repeat once in 4 hours. Do Not use more than twice in 24 hours   dofetilide (TIKOSYN) 125 MCG capsule Take 3 capsules (375 mcg total) by mouth 2 (two) times daily.   finasteride (PROSCAR) 5 MG tablet Take 1 tablet (5 mg total) by mouth daily.   Glucerna (GLUCERNA) LIQD Take 237 mLs by mouth daily.   lacosamide (VIMPAT) 10 MG/ML oral solution Take 50 mg by mouth 2 (two) times daily.   magnesium gluconate (MAGONATE) 500 MG tablet Take 1 tablet (500 mg total) by mouth daily.   melatonin 5 MG TABS Take 5 mg by mouth at bedtime.   methenamine (MANDELAMINE) 1 g tablet Take 1,000 mg by mouth 2 (two) times daily.   methylphenidate (RITALIN) 10 MG tablet Take 1 tablet (10 mg total) by mouth 2 (two) times daily with breakfast and lunch. Take at 7 am and 12 pm   Multiple Vitamin (MULTIVITAMIN WITH MINERALS) TABS tablet Take 1 tablet by mouth daily.   omeprazole (PRILOSEC) 20 MG capsule Take 20 mg by mouth daily.   OXYGEN Inhale into  the lungs as directed. Oxygen at 2L [place] - titrate for sat > or = to 90%; May leave on for SOB Every Shift - PRN; PRN 1, PRN 2, PRN 3   Polyethyl Glycol-Propyl Glycol (SYSTANE) 0.4-0.3 % SOLN Place 2 drops into both eyes daily.   polyethylene glycol (MIRALAX / GLYCOLAX) 17 g packet Take 17 g by mouth daily.   potassium chloride (KLOR-CON M) 10 MEQ tablet Take 20 mEq by mouth daily. At 8 am, see other listing for pm dosing   potassium chloride SA (KLOR-CON M) 20 MEQ tablet Take 20 mEq by mouth daily. 12 pm, see other listing for am dosing   saline (AYR) GEL Place 1 Application into both nostrils in the morning and at bedtime.   torsemide (DEMADEX) 20 MG tablet Take 20 mg by mouth 2 (two) times daily. 8 am and 12 pm   UNABLE TO FIND BiPAP  Apply BiPAP @ [facility], Make sure no water or moisture in hose (use spare hose if necessary) Reapply if taken off. Special Instructions: Check hose for moisture/water, if present use spare hose [DX: Obstructive sleep apnea (adult) (pediatric)] At Bedtime; 09:00 PM - 10:00 PM   No facility-administered encounter medications on file as of 11/10/2022.    Review of Systems  Unable to perform ROS: Mental status change    Immunization History  Administered Date(s) Administered   Fluad Quad(high Dose 65+) 12/06/2018, 12/09/2020, 12/10/2021   Influenza Whole 12/25/2008, 01/05/2010, 12/30/2011   Influenza, High Dose Seasonal PF 12/14/2016   Influenza,inj,Quad PF,6+ Mos 12/19/2013   Influenza,inj,quad, With Preservative 11/22/2017   Influenza-Unspecified 12/14/2015, 12/24/2019, 12/10/2021   Moderna Covid-19 Vaccine Bivalent Booster 64yrs & up 01/12/2021   Moderna SARS-COV2 Booster Vaccination 01/02/2020, 06/26/2020   Moderna Sars-Covid-2 Vaccination 04/08/2019, 05/05/2019, 01/10/2022   Pneumococcal Conjugate-13 12/19/2013   Pneumococcal Polysaccharide-23 09/24/2007, 05/22/2017   Rsv, Bivalent, Protein Subunit Rsvpref,pf Verdis Frederickson) 02/14/2022   Td 12/25/2008   Td  (Adult), 2 Lf Tetanus Toxid, Preservative Free 12/25/2008   Tdap 03/22/2018   Zoster Recombinant(Shingrix) 07/16/2019, 10/28/2019   Zoster, Live 09/24/2007   Pertinent  Health Maintenance Due  Topic Date Due   INFLUENZA VACCINE  10/06/2022   FOOT EXAM  10/23/2022  OPHTHALMOLOGY EXAM  02/05/2023 (Originally 10/04/1950)   HEMOGLOBIN A1C  01/04/2023   Colonoscopy  Discontinued      08/12/2021    8:00 AM 10/13/2021    2:32 PM 02/02/2022   11:24 AM 04/20/2022    3:01 PM 10/26/2022   10:45 AM  Fall Risk  Falls in the past year?  0 0 0 Exclusion - non ambulatory  Was there an injury with Fall?   0 0   Fall Risk Category Calculator   0 0   Fall Risk Category (Retired)   Low    (RETIRED) Patient Fall Risk Level High fall risk  High fall risk    Patient at Risk for Falls Due to   Impaired mobility    Fall risk Follow up   Falls evaluation completed     Functional Status Survey:    Vitals:   11/10/22 0944  BP: 110/68  Pulse: 76  Temp: 98 F (36.7 C)  SpO2: 98%  Weight: 142 lb 6.4 oz (64.6 kg)  Height: 5\' 7"  (1.702 m)   Body mass index is 22.3 kg/m. Physical Exam Vitals and nursing note reviewed.  Constitutional:      General: He is not in acute distress.    Appearance: He is not diaphoretic.     Comments: Eyes closed able to follow commands to take a deep breath, otherwise sleeps, no words.  Brow furrowed  HENT:     Head: Normocephalic and atraumatic.     Nose: Nose normal.     Mouth/Throat:     Mouth: Mucous membranes are moist.     Pharynx: Oropharynx is clear.  Neck:     Thyroid: No thyromegaly.     Vascular: No JVD.     Trachea: No tracheal deviation.  Cardiovascular:     Rate and Rhythm: Normal rate and regular rhythm.     Heart sounds: No murmur heard. Pulmonary:     Effort: Pulmonary effort is normal. No respiratory distress.     Breath sounds: Rales (left base) present. No wheezing.  Abdominal:     General: There is distension.     Palpations: Abdomen is  soft.     Tenderness: There is no abdominal tenderness.     Comments: Hypoactive x 4  Musculoskeletal:     Right lower leg: Edema (+1) present.     Left lower leg: Edema (trace) present.  Lymphadenopathy:     Cervical: No cervical adenopathy.  Skin:    General: Skin is warm and dry.  Neurological:     Comments: Left sided weakness     Labs reviewed: Recent Labs    12/13/21 1157 12/14/21 0000 02/02/22 1202 03/07/22 0000 08/10/22 1102 09/01/22 1123 11/01/22 0000 11/08/22 0000  NA  --    < > 145   < > 147* 144 145 143  K  --    < > 3.6   < > 3.8 3.4* 4.0 4.7  CL  --    < > 107   < > 107 108 108 103  CO2  --    < > 28   < > 25 26 24* 23*  GLUCOSE  --    < > 126*  --  136* 158*  --   --   BUN  --    < > 32*   < > 31* 29* 36* 39*  CREATININE  --    < > 1.14   < > 1.10 1.06 1.1 1.4*  CALCIUM  --    < > 9.2   < > 9.4 9.0 8.6* 9.0  MG 2.4  --   --   --   --  2.5*  --   --    < > = values in this interval not displayed.   Recent Labs    01/03/22 1322 02/02/22 1202 06/07/22 0000 08/10/22 1102  AST 25 22 33 34  ALT 20 23 42* 54*  ALKPHOS 100  --  107  --   BILITOT 1.1 0.9  --  1.0  PROT 7.4 7.1  --  7.3  ALBUMIN 3.6  --  4.2  --    Recent Labs    02/02/22 1202 03/14/22 0000 08/10/22 1102 09/01/22 1123 10/10/22 0000  WBC 9.4   < > 9.0 9.3 8.9  NEUTROABS 6,110  --  6,759  --   --   HGB 9.0*   < > 12.2* 11.7* 11.5*  HCT 28.3*   < > 37.1* 36.7* 35*  MCV 86.0  --  93.0 96.1  --   PLT 261   < > 220 171 193   < > = values in this interval not displayed.   Lab Results  Component Value Date   TSH 1.74 07/05/2022   Lab Results  Component Value Date   HGBA1C 6.3 07/05/2022   Lab Results  Component Value Date   CHOL 98 06/14/2022   HDL 32 (A) 06/14/2022   LDLCALC 54 06/14/2022   TRIG 57 06/14/2022   CHOLHDL 2.8 08/08/2020    Significant Diagnostic Results in last 30 days:  No results found.  Assessment/Plan  1. Acute systolic congestive heart failure  (HCC) Increase Torsemide to 40 mg in am and 20 mg at lunch for three days.   2. Oropharyngeal dysphagia Worsening now on puree diet and thickened liquids No gag reflex on exam Working with ST Risk for recurrent aspiration   I had a long discussion with his family. Mr. Donnel is progressing with worsening cognition and dysphagia. Also he is experiencing worsening CHF but has trouble with high sodium at times. BUN/Cr are rising as well. I recommended adding morphine prn for sob but they will need to think about this. Also we discussed whether he was continuing to benefit from medications. He has been offered hospice but they don't want to change his complex medication regimen which may happen with their formulary. He appears to be nearing the end of life. We will diurese him for the next three days while his family considers his goals of care.    Family/ staff Communication: discussed with wife Nicole Kindred and son Rosemarie Ax ordered:  BMP weekly

## 2022-11-11 DIAGNOSIS — S065X3S Traumatic subdural hemorrhage with loss of consciousness of 1 hour to 5 hours 59 minutes, sequela: Secondary | ICD-10-CM | POA: Diagnosis not present

## 2022-11-11 DIAGNOSIS — G4089 Other seizures: Secondary | ICD-10-CM | POA: Diagnosis not present

## 2022-11-11 DIAGNOSIS — R41841 Cognitive communication deficit: Secondary | ICD-10-CM | POA: Diagnosis not present

## 2022-11-11 DIAGNOSIS — I69354 Hemiplegia and hemiparesis following cerebral infarction affecting left non-dominant side: Secondary | ICD-10-CM | POA: Diagnosis not present

## 2022-11-11 DIAGNOSIS — I5022 Chronic systolic (congestive) heart failure: Secondary | ICD-10-CM | POA: Diagnosis not present

## 2022-11-11 DIAGNOSIS — R1312 Dysphagia, oropharyngeal phase: Secondary | ICD-10-CM | POA: Diagnosis not present

## 2022-11-14 ENCOUNTER — Other Ambulatory Visit: Payer: Self-pay | Admitting: Adult Health

## 2022-11-14 DIAGNOSIS — G4089 Other seizures: Secondary | ICD-10-CM | POA: Diagnosis not present

## 2022-11-14 DIAGNOSIS — R1312 Dysphagia, oropharyngeal phase: Secondary | ICD-10-CM | POA: Diagnosis not present

## 2022-11-14 DIAGNOSIS — I5022 Chronic systolic (congestive) heart failure: Secondary | ICD-10-CM | POA: Diagnosis not present

## 2022-11-14 DIAGNOSIS — S065X3S Traumatic subdural hemorrhage with loss of consciousness of 1 hour to 5 hours 59 minutes, sequela: Secondary | ICD-10-CM | POA: Diagnosis not present

## 2022-11-14 DIAGNOSIS — R41841 Cognitive communication deficit: Secondary | ICD-10-CM | POA: Diagnosis not present

## 2022-11-14 DIAGNOSIS — I69354 Hemiplegia and hemiparesis following cerebral infarction affecting left non-dominant side: Secondary | ICD-10-CM | POA: Diagnosis not present

## 2022-11-14 MED ORDER — LACOSAMIDE 10 MG/ML PO SOLN: 50.0000 mg | Freq: Two times a day (BID) | ORAL | 3 refills | Status: DC

## 2022-11-15 DIAGNOSIS — I38 Endocarditis, valve unspecified: Secondary | ICD-10-CM | POA: Diagnosis not present

## 2022-11-16 ENCOUNTER — Encounter: Payer: Self-pay | Admitting: Physical Medicine & Rehabilitation

## 2022-11-16 ENCOUNTER — Encounter: Payer: Medicare Other | Attending: Physical Medicine & Rehabilitation | Admitting: Physical Medicine & Rehabilitation

## 2022-11-16 VITALS — BP 108/72 | HR 67 | Ht 67.0 in | Wt 142.4 lb

## 2022-11-16 DIAGNOSIS — I69354 Hemiplegia and hemiparesis following cerebral infarction affecting left non-dominant side: Secondary | ICD-10-CM | POA: Diagnosis not present

## 2022-11-16 DIAGNOSIS — R41841 Cognitive communication deficit: Secondary | ICD-10-CM | POA: Diagnosis not present

## 2022-11-16 DIAGNOSIS — S069X3D Unspecified intracranial injury with loss of consciousness of 1 hour to 5 hours 59 minutes, subsequent encounter: Secondary | ICD-10-CM | POA: Diagnosis not present

## 2022-11-16 DIAGNOSIS — R1312 Dysphagia, oropharyngeal phase: Secondary | ICD-10-CM | POA: Diagnosis not present

## 2022-11-16 DIAGNOSIS — G8114 Spastic hemiplegia affecting left nondominant side: Secondary | ICD-10-CM | POA: Insufficient documentation

## 2022-11-16 DIAGNOSIS — I5022 Chronic systolic (congestive) heart failure: Secondary | ICD-10-CM | POA: Diagnosis not present

## 2022-11-16 DIAGNOSIS — S065X3S Traumatic subdural hemorrhage with loss of consciousness of 1 hour to 5 hours 59 minutes, sequela: Secondary | ICD-10-CM | POA: Diagnosis not present

## 2022-11-16 DIAGNOSIS — G4089 Other seizures: Secondary | ICD-10-CM | POA: Diagnosis not present

## 2022-11-16 MED ORDER — SODIUM CHLORIDE (PF) 0.9 % IJ SOLN
4.0000 mL | Freq: Once | INTRAMUSCULAR | Status: AC
Start: 2022-11-16 — End: 2022-11-16
  Administered 2022-11-16: 4 mL

## 2022-11-16 MED ORDER — ONABOTULINUMTOXINA 100 UNITS IJ SOLR
400.0000 [IU] | Freq: Once | INTRAMUSCULAR | Status: AC
Start: 2022-11-16 — End: 2022-11-16
  Administered 2022-11-16: 400 [IU] via INTRAMUSCULAR

## 2022-11-16 NOTE — Patient Instructions (Signed)
ALWAYS FEEL FREE TO CALL OUR OFFICE WITH ANY PROBLEMS OR QUESTIONS ([phone])  **PLEASE NOTE** ALL MEDICATION REFILL REQUESTS (INCLUDING CONTROLLED SUBSTANCES) NEED TO BE MADE AT LEAST 7 DAYS PRIOR TO REFILL BEING DUE. ANY REFILL REQUESTS INSIDE THAT TIME FRAME MAY RESULT IN DELAYS IN RECEIVING YOUR PRESCRIPTION.                    

## 2022-11-16 NOTE — Progress Notes (Signed)
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 This includes bleeding, bruising, infection, excessive weakness, or medication side effects. A REMS form is on file and signed.  Needle: 50mm injectable monopolar needle electrode    Number of units per muscle Pectoralis Major 50 units Pectoralis Minor 50 units Biceps 50 units Brachioradialis 10 units FCR 10 units FCU 10 units FDS 50 units FDP 50 units FPL 0 units Palmaris Longus 0 units Pronator Teres 0 units Pronator Quadratus 0 units Lumbricals 120 units All injections were done after obtaining appropriate EMG activity and after negative drawback for blood. The patient tolerated the procedure well. Post procedure instructions were given. Return in about 3 months (around 02/15/2023) for botox 400 u LUE.

## 2022-11-18 DIAGNOSIS — I69354 Hemiplegia and hemiparesis following cerebral infarction affecting left non-dominant side: Secondary | ICD-10-CM | POA: Diagnosis not present

## 2022-11-18 DIAGNOSIS — R41841 Cognitive communication deficit: Secondary | ICD-10-CM | POA: Diagnosis not present

## 2022-11-18 DIAGNOSIS — R1312 Dysphagia, oropharyngeal phase: Secondary | ICD-10-CM | POA: Diagnosis not present

## 2022-11-18 DIAGNOSIS — S065X3S Traumatic subdural hemorrhage with loss of consciousness of 1 hour to 5 hours 59 minutes, sequela: Secondary | ICD-10-CM | POA: Diagnosis not present

## 2022-11-18 DIAGNOSIS — G4089 Other seizures: Secondary | ICD-10-CM | POA: Diagnosis not present

## 2022-11-18 DIAGNOSIS — I5022 Chronic systolic (congestive) heart failure: Secondary | ICD-10-CM | POA: Diagnosis not present

## 2022-11-21 ENCOUNTER — Non-Acute Institutional Stay (SKILLED_NURSING_FACILITY): Payer: Self-pay | Admitting: Adult Health

## 2022-11-21 ENCOUNTER — Encounter: Payer: Self-pay | Admitting: Adult Health

## 2022-11-21 DIAGNOSIS — E860 Dehydration: Secondary | ICD-10-CM | POA: Diagnosis not present

## 2022-11-21 DIAGNOSIS — R1312 Dysphagia, oropharyngeal phase: Secondary | ICD-10-CM

## 2022-11-21 DIAGNOSIS — I69354 Hemiplegia and hemiparesis following cerebral infarction affecting left non-dominant side: Secondary | ICD-10-CM | POA: Diagnosis not present

## 2022-11-21 DIAGNOSIS — S065X3S Traumatic subdural hemorrhage with loss of consciousness of 1 hour to 5 hours 59 minutes, sequela: Secondary | ICD-10-CM | POA: Diagnosis not present

## 2022-11-21 DIAGNOSIS — R419 Unspecified symptoms and signs involving cognitive functions and awareness: Secondary | ICD-10-CM | POA: Diagnosis not present

## 2022-11-21 DIAGNOSIS — G4089 Other seizures: Secondary | ICD-10-CM | POA: Diagnosis not present

## 2022-11-21 DIAGNOSIS — R5383 Other fatigue: Secondary | ICD-10-CM

## 2022-11-21 DIAGNOSIS — R41841 Cognitive communication deficit: Secondary | ICD-10-CM | POA: Diagnosis not present

## 2022-11-21 DIAGNOSIS — I5022 Chronic systolic (congestive) heart failure: Secondary | ICD-10-CM | POA: Diagnosis not present

## 2022-11-21 MED ORDER — METHYLPHENIDATE HCL 5 MG PO TABS
5.0000 mg | ORAL_TABLET | Freq: Two times a day (BID) | ORAL | 0 refills | Status: AC
Start: 2022-11-21 — End: ?

## 2022-11-21 NOTE — Progress Notes (Unsigned)
Location:  [facility] Room Number: 127 Place of Service:  SNF [phone]) Provider:  Tamsen Roers, MD  Patient Care Team: Mahlon Gammon, MD as PCP - General (Internal Medicine) Laurey Morale, MD as PCP - Cardiology (Cardiology) Mia Creek, MD as Consulting Physician (Ophthalmology) Etta Grandchild, MD (Internal Medicine)  Extended Emergency Contact Information Primary Emergency Contact: Kae Heller Address: [address]          [place], [place] [address] [place] Home Phone: [phone] Mobile Phone: [phone] Relation: Spouse Secondary Emergency Contact: Phineas Inches, [place] [address] [place] Home Phone: [phone] Relation: Son  Code Status:  DNR Goals of care: Advanced Directive information    11/21/2022    2:00 PM  Advanced Directives  Does Patient Have a Medical Advance Directive? Yes  Type of Estate agent of [place];Living will;Out of facility DNR (pink MOST or yellow form)  Does patient want to make changes to medical advance directive? No - Patient declined  Copy of Healthcare Power of Attorney in Chart? Yes - validated most recent copy scanned in chart (See row information)     Chief Complaint  Patient presents with   Acute Visit    Patient is being seen for acute Lethargy   Immunizations    Patient is due for flu and covid vaccine     HPI:  Pt is a 82 y.o. male seen today for an acute visit for lethargy.  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   The nurse reports he is having difficulty swallowing, food is coming out the sides of his mouth when fed, and he is eating and drinking less. His pulse is 103. Mouth is very dry. RR is 30-40 at times with  periods of apnea. His wife is aware of his decline and that he is nearing the end of life. He has been declining for months.  He does not appear to be in pain. Does furrow his brow at times. He has had cheyne stokes type breathing for months. See previous notes for multiple end of life discussions.   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    Other and unspecified hyperlipidemia    Pre-diabetes    Previous back surgery 1978, jan 2007   Psychosexual dysfunction with inhibited sexual excitement    SOB (shortness of breath)    "with heavy exercise"   Stroke (HCC) 08/2013   "I WAS IN AFIB AND THREW A CLOT, THE EFFECTS WERE TRANSITORY AND DIDNT LAST BUT FOR 30 MINUTES"    Thoracic aortic aneurysm [facility])    Past Surgical History:  Procedure Laterality Date   AORTIC AND  MITRAL VALVE REPLACEMENT     09/2004   CARDIOVERSION     3 times from 2004-2006   CARDIOVERSION N/A 09/26/2013   Procedure: CARDIOVERSION;  Surgeon: Laurey Morale, MD;  Location: [facility] ENDOSCOPY;  Service: Cardiovascular;  Laterality: N/A;   CARDIOVERSION N/A 06/19/2014   Procedure: CARDIOVERSION;  Surgeon: Jake Bathe, MD;  Location: [facility] ENDOSCOPY;  Service: Cardiovascular;  Laterality: N/A;   CARDIOVERSION N/A 04/24/2017   Procedure: CARDIOVERSION;  Surgeon: Laurey Morale, MD;  Location: [facility] ENDOSCOPY;  Service: Cardiovascular;  Laterality: N/A;   CARDIOVERSION N/A 06/08/2017   Procedure: CARDIOVERSION;  Surgeon: Chrystie Nose, MD;   Location: [facility] ENDOSCOPY;  Service: Cardiovascular;  Laterality: N/A;   CARDIOVERSION N/A 08/24/2017   Procedure: CARDIOVERSION;  Surgeon: Chilton Si, MD;  Location: [facility] ENDOSCOPY;  Service: Cardiovascular;  Laterality: N/A;   CARDIOVERSION N/A 02/14/2018   Procedure: CARDIOVERSION;  Surgeon: Laurey Morale, MD;  Location: [facility] ENDOSCOPY;  Service: Cardiovascular;  Laterality: N/A;   COLONOSCOPY     CRANIOTOMY N/A 07/28/2020   Procedure: CRANIOTOMY FOR EVACUATION OF SUBDURAL HEMATOMA;  Surgeon: Tia Alert, MD;  Location: [facility] OR;  Service: Neurosurgery;  Laterality: N/A;   IR ANGIO VERTEBRAL SEL SUBCLAVIAN INNOMINATE UNI R MOD SED  08/07/2020   IR CT HEAD LTD  08/07/2020   IR PERCUTANEOUS ART THROMBECTOMY/INFUSION INTRACRANIAL INC DIAG ANGIO  08/07/2020   laminectomies     10/1975 and in 03/2005   RADIOLOGY WITH ANESTHESIA N/A 08/07/2020   Procedure: IR WITH ANESTHESIA;  Surgeon: Julieanne Cotton, MD;  Location: [facility] OR;  Service: Radiology;  Laterality: N/A;   TEE WITHOUT CARDIOVERSION N/A 12/18/2020   Procedure: TRANSESOPHAGEAL ECHOCARDIOGRAM (TEE);  Surgeon: Laurey Morale, MD;  Location: [facility] ENDOSCOPY;  Service: Cardiovascular;  Laterality: N/A;   TONSILLECTOMY     1950   TOTAL HIP ARTHROPLASTY Right 04/03/2018   Procedure: TOTAL HIP ARTHROPLASTY ANTERIOR APPROACH;  Surgeon: Durene Romans, MD;  Location: [facility] ORS;  Service: Orthopedics;  Laterality: Right;  70 mins    Allergies  Allergen Reactions   Naproxen Hives   No [facility] Food Allergies Other (See Comments)    Scallops - distress, nausea and vomitting    Outpatient Encounter Medications as of 11/21/2022  Medication Sig   acetaminophen (TYLENOL) 325 MG tablet Take 650 mg by mouth 3 (three) times daily as needed for mild pain.   Amantadine HCl 100 MG tablet Take 100 mg by mouth 2 (two) times daily.   amoxicillin (AMOXIL) 500 MG capsule Take 500 mg by mouth 2 (two) times daily.   apixaban (ELIQUIS) 5 MG TABS tablet Take 1 tablet  (5 mg total) by mouth 2 (two) times daily.   Artificial Saliva (BIOTENE MOISTURIZING MOUTH MT) Use as directed 2 sprays in the mouth or throat in the morning, at noon, and at bedtime.   atorvastatin (LIPITOR) 40 MG tablet Take 40 mg by mouth daily.   brivaracetam (BRIVIACT) 10 MG/ML solution Take 5 mLs (50 mg total) by mouth 2 (two) times daily.   dofetilide (TIKOSYN) 125 MCG capsule Take 3 capsules (375 mcg total) by mouth 2 (two) times daily.   finasteride (PROSCAR) 5 MG tablet Take 1 tablet (5 mg total) by mouth daily.   lacosamide (VIMPAT) 10 MG/ML oral solution Take 5 mLs (50 mg total) by mouth 2 (two) times daily.   magnesium gluconate (MAGONATE) 500 MG tablet Take 1 tablet (500 mg total) by mouth daily.   melatonin 5 MG TABS Take 5  mg by mouth at bedtime.   methenamine (MANDELAMINE) 1 g tablet Take 1,000 mg by mouth 2 (two) times daily.   methylphenidate (RITALIN) 5 MG tablet Take 1 tablet (5 mg total) by mouth 2 (two) times daily with breakfast and lunch. Take at 7 am and 12 pm   Multiple Vitamin (MULTIVITAMIN WITH MINERALS) TABS tablet Take 1 tablet by mouth daily.   omeprazole (PRILOSEC) 20 MG capsule Take 20 mg by mouth daily.   OXYGEN Inhale into the lungs as directed. Oxygen at 2L [place] - titrate for sat > or = to 90%; May leave on for SOB Every Shift - PRN; PRN 1, PRN 2, PRN 3   Polyethyl Glycol-Propyl Glycol (SYSTANE) 0.4-0.3 % SOLN Place 2 drops into both eyes daily.   polyethylene glycol (MIRALAX / GLYCOLAX) 17 g packet Take 17 g by mouth daily.   potassium chloride (KLOR-CON M) 10 MEQ tablet Take 20 mEq by mouth daily. At 8 am, see other listing for pm dosing   potassium chloride SA (KLOR-CON M) 20 MEQ tablet Take 20 mEq by mouth daily. 12 pm, see other listing for am dosing   saline (AYR) GEL Place 1 Application into both nostrils in the morning and at bedtime.   UNABLE TO FIND BiPAP  Apply BiPAP @ [facility], Make sure no water or moisture in hose (use spare hose if necessary)  Reapply if taken off. Special Instructions: Check hose for moisture/water, if present use spare hose [DX: Obstructive sleep apnea (adult) (pediatric)] At Bedtime; 09:00 PM - 10:00 PM   diazePAM, 15 MG Dose, (VALTOCO 15 MG DOSE) 2 x 7.5 MG/0.1ML LQPK Place 7.5 mg into each nostril (total of 15 mg) for grand mal seizure lasting over 2 minutes. Can repeat once in 4 hours. Do Not use more than twice in 24 hours   Glucerna (GLUCERNA) LIQD Take 237 mLs by mouth daily.   [DISCONTINUED] methylphenidate (RITALIN) 10 MG tablet Take 1 tablet (10 mg total) by mouth 2 (two) times daily with breakfast and lunch. Take at 7 am and 12 pm   [DISCONTINUED] torsemide (DEMADEX) 20 MG tablet Take 20 mg by mouth 2 (two) times daily. 40 mg in am and 20 mg at noon   No facility-administered encounter medications on file as of 11/21/2022.    Review of Systems  Unable to perform ROS: Mental status change    Immunization History  Administered Date(s) Administered   Fluad Quad(high Dose 65+) 12/06/2018, 12/09/2020, 12/10/2021   Influenza Whole 12/25/2008, 01/05/2010, 12/30/2011   Influenza, High Dose Seasonal PF 12/14/2016   Influenza,inj,Quad PF,6+ Mos 12/19/2013   Influenza,inj,quad, With Preservative 11/22/2017   Influenza-Unspecified 12/14/2015, 12/24/2019, 12/10/2021   Moderna Covid-19 Vaccine Bivalent Booster 13yrs & up 01/12/2021   Moderna SARS-COV2 Booster Vaccination 01/02/2020, 06/26/2020   Moderna Sars-Covid-2 Vaccination 04/08/2019, 05/05/2019, 01/10/2022   Pneumococcal Conjugate-13 12/19/2013   Pneumococcal Polysaccharide-23 09/24/2007, 05/22/2017   Rsv, Bivalent, Protein Subunit Rsvpref,pf Verdis Frederickson) 02/14/2022   Td 12/25/2008   Td (Adult), 2 Lf Tetanus Toxid, Preservative Free 12/25/2008   Tdap 03/22/2018   Zoster Recombinant(Shingrix) 07/16/2019, 10/28/2019   Zoster, Live 09/24/2007   Pertinent  Health Maintenance Due  Topic Date Due   INFLUENZA VACCINE  10/06/2022   FOOT EXAM  10/23/2022    OPHTHALMOLOGY EXAM  02/05/2023 (Originally 10/04/1950)   HEMOGLOBIN A1C  01/04/2023   Colonoscopy  Discontinued      10/13/2021    2:32 PM 02/02/2022   11:24 AM 04/20/2022    3:01 PM  10/26/2022   10:45 AM 11/16/2022    1:51 PM  Fall Risk  Falls in the past year? 0 0 0 Exclusion - non ambulatory Exclusion - non ambulatory  Was there an injury with Fall?  0 0    Fall Risk Category Calculator  0 0    Fall Risk Category (Retired)  Low     (RETIRED) Patient Fall Risk Level  High fall risk     Patient at Risk for Falls Due to  Impaired mobility     Fall risk Follow up  Falls evaluation completed      Functional Status Survey:    Vitals:   11/21/22 1358  BP: 102/73  Pulse: (!) 101  Resp: (!) 30  Temp: 97.6 F (36.4 C)  TempSrc: Temporal  SpO2: 98%  Weight: 137 lb 6.4 oz (62.3 kg)  Height: 5\' 7"  (1.702 m)   Body mass index is 21.52 kg/m. Physical Exam Vitals and nursing note reviewed.  Constitutional:      Appearance: He is ill-appearing. He is not diaphoretic.  HENT:     Head: Normocephalic and atraumatic.     Mouth/Throat:     Mouth: Mucous membranes are dry.     Pharynx: Oropharynx is clear.  Neck:     Thyroid: No thyromegaly.     Vascular: No JVD.     Trachea: No tracheal deviation.  Cardiovascular:     Rate and Rhythm: Normal rate and regular rhythm.     Heart sounds: No murmur heard. Pulmonary:     Breath sounds: Normal breath sounds. No wheezing.     Comments: Increased work of breathing.  Abdominal:     General: Bowel sounds are normal. There is distension (mild).     Palpations: Abdomen is soft.     Tenderness: There is no abdominal tenderness.  Musculoskeletal:     Cervical back: Normal range of motion and neck supple.  Lymphadenopathy:     Cervical: No cervical adenopathy.  Skin:    General: Skin is warm and dry.     Coloration: Skin is pale.  Neurological:     Comments: Eyes closed, not able to f/c, left sided weakness.      Labs reviewed: Recent  Labs    12/13/21 1157 12/14/21 0000 02/02/22 1202 03/07/22 0000 08/10/22 1102 09/01/22 1123 11/01/22 0000 11/08/22 0000  NA  --    < > 145   < > 147* 144 145 143  K  --    < > 3.6   < > 3.8 3.4* 4.0 4.7  CL  --    < > 107   < > 107 108 108 103  CO2  --    < > 28   < > 25 26 24* 23*  GLUCOSE  --    < > 126*  --  136* 158*  --   --   BUN  --    < > 32*   < > 31* 29* 36* 39*  CREATININE  --    < > 1.14   < > 1.10 1.06 1.1 1.4*  CALCIUM  --    < > 9.2   < > 9.4 9.0 8.6* 9.0  MG 2.4  --   --   --   --  2.5*  --   --    < > = values in this interval not displayed.   Recent Labs    01/03/22 1322 02/02/22 1202 06/07/22 0000 08/10/22 1102  AST 25 22 33 34  ALT 20 23 42* 54*  ALKPHOS 100  --  107  --   BILITOT 1.1 0.9  --  1.0  PROT 7.4 7.1  --  7.3  ALBUMIN 3.6  --  4.2  --    Recent Labs    02/02/22 1202 03/14/22 0000 08/10/22 1102 09/01/22 1123 10/10/22 0000  WBC 9.4   < > 9.0 9.3 8.9  NEUTROABS 6,110  --  6,759  --   --   HGB 9.0*   < > 12.2* 11.7* 11.5*  HCT 28.3*   < > 37.1* 36.7* 35*  MCV 86.0  --  93.0 96.1  --   PLT 261   < > 220 171 193   < > = values in this interval not displayed.   Lab Results  Component Value Date   TSH 1.74 07/05/2022   Lab Results  Component Value Date   HGBA1C 6.3 07/05/2022   Lab Results  Component Value Date   CHOL 98 06/14/2022   HDL 32 (A) 06/14/2022   LDLCALC 54 06/14/2022   TRIG 57 06/14/2022   CHOLHDL 2.8 08/08/2020    Significant Diagnostic Results in last 30 days:  No results found.  Assessment/Plan 1. Neurocognitive disorder Refill given per request, POA not ready to discontinue.   2. Lethargy Due to progressive cerebrovascular disease associated with CVA, also TBI, and seizure disorder Also he is not drinking well and is likely hyper natremic.   3. Dysphagia Oropharyngeal Working with therapy Worse over time with decreased intake DNR and no feeding tube per goals of care discussions  4.  Dehydration Tachycardia and dry mouth on exam Discussed that he is nearing end of life. She allowed me to discontinue the statin, vitamin, and change the miralax to every other day as he is having trouble taking in crushed pills. She was not ready to discontinue the cardiac meds or start morphine which has been discussed at length. I would not change the diuretic as he will likely just develop more distress. I asked the nurse to get some mouth swabs for dry mouth.   Family/ staff Communication: Romilda Joy ordered:  BMP weekly

## 2022-11-22 ENCOUNTER — Encounter: Payer: Self-pay | Admitting: Adult Health

## 2022-11-22 ENCOUNTER — Non-Acute Institutional Stay (SKILLED_NURSING_FACILITY): Payer: Medicare Other | Admitting: Internal Medicine

## 2022-11-22 ENCOUNTER — Encounter: Payer: Self-pay | Admitting: Internal Medicine

## 2022-11-22 DIAGNOSIS — R569 Unspecified convulsions: Secondary | ICD-10-CM

## 2022-11-22 DIAGNOSIS — I5022 Chronic systolic (congestive) heart failure: Secondary | ICD-10-CM | POA: Diagnosis not present

## 2022-11-22 DIAGNOSIS — I38 Endocarditis, valve unspecified: Secondary | ICD-10-CM

## 2022-11-22 DIAGNOSIS — E870 Hyperosmolality and hypernatremia: Secondary | ICD-10-CM | POA: Diagnosis not present

## 2022-11-22 DIAGNOSIS — N390 Urinary tract infection, site not specified: Secondary | ICD-10-CM

## 2022-11-22 DIAGNOSIS — I480 Paroxysmal atrial fibrillation: Secondary | ICD-10-CM | POA: Diagnosis not present

## 2022-11-22 DIAGNOSIS — R5383 Other fatigue: Secondary | ICD-10-CM | POA: Diagnosis not present

## 2022-11-22 DIAGNOSIS — T826XXD Infection and inflammatory reaction due to cardiac valve prosthesis, subsequent encounter: Secondary | ICD-10-CM | POA: Diagnosis not present

## 2022-11-22 DIAGNOSIS — R1312 Dysphagia, oropharyngeal phase: Secondary | ICD-10-CM | POA: Diagnosis not present

## 2022-11-22 DIAGNOSIS — N400 Enlarged prostate without lower urinary tract symptoms: Secondary | ICD-10-CM

## 2022-11-22 MED ORDER — MORPHINE SULFATE (CONCENTRATE) 20 MG/ML PO SOLN: 5.0000 mg | Freq: Four times a day (QID) | ORAL | 0 refills | Status: DC | PRN

## 2022-11-22 NOTE — Progress Notes (Signed)
Location:  [facility] Room Number: 127A Place of Service:  SNF [phone]) Provider:  Mahlon Gammon, MD   Mahlon Gammon, MD  Patient Care Team: Mahlon Gammon, MD as PCP - General (Internal Medicine) Laurey Morale, MD as PCP - Cardiology (Cardiology) Mia Creek, MD as Consulting Physician (Ophthalmology) Etta Grandchild, MD (Internal Medicine)  Extended Emergency Contact Information Primary Emergency Contact: Kae Heller Address: [address]          [place], [place] [address] [place] Home Phone: [phone] Mobile Phone: [phone] Relation: Spouse Secondary Emergency Contact: Phineas Inches, [place] [address] [place] Home Phone: [phone] Relation: Son  Code Status:  DNR Goals of care: Advanced Directive information    11/22/2022    3:39 PM  Advanced Directives  Does Patient Have a Medical Advance Directive? Yes  Type of Estate agent of [place];Living will;Out of facility DNR (pink MOST or yellow form)  Does patient want to make changes to medical advance directive? No - Patient declined  Copy of Healthcare Power of Attorney in Chart? Yes - validated most recent copy scanned in chart (See row information)     Chief Complaint  Patient presents with   Acute Visit    Patient is being seen for an acute visit    Immunizations    Patient is due for covid and flu vaccine    Quality Metric Gaps    Patient is due for AWV    HPI:  Pt is a 82 y.o. 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  Worsening in Past few days  Not able to swallow  Food coming out of the mouth Today not responding and is more lethargic Per Wife he did eat some this morning RR high  50 Tachycardia of 120-140 N o fever  When I went to see him he was not responding Apneic breathing continued Wife and Son in the room    Past Medical History:  Diagnosis Date   Acute rheumatic heart disease, unspecified    childhood, age  89 & 10   Acute rheumatic pericarditis    Atrial fibrillation (HCC)    history   CHF (congestive heart failure) (HCC)    Diverticulosis    Dysrhythmia    Enlarged aorta (HCC) 2019   External hemorrhoids without mention of complication    H/O aortic valve replacement    H/O mitral valve replacement    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   Lesion of ulnar nerve    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    Other and unspecified hyperlipidemia    Pre-diabetes    Previous back surgery 1978, jan 2007   Psychosexual dysfunction with inhibited sexual excitement    SOB (shortness of breath)    "with heavy exercise"   Stroke (HCC) 08/2013   "I WAS IN AFIB AND THREW A CLOT, THE EFFECTS WERE TRANSITORY AND DIDNT LAST BUT  FOR 30 MINUTES"    Thoracic aortic aneurysm [facility])    Past Surgical History:  Procedure Laterality Date   AORTIC AND MITRAL VALVE REPLACEMENT     09/2004   CARDIOVERSION     3 times from 2004-2006   CARDIOVERSION N/A 09/26/2013   Procedure: CARDIOVERSION;  Surgeon: Laurey Morale, MD;  Location: [facility] ENDOSCOPY;  Service: Cardiovascular;  Laterality: N/A;   CARDIOVERSION N/A 06/19/2014   Procedure: CARDIOVERSION;  Surgeon: Jake Bathe, MD;  Location: [facility] ENDOSCOPY;  Service: Cardiovascular;  Laterality: N/A;   CARDIOVERSION N/A 04/24/2017   Procedure: CARDIOVERSION;  Surgeon: Laurey Morale, MD;  Location: [facility] ENDOSCOPY;  Service: Cardiovascular;  Laterality:  N/A;   CARDIOVERSION N/A 06/08/2017   Procedure: CARDIOVERSION;  Surgeon: Chrystie Nose, MD;  Location: [facility] ENDOSCOPY;  Service: Cardiovascular;  Laterality: N/A;   CARDIOVERSION N/A 08/24/2017   Procedure: CARDIOVERSION;  Surgeon: Chilton Si, MD;  Location: [facility] ENDOSCOPY;  Service: Cardiovascular;  Laterality: N/A;   CARDIOVERSION N/A 02/14/2018   Procedure: CARDIOVERSION;  Surgeon: Laurey Morale, MD;  Location: [facility] ENDOSCOPY;  Service: Cardiovascular;  Laterality: N/A;   COLONOSCOPY     CRANIOTOMY N/A 07/28/2020   Procedure: CRANIOTOMY FOR EVACUATION OF SUBDURAL HEMATOMA;  Surgeon: Tia Alert, MD;  Location: [facility] OR;  Service: Neurosurgery;  Laterality: N/A;   IR ANGIO VERTEBRAL SEL SUBCLAVIAN INNOMINATE UNI R MOD SED  08/07/2020   IR CT HEAD LTD  08/07/2020   IR PERCUTANEOUS ART THROMBECTOMY/INFUSION INTRACRANIAL INC DIAG ANGIO  08/07/2020   laminectomies     10/1975 and in 03/2005   RADIOLOGY WITH ANESTHESIA N/A 08/07/2020   Procedure: IR WITH ANESTHESIA;  Surgeon: Julieanne Cotton, MD;  Location: [facility] OR;  Service: Radiology;  Laterality: N/A;   TEE WITHOUT CARDIOVERSION N/A 12/18/2020   Procedure: TRANSESOPHAGEAL ECHOCARDIOGRAM (TEE);  Surgeon: Laurey Morale, MD;  Location: [facility] ENDOSCOPY;  Service: Cardiovascular;  Laterality: N/A;   TONSILLECTOMY     1950   TOTAL HIP ARTHROPLASTY Right 04/03/2018   Procedure: TOTAL HIP ARTHROPLASTY ANTERIOR APPROACH;  Surgeon: Durene Romans, MD;  Location: [facility] ORS;  Service: Orthopedics;  Laterality: Right;  70 mins    Allergies  Allergen Reactions   Naproxen Hives   No [facility] Food Allergies Other (See Comments)    Scallops - distress, nausea and vomitting    Outpatient Encounter Medications as of 11/22/2022  Medication Sig   acetaminophen (TYLENOL) 325 MG tablet Take 650 mg by mouth 3 (three) times daily as needed for mild pain.   Amantadine HCl 100 MG tablet Take 100 mg by mouth 2 (two) times daily.   amoxicillin (AMOXIL) 500 MG  capsule Take 500 mg by mouth 2 (two) times daily.   apixaban (ELIQUIS) 5 MG TABS tablet Take 1 tablet (5 mg total) by mouth 2 (two) times daily.   Artificial Saliva (BIOTENE MOISTURIZING MOUTH MT) Use as directed 2 sprays in the mouth or throat in the morning, at noon, and at bedtime.   brivaracetam (BRIVIACT) 10 MG/ML solution Take 5 mLs (50 mg total) by mouth 2 (two) times daily.   diazePAM, 15 MG Dose, (VALTOCO 15 MG DOSE) 2 x 7.5 MG/0.1ML LQPK Place 7.5 mg into each nostril (total of 15 mg) for grand mal seizure lasting over 2 minutes. Can repeat once in 4 hours. Do Not use more than twice in 24 hours   dofetilide (TIKOSYN) 125 MCG capsule Take 3 capsules (375 mcg total) by mouth 2 (two) times daily.  finasteride (PROSCAR) 5 MG tablet Take 1 tablet (5 mg total) by mouth daily.   Glucerna (GLUCERNA) LIQD Take 237 mLs by mouth daily.   lacosamide (VIMPAT) 10 MG/ML oral solution Take 5 mLs (50 mg total) by mouth 2 (two) times daily.   magnesium gluconate (MAGONATE) 500 MG tablet Take 1 tablet (500 mg total) by mouth daily.   melatonin 5 MG TABS Take 5 mg by mouth at bedtime.   methenamine (MANDELAMINE) 1 g tablet Take 1,000 mg by mouth 2 (two) times daily.   methylphenidate (RITALIN) 5 MG tablet Take 1 tablet (5 mg total) by mouth 2 (two) times daily with breakfast and lunch. Take at 7 am and 12 pm   omeprazole (PRILOSEC) 20 MG capsule Take 20 mg by mouth daily.   OXYGEN Inhale into the lungs as directed. Oxygen at 2L [place] - titrate for sat > or = to 90%; May leave on for SOB Every Shift - PRN; PRN 1, PRN 2, PRN 3   Polyethyl Glycol-Propyl Glycol (SYSTANE) 0.4-0.3 % SOLN Place 2 drops into both eyes daily.   polyethylene glycol (MIRALAX / GLYCOLAX) 17 g packet Take 17 g by mouth every other day.   potassium chloride (KLOR-CON M) 10 MEQ tablet Take 20 mEq by mouth daily. At 8 am, see other listing for pm dosing   potassium chloride SA (KLOR-CON M) 20 MEQ tablet Take 20 mEq by mouth daily. 12 pm,  see other listing for am dosing   saline (AYR) GEL Place 1 Application into both nostrils in the morning and at bedtime.   UNABLE TO FIND BiPAP  Apply BiPAP @ Q[facility], Make sure no water or moisture in hose (use spare hose if necessary) Reapply if taken off. Special Instructions: Check hose for moisture/water, if present use spare hose [DX: Obstructive sleep apnea (adult) (pediatric)] At Bedtime; 09:00 PM - 10:00 PM   No facility-administered encounter medications on file as of 11/22/2022.    Review of Systems  Unable to perform ROS: Other    Immunization History  Administered Date(s) Administered   Fluad Quad(high Dose 65+) 12/06/2018, 12/09/2020, 12/10/2021   Influenza Whole 12/25/2008, 01/05/2010, 12/30/2011   Influenza, High Dose Seasonal PF 12/14/2016   Influenza,inj,Quad PF,6+ Mos 12/19/2013   Influenza,inj,quad, With Preservative 11/22/2017   Influenza-Unspecified 12/14/2015, 12/24/2019, 12/10/2021   Moderna Covid-19 Vaccine Bivalent Booster 42yrs & up 01/12/2021   Moderna SARS-COV2 Booster Vaccination 01/02/2020, 06/26/2020   Moderna Sars-Covid-2 Vaccination 04/08/2019, 05/05/2019, 01/10/2022   Pneumococcal Conjugate-13 12/19/2013   Pneumococcal Polysaccharide-23 09/24/2007, 05/22/2017   Rsv, Bivalent, Protein Subunit Rsvpref,pf Verdis Frederickson) 02/14/2022   Td 12/25/2008   Td (Adult), 2 Lf Tetanus Toxid, Preservative Free 12/25/2008   Tdap 03/22/2018   Zoster Recombinant(Shingrix) 07/16/2019, 10/28/2019   Zoster, Live 09/24/2007   Pertinent  Health Maintenance Due  Topic Date Due   INFLUENZA VACCINE  10/06/2022   FOOT EXAM  10/23/2022   OPHTHALMOLOGY EXAM  02/05/2023 (Originally 10/04/1950)   HEMOGLOBIN A1C  01/04/2023   Colonoscopy  Discontinued      10/13/2021    2:32 PM 02/02/2022   11:24 AM 04/20/2022    3:01 PM 10/26/2022   10:45 AM 11/16/2022    1:51 PM  Fall Risk  Falls in the past year? 0 0 0 Exclusion - non ambulatory Exclusion - non ambulatory  Was there an  injury with Fall?  0 0    Fall Risk Category Calculator  0 0    Fall Risk Category (Retired)  Low     (  RETIRED) Patient Fall Risk Level  High fall risk     Patient at Risk for Falls Due to  Impaired mobility     Fall risk Follow up  Falls evaluation completed      Functional Status Survey:    Vitals:   11/22/22 1537  BP: 102/73  Pulse: (!) 101  Resp: (!) 40  Temp: 97.6 F (36.4 C)  TempSrc: Temporal  SpO2: 98%  Weight: 137 lb 6.4 oz (62.3 kg)  Height: 5\' 7"  (1.702 m)   Body mass index is 21.52 kg/m. Physical Exam Vitals reviewed.  Constitutional:      Comments: Lethargic  HENT:     Head: Normocephalic.     Nose: Nose normal.     Mouth/Throat:     Mouth: Mucous membranes are moist.     Pharynx: Oropharynx is clear.  Eyes:     Pupils: Pupils are equal, round, and reactive to light.  Cardiovascular:     Rate and Rhythm: Tachycardia present. Rhythm irregular.     Pulses: Normal pulses.     Heart sounds: Murmur heard.  Pulmonary:     Effort: No respiratory distress.     Breath sounds: Normal breath sounds. No rales.  Abdominal:     General: Abdomen is flat. Bowel sounds are normal.     Palpations: Abdomen is soft.  Musculoskeletal:        General: No swelling.     Cervical back: Neck supple.  Skin:    General: Skin is warm.  Neurological:     Mental Status: He is alert.     Comments: Not responding  Psychiatric:        Mood and Affect: Mood normal.        Thought Content: Thought content normal.     Labs reviewed: Recent Labs    12/13/21 1157 12/14/21 0000 02/02/22 1202 03/07/22 0000 08/10/22 1102 09/01/22 1123 11/01/22 0000 11/08/22 0000  NA  --    < > 145   < > 147* 144 145 143  K  --    < > 3.6   < > 3.8 3.4* 4.0 4.7  CL  --    < > 107   < > 107 108 108 103  CO2  --    < > 28   < > 25 26 24* 23*  GLUCOSE  --    < > 126*  --  136* 158*  --   --   BUN  --    < > 32*   < > 31* 29* 36* 39*  CREATININE  --    < > 1.14   < > 1.10 1.06 1.1 1.4*   CALCIUM  --    < > 9.2   < > 9.4 9.0 8.6* 9.0  MG 2.4  --   --   --   --  2.5*  --   --    < > = values in this interval not displayed.   Recent Labs    01/03/22 1322 02/02/22 1202 06/07/22 0000 08/10/22 1102  AST 25 22 33 34  ALT 20 23 42* 54*  ALKPHOS 100  --  107  --   BILITOT 1.1 0.9  --  1.0  PROT 7.4 7.1  --  7.3  ALBUMIN 3.6  --  4.2  --    Recent Labs    02/02/22 1202 03/14/22 0000 08/10/22 1102 09/01/22 1123 10/10/22 0000  WBC 9.4   < >  9.0 9.3 8.9  NEUTROABS 6,110  --  6,759  --   --   HGB 9.0*   < > 12.2* 11.7* 11.5*  HCT 28.3*   < > 37.1* 36.7* 35*  MCV 86.0  --  93.0 96.1  --   PLT 261   < > 220 171 193   < > = values in this interval not displayed.   Lab Results  Component Value Date   TSH 1.74 07/05/2022   Lab Results  Component Value Date   HGBA1C 6.3 07/05/2022   Lab Results  Component Value Date   CHOL 98 06/14/2022   HDL 32 (A) 06/14/2022   LDLCALC 54 06/14/2022   TRIG 57 06/14/2022   CHOLHDL 2.8 08/08/2020    Significant Diagnostic Results in last 30 days:  No results found.  Assessment/Plan 1. Lethargy Labs came back and His Sodium is 156 which explains this worsening Lethargy  2. Hypernatremia Due to Dysphagia and reduced PO intake Told Wife it is natural Progression of his disease and would not recommend IV Fluids  3. Oropharyngeal dysphagia  Very Poor intake  4. PAF (paroxysmal atrial fibrillation) (HCC) Eliquis and Tikosyn to be continued for now  5. Chronic systolic CHF (congestive heart failure) (HCC) Discontinue Torsemide for now  6. Seizure (HCC) Continue Vimpat and Briviact  7. Prosthetic valve endocarditis, subsequent encounter Chronic Amoxicillin  8. Benign prostatic hyperplasia without lower urinary tract symptoms Proscar  9. Recurrent UTI Methenamine 10 Goals of Care with Wife and Son Wife still not very sure of Hospice I did write for Roxanol but she does not want it to started yet I also wrote to  stop Ritalin and Amantadine And Melatonin    Family/ staff Communication:   Labs/tests ordered:

## 2022-11-23 ENCOUNTER — Telehealth: Payer: Medicare Other | Admitting: Nurse Practitioner

## 2022-11-23 NOTE — Telephone Encounter (Signed)
Staff called and reported the patient's Morphine q6hr prn is not adequate in helping the patient's tachypnea RR 54, verbal order given to increase Morphine 5mg  to q2hr prn, f/u with his provider.

## 2022-12-02 ENCOUNTER — Telehealth (HOSPITAL_COMMUNITY): Payer: Self-pay | Admitting: Cardiology

## 2022-12-02 NOTE — Telephone Encounter (Signed)
Per this patients family, he has passed away on 12-Dec-2022. Thank you!    Chart updated

## 2022-12-06 DEATH — deceased

## 2022-12-07 ENCOUNTER — Encounter: Payer: Medicare Other | Admitting: Physical Medicine & Rehabilitation

## 2022-12-20 ENCOUNTER — Ambulatory Visit (HOSPITAL_COMMUNITY): Payer: Medicare Other

## 2022-12-27 ENCOUNTER — Ambulatory Visit: Payer: Medicare Other | Admitting: Neurology

## 2023-02-22 ENCOUNTER — Ambulatory Visit: Payer: Medicare Other | Admitting: Physical Medicine & Rehabilitation

## 2023-08-03 ENCOUNTER — Ambulatory Visit: Payer: Medicare Other | Admitting: Internal Medicine
